# Patient Record
Sex: Female | Born: 1950
Health system: Southern US, Community
[De-identification: ages and names within clinical notes are randomized; demographics above are authoritative.]

## PROBLEM LIST (undated history)

## (undated) DIAGNOSIS — Z952 Presence of prosthetic heart valve: Secondary | ICD-10-CM

## (undated) DIAGNOSIS — J45909 Unspecified asthma, uncomplicated: Secondary | ICD-10-CM

## (undated) DIAGNOSIS — J449 Chronic obstructive pulmonary disease, unspecified: Secondary | ICD-10-CM

## (undated) DIAGNOSIS — I2510 Atherosclerotic heart disease of native coronary artery without angina pectoris: Secondary | ICD-10-CM

## (undated) DIAGNOSIS — Z853 Personal history of malignant neoplasm of breast: Secondary | ICD-10-CM

## (undated) DIAGNOSIS — I10 Essential (primary) hypertension: Secondary | ICD-10-CM

## (undated) DIAGNOSIS — I509 Heart failure, unspecified: Secondary | ICD-10-CM

## (undated) DIAGNOSIS — C50919 Malignant neoplasm of unspecified site of unspecified female breast: Secondary | ICD-10-CM

## (undated) DIAGNOSIS — M1990 Unspecified osteoarthritis, unspecified site: Secondary | ICD-10-CM

## (undated) DIAGNOSIS — K219 Gastro-esophageal reflux disease without esophagitis: Secondary | ICD-10-CM

## (undated) DIAGNOSIS — E785 Hyperlipidemia, unspecified: Secondary | ICD-10-CM

## (undated) DIAGNOSIS — I2699 Other pulmonary embolism without acute cor pulmonale: Secondary | ICD-10-CM

## (undated) HISTORY — DX: Other pulmonary embolism without acute cor pulmonale: I26.99

## (undated) HISTORY — DX: Personal history of malignant neoplasm of breast: Z85.3

## (undated) HISTORY — DX: Presence of prosthetic heart valve: Z95.2

## (undated) HISTORY — DX: Hyperlipidemia, unspecified: E78.5

## (undated) HISTORY — PX: MECHANICAL AORTIC AND MITRAL VALVE REPLACEMENT: SHX2012

## (undated) HISTORY — PX: MITRAL VALVE REPLACEMENT: SHX147

## (undated) HISTORY — PX: MASTECTOMY: SHX3

---

## 1999-06-23 DIAGNOSIS — C50919 Malignant neoplasm of unspecified site of unspecified female breast: Secondary | ICD-10-CM

## 1999-06-23 HISTORY — PX: BREAST SURGERY: SHX581

## 1999-06-23 HISTORY — DX: Malignant neoplasm of unspecified site of unspecified female breast: C50.919

## 2014-11-25 DIAGNOSIS — J029 Acute pharyngitis, unspecified: Secondary | ICD-10-CM | POA: Diagnosis not present

## 2014-11-25 DIAGNOSIS — R05 Cough: Secondary | ICD-10-CM | POA: Diagnosis not present

## 2014-11-25 DIAGNOSIS — R069 Unspecified abnormalities of breathing: Secondary | ICD-10-CM | POA: Diagnosis not present

## 2014-11-25 DIAGNOSIS — Z720 Tobacco use: Secondary | ICD-10-CM | POA: Diagnosis not present

## 2014-11-25 DIAGNOSIS — Z79899 Other long term (current) drug therapy: Secondary | ICD-10-CM | POA: Diagnosis not present

## 2014-11-25 DIAGNOSIS — J440 Chronic obstructive pulmonary disease with acute lower respiratory infection: Secondary | ICD-10-CM | POA: Diagnosis not present

## 2014-11-25 DIAGNOSIS — J441 Chronic obstructive pulmonary disease with (acute) exacerbation: Secondary | ICD-10-CM | POA: Diagnosis not present

## 2014-11-25 DIAGNOSIS — J811 Chronic pulmonary edema: Secondary | ICD-10-CM | POA: Diagnosis not present

## 2014-11-25 DIAGNOSIS — F17200 Nicotine dependence, unspecified, uncomplicated: Secondary | ICD-10-CM | POA: Diagnosis not present

## 2014-11-25 DIAGNOSIS — J209 Acute bronchitis, unspecified: Secondary | ICD-10-CM | POA: Diagnosis not present

## 2014-11-25 DIAGNOSIS — R0602 Shortness of breath: Secondary | ICD-10-CM | POA: Diagnosis not present

## 2015-01-18 DIAGNOSIS — L03031 Cellulitis of right toe: Secondary | ICD-10-CM | POA: Diagnosis not present

## 2015-01-18 DIAGNOSIS — J45909 Unspecified asthma, uncomplicated: Secondary | ICD-10-CM | POA: Diagnosis not present

## 2015-01-18 DIAGNOSIS — S39012A Strain of muscle, fascia and tendon of lower back, initial encounter: Secondary | ICD-10-CM | POA: Diagnosis not present

## 2015-01-18 DIAGNOSIS — Z7901 Long term (current) use of anticoagulants: Secondary | ICD-10-CM | POA: Diagnosis not present

## 2015-01-18 DIAGNOSIS — Z952 Presence of prosthetic heart valve: Secondary | ICD-10-CM | POA: Diagnosis not present

## 2015-01-18 DIAGNOSIS — Z79899 Other long term (current) drug therapy: Secondary | ICD-10-CM | POA: Diagnosis not present

## 2015-01-18 DIAGNOSIS — Z880 Allergy status to penicillin: Secondary | ICD-10-CM | POA: Diagnosis not present

## 2015-01-18 DIAGNOSIS — E785 Hyperlipidemia, unspecified: Secondary | ICD-10-CM | POA: Diagnosis not present

## 2015-01-18 DIAGNOSIS — I10 Essential (primary) hypertension: Secondary | ICD-10-CM | POA: Diagnosis not present

## 2015-03-02 DIAGNOSIS — Z7901 Long term (current) use of anticoagulants: Secondary | ICD-10-CM | POA: Diagnosis not present

## 2015-03-02 DIAGNOSIS — L600 Ingrowing nail: Secondary | ICD-10-CM | POA: Diagnosis not present

## 2015-03-02 DIAGNOSIS — Z79899 Other long term (current) drug therapy: Secondary | ICD-10-CM | POA: Diagnosis not present

## 2015-03-02 DIAGNOSIS — F17210 Nicotine dependence, cigarettes, uncomplicated: Secondary | ICD-10-CM | POA: Diagnosis not present

## 2015-03-02 DIAGNOSIS — B351 Tinea unguium: Secondary | ICD-10-CM | POA: Diagnosis not present

## 2015-03-02 DIAGNOSIS — I4891 Unspecified atrial fibrillation: Secondary | ICD-10-CM | POA: Diagnosis not present

## 2015-03-02 DIAGNOSIS — I10 Essential (primary) hypertension: Secondary | ICD-10-CM | POA: Diagnosis not present

## 2015-03-02 DIAGNOSIS — Z880 Allergy status to penicillin: Secondary | ICD-10-CM | POA: Diagnosis not present

## 2015-03-06 DIAGNOSIS — L600 Ingrowing nail: Secondary | ICD-10-CM | POA: Diagnosis not present

## 2015-03-06 DIAGNOSIS — M79674 Pain in right toe(s): Secondary | ICD-10-CM | POA: Diagnosis not present

## 2015-03-19 DIAGNOSIS — M71071 Abscess of bursa, right ankle and foot: Secondary | ICD-10-CM | POA: Diagnosis not present

## 2015-04-09 DIAGNOSIS — R069 Unspecified abnormalities of breathing: Secondary | ICD-10-CM | POA: Diagnosis not present

## 2015-04-09 DIAGNOSIS — R0602 Shortness of breath: Secondary | ICD-10-CM | POA: Diagnosis not present

## 2015-04-10 DIAGNOSIS — I10 Essential (primary) hypertension: Secondary | ICD-10-CM | POA: Diagnosis not present

## 2015-04-10 DIAGNOSIS — R062 Wheezing: Secondary | ICD-10-CM | POA: Diagnosis not present

## 2015-04-10 DIAGNOSIS — Z7901 Long term (current) use of anticoagulants: Secondary | ICD-10-CM | POA: Diagnosis not present

## 2015-04-10 DIAGNOSIS — J9801 Acute bronchospasm: Secondary | ICD-10-CM | POA: Diagnosis not present

## 2015-04-10 DIAGNOSIS — Z79899 Other long term (current) drug therapy: Secondary | ICD-10-CM | POA: Diagnosis not present

## 2015-05-22 DIAGNOSIS — R0682 Tachypnea, not elsewhere classified: Secondary | ICD-10-CM | POA: Diagnosis not present

## 2015-05-22 DIAGNOSIS — J441 Chronic obstructive pulmonary disease with (acute) exacerbation: Secondary | ICD-10-CM | POA: Diagnosis not present

## 2015-05-22 DIAGNOSIS — I509 Heart failure, unspecified: Secondary | ICD-10-CM | POA: Diagnosis not present

## 2015-05-22 DIAGNOSIS — Z952 Presence of prosthetic heart valve: Secondary | ICD-10-CM | POA: Diagnosis not present

## 2015-05-22 DIAGNOSIS — Z8701 Personal history of pneumonia (recurrent): Secondary | ICD-10-CM | POA: Diagnosis not present

## 2015-05-22 DIAGNOSIS — Z7901 Long term (current) use of anticoagulants: Secondary | ICD-10-CM | POA: Diagnosis not present

## 2015-05-22 DIAGNOSIS — Z853 Personal history of malignant neoplasm of breast: Secondary | ICD-10-CM | POA: Diagnosis not present

## 2015-05-22 DIAGNOSIS — Z79899 Other long term (current) drug therapy: Secondary | ICD-10-CM | POA: Diagnosis not present

## 2015-05-22 DIAGNOSIS — I4581 Long QT syndrome: Secondary | ICD-10-CM | POA: Diagnosis not present

## 2015-05-22 DIAGNOSIS — R0902 Hypoxemia: Secondary | ICD-10-CM | POA: Diagnosis not present

## 2015-05-22 DIAGNOSIS — F17210 Nicotine dependence, cigarettes, uncomplicated: Secondary | ICD-10-CM | POA: Diagnosis not present

## 2015-05-22 DIAGNOSIS — R791 Abnormal coagulation profile: Secondary | ICD-10-CM | POA: Diagnosis not present

## 2015-05-22 DIAGNOSIS — R0600 Dyspnea, unspecified: Secondary | ICD-10-CM | POA: Diagnosis not present

## 2015-05-22 DIAGNOSIS — Z880 Allergy status to penicillin: Secondary | ICD-10-CM | POA: Diagnosis not present

## 2015-05-22 DIAGNOSIS — I10 Essential (primary) hypertension: Secondary | ICD-10-CM | POA: Diagnosis not present

## 2015-05-22 DIAGNOSIS — R0602 Shortness of breath: Secondary | ICD-10-CM | POA: Diagnosis not present

## 2015-05-22 DIAGNOSIS — E785 Hyperlipidemia, unspecified: Secondary | ICD-10-CM | POA: Diagnosis not present

## 2015-05-22 DIAGNOSIS — I4891 Unspecified atrial fibrillation: Secondary | ICD-10-CM | POA: Diagnosis not present

## 2015-06-22 DIAGNOSIS — R9431 Abnormal electrocardiogram [ECG] [EKG]: Secondary | ICD-10-CM | POA: Diagnosis not present

## 2015-06-22 DIAGNOSIS — D72829 Elevated white blood cell count, unspecified: Secondary | ICD-10-CM | POA: Diagnosis not present

## 2015-06-22 DIAGNOSIS — Z720 Tobacco use: Secondary | ICD-10-CM | POA: Diagnosis not present

## 2015-06-22 DIAGNOSIS — D473 Essential (hemorrhagic) thrombocythemia: Secondary | ICD-10-CM | POA: Diagnosis not present

## 2015-06-22 DIAGNOSIS — R0602 Shortness of breath: Secondary | ICD-10-CM | POA: Diagnosis not present

## 2015-06-22 DIAGNOSIS — J441 Chronic obstructive pulmonary disease with (acute) exacerbation: Secondary | ICD-10-CM | POA: Diagnosis not present

## 2015-06-22 DIAGNOSIS — R069 Unspecified abnormalities of breathing: Secondary | ICD-10-CM | POA: Diagnosis not present

## 2015-06-22 DIAGNOSIS — D649 Anemia, unspecified: Secondary | ICD-10-CM | POA: Diagnosis not present

## 2015-06-22 DIAGNOSIS — Z79899 Other long term (current) drug therapy: Secondary | ICD-10-CM | POA: Diagnosis not present

## 2015-06-22 DIAGNOSIS — R062 Wheezing: Secondary | ICD-10-CM | POA: Diagnosis not present

## 2015-06-22 DIAGNOSIS — Z7901 Long term (current) use of anticoagulants: Secondary | ICD-10-CM | POA: Diagnosis not present

## 2015-06-22 DIAGNOSIS — R0902 Hypoxemia: Secondary | ICD-10-CM | POA: Diagnosis not present

## 2015-06-22 DIAGNOSIS — E876 Hypokalemia: Secondary | ICD-10-CM | POA: Diagnosis not present

## 2015-07-03 ENCOUNTER — Emergency Department (HOSPITAL_COMMUNITY)
Admission: EM | Admit: 2015-07-03 | Discharge: 2015-07-03 | Disposition: A | Discharge status: Home / Self Care | Payer: Medicare Other | Attending: Emergency Medicine | Admitting: Emergency Medicine

## 2015-07-03 ENCOUNTER — Encounter (HOSPITAL_COMMUNITY): Payer: Self-pay | Admitting: *Deleted

## 2015-07-03 DIAGNOSIS — M5441 Lumbago with sciatica, right side: Secondary | ICD-10-CM | POA: Diagnosis not present

## 2015-07-03 DIAGNOSIS — Z853 Personal history of malignant neoplasm of breast: Secondary | ICD-10-CM | POA: Diagnosis not present

## 2015-07-03 DIAGNOSIS — I509 Heart failure, unspecified: Secondary | ICD-10-CM | POA: Insufficient documentation

## 2015-07-03 DIAGNOSIS — I10 Essential (primary) hypertension: Secondary | ICD-10-CM | POA: Insufficient documentation

## 2015-07-03 DIAGNOSIS — I2510 Atherosclerotic heart disease of native coronary artery without angina pectoris: Secondary | ICD-10-CM | POA: Diagnosis not present

## 2015-07-03 DIAGNOSIS — F17210 Nicotine dependence, cigarettes, uncomplicated: Secondary | ICD-10-CM | POA: Diagnosis not present

## 2015-07-03 DIAGNOSIS — J449 Chronic obstructive pulmonary disease, unspecified: Secondary | ICD-10-CM | POA: Insufficient documentation

## 2015-07-03 DIAGNOSIS — M545 Low back pain: Secondary | ICD-10-CM | POA: Diagnosis present

## 2015-07-03 HISTORY — DX: Heart failure, unspecified: I50.9

## 2015-07-03 HISTORY — DX: Malignant neoplasm of unspecified site of unspecified female breast: C50.919

## 2015-07-03 HISTORY — DX: Chronic obstructive pulmonary disease, unspecified: J44.9

## 2015-07-03 HISTORY — DX: Essential (primary) hypertension: I10

## 2015-07-03 HISTORY — DX: Atherosclerotic heart disease of native coronary artery without angina pectoris: I25.10

## 2015-07-03 MED ORDER — TRAMADOL HCL 50 MG PO TABS
50.0000 mg | ORAL_TABLET | Freq: Once | ORAL | Status: AC
  Administered 2015-07-03: 50 mg via ORAL
  Filled 2015-07-03: qty 1

## 2015-07-03 NOTE — ED Notes (Addendum)
Pt reports waking up this am with right lower back pain, denies injury but does have hx of back pain. Radiates down her legs, more severe in right leg. Ambulatory at triage. Denies incontinence, denies urinary symptoms.

## 2015-07-03 NOTE — Discharge Instructions (Signed)
Please read attached information. If you experience any new or worsening signs or symptoms please return to the emergency room for evaluation. Please follow-up with your primary care provider or specialist as discussed. Please use medication prescribed only as directed and discontinue taking if you have any concerning signs or symptoms.   °

## 2015-07-03 NOTE — ED Provider Notes (Signed)
CSN: [id number]     Arrival date & time 07/03/15  1209 History  By signing my name below, I, Stephania Fragmin, attest that this documentation has been prepared under the direction and in the presence of [employer], PA-C. Electronically Signed: Stephania Fragmin, ED Scribe. 07/03/2015. 3:32 PM.     Chief Complaint  Patient presents with  . Back Pain   The history is provided by the patient. No language interpreter was used.   HPI Comments: Kim Anthony is a 65 y.o. female with a history of breast cancer, COPD, CAD, CHF, and HTN, who presents to the Emergency Department complaining of atraumatic, constant, aching, 9/10 right lower back pain since waking up this morning, although she has had the same intermittent pain for "a while." She states if she stands for 15-20 minute, the pain starts to radiate either one leg or both of her legs. Patient is able to ambulate with increased discomfort. She had tried lying down to alleviate her pain, with no relief.  Patient has been evaluated for back pain before and states this feels similar to her past back pain. Patient states she used to take Vicodin and Percocet for her back pain with moderate relief in the past, prescribed to her at an ED in [place], although she has since run out. She denies a history of any kidney conditions. She denies abdominal pain.   Past Medical History  Diagnosis Date  . Breast cancer ([place])   . Coronary artery disease   . COPD (chronic obstructive pulmonary disease) ([place])   . CHF (congestive heart failure) ([place])   . Hypertension    History reviewed. No pertinent past surgical history. History reviewed. No pertinent family history. Social History  Substance Use Topics  . Smoking status: Current Every Day Smoker    Types: Cigarettes  . Smokeless tobacco: None  . Alcohol Use: Yes     Comment: beer   OB History    No data available     Review of Systems  A complete 10 system review of systems was obtained and all systems are  negative except as noted in the HPI and PMH.    Allergies  Review of patient's allergies indicates no known allergies.  Home Medications   Prior to Admission medications   Not on File   BP 148/88 mmHg  Pulse 82  Temp(Src) 98.9 F (37.2 C) (Oral)  Resp 20  SpO2 100% Physical Exam  Constitutional: She is oriented to person, place, and time. She appears well-developed and well-nourished. No distress.  HENT:  Head: Normocephalic.  Neck: Normal range of motion. Neck supple.  Pulmonary/Chest: Effort normal.  Musculoskeletal: Normal range of motion. She exhibits tenderness. She exhibits no edema.  TTP over right lateral soft tissue and superior aspect of the gluteus. No C, T, or L spine tenderness to palpation. No obvious signs of trauma, deformity, infection, step-offs. Lung expansion normal. No scoliosis or kyphosis. Bilateral lower extremity strength 5 out of 5, sensation grossly intact, patellar reflexes 2+, pedal pulses 2+, Refill less than 3 seconds.    Straight leg negative Ambulates with steady gait  Neurological: She is alert and oriented to person, place, and time.  Skin: Skin is warm and dry. She is not diaphoretic.  Psychiatric: She has a normal mood and affect. Her behavior is normal. Judgment and thought content normal.  Nursing note and vitals reviewed.   ED Course  Procedures (including critical care time)  DIAGNOSTIC STUDIES: Oxygen Saturation is 98%  on RA, normal by my interpretation.    COORDINATION OF CARE: 3:32 PM - Discussed treatment plan with pt at bedside which includes referral to PCP. Cautioned patient to be careful when leaning forward while standing or straining herself while holding her soon-to-be infant granddaughter. Also advised back exercises, stretching, rest, ice, heat, and Tylenol. Pt verbalized understanding and agreed to plan.   MDM   Final diagnoses:  Right-sided low back pain with right-sided sciatica    Labs:  Imaging:  Consults:  Therapeutics: Tramadol 50 mg, once  Discharge Meds:   Assessment/Plan:  Patient with back pain.  No neurological deficits and normal neuro exam; no red flags. Patient can walk but states is painful.  No loss of bowel or bladder control.  No concern for cauda equina.  No fever, night sweats, weight loss, h/o cancer, IVDU.  RICE protocol and pain medicine (Tramadol administered once here, Tylenol at home) indicated and discussed with patient.    I personally performed the services described in this documentation, which was scribed in my presence. The recorded information has been reviewed and is accurate.     Okey Regal, PA-C 07/03/15 1532  Charlesetta Shanks, MD 07/04/15 1344

## 2015-07-10 ENCOUNTER — Encounter (HOSPITAL_COMMUNITY): Payer: Self-pay

## 2015-07-10 ENCOUNTER — Inpatient Hospital Stay (HOSPITAL_COMMUNITY)
Admission: EM | Admit: 2015-07-10 | Discharge: 2015-07-19 | DRG: 176 | Disposition: A | Discharge status: Home / Self Care | Payer: Medicare Other | Attending: Internal Medicine | Admitting: Internal Medicine

## 2015-07-10 ENCOUNTER — Emergency Department (HOSPITAL_COMMUNITY): Payer: Medicare Other

## 2015-07-10 DIAGNOSIS — R0602 Shortness of breath: Secondary | ICD-10-CM | POA: Diagnosis not present

## 2015-07-10 DIAGNOSIS — E876 Hypokalemia: Secondary | ICD-10-CM | POA: Diagnosis present

## 2015-07-10 DIAGNOSIS — Z952 Presence of prosthetic heart valve: Secondary | ICD-10-CM

## 2015-07-10 DIAGNOSIS — Z7901 Long term (current) use of anticoagulants: Secondary | ICD-10-CM

## 2015-07-10 DIAGNOSIS — I2510 Atherosclerotic heart disease of native coronary artery without angina pectoris: Secondary | ICD-10-CM | POA: Diagnosis present

## 2015-07-10 DIAGNOSIS — D649 Anemia, unspecified: Secondary | ICD-10-CM | POA: Diagnosis present

## 2015-07-10 DIAGNOSIS — J449 Chronic obstructive pulmonary disease, unspecified: Secondary | ICD-10-CM | POA: Diagnosis not present

## 2015-07-10 DIAGNOSIS — Z86718 Personal history of other venous thrombosis and embolism: Secondary | ICD-10-CM

## 2015-07-10 DIAGNOSIS — I50814 Right heart failure due to left heart failure: Secondary | ICD-10-CM

## 2015-07-10 DIAGNOSIS — I110 Hypertensive heart disease with heart failure: Secondary | ICD-10-CM | POA: Diagnosis not present

## 2015-07-10 DIAGNOSIS — Z23 Encounter for immunization: Secondary | ICD-10-CM

## 2015-07-10 DIAGNOSIS — I2699 Other pulmonary embolism without acute cor pulmonale: Principal | ICD-10-CM | POA: Diagnosis present

## 2015-07-10 DIAGNOSIS — I5040 Unspecified combined systolic (congestive) and diastolic (congestive) heart failure: Secondary | ICD-10-CM

## 2015-07-10 DIAGNOSIS — J811 Chronic pulmonary edema: Secondary | ICD-10-CM | POA: Diagnosis not present

## 2015-07-10 DIAGNOSIS — Z853 Personal history of malignant neoplasm of breast: Secondary | ICD-10-CM | POA: Diagnosis not present

## 2015-07-10 DIAGNOSIS — R05 Cough: Secondary | ICD-10-CM | POA: Diagnosis not present

## 2015-07-10 DIAGNOSIS — Z8249 Family history of ischemic heart disease and other diseases of the circulatory system: Secondary | ICD-10-CM

## 2015-07-10 DIAGNOSIS — I5042 Chronic combined systolic (congestive) and diastolic (congestive) heart failure: Secondary | ICD-10-CM | POA: Diagnosis present

## 2015-07-10 DIAGNOSIS — E785 Hyperlipidemia, unspecified: Secondary | ICD-10-CM | POA: Diagnosis present

## 2015-07-10 DIAGNOSIS — R739 Hyperglycemia, unspecified: Secondary | ICD-10-CM | POA: Diagnosis present

## 2015-07-10 DIAGNOSIS — F17210 Nicotine dependence, cigarettes, uncomplicated: Secondary | ICD-10-CM | POA: Diagnosis not present

## 2015-07-10 DIAGNOSIS — I5043 Acute on chronic combined systolic (congestive) and diastolic (congestive) heart failure: Secondary | ICD-10-CM

## 2015-07-10 DIAGNOSIS — G43009 Migraine without aura, not intractable, without status migrainosus: Secondary | ICD-10-CM | POA: Diagnosis present

## 2015-07-10 LAB — CBC
HCT: 35.2 % — ABNORMAL LOW (ref 36.0–46.0)
Hemoglobin: 11.8 g/dL — ABNORMAL LOW (ref 12.0–15.0)
MCH: 32.1 pg (ref 26.0–34.0)
MCHC: 33.5 g/dL (ref 30.0–36.0)
MCV: 95.7 fL (ref 78.0–100.0)
PLATELETS: 318 10*3/uL (ref 150–400)
RBC: 3.68 MIL/uL — ABNORMAL LOW (ref 3.87–5.11)
RDW: 12.2 % (ref 11.5–15.5)
WBC: 8.9 10*3/uL (ref 4.0–10.5)

## 2015-07-10 LAB — I-STAT TROPONIN, ED: Troponin i, poc: 0.01 ng/mL (ref 0.00–0.08)

## 2015-07-10 LAB — BASIC METABOLIC PANEL
ANION GAP: 13 (ref 5–15)
BUN: 8 mg/dL (ref 6–20)
CALCIUM: 9.4 mg/dL (ref 8.9–10.3)
CO2: 23 mmol/L (ref 22–32)
Chloride: 105 mmol/L (ref 101–111)
Creatinine, Ser: 0.71 mg/dL (ref 0.44–1.00)
GFR calc Af Amer: >60 mL/min (ref >60)
GLUCOSE: 108 mg/dL — AB (ref 65–99)
Potassium: 2.9 mmol/L — ABNORMAL LOW (ref 3.5–5.1)
Sodium: 141 mmol/L (ref 135–145)

## 2015-07-10 LAB — PROTIME-INR
INR: 1.1 (ref 0.00–1.49)
Prothrombin Time: 14.4 seconds (ref 11.6–15.2)

## 2015-07-10 LAB — BRAIN NATRIURETIC PEPTIDE: B Natriuretic Peptide: 233.4 pg/mL — ABNORMAL HIGH (ref 0.0–100.0)

## 2015-07-10 NOTE — ED Provider Notes (Signed)
CSN: [id number]     Arrival date & time 07/10/15  2112 History    By signing my name below, I, Rowan Blase, attest that this documentation has been prepared under the direction and in the presence of Orpah Greek, MD . Electronically Signed: Rowan Blase, Scribe. 07/10/2015. 11:53 PM.    Chief Complaint  Patient presents with  . Shortness of Breath   The history is provided by the patient. No language interpreter was used.   HPI Comments:  Kim Anthony is a 65 y.o. female with a Hx of COPD and asthma who presents to the Emergency Department complaining of SOB onset earlier today. Pt reports associated wheezing and chest tightness. She used her inhaler twice today with no improvement.  Pt also states she ran out of Lasix 5 days ago. She currently takes Coumadin for one past blood clot in 2001 when being treated for breast CA. She is a current every day smoker. Pt denies coughing.   Past Medical History  Diagnosis Date  . Breast cancer ([place])   . Coronary artery disease   . COPD (chronic obstructive pulmonary disease) ([place])   . CHF (congestive heart failure) ([place])   . Hypertension    History reviewed. No pertinent past surgical history. No family history on file. Social History  Substance Use Topics  . Smoking status: Current Every Day Smoker    Types: Cigarettes  . Smokeless tobacco: None  . Alcohol Use: Yes     Comment: beer   OB History    No data available     Review of Systems  Respiratory: Positive for chest tightness, shortness of breath and wheezing. Negative for cough.   All other systems reviewed and are negative.   Allergies  Acetaminophen-codeine; Other; and Tramadol  Home Medications   Prior to Admission medications   Medication Sig Start Date End Date Taking? Authorizing Provider  lisinopril (PRINIVIL,ZESTRIL) 10 MG tablet Take 1 tablet by mouth daily. 05/23/15  Yes Historical Provider, MD  furosemide (LASIX) 40 MG tablet Take 1 tablet by  mouth daily. 06/29/15   Historical Provider, MD  simvastatin (ZOCOR) 20 MG tablet Take 1 tablet by mouth daily. 06/29/15   Historical Provider, MD  warfarin (COUMADIN) 4 MG tablet Take 1 tablet by mouth daily. 06/29/15   Historical Provider, MD   BP 172/94 mmHg  Pulse 70  Temp(Src) 98 F (36.7 C) (Oral)  Resp 21  SpO2 98% Physical Exam  Constitutional: She is oriented to person, place, and time. She appears well-developed and well-nourished. No distress.  HENT:  Head: Normocephalic and atraumatic.  Right Ear: Hearing normal.  Left Ear: Hearing normal.  Nose: Nose normal.  Mouth/Throat: Oropharynx is clear and moist and mucous membranes are normal.  Eyes: Conjunctivae and EOM are normal. Pupils are equal, round, and reactive to light.  Neck: Normal range of motion. Neck supple.  Cardiovascular: Regular rhythm, S1 normal and S2 normal.  Exam reveals no gallop and no friction rub.   No murmur heard. Pulmonary/Chest: Effort normal. No respiratory distress. She has rales. She exhibits no tenderness.  Diminished breath sounds with crackles at bases.  Abdominal: Soft. Normal appearance and bowel sounds are normal. There is no hepatosplenomegaly. There is no tenderness. There is no rebound, no guarding, no tenderness at McBurney's point and negative Murphy's sign. No hernia.  Musculoskeletal: Normal range of motion.  Neurological: She is alert and oriented to person, place, and time. She has normal strength. No cranial nerve deficit or  sensory deficit. Coordination normal. GCS eye subscore is 4. GCS verbal subscore is 5. GCS motor subscore is 6.  Skin: Skin is warm, dry and intact. No rash noted. No cyanosis.  Psychiatric: She has a normal mood and affect. Her speech is normal and behavior is normal. Thought content normal.  Nursing note and vitals reviewed.  ED Course  Procedures  DIAGNOSTIC STUDIES:  Oxygen Saturation is 99% on room air, normal by my interpretation.    COORDINATION OF  CARE:  11:34 PM Ordered Chest Xray. Discussed treatment plan with pt at bedside and pt agreed to plan.  Labs Review Labs Reviewed  BASIC METABOLIC PANEL - Abnormal; Notable for the following:    Potassium 2.9 (*)    Glucose, Bld 108 (*)    All other components within normal limits  CBC - Abnormal; Notable for the following:    RBC 3.68 (*)    Hemoglobin 11.8 (*)    HCT 35.2 (*)    All other components within normal limits  BRAIN NATRIURETIC PEPTIDE - Abnormal; Notable for the following:    B Natriuretic Peptide 233.4 (*)    All other components within normal limits  PROTIME-INR  Randolm Idol, ED    Imaging Review Dg Chest 2 View  07/10/2015  CLINICAL DATA:  65 year old female with shortness of breath and cough EXAM: CHEST  2 VIEW COMPARISON:  None. FINDINGS: Two views of the chest demonstrate emphysematous changes of the lungs. There is diffuse interstitial prominence, likely related to underlying chronic lung disease. There is no focal consolidation, pleural effusion, or pneumothorax. Mild cardiomegaly. Median sternotomy wires and mechanical cardiac valve noted. There is osteopenia with degenerative changes of the spine. No acute osseous pathology. Left axillary surgical clips. IMPRESSION: Emphysema.  No focal consolidation or pneumothorax. Electronically Signed   By: Anner Crete M.D.   On: 07/10/2015 21:33   Ct Angio Chest Pe W/cm &/or Wo Cm  07/11/2015  CLINICAL DATA:  65 year old female with shortness of breath and mid chest pain EXAM: CT ANGIOGRAPHY CHEST WITH CONTRAST TECHNIQUE: Multidetector CT imaging of the chest was performed using the standard protocol during bolus administration of intravenous contrast. Multiplanar CT image reconstructions and MIPs were obtained to evaluate the vascular anatomy. CONTRAST:  64mL OMNIPAQUE IOHEXOL 350 MG/ML SOLN COMPARISON:  Radiograph dated 07/10/2015 FINDINGS: Evaluation of this exam is limited due to respiratory motion artifact.  Evaluation is also limited due to streak artifact caused by patient's arms. There is emphysematous changes of the lungs with diffuse interstitial prominence which may represent a degree of congestive changes. There is no pleural effusion or pneumothorax. The central airways are patent. The thoracic aorta appears unremarkable. Evaluation of the pulmonary arteries is limited due to suboptimal opacification of peripheral branches. There is abrupt termination of contrast in the subsegmental branch of the left upper lobe pulmonary artery (series 406, image 72 and coronal series 402 image 86) compatible with pulmonary embolism. A 3.0 x 3.0 cm area of pleural based density at the left a packs is concerning for a focal pulmonary infarct. A small pleural based density in the lingula noted with apparent " Feeding vessel sign" and is also suspicious for a focal area of infarct. No large embolus noted in the lingula, however ; a small distal branch embolus is not excluded. V/Q scan may provide additional information if clinically indicated. No definite large central pulmonary artery embolus identified. Mild cardiomegaly. Mechanical mitral valve. There is coronary vascular calcification. There is dilatation of the  right cardiac chambers with retrograde flow of contrast into the IVC compatible with a degree of right cardiac dysfunction. Underlying right heart straining is not excluded. Correlation with clinical exam and echocardiogram recommended. Top-normal hilar and mediastinal lymph nodes. The esophagus is unremarkable. Left mastectomy changes. Left axillary surgical clips. There is median sternotomy wires. Degenerative changes of the spine. T8 probable hemangioma. No acute fracture. The visualized upper abdomen is unremarkable. Review of the MIP images confirms the above findings. IMPRESSION: Small subsegmental left upper lobe pulmonary embolus with findings suspicious for probable small embolus in the distal branches of the  lingula. Left apical and lingular subpleural densities are concerning for pulmonary infarct. Clinical correlation is recommended. V/Q scan may provide additional information. Mild dilatation of the right cardiac chambers likely related to underlying right cardiac dysfunction. Right heart straining is not excluded. Correlation with clinical exam and echocardiogram recommended. These results were called by telephone at the time of interpretation on 07/11/2015 at 6:11 am to Dr. Joseph Berkshire , who verbally acknowledged these results. Electronically Signed   By: Anner Crete M.D.   On: 07/11/2015 06:15   I have personally reviewed and evaluated these images and lab results as part of my medical decision-making.   EKG Interpretation   Date/Time:  Wednesday July 10 2015 21:22:01 EST Ventricular Rate:  81 PR Interval:  144 QRS Duration: 104 QT Interval:  426 QTC Calculation: 494 R Axis:   0 Text Interpretation:  Normal sinus rhythm ST \\T \ T wave abnormality,  consider lateral ischemia Prolonged QT Abnormal ECG No previous tracing  Confirmed by POLLINA  MD, Annetta North [id number]) on 07/10/2015 11:30:40 PM      MDM   Final diagnoses:  PE (pulmonary embolism)  Pulmonary infarct ([place])   Presents to the emergency department for evaluation of shortness of breath. Patient does have a history of COPD and asthma. She did not, however, have significant wheezing present on arrival. She is not hypoxic. Patient also reports a previous history of DVT and takes Lasix. She has run out of her Coumadin and Lasix recently. Her INR is severely subtherapeutic. Stopped this, PE was also considered. There is no evidence of congestive heart failure. Patient underwent CT angiography and this did show evidence of pulmonary infarct and PE. No evidence of right heart strain.  I personally performed the services described in this documentation, which was scribed in my presence. The recorded information has been  reviewed and is accurate.    Orpah Greek, MD 07/11/15 [phone]

## 2015-07-10 NOTE — ED Notes (Signed)
Pt here for SOB, hx of COPD and asthma. Lungs sounds clear. RR unlabored, pt speaking clear complete sentences. States she ran out of her lasix, zocor and coumadin. Pt reports she gave herself 2 at home breathing treatments today.

## 2015-07-11 ENCOUNTER — Encounter (HOSPITAL_COMMUNITY): Payer: Self-pay | Admitting: Family Medicine

## 2015-07-11 ENCOUNTER — Emergency Department (HOSPITAL_COMMUNITY): Payer: Medicare Other

## 2015-07-11 DIAGNOSIS — E785 Hyperlipidemia, unspecified: Secondary | ICD-10-CM

## 2015-07-11 DIAGNOSIS — I5043 Acute on chronic combined systolic (congestive) and diastolic (congestive) heart failure: Secondary | ICD-10-CM

## 2015-07-11 DIAGNOSIS — Z23 Encounter for immunization: Secondary | ICD-10-CM | POA: Diagnosis not present

## 2015-07-11 DIAGNOSIS — Z853 Personal history of malignant neoplasm of breast: Secondary | ICD-10-CM | POA: Diagnosis not present

## 2015-07-11 DIAGNOSIS — Z86718 Personal history of other venous thrombosis and embolism: Secondary | ICD-10-CM | POA: Diagnosis not present

## 2015-07-11 DIAGNOSIS — I2699 Other pulmonary embolism without acute cor pulmonale: Principal | ICD-10-CM

## 2015-07-11 DIAGNOSIS — Z8249 Family history of ischemic heart disease and other diseases of the circulatory system: Secondary | ICD-10-CM | POA: Diagnosis not present

## 2015-07-11 DIAGNOSIS — D649 Anemia, unspecified: Secondary | ICD-10-CM | POA: Diagnosis present

## 2015-07-11 DIAGNOSIS — G43009 Migraine without aura, not intractable, without status migrainosus: Secondary | ICD-10-CM

## 2015-07-11 DIAGNOSIS — Z952 Presence of prosthetic heart valve: Secondary | ICD-10-CM | POA: Diagnosis not present

## 2015-07-11 DIAGNOSIS — I110 Hypertensive heart disease with heart failure: Secondary | ICD-10-CM | POA: Diagnosis not present

## 2015-07-11 DIAGNOSIS — I5042 Chronic combined systolic (congestive) and diastolic (congestive) heart failure: Secondary | ICD-10-CM

## 2015-07-11 DIAGNOSIS — I509 Heart failure, unspecified: Secondary | ICD-10-CM

## 2015-07-11 DIAGNOSIS — Z954 Presence of other heart-valve replacement: Secondary | ICD-10-CM | POA: Diagnosis not present

## 2015-07-11 DIAGNOSIS — E876 Hypokalemia: Secondary | ICD-10-CM

## 2015-07-11 DIAGNOSIS — R739 Hyperglycemia, unspecified: Secondary | ICD-10-CM | POA: Diagnosis present

## 2015-07-11 DIAGNOSIS — G43001 Migraine without aura, not intractable, with status migrainosus: Secondary | ICD-10-CM | POA: Diagnosis not present

## 2015-07-11 DIAGNOSIS — F17210 Nicotine dependence, cigarettes, uncomplicated: Secondary | ICD-10-CM | POA: Diagnosis not present

## 2015-07-11 DIAGNOSIS — I10 Essential (primary) hypertension: Secondary | ICD-10-CM | POA: Diagnosis not present

## 2015-07-11 DIAGNOSIS — R0602 Shortness of breath: Secondary | ICD-10-CM | POA: Diagnosis not present

## 2015-07-11 DIAGNOSIS — I5020 Unspecified systolic (congestive) heart failure: Secondary | ICD-10-CM | POA: Diagnosis not present

## 2015-07-11 DIAGNOSIS — I50814 Right heart failure due to left heart failure: Secondary | ICD-10-CM

## 2015-07-11 DIAGNOSIS — Z7901 Long term (current) use of anticoagulants: Secondary | ICD-10-CM | POA: Diagnosis not present

## 2015-07-11 DIAGNOSIS — I2510 Atherosclerotic heart disease of native coronary artery without angina pectoris: Secondary | ICD-10-CM | POA: Diagnosis present

## 2015-07-11 DIAGNOSIS — J449 Chronic obstructive pulmonary disease, unspecified: Secondary | ICD-10-CM | POA: Diagnosis not present

## 2015-07-11 DIAGNOSIS — I5040 Unspecified combined systolic (congestive) and diastolic (congestive) heart failure: Secondary | ICD-10-CM

## 2015-07-11 DIAGNOSIS — G43019 Migraine without aura, intractable, without status migrainosus: Secondary | ICD-10-CM | POA: Diagnosis not present

## 2015-07-11 HISTORY — DX: Migraine without aura, not intractable, without status migrainosus: G43.009

## 2015-07-11 LAB — MAGNESIUM: Magnesium: 1.9 mg/dL (ref 1.7–2.4)

## 2015-07-11 LAB — PHOSPHORUS: Phosphorus: 2.8 mg/dL (ref 2.5–4.6)

## 2015-07-11 MED ORDER — SODIUM CHLORIDE 0.9 % IV SOLN: 250.0000 mL | INTRAVENOUS | Status: DC | PRN

## 2015-07-11 MED ORDER — HEPARIN (PORCINE) IN NACL 100-0.45 UNIT/ML-% IJ SOLN
1100.0000 [IU]/h | INTRAMUSCULAR | Status: AC
  Filled 2015-07-11: qty 250

## 2015-07-11 MED ORDER — HEPARIN BOLUS VIA INFUSION
4000.0000 [IU] | Freq: Once | INTRAVENOUS | Status: AC
  Administered 2015-07-11: 4000 [IU] via INTRAVENOUS
  Filled 2015-07-11: qty 4000

## 2015-07-11 MED ORDER — HEPARIN (PORCINE) IN NACL 100-0.45 UNIT/ML-% IJ SOLN
1100.0000 [IU]/h | INTRAMUSCULAR | Status: DC
  Administered 2015-07-11: 1100 [IU]/h via INTRAVENOUS
  Filled 2015-07-11: qty 250

## 2015-07-11 MED ORDER — OXYCODONE HCL 5 MG PO TABS
5.0000 mg | ORAL_TABLET | Freq: Two times a day (BID) | ORAL | Status: DC | PRN
  Administered 2015-07-11 – 2015-07-13 (×4): 5 mg via ORAL
  Filled 2015-07-11 (×5): qty 1

## 2015-07-11 MED ORDER — ALBUTEROL SULFATE (2.5 MG/3ML) 0.083% IN NEBU: 2.5000 mg | INHALATION_SOLUTION | RESPIRATORY_TRACT | Status: AC | PRN

## 2015-07-11 MED ORDER — ACETAMINOPHEN 650 MG RE SUPP: 650.0000 mg | Freq: Four times a day (QID) | RECTAL | Status: DC | PRN

## 2015-07-11 MED ORDER — ONDANSETRON HCL 4 MG/2ML IJ SOLN: 4.0000 mg | Freq: Four times a day (QID) | INTRAMUSCULAR | Status: DC | PRN

## 2015-07-11 MED ORDER — ENOXAPARIN SODIUM 80 MG/0.8ML ~~LOC~~ SOLN
1.0000 mg/kg | Freq: Two times a day (BID) | SUBCUTANEOUS | Status: DC
  Administered 2015-07-11 – 2015-07-12 (×2): 75 mg via SUBCUTANEOUS
  Filled 2015-07-11 (×2): qty 0.8

## 2015-07-11 MED ORDER — WARFARIN - PHARMACIST DOSING INPATIENT
Freq: Every day | Status: DC
  Administered 2015-07-12 – 2015-07-14 (×2)

## 2015-07-11 MED ORDER — SODIUM CHLORIDE 0.9 % IJ SOLN: 3.0000 mL | INTRAMUSCULAR | Status: DC | PRN

## 2015-07-11 MED ORDER — POTASSIUM CHLORIDE 10 MEQ/100ML IV SOLN
10.0000 meq | INTRAVENOUS | Status: AC
Start: 2015-07-11 — End: 2015-07-11
  Administered 2015-07-11 (×4): 10 meq via INTRAVENOUS
  Filled 2015-07-11 (×4): qty 100

## 2015-07-11 MED ORDER — ACETAMINOPHEN 500 MG PO TABS
1000.0000 mg | ORAL_TABLET | Freq: Four times a day (QID) | ORAL | Status: DC | PRN
  Administered 2015-07-11 – 2015-07-19 (×27): 1000 mg via ORAL
  Filled 2015-07-11 (×27): qty 2

## 2015-07-11 MED ORDER — DEXTROSE 5 % IV SOLN
1000.0000 mg | Freq: Three times a day (TID) | INTRAVENOUS | Status: DC | PRN
  Administered 2015-07-12 – 2015-07-13 (×4): 1000 mg via INTRAVENOUS
  Filled 2015-07-11 (×10): qty 10

## 2015-07-11 MED ORDER — POTASSIUM CHLORIDE CRYS ER 20 MEQ PO TBCR
40.0000 meq | EXTENDED_RELEASE_TABLET | Freq: Once | ORAL | Status: AC
  Administered 2015-07-11: 40 meq via ORAL
  Filled 2015-07-11: qty 2

## 2015-07-11 MED ORDER — POTASSIUM CHLORIDE CRYS ER 20 MEQ PO TBCR
40.0000 meq | EXTENDED_RELEASE_TABLET | Freq: Two times a day (BID) | ORAL | Status: DC
  Administered 2015-07-11 – 2015-07-19 (×17): 40 meq via ORAL
  Filled 2015-07-11 (×17): qty 2

## 2015-07-11 MED ORDER — SIMVASTATIN 20 MG PO TABS
20.0000 mg | ORAL_TABLET | Freq: Every day | ORAL | Status: DC
  Administered 2015-07-11 – 2015-07-19 (×9): 20 mg via ORAL
  Filled 2015-07-11 (×9): qty 1

## 2015-07-11 MED ORDER — HEPARIN (PORCINE) IN NACL 100-0.45 UNIT/ML-% IJ SOLN
1100.0000 [IU]/h | INTRAMUSCULAR | Status: DC
  Filled 2015-07-11: qty 250

## 2015-07-11 MED ORDER — SODIUM CHLORIDE 0.9 % IJ SOLN
3.0000 mL | Freq: Two times a day (BID) | INTRAMUSCULAR | Status: DC
Start: 2015-07-11 — End: 2015-07-13

## 2015-07-11 MED ORDER — LISINOPRIL 10 MG PO TABS
10.0000 mg | ORAL_TABLET | Freq: Every day | ORAL | Status: DC
  Administered 2015-07-11 – 2015-07-12 (×2): 10 mg via ORAL
  Filled 2015-07-11 (×2): qty 1

## 2015-07-11 MED ORDER — SODIUM CHLORIDE 0.9 % IJ SOLN
3.0000 mL | Freq: Two times a day (BID) | INTRAMUSCULAR | Status: DC
  Administered 2015-07-11 – 2015-07-19 (×5): 3 mL via INTRAVENOUS

## 2015-07-11 MED ORDER — ONDANSETRON HCL 4 MG PO TABS: 4.0000 mg | ORAL_TABLET | Freq: Four times a day (QID) | ORAL | Status: DC | PRN

## 2015-07-11 MED ORDER — FUROSEMIDE 40 MG PO TABS
40.0000 mg | ORAL_TABLET | Freq: Every day | ORAL | Status: DC
  Administered 2015-07-11 – 2015-07-19 (×9): 40 mg via ORAL
  Filled 2015-07-11 (×3): qty 1
  Filled 2015-07-11: qty 2
  Filled 2015-07-11 (×5): qty 1

## 2015-07-11 MED ORDER — IOHEXOL 350 MG/ML SOLN
70.0000 mL | Freq: Once | INTRAVENOUS | Status: AC | PRN
Start: 2015-07-11 — End: 2015-07-11
  Administered 2015-07-11: 70 mL via INTRAVENOUS

## 2015-07-11 MED ORDER — WARFARIN SODIUM 3 MG PO TABS
6.0000 mg | ORAL_TABLET | Freq: Once | ORAL | Status: AC
  Administered 2015-07-11: 6 mg via ORAL
  Filled 2015-07-11: qty 1

## 2015-07-11 MED ORDER — NICOTINE 14 MG/24HR TD PT24
14.0000 mg | MEDICATED_PATCH | Freq: Every day | TRANSDERMAL | Status: DC
  Administered 2015-07-11 – 2015-07-19 (×9): 14 mg via TRANSDERMAL
  Filled 2015-07-11 (×9): qty 1

## 2015-07-11 NOTE — ED Notes (Signed)
Ordered pt heart healthy meal tray for pt's lunch

## 2015-07-11 NOTE — ED Notes (Signed)
Attempted IV stick X 2  

## 2015-07-11 NOTE — Progress Notes (Addendum)
Handover received from Dr. Betsey Holiday. 65 year old female with past medical history of DVT and heart failure;  who presents with shortness of breath. Reports being out of her Coumadin and Lasix. CT with PE protocol positive for acute clot. Vital signs otherwise stable with no signs of heart strain per CT. Admitted to telemetry telemetry bed and heparin drip ordered.

## 2015-07-11 NOTE — Progress Notes (Signed)
ANTICOAGULATION CONSULT NOTE - Initial Consult  Pharmacy Consult for LMWH and coumadin Indication: PE and mechanical valve  Allergies  Allergen Reactions  . Acetaminophen-Codeine Itching  . Other Itching    Darvocet  . Tramadol Itching    vomitting    Patient Measurements: Height: 5\' 5"  (165.1 cm) Weight: 160 lb (72.576 kg) IBW/kg (Calculated) : 57   Vital Signs: Temp: 98 F (36.7 C) (01/18 2328) Temp Source: Oral (01/18 2328) BP: 152/98 mmHg (01/19 0930) Pulse Rate: 78 (01/19 0930)  Labs:  Recent Labs  07/10/15 2133  HGB 11.8*  HCT 35.2*  PLT 318  LABPROT 14.4  INR 1.10  CREATININE 0.71    Estimated Creatinine Clearance: 69.9 mL/min (by C-G formula based on Cr of 0.71).   Medical History: Past Medical History  Diagnosis Date  . Breast cancer ([place])   . Coronary artery disease   . COPD (chronic obstructive pulmonary disease) ([place])   . CHF (congestive heart failure) ([place])   . Hypertension      Assessment:  65 yo F with CTA showing small subsegmental LUL PE on heparin per pharamcy. Pharmacy consulted to change UFH  LMWH/coum for PE and mechanical valve.  I interviewed pt - she has a "mechanical valve" does not know what kind of valve - says her home dose is coum 4 mg but ran out 5 days ago.  Admission INR baseline at 1.1. CBC OK, no bleeding reported.  She has gotten heparin bolus 4K at 0716 and hep drip at 0716 am running at 1100 units/hr.    Goal of Therapy:  INR 2-3 Anti-Xa level 0.6-1 units/ml 4hrs after LMWH dose given Monitor platelets by anticoagulation protocol: Yes   Plan:  - continue heparin drip at 1100 units/hr until 1800 then stop - start LMWH 1 mg/kg sq q12h at 1800 = 75 sq q12h - coumadin 6 mg po x 1 dose ( 1.5 x home dose) - daily INR - f/u CBC, renal fxn  Eudelia Bunch, Pharm.D. BP:[id number] 07/11/2015 10:30 AM

## 2015-07-11 NOTE — H&P (Addendum)
[facility] History and Physical  [name] GEX:[id number] DOB: [birth year] DOA: 07/10/2015  Referring physician: Dr. Blinda Leatherwood - [facility] PCP: No PCP Per Patient   Chief Complaint: SOB  HPI: Kim Anthony is a 65 y.o. female  Shortness of breath. Sudden onset. Associated with chest tightness/pressure and headache. Inhalers without improvement. Symptoms started approximately 12-18 hours ago. Patient states that she has not been taking her Coumadin or Lasix for the last 5 days that she ran out of the medications. Continues to smoke every day. Symptoms are constant but are not that are worse since onset. Symptoms are aggravated with exertion. No alleviating factors.    Review of Systems:  Constitutional:  No weight loss, night sweats, Fevers, chills, fatigue.  HEENT:  No headaches, Difficulty swallowing,Tooth/dental problems,Sore throat, Cardio-vascular:  Per HPI GI:  No heartburn, indigestion, abdominal pain, nausea, vomiting, diarrhea, change in bowel habits, loss of appetite  Resp: Per HPI Skin:  no rash or lesions.  GU:  no dysuria, change in color of urine, no urgency or frequency. No flank pain.  Musculoskeletal:   No joint pain or swelling. No decreased range of motion. No back pain.  Psych:  No change in mood or affect. No depression or anxiety. No memory loss.  Neuro:  No change in sensation, unilateral strength, or cognitive abilities  All other systems were reviewed and are negative.  Past Medical History  Diagnosis Date  . Breast cancer (HCC)   . Coronary artery disease   . COPD (chronic obstructive pulmonary disease) (HCC)   . CHF (congestive heart failure) (HCC)   . Hypertension    Past Surgical History  Procedure Laterality Date  . Mastectomy    . Valve replacement     Social History:  reports that she has been smoking Cigarettes.  She does not have any smokeless tobacco history on file. She reports that she drinks alcohol. She reports that she does not  use illicit drugs.  Allergies  Allergen Reactions  . Acetaminophen-Codeine Itching  . Other Itching    Darvocet  . Tramadol Itching    vomitting    Family History  Problem Relation Age of Onset  . Heart attack       Prior to Admission medications   Medication Sig Start Date End Date Taking? Authorizing Provider  lisinopril (PRINIVIL,ZESTRIL) 10 MG tablet Take 1 tablet by mouth daily. 05/23/15  Yes Historical Provider, MD  furosemide (LASIX) 40 MG tablet Take 1 tablet by mouth daily. 06/29/15   Historical Provider, MD  simvastatin (ZOCOR) 20 MG tablet Take 1 tablet by mouth daily. 06/29/15   Historical Provider, MD  warfarin (COUMADIN) 4 MG tablet Take 1 tablet by mouth daily. 06/29/15   Historical Provider, MD   Physical Exam: Filed Vitals:   07/11/15 0630 07/11/15 0700 07/11/15 0715 07/11/15 0745  BP: 160/77 166/90 147/89 159/93  Pulse: 72 71 81 78  Temp:      TempSrc:      Resp: 21 19 21 19   Height: 5\' 5"  (1.651 m)     Weight: 72.576 kg (160 lb)     SpO2: 99% 98% 97% 100%    Wt Readings from Last 3 Encounters:  07/11/15 72.576 kg (160 lb)    General:  Appears calm and comfortable Eyes:  PERRL, EOMI, normal lids, iris ENT:  grossly normal hearing, lips & tongue Neck:  no LAD, masses or thyromegaly Cardiovascular:  RRR, no m/r/g. 1+ LE edema.  Respiratory:  CTA bilaterally, no w/r/r. Normal  respiratory effort. Abdomen:  soft, ntnd Skin:  no rash or induration seen on limited exam Musculoskeletal:  grossly normal tone BUE/BLE Psychiatric:  grossly normal mood and affect, speech fluent and appropriate Neurologic:  CN 2-12 grossly intact, moves all extremities in coordinated fashion.          Labs on Admission:  Basic Metabolic Panel:  Recent Labs Lab 07/10/15 2133  NA 141  K 2.9*  CL 105  CO2 23  GLUCOSE 108*  BUN 8  CREATININE 0.71  CALCIUM 9.4   Liver Function Tests: No results for input(s): AST, ALT, ALKPHOS, BILITOT, PROT, ALBUMIN in the last 168  hours. No results for input(s): LIPASE, AMYLASE in the last 168 hours. No results for input(s): AMMONIA in the last 168 hours. CBC:  Recent Labs Lab 07/10/15 2133  WBC 8.9  HGB 11.8*  HCT 35.2*  MCV 95.7  PLT 318   Cardiac Enzymes: No results for input(s): CKTOTAL, CKMB, CKMBINDEX, TROPONINI in the last 168 hours.  BNP (last 3 results)  Recent Labs  07/10/15 2133  BNP 233.4*    ProBNP (last 3 results) No results for input(s): PROBNP in the last 8760 hours.   CREATININE: 0.71 (07/10/15 2133) Estimated creatinine clearance - 69.9 mL/min  CBG: No results for input(s): GLUCAP in the last 168 hours.  Radiological Exams on Admission: Dg Chest 2 View  07/10/2015  CLINICAL DATA:  65 year old female with shortness of breath and cough EXAM: CHEST  2 VIEW COMPARISON:  None. FINDINGS: Two views of the chest demonstrate emphysematous changes of the lungs. There is diffuse interstitial prominence, likely related to underlying chronic lung disease. There is no focal consolidation, pleural effusion, or pneumothorax. Mild cardiomegaly. Median sternotomy wires and mechanical cardiac valve noted. There is osteopenia with degenerative changes of the spine. No acute osseous pathology. Left axillary surgical clips. IMPRESSION: Emphysema.  No focal consolidation or pneumothorax. Electronically Signed   By: Elgie Collard M.D.   On: 07/10/2015 21:33   Ct Angio Chest Pe W/cm &/or Wo Cm  07/11/2015  CLINICAL DATA:  65 year old female with shortness of breath and mid chest pain EXAM: CT ANGIOGRAPHY CHEST WITH CONTRAST TECHNIQUE: Multidetector CT imaging of the chest was performed using the standard protocol during bolus administration of intravenous contrast. Multiplanar CT image reconstructions and MIPs were obtained to evaluate the vascular anatomy. CONTRAST:  70mL OMNIPAQUE IOHEXOL 350 MG/ML SOLN COMPARISON:  Radiograph dated 07/10/2015 FINDINGS: Evaluation of this exam is limited due to  respiratory motion artifact. Evaluation is also limited due to streak artifact caused by patient's arms. There is emphysematous changes of the lungs with diffuse interstitial prominence which may represent a degree of congestive changes. There is no pleural effusion or pneumothorax. The central airways are patent. The thoracic aorta appears unremarkable. Evaluation of the pulmonary arteries is limited due to suboptimal opacification of peripheral branches. There is abrupt termination of contrast in the subsegmental branch of the left upper lobe pulmonary artery (series 406, image 72 and coronal series 402 image 86) compatible with pulmonary embolism. A 3.0 x 3.0 cm area of pleural based density at the left a packs is concerning for a focal pulmonary infarct. A small pleural based density in the lingula noted with apparent " Feeding vessel sign" and is also suspicious for a focal area of infarct. No large embolus noted in the lingula, however ; a small distal branch embolus is not excluded. V/Q scan may provide additional information if clinically indicated. No definite large central  pulmonary artery embolus identified. Mild cardiomegaly. Mechanical mitral valve. There is coronary vascular calcification. There is dilatation of the right cardiac chambers with retrograde flow of contrast into the IVC compatible with a degree of right cardiac dysfunction. Underlying right heart straining is not excluded. Correlation with clinical exam and echocardiogram recommended. Top-normal hilar and mediastinal lymph nodes. The esophagus is unremarkable. Left mastectomy changes. Left axillary surgical clips. There is median sternotomy wires. Degenerative changes of the spine. T8 probable hemangioma. No acute fracture. The visualized upper abdomen is unremarkable. Review of the MIP images confirms the above findings. IMPRESSION: Small subsegmental left upper lobe pulmonary embolus with findings suspicious for probable small embolus  in the distal branches of the lingula. Left apical and lingular subpleural densities are concerning for pulmonary infarct. Clinical correlation is recommended. V/Q scan may provide additional information. Mild dilatation of the right cardiac chambers likely related to underlying right cardiac dysfunction. Right heart straining is not excluded. Correlation with clinical exam and echocardiogram recommended. These results were called by telephone at the time of interpretation on 07/11/2015 at 6:11 am to Dr. Jaci Carrel , who verbally acknowledged these results. Electronically Signed   By: Elgie Collard M.D.   On: 07/11/2015 06:15     Assessment/Plan Active Problems:   PE (pulmonary embolism)   Chronic heart failure (HCC)   Headache, common migraine   HLD (hyperlipidemia)   Hypokalemia  PE: CTA showing small subsegmental LUL PE. INR 1.1. Placed on Heparin in ED. Hemodynamically stable. Pt w/ mechanical valve so will need to continue warfarin. Trop neg. EKG w/o ACS.  - Tele - DC heparin adn start lovenox and restart coumadin  CHF: No acute decompensation. Mild elevation in BNP likely secondary to right heart strain from PE.  - continue lasix - Echo  Headache: intermittent and chronic for pt. Exacerbated by PE/stress. Tramadol and hydrocodone "allergy" - Tylenol - Robaxin - consider migraine cocktail if not improving - decadron, benadryl, imitrex, etc  HLD: - continue simvastatin  HypoKalemia: 2.9,. On lasix - Mag - Replete w/ Kdur and KCL  Tobacco:  - nicotine patch   Code Status: FULL  DVT Prophylaxis: lovenox and coumadin Family Communication: none Disposition Plan: Pending Improvement    Jahzara Slattery Shela Commons, MD Family Medicine [facility] [email or web address] Password [email or web address]

## 2015-07-11 NOTE — Progress Notes (Signed)
ANTICOAGULATION CONSULT NOTE - Initial Consult  Pharmacy Consult for heparin Indication: pulmonary embolus  Allergies  Allergen Reactions  . Acetaminophen-Codeine Itching  . Other Itching    Darvocet  . Tramadol Itching    vomitting    Patient Measurements: Height: 5\' 5"  (165.1 cm) Weight: 160 lb (72.576 kg) IBW/kg (Calculated) : 57  Vital Signs: Temp: 98 F (36.7 C) (01/18 2328) Temp Source: Oral (01/18 2328) BP: 160/77 mmHg (01/19 0630) Pulse Rate: 72 (01/19 0630)  Labs:  Recent Labs  07/10/15 2133  HGB 11.8*  HCT 35.2*  PLT 318  LABPROT 14.4  INR 1.10  CREATININE 0.71    Estimated Creatinine Clearance: 69.9 mL/min (by C-G formula based on Cr of 0.71).   Medical History: Past Medical History  Diagnosis Date  . Breast cancer ([place])   . Coronary artery disease   . COPD (chronic obstructive pulmonary disease) ([place])   . CHF (congestive heart failure) ([place])   . Hypertension     Assessment: 65yo female c/o SOB despite two breathing txs at home, on Coumadin for h/o DVT but reports has been out of Coumadin and INR is 1.1 basically at baseline, CT confirms PE, to begin heparin.  Goal of Therapy:  Heparin level 0.3-0.7 units/ml Monitor platelets by anticoagulation protocol: Yes   Plan:  Will give heparin 4000 units IV bolus followed by gtt at 1100 units/hr and monitor heparin levels and CBC.  Wynona Neat, PharmD, BCPS  07/11/2015,6:45 AM

## 2015-07-11 NOTE — ED Notes (Signed)
Ordered heart healthy meal tray for pt. 

## 2015-07-12 ENCOUNTER — Inpatient Hospital Stay (HOSPITAL_COMMUNITY): Payer: Medicare Other

## 2015-07-12 DIAGNOSIS — Z954 Presence of other heart-valve replacement: Secondary | ICD-10-CM

## 2015-07-12 DIAGNOSIS — G43001 Migraine without aura, not intractable, with status migrainosus: Secondary | ICD-10-CM

## 2015-07-12 DIAGNOSIS — I2699 Other pulmonary embolism without acute cor pulmonale: Secondary | ICD-10-CM

## 2015-07-12 DIAGNOSIS — I5020 Unspecified systolic (congestive) heart failure: Secondary | ICD-10-CM

## 2015-07-12 LAB — BASIC METABOLIC PANEL
ANION GAP: 10 (ref 5–15)
BUN: 9 mg/dL (ref 6–20)
CALCIUM: 8.8 mg/dL — AB (ref 8.9–10.3)
CO2: 20 mmol/L — ABNORMAL LOW (ref 22–32)
CREATININE: 0.58 mg/dL (ref 0.44–1.00)
Chloride: 109 mmol/L (ref 101–111)
GFR calc Af Amer: >60 mL/min (ref >60)
Glucose, Bld: 122 mg/dL — ABNORMAL HIGH (ref 65–99)
Potassium: 3.9 mmol/L (ref 3.5–5.1)
Sodium: 139 mmol/L (ref 135–145)

## 2015-07-12 LAB — PROTIME-INR
INR: 1.1 (ref 0.00–1.49)
PROTHROMBIN TIME: 14.4 s (ref 11.6–15.2)

## 2015-07-12 LAB — CBC
HEMATOCRIT: 35.4 % — AB (ref 36.0–46.0)
Hemoglobin: 11 g/dL — ABNORMAL LOW (ref 12.0–15.0)
MCH: 30.8 pg (ref 26.0–34.0)
MCHC: 31.1 g/dL (ref 30.0–36.0)
MCV: 99.2 fL (ref 78.0–100.0)
PLATELETS: 284 10*3/uL (ref 150–400)
RBC: 3.57 MIL/uL — ABNORMAL LOW (ref 3.87–5.11)
RDW: 12.2 % (ref 11.5–15.5)
WBC: 7.7 10*3/uL (ref 4.0–10.5)

## 2015-07-12 MED ORDER — WARFARIN SODIUM 3 MG PO TABS
6.0000 mg | ORAL_TABLET | Freq: Once | ORAL | Status: AC
  Administered 2015-07-12: 6 mg via ORAL
  Filled 2015-07-12: qty 2

## 2015-07-12 MED ORDER — KETOROLAC TROMETHAMINE 15 MG/ML IJ SOLN
15.0000 mg | Freq: Four times a day (QID) | INTRAMUSCULAR | Status: AC | PRN
  Administered 2015-07-12 – 2015-07-14 (×7): 15 mg via INTRAVENOUS
  Filled 2015-07-12 (×10): qty 1

## 2015-07-12 MED ORDER — METOPROLOL TARTRATE 25 MG PO TABS
25.0000 mg | ORAL_TABLET | Freq: Two times a day (BID) | ORAL | Status: DC
  Administered 2015-07-12 – 2015-07-19 (×15): 25 mg via ORAL
  Filled 2015-07-12 (×8): qty 1
  Filled 2015-07-12: qty 2
  Filled 2015-07-12 (×6): qty 1

## 2015-07-12 MED ORDER — HEPARIN (PORCINE) IN NACL 100-0.45 UNIT/ML-% IJ SOLN
1350.0000 [IU]/h | INTRAMUSCULAR | Status: DC
Start: 2015-07-12 — End: 2015-07-18
  Administered 2015-07-12: 1150 [IU]/h via INTRAVENOUS
  Administered 2015-07-13 – 2015-07-18 (×7): 1350 [IU]/h via INTRAVENOUS
  Filled 2015-07-12 (×8): qty 250

## 2015-07-12 MED ORDER — LISINOPRIL 20 MG PO TABS
20.0000 mg | ORAL_TABLET | Freq: Every day | ORAL | Status: DC
  Administered 2015-07-13 – 2015-07-19 (×7): 20 mg via ORAL
  Filled 2015-07-12 (×7): qty 1

## 2015-07-12 NOTE — Progress Notes (Addendum)
ANTICOAGULATION CONSULT NOTE - Follow Up Consult  Pharmacy Consult for Coumadin; switch Lovenox to Heparin Indication: hx DVT, mechanical valve and new PE  Allergies  Allergen Reactions  . Acetaminophen-Codeine Itching  . Other Itching    Darvocet  . Tramadol Itching    vomitting    Patient Measurements: Height: 5\' 5"  (165.1 cm) Weight: 161 lb 6.4 oz (73.211 kg) (Scale B) IBW/kg (Calculated) : 57  Vital Signs: Temp: 98 F (36.7 C) (01/20 1255) Temp Source: Oral (01/20 1255) BP: 160/78 mmHg (01/20 1255) Pulse Rate: 70 (01/20 1255)  Labs:  Recent Labs  07/10/15 2133 07/12/15 0314  HGB 11.8* 11.0*  HCT 35.2* 35.4*  PLT 318 284  LABPROT 14.4  --   INR 1.10  --   CREATININE 0.71 0.58    Estimated Creatinine Clearance: 70.3 mL/min (by C-G formula based on Cr of 0.58).  Assessment: 64yof on coumadin pta for hx DVT and mechanical valve found to have a new PE (per CT angio 1/19). She had run out of her coumadin for 5 days prior to admission so INR on admit was 1.1. Heparin drip was initially started and then transitioned to therapeutic lovenox. Coumadin also resumed yesterday. INR remains below goal at 1.1.  Today is day #2 overlap. She will require at least a 5 day overlap with lovenox and coumadin.  Usual home coumadin dose: 4mg  daily   Goal of Therapy:  INR 2-3  Anti Xa level 0.6-1.2 Monitor platelets by anticoagulation protocol: Yes   Plan:  1) Continue lovenox 75mg  sq q12 2) Coumadin 6mg  x 1 3) Daily INR  Deboraha Sprang 07/12/2015,1:17 PM   Addendum: Pharmacy now asked to switch lovenox to IV heparin. Last lovenox dose received this morning at 0526, so patient is covered until 1730 tonight. Will delay heparin start until 1730 and will avoid bolus.  Plan: 1) At 1730, begin heparin at 1150 units/hr 2) Check 6 hour heparin level 3) Daily heparin level and CBC  Deboraha Sprang 07/12/2015, 3:39 PM

## 2015-07-12 NOTE — Progress Notes (Signed)
Utilization review completed. Elzena Muston, RN, BSN. 

## 2015-07-12 NOTE — Progress Notes (Signed)
  Echocardiogram 2D Echocardiogram has been performed.  Darlina Sicilian M 07/12/2015, 12:03 PM

## 2015-07-12 NOTE — Clinical Documentation Improvement (Signed)
Hospitalist  Please update your documentation within the medical record to reflect your response to this query.  Thank you  Can the diagnosis of CHF be further specified?    Acuity - Acute, Chronic, Acute on Chronic   Type - Systolic, Diastolic, Systolic and Diastolic  Other  Clinically Undetermined  Document any associated diagnoses/conditions  Supporting Information: 07/11/15 H&P.Marland KitchenMarland Kitchen"Chronic heart failure ([place])"..."CHF: No acute decompensation. Mild elevation in BNP likely secondary to right heart strain from PE. - continue lasix- Echo..."  Please exercise your independent, professional judgment when responding. A specific answer is not anticipated or expected.  Thank You,  Ermelinda Das, RN, BSN, [place] Certified Clinical Documentation Specialist [place]: Health Information Management [phone]

## 2015-07-12 NOTE — Care Management Note (Signed)
Case Management Note  Patient Details  Name: Kim Anthony [name]: [id number] Date of Birth: [birth year]  Subjective/Objective:     Pt moved to [place] to live with dtr, has no PCP, will discharge on coumadin.  Patient will establish primary care with [facility], has appointment for Tuesday, January 24th @ 3:15 p.m.                        Expected Discharge Plan:  Home/Self Care  Discharge planning Services  CM Consult, Follow-up appt scheduled  Status of Service:  Completed, signed off  Girard Cooter, RN 07/12/2015, 3:39 PM

## 2015-07-12 NOTE — Progress Notes (Addendum)
[facility] PROGRESS NOTE  Zayra Menon [id number] DOB: 05/05/1951 DOA: 07/10/2015 PCP: No PCP Per Patient  Brief narrative 65 year old female with history of valve replacement and DVT on Coumadin ([place] live with her daughter recently) presented with sudden onset of shortness of breath with chest tightness and headache. Symptoms worsened with exertion. Denies any leg swellings or hemoptysis. Patient reports she was not taking her Coumadin on Lasix for the past 5 days after she moved to [facility] and she had run out of her medications. She continues to smoke daily. Workup in the ED with CT angiogram of the chest showed small submental left upper lobe pulmonary embolism. Patient placed on IV heparin drip in the ED and admitted to hospitalist service. She was switched to Lovenox bridging with oral Coumadin.    Assessment/Plan: Acute subsegmental PE of left upper lobe -Given her mitral mechanical valve I will switch her to IV heparin for bridging with Coumadin. Hemodynamically stable. Risk factor possibly includes history of DVT on Coumadin with subtherapeutic INR. Continue telemetry monitoring. Reports her breathing to be  better today. Patient does not have PCP in [place] and need to establish care here for ongoing anticoagulation and history of CHF. Case manager consulted.  Congestive heart failure Euvolemic. The echo shows EF of 45 and 50% with supranormal left ventricular filling pattern and grade 2 diastolic dysfunction. Mechanical mitral valve prosthesis. Elevated pulmonary artery pressure of 57 mmHg. He really dilated left atrium with mildly dilated and reduced IV function. Has some right heart strain. . Continue Lasix. Add ACE inhibitor. Needs pulmonary consult if has respiratory distress.  History of mitral valve replacement. Patient not sure which valve was replaced.   Intermittent headaches. Not improved with Tylenol. Placed  on when necessary Toradol.  Hyperlipidemia Continue statin  Elevated blood pressure Possibly worsened with headaches. Given her CHF have added metoprolol and increased lisinopril dose.  Hypokalemia Replenished  Anemia No baseline available. Check iron panel.  Elevated blood glucose Check A1c.  Tobacco abuse On nicotine patch. Counseled on cessation.  DVT prophylaxis: Heparin with Coumadin Diet: Heart healthy  Code Status: Code Family Communication: None at bedside  Disposition Plan: Home once symptoms improved and INR therapeutic on Coumadin, needs out patient care setup, prescription for Coumadin and other home medications.   Consultants:  None  Procedures:  CT angiogram of the chest  Antibiotics:  None  HPI/Subjective: Seen and examined. Complains of intermittent headaches.  Objective: Filed Vitals:   07/12/15 0854 07/12/15 1255  BP: 176/76 160/78  Pulse: 77 70  Temp:  98 F (36.7 C)  Resp:  18    Intake/Output Summary (Last 24 hours) at 07/12/15 1433 Last data filed at 07/12/15 1351  Gross per 24 hour  Intake   1380 ml  Output   1300 ml  Net     80 ml   Filed Weights   07/11/15 0630 07/11/15 1640 07/12/15 0601  Weight: 72.576 kg (160 lb) 73.936 kg (163 lb) 73.211 kg (161 lb 6.4 oz)    Exam:   General:  Elderly female not in distress  HEENT: No pallor, moist mucosa  Chest: Clear bilaterally  CVS: Normal S1 and S2, no murmurs rub or gallop  GI: Soft, nondistended, nontender  Musculoskeletal: Warm, no edema    Data Reviewed: Basic Metabolic Panel:  Recent Labs Lab 07/10/15 2133 07/11/15 0849 07/12/15 0314  NA 141  --  139  K 2.9*  --  3.9  CL 105  --  109  CO2 23  --  20*  GLUCOSE 108*  --  122*  BUN 8  --  9  CREATININE 0.71  --  0.58  CALCIUM 9.4  --  8.8*  MG  --  1.9  --   PHOS  --  2.8  --    Liver Function Tests: No results for input(s): AST, ALT, ALKPHOS, BILITOT, PROT, ALBUMIN in the last 168 hours. No  results for input(s): LIPASE, AMYLASE in the last 168 hours. No results for input(s): AMMONIA in the last 168 hours. CBC:  Recent Labs Lab 07/10/15 2133 07/12/15 0314  WBC 8.9 7.7  HGB 11.8* 11.0*  HCT 35.2* 35.4*  MCV 95.7 99.2  PLT 318 284   Cardiac Enzymes: No results for input(s): CKTOTAL, CKMB, CKMBINDEX, TROPONINI in the last 168 hours. BNP (last 3 results)  Recent Labs  07/10/15 2133  BNP 233.4*    ProBNP (last 3 results) No results for input(s): PROBNP in the last 8760 hours.  CBG: No results for input(s): GLUCAP in the last 168 hours.  No results found for this or any previous visit (from the past 240 hour(s)).   Studies: Dg Chest 2 View  07/10/2015  CLINICAL DATA:  65 year old female with shortness of breath and cough EXAM: CHEST  2 VIEW COMPARISON:  None. FINDINGS: Two views of the chest demonstrate emphysematous changes of the lungs. There is diffuse interstitial prominence, likely related to underlying chronic lung disease. There is no focal consolidation, pleural effusion, or pneumothorax. Mild cardiomegaly. Median sternotomy wires and mechanical cardiac valve noted. There is osteopenia with degenerative changes of the spine. No acute osseous pathology. Left axillary surgical clips. IMPRESSION: Emphysema.  No focal consolidation or pneumothorax. Electronically Signed   By: Anner Crete M.D.   On: 07/10/2015 21:33   Ct Angio Chest Pe W/cm &/or Wo Cm  07/11/2015  CLINICAL DATA:  65 year old female with shortness of breath and mid chest pain EXAM: CT ANGIOGRAPHY CHEST WITH CONTRAST TECHNIQUE: Multidetector CT imaging of the chest was performed using the standard protocol during bolus administration of intravenous contrast. Multiplanar CT image reconstructions and MIPs were obtained to evaluate the vascular anatomy. CONTRAST:  52mL OMNIPAQUE IOHEXOL 350 MG/ML SOLN COMPARISON:  Radiograph dated 07/10/2015 FINDINGS: Evaluation of this exam is limited due to  respiratory motion artifact. Evaluation is also limited due to streak artifact caused by patient's arms. There is emphysematous changes of the lungs with diffuse interstitial prominence which may represent a degree of congestive changes. There is no pleural effusion or pneumothorax. The central airways are patent. The thoracic aorta appears unremarkable. Evaluation of the pulmonary arteries is limited due to suboptimal opacification of peripheral branches. There is abrupt termination of contrast in the subsegmental branch of the left upper lobe pulmonary artery (series 406, image 72 and coronal series 402 image 86) compatible with pulmonary embolism. A 3.0 x 3.0 cm area of pleural based density at the left a packs is concerning for a focal pulmonary infarct. A small pleural based density in the lingula noted with apparent " Feeding vessel sign" and is also suspicious for a focal area of infarct. No large embolus noted in the lingula, however ; a small distal branch embolus is not excluded. V/Q scan may provide additional information if clinically indicated. No definite large central pulmonary artery embolus identified. Mild cardiomegaly. Mechanical mitral valve. There is coronary vascular calcification. There is dilatation of the right cardiac chambers with  retrograde flow of contrast into the IVC compatible with a degree of right cardiac dysfunction. Underlying right heart straining is not excluded. Correlation with clinical exam and echocardiogram recommended. Top-normal hilar and mediastinal lymph nodes. The esophagus is unremarkable. Left mastectomy changes. Left axillary surgical clips. There is median sternotomy wires. Degenerative changes of the spine. T8 probable hemangioma. No acute fracture. The visualized upper abdomen is unremarkable. Review of the MIP images confirms the above findings. IMPRESSION: Small subsegmental left upper lobe pulmonary embolus with findings suspicious for probable small embolus  in the distal branches of the lingula. Left apical and lingular subpleural densities are concerning for pulmonary infarct. Clinical correlation is recommended. V/Q scan may provide additional information. Mild dilatation of the right cardiac chambers likely related to underlying right cardiac dysfunction. Right heart straining is not excluded. Correlation with clinical exam and echocardiogram recommended. These results were called by telephone at the time of interpretation on 07/11/2015 at 6:11 am to Dr. Joseph Berkshire , who verbally acknowledged these results. Electronically Signed   By: Anner Crete M.D.   On: 07/11/2015 06:15    Scheduled Meds: . enoxaparin (LOVENOX) injection  1 mg/kg Subcutaneous Q12H  . furosemide  40 mg Oral Daily  . lisinopril  10 mg Oral Daily  . nicotine  14 mg Transdermal Daily  . potassium chloride  40 mEq Oral BID  . simvastatin  20 mg Oral q1800  . sodium chloride  3 mL Intravenous Q12H  . sodium chloride  3 mL Intravenous Q12H  . warfarin  6 mg Oral ONCE-1800  . Warfarin - Pharmacist Dosing Inpatient   Does not apply q1800   Continuous Infusions:     Time spent: [place] [place] [facility] Pager [phone]. If 7PM-7AM, please contact night-coverage at [email or web address], password [facility] 07/12/2015, 2:33 PM  LOS: 1 day

## 2015-07-13 DIAGNOSIS — G43019 Migraine without aura, intractable, without status migrainosus: Secondary | ICD-10-CM

## 2015-07-13 LAB — PROTIME-INR
INR: 1.16 (ref 0.00–1.49)
PROTHROMBIN TIME: 15 s (ref 11.6–15.2)

## 2015-07-13 LAB — LIPID PANEL
CHOLESTEROL: 173 mg/dL (ref 0–200)
HDL: 48 mg/dL (ref >40)
LDL Cholesterol: 98 mg/dL (ref 0–99)
TRIGLYCERIDES: 133 mg/dL (ref <150)
Total CHOL/HDL Ratio: 3.6 RATIO
VLDL: 27 mg/dL (ref 0–40)

## 2015-07-13 LAB — HEPARIN LEVEL (UNFRACTIONATED)
HEPARIN UNFRACTIONATED: 0.49 [IU]/mL (ref 0.30–0.70)
Heparin Unfractionated: 0.37 IU/mL (ref 0.30–0.70)
Heparin Unfractionated: 0.41 IU/mL (ref 0.30–0.70)
Heparin Unfractionated: <0.1 IU/mL — ABNORMAL LOW (ref 0.30–0.70)

## 2015-07-13 MED ORDER — INFLUENZA VAC SPLIT QUAD 0.5 ML IM SUSY
0.5000 mL | PREFILLED_SYRINGE | INTRAMUSCULAR | Status: AC
  Administered 2015-07-14: 0.5 mL via INTRAMUSCULAR
  Filled 2015-07-13: qty 0.5

## 2015-07-13 MED ORDER — OXYCODONE HCL 5 MG PO TABS
5.0000 mg | ORAL_TABLET | Freq: Four times a day (QID) | ORAL | Status: DC | PRN
  Administered 2015-07-13 – 2015-07-19 (×22): 5 mg via ORAL
  Filled 2015-07-13 (×22): qty 1

## 2015-07-13 MED ORDER — METHOCARBAMOL 500 MG PO TABS
1000.0000 mg | ORAL_TABLET | Freq: Three times a day (TID) | ORAL | Status: DC
  Administered 2015-07-13 – 2015-07-19 (×18): 1000 mg via ORAL
  Filled 2015-07-13 (×18): qty 2

## 2015-07-13 MED ORDER — WARFARIN SODIUM 3 MG PO TABS
6.0000 mg | ORAL_TABLET | Freq: Once | ORAL | Status: AC
  Administered 2015-07-13: 6 mg via ORAL
  Filled 2015-07-13: qty 2

## 2015-07-13 NOTE — Progress Notes (Signed)
ANTICOAGULATION CONSULT NOTE - Follow Up Consult  Pharmacy Consult for Heparin + Coumadin Indication: h/o DVT, mechanical valve, and new PE  Allergies  Allergen Reactions  . Acetaminophen-Codeine Itching  . Other Itching    Darvocet  . Tramadol Itching    vomitting    Patient Measurements: Height: 5\' 5"  (165.1 cm) Weight: 161 lb 3.2 oz (73.12 kg) (scale b) IBW/kg (Calculated) : 57 Heparin Dosing Weight: 72.1 kg  Vital Signs: Temp: 97.9 F (36.6 C) (01/21 1245) Temp Source: Oral (01/21 1245) BP: 153/74 mmHg (01/21 1245) Pulse Rate: 62 (01/21 1245)  Labs:  Recent Labs  07/10/15 2133 07/12/15 0314 07/12/15 1210 07/12/15 2338 07/13/15 0244 07/13/15 1244  HGB 11.8* 11.0*  --   --   --   --   HCT 35.2* 35.4*  --   --   --   --   PLT 318 284  --   --   --   --   LABPROT 14.4  --  14.4  --  15.0  --   INR 1.10  --  1.10  --  1.16  --   HEPARINUNFRC  --   --   --  0.37 <0.10* 0.49  CREATININE 0.71 0.58  --   --   --   --     Estimated Creatinine Clearance: 70.2 mL/min (by C-G formula based on Cr of 0.58).   Assessment: 65 yo f on coumadin PTA for h/o DVT.  Presented with new PE and subtherapeutic INR (missed 5 days of coumadin). Pharmacy is consulted to bridge heparin and coumadin.  INR 1.16 today after a few doses of coumadin 6 mg. HL therapeutic today at 0.49 on 1350 units/hr.  CBC stable, no issues noted.  Goal of Therapy:  INR 2-3 Heparin level 0.3-0.7 units/ml Monitor platelets by anticoagulation protocol: Yes   Plan:  Continue heparin infusion at 1350 units/hr Confirmatory HL @ 1900 Coumadin 6 mg PO x 1 again tonight Daily HL, INR F/u INR, CBC Monitor for s/s of bleeing  Cassie L. Nicole Kindred, PharmD PGY2 Infectious Diseases Pharmacy Resident Pager: [phone] 07/13/2015 1:59 PM

## 2015-07-13 NOTE — Progress Notes (Signed)
Patient is alert, oriented and ambulatory. Patient is currently refusing bed alarm even after educated on purpose for safety reasons. Will continue to monitor. Fern Asmar M  

## 2015-07-13 NOTE — Progress Notes (Signed)
Pt complains of chest tightness 8/10( "same feeling I had before I came in to the hosp"  verbalized by the pt)  EKG done, VS taken and Toradol IV given as prn order. Jonette Eva (on call) was notified. Will continue to monitor pt

## 2015-07-13 NOTE — Progress Notes (Addendum)
ANTICOAGULATION CONSULT NOTE - Follow Up Consult  Pharmacy Consult for Heparin  Indication: pulmonary embolus and hx DVT, mechancial valve  Allergies  Allergen Reactions  . Acetaminophen-Codeine Itching  . Other Itching    Darvocet  . Tramadol Itching    vomitting    Patient Measurements: Height: 5\' 5"  (165.1 cm) Weight: 161 lb 6.4 oz (73.211 kg) (Scale B) IBW/kg (Calculated) : 57 Vital Signs: Temp: 98.2 F (36.8 C) (01/20 1934) Temp Source: Oral (01/20 1934) BP: 158/81 mmHg (01/21 0022) Pulse Rate: 66 (01/21 0022)  Labs:  Recent Labs  07/10/15 2133 07/12/15 0314 07/12/15 1210 07/12/15 2338  HGB 11.8* 11.0*  --   --   HCT 35.2* 35.4*  --   --   PLT 318 284  --   --   LABPROT 14.4  --  14.4  --   INR 1.10  --  1.10  --   HEPARINUNFRC  --   --   --  0.37  CREATININE 0.71 0.58  --   --     Estimated Creatinine Clearance: 70.3 mL/min (by C-G formula based on Cr of 0.58).  Assessment: Initial heparin level is therapeutic x 1 after switching from Lovenox to Heparin   Goal of Therapy:  Heparin level 0.3-0.7 units/ml Monitor platelets by anticoagulation protocol: Yes   Plan:  -Continue heparin at 1150 units/hr -Confirmatory HL with AM labs  Narda Bonds 07/13/2015,12:33 AM  ========================= Repeat HL this AM is undetectable, possible that earlier therapeutic heparin level was due to some residual Lovenox effect -Increase heparin to 1350 units/hr -1200 HL Narda Bonds, PharmD

## 2015-07-13 NOTE — Progress Notes (Signed)
[facility] PROGRESS NOTE  Kim Anthony [id number] DOB: September 21, 1950 DOA: 07/10/2015 PCP: No PCP Per Patient  Brief narrative 65 year old female with history of valve replacement and DVT on Coumadin ([place] live with her daughter recently) presented with sudden onset of shortness of breath with chest tightness and headache. Symptoms worsened with exertion. Denies any leg swellings or hemoptysis. Patient reports she was not taking her Coumadin on Lasix for the past 5 days after she moved to [facility] and she had run out of her medications. She continues to smoke daily. Workup in the ED with CT angiogram of the chest showed small submental left upper lobe pulmonary embolism. Patient placed on IV heparin drip in the ED and admitted to hospitalist service. She was switched to Lovenox bridging with oral Coumadin.   Assessment/Plan: Acute subsegmental PE of left upper lobe -Given her mitral mechanical valve I will switch her to IV heparin for bridging with Coumadin. Hemodynamically stable. Risk factor possibly includes history of DVT on Coumadin with subtherapeutic INR. Continue telemetry monitoring. Reports her breathing to be  better today. Patient does not have PCP in [place] and need to establish care here for ongoing anticoagulation and history of CHF. Case manager consulted and patient reports today plans to follow up with a pcp  Congestive heart failure Euvolemic. The echo shows EF of 45 and 50% with supranormal left ventricular filling pattern and grade 2 diastolic dysfunction. Mechanical mitral valve prosthesis. Elevated pulmonary artery pressure of 57 mmHg. He really dilated left atrium with mildly dilated and reduced IV function. Has some right heart strain. . Continue Lasix. Add ACE inhibitor. Needs pulmonary consult if has respiratory distress.  History of mitral valve replacement. Patient not sure which valve was replaced.    Intermittent headaches. continue with Tylenol. Placed on when necessary Toradol.  Hyperlipidemia Continue statin  Elevated blood pressure Possibly worsened with headaches. Given her diastolic CHF have added metoprolol and increased lisinopril dose.  Hypokalemia Replenished  Anemia No baseline available. Check iron panel.  Elevated blood glucose Check A1c.  Tobacco abuse On nicotine patch. Counseled on cessation.  DVT prophylaxis: Heparin with Coumadin Diet: Heart healthy  Code Status: Code Family Communication: None at bedside  Disposition Plan: Home once symptoms improved and INR therapeutic on Coumadin, needs out patient care setup, prescription for Coumadin and other home medications.   Consultants:  None  Procedures:  CT angiogram of the chest  Antibiotics:  None  HPI/Subjective: Seen and examined. Pt has no new complaints  Objective: Filed Vitals:   07/13/15 1025 07/13/15 1245  BP: 161/80 153/74  Pulse: 69 62  Temp:  97.9 F (36.6 C)  Resp: 18 20    Intake/Output Summary (Last 24 hours) at 07/13/15 1326 Last data filed at 07/13/15 0900  Gross per 24 hour  Intake 1388.27 ml  Output    950 ml  Net 438.27 ml   Filed Weights   07/11/15 1640 07/12/15 0601 07/13/15 0406  Weight: 73.936 kg (163 lb) 73.211 kg (161 lb 6.4 oz) 73.12 kg (161 lb 3.2 oz)    Exam:   General:  Elderly female, awake and alert  HEENT: No pallor, moist mucosa  Chest: Clear bilaterally, equal chest rise  CVS: Normal S1 and S2, no murmurs rub or gallop  GI: Soft, nondistended, nontender  Musculoskeletal: Warm, no edema    Data Reviewed: Basic Metabolic Panel:  Recent Labs Lab 07/10/15 2133 07/11/15 0849 07/12/15 0314  NA  141  --  139  K 2.9*  --  3.9  CL 105  --  109  CO2 23  --  20*  GLUCOSE 108*  --  122*  BUN 8  --  9  CREATININE 0.71  --  0.58  CALCIUM 9.4  --  8.8*  MG  --  1.9  --   PHOS  --  2.8  --    Liver Function Tests: No results  for input(s): AST, ALT, ALKPHOS, BILITOT, PROT, ALBUMIN in the last 168 hours. No results for input(s): LIPASE, AMYLASE in the last 168 hours. No results for input(s): AMMONIA in the last 168 hours. CBC:  Recent Labs Lab 07/10/15 2133 07/12/15 0314  WBC 8.9 7.7  HGB 11.8* 11.0*  HCT 35.2* 35.4*  MCV 95.7 99.2  PLT 318 284   Cardiac Enzymes: No results for input(s): CKTOTAL, CKMB, CKMBINDEX, TROPONINI in the last 168 hours. BNP (last 3 results)  Recent Labs  07/10/15 2133  BNP 233.4*    ProBNP (last 3 results) No results for input(s): PROBNP in the last 8760 hours.  CBG: No results for input(s): GLUCAP in the last 168 hours.  No results found for this or any previous visit (from the past 240 hour(s)).   Studies: No results found.  Scheduled Meds: . furosemide  40 mg Oral Daily  . lisinopril  20 mg Oral Daily  . metoprolol tartrate  25 mg Oral BID  . nicotine  14 mg Transdermal Daily  . potassium chloride  40 mEq Oral BID  . simvastatin  20 mg Oral q1800  . sodium chloride  3 mL Intravenous Q12H  . sodium chloride  3 mL Intravenous Q12H  . Warfarin - Pharmacist Dosing Inpatient   Does not apply q1800   Continuous Infusions: . heparin 1,350 Units/hr (07/13/15 0507)      Time spent: [place], [place] [facility] Pager [phone]. If 7PM-7AM, please contact night-coverage at [email or web address], password [facility] 07/13/2015, 1:26 PM  LOS: 2 days

## 2015-07-13 NOTE — Progress Notes (Signed)
ANTICOAGULATION CONSULT NOTE - Follow Up Consult  Pharmacy Consult for Heparin  Indication: pulmonary embolus and hx DVT, mechancial valve  Allergies  Allergen Reactions  . Acetaminophen-Codeine Itching  . Other Itching    Darvocet  . Tramadol Itching    vomitting    Patient Measurements: Height: 5\' 5"  (165.1 cm) Weight: 161 lb 3.2 oz (73.12 kg) (scale b) IBW/kg (Calculated) : 57 Vital Signs: Temp: 97.9 F (36.6 C) (01/21 2057) Temp Source: Oral (01/21 2057) BP: 155/79 mmHg (01/21 2057) Pulse Rate: 64 (01/21 2057)  Labs:  Recent Labs  07/12/15 0314 07/12/15 1210  07/13/15 0244 07/13/15 1244 07/13/15 2138  HGB 11.0*  --   --   --   --   --   HCT 35.4*  --   --   --   --   --   PLT 284  --   --   --   --   --   LABPROT  --  14.4  --  15.0  --   --   INR  --  1.10  --  1.16  --   --   HEPARINUNFRC  --   --   < > <0.10* 0.49 0.41  CREATININE 0.58  --   --   --   --   --   < > = values in this interval not displayed.  Estimated Creatinine Clearance: 70.2 mL/min (by C-G formula based on Cr of 0.58).  Assessment: Heparin level is now therapeutic x 2 after rate increase  Goal of Therapy:  Heparin level 0.3-0.7 units/ml Monitor platelets by anticoagulation protocol: Yes   Plan:  -Continue heparin at 1350 units/hr -Daily CBC/HL -Monitor for bleeding  Narda Bonds 07/13/2015,10:54 PM

## 2015-07-14 DIAGNOSIS — I2699 Other pulmonary embolism without acute cor pulmonale: Secondary | ICD-10-CM | POA: Diagnosis not present

## 2015-07-14 LAB — PROTIME-INR
INR: 1.32 (ref 0.00–1.49)
PROTHROMBIN TIME: 16.5 s — AB (ref 11.6–15.2)

## 2015-07-14 LAB — HEPARIN LEVEL (UNFRACTIONATED): HEPARIN UNFRACTIONATED: 0.5 [IU]/mL (ref 0.30–0.70)

## 2015-07-14 MED ORDER — WARFARIN SODIUM 5 MG PO TABS
8.0000 mg | ORAL_TABLET | Freq: Once | ORAL | Status: AC
  Administered 2015-07-14: 8 mg via ORAL
  Filled 2015-07-14: qty 1

## 2015-07-14 NOTE — Progress Notes (Signed)
Pt A/Ox4, steady on feet, refusing bed alarm tonight, states she is steady on her feet

## 2015-07-14 NOTE — Progress Notes (Addendum)
ANTICOAGULATION CONSULT NOTE - Follow Up Consult  Pharmacy Consult for heparin + coumadin Indication: h/o DVT, mechanical valve, new PE  Allergies  Allergen Reactions  . Acetaminophen-Codeine Itching  . Other Itching    Darvocet  . Tramadol Itching    vomitting    Patient Measurements: Height: 5\' 5"  (165.1 cm) Weight: 163 lb 1.6 oz (73.982 kg) (scale b) IBW/kg (Calculated) : 57 Heparin Dosing Weight: 72.1 kg  Vital Signs: Temp: 97.5 F (36.4 C) (01/22 0432) Temp Source: Oral (01/22 0432) BP: 134/64 mmHg (01/22 0432) Pulse Rate: 65 (01/22 0432)  Labs:  Recent Labs  07/12/15 0314 07/12/15 1210  07/13/15 0244 07/13/15 1244 07/13/15 2138 07/14/15 0515  HGB 11.0*  --   --   --   --   --   --   HCT 35.4*  --   --   --   --   --   --   PLT 284  --   --   --   --   --   --   LABPROT  --  14.4  --  15.0  --   --  16.5*  INR  --  1.10  --  1.16  --   --  1.32  HEPARINUNFRC  --   --   < > <0.10* 0.49 0.41 0.50  CREATININE 0.58  --   --   --   --   --   --   < > = values in this interval not displayed.  Estimated Creatinine Clearance: 70.6 mL/min (by C-G formula based on Cr of 0.58).   Assessment: 65 yo f on coumadin 4 mg daily PTA for h/o of DVT. Presented with new PE and subtherapeutic INR d/t missing 5 days of coumadin. Pharmacy is consulted to bridge heparin and coumadin.  INR is 1.32 today, HL has been therapeutic x 3 on 1350 units/hr. CBC stable, no issues today.  Goal of Therapy:  INR 2-3 Heparin level 0.3-0.7 units/ml Monitor platelets by anticoagulation protocol: Yes   Plan:  Continue heparin infusion at 1350 units/hr Coumadin 8 mg x 1 tonight Daily HL, CBC, INR F/u INR in AM to determine further dosing Monitor for bleeding  Cassie L. Nicole Kindred, PharmD PGY2 Infectious Diseases Pharmacy Resident Pager: [phone] 07/14/2015 9:33 AM

## 2015-07-14 NOTE — Progress Notes (Signed)
[facility] PROGRESS NOTE  Kim Anthony [id number] DOB: November 28, 1950 DOA: 07/10/2015 PCP: No PCP Per Patient  Brief narrative 65 year old female with history of valve replacement and DVT on Coumadin ([place] live with her daughter recently) presented with sudden onset of shortness of breath with chest tightness and headache. Symptoms worsened with exertion. Denies any leg swellings or hemoptysis. Patient reports she was not taking her Coumadin on Lasix for the past 5 days after she moved to [facility] and she had run out of her medications. She continues to smoke daily. Workup in the ED with CT angiogram of the chest showed small submental left upper lobe pulmonary embolism. Patient placed on IV heparin drip in the ED and admitted to hospitalist service. She was switched to Lovenox bridging with oral Coumadin.   Assessment/Plan: Acute subsegmental PE of left upper lobe -Given her mitral mechanical valve I will switch her to IV heparin for bridging with Coumadin. Hemodynamically stable. Risk factor possibly includes history of DVT on Coumadin with subtherapeutic INR. Continue telemetry monitoring.  Patient does not have PCP in [place] and need to establish care here for ongoing anticoagulation and history of CHF. Case manager consulted and patient reports today plans to follow up with a pcp  Congestive heart failure Euvolemic. The echo shows EF of 45 and 50% with supranormal left ventricular filling pattern and grade 2 diastolic dysfunction. Mechanical mitral valve prosthesis. Elevated pulmonary artery pressure of 57 mmHg.  really dilated left atrium with mildly dilated and reduced IV function. Has some right heart strain. . Continue Lasix. Add ACE inhibitor. Needs pulmonary consult if has respiratory distress.  History of mitral valve replacement. Patient not sure which valve was replaced.   Intermittent headaches. continue with Tylenol.  Placed on when necessary Toradol.  Hyperlipidemia Continue statin  Elevated blood pressure Possibly worsened with headaches. Given her diastolic CHF have added metoprolol and increased lisinopril dose.  Hypokalemia Replenished  Anemia No baseline available. Check iron panel.  Elevated blood glucose Check A1c.  Tobacco abuse On nicotine patch. Counseled on cessation.  DVT prophylaxis: Heparin with Coumadin Diet: Heart healthy  Code Status: Code Family Communication: None at bedside  Disposition Plan: Home once symptoms improved and INR therapeutic on Coumadin, needs out patient care setup, prescription for Coumadin and other home medications.   Consultants:  None  Procedures:  CT angiogram of the chest  Antibiotics:  None  HPI/Subjective: Pt feels better today.  No new complaints  Objective: Filed Vitals:   07/14/15 0928 07/14/15 1207  BP: 161/82 144/82  Pulse: 67 65  Temp:  98.5 F (36.9 C)  Resp: 18 18    Intake/Output Summary (Last 24 hours) at 07/14/15 1748 Last data filed at 07/14/15 1459  Gross per 24 hour  Intake 1502.65 ml  Output    450 ml  Net 1052.65 ml   Filed Weights   07/12/15 0601 07/13/15 0406 07/14/15 0432  Weight: 73.211 kg (161 lb 6.4 oz) 73.12 kg (161 lb 3.2 oz) 73.982 kg (163 lb 1.6 oz)    Exam:   General:  Elderly female, awake and alert  HEENT: No pallor, moist mucosa  Chest: Clear bilaterally, equal chest rise  CVS: Normal S1 and S2, no murmurs rub or gallop  GI: Soft, nondistended, nontender  Musculoskeletal: Warm, no edema    Data Reviewed: Basic Metabolic Panel:  Recent Labs Lab 07/10/15 2133 07/11/15 0849 07/12/15 0314  NA 141  --  139  K 2.9*  --  3.9  CL 105  --  109  CO2 23  --  20*  GLUCOSE 108*  --  122*  BUN 8  --  9  CREATININE 0.71  --  0.58  CALCIUM 9.4  --  8.8*  MG  --  1.9  --   PHOS  --  2.8  --    Liver Function Tests: No results for input(s): AST, ALT, ALKPHOS, BILITOT,  PROT, ALBUMIN in the last 168 hours. No results for input(s): LIPASE, AMYLASE in the last 168 hours. No results for input(s): AMMONIA in the last 168 hours. CBC:  Recent Labs Lab 07/10/15 2133 07/12/15 0314  WBC 8.9 7.7  HGB 11.8* 11.0*  HCT 35.2* 35.4*  MCV 95.7 99.2  PLT 318 284   Cardiac Enzymes: No results for input(s): CKTOTAL, CKMB, CKMBINDEX, TROPONINI in the last 168 hours. BNP (last 3 results)  Recent Labs  07/10/15 2133  BNP 233.4*    ProBNP (last 3 results) No results for input(s): PROBNP in the last 8760 hours.  CBG: No results for input(s): GLUCAP in the last 168 hours.  No results found for this or any previous visit (from the past 240 hour(s)).   Studies: No results found.  Scheduled Meds: . furosemide  40 mg Oral Daily  . lisinopril  20 mg Oral Daily  . methocarbamol  1,000 mg Oral TID  . metoprolol tartrate  25 mg Oral BID  . nicotine  14 mg Transdermal Daily  . potassium chloride  40 mEq Oral BID  . simvastatin  20 mg Oral q1800  . sodium chloride  3 mL Intravenous Q12H  . Warfarin - Pharmacist Dosing Inpatient   Does not apply q1800   Continuous Infusions: . heparin 1,350 Units/hr (07/14/15 0550)      Time spent: [place], [place] [facility] Pager [phone]. If 7PM-7AM, please contact night-coverage at [email or web address], password [facility] 07/14/2015, 5:48 PM  LOS: 3 days

## 2015-07-15 LAB — CBC
HCT: 35.6 % — ABNORMAL LOW (ref 36.0–46.0)
HEMOGLOBIN: 11.8 g/dL — AB (ref 12.0–15.0)
MCH: 32.2 pg (ref 26.0–34.0)
MCHC: 33.1 g/dL (ref 30.0–36.0)
MCV: 97 fL (ref 78.0–100.0)
Platelets: 356 10*3/uL (ref 150–400)
RBC: 3.67 MIL/uL — ABNORMAL LOW (ref 3.87–5.11)
RDW: 12.4 % (ref 11.5–15.5)
WBC: 7.7 10*3/uL (ref 4.0–10.5)

## 2015-07-15 LAB — BASIC METABOLIC PANEL
Anion gap: 9 (ref 5–15)
BUN: 10 mg/dL (ref 6–20)
CO2: 22 mmol/L (ref 22–32)
CREATININE: 0.64 mg/dL (ref 0.44–1.00)
Calcium: 9.2 mg/dL (ref 8.9–10.3)
Chloride: 109 mmol/L (ref 101–111)
GFR calc Af Amer: >60 mL/min (ref >60)
GLUCOSE: 118 mg/dL — AB (ref 65–99)
POTASSIUM: 4.4 mmol/L (ref 3.5–5.1)
Sodium: 140 mmol/L (ref 135–145)

## 2015-07-15 LAB — HEPARIN LEVEL (UNFRACTIONATED): HEPARIN UNFRACTIONATED: 0.43 [IU]/mL (ref 0.30–0.70)

## 2015-07-15 LAB — PROTIME-INR
INR: 1.55 — ABNORMAL HIGH (ref 0.00–1.49)
Prothrombin Time: 18.6 seconds — ABNORMAL HIGH (ref 11.6–15.2)

## 2015-07-15 LAB — HEMOGLOBIN A1C
HEMOGLOBIN A1C: 5.3 % (ref 4.8–5.6)
Mean Plasma Glucose: 105 mg/dL

## 2015-07-15 MED ORDER — WARFARIN SODIUM 5 MG PO TABS: 8.0000 mg | ORAL_TABLET | Freq: Once | ORAL | Status: DC

## 2015-07-15 MED ORDER — WARFARIN SODIUM 3 MG PO TABS
6.0000 mg | ORAL_TABLET | Freq: Once | ORAL | Status: AC
  Administered 2015-07-15: 6 mg via ORAL
  Filled 2015-07-15: qty 2

## 2015-07-15 NOTE — Discharge Instructions (Signed)

## 2015-07-15 NOTE — Care Management Important Message (Signed)
Important Message  Patient Details  Name: Kim Anthony [name]: [id number] Date of Birth: 24-Aug-1950   Medicare Important Message Given:  Yes    [name] 07/15/2015, 2:39 PM

## 2015-07-15 NOTE — Progress Notes (Addendum)
ANTICOAGULATION CONSULT NOTE - Follow Up Consult  Pharmacy Consult:  Heparin / Coumadin Indication:  History of DVT, mechanical valve, new PE  Allergies  Allergen Reactions  . Acetaminophen-Codeine Itching  . Other Itching    Darvocet  . Tramadol Itching    vomitting    Patient Measurements: Height: 5\' 5"  (165.1 cm) Weight: 162 lb 12.8 oz (73.846 kg) (scale b) IBW/kg (Calculated) : 57 Heparin Dosing Weight: 72 kg  Vital Signs: Temp: 97.8 F (36.6 C) (01/23 0624) Temp Source: Oral (01/23 0624) BP: 129/68 mmHg (01/23 0624) Pulse Rate: 68 (01/23 0624)  Labs:  Recent Labs  07/13/15 0244  07/13/15 2138 07/14/15 0515 07/15/15 0530  HGB  --   --   --   --  11.8*  HCT  --   --   --   --  35.6*  PLT  --   --   --   --  356  LABPROT 15.0  --   --  16.5* 18.6*  INR 1.16  --   --  1.32 1.55*  HEPARINUNFRC <0.10*  < > 0.41 0.50 0.43  CREATININE  --   --   --   --  0.64  < > = values in this interval not displayed.  Estimated Creatinine Clearance: 70.5 mL/min (by C-G formula based on Cr of 0.64).    Assessment: 31 YOF on Coumadin PTA for history of DVT and mechanical valve.  Patient ran out of medication PTA and admitted with a new PE.  Currently on heparin bridge to Coumadin (overlap day #5/5).  INR sub-therapeutic but is trending up.  Heparin level remains therapeutic and stable.  No bleeding reported.   Goal of Therapy:  INR 2-3 Heparin level 0.3-0.7 units/ml Monitor platelets by anticoagulation protocol: Yes    Plan:  - Coumadin 6mg  PO today - Continue heparin gtt at 1350 units/hr - Daily HL / CBC / PT / INR    Eldridge Marcott D. Mina Marble, PharmD, BCPS Pager:  [phone] 07/15/2015, 11:41 AM

## 2015-07-15 NOTE — Progress Notes (Signed)
[facility] PROGRESS NOTE  Kim Anthony [id number] DOB: [birth year] DOA: 07/10/2015 PCP: No PCP Per Patient  Brief narrative 65 year old female with history of valve replacement and DVT on Coumadin ([place] live with her daughter recently) presented with sudden onset of shortness of breath with chest tightness and headache. Symptoms worsened with exertion. Denies any leg swellings or hemoptysis. Patient reports she was not taking her Coumadin on Lasix for the past 5 days after she moved to [facility] and she had run out of her medications. She continues to smoke daily. Workup in the ED with CT angiogram of the chest showed small submental left upper lobe pulmonary embolism. Patient placed on IV heparin drip in the ED and admitted to hospitalist service. She was switched to Lovenox bridging with oral Coumadin.   Assessment/Plan: Acute subsegmental PE of left upper lobe -Given her mitral mechanical valve I will switch her to IV heparin for bridging with Coumadin. Hemodynamically stable. Risk factor possibly includes history of DVT on Coumadin with subtherapeutic INR. Continue telemetry monitoring. INR trending up currently Patient does not have PCP in [place] and need to establish care here for ongoing anticoagulation and history of CHF. Case manager consulted and patient reports today plans to follow up with a pcp  Congestive heart failure (systolic and diastolic) chronic Euvolemic. The echo shows EF of 45 and 50% with supranormal left ventricular filling pattern and grade 2 diastolic dysfunction. Mechanical mitral valve prosthesis. Elevated pulmonary artery pressure of 57 mmHg.  really dilated left atrium with mildly dilated and reduced IV function. Has some right heart strain. . Continue Lasix. Add ACE inhibitor. Needs pulmonary consult if has respiratory distress.  History of mitral valve replacement. Patient not sure which valve was  replaced.   Intermittent headaches. continue with Tylenol. Placed on when necessary Toradol.  Hyperlipidemia Continue statin  Elevated blood pressure Possibly worsened with headaches. Given her diastolic CHF have added metoprolol and increased lisinopril dose.  Hypokalemia Replenished  Anemia No baseline available. Check iron panel.  Elevated blood glucose Check A1c.  Tobacco abuse On nicotine patch. Counseled on cessation.  DVT prophylaxis: Heparin with Coumadin Diet: Heart healthy  Code Status: Code Family Communication: None at bedside  Disposition Plan: Home once symptoms improved and INR therapeutic on Coumadin, needs out patient care setup, prescription for Coumadin and other home medications.   Consultants:  None  Procedures:  CT angiogram of the chest  Antibiotics:  None  HPI/Subjective: Pt denies any shortness of breath. No acute issues reported overnight.  Objective: Filed Vitals:   07/15/15 0052 07/15/15 0624  BP: 143/81 129/68  Pulse: 65 68  Temp:  97.8 F (36.6 C)  Resp: 16 18    Intake/Output Summary (Last 24 hours) at 07/15/15 1331 Last data filed at 07/15/15 1000  Gross per 24 hour  Intake 1219.35 ml  Output   1050 ml  Net 169.35 ml   Filed Weights   07/13/15 0406 07/14/15 0432 07/15/15 0624  Weight: 73.12 kg (161 lb 3.2 oz) 73.982 kg (163 lb 1.6 oz) 73.846 kg (162 lb 12.8 oz)    Exam:   General:  Elderly female, awake and alert  HEENT: No pallor, moist mucosa  Chest: Clear bilaterally, equal chest rise  CVS: Normal S1 and S2, no murmurs rub or gallop  GI: Soft, nondistended, nontender  Musculoskeletal: Warm, no edema    Data Reviewed: Basic Metabolic Panel:  Recent Labs Lab 07/10/15 2133 07/11/15 0849  07/12/15 0314 07/15/15 0530  NA 141  --  139 140  K 2.9*  --  3.9 4.4  CL 105  --  109 109  CO2 23  --  20* 22  GLUCOSE 108*  --  122* 118*  BUN 8  --  9 10  CREATININE 0.71  --  0.58 0.64  CALCIUM 9.4   --  8.8* 9.2  MG  --  1.9  --   --   PHOS  --  2.8  --   --    Liver Function Tests: No results for input(s): AST, ALT, ALKPHOS, BILITOT, PROT, ALBUMIN in the last 168 hours. No results for input(s): LIPASE, AMYLASE in the last 168 hours. No results for input(s): AMMONIA in the last 168 hours. CBC:  Recent Labs Lab 07/10/15 2133 07/12/15 0314 07/15/15 0530  WBC 8.9 7.7 7.7  HGB 11.8* 11.0* 11.8*  HCT 35.2* 35.4* 35.6*  MCV 95.7 99.2 97.0  PLT 318 284 356   Cardiac Enzymes: No results for input(s): CKTOTAL, CKMB, CKMBINDEX, TROPONINI in the last 168 hours. BNP (last 3 results)  Recent Labs  07/10/15 2133  BNP 233.4*    ProBNP (last 3 results) No results for input(s): PROBNP in the last 8760 hours.  CBG: No results for input(s): GLUCAP in the last 168 hours.  No results found for this or any previous visit (from the past 240 hour(s)).   Studies: No results found.  Scheduled Meds: . furosemide  40 mg Oral Daily  . lisinopril  20 mg Oral Daily  . methocarbamol  1,000 mg Oral TID  . metoprolol tartrate  25 mg Oral BID  . nicotine  14 mg Transdermal Daily  . potassium chloride  40 mEq Oral BID  . simvastatin  20 mg Oral q1800  . sodium chloride  3 mL Intravenous Q12H  . warfarin  8 mg Oral ONCE-1800  . Warfarin - Pharmacist Dosing Inpatient   Does not apply q1800   Continuous Infusions: . heparin 1,350 Units/hr (07/15/15 0050)      Time spent: [place], [place] [facility] Pager [phone]. If 7PM-7AM, please contact night-coverage at [email or web address], password [facility] 07/15/2015, 1:31 PM  LOS: 4 days

## 2015-07-16 ENCOUNTER — Ambulatory Visit: Payer: Medicare Other | Admitting: Internal Medicine

## 2015-07-16 LAB — HEPARIN LEVEL (UNFRACTIONATED): Heparin Unfractionated: 0.47 IU/mL (ref 0.30–0.70)

## 2015-07-16 LAB — PROTIME-INR
INR: 1.74 — ABNORMAL HIGH (ref 0.00–1.49)
Prothrombin Time: 20.4 seconds — ABNORMAL HIGH (ref 11.6–15.2)

## 2015-07-16 MED ORDER — WARFARIN SODIUM 5 MG PO TABS
8.0000 mg | ORAL_TABLET | Freq: Once | ORAL | Status: AC
  Administered 2015-07-16: 8 mg via ORAL
  Filled 2015-07-16: qty 1

## 2015-07-16 NOTE — Care Management Note (Signed)
Case Management Note  Patient Details  Name: Shonika Werlein [name]: [id number] Date of Birth: 04/30/1951  Subjective/Objective:      Patient with PE, conts on heparin gtt, she is independent, lives with her Keydi, [place],  She helps to take care of grandchild.  Patient states she follows up with internal medicine clinic.  NCM will cont to follow for dc needs.              Action/Plan: conts on heparin gtt for PE  Expected Discharge Date:                  Expected Discharge Plan:  Home/Self Care  In-House Referral:     Discharge planning Services  CM Consult, Follow-up appt scheduled  Post Acute Care Choice:    Choice offered to:     DME Arranged:    DME Agency:     HH Arranged:    HH Agency:     Status of Service:  Completed, signed off  Medicare Important Message Given:  Yes Date Medicare IM Given:    Medicare IM give by:    Date Additional Medicare IM Given:    Additional Medicare Important Message give by:     If discussed at [place] of Stay Meetings, dates discussed:    Additional Comments:  Zenon Mayo, RN 07/16/2015, 10:37 AM

## 2015-07-16 NOTE — Progress Notes (Signed)
[facility] PROGRESS NOTE  Kim Anthony [id number] DOB: September 30, 1950 DOA: 07/10/2015 PCP: No PCP Per Patient  Brief narrative 65 year old female with history of valve replacement and DVT on Coumadin ([place] live with her daughter recently) presented with sudden onset of shortness of breath with chest tightness and headache. Symptoms worsened with exertion. Denies any leg swellings or hemoptysis. Patient reports she was not taking her Coumadin on Lasix for the past 5 days after she moved to [facility] and she had run out of her medications. She continues to smoke daily. Workup in the ED with CT angiogram of the chest showed small submental left upper lobe pulmonary embolism. Patient placed on IV heparin drip in the ED and admitted to hospitalist service. She was switched to Lovenox bridging with oral Coumadin.   Assessment/Plan: Acute subsegmental PE of left upper lobe -Given her mitral mechanical valve I will switch her to IV heparin for bridging with Coumadin. Hemodynamically stable. Risk factor possibly includes history of DVT on Coumadin with subtherapeutic INR. Continue telemetry monitoring. INR trending up currently adn 1.74 on last check Patient does not have PCP in [place] and need to establish care here for ongoing anticoagulation and history of CHF. Case manager consulted and patient reports today plans to follow up with a pcp  Congestive heart failure (systolic and diastolic) chronic Euvolemic. The echo shows EF of 45 and 50% with supranormal left ventricular filling pattern and grade 2 diastolic dysfunction. Mechanical mitral valve prosthesis. Elevated pulmonary artery pressure of 57 mmHg.  really dilated left atrium with mildly dilated and reduced IV function. Has some right heart strain. . Continue Lasix. Add ACE inhibitor.   History of mitral valve replacement. Patient not sure which valve was replaced.   Intermittent  headaches. continue with Tylenol. Placed on when necessary Toradol.  Hyperlipidemia Continue statin  Elevated blood pressure Possibly worsened with headaches. Given her diastolic CHF have added metoprolol and increased lisinopril dose.  Hypokalemia Replenished  Anemia No baseline available. Check iron panel.  Elevated blood glucose Check A1c.  Tobacco abuse On nicotine patch. Counseled on cessation.  DVT prophylaxis: Heparin with Coumadin Diet: Heart healthy  Code Status: Code Family Communication: None at bedside  Disposition Plan: Home once symptoms improved and INR therapeutic on Coumadin, needs out patient care setup, prescription for Coumadin and other home medications.   Consultants:  None  Procedures:  CT angiogram of the chest  Antibiotics:  None  HPI/Subjective: Pt has no new complaints today  Objective: Filed Vitals:   07/16/15 0656 07/16/15 1248  BP: 134/77 155/85  Pulse: 70 62  Temp: 98.3 F (36.8 C) 98.8 F (37.1 C)  Resp: 20 18    Intake/Output Summary (Last 24 hours) at 07/16/15 1740 Last data filed at 07/16/15 1445  Gross per 24 hour  Intake 1338.01 ml  Output   1850 ml  Net -511.99 ml   Filed Weights   07/14/15 0432 07/15/15 0624 07/16/15 0656  Weight: 73.982 kg (163 lb 1.6 oz) 73.846 kg (162 lb 12.8 oz) 73.483 kg (162 lb)    Exam:   General:  Elderly female, awake and alert  HEENT: No pallor, moist mucosa  Chest: Clear bilaterally, equal chest rise  CVS: Normal S1 and S2, no murmurs rub or gallop  GI: Soft, nondistended, nontender  Musculoskeletal: Warm, no edema    Data Reviewed: Basic Metabolic Panel:  Recent Labs Lab 07/10/15 2133 07/11/15 0849 07/12/15 0314 07/15/15 0530  NA 141  --  139 140  K 2.9*  --  3.9 4.4  CL 105  --  109 109  CO2 23  --  20* 22  GLUCOSE 108*  --  122* 118*  BUN 8  --  9 10  CREATININE 0.71  --  0.58 0.64  CALCIUM 9.4  --  8.8* 9.2  MG  --  1.9  --   --   PHOS  --  2.8   --   --    Liver Function Tests: No results for input(s): AST, ALT, ALKPHOS, BILITOT, PROT, ALBUMIN in the last 168 hours. No results for input(s): LIPASE, AMYLASE in the last 168 hours. No results for input(s): AMMONIA in the last 168 hours. CBC:  Recent Labs Lab 07/10/15 2133 07/12/15 0314 07/15/15 0530  WBC 8.9 7.7 7.7  HGB 11.8* 11.0* 11.8*  HCT 35.2* 35.4* 35.6*  MCV 95.7 99.2 97.0  PLT 318 284 356   Cardiac Enzymes: No results for input(s): CKTOTAL, CKMB, CKMBINDEX, TROPONINI in the last 168 hours. BNP (last 3 results)  Recent Labs  07/10/15 2133  BNP 233.4*    ProBNP (last 3 results) No results for input(s): PROBNP in the last 8760 hours.  CBG: No results for input(s): GLUCAP in the last 168 hours.  No results found for this or any previous visit (from the past 240 hour(s)).   Studies: No results found.  Scheduled Meds: . furosemide  40 mg Oral Daily  . lisinopril  20 mg Oral Daily  . methocarbamol  1,000 mg Oral TID  . metoprolol tartrate  25 mg Oral BID  . nicotine  14 mg Transdermal Daily  . potassium chloride  40 mEq Oral BID  . simvastatin  20 mg Oral q1800  . sodium chloride  3 mL Intravenous Q12H  . warfarin  8 mg Oral ONCE-1800  . Warfarin - Pharmacist Dosing Inpatient   Does not apply q1800   Continuous Infusions: . heparin 1,350 Units/hr (07/16/15 1521)      Time spent: [place], [place] [facility] Pager [phone]. If 7PM-7AM, please contact night-coverage at [email or web address], password [facility] 07/16/2015, 5:40 PM  LOS: 5 days

## 2015-07-16 NOTE — Progress Notes (Signed)
ANTICOAGULATION CONSULT NOTE - Follow Up Consult  Pharmacy Consult:  Heparin / Coumadin Indication:  History of DVT, mechanical valve, new PE  Allergies  Allergen Reactions  . Acetaminophen-Codeine Itching  . Other Itching    Darvocet  . Tramadol Itching    vomitting    Patient Measurements: Height: 5\' 5"  (165.1 cm) Weight: 162 lb (73.483 kg) IBW/kg (Calculated) : 57 Heparin Dosing Weight: 72 kg  Vital Signs: Temp: 98.3 F (36.8 C) (01/24 0656) Temp Source: Oral (01/24 0656) BP: 134/77 mmHg (01/24 0656) Pulse Rate: 70 (01/24 0656)  Labs:  Recent Labs  07/14/15 0515 07/15/15 0530 07/16/15 0748  HGB  --  11.8*  --   HCT  --  35.6*  --   PLT  --  356  --   LABPROT 16.5* 18.6* 20.4*  INR 1.32 1.55* 1.74*  HEPARINUNFRC 0.50 0.43 0.47  CREATININE  --  0.64  --     Estimated Creatinine Clearance: 70.4 mL/min (by C-G formula based on Cr of 0.64).    Assessment: 49 YOF on Coumadin PTA for history of DVT and mechanical valve.  Patient ran out of medication PTA and admitted with a new PE.  Currently on heparin bridge to Coumadin (overlap day #6/5).  INR sub-therapeutic but is trending up nicely.  Heparin level remains therapeutic and stable.  No bleeding reported.   Goal of Therapy:  INR 2-3 Heparin level 0.3-0.7 units/ml Monitor platelets by anticoagulation protocol: Yes    Plan:  - Coumadin 8mg  PO today - Continue heparin gtt at 1350 units/hr - Daily HL / CBC / PT / INR    Katina Remick D. Mina Marble, PharmD, BCPS Pager:  [phone] 07/16/2015, 8:52 AM

## 2015-07-17 LAB — HEPARIN LEVEL (UNFRACTIONATED): HEPARIN UNFRACTIONATED: 0.46 [IU]/mL (ref 0.30–0.70)

## 2015-07-17 LAB — PROTIME-INR
INR: 2.01 — ABNORMAL HIGH (ref 0.00–1.49)
Prothrombin Time: 22.7 seconds — ABNORMAL HIGH (ref 11.6–15.2)

## 2015-07-17 MED ORDER — WARFARIN SODIUM 7.5 MG PO TABS
7.5000 mg | ORAL_TABLET | Freq: Once | ORAL | Status: AC
  Administered 2015-07-17: 7.5 mg via ORAL
  Filled 2015-07-17: qty 1

## 2015-07-17 NOTE — Progress Notes (Signed)
ANTICOAGULATION CONSULT NOTE - Follow Up Consult  Pharmacy Consult:  Heparin / Coumadin Indication:  History of DVT, mechanical valve, new PE  Allergies  Allergen Reactions  . Acetaminophen-Codeine Itching  . Other Itching    Darvocet  . Tramadol Itching    vomitting    Patient Measurements: Height: 5\' 5"  (165.1 cm) Weight: 161 lb 11.2 oz (73.347 kg) (Scale B) IBW/kg (Calculated) : 57 Heparin Dosing Weight: 72 kg  Vital Signs: Temp: 97.5 F (36.4 C) (01/25 0657) Temp Source: Oral (01/25 0657) BP: 121/68 mmHg (01/25 0657) Pulse Rate: 68 (01/25 0657)  Labs:  Recent Labs  07/15/15 0530 07/16/15 0748 07/17/15 0554  HGB 11.8*  --   --   HCT 35.6*  --   --   PLT 356  --   --   LABPROT 18.6* 20.4* 22.7*  INR 1.55* 1.74* 2.01*  HEPARINUNFRC 0.43 0.47 0.46  CREATININE 0.64  --   --     Estimated Creatinine Clearance: 70.3 mL/min (by C-G formula based on Cr of 0.64).    Assessment: 67 YOF on Coumadin PTA for history of DVT and mechanical valve.  Patient ran out of medication PTA and admitted with a new PE.  Currently on heparin bridge to Coumadin.  INR and heparin level are both therapeutic; no bleeding reported.   Goal of Therapy:  INR 2-3 Heparin level 0.3-0.7 units/ml Monitor platelets by anticoagulation protocol: Yes    Plan:  - Coumadin 7.5mg  PO today.  If discharged today, recommend 7.5mg  PO daily with INR check by Friday 07/19/15 - Continue heparin gtt at 1350 units/hr, f/u up with order to d/c - Daily HL / CBC / PT / INR    Houston Zapien D. Mina Marble, PharmD, BCPS Pager:  [phone] 07/17/2015, 9:30 AM

## 2015-07-17 NOTE — Progress Notes (Signed)
[facility] PROGRESS NOTE  Brenton Arbaiza [id number] DOB: 12/15/1950 DOA: 07/10/2015 PCP: No PCP Per Patient  Brief narrative 65 year old female with history of valve replacement and DVT on Coumadin ([place] with her daughter recently) presented with sudden onset of shortness of breath with chest tightness and headache. Symptoms worsened with exertion. Denies any leg swellings or hemoptysis. Patient reports she was not taking her Coumadin on Lasix for the past 5 days after she moved to [facility] and she had run out of her medications. She continues to smoke daily. Workup in the ED with CT angiogram of the chest showed small submental left upper lobe pulmonary embolism. Patient placed on IV heparin drip in the ED and admitted to hospitalist service. She was switched to Lovenox bridging with oral Coumadin.   Assessment/Plan: Acute subsegmental PE of left upper lobe -Given her mitral mechanical valve I will switch her to IV heparin for bridging with Coumadin. Hemodynamically stable. Risk factor possibly includes history of DVT on Coumadin with subtherapeutic INR. Continue telemetry monitoring. INR trending up currently and 2.0 on last check (goal is 2.5-3.5) given history of mechanical heart valve Patient does not have PCP in [place] and need to establish care here for ongoing anticoagulation and history of CHF. Case manager consulted and patient reports today plans to follow up with a pcp  Congestive heart failure (systolic and diastolic) chronic Euvolemic. The echo shows EF of 45 and 50% with supranormal left ventricular filling pattern and grade 2 diastolic dysfunction. Mechanical mitral valve prosthesis. Elevated pulmonary artery pressure of 57 mmHg.  really dilated left atrium with mildly dilated and reduced IV function. Has some right heart strain. . Continue Lasix. Add ACE inhibitor.   History of mitral valve replacement. Patient  not sure which valve was replaced.   Intermittent headaches. continue with Tylenol. Placed on when necessary Toradol.  Hyperlipidemia Continue statin  Elevated blood pressure Possibly worsened with headaches. Given her diastolic CHF have added metoprolol and increased lisinopril dose.  Hypokalemia Replenished  Anemia No baseline available. Check iron panel.  Elevated blood glucose Check A1c.  Tobacco abuse On nicotine patch. Counseled on cessation.  DVT prophylaxis: Heparin with Coumadin Diet: Heart healthy  Code Status: Code Family Communication: None at bedside  Disposition Plan: May d/c once INR at goal (2.5- 3.5)  Consultants:  None  Procedures:  CT angiogram of the chest  Antibiotics:  None  HPI/Subjective: Pt has no new complaints today, denies any worsening sob  Objective: Filed Vitals:   07/17/15 0657 07/17/15 0945  BP: 121/68 139/86  Pulse: 68 68  Temp: 97.5 F (36.4 C)   Resp: 15     Intake/Output Summary (Last 24 hours) at 07/17/15 1030 Last data filed at 07/17/15 0910  Gross per 24 hour  Intake 1714.81 ml  Output   1750 ml  Net -35.19 ml   Filed Weights   07/15/15 0624 07/16/15 0656 07/17/15 0657  Weight: 73.846 kg (162 lb 12.8 oz) 73.483 kg (162 lb) 73.347 kg (161 lb 11.2 oz)    Exam:   General:  Elderly female, awake and alert  HEENT: No pallor, moist mucosa  Chest: Clear bilaterally, equal chest rise  CVS: Normal S1 and S2, no murmurs rub or gallop  GI: Soft, nondistended, nontender  Musculoskeletal: Warm, no edema    Data Reviewed: Basic Metabolic Panel:  Recent Labs Lab 07/10/15 2133 07/11/15 0849 07/12/15 0314 07/15/15 0530  NA 141  --  139 140  K 2.9*  --  3.9 4.4  CL 105  --  109 109  CO2 23  --  20* 22  GLUCOSE 108*  --  122* 118*  BUN 8  --  9 10  CREATININE 0.71  --  0.58 0.64  CALCIUM 9.4  --  8.8* 9.2  MG  --  1.9  --   --   PHOS  --  2.8  --   --    Liver Function Tests: No results for  input(s): AST, ALT, ALKPHOS, BILITOT, PROT, ALBUMIN in the last 168 hours. No results for input(s): LIPASE, AMYLASE in the last 168 hours. No results for input(s): AMMONIA in the last 168 hours. CBC:  Recent Labs Lab 07/10/15 2133 07/12/15 0314 07/15/15 0530  WBC 8.9 7.7 7.7  HGB 11.8* 11.0* 11.8*  HCT 35.2* 35.4* 35.6*  MCV 95.7 99.2 97.0  PLT 318 284 356   Cardiac Enzymes: No results for input(s): CKTOTAL, CKMB, CKMBINDEX, TROPONINI in the last 168 hours. BNP (last 3 results)  Recent Labs  07/10/15 2133  BNP 233.4*    ProBNP (last 3 results) No results for input(s): PROBNP in the last 8760 hours.  CBG: No results for input(s): GLUCAP in the last 168 hours.  No results found for this or any previous visit (from the past 240 hour(s)).   Studies: No results found.  Scheduled Meds: . furosemide  40 mg Oral Daily  . lisinopril  20 mg Oral Daily  . methocarbamol  1,000 mg Oral TID  . metoprolol tartrate  25 mg Oral BID  . nicotine  14 mg Transdermal Daily  . potassium chloride  40 mEq Oral BID  . simvastatin  20 mg Oral q1800  . sodium chloride  3 mL Intravenous Q12H  . warfarin  7.5 mg Oral ONCE-1800  . Warfarin - Pharmacist Dosing Inpatient   Does not apply q1800   Continuous Infusions: . heparin 1,350 Units/hr (07/17/15 1017)     Time spent: 15 minutes    Sajad Glander, [facility]  [facility] Pager [phone]. If 7PM-7AM, please contact night-coverage at [email or web address], password [facility] 07/17/2015, 10:30 AM  LOS: 6 days

## 2015-07-17 NOTE — Care Management Note (Signed)
Case Management Note  Patient Details  Name: Kim Anthony [name]: [id number] Date of Birth: [birth year]  Subjective/Objective:  Continues on Heparin gtt. INR sub-therapeutic @ 1.53.                   Action/Plan:  Home with daughter when medically ready. Will continue to follow.     Expected Discharge Date:                  Expected Discharge Plan:  Home/Self Care  In-House Referral:     Discharge planning Services  CM Consult, Follow-up appt scheduled  Post Acute Care Choice:    Choice offered to:     DME Arranged:    DME Agency:     HH Arranged:    [place] Agency:     Status of Service:  Completed, signed off  Medicare Important Message Given:  Yes Date Medicare IM Given:    Medicare IM give by:    Date Additional Medicare IM Given:    Additional Medicare Important Message give by:     If discussed at [place] of Stay Meetings, dates discussed:    Additional Comments:  Delrae Sawyers, RN 07/17/2015, 11:04 AM

## 2015-07-18 DIAGNOSIS — I10 Essential (primary) hypertension: Secondary | ICD-10-CM

## 2015-07-18 DIAGNOSIS — I5042 Chronic combined systolic (congestive) and diastolic (congestive) heart failure: Secondary | ICD-10-CM

## 2015-07-18 DIAGNOSIS — F17210 Nicotine dependence, cigarettes, uncomplicated: Secondary | ICD-10-CM

## 2015-07-18 LAB — CBC
HEMATOCRIT: 38.4 % (ref 36.0–46.0)
HEMOGLOBIN: 12.2 g/dL (ref 12.0–15.0)
MCH: 31.6 pg (ref 26.0–34.0)
MCHC: 31.8 g/dL (ref 30.0–36.0)
MCV: 99.5 fL (ref 78.0–100.0)
Platelets: 399 10*3/uL (ref 150–400)
RBC: 3.86 MIL/uL — AB (ref 3.87–5.11)
RDW: 12.9 % (ref 11.5–15.5)
WBC: 8 10*3/uL (ref 4.0–10.5)

## 2015-07-18 LAB — PROTIME-INR
INR: 2.25 — ABNORMAL HIGH (ref 0.00–1.49)
PROTHROMBIN TIME: 24.6 s — AB (ref 11.6–15.2)

## 2015-07-18 LAB — HEPARIN LEVEL (UNFRACTIONATED): HEPARIN UNFRACTIONATED: 0.59 [IU]/mL (ref 0.30–0.70)

## 2015-07-18 MED ORDER — WARFARIN SODIUM 10 MG PO TABS
10.0000 mg | ORAL_TABLET | Freq: Once | ORAL | Status: AC
  Administered 2015-07-18: 10 mg via ORAL
  Filled 2015-07-18: qty 1

## 2015-07-18 MED ORDER — HEPARIN (PORCINE) IN NACL 100-0.45 UNIT/ML-% IJ SOLN
1300.0000 [IU]/h | INTRAMUSCULAR | Status: AC
  Administered 2015-07-19: 1300 [IU]/h via INTRAVENOUS
  Filled 2015-07-18: qty 250

## 2015-07-18 NOTE — Care Management Important Message (Signed)
Important Message  Patient Details  Name: Kim Anthony [name]: [id number] Date of Birth: October 04, 1950   Medicare Important Message Given:  Yes    Dekisha Mesmer P [place] 07/18/2015, 2:30 PM

## 2015-07-18 NOTE — Progress Notes (Addendum)
ANTICOAGULATION CONSULT NOTE - Follow Up Consult  Pharmacy Consult:  Heparin / Coumadin Indication:  History of DVT, mechanical valve, new PE  Allergies  Allergen Reactions  . Acetaminophen-Codeine Itching  . Other Itching    Darvocet  . Tramadol Itching    vomitting    Patient Measurements: Height: 5\' 5"  (165.1 cm) Weight: 162 lb (73.483 kg) IBW/kg (Calculated) : 57 Heparin Dosing Weight: 72 kg  Vital Signs: Temp: 98.3 F (36.8 C) (01/26 0738) Temp Source: Oral (01/26 0738) BP: 130/82 mmHg (01/26 0738) Pulse Rate: 64 (01/26 0738)  Labs:  Recent Labs  07/16/15 0748 07/17/15 0554 07/18/15 0441  HGB  --   --  12.2  HCT  --   --  38.4  PLT  --   --  399  LABPROT 20.4* 22.7* 24.6*  INR 1.74* 2.01* 2.25*  HEPARINUNFRC 0.47 0.46 0.59    Estimated Creatinine Clearance: 70.4 mL/min (by C-G formula based on Cr of 0.64).    Assessment: 54 YOF on Coumadin PTA for history of DVT and mechanical valve.  Patient ran out of medication PTA and admitted with a new PE.  Currently on heparin bridge to Coumadin.  Heparin level is therapeutic and appears to be accumulating.  Patient's INR is approaching goal.  No bleeding reported.   Goal of Therapy:  INR 2.5 - 3.5 Heparin level 0.3-0.7 units/ml Monitor platelets by anticoagulation protocol: Yes    Plan:  - Coumadin 10mg  PO today - Decrease heparin gtt slightly to 1300 units/hr - Daily PT / INR / HL / CBC   Juhi Lagrange D. Mina Marble, PharmD, BCPS Pager:  [phone] 07/18/2015, 8:55 AM

## 2015-07-18 NOTE — Progress Notes (Signed)
[facility] PROGRESS NOTE  Kim Anthony YNW:[id number] DOB: 1951/05/20 DOA: 07/10/2015 PCP: No PCP Per Patient  Brief narrative 65 year old female with history of valve replacement and DVT on Coumadin ([place] with her daughter recently) presented with sudden onset of shortness of breath with chest tightness and headache. Symptoms worsened with exertion. Denies any leg swellings or hemoptysis. Patient reports she was not taking her Coumadin on Lasix for the past 5 days after she moved to [facility] and she had run out of her medications. She continues to smoke daily. Workup in the ED with CT angiogram of the chest showed small submental left upper lobe pulmonary embolism. Patient placed on IV heparin drip in the ED and admitted to hospitalist service.    HPI/Subjective: Patient is awaiting discharge. No dyspnea, cough, nausea, vomiting or pain.    Assessment/Plan: Acute subsegmental PE of left upper lobe - was NOT taking her coumadin for about 5 days -cont IV heparin bridging with Coumadin.  - Goal INR 2.5-3.5 - needs closer monitoring of INR.  - Case manager consulted-  patient reports today plans to follow up with a pcp  Congestive heart failure (systolic and diastolic) chronic Euvolemic.  The echo shows EF of 45 and 50% with supranormal left ventricular filling pattern and grade 2 diastolic dysfunction. Mechanical mitral valve prosthesis. Elevated pulmonary artery pressure of 57 mmHg.  really dilated left atrium with mildly dilated and reduced IV function. Has some right heart strain.  - Continue Lasix, Lopressor-  Added Lisinopril  History of mechanical mitral valve replacement. - on Coumadin  Intermittent headaches. continue with Tylenol. Placed on when necessary Toradol.  Hyperlipidemia Continue statin  Elevated blood pressure Possibly worsened with headaches. Given her diastolic CHF, added metoprolol and increased  lisinopril dose.  Hypokalemia Replenished  Anemia No baseline available. Check iron panel.  Elevated blood glucose A1c normal at 5.3  Tobacco abuse On nicotine patch. Counseled on cessation.  DVT prophylaxis: Heparin with Coumadin Diet: Heart healthy  Code Status: Code Family Communication: None at bedside  Disposition Plan: May d/c once INR at goal (2.5- 3.5)  Consultants:  None  Procedures:  CT angiogram of the chest  Antibiotics:  None   Objective: Filed Vitals:   07/18/15 0738 07/18/15 1042  BP: 130/82 125/75  Pulse: 64 67  Temp: 98.3 F (36.8 C)   Resp: 21     Intake/Output Summary (Last 24 hours) at 07/18/15 1106 Last data filed at 07/18/15 0914  Gross per 24 hour  Intake    960 ml  Output   1100 ml  Net   -140 ml   Filed Weights   07/16/15 0656 07/17/15 0657 07/18/15 0559  Weight: 73.483 kg (162 lb) 73.347 kg (161 lb 11.2 oz) 73.483 kg (162 lb)    Exam:   General:  Elderly female, awake and alert  HEENT: No pallor, moist mucosa  Chest: Clear bilaterally, equal chest rise  CVS: Normal S1 and S2, no murmurs rub or gallop  GI: Soft, nondistended, nontender  Musculoskeletal: Warm, no edema    Data Reviewed: Basic Metabolic Panel:  Recent Labs Lab 07/12/15 0314 07/15/15 0530  NA 139 140  K 3.9 4.4  CL 109 109  CO2 20* 22  GLUCOSE 122* 118*  BUN 9 10  CREATININE 0.58 0.64  CALCIUM 8.8* 9.2   Liver Function Tests: No results for input(s): AST, ALT, ALKPHOS, BILITOT, PROT, ALBUMIN in the last 168 hours. No  results for input(s): LIPASE, AMYLASE in the last 168 hours. No results for input(s): AMMONIA in the last 168 hours. CBC:  Recent Labs Lab 07/12/15 0314 07/15/15 0530 07/18/15 0441  WBC 7.7 7.7 8.0  HGB 11.0* 11.8* 12.2  HCT 35.4* 35.6* 38.4  MCV 99.2 97.0 99.5  PLT 284 356 399   Cardiac Enzymes: No results for input(s): CKTOTAL, CKMB, CKMBINDEX, TROPONINI in the last 168 hours. BNP (last 3 results)  Recent  Labs  07/10/15 2133  BNP 233.4*    ProBNP (last 3 results) No results for input(s): PROBNP in the last 8760 hours.  CBG: No results for input(s): GLUCAP in the last 168 hours.  No results found for this or any previous visit (from the past 240 hour(s)).   Studies: No results found.  Scheduled Meds: . furosemide  40 mg Oral Daily  . lisinopril  20 mg Oral Daily  . methocarbamol  1,000 mg Oral TID  . metoprolol tartrate  25 mg Oral BID  . nicotine  14 mg Transdermal Daily  . potassium chloride  40 mEq Oral BID  . simvastatin  20 mg Oral q1800  . sodium chloride  3 mL Intravenous Q12H  . warfarin  10 mg Oral ONCE-1800  . Warfarin - Pharmacist Dosing Inpatient   Does not apply q1800   Continuous Infusions: . heparin 1,300 Units/hr (07/18/15 0900)     Time spent: 25 minutes    Rajvi Armentor, MD  [facility] Pager [phone] If 7PM-7AM, please contact night-coverage at [email or web address], password [facility] 07/18/2015, 11:06 AM  LOS: 7 days

## 2015-07-19 LAB — PROTIME-INR
INR: 2.42 — ABNORMAL HIGH (ref 0.00–1.49)
INR: 2.62 — ABNORMAL HIGH (ref 0.00–1.49)
PROTHROMBIN TIME: 26 s — AB (ref 11.6–15.2)
Prothrombin Time: 27.7 seconds — ABNORMAL HIGH (ref 11.6–15.2)

## 2015-07-19 LAB — HEPARIN LEVEL (UNFRACTIONATED): HEPARIN UNFRACTIONATED: 0.5 [IU]/mL (ref 0.30–0.70)

## 2015-07-19 MED ORDER — ENOXAPARIN SODIUM 120 MG/0.8ML ~~LOC~~ SOLN
110.0000 mg | Freq: Once | SUBCUTANEOUS | Status: AC
  Administered 2015-07-19: 110 mg via SUBCUTANEOUS
  Filled 2015-07-19: qty 0.8

## 2015-07-19 MED ORDER — ENOXAPARIN SODIUM 120 MG/0.8ML ~~LOC~~ SOLN: 110.0000 mg | SUBCUTANEOUS | Status: DC

## 2015-07-19 MED ORDER — METOPROLOL TARTRATE 25 MG PO TABS: 25.0000 mg | ORAL_TABLET | Freq: Two times a day (BID) | ORAL | Status: DC

## 2015-07-19 MED ORDER — WARFARIN SODIUM 4 MG PO TABS: 4.0000 mg | ORAL_TABLET | Freq: Every day | ORAL | Status: DC

## 2015-07-19 MED ORDER — LISINOPRIL 10 MG PO TABS: 10.0000 mg | ORAL_TABLET | Freq: Every day | ORAL | Status: DC

## 2015-07-19 MED ORDER — FUROSEMIDE 40 MG PO TABS: 40.0000 mg | ORAL_TABLET | Freq: Every day | ORAL | Status: DC

## 2015-07-19 MED ORDER — SIMVASTATIN 20 MG PO TABS: 20.0000 mg | ORAL_TABLET | Freq: Every day | ORAL | Status: DC

## 2015-07-19 MED ORDER — POTASSIUM CHLORIDE CRYS ER 20 MEQ PO TBCR
40.0000 meq | EXTENDED_RELEASE_TABLET | Freq: Two times a day (BID) | ORAL | Status: DC
Start: 2015-07-19 — End: 2015-07-19

## 2015-07-19 MED ORDER — WARFARIN SODIUM 10 MG PO TABS
10.0000 mg | ORAL_TABLET | Freq: Once | ORAL | Status: AC
  Administered 2015-07-19: 10 mg via ORAL
  Filled 2015-07-19: qty 1

## 2015-07-19 MED ORDER — POTASSIUM CHLORIDE CRYS ER 20 MEQ PO TBCR: 40.0000 meq | EXTENDED_RELEASE_TABLET | Freq: Two times a day (BID) | ORAL | Status: DC

## 2015-07-19 NOTE — Discharge Summary (Addendum)
Physician Discharge Summary  Kim Anthony [id number] DOB: 1950/07/17 DOA: 07/10/2015  PCP: No PCP Per Patient  Admit date: 07/10/2015 Discharge date: 07/19/2015  Time spent: 50 minutes  Recommendations for Outpatient Follow-up:  1. INR in 5 days  Discharge Condition: stable    Discharge Diagnoses:  Principal Problem:   PE (pulmonary embolism) Active Problems:   Chronic heart failure (HCC)   Headache, common migraine   HLD (hyperlipidemia)   Hypokalemia  Mechanical heart valve Subtheraputic on Coumadin   History of present illness:  65 year old female with history of valve replacement and DVT on Coumadin ([place] with her daughter recently) presented with sudden onset of shortness of breath with chest tightness and headache. Symptoms worsened with exertion. Denies any leg swellings or hemoptysis. Patient reports she was not taking her Coumadin on Lasix for the past 5 days after she moved to [facility] and she had run out of her medications. She continues to smoke daily. Workup in the ED with CT angiogram of the chest showed small submental left upper lobe pulmonary embolism. Patient placed on IV heparin drip in the ED and admitted to hospitalist service.   Hospital Course:  Acute subsegmental PE of left upper lobe - was NOT taking her coumadin for about 5 days - Goal INR 2.5-3.5- now therapeutic  - needs closer monitoring of INR. - Case manager consulted to help find PCP- patient reports today plans to follow up with a pcp  Congestive heart failure (systolic and diastolic) chronic Euvolemic.  The echo shows EF of 45 and 50% with supranormal left ventricular filling pattern and grade 2 diastolic dysfunction. Mechanical mitral valve prosthesis. Elevated pulmonary artery pressure of 57 mmHg- dilated left atrium with mildly dilated and reduced IV function. Has some right heart strain.  - Continue Lasix, Lopressor- Added  Lisinopril  History of mechanical mitral valve replacement. - on Coumadin  Intermittent headaches. continue with Tylenol. Placed on when necessary Toradol.  Hyperlipidemia Continue statin  Elevated blood pressure Possibly worsened with headaches. Given her diastolic CHF, added metoprolol and increased lisinopril dose.  Hypokalemia Replenished  Anemia No baseline available. Check iron panel.  Elevated blood glucose A1c normal at 5.3  Tobacco abuse On nicotine patch. Counseled on cessation.    Discharge Exam: Filed Weights   07/17/15 0657 07/18/15 0559 07/19/15 0419  Weight: 73.347 kg (161 lb 11.2 oz) 73.483 kg (162 lb) 73.755 kg (162 lb 9.6 oz)   Filed Vitals:   07/19/15 0900 07/19/15 1253  BP: 132/81 134/40  Pulse: 73 60  Temp: 98 F (36.7 C) 97.9 F (36.6 C)  Resp: 20 18    General: AAO x 3, no distress Cardiovascular: RRR, no murmurs  Respiratory: clear to auscultation bilaterally GI: soft, non-tender, non-distended, bowel sound positive  Discharge Instructions You were cared for by a hospitalist during your hospital stay. If you have any questions about your discharge medications or the care you received while you were in the hospital after you are discharged, you can call the unit and asked to speak with the hospitalist on call if the hospitalist that took care of you is not available. Once you are discharged, your primary care physician will handle any further medical issues. Please note that NO REFILLS for any discharge medications will be authorized once you are discharged, as it is imperative that you return to your primary care physician (or establish a relationship with a primary care physician if you do  not have one) for your aftercare needs so that they can reassess your need for medications and monitor your lab values.      Discharge Instructions    Diet - low sodium heart healthy    Complete by:  As directed      Discharge instructions    Complete  by:  As directed   YOU MUST GET YOUR INR CHECKED IN 5 DAYS AT THE CLINIC     Increase activity slowly    Complete by:  As directed             Medication List    TAKE these medications        furosemide 40 MG tablet  Commonly known as:  LASIX  Take 1 tablet by mouth daily.     lisinopril 10 MG tablet  Commonly known as:  PRINIVIL,ZESTRIL  Take 1 tablet by mouth daily.     metoprolol tartrate 25 MG tablet  Commonly known as:  LOPRESSOR  Take 1 tablet (25 mg total) by mouth 2 (two) times daily.     potassium chloride SA 20 MEQ tablet  Commonly known as:  K-DUR,KLOR-CON  Take 2 tablets (40 mEq total) by mouth 2 (two) times daily.     simvastatin 20 MG tablet  Commonly known as:  ZOCOR  Take 1 tablet by mouth daily.     warfarin 4 MG tablet  Commonly known as:  COUMADIN  Take 1 tablet by mouth daily.       Allergies  Allergen Reactions  . Acetaminophen-Codeine Itching  . Other Itching    Darvocet  . Tramadol Itching    vomitting   Follow-up Information    Follow up with [facility].   Why:  Appointment Tuesday, January 24th @ 3:15 p.m.   Contact information:   [address] [place] [id number]      Please follow up.   Why:  YOu need your INR checked in 5 days       The results of significant diagnostics from this hospitalization (including imaging, microbiology, ancillary and laboratory) are listed below for reference.    Significant Diagnostic Studies: Dg Chest 2 View  07/10/2015  CLINICAL DATA:  64 year old female with shortness of breath and cough EXAM: CHEST  2 VIEW COMPARISON:  None. FINDINGS: Two views of the chest demonstrate emphysematous changes of the lungs. There is diffuse interstitial prominence, likely related to underlying chronic lung disease. There is no focal consolidation, pleural effusion, or pneumothorax. Mild cardiomegaly. Median sternotomy wires and mechanical cardiac valve noted.  There is osteopenia with degenerative changes of the spine. No acute osseous pathology. Left axillary surgical clips. IMPRESSION: Emphysema.  No focal consolidation or pneumothorax. Electronically Signed   By: Anner Crete M.D.   On: 07/10/2015 21:33   Ct Angio Chest Pe W/cm &/or Wo Cm  07/11/2015  CLINICAL DATA:  65 year old female with shortness of breath and mid chest pain EXAM: CT ANGIOGRAPHY CHEST WITH CONTRAST TECHNIQUE: Multidetector CT imaging of the chest was performed using the standard protocol during bolus administration of intravenous contrast. Multiplanar CT image reconstructions and MIPs were obtained to evaluate the vascular anatomy. CONTRAST:  24mL OMNIPAQUE IOHEXOL 350 MG/ML SOLN COMPARISON:  Radiograph dated 07/10/2015 FINDINGS: Evaluation of this exam is limited due to respiratory motion artifact. Evaluation is also limited due to streak artifact caused by patient's arms. There is emphysematous changes of the lungs with diffuse interstitial prominence which may represent  a degree of congestive changes. There is no pleural effusion or pneumothorax. The central airways are patent. The thoracic aorta appears unremarkable. Evaluation of the pulmonary arteries is limited due to suboptimal opacification of peripheral branches. There is abrupt termination of contrast in the subsegmental branch of the left upper lobe pulmonary artery (series 406, image 72 and coronal series 402 image 86) compatible with pulmonary embolism. A 3.0 x 3.0 cm area of pleural based density at the left a packs is concerning for a focal pulmonary infarct. A small pleural based density in the lingula noted with apparent " Feeding vessel sign" and is also suspicious for a focal area of infarct. No large embolus noted in the lingula, however ; a small distal branch embolus is not excluded. V/Q scan may provide additional information if clinically indicated. No definite large central pulmonary artery embolus identified. Mild  cardiomegaly. Mechanical mitral valve. There is coronary vascular calcification. There is dilatation of the right cardiac chambers with retrograde flow of contrast into the IVC compatible with a degree of right cardiac dysfunction. Underlying right heart straining is not excluded. Correlation with clinical exam and echocardiogram recommended. Top-normal hilar and mediastinal lymph nodes. The esophagus is unremarkable. Left mastectomy changes. Left axillary surgical clips. There is median sternotomy wires. Degenerative changes of the spine. T8 probable hemangioma. No acute fracture. The visualized upper abdomen is unremarkable. Review of the MIP images confirms the above findings. IMPRESSION: Small subsegmental left upper lobe pulmonary embolus with findings suspicious for probable small embolus in the distal branches of the lingula. Left apical and lingular subpleural densities are concerning for pulmonary infarct. Clinical correlation is recommended. V/Q scan may provide additional information. Mild dilatation of the right cardiac chambers likely related to underlying right cardiac dysfunction. Right heart straining is not excluded. Correlation with clinical exam and echocardiogram recommended. These results were called by telephone at the time of interpretation on 07/11/2015 at 6:11 am to Dr. Joseph Berkshire , who verbally acknowledged these results. Electronically Signed   By: Anner Crete M.D.   On: 07/11/2015 06:15    Microbiology: No results found for this or any previous visit (from the past 240 hour(s)).   Labs: Basic Metabolic Panel:  Recent Labs Lab 07/15/15 0530  NA 140  K 4.4  CL 109  CO2 22  GLUCOSE 118*  BUN 10  CREATININE 0.64  CALCIUM 9.2   Liver Function Tests: No results for input(s): AST, ALT, ALKPHOS, BILITOT, PROT, ALBUMIN in the last 168 hours. No results for input(s): LIPASE, AMYLASE in the last 168 hours. No results for input(s): AMMONIA in the last 168  hours. CBC:  Recent Labs Lab 07/15/15 0530 07/18/15 0441  WBC 7.7 8.0  HGB 11.8* 12.2  HCT 35.6* 38.4  MCV 97.0 99.5  PLT 356 399   Cardiac Enzymes: No results for input(s): CKTOTAL, CKMB, CKMBINDEX, TROPONINI in the last 168 hours. BNP: BNP (last 3 results)  Recent Labs  07/10/15 2133  BNP 233.4*    ProBNP (last 3 results) No results for input(s): PROBNP in the last 8760 hours.  CBG: No results for input(s): GLUCAP in the last 168 hours.     SignedDebbe Odea, MD [facility] 07/19/2015, 4:34 PM

## 2015-07-19 NOTE — Progress Notes (Addendum)
ANTICOAGULATION CONSULT NOTE - Follow Up Consult  Pharmacy Consult:  Heparin / Coumadin Indication:  History of DVT, mechanical valve, new PE  Allergies  Allergen Reactions  . Acetaminophen-Codeine Itching  . Other Itching    Darvocet  . Tramadol Itching    vomitting    Patient Measurements: Height: 5\' 5"  (165.1 cm) Weight: 162 lb 9.6 oz (73.755 kg) IBW/kg (Calculated) : 57 Heparin Dosing Weight: 72 kg  Vital Signs: Temp: 98 F (36.7 C) (01/27 0900) Temp Source: Oral (01/27 0900) BP: 132/81 mmHg (01/27 0900) Pulse Rate: 73 (01/27 0900)  Labs:  Recent Labs  07/17/15 0554 07/18/15 0441 07/19/15 0521  HGB  --  12.2  --   HCT  --  38.4  --   PLT  --  399  --   LABPROT 22.7* 24.6* 26.0*  INR 2.01* 2.25* 2.42*  HEPARINUNFRC 0.46 0.59 0.50    Estimated Creatinine Clearance: 70.5 mL/min (by C-G formula based on Cr of 0.64).    Assessment: 54 YOF on Coumadin PTA for history of DVT and mechanical valve.  Patient ran out of medication PTA and admitted with a new PE.  Currently on heparin bridge to Coumadin.  Heparin level is therapeutic and INR is slightly below goal.  No bleeding reported.   Goal of Therapy:  INR 2.5 - 3.5 Heparin level 0.3-0.7 units/ml Monitor platelets by anticoagulation protocol: Yes    Plan:  - Repeat Coumadin 10mg  PO today - Continue heparin gtt at 1300 units/hr - Daily PT / INR / HL / CBC   Renella Steig D. Mina Marble, PharmD, BCPS Pager:  [phone] 07/19/2015, 10:00 AM    =============================   Addendum: - change heparin to Lovenox - renal function stable and appropriate for Lovenox therapy.  Given INR is slightly below goal and expect it to be therapeutic soon, will give Lovenox 1.5mg  SQ Q24H.   Goal of Therapy: Anxi-Xa level: 1-2 units/mL   Plan: - D/C heparin gtt now - At 12N, start Lovenox 110mg  SQ Q24H - CBC Q72H while hospitalized   Iker Nuttall D. Mina Marble, PharmD, BCPS Pager:  [phone] 07/19/2015, 10:53 AM

## 2015-07-22 ENCOUNTER — Ambulatory Visit (INDEPENDENT_AMBULATORY_CARE_PROVIDER_SITE_OTHER): Payer: Medicare Other | Admitting: Internal Medicine

## 2015-07-22 ENCOUNTER — Encounter: Payer: Self-pay | Admitting: Internal Medicine

## 2015-07-22 VITALS — BP 127/67 | HR 86 | Temp 98.6°F | Resp 20 | Ht 65.0 in | Wt 167.4 lb

## 2015-07-22 DIAGNOSIS — G8929 Other chronic pain: Secondary | ICD-10-CM

## 2015-07-22 DIAGNOSIS — M545 Low back pain, unspecified: Secondary | ICD-10-CM

## 2015-07-22 DIAGNOSIS — Z7901 Long term (current) use of anticoagulants: Secondary | ICD-10-CM

## 2015-07-22 DIAGNOSIS — Z954 Presence of other heart-valve replacement: Secondary | ICD-10-CM

## 2015-07-22 DIAGNOSIS — I5032 Chronic diastolic (congestive) heart failure: Secondary | ICD-10-CM | POA: Diagnosis not present

## 2015-07-22 DIAGNOSIS — E876 Hypokalemia: Secondary | ICD-10-CM | POA: Diagnosis not present

## 2015-07-22 DIAGNOSIS — I2699 Other pulmonary embolism without acute cor pulmonale: Secondary | ICD-10-CM | POA: Diagnosis present

## 2015-07-22 LAB — POCT INR: INR: 2.6

## 2015-07-22 MED ORDER — DICLOFENAC SODIUM 1 % TD GEL: 2.0000 g | Freq: Three times a day (TID) | TRANSDERMAL | Status: DC | PRN

## 2015-07-22 NOTE — Assessment & Plan Note (Signed)
>>  ASSESSMENT AND PLAN FOR CHRONIC HFREF (HEART FAILURE WITH REDUCED EJECTION FRACTION) (HCC) WRITTEN ON 09/14/2023 11:43 AM BY Abayomi Pattison, DO   >>ASSESSMENT AND PLAN FOR CHF (CONGESTIVE HEART FAILURE) (HCC) WRITTEN ON 09/14/2023 11:43 AM BY Tywanna Seifer, DO   >>ASSESSMENT AND PLAN FOR BIVENTRICULAR HEART FAILURE WITH REDUCED LEFT VENTRICULAR FUNCTION (HCC) WRITTEN ON 07/22/2015  6:27 PM BY Evelena Peat M, DO  Assessment:  Euvolemic during admission and appears euvolemic today.   She says she is weighing herself daily at home.  No HF symptoms.  On BB, statin, Lasix.  ACEI added during admission. Plan:  Continue therapy as above.

## 2015-07-22 NOTE — Assessment & Plan Note (Signed)
>>  ASSESSMENT AND PLAN FOR CHF (CONGESTIVE HEART FAILURE) (HCC) WRITTEN ON 09/14/2023 11:43 AM BY Janitza Revuelta, DO   >>ASSESSMENT AND PLAN FOR BIVENTRICULAR HEART FAILURE WITH REDUCED LEFT VENTRICULAR FUNCTION (HCC) WRITTEN ON 07/22/2015  6:27 PM BY Evelena Peat M, DO  Assessment:  Euvolemic during admission and appears euvolemic today.   She says she is weighing herself daily at home.  No HF symptoms.  On BB, statin, Lasix.  ACEI added during admission. Plan:  Continue therapy as above.

## 2015-07-22 NOTE — Assessment & Plan Note (Addendum)
Assessment:  Euvolemic during admission and appears euvolemic today.   She says she is weighing herself daily at home.  No HF symptoms.  On BB, statin, Lasix.  ACEI added during admission. Plan:  Continue therapy as above.

## 2015-07-22 NOTE — Assessment & Plan Note (Signed)
Assessment:  Recurrent chronic back pain x 3 days.  She says she was told she has arthritis on previous low back imaging.  No red flag symptoms.  She says she has this back pain from time to time.  It does not wake her from sleep and she gets some relief from conservative trx (prn Tylenol, rest, heat pad).  + straight leg raise on exam, otherwise normal exam.   I suspect the acute on chronic pain may have resulted from 1 week stay in hospital bed. Plan:  Continue prn Tylenol, rest, heat.  rx sent for Voltaren gel.  If symptoms persist after 4 weeks would recommend imaging given age and prior hx of breast CA.  She will return for follow-up in 4 weeks.

## 2015-07-22 NOTE — Assessment & Plan Note (Signed)
Assessment:  K low during admission.  She was started on K supp 40 mEq BID.  She is also on Lasix and ACEI added for chronic HF. Plan:  Check K.  Adjust supplement if needed.

## 2015-07-22 NOTE — Assessment & Plan Note (Signed)
>>  ASSESSMENT AND PLAN FOR BIVENTRICULAR HEART FAILURE WITH REDUCED LEFT VENTRICULAR FUNCTION (HCC) WRITTEN ON 07/22/2015  6:27 PM BY Yolanda Manges, DO  Assessment:  Euvolemic during admission and appears euvolemic today.   She says she is weighing herself daily at home.  No HF symptoms.  On BB, statin, Lasix.  ACEI added during admission. Plan:  Continue therapy as above.

## 2015-07-22 NOTE — Assessment & Plan Note (Addendum)
Assessment:  Doing well s/p admission for PE off of coumadin.  She denies dyspnea or chest pain.  She is back on coumadin and therapeutic at discharge (goal INR 2.5 - 3.5 for mechanical MV). Plan:  Continue coumadin.  Check INR today.  Will have her set up for future INR checks/coumadin monitoring here in clinic because she plans to stay in [place] for an extended period of time (her daughter lives here and just had a baby two weeks ago).

## 2015-07-22 NOTE — Patient Instructions (Signed)
1. Please continue Tylenol 500-650mg  every 6 hours as needed for back.  Also, try heat packs to the back and rest when needed.  I have prescribed Voltaren gel to apply to your low back up to three times per day as needed for pain.  If pain does not get better in next 4 weeks or if it worsens please come back and we may need to get imaging of your back.     2. Please take all medications as prescribed.    3. If you have worsening of your symptoms or new symptoms arise, please call the clinic [phone]), or go to the ER immediately if symptoms are severe.   Please come back to see [place] in 4 weeks for INR check and to see how your back is doing.

## 2015-07-22 NOTE — Assessment & Plan Note (Signed)
>>  ASSESSMENT AND PLAN FOR HFREF (HEART FAILURE WITH REDUCED EJECTION FRACTION) (HCC) WRITTEN ON 09/14/2023 11:44 AM BY Khaleb Broz, DO   >>ASSESSMENT AND PLAN FOR CHRONIC HFREF (HEART FAILURE WITH REDUCED EJECTION FRACTION) (HCC) WRITTEN ON 09/14/2023 11:43 AM BY Jeanann Balinski, DO   >>ASSESSMENT AND PLAN FOR CHF (CONGESTIVE HEART FAILURE) (HCC) WRITTEN ON 09/14/2023 11:43 AM BY Usman Millett, DO   >>ASSESSMENT AND PLAN FOR BIVENTRICULAR HEART FAILURE WITH REDUCED LEFT VENTRICULAR FUNCTION (HCC) WRITTEN ON 07/22/2015  6:27 PM BY Evelena Peat M, DO  Assessment:  Euvolemic during admission and appears euvolemic today.   She says she is weighing herself daily at home.  No HF symptoms.  On BB, statin, Lasix.  ACEI added during admission. Plan:  Continue therapy as above.

## 2015-07-22 NOTE — Progress Notes (Signed)
Subjective:    Patient ID: Kim Anthony, female    DOB: 05-Sep-1950, 65 y.o.   [name]: [id number]  HPI Comments: [name] is a 65 year old woman with PMH as below here for hospital follow-up.  She was recently admitted for PE 01/18 - 07/19/15.  She normally takes coumadin for mechanical mitral valve and prior RUE DVT (s/p breast cancer trx in 2002) but had run out of medicine for 5 days when she came to [place] to be with her daughter who had just had a baby.  She is feeling better since discharge and denies dyspnea.  She does have acute c/o low back pain with radiation into the leg.  Back Pain This is a recurrent (restarted 3 days ago) problem. The problem is unchanged. The pain is present in the lumbar spine. The pain radiates to the left knee. The pain is at a severity of 8/10. The pain is moderate. The pain is the same all the time. The symptoms are aggravated by standing (worse after standing for long periods at kitchen sink). Associated symptoms include leg pain. Pertinent negatives include no bladder incontinence, bowel incontinence, chest pain, dysuria, fever, perianal numbness, weakness or weight loss. Risk factors include history of cancer and history of steroid use (breast cancer 15 years; on steroids 3-4x per year for COPD exac, reports normal DEXA previously but it has been awhile). She has tried bed rest, heat, NSAIDs and analgesics for the symptoms. The treatment provided mild relief.     Past Medical History  Diagnosis Date  . Breast cancer ([place])   . Coronary artery disease   . COPD (chronic obstructive pulmonary disease) ([place])   . CHF (congestive heart failure) ([place])   . Hypertension    Current Outpatient Prescriptions on File Prior to Visit  Medication Sig Dispense Refill  . furosemide (LASIX) 40 MG tablet Take 1 tablet (40 mg total) by mouth daily. 30 tablet 0  . lisinopril (PRINIVIL,ZESTRIL) 10 MG tablet Take 1 tablet (10 mg total) by mouth daily. 30 tablet 0  . metoprolol  tartrate (LOPRESSOR) 25 MG tablet Take 1 tablet (25 mg total) by mouth 2 (two) times daily. 60 tablet 0  . potassium chloride SA (K-DUR,KLOR-CON) 20 MEQ tablet Take 2 tablets (40 mEq total) by mouth 2 (two) times daily. 60 tablet 0  . simvastatin (ZOCOR) 20 MG tablet Take 1 tablet (20 mg total) by mouth daily. 30 tablet 0  . warfarin (COUMADIN) 4 MG tablet Take 1 tablet (4 mg total) by mouth daily. 30 tablet 0   No current facility-administered medications on file prior to visit.    Review of Systems  Constitutional: Negative for fever, chills, weight loss, appetite change and unexpected weight change.  Respiratory: Negative for shortness of breath.   Cardiovascular: Negative for chest pain, palpitations and leg swelling.  Gastrointestinal: Negative for nausea, vomiting, diarrhea, constipation, blood in stool and bowel incontinence.  Genitourinary: Negative for bladder incontinence, dysuria, frequency and difficulty urinating.  Musculoskeletal: Positive for back pain.       Chronic low back  Neurological: Negative for syncope, weakness and light-headedness.       Filed Vitals:   07/22/15 1107  BP: 127/67  Pulse: 86  Temp: 98.6 F (37 C)  TempSrc: Oral  Resp: 20  Height: 5\' 5"  (1.651 m)  Weight: 167 lb 6.4 oz (75.932 kg)  SpO2: 95%     Objective:   Physical Exam  Constitutional: She is oriented to person, place, and  time. She appears well-developed. No distress.  HENT:  Head: Normocephalic and atraumatic.  Mouth/Throat: Oropharynx is clear and moist. No oropharyngeal exudate.  Eyes: Conjunctivae and EOM are normal. Pupils are equal, round, and reactive to light. Right eye exhibits no discharge. Left eye exhibits no discharge. No scleral icterus.  Neck: Neck supple.  Cardiovascular: Normal rate and regular rhythm.  Exam reveals no gallop and no friction rub.   No murmur heard. + mechanical mitral valve click  Pulmonary/Chest: Effort normal and breath sounds normal. No  respiratory distress. She has no wheezes. She has no rales.  Abdominal: Soft. Bowel sounds are normal. She exhibits no distension and no mass. There is no tenderness. There is no rebound and no guarding.  Musculoskeletal: Normal range of motion. She exhibits no edema or tenderness.  No central spine tenderness.  Neurological: She is alert and oriented to person, place, and time. She has normal reflexes. No cranial nerve deficit.  Upper/lower extremity strength and sensation grossly intact and equal.  + straight leg raise on left.  Skin: Skin is warm. She is not diaphoretic.  Psychiatric: She has a normal mood and affect. Her behavior is normal. Judgment and thought content normal.  Vitals reviewed.         Assessment & Plan:  Please see problem based chart for A&P.

## 2015-07-23 LAB — POTASSIUM: Potassium: 5 mmol/L (ref 3.5–5.2)

## 2015-07-23 NOTE — Progress Notes (Signed)
Case discussed with Dr. Wilson soon after the resident saw the patient. We reviewed the resident's history and exam and pertinent patient test results. I agree with the assessment, diagnosis, and plan of care documented in the resident's note. 

## 2015-08-05 ENCOUNTER — Ambulatory Visit (INDEPENDENT_AMBULATORY_CARE_PROVIDER_SITE_OTHER): Payer: Medicare Other | Admitting: Pharmacist

## 2015-08-05 DIAGNOSIS — I2699 Other pulmonary embolism without acute cor pulmonale: Secondary | ICD-10-CM | POA: Diagnosis not present

## 2015-08-05 DIAGNOSIS — Z7901 Long term (current) use of anticoagulants: Secondary | ICD-10-CM

## 2015-08-05 LAB — POCT INR: INR: 1.7

## 2015-08-05 NOTE — Progress Notes (Signed)
Patient ID: Kim Anthony, female   DOB: 01-05-1951, 65 y.o.   [name]: [id number] Anticoagulation Management Kim Anthony is a 65 y.o. female who reports to the clinic for monitoring of warfarin treatment.    Indication: DVT, PE and mitral valve replacement Duration: indefinite  Anticoagulation Clinic Visit History: Patient does not report signs/symptoms of bleeding or thromboembolism  Anticoagulation Episode Summary    Current INR goal 2.5-3.5  Next INR check 08/08/2015  INR from last check 1.7! (08/05/2015)  Weekly max dose   Target end date   [facility]  Preferred lab   Send INR reminders to    Indications  PE (pulmonary embolism) [I26.99]        Comments        ASSESSMENT Recent Results: The most recent result is correlated with 28 mg per week: Lab Results  Component Value Date   INR 1.7 08/05/2015   INR 2.6 07/22/2015   INR 2.62* 07/19/2015    Anticoagulation Dosing: INR as of 08/05/2015 and Previous Dosing Information    INR Dt INR Goal [employer] Sun Mon Tue Wed Thu Fri Sat   08/05/2015 1.7 2.5-3.5 28 mg 4 mg 4 mg 4 mg 4 mg 4 mg 4 mg 4 mg    Anticoagulation Dose Instructions as of 08/05/2015      Total Sun Mon Tue Wed Thu Fri Sat   New Dose 32 mg 4 mg 6 mg 4 mg 6 mg 4 mg 4 mg 4 mg     (4 mg x 1)  (4 mg x 1.5)  (4 mg x 1)  (4 mg x 1.5)  (4 mg x 1)  (4 mg x 1)  (4 mg x 1)                           INR today: Subtherapeutic  PLAN Weekly dose was increased by 14% to 32 mg per week  Patient Instructions  Enoxaparin 120 mg, inject into the skin as instructed one time daily  Patient educated about medication as defined in this encounter and verbalized understanding by repeating back instructions provided.    Patient advised to contact clinic or seek medical attention if signs/symptoms of bleeding or thromboembolism occur.  Follow-up Return in about 3 days (around 08/08/2015).  30 minutes spent face-to-face with the patient during the  encounter. 50% of time spent on education. 50% of time was spent on assessment and plan.  Medication Samples have been provided to the patient.  Drug name: Lovenox injections  Qty: 4  LOT: [id number]  Exp.Date: 11/2016  The patient has been instructed regarding the correct time, dose, and frequency of taking this medication, including desired effects and most common side effects. The patient was able to repeat back correct instructions and took the first dose in clinic.   Kim Anthony 1:00 PM 08/05/2015

## 2015-08-05 NOTE — Patient Instructions (Signed)
Enoxaparin 120 mg, inject into the skin as instructed one time daily  Patient educated about medication as defined in this encounter and verbalized understanding by repeating back instructions provided.

## 2015-08-07 ENCOUNTER — Ambulatory Visit: Payer: Medicare Other | Admitting: Internal Medicine

## 2015-08-08 ENCOUNTER — Encounter: Payer: Self-pay | Admitting: Pulmonary Disease

## 2015-08-08 ENCOUNTER — Ambulatory Visit (INDEPENDENT_AMBULATORY_CARE_PROVIDER_SITE_OTHER): Payer: Medicare Other | Admitting: Pulmonary Disease

## 2015-08-08 ENCOUNTER — Other Ambulatory Visit: Payer: Self-pay | Admitting: Pharmacist

## 2015-08-08 ENCOUNTER — Ambulatory Visit (INDEPENDENT_AMBULATORY_CARE_PROVIDER_SITE_OTHER): Payer: Medicare Other | Admitting: Pharmacist

## 2015-08-08 VITALS — BP 144/115 | HR 72 | Temp 98.7°F | Ht 65.0 in | Wt 165.8 lb

## 2015-08-08 DIAGNOSIS — Z7901 Long term (current) use of anticoagulants: Secondary | ICD-10-CM

## 2015-08-08 DIAGNOSIS — I809 Phlebitis and thrombophlebitis of unspecified site: Secondary | ICD-10-CM

## 2015-08-08 DIAGNOSIS — I2699 Other pulmonary embolism without acute cor pulmonale: Secondary | ICD-10-CM

## 2015-08-08 LAB — POCT INR: INR: 1.8

## 2015-08-08 MED ORDER — NICOTINE POLACRILEX 4 MG MT LOZG: LOZENGE | OROMUCOSAL | Status: DC

## 2015-08-08 MED ORDER — NICOTINE 7 MG/24HR TD PT24: MEDICATED_PATCH | TRANSDERMAL | Status: DC

## 2015-08-08 MED ORDER — NICOTINE 14 MG/24HR TD PT24: MEDICATED_PATCH | TRANSDERMAL | Status: DC

## 2015-08-08 NOTE — Progress Notes (Signed)
Patient requested Rx for nicotine replacement therapy. Approved by attending physician and covering physician (PCP unavailable currently).

## 2015-08-08 NOTE — Assessment & Plan Note (Addendum)
Assessment: RUE tenderness and swelling over brachioradialis. No pain over elbow joint. Concern that she has plebitis likely of the cephalic vein. Possible thrombus -- INR subtherapeutic but she has been on Lovenox shots.   Plan: -Voltaren gel QID prn pain -Warm compresses QID -Tylenol 1000mg  q8hr prn -RUE venous duplex -She has follow up already scheduled for Monday. -Obtain medical records from [facility].

## 2015-08-08 NOTE — Progress Notes (Signed)
Patient was seen in clinic with Hermina Barters, PharmD candidate. I agree with the assessment and plan of care documented by [facility]. Pulmonary embolism ~ 1 month ago so will bridge with enoxaparin for acute treatment. Patient is also requesting NRT---will collaborate with PCP.

## 2015-08-08 NOTE — Patient Instructions (Signed)
Patient educated about medication as defined in this encounter and verbalized understanding by repeating back instructions provided.   

## 2015-08-08 NOTE — Progress Notes (Addendum)
Anticoagulation Management Kim Anthony is a 65 y.o. female who reports to the clinic for monitoring of warfarin treatment.    Indication: PE Duration: indefinite  Anticoagulation Clinic Visit History: Patient states rather than taking 1.5 tablet of warfarin Mon and Wed, patient has been taking 1 tablet daily. Educated patient on the importance of adherence. Patient states she is having right arm pain today and has an appointment this afternoon for evaluation. She is also requesting nicotine replacement therapy which was approved by attending physician and physician currently following patient (PCP unavailable at this time)  Anticoagulation Episode Summary    Current INR goal 2.5-3.5  Next INR check 08/12/2015  INR from last check 1.8! (08/08/2015)  Weekly max dose   Target end date   [facility]  Preferred lab   Send INR reminders to    Indications  PE (pulmonary embolism) [I26.99]        Comments        ASSESSMENT Recent Results: Lab Results  Component Value Date   INR 1.8 08/08/2015   INR 1.7 08/05/2015   INR 2.6 07/22/2015   Anticoagulation Dosing: INR as of 08/08/2015 and Previous Dosing Information    INR Dt INR Goal [employer] Sun Mon Tue Wed Thu Fri Sat   08/08/2015 1.8 2.5-3.5 32 mg 4 mg 6 mg 4 mg 6 mg 4 mg 4 mg 4 mg    Anticoagulation Dose Instructions as of 08/08/2015      Total Sun Mon Tue Wed Thu Fri Sat   New Dose 34 mg 4 mg 4 mg 4 mg 4 mg 6 mg 6 mg 6 mg     (4 mg x 1)  (4 mg x 1)  (4 mg x 1)  (4 mg x 1)  (4 mg x 1.5)  (4 mg x 1.5)  (4 mg x 1.5)                           INR today: Subtherapeutic  PLAN Weekly dose was increased by 14% to 34 mg per week  Nicotine replacement therapy was reviewed with the patient, including name, instructions, indication, goals of therapy, potential side effects, and safe use.  Patient verbalized understanding by repeating back information and was advised to contact me if further  medication-related questions arise. Patient was also provided an information handout.  Patient advised to contact clinic or seek medical attention if signs/symptoms of bleeding or thromboembolism occur.  Follow-up 08/15/15  Siyona Coto J  30 minutes spent face-to-face with the patient during the encounter. 50% of time spent on education. 50% of time was spent on assessment, plan, and coordination of care.

## 2015-08-08 NOTE — Patient Instructions (Addendum)
Use Voltaren gel on your arm 4 times a day.  Take Tylenol Extra Strength 2 tablets (1000mg  total) every 8 hours as needed for pain.  Put warm, wet pressure on your arm - Wet a clean wash cloth with warm (not scalding hot) water and put it over your arm. When the wash cloth cools, reheat it with warm water and put it back over your arm. Repeat these steps for 5 minutes, 2 to 4 times a day.  Keep taking your Coumadin/warfarin and Lovenox as instructed.

## 2015-08-08 NOTE — Progress Notes (Signed)
   Subjective:    Patient ID: Kim Anthony, female    DOB: March 21, 1951, 65 y.o.   [name]: [id number]  HPI [name] is a 65 year old woman with history of breast cancer (L side), CAD, COPD, CHF, HTN, PE on coumadin here for evaluation of R arm pain.  Right arm hurts most just superior to antecubital space. Radiates to elbow. Started 2 days ago. Worsening. Making a fist makes it worse. Bending elbow also increases pain. No trauma. No similar pain in the past. Some increased swelling on RUE compared to LUE. No repetitive actions. Some mild warmth. Has been taking ibuprofen.  Had history of DVT in UE. ?Left side. Does not remember if there was pain. Is on coumadin and Lovenox.  Review of Systems Constitutional: no fevers/chills Cardiovascular: no chest pain Integument: no rash  Past Medical History  Diagnosis Date  . Breast cancer ([place])   . Coronary artery disease   . COPD (chronic obstructive pulmonary disease) ([place])   . CHF (congestive heart failure) ([place])   . Hypertension     Current Outpatient Prescriptions on File Prior to Visit  Medication Sig Dispense Refill  . diclofenac sodium (VOLTAREN) 1 % GEL Apply 2 g topically 3 (three) times daily as needed. 100 g 0  . furosemide (LASIX) 40 MG tablet Take 1 tablet (40 mg total) by mouth daily. 30 tablet 0  . lisinopril (PRINIVIL,ZESTRIL) 10 MG tablet Take 1 tablet (10 mg total) by mouth daily. 30 tablet 0  . metoprolol tartrate (LOPRESSOR) 25 MG tablet Take 1 tablet (25 mg total) by mouth 2 (two) times daily. 60 tablet 0  . potassium chloride SA (K-DUR,KLOR-CON) 20 MEQ tablet Take 2 tablets (40 mEq total) by mouth 2 (two) times daily. 60 tablet 0  . simvastatin (ZOCOR) 20 MG tablet Take 1 tablet (20 mg total) by mouth daily. 30 tablet 0  . warfarin (COUMADIN) 4 MG tablet Take 1 tablet (4 mg total) by mouth daily. 30 tablet 0   No current facility-administered medications on file prior to visit.   Objective:   Physical Exam Blood  pressure 144/115, pulse 72, temperature 98.7 F (37.1 C), temperature source Oral, height 5\' 5"  (1.651 m), weight 165 lb 12.8 oz (75.206 kg), SpO2 100 %.   General Apperance: NAD HEENT: Normocephalic, atraumatic, anicteric sclera Neck: Supple, trachea midline Lungs: Clear to auscultation bilaterally.  Heart: Regular rate and rhythm, no murmur/rub/gallop Abdomen: Soft, nondistended Extremities: Warm and well perfused, +tenderness and swelling over brachioradialis, no erythema, minimal increased warmth. Skin: No rashes or lesions Neurologic: Alert and interactive. No gross deficits.    Assessment & Plan:  Phlebitis Assessment: RUE tenderness and swelling over brachioradialis. No pain over elbow joint. Concern that she has plebitis likely of the cephalic vein. Possible thrombus -- INR subtherapeutic but she has been on Lovenox shots.   Plan: -Voltaren gel QID prn pain -Warm compresses QID -Tylenol 1000mg  q8hr prn -RUE venous duplex -She has follow up already scheduled for Monday.

## 2015-08-09 ENCOUNTER — Telehealth: Payer: Self-pay | Admitting: Internal Medicine

## 2015-08-09 NOTE — Progress Notes (Signed)
Internal Medicine Clinic Attending  Case discussed with Dr. Krall at the time of the visit.  We reviewed the resident's history and exam and pertinent patient test results.  I agree with the assessment, diagnosis, and plan of care documented in the resident's note.  

## 2015-08-09 NOTE — Progress Notes (Signed)
Reviewed & agree w plan Thanks DrG 

## 2015-08-09 NOTE — Telephone Encounter (Signed)
APPT. REMINDER CALL,NUMBER UNAVAILABLE

## 2015-08-12 ENCOUNTER — Ambulatory Visit (INDEPENDENT_AMBULATORY_CARE_PROVIDER_SITE_OTHER): Payer: Medicare Other | Admitting: Internal Medicine

## 2015-08-12 ENCOUNTER — Encounter: Payer: Self-pay | Admitting: Internal Medicine

## 2015-08-12 ENCOUNTER — Ambulatory Visit (INDEPENDENT_AMBULATORY_CARE_PROVIDER_SITE_OTHER): Payer: Medicare Other | Admitting: Pharmacist

## 2015-08-12 VITALS — BP 106/83 | HR 61 | Temp 97.8°F | Ht 65.0 in | Wt 162.9 lb

## 2015-08-12 DIAGNOSIS — I2699 Other pulmonary embolism without acute cor pulmonale: Secondary | ICD-10-CM | POA: Diagnosis not present

## 2015-08-12 DIAGNOSIS — Z9114 Patient's other noncompliance with medication regimen: Secondary | ICD-10-CM | POA: Diagnosis not present

## 2015-08-12 DIAGNOSIS — F17200 Nicotine dependence, unspecified, uncomplicated: Secondary | ICD-10-CM | POA: Diagnosis not present

## 2015-08-12 DIAGNOSIS — E785 Hyperlipidemia, unspecified: Secondary | ICD-10-CM

## 2015-08-12 DIAGNOSIS — I809 Phlebitis and thrombophlebitis of unspecified site: Secondary | ICD-10-CM

## 2015-08-12 DIAGNOSIS — Z7901 Long term (current) use of anticoagulants: Secondary | ICD-10-CM | POA: Diagnosis not present

## 2015-08-12 DIAGNOSIS — E876 Hypokalemia: Secondary | ICD-10-CM | POA: Diagnosis not present

## 2015-08-12 DIAGNOSIS — I5042 Chronic combined systolic (congestive) and diastolic (congestive) heart failure: Secondary | ICD-10-CM | POA: Diagnosis present

## 2015-08-12 LAB — POCT INR: INR: 2.6

## 2015-08-12 MED ORDER — NICOTINE POLACRILEX 4 MG MT LOZG: LOZENGE | OROMUCOSAL | Status: DC

## 2015-08-12 MED ORDER — LISINOPRIL 10 MG PO TABS: 10.0000 mg | ORAL_TABLET | Freq: Every day | ORAL | Status: DC

## 2015-08-12 MED ORDER — DICLOFENAC SODIUM 1 % TD GEL: 2.0000 g | Freq: Three times a day (TID) | TRANSDERMAL | Status: DC | PRN

## 2015-08-12 MED ORDER — NICOTINE 14 MG/24HR TD PT24: MEDICATED_PATCH | TRANSDERMAL | Status: DC

## 2015-08-12 MED ORDER — WARFARIN SODIUM 4 MG PO TABS: 4.0000 mg | ORAL_TABLET | Freq: Every day | ORAL | Status: DC

## 2015-08-12 MED ORDER — POTASSIUM CHLORIDE CRYS ER 20 MEQ PO TBCR: 40.0000 meq | EXTENDED_RELEASE_TABLET | Freq: Two times a day (BID) | ORAL | Status: DC

## 2015-08-12 MED ORDER — METOPROLOL TARTRATE 25 MG PO TABS: 25.0000 mg | ORAL_TABLET | Freq: Two times a day (BID) | ORAL | Status: DC

## 2015-08-12 MED ORDER — NICOTINE 7 MG/24HR TD PT24: MEDICATED_PATCH | TRANSDERMAL | Status: DC

## 2015-08-12 MED ORDER — FUROSEMIDE 40 MG PO TABS: 40.0000 mg | ORAL_TABLET | Freq: Every day | ORAL | Status: DC

## 2015-08-12 MED ORDER — SIMVASTATIN 20 MG PO TABS: 20.0000 mg | ORAL_TABLET | Freq: Every day | ORAL | Status: DC

## 2015-08-12 NOTE — Progress Notes (Signed)
Anticoagulation Management Kim Anthony is a 65 y.o. female who reports to the clinic for monitoring of warfarin treatment.    Indication: PE Duration: indefinite  Anticoagulation Clinic Visit History: Patient does not report new signs/symptoms of bleeding or thromboembolism  Anticoagulation Episode Summary    Current INR goal 2.5-3.5  Next INR check 08/20/2015  INR from last check 2.6 (08/12/2015)  Weekly max dose   Target end date   [facility]  Preferred lab   Send INR reminders to    Indications  PE (pulmonary embolism) [I26.99]        Comments        ASSESSMENT Recent Results: The most recent result is correlated with 34 mg per week: Lab Results  Component Value Date   INR 2.6 08/12/2015   INR 1.8 08/08/2015   INR 1.7 08/05/2015    Anticoagulation Dosing: INR as of 08/12/2015 and Previous Dosing Information    INR Dt INR Goal [employer] Sun Mon Tue Wed Thu Fri Sat   08/12/2015 2.6 2.5-3.5 34 mg 4 mg 4 mg 4 mg 4 mg 6 mg 6 mg 6 mg    Anticoagulation Dose Instructions as of 08/12/2015      Total Sun Mon Tue Wed Thu Fri Sat   New Dose 34 mg 6 mg 4 mg 4 mg 6 mg 4 mg 6 mg 4 mg     (4 mg x 1.5)  (4 mg x 1)  (4 mg x 1)  (4 mg x 1.5)  (4 mg x 1)  (4 mg x 1.5)  (4 mg x 1)                           INR today: Therapeutic  PLAN Weekly dose was unchanged. Of note, patient stated [facility] did not receive the Rx's for nicotine replacement last week so I re-sent them electronically today.   Patient Instructions  Patient educated about medication as defined in this encounter and verbalized understanding by repeating back instructions provided.   Patient advised to contact clinic or seek medical attention if signs/symptoms of bleeding or thromboembolism occur.  Patient verbalized understanding by repeating back information and was advised to contact me if further medication-related questions arise. Patient was also provided an information  handout.  Follow-up Return in about 8 days (around 08/20/2015). Patient requests to be seen on Tuesday.  Eragon Hammond J  15 minutes spent face-to-face with the patient during the encounter. 50% of time spent on education. 50% of time was spent on assessment and plan.

## 2015-08-12 NOTE — Patient Instructions (Signed)
Patient educated about medication as defined in this encounter and verbalized understanding by repeating back instructions provided.   

## 2015-08-12 NOTE — Progress Notes (Signed)
Subjective:   Patient ID: Kim Anthony female   DOB: 04/08/1951 65 y.o.   [name]: [id number]  HPI: [name] is a 65 y.o. female w/ PMHx of CAD, COPD, CHF, and HTN, presents to the clinic today for a follow-up visit regarding her recent admission for PE and RUE pain/phlebitis. She still complains of RUE pain and has not yet received her doppler study. She says her upper arm is tender to the touch specifically where she had an IV during her admission. She denies any chest pain or SOB. Patient denies any numbness, tingling, or decreased function in her hand. Says it feels swollen over her elbow. At her previous visit, patient was found to have subtherapeutic INR, started back on Lovenox bridge. Today INR 2.6.  Past Medical History  Diagnosis Date  . Breast cancer ([place])   . Coronary artery disease   . COPD (chronic obstructive pulmonary disease) ([place])   . CHF (congestive heart failure) ([place])   . Hypertension    Current Outpatient Prescriptions  Medication Sig Dispense Refill  . diclofenac sodium (VOLTAREN) 1 % GEL Apply 2 g topically 3 (three) times daily as needed. 100 g 0  . furosemide (LASIX) 40 MG tablet Take 1 tablet (40 mg total) by mouth daily. 30 tablet 0  . lisinopril (PRINIVIL,ZESTRIL) 10 MG tablet Take 1 tablet (10 mg total) by mouth daily. 30 tablet 0  . metoprolol tartrate (LOPRESSOR) 25 MG tablet Take 1 tablet (25 mg total) by mouth 2 (two) times daily. 60 tablet 0  . nicotine (NICODERM CQ - DOSED IN MG/24 HOURS) 14 mg/24hr patch RX #1 Weeks 1-6: 14 mg x 1 patch daily. Wear for 24 hours. If you have sleep disturbances, remove at bedtime. Use Medicaid-approved NDC product (Medicaid ID [id number] P) 42 patch 0  . nicotine (NICODERM CQ - DOSED IN MG/24 HR) 7 mg/24hr patch RX #2 Weeks 7-8: 7 mg x 1 patch dailyWear for 24 hours. If you have sleep disturbances, remove at bedtime. Use Medicaid-approved NDC product (Medicaid ID [id number] P) 14 patch 0  . nicotine polacrilex (COMMIT) 4 MG  lozenge RX #1 Weeks 1-6: 1 lozenge every 1-2 hours. Use at least 9 lozenges per day for the first 6 weeks. Max 20 lozenges per day. Use Medicaid-approved NDC product (Medicaid ID [id number] P) 1008 lozenge 0  . nicotine polacrilex (COMMIT) 4 MG lozenge RX #2 Weeks 7-9: 1 lozenge every 2-4 hours.Max 20 lozenges per day. Use Medicaid-approved NDC product (Medicaid ID [id number] P) 252 lozenge 0  . nicotine polacrilex (COMMIT) 4 MG lozenge RX #3 Weeks 10-12: 1 lozenge every 4-8 hours.Max 20 lozenges per day. Use Medicaid-approved NDC product (Medicaid ID [id number] P) 126 lozenge 0  . potassium chloride SA (K-DUR,KLOR-CON) 20 MEQ tablet Take 2 tablets (40 mEq total) by mouth 2 (two) times daily. 60 tablet 0  . simvastatin (ZOCOR) 20 MG tablet Take 1 tablet (20 mg total) by mouth daily. 30 tablet 0  . warfarin (COUMADIN) 4 MG tablet Take 1 tablet (4 mg total) by mouth daily. 30 tablet 0   No current facility-administered medications for this visit.    Review of Systems: General: Denies fever, chills, diaphoresis, appetite change and fatigue.  Respiratory: Denies SOB, DOE, cough, and wheezing.   Cardiovascular: Denies chest pain and palpitations.  Gastrointestinal: Denies nausea, vomiting, abdominal pain, and diarrhea.  Genitourinary: Denies dysuria, increased frequency, and flank pain. Endocrine: Denies hot or cold intolerance, polyuria, and polydipsia. Musculoskeletal: Positive for RUE pain. Denies back pain, joint swelling, arthralgias  and gait problem.  Skin: Denies pallor, rash and wounds.  Neurological: Denies dizziness, seizures, syncope, weakness, lightheadedness, numbness and headaches.  Psychiatric/Behavioral: Denies mood changes, and sleep disturbances.  Objective:   Physical Exam: Filed Vitals:   08/12/15 1106  BP: 106/83  Pulse: 61  Temp: 97.8 F (36.6 C)  TempSrc: Oral  Height: 5\' 5"  (1.651 m)  Weight: 162 lb 14.4 oz (73.891 kg)  SpO2: 100%    General: Alert, cooperative,  NAD. HEENT: PERRL, EOMI. Moist mucus membranes Neck: Full range of motion without pain, supple, no lymphadenopathy or carotid bruits Lungs: Clear to ascultation bilaterally, normal work of respiration, no wheezes, rales, rhonchi Heart: RRR, no murmurs, gallops, or rubs Abdomen: Soft, non-tender, non-distended, BS + Extremities: No cyanosis, clubbing, or edema. RUE with tenderness over AC fossa and proximal. Mild erythema, but minimal/no swelling. Distal pulses intact, hand equal temperatures.  Neurologic: Alert & oriented X3, cranial nerves II-XII intact, strength grossly intact, sensation intact to light touch   Assessment & Plan:   Please see problem based assessment and plan.

## 2015-08-12 NOTE — Patient Instructions (Signed)
1. Please schedule a follow up appointment for 2-4 weeks.   2. Please take all medications as previously prescribed.  Please go for ultrasound of your arm to make sure there is not a deep clot there.   Continue to use hot compresses, Voltaren gel, and OTC medications for pain.   Continue Coumadin as previously prescribed.   3. If you have worsening of your symptoms or new symptoms arise, please call the clinic [phone]), or go to the ER immediately if symptoms are severe.  You have done a great job in taking all your medications. Please continue to do this.

## 2015-08-13 ENCOUNTER — Ambulatory Visit: Payer: Medicare Other | Admitting: Pharmacist

## 2015-08-13 NOTE — Assessment & Plan Note (Signed)
>>  ASSESSMENT AND PLAN FOR CHRONIC HFREF (HEART FAILURE WITH REDUCED EJECTION FRACTION) (HCC) WRITTEN ON 09/14/2023 11:43 AM BY Shaman Muscarella, DO   >>ASSESSMENT AND PLAN FOR CHF (CONGESTIVE HEART FAILURE) (HCC) WRITTEN ON 09/14/2023 11:43 AM BY Lajuanna Pompa, DO   >>ASSESSMENT AND PLAN FOR BIVENTRICULAR HEART FAILURE WITH REDUCED LEFT VENTRICULAR FUNCTION (HCC) WRITTEN ON 08/13/2015  7:39 AM BY Courtney Paris, MD  Refilled Lopressor, ACEI, and Lasix today. Patient appears euvolemic. Lungs clear. BP controlled.

## 2015-08-13 NOTE — Assessment & Plan Note (Signed)
Refilled Zocor

## 2015-08-13 NOTE — Assessment & Plan Note (Signed)
>>  ASSESSMENT AND PLAN FOR HFREF (HEART FAILURE WITH REDUCED EJECTION FRACTION) (HCC) WRITTEN ON 09/14/2023 11:44 AM BY Anias Bartol, DO   >>ASSESSMENT AND PLAN FOR CHRONIC HFREF (HEART FAILURE WITH REDUCED EJECTION FRACTION) (HCC) WRITTEN ON 09/14/2023 11:43 AM BY Trendon Zaring, DO   >>ASSESSMENT AND PLAN FOR CHF (CONGESTIVE HEART FAILURE) (HCC) WRITTEN ON 09/14/2023 11:43 AM BY Malory Spurr, DO   >>ASSESSMENT AND PLAN FOR BIVENTRICULAR HEART FAILURE WITH REDUCED LEFT VENTRICULAR FUNCTION (HCC) WRITTEN ON 08/13/2015  7:39 AM BY Courtney Paris, MD  Refilled Lopressor, ACEI, and Lasix today. Patient appears euvolemic. Lungs clear. BP controlled.

## 2015-08-13 NOTE — Assessment & Plan Note (Signed)
Refilled K-dur

## 2015-08-13 NOTE — Assessment & Plan Note (Signed)
INR therapeutic today. Had previously been on Lovenox bridge. No SOB or chest pain. Refilled Coumadin.

## 2015-08-13 NOTE — Assessment & Plan Note (Signed)
Seems to be a superficial phlebitis, with very superficial tenderness, over previous PIV site. No palpable cords, minimal swelling. Pulses intact distally. No difference in temperature. Still has not received UE doppler. Seems that patient is on life long coumadin given previous h/o DVT prior to recent hospitalization, now with PE 2/2 non-adherence to anti-coagulation, as well as a mechanical valve per chart review. Even a positive doppler for UE DVT would not necessarily change management or duration of therapy. Would also not consider this a failure of therapy given subtherapeutic INR if DVT was in fact present.  -Doppler study pending -Continue warm compress -Voltaren gel -Continue Coumadin

## 2015-08-13 NOTE — Assessment & Plan Note (Signed)
Refilled Lopressor, ACEI, and Lasix today. Patient appears euvolemic. Lungs clear. BP controlled.

## 2015-08-13 NOTE — Assessment & Plan Note (Signed)
>>  ASSESSMENT AND PLAN FOR CHF (CONGESTIVE HEART FAILURE) (HCC) WRITTEN ON 09/14/2023 11:43 AM BY Jocie Meroney, DO   >>ASSESSMENT AND PLAN FOR BIVENTRICULAR HEART FAILURE WITH REDUCED LEFT VENTRICULAR FUNCTION (HCC) WRITTEN ON 08/13/2015  7:39 AM BY Courtney Paris, MD  Refilled Lopressor, ACEI, and Lasix today. Patient appears euvolemic. Lungs clear. BP controlled.

## 2015-08-13 NOTE — Assessment & Plan Note (Signed)
>>  ASSESSMENT AND PLAN FOR BIVENTRICULAR HEART FAILURE WITH REDUCED LEFT VENTRICULAR FUNCTION (HCC) WRITTEN ON 08/13/2015  7:39 AM BY Courtney Paris, MD  Refilled Lopressor, ACEI, and Lasix today. Patient appears euvolemic. Lungs clear. BP controlled.

## 2015-08-13 NOTE — Progress Notes (Signed)
Internal Medicine Clinic Attending  Case discussed with Dr. Jones soon after the resident saw the patient.  We reviewed the resident's history and exam and pertinent patient test results.  I agree with the assessment, diagnosis, and plan of care documented in the resident's note. 

## 2015-08-14 ENCOUNTER — Ambulatory Visit (HOSPITAL_COMMUNITY)
Admission: RE | Admit: 2015-08-14 | Discharge: 2015-08-14 | Disposition: A | Discharge status: Home / Self Care | Payer: Medicare Other | Source: Ambulatory Visit | Attending: Internal Medicine | Admitting: Internal Medicine

## 2015-08-14 ENCOUNTER — Other Ambulatory Visit: Payer: Self-pay | Admitting: Internal Medicine

## 2015-08-14 DIAGNOSIS — I809 Phlebitis and thrombophlebitis of unspecified site: Secondary | ICD-10-CM | POA: Insufficient documentation

## 2015-08-14 DIAGNOSIS — M79601 Pain in right arm: Secondary | ICD-10-CM | POA: Diagnosis not present

## 2015-08-14 NOTE — Telephone Encounter (Signed)
Pt states she still having pain on the right arm, want to know what she can take other than ibuprofen.

## 2015-08-14 NOTE — Telephone Encounter (Signed)
Tried to call patient, phone rings to message stating person unavailable, no option for voicemail.

## 2015-08-14 NOTE — Progress Notes (Signed)
VASCULAR LAB PRELIMINARY  PRELIMINARY  PRELIMINARY  PRELIMINARY  Right upper extremity venous duplex has been completed.      No evidence of DVT or superficial thrombosis.    Scanned area of pain no evidence of cyst or hematoma.  Ermine Stebbins, RVT, RDMS 08/14/2015, 10:55 AM

## 2015-08-15 NOTE — Telephone Encounter (Signed)
No answer, no vmail 

## 2015-08-15 NOTE — Progress Notes (Signed)
I have reviewed Dr. Julianne Rice note.  Patient is on coumadin for PE.  INR at goal.

## 2015-08-16 ENCOUNTER — Telehealth: Payer: Self-pay | Admitting: Internal Medicine

## 2015-08-16 NOTE — Telephone Encounter (Signed)
These were all done at a visit 2/20, called pharmacy they only got voltaren gel, gave the rest verbally, they will get them ready for pt, i will call pt

## 2015-08-16 NOTE — Telephone Encounter (Signed)
NEEDS REFILL, WARFARIN 4MG , METOPROL 25MG , FUROSEMITE 40 MG, POTASSIUM 20MG ., LISNOPRIL 10MG , SIMVASTATIN 20MG , TO Suzie Portela [id number]

## 2015-08-16 NOTE — Telephone Encounter (Signed)
Dr Ronnald Ramp, when i called pt to let her know about her meds she stated she needs something for the pain in her arm, states the gel does not help, could you prescribe something better for pain

## 2015-08-16 NOTE — Telephone Encounter (Signed)
Spoke w/ dr Ronnald Ramp, pt is to take 3 to 4 ibuprofen otc every 6 to 8 hours with tylenol every 4 hours if needed, called pt and informed her also per dr Ronnald Ramp that she does not have a "clot" in the arm, she is to f/u in the next 1 to 2 weeks, she is agreeable

## 2015-08-20 ENCOUNTER — Ambulatory Visit: Payer: Medicare Other | Admitting: Pharmacist

## 2015-09-11 ENCOUNTER — Telehealth: Payer: Self-pay | Admitting: Internal Medicine

## 2015-09-11 NOTE — Telephone Encounter (Signed)
APPT. REMIONDER CALL, NO ANSWER, NO VOICEMAIL

## 2015-09-12 ENCOUNTER — Other Ambulatory Visit: Payer: Self-pay | Admitting: Internal Medicine

## 2015-09-12 ENCOUNTER — Encounter: Payer: Medicare Other | Admitting: Internal Medicine

## 2015-09-12 DIAGNOSIS — I2699 Other pulmonary embolism without acute cor pulmonale: Secondary | ICD-10-CM

## 2015-09-12 MED ORDER — WARFARIN SODIUM 4 MG PO TABS
ORAL_TABLET | ORAL | Status: DC
Start: 2015-09-12 — End: 2015-11-27

## 2015-09-12 NOTE — Telephone Encounter (Signed)
Refill request fulfilled based on recommendations from patients last anti-coag visit.

## 2015-09-12 NOTE — Telephone Encounter (Signed)
NEEDS REFILL WARFARIN 4 MG, [facility]- [phone]

## 2015-11-07 ENCOUNTER — Encounter: Payer: Medicare Other | Admitting: Internal Medicine

## 2015-11-27 ENCOUNTER — Other Ambulatory Visit: Payer: Self-pay | Admitting: Internal Medicine

## 2015-12-25 ENCOUNTER — Other Ambulatory Visit: Payer: Self-pay | Admitting: *Deleted

## 2015-12-25 DIAGNOSIS — E785 Hyperlipidemia, unspecified: Secondary | ICD-10-CM

## 2015-12-26 ENCOUNTER — Other Ambulatory Visit: Payer: Self-pay | Admitting: Internal Medicine

## 2015-12-26 DIAGNOSIS — E785 Hyperlipidemia, unspecified: Secondary | ICD-10-CM

## 2015-12-26 MED ORDER — SIMVASTATIN 20 MG PO TABS: 20.0000 mg | ORAL_TABLET | Freq: Every day | ORAL | Status: DC

## 2015-12-30 NOTE — Telephone Encounter (Signed)
Verified that pharmacy got RX since class says print

## 2016-01-26 ENCOUNTER — Other Ambulatory Visit: Payer: Self-pay | Admitting: Internal Medicine

## 2016-01-26 DIAGNOSIS — I5042 Chronic combined systolic (congestive) and diastolic (congestive) heart failure: Secondary | ICD-10-CM

## 2016-01-29 ENCOUNTER — Telehealth: Payer: Self-pay | Admitting: Internal Medicine

## 2016-01-29 NOTE — Telephone Encounter (Signed)
APT. REMINDER CALL, NO VOICEMAIL °

## 2016-01-30 ENCOUNTER — Ambulatory Visit (INDEPENDENT_AMBULATORY_CARE_PROVIDER_SITE_OTHER): Payer: Medicare Other | Admitting: Internal Medicine

## 2016-01-30 ENCOUNTER — Encounter: Payer: Self-pay | Admitting: Internal Medicine

## 2016-01-30 VITALS — BP 150/76 | HR 62 | Temp 98.6°F | Ht 65.5 in | Wt 164.4 lb

## 2016-01-30 DIAGNOSIS — Z23 Encounter for immunization: Secondary | ICD-10-CM | POA: Diagnosis not present

## 2016-01-30 DIAGNOSIS — Z0000 Encounter for general adult medical examination without abnormal findings: Secondary | ICD-10-CM | POA: Insufficient documentation

## 2016-01-30 DIAGNOSIS — Z952 Presence of prosthetic heart valve: Secondary | ICD-10-CM | POA: Diagnosis not present

## 2016-01-30 DIAGNOSIS — I5042 Chronic combined systolic (congestive) and diastolic (congestive) heart failure: Secondary | ICD-10-CM

## 2016-01-30 DIAGNOSIS — I2699 Other pulmonary embolism without acute cor pulmonale: Secondary | ICD-10-CM

## 2016-01-30 DIAGNOSIS — F17210 Nicotine dependence, cigarettes, uncomplicated: Secondary | ICD-10-CM | POA: Diagnosis not present

## 2016-01-30 DIAGNOSIS — E785 Hyperlipidemia, unspecified: Secondary | ICD-10-CM | POA: Diagnosis not present

## 2016-01-30 DIAGNOSIS — Z79899 Other long term (current) drug therapy: Secondary | ICD-10-CM | POA: Diagnosis not present

## 2016-01-30 DIAGNOSIS — M545 Low back pain, unspecified: Secondary | ICD-10-CM

## 2016-01-30 DIAGNOSIS — J309 Allergic rhinitis, unspecified: Secondary | ICD-10-CM | POA: Diagnosis not present

## 2016-01-30 DIAGNOSIS — Z7289 Other problems related to lifestyle: Secondary | ICD-10-CM

## 2016-01-30 DIAGNOSIS — E876 Hypokalemia: Secondary | ICD-10-CM | POA: Diagnosis not present

## 2016-01-30 DIAGNOSIS — I809 Phlebitis and thrombophlebitis of unspecified site: Secondary | ICD-10-CM

## 2016-01-30 DIAGNOSIS — Z7901 Long term (current) use of anticoagulants: Secondary | ICD-10-CM

## 2016-01-30 DIAGNOSIS — G8929 Other chronic pain: Secondary | ICD-10-CM

## 2016-01-30 LAB — POCT INR: INR: 2.7

## 2016-01-30 NOTE — Progress Notes (Signed)
   CC: here for f/u back pain and sinus congestion.  HPI:  Kim Anthony is a 65 y.o. woman with a past medical history listed below here today for follow up of her chronic low back pain and complaint of sinus congestion.  For details of today's visit and the status of her chronic medical issues please refer to the assessment and plan.   Past Medical History:  Diagnosis Date  . Breast cancer ([place])   . CHF (congestive heart failure) ([place])   . COPD (chronic obstructive pulmonary disease) ([place])   . Coronary artery disease   . Hypertension     Review of Systems:   Review of Systems  Constitutional: Negative for chills and fever.  Respiratory: Negative for shortness of breath.   Cardiovascular: Negative for chest pain.  Genitourinary: Negative for dysuria, frequency and urgency.  Musculoskeletal: Positive for back pain.     Physical Exam:  Vitals:   01/30/16 1613  BP: (!) 150/76  Pulse: 62  Temp: 98.6 F (37 C)  TempSrc: Oral  SpO2: 100%  Weight: 164 lb 6.4 oz (74.6 kg)  Height: 5' 5.5" (1.664 m)   Physical Exam  Constitutional: She is oriented to person, place, and time and well-developed, well-nourished, and in no distress.  HENT:  Head: Normocephalic and atraumatic.  Cardiovascular: Normal rate and regular rhythm.   + click of mitral valve replacement.  Pulmonary/Chest: Effort normal and breath sounds normal.  Musculoskeletal: Normal range of motion. She exhibits no edema.  Neurological: She is alert and oriented to person, place, and time. Gait normal.    Assessment & Plan:   See Encounters Tab for problem based charting.  Patient discussed with Dr. Beryle Beams   PE (pulmonary embolism) A: INR therapeutic today at 2.7 (2.5-3.5 for mechanical MV).  No chest pain or SOB.  Admitted back in January 2017 with PE when off coumadin.  P: - refilled coumadin at current dose. - will have her set up for future INR checks/coumadin monitoring in our  clinic.  Chronic combined systolic and diastolic congestive heart failure (HCC) A: Needs refills on her medications of metoprolol, lisinopril, and Lasix.  Appears euvolemic today.  Lungs clear.  BP slightly elevated.  P: - continue current medications. - refilled metoprolol, lasix, and lisinopril.  HLD (hyperlipidemia) A/P: - refilled simvastatin.  Hypokalemia A/P: - refilled K-dur - BMET today looks good.  Chronic low back pain A: Recurrent back pain that she has previously told was secondary to arthritis.  No alarm symptoms or red flags.  Sounds MSK related with radiculopathy as she reports radiating symptoms down back of leg to mid-calf.  No bone pain, no weight loss, no night time awakenings.  Has been using Tylenol, voltaren gel, heat, and rest with mild relief.  P: - will obtain imaging with plain films of her lumbar spine given age and to better characterize any degenerative changes. - continue conservative management.  Could consider trial of anti-inflammatories with close monitoring given coumadin. - pending imaging, consider referral to Sports Medicine or PT.   Healthcare maintenance A/P: Tetanus given today.  Allergic rhinitis A: Patient having worsening of allergy symptoms with increased congestion and drainage.  Has been occurring for the past 2 months and has history of allergies.  Has not tried anything for relief.  P: - trial of Flonase and anti-histamine.

## 2016-01-31 LAB — BMP8+ANION GAP
ANION GAP: 22 mmol/L — AB (ref 10.0–18.0)
BUN / CREAT RATIO: 16 (ref 12–28)
BUN: 11 mg/dL (ref 8–27)
CHLORIDE: 99 mmol/L (ref 96–106)
CO2: 18 mmol/L (ref 18–29)
CREATININE: 0.68 mg/dL (ref 0.57–1.00)
Calcium: 9.5 mg/dL (ref 8.7–10.3)
GFR calc Af Amer: 105 mL/min/{1.73_m2} (ref >59)
GFR calc non Af Amer: 91 mL/min/{1.73_m2} (ref >59)
GLUCOSE: 87 mg/dL (ref 65–99)
POTASSIUM: 4 mmol/L (ref 3.5–5.2)
SODIUM: 139 mmol/L (ref 134–144)

## 2016-01-31 LAB — SPECIMEN STATUS REPORT

## 2016-01-31 LAB — HEPATITIS C ANTIBODY

## 2016-02-01 DIAGNOSIS — J309 Allergic rhinitis, unspecified: Secondary | ICD-10-CM | POA: Insufficient documentation

## 2016-02-01 DIAGNOSIS — I10 Essential (primary) hypertension: Secondary | ICD-10-CM | POA: Insufficient documentation

## 2016-02-01 MED ORDER — WARFARIN SODIUM 4 MG PO TABS: ORAL_TABLET | ORAL | 5 refills | Status: DC

## 2016-02-01 MED ORDER — DICLOFENAC SODIUM 1 % TD GEL: 2.0000 g | Freq: Three times a day (TID) | TRANSDERMAL | 0 refills | Status: DC | PRN

## 2016-02-01 MED ORDER — POTASSIUM CHLORIDE CRYS ER 20 MEQ PO TBCR: 40.0000 meq | EXTENDED_RELEASE_TABLET | Freq: Two times a day (BID) | ORAL | 5 refills | Status: DC

## 2016-02-01 MED ORDER — SIMVASTATIN 20 MG PO TABS: 20.0000 mg | ORAL_TABLET | Freq: Every day | ORAL | 5 refills | Status: DC

## 2016-02-01 MED ORDER — FLUTICASONE PROPIONATE 50 MCG/ACT NA SUSP: 2.0000 | Freq: Every day | NASAL | 2 refills | Status: DC

## 2016-02-01 MED ORDER — FUROSEMIDE 40 MG PO TABS: 40.0000 mg | ORAL_TABLET | Freq: Every day | ORAL | 5 refills | Status: DC

## 2016-02-01 MED ORDER — METOPROLOL TARTRATE 25 MG PO TABS: 25.0000 mg | ORAL_TABLET | Freq: Two times a day (BID) | ORAL | 5 refills | Status: DC

## 2016-02-01 MED ORDER — LISINOPRIL 10 MG PO TABS: 10.0000 mg | ORAL_TABLET | Freq: Every day | ORAL | 5 refills | Status: DC

## 2016-02-01 MED ORDER — CETIRIZINE HCL 10 MG PO TABS: 10.0000 mg | ORAL_TABLET | Freq: Every day | ORAL | 5 refills | Status: DC

## 2016-02-01 NOTE — Assessment & Plan Note (Signed)
A: INR therapeutic today at 2.7 (2.5-3.5 for mechanical MV).  No chest pain or SOB.  Admitted back in January 2017 with PE when off coumadin.  P: - refilled coumadin at current dose. - will have her set up for future INR checks/coumadin monitoring in our clinic.

## 2016-02-01 NOTE — Assessment & Plan Note (Signed)
>>  ASSESSMENT AND PLAN FOR BIVENTRICULAR HEART FAILURE WITH REDUCED LEFT VENTRICULAR FUNCTION (HCC) WRITTEN ON 02/01/2016  9:01 AM BY WALLACE, ANDREW, DO  A: Needs refills on her medications of metoprolol, lisinopril, and Lasix.  Appears euvolemic today.  Lungs clear.  BP slightly elevated.  P: - continue current medications. - refilled metoprolol, lasix, and lisinopril.

## 2016-02-01 NOTE — Assessment & Plan Note (Signed)
A/P: - refilled simvastatin.

## 2016-02-01 NOTE — Assessment & Plan Note (Signed)
>>  ASSESSMENT AND PLAN FOR CHF (CONGESTIVE HEART FAILURE) (HCC) WRITTEN ON 09/14/2023 11:43 AM BY Juleen Sorrels, DO   >>ASSESSMENT AND PLAN FOR BIVENTRICULAR HEART FAILURE WITH REDUCED LEFT VENTRICULAR FUNCTION (HCC) WRITTEN ON 02/01/2016  9:01 AM BY WALLACE, ANDREW, DO  A: Needs refills on her medications of metoprolol, lisinopril, and Lasix.  Appears euvolemic today.  Lungs clear.  BP slightly elevated.  P: - continue current medications. - refilled metoprolol, lasix, and lisinopril.

## 2016-02-01 NOTE — Assessment & Plan Note (Signed)
A/P: Tetanus given today.

## 2016-02-01 NOTE — Assessment & Plan Note (Signed)
A: Patient having worsening of allergy symptoms with increased congestion and drainage.  Has been occurring for the past 2 months and has history of allergies.  Has not tried anything for relief.  P: - trial of Flonase and anti-histamine.

## 2016-02-01 NOTE — Assessment & Plan Note (Signed)
A/P: - refilled K-dur - BMET today looks good.

## 2016-02-01 NOTE — Assessment & Plan Note (Signed)
A: Recurrent back pain that she has previously told was secondary to arthritis.  No alarm symptoms or red flags.  Sounds MSK related with radiculopathy as she reports radiating symptoms down back of leg to mid-calf.  No bone pain, no weight loss, no night time awakenings.  Has been using Tylenol, voltaren gel, heat, and rest with mild relief.  P: - will obtain imaging with plain films of her lumbar spine given age and to better characterize any degenerative changes. - continue conservative management.  Could consider trial of anti-inflammatories with close monitoring given coumadin. - pending imaging, consider referral to Sports Medicine or PT.

## 2016-02-01 NOTE — Assessment & Plan Note (Signed)
>>  ASSESSMENT AND PLAN FOR CHRONIC HFREF (HEART FAILURE WITH REDUCED EJECTION FRACTION) (HCC) WRITTEN ON 09/14/2023 11:43 AM BY Shawon Denzer, DO   >>ASSESSMENT AND PLAN FOR CHF (CONGESTIVE HEART FAILURE) (HCC) WRITTEN ON 09/14/2023 11:43 AM BY Fenton Candee, DO   >>ASSESSMENT AND PLAN FOR BIVENTRICULAR HEART FAILURE WITH REDUCED LEFT VENTRICULAR FUNCTION (HCC) WRITTEN ON 02/01/2016  9:01 AM BY WALLACE, ANDREW, DO  A: Needs refills on her medications of metoprolol, lisinopril, and Lasix.  Appears euvolemic today.  Lungs clear.  BP slightly elevated.  P: - continue current medications. - refilled metoprolol, lasix, and lisinopril.

## 2016-02-01 NOTE — Assessment & Plan Note (Signed)
>>  ASSESSMENT AND PLAN FOR HFREF (HEART FAILURE WITH REDUCED EJECTION FRACTION) (HCC) WRITTEN ON 09/14/2023 11:44 AM BY Maveryk Renstrom, DO   >>ASSESSMENT AND PLAN FOR CHRONIC HFREF (HEART FAILURE WITH REDUCED EJECTION FRACTION) (HCC) WRITTEN ON 09/14/2023 11:43 AM BY Emmons Toth, DO   >>ASSESSMENT AND PLAN FOR CHF (CONGESTIVE HEART FAILURE) (HCC) WRITTEN ON 09/14/2023 11:43 AM BY Subrena Devereux, DO   >>ASSESSMENT AND PLAN FOR BIVENTRICULAR HEART FAILURE WITH REDUCED LEFT VENTRICULAR FUNCTION (HCC) WRITTEN ON 02/01/2016  9:01 AM BY WALLACE, ANDREW, DO  A: Needs refills on her medications of metoprolol, lisinopril, and Lasix.  Appears euvolemic today.  Lungs clear.  BP slightly elevated.  P: - continue current medications. - refilled metoprolol, lasix, and lisinopril.

## 2016-02-01 NOTE — Assessment & Plan Note (Signed)
A: Needs refills on her medications of metoprolol, lisinopril, and Lasix.  Appears euvolemic today.  Lungs clear.  BP slightly elevated.  P: - continue current medications. - refilled metoprolol, lasix, and lisinopril.

## 2016-02-03 NOTE — Progress Notes (Signed)
Medicine attending: Medical history, presenting problems, physical findings, and medications, reviewed with resident physician Dr Andrew Wallace on the day of the patient visit and I concur with his evaluation and management plan. 

## 2016-02-11 ENCOUNTER — Telehealth: Payer: Self-pay | Admitting: *Deleted

## 2016-02-11 NOTE — Telephone Encounter (Signed)
Fax from [facility] [employer] - Furosemide 40 mg is on back order; available are 20 mg and 80 mg. Called pt - prefers taking 2 20 mg tablets.  Please send new rx . Thanks

## 2016-02-12 ENCOUNTER — Other Ambulatory Visit: Payer: Self-pay | Admitting: Internal Medicine

## 2016-02-12 DIAGNOSIS — I5042 Chronic combined systolic (congestive) and diastolic (congestive) heart failure: Secondary | ICD-10-CM

## 2016-02-12 MED ORDER — FUROSEMIDE 20 MG PO TABS: 40.0000 mg | ORAL_TABLET | Freq: Every day | ORAL | 3 refills | Status: DC

## 2016-02-12 NOTE — Telephone Encounter (Signed)
New Rx sent to pharmacy.  Thanks.

## 2016-03-23 ENCOUNTER — Ambulatory Visit (HOSPITAL_COMMUNITY)
Admission: RE | Admit: 2016-03-23 | Discharge: 2016-03-23 | Disposition: A | Discharge status: Home / Self Care | Payer: Medicare Other | Source: Ambulatory Visit | Attending: Internal Medicine | Admitting: Internal Medicine

## 2016-03-23 ENCOUNTER — Ambulatory Visit (INDEPENDENT_AMBULATORY_CARE_PROVIDER_SITE_OTHER): Payer: Medicare Other | Admitting: Pharmacist

## 2016-03-23 ENCOUNTER — Ambulatory Visit: Payer: Medicare Other

## 2016-03-23 ENCOUNTER — Other Ambulatory Visit (INDEPENDENT_AMBULATORY_CARE_PROVIDER_SITE_OTHER): Payer: Medicare Other

## 2016-03-23 DIAGNOSIS — Z7289 Other problems related to lifestyle: Secondary | ICD-10-CM | POA: Diagnosis not present

## 2016-03-23 DIAGNOSIS — I2699 Other pulmonary embolism without acute cor pulmonale: Secondary | ICD-10-CM | POA: Diagnosis present

## 2016-03-23 DIAGNOSIS — Z7901 Long term (current) use of anticoagulants: Secondary | ICD-10-CM

## 2016-03-23 DIAGNOSIS — G8929 Other chronic pain: Secondary | ICD-10-CM | POA: Diagnosis not present

## 2016-03-23 DIAGNOSIS — I700 Atherosclerosis of aorta: Secondary | ICD-10-CM | POA: Diagnosis not present

## 2016-03-23 DIAGNOSIS — M545 Low back pain: Secondary | ICD-10-CM

## 2016-03-23 DIAGNOSIS — G545 Neuralgic amyotrophy: Secondary | ICD-10-CM | POA: Insufficient documentation

## 2016-03-23 DIAGNOSIS — M47816 Spondylosis without myelopathy or radiculopathy, lumbar region: Secondary | ICD-10-CM | POA: Diagnosis not present

## 2016-03-24 LAB — POCT INR: INR: 4.5

## 2016-03-24 LAB — HEPATITIS C ANTIBODY: HEP C VIRUS AB: 0.1 {s_co_ratio} (ref 0.0–0.9)

## 2016-03-24 NOTE — Progress Notes (Signed)
Anti-Coagulation Progress Note  Kim Anthony is a 66 y.o. female who is currently on an anti-coagulation regimen.    RECENT RESULTS: Recent results are below, the most recent result is correlated with a dose of 34 mg. per week: Lab Results  Component Value Date   INR 4.5 03/24/2016   INR 2.7 01/30/2016   INR 2.6 08/12/2015    ANTI-COAG DOSE: Anticoagulation Dose Instructions as of 03/23/2016      Dorene Grebe Tue Wed Thu Fri Sat   New Dose 6 mg 4 mg 4 mg 4 mg 4 mg 4 mg 4 mg       ANTICOAG SUMMARY: Anticoagulation Episode Summary    Current INR goal:   2.5-3.5  TTR:   86.6 % (7.4 mo)  Next INR check:   04/13/2016  INR from last check:   2.7 (01/30/2016)  Most recent INR:    4.5! (03/24/2016)  Weekly max dose:     Target end date:     [facility]   [facility]  Preferred lab:     Send INR reminders to:      Indications   PE (pulmonary embolism) [I26.99]       Comments:           ANTICOAG TODAY: Anticoagulation Summary  As of 03/23/2016   INR goal:   2.5-3.5  TTR:     Today's INR:   2.7 (01/30/2016)  Next INR check:   04/13/2016  Target end date:      Indications   PE (pulmonary embolism) [I26.99]        Anticoagulation Episode Summary    [facility] check [facility]   [facility]   Preferred lab:      Send INR reminders to:      Comments:         PATIENT INSTRUCTIONS: There are no Patient Instructions on file for this visit.   FOLLOW-UP Return in about 3 weeks (around 04/13/2016) for Follow up INR at 4:45PM.  Jorene Guest, III Pharm.D., CACP

## 2016-03-24 NOTE — Patient Instructions (Signed)
Patient instructed to take medications as defined in the Anti-coagulation Track section of this encounter.  Patient instructed to take today's dose.  Patient verbalized understanding of these instructions.    

## 2016-03-26 ENCOUNTER — Ambulatory Visit: Payer: Medicare Other

## 2016-04-10 ENCOUNTER — Telehealth: Payer: Self-pay

## 2016-04-10 NOTE — Telephone Encounter (Signed)
Requesting xray result. Please call pt back. 

## 2016-04-12 NOTE — Telephone Encounter (Signed)
6:22 PM 04/12/2016  Called patient and spoke directly to Kim Anthony.  Informed her of x-ray findings showing some degenerative changes in the lumbar spine.

## 2016-04-13 ENCOUNTER — Ambulatory Visit: Payer: Medicare Other

## 2016-04-14 NOTE — Telephone Encounter (Signed)
rtc to daughter, lm for rtc 

## 2016-04-14 NOTE — Telephone Encounter (Signed)
Pt daughter calling about results would like nurse to call

## 2016-04-15 ENCOUNTER — Telehealth: Payer: Self-pay

## 2016-04-15 NOTE — Telephone Encounter (Signed)
Pt daughter asking to speak with a nurse. Please call back.

## 2016-04-16 NOTE — Telephone Encounter (Signed)
Spoke w/ daughter, she would like to discuss osteoarthritis with doctor and pt, appt made in [place] for 11/3

## 2016-04-20 ENCOUNTER — Ambulatory Visit (INDEPENDENT_AMBULATORY_CARE_PROVIDER_SITE_OTHER): Payer: Medicare Other | Admitting: Pharmacist

## 2016-04-20 DIAGNOSIS — Z952 Presence of prosthetic heart valve: Secondary | ICD-10-CM

## 2016-04-20 DIAGNOSIS — Z7901 Long term (current) use of anticoagulants: Secondary | ICD-10-CM

## 2016-04-20 DIAGNOSIS — I2699 Other pulmonary embolism without acute cor pulmonale: Secondary | ICD-10-CM | POA: Diagnosis not present

## 2016-04-20 LAB — POCT INR: INR: 2

## 2016-04-20 NOTE — Patient Instructions (Signed)
Patient instructed to take medications as defined in the Anti-coagulation Track section of this encounter.  Patient instructed to take 1 tablet on Tuesdays, Wednesdays, Fridays and Saturdays; on Sundays, Mondays and Thursdays--take 1&1/2 tablets. Patient instructed to take today's dose.  Patient verbalized understanding of these instructions.

## 2016-04-20 NOTE — Progress Notes (Signed)
Anti-Coagulation Progress Note  Kim Anthony is a 65 y.o. female who is currently on an anti-coagulation regimen.    RECENT RESULTS: Recent results are below, the most recent result is correlated with a dose of 30 mg. per week: Lab Results  Component Value Date   INR 2.0 04/20/2016   INR 4.5 03/24/2016   INR 2.7 01/30/2016    ANTI-COAG DOSE: Anticoagulation Dose Instructions as of 04/20/2016      Dorene Grebe Tue Wed Thu Fri Sat   New Dose 6 mg 6 mg 4 mg 4 mg 6 mg 4 mg 4 mg       ANTICOAG SUMMARY: Anticoagulation Episode Summary    Current INR goal:   2.5-3.5  TTR:   81.5 % (8.3 mo)  Next INR check:   05/18/2016  INR from last check:   2.0! (04/20/2016)  Weekly max dose:     Target end date:     [facility]  Preferred lab:     Send INR reminders to:      Indications   PE (pulmonary embolism) [I26.99]       Comments:           ANTICOAG TODAY: Anticoagulation Summary  As of 04/20/2016   INR goal:   2.5-3.5  TTR:     Today's INR:   2.0!  Next INR check:   05/18/2016  Target end date:      Indications   PE (pulmonary embolism) [I26.99]        Anticoagulation Episode Summary    [facility] check location[facility]   [facility]   Preferred lab:      Send INR reminders to:      Comments:         PATIENT INSTRUCTIONS: Patient instructed to take medications as defined in the Anti-coagulation Track section of this encounter.  Patient instructed to take 1 tablet on Tuesdays, Wednesdays, Fridays and Saturdays; on Sundays, Mondays and Thursdays--take 1&1/2 tablets. Patient instructed to take today's dose.  Patient verbalized understanding of these instructions.      FOLLOW-UP Return in about 4 weeks (around 05/18/2016) for Follow up INR at 4:30PM.  Jorene Guest, III Pharm.D., CACP

## 2016-04-21 NOTE — Progress Notes (Signed)
INTERNAL MEDICINE TEACHING ATTENDING ADDENDUM - Lucious Groves, DO Duration- lifelong, Indication- MECHANICAL MVR (also had PE when off A/C but goal of 2.5-3.5 is , INR-  Lab Results  Component Value Date   INR 2.0 04/20/2016  . Agree with pharmacy recommendations as outlined in their note.

## 2016-04-24 ENCOUNTER — Ambulatory Visit (HOSPITAL_COMMUNITY)
Admission: RE | Admit: 2016-04-24 | Discharge: 2016-04-24 | Disposition: A | Discharge status: Home / Self Care | Payer: Medicare Other | Source: Ambulatory Visit | Attending: Internal Medicine | Admitting: Internal Medicine

## 2016-04-24 ENCOUNTER — Ambulatory Visit (INDEPENDENT_AMBULATORY_CARE_PROVIDER_SITE_OTHER): Payer: Medicare Other | Admitting: Internal Medicine

## 2016-04-24 DIAGNOSIS — G8929 Other chronic pain: Secondary | ICD-10-CM | POA: Diagnosis not present

## 2016-04-24 DIAGNOSIS — M1611 Unilateral primary osteoarthritis, right hip: Secondary | ICD-10-CM | POA: Diagnosis not present

## 2016-04-24 DIAGNOSIS — M545 Low back pain: Secondary | ICD-10-CM | POA: Diagnosis not present

## 2016-04-24 DIAGNOSIS — M62830 Muscle spasm of back: Secondary | ICD-10-CM

## 2016-04-24 DIAGNOSIS — Z5181 Encounter for therapeutic drug level monitoring: Secondary | ICD-10-CM | POA: Diagnosis not present

## 2016-04-24 DIAGNOSIS — M47816 Spondylosis without myelopathy or radiculopathy, lumbar region: Secondary | ICD-10-CM | POA: Insufficient documentation

## 2016-04-24 DIAGNOSIS — F17210 Nicotine dependence, cigarettes, uncomplicated: Secondary | ICD-10-CM | POA: Diagnosis not present

## 2016-04-24 MED ORDER — BACLOFEN 10 MG PO TABS: 10.0000 mg | ORAL_TABLET | Freq: Every day | ORAL | 0 refills | Status: DC

## 2016-04-24 NOTE — Progress Notes (Signed)
   CC: lower back/ hip pain     HPI: Ms.Amellia Bayona is a 65 y.o. with past medical history as outlined below who presents to clinic for follow up of lower back pain. She has had right lower back pain for the past 2 years. The pain comes and goes and is worsened with walking or standing for extended periods of time. The pain radiates down her leg to her calf at times. She is also describes bilateral groin pain.  She denies saddle anesthesia or incontinence. The pain has been minimally responsive to tylenol, voltaren gel, heat, and ibuprofen. She has not tried stretching exercises.   Please see problem list for status of the pt's chronic medical problems.  Past Medical History:  Diagnosis Date  . Breast cancer ([place])   . CHF (congestive heart failure) ([place])   . COPD (chronic obstructive pulmonary disease) ([place])   . Coronary artery disease   . Hypertension     Review of Systems:   Review of Systems  Musculoskeletal: Positive for back pain, joint pain and myalgias.  Neurological: Negative for tingling and sensory change.    Physical Exam:  There were no vitals filed for this visit. Physical Exam  Constitutional: No distress.  Antalgic gait   Cardiovascular: Normal rate and regular rhythm.   No murmur heard. Pulmonary/Chest: She has no wheezes. She has no rales.  Abdominal: Soft. She exhibits no distension. There is no tenderness.  Musculoskeletal: She exhibits tenderness. She exhibits no edema or deformity.  Right lower back and gluteal muscle spasm. Right Lateral hip tenderness to palpation. Negative straight leg raise.     Assessment & Plan:   See Encounters Tab for problem based charting.   Patient seen with Dr. Daryll Drown

## 2016-04-24 NOTE — Assessment & Plan Note (Signed)
She has had right lower back pain for the past 2 years. The pain comes and goes and is worsened with walking or standing for extended periods of time. The pain radiates down her leg to her calf at times. She is also describes bilateral groin pain.  She denies saddle anesthesia or incontinence. The pain has been minimally responsive to tylenol, voltaren gel, heat, and ibuprofen.  She has not tried stretching exercises. Lumbar spine xray showed degenerative changes. On exam she has right gluteal and paraspinal muscle spasm. This pain may be related to her muscles spasm or degenerative disease in her hip.   Xray right hip and pelvis  Ambulatory physical therapy referral  Scheduled tylenol extra strength, ordered liver panel to assess liver function  Capsaicin cream  Baclofen 10 mg # 30 at bedtime  Follow up with primary care provider in 3 months

## 2016-04-24 NOTE — Patient Instructions (Addendum)
It was a pleasure to meet you today [name]  I will call you with the results of your hip x-ray Try picking up Capsaicin cream, don't forget to wash this off of your hands after applying it.   Try taking baclofen at night. This is a muscle relaxant. Don't drive after you've taken it, it will make you tired.   We have referred you for physical therapy, please let us know if you don't hear from someone about this  Continue taking tyleonol extra strength every 6 hours, do not take more than 4000 mg of tylenol daily   Schedule an appointment to see your primary care doctor in 3 months  Call us if your hip pain worsens or you develop new symptoms

## 2016-04-25 LAB — HEPATIC FUNCTION PANEL
ALT: 20 IU/L (ref 0–32)
AST: 20 IU/L (ref 0–40)
Albumin: 4.2 g/dL (ref 3.6–4.8)
Alkaline Phosphatase: 107 IU/L (ref 39–117)
BILIRUBIN TOTAL: 0.5 mg/dL (ref 0.0–1.2)
BILIRUBIN, DIRECT: 0.14 mg/dL (ref 0.00–0.40)
Total Protein: 7.7 g/dL (ref 6.0–8.5)

## 2016-04-28 NOTE — Progress Notes (Signed)
Internal Medicine Clinic Attending  I saw and evaluated the patient.  I personally confirmed the key portions of the history and exam documented by Dr. Blum and I reviewed pertinent patient test results.  The assessment, diagnosis, and plan were formulated together and I agree with the documentation in the resident's note. 

## 2016-05-06 ENCOUNTER — Ambulatory Visit: Payer: Medicare Other | Attending: Internal Medicine | Admitting: Physical Therapy

## 2016-05-06 ENCOUNTER — Encounter: Payer: Self-pay | Admitting: Physical Therapy

## 2016-05-06 DIAGNOSIS — R293 Abnormal posture: Secondary | ICD-10-CM | POA: Diagnosis not present

## 2016-05-06 DIAGNOSIS — M545 Low back pain: Secondary | ICD-10-CM | POA: Insufficient documentation

## 2016-05-06 DIAGNOSIS — G8929 Other chronic pain: Secondary | ICD-10-CM | POA: Insufficient documentation

## 2016-05-06 DIAGNOSIS — M6281 Muscle weakness (generalized): Secondary | ICD-10-CM | POA: Insufficient documentation

## 2016-05-06 DIAGNOSIS — M62830 Muscle spasm of back: Secondary | ICD-10-CM | POA: Diagnosis not present

## 2016-05-06 NOTE — Therapy (Signed)
[place], [place], [address] Phone: [phone]   Fax:  [phone]  Physical Therapy Evaluation  Patient Details  Name: Kim Anthony [name]: [id number] Date of Birth: 1950/11/11 Referring Provider: Sid Falcon MD  Encounter Date: 05/06/2016      PT End of Session - 05/06/16 1750    Visit Number 1   Number of Visits 17   Date for PT Re-Evaluation 07/01/16   Authorization Type Medicare: Kx mod by 15th visit, progress note by 10th visit   PT Start Time 1633   PT Stop Time 1720   PT Time Calculation (min) 47 min   Activity Tolerance Patient tolerated treatment well   Behavior During Therapy [facility] for tasks assessed/performed      Past Medical History:  Diagnosis Date  . Breast cancer ([place])   . CHF (congestive heart failure) ([place])   . COPD (chronic obstructive pulmonary disease) ([place])   . Coronary artery disease   . Hypertension     Past Surgical History:  Procedure Laterality Date  . MASTECTOMY    . VALVE REPLACEMENT      There were no vitals filed for this visit.       Subjective Assessment - 05/06/16 1640    Subjective pt is a 65 y.o F with CC of R low back pain with referral in to the R anklethat started a couple of years ago with , with recent pin that goes in to the groing bil. increased difficulty with stress incontinenance that may be related du to th elow back and rpeorted feeling decreased sensation in bil saddle region.  pt denies weakness or balance problems. pain feels better with bending forward.    How long can you sit comfortably? 20 min   How long can you stand comfortably? 15 min   How long can you walk comfortably? 5-10 min   Diagnostic tests x-ray of the low back and hip   Patient Stated Goals help the pain inthe back, walk longer, standing longer,    Currently in Pain? Yes   Pain Score 7    Pain Location Back   Pain Orientation Right   Pain Descriptors / Indicators  Sharp;Aching   Pain Type Chronic pain   Pain Radiating Towards to bil groin and R ankle   Pain Onset More than a month ago   Pain Frequency Constant   Aggravating Factors  prolong stadning/ walking/ sitting   Pain Relieving Factors leaning            [facility] PT Assessment - 05/06/16 1652      Assessment   Medical Diagnosis low back pain    Referring Provider Sid Falcon MD   Onset Date/Surgical Date --  couple of years   Hand Dominance Right   Next MD Visit make on PRN   Prior Therapy no     Precautions   Precautions None     Restrictions   Weight Bearing Restrictions No     Balance Screen   Has the patient fallen in the past 6 months No   Has the patient had a decrease in activity level because of a fear of falling?  No   Is the patient reluctant to leave their home because of a fear of falling?  No     Home Social worker Private residence   Living Arrangements Children   Available Help at Discharge Family   Type of [place]  Home Access Stairs to enter   Entrance Stairs-Number of Steps 4   Entrance Stairs-Rails Can reach both   Home Layout One level     Cognition   Overall Cognitive Status Within Functional Limits for tasks assessed     Observation/Other Assessments   Focus on Therapeutic Outcomes (FOTO)  54% limited  predicted 42% limited     Posture/Postural Control   Posture/Postural Control Postural limitations   Postural Limitations Forward head;Rounded Shoulders;Increased lumbar lordosis     ROM / Strength   AROM / PROM / Strength AROM;Strength     AROM   AROM Assessment Site Lumbar   Lumbar Flexion 50  ERP   Lumbar Extension 8  pain during movement   Lumbar - Right Side Bend 20  pain during testing   Lumbar - Left Side Bend 8     Strength   Strength Assessment Site Hip;Knee   Right/Left Hip Right;Left   Right Hip Flexion 3+/5   Right Hip Extension 3/5   Right Hip ABduction 3/5   Right Hip ADduction 3+/5   Left  Hip Flexion 3+/5   Left Hip Extension 3/5   Left Hip ABduction 3/5   Left Hip ADduction 3/5  pain in groin during testing   Right/Left Knee Right;Left   Right Knee Flexion 4/5   Right Knee Extension 4+/5   Left Knee Flexion 4/5   Left Knee Extension 4+/5     Palpation   Spinal mobility hypomobility with PAIVM at L1-L5 with pain during assessment  assessed in L sidelying due to pt not tolerated prone   Palpation comment tenderness with tightness in bil parapsinals into bil glute region, significant pain and tightness at R piriformis (pt stated this is where it started). bil groin pain in the femoral triangle at the acetabulofemoral joints bil with bil adductor spasm     Special Tests    Special Tests Lumbar;Hip Special Tests   Lumbar Tests Straight Leg Raise;Prone Knee Bend Test;Slump Test   Hip Special Tests  Hip Scouring;Other     Slump test   Findings Positive   Side Right   Comment --     Prone Knee Bend Test   Findings Unable to test   Comment uncomfortable position     Straight Leg Raise   Findings Positive   Side  Right   Comment contralteral pain during L SLR     Hip Scouring   Findings Positive   Side --  bil   Comments bil     other   Findings Positive   Side --  bil hip distraction   Comments relief of pain on the R and soreness on the L      Bed Mobility   Bed Mobility --     Ambulation/Gait   Ambulation/Gait Yes   Gait Pattern Step-through pattern;Decreased stride length;Trendelenburg;Antalgic;Trunk flexed;Decreased trunk rotation                           PT Education - 05/06/16 1749    Education provided Yes   Education Details evaluation findings, POC, goals, HEP with proper form/ ratioanle, anatomy of the hip joint and low back/ posterior hip muscualture.    Person(s) Educated Patient;Child(ren)   Methods Explanation;Verbal cues;Handout   Comprehension Verbalized understanding;Verbal cues required          PT Short  Term Goals - 05/06/16 1758      PT SHORT TERM GOAL #1  Title pt will be I with inital HEP (06/05/2016)   Time 4   Period Weeks   Status New     PT SHORT TERM GOAL #2   Title she will verbalize and demo proper posture and lift/carrying techniques to prevent/ reduce low back and hip pain (06/05/2016)   Time 4   Period Weeks   Status New     PT SHORT TERM GOAL #3   Title she will increase bil hip extension/ abductor strength to >/= 3+/5 for exercise progress and promote endurance (06/05/2016)   Time 4   Period Weeks   Status New     PT SHORT TERM GOAL #4   Title demonstrate reduce muscle tightness in the low back and R hip to promote trunk mobility with </= 5/10 pain (06/05/2016)   Time 4   Period Weeks   Status New           PT Long Term Goals - 05/06/16 1802      PT LONG TERM GOAL #1   Title she will be I with all HEP given as of last visit (07/01/2016)   Time 8   Period Weeks   Status New     PT LONG TERM GOAL #2   Title she will increase R hip strength to >/= 4/5 with </= 2/10 pain for prolonged walking/ standing activities (07/01/2016)   Time 8   Period Weeks   Status New     PT LONG TERM GOAL #3   Title she will be able to walk/ standing/ sit for >/= 30 mintues with </= 2/10 pain for function endurance required for ADLs and pt personal goals (07/01/2016)   Time 8   Period Weeks   Status New     PT LONG TERM GOAL #4   Title she will increase trunk flexion/extension and L side bending by >/= 8 degrees for functional mobility for ADLs with no report of RLE referred pain in the last week (07/01/2016)   Time 8   Period Weeks   Status New     PT LONG TERM GOAL #5   Title she will increase her FOTO score to </= 42% limitation to demonstrate improvement in function at discharge (07/01/2016)   Time 8   Period Weeks   Status New               Plan - 05/06/16 1750    Clinical Impression Statement Mrs. Picciano presents to OPPT as a moderate complexity  evaluatin based on PMHx, fluctuaing symptomology with recent exacerbation of groin pain and evaluatin findings of CC of low back with RLE referral. she exhibits limited AROM in all planes with extension bias with no report of increased referral down the RLE during assessment. significant weakness noted in bil LE with significant pain during adductor testing with R>L. special testing was postive for contralateral SLR and ipsilateral SLR with pain only in the R. positive OA findings for bil hips. tightness in R pirifromis and surrounding musculature as well as R lumbar paraspinas with hypomoblity of lumabr spine. She would benefit from physical therapy to decrease low back pain, improve trunk mobility, increase standing/ walking endurance and maximize her function by addressing the deficits listed.    Rehab Potential Good   PT Frequency 2x / week   PT Duration 8 weeks   PT Treatment/Interventions ADLs/Self Care Home Management;Cryotherapy;Electrical Stimulation;Iontophoresis 4mg /ml Dexamethasone;Moist Heat;Traction;Ultrasound;Therapeutic activities;Therapeutic exercise;Patient/family education;Dry needling;Balance training;Neuromuscular re-education;Passive range of motion   PT Next Visit Plan  Assess/ review HEP, stretching of low back/ piriformis, core strengthening, manual (pt not comfortable in prone may need pillows), gentle hip long axis distraction? hip strengthening   PT Home Exercise Plan pelvic tilt, piriformis stretch, supine clams, loewr trunk rotation    Consulted and Agree with Plan of Care Patient;Family member/caregiver   Family Member Consulted daughter      Patient will benefit from skilled therapeutic intervention in order to improve the following deficits and impairments:  Abnormal gait, Decreased activity tolerance, Decreased endurance, Pain, Hypomobility, Decreased range of motion, Increased fascial restricitons, Increased muscle spasms, Postural dysfunction, Improper body mechanics,  Decreased strength, Decreased balance  Visit Diagnosis: Chronic right-sided low back pain, with sciatica presence unspecified - Plan: PT plan of care cert/re-cert  Muscle spasm of back - Plan: PT plan of care cert/re-cert  Abnormal posture - Plan: PT plan of care cert/re-cert  Muscle weakness (generalized) - Plan: PT plan of care cert/re-cert      G-Codes - [id number]    Functional Assessment Tool Used FOTO/ clincal judgement   Functional Limitation Changing and maintaining body position   Changing and Maintaining Body Position Current Status [id number]) At least 40 percent but less than 60 percent impaired, limited or restricted   Changing and Maintaining Body Position Goal Status [id number]) At least 20 percent but less than 40 percent impaired, limited or restricted       Problem List Patient Active Problem List   Diagnosis Date Noted  . Essential hypertension 02/01/2016  . Allergic rhinitis 02/01/2016  . Healthcare maintenance 01/30/2016  . Chronic low back pain 07/22/2015  . PE (pulmonary embolism) 07/11/2015  . Chronic combined systolic and diastolic congestive heart failure ([place]) 07/11/2015  . Headache, common migraine 07/11/2015  . HLD (hyperlipidemia) 07/11/2015  . Hypokalemia 07/11/2015   [place] PT, DPT, LAT, ATC  05/06/16  6:12 PM      [facility] [address] [place], [place], [address] Phone: [phone]   Fax:  [phone]  Name: Adrielle Floresca [name]: [id number] Date of Birth: 1950-08-15

## 2016-05-08 ENCOUNTER — Other Ambulatory Visit: Payer: Self-pay | Admitting: Internal Medicine

## 2016-05-13 ENCOUNTER — Ambulatory Visit: Payer: Medicare Other | Admitting: Physical Therapy

## 2016-05-19 ENCOUNTER — Ambulatory Visit: Payer: Medicare Other | Admitting: Physical Therapy

## 2016-05-19 DIAGNOSIS — G8929 Other chronic pain: Secondary | ICD-10-CM

## 2016-05-19 DIAGNOSIS — M6281 Muscle weakness (generalized): Secondary | ICD-10-CM | POA: Diagnosis not present

## 2016-05-19 DIAGNOSIS — M62830 Muscle spasm of back: Secondary | ICD-10-CM

## 2016-05-19 DIAGNOSIS — R293 Abnormal posture: Secondary | ICD-10-CM

## 2016-05-19 DIAGNOSIS — M545 Low back pain: Principal | ICD-10-CM

## 2016-05-19 NOTE — Therapy (Signed)
[place], [place], [address] Phone: [phone]   Fax:  [phone]  Physical Therapy Treatment  Patient Details  Name: Kim Anthony [name]: [id number] Date of Birth: 01-23-1951 Referring Provider: Sid Falcon MD  Encounter Date: 05/19/2016      PT End of Session - 05/19/16 1740    Visit Number 217   PT Start Time [id number]   PT Stop Time 1632   PT Time Calculation (min) 44 min   Behavior During Therapy [facility] for tasks assessed/performed      Past Medical History:  Diagnosis Date  . Breast cancer ([place])   . CHF (congestive heart failure) ([place])   . COPD (chronic obstructive pulmonary disease) ([place])   . Coronary artery disease   . Hypertension     Past Surgical History:  Procedure Laterality Date  . MASTECTOMY    . VALVE REPLACEMENT      There were no vitals filed for this visit.      Subjective Assessment - 05/19/16 1551    Subjective pain is the same, has not started her exercises yet, she has been out of town.   7/10 now.  has whole right leg numbness after standing longer.  It  lasted 10 minutes with moving her foot up and dowm   Currently in Pain? Yes   Pain Location Back   Pain Orientation Right   Pain Descriptors / Indicators Sharp;Aching;Numbness   Pain Type Chronic pain   Pain Radiating Towards rt> left groin   Aggravating Factors  prolonged standing   Pain Relieving Factors sitting                         [facility] Adult PT Treatment/Exercise - 05/19/16 0001      Lumbar Exercises: Stretches   Standing Side Bend 3 reps;10 seconds   Standing Side Bend Limitations 1 side for quadratus stretch, also sidelying over a pillow, HEP   Piriformis Stretch --  3 reps each, supine not tolerated well, sitting better     Lumbar Exercises: Seated   Other Seated Lumbar Exercises on sit fit neutral spine, decreased pain,.  other exercises included toe taps, smaller range LAQ, and  marching, intermittant cues for technique.      Lumbar Exercises: Supine   Ab Set 5 reps   AB Set Limitations painful in hooklying     Lumbar Exercises: Sidelying   Clam Limitations painful did not tolerate     Knee/Hip Exercises: Stretches   Passive Hamstring Stretch 3 reps;30 seconds  heavy cues   Passive Hamstring Stretch Limitations strap sitting     Manual Therapy   Manual therapy comments brief soft tissue work right hip 3 minutes                PT Education - 05/19/16 1739    Education provided Yes   Education Details HEP   Person(s) Educated Child(ren)   Methods Explanation;Demonstration;Verbal cues;Handout;Tactile cues   Comprehension Verbalized understanding;Returned demonstration;Need further instruction          PT Short Term Goals - 05/19/16 1742      PT SHORT TERM GOAL #1   Title pt will be I with inital HEP (06/05/2016)   Baseline heavy cues   Time 4   Status On-going     PT SHORT TERM GOAL #2   Title she will verbalize and demo proper posture and lift/carrying techniques to prevent/ reduce low back  and hip pain (06/05/2016)   Time 4   Period Weeks   Status Unable to assess     PT SHORT TERM GOAL #3   Title she will increase bil hip extension/ abductor strength to >/= 3+/5 for exercise progress and promote endurance (06/05/2016)   Time 4   Period Weeks     PT SHORT TERM GOAL #4   Title demonstrate reduce muscle tightness in the low back and R hip to promote trunk mobility with </= 5/10 pain (06/05/2016)   Baseline muscles tight   Time 4   Period Weeks   Status On-going           PT Long Term Goals - 05/06/16 1802      PT LONG TERM GOAL #1   Title she will be I with all HEP given as of last visit (07/01/2016)   Time 8   Period Weeks   Status New     PT LONG TERM GOAL #2   Title she will increase R hip strength to >/= 4/5 with </= 2/10 pain for prolonged walking/ standing activities (07/01/2016)   Time 8   Period Weeks    Status New     PT LONG TERM GOAL #3   Title she will be able to walk/ standing/ sit for >/= 30 mintues with </= 2/10 pain for function endurance required for ADLs and pt personal goals (07/01/2016)   Time 8   Period Weeks   Status New     PT LONG TERM GOAL #4   Title she will increase trunk flexion/extension and L side bending by >/= 8 degrees for functional mobility for ADLs with no report of RLE referred pain in the last week (07/01/2016)   Time 8   Period Weeks   Status New     PT LONG TERM GOAL #5   Title she will increase her FOTO score to </= 42% limitation to demonstrate improvement in function at discharge (07/01/2016)   Time 8   Period Weeks   Status New               Plan - 05/19/16 1740    Clinical Impression Statement Extra time required for instruction for exercises.  Pain 7/10 at end of session,  Care was taken to aviod increasing her pain however her groin pain had increased.  Daughter was helpful during session.    PT Next Visit Plan  review HEP, stretching of low back/ piriformis, core strengthening, manual (pt not comfortable in prone may need pillows), gentle hip long axis distraction? hip strengthening   Consulted and Agree with Plan of Care Family member/caregiver;Patient   Family Member Consulted daughter      Patient will benefit from skilled therapeutic intervention in order to improve the following deficits and impairments:  Abnormal gait, Decreased activity tolerance, Decreased endurance, Pain, Hypomobility, Decreased range of motion, Increased fascial restricitons, Increased muscle spasms, Postural dysfunction, Improper body mechanics, Decreased strength, Decreased balance  Visit Diagnosis: Chronic right-sided low back pain, with sciatica presence unspecified  Muscle spasm of back  Abnormal posture  Muscle weakness (generalized)     Problem List Patient Active Problem List   Diagnosis Date Noted  . Essential hypertension 02/01/2016  .  Allergic rhinitis 02/01/2016  . Healthcare maintenance 01/30/2016  . Chronic low back pain 07/22/2015  . PE (pulmonary embolism) 07/11/2015  . Chronic combined systolic and diastolic congestive heart failure ([place]) 07/11/2015  . Headache, common migraine 07/11/2015  . HLD (hyperlipidemia)  07/11/2015  . Hypokalemia 07/11/2015    HARRIS,KAREN PTA 05/19/2016, 5:46 PM  [facility] [address] [place], [place], [address] Phone: [phone]   Fax:  [phone]  Name: Kim Anthony [name]: [id number] Date of Birth: 1951/02/20

## 2016-05-19 NOTE — Patient Instructions (Signed)
From exercise drawer: QL stretches and info.  To be done PRN 3-5 X each 10-20 seconds each.

## 2016-05-21 ENCOUNTER — Ambulatory Visit: Payer: Medicare Other | Admitting: Physical Therapy

## 2016-05-21 DIAGNOSIS — M545 Low back pain: Principal | ICD-10-CM

## 2016-05-21 DIAGNOSIS — G8929 Other chronic pain: Secondary | ICD-10-CM | POA: Diagnosis not present

## 2016-05-21 DIAGNOSIS — R293 Abnormal posture: Secondary | ICD-10-CM | POA: Diagnosis not present

## 2016-05-21 DIAGNOSIS — M6281 Muscle weakness (generalized): Secondary | ICD-10-CM

## 2016-05-21 DIAGNOSIS — M62830 Muscle spasm of back: Secondary | ICD-10-CM

## 2016-05-21 NOTE — Therapy (Signed)
[place], [place], [address] Phone: [phone]   Fax:  [phone]  Physical Therapy Treatment  Patient Details  Name: Kim Anthony [name]: [id number] Date of Birth: [birth year] Referring Provider: Sid Falcon MD  Encounter Date: 05/21/2016      PT End of Session - 05/21/16 1625    Visit Number 3   Number of Visits 17   Date for PT Re-Evaluation 07/01/16   PT Start Time 1500   PT Stop Time 1555   PT Time Calculation (min) 55 min   Activity Tolerance Patient tolerated treatment well      Past Medical History:  Diagnosis Date  . Breast cancer ([place])   . CHF (congestive heart failure) ([place])   . COPD (chronic obstructive pulmonary disease) ([place])   . Coronary artery disease   . Hypertension     Past Surgical History:  Procedure Laterality Date  . MASTECTOMY    . VALVE REPLACEMENT      There were no vitals filed for this visit.      Subjective Assessment - 05/21/16 1506    Subjective Pain up to 8/10 after she got home last session.  I did not try to stretch.  It Is on my right side and into my groin.  It is 8/10 today in the same places.   Currently in Pain? Yes   Pain Score 8    Pain Location Back   Pain Orientation Right   Pain Radiating Towards right groin                         [facility] Adult PT Treatment/Exercise - 05/21/16 0001      Self-Care   Self-Care Heat/Ice Application   Heat/Ice Application verbal     Lumbar Exercises: Stretches   Pelvic Tilt 5 reps   Pelvic Tilt Limitations heavy cues   Standing Side Bend --  1 rep 15 seconds   Standing Side Bend Limitations Mckenzie lateral technique 2 X 3 sets of 15     Lumbar Exercises: Supine   Glut Set 5 reps;5 seconds  monitored for pain   Clam --  1 rep 5 seconds, painful abd.  5 X 5 iso adduction    Clam Limitations Isometric, abd not tolerated.   Other Supine Lumbar Exercises Isometric knee flexion in 90/90.   1 X 10 seconds, stopped due to pain.   Other Supine Lumbar Exercises 3 pillows decompression leg press and leg lengthener 1-5 x each .  Groin pain decreased  with pillows.     Moist Heat Therapy   Number Minutes Moist Heat 15 Minutes   Moist Heat Location Lumbar Spine     Electrical Stimulation   Electrical Stimulation Location Right gluteal, groin,hip   Electrical Stimulation Action IFC   Electrical Stimulation Parameters 9   Electrical Stimulation Goals Pain                PT Education - 05/21/16 1624    Education provided Yes   Education Details HEP, use of heat /ice   Person(s) Educated Patient;Child(ren)   Methods Explanation;Demonstration;Tactile cues;Verbal cues;Handout   Comprehension Verbalized understanding;Returned demonstration          PT Short Term Goals - 05/19/16 1742      PT SHORT TERM GOAL #1   Title pt will be I with inital HEP (06/05/2016)   Baseline heavy cues   Time 4   Status  [place], [place], [address] Phone: [phone]   Fax:  [phone]  Physical Therapy Treatment  Patient Details  Name: Kim Anthony [name]: [id number] Date of Birth: [birth year] Referring Provider: Sid Falcon MD  Encounter Date: 05/21/2016      PT End of Session - 05/21/16 1625    Visit Number 3   Number of Visits 17   Date for PT Re-Evaluation 07/01/16   PT Start Time 1500   PT Stop Time 1555   PT Time Calculation (min) 55 min   Activity Tolerance Patient tolerated treatment well      Past Medical History:  Diagnosis Date  . Breast cancer ([place])   . CHF (congestive heart failure) ([place])   . COPD (chronic obstructive pulmonary disease) ([place])   . Coronary artery disease   . Hypertension     Past Surgical History:  Procedure Laterality Date  . MASTECTOMY    . VALVE REPLACEMENT      There were no vitals filed for this visit.      Subjective Assessment - 05/21/16 1506    Subjective Pain up to 8/10 after she got home last session.  I did not try to stretch.  It Is on my right side and into my groin.  It is 8/10 today in the same places.   Currently in Pain? Yes   Pain Score 8    Pain Location Back   Pain Orientation Right   Pain Radiating Towards right groin                         [facility] Adult PT Treatment/Exercise - 05/21/16 0001      Self-Care   Self-Care Heat/Ice Application   Heat/Ice Application verbal     Lumbar Exercises: Stretches   Pelvic Tilt 5 reps   Pelvic Tilt Limitations heavy cues   Standing Side Bend --  1 rep 15 seconds   Standing Side Bend Limitations Mckenzie lateral technique 2 X 3 sets of 15     Lumbar Exercises: Supine   Glut Set 5 reps;5 seconds  monitored for pain   Clam --  1 rep 5 seconds, painful abd.  5 X 5 iso adduction    Clam Limitations Isometric, abd not tolerated.   Other Supine Lumbar Exercises Isometric knee flexion in 90/90.   1 X 10 seconds, stopped due to pain.   Other Supine Lumbar Exercises 3 pillows decompression leg press and leg lengthener 1-5 x each .  Groin pain decreased  with pillows.     Moist Heat Therapy   Number Minutes Moist Heat 15 Minutes   Moist Heat Location Lumbar Spine     Electrical Stimulation   Electrical Stimulation Location Right gluteal, groin,hip   Electrical Stimulation Action IFC   Electrical Stimulation Parameters 9   Electrical Stimulation Goals Pain                PT Education - 05/21/16 1624    Education provided Yes   Education Details HEP, use of heat /ice   Person(s) Educated Patient;Child(ren)   Methods Explanation;Demonstration;Tactile cues;Verbal cues;Handout   Comprehension Verbalized understanding;Returned demonstration          PT Short Term Goals - 05/19/16 1742      PT SHORT TERM GOAL #1   Title pt will be I with inital HEP (06/05/2016)   Baseline heavy cues   Time 4   Status  systolic and diastolic congestive heart failure ([place]) 07/11/2015  . Headache, common migraine 07/11/2015  . HLD (hyperlipidemia) 07/11/2015  . Hypokalemia 07/11/2015    Tameisha Covell PTA 05/21/2016, 4:30 PM  [place], [place], [address] Phone: [phone]   Fax:  [phone]  Name: Kim Anthony [name]: [id number] Date of Birth: 07-12-1950

## 2016-05-21 NOTE — Patient Instructions (Signed)
Mckenzie lateral technique standing ,  Right side to wall.  Issued from  Exercise drawer.   PRN 1-6 X 10 seconds to improve posture.

## 2016-05-26 ENCOUNTER — Encounter: Payer: Medicare Other | Admitting: Physical Therapy

## 2016-05-27 ENCOUNTER — Ambulatory Visit: Payer: Medicare Other | Attending: Internal Medicine | Admitting: Physical Therapy

## 2016-05-27 DIAGNOSIS — G8929 Other chronic pain: Secondary | ICD-10-CM | POA: Insufficient documentation

## 2016-05-27 DIAGNOSIS — M6281 Muscle weakness (generalized): Secondary | ICD-10-CM | POA: Insufficient documentation

## 2016-05-27 DIAGNOSIS — M545 Low back pain: Secondary | ICD-10-CM | POA: Insufficient documentation

## 2016-05-27 DIAGNOSIS — M62830 Muscle spasm of back: Secondary | ICD-10-CM | POA: Insufficient documentation

## 2016-05-27 DIAGNOSIS — R293 Abnormal posture: Secondary | ICD-10-CM | POA: Insufficient documentation

## 2016-05-28 ENCOUNTER — Ambulatory Visit: Payer: Medicare Other | Admitting: Physical Therapy

## 2016-05-28 DIAGNOSIS — M6281 Muscle weakness (generalized): Secondary | ICD-10-CM

## 2016-05-28 DIAGNOSIS — G8929 Other chronic pain: Secondary | ICD-10-CM | POA: Diagnosis not present

## 2016-05-28 DIAGNOSIS — R293 Abnormal posture: Secondary | ICD-10-CM

## 2016-05-28 DIAGNOSIS — M545 Low back pain: Secondary | ICD-10-CM | POA: Diagnosis not present

## 2016-05-28 DIAGNOSIS — M62830 Muscle spasm of back: Secondary | ICD-10-CM | POA: Diagnosis not present

## 2016-05-28 NOTE — Patient Instructions (Signed)
Walk 3 x a day at least 1 minute.  Progress time walking, Log your progress

## 2016-05-28 NOTE — Therapy (Signed)
Kim Anthony, [place], [address] Phone: [phone]   Fax:  [phone]  Physical Therapy Treatment  Patient Details  Name: Kim Anthony [name]: [id number] Date of Birth: 1951/02/05 Referring Provider: Sid Falcon MD  Encounter Date: 05/28/2016      PT End of Session - 05/28/16 1740    Visit Number 4   Number of Visits 17   Date for PT Re-Evaluation 07/01/16   PT Start Time [id number]   PT Stop Time 1730   PT Time Calculation (min) 59 min   Activity Tolerance Patient tolerated treatment well   Behavior During Therapy [facility] for tasks assessed/performed      Past Medical History:  Diagnosis Date  . Breast cancer ([place])   . CHF (congestive heart failure) ([place])   . COPD (chronic obstructive pulmonary disease) ([place])   . Coronary artery disease   . Hypertension     Past Surgical History:  Procedure Laterality Date  . MASTECTOMY    . VALVE REPLACEMENT      There were no vitals filed for this visit.      Subjective Assessment - 05/28/16 1641    Subjective Pain 5/10.  Exercises make me sore.  The machines and heat help.  5/10 now.  No groin painfor a couple of days.   Currently in Pain? Yes   Pain Score 5    Pain Location Back   Pain Orientation Right;Posterior   Pain Descriptors / Indicators Tightness   Aggravating Factors  standing longer   Pain Relieving Factors sitting                         [facility] Adult PT Treatment/Exercise - 05/28/16 0001      Lumbar Exercises: Stretches   Pelvic Tilt 5 reps   Pelvic Tilt Limitations sitting   Standing Side Bend Limitations sitting side bend to left 3 x 10     Lumbar Exercises: Standing   Other Standing Lumbar Exercises Standing "TALL"  SLR flexion, hip/knee flexion, abduction  5 x 2 sets and/or 10 X each   fatigues   Other Standing Lumbar Exercises gait: cues HEP she is o walk 1 minute 3 x a day and log compliance./ activity     Lumbar  Exercises: Seated   Other Seated Lumbar Exercises clam, red band 5 x 2 sets, cues posture,  sitting rows 10 X1, 5 x 1,   Other Seated Lumbar Exercises Sitting on edge: large ball abdominal  both and single hand presses 5 X 2 each  sitting smal ball touch knee/then lift diagonally, to left      Moist Heat Therapy   Number Minutes Moist Heat 15 Minutes   Moist Heat Location Lumbar Spine;Other (comment)  groin right.     Acupuncturist Location Right gluteal, groin,hip   Electrical Stimulation Action IFC   Electrical Stimulation Parameters 9   Electrical Stimulation Goals Pain  with moist heat sitting                PT Education - 05/28/16 1729    Education provided Yes   Education Details HEP   home walking   Person(s) Educated Patient;Child(ren)   Methods Explanation;Demonstration   Comprehension Verbalized understanding;Returned demonstration          PT Short Term Goals - 05/28/16 1745      PT SHORT TERM GOAL #1   Title pt will  be I with inital HEP (06/05/2016)   Baseline not doing "I'm Lazy"   Time 4   Period Weeks   Status On-going     PT SHORT TERM GOAL #2   Title she will verbalize and demo proper posture and lift/carrying techniques to prevent/ reduce low back and hip pain (06/05/2016)   Time 4   Period Weeks   Status Unable to assess     PT SHORT TERM GOAL #3   Title she will increase bil hip extension/ abductor strength to >/= 3+/5 for exercise progress and promote endurance (06/05/2016)   Baseline beginning work on these   Time 4   Period Weeks   Status On-going     PT SHORT TERM GOAL #4   Title demonstrate reduce muscle tightness in the low back and R hip to promote trunk mobility with </= 5/10 pain (06/05/2016)   Baseline Muscles tight , pain not consistantly 5/10   Time 4   Period Weeks   Status On-going           PT Long Term Goals - 05/06/16 1802      PT LONG TERM GOAL #1   Title she will be I  with all HEP given as of last visit (07/01/2016)   Time 8   Period Weeks   Status New     PT LONG TERM GOAL #2   Title she will increase R hip strength to >/= 4/5 with </= 2/10 pain for prolonged walking/ standing activities (07/01/2016)   Time 8   Period Weeks   Status New     PT LONG TERM GOAL #3   Title she will be able to walk/ standing/ sit for >/= 30 mintues with </= 2/10 pain for function endurance required for ADLs and pt personal goals (07/01/2016)   Time 8   Period Weeks   Status New     PT LONG TERM GOAL #4   Title she will increase trunk flexion/extension and L side bending by >/= 8 degrees for functional mobility for ADLs with no report of RLE referred pain in the last week (07/01/2016)   Time 8   Period Weeks   Status New     PT LONG TERM GOAL #5   Title she will increase her FOTO score to </= 42% limitation to demonstrate improvement in function at discharge (07/01/2016)   Time 8   Period Weeks   Status New               Plan - 05/28/16 1741    Clinical Impression Statement Patient's pain considerably improved.  Pain flared from 5 to 6/10 during session.  Pain decreased to minimal pain after modalities.  Kim Anthony confesses to not even looking at her home exercises.   She is going to look online for a Home TENS unit.    PT Next Visit Plan Nustep. Standing and sitting exercises tolerated today with a "Core" focus. IFC/Heat.   PT Home Exercise Plan pelvic tilt, piriformis stretch, supine clams, lower trunk rotation 11/30.  [place] lateral technique.  Walking at least 1 minute a day.   Consulted and Agree with Plan of Care Patient;Family member/caregiver   Family Member Consulted daughter      Patient will benefit from skilled therapeutic intervention in order to improve the following deficits and impairments:  Abnormal gait, Decreased activity tolerance, Decreased endurance, Pain, Hypomobility, Decreased range of motion, Increased fascial restricitons,  Increased muscle spasms, Postural dysfunction, Improper body  mechanics, Decreased strength, Decreased balance  Visit Diagnosis: Chronic right-sided low back pain, with sciatica presence unspecified  Muscle spasm of back  Abnormal posture  Muscle weakness (generalized)     Problem List Patient Active Problem List   Diagnosis Date Noted  . Essential hypertension 02/01/2016  . Allergic rhinitis 02/01/2016  . Healthcare maintenance 01/30/2016  . Chronic low back pain 07/22/2015  . PE (pulmonary embolism) 07/11/2015  . Chronic combined systolic and diastolic congestive heart failure ([place]) 07/11/2015  . Headache, common migraine 07/11/2015  . HLD (hyperlipidemia) 07/11/2015  . Hypokalemia 07/11/2015    HARRIS,KAREN PTA 05/28/2016, 5:48 PM  [facility] [address] [place], [place], [address] Phone: [phone]   Fax:  [phone]  Name: Kim Anthony [name]: [id number] Date of Birth: 25-Jan-1951

## 2016-06-01 ENCOUNTER — Ambulatory Visit: Payer: Medicare Other | Admitting: Physical Therapy

## 2016-06-01 DIAGNOSIS — M545 Low back pain: Principal | ICD-10-CM

## 2016-06-01 DIAGNOSIS — G8929 Other chronic pain: Secondary | ICD-10-CM | POA: Diagnosis not present

## 2016-06-01 DIAGNOSIS — M62830 Muscle spasm of back: Secondary | ICD-10-CM | POA: Diagnosis not present

## 2016-06-01 DIAGNOSIS — R293 Abnormal posture: Secondary | ICD-10-CM

## 2016-06-01 DIAGNOSIS — M6281 Muscle weakness (generalized): Secondary | ICD-10-CM

## 2016-06-01 NOTE — Therapy (Signed)
[place], [place], [address] Phone: [phone]   Fax:  [phone]  Physical Therapy Treatment  Patient Details  Name: Kim Anthony [name]: [id number] Date of Birth: May 25, 1951 Referring Provider: Sid Falcon MD  Encounter Date: 06/01/2016      PT End of Session - 06/01/16 1730    Visit Number 5   Number of Visits 17   Date for PT Re-Evaluation 07/01/16   Authorization Type Medicare: Kx mod by 15th visit, progress note by 10th visit   PT Start Time 1632   PT Stop Time 1720   PT Time Calculation (min) 48 min   Activity Tolerance Patient tolerated treatment well   Behavior During Therapy [facility] for tasks assessed/performed      Past Medical History:  Diagnosis Date  . Breast cancer ([place])   . CHF (congestive heart failure) ([place])   . COPD (chronic obstructive pulmonary disease) ([place])   . Coronary artery disease   . Hypertension     Past Surgical History:  Procedure Laterality Date  . MASTECTOMY    . VALVE REPLACEMENT      There were no vitals filed for this visit.      Subjective Assessment - 06/01/16 1636    Subjective 'I am doing much better, but haven't really done the exercise but has been walking"   Currently in Pain? Yes   Pain Score 3    Pain Location Back   Pain Orientation Right   Pain Descriptors / Indicators Sore;Aching   Pain Type Chronic pain   Pain Frequency Constant                         [facility] Adult PT Treatment/Exercise - 06/01/16 1640      Lumbar Exercises: Stretches   Lower Trunk Rotation --  1 x 15 reps, verbal cues to stay in pain free range     Lumbar Exercises: Aerobic   Stationary Bike Nu-step L 5 x 6 min  UE/LE     Lumbar Exercises: Seated   Hip Flexion on Ball Strengthening;Both;10 reps  2 sets with abdominal draw in manuever   Sit to Stand 10 reps  with raised table   Sit to Stand Limitations verbal/ visual cues for proper form to  bend the knees when sitting down.    Other Seated Lumbar Exercises seate don dyna disc pelvic tilts 2 x 15  tactile/ verbal cues for proper form     Lumbar Exercises: Supine   Bent Knee Raise 20 reps;1 second  2 sets                PT Education - 06/01/16 1728    Education provided Yes   Education Details updated HEP for sit to stand, proper form of sit to stand utiliing nose over toes and sitting down touch her bottom to the seat rather than the back of her legs.    Person(s) Educated Patient   Methods Explanation;Verbal cues;Handout   Comprehension Verbalized understanding;Verbal cues required          PT Short Term Goals - 05/28/16 1745      PT SHORT TERM GOAL #1   Title pt will be I with inital HEP (06/05/2016)   Baseline not doing "I'm Lazy"   Time 4   Period Weeks   Status On-going     PT SHORT TERM GOAL #2   Title she will verbalize and demo  proper posture and lift/carrying techniques to prevent/ reduce low back and hip pain (06/05/2016)   Time 4   Period Weeks   Status Unable to assess     PT SHORT TERM GOAL #3   Title she will increase bil hip extension/ abductor strength to >/= 3+/5 for exercise progress and promote endurance (06/05/2016)   Baseline beginning work on these   Time 4   Period Weeks   Status On-going     PT SHORT TERM GOAL #4   Title demonstrate reduce muscle tightness in the low back and R hip to promote trunk mobility with </= 5/10 pain (06/05/2016)   Baseline Muscles tight , pain not consistantly 5/10   Time 4   Period Weeks   Status On-going           PT Long Term Goals - 05/06/16 1802      PT LONG TERM GOAL #1   Title she will be I with all HEP given as of last visit (07/01/2016)   Time 8   Period Weeks   Status New     PT LONG TERM GOAL #2   Title she will increase R hip strength to >/= 4/5 with </= 2/10 pain for prolonged walking/ standing activities (07/01/2016)   Time 8   Period Weeks   Status New     PT LONG  TERM GOAL #3   Title she will be able to walk/ standing/ sit for >/= 30 mintues with </= 2/10 pain for function endurance required for ADLs and pt personal goals (07/01/2016)   Time 8   Period Weeks   Status New     PT LONG TERM GOAL #4   Title she will increase trunk flexion/extension and L side bending by >/= 8 degrees for functional mobility for ADLs with no report of RLE referred pain in the last week (07/01/2016)   Time 8   Period Weeks   Status New     PT LONG TERM GOAL #5   Title she will increase her FOTO score to </= 42% limitation to demonstrate improvement in function at discharge (07/01/2016)   Time 8   Period Weeks   Status New               Plan - 06/01/16 1724    Clinical Impression Statement Mrs. Auton continues to report significant improvement in pain rated at 2-3/10. focused on core strengthening and LE strengthening. She exhibits fear of falling demonstrating limited knee flexion and using the back of her legs when sitting down with a "Plop". spent time educating proper form and how to peform it at home.    PT Next Visit Plan Nustep. Standing and sitting exercises tolerated today with a "Core" focus. IFC/Heat.   PT Home Exercise Plan pelvic tilt, piriformis stretch, supine clams, lower trunk rotation 11/30.  [place] lateral technique.  Walking at least 1 minute a day. sit to stand   Consulted and Agree with Plan of Care Patient      Patient will benefit from skilled therapeutic intervention in order to improve the following deficits and impairments:  Abnormal gait, Decreased activity tolerance, Decreased endurance, Pain, Hypomobility, Decreased range of motion, Increased fascial restricitons, Increased muscle spasms, Postural dysfunction, Improper body mechanics, Decreased strength, Decreased balance  Visit Diagnosis: Chronic right-sided low back pain, with sciatica presence unspecified  Muscle spasm of back  Abnormal posture  Muscle weakness  (generalized)     Problem List Patient Active Problem List  Diagnosis Date Noted  . Essential hypertension 02/01/2016  . Allergic rhinitis 02/01/2016  . Healthcare maintenance 01/30/2016  . Chronic low back pain 07/22/2015  . PE (pulmonary embolism) 07/11/2015  . Chronic combined systolic and diastolic congestive heart failure ([place]) 07/11/2015  . Headache, common migraine 07/11/2015  . HLD (hyperlipidemia) 07/11/2015  . Hypokalemia 07/11/2015   Starr Lake PT, DPT, LAT, ATC  06/01/16  5:32 PM      [facility] [address] [place], [place], [address] Phone: [phone]   Fax:  [phone]  Name: Kim Anthony [name]: [id number] Date of Birth: 01-Feb-1951

## 2016-06-03 ENCOUNTER — Ambulatory Visit: Payer: Medicare Other | Admitting: Physical Therapy

## 2016-06-03 DIAGNOSIS — M62830 Muscle spasm of back: Secondary | ICD-10-CM

## 2016-06-03 DIAGNOSIS — G8929 Other chronic pain: Secondary | ICD-10-CM

## 2016-06-03 DIAGNOSIS — M545 Low back pain: Secondary | ICD-10-CM | POA: Diagnosis not present

## 2016-06-03 DIAGNOSIS — R293 Abnormal posture: Secondary | ICD-10-CM | POA: Diagnosis not present

## 2016-06-03 DIAGNOSIS — M6281 Muscle weakness (generalized): Secondary | ICD-10-CM | POA: Diagnosis not present

## 2016-06-03 NOTE — Therapy (Signed)
[name], [place], [address] Phone: [phone]   Fax:  [phone]  Physical Therapy Treatment  Patient Details  Name: Kim Anthony [name]: [id number] Date of Birth: December 14, 1950 Referring Provider: Sid Falcon MD  Encounter Date: 06/03/2016      PT End of Session - 06/03/16 1748    Visit Number 6   Number of Visits 17   Date for PT Re-Evaluation 07/01/16   PT Start Time 1636   PT Stop Time 1730   PT Time Calculation (min) 54 min   Behavior During Therapy [facility] for tasks assessed/performed      Past Medical History:  Diagnosis Date  . Breast cancer ([place])   . CHF (congestive heart failure) ([place])   . COPD (chronic obstructive pulmonary disease) ([place])   . Coronary artery disease   . Hypertension     Past Surgical History:  Procedure Laterality Date  . MASTECTOMY    . VALVE REPLACEMENT      There were no vitals filed for this visit.      Subjective Assessment - 06/03/16 1717    Subjective I don't have any pain.  I have been doing a lot better.   Patient is accompained by: Family member   Currently in Pain? No/denies   Pain Location Back   Pain Orientation Right                         [facility] Adult PT Treatment/Exercise - 06/03/16 0001      Lumbar Exercises: Stretches   Passive Hamstring Stretch 3 reps;30 seconds   Lower Trunk Rotation 1 rep   Lower Trunk Rotation Limitations painful so stopped     Lumbar Exercises: Aerobic   Stationary Bike Nu-step L 5 x 6 min  LE only     Lumbar Exercises: Seated   Hip Flexion on Ball Strengthening  attempted but pain increased with sitting on ball   Sit to Stand 10 reps   Sit to Stand Limitations cues, able to use knees more, improving   Other Seated Lumbar Exercises hamstring curls with focus on good posture, red band 10 x each     Lumbar Exercises: Supine   Bridge Limitations painful so stopped     Knee/Hip Exercises:  Standing   Forward Step Up Limitations 10 X left 5 x right. stopped due to fatigue.       Moist Heat Therapy   Number Minutes Moist Heat 15 Minutes   Moist Heat Location Lumbar Spine  lateral hip     Electrical Stimulation   Electrical Stimulation Location lowback, right gluteal.   Electrical Stimulation Action IFC   Electrical Stimulation Parameters 9   Electrical Stimulation Goals Pain  with moist heat, sitting                  PT Short Term Goals - 06/03/16 1751      PT SHORT TERM GOAL #1   Title pt will be I with inital HEP (06/05/2016)   Baseline has been more active vs exercises   Time 4   Period Weeks   Status On-going     PT SHORT TERM GOAL #2   Title she will verbalize and demo proper posture and lift/carrying techniques to prevent/ reduce low back and hip pain (06/05/2016)   Time 4   Period Weeks   Status Unable to assess     PT SHORT TERM GOAL #3  PE (pulmonary embolism) 07/11/2015  . Chronic combined systolic and diastolic congestive heart failure ([place]) 07/11/2015  . Headache, common migraine 07/11/2015  . HLD (hyperlipidemia) 07/11/2015  . Hypokalemia 07/11/2015    Shareef Eddinger PTA 06/03/2016, 5:53 PM  [facility] [address] [place], [place], [address] Phone: [phone]   Fax:  [phone]  Name: Kim Anthony [name]: [id number] Date of Birth: 11/09/1950  PE (pulmonary embolism) 07/11/2015  . Chronic combined systolic and diastolic congestive heart failure ([place]) 07/11/2015  . Headache, common migraine 07/11/2015  . HLD (hyperlipidemia) 07/11/2015  . Hypokalemia 07/11/2015    Shareef Eddinger PTA 06/03/2016, 5:53 PM  [facility] [address] [place], [place], [address] Phone: [phone]   Fax:  [phone]  Name: Kim Anthony [name]: [id number] Date of Birth: 11/09/1950

## 2016-06-08 ENCOUNTER — Ambulatory Visit: Payer: Medicare Other | Admitting: Physical Therapy

## 2016-06-08 DIAGNOSIS — G8929 Other chronic pain: Secondary | ICD-10-CM

## 2016-06-08 DIAGNOSIS — R293 Abnormal posture: Secondary | ICD-10-CM | POA: Diagnosis not present

## 2016-06-08 DIAGNOSIS — M62830 Muscle spasm of back: Secondary | ICD-10-CM | POA: Diagnosis not present

## 2016-06-08 DIAGNOSIS — M6281 Muscle weakness (generalized): Secondary | ICD-10-CM

## 2016-06-08 DIAGNOSIS — M545 Low back pain: Principal | ICD-10-CM

## 2016-06-08 NOTE — Therapy (Signed)
[place], [place], [address] Phone: [phone]   Fax:  [phone]  Physical Therapy Treatment  Patient Details  Name: Kim Anthony [name]: [id number] Date of Birth: 12-25-1950 Referring Provider: Sid Falcon MD  Encounter Date: 06/08/2016      PT End of Session - 06/08/16 1703    Visit Number 7   Number of Visits 17   Date for PT Re-Evaluation 07/01/16   Authorization Type Medicare: Kx mod by 15th visit, progress note by 10th visit   PT Start Time 1637  pt arrived 7 minutes late   PT Stop Time 1701  she stated she needed to leave by 5 pm   PT Time Calculation (min) 24 min   Activity Tolerance Patient tolerated treatment well   Behavior During Therapy [facility] for tasks assessed/performed      Past Medical History:  Diagnosis Date  . Breast cancer ([place])   . CHF (congestive heart failure) ([place])   . COPD (chronic obstructive pulmonary disease) ([place])   . Coronary artery disease   . Hypertension     Past Surgical History:  Procedure Laterality Date  . MASTECTOMY    . VALVE REPLACEMENT      There were no vitals filed for this visit.      Subjective Assessment - 06/08/16 1642    Subjective "I am having more pain today at a 7/10 in the groin"    Currently in Pain? Yes   Pain Score 7    Pain Location Hip   Pain Descriptors / Indicators Aching;Sore   Pain Type Chronic pain   Pain Onset More than a month ago   Pain Frequency Intermittent   Aggravating Factors  walking, moving around   Pain Relieving Factors sitting and resting                         [facility] Adult PT Treatment/Exercise - 06/08/16 0001      Manual Therapy   Manual Therapy Joint mobilization;Other (comment)   Joint Mobilization posterior hip mobs grade 3 on the R, long axis distraction of the RLE grade 3-4   Other Manual Therapy PNF movement of the R hip 2 x 20 ea direction  avoiding end ranges to reduce pain                   PT Short Term Goals - 06/03/16 1751      PT SHORT TERM GOAL #1   Title pt will be I with inital HEP (06/05/2016)   Baseline has been more active vs exercises   Time 4   Period Weeks   Status On-going     PT SHORT TERM GOAL #2   Title she will verbalize and demo proper posture and lift/carrying techniques to prevent/ reduce low back and hip pain (06/05/2016)   Time 4   Period Weeks   Status Unable to assess     PT SHORT TERM GOAL #3   Title she will increase bil hip extension/ abductor strength to >/= 3+/5 for exercise progress and promote endurance (06/05/2016)   Baseline continue to ned strengthening   Time 4   Period Weeks   Status Unable to assess     PT SHORT TERM GOAL #4   Title demonstrate reduce muscle tightness in the low back and R hip to promote trunk mobility with </= 5/10 pain (06/05/2016)   Baseline pain intermittant   Time  4   Period Weeks   Status On-going           PT Long Term Goals - 05/06/16 1802      PT LONG TERM GOAL #1   Title she will be I with all HEP given as of last visit (07/01/2016)   Time 8   Period Weeks   Status New     PT LONG TERM GOAL #2   Title she will increase R hip strength to >/= 4/5 with </= 2/10 pain for prolonged walking/ standing activities (07/01/2016)   Time 8   Period Weeks   Status New     PT LONG TERM GOAL #3   Title she will be able to walk/ standing/ sit for >/= 30 mintues with </= 2/10 pain for function endurance required for ADLs and pt personal goals (07/01/2016)   Time 8   Period Weeks   Status New     PT LONG TERM GOAL #4   Title she will increase trunk flexion/extension and L side bending by >/= 8 degrees for functional mobility for ADLs with no report of RLE referred pain in the last week (07/01/2016)   Time 8   Period Weeks   Status New     PT LONG TERM GOAL #5   Title she will increase her FOTO score to </= 42% limitation to demonstrate improvement in function at  discharge (07/01/2016)   Time 8   Period Weeks   Status New               Plan - 06/08/16 1704    Clinical Impression Statement pt arrived 7 min late and requested to be out by 5pm due to scheduling conflict. due to shorter period of itme focused on hip mobilizations and PNF pattern avoiding end ranges. She reported pain dropped to a 4/10 post session.    PT Next Visit Plan Nustep. Standing and sitting exercises tolerated today with a "Core" focus. IFC/Heat. hip mobs posterior and long axis distraction.    PT Home Exercise Plan pelvic tilt, piriformis stretch, supine clams, lower trunk rotation 11/30.  [place] lateral technique.  Walking at least 1 minute a day. sit to stand   Consulted and Agree with Plan of Care Patient      Patient will benefit from skilled therapeutic intervention in order to improve the following deficits and impairments:  Abnormal gait, Decreased activity tolerance, Decreased endurance, Pain, Hypomobility, Decreased range of motion, Increased fascial restricitons, Increased muscle spasms, Postural dysfunction, Improper body mechanics, Decreased strength, Decreased balance  Visit Diagnosis: Chronic right-sided low back pain, with sciatica presence unspecified  Muscle spasm of back  Abnormal posture  Muscle weakness (generalized)     Problem List Patient Active Problem List   Diagnosis Date Noted  . Essential hypertension 02/01/2016  . Allergic rhinitis 02/01/2016  . Healthcare maintenance 01/30/2016  . Chronic low back pain 07/22/2015  . PE (pulmonary embolism) 07/11/2015  . Chronic combined systolic and diastolic congestive heart failure ([place]) 07/11/2015  . Headache, common migraine 07/11/2015  . HLD (hyperlipidemia) 07/11/2015  . Hypokalemia 07/11/2015   Starr Lake PT, DPT, LAT, ATC  06/08/16  5:07 PM      [facility] [address] [place], [place], [address] Phone: [phone]    Fax:  [phone]  Name: Tamaka Skirvin [name]: [id number] Date of Birth: [birth year]

## 2016-06-10 ENCOUNTER — Ambulatory Visit: Payer: Medicare Other | Admitting: Physical Therapy

## 2016-06-10 DIAGNOSIS — G8929 Other chronic pain: Secondary | ICD-10-CM | POA: Diagnosis not present

## 2016-06-10 DIAGNOSIS — M6281 Muscle weakness (generalized): Secondary | ICD-10-CM

## 2016-06-10 DIAGNOSIS — R293 Abnormal posture: Secondary | ICD-10-CM

## 2016-06-10 DIAGNOSIS — M545 Low back pain: Principal | ICD-10-CM

## 2016-06-10 DIAGNOSIS — M62830 Muscle spasm of back: Secondary | ICD-10-CM | POA: Diagnosis not present

## 2016-06-10 NOTE — Therapy (Addendum)
[place], [place], [address] Phone: [phone]   Fax:  [phone]  Physical Therapy Treatment / Discharge Note  Patient Details  Name: Kim Anthony [name]: [id number] Date of Birth: [birth year] Referring Provider: Sid Falcon MD  Encounter Date: 06/10/2016      PT End of Session - 06/10/16 1707    Visit Number 8   Number of Visits 17   Date for PT Re-Evaluation 07/01/16   Authorization Type Medicare: Kx mod by 15th visit, progress note by 10th visit   PT Start Time 1636   PT Stop Time 1725   PT Time Calculation (min) 49 min   Activity Tolerance Patient tolerated treatment well   Behavior During Therapy [facility] for tasks assessed/performed      Past Medical History:  Diagnosis Date  . Breast cancer ([place])   . CHF (congestive heart failure) ([place])   . COPD (chronic obstructive pulmonary disease) ([place])   . Coronary artery disease   . Hypertension     Past Surgical History:  Procedure Laterality Date  . MASTECTOMY    . VALVE REPLACEMENT      There were no vitals filed for this visit.      Subjective Assessment - 06/10/16 1645    Subjective "I was having significant pain in the hip Monday night which could of been from walking or from the pulling last session" pt reports today she can' t do exercise and only wants the heat today.   Currently in Pain? Yes   Pain Score 7    Pain Location Hip   Pain Orientation Right   Pain Descriptors / Indicators Sore                         [facility] Adult PT Treatment/Exercise - 06/10/16 1702      Lumbar Exercises: Stretches   Lower Trunk Rotation Other (comment)  2 x 10    Lower Trunk Rotation Limitations staying within pain free range     Lumbar Exercises: Aerobic   Stationary Bike Nu-step L 5 x 6 min     Lumbar Exercises: Supine   Bent Knee Raise 10 reps;2 seconds  x 2 sets   Other Supine Lumbar Exercises pelvic tilts 2 x 10 in lumbar  decompression position  required tactile cues for proper form     Moist Heat Therapy   Number Minutes Moist Heat 10 Minutes   Moist Heat Location Lumbar Spine  in supine     Manual Therapy   Joint Mobilization long axis distraction of the RLE grade 3-4                PT Education - 06/10/16 1706    Education provided Yes   Education Details benefits of controlled exercise to promote pain relief continue to promote function.    Person(s) Educated Patient   Methods Explanation;Verbal cues   Comprehension Verbalized understanding;Verbal cues required          PT Short Term Goals - 06/10/16 1710      PT SHORT TERM GOAL #1   Title pt will be I with inital HEP (06/05/2016)   Baseline not consistent   Time 4   Period Weeks   Status On-going     PT SHORT TERM GOAL #2   Title she will verbalize and demo proper posture and lift/carrying techniques to prevent/ reduce low back and hip pain (06/05/2016)   Time  [place], [place], [address] Phone: [phone]   Fax:  [phone]  Name: Lile Mccurley [name]: [id number] Date of Birth: 09/13/1950    PHYSICAL THERAPY DISCHARGE SUMMARY  Visits from Start of Care: 8  Current functional level related to goals / functional outcomes: See goals   Remaining deficits: Unknown    Education / Equipment: HEP, theraband, posture/ lifting   Plan: Patient agrees to discharge.  Patient goals were not met. Patient is being discharged due to not returning since the last visit.  ?????      Melvin Marmo PT, DPT, LAT, ATC  07/20/16  1:30 PM  [place], [place], [address] Phone: [phone]   Fax:  [phone]  Physical Therapy Treatment / Discharge Note  Patient Details  Name: Kim Anthony [name]: [id number] Date of Birth: [birth year] Referring Provider: Sid Falcon MD  Encounter Date: 06/10/2016      PT End of Session - 06/10/16 1707    Visit Number 8   Number of Visits 17   Date for PT Re-Evaluation 07/01/16   Authorization Type Medicare: Kx mod by 15th visit, progress note by 10th visit   PT Start Time 1636   PT Stop Time 1725   PT Time Calculation (min) 49 min   Activity Tolerance Patient tolerated treatment well   Behavior During Therapy [facility] for tasks assessed/performed      Past Medical History:  Diagnosis Date  . Breast cancer ([place])   . CHF (congestive heart failure) ([place])   . COPD (chronic obstructive pulmonary disease) ([place])   . Coronary artery disease   . Hypertension     Past Surgical History:  Procedure Laterality Date  . MASTECTOMY    . VALVE REPLACEMENT      There were no vitals filed for this visit.      Subjective Assessment - 06/10/16 1645    Subjective "I was having significant pain in the hip Monday night which could of been from walking or from the pulling last session" pt reports today she can' t do exercise and only wants the heat today.   Currently in Pain? Yes   Pain Score 7    Pain Location Hip   Pain Orientation Right   Pain Descriptors / Indicators Sore                         [facility] Adult PT Treatment/Exercise - 06/10/16 1702      Lumbar Exercises: Stretches   Lower Trunk Rotation Other (comment)  2 x 10    Lower Trunk Rotation Limitations staying within pain free range     Lumbar Exercises: Aerobic   Stationary Bike Nu-step L 5 x 6 min     Lumbar Exercises: Supine   Bent Knee Raise 10 reps;2 seconds  x 2 sets   Other Supine Lumbar Exercises pelvic tilts 2 x 10 in lumbar  decompression position  required tactile cues for proper form     Moist Heat Therapy   Number Minutes Moist Heat 10 Minutes   Moist Heat Location Lumbar Spine  in supine     Manual Therapy   Joint Mobilization long axis distraction of the RLE grade 3-4                PT Education - 06/10/16 1706    Education provided Yes   Education Details benefits of controlled exercise to promote pain relief continue to promote function.    Person(s) Educated Patient   Methods Explanation;Verbal cues   Comprehension Verbalized understanding;Verbal cues required          PT Short Term Goals - 06/10/16 1710      PT SHORT TERM GOAL #1   Title pt will be I with inital HEP (06/05/2016)   Baseline not consistent   Time 4   Period Weeks   Status On-going     PT SHORT TERM GOAL #2   Title she will verbalize and demo proper posture and lift/carrying techniques to prevent/ reduce low back and hip pain (06/05/2016)   Time

## 2016-06-30 ENCOUNTER — Ambulatory Visit: Payer: Medicare Other | Attending: Internal Medicine | Admitting: Physical Therapy

## 2016-07-02 ENCOUNTER — Ambulatory Visit: Payer: Medicare Other | Admitting: Physical Therapy

## 2016-07-08 ENCOUNTER — Ambulatory Visit: Payer: Medicare Other | Admitting: Physical Therapy

## 2016-07-14 ENCOUNTER — Ambulatory Visit: Payer: Medicare Other | Admitting: Physical Therapy

## 2016-07-15 ENCOUNTER — Ambulatory Visit: Payer: Medicare Other | Admitting: Physical Therapy

## 2016-07-20 ENCOUNTER — Ambulatory Visit: Payer: Medicare Other | Admitting: Physical Therapy

## 2016-07-22 ENCOUNTER — Ambulatory Visit: Payer: Medicare Other | Admitting: Physical Therapy

## 2016-10-02 ENCOUNTER — Other Ambulatory Visit: Payer: Self-pay | Admitting: Internal Medicine

## 2016-10-02 DIAGNOSIS — E785 Hyperlipidemia, unspecified: Secondary | ICD-10-CM

## 2016-10-02 DIAGNOSIS — I5042 Chronic combined systolic (congestive) and diastolic (congestive) heart failure: Secondary | ICD-10-CM

## 2016-10-05 ENCOUNTER — Other Ambulatory Visit: Payer: Self-pay | Admitting: Internal Medicine

## 2016-10-05 DIAGNOSIS — I5042 Chronic combined systolic (congestive) and diastolic (congestive) heart failure: Secondary | ICD-10-CM

## 2016-11-18 ENCOUNTER — Encounter: Payer: Self-pay | Admitting: Internal Medicine

## 2016-11-18 DIAGNOSIS — Z952 Presence of prosthetic heart valve: Secondary | ICD-10-CM | POA: Insufficient documentation

## 2016-11-19 ENCOUNTER — Encounter: Payer: Medicare Other | Admitting: Internal Medicine

## 2016-12-02 ENCOUNTER — Other Ambulatory Visit: Payer: Self-pay | Admitting: Internal Medicine

## 2016-12-03 ENCOUNTER — Encounter: Payer: Self-pay | Admitting: Internal Medicine

## 2016-12-03 ENCOUNTER — Ambulatory Visit (INDEPENDENT_AMBULATORY_CARE_PROVIDER_SITE_OTHER): Payer: Medicare Other | Admitting: Internal Medicine

## 2016-12-03 VITALS — BP 123/78 | HR 74 | Temp 98.7°F | Ht 65.5 in | Wt 173.2 lb

## 2016-12-03 DIAGNOSIS — M545 Low back pain: Secondary | ICD-10-CM

## 2016-12-03 DIAGNOSIS — F17210 Nicotine dependence, cigarettes, uncomplicated: Secondary | ICD-10-CM | POA: Diagnosis not present

## 2016-12-03 DIAGNOSIS — I2699 Other pulmonary embolism without acute cor pulmonale: Secondary | ICD-10-CM

## 2016-12-03 DIAGNOSIS — G8929 Other chronic pain: Secondary | ICD-10-CM

## 2016-12-03 DIAGNOSIS — Z7901 Long term (current) use of anticoagulants: Secondary | ICD-10-CM

## 2016-12-03 DIAGNOSIS — Z952 Presence of prosthetic heart valve: Secondary | ICD-10-CM

## 2016-12-03 LAB — POCT INR: INR: 2.3

## 2016-12-03 MED ORDER — FLUTICASONE PROPIONATE 50 MCG/ACT NA SUSP: 2.0000 | Freq: Every day | NASAL | 2 refills | Status: DC

## 2016-12-03 MED ORDER — NAPROXEN 500 MG PO TABS: 500.0000 mg | ORAL_TABLET | Freq: Two times a day (BID) | ORAL | 0 refills | Status: DC

## 2016-12-03 MED ORDER — WARFARIN SODIUM 4 MG PO TABS: 4.0000 mg | ORAL_TABLET | Freq: Every day | ORAL | 1 refills | Status: DC

## 2016-12-03 NOTE — Assessment & Plan Note (Addendum)
Assessment: She was evaluated last year for her back pain, most recently in Nov 2017.  Went to PT for about 1-2 months then stopped.  Reports they told her she had very weak low back and core stability muscles but the PT was helpful once she would go.  Was doing well for about 6 months.  About 2-3 days ago, her back pain flared up.  No clear cause.  She experiences the same pain as before with some radiculopathy.  No bowel or bladder incontinence.  No loss of strength.  Has not tried anything for relief other than taking Baclofen QHS PRN.  She remembers the PT exercises she learned.  Suspect she has an acute flare of her chronic low back pain and lumbosacral muscle strain.  Plan: - continue taking Baclofen QHS PRN - short course of Naproxen 500mg  BID x 5 days.  Cautioned her with use with Coumadin - Tylenol interchangeably with Naproxen - Heat and ice - Start home PT exercises after pain settles down - recommended conservative management first.  Would consider referral back to PT vs referral for injections.  May need more advanced imaging prior to proceeding with injections - RTC 2 weeks if needed.

## 2016-12-03 NOTE — Progress Notes (Signed)
   CC: here for Low Back Pain and INR check   HPI:  Kim Anthony is a 66 y.o. woman with a past medical history listed below here today for follow up of her low back pain and INR check.  For details of today's visit and the status of her chronic medical issues please refer to the assessment and plan.   Past Medical History:  Diagnosis Date  . Breast cancer ([place])   . CHF (congestive heart failure) ([place])   . COPD (chronic obstructive pulmonary disease) ([place])   . Coronary artery disease   . History of mitral valve replacement with mechanical valve   . HLD (hyperlipidemia)   . Hypertension   . Pulmonary embolism ([place])     Review of Systems:  Please see pertinent ROS reviewed in HPI and problem based charting.   Physical Exam:  Vitals:   12/03/16 1511  BP: 123/78  Pulse: 74  Temp: 98.7 F (37.1 C)  TempSrc: Oral  SpO2: 98%  Weight: 173 lb 3.2 oz (78.6 kg)  Height: 5' 5.5" (1.664 m)   General: sitting on exam table, NAD HEENT: [place]/AT, EOMI, no scleral icterus Cardiac: RRR Pulm: normal effort Ext: warm and well perfused, no pedal edema, 5/5 LE strength, positive paraspinal lumbar spasm R>L Neuro: alert and oriented X3, cranial nerves II-XII grossly intact   Assessment & Plan:   See Encounters Tab for problem based charting.  Patient discussed with Dr. Dareen Piano.  Pulmonary embolism [facility]) Assessment: Has not had her INR checked since Oct 2017.  Today it is 2.3.  Her goal is 2.5-3.5 for mechanical MV.  No chest pain or SOB.   Plan: - seen in conjunction today with pharmacist (Dr. [name]) who adjusted her coumadin - f/u INR clinic in 2 weeks  Chronic low back pain Assessment: She was evaluated last year for her back pain, most recently in Nov 2017.  Went to PT for about 1-2 months then stopped.  Reports they told her she had very weak low back and core stability muscles but the PT was helpful once she would go.  Was doing well for about 6 months.  About 2-3 days ago,  her back pain flared up.  No clear cause.  She experiences the same pain as before with some radiculopathy.  No bowel or bladder incontinence.  No loss of strength.  Has not tried anything for relief other than taking Baclofen QHS PRN.  She remembers the PT exercises she learned.  Suspect she has an acute flare of her chronic low back pain and lumbosacral muscle strain.  Plan: - continue taking Baclofen QHS PRN - short course of Naproxen 500mg  BID x 5 days.  Cautioned her with use with Coumadin - Tylenol interchangeably with Naproxen - Heat and ice - Start home PT exercises after pain settles down - recommended conservative management first.  Would consider referral back to PT vs referral for injections.  May need more advanced imaging prior to proceeding with injections - RTC 2 weeks if needed.

## 2016-12-03 NOTE — Patient Instructions (Signed)
Thank you for coming to see me today. It was a pleasure. Today we talked about:   Back Pain - you seem to have an acute lumbar spine muscle strain - continue to take your Baclofen as needed for muscle spasm. - take Tyelenol as needed - I have given you a short 5 day course of prescription strength Naproxen.  Please monitor for bleeding while taking this with Warfarin - as your back pain improves, try to incorporate some of the strengthening and stretching exercises from physical therapy - use a heating pad on your back as needed  Please follow-up with [place] in 2 weeks to recheck your back pain.  If you have any questions or concerns, please do not hesitate to call the office at [phone].  Take Care,   Jule Ser, DO

## 2016-12-03 NOTE — Assessment & Plan Note (Signed)
Assessment: Has not had her INR checked since Oct 2017.  Today it is 2.3.  Her goal is 2.5-3.5 for mechanical MV.  No chest pain or SOB.   Plan: - seen in conjunction today with pharmacist (Dr. [name]) who adjusted her coumadin - f/u INR clinic in 2 weeks

## 2016-12-03 NOTE — Progress Notes (Signed)
Anticoagulation Management Indication: PE history, mechanical mitral valve  Duration: indefinite Supervising physician: Murriel Hopper  Anticoagulation Clinic Visit History: Patient does not report signs/symptoms of bleeding or thromboembolism  Anticoagulation Episode Summary    Current INR goal:   2.5-3.5  TTR:   42.7 % (1.3 y)  Next INR check:   12/17/2016  INR from last check:   2.3! (12/03/2016)  Weekly max warfarin dose:     Target end date:     INR check location:   [facility]  Preferred lab:     Send INR reminders to:      Indications   Pulmonary embolism ([place]) [I26.99]       Comments:          ASSESSMENT Recent Results: The most recent result is correlated with 34 mg per week: Lab Results  Component Value Date   INR 2.3 12/03/2016   INR 2.0 04/20/2016   INR 4.5 03/24/2016   Anticoagulation Dosing: INR as of 12/03/2016 and Previous Warfarin Dosing Information    INR Dt INR Goal [employer] Sun Mon Tue Wed Thu Fri Sat   12/03/2016 2.3 2.5-3.5 34 mg 6 mg 6 mg 4 mg 4 mg 6 mg 4 mg 4 mg    Anticoagulation Warfarin Dose Instructions as of 12/03/2016      Total Sun Mon Tue Wed Thu Fri Sat   New Dose 36 mg 6 mg 4 mg 6 mg 4 mg 6 mg 4 mg 6 mg     (4 mg x 1.5)  (4 mg x 1)  (4 mg x 1.5)  (4 mg x 1)  (4 mg x 1.5)  (4 mg x 1)  (4 mg x 1.5)                           INR today: Subtherapeutic  PLAN Weekly dose was increased by 6% to 36 mg per week  Patient was educated on above instructions and was advised to contact clinic or seek medical attention if signs/symptoms of bleeding or thromboembolism occur.  Patient verbalized understanding by repeating back information and was advised to contact me if further medication-related questions arise. Patient was also provided an information handout.  Follow-up Return in about 2 weeks (around 12/17/2016) for back pain.  [name]

## 2016-12-07 ENCOUNTER — Telehealth: Payer: Self-pay

## 2016-12-07 NOTE — Telephone Encounter (Signed)
Fax from pharmacy use of naproxen with warfin may increase risk of GI bleeding. Please advise

## 2016-12-09 NOTE — Telephone Encounter (Signed)
Short course of Naproxen (5 days) for acute lumbar strain should be okay.  No hx of GI bleed per chart review.  Advised patient to monitor for GI upset, sign/symptoms of bleeding.  Thanks

## 2016-12-11 DIAGNOSIS — L600 Ingrowing nail: Secondary | ICD-10-CM | POA: Diagnosis not present

## 2016-12-11 DIAGNOSIS — M25775 Osteophyte, left foot: Secondary | ICD-10-CM | POA: Diagnosis not present

## 2016-12-15 ENCOUNTER — Telehealth: Payer: Self-pay | Admitting: *Deleted

## 2016-12-15 NOTE — Telephone Encounter (Signed)
I saw Kim Anthony about 12 days ago for acute on chronic lumbar back pain.  Short course of Naproxen (5 days) for acute lumbar strain should be okay and was prescribed.  She has no hx of GI bleed per chart review.  Advised patient to monitor for GI upset, sign/symptoms of bleeding.  It was a 1-time prescription for her so should not be continued if she has completed it.  If she still has pain, I advised her to RTC for a follow up appointment.

## 2016-12-15 NOTE — Telephone Encounter (Signed)
Pharmacy calls and states that there is potential interaction with naproxen and warfarin, would like for you to determine if these should continue.

## 2016-12-16 NOTE — Progress Notes (Signed)
Internal Medicine Clinic Attending  Case discussed with Dr. Wallace at the time of the visit.  We reviewed the resident's history and exam and pertinent patient test results.  I agree with the assessment, diagnosis, and plan of care documented in the resident's note.  

## 2016-12-17 ENCOUNTER — Ambulatory Visit: Payer: Medicare Other | Admitting: Pharmacist

## 2016-12-21 ENCOUNTER — Ambulatory Visit (INDEPENDENT_AMBULATORY_CARE_PROVIDER_SITE_OTHER): Payer: Medicare Other | Admitting: Pharmacist

## 2016-12-21 DIAGNOSIS — Z952 Presence of prosthetic heart valve: Secondary | ICD-10-CM | POA: Diagnosis not present

## 2016-12-21 DIAGNOSIS — I2699 Other pulmonary embolism without acute cor pulmonale: Secondary | ICD-10-CM | POA: Diagnosis present

## 2016-12-21 DIAGNOSIS — Z7901 Long term (current) use of anticoagulants: Secondary | ICD-10-CM

## 2016-12-21 LAB — POCT INR: INR: 3

## 2016-12-21 NOTE — Patient Instructions (Signed)
Patient educated about medication as defined in this encounter and verbalized understanding by repeating back instructions provided.   

## 2016-12-21 NOTE — Progress Notes (Signed)
Anticoagulation Management Kim Anthony is a 66 y.o. female who reports to the clinic for monitoring of warfarin treatment.    Indication: history of PE and mechanical mitral valve Duration: indefinite Supervising physician: Kim Anthony  Anticoagulation Clinic Visit History: Patient does not report signs/symptoms of bleeding or thromboembolism  Anticoagulation Episode Summary    Current INR goal:   2.5-3.5  TTR:   43.7 % (1.4 y)  Next INR check:   01/11/2017  INR from last check:   3.0 (12/21/2016)  Weekly max warfarin dose:     Target end date:     INR check location:   [facility]  Preferred lab:     Send INR reminders to:      Indications   Pulmonary embolism ([place]) [I26.99]       Comments:          ASSESSMENT Recent Results: The most recent result is correlated with 36 mg per week: Lab Results  Component Value Date   INR 3.0 12/21/2016   INR 2.3 12/03/2016   INR 2.0 04/20/2016   Anticoagulation Dosing: INR as of 12/21/2016 and Previous Warfarin Dosing Information    INR Dt INR Goal [employer] Sun Mon Tue Wed Thu Fri Sat   12/21/2016 3.0 2.5-3.5 36 mg 6 mg 4 mg 6 mg 4 mg 6 mg 4 mg 6 mg    Anticoagulation Warfarin Dose Instructions as of 12/21/2016      Total Sun Mon Tue Wed Thu Fri Sat   New Dose 36 mg 6 mg 4 mg 6 mg 4 mg 6 mg 4 mg 6 mg     (4 mg x 1.5)  (4 mg x 1)  (4 mg x 1.5)  (4 mg x 1)  (4 mg x 1.5)  (4 mg x 1)  (4 mg x 1.5)                           INR today: Therapeutic  PLAN Weekly dose was unchanged   Patient Instructions  Patient educated about medication as defined in this encounter and verbalized understanding by repeating back instructions provided.   Patient advised to contact clinic or seek medical attention if signs/symptoms of bleeding or thromboembolism occur.  Patient verbalized understanding by repeating back information and was advised to contact me if further medication-related questions arise. Patient was also provided an  information handout.  Follow-up Return in about 3 weeks (around 01/11/2017).  Kim Anthony

## 2016-12-22 NOTE — Progress Notes (Signed)
INTERNAL MEDICINE TEACHING ATTENDING ADDENDUM ° °I agree with pharmacy recommendations as outlined in their note.  ° °-Klaus Casteneda MD ° °

## 2016-12-31 ENCOUNTER — Other Ambulatory Visit: Payer: Self-pay | Admitting: Internal Medicine

## 2017-01-01 ENCOUNTER — Telehealth: Payer: Self-pay | Admitting: Internal Medicine

## 2017-01-01 NOTE — Telephone Encounter (Signed)
Called patient and her podiatrist regarding form received to come off Warfarin for needed procedure to remove an ingrown toenail. I spoke with her podiatrist (Dr. Coralie Common) regarding procedure to remove an ingrown toenail and he would still like for her to come off blood thinner due to tendency for bleeding after this procedure.  I also called patient to see if her issues had resolved and whether procedure was still necessary.  She expressed that she was still having issues as well as a bone spur and would like to proceed despite the risks.   She has a mechanical mitral valve and as well as hx of PE when off Coumadin.  She is a high-risk patient having a procedure with low bleeding risk.   Therefore, pre-procedure I would recommend: Warfarin: stop for 5 days before procedure. Begin lovenox bridge 3 days before procedure; decrease to single 1 mg/kg dose 24 hours before procedure. (dose of lovenox 1.5 mg/kg/day except decrease to 1 mg/kg day before procedure.). Post-procedure: Resume Warfarin: night of procedure. Lovenox bridge: begin 24 hours after procedure if patient on warfarin; continue until warfarin therapeutic. I discussed this with patient over the phone and she is agreeable.  I told her I would want her to be seen in clinic so she explicitly understands these instructions and perhaps care can be coordinated with Dr. Maudie Mercury as she will need INR monitoring.  She agrees.  Please schedule for [facility] and/or pharmacy visit.  She is otherwise not cleared to come off her anticoagulation until seen in clinic.  Jule Ser 01/01/2017, 10:02 AM PGY-3, [facility] Pager: [phone]

## 2017-01-05 DIAGNOSIS — L600 Ingrowing nail: Secondary | ICD-10-CM | POA: Diagnosis not present

## 2017-01-06 ENCOUNTER — Ambulatory Visit: Payer: Medicare Other | Admitting: Pharmacist

## 2017-01-06 ENCOUNTER — Ambulatory Visit: Payer: Medicare Other

## 2017-01-06 ENCOUNTER — Encounter: Payer: Self-pay | Admitting: Internal Medicine

## 2017-01-06 ENCOUNTER — Telehealth: Payer: Self-pay | Admitting: Pharmacist

## 2017-01-06 NOTE — Telephone Encounter (Signed)
Spoke to patient about plans for perioperative anticoagulation. Patient states she went to podiatrist yesterday who stated they will schedule procedure after patient is cleared by PCP. Patient states she has to reschedule todays [facility] appointment. Will work with patient and team on perioperative anticoagulation.  Patient verbalized understanding by repeat back.

## 2017-01-07 DIAGNOSIS — G5603 Carpal tunnel syndrome, bilateral upper limbs: Secondary | ICD-10-CM | POA: Diagnosis not present

## 2017-01-07 DIAGNOSIS — M5417 Radiculopathy, lumbosacral region: Secondary | ICD-10-CM | POA: Diagnosis not present

## 2017-01-07 DIAGNOSIS — M542 Cervicalgia: Secondary | ICD-10-CM | POA: Diagnosis not present

## 2017-01-07 DIAGNOSIS — M5412 Radiculopathy, cervical region: Secondary | ICD-10-CM | POA: Diagnosis not present

## 2017-01-07 DIAGNOSIS — G2581 Restless legs syndrome: Secondary | ICD-10-CM | POA: Diagnosis not present

## 2017-01-07 DIAGNOSIS — M545 Low back pain: Secondary | ICD-10-CM | POA: Diagnosis not present

## 2017-01-27 ENCOUNTER — Emergency Department (HOSPITAL_COMMUNITY): Payer: Medicare Other

## 2017-01-27 ENCOUNTER — Encounter (HOSPITAL_COMMUNITY): Payer: Self-pay

## 2017-01-27 DIAGNOSIS — Z7901 Long term (current) use of anticoagulants: Secondary | ICD-10-CM | POA: Insufficient documentation

## 2017-01-27 DIAGNOSIS — R072 Precordial pain: Secondary | ICD-10-CM | POA: Diagnosis not present

## 2017-01-27 DIAGNOSIS — Z79899 Other long term (current) drug therapy: Secondary | ICD-10-CM | POA: Diagnosis not present

## 2017-01-27 DIAGNOSIS — I10 Essential (primary) hypertension: Secondary | ICD-10-CM | POA: Diagnosis not present

## 2017-01-27 DIAGNOSIS — J439 Emphysema, unspecified: Secondary | ICD-10-CM | POA: Diagnosis not present

## 2017-01-27 DIAGNOSIS — F17210 Nicotine dependence, cigarettes, uncomplicated: Secondary | ICD-10-CM | POA: Insufficient documentation

## 2017-01-27 DIAGNOSIS — J029 Acute pharyngitis, unspecified: Secondary | ICD-10-CM | POA: Diagnosis present

## 2017-01-27 DIAGNOSIS — R079 Chest pain, unspecified: Secondary | ICD-10-CM | POA: Diagnosis not present

## 2017-01-27 LAB — CBC
HCT: 36.6 % (ref 36.0–46.0)
Hemoglobin: 11.7 g/dL — ABNORMAL LOW (ref 12.0–15.0)
MCH: 31.1 pg (ref 26.0–34.0)
MCHC: 32 g/dL (ref 30.0–36.0)
MCV: 97.3 fL (ref 78.0–100.0)
PLATELETS: 347 10*3/uL (ref 150–400)
RBC: 3.76 MIL/uL — ABNORMAL LOW (ref 3.87–5.11)
RDW: 12.9 % (ref 11.5–15.5)
WBC: 10.7 10*3/uL — AB (ref 4.0–10.5)

## 2017-01-27 LAB — BASIC METABOLIC PANEL
Anion gap: 13 (ref 5–15)
BUN: 13 mg/dL (ref 6–20)
CALCIUM: 8.8 mg/dL — AB (ref 8.9–10.3)
CHLORIDE: 103 mmol/L (ref 101–111)
CO2: 25 mmol/L (ref 22–32)
CREATININE: 0.78 mg/dL (ref 0.44–1.00)
GFR calc Af Amer: >60 mL/min (ref >60)
GFR calc non Af Amer: >60 mL/min (ref >60)
Glucose, Bld: 89 mg/dL (ref 65–99)
Potassium: 3.7 mmol/L (ref 3.5–5.1)
SODIUM: 141 mmol/L (ref 135–145)

## 2017-01-27 LAB — I-STAT TROPONIN, ED: TROPONIN I, POC: 0.01 ng/mL (ref 0.00–0.08)

## 2017-01-27 NOTE — ED Notes (Signed)
Pt came to front desk and advised this RN that she is going outside to smoke.

## 2017-01-27 NOTE — ED Triage Notes (Signed)
Pt from home with complains of throat burning and chest burning. Pt states "I think I have reflex" Pt denies pain at this time. Has hx of mechanical valve replacement. VSS

## 2017-01-28 ENCOUNTER — Emergency Department (HOSPITAL_COMMUNITY): Payer: Medicare Other

## 2017-01-28 ENCOUNTER — Encounter: Payer: Self-pay | Admitting: Internal Medicine

## 2017-01-28 ENCOUNTER — Emergency Department (HOSPITAL_COMMUNITY)
Admission: EM | Admit: 2017-01-28 | Discharge: 2017-01-28 | Disposition: A | Discharge status: Home / Self Care | Payer: Medicare Other | Attending: Emergency Medicine | Admitting: Emergency Medicine

## 2017-01-28 DIAGNOSIS — R072 Precordial pain: Secondary | ICD-10-CM

## 2017-01-28 DIAGNOSIS — J439 Emphysema, unspecified: Secondary | ICD-10-CM | POA: Diagnosis not present

## 2017-01-28 LAB — I-STAT TROPONIN, ED: Troponin i, poc: 0 ng/mL (ref 0.00–0.08)

## 2017-01-28 MED ORDER — IOPAMIDOL (ISOVUE-300) INJECTION 61%
INTRAVENOUS | Status: AC
  Administered 2017-01-28: 75 mL
  Filled 2017-01-28: qty 75

## 2017-01-28 NOTE — ED Provider Notes (Signed)
[facility] DEPT Provider Note   CSN: [id number] Arrival date & time: 01/27/17  1632   By signing my name below, I, Eunice Blase, attest that this documentation has been prepared under the direction and in the presence of Jola Schmidt, MD. Electronically signed, Eunice Blase, ED Scribe. 01/28/17. 3:41 AM.   History   Chief Complaint Chief Complaint  Patient presents with  . Sore Throat  . Chest Pain   The history is provided by the patient and medical records. No language interpreter was used.    Kim Anthony is a 66 y.o. female with h/o HTN presenting to the Emergency Department concerning episodic R sided chest pain lasting 15 minutes last night. Pt states her pain radiated to the posterior R side of the ribs. Associated burning throat pain, ear discomfort, dizziness, productive cough and rhinorrhea. She also notes occasional leg weakness recently. Pt's symptoms have subsided on evaluation. She states she has taken Zantac with minimal relief to ear discomfort and dizziness. H/o HTN and HLD noted. H/o Valve replacement reported. She states she takes coumadin as advised at home. No h/o heart attack or DM. Tobacco use noted. Pt followed by Dr. Juleen China of internal medicine for primary care. No diaphoresis, SOB, leg swelling or any other complaints noted at this time.   Past Medical History:  Diagnosis Date  . Hypertension     There are no active problems to display for this patient.   Past Surgical History:  Procedure Laterality Date  . MECHANICAL AORTIC AND MITRAL VALVE REPLACEMENT      OB History    No data available       Home Medications    Prior to Admission medications   Medication Sig Start Date End Date Taking? Authorizing Provider  furosemide (LASIX) 40 MG tablet Take 40 mg by mouth daily.   Yes [provider]  lisinopril (PRINIVIL,ZESTRIL) 10 MG tablet Take 10 mg by mouth daily.   Yes [provider]  metoprolol tartrate (LOPRESSOR) 25 MG  tablet Take 25 mg by mouth 2 (two) times daily.   Yes [provider]  potassium chloride (KLOR-CON) 20 MEQ packet Take 20 mEq by mouth daily.   Yes [provider]  simvastatin (ZOCOR) 20 MG tablet Take 20 mg by mouth every evening.   Yes [provider]  warfarin (COUMADIN) 4 MG tablet Take 4-6 mg by mouth See admin instructions. Take 1 and 1/2 tablets on Monday, Wednesday and Friday then take 1 tablet all the other days   Yes [provider]    Family History History reviewed. No pertinent family history.  Social History Social History  Substance Use Topics  . Smoking status: Current Every Day Smoker    Packs/day: 1.00    Types: Cigarettes  . Smokeless tobacco: Not on file  . Alcohol use No     Allergies   Patient has no known allergies.   Review of Systems Review of Systems  Constitutional: Negative for diaphoresis and fever.  HENT: Positive for ear pain, rhinorrhea and sore throat.   Respiratory: Positive for cough. Negative for shortness of breath.   Cardiovascular: Positive for chest pain. Negative for leg swelling.  Gastrointestinal: Negative for nausea.  Musculoskeletal: Positive for arthralgias.  Skin: Negative for color change and wound.  Neurological: Positive for dizziness.  Hematological: Bruises/bleeds easily.  All other systems reviewed and are negative.    Physical Exam Updated Vital Signs BP 135/66 (BP Location: Right Arm)   Pulse 88  Temp 98 F (36.7 C) (Oral)   Resp 18   Ht 5\' 5"  (1.651 m)   Wt 176 lb (79.8 kg)   SpO2 98%   BMI 29.29 kg/m   Physical Exam  Constitutional: She is oriented to person, place, and time. She appears well-developed and well-nourished. No distress.  HENT:  Head: Normocephalic and atraumatic.  Eyes: EOM are normal.  Neck: Normal range of motion.  Cardiovascular: Normal rate, regular rhythm and normal heart sounds.   Pulmonary/Chest: Effort normal and breath sounds normal.    Abdominal: Soft. She exhibits no distension. There is no tenderness.  Musculoskeletal: Normal range of motion.  Neurological: She is alert and oriented to person, place, and time.  Skin: Skin is warm and dry.  Psychiatric: She has a normal mood and affect. Judgment normal.  Nursing note and vitals reviewed.    ED Treatments / Results  DIAGNOSTIC STUDIES: Oxygen Saturation is 98% on RA, NL by my interpretation.    COORDINATION OF CARE: 3:39 AM-Discussed next steps with pt. Pt verbalized understanding and is agreeable with the plan. Will order imaging.   Labs (all labs ordered are listed, but only abnormal results are displayed) Labs Reviewed  BASIC METABOLIC PANEL - Abnormal; Notable for the following:       Result Value   Calcium 8.8 (*)    All other components within normal limits  CBC - Abnormal; Notable for the following:    WBC 10.7 (*)    RBC 3.76 (*)    Hemoglobin 11.7 (*)    All other components within normal limits  PROTIME-INR  I-STAT TROPONIN, ED  I-STAT TROPONIN, ED    EKG  EKG Interpretation  Date/Time:  Wednesday January 27 2017 19:04:41 EDT Ventricular Rate:  72 PR Interval:  156 QRS Duration: 98 QT Interval:  456 QTC Calculation: 499 R Axis:   31 Text Interpretation:  Normal sinus rhythm Incomplete right bundle branch block Cannot rule out Anterior infarct , age undetermined ST & T wave abnormality, consider lateral ischemia Abnormal ECG No old tracing to compare Confirmed by Jola Schmidt [phone]) on 01/28/2017 3:08:36 AM       Radiology Dg Chest 2 View  Result Date: 01/27/2017 CLINICAL DATA:  Pt c/o "burning" chest pain. Hx of HTN AND mechanical valve replacement. Pt is a current smoker. EXAM: CHEST  2 VIEW COMPARISON:  None. FINDINGS: Status post median sternotomy and valve replacement. The heart is mildly enlarged. There are faint airspace filling opacities in the lungs bilaterally, left greater than right. Slightly prominent interstitial markings  are noted at the bases. There is perihilar peribronchial thickening. IMPRESSION: 1. Cardiomegaly. 2. Name pulmonary opacities may indicate mild edema and/or infectious process. Prior films may be helpful if available. Electronically Signed   By: Nolon Nations M.D.   On: 01/27/2017 19:50   Ct Chest W Contrast  Result Date: 01/28/2017 CLINICAL DATA:  Acute onset of right-sided chest pain. Throat and chest burning. Initial encounter. EXAM: CT CHEST WITH CONTRAST TECHNIQUE: Multidetector CT imaging of the chest was performed during intravenous contrast administration. CONTRAST:  64mL ISOVUE-300 IOPAMIDOL (ISOVUE-300) INJECTION 61% COMPARISON:  Chest radiograph performed 01/27/2017 FINDINGS: Cardiovascular: Diffuse coronary artery calcifications are seen. The patient is status post median sternotomy. The great vessels are grossly unremarkable. The thoracic aorta is unremarkable in appearance. A mitral valve replacement is noted. Mediastinum/Nodes: Enlarged right paratracheal nodes measure up to 1.8 cm in short axis. No pericardial effusion is identified. The thyroid gland is unremarkable. No  axillary lymphadenopathy is appreciated. Lungs/Pleura: Bilateral emphysema is noted. Rounded scarring is noted at the left lung apex, measuring 3.4 cm. Scarring and calcification are noted at the anterior aspect of the left lower lobe. No pleural effusion or pneumothorax is seen. Upper Abdomen: The visualized portions of the liver and spleen are grossly unremarkable. The visualized portions of the pancreas and adrenal glands are within normal limits. The visualized portions of the kidneys are unremarkable. Musculoskeletal: No acute osseous abnormalities are identified. The visualized musculature is unremarkable in appearance. The patient is status post left-sided mastectomy. IMPRESSION: 1. No definite acute abnormality seen within the chest. 2. Rounded scarring at the left lung apex measures 3.4 cm. Scarring and calcification  along the anterior aspect of the left lower lung lobe. 3. Bilateral emphysema noted. 4. Enlarged right paratracheal nodes measure up to 1.8 cm in short axis. The largest may be amenable to biopsy, as deemed clinically appropriate. 5. Diffuse coronary artery calcifications. Electronically Signed   By: Garald Balding M.D.   On: 01/28/2017 04:47    Procedures Procedures (including critical care time)  Medications Ordered in ED Medications  iopamidol (ISOVUE-300) 61 % injection (75 mLs  Contrast Given 01/28/17 0423)     Initial Impression / Assessment and Plan / ED Course  I have reviewed the triage vital signs and the nursing notes.  Pertinent labs & imaging results that were available during my care of the patient were reviewed by me and considered in my medical decision making (see chart for details).     6:25 AM Patient feels much better this time.  Atypical story for ACS.  Doubt ACS.  Doubt PE.  Initial chest x-ray with subtle pulmonary abnormalities but on CT imaging these appear to be more scarring.  Primary care follow-up.  Patient is comfortable with discharge home.  She understands to return to the ER for new or worsening symptoms  Final Clinical Impressions(s) / ED Diagnoses   Final diagnoses:  Precordial pain    New Prescriptions New Prescriptions   No medications on file   I personally performed the services described in this documentation, which was scribed in my presence. The recorded information has been reviewed and is accurate.        Jola Schmidt, MD 01/28/17 [phone]

## 2017-01-28 NOTE — ED Notes (Signed)
Pt requesting update from MD regarding results so she can go home

## 2017-02-02 ENCOUNTER — Telehealth: Payer: Self-pay | Admitting: Pharmacist

## 2017-02-04 DIAGNOSIS — G5603 Carpal tunnel syndrome, bilateral upper limbs: Secondary | ICD-10-CM | POA: Diagnosis not present

## 2017-02-04 DIAGNOSIS — M5412 Radiculopathy, cervical region: Secondary | ICD-10-CM | POA: Diagnosis not present

## 2017-02-04 DIAGNOSIS — G2581 Restless legs syndrome: Secondary | ICD-10-CM | POA: Diagnosis not present

## 2017-02-04 DIAGNOSIS — M545 Low back pain: Secondary | ICD-10-CM | POA: Diagnosis not present

## 2017-02-04 DIAGNOSIS — M546 Pain in thoracic spine: Secondary | ICD-10-CM | POA: Diagnosis not present

## 2017-02-08 ENCOUNTER — Other Ambulatory Visit: Payer: Self-pay | Admitting: Internal Medicine

## 2017-02-08 NOTE — Progress Notes (Signed)
Tried to contact patient to schedule INR monitoring, unable to reach

## 2017-02-12 ENCOUNTER — Ambulatory Visit: Payer: Medicare Other

## 2017-02-17 ENCOUNTER — Ambulatory Visit: Payer: Medicare Other

## 2017-02-23 ENCOUNTER — Ambulatory Visit: Payer: Medicare Other

## 2017-02-25 ENCOUNTER — Ambulatory Visit (INDEPENDENT_AMBULATORY_CARE_PROVIDER_SITE_OTHER): Payer: Medicare Other | Admitting: Internal Medicine

## 2017-02-25 ENCOUNTER — Encounter: Payer: Self-pay | Admitting: Internal Medicine

## 2017-02-25 ENCOUNTER — Ambulatory Visit (INDEPENDENT_AMBULATORY_CARE_PROVIDER_SITE_OTHER): Payer: Medicare Other | Admitting: Pharmacist

## 2017-02-25 DIAGNOSIS — F17210 Nicotine dependence, cigarettes, uncomplicated: Secondary | ICD-10-CM | POA: Diagnosis not present

## 2017-02-25 DIAGNOSIS — Z86711 Personal history of pulmonary embolism: Secondary | ICD-10-CM | POA: Diagnosis not present

## 2017-02-25 DIAGNOSIS — I2699 Other pulmonary embolism without acute cor pulmonale: Secondary | ICD-10-CM | POA: Diagnosis present

## 2017-02-25 DIAGNOSIS — Z7901 Long term (current) use of anticoagulants: Secondary | ICD-10-CM

## 2017-02-25 DIAGNOSIS — Z952 Presence of prosthetic heart valve: Secondary | ICD-10-CM | POA: Diagnosis not present

## 2017-02-25 DIAGNOSIS — M799 Soft tissue disorder, unspecified: Secondary | ICD-10-CM

## 2017-02-25 DIAGNOSIS — J449 Chronic obstructive pulmonary disease, unspecified: Secondary | ICD-10-CM | POA: Diagnosis not present

## 2017-02-25 DIAGNOSIS — M62838 Other muscle spasm: Secondary | ICD-10-CM | POA: Diagnosis not present

## 2017-02-25 DIAGNOSIS — E785 Hyperlipidemia, unspecified: Secondary | ICD-10-CM | POA: Diagnosis not present

## 2017-02-25 DIAGNOSIS — I110 Hypertensive heart disease with heart failure: Secondary | ICD-10-CM | POA: Diagnosis not present

## 2017-02-25 DIAGNOSIS — I5042 Chronic combined systolic (congestive) and diastolic (congestive) heart failure: Secondary | ICD-10-CM | POA: Diagnosis not present

## 2017-02-25 DIAGNOSIS — R590 Localized enlarged lymph nodes: Secondary | ICD-10-CM | POA: Diagnosis not present

## 2017-02-25 DIAGNOSIS — I2510 Atherosclerotic heart disease of native coronary artery without angina pectoris: Secondary | ICD-10-CM | POA: Diagnosis not present

## 2017-02-25 DIAGNOSIS — F17200 Nicotine dependence, unspecified, uncomplicated: Secondary | ICD-10-CM

## 2017-02-25 DIAGNOSIS — M7989 Other specified soft tissue disorders: Secondary | ICD-10-CM

## 2017-02-25 LAB — POCT INR: INR: 2

## 2017-02-25 MED ORDER — BACLOFEN 10 MG PO TABS: 10.0000 mg | ORAL_TABLET | Freq: Every day | ORAL | 0 refills | Status: DC

## 2017-02-25 MED ORDER — DICLOFENAC SODIUM 1 % TD GEL: 2.0000 g | Freq: Three times a day (TID) | TRANSDERMAL | 0 refills | Status: DC | PRN

## 2017-02-25 MED ORDER — NICOTINE 14 MG/24HR TD PT24: 14.0000 mg | MEDICATED_PATCH | TRANSDERMAL | 0 refills | Status: DC

## 2017-02-25 MED ORDER — WARFARIN SODIUM 6 MG PO TABS: 6.0000 mg | ORAL_TABLET | Freq: Every day | ORAL | 1 refills | Status: DC

## 2017-02-25 NOTE — Patient Instructions (Addendum)
For your neck pain and muscle spasm, continue baclofen as needed. Use heating pad over the area a 3-4 times a day and do some gentle neck stretching throughout the day. Use voltaren gel over the area 3-4 times a day (alternating with heating pad).  Please make an appointment in the INR clinic in 2 weeks and make an appointment to see your PCP (Dr. Juleen China) in about 3 months.   If you notice the front swelling getting bigger or changing, please come back to clinic for a re-evaluation.

## 2017-02-25 NOTE — Progress Notes (Signed)
   CC: Neck pain  HPI:  [name] is a 66 y.o. with a PMH of breast cancer s/p left mastectomy/chemo/radiation in early 2000s, combined CHF, mechanical MVR on warfarin, HTN, chronic low back pain, and tobacco use disorder presenting to clinic for evaluation of neck pain.  Patient states that she has had neck pain for over 6 months with feeling of mass/lump in her posterior, right upper back. She also endorses a separate swelling on the right upper chest that is also painful and tender for similar duration of time. Pain does not radiate to her right arm and there is no cervical spinal pain. Patient denies recent or prior trauma to the area. Patient is on baclofen for her low back pain and states this has not significantly changed her neck pain; she has not tried anything else. Nothing makes the pain worse but she thinks the anterior mass enlarges when she is talking. Patient denies fevers, chills, night sweats, loss of appetite, weight loss. She was in the ED for chest pain a month ago for which she received a CT chest w contrast - remarkable findings were right paratracheal lymph node (largest 1.8cm in short axis), bilateral emphysematous changes; no msk or other lesions were appreciated.   Please see problem based Assessment and Plan for status of patients chronic conditions.  Past Medical History:  Diagnosis Date  . Breast cancer ([place])   . CHF (congestive heart failure) ([place])   . COPD (chronic obstructive pulmonary disease) ([place])   . Coronary artery disease   . History of mitral valve replacement with mechanical valve   . HLD (hyperlipidemia)   . Hypertension   . Pulmonary embolism ([place])     Review of Systems:   Review of Systems  Constitutional: Negative for chills, fever, malaise/fatigue and weight loss.  Respiratory: Negative for shortness of breath.   Gastrointestinal: Negative for nausea and vomiting.  Musculoskeletal: Positive for back pain, myalgias and neck pain. Negative  for falls.  Neurological: Negative for tingling, sensory change and focal weakness.    Physical Exam:  Vitals:   02/25/17 0842  BP: (!) 144/75  Pulse: 64  Temp: 98.4 F (36.9 C)  TempSrc: Oral  SpO2: 100%  Weight: 168 lb 9.6 oz (76.5 kg)  Height: 5\' 5"  (1.651 m)   GENERAL- alert, co-operative, appears as stated age, not in any distress. HEENT- Atraumatic, normocephalic; right trapezius/supraspinatus muscle spasm with tenderness to palpation. No lymphadenopathy. CHEST - right anterior, supraclavicular ~3x2cm soft tissue mass that is mildly tender, nonmobile; left anterior chest scar of previous port placement CARDIAC- RRR, no murmurs, rubs or gallops. RESP- Moving equal volumes of air, and clear to auscultation bilaterally, no wheezes or crackles. EXT - moves all extremities freely NEURO- No obvious Cr N abnormality. SKIN- Warm, dry, no rash or lesion. PSYCH- Normal mood and affect, appropriate thought content and speech.  POC U/S of right supraclavicular mass reveals benign appearing soft tissue mass; there is no distinct fluid collection/nodule/lymph node. Mass does not appear connected to thyroid though thyroid was not adequately visualized on u/s today.  Assessment & Plan:   See Encounters Tab for problem based charting.   Patient seen with Dr. Gerrit Friends, MD Internal Medicine PGY2

## 2017-02-25 NOTE — Assessment & Plan Note (Signed)
Patient with long-time tobacco use with signs of emphysema on CT chest. She is currently smoking 1 pack per week. Patient wishes to start nicotine patches. She had a previous prescription for it before but never started it so the patches are expired.  Plan: --prescribed nicotine patch 14mg  daily x6 weeks; plan to decrease to 7mg  daily afterwards

## 2017-02-25 NOTE — Assessment & Plan Note (Signed)
Patient with symptoms and exam consistent with right trapezius/supraspinatous muscle spasm.  Plan: --refilled baclofen 10mg  daily PRN  --refilled voltaren gel to be applied to the area 3-4 times a day --advised patient to use heating pad 2-3 times per day to affected area and do light neck stretches throughout the day --RTC in 2 weeks if not improving or worsening

## 2017-02-25 NOTE — Patient Instructions (Signed)
Patient educated about medication as defined in this encounter and verbalized understanding by repeating back instructions provided.   

## 2017-02-25 NOTE — Assessment & Plan Note (Signed)
Patient with right sided, supraclavicular soft tissue mass that on u/s appears consistent with a lipoma. Due to patient's history of breast cancer, long time tobacco use, and known right paratracheal lymphadenopathy there was concern for malignancy, however u/s was not consistent with this. Also her CT chest w contrast from a month prior did not show this area to be associated with an abnormal process (though unfortunately the images are not available to view).   Plan: --will continue monitoring; advised patient to return to clinic sooner if mass is enlarging, changing in shape or consistency &/or patient develops appetite loss, nights sweats, unintentional weight loss

## 2017-02-25 NOTE — Progress Notes (Signed)
Anticoagulation Management Kim Anthony is a 66 y.o. female who reports to the clinic for monitoring of warfarin treatment.    Indication: history of PE and mechanical mitral valve Duration: indefinite Supervising physician: [facility] Visit History: Patient does not report signs/symptoms of bleeding or thromboembolism  Anticoagulation Episode Summary    Current INR goal:   2.5-3.5  TTR:   44.5 % (1.5 y)  Next INR check:   03/04/2017  INR from last check:   2.0! (02/25/2017)  Weekly max warfarin dose:     Target end date:     INR check location:   [facility]  Preferred lab:     Send INR reminders to:      Indications   Pulmonary embolism (HCC) [I26.99]       Comments:          Allergies  Allergen Reactions  . Acetaminophen-Codeine Itching  . Other Itching    Darvocet  . Tramadol Itching    vomitting   Medication Sig  baclofen (LIORESAL) 10 MG tablet TAKE 1 TABLET BY MOUTH ONCE DAILY  cetirizine (ZYRTEC) 10 MG tablet Take 1 tablet (10 mg total) by mouth daily.  diclofenac sodium (VOLTAREN) 1 % GEL Apply 2 g topically 3 (three) times daily as needed.  fluticasone (FLONASE) 50 MCG/ACT nasal spray Place 2 sprays into both nostrils daily.  furosemide (LASIX) 40 MG tablet TAKE ONE TABLET BY MOUTH ONCE DAILY  lisinopril (PRINIVIL,ZESTRIL) 10 MG tablet TAKE ONE TABLET BY MOUTH ONCE DAILY  lisinopril (PRINIVIL,ZESTRIL) 10 MG tablet Take 10 mg by mouth daily.  metoprolol tartrate (LOPRESSOR) 25 MG tablet TAKE ONE TABLET BY MOUTH TWICE DAILY  metoprolol tartrate (LOPRESSOR) 25 MG tablet Take 25 mg by mouth 2 (two) times daily.  naproxen (NAPROSYN) 500 MG tablet Take 1 tablet (500 mg total) by mouth 2 (two) times daily with a meal.  potassium chloride (KLOR-CON) 20 MEQ packet Take 20 mEq by mouth daily.  potassium chloride SA (K-DUR,KLOR-CON) 20 MEQ tablet Take 2 tablets (40 mEq total) by mouth 2 (two) times daily.  simvastatin (ZOCOR) 20 MG tablet  TAKE ONE TABLET BY MOUTH ONCE DAILY  simvastatin (ZOCOR) 20 MG tablet Take 20 mg by mouth every evening.  warfarin (COUMADIN) 4 MG tablet Take 1 tablet (4 mg total) by mouth daily. 4 mg (1 tablet) on Mondays, Wednesdays, and Fridays, 6 mg (1.5 tablets) all other days  warfarin (COUMADIN) 4 MG tablet Take 4-6 mg by mouth See admin instructions. Take 1 and 1/2 tablets on Monday, Wednesday and Friday then take 1 tablet all the other days   Past Medical History:  Diagnosis Date  . Breast cancer ([place])   . CHF (congestive heart failure) ([place])   . COPD (chronic obstructive pulmonary disease) ([place])   . Coronary artery disease   . History of mitral valve replacement with mechanical valve   . HLD (hyperlipidemia)   . Hypertension   . Pulmonary embolism [facility])    Social History   Social History  . Marital status: Single    Spouse name: N/A  . Number of children: N/A  . Years of education: N/A   Social History Main Topics  . Smoking status: Current Every Day Smoker    Packs/day: 1.00    Types: Cigarettes  . Smokeless tobacco: Not on file     Comment: .  has patches   . Alcohol use No     Comment: beer  . Drug use: No  .  Sexual activity: Not on file   Other Topics Concern  . Not on file   Social History Narrative   ** Merged History Encounter **       Family History  Problem Relation Age of Onset  . Heart attack Unknown    ASSESSMENT Recent Results: Lab Results  Component Value Date   INR 2.0 02/25/2017   INR 3.0 12/21/2016   INR 2.3 12/03/2016   Anticoagulation Dosing: INR as of 02/25/2017 and Previous Warfarin Dosing Information    INR Dt INR Goal [employer] Sun Mon Tue Wed Thu Fri Sat   02/25/2017 2.0 2.5-3.5 36 mg 6 mg 4 mg 6 mg 4 mg 6 mg 4 mg 6 mg    Anticoagulation Warfarin Dose Instructions as of 02/25/2017      Total Sun Mon Tue Wed Thu Fri Sat   New Dose 42 mg 6 mg 6 mg 6 mg 6 mg 6 mg 6 mg 6 mg     (6 mg x 1)  (6 mg x 1)  (6 mg x 1)  (6 mg x 1)  (6 mg x 1)  (6 mg  x 1)  (6 mg x 1)                           INR today: Subtherapeutic  PLAN Weekly dose was increased by 17% to 42 mg per week, initiated enoxaparin 120 mg daily due to mechanical mitral valve, follow up 1 week.  Note: change in tablet strength from 4 mg to 6 mg. Patient was educated and verbalized understanding.  Patient Instructions  Patient educated about medication as defined in this encounter and verbalized understanding by repeating back instructions provided.   Patient advised to contact clinic or seek medical attention if signs/symptoms of bleeding or thromboembolism occur.  Patient verbalized understanding by repeating back information and was advised to contact me if further medication-related questions arise. Patient was also provided an information handout.  Follow-up Return in about 1 week (around 03/04/2017).  [name]

## 2017-02-25 NOTE — Assessment & Plan Note (Signed)
Patient noted to have right sided paratracheal lymphadenopathy on CT chest w contrast in Aug 2018. Patient with h/o breast cancer s/p left mastectomy, radiation and chemotherapy in early 2000s and tobacco use disorder. She denies B symptoms.   Plan: --continue monitoring with PCP --patient has not had a mammogram in our system, please address this in future appointments

## 2017-03-01 NOTE — Progress Notes (Signed)
Internal Medicine Clinic Attending  I saw and evaluated the patient.  I personally confirmed the key portions of the history and exam documented by Dr. Svalina and I reviewed pertinent patient test results.  The assessment, diagnosis, and plan were formulated together and I agree with the documentation in the resident's note.  

## 2017-03-03 ENCOUNTER — Ambulatory Visit: Payer: Medicare Other | Admitting: Pharmacist

## 2017-03-04 ENCOUNTER — Ambulatory Visit: Payer: Medicare Other | Admitting: Pharmacist

## 2017-03-04 DIAGNOSIS — G2581 Restless legs syndrome: Secondary | ICD-10-CM | POA: Diagnosis not present

## 2017-03-04 DIAGNOSIS — M545 Low back pain: Secondary | ICD-10-CM | POA: Diagnosis not present

## 2017-03-04 NOTE — Progress Notes (Signed)
I reviewed Dr. Julianne Rice note.  Is on [facility] for mechanical heart valve.  INR low and coumadin increased.

## 2017-04-15 DIAGNOSIS — M545 Low back pain: Secondary | ICD-10-CM | POA: Diagnosis not present

## 2017-04-29 ENCOUNTER — Encounter: Payer: Medicare Other | Admitting: Internal Medicine

## 2017-05-05 ENCOUNTER — Encounter (HOSPITAL_COMMUNITY): Payer: Self-pay | Admitting: *Deleted

## 2017-05-05 ENCOUNTER — Emergency Department (HOSPITAL_COMMUNITY): Payer: Medicare Other

## 2017-05-05 ENCOUNTER — Other Ambulatory Visit: Payer: Self-pay

## 2017-05-05 ENCOUNTER — Emergency Department (HOSPITAL_COMMUNITY)
Admission: EM | Admit: 2017-05-05 | Discharge: 2017-05-05 | Disposition: A | Discharge status: Home / Self Care | Payer: Medicare Other | Attending: Emergency Medicine | Admitting: Emergency Medicine

## 2017-05-05 DIAGNOSIS — F17210 Nicotine dependence, cigarettes, uncomplicated: Secondary | ICD-10-CM | POA: Insufficient documentation

## 2017-05-05 DIAGNOSIS — I2510 Atherosclerotic heart disease of native coronary artery without angina pectoris: Secondary | ICD-10-CM | POA: Diagnosis not present

## 2017-05-05 DIAGNOSIS — I10 Essential (primary) hypertension: Secondary | ICD-10-CM | POA: Insufficient documentation

## 2017-05-05 DIAGNOSIS — M25562 Pain in left knee: Secondary | ICD-10-CM | POA: Diagnosis present

## 2017-05-05 DIAGNOSIS — Z853 Personal history of malignant neoplasm of breast: Secondary | ICD-10-CM | POA: Diagnosis not present

## 2017-05-05 DIAGNOSIS — W010XXA Fall on same level from slipping, tripping and stumbling without subsequent striking against object, initial encounter: Secondary | ICD-10-CM | POA: Diagnosis not present

## 2017-05-05 DIAGNOSIS — Y998 Other external cause status: Secondary | ICD-10-CM | POA: Diagnosis not present

## 2017-05-05 DIAGNOSIS — Z7901 Long term (current) use of anticoagulants: Secondary | ICD-10-CM | POA: Diagnosis not present

## 2017-05-05 DIAGNOSIS — E785 Hyperlipidemia, unspecified: Secondary | ICD-10-CM | POA: Insufficient documentation

## 2017-05-05 DIAGNOSIS — J449 Chronic obstructive pulmonary disease, unspecified: Secondary | ICD-10-CM | POA: Insufficient documentation

## 2017-05-05 DIAGNOSIS — Z79899 Other long term (current) drug therapy: Secondary | ICD-10-CM | POA: Diagnosis not present

## 2017-05-05 DIAGNOSIS — Z86718 Personal history of other venous thrombosis and embolism: Secondary | ICD-10-CM | POA: Insufficient documentation

## 2017-05-05 DIAGNOSIS — Y92003 Bedroom of unspecified non-institutional (private) residence as the place of occurrence of the external cause: Secondary | ICD-10-CM | POA: Insufficient documentation

## 2017-05-05 DIAGNOSIS — W19XXXA Unspecified fall, initial encounter: Secondary | ICD-10-CM

## 2017-05-05 DIAGNOSIS — S8992XA Unspecified injury of left lower leg, initial encounter: Secondary | ICD-10-CM | POA: Diagnosis not present

## 2017-05-05 DIAGNOSIS — Y9301 Activity, walking, marching and hiking: Secondary | ICD-10-CM | POA: Insufficient documentation

## 2017-05-05 MED ORDER — HYDROCODONE-ACETAMINOPHEN 5-325 MG PO TABS: 1.0000 | ORAL_TABLET | ORAL | 0 refills | Status: DC | PRN

## 2017-05-05 MED ORDER — OXYCODONE-ACETAMINOPHEN 5-325 MG PO TABS
1.0000 | ORAL_TABLET | ORAL | Status: DC | PRN
  Administered 2017-05-05: 1 via ORAL
  Filled 2017-05-05: qty 1

## 2017-05-05 MED ORDER — HYDROCODONE-ACETAMINOPHEN 5-325 MG PO TABS
2.0000 | ORAL_TABLET | Freq: Once | ORAL | Status: AC
  Administered 2017-05-05: 2 via ORAL
  Filled 2017-05-05: qty 2

## 2017-05-05 NOTE — Discharge Instructions (Signed)
Take the prescribed medication as directed.  Would try taking some NSAIDs (aleve, motrin, etc) with this to help with swelling.  Can also ice, elevate, and wear brace to help. Follow-up with your primary care doctor if any ongoing issues. Return to the ED for new or worsening symptoms.

## 2017-05-05 NOTE — ED Provider Notes (Signed)
[facility] EMERGENCY DEPARTMENT Provider Note   CSN: [id number] Arrival date & time: 05/05/17  1750     History   Chief Complaint Chief Complaint  Patient presents with  . Knee Pain    HPI Kim Anthony is a 66 y.o. female.  The history is provided by the patient and medical records.     66 y.o. F with hx of breast cancer, CHF, COPD, CAD, HLD, HTN, PE on coumadin, presenting to the ED with left knee pain.  Reports she tripped and fell last Friday (5 days ago).  States she was walking in the dark in her room and tripped.  States knee is swollen but has improved from initial onset.  Denies bleeding or open wounds.  States knee feels "stiff", worse when she first gets up.  Is able to walk, but is stopping to rest more often.  Reports left knee injury several years ago, no surgeries.  Past Medical History:  Diagnosis Date  . Breast cancer ([place])   . CHF (congestive heart failure) ([place])   . COPD (chronic obstructive pulmonary disease) ([place])   . Coronary artery disease   . History of mitral valve replacement with mechanical valve   . HLD (hyperlipidemia)   . Hypertension   . Pulmonary embolism [facility])     Patient Active Problem List   Diagnosis Date Noted  . Muscle spasm 02/25/2017  . Soft tissue mass 02/25/2017  . Tobacco use disorder 02/25/2017  . Lymphadenopathy, mediastinal 02/25/2017  . History of mitral valve replacement with mechanical valve 11/18/2016  . Essential hypertension 02/01/2016  . Allergic rhinitis 02/01/2016  . Healthcare maintenance 01/30/2016  . Chronic low back pain 07/22/2015  . Pulmonary embolism ([place]) 07/11/2015  . Chronic combined systolic and diastolic congestive heart failure ([place]) 07/11/2015  . Headache, common migraine 07/11/2015  . HLD (hyperlipidemia) 07/11/2015  . Hypokalemia 07/11/2015    Past Surgical History:  Procedure Laterality Date  . MASTECTOMY Left 2000s   for breast cancer, also s/p radiation and chemo  .  MECHANICAL AORTIC AND MITRAL VALVE REPLACEMENT      OB History    No data available       Home Medications    Prior to Admission medications   Medication Sig Start Date End Date Taking? Authorizing Provider  baclofen (LIORESAL) 10 MG tablet Take 1 tablet (10 mg total) by mouth daily. 02/25/17   Alphonzo Grieve, MD  cetirizine (ZYRTEC) 10 MG tablet Take 1 tablet (10 mg total) by mouth daily. 02/01/16   Jule Ser, DO  diclofenac sodium (VOLTAREN) 1 % GEL Apply 2 g topically 3 (three) times daily as needed. 02/25/17   Alphonzo Grieve, MD  fluticasone (FLONASE) 50 MCG/ACT nasal spray Place 2 sprays into both nostrils daily. 12/03/16 12/03/17  Jule Ser, DO  furosemide (LASIX) 40 MG tablet TAKE ONE TABLET BY MOUTH ONCE DAILY 10/05/16   Jule Ser, DO  lisinopril (PRINIVIL,ZESTRIL) 10 MG tablet TAKE ONE TABLET BY MOUTH ONCE DAILY 10/05/16   Jule Ser, DO  lisinopril (PRINIVIL,ZESTRIL) 10 MG tablet Take 10 mg by mouth daily.    [provider]  metoprolol tartrate (LOPRESSOR) 25 MG tablet TAKE ONE TABLET BY MOUTH TWICE DAILY 10/05/16   Jule Ser, DO  metoprolol tartrate (LOPRESSOR) 25 MG tablet Take 25 mg by mouth 2 (two) times daily.    [provider]  nicotine (NICODERM CQ - DOSED IN MG/24 HOURS) 14 mg/24hr patch Place 1 patch (14 mg total) onto the  skin daily. 02/25/17 02/25/18  Alphonzo Grieve, MD  potassium chloride (KLOR-CON) 20 MEQ packet Take 20 mEq by mouth daily.    [provider]  potassium chloride SA (K-DUR,KLOR-CON) 20 MEQ tablet Take 2 tablets (40 mEq total) by mouth 2 (two) times daily. 02/01/16   Jule Ser, DO  simvastatin (ZOCOR) 20 MG tablet TAKE ONE TABLET BY MOUTH ONCE DAILY 10/05/16   Jule Ser, DO  simvastatin (ZOCOR) 20 MG tablet Take 20 mg by mouth every evening.    [provider]  warfarin (COUMADIN) 6 MG tablet Take 1 tablet (6 mg total) by mouth daily. 02/25/17   Sid Falcon, MD    Family  History Family History  Problem Relation Age of Onset  . Heart attack Unknown     Social History Social History   Tobacco Use  . Smoking status: Current Every Day Smoker    Packs/day: 0.25    Types: Cigarettes  . Smokeless tobacco: Never Used  . Tobacco comment: 1 pack per week  Substance Use Topics  . Alcohol use: No    Comment: beer  . Drug use: No     Allergies   Acetaminophen-codeine; Other; and Tramadol   Review of Systems Review of Systems  Musculoskeletal: Positive for arthralgias and joint swelling.  All other systems reviewed and are negative.    Physical Exam Updated Vital Signs BP (!) 131/100 (BP Location: Right Arm)   Pulse 94   Temp 98.4 F (36.9 C) (Oral)   Resp 17   Ht 5\' 5"  (1.651 m)   Wt 77.6 kg (171 lb)   SpO2 98%   BMI 28.46 kg/m   Physical Exam  Constitutional: She is oriented to person, place, and time. She appears well-developed and well-nourished.  HENT:  Head: Normocephalic and atraumatic.  Mouth/Throat: Oropharynx is clear and moist.  Eyes: Conjunctivae and EOM are normal. Pupils are equal, round, and reactive to light.  Neck: Normal range of motion.  Cardiovascular: Normal rate, regular rhythm and normal heart sounds.  Pulmonary/Chest: Effort normal and breath sounds normal. No stridor. No respiratory distress.  Abdominal: Soft. Bowel sounds are normal. There is no tenderness. There is no rebound.  Musculoskeletal: Normal range of motion.  Left knee mildly swollen with small effusion present; is able to flex and extend, some pain when knee in full flexion; no overlying erythema, open wounds, or sores; DP pulse intact; moving toes well Hip, ankle, foot, and toes non-tender Left leg normal  Neurological: She is alert and oriented to person, place, and time.  Skin: Skin is warm and dry.  Psychiatric: She has a normal mood and affect.  Nursing note and vitals reviewed.    ED Treatments / Results  Labs (all labs ordered are  listed, but only abnormal results are displayed) Labs Reviewed - No data to display  EKG  EKG Interpretation None       Radiology Dg Knee Complete 4 Views Left  Result Date: 05/05/2017 CLINICAL DATA:  66 year old female with fall and left knee pain. EXAM: LEFT KNEE - COMPLETE 4+ VIEW COMPARISON:  None. FINDINGS: There is no acute fracture or dislocation. No significant arthritic changes. Trace suprapatellar effusion may be present. The soft tissues appear unremarkable with IMPRESSION: No acute fracture or dislocation. Electronically Signed   By: Anner Crete M.D.   On: 05/05/2017 19:54    Procedures Procedures (including critical care time)  Medications Ordered in ED Medications  oxyCODONE-acetaminophen (PERCOCET/ROXICET) 5-325 MG per tablet 1 tablet (  1 tablet Oral Given 05/05/17 1854)  HYDROcodone-acetaminophen (NORCO/VICODIN) 5-325 MG per tablet 2 tablet (2 tablets Oral Given 05/05/17 2234)     Initial Impression / Assessment and Plan / ED Course  I have reviewed the triage vital signs and the nursing notes.  Pertinent labs & imaging results that were available during my care of the patient were reviewed by me and considered in my medical decision making (see chart for details).  66 year old female here after a fall 5 days ago.  Was walking around in her bedroom in the dark when she tripped and fell landing on the left knee.  There was no head injury or loss of consciousness.  On exam she has some mild swelling in small effusion of the left knee.  Pain with full flexion of the knee, but range of motion is intact.  Leg is neurovascularly intact.  X-ray negative for acute bony findings.  Suspect contusion.  Patient placed in a knee sleeve, discussed supportive measures including ice, elevation, and anti-inflammatories.  Can follow-up with PCP for any ongoing issues.  Discussed plan with patient, she acknowledged understanding and agreed with plan of care.  Return precautions given  for new or worsening symptoms.  Final Clinical Impressions(s) / ED Diagnoses   Final diagnoses:  Fall, initial encounter  Acute pain of left knee    ED Discharge Orders    None       Larene Pickett, PA-C 05/05/17 2313    Gareth Morgan, MD 05/09/17 1745

## 2017-05-05 NOTE — ED Triage Notes (Signed)
Pt states she was walking in her room in the dark on Fri, tripped, and landed on her L knee.

## 2017-05-06 ENCOUNTER — Encounter: Payer: Medicare Other | Admitting: Internal Medicine

## 2017-05-11 ENCOUNTER — Other Ambulatory Visit: Payer: Self-pay | Admitting: Internal Medicine

## 2017-05-11 DIAGNOSIS — M62838 Other muscle spasm: Secondary | ICD-10-CM

## 2017-05-17 ENCOUNTER — Ambulatory Visit: Payer: Medicare Other

## 2017-05-27 ENCOUNTER — Ambulatory Visit (INDEPENDENT_AMBULATORY_CARE_PROVIDER_SITE_OTHER): Payer: Medicare Other | Admitting: Internal Medicine

## 2017-05-27 ENCOUNTER — Encounter: Payer: Self-pay | Admitting: Internal Medicine

## 2017-05-27 DIAGNOSIS — F17210 Nicotine dependence, cigarettes, uncomplicated: Secondary | ICD-10-CM

## 2017-05-27 DIAGNOSIS — Z952 Presence of prosthetic heart valve: Secondary | ICD-10-CM | POA: Diagnosis not present

## 2017-05-27 DIAGNOSIS — I2699 Other pulmonary embolism without acute cor pulmonale: Secondary | ICD-10-CM

## 2017-05-27 DIAGNOSIS — Z823 Family history of stroke: Secondary | ICD-10-CM | POA: Diagnosis not present

## 2017-05-27 DIAGNOSIS — E785 Hyperlipidemia, unspecified: Secondary | ICD-10-CM

## 2017-05-27 DIAGNOSIS — Z853 Personal history of malignant neoplasm of breast: Secondary | ICD-10-CM | POA: Insufficient documentation

## 2017-05-27 DIAGNOSIS — Z86711 Personal history of pulmonary embolism: Secondary | ICD-10-CM

## 2017-05-27 DIAGNOSIS — Z23 Encounter for immunization: Secondary | ICD-10-CM

## 2017-05-27 DIAGNOSIS — Z79899 Other long term (current) drug therapy: Secondary | ICD-10-CM | POA: Diagnosis not present

## 2017-05-27 DIAGNOSIS — R791 Abnormal coagulation profile: Secondary | ICD-10-CM | POA: Diagnosis not present

## 2017-05-27 DIAGNOSIS — Z9221 Personal history of antineoplastic chemotherapy: Secondary | ICD-10-CM | POA: Diagnosis not present

## 2017-05-27 DIAGNOSIS — I5042 Chronic combined systolic (congestive) and diastolic (congestive) heart failure: Secondary | ICD-10-CM | POA: Diagnosis not present

## 2017-05-27 DIAGNOSIS — Z9012 Acquired absence of left breast and nipple: Secondary | ICD-10-CM | POA: Diagnosis not present

## 2017-05-27 DIAGNOSIS — Z7901 Long term (current) use of anticoagulants: Secondary | ICD-10-CM

## 2017-05-27 LAB — POCT INR: INR: 6.2

## 2017-05-27 MED ORDER — CETIRIZINE HCL 10 MG PO TABS: 10.0000 mg | ORAL_TABLET | Freq: Every day | ORAL | 1 refills | Status: DC

## 2017-05-27 MED ORDER — LISINOPRIL 10 MG PO TABS: 10.0000 mg | ORAL_TABLET | Freq: Every day | ORAL | 1 refills | Status: DC

## 2017-05-27 MED ORDER — METOPROLOL SUCCINATE ER 25 MG PO TB24: 12.5000 mg | ORAL_TABLET | Freq: Every day | ORAL | 1 refills | Status: DC

## 2017-05-27 MED ORDER — SIMVASTATIN 20 MG PO TABS: 20.0000 mg | ORAL_TABLET | Freq: Every day | ORAL | 1 refills | Status: DC

## 2017-05-27 MED ORDER — POTASSIUM CHLORIDE CRYS ER 20 MEQ PO TBCR: 40.0000 meq | EXTENDED_RELEASE_TABLET | Freq: Every day | ORAL | 1 refills | Status: DC

## 2017-05-27 MED ORDER — FUROSEMIDE 40 MG PO TABS: 40.0000 mg | ORAL_TABLET | Freq: Every day | ORAL | 1 refills | Status: DC

## 2017-05-27 NOTE — Assessment & Plan Note (Signed)
Assessment: INR is supratherapeutic above 6 today.  She has been inconsistent with INR checks and her last was in September.  She has history of PE but also a mechanical MV.  Her care was coordinated with our pharmacist Dr. Maudie Mercury who helped to make changes to her Warfarin dosing.  No symptoms of bleeding reported by patient.  No complaints of chest pain or SOB.  Plan: - Continue Warfarin monitoring with assistance of our clinic pharmacist.  Patient was asked to follow up later this week for repeat INR monitoring

## 2017-05-27 NOTE — Progress Notes (Signed)
error 

## 2017-05-27 NOTE — Assessment & Plan Note (Signed)
Assessment: Hyperlipidemia  Plan: - refill of her simvastatin

## 2017-05-27 NOTE — Assessment & Plan Note (Signed)
>>  ASSESSMENT AND PLAN FOR HFREF (HEART FAILURE WITH REDUCED EJECTION FRACTION) (HCC) WRITTEN ON 09/14/2023 11:44 AM BY Sajjad Honea, DO   >>ASSESSMENT AND PLAN FOR CHRONIC HFREF (HEART FAILURE WITH REDUCED EJECTION FRACTION) (HCC) WRITTEN ON 09/14/2023 11:43 AM BY Hartleigh Edmonston, DO   >>ASSESSMENT AND PLAN FOR CHF (CONGESTIVE HEART FAILURE) (HCC) WRITTEN ON 09/14/2023 11:43 AM BY Ayaat Jansma, DO   >>ASSESSMENT AND PLAN FOR BIVENTRICULAR HEART FAILURE WITH REDUCED LEFT VENTRICULAR FUNCTION (HCC) WRITTEN ON 05/27/2017  4:28 PM BY WALLACE, ANDREW, DO  Assessment:  She appears euvolemic on exam today.  Asking for refills of all her medications.  She has not been taking her metoprolol.  BP is stable at 119/84, HR 91.  Plan: - refilled lisinopril and lasix at current doses - restarted her metoprolol XL at low dose of 12.5mg  daily

## 2017-05-27 NOTE — Patient Instructions (Signed)
FOLLOW-UP INSTRUCTIONS When: 6 months For: Routine check up What to bring: your current medication list

## 2017-05-27 NOTE — Assessment & Plan Note (Signed)
>>  ASSESSMENT AND PLAN FOR CHF (CONGESTIVE HEART FAILURE) (HCC) WRITTEN ON 09/14/2023 11:43 AM BY Taleia Sadowski, DO   >>ASSESSMENT AND PLAN FOR BIVENTRICULAR HEART FAILURE WITH REDUCED LEFT VENTRICULAR FUNCTION (HCC) WRITTEN ON 05/27/2017  4:28 PM BY WALLACE, ANDREW, DO  Assessment:  She appears euvolemic on exam today.  Asking for refills of all her medications.  She has not been taking her metoprolol.  BP is stable at 119/84, HR 91.  Plan: - refilled lisinopril and lasix at current doses - restarted her metoprolol XL at low dose of 12.5mg  daily

## 2017-05-27 NOTE — Progress Notes (Signed)
   CC: here for f/u of CHF, INR check, HLD  HPI:  [name] is a 66 y.o. woman with a past medical history listed below here today for follow up of her CHF, HLD, INR check.  For details of today's visit and the status of her chronic medical issues please refer to the assessment and plan.   Past Medical History:  Diagnosis Date  . Breast cancer ([place] Anthony)   . CHF (congestive heart failure) ([place])   . COPD (chronic obstructive pulmonary disease) ([place])   . Coronary artery disease   . History of mitral valve replacement with mechanical valve   . HLD (hyperlipidemia)   . Hypertension   . Pulmonary embolism ([place])    Review of Systems:  Please see pertinent ROS reviewed in HPI and problem based charting.   Physical Exam:  Vitals:   05/27/17 1438 05/27/17 1440  BP: 119/84   Pulse: 91   Temp: 98.5 F (36.9 C)   TempSrc: Oral   SpO2:  98%  Weight: 166 lb (75.3 kg)   Height:  5' 5.5" (1.664 m)   General: sitting up in chair, NAD HEENT: NCAT, EOMI, no scleral icterus Pulm: normal effort Ext: warm and well perfused, no pedal edema Neuro: alert and oriented X3, cranial nerves II-XII grossly intact   Assessment & Plan:   See Encounters Tab for problem based charting.  Patient discussed with Dr. Beryle Beams.  Pulmonary embolism (HCC) Assessment: INR is supratherapeutic above 6 today.  She has been inconsistent with INR checks and her last was in September.  She has history of PE but also a mechanical MV.  Her care was coordinated with our pharmacist Dr. Maudie Mercury who helped to make changes to her Warfarin dosing.  No symptoms of bleeding reported by patient.  No complaints of chest pain or SOB.  Plan: - Continue Warfarin monitoring with assistance of our clinic pharmacist.  Patient was asked to follow up later this week for repeat INR monitoring  Chronic combined systolic and diastolic congestive heart failure ([place]) Assessment:  She appears euvolemic on exam today.  Asking for  refills of all her medications.  She has not been taking her metoprolol.  BP is stable at 119/84, HR 91.  Plan: - refilled lisinopril and lasix at current doses - restarted her metoprolol XL at low dose of 12.5mg  daily  Hx of breast cancer Assessment: Mammogram ordered today to update our health maintenance.  She has hx of left sided breast cancer treated with mastectomy, chemo and radiation in 2001.  She does not currently have any concerns or symptoms.  Plan: - F/u mammogram  HLD (hyperlipidemia) Assessment: Hyperlipidemia  Plan: - refill of her simvastatin

## 2017-05-27 NOTE — Assessment & Plan Note (Signed)
>>  ASSESSMENT AND PLAN FOR CHRONIC HFREF (HEART FAILURE WITH REDUCED EJECTION FRACTION) (HCC) WRITTEN ON 09/14/2023 11:43 AM BY Kelci Petrella, DO   >>ASSESSMENT AND PLAN FOR CHF (CONGESTIVE HEART FAILURE) (HCC) WRITTEN ON 09/14/2023 11:43 AM BY Tejuan Gholson, DO   >>ASSESSMENT AND PLAN FOR BIVENTRICULAR HEART FAILURE WITH REDUCED LEFT VENTRICULAR FUNCTION (HCC) WRITTEN ON 05/27/2017  4:28 PM BY WALLACE, ANDREW, DO  Assessment:  She appears euvolemic on exam today.  Asking for refills of all her medications.  She has not been taking her metoprolol.  BP is stable at 119/84, HR 91.  Plan: - refilled lisinopril and lasix at current doses - restarted her metoprolol XL at low dose of 12.5mg  daily

## 2017-05-27 NOTE — Assessment & Plan Note (Signed)
>>  ASSESSMENT AND PLAN FOR BIVENTRICULAR HEART FAILURE WITH REDUCED LEFT VENTRICULAR FUNCTION (HCC) WRITTEN ON 05/27/2017  4:28 PM BY WALLACE, ANDREW, DO  Assessment:  She appears euvolemic on exam today.  Asking for refills of all her medications.  She has not been taking her metoprolol.  BP is stable at 119/84, HR 91.  Plan: - refilled lisinopril and lasix at current doses - restarted her metoprolol XL at low dose of 12.5mg  daily

## 2017-05-27 NOTE — Progress Notes (Signed)
Anticoagulation Management Kim Anthony a 66 y.o.femalewho reports to the clinic for monitoring of warfarintreatment.   Indication: history of PE and mechanical mitral valve Duration: indefinite Supervising physician: Murriel Hopper and Jule Ser  Anticoagulation Clinic Visit History: Patient does notreport signs/symptoms of bleeding or thromboembolism. Of note, patient was due for PT/INR warfarin monitoring since September 2018 (3 months ago) but never showed.  Anticoagulation Episode Summary    Current INR goal:   2.5-3.5  TTR:   41.6 % (1.8 y)  Next INR check:   06/03/2017  INR from last check:   6.2! (05/27/2017)  Weekly max warfarin dose:     Target end date:     INR check location:   [facility]  Preferred lab:     Send INR reminders to:      Indications   Pulmonary embolism (HCC) [I26.99]       Comments:           Allergies  Allergen Reactions  . Acetaminophen-Codeine Itching  . Other Itching    Darvocet  . Tramadol Itching    vomitting   Medication Sig  cetirizine (ZYRTEC) 10 MG tablet Take 1 tablet (10 mg total) by mouth daily.  diclofenac sodium (VOLTAREN) 1 % GEL Apply 2 g topically 3 (three) times daily as needed.  fluticasone (FLONASE) 50 MCG/ACT nasal spray Place 2 sprays into both nostrils daily.  furosemide (LASIX) 40 MG tablet Take 1 tablet (40 mg total) by mouth daily.  lisinopril (PRINIVIL,ZESTRIL) 10 MG tablet Take 1 tablet (10 mg total) by mouth daily.  metoprolol succinate (TOPROL XL) 25 MG 24 hr tablet Take 0.5 tablets (12.5 mg total) by mouth daily.  nicotine (NICODERM CQ - DOSED IN MG/24 HOURS) 14 mg/24hr patch Place 1 patch (14 mg total) onto the skin daily.  potassium chloride SA (K-DUR,KLOR-CON) 20 MEQ tablet Take 2 tablets (40 mEq total) by mouth daily.  simvastatin (ZOCOR) 20 MG tablet Take 1 tablet (20 mg total) by mouth daily.  warfarin (COUMADIN) 6 MG tablet Take 1 tablet (6 mg total) by mouth daily.   Past Medical  History:  Diagnosis Date  . Breast cancer ([place])   . CHF (congestive heart failure) ([place])   . COPD (chronic obstructive pulmonary disease) ([place])   . Coronary artery disease   . History of mitral valve replacement with mechanical valve   . HLD (hyperlipidemia)   . Hypertension   . Pulmonary embolism [facility])    Social History   Socioeconomic History  . Marital status: Single    Spouse name: None  . Number of children: None  . Years of education: None  . Highest education level: None  Social Needs  . Financial resource strain: None  . Food insecurity - worry: None  . Food insecurity - inability: None  . Transportation needs - medical: None  . Transportation needs - non-medical: None  Occupational History  . None  Tobacco Use  . Smoking status: Current Every Day Smoker    Packs/day: 0.10    Types: Cigarettes  . Smokeless tobacco: Never Used  . Tobacco comment: 1 pack per week  Substance and Sexual Activity  . Alcohol use: No    Comment: beer  . Drug use: No  . Sexual activity: None  Other Topics Concern  . None  Social History Narrative   ** Merged History Encounter **       Family History  Problem Relation Age of Onset  . Heart attack Unknown  ASSESSMENT Recent Results: Lab Results  Component Value Date   INR 6.2 05/27/2017   INR 2.0 02/25/2017   INR 3.0 12/21/2016   Anticoagulation Dosing: 1 tablet (6 mg) daily   INR today: Supratherapeutic  PLAN Hold x 3 days, then resume 1 tablet daily. I advised patient to follow up tomorrow or early next week, however, patient and daughter state they are unable to follow up tomorrow (Friday) or earlier next week. The earliest possible is Thursday. Advised patient to monitor for signs of bleeding and to seek medical attention if needed.  Patient advised to contact clinic or seek medical attention if signs/symptoms of bleeding or thromboembolism occur.  Patient verbalized understanding by repeating back information and  was advised to contact me if further medication-related questions arise. Patient was also provided an information handout.  Follow-up Return in about 7 days (around 06/03/2017).

## 2017-05-27 NOTE — Assessment & Plan Note (Signed)
Assessment:  She appears euvolemic on exam today.  Asking for refills of all her medications.  She has not been taking her metoprolol.  BP is stable at 119/84, HR 91.  Plan: - refilled lisinopril and lasix at current doses - restarted her metoprolol XL at low dose of 12.5mg  daily

## 2017-05-27 NOTE — Assessment & Plan Note (Signed)
Assessment: Mammogram ordered today to update our health maintenance.  She has hx of left sided breast cancer treated with mastectomy, chemo and radiation in 2001.  She does not currently have any concerns or symptoms.  Plan: - F/u mammogram

## 2017-05-28 NOTE — Progress Notes (Signed)
Medicine attending: Medical history, presenting problems, physical findings, and medications, reviewed with resident physician Dr Andrew Wallace on the day of the patient visit and I concur with his evaluation and management plan. 

## 2017-06-10 DIAGNOSIS — M545 Low back pain: Secondary | ICD-10-CM | POA: Diagnosis not present

## 2017-06-10 DIAGNOSIS — M546 Pain in thoracic spine: Secondary | ICD-10-CM | POA: Diagnosis not present

## 2017-06-10 DIAGNOSIS — Z79899 Other long term (current) drug therapy: Secondary | ICD-10-CM | POA: Diagnosis not present

## 2017-06-10 DIAGNOSIS — M542 Cervicalgia: Secondary | ICD-10-CM | POA: Diagnosis not present

## 2017-06-10 DIAGNOSIS — G2581 Restless legs syndrome: Secondary | ICD-10-CM | POA: Diagnosis not present

## 2017-06-19 ENCOUNTER — Other Ambulatory Visit: Payer: Self-pay | Admitting: Internal Medicine

## 2017-06-19 DIAGNOSIS — Z1231 Encounter for screening mammogram for malignant neoplasm of breast: Secondary | ICD-10-CM

## 2017-06-22 ENCOUNTER — Emergency Department (HOSPITAL_COMMUNITY)
Admission: EM | Admit: 2017-06-22 | Discharge: 2017-06-22 | Disposition: A | Discharge status: Home / Self Care | Payer: Medicare Other | Attending: Emergency Medicine | Admitting: Emergency Medicine

## 2017-06-22 ENCOUNTER — Other Ambulatory Visit: Payer: Self-pay

## 2017-06-22 ENCOUNTER — Encounter (HOSPITAL_COMMUNITY): Payer: Self-pay

## 2017-06-22 DIAGNOSIS — Z952 Presence of prosthetic heart valve: Secondary | ICD-10-CM | POA: Insufficient documentation

## 2017-06-22 DIAGNOSIS — F17210 Nicotine dependence, cigarettes, uncomplicated: Secondary | ICD-10-CM | POA: Insufficient documentation

## 2017-06-22 DIAGNOSIS — J449 Chronic obstructive pulmonary disease, unspecified: Secondary | ICD-10-CM | POA: Insufficient documentation

## 2017-06-22 DIAGNOSIS — R0981 Nasal congestion: Secondary | ICD-10-CM | POA: Diagnosis not present

## 2017-06-22 DIAGNOSIS — I5042 Chronic combined systolic (congestive) and diastolic (congestive) heart failure: Secondary | ICD-10-CM | POA: Diagnosis not present

## 2017-06-22 DIAGNOSIS — Z7901 Long term (current) use of anticoagulants: Secondary | ICD-10-CM | POA: Insufficient documentation

## 2017-06-22 DIAGNOSIS — I110 Hypertensive heart disease with heart failure: Secondary | ICD-10-CM | POA: Insufficient documentation

## 2017-06-22 DIAGNOSIS — J029 Acute pharyngitis, unspecified: Secondary | ICD-10-CM

## 2017-06-22 DIAGNOSIS — I2510 Atherosclerotic heart disease of native coronary artery without angina pectoris: Secondary | ICD-10-CM | POA: Insufficient documentation

## 2017-06-22 DIAGNOSIS — Z79899 Other long term (current) drug therapy: Secondary | ICD-10-CM | POA: Insufficient documentation

## 2017-06-22 DIAGNOSIS — J028 Acute pharyngitis due to other specified organisms: Secondary | ICD-10-CM | POA: Diagnosis not present

## 2017-06-22 DIAGNOSIS — Z853 Personal history of malignant neoplasm of breast: Secondary | ICD-10-CM | POA: Diagnosis not present

## 2017-06-22 DIAGNOSIS — R0982 Postnasal drip: Secondary | ICD-10-CM | POA: Diagnosis not present

## 2017-06-22 DIAGNOSIS — J3489 Other specified disorders of nose and nasal sinuses: Secondary | ICD-10-CM | POA: Diagnosis not present

## 2017-06-22 LAB — RAPID STREP SCREEN (MED CTR MEBANE ONLY): STREPTOCOCCUS, GROUP A SCREEN (DIRECT): NEGATIVE

## 2017-06-22 MED ORDER — PREDNISONE 20 MG PO TABS: 40.0000 mg | ORAL_TABLET | Freq: Every day | ORAL | 0 refills | Status: DC

## 2017-06-22 MED ORDER — CETIRIZINE HCL 10 MG PO CAPS: 10.0000 mg | ORAL_CAPSULE | Freq: Every day | ORAL | 0 refills | Status: DC

## 2017-06-22 MED ORDER — LIDOCAINE VISCOUS 2 % MT SOLN: 15.0000 mL | OROMUCOSAL | 0 refills | Status: DC | PRN

## 2017-06-22 MED ORDER — ACETAMINOPHEN 325 MG PO TABS
650.0000 mg | ORAL_TABLET | Freq: Once | ORAL | Status: AC | PRN
  Administered 2017-06-22: 650 mg via ORAL
  Filled 2017-06-22: qty 2

## 2017-06-22 NOTE — ED Triage Notes (Signed)
Pt presents for evaluation of sore throat and congestion x 2 days. Pt has hoarse voice.

## 2017-06-22 NOTE — Discharge Instructions (Signed)
You do not have strep throat.   Your sore throat viral and related to post-nasal drip. Please take tylenol as needed for throat pain.   Schedule appointment to follow-up with your primary care doctor for recheck in a week.  Please also drink plenty of fluids including warm liquids to help relieve your throat pain. You can also use over the counter cough drops.   I have written you a prescription for a liquid swish and spit medicine to help relieve your pain. This medicine helps numb the back of the throat.   I also wrote you a prescription for zyrtec.  Take this for postnasal drip.  Your blood pressure was also mildly elevated in the ER today, please have this rechecked at your primary care office.  Return to the emergency department if you have fever greater than 100.4 F with throat pain, have difficulties swallowing or breathing because of throat pain or have any new or worsening symptoms.

## 2017-06-22 NOTE — ED Provider Notes (Signed)
[facility] EMERGENCY DEPARTMENT Provider Note   CSN: [id number] Arrival date & time: 06/22/17  1228     History   Chief Complaint Chief Complaint  Patient presents with  . Sore Throat    HPI Kim Anthony is a 67 y.o. female.  HPI   Kim Anthony is a 67 year old female with a history of systolic heart failure (EF 45%), hypertension, hyperlipidemia who presents to the emergency department for evaluation of sore throat.  Patient states that the sore throat began 2 days ago.  Pain is 8/10 in severity, constant and "sharp" in nature.  Worsened with swallowing.  Improved with warm liquids.  She has tried Tylenol with minimal relief.  Also reports some nasal congestion with post-nasal drip and tender lymphadenopathy.  She denies fever, chills, trismus, dysphasia, ear pain, cough, chest pain, abdominal pain, nausea/vomiting.  Denies sick contacts at home.  Past Medical History:  Diagnosis Date  . Breast cancer ([place])   . CHF (congestive heart failure) ([place])   . COPD (chronic obstructive pulmonary disease) ([place])   . Coronary artery disease   . History of mitral valve replacement with mechanical valve   . HLD (hyperlipidemia)   . Hypertension   . Pulmonary embolism [facility])     Patient Active Problem List   Diagnosis Date Noted  . Hx of breast cancer 05/27/2017  . Muscle spasm 02/25/2017  . Soft tissue mass 02/25/2017  . Tobacco use disorder 02/25/2017  . Lymphadenopathy, mediastinal 02/25/2017  . History of mitral valve replacement with mechanical valve 11/18/2016  . Essential hypertension 02/01/2016  . Allergic rhinitis 02/01/2016  . Healthcare maintenance 01/30/2016  . Chronic low back pain 07/22/2015  . Pulmonary embolism ([place]) 07/11/2015  . Chronic combined systolic and diastolic congestive heart failure ([place]) 07/11/2015  . Headache, common migraine 07/11/2015  . HLD (hyperlipidemia) 07/11/2015  . Hypokalemia 07/11/2015    Past Surgical History:  Procedure  Laterality Date  . MASTECTOMY Left 2000s   for breast cancer, also s/p radiation and chemo  . MECHANICAL AORTIC AND MITRAL VALVE REPLACEMENT      OB History    No data available       Home Medications    Prior to Admission medications   Medication Sig Start Date End Date Taking? Authorizing Provider  cetirizine (ZYRTEC) 10 MG tablet Take 1 tablet (10 mg total) by mouth daily. 05/27/17   Jule Ser, DO  diclofenac sodium (VOLTAREN) 1 % GEL Apply 2 g topically 3 (three) times daily as needed. 02/25/17   Alphonzo Grieve, MD  fluticasone (FLONASE) 50 MCG/ACT nasal spray Place 2 sprays into both nostrils daily. 12/03/16 12/03/17  Jule Ser, DO  furosemide (LASIX) 40 MG tablet Take 1 tablet (40 mg total) by mouth daily. 05/27/17   Jule Ser, DO  lisinopril (PRINIVIL,ZESTRIL) 10 MG tablet Take 1 tablet (10 mg total) by mouth daily. 05/27/17   Jule Ser, DO  metoprolol succinate (TOPROL XL) 25 MG 24 hr tablet Take 0.5 tablets (12.5 mg total) by mouth daily. 05/27/17   Jule Ser, DO  nicotine (NICODERM CQ - DOSED IN MG/24 HOURS) 14 mg/24hr patch Place 1 patch (14 mg total) onto the skin daily. 02/25/17 02/25/18  Alphonzo Grieve, MD  potassium chloride SA (K-DUR,KLOR-CON) 20 MEQ tablet Take 2 tablets (40 mEq total) by mouth daily. 05/27/17   Jule Ser, DO  simvastatin (ZOCOR) 20 MG tablet Take 1 tablet (20 mg total) by mouth daily. 05/27/17   Jule Ser, DO  warfarin (  COUMADIN) 6 MG tablet Take 1 tablet (6 mg total) by mouth daily. 02/25/17   Sid Falcon, MD    Family History Family History  Problem Relation Age of Onset  . Heart attack Unknown     Social History Social History   Tobacco Use  . Smoking status: Current Every Day Smoker    Packs/day: 0.10    Types: Cigarettes  . Smokeless tobacco: Never Used  . Tobacco comment: 1 pack per week  Substance Use Topics  . Alcohol use: No    Comment: beer  . Drug use: No     Allergies     Acetaminophen-codeine; Other; and Tramadol   Review of Systems Review of Systems  Constitutional: Negative for chills, fatigue and fever.  HENT: Positive for congestion, postnasal drip, rhinorrhea and sore throat. Negative for ear pain and trouble swallowing.   Respiratory: Negative for cough and shortness of breath.   Cardiovascular: Negative for chest pain.  Gastrointestinal: Negative for abdominal pain, diarrhea, nausea and vomiting.  Musculoskeletal: Negative for myalgias.  Neurological: Negative for headaches.     Physical Exam Updated Vital Signs BP (!) 161/91 (BP Location: Right Arm)   Pulse 95   Temp 98.5 F (36.9 C) (Oral)   Resp 18   SpO2 95%   Physical Exam  Constitutional: She appears well-developed and well-nourished.  Non-toxic appearance. She does not appear ill. No distress.  Posterior oropharynx mildly erythematous. No tonsillar swelling or exudate. Uvula midline. Airway patent. Able to handle oral secretions.   HENT:  Head: Normocephalic and atraumatic.  Right Ear: Tympanic membrane and ear canal normal. No tenderness.  Left Ear: Tympanic membrane and ear canal normal. No tenderness.  Mouth/Throat: Mucous membranes are normal.  Posterior nasal drip  Eyes: Pupils are equal, round, and reactive to light. Right eye exhibits no discharge. Left eye exhibits no discharge.  Neck: Normal range of motion. Neck supple.  Tender anterior chain cervical lymphadenopathy.  Cardiovascular: Normal rate and regular rhythm.  Pulmonary/Chest: Effort normal and breath sounds normal. No stridor. No respiratory distress. She has no wheezes. She has no rhonchi. She has no rales.  Neurological: She is alert. Coordination normal.  Skin: Skin is warm and dry. Capillary refill takes less than 2 seconds. She is not diaphoretic.  Psychiatric: She has a normal mood and affect. Her behavior is normal.  Nursing note and vitals reviewed.    ED Treatments / Results  Labs (all labs  ordered are listed, but only abnormal results are displayed) Labs Reviewed  RAPID STREP SCREEN (NOT AT [facility])  CULTURE, GROUP A STREP [facility])    EKG  EKG Interpretation None       Radiology No results found.  Procedures Procedures (including critical care time)  Medications Ordered in ED Medications  acetaminophen (TYLENOL) tablet 650 mg (650 mg Oral Given 06/22/17 1256)     Initial Impression / Assessment and Plan / ED Course  I have reviewed the triage vital signs and the nursing notes.  Pertinent labs & imaging results that were available during my care of the patient were reviewed by me and considered in my medical decision making (see chart for details).    Pt afebrile without tonsillar exudate, negative strep. Presents with mild cervical lymphadenopathy, & odynophagia; diagnosis of viral pharyngitis. She also has post-nasal drip which is likely irritating the back of her throat. No abx indicated. Discharged with symptomatic tx for pain. Pt does not appear dehydrated, but did discuss importance of water  rehydration. Presentation non concerning for PTA or RPA. No trismus or uvula deviation. Specific return precautions discussed. Pt able to drink water in ED without difficulty with intact air way. Recommended PCP follow up.  Her blood pressure was elevated in the ER today, counseled her to have this rechecked and patient agrees and voiced understanding to the above plan.  Final Clinical Impressions(s) / ED Diagnoses   Final diagnoses:  Viral pharyngitis     Glyn Ade, PA-C 06/22/17 1716    Fatima Blank, MD 06/23/17 [phone]

## 2017-06-24 LAB — CULTURE, GROUP A STREP (THRC)

## 2017-07-01 ENCOUNTER — Ambulatory Visit: Payer: Medicare Other | Admitting: Pharmacist

## 2017-07-12 ENCOUNTER — Ambulatory Visit: Payer: Medicare Other

## 2017-07-12 ENCOUNTER — Ambulatory Visit
Admission: RE | Admit: 2017-07-12 | Discharge: 2017-07-12 | Disposition: A | Discharge status: Home / Self Care | Payer: Medicare Other | Source: Ambulatory Visit | Attending: Internal Medicine | Admitting: Internal Medicine

## 2017-07-12 DIAGNOSIS — Z1231 Encounter for screening mammogram for malignant neoplasm of breast: Secondary | ICD-10-CM | POA: Diagnosis not present

## 2017-07-14 ENCOUNTER — Ambulatory Visit: Payer: Medicare Other | Admitting: Pharmacist

## 2017-08-10 ENCOUNTER — Encounter (HOSPITAL_COMMUNITY): Payer: Self-pay | Admitting: Emergency Medicine

## 2017-08-10 ENCOUNTER — Emergency Department (HOSPITAL_COMMUNITY): Payer: Medicare Other

## 2017-08-10 ENCOUNTER — Emergency Department (HOSPITAL_COMMUNITY)
Admission: EM | Admit: 2017-08-10 | Discharge: 2017-08-10 | Disposition: A | Discharge status: Home / Self Care | Payer: Medicare Other | Attending: Emergency Medicine | Admitting: Emergency Medicine

## 2017-08-10 DIAGNOSIS — Y92013 Bedroom of single-family (private) house as the place of occurrence of the external cause: Secondary | ICD-10-CM | POA: Insufficient documentation

## 2017-08-10 DIAGNOSIS — Y9389 Activity, other specified: Secondary | ICD-10-CM | POA: Diagnosis not present

## 2017-08-10 DIAGNOSIS — F17210 Nicotine dependence, cigarettes, uncomplicated: Secondary | ICD-10-CM | POA: Insufficient documentation

## 2017-08-10 DIAGNOSIS — Y999 Unspecified external cause status: Secondary | ICD-10-CM | POA: Insufficient documentation

## 2017-08-10 DIAGNOSIS — W06XXXA Fall from bed, initial encounter: Secondary | ICD-10-CM | POA: Diagnosis not present

## 2017-08-10 DIAGNOSIS — M25562 Pain in left knee: Secondary | ICD-10-CM | POA: Diagnosis not present

## 2017-08-10 DIAGNOSIS — Z79899 Other long term (current) drug therapy: Secondary | ICD-10-CM | POA: Diagnosis not present

## 2017-08-10 DIAGNOSIS — Z7901 Long term (current) use of anticoagulants: Secondary | ICD-10-CM | POA: Diagnosis not present

## 2017-08-10 DIAGNOSIS — I110 Hypertensive heart disease with heart failure: Secondary | ICD-10-CM | POA: Insufficient documentation

## 2017-08-10 DIAGNOSIS — I5042 Chronic combined systolic (congestive) and diastolic (congestive) heart failure: Secondary | ICD-10-CM | POA: Insufficient documentation

## 2017-08-10 DIAGNOSIS — S20101A Unspecified superficial injuries of breast, right breast, initial encounter: Secondary | ICD-10-CM | POA: Diagnosis not present

## 2017-08-10 DIAGNOSIS — J449 Chronic obstructive pulmonary disease, unspecified: Secondary | ICD-10-CM | POA: Diagnosis not present

## 2017-08-10 DIAGNOSIS — S2001XA Contusion of right breast, initial encounter: Secondary | ICD-10-CM | POA: Insufficient documentation

## 2017-08-10 DIAGNOSIS — M25561 Pain in right knee: Secondary | ICD-10-CM | POA: Diagnosis not present

## 2017-08-10 DIAGNOSIS — S299XXA Unspecified injury of thorax, initial encounter: Secondary | ICD-10-CM | POA: Diagnosis not present

## 2017-08-10 DIAGNOSIS — S8992XA Unspecified injury of left lower leg, initial encounter: Secondary | ICD-10-CM | POA: Diagnosis not present

## 2017-08-10 DIAGNOSIS — S0990XA Unspecified injury of head, initial encounter: Secondary | ICD-10-CM | POA: Diagnosis not present

## 2017-08-10 NOTE — Discharge Instructions (Signed)
You have a bruise of your right breast. No signs of bony injury or internal bleed today on x-rays and CT scans. Ice. Take tylenol for pain. Follow up with primary care doctor in 1 week if pain persists. Continue with routine mammograms as recommended by your primary care doctor. Return to emergency department if you notice worsening swelling or bruising, redness, warmth, discharge from nipple, fevers.

## 2017-08-10 NOTE — ED Triage Notes (Signed)
Pt reports falling off of bed a few days ago while she was sleeping. Presents with bilateral knee pain, and bruising/pain to R breast.

## 2017-08-10 NOTE — ED Notes (Signed)
Patient transported to CT 

## 2017-08-10 NOTE — ED Provider Notes (Signed)
[facility] EMERGENCY DEPARTMENT Provider Note   CSN: [id number] Arrival date & time: 08/10/17  1804     History   Chief Complaint Chief Complaint  Patient presents with  . Fall    HPI Kim Anthony is a 67 y.o. female with history of pulmonary embolism, on warfarin, chronic systolic and diastolic congestive heart failure, history of left breast cancer status post mastectomy, chemotherapy and radiation, hyperlipidemia, mitral valve replacement is here for evaluation of right breast bruising and tenderness after fall 6 days ago. Patient states that she rolled over in bed and fell off the side hitting her right breast. She is not exactly sure how she landed but is endorsing pain to the right lower breast and bilateral knees since the fall. States that she also hit the left posterior aspect of her head somehow during this fall, she had a bump to this area that she treated with ice and it eventually resolved. She denies any headache, dizziness, vision changes, nausea, vomiting, numbness or weakness unilaterally, difficulty with speech or walking. Right breast tenderness is described as sharp, is worse with palpation and movement of her arm, better with rest.  HPI  Past Medical History:  Diagnosis Date  . Breast cancer ([place])   . CHF (congestive heart failure) ([place])   . COPD (chronic obstructive pulmonary disease) ([place])   . Coronary artery disease   . History of mitral valve replacement with mechanical valve   . HLD (hyperlipidemia)   . Hypertension   . Pulmonary embolism [facility])     Patient Active Problem List   Diagnosis Date Noted  . Hx of breast cancer 05/27/2017  . Muscle spasm 02/25/2017  . Soft tissue mass 02/25/2017  . Tobacco use disorder 02/25/2017  . Lymphadenopathy, mediastinal 02/25/2017  . History of mitral valve replacement with mechanical valve 11/18/2016  . Essential hypertension 02/01/2016  . Allergic rhinitis 02/01/2016  . Healthcare maintenance  01/30/2016  . Chronic low back pain 07/22/2015  . Pulmonary embolism ([place]) 07/11/2015  . Chronic combined systolic and diastolic congestive heart failure ([place]) 07/11/2015  . Headache, common migraine 07/11/2015  . HLD (hyperlipidemia) 07/11/2015  . Hypokalemia 07/11/2015    Past Surgical History:  Procedure Laterality Date  . MASTECTOMY Left 2000s   for breast cancer, also s/p radiation and chemo  . MECHANICAL AORTIC AND MITRAL VALVE REPLACEMENT      OB History    No data available       Home Medications    Prior to Admission medications   Medication Sig Start Date End Date Taking? Authorizing Provider  cetirizine (ZYRTEC) 10 MG tablet Take 1 tablet (10 mg total) by mouth daily. 05/27/17   Jule Ser, DO  Cetirizine HCl (ZYRTEC ALLERGY) 10 MG CAPS Take 1 capsule (10 mg total) by mouth daily. 06/22/17   Glyn Ade, PA-C  diclofenac sodium (VOLTAREN) 1 % GEL Apply 2 g topically 3 (three) times daily as needed. 02/25/17   Alphonzo Grieve, MD  fluticasone (FLONASE) 50 MCG/ACT nasal spray Place 2 sprays into both nostrils daily. 12/03/16 12/03/17  Jule Ser, DO  furosemide (LASIX) 40 MG tablet Take 1 tablet (40 mg total) by mouth daily. 05/27/17   Jule Ser, DO  lidocaine (XYLOCAINE) 2 % solution Use as directed 15 mLs in the mouth or throat as needed for mouth pain. 06/22/17   Glyn Ade, PA-C  lisinopril (PRINIVIL,ZESTRIL) 10 MG tablet Take 1 tablet (10 mg total) by mouth daily. 05/27/17  Jule Ser, DO  metoprolol succinate (TOPROL XL) 25 MG 24 hr tablet Take 0.5 tablets (12.5 mg total) by mouth daily. 05/27/17   Jule Ser, DO  nicotine (NICODERM CQ - DOSED IN MG/24 HOURS) 14 mg/24hr patch Place 1 patch (14 mg total) onto the skin daily. 02/25/17 02/25/18  Alphonzo Grieve, MD  potassium chloride SA (K-DUR,KLOR-CON) 20 MEQ tablet Take 2 tablets (40 mEq total) by mouth daily. 05/27/17   Jule Ser, DO  simvastatin (ZOCOR) 20 MG tablet Take 1 tablet (20  mg total) by mouth daily. 05/27/17   Jule Ser, DO  warfarin (COUMADIN) 6 MG tablet Take 1 tablet (6 mg total) by mouth daily. 02/25/17   Sid Falcon, MD    Family History Family History  Problem Relation Age of Onset  . Heart attack Unknown   . Breast cancer Maternal Aunt 30    Social History Social History   Tobacco Use  . Smoking status: Current Every Day Smoker    Packs/day: 0.10    Types: Cigarettes  . Smokeless tobacco: Never Used  . Tobacco comment: 1 pack per week  Substance Use Topics  . Alcohol use: No    Comment: beer  . Drug use: No     Allergies   Acetaminophen-codeine; Other; and Tramadol   Review of Systems Review of Systems  Musculoskeletal: Positive for arthralgias (Bilateral knees).  Skin: Positive for color change (bruise to right breast).  All other systems reviewed and are negative.    Physical Exam Updated Vital Signs BP (!) 158/85   Pulse 94   Temp 98.9 F (37.2 C) (Oral)   Resp 18   Ht 5\' 5"  (1.651 m)   Wt 68 kg (150 lb)   SpO2 99%   BMI 24.96 kg/m   Physical Exam  Constitutional: She is oriented to person, place, and time. She appears well-developed and well-nourished. She is cooperative. She is easily aroused. No distress.  HENT:  No abrasions, lacerations, erythema, contusions or signs of facial or head injury. No scalp, facial or nasal bone tenderness. No Raccoon's eyes. No Battle's sign. No hemotympanum, bilaterally. No epistaxis, septum midline No intraoral bleeding or injury  Eyes:  Lids normal EOMs and PERRL intact without pain No conjunctival injection  Neck:  No cervical spinous process tenderness No cervical paraspinal muscular tenderness Full active ROM of cervical spine 2+ carotid pulses bilaterally without bruits Trachea midline  Cardiovascular: Normal rate, regular rhythm, S1 normal, S2 normal and normal heart sounds. Exam reveals no distant heart sounds and no friction rub.  No murmur heard. Pulses:       Carotid pulses are 2+ on the right side, and 2+ on the left side.      Radial pulses are 2+ on the right side, and 2+ on the left side.       Dorsalis pedis pulses are 2+ on the right side, and 2+ on the left side.  Pulmonary/Chest: Effort normal. No respiratory distress. She has no decreased breath sounds.  Equal and symmetric chest wall expansion. No focal rib tenderness.   Abdominal:  Abdomen is soft NTND  Musculoskeletal: Normal range of motion. She exhibits no deformity.  Focal tenderness to right contusion, pain exacerbated with AROM of upper extremities.   Neurological: She is alert, oriented to person, place, and time and easily aroused.  A&O to self, place and time. Speech normal. Strength 5/5 in upper and lower extremities.   Sensation to light touch intact in upper and lower  extremities.  Gait normal.   No pronator drift. Intact finger to nose test. CN I not tested. CN II - XII intact bilaterally  Skin: Skin is warm and dry. Capillary refill takes less than 2 seconds. Ecchymosis noted.  Small area of tenderness and ecchymosis to right inferior right breast approx size of quarter coin. No surrounding erythema, edema, warmth, drainage.      ED Treatments / Results  Labs (all labs ordered are listed, but only abnormal results are displayed) Labs Reviewed - No data to display  EKG  EKG Interpretation None       Radiology Dg Chest 2 View  Result Date: 08/10/2017 CLINICAL DATA:  Right breast pain after fall 2 days ago. EXAM: CHEST  2 VIEW COMPARISON:  07/10/2015 FINDINGS: Cardiomegaly with interstitial edema. No pneumonic consolidation, effusion or pneumothorax. The patient is reportedly status post aortic and mitral valve replacement with only mitral valve prosthesis visible radiographically. Axillary clips project over the left breast. Status post left mastectomy. No acute osseous appearing abnormality. IMPRESSION: Cardiomegaly with diffuse interstitial prominence  consistent with mild interstitial edema. Left mastectomy with left axillary lymph node dissection clips. No acute osseous appearing abnormality. Electronically Signed   By: Ashley Royalty M.D.   On: 08/10/2017 20:24   Ct Head Wo Contrast  Result Date: 08/10/2017 CLINICAL DATA:  Status post fall with trauma to the head 2 days ago. EXAM: CT HEAD WITHOUT CONTRAST TECHNIQUE: Contiguous axial images were obtained from the base of the skull through the vertex without intravenous contrast. COMPARISON:  None. FINDINGS: Brain: No evidence of acute infarction, hemorrhage, hydrocephalus, extra-axial collection or mass lesion/mass effect. Vascular: Calcific atherosclerotic disease of the intra cavernous carotid arteries. Skull: Normal. Negative for fracture or focal lesion. Sinuses/Orbits: Partial ethmoid air cell opacification. Other: None. IMPRESSION: No acute intracranial abnormality. Electronically Signed   By: Fidela Salisbury M.D.   On: 08/10/2017 22:53   Dg Knee Complete 4 Views Left  Result Date: 08/10/2017 CLINICAL DATA:  Patient fell out of bed 2 nights ago onto carpeted floor. Medial bilateral knee pain. EXAM: LEFT KNEE - COMPLETE 4+ VIEW COMPARISON:  05/05/2017 FINDINGS: No evidence of fracture, dislocation, or joint effusion. No evidence of arthropathy or other focal bone abnormality. Soft tissues are unremarkable. Vascular calcifications along the tibial arteries. IMPRESSION: Negative for acute fracture, joint dislocation or significant joint effusion. Electronically Signed   By: Ashley Royalty M.D.   On: 08/10/2017 20:19   Dg Knee Complete 4 Views Right  Result Date: 08/10/2017 CLINICAL DATA:  Patient fell out of bed landing on knees. Medial right knee pain. EXAM: RIGHT KNEE - COMPLETE 4+ VIEW COMPARISON:  None. FINDINGS: No evidence of fracture, dislocation, or joint effusion. No evidence of arthropathy or other focal bone abnormality. Mild anterior soft tissue swelling overlying the patellar tendon.  IMPRESSION: Negative for acute fracture joint dislocation. No joint effusion. Mild anterior soft tissue swelling. Electronically Signed   By: Ashley Royalty M.D.   On: 08/10/2017 20:20    Procedures Procedures (including critical care time)  Medications Ordered in ED Medications - No data to display   Initial Impression / Assessment and Plan / ED Course  I have reviewed the triage vital signs and the nursing notes.  Pertinent labs & imaging results that were available during my care of the patient were reviewed by me and considered in my medical decision making (see chart for details).    67 year old female with history of pulmonary embolism and mechanical mitral  valve replacement on warfarin and is here for evaluation right breast ecchymosis and tenderness since fall last week. She rolled off the side of her bed and landed on the floor.  She reports left posterior head injury during the fall, with a bump to the area that she treated with ice and has been resolved. On my exam I do not appreciate any scalp, facial contusion, hematoma or tenderness. She has a small area of mild ecchymosis approximately the size of a quarter to the lower right breast without overlying signs of infection. Her neuro exam is normal. Given anticoagulation with recent head trauma CT scan of her head was obtained which was negative. Chest x-ray, right/left knee x-rays are normal. At this time patient deemed safe for discharge with conservative management of hematoma of the breast. She is to follow up with primary care doctor if symptoms persist. Discussed return precautions.  Final Clinical Impressions(s) / ED Diagnoses   Final diagnoses:  Contusion of right breast, initial encounter    ED Discharge Orders    None       Arlean Hopping 08/10/17 2302    Tegeler, Gwenyth Allegra, MD 08/11/17 [phone]

## 2017-08-19 DIAGNOSIS — M25561 Pain in right knee: Secondary | ICD-10-CM | POA: Diagnosis not present

## 2017-08-19 DIAGNOSIS — M25562 Pain in left knee: Secondary | ICD-10-CM | POA: Diagnosis not present

## 2017-08-19 DIAGNOSIS — G2581 Restless legs syndrome: Secondary | ICD-10-CM | POA: Diagnosis not present

## 2017-08-19 DIAGNOSIS — G5603 Carpal tunnel syndrome, bilateral upper limbs: Secondary | ICD-10-CM | POA: Diagnosis not present

## 2017-08-19 DIAGNOSIS — M545 Low back pain: Secondary | ICD-10-CM | POA: Diagnosis not present

## 2017-08-25 ENCOUNTER — Other Ambulatory Visit: Payer: Self-pay

## 2017-08-25 ENCOUNTER — Emergency Department (HOSPITAL_COMMUNITY)
Admission: EM | Admit: 2017-08-25 | Discharge: 2017-08-25 | Disposition: A | Discharge status: Home / Self Care | Payer: Medicare Other | Attending: Emergency Medicine | Admitting: Emergency Medicine

## 2017-08-25 ENCOUNTER — Encounter (HOSPITAL_COMMUNITY): Payer: Self-pay

## 2017-08-25 ENCOUNTER — Emergency Department (HOSPITAL_COMMUNITY): Payer: Medicare Other

## 2017-08-25 DIAGNOSIS — N6310 Unspecified lump in the right breast, unspecified quadrant: Secondary | ICD-10-CM | POA: Diagnosis not present

## 2017-08-25 DIAGNOSIS — N644 Mastodynia: Secondary | ICD-10-CM | POA: Diagnosis not present

## 2017-08-25 DIAGNOSIS — F17210 Nicotine dependence, cigarettes, uncomplicated: Secondary | ICD-10-CM | POA: Diagnosis not present

## 2017-08-25 DIAGNOSIS — Z853 Personal history of malignant neoplasm of breast: Secondary | ICD-10-CM | POA: Diagnosis not present

## 2017-08-25 DIAGNOSIS — Z79899 Other long term (current) drug therapy: Secondary | ICD-10-CM | POA: Diagnosis not present

## 2017-08-25 DIAGNOSIS — Z7901 Long term (current) use of anticoagulants: Secondary | ICD-10-CM

## 2017-08-25 DIAGNOSIS — R52 Pain, unspecified: Secondary | ICD-10-CM

## 2017-08-25 DIAGNOSIS — I110 Hypertensive heart disease with heart failure: Secondary | ICD-10-CM | POA: Diagnosis not present

## 2017-08-25 DIAGNOSIS — D68318 Other hemorrhagic disorder due to intrinsic circulating anticoagulants, antibodies, or inhibitors: Secondary | ICD-10-CM | POA: Diagnosis not present

## 2017-08-25 DIAGNOSIS — N6489 Other specified disorders of breast: Secondary | ICD-10-CM | POA: Diagnosis not present

## 2017-08-25 DIAGNOSIS — I5042 Chronic combined systolic (congestive) and diastolic (congestive) heart failure: Secondary | ICD-10-CM | POA: Insufficient documentation

## 2017-08-25 DIAGNOSIS — I2510 Atherosclerotic heart disease of native coronary artery without angina pectoris: Secondary | ICD-10-CM | POA: Insufficient documentation

## 2017-08-25 MED ORDER — CEPHALEXIN 250 MG PO CAPS
500.0000 mg | ORAL_CAPSULE | Freq: Once | ORAL | Status: AC
  Administered 2017-08-25: 500 mg via ORAL
  Filled 2017-08-25: qty 2

## 2017-08-25 MED ORDER — CLINDAMYCIN HCL 150 MG PO CAPS
300.0000 mg | ORAL_CAPSULE | Freq: Once | ORAL | Status: AC
  Administered 2017-08-25: 300 mg via ORAL
  Filled 2017-08-25: qty 2

## 2017-08-25 MED ORDER — HYDROCODONE-ACETAMINOPHEN 5-325 MG PO TABS: ORAL_TABLET | ORAL | 0 refills | Status: DC

## 2017-08-25 MED ORDER — CLINDAMYCIN HCL 150 MG PO CAPS: 300.0000 mg | ORAL_CAPSULE | Freq: Three times a day (TID) | ORAL | 0 refills | Status: DC

## 2017-08-25 MED ORDER — CEPHALEXIN 500 MG PO CAPS: 500.0000 mg | ORAL_CAPSULE | Freq: Four times a day (QID) | ORAL | 0 refills | Status: DC

## 2017-08-25 MED ORDER — HYDROCODONE-ACETAMINOPHEN 5-325 MG PO TABS
1.0000 | ORAL_TABLET | Freq: Once | ORAL | Status: AC
  Administered 2017-08-25: 1 via ORAL
  Filled 2017-08-25: qty 1

## 2017-08-25 MED ORDER — HYDROCODONE-ACETAMINOPHEN 5-325 MG PO TABS
1.0000 | ORAL_TABLET | Freq: Once | ORAL | Status: AC
Start: 2017-08-25 — End: 2017-08-25
  Administered 2017-08-25: 1 via ORAL
  Filled 2017-08-25: qty 1

## 2017-08-25 NOTE — ED Triage Notes (Signed)
Pt endorses lump on right breast since falling 2 weeks ago. Pt was evaluated here after fall and d/c. VSS

## 2017-08-25 NOTE — ED Notes (Signed)
ED Provider at bedside. 

## 2017-08-25 NOTE — ED Notes (Signed)
Patient transported to Ultrasound 

## 2017-08-25 NOTE — Discharge Instructions (Signed)
Call your primary care office first thing in the morning and tell them that you need a walk-in appointment to be seen that day.  It is important that you are seen so that they can arrange a visit to the breast center.

## 2017-08-25 NOTE — ED Provider Notes (Signed)
[facility] EMERGENCY DEPARTMENT Provider Note   CSN: [id number] Arrival date & time: 08/25/17  1651     History   Chief Complaint Chief Complaint  Patient presents with  . lump on breast   HPI   Blood pressure (!) 152/80, pulse 92, temperature 98 F (36.7 C), temperature source Oral, resp. rate 16, height 5\' 5"  (1.651 m), weight 77.1 kg (170 lb), SpO2 100 %.  Kim Anthony is a 67 y.o. female with past medical history significant for breast cancer in remission 18 years, anticoagulated with Coumadin for PE, had negative mammogram one month ago she had a trauma 2 weeks ago where she fell, she had swelling to the right breast she has been icing it however it is worsening in both pain and size.  He is also complaining of pain to the bilateral knees status post fall, she is ambulatory but with pain.  Past Medical History:  Diagnosis Date  . Breast cancer ([place])   . CHF (congestive heart failure) ([place])   . COPD (chronic obstructive pulmonary disease) ([place])   . Coronary artery disease   . History of mitral valve replacement with mechanical valve   . HLD (hyperlipidemia)   . Hypertension   . Pulmonary embolism [facility])     Patient Active Problem List   Diagnosis Date Noted  . Hx of breast cancer 05/27/2017  . Muscle spasm 02/25/2017  . Soft tissue mass 02/25/2017  . Tobacco use disorder 02/25/2017  . Lymphadenopathy, mediastinal 02/25/2017  . History of mitral valve replacement with mechanical valve 11/18/2016  . Essential hypertension 02/01/2016  . Allergic rhinitis 02/01/2016  . Healthcare maintenance 01/30/2016  . Chronic low back pain 07/22/2015  . Pulmonary embolism ([place]) 07/11/2015  . Chronic combined systolic and diastolic congestive heart failure ([place]) 07/11/2015  . Headache, common migraine 07/11/2015  . HLD (hyperlipidemia) 07/11/2015  . Hypokalemia 07/11/2015    Past Surgical History:  Procedure Laterality Date  . MASTECTOMY Left 2000s   for  breast cancer, also s/p radiation and chemo  . MECHANICAL AORTIC AND MITRAL VALVE REPLACEMENT      OB History    No data available       Home Medications    Prior to Admission medications   Medication Sig Start Date End Date Taking? Authorizing Provider  cephALEXin (KEFLEX) 500 MG capsule Take 1 capsule (500 mg total) by mouth 4 (four) times daily. 08/25/17   Alesi Zachery, Elmyra Ricks, PA-C  cetirizine (ZYRTEC) 10 MG tablet Take 1 tablet (10 mg total) by mouth daily. 05/27/17   Jule Ser, DO  Cetirizine HCl (ZYRTEC ALLERGY) 10 MG CAPS Take 1 capsule (10 mg total) by mouth daily. 06/22/17   Glyn Ade, PA-C  clindamycin (CLEOCIN) 150 MG capsule Take 2 capsules (300 mg total) by mouth 3 (three) times daily. May dispense as 150mg  capsules 08/25/17   Takeshi Teasdale, Elmyra Ricks, PA-C  diclofenac sodium (VOLTAREN) 1 % GEL Apply 2 g topically 3 (three) times daily as needed. 02/25/17   Alphonzo Grieve, MD  fluticasone (FLONASE) 50 MCG/ACT nasal spray Place 2 sprays into both nostrils daily. 12/03/16 12/03/17  Jule Ser, DO  furosemide (LASIX) 40 MG tablet Take 1 tablet (40 mg total) by mouth daily. 05/27/17   Jule Ser, DO  lidocaine (XYLOCAINE) 2 % solution Use as directed 15 mLs in the mouth or throat as needed for mouth pain. 06/22/17   Glyn Ade, PA-C  lisinopril (PRINIVIL,ZESTRIL) 10 MG tablet Take 1 tablet (10 mg total)  by mouth daily. 05/27/17   Jule Ser, DO  metoprolol succinate (TOPROL XL) 25 MG 24 hr tablet Take 0.5 tablets (12.5 mg total) by mouth daily. 05/27/17   Jule Ser, DO  nicotine (NICODERM CQ - DOSED IN MG/24 HOURS) 14 mg/24hr patch Place 1 patch (14 mg total) onto the skin daily. 02/25/17 02/25/18  Alphonzo Grieve, MD  potassium chloride SA (K-DUR,KLOR-CON) 20 MEQ tablet Take 2 tablets (40 mEq total) by mouth daily. 05/27/17   Jule Ser, DO  simvastatin (ZOCOR) 20 MG tablet Take 1 tablet (20 mg total) by mouth daily. 05/27/17   Jule Ser, DO  warfarin  (COUMADIN) 6 MG tablet Take 1 tablet (6 mg total) by mouth daily. 02/25/17   Sid Falcon, MD    Family History Family History  Problem Relation Age of Onset  . Heart attack Unknown   . Breast cancer Maternal Aunt 12    Social History Social History   Tobacco Use  . Smoking status: Current Every Day Smoker    Packs/day: 0.10    Types: Cigarettes  . Smokeless tobacco: Never Used  . Tobacco comment: 1 pack per week  Substance Use Topics  . Alcohol use: No    Comment: beer  . Drug use: No     Allergies   Acetaminophen-codeine; Other; and Tramadol   Review of Systems Review of Systems  A complete review of systems was obtained and all systems are negative except as noted in the HPI and PMH.   Physical Exam Updated Vital Signs BP (!) 152/80 (BP Location: Right Arm)   Pulse 92   Temp 98 F (36.7 C) (Oral)   Resp 16   Ht 5\' 5"  (1.651 m)   Wt 77.1 kg (170 lb)   SpO2 100%   BMI 28.29 kg/m   Physical Exam  Constitutional: She is oriented to person, place, and time. She appears well-developed and well-nourished. No distress.  HENT:  Head: Normocephalic and atraumatic.  Mouth/Throat: Oropharynx is clear and moist.  Eyes: Conjunctivae and EOM are normal. Pupils are equal, round, and reactive to light.  Neck: Normal range of motion.  Cardiovascular: Normal rate, regular rhythm and intact distal pulses.  Pulmonary/Chest: Effort normal and breath sounds normal.  Left-sided mastectomy, right side with no p'ea de orange.  Nipple inversion or nipple discharge, at 6:00 there is a 3 cm firm, mobile mass.  No axillary lymphadenopathy  Abdominal: Soft. There is no tenderness.  Musculoskeletal: Normal range of motion.  Neurological: She is alert and oriented to person, place, and time.  Skin: She is not diaphoretic.  Psychiatric: She has a normal mood and affect.  Nursing note and vitals reviewed.    ED Treatments / Results  Labs (all labs ordered are listed, but only  abnormal results are displayed) Labs Reviewed - No data to display  EKG  EKG Interpretation None       Radiology US Breast Ltd Uni Right Inc Axilla  Result Date: 08/25/2017 CLINICAL DATA:  History of left mastectomy, right breast pain EXAM: ULTRASOUND OF THE right BREAST COMPARISON:  None FINDINGS: FINDINGS Targeted sonography of the inner lower quadrant of the right breast is performed with attention to the 5 o'clock position 5 cm from nipple where the patient reports palpable mass and pain. In the region palpable mass and pain, are multiple heterogeneous hyperechoic mass like areas with small internal foci of complex fluid which appear contiguous. This area measures approximately 5.2 by 1.5 x 4 cm. Limited  ultrasound of the right axilla demonstrates nonspecific lymph nodes measuring up to 7 mm in maximum thickness. IMPRESSION: Heterogeneous hyperechoic masslike area within the inner lower quadrant of the right breast at the approximate 5 o'clock position, 5 cm from nipple, containing multiple small internal foci of complex fluid. This corresponds to the patient's palpable mass and pain. Findings could be secondary to mastitis with multiple small abscess. RECOMMENDATION: 1. If a breast abscess, or possible abscess, is demonstrated by ultrasound in the absence of systemic illness, non-intact overlying skin, or other clinical features requiring hospital admission, it is recommended that patient be started on an appropriate course of antibiotics and referred to the [facility] the following morning for possible abscess drainage. 2. If patient does not have a primary care physician and is to be referred to the [facility], patient is to be first referred the following morning to the [facility] who has agreed to aid in the outpatient management ([address] (phone# [phone]). Recommended antibiotics for outpatient treatment of  breast abscess: No MRSA risk 1. Keflex 500 mg po qid 2. Clindamycin 300 mg po tid MRSA risk 1. Bactrim 2 tabs po bid 2. Doxycycline 100 mg po bid Subareolar abscess, recurrent abscess, nipple retraction 1. Augmentin 875 mg po bid 2. Clindamycin 300 mg po tid Diagnostic mammography should also be considered when clinically feasible if not already recently performed. BI-RADS CATEGORY  3: Probably benign. Electronically Signed   By: Donavan Foil M.D.   On: 08/25/2017 20:03    Procedures Procedures (including critical care time)  Medications Ordered in ED Medications  cephALEXin (KEFLEX) capsule 500 mg (not administered)  clindamycin (CLEOCIN) capsule 300 mg (not administered)  HYDROcodone-acetaminophen (NORCO/VICODIN) 5-325 MG per tablet 1 tablet (not administered)  HYDROcodone-acetaminophen (NORCO/VICODIN) 5-325 MG per tablet 1 tablet (1 tablet Oral Given 08/25/17 1947)     Initial Impression / Assessment and Plan / ED Course  I have reviewed the triage vital signs and the nursing notes.  Pertinent labs & imaging results that were available during my care of the patient were reviewed by me and considered in my medical decision making (see chart for details).     Vitals:   08/25/17 1742 08/25/17 1743  BP: (!) 152/80   Pulse: 92   Resp: 16   Temp: 98 F (36.7 C)   TempSrc: Oral   SpO2: 100%   Weight:  77.1 kg (170 lb)  Height:  5\' 5"  (1.651 m)    Medications  cephALEXin (KEFLEX) capsule 500 mg (not administered)  clindamycin (CLEOCIN) capsule 300 mg (not administered)  HYDROcodone-acetaminophen (NORCO/VICODIN) 5-325 MG per tablet 1 tablet (not administered)  HYDROcodone-acetaminophen (NORCO/VICODIN) 5-325 MG per tablet 1 tablet (1 tablet Oral Given 08/25/17 1947)    Kim Anthony is 67 y.o. female presenting with enlarging right breast mass, this may be a hematoma, she is anticoagulated and she had a fall, however of concern she states that it is getting larger.  Give pain medication  obtain ultrasound to evaluate if this is hematoma versus mass.  Some with complex loculated mass.  Question possible breast abscess, this would make sense as the mass is enlarging.  I have discussed with general surgeon Dr. Collie Siad we who recommends that she be seen at the breast center in an expedited manner to have a needle aspiration to determine what exactly the fluid inside the Mast is.  She is anticoagulated with Coumadin, I have  discussed this with Janett Billow of internal medicine teaching service she will send a message to the scheduler and she is asked me to tell the patient to call in the morning to expedite a visit to her primary care doctor.  I will start her on clindamycin and Keflex.  She understands she needs to need by primary care to get the referral to the breast center first thing in the morning.  Evaluation does not show pathology that would require ongoing emergent intervention or inpatient treatment. Pt is hemodynamically stable and mentating appropriately. Discussed findings and plan with patient/guardian, who agrees with care plan. All questions answered. Return precautions discussed and outpatient follow up given.    Final Clinical Impressions(s) / ED Diagnoses   Final diagnoses:  Pain  Breast pain  Chronic anticoagulation    ED Discharge Orders        Ordered    clindamycin (CLEOCIN) 150 MG capsule  3 times daily     08/25/17 2107    cephALEXin (KEFLEX) 500 MG capsule  4 times daily     08/25/17 2107       Raivyn Kabler, Charna Elizabeth 08/25/17 2117    Julianne Rice, MD 08/30/17 [phone]

## 2017-08-26 ENCOUNTER — Other Ambulatory Visit: Payer: Self-pay | Admitting: Internal Medicine

## 2017-08-26 ENCOUNTER — Ambulatory Visit
Admission: RE | Admit: 2017-08-26 | Discharge: 2017-08-26 | Disposition: A | Discharge status: Home / Self Care | Payer: Medicare Other | Source: Ambulatory Visit | Attending: Internal Medicine | Admitting: Internal Medicine

## 2017-08-26 ENCOUNTER — Other Ambulatory Visit: Payer: Medicare Other

## 2017-08-26 ENCOUNTER — Ambulatory Visit (INDEPENDENT_AMBULATORY_CARE_PROVIDER_SITE_OTHER): Payer: Medicare Other | Admitting: Internal Medicine

## 2017-08-26 ENCOUNTER — Other Ambulatory Visit: Payer: Self-pay

## 2017-08-26 VITALS — BP 137/95 | HR 84 | Temp 97.7°F | Ht 65.5 in | Wt 165.7 lb

## 2017-08-26 DIAGNOSIS — Z853 Personal history of malignant neoplasm of breast: Secondary | ICD-10-CM

## 2017-08-26 DIAGNOSIS — Z923 Personal history of irradiation: Secondary | ICD-10-CM | POA: Diagnosis not present

## 2017-08-26 DIAGNOSIS — Z9012 Acquired absence of left breast and nipple: Secondary | ICD-10-CM

## 2017-08-26 DIAGNOSIS — R928 Other abnormal and inconclusive findings on diagnostic imaging of breast: Secondary | ICD-10-CM | POA: Diagnosis not present

## 2017-08-26 DIAGNOSIS — Z9221 Personal history of antineoplastic chemotherapy: Secondary | ICD-10-CM

## 2017-08-26 DIAGNOSIS — N6310 Unspecified lump in the right breast, unspecified quadrant: Secondary | ICD-10-CM | POA: Diagnosis not present

## 2017-08-26 DIAGNOSIS — N644 Mastodynia: Secondary | ICD-10-CM | POA: Diagnosis not present

## 2017-08-26 DIAGNOSIS — Z7901 Long term (current) use of anticoagulants: Secondary | ICD-10-CM | POA: Diagnosis not present

## 2017-08-26 DIAGNOSIS — N641 Fat necrosis of breast: Secondary | ICD-10-CM

## 2017-08-26 DIAGNOSIS — G8911 Acute pain due to trauma: Secondary | ICD-10-CM

## 2017-08-26 DIAGNOSIS — F17210 Nicotine dependence, cigarettes, uncomplicated: Secondary | ICD-10-CM | POA: Diagnosis not present

## 2017-08-26 NOTE — Patient Instructions (Addendum)
Thank you for allowing [place] to provide your care. Please pick up and take your antibiotics as prescribed. We have referred you on the the breast clinic. If you started to have fevers, the mass continues to grow, you begin to experience nipple discharge, or become nauseous please call back into the clinic. Please come back in 3 months for a follow-up with your PCP.

## 2017-08-26 NOTE — Progress Notes (Signed)
   CC: Right breast pain  HPI:  [name] is a 67 y.o. breast cancer status post left masectomy, radiation, and chemotherapy who presented to the clinic with right breast pain. Approximately 2 to 3 weeks ago the patient was lying in bed when she accidentally rolled out hitting her right breast, head, and knees on the floor. She was evaluated in the emergency department on 2/19 at which point she got a CT of the head that did not show any acute bleeding. She was told that her breast pain was likely traumatic and could be treated with Tylenol and ice. She states that since that time her best pain has increased and she now feels a note on her right breast. She presented again to the ED on 3/6 for further evaluation. At that point an ultrasound was obtained that revealed a 5 cm x 1.5 cm x 4 cm heterogeneous hypoechoic mass at the 5 o'clock position. The patient was prescribed Keflex and clindamycin and discharged with instructions to follow-up here.  Today she is feeling well. She has completed one day of antibiotics so far. Per the patient her right breast mass continues to increase in size and pain. She is having subjective fever/chills and fatigue. She states that she has not noticed any skin changes on the right breast or nipple discharge. She is on warfarin and continues to take it as prescribed. She has never had any adverse effects from such as bleeding. She states that she no longer follows with oncology.  Past Medical History:  Diagnosis Date  . Breast cancer ([place])   . CHF (congestive heart failure) ([place])   . COPD (chronic obstructive pulmonary disease) ([place])   . Coronary artery disease   . History of mitral valve replacement with mechanical valve   . HLD (hyperlipidemia)   . Hypertension   . Pulmonary embolism ([place])    Review of Systems:   12 point ROS preformed. All negative aside from those mentioned in the HPI.  Physical Exam: Vitals:   08/26/17 0835  BP: (!) 137/95  Pulse:  84  Temp: 97.7 F (36.5 C)  TempSrc: Oral  SpO2: 100%  Weight: 165 lb 11.2 oz (75.2 kg)  Height: 5' 5.5" (1.664 m)   General: Well nourished female in no acute distress HENT: Normocephalic, atraumatic, moist mucus membranes Pulm: Good air movement with no wheezing or crackles  CV: RRR, no murmurs, no rubs  Breast: Status post mascectomy of the left breast with centrally healed scar. Right breast appears normal without any skin dimpling, nipple retraction, nipple discharge, erythema, skin abrasion. She does have tenderness to palpation of the nodule. No axillary lymphadenopathy felt Abdomen: Active bowel sounds, soft, non-distended, no tenderness to palpation  Extremities: Pulses palpable in all extremities, no LE edema  Skin: Warm and dry  Neuro: Alert and oriented x 3  Assessment & Plan:   See Encounters Tab for problem based charting.  Patient discussed with Dr. Evette Doffing

## 2017-08-26 NOTE — Assessment & Plan Note (Addendum)
Kim Anthony is a 67 y.o. breast cancer status post left masectomy, radiation, and chemotherapy who presented to the clinic with a right breast mass and associated pain after traumatic injury. On physical exam I do not see any skin dimpling, nipple retraction, nipple discharge, erythema, or skin abrasions. There is a palpable tender 5 cm nodule at the 5 o'clock position. Based on the patient's history this is likely hematoma; however, infection cannot be ruled out as the patient is having subjective fevers/chills and the mass continues to grow with tenderness to palpation. I do think it is appropriate to refer her on to the breast clinic for further evaluation especially given that this patient has a history of breast cancer. She was instructed to continue her Keflex and clindamycin as prescribed by the ED. She will follow-up in three months for further evaluation.  Plan: - Refer to breast clinic for further evaluation  - Continue Keflex and clindamycin - Follow-up in 3 months

## 2017-08-26 NOTE — Progress Notes (Signed)
Internal Medicine Clinic Attending  Case discussed with Dr. Helberg at the time of the visit.  We reviewed the resident's history and exam and pertinent patient test results.  I agree with the assessment, diagnosis, and plan of care documented in the resident's note.    

## 2017-09-23 ENCOUNTER — Other Ambulatory Visit: Payer: Self-pay | Admitting: Internal Medicine

## 2017-09-23 DIAGNOSIS — I2699 Other pulmonary embolism without acute cor pulmonale: Secondary | ICD-10-CM

## 2017-11-09 ENCOUNTER — Ambulatory Visit (INDEPENDENT_AMBULATORY_CARE_PROVIDER_SITE_OTHER): Payer: Medicare Other | Admitting: Internal Medicine

## 2017-11-09 ENCOUNTER — Other Ambulatory Visit: Payer: Self-pay

## 2017-11-09 VITALS — BP 146/107 | HR 93 | Temp 98.9°F | Ht 65.0 in | Wt 175.9 lb

## 2017-11-09 DIAGNOSIS — Z952 Presence of prosthetic heart valve: Secondary | ICD-10-CM | POA: Diagnosis not present

## 2017-11-09 DIAGNOSIS — Z5181 Encounter for therapeutic drug level monitoring: Secondary | ICD-10-CM

## 2017-11-09 DIAGNOSIS — I5042 Chronic combined systolic (congestive) and diastolic (congestive) heart failure: Secondary | ICD-10-CM

## 2017-11-09 DIAGNOSIS — Z7901 Long term (current) use of anticoagulants: Secondary | ICD-10-CM

## 2017-11-09 DIAGNOSIS — F17210 Nicotine dependence, cigarettes, uncomplicated: Secondary | ICD-10-CM | POA: Diagnosis not present

## 2017-11-09 DIAGNOSIS — Z79899 Other long term (current) drug therapy: Secondary | ICD-10-CM

## 2017-11-09 DIAGNOSIS — R791 Abnormal coagulation profile: Secondary | ICD-10-CM

## 2017-11-09 DIAGNOSIS — Z8249 Family history of ischemic heart disease and other diseases of the circulatory system: Secondary | ICD-10-CM | POA: Diagnosis not present

## 2017-11-09 DIAGNOSIS — Z736 Limitation of activities due to disability: Secondary | ICD-10-CM | POA: Diagnosis not present

## 2017-11-09 DIAGNOSIS — M549 Dorsalgia, unspecified: Secondary | ICD-10-CM

## 2017-11-09 DIAGNOSIS — Z23 Encounter for immunization: Secondary | ICD-10-CM

## 2017-11-09 DIAGNOSIS — E2839 Other primary ovarian failure: Secondary | ICD-10-CM

## 2017-11-09 DIAGNOSIS — Z0000 Encounter for general adult medical examination without abnormal findings: Secondary | ICD-10-CM

## 2017-11-09 LAB — POCT INR: INR: 3.8 — AB (ref 2.0–3.0)

## 2017-11-09 NOTE — Progress Notes (Signed)
CC: lower extremity edema  HPI:  [name] is a 67 y.o. female with PMH below here to address her lower extremity edema in the setting of chronic mixed heart failure.    Please see A&P for status of the patient's chronic medical conditions  Past Medical History:  Diagnosis Date  . Breast cancer ([place])    Left Breast Cancer  . CHF (congestive heart failure) ([place])   . COPD (chronic obstructive pulmonary disease) ([place])   . Coronary artery disease   . History of mitral valve replacement with mechanical valve   . HLD (hyperlipidemia)   . Hypertension   . Pulmonary embolism ([place])    Review of Systems:  ROS: Pulmonary: pt denies increased work of breathing, shortness of breath,  Cardiac: pt denies palpitations, chest pain,  Abdominal: pt denies abdominal pain, nausea, vomiting, or diarrhea  Physical Exam:  Vitals:   11/09/17 1511  BP: (!) 146/107  Pulse: 93  Temp: 98.9 F (37.2 C)  TempSrc: Oral  SpO2: 92%  Weight: 175 lb 14.4 oz (79.8 kg)  Height: 5\' 5"  (1.651 m)   Physical Exam  Constitutional: No distress.  Cardiovascular: Normal rate, regular rhythm and normal heart sounds. Exam reveals no gallop and no friction rub.  No murmur heard. Mechanical valve appreciated no associated regurgitation murmur heard  Pulmonary/Chest: Effort normal and breath sounds normal. No respiratory distress. She has no wheezes. She has no rales. She exhibits no tenderness.  Abdominal: Soft. Bowel sounds are normal. She exhibits no distension and no mass. There is no tenderness. There is no rebound and no guarding.  Musculoskeletal: She exhibits edema (1-2+ LE edema bilaterally).  Neurological: She is alert.  Skin: She is not diaphoretic.    Social History   Socioeconomic History  . Marital status: Single    Spouse name: Not on file  . Number of children: Not on file  . Years of education: Not on file  . Highest education level: Not on file  Occupational History  . Not on file    Social Needs  . Financial resource strain: Not on file  . Food insecurity:    Worry: Not on file    Inability: Not on file  . Transportation needs:    Medical: Not on file    Non-medical: Not on file  Tobacco Use  . Smoking status: Current Every Day Smoker    Packs/day: 0.10    Types: Cigarettes  . Smokeless tobacco: Never Used  . Tobacco comment: 1 pack per week  Substance and Sexual Activity  . Alcohol use: No    Comment: beer  . Drug use: No  . Sexual activity: Not on file  Lifestyle  . Physical activity:    Days per week: Not on file    Minutes per session: Not on file  . Stress: Not on file  Relationships  . Social connections:    Talks on phone: Not on file    Gets together: Not on file    Attends religious service: Not on file    Active member of club or organization: Not on file    Attends meetings of clubs or organizations: Not on file    Relationship status: Not on file  . Intimate partner violence:    Fear of current or ex partner: Not on file    Emotionally abused: Not on file    Physically abused: Not on file    Forced sexual activity: Not on file  Other Topics  Concern  . Not on file  Social History Narrative   ** Merged History Encounter **        Family History  Problem Relation Age of Onset  . Heart attack Unknown   . Breast cancer Maternal Aunt 50    Assessment & Plan:   See Encounters Tab for problem based charting.  Patient discussed with Dr. Angelia Mould

## 2017-11-09 NOTE — Patient Instructions (Addendum)
Kim Anthony, please take an extra dose of lasix for the next 3 days for your leg swelling.  If the swelling resolves before then please resume your usual dose.  We will take some labs today and look at your INR.  Your INR was 3.8 and therefore you will need to take only a half pill of your warfarin every Wednesday and follow up in 2 weeks with Kim Anthony to repeat your INR.  You will need to schedule regular visits to adjust your warfarin dosing/keep an eye on your therapeutic level.  Additionally, we will refer you for colonosopy, and bone density scan.  We will also order an ultrasound of your heart to evaluate your cardiac function and the status of your mechanical valve.    DASH Eating Plan DASH stands for "Dietary Approaches to Stop Hypertension." The DASH eating plan is a healthy eating plan that has been shown to reduce high blood pressure (hypertension). It may also reduce your risk for type 2 diabetes, heart disease, and stroke. The DASH eating plan may also help with weight loss. What are tips for following this plan? General guidelines  Avoid eating more than 2,300 mg (milligrams) of salt (sodium) a day. If you have hypertension, you may need to reduce your sodium intake to 1,500 mg a day.  Limit alcohol intake to no more than 1 drink a day for nonpregnant women and 2 drinks a day for men. One drink equals 12 oz of beer, 5 oz of wine, or 1 oz of hard liquor.  Work with your health care provider to maintain a healthy body weight or to lose weight. Ask what an ideal weight is for you.  Get at least 30 minutes of exercise that causes your heart to beat faster (aerobic exercise) most days of the week. Activities may include walking, swimming, or biking.  Work with your health care provider or diet and nutrition specialist (dietitian) to adjust your eating plan to your individual calorie needs. Reading food labels  Check food labels for the amount of sodium per serving. Choose foods with less  than 5 percent of the Daily Value of sodium. Generally, foods with less than 300 mg of sodium per serving fit into this eating plan.  To find whole grains, look for the word "whole" as the first word in the ingredient list. Shopping  Buy products labeled as "low-sodium" or "no salt added."  Buy fresh foods. Avoid canned foods and premade or frozen meals. Cooking  Avoid adding salt when cooking. Use salt-free seasonings or herbs instead of table salt or sea salt. Check with your health care provider or pharmacist before using salt substitutes.  Do not fry foods. Cook foods using healthy methods such as baking, boiling, grilling, and broiling instead.  Cook with heart-healthy oils, such as olive, canola, soybean, or sunflower oil. Meal planning   Eat a balanced diet that includes: ? 5 or more servings of fruits and vegetables each day. At each meal, try to fill half of your plate with fruits and vegetables. ? Up to 6-8 servings of whole grains each day. ? Less than 6 oz of lean meat, poultry, or fish each day. A 3-oz serving of meat is about the same size as a deck of cards. One egg equals 1 oz. ? 2 servings of low-fat dairy each day. ? A serving of nuts, seeds, or beans 5 times each week. ? Heart-healthy fats. Healthy fats called Omega-3 fatty acids are found in foods such  as flaxseeds and coldwater fish, like sardines, salmon, and mackerel.  Limit how much you eat of the following: ? Canned or prepackaged foods. ? Food that is high in trans fat, such as fried foods. ? Food that is high in saturated fat, such as fatty meat. ? Sweets, desserts, sugary drinks, and other foods with added sugar. ? Full-fat dairy products.  Do not salt foods before eating.  Try to eat at least 2 vegetarian meals each week.  Eat more home-cooked food and less restaurant, buffet, and fast food.  When eating at a restaurant, ask that your food be prepared with less salt or no salt, if possible. What  foods are recommended? The items listed may not be a complete list. Talk with your dietitian about what dietary choices are best for you. Grains Whole-grain or whole-wheat bread. Whole-grain or whole-wheat pasta. Brown rice. Kim Anthony. Bulgur. Whole-grain and low-sodium cereals. Pita bread. Low-fat, low-sodium crackers. Whole-wheat flour tortillas. Vegetables Fresh or frozen vegetables (raw, steamed, roasted, or grilled). Low-sodium or reduced-sodium tomato and vegetable juice. Low-sodium or reduced-sodium tomato sauce and tomato paste. Low-sodium or reduced-sodium canned vegetables. Fruits All fresh, dried, or frozen fruit. Canned fruit in natural juice (without added sugar). Meat and other protein foods Skinless chicken or [place]. Ground chicken or [place]. Pork with fat trimmed off. Fish and seafood. Egg whites. Dried beans, peas, or lentils. Unsalted nuts, nut butters, and seeds. Unsalted canned beans. Lean cuts of beef with fat trimmed off. Low-sodium, lean deli meat. Dairy Low-fat (1%) or fat-free (skim) milk. Fat-free, low-fat, or reduced-fat cheeses. Nonfat, low-sodium ricotta or cottage cheese. Low-fat or nonfat yogurt. Low-fat, low-sodium cheese. Fats and oils Soft margarine without trans fats. Vegetable oil. Low-fat, reduced-fat, or light mayonnaise and salad dressings (reduced-sodium). Canola, safflower, olive, soybean, and sunflower oils. Avocado. Seasoning and other foods Herbs. Spices. Seasoning mixes without salt. Unsalted popcorn and pretzels. Fat-free sweets. What foods are not recommended? The items listed may not be a complete list. Talk with your dietitian about what dietary choices are best for you. Grains Baked goods made with fat, such as croissants, muffins, or some breads. Dry pasta or rice meal packs. Vegetables Creamed or fried vegetables. Vegetables in a cheese sauce. Regular canned vegetables (not low-sodium or reduced-sodium). Regular canned tomato sauce and  paste (not low-sodium or reduced-sodium). Regular tomato and vegetable juice (not low-sodium or reduced-sodium). Kim Anthony. Olives. Fruits Canned fruit in a light or heavy syrup. Fried fruit. Fruit in cream or butter sauce. Meat and other protein foods Fatty cuts of meat. Ribs. Fried meat. Berniece Salines. Sausage. Bologna and other processed lunch meats. Salami. Fatback. Hotdogs. Bratwurst. Salted nuts and seeds. Canned beans with added salt. Canned or smoked fish. Whole eggs or egg yolks. Chicken or [place] with skin. Dairy Whole or 2% milk, cream, and half-and-half. Whole or full-fat cream cheese. Whole-fat or sweetened yogurt. Full-fat cheese. Nondairy creamers. Whipped toppings. Processed cheese and cheese spreads. Fats and oils Butter. Stick margarine. Lard. Shortening. Ghee. Bacon fat. Tropical oils, such as coconut, palm kernel, or palm oil. Seasoning and other foods Salted popcorn and pretzels. Onion salt, garlic salt, seasoned salt, table salt, and sea salt. Worcestershire sauce. Tartar sauce. Barbecue sauce. Teriyaki sauce. Soy sauce, including reduced-sodium. Steak sauce. Canned and packaged gravies. Fish sauce. Oyster sauce. Cocktail sauce. Horseradish that you find on the shelf. Ketchup. Mustard. Meat flavorings and tenderizers. Bouillon cubes. Hot sauce and Tabasco sauce. Premade or packaged marinades. Premade or packaged taco seasonings. Relishes. Regular salad dressings.  Where to find more information:  National Heart, Lung, and [place]: https://wilson-eaton.com/  American Heart Association: www.heart.org Summary  The DASH eating plan is a healthy eating plan that has been shown to reduce high blood pressure (hypertension). It may also reduce your risk for type 2 diabetes, heart disease, and stroke.  With the DASH eating plan, you should limit salt (sodium) intake to 2,300 mg a day. If you have hypertension, you may need to reduce your sodium intake to 1,500 mg a day.  When on the DASH  eating plan, aim to eat more fresh fruits and vegetables, whole grains, lean proteins, low-fat dairy, and heart-healthy fats.  Work with your health care provider or diet and nutrition specialist (dietitian) to adjust your eating plan to your individual calorie needs. This information is not intended to replace advice given to you by your health care provider. Make sure you discuss any questions you have with your health care provider. Document Released: 05/28/2011 Document Revised: 06/01/2016 Document Reviewed: 06/01/2016 Elsevier Interactive Patient Education  [employer].

## 2017-11-10 ENCOUNTER — Telehealth: Payer: Self-pay | Admitting: Internal Medicine

## 2017-11-10 LAB — BMP8+ANION GAP
Anion Gap: 17 mmol/L (ref 10.0–18.0)
BUN / CREAT RATIO: 11 — AB (ref 12–28)
BUN: 8 mg/dL (ref 8–27)
CHLORIDE: 95 mmol/L — AB (ref 96–106)
CO2: 28 mmol/L (ref 20–29)
Calcium: 8.7 mg/dL (ref 8.7–10.3)
Creatinine, Ser: 0.71 mg/dL (ref 0.57–1.00)
GFR calc Af Amer: 101 mL/min/{1.73_m2} (ref >59)
GFR calc non Af Amer: 88 mL/min/{1.73_m2} (ref >59)
GLUCOSE: 95 mg/dL (ref 65–99)
Potassium: 3.1 mmol/L — ABNORMAL LOW (ref 3.5–5.2)
Sodium: 140 mmol/L (ref 134–144)

## 2017-11-10 LAB — CBC
Hematocrit: 38.3 % (ref 34.0–46.6)
Hemoglobin: 12.6 g/dL (ref 11.1–15.9)
MCH: 32.6 pg (ref 26.6–33.0)
MCHC: 32.9 g/dL (ref 31.5–35.7)
MCV: 99 fL — AB (ref 79–97)
Platelets: 428 10*3/uL (ref 150–450)
RBC: 3.86 x10E6/uL (ref 3.77–5.28)
RDW: 12.4 % (ref 12.3–15.4)
WBC: 8.1 10*3/uL (ref 3.4–10.8)

## 2017-11-10 MED ORDER — POTASSIUM CHLORIDE 20 MEQ/15ML (10%) PO SOLN: ORAL | 0 refills | Status: DC

## 2017-11-10 NOTE — Telephone Encounter (Signed)
Spoke with patient regarding low potassium.  She has not been taking pills due to difficulty swallowing.  Refilled potassium in solution form instead, pt understands and is agreeable.  Explained that she will take an extra dose for the next three days just like the lasix, then go back to the single dose daily.

## 2017-11-10 NOTE — Assessment & Plan Note (Signed)
>>  ASSESSMENT AND PLAN FOR HFREF (HEART FAILURE WITH REDUCED EJECTION FRACTION) (HCC) WRITTEN ON 09/14/2023 11:44 AM BY Tian Mcmurtrey, DO   >>ASSESSMENT AND PLAN FOR CHRONIC HFREF (HEART FAILURE WITH REDUCED EJECTION FRACTION) (HCC) WRITTEN ON 09/14/2023 11:43 AM BY Ameet Sandy, DO   >>ASSESSMENT AND PLAN FOR CHF (CONGESTIVE HEART FAILURE) (HCC) WRITTEN ON 09/14/2023 11:43 AM BY Ladale Sherburn, DO   >>ASSESSMENT AND PLAN FOR BIVENTRICULAR HEART FAILURE WITH REDUCED LEFT VENTRICULAR FUNCTION (HCC) WRITTEN ON 11/10/2017 11:41 AM BY Angelita Ingles, MD  She reports breathing well but worsening lower extremity edema.  Currently taking lasix 40mg  daily, no decrease in urination.  Doesn't check weight at home 5lb weight gain since March by office weights.  Limited mobility due to back pain will take breaks when walking due to pain not SOB.  Does not get short of breath checking mail.  No SOB going up and down stairs.  Eats a lot of salt, drinks sodas, 2L of soda polished off in 1-2 days.  1-2 + pitting edema noted in bilateral lower extremities.   -asked pt to double up on lasix 40mg  to 80mg  for the next 3 days with instructions to stop if swelling resolves before then -potassium low on repeat bmp pt not taking potassium due to difficulty swallowing pills.  Will refill potassium packets vs pills and give extra dose while pt taking extra lasix.   -Dash diet given to help prevent edema which I expect is a large component of her swelling.

## 2017-11-10 NOTE — Progress Notes (Signed)
[facility] Attending  Case discussed with Dr. Winfrey  at the time of the visit.  We reviewed the resident's history and exam and pertinent patient test results.  I agree with the assessment, diagnosis, and plan of care documented in the resident's note.  

## 2017-11-10 NOTE — Assessment & Plan Note (Signed)
prevnar 13, colonoscopy referral, bone scan referrals placed today

## 2017-11-10 NOTE — Assessment & Plan Note (Signed)
Lab Results  Component Value Date   INR 3.8 (A) 11/09/2017   INR 6.2 05/27/2017   INR 2.0 02/25/2017   Pt with slightly supratherapeutic INR today. Has not followed up regularly for checks.  On warfarin 6mg  daily.  Has not followed up with cardiology for mitral valve.  -Instructed pt to take 1/2 pill (3mg ) every 3rd day Wednesday and to follow up with recheck in 2 weeks with Dr. Maudie Mercury. -ECHO ordered, to help identify functionality of valve and screen for progression of heart failure.

## 2017-11-10 NOTE — Assessment & Plan Note (Signed)
>>  ASSESSMENT AND PLAN FOR CHF (CONGESTIVE HEART FAILURE) (HCC) WRITTEN ON 09/14/2023 11:43 AM BY Yasiel Goyne, DO   >>ASSESSMENT AND PLAN FOR BIVENTRICULAR HEART FAILURE WITH REDUCED LEFT VENTRICULAR FUNCTION (HCC) WRITTEN ON 11/10/2017 11:41 AM BY Angelita Ingles, MD  She reports breathing well but worsening lower extremity edema.  Currently taking lasix 40mg  daily, no decrease in urination.  Doesn't check weight at home 5lb weight gain since March by office weights.  Limited mobility due to back pain will take breaks when walking due to pain not SOB.  Does not get short of breath checking mail.  No SOB going up and down stairs.  Eats a lot of salt, drinks sodas, 2L of soda polished off in 1-2 days.  1-2 + pitting edema noted in bilateral lower extremities.   -asked pt to double up on lasix 40mg  to 80mg  for the next 3 days with instructions to stop if swelling resolves before then -potassium low on repeat bmp pt not taking potassium due to difficulty swallowing pills.  Will refill potassium packets vs pills and give extra dose while pt taking extra lasix.   -Dash diet given to help prevent edema which I expect is a large component of her swelling.

## 2017-11-10 NOTE — Assessment & Plan Note (Signed)
>>  ASSESSMENT AND PLAN FOR BIVENTRICULAR HEART FAILURE WITH REDUCED LEFT VENTRICULAR FUNCTION (HCC) WRITTEN ON 11/10/2017 11:41 AM BY Kim Ingles, MD  She reports breathing well but worsening lower extremity edema.  Currently taking lasix 40mg  daily, no decrease in urination.  Doesn't check weight at home 5lb weight gain since March by office weights.  Limited mobility due to back pain will take breaks when walking due to pain not SOB.  Does not get short of breath checking mail.  No SOB going up and down stairs.  Eats a lot of salt, drinks sodas, 2L of soda polished off in 1-2 days.  1-2 + pitting edema noted in bilateral lower extremities.   -asked pt to double up on lasix 40mg  to 80mg  for the next 3 days with instructions to stop if swelling resolves before then -potassium low on repeat bmp pt not taking potassium due to difficulty swallowing pills.  Will refill potassium packets vs pills and give extra dose while pt taking extra lasix.   -Dash diet given to help prevent edema which I expect is a large component of her swelling.

## 2017-11-10 NOTE — Assessment & Plan Note (Signed)
>>  ASSESSMENT AND PLAN FOR CHRONIC HFREF (HEART FAILURE WITH REDUCED EJECTION FRACTION) (HCC) WRITTEN ON 09/14/2023 11:43 AM BY Bobbiejo Ishikawa, DO   >>ASSESSMENT AND PLAN FOR CHF (CONGESTIVE HEART FAILURE) (HCC) WRITTEN ON 09/14/2023 11:43 AM BY Kingdavid Leinbach, DO   >>ASSESSMENT AND PLAN FOR BIVENTRICULAR HEART FAILURE WITH REDUCED LEFT VENTRICULAR FUNCTION (HCC) WRITTEN ON 11/10/2017 11:41 AM BY Angelita Ingles, MD  She reports breathing well but worsening lower extremity edema.  Currently taking lasix 40mg  daily, no decrease in urination.  Doesn't check weight at home 5lb weight gain since March by office weights.  Limited mobility due to back pain will take breaks when walking due to pain not SOB.  Does not get short of breath checking mail.  No SOB going up and down stairs.  Eats a lot of salt, drinks sodas, 2L of soda polished off in 1-2 days.  1-2 + pitting edema noted in bilateral lower extremities.   -asked pt to double up on lasix 40mg  to 80mg  for the next 3 days with instructions to stop if swelling resolves before then -potassium low on repeat bmp pt not taking potassium due to difficulty swallowing pills.  Will refill potassium packets vs pills and give extra dose while pt taking extra lasix.   -Dash diet given to help prevent edema which I expect is a large component of her swelling.

## 2017-11-10 NOTE — Assessment & Plan Note (Addendum)
She reports breathing well but worsening lower extremity edema.  Currently taking lasix 40mg  daily, no decrease in urination.  Doesn't check weight at home 5lb weight gain since March by office weights.  Limited mobility due to back pain will take breaks when walking due to pain not SOB.  Does not get short of breath checking mail.  No SOB going up and down stairs.  Eats a lot of salt, drinks sodas, 2L of soda polished off in 1-2 days.  1-2 + pitting edema noted in bilateral lower extremities.   -asked pt to double up on lasix 40mg  to 80mg  for the next 3 days with instructions to stop if swelling resolves before then -potassium low on repeat bmp pt not taking potassium due to difficulty swallowing pills.  Will refill potassium packets vs pills and give extra dose while pt taking extra lasix.   -Dash diet given to help prevent edema which I expect is a large component of her swelling.

## 2017-11-24 ENCOUNTER — Observation Stay (HOSPITAL_COMMUNITY)
Admission: EM | Admit: 2017-11-24 | Discharge: 2017-11-26 | Disposition: A | Discharge status: Home / Self Care | Payer: Medicare Other | Attending: Oncology | Admitting: Oncology

## 2017-11-24 ENCOUNTER — Emergency Department (HOSPITAL_COMMUNITY): Payer: Medicare Other

## 2017-11-24 ENCOUNTER — Encounter (HOSPITAL_COMMUNITY): Payer: Self-pay | Admitting: Emergency Medicine

## 2017-11-24 DIAGNOSIS — F17210 Nicotine dependence, cigarettes, uncomplicated: Secondary | ICD-10-CM | POA: Insufficient documentation

## 2017-11-24 DIAGNOSIS — E785 Hyperlipidemia, unspecified: Secondary | ICD-10-CM | POA: Diagnosis not present

## 2017-11-24 DIAGNOSIS — I110 Hypertensive heart disease with heart failure: Secondary | ICD-10-CM | POA: Insufficient documentation

## 2017-11-24 DIAGNOSIS — Z888 Allergy status to other drugs, medicaments and biological substances status: Secondary | ICD-10-CM | POA: Insufficient documentation

## 2017-11-24 DIAGNOSIS — N179 Acute kidney failure, unspecified: Secondary | ICD-10-CM | POA: Diagnosis not present

## 2017-11-24 DIAGNOSIS — I10 Essential (primary) hypertension: Secondary | ICD-10-CM

## 2017-11-24 DIAGNOSIS — Z952 Presence of prosthetic heart valve: Secondary | ICD-10-CM | POA: Insufficient documentation

## 2017-11-24 DIAGNOSIS — Z79899 Other long term (current) drug therapy: Secondary | ICD-10-CM | POA: Diagnosis not present

## 2017-11-24 DIAGNOSIS — R0602 Shortness of breath: Secondary | ICD-10-CM | POA: Diagnosis not present

## 2017-11-24 DIAGNOSIS — J440 Chronic obstructive pulmonary disease with acute lower respiratory infection: Secondary | ICD-10-CM | POA: Diagnosis not present

## 2017-11-24 DIAGNOSIS — Z86711 Personal history of pulmonary embolism: Secondary | ICD-10-CM | POA: Insufficient documentation

## 2017-11-24 DIAGNOSIS — J441 Chronic obstructive pulmonary disease with (acute) exacerbation: Secondary | ICD-10-CM | POA: Diagnosis not present

## 2017-11-24 DIAGNOSIS — J449 Chronic obstructive pulmonary disease, unspecified: Secondary | ICD-10-CM

## 2017-11-24 DIAGNOSIS — I5042 Chronic combined systolic (congestive) and diastolic (congestive) heart failure: Secondary | ICD-10-CM | POA: Insufficient documentation

## 2017-11-24 DIAGNOSIS — J209 Acute bronchitis, unspecified: Secondary | ICD-10-CM | POA: Diagnosis not present

## 2017-11-24 DIAGNOSIS — Z853 Personal history of malignant neoplasm of breast: Secondary | ICD-10-CM | POA: Diagnosis not present

## 2017-11-24 DIAGNOSIS — Z7901 Long term (current) use of anticoagulants: Secondary | ICD-10-CM | POA: Diagnosis not present

## 2017-11-24 DIAGNOSIS — I2510 Atherosclerotic heart disease of native coronary artery without angina pectoris: Secondary | ICD-10-CM | POA: Insufficient documentation

## 2017-11-24 LAB — CBC WITH DIFFERENTIAL/PLATELET
Abs Immature Granulocytes: 0.1 10*3/uL (ref 0.0–0.1)
BASOS ABS: 0.1 10*3/uL (ref 0.0–0.1)
BASOS PCT: 1 %
EOS PCT: 1 %
Eosinophils Absolute: 0.1 10*3/uL (ref 0.0–0.7)
HCT: 37.3 % (ref 36.0–46.0)
HEMOGLOBIN: 11.8 g/dL — AB (ref 12.0–15.0)
Immature Granulocytes: 1 %
Lymphocytes Relative: 19 %
Lymphs Abs: 2.5 10*3/uL (ref 0.7–4.0)
MCH: 30.9 pg (ref 26.0–34.0)
MCHC: 31.6 g/dL (ref 30.0–36.0)
MCV: 97.6 fL (ref 78.0–100.0)
MONO ABS: 0.8 10*3/uL (ref 0.1–1.0)
Monocytes Relative: 6 %
Neutro Abs: 9.9 10*3/uL — ABNORMAL HIGH (ref 1.7–7.7)
Neutrophils Relative %: 72 %
Platelets: 444 10*3/uL — ABNORMAL HIGH (ref 150–400)
RBC: 3.82 MIL/uL — AB (ref 3.87–5.11)
RDW: 11.8 % (ref 11.5–15.5)
WBC: 13.4 10*3/uL — AB (ref 4.0–10.5)

## 2017-11-24 LAB — COMPREHENSIVE METABOLIC PANEL
ALBUMIN: 3.5 g/dL (ref 3.5–5.0)
ALK PHOS: 82 U/L (ref 38–126)
ALT: 15 U/L (ref 14–54)
AST: 26 U/L (ref 15–41)
Anion gap: 13 (ref 5–15)
BUN: 11 mg/dL (ref 6–20)
CALCIUM: 8.4 mg/dL — AB (ref 8.9–10.3)
CO2: 26 mmol/L (ref 22–32)
Chloride: 95 mmol/L — ABNORMAL LOW (ref 101–111)
Creatinine, Ser: 1.17 mg/dL — ABNORMAL HIGH (ref 0.44–1.00)
GFR calc non Af Amer: 47 mL/min — ABNORMAL LOW (ref >60)
GFR, EST AFRICAN AMERICAN: 54 mL/min — AB (ref >60)
GLUCOSE: 100 mg/dL — AB (ref 65–99)
POTASSIUM: 3 mmol/L — AB (ref 3.5–5.1)
SODIUM: 134 mmol/L — AB (ref 135–145)
TOTAL PROTEIN: 8.5 g/dL — AB (ref 6.5–8.1)
Total Bilirubin: 1.4 mg/dL — ABNORMAL HIGH (ref 0.3–1.2)

## 2017-11-24 LAB — PROTIME-INR
INR: 2.69
PROTHROMBIN TIME: 28.4 s — AB (ref 11.4–15.2)

## 2017-11-24 LAB — BRAIN NATRIURETIC PEPTIDE: B NATRIURETIC PEPTIDE 5: 74 pg/mL (ref 0.0–100.0)

## 2017-11-24 LAB — I-STAT TROPONIN, ED: Troponin i, poc: 0.01 ng/mL (ref 0.00–0.08)

## 2017-11-24 MED ORDER — ALBUTEROL SULFATE (2.5 MG/3ML) 0.083% IN NEBU
5.0000 mg | INHALATION_SOLUTION | Freq: Once | RESPIRATORY_TRACT | Status: AC
  Administered 2017-11-24: 5 mg via RESPIRATORY_TRACT
  Filled 2017-11-24: qty 6

## 2017-11-24 NOTE — ED Provider Notes (Signed)
Patient placed in Quick Look pathway, seen and evaluated   Chief Complaint: productive cough   HPI:   67 year old female with a history of PE on Coumadin, COPD, and chronic combined systolic and diastolic congestive heart failure who presents to the emergency department with a chief complaint of productive cough with associated chest congestion, dyspnea with exertion, onset this week.  She denies fever chills. She denies swelling in her legs.  She reports that she has been compliant with her home Coumadin.  No recent changes in dosing or missed doses.   ROS: Cough, dyspnea, chest congestion  Physical Exam:   Gen: No distress  Neuro: Awake and Alert  Skin: Warm    Focused Exam: Scattered bilateral wheezes.  Crackles heard in the left lung base.  Heart is regular rate and rhythm.  Initiation of care has begun. The patient has been counseled on the process, plan, and necessity for staying for the completion/evaluation, and the remainder of the medical screening examination    Joanne Gavel, PA-C 11/24/17 1931    Sherwood Gambler, MD 11/29/17 [phone]

## 2017-11-24 NOTE — ED Triage Notes (Signed)
Patient reports worsening productive cough with chest congestion , exertional dyspnea onset this week , history of COPD /CHF .

## 2017-11-25 ENCOUNTER — Encounter (HOSPITAL_COMMUNITY): Payer: Self-pay | Admitting: Emergency Medicine

## 2017-11-25 ENCOUNTER — Other Ambulatory Visit: Payer: Self-pay

## 2017-11-25 DIAGNOSIS — Z7901 Long term (current) use of anticoagulants: Secondary | ICD-10-CM | POA: Diagnosis not present

## 2017-11-25 DIAGNOSIS — J440 Chronic obstructive pulmonary disease with acute lower respiratory infection: Secondary | ICD-10-CM | POA: Diagnosis not present

## 2017-11-25 DIAGNOSIS — Z885 Allergy status to narcotic agent status: Secondary | ICD-10-CM

## 2017-11-25 DIAGNOSIS — Z952 Presence of prosthetic heart valve: Secondary | ICD-10-CM

## 2017-11-25 DIAGNOSIS — I110 Hypertensive heart disease with heart failure: Secondary | ICD-10-CM | POA: Diagnosis not present

## 2017-11-25 DIAGNOSIS — J441 Chronic obstructive pulmonary disease with (acute) exacerbation: Secondary | ICD-10-CM | POA: Diagnosis not present

## 2017-11-25 DIAGNOSIS — R011 Cardiac murmur, unspecified: Secondary | ICD-10-CM

## 2017-11-25 DIAGNOSIS — N179 Acute kidney failure, unspecified: Secondary | ICD-10-CM

## 2017-11-25 DIAGNOSIS — I5042 Chronic combined systolic (congestive) and diastolic (congestive) heart failure: Secondary | ICD-10-CM | POA: Diagnosis not present

## 2017-11-25 DIAGNOSIS — Z720 Tobacco use: Secondary | ICD-10-CM | POA: Diagnosis not present

## 2017-11-25 DIAGNOSIS — J449 Chronic obstructive pulmonary disease, unspecified: Secondary | ICD-10-CM

## 2017-11-25 DIAGNOSIS — Z888 Allergy status to other drugs, medicaments and biological substances status: Secondary | ICD-10-CM | POA: Diagnosis not present

## 2017-11-25 DIAGNOSIS — J209 Acute bronchitis, unspecified: Secondary | ICD-10-CM

## 2017-11-25 DIAGNOSIS — I10 Essential (primary) hypertension: Secondary | ICD-10-CM

## 2017-11-25 LAB — BASIC METABOLIC PANEL
Anion gap: 11 (ref 5–15)
Anion gap: 10 (ref 5–15)
BUN: 11 mg/dL (ref 6–20)
BUN: 16 mg/dL (ref 6–20)
CALCIUM: 8.7 mg/dL — AB (ref 8.9–10.3)
CHLORIDE: 95 mmol/L — AB (ref 101–111)
CHLORIDE: 98 mmol/L — AB (ref 101–111)
CO2: 27 mmol/L (ref 22–32)
CO2: 28 mmol/L (ref 22–32)
CREATININE: 0.86 mg/dL (ref 0.44–1.00)
Calcium: 8.1 mg/dL — ABNORMAL LOW (ref 8.9–10.3)
Creatinine, Ser: 0.99 mg/dL (ref 0.44–1.00)
GFR, EST NON AFRICAN AMERICAN: 57 mL/min — AB (ref >60)
Glucose, Bld: 121 mg/dL — ABNORMAL HIGH (ref 65–99)
Glucose, Bld: 172 mg/dL — ABNORMAL HIGH (ref 65–99)
Potassium: 2.8 mmol/L — ABNORMAL LOW (ref 3.5–5.1)
Potassium: 2.8 mmol/L — ABNORMAL LOW (ref 3.5–5.1)
SODIUM: 133 mmol/L — AB (ref 135–145)
SODIUM: 136 mmol/L (ref 135–145)

## 2017-11-25 LAB — HIV ANTIBODY (ROUTINE TESTING W REFLEX): HIV SCREEN 4TH GENERATION: NONREACTIVE

## 2017-11-25 LAB — I-STAT TROPONIN, ED: TROPONIN I, POC: 0.02 ng/mL (ref 0.00–0.08)

## 2017-11-25 MED ORDER — POTASSIUM CHLORIDE 20 MEQ/15ML (10%) PO SOLN: 40.0000 meq | Freq: Once | ORAL | Status: DC

## 2017-11-25 MED ORDER — POTASSIUM CHLORIDE 20 MEQ/15ML (10%) PO SOLN: 40.0000 meq | Freq: Two times a day (BID) | ORAL | Status: DC

## 2017-11-25 MED ORDER — IPRATROPIUM-ALBUTEROL 0.5-2.5 (3) MG/3ML IN SOLN
3.0000 mL | Freq: Three times a day (TID) | RESPIRATORY_TRACT | Status: DC
  Administered 2017-11-26: 3 mL via RESPIRATORY_TRACT
  Filled 2017-11-25: qty 3

## 2017-11-25 MED ORDER — IPRATROPIUM BROMIDE 0.02 % IN SOLN
0.5000 mg | Freq: Once | RESPIRATORY_TRACT | Status: AC
  Administered 2017-11-25: 0.5 mg via RESPIRATORY_TRACT
  Filled 2017-11-25: qty 2.5

## 2017-11-25 MED ORDER — POTASSIUM CHLORIDE 10 MEQ/100ML IV SOLN
10.0000 meq | INTRAVENOUS | Status: DC
  Administered 2017-11-25: 10 meq via INTRAVENOUS
  Filled 2017-11-25: qty 100

## 2017-11-25 MED ORDER — ALBUTEROL SULFATE (2.5 MG/3ML) 0.083% IN NEBU: 2.5000 mg | INHALATION_SOLUTION | RESPIRATORY_TRACT | Status: DC | PRN

## 2017-11-25 MED ORDER — FUROSEMIDE 10 MG/ML IJ SOLN
40.0000 mg | Freq: Once | INTRAMUSCULAR | Status: AC
  Administered 2017-11-25: 40 mg via INTRAVENOUS
  Filled 2017-11-25: qty 4

## 2017-11-25 MED ORDER — PHENOL 1.4 % MT LIQD
1.0000 | OROMUCOSAL | Status: DC | PRN
  Filled 2017-11-25: qty 177

## 2017-11-25 MED ORDER — ORAL CARE MOUTH RINSE: 15.0000 mL | Freq: Two times a day (BID) | OROMUCOSAL | Status: DC

## 2017-11-25 MED ORDER — ALBUTEROL SULFATE (2.5 MG/3ML) 0.083% IN NEBU: 5.0000 mg | INHALATION_SOLUTION | Freq: Once | RESPIRATORY_TRACT | Status: DC

## 2017-11-25 MED ORDER — FLUTICASONE PROPIONATE 50 MCG/ACT NA SUSP
2.0000 | Freq: Every day | NASAL | Status: DC
  Administered 2017-11-25 – 2017-11-26 (×2): 2 via NASAL
  Filled 2017-11-25: qty 16

## 2017-11-25 MED ORDER — IPRATROPIUM-ALBUTEROL 0.5-2.5 (3) MG/3ML IN SOLN
3.0000 mL | Freq: Four times a day (QID) | RESPIRATORY_TRACT | Status: DC
  Administered 2017-11-25 (×4): 3 mL via RESPIRATORY_TRACT
  Filled 2017-11-25 (×3): qty 3

## 2017-11-25 MED ORDER — POTASSIUM CHLORIDE 10 MEQ/100ML IV SOLN
INTRAVENOUS | Status: AC
  Filled 2017-11-25: qty 100

## 2017-11-25 MED ORDER — POTASSIUM CHLORIDE 20 MEQ/15ML (10%) PO SOLN
40.0000 meq | Freq: Two times a day (BID) | ORAL | Status: DC
  Administered 2017-11-25: 40 meq via ORAL
  Filled 2017-11-25: qty 30

## 2017-11-25 MED ORDER — LORATADINE 10 MG PO TABS
10.0000 mg | ORAL_TABLET | Freq: Every day | ORAL | Status: DC
  Administered 2017-11-25 – 2017-11-26 (×2): 10 mg via ORAL
  Filled 2017-11-25 (×2): qty 1

## 2017-11-25 MED ORDER — METOPROLOL SUCCINATE 12.5 MG HALF TABLET
12.5000 mg | ORAL_TABLET | Freq: Every day | ORAL | Status: DC
  Administered 2017-11-25 – 2017-11-26 (×2): 12.5 mg via ORAL
  Filled 2017-11-25 (×2): qty 1

## 2017-11-25 MED ORDER — POTASSIUM CHLORIDE CRYS ER 20 MEQ PO TBCR
40.0000 meq | EXTENDED_RELEASE_TABLET | Freq: Once | ORAL | Status: AC
  Administered 2017-11-25: 40 meq via ORAL
  Filled 2017-11-25: qty 2

## 2017-11-25 MED ORDER — ACETAMINOPHEN 325 MG PO TABS
650.0000 mg | ORAL_TABLET | Freq: Four times a day (QID) | ORAL | Status: DC | PRN
  Administered 2017-11-25: 650 mg via ORAL
  Filled 2017-11-25: qty 2

## 2017-11-25 MED ORDER — SODIUM CHLORIDE 0.9% FLUSH
3.0000 mL | Freq: Two times a day (BID) | INTRAVENOUS | Status: DC
  Administered 2017-11-25 – 2017-11-26 (×3): 3 mL via INTRAVENOUS

## 2017-11-25 MED ORDER — AZITHROMYCIN 250 MG PO TABS
250.0000 mg | ORAL_TABLET | Freq: Every day | ORAL | Status: DC
  Administered 2017-11-26: 250 mg via ORAL
  Filled 2017-11-25: qty 1

## 2017-11-25 MED ORDER — SIMVASTATIN 20 MG PO TABS
20.0000 mg | ORAL_TABLET | Freq: Every day | ORAL | Status: DC
  Administered 2017-11-25 – 2017-11-26 (×2): 20 mg via ORAL
  Filled 2017-11-25 (×2): qty 1

## 2017-11-25 MED ORDER — ACETAMINOPHEN 650 MG RE SUPP: 650.0000 mg | Freq: Four times a day (QID) | RECTAL | Status: DC | PRN

## 2017-11-25 MED ORDER — PREDNISONE 10 MG PO TABS
50.0000 mg | ORAL_TABLET | Freq: Every day | ORAL | Status: DC
  Administered 2017-11-25 – 2017-11-26 (×2): 50 mg via ORAL
  Filled 2017-11-25 (×2): qty 2

## 2017-11-25 MED ORDER — POTASSIUM CHLORIDE CRYS ER 20 MEQ PO TBCR: 40.0000 meq | EXTENDED_RELEASE_TABLET | Freq: Two times a day (BID) | ORAL | Status: DC

## 2017-11-25 MED ORDER — BENZONATATE 100 MG PO CAPS
200.0000 mg | ORAL_CAPSULE | Freq: Three times a day (TID) | ORAL | Status: DC | PRN
  Administered 2017-11-26: 200 mg via ORAL
  Filled 2017-11-25: qty 2

## 2017-11-25 MED ORDER — WARFARIN SODIUM 3 MG PO TABS
6.0000 mg | ORAL_TABLET | Freq: Every day | ORAL | Status: DC
  Administered 2017-11-25: 6 mg via ORAL
  Filled 2017-11-25: qty 2

## 2017-11-25 MED ORDER — POTASSIUM CHLORIDE CRYS ER 20 MEQ PO TBCR
40.0000 meq | EXTENDED_RELEASE_TABLET | Freq: Two times a day (BID) | ORAL | Status: DC
  Administered 2017-11-25: 40 meq via ORAL
  Filled 2017-11-25: qty 2

## 2017-11-25 MED ORDER — SODIUM CHLORIDE 0.9 % IV SOLN
1.0000 g | Freq: Once | INTRAVENOUS | Status: AC
  Administered 2017-11-25: 1 g via INTRAVENOUS
  Filled 2017-11-25: qty 10

## 2017-11-25 MED ORDER — AZITHROMYCIN 250 MG PO TABS
500.0000 mg | ORAL_TABLET | Freq: Every day | ORAL | Status: AC
  Administered 2017-11-25: 500 mg via ORAL
  Filled 2017-11-25: qty 2

## 2017-11-25 MED ORDER — WARFARIN - PHARMACIST DOSING INPATIENT: Freq: Every day | Status: DC

## 2017-11-25 MED ORDER — METHYLPREDNISOLONE SODIUM SUCC 125 MG IJ SOLR
125.0000 mg | Freq: Once | INTRAMUSCULAR | Status: AC
  Administered 2017-11-25: 125 mg via INTRAVENOUS
  Filled 2017-11-25: qty 2

## 2017-11-25 MED ORDER — GUAIFENESIN-DM 100-10 MG/5ML PO SYRP
5.0000 mL | ORAL_SOLUTION | ORAL | Status: DC | PRN
  Administered 2017-11-25 (×2): 5 mL via ORAL
  Filled 2017-11-25 (×2): qty 5

## 2017-11-25 MED ORDER — ALBUTEROL SULFATE (2.5 MG/3ML) 0.083% IN NEBU
5.0000 mg | INHALATION_SOLUTION | Freq: Once | RESPIRATORY_TRACT | Status: AC
  Administered 2017-11-25: 5 mg via RESPIRATORY_TRACT
  Filled 2017-11-25: qty 6

## 2017-11-25 NOTE — Care Management Note (Signed)
Case Management Note  Patient Details  Name: Kim Anthony [name]: [id number] Date of Birth: June 26, 1950  Subjective/Objective:  From home, presents with copd ex, sob, on iv abx, iv solumedrol.                   Action/Plan: DC home when ready.  Expected Discharge Date:                  Expected Discharge Plan:  Home/Self Care  In-House Referral:     Discharge planning Services  CM Consult  Post Acute Care Choice:    Choice offered to:     DME Arranged:    DME Agency:     HH Arranged:    HH Agency:     Status of Service:  In process, will continue to follow  If discussed at Long Length of Stay Meetings, dates discussed:    Additional Comments:  Zenon Mayo, RN 11/25/2017, 12:47 PM

## 2017-11-25 NOTE — H&P (Signed)
Date: 11/25/2017               Patient Name:  Kim Anthony [name]: [id number]  DOB: 03/28/1951 Age / Sex: 67 y.o., female   PCP: Jule Ser, DO         Medical Service: Internal Medicine Teaching Service         Attending Physician: Dr. Annia Belt, MD    First Contact: Dr. Vickki Muff Pager: [phone]  Second Contact: Dr. Ledell Noss Pager: [phone]       After Hours (After 5p/  First Contact Pager: [phone]  weekends / holidays): Second Contact Pager: [phone]   Chief Complaint: Cough, shortness of breath  History of Present Illness:  Kim Anthony is a 67 yo with PMH of combined systolic and diastolic HF (LVEF 36-14% with G2DD 06/2015), HTN, mitral valve replacement, COPD and current tobacco use who is presenting for evaluation of cough. History obtained directly from the patient. Patient states she was in usual state of health until about 5 days prior to admission when she noticed the onset of shortness of breath with exertion and productive cough with green sputum. Patient symptoms have gradually worsened since they first appeared and she now experiences intermittent SOB at rest. Patient's SOB has been relieved somewhat with use of PRN albuterol inhaler at home. Prior to the onset of these symptoms patient normally does not use this inhaler at all, but since symptoms started she uses it 2 or 3 times daily with good result. Patient's ongoing cough has caused her to develop a sore throat and patient noticed some streaks of bright red blood sputum the day of presentation. Subjective chills. No fevers. Patient also denies chest pain, lower extremity swelling, abdominal pain, change in bowel/bladder habits, and recent sick contacts. Patient states she has been taking all of her usual medications without difficulty and has not missed any doses of her daily lasix.    Upon arrival to the ED patient was afebrile, non-tachycardic, normotensive, and saturating 88% on room air, with increase  to >92% on 2L [place]. CMP with Na 134, K 3.0 and Cr of 1.17 (baseline 0.7). CBC with WBC 13.4 and Hgb 11.8 (baseline 11). BNP = 74.0. INR = 2.69. Istat troponin 0.01, with repeat 0.02. EKG showed normal sinus rhythm. Chest x ray showed stable cardiomegaly with increased interstitial prominence. Patient was given IV solumedrol, ipratropium-albuterol treatments, IV ceftriaxone, and IV lasix 40 mg. IMTS was called for admission  Meds:  Current Meds  Medication Sig  . cetirizine (ZYRTEC) 10 MG tablet Take 1 tablet (10 mg total) by mouth daily.  . diclofenac sodium (VOLTAREN) 1 % GEL Apply 2 g topically 3 (three) times daily as needed.  . fluticasone (FLONASE) 50 MCG/ACT nasal spray Place 2 sprays into both nostrils daily.  . furosemide (LASIX) 40 MG tablet Take 1 tablet (40 mg total) by mouth daily.  [name] gabapentin (NEURONTIN) 600 MG tablet Take 600 mg by mouth 3 (three) times daily.  [name] lisinopril (PRINIVIL,ZESTRIL) 10 MG tablet Take 1 tablet (10 mg total) by mouth daily.  . metoprolol succinate (TOPROL XL) 25 MG 24 hr tablet Take 0.5 tablets (12.5 mg total) by mouth daily.  . potassium chloride 20 MEQ/15ML (10%) SOLN Take 30 mLs (40 mEq total) by mouth 2 (two) times daily for 3 days, THEN 30 mLs (40 mEq total) daily.  . simvastatin (ZOCOR) 20 MG tablet Take 1 tablet (20 mg total) by mouth daily.  [name] tiZANidine (ZANAFLEX)  4 MG tablet Take 4 mg by mouth 3 (three) times daily.  [name] warfarin (COUMADIN) 6 MG tablet TAKE 1 TABLET BY MOUTH ONCE DAILY  . [DISCONTINUED] HYDROcodone-acetaminophen (NORCO/VICODIN) 5-325 MG tablet Take 1-2 tablets by mouth every 6 hours as needed for pain and/or cough.  . [DISCONTINUED] lidocaine (XYLOCAINE) 2 % solution Use as directed 15 mLs in the mouth or throat as needed for mouth pain.   Allergies: Allergies as of 11/24/2017 - Review Complete 11/24/2017  Allergen Reaction Noted  . Acetaminophen-codeine Itching 07/10/2015  . Other Itching 07/10/2015  . Tramadol Itching  07/10/2015   Past Medical History: Past Medical History:  Diagnosis Date  . Breast cancer ([place])    Left Breast Cancer  . CHF (congestive heart failure) ([place])   . COPD (chronic obstructive pulmonary disease) ([place])   . Coronary artery disease   . History of mitral valve replacement with mechanical valve   . HLD (hyperlipidemia)   . Hypertension   . Pulmonary embolism (HCC)    Family History:  Family History  Problem Relation Age of Onset  . Heart attack Unknown   . Breast cancer Maternal Aunt 48   Social History: Patient current everyday smoker, 1 pack per week. NO alcohol or illicit drugs.   Review of Systems: A complete ROS was negative except as per HPI.   Physical Exam: Blood pressure 108/70, pulse 87, temperature 98 F (36.7 C), temperature source Oral, resp. rate 20, height 5\' 5"  (1.651 m), weight 175 lb (79.4 kg), SpO2 91 %.  Physical Exam  Constitutional: She appears well-developed and well-nourished. No distress.  HENT:  Mouth/Throat: Oropharynx is clear and moist. No oropharyngeal exudate.  Eyes: Conjunctivae and EOM are normal. No scleral icterus.  Cardiovascular: Normal rate, regular rhythm and intact distal pulses.  Murmur (Systolic murmur with click) heard. Respiratory:  Patient without [place] in place, saturating >92% on room air. Patient speaking in full sentences. No accessory muscle use or nasal flaring. Intermittent wheezing throughout bilateral lung fields. No crackles or consolidation appreciated.  GI: Soft. She exhibits no distension. There is no tenderness. There is no rebound.  Musculoskeletal: She exhibits no edema (of bilateral lower extremities) or tenderness (of bilateral lower extremities).  Lymphadenopathy:    She has no cervical adenopathy.  Skin: Skin is warm and dry. No rash noted. She is not diaphoretic. No erythema.  Psychiatric: She has a normal mood and affect. Her behavior is normal.   EKG: personally reviewed my interpretation is sinus  rhythm without ST elevation to suggest ischemia. TWI present in V2, present on EKG from 01/2017.   CXR: personally reviewed my interpretation is increased interstitial prominence without pleural effusions or vascular congestion to suggest volume overload. Mild cardiomegaly.  Assessment & Plan by Problem: Principal Problem:   COPD exacerbation ([place]) Active Problems:   Acute kidney injury ([place])  Kim Anthony is a 67 yo with PMH of combined systolic and diastolic HF (LVEF 40-98% with G2DD 06/2015), HTN, mitral valve replacement, COPD and current tobacco use who presented with signs/symptoms of COPD exacerbation. Patient admitted to internal medicine teaching service for management. The specific problems addressed during admission are as follows:  COPD exacerbation: Patient presenting with progressive SOB, cough with increased green sputum production, and increased rescue inhaler use. Chest imaging with interstitial prominence and PE with diffuse wheezing. Altogether consistent with COPD exacerbation. Patient initially hypoxic on arrival, but responded well to IV steroids, antibiotics, and scheduled breathing treatments. Patient with current leukocytosis to 13.4, likely  2/2 IV solumedrol, but afebrile. Will continue to monitor on standard treatment for COPD exacerbation.  -Admit to Med-Surg -Switch to azithromycin 500 mg followed by 250 mg x4 days -Start prednisone 50 mg x5 days -Scheduled DuoNebs -Continue home fluticasone and loratadine   AKI: Creatinine on admission elevated to 1.17 with baseline 0.7 per chart review. Patient reports feeling unwell with decreased PO intake in days leading up to admission. Suspect mild AKI prerenal given this history. Unfortunately patient got IV lasix 40 mg in ED, which may exacerbate AKI given that patient already appears dehydrated on exam. Reluctant to give fluids at this time given history of HF but will encourage PO intake for now, hold home lasix, and  nephrotoxic agents.  -Encourage PO intake -Hold home lasix, lisinopril -Repeat BMP in AM  Combined systolic and diastolic HF (LVEF 91-66% with G2DD 06/2015) w/ mechanical mitral valve repair: Patient appears more clinically dehydrated rather than volume overloaded (without crackles and peripheral edema on exam). Suspect patient's hypoxia 2/2 COPD exacerbation given significant improvement with breathing treatments/steroids initiated in ED. Will continue to monitor for signs/symptoms of volume overload during admission. Patient's INR therapeutic on admission, will continue to monitor this as well.  -Continue home metoprolol XL 12.5 mg -Hold home lasix, lisinopril in setting of AKI -Warfarin per pharmacy  FEN/GI: -Heart Healthy diet  -No IVF, replace with 80 mEq K+  VTE Prophylaxis: Warfarin Code Status: Full  Dispo: Admit patient to Observation with expected length of stay less than 2 midnights.  SignedThomasene Ripple, MD 11/25/2017, 3:59 AM  Pager: [phone]

## 2017-11-25 NOTE — Progress Notes (Addendum)
   Subjective: Pt reports improvement in breathing today, she had no fevers or chills overnight.   Objective:  Vital signs in last 24 hours: Vitals:   11/25/17 0500 11/25/17 0816 11/25/17 1519 11/25/17 1604  BP:  107/73  (!) 107/57  Pulse:  91  91  Resp:  18  16  Temp:  97.7 F (36.5 C)  97.6 F (36.4 C)  TempSrc:  Oral  Oral  SpO2: 98% 97% 96% 94%  Weight:      Height:       Physical Exam  Constitutional: No distress.  Neck: No JVD present.  Cardiovascular: Normal rate, regular rhythm and normal heart sounds. Exam reveals no gallop and no friction rub.  No murmur heard. Pulmonary/Chest: Effort normal and breath sounds normal. No respiratory distress. She has no wheezes. She has no rales. She exhibits no tenderness.  Abdominal: Soft. Bowel sounds are normal. She exhibits no distension and no mass. There is no tenderness. There is no rebound and no guarding.  Musculoskeletal: She exhibits edema (trace LE edema bilaterally).  Neurological: She is alert.  Skin: She is not diaphoretic.   BMP Latest Ref Rng & Units 11/25/2017 11/24/2017 11/09/2017  Glucose 65 - 99 mg/dL 121(H) 100(H) 95  BUN 6 - 20 mg/dL 11 11 8   Creatinine 0.44 - 1.00 mg/dL 0.99 1.17(H) 0.71  BUN/Creat Ratio 12 - 28 - - 11(L)  Sodium 135 - 145 mmol/L 133(L) 134(L) 140  Potassium 3.5 - 5.1 mmol/L 2.8(L) 3.0(L) 3.1(L)  Chloride 101 - 111 mmol/L 95(L) 95(L) 95(L)  CO2 22 - 32 mmol/L 27 26 28   Calcium 8.9 - 10.3 mg/dL 8.1(L) 8.4(L) 8.7    Assessment/Plan:  Principal Problem:   COPD exacerbation (HCC) Active Problems:   Acute kidney injury (HCC)   Acute bronchitis   S/P MVR (mitral valve replacement)   Chronic anticoagulation   HTN (hypertension), benign    Acute on chronic COPD: Patient presenting with progressive SOB, cough with increased green sputum production, wheezing and increased rescue inhaler use. No focal infiltrate, responding well to COPD exacerbation therapy  -azithromycin 500 mg followed by  250 mg x4 days -prednisone 50 mg x5 days -Scheduled DuoNebs -Continue home fluticasone and loratadine    AKI: Creatinine on admission elevated to 1.17 with baseline 0.7 per chart review.  Suspect mild AKI prerenal given poor oral intake. Received lasix in ED but repeat bmp HD 1 with improvement  -restart home lasix tomorrow   Combined systolic and diastolic HF (LVEF 17-91% with G2DD 06/2015) w/ mechanical mitral valve repair: COPD seems to be driver of dyspnea but will monitor heart failure closely in setting of steroid use. Patient's INR therapeutic on admission, will continue to monitor this as well.   -Continue home metoprolol XL 12.5 mg -restart lasix tomorrow -Warfarin per pharmacy   Dispo: Anticipated discharge later this evening vs tomorrow  Katherine Roan, MD 11/25/2017, 4:26 PM Vickki Muff MD PGY-1 Internal Medicine Pager # [phone]

## 2017-11-25 NOTE — Progress Notes (Signed)
ANTICOAGULATION CONSULT NOTE - Initial Consult  Pharmacy Consult for Warfarin  Indication: Mechanical mitral valve, hx of PE  Allergies  Allergen Reactions  . Acetaminophen-Codeine Itching  . Other Itching    Darvocet  . Tramadol Itching    vomitting   Patient Measurements: Height: 5\' 5"  (165.1 cm) Weight: 175 lb (79.4 kg) IBW/kg (Calculated) : 57  Vital Signs: Temp: 98 F (36.7 C) (06/05 2242) Temp Source: Oral (06/05 2242) BP: 108/70 (06/06 0300) Pulse Rate: 87 (06/06 0200)  Labs: Recent Labs    11/24/17 1948  HGB 11.8*  HCT 37.3  PLT 444*  LABPROT 28.4*  INR 2.69  CREATININE 1.17*    Estimated Creatinine Clearance: 47.9 mL/min (A) (by C-G formula based on SCr of 1.17 mg/dL (H)).   Medical History: Past Medical History:  Diagnosis Date  . Breast cancer ([place])    Left Breast Cancer  . CHF (congestive heart failure) ([place])   . COPD (chronic obstructive pulmonary disease) ([place])   . Coronary artery disease   . History of mitral valve replacement with mechanical valve   . HLD (hyperlipidemia)   . Hypertension   . Pulmonary embolism [facility])     Assessment: 67 y/o F on warfarin PTA for mechanical mitral valve and hx of PE, presents to the ED with respiratory issues, INR is therapeutic at 2.69, Hgb 11.8, warfarin PTA dose 6 mg daily  Goal of Therapy:  INR 2.5-3.5 Monitor platelets by anticoagulation protocol: Yes   Plan:  Warfarin 6 mg daily (home regimen) Daily PT/INR Monitor for bleeding   Narda Bonds 11/25/2017,3:52 AM

## 2017-11-25 NOTE — ED Provider Notes (Signed)
[facility] EMERGENCY DEPARTMENT Provider Note   CSN: [id number] Arrival date & time: 11/24/17  1813     History   Chief Complaint Chief Complaint  Patient presents with  . Cough    HPI Kim Anthony is a 67 y.o. female.  The history is provided by the patient.  Cough  This is a new problem. The current episode started more than 1 week ago. The problem occurs constantly. The problem has not changed since onset.The cough is productive of sputum. There has been no fever. Associated symptoms include shortness of breath and wheezing. Pertinent negatives include no chest pain, no sweats and no weight loss. Associated symptoms comments: Leg swelling. She has tried nothing for the symptoms. The treatment provided no relief. Her past medical history is significant for COPD.    Past Medical History:  Diagnosis Date  . Breast cancer ([place])    Left Breast Cancer  . CHF (congestive heart failure) ([place])   . COPD (chronic obstructive pulmonary disease) ([place])   . Coronary artery disease   . History of mitral valve replacement with mechanical valve   . HLD (hyperlipidemia)   . Hypertension   . Pulmonary embolism [facility])     Patient Active Problem List   Diagnosis Date Noted  . Hx of breast cancer 05/27/2017  . Muscle spasm 02/25/2017  . Soft tissue mass 02/25/2017  . Tobacco use disorder 02/25/2017  . Lymphadenopathy, mediastinal 02/25/2017  . History of mitral valve replacement with mechanical valve 11/18/2016  . Essential hypertension 02/01/2016  . Allergic rhinitis 02/01/2016  . Healthcare maintenance 01/30/2016  . Chronic low back pain 07/22/2015  . Pulmonary embolism ([place]) 07/11/2015  . Chronic combined systolic and diastolic congestive heart failure ([place]) 07/11/2015  . Headache, common migraine 07/11/2015  . HLD (hyperlipidemia) 07/11/2015  . Hypokalemia 07/11/2015    Past Surgical History:  Procedure Laterality Date  . MASTECTOMY Left 2000s   for breast  cancer, also s/p radiation and chemo  . MECHANICAL AORTIC AND MITRAL VALVE REPLACEMENT       OB History   None      Home Medications    Prior to Admission medications   Medication Sig Start Date End Date Taking? Authorizing Provider  cetirizine (ZYRTEC) 10 MG tablet Take 1 tablet (10 mg total) by mouth daily. 05/27/17  Yes Jule Ser, DO  diclofenac sodium (VOLTAREN) 1 % GEL Apply 2 g topically 3 (three) times daily as needed. 02/25/17  Yes Alphonzo Grieve, MD  fluticasone (FLONASE) 50 MCG/ACT nasal spray Place 2 sprays into both nostrils daily. 12/03/16 12/03/17 Yes Jule Ser, DO  furosemide (LASIX) 40 MG tablet Take 1 tablet (40 mg total) by mouth daily. 05/27/17  Yes Jule Ser, DO  gabapentin (NEURONTIN) 600 MG tablet Take 600 mg by mouth 3 (three) times daily. 09/23/17  Yes [provider]  HYDROcodone-acetaminophen (NORCO/VICODIN) 5-325 MG tablet Take 1-2 tablets by mouth every 6 hours as needed for pain and/or cough. 08/25/17  Yes Pisciotta, Elmyra Ricks, PA-C  lidocaine (XYLOCAINE) 2 % solution Use as directed 15 mLs in the mouth or throat as needed for mouth pain. 06/22/17  Yes Glyn Ade, PA-C  lisinopril (PRINIVIL,ZESTRIL) 10 MG tablet Take 1 tablet (10 mg total) by mouth daily. 05/27/17  Yes Jule Ser, DO  metoprolol succinate (TOPROL XL) 25 MG 24 hr tablet Take 0.5 tablets (12.5 mg total) by mouth daily. 05/27/17  Yes Jule Ser, DO  potassium chloride 20 MEQ/15ML (10%) SOLN Take 30  mLs (40 mEq total) by mouth 2 (two) times daily for 3 days, THEN 30 mLs (40 mEq total) daily. 11/10/17 02/11/18 Yes Katherine Roan, MD  simvastatin (ZOCOR) 20 MG tablet Take 1 tablet (20 mg total) by mouth daily. 05/27/17  Yes Jule Ser, DO  tiZANidine (ZANAFLEX) 4 MG tablet Take 4 mg by mouth 3 (three) times daily. 09/23/17  Yes [provider]  warfarin (COUMADIN) 6 MG tablet TAKE 1 TABLET BY MOUTH ONCE DAILY 09/23/17  Yes Jule Ser, DO  Cetirizine HCl  (ZYRTEC ALLERGY) 10 MG CAPS Take 1 capsule (10 mg total) by mouth daily. Patient not taking: Reported on 11/24/2017 06/22/17   Glyn Ade, PA-C  nicotine (NICODERM CQ - DOSED IN MG/24 HOURS) 14 mg/24hr patch Place 1 patch (14 mg total) onto the skin daily. Patient not taking: Reported on 11/24/2017 02/25/17 02/25/18  Alphonzo Grieve, MD    Family History Family History  Problem Relation Age of Onset  . Heart attack Unknown   . Breast cancer Maternal Aunt 18    Social History Social History   Tobacco Use  . Smoking status: Current Every Day Smoker    Packs/day: 0.10    Types: Cigarettes  . Smokeless tobacco: Never Used  . Tobacco comment: 1 pack per week  Substance Use Topics  . Alcohol use: No    Comment: beer  . Drug use: No     Allergies   Acetaminophen-codeine; Other; and Tramadol   Review of Systems Review of Systems  Constitutional: Negative for diaphoresis, fever and weight loss.  Respiratory: Positive for cough, shortness of breath and wheezing.   Cardiovascular: Negative for chest pain and palpitations.  Gastrointestinal: Negative for abdominal pain.  All other systems reviewed and are negative.    Physical Exam Updated Vital Signs BP 113/75 (BP Location: Right Arm)   Pulse 93   Temp 98 F (36.7 C) (Oral)   Resp 20   Ht 5\' 5"  (1.651 m)   Wt 79.4 kg (175 lb)   SpO2 95%   BMI 29.12 kg/m   Physical Exam  Constitutional: She is oriented to person, place, and time. She appears well-developed and well-nourished.  HENT:  Head: Normocephalic and atraumatic.  Mouth/Throat: No oropharyngeal exudate.  Eyes: Pupils are equal, round, and reactive to light. Conjunctivae are normal.  Neck: Normal range of motion. Neck supple. No JVD present.  Cardiovascular: Normal rate, regular rhythm, normal heart sounds and intact distal pulses.  Pulmonary/Chest: She has wheezes.  Abdominal: Soft. Bowel sounds are normal. She exhibits no mass. There is no tenderness. There  is no rebound and no guarding.  Musculoskeletal: Normal range of motion. She exhibits edema.  Neurological: She is alert and oriented to person, place, and time. She displays normal reflexes.  Skin: Skin is warm and dry. Capillary refill takes less than 2 seconds.  Psychiatric: She has a normal mood and affect.     ED Treatments / Results  Labs (all labs ordered are listed, but only abnormal results are displayed) Results for orders placed or performed during the hospital encounter of 11/24/17  CBC with Differential  Result Value Ref Range   WBC 13.4 (H) 4.0 - 10.5 K/uL   RBC 3.82 (L) 3.87 - 5.11 MIL/uL   Hemoglobin 11.8 (L) 12.0 - 15.0 g/dL   HCT 37.3 36.0 - 46.0 %   MCV 97.6 78.0 - 100.0 fL   MCH 30.9 26.0 - 34.0 pg   MCHC 31.6 30.0 - 36.0 g/dL  RDW 11.8 11.5 - 15.5 %   Platelets 444 (H) 150 - 400 K/uL   Neutrophils Relative % 72 %   Neutro Abs 9.9 (H) 1.7 - 7.7 K/uL   Lymphocytes Relative 19 %   Lymphs Abs 2.5 0.7 - 4.0 K/uL   Monocytes Relative 6 %   Monocytes Absolute 0.8 0.1 - 1.0 K/uL   Eosinophils Relative 1 %   Eosinophils Absolute 0.1 0.0 - 0.7 K/uL   Basophils Relative 1 %   Basophils Absolute 0.1 0.0 - 0.1 K/uL   Immature Granulocytes 1 %   Abs Immature Granulocytes 0.1 0.0 - 0.1 K/uL  Comprehensive metabolic panel  Result Value Ref Range   Sodium 134 (L) 135 - 145 mmol/L   Potassium 3.0 (L) 3.5 - 5.1 mmol/L   Chloride 95 (L) 101 - 111 mmol/L   CO2 26 22 - 32 mmol/L   Glucose, Bld 100 (H) 65 - 99 mg/dL   BUN 11 6 - 20 mg/dL   Creatinine, Ser 1.17 (H) 0.44 - 1.00 mg/dL   Calcium 8.4 (L) 8.9 - 10.3 mg/dL   Total Protein 8.5 (H) 6.5 - 8.1 g/dL   Albumin 3.5 3.5 - 5.0 g/dL   AST 26 15 - 41 U/L   ALT 15 14 - 54 U/L   Alkaline Phosphatase 82 38 - 126 U/L   Total Bilirubin 1.4 (H) 0.3 - 1.2 mg/dL   GFR calc non Af Amer 47 (L) >60 mL/min   GFR calc Af Amer 54 (L) >60 mL/min   Anion gap 13 5 - 15  Brain natriuretic peptide  Result Value Ref Range   B  Natriuretic Peptide 74.0 0.0 - 100.0 pg/mL  Protime-INR  Result Value Ref Range   Prothrombin Time 28.4 (H) 11.4 - 15.2 seconds   INR 2.69   I-Stat Troponin, ED (not at [facility])  Result Value Ref Range   Troponin i, poc 0.01 0.00 - 0.08 ng/mL   Comment 3           Dg Chest 2 View  Result Date: 11/24/2017 CLINICAL DATA:  Hemoptysis and shortness of breath today. History of COPD, CHF, pulmonary embolism, breast cancer. EXAM: CHEST - 2 VIEW COMPARISON:  Chest radiograph August 10, 2017 FINDINGS: Stable cardiomegaly. Status post median sternotomy for cardiac valve replacement. Similar mild interstitial prominence. No pleural effusion. Biapical pleural thickening. No pneumothorax. Status post left mastectomy with surgical clips left axilla. Osteopenia. IMPRESSION: Stable cardiomegaly. Similar mild interstitial prominence, possible pulmonary edema without focal consolidation. Electronically Signed   By: Elon Alas M.D.   On: 11/24/2017 20:13    EKG EKG Interpretation  Date/Time:  Wednesday November 24 2017 19:35:02 EDT Ventricular Rate:  100 PR Interval:  144 QRS Duration: 108 QT Interval:  408 QTC Calculation: 526 R Axis:   -9 Text Interpretation:  Sinus rhythm with Fusion complexes ST & T wave abnormality, consider lateral ischemia Prolonged QT no change Confirmed by Dory Horn) on 11/24/2017 11:33:07 PM   Radiology Dg Chest 2 View  Result Date: 11/24/2017 CLINICAL DATA:  Hemoptysis and shortness of breath today. History of COPD, CHF, pulmonary embolism, breast cancer. EXAM: CHEST - 2 VIEW COMPARISON:  Chest radiograph August 10, 2017 FINDINGS: Stable cardiomegaly. Status post median sternotomy for cardiac valve replacement. Similar mild interstitial prominence. No pleural effusion. Biapical pleural thickening. No pneumothorax. Status post left mastectomy with surgical clips left axilla. Osteopenia. IMPRESSION: Stable cardiomegaly. Similar mild interstitial prominence, possible  pulmonary edema without focal consolidation.  Electronically Signed   By: Elon Alas M.D.   On: 11/24/2017 20:13    Procedures Procedures (including critical care time)  Medications Ordered in ED Medications  albuterol (PROVENTIL) (2.5 MG/3ML) 0.083% nebulizer solution 5 mg (has no administration in time range)  albuterol (PROVENTIL) (2.5 MG/3ML) 0.083% nebulizer solution 5 mg (5 mg Nebulization Given 11/24/17 1924)  methylPREDNISolone sodium succinate (SOLU-MEDROL) 125 mg/2 mL injection 125 mg (125 mg Intravenous Given 11/25/17 0045)  furosemide (LASIX) injection 40 mg (40 mg Intravenous Given 11/25/17 0045)  albuterol (PROVENTIL) (2.5 MG/3ML) 0.083% nebulizer solution 5 mg (5 mg Nebulization Given 11/25/17 0045)  ipratropium (ATROVENT) nebulizer solution 0.5 mg (0.5 mg Nebulization Given 11/25/17 0045)  cefTRIAXone (ROCEPHIN) 1 g in sodium chloride 0.9 % 100 mL IVPB (0 g Intravenous Stopped 11/25/17 0141)     Final Clinical Impressions(s) / ED Diagnoses   Will admit to medicine for continued hypoxia.     Eriverto Byrnes, MD 11/25/17 0300

## 2017-11-25 NOTE — Discharge Summary (Signed)
Name: Kim Anthony [name]: [id number] DOB: September 12, 1950 67 y.o. PCP: Jule Ser, DO  Date of Admission: 11/24/2017  7:16 PM Date of Discharge: 11/26/2017 Attending Physician: Murriel Hopper Discharge Diagnosis:  Acute on chronic COPD  Discharge Medications: Allergies as of 11/26/2017      Reactions   Acetaminophen-codeine Itching   Other Itching   Darvocet   Tramadol Itching   vomitting      Medication List    TAKE these medications   albuterol (2.5 MG/3ML) 0.083% nebulizer solution Commonly known as:  PROVENTIL Take 3 mLs (2.5 mg total) by nebulization every 6 (six) hours as needed for wheezing or shortness of breath.   azithromycin 250 MG tablet Commonly known as:  ZITHROMAX Take 1 tablet (250 mg total) by mouth daily for 3 days.   cetirizine 10 MG tablet Commonly known as:  ZYRTEC Take 1 tablet (10 mg total) by mouth daily.   diclofenac sodium 1 % Gel Commonly known as:  VOLTAREN Apply 2 g topically 3 (three) times daily as needed.   fluticasone 50 MCG/ACT nasal spray Commonly known as:  FLONASE Place 2 sprays into both nostrils daily.   furosemide 40 MG tablet Commonly known as:  LASIX Take 1 tablet (40 mg total) by mouth daily.   gabapentin 600 MG tablet Commonly known as:  NEURONTIN Take 600 mg by mouth 3 (three) times daily.   lisinopril 10 MG tablet Commonly known as:  PRINIVIL,ZESTRIL Take 1 tablet (10 mg total) by mouth daily.   metoprolol succinate 25 MG 24 hr tablet Commonly known as:  TOPROL XL Take 0.5 tablets (12.5 mg total) by mouth daily.   potassium chloride 20 MEQ/15ML (10%) Soln 40 meq twice daily for 3 days followed by 23meq daily What changed:  See the new instructions.   predniSONE 20 MG tablet Commonly known as:  DELTASONE Take 2 tablets (40 mg total) by mouth daily with breakfast.   simvastatin 20 MG tablet Commonly known as:  ZOCOR Take 1 tablet (20 mg total) by mouth daily.   tiZANidine 4 MG tablet Commonly known as:   ZANAFLEX Take 4 mg by mouth 3 (three) times daily.   warfarin 6 MG tablet Commonly known as:  COUMADIN Take as directed. If you are unsure how to take this medication, talk to your nurse or doctor. Original instructions:  TAKE 1 TABLET BY MOUTH ONCE DAILY       Disposition and follow-up:   [name] was discharged from [facility] in Good condition.  At the hospital follow up visit please address:  Acute on chronic COPD  -ensure pt finished prednisone and azithro, obtained anoro inhaler and is using it daily  Combined systolic and diastolic CHF  -check volume status and renal function adjust diuretic dose as necessary -ensure pt going to INR checks and warfarin adjustments  2.  Labs / imaging needed at time of follow-up: INR,  bmp  3.  Pending labs/ test needing follow-up: none  Follow-up Appointments: Follow-up Information    Jule Ser, DO Follow up in 4 day(s).   Specialty:  Internal Medicine Why:  please follow up with your pcp in 4 days Contact information: [place] [phone]           Hospital Course by problem list:  Patient arrived with progressive shortness of breath, cough productive of green sputum, wheezing increased rescue inhaler use.  During her work-up she was found to have a negative BNP  negative troponins, chest x-ray with no focal infiltrates consistent with possible mild pulmonary edema.  Her shortness of breath was thought to be primarily COPD exacerbation driven.  She responded well to standard COPD therapy was given azithromycin, prednisone scheduled nebulizers.  As the patient was out of her home Advair she was transitioned to [facility] for maintenance therapy.  She has a nebulizer machine at home as well, as she was out of her refills for this I sent a prescription for albuterol nebulizer solution as well.  At no point during her status she appears volume overloaded.  Infection is somewhat  dry on exam due to poor oral intake and had a mild AKI that resolved with holding her diuretics.  As her oral intake improved she was transitioned back to her home dose of Lasix on discharge.  Her INR is remained within goal during her visit.  Discharge Vitals:   BP 124/79   Pulse 92   Temp 97.7 F (36.5 C) (Oral)   Resp 18   Ht 5\' 5"  (1.651 m)   Wt 164 lb 0.4 oz (74.4 kg)   SpO2 97%   BMI 27.29 kg/m   Pertinent Labs, Studies, and Procedures:  CBC Latest Ref Rng & Units 11/26/2017 11/24/2017 11/09/2017  WBC 4.0 - 10.5 K/uL 18.5(H) 13.4(H) 8.1  Hemoglobin 12.0 - 15.0 g/dL 11.4(L) 11.8(L) 12.6  Hematocrit 36.0 - 46.0 % 36.5 37.3 38.3  Platelets 150 - 400 K/uL 433(H) 444(H) 428   BMP Latest Ref Rng & Units 11/26/2017 11/25/2017 11/25/2017  Glucose 65 - 99 mg/dL 97 172(H) 121(H)  BUN 6 - 20 mg/dL 17 16 11   Creatinine 0.44 - 1.00 mg/dL 0.76 0.86 0.99  BUN/Creat Ratio 12 - 28 - - -  Sodium 135 - 145 mmol/L 137 136 133(L)  Potassium 3.5 - 5.1 mmol/L 3.0(L) 2.8(L) 2.8(L)  Chloride 101 - 111 mmol/L 101 98(L) 95(L)  CO2 22 - 32 mmol/L 29 28 27   Calcium 8.9 - 10.3 mg/dL 8.6(L) 8.7(L) 8.1(L)   BNP (last 3 results) Recent Labs    11/24/17 1948  BNP 74.0   Troponin 0.01, 0.02  Lab Results  Component Value Date   INR 2.59 11/26/2017   INR 2.69 11/24/2017   INR 3.8 (A) 11/09/2017   ProBNP (last 3 results) No results for input(s): PROBNP in the last 8760 hours. CLINICAL DATA:  Hemoptysis and shortness of breath today. History of COPD, CHF, pulmonary embolism, breast cancer.   EXAM: CHEST - 2 VIEW   COMPARISON:  Chest radiograph August 10, 2017   FINDINGS: Stable cardiomegaly. Status post median sternotomy for cardiac valve replacement. Similar mild interstitial prominence. No pleural effusion. Biapical pleural thickening. No pneumothorax. Status post left mastectomy with surgical clips left axilla. Osteopenia.   IMPRESSION: Stable cardiomegaly. Similar mild interstitial  prominence, possible pulmonary edema without focal consolidation.     Electronically Signed   By: Elon Alas M.D.   On: 11/24/2017 20:13     Discharge Instructions: Discharge Instructions    Call MD for:   Complete by:  As directed    Call MD for:  difficulty breathing, headache or visual disturbances   Complete by:  As directed    Call MD for:  extreme fatigue   Complete by:  As directed    Call MD for:  hives   Complete by:  As directed    Call MD for:  persistant dizziness or light-headedness   Complete by:  As directed    Call  MD for:  persistant nausea and vomiting   Complete by:  As directed    Call MD for:  redness, tenderness, or signs of infection (pain, swelling, redness, odor or green/yellow discharge around incision site)   Complete by:  As directed    Call MD for:  severe uncontrolled pain   Complete by:  As directed    Call MD for:  temperature >100.4   Complete by:  As directed    Diet - low sodium heart healthy   Complete by:  As directed    Discharge instructions   Complete by:  As directed    [name] you had an exacerbation of your copd.  Please take you new medications as prescribed and refill your potassium. You have an appointment in our clinic on Tuesday June 18th.  You will need blood work taken at that time.   Increase activity slowly   Complete by:  As directed       Signed: Katherine Roan, MD 11/28/2017, 7:10 AM

## 2017-11-26 ENCOUNTER — Other Ambulatory Visit: Payer: Self-pay | Admitting: Pharmacist

## 2017-11-26 ENCOUNTER — Other Ambulatory Visit: Payer: Medicare Other

## 2017-11-26 ENCOUNTER — Other Ambulatory Visit: Payer: Self-pay | Admitting: Internal Medicine

## 2017-11-26 DIAGNOSIS — Z885 Allergy status to narcotic agent status: Secondary | ICD-10-CM | POA: Diagnosis not present

## 2017-11-26 DIAGNOSIS — J441 Chronic obstructive pulmonary disease with (acute) exacerbation: Secondary | ICD-10-CM

## 2017-11-26 DIAGNOSIS — I5042 Chronic combined systolic (congestive) and diastolic (congestive) heart failure: Secondary | ICD-10-CM | POA: Diagnosis not present

## 2017-11-26 DIAGNOSIS — N179 Acute kidney failure, unspecified: Secondary | ICD-10-CM | POA: Diagnosis not present

## 2017-11-26 DIAGNOSIS — Z888 Allergy status to other drugs, medicaments and biological substances status: Secondary | ICD-10-CM | POA: Diagnosis not present

## 2017-11-26 LAB — CBC
HEMATOCRIT: 36.5 % (ref 36.0–46.0)
Hemoglobin: 11.4 g/dL — ABNORMAL LOW (ref 12.0–15.0)
MCH: 31.3 pg (ref 26.0–34.0)
MCHC: 31.2 g/dL (ref 30.0–36.0)
MCV: 100.3 fL — AB (ref 78.0–100.0)
PLATELETS: 433 10*3/uL — AB (ref 150–400)
RBC: 3.64 MIL/uL — ABNORMAL LOW (ref 3.87–5.11)
RDW: 11.7 % (ref 11.5–15.5)
WBC: 18.5 10*3/uL — AB (ref 4.0–10.5)

## 2017-11-26 LAB — BASIC METABOLIC PANEL
Anion gap: 7 (ref 5–15)
BUN: 17 mg/dL (ref 6–20)
CHLORIDE: 101 mmol/L (ref 101–111)
CO2: 29 mmol/L (ref 22–32)
CREATININE: 0.76 mg/dL (ref 0.44–1.00)
Calcium: 8.6 mg/dL — ABNORMAL LOW (ref 8.9–10.3)
GFR calc Af Amer: >60 mL/min (ref >60)
GFR calc non Af Amer: >60 mL/min (ref >60)
GLUCOSE: 97 mg/dL (ref 65–99)
Potassium: 3 mmol/L — ABNORMAL LOW (ref 3.5–5.1)
SODIUM: 137 mmol/L (ref 135–145)

## 2017-11-26 LAB — PROTIME-INR
INR: 2.59
Prothrombin Time: 27.6 seconds — ABNORMAL HIGH (ref 11.4–15.2)

## 2017-11-26 LAB — MAGNESIUM: Magnesium: 1.6 mg/dL — ABNORMAL LOW (ref 1.7–2.4)

## 2017-11-26 MED ORDER — AZITHROMYCIN 250 MG PO TABS: 250.0000 mg | ORAL_TABLET | Freq: Every day | ORAL | 0 refills | Status: AC

## 2017-11-26 MED ORDER — PREDNISONE 20 MG PO TABS: 40.0000 mg | ORAL_TABLET | Freq: Every day | ORAL | 0 refills | Status: DC

## 2017-11-26 MED ORDER — MAGNESIUM CHLORIDE 64 MG PO TBEC
1.0000 | DELAYED_RELEASE_TABLET | Freq: Once | ORAL | Status: AC
Start: 2017-11-26 — End: 2017-11-26
  Administered 2017-11-26: 64 mg via ORAL
  Filled 2017-11-26: qty 1

## 2017-11-26 MED ORDER — POTASSIUM CHLORIDE 20 MEQ/15ML (10%) PO SOLN: ORAL | 2 refills | Status: DC

## 2017-11-26 MED ORDER — MAGNESIUM SULFATE 2 GM/50ML IV SOLN
2.0000 g | Freq: Once | INTRAVENOUS | Status: DC
  Filled 2017-11-26: qty 50

## 2017-11-26 MED ORDER — POTASSIUM CHLORIDE CRYS ER 20 MEQ PO TBCR
40.0000 meq | EXTENDED_RELEASE_TABLET | Freq: Two times a day (BID) | ORAL | Status: DC
  Administered 2017-11-26: 40 meq via ORAL
  Filled 2017-11-26: qty 2

## 2017-11-26 MED ORDER — UMECLIDINIUM-VILANTEROL 62.5-25 MCG/INH IN AEPB: 1.0000 | INHALATION_SPRAY | Freq: Every day | RESPIRATORY_TRACT | 3 refills | Status: DC

## 2017-11-26 MED ORDER — FLUTICASONE FUROATE-VILANTEROL 100-25 MCG/INH IN AEPB: 1.0000 | INHALATION_SPRAY | Freq: Every day | RESPIRATORY_TRACT | 0 refills | Status: DC

## 2017-11-26 MED ORDER — ALBUTEROL SULFATE (2.5 MG/3ML) 0.083% IN NEBU: 2.5000 mg | INHALATION_SOLUTION | Freq: Four times a day (QID) | RESPIRATORY_TRACT | 12 refills | Status: DC | PRN

## 2017-11-26 MED ORDER — FLUTICASONE FUROATE-VILANTEROL 100-25 MCG/INH IN AEPB: 1.0000 | INHALATION_SPRAY | Freq: Every day | RESPIRATORY_TRACT | 3 refills | Status: DC

## 2017-11-26 NOTE — Progress Notes (Signed)
RN Regino Schultze states patient no longer needs PIV access due to medication being changed to po

## 2017-11-26 NOTE — Progress Notes (Signed)
Medicine attending discharge note: I personally examined this patient on the day of discharge and I attest to the accuracy of the discharge evaluation and plan as recorded in the final progress note by resident physician Dr. Guadlupe Spanish.  Pleasant 67 year old woman with smoking related obstructive airway disease, history of combined systolic and diastolic heart failure, status post mechanical mitral valve repair are not chronic warfarin anticoagulation, admitted on June 5 with a 5-day history of productive cough and subjective fever.  Blood-streaked sputum.  She was afebrile.  Scattered bilateral wheezes and crackles on initial exam.  Mild leukocytosis white count 13,000 with 72% neutrophils.  Chest x-ray with stable cardiomegaly but no acute infiltrate or effusion.  She had a rapid stabilization with steroids, oxygen, and.  Bronchodilators.  She received initial doses of parenteral Rocephin and azithromycin in the emergency department.  She was transition to oral steroids and azithromycin.  Warfarin levels were therapeutic. She was hypokalemic.  Nadir potassium 2.8.  Potassium at discharge 3.0 and additional potassium was given. HIV screen nonreactive.  Disposition: Condition stable at time of discharge Follow-up in our internal medicine clinic There were no complications

## 2017-11-26 NOTE — Progress Notes (Signed)
Insurance would not pay without copd code in script

## 2017-11-26 NOTE — Progress Notes (Signed)
   Subjective: Pt with continued improvement, no wheezing or sob, dressed in street clothes anxious to get home.  Objective:  Vital signs in last 24 hours: Vitals:   11/25/17 2025 11/26/17 0021 11/26/17 0804 11/26/17 0854  BP:  122/75  124/79  Pulse:  98  92  Resp:  18  18  Temp:  (!) 97.5 F (36.4 C)  97.7 F (36.5 C)  TempSrc:  Oral  Oral  SpO2: 96% 100% 93% 97%  Weight:      Height:       Physical Exam  Constitutional: No distress.  Neck: No JVD present.  Cardiovascular: Normal rate, regular rhythm and normal heart sounds. Exam reveals no gallop and no friction rub.  No murmur heard. Pulmonary/Chest: Effort normal and breath sounds normal. No respiratory distress. She has no wheezes. She has no rales. She exhibits no tenderness.  Abdominal: Soft. Bowel sounds are normal. She exhibits no distension and no mass. There is no tenderness. There is no rebound and no guarding.  Musculoskeletal: She exhibits no edema.  Neurological: She is alert.  Skin: She is not diaphoretic.   BMP Latest Ref Rng & Units 11/26/2017 11/25/2017 11/25/2017  Glucose 65 - 99 mg/dL 97 172(H) 121(H)  BUN 6 - 20 mg/dL 17 16 11   Creatinine 0.44 - 1.00 mg/dL 0.76 0.86 0.99  BUN/Creat Ratio 12 - 28 - - -  Sodium 135 - 145 mmol/L 137 136 133(L)  Potassium 3.5 - 5.1 mmol/L 3.0(L) 2.8(L) 2.8(L)  Chloride 101 - 111 mmol/L 101 98(L) 95(L)  CO2 22 - 32 mmol/L 29 28 27   Calcium 8.9 - 10.3 mg/dL 8.6(L) 8.7(L) 8.1(L)    Assessment/Plan:  Principal Problem:   COPD exacerbation (HCC) Active Problems:   Acute kidney injury ([place])   Acute bronchitis   S/P MVR (mitral valve replacement)   Chronic anticoagulation   HTN (hypertension), benign    Acute on chronic COPD: Patient presenting with progressive SOB, cough with increased green sputum production, wheezing and increased rescue inhaler use. No focal infiltrate, responding well to COPD exacerbation therapy  -azithromycin 500 mg followed by 250 mg x4  days -prednisone 50 mg x5 days -Scheduled DuoNebs -Continue home fluticasone and loratadine    AKI: Creatinine on admission elevated to 1.17 with baseline 0.7 per chart review.  Suspect mild AKI prerenal given poor oral intake. Received lasix in ED but repeat bmp HD 1 with improvement  -restart home lasix tomorrow -repleating mag and potassium orally   Combined systolic and diastolic HF (LVEF 35-46% with G2DD 06/2015) w/ mechanical mitral valve repair: COPD seems to be driver of dyspnea but will monitor heart failure closely in setting of steroid use. Patient's INR therapeutic on admission, will continue to monitor this as well.   -Continue home metoprolol XL 12.5 mg -restart lasix tomorrow -Warfarin per pharmacy   Dispo: Anticipated discharge this am  Katherine Roan, MD 11/26/2017, 11:00 AM Vickki Muff MD PGY-1 Internal Medicine Pager # [phone]

## 2017-11-26 NOTE — Progress Notes (Signed)
Switched from [employer] (ICS/LABA) to Anoro (LAMA/LABA) per Dr. Shan Levans. Pharmacy and patient notified.

## 2017-11-26 NOTE — Consult Note (Signed)
[facility] CM Primary Care Navigator  11/26/2017  Treyana Hard 1950/08/02 [id number]   Went to seepatient at the bedside to identify possible discharge needs but she was alreadydischargehome per staff report.   PerMD note,patient wasseen for productive cough, subjective fevers, and became concerned when she started to see blood in her nasal mucus and blood streaks in her sputum. She was admitted and treated for acute bronchitis with mild exacerbation of COPD).  Primary care provider's office is listed as providing transition of care (TOC) follow-up.  Providers from primary care physician's office was following patient during this admission.  Patient has discharge instruction to follow-up with primary care provider in 4 days.   For additional questions please contact:  Karin Golden A. Sedra Morfin, BSN, RN-BC [facility] PRIMARY CARE Navigator Cell: [phone]

## 2017-11-30 ENCOUNTER — Ambulatory Visit: Payer: Medicare Other

## 2017-12-03 ENCOUNTER — Ambulatory Visit: Payer: Medicare Other

## 2017-12-07 ENCOUNTER — Ambulatory Visit (INDEPENDENT_AMBULATORY_CARE_PROVIDER_SITE_OTHER): Payer: Medicare Other | Admitting: Internal Medicine

## 2017-12-07 ENCOUNTER — Ambulatory Visit: Payer: Medicare Other | Admitting: Pharmacist

## 2017-12-07 ENCOUNTER — Ambulatory Visit: Admission: RE | Admit: 2017-12-07 | Payer: Medicare Other | Source: Ambulatory Visit

## 2017-12-07 ENCOUNTER — Ambulatory Visit
Admission: RE | Admit: 2017-12-07 | Discharge: 2017-12-07 | Disposition: A | Discharge status: Home / Self Care | Payer: Medicare Other | Source: Ambulatory Visit | Attending: Internal Medicine | Admitting: Internal Medicine

## 2017-12-07 ENCOUNTER — Ambulatory Visit (HOSPITAL_COMMUNITY)
Admission: RE | Admit: 2017-12-07 | Discharge: 2017-12-07 | Disposition: A | Discharge status: Home / Self Care | Payer: Medicare Other | Source: Ambulatory Visit | Attending: Internal Medicine | Admitting: Internal Medicine

## 2017-12-07 ENCOUNTER — Other Ambulatory Visit: Payer: Self-pay

## 2017-12-07 ENCOUNTER — Other Ambulatory Visit: Payer: Self-pay | Admitting: Internal Medicine

## 2017-12-07 VITALS — BP 94/67 | HR 89 | Temp 98.9°F | Ht 65.0 in | Wt 160.5 lb

## 2017-12-07 DIAGNOSIS — Z952 Presence of prosthetic heart valve: Secondary | ICD-10-CM

## 2017-12-07 DIAGNOSIS — E785 Hyperlipidemia, unspecified: Secondary | ICD-10-CM | POA: Diagnosis not present

## 2017-12-07 DIAGNOSIS — I361 Nonrheumatic tricuspid (valve) insufficiency: Secondary | ICD-10-CM | POA: Insufficient documentation

## 2017-12-07 DIAGNOSIS — I2699 Other pulmonary embolism without acute cor pulmonale: Secondary | ICD-10-CM | POA: Diagnosis not present

## 2017-12-07 DIAGNOSIS — R928 Other abnormal and inconclusive findings on diagnostic imaging of breast: Secondary | ICD-10-CM | POA: Diagnosis not present

## 2017-12-07 DIAGNOSIS — J449 Chronic obstructive pulmonary disease, unspecified: Secondary | ICD-10-CM | POA: Insufficient documentation

## 2017-12-07 DIAGNOSIS — I110 Hypertensive heart disease with heart failure: Secondary | ICD-10-CM | POA: Insufficient documentation

## 2017-12-07 DIAGNOSIS — I5042 Chronic combined systolic (congestive) and diastolic (congestive) heart failure: Secondary | ICD-10-CM | POA: Diagnosis not present

## 2017-12-07 DIAGNOSIS — E876 Hypokalemia: Secondary | ICD-10-CM

## 2017-12-07 DIAGNOSIS — N641 Fat necrosis of breast: Secondary | ICD-10-CM

## 2017-12-07 LAB — POCT INR: INR: 4.7 — AB (ref 2.0–3.0)

## 2017-12-07 MED ORDER — ALBUTEROL SULFATE HFA 108 (90 BASE) MCG/ACT IN AERS: 1.0000 | INHALATION_SPRAY | Freq: Four times a day (QID) | RESPIRATORY_TRACT | 2 refills | Status: DC | PRN

## 2017-12-07 NOTE — Progress Notes (Addendum)
CC: COPD  HPI:  [name] is a 67 y.o. female with chronic combined systolic and diastolic CHF, s/p mechanical MV replacement on coumadin, HTN, COPD, Hx of PE, and tobacco use who presents for hospital follow up after admission for AECOPD.  Please see problem based charting for status of patient's chronic medical issues.  COPD (chronic obstructive pulmonary disease) ([place]) She was recently admitted from 6/5-6/7 for suspected COPD exacerbation. She was initially treated with steroids, bronchodilators, IV ceftriaxone and azithromycin.  She had quick improvement and was transitioned to oral prednisone and azithromycin on discharge which she has completed.  Her maintenance inhaler was changed to Anoro Ellipta 62.5-25 mcg 1 puff daily to which she reports adherence.  She says she has had in her breathing and has not needed to use her albuterol nebulizer since discharge.  She does not have a PRN albuterol rescue inhaler.  She has a continued cough with slight yellow sputum production.  She does continue to smoke with 1 pack lasting her 3 or 4 days. A/P: Patient with presumed COPD presenting for hospital follow-up after an acute exacerbation.  She has improved significantly and is currently stable.  She will continue her current medications.  I have ordered PFTs to better clarify her lung disease. -Continue Anoro Ellipta 62.5-25 mcg 1 puff daily -Prescribed as needed albuterol inhaler  S/P MVR (mitral valve replacement) Patient has a history of mechanical mitral valve replacement and is on chronic anticoagulation with Coumadin.  She had previously followed in our clinic but had multiple no-shows.  She reports adherence to her Coumadin 6 mg daily and denies any obvious bleeding. Her last INR was 2.59 on 11/26/2017. A/P: Patient on chronic anticoagulation with Coumadin for mechanical mitral valve replacement.  Repeat INR is above goal at 4.7.  Discussed with our clinical pharmacy team, she will hold her  Coumadin dose for 1 day and follow-up with the Coumadin clinic in 1 week for a recheck.  Her goal INR is 2.5-3.5.  Hypokalemia She was noted to be hypokalemic during her recent admission with last potassium level of 3.0 on day of discharge.  She was prescribed potassium solution however says she is currently taking potassium tablet 40 mEq 3 times a day. A/P: Repeat BMP to reassess potassium level.  Will advise patient to adjust potassium supplement based on results.  ADDENDUM: Potassium slightly elevated at 5.4. She is taking KDur 40 mEq three times daily and Lasix 40 mg daily. I discussed the results with her daughter by phone and advised she hold her potassium for 2 days then resume afterwards at 40 mEq once daily. She expressed understanding with repeat back.    Past Medical History:  Diagnosis Date  . Breast cancer ([place])    Left Breast Cancer  . CHF (congestive heart failure) ([place])   . COPD (chronic obstructive pulmonary disease) ([place])   . Coronary artery disease   . History of mitral valve replacement with mechanical valve   . HLD (hyperlipidemia)   . Hypertension   . Pulmonary embolism ([place])    Review of Systems:   Review of Systems  Constitutional: Negative for chills, diaphoresis and fever.  HENT: Negative for nosebleeds.   Respiratory: Negative for hemoptysis, shortness of breath and wheezing.        Occasional cough with scant yellow sputum production  Cardiovascular: Negative for chest pain, palpitations and leg swelling.  Gastrointestinal: Negative for blood in stool and melena.  Genitourinary: Negative for hematuria.  Neurological: Negative  for dizziness and loss of consciousness.     Physical Exam:  Vitals:   12/07/17 0951  BP: 94/67  Pulse: 89  Temp: 98.9 F (37.2 C)  TempSrc: Oral  SpO2: 100%  Weight: 160 lb 8 oz (72.8 kg)  Height: 5\' 5"  (1.651 m)   Physical Exam  Constitutional: She is oriented to person, place, and time. She appears well-developed  and well-nourished. No distress.  HENT:  Head: Normocephalic and atraumatic.  Cardiovascular: Normal rate and regular rhythm.  Murmur heard. Pulmonary/Chest: Effort normal. No respiratory distress. She has no wheezes. She has no rales.  Musculoskeletal: She exhibits no edema.  Neurological: She is alert and oriented to person, place, and time.  Skin: Skin is warm. She is not diaphoretic.     Assessment & Plan:   See Encounters Tab for problem based charting.  Patient discussed with Dr. Lynnae January

## 2017-12-07 NOTE — Patient Instructions (Addendum)
It was a pleasure to see you Kim Anthony.  I am glad you are feeling better. Please continue your Anoro daily. I have prescribed a rescue Albuterol inhaler to use as needed.  I am referring you for lung tests.  Your INR was high at 4.7 today. Please follow up with the coumadin clinic to monitor your INR.  Please call if there are any signs of bleeding.  Follow up with your PCP in 6 months or sooner if needed.

## 2017-12-07 NOTE — Assessment & Plan Note (Signed)
She was recently admitted from 6/5-6/7 for suspected COPD exacerbation. She was initially treated with steroids, bronchodilators, IV ceftriaxone and azithromycin.  She had quick improvement and was transitioned to oral prednisone and azithromycin on discharge which she has completed.  Her maintenance inhaler was changed to Anoro Ellipta 62.5-25 mcg 1 puff daily to which she reports adherence.  She says she has had in her breathing and has not needed to use her albuterol nebulizer since discharge.  She does not have a PRN albuterol rescue inhaler.  She has a continued cough with slight yellow sputum production.  She does continue to smoke with 1 pack lasting her 3 or 4 days. A/P: Patient with presumed COPD presenting for hospital follow-up after an acute exacerbation.  She has improved significantly and is currently stable.  She will continue her current medications.  I have ordered PFTs to better clarify her lung disease. -Continue Anoro Ellipta 62.5-25 mcg 1 puff daily -Prescribed as needed albuterol inhaler

## 2017-12-07 NOTE — Progress Notes (Signed)
Anticoagulation Management Kim Anthony a 67 y.o.femalewho reports to the clinic for monitoring of warfarintreatment.   Indication: history of PE and mechanical mitral valve Duration: indefinite Supervising physician: [facility] Visit History: Patient does notreport signs/symptoms of bleeding or thromboembolism  Anticoagulation Episode Summary    Current INR goal:   2.5-3.5  TTR:   34.1 % (2.3 y)  Next INR check:   12/13/2017  INR from last check:   No new INR was available at last check  Weekly max warfarin dose:     Target end date:     INR check location:   Anticoagulation Clinic  Preferred lab:     Send INR reminders to:      Indications   Pulmonary embolism (HCC) [I26.99]       Comments:          Allergies  Allergen Reactions  . Acetaminophen-Codeine Itching  . Other Itching    Darvocet  . Tramadol Itching    vomitting   Medication Sig  albuterol (PROVENTIL HFA;VENTOLIN HFA) 108 (90 Base) MCG/ACT inhaler Inhale 1-2 puffs into the lungs every 6 (six) hours as needed for wheezing or shortness of breath.  albuterol (PROVENTIL) (2.5 MG/3ML) 0.083% nebulizer solution Take 3 mLs (2.5 mg total) by nebulization every 6 (six) hours as needed for wheezing or shortness of breath.  cetirizine (ZYRTEC) 10 MG tablet Take 1 tablet (10 mg total) by mouth daily.  diclofenac sodium (VOLTAREN) 1 % GEL Apply 2 g topically 3 (three) times daily as needed.  fluticasone (FLONASE) 50 MCG/ACT nasal spray Place 2 sprays into both nostrils daily.  furosemide (LASIX) 40 MG tablet Take 1 tablet (40 mg total) by mouth daily.  gabapentin (NEURONTIN) 600 MG tablet Take 600 mg by mouth 3 (three) times daily.  lisinopril (PRINIVIL,ZESTRIL) 10 MG tablet Take 1 tablet (10 mg total) by mouth daily.  metoprolol succinate (TOPROL XL) 25 MG 24 hr tablet Take 0.5 tablets (12.5 mg total) by mouth daily.  potassium chloride 20 MEQ/15ML (10%) SOLN 40 meq twice daily  for 3 days followed by 41meq daily  simvastatin (ZOCOR) 20 MG tablet Take 1 tablet (20 mg total) by mouth daily.  tiZANidine (ZANAFLEX) 4 MG tablet Take 4 mg by mouth 3 (three) times daily.  umeclidinium-vilanterol (ANORO ELLIPTA) 62.5-25 MCG/INH AEPB Inhale 1 puff into the lungs daily.  warfarin (COUMADIN) 6 MG tablet TAKE 1 TABLET BY MOUTH ONCE DAILY   Past Medical History:  Diagnosis Date  . Breast cancer ([place])    Left Breast Cancer  . CHF (congestive heart failure) ([place])   . COPD (chronic obstructive pulmonary disease) ([place])   . Coronary artery disease   . History of mitral valve replacement with mechanical valve   . HLD (hyperlipidemia)   . Hypertension   . Pulmonary embolism [facility])    Social History   Socioeconomic History  . Marital status: Single    Spouse name: Not on file  . Number of children: Not on file  . Years of education: Not on file  . Highest education level: Not on file  Occupational History  . Not on file  Social Needs  . Financial resource strain: Not on file  . Food insecurity:    Worry: Not on file    Inability: Not on file  . Transportation needs:    Medical: Not on file    Non-medical: Not on file  Tobacco Use  . Smoking status: Current Every Day Smoker  Packs/day: 0.10    Types: Cigarettes  . Smokeless tobacco: Never Used  . Tobacco comment: 1 pack per week  Substance and Sexual Activity  . Alcohol use: No    Comment: beer  . Drug use: No  . Sexual activity: Not on file  Lifestyle  . Physical activity:    Days per week: Not on file    Minutes per session: Not on file  . Stress: Not on file  Relationships  . Social connections:    Talks on phone: Not on file    Gets together: Not on file    Attends religious service: Not on file    Active member of club or organization: Not on file    Attends meetings of clubs or organizations: Not on file    Relationship status: Not on file  Other Topics Concern  . Not on file  Social History  Narrative   ** Merged History Encounter **       Family History  Problem Relation Age of Onset  . Heart attack Unknown   . Breast cancer Maternal Aunt 50   ASSESSMENT Recent Results: Lab Results  Component Value Date   INR 4.7 (A) 12/07/2017   INR 2.59 11/26/2017   INR 2.69 11/24/2017    Anticoagulation Dosing:   INR today: Supratherapeutic  PLAN Weekly dose was decreased by 17% to 35 mg per week (hold 1 dose today)  Patient advised to contact clinic or seek medical attention if signs/symptoms of bleeding or thromboembolism occur.  Patient verbalized understanding by repeating back information and was advised to contact me if further medication-related questions arise. Patient was also provided an information handout.  Follow-up Return in 1 week for INR re-check  Return in 6 months (on 06/08/2018) for f/u for INR/Coumadin Clinic; f/u 6 months for PCP.  [name]

## 2017-12-07 NOTE — Assessment & Plan Note (Addendum)
She was noted to be hypokalemic during her recent admission with last potassium level of 3.0 on day of discharge.  She was prescribed potassium solution however says she is currently taking potassium tablet 40 mEq 3 times a day. A/P: Repeat BMP to reassess potassium level.  Will advise patient to adjust potassium supplement based on results.  ADDENDUM: Potassium slightly elevated at 5.4. She is taking KDur 40 mEq three times daily and Lasix 40 mg daily. I discussed the results with her daughter by phone and advised she hold her potassium for 2 days then resume afterwards at 40 mEq once daily. She expressed understanding with repeat back.

## 2017-12-07 NOTE — Assessment & Plan Note (Signed)
Patient has a history of mechanical mitral valve replacement and is on chronic anticoagulation with Coumadin.  She had previously followed in our clinic but had multiple no-shows.  She reports adherence to her Coumadin 6 mg daily and denies any obvious bleeding. Her last INR was 2.59 on 11/26/2017. A/P: Patient on chronic anticoagulation with Coumadin for mechanical mitral valve replacement.  Repeat INR is above goal at 4.7.  Discussed with our clinical pharmacy team, she will hold her Coumadin dose for 1 day and follow-up with the Coumadin clinic in 1 week for a recheck.  Her goal INR is 2.5-3.5.

## 2017-12-07 NOTE — Progress Notes (Signed)
Internal Medicine Clinic Attending  Case discussed with Dr. Patel at the time of the visit.  We reviewed the resident's history and exam and pertinent patient test results.  I agree with the assessment, diagnosis, and plan of care documented in the resident's note.  

## 2017-12-07 NOTE — Progress Notes (Signed)
Patient was seen today in a co-visit between the physician and pharmacist.  See documentation under Dr. Serita Grit visit for details.

## 2017-12-07 NOTE — Progress Notes (Signed)
  Echocardiogram 2D Echocardiogram has been performed.  Kim Anthony L Androw 12/07/2017, 9:37 AM

## 2017-12-08 ENCOUNTER — Other Ambulatory Visit: Payer: Self-pay | Admitting: Internal Medicine

## 2017-12-08 DIAGNOSIS — M7989 Other specified soft tissue disorders: Secondary | ICD-10-CM

## 2017-12-08 LAB — BMP8+ANION GAP
Anion Gap: 18 mmol/L (ref 10.0–18.0)
BUN / CREAT RATIO: 14 (ref 12–28)
BUN: 10 mg/dL (ref 8–27)
CO2: 19 mmol/L — AB (ref 20–29)
Calcium: 9.7 mg/dL (ref 8.7–10.3)
Chloride: 100 mmol/L (ref 96–106)
Creatinine, Ser: 0.69 mg/dL (ref 0.57–1.00)
GFR calc Af Amer: 103 mL/min/{1.73_m2} (ref >59)
GFR calc non Af Amer: 90 mL/min/{1.73_m2} (ref >59)
Glucose: 80 mg/dL (ref 65–99)
Potassium: 5.4 mmol/L — ABNORMAL HIGH (ref 3.5–5.2)
Sodium: 137 mmol/L (ref 134–144)

## 2017-12-08 MED ORDER — POTASSIUM CHLORIDE 20 MEQ/15ML (10%) PO SOLN: 40.0000 meq | Freq: Every day | ORAL | 2 refills | Status: DC

## 2017-12-08 NOTE — Addendum Note (Signed)
Addended by: Lenore Cordia on: 12/08/2017 08:48 AM   Modules accepted: Orders

## 2017-12-13 ENCOUNTER — Ambulatory Visit: Payer: Medicare Other

## 2017-12-18 ENCOUNTER — Encounter: Payer: Self-pay | Admitting: *Deleted

## 2017-12-22 ENCOUNTER — Ambulatory Visit (HOSPITAL_COMMUNITY)
Admission: RE | Admit: 2017-12-22 | Discharge: 2017-12-22 | Disposition: A | Discharge status: Home / Self Care | Payer: Medicare Other | Source: Ambulatory Visit | Attending: Internal Medicine | Admitting: Internal Medicine

## 2017-12-22 DIAGNOSIS — J449 Chronic obstructive pulmonary disease, unspecified: Secondary | ICD-10-CM | POA: Diagnosis not present

## 2017-12-22 LAB — PULMONARY FUNCTION TEST
DL/VA % pred: 58 %
DL/VA: 2.87 ml/min/mmHg/L
DLCO unc % pred: 29 %
DLCO unc: 7.63 ml/min/mmHg
FEF 25-75 PRE: 1.14 L/s
FEF 25-75 Post: 1.62 L/sec
FEF2575-%CHANGE-POST: 42 %
FEF2575-%Pred-Post: 88 %
FEF2575-%Pred-Pre: 62 %
FEV1-%CHANGE-POST: 5 %
FEV1-%PRED-POST: 79 %
FEV1-%Pred-Pre: 76 %
FEV1-Post: 1.58 L
FEV1-Pre: 1.5 L
FEV1FVC-%Change-Post: -2 %
FEV1FVC-%Pred-Pre: 104 %
FEV6-%Change-Post: 5 %
FEV6-%Pred-Post: 79 %
FEV6-%Pred-Pre: 75 %
FEV6-PRE: 1.83 L
FEV6-Post: 1.93 L
FEV6FVC-%PRED-POST: 104 %
FEV6FVC-%PRED-PRE: 104 %
FVC-%CHANGE-POST: 7 %
FVC-%PRED-POST: 77 %
FVC-%Pred-Pre: 72 %
FVC-Post: 1.98 L
FVC-Pre: 1.83 L
PRE FEV6/FVC RATIO: 100 %
Post FEV1/FVC ratio: 80 %
Post FEV6/FVC ratio: 100 %
Pre FEV1/FVC ratio: 82 %

## 2017-12-22 MED ORDER — ALBUTEROL SULFATE (2.5 MG/3ML) 0.083% IN NEBU
2.5000 mg | INHALATION_SOLUTION | Freq: Once | RESPIRATORY_TRACT | Status: AC
  Administered 2017-12-22: 2.5 mg via RESPIRATORY_TRACT

## 2017-12-27 ENCOUNTER — Other Ambulatory Visit: Payer: Self-pay | Admitting: Internal Medicine

## 2017-12-30 ENCOUNTER — Encounter: Payer: Medicare Other | Admitting: Internal Medicine

## 2017-12-30 ENCOUNTER — Encounter: Payer: Self-pay | Admitting: Internal Medicine

## 2017-12-31 ENCOUNTER — Ambulatory Visit
Admission: RE | Admit: 2017-12-31 | Discharge: 2017-12-31 | Disposition: A | Discharge status: Home / Self Care | Payer: Medicare Other | Source: Ambulatory Visit | Attending: Internal Medicine | Admitting: Internal Medicine

## 2017-12-31 DIAGNOSIS — Z780 Asymptomatic menopausal state: Secondary | ICD-10-CM | POA: Diagnosis not present

## 2017-12-31 DIAGNOSIS — M8589 Other specified disorders of bone density and structure, multiple sites: Secondary | ICD-10-CM | POA: Diagnosis not present

## 2017-12-31 DIAGNOSIS — E2839 Other primary ovarian failure: Secondary | ICD-10-CM

## 2018-01-05 ENCOUNTER — Other Ambulatory Visit: Payer: Self-pay | Admitting: Internal Medicine

## 2018-01-05 DIAGNOSIS — I2699 Other pulmonary embolism without acute cor pulmonale: Secondary | ICD-10-CM

## 2018-01-06 DIAGNOSIS — M659 Synovitis and tenosynovitis, unspecified: Secondary | ICD-10-CM | POA: Diagnosis not present

## 2018-01-06 DIAGNOSIS — M21621 Bunionette of right foot: Secondary | ICD-10-CM | POA: Diagnosis not present

## 2018-01-06 DIAGNOSIS — M21622 Bunionette of left foot: Secondary | ICD-10-CM | POA: Diagnosis not present

## 2018-01-06 DIAGNOSIS — L600 Ingrowing nail: Secondary | ICD-10-CM | POA: Diagnosis not present

## 2018-01-07 ENCOUNTER — Telehealth: Payer: Self-pay | Admitting: Internal Medicine

## 2018-01-07 ENCOUNTER — Encounter: Payer: Self-pay | Admitting: Internal Medicine

## 2018-01-07 DIAGNOSIS — M8580 Other specified disorders of bone density and structure, unspecified site: Secondary | ICD-10-CM

## 2018-01-07 MED ORDER — CALCIUM CITRATE-VITAMIN D 315-200 MG-UNIT PO TABS
2.0000 | ORAL_TABLET | Freq: Two times a day (BID) | ORAL | 2 refills | Status: DC
Start: 2018-01-07 — End: 2018-06-03

## 2018-01-07 NOTE — Telephone Encounter (Signed)
Spoke with patient. States last dose of warfarin 6 mg was 01/05/2018. Has been out for 2 days. Scheduled first appt that patient has transportation for (01/12/2018 at 0900). [place] will mail form for Medicaid transportation going forward as patient's daughter is moving to [place]. Hubbard Hartshorn, RN, BSN

## 2018-01-07 NOTE — Telephone Encounter (Signed)
Discussed pts osteopenic bone scan results.  Started pt on calcium and vit D

## 2018-01-07 NOTE — Telephone Encounter (Signed)
Is she completely out, she needs an appointment for INR check.  And she is supposed to be on 5mg  tablets.  Any way she can come in today if Dr. Maudie Mercury is available?

## 2018-01-07 NOTE — Telephone Encounter (Signed)
I sent in an Rx to pharmacy on file for 5mg  tablets.  It is probably good that she was out for a couple of days in case her INR was still high or going up.  Have her pick up the tablets today and start tomorrow.  Have her come to the clinic as planned on 7/24 for an INR check.  If Dr. Maudie Mercury is not available that day, put her in [facility] please.    Thank you!

## 2018-01-07 NOTE — Telephone Encounter (Signed)
Patient notified change to warfarin 5 mg and to start tomorrow. Will keep appt on 01/12/2018 at 0900. Patient very appreciative. Hubbard Hartshorn, RN, BSN

## 2018-01-12 ENCOUNTER — Ambulatory Visit (INDEPENDENT_AMBULATORY_CARE_PROVIDER_SITE_OTHER): Payer: Medicare Other | Admitting: Pharmacist

## 2018-01-12 DIAGNOSIS — Z5181 Encounter for therapeutic drug level monitoring: Secondary | ICD-10-CM

## 2018-01-12 DIAGNOSIS — Z7901 Long term (current) use of anticoagulants: Secondary | ICD-10-CM

## 2018-01-12 DIAGNOSIS — I2699 Other pulmonary embolism without acute cor pulmonale: Secondary | ICD-10-CM | POA: Diagnosis not present

## 2018-01-12 DIAGNOSIS — M659 Synovitis and tenosynovitis, unspecified: Secondary | ICD-10-CM | POA: Diagnosis not present

## 2018-01-12 LAB — POCT INR: INR: 1.2 — AB (ref 2.0–3.0)

## 2018-01-12 MED ORDER — WARFARIN SODIUM 5 MG PO TABS: 5.0000 mg | ORAL_TABLET | Freq: Every day | ORAL | 0 refills | Status: DC

## 2018-01-12 MED ORDER — ENOXAPARIN SODIUM 120 MG/0.8ML ~~LOC~~ SOLN: 120.0000 mg | SUBCUTANEOUS | 0 refills | Status: DC

## 2018-01-12 NOTE — Patient Instructions (Signed)
Patient educated about medication as defined in this encounter and verbalized understanding by repeating back instructions provided.   

## 2018-01-12 NOTE — Progress Notes (Signed)
Anticoagulation Management Kim Anthony is a 67 y.o. female who reports to the clinic for monitoring of warfarin treatment.    Indication: history of PE and mechanical mitral valve Duration: indefinite Supervising physician: [facility] Visit History: Patient does not report signs/symptoms of bleeding or thromboembolism. Patient reports no diet, medication, or lifestyle changes.  Anticoagulation Episode Summary    Current INR goal:   2.5-3.5  TTR:   33.9 % (2.4 y)  Next INR check:   12/13/2017  INR from last check:   No new INR was available at last check  Most recent INR:    1.2! (01/12/2018)  Weekly max warfarin dose:     Target end date:     INR check location:   Anticoagulation Clinic  Preferred lab:     Send INR reminders to:      Indications   Pulmonary embolism (HCC) [I26.99]       Comments:           Allergies  Allergen Reactions  . Acetaminophen-Codeine Itching  . Other Itching    Darvocet  . Tramadol Itching    vomitting   Prior to Admission medications   Medication Sig Start Date End Date Taking? Authorizing Provider  albuterol (PROVENTIL HFA;VENTOLIN HFA) 108 (90 Base) MCG/ACT inhaler Inhale 1-2 puffs into the lungs every 6 (six) hours as needed for wheezing or shortness of breath. 12/07/17   Zada Finders, MD  albuterol (PROVENTIL) (2.5 MG/3ML) 0.083% nebulizer solution Take 3 mLs (2.5 mg total) by nebulization every 6 (six) hours as needed for wheezing or shortness of breath. 11/26/17   Katherine Roan, MD  calcium citrate-vitamin D (CITRACAL+D) 315-200 MG-UNIT tablet Take 2 tablets by mouth 2 (two) times daily. 01/07/18 07/06/18  Katherine Roan, MD  cetirizine (ZYRTEC) 10 MG tablet Take 1 tablet (10 mg total) by mouth daily. 05/27/17   Jule Ser, DO  diclofenac sodium (VOLTAREN) 1 % GEL Apply 2 g topically 3 (three) times daily as needed. 02/25/17   Alphonzo Grieve, MD  fluticasone (FLONASE) 50 MCG/ACT nasal spray Place 2 sprays  into both nostrils daily. 12/03/16 12/03/17  Jule Ser, DO  furosemide (LASIX) 40 MG tablet TAKE 1 TABLET BY MOUTH ONCE DAILY 12/27/17   Sid Falcon, MD  gabapentin (NEURONTIN) 600 MG tablet Take 600 mg by mouth 3 (three) times daily. 09/23/17   [provider]  lisinopril (PRINIVIL,ZESTRIL) 10 MG tablet Take 1 tablet (10 mg total) by mouth daily. 05/27/17   Jule Ser, DO  metoprolol succinate (TOPROL XL) 25 MG 24 hr tablet Take 0.5 tablets (12.5 mg total) by mouth daily. 05/27/17   Jule Ser, DO  potassium chloride 20 MEQ/15ML (10%) SOLN Take 30 mLs (40 mEq total) by mouth daily. 12/08/17   Zada Finders, MD  simvastatin (ZOCOR) 20 MG tablet Take 1 tablet (20 mg total) by mouth daily. 05/27/17   Jule Ser, DO  tiZANidine (ZANAFLEX) 4 MG tablet Take 4 mg by mouth 3 (three) times daily. 09/23/17   [provider]  umeclidinium-vilanterol (ANORO ELLIPTA) 62.5-25 MCG/INH AEPB Inhale 1 puff into the lungs daily. 11/26/17   Katherine Roan, MD  warfarin (COUMADIN) 5 MG tablet Take 1 tablet (5 mg total) by mouth daily. 01/07/18   Sid Falcon, MD   Past Medical History:  Diagnosis Date  . Breast cancer ([place])    Left Breast Cancer  . CHF (congestive heart failure) ([place])   . COPD (chronic obstructive pulmonary disease) (  [place])   . Coronary artery disease   . History of mitral valve replacement with mechanical valve   . HLD (hyperlipidemia)   . Hypertension   . Pulmonary embolism [facility])    Social History   Socioeconomic History  . Marital status: Single    Spouse name: Not on file  . Number of children: Not on file  . Years of education: Not on file  . Highest education level: Not on file  Occupational History  . Not on file  Social Needs  . Financial resource strain: Not on file  . Food insecurity:    Worry: Not on file    Inability: Not on file  . Transportation needs:    Medical: Not on file    Non-medical: Not on file  Tobacco Use  . Smoking  status: Current Every Day Smoker    Packs/day: 0.10    Types: Cigarettes  . Smokeless tobacco: Never Used  . Tobacco comment: 1 pack per week  Substance and Sexual Activity  . Alcohol use: No    Comment: beer  . Drug use: No  . Sexual activity: Not on file  Lifestyle  . Physical activity:    Days per week: Not on file    Minutes per session: Not on file  . Stress: Not on file  Relationships  . Social connections:    Talks on phone: Not on file    Gets together: Not on file    Attends religious service: Not on file    Active member of club or organization: Not on file    Attends meetings of clubs or organizations: Not on file    Relationship status: Not on file  Other Topics Concern  . Not on file  Social History Narrative   ** Merged History Encounter **       Family History  Problem Relation Age of Onset  . Heart attack Unknown   . Breast cancer Maternal Aunt 50    ASSESSMENT Recent Results: The most recent result is correlated with 30 mg per week:  Lab Results  Component Value Date   INR 1.2 (A) 01/12/2018   INR 4.7 (A) 12/07/2017   INR 2.59 11/26/2017    Anticoagulation Dosing: Held 1 day then 5 mg daily    INR today: Subtherapeutic  PLAN Take 7.5 mg (1 and 1/2 tablets) on Wednesdays and Fridays, and take 5 mg (1 tablet) on all other days of the week.  Weekly dose was increased by 33% to 40 mg per week.  Start enoxaparin 120 mg SQ injection every 24 hours to bridge until INR therapeutic.   Patient Instructions  Patient educated about medication as defined in this encounter and verbalized understanding by repeating back instructions provided.    Patient educated about signs of stroke and VTE for which to monitor. Patient advised to contact clinic or seek medical attention if signs/symptoms of bleeding or thromboembolism occur. Patient was also provided an information handout.  Follow-up Return Friday, August 2nd for repeat INR check.   Oralia Manis, PharmD candidate

## 2018-01-13 MED ORDER — ENOXAPARIN SODIUM 120 MG/0.8ML ~~LOC~~ SOLN: 120.0000 mg | SUBCUTANEOUS | 0 refills | Status: DC

## 2018-01-13 NOTE — Progress Notes (Signed)
Reviewed & agree Thanks DrG 

## 2018-01-13 NOTE — Addendum Note (Signed)
Addended by: Forde Dandy on: 01/13/2018 10:21 AM   Modules accepted: Orders

## 2018-01-21 ENCOUNTER — Ambulatory Visit (INDEPENDENT_AMBULATORY_CARE_PROVIDER_SITE_OTHER): Payer: Medicare Other | Admitting: Pharmacist

## 2018-01-21 DIAGNOSIS — Z7901 Long term (current) use of anticoagulants: Secondary | ICD-10-CM | POA: Diagnosis not present

## 2018-01-21 DIAGNOSIS — I2699 Other pulmonary embolism without acute cor pulmonale: Secondary | ICD-10-CM | POA: Diagnosis not present

## 2018-01-21 DIAGNOSIS — Z5181 Encounter for therapeutic drug level monitoring: Secondary | ICD-10-CM

## 2018-01-21 LAB — POCT INR: INR: 1.6 — AB (ref 2.0–3.0)

## 2018-01-21 NOTE — Patient Instructions (Signed)
Patient educated about medication as defined in this encounter and verbalized understanding by repeating back instructions provided.   

## 2018-01-21 NOTE — Progress Notes (Addendum)
Anticoagulation Management Kim Anthony is a 67 y.o. female who reports to the clinic for monitoring of warfarin treatment.    Indication: history of PE and mechanical mitral valve Duration: indefinite Supervising physician:Nischal Cedar  Anticoagulation Clinic Visit History: Patient does not report signs/symptoms of bleeding or thromboembolism. Patient reports no diet, medication, or lifestyle changes.  Anticoagulation Episode Summary    Current INR goal:   2.5-3.5  TTR:   33.5 % (2.4 y)  Next INR check:   01/27/2018  INR from last check:   1.6! (01/21/2018)  Weekly max warfarin dose:     Target end date:     INR check location:   Anticoagulation Clinic  Preferred lab:     Send INR reminders to:      Indications   Pulmonary embolism (HCC) [I26.99]       Comments:           Allergies  Allergen Reactions  . Acetaminophen-Codeine Itching  . Other Itching    Darvocet  . Tramadol Itching    vomitting   Medication Sig  albuterol (PROVENTIL HFA;VENTOLIN HFA) 108 (90 Base) MCG/ACT inhaler Inhale 1-2 puffs into the lungs every 6 (six) hours as needed for wheezing or shortness of breath.  albuterol (PROVENTIL) (2.5 MG/3ML) 0.083% nebulizer solution Take 3 mLs (2.5 mg total) by nebulization every 6 (six) hours as needed for wheezing or shortness of breath.  calcium citrate-vitamin D (CITRACAL+D) 315-200 MG-UNIT tablet Take 2 tablets by mouth 2 (two) times daily.  cetirizine (ZYRTEC) 10 MG tablet Take 1 tablet (10 mg total) by mouth daily.  diclofenac sodium (VOLTAREN) 1 % GEL Apply 2 g topically 3 (three) times daily as needed.  enoxaparin (LOVENOX) 120 MG/0.8ML injection Inject 0.8 mLs (120 mg total) into the skin daily.  fluticasone (FLONASE) 50 MCG/ACT nasal spray Place 2 sprays into both nostrils daily.  furosemide (LASIX) 40 MG tablet TAKE 1 TABLET BY MOUTH ONCE DAILY  gabapentin (NEURONTIN) 600 MG tablet Take 600 mg by mouth 3 (three) times daily.  lisinopril  (PRINIVIL,ZESTRIL) 10 MG tablet Take 1 tablet (10 mg total) by mouth daily.  metoprolol succinate (TOPROL XL) 25 MG 24 hr tablet Take 0.5 tablets (12.5 mg total) by mouth daily.  potassium chloride 20 MEQ/15ML (10%) SOLN Take 30 mLs (40 mEq total) by mouth daily.  simvastatin (ZOCOR) 20 MG tablet Take 1 tablet (20 mg total) by mouth daily.  tiZANidine (ZANAFLEX) 4 MG tablet Take 4 mg by mouth 3 (three) times daily.  umeclidinium-vilanterol (ANORO ELLIPTA) 62.5-25 MCG/INH AEPB Inhale 1 puff into the lungs daily.  warfarin (COUMADIN) 5 MG tablet Take 1 tablet (5 mg total) by mouth daily. Except take 1.5 tablets (7.5 mg) Wednesdays and Fridays   Past Medical History:  Diagnosis Date  . Breast cancer ([place])    Left Breast Cancer  . CHF (congestive heart failure) ([place])   . COPD (chronic obstructive pulmonary disease) ([place])   . Coronary artery disease   . History of mitral valve replacement with mechanical valve   . HLD (hyperlipidemia)   . Hypertension   . Pulmonary embolism [facility])    Social History   Socioeconomic History  . Marital status: Single    Spouse name: Not on file  . Number of children: Not on file  . Years of education: Not on file  . Highest education level: Not on file  Occupational History  . Not on file  Social Needs  . Financial resource strain: Not on file  .  Food insecurity:    Worry: Not on file    Inability: Not on file  . Transportation needs:    Medical: Not on file    Non-medical: Not on file  Tobacco Use  . Smoking status: Current Every Day Smoker    Packs/day: 0.10    Types: Cigarettes  . Smokeless tobacco: Never Used  . Tobacco comment: 1 pack per week  Substance and Sexual Activity  . Alcohol use: No    Comment: beer  . Drug use: No  . Sexual activity: Not on file  Lifestyle  . Physical activity:    Days per week: Not on file    Minutes per session: Not on file  . Stress: Not on file  Relationships  . Social connections:    Talks on phone:  Not on file    Gets together: Not on file    Attends religious service: Not on file    Active member of club or organization: Not on file    Attends meetings of clubs or organizations: Not on file    Relationship status: Not on file  Other Topics Concern  . Not on file  Social History Narrative   ** Merged History Encounter **       Family History  Problem Relation Age of Onset  . Heart attack Unknown   . Breast cancer Maternal Aunt 50   ASSESSMENT Lab Results  Component Value Date   INR 1.6 (A) 01/21/2018   INR 1.2 (A) 01/12/2018   INR 4.7 (A) 12/07/2017   Anticoagulation Dosing: Su-5mg M-5mg T-5mg W-7.5mg Th-5mg F-7.5mg Sa-5mg    INR today: Subtherapeutic  PLAN Continue enoxaparin 120 mg daily, increase warfarin to 7.5 mg Monday Wednesday Friday, 5 mg all other days. Cautious titration due to possible adherence challenges and recent supratherapeutic INR on lower dose. Follow up in 1 week.  Patient Instructions  Patient educated about medication as defined in this encounter and verbalized understanding by repeating back instructions provided.   Patient advised to contact clinic or seek medical attention if signs/symptoms of bleeding or thromboembolism occur.  Patient verbalized understanding by repeating back information and was advised to contact me if further medication-related questions arise. Patient was also provided an information handout.  Follow-up Return in about 1 week (around 01/28/2018).  [name]  15 minutes spent face-to-face with the patient during the encounter. 50% of time spent on education. 50% of time was spent on assessment and plan.

## 2018-01-24 NOTE — Progress Notes (Signed)
INTERNAL MEDICINE TEACHING ATTENDING ADDENDUM - Saahil Herbster M.D  °Duration- indefinite, Indication- PE, INR- sub therapeutic. Agree with pharmacy recommendations as outlined in their note.  ° ° ° °

## 2018-01-27 ENCOUNTER — Ambulatory Visit: Payer: Medicare Other | Admitting: Pharmacist

## 2018-01-28 ENCOUNTER — Other Ambulatory Visit: Payer: Self-pay | Admitting: Internal Medicine

## 2018-01-31 ENCOUNTER — Telehealth: Payer: Self-pay | Admitting: Pharmacist

## 2018-01-31 ENCOUNTER — Ambulatory Visit: Payer: Medicare Other

## 2018-01-31 NOTE — Telephone Encounter (Signed)
Pt would like for yo to give her a  Call back as soon as possible.

## 2018-02-09 ENCOUNTER — Other Ambulatory Visit: Payer: Self-pay | Admitting: Student-PharmD

## 2018-02-09 ENCOUNTER — Ambulatory Visit (INDEPENDENT_AMBULATORY_CARE_PROVIDER_SITE_OTHER): Payer: Medicare Other | Admitting: Pharmacist

## 2018-02-09 DIAGNOSIS — Z86711 Personal history of pulmonary embolism: Secondary | ICD-10-CM

## 2018-02-09 DIAGNOSIS — Z952 Presence of prosthetic heart valve: Secondary | ICD-10-CM | POA: Diagnosis not present

## 2018-02-09 DIAGNOSIS — Z7901 Long term (current) use of anticoagulants: Secondary | ICD-10-CM | POA: Diagnosis not present

## 2018-02-09 DIAGNOSIS — Z5181 Encounter for therapeutic drug level monitoring: Secondary | ICD-10-CM | POA: Diagnosis not present

## 2018-02-09 DIAGNOSIS — I2699 Other pulmonary embolism without acute cor pulmonale: Secondary | ICD-10-CM

## 2018-02-09 LAB — POCT INR: INR: 2 (ref 2.0–3.0)

## 2018-02-09 MED ORDER — WARFARIN SODIUM 5 MG PO TABS: 5.0000 mg | ORAL_TABLET | Freq: Every day | ORAL | 0 refills | Status: DC

## 2018-02-09 MED ORDER — ENOXAPARIN SODIUM 120 MG/0.8ML ~~LOC~~ SOLN: 120.0000 mg | SUBCUTANEOUS | 0 refills | Status: DC

## 2018-02-09 NOTE — Progress Notes (Addendum)
Anticoagulation Management Kim Anthony is a 67 y.o. female who reports to the clinic for monitoring of warfarin treatment.    Indication: history of PE and mechanical mitral valve Duration: indefinite Supervising physician: Aldine Contes  Anticoagulation Clinic Visit History: Patient does not report signs/symptoms of bleeding or thromboembolism. Patient reports no diet, medication, or lifestyle changes.  Anticoagulation Episode Summary    Current INR goal:   2.5-3.5  TTR:   32.8 % (2.5 y)  Next INR check:   02/16/2018  INR from last check:   2.0! (02/09/2018)  Weekly max warfarin dose:     Target end date:     INR check location:   Anticoagulation Clinic  Preferred lab:     Send INR reminders to:      Indications   Pulmonary embolism (HCC) [I26.99]       Comments:          Allergies  Allergen Reactions  . Acetaminophen-Codeine Itching  . Other Itching    Darvocet  . Tramadol Itching    vomitting   Medication Sig  albuterol (PROVENTIL HFA;VENTOLIN HFA) 108 (90 Base) MCG/ACT inhaler Inhale 1-2 puffs into the lungs every 6 (six) hours as needed for wheezing or shortness of breath.  albuterol (PROVENTIL) (2.5 MG/3ML) 0.083% nebulizer solution Take 3 mLs (2.5 mg total) by nebulization every 6 (six) hours as needed for wheezing or shortness of breath.  calcium citrate-vitamin D (CITRACAL+D) 315-200 MG-UNIT tablet Take 2 tablets by mouth 2 (two) times daily.  cetirizine (ZYRTEC) 10 MG tablet Take 1 tablet (10 mg total) by mouth daily.  diclofenac sodium (VOLTAREN) 1 % GEL Apply 2 g topically 3 (three) times daily as needed.  enoxaparin (LOVENOX) 120 MG/0.8ML injection Inject 0.8 mLs (120 mg total) into the skin daily.  fluticasone (FLONASE) 50 MCG/ACT nasal spray Place 2 sprays into both nostrils daily.  furosemide (LASIX) 40 MG tablet TAKE 1 TABLET BY MOUTH ONCE DAILY  gabapentin (NEURONTIN) 600 MG tablet Take 600 mg by mouth 3 (three) times daily.  lisinopril  (PRINIVIL,ZESTRIL) 10 MG tablet Take 1 tablet (10 mg total) by mouth daily.  metoprolol succinate (TOPROL-XL) 25 MG 24 hr tablet TAKE 1/2 (ONE-HALF) TABLET BY MOUTH ONCE DAILY  potassium chloride 20 MEQ/15ML (10%) SOLN Take 30 mLs (40 mEq total) by mouth daily.  simvastatin (ZOCOR) 20 MG tablet Take 1 tablet (20 mg total) by mouth daily.  tiZANidine (ZANAFLEX) 4 MG tablet Take 4 mg by mouth 3 (three) times daily.  umeclidinium-vilanterol (ANORO ELLIPTA) 62.5-25 MCG/INH AEPB Inhale 1 puff into the lungs daily.  warfarin (COUMADIN) 5 MG tablet Take 1 tablet (5 mg total) by mouth daily. Except take 1.5 tablets (7.5 mg) Wednesdays and Fridays   Past Medical History:  Diagnosis Date  . Breast cancer ([place])    Left Breast Cancer  . CHF (congestive heart failure) ([place])   . COPD (chronic obstructive pulmonary disease) ([place])   . Coronary artery disease   . History of mitral valve replacement with mechanical valve   . HLD (hyperlipidemia)   . Hypertension   . Pulmonary embolism [facility])    Social History   Socioeconomic History  . Marital status: Single    Spouse name: Not on file  . Number of children: Not on file  . Years of education: Not on file  . Highest education level: Not on file  Occupational History  . Not on file  Social Needs  . Financial resource strain: Not on file  . Food  insecurity:    Worry: Not on file    Inability: Not on file  . Transportation needs:    Medical: Not on file    Non-medical: Not on file  Tobacco Use  . Smoking status: Current Every Day Smoker    Packs/day: 0.10    Types: Cigarettes  . Smokeless tobacco: Never Used  . Tobacco comment: 1 pack per week  Substance and Sexual Activity  . Alcohol use: No    Comment: beer  . Drug use: No  . Sexual activity: Not on file  Lifestyle  . Physical activity:    Days per week: Not on file    Minutes per session: Not on file  . Stress: Not on file  Relationships  . Social connections:    Talks on phone:  Not on file    Gets together: Not on file    Attends religious service: Not on file    Active member of club or organization: Not on file    Attends meetings of clubs or organizations: Not on file    Relationship status: Not on file  Other Topics Concern  . Not on file  Social History Narrative   ** Merged History Encounter **       Family History  Problem Relation Age of Onset  . Heart attack Unknown   . Breast cancer Maternal Aunt 50    ASSESSMENT Recent Results: Lab Results  Component Value Date   INR 2.0 02/09/2018   INR 1.6 (A) 01/21/2018   INR 1.2 (A) 01/12/2018    Anticoagulation Dosing:   INR today: Subtherapeutic (2.0)  PLAN Weekly dose was decreased by 5% to 40 mg per week. Patient reported only taking 5 mg daily. She did not take the 7.5 mg on Mondays, Wednesdays, and Fridays as prescribed. Patient is subtherapeutic so will continue to bridge patient on lovenox. Patient INR increased from 1.6 to 2.0 while only taking 5 mg daily, so we will decrease weekly dose slightly to prevent supratherapeutic values when she begins incorporating 7.5 mg.  Patient advised to contact clinic or seek medical attention if signs/symptoms of bleeding or thromboembolism occur.  Patient verbalized understanding by repeating back information and was advised to contact me if further medication-related questions arise. Patient was also provided an information handout with updated dosing schedule.  Follow-up In 1 week (02/16/18 at 4:30PM)  [name]

## 2018-02-15 ENCOUNTER — Other Ambulatory Visit: Payer: Self-pay | Admitting: Internal Medicine

## 2018-02-15 DIAGNOSIS — I2699 Other pulmonary embolism without acute cor pulmonale: Secondary | ICD-10-CM

## 2018-02-16 ENCOUNTER — Ambulatory Visit (INDEPENDENT_AMBULATORY_CARE_PROVIDER_SITE_OTHER): Payer: Medicare Other | Admitting: Pharmacist

## 2018-02-16 DIAGNOSIS — I2699 Other pulmonary embolism without acute cor pulmonale: Secondary | ICD-10-CM

## 2018-02-16 DIAGNOSIS — Z5181 Encounter for therapeutic drug level monitoring: Secondary | ICD-10-CM

## 2018-02-16 DIAGNOSIS — Z7901 Long term (current) use of anticoagulants: Secondary | ICD-10-CM

## 2018-02-16 LAB — POCT INR: INR: 1.6 — AB (ref 2.0–3.0)

## 2018-02-16 NOTE — Patient Instructions (Signed)
Patient educated about medication as defined in this encounter and verbalized understanding by repeating back instructions provided.   

## 2018-02-16 NOTE — Progress Notes (Addendum)
Anticoagulation Management Kim Anthony is a 67 y.o. female who reports to the clinic for monitoring of warfarin treatment.    Indication: history of PE and mechanical mitral valve  Duration: indefinite Supervising physician: Aldine Contes  Anticoagulation Clinic Visit History: Patient does not report signs/symptoms of bleeding or thromboembolism. Patient does report eating broccoli a few days ago and a significant amount of collards and spinach last night. Patient also reports only taking 2.5 mg last night (Tuesday, 8/27). Patient was supposed to take 7.5 mg on Mondays and Wednesdays and 5 mg every other day.  Anticoagulation Episode Summary    Current INR goal:   2.5-3.5  TTR:   32.6 % (2.5 y)  Next INR check:   02/23/2018  INR from last check:   1.6! (02/16/2018)  Weekly max warfarin dose:     Target end date:     INR check location:   Anticoagulation Clinic  Preferred lab:     Send INR reminders to:      Indications   Pulmonary embolism (HCC) [I26.99]       Comments:          Allergies  Allergen Reactions  . Acetaminophen-Codeine Itching  . Other Itching    Darvocet  . Tramadol Itching    vomitting   Prior to Admission medications   Medication Sig Start Date End Date Taking? Authorizing Provider  albuterol (PROVENTIL HFA;VENTOLIN HFA) 108 (90 Base) MCG/ACT inhaler Inhale 1-2 puffs into the lungs every 6 (six) hours as needed for wheezing or shortness of breath. 12/07/17   Zada Finders, MD  albuterol (PROVENTIL) (2.5 MG/3ML) 0.083% nebulizer solution Take 3 mLs (2.5 mg total) by nebulization every 6 (six) hours as needed for wheezing or shortness of breath. 11/26/17   Katherine Roan, MD  calcium citrate-vitamin D (CITRACAL+D) 315-200 MG-UNIT tablet Take 2 tablets by mouth 2 (two) times daily. 01/07/18 07/06/18  Katherine Roan, MD  cetirizine (ZYRTEC) 10 MG tablet Take 1 tablet (10 mg total) by mouth daily. 05/27/17   Jule Ser, DO  diclofenac sodium (VOLTAREN) 1  % GEL Apply 2 g topically 3 (three) times daily as needed. 02/25/17   Alphonzo Grieve, MD  enoxaparin (LOVENOX) 120 MG/0.8ML injection Inject 0.8 mLs (120 mg total) into the skin daily. 02/09/18   Isabelle Course, MD  fluticasone (FLONASE) 50 MCG/ACT nasal spray Place 2 sprays into both nostrils daily. 12/03/16 12/03/17  Jule Ser, DO  furosemide (LASIX) 40 MG tablet TAKE 1 TABLET BY MOUTH ONCE DAILY 12/27/17   Sid Falcon, MD  gabapentin (NEURONTIN) 600 MG tablet Take 600 mg by mouth 3 (three) times daily. 09/23/17   [provider]  lisinopril (PRINIVIL,ZESTRIL) 10 MG tablet Take 1 tablet (10 mg total) by mouth daily. 05/27/17   Jule Ser, DO  metoprolol succinate (TOPROL-XL) 25 MG 24 hr tablet TAKE 1/2 (ONE-HALF) TABLET BY MOUTH ONCE DAILY 01/29/18   Isabelle Course, MD  potassium chloride 20 MEQ/15ML (10%) SOLN Take 30 mLs (40 mEq total) by mouth daily. 12/08/17   Zada Finders, MD  simvastatin (ZOCOR) 20 MG tablet Take 1 tablet (20 mg total) by mouth daily. 05/27/17   Jule Ser, DO  tiZANidine (ZANAFLEX) 4 MG tablet Take 4 mg by mouth 3 (three) times daily. 09/23/17   [provider]  umeclidinium-vilanterol (ANORO ELLIPTA) 62.5-25 MCG/INH AEPB Inhale 1 puff into the lungs daily. 11/26/17   Katherine Roan, MD  warfarin (COUMADIN) 5 MG tablet Take 1 tablet (5  mg total) by mouth daily. Except take 1.5 tablets (7.5 mg) Wednesdays and Fridays 02/09/18   Isabelle Course, MD   Past Medical History:  Diagnosis Date  . Breast cancer ([place])    Left Breast Cancer  . CHF (congestive heart failure) ([place])   . COPD (chronic obstructive pulmonary disease) ([place])   . Coronary artery disease   . History of mitral valve replacement with mechanical valve   . HLD (hyperlipidemia)   . Hypertension   . Pulmonary embolism [facility])    Social History   Socioeconomic History  . Marital status: Single    Spouse name: Not on file  . Number of children: Not on file  . Years of education: Not  on file  . Highest education level: Not on file  Occupational History  . Not on file  Social Needs  . Financial resource strain: Not on file  . Food insecurity:    Worry: Not on file    Inability: Not on file  . Transportation needs:    Medical: Not on file    Non-medical: Not on file  Tobacco Use  . Smoking status: Current Every Day Smoker    Packs/day: 0.10    Types: Cigarettes  . Smokeless tobacco: Never Used  . Tobacco comment: 1 pack per week  Substance and Sexual Activity  . Alcohol use: No    Comment: beer  . Drug use: No  . Sexual activity: Not on file  Lifestyle  . Physical activity:    Days per week: Not on file    Minutes per session: Not on file  . Stress: Not on file  Relationships  . Social connections:    Talks on phone: Not on file    Gets together: Not on file    Attends religious service: Not on file    Active member of club or organization: Not on file    Attends meetings of clubs or organizations: Not on file    Relationship status: Not on file  Other Topics Concern  . Not on file  Social History Narrative   ** Merged History Encounter **       Family History  Problem Relation Age of Onset  . Heart attack Unknown   . Breast cancer Maternal Aunt 50   ASSESSMENT Recent Results: Lab Results  Component Value Date   INR 1.6 (A) 02/16/2018   INR 2.0 02/09/2018   INR 1.6 (A) 01/21/2018   Anticoagulation Dosing:   INR today: Subtherapeutic (1.6)  PLAN Weekly dose was unchanged.  Patient educated on importance of consistently eating greens and how it can effect her INR. Patient will continue Lovenox shots until INR is therapeutic. Plan is to continue current dose of warfarin (7.5mg  on Mondays and Wednesdays and 5mg  all other days). We are concerned that if we increase her dose she will become supratherapeutic once she takes her dose as prescribed and decreases her green intake.   Patient advised to contact clinic or seek medical attention if  signs/symptoms of bleeding or thromboembolism occur.  Patient verbalized understanding by repeating back information and was advised to contact me if further medication-related questions arise. Patient was also provided an information handout.  Follow-up Follow up in 1 week (02/23/18 at 11:00AM) Patrina Levering, PharmD Candidate

## 2018-02-16 NOTE — Progress Notes (Signed)
Patient was seen in clinic with Katie Comanici and Ife Fasina, PharmD candidates. I agree with the assessment and plan of care documented.  

## 2018-02-23 ENCOUNTER — Ambulatory Visit (INDEPENDENT_AMBULATORY_CARE_PROVIDER_SITE_OTHER): Payer: Medicare Other | Admitting: Pharmacist

## 2018-02-23 ENCOUNTER — Other Ambulatory Visit: Payer: Self-pay | Admitting: Internal Medicine

## 2018-02-23 DIAGNOSIS — Z7901 Long term (current) use of anticoagulants: Secondary | ICD-10-CM | POA: Diagnosis not present

## 2018-02-23 DIAGNOSIS — Z5181 Encounter for therapeutic drug level monitoring: Secondary | ICD-10-CM | POA: Diagnosis not present

## 2018-02-23 DIAGNOSIS — I2699 Other pulmonary embolism without acute cor pulmonale: Secondary | ICD-10-CM | POA: Diagnosis not present

## 2018-02-23 LAB — POCT INR: INR: 3.8 — AB (ref 2.0–3.0)

## 2018-02-23 NOTE — Patient Instructions (Signed)
Patient educated about medication as defined in this encounter and verbalized understanding by repeating back instructions provided.   

## 2018-02-23 NOTE — Progress Notes (Addendum)
Anticoagulation Management Kim Anthony is a 67 y.o. female who reports to the clinic for monitoring of warfarin treatment.    Indication: history of PE and mechanical mitral valve  Duration: indefinite Supervising physician: Lalla Brothers  Anticoagulation Clinic Visit History: Patient does not report signs/symptoms of bleeding or thromboembolism  Anticoagulation Episode Summary    Current INR goal:   2.5-3.5  TTR:   32.7 % (2.5 y)  Next INR check:   03/09/2018  INR from last check:   3.8! (02/23/2018)  Weekly max warfarin dose:     Target end date:     INR check location:   Anticoagulation Clinic  Preferred lab:     Send INR reminders to:      Indications   Pulmonary embolism (HCC) [I26.99]       Comments:          Allergies  Allergen Reactions  . Acetaminophen-Codeine Itching  . Other Itching    Darvocet  . Tramadol Itching    vomitting   Medication Sig  albuterol (PROVENTIL HFA;VENTOLIN HFA) 108 (90 Base) MCG/ACT inhaler Inhale 1-2 puffs into the lungs every 6 (six) hours as needed for wheezing or shortness of breath.  albuterol (PROVENTIL) (2.5 MG/3ML) 0.083% nebulizer solution Take 3 mLs (2.5 mg total) by nebulization every 6 (six) hours as needed for wheezing or shortness of breath.  calcium citrate-vitamin D (CITRACAL+D) 315-200 MG-UNIT tablet Take 2 tablets by mouth 2 (two) times daily.  cetirizine (ZYRTEC) 10 MG tablet Take 1 tablet (10 mg total) by mouth daily.  diclofenac sodium (VOLTAREN) 1 % GEL Apply 2 g topically 3 (three) times daily as needed.  fluticasone (FLONASE) 50 MCG/ACT nasal spray Place 2 sprays into both nostrils daily.  furosemide (LASIX) 40 MG tablet TAKE 1 TABLET BY MOUTH ONCE DAILY  gabapentin (NEURONTIN) 600 MG tablet Take 600 mg by mouth 3 (three) times daily.  lisinopril (PRINIVIL,ZESTRIL) 10 MG tablet Take 1 tablet (10 mg total) by mouth daily.  metoprolol succinate (TOPROL-XL) 25 MG 24 hr tablet TAKE 1/2 (ONE-HALF) TABLET BY MOUTH ONCE  DAILY  potassium chloride 20 MEQ/15ML (10%) SOLN Take 30 mLs (40 mEq total) by mouth daily.  simvastatin (ZOCOR) 20 MG tablet Take 1 tablet (20 mg total) by mouth daily.  tiZANidine (ZANAFLEX) 4 MG tablet Take 4 mg by mouth 3 (three) times daily.  umeclidinium-vilanterol (ANORO ELLIPTA) 62.5-25 MCG/INH AEPB Inhale 1 puff into the lungs daily.  warfarin (COUMADIN) 5 MG tablet Take 1 tablet (5 mg total) by mouth daily. Except take 1.5 tablets (7.5 mg) Wednesdays and Fridays   Past Medical History:  Diagnosis Date  . Breast cancer (Kim Anthony)    Left Breast Cancer  . CHF (congestive heart failure) (Kim Anthony)   . COPD (chronic obstructive pulmonary disease) ([place])   . Coronary artery disease   . History of mitral valve replacement with mechanical valve   . HLD (hyperlipidemia)   . Hypertension   . Pulmonary embolism [facility])    Social History   Socioeconomic History  . Marital status: Single    Spouse name: Not on file  . Number of children: Not on file  . Years of education: Not on file  . Highest education level: Not on file  Occupational History  . Not on file  Social Needs  . Financial resource strain: Not on file  . Food insecurity:    Worry: Not on file    Inability: Not on file  . Transportation needs:    Medical:  Not on file    Non-medical: Not on file  Tobacco Use  . Smoking status: Current Every Day Smoker    Packs/day: 0.10    Types: Cigarettes  . Smokeless tobacco: Never Used  . Tobacco comment: 1 pack per week  Substance and Sexual Activity  . Alcohol use: No    Comment: beer  . Drug use: No  . Sexual activity: Not on file  Lifestyle  . Physical activity:    Days per week: Not on file    Minutes per session: Not on file  . Stress: Not on file  Relationships  . Social connections:    Talks on phone: Not on file    Gets together: Not on file    Attends religious service: Not on file    Active member of club or organization: Not on file    Attends meetings of clubs  or organizations: Not on file    Relationship status: Not on file  Other Topics Concern  . Not on file  Social History Narrative   ** Merged History Encounter **       Family History  Problem Relation Age of Onset  . Heart attack Unknown   . Breast cancer Maternal Aunt 50   ASSESSMENT Recent Results: Lab Results  Component Value Date   INR 3.8 (A) 02/23/2018   INR 1.6 (A) 02/16/2018   INR 2.0 02/09/2018    INR today: Supratherapeutic  PLAN Weekly dose was decreased by 6% to 37.5 mg per week. Stop enoxaparin (Lovenox) injections.  Patient Instructions  Patient educated about medication as defined in this encounter and verbalized understanding by repeating back instructions provided.    Patient advised to contact clinic or seek medical attention if signs/symptoms of bleeding or thromboembolism occur.  Patient verbalized understanding by repeating back information and was advised to contact me if further medication-related questions arise. Patient was also provided an information handout.  Follow-up Return in about 2 weeks (around 03/09/2018).  [name]

## 2018-02-24 NOTE — Progress Notes (Signed)
INTERNAL MEDICINE TEACHING ATTENDING ADDENDUM ° °I agree with pharmacy recommendations as outlined in their note.  ° °-Abbeygail Igoe MD ° °

## 2018-03-10 ENCOUNTER — Ambulatory Visit (INDEPENDENT_AMBULATORY_CARE_PROVIDER_SITE_OTHER): Payer: Medicare Other | Admitting: Pharmacist

## 2018-03-10 DIAGNOSIS — I2699 Other pulmonary embolism without acute cor pulmonale: Secondary | ICD-10-CM | POA: Diagnosis not present

## 2018-03-10 LAB — POCT INR: INR: 4.6 — AB (ref 2.0–3.0)

## 2018-03-10 NOTE — Progress Notes (Addendum)
Anticoagulation Management Kim Anthony is a 67 y.o. female who reports to the clinic for monitoring of warfarin treatment.    Indication: history of PE and mechanical mitral valve Duration: indefinite Supervising physician: Aldine Contes  Anticoagulation Clinic Visit History: Patient adherent to warfarin dose regimen for past 2 weeks. Patient does not report signs/symptoms of bleeding or thromboembolism. Other recent changes: Patient reports not eating greens the past 2 weeks. Anticoagulation Episode Summary    Current INR goal:   2.5-3.5  TTR:   32.1 % (2.6 y)  Next INR check:   03/09/2018  INR from last check:   3.8! (02/23/2018)  Most recent INR:    4.6! (03/10/2018)  Weekly max warfarin dose:     Target end date:     INR check location:   Anticoagulation Clinic  Preferred lab:     Send INR reminders to:      Indications   Pulmonary embolism (HCC) [I26.99]       Comments:          Allergies  Allergen Reactions  . Acetaminophen-Codeine Itching  . Other Itching    Darvocet  . Tramadol Itching    vomitting   Medication Sig  albuterol (PROVENTIL HFA;VENTOLIN HFA) 108 (90 Base) MCG/ACT inhaler Inhale 1-2 puffs into the lungs every 6 (six) hours as needed for wheezing or shortness of breath.  albuterol (PROVENTIL) (2.5 MG/3ML) 0.083% nebulizer solution Take 3 mLs (2.5 mg total) by nebulization every 6 (six) hours as needed for wheezing or shortness of breath.  calcium citrate-vitamin D (CITRACAL+D) 315-200 MG-UNIT tablet Take 2 tablets by mouth 2 (two) times daily.  cetirizine (ZYRTEC) 10 MG tablet Take 1 tablet (10 mg total) by mouth daily.  diclofenac sodium (VOLTAREN) 1 % GEL Apply 2 g topically 3 (three) times daily as needed.  fluticasone (FLONASE) 50 MCG/ACT nasal spray Place 2 sprays into both nostrils daily.  furosemide (LASIX) 40 MG tablet TAKE 1 TABLET BY MOUTH ONCE DAILY  gabapentin (NEURONTIN) 600 MG tablet Take 600 mg by mouth 3 (three) times daily.   lisinopril (PRINIVIL,ZESTRIL) 10 MG tablet Take 1 tablet (10 mg total) by mouth daily.  metoprolol succinate (TOPROL-XL) 25 MG 24 hr tablet TAKE 1/2 (ONE-HALF) TABLET BY MOUTH ONCE DAILY  potassium chloride 20 MEQ/15ML (10%) SOLN Take 30 mLs (40 mEq total) by mouth daily.  simvastatin (ZOCOR) 20 MG tablet Take 1 tablet (20 mg total) by mouth daily.  tiZANidine (ZANAFLEX) 4 MG tablet Take 4 mg by mouth 3 (three) times daily.  umeclidinium-vilanterol (ANORO ELLIPTA) 62.5-25 MCG/INH AEPB Inhale 1 puff into the lungs daily.  warfarin (COUMADIN) 5 MG tablet Take 1 tablet (5 mg total) by mouth daily. Except take 1.5 tablets (7.5 mg) Wednesdays and Fridays   Past Medical History:  Diagnosis Date  . Breast cancer ([place])    Left Breast Cancer  . CHF (congestive heart failure) ([place])   . COPD (chronic obstructive pulmonary disease) ([place])   . Coronary artery disease   . History of mitral valve replacement with mechanical valve   . HLD (hyperlipidemia)   . Hypertension   . Pulmonary embolism [facility])    Social History   Socioeconomic History  . Marital status: Single    Spouse name: Not on file  . Number of children: Not on file  . Years of education: Not on file  . Highest education level: Not on file  Occupational History  . Not on file  Social Needs  . Financial resource strain:  Not on file  . Food insecurity:    Worry: Not on file    Inability: Not on file  . Transportation needs:    Medical: Not on file    Non-medical: Not on file  Tobacco Use  . Smoking status: Current Every Day Smoker    Packs/day: 0.10    Types: Cigarettes  . Smokeless tobacco: Never Used  . Tobacco comment: 1 pack per week  Substance and Sexual Activity  . Alcohol use: No    Comment: beer  . Drug use: No  . Sexual activity: Not on file  Lifestyle  . Physical activity:    Days per week: Not on file    Minutes per session: Not on file  . Stress: Not on file  Relationships  . Social connections:     Talks on phone: Not on file    Gets together: Not on file    Attends religious service: Not on file    Active member of club or organization: Not on file    Attends meetings of clubs or organizations: Not on file    Relationship status: Not on file  Other Topics Concern  . Not on file  Social History Narrative   ** Merged History Encounter **       Family History  Problem Relation Age of Onset  . Heart attack Unknown   . Breast cancer Maternal Aunt 50   ASSESSMENT Recent Results: The most recent result is correlated with 37.5 mg per week: Lab Results  Component Value Date   INR 4.6 (A) 03/10/2018   INR 3.8 (A) 02/23/2018   INR 1.6 (A) 02/16/2018    INR today: Supratherapeutic  PLAN Weekly dose was decreased by 7% to 35 mg per week. Patient will hold warfarin today. She will take 5 mg daily starting tomorrow.  We discussed with patient that she is allowed to eat greens again, and she can eat the same amount on the same days of each week.  Patient advised to contact clinic or seek medical attention if signs/symptoms of bleeding or thromboembolism occur.  Patient verbalized understanding by repeating back information and was advised to contact me if further medication-related questions arise. Patient was also provided an information handout.  Follow-up Follow up in 2 weeks with Dr. Maudie Mercury.  Julieta Bellini, PharmD Candidate

## 2018-03-10 NOTE — Patient Instructions (Signed)
Patient educated about medication as defined in this encounter and verbalized understanding by repeating back instructions provided.   

## 2018-03-14 ENCOUNTER — Other Ambulatory Visit: Payer: Self-pay | Admitting: Internal Medicine

## 2018-03-14 NOTE — Progress Notes (Signed)
INTERNAL MEDICINE TEACHING ATTENDING ADDENDUM - Aldine Contes M.D  Duration- indefinite, Indication- mechanical MVR, PE, INR- supratherapeutic. Agree with pharmacy recommendations as outlined in their note.

## 2018-03-24 ENCOUNTER — Other Ambulatory Visit: Payer: Self-pay

## 2018-03-24 ENCOUNTER — Ambulatory Visit (INDEPENDENT_AMBULATORY_CARE_PROVIDER_SITE_OTHER): Payer: Medicare Other | Admitting: Internal Medicine

## 2018-03-24 ENCOUNTER — Ambulatory Visit (INDEPENDENT_AMBULATORY_CARE_PROVIDER_SITE_OTHER): Payer: Medicare Other | Admitting: Pharmacist

## 2018-03-24 ENCOUNTER — Encounter: Payer: Self-pay | Admitting: Internal Medicine

## 2018-03-24 VITALS — BP 130/74 | HR 84 | Temp 99.2°F | Ht 65.0 in | Wt 165.2 lb

## 2018-03-24 DIAGNOSIS — Z720 Tobacco use: Secondary | ICD-10-CM | POA: Diagnosis not present

## 2018-03-24 DIAGNOSIS — Z952 Presence of prosthetic heart valve: Secondary | ICD-10-CM | POA: Diagnosis not present

## 2018-03-24 DIAGNOSIS — Z923 Personal history of irradiation: Secondary | ICD-10-CM

## 2018-03-24 DIAGNOSIS — Z9221 Personal history of antineoplastic chemotherapy: Secondary | ICD-10-CM | POA: Diagnosis not present

## 2018-03-24 DIAGNOSIS — Z86711 Personal history of pulmonary embolism: Secondary | ICD-10-CM | POA: Diagnosis not present

## 2018-03-24 DIAGNOSIS — I509 Heart failure, unspecified: Secondary | ICD-10-CM

## 2018-03-24 DIAGNOSIS — M48061 Spinal stenosis, lumbar region without neurogenic claudication: Secondary | ICD-10-CM | POA: Diagnosis not present

## 2018-03-24 DIAGNOSIS — J449 Chronic obstructive pulmonary disease, unspecified: Secondary | ICD-10-CM | POA: Diagnosis not present

## 2018-03-24 DIAGNOSIS — Z853 Personal history of malignant neoplasm of breast: Secondary | ICD-10-CM | POA: Diagnosis not present

## 2018-03-24 DIAGNOSIS — I110 Hypertensive heart disease with heart failure: Secondary | ICD-10-CM | POA: Diagnosis not present

## 2018-03-24 DIAGNOSIS — Z5181 Encounter for therapeutic drug level monitoring: Secondary | ICD-10-CM

## 2018-03-24 DIAGNOSIS — Z0000 Encounter for general adult medical examination without abnormal findings: Secondary | ICD-10-CM

## 2018-03-24 DIAGNOSIS — Z7901 Long term (current) use of anticoagulants: Secondary | ICD-10-CM

## 2018-03-24 DIAGNOSIS — G8929 Other chronic pain: Secondary | ICD-10-CM

## 2018-03-24 DIAGNOSIS — Z23 Encounter for immunization: Secondary | ICD-10-CM | POA: Diagnosis not present

## 2018-03-24 DIAGNOSIS — E785 Hyperlipidemia, unspecified: Secondary | ICD-10-CM | POA: Diagnosis not present

## 2018-03-24 DIAGNOSIS — M545 Low back pain: Secondary | ICD-10-CM | POA: Diagnosis not present

## 2018-03-24 DIAGNOSIS — F17200 Nicotine dependence, unspecified, uncomplicated: Secondary | ICD-10-CM

## 2018-03-24 DIAGNOSIS — I2699 Other pulmonary embolism without acute cor pulmonale: Secondary | ICD-10-CM

## 2018-03-24 LAB — POCT INR: INR: 4 — AB (ref 2.0–3.0)

## 2018-03-24 MED ORDER — WARFARIN SODIUM 5 MG PO TABS: 5.0000 mg | ORAL_TABLET | Freq: Every day | ORAL | 3 refills | Status: DC

## 2018-03-24 MED ORDER — BACLOFEN 5 MG PO TABS: 5.0000 mg | ORAL_TABLET | Freq: Three times a day (TID) | ORAL | 0 refills | Status: DC | PRN

## 2018-03-24 MED ORDER — NICOTINE 14 MG/24HR TD PT24: 14.0000 mg | MEDICATED_PATCH | TRANSDERMAL | 0 refills | Status: AC

## 2018-03-24 NOTE — Progress Notes (Signed)
Anticoagulation Management Kim Anthony is a 67 y.o. female who reports to the clinic for monitoring of warfarin treatment.    Indication: history of PE and mechanical mitral valve Duration: indefinite Supervising physician: Aldine Contes  Anticoagulation Clinic Visit History: Patient does not report signs/symptoms of bleeding or thromboembolism.  Other recent changes: Patient has been trying to cut back on cigarette smoking. Patient reports no changes in diet.   Anticoagulation Episode Summary    Current INR goal:   2.5-3.5  TTR:   31.6 % (2.6 y)  Next INR check:   03/31/2018  INR from last check:   4.0! (03/24/2018)  Weekly max warfarin dose:     Target end date:     INR check location:   Anticoagulation Clinic  Preferred lab:     Send INR reminders to:      Indications   Pulmonary embolism (HCC) [I26.99]       Comments:           Allergies  Allergen Reactions  . Acetaminophen-Codeine Itching  . Other Itching    Darvocet  . Tramadol Itching    vomitting   Prior to Admission medications   Medication Sig Start Date End Date Taking? Authorizing Provider  albuterol (PROVENTIL HFA;VENTOLIN HFA) 108 (90 Base) MCG/ACT inhaler Inhale 1-2 puffs into the lungs every 6 (six) hours as needed for wheezing or shortness of breath. 12/07/17   Lenore Cordia, MD  albuterol (PROVENTIL) (2.5 MG/3ML) 0.083% nebulizer solution Take 3 mLs (2.5 mg total) by nebulization every 6 (six) hours as needed for wheezing or shortness of breath. 11/26/17   Katherine Roan, MD  Baclofen 5 MG TABS Take 5 mg by mouth every 8 (eight) hours as needed. 03/24/18   Isabelle Course, MD  calcium citrate-vitamin D (CITRACAL+D) 315-200 MG-UNIT tablet Take 2 tablets by mouth 2 (two) times daily. 01/07/18 07/06/18  Katherine Roan, MD  cetirizine (ZYRTEC) 10 MG tablet Take 1 tablet (10 mg total) by mouth daily. 05/27/17   Jule Ser, DO  diclofenac sodium (VOLTAREN) 1 % GEL Apply 2 g topically 3 (three) times  daily as needed. 02/25/17   Alphonzo Grieve, MD  fluticasone (FLONASE) 50 MCG/ACT nasal spray Place 2 sprays into both nostrils daily. 12/03/16 12/03/17  Jule Ser, DO  furosemide (LASIX) 40 MG tablet TAKE 1 TABLET BY MOUTH ONCE DAILY 12/27/17   Sid Falcon, MD  gabapentin (NEURONTIN) 600 MG tablet Take 600 mg by mouth 3 (three) times daily. 09/23/17   [provider]  lisinopril (PRINIVIL,ZESTRIL) 10 MG tablet TAKE 1 TABLET BY MOUTH ONCE DAILY 03/14/18   Oda Kilts, MD  metoprolol succinate (TOPROL-XL) 25 MG 24 hr tablet TAKE 1/2 (ONE-HALF) TABLET BY MOUTH ONCE DAILY 01/29/18   Isabelle Course, MD  nicotine (NICODERM CQ - DOSED IN MG/24 HOURS) 14 mg/24hr patch Place 1 patch (14 mg total) onto the skin daily. 03/24/18 05/05/18  Isabelle Course, MD  potassium chloride 20 MEQ/15ML (10%) SOLN Take 30 mLs (40 mEq total) by mouth daily. 12/08/17   Lenore Cordia, MD  simvastatin (ZOCOR) 20 MG tablet TAKE 1 TABLET BY MOUTH ONCE DAILY 03/14/18   Oda Kilts, MD  tiZANidine (ZANAFLEX) 4 MG tablet Take 4 mg by mouth 3 (three) times daily. 09/23/17   [provider]  umeclidinium-vilanterol (ANORO ELLIPTA) 62.5-25 MCG/INH AEPB Inhale 1 puff into the lungs daily. 11/26/17   Katherine Roan, MD  warfarin (COUMADIN) 5 MG tablet Take  1 tablet (5 mg total) by mouth daily. Except take 1.5 tablets (7.5 mg) Wednesdays and Fridays 02/09/18   Isabelle Course, MD   Past Medical History:  Diagnosis Date  . Breast cancer ([place])    Left Breast Cancer  . CHF (congestive heart failure) ([place])   . COPD (chronic obstructive pulmonary disease) ([place])   . Coronary artery disease   . History of mitral valve replacement with mechanical valve   . HLD (hyperlipidemia)   . Hypertension   . Pulmonary embolism [facility])    Social History   Socioeconomic History  . Marital status: Single    Spouse name: Not on file  . Number of children: Not on file  . Years of education: Not on file  . Highest  education level: Not on file  Occupational History  . Not on file  Social Needs  . Financial resource strain: Not on file  . Food insecurity:    Worry: Not on file    Inability: Not on file  . Transportation needs:    Medical: Not on file    Non-medical: Not on file  Tobacco Use  . Smoking status: Current Every Day Smoker    Packs/day: 0.50    Types: Cigarettes  . Smokeless tobacco: Never Used  . Tobacco comment: Sometimes less.  Substance and Sexual Activity  . Alcohol use: Yes    Comment: beer  . Drug use: No  . Sexual activity: Not on file  Lifestyle  . Physical activity:    Days per week: Not on file    Minutes per session: Not on file  . Stress: Not on file  Relationships  . Social connections:    Talks on phone: Not on file    Gets together: Not on file    Attends religious service: Not on file    Active member of club or organization: Not on file    Attends meetings of clubs or organizations: Not on file    Relationship status: Not on file  Other Topics Concern  . Not on file  Social History Narrative   ** Merged History Encounter **       Family History  Problem Relation Age of Onset  . Heart attack Unknown   . Breast cancer Maternal Aunt 50    ASSESSMENT Recent Results: The most recent result is correlated with 35 mg per week: Lab Results  Component Value Date   INR 4.0 (A) 03/24/2018   INR 4.6 (A) 03/10/2018   INR 3.8 (A) 02/23/2018    Anticoagulation Dosing:   INR today: Supratherapeutic at 4.0  PLAN Weekly dose was decreased by 18% to 30 mg per week.  Discussed with patient the effects of decreasing smoking on INR. Educated patient that we will need to follow up with her next week to continue to monitor her levels.   Patient advised to contact clinic or seek medical attention if signs/symptoms of bleeding or thromboembolism occur.  Patient verbalized understanding by repeating back information and was advised to contact me if further  medication-related questions arise. Patient was also provided an information handout.  Follow-up Return in about 1 week (around 03/31/2018).  20 minutes spent face-to-face with the patient during the encounter.  Gwenlyn Found, Sherian Rein D PGY1 Pharmacy Resident  Phone [phone] 03/24/2018   5:22 PM

## 2018-03-24 NOTE — Patient Instructions (Signed)
Patient was educated as defined in the encounter.  

## 2018-03-24 NOTE — Assessment & Plan Note (Addendum)
10 days, feels like her chronic back pain. Reports needed to walk bent over. Previous x-rays with lumbar spinal stenosis. Has been taking her daughter's ibuprofen with minimal relief. Has had injections and epidurals in the past with minimal relief.  Denies night sweats, weight loss, up to date on mammograms. Denies urinary or bowel incontinence. Denies saddle anesthesia.    Focal tenderness over paraspinal region on exam. No midline tenderness. ROM intact.   Plan: - short term prescription for Baclofen - encouraged her to stay as active as possible - f/u if pain does not improve

## 2018-03-24 NOTE — Progress Notes (Signed)
   CC: back pain  HPI:  Kim Anthony is a 67 y.o. female with h/o breast cancer s/p mastectomy, chemo, rxt, CHF, COPD, HTN, HLD who presents for management of chronic diseases and evaluation of chronic back pain.   Past Medical History:  Diagnosis Date  . Breast cancer ([place])    Left Breast Cancer  . CHF (congestive heart failure) ([place])   . COPD (chronic obstructive pulmonary disease) ([place])   . Coronary artery disease   . History of mitral valve replacement with mechanical valve   . HLD (hyperlipidemia)   . Hypertension   . Pulmonary embolism (HCC)    Physical Exam:  Vitals:   03/24/18 1547  BP: 130/74  Pulse: 84  Temp: 99.2 F (37.3 C)  TempSrc: Oral  SpO2: 98%  Weight: 165 lb 3.2 oz (74.9 kg)  Height: 5\' 5"  (1.651 m)   Gen: Well appearing, NAD CV: RRR, no murmurs Pulm: Normal effort, CTA throughout, no wheezing Ext: Warm, no edema MSK: no swelling or erythema over low back, focal tenderness to palpation over right paraspinal muscles with underlying nodule, no tenderness over midline, negative straight leg raise. Walks bent over.    Assessment & Plan:   See Encounters Tab for problem based charting.  Patient seen with Dr. Evette Doffing

## 2018-03-24 NOTE — Patient Instructions (Addendum)
It was nice seeing you today. Thank you for choosing [facility] for your Primary Care.   Today we talked about:  1) Back pain: I sent a prescription for baclofen. You can also try ice and/or heat therapy. If your pain really impacts your ability to function day to day, let me know and we can do something else.   2) Keep on trying to stop smoking! It is one of the best things you can do for your health. Let me know if there's anything I can do to help with that. I sent the nicotine patches to your pharmacy.    FOLLOW-UP INSTRUCTIONS When: as needed for back pain  Please contact the clinic if you have any problems, or need to be seen sooner.

## 2018-03-25 NOTE — Progress Notes (Signed)
INTERNAL MEDICINE TEACHING ATTENDING ADDENDUM ° °I agree with pharmacy recommendations as outlined in their note.  ° °-Lameshia Hypolite MD ° °

## 2018-03-25 NOTE — Assessment & Plan Note (Signed)
Chronic smoker, 1 pack/week. Would like to quit. Reports good response with nicotine patches in the past   Plan: - prescribe nicotine patches 14mg  for 6 weeks

## 2018-03-25 NOTE — Assessment & Plan Note (Signed)
Given flu shot  today 

## 2018-03-28 NOTE — Addendum Note (Signed)
Addended by: Lalla Brothers T on: 03/28/2018 07:54 AM   Modules accepted: Level of Service

## 2018-03-28 NOTE — Progress Notes (Signed)
Internal Medicine Clinic Attending  I saw and evaluated the patient.  I personally confirmed the key portions of the history and exam documented by Dr. Vogel and I reviewed pertinent patient test results.  The assessment, diagnosis, and plan were formulated together and I agree with the documentation in the resident's note.  

## 2018-03-29 DIAGNOSIS — M19071 Primary osteoarthritis, right ankle and foot: Secondary | ICD-10-CM | POA: Diagnosis not present

## 2018-03-29 DIAGNOSIS — M19072 Primary osteoarthritis, left ankle and foot: Secondary | ICD-10-CM | POA: Diagnosis not present

## 2018-03-29 DIAGNOSIS — G5763 Lesion of plantar nerve, bilateral lower limbs: Secondary | ICD-10-CM | POA: Diagnosis not present

## 2018-03-31 ENCOUNTER — Ambulatory Visit (INDEPENDENT_AMBULATORY_CARE_PROVIDER_SITE_OTHER): Payer: Medicare Other | Admitting: Pharmacist

## 2018-03-31 ENCOUNTER — Other Ambulatory Visit: Payer: Self-pay | Admitting: Gastroenterology

## 2018-03-31 DIAGNOSIS — I2699 Other pulmonary embolism without acute cor pulmonale: Secondary | ICD-10-CM | POA: Diagnosis not present

## 2018-03-31 DIAGNOSIS — Z7901 Long term (current) use of anticoagulants: Secondary | ICD-10-CM | POA: Diagnosis not present

## 2018-03-31 DIAGNOSIS — Z5181 Encounter for therapeutic drug level monitoring: Secondary | ICD-10-CM | POA: Diagnosis not present

## 2018-03-31 LAB — POCT INR
INR: 2.2 (ref 2.0–3.0)
INR: 2.2 (ref 2.0–3.0)

## 2018-03-31 NOTE — Progress Notes (Signed)
Patient was seen in clinic by Gwenlyn Found, PharmD, PGY1 pharmacy resident. I agree with the assessment and plan of care documented.

## 2018-03-31 NOTE — Progress Notes (Addendum)
Anticoagulation Management Kim Anthony is a 67 y.o. female who reports to the clinic for monitoring of warfarin treatment.  Patient also expressed interest in smoking cessation therapy.  Patient understands the harms of tobacco use and states she wants to quit. Patient currently smokes about 7 cigarettes of [place] 100s daily. Patient informs me that she is most worried about quitting her first cigerrette of the day which she typically smokes about 10 minutes after waking up. She states she has not tried to quit or taper her cigarette use on her own.   Indication: PE and mechanical mitral valve Duration: indefinite Supervising physician: Lalla Brothers  Anticoagulation Clinic Visit History: Patient does not report signs/symptoms of bleeding or thromboembolism  Other recent changes: Patient reports eating collard greens this Sunday but no other changes to diet or medication changes in the past week.    Anticoagulation Episode Summary    Current INR goal:   2.5-3.5  TTR:   31.8 % (2.6 y)  Next INR check:   03/31/2018  INR from last check:   4.0! (03/24/2018)  Most recent INR:    2.2! (03/31/2018)  Weekly max warfarin dose:     Target end date:     INR check location:   Anticoagulation Clinic  Preferred lab:     Send INR reminders to:      Indications   Pulmonary embolism (HCC) [I26.99]       Comments:           Allergies  Allergen Reactions  . Acetaminophen-Codeine Itching  . Other Itching    Darvocet  . Tramadol Itching    vomitting   Prior to Admission medications   Medication Sig Start Date End Date Taking? Authorizing Provider  albuterol (PROVENTIL HFA;VENTOLIN HFA) 108 (90 Base) MCG/ACT inhaler Inhale 1-2 puffs into the lungs every 6 (six) hours as needed for wheezing or shortness of breath. 12/07/17   Lenore Cordia, MD  albuterol (PROVENTIL) (2.5 MG/3ML) 0.083% nebulizer solution Take 3 mLs (2.5 mg total) by nebulization every 6 (six) hours as needed for  wheezing or shortness of breath. 11/26/17   Katherine Roan, MD  Baclofen 5 MG TABS Take 5 mg by mouth every 8 (eight) hours as needed. 03/24/18   Isabelle Course, MD  calcium citrate-vitamin D (CITRACAL+D) 315-200 MG-UNIT tablet Take 2 tablets by mouth 2 (two) times daily. 01/07/18 07/06/18  Katherine Roan, MD  cetirizine (ZYRTEC) 10 MG tablet Take 1 tablet (10 mg total) by mouth daily. 05/27/17   Jule Ser, DO  diclofenac sodium (VOLTAREN) 1 % GEL Apply 2 g topically 3 (three) times daily as needed. 02/25/17   Alphonzo Grieve, MD  fluticasone (FLONASE) 50 MCG/ACT nasal spray Place 2 sprays into both nostrils daily. 12/03/16 12/03/17  Jule Ser, DO  furosemide (LASIX) 40 MG tablet TAKE 1 TABLET BY MOUTH ONCE DAILY 12/27/17   Sid Falcon, MD  gabapentin (NEURONTIN) 600 MG tablet Take 600 mg by mouth 3 (three) times daily. 09/23/17   [provider]  lisinopril (PRINIVIL,ZESTRIL) 10 MG tablet TAKE 1 TABLET BY MOUTH ONCE DAILY 03/14/18   Oda Kilts, MD  metoprolol succinate (TOPROL-XL) 25 MG 24 hr tablet TAKE 1/2 (ONE-HALF) TABLET BY MOUTH ONCE DAILY 01/29/18   Isabelle Course, MD  nicotine (NICODERM CQ - DOSED IN MG/24 HOURS) 14 mg/24hr patch Place 1 patch (14 mg total) onto the skin daily. 03/24/18 05/05/18  Isabelle Course, MD  potassium chloride 20 MEQ/15ML (  10%) SOLN Take 30 mLs (40 mEq total) by mouth daily. 12/08/17   Lenore Cordia, MD  simvastatin (ZOCOR) 20 MG tablet TAKE 1 TABLET BY MOUTH ONCE DAILY 03/14/18   Oda Kilts, MD  tiZANidine (ZANAFLEX) 4 MG tablet Take 4 mg by mouth 3 (three) times daily. 09/23/17   [provider]  umeclidinium-vilanterol (ANORO ELLIPTA) 62.5-25 MCG/INH AEPB Inhale 1 puff into the lungs daily. 11/26/17   Katherine Roan, MD  warfarin (COUMADIN) 5 MG tablet Take 1 tablet (5 mg total) by mouth daily. Except take 1/2 tablets (2.5 mg) Tuesdays and Thursdays 03/24/18   Axel Filler, MD   Past Medical History:  Diagnosis  Date  . Breast cancer ([place])    Left Breast Cancer  . CHF (congestive heart failure) ([place])   . COPD (chronic obstructive pulmonary disease) ([place])   . Coronary artery disease   . History of mitral valve replacement with mechanical valve   . HLD (hyperlipidemia)   . Hypertension   . Pulmonary embolism [facility])    Social History   Socioeconomic History  . Marital status: Single    Spouse name: Not on file  . Number of children: Not on file  . Years of education: Not on file  . Highest education level: Not on file  Occupational History  . Not on file  Social Needs  . Financial resource strain: Not on file  . Food insecurity:    Worry: Not on file    Inability: Not on file  . Transportation needs:    Medical: Not on file    Non-medical: Not on file  Tobacco Use  . Smoking status: Current Every Day Smoker    Packs/day: 0.50    Types: Cigarettes  . Smokeless tobacco: Never Used  . Tobacco comment: Sometimes less.  Substance and Sexual Activity  . Alcohol use: Yes    Comment: beer  . Drug use: No  . Sexual activity: Not on file  Lifestyle  . Physical activity:    Days per week: Not on file    Minutes per session: Not on file  . Stress: Not on file  Relationships  . Social connections:    Talks on phone: Not on file    Gets together: Not on file    Attends religious service: Not on file    Active member of club or organization: Not on file    Attends meetings of clubs or organizations: Not on file    Relationship status: Not on file  Other Topics Concern  . Not on file  Social History Narrative   ** Merged History Encounter **       Family History  Problem Relation Age of Onset  . Heart attack Unknown   . Breast cancer Maternal Aunt 50    ASSESSMENT Recent Results: The most recent result is correlated with 30 mg per week: Lab Results  Component Value Date   INR 2.2 03/31/2018   INR 4.0 (A) 03/24/2018   INR 4.6 (A) 03/10/2018    Anticoagulation Dosing:  Warfarin 5mg  daily execpt 2.5mg  on Tuesday and Thursday   INR today: Subtherapeutic at 2.2 (goal 2.5-3.5)  PLAN Anticoagulation:  Weekly dose was increased by 8.3% to 32.5 mg per week. Educated patient to increase tonight's dose from 2.5mg  (one-half tablet) to 5mg  (one whole tablet). (Warfarin 5 mg daily except 2.5mg  on Tuesday)  Smoking Cessation: -Thoroughly spoke with patient about the dangers of smoking and assessed patient for willingness  to quit. -Patient set a quit date of this Monday (04/04/18) -Provided patient with Nicotine 14mg  Patches and educated patient to apply one patch daily starting on her quit day. Educated patient to make sure remove prior patch before placing a new one. Informed patient of possible side effects. Patient communicated understanding.  -Provided patient with Nicorette gum 2 mg and informed patient to use 1 piece every 1-2 hours starting on her quit day. Educated patient on proper use of chewing gum and parking technique. Patient communicated understanding.  -Provided some methods to help distract from urge of smoking, like making a cup of coffee in the morning when she would normally smoke her first cigarette. -Will follow up on progress in one week   Patient advised to contact clinic or seek medical attention if signs/symptoms of bleeding or thromboembolism occur.   Patient verbalized understanding by repeating back information and was advised to contact me if further medication-related questions arise. Patient was also provided an information handout.  Follow-up Return in 1 week on 04/07/18  Gwenlyn Found, Sherian Rein D PGY1 Pharmacy Resident  Phone [phone] 03/31/2018   1:51 PM  30 minutes spent face-to-face with the patient during the encounter. 60% of time spent on education. 40% of time was spent on patient evaluation.

## 2018-03-31 NOTE — Progress Notes (Signed)
INTERNAL MEDICINE TEACHING ATTENDING ADDENDUM ° °I agree with pharmacy recommendations as outlined in their note.  ° °-Oluwaseyi Tull MD ° °

## 2018-03-31 NOTE — Patient Instructions (Signed)
1. Your INR was low today at 2.2 (your goal is 2.5-3.5) 2. Increase tonight's Warfarin dose to 5 mg (one full tablet) 3. Take Warfarin 5 mg (full tablet) daily except only take 2.5mg  (one-half of tablet) on Tuesday. 4. Start Nicotine Patches 14 mg daily on your set quit day of this Monday (samples provided) 5. Start Nicorette 2 mg gum every 1-2 hours as needed starting Monday (samples provided) 6. Follow up with Pharmacy Clinic on 04/07/18

## 2018-04-04 ENCOUNTER — Other Ambulatory Visit: Payer: Self-pay | Admitting: Internal Medicine

## 2018-04-05 ENCOUNTER — Other Ambulatory Visit: Payer: Self-pay | Admitting: *Deleted

## 2018-04-05 DIAGNOSIS — G5763 Lesion of plantar nerve, bilateral lower limbs: Secondary | ICD-10-CM | POA: Diagnosis not present

## 2018-04-05 NOTE — Telephone Encounter (Signed)
[facility] stated they did not receive rx written on 7/8 #90 x 1 refill. Please re-send. Thanks

## 2018-04-06 MED ORDER — FUROSEMIDE 40 MG PO TABS: 40.0000 mg | ORAL_TABLET | Freq: Every day | ORAL | 1 refills | Status: DC

## 2018-04-07 ENCOUNTER — Ambulatory Visit (INDEPENDENT_AMBULATORY_CARE_PROVIDER_SITE_OTHER): Payer: Medicare Other | Admitting: Pharmacist

## 2018-04-07 DIAGNOSIS — Z7901 Long term (current) use of anticoagulants: Secondary | ICD-10-CM

## 2018-04-07 DIAGNOSIS — Z5181 Encounter for therapeutic drug level monitoring: Secondary | ICD-10-CM

## 2018-04-07 DIAGNOSIS — I2699 Other pulmonary embolism without acute cor pulmonale: Secondary | ICD-10-CM | POA: Diagnosis not present

## 2018-04-07 LAB — POCT INR: INR: 2.4 (ref 2.0–3.0)

## 2018-04-07 NOTE — Progress Notes (Signed)
Anticoagulation Management Kim Anthony is a 67 y.o. female who reports to the clinic for monitoring of warfarin treatment.   Patient also is following up for smoking cessation. Patient reports being unable to quit on her planned quit day this week. Instead, the patient reports increasing cigarette use per day because of increased stresses this week.    Indication: history of PE, DVT, and mechanical mitral valve Duration: indefinite Supervising physician: Lalla Brothers  Anticoagulation Clinic Visit History: Patient does not report signs/symptoms of bleeding or thromboembolism  Other recent changes: Patient reports no changes in diet, medications, lifestyle Anticoagulation Episode Summary    Current INR goal:   2.5-3.5  TTR:   31.6 % (2.6 y)  Next INR check:   04/19/2018  INR from last check:   2.4! (04/07/2018)  Weekly max warfarin dose:     Target end date:     INR check location:   Anticoagulation Clinic  Preferred lab:     Send INR reminders to:      Indications   Pulmonary embolism (HCC) [I26.99]       Comments:           Allergies  Allergen Reactions  . Acetaminophen-Codeine Itching  . Other Itching    Darvocet  . Tramadol Itching    vomitting   Prior to Admission medications   Medication Sig Start Date End Date Taking? Authorizing Provider  albuterol (PROVENTIL HFA;VENTOLIN HFA) 108 (90 Base) MCG/ACT inhaler Inhale 1-2 puffs into the lungs every 6 (six) hours as needed for wheezing or shortness of breath. 12/07/17   Lenore Cordia, MD  albuterol (PROVENTIL) (2.5 MG/3ML) 0.083% nebulizer solution Take 3 mLs (2.5 mg total) by nebulization every 6 (six) hours as needed for wheezing or shortness of breath. 11/26/17   Katherine Roan, MD  Baclofen 5 MG TABS Take 5 mg by mouth every 8 (eight) hours as needed. 03/24/18   Isabelle Course, MD  calcium citrate-vitamin D (CITRACAL+D) 315-200 MG-UNIT tablet Take 2 tablets by mouth 2 (two) times daily. 01/07/18 07/06/18  Katherine Roan, MD  cetirizine (ZYRTEC) 10 MG tablet Take 1 tablet (10 mg total) by mouth daily. 05/27/17   Jule Ser, DO  diclofenac sodium (VOLTAREN) 1 % GEL Apply 2 g topically 3 (three) times daily as needed. 02/25/17   Alphonzo Grieve, MD  fluticasone (FLONASE) 50 MCG/ACT nasal spray Place 2 sprays into both nostrils daily. 12/03/16 12/03/17  Jule Ser, DO  furosemide (LASIX) 40 MG tablet Take 1 tablet (40 mg total) by mouth daily. 04/06/18   Isabelle Course, MD  gabapentin (NEURONTIN) 600 MG tablet Take 600 mg by mouth 3 (three) times daily. 09/23/17   [provider]  lisinopril (PRINIVIL,ZESTRIL) 10 MG tablet TAKE 1 TABLET BY MOUTH ONCE DAILY 03/14/18   Oda Kilts, MD  metoprolol succinate (TOPROL-XL) 25 MG 24 hr tablet TAKE 1/2 (ONE-HALF) TABLET BY MOUTH ONCE DAILY 01/29/18   Isabelle Course, MD  nicotine (NICODERM CQ - DOSED IN MG/24 HOURS) 14 mg/24hr patch Place 1 patch (14 mg total) onto the skin daily. 03/24/18 05/05/18  Isabelle Course, MD  potassium chloride 20 MEQ/15ML (10%) SOLN Take 30 mLs (40 mEq total) by mouth daily. 12/08/17   Lenore Cordia, MD  simvastatin (ZOCOR) 20 MG tablet TAKE 1 TABLET BY MOUTH ONCE DAILY 03/14/18   Oda Kilts, MD  tiZANidine (ZANAFLEX) 4 MG tablet Take 4 mg by mouth 3 (three) times daily. 09/23/17  [provider]  umeclidinium-vilanterol (ANORO ELLIPTA) 62.5-25 MCG/INH AEPB Inhale 1 puff into the lungs daily. 11/26/17   Katherine Roan, MD  warfarin (COUMADIN) 5 MG tablet Take 1 tablet (5 mg total) by mouth daily. Except take 1/2 tablets (2.5 mg) Tuesdays and Thursdays 03/24/18   Axel Filler, MD   Past Medical History:  Diagnosis Date  . Breast cancer ([place])    Left Breast Cancer  . CHF (congestive heart failure) ([place])   . COPD (chronic obstructive pulmonary disease) ([place])   . Coronary artery disease   . History of mitral valve replacement with mechanical valve   . HLD (hyperlipidemia)   . Hypertension   .  Pulmonary embolism [facility])    Social History   Socioeconomic History  . Marital status: Single    Spouse name: Not on file  . Number of children: Not on file  . Years of education: Not on file  . Highest education level: Not on file  Occupational History  . Not on file  Social Needs  . Financial resource strain: Not on file  . Food insecurity:    Worry: Not on file    Inability: Not on file  . Transportation needs:    Medical: Not on file    Non-medical: Not on file  Tobacco Use  . Smoking status: Current Every Day Smoker    Packs/day: 0.50    Types: Cigarettes  . Smokeless tobacco: Never Used  . Tobacco comment: Sometimes less.  Substance and Sexual Activity  . Alcohol use: Yes    Comment: beer  . Drug use: No  . Sexual activity: Not on file  Lifestyle  . Physical activity:    Days per week: Not on file    Minutes per session: Not on file  . Stress: Not on file  Relationships  . Social connections:    Talks on phone: Not on file    Gets together: Not on file    Attends religious service: Not on file    Active member of club or organization: Not on file    Attends meetings of clubs or organizations: Not on file    Relationship status: Not on file  Other Topics Concern  . Not on file  Social History Narrative   ** Merged History Encounter **       Family History  Problem Relation Age of Onset  . Heart attack Unknown   . Breast cancer Maternal Aunt 50    ASSESSMENT Recent Results: The most recent result is correlated with 32.5 mg per week: Lab Results  Component Value Date   INR 2.4 04/07/2018   INR 2.2 03/31/2018   INR 2.2 03/31/2018    Anticoagulation Dosing: Warfarin 5mg  daily except 2.5mg  on Tuesdays    INR today: Subtherapeutic at 2.4 (goal of 2.5-3.5)  PLAN 1. Weekly dose was increased by 7% to 35 mg per week. Educated patient to increase weekly warfarin dose by taking Warfarin 5mg  daily. Patient communicated understanding.  2. Patient set a  new quit date for tomorrow. Re-educated patient on proper use of Nicotine patches and Nicotine gum. We came up with more methods on how to decrease urges to smoke.  3. Obtain Lipid panel and BMET at next patient visit.    Patient Instructions  1. Your INR today was 2.4 (goal 2.5-3.5) 2. Increase warfarin weekly dose by taking Warfarin 5mg  daily 3. Start Nicotine patches 14mg  daily and nicotine gum as needed starting tomorrow 4. Follow  up with pharmacy clinic on 10/29  Patient advised to contact clinic or seek medical attention if signs/symptoms of bleeding or thromboembolism occur.  Patient verbalized understanding by repeating back information and was advised to contact me if further medication-related questions arise. Patient was also provided an information handout.  Follow-up Return in about 2 weeks (around 04/21/2018).  Gwenlyn Found, Sherian Rein D PGY1 Pharmacy Resident  Phone [phone] 04/07/2018   4:31 PM  30 minutes spent face-to-face with the patient during the encounter. 50% of time spent on education. 50% of time was spent on medication adjustments.

## 2018-04-07 NOTE — Patient Instructions (Addendum)
1. Your INR today was 2.4 (goal 2.5-3.5) 2. Increase warfarin weekly dose by taking Warfarin 5mg  daily 3. Start Nicotine patches 14mg  daily and nicotine gum as needed starting tomorrow 4. Follow up with pharmacy clinic on 10/29

## 2018-04-08 NOTE — Progress Notes (Signed)
INTERNAL MEDICINE TEACHING ATTENDING ADDENDUM ° °I agree with pharmacy recommendations as outlined in their note.  ° °-Duncan Vincent MD ° °

## 2018-04-11 ENCOUNTER — Encounter (HOSPITAL_COMMUNITY): Payer: Self-pay

## 2018-04-11 ENCOUNTER — Emergency Department (HOSPITAL_COMMUNITY)
Admission: EM | Admit: 2018-04-11 | Discharge: 2018-04-11 | Disposition: A | Discharge status: Home / Self Care | Payer: Medicare Other | Attending: Emergency Medicine | Admitting: Emergency Medicine

## 2018-04-11 ENCOUNTER — Emergency Department (HOSPITAL_COMMUNITY): Payer: Medicare Other

## 2018-04-11 ENCOUNTER — Other Ambulatory Visit: Payer: Self-pay

## 2018-04-11 ENCOUNTER — Telehealth: Payer: Self-pay | Admitting: Pharmacist

## 2018-04-11 DIAGNOSIS — Z7901 Long term (current) use of anticoagulants: Secondary | ICD-10-CM | POA: Diagnosis not present

## 2018-04-11 DIAGNOSIS — Z853 Personal history of malignant neoplasm of breast: Secondary | ICD-10-CM | POA: Insufficient documentation

## 2018-04-11 DIAGNOSIS — J45909 Unspecified asthma, uncomplicated: Secondary | ICD-10-CM | POA: Diagnosis not present

## 2018-04-11 DIAGNOSIS — J449 Chronic obstructive pulmonary disease, unspecified: Secondary | ICD-10-CM | POA: Diagnosis not present

## 2018-04-11 DIAGNOSIS — I5042 Chronic combined systolic (congestive) and diastolic (congestive) heart failure: Secondary | ICD-10-CM | POA: Insufficient documentation

## 2018-04-11 DIAGNOSIS — I110 Hypertensive heart disease with heart failure: Secondary | ICD-10-CM | POA: Insufficient documentation

## 2018-04-11 DIAGNOSIS — R079 Chest pain, unspecified: Secondary | ICD-10-CM | POA: Diagnosis not present

## 2018-04-11 DIAGNOSIS — R0789 Other chest pain: Secondary | ICD-10-CM | POA: Diagnosis not present

## 2018-04-11 DIAGNOSIS — F17210 Nicotine dependence, cigarettes, uncomplicated: Secondary | ICD-10-CM | POA: Diagnosis not present

## 2018-04-11 DIAGNOSIS — Z79899 Other long term (current) drug therapy: Secondary | ICD-10-CM | POA: Insufficient documentation

## 2018-04-11 DIAGNOSIS — R0602 Shortness of breath: Secondary | ICD-10-CM | POA: Diagnosis not present

## 2018-04-11 DIAGNOSIS — I2510 Atherosclerotic heart disease of native coronary artery without angina pectoris: Secondary | ICD-10-CM | POA: Diagnosis not present

## 2018-04-11 HISTORY — DX: Unspecified asthma, uncomplicated: J45.909

## 2018-04-11 LAB — CBC WITH DIFFERENTIAL/PLATELET
ABS IMMATURE GRANULOCYTES: 0.07 10*3/uL (ref 0.00–0.07)
BASOS PCT: 1 %
Basophils Absolute: 0.1 10*3/uL (ref 0.0–0.1)
Eosinophils Absolute: 0.1 10*3/uL (ref 0.0–0.5)
Eosinophils Relative: 1 %
HCT: 35.1 % — ABNORMAL LOW (ref 36.0–46.0)
Hemoglobin: 10.8 g/dL — ABNORMAL LOW (ref 12.0–15.0)
IMMATURE GRANULOCYTES: 1 %
Lymphocytes Relative: 29 %
Lymphs Abs: 3.8 10*3/uL (ref 0.7–4.0)
MCH: 31.2 pg (ref 26.0–34.0)
MCHC: 30.8 g/dL (ref 30.0–36.0)
MCV: 101.4 fL — ABNORMAL HIGH (ref 80.0–100.0)
Monocytes Absolute: 1 10*3/uL (ref 0.1–1.0)
Monocytes Relative: 8 %
NEUTROS PCT: 60 %
Neutro Abs: 8.1 10*3/uL — ABNORMAL HIGH (ref 1.7–7.7)
Platelets: 443 10*3/uL — ABNORMAL HIGH (ref 150–400)
RBC: 3.46 MIL/uL — AB (ref 3.87–5.11)
RDW: 12.8 % (ref 11.5–15.5)
WBC: 13.2 10*3/uL — AB (ref 4.0–10.5)
nRBC: 0 % (ref 0.0–0.2)

## 2018-04-11 LAB — PROTIME-INR
INR: 1.97
Prothrombin Time: 22.2 seconds — ABNORMAL HIGH (ref 11.4–15.2)

## 2018-04-11 LAB — BASIC METABOLIC PANEL
ANION GAP: 8 (ref 5–15)
BUN: 25 mg/dL — ABNORMAL HIGH (ref 8–23)
CO2: 27 mmol/L (ref 22–32)
Calcium: 9.3 mg/dL (ref 8.9–10.3)
Chloride: 104 mmol/L (ref 98–111)
Creatinine, Ser: 0.99 mg/dL (ref 0.44–1.00)
GFR calc Af Amer: >60 mL/min (ref >60)
GFR, EST NON AFRICAN AMERICAN: 57 mL/min — AB (ref >60)
Glucose, Bld: 115 mg/dL — ABNORMAL HIGH (ref 70–99)
POTASSIUM: 4.1 mmol/L (ref 3.5–5.1)
SODIUM: 139 mmol/L (ref 135–145)

## 2018-04-11 LAB — I-STAT TROPONIN, ED: Troponin i, poc: 0.02 ng/mL (ref 0.00–0.08)

## 2018-04-11 LAB — TROPONIN I: Troponin I: 0.03 ng/mL (ref <0.03)

## 2018-04-11 LAB — BRAIN NATRIURETIC PEPTIDE: B NATRIURETIC PEPTIDE 5: 74.6 pg/mL (ref 0.0–100.0)

## 2018-04-11 MED ORDER — MORPHINE SULFATE (PF) 4 MG/ML IV SOLN
4.0000 mg | Freq: Once | INTRAVENOUS | Status: AC
  Administered 2018-04-11: 4 mg via INTRAVENOUS
  Filled 2018-04-11: qty 1

## 2018-04-11 MED ORDER — HYDROCODONE-ACETAMINOPHEN 5-325 MG PO TABS
1.0000 | ORAL_TABLET | Freq: Once | ORAL | Status: AC
  Administered 2018-04-11: 1 via ORAL
  Filled 2018-04-11: qty 1

## 2018-04-11 NOTE — ED Provider Notes (Signed)
[facility] EMERGENCY DEPARTMENT Provider Note   CSN: [id number] Arrival date & time: 04/11/18  0243     History   Chief Complaint Chief Complaint  Patient presents with  . Shortness of Breath    HPI Kim Anthony is a 67 y.o. female.  Patient is a 67 year old female with extensive past medical history including CHF, COPD, mitral valve replacement for which she is on Coumadin, prior PE, hypertension, and breast cancer.  She presents today for evaluation of chest discomfort.  She woke at approximately 1 AM with a sensation of tightness to the left chest and difficulty breathing.  She tried a nebulizer treatment with little relief.  She denies any fevers, chills, or cough.  The history is provided by the patient.  Shortness of Breath  This is a new problem. The average episode lasts 2 hours. The problem occurs continuously.The problem has not changed since onset.Associated symptoms include chest pain. Pertinent negatives include no fever, no cough, no sputum production, no leg pain and no leg swelling. She has tried nothing for the symptoms.    Past Medical History:  Diagnosis Date  . Asthma   . Breast cancer ([place])    Left Breast Cancer  . CHF (congestive heart failure) ([place])   . COPD (chronic obstructive pulmonary disease) ([place])   . Coronary artery disease   . History of mitral valve replacement with mechanical valve   . HLD (hyperlipidemia)   . Hypertension   . Pulmonary embolism [facility])     Patient Active Problem List   Diagnosis Date Noted  . COPD (chronic obstructive pulmonary disease) ([place]) 11/25/2017  . S/P MVR (mitral valve replacement)   . Chronic anticoagulation   . Hx of breast cancer 05/27/2017  . Muscle spasm 02/25/2017  . Soft tissue mass 02/25/2017  . Tobacco use disorder 02/25/2017  . Lymphadenopathy, mediastinal 02/25/2017  . History of mitral valve replacement with mechanical valve 11/18/2016  . Essential hypertension 02/01/2016  .  Allergic rhinitis 02/01/2016  . Healthcare maintenance 01/30/2016  . Chronic low back pain 07/22/2015  . Pulmonary embolism ([place]) 07/11/2015  . Chronic combined systolic and diastolic congestive heart failure ([place]) 07/11/2015  . Headache, common migraine 07/11/2015  . HLD (hyperlipidemia) 07/11/2015  . Hypokalemia 07/11/2015    Past Surgical History:  Procedure Laterality Date  . MASTECTOMY Left 2000s   for breast cancer, also s/p radiation and chemo  . MECHANICAL AORTIC AND MITRAL VALVE REPLACEMENT       OB History   None      Home Medications    Prior to Admission medications   Medication Sig Start Date End Date Taking? Authorizing Provider  albuterol (PROVENTIL HFA;VENTOLIN HFA) 108 (90 Base) MCG/ACT inhaler Inhale 1-2 puffs into the lungs every 6 (six) hours as needed for wheezing or shortness of breath. 12/07/17   Lenore Cordia, MD  albuterol (PROVENTIL) (2.5 MG/3ML) 0.083% nebulizer solution Take 3 mLs (2.5 mg total) by nebulization every 6 (six) hours as needed for wheezing or shortness of breath. 11/26/17   Katherine Roan, MD  Baclofen 5 MG TABS Take 5 mg by mouth every 8 (eight) hours as needed. 03/24/18   Isabelle Course, MD  calcium citrate-vitamin D (CITRACAL+D) 315-200 MG-UNIT tablet Take 2 tablets by mouth 2 (two) times daily. 01/07/18 07/06/18  Katherine Roan, MD  cetirizine (ZYRTEC) 10 MG tablet Take 1 tablet (10 mg total) by mouth daily. 05/27/17   Jule Ser, DO  diclofenac sodium (VOLTAREN)  1 % GEL Apply 2 g topically 3 (three) times daily as needed. 02/25/17   Alphonzo Grieve, MD  fluticasone (FLONASE) 50 MCG/ACT nasal spray Place 2 sprays into both nostrils daily. 12/03/16 12/03/17  Jule Ser, DO  furosemide (LASIX) 40 MG tablet Take 1 tablet (40 mg total) by mouth daily. 04/06/18   Isabelle Course, MD  gabapentin (NEURONTIN) 600 MG tablet Take 600 mg by mouth 3 (three) times daily. 09/23/17   [provider]  lisinopril (PRINIVIL,ZESTRIL) 10 MG  tablet TAKE 1 TABLET BY MOUTH ONCE DAILY 03/14/18   Oda Kilts, MD  metoprolol succinate (TOPROL-XL) 25 MG 24 hr tablet TAKE 1/2 (ONE-HALF) TABLET BY MOUTH ONCE DAILY 01/29/18   Isabelle Course, MD  nicotine (NICODERM CQ - DOSED IN MG/24 HOURS) 14 mg/24hr patch Place 1 patch (14 mg total) onto the skin daily. 03/24/18 05/05/18  Isabelle Course, MD  potassium chloride 20 MEQ/15ML (10%) SOLN Take 30 mLs (40 mEq total) by mouth daily. 12/08/17   Lenore Cordia, MD  simvastatin (ZOCOR) 20 MG tablet TAKE 1 TABLET BY MOUTH ONCE DAILY 03/14/18   Oda Kilts, MD  tiZANidine (ZANAFLEX) 4 MG tablet Take 4 mg by mouth 3 (three) times daily. 09/23/17   [provider]  umeclidinium-vilanterol (ANORO ELLIPTA) 62.5-25 MCG/INH AEPB Inhale 1 puff into the lungs daily. 11/26/17   Katherine Roan, MD  warfarin (COUMADIN) 5 MG tablet Take 1 tablet (5 mg total) by mouth daily. Except take 1/2 tablets (2.5 mg) Tuesdays and Thursdays 03/24/18   Axel Filler, MD    Family History Family History  Problem Relation Age of Onset  . Heart attack Unknown   . Breast cancer Maternal Aunt 29    Social History Social History   Tobacco Use  . Smoking status: Current Every Day Smoker    Packs/day: 0.50    Types: Cigarettes  . Smokeless tobacco: Never Used  . Tobacco comment: Sometimes less.  Substance Use Topics  . Alcohol use: Yes    Comment: beer  . Drug use: No     Allergies   Acetaminophen-codeine; Other; and Tramadol   Review of Systems Review of Systems  Constitutional: Negative for fever.  Respiratory: Positive for shortness of breath. Negative for cough and sputum production.   Cardiovascular: Positive for chest pain. Negative for leg swelling.  All other systems reviewed and are negative.    Physical Exam Updated Vital Signs BP 121/78 (BP Location: Right Arm)   Pulse 67   Temp 98.6 F (37 C) (Oral)   Resp (!) 21   Ht 5\' 5"  (1.651 m)   Wt 74.8 kg   SpO2 96%    BMI 27.46 kg/m   Physical Exam  Constitutional: She is oriented to person, place, and time. She appears well-developed and well-nourished. No distress.  HENT:  Head: Normocephalic and atraumatic.  Neck: Normal range of motion. Neck supple.  Cardiovascular: Normal rate and regular rhythm. Exam reveals no gallop and no friction rub.  No murmur heard. Pulmonary/Chest: Effort normal and breath sounds normal. No tachypnea. No respiratory distress. She has no wheezes. She has no rhonchi. She has no rales.  Abdominal: Soft. Bowel sounds are normal. She exhibits no distension. There is no tenderness.  Musculoskeletal: Normal range of motion.       Right lower leg: Normal. She exhibits no tenderness and no edema.       Left lower leg: Normal. She exhibits no tenderness and no  edema.  Neurological: She is alert and oriented to person, place, and time.  Skin: Skin is warm and dry. She is not diaphoretic.  Nursing note and vitals reviewed.    ED Treatments / Results  Labs (all labs ordered are listed, but only abnormal results are displayed) Labs Reviewed  BASIC METABOLIC PANEL  CBC WITH DIFFERENTIAL/PLATELET  BRAIN NATRIURETIC PEPTIDE  PROTIME-INR    EKG EKG Interpretation  Date/Time:  Monday April 11 2018 02:53:02 EDT Ventricular Rate:  61 PR Interval:    QRS Duration: 139 QT Interval:  480 QTC Calculation: 484 R Axis:   19 Text Interpretation:  Sinus rhythm Right bundle branch block Nonspecific repol abnormality, lateral leads Baseline wander in lead(s) I V5 V6 Confirmed by Veryl Speak [phone]) on 04/11/2018 3:13:38 AM   Radiology No results found.  Procedures Procedures (including critical care time)  Medications Ordered in ED Medications  morphine 4 MG/ML injection 4 mg (has no administration in time range)     Initial Impression / Assessment and Plan / ED Course  I have reviewed the triage vital signs and the nursing notes.  Pertinent labs & imaging results  that were available during my care of the patient were reviewed by me and considered in my medical decision making (see chart for details).  Patient is a 67 year old female with extensive past medical history as described in the HPI.  She presents with shortness of breath and left-sided chest discomfort that woke her from sleep.  She is feeling somewhat better after observation in the ER.  She has had 2- troponin and her EKG is unchanged.  She is now feeling better.  I highly doubt an acute cardiac episode.  She does have a history of PE, however her INR is 2 on Coumadin and there is nothing in her vital signs or history that would suggest this.  At this point, I feel as though she is stable for discharge.  She is to follow-up as needed if she is not improving.  Final Clinical Impressions(s) / ED Diagnoses   Final diagnoses:  None    ED Discharge Orders    None       Veryl Speak, MD 04/11/18 [phone]

## 2018-04-11 NOTE — ED Triage Notes (Addendum)
Patient from home c/o SOB since 0100 this morning. Hx. Asthma and COPD (does not use home O2). Patient reports trying inhaler at home without relief. Patient also reports LEFT sided chest pain which she describes as an "ache". Denies nausea, vomiting, abdominal pain, or dysuria.

## 2018-04-11 NOTE — Discharge Instructions (Addendum)
Continue medications as previously prescribed.  Return to the emergency department if you develop severe chest pain, worsening breathing, high fever, or other new and concerning symptoms.

## 2018-04-11 NOTE — Telephone Encounter (Signed)
67 yo F with a history of PEs, DVTs, and mechanical mitral valve. Patient went to the ED yesterday with chief complaints of shortness of breath. INR was subtherapeutic 1.97 (goal of 2.5-3.5), however patient was not admitted and no changes were made to warfarin dose.   I spoke with patient on the phone who reports she is feeling better and has not been short of breath since the admission. Patient reports compliance with warfarin at 5mg  daily with no reported missed doses.   Plan:  -Start Lovenox 120mg  daily until your next appointment  -Scheduled appointment for Thursday at [facility], [place] D PGY1 Pharmacy Resident  Phone [phone] 04/11/2018   1:26 PM

## 2018-04-14 ENCOUNTER — Ambulatory Visit (INDEPENDENT_AMBULATORY_CARE_PROVIDER_SITE_OTHER): Payer: Medicare Other | Admitting: Pharmacist

## 2018-04-14 DIAGNOSIS — Z86718 Personal history of other venous thrombosis and embolism: Secondary | ICD-10-CM | POA: Diagnosis not present

## 2018-04-14 DIAGNOSIS — E785 Hyperlipidemia, unspecified: Secondary | ICD-10-CM

## 2018-04-14 DIAGNOSIS — Z952 Presence of prosthetic heart valve: Secondary | ICD-10-CM | POA: Diagnosis not present

## 2018-04-14 DIAGNOSIS — Z86711 Personal history of pulmonary embolism: Secondary | ICD-10-CM

## 2018-04-14 DIAGNOSIS — Z7901 Long term (current) use of anticoagulants: Secondary | ICD-10-CM | POA: Diagnosis not present

## 2018-04-14 DIAGNOSIS — I2699 Other pulmonary embolism without acute cor pulmonale: Secondary | ICD-10-CM

## 2018-04-14 DIAGNOSIS — Z5181 Encounter for therapeutic drug level monitoring: Secondary | ICD-10-CM

## 2018-04-14 LAB — POCT INR: INR: 1.9 — AB (ref 2.0–3.0)

## 2018-04-14 NOTE — Patient Instructions (Signed)
1. Your INR today was lower than goal at 1.9 (your goal is 2.5-3.5).  2. Increase your Warfarin dose to 5mg  every day except 7.5mg  on Tuesdays and Thursdays.  3. Monitor for any signs of bleeding or clotting and contact the office if you experience this.  4. Congratulations on quitting smoking! Continue to wear your nicotine patch daily and you can use 1 piece of nicotine gum every 1-2 hours.  5. Follow up with Pharmacy Clinic in 1 week.

## 2018-04-14 NOTE — Progress Notes (Addendum)
Anticoagulation Management Kim Anthony is a 67 y.o. female who reports to the clinic for monitoring of warfarin treatment.    Indication: history of PE, DVT, and mechanical mitral valve Duration: indefinite Supervising physician: Aldine Contes  Anticoagulation Clinic Visit History: Patient does not report any current signs/symptoms of bleeding or thromboembolism. Patient recently did have a visit to the ED for shortness of breath, however she was not admitted and reports symptoms have resolved since. Patient's INR at the ED was 1.97, however this was not caught by the ED as subtherapeutic for Kim Anthony before being discharged. This was noticed by pharmacy clinic and I called Kim Anthony to follow up and to instruct patient to use Lovenox daily and make an appointment as soon as possible.   Other recent changes: Denies any changes in diet, medications, or lifestyle  Other Clinic Visit Information:  Patient states that she quit smoking 4 days ago and started using her Nicotine Patches and gum. She does report that she did have 2 cigarettes yesterday, but is committed to using the patches again starting today. Patient reports the nicotine patches and gum helps and states she uses about 5-6 pieces of gum per day.  Anticoagulation Episode Summary    Current INR goal:   2.5-3.5  TTR:   31.4 % (2.7 y)  Next INR check:   04/19/2018  INR from last check:   1.9! (04/14/2018)  Weekly max warfarin dose:     Target end date:     INR check location:   Anticoagulation Clinic  Preferred lab:     Send INR reminders to:      Indications   Pulmonary embolism ([facility]) [I26.99]       Comments:           Allergies  Allergen Reactions  . Acetaminophen-Codeine Itching  . Other Itching    Darvocet  . Tramadol Itching    vomitting   Prior to Admission medications   Medication Sig Start Date End Date Taking? Authorizing Provider  albuterol (PROVENTIL HFA;VENTOLIN HFA) 108 (90 Base) MCG/ACT inhaler  Inhale 1-2 puffs into the lungs every 6 (six) hours as needed for wheezing or shortness of breath. 12/07/17   Lenore Cordia, MD  albuterol (PROVENTIL) (2.5 MG/3ML) 0.083% nebulizer solution Take 3 mLs (2.5 mg total) by nebulization every 6 (six) hours as needed for wheezing or shortness of breath. 11/26/17   Katherine Roan, MD  Baclofen 5 MG TABS Take 5 mg by mouth every 8 (eight) hours as needed. Patient taking differently: Take 5 mg by mouth every 8 (eight) hours as needed (for spasms).  03/24/18   Isabelle Course, MD  calcium citrate-vitamin D (CITRACAL+D) 315-200 MG-UNIT tablet Take 2 tablets by mouth 2 (two) times daily. 01/07/18 07/06/18  Katherine Roan, MD  cetirizine (ZYRTEC) 10 MG tablet Take 1 tablet (10 mg total) by mouth daily. 05/27/17   Jule Ser, DO  diclofenac sodium (VOLTAREN) 1 % GEL Apply 2 g topically 3 (three) times daily as needed. Patient not taking: Reported on 04/11/2018 02/25/17   Alphonzo Grieve, MD  fluticasone (FLONASE) 50 MCG/ACT nasal spray Place 2 sprays into both nostrils daily. 12/03/16 04/11/26  Jule Ser, DO  furosemide (LASIX) 40 MG tablet Take 1 tablet (40 mg total) by mouth daily. 04/06/18   Isabelle Course, MD  gabapentin (NEURONTIN) 600 MG tablet Take 600 mg by mouth 3 (three) times daily. 09/23/17   [provider]  lisinopril (PRINIVIL,ZESTRIL) 10 MG tablet  TAKE 1 TABLET BY MOUTH ONCE DAILY Patient taking differently: Take 10 mg by mouth daily.  03/14/18   Oda Kilts, MD  metoprolol succinate (TOPROL-XL) 25 MG 24 hr tablet TAKE 1/2 (ONE-HALF) TABLET BY MOUTH ONCE DAILY Patient taking differently: Take 12.5 mg by mouth daily.  01/29/18   Isabelle Course, MD  nicotine (NICODERM CQ - DOSED IN MG/24 HOURS) 14 mg/24hr patch Place 1 patch (14 mg total) onto the skin daily. 03/24/18 05/05/18  Isabelle Course, MD  potassium chloride 20 MEQ/15ML (10%) SOLN Take 30 mLs (40 mEq total) by mouth daily. 12/08/17   Lenore Cordia, MD  simvastatin  (ZOCOR) 20 MG tablet TAKE 1 TABLET BY MOUTH ONCE DAILY Patient taking differently: Take 20 mg by mouth daily at 6 PM.  03/14/18   Oda Kilts, MD  tiZANidine (ZANAFLEX) 4 MG tablet Take 4 mg by mouth 3 (three) times daily. 09/23/17   [provider]  umeclidinium-vilanterol (ANORO ELLIPTA) 62.5-25 MCG/INH AEPB Inhale 1 puff into the lungs daily. 11/26/17   Katherine Roan, MD  warfarin (COUMADIN) 5 MG tablet Take 1 tablet (5 mg total) by mouth daily. Except take 1/2 tablets (2.5 mg) Tuesdays and Thursdays Patient taking differently: Take 5 mg by mouth daily.  03/24/18   Axel Filler, MD   Past Medical History:  Diagnosis Date  . Asthma   . Breast cancer ([place])    Left Breast Cancer  . CHF (congestive heart failure) ([place])   . COPD (chronic obstructive pulmonary disease) ([place])   . Coronary artery disease   . History of mitral valve replacement with mechanical valve   . HLD (hyperlipidemia)   . Hypertension   . Pulmonary embolism [facility])    Social History   Socioeconomic History  . Marital status: Single    Spouse name: Not on file  . Number of children: Not on file  . Years of education: Not on file  . Highest education level: Not on file  Occupational History  . Not on file  Social Needs  . Financial resource strain: Not on file  . Food insecurity:    Worry: Not on file    Inability: Not on file  . Transportation needs:    Medical: Not on file    Non-medical: Not on file  Tobacco Use  . Smoking status: Current Every Day Smoker    Packs/day: 0.50    Types: Cigarettes  . Smokeless tobacco: Never Used  . Tobacco comment: Sometimes less.  Substance and Sexual Activity  . Alcohol use: Yes    Comment: beer  . Drug use: No  . Sexual activity: Not on file  Lifestyle  . Physical activity:    Days per week: Not on file    Minutes per session: Not on file  . Stress: Not on file  Relationships  . Social connections:    Talks on phone: Not on file     Gets together: Not on file    Attends religious service: Not on file    Active member of club or organization: Not on file    Attends meetings of clubs or organizations: Not on file    Relationship status: Not on file  Other Topics Concern  . Not on file  Social History Narrative   ** Merged History Encounter **       Family History  Problem Relation Age of Onset  . Heart attack Unknown   . Breast cancer Maternal Aunt  50    ASSESSMENT Recent Results: The most recent result is correlated with 35 mg per week: Lab Results  Component Value Date   INR 1.9 (A) 04/14/2018   INR 1.97 04/11/2018   INR 2.4 04/07/2018    Anticoagulation Dosing:   INR today: Subtherapeutic  PLAN 1. Weekly dose was increased by 14.3% to 40 mg per week. Patient provided with dosing sheet instructing patient to take 5mg  (1 tab) daily except 7.5mg  (1.5 tabs) on Tuesdays and Thursdays. Patient communicated understanding.  2. Instructed patient to continue Lovenox injections until next visit.  3. Advised patient to monitor for signs/symptoms of bleeding or clotting and to call the clinic if she experiences any of these symptoms.  4. Congratulated patient on her progress of quitting smoking. Encouraged patient to continue to use the Nicotine patches and gum to help her quit and decrease withdrawal symptoms. Patient seems motivated to continue attempting to quit after smoking 2 cigarettes yesterday. Educated patient that she can use up to 1 piece of gum per hour. Patient communicated understanding. Provided patient with Nicotine patch and nicotine gum samples.  5. Obtained lipid panel at visit.    Patient Instructions  1. Your INR today was lower than goal at 1.9 (your goal is 2.5-3.5).  2. Increase your Warfarin dose to 5mg  every day except 7.5mg  on Tuesdays and Thursdays.  3. Monitor for any signs of bleeding or clotting and contact the office if you experience this.  4. Congratulations on quitting smoking!  Continue to wear your nicotine patch daily and you can use 1 piece of nicotine gum every 1-2 hours.  5. Follow up with Pharmacy Clinic in 1 week.   Patient advised to contact clinic or seek medical attention if signs/symptoms of bleeding or thromboembolism occur.  Patient verbalized understanding by repeating back information and was advised to contact me if further medication-related questions arise. Patient was also provided an information handout.  Follow-up Return in about 1 week (around 04/21/2018).  Gwenlyn Found, [place] D PGY1 Pharmacy Resident  Phone [phone] 04/14/2018   1:59 PM  30 minutes spent face-to-face with the patient during the encounter. 50% of time spent on education. 50% of time was spent on congratulating patient on attempt to quit smoking and conducting and interpreting INR results.  Patient was seen in clinic with Gwenlyn Found, PharmD, PGY1 pharmacy resident. I agree with the assessment and plan of care documented.

## 2018-04-14 NOTE — Progress Notes (Signed)
INTERNAL MEDICINE TEACHING ATTENDING ADDENDUM - Aldine Contes M.D  Duration- indefinite, Indication- PE, mechanical mitral valve, INR- sub therapeutic. Agree with pharmacy recommendations as outlined in their note.

## 2018-04-15 LAB — LIPID PANEL
CHOL/HDL RATIO: 2.9 ratio (ref 0.0–4.4)
Cholesterol, Total: 177 mg/dL (ref 100–199)
HDL: 62 mg/dL (ref >39)
LDL Calculated: 96 mg/dL (ref 0–99)
TRIGLYCERIDES: 97 mg/dL (ref 0–149)
VLDL CHOLESTEROL CAL: 19 mg/dL (ref 5–40)

## 2018-04-21 ENCOUNTER — Ambulatory Visit (INDEPENDENT_AMBULATORY_CARE_PROVIDER_SITE_OTHER): Payer: Medicare Other | Admitting: Internal Medicine

## 2018-04-21 ENCOUNTER — Encounter: Payer: Self-pay | Admitting: Internal Medicine

## 2018-04-21 ENCOUNTER — Other Ambulatory Visit: Payer: Self-pay

## 2018-04-21 ENCOUNTER — Ambulatory Visit: Payer: Medicare Other | Admitting: Pharmacist

## 2018-04-21 ENCOUNTER — Telehealth: Payer: Self-pay | Admitting: *Deleted

## 2018-04-21 ENCOUNTER — Other Ambulatory Visit (HOSPITAL_COMMUNITY)
Admission: RE | Admit: 2018-04-21 | Discharge: 2018-04-21 | Disposition: A | Discharge status: Home / Self Care | Payer: Medicare Other | Source: Ambulatory Visit | Attending: Internal Medicine | Admitting: Internal Medicine

## 2018-04-21 ENCOUNTER — Ambulatory Visit (INDEPENDENT_AMBULATORY_CARE_PROVIDER_SITE_OTHER): Payer: Medicare Other | Admitting: Pharmacist

## 2018-04-21 VITALS — BP 128/98 | HR 90 | Temp 98.1°F | Ht 65.0 in | Wt 159.2 lb

## 2018-04-21 DIAGNOSIS — N950 Postmenopausal bleeding: Secondary | ICD-10-CM

## 2018-04-21 DIAGNOSIS — Z9221 Personal history of antineoplastic chemotherapy: Secondary | ICD-10-CM | POA: Diagnosis not present

## 2018-04-21 DIAGNOSIS — Z853 Personal history of malignant neoplasm of breast: Secondary | ICD-10-CM

## 2018-04-21 DIAGNOSIS — Z7901 Long term (current) use of anticoagulants: Secondary | ICD-10-CM

## 2018-04-21 DIAGNOSIS — I2699 Other pulmonary embolism without acute cor pulmonale: Secondary | ICD-10-CM

## 2018-04-21 DIAGNOSIS — Z952 Presence of prosthetic heart valve: Secondary | ICD-10-CM | POA: Diagnosis not present

## 2018-04-21 DIAGNOSIS — F17210 Nicotine dependence, cigarettes, uncomplicated: Secondary | ICD-10-CM | POA: Diagnosis not present

## 2018-04-21 DIAGNOSIS — R102 Pelvic and perineal pain: Secondary | ICD-10-CM | POA: Diagnosis not present

## 2018-04-21 DIAGNOSIS — Z923 Personal history of irradiation: Secondary | ICD-10-CM | POA: Diagnosis not present

## 2018-04-21 DIAGNOSIS — R791 Abnormal coagulation profile: Secondary | ICD-10-CM

## 2018-04-21 DIAGNOSIS — Z8619 Personal history of other infectious and parasitic diseases: Secondary | ICD-10-CM

## 2018-04-21 DIAGNOSIS — Z86711 Personal history of pulmonary embolism: Secondary | ICD-10-CM | POA: Diagnosis not present

## 2018-04-21 DIAGNOSIS — R1032 Left lower quadrant pain: Secondary | ICD-10-CM

## 2018-04-21 DIAGNOSIS — R1031 Right lower quadrant pain: Secondary | ICD-10-CM

## 2018-04-21 HISTORY — DX: Postmenopausal bleeding: N95.0

## 2018-04-21 LAB — POCT INR: INR: 1.8 — AB (ref 2.0–3.0)

## 2018-04-21 NOTE — Progress Notes (Signed)
Anticoagulation Management Kim Anthony is a 67 y.o. female who reports to the clinic for monitoring of warfarin treatment.    Indication:history of PE, DVT, and mechanical mitral valve Duration:indefinite Supervising physician: Joni Reining  Anticoagulation Clinic Visit History: Patient does report signs/symptoms of vaginal bleeding/spotting but no signs/symptoms of thromboembolism.  Denies changes in diet, medications, and lifestyle Current warfarin dose: 5mg  daily except 7.5mg  on Tuesday and Thursdays.   Anticoagulation Episode Summary    Current INR goal:   2.5-3.5  TTR:   31.4 % (2.7 y)  Next INR check:   04/21/2018  INR from last check:   1.9! (04/14/2018)  Weekly max warfarin dose:     Target end date:     INR check location:   Anticoagulation Clinic  Preferred lab:     Send INR reminders to:      Indications   Pulmonary embolism (HCC) [I26.99]       Comments:           Allergies  Allergen Reactions  . Acetaminophen-Codeine Itching  . Other Itching    Darvocet  . Tramadol Itching    vomitting   Prior to Admission medications   Medication Sig Start Date End Date Taking? Authorizing Provider  albuterol (PROVENTIL HFA;VENTOLIN HFA) 108 (90 Base) MCG/ACT inhaler Inhale 1-2 puffs into the lungs every 6 (six) hours as needed for wheezing or shortness of breath. 12/07/17   Lenore Cordia, MD  albuterol (PROVENTIL) (2.5 MG/3ML) 0.083% nebulizer solution Take 3 mLs (2.5 mg total) by nebulization every 6 (six) hours as needed for wheezing or shortness of breath. 11/26/17   Katherine Roan, MD  Baclofen 5 MG TABS Take 5 mg by mouth every 8 (eight) hours as needed. Patient taking differently: Take 5 mg by mouth every 8 (eight) hours as needed (for spasms).  03/24/18   Isabelle Course, MD  calcium citrate-vitamin D (CITRACAL+D) 315-200 MG-UNIT tablet Take 2 tablets by mouth 2 (two) times daily. 01/07/18 07/06/18  Katherine Roan, MD  cetirizine (ZYRTEC) 10 MG tablet Take 1  tablet (10 mg total) by mouth daily. 05/27/17   Jule Ser, DO  diclofenac sodium (VOLTAREN) 1 % GEL Apply 2 g topically 3 (three) times daily as needed. Patient not taking: Reported on 04/11/2018 02/25/17   Alphonzo Grieve, MD  fluticasone (FLONASE) 50 MCG/ACT nasal spray Place 2 sprays into both nostrils daily. 12/03/16 04/11/26  Jule Ser, DO  furosemide (LASIX) 40 MG tablet Take 1 tablet (40 mg total) by mouth daily. 04/06/18   Isabelle Course, MD  gabapentin (NEURONTIN) 600 MG tablet Take 600 mg by mouth 3 (three) times daily. 09/23/17   [provider]  lisinopril (PRINIVIL,ZESTRIL) 10 MG tablet TAKE 1 TABLET BY MOUTH ONCE DAILY Patient taking differently: Take 10 mg by mouth daily.  03/14/18   Oda Kilts, MD  metoprolol succinate (TOPROL-XL) 25 MG 24 hr tablet TAKE 1/2 (ONE-HALF) TABLET BY MOUTH ONCE DAILY Patient taking differently: Take 12.5 mg by mouth daily.  01/29/18   Isabelle Course, MD  nicotine (NICODERM CQ - DOSED IN MG/24 HOURS) 14 mg/24hr patch Place 1 patch (14 mg total) onto the skin daily. 03/24/18 05/05/18  Isabelle Course, MD  potassium chloride 20 MEQ/15ML (10%) SOLN Take 30 mLs (40 mEq total) by mouth daily. 12/08/17   Lenore Cordia, MD  simvastatin (ZOCOR) 20 MG tablet TAKE 1 TABLET BY MOUTH ONCE DAILY Patient taking differently: Take 20 mg by mouth daily at  6 PM.  03/14/18   Oda Kilts, MD  tiZANidine (ZANAFLEX) 4 MG tablet Take 4 mg by mouth 3 (three) times daily. 09/23/17   [provider]  umeclidinium-vilanterol (ANORO ELLIPTA) 62.5-25 MCG/INH AEPB Inhale 1 puff into the lungs daily. 11/26/17   Katherine Roan, MD  warfarin (COUMADIN) 5 MG tablet Take 1 tablet (5 mg total) by mouth daily. Except take 1/2 tablets (2.5 mg) Tuesdays and Thursdays Patient taking differently: Take 5 mg by mouth daily.  03/24/18   Axel Filler, MD   Past Medical History:  Diagnosis Date  . Asthma   . Breast cancer ([place])    Left Breast Cancer   . CHF (congestive heart failure) ([place])   . COPD (chronic obstructive pulmonary disease) ([place])   . Coronary artery disease   . History of mitral valve replacement with mechanical valve   . HLD (hyperlipidemia)   . Hypertension   . Pulmonary embolism [facility])    Social History   Socioeconomic History  . Marital status: Single    Spouse name: Not on file  . Number of children: Not on file  . Years of education: Not on file  . Highest education level: Not on file  Occupational History  . Not on file  Social Needs  . Financial resource strain: Not on file  . Food insecurity:    Worry: Not on file    Inability: Not on file  . Transportation needs:    Medical: Not on file    Non-medical: Not on file  Tobacco Use  . Smoking status: Current Every Day Smoker    Packs/day: 0.50    Types: Cigarettes  . Smokeless tobacco: Never Used  . Tobacco comment: Sometimes less.  Substance and Sexual Activity  . Alcohol use: Yes    Comment: beer  . Drug use: No  . Sexual activity: Not on file  Lifestyle  . Physical activity:    Days per week: Not on file    Minutes per session: Not on file  . Stress: Not on file  Relationships  . Social connections:    Talks on phone: Not on file    Gets together: Not on file    Attends religious service: Not on file    Active member of club or organization: Not on file    Attends meetings of clubs or organizations: Not on file    Relationship status: Not on file  Other Topics Concern  . Not on file  Social History Narrative   ** Merged History Encounter **       Family History  Problem Relation Age of Onset  . Heart attack Unknown   . Breast cancer Maternal Aunt 50    ASSESSMENT Recent Results: The most recent result is correlated with 40 mg per week: Lab Results  Component Value Date   INR 1.9 (A) 04/14/2018   INR 1.97 04/11/2018   INR 2.4 04/07/2018    Anticoagulation Dosing:   INR today: Subtherapeutic at 1.8 (goal is  2.5-3.5)  PLAN Weekly dose was increased by 12.5% to 45 mg per week.  1. Take warfarin 5mg  daily except take 7.5mg  on Sunday, Tuesday, Thursday and Saturday.  2. Continue using Lovenox daily 3. Report any symptoms/signs of bleeding or clotting to your doctor   Patient was seen through a co-visit with Dr. Donne Hazel. Please see Dr. Cristal Generous note for further documentation.   There are no Patient Instructions on file for this visit. Patient advised  to contact clinic or seek medical attention if signs/symptoms of bleeding or thromboembolism occur.  Patient verbalized understanding by repeating back information and was advised to contact me if further medication-related questions arise. Patient was also provided an information handout.  Follow-up Follow up in 1 week   30 minutes spent face-to-face with the patient during the encounter. 50% of time spent on education. 50% of time was spent on reviewing patient history for explanation of bleeding symptoms.  Gwenlyn Found, Sherian Rein D PGY1 Pharmacy Resident  Phone [phone] 04/21/2018   3:45 PM

## 2018-04-21 NOTE — Assessment & Plan Note (Signed)
INR today is subtherapeutic at 1.8.  She reports compliance with her warfarin dosing.  Her vaginal bleeding today is not significant.  She is taking Lovenox at home.  She has a mechanical mitral valve and history of PE.  She denies current chest pain or shortness of breath.  Started her to increase warfarin to 7-1/2 mg 4 days a week and 5 mg 3 days a week.  She was given a printed out weekly dosing schedule.  Instructed to follow-up in 1 week for repeat INR.

## 2018-04-21 NOTE — Assessment & Plan Note (Signed)
2 weeks of vaginal spotting and 5 days of pelvic pain.  Last menstrual period was at age 67.  Per chart review she had an abnormal Pap smear in 2009 with HPV present.  She does have a history of breast cancer.  No family history uterine cancer.  She denies night sweats, weight loss.  She does endorse decreased appetite.  [place] includes fibroids, benign postmenopausal bleeding, cervical cancer, endometrial cancer.  Pelvic exam performed today with no palpable masses.  Bloody discharge from the cervical loss and area of abnormal cervical tissue in the upper right quadrant around the 1 o'clock position.  Pap smear performed.  We will also check CBC today given her fatigue in the setting of vaginal bleeding.  Referral placed to gynecology.

## 2018-04-21 NOTE — Patient Instructions (Signed)
1. Take warfarin 5mg  daily except take 7.5mg  on Sunday, Tuesday, Thursday and Saturday.  2. Continue using Lovenox daily 3. Report any symptoms/signs of bleeding or clotting to your doctor

## 2018-04-21 NOTE — Telephone Encounter (Signed)
SPOKE WITH PATIENT GAVE HER THE GYN APPOINTMENT WITH [facility] NOV. 20 @ 3:15PM. IF THE PAIN , ETC. GETS TOO BAD FOR HER, SHE IS TO [facility] TO BE SEEN.

## 2018-04-21 NOTE — Patient Instructions (Addendum)
It was nice seeing you today. Thank you for choosing [facility] for your Primary Care.   Today we talked about:  1) Vaginal bleeding/pain: We did a swab of your cervix today. I will call you with his results.  I also placed a referral to the gynecologist.  Please go to that appointment so they can evaluate this issue and order the appropriate ultrasounds.  For your pain please take Tylenol, you can take up to 1000 mg 3 times a day.  Take this medicine around the clock for a few days and if you are still experiencing significant pain to call the clinic.  2) INR: Please increase warfarin to 7.5mg  on Thursday, Saturday, Sunday, Tuesday and 5mg  Monday, Wednesday, Friday. Continue taking Lovenox. Come back in 1 week for INR check   FOLLOW-UP INSTRUCTIONS When: 1 week For: INR check  What to bring:   Please contact the clinic if you have any problems, or need to be seen sooner.

## 2018-04-21 NOTE — Progress Notes (Signed)
   CC: Vaginal bleeding  HPI:  [name] is a 67 y.o. female with h/o breast cancer status post surgery, chemo, radiation who presents for 2 weeks of vaginal spotting and 5 days of pelvic pain. Her LMP was at age 35.  She has had to wear a panty liner.  She denies passing blood clots.  She denies other discharge or dysuria.  She describes her pain as cramping and located in her lower abdomen.  She does not have a history of fibroids.  She does not remember when her last Pap smear was.  She denies weight loss, night sweats, melena, hematochezia, hematuria  She does endorse decreased appetite.    Past Medical History:  Diagnosis Date  . Asthma   . Breast cancer ([place])    Left Breast Cancer  . CHF (congestive heart failure) ([place])   . COPD (chronic obstructive pulmonary disease) ([place])   . Coronary artery disease   . History of mitral valve replacement with mechanical valve   . HLD (hyperlipidemia)   . Hypertension   . Pulmonary embolism (HCC)     Physical Exam:  Vitals:   04/21/18 1526  BP: (!) 128/98  Pulse: 90  Temp: 98.1 F (36.7 C)  TempSrc: Oral  SpO2: 97%  Weight: 159 lb 3.2 oz (72.2 kg)  Height: 5\' 5"  (1.651 m)   Gen: Well appearing, NAD CV: RRR, no murmurs Pulm: Normal effort, CTA throughout, no wheezing Abd: Soft, ND, to palpation in the right and left lower quadrants and suprapubic region, no palpable masses  GU: Small amount of bloody discharge from the cervical oss.  Area of abnormal cervical tissue located in the top right quadrant, around the 1 o'clock position.  No cervical motion tenderness.  No foul smell.  No palpable fibroids or enlarged uterus.   Assessment & Plan:   See Encounters Tab for problem based charting.  Patient seen with Dr. Evette Doffing

## 2018-04-22 LAB — CBC WITH DIFFERENTIAL/PLATELET
BASOS ABS: 0.1 10*3/uL (ref 0.0–0.2)
BASOS: 1 %
EOS (ABSOLUTE): 0.2 10*3/uL (ref 0.0–0.4)
EOS: 2 %
HEMATOCRIT: 36 % (ref 34.0–46.6)
HEMOGLOBIN: 12.5 g/dL (ref 11.1–15.9)
Immature Grans (Abs): 0 10*3/uL (ref 0.0–0.1)
Immature Granulocytes: 0 %
LYMPHS ABS: 3 10*3/uL (ref 0.7–3.1)
Lymphs: 29 %
MCH: 32.7 pg (ref 26.6–33.0)
MCHC: 34.7 g/dL (ref 31.5–35.7)
MCV: 94 fL (ref 79–97)
Monocytes Absolute: 1 10*3/uL — ABNORMAL HIGH (ref 0.1–0.9)
Monocytes: 10 %
NEUTROS ABS: 6.2 10*3/uL (ref 1.4–7.0)
Neutrophils: 58 %
Platelets: 403 10*3/uL (ref 150–450)
RBC: 3.82 x10E6/uL (ref 3.77–5.28)
RDW: 11.4 % — ABNORMAL LOW (ref 12.3–15.4)
WBC: 10.5 10*3/uL (ref 3.4–10.8)

## 2018-04-22 NOTE — Progress Notes (Signed)
Patient was seen in clinic with Kim Anthony, PharmD, PGY1 pharmacy resident. I agree with the assessment and plan of care documented. 

## 2018-04-22 NOTE — Progress Notes (Signed)
Internal Medicine Clinic Attending  I saw and evaluated the patient.  I personally confirmed the key portions of the history and exam documented by Dr. Vogel and I reviewed pertinent patient test results.  The assessment, diagnosis, and plan were formulated together and I agree with the documentation in the resident's note.  

## 2018-04-25 ENCOUNTER — Encounter: Payer: Self-pay | Admitting: Internal Medicine

## 2018-04-25 LAB — CYTOLOGY - PAP
Diagnosis: NEGATIVE
HPV (WINDOPATH): NOT DETECTED

## 2018-04-26 ENCOUNTER — Other Ambulatory Visit: Payer: Self-pay | Admitting: Internal Medicine

## 2018-04-26 ENCOUNTER — Other Ambulatory Visit: Payer: Self-pay

## 2018-04-26 DIAGNOSIS — R5381 Other malaise: Secondary | ICD-10-CM

## 2018-04-27 ENCOUNTER — Telehealth: Payer: Self-pay | Admitting: *Deleted

## 2018-04-27 ENCOUNTER — Telehealth: Payer: Self-pay

## 2018-04-27 NOTE — Telephone Encounter (Signed)
Pt called, notified that her blood counts were normal and her pap smear was normal per Dr. Cristal Generous instructions.  Also reiterated to pt to keep her appt. with OBGYN for evaluation of her vaginal spotting, she verbalized understanding.  [place], RN

## 2018-04-27 NOTE — Telephone Encounter (Signed)
Opened in error, duplicate.  [place], RN

## 2018-04-27 NOTE — Telephone Encounter (Signed)
-----   Message from Isabelle Course, MD sent at 04/25/2018 12:45 PM EST ----- Patient did not answer when I called. Please call patient to let her know that her blood counts are normal and her pap smear was normal. She still needs to follow up with OBGYN for her vaginal spotting.

## 2018-04-28 ENCOUNTER — Encounter: Payer: Self-pay | Admitting: Internal Medicine

## 2018-04-28 ENCOUNTER — Ambulatory Visit (INDEPENDENT_AMBULATORY_CARE_PROVIDER_SITE_OTHER): Payer: Medicare Other | Admitting: Internal Medicine

## 2018-04-28 ENCOUNTER — Ambulatory Visit (INDEPENDENT_AMBULATORY_CARE_PROVIDER_SITE_OTHER): Payer: Medicare Other | Admitting: Pharmacist

## 2018-04-28 ENCOUNTER — Other Ambulatory Visit: Payer: Self-pay

## 2018-04-28 VITALS — BP 126/75 | HR 74 | Temp 98.8°F | Ht 65.0 in | Wt 165.8 lb

## 2018-04-28 DIAGNOSIS — Z853 Personal history of malignant neoplasm of breast: Secondary | ICD-10-CM

## 2018-04-28 DIAGNOSIS — Z5181 Encounter for therapeutic drug level monitoring: Secondary | ICD-10-CM | POA: Diagnosis not present

## 2018-04-28 DIAGNOSIS — Z7901 Long term (current) use of anticoagulants: Secondary | ICD-10-CM

## 2018-04-28 DIAGNOSIS — I2699 Other pulmonary embolism without acute cor pulmonale: Secondary | ICD-10-CM | POA: Diagnosis not present

## 2018-04-28 LAB — POCT INR: INR: 3.5 — AB (ref 2.0–3.0)

## 2018-04-28 NOTE — Progress Notes (Signed)
Medicine attending: I personally interviewed and briefly examined this patient on the day of the patient visit and reviewed pertinent clinical ,laboratorydata  with resident physician Dr. Francesco Runner and we discussed a management plan. 67 y/o 18 yrs S/P left mastectomy. Suspect locally advanced disease as she had a lymph node dissection & radiation therapy. Surgery done when she was living outside of [place]. She developed a breakdown of the skin 0.5 cm area of mastectomy scar near axilla; scab formed; now fell of; shallow ulcer. No local nodularity; skin indurated in left supraclavicular fossa but no palpable node. No axillary adenopathy. Impression:  1. delayed skin breakdown from chronic radiation dermatitis vs. Scar recurrence of breast cancer. We will get a CT chest to include lower neck. 2. Mechanical mitral valve on warfarin; for colonoscopy; will bridge w lovenox; instructions given to pt.

## 2018-04-28 NOTE — Assessment & Plan Note (Signed)
Subacute.  New.  3-week history of scab wound on the lateral chest wall towards the left axilla.  She denies any preceding trauma.  She reports that the scab has fallen off and she is experiencing clear drainage and tenderness around the wound.  She denies fevers, chills, malodorous discharge, weight loss. Exam reveals a 1 cm open wound with healthy granulomatous tissue at the base, located on her mastectomy scar in the left axillary region.  Slight hyperpigmentation is noted secondary to history of radiation.  No palpable masses in either breast/chest wall.  No palpable lymphadenopathy in either axilla or supraclavicular regions. Differential includes breast cancer recurrence versus chronic skin changes secondary to radiation.  In order to rule out breast cancer recurrence, we will need a chest and neck CT.

## 2018-04-28 NOTE — Assessment & Plan Note (Signed)
Subacute.  New.  3-week history of scab wound on the lateral chest wall towards the left axilla.  She denies any preceding trauma.  She reports that the scab has fallen off and she is experiencing clear drainage and tenderness around the wound.  She denies fevers, chills, malodorous discharge, weight loss. Exam reveals a 1 cm open wound with healthy granulomatous tissue at the base, located on her mastectomy scar in the left axillary region.  Slight hyperpigmentation is noted secondary to history of radiation.  No palpable masses in either breast/chest wall.  No palpable lymphadenopathy in either axilla or supraclavicular regions. Differential includes breast cancer recurrence versus chronic skin changes secondary to radiation.  She is up-to-date on right breast mammograms.  Last one was 5 months ago and showed evidence of likely benign fat necrosis. In order to rule out breast cancer recurrence, we will need a chest and neck CT.

## 2018-04-28 NOTE — Progress Notes (Signed)
   CC: Scab under left arm  HPI:  [name] is a 67 y.o. female with history of breast cancer status post mastectomy, chemo, radiation 18 years ago who presents with a 3-week history of a new scab/wound in her left axilla.  She reports clear drainage and pain.  No fevers, chills, foul odor.  She has never had any thing like this before.    Past Medical History:  Diagnosis Date  . Asthma   . Breast cancer ([place])    Left Breast Cancer  . CHF (congestive heart failure) ([place])   . COPD (chronic obstructive pulmonary disease) ([place])   . Coronary artery disease   . History of mitral valve replacement with mechanical valve   . HLD (hyperlipidemia)   . Hypertension   . Pulmonary embolism (HCC)     Physical Exam:  Vitals:   04/28/18 1359  BP: 126/75  Pulse: 74  Temp: 98.8 F (37.1 C)  TempSrc: Oral  SpO2: 96%  Weight: 165 lb 12.8 oz (75.2 kg)  Height: 5\' 5"  (1.651 m)   Gen: Well appearing, NAD Neck: No cervical or supraclavicular LAD or nodules.  Breast: Right breast without skin changes or palpable masses.  No palpable lymphadenopathy in the right axilla.  Left chest wall with mastectomy scar and a 1 cm wound along the mastectomy scar in her left axilla with small amount of clear drainage and tenderness to palpation.  Palpable lymphadenopathy in the left axilla. Pulm: Normal effort, CTA throughout, no wheezing Ext: Warm, no edema, normal joints    Assessment & Plan:   See Encounters Tab for problem based charting.  Patient seen with Dr. Beryle Beams

## 2018-04-28 NOTE — Assessment & Plan Note (Signed)
Colonoscopy scheduled December 17 and GI doctor refusing to do colonoscopy while patient is on warfarin.  She has a mechanical mitral valve and as well as hx of PE when off Coumadin. She is a high-risk patient having a procedure with low bleeding risk.   Therefore, pre-procedure I would recommend:  Warfarin: stop for 5 days before procedure.  Begin lovenox bridge 3 days before procedure; decrease to single 1 mg/kg dose 24 hours before procedure. (dose of lovenox 1.5 mg/kg/day except decrease to 1 mg/kg day before procedure.).  Post-procedure: Resume Warfarin: night of procedure.  Lovenox bridge: begin 24 hours after procedure if patient on warfarin; continue until warfarin therapeutic.  Patient has been given the above instructions and they have been faxed to [facility].  The patient will follow up with Dr. Maudie Mercury a few days prior to the colonoscopy to review medication changes.

## 2018-04-28 NOTE — Patient Instructions (Addendum)
It was nice seeing you today. Thank you for choosing [facility] for your Primary Care.   Today we talked about:  1) Scab under left arm: I will order a CT to further evaluate this.  Someone will contact you about scheduling.  2) please make an appointment with Dr. Maudie Mercury a couple days before your colonoscopy to review how to manage her warfarin and Lovenox    Therefore, pre-procedure I would recommend: Warfarin: stop for 5 days before procedure. Begin lovenox bridge 3 days before procedure; decrease to single 1 mg/kg dose 24 hours before procedure. (dose of lovenox 1.5 mg/kg/day except decrease to 1 mg/kg day before procedure.). Post-procedure: Resume Warfarin: night of procedure. Lovenox bridge: begin 24 hours after procedure if patient on warfarin; continue until warfarin therapeutic.   FOLLOW-UP INSTRUCTIONS When: As needed  Please contact the clinic if you have any problems, or need to be seen sooner.

## 2018-04-28 NOTE — Progress Notes (Signed)
Pre-Procedure Instructions  Colonoscopy is scheduled on June 07, 2018. She has a mechanical mitral valve and as well as hx of PE when off Coumadin. She is a high-risk patient having a procedure with low bleeding risk.   Therefore, pre-procedure I would recommend:  Warfarin: stop for 5 days before procedure.  Begin lovenox bridge 3 days before procedure; decrease to single 1 mg/kg dose 24 hours before procedure. (dose of lovenox 1.5 mg/kg/day except decrease to 1 mg/kg day before procedure.).  Post-procedure: Resume Warfarin: night of procedure.  Lovenox bridge: begin 24 hours after procedure if patient on warfarin; continue until warfarin therapeutic.  Advised patient to schedule appointments with Dr. Maudie Mercury before the colonoscopy to review medication changes.  Sincerely,  Francesco Runner, MD

## 2018-05-09 NOTE — Progress Notes (Signed)
Anticoagulation Management Kim Anthony is a 67 y.o. female who reports to the clinic for monitoring of warfarin treatment.    Indication:history of PE, DVT, and mechanical mitral valve Duration:indefinite Supervising physician: [place]  Anticoagulation Clinic Visit History: Patient does not report any current signs/symptoms of bleeding or thromboembolism.   Anticoagulation Episode Summary    Current INR goal:   2.5-3.5  TTR:   31.3 % (2.7 y)  Next INR check:   05/09/2018  INR from last check:   3.5 (04/28/2018)  Weekly max warfarin dose:     Target end date:     INR check location:   Anticoagulation Clinic  Preferred lab:     Send INR reminders to:      Indications   Pulmonary embolism (HCC) [I26.99]       Comments:          Allergies  Allergen Reactions  . Acetaminophen-Codeine Itching  . Other Itching    Darvocet  . Tramadol Itching    vomitting   Medication Sig  albuterol (PROVENTIL HFA;VENTOLIN HFA) 108 (90 Base) MCG/ACT inhaler Inhale 1-2 puffs into the lungs every 6 (six) hours as needed for wheezing or shortness of breath.  albuterol (PROVENTIL) (2.5 MG/3ML) 0.083% nebulizer solution Take 3 mLs (2.5 mg total) by nebulization every 6 (six) hours as needed for wheezing or shortness of breath.  Baclofen 5 MG TABS Take 5 mg by mouth every 8 (eight) hours as needed. Patient taking differently: Take 5 mg by mouth every 8 (eight) hours as needed (for spasms).   calcium citrate-vitamin D (CITRACAL+D) 315-200 MG-UNIT tablet Take 2 tablets by mouth 2 (two) times daily.  cetirizine (ZYRTEC) 10 MG tablet Take 1 tablet (10 mg total) by mouth daily.  diclofenac sodium (VOLTAREN) 1 % GEL Apply 2 g topically 3 (three) times daily as needed. Patient not taking: Reported on 04/11/2018  fluticasone (FLONASE) 50 MCG/ACT nasal spray Place 2 sprays into both nostrils daily.  furosemide (LASIX) 40 MG tablet Take 1 tablet (40 mg total) by mouth daily.  gabapentin  (NEURONTIN) 600 MG tablet Take 600 mg by mouth 3 (three) times daily.  lisinopril (PRINIVIL,ZESTRIL) 10 MG tablet TAKE 1 TABLET BY MOUTH ONCE DAILY Patient taking differently: Take 10 mg by mouth daily.   metoprolol succinate (TOPROL-XL) 25 MG 24 hr tablet TAKE 1/2 (ONE-HALF) TABLET BY MOUTH ONCE DAILY Patient taking differently: Take 12.5 mg by mouth daily.   potassium chloride 20 MEQ/15ML (10%) SOLN Take 30 mLs (40 mEq total) by mouth daily.  simvastatin (ZOCOR) 20 MG tablet TAKE 1 TABLET BY MOUTH ONCE DAILY Patient taking differently: Take 20 mg by mouth daily at 6 PM.   tiZANidine (ZANAFLEX) 4 MG tablet Take 4 mg by mouth 3 (three) times daily.  umeclidinium-vilanterol (ANORO ELLIPTA) 62.5-25 MCG/INH AEPB Inhale 1 puff into the lungs daily.  warfarin (COUMADIN) 5 MG tablet Take 1 tablet (5 mg total) by mouth daily. Except take 1/2 tablets (2.5 mg) Tuesdays and Thursdays Patient taking differently: Take 5 mg by mouth daily.    Past Medical History:  Diagnosis Date  . Asthma   . Breast cancer ([place])    Left Breast Cancer  . CHF (congestive heart failure) ([place])   . COPD (chronic obstructive pulmonary disease) ([place])   . Coronary artery disease   . History of mitral valve replacement with mechanical valve   . HLD (hyperlipidemia)   . Hypertension   . Pulmonary embolism [facility])    Social History  Socioeconomic History  . Marital status: Single    Spouse name: Not on file  . Number of children: Not on file  . Years of education: Not on file  . Highest education level: Not on file  Occupational History  . Not on file  Social Needs  . Financial resource strain: Not on file  . Food insecurity:    Worry: Not on file    Inability: Not on file  . Transportation needs:    Medical: Not on file    Non-medical: Not on file  Tobacco Use  . Smoking status: Current Every Day Smoker    Packs/day: 0.50    Types: Cigarettes  . Smokeless tobacco: Never Used  . Tobacco comment: Sometimes  less.  Substance and Sexual Activity  . Alcohol use: Yes    Comment: beer  . Drug use: No  . Sexual activity: Not on file  Lifestyle  . Physical activity:    Days per week: Not on file    Minutes per session: Not on file  . Stress: Not on file  Relationships  . Social connections:    Talks on phone: Not on file    Gets together: Not on file    Attends religious service: Not on file    Active member of club or organization: Not on file    Attends meetings of clubs or organizations: Not on file    Relationship status: Not on file  Other Topics Concern  . Not on file  Social History Narrative   ** Merged History Encounter **       Family History  Problem Relation Age of Onset  . Heart attack Unknown   . Breast cancer Maternal Aunt 50   ASSESSMENT Recent Results: Lab Results  Component Value Date   INR 3.5 (A) 04/28/2018   INR 1.8 (A) 04/21/2018   INR 1.9 (A) 04/14/2018    INR today: Therapeutic  PLAN Weekly dose was unchanged Su-7.5mg , M-5mg , T-7.5mg , W-5mg , Th-7.5mg , F-5mg , Sa-7.5mg   There are no Patient Instructions on file for this visit. Patient advised to contact clinic or seek medical attention if signs/symptoms of bleeding or thromboembolism occur.  Patient verbalized understanding by repeating back information and was advised to contact me if further medication-related questions arise. Patient was also provided an information handout.  Follow-up Return (on 05/09/2018).  [name]

## 2018-05-11 ENCOUNTER — Ambulatory Visit (INDEPENDENT_AMBULATORY_CARE_PROVIDER_SITE_OTHER): Payer: Medicare Other | Admitting: Obstetrics and Gynecology

## 2018-05-11 ENCOUNTER — Encounter: Payer: Self-pay | Admitting: Obstetrics and Gynecology

## 2018-05-11 VITALS — BP 141/79 | HR 89 | Wt 159.4 lb

## 2018-05-11 DIAGNOSIS — S41109A Unspecified open wound of unspecified upper arm, initial encounter: Secondary | ICD-10-CM | POA: Insufficient documentation

## 2018-05-11 DIAGNOSIS — N950 Postmenopausal bleeding: Secondary | ICD-10-CM | POA: Diagnosis not present

## 2018-05-11 DIAGNOSIS — S41102S Unspecified open wound of left upper arm, sequela: Secondary | ICD-10-CM

## 2018-05-11 MED ORDER — CEPHALEXIN 500 MG PO CAPS: 500.0000 mg | ORAL_CAPSULE | Freq: Three times a day (TID) | ORAL | 0 refills | Status: DC

## 2018-05-11 NOTE — Progress Notes (Signed)
Patient ID: Kim Anthony, female   DOB: [birth year], 67 y.o.   [name]: [id number] Ms [name] presents for eval for PMB. Referred by IM clinic.  Pt reports LMP @ age 60. Dx with breast cancer around that time and treated with left mastectomy, chemotherapy and radiation.   Pt reports vaginal bleeding the month of Oct. None since. Had negative pap smear in Oct. Not sexual active  Chronic medical problems and medications as listed. Of particular note pt on anticoagulation d/t mechanical heart valve and H/O PE.   Pt also reports separation of mastectomy scar in axillary area for over a month. Has seen IM about this and CT scan has been ordered. She reports some drainage at times.   PE AF VSS  Lungs clear Heart RRR Abd soft + BS Left axillary small separation of about 1 cm in scar line granulation tissue note no frank discharge or infection noted GU nl EGBUS vaginal mucosa atrophic, cervix without lesions no bleeding noted uterus small non tender no masses  A/P PMB        Axillary scar separation  PMB reviewed with pt. Will check GYN U/S. F/U in 3-4 weeks for review of results and possible EMBX. Will start Keflex for pt reassurance. Pt instructed on wound care and to continue with tests and f/u as per IM clinic.

## 2018-05-11 NOTE — Progress Notes (Signed)
Pt presents for PMB x 1 month. Bleeding subsided.  Hx of breast ca c/o drainage under L armpit.

## 2018-05-12 ENCOUNTER — Other Ambulatory Visit: Payer: Self-pay | Admitting: Internal Medicine

## 2018-05-12 ENCOUNTER — Ambulatory Visit (HOSPITAL_COMMUNITY): Payer: Medicare Other | Attending: Oncology

## 2018-05-12 DIAGNOSIS — G8929 Other chronic pain: Secondary | ICD-10-CM

## 2018-05-12 DIAGNOSIS — M545 Low back pain: Principal | ICD-10-CM

## 2018-05-16 ENCOUNTER — Ambulatory Visit: Payer: Medicare Other

## 2018-05-17 ENCOUNTER — Ambulatory Visit (INDEPENDENT_AMBULATORY_CARE_PROVIDER_SITE_OTHER): Payer: Medicare Other | Admitting: Pharmacist

## 2018-05-17 ENCOUNTER — Ambulatory Visit (HOSPITAL_COMMUNITY)
Admission: RE | Admit: 2018-05-17 | Discharge: 2018-05-17 | Disposition: A | Discharge status: Home / Self Care | Payer: Medicare Other | Source: Ambulatory Visit | Attending: Obstetrics and Gynecology | Admitting: Obstetrics and Gynecology

## 2018-05-17 DIAGNOSIS — I2699 Other pulmonary embolism without acute cor pulmonale: Secondary | ICD-10-CM

## 2018-05-17 DIAGNOSIS — Z5181 Encounter for therapeutic drug level monitoring: Secondary | ICD-10-CM

## 2018-05-17 DIAGNOSIS — D251 Intramural leiomyoma of uterus: Secondary | ICD-10-CM | POA: Diagnosis not present

## 2018-05-17 DIAGNOSIS — Z7901 Long term (current) use of anticoagulants: Secondary | ICD-10-CM | POA: Diagnosis not present

## 2018-05-17 DIAGNOSIS — N950 Postmenopausal bleeding: Secondary | ICD-10-CM | POA: Diagnosis not present

## 2018-05-17 LAB — POCT INR: INR: 2.9 (ref 2.0–3.0)

## 2018-05-17 NOTE — Patient Instructions (Signed)
Patient educated about medication as defined in this encounter and verbalized understanding by repeating back instructions provided.   

## 2018-05-17 NOTE — Progress Notes (Addendum)
Anticoagulation Management Kim Anthony a 67 y.o.femalewho reports to the clinic for monitoring of warfarintreatment.  Indication:history of PE, DVT, and mechanical mitral valve Duration:indefinite Supervising physician:Nischal Narendra  Anticoagulation Clinic Visit History: Patientdoes notreport any currentsigns/symptoms of bleeding or thromboembolism.   Anticoagulation Episode Summary    Current INR goal:   2.5-3.5  TTR:   32.6 % (2.8 y)  Next INR check:   06/02/2018  INR from last check:   2.9 (05/17/2018)  Weekly max warfarin dose:     Target end date:     INR check location:   Anticoagulation Clinic  Preferred lab:     Send INR reminders to:      Indications   Pulmonary embolism (HCC) [I26.99]       Comments:          Allergies  Allergen Reactions  . Acetaminophen-Codeine Itching  . Other Itching    Darvocet  . Tramadol Itching    vomitting   Medication Sig  albuterol (PROVENTIL HFA;VENTOLIN HFA) 108 (90 Base) MCG/ACT inhaler Inhale 1-2 puffs into the lungs every 6 (six) hours as needed for wheezing or shortness of breath.  albuterol (PROVENTIL) (2.5 MG/3ML) 0.083% nebulizer solution Take 3 mLs (2.5 mg total) by nebulization every 6 (six) hours as needed for wheezing or shortness of breath.  Baclofen 5 MG TABS TAKE 1 TABLET BY MOUTH EVERY 8 HOURS AS NEEDED  calcium citrate-vitamin D (CITRACAL+D) 315-200 MG-UNIT tablet Take 2 tablets by mouth 2 (two) times daily.  cephALEXin (KEFLEX) 500 MG capsule Take 1 capsule (500 mg total) by mouth 3 (three) times daily.  cetirizine (ZYRTEC) 10 MG tablet Take 1 tablet (10 mg total) by mouth daily.  diclofenac sodium (VOLTAREN) 1 % GEL Apply 2 g topically 3 (three) times daily as needed.  fluticasone (FLONASE) 50 MCG/ACT nasal spray Place 2 sprays into both nostrils daily.  furosemide (LASIX) 40 MG tablet Take 1 tablet (40 mg total) by mouth daily.  gabapentin (NEURONTIN) 600 MG tablet Take 600 mg by mouth 3  (three) times daily.  lisinopril (PRINIVIL,ZESTRIL) 10 MG tablet TAKE 1 TABLET BY MOUTH ONCE DAILY Patient taking differently: Take 10 mg by mouth daily.   metoprolol succinate (TOPROL-XL) 25 MG 24 hr tablet TAKE 1/2 (ONE-HALF) TABLET BY MOUTH ONCE DAILY Patient taking differently: Take 12.5 mg by mouth daily.   potassium chloride 20 MEQ/15ML (10%) SOLN Take 30 mLs (40 mEq total) by mouth daily.  simvastatin (ZOCOR) 20 MG tablet TAKE 1 TABLET BY MOUTH ONCE DAILY Patient taking differently: Take 20 mg by mouth daily at 6 PM.   tiZANidine (ZANAFLEX) 4 MG tablet Take 4 mg by mouth 3 (three) times daily.  umeclidinium-vilanterol (ANORO ELLIPTA) 62.5-25 MCG/INH AEPB Inhale 1 puff into the lungs daily.  warfarin (COUMADIN) 5 MG tablet Take 1 tablet (5 mg total) by mouth daily. Except take 1/2 tablets (2.5 mg) Tuesdays and Thursdays Patient taking differently: Take 5 mg by mouth daily.    Past Medical History:  Diagnosis Date  . Asthma   . Breast cancer ([place])    Left Breast Cancer  . CHF (congestive heart failure) ([place])   . COPD (chronic obstructive pulmonary disease) ([place])   . Coronary artery disease   . History of mitral valve replacement with mechanical valve   . HLD (hyperlipidemia)   . Hypertension   . Pulmonary embolism [facility])    Social History   Socioeconomic History  . Marital status: Single    Spouse name: Not on file  .  Number of children: Not on file  . Years of education: Not on file  . Highest education level: Not on file  Occupational History  . Not on file  Social Needs  . Financial resource strain: Not on file  . Food insecurity:    Worry: Not on file    Inability: Not on file  . Transportation needs:    Medical: Not on file    Non-medical: Not on file  Tobacco Use  . Smoking status: Current Every Day Smoker    Packs/day: 0.50    Types: Cigarettes  . Smokeless tobacco: Never Used  . Tobacco comment: Sometimes less.  Substance and Sexual Activity  . Alcohol  use: Yes    Comment: beer  . Drug use: No  . Sexual activity: Not Currently    Birth control/protection: None  Lifestyle  . Physical activity:    Days per week: Not on file    Minutes per session: Not on file  . Stress: Not on file  Relationships  . Social connections:    Talks on phone: Not on file    Gets together: Not on file    Attends religious service: Not on file    Active member of club or organization: Not on file    Attends meetings of clubs or organizations: Not on file    Relationship status: Not on file  Other Topics Concern  . Not on file  Social History Narrative   ** Merged History Encounter **       Family History  Problem Relation Age of Onset  . Heart attack Unknown   . Breast cancer Maternal Aunt 50   ASSESSMENT Lab Results  Component Value Date   INR 2.9 05/17/2018   INR 3.5 (A) 04/28/2018   INR 1.8 (A) 04/21/2018   INR today: Therapeutic  PLAN Weekly dose was unchanged  Patient Instructions  Patient educated about medication as defined in this encounter and verbalized understanding by repeating back instructions provided.   Patient advised to contact clinic or seek medical attention if signs/symptoms of bleeding or thromboembolism occur.  Patient verbalized understanding by repeating back information and was advised to contact me if further medication-related questions arise. Patient was also provided an information handout.  Follow-up 2 weeks---patient reports she has a colonoscopy scheduled for December 17th, will need help with enoxaparin bridging.  Kim Anthony  15 minutes spent face-to-face with the patient during the encounter. 50% of time spent on education. 50% of time was spent on assessment, plan, coordination of care.

## 2018-05-23 NOTE — Progress Notes (Signed)
INTERNAL MEDICINE TEACHING ATTENDING ADDENDUM - Lucious Groves, DO Duration- indefinate, Indication- mech MV, INR- therapeutic  Lab Results  Component Value Date   INR 2.9 05/17/2018  . Agree with pharmacy recommendations as outlined in their note.

## 2018-06-02 ENCOUNTER — Ambulatory Visit (INDEPENDENT_AMBULATORY_CARE_PROVIDER_SITE_OTHER): Payer: Medicare Other | Admitting: Pharmacist

## 2018-06-02 DIAGNOSIS — Z7901 Long term (current) use of anticoagulants: Secondary | ICD-10-CM

## 2018-06-02 DIAGNOSIS — Z86711 Personal history of pulmonary embolism: Secondary | ICD-10-CM | POA: Diagnosis not present

## 2018-06-02 DIAGNOSIS — Z5181 Encounter for therapeutic drug level monitoring: Secondary | ICD-10-CM | POA: Diagnosis not present

## 2018-06-02 DIAGNOSIS — I2699 Other pulmonary embolism without acute cor pulmonale: Secondary | ICD-10-CM

## 2018-06-02 LAB — POCT INR: INR: 2 (ref 2.0–3.0)

## 2018-06-03 ENCOUNTER — Other Ambulatory Visit (HOSPITAL_COMMUNITY)
Admission: RE | Admit: 2018-06-03 | Discharge: 2018-06-03 | Disposition: A | Discharge status: Home / Self Care | Payer: Medicare Other | Source: Ambulatory Visit | Attending: Obstetrics and Gynecology | Admitting: Obstetrics and Gynecology

## 2018-06-03 ENCOUNTER — Encounter: Payer: Self-pay | Admitting: Obstetrics and Gynecology

## 2018-06-03 ENCOUNTER — Ambulatory Visit (INDEPENDENT_AMBULATORY_CARE_PROVIDER_SITE_OTHER): Payer: Medicare Other | Admitting: Obstetrics and Gynecology

## 2018-06-03 VITALS — BP 125/73 | HR 86 | Wt 159.0 lb

## 2018-06-03 DIAGNOSIS — N950 Postmenopausal bleeding: Secondary | ICD-10-CM | POA: Diagnosis not present

## 2018-06-03 NOTE — Progress Notes (Signed)
Anticoagulation Management Kim Anthony a 67 y.o.femalewho reports to the clinic for monitoring of warfarintreatment.  Indication:history of PE, DVT, and mechanical mitral valve Duration:indefinite Supervising physician:Nischal Narendra  Anticoagulation Clinic Visit History: Patientdoes notreport any currentsigns/symptoms of bleeding or thromboembolism.  Anticoagulation Episode Summary    Current INR goal:   2.5-3.5  TTR:   32.8 % (2.8 y)  Next INR check:   06/10/2018  INR from last check:   2.0! (06/02/2018)  Weekly max warfarin dose:     Target end date:     INR check location:   Anticoagulation Clinic  Preferred lab:     Send INR reminders to:      Indications   Pulmonary embolism (HCC) [I26.99]       Comments:          Allergies  Allergen Reactions  . Acetaminophen-Codeine Itching  . Other Itching and Other (See Comments)    Darvocet  . Tramadol Itching and Nausea And Vomiting   Medication Sig  albuterol (PROVENTIL HFA;VENTOLIN HFA) 108 (90 Base) MCG/ACT inhaler Inhale 1-2 puffs into the lungs every 6 (six) hours as needed for wheezing or shortness of breath.  albuterol (PROVENTIL) (2.5 MG/3ML) 0.083% nebulizer solution Take 3 mLs (2.5 mg total) by nebulization every 6 (six) hours as needed for wheezing or shortness of breath.  Baclofen 5 MG TABS TAKE 1 TABLET BY MOUTH EVERY 8 HOURS AS NEEDED Patient not taking: Reported on 06/03/2018  Cholecalciferol (VITAMIN D3 PO) Take 1 capsule by mouth 2 (two) times daily.  fluticasone (FLONASE) 50 MCG/ACT nasal spray Place 2 sprays into both nostrils daily. Patient taking differently: Place 1 spray into both nostrils daily.   furosemide (LASIX) 40 MG tablet Take 1 tablet (40 mg total) by mouth daily.  gabapentin (NEURONTIN) 600 MG tablet Take 600 mg by mouth 3 (three) times daily.  lisinopril (PRINIVIL,ZESTRIL) 10 MG tablet TAKE 1 TABLET BY MOUTH ONCE DAILY Patient taking differently: Take 10 mg by mouth daily.    metoprolol succinate (TOPROL-XL) 25 MG 24 hr tablet TAKE 1/2 (ONE-HALF) TABLET BY MOUTH ONCE DAILY Patient taking differently: Take 12.5 mg by mouth daily.   potassium chloride SA (K-DUR,KLOR-CON) 20 MEQ tablet Take 20 mEq by mouth 2 (two) times daily.  simvastatin (ZOCOR) 20 MG tablet TAKE 1 TABLET BY MOUTH ONCE DAILY Patient taking differently: Take 20 mg by mouth daily at 6 PM.   umeclidinium-vilanterol (ANORO ELLIPTA) 62.5-25 MCG/INH AEPB Inhale 1 puff into the lungs daily.  warfarin (COUMADIN) 5 MG tablet Take 1 tablet (5 mg total) by mouth daily. Except take 1/2 tablets (2.5 mg) Tuesdays and Thursdays Patient taking differently: Take 5-7.5 mg by mouth See admin instructions. Take 7.5 mg by mouth daily on Tuesday and Thursday. Take 5 mg by mouth daily on all other days.   Past Medical History:  Diagnosis Date  . Asthma   . Breast cancer ([place])    Left Breast Cancer  . CHF (congestive heart failure) ([place])   . COPD (chronic obstructive pulmonary disease) ([place])   . Coronary artery disease   . History of mitral valve replacement with mechanical valve   . HLD (hyperlipidemia)   . Hypertension   . Pulmonary embolism [facility])    Social History   Socioeconomic History  . Marital status: Single    Spouse name: Not on file  . Number of children: Not on file  . Years of education: Not on file  . Highest education level: Not on file  Occupational  History  . Not on file  Social Needs  . Financial resource strain: Not on file  . Food insecurity:    Worry: Not on file    Inability: Not on file  . Transportation needs:    Medical: Not on file    Non-medical: Not on file  Tobacco Use  . Smoking status: Current Every Day Smoker    Packs/day: 0.50    Types: Cigarettes  . Smokeless tobacco: Never Used  . Tobacco comment: Sometimes less.  Substance and Sexual Activity  . Alcohol use: Yes    Comment: beer  . Drug use: No  . Sexual activity: Not Currently    Birth control/protection:  None  Lifestyle  . Physical activity:    Days per week: Not on file    Minutes per session: Not on file  . Stress: Not on file  Relationships  . Social connections:    Talks on phone: Not on file    Gets together: Not on file    Attends religious service: Not on file    Active member of club or organization: Not on file    Attends meetings of clubs or organizations: Not on file    Relationship status: Not on file  Other Topics Concern  . Not on file  Social History Narrative   ** Merged History Encounter **       Family History  Problem Relation Age of Onset  . Heart attack Other   . Breast cancer Maternal Aunt 50   ASSESSMENT Recent Results: Lab Results  Component Value Date   INR 2.0 06/02/2018   INR 2.9 05/17/2018   INR 3.5 (A) 04/28/2018    INR today: Subtherapeutic  PLAN Patient has a colonoscopy scheduled for the morning of 06/07/18. Enoxaparin bridging was initiated due to history of PE, DVT, and mechanical mitral valve. Since INR 2.0 today (goal 2.5 to 3.5) advised patient to go ahead and initiate bridge (hold warfarin) with enoxaparin 120 mg daily, last dose the morning prior to procedure (06/06/18), re-start enoxaparin and warfarin the evening of 06/07/18 unless otherwise advised by team.  There are no Patient Instructions on file for this visit. Patient advised to contact clinic or seek medical attention if signs/symptoms of bleeding or thromboembolism occur.  Patient verbalized understanding by repeating back information and was advised to contact me if further medication-related questions arise. Patient was also provided an information handout.  Follow-up Return in about 1 week (around 06/09/2018).  Kim Anthony  30 minutes spent face-to-face with the patient during the encounter. 50% of time spent on education. 50% of time was spent on assessment, plan, and coordination of care.

## 2018-06-03 NOTE — Progress Notes (Signed)
Pt presents for endometrial biospy d/t PMB.

## 2018-06-03 NOTE — Patient Instructions (Signed)

## 2018-06-06 ENCOUNTER — Other Ambulatory Visit: Payer: Self-pay | Admitting: Gastroenterology

## 2018-06-06 NOTE — Progress Notes (Signed)
Patient ID: Kim Anthony, female   DOB: 1950/09/30, 67 y.o.   [name]: [id number] ENDOMETRIAL BIOPSY     The indications for endometrial biopsy were reviewed.   Risks of the biopsy including cramping, bleeding, infection, uterine perforation, inadequate specimen and need for additional procedures  were discussed. The patient states she understands and agrees to undergo procedure today. Consent was signed. Time out was performed. Urine HCG was negative. During the pelvic exam, the cervix was prepped with Betadine. A single-toothed tenaculum was placed on the anterior lip of the cervix to stabilize it. The 3 mm pipelle was introduced into the endometrial cavity without difficulty to a depth of 7 cm, and a moderate amount of tissue was obtained and sent to pathology. The instruments were removed from the patient's vagina. Minimal bleeding from the cervix was noted. The patient tolerated the procedure well. Routine post-procedure instructions were given to the patient.

## 2018-06-07 ENCOUNTER — Encounter (HOSPITAL_COMMUNITY): Payer: Self-pay | Admitting: Emergency Medicine

## 2018-06-07 ENCOUNTER — Other Ambulatory Visit: Payer: Self-pay

## 2018-06-07 ENCOUNTER — Encounter (HOSPITAL_COMMUNITY)
Admission: RE | Disposition: A | Discharge status: Home / Self Care | Payer: Self-pay | Source: Home / Self Care | Attending: Gastroenterology

## 2018-06-07 ENCOUNTER — Ambulatory Visit (HOSPITAL_COMMUNITY)
Admission: RE | Admit: 2018-06-07 | Discharge: 2018-06-07 | Disposition: A | Discharge status: Home / Self Care | Payer: Medicare Other | Attending: Gastroenterology | Admitting: Gastroenterology

## 2018-06-07 ENCOUNTER — Ambulatory Visit (HOSPITAL_COMMUNITY): Payer: Medicare Other | Admitting: Certified Registered Nurse Anesthetist

## 2018-06-07 DIAGNOSIS — I110 Hypertensive heart disease with heart failure: Secondary | ICD-10-CM | POA: Diagnosis not present

## 2018-06-07 DIAGNOSIS — Z7901 Long term (current) use of anticoagulants: Secondary | ICD-10-CM | POA: Insufficient documentation

## 2018-06-07 DIAGNOSIS — Z79899 Other long term (current) drug therapy: Secondary | ICD-10-CM | POA: Diagnosis not present

## 2018-06-07 DIAGNOSIS — F17200 Nicotine dependence, unspecified, uncomplicated: Secondary | ICD-10-CM | POA: Diagnosis not present

## 2018-06-07 DIAGNOSIS — I509 Heart failure, unspecified: Secondary | ICD-10-CM | POA: Diagnosis not present

## 2018-06-07 DIAGNOSIS — D122 Benign neoplasm of ascending colon: Secondary | ICD-10-CM | POA: Insufficient documentation

## 2018-06-07 DIAGNOSIS — K640 First degree hemorrhoids: Secondary | ICD-10-CM | POA: Insufficient documentation

## 2018-06-07 DIAGNOSIS — J45909 Unspecified asthma, uncomplicated: Secondary | ICD-10-CM | POA: Insufficient documentation

## 2018-06-07 DIAGNOSIS — I2510 Atherosclerotic heart disease of native coronary artery without angina pectoris: Secondary | ICD-10-CM | POA: Diagnosis not present

## 2018-06-07 DIAGNOSIS — Z1211 Encounter for screening for malignant neoplasm of colon: Secondary | ICD-10-CM | POA: Diagnosis not present

## 2018-06-07 DIAGNOSIS — J449 Chronic obstructive pulmonary disease, unspecified: Secondary | ICD-10-CM | POA: Insufficient documentation

## 2018-06-07 HISTORY — PX: COLONOSCOPY WITH PROPOFOL: SHX5780

## 2018-06-07 HISTORY — PX: POLYPECTOMY: SHX5525

## 2018-06-07 SURGERY — COLONOSCOPY WITH PROPOFOL
Anesthesia: Monitor Anesthesia Care

## 2018-06-07 MED ORDER — SODIUM CHLORIDE 0.9 % IV SOLN: INTRAVENOUS | Status: DC

## 2018-06-07 MED ORDER — PROPOFOL 500 MG/50ML IV EMUL
INTRAVENOUS | Status: DC | PRN
  Administered 2018-06-07: 150 ug/kg/min via INTRAVENOUS

## 2018-06-07 MED ORDER — ONDANSETRON HCL 4 MG/2ML IJ SOLN
INTRAMUSCULAR | Status: DC | PRN
  Administered 2018-06-07: 4 mg via INTRAVENOUS

## 2018-06-07 MED ORDER — PROPOFOL 10 MG/ML IV BOLUS
INTRAVENOUS | Status: DC | PRN
  Administered 2018-06-07 (×2): 10 mg via INTRAVENOUS
  Administered 2018-06-07: 40 mg via INTRAVENOUS
  Administered 2018-06-07 (×2): 20 mg via INTRAVENOUS

## 2018-06-07 MED ORDER — PROPOFOL 10 MG/ML IV BOLUS
INTRAVENOUS | Status: AC
  Filled 2018-06-07: qty 40

## 2018-06-07 MED ORDER — LACTATED RINGERS IV SOLN: INTRAVENOUS | Status: DC

## 2018-06-07 MED ORDER — LACTATED RINGERS IV SOLN
INTRAVENOUS | Status: DC
  Administered 2018-06-07: 08:00:00 via INTRAVENOUS

## 2018-06-07 MED ORDER — PROPOFOL 10 MG/ML IV BOLUS
INTRAVENOUS | Status: AC
  Filled 2018-06-07: qty 20

## 2018-06-07 SURGICAL SUPPLY — 21 items

## 2018-06-07 NOTE — Anesthesia Preprocedure Evaluation (Signed)
Anesthesia Evaluation  Patient identified by [name], date of birth, ID band Patient awake    Reviewed: Allergy & Precautions, H&P , NPO status , Patient's Chart, lab work & pertinent test results  Airway Mallampati: II   Neck ROM: full    Dental   Pulmonary asthma , COPD, Current Smoker,    breath sounds clear to auscultation       Cardiovascular hypertension, + CAD and +CHF  + Valvular Problems/Murmurs  Rhythm:regular Rate:Normal  H/o MVR   Neuro/Psych  Headaches,    GI/Hepatic   Endo/Other    Renal/GU      Musculoskeletal   Abdominal   Peds  Hematology   Anesthesia Other Findings   Reproductive/Obstetrics                             Anesthesia Physical Anesthesia Plan  ASA: III  Anesthesia Plan: General   Post-op Pain Management:    Induction: Intravenous  PONV Risk Score and Plan: 2 and Propofol infusion, Ondansetron and Treatment may vary due to age or medical condition  Airway Management Planned: Simple Face Mask  Additional Equipment:   Intra-op Plan:   Post-operative Plan:   Informed Consent: I have reviewed the patients History and Physical, chart, labs and discussed the procedure including the risks, benefits and alternatives for the proposed anesthesia with the patient or authorized representative who has indicated his/her understanding and acceptance.     Plan Discussed with: CRNA, Anesthesiologist and Surgeon  Anesthesia Plan Comments:         Anesthesia Quick Evaluation

## 2018-06-07 NOTE — H&P (Signed)
Date of Initial H&P: 06/06/18  History reviewed, patient examined, no change in status, stable for surgery.  

## 2018-06-07 NOTE — Interval H&P Note (Signed)
History and Physical Interval Note:  06/07/2018 9:13 AM  Kim Anthony  has presented today for surgery, with the diagnosis of Screening  The various methods of treatment have been discussed with the patient and family. After consideration of risks, benefits and other options for treatment, the patient has consented to  Procedure(s): COLONOSCOPY WITH PROPOFOL (N/A) as a surgical intervention .  The patient's history has been reviewed, patient examined, no change in status, stable for surgery.  I have reviewed the patient's chart and labs.  Questions were answered to the patient's satisfaction.     Lear Ng

## 2018-06-07 NOTE — Discharge Instructions (Signed)
HOLD WARFARIN (COUMADIN) until Friday December 20. Continue Lovenox injections as previously directed. Call Dr. Zenovia Jarred office on Thursday December 19 to find out when you need an INR level checked. Avoid all aspirin products for the next 2 weeks.

## 2018-06-07 NOTE — Transfer of Care (Signed)
Immediate Anesthesia Transfer of Care Note  Patient: Kim Anthony  Procedure(s) Performed: COLONOSCOPY WITH PROPOFOL (N/A )  Patient Location: PACU  Anesthesia Type:MAC  Level of Consciousness: awake, alert  and oriented  Airway & Oxygen Therapy: Patient Spontanous Breathing and Patient connected to face mask oxygen  Post-op Assessment: Report given to RN and Post -op Vital signs reviewed and stable  Post vital signs: Reviewed and stable  Last Vitals:  Vitals Value Taken Time  BP    Temp    Pulse    Resp 16 06/07/2018 10:03 AM  SpO2    Vitals shown include unvalidated device data.  Last Pain:  Vitals:   06/07/18 0753  TempSrc: Oral  PainSc: 0-No pain         Complications: No apparent anesthesia complications

## 2018-06-07 NOTE — Op Note (Signed)
[facility] Patient Name: Kim Anthony Procedure Date: 06/07/2018 [name]: [id number] Attending MD: [name] , MD Date of Birth: [birth year] CSN: [id number] Age: 67 Admit Type: Outpatient Procedure:                Colonoscopy Indications:              Screening for colorectal malignant neoplasm, This                            is the patient's first colonoscopy Providers:                [name], MD, Norman Clay, RN, Arlee Muslim Tech., Technician, Maricela Curet, CRNA Referring MD:             Burns Spain MD Medicines:                Propofol per Anesthesia, Monitored Anesthesia Care Complications:            No immediate complications. Estimated Blood Loss:     Estimated blood loss: none. Procedure:                Pre-Anesthesia Assessment:                           - Prior to the procedure, a History and Physical                            was performed, and patient medications and                            allergies were reviewed. The patient's tolerance of                            previous anesthesia was also reviewed. The risks                            and benefits of the procedure and the sedation                            options and risks were discussed with the patient.                            All questions were answered, and informed consent                            was obtained. Prior Anticoagulants: The patient has                            taken Coumadin (warfarin), last dose was 5 days                            prior to procedure. ASA Grade Assessment: III - A  patient with severe systemic disease. After                            reviewing the risks and benefits, the patient was                            deemed in satisfactory condition to undergo the                            procedure.                           After obtaining informed consent, the colonoscope                            was passed under direct vision. Throughout the                            procedure, the patient's blood pressure, pulse, and                            oxygen saturations were monitored continuously. The                            PCF-H190DL ([id number] Olympus peds colonscope was                            introduced through the anus and advanced to the the                            cecum, identified by appendiceal orifice and                            ileocecal valve. The colonoscopy was performed with                            difficulty due to significant looping, a tortuous                            colon, ineffective sedation and fair prep.                            Successful completion of the procedure was aided by                            increasing the dose of sedation medication,                            straightening and shortening the scope to obtain                            bowel loop reduction, using scope torsion, applying  abdominal pressure and lavage. The patient                            tolerated the procedure. The quality of the bowel                            preparation was fair. The ileocecal valve,                            appendiceal orifice, and rectum were photographed. Scope In: 9:30:56 AM Scope Out: 9:55:00 AM Scope Withdrawal Time: 0 hours 14 minutes 5 seconds  Total Procedure Duration: 0 hours 24 minutes 4 seconds  Findings:      The perianal and digital rectal examinations were normal.      A 20 mm polyp was found in the proximal ascending colon. The polyp was       semi-pedunculated. The polyp was removed with a hot snare. Resection and       retrieval were complete. Estimated blood loss: none.      Internal hemorrhoids were found during retroflexion. The hemorrhoids       were small and Grade I (internal hemorrhoids that do not prolapse). Impression:               - Preparation of the  colon was fair.                           - One 20 mm polyp in the proximal ascending colon,                            removed with a hot snare. Resected and retrieved.                           - Internal hemorrhoids. Moderate Sedation:      Not Applicable - Patient had care per Anesthesia. Recommendation:           - Await pathology results.                           - Repeat colonoscopy for surveillance based on                            pathology results.                           - Patient has a contact number available for                            emergencies. The signs and symptoms of potential                            delayed complications were discussed with the                            patient. Return to normal activities tomorrow.  Written discharge instructions were provided to the                            patient.                           - High fiber diet.                           - No aspirin, ibuprofen, naproxen, or other                            non-steroidal anti-inflammatory drugs for 2 weeks.                           - Resume Coumadin (warfarin) at prior dose in 3                            days. Refer to managing physician for further                            adjustment of therapy. Procedure Code(s):        --- Professional ---                           [phone], Colonoscopy, flexible; with removal of                            tumor(s), polyp(s), or other lesion(s) by snare                            technique Diagnosis Code(s):        --- Professional ---                           Z12.11, Encounter for screening for malignant                            neoplasm of colon                           D12.2, Benign neoplasm of ascending colon                           K64.0, First degree hemorrhoids CPT copyright 2018 [employer]. All rights reserved. The codes documented in this report are preliminary and upon  coder review may  be revised to meet current compliance requirements. [name], MD 06/07/2018 10:07:20 AM This report has been signed electronically. Number of Addenda: 0

## 2018-06-07 NOTE — Anesthesia Procedure Notes (Signed)
Procedure Name: MAC Date/Time: 06/07/2018 9:12 AM Performed by: Maxwell Caul, CRNA Pre-anesthesia Checklist: Patient identified, Emergency Drugs available, Suction available and Patient being monitored Patient Re-evaluated:Patient Re-evaluated prior to induction Oxygen Delivery Method: Simple face mask

## 2018-06-08 ENCOUNTER — Emergency Department (HOSPITAL_COMMUNITY): Payer: Medicare Other

## 2018-06-08 ENCOUNTER — Encounter (HOSPITAL_COMMUNITY): Payer: Self-pay | Admitting: Gastroenterology

## 2018-06-08 ENCOUNTER — Emergency Department (HOSPITAL_COMMUNITY)
Admission: EM | Admit: 2018-06-08 | Discharge: 2018-06-08 | Disposition: A | Discharge status: Home / Self Care | Payer: Medicare Other | Attending: Emergency Medicine | Admitting: Emergency Medicine

## 2018-06-08 ENCOUNTER — Other Ambulatory Visit: Payer: Self-pay

## 2018-06-08 DIAGNOSIS — I110 Hypertensive heart disease with heart failure: Secondary | ICD-10-CM | POA: Diagnosis not present

## 2018-06-08 DIAGNOSIS — J439 Emphysema, unspecified: Secondary | ICD-10-CM | POA: Diagnosis not present

## 2018-06-08 DIAGNOSIS — J449 Chronic obstructive pulmonary disease, unspecified: Secondary | ICD-10-CM | POA: Diagnosis not present

## 2018-06-08 DIAGNOSIS — I5042 Chronic combined systolic (congestive) and diastolic (congestive) heart failure: Secondary | ICD-10-CM | POA: Insufficient documentation

## 2018-06-08 DIAGNOSIS — F17210 Nicotine dependence, cigarettes, uncomplicated: Secondary | ICD-10-CM | POA: Insufficient documentation

## 2018-06-08 DIAGNOSIS — T8130XA Disruption of wound, unspecified, initial encounter: Secondary | ICD-10-CM | POA: Diagnosis not present

## 2018-06-08 DIAGNOSIS — Z79899 Other long term (current) drug therapy: Secondary | ICD-10-CM | POA: Insufficient documentation

## 2018-06-08 DIAGNOSIS — R0789 Other chest pain: Secondary | ICD-10-CM | POA: Diagnosis not present

## 2018-06-08 DIAGNOSIS — R599 Enlarged lymph nodes, unspecified: Secondary | ICD-10-CM | POA: Diagnosis not present

## 2018-06-08 DIAGNOSIS — R590 Localized enlarged lymph nodes: Secondary | ICD-10-CM | POA: Diagnosis not present

## 2018-06-08 LAB — CBC WITH DIFFERENTIAL/PLATELET
Abs Immature Granulocytes: 0.05 10*3/uL (ref 0.00–0.07)
Basophils Absolute: 0.1 10*3/uL (ref 0.0–0.1)
Basophils Relative: 1 %
Eosinophils Absolute: 0.3 10*3/uL (ref 0.0–0.5)
Eosinophils Relative: 2 %
HCT: 36.2 % (ref 36.0–46.0)
Hemoglobin: 10.9 g/dL — ABNORMAL LOW (ref 12.0–15.0)
Immature Granulocytes: 0 %
LYMPHS PCT: 24 %
Lymphs Abs: 2.8 10*3/uL (ref 0.7–4.0)
MCH: 30.5 pg (ref 26.0–34.0)
MCHC: 30.1 g/dL (ref 30.0–36.0)
MCV: 101.4 fL — ABNORMAL HIGH (ref 80.0–100.0)
Monocytes Absolute: 0.9 10*3/uL (ref 0.1–1.0)
Monocytes Relative: 8 %
Neutro Abs: 7.6 10*3/uL (ref 1.7–7.7)
Neutrophils Relative %: 65 %
Platelets: 478 10*3/uL — ABNORMAL HIGH (ref 150–400)
RBC: 3.57 MIL/uL — ABNORMAL LOW (ref 3.87–5.11)
RDW: 11.6 % (ref 11.5–15.5)
WBC: 11.7 10*3/uL — ABNORMAL HIGH (ref 4.0–10.5)
nRBC: 0 % (ref 0.0–0.2)

## 2018-06-08 LAB — COMPREHENSIVE METABOLIC PANEL
ALT: 16 U/L (ref 0–44)
AST: 29 U/L (ref 15–41)
Albumin: 3.6 g/dL (ref 3.5–5.0)
Alkaline Phosphatase: 68 U/L (ref 38–126)
Anion gap: 11 (ref 5–15)
BUN: 6 mg/dL — AB (ref 8–23)
CHLORIDE: 105 mmol/L (ref 98–111)
CO2: 22 mmol/L (ref 22–32)
CREATININE: 0.75 mg/dL (ref 0.44–1.00)
Calcium: 9.4 mg/dL (ref 8.9–10.3)
GFR calc Af Amer: >60 mL/min (ref >60)
GFR calc non Af Amer: >60 mL/min (ref >60)
Glucose, Bld: 82 mg/dL (ref 70–99)
POTASSIUM: 3.8 mmol/L (ref 3.5–5.1)
Sodium: 138 mmol/L (ref 135–145)
Total Bilirubin: 1 mg/dL (ref 0.3–1.2)
Total Protein: 7.5 g/dL (ref 6.5–8.1)

## 2018-06-08 LAB — PROTIME-INR
INR: 1.03
PROTHROMBIN TIME: 13.5 s (ref 11.4–15.2)

## 2018-06-08 MED ORDER — HYDROCODONE-ACETAMINOPHEN 5-325 MG PO TABS
1.0000 | ORAL_TABLET | Freq: Once | ORAL | Status: AC
  Administered 2018-06-08: 1 via ORAL
  Filled 2018-06-08: qty 1

## 2018-06-08 MED ORDER — SODIUM CHLORIDE 0.9 % IV BOLUS
500.0000 mL | Freq: Once | INTRAVENOUS | Status: AC
  Administered 2018-06-08: 500 mL via INTRAVENOUS

## 2018-06-08 MED ORDER — KETOROLAC TROMETHAMINE 30 MG/ML IJ SOLN
15.0000 mg | Freq: Once | INTRAMUSCULAR | Status: AC
  Administered 2018-06-08: 15 mg via INTRAVENOUS
  Filled 2018-06-08: qty 1

## 2018-06-08 MED ORDER — IOHEXOL 300 MG/ML  SOLN
75.0000 mL | Freq: Once | INTRAMUSCULAR | Status: AC | PRN
  Administered 2018-06-08: 75 mL via INTRAVENOUS

## 2018-06-08 MED ORDER — HYDROCODONE-ACETAMINOPHEN 5-325 MG PO TABS: 1.0000 | ORAL_TABLET | Freq: Four times a day (QID) | ORAL | 0 refills | Status: DC | PRN

## 2018-06-08 NOTE — ED Notes (Signed)
Pt. To CT via stretcher. 

## 2018-06-08 NOTE — Discharge Instructions (Signed)
Follow up with your doctor

## 2018-06-08 NOTE — ED Provider Notes (Signed)
[place] EMERGENCY DEPARTMENT Provider Note   CSN: [id number] Arrival date & time: 06/08/18  1742     History   Chief Complaint Chief Complaint  Patient presents with  . Wound Check    HPI Kim Anthony is a 67 y.o. female.  HPI   She presents for evaluation of left-sided chest discomfort radiating to her left neck, present for 6 weeks.  No known trauma.  She is status post mastectomy, left, and complains of opening in the surgical wound, near the left axilla.  This area has been draining fluid.  She denies fever, chills, cough, shortness of breath, weakness or dizziness.  She has been following up for routine mammograms of her right breast, every 6 months.  She has not had problems with these.  She does not currently see oncology.  She denies fever, chills, vomiting, dysuria or change in bowel habits.  There are no other known modifying factors.  Past Medical History:  Diagnosis Date  . Asthma   . Breast cancer ([place])    Left Breast Cancer  . CHF (congestive heart failure) ([place])   . COPD (chronic obstructive pulmonary disease) [place])   . Coronary artery disease   . History of mitral valve replacement with mechanical valve   . HLD (hyperlipidemia)   . Hypertension   . Pulmonary embolism [facility])     Patient Active Problem List   Diagnosis Date Noted  . Screen for colon cancer 06/07/2018  . Open wound of axillary region 05/11/2018  . Postmenopausal vaginal bleeding 04/21/2018  . COPD (chronic obstructive pulmonary disease) ([place]) 11/25/2017  . S/P MVR (mitral valve replacement)   . Chronic anticoagulation   . Hx of breast cancer 05/27/2017  . Soft tissue mass 02/25/2017  . Tobacco use disorder 02/25/2017  . Lymphadenopathy, mediastinal 02/25/2017  . History of mitral valve replacement with mechanical valve 11/18/2016  . Essential hypertension 02/01/2016  . Allergic rhinitis 02/01/2016  . Healthcare maintenance 01/30/2016  . Chronic low back pain  07/22/2015  . Pulmonary embolism ([place]) 07/11/2015  . Chronic combined systolic and diastolic congestive heart failure ([place]) 07/11/2015  . Headache, common migraine 07/11/2015  . HLD (hyperlipidemia) 07/11/2015    Past Surgical History:  Procedure Laterality Date  . COLONOSCOPY WITH PROPOFOL N/A 06/07/2018   Procedure: COLONOSCOPY WITH PROPOFOL;  Surgeon: Wilford Corner, MD;  Location: [facility] ENDOSCOPY;  Service: Endoscopy;  Laterality: N/A;  . MASTECTOMY Left 2000s   for breast cancer, also s/p radiation and chemo  . MECHANICAL AORTIC AND MITRAL VALVE REPLACEMENT    . POLYPECTOMY  06/07/2018   Procedure: POLYPECTOMY;  Surgeon: Wilford Corner, MD;  Location: [facility] ENDOSCOPY;  Service: Endoscopy;;     OB History    Gravida  2   Para  2   Term  2   Preterm      AB      Living        SAB      TAB      Ectopic      Multiple      Live Births  2            Home Medications    Prior to Admission medications   Medication Sig Start Date End Date Taking? Authorizing Provider  albuterol (PROVENTIL HFA;VENTOLIN HFA) 108 (90 Base) MCG/ACT inhaler Inhale 1-2 puffs into the lungs every 6 (six) hours as needed for wheezing or shortness of breath. 12/07/17   Lenore Cordia, MD  albuterol (  PROVENTIL) (2.5 MG/3ML) 0.083% nebulizer solution Take 3 mLs (2.5 mg total) by nebulization every 6 (six) hours as needed for wheezing or shortness of breath. 11/26/17   Katherine Roan, MD  Baclofen 5 MG TABS TAKE 1 TABLET BY MOUTH EVERY 8 HOURS AS NEEDED Patient not taking: Reported on 06/03/2018 05/13/18   Isabelle Course, MD  Cholecalciferol (VITAMIN D3 PO) Take 1 capsule by mouth 2 (two) times daily.    [provider]  fluticasone (FLONASE) 50 MCG/ACT nasal spray Place 2 sprays into both nostrils daily. Patient taking differently: Place 1 spray into both nostrils daily.  12/03/16 04/11/26  Jule Ser, DO  furosemide (LASIX) 40 MG tablet Take 1 tablet (40 mg total) by mouth  daily. 04/06/18   Isabelle Course, MD  gabapentin (NEURONTIN) 600 MG tablet Take 600 mg by mouth 3 (three) times daily. 09/23/17   [provider]  lisinopril (PRINIVIL,ZESTRIL) 10 MG tablet TAKE 1 TABLET BY MOUTH ONCE DAILY Patient taking differently: Take 10 mg by mouth daily.  03/14/18   Oda Kilts, MD  metoprolol succinate (TOPROL-XL) 25 MG 24 hr tablet TAKE 1/2 (ONE-HALF) TABLET BY MOUTH ONCE DAILY Patient taking differently: Take 12.5 mg by mouth daily.  01/29/18   Isabelle Course, MD  potassium chloride SA (K-DUR,KLOR-CON) 20 MEQ tablet Take 20 mEq by mouth 2 (two) times daily.    [provider]  simvastatin (ZOCOR) 20 MG tablet TAKE 1 TABLET BY MOUTH ONCE DAILY Patient taking differently: Take 20 mg by mouth daily at 6 PM.  03/14/18   Oda Kilts, MD  umeclidinium-vilanterol (ANORO ELLIPTA) 62.5-25 MCG/INH AEPB Inhale 1 puff into the lungs daily. 11/26/17   Katherine Roan, MD  warfarin (COUMADIN) 5 MG tablet Take 1 tablet (5 mg total) by mouth daily. Except take 1/2 tablets (2.5 mg) Tuesdays and Thursdays Patient taking differently: Take 5-7.5 mg by mouth See admin instructions. Take 7.5 mg by mouth daily on Tuesday and Thursday. Take 5 mg by mouth daily on all other days. 03/24/18   Axel Filler, MD    Family History Family History  Problem Relation Age of Onset  . Heart attack Other   . Breast cancer Maternal Aunt 58    Social History Social History   Tobacco Use  . Smoking status: Current Every Day Smoker    Packs/day: 0.50    Types: Cigarettes  . Smokeless tobacco: Never Used  . Tobacco comment: Sometimes less.  Substance Use Topics  . Alcohol use: Yes    Comment: beer  . Drug use: No     Allergies   Acetaminophen-codeine; Other; and Tramadol   Review of Systems Review of Systems  All other systems reviewed and are negative.    Physical Exam Updated Vital Signs BP (!) 171/97 (BP Location: Right Arm)   Pulse 88    Temp 98.1 F (36.7 C) (Oral)   Resp 19   Physical Exam Vitals signs and nursing note reviewed.  Constitutional:      General: She is not in acute distress.    Appearance: Normal appearance. She is well-developed and normal weight. She is not ill-appearing or diaphoretic.  HENT:     Head: Normocephalic and atraumatic.     Nose: Nose normal.  Eyes:     Conjunctiva/sclera: Conjunctivae normal.     Pupils: Pupils are equal, round, and reactive to light.  Neck:     Musculoskeletal: Normal range of motion and neck supple.  Trachea: Phonation normal.  Cardiovascular:     Rate and Rhythm: Normal rate and regular rhythm.  Pulmonary:     Effort: Pulmonary effort is normal. No respiratory distress.     Breath sounds: Normal breath sounds. No stridor.     Comments: Mild tenderness of the chest wall, at the site of her horizontal incision.  The tenderness is primarily in the lateral aspect where the wound has dehisced approximately 3 cm, about 0.5 cm deep.  At the base of this wound there appears to be granulation tissue.  There is no associated fluctuance, bleeding or drainage of purulent fluid.  There are no palpable masses in the chest wall, or axilla.  Of note she has had a left axillary lymph node dissection.  There is a lymph node, in the left upper lateral neck, with associated edema, of the soft tissue in this region extending toward the chest wall.  This cervical lymph node is about 1.5 to 2 cm in length and 1.5 cm in width.  It is moderately tender.  There is no other adenopathy appreciated in the neck.  The neck exam is limited by edema of the left neck.  There is no associated edema of the left arm. Abdominal:     General: There is no distension.     Palpations: Abdomen is soft.     Tenderness: There is no abdominal tenderness. There is no guarding.  Musculoskeletal: Normal range of motion.  Skin:    General: Skin is warm and dry.  Neurological:     Mental Status: She is alert and  oriented to person, place, and time.     Motor: No abnormal muscle tone.  Psychiatric:        Behavior: Behavior normal.        Thought Content: Thought content normal.        Judgment: Judgment normal.      ED Treatments / Results  Labs (all labs ordered are listed, but only abnormal results are displayed) Labs Reviewed  COMPREHENSIVE METABOLIC PANEL  CBC WITH DIFFERENTIAL/PLATELET    EKG None  Radiology No results found.  Procedures Procedures (including critical care time)  Medications Ordered in ED Medications  sodium chloride 0.9 % bolus 500 mL (has no administration in time range)  ketorolac (TORADOL) 30 MG/ML injection 15 mg (has no administration in time range)     Initial Impression / Assessment and Plan / ED Course  I have reviewed the triage vital signs and the nursing notes.  Pertinent labs & imaging results that were available during my care of the patient were reviewed by me and considered in my medical decision making (see chart for details).      Patient Vitals for the past 24 hrs:  BP Temp Temp src Pulse Resp  06/08/18 1758 (!) 171/97 98.1 F (36.7 C) Oral 88 19      Medical Decision Making: Subacute pain swelling, wound dehiscence and neck discomfort with adenopathy.  Evaluation is consistent with wound infection versus recurrence of breast cancer.  Patient required further evaluation with blood work, and CT imaging.  Disposition will be performed following these test results.  CRITICAL CARE-no Performed by: Daleen Bo  Nursing Notes Reviewed/ Care Coordinated Applicable Imaging Reviewed Interpretation of Laboratory Data incorporated into ED treatment   Plan-evaluate patient after return of CT and labs, with consideration for etiology of her discomfort and initiation of appropriate treatment.  Care to oncoming provider team to evaluate at that time.  Final Clinical Impressions(s) / ED Diagnoses   Final diagnoses:  Chest wall pain    Wound dehiscence  Cervical lymphadenopathy    ED Discharge Orders    None       Daleen Bo, MD 06/08/18 0321

## 2018-06-08 NOTE — Anesthesia Postprocedure Evaluation (Signed)
Anesthesia Post Note  Patient: Kim Anthony  Procedure(s) Performed: COLONOSCOPY WITH PROPOFOL (N/A ) POLYPECTOMY     Patient location during evaluation: Endoscopy Anesthesia Type: MAC Level of consciousness: awake and alert Pain management: pain level controlled Vital Signs Assessment: post-procedure vital signs reviewed and stable Respiratory status: spontaneous breathing, nonlabored ventilation, respiratory function stable and patient connected to nasal cannula oxygen Cardiovascular status: stable and blood pressure returned to baseline Postop Assessment: no apparent nausea or vomiting Anesthetic complications: no    Last Vitals:  Vitals:   06/07/18 1016 06/07/18 1024  BP: (!) 108/50 (!) 105/58  Pulse: 74 66  Resp:  17  Temp:    SpO2: 97% 99%    Last Pain:  Vitals:   06/07/18 1024  TempSrc:   PainSc: 0-No pain                 Tiziana Cislo S

## 2018-06-08 NOTE — ED Triage Notes (Signed)
Pt states she had radiation and Left breast removal surgery 18 years ago. Pt states a month ago her surgical scar opened up around axilla area and has had drainage. Pt also complaining of left jaw pain when she moves her head. Pt states she feels a knot on her jaw.

## 2018-06-08 NOTE — ED Provider Notes (Signed)
  Physical Exam  BP 131/76   Pulse 75   Temp 98.1 F (36.7 C) (Oral)   Resp 16   SpO2 92%   Physical Exam  ED Course/Procedures     Procedures  MDM  CT is reassuring.  No clear recurrence.  Follow-up as an outpatient.  Discharge home with small amount of pain medicine.  Drug database reviewed.       Davonna Belling, MD 06/08/18 2059

## 2018-06-08 NOTE — ED Notes (Signed)
ED Provider at bedside. 

## 2018-06-09 ENCOUNTER — Telehealth: Payer: Self-pay | Admitting: Pharmacist

## 2018-06-09 DIAGNOSIS — I2699 Other pulmonary embolism without acute cor pulmonale: Secondary | ICD-10-CM

## 2018-06-09 MED ORDER — ENOXAPARIN SODIUM 120 MG/0.8ML ~~LOC~~ SOLN: 120.0000 mg | SUBCUTANEOUS | 0 refills | Status: DC

## 2018-06-09 NOTE — Telephone Encounter (Signed)
Patient called requesting anticoagulation instructions. Although I provided perioperative recommendations on 06/03/18, patient did not re-start enoxaparin or warfarin. Advised patient to re-start enoxaparin (120 mg daily) and warfarin (5 mg MWF, 7.5 mg all other days), RTC 4 days, call clinic if questions or concerns arise. Patient verbalized understanding by repeat back. Appointment scheduled in [facility]madin Clinic for 06/13/18.

## 2018-06-10 ENCOUNTER — Ambulatory Visit
Admission: RE | Admit: 2018-06-10 | Discharge: 2018-06-10 | Disposition: A | Discharge status: Home / Self Care | Payer: Medicare Other | Source: Ambulatory Visit | Attending: Internal Medicine | Admitting: Internal Medicine

## 2018-06-10 DIAGNOSIS — R922 Inconclusive mammogram: Secondary | ICD-10-CM | POA: Diagnosis not present

## 2018-06-10 DIAGNOSIS — N641 Fat necrosis of breast: Secondary | ICD-10-CM

## 2018-06-13 ENCOUNTER — Ambulatory Visit: Payer: Medicare Other

## 2018-06-13 ENCOUNTER — Ambulatory Visit (INDEPENDENT_AMBULATORY_CARE_PROVIDER_SITE_OTHER): Payer: Medicare Other | Admitting: Pharmacist

## 2018-06-13 ENCOUNTER — Encounter: Payer: Self-pay | Admitting: Pharmacist

## 2018-06-13 DIAGNOSIS — Z5181 Encounter for therapeutic drug level monitoring: Secondary | ICD-10-CM

## 2018-06-13 DIAGNOSIS — I2699 Other pulmonary embolism without acute cor pulmonale: Secondary | ICD-10-CM | POA: Diagnosis not present

## 2018-06-13 DIAGNOSIS — Z7901 Long term (current) use of anticoagulants: Secondary | ICD-10-CM | POA: Diagnosis not present

## 2018-06-13 LAB — POCT INR: INR: 1.3 — AB (ref 2.0–3.0)

## 2018-06-13 NOTE — Patient Instructions (Signed)
Patient instructed to take medications as defined in the Anti-coagulation Track section of this encounter.  Patient instructed to take today's dose.  Patient instructed to take 1 and 1/2 of your 5mg  peach-colored warfarin tablets by mouth, once daily at [facility] DAY--EXCEPT on Friday, take only one (1) tablet of your 5mg  peach-colored warfarin tablets on Friday. Patient has enoxaparin/Lovenox 120mg  strength syringes on-hand, quantum sufficient to CONTINUE to self-inject, 1 syringe (120mg ), subcutaneously every 24 hours as you have been doing. Continue injecting, once-daily 2 inches away from your belly-button until seen at next visit.  Patient verbalized understanding of these instructions.

## 2018-06-13 NOTE — Progress Notes (Signed)
INTERNAL MEDICINE TEACHING ATTENDING ADDENDUM ° °I agree with pharmacy recommendations as outlined in their note.  ° °-Duncan Vincent MD ° °

## 2018-06-13 NOTE — Progress Notes (Signed)
Anticoagulation Management Kim Anthony is a 67 y.o. female who reports to the clinic for monitoring of warfarin treatment.    Indication: PE, history of; Is currently on LMWH bridge therapy after a planned colonoscopy performed last Tuesday. She did not resume her warfarin + LMWH for two days after her colonoscopy.  Duration: indefinite Supervising physician: Lalla Brothers  Anticoagulation Clinic Visit History: Patient does not report signs/symptoms of bleeding or thromboembolism  Other recent changes: No diet, medications, lifestyle changes except as noted in patient findings.  Anticoagulation Episode Summary    Current INR goal:   2.5-3.5  TTR:   32.5 % (2.8 y)  Next INR check:   06/20/2018  INR from last check:   1.3! (06/13/2018)  Weekly max warfarin dose:     Target end date:     INR check location:   Anticoagulation Clinic  Preferred lab:     Send INR reminders to:      Indications   Pulmonary embolism (HCC) [I26.99]       Comments:           Allergies  Allergen Reactions  . Acetaminophen-Codeine Itching  . Other Itching and Other (See Comments)    Darvocet  . Tramadol Itching and Nausea And Vomiting   Prior to Admission medications   Medication Sig Start Date End Date Taking? Authorizing Provider  albuterol (PROVENTIL HFA;VENTOLIN HFA) 108 (90 Base) MCG/ACT inhaler Inhale 1-2 puffs into the lungs every 6 (six) hours as needed for wheezing or shortness of breath. 12/07/17  Yes Zada Finders R, MD  albuterol (PROVENTIL) (2.5 MG/3ML) 0.083% nebulizer solution Take 3 mLs (2.5 mg total) by nebulization every 6 (six) hours as needed for wheezing or shortness of breath. 11/26/17  Yes Katherine Roan, MD  Cholecalciferol (VITAMIN D3 PO) Take 1 capsule by mouth 2 (two) times daily.   Yes [provider]  enoxaparin (LOVENOX) 120 MG/0.8ML injection Inject 0.8 mLs (120 mg total) into the skin daily. 06/09/18  Yes Isabelle Course, MD  fluticasone [facility]) 50 MCG/ACT  nasal spray Place 2 sprays into both nostrils daily. Patient taking differently: Place 1 spray into both nostrils daily.  12/03/16 04/11/26 Yes Jule Ser, DO  furosemide (LASIX) 40 MG tablet Take 1 tablet (40 mg total) by mouth daily. 04/06/18  Yes Isabelle Course, MD  gabapentin (NEURONTIN) 600 MG tablet Take 600 mg by mouth 3 (three) times daily. 09/23/17  Yes [provider]  HYDROcodone-acetaminophen (NORCO/VICODIN) 5-325 MG tablet Take 1 tablet by mouth every 6 (six) hours as needed. 06/08/18  Yes Davonna Belling, MD  lisinopril (PRINIVIL,ZESTRIL) 10 MG tablet TAKE 1 TABLET BY MOUTH ONCE DAILY Patient taking differently: Take 10 mg by mouth daily.  03/14/18  Yes Oda Kilts, MD  metoprolol succinate (TOPROL-XL) 25 MG 24 hr tablet TAKE 1/2 (ONE-HALF) TABLET BY MOUTH ONCE DAILY Patient taking differently: Take 12.5 mg by mouth daily.  01/29/18  Yes Isabelle Course, MD  potassium chloride SA (K-DUR,KLOR-CON) 20 MEQ tablet Take 20 mEq by mouth 2 (two) times daily.   Yes [provider]  simvastatin (ZOCOR) 20 MG tablet TAKE 1 TABLET BY MOUTH ONCE DAILY Patient taking differently: Take 20 mg by mouth daily at 6 PM.  03/14/18  Yes Oda Kilts, MD  umeclidinium-vilanterol (ANORO ELLIPTA) 62.5-25 MCG/INH AEPB Inhale 1 puff into the lungs daily. 11/26/17  Yes Katherine Roan, MD  warfarin (COUMADIN) 5 MG tablet Take 1 tablet (5 mg total) by mouth  daily. Except take 1/2 tablets (2.5 mg) Tuesdays and Thursdays Patient taking differently: Take 5-7.5 mg by mouth See admin instructions. Take 7.5 mg by mouth daily on Tuesday and Thursday. Take 5 mg by mouth daily on all other days. 03/24/18  Yes Axel Filler, MD  Baclofen 5 MG TABS TAKE 1 TABLET BY MOUTH EVERY 8 HOURS AS NEEDED Patient not taking: Reported on 06/13/2018 05/13/18   Isabelle Course, MD   Past Medical History:  Diagnosis Date  . Asthma   . Breast cancer ([place])    Left Breast Cancer  . CHF  (congestive heart failure) ([place])   . COPD (chronic obstructive pulmonary disease) ([place])   . Coronary artery disease   . History of mitral valve replacement with mechanical valve   . HLD (hyperlipidemia)   . Hypertension   . Pulmonary embolism [facility])    Social History   Socioeconomic History  . Marital status: Single    Spouse name: Not on file  . Number of children: Not on file  . Years of education: Not on file  . Highest education level: Not on file  Occupational History  . Not on file  Social Needs  . Financial resource strain: Not on file  . Food insecurity:    Worry: Not on file    Inability: Not on file  . Transportation needs:    Medical: Not on file    Non-medical: Not on file  Tobacco Use  . Smoking status: Current Every Day Smoker    Packs/day: 0.50    Types: Cigarettes  . Smokeless tobacco: Never Used  . Tobacco comment: Sometimes less.  Substance and Sexual Activity  . Alcohol use: Yes    Comment: beer  . Drug use: No  . Sexual activity: Not Currently    Birth control/protection: None  Lifestyle  . Physical activity:    Days per week: Not on file    Minutes per session: Not on file  . Stress: Not on file  Relationships  . Social connections:    Talks on phone: Not on file    Gets together: Not on file    Attends religious service: Not on file    Active member of club or organization: Not on file    Attends meetings of clubs or organizations: Not on file    Relationship status: Not on file  Other Topics Concern  . Not on file  Social History Narrative   ** Merged History Encounter **       Family History  Problem Relation Age of Onset  . Heart attack Other   . Breast cancer Maternal Aunt 50    ASSESSMENT Recent Results: The most recent result is correlated with 45 mg per week: Lab Results  Component Value Date   INR 1.3 (A) 06/13/2018   INR 1.03 06/08/2018   INR 2.0 06/02/2018    Anticoagulation Dosing: Description   Patient has  enoxaparin/Lovenox 120mg  strength syringes on-hand, quantum sufficient to CONTINUE to self-inject, 1 syringe (120mg ), subcutaneously every 24 hours as you have been doing. Continue injecting, once-daily 2 inches away from your belly-button until seen at next visit.      INR today: Subtherapeutic  PLAN Weekly dose was increased by 11% to 50 mg per week with CONTINUED 120mg  enoxaparin/Lovenox, one-injection subcutaneously self-administered every twenty-four hours.   Patient Instructions  Patient instructed to take medications as defined in the Anti-coagulation Track section of this encounter.  Patient instructed to take today's dose.  Patient instructed to take 1 and 1/2 of your 5mg  peach-colored warfarin tablets by mouth, once daily at [facility]--EXCEPT on Friday, take only one (1) tablet of your 5mg  peach-colored warfarin tablets on Friday. Patient has enoxaparin/Lovenox 120mg  strength syringes on-hand, quantum sufficient to CONTINUE to self-inject, 1 syringe (120mg ), subcutaneously every 24 hours as you have been doing. Continue injecting, once-daily 2 inches away from your belly-button until seen at next visit.  Patient verbalized understanding of these instructions.     Patient advised to contact clinic or seek medical attention if signs/symptoms of bleeding or thromboembolism occur.  Patient verbalized understanding by repeating back information and was advised to contact me if further medication-related questions arise. Patient was also provided an information handout.  Follow-up Return in 1 week (on 06/20/2018) for Follow up INR at 0900h.  Pennie Banter, PharmD, CPP  15 minutes spent face-to-face with the patient during the encounter. 50% of time spent on education. 50% of time was spent on fingerstick point of care INR sample collection, processing, results determination, dose adjustment and documentation in [email or web address]/.

## 2018-06-20 ENCOUNTER — Ambulatory Visit: Payer: Medicare Other

## 2018-06-23 NOTE — Progress Notes (Signed)
INTERNAL MEDICINE TEACHING ATTENDING ADDENDUM - Acea Yagi M.D  Duration- indefninte, Indication- mechanical mitral valve, PE, INR- sub therapeutic. Agree with pharmacy recommendations as outlined in their note. Patient to initiate enoxaparin bridge while holding warfarin in preparation for her colonoscopy

## 2018-06-27 ENCOUNTER — Ambulatory Visit (INDEPENDENT_AMBULATORY_CARE_PROVIDER_SITE_OTHER): Payer: Medicare Other

## 2018-06-27 DIAGNOSIS — Z7901 Long term (current) use of anticoagulants: Secondary | ICD-10-CM | POA: Diagnosis not present

## 2018-06-27 DIAGNOSIS — I2699 Other pulmonary embolism without acute cor pulmonale: Secondary | ICD-10-CM

## 2018-06-27 DIAGNOSIS — Z5181 Encounter for therapeutic drug level monitoring: Secondary | ICD-10-CM

## 2018-06-27 LAB — POCT INR: INR: 5.3 — AB (ref 2.0–3.0)

## 2018-06-27 NOTE — Progress Notes (Signed)
Anticoagulation Management Kim Anthony is a 68 y.o. female who reports to the clinic for monitoring of warfarin treatment.    Indication: PE  Duration: indefinite Supervising physician: Gilles Chiquito  Anticoagulation Clinic Visit History: Patient does not report signs/symptoms of bleeding or thromboembolism  Other recent changes: Pt endorses no diet, medications, lifestyle other than a cold that prevented her from attending her appointment last week.  Anticoagulation Episode Summary    Current INR goal:   2.5-3.5  TTR:   32.4 % (2.9 y)  Next INR check:   07/04/2018  INR from last check:   5.3! (06/27/2018)  Weekly max warfarin dose:     Target end date:     INR check location:   Anticoagulation Clinic  Preferred lab:     Send INR reminders to:      Indications   Pulmonary embolism (HCC) [I26.99]       Comments:           Allergies  Allergen Reactions  . Acetaminophen-Codeine Itching  . Other Itching and Other (See Comments)    Darvocet  . Tramadol Itching and Nausea And Vomiting   Prior to Admission medications   Medication Sig Start Date End Date Taking? Authorizing Provider  albuterol (PROVENTIL HFA;VENTOLIN HFA) 108 (90 Base) MCG/ACT inhaler Inhale 1-2 puffs into the lungs every 6 (six) hours as needed for wheezing or shortness of breath. 12/07/17   Lenore Cordia, MD  albuterol (PROVENTIL) (2.5 MG/3ML) 0.083% nebulizer solution Take 3 mLs (2.5 mg total) by nebulization every 6 (six) hours as needed for wheezing or shortness of breath. 11/26/17   Katherine Roan, MD  Baclofen 5 MG TABS TAKE 1 TABLET BY MOUTH EVERY 8 HOURS AS NEEDED Patient not taking: Reported on 06/13/2018 05/13/18   Isabelle Course, MD  Cholecalciferol (VITAMIN D3 PO) Take 1 capsule by mouth 2 (two) times daily.    [provider]  enoxaparin (LOVENOX) 120 MG/0.8ML injection Inject 0.8 mLs (120 mg total) into the skin daily. 06/09/18   Isabelle Course, MD  fluticasone (FLONASE) 50 MCG/ACT nasal  spray Place 2 sprays into both nostrils daily. Patient taking differently: Place 1 spray into both nostrils daily.  12/03/16 04/11/26  Jule Ser, DO  furosemide (LASIX) 40 MG tablet Take 1 tablet (40 mg total) by mouth daily. 04/06/18   Isabelle Course, MD  gabapentin (NEURONTIN) 600 MG tablet Take 600 mg by mouth 3 (three) times daily. 09/23/17   [provider]  HYDROcodone-acetaminophen (NORCO/VICODIN) 5-325 MG tablet Take 1 tablet by mouth every 6 (six) hours as needed. 06/08/18   Davonna Belling, MD  lisinopril (PRINIVIL,ZESTRIL) 10 MG tablet TAKE 1 TABLET BY MOUTH ONCE DAILY Patient taking differently: Take 10 mg by mouth daily.  03/14/18   Oda Kilts, MD  metoprolol succinate (TOPROL-XL) 25 MG 24 hr tablet TAKE 1/2 (ONE-HALF) TABLET BY MOUTH ONCE DAILY Patient taking differently: Take 12.5 mg by mouth daily.  01/29/18   Isabelle Course, MD  potassium chloride SA (K-DUR,KLOR-CON) 20 MEQ tablet Take 20 mEq by mouth 2 (two) times daily.    [provider]  simvastatin (ZOCOR) 20 MG tablet TAKE 1 TABLET BY MOUTH ONCE DAILY Patient taking differently: Take 20 mg by mouth daily at 6 PM.  03/14/18   Oda Kilts, MD  umeclidinium-vilanterol (ANORO ELLIPTA) 62.5-25 MCG/INH AEPB Inhale 1 puff into the lungs daily. 11/26/17   Katherine Roan, MD  warfarin (COUMADIN) 5 MG tablet Take  1 tablet (5 mg total) by mouth daily. Except take 1/2 tablets (2.5 mg) Tuesdays and Thursdays Patient taking differently: Take 5-7.5 mg by mouth See admin instructions. Take 7.5 mg by mouth daily on Tuesday and Thursday. Take 5 mg by mouth daily on all other days. 03/24/18   Axel Filler, MD   Past Medical History:  Diagnosis Date  . Asthma   . Breast cancer ([place])    Left Breast Cancer  . CHF (congestive heart failure) ([place])   . COPD (chronic obstructive pulmonary disease) ([place])   . Coronary artery disease   . History of mitral valve replacement with mechanical valve   .  HLD (hyperlipidemia)   . Hypertension   . Pulmonary embolism [facility])    Social History   Socioeconomic History  . Marital status: Single    Spouse name: Not on file  . Number of children: Not on file  . Years of education: Not on file  . Highest education level: Not on file  Occupational History  . Not on file  Social Needs  . Financial resource strain: Not on file  . Food insecurity:    Worry: Not on file    Inability: Not on file  . Transportation needs:    Medical: Not on file    Non-medical: Not on file  Tobacco Use  . Smoking status: Current Every Day Smoker    Packs/day: 0.50    Types: Cigarettes  . Smokeless tobacco: Never Used  . Tobacco comment: Sometimes less.  Substance and Sexual Activity  . Alcohol use: Yes    Comment: beer  . Drug use: No  . Sexual activity: Not Currently    Birth control/protection: None  Lifestyle  . Physical activity:    Days per week: Not on file    Minutes per session: Not on file  . Stress: Not on file  Relationships  . Social connections:    Talks on phone: Not on file    Gets together: Not on file    Attends religious service: Not on file    Active member of club or organization: Not on file    Attends meetings of clubs or organizations: Not on file    Relationship status: Not on file  Other Topics Concern  . Not on file  Social History Narrative   ** Merged History Encounter **       Family History  Problem Relation Age of Onset  . Heart attack Other   . Breast cancer Maternal Aunt 50    ASSESSMENT Recent Results: The most recent result is correlated with 50 mg per week: Lab Results  Component Value Date   INR 5.3 (A) 06/27/2018   INR 1.3 (A) 06/13/2018   INR 1.03 06/08/2018    Anticoagulation Dosing: Description   Patient has warfarin 5 mg tablets on-hand, quantum sufficient to TAKE one tablet (5mg ) on SUNDAYS, TUESDAYS, and FRIDAYS, and one and one-half tablets on MONDAYS, [place], THURSDAYS , and  SATURDAYS. Patient instructed to STOP taking Lovenox injections.      INR today: Supratherapeutic  PLAN Weekly dose was decreased by 10% to 45 mg per week  There are no Patient Instructions on file for this visit. Patient advised to contact clinic or seek medical attention if signs/symptoms of bleeding or thromboembolism occur.  Patient verbalized understanding by repeating back information and was advised to contact me if further medication-related questions arise. Patient was also provided an information handout.  Follow-up Return in about  1 week (around 07/04/2018) for INR follow up.  Colton Z Whiteside  15 minutes spent face-to-face with the patient during the encounter. 50% of time spent on education. 50% of time was spent on point of care INR preparation, testing, results determination and documentation in [email or web address]

## 2018-06-27 NOTE — Patient Instructions (Signed)
Patient instructed to take medications as defined in the Anti-coagulation Track section of this encounter.  Patient instructed to HOLD today's dose.  Patient instructed to TAKE one tablet (5mg ) on SUNDAYS, TUESDAYS, and FRIDAYS, and one and one-half tablets on MONDAYS, [place], THURSDAYS , and SATURDAYS. Patient instructed to STOP taking Lovenox injections. Patient verbalized understanding of these instructions.

## 2018-06-29 NOTE — Progress Notes (Signed)
I reviewed the anticoagulation note.  Patient is on coumadin for PE.  His INR is elevated and his dose was decreased.

## 2018-07-01 ENCOUNTER — Other Ambulatory Visit (INDEPENDENT_AMBULATORY_CARE_PROVIDER_SITE_OTHER): Payer: Medicare Other | Admitting: Pharmacist

## 2018-07-01 DIAGNOSIS — Z5181 Encounter for therapeutic drug level monitoring: Secondary | ICD-10-CM

## 2018-07-01 DIAGNOSIS — I2699 Other pulmonary embolism without acute cor pulmonale: Secondary | ICD-10-CM

## 2018-07-01 DIAGNOSIS — Z7901 Long term (current) use of anticoagulants: Secondary | ICD-10-CM

## 2018-07-04 ENCOUNTER — Ambulatory Visit (INDEPENDENT_AMBULATORY_CARE_PROVIDER_SITE_OTHER): Payer: Medicare Other | Admitting: Pharmacist

## 2018-07-04 DIAGNOSIS — Z7901 Long term (current) use of anticoagulants: Secondary | ICD-10-CM | POA: Diagnosis not present

## 2018-07-04 DIAGNOSIS — I2699 Other pulmonary embolism without acute cor pulmonale: Secondary | ICD-10-CM | POA: Diagnosis not present

## 2018-07-04 DIAGNOSIS — Z5181 Encounter for therapeutic drug level monitoring: Secondary | ICD-10-CM | POA: Diagnosis not present

## 2018-07-04 LAB — POCT INR: INR: 3.9 — AB (ref 2.0–3.0)

## 2018-07-04 NOTE — Progress Notes (Signed)
I reviewed the anticoagulation note.  Patient INR above goal and coumadin decreased.

## 2018-07-04 NOTE — Patient Instructions (Addendum)
Patient instructed to take medications as defined in the Anti-coagulation Track section of this encounter.  Patient instructed to take today's dose.  Patient verbalized understanding of these instructions.  Patient has warfarin 5 mg tablets on-hand, quantum sufficient to TAKE one tablet (5mg ) SUNDAYS, MONDAYS, WEDNESDAYS, THURSDAYS and SATURDAYS, and one and one-half tablets on TUESDAYS AND FRIDAYS.

## 2018-07-04 NOTE — Progress Notes (Signed)
Patient was seen in clinic with Leron Croak, PharmD, PGY1 pharmacy resident. I agree with the assessment and plan of care documented.

## 2018-07-04 NOTE — Progress Notes (Addendum)
Anticoagulation Management Kim Anthony is a 68 y.o. female who reports to the clinic for monitoring of warfarin treatment.    Indication: PE  Duration: indefinite Supervising physician: Gilles Chiquito  Anticoagulation Clinic Visit History: Patient does not report signs/symptoms of bleeding or thromboembolism at this time. Other recent changes: No diet, medications, lifestyle changes endorsed by patient.  Anticoagulation Episode Summary    Current INR goal:   2.5-3.5  TTR:   32.1 % (2.9 y)  Next INR check:   07/11/2018  INR from last check:   3.9! (07/04/2018)  Weekly max warfarin dose:     Target end date:     INR check location:   Anticoagulation Clinic  Preferred lab:     Send INR reminders to:      Indications   Pulmonary embolism (HCC) [I26.99]       Comments:           Allergies  Allergen Reactions  . Acetaminophen-Codeine Itching  . Other Itching and Other (See Comments)    Darvocet  . Tramadol Itching and Nausea And Vomiting   Prior to Admission medications   Medication Sig Start Date End Date Taking? Authorizing Provider  albuterol (PROVENTIL HFA;VENTOLIN HFA) 108 (90 Base) MCG/ACT inhaler Inhale 1-2 puffs into the lungs every 6 (six) hours as needed for wheezing or shortness of breath. 12/07/17  Yes Zada Finders R, MD  albuterol (PROVENTIL) (2.5 MG/3ML) 0.083% nebulizer solution Take 3 mLs (2.5 mg total) by nebulization every 6 (six) hours as needed for wheezing or shortness of breath. 11/26/17  Yes Katherine Roan, MD  Baclofen 5 MG TABS TAKE 1 TABLET BY MOUTH EVERY 8 HOURS AS NEEDED 05/13/18  Yes Isabelle Course, MD  Cholecalciferol (VITAMIN D3 PO) Take 1 capsule by mouth 2 (two) times daily.   Yes [provider]  enoxaparin (LOVENOX) 120 MG/0.8ML injection Inject 0.8 mLs (120 mg total) into the skin daily. 06/09/18  Yes Isabelle Course, MD  fluticasone [facility]) 50 MCG/ACT nasal spray Place 2 sprays into both nostrils daily. Patient taking differently:  Place 1 spray into both nostrils daily.  12/03/16 04/11/26 Yes Jule Ser, DO  furosemide (LASIX) 40 MG tablet Take 1 tablet (40 mg total) by mouth daily. 04/06/18  Yes Isabelle Course, MD  gabapentin (NEURONTIN) 600 MG tablet Take 600 mg by mouth 3 (three) times daily. 09/23/17  Yes [provider]  HYDROcodone-acetaminophen (NORCO/VICODIN) 5-325 MG tablet Take 1 tablet by mouth every 6 (six) hours as needed. 06/08/18  Yes Davonna Belling, MD  lisinopril (PRINIVIL,ZESTRIL) 10 MG tablet TAKE 1 TABLET BY MOUTH ONCE DAILY Patient taking differently: Take 10 mg by mouth daily.  03/14/18  Yes Oda Kilts, MD  metoprolol succinate (TOPROL-XL) 25 MG 24 hr tablet TAKE 1/2 (ONE-HALF) TABLET BY MOUTH ONCE DAILY Patient taking differently: Take 12.5 mg by mouth daily.  01/29/18  Yes Isabelle Course, MD  potassium chloride SA (K-DUR,KLOR-CON) 20 MEQ tablet Take 20 mEq by mouth 2 (two) times daily.   Yes [provider]  simvastatin (ZOCOR) 20 MG tablet TAKE 1 TABLET BY MOUTH ONCE DAILY Patient taking differently: Take 20 mg by mouth daily at 6 PM.  03/14/18  Yes Oda Kilts, MD  umeclidinium-vilanterol (ANORO ELLIPTA) 62.5-25 MCG/INH AEPB Inhale 1 puff into the lungs daily. 11/26/17  Yes Katherine Roan, MD  warfarin (COUMADIN) 5 MG tablet Take 1 tablet (5 mg total) by mouth daily. Except take 1/2 tablets (2.5 mg) Tuesdays  and Thursdays Patient taking differently: Take 5-7.5 mg by mouth See admin instructions. Take 7.5 mg by mouth daily on Tuesday and Thursday. Take 5 mg by mouth daily on all other days. 03/24/18  Yes Axel Filler, MD   Past Medical History:  Diagnosis Date  . Asthma   . Breast cancer ([place])    Left Breast Cancer  . CHF (congestive heart failure) ([place])   . COPD (chronic obstructive pulmonary disease) ([place])   . Coronary artery disease   . History of mitral valve replacement with mechanical valve   . HLD (hyperlipidemia)   . Hypertension   .  Pulmonary embolism [facility])    Social History   Socioeconomic History  . Marital status: Single    Spouse name: Not on file  . Number of children: Not on file  . Years of education: Not on file  . Highest education level: Not on file  Occupational History  . Not on file  Social Needs  . Financial resource strain: Not on file  . Food insecurity:    Worry: Not on file    Inability: Not on file  . Transportation needs:    Medical: Not on file    Non-medical: Not on file  Tobacco Use  . Smoking status: Current Every Day Smoker    Packs/day: 0.50    Types: Cigarettes  . Smokeless tobacco: Never Used  . Tobacco comment: Sometimes less.  Substance and Sexual Activity  . Alcohol use: Yes    Comment: beer  . Drug use: No  . Sexual activity: Not Currently    Birth control/protection: None  Lifestyle  . Physical activity:    Days per week: Not on file    Minutes per session: Not on file  . Stress: Not on file  Relationships  . Social connections:    Talks on phone: Not on file    Gets together: Not on file    Attends religious service: Not on file    Active member of club or organization: Not on file    Attends meetings of clubs or organizations: Not on file    Relationship status: Not on file  Other Topics Concern  . Not on file  Social History Narrative   ** Merged History Encounter **       Family History  Problem Relation Age of Onset  . Heart attack Other   . Breast cancer Maternal Aunt 50    ASSESSMENT Recent Results: The most recent result is correlated with 45 mg per week: Lab Results  Component Value Date   INR 3.9 (A) 07/04/2018   INR 5.3 (A) 06/27/2018   INR 1.3 (A) 06/13/2018    Anticoagulation Dosing: Description   Patient has warfarin 5 mg tablets on-hand, quantum sufficient to TAKE one tablet (5mg ) SUNDAYS, MONDAYS, WEDNESDAYS, THURSDAYS and SATURDAYS, and one and one-half tablets on TUESDAYS AND FRIDAYS.     INR today:  Supratherapeutic  PLAN Weekly dose was decreased by 11% to 40 mg per week  Patient Instructions  Patient instructed to take medications as defined in the Anti-coagulation Track section of this encounter.  Patient instructed to take today's dose.  Patient verbalized understanding of these instructions.  Patient has warfarin 5 mg tablets on-hand, quantum sufficient to TAKE one tablet (5mg ) SUNDAYS, MONDAYS, WEDNESDAYS, THURSDAYS and SATURDAYS, and one and one-half tablets on TUESDAYS AND FRIDAYS.  Patient advised to contact clinic or seek medical attention if signs/symptoms of bleeding or thromboembolism occur.  Patient  verbalized understanding by repeating back information and was advised to contact me if further medication-related questions arise. Patient was also provided an information handout.  Follow-up Return in 1 week (on 07/11/2018) for INR FOLLOW-UP AT 08:45.  15 minutes spent face-to-face with the patient during the encounter. 50% of time spent on education. 50% of time was spent on point of care INR testing, INR interpretation, dose adjustment, patient counseling, and documentation in [email or web address].  Thank you for allowing pharmacy to be a part of this patient's care.  Leron Croak, PharmD PGY1 Pharmacy Resident Phone: [phone]  Please check AMION for all [place] phone numbers

## 2018-07-12 ENCOUNTER — Ambulatory Visit: Payer: Medicare Other | Admitting: Pharmacist

## 2018-07-14 ENCOUNTER — Ambulatory Visit (INDEPENDENT_AMBULATORY_CARE_PROVIDER_SITE_OTHER): Payer: Medicare Other | Admitting: Pharmacist

## 2018-07-14 DIAGNOSIS — Z7901 Long term (current) use of anticoagulants: Secondary | ICD-10-CM

## 2018-07-14 DIAGNOSIS — I2699 Other pulmonary embolism without acute cor pulmonale: Secondary | ICD-10-CM

## 2018-07-14 DIAGNOSIS — Z5181 Encounter for therapeutic drug level monitoring: Secondary | ICD-10-CM | POA: Diagnosis not present

## 2018-07-14 LAB — POCT INR: INR: 4.6 — AB (ref 2.0–3.0)

## 2018-07-14 MED ORDER — WARFARIN SODIUM 5 MG PO TABS: 5.0000 mg | ORAL_TABLET | Freq: Every day | ORAL | 3 refills | Status: DC

## 2018-07-14 NOTE — Patient Instructions (Signed)
Patient educated about medication as defined in this encounter and verbalized understanding by repeating back instructions provided.   

## 2018-07-14 NOTE — Progress Notes (Signed)
Anticoagulation Management Kim Anthony is a 68 y.o. female who reports to the clinic for monitoring of warfarin treatment.    Indication: PE, history of; Is currently on LMWH bridge therapy after a planned colonoscopy performed last Tuesday. She did not resume her warfarin + LMWH for two days after her colonoscopy.  Duration: indefinite Supervising physician: Lalla Brothers  Anticoagulation Clinic Visit History: Patient does not report signs/symptoms of bleeding or thromboembolism  Other recent changes: No diet, medications, lifestyle changes except as noted in patient findings.   Anticoagulation Episode Summary    Current INR goal:   2.5-3.5  TTR:   31.8 % (2.9 y)  Next INR check:   07/20/2018  INR from last check:   4.6! (07/14/2018)  Weekly max warfarin dose:     Target end date:     INR check location:   Anticoagulation Clinic  Preferred lab:     Send INR reminders to:      Indications   Pulmonary embolism (HCC) [I26.99]       Comments:          Allergies  Allergen Reactions  . Acetaminophen-Codeine Itching  . Other Itching and Other (See Comments)    Darvocet  . Tramadol Itching and Nausea And Vomiting   Medication Sig  albuterol (PROVENTIL HFA;VENTOLIN HFA) 108 (90 Base) MCG/ACT inhaler Inhale 1-2 puffs into the lungs every 6 (six) hours as needed for wheezing or shortness of breath.  albuterol (PROVENTIL) (2.5 MG/3ML) 0.083% nebulizer solution Take 3 mLs (2.5 mg total) by nebulization every 6 (six) hours as needed for wheezing or shortness of breath.  Cholecalciferol (VITAMIN D3 PO) Take 1 capsule by mouth 2 (two) times daily.  fluticasone (FLONASE) 50 MCG/ACT nasal spray Place 2 sprays into both nostrils daily. Patient taking differently: Place 1 spray into both nostrils daily.   furosemide (LASIX) 40 MG tablet Take 1 tablet (40 mg total) by mouth daily.  gabapentin (NEURONTIN) 600 MG tablet Take 600 mg by mouth 3 (three) times daily.  HYDROcodone-acetaminophen  (NORCO/VICODIN) 5-325 MG tablet Take 1 tablet by mouth every 6 (six) hours as needed.  lisinopril (PRINIVIL,ZESTRIL) 10 MG tablet TAKE 1 TABLET BY MOUTH ONCE DAILY Patient taking differently: Take 10 mg by mouth daily.   metoprolol succinate (TOPROL-XL) 25 MG 24 hr tablet TAKE 1/2 (ONE-HALF) TABLET BY MOUTH ONCE DAILY Patient taking differently: Take 12.5 mg by mouth daily.   potassium chloride SA (K-DUR,KLOR-CON) 20 MEQ tablet Take 20 mEq by mouth 2 (two) times daily.  simvastatin (ZOCOR) 20 MG tablet TAKE 1 TABLET BY MOUTH ONCE DAILY Patient taking differently: Take 20 mg by mouth daily at 6 PM.   umeclidinium-vilanterol (ANORO ELLIPTA) 62.5-25 MCG/INH AEPB Inhale 1 puff into the lungs daily.  warfarin (COUMADIN) 5 MG tablet Take 1 tablet (5 mg total) by mouth daily.   Past Medical History:  Diagnosis Date  . Asthma   . Breast cancer ([place])    Left Breast Cancer  . CHF (congestive heart failure) ([place])   . COPD (chronic obstructive pulmonary disease) ([place])   . Coronary artery disease   . History of mitral valve replacement with mechanical valve   . HLD (hyperlipidemia)   . Hypertension   . Pulmonary embolism [facility])    Social History   Socioeconomic History  . Marital status: Single    Spouse name: Not on file  . Number of children: Not on file  . Years of education: Not on file  . Highest education level:  Not on file  Occupational History  . Not on file  Social Needs  . Financial resource strain: Not on file  . Food insecurity:    Worry: Not on file    Inability: Not on file  . Transportation needs:    Medical: Not on file    Non-medical: Not on file  Tobacco Use  . Smoking status: Current Every Day Smoker    Packs/day: 0.50    Types: Cigarettes  . Smokeless tobacco: Never Used  . Tobacco comment: Sometimes less.  Substance and Sexual Activity  . Alcohol use: Yes    Comment: beer  . Drug use: No  . Sexual activity: Not Currently    Birth control/protection: None   Lifestyle  . Physical activity:    Days per week: Not on file    Minutes per session: Not on file  . Stress: Not on file  Relationships  . Social connections:    Talks on phone: Not on file    Gets together: Not on file    Attends religious service: Not on file    Active member of club or organization: Not on file    Attends meetings of clubs or organizations: Not on file    Relationship status: Not on file  Other Topics Concern  . Not on file  Social History Narrative   ** Merged History Encounter **       Family History  Problem Relation Age of Onset  . Heart attack Other   . Breast cancer Maternal Aunt 50   ASSESSMENT Lab Results  Component Value Date   INR 4.6 (A) 07/14/2018   INR 3.9 (A) 07/04/2018   INR 5.3 (A) 06/27/2018   Anticoagulation Dosing: Description   Patient has warfarin 5 mg tablets on-hand, quantum sufficient to TAKE one tablet (5mg ) SUNDAYS, MONDAYS, WEDNESDAYS, THURSDAYS and SATURDAYS, and one and one-half tablets on TUESDAYS AND FRIDAYS.     INR today: Supratherapeutic, potentially due to dietary changes and smoking cessation, patient reports being down to 4 cigarettes per day  PLAN Weekly dose was decreased by 13% to 35 mg per week, provided education on consistent dietary intake  Patient Instructions  Patient educated about medication as defined in this encounter and verbalized understanding by repeating back instructions provided  Patient advised to contact clinic or seek medical attention if signs/symptoms of bleeding or thromboembolism occur.  Patient verbalized understanding by repeating back information and was advised to contact me if further medication-related questions arise. Patient was also provided an information handout.  Follow-up Return in about 1 week (around 07/21/2018).  [name]

## 2018-07-14 NOTE — Progress Notes (Signed)
INTERNAL MEDICINE TEACHING ATTENDING ADDENDUM ° °I agree with pharmacy recommendations as outlined in their note.  ° °-Duncan Vincent MD ° °

## 2018-07-16 ENCOUNTER — Encounter (HOSPITAL_COMMUNITY): Payer: Self-pay | Admitting: Emergency Medicine

## 2018-07-16 ENCOUNTER — Emergency Department (HOSPITAL_COMMUNITY): Payer: Medicare Other

## 2018-07-16 ENCOUNTER — Emergency Department (HOSPITAL_COMMUNITY)
Admission: EM | Admit: 2018-07-16 | Discharge: 2018-07-16 | Disposition: A | Discharge status: Home / Self Care | Payer: Medicare Other | Attending: Emergency Medicine | Admitting: Emergency Medicine

## 2018-07-16 DIAGNOSIS — Z79899 Other long term (current) drug therapy: Secondary | ICD-10-CM | POA: Diagnosis not present

## 2018-07-16 DIAGNOSIS — I2510 Atherosclerotic heart disease of native coronary artery without angina pectoris: Secondary | ICD-10-CM | POA: Diagnosis not present

## 2018-07-16 DIAGNOSIS — Z7901 Long term (current) use of anticoagulants: Secondary | ICD-10-CM | POA: Diagnosis not present

## 2018-07-16 DIAGNOSIS — Z86711 Personal history of pulmonary embolism: Secondary | ICD-10-CM | POA: Insufficient documentation

## 2018-07-16 DIAGNOSIS — I509 Heart failure, unspecified: Secondary | ICD-10-CM | POA: Diagnosis not present

## 2018-07-16 DIAGNOSIS — F17210 Nicotine dependence, cigarettes, uncomplicated: Secondary | ICD-10-CM | POA: Diagnosis not present

## 2018-07-16 DIAGNOSIS — J449 Chronic obstructive pulmonary disease, unspecified: Secondary | ICD-10-CM | POA: Insufficient documentation

## 2018-07-16 DIAGNOSIS — L0291 Cutaneous abscess, unspecified: Secondary | ICD-10-CM

## 2018-07-16 DIAGNOSIS — Z853 Personal history of malignant neoplasm of breast: Secondary | ICD-10-CM | POA: Insufficient documentation

## 2018-07-16 DIAGNOSIS — I10 Essential (primary) hypertension: Secondary | ICD-10-CM | POA: Diagnosis not present

## 2018-07-16 DIAGNOSIS — G8918 Other acute postprocedural pain: Secondary | ICD-10-CM | POA: Diagnosis present

## 2018-07-16 DIAGNOSIS — M25512 Pain in left shoulder: Secondary | ICD-10-CM | POA: Diagnosis not present

## 2018-07-16 DIAGNOSIS — L02412 Cutaneous abscess of left axilla: Secondary | ICD-10-CM | POA: Diagnosis not present

## 2018-07-16 DIAGNOSIS — R52 Pain, unspecified: Secondary | ICD-10-CM

## 2018-07-16 LAB — BASIC METABOLIC PANEL
Anion gap: 9 (ref 5–15)
BUN: 14 mg/dL (ref 8–23)
CO2: 22 mmol/L (ref 22–32)
Calcium: 9.2 mg/dL (ref 8.9–10.3)
Chloride: 105 mmol/L (ref 98–111)
Creatinine, Ser: 0.63 mg/dL (ref 0.44–1.00)
GFR calc Af Amer: >60 mL/min (ref >60)
GFR calc non Af Amer: >60 mL/min (ref >60)
Glucose, Bld: 100 mg/dL — ABNORMAL HIGH (ref 70–99)
Potassium: 4.5 mmol/L (ref 3.5–5.1)
SODIUM: 136 mmol/L (ref 135–145)

## 2018-07-16 LAB — CBC WITH DIFFERENTIAL/PLATELET
Abs Immature Granulocytes: 0.07 10*3/uL (ref 0.00–0.07)
BASOS ABS: 0.1 10*3/uL (ref 0.0–0.1)
Basophils Relative: 1 %
Eosinophils Absolute: 0.3 10*3/uL (ref 0.0–0.5)
Eosinophils Relative: 3 %
HCT: 38.1 % (ref 36.0–46.0)
Hemoglobin: 12.1 g/dL (ref 12.0–15.0)
Immature Granulocytes: 1 %
Lymphocytes Relative: 24 %
Lymphs Abs: 2.5 10*3/uL (ref 0.7–4.0)
MCH: 31.7 pg (ref 26.0–34.0)
MCHC: 31.8 g/dL (ref 30.0–36.0)
MCV: 99.7 fL (ref 80.0–100.0)
Monocytes Absolute: 1 10*3/uL (ref 0.1–1.0)
Monocytes Relative: 10 %
Neutro Abs: 6.3 10*3/uL (ref 1.7–7.7)
Neutrophils Relative %: 61 %
PLATELETS: 477 10*3/uL — AB (ref 150–400)
RBC: 3.82 MIL/uL — ABNORMAL LOW (ref 3.87–5.11)
RDW: 11.9 % (ref 11.5–15.5)
WBC: 10.3 10*3/uL (ref 4.0–10.5)
nRBC: 0 % (ref 0.0–0.2)

## 2018-07-16 MED ORDER — CLINDAMYCIN HCL 300 MG PO CAPS: 300.0000 mg | ORAL_CAPSULE | Freq: Three times a day (TID) | ORAL | 0 refills | Status: AC

## 2018-07-16 MED ORDER — FENTANYL CITRATE (PF) 100 MCG/2ML IJ SOLN
50.0000 ug | Freq: Once | INTRAMUSCULAR | Status: AC
  Administered 2018-07-16: 50 ug via INTRAVENOUS
  Filled 2018-07-16: qty 2

## 2018-07-16 MED ORDER — OXYCODONE HCL 5 MG PO TABS
5.0000 mg | ORAL_TABLET | Freq: Once | ORAL | Status: AC
  Administered 2018-07-16: 5 mg via ORAL
  Filled 2018-07-16: qty 1

## 2018-07-16 MED ORDER — CYCLOBENZAPRINE HCL 10 MG PO TABS
5.0000 mg | ORAL_TABLET | Freq: Once | ORAL | Status: DC
  Filled 2018-07-16: qty 1

## 2018-07-16 MED ORDER — OXYCODONE HCL 5 MG PO TABS: 5.0000 mg | ORAL_TABLET | Freq: Four times a day (QID) | ORAL | 0 refills | Status: DC | PRN

## 2018-07-16 NOTE — ED Triage Notes (Signed)
Patient c/o increased pain at L axilla surgical incision (from 3 months ago) - small amount of wound dehiscence noted with yellow drainage per patient. She states she has L arm pain as well. Denies fevers. No obvious signs of infection around wound.

## 2018-07-16 NOTE — ED Provider Notes (Signed)
[facility] EMERGENCY DEPARTMENT Provider Note   CSN: [id number] Arrival date & time: 07/16/18  8546     History   Chief Complaint Chief Complaint  Patient presents with  . Wound Check    HPI Kim Anthony is a 68 y.o. female.  The history is provided by the patient.  Wound Check  This is a chronic problem. The current episode started more than 1 week ago. The problem occurs daily. The problem has not changed since onset.Pertinent negatives include no chest pain, no abdominal pain and no shortness of breath. Exacerbated by: left arm movement. Nothing relieves the symptoms. She has tried nothing for the symptoms. The treatment provided no relief.    Past Medical History:  Diagnosis Date  . Asthma   . Breast cancer ([place])    Left Breast Cancer  . CHF (congestive heart failure) ([place])   . COPD (chronic obstructive pulmonary disease) ([place])   . Coronary artery disease   . History of mitral valve replacement with mechanical valve   . HLD (hyperlipidemia)   . Hypertension   . Pulmonary embolism [facility])     Patient Active Problem List   Diagnosis Date Noted  . Screen for colon cancer 06/07/2018  . Open wound of axillary region 05/11/2018  . Postmenopausal vaginal bleeding 04/21/2018  . COPD (chronic obstructive pulmonary disease) ([place]) 11/25/2017  . S/P MVR (mitral valve replacement)   . Chronic anticoagulation   . Hx of breast cancer 05/27/2017  . Soft tissue mass 02/25/2017  . Tobacco use disorder 02/25/2017  . Lymphadenopathy, mediastinal 02/25/2017  . History of mitral valve replacement with mechanical valve 11/18/2016  . Essential hypertension 02/01/2016  . Allergic rhinitis 02/01/2016  . Healthcare maintenance 01/30/2016  . Chronic low back pain 07/22/2015  . Pulmonary embolism ([place]) 07/11/2015  . Chronic combined systolic and diastolic congestive heart failure ([place]) 07/11/2015  . Headache, common migraine 07/11/2015  . HLD (hyperlipidemia)  07/11/2015    Past Surgical History:  Procedure Laterality Date  . COLONOSCOPY WITH PROPOFOL N/A 06/07/2018   Procedure: COLONOSCOPY WITH PROPOFOL;  Surgeon: Wilford Corner, MD;  Location: [facility] ENDOSCOPY;  Service: Endoscopy;  Laterality: N/A;  . MASTECTOMY Left 2000s   for breast cancer, also s/p radiation and chemo  . MECHANICAL AORTIC AND MITRAL VALVE REPLACEMENT    . POLYPECTOMY  06/07/2018   Procedure: POLYPECTOMY;  Surgeon: Wilford Corner, MD;  Location: [facility] ENDOSCOPY;  Service: Endoscopy;;     OB History    Gravida  2   Para  2   Term  2   Preterm      AB      Living        SAB      TAB      Ectopic      Multiple      Live Births  2            Home Medications    Prior to Admission medications   Medication Sig Start Date End Date Taking? Authorizing Provider  albuterol (PROVENTIL HFA;VENTOLIN HFA) 108 (90 Base) MCG/ACT inhaler Inhale 1-2 puffs into the lungs every 6 (six) hours as needed for wheezing or shortness of breath. Patient taking differently: Inhale 1-2 puffs into the lungs 2 (two) times daily.  12/07/17  Yes Lenore Cordia, MD  Cholecalciferol (VITAMIN D3 PO) Take 1 capsule by mouth 2 (two) times daily.   Yes [provider]  fluticasone (FLONASE) 50 MCG/ACT nasal spray Place 2  sprays into both nostrils daily. Patient taking differently: Place 1 spray into both nostrils daily.  12/03/16 04/11/26 Yes Jule Ser, DO  furosemide (LASIX) 40 MG tablet Take 1 tablet (40 mg total) by mouth daily. Patient taking differently: Take 40 mg by mouth at bedtime.  04/06/18  Yes Isabelle Course, MD  gabapentin (NEURONTIN) 600 MG tablet Take 600 mg by mouth 3 (three) times daily. 09/23/17  Yes [provider]  lisinopril (PRINIVIL,ZESTRIL) 10 MG tablet TAKE 1 TABLET BY MOUTH ONCE DAILY Patient taking differently: Take 10 mg by mouth at bedtime.  03/14/18  Yes Oda Kilts, MD  metoprolol succinate (TOPROL-XL) 25 MG 24 hr tablet  TAKE 1/2 (ONE-HALF) TABLET BY MOUTH ONCE DAILY Patient taking differently: Take 12.5 mg by mouth at bedtime.  01/29/18  Yes Isabelle Course, MD  potassium chloride SA (K-DUR,KLOR-CON) 20 MEQ tablet Take 20 mEq by mouth 2 (two) times daily.   Yes [provider]  simvastatin (ZOCOR) 20 MG tablet TAKE 1 TABLET BY MOUTH ONCE DAILY Patient taking differently: Take 20 mg by mouth daily at 6 PM.  03/14/18  Yes Oda Kilts, MD  umeclidinium-vilanterol (ANORO ELLIPTA) 62.5-25 MCG/INH AEPB Inhale 1 puff into the lungs daily. 11/26/17  Yes Katherine Roan, MD  warfarin (COUMADIN) 5 MG tablet Take 1 tablet (5 mg total) by mouth daily. Patient taking differently: Take 5 mg by mouth daily at 6 PM.  07/14/18  Yes Forde Dandy, PharmD  albuterol (PROVENTIL) (2.5 MG/3ML) 0.083% nebulizer solution Take 3 mLs (2.5 mg total) by nebulization every 6 (six) hours as needed for wheezing or shortness of breath. Patient not taking: Reported on 07/16/2018 11/26/17   Katherine Roan, MD  clindamycin (CLEOCIN) 300 MG capsule Take 1 capsule (300 mg total) by mouth 3 (three) times daily for 10 days. 07/16/18 07/26/18  Temiloluwa Recchia, DO  HYDROcodone-acetaminophen (NORCO/VICODIN) 5-325 MG tablet Take 1 tablet by mouth every 6 (six) hours as needed. Patient not taking: Reported on 07/16/2018 06/08/18   Davonna Belling, MD  oxyCODONE (ROXICODONE) 5 MG immediate release tablet Take 1 tablet (5 mg total) by mouth every 6 (six) hours as needed for up to 10 doses for severe pain. 07/16/18   Lennice Sites, DO    Family History Family History  Problem Relation Age of Onset  . Heart attack Other   . Breast cancer Maternal Aunt 81    Social History Social History   Tobacco Use  . Smoking status: Current Every Day Smoker    Packs/day: 0.50    Types: Cigarettes  . Smokeless tobacco: Never Used  . Tobacco comment: Sometimes less.  Substance Use Topics  . Alcohol use: Yes    Comment: beer  . Drug use: No      Allergies   Acetaminophen-codeine; Other; and Tramadol   Review of Systems Review of Systems  Constitutional: Negative for chills and fever.  HENT: Negative for ear pain and sore throat.   Eyes: Negative for pain and visual disturbance.  Respiratory: Negative for cough and shortness of breath.   Cardiovascular: Negative for chest pain and palpitations.  Gastrointestinal: Negative for abdominal pain and vomiting.  Genitourinary: Negative for dysuria and hematuria.  Musculoskeletal: Positive for arthralgias. Negative for back pain, neck pain and neck stiffness.  Skin: Negative for color change and rash.  Neurological: Negative for seizures and syncope.  All other systems reviewed and are negative.    Physical Exam Updated Vital Signs  ED Triage  Vitals  Enc Vitals Group     BP 07/16/18 0940 124/77     Pulse Rate 07/16/18 0940 87     Resp 07/16/18 0940 16     Temp 07/16/18 0940 98 F (36.7 C)     Temp Source 07/16/18 0940 Oral     SpO2 07/16/18 0940 99 %     Weight --      Height --      Head Circumference --      Peak Flow --      Pain Score 07/16/18 0945 9     Pain Loc --      Pain Edu? --      Excl. in [place]? --     Physical Exam Vitals signs and nursing note reviewed.  Constitutional:      General: She is not in acute distress.    Appearance: She is well-developed.  HENT:     Head: Normocephalic and atraumatic.     Mouth/Throat:     Mouth: Mucous membranes are moist.  Eyes:     Extraocular Movements: Extraocular movements intact.     Conjunctiva/sclera: Conjunctivae normal.     Pupils: Pupils are equal, round, and reactive to light.  Neck:     Musculoskeletal: Normal range of motion and neck supple.  Cardiovascular:     Rate and Rhythm: Normal rate and regular rhythm.     Pulses: Normal pulses.     Heart sounds: Normal heart sounds. No murmur.  Pulmonary:     Effort: Pulmonary effort is normal. No respiratory distress.     Breath sounds: Normal  breath sounds.  Abdominal:     General: There is no distension.     Palpations: Abdomen is soft.     Tenderness: There is no abdominal tenderness.  Musculoskeletal: Normal range of motion.        General: No swelling.     Comments: Patient with tenderness to the left shoulder  Skin:    General: Skin is warm and dry.     Capillary Refill: Capillary refill takes less than 2 seconds.     Comments: Prior surgical wound site to left side of the chest with some wound dehiscence that extends into the axilla, no drainage, no obvious fluctuance, warmth  Neurological:     General: No focal deficit present.     Mental Status: She is alert and oriented to person, place, and time.  Psychiatric:        Mood and Affect: Mood normal.      ED Treatments / Results  Labs (all labs ordered are listed, but only abnormal results are displayed) Labs Reviewed  CBC WITH DIFFERENTIAL/PLATELET - Abnormal; Notable for the following components:      Result Value   RBC 3.82 (*)    Platelets 477 (*)    All other components within normal limits  BASIC METABOLIC PANEL - Abnormal; Notable for the following components:   Glucose, Bld 100 (*)    All other components within normal limits    EKG None  Radiology Dg Shoulder Left  Result Date: 07/16/2018 CLINICAL DATA:  Left shoulder pain. EXAM: LEFT SHOULDER - 2+ VIEW COMPARISON:  None. FINDINGS: There is no evidence of fracture or dislocation. There is no evidence of arthropathy or other focal bone abnormality. Soft tissues are unremarkable. IMPRESSION: Negative. Electronically Signed   By: Aletta Edouard M.D.   On: 07/16/2018 10:43   US Breast Ltd Uni Left Inc Axilla  Result  Date: 07/16/2018 CLINICAL DATA:  History of left mastectomy and left breast cancer. Pain. Evaluate for abscess or fluid collection. EXAM: ULTRASOUND OF THE left AXILLA COMPARISON:  08/25/2017. FINDINGS: Within the left axilla at the surgical incision site from previous left mastectomy  there is a large complex hypoechoic area with ill-defined margins measuring 3.8 x 1.2 x 3.8 cm. There are central complex anechoic areas likely representing fluid collections. IMPRESSION: Heterogeneous hypoechoic masslike area in the left axilla is demonstrated with multiple small internal foci of complex fluid. Findings could be secondary to multiple small abscesses. RECOMMENDATION: 1. If a breast abscess, or possible abscess, is demonstrated by ultrasound in the absence of systemic illness, non-intact overlying skin, or other clinical features requiring hospital admission, it is recommended that patient be started on an appropriate course of antibiotics and referred to the [place] the following business morning for possible abscess drainage. Electronically Signed   By: Kerby Moors M.D.   On: 07/16/2018 12:16    Procedures Procedures (including critical care time)  Medications Ordered in ED Medications  cyclobenzaprine (FLEXERIL) tablet 5 mg (5 mg Oral Refused 07/16/18 1138)  fentaNYL (SUBLIMAZE) injection 50 mcg (50 mcg Intravenous Given 07/16/18 1021)  fentaNYL (SUBLIMAZE) injection 50 mcg (50 mcg Intravenous Given 07/16/18 1138)  oxyCODONE (Oxy IR/ROXICODONE) immediate release tablet 5 mg (5 mg Oral Given 07/16/18 1313)     Initial Impression / Assessment and Plan / ED Course  I have reviewed the triage vital signs and the nursing notes.  Pertinent labs & imaging results that were available during my care of the patient were reviewed by me and considered in my medical decision making (see chart for details).     Kim Anthony is a 68 year old female with history of breast cancer status post surgery in early 2000 who presents to the ED with pain in the left axilla.  Patient with unremarkable vitals.  No fever.  Patient has had increased pain from left axilla surgical incision that started about 3 months ago.  Patient has had negative CT imaging recently.  Patient  states that pain is up into the shoulder.  Has overall normal range of motion of the shoulder.  X-ray showed no joint effusion.  No concern for septic joint.  Patient with no surrounding cellulitis.  There is some mild wound dehiscence but no drainage.  No fluctuance.  Overall area is pretty firm likely from scar tissue.  Patient had ultrasound performed of the axilla that showed possible abscess.  Contacted Dr. Brantley Stage with general surgery and he was able to look at the picture of the wound.  Recommends clindamycin, outpatient follow-up in 2 days.  Patient also given referral to breast radiology of [facility] for possible aspiration.  Patient with no leukocytosis, no fever, normal blood pressure.  No concern for systemic infection at this time.  She understands strict return precautions.  Given information for follow-up.  Given IV fentanyl with improvement.  Will prescribe Roxicodone as well for pain.  Discharged in good condition.  This chart was dictated using voice recognition software.  Despite best efforts to proofread,  errors can occur which can change the documentation meaning.   Final Clinical Impressions(s) / ED Diagnoses   Final diagnoses:  Pain  Abscess    ED Discharge Orders         Ordered    oxyCODONE (ROXICODONE) 5 MG immediate release tablet  Every 6 hours PRN     07/16/18 1311  clindamycin (CLEOCIN) 300 MG capsule  3 times daily     07/16/18 1311           [place], DO 07/16/18 1316

## 2018-07-16 NOTE — ED Notes (Signed)
Patient transported to x-ray. ?

## 2018-07-16 NOTE — ED Notes (Signed)
Patient transported to US 

## 2018-07-19 ENCOUNTER — Ambulatory Visit: Payer: Medicare Other | Admitting: Pharmacist

## 2018-07-20 ENCOUNTER — Ambulatory Visit: Payer: Medicare Other

## 2018-07-20 ENCOUNTER — Ambulatory Visit: Payer: Medicare Other | Admitting: Pharmacist

## 2018-07-26 ENCOUNTER — Ambulatory Visit (INDEPENDENT_AMBULATORY_CARE_PROVIDER_SITE_OTHER): Payer: Medicare Other | Admitting: Pharmacist

## 2018-07-26 DIAGNOSIS — I2699 Other pulmonary embolism without acute cor pulmonale: Secondary | ICD-10-CM | POA: Diagnosis not present

## 2018-07-26 DIAGNOSIS — Z952 Presence of prosthetic heart valve: Secondary | ICD-10-CM | POA: Diagnosis not present

## 2018-07-26 DIAGNOSIS — Z5181 Encounter for therapeutic drug level monitoring: Secondary | ICD-10-CM

## 2018-07-26 DIAGNOSIS — Z7901 Long term (current) use of anticoagulants: Secondary | ICD-10-CM

## 2018-07-26 LAB — POCT INR: INR: 2.2 (ref 2.0–3.0)

## 2018-07-26 NOTE — Progress Notes (Signed)
Reinforced importance of consistent dietary intake and effect other changes can have on INR (recent d/c of antibiotic course, possible change in # cigarettes per day). Patient verbalized understanding. Patient prefers to work on consistent vitamin K intake and smoking cessation rather than warfarin dose change today. If subtherapeutic at next visit, will adjust dose.

## 2018-07-26 NOTE — Progress Notes (Signed)
Anticoagulation Management Kim Anthony is a 68 y.o. female who reports to the clinic for monitoring of warfarin treatment.    Indication: PE  Duration: indefinite Supervising physician: Oval Linsey  Anticoagulation Clinic Visit History: Patient does not report signs/symptoms of bleeding or thromboembolism at this time. Other recent changes: Slight increase in vit k foods. Patient reports eating one more meal per week of leafy green vegetables. No medications or lifestyle changes endorsed by patient. Anticoagulation Episode Summary    Current INR goal:   2.5-3.5  TTR:   32.0 % (2.9 y)  Next INR check:   07/26/2018  INR from last check:   2.2! (07/26/2018)  Weekly max warfarin dose:     Target end date:     INR check location:   Anticoagulation Clinic  Preferred lab:     Send INR reminders to:      Indications   Pulmonary embolism (HCC) [I26.99]       Comments:           Allergies  Allergen Reactions  . Acetaminophen-Codeine Itching  . Other Itching and Other (See Comments)    Darvocet  . Tramadol Itching and Nausea And Vomiting   Prior to Admission medications   Medication Sig Start Date End Date Taking? Authorizing Provider  albuterol (PROVENTIL HFA;VENTOLIN HFA) 108 (90 Base) MCG/ACT inhaler Inhale 1-2 puffs into the lungs every 6 (six) hours as needed for wheezing or shortness of breath. Patient taking differently: Inhale 1-2 puffs into the lungs 2 (two) times daily.  12/07/17   Lenore Cordia, MD  albuterol (PROVENTIL) (2.5 MG/3ML) 0.083% nebulizer solution Take 3 mLs (2.5 mg total) by nebulization every 6 (six) hours as needed for wheezing or shortness of breath. Patient not taking: Reported on 07/16/2018 11/26/17   Katherine Roan, MD  Cholecalciferol (VITAMIN D3 PO) Take 1 capsule by mouth 2 (two) times daily.    [provider]  clindamycin (CLEOCIN) 300 MG capsule Take 1 capsule (300 mg total) by mouth 3 (three) times daily for 10 days. 07/16/18 07/26/18   Curatolo, Adam, DO  fluticasone (FLONASE) 50 MCG/ACT nasal spray Place 2 sprays into both nostrils daily. Patient taking differently: Place 1 spray into both nostrils daily.  12/03/16 04/11/26  Jule Ser, DO  furosemide (LASIX) 40 MG tablet Take 1 tablet (40 mg total) by mouth daily. Patient taking differently: Take 40 mg by mouth at bedtime.  04/06/18   Isabelle Course, MD  gabapentin (NEURONTIN) 600 MG tablet Take 600 mg by mouth 3 (three) times daily. 09/23/17   [provider]  HYDROcodone-acetaminophen (NORCO/VICODIN) 5-325 MG tablet Take 1 tablet by mouth every 6 (six) hours as needed. Patient not taking: Reported on 07/16/2018 06/08/18   Davonna Belling, MD  lisinopril (PRINIVIL,ZESTRIL) 10 MG tablet TAKE 1 TABLET BY MOUTH ONCE DAILY Patient taking differently: Take 10 mg by mouth at bedtime.  03/14/18   Oda Kilts, MD  metoprolol succinate (TOPROL-XL) 25 MG 24 hr tablet TAKE 1/2 (ONE-HALF) TABLET BY MOUTH ONCE DAILY Patient taking differently: Take 12.5 mg by mouth at bedtime.  01/29/18   Isabelle Course, MD  oxyCODONE (ROXICODONE) 5 MG immediate release tablet Take 1 tablet (5 mg total) by mouth every 6 (six) hours as needed for up to 10 doses for severe pain. 07/16/18   Curatolo, Adam, DO  potassium chloride SA (K-DUR,KLOR-CON) 20 MEQ tablet Take 20 mEq by mouth 2 (two) times daily.    [provider]  simvastatin (  ZOCOR) 20 MG tablet TAKE 1 TABLET BY MOUTH ONCE DAILY Patient taking differently: Take 20 mg by mouth daily at 6 PM.  03/14/18   Oda Kilts, MD  umeclidinium-vilanterol (ANORO ELLIPTA) 62.5-25 MCG/INH AEPB Inhale 1 puff into the lungs daily. 11/26/17   Katherine Roan, MD  warfarin (COUMADIN) 5 MG tablet Take 1 tablet (5 mg total) by mouth daily. Patient taking differently: Take 5 mg by mouth daily at 6 PM.  07/14/18   Forde Dandy, PharmD   Past Medical History:  Diagnosis Date  . Asthma   . Breast cancer ([place])    Left Breast Cancer   . CHF (congestive heart failure) ([place])   . COPD (chronic obstructive pulmonary disease) ([place])   . Coronary artery disease   . History of mitral valve replacement with mechanical valve   . HLD (hyperlipidemia)   . Hypertension   . Pulmonary embolism [facility])    Social History   Socioeconomic History  . Marital status: Single    Spouse name: Not on file  . Number of children: Not on file  . Years of education: Not on file  . Highest education level: Not on file  Occupational History  . Not on file  Social Needs  . Financial resource strain: Not on file  . Food insecurity:    Worry: Not on file    Inability: Not on file  . Transportation needs:    Medical: Not on file    Non-medical: Not on file  Tobacco Use  . Smoking status: Current Every Day Smoker    Packs/day: 0.50    Types: Cigarettes  . Smokeless tobacco: Never Used  . Tobacco comment: Sometimes less.  Substance and Sexual Activity  . Alcohol use: Yes    Comment: beer  . Drug use: No  . Sexual activity: Not Currently    Birth control/protection: None  Lifestyle  . Physical activity:    Days per week: Not on file    Minutes per session: Not on file  . Stress: Not on file  Relationships  . Social connections:    Talks on phone: Not on file    Gets together: Not on file    Attends religious service: Not on file    Active member of club or organization: Not on file    Attends meetings of clubs or organizations: Not on file    Relationship status: Not on file  Other Topics Concern  . Not on file  Social History Narrative   ** Merged History Encounter **       Family History  Problem Relation Age of Onset  . Heart attack Other   . Breast cancer Maternal Aunt 50    ASSESSMENT Recent Results: The most recent result is correlated with 35 mg per week: Lab Results  Component Value Date   INR 2.2 07/26/2018   INR 4.6 (A) 07/14/2018   INR 3.9 (A) 07/04/2018    Anticoagulation Dosing: Description    Patient has warfarin 5 mg tablets on-hand, quantum sufficient to TAKE one tablet (5mg ) SUNDAYS, MONDAYS, WEDNESDAYS, THURSDAYS and SATURDAYS, and one and one-half tablets on TUESDAYS AND FRIDAYS.     INR today: Subtherapeutic  PLAN Weekly dose was unchanged. There are no Patient Instructions on file for this visit. Patient advised to contact clinic or seek medical attention if signs/symptoms of bleeding or thromboembolism occur.  Patient verbalized understanding by repeating back information and was advised to contact me if further medication-related  questions arise. Patient was also provided an information handout.  Follow-up No follow-ups on file. Return in 2 weeks (on 08/09/2018) for INR follow-up at 9:00am.  15 minutes spent face-to-face with the patient during the encounter. 50% of time spent on education. 50% of time was spent on point of care INR testing, INR interpretation, dose adjustment, patient counseling, and documentation in [email or web address].  Adella Hare, PharmD Candidate

## 2018-07-26 NOTE — Progress Notes (Signed)
Indication: s/p mechanical mitral valve. Duration: Indefinite. INR: Subtherapeutic. Agree with Ms. Montgomery's assessment and plan.

## 2018-07-26 NOTE — Patient Instructions (Signed)
Patient educated about medication as defined in this encounter and verbalized understanding by repeating back instructions provided.   

## 2018-08-01 ENCOUNTER — Other Ambulatory Visit: Payer: Self-pay | Admitting: Surgery

## 2018-08-01 DIAGNOSIS — T8131XA Disruption of external operation (surgical) wound, not elsewhere classified, initial encounter: Secondary | ICD-10-CM | POA: Diagnosis not present

## 2018-08-01 DIAGNOSIS — L7682 Other postprocedural complications of skin and subcutaneous tissue: Secondary | ICD-10-CM | POA: Diagnosis not present

## 2018-08-05 DIAGNOSIS — G5763 Lesion of plantar nerve, bilateral lower limbs: Secondary | ICD-10-CM | POA: Diagnosis not present

## 2018-08-09 ENCOUNTER — Encounter: Payer: Self-pay | Admitting: Plastic Surgery

## 2018-08-09 ENCOUNTER — Ambulatory Visit (INDEPENDENT_AMBULATORY_CARE_PROVIDER_SITE_OTHER): Payer: Medicare Other | Admitting: Plastic Surgery

## 2018-08-09 ENCOUNTER — Ambulatory Visit (INDEPENDENT_AMBULATORY_CARE_PROVIDER_SITE_OTHER): Payer: Medicare Other | Admitting: Pharmacist

## 2018-08-09 VITALS — BP 147/87 | HR 69 | Temp 98.3°F | Ht 65.0 in | Wt 166.0 lb

## 2018-08-09 DIAGNOSIS — Z86711 Personal history of pulmonary embolism: Secondary | ICD-10-CM

## 2018-08-09 DIAGNOSIS — Z5181 Encounter for therapeutic drug level monitoring: Secondary | ICD-10-CM

## 2018-08-09 DIAGNOSIS — Z7901 Long term (current) use of anticoagulants: Secondary | ICD-10-CM

## 2018-08-09 DIAGNOSIS — Z952 Presence of prosthetic heart valve: Secondary | ICD-10-CM

## 2018-08-09 DIAGNOSIS — S41102A Unspecified open wound of left upper arm, initial encounter: Secondary | ICD-10-CM

## 2018-08-09 DIAGNOSIS — Z853 Personal history of malignant neoplasm of breast: Secondary | ICD-10-CM | POA: Diagnosis not present

## 2018-08-09 DIAGNOSIS — I2699 Other pulmonary embolism without acute cor pulmonale: Secondary | ICD-10-CM

## 2018-08-09 LAB — POCT INR: INR: 3.4 — AB (ref 2.0–3.0)

## 2018-08-09 NOTE — Progress Notes (Addendum)
Patient ID: Kim Anthony, female    DOB: [birth year], 68 y.o.   [name]: [id number]   Chief Complaint  Patient presents with  . Advice Only    for wound under (L) arm  . Breast Problem    The patient is a 68 year old female here with her daughter for evaluation of a wound on her left chest.  She was sent by Dr. Ninfa Linden.  She was diagnosed and treated for left breast cancer in 2001 in [place].  She had a left mastectomy with complete axillary dissection and postop radiation.  She was treated with anti-hormonal therapy.  She has a history of pulmonary embolism, CHF, COPD and is treated with Coumadin.  She also has a mechanical heart valve. She had a breakdown in the skin and has gone to the emergency room several times for the pain.  Her ability to abduct is limited as the skin and muscle are very tight.  She had a CT scan in December that was unremarkable.  She had an ultrasound in January that was not concerning for malignancy but showed fluid collection.  She is a current smoker.   Review of Systems  Constitutional: Positive for activity change. Negative for appetite change.  HENT: Negative.   Eyes: Negative.   Respiratory: Negative.   Cardiovascular: Negative.   Gastrointestinal: Negative.   Endocrine: Negative.   Genitourinary: Negative.   Musculoskeletal: Negative.   Skin: Positive for color change and wound.  Neurological: Negative.   Psychiatric/Behavioral: Negative.     Past Medical History:  Diagnosis Date  . Asthma   . Breast cancer ([place])    Left Breast Cancer  . CHF (congestive heart failure) ([place])   . COPD (chronic obstructive pulmonary disease) ([place])   . Coronary artery disease   . History of mitral valve replacement with mechanical valve   . HLD (hyperlipidemia)   . Hypertension   . Pulmonary embolism [facility]     Past Surgical History:  Procedure Laterality Date  . COLONOSCOPY WITH PROPOFOL N/A 06/07/2018   Procedure: COLONOSCOPY WITH PROPOFOL;  Surgeon:  Wilford Corner, MD;  Location: [facility] ENDOSCOPY;  Service: Endoscopy;  Laterality: N/A;  . MASTECTOMY Left 2000s   for breast cancer, also s/p radiation and chemo  . MECHANICAL AORTIC AND MITRAL VALVE REPLACEMENT    . POLYPECTOMY  06/07/2018   Procedure: POLYPECTOMY;  Surgeon: Wilford Corner, MD;  Location: [facility] ENDOSCOPY;  Service: Endoscopy;;      Current Outpatient Medications:  .  albuterol (PROVENTIL HFA;VENTOLIN HFA) 108 (90 Base) MCG/ACT inhaler, Inhale 1-2 puffs into the lungs every 6 (six) hours as needed for wheezing or shortness of breath. (Patient taking differently: Inhale 1-2 puffs into the lungs 2 (two) times daily. ), Disp: 6.7 g, Rfl: 2 .  albuterol (PROVENTIL) (2.5 MG/3ML) 0.083% nebulizer solution, Take 3 mLs (2.5 mg total) by nebulization every 6 (six) hours as needed for wheezing or shortness of breath., Disp: 75 mL, Rfl: 12 .  Cholecalciferol (VITAMIN D3 PO), Take 1 capsule by mouth 2 (two) times daily., Disp: , Rfl:  .  fluticasone (FLONASE) 50 MCG/ACT nasal spray, Place 2 sprays into both nostrils daily. (Patient taking differently: Place 1 spray into both nostrils daily. ), Disp: 16 g, Rfl: 2 .  furosemide (LASIX) 40 MG tablet, Take 1 tablet (40 mg total) by mouth daily. (Patient taking differently: Take 40 mg by mouth at bedtime. ), Disp: 90 tablet, Rfl: 1 .  gabapentin (NEURONTIN) 600 MG tablet, Take  600 mg by mouth 3 (three) times daily., Disp: , Rfl: 6 .  lisinopril (PRINIVIL,ZESTRIL) 10 MG tablet, TAKE 1 TABLET BY MOUTH ONCE DAILY (Patient taking differently: Take 10 mg by mouth at bedtime. ), Disp: 90 tablet, Rfl: 1 .  metoprolol succinate (TOPROL-XL) 25 MG 24 hr tablet, TAKE 1/2 (ONE-HALF) TABLET BY MOUTH ONCE DAILY (Patient taking differently: Take 12.5 mg by mouth at bedtime. ), Disp: 45 tablet, Rfl: 1 .  potassium chloride SA (K-DUR,KLOR-CON) 20 MEQ tablet, Take 20 mEq by mouth 2 (two) times daily., Disp: , Rfl:  .  simvastatin (ZOCOR) 20 MG tablet, TAKE 1 TABLET  BY MOUTH ONCE DAILY (Patient taking differently: Take 20 mg by mouth daily at 6 PM. ), Disp: 90 tablet, Rfl: 1 .  umeclidinium-vilanterol (ANORO ELLIPTA) 62.5-25 MCG/INH AEPB, Inhale 1 puff into the lungs daily., Disp: 1 each, Rfl: 3 .  warfarin (COUMADIN) 5 MG tablet, Take 1 tablet (5 mg total) by mouth daily. (Patient taking differently: Take 5 mg by mouth daily at 6 PM. ), Disp: 30 tablet, Rfl: 3   Objective:   Vitals:   08/09/18 0945  BP: (!) 147/87  Pulse: 69  Temp: 98.3 F (36.8 C)  SpO2: 100%    Physical Exam Constitutional:      Appearance: Normal appearance.  HENT:     Head: Normocephalic and atraumatic.     Mouth/Throat:     Mouth: Mucous membranes are moist.  Eyes:     Extraocular Movements: Extraocular movements intact.     Pupils: Pupils are equal, round, and reactive to light.  Neck:     Musculoskeletal: Normal range of motion.  Cardiovascular:     Rate and Rhythm: Normal rate.  Pulmonary:     Effort: Pulmonary effort is normal.  Chest:    Abdominal:     General: Abdomen is flat. There is no distension.     Tenderness: There is no abdominal tenderness. There is no guarding.  Neurological:     General: No focal deficit present.     Mental Status: She is alert.  Psychiatric:        Mood and Affect: Mood normal.        Thought Content: Thought content normal.        Judgment: Judgment normal.     Assessment & Plan:  Open wound of left axillary region, initial encounter  Hx of breast cancer I have spoken with Dr. Ninfa Linden about the above information and we will work together for resection and reconstruction.  Options were given to the patient.  Skin graft and a latissimus muscle flap.  The patient has chosen a latissimus muscle flap.  I think that this is a better option for long-term result. Plan for latissimus muscle reconstruction in coordination with Dr. Ninfa Linden.  We will likely need to bridge her from her Coumadin to Lovenox prior to surgery due  to her heart valve and history of PE.  [place], DO

## 2018-08-09 NOTE — Progress Notes (Signed)
Anticoagulation Management Kim Anthony is a 68 y.o. female who reports to the clinic for monitoring of warfarin treatment.    Indication: PE, mechanical valve Duration: indefinite Supervising physician: [place] [facility] Visit History: Patient does not report signs/symptoms of bleeding or thromboembolism at this time. Other recent changes: No diet, medications, or lifestyle changes reported. Anticoagulation Episode Summary    Current INR goal:   2.5-3.5  TTR:   32.5 % (3 y)  Next INR check:   08/23/2018  INR from last check:   3.4 (08/09/2018)  Weekly max warfarin dose:     Target end date:     INR check location:   Anticoagulation Clinic  Preferred lab:     Send INR reminders to:      Indications   Pulmonary embolism (HCC) [I26.99]       Comments:           Allergies  Allergen Reactions  . Acetaminophen-Codeine Itching  . Other Itching and Other (See Comments)    Darvocet  . Tramadol Itching and Nausea And Vomiting   Prior to Admission medications   Medication Sig Start Date End Date Taking? Authorizing Provider  albuterol (PROVENTIL HFA;VENTOLIN HFA) 108 (90 Base) MCG/ACT inhaler Inhale 1-2 puffs into the lungs every 6 (six) hours as needed for wheezing or shortness of breath. Patient taking differently: Inhale 1-2 puffs into the lungs 2 (two) times daily.  12/07/17   Lenore Cordia, MD  albuterol (PROVENTIL) (2.5 MG/3ML) 0.083% nebulizer solution Take 3 mLs (2.5 mg total) by nebulization every 6 (six) hours as needed for wheezing or shortness of breath. Patient not taking: Reported on 07/16/2018 11/26/17   Katherine Roan, MD  Cholecalciferol (VITAMIN D3 PO) Take 1 capsule by mouth 2 (two) times daily.    [provider]  fluticasone (FLONASE) 50 MCG/ACT nasal spray Place 2 sprays into both nostrils daily. Patient taking differently: Place 1 spray into both nostrils daily.  12/03/16 04/11/26  Jule Ser, DO  furosemide (LASIX) 40 MG  tablet Take 1 tablet (40 mg total) by mouth daily. Patient taking differently: Take 40 mg by mouth at bedtime.  04/06/18   Isabelle Course, MD  gabapentin (NEURONTIN) 600 MG tablet Take 600 mg by mouth 3 (three) times daily. 09/23/17   [provider]  HYDROcodone-acetaminophen (NORCO/VICODIN) 5-325 MG tablet Take 1 tablet by mouth every 6 (six) hours as needed. Patient not taking: Reported on 07/16/2018 06/08/18   Davonna Belling, MD  lisinopril (PRINIVIL,ZESTRIL) 10 MG tablet TAKE 1 TABLET BY MOUTH ONCE DAILY Patient taking differently: Take 10 mg by mouth at bedtime.  03/14/18   Oda Kilts, MD  metoprolol succinate (TOPROL-XL) 25 MG 24 hr tablet TAKE 1/2 (ONE-HALF) TABLET BY MOUTH ONCE DAILY Patient taking differently: Take 12.5 mg by mouth at bedtime.  01/29/18   Isabelle Course, MD  oxyCODONE (ROXICODONE) 5 MG immediate release tablet Take 1 tablet (5 mg total) by mouth every 6 (six) hours as needed for up to 10 doses for severe pain. 07/16/18   Curatolo, Adam, DO  potassium chloride SA (K-DUR,KLOR-CON) 20 MEQ tablet Take 20 mEq by mouth 2 (two) times daily.    [provider]  simvastatin (ZOCOR) 20 MG tablet TAKE 1 TABLET BY MOUTH ONCE DAILY Patient taking differently: Take 20 mg by mouth daily at 6 PM.  03/14/18   Oda Kilts, MD  umeclidinium-vilanterol (ANORO ELLIPTA) 62.5-25 MCG/INH AEPB Inhale 1 puff into the lungs daily.  11/26/17   Katherine Roan, MD  warfarin (COUMADIN) 5 MG tablet Take 1 tablet (5 mg total) by mouth daily. Patient taking differently: Take 5 mg by mouth daily at 6 PM.  07/14/18   Forde Dandy, PharmD   Past Medical History:  Diagnosis Date  . Asthma   . Breast cancer ([place])    Left Breast Cancer  . CHF (congestive heart failure) ([place])   . COPD (chronic obstructive pulmonary disease) ([place])   . Coronary artery disease   . History of mitral valve replacement with mechanical valve   . HLD (hyperlipidemia)   . Hypertension   .  Pulmonary embolism [facility])    Social History   Socioeconomic History  . Marital status: Single    Spouse name: Not on file  . Number of children: Not on file  . Years of education: Not on file  . Highest education level: Not on file  Occupational History  . Not on file  Social Needs  . Financial resource strain: Not on file  . Food insecurity:    Worry: Not on file    Inability: Not on file  . Transportation needs:    Medical: Not on file    Non-medical: Not on file  Tobacco Use  . Smoking status: Current Every Day Smoker    Packs/day: 0.50    Types: Cigarettes  . Smokeless tobacco: Never Used  . Tobacco comment: Sometimes less.  Substance and Sexual Activity  . Alcohol use: Yes    Comment: beer  . Drug use: No  . Sexual activity: Not Currently    Birth control/protection: None  Lifestyle  . Physical activity:    Days per week: Not on file    Minutes per session: Not on file  . Stress: Not on file  Relationships  . Social connections:    Talks on phone: Not on file    Gets together: Not on file    Attends religious service: Not on file    Active member of club or organization: Not on file    Attends meetings of clubs or organizations: Not on file    Relationship status: Not on file  Other Topics Concern  . Not on file  Social History Narrative   ** Merged History Encounter **       Family History  Problem Relation Age of Onset  . Heart attack Other   . Breast cancer Maternal Aunt 50    ASSESSMENT Recent Results: The most recent result is correlated with 35 mg per week: Lab Results  Component Value Date   INR 3.4 (A) 08/09/2018   INR 2.2 07/26/2018   INR 4.6 (A) 07/14/2018    Anticoagulation Dosing: Description   Patient has warfarin 5 mg tablets on-hand, quantum sufficient to TAKE one tablet (5mg ) SUNDAYS, MONDAYS, WEDNESDAYS, THURSDAYS and SATURDAYS, and one and one-half tablets on TUESDAYS AND FRIDAYS.     INR today: Therapeutic  PLAN Weekly  dose was unchanged.  Patient Instructions  Patient educated about medication as defined in this encounter and verbalized understanding by repeating back instructions provided.   Patient advised to contact clinic or seek medical attention if signs/symptoms of bleeding or thromboembolism occur.  Patient verbalized understanding by repeating back information and was advised to contact me if further medication-related questions arise. Patient was also provided an information handout.  Follow-up Return in 2 weeks (on 08/23/2018) for INR follow-up at 9:00am.  15 minutes spent face-to-face with the patient during the  encounter. 50% of time spent on education, including signs/sx bleeding and clotting, as well as food and drug interactions with warfarin. 50% of time was spent on fingerprick POC INR sample collection,processing, results determination, and documentation in [email or web address]  Adella Hare, PharmD Candidate

## 2018-08-10 ENCOUNTER — Other Ambulatory Visit: Payer: Self-pay | Admitting: Internal Medicine

## 2018-08-10 ENCOUNTER — Encounter: Payer: Medicare Other | Admitting: Internal Medicine

## 2018-08-10 NOTE — Patient Instructions (Signed)
Patient educated about medication as defined in this encounter and verbalized understanding by repeating back instructions provided.   

## 2018-08-10 NOTE — Progress Notes (Signed)
Patient was seen in clinic with Ellen Montgomery, PharmD candidate. I agree with the assessment and plan of care documented.  

## 2018-08-10 NOTE — Progress Notes (Signed)
Indication: s/p mechanical mitral valve. Duration: Indefinite. INR: At target. Agree with Ms. Montgomery/Dr. Kim's assessment and plan.

## 2018-08-19 ENCOUNTER — Other Ambulatory Visit: Payer: Self-pay | Admitting: Surgery

## 2018-08-19 DIAGNOSIS — T8189XA Other complications of procedures, not elsewhere classified, initial encounter: Secondary | ICD-10-CM | POA: Diagnosis not present

## 2018-08-22 ENCOUNTER — Other Ambulatory Visit: Payer: Self-pay | Admitting: Internal Medicine

## 2018-08-23 ENCOUNTER — Ambulatory Visit: Payer: Medicare Other | Admitting: Pharmacist

## 2018-08-25 ENCOUNTER — Ambulatory Visit (INDEPENDENT_AMBULATORY_CARE_PROVIDER_SITE_OTHER): Payer: Medicare Other | Admitting: Pharmacist

## 2018-08-25 ENCOUNTER — Other Ambulatory Visit: Payer: Self-pay | Admitting: Internal Medicine

## 2018-08-25 DIAGNOSIS — Z7901 Long term (current) use of anticoagulants: Secondary | ICD-10-CM | POA: Diagnosis not present

## 2018-08-25 DIAGNOSIS — J449 Chronic obstructive pulmonary disease, unspecified: Secondary | ICD-10-CM

## 2018-08-25 DIAGNOSIS — E876 Hypokalemia: Secondary | ICD-10-CM

## 2018-08-25 DIAGNOSIS — Z5181 Encounter for therapeutic drug level monitoring: Secondary | ICD-10-CM

## 2018-08-25 DIAGNOSIS — I2699 Other pulmonary embolism without acute cor pulmonale: Secondary | ICD-10-CM

## 2018-08-25 DIAGNOSIS — J441 Chronic obstructive pulmonary disease with (acute) exacerbation: Secondary | ICD-10-CM

## 2018-08-25 LAB — POCT INR: INR: 3.2 — AB (ref 2.0–3.0)

## 2018-08-25 MED ORDER — POTASSIUM CHLORIDE CRYS ER 20 MEQ PO TBCR: 40.0000 meq | EXTENDED_RELEASE_TABLET | Freq: Every day | ORAL | 0 refills | Status: DC

## 2018-08-25 NOTE — Progress Notes (Addendum)
INTERNAL MEDICINE TEACHING ATTENDING ADDENDUM - Aldine Contes M.D  Duration- indefinite, Indication- mechanical mitral valve, PE, INR- therapeutic. Agree with pharmacy recommendations as outlined in their note.

## 2018-08-25 NOTE — Progress Notes (Signed)
Anticoagulation Management Kim Anthony is a 68 y.o. female who reports to the clinic for monitoring of warfarin treatment.    Indication: PE  Duration: indefinite Supervising physician: Aldine Contes  Anticoagulation Clinic Visit History: Patient does not report signs/symptoms of bleeding or thromboembolism  Other recent changes: Patient did state that she has had an intake of  Anticoagulation Episode Summary    Current INR goal:   2.5-3.5  TTR:   33.5 % (3 y)  Next INR check:   09/07/2018  INR from last check:   3.2 (08/25/2018)  Weekly max warfarin dose:     Target end date:     INR check location:   Anticoagulation Clinic  Preferred lab:     Send INR reminders to:      Indications   Pulmonary embolism (HCC) [I26.99]       Comments:           Allergies  Allergen Reactions  . Acetaminophen-Codeine Itching  . Other Itching and Other (See Comments)    Darvocet  . Tramadol Itching and Nausea And Vomiting   Prior to Admission medications   Medication Sig Start Date End Date Taking? Authorizing Provider  albuterol (PROVENTIL HFA;VENTOLIN HFA) 108 (90 Base) MCG/ACT inhaler Inhale 1-2 puffs into the lungs every 6 (six) hours as needed for wheezing or shortness of breath. Patient taking differently: Inhale 1-2 puffs into the lungs 2 (two) times daily.  12/07/17   Lenore Cordia, MD  albuterol (PROVENTIL) (2.5 MG/3ML) 0.083% nebulizer solution Take 3 mLs (2.5 mg total) by nebulization every 6 (six) hours as needed for wheezing or shortness of breath. 11/26/17   Katherine Roan, MD  Cholecalciferol (VITAMIN D3 PO) Take 1 capsule by mouth 2 (two) times daily.    [provider]  fluticasone (FLONASE) 50 MCG/ACT nasal spray Place 2 sprays into both nostrils daily. Patient taking differently: Place 1 spray into both nostrils daily.  12/03/16 04/11/26  Jule Ser, DO  furosemide (LASIX) 40 MG tablet Take 1 tablet (40 mg total) by mouth daily. Patient taking differently:  Take 40 mg by mouth at bedtime.  04/06/18   Isabelle Course, MD  gabapentin (NEURONTIN) 600 MG tablet Take 600 mg by mouth 3 (three) times daily. 09/23/17   [provider]  lisinopril (PRINIVIL,ZESTRIL) 10 MG tablet TAKE 1 TABLET BY MOUTH ONCE DAILY Patient taking differently: Take 10 mg by mouth at bedtime.  03/14/18   Oda Kilts, MD  metoprolol succinate (TOPROL-XL) 25 MG 24 hr tablet TAKE 1/2 (ONE-HALF) TABLET BY MOUTH ONCE DAILY 08/10/18   Isabelle Course, MD  potassium chloride SA (K-DUR,KLOR-CON) 20 MEQ tablet Take 2 tablets by mouth once daily 08/22/18   Isabelle Course, MD  simvastatin (ZOCOR) 20 MG tablet TAKE 1 TABLET BY MOUTH ONCE DAILY Patient taking differently: Take 20 mg by mouth daily at 6 PM.  03/14/18   Oda Kilts, MD  umeclidinium-vilanterol (ANORO ELLIPTA) 62.5-25 MCG/INH AEPB Inhale 1 puff into the lungs daily. 11/26/17   Katherine Roan, MD  warfarin (COUMADIN) 5 MG tablet Take 1 tablet (5 mg total) by mouth daily. Patient taking differently: Take 5 mg by mouth daily at 6 PM.  07/14/18   Forde Dandy, PharmD   Past Medical History:  Diagnosis Date  . Asthma   . Breast cancer ([place])    Left Breast Cancer  . CHF (congestive heart failure) ([place])   . COPD (chronic obstructive pulmonary disease) ([place])   .  Coronary artery disease   . History of mitral valve replacement with mechanical valve   . HLD (hyperlipidemia)   . Hypertension   . Pulmonary embolism [facility])    Social History   Socioeconomic History  . Marital status: Single    Spouse name: Not on file  . Number of children: Not on file  . Years of education: Not on file  . Highest education level: Not on file  Occupational History  . Not on file  Social Needs  . Financial resource strain: Not on file  . Food insecurity:    Worry: Not on file    Inability: Not on file  . Transportation needs:    Medical: Not on file    Non-medical: Not on file  Tobacco Use  . Smoking status: Current  Every Day Smoker    Packs/day: 0.50    Types: Cigarettes  . Smokeless tobacco: Never Used  . Tobacco comment: Sometimes less.  Substance and Sexual Activity  . Alcohol use: Yes    Comment: beer  . Drug use: No  . Sexual activity: Not Currently    Birth control/protection: None  Lifestyle  . Physical activity:    Days per week: Not on file    Minutes per session: Not on file  . Stress: Not on file  Relationships  . Social connections:    Talks on phone: Not on file    Gets together: Not on file    Attends religious service: Not on file    Active member of club or organization: Not on file    Attends meetings of clubs or organizations: Not on file    Relationship status: Not on file  Other Topics Concern  . Not on file  Social History Narrative   ** Merged History Encounter **       Family History  Problem Relation Age of Onset  . Heart attack Other   . Breast cancer Maternal Aunt 50    ASSESSMENT Recent Results: The most recent result is correlated with 35 mg per week: Lab Results  Component Value Date   INR 3.2 (A) 08/25/2018   INR 3.4 (A) 08/09/2018   INR 2.2 07/26/2018    Anticoagulation Dosing: Description   Take Warfarin 5mg  tablet by mouth every day     INR today: Therapeutic  PLAN Weekly dose was unchanged. Patient is therapeutic  Patient Instructions  Patient instructed to take medications as defined in the Anti-coagulation Track section of this encounter.  Patient instructed to take today's dose normally.  Patient verbalized understanding of these instructions.   Patient will continue to take warfarin 5mg  po dialy.   Patient advised to contact clinic or seek medical attention if signs/symptoms of bleeding or thromboembolism occur.  Patient verbalized understanding by repeating back information and was advised to contact me if further medication-related questions arise. Patient was also provided an information handout.  Follow-up Return in  13 days (on 09/07/2018) for INR follow-up.  Thank you for allowing pharmacy to be a part of this patient's care.  Tamela Gammon, PharmD 08/25/2018 11:31 AM PGY-1 Pharmacy Resident Direct Phone: [phone] Please check [email or web address] for unit-specific pharmacist phone numbers   15 minutes spent face-to-face with the patient during the encounter. 50% of time spent on education, including signs/sx bleeding and clotting, as well as food and drug interactions with warfarin. 50% of time was spent on fingerprick POC INR sample collection,processing, results determination, and documentation in [email or web address]

## 2018-08-25 NOTE — Patient Instructions (Signed)
Patient instructed to take medications as defined in the Anti-coagulation Track section of this encounter.  Patient instructed to take today's dose normally.  Patient verbalized understanding of these instructions.   Patient will continue to take warfarin 5mg  po dialy.

## 2018-08-31 ENCOUNTER — Other Ambulatory Visit: Payer: Self-pay | Admitting: Internal Medicine

## 2018-09-02 ENCOUNTER — Encounter: Payer: Self-pay | Admitting: Internal Medicine

## 2018-09-07 ENCOUNTER — Other Ambulatory Visit: Payer: Self-pay | Admitting: Pharmacist

## 2018-09-07 ENCOUNTER — Encounter: Payer: Self-pay | Admitting: Pharmacist

## 2018-09-07 ENCOUNTER — Ambulatory Visit: Payer: Medicare Other | Admitting: Pharmacist

## 2018-09-07 DIAGNOSIS — Z952 Presence of prosthetic heart valve: Secondary | ICD-10-CM

## 2018-09-07 DIAGNOSIS — Z7901 Long term (current) use of anticoagulants: Secondary | ICD-10-CM

## 2018-09-07 DIAGNOSIS — I2699 Other pulmonary embolism without acute cor pulmonale: Secondary | ICD-10-CM

## 2018-09-08 ENCOUNTER — Telehealth: Payer: Self-pay

## 2018-09-08 NOTE — Telephone Encounter (Signed)
Requesting to speak with you. Please call back.

## 2018-09-08 NOTE — Telephone Encounter (Signed)
Thanks Hme, I followed up with patient

## 2018-09-15 ENCOUNTER — Telehealth: Payer: Self-pay

## 2018-09-15 NOTE — Telephone Encounter (Signed)
Pt's daughter would like to speak with you. Please call back.

## 2018-09-22 ENCOUNTER — Telehealth: Payer: Self-pay | Admitting: Internal Medicine

## 2018-09-22 NOTE — Telephone Encounter (Signed)
[employer] Requesting PT/INR equipment.  Please call back.

## 2018-09-23 ENCOUNTER — Inpatient Hospital Stay (HOSPITAL_COMMUNITY): Admission: RE | Admit: 2018-09-23 | Payer: Medicare Other | Source: Ambulatory Visit

## 2018-09-29 ENCOUNTER — Other Ambulatory Visit: Payer: Self-pay

## 2018-09-29 ENCOUNTER — Ambulatory Visit (INDEPENDENT_AMBULATORY_CARE_PROVIDER_SITE_OTHER): Payer: Medicare Other | Admitting: Internal Medicine

## 2018-09-29 DIAGNOSIS — Z7951 Long term (current) use of inhaled steroids: Secondary | ICD-10-CM | POA: Diagnosis not present

## 2018-09-29 DIAGNOSIS — Z952 Presence of prosthetic heart valve: Secondary | ICD-10-CM | POA: Diagnosis not present

## 2018-09-29 DIAGNOSIS — I5042 Chronic combined systolic (congestive) and diastolic (congestive) heart failure: Secondary | ICD-10-CM | POA: Diagnosis not present

## 2018-09-29 DIAGNOSIS — M545 Low back pain: Secondary | ICD-10-CM | POA: Diagnosis not present

## 2018-09-29 DIAGNOSIS — J449 Chronic obstructive pulmonary disease, unspecified: Secondary | ICD-10-CM

## 2018-09-29 DIAGNOSIS — Z720 Tobacco use: Secondary | ICD-10-CM | POA: Diagnosis not present

## 2018-09-29 DIAGNOSIS — G8929 Other chronic pain: Secondary | ICD-10-CM | POA: Diagnosis not present

## 2018-09-29 DIAGNOSIS — Z79899 Other long term (current) drug therapy: Secondary | ICD-10-CM

## 2018-09-29 DIAGNOSIS — Z7901 Long term (current) use of anticoagulants: Secondary | ICD-10-CM | POA: Diagnosis not present

## 2018-09-29 DIAGNOSIS — M5441 Lumbago with sciatica, right side: Principal | ICD-10-CM

## 2018-09-29 DIAGNOSIS — F17200 Nicotine dependence, unspecified, uncomplicated: Secondary | ICD-10-CM

## 2018-09-29 MED ORDER — GABAPENTIN 600 MG PO TABS: 600.0000 mg | ORAL_TABLET | Freq: Two times a day (BID) | ORAL | 4 refills | Status: DC

## 2018-09-29 NOTE — Assessment & Plan Note (Signed)
>>  ASSESSMENT AND PLAN FOR BIVENTRICULAR HEART FAILURE WITH REDUCED LEFT VENTRICULAR FUNCTION (HCC) WRITTEN ON 09/29/2018 11:29 AM BY VOGEL, Becky Sax, MD  Well controlled. Denies sob, weight gain, leg swelling, doe, orthopnea. Taking lasix 40mg  daily.  - continue lasix

## 2018-09-29 NOTE — Assessment & Plan Note (Signed)
Well controlled. Denies sob, weight gain, leg swelling, doe, orthopnea. Taking lasix 40mg  daily.  - continue lasix

## 2018-09-29 NOTE — Assessment & Plan Note (Signed)
>>  ASSESSMENT AND PLAN FOR HFREF (HEART FAILURE WITH REDUCED EJECTION FRACTION) (HCC) WRITTEN ON 09/14/2023 11:44 AM BY Kentrail Shew, DO   >>ASSESSMENT AND PLAN FOR CHRONIC HFREF (HEART FAILURE WITH REDUCED EJECTION FRACTION) (HCC) WRITTEN ON 09/14/2023 11:43 AM BY Giovannie Scerbo, DO   >>ASSESSMENT AND PLAN FOR CHF (CONGESTIVE HEART FAILURE) (HCC) WRITTEN ON 09/14/2023 11:43 AM BY Kristien Salatino, DO   >>ASSESSMENT AND PLAN FOR BIVENTRICULAR HEART FAILURE WITH REDUCED LEFT VENTRICULAR FUNCTION (HCC) WRITTEN ON 09/29/2018 11:29 AM BY VOGEL, Becky Sax, MD  Well controlled. Denies sob, weight gain, leg swelling, doe, orthopnea. Taking lasix 40mg  daily.  - continue lasix

## 2018-09-29 NOTE — Assessment & Plan Note (Signed)
Denies sob. Well controlled on anoro ellipta and prn albuterol

## 2018-09-29 NOTE — Progress Notes (Signed)
Internal Medicine Clinic Attending  Case discussed with Dr. Donne Hazel at the time of the visit.  We reviewed the resident's history and telephone coversation and pertinent patient test results.  I agree with the assessment, diagnosis, and plan of care documented in the resident's note.

## 2018-09-29 NOTE — Progress Notes (Signed)
  [facility] Internal Medicine Residency Telephone Encounter Continuity Care Appointment  HPI:   This telephone encounter was created for Ms. [name] on 09/29/2018 for the following purpose/cc back pain.   Past Medical History:  Past Medical History:  Diagnosis Date  . Asthma   . Breast cancer ([place])    Left Breast Cancer  . CHF (congestive heart failure) ([place])   . COPD (chronic obstructive pulmonary disease) ([place])   . Coronary artery disease   . History of mitral valve replacement with mechanical valve   . HLD (hyperlipidemia)   . Hypertension   . Pulmonary embolism (HCC)       ROS:   Denies chest pain, sob, cough, fevers   Assessment / Plan / Recommendations:   Please see A&P under problem oriented charting for assessment of the patient's acute and chronic medical conditions.   As always, pt is advised that if symptoms worsen or new symptoms arise, they should go to an urgent care facility or to to ER for further evaluation.   Consent and Medical Decision Making:   Patient discussed with Dr. Daryll Drown  This is a telephone encounter between Pola Furno and Isabelle Course on 09/29/2018 for back pain and f/u of copd and chf. The visit was conducted with the patient located at home and Isabelle Course at [facility]. The patient's identity was confirmed using their DOB and current address. The patient has consented to being evaluated through a telephone encounter and understands the associated risks (an examination cannot be done and the patient may need to come in for an appointment) / benefits (allows the patient to remain at home, decreasing exposure to coronavirus). I personally spent 10 minutes on medical discussion.

## 2018-09-29 NOTE — Assessment & Plan Note (Signed)
>>  ASSESSMENT AND PLAN FOR CHF (CONGESTIVE HEART FAILURE) (HCC) WRITTEN ON 09/14/2023 11:43 AM BY Silver Achey, DO   >>ASSESSMENT AND PLAN FOR BIVENTRICULAR HEART FAILURE WITH REDUCED LEFT VENTRICULAR FUNCTION (HCC) WRITTEN ON 09/29/2018 11:29 AM BY VOGEL, Becky Sax, MD  Well controlled. Denies sob, weight gain, leg swelling, doe, orthopnea. Taking lasix 40mg  daily.  - continue lasix

## 2018-09-29 NOTE — Assessment & Plan Note (Signed)
Requesting gabapentin prescription. Endorsing issues with right sided low back pain extending to her foot. Has been on gabapentin in the past which helped but prescription ran out. She has been taking ibuprofen which helps but was told to not take that since she is on warfarin. She has tried muscle relaxers in the past but they make he too loopy.  - prescribe gabapentin 600mg  bid

## 2018-09-29 NOTE — Assessment & Plan Note (Signed)
Taking warfarin 5mg  daily. INR last checked one month ago 3.2. States that they were trying to get her a home INR monitor but she hasn't heard anything about this recently. Denies bleeding, dizziness or light headedness - continue warfarin 5mg  qd - message Dr. Maudie Mercury about home INR machine

## 2018-09-29 NOTE — Assessment & Plan Note (Signed)
>>  ASSESSMENT AND PLAN FOR CHRONIC HFREF (HEART FAILURE WITH REDUCED EJECTION FRACTION) (HCC) WRITTEN ON 09/14/2023 11:43 AM BY Bettejane Leavens, DO   >>ASSESSMENT AND PLAN FOR CHF (CONGESTIVE HEART FAILURE) (HCC) WRITTEN ON 09/14/2023 11:43 AM BY Abel Ra, DO   >>ASSESSMENT AND PLAN FOR BIVENTRICULAR HEART FAILURE WITH REDUCED LEFT VENTRICULAR FUNCTION (HCC) WRITTEN ON 09/29/2018 11:29 AM BY VOGEL, Becky Sax, MD  Well controlled. Denies sob, weight gain, leg swelling, doe, orthopnea. Taking lasix 40mg  daily.  - continue lasix

## 2018-09-29 NOTE — Assessment & Plan Note (Signed)
Smoking 4 cigarettes/day. Using nicotine gum. Counseled on cessation

## 2018-09-29 NOTE — Addendum Note (Signed)
Addended by: Gilles Chiquito B on: 09/29/2018 01:36 PM   Modules accepted: Level of Service

## 2018-10-03 ENCOUNTER — Telehealth: Payer: Self-pay | Admitting: Pharmacist

## 2018-10-03 ENCOUNTER — Ambulatory Visit: Admit: 2018-10-03 | Payer: Medicare Other | Admitting: Surgery

## 2018-10-03 SURGERY — DEBRIDEMENT, WOUND
Anesthesia: General | Laterality: Left

## 2018-10-03 NOTE — Progress Notes (Signed)
Called patient to check on status of INR. Patient states home INR company did not respond to request. Reminded patient she can report to [facility] for INR as well and provided [facility] [address], [place], [place] [address]. The phone number for this location is [phone]. Patient verbalized understanding and states she plans to go to [facility] sometime this wee. Will follow up with patient for INR result/warfarin management per PCP.

## 2018-10-04 ENCOUNTER — Other Ambulatory Visit: Payer: Self-pay | Admitting: Surgery

## 2018-10-04 DIAGNOSIS — Z853 Personal history of malignant neoplasm of breast: Secondary | ICD-10-CM | POA: Diagnosis not present

## 2018-10-04 DIAGNOSIS — L7682 Other postprocedural complications of skin and subcutaneous tissue: Secondary | ICD-10-CM | POA: Diagnosis not present

## 2018-10-04 DIAGNOSIS — T8131XA Disruption of external operation (surgical) wound, not elsewhere classified, initial encounter: Secondary | ICD-10-CM | POA: Diagnosis not present

## 2018-10-05 ENCOUNTER — Other Ambulatory Visit: Payer: Self-pay | Admitting: Surgery

## 2018-10-05 DIAGNOSIS — T8189XA Other complications of procedures, not elsewhere classified, initial encounter: Secondary | ICD-10-CM

## 2018-10-07 ENCOUNTER — Ambulatory Visit
Admission: RE | Admit: 2018-10-07 | Discharge: 2018-10-07 | Disposition: A | Discharge status: Home / Self Care | Payer: Medicare Other | Source: Ambulatory Visit | Attending: Surgery | Admitting: Surgery

## 2018-10-07 ENCOUNTER — Other Ambulatory Visit: Payer: Self-pay

## 2018-10-07 DIAGNOSIS — T8189XA Other complications of procedures, not elsewhere classified, initial encounter: Secondary | ICD-10-CM | POA: Diagnosis not present

## 2018-10-07 DIAGNOSIS — J439 Emphysema, unspecified: Secondary | ICD-10-CM | POA: Diagnosis not present

## 2018-10-07 MED ORDER — IOPAMIDOL (ISOVUE-300) INJECTION 61%
75.0000 mL | Freq: Once | INTRAVENOUS | Status: AC | PRN
  Administered 2018-10-07: 16:00:00 75 mL via INTRAVENOUS

## 2018-10-10 ENCOUNTER — Other Ambulatory Visit: Payer: Self-pay | Admitting: Internal Medicine

## 2018-10-12 ENCOUNTER — Telehealth: Payer: Self-pay | Admitting: *Deleted

## 2018-10-12 DIAGNOSIS — Z7901 Long term (current) use of anticoagulants: Secondary | ICD-10-CM | POA: Diagnosis not present

## 2018-10-12 DIAGNOSIS — Z952 Presence of prosthetic heart valve: Secondary | ICD-10-CM | POA: Diagnosis not present

## 2018-10-12 DIAGNOSIS — I2782 Chronic pulmonary embolism: Secondary | ICD-10-CM | POA: Diagnosis not present

## 2018-10-12 NOTE — Telephone Encounter (Signed)
Received fax from [facility] self testing service. Date 4/22 @ 1357       PT 2.2 Thanks

## 2018-10-13 ENCOUNTER — Telehealth: Payer: Self-pay | Admitting: Pharmacist

## 2018-10-13 NOTE — Telephone Encounter (Signed)
I will call her and advise that she continues upon the same warfarin dosage regimen that has provided this INR.

## 2018-10-13 NOTE — Telephone Encounter (Signed)
Patient performing PST FS POC INRs. Reports value of 2.2 on 35mg  warfarin/wk. Adivsed her to INCREASE weekly dose by 12% to 40mg /wk (as 1 & 1/2 x 5mg  on Mondays/Thursdays; 5mg  all other days. Denies any symptoms of bleeding. Adequate supply of warfarin. Patient will REPEAT patient self-testing, fingerstick point of care INR test in 2 weeks on 27-Oct-2018. Advised her to call [facility] with any symptoms or signs of bleeding, if she needs refills, is started on any new medications, etc.

## 2018-10-14 ENCOUNTER — Telehealth: Payer: Self-pay | Admitting: Internal Medicine

## 2018-10-14 NOTE — Telephone Encounter (Signed)
[employer] makes the Accu chek meters. However, this patient does not appear to have diabetes. I suggest we ask Kim Anthony to clarify her message.

## 2018-10-14 NOTE — Telephone Encounter (Signed)
Reviewed Thanks DrG 

## 2018-10-14 NOTE — Telephone Encounter (Signed)
Kim Anthony called from [place] to say she receive the diabetic meters

## 2018-10-20 ENCOUNTER — Telehealth: Payer: Self-pay | Admitting: Pharmacist

## 2018-10-20 ENCOUNTER — Telehealth: Payer: Self-pay | Admitting: *Deleted

## 2018-10-20 ENCOUNTER — Other Ambulatory Visit: Payer: Self-pay | Admitting: Surgery

## 2018-10-20 NOTE — Telephone Encounter (Signed)
Received fax from [facility] self testing service.   Test date 10/20/18 @ 1054 AM    INR - 2.6 Thanks

## 2018-10-20 NOTE — Telephone Encounter (Signed)
PST FS POC PT/INR made available to me from Triage Nurse. INR = 2.6 (target goal shows 2.5 - 3.5). Will INCREASE from 40mg  warfarin/wk to 42.5mg  warfarin/wk as: 1&1/2 x 5mg  (7.5mg  dose) MWF; 1x5mg  (5mg  dose) all other days. Repat PST FS POC INR in 1 wk on 7- MAY-2020. Patient states to me she has had death in family and will be traveling to [place] and requested I submit a refill authorization for her gabapentin. Unfortunately, my prescriptive formulary as a CPP in [place] is limited to anticoagulant drugs. I indicated to the patient I would apprise the Triage Nurse/her PCP.

## 2018-10-20 NOTE — Telephone Encounter (Signed)
I will call her. Thank you.

## 2018-10-21 ENCOUNTER — Other Ambulatory Visit: Payer: Self-pay | Admitting: Internal Medicine

## 2018-10-21 DIAGNOSIS — M5441 Lumbago with sciatica, right side: Principal | ICD-10-CM

## 2018-10-21 DIAGNOSIS — G8929 Other chronic pain: Secondary | ICD-10-CM

## 2018-10-21 NOTE — Telephone Encounter (Signed)
Thanks Dr. Elie Confer. We'll take care of it.   I've included the refill RN pool on this thread. It looks like a prescribed her gabapentin at the beginning of April and she should have 4 refills at the pharmacy already. Let me know if this isn't the case. Thank you!

## 2018-10-22 NOTE — Telephone Encounter (Signed)
Reviewed Thanks DrG 

## 2018-10-24 ENCOUNTER — Telehealth: Payer: Self-pay | Admitting: *Deleted

## 2018-10-24 NOTE — Telephone Encounter (Signed)
Received fax from [facility] with result of INR taken 10/12/2018  INR 2.2  This, and subsequent INR results have already been addressed by Dr. Elie Confer. Hubbard Hartshorn, RN, BSN

## 2018-10-28 ENCOUNTER — Other Ambulatory Visit: Payer: Self-pay | Admitting: Internal Medicine

## 2018-10-28 ENCOUNTER — Telehealth (INDEPENDENT_AMBULATORY_CARE_PROVIDER_SITE_OTHER): Payer: Medicare Other | Admitting: Pharmacist

## 2018-10-28 DIAGNOSIS — Z5181 Encounter for therapeutic drug level monitoring: Secondary | ICD-10-CM

## 2018-10-28 DIAGNOSIS — Z7901 Long term (current) use of anticoagulants: Secondary | ICD-10-CM

## 2018-10-28 DIAGNOSIS — I2699 Other pulmonary embolism without acute cor pulmonale: Secondary | ICD-10-CM

## 2018-10-28 LAB — POCT INR: INR: 5 — AB (ref 2.0–3.0)

## 2018-10-28 NOTE — Progress Notes (Signed)
Anticoagulation Management Kim Anthony is a 68 y.o. female who reports to the clinic for monitoring of warfarin treatment.    Indication: PE, mechanical valve Duration: indefinite Supervising physician: Murriel Hopper  Patient reports no symptoms of bleeding or of concern. She reports decreased dietary vitamin k intake the past week and had also increased warfarin dose from 7.5 mg twice weekly to 7.5 mg 3 times weekly (5 mg all other days).  Anticoagulation Episode Summary    Current INR goal:   2.5-3.5  TTR:   32.5 % (3.2 y)  Next INR check:   11/04/2018  INR from last check:   5.0! (10/28/2018)  Weekly max warfarin dose:     Target end date:     INR check location:   Anticoagulation Clinic  Preferred lab:     Send INR reminders to:      Indications   Pulmonary embolism (HCC) [I26.99]       Comments:          Allergies  Allergen Reactions  . Acetaminophen-Codeine Itching  . Other Itching and Other (See Comments)    Darvocet  . Tramadol Itching and Nausea And Vomiting   Medication Sig  albuterol (PROVENTIL HFA;VENTOLIN HFA) 108 (90 Base) MCG/ACT inhaler Inhale 1-2 puffs into the lungs every 6 (six) hours as needed for wheezing or shortness of breath.  albuterol (PROVENTIL) (2.5 MG/3ML) 0.083% nebulizer solution Take 3 mLs (2.5 mg total) by nebulization every 6 (six) hours as needed for wheezing or shortness of breath. Patient not taking: Reported on 09/13/2018  ANORO ELLIPTA 62.5-25 MCG/INH AEPB Inhale 1 puff by mouth once daily Patient taking differently: Inhale 1 puff into the lungs daily.   fluticasone (FLONASE) 50 MCG/ACT nasal spray Place 2 sprays into both nostrils daily. Patient taking differently: Place 1 spray into both nostrils daily as needed for allergies.   furosemide (LASIX) 40 MG tablet TAKE 1 TABLET BY MOUTH ONCE DAILY Patient taking differently: Take 40 mg by mouth daily.   gabapentin (NEURONTIN) 600 MG tablet Take 1 tablet (600 mg total) by mouth 2 (two)  times daily.  lisinopril (PRINIVIL,ZESTRIL) 10 MG tablet TAKE 1 TABLET BY MOUTH ONCE DAILY Patient taking differently: Take 10 mg by mouth at bedtime.   metoprolol succinate (TOPROL-XL) 25 MG 24 hr tablet TAKE 1/2 (ONE-HALF) TABLET BY MOUTH ONCE DAILY Patient taking differently: Take 12.5 mg by mouth daily.   Multiple Minerals-Vitamins (CITRACAL PLUS) TABS Take 1 tablet by mouth 2 (two) times daily.  potassium chloride SA (K-DUR,KLOR-CON) 20 MEQ tablet Take 2 tablets (40 mEq total) by mouth daily. Patient taking differently: Take 20 mEq by mouth 2 (two) times daily.   simvastatin (ZOCOR) 20 MG tablet Take 1 tablet by mouth once daily  warfarin (COUMADIN) 5 MG tablet Take 1 tablet (5 mg total) by mouth daily. Patient taking differently: Take 5 mg by mouth daily at 6 PM.    Past Medical History:  Diagnosis Date  . Asthma   . Breast cancer ([place])    Left Breast Cancer  . CHF (congestive heart failure) ([place])   . COPD (chronic obstructive pulmonary disease) ([place])   . Coronary artery disease   . History of mitral valve replacement with mechanical valve   . HLD (hyperlipidemia)   . Hypertension   . Pulmonary embolism [facility])    Social History   Socioeconomic History  . Marital status: Single    Spouse name: Not on file  . Number of children: Not on file  .  Years of education: Not on file  . Highest education level: Not on file  Occupational History  . Not on file  Social Needs  . Financial resource strain: Not on file  . Food insecurity:    Worry: Not on file    Inability: Not on file  . Transportation needs:    Medical: Not on file    Non-medical: Not on file  Tobacco Use  . Smoking status: Current Every Day Smoker    Packs/day: 0.50    Types: Cigarettes  . Smokeless tobacco: Never Used  . Tobacco comment: Sometimes less.  Substance and Sexual Activity  . Alcohol use: Yes    Comment: beer  . Drug use: No  . Sexual activity: Not Currently    Birth control/protection: None   Lifestyle  . Physical activity:    Days per week: Not on file    Minutes per session: Not on file  . Stress: Not on file  Relationships  . Social connections:    Talks on phone: Not on file    Gets together: Not on file    Attends religious service: Not on file    Active member of club or organization: Not on file    Attends meetings of clubs or organizations: Not on file    Relationship status: Not on file  Other Topics Concern  . Not on file  Social History Narrative   ** Merged History Encounter **       Family History  Problem Relation Age of Onset  . Heart attack Other   . Breast cancer Maternal Aunt 50   ASSESSMENT Lab Results  Component Value Date   INR 5.0 (A) 10/28/2018   INR 3.2 (A) 08/25/2018   INR 3.4 (A) 08/09/2018   Anticoagulation INR today: Supratherapeutic  PLAN Weekly dose was decreased to 7.5 mg twice weekly (5 mg all other days)  Patient Instructions  Patient educated about medication as defined in this encounter and verbalized understanding by repeating back instructions provided.   Patient advised to contact clinic or seek medical attention if signs/symptoms of bleeding or thromboembolism occur.  Patient verbalized understanding by repeating back information and was advised to contact me if further medication-related questions arise. Patient was also provided an information handout.  Follow-up Return in about 1 week (around 11/04/2018).  [name]

## 2018-10-28 NOTE — Patient Instructions (Signed)
Patient educated about medication as defined in this encounter and verbalized understanding by repeating back instructions provided.   

## 2018-10-31 LAB — PROTIME-INR

## 2018-11-04 ENCOUNTER — Telehealth: Payer: Self-pay | Admitting: *Deleted

## 2018-11-04 ENCOUNTER — Telehealth: Payer: Self-pay | Admitting: Pharmacy Technician

## 2018-11-04 ENCOUNTER — Ambulatory Visit: Payer: Self-pay | Admitting: Pharmacy Technician

## 2018-11-04 DIAGNOSIS — Z7901 Long term (current) use of anticoagulants: Secondary | ICD-10-CM | POA: Diagnosis not present

## 2018-11-04 DIAGNOSIS — I2782 Chronic pulmonary embolism: Secondary | ICD-10-CM | POA: Diagnosis not present

## 2018-11-04 DIAGNOSIS — I2699 Other pulmonary embolism without acute cor pulmonale: Secondary | ICD-10-CM

## 2018-11-04 DIAGNOSIS — Z952 Presence of prosthetic heart valve: Secondary | ICD-10-CM | POA: Diagnosis not present

## 2018-11-04 LAB — POCT INR: INR: 4.8 — AB (ref 2.0–3.0)

## 2018-11-04 NOTE — Telephone Encounter (Signed)
Result given to [name].

## 2018-11-04 NOTE — Telephone Encounter (Signed)
Anticoagulation Management Kim Anthony is a 68 y.o. female who is contacted for monitoring of warfarin treatment.  Identity verified using 2 patient identifiers.  Indication: PE and mechanical valve  Duration: indefinite Supervising physician: [facility] Visit History: - Patient does not report signs/symptoms of bleeding or thromboembolism.  - Missed doses: skipped warfarin dose 5/8 as directed for supratherapeutic INR - Other medication changes: none - Dietary changes: Patient reports significantly decreased intake of vitamin K-rich foods like greens. She states that she "hasn't been eating my greens like I'm supposed to, but I'm going to start now."    Allergies  Allergen Reactions  . Acetaminophen-Codeine Itching  . Other Itching and Other (See Comments)    Darvocet  . Tramadol Itching and Nausea And Vomiting   Prior to Admission medications   Medication Sig Start Date End Date Taking? Authorizing Provider  albuterol (PROVENTIL HFA;VENTOLIN HFA) 108 (90 Base) MCG/ACT inhaler Inhale 1-2 puffs into the lungs every 6 (six) hours as needed for wheezing or shortness of breath. 12/07/17   Lenore Cordia, MD  albuterol (PROVENTIL) (2.5 MG/3ML) 0.083% nebulizer solution Take 3 mLs (2.5 mg total) by nebulization every 6 (six) hours as needed for wheezing or shortness of breath. Patient not taking: Reported on 09/13/2018 11/26/17   Katherine Roan, MD  [employer] ELLIPTA 62.5-25 MCG/INH AEPB Inhale 1 puff by mouth once daily Patient taking differently: Inhale 1 puff into the lungs daily.  08/25/18   Isabelle Course, MD  fluticasone (FLONASE) 50 MCG/ACT nasal spray Place 2 sprays into both nostrils daily. Patient taking differently: Place 1 spray into both nostrils daily as needed for allergies.  12/03/16 04/11/26  Jule Ser, DO  furosemide (LASIX) 40 MG tablet TAKE 1 TABLET BY MOUTH ONCE DAILY Patient taking differently: Take 40 mg by mouth daily.  08/31/18   Isabelle Course, MD  gabapentin (NEURONTIN) 600 MG tablet Take 1 tablet (600 mg total) by mouth 2 (two) times daily. 09/29/18   Isabelle Course, MD  lisinopril (ZESTRIL) 10 MG tablet Take 1 tablet (10 mg total) by mouth at bedtime. 10/31/18   Isabelle Course, MD  metoprolol succinate (TOPROL-XL) 25 MG 24 hr tablet TAKE 1/2 (ONE-HALF) TABLET BY MOUTH ONCE DAILY Patient taking differently: Take 12.5 mg by mouth daily.  08/10/18   Isabelle Course, MD  Multiple Minerals-Vitamins (CITRACAL PLUS) TABS Take 1 tablet by mouth 2 (two) times daily.    [provider]  potassium chloride SA (K-DUR,KLOR-CON) 20 MEQ tablet Take 2 tablets (40 mEq total) by mouth daily. Patient taking differently: Take 20 mEq by mouth 2 (two) times daily.  08/25/18   Isabelle Course, MD  simvastatin (ZOCOR) 20 MG tablet Take 1 tablet by mouth once daily 10/10/18   Annia Belt, MD  warfarin (COUMADIN) 5 MG tablet Take 1 tablet (5 mg total) by mouth daily. Patient taking differently: Take 5 mg by mouth daily at 6 PM.  07/14/18   Forde Dandy, PharmD   Past Medical History:  Diagnosis Date  . Asthma   . Breast cancer ([place])    Left Breast Cancer  . CHF (congestive heart failure) ([place])   . COPD (chronic obstructive pulmonary disease) ([place])   . Coronary artery disease   . History of mitral valve replacement with mechanical valve   . HLD (hyperlipidemia)   . Hypertension   . Pulmonary embolism [facility])    Social History   Socioeconomic History  .  Marital status: Single    Spouse name: Not on file  . Number of children: Not on file  . Years of education: Not on file  . Highest education level: Not on file  Occupational History  . Not on file  Social Needs  . Financial resource strain: Not on file  . Food insecurity:    Worry: Not on file    Inability: Not on file  . Transportation needs:    Medical: Not on file    Non-medical: Not on file  Tobacco Use  . Smoking status: Current Every Day Smoker    Packs/day: 0.50     Types: Cigarettes  . Smokeless tobacco: Never Used  . Tobacco comment: Sometimes less.  Substance and Sexual Activity  . Alcohol use: Yes    Comment: beer  . Drug use: No  . Sexual activity: Not Currently    Birth control/protection: None  Lifestyle  . Physical activity:    Days per week: Not on file    Minutes per session: Not on file  . Stress: Not on file  Relationships  . Social connections:    Talks on phone: Not on file    Gets together: Not on file    Attends religious service: Not on file    Active member of club or organization: Not on file    Attends meetings of clubs or organizations: Not on file    Relationship status: Not on file  Other Topics Concern  . Not on file  Social History Narrative   ** Merged History Encounter **       Family History  Problem Relation Age of Onset  . Heart attack Other   . Breast cancer Maternal Aunt 50    ASSESSMENT Recent Results: The most recent result is correlated with 40 mg per week: Lab Results  Component Value Date   INR 4.8 (A) 11/04/2018   INR 5.0 (A) 10/28/2018   INR 3.2 (A) 08/25/2018     INR today: Supratherapeutic at 4.8 today despite missed dose and dose decrease last week. Patient states she has not been eating greens but will start today.   PLAN Weekly dose was decreased to 37.5 mg per week and patient advised to skip dose today. Patient advised to increase dietary intake of greens back up to her baseline amount and then eat a consistent amount every week. I asked if this would be an issue since it may be harder to get fresh vegetables with the travel restrictions in place because of COVID, but the patient assured me it would not be a problem for her.   Patient advised to contact clinic or seek medical attention if signs/symptoms of bleeding or thromboembolism occur. Patient verbalized understanding by repeating back information and was advised to contact me if further medication-related questions arise.    Follow-up Will f/u next week on 5/22 for reassessment  Brendolyn Patty, PharmD PGY1 Pharmacy Resident Phone [phone]  11/04/2018   11:19 AM

## 2018-11-04 NOTE — Telephone Encounter (Signed)
Received fax from [facility] PT/INR Self Testing Service   11/04/18 @ 0849 AM - result 4.8

## 2018-11-09 ENCOUNTER — Other Ambulatory Visit: Payer: Self-pay | Admitting: Surgery

## 2018-11-09 DIAGNOSIS — T8131XS Disruption of external operation (surgical) wound, not elsewhere classified, sequela: Secondary | ICD-10-CM | POA: Diagnosis not present

## 2018-11-09 DIAGNOSIS — L7682 Other postprocedural complications of skin and subcutaneous tissue: Secondary | ICD-10-CM | POA: Diagnosis not present

## 2018-11-11 ENCOUNTER — Telehealth: Payer: Self-pay | Admitting: Pharmacist

## 2018-11-11 NOTE — Telephone Encounter (Signed)
Spoke with patient. INR = 3.7 (goal 2.5 - 3.5) on 37.5mg  warfarin/wk. Instructed to DECREASE to 35mg  warfarin/wk. Repeat PST, FS, POC INR 18-Nov-2018. No symptoms/signs of bleeding. Has adequate supply of warfarin.

## 2018-11-18 ENCOUNTER — Telehealth: Payer: Self-pay | Admitting: *Deleted

## 2018-11-18 NOTE — Telephone Encounter (Signed)
Received INR result from [facility] PT/INR Self Testing Service   11/18/18 INR - 1.9 Thanks

## 2018-11-21 ENCOUNTER — Telehealth: Payer: Self-pay | Admitting: Pharmacist

## 2018-11-21 NOTE — Telephone Encounter (Signed)
Patient was called and discussed with her the INR 1.9 from 19-Dec-2018 made available to me today. We increased dose from 35mg  warfarin/wk to 40mg  warfarin/wk. Repeat PST FS POC INR on 25-Nov-2018. Patient denies any signs or symptoms of bleeding or embolic event(s). Has adequate supply of warfarin on-hand she states.

## 2018-11-21 NOTE — Telephone Encounter (Signed)
I will call patient and adjust from 35mg  wafarin/wk to 40mg  warfarin/wk, repeat INR in 1 week.

## 2018-11-25 ENCOUNTER — Telehealth: Payer: Self-pay | Admitting: Pharmacist

## 2018-11-25 NOTE — Telephone Encounter (Signed)
Spoke with patient. PST POC FS INR = 1.9 on 40mg  warfarin/wk. Will INCREASE to 47.5mg  warfarin/wk. Repeat INR 02-Dec-2018. Advised to call clinic, myself or report to ED with any signs/symptoms of bleeding. Has adequate supply of warfarin. No missed doses.

## 2018-11-30 ENCOUNTER — Other Ambulatory Visit: Payer: Self-pay | Admitting: Internal Medicine

## 2018-11-30 MED ORDER — FUROSEMIDE 40 MG PO TABS: 40.0000 mg | ORAL_TABLET | Freq: Every day | ORAL | 0 refills | Status: DC

## 2018-11-30 MED ORDER — METOPROLOL SUCCINATE ER 25 MG PO TB24: ORAL_TABLET | ORAL | 0 refills | Status: DC

## 2018-11-30 NOTE — Telephone Encounter (Signed)
Needs refill on   furosemide (LASIX) 40 MG tablet  metoprolol succinate (TOPROL-XL) 25 MG 24 hr tablet   ;pt contact [place], [place]

## 2018-12-03 ENCOUNTER — Telehealth: Payer: Self-pay | Admitting: Internal Medicine

## 2018-12-03 DIAGNOSIS — I2782 Chronic pulmonary embolism: Secondary | ICD-10-CM | POA: Diagnosis not present

## 2018-12-03 DIAGNOSIS — Z7901 Long term (current) use of anticoagulants: Secondary | ICD-10-CM | POA: Diagnosis not present

## 2018-12-03 DIAGNOSIS — Z952 Presence of prosthetic heart valve: Secondary | ICD-10-CM | POA: Diagnosis not present

## 2018-12-03 NOTE — Telephone Encounter (Signed)
   Reason for call:   I received a call from the lab for Ms. [name] INR result indicating that is was 5.0. This occurred at ~1115am.   Pertinent Data:   I called to notify the patient of her result. In addition she was asked if she was experiencing any bleeding, rectally, orally, vaginally, or had urinary bleeding to which she denied any blood loss. She denied dizziness, headache, visual changes, nausea, vomiting or any concerning symptoms.    Assessment / Plan / Recommendations:   I advised her to hold today and tomorrows dose of warfarin and to follow-up with the clinic on Monday.   As always, pt is advised that if symptoms worsen or new symptoms arise, they should go to an urgent care facility or to to ER for further evaluation.   Kathi Ludwig, MD   12/03/2018, 11:21 AM

## 2018-12-05 ENCOUNTER — Telehealth: Payer: Self-pay | Admitting: Pharmacist

## 2018-12-05 ENCOUNTER — Ambulatory Visit (INDEPENDENT_AMBULATORY_CARE_PROVIDER_SITE_OTHER): Payer: Medicare Other | Admitting: Pharmacist

## 2018-12-05 ENCOUNTER — Other Ambulatory Visit: Payer: Self-pay

## 2018-12-05 DIAGNOSIS — Z7901 Long term (current) use of anticoagulants: Secondary | ICD-10-CM | POA: Diagnosis not present

## 2018-12-05 DIAGNOSIS — I2699 Other pulmonary embolism without acute cor pulmonale: Secondary | ICD-10-CM

## 2018-12-05 LAB — PROTIME-INR
INR: 3.9 — ABNORMAL HIGH (ref 0.8–1.2)
Prothrombin Time: 37.8 seconds — ABNORMAL HIGH (ref 11.4–15.2)

## 2018-12-05 NOTE — Telephone Encounter (Signed)
Advised daughter to bring patient to the [facility] for a venous drawn sample as soon  as possible TODAY.

## 2018-12-05 NOTE — Telephone Encounter (Signed)
Thank Dr. Elie Confer. I appreciate the assistance with this call.

## 2018-12-05 NOTE — Telephone Encounter (Signed)
The patient texted me at home on Saturday 03-Dec-2018 at 10:22AM reporting a patient self-testing point of care fingerstick INR as 5.8 Patient's daughter was contacted by text and advised to tell the patient to OMIT/HOLD the dose on Saturday 03-Dec-2018. On Sunday, she was to have taken 1 and 1/2 of her 5mg  peach-colored warfarin tablets and today she was to have taken only one tablet. While typing this, I received a text at [phone] today 05-Dec-2018 with an image showing an error reading of POC Testing device indicating either insufficient sample OR too high INR > 8.0. Daughter was called by phone and advised to bring her mom to the [facility] for venous drawn INR. Denies any sign or symptom of bleeding at present time.

## 2018-12-05 NOTE — Telephone Encounter (Signed)
Called patient (as well as texting and sent PHOTO of her NEW INSTRUCTIONS. Venous sample in lab = 3.9 (not >8.0 as per her PST device). Based upon this value--take 5mg  QD, M-Sat; 7.5mg  next SUNDAY. REPEAT PST FS POC INR Mon 12-Dec-2018 and text to me. While here for her lab test, she denied any new medications, no extra-doses, no febrile illness that she knew of. No signs/symptoms of bleeding.

## 2018-12-05 NOTE — Telephone Encounter (Signed)
The patient texted me at home on Saturday 03-Dec-2018 at 10:22AM reporting a patient self-testing point of care fingerstick INR as 5.8 Patient's daughter was contacted by text and advised to tell the patient to OMIT/HOLD the dose on Saturday 03-Dec-2018. On Sunday, she was to have taken 1 and 1/2 of her 5mg  peach-colored warfarin tablets and today she was to have taken only one tablet. While typing this, I received a text at [phone] today 05-Dec-2018 with an image showing an error reading of POC Testing device indicating either insufficient sample OR too high INR > 8.0. Daughter was called by phone and advised to bring her mom to the [facility] for venous drawn INR. Denies any sign or symptom of bleeding at present time.

## 2018-12-05 NOTE — Progress Notes (Signed)
Anticoagulation Management Kim Anthony is a 68 y.o. female who reports to the clinic for monitoring of warfarin treatment.    Indication: PE, Long term current use of anticoagulant.   Duration: 1 year Supervising physician: Joni Reining  Anticoagulation Clinic Visit History: Patient does not report signs/symptoms of bleeding or thromboembolism  Other recent changes: No diet, medications, lifestyle changes endorsed in this patient.  Anticoagulation Episode Summary    Current INR goal:  2.5-3.5  TTR:  31.5 % (3.3 y)  Next INR check:  12/12/2018  INR from last check:  3.9 (12/05/2018)  Weekly max warfarin dose:    Target end date:    INR check location:  Anticoagulation Clinic  Preferred lab:    Send INR reminders to:     Indications   Pulmonary embolism (HCC) [I26.99]       Comments:          Allergies  Allergen Reactions  . Acetaminophen-Codeine Itching  . Other Itching and Other (See Comments)    Darvocet  . Tramadol Itching and Nausea And Vomiting    Current Outpatient Medications:  .  albuterol (PROVENTIL HFA;VENTOLIN HFA) 108 (90 Base) MCG/ACT inhaler, Inhale 1-2 puffs into the lungs every 6 (six) hours as needed for wheezing or shortness of breath., Disp: 6.7 g, Rfl: 2 .  albuterol (PROVENTIL) (2.5 MG/3ML) 0.083% nebulizer solution, Take 3 mLs (2.5 mg total) by nebulization every 6 (six) hours as needed for wheezing or shortness of breath., Disp: 75 mL, Rfl: 12 .  ANORO ELLIPTA 62.5-25 MCG/INH AEPB, Inhale 1 puff by mouth once daily (Patient taking differently: Inhale 1 puff into the lungs daily. ), Disp: 60 each, Rfl: 11 .  fluticasone (FLONASE) 50 MCG/ACT nasal spray, Place 2 sprays into both nostrils daily. (Patient taking differently: Place 1 spray into both nostrils daily as needed for allergies. ), Disp: 16 g, Rfl: 2 .  furosemide (LASIX) 40 MG tablet, Take 1 tablet (40 mg total) by mouth daily., Disp: 90 tablet, Rfl: 0 .  gabapentin (NEURONTIN) 600 MG tablet,  Take 1 tablet (600 mg total) by mouth 2 (two) times daily., Disp: 60 tablet, Rfl: 4 .  lisinopril (ZESTRIL) 10 MG tablet, Take 1 tablet (10 mg total) by mouth at bedtime., Disp: 90 tablet, Rfl: 3 .  metoprolol succinate (TOPROL-XL) 25 MG 24 hr tablet, TAKE 1/2 (ONE-HALF) TABLET BY MOUTH ONCE DAILY, Disp: 45 tablet, Rfl: 0 .  Multiple Minerals-Vitamins (CITRACAL PLUS) TABS, Take 1 tablet by mouth 2 (two) times daily., Disp: , Rfl:  .  potassium chloride SA (K-DUR,KLOR-CON) 20 MEQ tablet, Take 2 tablets (40 mEq total) by mouth daily. (Patient taking differently: Take 20 mEq by mouth 2 (two) times daily. ), Disp: 180 tablet, Rfl: 0 .  simvastatin (ZOCOR) 20 MG tablet, Take 1 tablet by mouth once daily, Disp: 90 tablet, Rfl: 0 .  warfarin (COUMADIN) 5 MG tablet, Take 1 tablet (5 mg total) by mouth daily. (Patient taking differently: Take 5-7.5 mg by mouth daily at 6 PM. Take 7.5mg  on Wednesdays and take 5mg  all other days of the week.), Disp: 30 tablet, Rfl: 3 Past Medical History:  Diagnosis Date  . Asthma   . Breast cancer ([place])    Left Breast Cancer  . CHF (congestive heart failure) ([place])   . COPD (chronic obstructive pulmonary disease) ([place])   . Coronary artery disease   . History of mitral valve replacement with mechanical valve   . HLD (hyperlipidemia)   . Hypertension   .  Pulmonary embolism [facility])    Social History   Socioeconomic History  . Marital status: Single    Spouse name: Not on file  . Number of children: Not on file  . Years of education: Not on file  . Highest education level: Not on file  Occupational History  . Not on file  Social Needs  . Financial resource strain: Not on file  . Food insecurity    Worry: Not on file    Inability: Not on file  . Transportation needs    Medical: Not on file    Non-medical: Not on file  Tobacco Use  . Smoking status: Current Every Day Smoker    Packs/day: 0.50    Types: Cigarettes  . Smokeless tobacco: Never Used  . Tobacco  comment: Sometimes less.  Substance and Sexual Activity  . Alcohol use: Yes    Comment: beer  . Drug use: No  . Sexual activity: Not Currently    Birth control/protection: None  Lifestyle  . Physical activity    Days per week: Not on file    Minutes per session: Not on file  . Stress: Not on file  Relationships  . Social Herbalist on phone: Not on file    Gets together: Not on file    Attends religious service: Not on file    Active member of club or organization: Not on file    Attends meetings of clubs or organizations: Not on file    Relationship status: Not on file  Other Topics Concern  . Not on file  Social History Narrative   ** Merged History Encounter **       Family History  Problem Relation Age of Onset  . Heart attack Other   . Breast cancer Maternal Aunt 50    ASSESSMENT Recent Results: The most recent result is correlated with 42.5 mg per week: Lab Results  Component Value Date   INR 3.9 (H) 12/05/2018   INR 4.8 (A) 11/04/2018   INR 5.0 (A) 10/28/2018    Anticoagulation Dosing: Description   Take Warfarin 5mg  by mouth every day except take 7.5mg  on Sundays.      INR today: Subtherapeutic  PLAN Weekly dose was decreased by 12% to 37.5 mg per week  There are no Patient Instructions on file for this visit. Patient advised to contact clinic or seek medical attention if signs/symptoms of bleeding or thromboembolism occur.  Patient verbalized understanding by repeating back information and was advised to contact me if further medication-related questions arise. Patient was also provided an information handout.  Follow-up Return in 1 week (on 12/12/2018) for Follow up INR.  Pennie Banter, PharmD, CPP  15 minutes spent face-to-face with the patient during the encounter. 50% of time spent on education, including signs/sx bleeding and clotting, as well as food and drug interactions with warfarin. 50% of time was spent on fingerprick POC INR  sample collection,processing, results determination, and documentation in [email or web address]

## 2018-12-08 NOTE — Telephone Encounter (Signed)
Appt kept on 12/05/2018.

## 2018-12-10 ENCOUNTER — Encounter: Payer: Self-pay | Admitting: *Deleted

## 2018-12-14 ENCOUNTER — Telehealth: Payer: Self-pay

## 2018-12-14 ENCOUNTER — Telehealth: Payer: Self-pay | Admitting: Pharmacist

## 2018-12-14 NOTE — Telephone Encounter (Signed)
Received text from Mrs. Bayard Hugger daughter, [name] indicating same, I.e. result TODAY 14-Dec-2018 is 2.6 on 1 and 1/2 x 5mg  strength peach-colored warfarin tablets on Sundays, all other days just one (1) of her 5mg  peach-colored warfarin tablets. Was advised to INCREASE to 1 and 1/2 x 5mg  strength peach-colored warfarin tablets on SUNDAYS AND WEDNESDAYS--all other days, only 1x5mg  strength warfarin tablet. Perform patient self-testing, point of care fingerstick INR next on Wednesday 21-Dec-2018. Sent photo of these instructions to the patient's daughter securely.

## 2018-12-14 NOTE — Telephone Encounter (Signed)
Received fax from [facility] with INR results from 10/12/18 - 12/14/18.  INR result is 2.6 from 12/14/18.   Will forward to Dr. Elie Confer and place faxed copy in Dr. Gladstone Pih box. SChaplin, RN,BSN

## 2018-12-14 NOTE — Telephone Encounter (Signed)
Texted by daughter her INR result of 2.6 on 14-Dec-2018. Advised to INCREASE warfarin dose to 1 and 1/2 x 5mg  on SUNDAYS and WEDNESDAYS; 5mg  (1 tab) all other days. Repeat on 21-Dec-2018. No bleeding symptoms.   Of note:  Patient is scheduled for a procedure by Dr. Rush Farmer (Surgery) on 28-Dec-2018. The following BRIDGING INSTRUCTIONS have been provided to patient's daughter/patient:  Anticoagulation Bridging Plan for Kim Anthony, [name] 08/16/1950 Planned Date of Surgery, Wednesday, 28-December-2018 at 8:30 AM  1. Thursday, December 22, 2018-LAST DOSE OF WARFARIN (Coumadin).  2. Saturday, July 4, -commencing with the 8PM dose-administer enoxaparin (Lovenox) 80mg  (1 syringe) every 12 hours at 8AM and 8PM. 3. Sunday at 8AM and 8PM-administer 80mg  (1 syringe). Administer at level of the waistband, minimally, two (2) inches AWAY from the "belly-button", rotating side-to-side (right-left-right, etc.) Avoid rubbing the areas of injection too much-as this causes additional bruising.  4. LAST DOSE OF ENOXAPARIN/LOVENOX will be at [facility] on TUESDAY July 7th, 2020.  5. Post-operatively, if the patient is discharged the same day as the surgery, recommence enoxaparin/Lovenox the following day, Thursday, 29-December-2018 at [facility] at [place] until Monday morning, July 13th, 2020.  6. Post-operatively, on Thursday, 29-December-2018, recommence warfarin/Coumadin at the last dose regimen provided (1 of her 5mg  strength warfarin tablets at [facility] starting on Thursday, 29-December-2018). Give 1 of her 5mg  strength warfarin tablets DAILY-except on Sunday, give her 1 and  tablets (7.5mg  total dose).  7. [facility] July 13th, perform patient self-testing, point of care, fingerstick INR determination and text Dr. Elie Confer at [phone] the results and he will advise if enoxaparin/Lovenox is to be continued or discontinued-as well as the dose of warfarin.  8. CALL PHARMACIST, Dr. Jorene Guest with ANY QUESTIONS-or if the procedure  were to be cancelled/re-scheduled.

## 2018-12-15 LAB — PROTIME-INR

## 2018-12-19 ENCOUNTER — Telehealth: Payer: Self-pay | Admitting: Plastic Surgery

## 2018-12-19 NOTE — Telephone Encounter (Signed)

## 2018-12-20 ENCOUNTER — Other Ambulatory Visit: Payer: Self-pay

## 2018-12-20 ENCOUNTER — Encounter: Payer: Self-pay | Admitting: Surgical

## 2018-12-20 ENCOUNTER — Telehealth: Payer: Self-pay | Admitting: Plastic Surgery

## 2018-12-20 ENCOUNTER — Ambulatory Visit (INDEPENDENT_AMBULATORY_CARE_PROVIDER_SITE_OTHER): Payer: Medicare Other | Admitting: Surgical

## 2018-12-20 ENCOUNTER — Other Ambulatory Visit: Payer: Self-pay | Admitting: Pharmacist

## 2018-12-20 VITALS — BP 132/79 | HR 78 | Temp 98.2°F | Ht 65.0 in | Wt 165.8 lb

## 2018-12-20 DIAGNOSIS — S41102D Unspecified open wound of left upper arm, subsequent encounter: Secondary | ICD-10-CM

## 2018-12-20 DIAGNOSIS — Z9012 Acquired absence of left breast and nipple: Secondary | ICD-10-CM

## 2018-12-20 MED ORDER — CEPHALEXIN 500 MG PO CAPS: 500.0000 mg | ORAL_CAPSULE | Freq: Four times a day (QID) | ORAL | 0 refills | Status: AC

## 2018-12-20 MED ORDER — ONDANSETRON HCL 4 MG PO TABS: 4.0000 mg | ORAL_TABLET | Freq: Three times a day (TID) | ORAL | 0 refills | Status: AC | PRN

## 2018-12-20 MED ORDER — ENOXAPARIN SODIUM 30 MG/0.3ML ~~LOC~~ SOLN: 30.0000 mg | SUBCUTANEOUS | 0 refills | Status: DC

## 2018-12-20 NOTE — H&P (View-Only) (Signed)
Patient ID: Kim Anthony, female    DOB: September 18, 1950, 68 y.o.   [name]: [id number]    ICD-10-CM   1. Open wound of left axillary region, subsequent encounter  S41.102D   2. Acquired absence of left breast  Z90.12     History of Present Illness: Kim Anthony is a 68 y.o.  female  with a history of left mastectomy with complete axillary dissection and postop radiation in 2001. She has significant reduction in abduction on this left side due to the scar contraction. She presents for preoperative evaluation for upcoming procedure: L pectoralis muscle release, with possible left-sided latissimus flap if unable to close. This will be scheduled with Dr. Ulice Bold and Dr. Magnus Ivan.   She is doing well today. She has a pinpoint shallow wound near her axillary fold on the left side, no erythema or sign of infection.  Past Medical History: Allergies: Allergies  Allergen Reactions  . Acetaminophen-Codeine Itching  . Other Itching and Other (See Comments)    Darvocet  . Tramadol Itching and Nausea And Vomiting    Current Medications:  Current Outpatient Medications:  .  albuterol (PROVENTIL HFA;VENTOLIN HFA) 108 (90 Base) MCG/ACT inhaler, Inhale 1-2 puffs into the lungs every 6 (six) hours as needed for wheezing or shortness of breath., Disp: 6.7 g, Rfl: 2 .  albuterol (PROVENTIL) (2.5 MG/3ML) 0.083% nebulizer solution, Take 3 mLs (2.5 mg total) by nebulization every 6 (six) hours as needed for wheezing or shortness of breath., Disp: 75 mL, Rfl: 12 .  ANORO ELLIPTA 62.5-25 MCG/INH AEPB, Inhale 1 puff by mouth once daily (Patient taking differently: Inhale 1 puff into the lungs daily. ), Disp: 60 each, Rfl: 11 .  Calcium-Phosphorus-Vitamin D (CITRACAL +D3 PO), Take 1 tablet by mouth 2 (two) times a day., Disp: , Rfl:  .  fluticasone (FLONASE) 50 MCG/ACT nasal spray, Place 2 sprays into both nostrils daily. (Patient taking differently: Place 1 spray into both nostrils daily as needed for allergies. ),  Disp: 16 g, Rfl: 2 .  furosemide (LASIX) 40 MG tablet, Take 1 tablet (40 mg total) by mouth daily., Disp: 90 tablet, Rfl: 0 .  gabapentin (NEURONTIN) 600 MG tablet, Take 1 tablet (600 mg total) by mouth 2 (two) times daily., Disp: 60 tablet, Rfl: 4 .  lisinopril (ZESTRIL) 10 MG tablet, Take 1 tablet (10 mg total) by mouth at bedtime., Disp: 90 tablet, Rfl: 3 .  metoprolol succinate (TOPROL-XL) 25 MG 24 hr tablet, TAKE 1/2 (ONE-HALF) TABLET BY MOUTH ONCE DAILY (Patient taking differently: Take 12.5 mg by mouth daily. ), Disp: 45 tablet, Rfl: 0 .  potassium chloride SA (K-DUR,KLOR-CON) 20 MEQ tablet, Take 2 tablets (40 mEq total) by mouth daily. (Patient taking differently: Take 20 mEq by mouth 2 (two) times daily. ), Disp: 180 tablet, Rfl: 0 .  simvastatin (ZOCOR) 20 MG tablet, Take 1 tablet by mouth once daily (Patient taking differently: Take 20 mg by mouth daily. ), Disp: 90 tablet, Rfl: 0 .  warfarin (COUMADIN) 5 MG tablet, Take 1 tablet (5 mg total) by mouth daily. (Patient taking differently: Take 5-7.5 mg by mouth daily at 6 PM. Take 7.5mg  on Wednesday and Sunday, take 5mg  all other days of the week.), Disp: 30 tablet, Rfl: 3  Past Medical Problems: Past Medical History:  Diagnosis Date  . Asthma   . Breast cancer (HCC)    Left Breast Cancer  . CHF (congestive heart failure) (HCC)   . COPD (chronic obstructive pulmonary  disease) (HCC)   . Coronary artery disease   . History of mitral valve replacement with mechanical valve   . HLD (hyperlipidemia)   . Hypertension   . Pulmonary embolism [facility])     Past Surgical History: Past Surgical History:  Procedure Laterality Date  . COLONOSCOPY WITH PROPOFOL N/A 06/07/2018   Procedure: COLONOSCOPY WITH PROPOFOL;  Surgeon: Charlott Rakes, MD;  Location: [facility];  Service: Endoscopy;  Laterality: N/A;  . MASTECTOMY Left 2000s   for breast cancer, also s/p radiation and chemo  . MECHANICAL AORTIC AND MITRAL VALVE REPLACEMENT    .  POLYPECTOMY  06/07/2018   Procedure: POLYPECTOMY;  Surgeon: Charlott Rakes, MD;  Location: [facility];  Service: Endoscopy;;    The patient has had anesthesia or sedation in the past.   The patient has not had problems with anesthesia.  The patient does not have a family history of anesthesia problems.   Social History: Social History   Socioeconomic History  . Marital status: Single    Spouse name: Not on file  . Number of children: Not on file  . Years of education: Not on file  . Highest education level: Not on file  Occupational History  . Not on file  Social Needs  . Financial resource strain: Not on file  . Food insecurity    Worry: Not on file    Inability: Not on file  . Transportation needs    Medical: Not on file    Non-medical: Not on file  Tobacco Use  . Smoking status: Current Every Day Smoker    Packs/day: 0.50    Types: Cigarettes  . Smokeless tobacco: Never Used  . Tobacco comment: Sometimes less.  Substance and Sexual Activity  . Alcohol use: Yes    Comment: beer  . Drug use: No  . Sexual activity: Not Currently    Birth control/protection: None  Lifestyle  . Physical activity    Days per week: Not on file    Minutes per session: Not on file  . Stress: Not on file  Relationships  . Social Musician on phone: Not on file    Gets together: Not on file    Attends religious service: Not on file    Active member of club or organization: Not on file    Attends meetings of clubs or organizations: Not on file    Relationship status: Not on file  . Intimate partner violence    Fear of current or ex partner: Not on file    Emotionally abused: Not on file    Physically abused: Not on file    Forced sexual activity: Not on file  Other Topics Concern  . Not on file  Social History Narrative   ** Merged History Encounter **        Family History: Family History  Problem Relation Age of Onset  . Heart attack Other   . Breast cancer  Maternal Aunt 50    Review of Systems  Constitutional: Negative.  Negative for chills, fever and malaise/fatigue.  HENT: Negative.   Respiratory: Negative.   Cardiovascular: Negative.   Gastrointestinal: Negative.   Neurological: Negative for dizziness and headaches.    Physical Exam: Vital Signs BP 132/79 (BP Location: Right Arm, Patient Position: Sitting, Cuff Size: Normal)   Pulse 78   Temp 98.2 F (36.8 C) (Oral)   Ht 5\' 5"  (1.651 m)   Wt 165 lb 12.8 oz (75.2 kg)  SpO2 97%   BMI 27.59 kg/m  General:alert, cooperative and no distress HEENT: normocephalic, atraumatic. Neck: supple, full ROM, no enlarged lymphnodes. Chest: symmetrical rise and fall CV: r/r/r, mechanical valve noted. Distal pulses intact. Breast: L breast mastectomy scar, contracted. pinpoint wound under axilla.  Cardiac: regular rate and rhythm. Mechanical heartvalve noted.  Abdomen: soft, non-distended Musculoskeletal: MAE x4, decreased abduction of L shoulder. Neuro: no focal deficits. Skin: pinpoint wound under axillary section of her mastectomy scar.  Assessment/Plan:   ICD-10-CM   1. Open wound of left axillary region, subsequent encounter  S41.102D   2. Acquired absence of left breast  Z90.12     Plan for L pectoralis muscle release, with possible left-sided latissimus flap if unable to close.  She was encouraged to discontinue smoking or decrease the amount of smoking to promote good wound healing.  She stated she has nicotine patches and gum at home.  Encouraged her to try one or the other and to make sure that she does not doubled up or smoke while using an alternative.   She was given instructions on healthy eating choices and to limit and decrease her carbs and sugars prior and after surgery.   Will need a Lovenox bridge before surgery due to mechanical heart valve and hx of PE.  The consent was obtained with risks and complications reviewed which included bleeding, pain, scar, infection  and the risk of anesthesia.  The patients questions were answered to the patients expressed satisfaction.  Kermit Balo Zakee Deerman, PA-C

## 2018-12-20 NOTE — Progress Notes (Signed)
Patient ID: Kim Anthony, female    DOB: September 18, 1950, 68 y.o.   [name]: [id number]    ICD-10-CM   1. Open wound of left axillary region, subsequent encounter  S41.102D   2. Acquired absence of left breast  Z90.12     History of Present Illness: Kim Anthony is a 68 y.o.  female  with a history of left mastectomy with complete axillary dissection and postop radiation in 2001. She has significant reduction in abduction on this left side due to the scar contraction. She presents for preoperative evaluation for upcoming procedure: L pectoralis muscle release, with possible left-sided latissimus flap if unable to close. This will be scheduled with Dr. Ulice Bold and Dr. Magnus Ivan.   She is doing well today. She has a pinpoint shallow wound near her axillary fold on the left side, no erythema or sign of infection.  Past Medical History: Allergies: Allergies  Allergen Reactions  . Acetaminophen-Codeine Itching  . Other Itching and Other (See Comments)    Darvocet  . Tramadol Itching and Nausea And Vomiting    Current Medications:  Current Outpatient Medications:  .  albuterol (PROVENTIL HFA;VENTOLIN HFA) 108 (90 Base) MCG/ACT inhaler, Inhale 1-2 puffs into the lungs every 6 (six) hours as needed for wheezing or shortness of breath., Disp: 6.7 g, Rfl: 2 .  albuterol (PROVENTIL) (2.5 MG/3ML) 0.083% nebulizer solution, Take 3 mLs (2.5 mg total) by nebulization every 6 (six) hours as needed for wheezing or shortness of breath., Disp: 75 mL, Rfl: 12 .  ANORO ELLIPTA 62.5-25 MCG/INH AEPB, Inhale 1 puff by mouth once daily (Patient taking differently: Inhale 1 puff into the lungs daily. ), Disp: 60 each, Rfl: 11 .  Calcium-Phosphorus-Vitamin D (CITRACAL +D3 PO), Take 1 tablet by mouth 2 (two) times a day., Disp: , Rfl:  .  fluticasone (FLONASE) 50 MCG/ACT nasal spray, Place 2 sprays into both nostrils daily. (Patient taking differently: Place 1 spray into both nostrils daily as needed for allergies. ),  Disp: 16 g, Rfl: 2 .  furosemide (LASIX) 40 MG tablet, Take 1 tablet (40 mg total) by mouth daily., Disp: 90 tablet, Rfl: 0 .  gabapentin (NEURONTIN) 600 MG tablet, Take 1 tablet (600 mg total) by mouth 2 (two) times daily., Disp: 60 tablet, Rfl: 4 .  lisinopril (ZESTRIL) 10 MG tablet, Take 1 tablet (10 mg total) by mouth at bedtime., Disp: 90 tablet, Rfl: 3 .  metoprolol succinate (TOPROL-XL) 25 MG 24 hr tablet, TAKE 1/2 (ONE-HALF) TABLET BY MOUTH ONCE DAILY (Patient taking differently: Take 12.5 mg by mouth daily. ), Disp: 45 tablet, Rfl: 0 .  potassium chloride SA (K-DUR,KLOR-CON) 20 MEQ tablet, Take 2 tablets (40 mEq total) by mouth daily. (Patient taking differently: Take 20 mEq by mouth 2 (two) times daily. ), Disp: 180 tablet, Rfl: 0 .  simvastatin (ZOCOR) 20 MG tablet, Take 1 tablet by mouth once daily (Patient taking differently: Take 20 mg by mouth daily. ), Disp: 90 tablet, Rfl: 0 .  warfarin (COUMADIN) 5 MG tablet, Take 1 tablet (5 mg total) by mouth daily. (Patient taking differently: Take 5-7.5 mg by mouth daily at 6 PM. Take 7.5mg  on Wednesday and Sunday, take 5mg  all other days of the week.), Disp: 30 tablet, Rfl: 3  Past Medical Problems: Past Medical History:  Diagnosis Date  . Asthma   . Breast cancer (HCC)    Left Breast Cancer  . CHF (congestive heart failure) (HCC)   . COPD (chronic obstructive pulmonary  disease) (HCC)   . Coronary artery disease   . History of mitral valve replacement with mechanical valve   . HLD (hyperlipidemia)   . Hypertension   . Pulmonary embolism [facility])     Past Surgical History: Past Surgical History:  Procedure Laterality Date  . COLONOSCOPY WITH PROPOFOL N/A 06/07/2018   Procedure: COLONOSCOPY WITH PROPOFOL;  Surgeon: Charlott Rakes, MD;  Location: [facility];  Service: Endoscopy;  Laterality: N/A;  . MASTECTOMY Left 2000s   for breast cancer, also s/p radiation and chemo  . MECHANICAL AORTIC AND MITRAL VALVE REPLACEMENT    .  POLYPECTOMY  06/07/2018   Procedure: POLYPECTOMY;  Surgeon: Charlott Rakes, MD;  Location: [facility];  Service: Endoscopy;;    The patient has had anesthesia or sedation in the past.   The patient has not had problems with anesthesia.  The patient does not have a family history of anesthesia problems.   Social History: Social History   Socioeconomic History  . Marital status: Single    Spouse name: Not on file  . Number of children: Not on file  . Years of education: Not on file  . Highest education level: Not on file  Occupational History  . Not on file  Social Needs  . Financial resource strain: Not on file  . Food insecurity    Worry: Not on file    Inability: Not on file  . Transportation needs    Medical: Not on file    Non-medical: Not on file  Tobacco Use  . Smoking status: Current Every Day Smoker    Packs/day: 0.50    Types: Cigarettes  . Smokeless tobacco: Never Used  . Tobacco comment: Sometimes less.  Substance and Sexual Activity  . Alcohol use: Yes    Comment: beer  . Drug use: No  . Sexual activity: Not Currently    Birth control/protection: None  Lifestyle  . Physical activity    Days per week: Not on file    Minutes per session: Not on file  . Stress: Not on file  Relationships  . Social Musician on phone: Not on file    Gets together: Not on file    Attends religious service: Not on file    Active member of club or organization: Not on file    Attends meetings of clubs or organizations: Not on file    Relationship status: Not on file  . Intimate partner violence    Fear of current or ex partner: Not on file    Emotionally abused: Not on file    Physically abused: Not on file    Forced sexual activity: Not on file  Other Topics Concern  . Not on file  Social History Narrative   ** Merged History Encounter **        Family History: Family History  Problem Relation Age of Onset  . Heart attack Other   . Breast cancer  Maternal Aunt 50    Review of Systems  Constitutional: Negative.  Negative for chills, fever and malaise/fatigue.  HENT: Negative.   Respiratory: Negative.   Cardiovascular: Negative.   Gastrointestinal: Negative.   Neurological: Negative for dizziness and headaches.    Physical Exam: Vital Signs BP 132/79 (BP Location: Right Arm, Patient Position: Sitting, Cuff Size: Normal)   Pulse 78   Temp 98.2 F (36.8 C) (Oral)   Ht 5\' 5"  (1.651 m)   Wt 165 lb 12.8 oz (75.2 kg)  SpO2 97%   BMI 27.59 kg/m  General:alert, cooperative and no distress HEENT: normocephalic, atraumatic. Neck: supple, full ROM, no enlarged lymphnodes. Chest: symmetrical rise and fall CV: r/r/r, mechanical valve noted. Distal pulses intact. Breast: L breast mastectomy scar, contracted. pinpoint wound under axilla.  Cardiac: regular rate and rhythm. Mechanical heartvalve noted.  Abdomen: soft, non-distended Musculoskeletal: MAE x4, decreased abduction of L shoulder. Neuro: no focal deficits. Skin: pinpoint wound under axillary section of her mastectomy scar.  Assessment/Plan:   ICD-10-CM   1. Open wound of left axillary region, subsequent encounter  S41.102D   2. Acquired absence of left breast  Z90.12     Plan for L pectoralis muscle release, with possible left-sided latissimus flap if unable to close.  She was encouraged to discontinue smoking or decrease the amount of smoking to promote good wound healing.  She stated she has nicotine patches and gum at home.  Encouraged her to try one or the other and to make sure that she does not doubled up or smoke while using an alternative.   She was given instructions on healthy eating choices and to limit and decrease her carbs and sugars prior and after surgery.   Will need a Lovenox bridge before surgery due to mechanical heart valve and hx of PE.  The consent was obtained with risks and complications reviewed which included bleeding, pain, scar, infection  and the risk of anesthesia.  The patients questions were answered to the patients expressed satisfaction.  Kermit Balo Zakee Deerman, PA-C

## 2018-12-20 NOTE — Telephone Encounter (Signed)
Received call from patient's daughter who stated that she has questions about her mother's surgery scheduled for 7/8. Please call back at [phone]

## 2018-12-20 NOTE — Addendum Note (Signed)
Addended by: Wallace Going on: 12/20/2018 06:30 PM   Modules accepted: Orders

## 2018-12-21 ENCOUNTER — Other Ambulatory Visit: Payer: Self-pay | Admitting: Pharmacist

## 2018-12-21 MED ORDER — ENOXAPARIN SODIUM 80 MG/0.8ML ~~LOC~~ SOLN: 80.0000 mg | Freq: Two times a day (BID) | SUBCUTANEOUS | 0 refills | Status: DC

## 2018-12-21 NOTE — Progress Notes (Signed)
[place], [place] [address] Phone: [phone] Fax: [phone]      Your procedure is scheduled on July 8th.  Report to [facility] Main Entrance "A" at 6:30 A.M., and check in at the Admitting office.  Call this number if you have problems the morning of surgery:  [phone]  Call [phone] if you have any questions prior to your surgery date Monday-Friday 8am-4pm    Remember:  Do not eat or drink after midnight the night before your surgery    Take these medicines the morning of surgery with A SIP OF WATER   Gabapentin (Neurontin)  Metoprolol   Zofran - if needed  Simvastatin  Albuterol Inhaler - if needed  Albuterol Nebulizer - if needed  Anoro Ellipta Inhaler - if needed  Flonase - if needed  Follow your surgeon's instructions on when to stop Coumadin (Warfarin).  If no instructions were given by your surgeon then you will need to call the office to get those instructions.    7 days prior to surgery STOP taking any Aspirin (unless otherwise instructed by your surgeon), Aleve, Naproxen, Ibuprofen, Motrin, Advil, Goody's, BC's, all herbal medications, fish oil, and all vitamins.    The Morning of Surgery  Do not wear jewelry, make-up or nail polish.  Do not wear lotions, powders, or perfumes, or deodorant  Do not shave 48 hours prior to surgery.    Do not bring valuables to the hospital.  [facility] is not responsible for any belongings or valuables.  If you are a smoker, DO NOT Smoke 24 hours prior to surgery IF you wear a CPAP at night please bring your mask, tubing, and machine the morning of surgery   Remember that you must have someone to transport you home after your surgery, and remain with you for 24 hours if you are discharged the same day.   Contacts, glasses, hearing aids, dentures or bridgework may not be worn into surgery.    Leave your suitcase in  the car.  After surgery it may be brought to your room.  For patients admitted to the hospital, discharge time will be determined by your treatment team.  Patients discharged the day of surgery will not be allowed to drive home.    Special instructions:   [place]- Preparing For Surgery  Before surgery, you can play an important role. Because skin is not sterile, your skin needs to be as free of germs as possible. You can reduce the number of germs on your skin by washing with CHG (chlorahexidine gluconate) Soap before surgery.  CHG is an antiseptic cleaner which kills germs and bonds with the skin to continue killing germs even after washing.    Oral Hygiene is also important to reduce your risk of infection.  Remember - BRUSH YOUR TEETH THE MORNING OF SURGERY WITH YOUR REGULAR TOOTHPASTE  Please do not use if you have an allergy to CHG or antibacterial soaps. If your skin becomes reddened/irritated stop using the CHG.  Do not shave (including legs and underarms) for at least 48 hours prior to first CHG shower. It is OK to shave your face.  Please follow these instructions carefully.   1. Shower the NIGHT BEFORE SURGERY and the MORNING OF SURGERY with CHG Soap.   2. If you chose to wash your hair, wash your hair first as usual with your normal shampoo.  3. After you  shampoo, rinse your hair and body thoroughly to remove the shampoo.  4. Use CHG as you would any other liquid soap. You can apply CHG directly to the skin and wash gently with a scrungie or a clean washcloth.   5. Apply the CHG Soap to your body ONLY FROM THE NECK DOWN.  Do not use on open wounds or open sores. Avoid contact with your eyes, ears, mouth and genitals (private parts). Wash Face and genitals (private parts)  with your normal soap.   6. Wash thoroughly, paying special attention to the area where your surgery will be performed.  7. Thoroughly rinse your body with warm water from the neck down.  8. DO NOT  shower/wash with your normal soap after using and rinsing off the CHG Soap.  9. Pat yourself dry with a CLEAN TOWEL.  10. Wear CLEAN PAJAMAS to bed the night before surgery, wear comfortable clothes the morning of surgery  11. Place CLEAN SHEETS on your bed the night of your first shower and DO NOT SLEEP WITH PETS.   Day of Surgery:  Do not apply any deodorants/lotions. Please shower the morning of surgery with the CHG soap  Please wear clean clothes to the hospital/surgery center.   Remember to brush your teeth WITH YOUR REGULAR TOOTHPASTE.   Please read over the following fact sheets that you were given.

## 2018-12-21 NOTE — Progress Notes (Signed)
30mg  once daily would be a prophylaxis dose for a renally impaired patient. She needs 1mg /kg SQ q12h for bridging for her valve. Sending an eScript for 80mg  SQ q12h starting on 24-Dec-2018 at 8PM. Last dose is 8AM on 27-Dec-2018.

## 2018-12-22 ENCOUNTER — Other Ambulatory Visit: Payer: Self-pay

## 2018-12-22 ENCOUNTER — Encounter (HOSPITAL_COMMUNITY): Payer: Self-pay

## 2018-12-22 ENCOUNTER — Encounter (HOSPITAL_COMMUNITY)
Admission: RE | Admit: 2018-12-22 | Discharge: 2018-12-22 | Disposition: A | Discharge status: Home / Self Care | Payer: Medicare Other | Source: Ambulatory Visit | Attending: Surgery | Admitting: Surgery

## 2018-12-22 DIAGNOSIS — Z01812 Encounter for preprocedural laboratory examination: Secondary | ICD-10-CM | POA: Diagnosis not present

## 2018-12-22 LAB — BASIC METABOLIC PANEL
Anion gap: 10 (ref 5–15)
BUN: 25 mg/dL — ABNORMAL HIGH (ref 8–23)
CO2: 21 mmol/L — ABNORMAL LOW (ref 22–32)
Calcium: 9.4 mg/dL (ref 8.9–10.3)
Chloride: 106 mmol/L (ref 98–111)
Creatinine, Ser: 0.77 mg/dL (ref 0.44–1.00)
GFR calc Af Amer: >60 mL/min (ref >60)
GFR calc non Af Amer: >60 mL/min (ref >60)
Glucose, Bld: 88 mg/dL (ref 70–99)
Potassium: 4.2 mmol/L (ref 3.5–5.1)
Sodium: 137 mmol/L (ref 135–145)

## 2018-12-22 LAB — CBC
HCT: 39.2 % (ref 36.0–46.0)
Hemoglobin: 12.4 g/dL (ref 12.0–15.0)
MCH: 31.7 pg (ref 26.0–34.0)
MCHC: 31.6 g/dL (ref 30.0–36.0)
MCV: 100.3 fL — ABNORMAL HIGH (ref 80.0–100.0)
Platelets: 430 10*3/uL — ABNORMAL HIGH (ref 150–400)
RBC: 3.91 MIL/uL (ref 3.87–5.11)
RDW: 11.7 % (ref 11.5–15.5)
WBC: 8.1 10*3/uL (ref 4.0–10.5)
nRBC: 0 % (ref 0.0–0.2)

## 2018-12-22 LAB — PROTIME-INR
INR: 2.8 — ABNORMAL HIGH (ref 0.8–1.2)
Prothrombin Time: 28.9 seconds — ABNORMAL HIGH (ref 11.4–15.2)

## 2018-12-22 MED ORDER — CHLORHEXIDINE GLUCONATE CLOTH 2 % EX PADS: 6.0000 | MEDICATED_PAD | Freq: Once | CUTANEOUS | Status: DC

## 2018-12-22 NOTE — Progress Notes (Signed)
PCP - lawrence HARBRECHT   With [facility] Cardiologist - na  Chest x-ray - na EKG - 10/19 Stress Test - na ECHO - 6/19 Cardiac Cath - na    FAspirin Instructions:stopBlood Thinner Instructions: stop on 7/2 followed by bridge  Anesthesia review: heart hx  Patient denies shortness of breath, fever, cough and chest pain at PAT appointment   Patient verbalized understanding of instructions that were given to them at the PAT appointment. Patient was also instructed that they will need to review over the PAT instructions again at home before surgery.

## 2018-12-22 NOTE — Telephone Encounter (Signed)
Returning Mrs. Lattanzio daughters phone call in regards to her upcoming surgery.   I spoke with Mrs. Quade daughter for approximately 15 minutes in regards to her mother's upcoming surgery.  I explained to her the procedure to be performed by Dr. Marla Roe and Dr. Ninfa Linden.   She also had a few questions about post operative medications and changes to her mother's upper body strength due to potential for latissimus flap.  All of her questions were answered and she was advised to call back if she had any further questions, and we would be glad to assist.

## 2018-12-26 ENCOUNTER — Other Ambulatory Visit (HOSPITAL_COMMUNITY)
Admission: RE | Admit: 2018-12-26 | Discharge: 2018-12-26 | Disposition: A | Discharge status: Home / Self Care | Payer: Medicare Other | Source: Ambulatory Visit | Attending: Surgery | Admitting: Surgery

## 2018-12-26 ENCOUNTER — Other Ambulatory Visit: Payer: Self-pay

## 2018-12-26 DIAGNOSIS — Z1159 Encounter for screening for other viral diseases: Secondary | ICD-10-CM | POA: Insufficient documentation

## 2018-12-26 DIAGNOSIS — Z01812 Encounter for preprocedural laboratory examination: Secondary | ICD-10-CM | POA: Diagnosis not present

## 2018-12-26 DIAGNOSIS — E876 Hypokalemia: Secondary | ICD-10-CM

## 2018-12-26 DIAGNOSIS — J449 Chronic obstructive pulmonary disease, unspecified: Secondary | ICD-10-CM

## 2018-12-26 LAB — SARS CORONAVIRUS 2 (TAT 6-24 HRS): SARS Coronavirus 2: NEGATIVE

## 2018-12-26 MED ORDER — POTASSIUM CHLORIDE CRYS ER 20 MEQ PO TBCR: 40.0000 meq | EXTENDED_RELEASE_TABLET | Freq: Every day | ORAL | 0 refills | Status: DC

## 2018-12-26 MED ORDER — ALBUTEROL SULFATE HFA 108 (90 BASE) MCG/ACT IN AERS: 1.0000 | INHALATION_SPRAY | Freq: Four times a day (QID) | RESPIRATORY_TRACT | 2 refills | Status: DC | PRN

## 2018-12-26 NOTE — Telephone Encounter (Signed)
albuterol (PROVENTIL HFA;VENTOLIN HFA) 108 (90 Base) MCG/ACT inhaler  potassium chloride SA (K-DUR,KLOR-CON) 20 MEQ tablet, refill request @  [place], [place] (Phone) [phone] (Fax)

## 2018-12-26 NOTE — Anesthesia Preprocedure Evaluation (Addendum)
Anesthesia Evaluation  Patient identified by [name], date of birth, ID band Patient awake    Reviewed: Allergy & Precautions, NPO status , Patient's Chart, lab work & pertinent test results  Airway Mallampati: II  TM Distance: >3 FB     Dental  (+) Dental Advisory Given   Pulmonary asthma , COPD, Current Smoker,    breath sounds clear to auscultation       Cardiovascular hypertension, Pt. on medications and Pt. on home beta blockers + CAD and +CHF   Rhythm:Regular Rate:Normal  Study Conclusions - Left ventricle: The cavity size was normal. Wall thickness wasnormal. Systolic function was normal. The estimated ejection  fraction was in the range of 50% to 55%. Doppler parameters are  consistent with abnormal left ventricular relaxation (grade 1 diastolic dysfunction). - Mitral valve: A mechanical prosthesis was present. - Pulmonary arteries: Systolic pressure was mildly increased. PA peak pressure: 36 mm Hg (S).    Neuro/Psych  Headaches,    GI/Hepatic negative GI ROS, Neg liver ROS,   Endo/Other  negative endocrine ROS  Renal/GU negative Renal ROS     Musculoskeletal   Abdominal   Peds  Hematology negative hematology ROS (+)   Anesthesia Other Findings   Reproductive/Obstetrics                           Lab Results  Component Value Date   WBC 8.1 12/22/2018   HGB 12.4 12/22/2018   HCT 39.2 12/22/2018   MCV 100.3 (H) 12/22/2018   PLT 430 (H) 12/22/2018   Lab Results  Component Value Date   CREATININE 0.77 12/22/2018   BUN 25 (H) 12/22/2018   NA 137 12/22/2018   K 4.2 12/22/2018   CL 106 12/22/2018   CO2 21 (L) 12/22/2018    Anesthesia Physical Anesthesia Plan  ASA: III  Anesthesia Plan: General   Post-op Pain Management:    Induction: Intravenous  PONV Risk Score and Plan: 2 and Dexamethasone, Ondansetron and Treatment may vary due to age or medical condition  Airway  Management Planned: LMA and Oral ETT  Additional Equipment:   Intra-op Plan:   Post-operative Plan: Extubation in OR  Informed Consent: I have reviewed the patients History and Physical, chart, labs and discussed the procedure including the risks, benefits and alternatives for the proposed anesthesia with the patient or authorized representative who has indicated his/her understanding and acceptance.     Dental advisory given  Plan Discussed with: CRNA  Anesthesia Plan Comments: (History of mitral valve replacement with mechanical valve, on warfarin, this is followed by PCP. Lovenox bridge per pharmacy. Combined HF also followed by PCP. Echo 12/07/17 showed EF 50-55%, grade 1 dd.   TTE 12/07/17:  - Left ventricle: The cavity size was normal. Wall thickness was   normal. Systolic function was normal. The estimated ejection   fraction was in the range of 50% to 55%. Doppler parameters are   consistent with abnormal left ventricular relaxation (grade 1   diastolic dysfunction). - Mitral valve: A mechanical prosthesis was present. - Pulmonary arteries: Systolic pressure was mildly increased. PA   peak pressure: 36 mm Hg (S). )      Anesthesia Quick Evaluation

## 2018-12-27 NOTE — H&P (Signed)
Kim Anthony  Location: [facility] Patient #: [id number] DOB: [birth year] Single / Language: Undefined / Race: Black or African American Female   History of Present Illness   The patient is a 68 year old female who presents for wound check. This patient is referred by the emergency department for a nonhealing wound and possible abscess of the left axilla. She has multiple medical issues including history of pulmonary embolism on Coumadin, CHF, COPD, and breast cancer. She had undergone a left breast mastectomy and complete axillary dissection in [place], [place] approximately 18 years ago. She also underwent radiation therapy. She was on anti-hormonal therapy as well. From a breast standpoint, she has done very well. She no longer sees oncologist. Approximately 6 months ago, she developed an abscess and draining wound in her left axilla at the very lateral edge of her mastectomy incision. She's been on multiple antibiotics without improvement. She also has left shoulder pain. She has chronic pain with limited mobility of the shoulder. She denies fevers or chills. The drainage has been purulent. A CT scan back in December was unremarkable regarding the chest wall and axilla. An ultrasound January 25 digit show an ill-defined 3.8 x 3.8 cm area likely representing fluid collections and not malignancy.   Past Surgical History  Bypass Surgery for Poor Blood Flow to Legs  Colon Polyp Removal - Colonoscopy  Mastectomy  Left. Valve Replacement   Diagnostic Studies History  Colonoscopy  within last year Mammogram  within last year Pap Smear  1-5 years ago  Allergies Nance Pew, CMA; 08/01/2018 9:54 AM) No Known Allergies  [08/01/2018]: No Known Drug Allergies  [08/01/2018]: Allergies Reconciled   Medication History Nance Pew, CMA; 08/01/2018 9:55 AM) Warfarin Sodium (5MG  Tablet, Oral) Active. tiZANidine HCl (4MG  Tablet, Oral) Active. Simvastatin (20MG   Tablet, Oral) Active. Metoprolol Succinate ER (25MG  Tablet ER 24HR, Oral) Active. Lisinopril (10MG  Tablet, Oral) Active. Clindamycin HCl (300MG  Capsule, Oral) Active. Cephalexin (500MG  Capsule, Oral) Active. Potassium Chloride Crys ER (20MEQ Tablet ER, Oral) Active. Medications Reconciled  Social History Alcohol use  Occasional alcohol use. Caffeine use  Carbonated beverages, Coffee. No drug use  Tobacco use  Current every day smoker.  Family History Nance Pew, [place]; 08/01/2018 9:54 AM) Arthritis  Mother, Sister. Breast Cancer  Family Members In General, Sister. Hypertension  Daughter, Mother, Sister, Son.  Pregnancy / Birth History ([place], [place];  Age at menarche  64 years. Age of menopause  52-50 Contraceptive History  Oral contraceptives. Gravida  2 Irregular periods  Maternal age  58-20 Para  2  Other Problems  Arthritis  Back Pain  Breast Cancer  Chronic Obstructive Lung Disease  High blood pressure  Lump In Breast     Review of Systems (Sabrina Canty CMA; 08/01/2018 9:54 AM) General Not Present- Appetite Loss, Chills, Fatigue, Fever, Night Sweats, Weight Gain and Weight Loss. Skin Present- New Lesions. Not Present- Change in Wart/Mole, Dryness, Hives, Jaundice, Non-Healing Wounds, Rash and Ulcer. HEENT Not Present- Earache, Hearing Loss, Hoarseness, Nose Bleed, Oral Ulcers, Ringing in the Ears, Seasonal Allergies, Sinus Pain, Sore Throat, Visual Disturbances, Wears glasses/contact lenses and Yellow Eyes. Respiratory Not Present- Bloody sputum, Chronic Cough, Difficulty Breathing, Snoring and Wheezing. Breast Not Present- Breast Mass, Breast Pain, Nipple Discharge and Skin Changes. Cardiovascular Not Present- Chest Pain, Difficulty Breathing Lying Down, Leg Cramps, Palpitations, Rapid Heart Rate, Shortness of Breath and Swelling of Extremities. Gastrointestinal Not Present- Abdominal Pain, Bloating, Bloody Stool, Change in Bowel  Habits, Chronic diarrhea,  Constipation, Difficulty Swallowing, Excessive gas, Gets full quickly at meals, Hemorrhoids, Indigestion, Nausea, Rectal Pain and Vomiting. Female Genitourinary Not Present- Frequency, Nocturia, Painful Urination, Pelvic Pain and Urgency. Musculoskeletal Present- Muscle Pain. Not Present- Back Pain, Joint Pain, Joint Stiffness, Muscle Weakness and Swelling of Extremities. Neurological Not Present- Decreased Memory, Fainting, Headaches, Numbness, Seizures, Tingling, Tremor, Trouble walking and Weakness. Psychiatric Not Present- Anxiety, Bipolar, Change in Sleep Pattern, Depression, Fearful and Frequent crying. Endocrine Not Present- Cold Intolerance, Excessive Hunger, Hair Changes, Heat Intolerance, Hot flashes and New Diabetes. Hematology Present- Blood Thinners and Easy Bruising. Not Present- Excessive bleeding, Gland problems, HIV and Persistent Infections.  Vitals  Weight: 163.5 lb Height: 65in Body Surface Area: 1.82 m Body Mass Index: 27.21 kg/m  Temp.: 97.64F (Temporal)  Pulse: 90 (Regular)  P.OX: 98% (Room air) BP: 118/72(Sitting, Left Arm, Standard)       Physical Exam  The physical exam findings are as follows: Note: On exam today, she is well-appearing. She has a left-sided mastectomy. There is a small opening of the lateral edge of the incision. There is no purulence and no erythema. I can see granulation tissue. There is no axillary adenopathy. She does have limited mobility of the shoulder. I treated the open wound with silver nitrate. Lungs clear CV RRR Abdomen soft, nt Ext normal    Assessment & Plan   NON-HEALING SURGICAL WOUND (T81.89XA)  Impression: The biggest concern is whether or not this could be from underlying malignancy in the skin which is either breast cancer or secondary to the radiation. This could just be a secondary skin cyst at the incision is not healing. Her biggest issue remains her anticoagulation and  cardiopulmonary status. I believe this area potentially needs excision in the operating room. The biggest issue would be her ability to heal. I will have her see Dr. Marla Roe for a plastic surgeon's opinion regarding the wound with potential wound care versus how this area will potentially be closed if excision is needed in the operating room.  Addendum:  MRI negative for gross mass Plan excision of the left chest chronic wound in the OR with Dr. Marla Roe.  We again discussed the risks of surgery in detail

## 2018-12-28 ENCOUNTER — Ambulatory Visit (HOSPITAL_COMMUNITY): Payer: Medicare Other | Admitting: Certified Registered"

## 2018-12-28 ENCOUNTER — Ambulatory Visit (HOSPITAL_COMMUNITY)
Admission: RE | Admit: 2018-12-28 | Discharge: 2018-12-28 | Disposition: A | Discharge status: Home / Self Care | Payer: Medicare Other | Attending: Surgery | Admitting: Surgery

## 2018-12-28 ENCOUNTER — Encounter (HOSPITAL_COMMUNITY)
Admission: RE | Disposition: A | Discharge status: Home / Self Care | Payer: Self-pay | Source: Home / Self Care | Attending: Surgery

## 2018-12-28 ENCOUNTER — Other Ambulatory Visit: Payer: Self-pay

## 2018-12-28 ENCOUNTER — Encounter (HOSPITAL_COMMUNITY): Payer: Self-pay | Admitting: Plastic Surgery

## 2018-12-28 ENCOUNTER — Ambulatory Visit (HOSPITAL_COMMUNITY): Payer: Medicare Other | Admitting: Physician Assistant

## 2018-12-28 DIAGNOSIS — Z853 Personal history of malignant neoplasm of breast: Secondary | ICD-10-CM | POA: Diagnosis not present

## 2018-12-28 DIAGNOSIS — I2510 Atherosclerotic heart disease of native coronary artery without angina pectoris: Secondary | ICD-10-CM | POA: Diagnosis not present

## 2018-12-28 DIAGNOSIS — S21102A Unspecified open wound of left front wall of thorax without penetration into thoracic cavity, initial encounter: Secondary | ICD-10-CM | POA: Diagnosis not present

## 2018-12-28 DIAGNOSIS — T8189XA Other complications of procedures, not elsewhere classified, initial encounter: Secondary | ICD-10-CM | POA: Diagnosis not present

## 2018-12-28 DIAGNOSIS — Z86711 Personal history of pulmonary embolism: Secondary | ICD-10-CM | POA: Diagnosis not present

## 2018-12-28 DIAGNOSIS — F17210 Nicotine dependence, cigarettes, uncomplicated: Secondary | ICD-10-CM | POA: Insufficient documentation

## 2018-12-28 DIAGNOSIS — Z885 Allergy status to narcotic agent status: Secondary | ICD-10-CM | POA: Diagnosis not present

## 2018-12-28 DIAGNOSIS — S21012A Laceration without foreign body of left breast, initial encounter: Secondary | ICD-10-CM | POA: Diagnosis not present

## 2018-12-28 DIAGNOSIS — I110 Hypertensive heart disease with heart failure: Secondary | ICD-10-CM | POA: Insufficient documentation

## 2018-12-28 DIAGNOSIS — Z7901 Long term (current) use of anticoagulants: Secondary | ICD-10-CM | POA: Insufficient documentation

## 2018-12-28 DIAGNOSIS — S21002A Unspecified open wound of left breast, initial encounter: Secondary | ICD-10-CM | POA: Diagnosis not present

## 2018-12-28 DIAGNOSIS — Z9012 Acquired absence of left breast and nipple: Secondary | ICD-10-CM | POA: Diagnosis not present

## 2018-12-28 DIAGNOSIS — Y836 Removal of other organ (partial) (total) as the cause of abnormal reaction of the patient, or of later complication, without mention of misadventure at the time of the procedure: Secondary | ICD-10-CM | POA: Insufficient documentation

## 2018-12-28 DIAGNOSIS — I509 Heart failure, unspecified: Secondary | ICD-10-CM | POA: Diagnosis not present

## 2018-12-28 DIAGNOSIS — Z792 Long term (current) use of antibiotics: Secondary | ICD-10-CM | POA: Insufficient documentation

## 2018-12-28 DIAGNOSIS — Z952 Presence of prosthetic heart valve: Secondary | ICD-10-CM | POA: Insufficient documentation

## 2018-12-28 DIAGNOSIS — J449 Chronic obstructive pulmonary disease, unspecified: Secondary | ICD-10-CM | POA: Insufficient documentation

## 2018-12-28 DIAGNOSIS — Z79899 Other long term (current) drug therapy: Secondary | ICD-10-CM | POA: Diagnosis not present

## 2018-12-28 DIAGNOSIS — E785 Hyperlipidemia, unspecified: Secondary | ICD-10-CM | POA: Diagnosis not present

## 2018-12-28 DIAGNOSIS — I5042 Chronic combined systolic (congestive) and diastolic (congestive) heart failure: Secondary | ICD-10-CM | POA: Diagnosis not present

## 2018-12-28 DIAGNOSIS — L308 Other specified dermatitis: Secondary | ICD-10-CM | POA: Diagnosis not present

## 2018-12-28 HISTORY — PX: WOUND DEBRIDEMENT: SHX247

## 2018-12-28 HISTORY — PX: DEBRIDEMENT AND CLOSURE WOUND: SHX5614

## 2018-12-28 LAB — PROTIME-INR
INR: 1.1 (ref 0.8–1.2)
Prothrombin Time: 13.9 seconds (ref 11.4–15.2)

## 2018-12-28 SURGERY — DEBRIDEMENT, WOUND
Anesthesia: General | Site: Chest | Laterality: Left

## 2018-12-28 MED ORDER — ACETAMINOPHEN 325 MG PO TABS: 650.0000 mg | ORAL_TABLET | ORAL | Status: DC | PRN

## 2018-12-28 MED ORDER — GABAPENTIN 300 MG PO CAPS
300.0000 mg | ORAL_CAPSULE | ORAL | Status: DC
  Filled 2018-12-28: qty 1

## 2018-12-28 MED ORDER — FENTANYL CITRATE (PF) 100 MCG/2ML IJ SOLN
INTRAMUSCULAR | Status: AC
  Filled 2018-12-28: qty 2

## 2018-12-28 MED ORDER — PROPOFOL 10 MG/ML IV BOLUS
INTRAVENOUS | Status: DC | PRN
  Administered 2018-12-28: 130 mg via INTRAVENOUS
  Administered 2018-12-28: 40 mg via INTRAVENOUS

## 2018-12-28 MED ORDER — BUPIVACAINE-EPINEPHRINE (PF) 0.25% -1:200000 IJ SOLN
INTRAMUSCULAR | Status: AC
  Filled 2018-12-28: qty 30

## 2018-12-28 MED ORDER — ACETAMINOPHEN 650 MG RE SUPP: 650.0000 mg | RECTAL | Status: DC | PRN

## 2018-12-28 MED ORDER — DEXAMETHASONE SODIUM PHOSPHATE 10 MG/ML IJ SOLN
INTRAMUSCULAR | Status: DC | PRN
  Administered 2018-12-28: 5 mg via INTRAVENOUS

## 2018-12-28 MED ORDER — OXYCODONE HCL 5 MG PO TABS
ORAL_TABLET | ORAL | Status: AC
  Filled 2018-12-28: qty 2

## 2018-12-28 MED ORDER — CHLORHEXIDINE GLUCONATE CLOTH 2 % EX PADS: 6.0000 | MEDICATED_PAD | Freq: Once | CUTANEOUS | Status: DC

## 2018-12-28 MED ORDER — DEXAMETHASONE SODIUM PHOSPHATE 10 MG/ML IJ SOLN
INTRAMUSCULAR | Status: AC
  Filled 2018-12-28: qty 1

## 2018-12-28 MED ORDER — PROMETHAZINE HCL 25 MG/ML IJ SOLN: 6.2500 mg | INTRAMUSCULAR | Status: DC | PRN

## 2018-12-28 MED ORDER — HYDROCODONE-ACETAMINOPHEN 5-325 MG PO TABS: 1.0000 | ORAL_TABLET | Freq: Four times a day (QID) | ORAL | 0 refills | Status: DC | PRN

## 2018-12-28 MED ORDER — CEFAZOLIN SODIUM-DEXTROSE 2-4 GM/100ML-% IV SOLN
2.0000 g | INTRAVENOUS | Status: AC
  Administered 2018-12-28: 2 g via INTRAVENOUS
  Filled 2018-12-28: qty 100

## 2018-12-28 MED ORDER — 0.9 % SODIUM CHLORIDE (POUR BTL) OPTIME
TOPICAL | Status: DC | PRN
  Administered 2018-12-28: 1000 mL

## 2018-12-28 MED ORDER — FENTANYL CITRATE (PF) 250 MCG/5ML IJ SOLN
INTRAMUSCULAR | Status: AC
  Filled 2018-12-28: qty 5

## 2018-12-28 MED ORDER — FENTANYL CITRATE (PF) 100 MCG/2ML IJ SOLN
25.0000 ug | INTRAMUSCULAR | Status: DC | PRN
  Administered 2018-12-28 (×3): 50 ug via INTRAVENOUS

## 2018-12-28 MED ORDER — OXYCODONE HCL 5 MG PO TABS
5.0000 mg | ORAL_TABLET | ORAL | Status: DC | PRN
  Administered 2018-12-28: 10 mg via ORAL

## 2018-12-28 MED ORDER — ACETAMINOPHEN 500 MG PO TABS
1000.0000 mg | ORAL_TABLET | ORAL | Status: AC
  Administered 2018-12-28: 1000 mg via ORAL
  Filled 2018-12-28: qty 2

## 2018-12-28 MED ORDER — BUPIVACAINE-EPINEPHRINE 0.25% -1:200000 IJ SOLN
INTRAMUSCULAR | Status: DC | PRN
  Administered 2018-12-28: 20 mL

## 2018-12-28 MED ORDER — ROCURONIUM BROMIDE 10 MG/ML (PF) SYRINGE
PREFILLED_SYRINGE | INTRAVENOUS | Status: DC | PRN
  Administered 2018-12-28: 40 mg via INTRAVENOUS

## 2018-12-28 MED ORDER — SODIUM CHLORIDE 0.9% FLUSH: 3.0000 mL | INTRAVENOUS | Status: DC | PRN

## 2018-12-28 MED ORDER — LIDOCAINE 2% (20 MG/ML) 5 ML SYRINGE
INTRAMUSCULAR | Status: DC | PRN
  Administered 2018-12-28: 60 mg via INTRAVENOUS

## 2018-12-28 MED ORDER — FENTANYL CITRATE (PF) 250 MCG/5ML IJ SOLN
INTRAMUSCULAR | Status: DC | PRN
  Administered 2018-12-28: 50 ug via INTRAVENOUS
  Administered 2018-12-28: 100 ug via INTRAVENOUS

## 2018-12-28 MED ORDER — SODIUM CHLORIDE 0.9% FLUSH: 3.0000 mL | Freq: Two times a day (BID) | INTRAVENOUS | Status: DC

## 2018-12-28 MED ORDER — SUGAMMADEX SODIUM 200 MG/2ML IV SOLN
INTRAVENOUS | Status: DC | PRN
  Administered 2018-12-28: 200 mg via INTRAVENOUS

## 2018-12-28 MED ORDER — CEFAZOLIN SODIUM-DEXTROSE 2-4 GM/100ML-% IV SOLN: 2.0000 g | INTRAVENOUS | Status: DC

## 2018-12-28 MED ORDER — PROPOFOL 10 MG/ML IV BOLUS
INTRAVENOUS | Status: AC
  Filled 2018-12-28: qty 20

## 2018-12-28 MED ORDER — SODIUM CHLORIDE 0.9 % IV SOLN: 250.0000 mL | INTRAVENOUS | Status: DC | PRN

## 2018-12-28 MED ORDER — ONDANSETRON HCL 4 MG/2ML IJ SOLN
INTRAMUSCULAR | Status: DC | PRN
  Administered 2018-12-28: 4 mg via INTRAVENOUS

## 2018-12-28 MED ORDER — LACTATED RINGERS IV SOLN
INTRAVENOUS | Status: DC | PRN
  Administered 2018-12-28: 08:00:00 via INTRAVENOUS

## 2018-12-28 SURGICAL SUPPLY — 37 items
BINDER BREAST LRG (GAUZE/BANDAGES/DRESSINGS) ×2 IMPLANT
BNDG GAUZE ELAST 4 BULKY (GAUZE/BANDAGES/DRESSINGS) IMPLANT
CANISTER SUCT 3000ML PPV (MISCELLANEOUS) ×3 IMPLANT
COVER SURGICAL LIGHT HANDLE (MISCELLANEOUS) ×3 IMPLANT
COVER WAND RF STERILE (DRAPES) ×3 IMPLANT
DERMABOND ADVANCED (GAUZE/BANDAGES/DRESSINGS) ×2
DERMABOND ADVANCED .7 DNX12 (GAUZE/BANDAGES/DRESSINGS) IMPLANT
DRAPE LAPAROSCOPIC ABDOMINAL (DRAPES) ×3 IMPLANT
DRSG MEPILEX BORDER 4X8 (GAUZE/BANDAGES/DRESSINGS) ×2 IMPLANT
DRSG PAD ABDOMINAL 8X10 ST (GAUZE/BANDAGES/DRESSINGS) ×2 IMPLANT
ELECT REM PT RETURN 9FT ADLT (ELECTROSURGICAL) ×3
ELECTRODE REM PT RTRN 9FT ADLT (ELECTROSURGICAL) ×1 IMPLANT
GAUZE SPONGE 4X4 12PLY STRL (GAUZE/BANDAGES/DRESSINGS) IMPLANT
GLOVE BIO SURGEON STRL SZ 6 (GLOVE) ×2 IMPLANT
GLOVE BIO SURGEON STRL SZ 6.5 (GLOVE) ×1 IMPLANT
GLOVE BIO SURGEON STRL SZ7.5 (GLOVE) ×2 IMPLANT
GLOVE BIO SURGEONS STRL SZ 6.5 (GLOVE) ×1
GLOVE SURG SIGNA 7.5 PF LTX (GLOVE) ×3 IMPLANT
GOWN STRL REUS W/ TWL LRG LVL3 (GOWN DISPOSABLE) ×1 IMPLANT
GOWN STRL REUS W/ TWL XL LVL3 (GOWN DISPOSABLE) ×1 IMPLANT
GOWN STRL REUS W/TWL LRG LVL3 (GOWN DISPOSABLE) ×8
GOWN STRL REUS W/TWL XL LVL3 (GOWN DISPOSABLE) ×2
KIT BASIN OR (CUSTOM PROCEDURE TRAY) ×3 IMPLANT
KIT TURNOVER KIT B (KITS) ×3 IMPLANT
NS IRRIG 1000ML POUR BTL (IV SOLUTION) ×3 IMPLANT
PACK GENERAL/GYN (CUSTOM PROCEDURE TRAY) ×3 IMPLANT
PAD ARMBOARD 7.5X6 YLW CONV (MISCELLANEOUS) ×6 IMPLANT
PENCIL SMOKE EVACUATOR (MISCELLANEOUS) ×3 IMPLANT
SPECIMEN JAR SMALL (MISCELLANEOUS) ×2 IMPLANT
SUT MNCRL AB 3-0 PS2 18 (SUTURE) ×2 IMPLANT
SUT MNCRL AB 4-0 PS2 18 (SUTURE) ×2 IMPLANT
SUT MON AB 5-0 PS2 18 (SUTURE) ×4 IMPLANT
SUT SILK 2 0 SH (SUTURE) ×2 IMPLANT
SWAB COLLECTION DEVICE MRSA (MISCELLANEOUS) IMPLANT
SWAB CULTURE ESWAB REG 1ML (MISCELLANEOUS) IMPLANT
TOWEL GREEN STERILE (TOWEL DISPOSABLE) ×3 IMPLANT
TOWEL GREEN STERILE FF (TOWEL DISPOSABLE) ×3 IMPLANT

## 2018-12-28 NOTE — Addendum Note (Signed)
Addended by: Wallace Going on: 12/28/2018 11:29 AM   Modules accepted: Orders

## 2018-12-28 NOTE — Op Note (Signed)
DATE OF OPERATION: 12/28/2018  LOCATION: [facility]  PREOPERATIVE DIAGNOSIS: left chest / breast wound with history of breast cancer  POSTOPERATIVE DIAGNOSIS: Same  PROCEDURE: Complex closure of left chest / breast wound 5 x 10 cm.  SURGEON:  Ashkan Chamberland Sanger Hykeem Ojeda, DO Dr. Nedra Hai, MD  ASSISTANT: [place], RNFA  EBL: 5 cc  CONDITION: Stable  COMPLICATIONS: None  INDICATION: The patient, Kim Anthony, is a 68 y.o. female born on 1951-03-13, is here for treatment of a left chest / breast wound.  She underwent a mastectomy greater than 30 years ago and then noticed a nonhealing wound.  She underwent a workup that was negative.  The concern was for possible recurance of breast cancer and the wound.   The decision was made for the excision and then closure.          PROCEDURE DETAILS:  The patient was seen prior to surgery and marked by myself and Dr. Ninfa Linden.  The IV antibiotics were given. The patient was taken to the operating room and given a general anesthetic. A standard time out was performed and all information was confirmed by those in the room. SCDs were placed.   The patient was prepped and draped.  Local was injected and general surgery performed the excision.  The resulting wound was 5 x 10 cm.  There was extreme tightness of the skin and scaring of the soft tissue making shoulder abduction difficult.  The scar and banding was released with the bovie for 8 cm around the opening to decrease tension and improve tissue pliability.  This was very nice at decreasing the tension once the wound was closed. The 3-0 Monocryl was used to close the deep layers of the soft tissue.  The 4-0 and 5-0  Monocryl was used to close the final layers.  Derma bond was applied.  A breast binder and ABD was applied.  The patient was allowed to wake up and taken to recovery room in stable condition at the end of the case. The family was notified at the end of the case.    The RNFA assisted throughout the case.  The RNFA was essential in retraction and counter traction when needed to make the case progress smoothly.  This retraction and assistance made it possible to see the tissue plans for the procedure.  The assistance was needed for blood control, tissue re-approximation and assisted with closure of the incision site.

## 2018-12-28 NOTE — Interval H&P Note (Signed)
History and Physical Interval Note: no change in H and P  12/28/2018 7:49 AM  Kim Anthony  has presented today for surgery, with the diagnosis of CHRONIC LEFT CHEST WALL WOUND.  The various methods of treatment have been discussed with the patient and family. After consideration of risks, benefits and other options for treatment, the patient has consented to  Procedure(s): EXCISION OF LEFT CHRONIC CHEST WALL WOUND (Left) as a surgical intervention.  The patient's history has been reviewed, patient examined, no change in status, stable for surgery.  I have reviewed the patient's chart and labs.  Questions were answered to the patient's satisfaction.     Coralie Keens

## 2018-12-28 NOTE — Op Note (Signed)
EXCISION OF LEFT CHRONIC CHEST WALL WOUND  Procedure Note  Kim Anthony 12/28/2018   Pre-op Diagnosis: CHRONIC LEFT CHEST WALL WOUND     Post-op Diagnosis: same  Procedure(s): EXCISION OF LEFT CHRONIC CHEST WALL WOUND  Surgeon(s): Dillingham, Loel Lofty, DO Coralie Keens, MD  Anesthesia: General  Staff:  Circulator: Hal Morales, RN Scrub Person: Lovett Sox, CST Circulator Assistant: Gar Ponto, RN RN First Assistant: [place], RN  Estimated Blood Loss: Minimal               Specimens: sent to path  Indications: [name] with a previous history of left breast cancer who had undergone a mastectomy as well as radiation over 30 years ago.  She has developed a nonhealing wound of the lateral aspect of the mastectomy incision.  Preoperative imaging studies did not show any gross recurrent cancer.  She was referred also to plastic surgery with the decision to excise the wound by general surgery with plastic surgery performing the closure  Procedure: The patient was brought to the operating room and identifies correct patient.  She is placed upon the operating table and general anesthesia was induced.  Her left chest was then prepped and draped in usual sterile fashion.  We anesthetized the skin around the small open wound with Marcaine.  A wide elliptical incision was then performed with a scalpel incorporating the small wound and part of the more medial scar which is been intermittently opening as well.  The dissection was then taken down through the skin cutaneous tissue to the chest wall muscle with the cautery.  I then completely excised the rest of the tissue underneath the chronic wound with the cautery.  I then marked the specimen at the clock position with a silk suture and it was sent to pathology.  At this point, the procedure was turned over to Dr. Marla Roe for the complex closure.          Coralie Keens   Date: 12/28/2018  Time: 8:40  AM

## 2018-12-28 NOTE — Anesthesia Procedure Notes (Signed)
Procedure Name: Intubation Date/Time: 12/28/2018 8:23 AM Performed by: Myna Bright, CRNA Pre-anesthesia Checklist: Patient identified, Emergency Drugs available, Suction available and Patient being monitored Patient Re-evaluated:Patient Re-evaluated prior to induction Oxygen Delivery Method: Circle system utilized Preoxygenation: Pre-oxygenation with 100% oxygen Induction Type: IV induction Ventilation: Mask ventilation without difficulty Laryngoscope Size: Mac and 3 Grade View: Grade I Tube type: Oral Tube size: 7.0 mm Number of attempts: 1 Airway Equipment and Method: Stylet Placement Confirmation: ETT inserted through vocal cords under direct vision,  positive ETCO2 and breath sounds checked- equal and bilateral Secured at: 21 cm Tube secured with: Tape Dental Injury: Teeth and Oropharynx as per pre-operative assessment

## 2018-12-28 NOTE — Discharge Instructions (Signed)
INSTRUCTIONS FOR AFTER SURGERY   You are having surgery.  You will likely have some questions about what to expect following your operation.  The following information will help you and your family understand what to expect when you are discharged from the hospital.  Following these guidelines will help ensure a smooth recovery and reduce risks of complications.   Postoperative instructions include information on: diet, wound care, medications and physical activity.  AFTER SURGERY Expect to go home after the procedure.  In some cases, you may need to spend one night in the hospital for observation.  DIET This surgery does not require a specific diet.  However, I have to mention that the healthier you eat the better your body can start healing. It is important to increasing your protein intake.  This means limiting the foods with sugar and carbohydrates.  Focus on vegetables and some meat.  If you have any liposuction during your procedure be sure to drink water.  If your urine is bright yellow, then it is concentrated, and you need to drink more water.  As a general rule after surgery, you should have 8 ounces of water every hour while awake.  If you find you are persistently nauseated or unable to take in liquids let us know.  NO TOBACCO USE or EXPOSURE.  This will slow your healing process and increase the risk of a wound.  WOUND CARE If you don't have a drain: You can shower the day after surgery.  Use fragrance free soap.  Dial, [place] and [place] are usually mild on the skin. If you have a drain: Clean with baby wipes until the drain is removed.   If you have steri-strips / tape directly attached to your skin leave them in place. It is OK to get these wet.  No baths, pools or hot tubs for two weeks. We close your incision to leave the smallest and best-looking scar. No ointment or creams on your incisions until given the go ahead.  Especially not Neosporin (Too many skin reactions with this one).  A  few weeks after surgery you can use Mederma and start massaging the scar. We ask you to wear your binder or sports bra for the first 6 weeks around the clock, including while sleeping. This provides added comfort and helps reduce the fluid accumulation at the surgery site.  ACTIVITY No heavy lifting until cleared by the doctor.  It is OK to walk and climb stairs. In fact, moving your legs is very important to decrease your risk of a blood clot.  It will also help keep you from getting deconditioned.  Every 1 to 2 hours get up and walk for 5 minutes. This will help with a quicker recovery back to normal.  Let pain be your guide so you don't do too much.  NO, you cannot do the spring cleaning and don't plan on taking care of anyone else.  This is your time for TLC.  You will be more comfortable if you sleep and rest with your head elevated either with a few pillows under you or in a recliner.  No stomach sleeping for a few months.  WORK Everyone returns to work at different times. As a rough guide, most people take at least 1 - 2 weeks off prior to returning to work. If you need documentation for your job, bring the forms to your postoperative follow up visit.  DRIVING Arrange for someone to bring you home from the hospital.  You may be able to drive a few days after surgery but not while taking any narcotics or valium.  BOWEL MOVEMENTS Constipation can occur after anesthesia and while taking pain medication.  It is important to stay ahead for your comfort.  We recommend taking Milk of Magnesia (2 tablespoons; twice a day) while taking the pain pills.  SEROMA This is fluid your body tried to put in the surgical site.  This is normal but if it creates tight skinny skin let us know.  It usually decreases in a few weeks.  WHEN TO CALL Call your surgeon's office if any of the following occur:  Fever 101 degrees F or greater  Excessive bleeding or fluid from the incision site.  Pain that increases  over time without aid from the medications  Redness, warmth, or pus draining from incision sites  Persistent nausea or inability to take in liquids  Severe misshapen area that underwent the operation.

## 2018-12-28 NOTE — Anesthesia Postprocedure Evaluation (Signed)
Anesthesia Post Note  Patient: Tiondra Fang  Procedure(s) Performed: EXCISION OF LEFT CHRONIC CHEST WALL WOUND (Left Chest) Debridement And Closure Wound (Left Chest)     Patient location during evaluation: PACU Anesthesia Type: General Level of consciousness: awake and alert Pain management: pain level controlled Vital Signs Assessment: post-procedure vital signs reviewed and stable Respiratory status: spontaneous breathing, nonlabored ventilation, respiratory function stable and patient connected to nasal cannula oxygen Cardiovascular status: blood pressure returned to baseline and stable Postop Assessment: no apparent nausea or vomiting Anesthetic complications: no    Last Vitals:  Vitals:   12/28/18 1030 12/28/18 1046  BP: (!) 143/85 117/75  Pulse: 80 80  Resp: 16 20  Temp: (!) 36.3 C   SpO2: 92% 93%    Last Pain:  Vitals:   12/28/18 0955  TempSrc:   PainSc: 9                  Tiajuana Amass

## 2018-12-28 NOTE — Interval H&P Note (Signed)
History and Physical Interval Note:  12/28/2018 7:38 AM  Kim Anthony  has presented today for surgery, with the diagnosis of CHRONIC LEFT CHEST WALL WOUND.  The various methods of treatment have been discussed with the patient and family. After consideration of risks, benefits and other options for treatment, the patient has consented to  Procedure(s): EXCISION OF LEFT CHRONIC CHEST WALL WOUND (Left) as a surgical intervention.  The patient's history has been reviewed, patient examined, no change in status, stable for surgery.  I have reviewed the patient's chart and labs.  Questions were answered to the patient's satisfaction.     Loel Lofty [name]

## 2018-12-28 NOTE — Transfer of Care (Signed)
Immediate Anesthesia Transfer of Care Note  Patient: Kim Anthony  Procedure(s) Performed: EXCISION OF LEFT CHRONIC CHEST WALL WOUND (Left Chest) Debridement And Closure Wound (Left Chest)  Patient Location: PACU  Anesthesia Type:General  Level of Consciousness: awake, alert , oriented and patient cooperative  Airway & Oxygen Therapy: Patient Spontanous Breathing and Patient connected to nasal cannula oxygen  Post-op Assessment: Report given to RN, Post -op Vital signs reviewed and stable and Patient moving all extremities  Post vital signs: Reviewed and stable  Last Vitals:  Vitals Value Taken Time  BP 126/87 12/28/18 0927  Temp    Pulse 82 12/28/18 0927  Resp 16 12/28/18 0927  SpO2 93 % 12/28/18 0927  Vitals shown include unvalidated device data.  Last Pain:  Vitals:   12/28/18 0710  TempSrc: Oral         Complications: No apparent anesthesia complications

## 2018-12-29 ENCOUNTER — Telehealth: Payer: Self-pay | Admitting: *Deleted

## 2018-12-29 ENCOUNTER — Telehealth: Payer: Self-pay | Admitting: Pharmacist

## 2018-12-29 ENCOUNTER — Encounter (HOSPITAL_COMMUNITY): Payer: Self-pay | Admitting: Surgery

## 2018-12-29 DIAGNOSIS — Z4801 Encounter for change or removal of surgical wound dressing: Secondary | ICD-10-CM | POA: Diagnosis not present

## 2018-12-29 DIAGNOSIS — Z4789 Encounter for other orthopedic aftercare: Secondary | ICD-10-CM | POA: Diagnosis not present

## 2018-12-29 NOTE — Telephone Encounter (Signed)
Received text from patient's daughter. Mom was discharged s/p surgery 28-Dec-2018. Seeking instructions regarding recommencing anticoagulation.Daughter was instructed to recommence enoxaparin/Lovenox at 1.5mg /kg SQ q24h (120mg ) + warfarin, 5mg  daily except on Wednesdays and Sundays (last regimen she was on resulting in therapeutic INR)--on Wednesdays and Sundays, take 1 and 1/2 x 5mg  [7.5mg  dose]). Patient self-testing, point of care, fingerstick INR to be performed on Monday 02-Jan-2019.

## 2018-12-29 NOTE — Telephone Encounter (Addendum)
Received call from [facility] with [facility] regarding wound care for the breast wound.  She stated the area has an foam gauze on it and the area is completely closed.  She wanted to know if she could use a ABD pad on the area in case of possible drainage.  Inform Kim Anthony it would be okay to use the ABD pad on the area, and I will let Dr. Marla Roe know.  She verbalized understanding and agreed.//AB/CMA

## 2018-12-30 ENCOUNTER — Telehealth: Payer: Self-pay | Admitting: Plastic Surgery

## 2018-12-30 ENCOUNTER — Other Ambulatory Visit: Payer: Self-pay | Admitting: *Deleted

## 2018-12-30 DIAGNOSIS — Z952 Presence of prosthetic heart valve: Secondary | ICD-10-CM

## 2018-12-30 DIAGNOSIS — I2699 Other pulmonary embolism without acute cor pulmonale: Secondary | ICD-10-CM

## 2018-12-30 DIAGNOSIS — Z7901 Long term (current) use of anticoagulants: Secondary | ICD-10-CM

## 2018-12-30 NOTE — Telephone Encounter (Signed)
Called and spoke with the patient and she stated that the pain medication is not working.  She's having to take 2 of the Norco for the pain.//AB/CMA

## 2018-12-30 NOTE — Telephone Encounter (Signed)
Called the patient and informed her that I spoke with Dr. Marla Roe and she said she will try to call in Peracet for her.  I informed her that the pharmacy may question the new prescription because she has already picked up the [place].  She stated that because she's having to take 2 she will run out sooner.  Patient verbalized understanding and agreed.//AB/CMA

## 2018-12-30 NOTE — Telephone Encounter (Signed)
Received call from patient wanting to speak with clinical staff regarding her prescription. Contact [phone].

## 2019-01-02 ENCOUNTER — Telehealth: Payer: Self-pay | Admitting: Pharmacist

## 2019-01-02 ENCOUNTER — Telehealth: Payer: Self-pay | Admitting: *Deleted

## 2019-01-02 ENCOUNTER — Telehealth: Payer: Self-pay | Admitting: Plastic Surgery

## 2019-01-02 DIAGNOSIS — I2782 Chronic pulmonary embolism: Secondary | ICD-10-CM | POA: Diagnosis not present

## 2019-01-02 DIAGNOSIS — Z952 Presence of prosthetic heart valve: Secondary | ICD-10-CM | POA: Diagnosis not present

## 2019-01-02 DIAGNOSIS — Z7901 Long term (current) use of anticoagulants: Secondary | ICD-10-CM | POA: Diagnosis not present

## 2019-01-02 MED ORDER — HYDROCODONE-ACETAMINOPHEN 5-325 MG PO TABS: 1.0000 | ORAL_TABLET | Freq: Three times a day (TID) | ORAL | 0 refills | Status: DC | PRN

## 2019-01-02 NOTE — Telephone Encounter (Signed)
Received call from patient requesting update on Percocet prescription. Contact [phone].

## 2019-01-02 NOTE — Telephone Encounter (Signed)
Received fax and phone call from [place] with INR results from 10/20/2018 to today. Today's INR is 1.1. Fax placed in Dr. Gladstone Pih office. Also, since Dr. Donne Hazel no longer at [facility] they are requiring new PCP's info written on letterhead and faxed to them at [phone]. Hubbard Hartshorn, RN, BSN

## 2019-01-02 NOTE — Telephone Encounter (Signed)
I agree

## 2019-01-02 NOTE — Telephone Encounter (Signed)
Patient called back requesting update on Percocet prescription. Informed patient that Dr. Marla Roe is currently in surgery and we will update her as soon as possible.

## 2019-01-02 NOTE — Telephone Encounter (Signed)
Received text from daughter. PST FS POC INR 1.1 after re-starting warfarin on 29-Dec-2018 with LMWH 1.5mg /kg SQ q24h "Bridge" s/p surgery on 28-Dec-2018. Advised MUST CONTINUE 120mg  SQ q24 Lovenox and increase warfarin to 1 & 1/2 x 5mg  (7.5mg  dose) for Monday 02-Jan-2019, Tuesday 03-Jan-2019; Wednesday 04-Jan-2019; on Thursday 05-Jan-2019, take only 1x5mg  warfarin (all this time--continuing your Lovenox). INR on Friday 06-Jan-2019. Text me results. Patient has follow up visit with her Surgeon on Friday 06-Jan-2019.  Printed instructions were photographed and sent securely by text to daughter.

## 2019-01-03 ENCOUNTER — Telehealth: Payer: Self-pay | Admitting: Plastic Surgery

## 2019-01-03 DIAGNOSIS — Z4801 Encounter for change or removal of surgical wound dressing: Secondary | ICD-10-CM | POA: Diagnosis not present

## 2019-01-03 DIAGNOSIS — Z4789 Encounter for other orthopedic aftercare: Secondary | ICD-10-CM | POA: Diagnosis not present

## 2019-01-03 MED ORDER — DIAZEPAM 5 MG PO TABS: 5.0000 mg | ORAL_TABLET | Freq: Two times a day (BID) | ORAL | 1 refills | Status: DC | PRN

## 2019-01-03 NOTE — Telephone Encounter (Signed)
Received a call from [employer] with [facility]. Spoke with Kim Anthony and Olivia Mackie RN at the same time while Olivia Mackie was doing a home visit with Kim Anthony. Olivia Mackie is the home nurse that has been helping Mrs. Debow.  Kim Anthony stated she had had some left upper arm pain, 8/10.  Kim Anthony noted that the pain did not resolve with taking extra doses of Norco.  She was called in a second prescription due to the pain yesterday 01/02/2019 and reportedly has 8-1/2 pills left. Kim Anthony was counseled on taking the prescribed Norco every 6 to 8 hours and to avoid changing the dosage or frequency. Due to the tightness of her left pectoralis major muscle, it may be helpful for her to try some diazepam at night to help provide some muscle release and decreased tension.  Throughout the day she can take Tylenol for the pain and Norco as needed for breakthrough pain.  Take the Norco at night to help with sleep.  Kim Anthony should avoid driving while taking the Norco or Valium.  Called Kim Anthony as well to inform her of the prescription sent in for her. She understands not to drive with the norco or valium and understands the dosing for valium.

## 2019-01-03 NOTE — Telephone Encounter (Signed)
Received call from [place] with [facility]. Gwinda Passe is requesting verbal orders to continue PT twice a week for 3 weeks. Gwinda Passe also wants to know the patients restrictions for passive and active range of motion due to her recent surgery. Best call back number is [phone].

## 2019-01-04 ENCOUNTER — Telehealth: Payer: Self-pay | Admitting: Plastic Surgery

## 2019-01-04 DIAGNOSIS — Z4801 Encounter for change or removal of surgical wound dressing: Secondary | ICD-10-CM | POA: Diagnosis not present

## 2019-01-04 DIAGNOSIS — Z4789 Encounter for other orthopedic aftercare: Secondary | ICD-10-CM | POA: Diagnosis not present

## 2019-01-04 NOTE — Telephone Encounter (Signed)
Called and [facility] @ 9:22am and 12:49pm @ [phone]) asking [place]y,PT to RTC regarding the message below.//AB/CMA

## 2019-01-04 NOTE — Telephone Encounter (Signed)
Received a call back from Betsy,PT with [facility] regarding the message below.  Informed her that I spoke with [facility] and he spoke and he spoke with Dr. Marla Roe and verbal orders were given to continue PT twice a week for 3 weeks.  With no restrictions for passive and active range of motion unless the patient is having pain.  Patient is not to do any heavy lifting.  No more than a half gallon of milk.  Betsy,PT verbalized understanding and agreed.//AB/CMA

## 2019-01-04 NOTE — Telephone Encounter (Addendum)
[name],PA called and spoke with the patient on (01/03/19), regarding the message below, and a prescription for Valium was sent to the pharmacy.//AB/CMA

## 2019-01-04 NOTE — Telephone Encounter (Signed)
Received call from Will S, OT with [facility].Will is requesting orders for the patient to continue to be seen by occupational therapy. Requesting 2 times a week for 4 more weeks. Will can be reached at [phone]

## 2019-01-05 ENCOUNTER — Telehealth: Payer: Self-pay | Admitting: Plastic Surgery

## 2019-01-05 DIAGNOSIS — Z4801 Encounter for change or removal of surgical wound dressing: Secondary | ICD-10-CM | POA: Diagnosis not present

## 2019-01-05 DIAGNOSIS — Z4789 Encounter for other orthopedic aftercare: Secondary | ICD-10-CM | POA: Diagnosis not present

## 2019-01-05 NOTE — Telephone Encounter (Signed)
Called and spoke with Tracie with [facility] regarding the message below.  She stated she wanted to give [place] a update on the wound under the patient's (L) arm.  She said the wound had a lot of drainage.  The  ABD pad under the arm was soaked.  She said she believes when the patient washed up this morning she may have rubbed the area to hard.  She placed a gauze dressing then added a folded a ABD pad for compassion under the arm.  She stated that she informed the patient if the blood soaks through the ABD pad to call the office back or go to the ED.//AB/CMA

## 2019-01-05 NOTE — Telephone Encounter (Signed)
Received call from Tracie from [facility] informing us that the patient has an open wound with significant drainage. She would like a call back at [phone].

## 2019-01-05 NOTE — Telephone Encounter (Signed)

## 2019-01-06 ENCOUNTER — Ambulatory Visit (INDEPENDENT_AMBULATORY_CARE_PROVIDER_SITE_OTHER): Payer: Medicare Other | Admitting: Plastic Surgery

## 2019-01-06 ENCOUNTER — Encounter: Payer: Self-pay | Admitting: Plastic Surgery

## 2019-01-06 ENCOUNTER — Telehealth: Payer: Self-pay | Admitting: Pharmacist

## 2019-01-06 ENCOUNTER — Other Ambulatory Visit: Payer: Self-pay

## 2019-01-06 VITALS — BP 119/72 | HR 88 | Temp 98.2°F | Ht 65.0 in | Wt 168.0 lb

## 2019-01-06 DIAGNOSIS — Z4801 Encounter for change or removal of surgical wound dressing: Secondary | ICD-10-CM | POA: Diagnosis not present

## 2019-01-06 DIAGNOSIS — S41102D Unspecified open wound of left upper arm, subsequent encounter: Secondary | ICD-10-CM

## 2019-01-06 DIAGNOSIS — Z4789 Encounter for other orthopedic aftercare: Secondary | ICD-10-CM | POA: Diagnosis not present

## 2019-01-06 MED ORDER — HYDROCODONE-ACETAMINOPHEN 5-325 MG PO TABS: 1.0000 | ORAL_TABLET | Freq: Two times a day (BID) | ORAL | 0 refills | Status: DC | PRN

## 2019-01-06 MED ORDER — CEPHALEXIN 500 MG PO CAPS: 500.0000 mg | ORAL_CAPSULE | Freq: Four times a day (QID) | ORAL | 0 refills | Status: AC

## 2019-01-06 MED ORDER — FLUCONAZOLE 150 MG PO TABS: 150.0000 mg | ORAL_TABLET | Freq: Once | ORAL | 0 refills | Status: AC

## 2019-01-06 NOTE — Telephone Encounter (Signed)
Called and spoke with Will S,OT with [facility] regarding the message below.  Gave him verbal orders per Dr. Marla Roe for the patient to continue to be seen by OT 2 times a week for 4 more weeks.  Will S,OT verbalized understanding and agreed.//AB/CMA

## 2019-01-06 NOTE — Addendum Note (Signed)
Addended by: Wallace Going on: 01/06/2019 12:45 PM   Modules accepted: Orders

## 2019-01-06 NOTE — Progress Notes (Signed)
Patient ID: Kim Anthony, female    DOB: Nov 06, 1950, 68 y.o.   [name]: [id number]   Chief Complaint  Patient presents with  . Post-op Follow-up    for wound of (L) axillary    The patient is a 68 year old female here with her daughter for follow-up on her left breast surgery.  She had a wound that was concerning for repeat cancer.  Fortunately the pathology came back negative.  She has significant swelling.  Little bit of breakdown of the skin at the lateral portion.  Not surprised.  With her history of radiation she is going to be prone to skin breakdown.  She may require a latissimus muscle flap in the end.  But we will continue to monitor for now.   Left arm Upper arm circumference: 36 cm Superior to elbow: 28 cm Inferior to elbow: 24 cm   Review of Systems  Constitutional: Positive for activity change. Negative for appetite change.  Respiratory: Negative.   Genitourinary: Negative.   Skin: Positive for color change and wound.  Psychiatric/Behavioral: Negative.     Past Medical History:  Diagnosis Date  . Asthma   . Breast cancer ([facility])    Left Breast Cancer  . CHF (congestive heart failure) ([place])   . COPD (chronic obstructive pulmonary disease) ([place])   . Coronary artery disease   . History of mitral valve replacement with mechanical valve   . HLD (hyperlipidemia)   . Hypertension   . Pulmonary embolism [facility])     Past Surgical History:  Procedure Laterality Date  . COLONOSCOPY WITH PROPOFOL N/A 06/07/2018   Procedure: COLONOSCOPY WITH PROPOFOL;  Surgeon: Wilford Corner, MD;  Location: [facility];  Service: Endoscopy;  Laterality: N/A;  . DEBRIDEMENT AND CLOSURE WOUND Left 12/28/2018   Procedure: Debridement And Closure Wound;  Surgeon: Coralie Keens, MD;  Location: [place];  Service: General;  Laterality: Left;  [name] MASTECTOMY Left 2000s   for breast cancer, also s/p radiation and chemo  . MECHANICAL AORTIC AND MITRAL VALVE REPLACEMENT    . POLYPECTOMY  06/07/2018    Procedure: POLYPECTOMY;  Surgeon: Wilford Corner, MD;  Location: [facility];  Service: Endoscopy;;  . WOUND DEBRIDEMENT Left 12/28/2018   Procedure: EXCISION OF LEFT CHRONIC CHEST WALL WOUND;  Surgeon: Coralie Keens, MD;  Location: [place];  Service: General;  Laterality: Left;      Current Outpatient Medications:  .  albuterol (PROVENTIL) (2.5 MG/3ML) 0.083% nebulizer solution, Take 3 mLs (2.5 mg total) by nebulization every 6 (six) hours as needed for wheezing or shortness of breath., Disp: 75 mL, Rfl: 12 .  albuterol (VENTOLIN HFA) 108 (90 Base) MCG/ACT inhaler, Inhale 1-2 puffs into the lungs every 6 (six) hours as needed for wheezing or shortness of breath., Disp: 6.7 g, Rfl: 2 .  ANORO ELLIPTA 62.5-25 MCG/INH AEPB, Inhale 1 puff by mouth once daily (Patient taking differently: Inhale 1 puff into the lungs daily. ), Disp: 60 each, Rfl: 11 .  Calcium-Phosphorus-Vitamin D (CITRACAL +D3 PO), Take 1 tablet by mouth 2 (two) times a day., Disp: , Rfl:  .  diazepam (VALIUM) 5 MG tablet, Take 1 tablet (5 mg total) by mouth every 12 (twelve) hours as needed for muscle spasms., Disp: 30 tablet, Rfl: 1 .  fluticasone (FLONASE) 50 MCG/ACT nasal spray, Place 2 sprays into both nostrils daily. (Patient taking differently: Place 1 spray into both nostrils daily as needed for allergies. ), Disp: 16 g, Rfl: 2 .  furosemide (  LASIX) 40 MG tablet, Take 1 tablet (40 mg total) by mouth daily., Disp: 90 tablet, Rfl: 0 .  gabapentin (NEURONTIN) 600 MG tablet, Take 1 tablet (600 mg total) by mouth 2 (two) times daily., Disp: 60 tablet, Rfl: 4 .  HYDROcodone-acetaminophen (NORCO) 5-325 MG tablet, Take 1 tablet by mouth every 8 (eight) hours as needed for up to 5 days for moderate pain., Disp: 15 tablet, Rfl: 0 .  lisinopril (ZESTRIL) 10 MG tablet, Take 1 tablet (10 mg total) by mouth at bedtime., Disp: 90 tablet, Rfl: 3 .  metoprolol succinate (TOPROL-XL) 25 MG 24 hr tablet, TAKE 1/2 (ONE-HALF) TABLET BY MOUTH  ONCE DAILY (Patient taking differently: Take 12.5 mg by mouth daily. ), Disp: 45 tablet, Rfl: 0 .  potassium chloride SA (K-DUR) 20 MEQ tablet, Take 2 tablets (40 mEq total) by mouth daily., Disp: 180 tablet, Rfl: 0 .  simvastatin (ZOCOR) 20 MG tablet, Take 1 tablet by mouth once daily (Patient taking differently: Take 20 mg by mouth daily. ), Disp: 90 tablet, Rfl: 0 .  warfarin (COUMADIN) 5 MG tablet, Take 1 tablet (5 mg total) by mouth daily. (Patient taking differently: Take 5-7.5 mg by mouth daily at 6 PM. Take 7.5mg  on Wednesday and Sunday, take 5mg  all other days of the week.), Disp: 30 tablet, Rfl: 3 .  enoxaparin (LOVENOX) 80 MG/0.8ML injection, Inject 0.8 mLs (80 mg total) into the skin every 12 (twelve) hours for 10 doses., Disp: 8 mL, Rfl: 0   Objective:   Vitals:   01/06/19 0915  BP: 119/72  Pulse: 88  Temp: 98.2 F (36.8 C)  SpO2: 91%    Physical Exam Vitals signs and nursing note reviewed.  Constitutional:      Appearance: Normal appearance.  HENT:     Head: Normocephalic.  Neck:     Musculoskeletal: Normal range of motion.  Cardiovascular:     Rate and Rhythm: Normal rate.     Pulses: Normal pulses.  Pulmonary:     Effort: Pulmonary effort is normal.  Neurological:     General: No focal deficit present.     Mental Status: She is alert. Mental status is at baseline.  Psychiatric:        Mood and Affect: Mood normal.        Behavior: Behavior normal.     Assessment & Plan:  Open wound of left axillary region, subsequent encounter  Will order physical therapy for left upper extremity compression.  She is getting OT and PT at home.  I would like this to continue.  Vaseline to the skin area.  [place], DO

## 2019-01-06 NOTE — Telephone Encounter (Signed)
Was texted PST FS POC INR test results this AM. 1.4. This after a re-start of warfarin on 29-Dec-2018 after interruption for surgery. She was restarted on LMWH+warfarin bridge. She has remained on escalating doses of warfarin + LMWH bridge therapy. I have INCREASED dose of warfarin to 2x5mg  warfarin for 3 days with concomitant LMWH 1.5mg /kg SQ q24h bridge therapy until Monday at which time the INR will be repeated.

## 2019-01-07 ENCOUNTER — Emergency Department (HOSPITAL_BASED_OUTPATIENT_CLINIC_OR_DEPARTMENT_OTHER): Payer: Medicare Other

## 2019-01-07 ENCOUNTER — Other Ambulatory Visit: Payer: Self-pay

## 2019-01-07 ENCOUNTER — Emergency Department (HOSPITAL_COMMUNITY)
Admission: EM | Admit: 2019-01-07 | Discharge: 2019-01-08 | Disposition: A | Discharge status: Home / Self Care | Payer: Medicare Other | Attending: Emergency Medicine | Admitting: Emergency Medicine

## 2019-01-07 ENCOUNTER — Encounter (HOSPITAL_COMMUNITY): Payer: Self-pay | Admitting: Emergency Medicine

## 2019-01-07 ENCOUNTER — Emergency Department (HOSPITAL_COMMUNITY): Payer: Medicare Other

## 2019-01-07 DIAGNOSIS — Y69 Unspecified misadventure during surgical and medical care: Secondary | ICD-10-CM | POA: Diagnosis not present

## 2019-01-07 DIAGNOSIS — I110 Hypertensive heart disease with heart failure: Secondary | ICD-10-CM | POA: Insufficient documentation

## 2019-01-07 DIAGNOSIS — J45909 Unspecified asthma, uncomplicated: Secondary | ICD-10-CM | POA: Insufficient documentation

## 2019-01-07 DIAGNOSIS — M7989 Other specified soft tissue disorders: Secondary | ICD-10-CM

## 2019-01-07 DIAGNOSIS — J449 Chronic obstructive pulmonary disease, unspecified: Secondary | ICD-10-CM | POA: Diagnosis not present

## 2019-01-07 DIAGNOSIS — Z7901 Long term (current) use of anticoagulants: Secondary | ICD-10-CM | POA: Insufficient documentation

## 2019-01-07 DIAGNOSIS — Z853 Personal history of malignant neoplasm of breast: Secondary | ICD-10-CM | POA: Diagnosis not present

## 2019-01-07 DIAGNOSIS — F17210 Nicotine dependence, cigarettes, uncomplicated: Secondary | ICD-10-CM | POA: Insufficient documentation

## 2019-01-07 DIAGNOSIS — Z79899 Other long term (current) drug therapy: Secondary | ICD-10-CM | POA: Insufficient documentation

## 2019-01-07 DIAGNOSIS — I2510 Atherosclerotic heart disease of native coronary artery without angina pectoris: Secondary | ICD-10-CM | POA: Diagnosis not present

## 2019-01-07 DIAGNOSIS — R918 Other nonspecific abnormal finding of lung field: Secondary | ICD-10-CM | POA: Diagnosis not present

## 2019-01-07 DIAGNOSIS — R0789 Other chest pain: Secondary | ICD-10-CM | POA: Diagnosis not present

## 2019-01-07 DIAGNOSIS — T8149XA Infection following a procedure, other surgical site, initial encounter: Secondary | ICD-10-CM | POA: Diagnosis not present

## 2019-01-07 DIAGNOSIS — I5042 Chronic combined systolic (congestive) and diastolic (congestive) heart failure: Secondary | ICD-10-CM | POA: Insufficient documentation

## 2019-01-07 DIAGNOSIS — B9689 Other specified bacterial agents as the cause of diseases classified elsewhere: Secondary | ICD-10-CM | POA: Diagnosis not present

## 2019-01-07 LAB — COMPREHENSIVE METABOLIC PANEL
ALT: 26 U/L (ref 0–44)
AST: 36 U/L (ref 15–41)
Albumin: 3.2 g/dL — ABNORMAL LOW (ref 3.5–5.0)
Alkaline Phosphatase: 80 U/L (ref 38–126)
Anion gap: 9 (ref 5–15)
BUN: 8 mg/dL (ref 8–23)
CO2: 22 mmol/L (ref 22–32)
Calcium: 9.1 mg/dL (ref 8.9–10.3)
Chloride: 105 mmol/L (ref 98–111)
Creatinine, Ser: 0.62 mg/dL (ref 0.44–1.00)
GFR calc Af Amer: >60 mL/min (ref >60)
GFR calc non Af Amer: >60 mL/min (ref >60)
Glucose, Bld: 102 mg/dL — ABNORMAL HIGH (ref 70–99)
Potassium: 4.1 mmol/L (ref 3.5–5.1)
Sodium: 136 mmol/L (ref 135–145)
Total Bilirubin: 0.6 mg/dL (ref 0.3–1.2)
Total Protein: 7.8 g/dL (ref 6.5–8.1)

## 2019-01-07 LAB — CBC WITH DIFFERENTIAL/PLATELET
Abs Immature Granulocytes: 0.05 10*3/uL (ref 0.00–0.07)
Basophils Absolute: 0.1 10*3/uL (ref 0.0–0.1)
Basophils Relative: 1 %
Eosinophils Absolute: 0.2 10*3/uL (ref 0.0–0.5)
Eosinophils Relative: 2 %
HCT: 36.9 % (ref 36.0–46.0)
Hemoglobin: 11.6 g/dL — ABNORMAL LOW (ref 12.0–15.0)
Immature Granulocytes: 1 %
Lymphocytes Relative: 23 %
Lymphs Abs: 2.1 10*3/uL (ref 0.7–4.0)
MCH: 31.6 pg (ref 26.0–34.0)
MCHC: 31.4 g/dL (ref 30.0–36.0)
MCV: 100.5 fL — ABNORMAL HIGH (ref 80.0–100.0)
Monocytes Absolute: 1 10*3/uL (ref 0.1–1.0)
Monocytes Relative: 11 %
Neutro Abs: 5.6 10*3/uL (ref 1.7–7.7)
Neutrophils Relative %: 62 %
Platelets: 426 10*3/uL — ABNORMAL HIGH (ref 150–400)
RBC: 3.67 MIL/uL — ABNORMAL LOW (ref 3.87–5.11)
RDW: 11.7 % (ref 11.5–15.5)
WBC: 9.1 10*3/uL (ref 4.0–10.5)
nRBC: 0 % (ref 0.0–0.2)

## 2019-01-07 LAB — LACTIC ACID, PLASMA: Lactic Acid, Venous: 1.3 mmol/L (ref 0.5–1.9)

## 2019-01-07 LAB — PROTIME-INR
INR: 1.5 — ABNORMAL HIGH (ref 0.8–1.2)
Prothrombin Time: 18 seconds — ABNORMAL HIGH (ref 11.4–15.2)

## 2019-01-07 MED ORDER — SODIUM CHLORIDE 0.9 % IV SOLN
2.0000 g | Freq: Once | INTRAVENOUS | Status: AC
  Administered 2019-01-07: 15:00:00 2 g via INTRAVENOUS
  Filled 2019-01-07: qty 20

## 2019-01-07 MED ORDER — ONDANSETRON HCL 4 MG/2ML IJ SOLN
4.0000 mg | Freq: Once | INTRAMUSCULAR | Status: AC
  Administered 2019-01-07: 14:00:00 4 mg via INTRAVENOUS
  Filled 2019-01-07: qty 2

## 2019-01-07 MED ORDER — HYDROCODONE-ACETAMINOPHEN 5-325 MG PO TABS: 1.0000 | ORAL_TABLET | ORAL | 0 refills | Status: DC | PRN

## 2019-01-07 MED ORDER — FENTANYL CITRATE (PF) 100 MCG/2ML IJ SOLN
100.0000 ug | INTRAMUSCULAR | Status: DC | PRN
  Administered 2019-01-07 (×2): 100 ug via INTRAVENOUS
  Filled 2019-01-07 (×2): qty 2

## 2019-01-07 NOTE — Progress Notes (Signed)
VASCULAR LAB PRELIMINARY  PRELIMINARY  PRELIMINARY  PRELIMINARY  Left upper extremity venous duplex completed.    Preliminary report:  See Cv proc for preliminary results.  Gave report to Dr. Madalyn Rob, [facility], RVT 01/07/2019, 5:51 PM

## 2019-01-07 NOTE — ED Triage Notes (Signed)
Pt reports pain and bleeding from L axilla surgical site.   Pt febrile, redness and edema noted at site.

## 2019-01-07 NOTE — Discharge Instructions (Addendum)
Try using heat on the sore area 3 or 4 times a day for 30 minutes.  Continue taking the prescription antibiotic, given to you by your doctor.  Follow-up with her as scheduled for further management and treatment.

## 2019-01-07 NOTE — ED Notes (Signed)
Pt reports that pain medicine just given is helping  She thinks she is going to be discharged soon

## 2019-01-07 NOTE — ED Notes (Signed)
02 says lower ?? Pain med

## 2019-01-07 NOTE — ED Provider Notes (Signed)
[facility] EMERGENCY DEPARTMENT Provider Note   CSN: [id number] Arrival date & time: 01/07/19  1153    History   Chief Complaint Chief Complaint  Patient presents with  . Post-op Problem    HPI Kim Anthony is a 68 y.o. female.     HPI  She presents for pain and swelling in the left axilla and left upper chest wall, worsening over the last several days.  She recently had surgical revision left chest wall, for radiation changes post treatment of breast cancer.  She has also had left mastectomy.  She has not had a fever at home.  She was seen yesterday by her plastic surgeon in follow-up and had a dressing change.  At that time she was prescribed Keflex and fluconazole.  There was no physical intervention done at that time.  Today her daughter noticed some blood on the dressing.  Patient is currently being treated with Lovenox and warfarin, anticoagulation.  She denies shortness of breath, cough, weakness or dizziness.  There are no other known modifying factors.  Past Medical History:  Diagnosis Date  . Asthma   . Breast cancer ([place])    Left Breast Cancer  . CHF (congestive heart failure) ([place])   . COPD (chronic obstructive pulmonary disease) ([place])   . Coronary artery disease   . History of mitral valve replacement with mechanical valve   . HLD (hyperlipidemia)   . Hypertension   . Pulmonary embolism [facility])     Patient Active Problem List   Diagnosis Date Noted  . Acquired absence of left breast 12/20/2018  . Screen for colon cancer 06/07/2018  . Open wound of axillary region 05/11/2018  . COPD (chronic obstructive pulmonary disease) ([place]) 11/25/2017  . S/P MVR (mitral valve replacement)   . Chronic anticoagulation   . Hx of breast cancer 05/27/2017  . Soft tissue mass 02/25/2017  . Tobacco use disorder 02/25/2017  . Lymphadenopathy, mediastinal 02/25/2017  . History of mitral valve replacement with mechanical valve 11/18/2016  . Essential  hypertension 02/01/2016  . Allergic rhinitis 02/01/2016  . Healthcare maintenance 01/30/2016  . Chronic low back pain 07/22/2015  . Pulmonary embolism ([place]) 07/11/2015  . Chronic combined systolic and diastolic congestive heart failure ([place]) 07/11/2015  . Headache, common migraine 07/11/2015  . HLD (hyperlipidemia) 07/11/2015    Past Surgical History:  Procedure Laterality Date  . COLONOSCOPY WITH PROPOFOL N/A 06/07/2018   Procedure: COLONOSCOPY WITH PROPOFOL;  Surgeon: Wilford Corner, MD;  Location: [facility];  Service: Endoscopy;  Laterality: N/A;  . DEBRIDEMENT AND CLOSURE WOUND Left 12/28/2018   Procedure: Debridement And Closure Wound;  Surgeon: Coralie Keens, MD;  Location: [facility];  Service: General;  Laterality: Left;  [name] MASTECTOMY Left 2000s   for breast cancer, also s/p radiation and chemo  . MECHANICAL AORTIC AND MITRAL VALVE REPLACEMENT    . POLYPECTOMY  06/07/2018   Procedure: POLYPECTOMY;  Surgeon: Wilford Corner, MD;  Location: [facility];  Service: Endoscopy;;  . WOUND DEBRIDEMENT Left 12/28/2018   Procedure: EXCISION OF LEFT CHRONIC CHEST WALL WOUND;  Surgeon: Coralie Keens, MD;  Location: [facility];  Service: General;  Laterality: Left;     OB History    Gravida  2   Para  2   Term  2   Preterm      AB      Living        SAB      TAB      Ectopic  Multiple      Live Births  2            Home Medications    Prior to Admission medications   Medication Sig Start Date End Date Taking? Authorizing Provider  albuterol (PROVENTIL) (2.5 MG/3ML) 0.083% nebulizer solution Take 3 mLs (2.5 mg total) by nebulization every 6 (six) hours as needed for wheezing or shortness of breath. 11/26/17  Yes Katherine Roan, MD  albuterol (VENTOLIN HFA) 108 (90 Base) MCG/ACT inhaler Inhale 1-2 puffs into the lungs every 6 (six) hours as needed for wheezing or shortness of breath. 12/26/18  Yes Sid Falcon, MD  ANORO ELLIPTA 62.5-25 MCG/INH AEPB  Inhale 1 puff by mouth once daily Patient taking differently: Inhale 1 puff into the lungs daily.  08/25/18  Yes Isabelle Course, MD  Calcium-Phosphorus-Vitamin D (CITRACAL +D3 PO) Take 1 tablet by mouth 2 (two) times a day.   Yes [provider]  cephALEXin (KEFLEX) 500 MG capsule Take 1 capsule (500 mg total) by mouth 4 (four) times daily for 5 days. 01/06/19 01/11/19 Yes Dillingham, Loel Lofty, DO  diazepam (VALIUM) 5 MG tablet Take 1 tablet (5 mg total) by mouth every 12 (twelve) hours as needed for muscle spasms. 01/03/19  Yes Dillingham, Loel Lofty, DO  enoxaparin (LOVENOX) 120 MG/0.8ML injection Inject 120 mg into the skin daily.   Yes [provider]  fluticasone (FLONASE) 50 MCG/ACT nasal spray Place 2 sprays into both nostrils daily. Patient taking differently: Place 1 spray into both nostrils daily as needed for allergies.  12/03/16 04/11/26 Yes Jule Ser, DO  furosemide (LASIX) 40 MG tablet Take 1 tablet (40 mg total) by mouth daily. 11/30/18  Yes Isabelle Course, MD  gabapentin (NEURONTIN) 600 MG tablet Take 1 tablet (600 mg total) by mouth 2 (two) times daily. 09/29/18  Yes Isabelle Course, MD  lisinopril (ZESTRIL) 10 MG tablet Take 1 tablet (10 mg total) by mouth at bedtime. 10/31/18  Yes Isabelle Course, MD  metoprolol succinate (TOPROL-XL) 25 MG 24 hr tablet TAKE 1/2 (ONE-HALF) TABLET BY MOUTH ONCE DAILY Patient taking differently: Take 12.5 mg by mouth daily.  11/30/18  Yes Isabelle Course, MD  potassium chloride SA (K-DUR) 20 MEQ tablet Take 2 tablets (40 mEq total) by mouth daily. 12/26/18  Yes Sid Falcon, MD  simvastatin (ZOCOR) 20 MG tablet Take 1 tablet by mouth once daily Patient taking differently: Take 20 mg by mouth daily.  10/10/18  Yes Annia Belt, MD  warfarin (COUMADIN) 5 MG tablet Take 1 tablet (5 mg total) by mouth daily. Patient taking differently: Take 10 mg by mouth daily at 6 PM. Taking 10mg  until 01-09-19 per MD 07/14/18  Yes Forde Dandy,  PharmD  enoxaparin (LOVENOX) 80 MG/0.8ML injection Inject 0.8 mLs (80 mg total) into the skin every 12 (twelve) hours for 10 doses. 12/24/18 12/29/18  Pennie Banter, RPH-CPP  HYDROcodone-acetaminophen (NORCO) 5-325 MG tablet Take 1 tablet by mouth every 4 (four) hours as needed for moderate pain. 01/07/19   Daleen Bo, MD    Family History Family History  Problem Relation Age of Onset  . Heart attack Other   . Breast cancer Maternal Aunt 77    Social History Social History   Tobacco Use  . Smoking status: Current Every Day Smoker    Packs/day: 0.50    Years: 30.00    Pack years: 15.00    Types: Cigarettes  . Smokeless tobacco:  Never Used  . Tobacco comment: Sometimes less.  Substance Use Topics  . Alcohol use: Yes    Comment: beer  . Drug use: No     Allergies   Acetaminophen-codeine, Other, and Tramadol   Review of Systems Review of Systems  All other systems reviewed and are negative.    Physical Exam Updated Vital Signs BP (!) 121/58   Pulse 81   Temp 98.2 F (36.8 C) (Oral)   Resp 16   SpO2 91%   Physical Exam Vitals signs and nursing note reviewed.  Constitutional:      General: She is in acute distress (Uncomfortable).     Appearance: She is well-developed. She is not ill-appearing, toxic-appearing or diaphoretic.  HENT:     Head: Normocephalic and atraumatic.     Right Ear: External ear normal.     Left Ear: External ear normal.  Eyes:     Conjunctiva/sclera: Conjunctivae normal.     Pupils: Pupils are equal, round, and reactive to light.  Neck:     Musculoskeletal: Normal range of motion and neck supple.     Trachea: Phonation normal.  Cardiovascular:     Rate and Rhythm: Normal rate and regular rhythm.     Heart sounds: Normal heart sounds.  Pulmonary:     Effort: Pulmonary effort is normal.     Breath sounds: Normal breath sounds.  Abdominal:     Palpations: Abdomen is soft.     Tenderness: There is no abdominal tenderness.   Musculoskeletal:     Comments: Left upper chest wall, with multiple surgical scars.  Skin of the left chest wall is thickened, consistent with prior radiation treatment.  There is mild diffuse tenderness and erythema of the left upper chest wall.  There is swelling at the lateral aspect of the chest wall extending into the left axilla.  Left upper arm appears swollen, mostly medial towards the axilla.  No swelling at the elbow.  Neurovascular intact distally in left hand.  Skin:    General: Skin is warm and dry.     Coloration: Skin is not jaundiced or pale.     Comments: There is an open wound in the left axilla, with gauze, partially packing the wound, the gauze is notable for small amount of serosanguineous, red-colored drainage.  No associated fluctuance.  Neurological:     Mental Status: She is alert and oriented to person, place, and time.     Cranial Nerves: No cranial nerve deficit.     Sensory: No sensory deficit.     Motor: No abnormal muscle tone.     Coordination: Coordination normal.  Psychiatric:        Mood and Affect: Mood normal.        Behavior: Behavior normal.        Thought Content: Thought content normal.        Judgment: Judgment normal.      ED Treatments / Results  Labs (all labs ordered are listed, but only abnormal results are displayed) Labs Reviewed  COMPREHENSIVE METABOLIC PANEL - Abnormal; Notable for the following components:      Result Value   Glucose, Bld 102 (*)    Albumin 3.2 (*)    All other components within normal limits  CBC WITH DIFFERENTIAL/PLATELET - Abnormal; Notable for the following components:   RBC 3.67 (*)    Hemoglobin 11.6 (*)    MCV 100.5 (*)    Platelets 426 (*)    All other  components within normal limits  PROTIME-INR - Abnormal; Notable for the following components:   Prothrombin Time 18.0 (*)    INR 1.5 (*)    All other components within normal limits  CULTURE, BLOOD (ROUTINE X 2)  CULTURE, BLOOD (ROUTINE X 2)  LACTIC  ACID, PLASMA    EKG None  Radiology Dg Chest Port 1 View  Result Date: 01/07/2019 CLINICAL DATA:  Pain and bleeding from left axillary surgical site, history of left breast cancer and mastectomy. EXAM: PORTABLE CHEST 1 VIEW COMPARISON:  04/11/2018, CT chest, 10/07/2018 FINDINGS: Mild, diffuse bilateral interstitial pulmonary opacity, likely edema, and underlying emphysematous change. No focal airspace opacity. Small pulmonary nodules better characterized by prior CT. Cardiomegaly status post median sternotomy with mitral valve prosthesis. IMPRESSION: Mild, diffuse bilateral interstitial pulmonary opacity, likely edema, and underlying emphysematous change. No focal airspace opacity. Small pulmonary nodules better characterized by prior CT. Cardiomegaly status post median sternotomy with mitral valve prosthesis. Electronically Signed   By: Eddie Candle M.D.   On: 01/07/2019 12:49    Procedures .Critical Care Performed by: Daleen Bo, MD Authorized by: Daleen Bo, MD   Critical care provider statement:    Critical care time (minutes):  35   Critical care start time:  01/07/2019 12:15 PM   Critical care end time:  01/07/2019 4:26 PM   Critical care time was exclusive of:  Separately billable procedures and treating other patients   Critical care was necessary to treat or prevent imminent or life-threatening deterioration of the following conditions: Postoperative wound infection.   Critical care was time spent personally by me on the following activities:  Blood draw for specimens, development of treatment plan with patient or surrogate, discussions with consultants, evaluation of patient's response to treatment, examination of patient, obtaining history from patient or surrogate, ordering and performing treatments and interventions, ordering and review of laboratory studies, pulse oximetry, re-evaluation of patient's condition, review of old charts and ordering and review of radiographic  studies   (including critical care time)  Medications Ordered in ED Medications  fentaNYL (SUBLIMAZE) injection 100 mcg (100 mcg Intravenous Given 01/07/19 1527)  ondansetron (ZOFRAN) injection 4 mg (4 mg Intravenous Given 01/07/19 1357)  cefTRIAXone (ROCEPHIN) 2 g in sodium chloride 0.9 % 100 mL IVPB (0 g Intravenous Stopped 01/07/19 1500)     Initial Impression / Assessment and Plan / ED Course  I have reviewed the triage vital signs and the nursing notes.  Pertinent labs & imaging results that were available during my care of the patient were reviewed by me and considered in my medical decision making (see chart for details).  Clinical Course as of Jan 06 1625  Sat Jan 07, 2019  1339 Normal except minimal elevation glucose, slightly low albumin  Comprehensive metabolic panel(!) [EW]  9147 Normal except hemoglobin somewhat low and MCV high  CBC with Differential(!) [EW]  1339 Normal  Lactic acid, plasma [EW]  1339 Subtherapeutic  Protime-INR(!) [EW]  1340 Mild interstitial edema, without evidence for infiltrate, image reviewed by me  DG Chest Port 1 View [EW]  1340 Discussed findings with technician that did the test, no evidence for left upper extremity DVT.  No significant fluid collection in left axilla, by her report.  UE Venous Duplex ([facility] and [facility] ONLY) [EW]    Clinical Course User Index [EW] Daleen Bo, MD        Patient Vitals for the past 24 hrs:  BP Temp Temp src Pulse Resp SpO2  01/07/19  1600 (!) 121/58 - - 81 - 91 %  01/07/19 1545 127/74 - - 81 - 95 %  01/07/19 1515 118/70 - - 83 16 94 %  01/07/19 1500 121/71 - - - - -  01/07/19 1445 122/67 - - - - -  01/07/19 1430 112/65 - - - - -  01/07/19 1345 114/72 - - - - -  01/07/19 1330 121/70 - - 78 - 93 %  01/07/19 1315 112/72 - - - - -  01/07/19 1300 121/67 - - 85 - 96 %  01/07/19 1201 136/72 98.2 F (36.8 C) Oral 87 (!) 22 93 %  01/07/19 1200 136/72 - - 88 - 93 %    4:25 PM Reevaluation with update and  discussion. After initial assessment and treatment, an updated evaluation reveals she states that she feels better at this time.  Findings discussed with the patient and all questions were answered. Daleen Bo   Medical Decision Making: Wound infection with possible mild cellulitis however no evidence for deep tissue infection.  Patient improved with treatment and stable for discharge.  CRITICAL CARE-yes Performed by: Daleen Bo  Nursing Notes Reviewed/ Care Coordinated Applicable Imaging Reviewed Interpretation of Laboratory Data incorporated into ED treatment  The patient appears reasonably screened and/or stabilized for discharge and I doubt any other medical condition or other [facility] requiring further screening, evaluation, or treatment in the ED at this time prior to discharge.  Plan: Home Medications-continue routine medications; Home Treatments-apply heat to affected area; return here if the recommended treatment, does not improve the symptoms; Recommended follow up-PCP and plastic surgery, as needed.    Final Clinical Impressions(s) / ED Diagnoses   Final diagnoses:  Wound infection after surgery    ED Discharge Orders         Ordered    HYDROcodone-acetaminophen (NORCO) 5-325 MG tablet  Every 4 hours PRN     01/07/19 1624           Daleen Bo, MD 01/07/19 1626

## 2019-01-07 NOTE — ED Notes (Signed)
Pt ambulated to restroom. 

## 2019-01-07 NOTE — ED Notes (Signed)
Vascular tech in room.

## 2019-01-07 NOTE — ED Notes (Signed)
Pt ambulated to the restroom.

## 2019-01-09 ENCOUNTER — Encounter: Payer: Self-pay | Admitting: Plastic Surgery

## 2019-01-09 ENCOUNTER — Telehealth: Payer: Self-pay | Admitting: *Deleted

## 2019-01-09 ENCOUNTER — Ambulatory Visit (INDEPENDENT_AMBULATORY_CARE_PROVIDER_SITE_OTHER): Payer: Medicare Other | Admitting: Plastic Surgery

## 2019-01-09 ENCOUNTER — Other Ambulatory Visit: Payer: Self-pay

## 2019-01-09 ENCOUNTER — Telehealth: Payer: Self-pay | Admitting: Plastic Surgery

## 2019-01-09 ENCOUNTER — Telehealth: Payer: Self-pay | Admitting: Pharmacist

## 2019-01-09 VITALS — BP 147/85 | HR 74 | Temp 97.7°F | Ht 65.0 in | Wt 168.0 lb

## 2019-01-09 DIAGNOSIS — Z853 Personal history of malignant neoplasm of breast: Secondary | ICD-10-CM

## 2019-01-09 DIAGNOSIS — Z9012 Acquired absence of left breast and nipple: Secondary | ICD-10-CM

## 2019-01-09 DIAGNOSIS — S21102A Unspecified open wound of left front wall of thorax without penetration into thoracic cavity, initial encounter: Secondary | ICD-10-CM

## 2019-01-09 NOTE — Progress Notes (Signed)
   Subjective:    Patient ID: Kim Anthony, female    DOB: 11-23-1950, 68 y.o.   [name]: [id number]  The patient is a 68 yrs old bf here with her daughter for follow up after excision of an area on her left breast / chest.  The incision has opened and will likely open more.  There is significant radiation damage factoring into the poor healing. Her original breast cancer was in 2001 with complete axillary node dissection and post operative radiation.  She has a history of PE and is ion coumadin.  We will need to bridge her again.  The recent path was negative.  She was seen in the ED yesterday due to the opening of the wound.  She is on antibiotics.    Review of Systems  Constitutional: Positive for activity change. Negative for appetite change.  HENT: Negative.   Respiratory: Positive for chest tightness. Negative for shortness of breath.   Gastrointestinal: Negative for abdominal pain.  Genitourinary: Negative.   Musculoskeletal: Positive for arthralgias.  Skin: Positive for color change and wound.       Objective:   Physical Exam Vitals signs and nursing note reviewed.  Constitutional:      Appearance: Normal appearance.  HENT:     Head: Normocephalic and atraumatic.  Cardiovascular:     Rate and Rhythm: Normal rate.     Pulses: Normal pulses.  Pulmonary:     Effort: Pulmonary effort is normal. No respiratory distress.     Breath sounds: No wheezing.  Chest:    Abdominal:     General: Abdomen is flat. There is no distension.  Skin:    General: Skin is warm.  Neurological:     General: No focal deficit present.     Mental Status: She is alert. Mental status is at baseline.  Psychiatric:        Mood and Affect: Mood normal.        Behavior: Behavior normal.        Assessment & Plan:     ICD-10-CM   1. Hx of breast cancer  Z85.3   2. Acquired absence of left breast  Z90.12   3. Open chest wound, left, initial encounter  S21.102A     Recommend latissimus muscle flap  and patient agrees.  Will need 1 - 2 nights in the hospital.  Will plan for it as soon as possible.

## 2019-01-09 NOTE — Telephone Encounter (Signed)
Received fax from [facility] regarding the prescription for Hydrocodone-Acet 5-325mg  tablet.  The pharmacy is requesting to resend diagnosis acute/chronic treatment?  Called and spoke with Gerald Stabs with the pharmacy and gave her the verbal diagnosis of Open wound of left axillary region, (S41.102D).  She verbalized understanding and agreed.//AB/CMA

## 2019-01-09 NOTE — Telephone Encounter (Signed)
Patient's daughter had sent text to me earlier reporting this value. Patient's daughter was advised (and sent photo image securely) of the following dose:  Take two tablets of your 5mg  peach-colored warfarin tablets today; 1 and 1/2  tablets on Tuesday; 2 tablets on Wednesday; 1 and 1/2 tablets on Thursday--and repeat the patient self-testing, fingerstick point of care INR determination on Friday, 13-Jan-2019.

## 2019-01-09 NOTE — H&P (View-Only) (Signed)
   Subjective:    Patient ID: Kim Anthony, female    DOB: 1950-07-26, 68 y.o.   [name]: [id number]  The patient is a 68 yrs old bf here with her daughter for follow up after excision of an area on her left breast / chest.  The incision has opened and will likely open more.  There is significant radiation damage factoring into the poor healing. Her original breast cancer was in 2001 with complete axillary node dissection and post operative radiation.  She has a history of PE and is ion coumadin.  We will need to bridge her again.  The recent path was negative.  She was seen in the ED yesterday due to the opening of the wound.  She is on antibiotics.    Review of Systems  Constitutional: Positive for activity change. Negative for appetite change.  HENT: Negative.   Respiratory: Positive for chest tightness. Negative for shortness of breath.   Gastrointestinal: Negative for abdominal pain.  Genitourinary: Negative.   Musculoskeletal: Positive for arthralgias.  Skin: Positive for color change and wound.       Objective:   Physical Exam Vitals signs and nursing note reviewed.  Constitutional:      Appearance: Normal appearance.  HENT:     Head: Normocephalic and atraumatic.  Cardiovascular:     Rate and Rhythm: Normal rate.     Pulses: Normal pulses.  Pulmonary:     Effort: Pulmonary effort is normal. No respiratory distress.     Breath sounds: No wheezing.  Chest:    Abdominal:     General: Abdomen is flat. There is no distension.  Skin:    General: Skin is warm.  Neurological:     General: No focal deficit present.     Mental Status: She is alert. Mental status is at baseline.  Psychiatric:        Mood and Affect: Mood normal.        Behavior: Behavior normal.        Assessment & Plan:     ICD-10-CM   1. Hx of breast cancer  Z85.3   2. Acquired absence of left breast  Z90.12   3. Open chest wound, left, initial encounter  S21.102A     Recommend latissimus muscle flap  and patient agrees.  Will need 1 - 2 nights in the hospital.  Will plan for it as soon as possible.

## 2019-01-09 NOTE — Telephone Encounter (Signed)
Patient's daughter had sent text to me earlier reporting this value. Patient's daughter was advised (and sent photo image securely) of the following dose:  Take two tablets of your 5mg  peach-colored warfarin tablets today; 1 and 1/2 tablets on Tuesday; 2 tablets on Wednesday; 1 and 1/2 tablets on Thursday; Repeat patient self-testing, fingerstick point of care INR determination on 13-Jan-2019. DISCONTINUE your enoxaparin/Lovenox injections you had been self-administering, 120mg  SQ q24h (1.5mg /kg/day).

## 2019-01-09 NOTE — Telephone Encounter (Signed)
Received a voicemail from [place], patient's daughter. Monica explained that the patient's wound opened up over the weekend and that the patient was taken to the emergency department. Spoke with Dr. Marla Roe and called patient's daughter back to get further clarification on patient's status. [name] stated that patient is currently still resting, but feels that she is "a little bit better". Patient received IV antibiotics and pain medication at the emergency department. Patient's daughter states that the wound is still open but with a little less drainage. Patient's daughter states that a compression sleeve finally came and they have it on at this time. Patient is still taking antibiotic that was prescribed at Friday's visit in office. Patient's daughter stated she would call back with any changes. Update given to [place], [place] and Dr. Marla Roe.

## 2019-01-09 NOTE — Telephone Encounter (Signed)
Received fax from [facility] self-testing service    INR 2.7 done today 7.20.

## 2019-01-10 DIAGNOSIS — Z4801 Encounter for change or removal of surgical wound dressing: Secondary | ICD-10-CM | POA: Diagnosis not present

## 2019-01-10 DIAGNOSIS — Z4789 Encounter for other orthopedic aftercare: Secondary | ICD-10-CM | POA: Diagnosis not present

## 2019-01-11 DIAGNOSIS — Z4789 Encounter for other orthopedic aftercare: Secondary | ICD-10-CM | POA: Diagnosis not present

## 2019-01-11 DIAGNOSIS — Z4801 Encounter for change or removal of surgical wound dressing: Secondary | ICD-10-CM | POA: Diagnosis not present

## 2019-01-12 ENCOUNTER — Other Ambulatory Visit: Payer: Self-pay

## 2019-01-12 ENCOUNTER — Telehealth: Payer: Self-pay | Admitting: Pharmacist

## 2019-01-12 ENCOUNTER — Emergency Department (HOSPITAL_COMMUNITY)
Admission: EM | Admit: 2019-01-12 | Discharge: 2019-01-12 | Disposition: A | Discharge status: Home / Self Care | Payer: Medicare Other | Attending: Emergency Medicine | Admitting: Emergency Medicine

## 2019-01-12 ENCOUNTER — Encounter (HOSPITAL_COMMUNITY): Payer: Self-pay | Admitting: Emergency Medicine

## 2019-01-12 DIAGNOSIS — Z4801 Encounter for change or removal of surgical wound dressing: Secondary | ICD-10-CM | POA: Diagnosis not present

## 2019-01-12 DIAGNOSIS — I509 Heart failure, unspecified: Secondary | ICD-10-CM | POA: Insufficient documentation

## 2019-01-12 DIAGNOSIS — F17210 Nicotine dependence, cigarettes, uncomplicated: Secondary | ICD-10-CM | POA: Diagnosis not present

## 2019-01-12 DIAGNOSIS — Z4789 Encounter for other orthopedic aftercare: Secondary | ICD-10-CM | POA: Diagnosis not present

## 2019-01-12 DIAGNOSIS — I110 Hypertensive heart disease with heart failure: Secondary | ICD-10-CM | POA: Diagnosis not present

## 2019-01-12 DIAGNOSIS — Z79899 Other long term (current) drug therapy: Secondary | ICD-10-CM | POA: Insufficient documentation

## 2019-01-12 DIAGNOSIS — G8918 Other acute postprocedural pain: Secondary | ICD-10-CM | POA: Insufficient documentation

## 2019-01-12 DIAGNOSIS — J449 Chronic obstructive pulmonary disease, unspecified: Secondary | ICD-10-CM | POA: Diagnosis not present

## 2019-01-12 DIAGNOSIS — Z86711 Personal history of pulmonary embolism: Secondary | ICD-10-CM | POA: Insufficient documentation

## 2019-01-12 DIAGNOSIS — Z853 Personal history of malignant neoplasm of breast: Secondary | ICD-10-CM | POA: Diagnosis not present

## 2019-01-12 LAB — COMPREHENSIVE METABOLIC PANEL
ALT: 22 U/L (ref 0–44)
AST: 33 U/L (ref 15–41)
Albumin: 3.3 g/dL — ABNORMAL LOW (ref 3.5–5.0)
Alkaline Phosphatase: 84 U/L (ref 38–126)
Anion gap: 11 (ref 5–15)
BUN: 13 mg/dL (ref 8–23)
CO2: 22 mmol/L (ref 22–32)
Calcium: 9.3 mg/dL (ref 8.9–10.3)
Chloride: 106 mmol/L (ref 98–111)
Creatinine, Ser: 0.57 mg/dL (ref 0.44–1.00)
GFR calc Af Amer: >60 mL/min (ref >60)
GFR calc non Af Amer: >60 mL/min (ref >60)
Glucose, Bld: 91 mg/dL (ref 70–99)
Potassium: 3.4 mmol/L — ABNORMAL LOW (ref 3.5–5.1)
Sodium: 139 mmol/L (ref 135–145)
Total Bilirubin: 0.3 mg/dL (ref 0.3–1.2)
Total Protein: 7.6 g/dL (ref 6.5–8.1)

## 2019-01-12 LAB — PROTIME-INR
INR: 3 — ABNORMAL HIGH (ref 0.8–1.2)
Prothrombin Time: 30.6 seconds — ABNORMAL HIGH (ref 11.4–15.2)

## 2019-01-12 LAB — CULTURE, BLOOD (ROUTINE X 2)
Culture: NO GROWTH
Culture: NO GROWTH
Special Requests: ADEQUATE

## 2019-01-12 LAB — CBC WITH DIFFERENTIAL/PLATELET
Abs Immature Granulocytes: 0.07 10*3/uL (ref 0.00–0.07)
Basophils Absolute: 0.1 10*3/uL (ref 0.0–0.1)
Basophils Relative: 1 %
Eosinophils Absolute: 0.3 10*3/uL (ref 0.0–0.5)
Eosinophils Relative: 4 %
HCT: 40.3 % (ref 36.0–46.0)
Hemoglobin: 12.3 g/dL (ref 12.0–15.0)
Immature Granulocytes: 1 %
Lymphocytes Relative: 27 %
Lymphs Abs: 2.3 10*3/uL (ref 0.7–4.0)
MCH: 31.1 pg (ref 26.0–34.0)
MCHC: 30.5 g/dL (ref 30.0–36.0)
MCV: 101.8 fL — ABNORMAL HIGH (ref 80.0–100.0)
Monocytes Absolute: 0.5 10*3/uL (ref 0.1–1.0)
Monocytes Relative: 6 %
Neutro Abs: 5.4 10*3/uL (ref 1.7–7.7)
Neutrophils Relative %: 61 %
Platelets: 614 10*3/uL — ABNORMAL HIGH (ref 150–400)
RBC: 3.96 MIL/uL (ref 3.87–5.11)
RDW: 11.9 % (ref 11.5–15.5)
WBC: 8.7 10*3/uL (ref 4.0–10.5)
nRBC: 0 % (ref 0.0–0.2)

## 2019-01-12 MED ORDER — FENTANYL CITRATE (PF) 100 MCG/2ML IJ SOLN
50.0000 ug | Freq: Once | INTRAMUSCULAR | Status: AC
  Administered 2019-01-12: 50 ug via INTRAVENOUS
  Filled 2019-01-12: qty 2

## 2019-01-12 NOTE — ED Provider Notes (Addendum)
[facility] EMERGENCY DEPARTMENT Provider Note   CSN: [id number] Arrival date & time: 01/12/19  4098    History   Chief Complaint Chief Complaint  Patient presents with  . Post-op Problem  . Arm Pain  . Leg Pain    HPI Kim Anthony is a 68 y.o. female with a PMH of left breast cancer s/p masectomy and complete axillary dissection 18 years ago, CHF, COPD, PE on warfarin, HTN, Asthma, and CAD presenting with due to a post operate complication. Patient reports she had surgery on 07/08 by Dr. Ninfa Linden and Dr. Marla Roe after the incision from her masectomy was infected. Patient reports pain and amount of bloody drainage has increased. Patient states she had an appointment with Dr. Marla Roe 4 days ago and is being scheduled for an additional surgery. Patient reports bilateral atraumatic thigh pain. Patient states she has been taking hydrocodone with partial relief. Patient denies fever, cough, congestion, chest pain, shortness of breath, nausea, vomiting, abdominal pain, sick exposures, or recent travel.      HPI  Past Medical History:  Diagnosis Date  . Asthma   . Breast cancer ([place])    Left Breast Cancer  . CHF (congestive heart failure) ([place])   . COPD (chronic obstructive pulmonary disease) ([place])   . Coronary artery disease   . History of mitral valve replacement with mechanical valve   . HLD (hyperlipidemia)   . Hypertension   . Pulmonary embolism [facility])     Patient Active Problem List   Diagnosis Date Noted  . Open chest wound, left, initial encounter 01/09/2019  . Acquired absence of left breast 12/20/2018  . Screen for colon cancer 06/07/2018  . Open wound of axillary region 05/11/2018  . COPD (chronic obstructive pulmonary disease) ([place]) 11/25/2017  . S/P MVR (mitral valve replacement)   . Chronic anticoagulation   . Hx of breast cancer 05/27/2017  . Soft tissue mass 02/25/2017  . Tobacco use disorder 02/25/2017  . Lymphadenopathy, mediastinal  02/25/2017  . History of mitral valve replacement with mechanical valve 11/18/2016  . Essential hypertension 02/01/2016  . Allergic rhinitis 02/01/2016  . Healthcare maintenance 01/30/2016  . Chronic low back pain 07/22/2015  . Pulmonary embolism ([place]) 07/11/2015  . Chronic combined systolic and diastolic congestive heart failure ([place]) 07/11/2015  . Headache, common migraine 07/11/2015  . HLD (hyperlipidemia) 07/11/2015    Past Surgical History:  Procedure Laterality Date  . COLONOSCOPY WITH PROPOFOL N/A 06/07/2018   Procedure: COLONOSCOPY WITH PROPOFOL;  Surgeon: Wilford Corner, MD;  [facility];  Service: Endoscopy;  Laterality: N/A;  . DEBRIDEMENT AND CLOSURE WOUND Left 12/28/2018   Procedure: Debridement And Closure Wound;  Surgeon: Coralie Keens, MD;  Location: [facility];  Service: General;  Laterality: Left;  [name] MASTECTOMY Left 2000s   for breast cancer, also s/p radiation and chemo  . MECHANICAL AORTIC AND MITRAL VALVE REPLACEMENT    . POLYPECTOMY  06/07/2018   Procedure: POLYPECTOMY;  Surgeon: Wilford Corner, MD;  [facility];  Service: Endoscopy;;  . WOUND DEBRIDEMENT Left 12/28/2018   Procedure: EXCISION OF LEFT CHRONIC CHEST WALL WOUND;  Surgeon: Coralie Keens, MD;  Location: [facility];  Service: General;  Laterality: Left;     OB History    Gravida  2   Para  2   Term  2   Preterm      AB      Living        SAB  TAB      Ectopic      Multiple      Live Births  2            Home Medications    Prior to Admission medications   Medication Sig Start Date End Date Taking? Authorizing Provider  albuterol (PROVENTIL) (2.5 MG/3ML) 0.083% nebulizer solution Take 3 mLs (2.5 mg total) by nebulization every 6 (six) hours as needed for wheezing or shortness of breath. 11/26/17  Yes Katherine Roan, MD  albuterol (VENTOLIN HFA) 108 (90 Base) MCG/ACT inhaler Inhale 1-2 puffs into the lungs every 6 (six) hours as needed for  wheezing or shortness of breath. 12/26/18  Yes Sid Falcon, MD  ANORO ELLIPTA 62.5-25 MCG/INH AEPB Inhale 1 puff by mouth once daily Patient taking differently: Inhale 1 puff into the lungs daily.  08/25/18  Yes Isabelle Course, MD  Calcium-Phosphorus-Vitamin D (CITRACAL +D3 PO) Take 1 tablet by mouth 2 (two) times a day.   Yes [provider]  diazepam (VALIUM) 5 MG tablet Take 1 tablet (5 mg total) by mouth every 12 (twelve) hours as needed for muscle spasms. 01/03/19  Yes Dillingham, Loel Lofty, DO  fluticasone (FLONASE) 50 MCG/ACT nasal spray Place 2 sprays into both nostrils daily. Patient taking differently: Place 1 spray into both nostrils daily as needed for allergies.  12/03/16 04/11/26 Yes Jule Ser, DO  furosemide (LASIX) 40 MG tablet Take 1 tablet (40 mg total) by mouth daily. 11/30/18  Yes Isabelle Course, MD  gabapentin (NEURONTIN) 600 MG tablet Take 1 tablet (600 mg total) by mouth 2 (two) times daily. 09/29/18  Yes Isabelle Course, MD  HYDROcodone-acetaminophen (NORCO) 5-325 MG tablet Take 1 tablet by mouth every 4 (four) hours as needed for moderate pain. 01/07/19  Yes Daleen Bo, MD  lisinopril (ZESTRIL) 10 MG tablet Take 1 tablet (10 mg total) by mouth at bedtime. 10/31/18  Yes Isabelle Course, MD  metoprolol succinate (TOPROL-XL) 25 MG 24 hr tablet TAKE 1/2 (ONE-HALF) TABLET BY MOUTH ONCE DAILY Patient taking differently: Take 12.5 mg by mouth at bedtime.  11/30/18  Yes Isabelle Course, MD  potassium chloride SA (K-DUR) 20 MEQ tablet Take 2 tablets (40 mEq total) by mouth daily. 12/26/18  Yes Sid Falcon, MD  simvastatin (ZOCOR) 20 MG tablet Take 1 tablet by mouth once daily Patient taking differently: Take 20 mg by mouth daily.  10/10/18  Yes Annia Belt, MD  warfarin (COUMADIN) 5 MG tablet Take 1 tablet (5 mg total) by mouth daily. Patient taking differently: Take 5-7.5 mg by mouth See admin instructions. Take 7.5 mg on Sunday and Wednesday Tale 5 mg all the  other days 07/14/18  Yes Forde Dandy, PharmD  enoxaparin (LOVENOX) 120 MG/0.8ML injection Inject 120 mg into the skin daily.    [provider]  enoxaparin (LOVENOX) 80 MG/0.8ML injection Inject 0.8 mLs (80 mg total) into the skin every 12 (twelve) hours for 10 doses. Patient not taking: Reported on 01/12/2019 12/24/18 12/29/18  Pennie Banter, RPH-CPP    Family History Family History  Problem Relation Age of Onset  . Heart attack Other   . Breast cancer Maternal Aunt 42    Social History Social History   Tobacco Use  . Smoking status: Current Every Day Smoker    Packs/day: 0.50    Years: 30.00    Pack years: 15.00    Types: Cigarettes  . Smokeless tobacco: Never Used  .  Tobacco comment: Sometimes less.  Substance Use Topics  . Alcohol use: Yes    Comment: beer  . Drug use: No     Allergies   Acetaminophen-codeine, Other, and Tramadol   Review of Systems Review of Systems  Constitutional: Negative for chills, diaphoresis and fever.  Eyes: Negative for visual disturbance.  Respiratory: Negative for cough and shortness of breath.   Cardiovascular: Negative for chest pain.  Gastrointestinal: Negative for abdominal pain, nausea and vomiting.  Endocrine: Negative for cold intolerance and heat intolerance.  Genitourinary: Negative for dysuria.  Musculoskeletal: Positive for myalgias. Negative for back pain.  Skin: Positive for color change and wound. Negative for rash.  Allergic/Immunologic: Negative for immunocompromised state.  Neurological: Negative for weakness and numbness.  Hematological: Bruises/bleeds easily.    Physical Exam Updated Vital Signs BP (!) 159/94 (BP Location: Right Arm)   Pulse 80   Temp 98.6 F (37 C) (Oral)   Resp (!) 24   Ht 5\' 5"  (1.651 m)   Wt 77.6 kg   SpO2 98%   BMI 28.46 kg/m   Physical Exam Vitals signs and nursing note reviewed.  Constitutional:      General: She is not in acute distress.    Appearance: She is  well-developed. She is not diaphoretic.  HENT:     Head: Normocephalic and atraumatic.  Neck:     Musculoskeletal: Normal range of motion.  Cardiovascular:     Rate and Rhythm: Normal rate and regular rhythm.     Heart sounds: Normal heart sounds. No murmur. No friction rub. No gallop.   Pulmonary:     Effort: Pulmonary effort is normal. No respiratory distress.     Breath sounds: Normal breath sounds. No wheezing or rales.  Abdominal:     Palpations: Abdomen is soft.     Tenderness: There is no abdominal tenderness.  Musculoskeletal: Normal range of motion.     Right hip: Normal. She exhibits normal range of motion, normal strength and no tenderness.     Left hip: Normal. She exhibits normal range of motion, normal strength and no tenderness.     Right knee: Normal. She exhibits normal range of motion and no swelling.     Left knee: Normal. She exhibits normal range of motion, no swelling and no effusion.     Right upper leg: Normal. She exhibits no tenderness, no bony tenderness and no swelling.     Left upper leg: Normal. She exhibits no tenderness, no bony tenderness and no swelling.  Skin:    General: Skin is warm.     Findings: Wound present. No erythema or rash.       Neurological:     Mental Status: She is alert.        ED Treatments / Results  Labs (all labs ordered are listed, but only abnormal results are displayed) Labs Reviewed  COMPREHENSIVE METABOLIC PANEL - Abnormal; Notable for the following components:      Result Value   Potassium 3.4 (*)    Albumin 3.3 (*)    All other components within normal limits  CBC WITH DIFFERENTIAL/PLATELET - Abnormal; Notable for the following components:   MCV 101.8 (*)    Platelets 614 (*)    All other components within normal limits  PROTIME-INR - Abnormal; Notable for the following components:   Prothrombin Time 30.6 (*)    INR 3.0 (*)    All other components within normal limits    EKG None  Radiology  No  results found.  Procedures Procedures (including critical care time)  Medications Ordered in ED Medications  fentaNYL (SUBLIMAZE) injection 50 mcg (50 mcg Intravenous Given 01/12/19 1031)     Initial Impression / Assessment and Plan / ED Course  I have reviewed the triage vital signs and the nursing notes.  Pertinent labs & imaging results that were available during my care of the patient were reviewed by me and considered in my medical decision making (see chart for details).  Clinical Course as of Jan 11 1230  Thu Jan 12, 2019  1040 WBCs are within normal limits.   WBC: 8.7 [AH]  1040 INR within therapeutic range at 3.0.  INR(!): 3.0 [AH]    Clinical Course User Index [AH] Arville Lime, PA-C      Patient presents due to post operative complications. Patient reports increasing pain and drainage from surgical site over left axilla. Provided pain medicine in the ER. Patient is afebrile and WBCs are within normal limits. Consulted Dr. Marla Roe, plastic surgery. Dr. Marla Roe recommends patient continues dressing changes, antibiotics, and pain medicine as previously prescribed. Advised patient to follow up with Dr. Marla Roe for surgery as previously discussed. Patient is stable in no acute distress. Discussed return precautions. Patient states she understands and agrees with plan.  Findings and plan of care discussed with supervising physician Dr. Kathrynn Humble who personally evaluated and examined this patient.   Final Clinical Impressions(s) / ED Diagnoses   Final diagnoses:  Post-operative pain    ED Discharge Orders    None       Arville Lime, PA-C 01/12/19 1247    Darlin Drop [place], [place] 01/12/19 [place], Ankit, MD 01/15/19 1434

## 2019-01-12 NOTE — Telephone Encounter (Signed)
Plan of action for Bridging Therapy for [employer] 1. STOP warfarin/Coumadin on Thursday July 23rd, 2020. 2. START enoxaparin/Lovenox 120mg  (1.5mg /kg) every 24 hours on Friday July 24th, 2020. YOU HAVE THESE SYRINGES AT HOME.  Administer at level of waistband, 2 inches away from the bellybutton. Inject at [place]: a. Friday, July 24th. b. Saturday, July 25th. c. Sunday, July 26th. d. Monday, July 27th. e. Tuesday, July 28th LAST DOSE at 8:00AM. Perform patient-self-testing, finger-stick INR with your home INR device and text results to me at [phone]. I will record this value in [place] (electronic medical record) for your surgeon to see that the INR has come down to an appropriate level to have the surgery the next day).  3. SURGERY is scheduled on Wednesday, July 29th, 2020. 4. AFTER SURGERY, anticoagulation management will be provided by the pharmacists in the hospital. They may elect to use the 80mg  every 12 hours. In the outpatient setting, we will revert to the ONCE-DAILY (based upon what the insurance pays for in the outpatient setting).  5. When the patient is discharged, call me/text me at [phone] and I will give further instructions.

## 2019-01-12 NOTE — ED Triage Notes (Signed)
Patient recently has surgery left axilla and pain continues at surgical site and now bilateral thigh pain.

## 2019-01-12 NOTE — Discharge Instructions (Addendum)
You have been seen today for post operative pain and complications. Please read and follow all provided instructions.   1. Medications: continue antibiotics and pain medicine as previously prescribed, usual home medications 2. Treatment: rest, drink plenty of fluids 3. Follow Up: Please call and follow up with Dr. Marla Roe for further treatment of wound. Please follow up with your primary doctor in 2 days for discussion of your diagnoses and further evaluation after today's visit; if you do not have a primary care doctor use the resource guide provided to find one; Please return to the ER for any new or worsening symptoms. Please obtain all of your results from medical records or have your doctors office obtain the results - share them with your doctor - you should be seen at your doctors office. Call today to arrange your follow up.   Take medications as prescribed. Please review all of the medicines and only take them if you do not have an allergy to them. Return to the emergency room for worsening condition or new concerning symptoms. Follow up with your regular doctor. If you don't have a regular doctor use one of the numbers below to establish a primary care doctor.  Please be aware that if you are taking birth control pills, taking other prescriptions, ESPECIALLY ANTIBIOTICS may make the birth control ineffective - if this is the case, either do not engage in sexual activity or use alternative methods of birth control such as condoms until you have finished the medicine and your family doctor says it is OK to restart them. If you are on a blood thinner such as COUMADIN, be aware that any other medicine that you take may cause the coumadin to either work too much, or not enough - you should have your coumadin level rechecked in next 7 days if this is the case.  ?  It is also a possibility that you have an allergic reaction to any of the medicines that you have been prescribed - Everybody reacts  differently to medications and while MOST people have no trouble with most medicines, you may have a reaction such as nausea, vomiting, rash, swelling, shortness of breath. If this is the case, please stop taking the medicine immediately and contact your physician.  ?  You should return to the ER if you develop severe or worsening symptoms.   Emergency Department Resource Guide 1) Find a Doctor and Pay Out of Pocket Although you won't have to find out who is covered by your insurance plan, it is a good idea to ask around and get recommendations. You will then need to call the office and see if the doctor you have chosen will accept you as a new patient and what types of options they offer for patients who are self-pay. Some doctors offer discounts or will set up payment plans for their patients who do not have insurance, but you will need to ask so you aren't surprised when you get to your appointment.  2) Contact Your Local Health Department Not all health departments have doctors that can see patients for sick visits, but many do, so it is worth a call to see if yours does. If you don't know where your local health department is, you can check in your phone book. The CDC also has a tool to help you locate your state's health department, and many state websites also have listings of all of their local health departments.  3) Find a [facility] If your illness  is not likely to be very severe or complicated, you may want to try a walk in clinic. These are popping up all over the country in pharmacies, drugstores, and shopping centers. They're usually staffed by nurse practitioners or physician assistants that have been trained to treat common illnesses and complaints. They're usually fairly quick and inexpensive. However, if you have serious medical issues or chronic medical problems, these are probably not your best option.  No Primary Care Doctor: Call Health Connect at  [phone] - they can help  you locate a primary care doctor that  accepts your insurance, provides certain services, etc. Physician Referral Service- [phone]  Chronic Pain Problems: Organization         Address  Phone   Notes  [facility]  [phone] Patients need to be referred by their primary care doctor.   Medication Assistance: Organization         Address  Phone   Notes  [facility] Medication [facility] [address] [place], [place] [address] [phone] --Must be a resident of [facility] -- Must have NO insurance coverage whatsoever (no Medicaid/ Medicare, etc.) -- The pt. MUST have a primary care doctor that directs their care regularly and follows them in the community   [facility]  [phone]   [employer]  [phone]    Agencies that provide inexpensive medical care: Organization         Address  Phone   Notes  [place]  [phone]   [facility]    [phone]   [facility] [place], [place] [address] [phone]   [facility] [address], [place] [phone]   [facility]    [phone]   [facility]    [phone]   [place] [address], [place] Phone:  [phone], Fax:  [phone] Hours of Operation:  9 am - 6 pm, M-F.  Also accepts Medicaid/Medicare and self-pay.  [facility] for [place] [address], [address], [place] Phone: [phone], Fax: [phone]. Hours of Operation:  8:30 am - 5:30 pm, M-F.  Also accepts Medicaid and self-pay.  [facility], [facility], [facility] [address], [place] Phone: [phone]   [place], [place], [place] [phone], Ext. [phone] Mondays & Thursdays: 7-9 AM.  First 15 patients are seen on a first come, first serve basis.    [place]  Providers:  Organization         Address  Phone   Notes  [facility] [address], [place] [phone] Also accepts self-pay patients.  [facility] [address], [place]  [phone]   [place], [address], [place] [phone]   [facility] [address], [place] [phone]   Lucianne Lei [address], [place]   [phone] Only accepts [place] patients after they have their name applied to their card.   Self-Pay (no insurance) in [facility]:  Organization         Address  Phone   Notes  Sickle Cell Patients, [facility] [place] [phone]   [facility]  [facility] [place] [phone]   [facility] [place]  [place], [place], [place] [phone]   [facility]/Dr. Osei-Bonsu  [address] [place] or [address], [place] [phone] Phone number for both [place] and [place] locations is the same.  [facility] [address], [place] [phone]   [facility] [address] [place] or [address] [phone]   [facility] [address], [place] [phone], phone; [phone], fax Sees patients 1st and 3rd Saturday of every month.  Must not qualify for public or private insurance (i.e. Medicaid, Medicare, [place] Health Choice, Veterans' Benefits)  Household income should be no more than 200% of the poverty level The clinic cannot treat you if you are pregnant or think you are pregnant  Sexually transmitted diseases are not treated at the clinic.

## 2019-01-13 ENCOUNTER — Telehealth: Payer: Self-pay | Admitting: Pharmacist

## 2019-01-13 ENCOUNTER — Other Ambulatory Visit: Payer: Self-pay | Admitting: Surgery

## 2019-01-13 ENCOUNTER — Telehealth: Payer: Self-pay | Admitting: Plastic Surgery

## 2019-01-13 DIAGNOSIS — Z4801 Encounter for change or removal of surgical wound dressing: Secondary | ICD-10-CM | POA: Diagnosis not present

## 2019-01-13 DIAGNOSIS — Z4789 Encounter for other orthopedic aftercare: Secondary | ICD-10-CM | POA: Diagnosis not present

## 2019-01-13 NOTE — Telephone Encounter (Signed)
Discussed with Dr. Audelia Hives at 3:01pm 13-Jan-2019:  Because of concerns of increased bleeding risk due to nature of the surgery/procedure, we both agree that the LAST dose of enoxaparin on 8:00am Tuesday 17-Jan-2019 will be ONLY 60mg  (1/2 of the 120mg  pre-filled syringe). I have called the patient's daughter, Kim Anthony and discussed with her. She has amended her written instructions and she was texted securely the following text:   "The LAST DOSE on Tuesday 17-Jan-2019 at 8:00am is now 1/2 of the 120mg  pre-filled syringe (60mg  = 1/2 of the volume of the PFS).

## 2019-01-13 NOTE — Telephone Encounter (Signed)
Discussed anticoagulation with Dr. Elie Confer.

## 2019-01-13 NOTE — Telephone Encounter (Signed)
Received call from [place], patient's daughter, who was attempting to pick up compression sleeve for the patient. Pharmacy is requesting new prescription for the left arm compression sleeve for swelling as well as grade of compression. It will need patient's name. Pharmacy also requesting letter of medical necessity, but stated that the office note for patient should suffice. Please fax to [phone]

## 2019-01-16 ENCOUNTER — Other Ambulatory Visit (HOSPITAL_COMMUNITY)
Admission: RE | Admit: 2019-01-16 | Discharge: 2019-01-16 | Disposition: A | Discharge status: Home / Self Care | Payer: Medicare Other | Source: Ambulatory Visit | Attending: Plastic Surgery | Admitting: Plastic Surgery

## 2019-01-16 DIAGNOSIS — Z20828 Contact with and (suspected) exposure to other viral communicable diseases: Secondary | ICD-10-CM | POA: Insufficient documentation

## 2019-01-16 DIAGNOSIS — Z4789 Encounter for other orthopedic aftercare: Secondary | ICD-10-CM | POA: Diagnosis not present

## 2019-01-16 DIAGNOSIS — Z4801 Encounter for change or removal of surgical wound dressing: Secondary | ICD-10-CM | POA: Diagnosis not present

## 2019-01-16 LAB — SARS CORONAVIRUS 2 (TAT 6-24 HRS): SARS Coronavirus 2: NEGATIVE

## 2019-01-16 NOTE — Telephone Encounter (Signed)
Received voicemail from [place], patient's daughter. [facility] contacted patient's daughter stating that the measurements sent to them "seem off". Monica requesting clinical staff re-fax or contact [facility] to confirm measurements.

## 2019-01-17 ENCOUNTER — Other Ambulatory Visit: Payer: Self-pay

## 2019-01-17 ENCOUNTER — Encounter (HOSPITAL_COMMUNITY): Payer: Self-pay | Admitting: *Deleted

## 2019-01-17 ENCOUNTER — Telehealth: Payer: Self-pay | Admitting: Pharmacist

## 2019-01-17 DIAGNOSIS — Z4801 Encounter for change or removal of surgical wound dressing: Secondary | ICD-10-CM | POA: Diagnosis not present

## 2019-01-17 DIAGNOSIS — Z4789 Encounter for other orthopedic aftercare: Secondary | ICD-10-CM | POA: Diagnosis not present

## 2019-01-17 NOTE — Anesthesia Preprocedure Evaluation (Addendum)
Anesthesia Evaluation  Patient identified by [name], date of birth, ID band Patient awake    Reviewed: Allergy & Precautions, NPO status , Patient's Chart, lab work & pertinent test results  Airway Mallampati: I  TM Distance: >3 FB Neck ROM: Full    Dental   Pulmonary COPD, Current Smoker,    Pulmonary exam normal        Cardiovascular hypertension, + CAD  Normal cardiovascular exam+ Valvular Problems/Murmurs   Mechanical AVR with good function   Neuro/Psych    GI/Hepatic   Endo/Other    Renal/GU      Musculoskeletal   Abdominal   Peds  Hematology   Anesthesia Other Findings   Reproductive/Obstetrics                            Anesthesia Physical Anesthesia Plan  ASA: III  Anesthesia Plan: General   Post-op Pain Management:    Induction: Intravenous  PONV Risk Score and Plan: 2 and Ondansetron and Midazolam  Airway Management Planned: Oral ETT  Additional Equipment:   Intra-op Plan:   Post-operative Plan: Extubation in OR  Informed Consent: I have reviewed the patients History and Physical, chart, labs and discussed the procedure including the risks, benefits and alternatives for the proposed anesthesia with the patient or authorized representative who has indicated his/her understanding and acceptance.       Plan Discussed with: CRNA and Surgeon  Anesthesia Plan Comments: (PAT note written 01/17/2019 by Myra Gianotti, PA-C. SAME DAY WORK-UP   )       Anesthesia Quick Evaluation

## 2019-01-17 NOTE — Telephone Encounter (Signed)
PST FS POC INR this morning = 1.9 while warfarin "ON-HOLD" for planned plastic surgery tomorrow. Discussed with Dr. Marla Roe. INR acceptable at present--but with continued omitted doses, INR will be LESS tomorrow. Will re-collect (Patient Self Testing, Finger stick, point of care/in-home monitor) tomorrow AM early and document tomorrow's value in CHL/EPIC as the final INR going to surgery. Will also advise patient's daughter to have patient eat a dark-green-leafy salad today to help facilitate further decline in INR. Has been on [facility] bridge therapy during her off-warfarin time.

## 2019-01-17 NOTE — Progress Notes (Addendum)
I spoke to Kim Anthony and her daughter Kim Anthony.  Patient denies chest pain or shortness of breath.  Patient was negative for covid 19 on 01/16/2019 and has been in quarantine.   Kim Anthony has been on Coumadin for history of PE, PT/INR was 3.0 on 7/23, which was the dayof last dose of Coumadin.  Kim Anthony has been on Lovenox, last dose was this am- 60mg . Kim Anthony checks INR at home and is in communication with Kim Anthony, PharmD, INR was 1.9 this am.  Kim Anthony is to check INR in am and text it to [place] and he will document it in patient's record. I sent a inbox message to Dr. Marla Roe asking if we need to check INR when patient arrives in the hospital. I asked anesthesiology PA-C to review chart.

## 2019-01-17 NOTE — Progress Notes (Signed)
Anesthesia Chart Review: SAME DAY WORK-UP   Case: [id number] Date/Time: 01/18/19 1345   Procedure: LEFT LATISSIMUS FLAP TO BREAST (Left )   Anesthesia type: General   Pre-op diagnosis: HISTORY OF BREAST CANCER   Location: [facility] OR ROOM 08 / [facility]   Surgeon: Wallace Going, DO      DISCUSSION: Patient is a 68 year old female scheduled for the above procedure. Patient with history of left breast cancer, s/p excision of chronic left chest well wound with complex closure on 12/28/2018.  History includes smoking, COPD, HTN, HLD, asthma, mechanical MVR, chronic combined systolic and diastolic CHF, CAD (non-specified), left breat cancer (s/p left mastectomy, axillary dissection, post-op radiation ~ 2001), PE (LUL PE and 07/10/15 in setting of not taking warfarin x 5 days due to running out after moving to [place]), RUE DVT (~2002 in setting of breast cancer treatment),   Patient moved to [facility] in 2017. She has been followed at the [facility] since just after her 06/2015 PE admission when she ran out of warfarin. Her warfarin is managed by Jorene Guest, RPH-CPP at their Cou[facility]. She has not been referred to cardiology while under their care (that I can see), but she has had two echocardiograms, last in 11/2017 which showed EF 50-55%, normal LV systolic function, mechanical mitral valve. Currently, no old records regarding her cardiac/valvular disease are available. It appears that MVR was prior to 11/20/10 based on old CXR report.   POC INR on 01/17/19 1.9. Jorene Guest, RPH-CPP discussed with Dr. Marla Roe and wrote, "INR acceptable at present--but with continued omitted doses, INR will be LESS tomorrow. Will re-collect (Patient Self Testing, Finger stick, point of care/in-home monitor) tomorrow AM early and document tomorrow's value in CHL/EPIC as the final INR going to surgery. Will also advise patient's daughter to have patient eat a dark-green-leafy salad  today to help facilitate further decline in INR. Has been on LMWH bridge therapy during her off-warfarin time."  Presurgical COVID-19 test negative on 01/16/2019. Last Lovenox 01/17/19 AM. She tolerated recent surgery earlier this month. Anesthesia team to evaluate on the day of surgery as she is a same day work-up. She did have labs on 01/12/19.    PROVIDERSMarianna Payment, MD is listed as PCP   LABS: As of 01/12/19, labs include: Lab Results  Component Value Date   WBC 8.7 01/12/2019   HGB 12.3 01/12/2019   HCT 40.3 01/12/2019   PLT 614 (H) 01/12/2019   GLUCOSE 91 01/12/2019   ALT 22 01/12/2019   AST 33 01/12/2019   NA 139 01/12/2019   K 3.4 (L) 01/12/2019   CL 106 01/12/2019   CREATININE 0.57 01/12/2019   BUN 13 01/12/2019   CO2 22 01/12/2019   INR 3.0 (H) 01/12/2019  POC INR on 01/17/19 1.9.     PFTs 12/22/17: FVC 1.83 (72%), post 1.98 (77%). FEV1 1.50 (76%), post 1.58 (79%). DLCO unc 7.63 (29%).    IMAGES: 1V CXR 01/07/19: IMPRESSION: Mild, diffuse bilateral interstitial pulmonary opacity, likely edema, and underlying emphysematous change. No focal airspace opacity. Small pulmonary nodules better characterized by prior CT. Cardiomegaly status post median sternotomy with mitral valve Prosthesis.  CT Chest 10/07/18: IMPRESSION: 1. Post therapeutic changes including previous radiation and surgery to the left chest wall and left axilla. No convincing evidence of recurrence. MRI of the left chest wall would be more sensitive for recurrence if there is clinical concern. 2. No abscess identified in the left axillar chest  wall to correlate with the history of a nonhealing wound. 3. Post radiation changes in the left anterior lung. 4. Nodularity in the right lung is thought to be stable. The nodule in the right upper lobe is a little more prominent but this change is thought to be due to difference in slice selection. Recommend a follow-up CT scan in 1 year to ensure  stability. 5. Emphysematous changes in the lungs. 6. Atherosclerotic changes in the thoracic aorta.   EKG: 01/12/19: Sinus rhythm with first degree AV block Baseline wander in lead(s) V4 When compared with ECG of 04/11/2018, First degree A-V block is now present Confirmed by Delora Fuel ([id number]) on 01/13/2019 1:41:53 PM   CV: UE Venous Duplex 01/07/19: Summary: - Right: No evidence of thrombosis in the subclavian. - Left: No evidence of deep vein thrombosis in the upper extremity. However, unable to visualize the axillary, basilic. No evidence of superficial vein thrombosis in the upper extremity. However, unable to visualize the axillary, basilic. No evidence of thrombosis in the . However, unable to visualize the axillary, basilic. This was a limited study.    TTE 12/07/17: Study Conclusions: - Left ventricle: The cavity size was normal. Wall thickness was   normal. Systolic function was normal. The estimated ejection   fraction was in the range of 50% to 55%. Doppler parameters are   consistent with abnormal left ventricular relaxation (grade 1   diastolic dysfunction). - Mitral valve: A mechanical prosthesis was present. - Aortic valve:   Structurally normal valve.   Cusp separation was   normal.  Doppler:  Transvalvular velocity was within the normal   range. There was no stenosis. There was no regurgitation. - Pulmonary arteries: Systolic pressure was mildly increased. PA   peak pressure: 36 mm Hg (S).    Past Medical History:  Diagnosis Date  . Arthritis   . Asthma   . Breast cancer ([place])    Left Breast Cancer  . CHF (congestive heart failure) ([place])   . COPD (chronic obstructive pulmonary disease) ([place])   . Coronary artery disease   . History of mitral valve replacement with mechanical valve   . HLD (hyperlipidemia)   . Hypertension   . Pulmonary embolism [facility])     Past Surgical History:  Procedure Laterality Date  . COLONOSCOPY WITH PROPOFOL N/A  06/07/2018   Procedure: COLONOSCOPY WITH PROPOFOL;  Surgeon: Wilford Corner, MD;  Location: [facility];  Service: Endoscopy;  Laterality: N/A;  . DEBRIDEMENT AND CLOSURE WOUND Left 12/28/2018   Procedure: Debridement And Closure Wound;  Surgeon: Coralie Keens, MD;  Location: [facility];  Service: General;  Laterality: Left;  [name] MASTECTOMY Left 2000s   for breast cancer, also s/p radiation and chemo  . MECHANICAL AORTIC AND MITRAL VALVE REPLACEMENT    . POLYPECTOMY  06/07/2018   Procedure: POLYPECTOMY;  Surgeon: Wilford Corner, MD;  Location: [facility];  Service: Endoscopy;;  . WOUND DEBRIDEMENT Left 12/28/2018   Procedure: EXCISION OF LEFT CHRONIC CHEST WALL WOUND;  Surgeon: Coralie Keens, MD;  Location: [place];  Service: General;  Laterality: Left;    MEDICATIONS: No current facility-administered medications for this encounter.    [name] albuterol (PROVENTIL) (2.5 MG/3ML) 0.083% nebulizer solution  . albuterol (VENTOLIN HFA) 108 (90 Base) MCG/ACT inhaler  . ANORO ELLIPTA 62.5-25 MCG/INH AEPB  . Calcium-Phosphorus-Vitamin D (CITRACAL +D3 PO)  . diazepam (VALIUM) 5 MG tablet  . enoxaparin (LOVENOX) 120 MG/0.8ML injection  . enoxaparin (LOVENOX) 80 MG/0.8ML injection  . fluticasone (  FLONASE) 50 MCG/ACT nasal spray  . furosemide (LASIX) 40 MG tablet  . gabapentin (NEURONTIN) 600 MG tablet  . HYDROcodone-acetaminophen (NORCO) 5-325 MG tablet  . lisinopril (ZESTRIL) 10 MG tablet  . metoprolol succinate (TOPROL-XL) 25 MG 24 hr tablet  . potassium chloride SA (K-DUR) 20 MEQ tablet  . simvastatin (ZOCOR) 20 MG tablet  . warfarin (COUMADIN) 5 MG tablet    Myra Gianotti, PA-C Surgical Short Stay/Anesthesiology [facility] Phone [phone] [facility] Phone [phone] 01/17/2019 5:34 PM

## 2019-01-18 ENCOUNTER — Inpatient Hospital Stay (HOSPITAL_COMMUNITY): Payer: Medicare Other | Admitting: Vascular Surgery

## 2019-01-18 ENCOUNTER — Encounter (HOSPITAL_COMMUNITY)
Admission: RE | Disposition: A | Discharge status: Home Health | Payer: Self-pay | Source: Home / Self Care | Attending: Internal Medicine

## 2019-01-18 ENCOUNTER — Inpatient Hospital Stay (HOSPITAL_COMMUNITY)
Admission: RE | Admit: 2019-01-18 | Discharge: 2019-02-01 | DRG: 901 | Disposition: A | Discharge status: Home Health | Payer: Medicare Other | Attending: Internal Medicine | Admitting: Internal Medicine

## 2019-01-18 ENCOUNTER — Telehealth: Payer: Self-pay | Admitting: Pharmacist

## 2019-01-18 ENCOUNTER — Encounter (HOSPITAL_COMMUNITY): Payer: Self-pay

## 2019-01-18 DIAGNOSIS — Z9012 Acquired absence of left breast and nipple: Secondary | ICD-10-CM

## 2019-01-18 DIAGNOSIS — T8144XA Sepsis following a procedure, initial encounter: Secondary | ICD-10-CM | POA: Diagnosis not present

## 2019-01-18 DIAGNOSIS — D62 Acute posthemorrhagic anemia: Secondary | ICD-10-CM | POA: Diagnosis not present

## 2019-01-18 DIAGNOSIS — J8410 Pulmonary fibrosis, unspecified: Secondary | ICD-10-CM | POA: Diagnosis not present

## 2019-01-18 DIAGNOSIS — Z7901 Long term (current) use of anticoagulants: Secondary | ICD-10-CM

## 2019-01-18 DIAGNOSIS — T8131XA Disruption of external operation (surgical) wound, not elsewhere classified, initial encounter: Secondary | ICD-10-CM | POA: Diagnosis not present

## 2019-01-18 DIAGNOSIS — A419 Sepsis, unspecified organism: Secondary | ICD-10-CM | POA: Diagnosis not present

## 2019-01-18 DIAGNOSIS — Y832 Surgical operation with anastomosis, bypass or graft as the cause of abnormal reaction of the patient, or of later complication, without mention of misadventure at the time of the procedure: Secondary | ICD-10-CM | POA: Diagnosis present

## 2019-01-18 DIAGNOSIS — L03313 Cellulitis of chest wall: Secondary | ICD-10-CM | POA: Diagnosis not present

## 2019-01-18 DIAGNOSIS — Z978 Presence of other specified devices: Secondary | ICD-10-CM | POA: Diagnosis not present

## 2019-01-18 DIAGNOSIS — E785 Hyperlipidemia, unspecified: Secondary | ICD-10-CM | POA: Diagnosis present

## 2019-01-18 DIAGNOSIS — L0390 Cellulitis, unspecified: Secondary | ICD-10-CM | POA: Diagnosis not present

## 2019-01-18 DIAGNOSIS — M25512 Pain in left shoulder: Secondary | ICD-10-CM | POA: Diagnosis present

## 2019-01-18 DIAGNOSIS — Z952 Presence of prosthetic heart valve: Secondary | ICD-10-CM

## 2019-01-18 DIAGNOSIS — I110 Hypertensive heart disease with heart failure: Secondary | ICD-10-CM | POA: Diagnosis present

## 2019-01-18 DIAGNOSIS — Z8249 Family history of ischemic heart disease and other diseases of the circulatory system: Secondary | ICD-10-CM

## 2019-01-18 DIAGNOSIS — L042 Acute lymphadenitis of upper limb: Secondary | ICD-10-CM | POA: Diagnosis not present

## 2019-01-18 DIAGNOSIS — I517 Cardiomegaly: Secondary | ICD-10-CM | POA: Diagnosis not present

## 2019-01-18 DIAGNOSIS — R509 Fever, unspecified: Secondary | ICD-10-CM | POA: Diagnosis not present

## 2019-01-18 DIAGNOSIS — Z923 Personal history of irradiation: Secondary | ICD-10-CM | POA: Diagnosis not present

## 2019-01-18 DIAGNOSIS — R000 Tachycardia, unspecified: Secondary | ICD-10-CM | POA: Diagnosis not present

## 2019-01-18 DIAGNOSIS — T8132XA Disruption of internal operation (surgical) wound, not elsewhere classified, initial encounter: Secondary | ICD-10-CM | POA: Diagnosis not present

## 2019-01-18 DIAGNOSIS — G8929 Other chronic pain: Secondary | ICD-10-CM | POA: Diagnosis present

## 2019-01-18 DIAGNOSIS — I5042 Chronic combined systolic (congestive) and diastolic (congestive) heart failure: Secondary | ICD-10-CM | POA: Diagnosis not present

## 2019-01-18 DIAGNOSIS — S21002A Unspecified open wound of left breast, initial encounter: Secondary | ICD-10-CM | POA: Diagnosis not present

## 2019-01-18 DIAGNOSIS — I10 Essential (primary) hypertension: Secondary | ICD-10-CM | POA: Diagnosis not present

## 2019-01-18 DIAGNOSIS — J9621 Acute and chronic respiratory failure with hypoxia: Secondary | ICD-10-CM | POA: Diagnosis not present

## 2019-01-18 DIAGNOSIS — Z20828 Contact with and (suspected) exposure to other viral communicable diseases: Secondary | ICD-10-CM | POA: Diagnosis present

## 2019-01-18 DIAGNOSIS — N611 Abscess of the breast and nipple: Secondary | ICD-10-CM | POA: Diagnosis not present

## 2019-01-18 DIAGNOSIS — J449 Chronic obstructive pulmonary disease, unspecified: Secondary | ICD-10-CM | POA: Diagnosis present

## 2019-01-18 DIAGNOSIS — L02419 Cutaneous abscess of limb, unspecified: Secondary | ICD-10-CM | POA: Diagnosis not present

## 2019-01-18 DIAGNOSIS — F419 Anxiety disorder, unspecified: Secondary | ICD-10-CM | POA: Diagnosis present

## 2019-01-18 DIAGNOSIS — E876 Hypokalemia: Secondary | ICD-10-CM | POA: Diagnosis not present

## 2019-01-18 DIAGNOSIS — D638 Anemia in other chronic diseases classified elsewhere: Secondary | ICD-10-CM | POA: Diagnosis present

## 2019-01-18 DIAGNOSIS — Z4801 Encounter for change or removal of surgical wound dressing: Secondary | ICD-10-CM | POA: Diagnosis not present

## 2019-01-18 DIAGNOSIS — L02213 Cutaneous abscess of chest wall: Secondary | ICD-10-CM | POA: Diagnosis not present

## 2019-01-18 DIAGNOSIS — I509 Heart failure, unspecified: Secondary | ICD-10-CM | POA: Diagnosis not present

## 2019-01-18 DIAGNOSIS — Z853 Personal history of malignant neoplasm of breast: Secondary | ICD-10-CM

## 2019-01-18 DIAGNOSIS — Z9010 Acquired absence of unspecified breast and nipple: Secondary | ICD-10-CM | POA: Diagnosis not present

## 2019-01-18 DIAGNOSIS — J984 Other disorders of lung: Secondary | ICD-10-CM | POA: Diagnosis not present

## 2019-01-18 DIAGNOSIS — J439 Emphysema, unspecified: Secondary | ICD-10-CM | POA: Diagnosis not present

## 2019-01-18 DIAGNOSIS — T8142XA Infection following a procedure, deep incisional surgical site, initial encounter: Secondary | ICD-10-CM | POA: Diagnosis not present

## 2019-01-18 DIAGNOSIS — J90 Pleural effusion, not elsewhere classified: Secondary | ICD-10-CM | POA: Diagnosis not present

## 2019-01-18 DIAGNOSIS — Y842 Radiological procedure and radiotherapy as the cause of abnormal reaction of the patient, or of later complication, without mention of misadventure at the time of the procedure: Secondary | ICD-10-CM | POA: Diagnosis present

## 2019-01-18 DIAGNOSIS — J9601 Acute respiratory failure with hypoxia: Secondary | ICD-10-CM | POA: Diagnosis not present

## 2019-01-18 DIAGNOSIS — T8189XA Other complications of procedures, not elsewhere classified, initial encounter: Secondary | ICD-10-CM | POA: Diagnosis not present

## 2019-01-18 DIAGNOSIS — T8132XD Disruption of internal operation (surgical) wound, not elsewhere classified, subsequent encounter: Secondary | ICD-10-CM | POA: Diagnosis not present

## 2019-01-18 DIAGNOSIS — S21102A Unspecified open wound of left front wall of thorax without penetration into thoracic cavity, initial encounter: Secondary | ICD-10-CM | POA: Diagnosis not present

## 2019-01-18 DIAGNOSIS — F17210 Nicotine dependence, cigarettes, uncomplicated: Secondary | ICD-10-CM | POA: Diagnosis present

## 2019-01-18 DIAGNOSIS — Z4789 Encounter for other orthopedic aftercare: Secondary | ICD-10-CM | POA: Diagnosis not present

## 2019-01-18 DIAGNOSIS — M5441 Lumbago with sciatica, right side: Secondary | ICD-10-CM | POA: Diagnosis not present

## 2019-01-18 DIAGNOSIS — R5082 Postprocedural fever: Secondary | ICD-10-CM

## 2019-01-18 DIAGNOSIS — I2699 Other pulmonary embolism without acute cor pulmonale: Secondary | ICD-10-CM | POA: Diagnosis not present

## 2019-01-18 DIAGNOSIS — R0902 Hypoxemia: Secondary | ICD-10-CM | POA: Diagnosis not present

## 2019-01-18 DIAGNOSIS — Z872 Personal history of diseases of the skin and subcutaneous tissue: Secondary | ICD-10-CM | POA: Diagnosis not present

## 2019-01-18 DIAGNOSIS — R6520 Severe sepsis without septic shock: Secondary | ICD-10-CM | POA: Diagnosis present

## 2019-01-18 DIAGNOSIS — I361 Nonrheumatic tricuspid (valve) insufficiency: Secondary | ICD-10-CM | POA: Diagnosis not present

## 2019-01-18 DIAGNOSIS — Z421 Encounter for breast reconstruction following mastectomy: Secondary | ICD-10-CM | POA: Diagnosis not present

## 2019-01-18 DIAGNOSIS — X58XXXA Exposure to other specified factors, initial encounter: Secondary | ICD-10-CM | POA: Diagnosis not present

## 2019-01-18 DIAGNOSIS — Z803 Family history of malignant neoplasm of breast: Secondary | ICD-10-CM

## 2019-01-18 DIAGNOSIS — I5032 Chronic diastolic (congestive) heart failure: Secondary | ICD-10-CM | POA: Diagnosis not present

## 2019-01-18 DIAGNOSIS — B9689 Other specified bacterial agents as the cause of diseases classified elsewhere: Secondary | ICD-10-CM | POA: Diagnosis not present

## 2019-01-18 DIAGNOSIS — M7989 Other specified soft tissue disorders: Secondary | ICD-10-CM | POA: Diagnosis not present

## 2019-01-18 HISTORY — PX: LATISSIMUS FLAP TO BREAST: SHX5357

## 2019-01-18 HISTORY — DX: Unspecified osteoarthritis, unspecified site: M19.90

## 2019-01-18 SURGERY — RECONSTRUCTION, BREAST, USING LATISSIMUS DORSI MYOCUTANEOUS FLAP
Anesthesia: General | Laterality: Left

## 2019-01-18 MED ORDER — CEFAZOLIN SODIUM-DEXTROSE 2-3 GM-%(50ML) IV SOLR
INTRAVENOUS | Status: DC | PRN
  Administered 2019-01-18: 2 g via INTRAVENOUS

## 2019-01-18 MED ORDER — WARFARIN SODIUM 5 MG PO TABS
5.0000 mg | ORAL_TABLET | Freq: Once | ORAL | Status: AC
  Administered 2019-01-18: 5 mg via ORAL
  Filled 2019-01-18: qty 1

## 2019-01-18 MED ORDER — FENTANYL CITRATE (PF) 250 MCG/5ML IJ SOLN
INTRAMUSCULAR | Status: AC
  Filled 2019-01-18: qty 5

## 2019-01-18 MED ORDER — HYDROMORPHONE HCL 1 MG/ML IJ SOLN
0.2500 mg | INTRAMUSCULAR | Status: DC | PRN
  Administered 2019-01-18 (×3): 0.5 mg via INTRAVENOUS

## 2019-01-18 MED ORDER — HYDROMORPHONE HCL 1 MG/ML IJ SOLN
INTRAMUSCULAR | Status: AC
  Filled 2019-01-18: qty 1

## 2019-01-18 MED ORDER — EVICEL 5 ML EX KIT
PACK | CUTANEOUS | Status: DC | PRN
  Administered 2019-01-18: 1

## 2019-01-18 MED ORDER — CEFAZOLIN SODIUM-DEXTROSE 2-4 GM/100ML-% IV SOLN
INTRAVENOUS | Status: AC
  Filled 2019-01-18: qty 100

## 2019-01-18 MED ORDER — ROCURONIUM BROMIDE 50 MG/5ML IV SOSY
PREFILLED_SYRINGE | INTRAVENOUS | Status: DC | PRN
  Administered 2019-01-18: 20 mg via INTRAVENOUS
  Administered 2019-01-18: 70 mg via INTRAVENOUS

## 2019-01-18 MED ORDER — ONDANSETRON 4 MG PO TBDP: 4.0000 mg | ORAL_TABLET | Freq: Four times a day (QID) | ORAL | Status: DC | PRN

## 2019-01-18 MED ORDER — MIDAZOLAM HCL 2 MG/2ML IJ SOLN
INTRAMUSCULAR | Status: AC
  Filled 2019-01-18: qty 2

## 2019-01-18 MED ORDER — OXYCODONE-ACETAMINOPHEN 5-325 MG PO TABS
ORAL_TABLET | ORAL | Status: AC
  Filled 2019-01-18: qty 2

## 2019-01-18 MED ORDER — DIPHENHYDRAMINE HCL 12.5 MG/5ML PO ELIX
12.5000 mg | ORAL_SOLUTION | Freq: Four times a day (QID) | ORAL | Status: DC | PRN
  Administered 2019-01-31: 23:00:00 12.5 mg via ORAL
  Filled 2019-01-18: qty 5

## 2019-01-18 MED ORDER — ONDANSETRON HCL 4 MG/2ML IJ SOLN
4.0000 mg | Freq: Four times a day (QID) | INTRAMUSCULAR | Status: DC | PRN
  Administered 2019-01-27: 22:00:00 4 mg via INTRAVENOUS
  Filled 2019-01-18: qty 2

## 2019-01-18 MED ORDER — ENOXAPARIN SODIUM 60 MG/0.6ML ~~LOC~~ SOLN
60.0000 mg | Freq: Once | SUBCUTANEOUS | Status: AC
  Administered 2019-01-18: 21:00:00 60 mg via SUBCUTANEOUS
  Filled 2019-01-18: qty 0.6

## 2019-01-18 MED ORDER — LACTATED RINGERS IV SOLN
INTRAVENOUS | Status: DC
  Administered 2019-01-18: 12:00:00 via INTRAVENOUS

## 2019-01-18 MED ORDER — KCL IN DEXTROSE-NACL 20-5-0.45 MEQ/L-%-% IV SOLN
INTRAVENOUS | Status: DC
  Administered 2019-01-18 – 2019-01-19 (×4): via INTRAVENOUS
  Filled 2019-01-18 (×3): qty 1000

## 2019-01-18 MED ORDER — SODIUM CHLORIDE 0.9 % IV SOLN
INTRAVENOUS | Status: AC
  Filled 2019-01-18: qty 500000

## 2019-01-18 MED ORDER — DIAZEPAM 2 MG PO TABS
2.0000 mg | ORAL_TABLET | Freq: Two times a day (BID) | ORAL | Status: DC | PRN
  Administered 2019-01-19 – 2019-01-31 (×8): 2 mg via ORAL
  Filled 2019-01-18 (×8): qty 1

## 2019-01-18 MED ORDER — CEFAZOLIN SODIUM-DEXTROSE 2-4 GM/100ML-% IV SOLN
2.0000 g | Freq: Three times a day (TID) | INTRAVENOUS | Status: DC
  Administered 2019-01-18 – 2019-01-19 (×3): 2 g via INTRAVENOUS
  Filled 2019-01-18 (×5): qty 100

## 2019-01-18 MED ORDER — ONDANSETRON HCL 4 MG/2ML IJ SOLN
INTRAMUSCULAR | Status: DC | PRN
  Administered 2019-01-18: 4 mg via INTRAVENOUS

## 2019-01-18 MED ORDER — ENOXAPARIN SODIUM 80 MG/0.8ML ~~LOC~~ SOLN
80.0000 mg | Freq: Two times a day (BID) | SUBCUTANEOUS | Status: DC
  Administered 2019-01-19 – 2019-01-26 (×15): 80 mg via SUBCUTANEOUS
  Filled 2019-01-18 (×18): qty 0.8

## 2019-01-18 MED ORDER — MORPHINE SULFATE (PF) 2 MG/ML IV SOLN
2.0000 mg | INTRAVENOUS | Status: DC | PRN
  Administered 2019-01-19 (×2): 2 mg via INTRAVENOUS
  Filled 2019-01-18 (×2): qty 1

## 2019-01-18 MED ORDER — WARFARIN - PHARMACIST DOSING INPATIENT
Freq: Every day | Status: DC
  Administered 2019-01-20 – 2019-01-25 (×6)

## 2019-01-18 MED ORDER — PROPOFOL 10 MG/ML IV BOLUS
INTRAVENOUS | Status: AC
  Filled 2019-01-18: qty 20

## 2019-01-18 MED ORDER — ONDANSETRON HCL 4 MG/2ML IJ SOLN
INTRAMUSCULAR | Status: AC
  Filled 2019-01-18: qty 2

## 2019-01-18 MED ORDER — PHENYLEPHRINE HCL (PRESSORS) 10 MG/ML IV SOLN
INTRAVENOUS | Status: DC | PRN
  Administered 2019-01-18: 80 ug via INTRAVENOUS

## 2019-01-18 MED ORDER — NAPROXEN 250 MG PO TABS: 500.0000 mg | ORAL_TABLET | Freq: Two times a day (BID) | ORAL | Status: DC | PRN

## 2019-01-18 MED ORDER — FENTANYL CITRATE (PF) 250 MCG/5ML IJ SOLN
INTRAMUSCULAR | Status: DC | PRN
  Administered 2019-01-18 (×2): 50 ug via INTRAVENOUS
  Administered 2019-01-18: 25 ug via INTRAVENOUS
  Administered 2019-01-18: 50 ug via INTRAVENOUS
  Administered 2019-01-18: 25 ug via INTRAVENOUS
  Administered 2019-01-18 (×2): 50 ug via INTRAVENOUS
  Administered 2019-01-18: 100 ug via INTRAVENOUS
  Administered 2019-01-18: 50 ug via INTRAVENOUS

## 2019-01-18 MED ORDER — PROPOFOL 10 MG/ML IV BOLUS
INTRAVENOUS | Status: DC | PRN
  Administered 2019-01-18: 100 mg via INTRAVENOUS

## 2019-01-18 MED ORDER — SUGAMMADEX SODIUM 200 MG/2ML IV SOLN
INTRAVENOUS | Status: DC | PRN
  Administered 2019-01-18: 200 mg via INTRAVENOUS

## 2019-01-18 MED ORDER — BISACODYL 10 MG RE SUPP: 10.0000 mg | Freq: Every day | RECTAL | Status: DC | PRN

## 2019-01-18 MED ORDER — BUPIVACAINE-EPINEPHRINE (PF) 0.25% -1:200000 IJ SOLN
INTRAMUSCULAR | Status: AC
  Filled 2019-01-18: qty 30

## 2019-01-18 MED ORDER — EVICEL 5 ML EX KIT
PACK | CUTANEOUS | Status: AC
  Filled 2019-01-18: qty 1

## 2019-01-18 MED ORDER — BUPIVACAINE-EPINEPHRINE 0.25% -1:200000 IJ SOLN
INTRAMUSCULAR | Status: DC | PRN
  Administered 2019-01-18: 10 mL

## 2019-01-18 MED ORDER — DIPHENHYDRAMINE HCL 50 MG/ML IJ SOLN
12.5000 mg | Freq: Four times a day (QID) | INTRAMUSCULAR | Status: DC | PRN
  Administered 2019-01-25 – 2019-01-26 (×2): 12.5 mg via INTRAVENOUS
  Filled 2019-01-18 (×2): qty 1

## 2019-01-18 MED ORDER — OXYCODONE-ACETAMINOPHEN 5-325 MG PO TABS
1.0000 | ORAL_TABLET | ORAL | Status: DC | PRN
  Administered 2019-01-18 – 2019-01-29 (×30): 2 via ORAL
  Administered 2019-01-29: 1 via ORAL
  Administered 2019-01-29 – 2019-02-01 (×12): 2 via ORAL
  Filled 2019-01-18 (×26): qty 2
  Filled 2019-01-18: qty 1
  Filled 2019-01-18 (×16): qty 2

## 2019-01-18 MED ORDER — PROMETHAZINE HCL 25 MG/ML IJ SOLN: 6.2500 mg | INTRAMUSCULAR | Status: DC | PRN

## 2019-01-18 MED ORDER — LIDOCAINE 2% (20 MG/ML) 5 ML SYRINGE
INTRAMUSCULAR | Status: DC | PRN
  Administered 2019-01-18: 50 mg via INTRAVENOUS

## 2019-01-18 MED ORDER — ACETAMINOPHEN 325 MG PO TABS
650.0000 mg | ORAL_TABLET | Freq: Four times a day (QID) | ORAL | Status: DC | PRN
  Administered 2019-01-20: 08:00:00 650 mg via ORAL
  Filled 2019-01-18: qty 2

## 2019-01-18 MED ORDER — MIDAZOLAM HCL 5 MG/5ML IJ SOLN
INTRAMUSCULAR | Status: DC | PRN
  Administered 2019-01-18: 1 mg via INTRAVENOUS

## 2019-01-18 MED ORDER — SENNA 8.6 MG PO TABS
1.0000 | ORAL_TABLET | Freq: Two times a day (BID) | ORAL | Status: DC
  Administered 2019-01-18 – 2019-02-01 (×20): 8.6 mg via ORAL
  Filled 2019-01-18 (×24): qty 1

## 2019-01-18 MED ORDER — POLYETHYLENE GLYCOL 3350 17 G PO PACK: 17.0000 g | PACK | Freq: Every day | ORAL | Status: DC | PRN

## 2019-01-18 SURGICAL SUPPLY — 70 items
APPLIER CLIP 9.375 MED OPEN (MISCELLANEOUS) ×3
BAG DECANTER FOR FLEXI CONT (MISCELLANEOUS) ×3 IMPLANT
BINDER BREAST XLRG (GAUZE/BANDAGES/DRESSINGS) ×3 IMPLANT
BIOPATCH RED 1 DISK 7.0 (GAUZE/BANDAGES/DRESSINGS) ×10 IMPLANT
BIOPATCH RED 1IN DISK 7.0MM (GAUZE/BANDAGES/DRESSINGS) ×5
BLADE SURG 10 STRL SS (BLADE) ×9 IMPLANT
BLADE SURG 15 STRL LF DISP TIS (BLADE) ×2 IMPLANT
BLADE SURG 15 STRL SS (BLADE) ×4
CANISTER SUCT 3000ML PPV (MISCELLANEOUS) ×3 IMPLANT
CHLORAPREP W/TINT 26 (MISCELLANEOUS) ×3 IMPLANT
CLIP APPLIE 9.375 MED OPEN (MISCELLANEOUS) ×1 IMPLANT
CLOSURE WOUND 1/2 X4 (GAUZE/BANDAGES/DRESSINGS)
CONNECTOR 5 IN 1 STRAIGHT STRL (MISCELLANEOUS) ×3 IMPLANT
COVER SURGICAL LIGHT HANDLE (MISCELLANEOUS) ×3 IMPLANT
DECANTER SPIKE VIAL GLASS SM (MISCELLANEOUS) ×3 IMPLANT
DERMABOND ADVANCED (GAUZE/BANDAGES/DRESSINGS) ×4
DERMABOND ADVANCED .7 DNX12 (GAUZE/BANDAGES/DRESSINGS) ×2 IMPLANT
DRAIN CHANNEL 19F RND (DRAIN) ×15 IMPLANT
DRAPE CHEST BREAST 15X10 FENES (DRAPES) ×3 IMPLANT
DRAPE HALF SHEET 40X57 (DRAPES) ×6 IMPLANT
DRAPE INCISE 23X17 IOBAN STRL (DRAPES) ×2
DRAPE INCISE IOBAN 23X17 STRL (DRAPES) ×1 IMPLANT
DRAPE INCISE IOBAN 85X60 (DRAPES) IMPLANT
DRAPE ORTHO SPLIT 77X108 STRL (DRAPES) ×4
DRAPE SURG ORHT 6 SPLT 77X108 (DRAPES) ×2 IMPLANT
DRAPE WARM FLUID 44X44 (DRAPES) ×3 IMPLANT
DRSG MEPILEX BORDER 4X8 (GAUZE/BANDAGES/DRESSINGS) ×6 IMPLANT
DRSG PAD ABDOMINAL 8X10 ST (GAUZE/BANDAGES/DRESSINGS) ×3 IMPLANT
ELECT BLADE 4.0 EZ CLEAN MEGAD (MISCELLANEOUS) ×6
ELECT BLADE 6.5 EXT (BLADE) IMPLANT
ELECT CAUTERY BLADE 6.4 (BLADE) IMPLANT
ELECT REM PT RETURN 9FT ADLT (ELECTROSURGICAL) ×3
ELECTRODE BLDE 4.0 EZ CLN MEGD (MISCELLANEOUS) ×2 IMPLANT
ELECTRODE REM PT RTRN 9FT ADLT (ELECTROSURGICAL) ×1 IMPLANT
EVACUATOR SILICONE 100CC (DRAIN) ×15 IMPLANT
GAUZE SPONGE 4X4 12PLY STRL (GAUZE/BANDAGES/DRESSINGS) ×6 IMPLANT
GAUZE SPONGE 4X4 12PLY STRL LF (GAUZE/BANDAGES/DRESSINGS) ×6 IMPLANT
GLOVE BIO SURGEON STRL SZ 6 (GLOVE) ×3 IMPLANT
GLOVE BIO SURGEON STRL SZ 6.5 (GLOVE) ×4 IMPLANT
GLOVE BIO SURGEON STRL SZ7 (GLOVE) ×18 IMPLANT
GLOVE BIO SURGEONS STRL SZ 6.5 (GLOVE) ×2
GOWN STRL REUS W/ TWL LRG LVL3 (GOWN DISPOSABLE) ×6 IMPLANT
GOWN STRL REUS W/TWL LRG LVL3 (GOWN DISPOSABLE) ×12
KIT BASIN OR (CUSTOM PROCEDURE TRAY) ×3 IMPLANT
NEEDLE HYPO 25GX1X1/2 BEV (NEEDLE) ×3 IMPLANT
NS IRRIG 1000ML POUR BTL (IV SOLUTION) ×6 IMPLANT
PACK GENERAL/GYN (CUSTOM PROCEDURE TRAY) ×3 IMPLANT
PAD ARMBOARD 7.5X6 YLW CONV (MISCELLANEOUS) ×3 IMPLANT
PENCIL BUTTON HOLSTER BLD 10FT (ELECTRODE) ×3 IMPLANT
PIN SAFETY STERILE (MISCELLANEOUS) ×9 IMPLANT
SLEEVE SUCTION 125 (MISCELLANEOUS) ×3 IMPLANT
SPONGE LAP 18X18 RF (DISPOSABLE) IMPLANT
STAPLER VISISTAT 35W (STAPLE) ×3 IMPLANT
STRIP CLOSURE SKIN 1/2X4 (GAUZE/BANDAGES/DRESSINGS) IMPLANT
SUT MNCRL AB 3-0 PS2 18 (SUTURE) ×12 IMPLANT
SUT MNCRL AB 4-0 PS2 18 (SUTURE) ×9 IMPLANT
SUT MON AB 3-0 SH 27 (SUTURE) ×2
SUT MON AB 3-0 SH27 (SUTURE) ×1 IMPLANT
SUT MON AB 5-0 PS2 18 (SUTURE) ×15 IMPLANT
SUT SILK 3 0 (SUTURE) ×4
SUT SILK 3-0 FS1 18XBRD (SUTURE) ×2 IMPLANT
SYR BULB IRRIGATION 50ML (SYRINGE) ×3 IMPLANT
SYR CONTROL 10ML LL (SYRINGE) ×3 IMPLANT
TAPE CLOTH SURG 4X10 WHT LF (GAUZE/BANDAGES/DRESSINGS) ×3 IMPLANT
TOWEL GREEN STERILE (TOWEL DISPOSABLE) ×3 IMPLANT
TOWEL GREEN STERILE FF (TOWEL DISPOSABLE) ×3 IMPLANT
TRAY FOLEY MTR SLVR 14FR STAT (SET/KITS/TRAYS/PACK) IMPLANT
TUBE CONNECTING 12'X1/4 (SUCTIONS) ×1
TUBE CONNECTING 12X1/4 (SUCTIONS) ×2 IMPLANT
YANKAUER SUCT BULB TIP NO VENT (SUCTIONS) ×3 IMPLANT

## 2019-01-18 NOTE — Discharge Instructions (Addendum)
INSTRUCTIONS FOR AFTER SURGERY   You are having surgery.  You will likely have some questions about what to expect following your operation.  The following information will help you and your family understand what to expect when you are discharged from the hospital.  Following these guidelines will help ensure a smooth recovery and reduce risks of complications.   Postoperative instructions include information on: diet, wound care, medications and physical activity.  AFTER SURGERY Expect to go home after the procedure.  In some cases, you may need to spend one night in the hospital for observation.  DIET This surgery does not require a specific diet.  However, I have to mention that the healthier you eat the better your body can start healing. It is important to increasing your protein intake.  This means limiting the foods with sugar and carbohydrates.  Focus on vegetables and some meat.  If you have any liposuction during your procedure be sure to drink water.  If your urine is bright yellow, then it is concentrated, and you need to drink more water.  As a general rule after surgery, you should have 8 ounces of water every hour while awake.  If you find you are persistently nauseated or unable to take in liquids let us know.  NO TOBACCO USE or EXPOSURE.  This will slow your healing process and increase the risk of a wound.  WOUND CARE While your drain is in place, clean with baby wipes until the drain is removed.   If you have steri-strips / tape directly attached to your skin leave them in place. It is OK to get these wet.  No baths, pools or hot tubs for two weeks. We close your incision to leave the smallest and best-looking scar. No ointment or creams on your incisions until given the go ahead.  Especially not Neosporin (Too many skin reactions with this one).  A few weeks after surgery you can use Mederma and start massaging the scar. We ask you to wear your binder or sports bra for the first  6 weeks around the clock, including while sleeping. This provides added comfort and helps reduce the fluid accumulation at the surgery site.  ACTIVITY No heavy lifting until cleared by the doctor.  It is OK to walk and climb stairs. In fact, moving your legs is very important to decrease your risk of a blood clot.  It will also help keep you from getting deconditioned.  Every 1 to 2 hours get up and walk for 5 minutes. This will help with a quicker recovery back to normal.  Let pain be your guide so you don't do too much.  NO, you cannot do the spring cleaning and don't plan on taking care of anyone else.  This is your time for TLC.  You will be more comfortable if you sleep and rest with your head elevated either with a few pillows under you or in a recliner.  No stomach sleeping for a few months.  WORK Everyone returns to work at different times. As a rough guide, most people take at least 1 - 2 weeks off prior to returning to work. If you need documentation for your job, bring the forms to your postoperative follow up visit.  DRIVING Arrange for someone to bring you home from the hospital.  You may be able to drive a few days after surgery but not while taking any narcotics or valium.  BOWEL MOVEMENTS Constipation can occur after anesthesia and  while taking pain medication.  It is important to stay ahead for your comfort.  We recommend taking Milk of Magnesia (2 tablespoons; twice a day) while taking the pain pills.  SEROMA This is fluid your body tried to put in the surgical site.  This is normal but if it creates tight skinny skin let us know.  It usually decreases in a few weeks.  WHEN TO CALL Call your surgeon's office if any of the following occur:  Fever 101 degrees F or greater  Excessive bleeding or fluid from the incision site.  Pain that increases over time without aid from the medications  Redness, warmth, or pus draining from incision sites  Persistent nausea or inability  to take in liquids  Severe misshapen area that underwent the operation.   ================================================================================== Information on my medicine - Coumadin   (Warfarin)  Why was Coumadin prescribed for you? Coumadin was prescribed for you because you have a blood clot or a medical condition that can cause an increased risk of forming blood clots. Blood clots can cause serious health problems by blocking the flow of blood to the heart, lung, or brain. Coumadin can prevent harmful blood clots from forming. As a reminder your indication for Coumadin is:   Blood Clot Prevention After Heart Valve Surgery and prior blood clots in the lungs. What test will check on my response to Coumadin? While on Coumadin (warfarin) you will need to have an INR test regularly to ensure that your dose is keeping you in the desired range. The INR (international normalized ratio) number is calculated from the result of the laboratory test called prothrombin time (PT).  If an INR APPOINTMENT HAS NOT ALREADY BEEN MADE FOR YOU please schedule an appointment to have this lab work done by your health care provider within 7 days. Your INR goal is usually a number between:  2 to 3 or your provider may give you a more narrow range like 2-2.5.  Ask your health care provider during an office visit what your goal INR is.  What  do you need to  know  About  COUMADIN? Take Coumadin (warfarin) exactly as prescribed by your healthcare provider about the same time each day.  DO NOT stop taking without talking to the doctor who prescribed the medication.  Stopping without other blood clot prevention medication to take the place of Coumadin may increase your risk of developing a new clot or stroke.  Get refills before you run out.  What do you do if you miss a dose? If you miss a dose, take it as soon as you remember on the same day then continue your regularly scheduled regimen the next day.  Do  not take two doses of Coumadin at the same time.  Important Safety Information A possible side effect of Coumadin (Warfarin) is an increased risk of bleeding. You should call your healthcare provider right away if you experience any of the following: ? Bleeding from an injury or your nose that does not stop. ? Unusual colored urine (red or dark brown) or unusual colored stools (red or black). ? Unusual bruising for unknown reasons. ? A serious fall or if you hit your head (even if there is no bleeding).  Some foods or medicines interact with Coumadin (warfarin) and might alter your response to warfarin. To help avoid this: ? Eat a balanced diet, maintaining a consistent amount of Vitamin K. ? Notify your provider about major diet changes you plan to make. ?  Avoid alcohol or limit your intake to 1 drink for women and 2 drinks for men per day. (1 drink is 5 oz. wine, 12 oz. beer, or 1.5 oz. liquor.)  Make sure that ANY health care provider who prescribes medication for you knows that you are taking Coumadin (warfarin).  Also make sure the healthcare provider who is monitoring your Coumadin knows when you have started a new medication including herbals and non-prescription products.  Coumadin (Warfarin)  Major Drug Interactions  Increased Warfarin Effect Decreased Warfarin Effect  Alcohol (large quantities) Antibiotics (esp. Septra/Bactrim, Flagyl, Cipro) Amiodarone (Cordarone) Aspirin (ASA) Cimetidine (Tagamet) Megestrol (Megace) NSAIDs (ibuprofen, naproxen, etc.) Piroxicam (Feldene) Propafenone (Rythmol SR) Propranolol (Inderal) Isoniazid (INH) Posaconazole (Noxafil) Barbiturates (Phenobarbital) Carbamazepine (Tegretol) Chlordiazepoxide (Librium) Cholestyramine (Questran) Griseofulvin Oral Contraceptives Rifampin Sucralfate (Carafate) Vitamin K   Coumadin (Warfarin) Major Herbal Interactions  Increased Warfarin Effect Decreased Warfarin Effect  Garlic Ginseng Ginkgo  biloba Coenzyme Q10 Green tea St. Johns wort    Coumadin (Warfarin) FOOD Interactions  Eat a consistent number of servings per week of foods HIGH in Vitamin K (1 serving =  cup)  Collards (cooked, or boiled & drained) Kale (cooked, or boiled & drained) Mustard greens (cooked, or boiled & drained) Parsley *serving size only =  cup Spinach (cooked, or boiled & drained) Swiss chard (cooked, or boiled & drained) Turnip greens (cooked, or boiled & drained)  Eat a consistent number of servings per week of foods MEDIUM-HIGH in Vitamin K (1 serving = 1 cup)  Asparagus (cooked, or boiled & drained) Broccoli (cooked, boiled & drained, or raw & chopped) Brussel sprouts (cooked, or boiled & drained) *serving size only =  cup Lettuce, raw (green leaf, endive, romaine) Spinach, raw Turnip greens, raw & chopped   These websites have more information on Coumadin (warfarin):  FailFactory.se; VeganReport.com.au;

## 2019-01-18 NOTE — Anesthesia Procedure Notes (Signed)
Procedure Name: Intubation Date/Time: 01/18/2019 1:52 PM Performed by: Glynda Jaeger, CRNA Pre-anesthesia Checklist: Patient identified, Patient being monitored, Timeout performed, Emergency Drugs available and Suction available Patient Re-evaluated:Patient Re-evaluated prior to induction Oxygen Delivery Method: Circle System Utilized Preoxygenation: Pre-oxygenation with 100% oxygen Induction Type: IV induction Ventilation: Mask ventilation without difficulty Laryngoscope Size: Mac and 4 Grade View: Grade II Tube type: Oral Tube size: 7.5 mm Number of attempts: 1 Airway Equipment and Method: Stylet Placement Confirmation: ETT inserted through vocal cords under direct vision,  positive ETCO2 and breath sounds checked- equal and bilateral Secured at: 22 cm Tube secured with: Tape Dental Injury: Teeth and Oropharynx as per pre-operative assessment

## 2019-01-18 NOTE — Progress Notes (Signed)
ANTICOAGULATION CONSULT NOTE - Initial Consult  Pharmacy Consult for Lovenox and Warfarin Indication: mechanical mitral valve and hx pulmonary embolism  Allergies  Allergen Reactions  . Acetaminophen-Codeine Itching  . Propoxyphene Itching    Darvocet  . Tramadol Itching and Nausea And Vomiting    Patient Measurements: Height: 5\' 5"  (165.1 cm) Weight: 171 lb (77.6 kg) IBW/kg (Calculated) : 57 Lovenox Dosing Weight: 77.6 kg  Vital Signs: Temp: 98.1 F (36.7 C) (07/29 1805) Temp Source: Oral (07/29 1805) BP: 146/89 (07/29 1805) Pulse Rate: 81 (07/29 1805)  Labs:  INR 1.2 on 7/29 am with home monitoring device  Estimated Creatinine Clearance: 68.3 mL/min (by C-G formula based on SCr of 0.57 mg/dL).   Medical History: Past Medical History:  Diagnosis Date  . Arthritis   . Asthma   . Breast cancer ([place])    Left Breast Cancer  . CHF (congestive heart failure) ([place])   . COPD (chronic obstructive pulmonary disease) ([place])   . Coronary artery disease   . History of mitral valve replacement with mechanical valve   . HLD (hyperlipidemia)   . Hypertension   . Pulmonary embolism [facility])    Assessment:   68 yr old female on Warfarin PTA for hx mechanical mitral valve and pulmonary embolism.  Has been off Warfarin since 7/23 for surgery today. Lovenox bridge begun on 7/24.  Outpatient warfarin management per [facility]. Discussed with Paulla Dolly, PharmD, who has been assisting with Lovenox bridging and has discussed with Dr. Marla Roe.  High risk bleeding procedure today, 3 drains in place.  EBL 150 cc. AET 1636. Anticoagulation to resume tonight, balancing bleeding risk and clotting risk. No bleeding or bloody drainage reported tonight.     PTA Warfarin:  5 mg daily except 7.5 mg on Wednesdays and Sundays.     Outpatient Lovenox:  120 mg SQ (~1.5 mg/kg) 7/24>>7/27.  Lovenox 60 mg SQ on 7/28 at 8am.       Pharmacy also consulted for pain control with  allergies.  Hx itching with Tylenol #3, Darvocet and Tramadol; also nausea and vomiting with Tramadol.  Using Norco PTA. Post-op orders for Morphine IV for severe pain, Percocet for moderate pain and Naproxen for mild pain.  Has used Morphine and Percocet in the past. Dilaudid IV and Percocet given in PACU.     Prefer to avoid Naproxen/NSAIDs with anticoagulation.  Goal of Therapy:  Anti-Xa level 0.6-1 units/ml 4hrs after LMWH dose given INR 2.5-3.5 Monitor platelets by anticoagulation protocol: Yes   Plan:   Resume Lovenox conservatively tonight with 60 mg SQ x 1 at 10pm.  Resume Warfarin with 5mg  x 1 tonight at 10pm.  Increase Lovenox to 80 mg (~1 mg/kg) SQ q12h on 7/30 am.  Daily PT/INR.  Monitor for any s/sx bleeding.  Naproxen prn changed to Acetaminophen prn for mild pain.  Arty Baumgartner, [place] Pager: [phone] 01/18/2019,7:42 PM

## 2019-01-18 NOTE — Op Note (Signed)
DATE OF OPERATION: 01/18/2019  LOCATION: [facility]  PREOPERATIVE DIAGNOSIS:  1. Breast Cancer and radiation treatment. 2.   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  POSTOPERATIVE DIAGNOSIS: Same  PROCEDURE: 1. Latissimus myocutaneous flap to reconstruct the left chest wall  SURGEON: Zeriah Baysinger Sanger Keevin Panebianco, DO  ASSISTANT: Bonita Cox, RNFA and Materials engineer, [place]  EBL: 150 cc  SPECIMEN: None  DRAINS: 3 total 19 blake round drains  CONDITION: Stable  COMPLICATIONS: None  INDICATION: The patient, Kim Anthony, is a 68 y.o. female born on 1950/10/23, is here for treatment of a left chest wall wound.  She underwent a left mastectomy and radiation many years ago.  She had breakdown of the skin and a chronic wound.  We did a resection and was fortunately not cancer.  She has not been able to heal the area.   PROCEDURE DETAILS:  The patient was seen on the morning of her surgery and marked for the location of the flap.  The IV was placed and IV antibiotics administered. The patient was taken to the operating room and placed on the operating room table.  A general anesthetic was administered.  A standard time out was performed and all information was confirmed to be correct by those in the room. Leg compression devices were placed on her legs.  The patient was placed in the right lateral decubitus position on a beanbag.  All key prominent points were padded and an axillary roll was placed in the dependent axilla. The ipsilateral arm was placed on a padded rest anteriosuperiorly.  She was then prepped and draped in the standard sterile fashion. The paddle design and position were confirmed.   The procedure began by incising the skin at the marked skin paddle on the back.  The #10 blade and bovie were used to dissect down to the latissimus muscle.  Anterior and posterior flaps were raised to expose the latissimus muscle edges.  The skin and fat flaps were  elevated to the extent necessary for release of the muscle.  The muscle was released at the superior edge of the inferior angle of the scapula.  Care was given to not lift or dissect under the serratus muscle.  The larger perforator were clipped and the smaller vessels controlled with electrocautery.  The dissection continued toward the midline and inferior toward the iliac crest.  The subscapular artery to the thoracodorsal artery was palpated in the axilla.  The branch to the serratus was ligated.  Care was taken to protect the vascular pedicle throughout this portion of the procedure. The paddle and muscle looked healthy throughout the case.  The left chest wound was cleaned and the skin edges excised.  The posterior and anterior pockets were then connected in the plane above the muscle. The muscle from the back and the skin paddle were then rotated into the chest pocket and covered with an ioban dressing. The flap and skin pedicle were inspected and there was no tension. The back pocket was hemostased and Evicel placed. Two #19 blake round drains were place in the anterior skin flap and secured with 3-0 Silk. One drain was positioned inferiorly and one superiorly.  The back incision was closed in layers with buried 3-0 Monocryl, followed by 4-0 Monocryl and 5-0 Monocryl.  Dermabond and a protective dressing was applied.   The patient was repositioned onto her back and the chest was prepped and draped. The breast pocket was inspected and hemostases was  achieved with electrocautery. The latissimus muscle was then tucked superiorly and inferiorly.  One drain was placed on this side and secured with 3-0 Silk. The flap was then closed with 3-0 Monocryl deep, followed by 4-0 Monocryl and the skin closed with 5-0 Monocryl.  Dermabond, ABDs and a breast binder was applied.  The patient was allowed to wake up and taken to recovery room in stable condition at the end of the case. The family was notified at the end of  the case.   The advanced practice practitioner (APP) assisted throughout the case.  The APP was essential in retraction and counter traction when needed to make the case progress smoothly.  This retraction and assistance made it possible to see the tissue plans for the procedure.  The assistance was needed for blood control, tissue re-approximation and assisted with closure of the incision site.

## 2019-01-18 NOTE — Interval H&P Note (Signed)
History and Physical Interval Note:  01/18/2019 12:54 PM  Kim Anthony  has presented today for surgery, with the diagnosis of HISTORY OF BREAST CANCER.  The various methods of treatment have been discussed with the patient and family. After consideration of risks, benefits and other options for treatment, the patient has consented to  Procedure(s): LEFT LATISSIMUS FLAP TO BREAST (Left) as a surgical intervention.  The patient's history has been reviewed, patient examined, no change in status, stable for surgery.  I have reviewed the patient's chart and labs.  Questions were answered to the patient's satisfaction.     Loel Lofty Dillingham

## 2019-01-18 NOTE — Telephone Encounter (Signed)
Texted by patient's daugher that the fingerstick point of care patient self-testing INR performed this morning 18-Jan-2019 is 1.2 in preparation for going to the OR later today, approximately 1330h. Have texted Dr. Marla Roe this value.

## 2019-01-18 NOTE — Transfer of Care (Signed)
Immediate Anesthesia Transfer of Care Note  Patient: [name]  Procedure(s) Performed: LEFT LATISSIMUS FLAP TO BREAST (Left )  Patient Location: PACU  Anesthesia Type:General  Level of Consciousness: awake, alert  and oriented  Airway & Oxygen Therapy: Patient Spontanous Breathing and Patient connected to face mask oxygen  Post-op Assessment: Report given to RN and Post -op Vital signs reviewed and stable  Post vital signs: Reviewed and stable  Last Vitals:  Vitals Value Taken Time  BP 142/99 01/18/19 1630  Temp    Pulse 78 01/18/19 1635  Resp 19 01/18/19 1635  SpO2 98 % 01/18/19 1635  Vitals shown include unvalidated device data.  Last Pain:  Vitals:   01/18/19 1136  TempSrc: Oral         Complications: No apparent anesthesia complications

## 2019-01-19 ENCOUNTER — Encounter (HOSPITAL_COMMUNITY): Payer: Self-pay | Admitting: Internal Medicine

## 2019-01-19 ENCOUNTER — Inpatient Hospital Stay (HOSPITAL_COMMUNITY): Payer: Medicare Other

## 2019-01-19 DIAGNOSIS — R509 Fever, unspecified: Secondary | ICD-10-CM | POA: Diagnosis present

## 2019-01-19 DIAGNOSIS — A419 Sepsis, unspecified organism: Secondary | ICD-10-CM

## 2019-01-19 DIAGNOSIS — R000 Tachycardia, unspecified: Secondary | ICD-10-CM | POA: Diagnosis present

## 2019-01-19 LAB — LACTIC ACID, PLASMA
Lactic Acid, Venous: 1.8 mmol/L (ref 0.5–1.9)
Lactic Acid, Venous: 1.6 mmol/L (ref 0.5–1.9)

## 2019-01-19 LAB — CBC
HCT: 37.1 % (ref 36.0–46.0)
HCT: 32.8 % — ABNORMAL LOW (ref 36.0–46.0)
Hemoglobin: 11.4 g/dL — ABNORMAL LOW (ref 12.0–15.0)
Hemoglobin: 10.5 g/dL — ABNORMAL LOW (ref 12.0–15.0)
MCH: 30.6 pg (ref 26.0–34.0)
MCH: 31 pg (ref 26.0–34.0)
MCHC: 30.7 g/dL (ref 30.0–36.0)
MCHC: 32 g/dL (ref 30.0–36.0)
MCV: 99.7 fL (ref 80.0–100.0)
MCV: 96.8 fL (ref 80.0–100.0)
Platelets: 435 10*3/uL — ABNORMAL HIGH (ref 150–400)
Platelets: 471 10*3/uL — ABNORMAL HIGH (ref 150–400)
RBC: 3.72 MIL/uL — ABNORMAL LOW (ref 3.87–5.11)
RBC: 3.39 MIL/uL — ABNORMAL LOW (ref 3.87–5.11)
RDW: 12.3 % (ref 11.5–15.5)
RDW: 12.4 % (ref 11.5–15.5)
WBC: 14.8 10*3/uL — ABNORMAL HIGH (ref 4.0–10.5)
WBC: 23.3 10*3/uL — ABNORMAL HIGH (ref 4.0–10.5)
nRBC: 0 % (ref 0.0–0.2)
nRBC: 0 % (ref 0.0–0.2)

## 2019-01-19 LAB — PROTIME-INR
INR: 1.1 (ref 0.8–1.2)
Prothrombin Time: 14.4 seconds (ref 11.4–15.2)

## 2019-01-19 LAB — BASIC METABOLIC PANEL
Anion gap: 10 (ref 5–15)
BUN: 6 mg/dL — ABNORMAL LOW (ref 8–23)
CO2: 25 mmol/L (ref 22–32)
Calcium: 8.9 mg/dL (ref 8.9–10.3)
Chloride: 104 mmol/L (ref 98–111)
Creatinine, Ser: 0.76 mg/dL (ref 0.44–1.00)
GFR calc Af Amer: >60 mL/min (ref >60)
GFR calc non Af Amer: >60 mL/min (ref >60)
Glucose, Bld: 115 mg/dL — ABNORMAL HIGH (ref 70–99)
Potassium: 3.9 mmol/L (ref 3.5–5.1)
Sodium: 139 mmol/L (ref 135–145)

## 2019-01-19 LAB — TROPONIN I (HIGH SENSITIVITY): Troponin I (High Sensitivity): 36 ng/L — ABNORMAL HIGH (ref <18)

## 2019-01-19 LAB — HIV ANTIBODY (ROUTINE TESTING W REFLEX): HIV Screen 4th Generation wRfx: NONREACTIVE

## 2019-01-19 LAB — D-DIMER, QUANTITATIVE: D-Dimer, Quant: 2.34 ug/mL-FEU — ABNORMAL HIGH (ref 0.00–0.50)

## 2019-01-19 LAB — SEDIMENTATION RATE: Sed Rate: 63 mm/hr — ABNORMAL HIGH (ref 0–22)

## 2019-01-19 MED ORDER — VANCOMYCIN HCL 10 G IV SOLR
1500.0000 mg | INTRAVENOUS | Status: DC
  Administered 2019-01-19 – 2019-01-23 (×4): 1500 mg via INTRAVENOUS
  Filled 2019-01-19 (×4): qty 1500

## 2019-01-19 MED ORDER — WARFARIN SODIUM 5 MG PO TABS
10.0000 mg | ORAL_TABLET | Freq: Once | ORAL | Status: AC
  Administered 2019-01-19: 10 mg via ORAL
  Filled 2019-01-19: qty 2

## 2019-01-19 MED ORDER — SODIUM CHLORIDE 0.9 % IV SOLN
INTRAVENOUS | Status: AC
  Administered 2019-01-19 (×2): via INTRAVENOUS

## 2019-01-19 MED ORDER — SODIUM CHLORIDE 0.9 % IV BOLUS
1000.0000 mL | Freq: Once | INTRAVENOUS | Status: AC
  Administered 2019-01-19: 1000 mL via INTRAVENOUS

## 2019-01-19 MED ORDER — SULFAMETHOXAZOLE-TRIMETHOPRIM 400-80 MG/5ML IV SOLN
15.0000 mg/kg/d | Freq: Four times a day (QID) | INTRAVENOUS | Status: DC
  Administered 2019-01-19: 20:00:00 291.04 mg via INTRAVENOUS
  Filled 2019-01-19 (×5): qty 18.19

## 2019-01-19 MED ORDER — HYDROMORPHONE HCL 1 MG/ML IJ SOLN
1.0000 mg | INTRAMUSCULAR | Status: DC | PRN
  Administered 2019-01-19 – 2019-01-31 (×36): 1 mg via INTRAVENOUS
  Filled 2019-01-19 (×37): qty 1

## 2019-01-19 MED ORDER — SODIUM CHLORIDE 0.9 % IV SOLN
2.0000 g | Freq: Three times a day (TID) | INTRAVENOUS | Status: DC
  Administered 2019-01-19 – 2019-01-27 (×23): 2 g via INTRAVENOUS
  Filled 2019-01-19 (×26): qty 2

## 2019-01-19 NOTE — Progress Notes (Signed)
ANTICOAGULATION CONSULT NOTE - Follow Up Consult  Pharmacy Consult for Coumadin Indication:  mech MV and chronic PE  Allergies  Allergen Reactions  . Acetaminophen-Codeine Itching  . Propoxyphene Itching    Darvocet  . Tramadol Itching and Nausea And Vomiting    Patient Measurements: Height: 5\' 5"  (165.1 cm) Weight: 171 lb (77.6 kg) IBW/kg (Calculated) : 57  Vital Signs: Temp: 99 F (37.2 C) (07/30 0607) Temp Source: Oral (07/30 0607) BP: 104/65 (07/30 0607) Pulse Rate: 98 (07/30 0607)  Labs: Recent Labs    01/19/19 0155  HGB 11.4*  HCT 37.1  PLT 435*  LABPROT 14.4  INR 1.1  CREATININE 0.76    Estimated Creatinine Clearance: 68.3 mL/min (by C-G formula based on SCr of 0.76 mg/dL).   Assessment: Anticoag: Lovenox and Warfarin for hx mech MV and chronic PE.  Era Skeen manages [facility]. Has been off Warfarin, on Enox 120 mg sq q24hrs. INR 1.1. Hgb 11.4 down with post-op anemia.  -  PTA Warfarin: 5 mg daily except 7.5 mg on Wed and Sun. Last dose 7/23. INR was 3 that day. Has home INR monitor.  Lovenox 120 mg sq q24hrs begun 7/24>>7/27, then 60 mg on 7/28 at 8am  Goal of Therapy:  INR 2.5-3.5 Monitor platelets by anticoagulation protocol: Yes   Plan:  Lovenox 80mg /12h  Warfarin 10 mg x 1 tonight.  Daily PT/INR, goal 2.5-3.5  Ota Ebersole S. Alford Highland, PharmD, BCPS Clinical Staff Pharmacist Eilene Ghazi Stillinger 01/19/2019,9:54 AM

## 2019-01-19 NOTE — Progress Notes (Addendum)
1 Day Post-Op  Subjective: [name] is sitting up in bed this morning on exam, she reports this is the best position for her to help minimize pain. She is in a lot of pain and reports that her current level of pain is a 9 out of 10.  She reports that her pain is currently minimally controlled with morphine, percocet; still a 7/10 after dosing.  She did not get much sleep last night due to the pain.   She has been doing well other than the pain.  She has been passing flatus and has had multiple bowel movements.  She is urinating normally.  She has been drinking plenty of fluids including water and ginger ale.  Following Pharmacy recommendations for dosing lovenox and coumadin.  Her current INR is 1.1 with a target of 2.5-3.5.   Current white blood cell count 14.8.  Platelets at 435. Hgb 11.4  She feels as if she would prefer to stay 1 more day/night due to the pain being so significant and might need different pain control medication.  Her drains have been emptied multiple times since surgery, mostly sanguinous fluid per nursing. They are trending down.    Objective: Vital signs in last 24 hours: Temp:  [97.6 F (36.4 C)-99 F (37.2 C)] 99 F (37.2 C) (07/30 0607) Pulse Rate:  [76-98] 98 (07/30 0607) Resp:  [15-22] 20 (07/30 0607) BP: (104-158)/(65-115) 104/65 (07/30 0607) SpO2:  [88 %-100 %] 97 % (07/30 0621) Weight:  [77.6 kg] 77.6 kg (07/29 1900) Last BM Date: 01/18/19  Intake/Output from previous day: 07/29 0701 - 07/30 0700 In: 1019.6 [I.V.:896.8; IV Piggyback:122.8] Out: 635 [Drains:485; Blood:150] Intake/Output this shift: Total I/O In: 919.6 [I.V.:796.8; IV Piggyback:122.8] Out: 375 [Drains:375]  Physical Exam  Constitutional: She is oriented to person, place, and time and well-developed, well-nourished, and in no distress. She appears to be writhing in pain.  Non-toxic appearance.  HENT:  Head: Normocephalic and atraumatic.  Eyes: Pupils are equal, round, and  reactive to light.  Neck: Normal range of motion. Neck supple.  Cardiovascular: Normal rate.  Murmur heard. Pulmonary/Chest: Effort normal. No respiratory distress. She has wheezes (end expiratory). She has no rales.  Abdominal: Soft. Bowel sounds are normal. She exhibits no distension. There is no abdominal tenderness.  Musculoskeletal: Normal range of motion.        General: No edema.  Lymphadenopathy:    She has no cervical adenopathy.  Neurological: She is alert and oriented to person, place, and time.  Skin: Skin is warm and dry. She is not diaphoretic.  Left chest wall incisions approximated.  No drainage noted.  No dehiscence noted.  Dermabond in place.  Her left chest wall is very tight.   Left back incision covered with bandage did not remove.  No sign of bleeding or soiling of bandage.    Lab Results:  CBC Latest Ref Rng & Units 01/19/2019 01/12/2019 01/07/2019  WBC 4.0 - 10.5 K/uL 14.8(H) 8.7 9.1  Hemoglobin 12.0 - 15.0 g/dL 11.4(L) 12.3 11.6(L)  Hematocrit 36.0 - 46.0 % 37.1 40.3 36.9  Platelets 150 - 400 K/uL 435(H) 614(H) 426(H)    BMET Recent Labs    01/19/19 0155  NA 139  K 3.9  CL 104  CO2 25  GLUCOSE 115*  BUN 6*  CREATININE 0.76  CALCIUM 8.9   PT/INR Recent Labs    01/19/19 0155  LABPROT 14.4  INR 1.1   ABG No results for input(s): PHART, HCO3 in the  last 72 hours.  Invalid input(s): PCO2, PO2  Studies/Results: No results found.  Anti-infectives: Anti-infectives (From admission, onward)   Start     Dose/Rate Route Frequency Ordered Stop   01/18/19 2200  ceFAZolin (ANCEF) IVPB 2g/100 mL premix     2 g 200 mL/hr over 30 Minutes Intravenous Every 8 hours 01/18/19 1804     01/18/19 1155  ceFAZolin (ANCEF) 2-4 GM/100ML-% IVPB    Note to Pharmacy: Merryl Hacker   : cabinet override      01/18/19 1155 01/18/19 2359      Assessment/Plan: s/p Procedure(s): LEFT LATISSIMUS FLAP TO BREAST.  [name] will stay overnight due to difficulty  with pain control.  She has multiple medications on her order to assist with pain management.  Appreciate pharmacy's help with medication management for pain due to allergies.  [name] understands that it is important for her to use her incentive spirometer.  She reported on exam that she had not been using it and she was counseled to use it at least once every hour or more frequent.  Ideally she should use it during each commercial if she is watching TV.    Appreciate pharmacy's assistance with her Lovenox and Coumadin dosing.  Continue INR daily.  Goal INR 2.5-3.5 due to mechanical mitral valve.  continue to eat a normal heart healthy diet, drink plenty of fluids.  Avoid sugary drinks and carbs.  Continue antibiotic treatment to prevent postop infection.  We will plan to discharge [name] tomorrow.  I will follow-up with her in the morning and later this afternoon to check in.  Will determine final plan based on her level of pain tomorrow.    LOS: 1 day    Charlies Constable, PA-C 01/19/2019

## 2019-01-19 NOTE — Significant Event (Signed)
Rapid Response Event Note  Overview: Time Called: 8676 Arrival Time: 1825 Event Type: MEWS(Sepsis)  Initial Focused Assessment: Patient sitting up in chair in no distress.  She does endorse tenderness of left shoulder.  Left chest incision with dressing clean dry intact, JP drains charged (serosang drainage).  Her left shoulder is swollen and hot to the touch.  She has a hard area anterior area of shoulder.  Left arm with ace bandage wrap BP 121/75  HR 113  RR 28 O2 sat 97% on 2L oral Temp 103 She has just received 2 tabs vicoden 5/325 about 5pm. Per patient and staff her pain is better controled than this am. Lung sounds clear  Interventions: Labs drawn (CBC, Lactic Acid) 1L NS bolus Additional abx ordered PA consulting medicine Assisted patient to bed, ambulated easily  Transfer to progressive   Plan of Care (if not transferred):  Event Summary: Name of Physician Notified: Scheeler, Carola Rhine, PA-C at 1800  Name of Consulting Physician Notified: [facility] at 1900  Outcome: Transferred (Comment)  Event End Time: 1915  Hand off with Night RN  Raliegh Ip

## 2019-01-19 NOTE — Care Management (Signed)
Prior to admission patient from home with [facility] for HHRN,PT,OT will need new orders with wound care instructions.   Thanks   [employer] RN [phone]

## 2019-01-19 NOTE — Anesthesia Postprocedure Evaluation (Signed)
Anesthesia Post Note  Patient: Denver Harder  Procedure(s) Performed: LEFT LATISSIMUS FLAP TO BREAST (Left )     Patient location during evaluation: PACU Anesthesia Type: General Level of consciousness: awake and alert Pain management: pain level controlled Vital Signs Assessment: post-procedure vital signs reviewed and stable Respiratory status: spontaneous breathing, nonlabored ventilation, respiratory function stable and patient connected to nasal cannula oxygen Cardiovascular status: blood pressure returned to baseline and stable Postop Assessment: no apparent nausea or vomiting Anesthetic complications: no    Last Vitals:  Vitals:   01/18/19 1805 01/18/19 2130  BP: (!) 146/89 136/76  Pulse: 81 93  Resp: 18 20  Temp: 36.7 C 37.1 C  SpO2: 94% 100%    Last Pain:  Vitals:   01/19/19 0535  TempSrc:   PainSc: 8                  Brenleigh Collet DAVID

## 2019-01-19 NOTE — H&P (Addendum)
TRH H&P    Patient Demographics:    Kim Anthony, is a 68 y.o. female  [name]: [id number]  DOB - 04-05-1951  Admit Date - 01/18/2019  Referring MD/NP/PA:  Audelia Hives  Outpatient Primary MD for the patient is Marianna Payment, MD  Patient coming from:   Plastic surgery   Chief complaint-  Fever, tachycardia   HPI:    Kim Anthony  is a 68 y.o. female,  w hypertension, hyperlipidemia, CHF (EF 50-55%), h/o MVR (mechanical), h/o PE , Copd, h/o L breast cancer s/p XRT w chronic wound  who presented on 01/19/2019 for Latissimus myocutaneous flap to reconstruct the left chest wall and apparently developed fever of 103, hr 113, Bp 121/75 pox 88%,  bp as low as 95/51,   Pt transferred to 4N by plastic surgery and medicine consulted to take over her care.   Pt notes cough with yellow sputum  Pt denies cp, palp, n/v, abd pain, diarrhea, brbpr, black stool, dysuria, hematuria.      Review of systems:    In addition to the HPI above,    No Headache, No changes with Vision or hearing, No problems swallowing food or Liquids, No Chest pain,  No Abdominal pain, No Nausea or Vomiting, bowel movements are regular, No Blood in stool or Urine, No dysuria, No new skin rashes or bruises, No new joints pains-aches,  No new weakness, tingling, numbness in any extremity, No recent weight gain or loss, No polyuria, polydypsia or polyphagia, No significant Mental Stressors.  All other systems reviewed and are negative.    Past History of the following :    Past Medical History:  Diagnosis Date  . Arthritis   . Asthma   . Breast cancer ([place])    Left Breast Cancer  . CHF (congestive heart failure) ([place])   . COPD (chronic obstructive pulmonary disease) ([place])   . Coronary artery disease   . History of mitral valve replacement with mechanical valve   . HLD (hyperlipidemia)   . Hypertension   . Pulmonary embolism  [facility])       Past Surgical History:  Procedure Laterality Date  . COLONOSCOPY WITH PROPOFOL N/A 06/07/2018   Procedure: COLONOSCOPY WITH PROPOFOL;  Surgeon: Wilford Corner, MD;  Location: [facility];  Service: Endoscopy;  Laterality: N/A;  . DEBRIDEMENT AND CLOSURE WOUND Left 12/28/2018   Procedure: Debridement And Closure Wound;  Surgeon: Coralie Keens, MD;  Location: [place];  Service: General;  Laterality: Left;  [name] MASTECTOMY Left 2000s   for breast cancer, also s/p radiation and chemo  . MITRAL VALVE REPLACEMENT     mechanical  . POLYPECTOMY  06/07/2018   Procedure: POLYPECTOMY;  Surgeon: Wilford Corner, MD;  Location: [facility];  Service: Endoscopy;;  . WOUND DEBRIDEMENT Left 12/28/2018   Procedure: EXCISION OF LEFT CHRONIC CHEST WALL WOUND;  Surgeon: Coralie Keens, MD;  Location: [facility] [place];  Service: General;  Laterality: Left;      Social History:      Social History   Tobacco Use  .  Smoking status: Current Every Day Smoker    Packs/day: 0.25    Years: 30.00    Pack years: 7.50    Types: Cigarettes  . Smokeless tobacco: Never Used  . Tobacco comment: Sometimes less.  Substance Use Topics  . Alcohol use: Yes    Comment: beer- ocasional       Family History :     Family History  Problem Relation Age of Onset  . Heart attack Other   . Breast cancer Maternal Aunt 50       Home Medications:   Prior to Admission medications   Medication Sig Start Date End Date Taking? Authorizing Provider  albuterol (PROVENTIL) (2.5 MG/3ML) 0.083% nebulizer solution Take 3 mLs (2.5 mg total) by nebulization every 6 (six) hours as needed for wheezing or shortness of breath. 11/26/17  Yes Katherine Roan, MD  albuterol (VENTOLIN HFA) 108 (90 Base) MCG/ACT inhaler Inhale 1-2 puffs into the lungs every 6 (six) hours as needed for wheezing or shortness of breath. 12/26/18  Yes Sid Falcon, MD  ANORO ELLIPTA 62.5-25 MCG/INH AEPB Inhale 1 puff by mouth once daily Patient  taking differently: Inhale 1 puff into the lungs daily.  08/25/18  Yes Isabelle Course, MD  Calcium-Phosphorus-Vitamin D (CITRACAL +D3 PO) Take 1 tablet by mouth 2 (two) times a day.   Yes [provider]  diazepam (VALIUM) 5 MG tablet Take 1 tablet (5 mg total) by mouth every 12 (twelve) hours as needed for muscle spasms. 01/03/19  Yes Dillingham, Loel Lofty, DO  fluticasone (FLONASE) 50 MCG/ACT nasal spray Place 2 sprays into both nostrils daily. Patient taking differently: Place 1 spray into both nostrils daily as needed for allergies.  12/03/16 04/11/26 Yes Jule Ser, DO  furosemide (LASIX) 40 MG tablet Take 1 tablet (40 mg total) by mouth daily. 11/30/18  Yes Isabelle Course, MD  gabapentin (NEURONTIN) 600 MG tablet Take 1 tablet (600 mg total) by mouth 2 (two) times daily. 09/29/18  Yes Isabelle Course, MD  HYDROcodone-acetaminophen (NORCO) 5-325 MG tablet Take 1 tablet by mouth every 4 (four) hours as needed for moderate pain. 01/07/19  Yes Daleen Bo, MD  lisinopril (ZESTRIL) 10 MG tablet Take 1 tablet (10 mg total) by mouth at bedtime. 10/31/18  Yes Isabelle Course, MD  metoprolol succinate (TOPROL-XL) 25 MG 24 hr tablet TAKE 1/2 (ONE-HALF) TABLET BY MOUTH ONCE DAILY Patient taking differently: Take 12.5 mg by mouth at bedtime.  11/30/18  Yes Isabelle Course, MD  potassium chloride SA (K-DUR) 20 MEQ tablet Take 2 tablets (40 mEq total) by mouth daily. 12/26/18  Yes Sid Falcon, MD  simvastatin (ZOCOR) 20 MG tablet Take 1 tablet by mouth once daily Patient taking differently: Take 20 mg by mouth daily.  10/10/18  Yes Annia Belt, MD  warfarin (COUMADIN) 5 MG tablet Take 1 tablet (5 mg total) by mouth daily. Patient taking differently: Take 5-7.5 mg by mouth See admin instructions. Take 7.5 mg on Sunday and Wednesday Tale 5 mg all the other days 07/14/18  Yes Forde Dandy, PharmD  enoxaparin (LOVENOX) 120 MG/0.8ML injection Inject 120 mg into the skin daily. Took 60 units.     [provider]  enoxaparin (LOVENOX) 80 MG/0.8ML injection Inject 0.8 mLs (80 mg total) into the skin every 12 (twelve) hours for 10 doses. Patient not taking: Reported on 01/12/2019 12/24/18 12/29/18  Pennie Banter, RPH-CPP     Allergies:  Allergies  Allergen Reactions  . Acetaminophen-Codeine Itching  . Propoxyphene Itching    Darvocet  . Tramadol Itching and Nausea And Vomiting     Physical Exam:   Vitals  Blood pressure (!) 95/51, pulse 97, temperature 100.2 F (37.9 C), temperature source Oral, resp. rate 20, height '5\' 5"'  (1.651 m), weight 77.6 kg, SpO2 100 %.  1.  General: axoxo3  2. Psychiatric: euthymic  3. Neurologic: cn2-12 intact, reflexes 2+ symmetric, diffuse with no clonus, motor 5/5 in all 4 ext,   4. HEENMT:  Anicteric, pupils 1.68m symmetric, direct, consensual, near intact Neck: no jvd, no bruit  5. Respiratory : + crackles right > left base, no wheezing  6. Cardiovascular : rrr s1, s2, + click  7. Gastrointestinal:  Abd: soft, nt, nd, +bs  8. Skin:  Ext: no c/c/e,  There is slight swelling of the left upper arm , redness as well  9.Musculoskeletal:  Good ROM,  No adenopathy    Data Review:    CBC Recent Labs  Lab 01/19/19 0155 01/19/19 1909  WBC 14.8* 23.3*  HGB 11.4* 10.5*  HCT 37.1 32.8*  PLT 435* 471*  MCV 99.7 96.8  MCH 30.6 31.0  MCHC 30.7 32.0  RDW 12.3 12.4   ------------------------------------------------------------------------------------------------------------------  Results for orders placed or performed during the hospital encounter of 01/18/19 (from the past 48 hour(s))  HIV antibody (Routine Testing)     Status: None   Collection Time: 01/19/19  1:55 AM  Result Value Ref Range   HIV Screen 4th Generation wRfx Non Reactive Non Reactive    Comment: (NOTE) Performed At: [id number] Farmer[place] PCN:[id number]  Basic metabolic panel      Status: Abnormal   Collection Time: 01/19/19  1:55 AM  Result Value Ref Range   Sodium 139 135 - 145 mmol/L   Potassium 3.9 3.5 - 5.1 mmol/L   Chloride 104 98 - 111 mmol/L   CO2 25 22 - 32 mmol/L   Glucose, Bld 115 (H) 70 - 99 mg/dL   BUN 6 (L) 8 - 23 mg/dL   Creatinine, Ser 0.76 0.44 - 1.00 mg/dL   Calcium 8.9 8.9 - 10.3 mg/dL   GFR calc non Af Amer >60 >60 mL/min   GFR calc Af Amer >60 >60 mL/min   Anion gap 10 5 - 15    Comment: Performed at [facility] [address], [id number] CBC     Status: Abnormal   Collection Time: 01/19/19  1:55 AM  Result Value Ref Range   WBC 14.8 (H) 4.0 - 10.5 K/uL   RBC 3.72 (L) 3.87 - 5.11 MIL/uL   Hemoglobin 11.4 (L) 12.0 - 15.0 g/dL   HCT 37.1 36.0 - 46.0 %   MCV 99.7 80.0 - 100.0 fL   MCH 30.6 26.0 - 34.0 pg   MCHC 30.7 30.0 - 36.0 g/dL   RDW 12.3 11.5 - 15.5 %   Platelets 435 (H) 150 - 400 K/uL   nRBC 0.0 0.0 - 0.2 %    Comment: Performed at [facility] [address] [place] [id number] Protime-INR     Status: None   Collection Time: 01/19/19  1:55 AM  Result Value Ref Range   Prothrombin Time 14.4 11.4 - 15.2 seconds   INR 1.1 0.8 - 1.2    Comment: (NOTE) INR goal varies based on device and disease  states. Performed at [facility], [place] [address] [place], [place] [address]   CBC     Status: Abnormal   Collection Time: 01/19/19  7:09 PM  Result Value Ref Range   WBC 23.3 (H) 4.0 - 10.5 K/uL   RBC 3.39 (L) 3.87 - 5.11 MIL/uL   Hemoglobin 10.5 (L) 12.0 - 15.0 g/dL   HCT 32.8 (L) 36.0 - 46.0 %   MCV 96.8 80.0 - 100.0 fL   MCH 31.0 26.0 - 34.0 pg   MCHC 32.0 30.0 - 36.0 g/dL   RDW 12.4 11.5 - 15.5 %   Platelets 471 (H) 150 - 400 K/uL   nRBC 0.0 0.0 - 0.2 %    Comment: Performed at [facility] [address], [place], [place] [address]  Lactic acid, plasma     Status: None   Collection Time: 01/19/19  7:09 PM  Result Value Ref Range   Lactic Acid, Venous 1.8 0.5 - 1.9  mmol/L    Comment: Performed at [facility] [address], [place], [place] [address]    Chemistries  Recent Labs  Lab 01/19/19 0155  NA 139  K 3.9  CL 104  CO2 25  GLUCOSE 115*  BUN 6*  CREATININE 0.76  CALCIUM 8.9   ------------------------------------------------------------------------------------------------------------------  ------------------------------------------------------------------------------------------------------------------ GFR: Estimated Creatinine Clearance: 68.3 mL/min (by C-G formula based on SCr of 0.76 mg/dL). Liver Function Tests: No results for input(s): AST, ALT, ALKPHOS, BILITOT, PROT, ALBUMIN in the last 168 hours. No results for input(s): LIPASE, AMYLASE in the last 168 hours. No results for input(s): AMMONIA in the last 168 hours. Coagulation Profile: Recent Labs  Lab 01/19/19 0155  INR 1.1   Cardiac Enzymes: No results for input(s): CKTOTAL, CKMB, CKMBINDEX, TROPONINI in the last 168 hours. BNP (last 3 results) No results for input(s): PROBNP in the last 8760 hours. HbA1C: No results for input(s): HGBA1C in the last 72 hours. CBG: No results for input(s): GLUCAP in the last 168 hours. Lipid Profile: No results for input(s): CHOL, HDL, LDLCALC, TRIG, CHOLHDL, LDLDIRECT in the last 72 hours. Thyroid Function Tests: No results for input(s): TSH, T4TOTAL, FREET4, T3FREE, THYROIDAB in the last 72 hours. Anemia Panel: No results for input(s): VITAMINB12, FOLATE, FERRITIN, TIBC, IRON, RETICCTPCT in the last 72 hours.  --------------------------------------------------------------------------------------------------------------- Urine analysis: No results found for: COLORURINE, APPEARANCEUR, LABSPEC, PHURINE, GLUCOSEU, HGBUR, BILIRUBINUR, KETONESUR, PROTEINUR, UROBILINOGEN, NITRITE, LEUKOCYTESUR    Imaging Results:    No results found.      Assessment & Plan:    Principal Problem:   Fever Active Problems:   Open wound  of chest wall, left, initial encounter   Sepsis (HCC)   Tachycardia  Fever ? Cellulitis (left upper chest/ left upper arm) Blood culture x2 CXR Urinalysis Check ESR Check cardiac echo   Sepsis (fever, tachycardia, hypotension) Blood culture x2, CXR, urinalysis as above Check lactic acid DC ancef vanco iv, cefepime iv pharmacy to dose   Tachycardia D dimer, if positive then CTA chest Check cardiac echo Cont Lovenox 34m/kg [place] bid Cont Coumadin pharmacy to dose  LUE swelling Check u/s r/o DVT  Chronic Left chest wound s/p Latissimus myocutaneous flap to reconstruct the left chest wall  Plastic will follow, appreciate input  CAD, CHF (EF 55%) Cont Toprol XL 247m1/2 po qday Cont Lisinopril 1079mo qday Cont Zocor 21m43m qhs Cont Lasix 40mg32mqday Cont Kcl 21meq78mqday  H/o MVR (mechanical) Cont anticoagulation as  above  Anxiety Cont Valium prn   COPD Cont Albuterol prn   ? Chronic pain Cont Gabapentin  DVT Prophylaxis-   Lovenox - SCDs   AM Labs Ordered, also please review Full Orders  Family Communication: Admission, patients condition and plan of care including tests being ordered have been discussed with the patient  who indicate understanding and agree with the plan and Code Status.  Code Status:  FULL CODE  Admission status:  Inpatient: Based on patients clinical presentation and evaluation of above clinical data, I have made determination that patient meets Inpatient criteria at this time.  Pt will require iv abx, for sepsis, and has high risk of clinical decline,  Pt will require > 2 nites stay.   Time spent in minutes : 70   Jani Gravel M.D on 01/19/2019 at 8:38 PM

## 2019-01-19 NOTE — Progress Notes (Signed)
1 Day Post-Op  Subjective: Saw Kim Anthony this afternoon, she is resting in her reclining chair on evaluation. She appears to be feeling a lot better in regards to her pain.   She reports that she overall feels a lot better and has no complaints other than left chest/arm tightness.  Nursing reported to notice erythema and swelling of her left shoulder/lateral chest. She does not have any subjective fevers or chills. Measured temperature of 100.1 F at 2pm. Nursing to closely monitor.  Spoke with Kim Anthony daughter, Kim Anthony about her left arm compression sleeve.  After her last surgery she had some swelling of her left shoulder arm as well.  Per the daughter it was not erythematous like it appears to me this time but it was swollen.  She is currently on Ancef every 8 hours.  Objective: Vital signs in last 24 hours: Temp:  [97.6 F (36.4 C)-100.1 F (37.8 C)] 100.1 F (37.8 C) (07/30 1419) Pulse Rate:  [76-106] 90 (07/30 1419) Resp:  [15-22] 16 (07/30 1419) BP: (101-153)/(64-115) 101/69 (07/30 1419) SpO2:  [88 %-100 %] 100 % (07/30 1419) Weight:  [77.6 kg] 77.6 kg (07/29 1900) Last BM Date: 01/18/19  Intake/Output from previous day: 07/29 0701 - 07/30 0700 In: 1019.6 [I.V.:896.8; IV Piggyback:122.8] Out: 635 [Drains:485; Blood:150] Intake/Output this shift: Total I/O In: 1536.6 [P.O.:480; I.V.:879.6; IV Piggyback:177] Out: 155 [Drains:155]  General appearance: alert, cooperative, no distress and resting in chair  Head: Normocephalic, without obvious abnormality, atraumatic Back: negative, left back incision. No dehiscence noted. Resp: wheezes bilaterally Chest wall: no tenderness, left sided chest wall tenderness Breasts: Left breast latissimus flap incisions with minimal sanguinous drainage noted on ABD pad.  Erythematous flap and surrounding area.  There is good capillary refill.  Drain in place. Cardio: Regular rate and rhythm.  Murmur noted. Extremities: extremities normal,  atraumatic, no cyanosis or edema and No swelling noted.  No erythema.  Pulses intact. Pulses: 2+ and symmetric Dorsalis pedis pulses intact 2+ Skin: Erythema noted along her left shoulder and left chest wall extending towards midline. Neurologic: Grossly normal Incision/Wound:  left back latissimus incision is healing well, no drainage or dehiscence noted.  Left chest latissimus flap incision healing well, no dehiscence noted, some sanguinous drainage noted. left shoulder with some erythema, hard to the touch with slight warmth.  Lab Results:  CBC    Component Value Date/Time   WBC 14.8 (H) 01/19/2019 0155   RBC 3.72 (L) 01/19/2019 0155   HGB 11.4 (L) 01/19/2019 0155   HGB 12.5 04/21/2018 1624   HCT 37.1 01/19/2019 0155   HCT 36.0 04/21/2018 1624   PLT 435 (H) 01/19/2019 0155   PLT 403 04/21/2018 1624   MCV 99.7 01/19/2019 0155   MCV 94 04/21/2018 1624   MCH 30.6 01/19/2019 0155   MCHC 30.7 01/19/2019 0155   RDW 12.3 01/19/2019 0155   RDW 11.4 (L) 04/21/2018 1624   LYMPHSABS 2.3 01/12/2019 0948   LYMPHSABS 3.0 04/21/2018 1624   MONOABS 0.5 01/12/2019 0948   EOSABS 0.3 01/12/2019 0948   EOSABS 0.2 04/21/2018 1624   BASOSABS 0.1 01/12/2019 0948   BASOSABS 0.1 04/21/2018 1624    BMET Recent Labs    01/19/19 0155  NA 139  K 3.9  CL 104  CO2 25  GLUCOSE 115*  BUN 6*  CREATININE 0.76  CALCIUM 8.9   PT/INR Recent Labs    01/19/19 0155  LABPROT 14.4  INR 1.1   ABG No results for input(s): PHART,  HCO3 in the last 72 hours.  Invalid input(s): PCO2, PO2  Studies/Results: No results found.  Anti-infectives: Anti-infectives (From admission, onward)   Start     Dose/Rate Route Frequency Ordered Stop   01/18/19 2200  ceFAZolin (ANCEF) IVPB 2g/100 mL premix     2 g 200 mL/hr over 30 Minutes Intravenous Every 8 hours 01/18/19 1804     01/18/19 1155  ceFAZolin (ANCEF) 2-4 GM/100ML-% IVPB    Note to Pharmacy: Merryl Hacker   : cabinet override      01/18/19 1155  01/18/19 2359     Antibiotics Given (last 72 hours)    Date/Time Action Medication Dose Rate   01/18/19 2123 New Bag/Given   ceFAZolin (ANCEF) IVPB 2g/100 mL premix 2 g 200 mL/hr   01/19/19 7829 New Bag/Given   ceFAZolin (ANCEF) IVPB 2g/100 mL premix 2 g 200 mL/hr   01/19/19 1401 New Bag/Given   ceFAZolin (ANCEF) IVPB 2g/100 mL premix 2 g 200 mL/hr      Assessment/Plan: s/p Procedure(s): LEFT LATISSIMUS FLAP TO BREAST  Saw Mrs. Rising this morning.  Continue with current plan stated on previous note this morning.  In addition: Referral sent to PT for ambulation and fitting for compression sleeve.  She has had a history of left arm swelling postoperatively.  Will wrapped with Ace bandage until PT consult.   DVT ppx with SEDs.  Mrs. Wheelwright seems to be a little bit forgetful upon evaluation.  I am unsure if she is normally this forgetful or if she is having some forgetfulness due to the pain meds and anesthesia.  I reminded her to continue to use the incentive spirometer to prevent atelectasis/PNA. She is a daily smoker so this is important and she understands this.  Nursing reports they will assist with trying to remind her to use her incentive spirometer.  In regards to the left arm/shoulder swelling and erythema of her left chest wall, continue with Ancef infusions every 8 hours.  It is unlikely for her to have an infection after surgery, but prior to surgery she did have wound dehiscence and breakdown of her left chest wall wound so in this scenario it could be possible.  Continue to monitor for fever.  Check CBC in a.m.  Current white blood cell count is 14.8.  Her pain is controlled at this time, Dilaudid has helped and she feels significantly better.    LOS: 1 day    Charlies Constable, PA-C 01/19/2019

## 2019-01-19 NOTE — Progress Notes (Signed)
Pharmacy Antibiotic Note  Kim Anthony is a 68 y.o. female admitted on 01/18/2019 for left latissimus flap to breast for chronic wound after excision of an areas of left breast / chest. Hx breast cancer, complete axillary node dissection and radiation.  POD#1.   Pharmacy has been consulted for Cefepime and Vancomycin dosing for sepsis coverage. Received Cefazolin pre-op and post-op q8h x 3 doses completed at 2pm today.  Septra IV x 1 given tonight.  Tmax 103, WBC up to 23.3. Blood cultures ordered.  Plan:  Cefepime 2gm IV q8hrs.  Vancomycin 1500 mg IV Q 24 hrs.   Goal AUC 400-550.  Expected AUC: 497  SCr used: 0.8  Will follow renal function, culture data, and clinical progress.  Vancomcyin levels at steady state if Vanc to continue > 5 days.   Height: 5\' 5"  (165.1 cm) Weight: 171 lb (77.6 kg) IBW/kg (Calculated) : 57  Temp (24hrs), Avg:100.8 F (38.2 C), Min:98.6 F (37 C), Max:103 F (39.4 C)  Recent Labs  Lab 01/19/19 0155 01/19/19 1909  WBC 14.8* 23.3*  CREATININE 0.76  --   LATICACIDVEN  --  1.8    Estimated Creatinine Clearance: 68.3 mL/min (by C-G formula based on SCr of 0.76 mg/dL).    Allergies  Allergen Reactions  . Acetaminophen-Codeine Itching  . Propoxyphene Itching    Darvocet  . Tramadol Itching and Nausea And Vomiting    Antimicrobials this admission:  Cefazolin 7/29>>7/30   Septra IV x 1 on 7/30 at 8pm   Cefepime 7/30>>  Vancomycin 7/30>>  Dose adjustments this admission:  n/a  Microbiology results:  7/30 blood x 2: sent  Thank you for allowing pharmacy to be a part of this patient's care.  Arty Baumgartner, [place] Pager: [phone] or phone: [phone] 01/19/2019 9:28 PM

## 2019-01-20 ENCOUNTER — Inpatient Hospital Stay (HOSPITAL_COMMUNITY): Payer: Medicare Other

## 2019-01-20 ENCOUNTER — Encounter (HOSPITAL_COMMUNITY): Payer: Self-pay | Admitting: Plastic Surgery

## 2019-01-20 DIAGNOSIS — R5082 Postprocedural fever: Secondary | ICD-10-CM

## 2019-01-20 DIAGNOSIS — I361 Nonrheumatic tricuspid (valve) insufficiency: Secondary | ICD-10-CM

## 2019-01-20 LAB — CBC WITH DIFFERENTIAL/PLATELET
Abs Immature Granulocytes: 0.2 10*3/uL — ABNORMAL HIGH (ref 0.00–0.07)
Basophils Absolute: 0.1 10*3/uL (ref 0.0–0.1)
Basophils Relative: 0 %
Eosinophils Absolute: 0.1 10*3/uL (ref 0.0–0.5)
Eosinophils Relative: 0 %
HCT: 28.9 % — ABNORMAL LOW (ref 36.0–46.0)
Hemoglobin: 9.1 g/dL — ABNORMAL LOW (ref 12.0–15.0)
Immature Granulocytes: 1 %
Lymphocytes Relative: 16 %
Lymphs Abs: 3.9 10*3/uL (ref 0.7–4.0)
MCH: 31.5 pg (ref 26.0–34.0)
MCHC: 31.5 g/dL (ref 30.0–36.0)
MCV: 100 fL (ref 80.0–100.0)
Monocytes Absolute: 2.1 10*3/uL — ABNORMAL HIGH (ref 0.1–1.0)
Monocytes Relative: 9 %
Neutro Abs: 18 10*3/uL — ABNORMAL HIGH (ref 1.7–7.7)
Neutrophils Relative %: 74 %
Platelets: 421 10*3/uL — ABNORMAL HIGH (ref 150–400)
RBC: 2.89 MIL/uL — ABNORMAL LOW (ref 3.87–5.11)
RDW: 12.5 % (ref 11.5–15.5)
WBC: 24.4 10*3/uL — ABNORMAL HIGH (ref 4.0–10.5)
nRBC: 0 % (ref 0.0–0.2)

## 2019-01-20 LAB — URINALYSIS, ROUTINE W REFLEX MICROSCOPIC
Bilirubin Urine: NEGATIVE
Glucose, UA: NEGATIVE mg/dL
Hgb urine dipstick: NEGATIVE
Ketones, ur: NEGATIVE mg/dL
Nitrite: NEGATIVE
Protein, ur: NEGATIVE mg/dL
Specific Gravity, Urine: 1.033 — ABNORMAL HIGH (ref 1.005–1.030)
pH: 6 (ref 5.0–8.0)

## 2019-01-20 LAB — PROTIME-INR
INR: 1.5 — ABNORMAL HIGH (ref 0.8–1.2)
Prothrombin Time: 17.6 seconds — ABNORMAL HIGH (ref 11.4–15.2)

## 2019-01-20 LAB — COMPREHENSIVE METABOLIC PANEL
ALT: 13 U/L (ref 0–44)
AST: 24 U/L (ref 15–41)
Albumin: 2.2 g/dL — ABNORMAL LOW (ref 3.5–5.0)
Alkaline Phosphatase: 59 U/L (ref 38–126)
Anion gap: 7 (ref 5–15)
BUN: 8 mg/dL (ref 8–23)
CO2: 23 mmol/L (ref 22–32)
Calcium: 7.6 mg/dL — ABNORMAL LOW (ref 8.9–10.3)
Chloride: 102 mmol/L (ref 98–111)
Creatinine, Ser: 0.79 mg/dL (ref 0.44–1.00)
GFR calc Af Amer: >60 mL/min (ref >60)
GFR calc non Af Amer: >60 mL/min (ref >60)
Glucose, Bld: 104 mg/dL — ABNORMAL HIGH (ref 70–99)
Potassium: 3.2 mmol/L — ABNORMAL LOW (ref 3.5–5.1)
Sodium: 132 mmol/L — ABNORMAL LOW (ref 135–145)
Total Bilirubin: 0.7 mg/dL (ref 0.3–1.2)
Total Protein: 5.6 g/dL — ABNORMAL LOW (ref 6.5–8.1)

## 2019-01-20 LAB — ECHOCARDIOGRAM COMPLETE
Height: 65 in
Weight: 2736 oz

## 2019-01-20 LAB — TROPONIN I (HIGH SENSITIVITY): Troponin I (High Sensitivity): 34 ng/L — ABNORMAL HIGH (ref <18)

## 2019-01-20 MED ORDER — UMECLIDINIUM-VILANTEROL 62.5-25 MCG/INH IN AEPB
1.0000 | INHALATION_SPRAY | Freq: Every day | RESPIRATORY_TRACT | Status: DC
  Administered 2019-01-20 – 2019-02-01 (×13): 1 via RESPIRATORY_TRACT
  Filled 2019-01-20 (×3): qty 14

## 2019-01-20 MED ORDER — SODIUM CHLORIDE 0.9% FLUSH
10.0000 mL | INTRAVENOUS | Status: DC | PRN
  Administered 2019-01-28: 10 mL
  Filled 2019-01-20: qty 40

## 2019-01-20 MED ORDER — FUROSEMIDE 40 MG PO TABS
40.0000 mg | ORAL_TABLET | Freq: Every day | ORAL | Status: DC
  Administered 2019-01-20 – 2019-02-01 (×13): 40 mg via ORAL
  Filled 2019-01-20 (×13): qty 1

## 2019-01-20 MED ORDER — POTASSIUM CHLORIDE CRYS ER 20 MEQ PO TBCR
40.0000 meq | EXTENDED_RELEASE_TABLET | Freq: Three times a day (TID) | ORAL | Status: AC
  Administered 2019-01-20 (×3): 40 meq via ORAL
  Filled 2019-01-20 (×3): qty 2

## 2019-01-20 MED ORDER — POTASSIUM CHLORIDE CRYS ER 20 MEQ PO TBCR: 40.0000 meq | EXTENDED_RELEASE_TABLET | Freq: Every day | ORAL | Status: DC

## 2019-01-20 MED ORDER — ALBUTEROL SULFATE (2.5 MG/3ML) 0.083% IN NEBU
2.5000 mg | INHALATION_SOLUTION | Freq: Four times a day (QID) | RESPIRATORY_TRACT | Status: DC | PRN
  Administered 2019-01-29 (×2): 2.5 mg via RESPIRATORY_TRACT
  Filled 2019-01-20 (×2): qty 3

## 2019-01-20 MED ORDER — LISINOPRIL 10 MG PO TABS: 10.0000 mg | ORAL_TABLET | Freq: Every day | ORAL | Status: DC

## 2019-01-20 MED ORDER — GABAPENTIN 600 MG PO TABS
600.0000 mg | ORAL_TABLET | Freq: Two times a day (BID) | ORAL | Status: DC
  Administered 2019-01-20 – 2019-02-01 (×25): 600 mg via ORAL
  Filled 2019-01-20 (×25): qty 1

## 2019-01-20 MED ORDER — IOHEXOL 350 MG/ML SOLN
75.0000 mL | Freq: Once | INTRAVENOUS | Status: AC | PRN
  Administered 2019-01-20: 75 mL via INTRAVENOUS

## 2019-01-20 MED ORDER — SIMVASTATIN 20 MG PO TABS
20.0000 mg | ORAL_TABLET | Freq: Every day | ORAL | Status: DC
  Administered 2019-01-20 – 2019-02-01 (×13): 20 mg via ORAL
  Filled 2019-01-20 (×13): qty 1

## 2019-01-20 MED ORDER — FLUTICASONE PROPIONATE 50 MCG/ACT NA SUSP: 1.0000 | Freq: Every day | NASAL | Status: DC | PRN

## 2019-01-20 MED ORDER — WARFARIN SODIUM 5 MG PO TABS
5.0000 mg | ORAL_TABLET | Freq: Once | ORAL | Status: AC
  Administered 2019-01-20: 5 mg via ORAL
  Filled 2019-01-20: qty 1

## 2019-01-20 MED ORDER — HYDROCODONE-ACETAMINOPHEN 5-325 MG PO TABS
1.0000 | ORAL_TABLET | ORAL | Status: DC | PRN
  Administered 2019-01-20 – 2019-02-01 (×14): 1 via ORAL
  Filled 2019-01-20 (×14): qty 1

## 2019-01-20 MED ORDER — METOPROLOL SUCCINATE ER 25 MG PO TB24
12.5000 mg | ORAL_TABLET | Freq: Every day | ORAL | Status: DC
  Administered 2019-01-20 – 2019-01-31 (×11): 12.5 mg via ORAL
  Filled 2019-01-20 (×11): qty 1

## 2019-01-20 NOTE — Progress Notes (Signed)
PROGRESS NOTE    Kim Anthony  TKZ:[id number] DOB: January 14, 1951 DOA: 01/18/2019 PCP: Marianna Payment, MD   Brief Narrative:   Kim Anthony  is a 68 y.o. female,  w hypertension, hyperlipidemia, CHF (EF 50-55%), h/o MVR (mechanical), h/o PE on Coumadin, Copd, h/o L breast cancer s/p XRT w chronic wound  who was admitted under plastic surgery on 01/19/2019 for Latissimus myocutaneous flap to reconstruct the left chest wall and apparently developed fever of 103 postoperatively, with hr 113, Bp 121/75 pox 88%,  bp as low as 95/51. Pt transferred to 4N by plastic surgery and medicine consulted to take over her care.  Patient complained of some cough with yellow sputum.  No other complaint.  No shortness of breath.  She was diagnosed with left chest wall abscess/cellulitis, at the site of plastic surgery.  She was started on cefepime vancomycin.  Consultants:   Plastic surgery  General surgery  Procedures:  LEFT LATISSIMUS FLAP TO BREAST on 01/19/2019  Antimicrobials:   IV cefepime and vancomycin started on 01/19/2019   Subjective: Patient seen and examined.  She states that she is having significant pain in the back and the left chest.  Denies any shortness of breath.  Also has some fever, chills and sweating.  No other complaint.  Has history of COPD but does not use oxygen at home.  Objective: Vitals:   01/20/19 0000 01/20/19 0400 01/20/19 0740 01/20/19 0744  BP: 93/65 107/65 102/65   Pulse: 98 99    Resp: (!) 23 (!) 26 19   Temp: 98.9 F (37.2 C) 99.1 F (37.3 C)  (!) 101.6 F (38.7 C)  TempSrc: Oral Oral Oral   SpO2: 97% 95% 100%   Weight:      Height:        Intake/Output Summary (Last 24 hours) at 01/20/2019 0936 Last data filed at 01/20/2019 0748 Gross per 24 hour  Intake 1906.55 ml  Output 1165 ml  Net 741.55 ml   Filed Weights   01/18/19 1900  Weight: 77.6 kg    Examination:  General exam: Appears calm and comfortable but in pain. Respiratory system: Diminished  breath sounds at the bases with some rhonchi. Respiratory effort normal. Cardiovascular system: S1 & S2 heard, RRR. No JVD, murmurs, rubs, gallops or clicks. No pedal edema. Gastrointestinal system: Abdomen is nondistended, soft and nontender. No organomegaly or masses felt. Normal bowel sounds heard. Central nervous system: Alert and oriented. No focal neurological deficits. Extremities: Symmetric 5 x 5 power. Skin: Has dressing at the left breast which is soaked in blood.  Has significant erythema extending from that surgical site with firm skin around it and significantly tender as well. Psychiatry: Judgement and insight appear normal. Mood & affect appropriate.    Data Reviewed: I have personally reviewed following labs and imaging studies  CBC: Recent Labs  Lab 01/19/19 0155 01/19/19 1909 01/20/19 0038  WBC 14.8* 23.3* 24.4*  NEUTROABS  --   --  18.0*  HGB 11.4* 10.5* 9.1*  HCT 37.1 32.8* 28.9*  MCV 99.7 96.8 100.0  PLT 435* 471* 573*   Basic Metabolic Panel: Recent Labs  Lab 01/19/19 0155 01/20/19 0038  NA 139 132*  K 3.9 3.2*  CL 104 102  CO2 25 23  GLUCOSE 115* 104*  BUN 6* 8  CREATININE 0.76 0.79  CALCIUM 8.9 7.6*   GFR: Estimated Creatinine Clearance: 68.3 mL/min (by C-G formula based on SCr of 0.79 mg/dL). Liver Function Tests: Recent Labs  Lab 01/20/19  0038  AST 24  ALT 13  ALKPHOS 59  BILITOT 0.7  PROT 5.6*  ALBUMIN 2.2*   No results for input(s): LIPASE, AMYLASE in the last 168 hours. No results for input(s): AMMONIA in the last 168 hours. Coagulation Profile: Recent Labs  Lab 01/19/19 0155 01/20/19 0038  INR 1.1 1.5*   Cardiac Enzymes: No results for input(s): CKTOTAL, CKMB, CKMBINDEX, TROPONINI in the last 168 hours. BNP (last 3 results) No results for input(s): PROBNP in the last 8760 hours. HbA1C: No results for input(s): HGBA1C in the last 72 hours. CBG: No results for input(s): GLUCAP in the last 168 hours. Lipid Profile: No  results for input(s): CHOL, HDL, LDLCALC, TRIG, CHOLHDL, LDLDIRECT in the last 72 hours. Thyroid Function Tests: No results for input(s): TSH, T4TOTAL, FREET4, T3FREE, THYROIDAB in the last 72 hours. Anemia Panel: No results for input(s): VITAMINB12, FOLATE, FERRITIN, TIBC, IRON, RETICCTPCT in the last 72 hours. Sepsis Labs: Recent Labs  Lab 01/19/19 1909 01/19/19 2141  LATICACIDVEN 1.8 1.6    Recent Results (from the past 240 hour(s))  SARS Coronavirus 2 (Performed in [facility] lab)     Status: None   Collection Time: 01/16/19 10:12 AM   Specimen: Nasal Swab  Result Value Ref Range Status   SARS Coronavirus 2 NEGATIVE NEGATIVE Final    Comment: (NOTE) SARS-CoV-2 target nucleic acids are NOT DETECTED. The SARS-CoV-2 RNA is generally detectable in upper and lower respiratory specimens during the acute phase of infection. Negative results do not preclude SARS-CoV-2 infection, do not rule out co-infections with other pathogens, and should not be used as the sole basis for treatment or other patient management decisions. Negative results must be combined with clinical observations, patient history, and epidemiological information. The expected result is Negative. Fact Sheet for Patients: SugarRoll.be Fact Sheet for Healthcare Providers: https://www.woods-mathews.com/ This test is not yet approved or cleared by the [place] FDA and  has been authorized for detection and/or diagnosis of SARS-CoV-2 by FDA under an Emergency Use Authorization (EUA). This EUA will remain  in effect (meaning this test can be used) for the duration of the COVID-19 declaration under Section 56 4(b)(1) of the Act, 21 U.S.C. section 360bbb-3(b)(1), unless the authorization is terminated or revoked sooner. Performed at [facility], [place] [address], [place],  [address]       Radiology Studies: Ct Angio Chest Pe W Or Wo  Contrast  Result Date: 01/20/2019 CLINICAL DATA:  Tachycardia with fever EXAM: CT ANGIOGRAPHY CHEST WITH CONTRAST TECHNIQUE: Multidetector CT imaging of the chest was performed using the standard protocol during bolus administration of intravenous contrast. Multiplanar CT image reconstructions and MIPs were obtained to evaluate the vascular anatomy. CONTRAST:  28mL OMNIPAQUE IOHEXOL 350 MG/ML SOLN COMPARISON:  Radiograph 01/19/2019, CT chest 10/07/2018, 06/08/2018 FINDINGS: Cardiovascular: Satisfactory opacification of the pulmonary arteries to the segmental level. No evidence of pulmonary embolism. Mild aortic atherosclerosis. Mitral valve prosthesis. Post sternotomy changes. Ectatic aorta without aneurysm. Slightly enlarged pulmonary trunk. Coronary vascular calcification. Marked cardiomegaly. No pericardial effusion. Mediastinum/Nodes: Midline trachea. No thyroid mass. Right axillary lymph nodes measuring up to 13 mm. Slight increased mediastinal adenopathy. Right paratracheal lymph node measures 13 mm. Precarinal lymph node measures 2 cm compared with 16 mm previously. Esophagus within normal limits. Lungs/Pleura: Left apical fibrosis. 6 mm right apical lung nodule, series 7, image number 21, no change. Subpleural fibrosis with calcification in the left upper lobe anteriorly, no change. Emphysematous disease. Diffuse bilateral hazy lung  density. Trace pleural effusion. Upper Abdomen: No acute abnormality. Musculoskeletal: Post mastectomy changes on the left. Drainage catheter within the left lower anterior chest wall. Extensive edema and soft tissue inflammatory change throughout the left chest wall. This extends to the presternal region. Multiple gas and fluid collections within the left chest wall soft tissues anteriorly. Underlying rib irregularity and sclerosis on the left, no change. Review of the MIP images confirms the above findings. IMPRESSION: 1. Negative for acute pulmonary embolus 2. Post  mastectomy changes on the left. Extensive subcutaneous edema and inflammatory change involving the left chest wall anteriorly and laterally, extending to the presternal soft tissues, consistent with soft tissue infection. Numerous gas and fluid collections within the left anterior chest wall soft tissues, concerning for soft tissue abscess or necrosis. 3. Cardiomegaly. Underlying emphysematous disease. Diffuse hazy attenuation throughout the lung fields, could reflect mild edema 4. Increased right axillary and mediastinal adenopathy, possibly reactive 5. Left upper lobe anterior and apical fibrosis 6. No change in 6 mm right apical lung nodule Aortic Atherosclerosis (ICD10-I70.0) and Emphysema (ICD10-J43.9). Electronically Signed   By: Donavan Foil M.D.   On: 01/20/2019 00:50   Dg Chest Port 1 View  Result Date: 01/19/2019 CLINICAL DATA:  Fever EXAM: PORTABLE CHEST 1 VIEW COMPARISON:  01/07/2019, 04/11/2018, 11/25/2017 FINDINGS: Post sternotomy changes with valve prosthesis. Emphysematous disease. Cardiomegaly with vascular congestion. Mild diffuse interstitial and ground-glass opacity, felt consistent with chronic interstitial change. Clips in the left axilla. No pneumothorax. Catheters or leads over the left mid and lower chest. Opacity at the left apex, felt to correspond to fibrosis noted on CT. IMPRESSION: 1. Cardiomegaly with central vascular congestion. Emphysematous disease 2. Mild diffuse bilateral interstitial opacity, likely chronic change. No acute superimposed airspace disease. Electronically Signed   By: Donavan Foil M.D.   On: 01/19/2019 21:00    Scheduled Meds:  enoxaparin (LOVENOX) injection  80 mg Subcutaneous Q12H   furosemide  40 mg Oral Daily   gabapentin  600 mg Oral BID   lisinopril  10 mg Oral QHS   metoprolol succinate  12.5 mg Oral QHS   potassium chloride SA  40 mEq Oral Daily   senna  1 tablet Oral BID   simvastatin  20 mg Oral Daily   umeclidinium-vilanterol  1  puff Inhalation Daily   Warfarin - Pharmacist Dosing Inpatient   Does not apply q1800   Continuous Infusions:  ceFEPime (MAXIPIME) IV 2 g (01/20/19 0508)   vancomycin 1,500 mg (01/19/19 2246)     LOS: 2 days   Assessment & Plan:   Principal Problem:   Fever Active Problems:   Open wound of chest wall, left, initial encounter   Sepsis (Donna)   Tachycardia   Sepsis secondary to left chest wall cellulitis/subcutaneous abscess/postoperative: CT chest was done due to elevated d-dimer however this ruled out any PE but it confirmed significant soft tissue inflammation along with subcutaneous abscess, gas.  I wonder if this needs extensive incision and drainage.  She has been seen by plastic surgery and per their note, they would like to continue antibiotics and monitor however I have consulted general surgery to evaluate her for possible debridement and incision and drainage.  Continue current antibiotics and follow cultures and tailor antibiotics accordingly.  Left upper extremity swelling: Postoperative.  I doubt DVT.  She is already on Coumadin which is subtherapeutic and she has been bridged with Lovenox so I would not pursue any Doppler upper extremity since that would not change  any management.  CAD, history of diastolic congestive heart failure: Compensated and stable.  Continue Toprol-XL, Zocor, Lasix.   Anxiety: Continue Valium as needed.  COPD: Stable.  Continue albuterol as needed.  Chronic pain: To new gabapentin.  Essential hypertension: Blood pressure on the soft side.  Will discontinue lisinopril but continue Toprol-XL and monitor closely.  Hypokalemia: 3.2.  Will replace.  Recheck in a.m.  Chronic anemia: Hemoglobin is stable.  DVT prophylaxis: Coumadin with bridging Lovenox.  Pharmacy dosing Coumadin. Code Status: Full code Family Communication: None present/none requested.  Patient alert and oriented and competent.  Plan of care discussed with the  patient. Disposition Plan: TBD   Time spent: 43 minutes   Darliss Cheney, MD [facility] Pager [phone]  If 7PM-7AM, please contact night-coverage [email or web address] Password [facility] 01/20/2019, 9:36 AM

## 2019-01-20 NOTE — Telephone Encounter (Signed)
Called and spoke with Helene Kelp with [facility] and informed her that Dr. Marla Roe stated that the measurement for the 27 was not for the wrist.  She measured below the elbow at 27, and the wrist would be 22 cm.  Helene Kelp stated that's good and asked if I would refax the script and write the 3 measurements (22,29,34) on the script.  Script refaxed and confirmation received.//AB/CMA

## 2019-01-20 NOTE — Evaluation (Addendum)
Physical Therapy Evaluation Patient Details Name: Kim Anthony [name]: [id number] DOB: [birth year] Today's Date: 01/20/2019   History of Present Illness  Pt is a 68 y.o. F with significant PMH of hypertension, CHF (EF 50-55%), h/o MVR, PE, L breast cancer s/p XRT with chronic wound who presented on 01/19/2019 for latissimus myocutaneous flap to reconstruct the left chest wall and developed sepsis.  Clinical Impression  Pt admitted with above diagnosis. Pt currently with functional limitations due to the deficits listed below (see PT Problem List). Prior to admission, pt lives with her daughter and states she is independent with ADL's/mobility. Difficult to assess cognition and confirm history as pt distracted by left chest wall/shoulder pain. Requiring min assist for transfers and limited ambulation to chair, deferring further ambulation at this time. HR peak 106 bpm, 91% SpO2 on RA. Will benefit from further mastectomy education, including postural re-education, gentle ROM, and positioning. See below for follow up recommendations.      Follow Up Recommendations Home health PT;Supervision/Assistance - 24 hour (OP Cancer Rehab referral for LUE compression sleeve)    Equipment Recommendations  Cane    Recommendations for Other Services       Precautions / Restrictions Precautions Precautions: Fall;Other (comment) Precaution Comments: 3 JP drains Restrictions Weight Bearing Restrictions: No      Mobility  Bed Mobility Overal bed mobility: Needs Assistance Bed Mobility: Supine to Sit     Supine to sit: Min assist     General bed mobility comments: Min assist to pull up with RUE to sitting  Transfers Overall transfer level: Needs assistance Equipment used: 1 person hand held assist Transfers: Sit to/from [facility] Sit to Stand: Min assist Stand pivot transfers: Min assist       General transfer comment: MinA to initiate standing from edge of bed. Once standing,  pt feeling "hot," and sat back down. Then stood up again and guided to chair via hand over hand guidance, cues for sequencing and direction  Ambulation/Gait             General Gait Details: Pt deferring  Stairs            Wheelchair Mobility    Modified Rankin (Stroke Patients Only)       Balance Overall balance assessment: Needs assistance Sitting-balance support: Feet supported Sitting balance-Leahy Scale: Good     Standing balance support: Single extremity supported;During functional activity Standing balance-Leahy Scale: Poor Standing balance comment: reliant on external support                             Pertinent Vitals/Pain Pain Assessment: Faces Faces Pain Scale: Hurts whole lot Pain Location: surgical site Pain Descriptors / Indicators: Operative site guarding;Grimacing Pain Intervention(s): Limited activity within patient's tolerance;Monitored during session;Premedicated before session;Repositioned    Home Living Family/patient expects to be discharged to:: Private residence Living Arrangements: Children(daughter) Available Help at Discharge: Family;Available 24 hours/day Type of Home: House Home Access: Stairs to enter   [employer] of Steps: 4 Home Layout: One level Home Equipment: None      Prior Function Level of Independence: Independent         Comments: Reports she is community ambulatory but has been "staying in with it being so hot."     Hand Dominance        Extremity/Trunk Assessment   Upper Extremity Assessment Upper Extremity Assessment: LUE deficits/detail LUE Deficits / Details: Edematous, shoulder AROM to ~  40 degrees, elbow/wrist/hand WFL    Lower Extremity Assessment Lower Extremity Assessment: Overall WFL for tasks assessed       Communication   Communication: No difficulties  Cognition Arousal/Alertness: Awake/alert Behavior During Therapy: WFL for tasks assessed/performed Overall  Cognitive Status: Impaired/Different from baseline Area of Impairment: Attention;Problem solving                   Current Attention Level: Sustained         Problem Solving: Slow processing;Difficulty sequencing;Requires verbal cues;Requires tactile cues General Comments: Suspect due to medications, internally distracted by pain      General Comments      Exercises     Assessment/Plan    PT Assessment Patient needs continued PT services  PT Problem List Decreased strength;Decreased range of motion;Decreased balance;Decreased activity tolerance;Decreased mobility;Decreased cognition;Pain       PT Treatment Interventions DME instruction;Gait training;Stair training;Functional mobility training;Therapeutic activities;Therapeutic exercise;Balance training;Patient/family education    PT Goals (Current goals can be found in the Care Plan section)  Acute Rehab PT Goals Patient Stated Goal: "get up to the recliner." PT Goal Formulation: With patient Time For Goal Achievement: 02/03/19 Potential to Achieve Goals: Good    Frequency Min 3X/week   Barriers to discharge        Co-evaluation               AM-PAC PT "6 Clicks" Mobility  Outcome Measure Help needed turning from your back to your side while in a flat bed without using bedrails?: None Help needed moving from lying on your back to sitting on the side of a flat bed without using bedrails?: A Little Help needed moving to and from a bed to a chair (including a wheelchair)?: A Little Help needed standing up from a chair using your arms (e.g., wheelchair or bedside chair)?: A Little Help needed to walk in hospital room?: A Little Help needed climbing 3-5 steps with a railing? : A Lot 6 Click Score: 18    End of Session   Activity Tolerance: Patient limited by pain Patient left: in chair;with call bell/phone within reach;with chair alarm set Nurse Communication: Mobility status PT Visit Diagnosis:  Unsteadiness on feet (R26.81);Pain;Difficulty in walking, not elsewhere classified (R26.2) Pain - Right/Left: Left Pain - part of body: (chest wall)    Time: 2395-3202 PT Time Calculation (min) (ACUTE ONLY): 32 min   Charges:   PT Evaluation $PT Eval Moderate Complexity: 1 Mod PT Treatments $Therapeutic Activity: 8-22 mins       Ellamae Sia, PT, DPT Acute Rehabilitation Services Pager [phone] Office [phone]   Willy Eddy 01/20/2019, 9:57 AM

## 2019-01-20 NOTE — Progress Notes (Signed)
ANTICOAGULATION CONSULT NOTE - Follow Up Consult  Pharmacy Consult for Coumadin Indication:  mech MV and chronic PE  Allergies  Allergen Reactions  . Acetaminophen-Codeine Itching  . Propoxyphene Itching    Darvocet  . Tramadol Itching and Nausea And Vomiting    Patient Measurements: Height: 5\' 5"  (165.1 cm) Weight: 171 lb (77.6 kg) IBW/kg (Calculated) : 57  Vital Signs: Temp: 98.1 F (36.7 C) (07/31 1121) Temp Source: Oral (07/31 1121) BP: 134/80 (07/31 1121) Pulse Rate: 94 (07/31 1121)  Labs: Recent Labs    01/19/19 0155 01/19/19 1909 01/19/19 2141 01/20/19 0038  HGB 11.4* 10.5*  --  9.1*  HCT 37.1 32.8*  --  28.9*  PLT 435* 471*  --  421*  LABPROT 14.4  --   --  17.6*  INR 1.1  --   --  1.5*  CREATININE 0.76  --   --  0.79  TROPONINIHS  --   --  36* 34*    Estimated Creatinine Clearance: 68.3 mL/min (by C-G formula based on SCr of 0.79 mg/dL).   Assessment: Anticoag: Lovenox and Warfarin for hx mech MV and chronic PE.  Era Skeen manages [facility]. Has been off Warfarin, on Enox 120 mg sq q24hrs. INR 1.1. Hgb 11.4 down with post-op anemia.  PTA Warfarin: 5 mg daily except 7.5 mg on Wed and Sun. Last dose 7/23. INR was 3 that day. Has home INR monitor.  Lovenox 120 mg sq q24hrs begun 7/24>>7/27, then 60 mg on 7/28 at 8am  Today, INR up from 1.1 to 1.5 after 5mg  on 7/29 and 10mg  on 7/30. Suspect will see jump in INR tomorrow in response to 10mg  dose as well as one time dose of Bactrim. Now on Vancomycin and Cefepime.   Goal of Therapy:  INR 2.5-3.5 Monitor platelets by anticoagulation protocol: Yes   Plan:  Continue Lovenox 80mg /12h Warfarin 5 mg x 1 tonight. Daily PT/INR, goal 2.5-3.5  Sloan Leiter, PharmD, BCPS, BCCCP Clinical Pharmacist Please refer to [facility] for [place] numbers 01/20/2019,12:03 PM

## 2019-01-20 NOTE — Telephone Encounter (Addendum)
Called [facility] on (01/16/19) and spoke with Helene Kelp regarding the message below.  She stated that the measurements that were given to her by the patient's daughter which were (27 wrist,29 mid level arm, and 34 mid upper arm) were off the range shelf for what they use.  She stated that the wrist measurement that we gave was 27 and the largest wrist they carry is (19-22 cm).  She stated that she's not sure when the measurements were taking, but measurement taking in the morning always seem to be better.  Informed her that I will speak with Dr. Marla Roe and give her a call back.  Helene Kelp verbalized understanding and agreed.//AB/CMA

## 2019-01-20 NOTE — Progress Notes (Signed)
Attempted upper extremity venous duplex x2- first time patient was in chair eating, and at this time IV team is working with patient. We will attempt again later today or tomorrow as schedule permits.  .01/20/2019 2:48 PM Maudry Mayhew, MHA, RVT, RDCS, RDMS

## 2019-01-20 NOTE — Progress Notes (Signed)
Echocardiogram 2D Echocardiogram has been performed.  Oneal Deputy Amanpreet Delmont 01/20/2019, 11:19 AM

## 2019-01-20 NOTE — Progress Notes (Addendum)
2 Days Post-Op  Subjective: Kim Anthony was transferred to 4N progressive care last night due to increased temp, Tmax 103, tachycardia, Hypotension (95/51) and left chest wall erythema. Medicine team consulted to assist.   Kim Anthony lying in bed this AM on exam. She reports other than the pain along her left shoulder/chest wall and cough, she has no other complaints. She still has 3 drains in place, sanguinous fluid in each. During exam, she is intermittently writhing in pain. Significant tenderness to palpation along left chest/shoulder.   Dressings over her latissimus flap incision soiled with blood. Appears to be slight opening of her superior medial latissimus incision. Sutures still in place, but mild dehiscence noted. No purulent drainage noted at this time.  She continues to have normal BM and is eating and drinking.   She denies any CP, dysuria, diarrhea, n/v.   Objective: Vital signs in last 24 hours: Temp:  [98.6 F (37 C)-103 F (39.4 C)] 101.6 F (38.7 C) (07/31 0744) Pulse Rate:  [90-113] 99 (07/31 0400) Resp:  [16-28] 19 (07/31 0740) BP: (93-128)/(51-76) 102/65 (07/31 0740) SpO2:  [94 %-100 %] 100 % (07/31 0740) Last BM Date: 01/18/19  Intake/Output from previous day: 07/30 0701 - 07/31 0700 In: 2386.6 [P.O.:480; I.V.:1029.6; IV Piggyback:877] Out: 1050 [Urine:800; Drains:250] Intake/Output this shift: Total I/O In: -  Out: 170 [Drains:170]  General appearance: alert, cooperative, mild distress and writhing in pain Head: Normocephalic, without obvious abnormality, atraumatic Eyes: yellow sclera Neck: no adenopathy and supple, symmetrical, trachea midline Back: left back bandage  Resp: crackles noted on exam at the apices, bilaterally. Chest wall: left sided chest wall tenderness, Erythema extending towards midline, past sternum. Erythema also extending inferiorly past inframmary fold and into the shoulder. Erythema marked with surgical pen to track  changes. Breasts: Normal appearing right breast. Left breast with lattisimus flap in place, chest wall is hard/swollen, bloody drainage noted around the inferior medial and superior medial incisions. Mild dehiscence noted of superior medial aspect of her incision. Sutures still in place.  Cardio: tachycardic, murmur noted GI: soft, non-tender; bowel sounds normal; no masses,  no organomegaly Extremities: extremities normal, atraumatic, no cyanosis or edema and SED in place. Pulses: 2+ and symmetric Skin: See above breast exam. Erythema noted along left chest wall, extending toward sternum, shoulder, inframmary fold and into the neck Incision/Wound: see above  Lab Results:  @LABLAST2 (wbc:2,hgb:2,hct:2,plt:2) BMET Recent Labs    01/19/19 0155 01/20/19 0038  NA 139 132*  K 3.9 3.2*  CL 104 102  CO2 25 23  GLUCOSE 115* 104*  BUN 6* 8  CREATININE 0.76 0.79  CALCIUM 8.9 7.6*   PT/INR Recent Labs    01/19/19 0155 01/20/19 0038  LABPROT 14.4 17.6*  INR 1.1 1.5*   ABG No results for input(s): PHART, HCO3 in the last 72 hours.  Invalid input(s): PCO2, PO2  Studies/Results: Ct Angio Chest Pe W Or Wo Contrast  Result Date: 01/20/2019 CLINICAL DATA:  Tachycardia with fever EXAM: CT ANGIOGRAPHY CHEST WITH CONTRAST TECHNIQUE: Multidetector CT imaging of the chest was performed using the standard protocol during bolus administration of intravenous contrast. Multiplanar CT image reconstructions and MIPs were obtained to evaluate the vascular anatomy. CONTRAST:  89mL OMNIPAQUE IOHEXOL 350 MG/ML SOLN COMPARISON:  Radiograph 01/19/2019, CT chest 10/07/2018, 06/08/2018 FINDINGS: Cardiovascular: Satisfactory opacification of the pulmonary arteries to the segmental level. No evidence of pulmonary embolism. Mild aortic atherosclerosis. Mitral valve prosthesis. Post sternotomy changes. Ectatic aorta without aneurysm. Slightly enlarged pulmonary trunk. Coronary  vascular calcification. Marked  cardiomegaly. No pericardial effusion. Mediastinum/Nodes: Midline trachea. No thyroid mass. Right axillary lymph nodes measuring up to 13 mm. Slight increased mediastinal adenopathy. Right paratracheal lymph node measures 13 mm. Precarinal lymph node measures 2 cm compared with 16 mm previously. Esophagus within normal limits. Lungs/Pleura: Left apical fibrosis. 6 mm right apical lung nodule, series 7, image number 21, no change. Subpleural fibrosis with calcification in the left upper lobe anteriorly, no change. Emphysematous disease. Diffuse bilateral hazy lung density. Trace pleural effusion. Upper Abdomen: No acute abnormality. Musculoskeletal: Post mastectomy changes on the left. Drainage catheter within the left lower anterior chest wall. Extensive edema and soft tissue inflammatory change throughout the left chest wall. This extends to the presternal region. Multiple gas and fluid collections within the left chest wall soft tissues anteriorly. Underlying rib irregularity and sclerosis on the left, no change. Review of the MIP images confirms the above findings. IMPRESSION: 1. Negative for acute pulmonary embolus 2. Post mastectomy changes on the left. Extensive subcutaneous edema and inflammatory change involving the left chest wall anteriorly and laterally, extending to the presternal soft tissues, consistent with soft tissue infection. Numerous gas and fluid collections within the left anterior chest wall soft tissues, concerning for soft tissue abscess or necrosis. 3. Cardiomegaly. Underlying emphysematous disease. Diffuse hazy attenuation throughout the lung fields, could reflect mild edema 4. Increased right axillary and mediastinal adenopathy, possibly reactive 5. Left upper lobe anterior and apical fibrosis 6. No change in 6 mm right apical lung nodule Aortic Atherosclerosis (ICD10-I70.0) and Emphysema (ICD10-J43.9). Electronically Signed   By: Donavan Foil M.D.   On: 01/20/2019 00:50   Dg Chest  Port 1 View  Result Date: 01/19/2019 CLINICAL DATA:  Fever EXAM: PORTABLE CHEST 1 VIEW COMPARISON:  01/07/2019, 04/11/2018, 11/25/2017 FINDINGS: Post sternotomy changes with valve prosthesis. Emphysematous disease. Cardiomegaly with vascular congestion. Mild diffuse interstitial and ground-glass opacity, felt consistent with chronic interstitial change. Clips in the left axilla. No pneumothorax. Catheters or leads over the left mid and lower chest. Opacity at the left apex, felt to correspond to fibrosis noted on CT. IMPRESSION: 1. Cardiomegaly with central vascular congestion. Emphysematous disease 2. Mild diffuse bilateral interstitial opacity, likely chronic change. No acute superimposed airspace disease. Electronically Signed   By: Donavan Foil M.D.   On: 01/19/2019 21:00    Anti-infectives: Anti-infectives (From admission, onward)   Start     Dose/Rate Route Frequency Ordered Stop   01/19/19 2200  ceFEPIme (MAXIPIME) 2 g in sodium chloride 0.9 % 100 mL IVPB     2 g 200 mL/hr over 30 Minutes Intravenous Every 8 hours 01/19/19 2120     01/19/19 2200  vancomycin (VANCOCIN) 1,500 mg in sodium chloride 0.9 % 500 mL IVPB     1,500 mg 250 mL/hr over 120 Minutes Intravenous Every 24 hours 01/19/19 2123     01/19/19 1900  sulfamethoxazole-trimethoprim (BACTRIM) 291.04 mg in dextrose 5 % 250 mL IVPB  Status:  Discontinued     15 mg/kg/day  77.6 kg 268.2 mL/hr over 60 Minutes Intravenous Every 6 hours 01/19/19 1824 01/19/19 2054   01/18/19 2200  ceFAZolin (ANCEF) IVPB 2g/100 mL premix  Status:  Discontinued     2 g 200 mL/hr over 30 Minutes Intravenous Every 8 hours 01/18/19 1804 01/19/19 2042   01/18/19 1155  ceFAZolin (ANCEF) 2-4 GM/100ML-% IVPB    Note to Pharmacy: Merryl Hacker   : cabinet override      01/18/19 1155 01/18/19 2359  Assessment/Plan: s/p Procedure(s): LEFT LATISSIMUS FLAP TO BREAST  Medicine consulted to assist with management. Kim Anthony had a Tmax of 103 yesterday  and became hypotensive/tachycardic. Transferred to [facility] progressive care for close monitoring.   CT scan showing multiple gas and fluid collections within anterior chest wall soft tissues, will continue to monitor response to antibiotics. Marked Mrs. Bembenek left chest wall erythematous borders to follow her progress. Redness along the chest wall potentially a combination from infection vs radiation treatment in the past  Mild opening of left chest wall wound along the superior medial incision. Continue to monitor. Chest wall gauze and ABD pad changed this AM.   Appreciate assistance from medical team and pharmacy.    LOS: 2 days    Charlies Constable, PA-C 01/20/2019  [phone] Call with any questions  UPDATE BY 10:12 AM: Received a call back from Legrand Como, nurse practitioner from general surgery who mentioned that they reviewed patient's chart and he also discussed with his attending and that since this is postoperative surgical complication done by plastic surgery so they do not feel comfortable jumping in the case.  He also mentioned that if plastic surgery needs general surgery then they would not they should consult them.  I will defer further management regarding decisions about any surgical intervention from now to plastic surgery.

## 2019-01-21 ENCOUNTER — Inpatient Hospital Stay (HOSPITAL_COMMUNITY): Payer: Medicare Other

## 2019-01-21 DIAGNOSIS — M7989 Other specified soft tissue disorders: Secondary | ICD-10-CM

## 2019-01-21 LAB — BASIC METABOLIC PANEL
Anion gap: 4 — ABNORMAL LOW (ref 5–15)
BUN: 8 mg/dL (ref 8–23)
CO2: 20 mmol/L — ABNORMAL LOW (ref 22–32)
Calcium: 7.7 mg/dL — ABNORMAL LOW (ref 8.9–10.3)
Chloride: 110 mmol/L (ref 98–111)
Creatinine, Ser: 0.73 mg/dL (ref 0.44–1.00)
GFR calc Af Amer: >60 mL/min (ref >60)
GFR calc non Af Amer: >60 mL/min (ref >60)
Glucose, Bld: 114 mg/dL — ABNORMAL HIGH (ref 70–99)
Potassium: 4.1 mmol/L (ref 3.5–5.1)
Sodium: 134 mmol/L — ABNORMAL LOW (ref 135–145)

## 2019-01-21 LAB — CBC
HCT: 26.5 % — ABNORMAL LOW (ref 36.0–46.0)
Hemoglobin: 8.4 g/dL — ABNORMAL LOW (ref 12.0–15.0)
MCH: 31 pg (ref 26.0–34.0)
MCHC: 31.7 g/dL (ref 30.0–36.0)
MCV: 97.8 fL (ref 80.0–100.0)
Platelets: 354 10*3/uL (ref 150–400)
RBC: 2.71 MIL/uL — ABNORMAL LOW (ref 3.87–5.11)
RDW: 12.5 % (ref 11.5–15.5)
WBC: 20.4 10*3/uL — ABNORMAL HIGH (ref 4.0–10.5)
nRBC: 0 % (ref 0.0–0.2)

## 2019-01-21 LAB — PROTIME-INR
INR: 1.4 — ABNORMAL HIGH (ref 0.8–1.2)
Prothrombin Time: 17.1 seconds — ABNORMAL HIGH (ref 11.4–15.2)

## 2019-01-21 MED ORDER — WARFARIN SODIUM 5 MG PO TABS
5.0000 mg | ORAL_TABLET | Freq: Once | ORAL | Status: AC
  Administered 2019-01-21: 18:00:00 5 mg via ORAL
  Filled 2019-01-21: qty 1

## 2019-01-21 MED ORDER — ACETAMINOPHEN 325 MG PO TABS
650.0000 mg | ORAL_TABLET | Freq: Four times a day (QID) | ORAL | Status: DC | PRN
  Administered 2019-01-24: 650 mg via ORAL
  Filled 2019-01-21: qty 2

## 2019-01-21 MED ORDER — SODIUM CHLORIDE 0.9 % IV BOLUS
1000.0000 mL | Freq: Once | INTRAVENOUS | Status: AC
  Administered 2019-01-21: 1000 mL via INTRAVENOUS

## 2019-01-21 NOTE — Progress Notes (Signed)
Call placed to answering service for Dr.Dillingham.Awaiting a response.

## 2019-01-21 NOTE — Progress Notes (Signed)
ANTICOAGULATION CONSULT NOTE - Follow Up Consult  Pharmacy Consult for Coumadin Indication:  mech MV and chronic PE  Allergies  Allergen Reactions  . Acetaminophen-Codeine Itching  . Propoxyphene Itching    Darvocet  . Tramadol Itching and Nausea And Vomiting    Patient Measurements: Height: 5\' 5"  (165.1 cm) Weight: 171 lb (77.6 kg) IBW/kg (Calculated) : 57  Vital Signs: Temp: 98 F (36.7 C) (08/01 1143) Temp Source: Oral (08/01 1143) BP: 93/75 (08/01 1143) Pulse Rate: 92 (08/01 1143)  Labs: Recent Labs    01/19/19 0155 01/19/19 1909 01/19/19 2141 01/20/19 0038 01/21/19 0602 01/21/19 0711  HGB 11.4* 10.5*  --  9.1*  --  8.4*  HCT 37.1 32.8*  --  28.9*  --  26.5*  PLT 435* 471*  --  421*  --  354  LABPROT 14.4  --   --  17.6* 17.1*  --   INR 1.1  --   --  1.5* 1.4*  --   CREATININE 0.76  --   --  0.79 0.73  --   TROPONINIHS  --   --  36* 34*  --   --     Estimated Creatinine Clearance: 68.3 mL/min (by C-G formula based on SCr of 0.73 mg/dL).   Assessment: Anticoag: Lovenox and Warfarin for hx mech MV and chronic PE.  Era Skeen manages [facility] Has been off Warfarin, on Enox 120 mg sq q24hrs. INR 1.1. Hgb 11.4 down with post-op anemia.  PTA Warfarin: 5 mg daily except 7.5 mg on Wed and Sun. Last dose 7/23. INR was 3 that day. Has home INR monitor.  Lovenox 120 mg sq q24hrs begun 7/24>>7/27, then 60 mg on 7/28 at 8am  Today, INR 1.4. Remains on Vancomycin and Cefepime. H/H trending down. Platelets are stable. Balancing bleed vs clot risk with chest wall wound and recent procedures- slowly increasing.   Goal of Therapy:  INR 2.5-3.5 Monitor platelets by anticoagulation protocol: Yes   Plan:  Continue Lovenox 80mg /12h Warfarin 5 mg x 1 tonight.  Daily PT/INR, goal 2.5-3.5  Sloan Leiter, PharmD, BCPS, BCCCP Clinical Pharmacist Please refer to [facility] for [place] numbers 01/21/2019,11:58 AM

## 2019-01-21 NOTE — Progress Notes (Signed)
VASCULAR LAB PRELIMINARY  PRELIMINARY  PRELIMINARY  PRELIMINARY  Left upper extremity venous duplex completed.    Preliminary report:  See CV proc for preliminary results.   Nely Dedmon, RVT 01/21/2019, 10:00 AM

## 2019-01-21 NOTE — Progress Notes (Signed)
3 Days Post-Op  Subjective: Kim Anthony is 3 days postop from left latissimus myocutaneous flap.  Nursing reported a temp of 103.1 this morning at 5 AM.  On exam today she is oriented back to baseline. She was able to recall where she was and who I was. She reports that earlier this morning she did feel like she had a temp and felt hot and sweaty.  White blood cell count still elevated at 20  Nursing also reported that last night they noticed her incision had opened up.  The superior aspect of latissimus flap incision has dehisced approximately 1 cm in width by 9 cm across.  Her flap has good capillary refill.  There is no purulent drainage noted. Bloody drainage on ABD pad on exam, breast binder soiled with blood as well.  She still has a lot of drain output, mostly drain 1 with 320 cc yesterday. Total 510 cc.  The fluid is serosanguinous.   Continues to eat and drink plenty fluids. She has a slight cough and has been producing yellowish sputum, which was in a cup at the bedside on exam.  She continues to urinate and have normal BM.  Denies CP, shortness of breast, nausea, vomiting, diarrhea. Spoke with Mrs. [name], her daughter to update her.   Objective: Vital signs in last 24 hours: Temp:  [98.1 F (36.7 C)-103.1 F (39.5 C)] 99.4 F (37.4 C) (08/01 0600) Pulse Rate:  [48-103] 87 (08/01 0740) Resp:  [17-29] 18 (08/01 0740) BP: (103-134)/(46-80) 103/67 (08/01 0740) SpO2:  [94 %-99 %] 96 % (08/01 0745) Last BM Date: 01/19/19  Intake/Output from previous day: 07/31 0701 - 08/01 0700 In: 820 [P.O.:820] Out: 510 [Drains:510] Intake/Output this shift: No intake/output data recorded.  General appearance: alert, cooperative, mild distress and Resting in bed. Head: Normocephalic, without obvious abnormality, atraumatic Eyes: Yellow sclera bilaterally, baseline Back: Left back wall incision approximated. No erythema.  No purulence or dehiscence noted. Resp: Breath sounds are  shallow.  Mild expiratory wheezes noted on exam. No respiratory distress. Nasal cannula in place. Yellow sputum at bedside.  Chest wall: tender to palpation. some edema noted along peri-wound area. Erythematous with extension to sternum and inframmary fold. Redness has not extended towards inferiorly or medially. Erythema extending down shoulder and left arm and left neck.  Breasts: normal right breast. Left latissimus flap incision has dehisced at the superior incision ~ 1cm x 9cm. No purulent drainage. Inferior and medial/lateral superior incisions intact. Latissimus flap with good capillary refill.  MSK: limited range of motion of left arm. Cardio: s1, s1 noted. regular rate and rhythm. Murmur noted.  Extremities: extremities normal, atraumatic, no cyanosis or edema Pulses: 2+ and symmetric Psych: Judgement and Affect normal. Mood normal.  Lab Results:  CBC    Component Value Date/Time   WBC 20.4 (H) 01/21/2019 0711   RBC 2.71 (L) 01/21/2019 0711   HGB 8.4 (L) 01/21/2019 0711   HGB 12.5 04/21/2018 1624   HCT 26.5 (L) 01/21/2019 0711   HCT 36.0 04/21/2018 1624   PLT 354 01/21/2019 0711   PLT 403 04/21/2018 1624   MCV 97.8 01/21/2019 0711   MCV 94 04/21/2018 1624   MCH 31.0 01/21/2019 0711   MCHC 31.7 01/21/2019 0711   RDW 12.5 01/21/2019 0711   RDW 11.4 (L) 04/21/2018 1624   LYMPHSABS 3.9 01/20/2019 0038   LYMPHSABS 3.0 04/21/2018 1624   MONOABS 2.1 (H) 01/20/2019 0038   EOSABS 0.1 01/20/2019 0038   EOSABS 0.2 04/21/2018  1624   BASOSABS 0.1 01/20/2019 0038   BASOSABS 0.1 04/21/2018 1624    BMET Recent Labs    01/20/19 0038 01/21/19 0602  NA 132* 134*  K 3.2* 4.1  CL 102 110  CO2 23 20*  GLUCOSE 104* 114*  BUN 8 8  CREATININE 0.79 0.73  CALCIUM 7.6* 7.7*   PT/INR Recent Labs    01/20/19 0038 01/21/19 0602  LABPROT 17.6* 17.1*  INR 1.5* 1.4*   ABG No results for input(s): PHART, HCO3 in the last 72 hours.  Invalid input(s): PCO2,  PO2  Studies/Results: Ct Angio Chest Pe W Or Wo Contrast  Result Date: 01/20/2019 CLINICAL DATA:  Tachycardia with fever EXAM: CT ANGIOGRAPHY CHEST WITH CONTRAST TECHNIQUE: Multidetector CT imaging of the chest was performed using the standard protocol during bolus administration of intravenous contrast. Multiplanar CT image reconstructions and MIPs were obtained to evaluate the vascular anatomy. CONTRAST:  69mL OMNIPAQUE IOHEXOL 350 MG/ML SOLN COMPARISON:  Radiograph 01/19/2019, CT chest 10/07/2018, 06/08/2018 FINDINGS: Cardiovascular: Satisfactory opacification of the pulmonary arteries to the segmental level. No evidence of pulmonary embolism. Mild aortic atherosclerosis. Mitral valve prosthesis. Post sternotomy changes. Ectatic aorta without aneurysm. Slightly enlarged pulmonary trunk. Coronary vascular calcification. Marked cardiomegaly. No pericardial effusion. Mediastinum/Nodes: Midline trachea. No thyroid mass. Right axillary lymph nodes measuring up to 13 mm. Slight increased mediastinal adenopathy. Right paratracheal lymph node measures 13 mm. Precarinal lymph node measures 2 cm compared with 16 mm previously. Esophagus within normal limits. Lungs/Pleura: Left apical fibrosis. 6 mm right apical lung nodule, series 7, image number 21, no change. Subpleural fibrosis with calcification in the left upper lobe anteriorly, no change. Emphysematous disease. Diffuse bilateral hazy lung density. Trace pleural effusion. Upper Abdomen: No acute abnormality. Musculoskeletal: Post mastectomy changes on the left. Drainage catheter within the left lower anterior chest wall. Extensive edema and soft tissue inflammatory change throughout the left chest wall. This extends to the presternal region. Multiple gas and fluid collections within the left chest wall soft tissues anteriorly. Underlying rib irregularity and sclerosis on the left, no change. Review of the MIP images confirms the above findings. IMPRESSION: 1.  Negative for acute pulmonary embolus 2. Post mastectomy changes on the left. Extensive subcutaneous edema and inflammatory change involving the left chest wall anteriorly and laterally, extending to the presternal soft tissues, consistent with soft tissue infection. Numerous gas and fluid collections within the left anterior chest wall soft tissues, concerning for soft tissue abscess or necrosis. 3. Cardiomegaly. Underlying emphysematous disease. Diffuse hazy attenuation throughout the lung fields, could reflect mild edema 4. Increased right axillary and mediastinal adenopathy, possibly reactive 5. Left upper lobe anterior and apical fibrosis 6. No change in 6 mm right apical lung nodule Aortic Atherosclerosis (ICD10-I70.0) and Emphysema (ICD10-J43.9). Electronically Signed   By: Donavan Foil M.D.   On: 01/20/2019 00:50   Dg Chest Port 1 View  Result Date: 01/19/2019 CLINICAL DATA:  Fever EXAM: PORTABLE CHEST 1 VIEW COMPARISON:  01/07/2019, 04/11/2018, 11/25/2017 FINDINGS: Post sternotomy changes with valve prosthesis. Emphysematous disease. Cardiomegaly with vascular congestion. Mild diffuse interstitial and ground-glass opacity, felt consistent with chronic interstitial change. Clips in the left axilla. No pneumothorax. Catheters or leads over the left mid and lower chest. Opacity at the left apex, felt to correspond to fibrosis noted on CT. IMPRESSION: 1. Cardiomegaly with central vascular congestion. Emphysematous disease 2. Mild diffuse bilateral interstitial opacity, likely chronic change. No acute superimposed airspace disease. Electronically Signed   By: Madie Reno.D.  On: 01/19/2019 21:00    Anti-infectives: Anti-infectives (From admission, onward)   Start     Dose/Rate Route Frequency Ordered Stop   01/19/19 2200  ceFEPIme (MAXIPIME) 2 g in sodium chloride 0.9 % 100 mL IVPB     2 g 200 mL/hr over 30 Minutes Intravenous Every 8 hours 01/19/19 2120     01/19/19 2200  vancomycin (VANCOCIN)  1,500 mg in sodium chloride 0.9 % 500 mL IVPB     1,500 mg 250 mL/hr over 120 Minutes Intravenous Every 24 hours 01/19/19 2123     01/19/19 1900  sulfamethoxazole-trimethoprim (BACTRIM) 291.04 mg in dextrose 5 % 250 mL IVPB  Status:  Discontinued     15 mg/kg/day  77.6 kg 268.2 mL/hr over 60 Minutes Intravenous Every 6 hours 01/19/19 1824 01/19/19 2054   01/18/19 2200  ceFAZolin (ANCEF) IVPB 2g/100 mL premix  Status:  Discontinued     2 g 200 mL/hr over 30 Minutes Intravenous Every 8 hours 01/18/19 1804 01/19/19 2042   01/18/19 1155  ceFAZolin (ANCEF) 2-4 GM/100ML-% IVPB    Note to Pharmacy: Merryl Hacker   : cabinet override      01/18/19 1155 01/18/19 2359      Assessment/Plan: s/p Procedure(s): LEFT LATISSIMUS FLAP TO BREAST  Reactive tissue likely due to radiation and will continue to monitor. Incision partial opening not unexpected.   WBC trending down, 20 today from 24 yesterday. Appreciate medicine's assistance with medical management. She is currently on vancomycin and cefepime.   Dressing changes BID or as needed based on soil level. Nursing to apply xeroform BID to dehisced incision, 4x4 gauze and ABD pad. New breast binder provided. Back wound healing nicely, new dressing placed this AM. Appreciate nursing assistance with monitoring and dressing changes.  Drains still in place.   Appreciate assistance from medical team and pharmacy.   Call with any questions. Cell phone is [phone].   LOS: 3 days   Charlies Constable, PA-C 01/21/2019 [phone]

## 2019-01-21 NOTE — Progress Notes (Signed)
PROGRESS NOTE    Kim Anthony  YCX:[id number] DOB: [birth year] DOA: 01/18/2019 PCP: Marianna Payment, MD    Brief Narrative:  68 year old female with hypertension, hyperlipidemia, chronic diastolic heart failure, history of MVR with mechanical valve and PE on Coumadin, COPD, history of left breast cancer status post radiation therapy with chronic wound who underwent left latissimus myocutaneous flap to reconstruct the chest wall by plastic surgery on 01/19/2019.  Postoperatively patient developed temperature of 103, tachycardia and hypoxia and she was transferred to 4 N. surgical unit for admission and treatment.  Patient also has some cough with yellowish sputum.  No other complaints.  She has postop site with swelling and edema, seen by plastic surgery.  Remains on vancomycin and cefepime.   Assessment & Plan:   Principal Problem:   Fever Active Problems:   Open wound of chest wall, left, initial encounter   Sepsis ([place])   Tachycardia  Sepsis secondary to left chest wall cellulitis/subcutaneous collection: No other source of infection.  There is significant soft tissue swelling and inflammation along the subcutaneous area.  Seen and followed by surgery.  Patient remains on broad-spectrum antibiotics with vancomycin and cefepime.  Temperature maximum 103 last night.  WBC count is 20,000.  Patient has multiple drains and they are draining serosanguineous fluid. Surgery do not anticipate any debridement.  We will continue local wound care and IV antibiotics.  Cultures are negative so far.  Left upper extremity swelling: Negative DVT.  Mechanical mitral valve and history of PE: Resumed on Coumadin.  Remains on therapeutic Lovenox for bridging.  Acute on chronic anemia: As anticipated from surgical blood loss.  We will continue to monitor levels.  No indication for transfusion at this time.  Coronary artery disease and history of diastolic heart failure: Compensated and stable.  On Toprol-XL,  Zocor and Lasix.  Hypokalemia: Replaced.  Resolved.   DVT prophylaxis: Lovenox and Coumadin Code Status: Full code Family Communication: Surgery communicated with patient's family Disposition Plan: Home after hospitalization.   Consultants:   Plastic surgery  Procedures:   Latissimus myocutaneous flap left chest wall  Antimicrobials:   Vancomycin  Cefepime, 01/19/2019   Subjective: Patient seen and examined.  Overnight events noted.  Patient had a temperature of 103.  She has some pain on the left chest wall.  No other overnight events.  She has no nausea vomiting.  Minimal cough but no sputum production.  Objective: Vitals:   01/21/19 0600 01/21/19 0740 01/21/19 0745 01/21/19 1143  BP:  103/67  93/75  Pulse:  87  92  Resp:  18  (!) 21  Temp: 99.4 F (37.4 C)   98 F (36.7 C)  TempSrc: Oral Oral  Oral  SpO2:  96% 96% 95%  Weight:      Height:        Intake/Output Summary (Last 24 hours) at 01/21/2019 1522 Last data filed at 01/21/2019 1200 Gross per 24 hour  Intake 1560 ml  Output 170 ml  Net 1390 ml   Filed Weights   01/18/19 1900  Weight: 77.6 kg    Examination:  General exam: Appears calm and comfortable, slightly anxious.  Chronically sick looking. Respiratory system: Clear to auscultation. Respiratory effort normal. Cardiovascular system: S1 & S2 heard, RRR. No JVD, murmurs, rubs, gallops or clicks. No pedal edema. Gastrointestinal system: Abdomen is nondistended, soft and nontender. No organomegaly or masses felt. Normal bowel sounds heard. Central nervous system: Alert and oriented. No focal neurological deficits. Extremities: Symmetric 5 x  5 power. Skin: No rashes, lesions or ulcers Psychiatry: Judgement and insight appear normal. Mood & affect appropriate.  Left chest wall: Patient has postsurgical dressing intact, she has some opening on the superior aspect of the wound with serous discharge, multiple JP drain with serosanguineous discharge,  subcutaneous swelling present.   Data Reviewed: I have personally reviewed following labs and imaging studies  CBC: Recent Labs  Lab 01/19/19 0155 01/19/19 1909 01/20/19 0038 01/21/19 0711  WBC 14.8* 23.3* 24.4* 20.4*  NEUTROABS  --   --  18.0*  --   HGB 11.4* 10.5* 9.1* 8.4*  HCT 37.1 32.8* 28.9* 26.5*  MCV 99.7 96.8 100.0 97.8  PLT 435* 471* 421* 962   Basic Metabolic Panel: Recent Labs  Lab 01/19/19 0155 01/20/19 0038 01/21/19 0602  NA 139 132* 134*  K 3.9 3.2* 4.1  CL 104 102 110  CO2 25 23 20*  GLUCOSE 115* 104* 114*  BUN 6* 8 8  CREATININE 0.76 0.79 0.73  CALCIUM 8.9 7.6* 7.7*   GFR: Estimated Creatinine Clearance: 68.3 mL/min (by C-G formula based on SCr of 0.73 mg/dL). Liver Function Tests: Recent Labs  Lab 01/20/19 0038  AST 24  ALT 13  ALKPHOS 59  BILITOT 0.7  PROT 5.6*  ALBUMIN 2.2*   No results for input(s): LIPASE, AMYLASE in the last 168 hours. No results for input(s): AMMONIA in the last 168 hours. Coagulation Profile: Recent Labs  Lab 01/19/19 0155 01/20/19 0038 01/21/19 0602  INR 1.1 1.5* 1.4*   Cardiac Enzymes: No results for input(s): CKTOTAL, CKMB, CKMBINDEX, TROPONINI in the last 168 hours. BNP (last 3 results) No results for input(s): PROBNP in the last 8760 hours. HbA1C: No results for input(s): HGBA1C in the last 72 hours. CBG: No results for input(s): GLUCAP in the last 168 hours. Lipid Profile: No results for input(s): CHOL, HDL, LDLCALC, TRIG, CHOLHDL, LDLDIRECT in the last 72 hours. Thyroid Function Tests: No results for input(s): TSH, T4TOTAL, FREET4, T3FREE, THYROIDAB in the last 72 hours. Anemia Panel: No results for input(s): VITAMINB12, FOLATE, FERRITIN, TIBC, IRON, RETICCTPCT in the last 72 hours. Sepsis Labs: Recent Labs  Lab 01/19/19 1909 01/19/19 2141  LATICACIDVEN 1.8 1.6    Recent Results (from the past 240 hour(s))  SARS Coronavirus 2 (Performed in [employer] hospital lab)     Status: None    Collection Time: 01/16/19 10:12 AM   Specimen: Nasal Swab  Result Value Ref Range Status   SARS Coronavirus 2 NEGATIVE NEGATIVE Final    Comment: (NOTE) SARS-CoV-2 target nucleic acids are NOT DETECTED. The SARS-CoV-2 RNA is generally detectable in upper and lower respiratory specimens during the acute phase of infection. Negative results do not preclude SARS-CoV-2 infection, do not rule out co-infections with other pathogens, and should not be used as the sole basis for treatment or other patient management decisions. Negative results must be combined with clinical observations, patient history, and epidemiological information. The expected result is Negative. Fact Sheet for Patients: SugarRoll.be Fact Sheet for Healthcare Providers: https://www.woods-mathews.com/ This test is not yet approved or cleared by the [place] FDA and  has been authorized for detection and/or diagnosis of SARS-CoV-2 by FDA under an Emergency Use Authorization (EUA). This EUA will remain  in effect (meaning this test can be used) for the duration of the COVID-19 declaration under Section 56 4(b)(1) of the Act, 21 U.S.C. section 360bbb-3(b)(1), unless the authorization is terminated or revoked sooner. Performed at [facility], [place]  [address] [place], [place] [address]   Culture, blood (routine x 2)     Status: None (Preliminary result)   Collection Time: 01/19/19  9:41 PM   Specimen: BLOOD  Result Value Ref Range Status   Specimen Description BLOOD RIGHT WRIST  Final   Special Requests   Final    BOTTLES DRAWN AEROBIC ONLY Blood Culture adequate volume   Culture   Final    NO GROWTH 2 DAYS Performed at [facility], [address], [place], [place] [address]    Report Status PENDING  Incomplete  Culture, blood (routine x 2)     Status: None (Preliminary result)   Collection Time: 01/19/19  9:41 PM   Specimen: BLOOD  Result Value Ref Range  Status   Specimen Description BLOOD RIGHT HAND  Final   Special Requests   Final    BOTTLES DRAWN AEROBIC ONLY Blood Culture adequate volume   Culture   Final    NO GROWTH 2 DAYS Performed at [facility], [place] [address], [place], [place] [address]    Report Status PENDING  Incomplete         Radiology Studies: Ct Angio Chest Pe W Or Wo Contrast  Result Date: 01/20/2019 CLINICAL DATA:  Tachycardia with fever EXAM: CT ANGIOGRAPHY CHEST WITH CONTRAST TECHNIQUE: Multidetector CT imaging of the chest was performed using the standard protocol during bolus administration of intravenous contrast. Multiplanar CT image reconstructions and MIPs were obtained to evaluate the vascular anatomy. CONTRAST:  51mL OMNIPAQUE IOHEXOL 350 MG/ML SOLN COMPARISON:  Radiograph 01/19/2019, CT chest 10/07/2018, 06/08/2018 FINDINGS: Cardiovascular: Satisfactory opacification of the pulmonary arteries to the segmental level. No evidence of pulmonary embolism. Mild aortic atherosclerosis. Mitral valve prosthesis. Post sternotomy changes. Ectatic aorta without aneurysm. Slightly enlarged pulmonary trunk. Coronary vascular calcification. Marked cardiomegaly. No pericardial effusion. Mediastinum/Nodes: Midline trachea. No thyroid mass. Right axillary lymph nodes measuring up to 13 mm. Slight increased mediastinal adenopathy. Right paratracheal lymph node measures 13 mm. Precarinal lymph node measures 2 cm compared with 16 mm previously. Esophagus within normal limits. Lungs/Pleura: Left apical fibrosis. 6 mm right apical lung nodule, series 7, image number 21, no change. Subpleural fibrosis with calcification in the left upper lobe anteriorly, no change. Emphysematous disease. Diffuse bilateral hazy lung density. Trace pleural effusion. Upper Abdomen: No acute abnormality. Musculoskeletal: Post mastectomy changes on the left. Drainage catheter within the left lower anterior chest wall. Extensive edema and soft tissue  inflammatory change throughout the left chest wall. This extends to the presternal region. Multiple gas and fluid collections within the left chest wall soft tissues anteriorly. Underlying rib irregularity and sclerosis on the left, no change. Review of the MIP images confirms the above findings. IMPRESSION: 1. Negative for acute pulmonary embolus 2. Post mastectomy changes on the left. Extensive subcutaneous edema and inflammatory change involving the left chest wall anteriorly and laterally, extending to the presternal soft tissues, consistent with soft tissue infection. Numerous gas and fluid collections within the left anterior chest wall soft tissues, concerning for soft tissue abscess or necrosis. 3. Cardiomegaly. Underlying emphysematous disease. Diffuse hazy attenuation throughout the lung fields, could reflect mild edema 4. Increased right axillary and mediastinal adenopathy, possibly reactive 5. Left upper lobe anterior and apical fibrosis 6. No change in 6 mm right apical lung nodule Aortic Atherosclerosis (ICD10-I70.0) and Emphysema (ICD10-J43.9). Electronically Signed   By: Donavan Foil M.D.   On: 01/20/2019 00:50   Dg Chest Port 1 View  Result Date:  01/19/2019 CLINICAL DATA:  Fever EXAM: PORTABLE CHEST 1 VIEW COMPARISON:  01/07/2019, 04/11/2018, 11/25/2017 FINDINGS: Post sternotomy changes with valve prosthesis. Emphysematous disease. Cardiomegaly with vascular congestion. Mild diffuse interstitial and ground-glass opacity, felt consistent with chronic interstitial change. Clips in the left axilla. No pneumothorax. Catheters or leads over the left mid and lower chest. Opacity at the left apex, felt to correspond to fibrosis noted on CT. IMPRESSION: 1. Cardiomegaly with central vascular congestion. Emphysematous disease 2. Mild diffuse bilateral interstitial opacity, likely chronic change. No acute superimposed airspace disease. Electronically Signed   By: Donavan Foil M.D.   On: 01/19/2019 21:00     Vas [place] Upper Extremity Venous Duplex  Result Date: 01/21/2019 UPPER VENOUS STUDY  Indications: Edema, Swelling, pain in chest wall, and History of left breast cancer, status post surgery with chronic wound Risk Factors: Cancer breast. Comparison Study: Prior negative left upper extremity venous duplex done 01/07/19                   and is available for comparison. Performing Technologist: Sharion Dove RVS  Examination Guidelines: A complete evaluation includes B-mode imaging, spectral Doppler, color Doppler, and power Doppler as needed of all accessible portions of each vessel. Bilateral testing is considered an integral part of a complete examination. Limited examinations for reoccurring indications may be performed as noted.  Right Findings: +----------+------------+---------+-----------+----------+-------+  RIGHT      Compressible Phasicity Spontaneous Properties Summary  +----------+------------+---------+-----------+----------+-------+  Subclavian                 Yes        Yes                         +----------+------------+---------+-----------+----------+-------+  Left Findings: +----------+------------+---------+-----------+----------+-------+  LEFT       Compressible Phasicity Spontaneous Properties Summary  +----------+------------+---------+-----------+----------+-------+  IJV            Full        Yes        Yes                         +----------+------------+---------+-----------+----------+-------+  Subclavian                 Yes        Yes                         +----------+------------+---------+-----------+----------+-------+  Axillary                   Yes        Yes                         +----------+------------+---------+-----------+----------+-------+  Brachial       Full        Yes        Yes                         +----------+------------+---------+-----------+----------+-------+  Radial         Full                                                +----------+------------+---------+-----------+----------+-------+  Ulnar  Full                                               +----------+------------+---------+-----------+----------+-------+  Cephalic       Full                                               +----------+------------+---------+-----------+----------+-------+  Basilic        Full                                               +----------+------------+---------+-----------+----------+-------+  Summary:  Right: No evidence of thrombosis in the subclavian.  Left: No evidence of deep vein thrombosis in the upper extremity. No evidence of superficial vein thrombosis in the upper extremity. No change since prior study done 01/07/19.  *See table(s) above for measurements and observations.    Preliminary         Scheduled Meds:  enoxaparin (LOVENOX) injection  80 mg Subcutaneous Q12H   furosemide  40 mg Oral Daily   gabapentin  600 mg Oral BID   metoprolol succinate  12.5 mg Oral QHS   senna  1 tablet Oral BID   simvastatin  20 mg Oral Daily   umeclidinium-vilanterol  1 puff Inhalation Daily   warfarin  5 mg Oral ONCE-1800   Warfarin - Pharmacist Dosing Inpatient   Does not apply q1800   Continuous Infusions:  ceFEPime (MAXIPIME) IV 2 g (01/21/19 1349)   vancomycin 1,500 mg (01/20/19 2237)     LOS: 3 days    Time spent: 25 minutes    Barb Merino, MD [facility] Pager [phone]  If 7PM-7AM, please contact night-coverage [email or web address] Password [email or web address] 01/21/2019, 3:22 PM

## 2019-01-21 NOTE — Progress Notes (Signed)
Patient has fever 103.1 and incision has completely opened up.Patient  left chest wall and left arm extending to neck red,warm and swollen.Patient complains pain 8/10 prn dose percocet given.Text paged NP X.Blount.New orders received.NP X.Blount asked this nurse to attempt to contact on call plastic surgery MD.

## 2019-01-21 NOTE — Evaluation (Signed)
Occupational Therapy Evaluation Patient Details Name: Kim Anthony [name]: [id number] DOB: 1951-01-02 Today's Date: 01/21/2019    History of Present Illness Pt is a 68 y.o. F with significant PMH of hypertension, CHF (EF 50-55%), h/o MVR, PE, L breast cancer s/p XRT with chronic wound who presented on 01/19/2019 for latissimus myocutaneous flap to reconstruct the left chest wall and developed sepsis.01/20/19 per RN noteincision has completely opened up.Patient  left chest wall and left arm extending to neck red,warm and swollen   Clinical Impression   [name] admitted and underwent above presents to acute OT with decreased follow through with donning and doffing of shirt instructions and mild decreased balance. She will benefit from acute OT without need for follow up.     Follow Up Recommendations  No OT follow up    Equipment Recommendations  None recommended by OT       Precautions / Restrictions Precautions Precautions: Fall Precaution Comments: 3 JP drains Restrictions Weight Bearing Restrictions: No      Mobility Bed Mobility Overal bed mobility: Needs Assistance Bed Mobility: Sit to Supine       Sit to supine: Supervision      Transfers Overall transfer level: Needs assistance Equipment used: None Transfers: Sit to/from Stand Sit to Stand: Min guard              Balance Overall balance assessment: Needs assistance Sitting-balance support: Feet supported;No upper extremity supported Sitting balance-Leahy Scale: Good     Standing balance support: No upper extremity supported;During functional activity Standing balance-Leahy Scale: Fair                             ADL either performed or assessed with clinical judgement   ADL Overall ADL's : Needs assistance/impaired Eating/Feeding: Independent;Sitting   Grooming: Min guard;Standing   Upper Body Bathing: Set up;Supervision/ safety;Sitting   Lower Body Bathing: Min guard;Sit to/from  stand   Upper Body Dressing : Moderate assistance;Sitting Upper Body Dressing Details (indicate cue type and reason): VCs also for technique to protect left side surgery Lower Body Dressing: Min guard;Sit to/from stand   Toilet Transfer: Min guard;Ambulation   Toileting- Clothing Manipulation and Hygiene: Min guard;Sit to/from stand         General ADL Comments: Called and left a message for dtr [facility]) about what shirts would be best for pt and to donn/doff them no matter what she wore (button up v. pull over) also discussed hair washing -- needs to be done at kitchen sink or beauty shop (can't get incision wet at present)     Vision Patient Visual Report: No change from baseline              Pertinent Vitals/Pain Pain Assessment: Faces Faces Pain Scale: Hurts a little bit Pain Location: surgical site Pain Descriptors / Indicators: Operative site guarding Pain Intervention(s): Limited activity within patient's tolerance;Monitored during session;Repositioned;RN gave pain meds during session     Hand Dominance Right   Extremity/Trunk Assessment Upper Extremity Assessment LUE Deficits / Details: slighlty edematous distally but more so proximally. Pt's arm was wrapped in ace wrap but had been taken off due to dtr had dropped off Jobst compression garment. Helped pt donn compression garment but arm edema has so decreaesed now over all that upper part of garrment is loose. Re-wrapped distally to proximally with ace wrap. Has good AROM elbow distally but had her only do minimal shoulder movement (  not more than 30 degrees FF and abd) due to per RN note surgical site had opened up.           Communication Communication Communication: No difficulties   Cognition Arousal/Alertness: Awake/alert Behavior During Therapy: WFL for tasks assessed/performed Overall Cognitive Status: Impaired/Different from baseline Area of Impairment: Problem solving                              Problem Solving: Requires verbal cues General Comments: Discussed with pt method of donning pullover shirt due to new surgery on left side, then got out her t-shirt for her to try it and she did not do it the way I had instructed--talked her through doing it correct way (left arm, head, right arm and reverse to doff)              Home Living Family/patient expects to be discharged to:: Private residence Living Arrangements: Children Available Help at Discharge: Family;Available 24 hours/day Type of Home: House Home Access: Stairs to enter [employer] of Steps: 4   Home Layout: One level     Bathroom Shower/Tub: Tub/shower unit         Home Equipment: None          Prior Functioning/Environment Level of Independence: Independent        Comments: Reports she is community ambulatory but has been "staying in with it being so hot."        OT Problem List: Decreased strength;Decreased range of motion;Impaired balance (sitting and/or standing);Impaired UE functional use;Pain      OT Treatment/Interventions: Self-care/ADL training;DME and/or AE instruction;Patient/family education;Balance training    OT Goals(Current goals can be found in the care plan section) Acute Rehab OT Goals Patient Stated Goal: to go home OT Goal Formulation: With patient Time For Goal Achievement: 02/04/19 Potential to Achieve Goals: Good  OT Frequency: Min 2X/week              AM-PAC OT "6 Clicks" Daily Activity     Outcome Measure Help from another person eating meals?: None Help from another person taking care of personal grooming?: A Little Help from another person toileting, which includes using toliet, bedpan, or urinal?: A Little Help from another person bathing (including washing, rinsing, drying)?: A Little Help from another person to put on and taking off regular upper body clothing?: A Lot Help from another person to put on and taking off regular lower body  clothing?: A Little 6 Click Score: 18   End of Session    Activity Tolerance: Patient tolerated treatment well Patient left: in bed;with call bell/phone within reach;with chair alarm set  OT Visit Diagnosis: Unsteadiness on feet (R26.81);Muscle weakness (generalized) (M62.81);Pain Pain - Right/Left: Left Pain - part of body: Arm                Time: 1359-1425 OT Time Calculation (min): 26 min Charges:  OT General Charges $OT Visit: 1 Visit OT Evaluation $OT Eval Moderate Complexity: 1 Mod OT Treatments $Self Care/Home Management : 8-22 mins  [address], OTR/L Acute [employer] Pager [phone] Office [phone]     Almon Register 01/21/2019, 2:43 PM

## 2019-01-22 LAB — CBC WITH DIFFERENTIAL/PLATELET
Abs Immature Granulocytes: 0.16 10*3/uL — ABNORMAL HIGH (ref 0.00–0.07)
Basophils Absolute: 0.1 10*3/uL (ref 0.0–0.1)
Basophils Relative: 1 %
Eosinophils Absolute: 0.3 10*3/uL (ref 0.0–0.5)
Eosinophils Relative: 2 %
HCT: 27.6 % — ABNORMAL LOW (ref 36.0–46.0)
Hemoglobin: 8.6 g/dL — ABNORMAL LOW (ref 12.0–15.0)
Immature Granulocytes: 1 %
Lymphocytes Relative: 13 %
Lymphs Abs: 1.9 10*3/uL (ref 0.7–4.0)
MCH: 30.5 pg (ref 26.0–34.0)
MCHC: 31.2 g/dL (ref 30.0–36.0)
MCV: 97.9 fL (ref 80.0–100.0)
Monocytes Absolute: 1.5 10*3/uL — ABNORMAL HIGH (ref 0.1–1.0)
Monocytes Relative: 10 %
Neutro Abs: 11.6 10*3/uL — ABNORMAL HIGH (ref 1.7–7.7)
Neutrophils Relative %: 73 %
Platelets: 412 10*3/uL — ABNORMAL HIGH (ref 150–400)
RBC: 2.82 MIL/uL — ABNORMAL LOW (ref 3.87–5.11)
RDW: 12.6 % (ref 11.5–15.5)
WBC: 15.5 10*3/uL — ABNORMAL HIGH (ref 4.0–10.5)
nRBC: 0 % (ref 0.0–0.2)

## 2019-01-22 LAB — BASIC METABOLIC PANEL
Anion gap: 13 (ref 5–15)
BUN: 9 mg/dL (ref 8–23)
CO2: 18 mmol/L — ABNORMAL LOW (ref 22–32)
Calcium: 8.5 mg/dL — ABNORMAL LOW (ref 8.9–10.3)
Chloride: 103 mmol/L (ref 98–111)
Creatinine, Ser: 0.63 mg/dL (ref 0.44–1.00)
GFR calc Af Amer: >60 mL/min (ref >60)
GFR calc non Af Amer: >60 mL/min (ref >60)
Glucose, Bld: 104 mg/dL — ABNORMAL HIGH (ref 70–99)
Potassium: 3.9 mmol/L (ref 3.5–5.1)
Sodium: 134 mmol/L — ABNORMAL LOW (ref 135–145)

## 2019-01-22 LAB — PROTIME-INR
INR: 1.4 — ABNORMAL HIGH (ref 0.8–1.2)
Prothrombin Time: 16.8 seconds — ABNORMAL HIGH (ref 11.4–15.2)

## 2019-01-22 LAB — VANCOMYCIN, TROUGH: Vancomycin Tr: 9 ug/mL — ABNORMAL LOW (ref 15–20)

## 2019-01-22 MED ORDER — WARFARIN SODIUM 5 MG PO TABS
10.0000 mg | ORAL_TABLET | Freq: Once | ORAL | Status: AC
  Administered 2019-01-22: 10 mg via ORAL
  Filled 2019-01-22: qty 2

## 2019-01-22 NOTE — Progress Notes (Signed)
4 Days Post-Op  Subjective: Kim Anthony resting in bedside recliner this AM on evaluation.   She is still in pain, but reports that she has been feeling better. She looks better as well.  She continues to have normal BM and urination. She has been walking with assistance to the bathroom. Eating and drinking normally.  No fevers overnight. No complaints other than pain. Still on vancomycin and cefepime.   Denies CP, SOB, Nausea, vomiting, diarrhea, fevers, chills. Denies abdominal pain. Objective: Vital signs in last 24 hours: Temp:  [98 F (36.7 C)-99.8 F (37.7 C)] 98.2 F (36.8 C) (08/02 0742) Pulse Rate:  [64-100] 64 (08/02 0742) Resp:  [18-21] 20 (08/01 2341) BP: (93-155)/(56-75) 150/71 (08/02 0742) SpO2:  [93 %-99 %] 93 % (08/02 0742) Last BM Date: (patient unsure of )  Intake/Output from previous day: 08/01 0701 - 08/02 0700 In: 2470 [P.O.:1680; I.V.:50; IV Piggyback:700] Out: 415 [Urine:260; Drains:155] Intake/Output this shift: No intake/output data recorded.   General appearance: alert, cooperative, no distress. Sitting up in bedside recliner. Head: Normocephalic, without obvious abnormality, atraumatic Eyes: Yellow sclera bilaterally, baseline Back: Left back wall bandage in place. Unsoiled.  Resp: CTAB, no respiratory distress, respiratory effort normal.   Cardio: s1, s2 noted. regular rate and rhythm. Murmur noted.  Chest wall: tender to palpation. some edema/induration noted along peri-wound area, extending superiorly. Erythema has improved since yesterdays exam. Less erythema noted inferiorly and medially, receding. Redness has also receded from the marked lines along the superior and lateral shoulder area.  Breasts: normal right breast. Left latissimus flap incision has dehisced at the superior incision ~ 3cm x 9cm. No purulent drainage. Bandages soiled with blood.  Inferior incisions intact. Latissimus flap with good capillary refill.  MSK: limited range of motion  of left arm.  Extremities: extremities normal, atraumatic, no cyanosis or edema Pulses: 2+ and symmetric Psych: Judgement and Affect normal. Mood normal.  Lab Results:  CBC    Component Value Date/Time   WBC 15.5 (H) 01/22/2019 0745   RBC 2.82 (L) 01/22/2019 0745   HGB 8.6 (L) 01/22/2019 0745   HGB 12.5 04/21/2018 1624   HCT 27.6 (L) 01/22/2019 0745   HCT 36.0 04/21/2018 1624   PLT 412 (H) 01/22/2019 0745   PLT 403 04/21/2018 1624   MCV 97.9 01/22/2019 0745   MCV 94 04/21/2018 1624   MCH 30.5 01/22/2019 0745   MCHC 31.2 01/22/2019 0745   RDW 12.6 01/22/2019 0745   RDW 11.4 (L) 04/21/2018 1624   LYMPHSABS 1.9 01/22/2019 0745   LYMPHSABS 3.0 04/21/2018 1624   MONOABS 1.5 (H) 01/22/2019 0745   EOSABS 0.3 01/22/2019 0745   EOSABS 0.2 04/21/2018 1624   BASOSABS 0.1 01/22/2019 0745   BASOSABS 0.1 04/21/2018 1624    BMET Recent Labs    01/21/19 0602 01/22/19 0745  NA 134* 134*  K 4.1 3.9  CL 110 103  CO2 20* 18*  GLUCOSE 114* 104*  BUN 8 9  CREATININE 0.73 0.63  CALCIUM 7.7* 8.5*   PT/INR Recent Labs    01/21/19 0602 01/22/19 0745  LABPROT 17.1* 16.8*  INR 1.4* 1.4*   ABG No results for input(s): PHART, HCO3 in the last 72 hours.  Invalid input(s): PCO2, PO2  Studies/Results: Vas [place] Upper Extremity Venous Duplex  Result Date: 01/22/2019 UPPER VENOUS STUDY  Indications: Edema, Swelling, pain in chest wall, and History of left breast cancer, status post surgery with chronic wound Risk Factors: Cancer breast. Comparison Study: Prior negative left  upper extremity venous duplex done 01/07/19                   and is available for comparison. Performing Technologist: Sharion Dove RVS  Examination Guidelines: A complete evaluation includes B-mode imaging, spectral Doppler, color Doppler, and power Doppler as needed of all accessible portions of each vessel. Bilateral testing is considered an integral part of a complete examination. Limited examinations for reoccurring  indications may be performed as noted.  Right Findings: +----------+------------+---------+-----------+----------+-------+ RIGHT     CompressiblePhasicitySpontaneousPropertiesSummary +----------+------------+---------+-----------+----------+-------+ Subclavian               Yes       Yes                      +----------+------------+---------+-----------+----------+-------+  Left Findings: +----------+------------+---------+-----------+----------+-------+ LEFT      CompressiblePhasicitySpontaneousPropertiesSummary +----------+------------+---------+-----------+----------+-------+ IJV           Full       Yes       Yes                      +----------+------------+---------+-----------+----------+-------+ Subclavian               Yes       Yes                      +----------+------------+---------+-----------+----------+-------+ Axillary                 Yes       Yes                      +----------+------------+---------+-----------+----------+-------+ Brachial      Full       Yes       Yes                      +----------+------------+---------+-----------+----------+-------+ Radial        Full                                          +----------+------------+---------+-----------+----------+-------+ Ulnar         Full                                          +----------+------------+---------+-----------+----------+-------+ Cephalic      Full                                          +----------+------------+---------+-----------+----------+-------+ Basilic       Full                                          +----------+------------+---------+-----------+----------+-------+  Summary:  Right: No evidence of thrombosis in the subclavian.  Left: No evidence of deep vein thrombosis in the upper extremity. No evidence of superficial vein thrombosis in the upper extremity. No change since prior study done 01/07/19.  *See table(s) above for  measurements and observations.  Diagnosing physician: Curt Jews MD Electronically signed by Curt Jews MD on 01/22/2019 at 8:05:53 AM.  Final     Anti-infectives: Anti-infectives (From admission, onward)   Start     Dose/Rate Route Frequency Ordered Stop   01/19/19 2200  ceFEPIme (MAXIPIME) 2 g in sodium chloride 0.9 % 100 mL IVPB     2 g 200 mL/hr over 30 Minutes Intravenous Every 8 hours 01/19/19 2120     01/19/19 2200  vancomycin (VANCOCIN) 1,500 mg in sodium chloride 0.9 % 500 mL IVPB     1,500 mg 250 mL/hr over 120 Minutes Intravenous Every 24 hours 01/19/19 2123     01/19/19 1900  sulfamethoxazole-trimethoprim (BACTRIM) 291.04 mg in dextrose 5 % 250 mL IVPB  Status:  Discontinued     15 mg/kg/day  77.6 kg 268.2 mL/hr over 60 Minutes Intravenous Every 6 hours 01/19/19 1824 01/19/19 2054   01/18/19 2200  ceFAZolin (ANCEF) IVPB 2g/100 mL premix  Status:  Discontinued     2 g 200 mL/hr over 30 Minutes Intravenous Every 8 hours 01/18/19 1804 01/19/19 2042   01/18/19 1155  ceFAZolin (ANCEF) 2-4 GM/100ML-% IVPB    Note to Pharmacy: Merryl Hacker   : cabinet override      01/18/19 1155 01/18/19 2359      Assessment/Plan: s/p Procedure(s): LEFT LATISSIMUS FLAP TO BREAST  Reactive tissue likely due to radiation and will continue to monitor. Incision partial opening not unexpected. It is slightly more open today than yesterday, approximately 3x9cm. Continue to allow the wound, peri-wound area to recover from reactive tissue/radiation damage and will plan for revision/wound care for healing.   WBC 15.5 this AM. Down from 20 yesterday. Currently on vancomycin and cefepime and being managed by medicine. Appreciate their help. Spoke with Dr. Sloan Leiter, plan is to continue IV abx for total of 7 days and then transition to PO.   Dressing changes BID or as needed based on soil level. Nursing to apply xeroform BID to dehisced incision, 4x4 gauze and ABD pad. Appreciate nursing assistance with  monitoring and dressing changes.  Drains still in place, drain output over last day ~ 155. Improving. May be inaccurate due to drainage also coming from open wound.   Appreciate assistance from medical team and pharmacy.Spoke with daughter and updated.  Call with any questions. Cell phone is [phone].    LOS: 4 days   Charlies Constable, PA-C 01/22/2019

## 2019-01-22 NOTE — Progress Notes (Signed)
PROGRESS NOTE    Kim Anthony  DZH:[id number] DOB: [birth year] DOA: 01/18/2019 PCP: Marianna Payment, MD    Brief Narrative:  68 year old female with hypertension, hyperlipidemia, chronic diastolic heart failure, history of MVR with mechanical valve and PE on Coumadin, COPD, history of left breast cancer status post radiation therapy with chronic wound who underwent left latissimus myocutaneous flap to reconstruct the chest wall by plastic surgery on 01/19/2019.  Postoperatively patient developed temperature of 103, tachycardia and hypoxia and she was transferred to 4 N. surgical unit for admission and treatment.  Patient also has some cough with yellowish sputum.  No other complaints.  She has postop site with swelling and edema, seen by plastic surgery.  Remains on vancomycin and cefepime.  Cultures are negative so far.   Assessment & Plan:   Principal Problem:   Fever Active Problems:   Open wound of chest wall, left, initial encounter   Sepsis ([place])   Tachycardia  Sepsis secondary to left chest wall cellulitis/subcutaneous collection: Sepsis present on admission. There is significant soft tissue swelling and inflammation along the subcutaneous area.  Seen and followed by surgery.  Patient remains on broad-spectrum antibiotics with vancomycin and cefepime.   Blood cultures negative so far.  Surgery is not anticipating any surgical debridement, will continue IV antibiotics until clinical improvement and probably treat with oral antibiotics for 2 to 3 weeks.  Left upper extremity swelling: Negative DVT.  Mechanical mitral valve and history of PE: Resumed on Coumadin.  Remains on therapeutic Lovenox for bridging.  Acute on chronic anemia of chronic disease: As anticipated from surgical blood loss.  We will continue to monitor levels.  No indication for transfusion at this time.  Coronary artery disease and history of diastolic heart failure: Compensated and stable.  On Toprol-XL, Zocor and  Lasix.  Hypokalemia: Replaced.  Resolved.   DVT prophylaxis: Lovenox and Coumadin Code Status: Full code Family Communication: Surgery communicated with patient's daughter. Disposition Plan: Home after hospitalization.   Consultants:   Plastic surgery  Procedures:   Latissimus myocutaneous flap left chest wall  Antimicrobials:   Vancomycin, 01/19/2019---  Cefepime, 01/19/2019---   Subjective: Patient seen and examined.  She does have pain on her left chest wall surgical site.  T-max 100.  WBC coming down.  She is trying to ambulate and become active.  Objective: Vitals:   01/21/19 2300 01/21/19 2341 01/22/19 0742 01/22/19 1209  BP: (!) 108/56 (!) 95/58 (!) 150/71 (!) 136/116  Pulse: 95 91 64 (!) 104  Resp:  20  20  Temp:  98.2 F (36.8 C) 98.2 F (36.8 C) 100 F (37.8 C)  TempSrc:  Oral Oral   SpO2:  99% 93% 98%  Weight:      Height:        Intake/Output Summary (Last 24 hours) at 01/22/2019 1309 Last data filed at 01/22/2019 1100 Gross per 24 hour  Intake 2186.5 ml  Output 1015 ml  Net 1171.5 ml   Filed Weights   01/18/19 1900  Weight: 77.6 kg    Examination:  General exam: Appears calm and comfortable, sitting in couch.  Not in any distress.  Chronically sick looking. Respiratory system: Clear to auscultation. Respiratory effort normal. Cardiovascular system: S1 & S2 heard, RRR. No JVD, murmurs, rubs, gallops or clicks. No pedal edema. Gastrointestinal system: Abdomen is nondistended, soft and nontender. No organomegaly or masses felt. Normal bowel sounds heard. Central nervous system: Alert and oriented. No focal neurological deficits. Extremities: Symmetric 5 x 5  power. Skin: No rashes, lesions or ulcers Psychiatry: Judgement and insight appear normal. Mood & affect appropriate.  Surgical dressing removed and wound inspected along with surgical team.  Picture taken and uploaded to haiku.     Left chest wall: Open surgical wound with serous  drainage, edema and induration surrounding tissue regressing in size from marked site.  Multiple JP drain with serosanguineous discharge.  No obvious purulence or underlying collection.  Data Reviewed: I have personally reviewed following labs and imaging studies  CBC: Recent Labs  Lab 01/19/19 0155 01/19/19 1909 01/20/19 0038 01/21/19 0711 01/22/19 0745  WBC 14.8* 23.3* 24.4* 20.4* 15.5*  NEUTROABS  --   --  18.0*  --  11.6*  HGB 11.4* 10.5* 9.1* 8.4* 8.6*  HCT 37.1 32.8* 28.9* 26.5* 27.6*  MCV 99.7 96.8 100.0 97.8 97.9  PLT 435* 471* 421* 354 315*   Basic Metabolic Panel: Recent Labs  Lab 01/19/19 0155 01/20/19 0038 01/21/19 0602 01/22/19 0745  NA 139 132* 134* 134*  K 3.9 3.2* 4.1 3.9  CL 104 102 110 103  CO2 25 23 20* 18*  GLUCOSE 115* 104* 114* 104*  BUN 6* 8 8 9   CREATININE 0.76 0.79 0.73 0.63  CALCIUM 8.9 7.6* 7.7* 8.5*   GFR: Estimated Creatinine Clearance: 68.3 mL/min (by C-G formula based on SCr of 0.63 mg/dL). Liver Function Tests: Recent Labs  Lab 01/20/19 0038  AST 24  ALT 13  ALKPHOS 59  BILITOT 0.7  PROT 5.6*  ALBUMIN 2.2*   No results for input(s): LIPASE, AMYLASE in the last 168 hours. No results for input(s): AMMONIA in the last 168 hours. Coagulation Profile: Recent Labs  Lab 01/19/19 0155 01/20/19 0038 01/21/19 0602 01/22/19 0745  INR 1.1 1.5* 1.4* 1.4*   Cardiac Enzymes: No results for input(s): CKTOTAL, CKMB, CKMBINDEX, TROPONINI in the last 168 hours. BNP (last 3 results) No results for input(s): PROBNP in the last 8760 hours. HbA1C: No results for input(s): HGBA1C in the last 72 hours. CBG: No results for input(s): GLUCAP in the last 168 hours. Lipid Profile: No results for input(s): CHOL, HDL, LDLCALC, TRIG, CHOLHDL, LDLDIRECT in the last 72 hours. Thyroid Function Tests: No results for input(s): TSH, T4TOTAL, FREET4, T3FREE, THYROIDAB in the last 72 hours. Anemia Panel: No results for input(s): VITAMINB12, FOLATE,  FERRITIN, TIBC, IRON, RETICCTPCT in the last 72 hours. Sepsis Labs: Recent Labs  Lab 01/19/19 1909 01/19/19 2141  LATICACIDVEN 1.8 1.6    Recent Results (from the past 240 hour(s))  SARS Coronavirus 2 (Performed in [place] hospital lab)     Status: None   Collection Time: 01/16/19 10:12 AM   Specimen: Nasal Swab  Result Value Ref Range Status   SARS Coronavirus 2 NEGATIVE NEGATIVE Final    Comment: (NOTE) SARS-CoV-2 target nucleic acids are NOT DETECTED. The SARS-CoV-2 RNA is generally detectable in upper and lower respiratory specimens during the acute phase of infection. Negative results do not preclude SARS-CoV-2 infection, do not rule out co-infections with other pathogens, and should not be used as the sole basis for treatment or other patient management decisions. Negative results must be combined with clinical observations, patient history, and epidemiological information. The expected result is Negative. Fact Sheet for Patients: SugarRoll.be Fact Sheet for Healthcare Providers: https://www.woods-mathews.com/ This test is not yet approved or cleared by the [place] FDA and  has been authorized for detection and/or diagnosis of SARS-CoV-2 by FDA under an Emergency Use Authorization (EUA). This EUA will remain  in effect (meaning this test can be used) for the duration of the COVID-19 declaration under Section 56 4(b)(1) of the Act, 21 U.S.C. section 360bbb-3(b)(1), unless the authorization is terminated or revoked sooner. Performed at [facility], [place] [address] [place], [place] [address]   Culture, blood (routine x 2)     Status: None (Preliminary result)   Collection Time: 01/19/19  9:41 PM   Specimen: BLOOD  Result Value Ref Range Status   Specimen Description BLOOD RIGHT WRIST  Final   Special Requests   Final    BOTTLES DRAWN AEROBIC ONLY Blood Culture adequate volume   Culture   Final    NO GROWTH 3  DAYS Performed at [facility], [address], [place], [place] [address]    Report Status PENDING  Incomplete  Culture, blood (routine x 2)     Status: None (Preliminary result)   Collection Time: 01/19/19  9:41 PM   Specimen: BLOOD  Result Value Ref Range Status   Specimen Description BLOOD RIGHT HAND  Final   Special Requests   Final    BOTTLES DRAWN AEROBIC ONLY Blood Culture adequate volume   Culture   Final    NO GROWTH 3 DAYS Performed at [facility], [place] [address] [place], [place] [address]    Report Status PENDING  Incomplete         Radiology Studies: Vas [place] Upper Extremity Venous Duplex  Result Date: 01/22/2019 UPPER VENOUS STUDY  Indications: Edema, Swelling, pain in chest wall, and History of left breast cancer, status post surgery with chronic wound Risk Factors: Cancer breast. Comparison Study: Prior negative left upper extremity venous duplex done 01/07/19                   and is available for comparison. Performing Technologist: Sharion Dove RVS  Examination Guidelines: A complete evaluation includes B-mode imaging, spectral Doppler, color Doppler, and power Doppler as needed of all accessible portions of each vessel. Bilateral testing is considered an integral part of a complete examination. Limited examinations for reoccurring indications may be performed as noted.  Right Findings: +----------+------------+---------+-----------+----------+-------+ RIGHT     CompressiblePhasicitySpontaneousPropertiesSummary +----------+------------+---------+-----------+----------+-------+ Subclavian               Yes       Yes                      +----------+------------+---------+-----------+----------+-------+  Left Findings: +----------+------------+---------+-----------+----------+-------+ LEFT      CompressiblePhasicitySpontaneousPropertiesSummary +----------+------------+---------+-----------+----------+-------+ IJV           Full       Yes        Yes                      +----------+------------+---------+-----------+----------+-------+ Subclavian               Yes       Yes                      +----------+------------+---------+-----------+----------+-------+ Axillary                 Yes       Yes                      +----------+------------+---------+-----------+----------+-------+ Brachial      Full       Yes       Yes                      +----------+------------+---------+-----------+----------+-------+  Radial        Full                                          +----------+------------+---------+-----------+----------+-------+ Ulnar         Full                                          +----------+------------+---------+-----------+----------+-------+ Cephalic      Full                                          +----------+------------+---------+-----------+----------+-------+ Basilic       Full                                          +----------+------------+---------+-----------+----------+-------+  Summary:  Right: No evidence of thrombosis in the subclavian.  Left: No evidence of deep vein thrombosis in the upper extremity. No evidence of superficial vein thrombosis in the upper extremity. No change since prior study done 01/07/19.  *See table(s) above for measurements and observations.  Diagnosing physician: Curt Jews MD Electronically signed by Curt Jews MD on 01/22/2019 at 8:05:53 AM.    Final         Scheduled Meds: . enoxaparin (LOVENOX) injection  80 mg Subcutaneous Q12H  . furosemide  40 mg Oral Daily  . gabapentin  600 mg Oral BID  . metoprolol succinate  12.5 mg Oral QHS  . senna  1 tablet Oral BID  . simvastatin  20 mg Oral Daily  . umeclidinium-vilanterol  1 puff Inhalation Daily  . warfarin  10 mg Oral ONCE-1800  . Warfarin - Pharmacist Dosing Inpatient   Does not apply q1800   Continuous Infusions: . ceFEPime (MAXIPIME) IV 2 g (01/22/19 0543)  . vancomycin  Stopped (01/22/19 0232)     LOS: 4 days    Time spent: 25 minutes    Barb Merino, MD [facility] Pager [phone]  If 7PM-7AM, please contact night-coverage [email or web address] Password [email or web address] 01/22/2019, 1:09 PM

## 2019-01-22 NOTE — Progress Notes (Signed)
ANTICOAGULATION CONSULT NOTE - Follow Up Consult  Pharmacy Consult for Coumadin Indication:  mech MV and chronic PE  Allergies  Allergen Reactions  . Acetaminophen-Codeine Itching  . Propoxyphene Itching    Darvocet  . Tramadol Itching and Nausea And Vomiting    Patient Measurements: Height: 5\' 5"  (165.1 cm) Weight: 171 lb (77.6 kg) IBW/kg (Calculated) : 57  Vital Signs: Temp: 98.2 F (36.8 C) (08/02 0742) Temp Source: Oral (08/02 0742) BP: 150/71 (08/02 0742) Pulse Rate: 64 (08/02 0742)  Labs: Recent Labs    01/19/19 2141 01/20/19 0038 01/21/19 0602 01/21/19 0711 01/22/19 0745  HGB  --  9.1*  --  8.4* 8.6*  HCT  --  28.9*  --  26.5* 27.6*  PLT  --  421*  --  354 412*  LABPROT  --  17.6* 17.1*  --  16.8*  INR  --  1.5* 1.4*  --  1.4*  CREATININE  --  0.79 0.73  --  0.63  TROPONINIHS 36* 34*  --   --   --     Estimated Creatinine Clearance: 68.3 mL/min (by C-G formula based on SCr of 0.63 mg/dL).   Assessment: Anticoag: Lovenox and Warfarin for hx mech MV and chronic PE.  Era Skeen manages [facility]. Has been off Warfarin, on Enox 120 mg sq q24hrs. INR 1.1. Hgb 11.4 down with post-op anemia.  PTA Warfarin: 5 mg daily except 7.5 mg on Wed and Sun. Last dose 7/23. INR was 3 that day. Has home INR monitor.  Lovenox 120 mg sq q24hrs begun 7/24>>7/27, then 60 mg on 7/28 at 8am  Today, INR 1.4 - unchanged, doses charted given. Remains on Vancomycin and Cefepime. H/H trended down but now low-stable at 8.6. Platelets are stable. No overt bleeding reported. Drains with serosanguineous fluid. No plans for wound I&D. Balancing bleed vs clot risk with chest wall wound and recent procedures. SCr stable.  Goal of Therapy:  INR 2.5-3.5 Monitor platelets by anticoagulation protocol: Yes   Plan:  Continue Lovenox 80mg /12h Warfarin 10 mg x 1 tonight. *Higher than home dose but INR not moving at all. Daily PT/INR, goal 2.5-3.5  Sloan Leiter, PharmD, BCPS, BCCCP Clinical  Pharmacist Please refer to [facility] for [place] numbers 01/22/2019,10:56 AM

## 2019-01-23 ENCOUNTER — Inpatient Hospital Stay (HOSPITAL_COMMUNITY): Payer: Medicare Other

## 2019-01-23 LAB — BASIC METABOLIC PANEL WITH GFR
Anion gap: 11 (ref 5–15)
BUN: 11 mg/dL (ref 8–23)
CO2: 19 mmol/L — ABNORMAL LOW (ref 22–32)
Calcium: 8.3 mg/dL — ABNORMAL LOW (ref 8.9–10.3)
Chloride: 102 mmol/L (ref 98–111)
Creatinine, Ser: 0.73 mg/dL (ref 0.44–1.00)
GFR calc Af Amer: >60 mL/min
GFR calc non Af Amer: >60 mL/min
Glucose, Bld: 117 mg/dL — ABNORMAL HIGH (ref 70–99)
Potassium: 3.6 mmol/L (ref 3.5–5.1)
Sodium: 132 mmol/L — ABNORMAL LOW (ref 135–145)

## 2019-01-23 LAB — CBC WITH DIFFERENTIAL/PLATELET
Abs Immature Granulocytes: 0.19 10*3/uL — ABNORMAL HIGH (ref 0.00–0.07)
Basophils Absolute: 0.1 10*3/uL (ref 0.0–0.1)
Basophils Relative: 1 %
Eosinophils Absolute: 0.3 10*3/uL (ref 0.0–0.5)
Eosinophils Relative: 2 %
HCT: 30 % — ABNORMAL LOW (ref 36.0–46.0)
Hemoglobin: 9.3 g/dL — ABNORMAL LOW (ref 12.0–15.0)
Immature Granulocytes: 2 %
Lymphocytes Relative: 16 %
Lymphs Abs: 2 10*3/uL (ref 0.7–4.0)
MCH: 30.2 pg (ref 26.0–34.0)
MCHC: 31 g/dL (ref 30.0–36.0)
MCV: 97.4 fL (ref 80.0–100.0)
Monocytes Absolute: 1.4 10*3/uL — ABNORMAL HIGH (ref 0.1–1.0)
Monocytes Relative: 11 %
Neutro Abs: 8.3 10*3/uL — ABNORMAL HIGH (ref 1.7–7.7)
Neutrophils Relative %: 68 %
Platelets: 369 10*3/uL (ref 150–400)
RBC: 3.08 MIL/uL — ABNORMAL LOW (ref 3.87–5.11)
RDW: 12.9 % (ref 11.5–15.5)
WBC: 12.2 10*3/uL — ABNORMAL HIGH (ref 4.0–10.5)
nRBC: 0 % (ref 0.0–0.2)

## 2019-01-23 LAB — PROTIME-INR
INR: 1.5 — ABNORMAL HIGH (ref 0.8–1.2)
Prothrombin Time: 18 seconds — ABNORMAL HIGH (ref 11.4–15.2)

## 2019-01-23 LAB — VANCOMYCIN, PEAK: Vancomycin Pk: 25 ug/mL — ABNORMAL LOW (ref 30–40)

## 2019-01-23 MED ORDER — DM-GUAIFENESIN ER 30-600 MG PO TB12
1.0000 | ORAL_TABLET | Freq: Two times a day (BID) | ORAL | Status: DC
  Administered 2019-01-23 – 2019-02-01 (×19): 1 via ORAL
  Filled 2019-01-23 (×20): qty 1

## 2019-01-23 MED ORDER — WARFARIN SODIUM 5 MG PO TABS
10.0000 mg | ORAL_TABLET | Freq: Once | ORAL | Status: AC
  Administered 2019-01-23: 19:00:00 10 mg via ORAL
  Filled 2019-01-23: qty 2

## 2019-01-23 MED ORDER — VANCOMYCIN HCL IN DEXTROSE 1-5 GM/200ML-% IV SOLN
1000.0000 mg | Freq: Two times a day (BID) | INTRAVENOUS | Status: DC
  Administered 2019-01-23 – 2019-01-27 (×8): 1000 mg via INTRAVENOUS
  Filled 2019-01-23 (×8): qty 200

## 2019-01-23 NOTE — Progress Notes (Signed)
Physical Therapy Treatment Patient Details Name: Kim Anthony [name]: [id number] DOB: 1951-05-06 Today's Date: 01/23/2019    History of Present Illness Pt is a 68 y.o. F with significant PMH of hypertension, CHF (EF 50-55%), h/o MVR, PE, L breast cancer s/p XRT with chronic wound who presented on 01/19/2019 for latissimus myocutaneous flap to reconstruct the left chest wall and developed sepsis.01/20/19 per RN noteincision has completely opened up.Patient  left chest wall and left arm extending to neck red,warm and swollen    PT Comments    Pt reported lots of pain at left chest wall, but agreed she needed to start walking.  Emphasis on gait stability and stamina.    Follow Up Recommendations  Home health PT;Supervision/Assistance - 24 hour     Equipment Recommendations  Cane    Recommendations for Other Services       Precautions / Restrictions Precautions Precautions: Fall Precaution Comments: 3 JP drains Restrictions Weight Bearing Restrictions: No    Mobility  Bed Mobility               General bed mobility comments: sitting EOB on arrival  Transfers Overall transfer level: Needs assistance Equipment used: None Transfers: Sit to/from Stand Sit to Stand: Min guard            Ambulation/Gait Ambulation/Gait assistance: Min Chemical engineer (Feet): 150 Feet Assistive device: IV Pole Gait Pattern/deviations: Step-through pattern Gait velocity: slower   General Gait Details: tentative gait, leaning moderately on R UE holding iv pole.  Notably fatigued.   Stairs             Wheelchair Mobility    Modified Rankin (Stroke Patients Only)       Balance Overall balance assessment: Needs assistance   Sitting balance-Leahy Scale: Good     Standing balance support: No upper extremity supported;During functional activity Standing balance-Leahy Scale: Fair Standing balance comment: reliant on external support                            Cognition Arousal/Alertness: Awake/alert Behavior During Therapy: WFL for tasks assessed/performed Overall Cognitive Status: (NT, followed commands well)                                        Exercises      General Comments        Pertinent Vitals/Pain Pain Assessment: Faces Faces Pain Scale: Hurts even more Pain Location: surgical site Pain Descriptors / Indicators: Operative site guarding Pain Intervention(s): Monitored during session;Patient requesting pain meds-RN notified    Home Living                      Prior Function            PT Goals (current goals can now be found in the care plan section) Acute Rehab PT Goals PT Goal Formulation: With patient Time For Goal Achievement: 02/03/19 Potential to Achieve Goals: Good Progress towards PT goals: Progressing toward goals    Frequency    Min 3X/week      PT Plan Current plan remains appropriate    Co-evaluation              AM-PAC PT "6 Clicks" Mobility   Outcome Measure  Help needed turning from your back to your side while in a flat bed without using  bedrails?: None Help needed moving from lying on your back to sitting on the side of a flat bed without using bedrails?: A Little Help needed moving to and from a bed to a chair (including a wheelchair)?: A Little Help needed standing up from a chair using your arms (e.g., wheelchair or bedside chair)?: A Little Help needed to walk in hospital room?: A Little Help needed climbing 3-5 steps with a railing? : A Little 6 Click Score: 19    End of Session   Activity Tolerance: Patient limited by pain;Patient limited by fatigue;Patient tolerated treatment well Patient left: in chair;with call bell/phone within reach;with chair alarm set Nurse Communication: Mobility status PT Visit Diagnosis: Unsteadiness on feet (R26.81);Pain;Difficulty in walking, not elsewhere classified (R26.2) Pain - Right/Left: Left Pain - part of  body: (chest wall)     Time: 2841-3244 PT Time Calculation (min) (ACUTE ONLY): 18 min  Charges:  $Gait Training: 8-22 mins                     01/23/2019  Kim Anthony, PT Acute Rehabilitation Services [phone]  (pager) [phone]  (office)   Kim Anthony 01/23/2019, 4:48 PM

## 2019-01-23 NOTE — Progress Notes (Signed)
ANTICOAGULATION CONSULT NOTE - Follow Up Consult  Pharmacy Consult for Coumadin Indication:  mech MV and chronic PE  Allergies  Allergen Reactions  . Acetaminophen-Codeine Itching  . Propoxyphene Itching    Darvocet  . Tramadol Itching and Nausea And Vomiting    Patient Measurements: Height: 5\' 5"  (165.1 cm) Weight: 171 lb (77.6 kg) IBW/kg (Calculated) : 57  Vital Signs: Temp: 102.3 F (39.1 C) (08/03 0852) Temp Source: Oral (08/03 0852) BP: 151/78 (08/03 0852) Pulse Rate: 113 (08/03 0852)  Labs: Recent Labs    01/21/19 0602  01/21/19 0711 01/22/19 0745 01/23/19 0402  HGB  --    < > 8.4* 8.6* 9.3*  HCT  --   --  26.5* 27.6* 30.0*  PLT  --   --  354 412* 369  LABPROT 17.1*  --   --  16.8* 18.0*  INR 1.4*  --   --  1.4* 1.5*  CREATININE 0.73  --   --  0.63 0.73   < > = values in this interval not displayed.    Estimated Creatinine Clearance: 68.3 mL/min (by C-G formula based on SCr of 0.73 mg/dL).   Assessment: Anticoag: Lovenox and Warfarin for hx mech MV and chronic PE.  Era Skeen manages [facility]. Has been off Warfarin, on Enox 120 mg sq q24hrs. INR 1.1. Hgb 11.4 down with post-op anemia.  PTA Warfarin: 5 mg daily except 7.5 mg on Wed and Sun. Last dose 7/23. INR was 3 that day. Has home INR monitor.  Lovenox 120 mg sq q24hrs begun 7/24>>7/27, then 60 mg on 7/28 at 8am  Today, INR 1.5 - slight increase, doses charted given. Remains on Vancomycin and Cefepime. H/H low-stable at 9.3. Platelets are stable. No overt bleeding reported. Drains with serosanguineous fluid. No plans for wound I&D. Balancing bleed vs clot risk with chest wall wound and recent procedures. SCr stable.  Goal of Therapy:  INR 2.5-3.5 Monitor platelets by anticoagulation protocol: Yes   Plan:  Continue Lovenox 80mg /12h Warfarin 10 mg x 1 tonight. *Higher than home dose but INR not moving. Daily PT/INR, goal 2.5-3.5  Alanda Slim, PharmD, [facility] Clinical Pharmacist Please see AMION for all  Pharmacists' Contact Phone Numbers 01/23/2019, 8:56 AM

## 2019-01-23 NOTE — Progress Notes (Signed)
Pharmacy Antibiotic Note  Kim Anthony is a 68 y.o. female admitted on 01/18/2019 for left latissimus flap to breast for chronic wound after excision of an areas of left breast / chest. Hx breast cancer, complete axillary node dissection and radiation.  POD#1.   Pharmacy has been consulted for Cefepime and Vancomycin dosing for sepsis coverage.   Vancomycin levels drawn this evening. Trough 9, Peak 25. Calculated AUC 382.4 (goal 400-550)  Plan:  Continue Cefepime 2gm IV q8hrs.  Change Vancomycin to 1000 mg IV Q 12 hrs.   Goal AUC 400-550.  Expected AUC: 509.8  SCr used: 0.8  Will follow renal function, culture data, and clinical progress.  Vancomcyin levels at steady state if Vanc to continue > 5 days.   Height: 5\' 5"  (165.1 cm) Weight: 171 lb (77.6 kg) IBW/kg (Calculated) : 57  Temp (24hrs), Avg:98.6 F (37 C), Min:98.1 F (36.7 C), Max:100 F (37.8 C)  Recent Labs  Lab 01/19/19 0155 01/19/19 1909 01/19/19 2141 01/20/19 0038 01/21/19 0602 01/21/19 0711 01/22/19 0745 01/22/19 2226 01/23/19 0402  WBC 14.8* 23.3*  --  24.4*  --  20.4* 15.5*  --  12.2*  CREATININE 0.76  --   --  0.79 0.73  --  0.63  --  0.73  LATICACIDVEN  --  1.8 1.6  --   --   --   --   --   --   VANCOTROUGH  --   --   --   --   --   --   --  9*  --   VANCOPEAK  --   --   --   --   --   --   --   --  25*    Estimated Creatinine Clearance: 68.3 mL/min (by C-G formula based on SCr of 0.73 mg/dL).    Allergies  Allergen Reactions  . Acetaminophen-Codeine Itching  . Propoxyphene Itching    Darvocet  . Tramadol Itching and Nausea And Vomiting    Antimicrobials this admission:  Cefazolin 7/29>>7/30   Septra IV x 1 on 7/30 at 8pm   Cefepime 7/30>>  Vancomycin 7/30>>  Dose adjustments this admission:  Change to 1000mg  IV q12h  Microbiology results:  7/30 blood x 2: sent  Kim Anthony A. Levada Dy, PharmD, BCPS, FNKF Clinical Pharmacist [place] Please utilize Amion for appropriate phone number to  reach the unit pharmacist ([place])

## 2019-01-23 NOTE — Care Management Important Message (Signed)
Important Message  Patient Details  Name: Kim Anthony [name]: [id number] Date of Birth: 1950-09-23   Medicare Important Message Given:  Yes     Shelda Altes 01/23/2019, 2:22 PM

## 2019-01-23 NOTE — Progress Notes (Signed)
5 Days Post-Op  Subjective: No acute overnight events.  This AM at 9,  temp of 102.3. Other than the pain, she has no complaints today.  Difficulty with BM recently, denies abdominal pain or bloating. + flatus. Urinating normally.  Denies CP, Nausea, vomiting, . No subjective fevers, chills during evaluation. Daughter, [name] coming to visit this afternoon.  Objective: Vital signs in last 24 hours: Temp:  [98.1 F (36.7 C)-102.3 F (39.1 C)] 102.3 F (39.1 C) (08/03 0852) Pulse Rate:  [89-113] 113 (08/03 0852) Resp:  [18-20] 18 (08/03 0852) BP: (93-151)/(59-116) 151/78 (08/03 0852) SpO2:  [86 %-98 %] 86 % (08/03 0852) Last BM Date: (patient unsure of )  Intake/Output from previous day: 08/02 0701 - 08/03 0700 In: 1101.5 [P.O.:240; IV Piggyback:776.5] Out: 1012 [Urine:952; Drains:60] Intake/Output this shift: Total I/O In: 240 [P.O.:240] Out: -   General appearance:alert, cooperative, no distress. Sitting up in bedside recliner. Ambulates by herself. Head:Normocephalic, without obvious abnormality, atraumatic Eyes:Yellow sclera bilaterally, baseline Back:Left back incision C/D/I. No drainage noted. No erythema. Bandage changed during evaluation. Resp:CTAB, no respiratory distress, respiratory effort normal.  Shallow breaths. Cardio:s1, s2 noted.regular rate and rhythm. Murmur noted. Chest wall:tender to palpation. some edema/induration notedalong peri-wound area, extending superiorly and laterally. Area of erythema/induration unchanged.   ~4cm area of fluctuance noted on left anterior shoulder.   Breasts:normal right breast. Left latissimus flap incision has dehisced at the superior incision ~ 3.5cm x 9cm. Bandages soiled with blood.  Inferior incisions intact. Latissimus flap with good capillary refill.Very tight skin over the anterior left chest. Dressing changed on evaluation GI: + bowel sounds. Mildly distended abdomen MSK: limited range of motion of left  arm.  Extremities:extremities normal, atraumatic, no cyanosis or edema Pulses:2+ and symmetric Psych: Judgement and Affect normal. Mood normal.   Lab Results:  CBC    Component Value Date/Time   WBC 12.2 (H) 01/23/2019 0402   RBC 3.08 (L) 01/23/2019 0402   HGB 9.3 (L) 01/23/2019 0402   HGB 12.5 04/21/2018 1624   HCT 30.0 (L) 01/23/2019 0402   HCT 36.0 04/21/2018 1624   PLT 369 01/23/2019 0402   PLT 403 04/21/2018 1624   MCV 97.4 01/23/2019 0402   MCV 94 04/21/2018 1624   MCH 30.2 01/23/2019 0402   MCHC 31.0 01/23/2019 0402   RDW 12.9 01/23/2019 0402   RDW 11.4 (L) 04/21/2018 1624   LYMPHSABS 2.0 01/23/2019 0402   LYMPHSABS 3.0 04/21/2018 1624   MONOABS 1.4 (H) 01/23/2019 0402   EOSABS 0.3 01/23/2019 0402   EOSABS 0.2 04/21/2018 1624   BASOSABS 0.1 01/23/2019 0402   BASOSABS 0.1 04/21/2018 1624   BMET Recent Labs    01/22/19 0745 01/23/19 0402  NA 134* 132*  K 3.9 3.6  CL 103 102  CO2 18* 19*  GLUCOSE 104* 117*  BUN 9 11  CREATININE 0.63 0.73  CALCIUM 8.5* 8.3*   PT/INR Recent Labs    01/22/19 0745 01/23/19 0402  LABPROT 16.8* 18.0*  INR 1.4* 1.5*   ABG No results for input(s): PHART, HCO3 in the last 72 hours.  Invalid input(s): PCO2, PO2  Studies/Results: No results found.  Anti-infectives: Anti-infectives (From admission, onward)   Start     Dose/Rate Route Frequency Ordered Stop   01/23/19 1800  vancomycin (VANCOCIN) IVPB 1000 mg/200 mL premix     1,000 mg 200 mL/hr over 60 Minutes Intravenous Every 12 hours 01/23/19 0541     01/19/19 2200  ceFEPIme (MAXIPIME) 2 g in sodium chloride  0.9 % 100 mL IVPB     2 g 200 mL/hr over 30 Minutes Intravenous Every 8 hours 01/19/19 2120     01/19/19 2200  vancomycin (VANCOCIN) 1,500 mg in sodium chloride 0.9 % 500 mL IVPB  Status:  Discontinued     1,500 mg 250 mL/hr over 120 Minutes Intravenous Every 24 hours 01/19/19 2123 01/23/19 0541   01/19/19 1900  sulfamethoxazole-trimethoprim (BACTRIM) 291.04  mg in dextrose 5 % 250 mL IVPB  Status:  Discontinued     15 mg/kg/day  77.6 kg 268.2 mL/hr over 60 Minutes Intravenous Every 6 hours 01/19/19 1824 01/19/19 2054   01/18/19 2200  ceFAZolin (ANCEF) IVPB 2g/100 mL premix  Status:  Discontinued     2 g 200 mL/hr over 30 Minutes Intravenous Every 8 hours 01/18/19 1804 01/19/19 2042   01/18/19 1155  ceFAZolin (ANCEF) 2-4 GM/100ML-% IVPB    Note to Pharmacy: Merryl Hacker   : cabinet override      01/18/19 1155 01/18/19 2359      Assessment/Plan: s/p Procedure(s): LEFT LATISSIMUS FLAP TO BREAST  Size of wound opening has progressed slightly, continue to monitor. Allow tissue reaction to diminish.   Continue withy dressing changes BID or as needed based on soil level. Nursing to apply xeroform BID to wound, 4x4 gauze and ABD pad. Changed dressings today during evaluation.  WBC 12.2 this AM. Down from 15.5 yesterday. Currently on vancomycin and cefepime and being managed by medicine. Appreciate their help.  Drains still in place, continue to monitor.  Appreciate assistance from medical team and pharmacy.    LOS: 5 days    Charlies Constable, PA-C 01/23/2019 [phone].

## 2019-01-23 NOTE — Progress Notes (Signed)
PROGRESS NOTE    Kim Anthony  EYC:[id number] DOB: 18-Jan-1951 DOA: 01/18/2019 PCP: Marianna Payment, MD    Brief Narrative:  68 year old female with hypertension, hyperlipidemia, chronic diastolic heart failure, history of MVR with mechanical valve and PE on Coumadin, COPD, history of left breast cancer status post radiation therapy with chronic wound who underwent left latissimus myocutaneous flap to reconstruct the chest wall by plastic surgery on 01/19/2019.  Postoperatively patient developed temperature of 103, tachycardia and hypoxia and she was transferred to 4 N. surgical unit for admission and treatment.  Patient also has some cough with yellowish sputum.  No other complaints.  She has postop site with swelling and edema, seen by plastic surgery.  Remains on vancomycin and cefepime.  Cultures are negative so far.  Persistently febrile, recultures done today 01/23/2019.  Assessment & Plan:   Principal Problem:   Fever Active Problems:   Open wound of chest wall, left, initial encounter   Sepsis ([place])   Tachycardia  Sepsis secondary to left chest wall cellulitis/subcutaneous collection: Sepsis present on admission. Significant swelling with subcutaneous collection on the postop site.  Incision opened and draining. Induration remains intact.  Fluctuation developing on the left upper chest wall. WBC improving, however remains febrile. Repeat blood cultures today.  Continue vancomycin and cefepime. Will defer to surgery to make decision whether patient needs reexploration of the wound or draining of the collection.  Left upper extremity swelling: Negative DVT.  Mechanical mitral valve and history of PE: Resumed on Coumadin.  Remains on therapeutic Lovenox for bridging.  Acute on chronic anemia of chronic disease: As anticipated from surgical blood loss.  We will continue to monitor levels.  No indication for transfusion at this time.  Coronary artery disease and history of diastolic  heart failure: Compensated and stable.  On Toprol-XL, Zocor and Lasix.  Hypokalemia: Replaced.  Resolved.   DVT prophylaxis: Lovenox and Coumadin Code Status: Full code Family Communication: Surgery communicated with patient's daughter. Disposition Plan: Home after hospitalization.   Consultants:   Plastic surgery  Procedures:   Latissimus myocutaneous flap left chest wall  Antimicrobials:   Vancomycin, 01/19/2019---  Cefepime, 01/19/2019---   Subjective: Patient seen and examined.  She does not have much complaints.  Able to take deep breathing.  She is somehow congested today. Temperature 10 2 in the morning.  Objective: Vitals:   01/22/19 2339 01/23/19 0400 01/23/19 0805 01/23/19 0852  BP:    (!) 151/78  Pulse:    (!) 113  Resp:    18  Temp: 98.1 F (36.7 C) 98.1 F (36.7 C)  (!) 102.3 F (39.1 C)  TempSrc: Oral Oral  Oral  SpO2:   90% (!) 86%  Weight:      Height:        Intake/Output Summary (Last 24 hours) at 01/23/2019 1332 Last data filed at 01/23/2019 0900 Gross per 24 hour  Intake 425 ml  Output 62 ml  Net 363 ml   Filed Weights   01/18/19 1900  Weight: 77.6 kg    Examination:  General exam: Appears calm and comfortable, sitting in couch.  Not in any distress.  Chronically sick looking. Respiratory system: Clear to auscultation. Respiratory effort normal. Cardiovascular system: S1 & S2 heard, RRR. No JVD, murmurs, rubs, gallops or clicks. No pedal edema. Gastrointestinal system: Abdomen is nondistended, soft and nontender. No organomegaly or masses felt. Normal bowel sounds heard. Central nervous system: Alert and oriented. No focal neurological deficits. Extremities: Symmetric 5 x 5  power. Skin: No rashes, lesions or ulcers Psychiatry: Judgement and insight appear normal. Mood & affect appropriate.   Open surgical wound with serous drainage, induration slightly regressing from previous original marking, however patient has developed a  fluctuant swelling on the anterior aspect of the left shoulder, around the lateral clavicle area.  Data Reviewed: I have personally reviewed following labs and imaging studies  CBC: Recent Labs  Lab 01/19/19 1909 01/20/19 0038 01/21/19 0711 01/22/19 0745 01/23/19 0402  WBC 23.3* 24.4* 20.4* 15.5* 12.2*  NEUTROABS  --  18.0*  --  11.6* 8.3*  HGB 10.5* 9.1* 8.4* 8.6* 9.3*  HCT 32.8* 28.9* 26.5* 27.6* 30.0*  MCV 96.8 100.0 97.8 97.9 97.4  PLT 471* 421* 354 412* 237   Basic Metabolic Panel: Recent Labs  Lab 01/19/19 0155 01/20/19 0038 01/21/19 0602 01/22/19 0745 01/23/19 0402  NA 139 132* 134* 134* 132*  K 3.9 3.2* 4.1 3.9 3.6  CL 104 102 110 103 102  CO2 25 23 20* 18* 19*  GLUCOSE 115* 104* 114* 104* 117*  BUN 6* 8 8 9 11   CREATININE 0.76 0.79 0.73 0.63 0.73  CALCIUM 8.9 7.6* 7.7* 8.5* 8.3*   GFR: Estimated Creatinine Clearance: 68.3 mL/min (by C-G formula based on SCr of 0.73 mg/dL). Liver Function Tests: Recent Labs  Lab 01/20/19 0038  AST 24  ALT 13  ALKPHOS 59  BILITOT 0.7  PROT 5.6*  ALBUMIN 2.2*   No results for input(s): LIPASE, AMYLASE in the last 168 hours. No results for input(s): AMMONIA in the last 168 hours. Coagulation Profile: Recent Labs  Lab 01/19/19 0155 01/20/19 0038 01/21/19 0602 01/22/19 0745 01/23/19 0402  INR 1.1 1.5* 1.4* 1.4* 1.5*   Cardiac Enzymes: No results for input(s): CKTOTAL, CKMB, CKMBINDEX, TROPONINI in the last 168 hours. BNP (last 3 results) No results for input(s): PROBNP in the last 8760 hours. HbA1C: No results for input(s): HGBA1C in the last 72 hours. CBG: No results for input(s): GLUCAP in the last 168 hours. Lipid Profile: No results for input(s): CHOL, HDL, LDLCALC, TRIG, CHOLHDL, LDLDIRECT in the last 72 hours. Thyroid Function Tests: No results for input(s): TSH, T4TOTAL, FREET4, T3FREE, THYROIDAB in the last 72 hours. Anemia Panel: No results for input(s): VITAMINB12, FOLATE, FERRITIN, TIBC, IRON,  RETICCTPCT in the last 72 hours. Sepsis Labs: Recent Labs  Lab 01/19/19 1909 01/19/19 2141  LATICACIDVEN 1.8 1.6    Recent Results (from the past 240 hour(s))  SARS Coronavirus 2 (Performed in [place] hospital lab)     Status: None   Collection Time: 01/16/19 10:12 AM   Specimen: Nasal Swab  Result Value Ref Range Status   SARS Coronavirus 2 NEGATIVE NEGATIVE Final    Comment: (NOTE) SARS-CoV-2 target nucleic acids are NOT DETECTED. The SARS-CoV-2 RNA is generally detectable in upper and lower respiratory specimens during the acute phase of infection. Negative results do not preclude SARS-CoV-2 infection, do not rule out co-infections with other pathogens, and should not be used as the sole basis for treatment or other patient management decisions. Negative results must be combined with clinical observations, patient history, and epidemiological information. The expected result is Negative. Fact Sheet for Patients: SugarRoll.be Fact Sheet for Healthcare Providers: https://www.woods-mathews.com/ This test is not yet approved or cleared by the [place] FDA and  has been authorized for detection and/or diagnosis of SARS-CoV-2 by FDA under an Emergency Use Authorization (EUA). This EUA will remain  in effect (meaning this test can be used) for the  duration of the COVID-19 declaration under Section 56 4(b)(1) of the Act, 21 U.S.C. section 360bbb-3(b)(1), unless the authorization is terminated or revoked sooner. Performed at [facility], [place] [address], [place], [place] [address]   Culture, blood (routine x 2)     Status: None (Preliminary result)   Collection Time: 01/19/19  9:41 PM   Specimen: BLOOD  Result Value Ref Range Status   Specimen Description BLOOD RIGHT WRIST  Final   Special Requests   Final    BOTTLES DRAWN AEROBIC ONLY Blood Culture adequate volume   Culture   Final    NO GROWTH 4 DAYS Performed at  [facility], [address], [place], [place] [address]    Report Status PENDING  Incomplete  Culture, blood (routine x 2)     Status: None (Preliminary result)   Collection Time: 01/19/19  9:41 PM   Specimen: BLOOD  Result Value Ref Range Status   Specimen Description BLOOD RIGHT HAND  Final   Special Requests   Final    BOTTLES DRAWN AEROBIC ONLY Blood Culture adequate volume   Culture   Final    NO GROWTH 4 DAYS Performed at [facility], [place] [address], [place], [place] [address]    Report Status PENDING  Incomplete         Radiology Studies: Dg Chest 2 View  Result Date: 01/23/2019 CLINICAL DATA:  Fever, cough and chest pain. EXAM: CHEST - 2 VIEW COMPARISON:  Single-view of the chest 01/19/2019, 01/07/2019. PA and lateral chest 11/24/2017 and 08/10/2017. CT chest 01/20/2019. FINDINGS: The lungs are emphysematous. No consolidative process, pneumothorax or effusion. There is cardiomegaly. Surgical clips left axilla are noted. There are air-fluid levels projecting in the left upper chest and left axilla correlating with findings on recent CT scan. Surgical drains in the anterior and posterior left chest are noted. IMPRESSION: Postoperative change left chest with surgical drains present. Air-fluid levels in the left upper chest and axilla correlate with fluid collections in the left chest seen on recent CT. Emphysema. Cardiomegaly. Electronically Signed   By: Inge Rise M.D.   On: 01/23/2019 11:03        Scheduled Meds: . dextromethorphan-guaiFENesin  1 tablet Oral BID  . enoxaparin (LOVENOX) injection  80 mg Subcutaneous Q12H  . furosemide  40 mg Oral Daily  . gabapentin  600 mg Oral BID  . metoprolol succinate  12.5 mg Oral QHS  . senna  1 tablet Oral BID  . simvastatin  20 mg Oral Daily  . umeclidinium-vilanterol  1 puff Inhalation Daily  . warfarin  10 mg Oral ONCE-1800  . Warfarin - Pharmacist Dosing Inpatient   Does not apply q1800   Continuous  Infusions: . ceFEPime (MAXIPIME) IV 2 g (01/23/19 0553)  . vancomycin       LOS: 5 days    Time spent: 25 minutes    Barb Merino, MD [facility] Pager [phone]  If 7PM-7AM, please contact night-coverage [email or web address] Password [email or web address] 01/23/2019, 1:32 PM

## 2019-01-24 LAB — PHOSPHORUS: Phosphorus: 2.4 mg/dL — ABNORMAL LOW (ref 2.5–4.6)

## 2019-01-24 LAB — EXPECTORATED SPUTUM ASSESSMENT W GRAM STAIN, RFLX TO RESP C

## 2019-01-24 LAB — CULTURE, BLOOD (ROUTINE X 2)
Culture: NO GROWTH
Culture: NO GROWTH
Special Requests: ADEQUATE
Special Requests: ADEQUATE

## 2019-01-24 LAB — BASIC METABOLIC PANEL
Anion gap: 12 (ref 5–15)
BUN: 9 mg/dL (ref 8–23)
CO2: 20 mmol/L — ABNORMAL LOW (ref 22–32)
Calcium: 8.5 mg/dL — ABNORMAL LOW (ref 8.9–10.3)
Chloride: 102 mmol/L (ref 98–111)
Creatinine, Ser: 0.76 mg/dL (ref 0.44–1.00)
GFR calc Af Amer: >60 mL/min (ref >60)
GFR calc non Af Amer: >60 mL/min (ref >60)
Glucose, Bld: 154 mg/dL — ABNORMAL HIGH (ref 70–99)
Potassium: 2.9 mmol/L — ABNORMAL LOW (ref 3.5–5.1)
Sodium: 134 mmol/L — ABNORMAL LOW (ref 135–145)

## 2019-01-24 LAB — CBC WITH DIFFERENTIAL/PLATELET
Abs Immature Granulocytes: 0.3 10*3/uL — ABNORMAL HIGH (ref 0.00–0.07)
Basophils Absolute: 0.1 10*3/uL (ref 0.0–0.1)
Basophils Relative: 1 %
Eosinophils Absolute: 0.2 10*3/uL (ref 0.0–0.5)
Eosinophils Relative: 2 %
HCT: 26 % — ABNORMAL LOW (ref 36.0–46.0)
Hemoglobin: 8.3 g/dL — ABNORMAL LOW (ref 12.0–15.0)
Immature Granulocytes: 2 %
Lymphocytes Relative: 12 %
Lymphs Abs: 1.6 10*3/uL (ref 0.7–4.0)
MCH: 30.5 pg (ref 26.0–34.0)
MCHC: 31.9 g/dL (ref 30.0–36.0)
MCV: 95.6 fL (ref 80.0–100.0)
Monocytes Absolute: 1.1 10*3/uL — ABNORMAL HIGH (ref 0.1–1.0)
Monocytes Relative: 8 %
Neutro Abs: 9.7 10*3/uL — ABNORMAL HIGH (ref 1.7–7.7)
Neutrophils Relative %: 75 %
Platelets: 480 10*3/uL — ABNORMAL HIGH (ref 150–400)
RBC: 2.72 MIL/uL — ABNORMAL LOW (ref 3.87–5.11)
RDW: 13 % (ref 11.5–15.5)
WBC: 12.9 10*3/uL — ABNORMAL HIGH (ref 4.0–10.5)
nRBC: 0 % (ref 0.0–0.2)

## 2019-01-24 LAB — PROTIME-INR
INR: 1.8 — ABNORMAL HIGH (ref 0.8–1.2)
Prothrombin Time: 20.6 seconds — ABNORMAL HIGH (ref 11.4–15.2)

## 2019-01-24 LAB — MAGNESIUM: Magnesium: 1.7 mg/dL (ref 1.7–2.4)

## 2019-01-24 MED ORDER — POTASSIUM CHLORIDE CRYS ER 20 MEQ PO TBCR
40.0000 meq | EXTENDED_RELEASE_TABLET | Freq: Two times a day (BID) | ORAL | Status: DC
  Administered 2019-01-24 – 2019-01-25 (×3): 40 meq via ORAL
  Filled 2019-01-24 (×3): qty 2

## 2019-01-24 MED ORDER — WARFARIN SODIUM 7.5 MG PO TABS
7.5000 mg | ORAL_TABLET | Freq: Once | ORAL | Status: AC
  Administered 2019-01-24: 7.5 mg via ORAL
  Filled 2019-01-24: qty 1

## 2019-01-24 MED ORDER — POTASSIUM & SODIUM PHOSPHATES 280-160-250 MG PO PACK
1.0000 | PACK | Freq: Three times a day (TID) | ORAL | Status: DC
  Administered 2019-01-24 – 2019-01-25 (×3): 1 via ORAL
  Filled 2019-01-24 (×6): qty 1

## 2019-01-24 NOTE — Progress Notes (Signed)
PROGRESS NOTE    Kim Anthony  VVO:[id number] DOB: June 27, 1950 DOA: 01/18/2019 PCP: Marianna Payment, MD    Brief Narrative:  68 year old female with hypertension, hyperlipidemia, chronic diastolic heart failure, history of MVR with mechanical valve and PE on Coumadin, COPD, history of left breast cancer status post radiation therapy with chronic wound who underwent left latissimus myocutaneous flap to reconstruct the chest wall by plastic surgery on 01/19/2019.  Postoperatively patient developed temperature of 103, tachycardia and hypoxia and she was transferred to 4 N. surgical unit for admission and treatment.  Patient also has some cough with yellowish sputum.  No other complaints.  She has postop site with swelling and edema, seen by plastic surgery.  Remains on vancomycin and cefepime.  Cultures are negative so far.  She had episodes of fever recurrent, 01/23/2019, cultures were done and are negative so far.   Assessment & Plan:   Principal Problem:   Fever Active Problems:   Open wound of chest wall, left, initial encounter   Sepsis ([place])   Tachycardia  Sepsis secondary to left chest wall cellulitis/subcutaneous collection: Sepsis present on admission. Significant swelling with subcutaneous collection on the postop site.  Incision site opened and draining. Few areas of fluctuation on the left upper chest wall. WBC improving.  Repeat blood cultures negative so far. Continue vancomycin and cefepime. Will defer to surgery to make decision whether patient needs reexploration of the wound or draining of the collection. Discussed with surgery, they do not anticipate surgical exploration. Will change to broad-spectrum oral antibiotics for 2 weeks once patient is afebrile for more than 24 hours.  Left upper extremity swelling: Negative DVT.  Mechanical mitral valve and history of PE: Resumed on Coumadin.  Remains on therapeutic Lovenox for bridging.  Acute on chronic anemia of chronic  disease: As anticipated from surgical blood loss.  We will continue to monitor levels.  No indication for transfusion at this time.  Coronary artery disease and history of diastolic heart failure: Compensated and stable.  On Toprol-XL, Zocor and Lasix.  Hypokalemia: Persistent.  Replace and monitor levels.  Magnesium was normal.  We will replace phosphorus also.   DVT prophylaxis: Lovenox and Coumadin Code Status: Full code Family Communication: Surgery communicated with patient's daughter. Disposition Plan: Home after hospitalization.   Consultants:   Plastic surgery  Procedures:   Latissimus myocutaneous flap left chest wall  Antimicrobials:   Vancomycin, 01/19/2019---  Cefepime, 01/19/2019---   Subjective: Patient seen and examined.  Has pain on the left shoulder.  Still on 1 or 2 L of oxygen.  Congestion has improved.  T-max 102.  Objective: Vitals:   01/24/19 0031 01/24/19 0556 01/24/19 0804 01/24/19 0804  BP: 110/62 139/73  (!) 115/59  Pulse: 99 98  97  Resp: 15 (!) 21  20  Temp: 98.3 F (36.8 C) 99.6 F (37.6 C)  98.6 F (37 C)  TempSrc: Oral Oral  Oral  SpO2: 91% 94% 94% 100%  Weight:      Height:        Intake/Output Summary (Last 24 hours) at 01/24/2019 1427 Last data filed at 01/24/2019 1100 Gross per 24 hour  Intake 2003.5 ml  Output 45 ml  Net 1958.5 ml   Filed Weights   01/18/19 1900  Weight: 77.6 kg    Examination:  General exam: Appears calm and comfortable, sitting in couch.  Not in any distress.  Chronically sick looking. Respiratory system: Clear to auscultation. Respiratory effort normal. Cardiovascular system: S1 & S2  heard, RRR. No JVD, murmurs, rubs, gallops or clicks. No pedal edema. Gastrointestinal system: Abdomen is nondistended, soft and nontender. No organomegaly or masses felt. Normal bowel sounds heard. Central nervous system: Alert and oriented. No focal neurological deficits. Extremities: Symmetric 5 x 5 power. Skin: No  rashes, lesions or ulcers Psychiatry: Judgement and insight appear normal. Mood & affect appropriate.   Open surgical wound with serous drainage, induration slightly regressing from previous original marking, however patient has developed a fluctuant swelling on the anterior aspect of the left shoulder, around the lateral clavicle area.  Data Reviewed: I have personally reviewed following labs and imaging studies  CBC: Recent Labs  Lab 01/20/19 0038 01/21/19 0711 01/22/19 0745 01/23/19 0402 01/24/19 0842  WBC 24.4* 20.4* 15.5* 12.2* 12.9*  NEUTROABS 18.0*  --  11.6* 8.3* 9.7*  HGB 9.1* 8.4* 8.6* 9.3* 8.3*  HCT 28.9* 26.5* 27.6* 30.0* 26.0*  MCV 100.0 97.8 97.9 97.4 95.6  PLT 421* 354 412* 369 387*   Basic Metabolic Panel: Recent Labs  Lab 01/20/19 0038 01/21/19 0602 01/22/19 0745 01/23/19 0402 01/24/19 0842  NA 132* 134* 134* 132* 134*  K 3.2* 4.1 3.9 3.6 2.9*  CL 102 110 103 102 102  CO2 23 20* 18* 19* 20*  GLUCOSE 104* 114* 104* 117* 154*  BUN 8 8 9 11 9   CREATININE 0.79 0.73 0.63 0.73 0.76  CALCIUM 7.6* 7.7* 8.5* 8.3* 8.5*  MG  --   --   --   --  1.7  PHOS  --   --   --   --  2.4*   GFR: Estimated Creatinine Clearance: 68.3 mL/min (by C-G formula based on SCr of 0.76 mg/dL). Liver Function Tests: Recent Labs  Lab 01/20/19 0038  AST 24  ALT 13  ALKPHOS 59  BILITOT 0.7  PROT 5.6*  ALBUMIN 2.2*   No results for input(s): LIPASE, AMYLASE in the last 168 hours. No results for input(s): AMMONIA in the last 168 hours. Coagulation Profile: Recent Labs  Lab 01/20/19 0038 01/21/19 0602 01/22/19 0745 01/23/19 0402 01/24/19 0842  INR 1.5* 1.4* 1.4* 1.5* 1.8*   Cardiac Enzymes: No results for input(s): CKTOTAL, CKMB, CKMBINDEX, TROPONINI in the last 168 hours. BNP (last 3 results) No results for input(s): PROBNP in the last 8760 hours. HbA1C: No results for input(s): HGBA1C in the last 72 hours. CBG: No results for input(s): GLUCAP in the last 168 hours.  Lipid Profile: No results for input(s): CHOL, HDL, LDLCALC, TRIG, CHOLHDL, LDLDIRECT in the last 72 hours. Thyroid Function Tests: No results for input(s): TSH, T4TOTAL, FREET4, T3FREE, THYROIDAB in the last 72 hours. Anemia Panel: No results for input(s): VITAMINB12, FOLATE, FERRITIN, TIBC, IRON, RETICCTPCT in the last 72 hours. Sepsis Labs: Recent Labs  Lab 01/19/19 1909 01/19/19 2141  LATICACIDVEN 1.8 1.6    Recent Results (from the past 240 hour(s))  SARS Coronavirus 2 (Performed in [place] hospital lab)     Status: None   Collection Time: 01/16/19 10:12 AM   Specimen: Nasal Swab  Result Value Ref Range Status   SARS Coronavirus 2 NEGATIVE NEGATIVE Final    Comment: (NOTE) SARS-CoV-2 target nucleic acids are NOT DETECTED. The SARS-CoV-2 RNA is generally detectable in upper and lower respiratory specimens during the acute phase of infection. Negative results do not preclude SARS-CoV-2 infection, do not rule out co-infections with other pathogens, and should not be used as the sole basis for treatment or other patient management decisions. Negative results must be  combined with clinical observations, patient history, and epidemiological information. The expected result is Negative. Fact Sheet for Patients: SugarRoll.be Fact Sheet for Healthcare Providers: https://www.woods-mathews.com/ This test is not yet approved or cleared by the [place] FDA and  has been authorized for detection and/or diagnosis of SARS-CoV-2 by FDA under an Emergency Use Authorization (EUA). This EUA will remain  in effect (meaning this test can be used) for the duration of the COVID-19 declaration under Section 56 4(b)(1) of the Act, 21 U.S.C. section 360bbb-3(b)(1), unless the authorization is terminated or revoked sooner. Performed at [facility], [place] [address] [place], [place] [address]   Culture, blood (routine x 2)     Status: None    Collection Time: 01/19/19  9:41 PM   Specimen: BLOOD  Result Value Ref Range Status   Specimen Description BLOOD RIGHT WRIST  Final   Special Requests   Final    BOTTLES DRAWN AEROBIC ONLY Blood Culture adequate volume   Culture   Final    NO GROWTH 5 DAYS Performed at [facility], [address], [place], [place] [address]    Report Status 01/24/2019 FINAL  Final  Culture, blood (routine x 2)     Status: None   Collection Time: 01/19/19  9:41 PM   Specimen: BLOOD  Result Value Ref Range Status   Specimen Description BLOOD RIGHT HAND  Final   Special Requests   Final    BOTTLES DRAWN AEROBIC ONLY Blood Culture adequate volume   Culture   Final    NO GROWTH 5 DAYS Performed at [facility], [place] [address] [place], [place] [address]    Report Status 01/24/2019 FINAL  Final  Culture, blood (routine x 2)     Status: None (Preliminary result)   Collection Time: 01/23/19 12:31 PM   Specimen: BLOOD  Result Value Ref Range Status   Specimen Description BLOOD BLOOD LEFT HAND  Final   Special Requests AEROBIC BOTTLE ONLY Blood Culture adequate volume  Final   Culture   Final    NO GROWTH < 24 HOURS Performed at [facility], [place] [address], [place], [place] [address]    Report Status PENDING  Incomplete  Culture, blood (routine x 2)     Status: None (Preliminary result)   Collection Time: 01/23/19 12:31 PM   Specimen: BLOOD  Result Value Ref Range Status   Specimen Description BLOOD BLOOD LEFT HAND  Final   Special Requests AEROBIC BOTTLE ONLY Blood Culture adequate volume  Final   Culture   Final    NO GROWTH < 24 HOURS Performed at [facility], [place] [address] [place], [place] [address]    Report Status PENDING  Incomplete  Expectorated sputum assessment w rflx to resp cult     Status: None   Collection Time: 01/24/19  5:51 AM   Specimen: Sputum  Result Value Ref Range Status   Specimen Description SPUTUM  Final   Special Requests NONE  Final    Sputum evaluation   Final    THIS SPECIMEN IS ACCEPTABLE FOR SPUTUM CULTURE Performed at [facility], [place] [address], [place],  [address]    Report Status 01/24/2019 FINAL  Final  Culture, respiratory     Status: None (Preliminary result)   Collection Time: 01/24/19  5:51 AM   Specimen: SPU  Result Value Ref Range Status   Specimen Description SPUTUM  Final   Special Requests  NONE Reflexed from [id number]  Final   Gram Stain   Final    FEW WBC PRESENT, PREDOMINANTLY PMN ABUNDANT GRAM NEGATIVE RODS ABUNDANT GRAM POSITIVE COCCI IN PAIRS IN CLUSTERS FEW GRAM POSITIVE RODS Performed at [facility], [place] [address] [place], [place] [address]    Culture PENDING  Incomplete   Report Status PENDING  Incomplete         Radiology Studies: Dg Chest 2 View  Result Date: 01/23/2019 CLINICAL DATA:  Fever, cough and chest pain. EXAM: CHEST - 2 VIEW COMPARISON:  Single-view of the chest 01/19/2019, 01/07/2019. PA and lateral chest 11/24/2017 and 08/10/2017. CT chest 01/20/2019. FINDINGS: The lungs are emphysematous. No consolidative process, pneumothorax or effusion. There is cardiomegaly. Surgical clips left axilla are noted. There are air-fluid levels projecting in the left upper chest and left axilla correlating with findings on recent CT scan. Surgical drains in the anterior and posterior left chest are noted. IMPRESSION: Postoperative change left chest with surgical drains present. Air-fluid levels in the left upper chest and axilla correlate with fluid collections in the left chest seen on recent CT. Emphysema. Cardiomegaly. Electronically Signed   By: Inge Rise M.D.   On: 01/23/2019 11:03        Scheduled Meds: . dextromethorphan-guaiFENesin  1 tablet Oral BID  . enoxaparin (LOVENOX) injection  80 mg Subcutaneous Q12H  . furosemide  40 mg Oral Daily  . gabapentin  600 mg Oral BID  . metoprolol succinate  12.5 mg Oral QHS  . potassium chloride  40 mEq Oral BID   . senna  1 tablet Oral BID  . simvastatin  20 mg Oral Daily  . umeclidinium-vilanterol  1 puff Inhalation Daily  . warfarin  7.5 mg Oral ONCE-1800  . Warfarin - Pharmacist Dosing Inpatient   Does not apply q1800   Continuous Infusions: . ceFEPime (MAXIPIME) IV 2 g (01/24/19 1256)  . vancomycin 1,000 mg (01/24/19 0645)     LOS: 6 days    Time spent: 25 minutes    Barb Merino, MD [facility] Pager [phone]  If 7PM-7AM, please contact night-coverage [email or web address] Password [email or web address] 01/24/2019, 2:27 PM

## 2019-01-24 NOTE — Progress Notes (Signed)
  Progress Note   Date: 01/20/2019  Patient Name: Kim Anthony        [name]#: [id number]   Review of the patient's clinical findings supports the diagnosis of surgical blood loss and hydration / fluid resusitation.

## 2019-01-24 NOTE — Progress Notes (Signed)
Subjective: The patient is post operative day 6 from a left latissimus muscle flap for chest wall defect after radiation damage.  Objective: Vital signs in last 24 hours: Temp:  [98.3 F (36.8 C)-100.7 F (38.2 C)] 100.7 F (38.2 C) (08/04 2013) Pulse Rate:  [90-101] 101 (08/04 2013) Resp:  [15-21] 16 (08/04 2013) BP: (110-149)/(59-74) 129/71 (08/04 2013) SpO2:  [91 %-100 %] 95 % (08/04 2013) Weight change:  Last BM Date: 01/18/19  Intake/Output from previous day: 08/03 0701 - 08/04 0700 In: 2123.5 [P.O.:1000; IV Piggyback:1123.5] Out: 45 [Drains:45] Intake/Output this shift: No intake/output data recorded.  General appearance: alert, cooperative and no distress Chest wall: no tenderness, left sided chest wall tenderness Open area on superior aspect of native skin left chest wall. Not unexpected due to the radiation damage.  Lab Results: Recent Labs    01/23/19 0402 01/24/19 0842  WBC 12.2* 12.9*  HGB 9.3* 8.3*  HCT 30.0* 26.0*  PLT 369 480*   BMET Recent Labs    01/23/19 0402 01/24/19 0842  NA 132* 134*  K 3.6 2.9*  CL 102 102  CO2 19* 20*  GLUCOSE 117* 154*  BUN 11 9  CREATININE 0.73 0.76  CALCIUM 8.3* 8.5*    Studies/Results: Dg Chest 2 View  Result Date: 01/23/2019 CLINICAL DATA:  Fever, cough and chest pain. EXAM: CHEST - 2 VIEW COMPARISON:  Single-view of the chest 01/19/2019, 01/07/2019. PA and lateral chest 11/24/2017 and 08/10/2017. CT chest 01/20/2019. FINDINGS: The lungs are emphysematous. No consolidative process, pneumothorax or effusion. There is cardiomegaly. Surgical clips left axilla are noted. There are air-fluid levels projecting in the left upper chest and left axilla correlating with findings on recent CT scan. Surgical drains in the anterior and posterior left chest are noted. IMPRESSION: Postoperative change left chest with surgical drains present. Air-fluid levels in the left upper chest and axilla correlate with fluid collections in the  left chest seen on recent CT. Emphysema. Cardiomegaly. Electronically Signed   By: Inge Rise M.D.   On: 01/23/2019 11:03    Medications: I have reviewed the patient's current medications.  Assessment/Plan: Patient doing better overall.  Acell powder 1 gm placed on the open wound.  Will continue to monitor. Appreciate the excellent care and input from medicine team.  LOS: 6 days   Wallace Going 01/24/2019, 8:47 PM

## 2019-01-24 NOTE — Progress Notes (Signed)
ANTICOAGULATION CONSULT NOTE - Follow Up Consult  Pharmacy Consult for Coumadin Indication:  mech MV and chronic PE  Allergies  Allergen Reactions  . Acetaminophen-Codeine Itching  . Propoxyphene Itching    Darvocet  . Tramadol Itching and Nausea And Vomiting    Patient Measurements: Height: 5\' 5"  (165.1 cm) Weight: 171 lb (77.6 kg) IBW/kg (Calculated) : 57  Vital Signs: Temp: 98.6 F (37 C) (08/04 0804) Temp Source: Oral (08/04 0804) BP: 115/59 (08/04 0804) Pulse Rate: 97 (08/04 0804)  Labs: Recent Labs    01/22/19 0745 01/23/19 0402 01/24/19 0842  HGB 8.6* 9.3* 8.3*  HCT 27.6* 30.0* 26.0*  PLT 412* 369 480*  LABPROT 16.8* 18.0* 20.6*  INR 1.4* 1.5* 1.8*  CREATININE 0.63 0.73 0.76    Estimated Creatinine Clearance: 68.3 mL/min (by C-G formula based on SCr of 0.76 mg/dL).   Assessment: Anticoag: Lovenox and Warfarin for hx mech MV and chronic PE.  Kim Anthony manages Kim Anthony. Has been off Warfarin, on Enox 120 mg sq q24hrs. INR 1.1. Hgb 11.4 down with post-op anemia.  PTA Warfarin: 5 mg daily except 7.5 mg on Wed and Sun. Last dose 7/23. INR was 3 that day. Has home INR monitor.  Lovenox 120 mg sq q24hrs begun 7/24>>7/27, then 60 mg on 7/28 at 8am  Today, INR 1.8 - increase, doses charted given. Remains on Vancomycin and Cefepime. H/H low- at 8.3. Platelets are stable. No overt bleeding reported. Drains with serosanguineous fluid. No plans for wound I&D. Balancing bleed vs clot risk with chest wall wound and recent procedures. SCr stable.  Goal of Therapy:  INR 2.5-3.5 Monitor platelets by anticoagulation protocol: Yes   Plan:  Continue Lovenox 80mg /12h Warfarin 7.5 mg x 1 tonight. *Higher than home dose but INR moving slowly. Daily PT/INR, goal 2.5-3.5  Kim Anthony, PharmD, [name] Clinical Pharmacist Please see AMION for all Pharmacists' Contact Phone Numbers 01/24/2019, 9:52 AM

## 2019-01-25 DIAGNOSIS — I10 Essential (primary) hypertension: Secondary | ICD-10-CM

## 2019-01-25 DIAGNOSIS — I5032 Chronic diastolic (congestive) heart failure: Secondary | ICD-10-CM

## 2019-01-25 LAB — BASIC METABOLIC PANEL
Anion gap: 10 (ref 5–15)
BUN: 8 mg/dL (ref 8–23)
CO2: 22 mmol/L (ref 22–32)
Calcium: 8.7 mg/dL — ABNORMAL LOW (ref 8.9–10.3)
Chloride: 104 mmol/L (ref 98–111)
Creatinine, Ser: 0.64 mg/dL (ref 0.44–1.00)
GFR calc Af Amer: >60 mL/min (ref >60)
GFR calc non Af Amer: >60 mL/min (ref >60)
Glucose, Bld: 129 mg/dL — ABNORMAL HIGH (ref 70–99)
Potassium: 3.6 mmol/L (ref 3.5–5.1)
Sodium: 136 mmol/L (ref 135–145)

## 2019-01-25 LAB — CBC WITH DIFFERENTIAL/PLATELET
Abs Immature Granulocytes: 0.62 10*3/uL — ABNORMAL HIGH (ref 0.00–0.07)
Basophils Absolute: 0.1 10*3/uL (ref 0.0–0.1)
Basophils Relative: 1 %
Eosinophils Absolute: 0.3 10*3/uL (ref 0.0–0.5)
Eosinophils Relative: 2 %
HCT: 27.3 % — ABNORMAL LOW (ref 36.0–46.0)
Hemoglobin: 8.5 g/dL — ABNORMAL LOW (ref 12.0–15.0)
Immature Granulocytes: 4 %
Lymphocytes Relative: 14 %
Lymphs Abs: 2.2 10*3/uL (ref 0.7–4.0)
MCH: 30.2 pg (ref 26.0–34.0)
MCHC: 31.1 g/dL (ref 30.0–36.0)
MCV: 97.2 fL (ref 80.0–100.0)
Monocytes Absolute: 1.6 10*3/uL — ABNORMAL HIGH (ref 0.1–1.0)
Monocytes Relative: 10 %
Neutro Abs: 11.1 10*3/uL — ABNORMAL HIGH (ref 1.7–7.7)
Neutrophils Relative %: 69 %
Platelets: 614 10*3/uL — ABNORMAL HIGH (ref 150–400)
RBC: 2.81 MIL/uL — ABNORMAL LOW (ref 3.87–5.11)
RDW: 13.1 % (ref 11.5–15.5)
WBC: 15.8 10*3/uL — ABNORMAL HIGH (ref 4.0–10.5)
nRBC: 0.1 % (ref 0.0–0.2)

## 2019-01-25 LAB — PROTIME-INR
INR: 2.3 — ABNORMAL HIGH (ref 0.8–1.2)
Prothrombin Time: 25.2 seconds — ABNORMAL HIGH (ref 11.4–15.2)

## 2019-01-25 MED ORDER — WARFARIN SODIUM 5 MG PO TABS
5.0000 mg | ORAL_TABLET | Freq: Once | ORAL | Status: AC
  Administered 2019-01-25: 5 mg via ORAL
  Filled 2019-01-25: qty 1

## 2019-01-25 MED ORDER — POTASSIUM CHLORIDE CRYS ER 20 MEQ PO TBCR
20.0000 meq | EXTENDED_RELEASE_TABLET | Freq: Every day | ORAL | Status: DC
  Administered 2019-01-26 – 2019-02-01 (×7): 20 meq via ORAL
  Filled 2019-01-25 (×7): qty 1

## 2019-01-25 MED ORDER — SODIUM CHLORIDE 0.9 % IV SOLN
500.0000 mg | INTRAVENOUS | Status: DC
  Administered 2019-01-25: 500 mg via INTRAVENOUS
  Filled 2019-01-25 (×2): qty 500

## 2019-01-25 NOTE — Progress Notes (Signed)
7 Days Post-Op  Subjective: Kim Anthony is 7 days post-op from left latissimus flap.  Upon evaluation today she was up and walking in her room. She is eating and drinking. She has had multiple BMs since last visit.   Continues to have left chest wall pain.  She feels as if her cough has improved. She notes some back pain. No other complaints.  Denies n/v Objective: Vital signs in last 24 hours: Temp:  [97.6 F (36.4 C)-100.7 F (38.2 C)] 98.4 F (36.9 C) (08/05 1536) Pulse Rate:  [77-101] 88 (08/05 1536) Resp:  [16-20] 16 (08/05 1536) BP: (105-167)/(58-76) 106/69 (08/05 1536) SpO2:  [92 %-100 %] 92 % (08/05 1536) Last BM Date: 01/24/19  Intake/Output from previous day: 08/04 0701 - 08/05 0700 In: 720 [P.O.:720] Out: 85 [Drains:85] Intake/Output this shift: Total I/O In: 240 [P.O.:240] Out: -   General appearance: Alert, NAD, ambulating prior to evaluation Chest wall: Left sided chest wall tenderness. Opening along superior aspect of latissimus flap incision. Small opening along inferomedial aspect of flap. Good capillary refill. Serosanguinous drainage noted. No purulence. No fluctuance noted over shoulder. Erythema not extending past previous marks. Acell in place with adaptic.  MSK: L arm compression sleeve on. Pulses: 2+ and symmetric. Extremities: No edema of lower extremities noted.  Lab Results:  CBC    Component Value Date/Time   WBC 15.8 (H) 01/25/2019 0715   RBC 2.81 (L) 01/25/2019 0715   HGB 8.5 (L) 01/25/2019 0715   HGB 12.5 04/21/2018 1624   HCT 27.3 (L) 01/25/2019 0715   HCT 36.0 04/21/2018 1624   PLT 614 (H) 01/25/2019 0715   PLT 403 04/21/2018 1624   MCV 97.2 01/25/2019 0715   MCV 94 04/21/2018 1624   MCH 30.2 01/25/2019 0715   MCHC 31.1 01/25/2019 0715   RDW 13.1 01/25/2019 0715   RDW 11.4 (L) 04/21/2018 1624   LYMPHSABS 2.2 01/25/2019 0715   LYMPHSABS 3.0 04/21/2018 1624   MONOABS 1.6 (H) 01/25/2019 0715   EOSABS 0.3 01/25/2019 0715   EOSABS  0.2 04/21/2018 1624   BASOSABS 0.1 01/25/2019 0715   BASOSABS 0.1 04/21/2018 1624    BMET Recent Labs    01/24/19 0842 01/25/19 0715  NA 134* 136  K 2.9* 3.6  CL 102 104  CO2 20* 22  GLUCOSE 154* 129*  BUN 9 8  CREATININE 0.76 0.64  CALCIUM 8.5* 8.7*   PT/INR Recent Labs    01/24/19 0842 01/25/19 0715  LABPROT 20.6* 25.2*  INR 1.8* 2.3*   ABG No results for input(s): PHART, HCO3 in the last 72 hours.  Invalid input(s): PCO2, PO2  Studies/Results: No results found.  Anti-infectives: Anti-infectives (From admission, onward)   Start     Dose/Rate Route Frequency Ordered Stop   01/25/19 1500  azithromycin (ZITHROMAX) 500 mg in sodium chloride 0.9 % 250 mL IVPB     500 mg 250 mL/hr over 60 Minutes Intravenous Every 24 hours 01/25/19 1414     01/23/19 1800  vancomycin (VANCOCIN) IVPB 1000 mg/200 mL premix     1,000 mg 200 mL/hr over 60 Minutes Intravenous Every 12 hours 01/23/19 0541     01/19/19 2200  ceFEPIme (MAXIPIME) 2 g in sodium chloride 0.9 % 100 mL IVPB     2 g 200 mL/hr over 30 Minutes Intravenous Every 8 hours 01/19/19 2120     01/19/19 2200  vancomycin (VANCOCIN) 1,500 mg in sodium chloride 0.9 % 500 mL IVPB  Status:  Discontinued  1,500 mg 250 mL/hr over 120 Minutes Intravenous Every 24 hours 01/19/19 2123 01/23/19 0541   01/19/19 1900  sulfamethoxazole-trimethoprim (BACTRIM) 291.04 mg in dextrose 5 % 250 mL IVPB  Status:  Discontinued     15 mg/kg/day  77.6 kg 268.2 mL/hr over 60 Minutes Intravenous Every 6 hours 01/19/19 1824 01/19/19 2054   01/18/19 2200  ceFAZolin (ANCEF) IVPB 2g/100 mL premix  Status:  Discontinued     2 g 200 mL/hr over 30 Minutes Intravenous Every 8 hours 01/18/19 1804 01/19/19 2042   01/18/19 1155  ceFAZolin (ANCEF) 2-4 GM/100ML-% IVPB    Note to Pharmacy: Merryl Hacker   : cabinet override      01/18/19 1155 01/18/19 2359      Assessment/Plan: s/p Procedure(s): LEFT LATISSIMUS FLAP TO BREAST - 7 days post -  op  No further surgery planned during this admission. Continue to follow WBC and intermittent fever spikes.   Drains in place (3), draining serosanguinous fluid from left chest wall as well as left latissimus back incision. Continue to measure BID volume.  Azithromycin added today per medicine for atypical coverage.   Additional donated Acell applied to left chest wound today. Daily AM KY jelly/surgilube dressing changes. Leave the adaptic mesh in place over Acell powder and place ABD over new KY jelly/surgilube, which should be applied to the exterior portion of the adaptic.  Appreciate assistance from medicine.    LOS: 7 days   Charlies Constable, PA-C 01/25/2019  [phone]

## 2019-01-25 NOTE — Progress Notes (Signed)
PROGRESS NOTE    Kim Anthony  LOV:[id number] DOB: 1951/01/28 DOA: 01/18/2019 PCP: Marianna Payment, MD      Brief Narrative:  Kim Anthony is a 68 y.o. F with dCHF, HTN, hx mechanical MV and hx PE on warfarin, COPD and breast cancer of left breast s/p radiation therapy and now a chronic right chest wall and axillary wound who presented for left chest wall wound reconstruction by Plastic Surgery.  Post-operatively, she developed fever, tachycardia, hypotension, and the hospitalist service were asked to admit for further medical care.  On admission, CXR clear, SARS-CoV-2 negative.  CTA chest the next day showed hazy bilateral opacities, no PE, but ALSO air fluid levels and evidence of chest wall necrosis possible cellulitis.  She was suspected to have sepsis from chest wall cellulitis and was started on vanc, cefepime.         Assessment & Plan:  Sepsis Source has been thought to be either lung/CAP or chest wall/cellulitis. Her WBC improved initially but she has had persistent fevers despite 7 days vancomycin and cefepime and now her WBC is going back up.  Fever yesterday to 100.90F.  Blood culture negative, sputum culture with positive gram stain, may just be normal respiratory flora, identifications pending.  -Continue vancomycin, cefepime -Check legionella antigens -Add azithromycin   History mechanical MV History PE INR therapeutic today -Continue Lovenox Bridge one more day then stop if INR remains therapeutic -Continue warfarin -Daily INR  Chronic diastolic CHF Hypertension No orthopnea or edema to suggest flare.  Suspect infiltrates on CTA are infectious. BP controlled -Continue home furosemide -Continue metoprolol -Continue simvastatin -Hold lisinopril  COPD -Continue Vilanterol-umec  Other medciations -Continue gabapentin   Anemia of chornic disease Stable, no clinical bleeding  Hypokalemia  Resolved -Continue K supplement -Stop Kphos        MDM  and disposition: The below labs and imaging reports were reviewed and summarized above.  Medication management as above.  THis is a severe acute illness with threat to life or bodily function.   The patient was admitted with sepsis from axillary wound cellulitis.    SIRS physiology is resolved, but patient has persistent fever despite broad specturm antibiotics as well as rising WBC.  Will broaden Abx, follow sputum culture.          DVT prophylaxis: N/A on warfarin Code Status: FULL Family Communication: Daughter by phone    Consultants:   Plastic surgery  Procedures:   Flap reconstruction 7/29  Antimicrobials:   Cefazolin 7/29 >> 7/30  Vancomycin 7/30 >>  Cefepime 7/30 >>  Azithromycin 8/5 >>  Cultures:   7/30 blood cultures x2 -- NG  8/3 blood culture x2 -- NGTD  8/4 sputum culture -- gram stain positive, ID pending        Subjective: Has some burning back pain.  Has a cough, productive of yellow sputum still.  Has shooting left axilla pains and left chest wall pains.  No vomting. NO confusion.  Fever yesteday afternoon.     Objective: Vitals:   01/24/19 2317 01/25/19 0426 01/25/19 0738 01/25/19 0757  BP: (!) 105/59 (!) 167/64 111/76   Pulse: 89 77 77   Resp: 20 18 16    Temp: 98.3 F (36.8 C) 97.6 F (36.4 C) 97.9 F (36.6 C)   TempSrc: Oral Oral Oral   SpO2: 96% 94% 95% 92%  Weight:      Height:        Intake/Output Summary (Last 24 hours) at 01/25/2019 0929 Last  data filed at 01/25/2019 0430 Gross per 24 hour  Intake 600 ml  Output 85 ml  Net 515 ml   Filed Weights   01/18/19 1900  Weight: 77.6 kg    Examination: General appearance:  adult female, alert and in no acute distress.  Sitting in chair HEENT: Anicteric, conjunctiva pink, lids and lashes normal. No nasal deformity, discharge, epistaxis.  Lips moist, edentulous, no oral lesions OP moist, hearing normal.   Skin: Warm and dry.  THe left axilla wound appears tender all  around, no obvious swelling, no obvious purulent drainage, drains in place with serosanquinous discharge.   Cardiac: RRR, nl S1-S2, no murmurs appreciated.  Capillary refill is brisk.  JVP normal.  No LE edema.  Radial pulses 2+ and symmetric. Respiratory: Normal respiratory rate and rhythm.  CTAB without rales or wheezes. Abdomen: Abdomen soft.  No TTP. No ascites, distension, hepatosplenomegaly.   MSK: No deformities or effusions, no pain to palpation of the mid back where burning is located. Neuro: Awake and alert.  EOMI, moves all extremities. Speech fluent.    Psych: Sensorium intact and responding to questions, attention normal. Affect normal.  Judgment and insight appear normal.    Data Reviewed: I have personally reviewed following labs and imaging studies:  CBC: Recent Labs  Lab 01/20/19 0038 01/21/19 0711 01/22/19 0745 01/23/19 0402 01/24/19 0842 01/25/19 0715  WBC 24.4* 20.4* 15.5* 12.2* 12.9* 15.8*  NEUTROABS 18.0*  --  11.6* 8.3* 9.7* 11.1*  HGB 9.1* 8.4* 8.6* 9.3* 8.3* 8.5*  HCT 28.9* 26.5* 27.6* 30.0* 26.0* 27.3*  MCV 100.0 97.8 97.9 97.4 95.6 97.2  PLT 421* 354 412* 369 480* 017*   Basic Metabolic Panel: Recent Labs  Lab 01/21/19 0602 01/22/19 0745 01/23/19 0402 01/24/19 0842 01/25/19 0715  NA 134* 134* 132* 134* 136  K 4.1 3.9 3.6 2.9* 3.6  CL 110 103 102 102 104  CO2 20* 18* 19* 20* 22  GLUCOSE 114* 104* 117* 154* 129*  BUN 8 9 11 9 8   CREATININE 0.73 0.63 0.73 0.76 0.64  CALCIUM 7.7* 8.5* 8.3* 8.5* 8.7*  MG  --   --   --  1.7  --   PHOS  --   --   --  2.4*  --    GFR: Estimated Creatinine Clearance: 68.3 mL/min (by C-G formula based on SCr of 0.64 mg/dL). Liver Function Tests: Recent Labs  Lab 01/20/19 0038  AST 24  ALT 13  ALKPHOS 59  BILITOT 0.7  PROT 5.6*  ALBUMIN 2.2*   No results for input(s): LIPASE, AMYLASE in the last 168 hours. No results for input(s): AMMONIA in the last 168 hours. Coagulation Profile: Recent Labs  Lab  01/21/19 0602 01/22/19 0745 01/23/19 0402 01/24/19 0842 01/25/19 0715  INR 1.4* 1.4* 1.5* 1.8* 2.3*   Cardiac Enzymes: No results for input(s): CKTOTAL, CKMB, CKMBINDEX, TROPONINI in the last 168 hours. BNP (last 3 results) No results for input(s): PROBNP in the last 8760 hours. HbA1C: No results for input(s): HGBA1C in the last 72 hours. CBG: No results for input(s): GLUCAP in the last 168 hours. Lipid Profile: No results for input(s): CHOL, HDL, LDLCALC, TRIG, CHOLHDL, LDLDIRECT in the last 72 hours. Thyroid Function Tests: No results for input(s): TSH, T4TOTAL, FREET4, T3FREE, THYROIDAB in the last 72 hours. Anemia Panel: No results for input(s): VITAMINB12, FOLATE, FERRITIN, TIBC, IRON, RETICCTPCT in the last 72 hours. Urine analysis:    Component Value Date/Time   COLORURINE YELLOW 01/20/2019 7939  APPEARANCEUR CLEAR 01/20/2019 0611   LABSPEC 1.033 (H) 01/20/2019 0611   PHURINE 6.0 01/20/2019 0611   GLUCOSEU NEGATIVE 01/20/2019 0611   HGBUR NEGATIVE 01/20/2019 0611   BILIRUBINUR NEGATIVE 01/20/2019 0611   KETONESUR NEGATIVE 01/20/2019 0611   PROTEINUR NEGATIVE 01/20/2019 0611   NITRITE NEGATIVE 01/20/2019 0611   LEUKOCYTESUR TRACE (A) 01/20/2019 0611   Sepsis Labs: @LABRCNTIP (procalcitonin:4,lacticacidven:4)  ) Recent Results (from the past 240 hour(s))  SARS Coronavirus 2 (Performed in [facility] hospital lab)     Status: None   Collection Time: 01/16/19 10:12 AM   Specimen: Nasal Swab  Result Value Ref Range Status   SARS Coronavirus 2 NEGATIVE NEGATIVE Final    Comment: (NOTE) SARS-CoV-2 target nucleic acids are NOT DETECTED. The SARS-CoV-2 RNA is generally detectable in upper and lower respiratory specimens during the acute phase of infection. Negative results do not preclude SARS-CoV-2 infection, do not rule out co-infections with other pathogens, and should not be used as the sole basis for treatment or other patient management decisions. Negative  results must be combined with clinical observations, patient history, and epidemiological information. The expected result is Negative. Fact Sheet for Patients: SugarRoll.be Fact Sheet for Healthcare Providers: https://www.woods-mathews.com/ This test is not yet approved or cleared by the [place] FDA and  has been authorized for detection and/or diagnosis of SARS-CoV-2 by FDA under an Emergency Use Authorization (EUA). This EUA will remain  in effect (meaning this test can be used) for the duration of the COVID-19 declaration under Section 56 4(b)(1) of the Act, 21 U.S.C. section 360bbb-3(b)(1), unless the authorization is terminated or revoked sooner. Performed at [facility], [place] [address] [place], [place] [address]   Culture, blood (routine x 2)     Status: None   Collection Time: 01/19/19  9:41 PM   Specimen: BLOOD  Result Value Ref Range Status   Specimen Description BLOOD RIGHT WRIST  Final   Special Requests   Final    BOTTLES DRAWN AEROBIC ONLY Blood Culture adequate volume   Culture   Final    NO GROWTH 5 DAYS Performed at [facility], [address], [place], [place] [address]    Report Status 01/24/2019 FINAL  Final  Culture, blood (routine x 2)     Status: None   Collection Time: 01/19/19  9:41 PM   Specimen: BLOOD  Result Value Ref Range Status   Specimen Description BLOOD RIGHT HAND  Final   Special Requests   Final    BOTTLES DRAWN AEROBIC ONLY Blood Culture adequate volume   Culture   Final    NO GROWTH 5 DAYS Performed at [facility], [place] [address], [place], [place] [address]    Report Status 01/24/2019 FINAL  Final  Culture, blood (routine x 2)     Status: None (Preliminary result)   Collection Time: 01/23/19 12:31 PM   Specimen: BLOOD  Result Value Ref Range Status   Specimen Description BLOOD BLOOD LEFT HAND  Final   Special Requests AEROBIC BOTTLE ONLY Blood Culture adequate  volume  Final   Culture   Final    NO GROWTH 1 DAY Performed at [facility], [place] [address], [place],  [address]    Report Status PENDING  Incomplete  Culture, blood (routine x 2)     Status: None (Preliminary result)   Collection Time: 01/23/19 12:31 PM   Specimen: BLOOD  Result Value Ref Range Status   Specimen Description  BLOOD BLOOD LEFT HAND  Final   Special Requests AEROBIC BOTTLE ONLY Blood Culture adequate volume  Final   Culture   Final    NO GROWTH 1 DAY Performed at [facility], [place] [address], [place], [place] [address]    Report Status PENDING  Incomplete  Expectorated sputum assessment w rflx to resp cult     Status: None   Collection Time: 01/24/19  5:51 AM   Specimen: Sputum  Result Value Ref Range Status   Specimen Description SPUTUM  Final   Special Requests NONE  Final   Sputum evaluation   Final    THIS SPECIMEN IS ACCEPTABLE FOR SPUTUM CULTURE Performed at [facility], [place] [address] [place], [place] [address]    Report Status 01/24/2019 FINAL  Final  Culture, respiratory     Status: None (Preliminary result)   Collection Time: 01/24/19  5:51 AM   Specimen: SPU  Result Value Ref Range Status   Specimen Description SPUTUM  Final   Special Requests NONE Reflexed from [id number]  Final   Gram Stain   Final    FEW WBC PRESENT, PREDOMINANTLY PMN ABUNDANT GRAM NEGATIVE RODS ABUNDANT GRAM POSITIVE COCCI IN PAIRS IN CLUSTERS FEW GRAM POSITIVE RODS Performed at [facility], [place] [address], [place], [place] [address]    Culture PENDING  Incomplete   Report Status PENDING  Incomplete         Radiology Studies: Dg Chest 2 View  Result Date: 01/23/2019 CLINICAL DATA:  Fever, cough and chest pain. EXAM: CHEST - 2 VIEW COMPARISON:  Single-view of the chest 01/19/2019, 01/07/2019. PA and lateral chest 11/24/2017 and 08/10/2017. CT chest 01/20/2019. FINDINGS: The lungs are emphysematous. No consolidative process, pneumothorax or  effusion. There is cardiomegaly. Surgical clips left axilla are noted. There are air-fluid levels projecting in the left upper chest and left axilla correlating with findings on recent CT scan. Surgical drains in the anterior and posterior left chest are noted. IMPRESSION: Postoperative change left chest with surgical drains present. Air-fluid levels in the left upper chest and axilla correlate with fluid collections in the left chest seen on recent CT. Emphysema. Cardiomegaly. Electronically Signed   By: Inge Rise M.D.   On: 01/23/2019 11:03        Scheduled Meds: . dextromethorphan-guaiFENesin  1 tablet Oral BID  . enoxaparin (LOVENOX) injection  80 mg Subcutaneous Q12H  . furosemide  40 mg Oral Daily  . gabapentin  600 mg Oral BID  . metoprolol succinate  12.5 mg Oral QHS  . potassium & sodium phosphates  1 packet Oral TID WC & HS  . potassium chloride  40 mEq Oral BID  . senna  1 tablet Oral BID  . simvastatin  20 mg Oral Daily  . umeclidinium-vilanterol  1 puff Inhalation Daily  . warfarin  5 mg Oral ONCE-1800  . Warfarin - Pharmacist Dosing Inpatient   Does not apply q1800   Continuous Infusions: . ceFEPime (MAXIPIME) IV 2 g (01/25/19 0505)  . vancomycin 1,000 mg (01/25/19 0552)     LOS: 7 days    Time spent: 35 minutes    Edwin Dada, MD [facility] 01/25/2019, 9:29 AM     Please page through [place]:  [email or web address] Password [email or web address] If 7PM-7AM, please contact night-coverage

## 2019-01-25 NOTE — Care Management Important Message (Signed)
Important Message  Patient Details  Name: Kim Anthony [name]: [id number] Date of Birth: February 21, 1951   Medicare Important Message Given:  Yes     Memory Argue 01/25/2019, 3:12 PM   IM GIVEN TO PATIENT

## 2019-01-25 NOTE — Progress Notes (Signed)
ANTICOAGULATION CONSULT NOTE - Follow Up Consult  Pharmacy Consult for Coumadin Indication:  mech MV and chronic PE  Allergies  Allergen Reactions  . Acetaminophen-Codeine Itching  . Propoxyphene Itching    Darvocet  . Tramadol Itching and Nausea And Vomiting    Patient Measurements: Height: 5\' 5"  (165.1 cm) Weight: 171 lb (77.6 kg) IBW/kg (Calculated) : 57  Vital Signs: Temp: 97.9 F (36.6 C) (08/05 0738) Temp Source: Oral (08/05 0738) BP: 111/76 (08/05 0738) Pulse Rate: 77 (08/05 0738)  Labs: Recent Labs    01/23/19 0402 01/24/19 0842 01/25/19 0715  HGB 9.3* 8.3* 8.5*  HCT 30.0* 26.0* 27.3*  PLT 369 480* 614*  LABPROT 18.0* 20.6* 25.2*  INR 1.5* 1.8* 2.3*  CREATININE 0.73 0.76 0.64    Estimated Creatinine Clearance: 68.3 mL/min (by C-G formula based on SCr of 0.64 mg/dL).   Assessment: Anticoag: Lovenox and Warfarin for hx mech MV and chronic PE.  Has been off Warfarin, on Enox 120 mg sq q24hrs.  PTA Warfarin: 5 mg daily except 7.5 mg on Wed and Sun. Last dose 7/23. INR was 3 that day.   Continues on enox 80 mg q12 bridge  INR 2.3 today  H&H stable  Goal of Therapy:  INR 2.5-3.5 Monitor platelets by anticoagulation protocol: Yes   Plan:  Continue Lovenox 80mg /12h Warfarin 5 mg x 1 Daily INR  Levester Fresh, PharmD, BCPS, BCCCP Clinical Pharmacist [phone]  Please check AMION for all [employer] numbers  01/25/2019 8:32 AM

## 2019-01-25 NOTE — Evaluation (Addendum)
Occupational Therapy Evaluation Patient Details Name: Kim Anthony [name]: [id number] DOB: 1950/08/07 Today's Date: 01/25/2019    History of Present Illness Pt is a 68 y.o. F with significant PMH of hypertension, CHF (EF 50-55%), h/o MVR, PE, L breast cancer s/p XRT with chronic wound who presented on 01/19/2019 for latissimus myocutaneous flap to reconstruct the left chest wall and developed sepsis.01/20/19 per RN noteincision has completely opened up.Patient  left chest wall and left arm extending to neck red,warm and swollen   Clinical Impression   Pt able to complete full adl at sink with only (A) to manage drains and apply compression garment. Pt reports daughter helped with this task previously. Pt otherwise at adequate level to d/c home. OT goals met. Ot to sign off acutely.     Follow Up Recommendations  No OT follow up    Equipment Recommendations  None recommended by OT    Recommendations for Other Services       Precautions / Restrictions Precautions Precautions: Fall Precaution Comments: 3 JP drains Restrictions Weight Bearing Restrictions: No      Mobility Bed Mobility Overal bed mobility: Modified Independent                Transfers Overall transfer level: Needs assistance   Transfers: Sit to/from Stand Sit to Stand: Supervision              Balance                                           ADL either performed or assessed with clinical judgement   ADL Overall ADL's : Needs assistance/impaired Eating/Feeding: Independent   Grooming: Modified independent;Sitting   Upper Body Bathing: Modified independent;Sitting   Lower Body Bathing: Modified independent;Sit to/from stand   Upper Body Dressing : Sitting;Min guard   Lower Body Dressing: Min guard;Sit to/from stand   Toilet Transfer: Magazine features editor Details (indicate cue type and reason): cues for safety with IV Toileting- Clothing Manipulation and Hygiene: Min  guard;Sit to/from stand       Functional mobility during ADLs: Supervision/safety General ADL Comments: pt completed full ADL at sink and changing of compression garment. pt requires min (A) for garment due to tight fit with drains. pt is unable to see the clasp of safety pins to open and close. pt reports that daughter and sister managed the drains last time.      Vision         Perception     Praxis      Pertinent Vitals/Pain Pain Assessment: No/denies pain     Hand Dominance     Extremity/Trunk Assessment Upper Extremity Assessment Upper Extremity Assessment: LUE deficits/detail LUE Deficits / Details: slight edema noted. pt provided compression sleeve in room with education to done           Communication     Cognition Arousal/Alertness: Awake/alert Behavior During Therapy: [facility] for tasks assessed/performed Overall Cognitive Status: Impaired/Different from baseline Area of Impairment: Memory                     Memory: Decreased short-term memory         General Comments: pt asking questions that she previously asked and wanting clarfication multiple times during session. pt reports that granddaughter was born in June then later January.    General Comments  VSS  Occupational Therapy Evaluation Patient Details Name: Kim Anthony [name]: [id number] DOB: 01/05/1951 Today's Date: 01/25/2019    History of Present Illness Pt is a 68 y.o. F with significant PMH of hypertension, CHF (EF 50-55%), h/o MVR, PE, L breast cancer s/p XRT with chronic wound who presented on 01/19/2019 for latissimus myocutaneous flap to reconstruct the left chest wall and developed sepsis.01/20/19 per RN noteincision has completely opened up.Patient  left chest wall and left arm extending to neck red,warm and swollen   Clinical Impression   Pt able to complete full adl at sink with only (A) to manage drains and apply compression garment. Pt reports daughter helped with this task previously. Pt otherwise at adequate level to d/c home. OT goals met. Ot to sign off acutely.     Follow Up Recommendations  No OT follow up    Equipment Recommendations  None recommended by OT    Recommendations for Other Services       Precautions / Restrictions Precautions Precautions: Fall Precaution Comments: 3 JP drains Restrictions Weight Bearing Restrictions: No      Mobility Bed Mobility Overal bed mobility: Modified Independent                Transfers Overall transfer level: Needs assistance   Transfers: Sit to/from Stand Sit to Stand: Supervision              Balance                                           ADL either performed or assessed with clinical judgement   ADL Overall ADL's : Needs assistance/impaired Eating/Feeding: Independent   Grooming: Modified independent;Sitting   Upper Body Bathing: Modified independent;Sitting   Lower Body Bathing: Modified independent;Sit to/from stand   Upper Body Dressing : Sitting;Min guard   Lower Body Dressing: Min guard;Sit to/from stand   Toilet Transfer: Min guard Toilet Transfer Details (indicate cue type and reason): cues for safety with IV Toileting- Clothing Manipulation and Hygiene: Min  guard;Sit to/from stand       Functional mobility during ADLs: Supervision/safety General ADL Comments: pt completed full ADL at sink and changing of compression garment. pt requires min (A) for garment due to tight fit with drains. pt is unable to see the clasp of safety pins to open and close. pt reports that daughter and sister managed the drains last time.      Vision         Perception     Praxis      Pertinent Vitals/Pain Pain Assessment: No/denies pain     Hand Dominance     Extremity/Trunk Assessment Upper Extremity Assessment Upper Extremity Assessment: LUE deficits/detail LUE Deficits / Details: slight edema noted. pt provided compression sleeve in room with education to done           Communication     Cognition Arousal/Alertness: Awake/alert Behavior During Therapy: WFL for tasks assessed/performed Overall Cognitive Status: Impaired/Different from baseline Area of Impairment: Memory                     Memory: Decreased short-term memory         General Comments: pt asking questions that she previously asked and wanting clarfication multiple times during session. pt reports that granddaughter was born in June then later January.    General Comments  VSS    

## 2019-01-25 NOTE — Progress Notes (Signed)
Physical Therapy Treatment Patient Details Name: Kim Anthony [name]: [id number] DOB: [birth year] Today's Date: 01/25/2019    History of Present Illness Pt is a 68 y.o. F with significant PMH of hypertension, CHF (EF 50-55%), h/o MVR, PE, L breast cancer s/p XRT with chronic wound who presented on 01/19/2019 for latissimus myocutaneous flap to reconstruct the left chest wall and developed sepsis.01/20/19 per RN noteincision has completely opened up.Patient  left chest wall and left arm extending to neck red,warm and swollen    PT Comments    Pt progressing well towards physical therapy goals with seemingly improved pain control and left arm functional use. Ambulating 150 ft x 2 with supervision. Displays decreased endurance and would benefit from OP cancer rehab at discharge to address.     Follow Up Recommendations  Outpatient PT;Supervision/Assistance - 24 hour     Equipment Recommendations  Cane    Recommendations for Other Services       Precautions / Restrictions Precautions Precautions: Fall Precaution Comments: 3 JP drains Restrictions Weight Bearing Restrictions: No    Mobility  Bed Mobility Overal bed mobility: Modified Independent                Transfers Overall transfer level: Needs assistance   Transfers: Sit to/from Stand Sit to Stand: Modified independent (Device/Increase time)            Ambulation/Gait Ambulation/Gait assistance: Supervision Gait Distance (Feet): 150 Feet(x2) Assistive device: IV Pole Gait Pattern/deviations: Step-through pattern Gait velocity: slower   General Gait Details: RUE holding onto IV pole, rounded shoulders, slower speed   Stairs             Wheelchair Mobility    Modified Rankin (Stroke Patients Only)       Balance Overall balance assessment: Needs assistance Sitting-balance support: Feet supported;No upper extremity supported Sitting balance-Leahy Scale: Good     Standing balance support: No upper  extremity supported;During functional activity Standing balance-Leahy Scale: Fair                              Cognition Arousal/Alertness: Awake/alert Behavior During Therapy: WFL for tasks assessed/performed Overall Cognitive Status: Impaired/Different from baseline Area of Impairment: Memory                     Memory: Decreased short-term memory         General Comments: pt asking questions that she previously asked and wanting clarfication multiple times during session. pt reports that granddaughter was born in June then later January.       Exercises      General Comments        Pertinent Vitals/Pain Pain Assessment: Faces Faces Pain Scale: Hurts a little bit Pain Location: surgical site Pain Descriptors / Indicators: Operative site guarding Pain Intervention(s): Monitored during session    Home Living                      Prior Function            PT Goals (current goals can now be found in the care plan section) Acute Rehab PT Goals Patient Stated Goal: to go home Potential to Achieve Goals: Good Progress towards PT goals: Progressing toward goals    Frequency    Min 3X/week      PT Plan Discharge plan needs to be updated    Co-evaluation  AM-PAC PT "6 Clicks" Mobility   Outcome Measure  Help needed turning from your back to your side while in a flat bed without using bedrails?: None Help needed moving from lying on your back to sitting on the side of a flat bed without using bedrails?: None Help needed moving to and from a bed to a chair (including a wheelchair)?: None Help needed standing up from a chair using your arms (e.g., wheelchair or bedside chair)?: None Help needed to walk in hospital room?: None Help needed climbing 3-5 steps with a railing? : A Little 6 Click Score: 23    End of Session   Activity Tolerance: Patient tolerated treatment well Patient left: in bed;with call  bell/phone within reach Nurse Communication: Mobility status PT Visit Diagnosis: Unsteadiness on feet (R26.81);Pain;Difficulty in walking, not elsewhere classified (R26.2)     Time: 8421-0312 PT Time Calculation (min) (ACUTE ONLY): 18 min  Charges:  $Therapeutic Activity: 8-22 mins                     Ellamae Sia, PT, DPT Acute Rehabilitation Services Pager [phone] Office [phone]    Willy Eddy 01/25/2019, 1:05 PM

## 2019-01-26 ENCOUNTER — Inpatient Hospital Stay (HOSPITAL_COMMUNITY): Payer: Medicare Other

## 2019-01-26 DIAGNOSIS — Z9012 Acquired absence of left breast and nipple: Secondary | ICD-10-CM

## 2019-01-26 DIAGNOSIS — E876 Hypokalemia: Secondary | ICD-10-CM

## 2019-01-26 DIAGNOSIS — G8929 Other chronic pain: Secondary | ICD-10-CM

## 2019-01-26 DIAGNOSIS — I509 Heart failure, unspecified: Secondary | ICD-10-CM

## 2019-01-26 DIAGNOSIS — Z853 Personal history of malignant neoplasm of breast: Secondary | ICD-10-CM

## 2019-01-26 DIAGNOSIS — Z872 Personal history of diseases of the skin and subcutaneous tissue: Secondary | ICD-10-CM

## 2019-01-26 DIAGNOSIS — Z7901 Long term (current) use of anticoagulants: Secondary | ICD-10-CM

## 2019-01-26 DIAGNOSIS — J449 Chronic obstructive pulmonary disease, unspecified: Secondary | ICD-10-CM

## 2019-01-26 DIAGNOSIS — Z86711 Personal history of pulmonary embolism: Secondary | ICD-10-CM

## 2019-01-26 DIAGNOSIS — M25512 Pain in left shoulder: Secondary | ICD-10-CM

## 2019-01-26 DIAGNOSIS — L042 Acute lymphadenitis of upper limb: Secondary | ICD-10-CM

## 2019-01-26 DIAGNOSIS — R509 Fever, unspecified: Secondary | ICD-10-CM

## 2019-01-26 DIAGNOSIS — I110 Hypertensive heart disease with heart failure: Secondary | ICD-10-CM

## 2019-01-26 DIAGNOSIS — F17210 Nicotine dependence, cigarettes, uncomplicated: Secondary | ICD-10-CM

## 2019-01-26 DIAGNOSIS — Z952 Presence of prosthetic heart valve: Secondary | ICD-10-CM

## 2019-01-26 DIAGNOSIS — Z885 Allergy status to narcotic agent status: Secondary | ICD-10-CM

## 2019-01-26 DIAGNOSIS — Z923 Personal history of irradiation: Secondary | ICD-10-CM

## 2019-01-26 LAB — BASIC METABOLIC PANEL
Anion gap: 12 (ref 5–15)
BUN: 7 mg/dL — ABNORMAL LOW (ref 8–23)
CO2: 19 mmol/L — ABNORMAL LOW (ref 22–32)
Calcium: 8.3 mg/dL — ABNORMAL LOW (ref 8.9–10.3)
Chloride: 104 mmol/L (ref 98–111)
Creatinine, Ser: 0.65 mg/dL (ref 0.44–1.00)
GFR calc Af Amer: >60 mL/min (ref >60)
GFR calc non Af Amer: >60 mL/min (ref >60)
Glucose, Bld: 98 mg/dL (ref 70–99)
Potassium: 3.2 mmol/L — ABNORMAL LOW (ref 3.5–5.1)
Sodium: 135 mmol/L (ref 135–145)

## 2019-01-26 LAB — CBC
HCT: 27.1 % — ABNORMAL LOW (ref 36.0–46.0)
Hemoglobin: 8.3 g/dL — ABNORMAL LOW (ref 12.0–15.0)
MCH: 30.4 pg (ref 26.0–34.0)
MCHC: 30.6 g/dL (ref 30.0–36.0)
MCV: 99.3 fL (ref 80.0–100.0)
Platelets: 590 10*3/uL — ABNORMAL HIGH (ref 150–400)
RBC: 2.73 MIL/uL — ABNORMAL LOW (ref 3.87–5.11)
RDW: 13.3 % (ref 11.5–15.5)
WBC: 15.7 10*3/uL — ABNORMAL HIGH (ref 4.0–10.5)
nRBC: 0.1 % (ref 0.0–0.2)

## 2019-01-26 LAB — PROTIME-INR
INR: 2.9 — ABNORMAL HIGH (ref 0.8–1.2)
Prothrombin Time: 29.9 seconds — ABNORMAL HIGH (ref 11.4–15.2)

## 2019-01-26 LAB — CULTURE, RESPIRATORY W GRAM STAIN: Culture: NORMAL

## 2019-01-26 MED ORDER — POTASSIUM CHLORIDE 20 MEQ/15ML (10%) PO SOLN
20.0000 meq | Freq: Once | ORAL | Status: AC
  Administered 2019-01-26: 15:00:00 20 meq via ORAL
  Filled 2019-01-26: qty 15

## 2019-01-26 MED ORDER — IOHEXOL 300 MG/ML  SOLN
100.0000 mL | Freq: Once | INTRAMUSCULAR | Status: AC | PRN
  Administered 2019-01-26: 21:00:00 100 mL via INTRAVENOUS

## 2019-01-26 MED ORDER — WARFARIN SODIUM 5 MG PO TABS
5.0000 mg | ORAL_TABLET | Freq: Once | ORAL | Status: AC
  Administered 2019-01-26: 5 mg via ORAL
  Filled 2019-01-26: qty 1

## 2019-01-26 NOTE — H&P (View-Only) (Signed)
8 Days Post-Op  Subjective: No acute overnight events. Kim Anthony is post op day 8 from left latissimus muscle flap for chest wall defect after radiation damage.  Drains in place, serosanguinous fluid - 75 cc per chart over last 24 hr.  Left chest wall dressing and left back dressing changed this AM. New adaptic placed with additional KY jelly/surgilube and new ABD pad on left chest wall.  Back pain better today, per patient.  Objective: Vital signs in last 24 hours: Temp:  [97.6 F (36.4 C)-100.9 F (38.3 C)] 97.6 F (36.4 C) (08/06 0838) Pulse Rate:  [74-90] 75 (08/06 0838) Resp:  [14-16] 16 (08/06 0838) BP: (101-125)/(57-95) 125/72 (08/06 0838) SpO2:  [92 %-96 %] 95 % (08/06 0838) Last BM Date: 01/25/19  Intake/Output from previous day: 08/05 0701 - 08/06 0700 In: 1890 [P.O.:740; IV Piggyback:1150] Out: -  Intake/Output this shift: Total I/O In: -  Out: 75 [Drains:75]  General appearance: Alert, NAD, resting in bed, watching TV. Chest wall: Left sided chest wall tenderness, improving. Opening along superior aspect of latissimus flap incision. Small opening along inferior aspect of flap. Good capillary refill. Serosanguinous drainage noted. No purulence. No fluctuance noted over shoulder. Erythema not extending past previous marks. Acell in place, incorporating nicely. Chest wall dressing with serosanguinous drainage. MSK: L arm compression sleeve on.  Pulses: 2+ and symmetric. Extremities: No edema of lower extremities noted. Incision: left back incision dressing changed. Incisions approximated. No sign of dehiscence or breakdown. Dermabond in place.   3 JP drains in place. Serosanguinous fluid in bulbs.  Lab Results:  CBC    Component Value Date/Time   WBC 15.7 (H) 01/26/2019 0207   RBC 2.73 (L) 01/26/2019 0207   HGB 8.3 (L) 01/26/2019 0207   HGB 12.5 04/21/2018 1624   HCT 27.1 (L) 01/26/2019 0207   HCT 36.0 04/21/2018 1624   PLT 590 (H) 01/26/2019 0207   PLT  403 04/21/2018 1624   MCV 99.3 01/26/2019 0207   MCV 94 04/21/2018 1624   MCH 30.4 01/26/2019 0207   MCHC 30.6 01/26/2019 0207   RDW 13.3 01/26/2019 0207   RDW 11.4 (L) 04/21/2018 1624   LYMPHSABS 2.2 01/25/2019 0715   LYMPHSABS 3.0 04/21/2018 1624   MONOABS 1.6 (H) 01/25/2019 0715   EOSABS 0.3 01/25/2019 0715   EOSABS 0.2 04/21/2018 1624   BASOSABS 0.1 01/25/2019 0715   BASOSABS 0.1 04/21/2018 1624    BMET Recent Labs    01/25/19 0715 01/26/19 0207  NA 136 135  K 3.6 3.2*  CL 104 104  CO2 22 19*  GLUCOSE 129* 98  BUN 8 7*  CREATININE 0.64 0.65  CALCIUM 8.7* 8.3*   PT/INR Recent Labs    01/25/19 0715 01/26/19 0207  LABPROT 25.2* 29.9*  INR 2.3* 2.9*   ABG No results for input(s): PHART, HCO3 in the last 72 hours.  Invalid input(s): PCO2, PO2  Studies/Results: No results found.  Anti-infectives: Anti-infectives (From admission, onward)   Start     Dose/Rate Route Frequency Ordered Stop   01/25/19 1500  azithromycin (ZITHROMAX) 500 mg in sodium chloride 0.9 % 250 mL IVPB     500 mg 250 mL/hr over 60 Minutes Intravenous Every 24 hours 01/25/19 1414     01/23/19 1800  vancomycin (VANCOCIN) IVPB 1000 mg/200 mL premix     1,000 mg 200 mL/hr over 60 Minutes Intravenous Every 12 hours 01/23/19 0541     01/19/19 2200  ceFEPIme (MAXIPIME) 2 g in sodium chloride  0.9 % 100 mL IVPB     2 g 200 mL/hr over 30 Minutes Intravenous Every 8 hours 01/19/19 2120     01/19/19 2200  vancomycin (VANCOCIN) 1,500 mg in sodium chloride 0.9 % 500 mL IVPB  Status:  Discontinued     1,500 mg 250 mL/hr over 120 Minutes Intravenous Every 24 hours 01/19/19 2123 01/23/19 0541   01/19/19 1900  sulfamethoxazole-trimethoprim (BACTRIM) 291.04 mg in dextrose 5 % 250 mL IVPB  Status:  Discontinued     15 mg/kg/day  77.6 kg 268.2 mL/hr over 60 Minutes Intravenous Every 6 hours 01/19/19 1824 01/19/19 2054   01/18/19 2200  ceFAZolin (ANCEF) IVPB 2g/100 mL premix  Status:  Discontinued     2  g 200 mL/hr over 30 Minutes Intravenous Every 8 hours 01/18/19 1804 01/19/19 2042   01/18/19 1155  ceFAZolin (ANCEF) 2-4 GM/100ML-% IVPB    Note to Pharmacy: Merryl Hacker   : cabinet override      01/18/19 1155 01/18/19 2359      Assessment/Plan: s/p Procedure(s): LEFT LATISSIMUS FLAP TO BREAST, post op day 8.  No further surgery planned during this admission, Acell placement to assist with wound healing.   All 3 drains in place (3), draining serosanguinous fluid from left chest wall as well as left latissimus back incision. Will remain at this time.  Wound care performed this AM, New adaptic, additional KY jelly, new ABD pad and clean breast binder provided. Acell incoporating into wound bed. No purulent drainage noted.  Appreciate assistance and input from medicine team.     LOS: 8 days   Charlies Constable, PA-C 01/26/2019 [phone]

## 2019-01-26 NOTE — Consult Note (Signed)
Date of Admission:  01/18/2019          Reason for Consult: FUO    Referring Provider:    Assessment:  1. Hx of breast and axillary abscesses sp I  And D by General surgery and flap placement by plastics who had originally  2. Breast cancer 18 years ago with mastectomy and axillary LN dissection 3. Ongoing fevers despite broad spectrum antibiotics  4. Mechanical Aortic valve  Plan:  1. CT chest with contrast 2. Patient will likely need further surgery 3. Please send intra-operative cultures 4. DC azithromycin  Principal Problem:   Fever Active Problems:   Open wound of chest wall, left, initial encounter   Sepsis ([place])   Tachycardia   Scheduled Meds: . dextromethorphan-guaiFENesin  1 tablet Oral BID  . furosemide  40 mg Oral Daily  . gabapentin  600 mg Oral BID  . metoprolol succinate  12.5 mg Oral QHS  . potassium chloride  20 mEq Oral Daily  . senna  1 tablet Oral BID  . simvastatin  20 mg Oral Daily  . umeclidinium-vilanterol  1 puff Inhalation Daily  . warfarin  5 mg Oral ONCE-1800  . Warfarin - Pharmacist Dosing Inpatient   Does not apply q1800   Continuous Infusions: . ceFEPime (MAXIPIME) IV 2 g (01/26/19 1517)  . vancomycin 1,000 mg (01/26/19 0524)   PRN Meds:.acetaminophen, albuterol, bisacodyl, diazepam, diphenhydrAMINE **OR** diphenhydrAMINE, fluticasone, HYDROcodone-acetaminophen, HYDROmorphone (DILAUDID) injection, morphine injection, ondansetron **OR** ondansetron (ZOFRAN) IV, oxyCODONE-acetaminophen, polyethylene glycol, sodium chloride flush  HPI: Kim Anthony is a 68 y.o. female  with history of hypertension, COPD, mechanical heart valve, pulmonary embolism on Coumadin, CHF (EF 50 to 55%), COPD, and left breast cancer, who had undergone a left  mastectomy and complete axillary dissection in [place] approximately 18 years ago followed by radiation therapy and anti-hormonal therapy was doing well from a breast standpoint (no longer sees  oncologist) until 6 months ago, she developed an abscess and draining wound in her left axilla at the very lateral edge of her mastectomy incision. She was on multiple antibiotics without improvement. She also has left shoulder pain. She has chronic pain with limited mobility of the shoulder. She was seen by general surgery Dr. Ninfa Linden on July 7 and there was concern for deep infection (CT scan 12/19 was unremarkable regarding the chest wall and axilla but an ultrasound January 25 digit showed  ill-defined 3.8 x 3.8 cm area likely representing fluid collections) and patient was planned for OR intervention after discussing with plastic surgery regarding a possible muscle flap.  Post excision, patient presented to the ED on July 19 due to opening of the wound and was seen by plastic surgery on 7/20 for follow-up at which time latissimus muscle flap was recommended.  She underwent this procedure (latissimus flap to the left breast) on July 29 subsequent to which she developed fever 103F with tachycardia and some hypotension with SBP dipping to 90s.  Patient was admitted to 4 N. under hospitalist service and was started on vancomycin/cefepime for sepsis syndrome.  CTA of the chest did not show any PE but reported extensive subcutaneous edema and inflammatory change involving the left chest wall anteriorly and laterally, extending to the presternal soft tissues, consistent with soft tissue infection as well as numerous gas and fluid collections.  She has been on broad-spectrum antibiotics but not had improvement in her fevers.  Plastics has been following her and changing her wounds.  On  exam she is quite tender in her axillary area where she had prior abscess.  It seems to me there should be little doubt that she still has residual infection that requires further surgery.  I am going to get a CT scan to further elucidate this.  I do not think Legionella infection is likely with her and I am discontinue  azithromycin.  If she goes operating room please check intraoperative cultures to help guide therapy.     Review of Systems: Review of Systems  Constitutional: Negative for chills, diaphoresis, fever, malaise/fatigue and weight loss.  HENT: Negative for congestion, hearing loss, sore throat and tinnitus.   Eyes: Negative for blurred vision and double vision.  Respiratory: Negative for cough, sputum production, shortness of breath and wheezing.   Cardiovascular: Negative for chest pain, palpitations and leg swelling.  Gastrointestinal: Negative for abdominal pain, blood in stool, constipation, diarrhea, heartburn, melena, nausea and vomiting.  Genitourinary: Negative for dysuria, flank pain and hematuria.  Musculoskeletal: Positive for myalgias. Negative for back pain, falls and joint pain.  Skin: Negative for itching and rash.  Neurological: Negative for dizziness, sensory change, focal weakness, loss of consciousness, weakness and headaches.  Endo/Heme/Allergies: Does not bruise/bleed easily.  Psychiatric/Behavioral: Negative for depression, memory loss and suicidal ideas. The patient is not nervous/anxious.     Past Medical History:  Diagnosis Date  . Arthritis   . Asthma   . Breast cancer ([place])    Left Breast Cancer  . CHF (congestive heart failure) ([place])   . COPD (chronic obstructive pulmonary disease) ([place])   . Coronary artery disease   . History of mitral valve replacement with mechanical valve   . HLD (hyperlipidemia)   . Hypertension   . Pulmonary embolism ([facility])     Social History   Tobacco Use  . Smoking status: Current Every Day Smoker    Packs/day: 0.25    Years: 30.00    Pack years: 7.50    Types: Cigarettes  . Smokeless tobacco: Never Used  . Tobacco comment: Sometimes less.  Substance Use Topics  . Alcohol use: Yes    Comment: beer- ocasional  . Drug use: No    Family History  Problem Relation Age of Onset  . Heart attack Other   . Breast cancer  Maternal Aunt 50   Allergies  Allergen Reactions  . Acetaminophen-Codeine Itching  . Propoxyphene Itching    Darvocet  . Tramadol Itching and Nausea And Vomiting    OBJECTIVE: Blood pressure 131/87, pulse 77, temperature 97.6 F (36.4 C), temperature source Oral, resp. rate 19, height 5\' 5"  (1.651 m), weight 77.6 kg, SpO2 96 %.  Physical Exam Constitutional:      General: She is not in acute distress.    Appearance: She is well-developed. She is not diaphoretic.  HENT:     Head: Normocephalic and atraumatic.     Mouth/Throat:     Pharynx: No oropharyngeal exudate.  Eyes:     General: No scleral icterus.    Conjunctiva/sclera: Conjunctivae normal.  Neck:     Musculoskeletal: Normal range of motion and neck supple.  Cardiovascular:     Rate and Rhythm: Normal rate and regular rhythm.  Pulmonary:     Effort: Pulmonary effort is normal. No respiratory distress.     Breath sounds: No wheezing.  Abdominal:     General: There is no distension.  Musculoskeletal:        General: No tenderness.  Skin:    General: Skin  is warm and dry.     Coloration: Skin is not pale.     Findings: No erythema or rash.  Neurological:     General: No focal deficit present.     Mental Status: She is alert and oriented to person, place, and time.     Motor: No abnormal muscle tone.     Coordination: Coordination normal.  Psychiatric:        Mood and Affect: Mood normal.        Behavior: Behavior normal.        Thought Content: Thought content normal.        Judgment: Judgment normal.    Left breast and axilla 01/26/2019:       Lab Results Lab Results  Component Value Date   WBC 15.7 (H) 01/26/2019   HGB 8.3 (L) 01/26/2019   HCT 27.1 (L) 01/26/2019   MCV 99.3 01/26/2019   PLT 590 (H) 01/26/2019    Lab Results  Component Value Date   CREATININE 0.65 01/26/2019   BUN 7 (L) 01/26/2019   NA 135 01/26/2019   K 3.2 (L) 01/26/2019   CL 104 01/26/2019   CO2 19 (L) 01/26/2019     Lab Results  Component Value Date   ALT 13 01/20/2019   AST 24 01/20/2019   ALKPHOS 59 01/20/2019   BILITOT 0.7 01/20/2019     Microbiology: Recent Results (from the past 240 hour(s))  Culture, blood (routine x 2)     Status: None   Collection Time: 01/19/19  9:41 PM   Specimen: BLOOD  Result Value Ref Range Status   Specimen Description BLOOD RIGHT WRIST  Final   Special Requests   Final    BOTTLES DRAWN AEROBIC ONLY Blood Culture adequate volume   Culture   Final    NO GROWTH 5 DAYS Performed at [facility], [address], [place], [place] [address]    Report Status 01/24/2019 FINAL  Final  Culture, blood (routine x 2)     Status: None   Collection Time: 01/19/19  9:41 PM   Specimen: BLOOD  Result Value Ref Range Status   Specimen Description BLOOD RIGHT HAND  Final   Special Requests   Final    BOTTLES DRAWN AEROBIC ONLY Blood Culture adequate volume   Culture   Final    NO GROWTH 5 DAYS Performed at [facility], [place] [address], [place], [place] [address]    Report Status 01/24/2019 FINAL  Final  Culture, blood (routine x 2)     Status: None (Preliminary result)   Collection Time: 01/23/19 12:31 PM   Specimen: BLOOD  Result Value Ref Range Status   Specimen Description BLOOD BLOOD LEFT HAND  Final   Special Requests AEROBIC BOTTLE ONLY Blood Culture adequate volume  Final   Culture   Final    NO GROWTH 3 DAYS Performed at [facility], [place] [address] [place], [place] [address]    Report Status PENDING  Incomplete  Culture, blood (routine x 2)     Status: None (Preliminary result)   Collection Time: 01/23/19 12:31 PM   Specimen: BLOOD  Result Value Ref Range Status   Specimen Description BLOOD BLOOD LEFT HAND  Final   Special Requests AEROBIC BOTTLE ONLY Blood Culture adequate volume  Final   Culture   Final    NO GROWTH 3 DAYS Performed at [facility], [place] [address] [place],  [address]  Report Status PENDING   Incomplete  Expectorated sputum assessment w rflx to resp cult     Status: None   Collection Time: 01/24/19  5:51 AM   Specimen: Sputum  Result Value Ref Range Status   Specimen Description SPUTUM  Final   Special Requests NONE  Final   Sputum evaluation   Final    THIS SPECIMEN IS ACCEPTABLE FOR SPUTUM CULTURE Performed at [facility], [address] [place], [place] [address]    Report Status 01/24/2019 FINAL  Final  Culture, respiratory     Status: None   Collection Time: 01/24/19  5:51 AM   Specimen: SPU  Result Value Ref Range Status   Specimen Description SPUTUM  Final   Special Requests NONE Reflexed from [id number]  Final   Gram Stain   Final    FEW WBC PRESENT, PREDOMINANTLY PMN ABUNDANT GRAM NEGATIVE RODS ABUNDANT GRAM POSITIVE COCCI IN PAIRS IN CLUSTERS FEW GRAM POSITIVE RODS    Culture   Final    MODERATE Consistent with normal respiratory flora. Performed at [facility], [place] [address], [place], [place] [address]    Report Status 01/26/2019 FINAL  Final    Alcide Evener, [place] [facility] [phone] pager  01/26/2019, 3:21 PM

## 2019-01-26 NOTE — Progress Notes (Addendum)
ANTICOAGULATION CONSULT NOTE - Follow Up Consult  Pharmacy Consult for Coumadin Indication:  mech MV and chronic PE  Allergies  Allergen Reactions  . Acetaminophen-Codeine Itching  . Propoxyphene Itching    Darvocet  . Tramadol Itching and Nausea And Vomiting    Patient Measurements: Height: 5\' 5"  (165.1 cm) Weight: 171 lb (77.6 kg) IBW/kg (Calculated) : 57  Vital Signs: Temp: 97.6 F (36.4 C) (08/06 1116) Temp Source: Oral (08/06 1116) BP: 131/87 (08/06 1116) Pulse Rate: 77 (08/06 1116)  Labs: Recent Labs    01/24/19 0842 01/25/19 0715 01/26/19 0207  HGB 8.3* 8.5* 8.3*  HCT 26.0* 27.3* 27.1*  PLT 480* 614* 590*  LABPROT 20.6* 25.2* 29.9*  INR 1.8* 2.3* 2.9*  CREATININE 0.76 0.64 0.65    Estimated Creatinine Clearance: 68.3 mL/min (by C-G formula based on SCr of 0.65 mg/dL).   Assessment: 67 yof on Lovenox and Warfarin for hx mechanical MVR and chronic PE. UE dopplers negative for DVT and CTA negative for acute PE this admit.  PTA Warfarin: 5 mg daily except 7.5 mg on Wed and Sun. Last dose 7/23. INR was 3 that day.   INR up to 2.3>2.9 today, therapeutic. H&H stable. No acute bleed issues documented. No further surgeries needed per Plastic Surgery note.   Noted DDI - MD added azithromycin 8/5 and check legionella - likely will cause INR to increase.  Goal of Therapy:  INR 2.5-3.5 Monitor platelets by anticoagulation protocol: Yes   Plan:  D/c Lovenox - INR is therapeutic and likely to trend up further on azithromycin Warfarin 5 mg PO x 1 - watch INR trend, may need to decrease dose while on azithro Monitor daily INR, CBC, s/sx bleeding, DDI  Elicia Lamp, PharmD, BCPS Please check AMION for all [place] contact numbers Clinical Pharmacist 01/26/2019 1:29 PM

## 2019-01-26 NOTE — Progress Notes (Signed)
8 Days Post-Op  Subjective: No acute overnight events. Kim Anthony is post op day 8 from left latissimus muscle flap for chest wall defect after radiation damage.  Drains in place, serosanguinous fluid - 75 cc per chart over last 24 hr.  Left chest wall dressing and left back dressing changed this AM. New adaptic placed with additional KY jelly/surgilube and new ABD pad on left chest wall.  Back pain better today, per patient.  Objective: Vital signs in last 24 hours: Temp:  [97.6 F (36.4 C)-100.9 F (38.3 C)] 97.6 F (36.4 C) (08/06 0838) Pulse Rate:  [74-90] 75 (08/06 0838) Resp:  [14-16] 16 (08/06 0838) BP: (101-125)/(57-95) 125/72 (08/06 0838) SpO2:  [92 %-96 %] 95 % (08/06 0838) Last BM Date: 01/25/19  Intake/Output from previous day: 08/05 0701 - 08/06 0700 In: 1890 [P.O.:740; IV Piggyback:1150] Out: -  Intake/Output this shift: Total I/O In: -  Out: 75 [Drains:75]  General appearance: Alert, NAD, resting in bed, watching TV. Chest wall: Left sided chest wall tenderness, improving. Opening along superior aspect of latissimus flap incision. Small opening along inferior aspect of flap. Good capillary refill. Serosanguinous drainage noted. No purulence. No fluctuance noted over shoulder. Erythema not extending past previous marks. Acell in place, incorporating nicely. Chest wall dressing with serosanguinous drainage. MSK: L arm compression sleeve on.  Pulses: 2+ and symmetric. Extremities: No edema of lower extremities noted. Incision: left back incision dressing changed. Incisions approximated. No sign of dehiscence or breakdown. Dermabond in place.   3 JP drains in place. Serosanguinous fluid in bulbs.  Lab Results:  CBC    Component Value Date/Time   WBC 15.7 (H) 01/26/2019 0207   RBC 2.73 (L) 01/26/2019 0207   HGB 8.3 (L) 01/26/2019 0207   HGB 12.5 04/21/2018 1624   HCT 27.1 (L) 01/26/2019 0207   HCT 36.0 04/21/2018 1624   PLT 590 (H) 01/26/2019 0207   PLT  403 04/21/2018 1624   MCV 99.3 01/26/2019 0207   MCV 94 04/21/2018 1624   MCH 30.4 01/26/2019 0207   MCHC 30.6 01/26/2019 0207   RDW 13.3 01/26/2019 0207   RDW 11.4 (L) 04/21/2018 1624   LYMPHSABS 2.2 01/25/2019 0715   LYMPHSABS 3.0 04/21/2018 1624   MONOABS 1.6 (H) 01/25/2019 0715   EOSABS 0.3 01/25/2019 0715   EOSABS 0.2 04/21/2018 1624   BASOSABS 0.1 01/25/2019 0715   BASOSABS 0.1 04/21/2018 1624    BMET Recent Labs    01/25/19 0715 01/26/19 0207  NA 136 135  K 3.6 3.2*  CL 104 104  CO2 22 19*  GLUCOSE 129* 98  BUN 8 7*  CREATININE 0.64 0.65  CALCIUM 8.7* 8.3*   PT/INR Recent Labs    01/25/19 0715 01/26/19 0207  LABPROT 25.2* 29.9*  INR 2.3* 2.9*   ABG No results for input(s): PHART, HCO3 in the last 72 hours.  Invalid input(s): PCO2, PO2  Studies/Results: No results found.  Anti-infectives: Anti-infectives (From admission, onward)   Start     Dose/Rate Route Frequency Ordered Stop   01/25/19 1500  azithromycin (ZITHROMAX) 500 mg in sodium chloride 0.9 % 250 mL IVPB     500 mg 250 mL/hr over 60 Minutes Intravenous Every 24 hours 01/25/19 1414     01/23/19 1800  vancomycin (VANCOCIN) IVPB 1000 mg/200 mL premix     1,000 mg 200 mL/hr over 60 Minutes Intravenous Every 12 hours 01/23/19 0541     01/19/19 2200  ceFEPIme (MAXIPIME) 2 g in sodium chloride  0.9 % 100 mL IVPB     2 g 200 mL/hr over 30 Minutes Intravenous Every 8 hours 01/19/19 2120     01/19/19 2200  vancomycin (VANCOCIN) 1,500 mg in sodium chloride 0.9 % 500 mL IVPB  Status:  Discontinued     1,500 mg 250 mL/hr over 120 Minutes Intravenous Every 24 hours 01/19/19 2123 01/23/19 0541   01/19/19 1900  sulfamethoxazole-trimethoprim (BACTRIM) 291.04 mg in dextrose 5 % 250 mL IVPB  Status:  Discontinued     15 mg/kg/day  77.6 kg 268.2 mL/hr over 60 Minutes Intravenous Every 6 hours 01/19/19 1824 01/19/19 2054   01/18/19 2200  ceFAZolin (ANCEF) IVPB 2g/100 mL premix  Status:  Discontinued     2  g 200 mL/hr over 30 Minutes Intravenous Every 8 hours 01/18/19 1804 01/19/19 2042   01/18/19 1155  ceFAZolin (ANCEF) 2-4 GM/100ML-% IVPB    Note to Pharmacy: Merryl Hacker   : cabinet override      01/18/19 1155 01/18/19 2359      Assessment/Plan: s/p Procedure(s): LEFT LATISSIMUS FLAP TO BREAST, post op day 8.  No further surgery planned during this admission, Acell placement to assist with wound healing.   All 3 drains in place (3), draining serosanguinous fluid from left chest wall as well as left latissimus back incision. Will remain at this time.  Wound care performed this AM, New adaptic, additional KY jelly, new ABD pad and clean breast binder provided. Acell incoporating into wound bed. No purulent drainage noted.  Appreciate assistance and input from medicine team.     LOS: 8 days   Charlies Constable, PA-C 01/26/2019 [phone]

## 2019-01-26 NOTE — Progress Notes (Signed)
PROGRESS NOTE    Kim Anthony  JQZ:[id number]  DOB: 06-01-1951  DOA: 01/18/2019 PCP: Marianna Payment, MD  Brief Narrative:   68 year old female with history of hypertension, COPD, mechanical heart valve, pulmonary embolism on Coumadin, CHF (EF 74 to 55%), COPD, and left breast cancer, who had undergone a left  mastectomy and complete axillary dissection in [place], [place] approximately 18 years ago followed by radiation therapy and anti-hormonal therapy was doing well from a breast standpoint (no longer sees oncologist) until 6 months ago, she developed an abscess and draining wound in her left axilla at the very lateral edge of her mastectomy incision. She was on multiple antibiotics without improvement. She also has left shoulder pain. She has chronic pain with limited mobility of the shoulder. She was seen by general surgery Dr. Ninfa Linden on July 7 and there was concern for deep infection (CT scan 12/19 was unremarkable regarding the chest wall and axilla but an ultrasound January 25 digit showed  ill-defined 3.8 x 3.8 cm area likely representing fluid collections) and patient was planned for OR intervention after discussing with plastic surgery regarding a possible muscle flap.  Post excision, patient presented to the ED on July 19 due to opening of the wound and was seen by plastic surgery on 7/20 for follow-up at which time latissimus muscle flap was recommended.  She underwent this procedure (latissimus flap to the left breast) on July 29 subsequent to which she developed fever 1015F with tachycardia and some hypotension with SBP dipping to 90s.  Patient was admitted to 4 N. under hospitalist service and was started on vancomycin/cefepime for sepsis syndrome.  CTA of the chest did not show any PE but reported extensive subcutaneous edema and inflammatory change involving the left chest wall anteriorly and laterally, extending to the presternal soft tissues, consistent with soft tissue infection as well  as numerous gas and fluid collections within the left anterior chest wall soft tissues, concerning for soft tissue abscess or necrosis.pharmacy has been managing Coumadin and Plastic surgery has been following along for local wound care/dressing changes and drain management. Patient has had leukocytosis as well as intermittent temp spikes during the hospital course (most significantly 1015F on 7/31, 102.15F on 8/3, 100.7 on 8/4 and 100.9 on 8/5).  Azithromycin was added to vancomycin/cefepime yesterday (8/5).  Her last COVID testing was on 7/27 which was negative.  Subjective:  Patient seen by plastic surgery today.  States pain intermittently uncontrolled but she is holding up well.  T-max of 100.9 in the last 24 hours.  Objective: Vitals:   01/25/19 1940 01/25/19 2212 01/26/19 0838 01/26/19 1116  BP: (!) 108/95 (!) 101/57 125/72 131/87  Pulse: 90 74 75 77  Resp: 14  16 19   Temp: (!) 100.9 F (38.3 C)  97.6 F (36.4 C) 97.6 F (36.4 C)  TempSrc: Oral  Oral Oral  SpO2: 96%  95% 96%  Weight:      Height:        Intake/Output Summary (Last 24 hours) at 01/26/2019 1419 Last data filed at 01/26/2019 0803 Gross per 24 hour  Intake 1550 ml  Output 75 ml  Net 1475 ml   Filed Weights   01/18/19 1900  Weight: 77.6 kg    Physical Examination:  General exam: Appears calm and comfortable  Respiratory system: Clear to auscultation. Respiratory effort normal. Cardiovascular system: S1 & S2 heard, RRR. No JVD, murmurs, rubs, gallops or clicks. No pedal edema. Gastrointestinal system: Abdomen is nondistended, soft and  nontender. No organomegaly or masses felt. Normal bowel sounds heard. Central nervous system: Alert and oriented. No focal neurological deficits. Extremities: Symmetric 5 x 5 power. Skin: Left back incision dressing/anterior chest wall dressing/breast binder with drains in place. Psychiatry: Judgement and insight appear normal. Mood & affect appropriate.     Data Reviewed: I  have personally reviewed following labs and imaging studies  CBC: Recent Labs  Lab 01/20/19 0038  01/22/19 0745 01/23/19 0402 01/24/19 0842 01/25/19 0715 01/26/19 0207  WBC 24.4*   < > 15.5* 12.2* 12.9* 15.8* 15.7*  NEUTROABS 18.0*  --  11.6* 8.3* 9.7* 11.1*  --   HGB 9.1*   < > 8.6* 9.3* 8.3* 8.5* 8.3*  HCT 28.9*   < > 27.6* 30.0* 26.0* 27.3* 27.1*  MCV 100.0   < > 97.9 97.4 95.6 97.2 99.3  PLT 421*   < > 412* 369 480* 614* 590*   < > = values in this interval not displayed.   Basic Metabolic Panel: Recent Labs  Lab 01/22/19 0745 01/23/19 0402 01/24/19 0842 01/25/19 0715 01/26/19 0207  NA 134* 132* 134* 136 135  K 3.9 3.6 2.9* 3.6 3.2*  CL 103 102 102 104 104  CO2 18* 19* 20* 22 19*  GLUCOSE 104* 117* 154* 129* 98  BUN 9 11 9 8  7*  CREATININE 0.63 0.73 0.76 0.64 0.65  CALCIUM 8.5* 8.3* 8.5* 8.7* 8.3*  MG  --   --  1.7  --   --   PHOS  --   --  2.4*  --   --    GFR: Estimated Creatinine Clearance: 68.3 mL/min (by C-G formula based on SCr of 0.65 mg/dL). Liver Function Tests: Recent Labs  Lab 01/20/19 0038  AST 24  ALT 13  ALKPHOS 59  BILITOT 0.7  PROT 5.6*  ALBUMIN 2.2*   No results for input(s): LIPASE, AMYLASE in the last 168 hours. No results for input(s): AMMONIA in the last 168 hours. Coagulation Profile: Recent Labs  Lab 01/22/19 0745 01/23/19 0402 01/24/19 0842 01/25/19 0715 01/26/19 0207  INR 1.4* 1.5* 1.8* 2.3* 2.9*   Cardiac Enzymes: No results for input(s): CKTOTAL, CKMB, CKMBINDEX, TROPONINI in the last 168 hours. BNP (last 3 results) No results for input(s): PROBNP in the last 8760 hours. HbA1C: No results for input(s): HGBA1C in the last 72 hours. CBG: No results for input(s): GLUCAP in the last 168 hours. Lipid Profile: No results for input(s): CHOL, HDL, LDLCALC, TRIG, CHOLHDL, LDLDIRECT in the last 72 hours. Thyroid Function Tests: No results for input(s): TSH, T4TOTAL, FREET4, T3FREE, THYROIDAB in the last 72 hours. Anemia  Panel: No results for input(s): VITAMINB12, FOLATE, FERRITIN, TIBC, IRON, RETICCTPCT in the last 72 hours. Sepsis Labs: Recent Labs  Lab 01/19/19 1909 01/19/19 2141  LATICACIDVEN 1.8 1.6    Recent Results (from the past 240 hour(s))  Culture, blood (routine x 2)     Status: None   Collection Time: 01/19/19  9:41 PM   Specimen: BLOOD  Result Value Ref Range Status   Specimen Description BLOOD RIGHT WRIST  Final   Special Requests   Final    BOTTLES DRAWN AEROBIC ONLY Blood Culture adequate volume   Culture   Final    NO GROWTH 5 DAYS Performed at [facility], [address], [place], [place] [address]    Report Status 01/24/2019 FINAL  Final  Culture, blood (routine x 2)     Status: None   Collection Time:  01/19/19  9:41 PM   Specimen: BLOOD  Result Value Ref Range Status   Specimen Description BLOOD RIGHT HAND  Final   Special Requests   Final    BOTTLES DRAWN AEROBIC ONLY Blood Culture adequate volume   Culture   Final    NO GROWTH 5 DAYS Performed at [facility], [address], [place], [place] [address]    Report Status 01/24/2019 FINAL  Final  Culture, blood (routine x 2)     Status: None (Preliminary result)   Collection Time: 01/23/19 12:31 PM   Specimen: BLOOD  Result Value Ref Range Status   Specimen Description BLOOD BLOOD LEFT HAND  Final   Special Requests AEROBIC BOTTLE ONLY Blood Culture adequate volume  Final   Culture   Final    NO GROWTH 2 DAYS Performed at [facility], [place] [address] [place], [place] [address]    Report Status PENDING  Incomplete  Culture, blood (routine x 2)     Status: None (Preliminary result)   Collection Time: 01/23/19 12:31 PM   Specimen: BLOOD  Result Value Ref Range Status   Specimen Description BLOOD BLOOD LEFT HAND  Final   Special Requests AEROBIC BOTTLE ONLY Blood Culture adequate volume  Final   Culture   Final    NO GROWTH 2 DAYS Performed at [facility], [place] [address]  [place], [place] [address]    Report Status PENDING  Incomplete  Expectorated sputum assessment w rflx to resp cult     Status: None   Collection Time: 01/24/19  5:51 AM   Specimen: Sputum  Result Value Ref Range Status   Specimen Description SPUTUM  Final   Special Requests NONE  Final   Sputum evaluation   Final    THIS SPECIMEN IS ACCEPTABLE FOR SPUTUM CULTURE Performed at [facility], [place] [address] [place], [place] [address]    Report Status 01/24/2019 FINAL  Final  Culture, respiratory     Status: None   Collection Time: 01/24/19  5:51 AM   Specimen: SPU  Result Value Ref Range Status   Specimen Description SPUTUM  Final   Special Requests NONE Reflexed from [id number]  Final   Gram Stain   Final    FEW WBC PRESENT, PREDOMINANTLY PMN ABUNDANT GRAM NEGATIVE RODS ABUNDANT GRAM POSITIVE COCCI IN PAIRS IN CLUSTERS FEW GRAM POSITIVE RODS    Culture   Final    MODERATE Consistent with normal respiratory flora. Performed at [facility], [place] [address], [place], [place] [address]    Report Status 01/26/2019 FINAL  Final      Radiology Studies: No results found.      Scheduled Meds: . dextromethorphan-guaiFENesin  1 tablet Oral BID  . furosemide  40 mg Oral Daily  . gabapentin  600 mg Oral BID  . metoprolol succinate  12.5 mg Oral QHS  . potassium chloride  20 mEq Oral Daily  . senna  1 tablet Oral BID  . simvastatin  20 mg Oral Daily  . umeclidinium-vilanterol  1 puff Inhalation Daily  . warfarin  5 mg Oral ONCE-1800  . Warfarin - Pharmacist Dosing Inpatient   Does not apply q1800   Continuous Infusions: . azithromycin 500 mg (01/25/19 1548)  . ceFEPime (MAXIPIME) IV 2 g (01/26/19 0518)  . vancomycin 1,000 mg (01/26/19 0524)    Assessment & Plan:    1.  Sepsis secondary to postoperative wound infection: Present on  admission.Patient continues to have white count (peaked to 21 K during the hospital course, today at 15 K) and intermittent temperature  spikes.  CT scan on admission with findings as described above.   Plastic surgery following along.  They perform wound care today with ACell/KY jelly (please refer to their note for details).  Per their note, all 3 drains were to remain in place and are draining serosanguineous fluid from left chest wall/left latissimus back incision.   Given complexity of her infection in the setting of mechanical heart valve, I have consulted infectious diseases for direction on antibiotics.  Currently on IV vancomycin/cefepime and azithromycin.  Not sure if azithromycin use warranted (also concerned about interference with Coumadin level).  Discussed with Dr. Tommy Medal who will evaluate patient and advise further.  From 7/30 and 8/3 negative so far.  2.  History of PE/mechanical heart valve: Continue anticoagulation with goal 2.5-3.5.  Appreciate pharmacy assistance.  3.  COPD/emphysema: Stable currently without any wheezing.  Saturating well on room air.  CT chest did show emphysematous changes and fibrotic changes along left upper lobe anterior and apical fibrosis, No change in 6 mm right apical lung nodule and right axillary/mediastinal reactive lymphadenopathy.  MDI/nebs as needed  4.  Breast cancer: Status post mastectomy/radiation/hormone treatment many years back.  No longer sees an oncologist.  Most recent CT scans suggest reactive lymphadenopathy and she does have significant wound infection.  5.  Chronic left shoulder pain: Could be related to wound infection.  Pain meds as needed.  X-ray of the left shoulder was negative for any acute issues in January.  6.  Hypokalemia: Replace.  Now on 20 mEq daily.  BMP in a.m.   DVT prophylaxis: On warfarin INR greater than 2 Code Status: Full code Family / Patient Communication: Discussed with patient in detail.  Discussed with infectious diseases Disposition Plan: TBD     LOS: 8 days    Time spent: 103 minutes    Guilford Shi, MD [facility]  Pager [phone]  If 7PM-7AM, please contact night-coverage [email or web address] Password [email or web address] 01/26/2019, 2:19 PM

## 2019-01-27 ENCOUNTER — Inpatient Hospital Stay (HOSPITAL_COMMUNITY): Payer: Medicare Other | Admitting: Certified Registered"

## 2019-01-27 ENCOUNTER — Encounter (HOSPITAL_COMMUNITY)
Admission: RE | Disposition: A | Discharge status: Home Health | Payer: Self-pay | Source: Home / Self Care | Attending: Internal Medicine

## 2019-01-27 ENCOUNTER — Encounter (HOSPITAL_COMMUNITY): Payer: Self-pay

## 2019-01-27 ENCOUNTER — Ambulatory Visit: Payer: Medicare Other | Admitting: Surgical

## 2019-01-27 DIAGNOSIS — T8131XA Disruption of external operation (surgical) wound, not elsewhere classified, initial encounter: Secondary | ICD-10-CM

## 2019-01-27 DIAGNOSIS — S21002A Unspecified open wound of left breast, initial encounter: Secondary | ICD-10-CM

## 2019-01-27 DIAGNOSIS — S21102A Unspecified open wound of left front wall of thorax without penetration into thoracic cavity, initial encounter: Secondary | ICD-10-CM

## 2019-01-27 HISTORY — PX: APPLICATION OF A-CELL OF CHEST/ABDOMEN: SHX6302

## 2019-01-27 HISTORY — PX: APPLICATION OF WOUND VAC: SHX5189

## 2019-01-27 HISTORY — PX: INCISION AND DRAINAGE OF WOUND: SHX1803

## 2019-01-27 LAB — CBC
HCT: 26.6 % — ABNORMAL LOW (ref 36.0–46.0)
Hemoglobin: 8.1 g/dL — ABNORMAL LOW (ref 12.0–15.0)
MCH: 29.9 pg (ref 26.0–34.0)
MCHC: 30.5 g/dL (ref 30.0–36.0)
MCV: 98.2 fL (ref 80.0–100.0)
Platelets: 666 10*3/uL — ABNORMAL HIGH (ref 150–400)
RBC: 2.71 MIL/uL — ABNORMAL LOW (ref 3.87–5.11)
RDW: 13.3 % (ref 11.5–15.5)
WBC: 13.6 10*3/uL — ABNORMAL HIGH (ref 4.0–10.5)
nRBC: 0.1 % (ref 0.0–0.2)

## 2019-01-27 LAB — BASIC METABOLIC PANEL
Anion gap: 10 (ref 5–15)
BUN: 7 mg/dL — ABNORMAL LOW (ref 8–23)
CO2: 19 mmol/L — ABNORMAL LOW (ref 22–32)
Calcium: 8.5 mg/dL — ABNORMAL LOW (ref 8.9–10.3)
Chloride: 107 mmol/L (ref 98–111)
Creatinine, Ser: 0.8 mg/dL (ref 0.44–1.00)
GFR calc Af Amer: >60 mL/min (ref >60)
GFR calc non Af Amer: >60 mL/min (ref >60)
Glucose, Bld: 130 mg/dL — ABNORMAL HIGH (ref 70–99)
Potassium: 3.6 mmol/L (ref 3.5–5.1)
Sodium: 136 mmol/L (ref 135–145)

## 2019-01-27 LAB — PROTIME-INR
INR: 3.4 — ABNORMAL HIGH (ref 0.8–1.2)
Prothrombin Time: 33.5 seconds — ABNORMAL HIGH (ref 11.4–15.2)

## 2019-01-27 LAB — SURGICAL PCR SCREEN
MRSA, PCR: NEGATIVE
Staphylococcus aureus: NEGATIVE

## 2019-01-27 SURGERY — APPLICATION OF A-CELL OF CHEST/ABDOMEN
Anesthesia: General | Site: Chest

## 2019-01-27 MED ORDER — SUCCINYLCHOLINE CHLORIDE 200 MG/10ML IV SOSY
PREFILLED_SYRINGE | INTRAVENOUS | Status: AC
  Filled 2019-01-27: qty 20

## 2019-01-27 MED ORDER — PHENYLEPHRINE 40 MCG/ML (10ML) SYRINGE FOR IV PUSH (FOR BLOOD PRESSURE SUPPORT)
PREFILLED_SYRINGE | INTRAVENOUS | Status: AC
  Filled 2019-01-27: qty 10

## 2019-01-27 MED ORDER — BACITRACIN ZINC 500 UNIT/GM EX OINT
TOPICAL_OINTMENT | CUTANEOUS | Status: AC
  Filled 2019-01-27: qty 28.35

## 2019-01-27 MED ORDER — LIDOCAINE HCL (CARDIAC) PF 100 MG/5ML IV SOSY
PREFILLED_SYRINGE | INTRAVENOUS | Status: DC | PRN
  Administered 2019-01-27: 60 mg via INTRATRACHEAL

## 2019-01-27 MED ORDER — PHENYLEPHRINE 40 MCG/ML (10ML) SYRINGE FOR IV PUSH (FOR BLOOD PRESSURE SUPPORT)
PREFILLED_SYRINGE | INTRAVENOUS | Status: AC
  Filled 2019-01-27: qty 20

## 2019-01-27 MED ORDER — MIDAZOLAM HCL 5 MG/5ML IJ SOLN
INTRAMUSCULAR | Status: DC | PRN
  Administered 2019-01-27 (×2): 1 mg via INTRAVENOUS

## 2019-01-27 MED ORDER — FENTANYL CITRATE (PF) 250 MCG/5ML IJ SOLN
INTRAMUSCULAR | Status: AC
  Filled 2019-01-27: qty 5

## 2019-01-27 MED ORDER — HEMOSTATIC AGENTS (NO CHARGE) OPTIME
TOPICAL | Status: DC | PRN
  Administered 2019-01-27: 1 via TOPICAL

## 2019-01-27 MED ORDER — LACTATED RINGERS IV SOLN
INTRAVENOUS | Status: DC
  Administered 2019-01-27: 15:00:00 via INTRAVENOUS

## 2019-01-27 MED ORDER — 0.9 % SODIUM CHLORIDE (POUR BTL) OPTIME
TOPICAL | Status: DC | PRN
  Administered 2019-01-27: 1000 mL

## 2019-01-27 MED ORDER — SODIUM CHLORIDE 0.9 % IV SOLN
INTRAVENOUS | Status: AC
  Filled 2019-01-27: qty 500000

## 2019-01-27 MED ORDER — ONDANSETRON HCL 4 MG/2ML IJ SOLN
INTRAMUSCULAR | Status: DC | PRN
  Administered 2019-01-27: 4 mg via INTRAVENOUS

## 2019-01-27 MED ORDER — FENTANYL CITRATE (PF) 100 MCG/2ML IJ SOLN
INTRAMUSCULAR | Status: AC
  Administered 2019-01-27: 50 ug via INTRAVENOUS
  Filled 2019-01-27: qty 2

## 2019-01-27 MED ORDER — BACITRACIN ZINC 500 UNIT/GM EX OINT
TOPICAL_OINTMENT | CUTANEOUS | Status: DC | PRN
  Administered 2019-01-27: 1 via TOPICAL

## 2019-01-27 MED ORDER — EPHEDRINE 5 MG/ML INJ
INTRAVENOUS | Status: AC
  Filled 2019-01-27: qty 10

## 2019-01-27 MED ORDER — MIDAZOLAM HCL 2 MG/2ML IJ SOLN
INTRAMUSCULAR | Status: AC
  Filled 2019-01-27: qty 2

## 2019-01-27 MED ORDER — SODIUM CHLORIDE (PF) 0.9 % IJ SOLN
INTRAMUSCULAR | Status: AC
  Filled 2019-01-27: qty 10

## 2019-01-27 MED ORDER — SUCCINYLCHOLINE CHLORIDE 20 MG/ML IJ SOLN
INTRAMUSCULAR | Status: DC | PRN
  Administered 2019-01-27: 100 mg via INTRAVENOUS

## 2019-01-27 MED ORDER — PROPOFOL 10 MG/ML IV BOLUS
INTRAVENOUS | Status: AC
  Filled 2019-01-27: qty 40

## 2019-01-27 MED ORDER — PROMETHAZINE HCL 25 MG/ML IJ SOLN: 6.2500 mg | INTRAMUSCULAR | Status: DC | PRN

## 2019-01-27 MED ORDER — ARTIFICIAL TEARS OPHTHALMIC OINT
TOPICAL_OINTMENT | OPHTHALMIC | Status: AC
  Filled 2019-01-27: qty 3.5

## 2019-01-27 MED ORDER — PROPOFOL 10 MG/ML IV BOLUS
INTRAVENOUS | Status: DC | PRN
  Administered 2019-01-27: 80 mg via INTRAVENOUS
  Administered 2019-01-27: 40 mg via INTRAVENOUS

## 2019-01-27 MED ORDER — PHENYLEPHRINE HCL (PRESSORS) 10 MG/ML IV SOLN
INTRAVENOUS | Status: DC | PRN
  Administered 2019-01-27 (×4): 80 ug via INTRAVENOUS

## 2019-01-27 MED ORDER — LIDOCAINE 2% (20 MG/ML) 5 ML SYRINGE
INTRAMUSCULAR | Status: AC
  Filled 2019-01-27: qty 5

## 2019-01-27 MED ORDER — LACTATED RINGERS IV SOLN
INTRAVENOUS | Status: DC | PRN
  Administered 2019-01-27 (×2): via INTRAVENOUS

## 2019-01-27 MED ORDER — LIDOCAINE-EPINEPHRINE 1 %-1:100000 IJ SOLN
INTRAMUSCULAR | Status: AC
  Filled 2019-01-27: qty 1

## 2019-01-27 MED ORDER — FENTANYL CITRATE (PF) 250 MCG/5ML IJ SOLN
INTRAMUSCULAR | Status: DC | PRN
  Administered 2019-01-27 (×2): 50 ug via INTRAVENOUS
  Administered 2019-01-27: 100 ug via INTRAVENOUS
  Administered 2019-01-27 (×2): 50 ug via INTRAVENOUS

## 2019-01-27 MED ORDER — LIDOCAINE-EPINEPHRINE 1 %-1:100000 IJ SOLN
INTRAMUSCULAR | Status: DC | PRN
  Administered 2019-01-27: 20 mL

## 2019-01-27 MED ORDER — BUPIVACAINE-EPINEPHRINE (PF) 0.25% -1:200000 IJ SOLN
INTRAMUSCULAR | Status: AC
  Filled 2019-01-27: qty 30

## 2019-01-27 MED ORDER — FENTANYL CITRATE (PF) 100 MCG/2ML IJ SOLN
25.0000 ug | INTRAMUSCULAR | Status: DC | PRN
  Administered 2019-01-27 (×2): 50 ug via INTRAVENOUS

## 2019-01-27 MED ORDER — ONDANSETRON HCL 4 MG/2ML IJ SOLN
INTRAMUSCULAR | Status: AC
  Filled 2019-01-27: qty 2

## 2019-01-27 MED ORDER — SODIUM CHLORIDE 0.9 % IV SOLN
INTRAVENOUS | Status: DC | PRN
  Administered 2019-01-27: 500 mL

## 2019-01-27 SURGICAL SUPPLY — 64 items
APPLICATOR COTTON TIP 6 STRL (MISCELLANEOUS) IMPLANT
APPLICATOR COTTON TIP 6IN STRL (MISCELLANEOUS) IMPLANT
BAG DECANTER FOR FLEXI CONT (MISCELLANEOUS) IMPLANT
BENZOIN TINCTURE PRP APPL 2/3 (GAUZE/BANDAGES/DRESSINGS) ×4 IMPLANT
CANISTER SUCT 3000ML PPV (MISCELLANEOUS) ×4 IMPLANT
CONT SPEC 4OZ CLIKSEAL STRL BL (MISCELLANEOUS) IMPLANT
COVER SURGICAL LIGHT HANDLE (MISCELLANEOUS) ×4 IMPLANT
COVER WAND RF STERILE (DRAPES) ×4 IMPLANT
DRAPE HALF SHEET 40X57 (DRAPES) IMPLANT
DRAPE IMP U-DRAPE 54X76 (DRAPES) ×4 IMPLANT
DRAPE INCISE IOBAN 66X45 STRL (DRAPES) IMPLANT
DRAPE LAPAROSCOPIC ABDOMINAL (DRAPES) IMPLANT
DRAPE LAPAROTOMY 100X72 PEDS (DRAPES) ×4 IMPLANT
DRESSING HYDROCOLLOID 4X4 (GAUZE/BANDAGES/DRESSINGS) ×6 IMPLANT
DRSG ADAPTIC 3X8 NADH LF (GAUZE/BANDAGES/DRESSINGS) IMPLANT
DRSG CUTIMED SORBACT 7X9 (GAUZE/BANDAGES/DRESSINGS) ×2 IMPLANT
DRSG EMULSION OIL 3X3 NADH (GAUZE/BANDAGES/DRESSINGS) IMPLANT
DRSG PAD ABDOMINAL 8X10 ST (GAUZE/BANDAGES/DRESSINGS) IMPLANT
DRSG TELFA 3X8 NADH (GAUZE/BANDAGES/DRESSINGS) ×4 IMPLANT
DRSG VAC ATS LRG SENSATRAC (GAUZE/BANDAGES/DRESSINGS) IMPLANT
DRSG VAC ATS MED SENSATRAC (GAUZE/BANDAGES/DRESSINGS) ×2 IMPLANT
DRSG VAC ATS SM SENSATRAC (GAUZE/BANDAGES/DRESSINGS) IMPLANT
ELECT CAUTERY BLADE 6.4 (BLADE) ×4 IMPLANT
ELECT REM PT RETURN 9FT ADLT (ELECTROSURGICAL) ×4
ELECTRODE REM PT RTRN 9FT ADLT (ELECTROSURGICAL) ×2 IMPLANT
GAUZE 4X4 16PLY RFD (DISPOSABLE) ×4 IMPLANT
GAUZE SPONGE 4X4 12PLY STRL (GAUZE/BANDAGES/DRESSINGS) IMPLANT
GLOVE BIO SURGEON STRL SZ 6.5 (GLOVE) ×3 IMPLANT
GLOVE BIO SURGEONS STRL SZ 6.5 (GLOVE) ×1
GOWN STRL REUS W/ TWL LRG LVL3 (GOWN DISPOSABLE) ×6 IMPLANT
GOWN STRL REUS W/TWL LRG LVL3 (GOWN DISPOSABLE) ×6
HEMOSTAT SURGICEL 2X4 FIBR (HEMOSTASIS) ×2 IMPLANT
KIT BASIN OR (CUSTOM PROCEDURE TRAY) ×4 IMPLANT
KIT TURNOVER KIT B (KITS) ×4 IMPLANT
MATRIX WOUND 3-LAYER 10X15 (Tissue) ×1 IMPLANT
MICROMATRIX 1000MG (Tissue) ×8 IMPLANT
NDL HYPO 25GX1X1/2 BEV (NEEDLE) ×2 IMPLANT
NEEDLE HYPO 25GX1X1/2 BEV (NEEDLE) ×4 IMPLANT
NS IRRIG 1000ML POUR BTL (IV SOLUTION) ×4 IMPLANT
PACK GENERAL/GYN (CUSTOM PROCEDURE TRAY) ×4 IMPLANT
PACK SURGICAL SETUP 50X90 (CUSTOM PROCEDURE TRAY) ×4 IMPLANT
PACK UNIVERSAL I (CUSTOM PROCEDURE TRAY) ×4 IMPLANT
PAD ARMBOARD 7.5X6 YLW CONV (MISCELLANEOUS) ×8 IMPLANT
PAD DRESSING TELFA 3X8 NADH (GAUZE/BANDAGES/DRESSINGS) IMPLANT
PENCIL BUTTON HOLSTER BLD 10FT (ELECTRODE) ×4 IMPLANT
SOLUTION PARTIC MCRMTRX 1000MG (Tissue) IMPLANT
SPONGE LAP 18X18 RF (DISPOSABLE) ×2 IMPLANT
STAPLER VISISTAT 35W (STAPLE) ×4 IMPLANT
SURGILUBE 2OZ TUBE FLIPTOP (MISCELLANEOUS) IMPLANT
SUT CHROMIC 4 0 P 3 18 (SUTURE) IMPLANT
SUT ETHILON 4 0 PS 2 18 (SUTURE) IMPLANT
SUT ETHILON 5 0 P 3 18 (SUTURE)
SUT MNCRL AB 4-0 PS2 18 (SUTURE) IMPLANT
SUT NYLON ETHILON 5-0 P-3 1X18 (SUTURE) IMPLANT
SUT SILK 0 SH 30 (SUTURE) ×2 IMPLANT
SUT VIC AB 5-0 PS2 18 (SUTURE) ×6 IMPLANT
SWAB COLLECTION DEVICE MRSA (MISCELLANEOUS) ×2 IMPLANT
SWAB CULTURE ESWAB REG 1ML (MISCELLANEOUS) ×2 IMPLANT
SYR BULB 3OZ (MISCELLANEOUS) IMPLANT
SYR CONTROL 10ML LL (SYRINGE) ×4 IMPLANT
TOWEL GREEN STERILE (TOWEL DISPOSABLE) ×4 IMPLANT
TOWEL GREEN STERILE FF (TOWEL DISPOSABLE) ×4 IMPLANT
UNDERPAD 30X30 (UNDERPADS AND DIAPERS) ×4 IMPLANT
WOUND MATRIX 3-LAYER 10X15 (Tissue) ×1 IMPLANT

## 2019-01-27 NOTE — Progress Notes (Addendum)
Patient's INR is 3.4.  Message left with Dr. Eusebio Friendly office at 11:40.  Did not receive call back.  Dr. Earnest Conroy aware of INR and also called to leave message to advise Dr. Marijo File in OR advised of patient's INR level.  Per Dr. Earnest Conroy, Dr. Marla Roe aware of INR level and okay to proceed with surgery.

## 2019-01-27 NOTE — Progress Notes (Signed)
      INFECTIOUS DISEASE ATTENDING ADDENDUM:   Date: 01/27/2019  Patient name: Kim Anthony  Medical record number: [id number]  Date of birth: 1950/10/27   I am DC antibiotics to increase yield on intraoperative cultures if pt goes to surgery  SEE CT report   Alcide Evener 01/27/2019, 7:52 AM

## 2019-01-27 NOTE — Progress Notes (Signed)
Dr. Marla Roe made aware of patient's PTINR, per MD okay to proceed with surgery, OR desk aware.  -

## 2019-01-27 NOTE — Op Note (Addendum)
DATE OF OPERATION: 01/27/2019  LOCATION: [facility] In[facility]  PREOPERATIVE DIAGNOSIS: Left breast wound  POSTOPERATIVE DIAGNOSIS: Same  PROCEDURE: Excision of left breast wound 7 x 12 cm skin, fat and 2 mg muscle, placement of Acell sheet 10 x 15 and 2 gm powder, VAC placement  SURGEON: Aleksi Brummet Sanger Cal Gindlesperger, DO  ASSISTANT: Roetta Sessions, PA  EBL: 5 cc  CONDITION: Stable  COMPLICATIONS: None  INDICATION: The patient, Kim Anthony, is a 68 y.o. female born on 12-31-1950, is here for treatment of breakdown of the left chest after a flap.  This is not unexpected due to the radiation damage.   PROCEDURE DETAILS:  The patient was seen prior to surgery and marked.  The IV antibiotics were not given so cultures could be obtained. The patient was taken to the operating room and given a general anesthetic. A standard time out was performed and all information was confirmed by those in the room. SCDs were placed.   The chest and left breast was prepped and draped.   The #10 blade and tissue scissors were used to excise the skin paddle.  The flap did not bleed well at the superficial layers.  As I continued the excision deeper there was healthy looking tissue at the fat layer (skin and fat 7 x 10 cm) directly above the muscle layers.  The medial 2 cm of the muscle was trimmed to better bleeding tissue.  Due to the elevated INR I was very careful with the excision.  A telfa soaked with local with epi was placed on the wound. Fibrilar was then placed.  Cultures were obtain from the lateral deep aspect of the wound including AFB.  I did not see any purulence.  There was a 2 cm blood clot on the medical aspect of the wound.  All of the Acell powder and sheet were applied and secured with the 5-0 Vicryl. The sorbact was applied and secured.  The VAC was then applied and had a good seal.  The anterior drain was secured with the 4-0 Silk.  The posterior superior drain fell out at the start of  the case.  Vaseline was applied to the site. The patient was allowed to wake up and taken to recovery room in stable condition at the end of the case. The family was notified at the end of the case.   The advanced practice practitioner (APP) assisted throughout the case.  The APP was essential in retraction and counter traction when needed to make the case progress smoothly.  This retraction and assistance made it possible to see the tissue plans for the procedure.  The assistance was needed for blood control, tissue re-approximation and assisted with closure of the incision site.

## 2019-01-27 NOTE — Anesthesia Preprocedure Evaluation (Addendum)
Anesthesia Evaluation  Patient identified by [name], date of birth, ID band Patient awake    Reviewed: Allergy & Precautions, NPO status , Patient's Chart, lab work & pertinent test results  Airway Mallampati: II  TM Distance: >3 FB     Dental  (+) Edentulous Upper, Edentulous Lower   Pulmonary COPD, Current Smoker and Patient abstained from smoking.,    breath sounds clear to auscultation       Cardiovascular hypertension, + CAD and +CHF   Rhythm:Regular Rate:Normal     Neuro/Psych    GI/Hepatic negative GI ROS,   Endo/Other  negative endocrine ROS  Renal/GU negative Renal ROS     Musculoskeletal   Abdominal   Peds  Hematology   Anesthesia Other Findings   Reproductive/Obstetrics                           Anesthesia Physical Anesthesia Plan  ASA: III  Anesthesia Plan: General   Post-op Pain Management:    Induction: Intravenous  PONV Risk Score and Plan: 2 and Ondansetron and Midazolam  Airway Management Planned: Oral ETT  Additional Equipment:   Intra-op Plan:   Post-operative Plan: Extubation in OR  Informed Consent: I have reviewed the patients History and Physical, chart, labs and discussed the procedure including the risks, benefits and alternatives for the proposed anesthesia with the patient or authorized representative who has indicated his/her understanding and acceptance.     Dental advisory given  Plan Discussed with: CRNA, Anesthesiologist and Surgeon  Anesthesia Plan Comments:        Anesthesia Quick Evaluation

## 2019-01-27 NOTE — Progress Notes (Signed)
PT Cancellation Note  Patient Details Name: Kim Anthony [name]: [id number] DOB: [birth year]   Cancelled Treatment:    Reason Eval/Treat Not Completed: Patient at procedure or test/unavailable (surgery).  Ellamae Sia, PT, DPT Acute Rehabilitation Services Pager [phone] Office [phone]    Willy Eddy 01/27/2019, 3:11 PM

## 2019-01-27 NOTE — Progress Notes (Signed)
PROGRESS NOTE    Kim Anthony  BWL:[id number]  DOB: July 10, 1950  DOA: 01/18/2019 PCP: Marianna Payment, MD  Brief Narrative:   68 year old female with history of hypertension, COPD, mechanical heart valve, pulmonary embolism on Coumadin, CHF (EF 68 to 55%), COPD, and left breast cancer, who had undergone a left  mastectomy and complete axillary dissection in [place] approximately 18 years ago followed by radiation therapy and anti-hormonal therapy was doing well from a breast standpoint (no longer sees oncologist) until 6 months ago, she developed an abscess and draining wound in her left axilla at the very lateral edge of her mastectomy incision. She was on multiple antibiotics without improvement. She also has left shoulder pain. She has chronic pain with limited mobility of the shoulder. She was seen by general surgery Dr. Ninfa Linden on July 7 and there was concern for deep infection (CT scan 12/19 was unremarkable regarding the chest wall and axilla but an ultrasound January 25 digit showed  ill-defined 3.8 x 3.8 cm area likely representing fluid collections) and patient was planned for OR intervention after discussing with plastic surgery regarding a possible muscle flap.  Post excision, patient presented to the ED on July 19 due to opening of the wound and was seen by plastic surgery on 7/20 for follow-up at which time latissimus muscle flap was recommended.  She underwent this procedure (latissimus flap to the left breast) on July 29 subsequent to which she developed fever 1026F with tachycardia and some hypotension with SBP dipping to 90s.  Patient was admitted to [facility] under hospitalist service and was started on vancomycin/cefepime for sepsis syndrome.  CTA of the chest did not show any PE but reported extensive subcutaneous edema and inflammatory change involving the left chest wall anteriorly and laterally, extending to the presternal soft tissues, consistent with soft tissue infection as well  as numerous gas and fluid collections within the left anterior chest wall soft tissues, concerning for soft tissue abscess or necrosis.pharmacy has been managing Coumadin and Plastic surgery has been following along for local wound care/dressing changes and drain management. Patient has had leukocytosis as well as intermittent temp spikes during the hospital course (most significantly 1026F on 7/31, 102.26F on 8/3, 100.7 on 8/4 and 100.9 on 8/5).  Azithromycin was added to vancomycin/cefepime yesterday (8/5).  Her last COVID testing was on 7/27 which was negative.  Subjective:  Afebrile in last 24 hours.  Seen by infectious diseases and plastic surgery planning on obtaining deep cultures.  INR therapeutic today.   Objective: Vitals:   01/26/19 2251 01/27/19 0743 01/27/19 1122 01/27/19 1431  BP: (!) 145/75 137/89 135/77   Pulse: 75 82 77   Resp:  16 18   Temp:  97.6 F (36.4 C) 97.7 F (36.5 C)   TempSrc:  Oral Oral   SpO2:  99% 93%   Weight:    77.6 kg  Height:    5\' 5"  (1.651 m)    Intake/Output Summary (Last 24 hours) at 01/27/2019 1735 Last data filed at 01/27/2019 1710 Gross per 24 hour  Intake 880 ml  Output 105 ml  Net 775 ml   Filed Weights   01/18/19 1900 01/27/19 1431  Weight: 77.6 kg 77.6 kg    Physical Examination:  General exam: Appears calm and comfortable  Respiratory system: Clear to auscultation. Respiratory effort normal. Cardiovascular system: S1 & S2 heard, RRR. No JVD, murmurs, rubs, gallops or clicks. No pedal edema. Gastrointestinal system: Abdomen is nondistended, soft and  nontender. No organomegaly or masses felt. Normal bowel sounds heard. Central nervous system: Alert and oriented. No focal neurological deficits. Extremities: Symmetric 5 x 5 power. Skin: Left back incision dressing/anterior chest wall dressing/breast binder with drains in place. Psychiatry: Judgement and insight appear normal. Mood & affect appropriate.     Data Reviewed: I have  personally reviewed following labs and imaging studies  CBC: Recent Labs  Lab 01/22/19 0745 01/23/19 0402 01/24/19 6568 01/25/19 0715 01/26/19 0207 01/27/19 0653  WBC 15.5* 12.2* 12.9* 15.8* 15.7* 13.6*  NEUTROABS 11.6* 8.3* 9.7* 11.1*  --   --   HGB 8.6* 9.3* 8.3* 8.5* 8.3* 8.1*  HCT 27.6* 30.0* 26.0* 27.3* 27.1* 26.6*  MCV 97.9 97.4 95.6 97.2 99.3 98.2  PLT 412* 369 480* 614* 590* 127*   Basic Metabolic Panel: Recent Labs  Lab 01/23/19 0402 01/24/19 0842 01/25/19 0715 01/26/19 0207 01/27/19 0653  NA 132* 134* 136 135 136  K 3.6 2.9* 3.6 3.2* 3.6  CL 102 102 104 104 107  CO2 19* 20* 22 19* 19*  GLUCOSE 117* 154* 129* 98 130*  BUN 11 9 8  7* 7*  CREATININE 0.73 0.76 0.64 0.65 0.80  CALCIUM 8.3* 8.5* 8.7* 8.3* 8.5*  MG  --  1.7  --   --   --   PHOS  --  2.4*  --   --   --    GFR: Estimated Creatinine Clearance: 68.3 mL/min (by C-G formula based on SCr of 0.8 mg/dL). Liver Function Tests: No results for input(s): AST, ALT, ALKPHOS, BILITOT, PROT, ALBUMIN in the last 168 hours. No results for input(s): LIPASE, AMYLASE in the last 168 hours. No results for input(s): AMMONIA in the last 168 hours. Coagulation Profile: Recent Labs  Lab 01/23/19 0402 01/24/19 0842 01/25/19 0715 01/26/19 0207 01/27/19 0653  INR 1.5* 1.8* 2.3* 2.9* 3.4*   Cardiac Enzymes: No results for input(s): CKTOTAL, CKMB, CKMBINDEX, TROPONINI in the last 168 hours. BNP (last 3 results) No results for input(s): PROBNP in the last 8760 hours. HbA1C: No results for input(s): HGBA1C in the last 72 hours. CBG: No results for input(s): GLUCAP in the last 168 hours. Lipid Profile: No results for input(s): CHOL, HDL, LDLCALC, TRIG, CHOLHDL, LDLDIRECT in the last 72 hours. Thyroid Function Tests: No results for input(s): TSH, T4TOTAL, FREET4, T3FREE, THYROIDAB in the last 72 hours. Anemia Panel: No results for input(s): VITAMINB12, FOLATE, FERRITIN, TIBC, IRON, RETICCTPCT in the last 72 hours.  Sepsis Labs: No results for input(s): PROCALCITON, LATICACIDVEN in the last 168 hours.  Recent Results (from the past 240 hour(s))  Culture, blood (routine x 2)     Status: None   Collection Time: 01/19/19  9:41 PM   Specimen: BLOOD  Result Value Ref Range Status   Specimen Description BLOOD RIGHT WRIST  Final   Special Requests   Final    BOTTLES DRAWN AEROBIC ONLY Blood Culture adequate volume   Culture   Final    NO GROWTH 5 DAYS Performed at [facility], [address] [place], [place] [address]    Report Status 01/24/2019 FINAL  Final  Culture, blood (routine x 2)     Status: None   Collection Time: 01/19/19  9:41 PM   Specimen: BLOOD  Result Value Ref Range Status   Specimen Description BLOOD RIGHT HAND  Final   Special Requests   Final    BOTTLES DRAWN AEROBIC ONLY Blood Culture adequate volume   Culture   Final  NO GROWTH 5 DAYS Performed at [facility], [place] [address], [place], [place] [address]    Report Status 01/24/2019 FINAL  Final  Culture, blood (routine x 2)     Status: None (Preliminary result)   Collection Time: 01/23/19 12:31 PM   Specimen: BLOOD  Result Value Ref Range Status   Specimen Description BLOOD BLOOD LEFT HAND  Final   Special Requests AEROBIC BOTTLE ONLY Blood Culture adequate volume  Final   Culture   Final    NO GROWTH 4 DAYS Performed at [facility], [place] [address], [place], [place] [address]    Report Status PENDING  Incomplete  Culture, blood (routine x 2)     Status: None (Preliminary result)   Collection Time: 01/23/19 12:31 PM   Specimen: BLOOD  Result Value Ref Range Status   Specimen Description BLOOD BLOOD LEFT HAND  Final   Special Requests AEROBIC BOTTLE ONLY Blood Culture adequate volume  Final   Culture   Final    NO GROWTH 4 DAYS Performed at [facility], [place] [address], [place], [place] [address]    Report Status PENDING  Incomplete  Expectorated sputum assessment w rflx to resp cult      Status: None   Collection Time: 01/24/19  5:51 AM   Specimen: Sputum  Result Value Ref Range Status   Specimen Description SPUTUM  Final   Special Requests NONE  Final   Sputum evaluation   Final    THIS SPECIMEN IS ACCEPTABLE FOR SPUTUM CULTURE Performed at [facility], [place] [address], [place], [place] [address]    Report Status 01/24/2019 FINAL  Final  Culture, respiratory     Status: None   Collection Time: 01/24/19  5:51 AM   Specimen: SPU  Result Value Ref Range Status   Specimen Description SPUTUM  Final   Special Requests NONE Reflexed from [id number]  Final   Gram Stain   Final    FEW WBC PRESENT, PREDOMINANTLY PMN ABUNDANT GRAM NEGATIVE RODS ABUNDANT GRAM POSITIVE COCCI IN PAIRS IN CLUSTERS FEW GRAM POSITIVE RODS    Culture   Final    MODERATE Consistent with normal respiratory flora. Performed at [facility], [place] [address], [place], [place] [address]    Report Status 01/26/2019 FINAL  Final  Surgical pcr screen     Status: None   Collection Time: 01/27/19 11:49 AM   Specimen: Nasal Mucosa; Nasal Swab  Result Value Ref Range Status   MRSA, PCR NEGATIVE NEGATIVE Final   Staphylococcus aureus NEGATIVE NEGATIVE Final    Comment: (NOTE) The Xpert SA Assay (FDA approved for NASAL specimens in patients 16 years of age and older), is one component of a comprehensive surveillance program. It is not intended to diagnose infection nor to guide or monitor treatment. Performed at [facility], [place] [address], [place], [place] [address]       Radiology Studies: Ct Chest W Contrast  Result Date: 01/26/2019 CLINICAL DATA:  Fever, recent wound infection following left mastectomy and chest wall revision EXAM: CT CHEST WITH CONTRAST TECHNIQUE: Multidetector CT imaging of the chest was performed during intravenous contrast administration. CONTRAST:  75 mL OMNIPAQUE IOHEXOL 300 MG/ML  SOLN COMPARISON:  Numerous priors, most recent CT January 20, 2019 FINDINGS:  Cardiovascular: The ascending aorta is ectatic and calcified. No frank thoracic aortic aneurysm. Central pulmonary arterial enlargement likely reflecting pulmonary artery hypertension. There is stable cardiomegaly with biatrial  enlargement. A mitral valve prosthesis noted. Coronary arteries are heavily calcified with additional surgical clips within the mediastinum possibly related to prior CABG. There is thickening enhancement of the pericardium at the cardiac apex similar to prior studies and likely reflecting radiation related change. Mediastinum/Nodes: Trachea is unremarkable. No concerning thyroid abnormality. No esophageal abnormality. Redemonstration of mediastinal, hilar and axillary adenopathy. Many of these nodes are low-attenuation are likely reactive in nature. Reference nodes include a 15 mm right axillary lymph node (3/78), stable. A 2 cm precarinal lymph node is similar to prior (3/53). Lungs/Pleura: Multiple areas of radiation related change including fibrosis and pleural thickening in the anterior portion of the left lung as well as more masslike fibrosis in the left lung apex. These areas unchanged on multiple serial comparison images. There is a background of moderate to severe centrilobular emphysema. There is been interval increase in the size a left pleural effusion. Increasing ground-glass opacity in the posterior lung bases likely reflecting atelectasis. No new consolidative opacity. Upper Abdomen: No acute abnormalities present in the visualized portions of the upper abdomen. Musculoskeletal: There are extensive post mastectomy changes on the left with interval removal of the anterior drainage catheter. A posterior chest wall drain remains in place. There has been interval enlargement of the rim enhancing subcutaneous air and fluid collection within the soft tissues of the anterior left shoulder girdle. A surgical drain is positioned inferiorly to these complex collections. A separate drain  along the posterior chest wall remains in position with minimal phlegmonous change along the posterior soft tissues. There is overlying skin thickening concerning for abscess formation with cellulitis and while hypoattenuation of the adjacent musculature suspicious for reactive myositis. Sclerosis and lucency of the subjacent ribs is grossly similar to the prior study. IMPRESSION: 1. Interval enlargement of the rim enhancing subcutaneous air and fluid collection within the soft tissues of the anterior left shoulder girdle, concerning for worsening necrosis and abscess formation. There is overlying skin thickening which may reflect developing cellulitis with areas of cutaneous defect. Hypoattenuation of the adjacent musculature is suspicious for reactive myositis. The anterior surgical drain is positioned inferiorly to these complex collections. 2. Diminishing phlegmonous change seen in the posterior soft tissues. 3. Slight interval increase in the size of a left pleural effusion. 4. Extensive areas of scarring and fibrosis throughout the left lung and along the pleura as well as at the cardiac apex likely reflecting radiation related changes seen on multiple prior exams. 5. Mitral valve replacement 6. Cardiomegaly with biatrial enlargement and coronary atherosclerosis. 7. Aortic Atherosclerosis (ICD10-I70.0). 8.  Emphysema (ICD10-J43.9). These results were called by telephone at the time of interpretation on 01/26/2019 at 10:03 pm to Dr. Posey Pronto, who verbally acknowledged these results. Electronically Signed   By: Lovena Le M.D.   On: 01/26/2019 22:04        Scheduled Meds: . [MAR Hold] dextromethorphan-guaiFENesin  1 tablet Oral BID  . [MAR Hold] furosemide  40 mg Oral Daily  . [MAR Hold] gabapentin  600 mg Oral BID  . [MAR Hold] metoprolol succinate  12.5 mg Oral QHS  . [MAR Hold] potassium chloride  20 mEq Oral Daily  . [MAR Hold] senna  1 tablet Oral BID  . [MAR Hold] simvastatin  20 mg Oral Daily   . [MAR Hold] umeclidinium-vilanterol  1 puff Inhalation Daily   Continuous Infusions: . lactated ringers 10 mL/hr at 01/27/19 1444    Assessment & Plan:    1.  Sepsis secondary to postoperative wound  infection: Present on admission.leukocytosis downtrending.  (peaked to 21 K during the hospital course, today at 39 K) and afebrile now.  Appreciate infectious diseases input.  Repeat CT scan chest concerning for worsening necrosis and increasing size of fluid pockets. Dr. Tommy Medal evaluated patient, held antibiotics for now and discussed with plastic surgery regarding deep cultures.  Appreciate ID input.  Plastic surgery has been following along  and they have been performing local wound care with [employer]/[place] jelly (please refer to their note for details).  Per their note, all 3 drains were to remain in place and are draining serosanguineous fluid from left chest wall/left latissimus back incision.  Patient was on IV vancomycin/cefepime since admission-8/6 but held now per ID.  Discontinued azithromycin.  Blood cultures from 7/30 and 8/3 negative so far.  2.  History of PE/mechanical heart valve: Patient been on heparin/Coumadin  with INR goal 2.5-3.5.  Discussed with pharmacy regarding holding Coumadin in view of anticipated procedures as suggested by ID.  Heparin drip can be resumed once INR less than 2.5  3.  COPD/emphysema: Stable currently without any wheezing.  Saturating well on room air.  CT chest did show emphysematous changes and fibrotic changes along left upper lobe anterior and apical fibrosis, No change in 6 mm right apical lung nodule and right axillary/mediastinal reactive lymphadenopathy.  MDI/nebs as needed  4.  Breast cancer: Status post mastectomy/radiation/hormone treatment many years back.  No longer sees an oncologist.  Most recent CT scans suggest reactive lymphadenopathy and she does have significant wound infection.  5.  Chronic left shoulder pain: Could be related to wound  infection.  Pain meds as needed.  X-ray of the left shoulder was negative for any acute issues in January.  6.  Hypokalemia: Replace as needed.  Now on 20 mEq daily.    DVT prophylaxis: On anticoagulation, INR greater than 2 Code Status: Full code Family / Patient Communication: Discussed with patient in detail.  Discussed with plastic surgery Dr. Audelia Hives Disposition Plan: TBD     LOS: 9 days    Time spent: 41 minutes    Guilford Shi, MD [facility] Pager [phone]  If 7PM-7AM, please contact night-coverage [email or web address] Password [email or web address] 01/27/2019, 5:35 PM

## 2019-01-27 NOTE — Interval H&P Note (Signed)
History and Physical Interval Note:  01/27/2019 3:40 PM  Kim Anthony  has presented today for surgery, with the diagnosis of Open wound of chest wall.  The various methods of treatment have been discussed with the patient and family. After consideration of risks, benefits and other options for treatment, the patient has consented to  Procedure(s): APPLICATION OF A-CELL OF CHEST (Left) APPLICATION OF WOUND VAC (N/A) IRRIGATION AND DEBRIDEMENT OF CHEST WALL (Left) as a surgical intervention.  The patient's history has been reviewed, patient examined, no change in status, stable for surgery.  I have reviewed the patient's chart and labs.  Questions were answered to the patient's satisfaction.     Loel Lofty Fotios Amos

## 2019-01-27 NOTE — Transfer of Care (Signed)
Immediate Anesthesia Transfer of Care Note  Patient: Kim Anthony  Procedure(s) Performed: APPLICATION OF A-CELL OF CHEST (Left Chest) APPLICATION OF WOUND VAC (N/A Chest) IRRIGATION AND DEBRIDEMENT OF CHEST WALL (Left Chest)  Patient Location: PACU  Anesthesia Type:General  Level of Consciousness: drowsy  Airway & Oxygen Therapy: Patient Spontanous Breathing and Patient connected to face mask oxygen  Post-op Assessment: Report given to RN and Post -op Vital signs reviewed and stable  Post vital signs: Reviewed and stable  Last Vitals:  Vitals Value Taken Time  BP 136/75 01/27/19 1807  Temp    Pulse 83 01/27/19 1810  Resp 27 01/27/19 1810  SpO2 96 % 01/27/19 1810  Vitals shown include unvalidated device data.  Last Pain:  Vitals:   01/27/19 1431  TempSrc:   PainSc: 8       Patients Stated Pain Goal: 2 (77/11/65 7903)  Complications: No apparent anesthesia complications

## 2019-01-27 NOTE — Progress Notes (Signed)
Subjective: No new complaints   Antibiotics:  Anti-infectives (From admission, onward)   Start     Dose/Rate Route Frequency Ordered Stop   01/25/19 1500  azithromycin (ZITHROMAX) 500 mg in sodium chloride 0.9 % 250 mL IVPB  Status:  Discontinued     500 mg 250 mL/hr over 60 Minutes Intravenous Every 24 hours 01/25/19 1414 01/26/19 1502   01/23/19 1800  vancomycin (VANCOCIN) IVPB 1000 mg/200 mL premix  Status:  Discontinued     1,000 mg 200 mL/hr over 60 Minutes Intravenous Every 12 hours 01/23/19 0541 01/27/19 0752   01/19/19 2200  ceFEPIme (MAXIPIME) 2 g in sodium chloride 0.9 % 100 mL IVPB  Status:  Discontinued     2 g 200 mL/hr over 30 Minutes Intravenous Every 8 hours 01/19/19 2120 01/27/19 0752   01/19/19 2200  vancomycin (VANCOCIN) 1,500 mg in sodium chloride 0.9 % 500 mL IVPB  Status:  Discontinued     1,500 mg 250 mL/hr over 120 Minutes Intravenous Every 24 hours 01/19/19 2123 01/23/19 0541   01/19/19 1900  sulfamethoxazole-trimethoprim (BACTRIM) 291.04 mg in dextrose 5 % 250 mL IVPB  Status:  Discontinued     15 mg/kg/day  77.6 kg 268.2 mL/hr over 60 Minutes Intravenous Every 6 hours 01/19/19 1824 01/19/19 2054   01/18/19 2200  ceFAZolin (ANCEF) IVPB 2g/100 mL premix  Status:  Discontinued     2 g 200 mL/hr over 30 Minutes Intravenous Every 8 hours 01/18/19 1804 01/19/19 2042   01/18/19 1155  ceFAZolin (ANCEF) 2-4 GM/100ML-% IVPB    Note to Pharmacy: Merryl Hacker   : cabinet override      01/18/19 1155 01/18/19 2359      Medications: Scheduled Meds: . dextromethorphan-guaiFENesin  1 tablet Oral BID  . furosemide  40 mg Oral Daily  . gabapentin  600 mg Oral BID  . metoprolol succinate  12.5 mg Oral QHS  . potassium chloride  20 mEq Oral Daily  . senna  1 tablet Oral BID  . simvastatin  20 mg Oral Daily  . umeclidinium-vilanterol  1 puff Inhalation Daily  . Warfarin - Pharmacist Dosing Inpatient   Does not apply q1800   Continuous Infusions: PRN  Meds:.acetaminophen, albuterol, bisacodyl, diazepam, diphenhydrAMINE **OR** diphenhydrAMINE, fluticasone, HYDROcodone-acetaminophen, HYDROmorphone (DILAUDID) injection, morphine injection, ondansetron **OR** ondansetron (ZOFRAN) IV, oxyCODONE-acetaminophen, polyethylene glycol, sodium chloride flush    Objective: Weight change:   Intake/Output Summary (Last 24 hours) at 01/27/2019 1057 Last data filed at 01/27/2019 0930 Gross per 24 hour  Intake 880 ml  Output 55 ml  Net 825 ml   Blood pressure 137/89, pulse 82, temperature 97.6 F (36.4 C), temperature source Oral, resp. rate 16, height 5\' 5"  (1.651 m), weight 77.6 kg, SpO2 99 %. Temp:  [97.5 F (36.4 C)-97.7 F (36.5 C)] 97.6 F (36.4 C) (08/07 0743) Pulse Rate:  [75-109] 82 (08/07 0743) Resp:  [16-19] 16 (08/07 0743) BP: (127-153)/(75-98) 137/89 (08/07 0743) SpO2:  [91 %-99 %] 99 % (08/07 0743)  Physical Exam: General: Alert and awake, oriented x3, not in any acute distress. HEENT: anicteric sclera, EOMI CVS regular rate, normal  Chest: , no wheezing, no respiratory distress Abdomen: soft non-distended,  Extremities: no edema or deformity noted bilaterally Skin: I did not take down the dressing today she does have limited range of motion in her left shoulder and some induration and tenderness in her axillary area, drains in place Neuro: nonfocal  CBC:    BMET  Recent Labs    01/26/19 0207 01/27/19 0653  NA 135 136  K 3.2* 3.6  CL 104 107  CO2 19* 19*  GLUCOSE 98 130*  BUN 7* 7*  CREATININE 0.65 0.80  CALCIUM 8.3* 8.5*     Liver Panel  No results for input(s): PROT, ALBUMIN, AST, ALT, ALKPHOS, BILITOT, BILIDIR, IBILI in the last 72 hours.     Sedimentation Rate No results for input(s): ESRSEDRATE in the last 72 hours. C-Reactive Protein No results for input(s): CRP in the last 72 hours.  Micro Results: Recent Results (from the past 720 hour(s))  Culture, blood (routine x 2)     Status: None    Collection Time: 01/07/19 12:26 PM   Specimen: BLOOD RIGHT HAND  Result Value Ref Range Status   Specimen Description BLOOD RIGHT HAND  Final   Special Requests   Final    AEROBIC BOTTLE ONLY Blood Culture results may not be optimal due to an inadequate volume of blood received in culture bottles   Culture   Final    NO GROWTH 5 DAYS Performed at [facility], [place] [address], [place], [place] [address]    Report Status 01/12/2019 FINAL  Final  Culture, blood (routine x 2)     Status: None   Collection Time: 01/07/19 12:39 PM   Specimen: BLOOD RIGHT WRIST  Result Value Ref Range Status   Specimen Description BLOOD RIGHT WRIST  Final   Special Requests   Final    BOTTLES DRAWN AEROBIC AND ANAEROBIC Blood Culture adequate volume   Culture   Final    NO GROWTH 5 DAYS Performed at [facility], [place] [address], [place], [place] [address]    Report Status 01/12/2019 FINAL  Final  SARS Coronavirus 2 (Performed in [facility] lab)     Status: None   Collection Time: 01/16/19 10:12 AM   Specimen: Nasal Swab  Result Value Ref Range Status   SARS Coronavirus 2 NEGATIVE NEGATIVE Final    Comment: (NOTE) SARS-CoV-2 target nucleic acids are NOT DETECTED. The SARS-CoV-2 RNA is generally detectable in upper and lower respiratory specimens during the acute phase of infection. Negative results do not preclude SARS-CoV-2 infection, do not rule out co-infections with other pathogens, and should not be used as the sole basis for treatment or other patient management decisions. Negative results must be combined with clinical observations, patient history, and epidemiological information. The expected result is Negative. Fact Sheet for Patients: SugarRoll.be Fact Sheet for Healthcare Providers: https://www.woods-mathews.com/ This test is not yet approved or cleared by the [place] FDA and  has been authorized for detection and/or  diagnosis of SARS-CoV-2 by FDA under an Emergency Use Authorization (EUA). This EUA will remain  in effect (meaning this test can be used) for the duration of the COVID-19 declaration under Section 56 4(b)(1) of the Act, 21 U.S.C. section 360bbb-3(b)(1), unless the authorization is terminated or revoked sooner. Performed at [facility], [place] [address], [place], [place] [address]   Culture, blood (routine x 2)     Status: None   Collection Time: 01/19/19  9:41 PM   Specimen: BLOOD  Result Value Ref Range Status   Specimen Description BLOOD RIGHT WRIST  Final   Special Requests   Final    BOTTLES DRAWN AEROBIC ONLY Blood Culture adequate volume   Culture   Final    NO GROWTH 5 DAYS Performed at [facility], [address], [place], [place] [address]    Report Status 01/24/2019 FINAL  Final  Culture, blood (routine x 2)     Status: None   Collection Time: 01/19/19  9:41 PM   Specimen: BLOOD  Result Value Ref Range Status   Specimen Description BLOOD RIGHT HAND  Final   Special Requests   Final    BOTTLES DRAWN AEROBIC ONLY Blood Culture adequate volume   Culture   Final    NO GROWTH 5 DAYS Performed at [facility], [place] [address] [place], [place] [address]    Report Status 01/24/2019 FINAL  Final  Culture, blood (routine x 2)     Status: None (Preliminary result)   Collection Time: 01/23/19 12:31 PM   Specimen: BLOOD  Result Value Ref Range Status   Specimen Description BLOOD BLOOD LEFT HAND  Final   Special Requests AEROBIC BOTTLE ONLY Blood Culture adequate volume  Final   Culture   Final    NO GROWTH 4 DAYS Performed at [facility], [place] [address], [place], [place] [address]    Report Status PENDING  Incomplete  Culture, blood (routine x 2)     Status: None (Preliminary result)   Collection Time: 01/23/19 12:31 PM   Specimen: BLOOD  Result Value Ref Range Status   Specimen Description BLOOD BLOOD LEFT HAND  Final   Special Requests  AEROBIC BOTTLE ONLY Blood Culture adequate volume  Final   Culture   Final    NO GROWTH 4 DAYS Performed at [facility], [place] [address], [place], [place] [address]    Report Status PENDING  Incomplete  Expectorated sputum assessment w rflx to resp cult     Status: None   Collection Time: 01/24/19  5:51 AM   Specimen: Sputum  Result Value Ref Range Status   Specimen Description SPUTUM  Final   Special Requests NONE  Final   Sputum evaluation   Final    THIS SPECIMEN IS ACCEPTABLE FOR SPUTUM CULTURE Performed at [facility], [place] [address], [place], [place] [address]    Report Status 01/24/2019 FINAL  Final  Culture, respiratory     Status: None   Collection Time: 01/24/19  5:51 AM   Specimen: SPU  Result Value Ref Range Status   Specimen Description SPUTUM  Final   Special Requests NONE Reflexed from [id number]  Final   Gram Stain   Final    FEW WBC PRESENT, PREDOMINANTLY PMN ABUNDANT GRAM NEGATIVE RODS ABUNDANT GRAM POSITIVE COCCI IN PAIRS IN CLUSTERS FEW GRAM POSITIVE RODS    Culture   Final    MODERATE Consistent with normal respiratory flora. Performed at [facility], [place] [address] [place], [place] [address]    Report Status 01/26/2019 FINAL  Final    Studies/Results: Ct Chest W Contrast  Result Date: 01/26/2019 CLINICAL DATA:  Fever, recent wound infection following left mastectomy and chest wall revision EXAM: CT CHEST WITH CONTRAST TECHNIQUE: Multidetector CT imaging of the chest was performed during intravenous contrast administration. CONTRAST:  75 mL OMNIPAQUE IOHEXOL 300 MG/ML  SOLN COMPARISON:  Numerous priors, most recent CT January 20, 2019 FINDINGS: Cardiovascular: The ascending aorta is ectatic and calcified. No frank thoracic aortic aneurysm. Central pulmonary arterial enlargement likely reflecting pulmonary artery hypertension. There is stable cardiomegaly with biatrial enlargement. A mitral valve prosthesis noted. Coronary arteries are heavily  calcified with additional surgical clips within the mediastinum possibly related to prior CABG. There  is thickening enhancement of the pericardium at the cardiac apex similar to prior studies and likely reflecting radiation related change. Mediastinum/Nodes: Trachea is unremarkable. No concerning thyroid abnormality. No esophageal abnormality. Redemonstration of mediastinal, hilar and axillary adenopathy. Many of these nodes are low-attenuation are likely reactive in nature. Reference nodes include a 15 mm right axillary lymph node (3/78), stable. A 2 cm precarinal lymph node is similar to prior (3/53). Lungs/Pleura: Multiple areas of radiation related change including fibrosis and pleural thickening in the anterior portion of the left lung as well as more masslike fibrosis in the left lung apex. These areas unchanged on multiple serial comparison images. There is a background of moderate to severe centrilobular emphysema. There is been interval increase in the size a left pleural effusion. Increasing ground-glass opacity in the posterior lung bases likely reflecting atelectasis. No new consolidative opacity. Upper Abdomen: No acute abnormalities present in the visualized portions of the upper abdomen. Musculoskeletal: There are extensive post mastectomy changes on the left with interval removal of the anterior drainage catheter. A posterior chest wall drain remains in place. There has been interval enlargement of the rim enhancing subcutaneous air and fluid collection within the soft tissues of the anterior left shoulder girdle. A surgical drain is positioned inferiorly to these complex collections. A separate drain along the posterior chest wall remains in position with minimal phlegmonous change along the posterior soft tissues. There is overlying skin thickening concerning for abscess formation with cellulitis and while hypoattenuation of the adjacent musculature suspicious for reactive myositis. Sclerosis and  lucency of the subjacent ribs is grossly similar to the prior study. IMPRESSION: 1. Interval enlargement of the rim enhancing subcutaneous air and fluid collection within the soft tissues of the anterior left shoulder girdle, concerning for worsening necrosis and abscess formation. There is overlying skin thickening which may reflect developing cellulitis with areas of cutaneous defect. Hypoattenuation of the adjacent musculature is suspicious for reactive myositis. The anterior surgical drain is positioned inferiorly to these complex collections. 2. Diminishing phlegmonous change seen in the posterior soft tissues. 3. Slight interval increase in the size of a left pleural effusion. 4. Extensive areas of scarring and fibrosis throughout the left lung and along the pleura as well as at the cardiac apex likely reflecting radiation related changes seen on multiple prior exams. 5. Mitral valve replacement 6. Cardiomegaly with biatrial enlargement and coronary atherosclerosis. 7. Aortic Atherosclerosis (ICD10-I70.0). 8.  Emphysema (ICD10-J43.9). These results were called by telephone at the time of interpretation on 01/26/2019 at 10:03 pm to Dr. Posey Pronto, who verbally acknowledged these results. Electronically Signed   By: Lovena Le M.D.   On: 01/26/2019 22:04      Assessment/Plan:  INTERVAL HISTORY: Ct as above and  Had conversation afterwards this am with Dr. Marla Roe   Principal Problem:   Fever Active Problems:   Open wound of chest wall, left, initial encounter   Sepsis ([place])   Tachycardia    Kim Anthony is a 69 y.o. female with remote history of breast cancer with mastectomy and axillary lymph node dissection, XRT. scarring of tissue and chronic wound  with there was recent concern for recurrence of breast cancer status post surgery complicated by abscess which were drained, difficulty with nonhealing wound status post muscle flap  Placement on July 29th.  Then admitted on the 30th with fever and  tachycardia and placed on broad-spectrum antibiotics.  She was feeding 3 antibiotics and I was concerned about deep infection therefore obtained a  CT scan of her chest yesterday which compared to a scan done on the 31st.  The scan is read as showing:  1. Interval enlargement of the rim enhancing subcutaneous air and fluid collection within the soft tissues of the anterior left shoulder girdle, concerning for worsening necrosis and abscess formation. There is overlying skin thickening which may reflect developing cellulitis with areas of cutaneous defect. Hypoattenuation of the adjacent musculature is suspicious for reactive myositis. The anterior surgical drain is positioned inferiorly to these complex collections. 2. Diminishing phlegmonous change seen in the posterior soft tissues. 3. Slight interval increase in the size of a left pleural effusion. 4. Extensive areas of scarring and fibrosis throughout the left lung and along the pleura as well as at the cardiac apex likely reflecting radiation related changes seen on multiple prior exams   I had extensive discussion with Dr. Tito Dine who knows her case intimately well and her read of the area being visualized in the left shoulder girdle is actually not an abscess but latissimus dorsi muscle flap.  He is planning on taking Kim Anthony to the operating room today to examine her wounds and obtain some cultures.  I have asked also for AFB cultures to be checked in case she has a nontuberculous Mycobacterium.  I would continue to observe her off antibiotics and certainly we can obtain blood cultures if there is concern that she is seeded her valve.  I will have my partner Dr. Baxter Flattery check on her over the weekend  I would recommend changing her from Coumadin to a more reversible form of anticoagulation such as Lovenox in case she needs more aggressive surgeries in the near future.  She also has a pleural effusion that could be evaluated.   I would have a low threshold to reimage her in the next week.     LOS: 9 days   Alcide Evener 01/27/2019, 10:57 AM

## 2019-01-27 NOTE — Anesthesia Procedure Notes (Signed)
Procedure Name: Intubation Date/Time: 01/27/2019 4:40 PM Performed by: Clovis Cao, CRNA Pre-anesthesia Checklist: Patient identified, Emergency Drugs available, Suction available, Patient being monitored and Timeout performed Patient Re-evaluated:Patient Re-evaluated prior to induction Oxygen Delivery Method: Circle system utilized Preoxygenation: Pre-oxygenation with 100% oxygen Induction Type: IV induction Ventilation: Mask ventilation without difficulty Laryngoscope Size: Miller and 2 Grade View: Grade I Tube size: 7.5 mm Number of attempts: 1 Airway Equipment and Method: Stylet Placement Confirmation: ETT inserted through vocal cords under direct vision,  positive ETCO2 and breath sounds checked- equal and bilateral Secured at: 22 cm Tube secured with: Tape Dental Injury: Teeth and Oropharynx as per pre-operative assessment

## 2019-01-27 NOTE — Progress Notes (Addendum)
ANTICOAGULATION CONSULT NOTE - Follow Up Consult  Pharmacy Consult for Coumadin > Lovenox Indication:  mech MV and chronic PE  Allergies  Allergen Reactions  . Acetaminophen-Codeine Itching  . Propoxyphene Itching    Darvocet  . Tramadol Itching and Nausea And Vomiting    Patient Measurements: Height: 5\' 5"  (165.1 cm) Weight: 171 lb (77.6 kg) IBW/kg (Calculated) : 57  Vital Signs: Temp: 97.7 F (36.5 C) (08/07 1122) Temp Source: Oral (08/07 1122) BP: 135/77 (08/07 1122) Pulse Rate: 77 (08/07 1122)  Labs: Recent Labs    01/25/19 0715 01/26/19 0207 01/27/19 0653  HGB 8.5* 8.3* 8.1*  HCT 27.3* 27.1* 26.6*  PLT 614* 590* 666*  LABPROT 25.2* 29.9* 33.5*  INR 2.3* 2.9* 3.4*  CREATININE 0.64 0.65 0.80    Estimated Creatinine Clearance: 68.3 mL/min (by C-G formula based on SCr of 0.8 mg/dL).   Assessment: 60 yof on Lovenox and Warfarin for hx mechanical MVR and chronic PE. UE dopplers negative for DVT and CTA negative for acute PE this admit.  INR remains therapeutic today, up to 3.4. Azithromycin d/c'd 8/6 per ID (DDI - likely to cause INR to increase). Hg drifting down to 8.1, plt up to 666. No acute bleed issues documented.  Dr. Marla Roe planning to take patient to OR today to examine wounds/obtain cultures. Per discussion with [facility], d/c warfarin again until procedures complete. Transition to Lovenox when INR<2.5.   PTA Warfarin: 5 mg daily except 7.5 mg on Wed and Sun. Last dose 7/23. INR was 3 that day.   Goal of Therapy:  INR 2.5-3.5 Monitor platelets by anticoagulation protocol: Yes   Plan:  Hold warfarin for now per Dr. Earnest Conroy Lovenox when INR<2.5 Monitor CBC at least q72h, s/sx bleeding F/u resuming warfarin as appropriate when all procedures complete  Elicia Lamp, PharmD, BCPS Please check AMION for all [place] contact numbers Clinical Pharmacist 01/27/2019 1:18 PM

## 2019-01-28 ENCOUNTER — Inpatient Hospital Stay (HOSPITAL_COMMUNITY): Payer: Medicare Other

## 2019-01-28 DIAGNOSIS — J9621 Acute and chronic respiratory failure with hypoxia: Secondary | ICD-10-CM

## 2019-01-28 DIAGNOSIS — L02419 Cutaneous abscess of limb, unspecified: Secondary | ICD-10-CM

## 2019-01-28 DIAGNOSIS — T8132XD Disruption of internal operation (surgical) wound, not elsewhere classified, subsequent encounter: Secondary | ICD-10-CM

## 2019-01-28 LAB — CULTURE, BLOOD (ROUTINE X 2)
Culture: NO GROWTH
Culture: NO GROWTH
Special Requests: ADEQUATE
Special Requests: ADEQUATE

## 2019-01-28 LAB — CBC
HCT: 27.9 % — ABNORMAL LOW (ref 36.0–46.0)
Hemoglobin: 8.8 g/dL — ABNORMAL LOW (ref 12.0–15.0)
MCH: 30.6 pg (ref 26.0–34.0)
MCHC: 31.5 g/dL (ref 30.0–36.0)
MCV: 96.9 fL (ref 80.0–100.0)
Platelets: 699 10*3/uL — ABNORMAL HIGH (ref 150–400)
RBC: 2.88 MIL/uL — ABNORMAL LOW (ref 3.87–5.11)
RDW: 13.4 % (ref 11.5–15.5)
WBC: 13.2 10*3/uL — ABNORMAL HIGH (ref 4.0–10.5)
nRBC: 0.2 % (ref 0.0–0.2)

## 2019-01-28 LAB — ACID FAST SMEAR (AFB, MYCOBACTERIA): Acid Fast Smear: NEGATIVE

## 2019-01-28 LAB — PROTIME-INR
INR: 3 — ABNORMAL HIGH (ref 0.8–1.2)
Prothrombin Time: 30.5 seconds — ABNORMAL HIGH (ref 11.4–15.2)

## 2019-01-28 NOTE — Anesthesia Postprocedure Evaluation (Signed)
Anesthesia Post Note  Patient: Kim Anthony  Procedure(s) Performed: APPLICATION OF A-CELL OF CHEST (Left Chest) APPLICATION OF WOUND VAC (N/A Chest) IRRIGATION AND DEBRIDEMENT OF CHEST WALL (Left Chest)     Patient location during evaluation: PACU Anesthesia Type: General Level of consciousness: sedated Pain management: pain level controlled Vital Signs Assessment: post-procedure vital signs reviewed and stable Respiratory status: spontaneous breathing and respiratory function stable Cardiovascular status: stable Postop Assessment: no apparent nausea or vomiting Anesthetic complications: no               Ashleynicole Mcclees DANIEL

## 2019-01-28 NOTE — Progress Notes (Signed)
PROGRESS NOTE    Kim Anthony  VQM:[id number]  DOB: [birth year]  DOA: 01/18/2019 PCP: Marianna Payment, MD  Brief Narrative:   68 year old female with history of hypertension, COPD, mechanical heart valve, pulmonary embolism on Coumadin, CHF (EF 59 to 55%), COPD, and left breast cancer, who had undergone a left  mastectomy and complete axillary dissection in [place], [place] approximately 18 years ago followed by radiation therapy and anti-hormonal therapy was doing well from a breast standpoint (no longer sees oncologist) until 6 months ago, she developed an abscess and draining wound in her left axilla at the very lateral edge of her mastectomy incision. She was on multiple antibiotics without improvement. She also has left shoulder pain. She has chronic pain with limited mobility of the shoulder. She was seen by general surgery Dr. Ninfa Linden on July 7 and there was concern for deep infection (CT scan 12/19 was unremarkable regarding the chest wall and axilla but an ultrasound January 25 digit showed  ill-defined 3.8 x 3.8 cm area likely representing fluid collections) and patient was planned for OR intervention after discussing with plastic surgery regarding a possible muscle flap.  Post excision, patient presented to the ED on July 19 due to opening of the wound and was seen by plastic surgery on 7/20 for follow-up at which time latissimus muscle flap was recommended.  She underwent this procedure (latissimus flap to the left breast) on July 29 subsequent to which she developed fever 1077F with tachycardia and some hypotension with SBP dipping to 90s.  Patient was admitted to [facility]. under hospitalist service and was started on vancomycin/cefepime for sepsis syndrome.  CTA of the chest did not show any PE but reported extensive subcutaneous edema and inflammatory change involving the left chest wall anteriorly and laterally, extending to the presternal soft tissues, consistent with soft tissue infection as well  as numerous gas and fluid collections within the left anterior chest wall soft tissues, concerning for soft tissue abscess or necrosis.pharmacy has been managing Coumadin and Plastic surgery has been following along for local wound care/dressing changes and drain management. Patient has had leukocytosis as well as intermittent temp spikes during the hospital course (most significantly 1077F on 7/31, 102.77F on 8/3, 100.7 on 8/4 and 100.9 on 8/5).  Azithromycin was added to vancomycin/cefepime yesterday (8/5).  Her last COVID testing was on 7/27 which was negative.  Subjective:  Tmax of 99 F in last 24 hours. Been off antibiotics. Appears comfortable. On 3.5 lits O2 [place].   Seen by  plastic surgery and went to OR yesterday for cultures.  INR therapeutic at 3.0  today.   Objective: Vitals:   01/28/19 0023 01/28/19 0400 01/28/19 0801 01/28/19 1221  BP: 115/66  (!) 113/56 134/80  Pulse: 87  89 92  Resp:    20  Temp:  98.8 F (37.1 C)  99 F (37.2 C)  TempSrc:  Oral  Oral  SpO2:   (!) 88% 92%  Weight:      Height:        Intake/Output Summary (Last 24 hours) at 01/28/2019 1454 Last data filed at 01/27/2019 1940 Gross per 24 hour  Intake 900 ml  Output 50 ml  Net 850 ml   Filed Weights   01/18/19 1900 01/27/19 1431  Weight: 77.6 kg 77.6 kg    Physical Examination:  General exam: Appears calm and comfortable  Respiratory system: Clear to auscultation. Decreased BS at bases. Respiratory effort normal. Cardiovascular system: S1 & S2 heard,  RRR. No JVD, murmurs, rubs, gallops or clicks. No pedal edema. Gastrointestinal system: Abdomen is nondistended, soft and nontender. No organomegaly or masses felt. Normal bowel sounds heard. Central nervous system: Alert and oriented. No focal neurological deficits. Extremities: Symmetric 5 x 5 power. Skin: Left back incision dressing/anterior chest wall dressing/breast binder with drains in place. Psychiatry: Judgement and insight appear normal. Mood &  affect appropriate.     Data Reviewed: I have personally reviewed following labs and imaging studies  CBC: Recent Labs  Lab 01/22/19 0745 01/23/19 0402 01/24/19 8299 01/25/19 0715 01/26/19 0207 01/27/19 0653 01/28/19 0441  WBC 15.5* 12.2* 12.9* 15.8* 15.7* 13.6* 13.2*  NEUTROABS 11.6* 8.3* 9.7* 11.1*  --   --   --   HGB 8.6* 9.3* 8.3* 8.5* 8.3* 8.1* 8.8*  HCT 27.6* 30.0* 26.0* 27.3* 27.1* 26.6* 27.9*  MCV 97.9 97.4 95.6 97.2 99.3 98.2 96.9  PLT 412* 369 480* 614* 590* 666* 371*   Basic Metabolic Panel: Recent Labs  Lab 01/23/19 0402 01/24/19 0842 01/25/19 0715 01/26/19 0207 01/27/19 0653  NA 132* 134* 136 135 136  K 3.6 2.9* 3.6 3.2* 3.6  CL 102 102 104 104 107  CO2 19* 20* 22 19* 19*  GLUCOSE 117* 154* 129* 98 130*  BUN 11 9 8  7* 7*  CREATININE 0.73 0.76 0.64 0.65 0.80  CALCIUM 8.3* 8.5* 8.7* 8.3* 8.5*  MG  --  1.7  --   --   --   PHOS  --  2.4*  --   --   --    GFR: Estimated Creatinine Clearance: 68.3 mL/min (by C-G formula based on SCr of 0.8 mg/dL). Liver Function Tests: No results for input(s): AST, ALT, ALKPHOS, BILITOT, PROT, ALBUMIN in the last 168 hours. No results for input(s): LIPASE, AMYLASE in the last 168 hours. No results for input(s): AMMONIA in the last 168 hours. Coagulation Profile: Recent Labs  Lab 01/24/19 0842 01/25/19 0715 01/26/19 0207 01/27/19 0653 01/28/19 0441  INR 1.8* 2.3* 2.9* 3.4* 3.0*   Cardiac Enzymes: No results for input(s): CKTOTAL, CKMB, CKMBINDEX, TROPONINI in the last 168 hours. BNP (last 3 results) No results for input(s): PROBNP in the last 8760 hours. HbA1C: No results for input(s): HGBA1C in the last 72 hours. CBG: No results for input(s): GLUCAP in the last 168 hours. Lipid Profile: No results for input(s): CHOL, HDL, LDLCALC, TRIG, CHOLHDL, LDLDIRECT in the last 72 hours. Thyroid Function Tests: No results for input(s): TSH, T4TOTAL, FREET4, T3FREE, THYROIDAB in the last 72 hours. Anemia Panel: No  results for input(s): VITAMINB12, FOLATE, FERRITIN, TIBC, IRON, RETICCTPCT in the last 72 hours. Sepsis Labs: No results for input(s): PROCALCITON, LATICACIDVEN in the last 168 hours.  Recent Results (from the past 240 hour(s))  Culture, blood (routine x 2)     Status: None   Collection Time: 01/19/19  9:41 PM   Specimen: BLOOD  Result Value Ref Range Status   Specimen Description BLOOD RIGHT WRIST  Final   Special Requests   Final    BOTTLES DRAWN AEROBIC ONLY Blood Culture adequate volume   Culture   Final    NO GROWTH 5 DAYS Performed at [facility], [address], [place], [place] [address]    Report Status 01/24/2019 FINAL  Final  Culture, blood (routine x 2)     Status: None   Collection Time: 01/19/19  9:41 PM   Specimen: BLOOD  Result Value Ref Range Status   Specimen Description BLOOD  RIGHT HAND  Final   Special Requests   Final    BOTTLES DRAWN AEROBIC ONLY Blood Culture adequate volume   Culture   Final    NO GROWTH 5 DAYS Performed at [facility], [address] [place], [place] [address]    Report Status 01/24/2019 FINAL  Final  Culture, blood (routine x 2)     Status: None   Collection Time: 01/23/19 12:31 PM   Specimen: BLOOD  Result Value Ref Range Status   Specimen Description BLOOD BLOOD LEFT HAND  Final   Special Requests AEROBIC BOTTLE ONLY Blood Culture adequate volume  Final   Culture   Final    NO GROWTH 5 DAYS Performed at [facility], [place] [address], [place], [place] [address]    Report Status 01/28/2019 FINAL  Final  Culture, blood (routine x 2)     Status: None   Collection Time: 01/23/19 12:31 PM   Specimen: BLOOD  Result Value Ref Range Status   Specimen Description BLOOD BLOOD LEFT HAND  Final   Special Requests AEROBIC BOTTLE ONLY Blood Culture adequate volume  Final   Culture   Final    NO GROWTH 5 DAYS Performed at [facility], [place] [address] [place], [place] [address]    Report Status 01/28/2019 FINAL   Final  Expectorated sputum assessment w rflx to resp cult     Status: None   Collection Time: 01/24/19  5:51 AM   Specimen: Sputum  Result Value Ref Range Status   Specimen Description SPUTUM  Final   Special Requests NONE  Final   Sputum evaluation   Final    THIS SPECIMEN IS ACCEPTABLE FOR SPUTUM CULTURE Performed at [facility], [place] [address] [place], [place] [address]    Report Status 01/24/2019 FINAL  Final  Culture, respiratory     Status: None   Collection Time: 01/24/19  5:51 AM   Specimen: SPU  Result Value Ref Range Status   Specimen Description SPUTUM  Final   Special Requests NONE Reflexed from [id number]  Final   Gram Stain   Final    FEW WBC PRESENT, PREDOMINANTLY PMN ABUNDANT GRAM NEGATIVE RODS ABUNDANT GRAM POSITIVE COCCI IN PAIRS IN CLUSTERS FEW GRAM POSITIVE RODS    Culture   Final    MODERATE Consistent with normal respiratory flora. Performed at [facility], [place] [address], [place], [place] [address]    Report Status 01/26/2019 FINAL  Final  Surgical pcr screen     Status: None   Collection Time: 01/27/19 11:49 AM   Specimen: Nasal Mucosa; Nasal Swab  Result Value Ref Range Status   MRSA, PCR NEGATIVE NEGATIVE Final   Staphylococcus aureus NEGATIVE NEGATIVE Final    Comment: (NOTE) The Xpert SA Assay (FDA approved for NASAL specimens in patients 77 years of age and older), is one component of a comprehensive surveillance program. It is not intended to diagnose infection nor to guide or monitor treatment. Performed at [facility], [place] [address], [place], [place] [address]   Aerobic/Anaerobic Culture (surgical/deep wound)     Status: None (Preliminary result)   Collection Time: 01/27/19  6:18 PM   Specimen: Chest; Wound  Result Value Ref Range Status   Specimen Description WOUND  Final   Special Requests LEFT CHEST WALL  Final   Gram Stain NO WBC SEEN NO ORGANISMS SEEN   Final   Culture   Final  TOO YOUNG TO READ Performed  at [facility], [place] [address], [place], [place] [address]    Report Status PENDING  Incomplete      Radiology Studies: Ct Chest W Contrast  Result Date: 01/26/2019 CLINICAL DATA:  Fever, recent wound infection following left mastectomy and chest wall revision EXAM: CT CHEST WITH CONTRAST TECHNIQUE: Multidetector CT imaging of the chest was performed during intravenous contrast administration. CONTRAST:  75 mL OMNIPAQUE IOHEXOL 300 MG/ML  SOLN COMPARISON:  Numerous priors, most recent CT January 20, 2019 FINDINGS: Cardiovascular: The ascending aorta is ectatic and calcified. No frank thoracic aortic aneurysm. Central pulmonary arterial enlargement likely reflecting pulmonary artery hypertension. There is stable cardiomegaly with biatrial enlargement. A mitral valve prosthesis noted. Coronary arteries are heavily calcified with additional surgical clips within the mediastinum possibly related to prior CABG. There is thickening enhancement of the pericardium at the cardiac apex similar to prior studies and likely reflecting radiation related change. Mediastinum/Nodes: Trachea is unremarkable. No concerning thyroid abnormality. No esophageal abnormality. Redemonstration of mediastinal, hilar and axillary adenopathy. Many of these nodes are low-attenuation are likely reactive in nature. Reference nodes include a 15 mm right axillary lymph node (3/78), stable. A 2 cm precarinal lymph node is similar to prior (3/53). Lungs/Pleura: Multiple areas of radiation related change including fibrosis and pleural thickening in the anterior portion of the left lung as well as more masslike fibrosis in the left lung apex. These areas unchanged on multiple serial comparison images. There is a background of moderate to severe centrilobular emphysema. There is been interval increase in the size a left pleural effusion. Increasing ground-glass opacity in the posterior lung bases likely reflecting atelectasis. No new  consolidative opacity. Upper Abdomen: No acute abnormalities present in the visualized portions of the upper abdomen. Musculoskeletal: There are extensive post mastectomy changes on the left with interval removal of the anterior drainage catheter. A posterior chest wall drain remains in place. There has been interval enlargement of the rim enhancing subcutaneous air and fluid collection within the soft tissues of the anterior left shoulder girdle. A surgical drain is positioned inferiorly to these complex collections. A separate drain along the posterior chest wall remains in position with minimal phlegmonous change along the posterior soft tissues. There is overlying skin thickening concerning for abscess formation with cellulitis and while hypoattenuation of the adjacent musculature suspicious for reactive myositis. Sclerosis and lucency of the subjacent ribs is grossly similar to the prior study. IMPRESSION: 1. Interval enlargement of the rim enhancing subcutaneous air and fluid collection within the soft tissues of the anterior left shoulder girdle, concerning for worsening necrosis and abscess formation. There is overlying skin thickening which may reflect developing cellulitis with areas of cutaneous defect. Hypoattenuation of the adjacent musculature is suspicious for reactive myositis. The anterior surgical drain is positioned inferiorly to these complex collections. 2. Diminishing phlegmonous change seen in the posterior soft tissues. 3. Slight interval increase in the size of a left pleural effusion. 4. Extensive areas of scarring and fibrosis throughout the left lung and along the pleura as well as at the cardiac apex likely reflecting radiation related changes seen on multiple prior exams. 5. Mitral valve replacement 6. Cardiomegaly with biatrial enlargement and coronary atherosclerosis. 7. Aortic Atherosclerosis (ICD10-I70.0). 8.  Emphysema (ICD10-J43.9). These results were called by telephone at the  time of interpretation on 01/26/2019 at 10:03 pm to Dr. Posey Pronto, who verbally acknowledged these results. Electronically Signed   By: Elwin Sleight.D.  On: 01/26/2019 22:04        Scheduled Meds:  dextromethorphan-guaiFENesin  1 tablet Oral BID   furosemide  40 mg Oral Daily   gabapentin  600 mg Oral BID   metoprolol succinate  12.5 mg Oral QHS   potassium chloride  20 mEq Oral Daily   senna  1 tablet Oral BID   simvastatin  20 mg Oral Daily   umeclidinium-vilanterol  1 puff Inhalation Daily   Continuous Infusions:   Assessment & Plan:    1.  Sepsis secondary to postoperative wound infection: Present on admission leukocytosis downtrending.  (peaked to 21 K during the hospital course, today at 13 K) and afebrile. Appreciate infectious diseases input.  Repeat CT scan chest concerning for worsening necrosis and increasing size of fluid pockets. Per ID , held antibiotics for now and patient underwent OR wound exploration for deep cx by plastic surgery: Per Dr Marla Roe cultures were obtained from the lateral deep aspect of the wound including AFB.  No purulence seen. Wound care/drain and wound vac mx per Plastic surgery has been following along  and they have been performing local wound care with ACell/[place] jelly (please refer to their note for details).  Blood cultures from 7/30 and 8/3 negative so far.  2.  History of PE/mechanical heart valve: Patient been on heparin/Coumadin  with INR goal 2.5-3.5.  Discussed with pharmacy regarding holding Coumadin in view of anticipated procedures as suggested by ID.  Heparin drip can be resumed once INR less than 2.5  3.  Acute hypoxic respiratory failure: Patient been on O2 since OR intervention on admission.Requiring 3-4 lits O2.  Last CXR from 8/3 unremarkable. CT chest 8/6 reported Slight interval increase in the size of a left pleural effusion and extensive areas of scarring and fibrosis throughout the left lung and along the pleura as well as  at the cardiac apex likely reflecting radiation related changes seen on multiple prior exams. Will repeat CXR for f/u. Incentive spirometry. Taper 02 as tolerated. Not on IV fluids/IV abx currently.   4.  Breast cancer: Status post mastectomy/radiation/hormone treatment many years back.  No longer sees an oncologist.  Most recent CT scans suggest reactive lymphadenopathy and she does have significant wound infection.  5. COPD/emphysema: Stable currently without any wheezing. Denies using home 02.  Saturating well on 3 lits [place]. CT chest did show emphysematous changes and fibrotic changes along left upper lobe anterior and apical fibrosis, No change in 6 mm right apical lung nodule and right axillary/mediastinal reactive lymphadenopathy.  MDI/nebs as needed   6.  Hypokalemia: Replace as needed.  Now on 20 mEq daily.   7. Chronic left shoulder pain: Could be related to wound infection.  Pain meds as needed.  X-ray of the left shoulder was negative for any acute issues in January.   DVT prophylaxis: On anticoagulation, INR greater than 2 Code Status: Full code Family / Patient Communication: Discussed with patient in detail.  Disposition Plan: TBD     LOS: 10 days    Time spent: 35 minutes    Guilford Shi, MD [facility] Pager [phone]  If 7PM-7AM, please contact night-coverage [email or web address] Password [email or web address] 01/28/2019, 2:54 PM

## 2019-01-29 DIAGNOSIS — X58XXXA Exposure to other specified factors, initial encounter: Secondary | ICD-10-CM

## 2019-01-29 DIAGNOSIS — Z978 Presence of other specified devices: Secondary | ICD-10-CM

## 2019-01-29 DIAGNOSIS — S21102A Unspecified open wound of left front wall of thorax without penetration into thoracic cavity, initial encounter: Secondary | ICD-10-CM

## 2019-01-29 DIAGNOSIS — Z9010 Acquired absence of unspecified breast and nipple: Secondary | ICD-10-CM

## 2019-01-29 LAB — CBC
HCT: 27.9 % — ABNORMAL LOW (ref 36.0–46.0)
Hemoglobin: 8.4 g/dL — ABNORMAL LOW (ref 12.0–15.0)
MCH: 30.5 pg (ref 26.0–34.0)
MCHC: 30.1 g/dL (ref 30.0–36.0)
MCV: 101.5 fL — ABNORMAL HIGH (ref 80.0–100.0)
Platelets: 726 10*3/uL — ABNORMAL HIGH (ref 150–400)
RBC: 2.75 MIL/uL — ABNORMAL LOW (ref 3.87–5.11)
RDW: 13.8 % (ref 11.5–15.5)
WBC: 14.2 10*3/uL — ABNORMAL HIGH (ref 4.0–10.5)
nRBC: 0.2 % (ref 0.0–0.2)

## 2019-01-29 LAB — BASIC METABOLIC PANEL
Anion gap: 10 (ref 5–15)
BUN: 11 mg/dL (ref 8–23)
CO2: 24 mmol/L (ref 22–32)
Calcium: 8.8 mg/dL — ABNORMAL LOW (ref 8.9–10.3)
Chloride: 102 mmol/L (ref 98–111)
Creatinine, Ser: 0.98 mg/dL (ref 0.44–1.00)
GFR calc Af Amer: >60 mL/min (ref >60)
GFR calc non Af Amer: 59 mL/min — ABNORMAL LOW (ref >60)
Glucose, Bld: 110 mg/dL — ABNORMAL HIGH (ref 70–99)
Potassium: 3.1 mmol/L — ABNORMAL LOW (ref 3.5–5.1)
Sodium: 136 mmol/L (ref 135–145)

## 2019-01-29 LAB — PROTIME-INR
INR: 3 — ABNORMAL HIGH (ref 0.8–1.2)
Prothrombin Time: 30.9 seconds — ABNORMAL HIGH (ref 11.4–15.2)

## 2019-01-29 MED ORDER — FUROSEMIDE 10 MG/ML IJ SOLN
20.0000 mg | Freq: Once | INTRAMUSCULAR | Status: AC
  Administered 2019-01-29: 11:00:00 20 mg via INTRAVENOUS
  Filled 2019-01-29: qty 2

## 2019-01-29 MED ORDER — SODIUM CHLORIDE 0.9 % IV SOLN
2.0000 g | Freq: Three times a day (TID) | INTRAVENOUS | Status: DC
  Administered 2019-01-29 – 2019-01-30 (×3): 2 g via INTRAVENOUS
  Filled 2019-01-29 (×5): qty 2

## 2019-01-29 NOTE — Progress Notes (Addendum)
PROGRESS NOTE    Kim Anthony  CZY:[id number]  DOB: July 11, 1950  DOA: 01/18/2019 PCP: Marianna Payment, MD  Brief Narrative:   68 year old female with history of hypertension, COPD, mechanical heart valve, pulmonary embolism on Coumadin, CHF (EF 83 to 55%), COPD, and left breast cancer, who had undergone a left  mastectomy and complete axillary dissection in [place] approximately 18 years ago followed by radiation therapy and anti-hormonal therapy was doing well from a breast standpoint (no longer sees oncologist) until 6 months ago, she developed an abscess and draining wound in her left axilla at the very lateral edge of her mastectomy incision. She was on multiple antibiotics without improvement. She also has left shoulder pain. She has chronic pain with limited mobility of the shoulder. She was seen by general surgery Dr. Ninfa Linden on July 7 and there was concern for deep infection (CT scan 12/19 was unremarkable regarding the chest wall and axilla but an ultrasound January 25 digit showed  ill-defined 3.8 x 3.8 cm area likely representing fluid collections) and patient was planned for OR intervention after discussing with plastic surgery regarding a possible muscle flap.  Post excision, patient presented to the ED on July 19 due to opening of the wound and was seen by plastic surgery on 7/20 for follow-up at which time latissimus muscle flap was recommended.  She underwent this procedure (latissimus flap to the left breast) on July 29 subsequent to which she developed fever 1027F with tachycardia and some hypotension with SBP dipping to 90s.  Patient was admitted to [facility]. under hospitalist service and was started on vancomycin/cefepime for sepsis syndrome.  CTA of the chest did not show any PE but reported extensive subcutaneous edema and inflammatory change involving the left chest wall anteriorly and laterally, extending to the presternal soft tissues, consistent with soft tissue infection as well  as numerous gas and fluid collections within the left anterior chest wall soft tissues, concerning for soft tissue abscess or necrosis.pharmacy has been managing Coumadin and Plastic surgery has been following along for local wound care/dressing changes and drain management. Patient has had leukocytosis as well as intermittent temp spikes during the hospital course (most significantly 1027F on 7/31, 102.27F on 8/3, 100.7 on 8/4 and 100.9 on 8/5).  Azithromycin was added to vancomycin/cefepime yesterday (8/5).  Her last COVID testing was on 7/27 which was negative.  Subjective:  Patient off nasal cannula when I walked into the room. Wall device showing 3.5 lits O2. She states she has been using only if she felt short of breath. Currently feels wheezy and asking for neb rx. Bedside pulsox 92% on RA. Afebrile in last 24 hours.    Objective: Vitals:   01/28/19 1950 01/28/19 2345 01/29/19 0310 01/29/19 0807  BP: 132/74 102/76 (!) 154/63 (!) 105/58  Pulse: 98 97 94 82  Resp: 18 16 16 20   Temp: 98.5 F (36.9 C) 98.7 F (37.1 C) 98.4 F (36.9 C) 98.3 F (36.8 C)  TempSrc: Oral Oral Oral Oral  SpO2: (!) 88% 95% 97% 94%  Weight:      Height:        Intake/Output Summary (Last 24 hours) at 01/29/2019 1013 Last data filed at 01/29/2019 0900 Gross per 24 hour  Intake 480 ml  Output 221 ml  Net 259 ml   Filed Weights   01/18/19 1900 01/27/19 1431  Weight: 77.6 kg 77.6 kg    Physical Examination:  General exam: Appears calm and comfortable  Respiratory system:  Clear to auscultation. Decreased BS at bases. Respiratory effort normal. Cardiovascular system: S1 & S2 heard, RRR. No JVD, murmurs, rubs, gallops or clicks. No pedal edema. Gastrointestinal system: Abdomen is nondistended, soft and nontender. No organomegaly or masses felt. Normal bowel sounds heard. Central nervous system: Alert and oriented. No focal neurological deficits. Extremities: Symmetric 5 x 5 power. Skin: Left back incision  dressing/anterior chest wall dressing/breast binder with drains in place. Psychiatry: Judgement and insight appear normal. Mood & affect appropriate.     Data Reviewed: I have personally reviewed following labs and imaging studies  CBC: Recent Labs  Lab 01/23/19 0402 01/24/19 2500 01/25/19 0715 01/26/19 0207 01/27/19 0653 01/28/19 0441 01/29/19 0215  WBC 12.2* 12.9* 15.8* 15.7* 13.6* 13.2* 14.2*  NEUTROABS 8.3* 9.7* 11.1*  --   --   --   --   HGB 9.3* 8.3* 8.5* 8.3* 8.1* 8.8* 8.4*  HCT 30.0* 26.0* 27.3* 27.1* 26.6* 27.9* 27.9*  MCV 97.4 95.6 97.2 99.3 98.2 96.9 101.5*  PLT 369 480* 614* 590* 666* 699* 370*   Basic Metabolic Panel: Recent Labs  Lab 01/23/19 0402 01/24/19 0842 01/25/19 0715 01/26/19 0207 01/27/19 0653  NA 132* 134* 136 135 136  K 3.6 2.9* 3.6 3.2* 3.6  CL 102 102 104 104 107  CO2 19* 20* 22 19* 19*  GLUCOSE 117* 154* 129* 98 130*  BUN 11 9 8  7* 7*  CREATININE 0.73 0.76 0.64 0.65 0.80  CALCIUM 8.3* 8.5* 8.7* 8.3* 8.5*  MG  --  1.7  --   --   --   PHOS  --  2.4*  --   --   --    GFR: Estimated Creatinine Clearance: 68.3 mL/min (by C-G formula based on SCr of 0.8 mg/dL). Liver Function Tests: No results for input(s): AST, ALT, ALKPHOS, BILITOT, PROT, ALBUMIN in the last 168 hours. No results for input(s): LIPASE, AMYLASE in the last 168 hours. No results for input(s): AMMONIA in the last 168 hours. Coagulation Profile: Recent Labs  Lab 01/25/19 0715 01/26/19 0207 01/27/19 0653 01/28/19 0441 01/29/19 0215  INR 2.3* 2.9* 3.4* 3.0* 3.0*   Cardiac Enzymes: No results for input(s): CKTOTAL, CKMB, CKMBINDEX, TROPONINI in the last 168 hours. BNP (last 3 results) No results for input(s): PROBNP in the last 8760 hours. HbA1C: No results for input(s): HGBA1C in the last 72 hours. CBG: No results for input(s): GLUCAP in the last 168 hours. Lipid Profile: No results for input(s): CHOL, HDL, LDLCALC, TRIG, CHOLHDL, LDLDIRECT in the last 72 hours.  Thyroid Function Tests: No results for input(s): TSH, T4TOTAL, FREET4, T3FREE, THYROIDAB in the last 72 hours. Anemia Panel: No results for input(s): VITAMINB12, FOLATE, FERRITIN, TIBC, IRON, RETICCTPCT in the last 72 hours. Sepsis Labs: No results for input(s): PROCALCITON, LATICACIDVEN in the last 168 hours.  Recent Results (from the past 240 hour(s))  Culture, blood (routine x 2)     Status: None   Collection Time: 01/19/19  9:41 PM   Specimen: BLOOD  Result Value Ref Range Status   Specimen Description BLOOD RIGHT WRIST  Final   Special Requests   Final    BOTTLES DRAWN AEROBIC ONLY Blood Culture adequate volume   Culture   Final    NO GROWTH 5 DAYS Performed at [facility], [address], [place], [place] [address]    Report Status 01/24/2019 FINAL  Final  Culture, blood (routine x 2)     Status: None   Collection Time: 01/19/19  9:41 PM   Specimen: BLOOD  Result Value Ref Range Status   Specimen Description BLOOD RIGHT HAND  Final   Special Requests   Final    BOTTLES DRAWN AEROBIC ONLY Blood Culture adequate volume   Culture   Final    NO GROWTH 5 DAYS Performed at [facility], [address], [place], [place] [address]    Report Status 01/24/2019 FINAL  Final  Culture, blood (routine x 2)     Status: None   Collection Time: 01/23/19 12:31 PM   Specimen: BLOOD  Result Value Ref Range Status   Specimen Description BLOOD BLOOD LEFT HAND  Final   Special Requests AEROBIC BOTTLE ONLY Blood Culture adequate volume  Final   Culture   Final    NO GROWTH 5 DAYS Performed at [facility], [place] [address] [place], [place] [address]    Report Status 01/28/2019 FINAL  Final  Culture, blood (routine x 2)     Status: None   Collection Time: 01/23/19 12:31 PM   Specimen: BLOOD  Result Value Ref Range Status   Specimen Description BLOOD BLOOD LEFT HAND  Final   Special Requests AEROBIC BOTTLE ONLY Blood Culture adequate volume  Final   Culture   Final     NO GROWTH 5 DAYS Performed at [facility], [place] [address], [place], [place] [address]    Report Status 01/28/2019 FINAL  Final  Expectorated sputum assessment w rflx to resp cult     Status: None   Collection Time: 01/24/19  5:51 AM   Specimen: Sputum  Result Value Ref Range Status   Specimen Description SPUTUM  Final   Special Requests NONE  Final   Sputum evaluation   Final    THIS SPECIMEN IS ACCEPTABLE FOR SPUTUM CULTURE Performed at [facility], [place] [address], [place], [place] [address]    Report Status 01/24/2019 FINAL  Final  Culture, respiratory     Status: None   Collection Time: 01/24/19  5:51 AM   Specimen: SPU  Result Value Ref Range Status   Specimen Description SPUTUM  Final   Special Requests NONE Reflexed from [id number]  Final   Gram Stain   Final    FEW WBC PRESENT, PREDOMINANTLY PMN ABUNDANT GRAM NEGATIVE RODS ABUNDANT GRAM POSITIVE COCCI IN PAIRS IN CLUSTERS FEW GRAM POSITIVE RODS    Culture   Final    MODERATE Consistent with normal respiratory flora. Performed at [facility], [place] [address] [place], [place] [address]    Report Status 01/26/2019 FINAL  Final  Surgical pcr screen     Status: None   Collection Time: 01/27/19 11:49 AM   Specimen: Nasal Mucosa; Nasal Swab  Result Value Ref Range Status   MRSA, PCR NEGATIVE NEGATIVE Final   Staphylococcus aureus NEGATIVE NEGATIVE Final    Comment: (NOTE) The Xpert SA Assay (FDA approved for NASAL specimens in patients 62 years of age and older), is one component of a comprehensive surveillance program. It is not intended to diagnose infection nor to guide or monitor treatment. Performed at [facility], [place] [address], [place],  [address]   Aerobic/Anaerobic Culture (surgical/deep wound)     Status: None (Preliminary result)   Collection Time: 01/27/19  6:18 PM   Specimen: Chest; Wound  Result Value Ref Range Status   Specimen Description WOUND  Final   Special  Requests LEFT CHEST WALL  Final  Gram Stain NO WBC SEEN NO ORGANISMS SEEN   Final   Culture   Final    TOO YOUNG TO READ Performed at [facility], [place] [address], [place], [place] [address]    Report Status PENDING  Incomplete  Acid Fast Smear (AFB)     Status: None   Collection Time: 01/27/19  6:19 PM   Specimen: Chest; Wound  Result Value Ref Range Status   AFB Specimen Processing Concentration  Final   Acid Fast Smear Negative  Final    Comment: (NOTE) Performed At: [facility] [place], [place] [id number] Rush Farmer MD [id number]    Source (AFB) WOUND  Final    Comment: LEFT CHEST WALL Performed at [facility], [place] [address] [place], [place] [address]       Radiology Studies: Dg Chest 2 View  Result Date: 01/28/2019 CLINICAL DATA:  Hypoxia. EXAM: CHEST - 2 VIEW COMPARISON:  January 23, 2019 FINDINGS: An air-fluid level in the left upper chest has decreased in the interval consistent with recent surgery. Surgical clips are seen in the left axilla. Cardiomegaly. New bilateral pulmonary opacities are suggestive of pulmonary edema versus atypical infection. No pneumothorax. No other abnormalities. IMPRESSION: Findings are most suggestive cardiomegaly and pulmonary edema. The diffuse opacities could represent sequela of atypical infection instead. Decreasing air in the left chest wall soft tissues consistent with previous surgery. Electronically Signed   By: Dorise Bullion III M.D   On: 01/28/2019 18:49        Scheduled Meds: . dextromethorphan-guaiFENesin  1 tablet Oral BID  . furosemide  40 mg Oral Daily  . gabapentin  600 mg Oral BID  . metoprolol succinate  12.5 mg Oral QHS  . potassium chloride  20 mEq Oral Daily  . senna  1 tablet Oral BID  . simvastatin  20 mg Oral Daily  . umeclidinium-vilanterol  1 puff Inhalation Daily   Continuous Infusions:   Assessment & Plan:    1.  Sepsis secondary to postoperative wound  infection: Present on admission leukocytosis downtrending (peaked to 21 K during the hospital course, down trended, now stable around 14k) and afebrile. Appreciate infectious diseases input. Repeat CT scan chest 8/6 was concerning for worsening necrosis and increasing size of fluid pockets. Per ID , held antibiotics for now and patient underwent OR wound exploration for deep cx (results pending) by plastic surgery: Per Dr Marla Roe cultures were obtained from the lateral deep aspect of the wound including AFB.  No purulence seen. Wound care/drain and wound vac mx per Plastic surgery who has been following along  and they have been performing local wound care with ACell/KY jelly (please refer to their note for details).  Blood cultures from 7/30 and 8/3 negative so far. ID to f/u in am  2.  History of PE/mechanical heart valve: Patient been on heparin/Coumadin  with INR goal 2.5-3.5.  Discussed with pharmacy regarding holding Coumadin in view of anticipated procedures as suggested by ID.  Heparin drip can be resumed once INR less than 2.5  3.  Acute hypoxic respiratory failure: Patient been on O2 since OR intervention on admission.Requiring 3-4 lits O2 intermittently. Neb rx x1 for wheezing (very mild on exam). Repeat CXR 8/8 suggestive of pulmonary edema. Will give lasix x1 . Currently not on IV fluids/IV abx currently.  CT chest 8/6 reported slight interval increase in the size of a left pleural effusion and extensive areas of radiation related scarring  and fibrosis throughout the left lung/cardiac apex. Taper 02 as tolerated. May need home 02 eval prior to discharge. Incentive spirometry.  4.  Breast cancer: Status post mastectomy/radiation/hormone treatment many years back.  No longer sees an oncologist.  Most recent CT scans suggest reactive lymphadenopathy and she does have significant wound infection.  5. COPD/emphysema: Stable currently without any wheezing. Denies using home 02.  Saturating well on  3 lits [place]. CT chest did show emphysematous changes and fibrotic changes along left upper lobe anterior and apical fibrosis, No change in 6 mm right apical lung nodule and right axillary/mediastinal reactive lymphadenopathy.  MDI/nebs as needed   6.  Hypokalemia: Replace as needed.  Now on 20 mEq daily. BMP in am   7. Chronic left shoulder pain: Could be related to wound infection.  Pain meds as needed.  X-ray of the left shoulder was negative for any acute issues in January.   DVT prophylaxis: On anticoagulation, INR greater than 2 Code Status: Full code Family / Patient Communication: Discussed with patient in detail.  Disposition Plan: TBD. May benefit from CIR eval. Complex wound care needs. PT eval     LOS: 11 days    Time spent: 35 minutes    Guilford Shi, MD [facility] Pager [phone]  If 7PM-7AM, please contact night-coverage [email or web address] Password [email or web address] 01/29/2019, 10:13 AM

## 2019-01-29 NOTE — Progress Notes (Signed)
[facility] for Infectious Disease    Date of Admission:  01/18/2019   Total days of antibiotics 11           ID: Kim Anthony is a 68 y.o. female with hx of breast cancer s/p mastectomy, treatment roughly 65yrs ago but started to have breakdown of the left chest after a flap inJuly s/p I x D, afebrile x 48hr Principal Problem:   Fever Active Problems:   Open wound of chest wall, left, initial encounter   Sepsis ([place])   Tachycardia    Subjective: Afebrile, feeling better. Not significant pain to the chest  Medications:  . dextromethorphan-guaiFENesin  1 tablet Oral BID  . furosemide  40 mg Oral Daily  . gabapentin  600 mg Oral BID  . metoprolol succinate  12.5 mg Oral QHS  . potassium chloride  20 mEq Oral Daily  . senna  1 tablet Oral BID  . simvastatin  20 mg Oral Daily  . umeclidinium-vilanterol  1 puff Inhalation Daily    Objective: Vital signs in last 24 hours: Temp:  [97.9 F (36.6 C)-98.7 F (37.1 C)] 98.1 F (36.7 C) (08/09 1232) Pulse Rate:  [79-98] 79 (08/09 1232) Resp:  [16-20] 20 (08/09 0807) BP: (102-154)/(58-82) 127/82 (08/09 1232) SpO2:  [88 %-97 %] 94 % (08/09 0807) Physical Exam  Constitutional:  oriented to person, place, and time. appears well-developed and well-nourished. No distress.  HENT: [place]/AT, PERRLA, no scleral icterus Mouth/Throat: Oropharynx is clear and moist. No oropharyngeal exudate.  Cardiovascular: Normal rate, regular rhythm and normal heart sounds. Exam reveals no gallop and no friction rub.  No murmur heard.  Pulmonary/Chest: Effort normal and breath sounds normal. No respiratory distress.  has no wheezes.  Chest wall = wound vac in place right side, 2 jp drain, serosanginous filled Abdominal: Soft. Bowel sounds are normal.  exhibits no distension. There is no tenderness.  Lymphadenopathy: no cervical adenopathy. No axillary adenopathy Neurological: alert and oriented to person, place, and time.  Skin: Skin is warm and dry.  No rash noted. No erythema.  Psychiatric: a normal mood and affect.  behavior is normal.    Lab Results Recent Labs    01/27/19 0653 01/28/19 0441 01/29/19 0215  WBC 13.6* 13.2* 14.2*  HGB 8.1* 8.8* 8.4*  HCT 26.6* 27.9* 27.9*  NA 136  --   --   K 3.6  --   --   CL 107  --   --   CO2 19*  --   --   BUN 7*  --   --   CREATININE 0.80  --   --     Microbiology: 8/7 rare GNR pending ID 8/7 AFB smear negative Studies/Results: Dg Chest 2 View  Result Date: 01/28/2019 CLINICAL DATA:  Hypoxia. EXAM: CHEST - 2 VIEW COMPARISON:  January 23, 2019 FINDINGS: An air-fluid level in the left upper chest has decreased in the interval consistent with recent surgery. Surgical clips are seen in the left axilla. Cardiomegaly. New bilateral pulmonary opacities are suggestive of pulmonary edema versus atypical infection. No pneumothorax. No other abnormalities. IMPRESSION: Findings are most suggestive cardiomegaly and pulmonary edema. The diffuse opacities could represent sequela of atypical infection instead. Decreasing air in the left chest wall soft tissues consistent with previous surgery. Electronically Signed   By: Dorise Bullion III M.D   On: 01/28/2019 18:49     Assessment/Plan: Left chest wall wound = recommend to restart cefepime. Await identification on culture results.  [facility] [place] [facility] for Infectious Diseases Cell: [phone] Pager: [phone]  01/29/2019, 12:36 PM

## 2019-01-30 ENCOUNTER — Telehealth: Payer: Self-pay

## 2019-01-30 ENCOUNTER — Encounter (HOSPITAL_COMMUNITY): Payer: Self-pay | Admitting: Plastic Surgery

## 2019-01-30 DIAGNOSIS — L02213 Cutaneous abscess of chest wall: Secondary | ICD-10-CM

## 2019-01-30 DIAGNOSIS — B9689 Other specified bacterial agents as the cause of diseases classified elsewhere: Secondary | ICD-10-CM

## 2019-01-30 DIAGNOSIS — N611 Abscess of the breast and nipple: Secondary | ICD-10-CM

## 2019-01-30 LAB — CBC
HCT: 27.9 % — ABNORMAL LOW (ref 36.0–46.0)
Hemoglobin: 8.2 g/dL — ABNORMAL LOW (ref 12.0–15.0)
MCH: 29.7 pg (ref 26.0–34.0)
MCHC: 29.4 g/dL — ABNORMAL LOW (ref 30.0–36.0)
MCV: 101.1 fL — ABNORMAL HIGH (ref 80.0–100.0)
Platelets: 689 10*3/uL — ABNORMAL HIGH (ref 150–400)
RBC: 2.76 MIL/uL — ABNORMAL LOW (ref 3.87–5.11)
RDW: 14 % (ref 11.5–15.5)
WBC: 13.1 10*3/uL — ABNORMAL HIGH (ref 4.0–10.5)
nRBC: 0 % (ref 0.0–0.2)

## 2019-01-30 LAB — BASIC METABOLIC PANEL
Anion gap: 9 (ref 5–15)
BUN: 12 mg/dL (ref 8–23)
CO2: 24 mmol/L (ref 22–32)
Calcium: 8.2 mg/dL — ABNORMAL LOW (ref 8.9–10.3)
Chloride: 103 mmol/L (ref 98–111)
Creatinine, Ser: 0.93 mg/dL (ref 0.44–1.00)
GFR calc Af Amer: >60 mL/min (ref >60)
GFR calc non Af Amer: >60 mL/min (ref >60)
Glucose, Bld: 127 mg/dL — ABNORMAL HIGH (ref 70–99)
Potassium: 3.5 mmol/L (ref 3.5–5.1)
Sodium: 136 mmol/L (ref 135–145)

## 2019-01-30 LAB — PROTIME-INR
INR: 2.7 — ABNORMAL HIGH (ref 0.8–1.2)
Prothrombin Time: 28.5 seconds — ABNORMAL HIGH (ref 11.4–15.2)

## 2019-01-30 MED ORDER — SODIUM CHLORIDE 0.9 % IV SOLN
2.0000 g | INTRAVENOUS | Status: DC
  Administered 2019-01-30 – 2019-02-01 (×3): 2 g via INTRAVENOUS
  Filled 2019-01-30 (×3): qty 2

## 2019-01-30 NOTE — Care Management Important Message (Signed)
Important Message  Patient Details  Name: Kim Anthony [name]: [id number] Date of Birth: June 25, 1950   Medicare Important Message Given:  Yes     Memory Argue 01/30/2019, 3:43 PM

## 2019-01-30 NOTE — Progress Notes (Signed)
PROGRESS NOTE    Kim Anthony  IRW:[id number] DOB: [birth year] DOA: 01/18/2019 PCP: Kim Payment, MD      Brief Narrative:  Kim Anthony is a 68 y.o. F with dCHF, HTN, hx mechanical MV and hx PE on warfarin, COPD and breast cancer of left breast s/p radiation therapy 18 years ago and now a chronic right chest wall and axillary wound for the last 6 months who presented for left chest wall wound reconstruction by Plastic Surgery.  Post-operatively, she developed fever, tachycardia, hypotension, and the hospitalist service were asked to admit for further medical care.  On admission, CXR clear, SARS-CoV-2 negative.  CTA chest the next day showed hazy bilateral opacities, no PE, but ALSO air fluid levels and evidence of chest wall necrosis possible cellulitis.  She was suspected to have sepsis from chest wall cellulitis and was started on vanc, cefepime.         Assessment & Plan:  Sepsis from chronic axillary wound S/p wound excision and flap reconstruction 7/8 S/p flap re-reconstruction 7/29 S/p I&D/repeat wound excision 8/7 Intraoperative cultures from 8/7 with serratia, sensitivities show pan-sensitive WBC improving, afebrile since 8/7. -Stop cefepime -Start ceftriaxone   Hypoxia The patient developed a new O2 requirement during this hospitalization.  The patient has extensive scarring and fibrosis of the left lung and cardiac apex by CT.  She has no wheezing to suggest bronchospasm nor edema/orthopnea to suggest CHF.  She is therapeutic on warfarin, doubt PE. -Ambulatory desaturation test -Wean O2 and obtain PFTs as an outpatient  History of mechanical MV History of PE INR therapeutic -Continue warfarin  Chronic diastolic CHF Hypertension BP normal -Continue Lasix, K -Continue metoprolol -Continue simvastatin  COPD No active disease -Continue Anoro  Hypokalemia Resolved  Other medications -Continue gabapentin  Anemia of chronic disease Hgb stable       MDM and disposition: The below labs and imaging reports were reviewed and summarized above.  Medication management as above.  The patient was admitted with fever and hypotension after skin flap reconstruction.  She was treated for 1 week without improvement, underwent repeat excision on 8/7 with wound vac application and is improving now.  Ambulatory desaturation test, trend CBC.  When WBC trends further down, likely home with oral Bactrim to complete 14 days antibiotics until Aug 21     DVT prophylaxis: N/A on warfarin Code Status: FULL Family Communication: \    Consultants:   ID  Plastic Surgery  Procedures:   Flap re-reconstruction 7/29  Repeat wound excision 8/7     Antimicrobials:  Anti-infectives (From admission, onward)   Start     Dose/Rate Route Frequency Ordered Stop   01/30/19 1400  cefTRIAXone (ROCEPHIN) 2 g in sodium chloride 0.9 % 100 mL IVPB     2 g 200 mL/hr over 30 Minutes Intravenous Every 24 hours 01/30/19 1317 02/12/19 2359   01/29/19 1400  ceFEPIme (MAXIPIME) 2 g in sodium chloride 0.9 % 100 mL IVPB  Status:  Discontinued     2 g 200 mL/hr over 30 Minutes Intravenous Every 8 hours 01/29/19 1243 01/30/19 1317   01/27/19 1717  polymyxin B 500,000 Units, bacitracin 50,000 Units in sodium chloride 0.9 % 500 mL irrigation  Status:  Discontinued       As needed 01/27/19 1717 01/27/19 1802   01/25/19 1500  azithromycin (ZITHROMAX) 500 mg in sodium chloride 0.9 % 250 mL IVPB  Status:  Discontinued     500 mg 250 mL/hr over 60 Minutes Intravenous  Every 24 hours 01/25/19 1414 01/26/19 1502   01/23/19 1800  vancomycin (VANCOCIN) IVPB 1000 mg/200 mL premix  Status:  Discontinued     1,000 mg 200 mL/hr over 60 Minutes Intravenous Every 12 hours 01/23/19 0541 01/27/19 0752   01/19/19 2200  ceFEPIme (MAXIPIME) 2 g in sodium chloride 0.9 % 100 mL IVPB  Status:  Discontinued     2 g 200 mL/hr over 30 Minutes Intravenous Every 8 hours 01/19/19 2120 01/27/19 0752    01/19/19 2200  vancomycin (VANCOCIN) 1,500 mg in sodium chloride 0.9 % 500 mL IVPB  Status:  Discontinued     1,500 mg 250 mL/hr over 120 Minutes Intravenous Every 24 hours 01/19/19 2123 01/23/19 0541   01/19/19 1900  sulfamethoxazole-trimethoprim (BACTRIM) 291.04 mg in dextrose 5 % 250 mL IVPB  Status:  Discontinued     15 mg/kg/day  77.6 kg 268.2 mL/hr over 60 Minutes Intravenous Every 6 hours 01/19/19 1824 01/19/19 2054   01/18/19 2200  ceFAZolin (ANCEF) IVPB 2g/100 mL premix  Status:  Discontinued     2 g 200 mL/hr over 30 Minutes Intravenous Every 8 hours 01/18/19 1804 01/19/19 2042   01/18/19 1155  ceFAZolin (ANCEF) 2-4 GM/100ML-% IVPB    Note to Pharmacy: Kim Anthony   : cabinet override      01/18/19 1155 01/18/19 2359             Subjective: Left armpit and chest pain is much better.  No back pain.  No fever.  No sputum.  No orthopnea, DOE or leg sewlling.  Objective: Vitals:   01/29/19 2238 01/30/19 0016 01/30/19 0535 01/30/19 0736  BP: (!) 127/96 131/84 129/76 124/78  Pulse: 87 84 81 85  Resp:  17  20  Temp:  98.2 F (36.8 C) 98 F (36.7 C) 97.9 F (36.6 C)  TempSrc:  Oral Oral Oral  SpO2:  95% 97% 97%  Weight:      Height:        Intake/Output Summary (Last 24 hours) at 01/30/2019 0807 Last data filed at 01/30/2019 9242 Gross per 24 hour  Intake 1960 ml  Output 1333 ml  Net 627 ml   Filed Weights   01/18/19 1900 01/27/19 1431  Weight: 77.6 kg 77.6 kg    Examination: General appearance:  adult female, alert and in no acute distress.   HEENT: Anicteric, conjunctiva pink, lids and lashes normal. No nasal deformity, discharge, epistaxis.  Lips moist.   Skin: Warm and dry.  The left armpit wound vac is in place, with serosanquinous discharge. Mild tenderness surrounding, skin generally indurated but improved from prior. Cardiac: RRR, nl S1-S2, no murmurs appreciated.  Capillary refill is brisk.  JVP normal.  No LE edema.  Radial pulses 2+ and  symmetric. Respiratory: Normal respiratory rate and rhythm.  CTAB without rales or wheezes. Abdomen: Abdomen soft.  No TTP. No ascites, distension, hepatosplenomegaly.   MSK: No deformities or effusions. Neuro: Awake and alert.  EOMI, moves all extremities. Speech fluent.    Psych: Sensorium intact and responding to questions, attention normal. Affect normal.  Judgment and insight appear normal.    Data Reviewed: I have personally reviewed following labs and imaging studies:  CBC: Recent Labs  Lab 01/24/19 0842 01/25/19 0715 01/26/19 0207 01/27/19 0653 01/28/19 0441 01/29/19 0215 01/30/19 0636  WBC 12.9* 15.8* 15.7* 13.6* 13.2* 14.2* 13.1*  NEUTROABS 9.7* 11.1*  --   --   --   --   --   HGB  8.3* 8.5* 8.3* 8.1* 8.8* 8.4* 8.2*  HCT 26.0* 27.3* 27.1* 26.6* 27.9* 27.9* 27.9*  MCV 95.6 97.2 99.3 98.2 96.9 101.5* 101.1*  PLT 480* 614* 590* 666* 699* 726* 706*   Basic Metabolic Panel: Recent Labs  Lab 01/24/19 0842 01/25/19 0715 01/26/19 0207 01/27/19 0653 01/29/19 1210 01/30/19 0636  NA 134* 136 135 136 136 136  K 2.9* 3.6 3.2* 3.6 3.1* 3.5  CL 102 104 104 107 102 103  CO2 20* 22 19* 19* 24 24  GLUCOSE 154* 129* 98 130* 110* 127*  BUN 9 8 7* 7* 11 12  CREATININE 0.76 0.64 0.65 0.80 0.98 0.93  CALCIUM 8.5* 8.7* 8.3* 8.5* 8.8* 8.2*  MG 1.7  --   --   --   --   --   PHOS 2.4*  --   --   --   --   --    GFR: Estimated Creatinine Clearance: 58.8 mL/min (by C-G formula based on SCr of 0.93 mg/dL). Liver Function Tests: No results for input(s): AST, ALT, ALKPHOS, BILITOT, PROT, ALBUMIN in the last 168 hours. No results for input(s): LIPASE, AMYLASE in the last 168 hours. No results for input(s): AMMONIA in the last 168 hours. Coagulation Profile: Recent Labs  Lab 01/26/19 0207 01/27/19 0653 01/28/19 0441 01/29/19 0215 01/30/19 0636  INR 2.9* 3.4* 3.0* 3.0* 2.7*   Cardiac Enzymes: No results for input(s): CKTOTAL, CKMB, CKMBINDEX, TROPONINI in the last 168 hours.  BNP (last 3 results) No results for input(s): PROBNP in the last 8760 hours. HbA1C: No results for input(s): HGBA1C in the last 72 hours. CBG: No results for input(s): GLUCAP in the last 168 hours. Lipid Profile: No results for input(s): CHOL, HDL, LDLCALC, TRIG, CHOLHDL, LDLDIRECT in the last 72 hours. Thyroid Function Tests: No results for input(s): TSH, T4TOTAL, FREET4, T3FREE, THYROIDAB in the last 72 hours. Anemia Panel: No results for input(s): VITAMINB12, FOLATE, FERRITIN, TIBC, IRON, RETICCTPCT in the last 72 hours. Urine analysis:    Component Value Date/Time   COLORURINE YELLOW 01/20/2019 0611   APPEARANCEUR CLEAR 01/20/2019 0611   LABSPEC 1.033 (H) 01/20/2019 0611   PHURINE 6.0 01/20/2019 0611   GLUCOSEU NEGATIVE 01/20/2019 0611   HGBUR NEGATIVE 01/20/2019 0611   BILIRUBINUR NEGATIVE 01/20/2019 0611   KETONESUR NEGATIVE 01/20/2019 0611   PROTEINUR NEGATIVE 01/20/2019 0611   NITRITE NEGATIVE 01/20/2019 0611   LEUKOCYTESUR TRACE (A) 01/20/2019 0611   Sepsis Labs: @LABRCNTIP (procalcitonin:4,lacticacidven:4)  ) Recent Results (from the past 240 hour(s))  Culture, blood (routine x 2)     Status: None   Collection Time: 01/23/19 12:31 PM   Specimen: BLOOD  Result Value Ref Range Status   Specimen Description BLOOD BLOOD LEFT HAND  Final   Special Requests AEROBIC BOTTLE ONLY Blood Culture adequate volume  Final   Culture   Final    NO GROWTH 5 DAYS Performed at [facility], [place] [address], [place], [place] [address]    Report Status 01/28/2019 FINAL  Final  Culture, blood (routine x 2)     Status: None   Collection Time: 01/23/19 12:31 PM   Specimen: BLOOD  Result Value Ref Range Status   Specimen Description BLOOD BLOOD LEFT HAND  Final   Special Requests AEROBIC BOTTLE ONLY Blood Culture adequate volume  Final   Culture   Final    NO GROWTH 5 DAYS Performed at [facility], [place] [address], [place], [place] [address]    Report Status 01/28/2019  FINAL  Final  Expectorated sputum assessment w rflx to resp cult     Status: None   Collection Time: 01/24/19  5:51 AM   Specimen: Sputum  Result Value Ref Range Status   Specimen Description SPUTUM  Final   Special Requests NONE  Final   Sputum evaluation   Final    THIS SPECIMEN IS ACCEPTABLE FOR SPUTUM CULTURE Performed at [facility], [address], [place], [place] [address]    Report Status 01/24/2019 FINAL  Final  Culture, respiratory     Status: None   Collection Time: 01/24/19  5:51 AM   Specimen: SPU  Result Value Ref Range Status   Specimen Description SPUTUM  Final   Special Requests NONE Reflexed from [id number]  Final   Gram Stain   Final    FEW WBC PRESENT, PREDOMINANTLY PMN ABUNDANT GRAM NEGATIVE RODS ABUNDANT GRAM POSITIVE COCCI IN PAIRS IN CLUSTERS FEW GRAM POSITIVE RODS    Culture   Final    MODERATE Consistent with normal respiratory flora. Performed at [facility], [place] [address], [place], [place] [address]    Report Status 01/26/2019 FINAL  Final  Surgical pcr screen     Status: None   Collection Time: 01/27/19 11:49 AM   Specimen: Nasal Mucosa; Nasal Swab  Result Value Ref Range Status   MRSA, PCR NEGATIVE NEGATIVE Final   Staphylococcus aureus NEGATIVE NEGATIVE Final    Comment: (NOTE) The Xpert SA Assay (FDA approved for NASAL specimens in patients 16 years of age and older), is one component of a comprehensive surveillance program. It is not intended to diagnose infection nor to guide or monitor treatment. Performed at [facility], [place] [address] [place], [place] [address]   Aerobic/Anaerobic Culture (surgical/deep wound)     Status: None (Preliminary result)   Collection Time: 01/27/19  6:18 PM   Specimen: Chest; Wound  Result Value Ref Range Status   Specimen Description WOUND  Final   Special Requests LEFT CHEST WALL  Final   Gram Stain   Final    NO WBC SEEN NO ORGANISMS SEEN Performed at [facility],  [place] [address], [place], [place] [address]    Culture   Final    RARE SERRATIA MARCESCENS SUSCEPTIBILITIES TO FOLLOW NO ANAEROBES ISOLATED; CULTURE IN PROGRESS FOR 5 DAYS    Report Status PENDING  Incomplete  Acid Fast Smear (AFB)     Status: None   Collection Time: 01/27/19  6:19 PM   Specimen: Chest; Wound  Result Value Ref Range Status   AFB Specimen Processing Concentration  Final   Acid Fast Smear Negative  Final    Comment: (NOTE) Performed At: [facility] [place], [place] [id number] Rush Farmer MD AL:[id number]    Source (AFB) WOUND  Final    Comment: LEFT CHEST WALL Performed at [facility], [place] [address], [place], [place] [address]          Radiology Studies: Dg Chest 2 View  Result Date: 01/28/2019 CLINICAL DATA:  Hypoxia. EXAM: CHEST - 2 VIEW COMPARISON:  January 23, 2019 FINDINGS: An air-fluid level in the left upper chest has decreased in the interval consistent with recent surgery. Surgical clips are seen in the left axilla. Cardiomegaly. New bilateral pulmonary opacities are suggestive of pulmonary edema versus atypical infection. No pneumothorax. No other abnormalities. IMPRESSION: Findings are most suggestive cardiomegaly and pulmonary edema. The diffuse opacities could represent sequela  of atypical infection instead. Decreasing air in the left chest wall soft tissues consistent with previous surgery. Electronically Signed   By: Dorise Bullion III M.D   On: 01/28/2019 18:49        Scheduled Meds: . dextromethorphan-guaiFENesin  1 tablet Oral BID  . furosemide  40 mg Oral Daily  . gabapentin  600 mg Oral BID  . metoprolol succinate  12.5 mg Oral QHS  . potassium chloride  20 mEq Oral Daily  . senna  1 tablet Oral BID  . simvastatin  20 mg Oral Daily  . umeclidinium-vilanterol  1 puff Inhalation Daily   Continuous Infusions: . ceFEPime (MAXIPIME) IV 2 g (01/30/19 0509)     LOS: 12 days    Time spent: 25 minutes     Edwin Dada, MD [facility] 01/30/2019, 8:07 AM     Please page through [place]:  [email or web address] Password [email or web address] If 7PM-7AM, please contact night-coverage

## 2019-01-30 NOTE — Progress Notes (Signed)
Subjective: No new complaints   Antibiotics:  Anti-infectives (From admission, onward)   Start     Dose/Rate Route Frequency Ordered Stop   01/29/19 1400  ceFEPIme (MAXIPIME) 2 g in sodium chloride 0.9 % 100 mL IVPB     2 g 200 mL/hr over 30 Minutes Intravenous Every 8 hours 01/29/19 1243     01/27/19 1717  polymyxin B 500,000 Units, bacitracin 50,000 Units in sodium chloride 0.9 % 500 mL irrigation  Status:  Discontinued       As needed 01/27/19 1717 01/27/19 1802   01/25/19 1500  azithromycin (ZITHROMAX) 500 mg in sodium chloride 0.9 % 250 mL IVPB  Status:  Discontinued     500 mg 250 mL/hr over 60 Minutes Intravenous Every 24 hours 01/25/19 1414 01/26/19 1502   01/23/19 1800  vancomycin (VANCOCIN) IVPB 1000 mg/200 mL premix  Status:  Discontinued     1,000 mg 200 mL/hr over 60 Minutes Intravenous Every 12 hours 01/23/19 0541 01/27/19 0752   01/19/19 2200  ceFEPIme (MAXIPIME) 2 g in sodium chloride 0.9 % 100 mL IVPB  Status:  Discontinued     2 g 200 mL/hr over 30 Minutes Intravenous Every 8 hours 01/19/19 2120 01/27/19 0752   01/19/19 2200  vancomycin (VANCOCIN) 1,500 mg in sodium chloride 0.9 % 500 mL IVPB  Status:  Discontinued     1,500 mg 250 mL/hr over 120 Minutes Intravenous Every 24 hours 01/19/19 2123 01/23/19 0541   01/19/19 1900  sulfamethoxazole-trimethoprim (BACTRIM) 291.04 mg in dextrose 5 % 250 mL IVPB  Status:  Discontinued     15 mg/kg/day  77.6 kg 268.2 mL/hr over 60 Minutes Intravenous Every 6 hours 01/19/19 1824 01/19/19 2054   01/18/19 2200  ceFAZolin (ANCEF) IVPB 2g/100 mL premix  Status:  Discontinued     2 g 200 mL/hr over 30 Minutes Intravenous Every 8 hours 01/18/19 1804 01/19/19 2042   01/18/19 1155  ceFAZolin (ANCEF) 2-4 GM/100ML-% IVPB    Note to Pharmacy: Merryl Hacker   : cabinet override      01/18/19 1155 01/18/19 2359      Medications: Scheduled Meds:  dextromethorphan-guaiFENesin  1 tablet Oral BID   furosemide  40 mg Oral  Daily   gabapentin  600 mg Oral BID   metoprolol succinate  12.5 mg Oral QHS   potassium chloride  20 mEq Oral Daily   senna  1 tablet Oral BID   simvastatin  20 mg Oral Daily   umeclidinium-vilanterol  1 puff Inhalation Daily   Continuous Infusions:  ceFEPime (MAXIPIME) IV 2 g (01/30/19 0509)   PRN Meds:.acetaminophen, albuterol, bisacodyl, diazepam, diphenhydrAMINE **OR** [DISCONTINUED] diphenhydrAMINE, fluticasone, HYDROcodone-acetaminophen, HYDROmorphone (DILAUDID) injection, morphine injection, ondansetron **OR** ondansetron (ZOFRAN) IV, oxyCODONE-acetaminophen, polyethylene glycol, sodium chloride flush    Objective: Weight change:   Intake/Output Summary (Last 24 hours) at 01/30/2019 1313 Last data filed at 01/30/2019 0900 Gross per 24 hour  Intake 1840 ml  Output 1530 ml  Net 310 ml   Blood pressure (!) 143/85, pulse 85, temperature 98.4 F (36.9 C), temperature source Oral, resp. rate 20, height 5\' 5"  (1.651 m), weight 77.6 kg, SpO2 97 %. Temp:  [97.9 F (36.6 C)-98.6 F (37 C)] 98.4 F (36.9 C) (08/10 1237) Pulse Rate:  [81-95] 85 (08/10 1237) Resp:  [16-20] 20 (08/10 0736) BP: (111-143)/(71-96) 143/85 (08/10 1237) SpO2:  [94 %-97 %] 97 % (08/10 1237)  Physical Exam: General: Alert and awake, oriented x3, not  in any acute distress. HEENT: anicteric sclera, EOMI CVS regular rate, normal  Chest: , no wheezing, no respiratory distress Abdomen: soft non-distended,  Extremities: no edema or deformity noted bilaterally Skin wound vaccuum in place Neuro: nonfocal  CBC:    BMET Recent Labs    01/29/19 1210 01/30/19 0636  NA 136 136  K 3.1* 3.5  CL 102 103  CO2 24 24  GLUCOSE 110* 127*  BUN 11 12  CREATININE 0.98 0.93  CALCIUM 8.8* 8.2*     Liver Panel  No results for input(s): PROT, ALBUMIN, AST, ALT, ALKPHOS, BILITOT, BILIDIR, IBILI in the last 72 hours.     Sedimentation Rate No results for input(s): ESRSEDRATE in the last 72  hours. C-Reactive Protein No results for input(s): CRP in the last 72 hours.  Micro Results: Recent Results (from the past 720 hour(s))  Culture, blood (routine x 2)     Status: None   Collection Time: 01/07/19 12:26 PM   Specimen: BLOOD RIGHT HAND  Result Value Ref Range Status   Specimen Description BLOOD RIGHT HAND  Final   Special Requests   Final    AEROBIC BOTTLE ONLY Blood Culture results may not be optimal due to an inadequate volume of blood received in culture bottles   Culture   Final    NO GROWTH 5 DAYS Performed at [facility], [place] [address], [place], [place] [address]    Report Status 01/12/2019 FINAL  Final  Culture, blood (routine x 2)     Status: None   Collection Time: 01/07/19 12:39 PM   Specimen: BLOOD RIGHT WRIST  Result Value Ref Range Status   Specimen Description BLOOD RIGHT WRIST  Final   Special Requests   Final    BOTTLES DRAWN AEROBIC AND ANAEROBIC Blood Culture adequate volume   Culture   Final    NO GROWTH 5 DAYS Performed at [facility], [place] [address], [place], [place] [address]    Report Status 01/12/2019 FINAL  Final  SARS Coronavirus 2 (Performed in [place] hospital lab)     Status: None   Collection Time: 01/16/19 10:12 AM   Specimen: Nasal Swab  Result Value Ref Range Status   SARS Coronavirus 2 NEGATIVE NEGATIVE Final    Comment: (NOTE) SARS-CoV-2 target nucleic acids are NOT DETECTED. The SARS-CoV-2 RNA is generally detectable in upper and lower respiratory specimens during the acute phase of infection. Negative results do not preclude SARS-CoV-2 infection, do not rule out co-infections with other pathogens, and should not be used as the sole basis for treatment or other patient management decisions. Negative results must be combined with clinical observations, patient history, and epidemiological information. The expected result is Negative. Fact Sheet for Patients: SugarRoll.be Fact  Sheet for Healthcare Providers: https://www.woods-mathews.com/ This test is not yet approved or cleared by the [place] FDA and  has been authorized for detection and/or diagnosis of SARS-CoV-2 by FDA under an Emergency Use Authorization (EUA). This EUA will remain  in effect (meaning this test can be used) for the duration of the COVID-19 declaration under Section 56 4(b)(1) of the Act, 21 U.S.C. section 360bbb-3(b)(1), unless the authorization is terminated or revoked sooner. Performed at [facility], [place] [address] [place], [place] [address]   Culture, blood (routine x 2)     Status: None   Collection Time: 01/19/19  9:41 PM   Specimen: BLOOD  Result Value Ref Range Status   Specimen Description  BLOOD RIGHT WRIST  Final   Special Requests   Final    BOTTLES DRAWN AEROBIC ONLY Blood Culture adequate volume   Culture   Final    NO GROWTH 5 DAYS Performed at [facility], [address], [place], [place] [address]    Report Status 01/24/2019 FINAL  Final  Culture, blood (routine x 2)     Status: None   Collection Time: 01/19/19  9:41 PM   Specimen: BLOOD  Result Value Ref Range Status   Specimen Description BLOOD RIGHT HAND  Final   Special Requests   Final    BOTTLES DRAWN AEROBIC ONLY Blood Culture adequate volume   Culture   Final    NO GROWTH 5 DAYS Performed at [facility], [place] [address], [place], [place] [address]    Report Status 01/24/2019 FINAL  Final  Culture, blood (routine x 2)     Status: None   Collection Time: 01/23/19 12:31 PM   Specimen: BLOOD  Result Value Ref Range Status   Specimen Description BLOOD BLOOD LEFT HAND  Final   Special Requests AEROBIC BOTTLE ONLY Blood Culture adequate volume  Final   Culture   Final    NO GROWTH 5 DAYS Performed at [facility], [place] [address] [place], [place] [address]    Report Status 01/28/2019 FINAL  Final  Culture, blood (routine x 2)     Status: None   Collection  Time: 01/23/19 12:31 PM   Specimen: BLOOD  Result Value Ref Range Status   Specimen Description BLOOD BLOOD LEFT HAND  Final   Special Requests AEROBIC BOTTLE ONLY Blood Culture adequate volume  Final   Culture   Final    NO GROWTH 5 DAYS Performed at [facility], [place] [address] [place], [place] [address]    Report Status 01/28/2019 FINAL  Final  Expectorated sputum assessment w rflx to resp cult     Status: None   Collection Time: 01/24/19  5:51 AM   Specimen: Sputum  Result Value Ref Range Status   Specimen Description SPUTUM  Final   Special Requests NONE  Final   Sputum evaluation   Final    THIS SPECIMEN IS ACCEPTABLE FOR SPUTUM CULTURE Performed at [facility], [place] [address], [place], [place] [address]    Report Status 01/24/2019 FINAL  Final  Culture, respiratory     Status: None   Collection Time: 01/24/19  5:51 AM   Specimen: SPU  Result Value Ref Range Status   Specimen Description SPUTUM  Final   Special Requests NONE Reflexed from [id number]  Final   Gram Stain   Final    FEW WBC PRESENT, PREDOMINANTLY PMN ABUNDANT GRAM NEGATIVE RODS ABUNDANT GRAM POSITIVE COCCI IN PAIRS IN CLUSTERS FEW GRAM POSITIVE RODS    Culture   Final    MODERATE Consistent with normal respiratory flora. Performed at [facility], [place] [address], [place], [place] [address]    Report Status 01/26/2019 FINAL  Final  Surgical pcr screen     Status: None   Collection Time: 01/27/19 11:49 AM   Specimen: Nasal Mucosa; Nasal Swab  Result Value Ref Range Status   MRSA, PCR NEGATIVE NEGATIVE Final   Staphylococcus aureus NEGATIVE NEGATIVE Final    Comment: (NOTE) The Xpert SA Assay (FDA approved for NASAL specimens in patients 46 years of age and older), is one component of a comprehensive surveillance program. It is  not intended to diagnose infection nor to guide or monitor treatment. Performed at [facility], [place] [address], [place], [place] [address]    Aerobic/Anaerobic Culture (surgical/deep wound)     Status: None (Preliminary result)   Collection Time: 01/27/19  6:18 PM   Specimen: Chest; Wound  Result Value Ref Range Status   Specimen Description WOUND  Final   Special Requests LEFT CHEST WALL  Final   Gram Stain   Final    NO WBC SEEN NO ORGANISMS SEEN Performed at [facility], [place] [address], [place], [place] [address]    Culture   Final    RARE SERRATIA MARCESCENS NO ANAEROBES ISOLATED; CULTURE IN PROGRESS FOR 5 DAYS    Report Status PENDING  Incomplete   Organism ID, Bacteria SERRATIA MARCESCENS  Final      Susceptibility   Serratia marcescens - MIC*    CEFAZOLIN >=64 RESISTANT Resistant     CEFEPIME <=1 SENSITIVE Sensitive     CEFTAZIDIME <=1 SENSITIVE Sensitive     CEFTRIAXONE <=1 SENSITIVE Sensitive     CIPROFLOXACIN <=0.25 SENSITIVE Sensitive     GENTAMICIN <=1 SENSITIVE Sensitive     TRIMETH/SULFA <=20 SENSITIVE Sensitive     * RARE SERRATIA MARCESCENS  Acid Fast Smear (AFB)     Status: None   Collection Time: 01/27/19  6:19 PM   Specimen: Chest; Wound  Result Value Ref Range Status   AFB Specimen Processing Concentration  Final   Acid Fast Smear Negative  Final    Comment: (NOTE) Performed At: [facility] [address] [place], [place] [id number] Rush Farmer MD DT:[id number]    Source (AFB) WOUND  Final    Comment: LEFT CHEST WALL Performed at [facility], [place] [address], [place], [place] [address]     Studies/Results: Dg Chest 2 View  Result Date: 01/28/2019 CLINICAL DATA:  Hypoxia. EXAM: CHEST - 2 VIEW COMPARISON:  January 23, 2019 FINDINGS: An air-fluid level in the left upper chest has decreased in the interval consistent with recent surgery. Surgical clips are seen in the left axilla. Cardiomegaly. New bilateral pulmonary opacities are suggestive of pulmonary edema versus atypical infection. No pneumothorax. No other abnormalities. IMPRESSION: Findings are most suggestive  cardiomegaly and pulmonary edema. The diffuse opacities could represent sequela of atypical infection instead. Decreasing air in the left chest wall soft tissues consistent with previous surgery. Electronically Signed   By: Dorise Bullion III M.D   On: 01/28/2019 18:49      Assessment/Plan:  INTERVAL HISTORY: Ct as above and  Had conversation afterwards this am with Dr. Marla Roe   Principal Problem:   Fever Active Problems:   Open wound of chest wall, left, initial encounter   Sepsis ([place])   Tachycardia    Kim Anthony is a 68 y.o. female with remote history of breast cancer with mastectomy and axillary lymph node dissection, XRT. scarring of tissue and chronic wound  with there was recent concern for recurrence of breast cancer status post surgery complicated by abscess which were drained, difficulty with nonhealing wound status post muscle flap  Placement on July 29th.  Then admitted on the 30th with fever and tachycardia and placed on broad-spectrum antibiotics.  She was feeding 3 antibiotics and I was concerned about deep infection therefore obtained a CT scan of her chest was read as showing abscess though Dr. Marla Roe read this and felt was actually showing the muscle flap not an abscess.  Dr.  Dillingham took pt back to OR and performed I and D of skin with Excision of left breast wound 7 x 12 cm skin, fat and 2 mg muscle, placement of Acell sheet 10 x 15 and 2 gm powder, VAC placement. Cultures grew Serratia which is fairly sensitive  Pt has subsequently improved and been started on cefepime  --I would give her 2 weeks of effective therapy post surgery which can consist of ceftriaxone while an inpatient but be converted to Bactrim DS when approaching DC  I will sign off for now please call with further questions.       LOS: 12 days   Alcide Evener 01/30/2019, 1:13 PM

## 2019-01-30 NOTE — Progress Notes (Signed)
3 Days Post-Op  Subjective:  Kim Anthony is 3 days post op from application of Acell to her left chest wall, irrigation and debridement and wound vac placement on 01/27/19.  She is 12 days post op from left myocutaneous latissimus flap.  She reports she is feeling well. Up and walking to bathroom this afternoon on evaluation. She has been using her incentive spirometer more frequently. Reports her cough has improved, but has been using supplemental oxygen intermittently.  She seems more alert this afternoon. + passing flatus and BMs. Reports pain has improved, but is still there.  Swelling of left arm improved. Drains still in place. ABD pad in place over wound vac.  Objective: Vital signs in last 24 hours: Temp:  [97.9 F (36.6 C)-98.5 F (36.9 C)] 98.3 F (36.8 C) (08/10 1932) Pulse Rate:  [81-95] 88 (08/10 1932) Resp:  [16-20] 20 (08/10 0736) BP: (111-175)/(66-106) 175/106 (08/10 1932) SpO2:  [94 %-98 %] 98 % (08/10 1932) Last BM Date: 01/30/19  Intake/Output from previous day: 08/09 0701 - 08/10 0700 In: 1960 [P.O.:1560; IV Piggyback:400] Out: 1333 [Urine:1182; Drains:151] Intake/Output this shift: No intake/output data recorded.    General appearance: Alert, NAD, working on crossword puzzle. Chest wall: Left chest wall wound vac in place. Swelling improved superiorly. Tightness of left chest wall skin. Pulm: Non-labored breathing, symmetric rise and fall.  MSK: L arm compression sleeve on, swelling of left shoulder improved. Pulses: 2+ and symmetric. Extremities: No edema of lower extremities noted.  2 JP drains in place. Serosanguinous fluid in bulbs.   Lab Results:  CBC    Component Value Date/Time   WBC 13.1 (H) 01/30/2019 0636   RBC 2.76 (L) 01/30/2019 0636   HGB 8.2 (L) 01/30/2019 0636   HGB 12.5 04/21/2018 1624   HCT 27.9 (L) 01/30/2019 0636   HCT 36.0 04/21/2018 1624   PLT 689 (H) 01/30/2019 0636   PLT 403 04/21/2018 1624   MCV 101.1 (H) 01/30/2019 0636    MCV 94 04/21/2018 1624   MCH 29.7 01/30/2019 0636   MCHC 29.4 (L) 01/30/2019 0636   RDW 14.0 01/30/2019 0636   RDW 11.4 (L) 04/21/2018 1624   LYMPHSABS 2.2 01/25/2019 0715   LYMPHSABS 3.0 04/21/2018 1624   MONOABS 1.6 (H) 01/25/2019 0715   EOSABS 0.3 01/25/2019 0715   EOSABS 0.2 04/21/2018 1624   BASOSABS 0.1 01/25/2019 0715   BASOSABS 0.1 04/21/2018 1624    BMET Recent Labs    01/29/19 1210 01/30/19 0636  NA 136 136  K 3.1* 3.5  CL 102 103  CO2 24 24  GLUCOSE 110* 127*  BUN 11 12  CREATININE 0.98 0.93  CALCIUM 8.8* 8.2*   PT/INR Recent Labs    01/29/19 0215 01/30/19 0636  LABPROT 30.9* 28.5*  INR 3.0* 2.7*   ABG No results for input(s): PHART, HCO3 in the last 72 hours.  Invalid input(s): PCO2, PO2  Studies/Results: No results found.  Anti-infectives: Anti-infectives (From admission, onward)   Start     Dose/Rate Route Frequency Ordered Stop   01/30/19 1400  cefTRIAXone (ROCEPHIN) 2 g in sodium chloride 0.9 % 100 mL IVPB     2 g 200 mL/hr over 30 Minutes Intravenous Every 24 hours 01/30/19 1317 02/12/19 2359   01/29/19 1400  ceFEPIme (MAXIPIME) 2 g in sodium chloride 0.9 % 100 mL IVPB  Status:  Discontinued     2 g 200 mL/hr over 30 Minutes Intravenous Every 8 hours 01/29/19 1243 01/30/19 1317   01/27/19  1717  polymyxin B 500,000 Units, bacitracin 50,000 Units in sodium chloride 0.9 % 500 mL irrigation  Status:  Discontinued       As needed 01/27/19 1717 01/27/19 1802   01/25/19 1500  azithromycin (ZITHROMAX) 500 mg in sodium chloride 0.9 % 250 mL IVPB  Status:  Discontinued     500 mg 250 mL/hr over 60 Minutes Intravenous Every 24 hours 01/25/19 1414 01/26/19 1502   01/23/19 1800  vancomycin (VANCOCIN) IVPB 1000 mg/200 mL premix  Status:  Discontinued     1,000 mg 200 mL/hr over 60 Minutes Intravenous Every 12 hours 01/23/19 0541 01/27/19 0752   01/19/19 2200  ceFEPIme (MAXIPIME) 2 g in sodium chloride 0.9 % 100 mL IVPB  Status:  Discontinued     2  g 200 mL/hr over 30 Minutes Intravenous Every 8 hours 01/19/19 2120 01/27/19 0752   01/19/19 2200  vancomycin (VANCOCIN) 1,500 mg in sodium chloride 0.9 % 500 mL IVPB  Status:  Discontinued     1,500 mg 250 mL/hr over 120 Minutes Intravenous Every 24 hours 01/19/19 2123 01/23/19 0541   01/19/19 1900  sulfamethoxazole-trimethoprim (BACTRIM) 291.04 mg in dextrose 5 % 250 mL IVPB  Status:  Discontinued     15 mg/kg/day  77.6 kg 268.2 mL/hr over 60 Minutes Intravenous Every 6 hours 01/19/19 1824 01/19/19 2054   01/18/19 2200  ceFAZolin (ANCEF) IVPB 2g/100 mL premix  Status:  Discontinued     2 g 200 mL/hr over 30 Minutes Intravenous Every 8 hours 01/18/19 1804 01/19/19 2042   01/18/19 1155  ceFAZolin (ANCEF) 2-4 GM/100ML-% IVPB    Note to Pharmacy: Merryl Hacker   : cabinet override      01/18/19 1155 01/18/19 2359      Assessment/Plan: s/p Procedure(s): APPLICATION OF A-CELL OF CHEST APPLICATION OF WOUND VAC IRRIGATION AND DEBRIDEMENT OF CHEST WALL  Patients condition is improving. Left chest wall tightness is not unexpected due to the history of radiation.  Continue using incentive spirometer.  Appreciate input and excellent care from ID and Medicine team.    LOS: 12 days    Charlies Constable, PA-C 01/30/2019

## 2019-01-30 NOTE — Progress Notes (Signed)
Physical Therapy Treatment Patient Details Name: Kim Anthony [name]: [id number] DOB: June 29, 1950 Today's Date: 01/30/2019    History of Present Illness Pt is a 68 y.o. F with significant PMH of hypertension, CHF (EF 50-55%), h/o MVR, PE, L breast cancer s/p XRT with chronic wound who presented on 01/19/2019 for latissimus myocutaneous flap to reconstruct the left chest wall and developed sepsis.01/20/19 per RN noteincision has completely opened up.Patient  left chest wall and left arm extending to neck red,warm and swollen    PT Comments    Pt progressing well towards all goals. Pt con't to have mild memory deficits but is now ambulating without AD. Pt with SOB and drop in SPO2 to 88-90% on RA, on 2LO2  Via Sims pt at 94-96% during ambulation. Dr. Loleta Books is aware. Acute PT to cont to follow.    Follow Up Recommendations  Outpatient PT;Supervision/Assistance - 24 hour     Equipment Recommendations  None recommended by PT    Recommendations for Other Services       Precautions / Restrictions Precautions Precautions: Fall Precaution Comments: 3 JP drains and a wound vac, watch SPO2 during ambulation Restrictions Weight Bearing Restrictions: No    Mobility  Bed Mobility               General bed mobility comments: pt sitting up in chair upon PT arrival  Transfers Overall transfer level: Needs assistance Equipment used: None Transfers: Sit to/from Stand Sit to Stand: Modified independent (Device/Increase time)         General transfer comment: pt with good technique  Ambulation/Gait Ambulation/Gait assistance: Supervision Gait Distance (Feet): 150 Feet Assistive device: None Gait Pattern/deviations: Step-through pattern Gait velocity: slower compared to normal Gait velocity interpretation: 1.31 - 2.62 ft/sec, indicative of limited community ambulator General Gait Details: decreased speed but alternating with increased step length compared to previous session. pt SpO1  between 88-91% on RA, when on 2LO2 via  pt SpO2 at 94-96%   Stairs             Wheelchair Mobility    Modified Rankin (Stroke Patients Only)       Balance Overall balance assessment: Needs assistance Sitting-balance support: Feet supported;No upper extremity supported Sitting balance-Leahy Scale: Good     Standing balance support: No upper extremity supported;During functional activity Standing balance-Leahy Scale: Fair Standing balance comment: pt performed pericare s/p tolieting and washing of hands without difficulty or need for UE support, no LOB                            Cognition Arousal/Alertness: Awake/alert Behavior During Therapy: WFL for tasks assessed/performed Overall Cognitive Status: Impaired/Different from baseline Area of Impairment: Memory                   Current Attention Level: Sustained Memory: Decreased short-term memory       Problem Solving: Requires verbal cues General Comments: pt mildly confused with what day it is and progression of care      Exercises      General Comments General comments (skin integrity, edema, etc.): SpO2 88-90% on RA during walking      Pertinent Vitals/Pain Pain Assessment: 0-10 Pain Score: 2  Pain Location: surgical/wound vac site Pain Descriptors / Indicators: Operative site guarding Pain Intervention(s): Monitored during session    Home Living  Prior Function            PT Goals (current goals can now be found in the care plan section) Acute Rehab PT Goals Patient Stated Goal: go home ASAP Progress towards PT goals: Progressing toward goals    Frequency    Min 3X/week      PT Plan Current plan remains appropriate    Co-evaluation              AM-PAC PT "6 Clicks" Mobility   Outcome Measure  Help needed turning from your back to your side while in a flat bed without using bedrails?: None Help needed moving from lying on  your back to sitting on the side of a flat bed without using bedrails?: None Help needed moving to and from a bed to a chair (including a wheelchair)?: None Help needed standing up from a chair using your arms (e.g., wheelchair or bedside chair)?: None Help needed to walk in hospital room?: None Help needed climbing 3-5 steps with a railing? : A Little 6 Click Score: 23    End of Session Equipment Utilized During Treatment: Gait belt Activity Tolerance: Patient tolerated treatment well Patient left: in chair;with call bell/phone within reach;with nursing/sitter in room Nurse Communication: Mobility status PT Visit Diagnosis: Unsteadiness on feet (R26.81);Pain;Difficulty in walking, not elsewhere classified (R26.2)     Time: 0459-9774 PT Time Calculation (min) (ACUTE ONLY): 26 min  Charges:  $Gait Training: 8-22 mins $Therapeutic Activity: 8-22 mins                     Kittie Plater, PT, DPT Acute Rehabilitation Services Pager #: [phone] Office #: [phone]    Berline Lopes 01/30/2019, 10:40 AM

## 2019-01-30 NOTE — Progress Notes (Signed)
Physical medicine rehab consult requested chart reviewed.  As latest therapy note dictated 01/25/2019 recommendations are for outpatient therapy as patient ambulating 150 feet supervision.  Would like to see patient up again with follow-up therapies.  Hold on formal rehab consult at this time

## 2019-01-30 NOTE — Progress Notes (Signed)
Inpatient Rehabilitation Admissions Coordinator  Inpatient rehab consult received., Pt not in need of an inpt rehab admission for therapy for she is supervision 150 feet without AD. Can not admit to CIR for primary wound care. We will signoff at this time  Danne Baxter, RN, MSN Rehab Admissions Coordinator [phone] 01/30/2019 11:37 AM

## 2019-01-30 NOTE — Telephone Encounter (Signed)
Spoke with [name] this afternoon in regards to her mother, Kim Anthony.

## 2019-01-30 NOTE — Telephone Encounter (Signed)
Received call from [place], patient's daughter. [name] is asking for a follow up on her mother as well as any kind of update from either [place], [place] or Dr. Marla Roe.

## 2019-01-31 DIAGNOSIS — L0390 Cellulitis, unspecified: Secondary | ICD-10-CM

## 2019-01-31 LAB — COMPREHENSIVE METABOLIC PANEL
ALT: 11 U/L (ref 0–44)
AST: 20 U/L (ref 15–41)
Albumin: 2.3 g/dL — ABNORMAL LOW (ref 3.5–5.0)
Alkaline Phosphatase: 71 U/L (ref 38–126)
Anion gap: 12 (ref 5–15)
BUN: 11 mg/dL (ref 8–23)
CO2: 20 mmol/L — ABNORMAL LOW (ref 22–32)
Calcium: 8.4 mg/dL — ABNORMAL LOW (ref 8.9–10.3)
Chloride: 105 mmol/L (ref 98–111)
Creatinine, Ser: 0.85 mg/dL (ref 0.44–1.00)
GFR calc Af Amer: >60 mL/min (ref >60)
GFR calc non Af Amer: >60 mL/min (ref >60)
Glucose, Bld: 109 mg/dL — ABNORMAL HIGH (ref 70–99)
Potassium: 3.8 mmol/L (ref 3.5–5.1)
Sodium: 137 mmol/L (ref 135–145)
Total Bilirubin: 0.2 mg/dL — ABNORMAL LOW (ref 0.3–1.2)
Total Protein: 6.6 g/dL (ref 6.5–8.1)

## 2019-01-31 LAB — CBC
HCT: 30.1 % — ABNORMAL LOW (ref 36.0–46.0)
Hemoglobin: 9 g/dL — ABNORMAL LOW (ref 12.0–15.0)
MCH: 30.3 pg (ref 26.0–34.0)
MCHC: 29.9 g/dL — ABNORMAL LOW (ref 30.0–36.0)
MCV: 101.3 fL — ABNORMAL HIGH (ref 80.0–100.0)
Platelets: 742 10*3/uL — ABNORMAL HIGH (ref 150–400)
RBC: 2.97 MIL/uL — ABNORMAL LOW (ref 3.87–5.11)
RDW: 14.1 % (ref 11.5–15.5)
WBC: 12.4 10*3/uL — ABNORMAL HIGH (ref 4.0–10.5)
nRBC: 0 % (ref 0.0–0.2)

## 2019-01-31 LAB — PROTIME-INR
INR: 2.4 — ABNORMAL HIGH (ref 0.8–1.2)
Prothrombin Time: 25.4 seconds — ABNORMAL HIGH (ref 11.4–15.2)

## 2019-01-31 MED ORDER — ENOXAPARIN SODIUM 80 MG/0.8ML ~~LOC~~ SOLN
1.0000 mg/kg | Freq: Two times a day (BID) | SUBCUTANEOUS | Status: DC
  Administered 2019-01-31 – 2019-02-01 (×3): 80 mg via SUBCUTANEOUS
  Filled 2019-01-31 (×6): qty 0.8

## 2019-01-31 NOTE — TOC Progression Note (Signed)
Transition of Care [facility]) - Progression Note    Patient Details  Name: Kim Anthony [name]: [id number] Date of Birth: August 08, 1950  Transition of Care [facility]) CM/SW Contact  Ella Bodo, RN Phone Number: 01/31/2019, 2:59 PM  Clinical Narrative:   Pt is a 68 y.o. F with significant PMH of hypertension, CHF (EF 50-55%), h/o MVR, PE, L breast cancer s/p XRT with chronic wound who presented on 01/19/2019 for latissimus myocutaneous flap to reconstruct the left chest wall and developed sepsis.  PTA, pt independent, lives at home with daughter, who is able to provide care at discharge.   Confirmed with plastics PA that pt will need home VAC at discharge.  Have initiated [facility] insurance auth forms and placed on front of pt's chart for provider signature.  Will fax to [facility] once obtained, and should be able to get VAC delivered on 02/01/19.   Will need order for wound VAC, as well as order for [facility] with frequency of wound VAC dressing changes.  Would add HHPT , as pt receiving prior to admission.  Pt active with [facility] prior to admission; HHRN able to draw PT/INR at home during Lovenox-Coumadin bridge, if needed.       Expected Discharge Plan: [place] Barriers to Discharge: Continued Medical Work up  Expected Discharge Plan and Services Expected Discharge Plan: [place]   Discharge Planning Services: CM Consult Post Acute Care Choice: [place] arrangements for the past 2 months: Single Family Home                 DME Arranged: Vac DME Agency: [employer] Date DME Agency Contacted: 01/31/19     HH Arranged: RN, PT [place] Agency: [facility] Date [place]: 01/31/19 Time [place]: [place] Representative spoke with at [place]: Pollie Meyer    Readmission Risk Interventions No flowsheet data found.  Reinaldo Raddle, RN, BSN  Trauma/Neuro ICU Case Manager [phone]

## 2019-01-31 NOTE — Progress Notes (Signed)
2200 RN changed midline dressing to right upper arm. Skin surrounding line noted to have numerous open and closed pustules. IV team consult placed for further recs for skin care. Pt c/o itching under dressing.   2300 IV team here to assess line. Dressing changed. Possibly that patient is allergic to mastisol liquid adhesive or secure port liquid adhesive the IV team uses.

## 2019-01-31 NOTE — Progress Notes (Signed)
Physical Therapy Treatment Patient Details Name: Kim Anthony [name]: [id number] DOB: [birth year] Today's Date: 01/31/2019    History of Present Illness Pt is a 68 y.o. F with significant PMH of hypertension, CHF (EF 50-55%), h/o MVR, PE, L breast cancer s/p XRT with chronic wound who presented on 01/19/2019 for latissimus myocutaneous flap to reconstruct the left chest wall and developed sepsis.01/20/19 per RN noteincision has completely opened up.Patient  left chest wall and left arm extending to neck red,warm and swollen    PT Comments    Pt progressing well towards all goals. Pt with decreased awareness of lines and demo's memory deficits but suspect this to be baseline. Pt able to amb 600' today with supervision. Acute PT to cont to follow.    Follow Up Recommendations  Outpatient PT;Supervision/Assistance - 24 hour     Equipment Recommendations  None recommended by PT    Recommendations for Other Services       Precautions / Restrictions Precautions Precautions: Fall Precaution Comments: wound vac Restrictions Weight Bearing Restrictions: No    Mobility  Bed Mobility Overal bed mobility: Modified Independent             General bed mobility comments: pt up in chair upon PT arrival  Transfers Overall transfer level: Modified independent               General transfer comment: verbal cues for wound vac line management  Ambulation/Gait Ambulation/Gait assistance: Supervision Gait Distance (Feet): 600 Feet Assistive device: None Gait Pattern/deviations: Step-through pattern;Trunk flexed Gait velocity: wfl Gait velocity interpretation: >2.62 ft/sec, indicative of community ambulatory General Gait Details: pt with SOB, pt stated "this is farther than i walk at home"  no episodes of LOB   Stairs             Wheelchair Mobility    Modified Rankin (Stroke Patients Only)       Balance Overall balance assessment: Mild deficits observed, not formally  tested Sitting-balance support: Feet supported Sitting balance-Leahy Scale: Good     Standing balance support: No upper extremity supported Standing balance-Leahy Scale: Fair                              Cognition Arousal/Alertness: Awake/alert Behavior During Therapy: WFL for tasks assessed/performed Overall Cognitive Status: No family/caregiver present to determine baseline cognitive functioning                                 General Comments: pt with noted memory deficits but suspect this to be baseline      Exercises      General Comments General comments (skin integrity, edema, etc.): VSS      Pertinent Vitals/Pain Pain Assessment: No/denies pain    Home Living Family/patient expects to be discharged to:: Private residence Living Arrangements: Children Available Help at Discharge: Family;Available 24 hours/day Type of Home: House Home Access: Stairs to enter   Home Layout: One level Home Equipment: None Additional Comments: daughter working from home and can provide necessary assist     Prior Function Level of Independence: Independent          PT Goals (current goals can now be found in the care plan section) Acute Rehab PT Goals Patient Stated Goal: go home today Progress towards PT goals: Progressing toward goals    Frequency    Min 3X/week  PT Plan Current plan remains appropriate    Co-evaluation              AM-PAC PT "6 Clicks" Mobility   Outcome Measure  Help needed turning from your back to your side while in a flat bed without using bedrails?: None Help needed moving from lying on your back to sitting on the side of a flat bed without using bedrails?: None Help needed moving to and from a bed to a chair (including a wheelchair)?: None Help needed standing up from a chair using your arms (e.g., wheelchair or bedside chair)?: None Help needed to walk in hospital room?: None Help needed climbing 3-5  steps with a railing? : A Little 6 Click Score: 23    End of Session Equipment Utilized During Treatment: Gait belt Activity Tolerance: Patient tolerated treatment well Patient left: in chair;with call bell/phone within reach;with nursing/sitter in room Nurse Communication: Mobility status PT Visit Diagnosis: Unsteadiness on feet (R26.81);Pain;Difficulty in walking, not elsewhere classified (R26.2) Pain - Right/Left: Left     Time: 2800-3491 PT Time Calculation (min) (ACUTE ONLY): 17 min  Charges:  $Gait Training: 8-22 mins                     Kittie Plater, PT, DPT Acute Rehabilitation Services Pager #: [phone] Office #: [phone]    Berline Lopes 01/31/2019, 2:38 PM

## 2019-01-31 NOTE — Progress Notes (Signed)
PROGRESS NOTE    Kim Anthony  KVQ:[id number] DOB: 1950-12-26 DOA: 01/18/2019 PCP: Dellia Cloud, MD      Brief Narrative:  Kim Anthony is a 68 y.o. F with dCHF, HTN, hx mechanical MV and hx PE on warfarin, COPD and breast cancer of left breast s/p left breast mastectomy and complete axillary dissection radiation therapy 18 years ago, nonhealing wound 6 months back, developed an abscess underwent drainage of the wound in the left axilla, followed by multiple antibiotic regimens without improvement, myocutaneous flap to reconstruct the chest wall by plastic surgery on 01/19/2019.  Post surgery patient developed fever of 103, tachycardia, hypoxia, patient was started on vancomycin, cefepime.  CT of the chest did not show any PE, reported to have extensive subcutaneous edema and inflammatory change involving the left chest wall anteriorly and laterally, extending to the presternal soft tissues, consistent with soft tissue infection as well as numerous gas and fluid collections within the left anterior chest wall soft tissues concerning for soft tissue abscess or necrosis.  These were consulted, holding the antibiotics recommended to obtain cultures.  Blood cultures from 7/30, 8/3 came back to be negative.  On 01/27/2019 patient underwent excision of left breast wound of 7X 12 cm skin, fat under 2 mg muscle, placement of ACell sheet 10 X 15 and 2 g powder, wound VAC placement.  ID recommended to continue with the Rocephin while in the hospital, discharge the patient on Bactrim for total of 2 weeks.  ##SIRS secondary to left breast wound infection post skin graft -Underwent CT scan of the chest-showed worsening the necrosis and increasing size of fluid pockets -ID was consulted, obtained deep cultures, came back to be negative -Underwent wound ACell sheet, wound VAC placement -ID recommended to continue with Rocephin while in the hospital, discharge patient with the Bactrim. -We will discuss with plastic  surgery if patient needs to go home with wound VAC. -WBC trending down -Patient is able to ambulate to the bathroom by herself  ##Mechanical mitral valve -On Coumadin with a goal of INR 2.5-3.5  ##Pulmonary embolism -Continue with the Coumadin  ##COPD -Continue the breathing treatments as needed  ##Breast cancer -S/p mastectomy/radiation/hormone treatment -Patient does not follow anymore with oncology -Recent CT scan suggested reactive lymphadenopathy  ##Chronic left shoulder pain -Could be related to infection -X-ray of the left shoulder negative for any acute abnormality  ##Hypokalemia -Continue with potassium 20 mEq daily  ##Acute blood loss anemia, anemia of chronic disease -Hemoglobin stable    MDM and disposition: The below labs and imaging reports were reviewed and summarized above.  Medication management as above.  The patient was admitted with fever and hypotension after skin flap reconstruction.  She was treated for 1 week without improvement, underwent repeat excision on 8/7 with wound vac application and is improving now.  Ambulatory desaturation test, trend CBC.  When WBC trends further down, likely home with oral Bactrim to complete 14 days antibiotics until Aug 21     DVT prophylaxis: N/A on warfarin Code Status: FULL Family Communication: \    Consultants:   ID  Plastic Surgery  Procedures:   Flap re-reconstruction 7/29  Repeat wound excision 8/7     Antimicrobials:  Anti-infectives (From admission, onward)   Start     Dose/Rate Route Frequency Ordered Stop   01/30/19 1400  cefTRIAXone (ROCEPHIN) 2 g in sodium chloride 0.9 % 100 mL IVPB     2 g 200 mL/hr over 30 Minutes Intravenous Every 24 hours  01/30/19 1317 02/12/19 2359   01/29/19 1400  ceFEPIme (MAXIPIME) 2 g in sodium chloride 0.9 % 100 mL IVPB  Status:  Discontinued     2 g 200 mL/hr over 30 Minutes Intravenous Every 8 hours 01/29/19 1243 01/30/19 1317   01/27/19 1717   polymyxin B 500,000 Units, bacitracin 50,000 Units in sodium chloride 0.9 % 500 mL irrigation  Status:  Discontinued       As needed 01/27/19 1717 01/27/19 1802   01/25/19 1500  azithromycin (ZITHROMAX) 500 mg in sodium chloride 0.9 % 250 mL IVPB  Status:  Discontinued     500 mg 250 mL/hr over 60 Minutes Intravenous Every 24 hours 01/25/19 1414 01/26/19 1502   01/23/19 1800  vancomycin (VANCOCIN) IVPB 1000 mg/200 mL premix  Status:  Discontinued     1,000 mg 200 mL/hr over 60 Minutes Intravenous Every 12 hours 01/23/19 0541 01/27/19 0752   01/19/19 2200  ceFEPIme (MAXIPIME) 2 g in sodium chloride 0.9 % 100 mL IVPB  Status:  Discontinued     2 g 200 mL/hr over 30 Minutes Intravenous Every 8 hours 01/19/19 2120 01/27/19 0752   01/19/19 2200  vancomycin (VANCOCIN) 1,500 mg in sodium chloride 0.9 % 500 mL IVPB  Status:  Discontinued     1,500 mg 250 mL/hr over 120 Minutes Intravenous Every 24 hours 01/19/19 2123 01/23/19 0541   01/19/19 1900  sulfamethoxazole-trimethoprim (BACTRIM) 291.04 mg in dextrose 5 % 250 mL IVPB  Status:  Discontinued     15 mg/kg/day  77.6 kg 268.2 mL/hr over 60 Minutes Intravenous Every 6 hours 01/19/19 1824 01/19/19 2054   01/18/19 2200  ceFAZolin (ANCEF) IVPB 2g/100 mL premix  Status:  Discontinued     2 g 200 mL/hr over 30 Minutes Intravenous Every 8 hours 01/18/19 1804 01/19/19 2042   01/18/19 1155  ceFAZolin (ANCEF) 2-4 GM/100ML-% IVPB    Note to Pharmacy: Dia Crawford   : cabinet override      01/18/19 1155 01/18/19 2359            Subjective: Continues to have improvement with the pain.  Patient is ambulating to the bathroom by herself.  Objective: Vitals:   01/31/19 0700 01/31/19 0802 01/31/19 0814 01/31/19 1319  BP:   122/72 130/75  Pulse: 86 86 82 87  Resp:  16  20  Temp:   98.2 F (36.8 C) 98.7 F (37.1 C)  TempSrc:   Oral   SpO2: (!) 86% 96% 95% 96%  Weight:      Height:        Intake/Output Summary (Last 24 hours) at 01/31/2019  1417 Last data filed at 01/31/2019 0700 Gross per 24 hour  Intake 922 ml  Output 319 ml  Net 603 ml   Filed Weights   01/18/19 1900 01/27/19 1431  Weight: 77.6 kg 77.6 kg    Examination: General appearance:  adult female, alert and in no acute distress.   HEENT: Anicteric, conjunctiva pink, lids and lashes normal. No nasal deformity, discharge, epistaxis.  Lips moist.   Skin: Warm and dry.  The left armpit wound vac is in place, with serosanquinous discharge. Mild tenderness surrounding, skin generally indurated but improved from prior. Cardiac: RRR, nl S1-S2, no murmurs appreciated.  Capillary refill is brisk.  JVP normal.  No LE edema.  Radial pulses 2+ and symmetric. Respiratory: Normal respiratory rate and rhythm.  CTAB without rales or wheezes. Abdomen: Abdomen soft.  No TTP. No ascites, distension, hepatosplenomegaly.  MSK: No deformities or effusions. Neuro: Awake and alert.  EOMI, moves all extremities. Speech fluent.    Psych: Sensorium intact and responding to questions, attention normal. Affect normal.  Judgment and insight appear normal.    Data Reviewed: I have personally reviewed following labs and imaging studies:  CBC: Recent Labs  Lab 01/25/19 0715  01/27/19 0653 01/28/19 0441 01/29/19 0215 01/30/19 0636 01/31/19 0222  WBC 15.8*   < > 13.6* 13.2* 14.2* 13.1* 12.4*  NEUTROABS 11.1*  --   --   --   --   --   --   HGB 8.5*   < > 8.1* 8.8* 8.4* 8.2* 9.0*  HCT 27.3*   < > 26.6* 27.9* 27.9* 27.9* 30.1*  MCV 97.2   < > 98.2 96.9 101.5* 101.1* 101.3*  PLT 614*   < > 666* 699* 726* 689* 742*   < > = values in this interval not displayed.   Basic Metabolic Panel: Recent Labs  Lab 01/26/19 0207 01/27/19 0653 01/29/19 1210 01/30/19 0636 01/31/19 0222  NA 135 136 136 136 137  K 3.2* 3.6 3.1* 3.5 3.8  CL 104 107 102 103 105  CO2 19* 19* 24 24 20*  GLUCOSE 98 130* 110* 127* 109*  BUN 7* 7* 11 12 11   CREATININE 0.65 0.80 0.98 0.93 0.85  CALCIUM 8.3* 8.5*  8.8* 8.2* 8.4*   GFR: Estimated Creatinine Clearance: 64.3 mL/min (by C-G formula based on SCr of 0.85 mg/dL). Liver Function Tests: Recent Labs  Lab 01/31/19 0222  AST 20  ALT 11  ALKPHOS 71  BILITOT 0.2*  PROT 6.6  ALBUMIN 2.3*   No results for input(s): LIPASE, AMYLASE in the last 168 hours. No results for input(s): AMMONIA in the last 168 hours. Coagulation Profile: Recent Labs  Lab 01/27/19 0653 01/28/19 0441 01/29/19 0215 01/30/19 0636 01/31/19 0222  INR 3.4* 3.0* 3.0* 2.7* 2.4*   Cardiac Enzymes: No results for input(s): CKTOTAL, CKMB, CKMBINDEX, TROPONINI in the last 168 hours. BNP (last 3 results) No results for input(s): PROBNP in the last 8760 hours. HbA1C: No results for input(s): HGBA1C in the last 72 hours. CBG: No results for input(s): GLUCAP in the last 168 hours. Lipid Profile: No results for input(s): CHOL, HDL, LDLCALC, TRIG, CHOLHDL, LDLDIRECT in the last 72 hours. Thyroid Function Tests: No results for input(s): TSH, T4TOTAL, FREET4, T3FREE, THYROIDAB in the last 72 hours. Anemia Panel: No results for input(s): VITAMINB12, FOLATE, FERRITIN, TIBC, IRON, RETICCTPCT in the last 72 hours. Urine analysis:    Component Value Date/Time   COLORURINE YELLOW 01/20/2019 0611   APPEARANCEUR CLEAR 01/20/2019 0611   LABSPEC 1.033 (H) 01/20/2019 0611   PHURINE 6.0 01/20/2019 0611   GLUCOSEU NEGATIVE 01/20/2019 0611   HGBUR NEGATIVE 01/20/2019 0611   BILIRUBINUR NEGATIVE 01/20/2019 0611   KETONESUR NEGATIVE 01/20/2019 0611   PROTEINUR NEGATIVE 01/20/2019 0611   NITRITE NEGATIVE 01/20/2019 0611   LEUKOCYTESUR TRACE (A) 01/20/2019 0611   Sepsis Labs: @LABRCNTIP (procalcitonin:4,lacticacidven:4)  ) Recent Results (from the past 240 hour(s))  Culture, blood (routine x 2)     Status: None   Collection Time: 01/23/19 12:31 PM   Specimen: BLOOD  Result Value Ref Range Status   Specimen Description BLOOD BLOOD LEFT HAND  Final   Special Requests AEROBIC  BOTTLE ONLY Blood Culture adequate volume  Final   Culture   Final    NO GROWTH 5 DAYS Performed at [facility], [address], [place], [place] [address]  Report Status 01/28/2019 FINAL  Final  Culture, blood (routine x 2)     Status: None   Collection Time: 01/23/19 12:31 PM   Specimen: BLOOD  Result Value Ref Range Status   Specimen Description BLOOD BLOOD LEFT HAND  Final   Special Requests AEROBIC BOTTLE ONLY Blood Culture adequate volume  Final   Culture   Final    NO GROWTH 5 DAYS Performed at [facility], [address], [place], [place] [address]    Report Status 01/28/2019 FINAL  Final  Expectorated sputum assessment w rflx to resp cult     Status: None   Collection Time: 01/24/19  5:51 AM   Specimen: Sputum  Result Value Ref Range Status   Specimen Description SPUTUM  Final   Special Requests NONE  Final   Sputum evaluation   Final    THIS SPECIMEN IS ACCEPTABLE FOR SPUTUM CULTURE Performed at [facility], [address], [place], [place] [address]    Report Status 01/24/2019 FINAL  Final  Culture, respiratory     Status: None   Collection Time: 01/24/19  5:51 AM   Specimen: SPU  Result Value Ref Range Status   Specimen Description SPUTUM  Final   Special Requests NONE Reflexed from [id number]  Final   Gram Stain   Final    FEW WBC PRESENT, PREDOMINANTLY PMN ABUNDANT GRAM NEGATIVE RODS ABUNDANT GRAM POSITIVE COCCI IN PAIRS IN CLUSTERS FEW GRAM POSITIVE RODS    Culture   Final    MODERATE Consistent with normal respiratory flora. Performed at [facility], [address], [place], [place] [address]    Report Status 01/26/2019 FINAL  Final  Surgical pcr screen     Status: None   Collection Time: 01/27/19 11:49 AM   Specimen: Nasal Mucosa; Nasal Swab  Result Value Ref Range Status   MRSA, PCR NEGATIVE NEGATIVE Final   Staphylococcus aureus NEGATIVE NEGATIVE Final    Comment: (NOTE) The Xpert SA Assay (FDA approved for NASAL  specimens in patients 56 years of age and older), is one component of a comprehensive surveillance program. It is not intended to diagnose infection nor to guide or monitor treatment. Performed at [facility], [address], [place], [place] [address]   Aerobic/Anaerobic Culture (surgical/deep wound)     Status: None (Preliminary result)   Collection Time: 01/27/19  6:18 PM   Specimen: Chest; Wound  Result Value Ref Range Status   Specimen Description WOUND  Final   Special Requests LEFT CHEST WALL  Final   Gram Stain   Final    NO WBC SEEN NO ORGANISMS SEEN Performed at [facility], [address] [place], [place] [address]    Culture   Final    RARE SERRATIA MARCESCENS NO ANAEROBES ISOLATED; CULTURE IN PROGRESS FOR 5 DAYS    Report Status PENDING  Incomplete   Organism ID, Bacteria SERRATIA MARCESCENS  Final      Susceptibility   Serratia marcescens - MIC*    CEFAZOLIN >=64 RESISTANT Resistant     CEFEPIME <=1 SENSITIVE Sensitive     CEFTAZIDIME <=1 SENSITIVE Sensitive     CEFTRIAXONE <=1 SENSITIVE Sensitive     CIPROFLOXACIN <=0.25 SENSITIVE Sensitive     GENTAMICIN <=1 SENSITIVE Sensitive     TRIMETH/SULFA <=20 SENSITIVE Sensitive     * RARE SERRATIA MARCESCENS  Acid Fast Smear (AFB)     Status: None   Collection Time:  01/27/19  6:19 PM   Specimen: Chest; Wound  Result Value Ref Range Status   AFB Specimen Processing Concentration  Final   Acid Fast Smear Negative  Final    Comment: (NOTE) Performed At: [facility] [address] [place], [place] [id number] Jolene Schimke MD JS:[id number]    Source (AFB) WOUND  Final    Comment: LEFT CHEST WALL Performed at [facility], [address], [place], [place] [address]          Radiology Studies: No results found.      Scheduled Meds: . dextromethorphan-guaiFENesin  1 tablet Oral BID  . enoxaparin (LOVENOX) injection  1 mg/kg Subcutaneous Q12H  . furosemide  40 mg Oral  Daily  . gabapentin  600 mg Oral BID  . metoprolol succinate  12.5 mg Oral QHS  . potassium chloride  20 mEq Oral Daily  . senna  1 tablet Oral BID  . simvastatin  20 mg Oral Daily  . umeclidinium-vilanterol  1 puff Inhalation Daily   Continuous Infusions: . cefTRIAXone (ROCEPHIN)  IV 2 g (01/31/19 1336)     LOS: 13 days    Time spent: 25 minutes    Zanaiya Calabria, MD [facility] 01/31/2019, 2:17 PM     Please page through AMION:  [email or web address] Password [email or web address] If 7PM-7AM, please contact night-coverage

## 2019-01-31 NOTE — Progress Notes (Signed)
Waiting on [place] and Discharge orders from doctor before she can leave. Patient is getting antibiotics in the Upper Right Arm Midline. Tolerating well.  Wound Vac is almost full.  Will need a new one soon.

## 2019-01-31 NOTE — Progress Notes (Signed)
4 Days Post-Op  Subjective: Kim Anthony is 4 days post-op from excision of left breast wound with placement of Acell sheet, powder and wound vac placement with Dr. Marla Roe.  13 days post op from left myocutaneous latissimus flap.  No acute overnight events, she is feeling well overall. No fevers. Pain has improved. She has + flatus and is having BM.  Drains in place. Compression sleeve on L arm.  Wound vac in place, 350 cc of serosanguinous fluid in cartridge.  Objective: Vital signs in last 24 hours: Temp:  [97.7 F (36.5 C)-98.7 F (37.1 C)] 97.7 F (36.5 C) (08/11 1507) Pulse Rate:  [76-88] 85 (08/11 1507) Resp:  [16-22] 22 (08/11 1507) BP: (118-175)/(61-106) 145/82 (08/11 1507) SpO2:  [86 %-98 %] 96 % (08/11 1507) Last BM Date: 01/30/19  Intake/Output from previous day: 08/10 0701 - 08/11 0700 In: 1522 [P.O.:1422; IV Piggyback:100] Out: 894 [Urine:800; Drains:94] Intake/Output this shift: Total I/O In: 480 [P.O.:480] Out: -   General appearance: alert, cooperative and no distress Head: Normocephalic, without obvious abnormality, atraumatic Back: left back dressing in place. Resp: normal rise and fall, unlabored breathing. Chest wall: no tenderness, left chest wall tenderness, improved. Wound vac in place. Erythema has improved.  Incision/Wound: 2 JP drain in place. 1 anterior chest, 1 left back. Serosanguinous/sanguinous fluid in bulbs.   Lab Results:  CBC    Component Value Date/Time   WBC 12.4 (H) 01/31/2019 0222   RBC 2.97 (L) 01/31/2019 0222   HGB 9.0 (L) 01/31/2019 0222   HGB 12.5 04/21/2018 1624   HCT 30.1 (L) 01/31/2019 0222   HCT 36.0 04/21/2018 1624   PLT 742 (H) 01/31/2019 0222   PLT 403 04/21/2018 1624   MCV 101.3 (H) 01/31/2019 0222   MCV 94 04/21/2018 1624   MCH 30.3 01/31/2019 0222   MCHC 29.9 (L) 01/31/2019 0222   RDW 14.1 01/31/2019 0222   RDW 11.4 (L) 04/21/2018 1624   LYMPHSABS 2.2 01/25/2019 0715   LYMPHSABS 3.0 04/21/2018 1624   MONOABS 1.6 (H) 01/25/2019 0715   EOSABS 0.3 01/25/2019 0715   EOSABS 0.2 04/21/2018 1624   BASOSABS 0.1 01/25/2019 0715   BASOSABS 0.1 04/21/2018 1624    BMET Recent Labs    01/30/19 0636 01/31/19 0222  NA 136 137  K 3.5 3.8  CL 103 105  CO2 24 20*  GLUCOSE 127* 109*  BUN 12 11  CREATININE 0.93 0.85  CALCIUM 8.2* 8.4*   PT/INR Recent Labs    01/30/19 0636 01/31/19 0222  LABPROT 28.5* 25.4*  INR 2.7* 2.4*   ABG No results for input(s): PHART, HCO3 in the last 72 hours.  Invalid input(s): PCO2, PO2  Studies/Results: No results found.  Anti-infectives: Anti-infectives (From admission, onward)   Start     Dose/Rate Route Frequency Ordered Stop   01/30/19 1400  cefTRIAXone (ROCEPHIN) 2 g in sodium chloride 0.9 % 100 mL IVPB     2 g 200 mL/hr over 30 Minutes Intravenous Every 24 hours 01/30/19 1317 02/12/19 2359   01/29/19 1400  ceFEPIme (MAXIPIME) 2 g in sodium chloride 0.9 % 100 mL IVPB  Status:  Discontinued     2 g 200 mL/hr over 30 Minutes Intravenous Every 8 hours 01/29/19 1243 01/30/19 1317   01/27/19 1717  polymyxin B 500,000 Units, bacitracin 50,000 Units in sodium chloride 0.9 % 500 mL irrigation  Status:  Discontinued       As needed 01/27/19 1717 01/27/19 1802   01/25/19 1500  azithromycin (  ZITHROMAX) 500 mg in sodium chloride 0.9 % 250 mL IVPB  Status:  Discontinued     500 mg 250 mL/hr over 60 Minutes Intravenous Every 24 hours 01/25/19 1414 01/26/19 1502   01/23/19 1800  vancomycin (VANCOCIN) IVPB 1000 mg/200 mL premix  Status:  Discontinued     1,000 mg 200 mL/hr over 60 Minutes Intravenous Every 12 hours 01/23/19 0541 01/27/19 0752   01/19/19 2200  ceFEPIme (MAXIPIME) 2 g in sodium chloride 0.9 % 100 mL IVPB  Status:  Discontinued     2 g 200 mL/hr over 30 Minutes Intravenous Every 8 hours 01/19/19 2120 01/27/19 0752   01/19/19 2200  vancomycin (VANCOCIN) 1,500 mg in sodium chloride 0.9 % 500 mL IVPB  Status:  Discontinued     1,500 mg 250 mL/hr  over 120 Minutes Intravenous Every 24 hours 01/19/19 2123 01/23/19 0541   01/19/19 1900  sulfamethoxazole-trimethoprim (BACTRIM) 291.04 mg in dextrose 5 % 250 mL IVPB  Status:  Discontinued     15 mg/kg/day  77.6 kg 268.2 mL/hr over 60 Minutes Intravenous Every 6 hours 01/19/19 1824 01/19/19 2054   01/18/19 2200  ceFAZolin (ANCEF) IVPB 2g/100 mL premix  Status:  Discontinued     2 g 200 mL/hr over 30 Minutes Intravenous Every 8 hours 01/18/19 1804 01/19/19 2042   01/18/19 1155  ceFAZolin (ANCEF) 2-4 GM/100ML-% IVPB    Note to Pharmacy: Merryl Hacker   : cabinet override      01/18/19 1155 01/18/19 2359      Assessment/Plan: s/p Procedure(s): APPLICATION OF A-CELL OF CHEST APPLICATION OF WOUND VAC IRRIGATION AND DEBRIDEMENT OF CHEST WALL Left latissimus myocutaneous muscle flap.  Patient is stable from plastic surgery standpoint, will follow medicine's recommendations for discharge. If patient is still in hospital on Thursday, will plan for bedside vac change.   Patient to go home with wound vac. Will continue to follow in clinic with vac changes and wound care.   Appreciate medicine and Infectious disease input and management.   LOS: 13 days    Charlies Constable, PA-C 01/31/2019

## 2019-01-31 NOTE — Progress Notes (Signed)
Occupational Therapy Treatment Patient Details Name: Kim Anthony [name]: [id number] DOB: 04-30-1951 Today's Date: 01/31/2019    History of present illness Pt is a 68 y.o. F with significant PMH of hypertension, CHF (EF 50-55%), h/o MVR, PE, L breast cancer s/p XRT with chronic wound who presented on 01/19/2019 for latissimus myocutaneous flap to reconstruct the left chest wall and developed sepsis.01/20/19 per RN noteincision has completely opened up.Patient  left chest wall and left arm extending to neck red,warm and swollen   OT comments  Patient evaluated by Occupational Therapy with no further acute OT needs identified. All education has been completed and the patient has no further questions. Pt is able to perform ADLs at mod I to supervision (for lines).  She has limited Lt shoulder ROM which she reports is long standing baseline.  She is hopeful for discharge today, and reports her daughter will be providing assist at discharge. See below for any follow-up Occupational Therapy or equipment needs. OT is signing off. Thank you for this referral.    Follow Up Recommendations  No OT follow up    Equipment Recommendations  None recommended by OT    Recommendations for Other Services      Precautions / Restrictions Precautions Precautions: Fall Precaution Comments: 3 JP drains and a wound vac, watch SPO2 during ambulation       Mobility Bed Mobility Overal bed mobility: Modified Independent                Transfers Overall transfer level: Modified independent                    Balance Overall balance assessment: Needs assistance Sitting-balance support: Feet supported Sitting balance-Leahy Scale: Good     Standing balance support: No upper extremity supported Standing balance-Leahy Scale: Fair                             ADL either performed or assessed with clinical judgement   ADL Overall ADL's : Needs assistance/impaired Eating/Feeding:  Independent   Grooming: Wash/dry hands;Wash/dry face;Oral care;Brushing hair;Supervision/safety;Standing   Upper Body Bathing: Set up;Supervision/ safety;Sitting   Lower Body Bathing: Supervison/ safety;Sit to/from stand   Upper Body Dressing : Set up;Supervision/safety;Sitting   Lower Body Dressing: Supervision/safety;Sit to/from stand   Toilet Transfer: Supervision/safety;Ambulation;Comfort height toilet   Toileting- Clothing Manipulation and Hygiene: Engineer, site Details (indicate cue type and reason): Pt will sponge bathe at home  Functional mobility during ADLs: Supervision/safety General ADL Comments: Pt requires supervision for lines      Vision       Perception     Praxis      Cognition Arousal/Alertness: Awake/alert Behavior During Therapy: WFL for tasks assessed/performed Overall Cognitive Status: No family/caregiver present to determine baseline cognitive functioning                                          Exercises     Shoulder Instructions       General Comments 02 sats 92% on RA     Pertinent Vitals/ Pain       Pain Assessment: No/denies pain  Home Living Family/patient expects to be discharged to:: Private residence Living Arrangements: Children Available Help at Discharge: Family;Available 24 hours/day Type of Home: House Home Access: Stairs to enter [employer]  of Steps: 4   Home Layout: One level     Bathroom Shower/Tub: Tub/shower unit         Home Equipment: None   Additional Comments: daughter working from home and can provide necessary assist       Prior Functioning/Environment Level of Independence: Independent            Frequency           Progress Toward Goals  OT Goals(current goals can now be found in the care plan section)     Acute Rehab OT Goals Patient Stated Goal: go home today  Plan      Co-evaluation                 AM-PAC  OT "6 Clicks" Daily Activity     Outcome Measure   Help from another person eating meals?: None Help from another person taking care of personal grooming?: None Help from another person toileting, which includes using toliet, bedpan, or urinal?: None Help from another person bathing (including washing, rinsing, drying)?: None Help from another person to put on and taking off regular upper body clothing?: None Help from another person to put on and taking off regular lower body clothing?: None 6 Click Score: 24    End of Session    OT Visit Diagnosis: Unsteadiness on feet (R26.81)   Activity Tolerance Patient tolerated treatment well   Patient Left in bed;with call bell/phone within reach(EOB )   Nurse Communication          Time: 8638-1771 OT Time Calculation (min): 8 min  Charges: OT General Charges $OT Visit: 1 Visit OT Evaluation $OT Eval Low Complexity: 1 Low  Lucille Passy, OTR/L Acute Rehabilitation Services Pager [phone] Office [phone]    Lucille Passy M 01/31/2019, 12:07 PM

## 2019-01-31 NOTE — Progress Notes (Signed)
ANTICOAGULATION CONSULT NOTE - Follow Up Consult  Pharmacy Consult for Coumadin > Lovenox Indication:  mech MV and chronic PE  Allergies  Allergen Reactions  . Acetaminophen-Codeine Itching  . Propoxyphene Itching    Darvocet  . Tramadol Itching and Nausea And Vomiting    Patient Measurements: Height: 5\' 5"  (165.1 cm) Weight: 171 lb (77.6 kg) IBW/kg (Calculated) : 57  Vital Signs: Temp: 98.3 F (36.8 C) (08/10 1932) Temp Source: Oral (08/10 1932) BP: 118/61 (08/10 2125) Pulse Rate: 76 (08/10 2125)  Labs: Recent Labs    01/29/19 0215 01/29/19 1210 01/30/19 0636 01/31/19 0222  HGB 8.4*  --  8.2* 9.0*  HCT 27.9*  --  27.9* 30.1*  PLT 726*  --  689* 742*  LABPROT 30.9*  --  28.5* 25.4*  INR 3.0*  --  2.7* 2.4*  CREATININE  --  0.98 0.93 0.85    Estimated Creatinine Clearance: 64.3 mL/min (by C-G formula based on SCr of 0.85 mg/dL).   Assessment: 21 yof on Lovenox and Warfarin for hx mechanical MVR and chronic PE. UE dopplers negative for DVT and CTA negative for acute PE this admit.  INR now 2.4 and will proceed as planned with lovenox coverage, H/H improving, plts 742  PTA Warfarin: 5 mg daily except 7.5 mg on Wed and Sun. Last dose 7/23. INR was 3 that day.   Goal of Therapy:  INR 2.5-3.5 Monitor platelets by anticoagulation protocol: Yes   Plan:  Lovenox 1mg /kg (80mg ) every 12 hours Hold warfarin per primary Monitor CBC at least q72h, s/sx bleeding F/u resuming warfarin as appropriate when all procedures complete  Bertis Ruddy, PharmD Clinical Pharmacist Please check AMION for all [facility] numbers 01/31/2019 3:34 AM

## 2019-02-01 ENCOUNTER — Telehealth: Payer: Self-pay | Admitting: Pharmacist

## 2019-02-01 ENCOUNTER — Telehealth: Payer: Self-pay | Admitting: Internal Medicine

## 2019-02-01 DIAGNOSIS — M5441 Lumbago with sciatica, right side: Secondary | ICD-10-CM

## 2019-02-01 DIAGNOSIS — R0902 Hypoxemia: Secondary | ICD-10-CM

## 2019-02-01 LAB — PROTIME-INR
INR: 1.8 — ABNORMAL HIGH (ref 0.8–1.2)
Prothrombin Time: 20.7 seconds — ABNORMAL HIGH (ref 11.4–15.2)

## 2019-02-01 LAB — AEROBIC/ANAEROBIC CULTURE W GRAM STAIN (SURGICAL/DEEP WOUND): Gram Stain: NONE SEEN

## 2019-02-01 MED ORDER — WARFARIN SODIUM 7.5 MG PO TABS: 7.5000 mg | ORAL_TABLET | Freq: Once | ORAL | Status: DC

## 2019-02-01 MED ORDER — SENNA 8.6 MG PO TABS: 1.0000 | ORAL_TABLET | Freq: Two times a day (BID) | ORAL | 0 refills | Status: DC

## 2019-02-01 MED ORDER — OXYCODONE-ACETAMINOPHEN 5-325 MG PO TABS: 1.0000 | ORAL_TABLET | Freq: Four times a day (QID) | ORAL | 0 refills | Status: DC | PRN

## 2019-02-01 MED ORDER — WARFARIN - PHARMACIST DOSING INPATIENT: Freq: Every day | Status: DC

## 2019-02-01 MED ORDER — GABAPENTIN 600 MG PO TABS: 600.0000 mg | ORAL_TABLET | Freq: Every day | ORAL | 4 refills | Status: DC

## 2019-02-01 MED ORDER — DIAZEPAM 2 MG PO TABS: 2.0000 mg | ORAL_TABLET | Freq: Two times a day (BID) | ORAL | 0 refills | Status: DC | PRN

## 2019-02-01 MED ORDER — POLYETHYLENE GLYCOL 3350 17 G PO PACK: 17.0000 g | PACK | Freq: Every day | ORAL | 0 refills | Status: DC | PRN

## 2019-02-01 MED ORDER — SULFAMETHOXAZOLE-TRIMETHOPRIM 800-160 MG PO TABS: 1.0000 | ORAL_TABLET | Freq: Two times a day (BID) | ORAL | 0 refills | Status: AC

## 2019-02-01 MED ORDER — WARFARIN SODIUM 7.5 MG PO TABS: 7.5000 mg | ORAL_TABLET | Freq: Once | ORAL | 0 refills | Status: DC

## 2019-02-01 NOTE — Progress Notes (Signed)
ANTICOAGULATION CONSULT NOTE - Follow Up Consult  Pharmacy Consult for Coumadin > Lovenox Indication:  mech MV and chronic PE  Allergies  Allergen Reactions  . Acetaminophen-Codeine Itching  . Propoxyphene Itching    Darvocet  . Tramadol Itching and Nausea And Vomiting   Patient Measurements: Height: 5\' 5"  (165.1 cm) Weight: 171 lb (77.6 kg) IBW/kg (Calculated) : 57  Vital Signs: Temp: 98.6 F (37 C) (08/12 1110) Temp Source: Oral (08/12 1110) BP: 143/93 (08/12 1110) Pulse Rate: 81 (08/12 1110)  Labs: Recent Labs    01/30/19 0636 01/31/19 0222 02/01/19 0624  HGB 8.2* 9.0*  --   HCT 27.9* 30.1*  --   PLT 689* 742*  --   LABPROT 28.5* 25.4* 20.7*  INR 2.7* 2.4* 1.8*  CREATININE 0.93 0.85  --     Estimated Creatinine Clearance: 64.3 mL/min (by C-G formula based on SCr of 0.85 mg/dL).   Assessment: 89 yof on Lovenox and Warfarin for hx mechanical MVR and chronic PE. UE dopplers negative for DVT and CTA negative for acute PE this admit.  INR now 1.8 Will resume warfarin based on MD note  PTA Warfarin: 5 mg daily except 7.5 mg on Wed and Sun. Last dose 7/23. INR was 3 that day.   Goal of Therapy:  INR 2.5-3.5 Monitor platelets by anticoagulation protocol: Yes   Plan:  Lovenox 1mg /kg (80mg ) every 12 hours Warfarin 7.5 mg x 1 Daily INR Patient likely to need to dc on enoxaparin and follow up with INR either Fri or Mon to determine when ok to dc after INR > 2.5  Levester Fresh, PharmD, BCPS, BCCCP Clinical Pharmacist [phone]  Please check AMION for all [facility] numbers  02/01/2019 12:27 PM

## 2019-02-01 NOTE — Telephone Encounter (Signed)
Called by the patient's daughter at 5:36PM today 01-Feb-2019 to my cell phone, [phone]. Patient's daughter shares with me that her mom was sent home on SMX-TMP 1 DS Tablet PO BID x 10 days. My review of the inpatient records indicate that her INR was 1.8 this morning at approximately 0624h, this down from 2.4 yesterday. Patient was discharged with instructions to take 7.5mg  warfarin TONIGHT. Review of her historical warfarin dosing requirements indicate she has required total weekly doses ranging from as low as 35mg  warfarin per week to as high as 47.5mg  warfarin per week. Given the known DDI between warfarin and SMX-TMP, I suspect even after the evening dose of 7.5mg  warfarin tonight with the commencing of SMX-TMP tonight at home we will the potential for a marked hypoprothrombinemic response early-on. As such, a requirement for an empiric dosage reduction of 30-50% of normal weekly dosage requirements of warfarin may be required. The patient performs Patient Self Testing (PST) using a finger-stick point of care device which she has in the home and has previously used and is knowledgeable in its use. The patient or the patient's daughter will be instructed to increase the frequency of PST FS POC INR determinations and communicate these results to me and I will dose warfarin in response to values I am provided. Target INR goal 2.5 - 3.5.

## 2019-02-01 NOTE — Care Management (Addendum)
Plastics MD has removed wound VAC and placed wet to dry dressing.  VAC has been approved and will be delivered to pt's home in AM, instead of hospital later this evening.  Pt is pleased with this, as she is anxious to go home.   [facility] made aware of events, and plan to see pt to place VAC once delivered to pt's home. Bedside RN updated.    Reinaldo Raddle, RN, BSN  Trauma/Neuro ICU Case Manager [phone]

## 2019-02-01 NOTE — Progress Notes (Signed)
5 Days Post-Op  Subjective: No acute overnight events. Kim Anthony sitting at bedside upon evaluation. She reports feeling well overall. Still having pain, but it is adequately controlled.   Denies fevers, chills, n/v. She has had normal BM. No other complaints.  She is scheduled for discharge today. Temperatures have been stable. WBC trending down.  Objective: Vital signs in last 24 hours: Temp:  [97.5 F (36.4 C)-98.6 F (37 C)] 98.5 F (36.9 C) (08/12 1532) Pulse Rate:  [69-93] 93 (08/12 1532) Resp:  [16-22] 16 (08/12 1532) BP: (112-146)/(69-94) 134/92 (08/12 1532) SpO2:  [92 %-100 %] 92 % (08/12 1532) Last BM Date: 01/31/19  Intake/Output from previous day: 08/11 0701 - 08/12 0700 In: 1042 [P.O.:942; IV Piggyback:100] Out: 118 [Drains:118] Intake/Output this shift: Total I/O In: 240 [P.O.:240] Out: -   General appearance: alert, cooperative, no distress and sitting at bedside Head: Normocephalic, without obvious abnormality, atraumatic Back: dressing in place along left back latissimus incision, placed 8/11. Resp: normal rise and fall, unlabored breathing Chest wall: left chest wall tenderness, imrpoved. Erythema improving. No drainage noted.  MSK: Limited ROM of L shoulder. ROM improved, able to abduct > 90 degrees. Right arm with small rash from IV dressing? GI: soft, non-tender. Extremities: extremities normal, atraumatic, no cyanosis or edema Pulses: 2+ and symmetric Incision/Wound: left chest wall wound vac removed. Sorbact left in place. Acell beginning to incorporate. Photo placed in chart. Pink wound edges, some drainage noted, appears non-infected and non-purulent.  2 JP drains in place (anterior chest and left back latissimus drain). Bulbs empty of fluid at time of evaluation, serosanguinous drainage noted in tubing of both drains.   Lab Results:  CBC    Component Value Date/Time   WBC 12.4 (H) 01/31/2019 0222   RBC 2.97 (L) 01/31/2019 0222   HGB 9.0 (L)  01/31/2019 0222   HGB 12.5 04/21/2018 1624   HCT 30.1 (L) 01/31/2019 0222   HCT 36.0 04/21/2018 1624   PLT 742 (H) 01/31/2019 0222   PLT 403 04/21/2018 1624   MCV 101.3 (H) 01/31/2019 0222   MCV 94 04/21/2018 1624   MCH 30.3 01/31/2019 0222   MCHC 29.9 (L) 01/31/2019 0222   RDW 14.1 01/31/2019 0222   RDW 11.4 (L) 04/21/2018 1624   LYMPHSABS 2.2 01/25/2019 0715   LYMPHSABS 3.0 04/21/2018 1624   MONOABS 1.6 (H) 01/25/2019 0715   EOSABS 0.3 01/25/2019 0715   EOSABS 0.2 04/21/2018 1624   BASOSABS 0.1 01/25/2019 0715   BASOSABS 0.1 04/21/2018 1624    BMET Recent Labs    01/30/19 0636 01/31/19 0222  NA 136 137  K 3.5 3.8  CL 103 105  CO2 24 20*  GLUCOSE 127* 109*  BUN 12 11  CREATININE 0.93 0.85  CALCIUM 8.2* 8.4*   PT/INR Recent Labs    01/31/19 0222 02/01/19 0624  LABPROT 25.4* 20.7*  INR 2.4* 1.8*   ABG No results for input(s): PHART, HCO3 in the last 72 hours.  Invalid input(s): PCO2, PO2  Studies/Results: No results found.  Anti-infectives: Anti-infectives (From admission, onward)   Start     Dose/Rate Route Frequency Ordered Stop   02/01/19 0000  sulfamethoxazole-trimethoprim (BACTRIM DS) 800-160 MG tablet     1 tablet Oral 2 times daily 02/01/19 1051 02/11/19 2359   01/30/19 1400  cefTRIAXone (ROCEPHIN) 2 g in sodium chloride 0.9 % 100 mL IVPB     2 g 200 mL/hr over 30 Minutes Intravenous Every 24 hours 01/30/19 1317 02/12/19 2359  01/29/19 1400  ceFEPIme (MAXIPIME) 2 g in sodium chloride 0.9 % 100 mL IVPB  Status:  Discontinued     2 g 200 mL/hr over 30 Minutes Intravenous Every 8 hours 01/29/19 1243 01/30/19 1317   01/27/19 1717  polymyxin B 500,000 Units, bacitracin 50,000 Units in sodium chloride 0.9 % 500 mL irrigation  Status:  Discontinued       As needed 01/27/19 1717 01/27/19 1802   01/25/19 1500  azithromycin (ZITHROMAX) 500 mg in sodium chloride 0.9 % 250 mL IVPB  Status:  Discontinued     500 mg 250 mL/hr over 60 Minutes Intravenous  Every 24 hours 01/25/19 1414 01/26/19 1502   01/23/19 1800  vancomycin (VANCOCIN) IVPB 1000 mg/200 mL premix  Status:  Discontinued     1,000 mg 200 mL/hr over 60 Minutes Intravenous Every 12 hours 01/23/19 0541 01/27/19 0752   01/19/19 2200  ceFEPIme (MAXIPIME) 2 g in sodium chloride 0.9 % 100 mL IVPB  Status:  Discontinued     2 g 200 mL/hr over 30 Minutes Intravenous Every 8 hours 01/19/19 2120 01/27/19 0752   01/19/19 2200  vancomycin (VANCOCIN) 1,500 mg in sodium chloride 0.9 % 500 mL IVPB  Status:  Discontinued     1,500 mg 250 mL/hr over 120 Minutes Intravenous Every 24 hours 01/19/19 2123 01/23/19 0541   01/19/19 1900  sulfamethoxazole-trimethoprim (BACTRIM) 291.04 mg in dextrose 5 % 250 mL IVPB  Status:  Discontinued     15 mg/kg/day  77.6 kg 268.2 mL/hr over 60 Minutes Intravenous Every 6 hours 01/19/19 1824 01/19/19 2054   01/18/19 2200  ceFAZolin (ANCEF) IVPB 2g/100 mL premix  Status:  Discontinued     2 g 200 mL/hr over 30 Minutes Intravenous Every 8 hours 01/18/19 1804 01/19/19 2042   01/18/19 1155  ceFAZolin (ANCEF) 2-4 GM/100ML-% IVPB    Note to Pharmacy: Merryl Hacker   : cabinet override      01/18/19 1155 01/18/19 2359      Assessment/Plan: s/p Procedure(s): Patient is post op day 5 from: APPLICATION OF A-CELL OF CHEST, APPLICATION OF WOUND VAC, IRRIGATION AND DEBRIDEMENT OF CHEST WALL  She is post op day #14 from left myocutaneous latissimus flap.  Scheduled for discharge today, stable temperatures, wbc trending down. Bactrim DS BIX x 10 days per ID and medicine. Wound vac removed today, will have home health place new vac. Wound vac orders signed, case management assisting. Appreciate their help.  KY jelly, 4x4 gauze and ABD placed over sorbact mesh dressing. Patient instructed on how to do daily dressing changes to left chest wall. She will have assistance at home from home health as well as her daughter, who works from home at this time. Change latissimus back  incision every 3 days. Will re-evaluate wound care/dressing change routine at 1 week follow up after discharge.  I spoke with daughter to inform her of the dressing change process. She understands and knows to call with any questions.   Appreciate assistance from pharmacy, medicine and ID during admission.   Follow up in clinic in ~ 1 week.  LOS: 14 days    Charlies Constable, PA-C 02/01/2019

## 2019-02-01 NOTE — Discharge Summary (Signed)
Physician Discharge Summary  Timesha Cervantez CBU:[id number] DOB: 01/08/1951 DOA: 01/18/2019  PCP: Marianna Payment, MD  Admit date: 01/18/2019 Discharge date: 02/01/2019  Time spent: 40 minutes  Recommendations for Outpatient Follow-up:  1. F/u with plastic surgery in 1 week 2. DC with home health    Discharge Diagnoses:  Principal Problem:   Fever Active Problems:   Open wound of chest wall, left, initial encounter   Sepsis ([place])   Tachycardia   Discharge Condition: stable  Diet recommendation: diabetic  Filed Weights   01/18/19 1900 01/27/19 1431  Weight: 77.6 kg 77.6 kg    History of present illness and  Hospital Course:  Mrs. Klostermann a 68 y.o.Fwith dCHF, HTN, hx mechanical MV and hx PE on warfarin, COPD and breast cancer of left breast s/p left breast mastectomy and complete axillary dissection radiation therapy 18 years ago, nonhealing wound 6 months back, developed an abscess underwent drainage of the wound in the left axilla, followed by multiple antibiotic regimens without improvement, myocutaneous flap to reconstruct the chest wall by plastic surgery on 01/19/2019.  Post surgery patient developed fever of 103, tachycardia, hypoxia, patient was started on vancomycin, cefepime.  CT of the chest did not show any PE, reported to have extensive subcutaneous edema and inflammatory change involving the left chest wall anteriorly and laterally, extending to the presternal soft tissues, consistent with soft tissue infection as well as numerous gas and fluid collections within the left anterior chest wall soft tissues concerning for soft tissue abscess or necrosis.  These were consulted, holding the antibiotics recommended to obtain cultures.  Blood cultures from 7/30, 8/3 came back to be negative.  On 01/27/2019 patient underwent excision of left breast wound of 7X 12 cm skin, fat under 2 mg muscle, placement of ACell sheet 10 X 15 and 2 g powder, wound VAC placement.  ID recommended to continue  with the Rocephin while in the hospital, discharge the patient on Bactrim for total of 2 weeks.  ##SIRS secondary to left breast wound infection post skin graft -Underwent CT scan of the chest-showed worsening the necrosis and increasing size of fluid pockets -ID was consulted, obtained deep cultures, came back to be negative -Underwent wound ACell sheet, wound VAC placement -ID recommended to continue with Rocephin while in the hospital, discharge patient with the Bactrim. -We will discuss with plastic surgery if patient needs to go home with wound VAC. -WBC trending down -Patient is able to ambulate to the bathroom by herself  ##Mechanical mitral valve -On Coumadin with a goal of INR 2.5-3.5  ##Pulmonary embolism -Continue with the Coumadin  ##COPD -Continue the breathing treatments as needed  ##Breast cancer -S/p mastectomy/radiation/hormone treatment -Patient does not follow anymore with oncology -Recent CT scan suggested reactive lymphadenopathy  ##Chronic left shoulder pain -Could be related to infection -X-ray of the left shoulder negative for any acute abnormality  ##Hypokalemia -Continue with potassium 20 mEq daily  ##Acute blood loss anemia, anemia of chronic disease -Hemoglobin stable    Consultants:   ID  Plastic Surgery  Procedures:   Flap re-reconstruction 7/29  Repeat wound excision 8/7  Discharge Exam: Vitals:   02/01/19 1135 02/01/19 1532  BP:  (!) 134/92  Pulse:  93  Resp:  16  Temp:  98.5 F (36.9 C)  SpO2: 99% 92%    General appearance:  adult female, alert and in no acute distress.   HEENT: Anicteric, conjunctiva pink, lids and lashes normal. No nasal deformity, discharge, epistaxis.  Lips moist.  Skin: Warm and dry.  The left armpit wound vac is in place, with serosanquinous discharge. Mild tenderness surrounding, skin generally indurated but improved from prior. Cardiac: RRR, nl S1-S2, no murmurs appreciated.  Capillary  refill is brisk.  JVP normal.  No LE edema.  Radial pulses 2+ and symmetric. Respiratory: Normal respiratory rate and rhythm.  CTAB without rales or wheezes. Abdomen: Abdomen soft.  No TTP. No ascites, distension, hepatosplenomegaly.   MSK: No deformities or effusions. Neuro: Awake and alert.  EOMI, moves all extremities. Speech fluent.    Psych: Sensorium intact and responding to questions, attention normal. Affect normal.  Judgment and insight appear normal.  Discharge Instructions   Discharge Instructions    Diet - low sodium heart healthy   Complete by: As directed    Increase activity slowly   Complete by: As directed      Allergies as of 02/01/2019      Reactions   Acetaminophen-codeine Itching   Propoxyphene Itching   Darvocet   Tramadol Itching, Nausea And Vomiting      Medication List    STOP taking these medications   enoxaparin 120 MG/0.8ML injection Commonly known as: LOVENOX   enoxaparin 80 MG/0.8ML injection Commonly known as: LOVENOX   furosemide 40 MG tablet Commonly known as: LASIX   HYDROcodone-acetaminophen 5-325 MG tablet Commonly known as: Norco   lisinopril 10 MG tablet Commonly known as: ZESTRIL     TAKE these medications   albuterol (2.5 MG/3ML) 0.083% nebulizer solution Commonly known as: PROVENTIL Take 3 mLs (2.5 mg total) by nebulization every 6 (six) hours as needed for wheezing or shortness of breath.   albuterol 108 (90 Base) MCG/ACT inhaler Commonly known as: VENTOLIN HFA Inhale 1-2 puffs into the lungs every 6 (six) hours as needed for wheezing or shortness of breath.   Anoro Ellipta 62.5-25 MCG/INH Aepb Generic drug: umeclidinium-vilanterol Inhale 1 puff by mouth once daily What changed: See the new instructions.   CITRACAL +D3 PO Take 1 tablet by mouth 2 (two) times a day.   diazepam 2 MG tablet Commonly known as: VALIUM Take 1 tablet (2 mg total) by mouth every 12 (twelve) hours as needed for muscle spasms. What  changed:   medication strength  how much to take   fluticasone 50 MCG/ACT nasal spray Commonly known as: Flonase Place 2 sprays into both nostrils daily. What changed:   how much to take  when to take this  reasons to take this   gabapentin 600 MG tablet Commonly known as: NEURONTIN Take 1 tablet (600 mg total) by mouth at bedtime. What changed: when to take this   metoprolol succinate 25 MG 24 hr tablet Commonly known as: TOPROL-XL TAKE 1/2 (ONE-HALF) TABLET BY MOUTH ONCE DAILY What changed:   how much to take  how to take this  when to take this  additional instructions   oxyCODONE-acetaminophen 5-325 MG tablet Commonly known as: PERCOCET/ROXICET Take 1 tablet by mouth every 6 (six) hours as needed for up to 7 days for severe pain.   polyethylene glycol 17 g packet Commonly known as: MIRALAX / GLYCOLAX Take 17 g by mouth daily as needed for mild constipation.   potassium chloride SA 20 MEQ tablet Commonly known as: K-DUR Take 2 tablets (40 mEq total) by mouth daily.   senna 8.6 MG Tabs tablet Commonly known as: SENOKOT Take 1 tablet (8.6 mg total) by mouth 2 (two) times daily.   simvastatin 20 MG tablet Commonly known as:  ZOCOR Take 1 tablet by mouth once daily   sulfamethoxazole-trimethoprim 800-160 MG tablet Commonly known as: BACTRIM DS Take 1 tablet by mouth 2 (two) times daily for 10 days.   warfarin 7.5 MG tablet Commonly known as: COUMADIN Take as directed. If you are unsure how to take this medication, talk to your nurse or doctor. Original instructions: Take 1 tablet (7.5 mg total) by mouth one time only at 6 PM. What changed:   medication strength  how much to take  when to take this      Allergies  Allergen Reactions  . Acetaminophen-Codeine Itching  . Propoxyphene Itching    Darvocet  . Tramadol Itching and Nausea And Vomiting   Follow-up Information    Dillingham, Loel Lofty, DO In 1 week.   Specialty: Plastic  Surgery Contact information: [address] [place] [address] [phone]        Marianna Payment, MD. Go on 02/16/2019.   Specialty: Internal Medicine Why: Appointment at 3:15 with Dr. Maralyn Sago information: [address] [place] [address] [phone]            The results of significant diagnostics from this hospitalization (including imaging, microbiology, ancillary and laboratory) are listed below for reference.    Significant Diagnostic Studies: Dg Chest 2 View  Result Date: 01/28/2019 CLINICAL DATA:  Hypoxia. EXAM: CHEST - 2 VIEW COMPARISON:  January 23, 2019 FINDINGS: An air-fluid level in the left upper chest has decreased in the interval consistent with recent surgery. Surgical clips are seen in the left axilla. Cardiomegaly. New bilateral pulmonary opacities are suggestive of pulmonary edema versus atypical infection. No pneumothorax. No other abnormalities. IMPRESSION: Findings are most suggestive cardiomegaly and pulmonary edema. The diffuse opacities could represent sequela of atypical infection instead. Decreasing air in the left chest wall soft tissues consistent with previous surgery. Electronically Signed   By: Dorise Bullion III M.D   On: 01/28/2019 18:49   Dg Chest 2 View  Result Date: 01/23/2019 CLINICAL DATA:  Fever, cough and chest pain. EXAM: CHEST - 2 VIEW COMPARISON:  Single-view of the chest 01/19/2019, 01/07/2019. PA and lateral chest 11/24/2017 and 08/10/2017. CT chest 01/20/2019. FINDINGS: The lungs are emphysematous. No consolidative process, pneumothorax or effusion. There is cardiomegaly. Surgical clips left axilla are noted. There are air-fluid levels projecting in the left upper chest and left axilla correlating with findings on recent CT scan. Surgical drains in the anterior and posterior left chest are noted. IMPRESSION: Postoperative change left chest with surgical drains present. Air-fluid levels in the left upper  chest and axilla correlate with fluid collections in the left chest seen on recent CT. Emphysema. Cardiomegaly. Electronically Signed   By: Inge Rise M.D.   On: 01/23/2019 11:03   Ct Chest W Contrast  Result Date: 01/26/2019 CLINICAL DATA:  Fever, recent wound infection following left mastectomy and chest wall revision EXAM: CT CHEST WITH CONTRAST TECHNIQUE: Multidetector CT imaging of the chest was performed during intravenous contrast administration. CONTRAST:  75 mL OMNIPAQUE IOHEXOL 300 MG/ML  SOLN COMPARISON:  Numerous priors, most recent CT January 20, 2019 FINDINGS: Cardiovascular: The ascending aorta is ectatic and calcified. No frank thoracic aortic aneurysm. Central pulmonary arterial enlargement likely reflecting pulmonary artery hypertension. There is stable cardiomegaly with biatrial enlargement. A mitral valve prosthesis noted. Coronary arteries are heavily calcified with additional surgical clips within the mediastinum possibly related to prior CABG. There is thickening enhancement of the pericardium at  the cardiac apex similar to prior studies and likely reflecting radiation related change. Mediastinum/Nodes: Trachea is unremarkable. No concerning thyroid abnormality. No esophageal abnormality. Redemonstration of mediastinal, hilar and axillary adenopathy. Many of these nodes are low-attenuation are likely reactive in nature. Reference nodes include a 15 mm right axillary lymph node (3/78), stable. A 2 cm precarinal lymph node is similar to prior (3/53). Lungs/Pleura: Multiple areas of radiation related change including fibrosis and pleural thickening in the anterior portion of the left lung as well as more masslike fibrosis in the left lung apex. These areas unchanged on multiple serial comparison images. There is a background of moderate to severe centrilobular emphysema. There is been interval increase in the size a left pleural effusion. Increasing ground-glass opacity in the posterior  lung bases likely reflecting atelectasis. No new consolidative opacity. Upper Abdomen: No acute abnormalities present in the visualized portions of the upper abdomen. Musculoskeletal: There are extensive post mastectomy changes on the left with interval removal of the anterior drainage catheter. A posterior chest wall drain remains in place. There has been interval enlargement of the rim enhancing subcutaneous air and fluid collection within the soft tissues of the anterior left shoulder girdle. A surgical drain is positioned inferiorly to these complex collections. A separate drain along the posterior chest wall remains in position with minimal phlegmonous change along the posterior soft tissues. There is overlying skin thickening concerning for abscess formation with cellulitis and while hypoattenuation of the adjacent musculature suspicious for reactive myositis. Sclerosis and lucency of the subjacent ribs is grossly similar to the prior study. IMPRESSION: 1. Interval enlargement of the rim enhancing subcutaneous air and fluid collection within the soft tissues of the anterior left shoulder girdle, concerning for worsening necrosis and abscess formation. There is overlying skin thickening which may reflect developing cellulitis with areas of cutaneous defect. Hypoattenuation of the adjacent musculature is suspicious for reactive myositis. The anterior surgical drain is positioned inferiorly to these complex collections. 2. Diminishing phlegmonous change seen in the posterior soft tissues. 3. Slight interval increase in the size of a left pleural effusion. 4. Extensive areas of scarring and fibrosis throughout the left lung and along the pleura as well as at the cardiac apex likely reflecting radiation related changes seen on multiple prior exams. 5. Mitral valve replacement 6. Cardiomegaly with biatrial enlargement and coronary atherosclerosis. 7. Aortic Atherosclerosis (ICD10-I70.0). 8.  Emphysema (ICD10-J43.9).  These results were called by telephone at the time of interpretation on 01/26/2019 at 10:03 pm to Dr. Posey Pronto, who verbally acknowledged these results. Electronically Signed   By: Lovena Le M.D.   On: 01/26/2019 22:04   Ct Angio Chest Pe W Or Wo Contrast  Result Date: 01/20/2019 CLINICAL DATA:  Tachycardia with fever EXAM: CT ANGIOGRAPHY CHEST WITH CONTRAST TECHNIQUE: Multidetector CT imaging of the chest was performed using the standard protocol during bolus administration of intravenous contrast. Multiplanar CT image reconstructions and MIPs were obtained to evaluate the vascular anatomy. CONTRAST:  43mL OMNIPAQUE IOHEXOL 350 MG/ML SOLN COMPARISON:  Radiograph 01/19/2019, CT chest 10/07/2018, 06/08/2018 FINDINGS: Cardiovascular: Satisfactory opacification of the pulmonary arteries to the segmental level. No evidence of pulmonary embolism. Mild aortic atherosclerosis. Mitral valve prosthesis. Post sternotomy changes. Ectatic aorta without aneurysm. Slightly enlarged pulmonary trunk. Coronary vascular calcification. Marked cardiomegaly. No pericardial effusion. Mediastinum/Nodes: Midline trachea. No thyroid mass. Right axillary lymph nodes measuring up to 13 mm. Slight increased mediastinal adenopathy. Right paratracheal lymph node measures 13 mm. Precarinal lymph node measures 2 cm compared  with 16 mm previously. Esophagus within normal limits. Lungs/Pleura: Left apical fibrosis. 6 mm right apical lung nodule, series 7, image number 21, no change. Subpleural fibrosis with calcification in the left upper lobe anteriorly, no change. Emphysematous disease. Diffuse bilateral hazy lung density. Trace pleural effusion. Upper Abdomen: No acute abnormality. Musculoskeletal: Post mastectomy changes on the left. Drainage catheter within the left lower anterior chest wall. Extensive edema and soft tissue inflammatory change throughout the left chest wall. This extends to the presternal region. Multiple gas and fluid  collections within the left chest wall soft tissues anteriorly. Underlying rib irregularity and sclerosis on the left, no change. Review of the MIP images confirms the above findings. IMPRESSION: 1. Negative for acute pulmonary embolus 2. Post mastectomy changes on the left. Extensive subcutaneous edema and inflammatory change involving the left chest wall anteriorly and laterally, extending to the presternal soft tissues, consistent with soft tissue infection. Numerous gas and fluid collections within the left anterior chest wall soft tissues, concerning for soft tissue abscess or necrosis. 3. Cardiomegaly. Underlying emphysematous disease. Diffuse hazy attenuation throughout the lung fields, could reflect mild edema 4. Increased right axillary and mediastinal adenopathy, possibly reactive 5. Left upper lobe anterior and apical fibrosis 6. No change in 6 mm right apical lung nodule Aortic Atherosclerosis (ICD10-I70.0) and Emphysema (ICD10-J43.9). Electronically Signed   By: Donavan Foil M.D.   On: 01/20/2019 00:50   Dg Chest Port 1 View  Result Date: 01/19/2019 CLINICAL DATA:  Fever EXAM: PORTABLE CHEST 1 VIEW COMPARISON:  01/07/2019, 04/11/2018, 11/25/2017 FINDINGS: Post sternotomy changes with valve prosthesis. Emphysematous disease. Cardiomegaly with vascular congestion. Mild diffuse interstitial and ground-glass opacity, felt consistent with chronic interstitial change. Clips in the left axilla. No pneumothorax. Catheters or leads over the left mid and lower chest. Opacity at the left apex, felt to correspond to fibrosis noted on CT. IMPRESSION: 1. Cardiomegaly with central vascular congestion. Emphysematous disease 2. Mild diffuse bilateral interstitial opacity, likely chronic change. No acute superimposed airspace disease. Electronically Signed   By: Donavan Foil M.D.   On: 01/19/2019 21:00   Dg Chest Port 1 View  Result Date: 01/07/2019 CLINICAL DATA:  Pain and bleeding from left axillary surgical  site, history of left breast cancer and mastectomy. EXAM: PORTABLE CHEST 1 VIEW COMPARISON:  04/11/2018, CT chest, 10/07/2018 FINDINGS: Mild, diffuse bilateral interstitial pulmonary opacity, likely edema, and underlying emphysematous change. No focal airspace opacity. Small pulmonary nodules better characterized by prior CT. Cardiomegaly status post median sternotomy with mitral valve prosthesis. IMPRESSION: Mild, diffuse bilateral interstitial pulmonary opacity, likely edema, and underlying emphysematous change. No focal airspace opacity. Small pulmonary nodules better characterized by prior CT. Cardiomegaly status post median sternotomy with mitral valve prosthesis. Electronically Signed   By: Eddie Candle M.D.   On: 01/07/2019 12:49   Vas [place] Upper Extremity Venous Duplex  Result Date: 01/22/2019 UPPER VENOUS STUDY  Indications: Edema, Swelling, pain in chest wall, and History of left breast cancer, status post surgery with chronic wound Risk Factors: Cancer breast. Comparison Study: Prior negative left upper extremity venous duplex done 01/07/19                   and is available for comparison. Performing Technologist: Sharion Dove RVS  Examination Guidelines: A complete evaluation includes B-mode imaging, spectral Doppler, color Doppler, and power Doppler as needed of all accessible portions of each vessel. Bilateral testing is considered an integral part of a complete examination. Limited examinations for reoccurring indications  may be performed as noted.  Right Findings: +----------+------------+---------+-----------+----------+-------+ RIGHT     CompressiblePhasicitySpontaneousPropertiesSummary +----------+------------+---------+-----------+----------+-------+ Subclavian               Yes       Yes                      +----------+------------+---------+-----------+----------+-------+  Left Findings: +----------+------------+---------+-----------+----------+-------+ LEFT       CompressiblePhasicitySpontaneousPropertiesSummary +----------+------------+---------+-----------+----------+-------+ IJV           Full       Yes       Yes                      +----------+------------+---------+-----------+----------+-------+ Subclavian               Yes       Yes                      +----------+------------+---------+-----------+----------+-------+ Axillary                 Yes       Yes                      +----------+------------+---------+-----------+----------+-------+ Brachial      Full       Yes       Yes                      +----------+------------+---------+-----------+----------+-------+ Radial        Full                                          +----------+------------+---------+-----------+----------+-------+ Ulnar         Full                                          +----------+------------+---------+-----------+----------+-------+ Cephalic      Full                                          +----------+------------+---------+-----------+----------+-------+ Basilic       Full                                          +----------+------------+---------+-----------+----------+-------+  Summary:  Right: No evidence of thrombosis in the subclavian.  Left: No evidence of deep vein thrombosis in the upper extremity. No evidence of superficial vein thrombosis in the upper extremity. No change since prior study done 01/07/19.  *See table(s) above for measurements and observations.  Diagnosing physician: Curt Jews MD Electronically signed by Curt Jews MD on 01/22/2019 at 8:05:53 AM.    Final    Ue Venous Duplex ([facility] Only)  Result Date: 01/08/2019 UPPER VENOUS STUDY  Indications: Pain, Swelling, and Leaking wound on chest post breast surgery Limitations: Bandages and open wound. Comparison Study: No prior study on file for comparison. Performing Technologist: Sharion Dove RVS  Examination Guidelines: A complete evaluation  includes B-mode imaging, spectral Doppler, color Doppler, and power Doppler as needed of all accessible portions of  each vessel. Bilateral testing is considered an integral part of a complete examination. Limited examinations for reoccurring indications may be performed as noted.  Right Findings: +-----+------------+---------+-----------+----------+-------+ RIGHTCompressiblePhasicitySpontaneousPropertiesSummary +-----+------------+---------+-----------+----------+-------+ IJV      Full       Yes       Yes                      +-----+------------+---------+-----------+----------+-------+  Left Findings: +----------+------------+---------+-----------+----------+--------------------+ LEFT      CompressiblePhasicitySpontaneousProperties      Summary        +----------+------------+---------+-----------+----------+--------------------+ IJV           Full       Yes       Yes                                   +----------+------------+---------+-----------+----------+--------------------+ Subclavian               Yes       Yes                                   +----------+------------+---------+-----------+----------+--------------------+ Axillary                                               Not visualized    +----------+------------+---------+-----------+----------+--------------------+ Brachial      Full                                                       +----------+------------+---------+-----------+----------+--------------------+ Radial                                                patent by color                                                              Doppler        +----------+------------+---------+-----------+----------+--------------------+ Ulnar                                                 patent by color                                                              Doppler         +----------+------------+---------+-----------+----------+--------------------+ Cephalic      Full                                                       +----------+------------+---------+-----------+----------+--------------------+  Basilic                                                Not visualized    +----------+------------+---------+-----------+----------+--------------------+  Summary:  Right: No evidence of thrombosis in the subclavian.  Left: No evidence of deep vein thrombosis in the upper extremity. However, unable to visualize the axillary, basilic. No evidence of superficial vein thrombosis in the upper extremity. However, unable to visualize the axillary, basilic. No evidence of thrombosis in the . However, unable to visualize the axillary, basilic. This was a limited study.  *See table(s) above for measurements and observations.  Diagnosing physician: Monica Martinez MD Electronically signed by Monica Martinez MD on 01/08/2019 at 11:52:33 AM.    Final     Microbiology: Recent Results (from the past 240 hour(s))  Culture, blood (routine x 2)     Status: None   Collection Time: 01/23/19 12:31 PM   Specimen: BLOOD  Result Value Ref Range Status   Specimen Description BLOOD BLOOD LEFT HAND  Final   Special Requests AEROBIC BOTTLE ONLY Blood Culture adequate volume  Final   Culture   Final    NO GROWTH 5 DAYS Performed at [facility], [address] [place], [place] [address]    Report Status 01/28/2019 FINAL  Final  Culture, blood (routine x 2)     Status: None   Collection Time: 01/23/19 12:31 PM   Specimen: BLOOD  Result Value Ref Range Status   Specimen Description BLOOD BLOOD LEFT HAND  Final   Special Requests AEROBIC BOTTLE ONLY Blood Culture adequate volume  Final   Culture   Final    NO GROWTH 5 DAYS Performed at [facility], [place] [address], [place], [place] [address]    Report Status 01/28/2019 FINAL  Final  Expectorated sputum assessment  w rflx to resp cult     Status: None   Collection Time: 01/24/19  5:51 AM   Specimen: Sputum  Result Value Ref Range Status   Specimen Description SPUTUM  Final   Special Requests NONE  Final   Sputum evaluation   Final    THIS SPECIMEN IS ACCEPTABLE FOR SPUTUM CULTURE Performed at [facility], [place] [address] [place], [place] [address]    Report Status 01/24/2019 FINAL  Final  Culture, respiratory     Status: None   Collection Time: 01/24/19  5:51 AM   Specimen: SPU  Result Value Ref Range Status   Specimen Description SPUTUM  Final   Special Requests NONE Reflexed from [id number]  Final   Gram Stain   Final    FEW WBC PRESENT, PREDOMINANTLY PMN ABUNDANT GRAM NEGATIVE RODS ABUNDANT GRAM POSITIVE COCCI IN PAIRS IN CLUSTERS FEW GRAM POSITIVE RODS    Culture   Final    MODERATE Consistent with normal respiratory flora. Performed at [facility], [place] [address] [place], [place] [address]    Report Status 01/26/2019 FINAL  Final  Surgical pcr screen     Status: None   Collection Time: 01/27/19 11:49 AM   Specimen: Nasal Mucosa; Nasal Swab  Result Value Ref Range Status   MRSA, PCR NEGATIVE NEGATIVE Final   Staphylococcus aureus NEGATIVE NEGATIVE Final    Comment: (NOTE) The Xpert SA Assay (FDA approved for NASAL specimens in patients 78 years of age and  older), is one component of a comprehensive surveillance program. It is not intended to diagnose infection nor to guide or monitor treatment. Performed at [facility], [place] [address] [place], [place] [address]   Aerobic/Anaerobic Culture (surgical/deep wound)     Status: None   Collection Time: 01/27/19  6:18 PM   Specimen: Chest; Wound  Result Value Ref Range Status   Specimen Description WOUND  Final   Special Requests LEFT CHEST WALL  Final   Gram Stain NO WBC SEEN NO ORGANISMS SEEN   Final   Culture   Final    RARE SERRATIA MARCESCENS NO ANAEROBES ISOLATED Performed at [facility],  [address], [place], [place] [address]    Report Status 02/01/2019 FINAL  Final   Organism ID, Bacteria SERRATIA MARCESCENS  Final      Susceptibility   Serratia marcescens - MIC*    CEFAZOLIN >=64 RESISTANT Resistant     CEFEPIME <=1 SENSITIVE Sensitive     CEFTAZIDIME <=1 SENSITIVE Sensitive     CEFTRIAXONE <=1 SENSITIVE Sensitive     CIPROFLOXACIN <=0.25 SENSITIVE Sensitive     GENTAMICIN <=1 SENSITIVE Sensitive     TRIMETH/SULFA <=20 SENSITIVE Sensitive     * RARE SERRATIA MARCESCENS  Acid Fast Smear (AFB)     Status: None   Collection Time: 01/27/19  6:19 PM   Specimen: Chest; Wound  Result Value Ref Range Status   AFB Specimen Processing Concentration  Final   Acid Fast Smear Negative  Final    Comment: (NOTE) Performed At: [facility] [address] [place], [place] [id number] Rush Farmer MD IR:[id number]    Source (AFB) WOUND  Final    Comment: LEFT CHEST WALL Performed at [facility], [place] [address] [place], [place] [address]      Labs: Basic Metabolic Panel: Recent Labs  Lab 01/26/19 0207 01/27/19 0653 01/29/19 1210 01/30/19 0636 01/31/19 0222  NA 135 136 136 136 137  K 3.2* 3.6 3.1* 3.5 3.8  CL 104 107 102 103 105  CO2 19* 19* 24 24 20*  GLUCOSE 98 130* 110* 127* 109*  BUN 7* 7* 11 12 11   CREATININE 0.65 0.80 0.98 0.93 0.85  CALCIUM 8.3* 8.5* 8.8* 8.2* 8.4*   Liver Function Tests: Recent Labs  Lab 01/31/19 0222  AST 20  ALT 11  ALKPHOS 71  BILITOT 0.2*  PROT 6.6  ALBUMIN 2.3*   No results for input(s): LIPASE, AMYLASE in the last 168 hours. No results for input(s): AMMONIA in the last 168 hours. CBC: Recent Labs  Lab 01/27/19 0653 01/28/19 0441 01/29/19 0215 01/30/19 0636 01/31/19 0222  WBC 13.6* 13.2* 14.2* 13.1* 12.4*  HGB 8.1* 8.8* 8.4* 8.2* 9.0*  HCT 26.6* 27.9* 27.9* 27.9* 30.1*  MCV 98.2 96.9 101.5* 101.1* 101.3*  PLT 666* 699* 726* 689* 742*   Cardiac Enzymes: No results for input(s): CKTOTAL, CKMB,  CKMBINDEX, TROPONINI in the last 168 hours. BNP: BNP (last 3 results) Recent Labs    04/11/18 0322  BNP 74.6    ProBNP (last 3 results) No results for input(s): PROBNP in the last 8760 hours.  CBG: No results for input(s): GLUCAP in the last 168 hours.     SignedMonica Becton MD.  [facility] 02/01/2019, 4:22 PM

## 2019-02-01 NOTE — Progress Notes (Signed)
There is a discharge for patient.  When the equipment gets here she can leave.

## 2019-02-01 NOTE — TOC Transition Note (Signed)
Transition of Care [facility]) - CM/SW Discharge Note   Patient Details  Name: Kim Anthony [name]: [id number] Date of Birth: 1951/01/10  Transition of Care [facility]) CM/SW Contact:  Ella Bodo, RN Phone Number: 02/01/2019, 3:17 PM   Clinical Narrative:  Pt medically stable for discharge home today with daughter and [facility] services.  [facility] to provide VAC dressing changes at dc, as well as PT/INR checks, per MD orders.  [facility] insurance auth form and clinicals have been submitted for approval; currently waiting for insurance auth and delivery of home VAC.  Pt aware that VAC may not be delivered until later this afternoon.  Will provide updates as available; [facility] aware of discharge home today.     Final next level of care: Home w Home Health Services Barriers to Discharge: No Barriers Identified   Patient Goals and CMS Choice   CMS Medicare.gov Compare Post Acute Care list provided to:: Patient Choice offered to / list presented to : Patient                         Discharge Plan and Services   Discharge Planning Services: CM Consult Post Acute Care Choice: Home Health          DME Arranged: Vac DME Agency: [facility] Date DME Agency Contacted: 01/31/19     HH Arranged: RN, PT [place] Agency: [facility] Date [place]: 01/31/19 Time [employer]: 1458 Representative spoke with at [place]: Pollie Meyer   Readmission Risk Interventions Readmission Risk Prevention Plan 02/01/2019  Transportation Screening Complete  PCP or Specialist Appt within 3-5 Days Complete  HRI or [place] Complete  Social Work Consult for [place] Planning/Counseling Patient refused  Palliative Care Screening Not Applicable  Medication Review Press [employer]) Complete  Some recent data might be hidden    Reinaldo Raddle, RN, BSN  Trauma/Neuro ICU Case Manager [phone]

## 2019-02-01 NOTE — Telephone Encounter (Signed)
TOC HFU 02/16/2019 WITH DR COE

## 2019-02-02 ENCOUNTER — Telehealth: Payer: Self-pay | Admitting: Pharmacist

## 2019-02-02 ENCOUNTER — Telehealth: Payer: Self-pay

## 2019-02-02 DIAGNOSIS — Z4789 Encounter for other orthopedic aftercare: Secondary | ICD-10-CM | POA: Diagnosis not present

## 2019-02-02 DIAGNOSIS — Z4801 Encounter for change or removal of surgical wound dressing: Secondary | ICD-10-CM | POA: Diagnosis not present

## 2019-02-02 NOTE — Telephone Encounter (Signed)
Called and spoke with Mo with [facility] regarding the message below.  She needed to know the pressure settings for the wound vac.  She said they normally use 155mm, but she wanted to make sure.  She also wanted to know about when the wound vac will be arriving.  I informed her that yes the pressure setting is 199mm.  I informed her that I was not sure about when the vac will arrive, but I can call Dawn with [facility] and try to find out.  She verified and agreed.  Called Mo with [facility] back and informed her that I spoke with Dawn with [employer] and she said the vac went out on yesterday, so it should arrive either today or tomorrow.  Dawn said she can try to find out and give me a call back.  I also informed Mo that per Bonita,RN that the orders for the area is [place], adaptic and ABD daily, and change vac 1x/week.  Mo stated that she has already done the wound care with the [place], adaptic, and ABD.  But she did ask for verbal orders for the wound care.  Verbal orders were given.  Also informed her once I hear from [facility] I will let her know.  Mo verbalized understanding and agreed.//AB/CMA

## 2019-02-02 NOTE — Telephone Encounter (Signed)
Received called from Mo, RN from [facility]. She is enquiring about the status of wound vac orders. Contact: [phone].

## 2019-02-02 NOTE — Telephone Encounter (Signed)
PST FS POC INR = 1.9 (goal 2.5 - 3.5) will give 7.5mg  warfarin tonight and inject one syringe of LMWH (based upon syringe strenght she has in the home). Recheck PST FS POC INR tomorrow AM on Friday 03-Feb-2019.

## 2019-02-02 NOTE — Telephone Encounter (Signed)
No answer

## 2019-02-03 ENCOUNTER — Telehealth: Payer: Self-pay

## 2019-02-03 ENCOUNTER — Telehealth: Payer: Self-pay | Admitting: Pharmacist

## 2019-02-03 ENCOUNTER — Telehealth: Payer: Self-pay | Admitting: *Deleted

## 2019-02-03 NOTE — Telephone Encounter (Signed)
INR from 8/13 called, [name] has already addressed,  1.9 Please see previous notes

## 2019-02-03 NOTE — Telephone Encounter (Signed)
Texted by patient's daughter that PST FS POC INR today = 2.1 (goal 2.5 - 3.5). On SMX-TMP, known DDI with warfarin with potential of marked hypoprothrombinemic response--not yet demonstrated. Subtherapeutic INR for MPV. Will give only injection of LMWH today in an effort to balance risk of bleeding from surgical wound against the risk of embolic event to/from MPV. I am communicating daily with the patient's daughter who is providing care to her mom within the home. Giving 7.5mg  warfarin today. Will re-check PST FS POC INR tomorrow and dose accordingly. Have advised to observe the surgical wound dressing frequently today to determine if more bleeding is being seen--and if so, to call me.

## 2019-02-03 NOTE — Telephone Encounter (Signed)
Called and spoke with Mrs. Chavis daughter, [name].  She reported that her left back latissimus drain had fallen out.  She does not have any fevers or chills.  She can put Vaseline with gauze over the drain site.  If she develops any seroma or noticeable fluid collection, come to the clinic and we can drain.  We will plan to see her next week.

## 2019-02-03 NOTE — Telephone Encounter (Signed)
Received call from the patient's daughter, Kim Anthony informing us that the one of the patient's drains has come out. The home health nurse is scheduled to visit her when the wound vac arrives but they are still waiting on that. Contact Kim Anthony at: [phone].

## 2019-02-04 DIAGNOSIS — Z4801 Encounter for change or removal of surgical wound dressing: Secondary | ICD-10-CM | POA: Diagnosis not present

## 2019-02-04 DIAGNOSIS — Z4789 Encounter for other orthopedic aftercare: Secondary | ICD-10-CM | POA: Diagnosis not present

## 2019-02-05 DIAGNOSIS — I2782 Chronic pulmonary embolism: Secondary | ICD-10-CM | POA: Diagnosis not present

## 2019-02-05 DIAGNOSIS — Z952 Presence of prosthetic heart valve: Secondary | ICD-10-CM | POA: Diagnosis not present

## 2019-02-05 DIAGNOSIS — Z7901 Long term (current) use of anticoagulants: Secondary | ICD-10-CM | POA: Diagnosis not present

## 2019-02-06 ENCOUNTER — Telehealth: Payer: Self-pay

## 2019-02-06 ENCOUNTER — Telehealth: Payer: Self-pay | Admitting: Pharmacist

## 2019-02-06 DIAGNOSIS — Z4789 Encounter for other orthopedic aftercare: Secondary | ICD-10-CM | POA: Diagnosis not present

## 2019-02-06 DIAGNOSIS — Z4801 Encounter for change or removal of surgical wound dressing: Secondary | ICD-10-CM | POA: Diagnosis not present

## 2019-02-06 NOTE — Telephone Encounter (Signed)
See earlier note for documentation for 06-Feb-2019.

## 2019-02-06 NOTE — Telephone Encounter (Signed)
Called patient's daughter to confirm appointment scheduled for tomorrow. Patient's daughter answered the following questions: 1. To the best of your knowledge, have you been in close contact with any one with a confirmed diagnosis of COVID-19? No 2. Have you had any one or more of the following; fever, chills, cough, shortness of breath, or any flu-like symptoms? No 3. Have you been diagnosed with or have a previous diagnosis of COVID 19? No 4. I am going to go over a few other symptoms with you. Please let me know if you are experiencing any of the following: None of the below a. Ear, nose, or throat discomfort b. A sore throat c. Headache d. Muscle pain e. Diarrhea f. Loss of taste or smell

## 2019-02-06 NOTE — Telephone Encounter (Signed)
The following PST FS POC INRs have been reported by patient's daughter: 04-Feb-2019 INR = 2.4, advised to take 7.5mg  warfarin and only 1 PFS enoxaparin; 05-Feb-2019 INR = STILL 2.4, advised to repeat 7.5mg  warfarin and only 1 PFS enoxaparin; 06-Feb-2019, INR = 3.0 (target 2.5 - 3.5). Was advised to give only 5mg  PO QD until shipment of test strips arrives (should be any day according to daughter, [name]). DISCONTINUE the enoxaparin. Will continue frequent INRs (once strips arrive to home) since patient remains on SMX-TMP.

## 2019-02-07 ENCOUNTER — Other Ambulatory Visit: Payer: Self-pay

## 2019-02-07 ENCOUNTER — Telehealth: Payer: Self-pay

## 2019-02-07 ENCOUNTER — Encounter: Payer: Self-pay | Admitting: Nurse Practitioner

## 2019-02-07 ENCOUNTER — Ambulatory Visit (INDEPENDENT_AMBULATORY_CARE_PROVIDER_SITE_OTHER): Payer: Medicare Other | Admitting: Nurse Practitioner

## 2019-02-07 VITALS — BP 137/86 | HR 89 | Temp 98.8°F | Ht 65.0 in | Wt 170.0 lb

## 2019-02-07 DIAGNOSIS — S41102D Unspecified open wound of left upper arm, subsequent encounter: Secondary | ICD-10-CM

## 2019-02-07 DIAGNOSIS — Z4789 Encounter for other orthopedic aftercare: Secondary | ICD-10-CM | POA: Diagnosis not present

## 2019-02-07 DIAGNOSIS — Z4801 Encounter for change or removal of surgical wound dressing: Secondary | ICD-10-CM | POA: Diagnosis not present

## 2019-02-07 DIAGNOSIS — Z853 Personal history of malignant neoplasm of breast: Secondary | ICD-10-CM

## 2019-02-07 DIAGNOSIS — Z9012 Acquired absence of left breast and nipple: Secondary | ICD-10-CM

## 2019-02-07 MED ORDER — OXYCODONE-ACETAMINOPHEN 5-325 MG PO TABS: 1.0000 | ORAL_TABLET | Freq: Four times a day (QID) | ORAL | 0 refills | Status: AC | PRN

## 2019-02-07 MED ORDER — FLUOCINONIDE 0.05 % EX CREA: 1.0000 "application " | TOPICAL_CREAM | Freq: Two times a day (BID) | CUTANEOUS | 0 refills | Status: DC

## 2019-02-07 NOTE — Telephone Encounter (Signed)
Received call from occupational therapist, Will Marseilles with [facility]. He is requesting verbal authorization for OT visits: once a week for the first week and twice a week for the 2 weeks after that. He also would like to inform us of an issue with her vital signs. Contact Will: [phone].

## 2019-02-07 NOTE — Progress Notes (Signed)
Patient ID: Lakeysha Slutsky, female    DOB: 08-29-1950, 68 y.o.   [name]: [id number]  C.C.: post-op follow up  Nazariah Cadet is a 68 yo female with hx of breast cancer in 2001 with left mastectomy, complete axillary dissection, and postop radiation. She developed an open wound under her left axilla and underwent an excision of left chest wall wound with debridement and closure on 12/28/18. The incision opened and she underwent latissimus flap to left breast on 01/17/19. Patient developed a left chest wall wound and went back to the OR on 01/27/19 for Irrigation and Debridement of left chest wall, placement of Acell, and placement of wound Vac. She presents today with her daughter for post-op follow up for left chest wall wound. The wound vac was changed by her home health nurse yesterday. The VAC was doing well at home, but started beeping once she arrived to this office. The JP drain in the back fell out last week. The JP drain in the front is not activated. She has a rash on her right upper arm from the tape around the previous IV site. Patient states she is still very sore and needs more pain medication. Patient states the percocet works well.    Review of Systems  Constitutional: Positive for activity change. Negative for chills and fever.  HENT: Negative.   Respiratory:       Occasionally short of breath  Cardiovascular: Negative.   Gastrointestinal: Negative.   Musculoskeletal:       Left surgical site chest pain  Skin: Positive for wound.  Psychiatric/Behavioral: Negative.     Past Medical History:  Diagnosis Date  . Arthritis   . Asthma   . Breast cancer ([place])    Left Breast Cancer  . CHF (congestive heart failure) ([place])   . COPD (chronic obstructive pulmonary disease) ([place])   . Coronary artery disease   . History of mitral valve replacement with mechanical valve   . HLD (hyperlipidemia)   . Hypertension   . Pulmonary embolism [facility])     Past Surgical History:  Procedure Laterality Date   . APPLICATION OF A-CELL OF CHEST/ABDOMEN Left 01/27/2019   Procedure: APPLICATION OF A-CELL OF CHEST;  Surgeon: Wallace Going, DO;  Location: [facility];  Service: Plastics;  Laterality: Left;  . APPLICATION OF WOUND VAC N/A 01/27/2019   Procedure: APPLICATION OF WOUND VAC;  Surgeon: Wallace Going, DO;  Location: [facility];  Service: Plastics;  Laterality: N/A;  . COLONOSCOPY WITH PROPOFOL N/A 06/07/2018   Procedure: COLONOSCOPY WITH PROPOFOL;  Surgeon: Wilford Corner, MD;  Location: [facility];  Service: Endoscopy;  Laterality: N/A;  . DEBRIDEMENT AND CLOSURE WOUND Left 12/28/2018   Procedure: Debridement And Closure Wound;  Surgeon: Coralie Keens, MD;  Location: [place];  Service: General;  Laterality: Left;  . INCISION AND DRAINAGE OF WOUND Left 01/27/2019   Procedure: IRRIGATION AND DEBRIDEMENT OF CHEST WALL;  Surgeon: Wallace Going, DO;  Location: [facility];  Service: Plastics;  Laterality: Left;  . LATISSIMUS FLAP TO BREAST Left 01/18/2019   Procedure: LEFT LATISSIMUS FLAP TO BREAST;  Surgeon: Wallace Going, DO;  Location: [place];  Service: Plastics;  Laterality: Left;  [name] MASTECTOMY Left 2000s   for breast cancer, also s/p radiation and chemo  . MITRAL VALVE REPLACEMENT     mechanical  . POLYPECTOMY  06/07/2018   Procedure: POLYPECTOMY;  Surgeon: Wilford Corner, MD;  Location: [facility];  Service: Endoscopy;;  . WOUND  DEBRIDEMENT Left 12/28/2018   Procedure: EXCISION OF LEFT CHRONIC CHEST WALL WOUND;  Surgeon: Coralie Keens, MD;  Location: [place];  Service: General;  Laterality: Left;      Current Outpatient Medications:  .  albuterol (PROVENTIL) (2.5 MG/3ML) 0.083% nebulizer solution, Take 3 mLs (2.5 mg total) by nebulization every 6 (six) hours as needed for wheezing or shortness of breath., Disp: 75 mL, Rfl: 12 .  albuterol (VENTOLIN HFA) 108 (90 Base) MCG/ACT inhaler, Inhale 1-2 puffs into the lungs every 6 (six) hours as needed for wheezing or shortness of  breath., Disp: 6.7 g, Rfl: 2 .  ANORO ELLIPTA 62.5-25 MCG/INH AEPB, Inhale 1 puff by mouth once daily (Patient taking differently: Inhale 1 puff into the lungs daily. ), Disp: 60 each, Rfl: 11 .  Calcium-Phosphorus-Vitamin D (CITRACAL +D3 PO), Take 1 tablet by mouth 2 (two) times a day., Disp: , Rfl:  .  diazepam (VALIUM) 2 MG tablet, Take 1 tablet (2 mg total) by mouth every 12 (twelve) hours as needed for muscle spasms., Disp: 20 tablet, Rfl: 0 .  fluocinonide cream (LIDEX) 5.95 %, Apply 1 application topically 2 (two) times daily. Apply to rash on right arm., Disp: 30 g, Rfl: 0 .  fluticasone (FLONASE) 50 MCG/ACT nasal spray, Place 2 sprays into both nostrils daily. (Patient taking differently: Place 1 spray into both nostrils daily as needed for allergies. ), Disp: 16 g, Rfl: 2 .  gabapentin (NEURONTIN) 600 MG tablet, Take 1 tablet (600 mg total) by mouth at bedtime., Disp: 60 tablet, Rfl: 4 .  metoprolol succinate (TOPROL-XL) 25 MG 24 hr tablet, TAKE 1/2 (ONE-HALF) TABLET BY MOUTH ONCE DAILY (Patient taking differently: Take 12.5 mg by mouth at bedtime. ), Disp: 45 tablet, Rfl: 0 .  oxyCODONE-acetaminophen (PERCOCET/ROXICET) 5-325 MG tablet, Take 1 tablet by mouth every 6 (six) hours as needed for up to 5 days for severe pain., Disp: 20 tablet, Rfl: 0 .  polyethylene glycol (MIRALAX / GLYCOLAX) 17 g packet, Take 17 g by mouth daily as needed for mild constipation., Disp: 14 each, Rfl: 0 .  potassium chloride SA (K-DUR) 20 MEQ tablet, Take 2 tablets (40 mEq total) by mouth daily., Disp: 180 tablet, Rfl: 0 .  senna (SENOKOT) 8.6 MG TABS tablet, Take 1 tablet (8.6 mg total) by mouth 2 (two) times daily., Disp: 120 tablet, Rfl: 0 .  simvastatin (ZOCOR) 20 MG tablet, Take 1 tablet by mouth once daily (Patient taking differently: Take 20 mg by mouth daily. ), Disp: 90 tablet, Rfl: 0 .  sulfamethoxazole-trimethoprim (BACTRIM DS) 800-160 MG tablet, Take 1 tablet by mouth 2 (two) times daily for 10 days.,  Disp: 20 tablet, Rfl: 0 .  warfarin (COUMADIN) 7.5 MG tablet, Take 1 tablet (7.5 mg total) by mouth one time only at 6 PM., Disp: 20 tablet, Rfl: 0   Objective:   Vitals:   02/07/19 1330  BP: 137/86  Pulse: 89  Temp: 98.8 F (37.1 C)  SpO2: 96%    Physical Exam  General: alert, calm, no acute distress HEENT:atraumatic Neck: supple, full ROM Chest: wound VAC present on left chest and left axilla; left posterior back incision clean, dry, intact and covered with mepilex; moderate tenderness in upper left chest; JP drain in left axilla with small amount thick cloudy output in bulb Lungs: mild intermittent shortness of breath  Breast: left breast mastectomy Musculoskeletal: MAEx4 Neuro: A&O x3, calm, cooperative, steady gait Skin: vesicular rash on RUE in ~10 cm x 7  cm border    Assessment & Plan:  Kery Haltiwanger is a 68 yo female POD #11 s/p Irrigation and Debridement of left chest wall, placement of Acell, and placement of wound Vac. She is still having pain. The VAC was not removed today since it was just replaced yesterday. The dressing was reinforced in a few small areas that were causing leaks. The JP drain was charged and milked. Continue current wound VAC management until 8/24. Patient given instructions to remove wound VAC on Monday 8/24 and cover with [place] gel and gauze/ABD. She may shower at this time. She will come in for follow up on Tuesday 8/25. Will plan to resume VAC changes on Wednesday 8/26. Pain medication prescribed. She has contact dermatitis from the tegaderm at her previous IV site. A steroid cream was prescribed.     Alfredo Batty, NP

## 2019-02-08 NOTE — Telephone Encounter (Signed)
Call to [facility]- spoke to Will, RN he was requesting a verbal order for OT for the pt to complete as follows: once a week for 1st week & then twice a week for 2 weeks after that- I advised him this was ok to authorize per Dr. Marla Roe Will did report that pt is complaining of 8/10 on pain scale & that she requesting pain meds I informed him that I would forward this request to Dr. Marla Roe or [place], [place] He denies any other complications [place]

## 2019-02-10 ENCOUNTER — Telehealth: Payer: Self-pay

## 2019-02-10 DIAGNOSIS — Z4789 Encounter for other orthopedic aftercare: Secondary | ICD-10-CM | POA: Diagnosis not present

## 2019-02-10 DIAGNOSIS — Z4801 Encounter for change or removal of surgical wound dressing: Secondary | ICD-10-CM | POA: Diagnosis not present

## 2019-02-10 MED ORDER — TRIAMCINOLONE ACETONIDE 0.025 % EX OINT: 1.0000 "application " | TOPICAL_OINTMENT | Freq: Two times a day (BID) | CUTANEOUS | 0 refills | Status: DC

## 2019-02-10 NOTE — Telephone Encounter (Signed)
Received call from [facility]. Spoke with nurse [name] who stated she was currently with the patient. She wanted to know if the wound vac should be changed today since it had been changed on Monday. Per Dr. Marla Roe, the vac can be changed today. Relayed this information to the nurse with [facility].

## 2019-02-11 DIAGNOSIS — Z4789 Encounter for other orthopedic aftercare: Secondary | ICD-10-CM | POA: Diagnosis not present

## 2019-02-11 DIAGNOSIS — Z4801 Encounter for change or removal of surgical wound dressing: Secondary | ICD-10-CM | POA: Diagnosis not present

## 2019-02-13 ENCOUNTER — Telehealth: Payer: Self-pay | Admitting: *Deleted

## 2019-02-13 ENCOUNTER — Telehealth: Payer: Self-pay

## 2019-02-13 DIAGNOSIS — Z4801 Encounter for change or removal of surgical wound dressing: Secondary | ICD-10-CM | POA: Diagnosis not present

## 2019-02-13 DIAGNOSIS — Z4789 Encounter for other orthopedic aftercare: Secondary | ICD-10-CM | POA: Diagnosis not present

## 2019-02-13 NOTE — Telephone Encounter (Signed)

## 2019-02-13 NOTE — Telephone Encounter (Signed)
Called and spoke with the patient's daughter [name]), and informed her that we received a fax for a prior authorization for Fluocinonide 0.05% cream.  Informed [name] that I was asked to call and see if the patient still needed the cream.  [name] stated that the patients insurance would not pay for the Fluocinonide cream, so [name],PA sent in another prescription for the patient.  They picked up the new prescription and the patient is using it.  I informed her that I will disregard the fax for the prior authorization.  She verbalized understanding and agreed.//AB/CMA

## 2019-02-14 ENCOUNTER — Ambulatory Visit (INDEPENDENT_AMBULATORY_CARE_PROVIDER_SITE_OTHER): Payer: Medicare Other | Admitting: Surgical

## 2019-02-14 ENCOUNTER — Encounter: Payer: Self-pay | Admitting: Surgical

## 2019-02-14 ENCOUNTER — Other Ambulatory Visit: Payer: Self-pay

## 2019-02-14 VITALS — BP 148/81 | HR 94 | Temp 98.2°F | Ht 65.0 in | Wt 166.6 lb

## 2019-02-14 DIAGNOSIS — Z9012 Acquired absence of left breast and nipple: Secondary | ICD-10-CM

## 2019-02-14 DIAGNOSIS — S41102D Unspecified open wound of left upper arm, subsequent encounter: Secondary | ICD-10-CM

## 2019-02-14 DIAGNOSIS — Z4801 Encounter for change or removal of surgical wound dressing: Secondary | ICD-10-CM | POA: Diagnosis not present

## 2019-02-14 DIAGNOSIS — Z4789 Encounter for other orthopedic aftercare: Secondary | ICD-10-CM | POA: Diagnosis not present

## 2019-02-14 MED ORDER — OXYCODONE-ACETAMINOPHEN 5-325 MG PO TABS: 1.0000 | ORAL_TABLET | Freq: Four times a day (QID) | ORAL | 0 refills | Status: AC | PRN

## 2019-02-14 NOTE — Progress Notes (Signed)
Subjective:     Patient ID: Kim Anthony, female    DOB: [birth year], 68 y.o.   [name]: [id number]  Chief Complaint  Patient presents with  . Follow-up    1 week    HPI: The patient is a 68 y.o. female here for follow-up on her left chest wall wound.  Her last surgical intervention was on 01/27/2019; irrigation and debridement of left chest wall with placement of ACell and placement of wound VAC.  Her daughter is here with her today. They report that she has been doing great overall.  But in the past few days her pain is gotten a little bit worse, suspect this is because she had ran out of her Percocet.  Her pain today is an 8 out of 10 along her left chest wall, extending into the superior aspect of the wound/chest.  She reports that she gets up at night sometimes due to the pain and has been smoking.  The wound VAC was changed by home health nurse yesterday and she had 4 x 4's and ABD taped over the wound.   No fevers, chills, nausea, vomiting.  Her range of motion has improved.      Review of Systems  Constitutional: Negative for chills, fever and malaise/fatigue.       Positive activity change.  Respiratory: Negative.   Cardiovascular: Negative.   Genitourinary: Negative.   Musculoskeletal: Positive for myalgias.  Skin: Positive for rash (Right arm, inferior aspect of wound).     Objective:   Vital Signs BP (!) 148/81 (BP Location: Right Arm, Patient Position: Sitting, Cuff Size: Normal)   Pulse 94   Temp 98.2 F (36.8 C) (Temporal)   Ht 5\' 5"  (1.651 m)   Wt 166 lb 9.6 oz (75.6 kg)   SpO2 95%   BMI 27.72 kg/m  Vital Signs and Nursing Note Reviewed  Physical Exam  Constitutional: She is oriented to person, place, and time and well-developed, well-nourished, and in no distress. No distress.  HENT:  Head: Normocephalic and atraumatic.  Cardiovascular: Normal rate.  Pulmonary/Chest: Effort normal.    Abdominal: Soft.  Musculoskeletal:        General: Tenderness present.  No edema.  Neurological: She is alert and oriented to person, place, and time. Gait normal.  Skin: Skin is warm and dry. Rash noted. She is not diaphoretic. There is erythema.     Psychiatric: Mood and affect normal.      Assessment/Plan:     ICD-10-CM   1. Open wound of left axillary region, subsequent encounter  S41.102D   2. Acquired absence of left breast  Z90.12     Sorbact removed.  Patient's wound is improving.  It is beginning to incorporate the ACell.  She has good blood flow and color.  There is no sign of infection.  Donated a cell sheet placed over the medial aspect of her wound.  Covered with K-Y jelly, Adaptic,4 x 4 gauze and ABD.  Home health nurses can remove ABD 4 x 4 gauze and place wound VAC over ACell sheet and Adaptic after applying K-Y jelly.  Continue with current plan for wound VAC changes.  She is still in significant pain which keeps her up at night.  Will send in prescription for pain control.  She knows to supplement with Tylenol or ibuprofen.  Only use Percocet if n significant pain, she understands and agrees.  Counseled on smoking cessation.  Would like to see her back in 2 weeks.  She is getting close to skin graft.   Kermit Balo Cherye Gaertner, PA-C 02/14/2019, 4:26 PM

## 2019-02-15 ENCOUNTER — Telehealth: Payer: Self-pay | Admitting: *Deleted

## 2019-02-15 ENCOUNTER — Telehealth: Payer: Self-pay | Admitting: Pharmacist

## 2019-02-15 ENCOUNTER — Telehealth: Payer: Self-pay

## 2019-02-15 DIAGNOSIS — Z4789 Encounter for other orthopedic aftercare: Secondary | ICD-10-CM | POA: Diagnosis not present

## 2019-02-15 DIAGNOSIS — Z4801 Encounter for change or removal of surgical wound dressing: Secondary | ICD-10-CM | POA: Diagnosis not present

## 2019-02-15 NOTE — Telephone Encounter (Signed)
Received text from patient's daughter last PM indicating INR 2.3 on 35mg  warfarin/wk (goal 2.5 - 3.5) Instructed to INCREASE to 5mg  QD except on Tu/Fr--give 1 and 1/2 x 5mg  (7.5mg  dose) on these days. Repeat INR in 1 week.

## 2019-02-15 NOTE — Telephone Encounter (Signed)
Received fax from [facility] with INR results from 01/02/2019 to 02/14/2019/2020. Last INR 2.3 on 02/14/2019. Dr. Elie Confer is already aware. Results placed in Dr. Gladstone Pih box. Hubbard Hartshorn, RN, BSN

## 2019-02-15 NOTE — Telephone Encounter (Signed)
Received call from [facility] with [facility] requesting clarification on orders for wound vac. Contact: [phone].

## 2019-02-16 ENCOUNTER — Other Ambulatory Visit: Payer: Self-pay

## 2019-02-16 ENCOUNTER — Encounter: Payer: Self-pay | Admitting: Internal Medicine

## 2019-02-16 ENCOUNTER — Ambulatory Visit (INDEPENDENT_AMBULATORY_CARE_PROVIDER_SITE_OTHER): Payer: Medicare Other | Admitting: Internal Medicine

## 2019-02-16 ENCOUNTER — Other Ambulatory Visit: Payer: Self-pay | Admitting: *Deleted

## 2019-02-16 VITALS — BP 111/71 | HR 84 | Temp 98.7°F | Wt 167.3 lb

## 2019-02-16 DIAGNOSIS — Z4801 Encounter for change or removal of surgical wound dressing: Secondary | ICD-10-CM | POA: Diagnosis not present

## 2019-02-16 DIAGNOSIS — D539 Nutritional anemia, unspecified: Secondary | ICD-10-CM | POA: Insufficient documentation

## 2019-02-16 DIAGNOSIS — C50919 Malignant neoplasm of unspecified site of unspecified female breast: Secondary | ICD-10-CM | POA: Insufficient documentation

## 2019-02-16 DIAGNOSIS — I10 Essential (primary) hypertension: Secondary | ICD-10-CM

## 2019-02-16 DIAGNOSIS — Z978 Presence of other specified devices: Secondary | ICD-10-CM

## 2019-02-16 DIAGNOSIS — Z4789 Encounter for other orthopedic aftercare: Secondary | ICD-10-CM | POA: Diagnosis not present

## 2019-02-16 DIAGNOSIS — F17210 Nicotine dependence, cigarettes, uncomplicated: Secondary | ICD-10-CM | POA: Diagnosis not present

## 2019-02-16 DIAGNOSIS — D649 Anemia, unspecified: Secondary | ICD-10-CM | POA: Insufficient documentation

## 2019-02-16 DIAGNOSIS — Z853 Personal history of malignant neoplasm of breast: Secondary | ICD-10-CM | POA: Insufficient documentation

## 2019-02-16 DIAGNOSIS — Z79899 Other long term (current) drug therapy: Secondary | ICD-10-CM | POA: Diagnosis not present

## 2019-02-16 MED ORDER — LISINOPRIL 10 MG PO TABS: 10.0000 mg | ORAL_TABLET | Freq: Every day | ORAL | 11 refills | Status: DC

## 2019-02-16 MED ORDER — SIMVASTATIN 20 MG PO TABS: 20.0000 mg | ORAL_TABLET | Freq: Every day | ORAL | 0 refills | Status: DC

## 2019-02-16 MED ORDER — FUROSEMIDE 40 MG PO TABS: 40.0000 mg | ORAL_TABLET | Freq: Every day | ORAL | 11 refills | Status: DC

## 2019-02-16 NOTE — Assessment & Plan Note (Signed)
Patient's past labs show a macrocytic anemia.  I cannot find where this problem has been worked up previously.  Previously diagnosed as anemia of chronic disease, but there has not been any B12 folate or iron studies performed.  Patient has a remote history of breast cancer 20 years ago.  Patient will be following up in 1 month for blood pressure.  Plan to perform these labs at that time.

## 2019-02-16 NOTE — Telephone Encounter (Signed)
Mattthew, PA called and spoke with Olivia Mackie with [facility] regarding the message below.  Verbal orders was given to change wound vac (1-2x/week) depending on soil level.  Remove wound vac on (Monday-02/27/19) before patients appointment to see [name],PA on (Tues. 02/28/19).//AB/CMA

## 2019-02-16 NOTE — Assessment & Plan Note (Addendum)
Patient was recently discharged from the hospital on 8/12 for wound management and wound VAC placement on the left axillary region.  During her hospital stay, her lisinopril and furosemide were held due to low blood pressure. She states that she has continued her medications since being discharged.  Her blood pressure today is 111/71.  I counseled the patient regarding her blood pressure medication and told her to continue her blood pressure medications. I will get her to make a 1 month follow-up appointment 1 month to recheck her blood pressure.  I will make adjustments to her medications at that time.  Plan: - Refilled her prescription for furosemide and lisinopril.

## 2019-02-16 NOTE — Progress Notes (Signed)
   CC: Hypertension  HPI:  [name] is a 68 y.o. female with a past medical history stated below and presents today for hypertension. Please see problem based assessment and plan for additional details.    Past Medical History:  Diagnosis Date  . Arthritis   . Asthma   . Breast cancer ([place]) 2001   Left Breast Cancer  . CHF (congestive heart failure) ([place])   . COPD (chronic obstructive pulmonary disease) ([place])   . Coronary artery disease   . History of mitral valve replacement with mechanical valve   . HLD (hyperlipidemia)   . Hypertension   . Pulmonary embolism ([place])        Review of Systems: Review of Systems  Constitutional: Negative for chills, fever and malaise/fatigue.  Eyes: Negative for blurred vision.  Respiratory: Negative for shortness of breath.   Cardiovascular: Negative for chest pain, palpitations, orthopnea and leg swelling.  Gastrointestinal: Negative for abdominal pain, blood in stool, constipation, nausea and vomiting.  Genitourinary: Negative for dysuria.  Musculoskeletal: Negative for back pain and myalgias.  Neurological: Negative for dizziness and weakness.     Vitals:   02/16/19 0915  BP: 111/71  Pulse: 84  Temp: 98.7 F (37.1 C)  TempSrc: Oral  Weight: 167 lb 4.8 oz (75.9 kg)     Physical Exam: Physical Exam  Constitutional: She is oriented to person, place, and time and well-developed, well-nourished, and in no distress.  Cardiovascular: Normal rate, regular rhythm, normal heart sounds and intact distal pulses. Exam reveals no gallop and no friction rub.  No murmur heard. Pulmonary/Chest: Effort normal and breath sounds normal.  Abdominal: Soft. There is no abdominal tenderness.  Musculoskeletal:        General: No tenderness or edema.  Neurological: She is alert and oriented to person, place, and time.  Skin: Skin is warm and dry.        Assessment & Plan:   See Encounters Tab for problem based charting.  Patient  seen with Dr. Dareen Piano

## 2019-02-16 NOTE — Patient Instructions (Signed)
Thank you, Kim Anthony for allowing [place] to provide your care today. Today we discussed blood pressure.    I have ordered basic metabolic panel and anemia labs for you.  The labs will be performed in 2 weeks.  We will call if any are abnormal.    I have place a referrals to none.   I have ordered the following tests: None  I have ordered the following medication/changed the following medications: I will refill your lisinopril and your furosemide medications.  Please follow-up in please follow-up in 2 weeks so we can perform those labs and recheck your blood pressure.    Should you have any questions or concerns please call the internal medicine clinic at [phone].    Kim Anthony, D.O. [facility]

## 2019-02-17 DIAGNOSIS — Z4789 Encounter for other orthopedic aftercare: Secondary | ICD-10-CM | POA: Diagnosis not present

## 2019-02-17 DIAGNOSIS — Z4801 Encounter for change or removal of surgical wound dressing: Secondary | ICD-10-CM | POA: Diagnosis not present

## 2019-02-20 DIAGNOSIS — Z4801 Encounter for change or removal of surgical wound dressing: Secondary | ICD-10-CM | POA: Diagnosis not present

## 2019-02-20 DIAGNOSIS — Z4789 Encounter for other orthopedic aftercare: Secondary | ICD-10-CM | POA: Diagnosis not present

## 2019-02-20 NOTE — Progress Notes (Signed)
Internal Medicine Clinic Attending  I saw and evaluated the patient.  I personally confirmed the key portions of the history and exam documented by Dr. Coe and I reviewed pertinent patient test results.  The assessment, diagnosis, and plan were formulated together and I agree with the documentation in the resident's note.    

## 2019-02-21 ENCOUNTER — Telehealth: Payer: Self-pay | Admitting: Pharmacist

## 2019-02-21 DIAGNOSIS — Z4801 Encounter for change or removal of surgical wound dressing: Secondary | ICD-10-CM | POA: Diagnosis not present

## 2019-02-21 DIAGNOSIS — Z4789 Encounter for other orthopedic aftercare: Secondary | ICD-10-CM | POA: Diagnosis not present

## 2019-02-21 NOTE — Telephone Encounter (Signed)
Received text from patient's daugher at 1909h 20-Feb-2019 reporting PST FS POC INR = 2.7 (goal 2.5 -3.5) on 5mg  warfarin QD--EXCEPT on Tu/Fri, take 1 and 1/2 x 5mg  (7.5mg  dose) on Tu/Fri. Instructed to CONTINUE this regimen and repeat PSF FS POC INR 1wk. Denies any signs/symptoms of bleeding. No other endorsement of any kind of symptoms. Has adequate amount of warfarin on-hand.

## 2019-02-22 DIAGNOSIS — Z4789 Encounter for other orthopedic aftercare: Secondary | ICD-10-CM | POA: Diagnosis not present

## 2019-02-22 DIAGNOSIS — Z4801 Encounter for change or removal of surgical wound dressing: Secondary | ICD-10-CM | POA: Diagnosis not present

## 2019-02-23 ENCOUNTER — Telehealth: Payer: Self-pay | Admitting: *Deleted

## 2019-02-23 ENCOUNTER — Telehealth: Payer: Self-pay

## 2019-02-23 DIAGNOSIS — Z4801 Encounter for change or removal of surgical wound dressing: Secondary | ICD-10-CM | POA: Diagnosis not present

## 2019-02-23 DIAGNOSIS — Z4789 Encounter for other orthopedic aftercare: Secondary | ICD-10-CM | POA: Diagnosis not present

## 2019-02-23 NOTE — Telephone Encounter (Signed)
Received call from [facility] with [facility] informing us that Kim Anthony is complaining of severe itching around her wound site. She would like something prescribed for this. Please contact patient.

## 2019-02-23 NOTE — Telephone Encounter (Signed)
Received call from Will [place] with OT requesting orders for the patient:1) Requesting to extend OT:1 week-1,2 week-2, :2) Any restrictions on ROM on (L) shoulder-Adduction and Flexion.  Informed Will I will give him a call back after speaking with [name],PA. (Call back [phone]).  Will verbalized understanding and agreed.//AB/CMA

## 2019-02-23 NOTE — Telephone Encounter (Signed)
Called and spoke with Olivia Mackie with [facility] regarding the message below.  Per [name],PA: Have the patient to take OTC Allegra during the day and Benadryl at night for 2 weeks unless the itching stops.  Olivia Mackie verbalized understanding and agreed.  She said she will call the patient and less her know.//AB/CMA

## 2019-02-24 ENCOUNTER — Telehealth: Payer: Self-pay

## 2019-02-24 DIAGNOSIS — Z4789 Encounter for other orthopedic aftercare: Secondary | ICD-10-CM | POA: Diagnosis not present

## 2019-02-24 DIAGNOSIS — Z4801 Encounter for change or removal of surgical wound dressing: Secondary | ICD-10-CM | POA: Diagnosis not present

## 2019-02-24 MED ORDER — DOXYCYCLINE HYCLATE 100 MG PO TABS: 100.0000 mg | ORAL_TABLET | Freq: Two times a day (BID) | ORAL | 0 refills | Status: DC

## 2019-02-24 NOTE — Telephone Encounter (Signed)
Received call from [name] who is out with patient. Would vac is functioning properly, but above vac the skin is "hard as a rock and hot".   Spoke with Dr Marla Roe who stated that the hardness is normal due to past radiation. Dr. Marla Roe said she would send in an antibiotic as the patient is coming in office on Tuesday.  Relayed above information to Kim Anthony. [name] agreeable with plan; stated that she would return to the patient's house on Monday to change wound vac.

## 2019-02-24 NOTE — Telephone Encounter (Signed)
Called and spoke with Will Schalla with OT and informed him that per [name],PA-Okay to extend OT for 1 week-1,2 week-2.  As far as restrictions-whatever the patient can tolerate.  Will verbalized understanding and agreed.//AB/CMA

## 2019-02-24 NOTE — Telephone Encounter (Signed)
Received call from [place] with home health. Patient's wound vac was changed yesterday due to leakage, but orders state that wound vac should be changed today. [name] would like call to know to change vac or not. Best contact is [phone]

## 2019-02-27 DIAGNOSIS — Z4801 Encounter for change or removal of surgical wound dressing: Secondary | ICD-10-CM | POA: Diagnosis not present

## 2019-02-27 DIAGNOSIS — Z4789 Encounter for other orthopedic aftercare: Secondary | ICD-10-CM | POA: Diagnosis not present

## 2019-02-28 ENCOUNTER — Ambulatory Visit (INDEPENDENT_AMBULATORY_CARE_PROVIDER_SITE_OTHER): Payer: Medicare Other | Admitting: Surgical

## 2019-02-28 ENCOUNTER — Encounter: Payer: Self-pay | Admitting: Surgical

## 2019-02-28 ENCOUNTER — Other Ambulatory Visit: Payer: Self-pay

## 2019-02-28 VITALS — BP 138/81 | HR 82 | Temp 97.3°F | Ht 65.0 in | Wt 160.0 lb

## 2019-02-28 DIAGNOSIS — S41102D Unspecified open wound of left upper arm, subsequent encounter: Secondary | ICD-10-CM

## 2019-02-28 NOTE — Progress Notes (Signed)
Subjective:     Patient ID: Kim Anthony, female    DOB: [birth year], 68 y.o.   [name]: [id number]  Chief Complaint  Patient presents with  . Follow-up    2 weeks    HPI: The patient is a 68 y.o. female here for follow-up on her left chest wall wound.   At her last visit, Donated Acell sheet was applied to her wound. Home health has been assisting with dressing and vac changes.  [name] is doing well today.  She reports that she is working with OT and this has been very helpful for her range of motion.  Home health has been assisting with wound VAC changes and dressing changes.  Her wound is incorporating the ACell nicely with some good granulation tissue forming.  The overall wound bed is pink in color and has good granulation tissue.  The medial aspect is still incorporating some of ACell.  She has some skin abrasions from tape/wound VAC.  She reports this is very itchy.  She has been taking Benadryl which she reports has not been very helpful.  There is no discernible rash on exam.  Overall her wound is doing well with no sign of infection.  No fever, chills, nausea, vomiting.  Pain is been adequately controlled on ibuprofen and Tylenol.  She continues to smoke.   Review of Systems  Constitutional: Negative for chills, fever, malaise/fatigue and weight loss.       + Activity change  Respiratory: Negative.   Cardiovascular: Negative.   Genitourinary: Negative.   Musculoskeletal: Negative for myalgias.  Skin: Positive for itching. Negative for rash.  Neurological: Negative.      Objective:   Vital Signs BP 138/81 (BP Location: Right Arm, Patient Position: Sitting, Cuff Size: Normal)   Pulse 82   Temp (!) 97.3 F (36.3 C) (Temporal)   Ht 5\' 5"  (1.651 m)   Wt 160 lb (72.6 kg)   SpO2 96%   BMI 26.63 kg/m  Vital Signs and Nursing Note Reviewed  Physical Exam  Constitutional: She is oriented to person, place, and time and well-developed, well-nourished, and in no  distress.  HENT:  Head: Normocephalic and atraumatic.  Pulmonary/Chest: Effort normal.    Musculoskeletal:        General: Tenderness present. No deformity or edema.     Left shoulder: She exhibits decreased range of motion. She exhibits no tenderness.  Neurological: She is alert and oriented to person, place, and time. Gait normal.  Skin: Skin is warm and dry. No rash noted. She is not diaphoretic. No erythema.  Positive skin abrasion inferior aspect of wound due to tape.  Psychiatric: Mood and affect normal.      Assessment/Plan:     ICD-10-CM   1. Open wound of left axillary region, subsequent encounter  S41.102D     Continue with wound VAC changes twice weekly.  New donated ACell powder and sheet applied to left chest wall wound.  Begin with VAC changes on Thursday.  Follow-up in 2 weeks. Continue with OT.  Hoping patient's wound will begin to epithelialize on her own, but this does not seem likely as she is beginning to hypergranulate and there is no sign of epithelialization at this time.   Kermit Balo Scottlyn Mchaney, PA-C 02/28/2019, 9:16 AM

## 2019-03-01 ENCOUNTER — Telehealth: Payer: Self-pay | Admitting: Pharmacist

## 2019-03-01 DIAGNOSIS — Z4789 Encounter for other orthopedic aftercare: Secondary | ICD-10-CM | POA: Diagnosis not present

## 2019-03-01 DIAGNOSIS — Z4801 Encounter for change or removal of surgical wound dressing: Secondary | ICD-10-CM | POA: Diagnosis not present

## 2019-03-01 NOTE — Telephone Encounter (Signed)
Texted by patient's daugher at 1810h 28-Feb-2019 to my personal cell phone. PST POC FS INR results = 2.4 (goal 2.5 - 3.5). Advised to INCREASE from 40mg  warfarin/wk to 45mg  warfarin/wk (1&1/2 x 5mg  Su/Tu/Th/Sa; 1x5mg  MWF). Repeat INR 14-Mar-2019. Denies bleeding, denies any symptoms/signs of embolic disease. No new medications, no missed doses. Adequate supply of warfarin on-hand.

## 2019-03-02 ENCOUNTER — Telehealth: Payer: Self-pay | Admitting: *Deleted

## 2019-03-02 DIAGNOSIS — Z4801 Encounter for change or removal of surgical wound dressing: Secondary | ICD-10-CM | POA: Diagnosis not present

## 2019-03-02 DIAGNOSIS — Z4789 Encounter for other orthopedic aftercare: Secondary | ICD-10-CM | POA: Diagnosis not present

## 2019-03-02 NOTE — Telephone Encounter (Signed)
Faxed new wound care orders for the patient to [facility] @ [phone]).  Confirmation received.//AB/CMA

## 2019-03-06 ENCOUNTER — Telehealth: Payer: Self-pay

## 2019-03-06 ENCOUNTER — Other Ambulatory Visit: Payer: Self-pay | Admitting: Pharmacist

## 2019-03-06 DIAGNOSIS — Z4789 Encounter for other orthopedic aftercare: Secondary | ICD-10-CM | POA: Diagnosis not present

## 2019-03-06 DIAGNOSIS — Z4801 Encounter for change or removal of surgical wound dressing: Secondary | ICD-10-CM | POA: Diagnosis not present

## 2019-03-06 MED ORDER — WARFARIN SODIUM 5 MG PO TABS: ORAL_TABLET | ORAL | 2 refills | Status: DC

## 2019-03-06 NOTE — Telephone Encounter (Addendum)
Received a call from [place] at [facility], home health nurse who is taking care of Kim Anthony.  She reported that [name] is having an allergic reaction to something and noticed some welts that popped up suddenly over the past day.  She is not having any issues breathing, difficulty swallowing.   She has not had any new detergents, creams, new foods.  I called and spoke with [name] daughter who reported that they are unsure what is causing it.  We are scheduled to see the patient in 1 week.  In the meantime, start taking Benadryl at night and Allegra throughout the day.  Previously sent in steroid cream, which patient can use on some areas of itching.  Patient may need an oral steroid, but this would delay her healing process. Will do a tele-visit with patient on Wednesday to evaluate for changes. If no improvement, may move forward with steroid PO.  No fever, chills, nausea, vomiting.

## 2019-03-06 NOTE — Telephone Encounter (Signed)
Received voicemail from Okay at [facility]. She saw patient today for wound care and reports "welts" on her arms and legs which occurred suddenly. The patient also reports 8/10 pain to her left arm in the area of the wound. Olivia Mackie can be contacted at: [phone].

## 2019-03-07 ENCOUNTER — Other Ambulatory Visit: Payer: Self-pay | Admitting: Surgical

## 2019-03-07 DIAGNOSIS — Z952 Presence of prosthetic heart valve: Secondary | ICD-10-CM | POA: Diagnosis not present

## 2019-03-07 DIAGNOSIS — I2782 Chronic pulmonary embolism: Secondary | ICD-10-CM | POA: Diagnosis not present

## 2019-03-07 DIAGNOSIS — Z7901 Long term (current) use of anticoagulants: Secondary | ICD-10-CM | POA: Diagnosis not present

## 2019-03-07 NOTE — Telephone Encounter (Signed)
Refill approved per Dr. Marla Roe and send to the pharmacy by e-script.//AB/CMA

## 2019-03-08 DIAGNOSIS — Z4801 Encounter for change or removal of surgical wound dressing: Secondary | ICD-10-CM | POA: Diagnosis not present

## 2019-03-08 DIAGNOSIS — Z4789 Encounter for other orthopedic aftercare: Secondary | ICD-10-CM | POA: Diagnosis not present

## 2019-03-09 ENCOUNTER — Other Ambulatory Visit: Payer: Self-pay

## 2019-03-09 ENCOUNTER — Encounter: Payer: Self-pay | Admitting: Internal Medicine

## 2019-03-09 ENCOUNTER — Ambulatory Visit (INDEPENDENT_AMBULATORY_CARE_PROVIDER_SITE_OTHER): Payer: Medicare Other | Admitting: Internal Medicine

## 2019-03-09 VITALS — BP 139/82 | HR 80 | Temp 98.7°F | Ht 65.0 in | Wt 166.1 lb

## 2019-03-09 DIAGNOSIS — Z86711 Personal history of pulmonary embolism: Secondary | ICD-10-CM

## 2019-03-09 DIAGNOSIS — Z7901 Long term (current) use of anticoagulants: Secondary | ICD-10-CM | POA: Diagnosis not present

## 2019-03-09 DIAGNOSIS — Z978 Presence of other specified devices: Secondary | ICD-10-CM

## 2019-03-09 DIAGNOSIS — R05 Cough: Secondary | ICD-10-CM

## 2019-03-09 DIAGNOSIS — D539 Nutritional anemia, unspecified: Secondary | ICD-10-CM

## 2019-03-09 DIAGNOSIS — Z23 Encounter for immunization: Secondary | ICD-10-CM | POA: Diagnosis not present

## 2019-03-09 DIAGNOSIS — I10 Essential (primary) hypertension: Secondary | ICD-10-CM

## 2019-03-09 DIAGNOSIS — N641 Fat necrosis of breast: Secondary | ICD-10-CM

## 2019-03-09 DIAGNOSIS — D649 Anemia, unspecified: Secondary | ICD-10-CM | POA: Diagnosis not present

## 2019-03-09 DIAGNOSIS — L080 Pyoderma: Secondary | ICD-10-CM | POA: Diagnosis not present

## 2019-03-09 DIAGNOSIS — Z4801 Encounter for change or removal of surgical wound dressing: Secondary | ICD-10-CM | POA: Diagnosis not present

## 2019-03-09 DIAGNOSIS — Z4789 Encounter for other orthopedic aftercare: Secondary | ICD-10-CM | POA: Diagnosis not present

## 2019-03-09 NOTE — Progress Notes (Signed)
Internal Medicine Clinic Attending  I saw and evaluated the patient.  I personally confirmed the key portions of the history and exam documented by Dr. Basaraba and I reviewed pertinent patient test results.  The assessment, diagnosis, and plan were formulated together and I agree with the documentation in the resident's note.    

## 2019-03-09 NOTE — Patient Instructions (Signed)
It was nice seeing you today! Thank you for choosing [facility] for your Primary Care.    Today we talked about:   1. Rash: We think this is likely a viral infection and will go away on its own. However, we will still do some labs today to be sure. We have also sent a referral to the skin doctors. Please cancel that appointment if the rash heals.

## 2019-03-09 NOTE — Progress Notes (Signed)
   CC: Rash  HPI:  [name] is a 68 y.o. female with a PMHx of recent breast necrosis with wound vac in place, HTN, PE on Coumadin, who presents to the clinic for evaluation of a rash.   [name] reports the rash has been present for approximately 2 weeks and slightly worsened, by spreading further. Rash first started on the right upper arm and then spread to her belly, back, left thigh, and bilateral palms and feet. She describes the rash as a blister that opens and weeps but is non-bloody. It is very itchy and painful. The pain is described as sharp. Denies any new detergents, body wash, medication, or change in routine. Her plastic surgeon gave her Triamcinolone cream that alleviates the itchiness and pain but does not make the rash heal. She cannot recall every having a rash like this before. She denies fever, chills, nausea, vomiting, abdominal pain.   [name] endorses a mild intermittent cough that started around the same the as the rash. She has a granddaughter that lives with her at home, but denies any sick contacts. No sexual activity in 8-9 years and does not know of any prior STD infections, including syphilis.   Past Medical History:  Diagnosis Date  . Arthritis   . Asthma   . Breast cancer ([place]) 2001   Left Breast Cancer  . CHF (congestive heart failure) ([place])   . COPD (chronic obstructive pulmonary disease) ([place])   . Coronary artery disease   . History of mitral valve replacement with mechanical valve   . HLD (hyperlipidemia)   . Hypertension   . Pulmonary embolism ([place])    Review of Systems:  Review of Systems  Constitutional: Negative for chills, fever and malaise/fatigue.  Respiratory: Positive for cough. Negative for shortness of breath.   Gastrointestinal: Negative for abdominal pain, nausea and vomiting.  Musculoskeletal: Negative for joint pain and myalgias.  Skin: Positive for itching and rash.   Physical Exam:  Vitals:   03/09/19 0847  BP: 139/82   Pulse: 80  Temp: 98.7 F (37.1 C)  TempSrc: Oral  SpO2: 100%  Weight: 166 lb 1.6 oz (75.3 kg)  Height: 5\' 5"  (1.651 m)   Physical Exam Vitals signs and nursing note reviewed.  Constitutional:      General: She is not in acute distress.    Appearance: She is normal weight. She is not toxic-appearing.  Pulmonary:     Effort: Pulmonary effort is normal. No respiratory distress.  Skin:    General: Skin is warm and dry.     Findings: Rash and wound (Wound vac in place at left breast) present. No petechiae. Rash is pustular (Multiple 2-23mm rashes particularly concentrated on the bilateral palms, extending into the forearm.  Multiple open pustular lesions on the abdomen, back, and left thigh. See below for images.). Rash is not purpuric.  Neurological:     Mental Status: She is alert and oriented to person, place, and time. Mental status is at baseline.  Psychiatric:        Mood and Affect: Mood normal.        Behavior: Behavior normal.           Assessment & Plan:   See Encounters Tab for problem based charting.  Patient seen with Dr. Philipp Ovens

## 2019-03-09 NOTE — Assessment & Plan Note (Addendum)
Kim Anthony presents with 2 week history of a pustular rash that involvements bilateral palms and soles. It is widespread and includes the trunk. Initially presented on right arm though.   Given its pustular appearance, differential includes molluscum contagiosum. With her cough developing at the same time and exposure to children (grandchild lives with her), differential also includes hand, foot and mouth disease, or other viral syndrome. She has not been sexually active in many years, but given her recent hospitalization, could be re-activated secondary syphilis. Will check RPR and treponemal antibodies today as well.   She received Doxycycline after hospitalization for her left breast abscess and she finished the course after discharge. Further supports possible viral infection, as Doxycycline should have helped the rash as well.   Referral to dermatology sent in case this is non-resolving and she requires a biopsy. Instructed to return to clinic in 1-2 weeks for reassessment.  Plan:  - Continue Benadryl as needed for itching - Stop using Triamcinolone cream  - Referral sent to Derm  - RPR and Trep antibody result pending

## 2019-03-09 NOTE — Assessment & Plan Note (Signed)
Labs drawn for assessment of macrocytic anemia per PCP's instructions: B12 and folate. Results pending.   Plan:  - Will follow up lab results

## 2019-03-10 DIAGNOSIS — Z4801 Encounter for change or removal of surgical wound dressing: Secondary | ICD-10-CM | POA: Diagnosis not present

## 2019-03-10 DIAGNOSIS — Z4789 Encounter for other orthopedic aftercare: Secondary | ICD-10-CM | POA: Diagnosis not present

## 2019-03-10 LAB — FOLATE RBC
Folate, Hemolysate: 363 ng/mL
Folate, RBC: 976 ng/mL (ref >498)
Hematocrit: 37.2 % (ref 34.0–46.6)

## 2019-03-11 LAB — FLUORESCENT TREPONEMAL AB(FTA)-IGG-BLD: Fluorescent Treponemal Ab, IgG: NONREACTIVE

## 2019-03-11 LAB — VITAMIN B12: Vitamin B-12: 301 pg/mL (ref 232–1245)

## 2019-03-11 LAB — RPR: RPR Ser Ql: NONREACTIVE

## 2019-03-13 DIAGNOSIS — Z4789 Encounter for other orthopedic aftercare: Secondary | ICD-10-CM | POA: Diagnosis not present

## 2019-03-13 DIAGNOSIS — Z4801 Encounter for change or removal of surgical wound dressing: Secondary | ICD-10-CM | POA: Diagnosis not present

## 2019-03-13 NOTE — Progress Notes (Signed)
Subjective:     Patient ID: Kim Anthony, female    DOB: 1950/08/05, 68 y.o.   [name]: [id number]  Chief Complaint  Patient presents with  . Follow-up    2 weeks for (L) open wound of axillary region    HPI: The patient is a 68 y.o. female here for follow-up on her left chest wall wound.  Spoke with patient's daughter and home health nurse on 03/06/2019.  They reported a rash that developed over her entire body.  She denies any difficulty breathing, shortness of breath.  Since then she has visited her PCP who recommended continuing Benadryl as needed for itching and to stop triamcinolone steroid cream.  RPR and trep antibody tested and was negative.  Today she reports that she is going to see dermatologist on Thursday (2 days) to evaluate her rash.  She has not had any fever, chills, nausea, vomiting.  The rash is very itchy and is throughout her entire body.  It appears it is concentrated in the axillary fold of her left arm, with a large cluster of maculopapular lesions. She also has some dried crusting pustules throughout her body.   Her left chest wall wound has made significant improvement since last visit.  It looks as if she is beginning to epithelialize around the edges and the wound has decreased in size.  It is 2.5 cm x 12.8 cm and nearly flush with the skin.  On last exam it was 4 cm x 15 cm.    Other than the itching and pain she reports that she is doing pretty well.  Her pain has been difficult to deal with and keeps her up throughout the night.  She reports that yesterday she was in so much pain that she was unable to eat for most of the day.  She does not have any fevers, chills, nausea, vomiting.  Review of Systems  Constitutional: Negative for chills, diaphoresis, fever, malaise/fatigue and weight loss.  Respiratory: Negative.   Cardiovascular: Negative.   Gastrointestinal: Negative.   Genitourinary: Negative.   Musculoskeletal: Positive for myalgias.  Skin: Positive for  itching and rash.       + wound  Neurological: Negative for dizziness and headaches.  Psychiatric/Behavioral: Negative.      Objective:   Vital Signs BP 138/84 (BP Location: Right Arm, Patient Position: Sitting, Cuff Size: Normal)   Pulse 83   Temp 97.9 F (36.6 C) (Temporal)   Ht 5\' 5"  (1.651 m)   Wt 159 lb (72.1 kg)   SpO2 98%   BMI 26.46 kg/m  Vital Signs and Nursing Note Reviewed Chaperone present Physical Exam  Constitutional: She is oriented to person, place, and time and well-developed, well-nourished, and in no distress.  HENT:  Head: Normocephalic and atraumatic.  Cardiovascular: Normal rate.  Pulmonary/Chest: Effort normal.  Musculoskeletal:        General: Tenderness and deformity present.     Comments: Decreased ROM of L arm due to wound  Neurological: She is alert and oriented to person, place, and time. Gait normal.  Skin: Skin is warm and dry. Rash noted. Rash is maculopapular. She is not diaphoretic. No erythema. No pallor.     2.5 cm x 12.8 cm left chest wall wound. Good pink granulation tissue. No surrounding erythema. Surrounding skin lesions noted, concentrated at the L axillary region with satellite lesions throughout entire body. Appears to be crusting vesicles.  Psychiatric: Mood and affect normal.      Assessment/Plan:  ICD-10-CM   1. Open wound of left axillary region, subsequent encounter  S41.102D   2. Acquired absence of left breast  Z90.12     Overall Kim Anthony is making good improvements.  Donated ACell applied to the medial aspect of the wound to assist with granulation.  It appears that she is nearly granulated to the surface and has begun to epithelialize around the edges.  The wound has decreased in size since her last visit 2 weeks ago.  No sign of infection, seroma, hematoma.  She is still working with physical therapy which has been helpful and her range of motion has improved.  She is going to see dermatologist in 2 days for  evaluation of her full-body rash.  Unsure etiology, potentially from multiple different tapes and/or wound VAC transparent dressing.  On previous exams she had a rash on her right arm where Tegaderm was placed for IV in the hospital.  She also reports that today she noticed an additional breakout area on her right shoulder where she had a bandage placed after her flu shot.  The rash could potentially be caused by these different substrates in the tape material.  Follow-up in 2 weeks for reevaluation.  I think it would be best if patient avoids wound VAC at this time due to possible reaction to the transparent dressing.  Continue with Benadryl for itching.  Appreciate input from dermatology.  At the current rate she may not need a skin graft if she continues to epithelialize.  Pain medication sent to the pharmacy for pain control.  She understands to only use it when in severe pain and to take Tylenol and ibuprofen to bridge.  She understands to not take it sooner than every 6 hours as needed  Charlies Constable, PA-C 03/14/2019, 8:44 AM

## 2019-03-14 ENCOUNTER — Encounter: Payer: Self-pay | Admitting: Surgical

## 2019-03-14 ENCOUNTER — Other Ambulatory Visit: Payer: Self-pay

## 2019-03-14 ENCOUNTER — Ambulatory Visit (INDEPENDENT_AMBULATORY_CARE_PROVIDER_SITE_OTHER): Payer: Medicare Other | Admitting: Surgical

## 2019-03-14 VITALS — BP 138/84 | HR 83 | Temp 97.9°F | Ht 65.0 in | Wt 159.0 lb

## 2019-03-14 DIAGNOSIS — Z9012 Acquired absence of left breast and nipple: Secondary | ICD-10-CM

## 2019-03-14 DIAGNOSIS — S41102D Unspecified open wound of left upper arm, subsequent encounter: Secondary | ICD-10-CM

## 2019-03-14 LAB — ACID FAST CULTURE WITH REFLEXED SENSITIVITIES (MYCOBACTERIA): Acid Fast Culture: NEGATIVE

## 2019-03-14 MED ORDER — OXYCODONE-ACETAMINOPHEN 5-325 MG PO TABS: 1.0000 | ORAL_TABLET | Freq: Four times a day (QID) | ORAL | 0 refills | Status: AC | PRN

## 2019-03-15 DIAGNOSIS — Z4789 Encounter for other orthopedic aftercare: Secondary | ICD-10-CM | POA: Diagnosis not present

## 2019-03-15 DIAGNOSIS — Z4801 Encounter for change or removal of surgical wound dressing: Secondary | ICD-10-CM | POA: Diagnosis not present

## 2019-03-16 ENCOUNTER — Telehealth: Payer: Self-pay

## 2019-03-16 DIAGNOSIS — L0889 Other specified local infections of the skin and subcutaneous tissue: Secondary | ICD-10-CM | POA: Diagnosis not present

## 2019-03-16 DIAGNOSIS — Z4801 Encounter for change or removal of surgical wound dressing: Secondary | ICD-10-CM | POA: Diagnosis not present

## 2019-03-16 DIAGNOSIS — Z4789 Encounter for other orthopedic aftercare: Secondary | ICD-10-CM | POA: Diagnosis not present

## 2019-03-16 DIAGNOSIS — L239 Allergic contact dermatitis, unspecified cause: Secondary | ICD-10-CM | POA: Diagnosis not present

## 2019-03-16 NOTE — Telephone Encounter (Signed)
Kim Anthony with [facility] and informed her I informed [name],PA of the above message.  He would like for her to continue what she is doing except the wound vac.  She verbalized understanding and agreed.  She said she's using ABD pads,gauze,KY gel with adaptic now using vaseline gauze.  And she's seeing the patient (2x/w).//AB/CMA

## 2019-03-16 NOTE — Telephone Encounter (Signed)
Received call from [place] with [facility]. She wanted to let us know that Ms. Kurman saw her dermatologist who advised to not use wound vac at this time. She will follow up in 2 weeks for reevaluation. Olivia Mackie would like to know what to do in terms of wound care. Contact: [phone].

## 2019-03-20 DIAGNOSIS — Z4789 Encounter for other orthopedic aftercare: Secondary | ICD-10-CM | POA: Diagnosis not present

## 2019-03-20 DIAGNOSIS — Z4801 Encounter for change or removal of surgical wound dressing: Secondary | ICD-10-CM | POA: Diagnosis not present

## 2019-03-23 ENCOUNTER — Telehealth: Payer: Self-pay | Admitting: *Deleted

## 2019-03-23 NOTE — Telephone Encounter (Signed)
Received fax from [facility] self testing service.  9/29 INR - 2.5

## 2019-03-24 ENCOUNTER — Telehealth: Payer: Self-pay | Admitting: Plastic Surgery

## 2019-03-24 DIAGNOSIS — Z4801 Encounter for change or removal of surgical wound dressing: Secondary | ICD-10-CM | POA: Diagnosis not present

## 2019-03-24 DIAGNOSIS — Z4789 Encounter for other orthopedic aftercare: Secondary | ICD-10-CM | POA: Diagnosis not present

## 2019-03-24 NOTE — Telephone Encounter (Signed)
OK. I have texted the patient's daughter, Kim Anthony who indicates she did perform a patient self-testing, point-of-care, finger-stick INR on 21-Mar-2019 and she (daughter) failed to text ME on that day as she had been doing. The value was reported to me. The value of 2.5 (target goal 2.5 - 3.5) was on 45mg  warfarin/wk. I have increased to 50mg  warfarin/wk and advised the daughter to repeat PST FS POC INR on 10-Apr-2019.
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
## 2019-03-27 ENCOUNTER — Telehealth: Payer: Self-pay

## 2019-03-27 DIAGNOSIS — L239 Allergic contact dermatitis, unspecified cause: Secondary | ICD-10-CM | POA: Diagnosis not present

## 2019-03-27 DIAGNOSIS — L308 Other specified dermatitis: Secondary | ICD-10-CM | POA: Diagnosis not present

## 2019-03-27 NOTE — Telephone Encounter (Signed)
[name] from [facility] called wanting to inform us that the patient has a missed nursing visit this week as she has other doctors appointments to attend.

## 2019-03-28 ENCOUNTER — Ambulatory Visit (INDEPENDENT_AMBULATORY_CARE_PROVIDER_SITE_OTHER): Payer: Medicare Other | Admitting: Surgical

## 2019-03-28 ENCOUNTER — Other Ambulatory Visit: Payer: Self-pay

## 2019-03-28 ENCOUNTER — Encounter: Payer: Self-pay | Admitting: Surgical

## 2019-03-28 VITALS — BP 165/101 | HR 87 | Temp 97.3°F | Ht 65.0 in | Wt 163.5 lb

## 2019-03-28 DIAGNOSIS — Z9012 Acquired absence of left breast and nipple: Secondary | ICD-10-CM

## 2019-03-28 DIAGNOSIS — S41102D Unspecified open wound of left upper arm, subsequent encounter: Secondary | ICD-10-CM

## 2019-03-28 NOTE — Progress Notes (Signed)
Subjective:     Patient ID: Kim Anthony, female    DOB: 06-19-1951, 67 y.o.   [name]: [id number]  Chief Complaint  Patient presents with  . Follow-up    2 weeks for open wound (L) axillary region    HPI: The patient is a 68 y.o. female here for follow-up on left chest wall wound.  At her last visit, 03/14/19, her left chest wall wound was 2.5 cm x 12.8 cm and nearly flush with the skin. Donated Acell had been applied to the medial aspect of her wound.   Today her wound is 1.5 cm x 6 cm x + 0.1 cm. She has some hypergranulation at the midline medial aspect. It is good granulation tissue. Since her last visit, wound has decreased by half and she has epithelialized the entire lateral aspect of the wound.   She has been doing really well. Her arm and breast pain has significantly improved. She is working with PT. She has been doing daily dressing changed and also receiving assistance from home health with dressing changes 2x weekly. No fever, chills, n/v.  She still has the rash and saw dermatology again yesterday who recommended trying vinegar on the areas of rash per patient. She has not started this yet. The rash is throughout her entire body, concentrated to the palms, soles of her feet. She does not recall what dermatology has diagnosed it as.  Review of Systems  Constitutional: Negative for chills, diaphoresis, fever, malaise/fatigue and weight loss.  Respiratory: Negative.   Cardiovascular: Negative.   Gastrointestinal: Negative for abdominal pain, nausea and vomiting.  Musculoskeletal: Positive for myalgias (breast pain).  Skin: Positive for itching and rash.  Neurological: Positive for sensory change.     Objective:   Vital Signs BP (!) 165/101 (BP Location: Right Arm, Patient Position: Sitting, Cuff Size: Normal)   Pulse 87   Temp (!) 97.3 F (36.3 C) (Temporal)   Ht 5\' 5"  (1.651 m)   Wt 163 lb 8 oz (74.2 kg)   SpO2 99%   BMI 27.21 kg/m  Vital Signs and Nursing Note  Reviewed Chaperone present Physical Exam  Constitutional: She is oriented to person, place, and time and well-developed, well-nourished, and in no distress.  HENT:  Head: Normocephalic and atraumatic.  Cardiovascular: Normal rate.  Pulmonary/Chest: Effort normal.    Musculoskeletal:        General: No edema.     Comments: Decreased ROM left shoulder   Neurological: She is alert and oriented to person, place, and time. Gait normal.  Skin: Skin is dry. Rash noted. She is not diaphoretic.  Maculopapular rash with pustules concentrated on hands and feet.   Psychiatric: Mood and affect normal.      Assessment/Plan:     ICD-10-CM   1. Acquired absence of left breast  Z90.12   2. Open wound of left axillary region, subsequent encounter  S41.102D     Continue with daily KY jelly, adaptic, ABD dressing. Avoid tape as potential allergy.  Dermatology recommends vinegar per patient for rash.   Follow up in 2 weeks. At that time, may need to try collagen + pressure dressing or silver nitrate for the hypergranulation.  Pictures were obtained of the patient and placed in the chart with the patient's or guardian's permission.   Kermit Balo Caven Perine, PA-C 03/28/2019, 9:14 AM

## 2019-03-29 ENCOUNTER — Telehealth: Payer: Self-pay | Admitting: Pharmacist

## 2019-03-29 ENCOUNTER — Telehealth: Payer: Self-pay | Admitting: *Deleted

## 2019-03-29 DIAGNOSIS — Z4801 Encounter for change or removal of surgical wound dressing: Secondary | ICD-10-CM | POA: Diagnosis not present

## 2019-03-29 DIAGNOSIS — Z4789 Encounter for other orthopedic aftercare: Secondary | ICD-10-CM | POA: Diagnosis not present

## 2019-03-29 NOTE — Telephone Encounter (Deleted)
[place] office received call about pt's out of range INR; but hung up before I could talk to them. I called pt's daughter,Monica, who stated pt's INR yesterday was 5.7 and she had texted [name] who gave them instructions.

## 2019-03-29 NOTE — Telephone Encounter (Signed)
Was called by the patient's daughter at 1812h 28-Mar-2019 reporting PST FS POC INR of 5.6 on 50mg  warfarin/wk. This was five days after the most recent INR documented on 2-OCT which, was 2.5 (goal 2.5 - 3.5) at that time, dose was increased from 45mg /wk warfarin to 50mg /wk warfarin. During the ensuing time, she was placed upon doxycycline by a dermatologist for a "tape rash"--described as "blistering" by the daughter. The patient had GI intolerance to doxycyline after 2-3 doses and she discontinued. She has subsequently (started 28-Mar-2019) been started on cephalexin 500mg  BID x 7 days the daughter states. Suspect DDI interaction or impact upon enterohepatic recirculation accounting for the hypoprothrombinemia.Have OMITTED times TWO DOSES (6/29-Mar-2019) and will resume at 5mg  alternating with 7.5mg  every other day, repeat INR on Monday 03-Apr-2019. No bleeding symptoms or signs endorsed by the daughter or patient.

## 2019-03-29 NOTE — Telephone Encounter (Signed)
This am received fax from [facility] of pt's INR 5.7 from 10/6. See Dr Gladstone Pih phone encounter note.

## 2019-03-29 NOTE — Telephone Encounter (Signed)
I agree

## 2019-03-29 NOTE — Telephone Encounter (Signed)
[name] office received call about pt's out of range INR; but hung up before I could talk to them. I called pt's daughter,Kim Anthony, who stated pt's INR yesterday was 5.7 and she had texted [name] who gave them instructions.

## 2019-03-30 DIAGNOSIS — Z4801 Encounter for change or removal of surgical wound dressing: Secondary | ICD-10-CM | POA: Diagnosis not present

## 2019-03-30 DIAGNOSIS — Z4789 Encounter for other orthopedic aftercare: Secondary | ICD-10-CM | POA: Diagnosis not present

## 2019-04-03 DIAGNOSIS — Z7901 Long term (current) use of anticoagulants: Secondary | ICD-10-CM | POA: Diagnosis not present

## 2019-04-03 DIAGNOSIS — I2782 Chronic pulmonary embolism: Secondary | ICD-10-CM | POA: Diagnosis not present

## 2019-04-03 DIAGNOSIS — Z952 Presence of prosthetic heart valve: Secondary | ICD-10-CM | POA: Diagnosis not present

## 2019-04-03 DIAGNOSIS — Z4801 Encounter for change or removal of surgical wound dressing: Secondary | ICD-10-CM | POA: Diagnosis not present

## 2019-04-03 DIAGNOSIS — Z4789 Encounter for other orthopedic aftercare: Secondary | ICD-10-CM | POA: Diagnosis not present

## 2019-04-06 DIAGNOSIS — Z4789 Encounter for other orthopedic aftercare: Secondary | ICD-10-CM | POA: Diagnosis not present

## 2019-04-06 DIAGNOSIS — Z4801 Encounter for change or removal of surgical wound dressing: Secondary | ICD-10-CM | POA: Diagnosis not present

## 2019-04-07 ENCOUNTER — Other Ambulatory Visit: Payer: Self-pay | Admitting: *Deleted

## 2019-04-09 MED ORDER — METOPROLOL SUCCINATE ER 25 MG PO TB24: ORAL_TABLET | ORAL | 0 refills | Status: DC

## 2019-04-10 DIAGNOSIS — Z4801 Encounter for change or removal of surgical wound dressing: Secondary | ICD-10-CM | POA: Diagnosis not present

## 2019-04-10 DIAGNOSIS — Z4789 Encounter for other orthopedic aftercare: Secondary | ICD-10-CM | POA: Diagnosis not present

## 2019-04-11 ENCOUNTER — Ambulatory Visit (INDEPENDENT_AMBULATORY_CARE_PROVIDER_SITE_OTHER): Payer: Medicare Other | Admitting: Surgical

## 2019-04-11 ENCOUNTER — Telehealth: Payer: Self-pay | Admitting: Pharmacist

## 2019-04-11 ENCOUNTER — Ambulatory Visit: Payer: Medicare Other | Admitting: Surgical

## 2019-04-11 ENCOUNTER — Other Ambulatory Visit: Payer: Self-pay

## 2019-04-11 ENCOUNTER — Encounter: Payer: Self-pay | Admitting: Surgical

## 2019-04-11 VITALS — BP 144/83 | HR 77 | Temp 97.5°F | Ht 65.0 in | Wt 156.8 lb

## 2019-04-11 DIAGNOSIS — Z9012 Acquired absence of left breast and nipple: Secondary | ICD-10-CM

## 2019-04-11 DIAGNOSIS — S41102D Unspecified open wound of left upper arm, subsequent encounter: Secondary | ICD-10-CM

## 2019-04-11 MED ORDER — OXYCODONE-ACETAMINOPHEN 5-325 MG PO TABS: 1.0000 | ORAL_TABLET | Freq: Three times a day (TID) | ORAL | 0 refills | Status: AC | PRN

## 2019-04-11 NOTE — Addendum Note (Signed)
Addended byRoetta Sessions on: 04/11/2019 02:38 PM   Modules accepted: Orders

## 2019-04-11 NOTE — Telephone Encounter (Signed)
Patient's daughter was provided new instructions based upon a PST FS POC INR value of 3.8 (goal 2.5 -3.5) on 45mg  warfarin/wk, REDUCED to 42.5mg  warfarin/wk. Repeat INR in 6 days.

## 2019-04-11 NOTE — Progress Notes (Signed)
Subjective:     Patient ID: Kim Anthony, female    DOB: 12-23-1950, 68 y.o.   [name]: [id number]  Chief Complaint  Patient presents with  . Follow-up    2 weeks on (L) chest wall wound    HPI: The patient is a 68 y.o. female here for follow-up on left chest wall wound.  Wound at last visit was 1.5 x 6 x + 0.1 cm.  Today on exam, her wound has significantly improved. She has two small areas of granulation tissue. The medial wound is 1 x 1 cm and flush with skin and the lateral wound is 0.25 x 0.25 cm and flush with skin. She has good new pink epithelial tissue surrounding the area. She also has some contracture of the wound.   She had PT in the past, but reports PT did not want to treat due to the wound.  Today she notes that she had some left chest wall pain that started Sunday. Denies any fever, chills, n/v. Denies dizziness, radiation to arm, fatigue. Describes the pain as stabbing and reports it is worse with palpation. It is also occurring near her L latissimus flap incision. The pain has been keeping her up at night for the past few days. Denies shortness of breath, palpitations, light-headedness, dizziness, headache. Pain is minimally relieved with APAP/ibuprofen.  She reports her rash has been improving.  Review of Systems  Constitutional: Positive for activity change. Negative for appetite change, chills, diaphoresis, fatigue and fever.  Respiratory: Negative for shortness of breath.   Cardiovascular: Negative for chest pain, palpitations and leg swelling.  Genitourinary: Negative.   Musculoskeletal: Positive for myalgias.  Skin: Positive for rash (improving) and wound. Negative for color change and pallor.  Neurological: Negative for dizziness, tremors, syncope, weakness, light-headedness and headaches.    Objective:   Vital Signs BP (!) 144/83 (BP Location: Right Arm, Patient Position: Sitting, Cuff Size: Normal)   Pulse 77   Temp (!) 97.5 F (36.4 C) (Temporal)   Ht 5'  5" (1.651 m)   Wt 156 lb 12.8 oz (71.1 kg)   SpO2 99%   BMI 26.09 kg/m  Vital Signs and Nursing Note Reviewed Chaperone present Physical Exam  Constitutional: She is oriented to person, place, and time and well-developed, well-nourished, and in no distress.  HENT:  Head: Normocephalic and atraumatic.  Cardiovascular: Normal rate and intact distal pulses.  Pulmonary/Chest: Effort normal. No respiratory distress.    Musculoskeletal: Normal range of motion.  Neurological: She is alert and oriented to person, place, and time. Gait normal.  Skin: Skin is warm and dry. No rash noted. She is not diaphoretic. No erythema. No pallor.  Psychiatric: Mood and affect normal.    Assessment/Plan:     ICD-10-CM   1. Acquired absence of left breast  Z90.12   2. Open wound of left axillary region, subsequent encounter  S41.102D     Mrs. Graben is healing really well. She continues to have intermittent left chest wall pain that is keeping her up at night. It is worse with palpation and she reports it has worsened over the past few days, but has not changed in nature. She continues to have assistance from her daughter whom she lives with and home health.  Prescription sent to pharmacy for severe pain in her left chest wall wound. She knows to supplement with ibuprofen and tylenol during the day and to only take prescribed medication as needed for severe pain.  She understands that  if she develops new pain that feels unrelated to her L chest wall wound to seek medical attention.   Follow up in 3 weeks. Re-sent order for eval and treat.  Kermit Balo Felder Lebeda, PA-C 04/11/2019, 2:30 PM

## 2019-04-12 ENCOUNTER — Telehealth: Payer: Self-pay | Admitting: *Deleted

## 2019-04-12 NOTE — Telephone Encounter (Addendum)
Received a fax on (04/06/19) from [facility] (Missed visit report-for review) notifying us of a missed visit with the patient.  Dr. Marla Roe notified.//AB/CMA

## 2019-04-13 ENCOUNTER — Telehealth: Payer: Self-pay | Admitting: Surgical

## 2019-04-13 DIAGNOSIS — Z4801 Encounter for change or removal of surgical wound dressing: Secondary | ICD-10-CM | POA: Diagnosis not present

## 2019-04-13 DIAGNOSIS — Z4789 Encounter for other orthopedic aftercare: Secondary | ICD-10-CM | POA: Diagnosis not present

## 2019-04-13 NOTE — Addendum Note (Signed)
Addended by: Harl Bowie on: 04/13/2019 11:14 AM   Modules accepted: Orders

## 2019-04-13 NOTE — Telephone Encounter (Signed)
Kim Anthony called on behalf of Kim Anthony regarding PT order sent by [facility]. Order should be sent to [facility] because [name] is unable to drive and her daughter is back at work. Per Janace Hoard, we will discuss with [facility] and have a new order sent to [facility]. Fax # for [facility] is [phone].

## 2019-04-13 NOTE — Addendum Note (Signed)
Addended by: Harl Bowie on: 04/13/2019 09:10 AM   Modules accepted: Orders

## 2019-04-17 DIAGNOSIS — Z4801 Encounter for change or removal of surgical wound dressing: Secondary | ICD-10-CM | POA: Diagnosis not present

## 2019-04-17 DIAGNOSIS — Z4789 Encounter for other orthopedic aftercare: Secondary | ICD-10-CM | POA: Diagnosis not present

## 2019-04-20 DIAGNOSIS — Z4801 Encounter for change or removal of surgical wound dressing: Secondary | ICD-10-CM | POA: Diagnosis not present

## 2019-04-20 DIAGNOSIS — Z4789 Encounter for other orthopedic aftercare: Secondary | ICD-10-CM | POA: Diagnosis not present

## 2019-04-24 DIAGNOSIS — Z4801 Encounter for change or removal of surgical wound dressing: Secondary | ICD-10-CM | POA: Diagnosis not present

## 2019-04-24 DIAGNOSIS — Z4789 Encounter for other orthopedic aftercare: Secondary | ICD-10-CM | POA: Diagnosis not present

## 2019-04-25 DIAGNOSIS — Z4801 Encounter for change or removal of surgical wound dressing: Secondary | ICD-10-CM | POA: Diagnosis not present

## 2019-04-25 DIAGNOSIS — Z4789 Encounter for other orthopedic aftercare: Secondary | ICD-10-CM | POA: Diagnosis not present

## 2019-04-28 DIAGNOSIS — Z4789 Encounter for other orthopedic aftercare: Secondary | ICD-10-CM | POA: Diagnosis not present

## 2019-04-28 DIAGNOSIS — Z4801 Encounter for change or removal of surgical wound dressing: Secondary | ICD-10-CM | POA: Diagnosis not present

## 2019-05-01 ENCOUNTER — Telehealth: Payer: Self-pay | Admitting: Pharmacist

## 2019-05-01 DIAGNOSIS — Z4801 Encounter for change or removal of surgical wound dressing: Secondary | ICD-10-CM | POA: Diagnosis not present

## 2019-05-01 DIAGNOSIS — I2782 Chronic pulmonary embolism: Secondary | ICD-10-CM | POA: Diagnosis not present

## 2019-05-01 DIAGNOSIS — Z4789 Encounter for other orthopedic aftercare: Secondary | ICD-10-CM | POA: Diagnosis not present

## 2019-05-01 DIAGNOSIS — Z952 Presence of prosthetic heart valve: Secondary | ICD-10-CM | POA: Diagnosis not present

## 2019-05-01 DIAGNOSIS — Z7901 Long term (current) use of anticoagulants: Secondary | ICD-10-CM | POA: Diagnosis not present

## 2019-05-01 NOTE — Telephone Encounter (Signed)
Patient's daughter called me with PST FS POC INR = 5.3 on 35mg  warfarin/wk. OMIT today's dose; decrease to 30mg  over remaining six days. Repeat INR in one week 08-May-2019.

## 2019-05-02 ENCOUNTER — Ambulatory Visit (INDEPENDENT_AMBULATORY_CARE_PROVIDER_SITE_OTHER): Payer: Medicare Other | Admitting: Surgical

## 2019-05-02 ENCOUNTER — Encounter: Payer: Self-pay | Admitting: Surgical

## 2019-05-02 ENCOUNTER — Other Ambulatory Visit: Payer: Self-pay

## 2019-05-02 ENCOUNTER — Telehealth: Payer: Self-pay | Admitting: *Deleted

## 2019-05-02 VITALS — BP 161/89 | HR 81 | Temp 97.8°F | Ht 65.0 in | Wt 157.2 lb

## 2019-05-02 DIAGNOSIS — S21102A Unspecified open wound of left front wall of thorax without penetration into thoracic cavity, initial encounter: Secondary | ICD-10-CM

## 2019-05-02 DIAGNOSIS — M25512 Pain in left shoulder: Secondary | ICD-10-CM

## 2019-05-02 DIAGNOSIS — Z9012 Acquired absence of left breast and nipple: Secondary | ICD-10-CM

## 2019-05-02 MED ORDER — DICLOFENAC SODIUM 50 MG PO TBEC: 50.0000 mg | DELAYED_RELEASE_TABLET | Freq: Two times a day (BID) | ORAL | 0 refills | Status: DC

## 2019-05-02 MED ORDER — DIAZEPAM 2 MG PO TABS: 2.0000 mg | ORAL_TABLET | Freq: Two times a day (BID) | ORAL | 0 refills | Status: DC | PRN

## 2019-05-02 NOTE — Progress Notes (Signed)
Subjective:     Patient ID: Kim Anthony, female    DOB: 1950-12-16, 68 y.o.   [name]: [id number]  Chief Complaint  Patient presents with  . Follow-up    3 weeks    HPI: The patient is a 68 y.o. female here for follow-up on left chest wall wound s/p latissimus flap. Kim Anthony reports Kim Anthony has been doing okay, still having a lot of pain and tightness in her left arm/upper chest which radiates to shoulder.  Kim Anthony reports PT came to her house, but they were unable to tx her for L arm tightness due to the provider being a lower extremity PT/PTA. Unsure of the exact details, but will re-send order.  In regards to her L chest wall wound, Kim Anthony has completely epithelialized the wound with fresh pink epithelium noted at the center. Kim Anthony has a small 1 cm blister at the midline of her transverse incision. No surrounding erythema noted. Mild swelling of left lower lateral area. No sign of infection or hematoma. Kim Anthony has TTP along the entire aspect of her upper left chest and it radiates into her L shoulder and into the L axilla on palpation - following track where latissimus flap enters chest wall.  Rash on her hands is slowly resolving. Still itching. No new lesions noted.  Denies fever, chills, n/v. Pain is worse at night and has been waking her up.   Review of Systems  Constitutional: Positive for activity change. Negative for appetite change, chills, diaphoresis and fever.  Respiratory: Negative for shortness of breath and wheezing.   Cardiovascular: Negative for chest pain, palpitations and leg swelling.  Gastrointestinal: Negative for abdominal pain, diarrhea, nausea and vomiting.  Musculoskeletal: Positive for arthralgias and myalgias.  Skin: Positive for rash and wound.  Neurological: Positive for weakness. Negative for dizziness and headaches.   Objective:   Vital Signs BP (!) 161/89 (BP Location: Right Arm, Patient Position: Sitting, Cuff Size: Normal)   Pulse 81   Temp 97.8 F (36.6 C)  (Temporal)   Ht 5\' 5"  (1.651 m)   Wt 157 lb 3.2 oz (71.3 kg)   SpO2 92%   BMI 26.16 kg/m  Vital Signs and Nursing Note Reviewed Chaperone present Physical Exam  Constitutional: Kim Anthony is oriented to person, place, and time and well-developed, well-nourished, and in no distress.  HENT:  Head: Normocephalic and atraumatic.  Eyes: Pupils are equal, round, and reactive to light.  Neck: Normal range of motion.  Cardiovascular: Normal rate and regular rhythm.  Pulmonary/Chest: Effort normal.    1 cm blister noted midline transverse left chest wall incision. Wound is well healed other than small blister noted. New epithelial tissue is beginning to pigment. No erythema, swelling, drainage noted.   Abdominal: Soft.  Musculoskeletal:        General: Tenderness and edema present.     Left shoulder: Kim Anthony exhibits decreased range of motion, tenderness, pain and decreased strength. Kim Anthony exhibits normal pulse.  Neurological: Kim Anthony is alert and oriented to person, place, and time. Gait normal.  Skin: Skin is warm and dry. Rash noted. Kim Anthony is not diaphoretic. No erythema. No pallor.  Psychiatric: Mood, affect and judgment normal.    Assessment/Plan:     ICD-10-CM   1. Acquired absence of left breast  Z90.12   2. Open chest wound, left, initial encounter  S21.102A     Prescription for valium sent to pharmacy to assist with relaxation of L pectoralis muscle and L latissimus muscle for relief of tightness.  Patient is not driving at this time due to pain and understands and agrees to not drive while taking valium or while in pain.  Rx for pain control with diclofenac for 5 days sent to pharmacy.  Referral resent to home health PT for assistance with PT eval and treat of L shoulder tightness.  Referral to ortho for consult in regards to L shoulder pain for further evaluation of causes unrelated to L chest wall wound and latissimus flap.  Apply Vaseline daily to L chest wall - Kim Anthony is healing well. No  open wounds at this time.  Follow up in 3 weeks, call with questions or concerns.   Kermit Balo Chanah Tidmore, PA-C 05/02/2019, 10:14 AM

## 2019-05-02 NOTE — Telephone Encounter (Signed)
ABNORMAL INR  CRITICAL DONE 11/09 INR   5.3 Sending to dr groce and dr Marianna Payment

## 2019-05-02 NOTE — Telephone Encounter (Signed)
Patient's daughter had provided me her patient self-testing, finger-stick, point of care INR results yesterday. Daughter/patient were advised of the plan:  OMIT dose on Monday 01-May-2019. Subsequent dosing instructions were provided and documented in [facility] as a phone note (which see). Thank you!

## 2019-05-02 NOTE — Telephone Encounter (Signed)
Agree with plan. Thank you

## 2019-05-03 NOTE — Telephone Encounter (Signed)
New order for PT will be sent to [facility].//AB/CMA

## 2019-05-04 ENCOUNTER — Telehealth: Payer: Self-pay | Admitting: Plastic Surgery

## 2019-05-04 NOTE — Telephone Encounter (Signed)
Called and spoke with Lattie Haw from [facility].  She reported that Mrs. Yarwood today was in a lot of pain along the left shoulder/chest wall area and there was some drainage from the incision.  She said it appeared to be yellow pus, but she was unsure.  I provided her with verbal OT orders for evaluation and treatment of her left shoulder/chest.  After: Lattie Haw, I called Mrs. Fisk and checked on her.  She reports that she is having a lot of pain on the left side, worse than it was when she saw me on the 10th.  Denies any fever, chills, nausea, vomiting.  She was unable to pick up her prescription for diclofenac because the pharmacy was unsure of the instructions.  She is taking Valium which has provided minimal relief.  She is unsure if the area is more red than it was yesterday.  There is some drainage and a little bit of blood draining from the area.  I informed her to take Tylenol, Valium tonight, have her neighbor bring her to the office tomorrow at 1120 for evaluation.  She knows to call if she develops any fever, chills, nausea, vomiting, significant pain.  Reasons to go to the ED provided.  She understood and all of her questions were answered.

## 2019-05-04 NOTE — Telephone Encounter (Signed)
Lattie Haw from [facility] called to get verbal OT orders for Kim Anthony. She also mentioned that she had to call a nurse out because Mrs. Schwend was complaining of pain at the incision site (level 8 out of 10) and there appears to be yellow pus coming from the incision. Lattie Haw can be called back at [phone].

## 2019-05-05 ENCOUNTER — Telehealth: Payer: Self-pay | Admitting: *Deleted

## 2019-05-05 ENCOUNTER — Other Ambulatory Visit: Payer: Self-pay

## 2019-05-05 ENCOUNTER — Ambulatory Visit (INDEPENDENT_AMBULATORY_CARE_PROVIDER_SITE_OTHER): Payer: Medicare Other | Admitting: Surgical

## 2019-05-05 ENCOUNTER — Encounter: Payer: Self-pay | Admitting: Surgical

## 2019-05-05 VITALS — BP 155/91 | HR 81 | Temp 97.5°F | Ht 65.0 in | Wt 157.0 lb

## 2019-05-05 DIAGNOSIS — Z9012 Acquired absence of left breast and nipple: Secondary | ICD-10-CM

## 2019-05-05 DIAGNOSIS — R0789 Other chest pain: Secondary | ICD-10-CM

## 2019-05-05 MED ORDER — HYDROCODONE-ACETAMINOPHEN 5-325 MG PO TABS: 1.0000 | ORAL_TABLET | Freq: Four times a day (QID) | ORAL | 0 refills | Status: AC | PRN

## 2019-05-05 NOTE — Telephone Encounter (Signed)
Physician Order signed and faxed to [facility].  Confirmation received.//AB/CMA

## 2019-05-05 NOTE — Progress Notes (Signed)
Subjective:     Patient ID: Kim Anthony, female    DOB: 22-Feb-1951, 68 y.o.   [name]: [id number]  Chief Complaint  Patient presents with  . Follow-up    on (L) breast    HPI: The patient is a 68 y.o. female here for follow-up on her left chest wall wound status post latissimus flap complicated by skin breakdown and necrosis.  She has been doing really well and incorporated all of the ACell and has epithelialized the entire portion of her left chest wall wound.  She has been having a lot of pain postoperatively and is now 3 months postop and still having significant left shoulder and chest wall pain.  The pain is not associated with any shortness of breath, fatigue, weakness, dizziness, headache it is associated mostly with movement of her left arm and any type of palpation.  There is no overlying erythema, no drainage, no sign of infection at this time.  I spoke with Kim Anthony from [facility] home health, occupational therapy and she reported that she saw Kim Anthony on 05/04/2019 and she was in a lot of pain and she noticed some drainage from her left chest wall incision.  Today Kim Anthony reports that she is in a lot of pain and she has been unable to sleep.  She reports the pain is mostly with palpation and movement of her left arm/shoulder and palpation of the left chest wall.  On exam there is a small area inferiorly near the inframammary fold that is compressible with possible fluid accumulation, but no fluctuance noted.  She is healing well and there is only a small area along the transverse incision with minimal drainage.  There is no sign of purulence, infection at this time.  She has been taking the Valium at night and it has been minimally helpful.  Review of Systems  Constitutional: Positive for activity change. Negative for appetite change, chills, diaphoresis, fatigue and fever.  Respiratory: Negative.   Cardiovascular: Positive for chest pain (Chest wall). Negative for palpitations and leg  swelling.  Gastrointestinal: Negative.   Genitourinary: Negative.   Musculoskeletal: Positive for arthralgias and myalgias.  Skin: Positive for rash (Hands, improving) and wound. Negative for color change.  Neurological: Negative.   Psychiatric/Behavioral: Negative.      Objective:   Vital Signs BP (!) 155/91 (BP Location: Right Arm, Patient Position: Sitting, Cuff Size: Normal)   Pulse 81   Temp (!) 97.5 F (36.4 C) (Temporal)   Ht 5\' 5"  (1.651 m)   Wt 157 lb (71.2 kg)   SpO2 98%   BMI 26.13 kg/m  Vital Signs and Nursing Note Reviewed Chaperone present Physical Exam  Constitutional: She is oriented to person, place, and time and well-developed, well-nourished, and in no distress.  HENT:  Head: Normocephalic and atraumatic.  Cardiovascular: Normal rate.  Pulmonary/Chest: Effort normal.  Musculoskeletal:        General: Tenderness (Left chest wall, left anterior shoulder) and deformity present.     Left shoulder: She exhibits decreased range of motion and tenderness. She exhibits no swelling, no effusion and normal pulse.       Arms:     Comments: Decreased range of motion of left shoulder.  Tenderness to palpation along the anterior shoulder, tenderness to palpation of anterior chest.  She reports pain in her low axillary region radiating to her back with palpation of the area superior to her transverse breast incision, tracking where her latissimus flap is communicating from her left  upper back to her left chest.  Neurological: She is alert and oriented to person, place, and time. Gait normal.  Skin: Skin is warm and dry. Rash noted. She is not diaphoretic. No erythema. No pallor.  Psychiatric: Mood and affect normal.    Assessment/Plan:     ICD-10-CM   1. Acquired absence of left breast  Z90.12    Kim Anthony is 3 months postop from placement of ACell over her left chest wall after necrosis of latissimus flap.  Unsure etiology of her left anterior chest wall and left  shoulder/axillary pain.  Her left chest wall wound is healing really well there is no sign of infection, seroma, hematoma.  There was a slight area of possible seroma at the inferior lateral aspect of her transverse incision but this was aspirated and there was no fluid obtained.  Referral to orthopedics previously sent for evaluation of left shoulder. Referral for pain management sent as patient's pain is now chronic and unlikely associated with postoperative changes - appreciate assistance  She is currently receiving OT treatment with assistance improving range of motion.  Norco prescription for 3 days sent to pharmacy for pain control.  Ultrasound ordered for evaluation of left anterior chest.  Unable to aspirate any fluid today using a sterile technique with Dr. Ulice Bold.  Chest x-ray AP and lateral ordered for evaluation of bony abnormalities in the left chest wall.  Patient is scheduled for follow-up in 2 weeks.  She knows to call with any questions or concerns prior to then.  Avoid taking Valium with Norco.  She understands.  Will call patient next week and check in.  Kermit Balo Danese Dorsainvil, PA-C 05/05/2019, 12:38 PM

## 2019-05-05 NOTE — Telephone Encounter (Addendum)
Received Physician Order from [facility] via of fax.  Requesting signature and return.  Given to provider to sign.//AB/CMA

## 2019-05-09 ENCOUNTER — Telehealth: Payer: Self-pay | Admitting: Pharmacist

## 2019-05-09 NOTE — Telephone Encounter (Signed)
Was called by the patient's daughter at 1917h 08-May-2019 and provided results of PST FS POC INR = 1.4 on 30mg  warfarin/wk. Was increased to 35mg  warfarin/wk and advised to repeat PST FS POC INR in 2 weeks at steady state on new regimen.

## 2019-05-10 ENCOUNTER — Other Ambulatory Visit: Payer: Self-pay | Admitting: Internal Medicine

## 2019-05-10 ENCOUNTER — Telehealth: Payer: Self-pay | Admitting: *Deleted

## 2019-05-10 DIAGNOSIS — Z1231 Encounter for screening mammogram for malignant neoplasm of breast: Secondary | ICD-10-CM

## 2019-05-10 NOTE — Telephone Encounter (Signed)
Received Order Console Reports-Orders from [facility] via fax.  For provider to sign,date, and return.  Given to provider to sign.

## 2019-05-15 ENCOUNTER — Emergency Department (HOSPITAL_COMMUNITY)
Admission: EM | Admit: 2019-05-15 | Discharge: 2019-05-15 | Disposition: A | Discharge status: Home / Self Care | Payer: Medicare Other | Attending: Emergency Medicine | Admitting: Emergency Medicine

## 2019-05-15 ENCOUNTER — Emergency Department (HOSPITAL_COMMUNITY): Payer: Medicare Other

## 2019-05-15 ENCOUNTER — Other Ambulatory Visit: Payer: Self-pay

## 2019-05-15 ENCOUNTER — Encounter (HOSPITAL_COMMUNITY): Payer: Self-pay | Admitting: Emergency Medicine

## 2019-05-15 DIAGNOSIS — I5042 Chronic combined systolic (congestive) and diastolic (congestive) heart failure: Secondary | ICD-10-CM | POA: Insufficient documentation

## 2019-05-15 DIAGNOSIS — F17210 Nicotine dependence, cigarettes, uncomplicated: Secondary | ICD-10-CM | POA: Insufficient documentation

## 2019-05-15 DIAGNOSIS — M1611 Unilateral primary osteoarthritis, right hip: Secondary | ICD-10-CM | POA: Diagnosis not present

## 2019-05-15 DIAGNOSIS — M545 Low back pain: Secondary | ICD-10-CM | POA: Diagnosis not present

## 2019-05-15 DIAGNOSIS — I2510 Atherosclerotic heart disease of native coronary artery without angina pectoris: Secondary | ICD-10-CM | POA: Insufficient documentation

## 2019-05-15 DIAGNOSIS — Z79899 Other long term (current) drug therapy: Secondary | ICD-10-CM | POA: Insufficient documentation

## 2019-05-15 DIAGNOSIS — J45909 Unspecified asthma, uncomplicated: Secondary | ICD-10-CM | POA: Diagnosis not present

## 2019-05-15 DIAGNOSIS — M5431 Sciatica, right side: Secondary | ICD-10-CM

## 2019-05-15 DIAGNOSIS — Z7901 Long term (current) use of anticoagulants: Secondary | ICD-10-CM | POA: Diagnosis not present

## 2019-05-15 DIAGNOSIS — J449 Chronic obstructive pulmonary disease, unspecified: Secondary | ICD-10-CM | POA: Insufficient documentation

## 2019-05-15 DIAGNOSIS — Z791 Long term (current) use of non-steroidal anti-inflammatories (NSAID): Secondary | ICD-10-CM | POA: Insufficient documentation

## 2019-05-15 DIAGNOSIS — I110 Hypertensive heart disease with heart failure: Secondary | ICD-10-CM | POA: Insufficient documentation

## 2019-05-15 DIAGNOSIS — M25551 Pain in right hip: Secondary | ICD-10-CM | POA: Diagnosis present

## 2019-05-15 DIAGNOSIS — M5441 Lumbago with sciatica, right side: Secondary | ICD-10-CM | POA: Diagnosis not present

## 2019-05-15 MED ORDER — HYDROCODONE-ACETAMINOPHEN 5-325 MG PO TABS: 1.0000 | ORAL_TABLET | Freq: Four times a day (QID) | ORAL | 0 refills | Status: DC | PRN

## 2019-05-15 MED ORDER — HYDROCODONE-ACETAMINOPHEN 5-325 MG PO TABS
1.0000 | ORAL_TABLET | Freq: Once | ORAL | Status: AC
  Administered 2019-05-15: 11:00:00 1 via ORAL
  Filled 2019-05-15: qty 1

## 2019-05-15 MED ORDER — PREDNISONE 50 MG PO TABS: 50.0000 mg | ORAL_TABLET | Freq: Every day | ORAL | 0 refills | Status: DC

## 2019-05-15 NOTE — ED Provider Notes (Signed)
[facility] EMERGENCY DEPARTMENT Provider Note   CSN: [id number] Arrival date & time: 05/15/19  [id number]     History   Chief Complaint Chief Complaint  Patient presents with  . Hip Pain    HPI Kim Anthony is a 68 y.o. female.     HPI Patient presents to the emergency department with lower back pain that radiates into her hip upper leg x3 days.  Patient states she had back issues in the past.  The patient states nothing seems make condition better but certain movements palpation make the pain worse.  Patient states she did not take any medications prior to arrival for her symptoms.  The patient denies chest pain, shortness of breath, headache,blurred vision, neck pain, fever, cough, weakness, numbness, dizziness, anorexia, edema, abdominal pain, nausea, vomiting, diarrhea, rash,dysuria, hematemesis, bloody stool, near syncope, or syncope. Past Medical History:  Diagnosis Date  . Arthritis   . Asthma   . Breast cancer ([place]) 2001   Left Breast Cancer  . CHF (congestive heart failure) ([place])   . COPD (chronic obstructive pulmonary disease) ([place])   . Coronary artery disease   . History of mitral valve replacement with mechanical valve   . HLD (hyperlipidemia)   . Hypertension   . Pulmonary embolism [facility])     Patient Active Problem List   Diagnosis Date Noted  . Pustular rash 03/09/2019  . Anemia 02/16/2019  . Breast cancer ([place])   . Acquired absence of left breast 12/20/2018  . Open wound of axillary region 05/11/2018  . COPD (chronic obstructive pulmonary disease) ([place]) 11/25/2017  . S/P MVR (mitral valve replacement)   . Chronic anticoagulation   . Tobacco use disorder 02/25/2017  . Lymphadenopathy, mediastinal 02/25/2017  . History of mitral valve replacement with mechanical valve 11/18/2016  . Essential hypertension 02/01/2016  . Allergic rhinitis 02/01/2016  . Healthcare maintenance 01/30/2016  . Chronic low back pain 07/22/2015  . Pulmonary  embolism ([place]) 07/11/2015  . Chronic combined systolic and diastolic congestive heart failure ([place]) 07/11/2015  . Headache, common migraine 07/11/2015  . HLD (hyperlipidemia) 07/11/2015    Past Surgical History:  Procedure Laterality Date  . APPLICATION OF A-CELL OF CHEST/ABDOMEN Left 01/27/2019   Procedure: APPLICATION OF A-CELL OF CHEST;  Surgeon: Wallace Going, DO;  Location: [facility];  Service: Plastics;  Laterality: Left;  . APPLICATION OF WOUND VAC N/A 01/27/2019   Procedure: APPLICATION OF WOUND VAC;  Surgeon: Wallace Going, DO;  Location: [facility];  Service: Plastics;  Laterality: N/A;  . COLONOSCOPY WITH PROPOFOL N/A 06/07/2018   Procedure: COLONOSCOPY WITH PROPOFOL;  Surgeon: Wilford Corner, MD;  Location: [facility];  Service: Endoscopy;  Laterality: N/A;  . DEBRIDEMENT AND CLOSURE WOUND Left 12/28/2018   Procedure: Debridement And Closure Wound;  Surgeon: Coralie Keens, MD;  Location: [facility];  Service: General;  Laterality: Left;  . INCISION AND DRAINAGE OF WOUND Left 01/27/2019   Procedure: IRRIGATION AND DEBRIDEMENT OF CHEST WALL;  Surgeon: Wallace Going, DO;  Location: [facility];  Service: Plastics;  Laterality: Left;  . LATISSIMUS FLAP TO BREAST Left 01/18/2019   Procedure: LEFT LATISSIMUS FLAP TO BREAST;  Surgeon: Wallace Going, DO;  Location: [facility];  Service: Plastics;  Laterality: Left;  [name] MASTECTOMY Left 2000s   for breast cancer, also s/p radiation and chemo  . MITRAL VALVE REPLACEMENT     mechanical  . POLYPECTOMY  06/07/2018   Procedure: POLYPECTOMY;  Surgeon: Wilford Corner, MD;  Location:  [facility];  Service: Endoscopy;;  . WOUND DEBRIDEMENT Left 12/28/2018   Procedure: EXCISION OF LEFT CHRONIC CHEST WALL WOUND;  Surgeon: Coralie Keens, MD;  Location: [facility];  Service: General;  Laterality: Left;     OB History    Gravida  2   Para  2   Term  2   Preterm      AB      Living        SAB      TAB      Ectopic       Multiple      Live Births  2            Home Medications    Prior to Admission medications   Medication Sig Start Date End Date Taking? Authorizing Provider  albuterol (PROVENTIL) (2.5 MG/3ML) 0.083% nebulizer solution Take 3 mLs (2.5 mg total) by nebulization every 6 (six) hours as needed for wheezing or shortness of breath. 11/26/17   Katherine Roan, MD  albuterol (VENTOLIN HFA) 108 (90 Base) MCG/ACT inhaler Inhale 1-2 puffs into the lungs every 6 (six) hours as needed for wheezing or shortness of breath. 12/26/18   Sid Falcon, MD  ANORO ELLIPTA 62.5-25 MCG/INH AEPB Inhale 1 puff by mouth once daily Patient taking differently: Inhale 1 puff into the lungs daily.  08/25/18   Isabelle Course, MD  Calcium-Phosphorus-Vitamin D (CITRACAL +D3 PO) Take 1 tablet by mouth 2 (two) times a day.    [provider]  diazepam (VALIUM) 2 MG tablet Take 1 tablet (2 mg total) by mouth every 12 (twelve) hours as needed for anxiety. 05/02/19   Scheeler, Carola Rhine, PA-C  diclofenac (VOLTAREN) 50 MG EC tablet Take 1 tablet (50 mg total) by mouth 2 (two) times daily. 05/02/19   Scheeler, Carola Rhine, PA-C  fluticasone (FLONASE) 50 MCG/ACT nasal spray Place 2 sprays into both nostrils daily. Patient taking differently: Place 1 spray into both nostrils daily as needed for allergies.  12/03/16 04/11/26  Jule Ser, DO  furosemide (LASIX) 40 MG tablet Take 1 tablet (40 mg total) by mouth daily. 02/16/19 02/16/20  Marianna Payment, MD  gabapentin (NEURONTIN) 600 MG tablet Take 1 tablet (600 mg total) by mouth at bedtime. 02/01/19   Monica Becton, MD  HYDROcodone-acetaminophen (NORCO/VICODIN) 5-325 MG tablet Take 1 tablet by mouth every 6 (six) hours as needed for moderate pain. 05/15/19   Shaquasha Gerstel, [place] Gave, PA-C  lisinopril (ZESTRIL) 10 MG tablet Take 1 tablet (10 mg total) by mouth daily. 02/16/19 02/16/20  Marianna Payment, MD  metoprolol succinate (TOPROL-XL) 25 MG 24 hr tablet TAKE 1/2 (ONE-HALF)  TABLET BY MOUTH ONCE DAILY 04/09/19   Marianna Payment, MD  polyethylene glycol (MIRALAX / GLYCOLAX) 17 g packet Take 17 g by mouth daily as needed for mild constipation. 02/01/19   Monica Becton, MD  potassium chloride SA (K-DUR) 20 MEQ tablet Take 2 tablets (40 mEq total) by mouth daily. 12/26/18   Sid Falcon, MD  predniSONE (DELTASONE) 50 MG tablet Take 1 tablet (50 mg total) by mouth daily with breakfast. 05/15/19   Wheeler Incorvaia, [place] Gave, PA-C  senna (SENOKOT) 8.6 MG TABS tablet Take 1 tablet (8.6 mg total) by mouth 2 (two) times daily. 02/01/19   Monica Becton, MD  simvastatin (ZOCOR) 20 MG tablet Take 1 tablet (20 mg total) by mouth daily. 02/16/19   Marianna Payment, MD  triamcinolone (KENALOG) 0.025 % ointment APPLY  OINTMENT TOPICALLY TO AFFECTED AREA TWICE DAILY  03/07/19   Dillingham, Loel Lofty, DO  warfarin (COUMADIN) 5 MG tablet Take 1 and 1/2 tablets on Sundays, Tuesdays, Thursdays and Saturdays. Mondays, Wednesdays and Fridays, take only 1 tablet. 03/06/19   Pennie Banter, RPH-CPP    Family History Family History  Problem Relation Age of Onset  . Heart attack Other   . Breast cancer Maternal Aunt 47    Social History Social History   Tobacco Use  . Smoking status: Current Every Day Smoker    Packs/day: 0.25    Years: 30.00    Pack years: 7.50    Types: Cigarettes  . Smokeless tobacco: Never Used  . Tobacco comment: Sometimes less.  Substance Use Topics  . Alcohol use: Yes    Comment: beer- ocasional  . Drug use: No     Allergies   Acetaminophen-codeine, Propoxyphene, Tramadol, and Tape   Review of Systems Review of Systems All other systems negative except as documented in the HPI. All pertinent positives and negatives as reviewed in the HPI.  Physical Exam Updated Vital Signs BP (!) 147/91 (BP Location: Right Arm)   Pulse 79   Temp (!) 97.4 F (36.3 C) (Oral)   Resp 20   Ht 5\' 5"  (1.651 m)   Wt 71.2 kg   SpO2 99%   BMI 26.13 kg/m   Physical Exam  Vitals signs and nursing note reviewed.  Constitutional:      General: She is not in acute distress.    Appearance: She is well-developed.  HENT:     Head: Normocephalic and atraumatic.  Eyes:     Pupils: Pupils are equal, round, and reactive to light.  Pulmonary:     Effort: Pulmonary effort is normal.  Musculoskeletal:     Lumbar back: She exhibits tenderness and pain.       Back:  Skin:    General: Skin is warm and dry.  Neurological:     Mental Status: She is alert and oriented to person, place, and time.      ED Treatments / Results  Labs (all labs ordered are listed, but only abnormal results are displayed) Labs Reviewed - No data to display  EKG None  Radiology Dg Lumbar Spine Complete  Result Date: 05/15/2019 CLINICAL DATA:  Low back pain, radicular symptoms, no injury EXAM: LUMBAR SPINE - COMPLETE 4+ VIEW COMPARISON:  03/23/2016 FINDINGS: No fracture or dislocation of the lumbar spine. Mild multilevel disc space height loss and osteophytosis. Vertebral body heights are preserved. Mild multilevel facet degenerative change. Nonobstructive pattern of overlying bowel gas. Aortic atherosclerosis. IMPRESSION: No fracture or dislocation of the lumbar spine. Mild multilevel disc space height loss and osteophytosis. Vertebral body heights are preserved. Mild multilevel facet degenerative change. Degenerative findings are not significantly changed compared to prior examination dated 03/23/2016. Lumbar disc and neural foraminal pathology may be further evaluated by MRI if indicated by localizing signs and symptoms. Electronically Signed   By: Eddie Candle M.D.   On: 05/15/2019 11:32   Dg Hip Unilat  With Pelvis 2-3 Views Right  Result Date: 05/15/2019 CLINICAL DATA:  Right hip pain without known injury. EXAM: DG HIP (WITH OR WITHOUT PELVIS) 2-3V RIGHT COMPARISON:  None. FINDINGS: There is no evidence of hip fracture or dislocation. Mild narrowing is noted medially in the right  hip joint. IMPRESSION: Mild degenerative joint disease is noted in the right hip. No fracture or dislocation is noted. Electronically Signed   By: Bobbe Medico.D.  On: 05/15/2019 10:27    Procedures Procedures (including critical care time)  Medications Ordered in ED Medications  HYDROcodone-acetaminophen (NORCO/VICODIN) 5-325 MG per tablet 1 tablet (1 tablet Oral Given 05/15/19 1105)     Initial Impression / Assessment and Plan / ED Course  I have reviewed the triage vital signs and the nursing notes.  Pertinent labs & imaging results that were available during my care of the patient were reviewed by me and considered in my medical decision making (see chart for details).        Patient has no concerning findings with her lower back pain.  I did advise her that she need to follow-up with her primary doctor and gave her referral as needed.  The patient does not have any neurological deficits noted on examination.  She has normal strength and range of motion of her lower extremities along with normal deep tendon reflexes.  Patient is advised the plan and all questions were answered.  Final Clinical Impressions(s) / ED Diagnoses   Final diagnoses:  Sciatica of right side    ED Discharge Orders         Ordered    predniSONE (DELTASONE) 50 MG tablet  Daily with breakfast     05/15/19 1222    HYDROcodone-acetaminophen (NORCO/VICODIN) 5-325 MG tablet  Every 6 hours PRN     05/15/19 [address] PA-C 05/16/19 1519    Isla Pence, MD 05/16/19 1533

## 2019-05-15 NOTE — Discharge Instructions (Signed)
You will need to follow-up with your primary care doctor.  Return here as needed.  I feel that the issue is coming from your lower back radiating into your leg from the sciatic nerve.  Use ice and heat on your lower back.

## 2019-05-15 NOTE — ED Triage Notes (Signed)
Pt reports right sided lower back and hip pain for 3-4 days. Denies any falls. Pain is when standing.

## 2019-05-15 NOTE — ED Notes (Signed)
Patient verbalizes understanding of discharge instructions. Opportunity for questioning and answers were provided. Armband removed by staff, pt discharged from ED home via POV.  

## 2019-05-15 NOTE — ED Notes (Signed)
Patient transported to X-ray 

## 2019-05-16 ENCOUNTER — Telehealth: Payer: Self-pay | Admitting: Pharmacist

## 2019-05-16 NOTE — Telephone Encounter (Signed)
Reviewed note.  Agree with plan.

## 2019-05-16 NOTE — Telephone Encounter (Signed)
Patient self-testing, point of care, fingerstick INR performed at home on 15-May-2019 was 3.0 Was provided this information by the patient's daughter, after hours. Daughter was instructed to CONTINUE SAME dosing regimen of 5mg  warfarin, PO QD at 6PM. Recollect INR in 2 weeks. Daughter denies any symptoms/signs/sources of bleeding.

## 2019-05-22 ENCOUNTER — Telehealth: Payer: Self-pay | Admitting: Pharmacist

## 2019-05-22 NOTE — Telephone Encounter (Signed)
Alerted by the patient's daugter that her mother's PST FS POC INR = 3.4 (goal 2.5 - 3.5) on 35mg  warfarin/wk. Reduced to 32.5mg  warfarin/wk, retest PST FS POC INR in 2 weeks 05-Jun-2019. Denies bleeding signs or symptoms. No new medications, no missed doses, adequate supply of warfarin on-hand.

## 2019-05-23 ENCOUNTER — Ambulatory Visit (INDEPENDENT_AMBULATORY_CARE_PROVIDER_SITE_OTHER): Payer: Medicare Other | Admitting: Plastic Surgery

## 2019-05-23 ENCOUNTER — Other Ambulatory Visit: Payer: Self-pay

## 2019-05-23 ENCOUNTER — Encounter: Payer: Self-pay | Admitting: Plastic Surgery

## 2019-05-23 VITALS — BP 127/85 | HR 88 | Temp 97.1°F | Ht 65.0 in | Wt 166.0 lb

## 2019-05-23 DIAGNOSIS — S41102D Unspecified open wound of left upper arm, subsequent encounter: Secondary | ICD-10-CM | POA: Diagnosis not present

## 2019-05-23 NOTE — Progress Notes (Signed)
   Subjective:    Patient ID: Kim Anthony, female    DOB: 02-Jul-1950, 68 y.o.   [name]: [id number]  The patient is a 68 here for follow-up on her left breast /chest wound.  Today she is completely healed.  She also states that she has no pain.   She is extremely happy.  No sign of seroma or hematoma.  No sign of infection.  She is scheduled to see physical therapy later this week.     Review of Systems  Constitutional: Negative.   Respiratory: Negative.  Negative for shortness of breath.   Cardiovascular: Negative.   Gastrointestinal: Negative.   Musculoskeletal: Negative.   Skin: Positive for color change. Negative for wound.       Objective:   Physical Exam Vitals signs and nursing note reviewed.  Constitutional:      Appearance: Normal appearance.  HENT:     Head: Normocephalic.  Cardiovascular:     Rate and Rhythm: Normal rate.     Pulses: Normal pulses.  Pulmonary:     Effort: Pulmonary effort is normal.  Skin:    General: Skin is warm.  Neurological:     General: No focal deficit present.     Mental Status: She is alert and oriented to person, place, and time.  Psychiatric:        Mood and Affect: Mood normal.        Behavior: Behavior normal.        Thought Content: Thought content normal.        Assessment & Plan:     ICD-10-CM   1. Open wound of left axillary region, subsequent encounter  S41.102D     Pictures were obtained of the patient and placed in the chart with the patient's or guardian's permission. Continue massage to the area, still highly recommend physical therapy and follow-up as needed.

## 2019-05-24 NOTE — Telephone Encounter (Signed)
Order was signed and faxed to [facility].  Confirmation received.//AB/CMA

## 2019-05-26 ENCOUNTER — Telehealth: Payer: Self-pay | Admitting: Plastic Surgery

## 2019-05-26 ENCOUNTER — Telehealth: Payer: Self-pay | Admitting: *Deleted

## 2019-05-26 NOTE — Telephone Encounter (Signed)
Erin from [facility] called to let us know that they would discharging her nursing care services due to her wound being healed but that her PT and OT care would continue. If you have questions, she said to call her back.

## 2019-05-26 NOTE — Telephone Encounter (Signed)
Thank you  for update

## 2019-05-26 NOTE — Telephone Encounter (Signed)
Received Missed visit report on (05/24/19) via fax from [facility] for review.  Dr. Marla Roe reviewed and signed off on it.//AB/CMA

## 2019-05-28 DIAGNOSIS — Z4789 Encounter for other orthopedic aftercare: Secondary | ICD-10-CM | POA: Diagnosis not present

## 2019-05-28 DIAGNOSIS — Z4801 Encounter for change or removal of surgical wound dressing: Secondary | ICD-10-CM | POA: Diagnosis not present

## 2019-05-29 DIAGNOSIS — Z4801 Encounter for change or removal of surgical wound dressing: Secondary | ICD-10-CM | POA: Diagnosis not present

## 2019-05-29 DIAGNOSIS — Z7901 Long term (current) use of anticoagulants: Secondary | ICD-10-CM | POA: Diagnosis not present

## 2019-05-29 DIAGNOSIS — I2782 Chronic pulmonary embolism: Secondary | ICD-10-CM | POA: Diagnosis not present

## 2019-05-29 DIAGNOSIS — Z952 Presence of prosthetic heart valve: Secondary | ICD-10-CM | POA: Diagnosis not present

## 2019-05-29 DIAGNOSIS — Z4789 Encounter for other orthopedic aftercare: Secondary | ICD-10-CM | POA: Diagnosis not present

## 2019-05-30 ENCOUNTER — Telehealth: Payer: Self-pay | Admitting: Pharmacist

## 2019-05-30 DIAGNOSIS — Z4789 Encounter for other orthopedic aftercare: Secondary | ICD-10-CM | POA: Diagnosis not present

## 2019-05-30 DIAGNOSIS — Z4801 Encounter for change or removal of surgical wound dressing: Secondary | ICD-10-CM | POA: Diagnosis not present

## 2019-05-30 NOTE — Telephone Encounter (Signed)
Received call from patient's daughter with PST FS POC INR results 4.8 (goal 2.5 - 3.5). Was instructed to OMIT dose for evening of 29-May-2019. Daughter was provided remaining week instructions 1/2 x 5mg  (2.5mg  dose) on Tuesday/Friday of this week. All other days, she will  take 1x5mg  dose (Wednesday, Thursday, Saturday, Sunday). Will re-collect patient self-testing, fingerstick point of care INR on Monday, 05-Jun-2019 and report value to me. Denies any source of bleeding. She is afebrile (fever increases metabolic clearance of the vitamin K dependent clotting factors, [VII, IX, X and II] and has had no weight loss (possible hypoalbunemia). No new medications. Is off of all antibiotics now.

## 2019-06-01 DIAGNOSIS — Z4801 Encounter for change or removal of surgical wound dressing: Secondary | ICD-10-CM | POA: Diagnosis not present

## 2019-06-01 DIAGNOSIS — Z4789 Encounter for other orthopedic aftercare: Secondary | ICD-10-CM | POA: Diagnosis not present

## 2019-06-02 ENCOUNTER — Emergency Department (HOSPITAL_COMMUNITY)
Admission: EM | Admit: 2019-06-02 | Discharge: 2019-06-02 | Disposition: A | Discharge status: Home / Self Care | Payer: Medicare Other | Attending: Emergency Medicine | Admitting: Emergency Medicine

## 2019-06-02 DIAGNOSIS — M25551 Pain in right hip: Secondary | ICD-10-CM | POA: Diagnosis not present

## 2019-06-02 DIAGNOSIS — I110 Hypertensive heart disease with heart failure: Secondary | ICD-10-CM | POA: Insufficient documentation

## 2019-06-02 DIAGNOSIS — J449 Chronic obstructive pulmonary disease, unspecified: Secondary | ICD-10-CM | POA: Diagnosis not present

## 2019-06-02 DIAGNOSIS — I2510 Atherosclerotic heart disease of native coronary artery without angina pectoris: Secondary | ICD-10-CM | POA: Diagnosis not present

## 2019-06-02 DIAGNOSIS — M5441 Lumbago with sciatica, right side: Secondary | ICD-10-CM

## 2019-06-02 DIAGNOSIS — F17210 Nicotine dependence, cigarettes, uncomplicated: Secondary | ICD-10-CM | POA: Insufficient documentation

## 2019-06-02 DIAGNOSIS — Z7901 Long term (current) use of anticoagulants: Secondary | ICD-10-CM | POA: Diagnosis not present

## 2019-06-02 DIAGNOSIS — I509 Heart failure, unspecified: Secondary | ICD-10-CM | POA: Diagnosis not present

## 2019-06-02 DIAGNOSIS — M545 Low back pain: Secondary | ICD-10-CM | POA: Insufficient documentation

## 2019-06-02 DIAGNOSIS — Z79899 Other long term (current) drug therapy: Secondary | ICD-10-CM | POA: Diagnosis not present

## 2019-06-02 MED ORDER — HYDROCODONE-ACETAMINOPHEN 5-325 MG PO TABS
1.0000 | ORAL_TABLET | Freq: Once | ORAL | Status: AC
  Administered 2019-06-02: 1 via ORAL
  Filled 2019-06-02: qty 1

## 2019-06-02 MED ORDER — PREDNISONE 50 MG PO TABS: 50.0000 mg | ORAL_TABLET | Freq: Every day | ORAL | 0 refills | Status: AC

## 2019-06-02 MED ORDER — HYDROCODONE-ACETAMINOPHEN 5-325 MG PO TABS: 1.0000 | ORAL_TABLET | ORAL | 0 refills | Status: DC | PRN

## 2019-06-02 MED ORDER — PREDNISONE 20 MG PO TABS
60.0000 mg | ORAL_TABLET | Freq: Once | ORAL | Status: AC
  Administered 2019-06-02: 13:00:00 60 mg via ORAL
  Filled 2019-06-02: qty 3

## 2019-06-02 NOTE — ED Triage Notes (Signed)
Pt reports right hip and buttocks pain for the last 3 days. Pt ambulatory with limp. VSS.

## 2019-06-02 NOTE — Discharge Instructions (Addendum)
Take the medication as prescribed. Return for new or worsening symptoms. Follow up with Neurosurgery. You will need to call to make an appointment.

## 2019-06-02 NOTE — ED Provider Notes (Signed)
[facility] EMERGENCY DEPARTMENT Provider Note   CSN: [id number] Arrival date & time: 06/02/19  [id number]    History Chief Complaint  Patient presents with  . Hip Pain    Kim Anthony is a 68 y.o. female with medical history significant for CHF, COPD, hypertension, PE, chronic anticoagulation who presents for evaluation of right lower back pain and hip pain.  Patient states she has history of sciatica and states this feels the same.  Was previously followed by an orthopedist in [place].  Had MRI within the last year from orthopedist office however this did not show "anything they can do."  States she had x-rays 3 weeks ago when she was here and does not want any more imaging.  Patient states her symptoms resolved after 3 days previously however returned 3 days ago.  Pain located to right posterior lumbar spine and radiates down her right gluteus into her posterior right leg ending at her knee.  Pain worse with movement.  Denies fever, chills, nausea, vomiting, chest pain, shortness of breath, abdominal pain, diarrhea, dysuria, hematuria, constipation, bowel or bladder incontinence, saddle paresthesia, chronic steroid use, IV drug use.  Has been taking Tylenol at home without relief of her symptoms.  Cannot take NSAIDs due to anticoagulation.  She has been able to walk however states it is painful to do so.  No recent trauma or injury.  Rates her current pain a 9/10.  Denies additional aggravating or relieving factors.  History obtained from patient and past medical records.  No interpreter is used.  HPI     Past Medical History:  Diagnosis Date  . Arthritis   . Asthma   . Breast cancer ([place]) 2001   Left Breast Cancer  . CHF (congestive heart failure) ([place])   . COPD (chronic obstructive pulmonary disease) ([place])   . Coronary artery disease   . History of mitral valve replacement with mechanical valve   . HLD (hyperlipidemia)   . Hypertension   . Pulmonary embolism  [facility])     Patient Active Problem List   Diagnosis Date Noted  . Pustular rash 03/09/2019  . Anemia 02/16/2019  . Breast cancer ([place])   . Acquired absence of left breast 12/20/2018  . Open wound of axillary region 05/11/2018  . COPD (chronic obstructive pulmonary disease) ([place]) 11/25/2017  . S/P MVR (mitral valve replacement)   . Chronic anticoagulation   . Tobacco use disorder 02/25/2017  . Lymphadenopathy, mediastinal 02/25/2017  . History of mitral valve replacement with mechanical valve 11/18/2016  . Essential hypertension 02/01/2016  . Allergic rhinitis 02/01/2016  . Healthcare maintenance 01/30/2016  . Chronic low back pain 07/22/2015  . Pulmonary embolism ([place]) 07/11/2015  . Chronic combined systolic and diastolic congestive heart failure ([place]) 07/11/2015  . Headache, common migraine 07/11/2015  . HLD (hyperlipidemia) 07/11/2015    Past Surgical History:  Procedure Laterality Date  . APPLICATION OF A-CELL OF CHEST/ABDOMEN Left 01/27/2019   Procedure: APPLICATION OF A-CELL OF CHEST;  Surgeon: Wallace Going, DO;  Location: [facility];  Service: Plastics;  Laterality: Left;  . APPLICATION OF WOUND VAC N/A 01/27/2019   Procedure: APPLICATION OF WOUND VAC;  Surgeon: Wallace Going, DO;  Location: [facility];  Service: Plastics;  Laterality: N/A;  . COLONOSCOPY WITH PROPOFOL N/A 06/07/2018   Procedure: COLONOSCOPY WITH PROPOFOL;  Surgeon: Wilford Corner, MD;  Location: [facility];  Service: Endoscopy;  Laterality: N/A;  . DEBRIDEMENT AND CLOSURE WOUND Left 12/28/2018   Procedure: Debridement  And Closure Wound;  Surgeon: Coralie Keens, MD;  Location: [facility];  Service: General;  Laterality: Left;  . INCISION AND DRAINAGE OF WOUND Left 01/27/2019   Procedure: IRRIGATION AND DEBRIDEMENT OF CHEST WALL;  Surgeon: Wallace Going, DO;  Location: [facility];  Service: Plastics;  Laterality: Left;  . LATISSIMUS FLAP TO BREAST Left 01/18/2019   Procedure: LEFT LATISSIMUS FLAP TO  BREAST;  Surgeon: Wallace Going, DO;  Location: [facility];  Service: Plastics;  Laterality: Left;  [name] MASTECTOMY Left 2000s   for breast cancer, also s/p radiation and chemo  . MITRAL VALVE REPLACEMENT     mechanical  . POLYPECTOMY  06/07/2018   Procedure: POLYPECTOMY;  Surgeon: Wilford Corner, MD;  Location: [facility];  Service: Endoscopy;;  . WOUND DEBRIDEMENT Left 12/28/2018   Procedure: EXCISION OF LEFT CHRONIC CHEST WALL WOUND;  Surgeon: Coralie Keens, MD;  Location: [facility];  Service: General;  Laterality: Left;     OB History    Gravida  2   Para  2   Term  2   Preterm      AB      Living        SAB      TAB      Ectopic      Multiple      Live Births  2           Family History  Problem Relation Age of Onset  . Heart attack Other   . Breast cancer Maternal Aunt 69    Social History   Tobacco Use  . Smoking status: Current Every Day Smoker    Packs/day: 0.25    Years: 30.00    Pack years: 7.50    Types: Cigarettes  . Smokeless tobacco: Never Used  . Tobacco comment: Sometimes less.  Substance Use Topics  . Alcohol use: Yes    Comment: beer- ocasional  . Drug use: No    Home Medications Prior to Admission medications   Medication Sig Start Date End Date Taking? Authorizing Provider  albuterol (PROVENTIL) (2.5 MG/3ML) 0.083% nebulizer solution Take 3 mLs (2.5 mg total) by nebulization every 6 (six) hours as needed for wheezing or shortness of breath. 11/26/17   Katherine Roan, MD  albuterol (VENTOLIN HFA) 108 (90 Base) MCG/ACT inhaler Inhale 1-2 puffs into the lungs every 6 (six) hours as needed for wheezing or shortness of breath. 12/26/18   Sid Falcon, MD  ANORO ELLIPTA 62.5-25 MCG/INH AEPB Inhale 1 puff by mouth once daily Patient taking differently: Inhale 1 puff into the lungs daily.  08/25/18   Isabelle Course, MD  Calcium-Phosphorus-Vitamin D (CITRACAL +D3 PO) Take 1 tablet by mouth 2 (two) times a day.    [provider]  diazepam (VALIUM) 2 MG tablet Take 1 tablet (2 mg total) by mouth every 12 (twelve) hours as needed for anxiety. Patient not taking: Reported on 05/23/2019 05/02/19   Scheeler, Carola Rhine, PA-C  diclofenac (VOLTAREN) 50 MG EC tablet Take 1 tablet (50 mg total) by mouth 2 (two) times daily. 05/02/19   Scheeler, Carola Rhine, PA-C  fluticasone (FLONASE) 50 MCG/ACT nasal spray Place 2 sprays into both nostrils daily. Patient taking differently: Place 1 spray into both nostrils daily as needed for allergies.  12/03/16 04/11/26  Jule Ser, DO  furosemide (LASIX) 40 MG tablet Take 1 tablet (40 mg total) by mouth daily. 02/16/19 02/16/20  Marianna Payment, MD  gabapentin (NEURONTIN) 600 MG  tablet Take 1 tablet (600 mg total) by mouth at bedtime. 02/01/19   Monica Becton, MD  HYDROcodone-acetaminophen (NORCO/VICODIN) 5-325 MG tablet Take 1-2 tablets by mouth every 4 (four) hours as needed. 06/02/19   Gordon Carlson A, PA-C  lisinopril (ZESTRIL) 10 MG tablet Take 1 tablet (10 mg total) by mouth daily. 02/16/19 02/16/20  Marianna Payment, MD  metoprolol succinate (TOPROL-XL) 25 MG 24 hr tablet TAKE 1/2 (ONE-HALF) TABLET BY MOUTH ONCE DAILY 04/09/19   Marianna Payment, MD  polyethylene glycol (MIRALAX / GLYCOLAX) 17 g packet Take 17 g by mouth daily as needed for mild constipation. 02/01/19   Monica Becton, MD  potassium chloride SA (K-DUR) 20 MEQ tablet Take 2 tablets (40 mEq total) by mouth daily. 12/26/18   Sid Falcon, MD  predniSONE (DELTASONE) 50 MG tablet Take 1 tablet (50 mg total) by mouth daily for 7 days. 06/02/19 06/09/19  Trenell Concannon A, PA-C  senna (SENOKOT) 8.6 MG TABS tablet Take 1 tablet (8.6 mg total) by mouth 2 (two) times daily. 02/01/19   Monica Becton, MD  simvastatin (ZOCOR) 20 MG tablet Take 1 tablet (20 mg total) by mouth daily. 02/16/19   Marianna Payment, MD  triamcinolone (KENALOG) 0.025 % ointment APPLY  OINTMENT TOPICALLY TO AFFECTED AREA TWICE DAILY 03/07/19    Dillingham, Loel Lofty, DO  warfarin (COUMADIN) 5 MG tablet Take 1 and 1/2 tablets on Sundays, Tuesdays, Thursdays and Saturdays. Mondays, Wednesdays and Fridays, take only 1 tablet. 03/06/19   Pennie Banter, RPH-CPP    Allergies    Acetaminophen-codeine, Propoxyphene, Tramadol, and Tape  Review of Systems   Review of Systems  Constitutional: Negative.   HENT: Negative.   Respiratory: Negative.   Cardiovascular: Negative.   Gastrointestinal: Negative.   Genitourinary: Negative.   Musculoskeletal: Positive for back pain and gait problem. Negative for arthralgias, joint swelling, myalgias, neck pain and neck stiffness.  Skin: Negative.   All other systems reviewed and are negative.   Physical Exam Updated Vital Signs BP (!) 162/101 (BP Location: Right Arm)   Pulse 71   Temp 98.5 F (36.9 C) (Oral)   Resp 20   Ht 5\' 5"  (1.651 m)   Wt 75.3 kg   SpO2 97%   BMI 27.62 kg/m   Physical Exam Physical Exam  Constitutional: Pt appears well-developed and well-nourished. No distress.  HENT:  Head: Normocephalic and atraumatic.  Mouth/Throat: Oropharynx is clear and moist. No oropharyngeal exudate.  Eyes: Conjunctivae are normal.  Neck: Normal range of motion. Neck supple.  Full ROM without pain  Cardiovascular: Normal rate, regular rhythm and intact distal pulses.   Pulmonary/Chest: Effort normal and breath sounds normal. No respiratory distress. Pt has no wheezes.  Abdominal: Soft. Pt exhibits no distension. There is no tenderness, rebound or guarding. No abd bruit or pulsatile mass Musculoskeletal:  Full range of motion of the T-spine and L-spine with flexion, hyperextension, and lateral flexion. No midline tenderness or stepoffs. No tenderness to palpation of the spinous processes of the T-spine or L-spine. Mild tenderness to palpation of the paraspinous muscles of the L-spine, cleaning the piriformis and SI joint.  Positive straight leg raise on right at 40  degrees. Lymphadenopathy:    Pt has no cervical adenopathy.  Neurological: Pt is alert. Pt has normal reflexes.  Reflex Scores:      Bicep reflexes are 2+ on the right side and 2+ on the left side.      Brachioradialis reflexes are 2+ on the right  side and 2+ on the left side.      Patellar reflexes are 2+ on the right side and 2+ on the left side.      Achilles reflexes are 2+ on the right side and 2+ on the left side. Speech is clear and goal oriented, follows commands Normal 5/5 strength in upper and lower extremities bilaterally including dorsiflexion and plantar flexion, strong and equal grip strength Sensation normal to light and sharp touch Moves extremities without ataxia, coordination intact Normal gait Normal balance No Clonus Skin: Skin is warm and dry. No rash noted or lesions noted. Pt is not diaphoretic. No erythema, ecchymosis,edema or warmth.  Psychiatric: Pt has a normal mood and affect. Behavior is normal.  Nursing note and vitals reviewed. ED Results / Procedures / Treatments   Labs (all labs ordered are listed, but only abnormal results are displayed) Labs Reviewed - No data to display  EKG None  Radiology No results found.  Procedures Procedures (including critical care time)  Medications Ordered in ED Medications  HYDROcodone-acetaminophen (NORCO/VICODIN) 5-325 MG per tablet 1 tablet (has no administration in time range)  predniSONE (DELTASONE) tablet 60 mg (has no administration in time range)    ED Course  I have reviewed the triage vital signs and the nursing notes.  Pertinent labs & imaging results that were available during my care of the patient were reviewed by me and considered in my medical decision making (see chart for details).  68 year old female appears otherwise well presents for evaluation of right-sided lower back pain and right hip pain.  She is afebrile, nonseptic, non-ill-appearing.  History of sciatica.  Was previously being  followed by orthopedist in [place] and has had several epidural steroid injections without relief of her pain.  States she had an MRI with them within the last year which "did not show much."  She was here 3 weeks ago for similar complaints.  Had x-ray of her right hip and lumbar spine at that time.  Patient states "I do not want any pictures done today."  I discussed risk versus benefit with patient if we do not obtain imaging.  She voiced understanding of risk versus benefit and does not want imaging at this time.  No recent injuries or falls.  Denies IV drug use, bowel or bladder incontinence, saddle paresthesia.  She is ambulatory however with limp to her right leg.  When she was seen here previously her symptoms resolved after 3 days of steroids and pain medicine.  Patient states she is here for similar.  He has tenderness palpation to her right posterior lower lumbar spine as well as right piriformis and SI joint.  She has positive straight leg raise on her right.  No overlying skin changes.  Exam consistent with sciatica however due to no imaging cannot rule out malignancy given history of breast cancer, infection, myelitis, cauda equina however I have low suspicion for above.  Will refer patient outpatient to neurosurgery for her back pain.  Discussed return precautions.  Patient voiced understanding and is agreeable for follow-up.  The patient has been appropriately medically screened and/or stabilized in the ED. I have low suspicion for any other emergent medical condition which would require further screening, evaluation or treatment in the ED or require inpatient management.  Patient is hemodynamically stable and in no acute distress.  Patient able to ambulate in department prior to ED.  Evaluation does not show acute pathology that would require ongoing or additional emergent interventions while  in the emergency department or further inpatient treatment.  I have discussed the diagnosis with the  patient and answered all questions.  Pain is been managed while in the emergency department and patient has no further complaints prior to discharge.  Patient is comfortable with plan discussed in room and is stable for discharge at this time.  I have discussed strict return precautions for returning to the emergency department.  Patient was encouraged to follow-up with PCP/specialist refer to at discharge.   MDM Rules/Calculators/A&P                        Final Clinical Impression(s) / ED Diagnoses Final diagnoses:  Acute right-sided low back pain with right-sided sciatica    Rx / DC Orders ED Discharge Orders         Ordered    predniSONE (DELTASONE) 50 MG tablet  Daily     06/02/19 1218    HYDROcodone-acetaminophen (NORCO/VICODIN) 5-325 MG tablet  Every 4 hours PRN     06/02/19 1218           Larkin Alfred A, PA-C 06/02/19 1218    Virgel Manifold, MD 06/06/19 [id number]

## 2019-06-05 ENCOUNTER — Telehealth: Payer: Self-pay | Admitting: *Deleted

## 2019-06-05 NOTE — Telephone Encounter (Signed)
Received Home Health Certification and Plan of Care via fax from [facility] on (06/02/19) to be signed,date, and return.  Given to the physician to sign.//AB/CMA

## 2019-06-06 ENCOUNTER — Telehealth: Payer: Self-pay | Admitting: *Deleted

## 2019-06-06 ENCOUNTER — Telehealth: Payer: Self-pay | Admitting: Pharmacist

## 2019-06-06 ENCOUNTER — Other Ambulatory Visit: Payer: Self-pay | Admitting: Internal Medicine

## 2019-06-06 DIAGNOSIS — Z4801 Encounter for change or removal of surgical wound dressing: Secondary | ICD-10-CM | POA: Diagnosis not present

## 2019-06-06 DIAGNOSIS — Z4789 Encounter for other orthopedic aftercare: Secondary | ICD-10-CM | POA: Diagnosis not present

## 2019-06-06 NOTE — Telephone Encounter (Signed)
I have spoken with the patient's daughter, [name] and provided new instructions and when to perform next fingerstick point of care, patient self-testing INR. They report no signs or symptoms of bleeding, no new medications (antibiotics), etc.

## 2019-06-06 NOTE — Telephone Encounter (Signed)
Patient's daughter texted me her mom's PST POC FS INR results 7:55PM 05-Jun-2019, 3.1 (goal 2.5 - 3.5). Was advised to take 1x5mg  Su/Tu/Th/Sa; 1/2 x 5mg  (2.5mg ) on M/W/F. Repeat PST FS POC INR on 20-Jun-2019. No bleeding endorsed, no new medications, etc.

## 2019-06-06 NOTE — Telephone Encounter (Signed)
Received fax from [facility] self Testing Service   INR - 3.1 ; test date 06/05/19. Thanks

## 2019-06-06 NOTE — Telephone Encounter (Signed)
Home Health Certification and Plan of Care signed and faxed to [facility].  Confirmation received.//AB/CMA

## 2019-06-08 DIAGNOSIS — Z4789 Encounter for other orthopedic aftercare: Secondary | ICD-10-CM | POA: Diagnosis not present

## 2019-06-08 DIAGNOSIS — Z4801 Encounter for change or removal of surgical wound dressing: Secondary | ICD-10-CM | POA: Diagnosis not present

## 2019-06-13 ENCOUNTER — Telehealth: Payer: Self-pay | Admitting: *Deleted

## 2019-06-13 DIAGNOSIS — Z4789 Encounter for other orthopedic aftercare: Secondary | ICD-10-CM | POA: Diagnosis not present

## 2019-06-13 DIAGNOSIS — Z4801 Encounter for change or removal of surgical wound dressing: Secondary | ICD-10-CM | POA: Diagnosis not present

## 2019-06-13 NOTE — Telephone Encounter (Signed)
Received fax from [facility] PT/INR self testing service:   INR 1.8 on 06/12/19 Send t [name].

## 2019-06-14 NOTE — Telephone Encounter (Signed)
Thank you :)

## 2019-06-19 ENCOUNTER — Telehealth: Payer: Self-pay | Admitting: *Deleted

## 2019-06-19 NOTE — Telephone Encounter (Signed)
Received Missed Visit Report on (06/08/19) via fax for review from [facility].  Given to provider.//AB/CMA

## 2019-06-20 ENCOUNTER — Telehealth: Payer: Self-pay | Admitting: Pharmacist

## 2019-06-20 ENCOUNTER — Telehealth: Payer: Self-pay | Admitting: *Deleted

## 2019-06-20 NOTE — Telephone Encounter (Signed)
Received fax from [facility] PT/INR self testing service     INR 4.5 done on 06/19/19@ 6:04 PM Thanks

## 2019-06-20 NOTE — Telephone Encounter (Signed)
I was provided results of the patient's PST FS POC INR performed on 19-Jun-2019 at 6:06PM. INR = 4.5. Daughter, [name] was advised that the 19-Jun-2019 dose was to be OMITTED, with subsequent dosing instructions to be provided today.

## 2019-06-20 NOTE — Telephone Encounter (Signed)
Daughter alerted me last evening 19-Jun-2019 at 6:06PM. Daughter [name]) was  instructed to have her mother OMIT the dose for 19-Jun-2019 (done). Subsequent dosing instructions have been provided for remainder of the week:  1/2 x 5mg  (2.5mg  dose) on Tuesday 20-Jun-2019 and Sunday 25-Jun-2019. All OTHER days this week, patient will take 1x5mg . Next INR will be 26-Jun-2019 as patient self-testing, point of care, fingerstick INR. Target goal is 2.5 - 3.5. Daughter stated last evening there was no signs or symptoms of bleeding, no new medications. Adequate supply of warfarin on-hand they report.

## 2019-06-21 ENCOUNTER — Telehealth: Payer: Self-pay | Admitting: *Deleted

## 2019-06-21 NOTE — Telephone Encounter (Signed)
Pt calls and states she has had a "stuffed up" nose for appr 1 week, some green in it. States she has COPD and is coughing stuff up. Also she states her sciatica is "acting up" States she is afraid to come to hospital now TELEHEALTH appt 1/4 at [facility]

## 2019-06-21 NOTE — Telephone Encounter (Signed)
Provider review missed visit report and signed it.//AB/CMA

## 2019-06-22 ENCOUNTER — Emergency Department (HOSPITAL_COMMUNITY): Payer: Medicare Other

## 2019-06-22 ENCOUNTER — Emergency Department (HOSPITAL_COMMUNITY)
Admission: EM | Admit: 2019-06-22 | Discharge: 2019-06-22 | Disposition: A | Discharge status: Home / Self Care | Payer: Medicare Other | Attending: Emergency Medicine | Admitting: Emergency Medicine

## 2019-06-22 ENCOUNTER — Other Ambulatory Visit: Payer: Self-pay

## 2019-06-22 ENCOUNTER — Telehealth: Payer: Self-pay | Admitting: *Deleted

## 2019-06-22 ENCOUNTER — Encounter (HOSPITAL_COMMUNITY): Payer: Self-pay | Admitting: Emergency Medicine

## 2019-06-22 DIAGNOSIS — M5431 Sciatica, right side: Secondary | ICD-10-CM

## 2019-06-22 DIAGNOSIS — M25551 Pain in right hip: Secondary | ICD-10-CM | POA: Diagnosis present

## 2019-06-22 DIAGNOSIS — Z7901 Long term (current) use of anticoagulants: Secondary | ICD-10-CM | POA: Insufficient documentation

## 2019-06-22 DIAGNOSIS — I110 Hypertensive heart disease with heart failure: Secondary | ICD-10-CM | POA: Insufficient documentation

## 2019-06-22 DIAGNOSIS — J449 Chronic obstructive pulmonary disease, unspecified: Secondary | ICD-10-CM | POA: Diagnosis not present

## 2019-06-22 DIAGNOSIS — I5042 Chronic combined systolic (congestive) and diastolic (congestive) heart failure: Secondary | ICD-10-CM | POA: Insufficient documentation

## 2019-06-22 DIAGNOSIS — Z20828 Contact with and (suspected) exposure to other viral communicable diseases: Secondary | ICD-10-CM | POA: Diagnosis not present

## 2019-06-22 DIAGNOSIS — M5441 Lumbago with sciatica, right side: Secondary | ICD-10-CM | POA: Insufficient documentation

## 2019-06-22 DIAGNOSIS — F17210 Nicotine dependence, cigarettes, uncomplicated: Secondary | ICD-10-CM | POA: Insufficient documentation

## 2019-06-22 DIAGNOSIS — J189 Pneumonia, unspecified organism: Secondary | ICD-10-CM | POA: Diagnosis not present

## 2019-06-22 DIAGNOSIS — Z79899 Other long term (current) drug therapy: Secondary | ICD-10-CM | POA: Diagnosis not present

## 2019-06-22 DIAGNOSIS — I2510 Atherosclerotic heart disease of native coronary artery without angina pectoris: Secondary | ICD-10-CM | POA: Diagnosis not present

## 2019-06-22 DIAGNOSIS — R52 Pain, unspecified: Secondary | ICD-10-CM

## 2019-06-22 LAB — SARS CORONAVIRUS 2 (TAT 6-24 HRS): SARS Coronavirus 2: NEGATIVE

## 2019-06-22 MED ORDER — ETODOLAC 300 MG PO CAPS: 300.0000 mg | ORAL_CAPSULE | Freq: Three times a day (TID) | ORAL | 0 refills | Status: DC

## 2019-06-22 MED ORDER — DOXYCYCLINE HYCLATE 100 MG PO TABS: 100.0000 mg | ORAL_TABLET | Freq: Two times a day (BID) | ORAL | 0 refills | Status: DC

## 2019-06-22 MED ORDER — HYDROCODONE-ACETAMINOPHEN 5-325 MG PO TABS
1.0000 | ORAL_TABLET | ORAL | Status: AC
  Administered 2019-06-22: 10:00:00 1 via ORAL
  Filled 2019-06-22: qty 1

## 2019-06-22 MED ORDER — PREDNISONE 10 MG (21) PO TBPK: ORAL_TABLET | ORAL | 0 refills | Status: DC

## 2019-06-22 NOTE — Discharge Instructions (Signed)
Take the medications for pain.  Please monitor for any signs of bleeding as the medication can interact with your Coumadin.  Take the antibiotics as prescribed.  Avoid contact with others.  Your Covid test should be available within 24 hours.  You can review those findings in my chart

## 2019-06-22 NOTE — ED Provider Notes (Signed)
[facility] EMERGENCY DEPARTMENT Provider Note   CSN: [id number] Arrival date & time: 06/22/19  [id number]     History Chief Complaint  Patient presents with  . Hip Pain  . Nasal Congestion    Kim Anthony is a 68 y.o. female.  HPI   Patient presents emergency room for evaluation of hip and right-sided back pain.  Patient states has been going on for couple of months now.  She has been taking medications without relief.  Patient has been to the emergency room once back in November but has not seen anyone since that time.  Patient states the pain is sharp and occasionally goes down her right leg.  She denies any fever.  No numbness or weakness.  Patient states she is also had URI symptoms for a while now.  For the last 2 weeks she has been coughing.  She has had negative outpatient Covid tests but is still concerned and requests repeat testing.  No shortness of breath.  No fevers or chills.  Past Medical History:  Diagnosis Date  . Arthritis   . Asthma   . Breast cancer ([place]) 2001   Left Breast Cancer  . CHF (congestive heart failure) ([place])   . COPD (chronic obstructive pulmonary disease) ([place])   . Coronary artery disease   . History of mitral valve replacement with mechanical valve   . HLD (hyperlipidemia)   . Hypertension   . Pulmonary embolism [facility])     Patient Active Problem List   Diagnosis Date Noted  . Pustular rash 03/09/2019  . Anemia 02/16/2019  . Breast cancer ([place])   . Acquired absence of left breast 12/20/2018  . Open wound of axillary region 05/11/2018  . COPD (chronic obstructive pulmonary disease) ([place]) 11/25/2017  . S/P MVR (mitral valve replacement)   . Chronic anticoagulation   . Tobacco use disorder 02/25/2017  . Lymphadenopathy, mediastinal 02/25/2017  . History of mitral valve replacement with mechanical valve 11/18/2016  . Essential hypertension 02/01/2016  . Allergic rhinitis 02/01/2016  . Healthcare maintenance 01/30/2016  .  Chronic low back pain 07/22/2015  . Pulmonary embolism ([place]) 07/11/2015  . Chronic combined systolic and diastolic congestive heart failure ([place]) 07/11/2015  . Headache, common migraine 07/11/2015  . HLD (hyperlipidemia) 07/11/2015    Past Surgical History:  Procedure Laterality Date  . APPLICATION OF A-CELL OF CHEST/ABDOMEN Left 01/27/2019   Procedure: APPLICATION OF A-CELL OF CHEST;  Surgeon: Wallace Going, DO;  Location: [facility];  Service: Plastics;  Laterality: Left;  . APPLICATION OF WOUND VAC N/A 01/27/2019   Procedure: APPLICATION OF WOUND VAC;  Surgeon: Wallace Going, DO;  Location: [facility];  Service: Plastics;  Laterality: N/A;  . COLONOSCOPY WITH PROPOFOL N/A 06/07/2018   Procedure: COLONOSCOPY WITH PROPOFOL;  Surgeon: Wilford Corner, MD;  Location: [facility];  Service: Endoscopy;  Laterality: N/A;  . DEBRIDEMENT AND CLOSURE WOUND Left 12/28/2018   Procedure: Debridement And Closure Wound;  Surgeon: Coralie Keens, MD;  Location: [facility];  Service: General;  Laterality: Left;  . INCISION AND DRAINAGE OF WOUND Left 01/27/2019   Procedure: IRRIGATION AND DEBRIDEMENT OF CHEST WALL;  Surgeon: Wallace Going, DO;  Location: [facility];  Service: Plastics;  Laterality: Left;  . LATISSIMUS FLAP TO BREAST Left 01/18/2019   Procedure: LEFT LATISSIMUS FLAP TO BREAST;  Surgeon: Wallace Going, DO;  Location: [facility];  Service: Plastics;  Laterality: Left;  [name] MASTECTOMY Left 2000s   for breast cancer, also  s/p radiation and chemo  . MITRAL VALVE REPLACEMENT     mechanical  . POLYPECTOMY  06/07/2018   Procedure: POLYPECTOMY;  Surgeon: Wilford Corner, MD;  Location: [facility];  Service: Endoscopy;;  . WOUND DEBRIDEMENT Left 12/28/2018   Procedure: EXCISION OF LEFT CHRONIC CHEST WALL WOUND;  Surgeon: Coralie Keens, MD;  Location: [facility];  Service: General;  Laterality: Left;     OB History    Gravida  2   Para  2   Term  2   Preterm      AB      Living          SAB      TAB      Ectopic      Multiple      Live Births  2           Family History  Problem Relation Age of Onset  . Heart attack Other   . Breast cancer Maternal Aunt 67    Social History   Tobacco Use  . Smoking status: Current Every Day Smoker    Packs/day: 0.25    Years: 30.00    Pack years: 7.50    Types: Cigarettes  . Smokeless tobacco: Never Used  . Tobacco comment: Sometimes less.  Substance Use Topics  . Alcohol use: Yes    Comment: beer- ocasional  . Drug use: No    Home Medications Prior to Admission medications   Medication Sig Start Date End Date Taking? Authorizing Provider  albuterol (PROVENTIL) (2.5 MG/3ML) 0.083% nebulizer solution Take 3 mLs (2.5 mg total) by nebulization every 6 (six) hours as needed for wheezing or shortness of breath. 11/26/17   Katherine Roan, MD  albuterol (VENTOLIN HFA) 108 (90 Base) MCG/ACT inhaler Inhale 1-2 puffs into the lungs every 6 (six) hours as needed for wheezing or shortness of breath. 12/26/18   Sid Falcon, MD  ANORO ELLIPTA 62.5-25 MCG/INH AEPB Inhale 1 puff by mouth once daily Patient taking differently: Inhale 1 puff into the lungs daily.  08/25/18   Isabelle Course, MD  Calcium-Phosphorus-Vitamin D (CITRACAL +D3 PO) Take 1 tablet by mouth 2 (two) times a day.    [provider]  diazepam (VALIUM) 2 MG tablet Take 1 tablet (2 mg total) by mouth every 12 (twelve) hours as needed for anxiety. Patient not taking: Reported on 05/23/2019 05/02/19   Scheeler, Carola Rhine, PA-C  diclofenac (VOLTAREN) 50 MG EC tablet Take 1 tablet (50 mg total) by mouth 2 (two) times daily. 05/02/19   Scheeler, Carola Rhine, PA-C  doxycycline (VIBRA-TABS) 100 MG tablet Take 1 tablet (100 mg total) by mouth 2 (two) times daily. 06/22/19   Dorie Rank, MD  etodolac (LODINE) 300 MG capsule Take 1 capsule (300 mg total) by mouth every 8 (eight) hours. 06/22/19   Dorie Rank, MD  fluticasone (FLONASE) 50 MCG/ACT nasal spray Place 2  sprays into both nostrils daily. Patient taking differently: Place 1 spray into both nostrils daily as needed for allergies.  12/03/16 04/11/26  Jule Ser, DO  furosemide (LASIX) 40 MG tablet Take 1 tablet (40 mg total) by mouth daily. 02/16/19 02/16/20  Marianna Payment, MD  gabapentin (NEURONTIN) 600 MG tablet Take 1 tablet (600 mg total) by mouth at bedtime. 02/01/19   Monica Becton, MD  HYDROcodone-acetaminophen (NORCO/VICODIN) 5-325 MG tablet Take 1-2 tablets by mouth every 4 (four) hours as needed. 06/02/19   Henderly, Britni A, PA-C  lisinopril (ZESTRIL) 10 MG  tablet Take 1 tablet (10 mg total) by mouth daily. 02/16/19 02/16/20  Marianna Payment, MD  metoprolol succinate (TOPROL-XL) 25 MG 24 hr tablet TAKE 1/2 (ONE-HALF) TABLET BY MOUTH ONCE DAILY 04/09/19   Marianna Payment, MD  polyethylene glycol (MIRALAX / GLYCOLAX) 17 g packet Take 17 g by mouth daily as needed for mild constipation. 02/01/19   Monica Becton, MD  potassium chloride SA (K-DUR) 20 MEQ tablet Take 2 tablets (40 mEq total) by mouth daily. 12/26/18   Sid Falcon, MD  predniSONE (STERAPRED UNI-PAK 21 TAB) 10 MG (21) TBPK tablet Take 6 tabs by mouth daily  for 2 days, then 5 tabs for 2 days, then 4 tabs for 2 days, then 3 tabs for 2 days, 2 tabs for 2 days, then 1 tab by mouth daily for 2 days 06/22/19   Dorie Rank, MD  senna (SENOKOT) 8.6 MG TABS tablet Take 1 tablet (8.6 mg total) by mouth 2 (two) times daily. 02/01/19   Monica Becton, MD  simvastatin (ZOCOR) 20 MG tablet Take 1 tablet by mouth once daily 06/07/19   Marianna Payment, MD  triamcinolone (KENALOG) 0.025 % ointment APPLY  OINTMENT TOPICALLY TO AFFECTED AREA TWICE DAILY 03/07/19   Dillingham, Loel Lofty, DO  warfarin (COUMADIN) 5 MG tablet Take 1 and 1/2 tablets on Sundays, Tuesdays, Thursdays and Saturdays. Mondays, Wednesdays and Fridays, take only 1 tablet. 03/06/19   Pennie Banter, RPH-CPP    Allergies    Acetaminophen-codeine, Propoxyphene, Tramadol, and  Tape  Review of Systems   Review of Systems  All other systems reviewed and are negative.   Physical Exam Updated Vital Signs BP (!) 117/93 (BP Location: Right Arm)   Pulse 77   Temp 98.3 F (36.8 C) (Oral)   Resp (!) 21   Ht 1.651 m (5\' 5" )   Wt 75 kg   SpO2 96%   BMI 27.51 kg/m   Physical Exam Vitals and nursing note reviewed.  Constitutional:      General: She is not in acute distress.    Appearance: She is well-developed.  HENT:     Head: Normocephalic and atraumatic.     Right Ear: External ear normal.     Left Ear: External ear normal.  Eyes:     General: No scleral icterus.       Right eye: No discharge.        Left eye: No discharge.     Conjunctiva/sclera: Conjunctivae normal.  Neck:     Trachea: No tracheal deviation.  Cardiovascular:     Rate and Rhythm: Normal rate and regular rhythm.  Pulmonary:     Effort: Pulmonary effort is normal. No respiratory distress.     Breath sounds: Normal breath sounds. No stridor. No wheezing or rales.  Abdominal:     General: Bowel sounds are normal. There is no distension.     Palpations: Abdomen is soft.     Tenderness: There is no abdominal tenderness. There is no guarding or rebound.  Musculoskeletal:        General: No tenderness.     Cervical back: Neck supple.     Comments: Tenderness palpation right paraspinal lumbar region and right buttock  Skin:    General: Skin is warm and dry.     Findings: No rash.     Comments: Extremities are warm and well-perfused  Neurological:     Mental Status: She is alert.     Cranial Nerves: No cranial nerve deficit (no  facial droop, extraocular movements intact, no slurred speech).     Sensory: No sensory deficit.     Motor: No abnormal muscle tone or seizure activity.     Coordination: Coordination normal.     Comments: 5 out of 5 strength bilateral lower extremities, normal sensation throughout     ED Results / Procedures / Treatments   Labs (all labs ordered are  listed, but only abnormal results are displayed) Labs Reviewed  SARS CORONAVIRUS 2 (TAT 6-24 HRS)    EKG None  Radiology DG Chest 2 View  Result Date: 06/22/2019 CLINICAL DATA:  Cough. Additional history provided: History of COPD, CHF, breast cancer. EXAM: CHEST - 2 VIEW COMPARISON:  Chest radiograph 01/28/2019 FINDINGS: Prior median sternotomy. Redemonstrated mitral valve prosthesis. Surgical clips project in the region of the left axilla. Overlying cardiac monitoring leads. Unchanged cardiomegaly. There is bilateral interstitial prominence. Additionally, there are bilateral ill-defined airspace opacities greatest within the left lung apex, left mid lung and right lung base. No evidence of pleural effusion or pneumothorax. No acute bony abnormality. IMPRESSION: Cardiomegaly Bilateral interstitial prominence. Patchy bilateral airspace opacities greatest within the left lung apex, left mid lung and right lung base. Findings may reflect pulmonary edema or multifocal pneumonia. Radiographic follow-up to resolution recommended. Electronically Signed   By: Kellie Simmering DO   On: 06/22/2019 11:09   DG Lumbar Spine Complete  Result Date: 06/22/2019 CLINICAL DATA:  Low back pain radiating to the right hip, groin and leg since 06/08/2019. No known injury. EXAM: LUMBAR SPINE - COMPLETE 4+ VIEW COMPARISON:  Plain films lumbar spine 05/15/2019. FINDINGS: No fracture or malalignment. Mild anterior endplate spurring at multiple levels is unchanged. There is facet degenerative disease at L4-5 and L5-S1. Paraspinous structures demonstrate atherosclerosis. IMPRESSION: No acute abnormality. Mild degenerative disease. Electronically Signed   By: Inge Rise M.D.   On: 06/22/2019 11:10   DG HIP UNILAT WITH PELVIS 2-3 VIEWS RIGHT  Result Date: 06/22/2019 CLINICAL DATA:  Low back pain radiating into the right groin, hip and leg since 06/08/2019. EXAM: DG HIP (WITH OR WITHOUT PELVIS) 2-3V RIGHT COMPARISON:  Plain  films the right hip 05/15/2019. FINDINGS: There is no acute bony or joint abnormality. Mild bilateral hip joint space narrowing, osteophytosis and subchondral sclerosis are symmetric from right to left and unchanged. No lytic or sclerotic lesion. Soft tissues are unremarkable. IMPRESSION: No change in mild bilateral hip osteoarthritis. No acute abnormality. Electronically Signed   By: Inge Rise M.D.   On: 06/22/2019 11:12    Procedures Procedures (including critical care time)  Medications Ordered in ED Medications  HYDROcodone-acetaminophen (NORCO/VICODIN) 5-325 MG per tablet 1 tablet (1 tablet Oral Given 06/22/19 1000)    ED Course  I have reviewed the triage vital signs and the nursing notes.  Pertinent labs & imaging results that were available during my care of the patient were reviewed by me and considered in my medical decision making (see chart for details).  Clinical Course as of Jun 22 1143  Thu Jun 22, 2019  1136 Hip and lumbar x-rays unremarkable.  Chest x-ray suggest possible multifocal pneumonia.   [JK]    Clinical Course User Index [JK] Dorie Rank, MD   MDM Rules/Calculators/A&P                      Patient presents with persistent back pain.  Symptoms are consistent with sciatica.  No neurofocal deficits.  Plan on discharge home with a course of  steroids and NSAIDs.  Patient will need to monitor closely for side effects considering her use of Coumadin.  Recommend outpatient follow-up with a spine specialist.  Patient also complained of cough.  Chest x-ray suggest the possibility of pneumonia.  I doubt CHF as her symptoms are more infectious in nature.  Patient states she has tested negative for Covid in the past but we will test her again today.  I will give her a course of doxycycline.  Patient is afebrile and breathing easily.  Appears stable for outpatient management.   Kim Anthony was evaluated in Emergency Department on 06/22/2019 for the symptoms described  in the history of present illness. She was evaluated in the context of the global COVID-19 pandemic, which necessitated consideration that the patient might be at risk for infection with the SARS-CoV-2 virus that causes COVID-19. Institutional protocols and algorithms that pertain to the evaluation of patients at risk for COVID-19 are in a state of rapid change based on information released by regulatory bodies including the CDC and federal and state organizations. These policies and algorithms were followed during the patient's care in the ED.  Final Clinical Impression(s) / ED Diagnoses Final diagnoses:  Pneumonia of both lungs due to infectious organism, unspecified part of lung  Sciatica of right side    Rx / DC Orders ED Discharge Orders         Ordered    predniSONE (STERAPRED UNI-PAK 21 TAB) 10 MG (21) TBPK tablet     06/22/19 1143    doxycycline (VIBRA-TABS) 100 MG tablet  2 times daily     06/22/19 1143    etodolac (LODINE) 300 MG capsule  Every 8 hours    Note to Pharmacy: As needed for pain   06/22/19 1143           Dorie Rank, MD 06/22/19 1148

## 2019-06-22 NOTE — Telephone Encounter (Signed)
Pharmacy called related to Rx: Lodine interaction with warfarin .Marland KitchenMarland KitchenEDCM clarified with EDP Tomi Bamberger) to dispense as written.

## 2019-06-22 NOTE — ED Triage Notes (Signed)
Pt. Stated, My sciatica is acting up since Nov. And Ive had a cold for 2 weeks.

## 2019-06-23 DIAGNOSIS — I2699 Other pulmonary embolism without acute cor pulmonale: Secondary | ICD-10-CM

## 2019-06-23 HISTORY — DX: Other pulmonary embolism without acute cor pulmonale: I26.99

## 2019-06-26 ENCOUNTER — Ambulatory Visit (INDEPENDENT_AMBULATORY_CARE_PROVIDER_SITE_OTHER): Payer: Medicare Other | Admitting: Internal Medicine

## 2019-06-26 ENCOUNTER — Other Ambulatory Visit: Payer: Self-pay

## 2019-06-26 ENCOUNTER — Telehealth: Payer: Self-pay | Admitting: *Deleted

## 2019-06-26 DIAGNOSIS — M5441 Lumbago with sciatica, right side: Secondary | ICD-10-CM

## 2019-06-26 DIAGNOSIS — J189 Pneumonia, unspecified organism: Secondary | ICD-10-CM | POA: Insufficient documentation

## 2019-06-26 DIAGNOSIS — F17210 Nicotine dependence, cigarettes, uncomplicated: Secondary | ICD-10-CM

## 2019-06-26 DIAGNOSIS — J449 Chronic obstructive pulmonary disease, unspecified: Secondary | ICD-10-CM | POA: Diagnosis not present

## 2019-06-26 DIAGNOSIS — J181 Lobar pneumonia, unspecified organism: Secondary | ICD-10-CM

## 2019-06-26 DIAGNOSIS — Z79899 Other long term (current) drug therapy: Secondary | ICD-10-CM

## 2019-06-26 DIAGNOSIS — G8929 Other chronic pain: Secondary | ICD-10-CM | POA: Diagnosis not present

## 2019-06-26 DIAGNOSIS — Z7951 Long term (current) use of inhaled steroids: Secondary | ICD-10-CM

## 2019-06-26 MED ORDER — GABAPENTIN 600 MG PO TABS: 600.0000 mg | ORAL_TABLET | Freq: Three times a day (TID) | ORAL | 2 refills | Status: DC

## 2019-06-26 MED ORDER — ALBUTEROL SULFATE (2.5 MG/3ML) 0.083% IN NEBU: 2.5000 mg | INHALATION_SOLUTION | Freq: Four times a day (QID) | RESPIRATORY_TRACT | 12 refills | Status: DC | PRN

## 2019-06-26 MED ORDER — ALBUTEROL SULFATE HFA 108 (90 BASE) MCG/ACT IN AERS: 1.0000 | INHALATION_SPRAY | Freq: Four times a day (QID) | RESPIRATORY_TRACT | 11 refills | Status: DC | PRN

## 2019-06-26 NOTE — Telephone Encounter (Signed)
Kim Anthony at [facility] notified via [employer] that order has been placed in [place] for Nebulizer with supplies.L. [place], BSN, RN-BC

## 2019-06-26 NOTE — Progress Notes (Addendum)
   CC: cough  This is a telephone encounter between Kim Anthony and Kim Anthony on 06/26/2019 for pneumonia. The visit was conducted with the patient located at home and Kim Anthony at [facility]. The patient's identity was confirmed using their DOB and current address. The patient has consented to being evaluated through a telephone encounter and understands the associated risks (an examination cannot be done and the patient may need to come in for an appointment) / benefits (allows the patient to remain at home, decreasing exposure to coronavirus). I personally spent 14 minutes on medical discussion.   HPI:  [name] is a 70 y.o. with PMH as below presenting for follow-up of recently diagnosed pneumonia and for medication refills.     Please see A&P for assessment of the patient's acute and chronic medical conditions.   Past Medical History:  Diagnosis Date  . Arthritis   . Asthma   . Breast cancer ([place]) 2001   Left Breast Cancer  . CHF (congestive heart failure) ([place])   . COPD (chronic obstructive pulmonary disease) ([place])   . Coronary artery disease   . History of mitral valve replacement with mechanical valve   . HLD (hyperlipidemia)   . Hypertension   . Pulmonary embolism ([place])    Review of Systems:   Review of Systems  Constitutional: Negative for chills, diaphoresis, fever and malaise/fatigue.  Respiratory: Positive for cough. Negative for shortness of breath and wheezing.   Cardiovascular: Negative for chest pain.  Gastrointestinal: Negative for nausea and vomiting.  Musculoskeletal: Positive for back pain. Negative for falls.  Neurological: Positive for tingling. Negative for dizziness, tremors and focal weakness.     Assessment & Plan:   See Encounters Tab for problem based charting.  Patient discussed with Dr. Philipp Ovens.

## 2019-06-26 NOTE — Assessment & Plan Note (Signed)
Her nebulizer machine broke and she is requesting a new one. Her copd is stable and controlled with her current albuterol and anoro.   - dme nebulizer  - refill albuterol

## 2019-06-26 NOTE — Assessment & Plan Note (Signed)
She went to the emergency room four days ago and was told she had pneumonia in both lungs. She was given antibiotics and is feeling much better with improved cough. She has not had fever or chills or shortness of breath. Her covid was negative. She has two days of antibiotics left.  - complete doxycy cline  - follow-up if needed  - smoking cessation discussed

## 2019-06-26 NOTE — Assessment & Plan Note (Signed)
She is taking prednisone for sciatic pain which was given to her in the ED five days ago. She was previously prescribed gabapentin but can't get it filled to the 17th because the most recent prescription was once per day. She states this has helped her pain in the past. The pain is in her right back and radiates down the back of her right leg and up by her thigh and privates and is very painful. She does not have incontinence of urine or stool. She does not have numbness or weakness. She would like a referral to orthopedics.   - referral to orthopedics  - increase gabapentin 600 mg tid

## 2019-06-26 NOTE — Progress Notes (Signed)
Internal Medicine Clinic Attending  Case discussed with Dr. Seawell at the time of the visit.  We reviewed the resident's history and exam and pertinent patient test results.  I agree with the assessment, diagnosis, and plan of care documented in the resident's note.    

## 2019-06-27 ENCOUNTER — Telehealth: Payer: Self-pay | Admitting: Pharmacist

## 2019-06-27 ENCOUNTER — Telehealth: Payer: Self-pay | Admitting: *Deleted

## 2019-06-27 NOTE — Telephone Encounter (Signed)
Thank you Dr. Elie Confer. Antibiotics and prednisone were prescribed in the ED and I did not catch that she was on warfarin yesterday. Appreciate your help.

## 2019-06-27 NOTE — Telephone Encounter (Signed)
Text received by patient's daughter at 1819h 26-Jun-2019 reporting PST, FS, POC INR = 5.4, on "prednisone and antibiotcis.," for which I was not apprised until this time. Was advised last PM to OMIT/HOLD the warfarin dose. I will empirically adjust dose DOWN.   Patient is on doxycyline and prednisone dose-pak (tapering dose). No reported bleeding. Was advised to OMIT PM dose of 26-Jun-2019 and to either drink a bottle of Lipton's Green Tea or to eat a dark-green-leafy vegetable salad. Instructions are:  OMIT dose 26-Jun-2019; Take only 1/2 of your 5 mg peach-colored tablet on Tuesday 27-Jun-2019; 1 x 5mg  tablet on Wednesday 28-Jun-2019 and 1/2 x 5 mg (2.5mg  dose) on Thursday 29-Jun-2019. Perform patient self-testing, finger-stick, point of care INR on Friday 30-Jun-2019 and provide me results.

## 2019-06-27 NOTE — Telephone Encounter (Signed)
Received fax from [facility] PT/INR self-testing service   INR 5.4 done 06/26/19 @ 6:17 PM Thanks

## 2019-06-27 NOTE — Telephone Encounter (Signed)
[place], Germain Osgood, Orvis Brill, RN; [place], Stanford Breed, [place]; [place], [place] C, [place]  Happy New Years to you all too.   Thank you for the referral.

## 2019-06-27 NOTE — Telephone Encounter (Signed)
Thank you for seeing this and the update.

## 2019-07-03 ENCOUNTER — Telehealth: Payer: Self-pay | Admitting: Pharmacist

## 2019-07-03 NOTE — Telephone Encounter (Signed)
PST on 30-Jun-2019 INR 2.3 (goal 2.5 - 3.5) but has been on antibiotics (finished on 9-JAN-21) and prednisone (finished on 02-Jul-2019). Was advised to take 5mg  MWF; 2.5mg  all other days. PST to be performed on 12-JAN-21.

## 2019-07-04 ENCOUNTER — Inpatient Hospital Stay (HOSPITAL_COMMUNITY)
Admission: EM | Admit: 2019-07-04 | Discharge: 2019-07-06 | DRG: 176 | Disposition: A | Discharge status: Home / Self Care | Payer: Medicare Other | Attending: Internal Medicine | Admitting: Internal Medicine

## 2019-07-04 ENCOUNTER — Encounter (HOSPITAL_COMMUNITY): Payer: Self-pay | Admitting: Emergency Medicine

## 2019-07-04 ENCOUNTER — Emergency Department (HOSPITAL_COMMUNITY): Payer: Medicare Other

## 2019-07-04 ENCOUNTER — Other Ambulatory Visit: Payer: Self-pay

## 2019-07-04 DIAGNOSIS — Z888 Allergy status to other drugs, medicaments and biological substances status: Secondary | ICD-10-CM

## 2019-07-04 DIAGNOSIS — I2699 Other pulmonary embolism without acute cor pulmonale: Secondary | ICD-10-CM | POA: Diagnosis present

## 2019-07-04 DIAGNOSIS — Z7901 Long term (current) use of anticoagulants: Secondary | ICD-10-CM | POA: Diagnosis not present

## 2019-07-04 DIAGNOSIS — Z853 Personal history of malignant neoplasm of breast: Secondary | ICD-10-CM

## 2019-07-04 DIAGNOSIS — M1990 Unspecified osteoarthritis, unspecified site: Secondary | ICD-10-CM | POA: Diagnosis present

## 2019-07-04 DIAGNOSIS — Z9221 Personal history of antineoplastic chemotherapy: Secondary | ICD-10-CM

## 2019-07-04 DIAGNOSIS — I10 Essential (primary) hypertension: Secondary | ICD-10-CM | POA: Diagnosis present

## 2019-07-04 DIAGNOSIS — F17210 Nicotine dependence, cigarettes, uncomplicated: Secondary | ICD-10-CM | POA: Diagnosis present

## 2019-07-04 DIAGNOSIS — I5043 Acute on chronic combined systolic (congestive) and diastolic (congestive) heart failure: Secondary | ICD-10-CM | POA: Diagnosis present

## 2019-07-04 DIAGNOSIS — F17200 Nicotine dependence, unspecified, uncomplicated: Secondary | ICD-10-CM | POA: Diagnosis present

## 2019-07-04 DIAGNOSIS — J440 Chronic obstructive pulmonary disease with acute lower respiratory infection: Secondary | ICD-10-CM | POA: Diagnosis not present

## 2019-07-04 DIAGNOSIS — Z952 Presence of prosthetic heart valve: Secondary | ICD-10-CM

## 2019-07-04 DIAGNOSIS — I50814 Right heart failure due to left heart failure: Secondary | ICD-10-CM | POA: Diagnosis present

## 2019-07-04 DIAGNOSIS — Z20822 Contact with and (suspected) exposure to covid-19: Secondary | ICD-10-CM | POA: Diagnosis present

## 2019-07-04 DIAGNOSIS — I712 Thoracic aortic aneurysm, without rupture: Secondary | ICD-10-CM

## 2019-07-04 DIAGNOSIS — J449 Chronic obstructive pulmonary disease, unspecified: Secondary | ICD-10-CM | POA: Diagnosis present

## 2019-07-04 DIAGNOSIS — I110 Hypertensive heart disease with heart failure: Secondary | ICD-10-CM | POA: Diagnosis present

## 2019-07-04 DIAGNOSIS — I5040 Unspecified combined systolic (congestive) and diastolic (congestive) heart failure: Secondary | ICD-10-CM | POA: Diagnosis present

## 2019-07-04 DIAGNOSIS — E785 Hyperlipidemia, unspecified: Secondary | ICD-10-CM | POA: Diagnosis present

## 2019-07-04 DIAGNOSIS — J441 Chronic obstructive pulmonary disease with (acute) exacerbation: Secondary | ICD-10-CM | POA: Diagnosis present

## 2019-07-04 DIAGNOSIS — Z91048 Other nonmedicinal substance allergy status: Secondary | ICD-10-CM

## 2019-07-04 DIAGNOSIS — Z8249 Family history of ischemic heart disease and other diseases of the circulatory system: Secondary | ICD-10-CM

## 2019-07-04 DIAGNOSIS — Z79899 Other long term (current) drug therapy: Secondary | ICD-10-CM

## 2019-07-04 DIAGNOSIS — R0602 Shortness of breath: Secondary | ICD-10-CM | POA: Diagnosis not present

## 2019-07-04 DIAGNOSIS — Z9012 Acquired absence of left breast and nipple: Secondary | ICD-10-CM

## 2019-07-04 DIAGNOSIS — I2510 Atherosclerotic heart disease of native coronary artery without angina pectoris: Secondary | ICD-10-CM | POA: Diagnosis present

## 2019-07-04 DIAGNOSIS — Z885 Allergy status to narcotic agent status: Secondary | ICD-10-CM

## 2019-07-04 DIAGNOSIS — Z923 Personal history of irradiation: Secondary | ICD-10-CM

## 2019-07-04 DIAGNOSIS — Z7952 Long term (current) use of systemic steroids: Secondary | ICD-10-CM

## 2019-07-04 DIAGNOSIS — I5042 Chronic combined systolic (congestive) and diastolic (congestive) heart failure: Secondary | ICD-10-CM | POA: Diagnosis not present

## 2019-07-04 DIAGNOSIS — Z86711 Personal history of pulmonary embolism: Secondary | ICD-10-CM

## 2019-07-04 DIAGNOSIS — Z803 Family history of malignant neoplasm of breast: Secondary | ICD-10-CM

## 2019-07-04 DIAGNOSIS — R079 Chest pain, unspecified: Secondary | ICD-10-CM | POA: Diagnosis not present

## 2019-07-04 DIAGNOSIS — I7121 Aneurysm of the ascending aorta, without rupture: Secondary | ICD-10-CM

## 2019-07-04 LAB — BASIC METABOLIC PANEL
Anion gap: 8 (ref 5–15)
BUN: 18 mg/dL (ref 8–23)
CO2: 30 mmol/L (ref 22–32)
Calcium: 8.6 mg/dL — ABNORMAL LOW (ref 8.9–10.3)
Chloride: 103 mmol/L (ref 98–111)
Creatinine, Ser: 0.81 mg/dL (ref 0.44–1.00)
GFR calc Af Amer: >60 mL/min (ref >60)
GFR calc non Af Amer: >60 mL/min (ref >60)
Glucose, Bld: 94 mg/dL (ref 70–99)
Potassium: 4.8 mmol/L (ref 3.5–5.1)
Sodium: 141 mmol/L (ref 135–145)

## 2019-07-04 LAB — CBC
HCT: 40 % (ref 36.0–46.0)
Hemoglobin: 12.3 g/dL (ref 12.0–15.0)
MCH: 32.1 pg (ref 26.0–34.0)
MCHC: 30.8 g/dL (ref 30.0–36.0)
MCV: 104.4 fL — ABNORMAL HIGH (ref 80.0–100.0)
Platelets: 403 10*3/uL — ABNORMAL HIGH (ref 150–400)
RBC: 3.83 MIL/uL — ABNORMAL LOW (ref 3.87–5.11)
RDW: 13.2 % (ref 11.5–15.5)
WBC: 12.3 10*3/uL — ABNORMAL HIGH (ref 4.0–10.5)
nRBC: 0 % (ref 0.0–0.2)

## 2019-07-04 LAB — PROTIME-INR
INR: 1.8 — ABNORMAL HIGH (ref 0.8–1.2)
Prothrombin Time: 20.7 seconds — ABNORMAL HIGH (ref 11.4–15.2)

## 2019-07-04 LAB — TROPONIN I (HIGH SENSITIVITY)
Troponin I (High Sensitivity): 12 ng/L (ref <18)
Troponin I (High Sensitivity): 11 ng/L (ref <18)

## 2019-07-04 LAB — BRAIN NATRIURETIC PEPTIDE: B Natriuretic Peptide: 178.3 pg/mL — ABNORMAL HIGH (ref 0.0–100.0)

## 2019-07-04 LAB — HEPARIN LEVEL (UNFRACTIONATED): Heparin Unfractionated: 0.74 IU/mL — ABNORMAL HIGH (ref 0.30–0.70)

## 2019-07-04 MED ORDER — ALBUTEROL SULFATE HFA 108 (90 BASE) MCG/ACT IN AERS: 1.0000 | INHALATION_SPRAY | Freq: Four times a day (QID) | RESPIRATORY_TRACT | Status: DC | PRN

## 2019-07-04 MED ORDER — METOPROLOL SUCCINATE ER 25 MG PO TB24
12.5000 mg | ORAL_TABLET | Freq: Every day | ORAL | Status: DC
  Administered 2019-07-04 – 2019-07-06 (×3): 12.5 mg via ORAL
  Filled 2019-07-04 (×3): qty 1

## 2019-07-04 MED ORDER — FUROSEMIDE 40 MG PO TABS
40.0000 mg | ORAL_TABLET | Freq: Every day | ORAL | Status: DC
  Administered 2019-07-04 – 2019-07-06 (×3): 40 mg via ORAL
  Filled 2019-07-04: qty 2
  Filled 2019-07-04 (×2): qty 1

## 2019-07-04 MED ORDER — ONDANSETRON HCL 4 MG/2ML IJ SOLN
4.0000 mg | Freq: Once | INTRAMUSCULAR | Status: AC
  Administered 2019-07-04: 4 mg via INTRAVENOUS
  Filled 2019-07-04: qty 2

## 2019-07-04 MED ORDER — METHYLPREDNISOLONE SODIUM SUCC 125 MG IJ SOLR
125.0000 mg | Freq: Once | INTRAMUSCULAR | Status: AC
  Administered 2019-07-04: 125 mg via INTRAVENOUS
  Filled 2019-07-04: qty 2

## 2019-07-04 MED ORDER — HEPARIN BOLUS VIA INFUSION
5000.0000 [IU] | Freq: Once | INTRAVENOUS | Status: AC
  Administered 2019-07-04: 5000 [IU] via INTRAVENOUS
  Filled 2019-07-04: qty 5000

## 2019-07-04 MED ORDER — SODIUM CHLORIDE 0.9% FLUSH
3.0000 mL | Freq: Once | INTRAVENOUS | Status: AC
  Administered 2019-07-04: 3 mL via INTRAVENOUS

## 2019-07-04 MED ORDER — LISINOPRIL 10 MG PO TABS
10.0000 mg | ORAL_TABLET | Freq: Every day | ORAL | Status: DC
  Administered 2019-07-04 – 2019-07-06 (×3): 10 mg via ORAL
  Filled 2019-07-04 (×3): qty 1

## 2019-07-04 MED ORDER — WARFARIN - PHARMACIST DOSING INPATIENT: Freq: Every day | Status: DC

## 2019-07-04 MED ORDER — LABETALOL HCL 5 MG/ML IV SOLN: 5.0000 mg | INTRAVENOUS | Status: DC | PRN

## 2019-07-04 MED ORDER — ALBUTEROL SULFATE (2.5 MG/3ML) 0.083% IN NEBU: 2.5000 mg | INHALATION_SOLUTION | Freq: Four times a day (QID) | RESPIRATORY_TRACT | Status: DC | PRN

## 2019-07-04 MED ORDER — SIMVASTATIN 20 MG PO TABS
20.0000 mg | ORAL_TABLET | Freq: Every day | ORAL | Status: DC
  Administered 2019-07-04 – 2019-07-06 (×3): 20 mg via ORAL
  Filled 2019-07-04 (×3): qty 1

## 2019-07-04 MED ORDER — LABETALOL HCL 5 MG/ML IV SOLN: 10.0000 mg | INTRAVENOUS | Status: DC | PRN

## 2019-07-04 MED ORDER — ALBUTEROL SULFATE HFA 108 (90 BASE) MCG/ACT IN AERS
2.0000 | INHALATION_SPRAY | Freq: Once | RESPIRATORY_TRACT | Status: DC
  Filled 2019-07-04: qty 6.7

## 2019-07-04 MED ORDER — ETODOLAC 300 MG PO CAPS: 300.0000 mg | ORAL_CAPSULE | Freq: Three times a day (TID) | ORAL | Status: DC

## 2019-07-04 MED ORDER — KETOROLAC TROMETHAMINE 15 MG/ML IJ SOLN
15.0000 mg | Freq: Four times a day (QID) | INTRAMUSCULAR | Status: DC | PRN
  Administered 2019-07-04: 15 mg via INTRAVENOUS
  Filled 2019-07-04: qty 1

## 2019-07-04 MED ORDER — HEPARIN (PORCINE) 25000 UT/250ML-% IV SOLN
1100.0000 [IU]/h | INTRAVENOUS | Status: AC
  Administered 2019-07-04: 1300 [IU]/h via INTRAVENOUS
  Administered 2019-07-05: 1250 [IU]/h via INTRAVENOUS
  Filled 2019-07-04 (×2): qty 250

## 2019-07-04 MED ORDER — GABAPENTIN 600 MG PO TABS
600.0000 mg | ORAL_TABLET | Freq: Three times a day (TID) | ORAL | Status: DC
  Administered 2019-07-04 – 2019-07-06 (×6): 600 mg via ORAL
  Filled 2019-07-04 (×7): qty 1

## 2019-07-04 MED ORDER — UMECLIDINIUM-VILANTEROL 62.5-25 MCG/INH IN AEPB
1.0000 | INHALATION_SPRAY | Freq: Every day | RESPIRATORY_TRACT | Status: DC
  Filled 2019-07-04: qty 14

## 2019-07-04 MED ORDER — WARFARIN SODIUM 5 MG PO TABS
5.0000 mg | ORAL_TABLET | Freq: Once | ORAL | Status: DC
  Filled 2019-07-04: qty 1

## 2019-07-04 MED ORDER — IOHEXOL 350 MG/ML SOLN
100.0000 mL | Freq: Once | INTRAVENOUS | Status: AC | PRN
  Administered 2019-07-04: 80 mL via INTRAVENOUS

## 2019-07-04 MED ORDER — WARFARIN SODIUM 7.5 MG PO TABS
7.5000 mg | ORAL_TABLET | Freq: Once | ORAL | Status: AC
  Administered 2019-07-04: 7.5 mg via ORAL
  Filled 2019-07-04: qty 1

## 2019-07-04 MED ORDER — HYDRALAZINE HCL 20 MG/ML IJ SOLN: 10.0000 mg | INTRAMUSCULAR | Status: DC | PRN

## 2019-07-04 MED ORDER — DIAZEPAM 2 MG PO TABS
2.0000 mg | ORAL_TABLET | Freq: Two times a day (BID) | ORAL | Status: DC | PRN
  Administered 2019-07-04 – 2019-07-06 (×4): 2 mg via ORAL
  Filled 2019-07-04 (×4): qty 1

## 2019-07-04 MED ORDER — MORPHINE SULFATE (PF) 4 MG/ML IV SOLN
4.0000 mg | Freq: Once | INTRAVENOUS | Status: AC
  Administered 2019-07-04: 4 mg via INTRAVENOUS
  Filled 2019-07-04: qty 1

## 2019-07-04 NOTE — ED Triage Notes (Signed)
Patient reports central chest pain / tightness with SOB , productive cough onset yesterday , denies emesis or diaphoresis , no fever or chills . History of CAD/PE/Valvular heart disease.

## 2019-07-04 NOTE — ED Provider Notes (Signed)
[facility] EMERGENCY DEPARTMENT Provider Note   CSN: [id number] Arrival date & time: 07/04/19  [id number]     History Chief Complaint  Patient presents with  . Chest Pain     HPI   Blood pressure 134/87, pulse 76, temperature 98.2 F (36.8 C), temperature source Oral, resp. rate 20, SpO2 99 %.  Kim Anthony is a 69 y.o. female with past medical history significant for breast cancer in remission, CHF, COPD, CAD, pulmonary embolism on Coumadin complaining of left-sided nonradiating chest pain associated with cough and shortness of breath onset 2 days ago.  The pain is achiness, 8 out of 10, no alleviating or exacerbating factors can be identified.  She was recently seen for multifocal pneumonia and had some shortness of breath with this but never had any chest pain.  Patient had a negative COVID-19 test at that time.  Patient volunteers that this feels like her prior PE.  She denies any increasing peripheral edema, calf pain, leg swelling.  No recent immobilizations.  She has been cleared from cancer as far she knows.  She does not regularly follow with cardiology.  She denies diabetes but she has smoked for 30 years 1 pack every 3 days.     Past Medical History:  Diagnosis Date  . Arthritis   . Asthma   . Breast cancer ([place]) 2001   Left Breast Cancer  . CHF (congestive heart failure) ([place])   . COPD (chronic obstructive pulmonary disease) ([place])   . Coronary artery disease   . History of mitral valve replacement with mechanical valve   . HLD (hyperlipidemia)   . Hypertension   . Pulmonary embolism [facility])     Patient Active Problem List   Diagnosis Date Noted  . Bilateral pneumonia 06/26/2019  . Pustular rash 03/09/2019  . Anemia 02/16/2019  . Breast cancer ([place])   . Acquired absence of left breast 12/20/2018  . Open wound of axillary region 05/11/2018  . COPD (chronic obstructive pulmonary disease) ([place]) 11/25/2017  . S/P MVR (mitral valve replacement)   .  Chronic anticoagulation   . Tobacco use disorder 02/25/2017  . Lymphadenopathy, mediastinal 02/25/2017  . History of mitral valve replacement with mechanical valve 11/18/2016  . Essential hypertension 02/01/2016  . Allergic rhinitis 02/01/2016  . Healthcare maintenance 01/30/2016  . Chronic low back pain 07/22/2015  . Pulmonary embolism ([place]) 07/11/2015  . Chronic combined systolic and diastolic congestive heart failure ([place]) 07/11/2015  . Headache, common migraine 07/11/2015  . HLD (hyperlipidemia) 07/11/2015    Past Surgical History:  Procedure Laterality Date  . APPLICATION OF A-CELL OF CHEST/ABDOMEN Left 01/27/2019   Procedure: APPLICATION OF A-CELL OF CHEST;  Surgeon: Wallace Going, DO;  Location: [place];  Service: Plastics;  Laterality: Left;  . APPLICATION OF WOUND VAC N/A 01/27/2019   Procedure: APPLICATION OF WOUND VAC;  Surgeon: Wallace Going, DO;  Location: [place];  Service: Plastics;  Laterality: N/A;  . COLONOSCOPY WITH PROPOFOL N/A 06/07/2018   Procedure: COLONOSCOPY WITH PROPOFOL;  Surgeon: Wilford Corner, MD;  Location: [facility];  Service: Endoscopy;  Laterality: N/A;  . DEBRIDEMENT AND CLOSURE WOUND Left 12/28/2018   Procedure: Debridement And Closure Wound;  Surgeon: Coralie Keens, MD;  Location: [place];  Service: General;  Laterality: Left;  . INCISION AND DRAINAGE OF WOUND Left 01/27/2019   Procedure: IRRIGATION AND DEBRIDEMENT OF CHEST WALL;  Surgeon: Wallace Going, DO;  Location: [place];  Service: Plastics;  Laterality: Left;  .  LATISSIMUS FLAP TO BREAST Left 01/18/2019   Procedure: LEFT LATISSIMUS FLAP TO BREAST;  Surgeon: Wallace Going, DO;  Location: [place];  Service: Plastics;  Laterality: Left;  [name] MASTECTOMY Left 2000s   for breast cancer, also s/p radiation and chemo  . MITRAL VALVE REPLACEMENT     mechanical  . POLYPECTOMY  06/07/2018   Procedure: POLYPECTOMY;  Surgeon: Wilford Corner, MD;  Location: [facility];  Service:  Endoscopy;;  . WOUND DEBRIDEMENT Left 12/28/2018   Procedure: EXCISION OF LEFT CHRONIC CHEST WALL WOUND;  Surgeon: Coralie Keens, MD;  Location: [place];  Service: General;  Laterality: Left;     OB History    Gravida  2   Para  2   Term  2   Preterm      AB      Living        SAB      TAB      Ectopic      Multiple      Live Births  2           Family History  Problem Relation Age of Onset  . Heart attack Other   . Breast cancer Maternal Aunt 68    Social History   Tobacco Use  . Smoking status: Current Every Day Smoker    Packs/day: 0.25    Years: 30.00    Pack years: 7.50    Types: Cigarettes  . Smokeless tobacco: Never Used  . Tobacco comment: Sometimes less.  Substance Use Topics  . Alcohol use: Yes    Comment: beer- ocasional  . Drug use: No    Home Medications Prior to Admission medications   Medication Sig Start Date End Date Taking? Authorizing Provider  albuterol (PROVENTIL) (2.5 MG/3ML) 0.083% nebulizer solution Take 3 mLs (2.5 mg total) by nebulization every 6 (six) hours as needed for wheezing or shortness of breath. 06/26/19  Yes Seawell, Jaimie A, DO  albuterol (VENTOLIN HFA) 108 (90 Base) MCG/ACT inhaler Inhale 1-2 puffs into the lungs every 6 (six) hours as needed for wheezing or shortness of breath. 06/26/19  Yes Seawell, Jaimie A, DO  ANORO ELLIPTA 62.5-25 MCG/INH AEPB Inhale 1 puff by mouth once daily Patient taking differently: Inhale 1 puff into the lungs daily.  08/25/18  Yes Isabelle Course, MD  Calcium-Phosphorus-Vitamin D (CITRACAL +D3 PO) Take 1 tablet by mouth 2 (two) times a day.   Yes [provider]  fluticasone (FLONASE) 50 MCG/ACT nasal spray Place 2 sprays into both nostrils daily. Patient taking differently: Place 1 spray into both nostrils daily as needed for allergies.  12/03/16 04/11/26 Yes Jule Ser, DO  furosemide (LASIX) 40 MG tablet Take 1 tablet (40 mg total) by mouth daily. 02/16/19 02/16/20 Yes Marianna Payment, MD  gabapentin (NEURONTIN) 600 MG tablet Take 1 tablet (600 mg total) by mouth 3 (three) times daily. 06/26/19  Yes Seawell, Jaimie A, DO  lisinopril (ZESTRIL) 10 MG tablet Take 1 tablet (10 mg total) by mouth daily. 02/16/19 02/16/20 Yes Marianna Payment, MD  metoprolol succinate (TOPROL-XL) 25 MG 24 hr tablet TAKE 1/2 (ONE-HALF) TABLET BY MOUTH ONCE DAILY Patient taking differently: Take 12.5 mg by mouth daily. TAKE 1/2 (ONE-HALF) TABLET BY MOUTH ONCE DAILY 04/09/19  Yes Marianna Payment, MD  polyethylene glycol (MIRALAX / GLYCOLAX) 17 g packet Take 17 g by mouth daily as needed for mild constipation. 02/01/19  Yes Vasireddy, Grier Mitts, MD  potassium chloride SA (K-DUR) 20 MEQ tablet  Take 2 tablets (40 mEq total) by mouth daily. 12/26/18  Yes Sid Falcon, MD  simvastatin (ZOCOR) 20 MG tablet Take 1 tablet by mouth once daily 06/07/19  Yes Marianna Payment, MD  warfarin (COUMADIN) 5 MG tablet Take 1 and 1/2 tablets on Sundays, Tuesdays, Thursdays and Saturdays. Mondays, Wednesdays and Fridays, take only 1 tablet. Patient taking differently: Take 2.5-5 mg by mouth See admin instructions. Directed by clinic weekly. 03/06/19  Yes Pennie Banter, RPH-CPP  diazepam (VALIUM) 2 MG tablet Take 1 tablet (2 mg total) by mouth every 12 (twelve) hours as needed for anxiety. Patient not taking: Reported on 05/23/2019 05/02/19   Scheeler, Carola Rhine, PA-C  diclofenac (VOLTAREN) 50 MG EC tablet Take 1 tablet (50 mg total) by mouth 2 (two) times daily. Patient not taking: Reported on 07/04/2019 05/02/19   Scheeler, Carola Rhine, PA-C  doxycycline (VIBRA-TABS) 100 MG tablet Take 1 tablet (100 mg total) by mouth 2 (two) times daily. 06/22/19   Dorie Rank, MD  etodolac (LODINE) 300 MG capsule Take 1 capsule (300 mg total) by mouth every 8 (eight) hours. Patient not taking: Reported on 07/04/2019 06/22/19   Dorie Rank, MD  HYDROcodone-acetaminophen (NORCO/VICODIN) 5-325 MG tablet Take 1-2 tablets by mouth every 4 (four) hours as  needed. Patient not taking: Reported on 07/04/2019 06/02/19   Henderly, Britni A, PA-C  predniSONE (STERAPRED UNI-PAK 21 TAB) 10 MG (21) TBPK tablet Take 6 tabs by mouth daily  for 2 days, then 5 tabs for 2 days, then 4 tabs for 2 days, then 3 tabs for 2 days, 2 tabs for 2 days, then 1 tab by mouth daily for 2 days 06/22/19   Dorie Rank, MD  senna (SENOKOT) 8.6 MG TABS tablet Take 1 tablet (8.6 mg total) by mouth 2 (two) times daily. Patient not taking: Reported on 07/04/2019 02/01/19   Monica Becton, MD  triamcinolone (KENALOG) 0.025 % ointment APPLY  OINTMENT TOPICALLY TO AFFECTED AREA TWICE DAILY Patient not taking: Reported on 07/04/2019 03/07/19   Dillingham, Loel Lofty, DO    Allergies    Acetaminophen-codeine, Propoxyphene, Tape, and Tramadol  Review of Systems   Review of Systems  A complete review of systems was obtained and all systems are negative except as noted in the HPI and PMH.    Physical Exam Updated Vital Signs BP (!) 157/100   Pulse 75   Temp 98.2 F (36.8 C) (Oral)   Resp 19   Ht 5\' 5"  (1.651 m)   Wt 75 kg   SpO2 91%   BMI 27.51 kg/m   Physical Exam Vitals and nursing note reviewed.  Constitutional:      General: She is not in acute distress.    Appearance: She is well-developed. She is not diaphoretic.  HENT:     Head: Normocephalic and atraumatic.  Eyes:     Conjunctiva/sclera: Conjunctivae normal.     Pupils: Pupils are equal, round, and reactive to light.  Neck:     Vascular: No JVD.     Trachea: No tracheal deviation.  Cardiovascular:     Rate and Rhythm: Normal rate and regular rhythm.     Comments: Radial pulse equal bilaterally Pulmonary:     Effort: Pulmonary effort is normal. No respiratory distress.     Breath sounds: Normal breath sounds. No stridor. No wheezing or rales.     Comments: Mastectomy on the left, surgical scar which she states was secondary to pain relief healing abscess that required a  skin graft per patient. Chest:      Chest wall: No tenderness.  Abdominal:     General: There is no distension.     Palpations: Abdomen is soft. There is no mass.     Tenderness: There is no abdominal tenderness. There is no guarding or rebound.  Musculoskeletal:        General: No tenderness. Normal range of motion.     Cervical back: Normal range of motion.     Comments: No calf asymmetry, superficial collaterals, palpable cords, edema, Homans sign negative bilaterally.    Skin:    General: Skin is warm.  Neurological:     Mental Status: She is alert and oriented to person, place, and time.     ED Results / Procedures / Treatments   Labs (all labs ordered are listed, but only abnormal results are displayed) Labs Reviewed  BASIC METABOLIC PANEL - Abnormal; Notable for the following components:      Result Value   Calcium 8.6 (*)    All other components within normal limits  CBC - Abnormal; Notable for the following components:   WBC 12.3 (*)    RBC 3.83 (*)    MCV 104.4 (*)    Platelets 403 (*)    All other components within normal limits  PROTIME-INR - Abnormal; Notable for the following components:   Prothrombin Time 20.7 (*)    INR 1.8 (*)    All other components within normal limits  BRAIN NATRIURETIC PEPTIDE - Abnormal; Notable for the following components:   B Natriuretic Peptide 178.3 (*)    All other components within normal limits  HEPARIN LEVEL (UNFRACTIONATED)  TROPONIN I (HIGH SENSITIVITY)  TROPONIN I (HIGH SENSITIVITY)    EKG EKG Interpretation  Date/Time:  Tuesday July 04 2019 06:57:57 EST Ventricular Rate:  73 PR Interval:  152 QRS Duration: 124 QT Interval:  458 QTC Calculation: 504 R Axis:   -9 Text Interpretation: Normal sinus rhythm RSR' or QR pattern in V1 suggests right ventricular conduction delay ST & T wave abnormality, consider lateral ischemia Abnormal ECG No significant change since last tracing Confirmed by Davonna Belling [phone]) on 07/04/2019 10:12:43  AM   Radiology DG Chest 2 View  Result Date: 07/04/2019 CLINICAL DATA:  Shortness of breath.  Chest pain. EXAM: CHEST - 2 VIEW COMPARISON:  06/22/2019 FINDINGS: Cardiomegaly. Mitral valve replacement. Hazy density over the left mid chest from radiation fibrosis by most recent chest CT. There is generalized interstitial coarsening correlating with emphysema. There is no edema, consolidation, effusion, or pneumothorax. IMPRESSION: 1. No acute finding. 2. Chronic bronchitic markings, left-sided radiation fibrosis, and cardiomegaly. Electronically Signed   By: Monte Fantasia M.D.   On: 07/04/2019 07:37   CT Angio Chest PE W and/or Wo Contrast  Result Date: 07/04/2019 CLINICAL DATA:  Shortness of breath and chest pain. Cough. History of breast carcinoma EXAM: CT ANGIOGRAPHY CHEST WITH CONTRAST TECHNIQUE: Multidetector CT imaging of the chest was performed using the standard protocol during bolus administration of intravenous contrast. Multiplanar CT image reconstructions and MIPs were obtained to evaluate the vascular anatomy. CONTRAST:  38mL OMNIPAQUE IOHEXOL 350 MG/ML SOLN COMPARISON:  Chest CT January 26, 2019; chest radiograph July 04, 2019. Prior chest CT June 08, 2018 FINDINGS: Cardiovascular: There is a focal pulmonary embolus in the apical segment of the left upper lobe pulmonary artery. No other pulmonary emboli are identified. There is generalized cardiomegaly. The right ventricle is somewhat prominent, likely due to chronic mitral valve  disease. Note that there is a mitral valve replacement present. It is doubtful that right ventricle prominence is due to right heart strain secondary to this single small pulmonary embolus. Ascending thoracic aortic diameter measures 4.1 x 4.0 cm. No dissection is evident in the aorta. Note that the contrast bolus in the aorta is not sufficient to allow for exclusion of dissection as a potential differential consideration. There are occasional foci of  calcification in the visualized great vessels. Note that the right innominate and left common carotid arteries arise as a common trunk. There are foci of aortic atherosclerosis there are foci of coronary artery calcification. There is no pericardial effusion. There is localized pericardial thickening and calcification at the left apex which may be secondary to radiation therapy change in this area. This area appears stable. The main pulmonary outflow tract measures 3.2 cm in diameter, prominent. Mediastinum/Nodes: No thyroid lesions are evident. There are subcentimeter mediastinal lymph nodes. No adenopathy is evident by size criteria on this study. No appreciable esophageal lesion evident. Lungs/Pleura: There is underlying centrilobular emphysematous change. There is pleural thickening along the anterior left hemithorax with areas of calcification which in part may be due to post radiation therapy change in in part may be due to previous asbestos exposure. There is a stable masslike area with fibrosis in the lateral left apex which is stable and likely in large part due to radiation therapy change. Note that this masslike area of fibrotic type change in the left apex measures 2.8 x 2.5 cm. There is no edema or consolidation. No pleural effusion evident. Upper Abdomen: There is reflux of contrast into the inferior vena cava and hepatic veins. There is upper abdominal aortic atherosclerosis. Visualized upper abdominal structures otherwise appear unremarkable. Musculoskeletal: Patient is status post median sternotomy. No blastic or lytic bone lesions are evident. No chest wall lesions are evident. Review of the MIP images confirms the above findings. IMPRESSION: 1. There is a focal pulmonary embolus in the apical segment left upper lobe pulmonary artery. No other pulmonary emboli evident. Heart is enlarged. Prominence of the right ventricle is felt to most likely represent sequela of prior mitral valve disease. Note  that patient is status post mitral valve replacement. There is not felt to be right heart strain secondary to pulmonary embolus. 2. Prominence of the ascending thoracic aorta measuring 4.1 x 4.0 cm in diameter. Recommend annual imaging followup by CTA or MRA. This recommendation follows 2010 ACCF/AHA/AATS/ACR/ASA/SCA/SCAI/SIR/STS/SVM Guidelines for the Diagnosis and Management of Patients with Thoracic Aortic Disease. Circulation. 2010; [id number]. Aortic aneurysm NOS (ICD10-I71.9). No dissection evident with proviso that the contrast bolus is not sufficient to confidently exclude dissection is a potential consideration. There are foci of aortic atherosclerosis. There are foci of coronary artery calcification. 3. Apparent radiation therapy change on the left with masslike area of fibrosis in the left apex laterally, stable. There is lateral pleural thickening on the left with calcification. This calcification may be secondary to radiation therapy change. Previous asbestos exposure could present similarly. There is also localized pleural thickening and calcification at the left apex, likely secondary to radiation therapy change. 4.  Underlying emphysematous change. 5.  No appreciable adenopathy. 6. Reflux of contrast into the inferior vena cava and hepatic veins, a finding most likely indicative of a degree of increased right heart pressure. Aortic Atherosclerosis (ICD10-I70.0) and Emphysema (ICD10-J43.9). Critical Value/emergent results were called by telephone at the time of interpretation on 07/04/2019 at 2:24 pm to [place] , who  verbally acknowledged these results. Electronically Signed   By: Lowella Grip III M.D.   On: 07/04/2019 14:24    Procedures Procedures (including critical care time)  Medications Ordered in ED Medications  albuterol (VENTOLIN HFA) 108 (90 Base) MCG/ACT inhaler 2 puff (2 puffs Inhalation Not Given 07/04/19 1427)  heparin bolus via infusion 5,000 Units (has  no administration in time range)  heparin ADULT infusion 100 units/mL (25000 units/249mL sodium chloride 0.45%) (has no administration in time range)  lisinopril (ZESTRIL) tablet 10 mg (has no administration in time range)  metoprolol succinate (TOPROL-XL) 24 hr tablet 12.5 mg (has no administration in time range)  diazepam (VALIUM) tablet 2 mg (has no administration in time range)  etodolac (LODINE) capsule 300 mg (has no administration in time range)  albuterol (PROVENTIL) (2.5 MG/3ML) 0.083% nebulizer solution 2.5 mg (has no administration in time range)  umeclidinium-vilanterol (ANORO ELLIPTA) 62.5-25 MCG/INH 1 puff (has no administration in time range)  furosemide (LASIX) tablet 40 mg (has no administration in time range)  gabapentin (NEURONTIN) tablet 600 mg (has no administration in time range)  simvastatin (ZOCOR) tablet 20 mg (has no administration in time range)  hydrALAZINE (APRESOLINE) injection 10 mg (has no administration in time range)  sodium chloride flush (NS) 0.9 % injection 3 mL (3 mLs Intravenous Given 07/04/19 1335)  methylPREDNISolone sodium succinate (SOLU-MEDROL) 125 mg/2 mL injection 125 mg (125 mg Intravenous Given 07/04/19 1333)  morphine 4 MG/ML injection 4 mg (4 mg Intravenous Given 07/04/19 1337)  ondansetron (ZOFRAN) injection 4 mg (4 mg Intravenous Given 07/04/19 1337)  iohexol (OMNIPAQUE) 350 MG/ML injection 100 mL (80 mLs Intravenous Contrast Given 07/04/19 1401)    ED Course  I have reviewed the triage vital signs and the nursing notes.  Pertinent labs & imaging results that were available during my care of the patient were reviewed by me and considered in my medical decision making (see chart for details).    MDM Rules/Calculators/A&P                      Vitals:   07/04/19 1430 07/04/19 1445 07/04/19 1500 07/04/19 1515  BP: (!) 153/86 (!) 159/107 (!) 165/102 (!) 157/100  Pulse: 67 74 73 75  Resp: 19 19 19 19   Temp:      TempSrc:      SpO2: 91% 96%  92% 91%  Weight:  75 kg    Height:  5\' 5"  (1.651 m)      Medications  albuterol (VENTOLIN HFA) 108 (90 Base) MCG/ACT inhaler 2 puff (2 puffs Inhalation Not Given 07/04/19 1427)  heparin bolus via infusion 5,000 Units (has no administration in time range)  heparin ADULT infusion 100 units/mL (25000 units/272mL sodium chloride 0.45%) (has no administration in time range)  lisinopril (ZESTRIL) tablet 10 mg (has no administration in time range)  metoprolol succinate (TOPROL-XL) 24 hr tablet 12.5 mg (has no administration in time range)  diazepam (VALIUM) tablet 2 mg (has no administration in time range)  etodolac (LODINE) capsule 300 mg (has no administration in time range)  albuterol (PROVENTIL) (2.5 MG/3ML) 0.083% nebulizer solution 2.5 mg (has no administration in time range)  umeclidinium-vilanterol (ANORO ELLIPTA) 62.5-25 MCG/INH 1 puff (has no administration in time range)  furosemide (LASIX) tablet 40 mg (has no administration in time range)  gabapentin (NEURONTIN) tablet 600 mg (has no administration in time range)  simvastatin (ZOCOR) tablet 20 mg (has no administration in time range)  hydrALAZINE (APRESOLINE) injection 10  mg (has no administration in time range)  sodium chloride flush (NS) 0.9 % injection 3 mL (3 mLs Intravenous Given 07/04/19 1335)  methylPREDNISolone sodium succinate (SOLU-MEDROL) 125 mg/2 mL injection 125 mg (125 mg Intravenous Given 07/04/19 1333)  morphine 4 MG/ML injection 4 mg (4 mg Intravenous Given 07/04/19 1337)  ondansetron (ZOFRAN) injection 4 mg (4 mg Intravenous Given 07/04/19 1337)  iohexol (OMNIPAQUE) 350 MG/ML injection 100 mL (80 mLs Intravenous Contrast Given 07/04/19 1401)    Kim Anthony is 69 y.o. female presenting with left-sided chest pain and some shortness of breath, feels like prior.  Chest x-ray, EKG reassuring.  Blood work unremarkable.  Verbal report from radiologist that patient does have a single left-sided PE.  She is already anticoagulated  with Coumadin, INR is 1.8, will put her in for heparin by a pharmacy consult.  Patient admitted to internal medicine teaching service.   Final Clinical Impression(s) / ED Diagnoses Final diagnoses:  PE (pulmonary thromboembolism) ([place])  Chronic anticoagulation    Rx / DC Orders ED Discharge Orders    None       Karen Kays Charna Elizabeth 07/04/19 1609    Davonna Belling, MD 07/05/19 [phone]

## 2019-07-04 NOTE — Progress Notes (Signed)
ANTICOAGULATION CONSULT NOTE - Follow Up Consult  Pharmacy Consult for Heparin Indication: hx of PE (2017) and MVR  Allergies  Allergen Reactions  . Acetaminophen-Codeine Itching  . Propoxyphene Itching    Darvocet  . Tape Rash    Plastic  . Tramadol Itching and Nausea And Vomiting    Patient Measurements: Height: 5\' 5"  (165.1 cm) Weight: 165 lb 5.5 oz (75 kg) IBW/kg (Calculated) : 57 Heparin Dosing Weight:   Vital Signs: Temp: 98.6 F (37 C) (01/12 1900) Temp Source: Oral (01/12 1900) BP: 146/86 (01/12 1900) Pulse Rate: 72 (01/12 1900)  Labs: Recent Labs    07/04/19 0707 07/04/19 1202 07/04/19 2235  HGB 12.3  --   --   HCT 40.0  --   --   PLT 403*  --   --   LABPROT 20.7*  --   --   INR 1.8*  --   --   HEPARINUNFRC  --   --  0.74*  CREATININE 0.81  --   --   TROPONINIHS 12 11  --     Estimated Creatinine Clearance: 66.4 mL/min (by C-G formula based on SCr of 0.81 mg/dL).  Assessment:  Anticoag: hx of PE (2017) and MVR. Hep level 0.74 slightly above goal. - PTA warfarin 5 mg MWF; 2.5 mg all other days. Admit INR 1.8 (Goal 2.5 - 3.5)  Goal of Therapy:  Heparin level 0.3-0.7 units/ml Monitor platelets by anticoagulation protocol: Yes   Plan:  Decrease IV heparin to 1250 units/ and recheck in AM. Daily HL and CBC   Kim Anthony, PharmD, BCPS Clinical Staff Pharmacist [email or web address] Alford Anthony, Kim Anthony 07/04/2019,11:16 PM

## 2019-07-04 NOTE — ED Notes (Signed)
Attempted to give report to [id number].

## 2019-07-04 NOTE — ED Notes (Signed)
Patient transported to CT 

## 2019-07-04 NOTE — ED Notes (Signed)
Pt declined to have this RN call anyone

## 2019-07-04 NOTE — H&P (Addendum)
Date: 07/04/2019               Patient Name:  Kim Anthony [name]: [id number]  DOB: 1951-01-12 Age / Sex: 69 y.o., female   PCP: Marianna Payment, MD              Medical Service: Internal Medicine Teaching Service              Attending Physician: Dr. Joni Reining, MD    First Contact: Bebe Liter, MS3    Second Contact: Dr. Al Decant, MD    Third Contact Dr. Nathanial Rancher, MD         After Hours (After 5p/  First Contact Pager: [phone]  weekends / holidays): Second Contact Pager: [phone]   Chief Complaint: Chest pain  History of Present Illness: Kim Anthony is a 69 y.o. female with past medical history of pulmonary embolism, congestive heart failure, coronary artery disease, mitral valve replacement, hypertension, hyperlipidemia, and COPD who presents with chest pain x2 days. Patient complains of sudden onset chest pain and shortness of breath which started yesterday upon awakening. She describes her pain as a tightness and states it worsens with inspiration. It feels similar to a prior PE she experienced 3 years ago. She has been using her prescribed albuterol and fluticasone without relief. Patient states this pain kept her up throughout the night and reached maximum intensity this morning upon awakening 10 hours ago. She subsequently presented to the ED for evaluation. Patient endorses associated productive cough with scant yellow sputum but denies hemoptysis, lower leg swelling, lower leg pain, orthopnea, fever, chills, nausea, vomiting, diaphoresis, or weight loss. She further denies any recent immobilization, recent surgery, or oral contraceptive use. She has been taking her prescribed warfarin as directed and denies any recent changes to her diet. She endorses smoking half a pack of cigarettes per day x30 years. Of note, patient was seen in the ED recently on 12/21 for persistent cough and was diagnosed with pneumonia. She was discharged home on doxycycline and prednisone taper which has  given her progressive relief.    In the ED today, patient's INR found to be slightly subtherapeutic at 1.8. Well's score found to be 4.5 giving her a moderate risk of pulmonary embolism. She subsequently received a CTA chest which revealed a focal pulmonary embolus in the apical segment left upper lobe pulmonary artery.  Meds:  Current Meds  Medication Sig   albuterol (PROVENTIL) (2.5 MG/3ML) 0.083% nebulizer solution Take 3 mLs (2.5 mg total) by nebulization every 6 (six) hours as needed for wheezing or shortness of breath.   albuterol (VENTOLIN HFA) 108 (90 Base) MCG/ACT inhaler Inhale 1-2 puffs into the lungs every 6 (six) hours as needed for wheezing or shortness of breath.   ANORO ELLIPTA 62.5-25 MCG/INH AEPB Inhale 1 puff by mouth once daily (Patient taking differently: Inhale 1 puff into the lungs daily. )   Calcium-Phosphorus-Vitamin D (CITRACAL +D3 PO) Take 1 tablet by mouth 2 (two) times a day.   fluticasone (FLONASE) 50 MCG/ACT nasal spray Place 2 sprays into both nostrils daily. (Patient taking differently: Place 1 spray into both nostrils daily as needed for allergies. )   furosemide (LASIX) 40 MG tablet Take 1 tablet (40 mg total) by mouth daily.   gabapentin (NEURONTIN) 600 MG tablet Take 1 tablet (600 mg total) by mouth 3 (three) times daily.   lisinopril (ZESTRIL) 10 MG tablet Take 1 tablet (10 mg total) by mouth daily.   metoprolol succinate (TOPROL-XL)  25 MG 24 hr tablet TAKE 1/2 (ONE-HALF) TABLET BY MOUTH ONCE DAILY (Patient taking differently: Take 12.5 mg by mouth daily. TAKE 1/2 (ONE-HALF) TABLET BY MOUTH ONCE DAILY)   polyethylene glycol (MIRALAX / GLYCOLAX) 17 g packet Take 17 g by mouth daily as needed for mild constipation.   potassium chloride SA (K-DUR) 20 MEQ tablet Take 2 tablets (40 mEq total) by mouth daily.   simvastatin (ZOCOR) 20 MG tablet Take 1 tablet by mouth once daily   warfarin (COUMADIN) 5 MG tablet Take 1 and 1/2 tablets on Sundays, Tuesdays, Thursdays  and Saturdays. Mondays, Wednesdays and Fridays, take only 1 tablet. (Patient taking differently: Take 2.5-5 mg by mouth See admin instructions. Directed by clinic weekly.)     Allergies: Allergies as of 07/04/2019 - Review Complete 07/04/2019  Allergen Reaction Noted   Acetaminophen-codeine Itching 07/10/2015   Propoxyphene Itching 01/18/2019   Tape Rash 03/09/2019   Tramadol Itching and Nausea And Vomiting 07/10/2015   Past Medical History:  Diagnosis Date   Arthritis    Asthma    Breast cancer ([place]) 2001   Left Breast Cancer   CHF (congestive heart failure) (HCC)    COPD (chronic obstructive pulmonary disease) (HCC)    Coronary artery disease    History of mitral valve replacement with mechanical valve    HLD (hyperlipidemia)    Hypertension    Pulmonary embolism ([place])     Family History:   Family History  Problem Relation Age of Onset   Heart attack Other    Breast cancer Maternal Aunt 50    Social History:   Social History   Socioeconomic History   Marital status: Single    Spouse name: Not on file   Number of children: Not on file   Years of education: Not on file   Highest education level: Not on file  Occupational History   Not on file  Tobacco Use   Smoking status: Current Every Day Smoker    Packs/day: 0.25    Years: 30.00    Pack years: 7.50    Types: Cigarettes   Smokeless tobacco: Never Used   Tobacco comment: Sometimes less.  Substance and Sexual Activity   Alcohol use: Yes    Comment: beer- ocasional   Drug use: No   Sexual activity: Not Currently    Birth control/protection: None  Other Topics Concern   Not on file  Social History Narrative   ** Merged History Encounter **       Social Determinants of Health   Financial Resource Strain:    Difficulty of Paying Living Expenses: Not on file  Food Insecurity:    Worried About Charity fundraiser in the Last Year: Not on file   [employer] of Food in the Last Year: Not on file   Transportation Needs:    Lack of Transportation (Medical): Not on file   Lack of Transportation (Non-Medical): Not on file  Physical Activity:    Days of Exercise per Week: Not on file   Minutes of Exercise per Session: Not on file  Stress:    Feeling of Stress : Not on file  Social Connections:    Frequency of Communication with Friends and Family: Not on file   Frequency of Social Gatherings with Friends and Family: Not on file   Attends Religious Services: Not on file   Active Member of Clubs or Organizations: Not on file   Attends Archivist Meetings: Not on file  Marital Status: Not on file    Review of Systems: A complete ROS was negative except as per HPI.  Physical Exam: Blood pressure (!) 173/100, pulse 77, temperature 98.2 F (36.8 C), temperature source Oral, resp. rate 19, height 5\' 5"  (1.651 m), weight 75 kg, SpO2 94 %.  Physical Exam  Constitutional: She is oriented to person, place, and time and well-developed, well-nourished, and in no distress. No distress.  HENT:  Head: Normocephalic and atraumatic.  Eyes: Pupils are equal, round, and reactive to light. Conjunctivae and EOM are normal.  Cardiovascular: Normal rate and regular rhythm.  Murmur (diastolic murmur heard best at left lower sternal border) heard. Pulmonary/Chest: Effort normal. No respiratory distress. She has no wheezes.  Abdominal: Soft. Bowel sounds are normal. There is no abdominal tenderness.  Musculoskeletal:        General: Normal range of motion.     Cervical back: Normal range of motion and neck supple.  Neurological: She is alert and oriented to person, place, and time.  Skin: Skin is warm and dry. No rash noted. She is not diaphoretic.  Psychiatric: Affect normal.  Nursing note and vitals reviewed.  EKG: personally reviewed my interpretation is NSR at 73 bpm with normal axis. Nonspecific T wave inversions in the lateral leads.    CXR: personally reviewed my interpretation  is enlarged cardiac silhouette seen with hyperinflated lungs consistent with known CHF and COPD respectively. Chronic bronchitic markings and left sided radiation fibrosis. No acute changes.   Assessment & Plan by Problem: Principal Problem:   Pulmonary embolism (HCC) Active Problems:   Chronic combined systolic and diastolic congestive heart failure (HCC)   Essential hypertension   Tobacco use disorder   COPD (chronic obstructive pulmonary disease) (HCC)   S/P MVR (mitral valve replacement)   Chronic anticoagulation  Chest pain and shortness of breath 2/2 PE: Patient complains of sudden in onset chest tightness and shortness of breath which has progressively worsened over the past two days. She states the pain feels similar to past PE. Well's score of 4.5 on presentation giving her moderate risk of PE. CTA chest obtained revealed a focal pulmonary embolus in the apical segment left upper lobe pulmonary artery. She was subsequently given heparin bolus and started on drip. Patient currently hemodynamically stable. PESI score of 119 puts her at class IV high risk.   - heparin 1300 units/hr per pharmacy - warfarin 7.5 mg PO qd - heparin level  - INR checks qd - continuous pulse oximetry - tele - CBC qAM - BMP qAM  Prominence of the ascending thoracic aorta measuring 4.1 x 4.0 cm in diameter: - Recommend annual imaging followup by CTA or MRA  COPD:  - Continue home albuterol 2.5 mg q6h prn - Continue home Ellipta 62.5-25 mcg 1 puff inhalation qd   Chronic combined CHF:  - Continue home furosemide 40 mg qd - Continue home metoprolol succinate 12.5 mg qd - Continue home lisinopril 10 mg qd  CAD: - Continue simvastatin 20 mg qd - Continue metoprolol succinate 12.5 mg qd  HTN: Patient hypertensive to 157/100 mmHg on admission.  - Continue home metoprolol succinate 12.5 mg qd - Continue home lisinopril 10 mg qd - Hydralazine 10 mg IV q4h prn of SBP>180 or DBP>100   Dispo: Admit  patient to Observation with expected length of stay less than 2 midnights.  Signed: Bebe Liter, MS3 07/04/2019, 4:03 PM  Attestation for Student Documentation:  I personally was present and performed or re-performed  the history, physical exam and medical decision-making activities of this service and have verified that the service and findings are accurately documented in the student's note.  Al Decant, MD 07/04/2019, 5:11 PM

## 2019-07-04 NOTE — Progress Notes (Addendum)
ANTICOAGULATION CONSULT NOTE - Initial Consult  Pharmacy Consult for Heparin and Warfarin  Indication: pulmonary embolus  Allergies  Allergen Reactions  . Acetaminophen-Codeine Itching  . Propoxyphene Itching    Darvocet  . Tramadol Itching and Nausea And Vomiting  . Tape Rash    Plastic    Patient Measurements:   Heparin Dosing Weight: 72.4  Vital Signs: Temp: 98.2 F (36.8 C) (01/12 0654) Temp Source: Oral (01/12 0654) BP: 159/107 (01/12 1445) Pulse Rate: 74 (01/12 1445)  Labs: Recent Labs    07/04/19 0707 07/04/19 1202  HGB 12.3  --   HCT 40.0  --   PLT 403*  --   LABPROT 20.7*  --   INR 1.8*  --   CREATININE 0.81  --   TROPONINIHS 12 11    Estimated Creatinine Clearance: 66.4 mL/min (by C-G formula based on SCr of 0.81 mg/dL).   Medical History: Past Medical History:  Diagnosis Date  . Arthritis   . Asthma   . Breast cancer ([place]) 2001   Left Breast Cancer  . CHF (congestive heart failure) ([place])   . COPD (chronic obstructive pulmonary disease) ([place])   . Coronary artery disease   . History of mitral valve replacement with mechanical valve   . HLD (hyperlipidemia)   . Hypertension   . Pulmonary embolism (HCC)     Assessment: 69 y.o. Female with history of mitral valve replacement and history of pulmonary embolism (2017).Patient presents with chest pain, cough, and shortness of breath starting approximately 2 days ago. Prior to admission, patient was on Warfarin 5 mg on MWF and 2.5 mg all other days (Prior Warfarin dose before antibiotics was the following Warfarin  7 mg STThS and 5 mg MWF). Patient recently finished antibiotics and prednisone on 07/02/2019.   INR on admission was 1.8 (Goal 2.5 - 3.5).  Imaging was significant for a new PE.  Goal of Therapy:  Heparin level 0.3-0.7 units/ml Monitor platelets by anticoagulation protocol: Yes   Plan:  Heparin 5,000 units bolus x1; then start heparin gtt at 1,300 units/hr Warfarin 7.5 mg x1 dose at  1800 tonight  Monitor heparin level in 6 hours Monitor heparin level, CBC, and s/sx bleeding daily  Acey Lav, PharmD  PGY1 [place] Resident [phone] 07/04/2019,2:49 PM

## 2019-07-04 NOTE — ED Notes (Signed)
Two unsuccessful attempt for an IV insertion, CT aware that IV team will be consulted.

## 2019-07-04 NOTE — ED Notes (Signed)
IV team at bedside 

## 2019-07-05 DIAGNOSIS — E785 Hyperlipidemia, unspecified: Secondary | ICD-10-CM | POA: Diagnosis present

## 2019-07-05 DIAGNOSIS — I712 Thoracic aortic aneurysm, without rupture: Secondary | ICD-10-CM

## 2019-07-05 DIAGNOSIS — I7121 Aneurysm of the ascending aorta, without rupture: Secondary | ICD-10-CM

## 2019-07-05 DIAGNOSIS — I77810 Thoracic aortic ectasia: Secondary | ICD-10-CM | POA: Diagnosis not present

## 2019-07-05 DIAGNOSIS — Z853 Personal history of malignant neoplasm of breast: Secondary | ICD-10-CM | POA: Diagnosis not present

## 2019-07-05 DIAGNOSIS — I5042 Chronic combined systolic (congestive) and diastolic (congestive) heart failure: Secondary | ICD-10-CM

## 2019-07-05 DIAGNOSIS — R011 Cardiac murmur, unspecified: Secondary | ICD-10-CM

## 2019-07-05 DIAGNOSIS — Z7901 Long term (current) use of anticoagulants: Secondary | ICD-10-CM

## 2019-07-05 DIAGNOSIS — I110 Hypertensive heart disease with heart failure: Secondary | ICD-10-CM | POA: Diagnosis present

## 2019-07-05 DIAGNOSIS — I2510 Atherosclerotic heart disease of native coronary artery without angina pectoris: Secondary | ICD-10-CM | POA: Diagnosis present

## 2019-07-05 DIAGNOSIS — Z9012 Acquired absence of left breast and nipple: Secondary | ICD-10-CM | POA: Diagnosis not present

## 2019-07-05 DIAGNOSIS — Z20822 Contact with and (suspected) exposure to covid-19: Secondary | ICD-10-CM | POA: Diagnosis present

## 2019-07-05 DIAGNOSIS — Z7951 Long term (current) use of inhaled steroids: Secondary | ICD-10-CM

## 2019-07-05 DIAGNOSIS — Z803 Family history of malignant neoplasm of breast: Secondary | ICD-10-CM | POA: Diagnosis not present

## 2019-07-05 DIAGNOSIS — Z720 Tobacco use: Secondary | ICD-10-CM | POA: Diagnosis not present

## 2019-07-05 DIAGNOSIS — M1990 Unspecified osteoarthritis, unspecified site: Secondary | ICD-10-CM | POA: Diagnosis present

## 2019-07-05 DIAGNOSIS — J449 Chronic obstructive pulmonary disease, unspecified: Secondary | ICD-10-CM

## 2019-07-05 DIAGNOSIS — Z952 Presence of prosthetic heart valve: Secondary | ICD-10-CM | POA: Diagnosis not present

## 2019-07-05 DIAGNOSIS — Z91048 Other nonmedicinal substance allergy status: Secondary | ICD-10-CM | POA: Diagnosis not present

## 2019-07-05 DIAGNOSIS — J440 Chronic obstructive pulmonary disease with acute lower respiratory infection: Secondary | ICD-10-CM | POA: Diagnosis present

## 2019-07-05 DIAGNOSIS — I2699 Other pulmonary embolism without acute cor pulmonale: Principal | ICD-10-CM

## 2019-07-05 DIAGNOSIS — Z923 Personal history of irradiation: Secondary | ICD-10-CM | POA: Diagnosis not present

## 2019-07-05 DIAGNOSIS — Z8249 Family history of ischemic heart disease and other diseases of the circulatory system: Secondary | ICD-10-CM | POA: Diagnosis not present

## 2019-07-05 DIAGNOSIS — Z79899 Other long term (current) drug therapy: Secondary | ICD-10-CM

## 2019-07-05 DIAGNOSIS — Z885 Allergy status to narcotic agent status: Secondary | ICD-10-CM | POA: Diagnosis not present

## 2019-07-05 DIAGNOSIS — F17210 Nicotine dependence, cigarettes, uncomplicated: Secondary | ICD-10-CM | POA: Diagnosis present

## 2019-07-05 DIAGNOSIS — Z888 Allergy status to other drugs, medicaments and biological substances status: Secondary | ICD-10-CM | POA: Diagnosis not present

## 2019-07-05 DIAGNOSIS — Z7952 Long term (current) use of systemic steroids: Secondary | ICD-10-CM | POA: Diagnosis not present

## 2019-07-05 DIAGNOSIS — Z86711 Personal history of pulmonary embolism: Secondary | ICD-10-CM | POA: Diagnosis not present

## 2019-07-05 DIAGNOSIS — Z9221 Personal history of antineoplastic chemotherapy: Secondary | ICD-10-CM | POA: Diagnosis not present

## 2019-07-05 LAB — CBC
HCT: 38.7 % (ref 36.0–46.0)
Hemoglobin: 12.3 g/dL (ref 12.0–15.0)
MCH: 32.3 pg (ref 26.0–34.0)
MCHC: 31.8 g/dL (ref 30.0–36.0)
MCV: 101.6 fL — ABNORMAL HIGH (ref 80.0–100.0)
Platelets: 410 10*3/uL — ABNORMAL HIGH (ref 150–400)
RBC: 3.81 MIL/uL — ABNORMAL LOW (ref 3.87–5.11)
RDW: 12.8 % (ref 11.5–15.5)
WBC: 16.1 10*3/uL — ABNORMAL HIGH (ref 4.0–10.5)
nRBC: 0 % (ref 0.0–0.2)

## 2019-07-05 LAB — BASIC METABOLIC PANEL
Anion gap: 12 (ref 5–15)
BUN: 22 mg/dL (ref 8–23)
CO2: 23 mmol/L (ref 22–32)
Calcium: 8.7 mg/dL — ABNORMAL LOW (ref 8.9–10.3)
Chloride: 101 mmol/L (ref 98–111)
Creatinine, Ser: 0.94 mg/dL (ref 0.44–1.00)
GFR calc Af Amer: >60 mL/min (ref >60)
GFR calc non Af Amer: >60 mL/min (ref >60)
Glucose, Bld: 112 mg/dL — ABNORMAL HIGH (ref 70–99)
Potassium: 4.3 mmol/L (ref 3.5–5.1)
Sodium: 136 mmol/L (ref 135–145)

## 2019-07-05 LAB — PROTIME-INR
INR: 1.7 — ABNORMAL HIGH (ref 0.8–1.2)
Prothrombin Time: 19.7 seconds — ABNORMAL HIGH (ref 11.4–15.2)

## 2019-07-05 LAB — PHOSPHORUS: Phosphorus: 3.1 mg/dL (ref 2.5–4.6)

## 2019-07-05 LAB — D-DIMER, QUANTITATIVE: D-Dimer, Quant: 0.36 ug/mL-FEU (ref 0.00–0.50)

## 2019-07-05 LAB — HEPARIN LEVEL (UNFRACTIONATED): Heparin Unfractionated: 0.81 IU/mL — ABNORMAL HIGH (ref 0.30–0.70)

## 2019-07-05 LAB — MAGNESIUM: Magnesium: 2.2 mg/dL (ref 1.7–2.4)

## 2019-07-05 MED ORDER — ENOXAPARIN SODIUM 80 MG/0.8ML ~~LOC~~ SOLN
1.0000 mg/kg | Freq: Two times a day (BID) | SUBCUTANEOUS | Status: DC
  Administered 2019-07-05 – 2019-07-06 (×2): 75 mg via SUBCUTANEOUS
  Filled 2019-07-05 (×2): qty 0.8

## 2019-07-05 MED ORDER — WARFARIN SODIUM 7.5 MG PO TABS
7.5000 mg | ORAL_TABLET | Freq: Once | ORAL | Status: AC
  Administered 2019-07-05: 7.5 mg via ORAL
  Filled 2019-07-05: qty 1

## 2019-07-05 MED ORDER — OXYCODONE HCL 5 MG PO TABS
5.0000 mg | ORAL_TABLET | Freq: Four times a day (QID) | ORAL | Status: AC | PRN
  Administered 2019-07-05: 5 mg via ORAL
  Filled 2019-07-05: qty 1

## 2019-07-05 MED ORDER — OXYCODONE HCL 5 MG PO TABS
5.0000 mg | ORAL_TABLET | Freq: Once | ORAL | Status: AC | PRN
  Administered 2019-07-05: 5 mg via ORAL
  Filled 2019-07-05: qty 1

## 2019-07-05 MED ORDER — OXYCODONE HCL 5 MG PO TABS
5.0000 mg | ORAL_TABLET | Freq: Four times a day (QID) | ORAL | Status: AC | PRN
  Administered 2019-07-05 – 2019-07-06 (×3): 5 mg via ORAL
  Filled 2019-07-05 (×3): qty 1

## 2019-07-05 MED ORDER — ACETAMINOPHEN 325 MG PO TABS
650.0000 mg | ORAL_TABLET | Freq: Four times a day (QID) | ORAL | Status: DC | PRN
  Administered 2019-07-05 (×3): 650 mg via ORAL
  Filled 2019-07-05 (×3): qty 2

## 2019-07-05 NOTE — Progress Notes (Signed)
ANTICOAGULATION CONSULT NOTE - Follow Up Consult  Pharmacy Consult for Heparin Indication: hx of PE (2017) and MVR  Allergies  Allergen Reactions  . Acetaminophen-Codeine Itching  . Propoxyphene Itching    Darvocet  . Tape Rash    Plastic  . Tramadol Itching and Nausea And Vomiting    Patient Measurements: Height: 5\' 5"  (165.1 cm) Weight: 165 lb 5.5 oz (75 kg) IBW/kg (Calculated) : 57 Heparin Dosing Weight: 72.4 kg  Vital Signs: Temp: 97.9 F (36.6 C) (01/13 0606) Temp Source: Oral (01/13 0606) BP: 123/88 (01/13 0606) Pulse Rate: 69 (01/13 0606)  Labs: Recent Labs    07/04/19 0707 07/04/19 1202 07/04/19 2235 07/05/19 0539  HGB 12.3  --   --  12.3  HCT 40.0  --   --  38.7  PLT 403*  --   --  410*  LABPROT 20.7*  --   --   --   INR 1.8*  --   --   --   HEPARINUNFRC  --   --  0.74* 0.81*  CREATININE 0.81  --   --  0.94  TROPONINIHS 12 11  --   --     Estimated Creatinine Clearance: 57.2 mL/min (by C-G formula based on SCr of 0.94 mg/dL).  Assessment:  Anticoag: hx of PE (2017) and MVR. Hep level 0.74>>0.84 up more today. Hgb 12.3 stable. INR pending - PTA warfarin 5 mg MWF; 2.5 mg all other days. Admit INR 1.8 (Goal 2.5 - 3.5)  Goal of Therapy:  Heparin level 0.3-0.7 units/ml Monitor platelets by anticoagulation protocol: Yes   Plan:  Decrease IV heparin to 1100 units/hr Recheck HL in 6 hrs. Daily HL and CBC   Nimo Verastegui S. Alford Highland, PharmD, BCPS Clinical Staff Pharmacist [email or web address] Alford Highland, Eaven Schwager Stilling[place] 07/05/2019,7:18 AM

## 2019-07-05 NOTE — Discharge Summary (Signed)
Name: Kim Anthony [name]: [id number] DOB: 1951-06-21 69 y.o. PCP: Kim Anthony  Date of Admission: 07/04/2019  6:44 AM Date of Discharge:  07/05/2019 Attending Physician: Kim Anthony  Discharge Diagnosis:  1. Pulmonary embolism  2. Ascending aortic dilation  Discharge Medications: Allergies as of 07/06/2019      Reactions   Acetaminophen-codeine Itching   Propoxyphene Itching   Darvocet   Tape Rash   Plastic   Tramadol Itching, Nausea And Vomiting      Medication List    STOP taking these medications   diazepam 2 MG tablet Commonly known as: Valium   diclofenac 50 MG EC tablet Commonly known as: VOLTAREN   doxycycline 100 MG tablet Commonly known as: VIBRA-TABS   etodolac 300 MG capsule Commonly known as: LODINE   HYDROcodone-acetaminophen 5-325 MG tablet Commonly known as: NORCO/VICODIN   predniSONE 10 MG (21) Tbpk tablet Commonly known as: STERAPRED UNI-PAK 21 TAB   senna 8.6 MG Tabs tablet Commonly known as: SENOKOT   triamcinolone 0.025 % ointment Commonly known as: KENALOG     TAKE these medications   albuterol (2.5 MG/3ML) 0.083% nebulizer solution Commonly known as: PROVENTIL Take 3 mLs (2.5 mg total) by nebulization every 6 (six) hours as needed for wheezing or shortness of breath.   albuterol 108 (90 Base) MCG/ACT inhaler Commonly known as: VENTOLIN HFA Inhale 1-2 puffs into the lungs every 6 (six) hours as needed for wheezing or shortness of breath.   Anoro Ellipta 62.5-25 MCG/INH Aepb Generic drug: umeclidinium-vilanterol Inhale 1 puff by mouth once daily What changed: See the new instructions.   CITRACAL +D3 PO Take 1 tablet by mouth 2 (two) times a day.   enoxaparin 80 MG/0.8ML injection Commonly known as: LOVENOX Inject 0.75 mLs (75 mg total) into the skin every 12 (twelve) hours for 7 doses.   fluticasone 50 MCG/ACT nasal spray Commonly known as: Flonase Place 2 sprays into both nostrils daily. What changed:   how  much to take  when to take this  reasons to take this   furosemide 40 MG tablet Commonly known as: Lasix Take 1 tablet (40 mg total) by mouth daily.   gabapentin 600 MG tablet Commonly known as: NEURONTIN Take 1 tablet (600 mg total) by mouth 3 (three) times daily.   lisinopril 10 MG tablet Commonly known as: ZESTRIL Take 1 tablet (10 mg total) by mouth daily.   metoprolol succinate 25 MG 24 hr tablet Commonly known as: TOPROL-XL TAKE 1/2 (ONE-HALF) TABLET BY MOUTH ONCE DAILY What changed:   how much to take  how to take this  when to take this   polyethylene glycol 17 g packet Commonly known as: MIRALAX / GLYCOLAX Take 17 g by mouth daily as needed for mild constipation.   potassium chloride SA 20 MEQ tablet Commonly known as: KLOR-CON Take 2 tablets (40 mEq total) by mouth daily.   simvastatin 20 MG tablet Commonly known as: ZOCOR Take 1 tablet by mouth once daily   warfarin 5 MG tablet Commonly known as: Coumadin Take as directed. If you are unsure how to take this medication, talk to your nurse or doctor. Original instructions: Take 1 and 1/2 tablets on Sundays, Tuesdays, Thursdays and Saturdays. Mondays, Wednesdays and Fridays, take only 1 tablet. What changed:   how much to take  how to take this  when to take this  additional instructions      Disposition and follow-up:   [name] was discharged  from [facility] in Good condition.  At the hospital follow up visit please address:   1.  Recheck INR  2.  Address aortic dilation  Follow-up Appointments: Follow-up Information    Kim Anthony. Go in 1 week(s).   Specialty: Internal Medicine Why: Follow up January 19th at 9:45 AM for a hospital follow up and at 10:30 AM for an appointment with Kim Anthony.  Contact information: [address] [address] [place] Hospital Course by problem list:   Chest pain and  shortness of breath: Patient complains of sudden in onset chest tightness and shortness of breath which has progressively worsened over the past two days. She states the pain feels similar to past PE. Well's score of 4.5 on presentation giving her moderate risk of PE. CTA chest obtained revealed afocal pulmonary embolus in the apical segment left upper lobe pulmonary artery. She was subsequently given heparin bolus and started on drip. Patient currently hemodynamically stable. PESI score of 119 puts her at class IV high risk. Bridged warfarin with lovenox for 5 days. Close follow up with Kim Anthony in Coumadin clinic.   Prominence of the ascending thoracic aorta measuring 4.1 x 4.0 cm in diameter: -Recommend annual imaging followup by CTA or MRA  Discharge Vitals:   BP 130/83 (BP Location: Right Arm)   Pulse 66   Temp 97.9 F (36.6 C) (Oral)   Resp 18   Ht 5\' 5"  (1.651 m)   Wt 75 kg   SpO2 96%   BMI 27.51 kg/m   Pertinent Labs, Studies, and Procedures:  CBC Latest Ref Rng & Units 07/05/2019 07/04/2019 03/09/2019  WBC 4.0 - 10.5 K/uL 16.1(H) 12.3(H) -  Hemoglobin 12.0 - 15.0 g/dL 12.3 12.3 -  Hematocrit 36.0 - 46.0 % 38.7 40.0 37.2  Platelets 150 - 400 K/uL 410(H) 403(H) -   CMP Latest Ref Rng & Units 07/05/2019 07/04/2019 01/31/2019  Glucose 70 - 99 mg/dL 112(H) 94 109(H)  BUN 8 - 23 mg/dL 22 18 11   Creatinine 0.44 - 1.00 mg/dL 0.94 0.81 0.85  Sodium 135 - 145 mmol/L 136 141 137  Potassium 3.5 - 5.1 mmol/L 4.3 4.8 3.8  Chloride 98 - 111 mmol/L 101 103 105  CO2 22 - 32 mmol/L 23 30 20(L)  Calcium 8.9 - 10.3 mg/dL 8.7(L) 8.6(L) 8.4(L)  Total Protein 6.5 - 8.1 g/dL - - 6.6  Total Bilirubin 0.3 - 1.2 mg/dL - - 0.2(L)  Alkaline Phos 38 - 126 U/L - - 71  AST 15 - 41 U/L - - 20  ALT 0 - 44 U/L - - 11   DG Chest 2 View  Result Date: 07/04/2019 CLINICAL DATA:  Shortness of breath.  Chest pain. EXAM: CHEST - 2 VIEW COMPARISON:  06/22/2019 FINDINGS: Cardiomegaly. Mitral valve replacement.  Hazy density over the left mid chest from radiation fibrosis by most recent chest CT. There is generalized interstitial coarsening correlating with emphysema. There is no edema, consolidation, effusion, or pneumothorax. IMPRESSION: 1. No acute finding. 2. Chronic bronchitic markings, left-sided radiation fibrosis, and cardiomegaly. Electronically Signed   By: Monte Fantasia M.D.   On: 07/04/2019 07:37   DG Chest 2 View  Result Date: 06/22/2019 CLINICAL DATA:  Cough. Additional history provided: History of COPD, CHF, breast cancer. EXAM: CHEST - 2 VIEW COMPARISON:  Chest radiograph 01/28/2019 FINDINGS: Prior median sternotomy. Redemonstrated mitral valve prosthesis. Surgical clips  project in the region of the left axilla. Overlying cardiac monitoring leads. Unchanged cardiomegaly. There is bilateral interstitial prominence. Additionally, there are bilateral ill-defined airspace opacities greatest within the left lung apex, left mid lung and right lung base. No evidence of pleural effusion or pneumothorax. No acute bony abnormality. IMPRESSION: Cardiomegaly Bilateral interstitial prominence. Patchy bilateral airspace opacities greatest within the left lung apex, left mid lung and right lung base. Findings may reflect pulmonary edema or multifocal pneumonia. Radiographic follow-up to resolution recommended. Electronically Signed   By: Kellie Simmering Anthony   On: 06/22/2019 11:09   DG Lumbar Spine Complete  Result Date: 06/22/2019 CLINICAL DATA:  Low back pain radiating to the right hip, groin and leg since 06/08/2019. No known injury. EXAM: LUMBAR SPINE - COMPLETE 4+ VIEW COMPARISON:  Plain films lumbar spine 05/15/2019. FINDINGS: No fracture or malalignment. Mild anterior endplate spurring at multiple levels is unchanged. There is facet degenerative disease at L4-5 and L5-S1. Paraspinous structures demonstrate atherosclerosis. IMPRESSION: No acute abnormality. Mild degenerative disease. Electronically Signed    By: Inge Rise M.D.   On: 06/22/2019 11:10   CT Angio Chest PE W and/or Wo Contrast  Result Date: 07/04/2019 CLINICAL DATA:  Shortness of breath and chest pain. Cough. History of breast carcinoma EXAM: CT ANGIOGRAPHY CHEST WITH CONTRAST TECHNIQUE: Multidetector CT imaging of the chest was performed using the standard protocol during bolus administration of intravenous contrast. Multiplanar CT image reconstructions and MIPs were obtained to evaluate the vascular anatomy. CONTRAST:  28mL OMNIPAQUE IOHEXOL 350 MG/ML SOLN COMPARISON:  Chest CT January 26, 2019; chest radiograph July 04, 2019. Prior chest CT June 08, 2018 FINDINGS: Cardiovascular: There is a focal pulmonary embolus in the apical segment of the left upper lobe pulmonary artery. No other pulmonary emboli are identified. There is generalized cardiomegaly. The right ventricle is somewhat prominent, likely due to chronic mitral valve disease. Note that there is a mitral valve replacement present. It is doubtful that right ventricle prominence is due to right heart strain secondary to this single small pulmonary embolus. Ascending thoracic aortic diameter measures 4.1 x 4.0 cm. No dissection is evident in the aorta. Note that the contrast bolus in the aorta is not sufficient to allow for exclusion of dissection as a potential differential consideration. There are occasional foci of calcification in the visualized great vessels. Note that the right innominate and left common carotid arteries arise as a common trunk. There are foci of aortic atherosclerosis there are foci of coronary artery calcification. There is no pericardial effusion. There is localized pericardial thickening and calcification at the left apex which may be secondary to radiation therapy change in this area. This area appears stable. The main pulmonary outflow tract measures 3.2 cm in diameter, prominent. Mediastinum/Nodes: No thyroid lesions are evident. There are  subcentimeter mediastinal lymph nodes. No adenopathy is evident by size criteria on this study. No appreciable esophageal lesion evident. Lungs/Pleura: There is underlying centrilobular emphysematous change. There is pleural thickening along the anterior left hemithorax with areas of calcification which in part may be due to post radiation therapy change in in part may be due to previous asbestos exposure. There is a stable masslike area with fibrosis in the lateral left apex which is stable and likely in large part due to radiation therapy change. Note that this masslike area of fibrotic type change in the left apex measures 2.8 x 2.5 cm. There is no edema or consolidation. No pleural effusion evident. Upper Abdomen: There is  reflux of contrast into the inferior vena cava and hepatic veins. There is upper abdominal aortic atherosclerosis. Visualized upper abdominal structures otherwise appear unremarkable. Musculoskeletal: Patient is status post median sternotomy. No blastic or lytic bone lesions are evident. No chest wall lesions are evident. Review of the MIP images confirms the above findings. IMPRESSION: 1. There is a focal pulmonary embolus in the apical segment left upper lobe pulmonary artery. No other pulmonary emboli evident. Heart is enlarged. Prominence of the right ventricle is felt to most likely represent sequela of prior mitral valve disease. Note that patient is status post mitral valve replacement. There is not felt to be right heart strain secondary to pulmonary embolus. 2. Prominence of the ascending thoracic aorta measuring 4.1 x 4.0 cm in diameter. Recommend annual imaging followup by CTA or MRA. This recommendation follows 2010 ACCF/AHA/AATS/ACR/ASA/SCA/SCAI/SIR/STS/SVM Guidelines for the Diagnosis and Management of Patients with Thoracic Aortic Disease. Circulation. 2010; [id number]. Aortic aneurysm NOS (ICD10-I71.9). No dissection evident with proviso that the contrast bolus is not  sufficient to confidently exclude dissection is a potential consideration. There are foci of aortic atherosclerosis. There are foci of coronary artery calcification. 3. Apparent radiation therapy change on the left with masslike area of fibrosis in the left apex laterally, stable. There is lateral pleural thickening on the left with calcification. This calcification may be secondary to radiation therapy change. Previous asbestos exposure could present similarly. There is also localized pleural thickening and calcification at the left apex, likely secondary to radiation therapy change. 4.  Underlying emphysematous change. 5.  No appreciable adenopathy. 6. Reflux of contrast into the inferior vena cava and hepatic veins, a finding most likely indicative of a degree of increased right heart pressure. Aortic Atherosclerosis (ICD10-I70.0) and Emphysema (ICD10-J43.9). Critical Value/emergent results were called by telephone at the time of interpretation on 07/04/2019 at 2:24 pm to [place] , who verbally acknowledged these results. Electronically Signed   By: Lowella Grip III M.D.   On: 07/04/2019 14:24   DG HIP UNILAT WITH PELVIS 2-3 VIEWS RIGHT  Result Date: 06/22/2019 CLINICAL DATA:  Low back pain radiating into the right groin, hip and leg since 06/08/2019. EXAM: DG HIP (WITH OR WITHOUT PELVIS) 2-3V RIGHT COMPARISON:  Plain films the right hip 05/15/2019. FINDINGS: There is no acute bony or joint abnormality. Mild bilateral hip joint space narrowing, osteophytosis and subchondral sclerosis are symmetric from right to left and unchanged. No lytic or sclerotic lesion. Soft tissues are unremarkable. IMPRESSION: No change in mild bilateral hip osteoarthritis. No acute abnormality. Electronically Signed   By: Inge Rise M.D.   On: 06/22/2019 11:12    Discharge Instructions: Discharge Instructions    Activity as tolerated - No restrictions   Complete by: As directed    Diet - low sodium  heart healthy   Complete by: As directed       Signed: Al Decant, Anthony 07/06/2019, 10:18 AM   Pager: [phone]

## 2019-07-05 NOTE — TOC Initial Note (Signed)
Transition of Care [facility]) - Initial/Assessment Note    Patient Details  Name: Kim Anthony [name]: [id number] Date of Birth: 11/03/1950  Transition of Care [facility]) CM/SW Contact:    Marilu Favre, RN Phone Number: 07/05/2019, 12:34 PM  Clinical Narrative:                 Confirmed face sheet information with patient. Lives with daughter. PCP is Family Medicine at [facility]. Independent does not use any DME.  Expected Discharge Plan: Home/Self Care Barriers to Discharge: Continued Medical Work up   Patient Goals and CMS Choice Patient states their goals for this hospitalization and ongoing recovery are:: to return to home CMS Medicare.gov Compare Post Acute Care list provided to:: Patient Choice offered to / list presented to : NA  Expected Discharge Plan and Services Expected Discharge Plan: Home/Self Care       Living arrangements for the past 2 months: Single Family Home                 DME Arranged: N/A         HH Arranged: NA          Prior Living Arrangements/Services Living arrangements for the past 2 months: Single Family Home Lives with:: Adult Children Patient language and need for interpreter reviewed:: Yes Do you feel safe going back to the place where you live?: Yes      Need for Family Participation in Patient Care: Yes (Comment) Care giver support system in place?: Yes (comment)   Criminal Activity/Legal Involvement Pertinent to Current Situation/Hospitalization: No - Comment as needed  Activities of Daily Living Home Assistive Devices/Equipment: None ADL Screening (condition at time of admission) Patient's cognitive ability adequate to safely complete daily activities?: Yes Is the patient deaf or have difficulty hearing?: No Does the patient have difficulty seeing, even when wearing glasses/contacts?: No Does the patient have difficulty concentrating, remembering, or making decisions?: No Patient able to express need for assistance with ADLs?: No Does  the patient have difficulty dressing or bathing?: No Independently performs ADLs?: Yes (appropriate for developmental age) Does the patient have difficulty walking or climbing stairs?: No Weakness of Legs: Right Weakness of Arms/Hands: None  Permission Sought/Granted   Permission granted to share information with : No              Emotional Assessment Appearance:: Appears stated age Attitude/Demeanor/Rapport: Engaged Affect (typically observed): Accepting Orientation: : Oriented to Self, Oriented to Place, Oriented to  Time, Oriented to Situation Alcohol / Substance Use: Not Applicable Psych Involvement: No (comment)  Admission diagnosis:  Pulmonary embolism (HCC) [I26.99] PE (pulmonary thromboembolism) ([place]) [I26.99] Chronic anticoagulation [Z79.01] Patient Active Problem List   Diagnosis Date Noted  . Thoracic ascending aortic aneurysm ([place]) 07/05/2019  . Bilateral pneumonia 06/26/2019  . Pustular rash 03/09/2019  . Anemia 02/16/2019  . Breast cancer ([place])   . Acquired absence of left breast 12/20/2018  . Open wound of axillary region 05/11/2018  . COPD (chronic obstructive pulmonary disease) ([place]) 11/25/2017  . S/P MVR (mitral valve replacement)   . Chronic anticoagulation   . Tobacco use disorder 02/25/2017  . Lymphadenopathy, mediastinal 02/25/2017  . History of mitral valve replacement with mechanical valve 11/18/2016  . Essential hypertension 02/01/2016  . Allergic rhinitis 02/01/2016  . Healthcare maintenance 01/30/2016  . Chronic low back pain 07/22/2015  . Pulmonary embolism ([place]) 07/11/2015  . Chronic combined systolic and diastolic congestive heart failure ([place]) 07/11/2015  . Headache, common migraine 07/11/2015  .  HLD (hyperlipidemia) 07/11/2015   PCP:  Marianna Payment, MD Pharmacy:   [place], [place] [facility] [address] [place] [address] Phone: [phone] Fax: [place], [place] [address] [place] [address] Phone: [phone] Fax: [phone]     Social Determinants of Health (SDOH) Interventions    Readmission Risk Interventions Readmission Risk Prevention Plan 02/01/2019  Transportation Screening Complete  PCP or Specialist Appt within 3-5 Days Complete  HRI or [place] Complete  Social Work Consult for [place] Planning/Counseling Patient refused  Palliative Care Screening Not Applicable  Medication Review Press photographer) Complete  Some recent data might be hidden

## 2019-07-05 NOTE — Care Management Obs Status (Signed)
[facility] NOTIFICATION   Patient Details  Name: Kim Anthony [name]: [id number] Date of Birth: 1951/05/07   Medicare Observation Status Notification Given:  Yes    Marilu Favre, RN 07/05/2019, 12:32 PM

## 2019-07-05 NOTE — Plan of Care (Signed)

## 2019-07-05 NOTE — Progress Notes (Addendum)
ANTICOAGULATION CONSULT NOTE - Follow Up Consult  Pharmacy Consult for Transition from Heparin to Lovenox Indication: hx of PE (2017) and MVR  Allergies  Allergen Reactions  . Acetaminophen-Codeine Itching  . Propoxyphene Itching    Darvocet  . Tape Rash    Plastic  . Tramadol Itching and Nausea And Vomiting    Patient Measurements: Height: 5\' 5"  (165.1 cm) Weight: 165 lb 5.5 oz (75 kg) IBW/kg (Calculated) : 57 Heparin Dosing Weight: 72.4 kg  Vital Signs: Temp: 98.3 F (36.8 C) (01/13 1156) Temp Source: Oral (01/13 1156) BP: 116/63 (01/13 1156) Pulse Rate: 79 (01/13 1156)  Labs: Recent Labs    07/04/19 0707 07/04/19 1202 07/04/19 2235 07/05/19 0539  HGB 12.3  --   --  12.3  HCT 40.0  --   --  38.7  PLT 403*  --   --  410*  LABPROT 20.7*  --   --  19.7*  INR 1.8*  --   --  1.7*  HEPARINUNFRC  --   --  0.74* 0.81*  CREATININE 0.81  --   --  0.94  TROPONINIHS 12 11  --   --     Estimated Creatinine Clearance: 57.2 mL/min (by C-G formula based on SCr of 0.94 mg/dL).  Assessment:  Anticoag: hx of PE (2017) and MVR. Hep level 0.74>>0.84 up more today. Hgb 12.3 stable. INR pending - PTA warfarin 5 mg MWF; 2.5 mg all other days. Admit INR 1.8 (Goal 2.5 - 3.5)  Pharmacy consulted to transition patient from heparin to enoxaparin   Goal of Therapy:  Monitor platelets by anticoagulation protocol: Yes   Plan:  Lovenox 75 mg (1mg /kg) subq q12hr D/c IV heparin when first dose of Lovenox is administered Warfarin 7.5 mg po x 1 tonight Monitor daily INR and CBC   Alanda Slim, PharmD, [facility] Clinical Pharmacist Please see AMION for all Pharmacists' Contact Phone Numbers 07/05/2019, 1:35 PM

## 2019-07-05 NOTE — Progress Notes (Addendum)
   Subjective:  Evaluated patient at rounds this morning. Patient complains of some persistent chest tightness but denies any shortness of breath or leg swelling. Patient denies any questions at this time and is understanding of the plan moving forward.  Objective:  Vital signs in last 24 hours: Vitals:   07/04/19 1745 07/04/19 1900 07/05/19 0606 07/05/19 0851  BP: (!) 152/94 (!) 146/86 123/88 (!) 143/85  Pulse: 78 72 69 70  Resp: (!) 22 20 20    Temp:  98.6 F (37 C) 97.9 F (36.6 C)   TempSrc:  Oral Oral   SpO2: 91% 97% 96%   Weight:      Height:       Weight change:   Intake/Output Summary (Last 24 hours) at 07/05/2019 1044 Last data filed at 07/05/2019 0300 Gross per 24 hour  Intake 1145.74 ml  Output --  Net 1145.74 ml   Physical Exam  Constitutional: She is oriented to person, place, and time and well-developed, well-nourished, and in no distress. No distress.  HENT:  Head: Normocephalic and atraumatic.  Eyes: Pupils are equal, round, and reactive to light. Conjunctivae are normal.  Cardiovascular: Normal rate and regular rhythm.  Murmur (diastolic heard best at LLSB) heard. Pulmonary/Chest: Effort normal. No respiratory distress. She has no wheezes. She exhibits no tenderness.  Abdominal: Soft. Bowel sounds are normal. There is no abdominal tenderness.  Musculoskeletal:        General: Normal range of motion.     Cervical back: Normal range of motion and neck supple.  Neurological: She is alert and oriented to person, place, and time.  Skin: Skin is warm and dry. No rash noted.  Vitals reviewed.  Assessment/Plan:  Principal Problem:   Pulmonary embolism (HCC) Active Problems:   Chronic combined systolic and diastolic congestive heart failure (HCC)   Essential hypertension   Tobacco use disorder   COPD (chronic obstructive pulmonary disease) (HCC)   S/P MVR (mitral valve replacement)   Chronic anticoagulation  Chest pain and shortness of breath 2/2 PE:  -  heparin 1per pharmacy - warfarin 7.5 mg PO qd - heparin level  - INR checks qd - continuous pulse oximetry - tele - CBC qAM - BMP qAM   Prominence of the ascending thoracic aorta measuring 4.1 x 4.0 cm in diameter: - Recommend annual imaging followup by CTA or MRA   COPD:  - Continue home albuterol 2.5 mg q6h prn - Continue home Ellipta 62.5-25 mcg 1 puff inhalation qd    Chronic combined CHF:  - Continue home furosemide 40 mg qd - Continue home metoprolol succinate 12.5 mg qd - Continue home lisinopril 10 mg qd   CAD: - Continue simvastatin 20 mg qd - Continue metoprolol succinate 12.5 mg qd   HTN: Patient hypertensive to 157/100 mmHg on admission.  - Continue home metoprolol succinate 12.5 mg qd - Continue home lisinopril 10 mg qd - Hydralazine 10 mg IV q4h prn of SBP>180 or DBP>100     Dispo: Admit patient to Observation with expected length of stay less than 2 midnights.   LOS: 0 days   Bebe Liter, MS3 07/05/2019, 10:44 AM

## 2019-07-06 ENCOUNTER — Telehealth: Payer: Self-pay | Admitting: Internal Medicine

## 2019-07-06 DIAGNOSIS — Z885 Allergy status to narcotic agent status: Secondary | ICD-10-CM

## 2019-07-06 DIAGNOSIS — Z91048 Other nonmedicinal substance allergy status: Secondary | ICD-10-CM

## 2019-07-06 DIAGNOSIS — Z952 Presence of prosthetic heart valve: Secondary | ICD-10-CM

## 2019-07-06 DIAGNOSIS — I77810 Thoracic aortic ectasia: Secondary | ICD-10-CM

## 2019-07-06 DIAGNOSIS — Z86711 Personal history of pulmonary embolism: Secondary | ICD-10-CM

## 2019-07-06 DIAGNOSIS — Z720 Tobacco use: Secondary | ICD-10-CM

## 2019-07-06 LAB — PROTIME-INR
INR: 2.3 — ABNORMAL HIGH (ref 0.8–1.2)
Prothrombin Time: 24.9 seconds — ABNORMAL HIGH (ref 11.4–15.2)

## 2019-07-06 MED ORDER — ENOXAPARIN SODIUM 80 MG/0.8ML ~~LOC~~ SOLN: 1.0000 mg/kg | Freq: Two times a day (BID) | SUBCUTANEOUS | 0 refills | Status: DC

## 2019-07-06 MED ORDER — WARFARIN SODIUM 5 MG PO TABS: 5.0000 mg | ORAL_TABLET | Freq: Once | ORAL | Status: DC

## 2019-07-06 NOTE — Discharge Instructions (Signed)
You were admitted to the hospital for a blood clot in your lungs. You were treated with a medication that helps break up blood clots. You are being treated with Lovenox for 5 days until your Warfarin is at a high enough level to prevent further blood clots. Please follow up in the internal medicine clinic on the 19th at 9:45 AM for a hospital follow up and to meet with Dr. Elie Confer about your Wafarin at 10:30 AM.

## 2019-07-06 NOTE — Telephone Encounter (Signed)
HFU per Dr Madilyn Fireman; pt appt 07/11/19 945am/NW

## 2019-07-06 NOTE — Progress Notes (Signed)
ANTICOAGULATION CONSULT NOTE - Follow Up Consult  Pharmacy Consult for Transition from Heparin to Lovenox Indication: hx of PE (2017) and MVR  Allergies  Allergen Reactions  . Acetaminophen-Codeine Itching  . Propoxyphene Itching    Darvocet  . Tape Rash    Plastic  . Tramadol Itching and Nausea And Vomiting    Patient Measurements: Height: 5\' 5"  (165.1 cm) Weight: 165 lb 5.5 oz (75 kg) IBW/kg (Calculated) : 57 Heparin Dosing Weight: 72.4 kg  Vital Signs: Temp: 97.9 F (36.6 C) (01/14 0556) Temp Source: Oral (01/14 0556) BP: 130/83 (01/14 0556) Pulse Rate: 66 (01/14 0556)  Labs: Recent Labs    07/04/19 0707 07/04/19 1202 07/04/19 2235 07/05/19 0539 07/06/19 0234  HGB 12.3  --   --  12.3  --   HCT 40.0  --   --  38.7  --   PLT 403*  --   --  410*  --   LABPROT 20.7*  --   --  19.7* 24.9*  INR 1.8*  --   --  1.7* 2.3*  HEPARINUNFRC  --   --  0.74* 0.81*  --   CREATININE 0.81  --   --  0.94  --   TROPONINIHS 12 11  --   --   --     Estimated Creatinine Clearance: 57.2 mL/min (by C-G formula based on SCr of 0.94 mg/dL).  Assessment:  Anticoag: hx of PE (2017) and MVR. Hep level 0.74>>0.84 up more today. Hgb 12.3 stable. INR pending - PTA warfarin 5 mg MWF; 2.5 mg all other days. Admit INR 1.8 (Goal 2.5 - 3.5)  Pharmacy consulted to transition patient from heparin to enoxaparin   Goal of Therapy:  INR 2.5 - 3.5 Monitor platelets by anticoagulation protocol: Yes   Plan:  Continue Lovenox 75 mg (1mg /kg) subq q12hr Warfarin 5 mg po x 1 tonight Monitor daily INR and CBC   Alanda Slim, PharmD, [facility] Clinical Pharmacist Please see AMION for all Pharmacists' Contact Phone Numbers 07/06/2019, 7:57 AM

## 2019-07-06 NOTE — Progress Notes (Signed)
Subjective:   Pt seen at the bedside this AM. Pt states Kim Anthony feels good today. Kim Anthony is amendable to going home today. Kim Anthony understand Kim Anthony will continue Lovenox injections until her INR is theraputic on warfarin. Kim Anthony will follow up with Dr. Elie Confer. All concerns were addressed this AM.  Consults: na  Objective:  Vital signs in last 24 hours: Vitals:   07/05/19 1156 07/05/19 1816 07/05/19 2242 07/06/19 0556  BP: 116/63 125/71 121/72 130/83  Pulse: 79 80 72 66  Resp: 18 16 17 18   Temp: 98.3 F (36.8 C) 98.4 F (36.9 C) 97.8 F (36.6 C) 97.9 F (36.6 C)  TempSrc: Oral Oral Oral Oral  SpO2: 93% 92% 96% 96%  Weight:      Height:        Physical Exam  Constitutional: Kim Anthony is well-developed, well-nourished, and in no distress.  Cardiovascular: Normal rate and regular rhythm.  Pulmonary/Chest: Effort normal and breath sounds normal. No respiratory distress.  Abdominal: Soft. Kim Anthony exhibits no distension.  Musculoskeletal:        General: Normal range of motion.     Cervical back: Normal range of motion.  Neurological: Kim Anthony is alert.  Skin: Skin is warm and dry. Kim Anthony is not diaphoretic.  Psychiatric: Affect normal.  Nursing note and vitals reviewed.  I/Os:  Intake/Output Summary (Last 24 hours) at 07/06/2019 1017 Last data filed at 07/06/2019 0300 Gross per 24 hour  Intake 480 ml  Output --  Net 480 ml   Labs: Results for orders placed or performed during the hospital encounter of 07/04/19 (from the past 24 hour(s))  Magnesium     Status: None   Collection Time: 07/05/19  5:29 PM  Result Value Ref Range   Magnesium 2.2 1.7 - 2.4 mg/dL  Phosphorus     Status: None   Collection Time: 07/05/19  5:29 PM  Result Value Ref Range   Phosphorus 3.1 2.5 - 4.6 mg/dL  D-dimer, quantitative (not at [facility])     Status: None   Collection Time: 07/05/19  5:29 PM  Result Value Ref Range   D-Dimer, Quant 0.36 0.00 - 0.50 ug/mL-FEU  Protime-INR     Status: Abnormal   Collection Time: 07/06/19   2:34 AM  Result Value Ref Range   Prothrombin Time 24.9 (H) 11.4 - 15.2 seconds   INR 2.3 (H) 0.8 - 1.2    Assessment/Plan:  Assessment: Kim Anthony is a 69 yo F w/ a PMHx of PE, chronic combined systolic and diastolic HF, HTN, COPD, CAD, s/p MVR and tobacco use disorder who presents with chest pain and SOB found to have a LUL PE in setting of subtherapeutic warfarin originally on heparin bride to warfarin switched to lovenox bridge to warfarin.   Plan: Principal Problem:   Pulmonary embolism (HCC) Active Problems:   Chronic combined systolic and diastolic congestive heart failure (HCC)   Essential hypertension   Tobacco use disorder   COPD (chronic obstructive pulmonary disease) (HCC)   S/P MVR (mitral valve replacement)   Chronic anticoagulation   Thoracic ascending aortic aneurysm (HCC)  PE: -warfarin 7.5 mg PO qd -switching from heparin to lovenox bridge, 75 mg BID for 5 days, day 2 -quick follow up with Dr. Elie Confer in Coumadin clinic  COPD: -continue home albuterol and Ellipta  CHF: -continue home furosemide, metop and lisinopril  CAD: -continue home statin and metop  HTN: -continue home furosemide, metop, lisinopril  Dispo: Anticipated discharge today.   Al Decant, MD 07/06/2019, 10:17  AM Pager: [phone]

## 2019-07-10 ENCOUNTER — Telehealth: Payer: Self-pay | Admitting: *Deleted

## 2019-07-10 NOTE — Telephone Encounter (Signed)
Received on (07/05/19) Order Console Reports via of fax from [facility] requesting signature,date, and return.  Given to provider to sign.   Order Console Report signed and faxed on (07/07/19) to [facility].  Confirmation received.//AB/CMA

## 2019-07-11 ENCOUNTER — Encounter: Payer: Self-pay | Admitting: Internal Medicine

## 2019-07-11 ENCOUNTER — Ambulatory Visit: Payer: Medicare Other

## 2019-07-11 ENCOUNTER — Other Ambulatory Visit: Payer: Self-pay | Admitting: Internal Medicine

## 2019-07-11 ENCOUNTER — Other Ambulatory Visit: Payer: Self-pay | Admitting: Pharmacist

## 2019-07-12 DIAGNOSIS — M545 Low back pain: Secondary | ICD-10-CM | POA: Diagnosis not present

## 2019-07-14 ENCOUNTER — Telehealth: Payer: Self-pay | Admitting: Pharmacist

## 2019-07-14 NOTE — Telephone Encounter (Signed)
Received text from patient's daughter indicating PST FS POC INR = 3.1 after 3 consecutive days of 2x5mg  warfarin (10mg  dose), with LMWH 1mg /kg SQ Q12h bridge. LMWH to be D/C'ed. Give 1and 1/2 x 5mg  (7.5mg  warfarin) on Th/Sa; 2x5mg  (10mg  dose) on Fri/Su.Repeat PST FS POC INR on Monday 25JAN21.

## 2019-07-17 ENCOUNTER — Telehealth: Payer: Self-pay | Admitting: Pharmacist

## 2019-07-17 NOTE — Telephone Encounter (Signed)
Pt's daughter texted me results of PST FS POC INR 3.6. Will give 57.5mg  warfarin/wk, repeat INR in 1 wk. No bleeding.

## 2019-07-24 ENCOUNTER — Telehealth: Payer: Self-pay | Admitting: Pharmacist

## 2019-07-24 NOTE — Telephone Encounter (Signed)
Contacted patient regarding Dr. Gladstone Pih note. Daughter, [name], reports that they will be getting some green tea and eating some dark green leafy vegetables. They report no changes to patients medical history, no fevers, chills, nausea, vomiting, SOB, CP, or other symptoms. No changes to her medications recently. She has been having some bowel movements and reports that her hemorrhoids came out. They report a small amount of bleeding, only when she wipes. No hemoptysis, hematemesis, hematuria or other signs of bleeding. No new rashes that they note. Discussed that I agree with Dr. Gladstone Pih plan. Informed her to monitor for any worsening signs or symptoms of bleeding and to come into the ED if this occurs. She will call into clinic tomorrow to repeat INR.

## 2019-07-24 NOTE — Telephone Encounter (Signed)
Texted by the patient's daughter at 6:52PM with photo screen-shot of patient self-testing, point of care INR showing > 8 (88sec PT). I advised Monica to give a bottle of Lipton's Green Tea and a dark green leafy salad NOW. See documentation notes. I called the pager listed in [place] (Dr. Shan Levans) and Dr. Jerl Mina returned my page. We discussed this case. Patient cites they are having what they characterize as hemorrhoidal bleeding/spotting on tissue. Denies any other source of bleeding. She denies fever (hypermetabolic state that causes increased clearance of endogenous vitamin K clotting factors that the liver synthesizes); denies any new medications/no new antibiotics or any new medication per daughter/patient. I advised the patient/daughter--if bleeding were to occur, report to the ED immediately. Dr. Jerl Mina states she is going to review the patient in Epic/CHL and decide if this management strategy is sufficient or if physician(s) feel the patient should report to the ED tonight. Daughter was told that if patient does not come to the ED tonight, that she must come to the [facility] tomorrow. Finally, Dr. Jerl Mina was apprised that fingerstick point of care devices loose their correlation at high values, and that laboratory venous drawn PT/INR might very well be less than 8.

## 2019-07-25 ENCOUNTER — Other Ambulatory Visit (INDEPENDENT_AMBULATORY_CARE_PROVIDER_SITE_OTHER): Payer: Medicare Other

## 2019-07-25 ENCOUNTER — Other Ambulatory Visit: Payer: Self-pay

## 2019-07-25 ENCOUNTER — Other Ambulatory Visit: Payer: Self-pay | Admitting: Internal Medicine

## 2019-07-25 DIAGNOSIS — Z7901 Long term (current) use of anticoagulants: Secondary | ICD-10-CM

## 2019-07-25 LAB — PROTIME-INR
INR: 8.2 (ref 0.8–1.2)
Prothrombin Time: 68.5 seconds — ABNORMAL HIGH (ref 11.4–15.2)

## 2019-07-25 NOTE — Addendum Note (Signed)
Addended by: Truddie Crumble on: 07/25/2019 09:02 AM   Modules accepted: Orders

## 2019-07-27 ENCOUNTER — Encounter: Payer: Self-pay | Admitting: Physical Medicine and Rehabilitation

## 2019-07-27 ENCOUNTER — Telehealth: Payer: Self-pay | Admitting: Pharmacist

## 2019-07-27 ENCOUNTER — Telehealth: Payer: Self-pay

## 2019-07-27 NOTE — Telephone Encounter (Signed)
Received a fax report from [facility] PT/INR Self Testing Service:  INR test results 3.8 (test date 07/26/19). Will forward to Dr. Elie Confer and place in his box. SChaplin, RN,BSN

## 2019-07-27 NOTE — Telephone Encounter (Signed)
Texted my daughter at 5:50PM 3FEB21 with PST FS POC INR 3.8 after 2 consecutive days of HOLDING warfarin. Advised to take 5mg  on 3FEB21 and repeat PST FS POC INR on 4FEB21 and text/call me results when available.

## 2019-07-28 ENCOUNTER — Telehealth: Payer: Self-pay | Admitting: Pharmacist

## 2019-07-28 NOTE — Telephone Encounter (Signed)
Texted PST FS POC INR 3.8 at 6:58PM 5FEB21 (target 2.5 - 3.5). Advised to provide the patient 7.5mg  Friday 5FEB21; Saturday 6FEB21 and Sunday 7FEB21. Repeat PST FS POC INR

## 2019-07-29 NOTE — Telephone Encounter (Signed)
Thank you Dr. Groce 

## 2019-07-31 ENCOUNTER — Telehealth: Payer: Self-pay | Admitting: Pharmacist

## 2019-07-31 DIAGNOSIS — Z7901 Long term (current) use of anticoagulants: Secondary | ICD-10-CM | POA: Diagnosis not present

## 2019-07-31 DIAGNOSIS — I2782 Chronic pulmonary embolism: Secondary | ICD-10-CM | POA: Diagnosis not present

## 2019-07-31 DIAGNOSIS — Z952 Presence of prosthetic heart valve: Secondary | ICD-10-CM | POA: Diagnosis not present

## 2019-07-31 NOTE — Telephone Encounter (Signed)
Patient's daugher texted me results of PST FS POC INR = 3.5 (goal 2.5 - 3.5). Advised to commence the following regimen:  1 & 1/2 x 5mg  on Su/We/Fr; 1x5mg  all other days. Repeat on 15FEB21.

## 2019-08-03 ENCOUNTER — Other Ambulatory Visit: Payer: Self-pay | Admitting: Internal Medicine

## 2019-08-03 DIAGNOSIS — E876 Hypokalemia: Secondary | ICD-10-CM

## 2019-08-03 NOTE — Telephone Encounter (Signed)
dr groce addressed 2/8

## 2019-08-07 ENCOUNTER — Telehealth: Payer: Self-pay | Admitting: Pharmacist

## 2019-08-07 NOTE — Telephone Encounter (Signed)
Texted by patient's daughter at 19h tonight reporting PST FS POC INR 5.5 (target 2.5 - 3.5). Tonight's dose had already been taken by the patient per daughter. Advised to give a full bottle of Lipton's Green Tea as a vitamin K1 source. Advised to HOLD/OMIT dose for tomorrow (Tuesday 16FEB21) and repeat PST FS POC INR on Wednesday and report value EARLIER in day.

## 2019-08-08 ENCOUNTER — Telehealth: Payer: Self-pay | Admitting: Pharmacist

## 2019-08-08 ENCOUNTER — Other Ambulatory Visit: Payer: Self-pay

## 2019-08-08 ENCOUNTER — Emergency Department (HOSPITAL_COMMUNITY)
Admission: EM | Admit: 2019-08-08 | Discharge: 2019-08-09 | Disposition: A | Discharge status: Home / Self Care | Payer: Medicare Other | Attending: Emergency Medicine | Admitting: Emergency Medicine

## 2019-08-08 ENCOUNTER — Encounter (HOSPITAL_COMMUNITY): Payer: Self-pay | Admitting: Emergency Medicine

## 2019-08-08 ENCOUNTER — Encounter
Payer: Medicare Other | Attending: Physical Medicine and Rehabilitation | Admitting: Physical Medicine and Rehabilitation

## 2019-08-08 ENCOUNTER — Emergency Department (HOSPITAL_COMMUNITY): Payer: Medicare Other

## 2019-08-08 DIAGNOSIS — R079 Chest pain, unspecified: Secondary | ICD-10-CM | POA: Diagnosis not present

## 2019-08-08 DIAGNOSIS — Z853 Personal history of malignant neoplasm of breast: Secondary | ICD-10-CM | POA: Diagnosis not present

## 2019-08-08 DIAGNOSIS — R42 Dizziness and giddiness: Secondary | ICD-10-CM | POA: Diagnosis not present

## 2019-08-08 DIAGNOSIS — R0789 Other chest pain: Secondary | ICD-10-CM | POA: Diagnosis not present

## 2019-08-08 DIAGNOSIS — R791 Abnormal coagulation profile: Secondary | ICD-10-CM | POA: Diagnosis not present

## 2019-08-08 DIAGNOSIS — Z7901 Long term (current) use of anticoagulants: Secondary | ICD-10-CM | POA: Insufficient documentation

## 2019-08-08 DIAGNOSIS — Z79899 Other long term (current) drug therapy: Secondary | ICD-10-CM | POA: Insufficient documentation

## 2019-08-08 DIAGNOSIS — Z952 Presence of prosthetic heart valve: Secondary | ICD-10-CM | POA: Insufficient documentation

## 2019-08-08 DIAGNOSIS — I110 Hypertensive heart disease with heart failure: Secondary | ICD-10-CM | POA: Insufficient documentation

## 2019-08-08 DIAGNOSIS — E876 Hypokalemia: Secondary | ICD-10-CM | POA: Diagnosis not present

## 2019-08-08 DIAGNOSIS — J449 Chronic obstructive pulmonary disease, unspecified: Secondary | ICD-10-CM | POA: Diagnosis not present

## 2019-08-08 DIAGNOSIS — F17210 Nicotine dependence, cigarettes, uncomplicated: Secondary | ICD-10-CM | POA: Diagnosis not present

## 2019-08-08 DIAGNOSIS — R0602 Shortness of breath: Secondary | ICD-10-CM | POA: Diagnosis not present

## 2019-08-08 DIAGNOSIS — R61 Generalized hyperhidrosis: Secondary | ICD-10-CM | POA: Insufficient documentation

## 2019-08-08 DIAGNOSIS — I5042 Chronic combined systolic (congestive) and diastolic (congestive) heart failure: Secondary | ICD-10-CM | POA: Insufficient documentation

## 2019-08-08 LAB — URINALYSIS, ROUTINE W REFLEX MICROSCOPIC
Bilirubin Urine: NEGATIVE
Glucose, UA: NEGATIVE mg/dL
Hgb urine dipstick: NEGATIVE
Ketones, ur: NEGATIVE mg/dL
Nitrite: NEGATIVE
Protein, ur: NEGATIVE mg/dL
Specific Gravity, Urine: 1.003 — ABNORMAL LOW (ref 1.005–1.030)
pH: 6 (ref 5.0–8.0)

## 2019-08-08 LAB — PROTIME-INR
INR: 4.7 (ref 0.8–1.2)
Prothrombin Time: 44 seconds — ABNORMAL HIGH (ref 11.4–15.2)

## 2019-08-08 LAB — BASIC METABOLIC PANEL
Anion gap: 15 (ref 5–15)
BUN: 5 mg/dL — ABNORMAL LOW (ref 8–23)
CO2: 23 mmol/L (ref 22–32)
Calcium: 9.3 mg/dL (ref 8.9–10.3)
Chloride: 97 mmol/L — ABNORMAL LOW (ref 98–111)
Creatinine, Ser: 0.74 mg/dL (ref 0.44–1.00)
GFR calc Af Amer: >60 mL/min (ref >60)
GFR calc non Af Amer: >60 mL/min (ref >60)
Glucose, Bld: 106 mg/dL — ABNORMAL HIGH (ref 70–99)
Potassium: 2.9 mmol/L — ABNORMAL LOW (ref 3.5–5.1)
Sodium: 135 mmol/L (ref 135–145)

## 2019-08-08 LAB — CBC
HCT: 42.3 % (ref 36.0–46.0)
Hemoglobin: 13.7 g/dL (ref 12.0–15.0)
MCH: 31.9 pg (ref 26.0–34.0)
MCHC: 32.4 g/dL (ref 30.0–36.0)
MCV: 98.4 fL (ref 80.0–100.0)
Platelets: 471 10*3/uL — ABNORMAL HIGH (ref 150–400)
RBC: 4.3 MIL/uL (ref 3.87–5.11)
RDW: 12.2 % (ref 11.5–15.5)
WBC: 8.9 10*3/uL (ref 4.0–10.5)
nRBC: 0 % (ref 0.0–0.2)

## 2019-08-08 LAB — TROPONIN I (HIGH SENSITIVITY): Troponin I (High Sensitivity): 14 ng/L (ref <18)

## 2019-08-08 MED ORDER — SODIUM CHLORIDE 0.9% FLUSH: 3.0000 mL | Freq: Once | INTRAVENOUS | Status: DC

## 2019-08-08 NOTE — ED Triage Notes (Signed)
Patient reports SOB with chest tightness and occasional productive cough this evening , denies fever or chills.

## 2019-08-08 NOTE — Telephone Encounter (Signed)
Patient TOOK YESTERDAYS dose after reporting high INR of 5.50 yesterday (had already taken dose when the 5.50 INR was reported). I suspect the 7.9 today reflects the 7.5mg  warfarin she took yesterday. Have spoken with the patient's daughter, [name]. Patient/daughter KNOW NOT TO TAKE/GIVE ANY warfarin tonight. She (patient or daughter) is NOT reporting any signs or symptoms of bleeding. She denies fever. She denies COVID19 KNOWN exposures. She has NOT lost taste or smell. No GI symptoms. Daughter states her mom (the patient) "is coming down with a cold and doesn't feel well." Febrile illness can make INR go UP (hypoprothrombinemic response) because CLOTTING FACTORS VII, IX, X and II are more readily cleared in the setting of a "hyper-metabolic state, I.e. 'febrile illness'." They deny being on ANY new medications and they deny being on ANY antibiotics currently. I have spoken with the PGY2 IM Resident Physician on-call, Dr. Hayden Rasmussen. She has evaluated management strategies from Up-To-Date for INR >4.5 - 10.0 in ABSENCE of bleeding which calls only for OMITTING DOSE (as we are doing). During our discussion, the patient's daughter texted me, indicating that her mom is not feeling well with her "cold-symptoms," and has elected to go to the ED vs. Waiting until tomorrow to be seen in the [facility]. I shared this with Dr. Sharon Seller. IF for any reason there is a clinical decision made to night to administer vitamin K1 remember the following:  ORAL vitamin K1, 2.5mg  PO will take 24-48h to demonstrate its effect (lowering of the INR.) Should a more rapid approach become necessary, I recommend 0.5mg  vitamin K1 in 187mL of IVF administered minimally over one (1) hour. The INR may be evaluated six (6) hours after such a dose. This recommendation comes from an observational cohort study in the early '80s where 33 patients with marked hypoprothrombinemic response were divided in to two cohorts of 17 patients and  16 patients. The 17 patient cohort were administered 1mg  parenterally of vitamin K1, and 1/2 of these patient's INR values fell LESS than desired threshold (GOAL was to lower INR back INTO RANGE, NOT to "slam them back to baseline.") The next 16 patients were administered 0.5mg  vitamin K1 parenterally (in 142mL of IVF over one hour), ALL of these patient's INRs fell from "too high" to [place]. This is a means of LOWERING a patient's INR--NOT "reversing them" (unless that were to become necessary.) Jorene Guest, PharmD, CPP, Professor of Pharmacy, Clinical Assistant Professor of Medicine. Clinical Pharmacy Specialist-Anticoagulation, [facility]. Cell Phone [phone]

## 2019-08-09 ENCOUNTER — Telehealth: Payer: Self-pay | Admitting: Pharmacist

## 2019-08-09 DIAGNOSIS — R079 Chest pain, unspecified: Secondary | ICD-10-CM | POA: Diagnosis not present

## 2019-08-09 LAB — TROPONIN I (HIGH SENSITIVITY): Troponin I (High Sensitivity): 13 ng/L (ref <18)

## 2019-08-09 MED ORDER — ACETAMINOPHEN 500 MG PO TABS
1000.0000 mg | ORAL_TABLET | Freq: Once | ORAL | Status: AC
  Administered 2019-08-09: 1000 mg via ORAL
  Filled 2019-08-09: qty 2

## 2019-08-09 MED ORDER — OXYCODONE-ACETAMINOPHEN 5-325 MG PO TABS: 1.0000 | ORAL_TABLET | Freq: Four times a day (QID) | ORAL | 0 refills | Status: DC | PRN

## 2019-08-09 MED ORDER — POTASSIUM CHLORIDE CRYS ER 20 MEQ PO TBCR
40.0000 meq | EXTENDED_RELEASE_TABLET | Freq: Once | ORAL | Status: AC
  Administered 2019-08-09: 40 meq via ORAL
  Filled 2019-08-09: qty 2

## 2019-08-09 MED ORDER — OXYCODONE HCL 5 MG PO TABS
5.0000 mg | ORAL_TABLET | Freq: Once | ORAL | Status: AC
  Administered 2019-08-09: 5 mg via ORAL
  Filled 2019-08-09: qty 1

## 2019-08-09 MED ORDER — POTASSIUM CHLORIDE CRYS ER 20 MEQ PO TBCR: 40.0000 meq | EXTENDED_RELEASE_TABLET | Freq: Every day | ORAL | 0 refills | Status: DC

## 2019-08-09 NOTE — Telephone Encounter (Signed)
Texted at 6:55PM by patient's daughter citing PST POC FS INR = 3.5 (goal 2.5 - 3.5) after consecutive days omitted dosing due to hypoprothrombinemia. Advised daugther to give 5mg  warfarin tonight. No bleeding reported.  Recheck PST FS POC INR Th 18FEB21.

## 2019-08-09 NOTE — ED Provider Notes (Signed)
[facility] EMERGENCY DEPARTMENT Provider Note   CSN: [id number] Arrival date & time: 08/08/19  2003     History Chief Complaint  Patient presents with  . Shortness of Breath    Kim Anthony is a 69 y.o. female.  69 y/o female with hx of CAD, CHF, PE (on chronic coumadin), mechanical MVR, HLD, left breast CA s/p mastectomy complicated by abscess presents to the ED c/o chest pain. Patient reports sudden onset of a sharp chest pain in her left chest radiating to her back. Symptoms associated with lightheadedness, diaphoresis. Pain aggravated with movements, but has been spontaneously improving without intervention. Believes the sudden onset of her pain also triggered her anxiety and got her "worked up". No fevers, syncope, leg swelling, hemoptysis. Has been compliant with her Coumadin, but has been out of her potassium tablets x 3 days.  The history is provided by the patient. No language interpreter was used.  Shortness of Breath      Past Medical History:  Diagnosis Date  . Arthritis   . Asthma   . Breast cancer ([place]) 2001   Left Breast Cancer  . CHF (congestive heart failure) ([place])   . COPD (chronic obstructive pulmonary disease) ([place])   . Coronary artery disease   . History of mitral valve replacement with mechanical valve   . HLD (hyperlipidemia)   . Hypertension   . Pulmonary embolism [facility])     Patient Active Problem List   Diagnosis Date Noted  . Thoracic ascending aortic aneurysm ([place]) 07/05/2019  . Bilateral pneumonia 06/26/2019  . Pustular rash 03/09/2019  . Anemia 02/16/2019  . Breast cancer ([place])   . Acquired absence of left breast 12/20/2018  . Open wound of axillary region 05/11/2018  . COPD (chronic obstructive pulmonary disease) ([place]) 11/25/2017  . S/P MVR (mitral valve replacement)   . Chronic anticoagulation   . Tobacco use disorder 02/25/2017  . Lymphadenopathy, mediastinal 02/25/2017  . History of mitral valve replacement with  mechanical valve 11/18/2016  . Essential hypertension 02/01/2016  . Allergic rhinitis 02/01/2016  . Healthcare maintenance 01/30/2016  . Chronic low back pain 07/22/2015  . Pulmonary embolism ([place]) 07/11/2015  . Chronic combined systolic and diastolic congestive heart failure ([place]) 07/11/2015  . Headache, common migraine 07/11/2015  . HLD (hyperlipidemia) 07/11/2015    Past Surgical History:  Procedure Laterality Date  . APPLICATION OF A-CELL OF CHEST/ABDOMEN Left 01/27/2019   Procedure: APPLICATION OF A-CELL OF CHEST;  Surgeon: Wallace Going, DO;  Location: [place];  Service: Plastics;  Laterality: Left;  . APPLICATION OF WOUND VAC N/A 01/27/2019   Procedure: APPLICATION OF WOUND VAC;  Surgeon: Wallace Going, DO;  Location: [place];  Service: Plastics;  Laterality: N/A;  . COLONOSCOPY WITH PROPOFOL N/A 06/07/2018   Procedure: COLONOSCOPY WITH PROPOFOL;  Surgeon: Wilford Corner, MD;  Location: [facility];  Service: Endoscopy;  Laterality: N/A;  . DEBRIDEMENT AND CLOSURE WOUND Left 12/28/2018   Procedure: Debridement And Closure Wound;  Surgeon: Coralie Keens, MD;  Location: [place];  Service: General;  Laterality: Left;  . INCISION AND DRAINAGE OF WOUND Left 01/27/2019   Procedure: IRRIGATION AND DEBRIDEMENT OF CHEST WALL;  Surgeon: Wallace Going, DO;  Location: [place];  Service: Plastics;  Laterality: Left;  . LATISSIMUS FLAP TO BREAST Left 01/18/2019   Procedure: LEFT LATISSIMUS FLAP TO BREAST;  Surgeon: Wallace Going, DO;  Location: [place];  Service: Plastics;  Laterality: Left;  [name] MASTECTOMY Left 2000s  for breast cancer, also s/p radiation and chemo  . MITRAL VALVE REPLACEMENT     mechanical  . POLYPECTOMY  06/07/2018   Procedure: POLYPECTOMY;  Surgeon: Wilford Corner, MD;  Location: [facility];  Service: Endoscopy;;  . WOUND DEBRIDEMENT Left 12/28/2018   Procedure: EXCISION OF LEFT CHRONIC CHEST WALL WOUND;  Surgeon: Coralie Keens, MD;  Location: [place];  Service: General;  Laterality: Left;     OB History    Gravida  2   Para  2   Term  2   Preterm      AB      Living        SAB      TAB      Ectopic      Multiple      Live Births  2           Family History  Problem Relation Age of Onset  . Heart attack Other   . Breast cancer Maternal Aunt 17    Social History   Tobacco Use  . Smoking status: Current Every Day Smoker    Packs/day: 0.25    Years: 30.00    Pack years: 7.50    Types: Cigarettes  . Smokeless tobacco: Never Used  . Tobacco comment: Sometimes less.  Substance Use Topics  . Alcohol use: Yes    Comment: beer- ocasional  . Drug use: No    Home Medications Prior to Admission medications   Medication Sig Start Date End Date Taking? Authorizing Provider  albuterol (PROVENTIL) (2.5 MG/3ML) 0.083% nebulizer solution Take 3 mLs (2.5 mg total) by nebulization every 6 (six) hours as needed for wheezing or shortness of breath. 06/26/19   Seawell, Jaimie A, DO  albuterol (VENTOLIN HFA) 108 (90 Base) MCG/ACT inhaler Inhale 1-2 puffs into the lungs every 6 (six) hours as needed for wheezing or shortness of breath. 06/26/19   Seawell, Jaimie A, DO  ANORO ELLIPTA 62.5-25 MCG/INH AEPB Inhale 1 puff by mouth once daily Patient taking differently: Inhale 1 puff into the lungs daily.  08/25/18   Isabelle Course, MD  Calcium-Phosphorus-Vitamin D (CITRACAL +D3 PO) Take 1 tablet by mouth 2 (two) times a day.    [provider]  enoxaparin (LOVENOX) 80 MG/0.8ML injection Inject 0.75 mLs (75 mg total) into the skin every 12 (twelve) hours for 7 doses. 07/06/19 07/10/19  Al Decant, MD  fluticasone (FLONASE) 50 MCG/ACT nasal spray Place 2 sprays into both nostrils daily. Patient taking differently: Place 1 spray into both nostrils daily as needed for allergies.  12/03/16 04/11/26  Jule Ser, DO  furosemide (LASIX) 40 MG tablet Take 1 tablet (40 mg total) by mouth daily. 02/16/19 02/16/20  Marianna Payment,  MD  gabapentin (NEURONTIN) 600 MG tablet Take 1 tablet (600 mg total) by mouth 3 (three) times daily. 06/26/19   Seawell, Jaimie A, DO  lisinopril (ZESTRIL) 10 MG tablet Take 1 tablet (10 mg total) by mouth daily. 02/16/19 02/16/20  Marianna Payment, MD  metoprolol succinate (TOPROL-XL) 25 MG 24 hr tablet Take 0.5 tablets (12.5 mg total) by mouth daily. TAKE 1/2 (ONE-HALF) TABLET BY MOUTH ONCE DAILY 07/12/19   Marianna Payment, MD  polyethylene glycol (MIRALAX / GLYCOLAX) 17 g packet Take 17 g by mouth daily as needed for mild constipation. 02/01/19   Monica Becton, MD  potassium chloride SA (KLOR-CON) 20 MEQ tablet Take 2 tablets by mouth once daily 08/04/19   Marianna Payment, MD  simvastatin (ZOCOR) 20  MG tablet Take 1 tablet by mouth once daily 06/07/19   Marianna Payment, MD  warfarin (COUMADIN) 5 MG tablet TAKE 1 AND 1/2 TABLETS BY MOUTH ON SUNDAYS, TUESDAYS, THURSDAYS AND SATURDAYS. MONDAYS, [facility] AND FRIDAYS TAKE ONLY 1 TABLET 07/11/19   Jorene Guest B, RPH-CPP    Allergies    Acetaminophen-codeine, Propoxyphene, Tape, and Tramadol  Review of Systems   Review of Systems  Respiratory: Positive for shortness of breath.   Ten systems reviewed and are negative for acute change, except as noted in the HPI.    Physical Exam Updated Vital Signs BP (!) 147/126   Pulse 85   Temp 98.7 F (37.1 C) (Oral)   Resp (!) 25   SpO2 94%   Physical Exam Vitals and nursing note reviewed.  Constitutional:      General: She is not in acute distress.    Appearance: She is well-developed. She is not diaphoretic.     Comments: Patient nontoxic appearing  HENT:     Head: Normocephalic and atraumatic.  Eyes:     General: No scleral icterus.    Conjunctiva/sclera: Conjunctivae normal.  Cardiovascular:     Comments: NSR. Metallic "clicking" sound c/w hx of MVR. Pulmonary:     Effort: Pulmonary effort is normal. No respiratory distress.     Comments: Left breast is absent with large scar from prior  surgery. Associated TTP of the left chest. No crepitus or deformity. Lungs CTAB. Musculoskeletal:        General: Normal range of motion.     Cervical back: Normal range of motion.     Comments: No BLE edema.  Skin:    General: Skin is warm and dry.     Coloration: Skin is not pale.     Findings: No erythema or rash.  Neurological:     Mental Status: She is alert and oriented to person, place, and time.  Psychiatric:        Mood and Affect: Mood is anxious.        Behavior: Behavior normal.     ED Results / Procedures / Treatments   Labs (all labs ordered are listed, but only abnormal results are displayed) Labs Reviewed  BASIC METABOLIC PANEL - Abnormal; Notable for the following components:      Result Value   Potassium 2.9 (*)    Chloride 97 (*)    Glucose, Bld 106 (*)    BUN 5 (*)    All other components within normal limits  CBC - Abnormal; Notable for the following components:   Platelets 471 (*)    All other components within normal limits  PROTIME-INR - Abnormal; Notable for the following components:   Prothrombin Time 44.0 (*)    INR 4.7 (*)    All other components within normal limits  URINALYSIS, ROUTINE W REFLEX MICROSCOPIC - Abnormal; Notable for the following components:   Color, Urine STRAW (*)    Specific Gravity, Urine 1.003 (*)    Leukocytes,Ua TRACE (*)    Bacteria, UA RARE (*)    All other components within normal limits  TROPONIN I (HIGH SENSITIVITY)  TROPONIN I (HIGH SENSITIVITY)    EKG EKG Interpretation  Date/Time:  Tuesday August 08 2019 20:24:16 EST Ventricular Rate:  88 PR Interval:  142 QRS Duration: 116 QT Interval:  448 QTC Calculation: 542 R Axis:   -53 Text Interpretation: Sinus rhythm with frequent Premature ventricular complexes Incomplete right bundle branch block Left anterior fascicular block Minimal voltage  criteria for LVH, may be normal variant ( Cornell product ) ST & T wave abnormality, consider anterolateral ischemia  Prolonged QT Abnormal ECG Changes noted from prior Confirmed by Thayer Jew [id number]) on 08/09/2019 2:39:20 AM   Radiology DG Chest Portable 1 View  Result Date: 08/08/2019 CLINICAL DATA:  Shortness of breath, chest tightness EXAM: PORTABLE CHEST 1 VIEW COMPARISON:  Chest radiographs and CT chest, 07/04/2019 FINDINGS: Cardiomegaly status post median sternotomy with mitral valve prosthesis. Emphysematous change and homogeneous opacity of the peripheral left upper lobe, in keeping with subpleural radiation fibrosis seen on prior CT. The visualized skeletal structures are unremarkable. IMPRESSION: 1.  Cardiomegaly without acute abnormality of the lungs. 2.  Emphysema. Electronically Signed   By: Eddie Candle M.D.   On: 08/08/2019 21:47    Procedures Procedures (including critical care time)  Medications Ordered in ED Medications  sodium chloride flush (NS) 0.9 % injection 3 mL (has no administration in time range)  acetaminophen (TYLENOL) tablet 1,000 mg (1,000 mg Oral Given 08/09/19 0156)  potassium chloride SA (KLOR-CON) CR tablet 40 mEq (40 mEq Oral Given 08/09/19 0157)    ED Course  I have reviewed the triage vital signs and the nursing notes.  Pertinent labs & imaging results that were available during my care of the patient were reviewed by me and considered in my medical decision making (see chart for details).  Clinical Course as of Aug 15 519  Wed Aug 09, 2019  0200 Supplemental K+ ordered for hypokalemia. Patient has been out of her potassium tablets for the past few days.   [KH]  0408 Pain has improved with 5mg  oxycodone. Patient resting comfortably. Agreeable and comfortable with outpatient follow up in the internal medicine clinic.   [KH]    Clinical Course User Index [KH] Antonietta Breach, PA-C   MDM Rules/Calculators/A&P                      Patient presents to the emergency department for evaluation of chest pain.  EKG appears stable compared to prior with negative  troponin x2.  Chest x-ray without evidence of mediastinal widening to suggest dissection.  No acute cardiopulmonary abnormality such as pneumothorax, pneumonia, pleural effusion.  Cardiomegaly seen on prior imaging.  Pulmonary embolus further considered; however, patient is chronically anticoagulated give her hx of PE.  Her INR today is supratherapeutic.  Patient's pain is quite reproducible on palpation to the left chest wall.  She has a history of left breast mastectomy complicated by abscess.  There appears to be a large amount of scar tissue in this area which may certainly be contributing to her discomfort.  Her pain has improved in the emergency department with a tablet of oxycodone.  Feel her symptoms can be further followed as an outpatient by her primary care doctor.  Will send home with a short course of pain medicine.  Return precautions discussed and provided. Patient discharged in stable condition with no unaddressed concerns.   Final Clinical Impression(s) / ED Diagnoses Final diagnoses:  Chest pain, unspecified type  Supratherapeutic INR  Hypokalemia    Rx / DC Orders ED Discharge Orders         Ordered    oxyCODONE-acetaminophen (PERCOCET/ROXICET) 5-325 MG tablet  Every 6 hours PRN     08/09/19 0413    potassium chloride SA (KLOR-CON) 20 MEQ tablet  Daily     08/09/19 0414           Antonietta Breach,  PA-C 08/15/19 [id number]    Merryl Hacker, MD 08/15/19 0530

## 2019-08-09 NOTE — Discharge Instructions (Addendum)
Your evaluation in the emergency department today has been reassuring.  We recommend close follow-up with your primary care doctor, especially for any persistent discomfort.  Have your doctor recheck your INR as it was elevated today at 4.7.  You may benefit from holding one dose of your coumadin to bring this level down to your target range. Return to the ED for new or concerning symptoms.

## 2019-08-10 ENCOUNTER — Telehealth: Payer: Self-pay | Admitting: Pharmacist

## 2019-08-10 NOTE — Telephone Encounter (Signed)
Daughter texted me PST FS POC INR = 2.9 (target 2.5 - 3.5). Will give 7.5mg  warfarin tonight. Repeat FS POC PST INR tomorrow. No bleeding.

## 2019-08-11 ENCOUNTER — Telehealth: Payer: Self-pay | Admitting: *Deleted

## 2019-08-11 NOTE — Telephone Encounter (Signed)
Second fax received from [facility] with results from 05/08/2019 to 08/10/2019. Last INR 2.9 on 08/10/2019. Again, Dr. Elie Confer is already aware. Hubbard Hartshorn, BSN, RN-BC

## 2019-08-11 NOTE — Telephone Encounter (Signed)
Received fax from [facility] with INR results from 05/01/2019 to 08/09/2019. Last INR 3.5 on 08/09/2019. Dr. Elie Confer is already aware and has spoken with patient's daughter regarding med adjustment. Results placed in Dr. Gladstone Pih office. Hubbard Hartshorn, BSN, RN-BC

## 2019-08-14 ENCOUNTER — Other Ambulatory Visit: Payer: Self-pay | Admitting: Pharmacist

## 2019-08-14 ENCOUNTER — Telehealth: Payer: Self-pay

## 2019-08-14 ENCOUNTER — Telehealth: Payer: Self-pay | Admitting: Pharmacist

## 2019-08-14 NOTE — Telephone Encounter (Signed)
Texted by patient's daughter with PST FS POC INR 4.2 after 3 successive days of 7.5mg  warfarin in reaction to last INR of 1.9 (goal 2.5 - 3.5). Will OMIT tonights dose. Resume tomorrow with 5mg  Tu/7.5mg  Wed. INR Th [id number]. Daughter denies any symptoms of bleeding.

## 2019-08-14 NOTE — Telephone Encounter (Signed)
Received a fax copy of INR results from [facility] PT/INR self testing service.   Test date and time of 08/11/19 @ 6:58pm, INR test Results:  1.5  Will forward to Dr. Elie Confer and place in his box. SChaplin, RN,BSN

## 2019-08-14 NOTE — Telephone Encounter (Signed)
Received text from patient's daughter, [name] at 6:59PM [id number] reporting PST FS POC INR 1.9.  Was advised at that time to give her mom 1 and 1/2 x 5mg  (7.5mg  dose) Fr/Sa/Su; Repeat INR Monday 22-FEB-21.

## 2019-08-16 ENCOUNTER — Encounter: Payer: Self-pay | Admitting: Internal Medicine

## 2019-08-16 ENCOUNTER — Ambulatory Visit (INDEPENDENT_AMBULATORY_CARE_PROVIDER_SITE_OTHER): Payer: Medicare Other | Admitting: Internal Medicine

## 2019-08-16 VITALS — BP 144/79 | HR 75 | Temp 98.4°F | Ht 65.0 in | Wt 173.2 lb

## 2019-08-16 DIAGNOSIS — M25559 Pain in unspecified hip: Secondary | ICD-10-CM

## 2019-08-16 DIAGNOSIS — Z952 Presence of prosthetic heart valve: Secondary | ICD-10-CM

## 2019-08-16 DIAGNOSIS — M25551 Pain in right hip: Secondary | ICD-10-CM

## 2019-08-16 DIAGNOSIS — F17210 Nicotine dependence, cigarettes, uncomplicated: Secondary | ICD-10-CM

## 2019-08-16 DIAGNOSIS — Z7901 Long term (current) use of anticoagulants: Secondary | ICD-10-CM | POA: Diagnosis not present

## 2019-08-16 DIAGNOSIS — Z79899 Other long term (current) drug therapy: Secondary | ICD-10-CM

## 2019-08-16 DIAGNOSIS — G8929 Other chronic pain: Secondary | ICD-10-CM

## 2019-08-16 MED ORDER — DULOXETINE HCL 60 MG PO CPEP: 60.0000 mg | ORAL_CAPSULE | Freq: Every day | ORAL | 0 refills | Status: DC

## 2019-08-16 NOTE — Progress Notes (Signed)
CC: Hip pain  HPI:  Kim Anthony is a 69 y.o. female with a past medical history stated below and presents today for hip pain. Please see problem based assessment and plan for additional details.   Past Medical History:  Diagnosis Date  . Arthritis   . Asthma   . Breast cancer ([place]) 2001   Left Breast Cancer  . CHF (congestive heart failure) ([place])   . COPD (chronic obstructive pulmonary disease) ([place])   . Coronary artery disease   . History of mitral valve replacement with mechanical valve   . HLD (hyperlipidemia)   . Hypertension   . Pulmonary embolism ([facility])     Family History  Problem Relation Age of Onset  . Heart attack Other   . Breast cancer Maternal Aunt 26     Social History   Socioeconomic History  . Marital status: Single    Spouse name: Not on file  . Number of children: Not on file  . Years of education: Not on file  . Highest education level: Not on file  Occupational History  . Not on file  Tobacco Use  . Smoking status: Current Every Day Smoker    Packs/day: 0.25    Years: 30.00    Pack years: 7.50    Types: Cigarettes  . Smokeless tobacco: Never Used  . Tobacco comment: Sometimes less.  Substance and Sexual Activity  . Alcohol use: Yes    Comment: beer- ocasional  . Drug use: No  . Sexual activity: Not Currently    Birth control/protection: None  Other Topics Concern  . Not on file  Social History Narrative   ** Merged History Encounter **       Social Determinants of Health   Financial Resource Strain:   . Difficulty of Paying Living Expenses: Not on file  Food Insecurity:   . Worried About Charity fundraiser in the Last Year: Not on file  . Ran Out of Food in the Last Year: Not on file  Transportation Needs:   . Lack of Transportation (Medical): Not on file  . Lack of Transportation (Non-Medical): Not on file  Physical Activity:   . Days of Exercise per Week: Not on file  . Minutes of Exercise per Session: Not on file    Stress:   . Feeling of Stress : Not on file  Social Connections:   . Frequency of Communication with Friends and Family: Not on file  . Frequency of Social Gatherings with Friends and Family: Not on file  . Attends Religious Services: Not on file  . Active Member of Clubs or Organizations: Not on file  . Attends Archivist Meetings: Not on file  . Marital Status: Not on file  Intimate Partner Violence:   . Fear of Current or Ex-Partner: Not on file  . Emotionally Abused: Not on file  . Physically Abused: Not on file  . Sexually Abused: Not on file     Review of Systems: ROS - negative except what is noted on assessment and plan.   Vitals:   08/16/19 1348  BP: (!) 144/79  Pulse: 75  Temp: 98.4 F (36.9 C)  TempSrc: Oral  SpO2: 96%  Weight: 173 lb 3.2 oz (78.6 kg)  Height: 5\' 5"  (1.651 m)     Physical Exam: Physical Exam  Constitutional: She is oriented to person, place, and time and well-developed, well-nourished, and in no distress.  HENT:  Head: Normocephalic and atraumatic.  Eyes:  EOM are normal.  Cardiovascular: Normal rate and intact distal pulses.  Pulmonary/Chest: Effort normal. No respiratory distress. She exhibits no tenderness.  Abdominal: Soft. She exhibits no distension. There is no abdominal tenderness.  Musculoskeletal:        General: Tenderness present. No edema. Normal range of motion.     Cervical back: Normal range of motion.     Comments: Patient has pain in the right inguinal region with increased internal and external rotation.   Neurological: She is alert and oriented to person, place, and time.  Skin: Skin is warm and dry.     Assessment & Plan:   See Encounters Tab for problem based charting.  Patient discussed with Dr. Evette Doffing

## 2019-08-16 NOTE — Telephone Encounter (Signed)
t has f/u and also will be seen again today. [name] has followed her INR

## 2019-08-16 NOTE — Patient Instructions (Signed)
Thank you, [name] for allowing [place] to provide your care today. Today we discussed Hip pain and Pulmonary Embolism.    I have ordered none labs for you. I will call if any are abnormal.    I have place a referrals to none.   I have ordered the following tests: none   I have ordered the following medication/changed the following medications:   1. Cymbalta 60 mg daily   Please follow-up in 1 month.    Should you have any questions or concerns please call the internal medicine clinic at [phone].    Kim Anthony, D.O. [facility]

## 2019-08-17 ENCOUNTER — Telehealth: Payer: Self-pay | Admitting: Pharmacist

## 2019-08-17 NOTE — Telephone Encounter (Signed)
Daugher texted me PST FS POC INR = 2.9 (goal 2.5 - 3.5), this after an OMITTED dose for previous INR of 4.2. Omitted one dose, gave 5mg , then 7.5mg  warfarin accounting for this INR. Advised the following dosing:  5mg  warfarin (tonight), alternating with 7.5mg  every-other-day. INR on Monday 1MAR21. No bleeding.  No new medications.

## 2019-08-21 ENCOUNTER — Telehealth: Payer: Self-pay | Admitting: Pharmacist

## 2019-08-21 NOTE — Telephone Encounter (Signed)
See phone note. PST FS POC INR 5.6 on 6.25mg  weighted daily avg. Over past 4 days. OMIT dose tonight, 2.5mg  Tu; 5mg  Wed; INR Th 4MAR21. No bleeding.

## 2019-08-21 NOTE — Telephone Encounter (Signed)
Patient's daughter texted PST FS INR results 5.6 (goal 2.5 - 3.5) after alternating 5mg  with 7.5mg  every other day x 4 days (25mg  over 4 days = 6.25mg  weighted daily avg. Will OMIT tonights dose; give 2.5mg  Tu/5mg  Wed/INR Thursday 4MAR21. No bleeding.

## 2019-08-23 ENCOUNTER — Encounter: Payer: Self-pay | Admitting: Internal Medicine

## 2019-08-23 DIAGNOSIS — M25559 Pain in unspecified hip: Secondary | ICD-10-CM | POA: Insufficient documentation

## 2019-08-23 NOTE — Assessment & Plan Note (Addendum)
Presents with right chronic hip pain.  She states that the pain waxes and wanes.  Pain does not radiate.  At times the pain is significant enough to limit her functional abilities including walking.  She takes gabapentin 600 mg and only endorses minimal improvement in her symptoms.  She states that her pain is 8 out of 10 today.  Physical exam the patient has pain in her right groin that is worsened by internal and external rotation at the hip joint.  She has a negative straight leg test. Patient has decreased range of motion limited by pain. She is neurovascularly intact.  Previous imaging shows bilateral hip osteoarthritis.  I counseled her regarding her diagnosis  Plan: - I counseled the patient regarding her hip osteoarthritis. We decided to do a trial of Cymbalta 60 mg. - Re-evaluate in 1 month

## 2019-08-23 NOTE — Assessment & Plan Note (Signed)
Patient is on chronic anticoagulation for mechanical heart valve. Patient's INR monitored by Dr. Elie Confer and she is tolerating it well.   Plan: - Continue anticoagulation with Warfarin.

## 2019-08-25 ENCOUNTER — Telehealth: Payer: Self-pay | Admitting: Pharmacist

## 2019-08-25 NOTE — Telephone Encounter (Signed)
Contacted last evening at home and daughter of patient reported PST FS POC INR 2.9 (goal 2.5 - 3.5) after having recommenced warfarin. The patient has had INR variability. Patient was advised to take 5mg  warfarin last evening and repeat PST FS POC INR this evening and provide me results to permit dosing for Friday, Saturday, Sunday with subsequent INR on Monday 8MAR21.

## 2019-08-28 ENCOUNTER — Telehealth: Payer: Self-pay | Admitting: Pharmacist

## 2019-08-28 DIAGNOSIS — Z7901 Long term (current) use of anticoagulants: Secondary | ICD-10-CM | POA: Diagnosis not present

## 2019-08-28 DIAGNOSIS — I2782 Chronic pulmonary embolism: Secondary | ICD-10-CM | POA: Diagnosis not present

## 2019-08-28 DIAGNOSIS — Z952 Presence of prosthetic heart valve: Secondary | ICD-10-CM | POA: Diagnosis not present

## 2019-08-28 NOTE — Telephone Encounter (Signed)
Patient's daughter texted results of PST FS POC INR at 7:40PM on 5-MAR-21 as 3.2 Daugher was instructed to give her mom the patient 1/2 x 5mg  (2.5mg  dose) that evening and repeat INR on 6-MAR-21. Have not received results as of 8-MAR-21.

## 2019-08-28 NOTE — Telephone Encounter (Signed)
Texted by patient's daughter with results of PST FS POC INR = 3.0 after 2.5mg /5mg /5mg  from Friday through yesterday. Will give 1/2 x 5mg  (2.5mg ) today; 5mg  on Tuesday; 1/2 x 5mg  (2.5 mg) on Wednesday with repeat INR on Thursday 11-MAR-21. No bleeding symptoms/signs. Target goal 2.5 - 3.5  INR within target range.

## 2019-08-29 NOTE — Progress Notes (Signed)
Internal Medicine Clinic Attending  Case discussed with Dr. Coe at the time of the visit.  We reviewed the resident's history and exam and pertinent patient test results.  I agree with the assessment, diagnosis, and plan of care documented in the resident's note.    

## 2019-08-31 ENCOUNTER — Ambulatory Visit: Payer: Medicare Other | Attending: Family

## 2019-08-31 DIAGNOSIS — Z23 Encounter for immunization: Secondary | ICD-10-CM

## 2019-08-31 NOTE — Progress Notes (Signed)
   Covid-19 Vaccination Clinic  Name:  Kim Anthony    [name]: [id number] DOB: May 24, 1951  08/31/2019  Kim Anthony was observed post Covid-19 immunization for 15 minutes without incident. She was provided with Vaccine Information Sheet and instruction to access the V-Safe system.   Kim Anthony was instructed to call 911 with any severe reactions post vaccine: [name] Difficulty breathing  . Swelling of face and throat  . A fast heartbeat  . A bad rash all over body  . Dizziness and weakness   Immunizations Administered    Name Date Dose VIS Date Route   Moderna COVID-19 Vaccine 08/31/2019  4:02 PM 0.5 mL 05/23/2019 Intramuscular   Manufacturer: Moderna   Lot: [id number]   [id number]

## 2019-09-04 ENCOUNTER — Telehealth: Payer: Self-pay | Admitting: Pharmacist

## 2019-09-04 DIAGNOSIS — Z7901 Long term (current) use of anticoagulants: Secondary | ICD-10-CM | POA: Diagnosis not present

## 2019-09-04 DIAGNOSIS — Z952 Presence of prosthetic heart valve: Secondary | ICD-10-CM | POA: Diagnosis not present

## 2019-09-04 DIAGNOSIS — I2782 Chronic pulmonary embolism: Secondary | ICD-10-CM | POA: Diagnosis not present

## 2019-09-04 NOTE — Telephone Encounter (Signed)
Pt's daughter texted me PST FS POC INR 2.3 performed today. Was supposed to have done on Th 11-MAR-21 and receive dosing instructions. Failed to do so. Empirically gave her mom 5mg  each day except she omitted dose on Saturday. She was given instructions to provide 5mg  M/Tu/We and on Thursday 18-MAR-21 repeat PST FS POC INR.

## 2019-09-05 ENCOUNTER — Telehealth: Payer: Self-pay | Admitting: *Deleted

## 2019-09-05 NOTE — Telephone Encounter (Signed)
Received fax from [facility] with INR results from 06/30/2019 to 09/04/2019. Last INR 2.3 on 09/04/2019. Dr. Elie Confer is already aware. Results placed in Dr. Gladstone Pih office. Hubbard Hartshorn, BSN, RN-BC

## 2019-09-08 ENCOUNTER — Other Ambulatory Visit: Payer: Self-pay | Admitting: Internal Medicine

## 2019-09-08 DIAGNOSIS — G8929 Other chronic pain: Secondary | ICD-10-CM

## 2019-09-08 DIAGNOSIS — M5441 Lumbago with sciatica, right side: Secondary | ICD-10-CM

## 2019-09-14 ENCOUNTER — Ambulatory Visit (INDEPENDENT_AMBULATORY_CARE_PROVIDER_SITE_OTHER): Payer: Medicare Other | Admitting: Internal Medicine

## 2019-09-14 ENCOUNTER — Telehealth: Payer: Self-pay | Admitting: Pharmacist

## 2019-09-14 VITALS — BP 147/97 | HR 50 | Temp 98.8°F | Wt 174.3 lb

## 2019-09-14 DIAGNOSIS — M25559 Pain in unspecified hip: Secondary | ICD-10-CM | POA: Diagnosis not present

## 2019-09-14 DIAGNOSIS — M6740 Ganglion, unspecified site: Secondary | ICD-10-CM | POA: Diagnosis not present

## 2019-09-14 DIAGNOSIS — Z7901 Long term (current) use of anticoagulants: Secondary | ICD-10-CM

## 2019-09-14 DIAGNOSIS — M5441 Lumbago with sciatica, right side: Secondary | ICD-10-CM

## 2019-09-14 DIAGNOSIS — G8929 Other chronic pain: Secondary | ICD-10-CM | POA: Diagnosis not present

## 2019-09-14 DIAGNOSIS — I10 Essential (primary) hypertension: Secondary | ICD-10-CM

## 2019-09-14 LAB — POCT INR: INR: 5.4 — AB (ref 2.0–3.0)

## 2019-09-14 MED ORDER — WARFARIN SODIUM 5 MG PO TABS: ORAL_TABLET | ORAL | 1 refills | Status: DC

## 2019-09-14 MED ORDER — LISINOPRIL 10 MG PO TABS: 20.0000 mg | ORAL_TABLET | Freq: Every day | ORAL | 11 refills | Status: DC

## 2019-09-14 NOTE — Telephone Encounter (Signed)
Called results of INR from [facility] 5.4 (goal 2.5 - 3.5) on 35mg  warfarin/wk. OMIT today's dose; resume tomorrow with 1/2 tablet (2.5mg ) on Th/Sa/Mo; 1x5mg  on Fr/Su/Tu/We; Repeat INR on 5-APR-21 as PST FS POC INR with results to be texted to me. No bleeding reported by the patient. No new medications (denies antibiotics). Needs refill authorization (I will send refill to her designated pharmacy.)

## 2019-09-14 NOTE — Patient Instructions (Addendum)
Thank you, [name] for allowing [place] to provide your care today. Today we discussed Back and hip pain.    I have ordered none labs for you. I will call if any are abnormal.    I have place a referrals to [facility] Management Clinic for chronic back/hip pain.   I have ordered the following tests: please consider getting an MRI of your back and let me know what you decided.   I have ordered the following medication/changed the following medications:  1. Please restart taking the Cymbalta as it will take longer than 3 days to take effect.    2. Increase your lisinopril to 20 mg daily  Please follow-up in 1 months for a blood pressure check.   Should you have any questions or concerns please call the internal medicine clinic at [phone].    Kim Anthony, D.O. [facility]

## 2019-09-14 NOTE — Progress Notes (Signed)
CC: Back pain  HPI:  [name] is a 69 y.o. female with a past medical history stated below and presents today for back pain follow up. Please see problem based assessment and plan for additional details.    Past Medical History:  Diagnosis Date  . Arthritis   . Asthma   . Breast cancer ([place]) 2001   Left Breast Cancer  . CHF (congestive heart failure) ([place])   . COPD (chronic obstructive pulmonary disease) ([place])   . Coronary artery disease   . History of mitral valve replacement with mechanical valve   . HLD (hyperlipidemia)   . Hypertension   . Pulmonary embolism (HCC)     Family History  Problem Relation Age of Onset  . Heart attack Other   . Breast cancer Maternal Aunt 71     Social History   Socioeconomic History  . Marital status: Single    Spouse name: Not on file  . Number of children: Not on file  . Years of education: Not on file  . Highest education level: Not on file  Occupational History  . Not on file  Tobacco Use  . Smoking status: Current Every Day Smoker    Packs/day: 0.25    Years: 30.00    Pack years: 7.50    Types: Cigarettes  . Smokeless tobacco: Never Used  . Tobacco comment: Sometimes less.  Substance and Sexual Activity  . Alcohol use: Yes    Comment: beer- ocasional  . Drug use: No  . Sexual activity: Not Currently    Birth control/protection: None  Other Topics Concern  . Not on file  Social History Narrative   ** Merged History Encounter **       Social Determinants of Health   Financial Resource Strain:   . Difficulty of Paying Living Expenses:   Food Insecurity:   . Worried About Charity fundraiser in the Last Year:   . Arboriculturist in the Last Year:   Transportation Needs:   . Film/video editor (Medical):   [name] Lack of Transportation (Non-Medical):   Physical Activity:   . Days of Exercise per Week:   . Minutes of Exercise per Session:   Stress:   . Feeling of Stress :   Social Connections:   .  Frequency of Communication with Friends and Family:   . Frequency of Social Gatherings with Friends and Family:   . Attends Religious Services:   . Active Member of Clubs or Organizations:   . Attends Archivist Meetings:   [name] Marital Status:   Intimate Partner Violence:   . Fear of Current or Ex-Partner:   . Emotionally Abused:   [name] Physically Abused:   . Sexually Abused:      Review of Systems: ROS   Vitals:   09/14/19 1406  BP: (!) 157/103  Pulse: 87  Temp: 98.8 F (37.1 C)  TempSrc: Oral  SpO2: 97%  Weight: 174 lb 4.8 oz (79.1 kg)     Physical Exam: Physical Exam  Constitutional: She is well-developed, well-nourished, and in no distress.  HENT:  Head: Atraumatic.  Eyes: Pupils are equal, round, and reactive to light.  Cardiovascular: Normal rate and intact distal pulses.  No murmur heard. Pulmonary/Chest: Effort normal. No respiratory distress.  Abdominal: Soft. She exhibits no distension.  Musculoskeletal:        General: Tenderness (lumbar back and right hip) present.     Cervical back: Normal range of  motion.     Lumbar back: Pain present. No tenderness.     Right hip: Normal range of motion (2/2 to pain).     Assessment & Plan:   See Encounters Tab for problem based charting.  Patient discussed with Dr. Daryll Drown

## 2019-09-20 NOTE — Assessment & Plan Note (Addendum)
Patient continues to have elevated blood pressures despite medication management. Her blood pressure today was 157/103. I will increase her lisinopril to 20 mg.  Plan: - Increase lisinopril to 20 mg.

## 2019-09-21 ENCOUNTER — Other Ambulatory Visit: Payer: Self-pay | Admitting: *Deleted

## 2019-09-21 DIAGNOSIS — J441 Chronic obstructive pulmonary disease with (acute) exacerbation: Secondary | ICD-10-CM

## 2019-09-21 DIAGNOSIS — M67431 Ganglion, right wrist: Secondary | ICD-10-CM | POA: Insufficient documentation

## 2019-09-21 DIAGNOSIS — M6740 Ganglion, unspecified site: Secondary | ICD-10-CM | POA: Insufficient documentation

## 2019-09-21 NOTE — Assessment & Plan Note (Signed)
Patient continues to have low back and hip pain 2/2 to OA. I prescribed cymbalta at our last appointment, which she stopped taking after 3 days stating that it did not help her pain. I counseled her that she needs to take the medicine for longer than 3 days for it to work effectively. She was previously seen by a pain clinic. I offered her a referral back to a pain clinic for further evaluation and management of her chronic pain.  Plan: - Continue cymbalta - Referral to pain management clinic.

## 2019-09-21 NOTE — Assessment & Plan Note (Signed)
>>  ASSESSMENT AND PLAN FOR GANGLION CYST WRITTEN ON 09/21/2019 11:14 AM BY COE, BENJAMIN, MD  Patient present with pain and localized area of swelling on her right wrist that started a couple of days ago. She states that the area of swelling is painless, but she does have some back when moving her wrist. There is no surrounding erythema, warmth or rash. She does not have any systemic sign or symptoms concerning for infection. I counseled her regarding her likely diagnosis of ganglion cyst. I offered her a steroid infection, but she preferred to see if it would go away on its own.  Plan: - I told her to call the clinic to make an appointment if she would like me to inject the area with steroid. - Tylenol  650 mg q 6 hours for pain.

## 2019-09-21 NOTE — Assessment & Plan Note (Signed)
Patient present with pain and localized area of swelling on her right wrist that started a couple of days ago. She states that the area of swelling is painless, but she does have some back when moving her wrist. There is no surrounding erythema, warmth or rash. She does not have any systemic sign or symptoms concerning for infection. I counseled her regarding her likely diagnosis of ganglion cyst. I offered her a steroid infection, but she preferred to see if it would go away on its own.  Plan: - I told her to call the clinic to make an appointment if she would like me to inject the area with steroid. - Tylenol 650 mg q 6 hours for pain.

## 2019-09-22 MED ORDER — ANORO ELLIPTA 62.5-25 MCG/INH IN AEPB: INHALATION_SPRAY | RESPIRATORY_TRACT | 1 refills | Status: DC

## 2019-09-26 ENCOUNTER — Other Ambulatory Visit: Payer: Self-pay | Admitting: Internal Medicine

## 2019-09-26 NOTE — Telephone Encounter (Signed)
Pt medications have been refilled.  Called pt - informed to call [place]; stated she will.

## 2019-09-26 NOTE — Progress Notes (Signed)
Internal Medicine Clinic Attending  Case discussed with Dr. Coe at the time of the visit.  We reviewed the resident's history and exam and pertinent patient test results.  I agree with the assessment, diagnosis, and plan of care documented in the resident's note.    

## 2019-09-26 NOTE — Telephone Encounter (Signed)
Needs refill on warfarin (COUMADIN) 5 MG tablet umeclidinium-vilanterol (ANORO ELLIPTA) 62.5-25 MCG/INH AEPB   ;pt contact [place], [place]

## 2019-10-03 ENCOUNTER — Telehealth: Payer: Self-pay | Admitting: Pharmacist

## 2019-10-03 ENCOUNTER — Ambulatory Visit: Payer: Medicare Other

## 2019-10-03 NOTE — Telephone Encounter (Signed)
Daughter texted me results of PST FS POC INR = 3.0 (target goal 2.5 - 3.5) on 27.5mg  warfarin/wk. Was advised to CONTINUE same regimen. Repeat INR 19-APR-21. No bleeding reported.

## 2019-10-10 ENCOUNTER — Ambulatory Visit: Payer: Medicare Other | Attending: Family

## 2019-10-10 DIAGNOSIS — Z23 Encounter for immunization: Secondary | ICD-10-CM

## 2019-10-10 NOTE — Progress Notes (Signed)
   Covid-19 Vaccination Clinic  Name:  Kim Anthony    [name]: [id number] DOB: October 03, 1950  10/10/2019  Kim Anthony was observed post Covid-19 immunization for 15 minutes without incident. She was provided with Vaccine Information Sheet and instruction to access the V-Safe system.   Kim Anthony was instructed to call 911 with any severe reactions post vaccine: [name] Difficulty breathing  . Swelling of face and throat  . A fast heartbeat  . A bad rash all over body  . Dizziness and weakness   Immunizations Administered    Name Date Dose VIS Date Route   Moderna COVID-19 Vaccine 10/10/2019  3:48 PM 0.5 mL 05/2019 Intramuscular   Manufacturer: Moderna   Lot: [id number]   [id number]

## 2019-10-12 ENCOUNTER — Encounter: Payer: Self-pay | Admitting: Physical Medicine and Rehabilitation

## 2019-10-12 ENCOUNTER — Other Ambulatory Visit: Payer: Self-pay | Admitting: Internal Medicine

## 2019-10-12 ENCOUNTER — Ambulatory Visit: Payer: Medicare Other

## 2019-10-12 DIAGNOSIS — G8929 Other chronic pain: Secondary | ICD-10-CM

## 2019-10-16 ENCOUNTER — Ambulatory Visit (INDEPENDENT_AMBULATORY_CARE_PROVIDER_SITE_OTHER): Payer: Medicare Other | Admitting: Internal Medicine

## 2019-10-16 ENCOUNTER — Encounter: Payer: Self-pay | Admitting: Internal Medicine

## 2019-10-16 ENCOUNTER — Ambulatory Visit (INDEPENDENT_AMBULATORY_CARE_PROVIDER_SITE_OTHER): Payer: Medicare Other

## 2019-10-16 VITALS — BP 151/89 | HR 85 | Temp 98.4°F | Wt 175.2 lb

## 2019-10-16 DIAGNOSIS — I10 Essential (primary) hypertension: Secondary | ICD-10-CM

## 2019-10-16 DIAGNOSIS — I110 Hypertensive heart disease with heart failure: Secondary | ICD-10-CM

## 2019-10-16 DIAGNOSIS — J449 Chronic obstructive pulmonary disease, unspecified: Secondary | ICD-10-CM

## 2019-10-16 DIAGNOSIS — I5042 Chronic combined systolic (congestive) and diastolic (congestive) heart failure: Secondary | ICD-10-CM | POA: Diagnosis not present

## 2019-10-16 DIAGNOSIS — I2699 Other pulmonary embolism without acute cor pulmonale: Secondary | ICD-10-CM | POA: Diagnosis not present

## 2019-10-16 LAB — POCT INR
INR: 1.1 — AB (ref 2.0–3.0)
INR: 5 — AB (ref 2.0–3.0)

## 2019-10-16 MED ORDER — METOPROLOL SUCCINATE ER 25 MG PO TB24: 25.0000 mg | ORAL_TABLET | Freq: Every day | ORAL | 0 refills | Status: DC

## 2019-10-16 MED ORDER — LISINOPRIL 20 MG PO TABS: 20.0000 mg | ORAL_TABLET | Freq: Every day | ORAL | 0 refills | Status: DC

## 2019-10-16 MED ORDER — ANORO ELLIPTA 62.5-25 MCG/INH IN AEPB: INHALATION_SPRAY | RESPIRATORY_TRACT | 1 refills | Status: DC

## 2019-10-16 NOTE — Assessment & Plan Note (Addendum)
History precedes chart documentation.  I expect partially related to mitral valve disease.  EF has improved significantly on all available ECHO's.  Her volume status looks excellent today.  She was switched to 20mg  lisinopril last visit but only prescribed 15 days worth per month, she is only taking one of two 10mg  pills per day.  She did not take her medications today.  Based on meds review she picks these up inconsistently, their is questionable adherence vs poor understanding.    -ccm referral for adherence -change lisinopril to 20mg  tablets once daily -increase metoprolol to 25mg   -continue lasix 40mg  -one month follow up

## 2019-10-16 NOTE — Assessment & Plan Note (Signed)
>>  ASSESSMENT AND PLAN FOR CHF (CONGESTIVE HEART FAILURE) (HCC) WRITTEN ON 09/14/2023 11:43 AM BY Therin Vetsch, DO   >>ASSESSMENT AND PLAN FOR BIVENTRICULAR HEART FAILURE WITH REDUCED LEFT VENTRICULAR FUNCTION (HCC) WRITTEN ON 10/16/2019  9:44 AM BY Angelita Ingles, MD  History precedes chart documentation.  I expect partially related to mitral valve disease.  EF has improved significantly on all available ECHO's.  Her volume status looks excellent today.  She was switched to 20mg  lisinopril last visit but only prescribed 15 days worth per month, she is only taking one of two 10mg  pills per day.  She did not take her medications today.  Based on meds review she picks these up inconsistently, their is questionable adherence vs poor understanding.    -ccm referral for adherence -change lisinopril to 20mg  tablets once daily -increase metoprolol to 25mg   -continue lasix 40mg  -one month follow up

## 2019-10-16 NOTE — Assessment & Plan Note (Signed)
>>  ASSESSMENT AND PLAN FOR HFREF (HEART FAILURE WITH REDUCED EJECTION FRACTION) (HCC) WRITTEN ON 09/14/2023 11:44 AM BY Domenica Weightman, DO   >>ASSESSMENT AND PLAN FOR CHRONIC HFREF (HEART FAILURE WITH REDUCED EJECTION FRACTION) (HCC) WRITTEN ON 09/14/2023 11:43 AM BY Albertina Leise, DO   >>ASSESSMENT AND PLAN FOR CHF (CONGESTIVE HEART FAILURE) (HCC) WRITTEN ON 09/14/2023 11:43 AM BY Dennys Guin, DO   >>ASSESSMENT AND PLAN FOR BIVENTRICULAR HEART FAILURE WITH REDUCED LEFT VENTRICULAR FUNCTION (HCC) WRITTEN ON 10/16/2019  9:44 AM BY Angelita Ingles, MD  History precedes chart documentation.  I expect partially related to mitral valve disease.  EF has improved significantly on all available ECHO's.  Her volume status looks excellent today.  She was switched to 20mg  lisinopril last visit but only prescribed 15 days worth per month, she is only taking one of two 10mg  pills per day.  She did not take her medications today.  Based on meds review she picks these up inconsistently, their is questionable adherence vs poor understanding.    -ccm referral for adherence -change lisinopril to 20mg  tablets once daily -increase metoprolol to 25mg   -continue lasix 40mg  -one month follow up

## 2019-10-16 NOTE — Assessment & Plan Note (Signed)
>>  ASSESSMENT AND PLAN FOR BIVENTRICULAR HEART FAILURE WITH REDUCED LEFT VENTRICULAR FUNCTION (HCC) WRITTEN ON 10/16/2019  9:44 AM BY Angelita Ingles, MD  History precedes chart documentation.  I expect partially related to mitral valve disease.  EF has improved significantly on all available ECHO's.  Her volume status looks excellent today.  She was switched to 20mg  lisinopril last visit but only prescribed 15 days worth per month, she is only taking one of two 10mg  pills per day.  She did not take her medications today.  Based on meds review she picks these up inconsistently, their is questionable adherence vs poor understanding.    -ccm referral for adherence -change lisinopril to 20mg  tablets once daily -increase metoprolol to 25mg   -continue lasix 40mg  -one month follow up

## 2019-10-16 NOTE — Progress Notes (Signed)
Anticoagulation Management Kim Anthony is a 69 y.o. female who reports to the clinic for monitoring of warfarin treatment.    Indication: h/o PE and long term anticoagulation Duration: indefinite Supervising physician: [facility] Visit History: Patient does not report signs/symptoms of bleeding or thromboembolism  Other recent changes: Patient received azithromycin almost two weeks ago and completed a 5 day course. This could be one cause of her supratherapeutic INR today, but the more likely cause is that she has been following the instructions listed on her warfarin bottle rather than the instructions sent to her. Given this information, it is likely that her current regimen may still be appropriate.   Anticoagulation Episode Summary    Current INR goal:  2.5-3.5  TTR:  29.4 % (3.7 y)  Next INR check:  12/12/2018  INR from last check:  5.0 (10/16/2019)  Weekly max warfarin dose:    Target end date:    INR check location:  Anticoagulation Clinic  Preferred lab:    Send INR reminders to:     Indications   Pulmonary embolism (HCC) [I26.99]       Comments:          Allergies  Allergen Reactions  . Acetaminophen-Codeine Itching  . Propoxyphene Itching    Darvocet  . Tape Rash    Plastic  . Tramadol Itching and Nausea And Vomiting    Current Outpatient Medications:  .  albuterol (PROVENTIL) (2.5 MG/3ML) 0.083% nebulizer solution, Take 3 mLs (2.5 mg total) by nebulization every 6 (six) hours as needed for wheezing or shortness of breath., Disp: 75 mL, Rfl: 12 .  albuterol (VENTOLIN HFA) 108 (90 Base) MCG/ACT inhaler, Inhale 1-2 puffs into the lungs every 6 (six) hours as needed for wheezing or shortness of breath., Disp: 6.7 g, Rfl: 11 .  Calcium-Phosphorus-Vitamin D (CITRACAL +D3 PO), Take 1 tablet by mouth 2 (two) times a day., Disp: , Rfl:  .  fluticasone (FLONASE) 50 MCG/ACT nasal spray, Place 2 sprays into both nostrils daily. (Patient taking  differently: Place 1 spray into both nostrils daily as needed for allergies. ), Disp: 16 g, Rfl: 2 .  furosemide (LASIX) 40 MG tablet, Take 1 tablet (40 mg total) by mouth daily., Disp: 30 tablet, Rfl: 11 .  gabapentin (NEURONTIN) 600 MG tablet, TAKE 1 TABLET BY MOUTH THREE TIMES DAILY, Disp: 90 tablet, Rfl: 5 .  lisinopril (ZESTRIL) 20 MG tablet, Take 1 tablet (20 mg total) by mouth daily., Disp: 90 tablet, Rfl: 0 .  metoprolol succinate (TOPROL-XL) 25 MG 24 hr tablet, Take 1 tablet (25 mg total) by mouth daily. TAKE 1/2 (ONE-HALF) TABLET BY MOUTH ONCE DAILY, Disp: 90 tablet, Rfl: 0 .  oxyCODONE-acetaminophen (PERCOCET/ROXICET) 5-325 MG tablet, Take 1 tablet by mouth every 6 (six) hours as needed for severe pain., Disp: 5 tablet, Rfl: 0 .  polyethylene glycol (MIRALAX / GLYCOLAX) 17 g packet, Take 17 g by mouth daily as needed for mild constipation., Disp: 14 each, Rfl: 0 .  potassium chloride SA (KLOR-CON) 20 MEQ tablet, Take 2 tablets (40 mEq total) by mouth daily., Disp: 60 tablet, Rfl: 0 .  simvastatin (ZOCOR) 20 MG tablet, Take 1 tablet by mouth once daily, Disp: 90 tablet, Rfl: 1 .  umeclidinium-vilanterol (ANORO ELLIPTA) 62.5-25 MCG/INH AEPB, Inhale 1 puff by mouth once daily, Disp: 180 each, Rfl: 1 .  warfarin (COUMADIN) 5 MG tablet, TAKE 1/2 TABLET ON THURSDAYS, SATURDAYS AND MONDAYS. ALL OTHER DAYS, TAKE ONE (1) TABLET., Disp:  24 tablet, Rfl: 1 .  DULoxetine (CYMBALTA) 60 MG capsule, Take 1 capsule (60 mg total) by mouth daily., Disp: 30 capsule, Rfl: 0 .  enoxaparin (LOVENOX) 80 MG/0.8ML injection, Inject 0.75 mLs (75 mg total) into the skin every 12 (twelve) hours for 7 doses., Disp: 5.25 mL, Rfl: 0 Past Medical History:  Diagnosis Date  . Arthritis   . Asthma   . Breast cancer ([place]) 2001   Left Breast Cancer  . CHF (congestive heart failure) ([place])   . COPD (chronic obstructive pulmonary disease) ([place])   . Coronary artery disease   . History of mitral valve replacement with  mechanical valve   . HLD (hyperlipidemia)   . Hypertension   . Pulmonary embolism [facility])    Social History   Socioeconomic History  . Marital status: Single    Spouse name: Not on file  . Number of children: Not on file  . Years of education: Not on file  . Highest education level: Not on file  Occupational History  . Not on file  Tobacco Use  . Smoking status: Current Every Day Smoker    Packs/day: 0.25    Years: 30.00    Pack years: 7.50    Types: Cigarettes  . Smokeless tobacco: Never Used  . Tobacco comment: Sometimes less.  Substance and Sexual Activity  . Alcohol use: Yes    Comment: beer- ocasional  . Drug use: No  . Sexual activity: Not Currently    Birth control/protection: None  Other Topics Concern  . Not on file  Social History Narrative   ** Merged History Encounter **       Social Determinants of Health   Financial Resource Strain:   . Difficulty of Paying Living Expenses:   Food Insecurity:   . Worried About Charity fundraiser in the Last Year:   . Arboriculturist in the Last Year:   Transportation Needs:   . Film/video editor (Medical):   [name] Lack of Transportation (Non-Medical):   Physical Activity:   . Days of Exercise per Week:   . Minutes of Exercise per Session:   Stress:   . Feeling of Stress :   Social Connections:   . Frequency of Communication with Friends and Family:   . Frequency of Social Gatherings with Friends and Family:   . Attends Religious Services:   . Active Member of Clubs or Organizations:   . Attends Archivist Meetings:   [name] Marital Status:    Family History  Problem Relation Age of Onset  . Heart attack Other   . Breast cancer Maternal Aunt 50    ASSESSMENT Recent Results: The most recent result is correlated with 27.5 mg per week: Lab Results  Component Value Date   INR 1.1 (A) 10/16/2019   INR 5.0 (A) 10/16/2019   INR 5.4 (A) 09/14/2019    Anticoagulation Dosing: Description   Hold  warfarin dose today and tomorrow. Resume warfarin at with current regimen on 4/28. Take Warfarin 5mg  by mouth every day except take 7.5mg  on Sundays.      INR today: Supratherapeutic  PLAN Weekly dose was unchanged. Instead, after discussing with Dr. Elie Confer, we made the decision to hold her warfarin dose for two days, continue on her same regimen, and recheck her INR in one week. At that point we will decide if an overall dose adjustment should be made.   We advised her to seek medication attention if she sees  any signs of bleeding or unusual bruising, or if she has a fall/accident. The patient is aware of her high risk of bleeding with her current INR. All questions and concerns were addressed at this time.   Patient Instructions  Patient instructed to take medications as defined in the Anti-coagulation Track section of this encounter.  Patient instructed to hold today's and tomorrow's dose.  Patient instructed to take Warfarin 5mg  by mouth every day except take 7.5mg  on Sundays.  Patient verbalized understanding of these instructions.    Patient advised to contact clinic or seek medical attention if signs/symptoms of bleeding or thromboembolism occur.  Patient verbalized understanding by repeating back information and was advised to contact me if further medication-related questions arise. Patient was also provided an information handout.  Follow-up This patient checks her INR at home. Dr. Elie Confer will follow up with the patient and her home nurse to determine next steps in her warfarin therapy.   15 minutes spent face-to-face with the patient during the encounter. 50% of time spent on education, including signs/sx bleeding and clotting, as well as food and drug interactions with warfarin. 50% of time was spent on fingerprick POC INR sample collection,processing, results determination, and documentation in [email or web address]

## 2019-10-16 NOTE — Progress Notes (Signed)
CC: HTN follow up, Systolic and Diastolic CHF  HPI:  [name] is a 69 y.o. female with PMH below.  Today we will address HTN follow up, Systolic and Diastolic CHF  Please see A&P for status of the patient's chronic medical conditions  Past Medical History:  Diagnosis Date  . Arthritis   . Asthma   . Breast cancer ([place]) 2001   Left Breast Cancer  . CHF (congestive heart failure) ([place])   . COPD (chronic obstructive pulmonary disease) ([place])   . Coronary artery disease   . History of mitral valve replacement with mechanical valve   . HLD (hyperlipidemia)   . Hypertension   . Pulmonary embolism ([place])    Review of Systems:  ROS: Pulmonary: pt denies increased work of breathing, shortness of breath,  Cardiac: pt denies palpitations, chest pain,  Abdominal: pt denies abdominal pain, nausea, vomiting, or diarrhea   Physical Exam:  Vitals:   10/16/19 0859  BP: (!) 151/89  Pulse: 85  Temp: 98.4 F (36.9 C)  TempSrc: Oral  SpO2: 99%  Weight: 175 lb 3.2 oz (79.5 kg)   Cardiac: JVD flat, normal rate and rhythm, clear s1 and s2, mechanical click heard, no murmurs, rubs or gallops, no LE edema Pulmonary: CTAB, not in distress Abdominal: non distended abdomen, soft and nontender Psych: Alert, conversant, in good spirits   Social History   Socioeconomic History  . Marital status: Single    Spouse name: Not on file  . Number of children: Not on file  . Years of education: Not on file  . Highest education level: Not on file  Occupational History  . Not on file  Tobacco Use  . Smoking status: Current Every Day Smoker    Packs/day: 0.25    Years: 30.00    Pack years: 7.50    Types: Cigarettes  . Smokeless tobacco: Never Used  . Tobacco comment: Sometimes less.  Substance and Sexual Activity  . Alcohol use: Yes    Comment: beer- ocasional  . Drug use: No  . Sexual activity: Not Currently    Birth control/protection: None  Other Topics Concern  . Not on file    Social History Narrative   ** Merged History Encounter **       Social Determinants of Health   Financial Resource Strain:   . Difficulty of Paying Living Expenses:   Food Insecurity:   . Worried About Charity fundraiser in the Last Year:   . Arboriculturist in the Last Year:   Transportation Needs:   . Film/video editor (Medical):   [name] Lack of Transportation (Non-Medical):   Physical Activity:   . Days of Exercise per Week:   . Minutes of Exercise per Session:   Stress:   . Feeling of Stress :   Social Connections:   . Frequency of Communication with Friends and Family:   . Frequency of Social Gatherings with Friends and Family:   . Attends Religious Services:   . Active Member of Clubs or Organizations:   . Attends Archivist Meetings:   [name] Marital Status:   Intimate Partner Violence:   . Fear of Current or Ex-Partner:   . Emotionally Abused:   [name] Physically Abused:   . Sexually Abused:     Family History  Problem Relation Age of Onset  . Heart attack Other   . Breast cancer Maternal Aunt 50    Assessment & Plan:   See  Encounters Tab for problem based charting.  Patient discussed with Dr. Lynnae January

## 2019-10-16 NOTE — Assessment & Plan Note (Signed)
>>  ASSESSMENT AND PLAN FOR CHRONIC HFREF (HEART FAILURE WITH REDUCED EJECTION FRACTION) (HCC) WRITTEN ON 09/14/2023 11:43 AM BY Mazen Marcin, DO   >>ASSESSMENT AND PLAN FOR CHF (CONGESTIVE HEART FAILURE) (HCC) WRITTEN ON 09/14/2023 11:43 AM BY Fredonia Casalino, DO   >>ASSESSMENT AND PLAN FOR BIVENTRICULAR HEART FAILURE WITH REDUCED LEFT VENTRICULAR FUNCTION (HCC) WRITTEN ON 10/16/2019  9:44 AM BY Angelita Ingles, MD  History precedes chart documentation.  I expect partially related to mitral valve disease.  EF has improved significantly on all available ECHO's.  Her volume status looks excellent today.  She was switched to 20mg  lisinopril last visit but only prescribed 15 days worth per month, she is only taking one of two 10mg  pills per day.  She did not take her medications today.  Based on meds review she picks these up inconsistently, their is questionable adherence vs poor understanding.    -ccm referral for adherence -change lisinopril to 20mg  tablets once daily -increase metoprolol to 25mg   -continue lasix 40mg  -one month follow up

## 2019-10-16 NOTE — Progress Notes (Signed)
[facility] Attending  Case discussed with Dr. Winfrey  at the time of the visit.  We reviewed the resident's history and exam and pertinent patient test results.  I agree with the assessment, diagnosis, and plan of care documented in the resident's note.  

## 2019-10-16 NOTE — Assessment & Plan Note (Signed)
Pt requires refills on medications with associated diagnosis above.  Reviewed disease process and find this medication to be necessary, will not change dose or alter current therapy. 

## 2019-10-16 NOTE — Patient Instructions (Addendum)
Patient instructed to take medications as defined in the Anti-coagulation Track section of this encounter.  Patient instructed to hold today's and tomorrow's dose.  Patient instructed to take Warfarin 5mg  by mouth every day except take 7.5mg  on Sundays.  Patient verbalized understanding of these instructions.

## 2019-10-16 NOTE — Patient Instructions (Signed)
Kim Anthony, please follow up in one month to recheck your blood pressure and heart rate.  Please keep a log of your blood pressure at home and bring your blood pressure machine with you for your next visit.

## 2019-10-16 NOTE — Assessment & Plan Note (Signed)
She was increased to 20mg  lisinopril last visit but only prescribed 15 days worth per month, she is only taking one of two 10mg  pills per day.  She did not take her medications today.  Based on meds review she picks these up inconsistently, their is questionable adherence vs poor understanding.    -ccm referral for adherence -change lisinopril to 20mg  tablets once daily -increase metoprolol to 25mg   -continue lasix 40mg  -one month follow up

## 2019-10-17 ENCOUNTER — Emergency Department (HOSPITAL_COMMUNITY)
Admission: EM | Admit: 2019-10-17 | Discharge: 2019-10-17 | Disposition: A | Discharge status: Home / Self Care | Payer: Medicare Other | Attending: Emergency Medicine | Admitting: Emergency Medicine

## 2019-10-17 ENCOUNTER — Other Ambulatory Visit: Payer: Self-pay

## 2019-10-17 ENCOUNTER — Emergency Department (HOSPITAL_COMMUNITY): Payer: Medicare Other

## 2019-10-17 ENCOUNTER — Telehealth: Payer: Self-pay | Admitting: *Deleted

## 2019-10-17 ENCOUNTER — Encounter (HOSPITAL_COMMUNITY): Payer: Self-pay

## 2019-10-17 DIAGNOSIS — J449 Chronic obstructive pulmonary disease, unspecified: Secondary | ICD-10-CM | POA: Diagnosis not present

## 2019-10-17 DIAGNOSIS — Z952 Presence of prosthetic heart valve: Secondary | ICD-10-CM | POA: Insufficient documentation

## 2019-10-17 DIAGNOSIS — Z7901 Long term (current) use of anticoagulants: Secondary | ICD-10-CM | POA: Insufficient documentation

## 2019-10-17 DIAGNOSIS — Z853 Personal history of malignant neoplasm of breast: Secondary | ICD-10-CM | POA: Insufficient documentation

## 2019-10-17 DIAGNOSIS — M069 Rheumatoid arthritis, unspecified: Secondary | ICD-10-CM | POA: Insufficient documentation

## 2019-10-17 DIAGNOSIS — I110 Hypertensive heart disease with heart failure: Secondary | ICD-10-CM | POA: Insufficient documentation

## 2019-10-17 DIAGNOSIS — M6740 Ganglion, unspecified site: Secondary | ICD-10-CM

## 2019-10-17 DIAGNOSIS — I5042 Chronic combined systolic (congestive) and diastolic (congestive) heart failure: Secondary | ICD-10-CM | POA: Diagnosis not present

## 2019-10-17 DIAGNOSIS — F17210 Nicotine dependence, cigarettes, uncomplicated: Secondary | ICD-10-CM | POA: Diagnosis not present

## 2019-10-17 DIAGNOSIS — Z79899 Other long term (current) drug therapy: Secondary | ICD-10-CM | POA: Diagnosis not present

## 2019-10-17 DIAGNOSIS — M25531 Pain in right wrist: Secondary | ICD-10-CM | POA: Diagnosis not present

## 2019-10-17 DIAGNOSIS — Z9012 Acquired absence of left breast and nipple: Secondary | ICD-10-CM | POA: Insufficient documentation

## 2019-10-17 DIAGNOSIS — M1990 Unspecified osteoarthritis, unspecified site: Secondary | ICD-10-CM

## 2019-10-17 DIAGNOSIS — M19031 Primary osteoarthritis, right wrist: Secondary | ICD-10-CM | POA: Diagnosis not present

## 2019-10-17 DIAGNOSIS — M545 Low back pain: Secondary | ICD-10-CM | POA: Diagnosis not present

## 2019-10-17 DIAGNOSIS — M67431 Ganglion, right wrist: Secondary | ICD-10-CM | POA: Insufficient documentation

## 2019-10-17 DIAGNOSIS — M064 Inflammatory polyarthropathy: Secondary | ICD-10-CM | POA: Diagnosis present

## 2019-10-17 MED ORDER — HYDROCODONE-ACETAMINOPHEN 5-325 MG PO TABS: 1.0000 | ORAL_TABLET | Freq: Four times a day (QID) | ORAL | 0 refills | Status: DC | PRN

## 2019-10-17 MED ORDER — HYDROCODONE-ACETAMINOPHEN 5-325 MG PO TABS
1.0000 | ORAL_TABLET | Freq: Once | ORAL | Status: AC
  Administered 2019-10-17: 1 via ORAL
  Filled 2019-10-17: qty 1

## 2019-10-17 NOTE — ED Provider Notes (Signed)
[facility] EMERGENCY DEPARTMENT Provider Note   CSN: [id number] Arrival date & time: 10/17/19  1004     History No chief complaint on file.   Kim Anthony is a 69 y.o. female with past medical history significant for CAD, COPD, pulmonary embolism, CHF, HTN, asthma, and arthritis who presents to the ED with a 4-day history of polyarthropathy.  She is endorsing swelling of her wrist, but she is also complaining of her chronic right-sided hip and low back discomfort with radicular symptoms.  Patient reports that she has been evaluated extensively in the past for her low back discomfort.  She states that she had similar episode of right wrist swelling that occurred approximately 1 month ago that resolved spontaneously.  I reviewed patient's past medical record and she was evaluated by her primary care provider, Dr. Marianna Payment, on 09/14/2019 for these same symptoms.  It was suspected that she had a ganglion cyst that was causing her symptoms of right wrist swelling and was offered a steroid injection, but she declined.  Plan was for her to call the clinic should she change her mind.  Patient is already being followed for her chronic pain at an outpatient pain management center.  She has taken oxycodone in the past at home for her low back pain symptoms.  She denies any fevers or chills, abdominal pain, numbness or weakness, incontinence, saddle anesthesia, history of IVDA, headache or dizziness, chest pain or difficulty breathing, extremity swelling, or other symptoms.  HPI     Past Medical History:  Diagnosis Date  . Arthritis   . Asthma   . Breast cancer ([place]) 2001   Left Breast Cancer  . CHF (congestive heart failure) ([place])   . COPD (chronic obstructive pulmonary disease) ([place])   . Coronary artery disease   . History of mitral valve replacement with mechanical valve   . HLD (hyperlipidemia)   . Hypertension   . Pulmonary embolism [facility])     Patient Active Problem List   Diagnosis Date Noted  . Ganglion cyst 09/21/2019  . Hip pain 08/23/2019  . Thoracic ascending aortic aneurysm ([place]) 07/05/2019  . Bilateral pneumonia 06/26/2019  . Pustular rash 03/09/2019  . Anemia 02/16/2019  . Breast cancer ([place])   . Acquired absence of left breast 12/20/2018  . Open wound of axillary region 05/11/2018  . COPD (chronic obstructive pulmonary disease) ([place]) 11/25/2017  . S/P MVR (mitral valve replacement)   . Chronic anticoagulation   . Tobacco use disorder 02/25/2017  . Lymphadenopathy, mediastinal 02/25/2017  . History of mitral valve replacement with mechanical valve 11/18/2016  . Essential hypertension 02/01/2016  . Allergic rhinitis 02/01/2016  . Healthcare maintenance 01/30/2016  . Chronic low back pain 07/22/2015  . Pulmonary embolism ([employer]) 07/11/2015  . Chronic combined systolic and diastolic congestive heart failure ([place]) 07/11/2015  . Headache, common migraine 07/11/2015  . HLD (hyperlipidemia) 07/11/2015    Past Surgical History:  Procedure Laterality Date  . APPLICATION OF A-CELL OF CHEST/ABDOMEN Left 01/27/2019   Procedure: APPLICATION OF A-CELL OF CHEST;  Surgeon: Wallace Going, DO;  Location: [facility];  Service: Plastics;  Laterality: Left;  . APPLICATION OF WOUND VAC N/A 01/27/2019   Procedure: APPLICATION OF WOUND VAC;  Surgeon: Wallace Going, DO;  Location: [facility];  Service: Plastics;  Laterality: N/A;  . COLONOSCOPY WITH PROPOFOL N/A 06/07/2018   Procedure: COLONOSCOPY WITH PROPOFOL;  Surgeon: Wilford Corner, MD;  Location: [facility];  Service: Endoscopy;  Laterality: N/A;  .  DEBRIDEMENT AND CLOSURE WOUND Left 12/28/2018   Procedure: Debridement And Closure Wound;  Surgeon: Coralie Keens, MD;  Location: [facility];  Service: General;  Laterality: Left;  . INCISION AND DRAINAGE OF WOUND Left 01/27/2019   Procedure: IRRIGATION AND DEBRIDEMENT OF CHEST WALL;  Surgeon: Wallace Going, DO;  Location: [place];  Service: Plastics;   Laterality: Left;  . LATISSIMUS FLAP TO BREAST Left 01/18/2019   Procedure: LEFT LATISSIMUS FLAP TO BREAST;  Surgeon: Wallace Going, DO;  Location: [facility];  Service: Plastics;  Laterality: Left;  [name] MASTECTOMY Left 2000s   for breast cancer, also s/p radiation and chemo  . MITRAL VALVE REPLACEMENT     mechanical  . POLYPECTOMY  06/07/2018   Procedure: POLYPECTOMY;  Surgeon: Wilford Corner, MD;  Location: [facility];  Service: Endoscopy;;  . WOUND DEBRIDEMENT Left 12/28/2018   Procedure: EXCISION OF LEFT CHRONIC CHEST WALL WOUND;  Surgeon: Coralie Keens, MD;  Location: [place];  Service: General;  Laterality: Left;     OB History    Gravida  2   Para  2   Term  2   Preterm      AB      Living        SAB      TAB      Ectopic      Multiple      Live Births  2           Family History  Problem Relation Age of Onset  . Heart attack Other   . Breast cancer Maternal Aunt 53    Social History   Tobacco Use  . Smoking status: Current Every Day Smoker    Packs/day: 0.25    Years: 30.00    Pack years: 7.50    Types: Cigarettes  . Smokeless tobacco: Never Used  . Tobacco comment: Sometimes less.  Substance Use Topics  . Alcohol use: Yes    Comment: beer- ocasional  . Drug use: No    Home Medications Prior to Admission medications   Medication Sig Start Date End Date Taking? Authorizing Provider  albuterol (PROVENTIL) (2.5 MG/3ML) 0.083% nebulizer solution Take 3 mLs (2.5 mg total) by nebulization every 6 (six) hours as needed for wheezing or shortness of breath. 06/26/19   Seawell, Jaimie A, DO  albuterol (VENTOLIN HFA) 108 (90 Base) MCG/ACT inhaler Inhale 1-2 puffs into the lungs every 6 (six) hours as needed for wheezing or shortness of breath. 06/26/19   Seawell, Jaimie A, DO  Calcium-Phosphorus-Vitamin D (CITRACAL +D3 PO) Take 1 tablet by mouth 2 (two) times a day.    [provider]  DULoxetine (CYMBALTA) 60 MG capsule Take 1 capsule (60  mg total) by mouth daily. 08/16/19 09/15/19  Marianna Payment, MD  enoxaparin (LOVENOX) 80 MG/0.8ML injection Inject 0.75 mLs (75 mg total) into the skin every 12 (twelve) hours for 7 doses. 07/06/19 07/10/19  Al Decant, MD  fluticasone (FLONASE) 50 MCG/ACT nasal spray Place 2 sprays into both nostrils daily. Patient taking differently: Place 1 spray into both nostrils daily as needed for allergies.  12/03/16 04/11/26  Jule Ser, DO  furosemide (LASIX) 40 MG tablet Take 1 tablet (40 mg total) by mouth daily. 02/16/19 02/16/20  Marianna Payment, MD  gabapentin (NEURONTIN) 600 MG tablet TAKE 1 TABLET BY MOUTH THREE TIMES DAILY 10/14/19   Marianna Payment, MD  lisinopril (ZESTRIL) 20 MG tablet Take 1 tablet (20 mg total) by mouth daily. 10/16/19 01/14/20  Katherine Roan, MD  metoprolol succinate (TOPROL-XL) 25 MG 24 hr tablet Take 1 tablet (25 mg total) by mouth daily. TAKE 1/2 (ONE-HALF) TABLET BY MOUTH ONCE DAILY 10/16/19   Katherine Roan, MD  oxyCODONE-acetaminophen (PERCOCET/ROXICET) 5-325 MG tablet Take 1 tablet by mouth every 6 (six) hours as needed for severe pain. 08/09/19   Antonietta Breach, PA-C  polyethylene glycol (MIRALAX / GLYCOLAX) 17 g packet Take 17 g by mouth daily as needed for mild constipation. 02/01/19   Monica Becton, MD  potassium chloride SA (KLOR-CON) 20 MEQ tablet Take 2 tablets (40 mEq total) by mouth daily. 08/09/19   Antonietta Breach, PA-C  simvastatin (ZOCOR) 20 MG tablet Take 1 tablet by mouth once daily 06/07/19   Marianna Payment, MD  umeclidinium-vilanterol [facility] ELLIPTA) 62.5-25 MCG/INH AEPB Inhale 1 puff by mouth once daily 10/16/19   Katherine Roan, MD  warfarin (COUMADIN) 5 MG tablet TAKE 1/2 TABLET ON THURSDAYS, SATURDAYS AND MONDAYS. ALL OTHER DAYS, TAKE ONE (1) TABLET. 09/14/19   Pennie Banter, RPH-CPP    Allergies    Acetaminophen-codeine, Propoxyphene, Tape, and Tramadol  Review of Systems   Review of Systems  All other systems reviewed and are  negative.   Physical Exam Updated Vital Signs BP 134/90 (BP Location: Right Arm)   Pulse 74   Temp 98.2 F (36.8 C)   Resp 16   Ht 5\' 5"  (1.651 m)   Wt 77.1 kg   SpO2 96%   BMI 28.29 kg/m   Physical Exam Vitals and nursing note reviewed. Exam conducted with a chaperone present.  Constitutional:      Appearance: Normal appearance.  HENT:     Head: Normocephalic and atraumatic.  Eyes:     General: No scleral icterus.    Conjunctiva/sclera: Conjunctivae normal.  Cardiovascular:     Rate and Rhythm: Normal rate and regular rhythm.     Pulses: Normal pulses.     Heart sounds: Normal heart sounds.  Pulmonary:     Effort: Pulmonary effort is normal. No respiratory distress.     Breath sounds: Normal breath sounds.  Musculoskeletal:     Cervical back: Normal range of motion. No rigidity.     Comments: Right wrist: Moderate swelling overlying distal radius.  No significant erythema or warmth appreciated.  Able to demonstrate ROM.  Sensation intact throughout.  Radial pulse and cap refill intact. Low back: Mild midline lumbar TTP.  Tenderness is more significant over right side, over SI joint.  Positive right-sided SLR with radiation into thigh.  Negative left-sided SLR.  Skin:    General: Skin is dry.     Capillary Refill: Capillary refill takes less than 2 seconds.  Neurological:     Mental Status: She is alert and oriented to person, place, and time.     GCS: GCS eye subscore is 4. GCS verbal subscore is 5. GCS motor subscore is 6.     Comments: Able to ambulate.  Psychiatric:        Mood and Affect: Mood normal.        Behavior: Behavior normal.        Thought Content: Thought content normal.     ED Results / Procedures / Treatments   Labs (all labs ordered are listed, but only abnormal results are displayed) Labs Reviewed - No data to display  EKG None  Radiology DG Lumbar Spine 2-3 Views  Result Date: 10/17/2019 CLINICAL DATA:  RIGHT-side lower back pain and  sciatica  for 1 day, positive RIGHT-side straight leg raise, no injury EXAM: LUMBAR SPINE - 2-3 VIEW COMPARISON:  06/22/2019 FINDINGS: Five non-rib-bearing lumbar vertebra. Mild osseous demineralization. Vertebral body and disc space heights maintained. No fracture, subluxation, or bone destruction. SI joints preserved. Atherosclerotic calcifications aorta. IMPRESSION: No significant lumbar spine abnormalities. Electronically Signed   By: Lavonia Dana M.D.   On: 10/17/2019 13:51   DG Wrist Complete Right  Result Date: 10/17/2019 CLINICAL DATA:  Lateral RIGHT wrist pain and swelling for 1 day, no injury EXAM: RIGHT WRIST - COMPLETE 3+ VIEW COMPARISON:  None FINDINGS: Osseous demineralization. Joint spaces preserved. Mild dorsal soft tissue swelling. No acute fracture, dislocation, or bone destruction. IMPRESSION: No acute osseous abnormalities. Electronically Signed   By: Lavonia Dana M.D.   On: 10/17/2019 13:52    Procedures Procedures (including critical care time)  Medications Ordered in ED Medications - No data to display  ED Course  I have reviewed the triage vital signs and the nursing notes.  Pertinent labs & imaging results that were available during my care of the patient were reviewed by me and considered in my medical decision making (see chart for details).    MDM Rules/Calculators/A&P                      Patient is afebrile and her vital signs are all within normal notes.  She is not ill-appearing.  Her swelling over distal wrist is likely consistent with a ganglion cyst, for which she was recently evaluated by her primary care provider.  He had offered her a steroid injection, but she had declined as it has come and gone spontaneously in the past.  Recommending that she follow-up with him regarding ongoing evaluation management.  Her right-sided lumbar region discomfort with right side radiculopathy is chronic.  Today's physical exam is consistent with diagnosis.  Obtained plain  films to help r/o malignancy or bony destruction concerning for osteomyelitis.  No red flags concerning patient's back pain. No s/s of central cord compression or cauda equina. Lower extremities are neurovascularly intact and patient is ambulating without difficulty.  She is already managed by pain management and is not requesting any further analgesics.  She understands that she will need to continue being managed by them.  I offered referral to orthopedics, which he declined.  Encouraged her to follow-up with her primary care provider regarding today's encounter as she may ultimately benefit from orthopedic or neurosurgical evaluation.  EDIT: Patient was referred to pain management however will not be evaluated by them for another week.  She does not have any pain medications at home.  She does not have any oxycodone at home and has been trying Tylenol and ibuprofen, with little relief.  We will treat with Vicodin here in the ED as she is not driving.  We will also prescribe a short course to help her manage her symptoms until she can be evaluated.  I reviewed PDMP and she has not recently filled a prescription for narcotic medication, most recently #5 filled on 08/09/2019.  Strict ED return precautions discussed.  All of the evaluation and work-up results were discussed with the patient and any family at bedside. They were provided opportunity to ask any additional questions and have none at this time. They have expressed understanding of verbal discharge instructions as well as return precautions and are agreeable to the plan.    Final Clinical Impression(s) / ED Diagnoses Final diagnoses:  Arthritis  Ganglion  cyst    Rx / DC Orders ED Discharge Orders    None       Reita Chard 10/17/19 1437    Dorie Rank, MD 10/18/19 [phone]

## 2019-10-17 NOTE — Telephone Encounter (Addendum)
Information to CoverMyMeds.  Pt's insurance does not cover the Anoro but does cover Stiolto Respimet or B[employer]eros[employer]FA [employer].  Message to be sent to Dr. Shan Levans to consider a change.  Sander Nephew, RN 10/17/2019 2:32 PM.

## 2019-10-17 NOTE — Discharge Instructions (Signed)
Please read the attachments.  Continue with your pain management, at home.  I have also prescribed you a short course of Vicodin that you can take for breakthrough pain.  You will need to have your chronic pain symptoms managed by a pain management facility.  Please go to your appointment with them, as scheduled.  You would benefit from calling your primary care provider regarding today's encounter.  Dr. Marianna Payment offered to provide steroid injection for your ganglion cyst.  I encourage you to follow-up with them should you continue experience symptoms of discomfort.  Return to the ED or seek immediate medical attention should you experience any new or worsening symptoms.

## 2019-10-17 NOTE — ED Triage Notes (Signed)
Patient complains of right wrist, hip and back pain for several days, swelling noticed to wrist, denies injury. Does report arthritis and complains of ongoing sciatica discomfort

## 2019-10-17 NOTE — Telephone Encounter (Incomplete Revision)
Information to CoverMyMeds.  Pt's insurance does not cover the Anoro but does cover Stiolto Respimet or [employer]FA [employer].  Message to be sent to Dr. Shan Levans to consider a change.  Sander Nephew, RN 10/17/2019 2:32 PM.

## 2019-10-18 ENCOUNTER — Ambulatory Visit (INDEPENDENT_AMBULATORY_CARE_PROVIDER_SITE_OTHER): Payer: Medicare Other | Admitting: Internal Medicine

## 2019-10-18 VITALS — BP 126/80 | HR 71 | Temp 98.1°F | Wt 180.1 lb

## 2019-10-18 DIAGNOSIS — M13131 Monoarthritis, not elsewhere classified, right wrist: Secondary | ICD-10-CM

## 2019-10-18 DIAGNOSIS — M13139 Monoarthritis, not elsewhere classified, unspecified wrist: Secondary | ICD-10-CM | POA: Insufficient documentation

## 2019-10-18 HISTORY — DX: Monoarthritis, not elsewhere classified, unspecified wrist: M13.139

## 2019-10-18 MED ORDER — HYDROCODONE-ACETAMINOPHEN 5-325 MG PO TABS: 1.0000 | ORAL_TABLET | Freq: Four times a day (QID) | ORAL | 0 refills | Status: DC | PRN

## 2019-10-18 NOTE — Patient Instructions (Addendum)
Kim Anthony, Today we discussed your wrist pain. Since this is the first time this has happened, I'd like to get you in to a specialist that can drain the fluid off for further evaluation before we treat it. They will see you first thing tomorrow morning.   This way we will also know in the future what it most likely is if this recurs.   Take care! Dr. Koleen Distance

## 2019-10-19 ENCOUNTER — Other Ambulatory Visit: Payer: Self-pay | Admitting: *Deleted

## 2019-10-19 DIAGNOSIS — I5042 Chronic combined systolic (congestive) and diastolic (congestive) heart failure: Secondary | ICD-10-CM

## 2019-10-19 DIAGNOSIS — M25531 Pain in right wrist: Secondary | ICD-10-CM | POA: Diagnosis not present

## 2019-10-19 NOTE — Telephone Encounter (Signed)
Fax from [facility] to clarify metoprolol 's directions. Rx written on 4/26 - take 1 tab daily OR take 1/2 tab daily.  Please re-send. Thanks

## 2019-10-20 ENCOUNTER — Other Ambulatory Visit: Payer: Self-pay | Admitting: Internal Medicine

## 2019-10-20 MED ORDER — METOPROLOL SUCCINATE ER 25 MG PO TB24: 25.0000 mg | ORAL_TABLET | Freq: Every day | ORAL | 0 refills | Status: DC

## 2019-10-20 NOTE — Telephone Encounter (Signed)
Not sure why the free text remained when I switched to specify dose, route frequency.  I have corrected this.

## 2019-10-27 ENCOUNTER — Encounter: Payer: Self-pay | Admitting: Internal Medicine

## 2019-10-27 NOTE — Progress Notes (Signed)
Acute Office Visit  Subjective:    Patient ID: Kim Anthony, female    DOB: 10-25-1950, 69 y.o.   [name]: [id number]  Chief Complaint  Patient presents with  . hand swelling    c/o pain and swelling right hand/wrist-started 2 days ago(seen in ED yesterday)(received 2nd Moderna vaccine on 10/10/19)    HPI Patient is in today for acute pain and swelling of right wrist that began 2 days ago. Please see problem based charting for further details.   Past Medical History:  Diagnosis Date  . Arthritis   . Asthma   . Breast cancer (HCC) 2001   Left Breast Cancer  . CHF (congestive heart failure) (HCC)   . COPD (chronic obstructive pulmonary disease) (HCC)   . Coronary artery disease   . History of mitral valve replacement with mechanical valve   . HLD (hyperlipidemia)   . Hypertension   . Pulmonary embolism [facility])     Past Surgical History:  Procedure Laterality Date  . APPLICATION OF A-CELL OF CHEST/ABDOMEN Left 01/27/2019   Procedure: APPLICATION OF A-CELL OF CHEST;  Surgeon: Peggye Form, DO;  Location: [facility] OR;  Service: Plastics;  Laterality: Left;  . APPLICATION OF WOUND VAC N/A 01/27/2019   Procedure: APPLICATION OF WOUND VAC;  Surgeon: Peggye Form, DO;  Location: [facility] OR;  Service: Plastics;  Laterality: N/A;  . COLONOSCOPY WITH PROPOFOL N/A 06/07/2018   Procedure: COLONOSCOPY WITH PROPOFOL;  Surgeon: Charlott Rakes, MD;  [facility];  Service: Endoscopy;  Laterality: N/A;  . DEBRIDEMENT AND CLOSURE WOUND Left 12/28/2018   Procedure: Debridement And Closure Wound;  Surgeon: Abigail Miyamoto, MD;  [facility] OR;  Service: General;  Laterality: Left;  . INCISION AND DRAINAGE OF WOUND Left 01/27/2019   Procedure: IRRIGATION AND DEBRIDEMENT OF CHEST WALL;  Surgeon: Peggye Form, DO;  [facility] OR;  Service: Plastics;  Laterality: Left;  . LATISSIMUS FLAP TO BREAST Left 01/18/2019   Procedure: LEFT LATISSIMUS FLAP TO BREAST;  Surgeon: Peggye Form, DO;  [facility] OR;  Service: Plastics;  Laterality: Left;  [name] MASTECTOMY Left 2000s   for breast cancer, also s/p radiation and chemo  . MITRAL VALVE REPLACEMENT     mechanical  . POLYPECTOMY  06/07/2018   Procedure: POLYPECTOMY;  Surgeon: Charlott Rakes, MD;  [facility];  Service: Endoscopy;;  . WOUND DEBRIDEMENT Left 12/28/2018   Procedure: EXCISION OF LEFT CHRONIC CHEST WALL WOUND;  Surgeon: Abigail Miyamoto, MD;  Location: [facility] OR;  Service: General;  Laterality: Left;    Family History  Problem Relation Age of Onset  . Heart attack Other   . Breast cancer Maternal Aunt 48    Social History   Socioeconomic History  . Marital status: Single    Spouse name: Not on file  . Number of children: Not on file  . Years of education: Not on file  . Highest education level: Not on file  Occupational History  . Not on file  Tobacco Use  . Smoking status: Current Every Day Smoker    Packs/day: 0.25    Years: 30.00    Pack years: 7.50    Types: Cigarettes  . Smokeless tobacco: Never Used  . Tobacco comment: Sometimes less.  Substance and Sexual Activity  . Alcohol use: Yes    Comment: beer- ocasional  . Drug use: No  . Sexual activity: Not Currently    Birth control/protection: None  Other Topics Concern  . Not on  file  Social History Narrative   ** Merged History Encounter **       Social Determinants of Health   Financial Resource Strain:   . Difficulty of Paying Living Expenses:   Food Insecurity:   . Worried About Programme researcher, broadcasting/film/video in the Last Year:   . Barista in the Last Year:   Transportation Needs:   . Freight forwarder (Medical):   [name] Lack of Transportation (Non-Medical):   Physical Activity:   . Days of Exercise per Week:   . Minutes of Exercise per Session:   Stress:   . Feeling of Stress :   Social Connections:   . Frequency of Communication with Friends and Family:   . Frequency of Social Gatherings with Friends  and Family:   . Attends Religious Services:   . Active Member of Clubs or Organizations:   . Attends Banker Meetings:   [name] Marital Status:   Intimate Partner Violence:   . Fear of Current or Ex-Partner:   . Emotionally Abused:   [name] Physically Abused:   . Sexually Abused:     Outpatient Medications Prior to Visit  Medication Sig Dispense Refill  . albuterol (PROVENTIL) (2.5 MG/3ML) 0.083% nebulizer solution Take 3 mLs (2.5 mg total) by nebulization every 6 (six) hours as needed for wheezing or shortness of breath. 75 mL 12  . albuterol (VENTOLIN HFA) 108 (90 Base) MCG/ACT inhaler Inhale 1-2 puffs into the lungs every 6 (six) hours as needed for wheezing or shortness of breath. 6.7 g 11  . Calcium-Phosphorus-Vitamin D (CITRACAL +D3 PO) Take 1 tablet by mouth 2 (two) times a day.    . DULoxetine (CYMBALTA) 60 MG capsule Take 1 capsule (60 mg total) by mouth daily. 30 capsule 0  . enoxaparin (LOVENOX) 80 MG/0.8ML injection Inject 0.75 mLs (75 mg total) into the skin every 12 (twelve) hours for 7 doses. 5.25 mL 0  . fluticasone (FLONASE) 50 MCG/ACT nasal spray Place 2 sprays into both nostrils daily. (Patient taking differently: Place 1 spray into both nostrils daily as needed for allergies. ) 16 g 2  . furosemide (LASIX) 40 MG tablet Take 1 tablet (40 mg total) by mouth daily. 30 tablet 11  . gabapentin (NEURONTIN) 600 MG tablet TAKE 1 TABLET BY MOUTH THREE TIMES DAILY 90 tablet 5  . lisinopril (ZESTRIL) 20 MG tablet Take 1 tablet (20 mg total) by mouth daily. 90 tablet 0  . oxyCODONE-acetaminophen (PERCOCET/ROXICET) 5-325 MG tablet Take 1 tablet by mouth every 6 (six) hours as needed for severe pain. 5 tablet 0  . polyethylene glycol (MIRALAX / GLYCOLAX) 17 g packet Take 17 g by mouth daily as needed for mild constipation. 14 each 0  . potassium chloride SA (KLOR-CON) 20 MEQ tablet Take 2 tablets (40 mEq total) by mouth daily. 60 tablet 0  . simvastatin (ZOCOR) 20 MG tablet Take 1  tablet by mouth once daily 90 tablet 1  . umeclidinium-vilanterol (ANORO ELLIPTA) 62.5-25 MCG/INH AEPB Inhale 1 puff by mouth once daily 180 each 1  . warfarin (COUMADIN) 5 MG tablet TAKE 1/2 TABLET ON THURSDAYS, SATURDAYS AND MONDAYS. ALL OTHER DAYS, TAKE ONE (1) TABLET. 24 tablet 1  . HYDROcodone-acetaminophen (NORCO/VICODIN) 5-325 MG tablet Take 1 tablet by mouth every 6 (six) hours as needed for up to 5 doses for severe pain. 5 tablet 0  . metoprolol succinate (TOPROL-XL) 25 MG 24 hr tablet Take 1 tablet (25 mg total) by mouth  daily. TAKE 1/2 (ONE-HALF) TABLET BY MOUTH ONCE DAILY 90 tablet 0   No facility-administered medications prior to visit.    Allergies  Allergen Reactions  . Acetaminophen-Codeine Itching  . Propoxyphene Itching    Darvocet  . Tape Rash    Plastic  . Tramadol Itching and Nausea And Vomiting    Review of Systems  Constitutional: Negative for chills and fever.  Gastrointestinal: Negative for nausea.  Skin: Negative for rash and wound.  Neurological: Negative for weakness and numbness.       Objective:    Physical Exam Constitutional:      General: She is not in acute distress.    Appearance: Normal appearance.  Cardiovascular:     Pulses: Normal pulses.  Musculoskeletal:     Right wrist: Swelling and tenderness present. Decreased range of motion.  Skin:    General: Skin is warm.     Findings: Erythema present.  Neurological:     Mental Status: She is alert.     Sensory: No sensory deficit.     Motor: No weakness.  Psychiatric:        Mood and Affect: Mood normal.        Behavior: Behavior normal.     BP 126/80 (BP Location: Right Arm, Patient Position: Sitting, Cuff Size: Normal)   Pulse 71   Temp 98.1 F (36.7 C) (Oral)   Wt 180 lb 1.6 oz (81.7 kg)   SpO2 99%   BMI 29.97 kg/m  Wt Readings from Last 3 Encounters:  10/18/19 180 lb 1.6 oz (81.7 kg)  10/17/19 170 lb (77.1 kg)  10/16/19 175 lb 3.2 oz (79.5 kg)    Health Maintenance  Due  Topic Date Due  . PNA vac Low Risk Adult (2 of 2 - PPSV23) 11/10/2018  . MAMMOGRAM  07/13/2019    There are no preventive care reminders to display for this patient.   No results found for: TSH Lab Results  Component Value Date   WBC 8.9 08/08/2019   HGB 13.7 08/08/2019   HCT 42.3 08/08/2019   MCV 98.4 08/08/2019   PLT 471 (H) 08/08/2019   Lab Results  Component Value Date   NA 135 08/08/2019   K 2.9 (L) 08/08/2019   CO2 23 08/08/2019   GLUCOSE 106 (H) 08/08/2019   BUN 5 (L) 08/08/2019   CREATININE 0.74 08/08/2019   BILITOT 0.2 (L) 01/31/2019   ALKPHOS 71 01/31/2019   AST 20 01/31/2019   ALT 11 01/31/2019   PROT 6.6 01/31/2019   ALBUMIN 2.3 (L) 01/31/2019   CALCIUM 9.3 08/08/2019   ANIONGAP 15 08/08/2019   Lab Results  Component Value Date   CHOL 177 04/14/2018   Lab Results  Component Value Date   HDL 62 04/14/2018   Lab Results  Component Value Date   LDLCALC 96 04/14/2018   Lab Results  Component Value Date   TRIG 97 04/14/2018   Lab Results  Component Value Date   CHOLHDL 2.9 04/14/2018   Lab Results  Component Value Date   HGBA1C 5.3 07/13/2015       Assessment & Plan:   Problem List Items Addressed This Visit      Musculoskeletal and Integument   Monoarthritis of wrist - Primary    Patient presents with acute onset right wrist pain that began 2 days ago. No trauma or injury. She went to the ED yesterday for evaluation of the same pain. She has never had similar symptoms before. No fevers,  chills, or systemic signs of infection. Denies any other joint pain or swelling. On exam, distal radius is warm, erythematous and swollen. She is unable to flex or extend her wrist due to pain. Sensation and pulses are intact. Given that this is her first presentation of monoarthritis, will place urgent referral to orthopedics for arthrocentesis before empirically treating, as septic joint cannot be ruled out. Other possible etiologies include OA, crystal  arthropathy.  Will provide short course of narcotics for pain until she can get to appointment which has been scheduled for first thing tomorrow morning at [facility].       Relevant Medications   HYDROcodone-acetaminophen (NORCO/VICODIN) 5-325 MG tablet   Other Relevant Orders   Ambulatory referral to Orthopedic Surgery       Meds ordered this encounter  Medications  . HYDROcodone-acetaminophen (NORCO/VICODIN) 5-325 MG tablet    Sig: Take 1 tablet by mouth every 6 (six) hours as needed for up to 5 doses for severe pain.    Dispense:  5 tablet    Refill:  0     Sumayya Muha D Shanea Karney, DO

## 2019-10-27 NOTE — Assessment & Plan Note (Signed)
Patient presents with acute onset right wrist pain that began 2 days ago. No trauma or injury. She went to the ED yesterday for evaluation of the same pain. She has never had similar symptoms before. No fevers, chills, or systemic signs of infection. Denies any other joint pain or swelling. On exam, distal radius is warm, erythematous and swollen. She is unable to flex or extend her wrist due to pain. Sensation and pulses are intact. Given that this is her first presentation of monoarthritis, will place urgent referral to orthopedics for arthrocentesis before empirically treating, as septic joint cannot be ruled out. Other possible etiologies include OA, crystal arthropathy.  Will provide short course of narcotics for pain until she can get to appointment which has been scheduled for first thing tomorrow morning at [facility].

## 2019-10-30 NOTE — Progress Notes (Signed)
Internal Medicine Clinic Attending  Case discussed with Dr. Bloomfield at the time of the visit.  We reviewed the resident's history and exam and pertinent patient test results.  I agree with the assessment, diagnosis, and plan of care documented in the resident's note.  

## 2019-11-01 ENCOUNTER — Encounter
Payer: Medicare Other | Attending: Physical Medicine and Rehabilitation | Admitting: Physical Medicine and Rehabilitation

## 2019-11-01 ENCOUNTER — Other Ambulatory Visit: Payer: Self-pay

## 2019-11-01 ENCOUNTER — Encounter: Payer: Self-pay | Admitting: Physical Medicine and Rehabilitation

## 2019-11-01 VITALS — BP 175/94 | HR 80 | Temp 97.2°F | Ht 65.0 in | Wt 177.8 lb

## 2019-11-01 DIAGNOSIS — M5441 Lumbago with sciatica, right side: Secondary | ICD-10-CM | POA: Diagnosis not present

## 2019-11-01 DIAGNOSIS — G8929 Other chronic pain: Secondary | ICD-10-CM | POA: Insufficient documentation

## 2019-11-01 DIAGNOSIS — Z5181 Encounter for therapeutic drug level monitoring: Secondary | ICD-10-CM | POA: Insufficient documentation

## 2019-11-01 DIAGNOSIS — M25559 Pain in unspecified hip: Secondary | ICD-10-CM | POA: Diagnosis not present

## 2019-11-01 MED ORDER — GABAPENTIN 600 MG PO TABS: 600.0000 mg | ORAL_TABLET | Freq: Four times a day (QID) | ORAL | 5 refills | Status: DC

## 2019-11-01 NOTE — Patient Instructions (Signed)
Patient is a 69 yr old female with aortic stenosis s/p AVR on Coumadin,  HTN, hx of Pes on chronic coumadind, CHF, and COPD- here for evaluation of R side low back  Pain with sciatica. Tramadol makes her ill as well as Tylenol #3 and Darvocet.   1. BP is 175/94 this AM (thinks due to cough meds from dry cough/pollen)  2. Urine drug screen and opiate contract- just in case.   3. Increase Gabapentin - has taken up to 3000 mg/day in past with some improvement. Will start 600 mg 4xday x 4 days then go to 1200 mg 3x/day- for nerve pain.   4. If not enough/effective, will try Lyrica.   5. If these are not helpful, will further discuss additional medications- has already failed Duloxetine.   6. Will have f/u in 4 weeks- also do trigger point injections. Doesn't like needles

## 2019-11-01 NOTE — Progress Notes (Signed)
Subjective:    Patient ID: Kim Anthony, female    DOB: Feb 09, 1951, 69 y.o.   [name]: [id number]  HPI    Patient is a 69 yr old female with aortic stenosis s/p AVR on Coumadin,  HTN, hx of Pes on chronic coumadind, CHF, and COPD- here for evaluation of R side low back  Pain with sciatica.   Had back pain for "a long time"- was getting Epidural steroid injections in [place], but they were NOT helpful.  Didn't even last a short period of time.   On Gabapentin- 600 mg TID- takes more than prescribed- sometimes helps if takes more- so will take 600 mg 5x/day (so 3000 mg/day).  No side effects from taking Gabapentin.   Takes ibuprofen- doesn't help.  Hurts so bad, when sits or stands a long time- more like stabbing pain-  Also has pain that radiates into R groin, not down RLE.   Has tried Gabapentin Duloxetine- not effective when tried it.  Never tried Lyrica.   Has also tried Norco and Percocet has gotten a few from ER-  Feels better- but only lasts for a short period of time.  Has tried muscle relaxants- flexeril- etc- no improvement  Xrays shows facet DJD L4-S1- but nothing else- vertebral bodies heights OK- hasn't gotten MRI because scared/claustophobic, so hasn't gotten one.        Pain Inventory Average Pain 8 Pain Right Now 8 My pain is stabbing and aching  In the last 24 hours, has pain interfered with the following? General activity 7 Relation with others 0 Enjoyment of life 0 What TIME of day is your pain at its worst? morning and night Sleep (in general) Poor  Pain is worse with: walking and standing Pain improves with: rest Relief from Meds: 1  Mobility walk without assistance how many minutes can you walk? 10 ability to climb steps?  yes do you drive?  yes  Function not employed: date last employed .  Neuro/Psych bladder control problems trouble walking  Prior Studies Any changes since last visit?  no  Physicians involved in your care Any  changes since last visit?  no   Family History  Problem Relation Age of Onset  . Heart attack Other   . Breast cancer Maternal Aunt 40   Social History   Socioeconomic History  . Marital status: Single    Spouse name: Not on file  . Number of children: Not on file  . Years of education: Not on file  . Highest education level: Not on file  Occupational History  . Not on file  Tobacco Use  . Smoking status: Current Every Day Smoker    Packs/day: 0.25    Years: 30.00    Pack years: 7.50    Types: Cigarettes  . Smokeless tobacco: Never Used  . Tobacco comment: Sometimes less.  Substance and Sexual Activity  . Alcohol use: Yes    Comment: beer- ocasional  . Drug use: No  . Sexual activity: Not Currently    Birth control/protection: None  Other Topics Concern  . Not on file  Social History Narrative   ** Merged History Encounter **       Social Determinants of Health   Financial Resource Strain:   . Difficulty of Paying Living Expenses:   Food Insecurity:   . Worried About Charity fundraiser in the Last Year:   . Arboriculturist in the Last Year:   Transportation Needs:   .  Lack of Transportation (Medical):   [name] Lack of Transportation (Non-Medical):   Physical Activity:   . Days of Exercise per Week:   . Minutes of Exercise per Session:   Stress:   . Feeling of Stress :   Social Connections:   . Frequency of Communication with Friends and Family:   . Frequency of Social Gatherings with Friends and Family:   . Attends Religious Services:   . Active Member of Clubs or Organizations:   . Attends Archivist Meetings:   [name] Marital Status:    Past Surgical History:  Procedure Laterality Date  . APPLICATION OF A-CELL OF CHEST/ABDOMEN Left 01/27/2019   Procedure: APPLICATION OF A-CELL OF CHEST;  Surgeon: Wallace Going, DO;  Location: [place];  Service: Plastics;  Laterality: Left;  . APPLICATION OF WOUND VAC N/A 01/27/2019   Procedure: APPLICATION OF  WOUND VAC;  Surgeon: Wallace Going, DO;  Location: [place];  Service: Plastics;  Laterality: N/A;  . COLONOSCOPY WITH PROPOFOL N/A 06/07/2018   Procedure: COLONOSCOPY WITH PROPOFOL;  Surgeon: Wilford Corner, MD;  [facility];  Service: Endoscopy;  Laterality: N/A;  . DEBRIDEMENT AND CLOSURE WOUND Left 12/28/2018   Procedure: Debridement And Closure Wound;  Surgeon: Coralie Keens, MD;  Location: [place];  Service: General;  Laterality: Left;  . INCISION AND DRAINAGE OF WOUND Left 01/27/2019   Procedure: IRRIGATION AND DEBRIDEMENT OF CHEST WALL;  Surgeon: Wallace Going, DO;  Location: [place];  Service: Plastics;  Laterality: Left;  . LATISSIMUS FLAP TO BREAST Left 01/18/2019   Procedure: LEFT LATISSIMUS FLAP TO BREAST;  Surgeon: Wallace Going, DO;  Location: [place];  Service: Plastics;  Laterality: Left;  [name] MASTECTOMY Left 2000s   for breast cancer, also s/p radiation and chemo  . MITRAL VALVE REPLACEMENT     mechanical  . POLYPECTOMY  06/07/2018   Procedure: POLYPECTOMY;  Surgeon: Wilford Corner, MD;  [facility];  Service: Endoscopy;;  . WOUND DEBRIDEMENT Left 12/28/2018   Procedure: EXCISION OF LEFT CHRONIC CHEST WALL WOUND;  Surgeon: Coralie Keens, MD;  Location: [place];  Service: General;  Laterality: Left;   Past Medical History:  Diagnosis Date  . Arthritis   . Asthma   . Breast cancer ([place]) 2001   Left Breast Cancer  . CHF (congestive heart failure) ([place])   . COPD (chronic obstructive pulmonary disease) ([place])   . Coronary artery disease   . History of mitral valve replacement with mechanical valve   . HLD (hyperlipidemia)   . Hypertension   . Pulmonary embolism (HCC)    BP (!) 175/94   Pulse 80   Temp (!) 97.2 F (36.2 C)   Ht 5\' 5"  (1.651 m)   Wt 177 lb 12.8 oz (80.6 kg)   SpO2 92%   BMI 29.59 kg/m   Opioid Risk Score:   Fall Risk Score:  `1  Depression screen PHQ 2/9  Depression screen [facility] 2/9 11/01/2019 09/14/2019  08/16/2019 03/09/2019 04/28/2018 04/21/2018 03/24/2018  Decreased Interest 0 0 0 0 0 0 0  Down, Depressed, Hopeless 0 0 0 0 0 0 0  PHQ - 2 Score 0 0 0 0 0 0 0  Altered sleeping 3 1 0 - - - -  Tired, decreased energy 1 1 0 - - - -  Change in appetite 1 0 0 - - - -  Feeling bad or failure about yourself  0 0 0 - - - -  Trouble  concentrating 0 0 0 - - - -  Moving slowly or fidgety/restless 0 0 0 - - - -  Suicidal thoughts 0 0 0 - - - -  PHQ-9 Score 5 2 0 - - - -  Difficult doing work/chores Somewhat difficult Not difficult at all Not difficult at all - - - -  Some recent data might be hidden    Review of Systems  Constitutional: Negative.   HENT: Negative.   Eyes: Negative.   Respiratory: Positive for cough and shortness of breath.   Cardiovascular: Positive for leg swelling.  Gastrointestinal: Negative.   Endocrine: Negative.   Genitourinary:       Bladder control  Musculoskeletal: Positive for back pain and gait problem.  Skin: Positive for rash.  Allergic/Immunologic: Negative.   Hematological: Bruises/bleeds easily.       Coumadin  Psychiatric/Behavioral: Negative.   All other systems reviewed and are negative.      Objective:   Physical Exam  Awake, alert, appropriate, sitting up in table, NAD MS RLE- HF 4-/5 (pain?), otherwise 5/5 in KE, KF, DF and PF LLE_ 5/5 in same muscles Lumbar flexion increases pain Lumbar extension/rotation also makes it worse R L1-L3 paraspinal trigger points/muscle spasms   Neuro: Sensation intact to light touch B/L in LEs       Assessment & Plan:    Patient is a 69 yr old female with aortic stenosis s/p AVR on Coumadin,  HTN, hx of Pes on chronic coumadind, CHF, and COPD- here for evaluation of R side low back  Pain with sciatica. Tramadol makes her ill as well as Tylenol #3 and Darvocet.   1. BP is 175/94 this AM (thinks due to cough meds from dry cough/pollen)  2. Urine drug screen and opiate contract  3. Increase Gabapentin -  has taken up to 3000 mg/day in past with some improvement. Will start 600 mg 4xday x 4 days then go to 1200 mg 3x/day- for nerve pain.   4. If not enough/effective, will try Lyrica.   5. If these are not helpful, will further discuss additional medications- has already failed Duloxetine.   6. Will have f/u in 4 weeks- also do trigger point injections. Doesn't like needles   I spent a total of 50 minute son appointment- as detailed above.

## 2019-11-03 ENCOUNTER — Ambulatory Visit: Payer: Medicare Other | Admitting: *Deleted

## 2019-11-03 DIAGNOSIS — J449 Chronic obstructive pulmonary disease, unspecified: Secondary | ICD-10-CM

## 2019-11-03 DIAGNOSIS — I10 Essential (primary) hypertension: Secondary | ICD-10-CM

## 2019-11-03 DIAGNOSIS — I5042 Chronic combined systolic (congestive) and diastolic (congestive) heart failure: Secondary | ICD-10-CM

## 2019-11-03 NOTE — Chronic Care Management (AMB) (Signed)
  Chronic Care Management   Note  11/03/2019 Name: Kim Anthony [name]: [id number] DOB: 1951/05/16  Successful outreach to patient, explained CCM program and she agrees to participate.She  requests initial telephone intake be completed next week in the morning.   Follow up plan: Telephone follow up appointment with care management team member scheduled for:11/07/19 at 9:00 am.  Kelli Churn RN, CCM, [facility] RN Care Manager [phone]

## 2019-11-06 DIAGNOSIS — M5441 Lumbago with sciatica, right side: Secondary | ICD-10-CM | POA: Diagnosis not present

## 2019-11-06 DIAGNOSIS — Z5181 Encounter for therapeutic drug level monitoring: Secondary | ICD-10-CM | POA: Diagnosis not present

## 2019-11-06 DIAGNOSIS — G8929 Other chronic pain: Secondary | ICD-10-CM | POA: Diagnosis not present

## 2019-11-07 ENCOUNTER — Telehealth: Payer: Medicare Other

## 2019-11-07 ENCOUNTER — Ambulatory Visit: Payer: Self-pay | Admitting: *Deleted

## 2019-11-07 DIAGNOSIS — J449 Chronic obstructive pulmonary disease, unspecified: Secondary | ICD-10-CM

## 2019-11-07 DIAGNOSIS — I5042 Chronic combined systolic (congestive) and diastolic (congestive) heart failure: Secondary | ICD-10-CM

## 2019-11-07 DIAGNOSIS — I10 Essential (primary) hypertension: Secondary | ICD-10-CM

## 2019-11-07 NOTE — Chronic Care Management (AMB) (Signed)
  Chronic Care Management   Note  11/07/2019 Name: Kim Anthony [name]: [id number] DOB: 1950-10-28  Successful outreach to patient to complete initial assessment. She states she is at the bank and requests a call back later in the week.   Follow up plan: Telephone follow up appointment with care management team member scheduled for:11/10/19 at 10:30 am  Kelli Churn RN, CCM, [facility] RN Care Manager [phone]

## 2019-11-09 LAB — TOXASSURE SELECT,+ANTIDEPR,UR

## 2019-11-10 ENCOUNTER — Telehealth: Payer: Medicare Other

## 2019-11-10 ENCOUNTER — Ambulatory Visit: Payer: Self-pay | Admitting: *Deleted

## 2019-11-10 DIAGNOSIS — I10 Essential (primary) hypertension: Secondary | ICD-10-CM

## 2019-11-10 DIAGNOSIS — I5042 Chronic combined systolic (congestive) and diastolic (congestive) heart failure: Secondary | ICD-10-CM

## 2019-11-10 NOTE — Chronic Care Management (AMB) (Signed)
  Chronic Care Management   Outreach Note  11/10/2019 Name: Kim Anthony [name]: [id number] DOB: 1950/11/21  Referred by: Marianna Payment, MD Reason for referral : No chief complaint on file.   An unsuccessful telephone outreach was attempted today. The patient was referred to the case management team for assistance with care management and care coordination.  Attempted to reach patient to complete initial telephone assessment scheduled for today. Unable to leave message as recording states voice mail box has not been set up  Yet.   Follow Up Plan: The care management team will reach out to the patient again over the next 7 days.   Kelli Churn RN, CCM, [facility] RN Care Manager [phone]

## 2019-11-15 ENCOUNTER — Ambulatory Visit: Payer: Medicare Other | Admitting: *Deleted

## 2019-11-15 DIAGNOSIS — I10 Essential (primary) hypertension: Secondary | ICD-10-CM

## 2019-11-15 DIAGNOSIS — I5042 Chronic combined systolic (congestive) and diastolic (congestive) heart failure: Secondary | ICD-10-CM

## 2019-11-15 NOTE — Chronic Care Management (AMB) (Signed)
  Chronic Care Management   Note  11/15/2019 Name: Kim Anthony [name]: [id number] DOB: 1950/10/25  Successful outreach to patient for purpose of completing the initial phone assessment. Patet states she is out shopping and requests return call next week.  Follow up plan: Telephone follow up appointment with care management team member scheduled for:11/21/19 at 9:00 am.  Kelli Churn RN, CCM, [facility] RN Care Manager [phone]

## 2019-11-16 ENCOUNTER — Telehealth: Payer: Self-pay | Admitting: *Deleted

## 2019-11-16 NOTE — Telephone Encounter (Signed)
She "supposedly" went and couldn't pee- told her to do that day- we will need to check again at f/u- and no meds til we know what's going on- to be honest- I feel bad, but I don't feel comfortable mixing pain meds with alcohol, esp enough to be on testing.   Thanks, ML

## 2019-11-16 NOTE — Telephone Encounter (Signed)
Urine drug screen is positive for alcohol. There are no medications present.  This drug screen was performed on 11/06/19?, and the encounter date it was ordered was 11/01/19. This was 5 days later than the request for UDS was made.

## 2019-11-17 NOTE — Telephone Encounter (Signed)
Drug screen scheduled for next visit.

## 2019-11-21 ENCOUNTER — Telehealth: Payer: Medicare Other

## 2019-11-21 ENCOUNTER — Ambulatory Visit: Payer: Self-pay | Admitting: *Deleted

## 2019-11-21 DIAGNOSIS — J449 Chronic obstructive pulmonary disease, unspecified: Secondary | ICD-10-CM

## 2019-11-21 DIAGNOSIS — I5042 Chronic combined systolic (congestive) and diastolic (congestive) heart failure: Secondary | ICD-10-CM

## 2019-11-21 DIAGNOSIS — I10 Essential (primary) hypertension: Secondary | ICD-10-CM

## 2019-11-21 NOTE — Chronic Care Management (AMB) (Signed)
  Chronic Care Management   Outreach Note  11/21/2019 Name: Kim Anthony [name]: [id number] DOB: [birth year]  Referred by: Marianna Payment, MD Reason for referral : Chronic Care Management (COPD, HTN, HF)   An unsuccessful telephone outreach was attempted to complete initial intake assessment that was scheduled for today. The patient was referred to the case management team for assistance with care management and care coordination. Unable to leave message as recording states voice mailbox has not been set up yet.  Follow Up Plan: The care management team will reach out to the patient again over the next 7 days.   Kelli Churn RN, CCM, [facility] RN Care Manager [phone]

## 2019-11-27 ENCOUNTER — Ambulatory Visit: Payer: Medicare Other

## 2019-11-28 ENCOUNTER — Ambulatory Visit: Payer: Medicare Other | Admitting: *Deleted

## 2019-11-28 DIAGNOSIS — J449 Chronic obstructive pulmonary disease, unspecified: Secondary | ICD-10-CM

## 2019-11-28 DIAGNOSIS — E785 Hyperlipidemia, unspecified: Secondary | ICD-10-CM

## 2019-11-28 DIAGNOSIS — I5042 Chronic combined systolic (congestive) and diastolic (congestive) heart failure: Secondary | ICD-10-CM

## 2019-11-28 DIAGNOSIS — I10 Essential (primary) hypertension: Secondary | ICD-10-CM

## 2019-11-28 MED ORDER — STIOLTO RESPIMAT 2.5-2.5 MCG/ACT IN AERS: 2.0000 | INHALATION_SPRAY | Freq: Every day | RESPIRATORY_TRACT | 2 refills | Status: DC

## 2019-11-28 NOTE — Patient Instructions (Signed)
Visit Information It was nice speaking with you today. I will call you in a month to discuss the educational material I am mailing to you.  Goals Addressed            This Visit's Progress     Patient Stated    "I'm not on a long acting inhaler but I use to be." (pt-stated)       CARE PLAN ENTRY (see longitudinal plan of care for additional care plan information)  Current Barriers:   Chronic Disease Management support, education, and care coordination needs related to CHF, CAD, HTN, HLD, COPD, and Asthma - patient says she takes her medications as directed but has not been using a long acting inhaler for her COPD and asthma because Medicaid stopped covering Anoro, she says she is using her rescue inhaler and nebulizer treatments more often since stopping the Anoro, she says she is very sensitive to pollen, she says she does not monitor her blood pressure but she will begin when the Remote Health Nurse, who sees her monthly, provides a BP monitor. She also says the Remote Health nurse checks her BP monthly and it is always good, she reports good medication taking behavior, has chronic back pain and sciatic nerve pain; has started with a new pain management MD in the last month, she says injections and physical therapy have not helped in the past, she says her thumb pain is severe and that she saw Dr Alfonso Ramus last month was prescribed  Prednisone that helped significantly, she says she has a home INR machine but has not been checking her weekly INR and then having her daughter text the result to Dr. Elie Confer at the clinic, Pt says she has not had issues with her heart since her open heart surgery in 2001  Clinical Goal(s) related to CHF, CAD, HTN, HLD, COPD, and asthma :  Over the next 30 days, patient will:   Work with the care management team to address educational, disease management, and care coordination needs   Begin or continue self health monitoring activities as directed today Measure and  record blood pressure 1-2 times per week and as needed  when you are able to get a home BP monitor  Call provider office for new or worsened signs and symptoms of CHF, HTN, HLD, COPD, and Asthma  Call care management team with questions or concerns  Verbalize basic understanding of patient centered plan of care established today  Interventions related to CHF, CAD, HTN, HLD, COPD, and Asthma :   Evaluation of current treatment plans and patient's adherence to plan as established by provider  Completed medication reconciliation- will message provider about need to order long acting inhaler ( either Stiolto Respimet or Bevespi as these are covered by Medicaid) since Medicaid is no longer covering Anovo  Assessed patient understanding of disease states  Reviewed the importance INR monitoring while on Coumadin, advised her to resume weekly home INR checks and have her daughter text the results to [name]  Assessed patient's education and care coordination needs  Provided disease specific education to patient - mailed patient [facility] spiral bound calendar with Action Plans for HF, COPD and HTN and Emmi education articles:  Checking Your Blood Pressure At Home, Asthma Action Plan,  and COPD When To Get Help  Collaborated with appropriate clinical care team members regarding patient needs  Reviewed upcoming appointments and provided patient with phone number for Dr. Alfonso Ramus so she can make  follow up appointment for her thumb pain  Patient Self Care Activities related to CHF, HTN, HLD, COPD, and Asthma :   Patient is unable to independently self-manage chronic health conditions  Initial goal documentation        Ms. Kubicki was given information about Chronic Care Management services today including:  1. CCM service includes personalized support from designated clinical staff supervised by her physician, including individualized plan of care and coordination  with other care providers 2. 24/7 contact phone numbers for assistance for urgent and routine care needs. 3. Service will only be billed when office clinical staff spend 20 minutes or more in a month to coordinate care. 4. Only one practitioner may furnish and bill the service in a calendar month. 5. The patient may stop CCM services at any time (effective at the end of the month) by phone call to the office staff. 6. The patient will be responsible for cost sharing (co-pay) of up to 20% of the service fee (after annual deductible is met).  Patient agreed to services and verbal consent obtained.   The patient verbalized understanding of instructions provided today and declined a print copy of patient instruction materials.   The care management team will reach out to the patient again over the next 30 days.   Kelli Churn RN, CCM, [facility] RN Care Manager [phone]

## 2019-11-28 NOTE — Addendum Note (Signed)
Addended by: Asencion Noble on: 11/28/2019 05:13 PM   Modules accepted: Orders

## 2019-11-28 NOTE — Chronic Care Management (AMB) (Signed)
Chronic Care Management   Initial Visit Note  11/28/2019 Name: Kim Anthony [name]: [id number] DOB: 1951-02-14  Referred by: Kim Payment, MD Reason for referral : Care Coordination (HF, HLD, COPD)   Kim Anthony is a 69 y.o. year old female who is a primary care patient of Kim Payment, MD. The CCM team was consulted for assistance with chronic disease management and care coordination needs related to CHF, CAD, HTN, HLD and COPD and Asthma  Review of patient status, including review of consultants reports, relevant laboratory and other test results, and collaboration with appropriate care team members and the patient's provider was performed as part of comprehensive patient evaluation and provision of chronic care management services.    SDOH (Social Determinants of Health) assessments performed: Yes See Care Plan activities for detailed interventions related to SDOH  SDOH Interventions     Most Recent Value  SDOH Interventions  Financial Strain Interventions  Intervention Not Indicated  Housing Interventions  Intervention Not Indicated  Social Connections Interventions  Intervention Not Indicated  Transportation Interventions  Intervention Not Indicated       Medications: Outpatient Encounter Medications as of 11/28/2019  Medication Sig Note  . albuterol (PROVENTIL) (2.5 MG/3ML) 0.083% nebulizer solution Take 3 mLs (2.5 mg total) by nebulization every 6 (six) hours as needed for wheezing or shortness of breath. 11/28/2019: Uses 3 times week during high pollen  . albuterol (VENTOLIN HFA) 108 (90 Base) MCG/ACT inhaler Inhale 1-2 puffs into the lungs every 6 (six) hours as needed for wheezing or shortness of breath. 11/28/2019: Uses twice daily  . Calcium-Phosphorus-Vitamin D (CITRACAL +D3 PO) Take 1 tablet by mouth 2 (two) times a day.   . fluticasone (FLONASE) 50 MCG/ACT nasal spray Place 2 sprays into both nostrils daily. (Patient taking differently: Place 1 spray into both nostrils daily as  needed for allergies. )   . furosemide (LASIX) 40 MG tablet Take 1 tablet (40 mg total) by mouth daily.   [name] gabapentin (NEURONTIN) 600 MG tablet Take 1 tablet (600 mg total) by mouth 4 (four) times daily. X 4 days, then go to 1200 mg 3x/day. For nerve pain Patient says it is ineffective in relieving back and sciatica pain  . lisinopril (ZESTRIL) 20 MG tablet Take 1 tablet (20 mg total) by mouth daily.   . metoprolol succinate (TOPROL-XL) 25 MG 24 hr tablet Take 1 tablet (25 mg total) by mouth daily.   . potassium chloride SA (KLOR-CON) 20 MEQ tablet Take 2 tablets (40 mEq total) by mouth daily.   . simvastatin (ZOCOR) 20 MG tablet Take 1 tablet by mouth once daily   . warfarin (COUMADIN) 5 MG tablet TAKE 1/2 TABLET ON THURSDAYS, SATURDAYS AND MONDAYS. ALL OTHER DAYS, TAKE ONE (1) TABLET.   [name] umeclidinium-vilanterol (ANORO ELLIPTA) 62.5-25 MCG/INH AEPB Inhale 1 puff by mouth once daily (Patient not taking: Reported on 11/01/2019) 11/28/2019: Says Medicaid will not cover it  . [DISCONTINUED] HYDROcodone-acetaminophen (NORCO/VICODIN) 5-325 MG tablet Take 1 tablet by mouth every 6 (six) hours as needed for up to 5 doses for severe pain. (Patient not taking: Reported on 11/28/2019) 11/01/2019: Did not bring bottle. Says got in ER for hand pain and does not have any left  . [DISCONTINUED] oxyCODONE-acetaminophen (PERCOCET/ROXICET) 5-325 MG tablet Take 1 tablet by mouth every 6 (six) hours as needed for severe pain. (Patient not taking: Reported on 11/01/2019) 11/01/2019: Did not bring bottle, and says she does not have any now   No facility-administered encounter medications on  file as of 11/28/2019.     Objective:  Results of echocardiogram of 01/20/20- The left ventricle has normal systolic function, with an ejection  fraction of 55-60%. Left ventricular diastolic function could not be  evaluated.   BP Readings from Last 3 Encounters:  11/01/19 (!) 175/94  10/18/19 126/80  10/17/19 (!) 134/100   Wt  Readings from Last 3 Encounters:  11/01/19 177 lb 12.8 oz (80.6 kg)  10/18/19 180 lb 1.6 oz (81.7 kg)  10/17/19 170 lb (77.1 kg)   Lab Results  Component Value Date   CHOL 177 04/14/2018   HDL 62 04/14/2018   LDLCALC 96 04/14/2018   TRIG 97 04/14/2018   CHOLHDL 2.9 04/14/2018  Needs updated lipid panel- on statin therapy  Goals Addressed            This Visit's Progress     Patient Stated   . "I'm not on a long acting inhaler but I use to be." (pt-stated)       CARE PLAN ENTRY (see longitudinal plan of care for additional care plan information)  Current Barriers:  . Chronic Disease Management support, education, and care coordination needs related to CHF, CAD, HTN, HLD, COPD, and Asthma - patient says she takes her medications as directed but has not been using a long acting inhaler for her COPD and asthma because Medicaid stopped covering Anoro, she says she is using her rescue inhaler and nebulizer treatments more often since stopping the Anoro, she says she is very sensitive to pollen, she says she does not monitor her blood pressure but she will begin when the Remote Health Nurse, who sees her monthly, provides a BP monitor. She also says the Remote Health nurse checks her BP monthly and it is always good, she reports good medication taking behavior, has chronic back pain and sciatic nerve pain; has started with a new pain management MD in the last month, she says injections and physical therapy have not helped in the past, she says her thumb pain is severe and that she saw Dr Alfonso Ramus last month was prescribed  Prednisone that helped significantly, she says she has a home INR machine but has not been checking her weekly INR and then having her daughter text the result to Dr. Elie Confer at the clinic. Pt says she has not had issues with her heart since her open heart surgery in 2001  Clinical Goal(s) related to CHF, CAD, HTN, HLD, COPD, and asthma :  Over the next 30 days, patient will:    . Work with the care management team to address educational, disease management, and care coordination needs  . Begin or continue self health monitoring activities as directed today Measure and record blood pressure 1-2 times per week and as needed  when you are able to get a home BP monitor . Call provider office for new or worsened signs and symptoms of CHF, HTN, HLD, COPD, and Asthma . Call care management team with questions or concerns . Verbalize basic understanding of patient centered plan of care established today  Interventions related to CHF, CAD, HTN, HLD, COPD, and Asthma :  . Evaluation of current treatment plans and patient's adherence to plan as established by provider . Completed medication reconciliation- will message provider about need to order long acting inhaler ( either Stiolto Respimet or Bevespi as these are covered by Medicaid) since Medicaid is no longer covering Anovo . Assessed patient understanding of disease states . Reviewed the importance INR monitoring  while on Coumadin, advised her to resume weekly home INR checks and have her daughter text the results to [name] . Assessed patient's education and care coordination needs . Provided disease specific education to patient -mailed patient [facility] spiral bound calendar with Action Plans for HF, COPD and HTN and Emmi education articles:  Checking Your Blood Pressure At Home, Asthma Action Plan,  and COPD When To Get Help . Collaborated with appropriate clinical care team members regarding patient needs . Reviewed upcoming appointments and provided patient with phone number for Dr. Alfonso Ramus so she can make follow up appointment for her thumb pain  Patient Self Care Activities related to CHF, HTN, HLD, COPD, and Asthma :  . Patient is unable to independently self-manage chronic health conditions  Initial goal documentation         Ms. Fredlund was given information about Chronic Care  Management services today including:  1. CCM service includes personalized support from designated clinical staff supervised by her physician, including individualized plan of care and coordination with other care providers 2. 24/7 contact phone numbers for assistance for urgent and routine care needs. 3. Service will only be billed when office clinical staff spend 20 minutes or more in a month to coordinate care. 4. Only one practitioner may furnish and bill the service in a calendar month. 5. The patient may stop CCM services at any time (effective at the end of the month) by phone call to the office staff. 6. The patient will be responsible for cost sharing (co-pay) of up to 20% of the service fee (after annual deductible is met).  Patient agreed to services and verbal consent obtained.   Plan:   The care management team will reach out to the patient again over the next 30 days.   Kelli Churn RN, CCM, [facility] RN Care Manager [phone]

## 2019-11-28 NOTE — Progress Notes (Signed)
Internal Medicine Clinic Resident I have personally reviewed this encounter including the documentation in this note and/or discussed this patient with the care management provider. I will address any urgent items identified by the care management provider and will communicate my actions to the patient's PCP. Ordered lipid panel for future. Anoro no longer covered by her insurance, reported Stiolto was. Contacted patient and advised her that I will send this new medication in. All questions and concerns were addressed. I have reviewed the patient's CCM visit with my supervising attending, Dr Heber [place].  Asencion Noble, MD 11/28/2019

## 2019-11-29 ENCOUNTER — Encounter: Payer: Medicare Other | Admitting: Physical Medicine and Rehabilitation

## 2019-11-29 ENCOUNTER — Other Ambulatory Visit: Payer: Self-pay | Admitting: Pharmacist

## 2019-11-29 NOTE — Telephone Encounter (Signed)
Please see INR results from 10/16/2019. 2 different results entered: 1.1 and 5.0. Please advise. Hubbard Hartshorn, BSN, RN-BC

## 2019-12-04 ENCOUNTER — Other Ambulatory Visit: Payer: Self-pay

## 2019-12-04 ENCOUNTER — Encounter: Payer: Self-pay | Admitting: Physical Medicine and Rehabilitation

## 2019-12-04 ENCOUNTER — Encounter
Payer: Medicare Other | Attending: Physical Medicine and Rehabilitation | Admitting: Physical Medicine and Rehabilitation

## 2019-12-04 VITALS — BP 172/85 | HR 102 | Temp 97.5°F | Ht 65.0 in | Wt 182.0 lb

## 2019-12-04 DIAGNOSIS — Z5181 Encounter for therapeutic drug level monitoring: Secondary | ICD-10-CM | POA: Diagnosis not present

## 2019-12-04 DIAGNOSIS — M5386 Other specified dorsopathies, lumbar region: Secondary | ICD-10-CM

## 2019-12-04 DIAGNOSIS — Z79891 Long term (current) use of opiate analgesic: Secondary | ICD-10-CM | POA: Diagnosis not present

## 2019-12-04 DIAGNOSIS — G8929 Other chronic pain: Secondary | ICD-10-CM | POA: Insufficient documentation

## 2019-12-04 DIAGNOSIS — G894 Chronic pain syndrome: Secondary | ICD-10-CM | POA: Diagnosis not present

## 2019-12-04 DIAGNOSIS — M5441 Lumbago with sciatica, right side: Secondary | ICD-10-CM | POA: Diagnosis present

## 2019-12-04 MED ORDER — PREGABALIN 50 MG PO CAPS: 50.0000 mg | ORAL_CAPSULE | Freq: Three times a day (TID) | ORAL | 0 refills | Status: DC

## 2019-12-04 MED ORDER — PREGABALIN 100 MG PO CAPS: 100.0000 mg | ORAL_CAPSULE | Freq: Three times a day (TID) | ORAL | 3 refills | Status: DC

## 2019-12-04 NOTE — Progress Notes (Signed)
Patient is a 69 yr old female with aortic stenosis s/p AVR on Coumadin,  HTN, hx of PE's on chronic coumad, CHF, and COPD- here for evaluation of R side low back  Pain with sciatica. Tramadol makes her ill as well as Tylenol #3 and Darvocet.    Has been drinking- beer- and bloody mary. Doesn't know how much.    Couldn't pee in little bottle. So peed on hand, so couldn't go on day of order last time.    Went up on Gabapentin- was not helpful.  Will try to switch to Lyrica.    Had epidural steroid injections in past- were not helpful.    Plan: 1. Wean gabapentin- so on 3600 mg/day.  Wean to 1200 mg 2x/day x 1 week then 600 mg 2x/day x 1 week then 600 mg nightly x 1 week then stop.   2. Lyrica- 1st week- 50 mg 3x/day for 1 week  2nd week- 100 mg 3x/day- for nerve pain  3. Trigger point injections Patient here for trigger point injections for  Consent done and on chart.  Cleaned areas with alcohol and injected using a 27 gauge 1.5 inch needle  Injected 3cc Using 1% Lidocaine with no EPI  Upper traps Levators Posterior scalenes Middle scalenes Splenius Capitus Pectoralis Major Rhomboids Infraspinatus Teres Major/minor Thoracic paraspinals- B/L Lumbar paraspinals on R down RLE  Other injections-    Patient's level of pain prior was- 7-8/10 Current level of pain after injections is- still 7-8/10- about the same-   There was no bleeding or complications.  Patient was advised to drink a lot of water on day after injections to flush system Will have increased soreness for 12-48 hours after injections.  Can use Lidocaine patches the day AFTER injections Can use theracane on day of injections in places didn't inject Can use heating pad 4-6 hours AFTER injections  4. F/U in 6 weeks.   I spent a total of 30 minutes - as detailed before.  Just went to son's birthday appointment- no drugs, but drank a lot. Just "enjoyed herself"- scared will be positive for alcohol- and  explained cannot prescribe if drinks more than 2 drinks/day= that means 2 beers 12 oz beers or 2 SMALL bloody marys

## 2019-12-04 NOTE — Patient Instructions (Signed)
1. Wean gabapentin- so on 3600 mg/day.  Wean to 1200 mg 2x/day x 1 week then 600 mg 2x/day x 1 week then 600 mg nightly x 1 week then stop.   2. Lyrica- 1st week- 50 mg 3x/day for 1 week  2nd week- 100 mg 3x/day- for nerve pain  3. Trigger point injections Patient here for trigger point injections for  Consent done and on chart.  Cleaned areas with alcohol and injected using a 27 gauge 1.5 inch needle  Injected 3cc Using 1% Lidocaine with no EPI  Upper traps Levators Posterior scalenes Middle scalenes Splenius Capitus Pectoralis Major Rhomboids Infraspinatus Teres Major/minor Thoracic paraspinals- B/L Lumbar paraspinals on R down RLE  Other injections-    Patient's level of pain prior was- 7-8/10 Current level of pain after injections is- still 7-8/10- about the same-   There was no bleeding or complications.  Patient was advised to drink a lot of water on day after injections to flush system Will have increased soreness for 12-48 hours after injections.  Can use Lidocaine patches the day AFTER injections Can use theracane on day of injections in places didn't inject Can use heating pad 4-6 hours AFTER injections  4. F/U in 6 weeks.   Just went to son's birthday appointment- no drugs, but drank a lot. Just "enjoyed herself"- scared will be positive for alcohol- and explained cannot prescribe if drinks more than 2 drinks/day= that means 2 beers 12 oz beers or 2 SMALL bloody marys

## 2019-12-08 NOTE — Progress Notes (Signed)
Internal Medicine Clinic Attending  CCM services provided by the care management provider and their documentation were discussed with Dr. Krienke. We reviewed the pertinent findings, urgent action items addressed by the resident and non-urgent items to be addressed by the PCP.  I agree with the assessment, diagnosis, and plan of care documented in the CCM and resident's note.  Alayssa Flinchum C Eligh Rybacki, DO 12/08/2019  

## 2019-12-18 DIAGNOSIS — I499 Cardiac arrhythmia, unspecified: Secondary | ICD-10-CM | POA: Diagnosis not present

## 2019-12-18 DIAGNOSIS — I420 Dilated cardiomyopathy: Secondary | ICD-10-CM | POA: Diagnosis not present

## 2019-12-18 DIAGNOSIS — I10 Essential (primary) hypertension: Secondary | ICD-10-CM | POA: Diagnosis not present

## 2019-12-18 DIAGNOSIS — Z136 Encounter for screening for cardiovascular disorders: Secondary | ICD-10-CM | POA: Diagnosis not present

## 2019-12-18 DIAGNOSIS — E7800 Pure hypercholesterolemia, unspecified: Secondary | ICD-10-CM | POA: Diagnosis not present

## 2019-12-18 DIAGNOSIS — Z1589 Genetic susceptibility to other disease: Secondary | ICD-10-CM | POA: Diagnosis not present

## 2019-12-18 DIAGNOSIS — Z148 Genetic carrier of other disease: Secondary | ICD-10-CM | POA: Diagnosis not present

## 2019-12-18 DIAGNOSIS — Z8249 Family history of ischemic heart disease and other diseases of the circulatory system: Secondary | ICD-10-CM | POA: Diagnosis not present

## 2019-12-26 ENCOUNTER — Telehealth: Payer: Medicare Other

## 2019-12-26 ENCOUNTER — Ambulatory Visit: Payer: Medicare Other | Admitting: *Deleted

## 2019-12-26 NOTE — Chronic Care Management (AMB) (Signed)
  Chronic Care Management   Note  12/26/2019 Name: Kim Anthony [name]: [id number] DOB: Jan 11, 1951   Reached patient via contact number to complete follow up assessment. She states she cannot talk as she is babysitting her 62 and 70 year old granddaughters. [place]16 so scheduled follow up assessment for 7/20.  Follow up plan: Telephone follow up appointment with care management team member scheduled for:7/20 at 11:30 am.  Kelli Churn RN, CCM, [facility] RN Care Manager [phone]

## 2019-12-27 ENCOUNTER — Other Ambulatory Visit: Payer: Self-pay | Admitting: Pharmacist

## 2019-12-29 ENCOUNTER — Telehealth: Payer: Self-pay | Admitting: Pharmacist

## 2019-12-29 NOTE — Telephone Encounter (Signed)
Authorized refll for warfarin after requesting an INR. INR by [facility] point [facility] care just now was 2.3 on 27.5mg  warfarin/wk. Goal 2.5 - 3.5. Increased to 30mg  warfarin/wk. Repeat on [id number]. No bleeding, no new medications. No symptoms of embolic disease per [facility], the daughter.

## 2020-01-01 NOTE — Telephone Encounter (Signed)
Sounds good. Thank you

## 2020-01-09 ENCOUNTER — Telehealth: Payer: Medicare Other

## 2020-01-10 ENCOUNTER — Ambulatory Visit: Payer: Medicare Other | Admitting: *Deleted

## 2020-01-10 DIAGNOSIS — E785 Hyperlipidemia, unspecified: Secondary | ICD-10-CM

## 2020-01-10 DIAGNOSIS — I10 Essential (primary) hypertension: Secondary | ICD-10-CM

## 2020-01-10 DIAGNOSIS — Z952 Presence of prosthetic heart valve: Secondary | ICD-10-CM

## 2020-01-10 DIAGNOSIS — J449 Chronic obstructive pulmonary disease, unspecified: Secondary | ICD-10-CM

## 2020-01-10 DIAGNOSIS — I5042 Chronic combined systolic (congestive) and diastolic (congestive) heart failure: Secondary | ICD-10-CM

## 2020-01-10 NOTE — Chronic Care Management (AMB) (Addendum)
Chronic Care Management   Follow Up Note   01/10/2020 Name: Kim Anthony [name]: [id number] DOB: [birth year]  Referred by: Kim Payment, MD Reason for referral : Chronic Care Management (COPD, Asthma, HTN)   Kim Anthony is a 69 y.o. year old female who is a primary care patient of Kim Payment, MD. The CCM team was consulted for assistance with chronic disease management and care coordination needs.    Review of patient status, including review of consultants reports, relevant laboratory and other test results, and collaboration with appropriate care team members and the patient's provider was performed as part of comprehensive patient evaluation and provision of chronic care management services.    SDOH (Social Determinants of Health) assessments performed: No See Care Plan activities for detailed interventions related to [facility])     Outpatient Encounter Medications as of 01/10/2020  Medication Sig Note  . albuterol (PROVENTIL) (2.5 MG/3ML) 0.083% nebulizer solution Take 3 mLs (2.5 mg total) by nebulization every 6 (six) hours as needed for wheezing or shortness of breath. 11/28/2019: Uses 3 times week during high pollen  . albuterol (VENTOLIN HFA) 108 (90 Base) MCG/ACT inhaler Inhale 1-2 puffs into the lungs every 6 (six) hours as needed for wheezing or shortness of breath. 11/28/2019: Uses twice daily  . Calcium-Phosphorus-Vitamin D (CITRACAL +D3 PO) Take 1 tablet by mouth 2 (two) times a day.   . fluticasone (FLONASE) 50 MCG/ACT nasal spray Place 2 sprays into both nostrils daily. (Patient taking differently: Place 1 spray into both nostrils daily as needed for allergies. )   . furosemide (LASIX) 40 MG tablet Take 1 tablet (40 mg total) by mouth daily.   [name] gabapentin (NEURONTIN) 600 MG tablet Take 1 tablet (600 mg total) by mouth 4 (four) times daily. X 4 days, then go to 1200 mg 3x/day. For nerve pain 11/28/2019: Patient states she takes medication, sometimes more than prescribed because the  current dose is ineffective in treating her back pain  . lisinopril (ZESTRIL) 20 MG tablet Take 1 tablet (20 mg total) by mouth daily.   . metoprolol succinate (TOPROL-XL) 25 MG 24 hr tablet Take 1 tablet (25 mg total) by mouth daily.   . potassium chloride SA (KLOR-CON) 20 MEQ tablet Take 2 tablets (40 mEq total) by mouth daily.   . pregabalin (LYRICA) 100 MG capsule Take 1 capsule (100 mg total) by mouth 3 (three) times daily.   . pregabalin (LYRICA) 50 MG capsule Take 1 capsule (50 mg total) by mouth 3 (three) times daily.   . simvastatin (ZOCOR) 20 MG tablet Take 1 tablet by mouth once daily   . Tiotropium Bromide-Olodaterol (STIOLTO RESPIMAT) 2.5-2.5 MCG/ACT AERS Inhale 2 puffs into the lungs daily. Please order spacer to use with Stiolto  . warfarin (COUMADIN) 5 MG tablet TAKE 1/2 TABLET BY MOUTH ON THURSDAYS, SATURDAYS, AND MONDAYS. ON ALL OTHER DAYS TAKE ONE TABLET.    No facility-administered encounter medications on file as of 01/10/2020.     Objective:  BP Readings from Last 3 Encounters:  12/04/19 (!) 172/85  11/01/19 (!) 175/94  10/18/19 126/80   Lab Results  Component Value Date   CHOL 177 04/14/2018   HDL 62 04/14/2018   LDLCALC 96 04/14/2018   TRIG 97 04/14/2018   CHOLHDL 2.9 04/14/2018   On statin therapy- please consider updating lipid panel  Goals Addressed              This Visit's Progress     Patient Stated   .  "  I'm not on a long acting inhaler but I use to be." (pt-stated)        CARE PLAN ENTRY (see longitudinal plan of care for additional care plan information)  Current Barriers:  . Chronic Disease Management support, education, and care coordination needs related to CHF, CAD, HTN, HLD, COPD, and Asthma - patient says she is having difficulty using the Stiolto inhaler and says she doesn't feel like she gets enough of the medicine in her lungs so she says she is using her rescue inhaler and nebulizer treatments more often, she reports she does not  use a spacer with her MDI but is willing to try one. She says  the Remote Health Nurse, who sees her monthly, gave her a BP monitor but she has not checked her blood pressure because she says the Remote Health nurse checks her BP monthly and it is always good. she reports good medication taking behavior.  Clinical Goal(s) related to CHF, CAD, HTN, HLD, COPD, and asthma :  Over the next 30 - 60 days, patient will:  . Work with the care management team to address educational, disease management, and care coordination needs  . Begin or continue self health monitoring activities as directed today Measure and record blood pressure 1-2 times per week and as needed  when you are able to get a home BP monitor . Call provider office for new or worsened signs and symptoms of CHF, HTN, HLD, COPD, and Asthma . Call care management team with questions or concerns . Verbalize basic understanding of patient centered plan of care established today  Interventions related to CHF, CAD, HTN, HLD, COPD, and Asthma :  . Evaluation of current treatment plans and patient's adherence to plan as established by provider . Completed medication reconciliation- will message provider requesting spacer to use with Stiolto . Assessed patient understanding of disease states . Reviewed the importance INR monitoring while on Coumadin and assessed home monitoring frequency . Assessed patient's education and care coordination needs . Collaborated with appropriate clinical care team members regarding patient needs   Patient Self Care Activities related to CHF, HTN, HLD, COPD, and Asthma :  . Patient is unable to independently self-manage chronic health conditions  Please see past updates related to this goal by clicking on the "Past Updates" button in the selected goal          Plan:   The care management team will reach out to the patient again over the next 30-60 days.    Kim Churn RN, CCM, [facility] RN Care  Manager [phone]

## 2020-01-10 NOTE — Patient Instructions (Signed)
Visit Information It was nice speaking with you. I will ask you provider to order a spacer for you to use with your Stiolto.  Goals Addressed              This Visit's Progress     Patient Stated   .  "I'm not on a long acting inhaler but I use to be." (pt-stated)        CARE PLAN ENTRY (see longitudinal plan of care for additional care plan information)  Current Barriers:  . Chronic Disease Management support, education, and care coordination needs related to CHF, CAD, HTN, HLD, COPD, and Asthma - patient says she is having difficulty using the Stiolto inhaler and says she doesn't feel like she gets enough of the medicine in her lungs so she says she is using her rescue inhaler and nebulizer treatments, more often she reports she does not use a spacer with her MDI but is willing to try one. She says she the Remote Health Nurse, who sees her monthly, gave her a BP monitor but she has not checked her blood pressure because she says the Remote Health nurse checks her BP monthly and it is always good. she reports good medication taking behavior.  Clinical Goal(s) related to CHF, CAD, HTN, HLD, COPD, and asthma :  Over the next 30 - 60 days, patient will:  . Work with the care management team to address educational, disease management, and care coordination needs  . Begin or continue self health monitoring activities as directed today Measure and record blood pressure 1-2 times per week and as needed  when you are able to get a home BP monitor . Call provider office for new or worsened signs and symptoms of CHF, HTN, HLD, COPD, and Asthma . Call care management team with questions or concerns . Verbalize basic understanding of patient centered plan of care established today  Interventions related to CHF, CAD, HTN, HLD, COPD, and Asthma :  . Evaluation of current treatment plans and patient's adherence to plan as established by provider . Completed medication reconciliation- will message  provider requesting spacer to use with Stiolto . Assessed patient understanding of disease states . Reviewed the importance INR monitoring while on Coumadin and assessed home monitoring frequency . Assessed patient's education and care coordination needs . Collaborated with appropriate clinical care team members regarding patient needs   Patient Self Care Activities related to CHF, HTN, HLD, COPD, and Asthma :  . Patient is unable to independently self-manage chronic health conditions  Please see past updates related to this goal by clicking on the "Past Updates" button in the selected goal         The patient verbalized understanding of instructions provided today and declined a print copy of patient instruction materials.   The care management team will reach out to the patient again over the next 30-60 days.   Kelli Churn RN, CCM, [facility] RN Care Manager [phone]

## 2020-01-11 ENCOUNTER — Other Ambulatory Visit: Payer: Self-pay | Admitting: Internal Medicine

## 2020-01-11 DIAGNOSIS — I5042 Chronic combined systolic (congestive) and diastolic (congestive) heart failure: Secondary | ICD-10-CM

## 2020-01-11 DIAGNOSIS — I10 Essential (primary) hypertension: Secondary | ICD-10-CM

## 2020-01-11 MED ORDER — BREATHERITE COLL SPACER ADULT MISC: 1.0000 | Freq: Every day | 0 refills | Status: DC

## 2020-01-11 NOTE — Addendum Note (Signed)
Addended by: Modena Nunnery D on: 01/11/2020 03:46 PM   Modules accepted: Orders

## 2020-01-11 NOTE — Progress Notes (Signed)
Internal Medicine Clinic Resident  I have personally reviewed this encounter including the documentation in this note and/or discussed this patient with the care management provider. I will address any urgent items identified by the care management provider and will communicate my actions to the patient's PCP. I have reviewed the patient's CCM visit with my supervising attending, Dr Dareen Piano.  Delice Bison, DO 01/11/2020

## 2020-01-12 NOTE — Progress Notes (Signed)
Internal Medicine Clinic Attending  CCM services provided by the care management provider and their documentation were discussed with Dr. Koleen Distance. We reviewed the pertinent findings, urgent action items addressed by the resident and non-urgent items to be addressed by the PCP.  I agree with the assessment, diagnosis, and plan of care documented in the CCM and resident's note.  Aldine Contes, MD 01/12/2020

## 2020-01-17 ENCOUNTER — Encounter
Payer: Medicare Other | Attending: Physical Medicine and Rehabilitation | Admitting: Physical Medicine and Rehabilitation

## 2020-01-17 ENCOUNTER — Other Ambulatory Visit: Payer: Self-pay

## 2020-01-17 ENCOUNTER — Encounter: Payer: Self-pay | Admitting: Physical Medicine and Rehabilitation

## 2020-01-17 VITALS — BP 164/99 | HR 75 | Temp 98.8°F | Ht 65.0 in | Wt 181.0 lb

## 2020-01-17 DIAGNOSIS — Z5181 Encounter for therapeutic drug level monitoring: Secondary | ICD-10-CM | POA: Diagnosis not present

## 2020-01-17 DIAGNOSIS — M5441 Lumbago with sciatica, right side: Secondary | ICD-10-CM

## 2020-01-17 DIAGNOSIS — Z79891 Long term (current) use of opiate analgesic: Secondary | ICD-10-CM | POA: Diagnosis not present

## 2020-01-17 DIAGNOSIS — G894 Chronic pain syndrome: Secondary | ICD-10-CM

## 2020-01-17 DIAGNOSIS — G8929 Other chronic pain: Secondary | ICD-10-CM | POA: Diagnosis not present

## 2020-01-17 MED ORDER — PREGABALIN 100 MG PO CAPS: 100.0000 mg | ORAL_CAPSULE | Freq: Three times a day (TID) | ORAL | 3 refills | Status: DC

## 2020-01-17 NOTE — Progress Notes (Signed)
Subjective:    Patient ID: Kim Anthony, female    DOB: 1951/02/19, 69 y.o.   [name]: [id number]  HPI  Patient is a 69 yr old female with aortic stenosis s/p AVR on Coumadin, HTN, hx of PE's on chronic coumadin , CHF, and COPD- here for f/u of R side low back Pain with sciatica. Tramadol makes her ill as well as Tylenol #3 and Darvocet.   Was sore for 2 days after trigger point injections- than went back to baseline- didn't get better.    No side effects from Lyrica- was taking 50 mg 3x/day- only got a few days- sounds like got 1 week, but didn't fill the 2nd Rx for 100 mg 3x/day.  So was on for 1 week, but not since then.   Stayed on gabapentin-  Also c/o gaining weight-  Lost 3 lbs, but never been this big.   Water and cranberry juice for 5 days- no other drinks for 5 days.   Have had epidural injection x1- in past- didn't feel anything.  Still had pain.   Pain Inventory Average Pain 8 Pain Right Now 8 My pain is aching  In the last 24 hours, has pain interfered with the following? General activity 7 Relation with others 7 Enjoyment of life 7 What TIME of day is your pain at its worst? daytime Sleep (in general) Fair  Pain is worse with: walking Pain improves with: medication Relief from Meds: 2  Mobility walk without assistance  Function disabled: date disabled .  Neuro/Psych trouble walking spasms  Prior Studies Any changes since last visit?  no  Physicians involved in your care Any changes since last visit?  no   Family History  Problem Relation Age of Onset  . Heart attack Other   . Breast cancer Maternal Aunt 29   Social History   Socioeconomic History  . Marital status: Single    Spouse name: Not on file  . Number of children: Not on file  . Years of education: Not on file  . Highest education level: Not on file  Occupational History  . Not on file  Tobacco Use  . Smoking status: Current Every Day Smoker    Packs/day: 0.25    Years:  30.00    Pack years: 7.50    Types: Cigarettes  . Smokeless tobacco: Never Used  . Tobacco comment: Sometimes less.  Vaping Use  . Vaping Use: Never used  Substance and Sexual Activity  . Alcohol use: Yes    Comment: beer- ocasional  . Drug use: No  . Sexual activity: Not Currently    Birth control/protection: None  Other Topics Concern  . Not on file  Social History Narrative   ** Merged History Encounter **       Social Determinants of Health   Financial Resource Strain: Low Risk   . Difficulty of Paying Living Expenses: Not hard at all  Food Insecurity: No Food Insecurity  . Worried About Charity fundraiser in the Last Year: Never true  . Ran Out of Food in the Last Year: Never true  Transportation Needs: No Transportation Needs  . Lack of Transportation (Medical): No  . Lack of Transportation (Non-Medical): No  Physical Activity:   . Days of Exercise per Week:   . Minutes of Exercise per Session:   Stress:   . Feeling of Stress :   Social Connections: Socially Isolated  . Frequency of Communication with Friends and Family: More than  three times a week  . Frequency of Social Gatherings with Friends and Family: More than three times a week  . Attends Religious Services: Never  . Active Member of Clubs or Organizations: No  . Attends Archivist Meetings: Not on file  . Marital Status: Never married   Past Surgical History:  Procedure Laterality Date  . APPLICATION OF A-CELL OF CHEST/ABDOMEN Left 01/27/2019   Procedure: APPLICATION OF A-CELL OF CHEST;  Surgeon: Wallace Going, DO;  Location: [place];  Service: Plastics;  Laterality: Left;  . APPLICATION OF WOUND VAC N/A 01/27/2019   Procedure: APPLICATION OF WOUND VAC;  Surgeon: Wallace Going, DO;  Location: [place];  Service: Plastics;  Laterality: N/A;  . COLONOSCOPY WITH PROPOFOL N/A 06/07/2018   Procedure: COLONOSCOPY WITH PROPOFOL;  Surgeon: Wilford Corner, MD;  Location: [facility];   Service: Endoscopy;  Laterality: N/A;  . DEBRIDEMENT AND CLOSURE WOUND Left 12/28/2018   Procedure: Debridement And Closure Wound;  Surgeon: Coralie Keens, MD;  Location: [place];  Service: General;  Laterality: Left;  . INCISION AND DRAINAGE OF WOUND Left 01/27/2019   Procedure: IRRIGATION AND DEBRIDEMENT OF CHEST WALL;  Surgeon: Wallace Going, DO;  Location: [place];  Service: Plastics;  Laterality: Left;  . LATISSIMUS FLAP TO BREAST Left 01/18/2019   Procedure: LEFT LATISSIMUS FLAP TO BREAST;  Surgeon: Wallace Going, DO;  Location: [place];  Service: Plastics;  Laterality: Left;  [name] MASTECTOMY Left 2000s   for breast cancer, also s/p radiation and chemo  . MITRAL VALVE REPLACEMENT     mechanical  . POLYPECTOMY  06/07/2018   Procedure: POLYPECTOMY;  Surgeon: Wilford Corner, MD;  Location: [facility];  Service: Endoscopy;;  . WOUND DEBRIDEMENT Left 12/28/2018   Procedure: EXCISION OF LEFT CHRONIC CHEST WALL WOUND;  Surgeon: Coralie Keens, MD;  Location: [place];  Service: General;  Laterality: Left;   Past Medical History:  Diagnosis Date  . Arthritis   . Asthma   . Breast cancer ([place]) 2001   Left Breast Cancer  . CHF (congestive heart failure) ([place])   . COPD (chronic obstructive pulmonary disease) ([place])   . Coronary artery disease   . History of mitral valve replacement with mechanical valve   . HLD (hyperlipidemia)   . Hypertension   . Pulmonary embolism ([facility])    BP (!) 164/99   Pulse 75   Temp 98.8 F (37.1 C)   Ht 5\' 5"  (1.651 m)   Wt 181 lb (82.1 kg)   SpO2 94%   BMI 30.12 kg/m   Opioid Risk Score:   Fall Risk Score:  `1  Depression screen PHQ 2/9  Depression screen [facility] 2/9 11/01/2019 09/14/2019 08/16/2019 03/09/2019 04/28/2018 04/21/2018 03/24/2018  Decreased Interest 0 0 0 0 0 0 0  Down, Depressed, Hopeless 0 0 0 0 0 0 0  PHQ - 2 Score 0 0 0 0 0 0 0  Altered sleeping 3 1 0 - - - -  Tired, decreased energy 1 1 0 - - - -  Change in appetite 1 0 0 - - - -    Feeling bad or failure about yourself  0 0 0 - - - -  Trouble concentrating 0 0 0 - - - -  Moving slowly or fidgety/restless 0 0 0 - - - -  Suicidal thoughts 0 0 0 - - - -  PHQ-9 Score 5 2 0 - - - -  Difficult doing work/chores Somewhat difficult  Not difficult at all Not difficult at all - - - -  Some recent data might be hidden    Review of Systems  Constitutional: Negative.   HENT: Negative.   Eyes: Negative.   Respiratory: Negative.   Cardiovascular: Negative.   Gastrointestinal: Negative.   Endocrine: Negative.   Genitourinary: Negative.   Musculoskeletal: Positive for back pain.       Spasms   Skin: Negative.   Allergic/Immunologic: Negative.   Neurological: Negative.   Hematological: Negative.   Psychiatric/Behavioral: Negative.   All other systems reviewed and are negative.      Objective:   Physical Exam  Awake, alert, appropriate smiling some, sitting on table, NAD Knot in R low back- sticking out more per pt- is more prominent TTP over R lumbar spine- paraspinals      Assessment & Plan:   Patient is a 69 yr old female with aortic stenosis s/p AVR on Coumadin, HTN, hx of PE's on chronic coumadin , CHF, and COPD- here for f/u of R side low back Pain with sciatica. Tramadol makes her ill as well as Tylenol #3 and Darvocet.  1. Will renew/refill Lyrica 100 mg 3x/day for nerve pain- 50 mg -rarely effective at 50 mg- usually needs higher dose to work. So will send Rx to pharmacy.   2. Gabapentin can make you gain weight and at max dose.   3. Wait 2 weeks on Lyrica to start weaning Gabapentin so we know Lyrica is working before coming off gabapentin.     4.  Wean gabapentin- so on 3600 mg/day.  Wean to 1200 mg 2x/day x 1 week then 600 mg 2x/day x 1 week then 600 mg nightly x 1 week then stop.   5. UDS today- was good per pt. If OK, can prescribe some Norco. When UDS comes back OK- please have person covering me prescribe Norco- first Rx is 7 days- then  30 days after that- please prescribe 5/325 mg 3x/day as needed for pain.    6. Will wait on trigger point injections today, but think about it at next visit.   7. Hx of epidural steroid injection x1- didn't work.  Scared to get MRI due to claustrophobia.    8. F/U in 6 weeks.  I spent a total of 30 minutes on appointment- as detailed above.

## 2020-01-17 NOTE — Patient Instructions (Signed)
Patient is a 69 yr old female with aortic stenosis s/p AVR on Coumadin, HTN, hx of PE's on chronic coumadin , CHF, and COPD- here for f/u of R side low back Pain with sciatica. Tramadol makes her ill as well as Tylenol #3 and Darvocet.  1. Will renew/refill Lyrica 100 mg 3x/day for nerve pain- 50 mg -rarely effective at 50 mg- usually needs higher dose to work. So will send Rx to pharmacy.   2. Gabapentin can make you gain weight and at max dose.   3. Wait 2 weeks on Lyrica to start weaning Gabapentin so we know Lyrica is working before coming off gabapentin.     4.  Wean gabapentin- so on 3600 mg/day.  Wean to 1200 mg 2x/day x 1 week then 600 mg 2x/day x 1 week then 600 mg nightly x 1 week then stop.   5. UDS today- was good per pt. If OK, can prescribe some Norco. When UDS comes back OK- please have person covering me prescribe Norco- first Rx is 7 days- then 30 days after that- please prescribe 5/325 mg 3x/day as needed for pain.    6. Will wait on trigger point injections today, but think about it at next visit.   7. Hx of epidural steroid injection x1- didn't work.  Scared to get MRI due to claustrophobia.    8. F/U in 6 weeks.

## 2020-01-20 LAB — TOXASSURE SELECT,+ANTIDEPR,UR

## 2020-01-22 ENCOUNTER — Other Ambulatory Visit: Payer: Self-pay | Admitting: Internal Medicine

## 2020-01-22 ENCOUNTER — Other Ambulatory Visit (INDEPENDENT_AMBULATORY_CARE_PROVIDER_SITE_OTHER): Payer: Medicare Other

## 2020-01-22 ENCOUNTER — Other Ambulatory Visit: Payer: Self-pay

## 2020-01-22 ENCOUNTER — Telehealth: Payer: Self-pay | Admitting: Internal Medicine

## 2020-01-22 ENCOUNTER — Ambulatory Visit: Payer: Medicare Other

## 2020-01-22 DIAGNOSIS — Z7901 Long term (current) use of anticoagulants: Secondary | ICD-10-CM

## 2020-01-22 LAB — POCT INR: INR: 1.7 — AB (ref 2.0–3.0)

## 2020-01-22 MED ORDER — WARFARIN SODIUM 5 MG PO TABS: 5.0000 mg | ORAL_TABLET | Freq: Every day | ORAL | 0 refills | Status: DC

## 2020-01-22 NOTE — Telephone Encounter (Signed)
Called and discussed plan with Kim Anthony.  She agreed to plan and to follow up in 1 week.  Would like an 830 appointment in coumadin clinic next week.   Gilles Chiquito, MD

## 2020-01-22 NOTE — Progress Notes (Signed)
INR 1.7.  Will advised patient to increase coumadin to 1 tablet (5mg ) per month and return in 1 week for INR check.

## 2020-01-22 NOTE — Telephone Encounter (Signed)
Kim Anthony presented for coumadin visit.  Clinic was cancelled for today and she did not get message.  Her INR was 1.7.  Goal of 2.5-3.5.   Her coumadin will be increased to 1 tablet (5mg ) daily and she should return in 1 week for INR check.   Gilles Chiquito, MD

## 2020-01-23 ENCOUNTER — Telehealth: Payer: Self-pay | Admitting: *Deleted

## 2020-01-23 MED ORDER — HYDROCODONE-ACETAMINOPHEN 5-325 MG PO TABS: 1.0000 | ORAL_TABLET | Freq: Three times a day (TID) | ORAL | 0 refills | Status: DC | PRN

## 2020-01-23 NOTE — Telephone Encounter (Signed)
Patient contacted our office asking for UDS results.  The results appear to be normal. Please review. 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 She is out on PAL this week.   Her last clinic note states:  "5. UDS today- was good per pt. If OK, can prescribe some Norco. When UDS comes back OK- please have person covering me prescribe Norco- first Rx is 7 days- then 30 days after that- please prescribe 5/325 mg 3x/day as needed for pain."

## 2020-01-23 NOTE — Telephone Encounter (Signed)
PMP was reviewed. Dr. Dagoberto Ligas note was reviewed. UDS: Consistent. Hydrocodone e-scribed.  Placed a call to Ms. Riddle regarding the above, she verbalizes understanding.

## 2020-01-29 ENCOUNTER — Telehealth: Payer: Self-pay | Admitting: *Deleted

## 2020-01-29 ENCOUNTER — Ambulatory Visit: Payer: Medicare Other

## 2020-01-29 ENCOUNTER — Other Ambulatory Visit: Payer: Medicare Other

## 2020-01-29 MED ORDER — HYDROCODONE-ACETAMINOPHEN 5-325 MG PO TABS: 1.0000 | ORAL_TABLET | Freq: Three times a day (TID) | ORAL | 0 refills | Status: DC | PRN

## 2020-01-29 NOTE — Telephone Encounter (Signed)
Renewed Norco 5/325 mg TID prn #90- Rx- 1 month supply

## 2020-01-29 NOTE — Telephone Encounter (Signed)
Kim Anthony called and is needing a refill on her hydrocodone.

## 2020-01-30 ENCOUNTER — Ambulatory Visit: Payer: Medicare Other | Admitting: *Deleted

## 2020-01-30 ENCOUNTER — Other Ambulatory Visit: Payer: Self-pay | Admitting: Internal Medicine

## 2020-01-30 DIAGNOSIS — J449 Chronic obstructive pulmonary disease, unspecified: Secondary | ICD-10-CM

## 2020-01-30 DIAGNOSIS — Z952 Presence of prosthetic heart valve: Secondary | ICD-10-CM

## 2020-01-30 DIAGNOSIS — I10 Essential (primary) hypertension: Secondary | ICD-10-CM

## 2020-01-30 DIAGNOSIS — G8929 Other chronic pain: Secondary | ICD-10-CM

## 2020-01-30 NOTE — Telephone Encounter (Signed)
Notified. 

## 2020-01-30 NOTE — Chronic Care Management (AMB) (Signed)
  Chronic Care Management   Note  01/30/2020 Name: Kim Anthony [name]: [id number] DOB: Mar 30, 1951   Successful outreach to patient to complete follow up assessment. 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  Follow up plan: Telephone follow up appointment with care management team member scheduled for:01/31/20 at 9:00 am  Kelli Churn RN, CCM, [facility] RN Care Manager [phone]

## 2020-01-31 ENCOUNTER — Telehealth: Payer: Self-pay | Admitting: *Deleted

## 2020-01-31 ENCOUNTER — Telehealth: Payer: Medicare Other

## 2020-01-31 NOTE — Telephone Encounter (Addendum)
  Chronic Care Management   Outreach Note  01/31/2020 Name: Kim Anthony [name]: [id number] DOB: [birth year]  Referred by: Marianna Payment, MD Reason for referral : Chronic Care Management (COPD, HTN, asthma, s/p MVR)   An unsuccessful telephone outreach was attempted today. The patient was referred to the case management team for assistance with care management and care coordination. Unable to leave a message as recording states voice mailbox has not been set up yet.  Follow Up Plan: The care management team will reach out to the patient again over the next 7-10 days.   Kelli Churn RN, CCM, [facility] RN Care Manager [phone]

## 2020-01-31 NOTE — Telephone Encounter (Signed)
  Chronic Care Management   Note  01/31/2020 Name: Eudelia Hiltunen [name]: [id number] DOB: [birth year]  Encounter opened in error- duplicate. See other note of today.   Kelli Churn RN, CCM, [facility] RN Care Manager [phone]

## 2020-02-01 ENCOUNTER — Telehealth: Payer: Self-pay

## 2020-02-01 NOTE — Telephone Encounter (Signed)
Returned call to patient. States she is out of lasix as she has been [facility] 2/2 heat and started doubling lasix x 2 weeks. Explained need for appt to address [facility]. Patient requested appt next week. Scheduled 02/06/2020 at [place] with Red Team. She is completely out of lasix. Explained Rx sent for 30 tabs on 01/30/2020. She will contact Pharmacy. Patient was able to speak in full sentences without stopping. No SHOB or distress noted during phone call. Hubbard Hartshorn, BSN, RN-BC

## 2020-02-01 NOTE — Telephone Encounter (Signed)
Requesting to speak with a nurse about  furosemide (LASIX) 40 MG tablet. Please call pt back.  

## 2020-02-05 NOTE — Progress Notes (Deleted)
   CC: ***  HPI:  [name] is a 69 y.o.   Past Medical History:  Diagnosis Date  . Arthritis   . Asthma   . Breast cancer ([place]) 2001   Left Breast Cancer  . CHF (congestive heart failure) ([place])   . COPD (chronic obstructive pulmonary disease) ([place])   . Coronary artery disease   . History of mitral valve replacement with mechanical valve   . HLD (hyperlipidemia)   . Hypertension   . Pulmonary embolism ([place])    Review of Systems:  ***  Physical Exam:  There were no vitals filed for this visit. ***  Assessment & Plan:   See Encounters Tab for problem based charting.  Patient {GC/GE:[id number]::"discussed with","seen with"} Dr. {NAMES:[id number]::"Butcher","Guilloud","Hoffman","Mullen","Narendra","Raines","Vincent"}

## 2020-02-06 ENCOUNTER — Encounter: Payer: Medicare Other | Admitting: Internal Medicine

## 2020-02-07 ENCOUNTER — Ambulatory Visit: Payer: Medicare Other | Admitting: *Deleted

## 2020-02-07 DIAGNOSIS — I5042 Chronic combined systolic (congestive) and diastolic (congestive) heart failure: Secondary | ICD-10-CM

## 2020-02-07 DIAGNOSIS — I10 Essential (primary) hypertension: Secondary | ICD-10-CM

## 2020-02-07 DIAGNOSIS — J449 Chronic obstructive pulmonary disease, unspecified: Secondary | ICD-10-CM

## 2020-02-07 NOTE — Patient Instructions (Signed)
Visit Information It was nice speaking with you today. Please keep your clinic appointment on 02/09/20  Goals Addressed              This Visit's Progress     Patient Stated   .  "I'm not on a long acting inhaler but I use to be." (pt-stated)        CARE PLAN ENTRY (see longitudinal plan of care for additional care plan information)  Current Barriers:  . Chronic Disease Management support, education, and care coordination needs related to CHF, CAD, HTN, HLD, COPD, and Asthma -  patient says she is having difficulty breathing and so she doubled her Lasix dose and that has helped, she says she has a clinic appointment on 8/20, she says she does not use her home INR machine because she is out of lancets, she reports when using the Stiolto inhaler she doesn't feel like she gets enough of the medicine in her lungs which causes her to use her rescue inhaler and nebulizer treatments more often. She reports she does not use a spacer with her MDI but is willing to try one. She says the Remote Health Nurse, who sees her monthly, gave her a BP monitor but she has not checked her blood pressure because she says the Remote [facility] nurse checks her BP monthly and it is always good. she reports good medication taking behavior.  Clinical Goal(s) related to CHF, CAD, HTN, HLD, COPD, and asthma :  Over the next 30 - 60 days, patient will:  . Work with the care management team to address educational, disease management, and care coordination needs  . Begin or continue self health monitoring activities as directed today Measure and record blood pressure 1-2 times per week and as needed  when you are able to get a home BP monitor . Call provider office for new or worsened signs and symptoms of CHF, HTN, HLD, COPD, and Asthma . Call care management team with questions or concerns . Verbalize basic understanding of patient centered plan of care established today  Interventions related to CHF, CAD, HTN, HLD, COPD,  and Asthma :  . Evaluation of current treatment plans and patient's adherence to plan as established by provider . Reviewed the importance INR monitoring while on Coumadin and assessed home monitoring frequency- will obtain single use lancets for patient to pick up on Friday when she at the clinic . Assessed patient's education and care coordination needs . Collaborated with appropriate clinical care team members regarding patient needs . Reviewed date and time for clinic appointment on 8/20 at 10:15 am and verified she has transportation to the appointment    Patient Self Care Activities related to CHF, HTN, HLD, COPD, and Asthma :  . Patient is unable to independently self-manage chronic health conditions  Please see past updates related to this goal by clicking on the "Past Updates" button in the selected goal         The patient verbalized understanding of instructions provided today and declined a print copy of patient instruction materials.   The care management team will reach out to the patient again over the next 7-14 days.   Kelli Churn RN, CCM, [facility] RN Care Manager [phone]

## 2020-02-07 NOTE — Chronic Care Management (AMB) (Addendum)
Care Management   Follow Up Note   02/07/2020 Name: Kim Anthony [name]: [id number] DOB: 1951-03-17  Referred by: Kim Payment, MD Reason for referral : Chronic Care Management (COPD, HF, HTN)   [name] is a 69 y.o. year old female who is a primary care patient of Kim Payment, MD. The care management team was consulted for assistance with care management and care coordination needs.    Review of patient status, including review of consultants reports, relevant laboratory and other test results, and collaboration with appropriate care team members and the patient's provider was performed as part of comprehensive patient evaluation and provision of chronic care management services.    SDOH (Social Determinants of Health) assessments performed: No See Care Plan activities for detailed interventions related to [facility]     Advanced Directives: See Care Plan and Vyn[employer] application for related entries.   Goals Addressed              This Visit's Progress     Patient Stated   .  "I'm not on a long acting inhaler but I use to be." (pt-stated)        CARE PLAN ENTRY (see longitudinal plan of care for additional care plan information)  Current Barriers:  Chronic Disease Management support, education, and care coordination needs related to CHF, CAD, HTN, HLD, COPD, and Asthma - patient says she is having difficulty breathing and so she doubled her Lasix dose and that has helped, she says she has a clinic appointment on 8/20, she says she does not use her home INR machine because she is out of lancets, she reports when using the Stiolto inhaler she doesn't feel like she gets enough of the medicine in her lungs,  even when using the spacer, which causes her to use her rescue inhaler and nebulizer treatments more often. She denies selling in her legs/feet or abdomen although she states she has gained weight and now weighs 180 lbs. She is questioning if her weight gain is due to gabapentin. She  says the Remote Health Nurse, who sees her monthly, gave her a BP monitor but she has not checked her blood pressure because she says the Remote Health nurse checks her BP monthly and it is always good. she reports good medication taking behavior.  Clinical Goal(s) related to CHF, CAD, HTN, HLD, COPD, and asthma :  Over the next 30 - 60 days, patient will:  . Work with the care management team to address educational, disease management, and care coordination needs  . Begin or continue self health monitoring activities as directed today Measure and record blood pressure 1-2 times per week and as needed  when you are able to get a home BP monitor . Call provider office for new or worsened signs and symptoms of CHF, HTN, HLD, COPD, and Asthma . Call care management team with questions or concerns . Verbalize basic understanding of patient centered plan of care established today  Interventions related to CHF, CAD, HTN, HLD, COPD, and Asthma :  . Evaluation of current treatment plans and patient's adherence to plan as established by provider . Reviewed the importance INR monitoring while on Coumadin and assessed home monitoring frequency- will obtain single use lancets for patient to pick up on Friday when she is at the clinic so she can resume home INR monitoring . Assessed patient's education and care coordination needs . Collaborated with appropriate clinical care team members regarding patient needs . Reviewed date and time for  clinic appointment on 8/20 at 10:15 am and verified she has transportation to the appointment    Patient Self Care Activities related to CHF, HTN, HLD, COPD, and Asthma :  . Patient is unable to independently self-manage chronic health conditions  Please see past updates related to this goal by clicking on the "Past Updates" button in the selected goal          The care management team will reach out to the patient again over the next 7-14 days.   Kelli Churn RN,  CCM, [facility] RN Care Manager [phone]

## 2020-02-08 ENCOUNTER — Telehealth: Payer: Medicare Other

## 2020-02-08 NOTE — Progress Notes (Signed)
Internal Medicine Clinic Resident  I have personally reviewed this encounter including the documentation in this note and/or discussed this patient with the care management provider. I will address any urgent items identified by the care management provider and will communicate my actions to the patient's PCP. I have reviewed the patient's CCM visit with my supervising attending, Dr Guilloud.  Kim Lillo Y Autrey Human, MD 02/08/2020    

## 2020-02-09 ENCOUNTER — Telehealth: Payer: Medicare Other

## 2020-02-09 ENCOUNTER — Encounter: Payer: Self-pay | Admitting: Internal Medicine

## 2020-02-09 ENCOUNTER — Ambulatory Visit (HOSPITAL_COMMUNITY)
Admission: RE | Admit: 2020-02-09 | Discharge: 2020-02-09 | Disposition: A | Discharge status: Home / Self Care | Payer: Medicare Other | Source: Ambulatory Visit | Attending: Internal Medicine | Admitting: Internal Medicine

## 2020-02-09 ENCOUNTER — Ambulatory Visit (INDEPENDENT_AMBULATORY_CARE_PROVIDER_SITE_OTHER): Payer: Medicare Other | Admitting: Internal Medicine

## 2020-02-09 ENCOUNTER — Other Ambulatory Visit: Payer: Self-pay

## 2020-02-09 VITALS — BP 158/92 | HR 73 | Temp 98.3°F | Ht 65.0 in | Wt 180.1 lb

## 2020-02-09 DIAGNOSIS — R0602 Shortness of breath: Secondary | ICD-10-CM

## 2020-02-09 DIAGNOSIS — I5042 Chronic combined systolic (congestive) and diastolic (congestive) heart failure: Secondary | ICD-10-CM

## 2020-02-09 DIAGNOSIS — I110 Hypertensive heart disease with heart failure: Secondary | ICD-10-CM

## 2020-02-09 DIAGNOSIS — I517 Cardiomegaly: Secondary | ICD-10-CM | POA: Diagnosis not present

## 2020-02-09 LAB — TROPONIN I (HIGH SENSITIVITY): Troponin I (High Sensitivity): 14 ng/L (ref <18)

## 2020-02-09 LAB — CBC
HCT: 41.7 % (ref 36.0–46.0)
Hemoglobin: 13.4 g/dL (ref 12.0–15.0)
MCH: 31.5 pg (ref 26.0–34.0)
MCHC: 32.1 g/dL (ref 30.0–36.0)
MCV: 97.9 fL (ref 80.0–100.0)
Platelets: 347 10*3/uL (ref 150–400)
RBC: 4.26 MIL/uL (ref 3.87–5.11)
RDW: 11.9 % (ref 11.5–15.5)
WBC: 5.9 10*3/uL (ref 4.0–10.5)
nRBC: 0 % (ref 0.0–0.2)

## 2020-02-09 LAB — BASIC METABOLIC PANEL
Anion gap: 13 (ref 5–15)
BUN: 12 mg/dL (ref 8–23)
CO2: 23 mmol/L (ref 22–32)
Calcium: 9 mg/dL (ref 8.9–10.3)
Chloride: 104 mmol/L (ref 98–111)
Creatinine, Ser: 0.72 mg/dL (ref 0.44–1.00)
GFR calc Af Amer: >60 mL/min (ref >60)
GFR calc non Af Amer: >60 mL/min (ref >60)
Glucose, Bld: 99 mg/dL (ref 70–99)
Potassium: 3.3 mmol/L — ABNORMAL LOW (ref 3.5–5.1)
Sodium: 140 mmol/L (ref 135–145)

## 2020-02-09 LAB — BRAIN NATRIURETIC PEPTIDE: B Natriuretic Peptide: 131.5 pg/mL — ABNORMAL HIGH (ref 0.0–100.0)

## 2020-02-09 MED ORDER — FUROSEMIDE 40 MG PO TABS: 40.0000 mg | ORAL_TABLET | Freq: Two times a day (BID) | ORAL | 1 refills | Status: DC

## 2020-02-09 NOTE — Progress Notes (Signed)
Internal Medicine Clinic Attending  CCM services provided by the care management provider and their documentation were discussed with Dr. Chen. We reviewed the pertinent findings, urgent action items addressed by the resident and non-urgent items to be addressed by the PCP.  I agree with the assessment, diagnosis, and plan of care documented in the CCM and resident's note.  Keo Schirmer, MD 02/09/2020 

## 2020-02-09 NOTE — Progress Notes (Signed)
   CC: Shortness of breath  HPI:  [name] is a 69 y.o. with history of CHF, COPD, CAD, mitral valve repair, hyperlipidemia, hypertension, and pulmonary embolism currently on Coumadin presenting with symptoms of worsening shortness of breath.  Past Medical History:  Diagnosis Date  . Arthritis   . Asthma   . Breast cancer ([place]) 2001   Left Breast Cancer  . CHF (congestive heart failure) ([place])   . COPD (chronic obstructive pulmonary disease) ([place])   . Coronary artery disease   . History of mitral valve replacement with mechanical valve   . HLD (hyperlipidemia)   . Hypertension   . Pulmonary embolism ([place])    Review of Systems:   Constitutional: Negative for chills and fever.  Respiratory: Positive for shortness of breath, cough. Cardiovascular: Positive for chest tightness.  Negative for palpitations and leg swelling.  Gastrointestinal: Negative for abdominal pain, nausea and vomiting.  Neurological: Negative for dizziness and headaches.   Physical Exam:  Vitals:   02/09/20 1027  BP: (!) 172/100  Pulse: 74  Temp: 98.3 F (36.8 C)  TempSrc: Oral  SpO2: 97%  Weight: 180 lb 1.6 oz (81.7 kg)   Physical Exam Constitutional:      Appearance: She is well-developed.  Cardiovascular:     Rate and Rhythm: Normal rate and regular rhythm.  Pulmonary:     Effort: Pulmonary effort is normal.     Breath sounds: Rales (Trace bibasilar crackles) present. No decreased breath sounds, wheezing or rhonchi.  Chest:     Chest wall: Deformity (left breast surgical scar, pain to palpation) present.  Abdominal:     General: Bowel sounds are normal.     Palpations: Abdomen is soft.  Musculoskeletal:        General: Normal range of motion.     Cervical back: Normal range of motion and neck supple.  Skin:    General: Skin is warm and dry.     Capillary Refill: Capillary refill takes less than 2 seconds.  Neurological:     General: No focal deficit present.     Mental Status: She  is alert.  Psychiatric:        Mood and Affect: Mood normal.        Behavior: Behavior normal.      Assessment & Plan:   See Encounters Tab for problem based charting.  Patient discussed with Dr. Rebeca Alert

## 2020-02-09 NOTE — Patient Instructions (Addendum)
Kim Anthony,  It was a pleasure to see you today. Thank you for coming in.   Today we discussed your shortness of breath.  The cause of your shortness of breath is still unclear, your exam today was reassuring.  We are checking some labs and imaging to further evaluate.  This could be related to your heart failure, your COPD, or your prior history of pulmonary embolism.  Please continue taking all your medications as prescribed, try not to miss any doses, especially of the Coumadin. Please continue taking the increased dose of lasix, Lasix twice a day.  Please return to clinic in 1 week or sooner if needed.   Thank you again for coming in.   Asencion Noble.D.

## 2020-02-09 NOTE — Assessment & Plan Note (Addendum)
She reports that the shortness of breath started about 2 weeks ago, she does not remember what she was doing when this occurred, she feels like it is worse when she is walking and thinks that it may improve for the rest.  Endorses difficulty sleeping, waking up at night with shortness of breath, has chronic orthopnea and uses 4 pillows, this is unchanged.  Denies any lower extremity swelling.  She also endorses some chest tightness, occurs on the left side, does not think it changes with activity or rest, associated with her shortness of breath.  Has an occasional cough that sometimes is productive with yellow sputum, states that her breathing treatments for COPD helps.  She reports a chronic weight gain of about 20 pounds over a few months, however has had no changes over the past month.  Denies nausea, vomiting, abdominal pain, headaches, lightheadedness, dizziness, or fatigue.  She denies any recent sick contacts or recent travel.  She has increased her Lasix to twice a day which she thinks may be helping her symptoms.  She also has been using her albuterol about 5-6 times per day, her Anoro daily, and uses her nebulizer 3 times per week, she reports that these breathing treatments sometimes help.  She reports that she will occasionally miss her warfarin, about once per week, previous INRs were subtherapeutic.  On exam she is mildly hypertensive, her oxygen saturation was 97%, decreased to around 91% with ambulation however with quick recovery to mid 90s.  Cardiac exam was unremarkable, pulmonary exam showed mild bibasilar crackles.  No lower extremity edema.  Difficult to appreciate JVD however may have been elevated.  Differential for her shortness of breath includes COPD exacerbation, heart failure exacerbation, ACS, anemia, or URI.  Given her history of PE she does have risk for PE, she is in the low risk group according to the Wells criteria.  Obtained a stat EKG that showed normal sinus rhythm, PAC, T  wave inversions in 1, aVL, and V2-V5, all noted on prior EKGs, no acute ST changes or new T wave inversions.  Will obtain stat CBC, BMP, BNP, troponin, and chest x-ray to evaluate cause of her shortness of breath.  Given the improvement with increased Lasix dose will continue her on the twice daily dosing.  -stat CBC, BMP, BNP, troponin, and chest x-ray  -Continue Lasix 40 mg twice daily -Continue her COPD treatment including albuterol, Anoro, and albuterol nebulizer as needed.  Provided education on proper use of Anoro with spacer -Advised to take her medications as prescribed, advised to not miss her warfarin dose -RTC in 1 week

## 2020-02-13 NOTE — Progress Notes (Signed)
Internal Medicine Clinic Attending  Case discussed with Dr. Krienke at the time of the visit.  We reviewed the resident's history and exam and pertinent patient test results.  I agree with the assessment, diagnosis, and plan of care documented in the resident's note.  Kairi Tufo, M.D., Ph.D.  

## 2020-02-15 ENCOUNTER — Other Ambulatory Visit: Payer: Self-pay

## 2020-02-15 ENCOUNTER — Encounter (HOSPITAL_COMMUNITY): Payer: Self-pay | Admitting: *Deleted

## 2020-02-15 ENCOUNTER — Emergency Department (HOSPITAL_COMMUNITY): Payer: Medicare Other

## 2020-02-15 ENCOUNTER — Ambulatory Visit (INDEPENDENT_AMBULATORY_CARE_PROVIDER_SITE_OTHER): Payer: Medicare Other | Admitting: Internal Medicine

## 2020-02-15 ENCOUNTER — Emergency Department (HOSPITAL_COMMUNITY)
Admission: EM | Admit: 2020-02-15 | Discharge: 2020-02-16 | Disposition: A | Discharge status: Home / Self Care | Payer: Medicare Other | Attending: Emergency Medicine | Admitting: Emergency Medicine

## 2020-02-15 VITALS — BP 123/60 | HR 72 | Temp 100.1°F | Wt 176.8 lb

## 2020-02-15 DIAGNOSIS — H1132 Conjunctival hemorrhage, left eye: Secondary | ICD-10-CM | POA: Diagnosis not present

## 2020-02-15 DIAGNOSIS — J45909 Unspecified asthma, uncomplicated: Secondary | ICD-10-CM | POA: Insufficient documentation

## 2020-02-15 DIAGNOSIS — I10 Essential (primary) hypertension: Secondary | ICD-10-CM

## 2020-02-15 DIAGNOSIS — J449 Chronic obstructive pulmonary disease, unspecified: Secondary | ICD-10-CM | POA: Insufficient documentation

## 2020-02-15 DIAGNOSIS — R040 Epistaxis: Secondary | ICD-10-CM | POA: Diagnosis not present

## 2020-02-15 DIAGNOSIS — R042 Hemoptysis: Secondary | ICD-10-CM | POA: Insufficient documentation

## 2020-02-15 DIAGNOSIS — H5789 Other specified disorders of eye and adnexa: Secondary | ICD-10-CM | POA: Diagnosis not present

## 2020-02-15 DIAGNOSIS — Z79899 Other long term (current) drug therapy: Secondary | ICD-10-CM | POA: Insufficient documentation

## 2020-02-15 DIAGNOSIS — R079 Chest pain, unspecified: Secondary | ICD-10-CM | POA: Diagnosis not present

## 2020-02-15 DIAGNOSIS — Z9012 Acquired absence of left breast and nipple: Secondary | ICD-10-CM | POA: Diagnosis not present

## 2020-02-15 DIAGNOSIS — F17210 Nicotine dependence, cigarettes, uncomplicated: Secondary | ICD-10-CM | POA: Diagnosis not present

## 2020-02-15 DIAGNOSIS — R0981 Nasal congestion: Secondary | ICD-10-CM | POA: Insufficient documentation

## 2020-02-15 DIAGNOSIS — I110 Hypertensive heart disease with heart failure: Secondary | ICD-10-CM | POA: Diagnosis not present

## 2020-02-15 DIAGNOSIS — R0602 Shortness of breath: Secondary | ICD-10-CM

## 2020-02-15 DIAGNOSIS — I5042 Chronic combined systolic (congestive) and diastolic (congestive) heart failure: Secondary | ICD-10-CM | POA: Diagnosis not present

## 2020-02-15 DIAGNOSIS — I5043 Acute on chronic combined systolic (congestive) and diastolic (congestive) heart failure: Secondary | ICD-10-CM | POA: Diagnosis not present

## 2020-02-15 LAB — PROTIME-INR
INR: 3.6 — ABNORMAL HIGH (ref 0.8–1.2)
Prothrombin Time: 34.7 seconds — ABNORMAL HIGH (ref 11.4–15.2)

## 2020-02-15 LAB — CBC WITH DIFFERENTIAL/PLATELET
Abs Immature Granulocytes: 0.03 10*3/uL (ref 0.00–0.07)
Basophils Absolute: 0.1 10*3/uL (ref 0.0–0.1)
Basophils Relative: 1 %
Eosinophils Absolute: 0.2 10*3/uL (ref 0.0–0.5)
Eosinophils Relative: 3 %
HCT: 43 % (ref 36.0–46.0)
Hemoglobin: 13.7 g/dL (ref 12.0–15.0)
Immature Granulocytes: 0 %
Lymphocytes Relative: 28 %
Lymphs Abs: 2.3 10*3/uL (ref 0.7–4.0)
MCH: 31.5 pg (ref 26.0–34.0)
MCHC: 31.9 g/dL (ref 30.0–36.0)
MCV: 98.9 fL (ref 80.0–100.0)
Monocytes Absolute: 0.7 10*3/uL (ref 0.1–1.0)
Monocytes Relative: 9 %
Neutro Abs: 4.8 10*3/uL (ref 1.7–7.7)
Neutrophils Relative %: 59 %
Platelets: 302 10*3/uL (ref 150–400)
RBC: 4.35 MIL/uL (ref 3.87–5.11)
RDW: 12.4 % (ref 11.5–15.5)
WBC: 8.2 10*3/uL (ref 4.0–10.5)
nRBC: 0 % (ref 0.0–0.2)

## 2020-02-15 LAB — TYPE AND SCREEN
ABO/RH(D): O NEG
Antibody Screen: NEGATIVE

## 2020-02-15 LAB — TROPONIN I (HIGH SENSITIVITY)
Troponin I (High Sensitivity): 13 ng/L (ref <18)
Troponin I (High Sensitivity): 16 ng/L (ref <18)

## 2020-02-15 LAB — COMPREHENSIVE METABOLIC PANEL
ALT: 29 U/L (ref 0–44)
AST: 77 U/L — ABNORMAL HIGH (ref 15–41)
Albumin: 3.9 g/dL (ref 3.5–5.0)
Alkaline Phosphatase: 103 U/L (ref 38–126)
Anion gap: 12 (ref 5–15)
BUN: 14 mg/dL (ref 8–23)
CO2: 22 mmol/L (ref 22–32)
Calcium: 9 mg/dL (ref 8.9–10.3)
Chloride: 104 mmol/L (ref 98–111)
Creatinine, Ser: 0.76 mg/dL (ref 0.44–1.00)
GFR calc Af Amer: >60 mL/min (ref >60)
GFR calc non Af Amer: >60 mL/min (ref >60)
Glucose, Bld: 85 mg/dL (ref 70–99)
Potassium: 2.9 mmol/L — ABNORMAL LOW (ref 3.5–5.1)
Sodium: 138 mmol/L (ref 135–145)
Total Bilirubin: 2.3 mg/dL — ABNORMAL HIGH (ref 0.3–1.2)
Total Protein: 8.1 g/dL (ref 6.5–8.1)

## 2020-02-15 LAB — APTT: aPTT: 66 seconds — ABNORMAL HIGH (ref 24–36)

## 2020-02-15 NOTE — Assessment & Plan Note (Signed)
On lisinopril 20 mg daily, metoprolol 25 mg daily, and lasix 40 mg daily. BP today is well controlled at 123/60.  No changes to this regimen.

## 2020-02-15 NOTE — Progress Notes (Signed)
   CC: Red eye, hemoptysis, and shortness of breath  HPI:  [name] is a 69 y.o. with the history listed below presenting for red eye, hemoptysis, and shortness of breath.  Past Medical History:  Diagnosis Date  . Arthritis   . Asthma   . Breast cancer ([place]) 2001   Left Breast Cancer  . CHF (congestive heart failure) ([place])   . COPD (chronic obstructive pulmonary disease) ([place])   . Coronary artery disease   . History of mitral valve replacement with mechanical valve   . HLD (hyperlipidemia)   . Hypertension   . Pulmonary embolism ([place])    Review of Systems:   Constitutional: Negative for chills and fever.  HEENT: Positive for left eye redness, mild pain to palpation, no pain with movement, no blurry vision. Respiratory: Positive for shortness of breath, hemoptysis, and wheezing. Cardiovascular: Negative for chest pain and leg swelling.  Gastrointestinal: Negative for abdominal pain, nausea and vomiting.  Neurological: Negative for dizziness and headaches.   Physical Exam:  Vitals:   02/15/20 1526  BP: 123/60  Pulse: 72  Temp: 100.1 F (37.8 C)  TempSrc: Oral  SpO2: 95%  Weight: 176 lb 12.8 oz (80.2 kg)   Physical Exam Constitutional:      Appearance: She is ill-appearing.  HENT:     Head: Normocephalic.  Eyes:     Extraocular Movements: Extraocular movements intact.     Comments: Left eye- conjunctival hemorrhage, no pain with EOMI  Cardiovascular:     Rate and Rhythm: Normal rate and regular rhythm.  Pulmonary:     Effort: Pulmonary effort is normal.     Breath sounds: Wheezing present.  Abdominal:     General: Abdomen is flat. Bowel sounds are normal.     Palpations: Abdomen is soft.  Musculoskeletal:        General: No swelling. Normal range of motion.     Cervical back: Normal range of motion and neck supple.  Skin:    General: Skin is warm and dry.     Capillary Refill: Capillary refill takes less than 2 seconds.  Neurological:     General: No  focal deficit present.     Mental Status: She is alert and oriented to person, place, and time.  Psychiatric:        Mood and Affect: Mood normal.        Behavior: Behavior normal.        Assessment & Plan:   See Encounters Tab for problem based charting.  Patient discussed with Dr. Heber

## 2020-02-15 NOTE — ED Triage Notes (Signed)
Pt was seen at internal medicine and sent to ED.  Pt states that she has been coughing up blood for three days.  Pt also reports that her left eye is red.

## 2020-02-15 NOTE — Assessment & Plan Note (Signed)
Patient reports that yesterday morning she woke up with some left eye redness and mild pain, she has never had this before.  She denied any vision changes, blurry vision, floaters, itchiness, drainage, pain with movements, trauma, or wearing any contacts.  She has tried warm compresses on the area which she states did not help.  On exam she has some conjunctival hemorrhage on the lateral aspect of her left eye, no pain with extraocular movements.  Appears that this could be scleritis versus infectious keratitis versus conjunctival hemorrhage.  Patient currently being transferred to the ED for evaluation of her hemoptysis.  Will place urgent ophthalmology referral.

## 2020-02-15 NOTE — Assessment & Plan Note (Signed)
Seen on 02/09/20 for worsening shortness of breath. Etiology was unclear however could have been a mild heart failure exacerbation and she was advised to continue the higher dose of lasix and continue her treatment for her COPD. Lbs signfiicant for a mild increase in her BNP at 131 and K 3.3. otherwise her CBC and Bmp were unremarkable. CXR showed no acute cardiopulmonary disease.   Patient reports that about 3 days ago she started coughing up blood, she informs having some mucus draining from, coughing up some bright red blood.  Reports that she feels her breathing is getting a little bit better.  However has been using her albuterol nebulizer about 2 times a day and her inhaler about 5-6 times per day.  Reports some mild wheezing.  She is still taking the elevated dose of Lasix 40 mg twice daily with good urination.  Patient has history of pulmonary embolism currently on Coumadin and recently been subtherapeutic, COPD, and heart failure.  Wells score elevated at 5.5.  Patient needs to get a CTA to evaluate for another PE.  Discussed this with patient and transferred patient to ED for further evaluation and work-up.

## 2020-02-15 NOTE — Patient Instructions (Addendum)
[name],  It was a pleasure to see you today. Thank you for coming in.   Today we discussed your blood in your eye. I am putting a referral into the eye doctor for this.   I am sending you to the emergency room due to your cough. We need to have you evaluated for another blood clot.   Thank you again for coming in.   Asencion Noble.D.

## 2020-02-16 ENCOUNTER — Emergency Department (HOSPITAL_COMMUNITY): Payer: Medicare Other

## 2020-02-16 DIAGNOSIS — R042 Hemoptysis: Secondary | ICD-10-CM | POA: Diagnosis not present

## 2020-02-16 MED ORDER — IOHEXOL 350 MG/ML SOLN
100.0000 mL | Freq: Once | INTRAVENOUS | Status: AC | PRN
  Administered 2020-02-16: 92 mL via INTRAVENOUS

## 2020-02-16 NOTE — ED Notes (Signed)
Pt called for VS, no response.  °

## 2020-02-16 NOTE — ED Notes (Signed)
Na x1 for vitals

## 2020-02-16 NOTE — Discharge Instructions (Addendum)
Please increase your Lasix over the weekend as well as your potassium pills for 1 week.  Follow-up with your primary care provider sometime next week for reevaluation.

## 2020-02-16 NOTE — ED Provider Notes (Signed)
[place] EMERGENCY DEPARTMENT Provider Note  CSN: [id number] Arrival date & time: 02/15/20 1640  Chief Complaint(s) Hematemesis and Shortness of Breath  HPI Kim Anthony is a 69 y.o. female with a past medical history listed below including breast cancer status post mastectomy and radiation, CHF on Lasix, pulmonary emboli on Coumadin who was sent here from internal medicine clinic for evaluation of possible recurrent PEs due to subtherapeutic INR.  Patient has had hemoptysis for the past 3 days.  She also endorsed 3 days of nasal congestion and intermittent nosebleeds.  No fevers or chills.  Patient has had mild shortness of breath.  No other physical complaints.  HPI  Past Medical History Past Medical History:  Diagnosis Date  . Arthritis   . Asthma   . Breast cancer ([place]) 2001   Left Breast Cancer  . CHF (congestive heart failure) ([place])   . COPD (chronic obstructive pulmonary disease) ([place])   . Coronary artery disease   . History of mitral valve replacement with mechanical valve   . HLD (hyperlipidemia)   . Hypertension   . Pulmonary embolism [facility])    Patient Active Problem List   Diagnosis Date Noted  . Eye redness 02/15/2020  . Shortness of breath 02/09/2020  . Chronic pain syndrome 01/17/2020  . Monoarthritis of wrist 10/18/2019  . Ganglion cyst 09/21/2019  . Hip pain 08/23/2019  . Thoracic ascending aortic aneurysm ([place]) 07/05/2019  . Bilateral pneumonia 06/26/2019  . Pustular rash 03/09/2019  . Anemia 02/16/2019  . Breast cancer ([place])   . Acquired absence of left breast 12/20/2018  . Open wound of axillary region 05/11/2018  . COPD (chronic obstructive pulmonary disease) ([place]) 11/25/2017  . S/P MVR (mitral valve replacement)   . Chronic anticoagulation   . Tobacco use disorder 02/25/2017  . Lymphadenopathy, mediastinal 02/25/2017  . History of mitral valve replacement with mechanical valve 11/18/2016  . Essential hypertension 02/01/2016  .  Allergic rhinitis 02/01/2016  . Healthcare maintenance 01/30/2016  . Chronic low back pain 07/22/2015  . Pulmonary embolism ([place]) 07/11/2015  . Chronic combined systolic and diastolic congestive heart failure ([place]) 07/11/2015  . Headache, common migraine 07/11/2015  . HLD (hyperlipidemia) 07/11/2015   Home Medication(s) Prior to Admission medications   Medication Sig Start Date End Date Taking? Authorizing Provider  albuterol (PROVENTIL) (2.5 MG/3ML) 0.083% nebulizer solution Take 3 mLs (2.5 mg total) by nebulization every 6 (six) hours as needed for wheezing or shortness of breath. 06/26/19   Seawell, Jaimie A, DO  albuterol (VENTOLIN HFA) 108 (90 Base) MCG/ACT inhaler Inhale 1-2 puffs into the lungs every 6 (six) hours as needed for wheezing or shortness of breath. 06/26/19   Seawell, Jaimie A, DO  Calcium-Phosphorus-Vitamin D (CITRACAL +D3 PO) Take 1 tablet by mouth 2 (two) times a day.    [provider]  fluticasone (FLONASE) 50 MCG/ACT nasal spray Place 2 sprays into both nostrils daily. Patient taking differently: Place 1 spray into both nostrils daily as needed for allergies.  12/03/16 04/11/26  Mignon Pine, DO  furosemide (LASIX) 40 MG tablet Take 1 tablet (40 mg total) by mouth 2 (two) times daily. 02/09/20   Asencion Noble, MD  gabapentin (NEURONTIN) 600 MG tablet Take 1 tablet (600 mg total) by mouth 4 (four) times daily. X 4 days, then go to 1200 mg 3x/day. For nerve pain 11/01/19   Lovorn, Jinny Blossom, MD  HYDROcodone-acetaminophen (NORCO) 5-325 MG tablet Take 1 tablet by mouth every 8 (eight) hours  as needed for moderate pain. 01/29/20   Lovorn, Jinny Blossom, MD  HYDROcodone-acetaminophen (NORCO/VICODIN) 5-325 MG tablet Take 1 tablet by mouth 3 (three) times daily as needed for moderate pain. 01/23/20   Bayard Hugger, NP  lisinopril (ZESTRIL) 20 MG tablet Take 1 tablet by mouth once daily 01/11/20   Marianna Payment, MD  metoprolol succinate (TOPROL-XL) 25 MG 24 hr tablet Take 1 tablet  (25 mg total) by mouth daily. 10/20/19   Katherine Roan, MD  potassium chloride SA (KLOR-CON) 20 MEQ tablet Take 2 tablets (40 mEq total) by mouth daily. 08/09/19   Antonietta Breach, PA-C  pregabalin (LYRICA) 100 MG capsule Take 1 capsule (100 mg total) by mouth 3 (three) times daily. 01/17/20   Lovorn, Jinny Blossom, MD  simvastatin (ZOCOR) 20 MG tablet Take 1 tablet by mouth once daily 01/11/20   Marianna Payment, MD  Spacer/Aero-Holding Chambers (BREATHERITE COLL SPACER ADULT) MISC 1 applicator by Does not apply route daily. 01/11/20   [place]omfield, Carley D, DO  Tiotropium Bromide-Olodaterol (STIOLTO RESPIMAT) 2.5-2.5 MCG/ACT AERS Inhale 2 puffs into the lungs daily. 11/28/19   Asencion Noble, MD  warfarin (COUMADIN) 5 MG tablet Take 1 tablet (5 mg total) by mouth daily at 4 PM. 01/22/20   Sid Falcon, MD                                                                                                                                    Past Surgical History Past Surgical History:  Procedure Laterality Date  . APPLICATION OF A-CELL OF CHEST/ABDOMEN Left 01/27/2019   Procedure: APPLICATION OF A-CELL OF CHEST;  Surgeon: Wallace Going, DO;  Location: [place];  Service: Plastics;  Laterality: Left;  . APPLICATION OF WOUND VAC N/A 01/27/2019   Procedure: APPLICATION OF WOUND VAC;  Surgeon: Wallace Going, DO;  Location: [place];  Service: Plastics;  Laterality: N/A;  . COLONOSCOPY WITH PROPOFOL N/A 06/07/2018   Procedure: COLONOSCOPY WITH PROPOFOL;  Surgeon: Wilford Corner, MD;  Location: [facility];  Service: Endoscopy;  Laterality: N/A;  . DEBRIDEMENT AND CLOSURE WOUND Left 12/28/2018   Procedure: Debridement And Closure Wound;  Surgeon: Coralie Keens, MD;  Location: [place];  Service: General;  Laterality: Left;  . INCISION AND DRAINAGE OF WOUND Left 01/27/2019   Procedure: IRRIGATION AND DEBRIDEMENT OF CHEST WALL;  Surgeon: Wallace Going, DO;  Location: [place];  Service: Plastics;  Laterality:  Left;  . LATISSIMUS FLAP TO BREAST Left 01/18/2019   Procedure: LEFT LATISSIMUS FLAP TO BREAST;  Surgeon: Wallace Going, DO;  Location: [place];  Service: Plastics;  Laterality: Left;  [name] MASTECTOMY Left 2000s   for breast cancer, also s/p radiation and chemo  . MITRAL VALVE REPLACEMENT     mechanical  . POLYPECTOMY  06/07/2018   Procedure: POLYPECTOMY;  Surgeon: Wilford Corner, MD;  Location: [facility];  Service: Endoscopy;;  . WOUND DEBRIDEMENT Left  12/28/2018   Procedure: EXCISION OF LEFT CHRONIC CHEST WALL WOUND;  Surgeon: Coralie Keens, MD;  Location: [place];  Service: General;  Laterality: Left;   Family History Family History  Problem Relation Age of Onset  . Heart attack Other   . Breast cancer Maternal Aunt 66    Social History Social History   Tobacco Use  . Smoking status: Current Every Day Smoker    Packs/day: 0.25    Years: 30.00    Pack years: 7.50    Types: Cigarettes  . Smokeless tobacco: Never Used  . Tobacco comment: Sometimes less.  Vaping Use  . Vaping Use: Never used  Substance Use Topics  . Alcohol use: Yes    Comment: beer- ocasional  . Drug use: No   Allergies Acetaminophen-codeine, Propoxyphene, Tape, and Tramadol  Review of Systems Review of Systems All other systems are reviewed and are negative for acute change except as noted in the HPI  Physical Exam Vital Signs  I have reviewed the triage vital signs BP 129/82 (BP Location: Right Arm)   Pulse (!) 50   Temp 98.8 F (37.1 C) (Oral)   Resp 16   SpO2 95%   Physical Exam Vitals reviewed.  Constitutional:      General: She is not in acute distress.    Appearance: She is well-developed. She is not diaphoretic.  HENT:     Head: Normocephalic and atraumatic.     Nose: Nose normal.     Mouth/Throat:     Pharynx: No pharyngeal swelling or posterior oropharyngeal erythema.     Tonsils: No tonsillar exudate.  Eyes:     General: No scleral icterus.       Right eye: No  discharge.        Left eye: No discharge.     Conjunctiva/sclera: Conjunctivae normal.     Pupils: Pupils are equal, round, and reactive to light.     Comments: Subconjunctival hemorrhage to the left eye  Cardiovascular:     Rate and Rhythm: Normal rate and regular rhythm.     Heart sounds: No murmur heard.  No friction rub. No gallop.   Pulmonary:     Effort: Pulmonary effort is normal. No respiratory distress.     Breath sounds: Normal breath sounds. No stridor. No rales.  Abdominal:     General: There is no distension.     Palpations: Abdomen is soft.     Tenderness: There is no abdominal tenderness.  Musculoskeletal:        General: No tenderness.     Cervical back: Normal range of motion and neck supple.  Skin:    General: Skin is warm and dry.     Findings: No erythema or rash.  Neurological:     Mental Status: She is alert and oriented to person, place, and time.     ED Results and Treatments Labs (all labs ordered are listed, but only abnormal results are displayed) Labs Reviewed  COMPREHENSIVE METABOLIC PANEL - Abnormal; Notable for the following components:      Result Value   Potassium 2.9 (*)    AST 77 (*)    Total Bilirubin 2.3 (*)    All other components within normal limits  PROTIME-INR - Abnormal; Notable for the following components:   Prothrombin Time 34.7 (*)    INR 3.6 (*)    All other components within normal limits  APTT - Abnormal; Notable for the following components:   aPTT 66 (*)  All other components within normal limits  CBC WITH DIFFERENTIAL/PLATELET  TYPE AND SCREEN  ABO/RH  TROPONIN I (HIGH SENSITIVITY)  TROPONIN I (HIGH SENSITIVITY)                                                                                                                         EKG  EKG Interpretation  Date/Time:    Ventricular Rate:    PR Interval:    QRS Duration:   QT Interval:    QTC Calculation:   R Axis:     Text Interpretation:         Radiology DG Chest 2 View  Result Date: 02/15/2020 CLINICAL DATA:  Shortness of breath.  Left-sided chest pain. EXAM: CHEST - 2 VIEW COMPARISON:  02/09/2020 FINDINGS: The cardio pericardial silhouette is enlarged. Interstitial markings are diffusely coarsened with chronic features. Patchy airspace disease noted left lung including a 5 cm focal potentially cavitary lesion in the peripheral parenchyma. No pleural effusion. Patient is status post mitral valve replacement. Bones are diffusely demineralized. IMPRESSION: Patchy airspace disease in the left lung including a 5 cm focal potentially cavitary lesion in the peripheral parenchyma. CT chest recommended to further evaluate. Electronically Signed   By: Misty Stanley M.D.   On: 02/15/2020 18:23   CT Angio Chest PE W and/or Wo Contrast  Result Date: 02/16/2020 CLINICAL DATA:  Hemoptysis EXAM: CT ANGIOGRAPHY CHEST WITH CONTRAST TECHNIQUE: Multidetector CT imaging of the chest was performed using the standard protocol during bolus administration of intravenous contrast. Multiplanar CT image reconstructions and MIPs were obtained to evaluate the vascular anatomy. CONTRAST:  48mL OMNIPAQUE IOHEXOL 350 MG/ML SOLN COMPARISON:  07/04/2019 FINDINGS: Cardiovascular: There is excellent opacification of the central pulmonary arteries. No intraluminal filling defect to suggest acute pulmonary embolism. 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 There is borderline enlargement of the central pulmonary arteries. There surgical changes of coronary artery bypass grafting and mitral valve replacement noted. There is moderate cardiomegaly. Left ventricular hypertrophy is noted. There is dilation of the right ventricle and right atrium suggesting changes of elevated right heart pressure. Extensive calcification is seen within the native coronary arteries. No pericardial effusion. The thoracic aorta is mildly ectatic  in its descending segment measuring 3 cm in maximal dimension. Mediastinum/Nodes: There is shotty mediastinal and right axillary adenopathy without frank pathologic enlargement. This appears stable since prior examination. Lungs/Pleura: Moderate centrilobular emphysema. There has developed diffuse superimposed ground-glass pulmonary infiltrate within the lungs demonstrating a basilar predominance with subtle smooth interlobular septal thickening best noted on coronal reformats in keeping with probable mild probable cardiogenic pulmonary edema. Post radiation fibrosis is noted within the left apex and within the subpleural anterior left upper lobe. No pneumothorax or pleural effusion. Upper Abdomen: No acute abnormality. Musculoskeletal: Left mastectomy and axillary lymph node dissection has been performed. No lytic or blastic bone lesion identified. Review of the MIP images confirms the above findings. IMPRESSION: 1.  No evidence of acute pulmonary embolism. 2. 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 3. Interval development of mild cardiogenic pulmonary edema. 4. Moderate cardiomegaly with findings suggesting elevated right heart pressure. 5. Moderate emphysema Emphysema (ICD10-J43.9). Electronically Signed   By: Fidela Salisbury MD   On: 02/16/2020 03:04    Pertinent labs & imaging results that were available during my care of the patient were reviewed by me and considered in my medical decision making (see chart for details).  Medications Ordered in ED Medications  iohexol (OMNIPAQUE) 350 MG/ML injection 100 mL (92 mLs Intravenous Contrast Given 02/16/20 0240)                                                                                                                                    Procedures Procedures  (including critical care time)  Medical Decision Making / ED Course I have reviewed the nursing notes for this encounter and the  patient's prior records (if available in EHR or on provided paperwork).   Chrystina Naff was evaluated in Emergency Department on 02/16/2020 for the symptoms described in the history of present illness. She was evaluated in the context of the global COVID-19 pandemic, which necessitated consideration that the patient might be at risk for infection with the SARS-CoV-2 virus that causes COVID-19. Institutional protocols and algorithms that pertain to the evaluation of patients at risk for COVID-19 are in a state of rapid change based on information released by regulatory bodies including the CDC and federal and state organizations. These policies and algorithms were followed during the patient's care in the ED.  Labs grossly reassuring other than mild hypokalemia likely secondary to her Lasix. Chest x-ray noted possible cavitary lesion. On review of records, patient's last CT scan showed left lung masslike area of fibrosis likely secondary from radiation therapy.  CTA was obtained which ruled out a PE.  Did reveal some mild pulmonary edema likely from her CHF.  Patient is satting well on room air.  We will have her increase her Lasix over the weekend as well as her potassium tablets.  PCP follow-up recommended.      Final Clinical Impression(s) / ED Diagnoses Final diagnoses:  Nasal congestion  Hemoptysis  Acute on chronic combined systolic and diastolic congestive heart failure ([place])    The patient appears reasonably screened and/or stabilized for discharge and I doubt any other medical condition or other [facility] requiring further screening, evaluation, or treatment in the ED at this time prior to discharge. Safe for discharge with strict return precautions.  Disposition: Discharge  Condition: Good  I have discussed the results, Dx and Tx plan with the patient/family who expressed understanding and agree(s) with the plan. Discharge instructions discussed at length. The patient/family was given  strict return precautions who verbalized understanding of the instructions. No further questions at time of discharge.    ED Discharge  Orders    None     Follow Up: Primary care provider  Schedule an appointment as soon as possible for a visit  in 5-7 days, For close follow up.     This chart was dictated using voice recognition software.  Despite best efforts to proofread,  errors can occur which can change the documentation meaning.   Fatima Blank, MD 02/16/20 [phone]

## 2020-02-21 ENCOUNTER — Other Ambulatory Visit: Payer: Self-pay | Admitting: Internal Medicine

## 2020-02-21 NOTE — Progress Notes (Signed)
Internal Medicine Clinic Attending ° °Case discussed with Dr. Krienke  At the time of the visit.  We reviewed the resident’s history and exam and pertinent patient test results.  I agree with the assessment, diagnosis, and plan of care documented in the resident’s note.  °

## 2020-02-22 ENCOUNTER — Other Ambulatory Visit: Payer: Self-pay | Admitting: *Deleted

## 2020-02-22 DIAGNOSIS — I5042 Chronic combined systolic (congestive) and diastolic (congestive) heart failure: Secondary | ICD-10-CM

## 2020-02-22 MED ORDER — METOPROLOL SUCCINATE ER 25 MG PO TB24: 25.0000 mg | ORAL_TABLET | Freq: Every day | ORAL | 3 refills | Status: DC

## 2020-02-28 ENCOUNTER — Encounter: Payer: Self-pay | Admitting: Internal Medicine

## 2020-02-28 ENCOUNTER — Ambulatory Visit (INDEPENDENT_AMBULATORY_CARE_PROVIDER_SITE_OTHER): Payer: Medicare Other | Admitting: Internal Medicine

## 2020-02-28 ENCOUNTER — Encounter
Payer: Medicare Other | Attending: Physical Medicine and Rehabilitation | Admitting: Physical Medicine and Rehabilitation

## 2020-02-28 ENCOUNTER — Encounter: Payer: Self-pay | Admitting: Physical Medicine and Rehabilitation

## 2020-02-28 ENCOUNTER — Other Ambulatory Visit: Payer: Self-pay

## 2020-02-28 VITALS — BP 108/65 | HR 65 | Wt 180.8 lb

## 2020-02-28 VITALS — Ht 65.0 in | Wt 180.0 lb

## 2020-02-28 DIAGNOSIS — Z7901 Long term (current) use of anticoagulants: Secondary | ICD-10-CM

## 2020-02-28 DIAGNOSIS — I5042 Chronic combined systolic (congestive) and diastolic (congestive) heart failure: Secondary | ICD-10-CM | POA: Diagnosis not present

## 2020-02-28 DIAGNOSIS — M5441 Lumbago with sciatica, right side: Secondary | ICD-10-CM | POA: Diagnosis not present

## 2020-02-28 DIAGNOSIS — G894 Chronic pain syndrome: Secondary | ICD-10-CM | POA: Insufficient documentation

## 2020-02-28 DIAGNOSIS — Z5181 Encounter for therapeutic drug level monitoring: Secondary | ICD-10-CM | POA: Insufficient documentation

## 2020-02-28 DIAGNOSIS — J449 Chronic obstructive pulmonary disease, unspecified: Secondary | ICD-10-CM | POA: Diagnosis not present

## 2020-02-28 DIAGNOSIS — I110 Hypertensive heart disease with heart failure: Secondary | ICD-10-CM

## 2020-02-28 DIAGNOSIS — G8929 Other chronic pain: Secondary | ICD-10-CM | POA: Diagnosis not present

## 2020-02-28 DIAGNOSIS — Z79891 Long term (current) use of opiate analgesic: Secondary | ICD-10-CM | POA: Insufficient documentation

## 2020-02-28 LAB — POCT INR: INR: 1.4 — AB (ref 2.0–3.0)

## 2020-02-28 MED ORDER — HYDROCODONE-ACETAMINOPHEN 5-325 MG PO TABS
1.0000 | ORAL_TABLET | Freq: Three times a day (TID) | ORAL | 0 refills | Status: DC | PRN
Start: 2020-02-28 — End: 2020-03-27

## 2020-02-28 NOTE — Patient Instructions (Signed)
Thank you for trusting me with your care. To recap, today we discussed the following:   1. Chronic anticoagulation  - POCT INR  2. Heart Failure  - Take Lasix 40 mg twice daily  - Take Potassium 40 meq daily  - BMP8+Anion Gap - I will call with results of labwork  3. COPD  - Pulmonary function test; Future

## 2020-02-28 NOTE — Progress Notes (Signed)
Subjective:    Patient ID: Kim Anthony, female    DOB: [birth year], 69 y.o.   [name]: [id number]  HPI  Due to national recommendations of social distancing because of COVID 75, an audio/video tele-health visit is felt to be the most appropriate encounter for this patient at this time. See MyChart message from today for the patient's consent to a tele-health encounter with [place]. This is a follow up tele-visit via Webex. The patient is at home. MD is at office.   Patient is a 69 yr old female with aortic stenosis s/p AVR on Coumadin, HTN, hx of PE's on chronic coumadin , CHF, and COPD- here for f/u of R side low back Pain with sciatica. Tramadol makes her ill as well as Tylenol #3 and Darvocet.  Doing pretty good Has bad sinus infection- in pain from eye- blown nose so much- blew a blood vessel in eye- a lot clearer now.   Doing better now.  Norco is working well-   Lyrica makes her sleepy- takes both pills at night- to sleep- since cannot take during day.  Helping some of the nerve pain.   A lot better than when we last saw each other.    Pain Inventory Average Pain 7 Pain Right Now 7 My pain is intermittent, sharp and aching  In the last 24 hours, has pain interfered with the following? General activity 2 Relation with others 0 Enjoyment of life 4 What TIME of day is your pain at its worst? morning  and night Sleep (in general) Fair  Pain is worse with: walking, standing and some activites Pain improves with: rest and medication Relief from Meds: 7  Family History  Problem Relation Age of Onset  . Heart attack Other   . Breast cancer Maternal Aunt 67   Social History   Socioeconomic History  . Marital status: Single    Spouse name: Not on file  . Number of children: Not on file  . Years of education: Not on file  . Highest education level: Not on file  Occupational History  . Not on file  Tobacco Use  . Smoking status:  Current Every Day Smoker    Packs/day: 0.25    Years: 30.00    Pack years: 7.50    Types: Cigarettes  . Smokeless tobacco: Never Used  . Tobacco comment: Sometimes less.  Vaping Use  . Vaping Use: Never used  Substance and Sexual Activity  . Alcohol use: Yes    Comment: beer- ocasional  . Drug use: No  . Sexual activity: Not Currently    Birth control/protection: None  Other Topics Concern  . Not on file  Social History Narrative   ** Merged History Encounter **       Social Determinants of Health   Financial Resource Strain: Low Risk   . Difficulty of Paying Living Expenses: Not hard at all  Food Insecurity: No Food Insecurity  . Worried About Charity fundraiser in the Last Year: Never true  . Ran Out of Food in the Last Year: Never true  Transportation Needs: No Transportation Needs  . Lack of Transportation (Medical): No  . Lack of Transportation (Non-Medical): No  Physical Activity:   . Days of Exercise per Week: Not on file  . Minutes of Exercise per Session: Not on file  Stress:   . Feeling of Stress : Not on file  Social Connections: Socially Isolated  . Frequency of  Communication with Friends and Family: More than three times a week  . Frequency of Social Gatherings with Friends and Family: More than three times a week  . Attends Religious Services: Never  . Active Member of Clubs or Organizations: No  . Attends Archivist Meetings: Not on file  . Marital Status: Never married   Past Surgical History:  Procedure Laterality Date  . APPLICATION OF A-CELL OF CHEST/ABDOMEN Left 01/27/2019   Procedure: APPLICATION OF A-CELL OF CHEST;  Surgeon: Wallace Going, DO;  Location: [place];  Service: Plastics;  Laterality: Left;  . APPLICATION OF WOUND VAC N/A 01/27/2019   Procedure: APPLICATION OF WOUND VAC;  Surgeon: Wallace Going, DO;  Location: [place];  Service: Plastics;  Laterality: N/A;  . COLONOSCOPY WITH PROPOFOL N/A 06/07/2018   Procedure:  COLONOSCOPY WITH PROPOFOL;  Surgeon: Wilford Corner, MD;  [facility];  Service: Endoscopy;  Laterality: N/A;  . DEBRIDEMENT AND CLOSURE WOUND Left 12/28/2018   Procedure: Debridement And Closure Wound;  Surgeon: Coralie Keens, MD;  Location: [place];  Service: General;  Laterality: Left;  . INCISION AND DRAINAGE OF WOUND Left 01/27/2019   Procedure: IRRIGATION AND DEBRIDEMENT OF CHEST WALL;  Surgeon: Wallace Going, DO;  Location: [place];  Service: Plastics;  Laterality: Left;  . LATISSIMUS FLAP TO BREAST Left 01/18/2019   Procedure: LEFT LATISSIMUS FLAP TO BREAST;  Surgeon: Wallace Going, DO;  Location: [place];  Service: Plastics;  Laterality: Left;  [name] MASTECTOMY Left 2000s   for breast cancer, also s/p radiation and chemo  . MITRAL VALVE REPLACEMENT     mechanical  . POLYPECTOMY  06/07/2018   Procedure: POLYPECTOMY;  Surgeon: Wilford Corner, MD;  [facility];  Service: Endoscopy;;  . WOUND DEBRIDEMENT Left 12/28/2018   Procedure: EXCISION OF LEFT CHRONIC CHEST WALL WOUND;  Surgeon: Coralie Keens, MD;  Location: [place];  Service: General;  Laterality: Left;   Past Surgical History:  Procedure Laterality Date  . APPLICATION OF A-CELL OF CHEST/ABDOMEN Left 01/27/2019   Procedure: APPLICATION OF A-CELL OF CHEST;  Surgeon: Wallace Going, DO;  Location: [place];  Service: Plastics;  Laterality: Left;  . APPLICATION OF WOUND VAC N/A 01/27/2019   Procedure: APPLICATION OF WOUND VAC;  Surgeon: Wallace Going, DO;  Location: [place];  Service: Plastics;  Laterality: N/A;  . COLONOSCOPY WITH PROPOFOL N/A 06/07/2018   Procedure: COLONOSCOPY WITH PROPOFOL;  Surgeon: Wilford Corner, MD;  [facility];  Service: Endoscopy;  Laterality: N/A;  . DEBRIDEMENT AND CLOSURE WOUND Left 12/28/2018   Procedure: Debridement And Closure Wound;  Surgeon: Coralie Keens, MD;  Location: [place];  Service: General;  Laterality: Left;  . INCISION AND DRAINAGE OF  WOUND Left 01/27/2019   Procedure: IRRIGATION AND DEBRIDEMENT OF CHEST WALL;  Surgeon: Wallace Going, DO;  Location: [place];  Service: Plastics;  Laterality: Left;  . LATISSIMUS FLAP TO BREAST Left 01/18/2019   Procedure: LEFT LATISSIMUS FLAP TO BREAST;  Surgeon: Wallace Going, DO;  Location: [place];  Service: Plastics;  Laterality: Left;  [name] MASTECTOMY Left 2000s   for breast cancer, also s/p radiation and chemo  . MITRAL VALVE REPLACEMENT     mechanical  . POLYPECTOMY  06/07/2018   Procedure: POLYPECTOMY;  Surgeon: Wilford Corner, MD;  [facility];  Service: Endoscopy;;  . WOUND DEBRIDEMENT Left 12/28/2018   Procedure: EXCISION OF LEFT CHRONIC CHEST WALL WOUND;  Surgeon: Coralie Keens, MD;  Location:  [facility];  Service: General;  Laterality: Left;   Past Medical History:  Diagnosis Date  . Arthritis   . Asthma   . Breast cancer ([place]) 2001   Left Breast Cancer  . CHF (congestive heart failure) ([place])   . COPD (chronic obstructive pulmonary disease) ([place])   . Coronary artery disease   . History of mitral valve replacement with mechanical valve   . HLD (hyperlipidemia)   . Hypertension   . Pulmonary embolism (HCC)    Ht 5\' 5"  (1.651 m)   Wt 180 lb (81.6 kg)   BMI 29.95 kg/m   Opioid Risk Score:   Fall Risk Score:  `1  Depression screen PHQ 2/9  Depression screen [facility] 2/9 02/28/2020 11/01/2019 09/14/2019 08/16/2019 03/09/2019 04/28/2018 04/21/2018  Decreased Interest 0 0 0 0 0 0 0  Down, Depressed, Hopeless 0 0 0 0 0 0 0  PHQ - 2 Score 0 0 0 0 0 0 0  Altered sleeping - 3 1 0 - - -  Tired, decreased energy - 1 1 0 - - -  Change in appetite - 1 0 0 - - -  Feeling bad or failure about yourself  - 0 0 0 - - -  Trouble concentrating - 0 0 0 - - -  Moving slowly or fidgety/restless - 0 0 0 - - -  Suicidal thoughts - 0 0 0 - - -  PHQ-9 Score - 5 2 0 - - -  Difficult doing work/chores - Somewhat difficult Not difficult at all Not difficult at all - - -  Some recent  data might be hidden    Review of Systems  Constitutional: Negative.   HENT: Negative.   Eyes: Negative.   Respiratory: Negative.   Cardiovascular: Negative.   Gastrointestinal: Negative.   Endocrine: Negative.   Genitourinary: Negative.   Musculoskeletal: Positive for back pain.  Skin: Negative.   Allergic/Immunologic: Negative.   Neurological: Negative.   Hematological: Bruises/bleeds easily.       Warfarin  Psychiatric/Behavioral: Negative.   All other systems reviewed and are negative.      Objective:   Physical Exam   webex Has blood vessel broke in 1 eye Sounds nasally from sinus infection     Assessment & Plan:  Patient is a 69 yr old female with aortic stenosis s/p AVR on Coumadin, HTN, hx of PE's on chronic coumadin , CHF, and COPD- here for f/u of R side low back Pain with sciatica. Tramadol makes her ill as well as Tylenol #3 and Darvocet. Has sinus infection- already in PO ABX.    1.. Needs refill of Norco- is working well for her-pain is much better controlled - sent to [facility] for pt- is due today  2. Has refills for Lyrica.  Just taking 100 mg QHS at this time. Is working- a higher dose makes her sleepy.   3. Should be off gabapentin by now-    4. Will schedule f/u in 2.5 to 3 months- to check on how pt is doing.

## 2020-02-28 NOTE — Patient Instructions (Signed)
Patient is a 69 yr old female with aortic stenosis s/p AVR on Coumadin, HTN, hx of PE's on chronic coumadin , CHF, and COPD- here for f/u of R side low back Pain with sciatica. Tramadol makes her ill as well as Tylenol #3 and Darvocet. Has sinus infection- already in PO ABX.    1.. Needs refill of Norco- is working well for her-pain is much better controlled - sent to [facility] for pt- is due today  2. Has refills for Lyrica.  Just taking 100 mg QHS at this time. Is working- a higher dose makes her sleepy.   3. Should be off gabapentin by now-    4. Will schedule f/u in 2.5 to 3 months- to check on how pt is doing.

## 2020-02-28 NOTE — Progress Notes (Signed)
   CC: COPD and heart failure   HPI:Ms.Elea Holtzclaw is a 69 y.o. female who presents for evaluation of COPD and heart failure . Please see individual problem based A/P for details.   Past Medical History:  Diagnosis Date  . Arthritis   . Asthma   . Breast cancer ([place]) 2001   Left Breast Cancer  . CHF (congestive heart failure) ([place])   . COPD (chronic obstructive pulmonary disease) ([place])   . Coronary artery disease   . History of mitral valve replacement with mechanical valve   . HLD (hyperlipidemia)   . Hypertension   . Pulmonary embolism ([place])    Review of Systems:   Review of Systems  Constitutional: Negative for chills and fever.  Respiratory: Negative for cough and wheezing.   Cardiovascular: Negative for chest pain and palpitations.     Physical Exam: Vitals:   02/28/20 1053  BP: 108/65  Pulse: 65  SpO2: 96%  Weight: 180 lb 12.8 oz (82 kg)   General: NAD, nl appearance HEENT: Normocephalic, atraumatic , Conjunctiva nl  Cardiovascular: Normal rate, regular rhythm. Mechanical click hear Pulmonary : Equal breath sounds, No wheezes, rales, or rhonchi Abdominal: soft, nontender,  bowel sounds present   Assessment & Plan:   See Encounters Tab for problem based charting.  Patient discussed with Dr. Dareen Piano

## 2020-02-29 ENCOUNTER — Encounter: Payer: Self-pay | Admitting: Internal Medicine

## 2020-02-29 LAB — BMP8+ANION GAP
Anion Gap: 15 mmol/L (ref 10.0–18.0)
BUN/Creatinine Ratio: 17 (ref 12–28)
BUN: 11 mg/dL (ref 8–27)
CO2: 23 mmol/L (ref 20–29)
Calcium: 9.3 mg/dL (ref 8.7–10.3)
Chloride: 100 mmol/L (ref 96–106)
Creatinine, Ser: 0.65 mg/dL (ref 0.57–1.00)
GFR calc Af Amer: 104 mL/min/{1.73_m2} (ref >59)
GFR calc non Af Amer: 90 mL/min/{1.73_m2} (ref >59)
Glucose: 98 mg/dL (ref 65–99)
Potassium: 3.1 mmol/L — ABNORMAL LOW (ref 3.5–5.2)
Sodium: 138 mmol/L (ref 134–144)

## 2020-02-29 NOTE — Assessment & Plan Note (Signed)
>>  ASSESSMENT AND PLAN FOR CHF (CONGESTIVE HEART FAILURE) (HCC) WRITTEN ON 09/14/2023 11:43 AM BY Lovett Coffin, DO   >>ASSESSMENT AND PLAN FOR BIVENTRICULAR HEART FAILURE WITH REDUCED LEFT VENTRICULAR FUNCTION (HCC) WRITTEN ON 02/29/2020  1:36 PM BY Albertha Ghee, MD  Patient here for follow up of recent SOB. Concern was for PE and this was ruled out in ED. Patient did have pulmonary edema not on imaging. Her morning lasix dose was increase from 40 to 60 mg. She says her breathing has improved with increased diuresis. Last echo 12/2018 showed normal EF. She has a mechanical mitral valve.   On exam euvolemic. Unsure of previous dry weight, but 180 lbs today. Given instructions to continue to monitor weight at home.   Plan: - BMP8+Anion Gap - Given patient is euvolemic, will have patient start taking her Lasix 40 mg BID and weigh herself. If noticing 3lbs weight gain in 1 day or 5 lbs in 1 week she will call the clinic.

## 2020-02-29 NOTE — Assessment & Plan Note (Signed)
>>  ASSESSMENT AND PLAN FOR BIVENTRICULAR HEART FAILURE WITH REDUCED LEFT VENTRICULAR FUNCTION (HCC) WRITTEN ON 02/29/2020  1:36 PM BY Albertha Ghee, MD  Patient here for follow up of recent SOB. Concern was for PE and this was ruled out in ED. Patient did have pulmonary edema not on imaging. Her morning lasix dose was increase from 40 to 60 mg. She says her breathing has improved with increased diuresis. Last echo 12/2018 showed normal EF. She has a mechanical mitral valve.   On exam euvolemic. Unsure of previous dry weight, but 180 lbs today. Given instructions to continue to monitor weight at home.   Plan: - BMP8+Anion Gap - Given patient is euvolemic, will have patient start taking her Lasix 40 mg BID and weigh herself. If noticing 3lbs weight gain in 1 day or 5 lbs in 1 week she will call the clinic.

## 2020-02-29 NOTE — Assessment & Plan Note (Signed)
>>  ASSESSMENT AND PLAN FOR HFREF (HEART FAILURE WITH REDUCED EJECTION FRACTION) (HCC) WRITTEN ON 09/14/2023 11:44 AM BY Adrieanna Boteler, DO   >>ASSESSMENT AND PLAN FOR CHRONIC HFREF (HEART FAILURE WITH REDUCED EJECTION FRACTION) (HCC) WRITTEN ON 09/14/2023 11:43 AM BY Brie Eppard, DO   >>ASSESSMENT AND PLAN FOR CHF (CONGESTIVE HEART FAILURE) (HCC) WRITTEN ON 09/14/2023 11:43 AM BY Cristofher Livecchi, DO   >>ASSESSMENT AND PLAN FOR BIVENTRICULAR HEART FAILURE WITH REDUCED LEFT VENTRICULAR FUNCTION (HCC) WRITTEN ON 02/29/2020  1:36 PM BY Albertha Ghee, MD  Patient here for follow up of recent SOB. Concern was for PE and this was ruled out in ED. Patient did have pulmonary edema not on imaging. Her morning lasix dose was increase from 40 to 60 mg. She says her breathing has improved with increased diuresis. Last echo 12/2018 showed normal EF. She has a mechanical mitral valve.   On exam euvolemic. Unsure of previous dry weight, but 180 lbs today. Given instructions to continue to monitor weight at home.   Plan: - BMP8+Anion Gap - Given patient is euvolemic, will have patient start taking her Lasix 40 mg BID and weigh herself. If noticing 3lbs weight gain in 1 day or 5 lbs in 1 week she will call the clinic.

## 2020-02-29 NOTE — Assessment & Plan Note (Signed)
>>  ASSESSMENT AND PLAN FOR CHRONIC HFREF (HEART FAILURE WITH REDUCED EJECTION FRACTION) (HCC) WRITTEN ON 09/14/2023 11:43 AM BY Terry Bolotin, DO   >>ASSESSMENT AND PLAN FOR CHF (CONGESTIVE HEART FAILURE) (HCC) WRITTEN ON 09/14/2023 11:43 AM BY Calyx Hawker, DO   >>ASSESSMENT AND PLAN FOR BIVENTRICULAR HEART FAILURE WITH REDUCED LEFT VENTRICULAR FUNCTION (HCC) WRITTEN ON 02/29/2020  1:36 PM BY Albertha Ghee, MD  Patient here for follow up of recent SOB. Concern was for PE and this was ruled out in ED. Patient did have pulmonary edema not on imaging. Her morning lasix dose was increase from 40 to 60 mg. She says her breathing has improved with increased diuresis. Last echo 12/2018 showed normal EF. She has a mechanical mitral valve.   On exam euvolemic. Unsure of previous dry weight, but 180 lbs today. Given instructions to continue to monitor weight at home.   Plan: - BMP8+Anion Gap - Given patient is euvolemic, will have patient start taking her Lasix 40 mg BID and weigh herself. If noticing 3lbs weight gain in 1 day or 5 lbs in 1 week she will call the clinic.

## 2020-02-29 NOTE — Assessment & Plan Note (Addendum)
Patient here for follow up of recent SOB. Concern was for PE and this was ruled out in ED. Patient did have pulmonary edema not on imaging. Her morning lasix dose was increase from 40 to 60 mg. She says her breathing has improved with increased diuresis. Last echo 12/2018 showed normal EF. She has a mechanical mitral valve.   On exam euvolemic. Unsure of previous dry weight, but 180 lbs today. Given instructions to continue to monitor weight at home.   Plan: - BMP8+Anion Gap - Given patient is euvolemic, will have patient start taking her Lasix 40 mg BID and weigh herself. If noticing 3lbs weight gain in 1 day or 5 lbs in 1 week she will call the clinic.

## 2020-02-29 NOTE — Assessment & Plan Note (Addendum)
Patient currenty on LABA/LAMA and Albuterol inhaler. She also uses an albuterol inhaler occasionally. She continues to smoke 5-6 cigarettes a day. We discussed quitting and she is going to try on her own. We will obtained PFT's. On recent CBC absolute eos 246 cell/ul  -intermediate range ,  can consider adding ICS in the future after obtaining PFTs to further evaluate lungs.   Plan: PFT's - Continue Stiolto Resptimat 2 puff daily - Continue Albuterol as needed

## 2020-03-04 NOTE — Progress Notes (Signed)
Internal Medicine Clinic Attending  Case discussed with Dr. Steen  At the time of the visit.  We reviewed the resident's history and exam and pertinent patient test results.  I agree with the assessment, diagnosis, and plan of care documented in the resident's note.  

## 2020-03-07 ENCOUNTER — Telehealth: Payer: Self-pay

## 2020-03-07 NOTE — Telephone Encounter (Signed)
Received TC from patient asking for directions to [place], [place] for her preadmission Covid testing.  Patient states she was told to go to this location for her Covid test tomorrow and does not know how to get there.  RN attempted to give patient directions to site, patient states she is unfamiliar with [place].  Pt states her daughter will be home this evening and has a computer.  RN advised patient to have daughter pull up directions on google maps and they both study directions to site, she verbalized understanding and states she will try to have her daughter take her and or loan her a tablet with [employer]. SChaplin, RN,BSN

## 2020-03-08 ENCOUNTER — Other Ambulatory Visit (HOSPITAL_COMMUNITY)
Admission: RE | Admit: 2020-03-08 | Discharge: 2020-03-08 | Disposition: A | Discharge status: Home / Self Care | Payer: Medicare Other | Source: Ambulatory Visit | Attending: Internal Medicine | Admitting: Internal Medicine

## 2020-03-08 ENCOUNTER — Ambulatory Visit: Payer: Medicare Other | Admitting: *Deleted

## 2020-03-08 ENCOUNTER — Telehealth: Payer: Medicare Other

## 2020-03-08 DIAGNOSIS — R0602 Shortness of breath: Secondary | ICD-10-CM

## 2020-03-08 DIAGNOSIS — I10 Essential (primary) hypertension: Secondary | ICD-10-CM

## 2020-03-08 DIAGNOSIS — Z20822 Contact with and (suspected) exposure to covid-19: Secondary | ICD-10-CM | POA: Insufficient documentation

## 2020-03-08 DIAGNOSIS — Z01812 Encounter for preprocedural laboratory examination: Secondary | ICD-10-CM | POA: Diagnosis not present

## 2020-03-08 DIAGNOSIS — I5042 Chronic combined systolic (congestive) and diastolic (congestive) heart failure: Secondary | ICD-10-CM

## 2020-03-08 DIAGNOSIS — E785 Hyperlipidemia, unspecified: Secondary | ICD-10-CM

## 2020-03-08 LAB — SARS CORONAVIRUS 2 (TAT 6-24 HRS): SARS Coronavirus 2: NEGATIVE

## 2020-03-08 NOTE — Chronic Care Management (AMB) (Signed)
Chronic Care Management   Follow Up Note   03/08/2020 Name: Kim Anthony [name]: [id number] DOB: June 17, 1951  Referred by: Marianna Payment, MD Reason for referral : Chronic Care Management (COPD, Asthma, HF, HTN, HLD)   Kim Anthony is a 69 y.o. year old female who is a primary care patient of Marianna Payment, MD. The CCM team was consulted for assistance with chronic disease management and care coordination needs.    Review of patient status, including review of consultants reports, relevant laboratory and other test results, and collaboration with appropriate care team members and the patient's provider was performed as part of comprehensive patient evaluation and provision of chronic care management services.    SDOH (Social Determinants of Health) assessments performed: No See Care Plan activities for detailed interventions related to [facility])     Outpatient Encounter Medications as of 03/08/2020  Medication Sig Note  . albuterol (PROVENTIL) (2.5 MG/3ML) 0.083% nebulizer solution Take 3 mLs (2.5 mg total) by nebulization every 6 (six) hours as needed for wheezing or shortness of breath. 11/28/2019: Uses 3 times week during high pollen  . albuterol (VENTOLIN HFA) 108 (90 Base) MCG/ACT inhaler Inhale 1-2 puffs into the lungs every 6 (six) hours as needed for wheezing or shortness of breath. 11/28/2019: Uses twice daily  . Calcium-Phosphorus-Vitamin D (CITRACAL +D3 PO) Take 1 tablet by mouth 2 (two) times a day.   . fluticasone (FLONASE) 50 MCG/ACT nasal spray Place 2 sprays into both nostrils daily. (Patient taking differently: Place 1 spray into both nostrils daily as needed for allergies. )   . furosemide (LASIX) 40 MG tablet Take 1 tablet (40 mg total) by mouth 2 (two) times daily.   [name] gabapentin (NEURONTIN) 600 MG tablet Take 1 tablet (600 mg total) by mouth 4 (four) times daily. X 4 days, then go to 1200 mg 3x/day. For nerve pain 11/28/2019: Patient states she takes medication, sometimes more than  prescribed because the current dose is ineffective in treating her back pain  . HYDROcodone-acetaminophen (NORCO) 5-325 MG tablet Take 1 tablet by mouth every 8 (eight) hours as needed for moderate pain.   [name] lisinopril (ZESTRIL) 20 MG tablet Take 1 tablet by mouth once daily   . metoprolol succinate (TOPROL-XL) 25 MG 24 hr tablet Take 1 tablet (25 mg total) by mouth daily.   . potassium chloride SA (KLOR-CON) 20 MEQ tablet Take 2 tablets (40 mEq total) by mouth daily.   . pregabalin (LYRICA) 100 MG capsule Take 1 capsule (100 mg total) by mouth 3 (three) times daily.   . simvastatin (ZOCOR) 20 MG tablet Take 1 tablet by mouth once daily   . Spacer/Aero-Holding Chambers (BREATHERITE COLL SPACER ADULT) MISC 1 applicator by Does not apply route daily.   . Tiotropium Bromide-Olodaterol (STIOLTO RESPIMAT) 2.5-2.5 MCG/ACT AERS Inhale 2 puffs into the lungs daily.   [name] warfarin (COUMADIN) 5 MG tablet TAKE 1 TABLET BY MOUTH ONCE DAILY AT  4  PM    No facility-administered encounter medications on file as of 03/08/2020.     Objective:  BP Readings from Last 3 Encounters:  02/28/20 108/65  02/16/20 100/60  02/15/20 123/60   Wt Readings from Last 3 Encounters:  02/28/20 180 lb 12.8 oz (82 kg)  02/28/20 180 lb (81.6 kg)  02/15/20 176 lb 12.8 oz (80.2 kg)   Lab Results  Component Value Date   CHOL 177 04/14/2018   HDL 62 04/14/2018   LDLCALC 96 04/14/2018   TRIG 97 04/14/2018  CHOLHDL 2.9 04/14/2018  please consider updating lipid profile- she is on statin therapy  Goals Addressed              This Visit's Progress     Patient Stated   .  COMPLETED: "I'm trying to get to the Covid testing site on [address] and I can't find it." (pt-stated)        CARE PLAN ENTRY (see longitudinal plan of care for additional care plan information)  Current Barriers:  . Patient needs Covid test in order to have PFT's on 03/11/20- called patient to complete follow up assessment, she states she is  driving to the Covid testing site in [place] but can't find it  Nurse Case Manager Clinical Goal(s):  . Patent will successfully complete Covid testing today at designated site.  Interventions:  . Inter-disciplinary care team collaboration (see longitudinal plan of care)- [place] to get specific landmarks for the [facility] testing site but they were unable to assist, called [facility] clinic phlebotomist and Howell Rucks clinic staff RN to assist with identifying landmarks on [place] for testing site.  [name] Spoke with patient via phone providing directions until she successfully located testing site . Epic review indicates patient completed Covid Testing.   Patient Self Care Activities:  . Patient verbalizes understanding of plan to stay on phone with CCM RN for assistance in finding Covid testing site on [address] . Unable to independently locate [place] testing site to complete test in order to proceed with PFT's on 03/11/20  Initial goal documentation         Plan:   The care management team will reach out to the patient again over the next 30-60 days.    Kelli Churn RN, CCM, [facility] RN Care Manager [phone]

## 2020-03-08 NOTE — Progress Notes (Signed)
Internal Medicine Clinic Resident  I have personally reviewed this encounter including the documentation in this note and/or discussed this patient with the care management provider. I will address any urgent items identified by the care management provider and will communicate my actions to the patient's PCP. I have reviewed the patient's CCM visit with my supervising attending, Dr Hoffman.  Matt Devyne Hauger, MD 03/08/2020   

## 2020-03-11 ENCOUNTER — Other Ambulatory Visit: Payer: Self-pay

## 2020-03-11 ENCOUNTER — Ambulatory Visit (HOSPITAL_COMMUNITY)
Admission: RE | Admit: 2020-03-11 | Discharge: 2020-03-11 | Disposition: A | Discharge status: Home / Self Care | Payer: Medicare Other | Source: Ambulatory Visit | Attending: Internal Medicine | Admitting: Internal Medicine

## 2020-03-11 DIAGNOSIS — J449 Chronic obstructive pulmonary disease, unspecified: Secondary | ICD-10-CM | POA: Insufficient documentation

## 2020-03-11 LAB — PULMONARY FUNCTION TEST
DL/VA % pred: 65 %
DL/VA: 2.69 ml/min/mmHg/L
DLCO unc % pred: 35 %
DLCO unc: 7.1 ml/min/mmHg
FEF 25-75 Post: 1.09 L/sec
FEF 25-75 Pre: 1.39 L/sec
FEF2575-%Change-Post: -21 %
FEF2575-%Pred-Post: 61 %
FEF2575-%Pred-Pre: 78 %
FEV1-%Change-Post: -5 %
FEV1-%Pred-Post: 63 %
FEV1-%Pred-Pre: 66 %
FEV1-Post: 1.21 L
FEV1-Pre: 1.28 L
FEV1FVC-%Change-Post: -9 %
FEV1FVC-%Pred-Pre: 106 %
FEV6-%Change-Post: 5 %
FEV6-%Pred-Post: 68 %
FEV6-%Pred-Pre: 64 %
FEV6-Post: 1.63 L
FEV6-Pre: 1.55 L
FEV6FVC-%Pred-Post: 103 %
FEV6FVC-%Pred-Pre: 103 %
FVC-%Change-Post: 5 %
FVC-%Pred-Post: 65 %
FVC-%Pred-Pre: 62 %
FVC-Post: 1.63 L
FVC-Pre: 1.55 L
Post FEV1/FVC ratio: 75 %
Post FEV6/FVC ratio: 100 %
Pre FEV1/FVC ratio: 83 %
Pre FEV6/FVC Ratio: 100 %

## 2020-03-11 MED ORDER — ALBUTEROL SULFATE (2.5 MG/3ML) 0.083% IN NEBU
2.5000 mg | INHALATION_SOLUTION | Freq: Once | RESPIRATORY_TRACT | Status: AC
  Administered 2020-03-11: 2.5 mg via RESPIRATORY_TRACT

## 2020-03-13 ENCOUNTER — Other Ambulatory Visit: Payer: Self-pay | Admitting: Internal Medicine

## 2020-03-14 ENCOUNTER — Telehealth: Payer: Medicare Other

## 2020-03-18 ENCOUNTER — Telehealth: Payer: Self-pay | Admitting: Internal Medicine

## 2020-03-18 NOTE — Telephone Encounter (Signed)
Pls contact pt regarding pain in wrist [phone]

## 2020-03-18 NOTE — Telephone Encounter (Signed)
Returned call to patient. States she saw an ortho doc for her wrist pain in April but cannot remember his name. Phone number [phone]) to Dr. Berle Mull provided to patient. She will call his office now to sched appt to be evaluated. Hubbard Hartshorn, BSN, RN-BC

## 2020-03-19 ENCOUNTER — Ambulatory Visit (INDEPENDENT_AMBULATORY_CARE_PROVIDER_SITE_OTHER): Payer: Medicare Other | Admitting: Internal Medicine

## 2020-03-19 ENCOUNTER — Encounter: Payer: Self-pay | Admitting: Internal Medicine

## 2020-03-19 ENCOUNTER — Other Ambulatory Visit: Payer: Self-pay

## 2020-03-19 VITALS — BP 138/95 | HR 73 | Temp 98.2°F | Ht 65.0 in | Wt 181.1 lb

## 2020-03-19 DIAGNOSIS — M13139 Monoarthritis, not elsewhere classified, unspecified wrist: Secondary | ICD-10-CM | POA: Diagnosis not present

## 2020-03-19 DIAGNOSIS — M79641 Pain in right hand: Secondary | ICD-10-CM | POA: Diagnosis not present

## 2020-03-19 DIAGNOSIS — M25531 Pain in right wrist: Secondary | ICD-10-CM | POA: Diagnosis not present

## 2020-03-19 MED ORDER — KETOROLAC TROMETHAMINE 30 MG/ML IJ SOLN
30.0000 mg | Freq: Once | INTRAMUSCULAR | Status: AC
  Administered 2020-03-19: 30 mg via INTRAMUSCULAR

## 2020-03-19 NOTE — Progress Notes (Signed)
Internal Medicine Clinic Attending  I saw and evaluated the patient.  I personally confirmed the key portions of the history and exam documented by Dr. Gilford Rile and I reviewed pertinent patient test results.  The assessment, diagnosis, and plan were formulated together and I agree with the documentation in the resident's note. Most expeditious care plan today in order to quickly establish diagnosis via aspiration for this inflammatory monoarticular arthritis is referral to a hand orthopedist or rheumatologist - able to get her in this afternoon. THe chronicity of her pain and swelling favors a non infectious process, though there is certainly inflammation and a low grade septic joint could present this way (vs gout, pseudogout, other inflammation).  No calcification of cartilage in wrist plain films noted with prior imaging several months ago. Toradol for pain while awaiting this afternoon's referral. Previous blood tests reviewed and are reassuring.

## 2020-03-19 NOTE — Assessment & Plan Note (Addendum)
Patient presents to the clinic today for acute pain in her R wrists. She states that this Sunday morning, she woke up with pain in her wrist that goes up to her arm. She describes the pain as an 8/10 in scale, and "burning and sharp" in character. She has been taking her Vicodin with little relief.   On physical examination, patient is teary and in acute distress. Her RUE is swollen, erythematous, and tender to palpation. No Tinel sign present. Unable to extend or flex her R wrist secondary to pain. There is an effusion present on the dorsal aspect of her R hand.   She states that this has occurred once before in April, and she was given prednisone which relieved her symptoms. Upon chart review, she did have a follow up in our clinic and was given a small dose of narcotics. She was seen earlier in April for the same pain and was given Vicodin.   Given her presentation the differential is broad, including gout/pseudogout, infection, rheumatological, or possible metastasis (mediastinal lymphadenopathy noted in 2018 CT scan). Given the timeframe, infectious etiology would likely have progressed over the last 48 hours, but she does have an effusion that should be tapped for fluid analysis. Patient scheduled to see the Orthopaedist 10/6. Will reach out today to see if she can be acutely seen for wrist arthrocentesis.  - Reach out to her orthopaedist today for arthrocentesis.  - Toradol 30mg  injection

## 2020-03-19 NOTE — Progress Notes (Signed)
   CC: R Wrist Pain   HPI:  [name] is a 69 y.o. female, with a PMH noted below, who presents to the clinic for Right wrist pain. To see the management of their acute and chronic conditions, please see the A&P note under the Encounters tab.   Past Medical History:  Diagnosis Date  . Arthritis   . Asthma   . Breast cancer ([place]) 2001   Left Breast Cancer  . CHF (congestive heart failure) ([place])   . COPD (chronic obstructive pulmonary disease) ([place])   . Coronary artery disease   . History of mitral valve replacement with mechanical valve   . HLD (hyperlipidemia)   . Hypertension   . Pulmonary embolism ([place])    Review of Systems:   Review of Systems  Constitutional: Negative for chills, fever and malaise/fatigue.  Respiratory: Negative for cough.   Cardiovascular: Negative for chest pain, palpitations, orthopnea, claudication and leg swelling.  Gastrointestinal: Negative for abdominal pain, blood in stool, diarrhea, nausea and vomiting.  Musculoskeletal: Positive for joint pain. Negative for myalgias and neck pain.       Wrist pain with radiation to the shoulder.   Neurological: Negative for dizziness, tingling and headaches.    Physical Exam:  Vitals:   03/19/20 0906  BP: (!) 138/95  Pulse: 73  Temp: 98.2 F (36.8 C)  SpO2: 100%  Weight: 181 lb 1.6 oz (82.1 kg)  Height: 5\' 5"  (1.651 m)   Physical Exam Vitals reviewed.  Constitutional:      General: She is in acute distress.     Comments: Patient appears in acute distress, teary, answers questions appropriately.   HENT:     Head: Normocephalic.  Cardiovascular:     Rate and Rhythm: Normal rate and regular rhythm.     Pulses: Normal pulses.     Heart sounds: Normal heart sounds.  Pulmonary:     Effort: Pulmonary effort is normal.     Breath sounds: Normal breath sounds. No wheezing, rhonchi or rales.  Chest:     Chest wall: No tenderness.  Abdominal:     General: Abdomen is flat. Bowel sounds are normal.       Palpations: Abdomen is soft.  Musculoskeletal:        General: Swelling and tenderness present.     Comments: Patient's R wrist initially covered with wrist splint. Upon removal of splint, R wrist and hand is moderately swollen, erythematous, and tenderness to palpation on the dorsal aspect of the R hand. Effusion noted on the dorsal aspect of the R wrist. Unable to extend/flex wrist 2/2 to pain. varicose veins appreciated on the palmar aspect of the R wrist.   Neurological:     Mental Status: She is alert.     Assessment & Plan:   See Encounters Tab for problem based charting.  Patient discussed with Dr. Jimmye Norman

## 2020-03-19 NOTE — Patient Instructions (Signed)
To Kim Anthony,  Today we discussed your wrist pain. We have referred you to a orthopedic doctor to examine your wrist today at 2:00 p.m. They may draw fluid from your wrist at today's visit to analyze the fluid. Have a good day!  Sincerely,  Maudie Mercury, MD

## 2020-03-21 ENCOUNTER — Ambulatory Visit: Payer: Medicare Other | Admitting: *Deleted

## 2020-03-21 DIAGNOSIS — I10 Essential (primary) hypertension: Secondary | ICD-10-CM

## 2020-03-21 DIAGNOSIS — M13139 Monoarthritis, not elsewhere classified, unspecified wrist: Secondary | ICD-10-CM

## 2020-03-21 DIAGNOSIS — I5042 Chronic combined systolic (congestive) and diastolic (congestive) heart failure: Secondary | ICD-10-CM

## 2020-03-21 DIAGNOSIS — E785 Hyperlipidemia, unspecified: Secondary | ICD-10-CM

## 2020-03-21 NOTE — Chronic Care Management (AMB) (Signed)
Chronic Care Management   Follow Up Note   03/21/2020 Name: Kim Anthony [name]: [id number] DOB: [birth year]  Referred by: Marianna Payment, MD Reason for referral : Chronic Care Management (COPD, Asthma, HF, HTN, HLD, on Coumadin r/t MVR)   Kim Anthony is a 69 y.o. year old female who is a primary care patient of Marianna Payment, MD. The CCM team was consulted for assistance with chronic disease management and care coordination needs.    Review of patient status, including review of consultants reports, relevant laboratory and other test results, and collaboration with appropriate care team members and the patient's provider was performed as part of comprehensive patient evaluation and provision of chronic care management services.    SDOH (Social Determinants of Health) assessments performed: No See Care Plan activities for detailed interventions related to Circles Of Care)     Outpatient Encounter Medications as of 03/21/2020  Medication Sig Note   albuterol (PROVENTIL) (2.5 MG/3ML) 0.083% nebulizer solution Take 3 mLs (2.5 mg total) by nebulization every 6 (six) hours as needed for wheezing or shortness of breath. 11/28/2019: Uses 3 times week during high pollen   albuterol (VENTOLIN HFA) 108 (90 Base) MCG/ACT inhaler Inhale 1-2 puffs into the lungs every 6 (six) hours as needed for wheezing or shortness of breath. 11/28/2019: Uses twice daily   Calcium-Phosphorus-Vitamin D (CITRACAL +D3 PO) Take 1 tablet by mouth 2 (two) times a day.    fluticasone (FLONASE) 50 MCG/ACT nasal spray Place 2 sprays into both nostrils daily. (Patient taking differently: Place 1 spray into both nostrils daily as needed for allergies. )    furosemide (LASIX) 40 MG tablet Take 1 tablet (40 mg total) by mouth 2 (two) times daily.    gabapentin (NEURONTIN) 600 MG tablet Take 1 tablet (600 mg total) by mouth 4 (four) times daily. X 4 days, then go to 1200 mg 3x/day. For nerve pain 03/21/2020: Takes 1200 mg three times daily    HYDROcodone-acetaminophen (NORCO) 5-325 MG tablet Take 1 tablet by mouth every 8 (eight) hours as needed for moderate pain. 03/21/2020: Currently taking every 4 hours due to acute right wrist pain   lisinopril (ZESTRIL) 20 MG tablet Take 1 tablet by mouth once daily    metoprolol succinate (TOPROL-XL) 25 MG 24 hr tablet Take 1 tablet (25 mg total) by mouth daily.    potassium chloride SA (KLOR-CON) 20 MEQ tablet Take 2 tablets (40 mEq total) by mouth daily.    predniSONE (DELTASONE) 10 MG tablet Take 10 mg by mouth daily with breakfast.    pregabalin (LYRICA) 100 MG capsule Take 1 capsule (100 mg total) by mouth 3 (three) times daily.    simvastatin (ZOCOR) 20 MG tablet Take 1 tablet by mouth once daily    Spacer/Aero-Holding Chambers (BREATHERITE COLL SPACER ADULT) MISC 1 applicator by Does not apply route daily.    STIOLTO RESPIMAT 2.5-2.5 MCG/ACT AERS INHALE 2 PUFFS BY MOUTH ONCE DAILY    warfarin (COUMADIN) 5 MG tablet TAKE 1 TABLET BY MOUTH ONCE DAILY AT  4  PM    No facility-administered encounter medications on file as of 03/21/2020.     Objective:  Wt Readings from Last 3 Encounters:  03/19/20 181 lb 1.6 oz (82.1 kg)  02/28/20 180 lb 12.8 oz (82 kg)  02/28/20 180 lb (81.6 kg)   BP Readings from Last 3 Encounters:  03/19/20 (!) 138/95  02/28/20 108/65  02/16/20 100/60   Lab Results  Component Value Date   CHOL 177 04/14/2018  HDL 62 04/14/2018   LDLCALC 96 04/14/2018   TRIG 97 04/14/2018   CHOLHDL 2.9 04/14/2018  Please update lipid panel at next clinic visit ; patient is on statin therapy  Goals Addressed              This Visit's Progress     Patient Stated     "I want my pain management doctor to know that I am taking more pain medicine because of my wrist pain." (pt-stated)        CARE PLAN ENTRY (see longitudinal plan of care for additional care plan information)  Current Barriers:   Care Coordination needs related to acute pain management  concerns in a patient with COPD, Asthma, HF, HTN, HLD, on Coumadin r/t MVR, chronic pain syndrome on opioids- patient states she was seen in the clinic for an acute visit on 9/28 for severe right wrist pain. She says she was given a pain shot (Toradol) and then referred to an orthopedic provider that afternoon. She says the orthopedist prescribed prednisone and offered to prescribe a pain medicine but she told him she was in a pain management program and could not received pain medicine from other providers, she says she is taking oxycodone every 4 hours instead of every 8 to treat the wrist pain and she is asking this CCM RN to advise Dr Dagoberto Ligas since she will need a refill sooner  Nurse Case Manager Clinical Goal(s):   Over the next 30-60 days, patient will verbalize understanding of plan for CCM RN to notify pain management provider that she has required increased doses of opioids to treat acute wrist pian  Interventions:   Inter-disciplinary care team collaboration (see longitudinal plan of care)  Advised patient  this CCM RN will notify Dr Dagoberto Ligas of her increase in frequency of opioid from every 8 hrs to every 4 hours to treat acute wrist pain  Messaged Dr Dagoberto Ligas via Epic per patient request of need for increased opioid use to treat acute wrist pain   Patient Self Care Activities:   Patient verbalizes understanding of plan for CCM RN to communicate with Dr Dagoberto Ligas  Initial goal documentation       COMPLETED: "I'm trying to get to the Covid testing site on [facility] and I can't find it." (pt-stated)        CARE PLAN ENTRY (see longitudinal plan of care for additional care plan information)  Current Barriers:   Patient needs Covid test in order to have PFT's on 03/11/20- called patient to complete follow up assessment, she states she completed her Covid test on 9/17 and was able to get home safely with out problems, she voiced appreciation for this CCM RN's help  Nurse Case Manager  Clinical Goal(s):   Patent will successfully complete Covid testing today at designated site.  Interventions:   Inter-disciplinary care team collaboration (see longitudinal plan of care)- [place] to get specific landmarks for the [facility] testing site but they were unable to assist, called [facility] clinic phlebotomist and [facility] clinic staff RN to assist with identifying landmarks on [place] for testing site.   Spoke with patient via phone providing directions until she successfully located testing site  Epic review indicates patient completed Covid Testing.   Patient Self Care Activities:   Patient verbalizes understanding of plan to stay on phone with CCM RN for assistance in finding Covid testing site on [place] [address]  Unable to independently locate [place] Covid testing site to complete test in order to proceed with PFT's on 03/11/20  Please see past updates related to this goal by clicking on the "Past Updates" button in the selected goal        I'm taking all my medications like I'm supposed to." (pt-stated)        [place] (see longitudinal plan of care for additional care plan information)  Current Barriers:   Chronic Disease Management support, education, and care coordination needs related to CHF, CAD, HTN, HLD, COPD, and Asthma - reached patient via phone successfully and follow up assessment completed, she denies difficulty with breathing and uses her nebulizer 3 times weekly, denies nocturnal dyspnea, she reports good medication taking behavior. Says she is not using her INR machine even though she now has all the supplies she needs to do home testing, says she goes to the clinic for INR testing. Says she continues to receive visits from [facility] as a participant in their [place] Goal(s) related to CHF, CAD, HTN, HLD, COPD, and asthma :  Over the next 30 - 60 days,  patient will:   Work with the care management team to address educational, disease management, and care coordination needs   Begin or continue self health monitoring activities as directed today Measure and record blood pressure 1-2 times per week and as needed  when you are able to get a home BP monitor  Call provider office for new or worsened signs and symptoms of CHF, HTN, HLD, COPD, and Asthma  Call care management team with questions or concerns  Verbalize basic understanding of patient centered plan of care established today  Interventions related to CHF, CAD, HTN, HLD, COPD, and Asthma :   Evaluation of current treatment plans and patient's adherence to plan as established by provider  Reviewed the importance INR monitoring while on Coumadin either via home testing or at the clinic  Assessed patient's education and care coordination needs  Verified with patient that she continues to receive home based primary care via [facility] - per chart review most recent home visit was 02/26/20 with vitals recorded as: 140/88, 68, 20, room air saturation= 95%, temp= 97.4, weight= 182.0 lbs  Collaborated with appropriate clinical care team members regarding patient needs   Patient Self Care Activities related to CHF, HTN, HLD, COPD, and Asthma :   Patient is unable to independently self-manage chronic health conditions  Please see past updates related to this goal by clicking on the "Past Updates" button in the selected goal          Plan:   The care management team will reach out to the patient again over the next 30-60 days.    Kelli Churn RN, CCM, [facility] RN Care Manager [phone]

## 2020-03-21 NOTE — Progress Notes (Signed)
Internal Medicine Clinic Attending  CCM services provided by the care management provider and their documentation were discussed with Dr. Eileen Stanford. We reviewed the pertinent findings, urgent action items addressed by the resident and non-urgent items to be addressed by the PCP.  I agree with the assessment, diagnosis, and plan of care documented in the CCM and resident's note.  Aldine Contes, MD 03/21/2020

## 2020-03-21 NOTE — Progress Notes (Signed)
Internal Medicine Clinic Resident  I have personally reviewed this encounter including the documentation in this note and/or discussed this patient with the care management provider. I will address any urgent items identified by the care management provider and will communicate my actions to the patient's PCP. I have reviewed the patient's CCM visit with my supervising attending, Dr Dareen Piano.  Jean Rosenthal, MD 03/21/2020

## 2020-03-21 NOTE — Patient Instructions (Signed)
Visit Information It was nice speaking with you today. I will let Dr Dagoberto Ligas know you have increased your use of prescription pain medicine to treat your right wrist pain.  Goals Addressed              This Visit's Progress     Patient Stated   .  "I want my pain management doctor to know that I am taking more pain medicine because of my wrist pain." (pt-stated)        CARE PLAN ENTRY (see longitudinal plan of care for additional care plan information)  Current Barriers:  . Care Coordination needs related to acute pain management concerns in a patient with COPD, Asthma, HF, HTN, HLD, on Coumadin r/t MVR, chronic pain syndrome on opioids- patient states she was seen in the clinic for an acute visit on 9/28 for severe right wrist pain. She says she was given a pain shot (Toradol) and then referred to an orthopedic provider that afternoon. She says the orthopedist prescribed prednisone 10 mg and offered to prescribe a pain medicine but she told him she was in a pain management program and could not received pain medicine from other providers, she says she is taking oxycodone every 4 hours instead of every 8 to treat the wrist pain and she is asking this CCM RN to advise Dr Dagoberto Ligas since she will need a refill sooner  Nurse Case Manager Clinical Goal(s):  [name] Over the next 30-60 days, patient will verbalize understanding of plan for CCM RN to notify pain management provider that she has required increased doses of opioids to treat acute wrist pian  Interventions:  . Inter-disciplinary care team collaboration (see longitudinal plan of care) . Advised patient  this CCM RN will notify Dr Dagoberto Ligas of her increase in frequency of opioid from every 8 hrs to every 4 hours to treat acute wrist pain . Messaged Dr Dagoberto Ligas via Epic per patient request of need for increased opioid use to treat acute wrist pain  Patient Self Care Activities:  . Patient verbalizes understanding of plan for CCM RN to communicate  with Dr Dagoberto Ligas  Initial goal documentation     .  COMPLETED: "I'm trying to get to the Covid testing site on [facility] and I can't find it." (pt-stated)        CARE PLAN ENTRY (see longitudinal plan of care for additional care plan information)  Current Barriers:  . Patient needs Covid test in order to have PFT's on 03/11/20- called patient to complete follow up assessment, she states she completed her Covid test on 9/17 and was able to get home safely with out problems, she voiced appreciation for this CCM RN's help  Nurse Case Manager Clinical Goal(s):  . [place] will successfully complete Covid testing today at designated site.  Interventions:  . Inter-disciplinary care team collaboration (see longitudinal plan of care)- [employer] to get specific landmarks for the [facility] testing site but they were unable to assist, called [facility] clinic phlebotomist and [facility] clinic staff RN to assist with identifying landmarks on [place] for testing site.  [name] Spoke with patient via phone providing directions until she successfully located testing site . Epic review indicates patient completed Covid Testing.   Patient Self Care Activities:  . Patient verbalizes understanding of plan to stay on phone with CCM RN for assistance in finding Covid testing site on [employer] . Unable to independently locate [place] testing  site to complete test in order to proceed with PFT's on 03/11/20  Please see past updates related to this goal by clicking on the "Past Updates" button in the selected goal      .  I'm taking all my medications like I'm supposed to." (pt-stated)        [place] (see longitudinal plan of care for additional care plan information)  Current Barriers:  . Chronic Disease Management support, education, and care coordination needs related to CHF, CAD, HTN, HLD, COPD, and Asthma - reached patient via phone  successfully and follow up assessment completed, she denies difficulty with breathing and uses her nebulizer 3 times weekly, denies nocturnal dyspnea, she reports good medication taking behavior. Says she is not using her INR machine even though she now has all the supplies she needs to do home testing, says she goes to the clinic for INR testing. Says she continues to receive visits from [facility] as a participant in their [place] Goal(s) related to CHF, CAD, HTN, HLD, COPD, and asthma :  Over the next 30 - 60 days, patient will:  . Work with the care management team to address educational, disease management, and care coordination needs  . Begin or continue self health monitoring activities as directed today Measure and record blood pressure 1-2 times per week and as needed  when you are able to get a home BP monitor . Call provider office for new or worsened signs and symptoms of CHF, HTN, HLD, COPD, and Asthma . Call care management team with questions or concerns . Verbalize basic understanding of patient centered plan of care established today  Interventions related to CHF, CAD, HTN, HLD, COPD, and Asthma :  . Evaluation of current treatment plans and patient's adherence to plan as established by provider . Reviewed the importance INR monitoring while on Coumadin either via home testing or at the clinic . Assessed patient's education and care coordination needs . Verified with patient that she continues to receive home based primary care via [facility] - per chart review most recent home visit was 02/26/20 with vitals recorded as: 140/88, 68, 20, room air saturation= 95%, temp= 97.4, weight= 182.0 lbs . Collaborated with appropriate clinical care team members regarding patient needs   Patient Self Care Activities related to CHF, HTN, HLD, COPD, and Asthma :  . Patient is unable to independently self-manage chronic health conditions  Please see past  updates related to this goal by clicking on the "Past Updates" button in the selected goal         The patient verbalized understanding of instructions provided today and declined a print copy of patient instruction materials.   The care management team will reach out to the patient again over the next 30-60 days.   Kelli Churn RN, CCM, [facility] RN Care Manager [phone]

## 2020-03-27 ENCOUNTER — Telehealth: Payer: Self-pay

## 2020-03-27 NOTE — Telephone Encounter (Signed)
Patient called to remind you to send in her refill.

## 2020-03-28 MED ORDER — HYDROCODONE-ACETAMINOPHEN 5-325 MG PO TABS
1.0000 | ORAL_TABLET | Freq: Three times a day (TID) | ORAL | 0 refills | Status: DC | PRN
Start: 2020-03-28 — End: 2020-04-30

## 2020-03-28 NOTE — Telephone Encounter (Signed)
Sent in [place] #90- TID 5/325 mg refill for pt.

## 2020-04-01 ENCOUNTER — Other Ambulatory Visit: Payer: Self-pay | Admitting: Internal Medicine

## 2020-04-01 DIAGNOSIS — R0602 Shortness of breath: Secondary | ICD-10-CM

## 2020-04-17 ENCOUNTER — Other Ambulatory Visit: Payer: Self-pay | Admitting: Internal Medicine

## 2020-04-18 ENCOUNTER — Ambulatory Visit: Payer: Medicare Other | Admitting: *Deleted

## 2020-04-18 DIAGNOSIS — E785 Hyperlipidemia, unspecified: Secondary | ICD-10-CM

## 2020-04-18 DIAGNOSIS — I10 Essential (primary) hypertension: Secondary | ICD-10-CM

## 2020-04-18 DIAGNOSIS — I5042 Chronic combined systolic (congestive) and diastolic (congestive) heart failure: Secondary | ICD-10-CM

## 2020-04-18 NOTE — Progress Notes (Signed)
Internal Medicine Clinic Resident  I have personally reviewed this encounter including the documentation in this note and/or discussed this patient with the care management provider. I will address any urgent items identified by the care management provider and will communicate my actions to the patient's PCP. I have reviewed the patient's CCM visit with my supervising attending, Dr Evette Doffing.  Harvie Heck, MD  [facility] PGY-2 04/18/2020

## 2020-04-18 NOTE — Patient Instructions (Signed)
Visit Information It was nice speaking with you today. Goals Addressed              This Visit's Progress     Patient Stated   .  COMPLETED: "I want my pain management doctor to know that I am taking more pain medicine because of my wrist pain." (pt-stated)        CARE PLAN ENTRY (see longitudinal plan of care for additional care plan information)  Current Barriers:  . Care Coordination needs related to acute pain management concerns in a patient with COPD, Asthma, HF, HTN, HLD, on Coumadin r/t MVR, chronic pain syndrome on opioids- Spoke with patient to complete follow up assessment, she states she had no problem getting her Norco refilled early due tio the need for the pain medicine for severe right wrist pain on last September, she said she had another flare of the pain recently and is wearing a wrist brace to decrease the pain, she says she has no follow up scheduled with the orthopedist unless she feels she needs to be seen again then she was instructed to call the office for follow up  Nurse Case Manager Clinical Goal(s):  [name] Over the next 30-60 days, patient will verbalize understanding of plan for CCM RN to notify pain management provider that she has required increased doses of opioids to treat acute wrist pian  Interventions:  . Inter-disciplinary care team collaboration (see longitudinal plan of care) . Assessed patient's ability to get early refill on Norco from her chronic pain management MD, Dr. Dagoberto Ligas  Patient Self Care Activities:  . Patient verbalizes understanding of plan for CCM RN to communicate with Dr Dagoberto Ligas  Please see past updates related to this goal by clicking on the "Past Updates" button in the selected goal      .  I'm taking all my medications like I'm supposed to." (pt-stated)        [place] (see longitudinal plan of care for additional care plan information)  Current Barriers:  . Chronic Disease Management support, education, and care  coordination needs related to CHF, CAD, HTN, HLD, COPD, and Asthma - reached patient via phone successfully and follow up assessment completed, she says she has a recent asthma flare that she attributes to going outside when it was very windy, she says the use of her nebulizer was successful in treating the exacerbation, denies difficulty with breathing now and uses her nebulizer 3 times weekly, denies nocturnal dyspnea, she reports good medication taking behavior. Says she is not using her INR machine even though she now has all the supplies she needs to do home testing, she says her daughter "sticks her finger because I can't" but her INR has not been checked at home for quite a while, she says she went to the clinic for INR testing the last time it was checked,. She states she took her last Coumadin pill last night and called her pharmacy for refills, she also says she needs a refill on her Flonase. Says she continues to receive visits ( the last one was virtual) from [facility] as a participant in their [place] Goal(s) related to CHF, CAD, HTN, HLD, COPD, and asthma :  Over the next 30 - 60 days, patient will:  . Work with the care management team to address educational, disease management, and care coordination needs  . Begin or continue self health monitoring activities as directed today Measure and record  blood pressure 1-2 times per week and as needed  when you are able to get a home BP monitor, monitor O2 saturations when work of breathing changes from baseline . Call provider office for new or worsened signs and symptoms of CHF, HTN, HLD, COPD, and Asthma . Call care management team with questions or concerns . Verbalize basic understanding of patient centered plan of care established today  Interventions related to CHF, CAD, HTN, HLD, COPD, and Asthma :  . Evaluation of current treatment plans and patient's adherence to plan as established by  provider . Reinforced the importance INR monitoring while on Coumadin either via home testing or at the clinic . Assessed patient's education and care coordination needs . Verified with patient that she continues to receive home based primary care via [facility] - per chart review most recent home visit was 03/27/20 via telehealth . Collaborated with appropriate clinical care team members regarding patient needs- will notify clinic providers she needs refill on Coumadin and Flonase   Patient Self Care Activities related to CHF, HTN, HLD, COPD, and Asthma :  . Patient is unable to independently self-manage chronic health conditions  Please see past updates related to this goal by clicking on the "Past Updates" button in the selected goal         The patient verbalized understanding of instructions provided today and declined a print copy of patient instruction materials.   The care management team will reach out to the patient again over the next 30-60 days.   Kelli Churn RN, CCM, [facility] RN Care Manager [phone]

## 2020-04-18 NOTE — Chronic Care Management (AMB) (Signed)
Chronic Care Management   Follow Up Note   04/18/2020 Name: Kim Anthony [name]: [id number] DOB: [birth year]  Referred by: Marianna Payment, MD Reason for referral : Chronic Care Management (COPD, Asthma, HF, HTN, HLD, on Coumadin r/t MVR, chronic pain syndrom)   Kim Anthony is a 69 y.o. year old female who is a primary care patient of Marianna Payment, MD. The CCM team was consulted for assistance with chronic disease management and care coordination needs.    Review of patient status, including review of consultants reports, relevant laboratory and other test results, and collaboration with appropriate care team members and the patient's provider was performed as part of comprehensive patient evaluation and provision of chronic care management services.    SDOH (Social Determinants of Health) assessments performed: No See Care Plan activities for detailed interventions related to [facility])     Outpatient Encounter Medications as of 04/18/2020  Medication Sig Note  . fluticasone (FLONASE) 50 MCG/ACT nasal spray Place 2 sprays into both nostrils daily. (Patient taking differently: Place 1 spray into both nostrils daily as needed for allergies. ) 04/18/2020: Requesting refill  . warfarin (COUMADIN) 5 MG tablet TAKE 1 TABLET BY MOUTH ONCE DAILY AT  4  PM 04/18/2020: States she needs refill ASAP- took last pill on 04/17/20  . albuterol (PROVENTIL) (2.5 MG/3ML) 0.083% nebulizer solution Take 3 mLs (2.5 mg total) by nebulization every 6 (six) hours as needed for wheezing or shortness of breath. 11/28/2019: Uses 3 times week during high pollen  . albuterol (VENTOLIN HFA) 108 (90 Base) MCG/ACT inhaler Inhale 1-2 puffs into the lungs every 6 (six) hours as needed for wheezing or shortness of breath. 11/28/2019: Uses twice daily  . Calcium-Phosphorus-Vitamin D (CITRACAL +D3 PO) Take 1 tablet by mouth 2 (two) times a day.   . furosemide (LASIX) 40 MG tablet Take 1 tablet by mouth twice daily   . gabapentin (NEURONTIN)  600 MG tablet Take 1 tablet (600 mg total) by mouth 4 (four) times daily. X 4 days, then go to 1200 mg 3x/day. For nerve pain 03/21/2020: Takes 1200 mg three times daily  . HYDROcodone-acetaminophen (NORCO) 5-325 MG tablet Take 1 tablet by mouth every 8 (eight) hours as needed for moderate pain.   [name] lisinopril (ZESTRIL) 20 MG tablet Take 1 tablet by mouth once daily   . metoprolol succinate (TOPROL-XL) 25 MG 24 hr tablet Take 1 tablet (25 mg total) by mouth daily.   . potassium chloride SA (KLOR-CON) 20 MEQ tablet Take 2 tablets (40 mEq total) by mouth daily.   . pregabalin (LYRICA) 100 MG capsule Take 1 capsule (100 mg total) by mouth 3 (three) times daily.   . simvastatin (ZOCOR) 20 MG tablet Take 1 tablet by mouth once daily   . Spacer/Aero-Holding Chambers (BREATHERITE COLL SPACER ADULT) MISC 1 applicator by Does not apply route daily.   [name] STIOLTO RESPIMAT 2.5-2.5 MCG/ACT AERS INHALE 2 PUFFS BY MOUTH ONCE DAILY    No facility-administered encounter medications on file as of 04/18/2020.      Objective:  BP Readings from Last 3 Encounters:  03/19/20 (!) 138/95  02/28/20 108/65  02/16/20 100/60   Wt Readings from Last 3 Encounters:  03/19/20 181 lb 1.6 oz (82.1 kg)  02/28/20 180 lb 12.8 oz (82 kg)  02/28/20 180 lb (81.6 kg)   Lab Results  Component Value Date   CHOL 177 04/14/2018   HDL 62 04/14/2018   LDLCALC 96 04/14/2018   TRIG 97 04/14/2018  CHOLHDL 2.9 04/14/2018  Consider updating lipid profile - on statin Rx  Goals Addressed              This Visit's Progress     Patient Stated   .  COMPLETED: "I want my pain management doctor to know that I am taking more pain medicine because of my wrist pain." (pt-stated)        CARE PLAN ENTRY (see longitudinal plan of care for additional care plan information)  Current Barriers:  . Care Coordination needs related to acute pain management concerns in a patient with COPD, Asthma, HF, HTN, HLD, on Coumadin r/t MVR, chronic  pain syndrome on opioids- Spoke with patient to complete follow up assessment, she states she had no problem getting her Norco refilled early due tio the need for the pain medicine for severe right wrist pain on last September, she said she had another flare of the pain recently and is wearing a wrist brace to decrease the pain, she says she has no follow up scheduled with the orthopedist unless she feels she needs to be seen again then she was instructed to call the office for follow up  Nurse Case Manager Clinical Goal(s):  [name] Over the next 30-60 days, patient will verbalize understanding of plan for CCM RN to notify pain management provider that she has required increased doses of opioids to treat acute wrist pian  Interventions:  . Inter-disciplinary care team collaboration (see longitudinal plan of care) . Assessed patient's ability to get early refill on Norco from her chronic pain management MD, Dr. Dagoberto Ligas  Patient Self Care Activities:  . Patient verbalizes understanding of plan for CCM RN to communicate with Dr Dagoberto Ligas  Please see past updates related to this goal by clicking on the "Past Updates" button in the selected goal      .  I'm taking all my medications like I'm supposed to." (pt-stated)        [place] (see longitudinal plan of care for additional care plan information)  Current Barriers:  . Chronic Disease Management support, education, and care coordination needs related to CHF, CAD, HTN, HLD, COPD, and Asthma - reached patient via phone successfully and follow up assessment completed, she says she has a recent asthma flare that she attributes to going outside when it was very windy, she says the use of her nebulizer was successful in treating the exacerbation, denies difficulty with breathing now and uses her nebulizer 3 times weekly, denies nocturnal dyspnea, she reports good medication taking behavior. Says she is not using her INR machine even though she now has all  the supplies she needs to do home testing, she says her daughter "sticks her finger because I can't" but her INR has not been checked at home for quite a while, she says she went to the clinic for INR testing the last time it was checked,. She states she took her last Coumadin pill last night and called her pharmacy for refills, she also says she needs a refill on her Flonase. Says she continues to receive visits ( the last one was virtual) from [facility] as a participant in their [place] Goal(s) related to CHF, CAD, HTN, HLD, COPD, and asthma :  Over the next 30 - 60 days, patient will:  . Work with the care management team to address educational, disease management, and care coordination needs  . Begin or continue self health monitoring activities as  directed today Measure and record blood pressure 1-2 times per week and as needed  when you are able to get a home BP monitor, monitor O2 saturations when work of breathing changes from baseline . Call provider office for new or worsened signs and symptoms of CHF, HTN, HLD, COPD, and Asthma . Call care management team with questions or concerns . Verbalize basic understanding of patient centered plan of care established today  Interventions related to CHF, CAD, HTN, HLD, COPD, and Asthma :  . Evaluation of current treatment plans and patient's adherence to plan as established by provider . Reinforced the importance INR monitoring while on Coumadin either via home testing or at the clinic . Assessed patient's education and care coordination needs . Verified with patient that she continues to receive home based primary care via [facility] - per chart review most recent home visit was 03/27/20 via telehealth . Collaborated with appropriate clinical care team members regarding patient needs- will notify clinic providers she needs refill on Coumadin and Flonase   Patient Self Care Activities related to CHF, HTN,  HLD, COPD, and Asthma :  . Patient is unable to independently self-manage chronic health conditions  Please see past updates related to this goal by clicking on the "Past Updates" button in the selected goal          Plan:   The care management team will reach out to the patient again over the next 30-60 days.    Kelli Churn RN, CCM, [facility] RN Care Manager [phone]

## 2020-04-18 NOTE — Progress Notes (Signed)
Internal Medicine Clinic Attending  CCM services provided by the care management provider and their documentation were discussed with Dr. Marva Panda. We reviewed the pertinent findings, urgent action items addressed by the resident and non-urgent items to be addressed by the PCP.  I agree with the assessment, diagnosis, and plan of care documented in the CCM and resident's note.  Axel Filler, MD 04/18/2020

## 2020-04-24 ENCOUNTER — Ambulatory Visit: Payer: Medicare Other | Attending: Family

## 2020-04-24 DIAGNOSIS — Z23 Encounter for immunization: Secondary | ICD-10-CM

## 2020-04-26 ENCOUNTER — Encounter: Payer: Medicare Other | Admitting: Internal Medicine

## 2020-04-30 ENCOUNTER — Telehealth: Payer: Self-pay | Admitting: *Deleted

## 2020-04-30 MED ORDER — HYDROCODONE-ACETAMINOPHEN 5-325 MG PO TABS
1.0000 | ORAL_TABLET | Freq: Three times a day (TID) | ORAL | 0 refills | Status: DC | PRN
Start: 2020-04-30 — End: 2020-05-29

## 2020-04-30 NOTE — Telephone Encounter (Signed)
Kim Anthony called to request a refill on her hydrocodone.  Per PMP last fill date was 03/30/2020 Hydrocodone-Acetaminophen 5-325 Mg# 90.00.

## 2020-04-30 NOTE — Telephone Encounter (Signed)
Notified. 

## 2020-04-30 NOTE — Telephone Encounter (Signed)
Refilled pt's Norco 5/325 mg q8 hours prn #90- last refill 10/9- is due

## 2020-05-03 ENCOUNTER — Ambulatory Visit (INDEPENDENT_AMBULATORY_CARE_PROVIDER_SITE_OTHER): Payer: Medicare Other | Admitting: Internal Medicine

## 2020-05-03 VITALS — BP 152/73 | HR 81 | Temp 98.7°F | Ht 65.0 in | Wt 185.6 lb

## 2020-05-03 DIAGNOSIS — M13131 Monoarthritis, not elsewhere classified, right wrist: Secondary | ICD-10-CM

## 2020-05-03 DIAGNOSIS — M67431 Ganglion, right wrist: Secondary | ICD-10-CM

## 2020-05-03 DIAGNOSIS — M13139 Monoarthritis, not elsewhere classified, unspecified wrist: Secondary | ICD-10-CM

## 2020-05-03 DIAGNOSIS — I10 Essential (primary) hypertension: Secondary | ICD-10-CM | POA: Diagnosis not present

## 2020-05-03 DIAGNOSIS — M6740 Ganglion, unspecified site: Secondary | ICD-10-CM

## 2020-05-03 MED ORDER — KETOROLAC TROMETHAMINE 30 MG/ML IJ SOLN
30.0000 mg | Freq: Once | INTRAMUSCULAR | Status: AC
  Administered 2020-05-03: 30 mg via INTRAMUSCULAR

## 2020-05-03 MED ORDER — NAPROXEN 500 MG PO TABS: 500.0000 mg | ORAL_TABLET | Freq: Two times a day (BID) | ORAL | 0 refills | Status: AC

## 2020-05-03 NOTE — Patient Instructions (Signed)
Kim Anthony,  It was a pleasure meeting you today. I am so sorry that you are having so much wrist pain. We unsuccessfully tried draining the fluid today. We think that this may be a ganglion cyst vs gout. I have sent in a prescription for naproxen 500 mg twice day. You can reduce this dose as symptoms improve; discontinue 2 to 3 days after resolution of swelling and pain. I want you to follow up with [place] in 2-3 weeks via a tele-health visit.   Thanks for allowing [place] to be a part of your care!

## 2020-05-03 NOTE — Progress Notes (Signed)
Internal Medicine Clinic Attending  I saw and evaluated the patient.  I personally confirmed the key portions of the history and exam documented by Dr.  Dereck Leep  and I reviewed pertinent patient test results.  The assessment, diagnosis, and plan were formulated together and I agree with the documentation in the resident's note.   Patient here with large ganglion cyst on the dorsal aspect of her wrist that is very painful and limiting her range of motion. We attempted aspiration, unfortunately we were unsuccessful. Agree with conservative management for now, NSAIDs. May need ortho referral if persistent.

## 2020-05-03 NOTE — Assessment & Plan Note (Signed)
Please refer to A/P under ganglion cyst.

## 2020-05-03 NOTE — Assessment & Plan Note (Deleted)
Patient returns to clinic today with acute pain in her right wrist, similar to her previous presentation. She states that the pain started approximately 3 days ago when she suddenly noticed pain and swelling in her wrist and hand. Pain radiates to her shoulder. She was last seen in clinic on 03/19/2020 and at that time referred to an orthopedist for possible fluid drainage and analysis. She states that she followed up with the orthopedist who prescribed her prednisone and recommended she continue her pain medications. She reports she did not have any fluid drained at that time. She states that her pain improved with the prednisone and she was doing well till about 3 days ago. She does report during her last visit the Toradol injection significantly improved her pain as well. She denies any other joint pain or swelling. Denies fever chills nausea or vomiting. No recent sick contacts or recent illnesses. She does report a family history of gout. Her son has gout.  On exam, the patient's right wrist and hand is swollen. No erythema nor warmth to touch. Patient is unable to extend or flex her wrist secondary to pain. She is able to flex her fingers. Along the dorsum of her right wrist there is a pocket of what was presumed fluid along with two smaller pockets along the dorsal surface of her metacarpal bones. Swelling was examined on ultrasound today in clinic which showed two complex cysts as well as what appeared to be a double contour sign consistent with gout arthropathy. Attempted to drain fluid seen within the cyst unsuccessfully, see procedural note for details.  At this time there is high suspicion that this may be a ganglion cyst versus gout. Discussed with patient that she may need to have this evaluated by hand surgeon and possible removal of the ganglion cyst. She was given a shot of Toradol in the clinic and prescribed a short course of naproxen 500 mg twice daily. Will plan to follow-up in 2 weeks via  televisit.   Plan: -Naproxen 500 mg twice daily; reduce dose as symptoms improve; discontinue 2 to 3 days after resolution of clinical signs. Usual duration: 5 to 7 days -Follow-up telehealth visit in 2 weeks to reassess symptoms    PROCEDURE NOTE  PROCEDURE: right wrist cyst drainage.  PREOPERATIVE DIAGNOSIS: Right wrist ganglion cyst versus gout  POSTOPERATIVE DIAGNOSIS: Right wrist ganglion cyst versus gout  PROCEDURE: The patient was apprised of the risks and the benefits of the procedure and informed consent was obtained, as witnessed by Dr. Philipp Ovens. Time-out procedure was performed, with confirmation of the patient's name, date of birth, and correct identification of the right wrist to be drained. The wrist was sterilely prepped with Betadine. The patient was injected with a 22 gauge needle at the lateral  aspect of her  right wrist. This was re-attempted with a 20 gauge needle along the dorsal aspect of her right wrist. No fluid was successfully drained. There were no complications. The patient tolerated the procedure well. There was minimal bleeding.   The procedure was supervised by attending physician, Dr. Philipp Ovens.

## 2020-05-03 NOTE — Assessment & Plan Note (Addendum)
Patient returns to clinic today with acute pain in her right wrist, similar to her previous presentation. She states that the pain started approximately 3 days ago when she suddenly noticed pain and swelling in her wrist and hand. Pain radiates to her shoulder. She was last seen in clinic on 03/19/2020 and at that time referred to an orthopedist for possible fluid drainage and analysis. She states that she followed up with the orthopedist who prescribed her prednisone and recommended she continue her pain medications. She reports she did not have any fluid drained at that time. She states that her pain improved with the prednisone and she was doing well till about 3 days ago. She does report during her last visit the Toradol injection significantly improved her pain as well. She denies any other joint pain or swelling. Denies fever chills nausea or vomiting. No recent sick contacts or recent illnesses. She does report a family history of gout. Her son has gout.  On exam, the patient's right wrist and hand is swollen. No erythema nor warmth to touch. Patient is unable to extend or flex her wrist secondary to pain. She is able to flex her fingers. Along the dorsum of her right wrist there is a well-circumscribed fluctuant fluid-filled area, most consistent with a ganglion cyst.  Swelling was examined on ultrasound today in clinic which showed two complex cysts as well as a questionable double contour sign, which can be seen with gouty arthropathy. Attempted to drain fluid seen within the cyst unsuccessfully, see procedural note for details.  At this time there is high suspicion that this may be a ganglion cyst versus gout (less likely). Discussed with patient that she may need to have this evaluated by hand surgeon and possible removal of the ganglion cyst. She was given a shot of Toradol in the clinic and prescribed a short course of naproxen 500 mg twice daily. Will plan to follow-up in 2 weeks via  televisit.   Plan: -Naproxen 500 mg twice daily; reduce dose as symptoms improve; discontinue 2 to 3 days after resolution of clinical signs. Usual duration: 5 to 7 days -Follow-up telehealth visit in 2 weeks to reassess symptoms    PROCEDURE NOTE  PROCEDURE: right wrist cyst drainage.  PREOPERATIVE DIAGNOSIS: Right wrist ganglion cyst   POSTOPERATIVE DIAGNOSIS: Right wrist ganglion cyst   PROCEDURE: The patient was apprised of the risks and the benefits of the procedure and informed consent was obtained, as witnessed by Dr. Philipp Ovens. Time-out procedure was performed, with confirmation of the patient's name, date of birth, and correct identification of the right wrist to be drained. The wrist was sterilely prepped with Betadine. The patient was injected with a 25 gauge needle at the lateral  aspect of her  right wrist. This was re-attempted with a 22 gauge needle along the dorsal aspect of her right wrist. No fluid was successfully drained. There were no complications. The patient tolerated the procedure well. There was minimal bleeding.   The procedure was supervised by attending physician, Dr. Philipp Ovens.

## 2020-05-03 NOTE — Assessment & Plan Note (Signed)
Vitals:   05/03/20 0955  BP: (!) 152/73  Pulse: 81  Temp: 98.7 F (37.1 C)  SpO2: 95%   Patient's blood pressure elevated on exam today likely secondary to pain. Patient reports compliance with her blood pressure medications, lisinopril 20 mg, furosemide 40 mg and metoprolol 25 mg. Continue current regimen.

## 2020-05-03 NOTE — Assessment & Plan Note (Signed)
>>  ASSESSMENT AND PLAN FOR GANGLION CYST WRITTEN ON 05/03/2020  3:07 PM BY REHMAN, AREEG N, DO  Patient returns to clinic today with acute pain in her right wrist, similar to her previous presentation. She states that the pain started approximately 3 days ago when she suddenly noticed pain and swelling in her wrist and hand. Pain radiates to her shoulder. She was last seen in clinic on 03/19/2020 and at that time referred to an orthopedist for possible fluid drainage and analysis. She states that she followed up with the orthopedist who prescribed her prednisone  and recommended she continue her pain medications. She reports she did not have any fluid drained at that time. She states that her pain improved with the prednisone  and she was doing well till about 3 days ago. She does report during her last visit the Toradol  injection significantly improved her pain as well. She denies any other joint pain or swelling. Denies fever chills nausea or vomiting. No recent sick contacts or recent illnesses. She does report a family history of gout. Her son has gout.  On exam, the patient's right wrist and hand is swollen. No erythema nor warmth to touch. Patient is unable to extend or flex her wrist secondary to pain. She is able to flex her fingers. Along the dorsum of her right wrist there is a well-circumscribed fluctuant fluid-filled area, most consistent with a ganglion cyst.  Swelling was examined on ultrasound today in clinic which showed two complex cysts as well as a questionable double contour sign, which can be seen with gouty arthropathy. Attempted to drain fluid seen within the cyst unsuccessfully, see procedural note for details.  At this time there is high suspicion that this may be a ganglion cyst versus gout (less likely). Discussed with patient that she may need to have this evaluated by hand surgeon and possible removal of the ganglion cyst. She was given a shot of Toradol  in the clinic and prescribed a  short course of naproxen  500 mg twice daily. Will plan to follow-up in 2 weeks via televisit.   Plan: -Naproxen  500 mg twice daily; reduce dose as symptoms improve; discontinue 2 to 3 days after resolution of clinical signs. Usual duration: 5 to 7 days -Follow-up telehealth visit in 2 weeks to reassess symptoms    PROCEDURE NOTE  PROCEDURE: right wrist cyst drainage.  PREOPERATIVE DIAGNOSIS: Right wrist ganglion cyst   POSTOPERATIVE DIAGNOSIS: Right wrist ganglion cyst   PROCEDURE: The patient was apprised of the risks and the benefits of the procedure and informed consent was obtained, as witnessed by Dr. Crystal Dory. Time-out procedure was performed, with confirmation of the patient's name, date of birth, and correct identification of the right wrist to be drained. The wrist was sterilely prepped with Betadine . The patient was injected with a 25 gauge needle at the lateral  aspect of her  right wrist. This was re-attempted with a 22 gauge needle along the dorsal aspect of her right wrist. No fluid was successfully drained. There were no complications. The patient tolerated the procedure well. There was minimal bleeding.   The procedure was supervised by attending physician, Dr. Crystal Dory.

## 2020-05-03 NOTE — Progress Notes (Addendum)
   CC: Right wrist swelling and pain  HPI:  [name] is a 69 y.o. with a PMHx listed below presenting for evaluation of right wrist pain and swelling. For details of today's visit and the status of her chronic medical issues please refer to the assessment and plan.   Past Medical History:  Diagnosis Date  . Arthritis   . Asthma   . Breast cancer ([place]) 2001   Left Breast Cancer  . CHF (congestive heart failure) ([place])   . COPD (chronic obstructive pulmonary disease) ([place])   . Coronary artery disease   . History of mitral valve replacement with mechanical valve   . HLD (hyperlipidemia)   . Hypertension   . Pulmonary embolism ([place])    Review of Systems:   Review of Systems  Constitutional: Negative for chills, fever and malaise/fatigue.  Gastrointestinal: Negative for abdominal pain, nausea and vomiting.  Musculoskeletal: Positive for back pain, joint pain and myalgias. Negative for falls.  Neurological: Positive for weakness. Negative for tingling and sensory change.  Endo/Heme/Allergies: Bruises/bleeds easily.     Physical Exam:  Vitals:   05/03/20 0955  BP: (!) 152/73  Pulse: 81  Temp: 98.7 F (37.1 C)  TempSrc: Oral  SpO2: 95%  Weight: 185 lb 9.6 oz (84.2 kg)  Height: 5\' 5"  (1.651 m)   Physical Exam Vitals reviewed.  Constitutional:      General: She is in acute distress.     Appearance: Normal appearance. She is normal weight. She is not ill-appearing.  Cardiovascular:     Rate and Rhythm: Normal rate and regular rhythm.     Pulses: Normal pulses.     Heart sounds: Normal heart sounds. No murmur heard.  No friction rub. No gallop.   Pulmonary:     Effort: Pulmonary effort is normal. No respiratory distress.     Breath sounds: Normal breath sounds. No wheezing or rales.  Abdominal:     General: Abdomen is flat. Bowel sounds are normal. There is no distension.     Palpations: Abdomen is soft.     Tenderness: There is no abdominal tenderness.    Musculoskeletal:        General: Swelling, tenderness and deformity present.     Right wrist: Swelling, effusion and tenderness present. Decreased range of motion.       Arms:     Comments: Well-circumscribed fluid-filled/fluctuant region along the dorsal aspect of the right wrist.  Skin:    General: Skin is warm and dry.  Neurological:     General: No focal deficit present.     Mental Status: She is alert and oriented to person, place, and time. Mental status is at baseline.  Psychiatric:        Mood and Affect: Mood normal.        Behavior: Behavior normal.        Thought Content: Thought content normal.        Judgment: Judgment normal.     Assessment & Plan:   See Encounters Tab for problem based charting.  Patient seen with Dr. Philipp Ovens

## 2020-05-06 ENCOUNTER — Telehealth: Payer: Self-pay

## 2020-05-06 NOTE — Telephone Encounter (Signed)
Yes, agree. Prescribed only a 5-7 day course. Thanks!

## 2020-05-06 NOTE — Telephone Encounter (Signed)
Pls contact pharmacy [phone]

## 2020-05-06 NOTE — Telephone Encounter (Signed)
Pharmacy calls to say they must check w/ MD because pt is on warfarin and now has been prescribed naproxen. Informed pharm that this will be very short term and MD is aware. VO to fill naproxen, do you agree?

## 2020-05-09 ENCOUNTER — Other Ambulatory Visit: Payer: Self-pay | Admitting: Internal Medicine

## 2020-05-09 NOTE — Telephone Encounter (Signed)
Next appt scheduled 11/29 with Dr Laural Golden.

## 2020-05-20 ENCOUNTER — Other Ambulatory Visit: Payer: Self-pay

## 2020-05-20 ENCOUNTER — Ambulatory Visit (INDEPENDENT_AMBULATORY_CARE_PROVIDER_SITE_OTHER): Payer: Medicare Other | Admitting: Internal Medicine

## 2020-05-20 DIAGNOSIS — M6740 Ganglion, unspecified site: Secondary | ICD-10-CM

## 2020-05-20 NOTE — Assessment & Plan Note (Signed)
>>  ASSESSMENT AND PLAN FOR GANGLION CYST WRITTEN ON 05/20/2020  1:07 PM BY REHMAN, AREEG N, DO  Patient reports continued pain and swelling in her right wrist, although improved since her last visit. She states the pain is a 6/10. She is able to perform most of her ADL's, but still having difficulty lifting heavy objects. She denies any erythema, rash or other joint pain. She states the naproxen  helped alleviate the pain to a much more tolerable level. She took it for about 7 days. She is not currently taking anything for her wrist but is on chronic opioid therapy and states this helps sometimes. Discussed at this point since she is still having so much pain she may benefit from a surgical evaluation and possible resection of the cyst.  Plan:  - Referral to hand surgery for evaluation of ganglion cyst

## 2020-05-20 NOTE — Progress Notes (Signed)
  [facility] Internal Medicine Residency Telephone Encounter Continuity Care Appointment  HPI:   This telephone encounter was created for [name] on 05/20/2020 for the following purpose/cc right wrist ganglion cyst.   Past Medical History:  Past Medical History:  Diagnosis Date  . Arthritis   . Asthma   . Breast cancer ([place]) 2001   Left Breast Cancer  . CHF (congestive heart failure) ([place])   . COPD (chronic obstructive pulmonary disease) ([place])   . Coronary artery disease   . History of mitral valve replacement with mechanical valve   . HLD (hyperlipidemia)   . Hypertension   . Pulmonary embolism ([facility])       ROS:  Review of Systems  Constitutional: Negative for chills and fever.  Musculoskeletal: Positive for joint pain.       Right wrist swelling, no erythema  Skin: Negative for itching and rash.  Neurological: Negative for tingling, sensory change and weakness.     Assessment / Plan / Recommendations:   Please see A&P under problem oriented charting for assessment of the patient's acute and chronic medical conditions.   As always, pt is advised that if symptoms worsen or new symptoms arise, they should go to an urgent care facility or to to ER for further evaluation.   Consent and Medical Decision Making:   Patient discussed with Dr. Dareen Piano  This is a telephone encounter between Kim Anthony and Kim Anthony on 05/20/2020 for follow up regarding right wrist ganglion cyst. The visit was conducted with the patient located at home and [name] at [facility]. The patient's identity was confirmed using their DOB and current address. The patient has consented to being evaluated through a telephone encounter and understands the associated risks (an examination cannot be done and the patient may need to come in for an appointment) / benefits (allows the patient to remain at home, decreasing exposure to coronavirus). I personally spent 10 minutes on medical discussion.

## 2020-05-20 NOTE — Assessment & Plan Note (Signed)
Patient reports continued pain and swelling in her right wrist, although improved since her last visit. She states the pain is a 6/10. She is able to perform most of her ADL's, but still having difficulty lifting heavy objects. She denies any erythema, rash or other joint pain. She states the naproxen helped alleviate the pain to a much more tolerable level. She took it for about 7 days. She is not currently taking anything for her wrist but is on chronic opioid therapy and states this helps sometimes. Discussed at this point since she is still having so much pain she may benefit from a surgical evaluation and possible resection of the cyst.  Plan:  - Referral to hand surgery for evaluation of ganglion cyst

## 2020-05-21 ENCOUNTER — Other Ambulatory Visit: Payer: Self-pay | Admitting: Physical Medicine and Rehabilitation

## 2020-05-21 ENCOUNTER — Ambulatory Visit: Payer: Medicare Other | Admitting: *Deleted

## 2020-05-21 DIAGNOSIS — G8929 Other chronic pain: Secondary | ICD-10-CM

## 2020-05-21 DIAGNOSIS — I10 Essential (primary) hypertension: Secondary | ICD-10-CM

## 2020-05-21 DIAGNOSIS — J449 Chronic obstructive pulmonary disease, unspecified: Secondary | ICD-10-CM

## 2020-05-21 DIAGNOSIS — I5042 Chronic combined systolic (congestive) and diastolic (congestive) heart failure: Secondary | ICD-10-CM

## 2020-05-21 DIAGNOSIS — M6740 Ganglion, unspecified site: Secondary | ICD-10-CM

## 2020-05-21 DIAGNOSIS — M5441 Lumbago with sciatica, right side: Secondary | ICD-10-CM

## 2020-05-21 DIAGNOSIS — E785 Hyperlipidemia, unspecified: Secondary | ICD-10-CM

## 2020-05-21 NOTE — Progress Notes (Signed)
Internal Medicine Clinic Resident  I have personally reviewed this encounter including the documentation in this note and/or discussed this patient with the care management provider. I will address any urgent items identified by the care management provider and will communicate my actions to the patient's PCP. I have reviewed the patient's CCM visit with my supervising attending, Dr Narendra.  Zaya Kessenich M Marlyss Cissell, MD 05/21/2020    

## 2020-05-21 NOTE — Chronic Care Management (AMB) (Signed)
Chronic Care Management   Follow Up Note   05/21/2020 Name: Kim Anthony [name]: [id number] DOB: January 29, 1951  Referred by: Marianna Payment, MD Reason for referral : Chronic Care Management (COPD, Asthma, HF, HTN, HLD, on Coumadin r/t MVR, chronic pain syndrome on opioids)   Kim Anthony is a 69 y.o. year old female who is a primary care patient of Marianna Payment, MD. The CCM team was consulted for assistance with chronic disease management and care coordination needs.    Review of patient status, including review of consultants reports, relevant laboratory and other test results, and collaboration with appropriate care team members and the patient's provider was performed as part of comprehensive patient evaluation and provision of chronic care management services.    SDOH (Social Determinants of Health) assessments performed: No See Care Plan activities for detailed interventions related to [facility])     Outpatient Encounter Medications as of 05/21/2020  Medication Sig Note  . albuterol (PROVENTIL) (2.5 MG/3ML) 0.083% nebulizer solution Take 3 mLs (2.5 mg total) by nebulization every 6 (six) hours as needed for wheezing or shortness of breath. 11/28/2019: Uses 3 times week during high pollen  . albuterol (VENTOLIN HFA) 108 (90 Base) MCG/ACT inhaler Inhale 1-2 puffs into the lungs every 6 (six) hours as needed for wheezing or shortness of breath. 11/28/2019: Uses twice daily  . Calcium-Phosphorus-Vitamin D (CITRACAL +D3 PO) Take 1 tablet by mouth 2 (two) times a day.   . fluticasone (FLONASE) 50 MCG/ACT nasal spray Place 2 sprays into both nostrils daily. (Patient taking differently: Place 1 spray into both nostrils daily as needed for allergies. ) 04/18/2020: Requesting refill  . furosemide (LASIX) 40 MG tablet Take 1 tablet by mouth twice daily   . gabapentin (NEURONTIN) 600 MG tablet Take 1 tablet (600 mg total) by mouth 4 (four) times daily. X 4 days, then go to 1200 mg 3x/day. For nerve pain 03/21/2020:  Takes 1200 mg three times daily  . HYDROcodone-acetaminophen (NORCO) 5-325 MG tablet Take 1 tablet by mouth every 8 (eight) hours as needed for moderate pain.   [name] lisinopril (ZESTRIL) 20 MG tablet Take 1 tablet by mouth once daily   . metoprolol succinate (TOPROL-XL) 25 MG 24 hr tablet Take 1 tablet (25 mg total) by mouth daily.   . potassium chloride SA (KLOR-CON) 20 MEQ tablet Take 2 tablets (40 mEq total) by mouth daily.   . pregabalin (LYRICA) 100 MG capsule Take 1 capsule (100 mg total) by mouth 3 (three) times daily.   . simvastatin (ZOCOR) 20 MG tablet Take 1 tablet by mouth once daily   . Spacer/Aero-Holding Chambers (BREATHERITE COLL SPACER ADULT) MISC 1 applicator by Does not apply route daily.   [name] STIOLTO RESPIMAT 2.5-2.5 MCG/ACT AERS INHALE 2 PUFFS BY MOUTH ONCE DAILY   . warfarin (COUMADIN) 5 MG tablet TAKE 1 TABLET BY MOUTH ONCE DAILY (AT  4PM)    No facility-administered encounter medications on file as of 05/21/2020.     Objective:  Lab Results  Component Value Date   INR 1.4 (A) 02/28/2020   INR 3.6 (H) 02/15/2020   INR 1.7 (A) 01/22/2020  Have requested daughter check patient's INR tonight and test results to [name]  Goals Addressed              This Visit's Progress     Patient Stated   .  I'm taking all my medications like I'm supposed to." (pt-stated)        [place] (  see longitudinal plan of care for additional care plan information)  Current Barriers:  . Chronic Disease Management support, education, and care coordination needs related to CHF, CAD, HTN, HLD, COPD, and Asthma - reached patient via phone successfully and follow up assessment completed, her current complaint is right wrist pain and she completed a telehealth visit yesterday and will be referred to a hand surgeon for consideration of ganglion cyst removal, she said the pain was "really bad" last night despite wearing the wrist brace and taking her chronic opioid pain medication, she  says she hopes she can get in to see the hand surgeon soon, she says she get short of breath anytime she goes out when it's windy and especially after she smokes outside when it's windy, she says the use of her nebulizer relives her symptoms, she says she is not ready to stop smoking although she knows she should, denies nocturnal dyspnea, she reports good medication taking behavior. She agrees the last time her INR was checked was in September , after repeated discussion of the importance of regular INR checks and that her INR was not therapeutic in September,  patient agrees to ask her daughter to check her INR tonight when she gets home from work and she will text the results to [name]- director of the [facility],  says she continues to receive visits from [place] as a participant in their [place] with the last visit occurring on 04/30/20  Clinical Goal(s) related to CHF, CAD, HTN, HLD, COPD, and asthma :  Over the next 30 - 60 days, patient will:  . Work with the care management team to address educational, disease management, and care coordination needs  . Begin or continue self health monitoring activities as directed today Measure and record blood pressure 1-2 times per week and as needed  when you are able to get a home BP monitor, monitor O2 saturations when work of breathing changes from baseline . Call provider office for new or worsened signs and symptoms of CHF, HTN, HLD, COPD, and Asthma . Call care management team with questions or concerns . Verbalize basic understanding of patient centered plan of care established today  Interventions related to CHF, CAD, HTN, HLD, COPD, and Asthma :  . Evaluation of current treatment plans and patient's adherence to plan as established by provider . Assessed patient's education and care coordination needs . Reinforced the importance of INR monitoring while on Coumadin, reviewed results of last INR done 9/8 with  subtherapeutic levels and requested she have daughter check INR tonight and text results to Dr. Elie Confer since Coumadin dose may need adjustment . Verified with patient that she continues to receive home based primary care via [facility] - per chart review most recent home visit was 04/30/20 . Collaborated with appropriate clinical care team members regarding patient needs- will notify [name] that patient has been asked to have daughter check INR tonight and text him the results  . Encouraged patient to call the clinic at the end of the week to check on status of referral to hand surgeon    Patient Self Care Activities related to CHF, HTN, HLD, COPD, and Asthma :  . Patient is unable to independently self-manage chronic health conditions  Please see past updates related to this goal by clicking on the "Past Updates" button in the selected goal          Plan:   The care management team will  reach out to the patient again over the next 30-60 days.    Kelli Churn RN, CCM, [facility] RN Care Manager [phone]

## 2020-05-21 NOTE — Patient Instructions (Signed)
Visit Information It was nice speaking with you today. Goals Addressed              This Visit's Progress     Patient Stated   .  I'm taking all my medications like I'm supposed to." (pt-stated)        CARE PLAN ENTRY (see longitudinal plan of care for additional care plan information)  Current Barriers:  . Chronic Disease Management support, education, and care coordination needs related to CHF, CAD, HTN, HLD, COPD, and Asthma - reached patient via phone successfully and follow up assessment completed, her current complaint is right wrist pain and she completed a telehealth visit yesterday and will be referred to a hand surgeon for consideration of ganglion cyst removal, she said the pain was "really bad" last night despite wearing the wrist brace and taking her chronic opioid pain medication, she says she hopes she can get in to see the hand surgeon soon, she says she get short of breath anytime she goes out when it's windy and especially after she smokes outside when it's windy, she says the use of her nebulizer relives her symptoms, she says she is not ready to stop smoking although she knows she should, denies nocturnal dyspnea, she reports good medication taking behavior. She agrees the last time her INR was checked was in September , after repeated discussion of the importance of regular INR checks and that her INR was not therapeutic in September,  patient agrees to ask her daughter to check her INR tonight when she gets home from work and she will text the results to [name]- director of the [facility],  says she continues to receive visits from [place] as a participant in their [place] with the last visit occurring on 04/30/20  Clinical Goal(s) related to CHF, CAD, HTN, HLD, COPD, and asthma :  Over the next 30 - 60 days, patient will:  . Work with the care management team to address educational, disease management, and care coordination needs   . Begin or continue self health monitoring activities as directed today Measure and record blood pressure 1-2 times per week and as needed  when you are able to get a home BP monitor, monitor O2 saturations when work of breathing changes from baseline . Call provider office for new or worsened signs and symptoms of CHF, HTN, HLD, COPD, and Asthma . Call care management team with questions or concerns . Verbalize basic understanding of patient centered plan of care established today  Interventions related to CHF, CAD, HTN, HLD, COPD, and Asthma :  . Evaluation of current treatment plans and patient's adherence to plan as established by provider . Assessed patient's education and care coordination needs . Reinforced the importance of INR monitoring while on Coumadin, reviewed results of last INR done 9/8 with subtherapeutic levels and requested she have daughter check INR tonight and text results to Dr. Elie Confer since Coumadin dose may need adjustment . Verified with patient that she continues to receive home based primary care via [facility] - per chart review most recent home visit was 04/30/20 . Collaborated with appropriate clinical care team members regarding patient needs- will notify [name] that patient has been asked to have daughter check INR tonight and text him the results  . Encouraged patient to call the clinic at the end of the week to check on status of referral to hand surgeon    Patient Self Care Activities related  to CHF, HTN, HLD, COPD, and Asthma :  . Patient is unable to independently self-manage chronic health conditions  Please see past updates related to this goal by clicking on the "Past Updates" button in the selected goal         The patient verbalized understanding of instructions, educational materials, and care plan provided today and declined offer to receive copy of patient instructions, educational materials, and care plan.   The care management team will  reach out to the patient again over the next 30-60 days.   Kim Churn RN, CCM, [facility] RN Care Manager [phone]

## 2020-05-21 NOTE — Progress Notes (Signed)
Internal Medicine Clinic Attending  CCM services provided by the care management provider and their documentation were discussed with Dr. Krienke. We reviewed the pertinent findings, urgent action items addressed by the resident and non-urgent items to be addressed by the PCP.  I agree with the assessment, diagnosis, and plan of care documented in the CCM and resident's note.  Jennavecia Schwier, MD 05/21/2020  

## 2020-05-22 NOTE — Progress Notes (Signed)
Internal Medicine Clinic Attending ° °Case discussed with Dr. Rehman  At the time of the visit.  We reviewed the resident’s history and exam and pertinent patient test results.  I agree with the assessment, diagnosis, and plan of care documented in the resident’s note.  ° °

## 2020-05-22 NOTE — Addendum Note (Signed)
Addended by: Aldine Contes on: 05/22/2020 09:48 AM   Modules accepted: Level of Service

## 2020-05-27 NOTE — Addendum Note (Signed)
Addended by: Hulan Fray on: 05/27/2020 05:14 PM   Modules accepted: Orders

## 2020-05-29 ENCOUNTER — Other Ambulatory Visit: Payer: Self-pay | Admitting: Internal Medicine

## 2020-05-29 ENCOUNTER — Telehealth: Payer: Self-pay | Admitting: *Deleted

## 2020-05-29 ENCOUNTER — Ambulatory Visit: Payer: Medicare Other | Admitting: Physical Medicine and Rehabilitation

## 2020-05-29 DIAGNOSIS — R2231 Localized swelling, mass and lump, right upper limb: Secondary | ICD-10-CM | POA: Diagnosis not present

## 2020-05-29 DIAGNOSIS — M25531 Pain in right wrist: Secondary | ICD-10-CM | POA: Insufficient documentation

## 2020-05-29 DIAGNOSIS — M67431 Ganglion, right wrist: Secondary | ICD-10-CM | POA: Diagnosis not present

## 2020-05-29 DIAGNOSIS — R0602 Shortness of breath: Secondary | ICD-10-CM

## 2020-05-29 DIAGNOSIS — M25431 Effusion, right wrist: Secondary | ICD-10-CM | POA: Diagnosis not present

## 2020-05-29 MED ORDER — HYDROCODONE-ACETAMINOPHEN 5-325 MG PO TABS: 1.0000 | ORAL_TABLET | Freq: Three times a day (TID) | ORAL | 0 refills | Status: DC | PRN

## 2020-05-29 NOTE — Telephone Encounter (Signed)
Kim Anthony is requesting a refill on her hydrocodone.  PMP last fill date 04/30/2020.

## 2020-05-29 NOTE — Telephone Encounter (Signed)
Refilled Norco 5/325 q8 hours prn- #90- no refills

## 2020-06-03 ENCOUNTER — Encounter
Payer: Medicare Other | Attending: Physical Medicine and Rehabilitation | Admitting: Physical Medicine and Rehabilitation

## 2020-06-03 ENCOUNTER — Encounter: Payer: Self-pay | Admitting: Physical Medicine and Rehabilitation

## 2020-06-03 ENCOUNTER — Other Ambulatory Visit: Payer: Self-pay

## 2020-06-03 VITALS — BP 161/84 | HR 68 | Temp 98.3°F | Ht 65.0 in | Wt 179.8 lb

## 2020-06-03 DIAGNOSIS — G894 Chronic pain syndrome: Secondary | ICD-10-CM | POA: Insufficient documentation

## 2020-06-03 DIAGNOSIS — M25531 Pain in right wrist: Secondary | ICD-10-CM | POA: Diagnosis not present

## 2020-06-03 NOTE — Progress Notes (Signed)
Subjective:    Patient ID: Kim Anthony, female    DOB: February 10, 1951, 69 y.o.   [name]: [id number]  HPI   Patient is a 69 yr old female with aortic stenosis s/p AVR on Coumadin, HTN, hx of PE's on chronic coumadin, CHF, and COPD- here forf/uof R side low back Pain with sciatica. Tramadol makes her ill as well as Tylenol #3 and Darvocet.   Went to hand doctor last week- R wrist hurts so bad.  Can't take pain meds from him, because of pain contract.   Bothering her at most at night- wakes her form sleep.  They want to do U/S of R wrist to find out what's going on - they haven't called her to schedule as of yet.   Just started- no trauma. Came out of nowhere- was given Prednisone- even helped,  But as soon as stopped Prednisone, pain came back.  Pain is sharp- stabbing; no burning, no tingling- also aching.   Also PCP tried to pull fluid out of Wrist- couldn't get any.   Using R wrist splint.   Also having R groin pain.    Pain Inventory Average Pain 7 Pain Right Now 7 My pain is aching  In the last 24 hours, has pain interfered with the following? General activity 7 Relation with others 7 Enjoyment of life 7 What TIME of day is your pain at its worst? daytime Sleep (in general) Poor  Pain is worse with: walking and standing Pain improves with: medication Relief from Meds: 5  Family History  Problem Relation Age of Onset  . Heart attack Other   . Breast cancer Maternal Aunt 46   Social History   Socioeconomic History  . Marital status: Single    Spouse name: Not on file  . Number of children: Not on file  . Years of education: Not on file  . Highest education level: Not on file  Occupational History  . Not on file  Tobacco Use  . Smoking status: Current Every Day Smoker    Packs/day: 0.25    Years: 30.00    Pack years: 7.50    Types: Cigarettes  . Smokeless tobacco: Never Used  . Tobacco comment: Sometimes less.  Vaping Use  . Vaping Use: Never used   Substance and Sexual Activity  . Alcohol use: Yes    Comment: beer- ocasional  . Drug use: No  . Sexual activity: Not Currently    Birth control/protection: None  Other Topics Concern  . Not on file  Social History Narrative   ** Merged History Encounter **       Social Determinants of Health   Financial Resource Strain: Low Risk   . Difficulty of Paying Living Expenses: Not hard at all  Food Insecurity: No Food Insecurity  . Worried About Charity fundraiser in the Last Year: Never true  . Ran Out of Food in the Last Year: Never true  Transportation Needs: No Transportation Needs  . Lack of Transportation (Medical): No  . Lack of Transportation (Non-Medical): No  Physical Activity: Not on file  Stress: Not on file  Social Connections: Socially Isolated  . Frequency of Communication with Friends and Family: More than three times a week  . Frequency of Social Gatherings with Friends and Family: More than three times a week  . Attends Religious Services: Never  . Active Member of Clubs or Organizations: No  . Attends Archivist Meetings: Not on file  .  Marital Status: Never married   Past Surgical History:  Procedure Laterality Date  . APPLICATION OF A-CELL OF CHEST/ABDOMEN Left 01/27/2019   Procedure: APPLICATION OF A-CELL OF CHEST;  Surgeon: Wallace Going, DO;  Location: [place];  Service: Plastics;  Laterality: Left;  . APPLICATION OF WOUND VAC N/A 01/27/2019   Procedure: APPLICATION OF WOUND VAC;  Surgeon: Wallace Going, DO;  Location: [place];  Service: Plastics;  Laterality: N/A;  . COLONOSCOPY WITH PROPOFOL N/A 06/07/2018   Procedure: COLONOSCOPY WITH PROPOFOL;  Surgeon: Wilford Corner, MD;  [facility];  Service: Endoscopy;  Laterality: N/A;  . DEBRIDEMENT AND CLOSURE WOUND Left 12/28/2018   Procedure: Debridement And Closure Wound;  Surgeon: Coralie Keens, MD;  Location: [place];  Service: General;  Laterality: Left;  . INCISION AND  DRAINAGE OF WOUND Left 01/27/2019   Procedure: IRRIGATION AND DEBRIDEMENT OF CHEST WALL;  Surgeon: Wallace Going, DO;  Location: [place];  Service: Plastics;  Laterality: Left;  . LATISSIMUS FLAP TO BREAST Left 01/18/2019   Procedure: LEFT LATISSIMUS FLAP TO BREAST;  Surgeon: Wallace Going, DO;  Location: [place];  Service: Plastics;  Laterality: Left;  [name] MASTECTOMY Left 2000s   for breast cancer, also s/p radiation and chemo  . MITRAL VALVE REPLACEMENT     mechanical  . POLYPECTOMY  06/07/2018   Procedure: POLYPECTOMY;  Surgeon: Wilford Corner, MD;  [facility];  Service: Endoscopy;;  . WOUND DEBRIDEMENT Left 12/28/2018   Procedure: EXCISION OF LEFT CHRONIC CHEST WALL WOUND;  Surgeon: Coralie Keens, MD;  Location: [place];  Service: General;  Laterality: Left;   Past Surgical History:  Procedure Laterality Date  . APPLICATION OF A-CELL OF CHEST/ABDOMEN Left 01/27/2019   Procedure: APPLICATION OF A-CELL OF CHEST;  Surgeon: Wallace Going, DO;  Location: [place];  Service: Plastics;  Laterality: Left;  . APPLICATION OF WOUND VAC N/A 01/27/2019   Procedure: APPLICATION OF WOUND VAC;  Surgeon: Wallace Going, DO;  Location: [place];  Service: Plastics;  Laterality: N/A;  . COLONOSCOPY WITH PROPOFOL N/A 06/07/2018   Procedure: COLONOSCOPY WITH PROPOFOL;  Surgeon: Wilford Corner, MD;  [facility];  Service: Endoscopy;  Laterality: N/A;  . DEBRIDEMENT AND CLOSURE WOUND Left 12/28/2018   Procedure: Debridement And Closure Wound;  Surgeon: Coralie Keens, MD;  Location: [place];  Service: General;  Laterality: Left;  . INCISION AND DRAINAGE OF WOUND Left 01/27/2019   Procedure: IRRIGATION AND DEBRIDEMENT OF CHEST WALL;  Surgeon: Wallace Going, DO;  Location: [place];  Service: Plastics;  Laterality: Left;  . LATISSIMUS FLAP TO BREAST Left 01/18/2019   Procedure: LEFT LATISSIMUS FLAP TO BREAST;  Surgeon: Wallace Going, DO;  Location: [place];  Service:  Plastics;  Laterality: Left;  [name] MASTECTOMY Left 2000s   for breast cancer, also s/p radiation and chemo  . MITRAL VALVE REPLACEMENT     mechanical  . POLYPECTOMY  06/07/2018   Procedure: POLYPECTOMY;  Surgeon: Wilford Corner, MD;  [facility];  Service: Endoscopy;;  . WOUND DEBRIDEMENT Left 12/28/2018   Procedure: EXCISION OF LEFT CHRONIC CHEST WALL WOUND;  Surgeon: Coralie Keens, MD;  Location: [place];  Service: General;  Laterality: Left;   Past Medical History:  Diagnosis Date  . Arthritis   . Asthma   . Breast cancer ([place]) 2001   Left Breast Cancer  . CHF (congestive heart failure) ([place])   . COPD (chronic obstructive pulmonary disease) ([place])   .  Coronary artery disease   . History of mitral valve replacement with mechanical valve   . HLD (hyperlipidemia)   . Hypertension   . Pulmonary embolism (H[facility])    BP (!) 161/84   Pulse 68   Temp 98.3 F (36.8 C)   Ht 5\' 5"  (1.651 m)   Wt 179 lb 12.8 oz (81.6 kg)   SpO2 97%   BMI 29.92 kg/m   Opioid Risk Score:   Fall Risk Score:  `1  Depression screen PHQ 2/9  Depression screen [facility] 2/9 05/03/2020 03/19/2020 02/28/2020 11/01/2019 09/14/2019 08/16/2019 03/09/2019  Decreased Interest 0 0 0 0 0 0 0  Down, Depressed, Hopeless 0 0 0 0 0 0 0  PHQ - 2 Score 0 0 0 0 0 0 0  Altered sleeping - - - 3 1 0 -  Tired, decreased energy - - - 1 1 0 -  Change in appetite - - - 1 0 0 -  Feeling bad or failure about yourself  - - - 0 0 0 -  Trouble concentrating - - - 0 0 0 -  Moving slowly or fidgety/restless - - - 0 0 0 -  Suicidal thoughts - - - 0 0 0 -  PHQ-9 Score - - - 5 2 0 -  Difficult doing work/chores - - - Somewhat difficult Not difficult at all Not difficult at all -  Some recent data might be hidden    Family History  Problem Relation Age of Onset  . Heart attack Other   . Breast cancer Maternal Aunt 59   Social History   Socioeconomic History  . Marital status: Single    Spouse name: Not on file  . Number of  children: Not on file  . Years of education: Not on file  . Highest education level: Not on file  Occupational History  . Not on file  Tobacco Use  . Smoking status: Current Every Day Smoker    Packs/day: 0.25    Years: 30.00    Pack years: 7.50    Types: Cigarettes  . Smokeless tobacco: Never Used  . Tobacco comment: Sometimes less.  Vaping Use  . Vaping Use: Never used  Substance and Sexual Activity  . Alcohol use: Yes    Comment: beer- ocasional  . Drug use: No  . Sexual activity: Not Currently    Birth control/protection: None  Other Topics Concern  . Not on file  Social History Narrative   ** Merged History Encounter **       Social Determinants of Health   Financial Resource Strain: Low Risk   . Difficulty of Paying Living Expenses: Not hard at all  Food Insecurity: No Food Insecurity  . Worried About Charity fundraiser in the Last Year: Never true  . Ran Out of Food in the Last Year: Never true  Transportation Needs: No Transportation Needs  . Lack of Transportation (Medical): No  . Lack of Transportation (Non-Medical): No  Physical Activity: Not on file  Stress: Not on file  Social Connections: Socially Isolated  . Frequency of Communication with Friends and Family: More than three times a week  . Frequency of Social Gatherings with Friends and Family: More than three times a week  . Attends Religious Services: Never  . Active Member of Clubs or Organizations: No  . Attends Archivist Meetings: Not on file  . Marital Status: Never married   Past Surgical History:  Procedure Laterality Date  .  APPLICATION OF A-CELL OF CHEST/ABDOMEN Left 01/27/2019   Procedure: APPLICATION OF A-CELL OF CHEST;  Surgeon: Wallace Going, DO;  Location: [place];  Service: Plastics;  Laterality: Left;  . APPLICATION OF WOUND VAC N/A 01/27/2019   Procedure: APPLICATION OF WOUND VAC;  Surgeon: Wallace Going, DO;  Location: [place];  Service: Plastics;  Laterality:  N/A;  . COLONOSCOPY WITH PROPOFOL N/A 06/07/2018   Procedure: COLONOSCOPY WITH PROPOFOL;  Surgeon: Wilford Corner, MD;  [facility];  Service: Endoscopy;  Laterality: N/A;  . DEBRIDEMENT AND CLOSURE WOUND Left 12/28/2018   Procedure: Debridement And Closure Wound;  Surgeon: Coralie Keens, MD;  Location: [place];  Service: General;  Laterality: Left;  . INCISION AND DRAINAGE OF WOUND Left 01/27/2019   Procedure: IRRIGATION AND DEBRIDEMENT OF CHEST WALL;  Surgeon: Wallace Going, DO;  Location: [place];  Service: Plastics;  Laterality: Left;  . LATISSIMUS FLAP TO BREAST Left 01/18/2019   Procedure: LEFT LATISSIMUS FLAP TO BREAST;  Surgeon: Wallace Going, DO;  Location: [place];  Service: Plastics;  Laterality: Left;  [name] MASTECTOMY Left 2000s   for breast cancer, also s/p radiation and chemo  . MITRAL VALVE REPLACEMENT     mechanical  . POLYPECTOMY  06/07/2018   Procedure: POLYPECTOMY;  Surgeon: Wilford Corner, MD;  [facility];  Service: Endoscopy;;  . WOUND DEBRIDEMENT Left 12/28/2018   Procedure: EXCISION OF LEFT CHRONIC CHEST WALL WOUND;  Surgeon: Coralie Keens, MD;  Location: [place];  Service: General;  Laterality: Left;   Past Medical History:  Diagnosis Date  . Arthritis   . Asthma   . Breast cancer ([place]) 2001   Left Breast Cancer  . CHF (congestive heart failure) ([place])   . COPD (chronic obstructive pulmonary disease) ([place])   . Coronary artery disease   . History of mitral valve replacement with mechanical valve   . HLD (hyperlipidemia)   . Hypertension   . Pulmonary embolism (H[facility])    BP (!) 161/84   Pulse 68   Temp 98.3 F (36.8 C)   Ht 5\' 5"  (1.651 m)   Wt 179 lb 12.8 oz (81.6 kg)   SpO2 97%   BMI 29.92 kg/m   Opioid Risk Score:   Fall Risk Score:  `1  Depression screen PHQ 2/9  Depression screen [facility] 2/9 05/03/2020 03/19/2020 02/28/2020 11/01/2019 09/14/2019 08/16/2019 03/09/2019  Decreased Interest 0 0 0 0 0 0 0  Down, Depressed,  Hopeless 0 0 0 0 0 0 0  PHQ - 2 Score 0 0 0 0 0 0 0  Altered sleeping - - - 3 1 0 -  Tired, decreased energy - - - 1 1 0 -  Change in appetite - - - 1 0 0 -  Feeling bad or failure about yourself  - - - 0 0 0 -  Trouble concentrating - - - 0 0 0 -  Moving slowly or fidgety/restless - - - 0 0 0 -  Suicidal thoughts - - - 0 0 0 -  PHQ-9 Score - - - 5 2 0 -  Difficult doing work/chores - - - Somewhat difficult Not difficult at all Not difficult at all -  Some recent data might be hidden    Review of Systems  Musculoskeletal: Positive for back pain.       Right wrist pain  All other systems reviewed and are negative.      Objective:   Physical Exam  Awake, alert,  appropriate, no assistive device, NAD TTP over R medial wrist and medial hand- dorsum of hand- from base of 1st MCP to base of 3rd MCP- Slightly swollen at base of 1st MCP-  And increased pain with Wrist flexion and extension, but Finkelsteins is completely (-).         Assessment & Plan:  \\ Patient is a 69 yr old female with aortic stenosis s/p AVR on Coumadin, HTN, hx of PE's on chronic coumadin, CHF, and COPD- here forf/uof R side low back Pain with sciatica. Tramadol makes her ill as well as Tylenol #3 and Darvocet. And new R wrist pain.   1. When hand surgeon does an injection of R hand/wrist- suggest numbing it up first- can use lidocaine injection with small needle OR topical lidocaine to numb it up.   2. Topical Lidocaine- is usually near [place]- at drug store- is 4% is the max dose- can get over the counter at drug store.  Up to 4x/day.   3. SO increase your Hydrocodone up to 5x/day and I expect you to call EARLY- will probably call back for early refill by ~ 12/21 - EXPECT early REFILL- for now for ~ 2 months.   4. F/U in 3 months- will call me and let me know how things are going.   I spent a total of 25 minutes on visit- as detailed above.

## 2020-06-03 NOTE — Patient Instructions (Signed)
Patient is a 69 yr old female with aortic stenosis s/p AVR on Coumadin, HTN, hx of PE's on chronic coumadin, CHF, and COPD- here forf/uof R side low back Pain with sciatica. Tramadol makes her ill as well as Tylenol #3 and Darvocet. And new R wrist pain.   1. When hand surgeon does an injection of R hand/wrist- suggest numbing it up first- can use lidocaine injection with small needle OR topical lidocaine to numb it up.   2. Topical Lidocaine- is usually near [place]- at drug store- is 4% is the max dose- can get over the counter at drug store.  Up to 4x/day.   3. SO increase your Hydrocodone up to 5x/day and I expect you to call EARLY- will probably call back for early refill by ~ 12/21 - EXPECT early REFILL- for now for ~ 2 months.   4. F/U in 3 months- will call me and let me know how things are going.

## 2020-06-07 ENCOUNTER — Telehealth: Payer: Self-pay | Admitting: *Deleted

## 2020-06-07 NOTE — Telephone Encounter (Signed)
Pt calls and states her hands are hurting a lot and wants to know if a HHN could come put one of those shots in it, explained no a doctor must do the injection. Suggested if she was having a great deal of discomfort to go to urg care and f/u in [facility] next week, she is agreeable.

## 2020-06-10 ENCOUNTER — Other Ambulatory Visit: Payer: Self-pay

## 2020-06-10 NOTE — Telephone Encounter (Signed)
Agree with plan 

## 2020-06-10 NOTE — Telephone Encounter (Signed)
PMP states :  Hydrocodone last filled on 05/30/2020 last written on 12/8/2021for 90.   Per patient she was advised to call early for refills. Because she is taking the pain medicine 5 times a day. As advised by Dr. Dagoberto Ligas.   Thank you

## 2020-06-11 ENCOUNTER — Other Ambulatory Visit: Payer: Self-pay

## 2020-06-11 ENCOUNTER — Encounter: Payer: Self-pay | Admitting: Internal Medicine

## 2020-06-11 ENCOUNTER — Ambulatory Visit (INDEPENDENT_AMBULATORY_CARE_PROVIDER_SITE_OTHER): Payer: Medicare Other | Admitting: Internal Medicine

## 2020-06-11 VITALS — BP 134/81 | HR 73 | Temp 98.6°F | Ht 65.0 in | Wt 183.3 lb

## 2020-06-11 DIAGNOSIS — M6740 Ganglion, unspecified site: Secondary | ICD-10-CM

## 2020-06-11 DIAGNOSIS — M130 Polyarthritis, unspecified: Secondary | ICD-10-CM | POA: Diagnosis not present

## 2020-06-11 MED ORDER — PREDNISONE 10 MG PO TABS: ORAL_TABLET | ORAL | 0 refills | Status: AC

## 2020-06-11 MED ORDER — HYDROCODONE-ACETAMINOPHEN 5-325 MG PO TABS: 1.0000 | ORAL_TABLET | ORAL | 0 refills | Status: DC | PRN

## 2020-06-11 NOTE — Assessment & Plan Note (Signed)
>>  ASSESSMENT AND PLAN FOR GANGLION CYST WRITTEN ON 06/25/2020 11:00 AM BY SEAWELL, JAIMIE A, DO  Right wrist pain for the past six months, constant, worse with flexion and extension. She now has increased swelling of the wrist, hand, and pain with flexion at the elbow. She endorses recent rash on her legs about two weeks ago. She denies pain in other joints. She denies fever, chills. She was previously given oral prednisone  and an injection, both of which helped the pain.  She has swelling diffuse swelling of the wrist hand and fingers, with moderate fluctuance along the dorsal aspect of the wrist, mild fluctuance of volar wrist, and small amount of fluctuance in between the distal metacarpal phalangeal joints. She has tenderness to the metacarpals and wrist with palpation and all ROM. No erythema, increased warms or lesions. Her elbow is very slightly swollen.  Bedside US  does show deeper cyst, which is possibly the ganglion cyst. It is not currently palpable, possibly due to pain with increased palpation. Area of bogginess is easily compressed with US .  Doubt DVT as she is on warfarin chronically. She has follow-up with orthopedic surgery once official US  is done but she has not received a call for this yet. Possibly secondary to obstruction from ganglion cyst, although has pain and swelling up to the elbow. She has not yet been worked up for possible inflammatory condition.   - ANA, CRP, ESR, RF, CCP  - soft tissue US  RUE - follow-up with orthopedics after US   - prednisone  40 mg with taper   ADDENDUM: Labs significant for elevated ESR and CRP only. US  still has not been complete. Discussed labs with her and she will call clinic this afternoon to try to schedule US . With diffuse swelling, pain up to the elbow, history of left breast cancer w/mastectomy, and right paratracheal lymphadenopathy in 2018, consider axillary and breast exam at f/u. She also has right screening mammogram ordered but not yet  completed.

## 2020-06-11 NOTE — Progress Notes (Signed)
   CC: wrist pain  HPI:  [name] is a 69 y.o. with PMH as below.   Please see A&P for assessment of the patient's acute and chronic medical conditions.   Right wrist pain for the past six months, constant, worse with flexion and extension. She now has increased swelling of the wrist, hand, and pain with flexion at the elbow. She endorses recent rash on her legs about two weeks ago. She denies pain in other joints. She denies fever, chills. She was previously given oral prednisone and an injection, both of which helped the pain.    Past Medical History:  Diagnosis Date  . Arthritis   . Asthma   . Breast cancer ([place]) 2001   Left Breast Cancer  . CHF (congestive heart failure) ([place])   . COPD (chronic obstructive pulmonary disease) ([place])   . Coronary artery disease   . History of mitral valve replacement with mechanical valve   . HLD (hyperlipidemia)   . Hypertension   . Pulmonary embolism ([facility])    Review of Systems:   10 point ROS negative except as noted in HPI  Physical Exam:   Constitution: NAD, appears stated age 52: RRR, no m/r/g, no LE edema  Respiratory: CTA, no w/r/r MSK:  Right hand: diffuse swelling of the wrist hand and fingers, with moderate fluctuance along the dorsal aspect of the wrist, mild fluctuance of volar wrist, and small amount of fluctuance in between the distal metacarpal phalangeal joints. Tenderness to the metacarpals and wrist with palpation and all ROM, ROM limited by pain. No erythema, increased warmth or lesions. Her elbow is very slightly swollen, pain with flexion  Neuro: normal affect, a&ox3 Skin: c/d/i, no rashes   Bedside US does show deeper cyst, which is possibly the ganglion cyst. It is not currently palpable, possibly due to pain with increased palpation. Area of bogginess is easily compressed with [place] and small amount of fluid visible  Vitals:   06/11/20 0855  BP: 134/81  Pulse: 73  Temp: 98.6 F (37 C)  TempSrc: Oral   SpO2: 100%  Weight: 183 lb 4.8 oz (83.1 kg)  Height: 5\' 5"  (1.651 m)     Assessment & Plan:   See Encounters Tab for problem based charting.  Patient discussed with Dr. Angelia Mould

## 2020-06-11 NOTE — Patient Instructions (Signed)
Thank you for allowing [place] to provide your care today. Today we discussed your right wrist swelling   I have ordered the following labs for you:  Inflammatory labs    I will call with results. Please make sure to follow-up with orthopedic surgery.   I have placed an order for an [place]. You will be called for an appointment.   Today we made the following changes to your medications:   Please START taking  Prednisone 40 mg for 7 days THEN  Prednisone 20 mg for 3 days THEN 10 mg for 3 days THEN  5 mg for 3 days     Please call the internal medicine center clinic if you have any questions or concerns, we may be able to help and keep you from a long and expensive emergency room wait. Our clinic and after hours phone number is [phone], the best time to call is Monday through Friday 9 am to 4 pm but there is always someone available 24/7 if you have an emergency. If you need medication refills please notify your pharmacy one week in advance and they will send [place] a request.

## 2020-06-11 NOTE — Assessment & Plan Note (Addendum)
Right wrist pain for the past six months, constant, worse with flexion and extension. She now has increased swelling of the wrist, hand, and pain with flexion at the elbow. She endorses recent rash on her legs about two weeks ago. She denies pain in other joints. She denies fever, chills. She was previously given oral prednisone and an injection, both of which helped the pain.  She has swelling diffuse swelling of the wrist hand and fingers, with moderate fluctuance along the dorsal aspect of the wrist, mild fluctuance of volar wrist, and small amount of fluctuance in between the distal metacarpal phalangeal joints. She has tenderness to the metacarpals and wrist with palpation and all ROM. No erythema, increased warms or lesions. Her elbow is very slightly swollen.  Bedside US does show deeper cyst, which is possibly the ganglion cyst. It is not currently palpable, possibly due to pain with increased palpation. Area of bogginess is easily compressed with [place].  Doubt DVT as she is on warfarin chronically. She has follow-up with orthopedic surgery once official US is done but she has not received a call for this yet. Possibly secondary to obstruction from ganglion cyst, although has pain and swelling up to the elbow. She has not yet been worked up for possible inflammatory condition.   - ANA, CRP, ESR, RF, CCP  - soft tissue US RUE - follow-up with orthopedics after [place]  - prednisone 40 mg with taper   ADDENDUM: Labs significant for elevated ESR and CRP only. [place] still has not been complete. Discussed labs with her and she will call clinic this afternoon to try to schedule [place]. With diffuse swelling, pain up to the elbow, history of left breast cancer w/mastectomy, and right paratracheal lymphadenopathy in 2018, consider axillary and breast exam at f/u. She also has right screening mammogram ordered but not yet completed.

## 2020-06-13 ENCOUNTER — Other Ambulatory Visit: Payer: Self-pay | Admitting: Pharmacist

## 2020-06-13 LAB — C-REACTIVE PROTEIN: CRP: 39 mg/L — ABNORMAL HIGH (ref 0–10)

## 2020-06-13 LAB — RHEUMATOID FACTOR: Rheumatoid fact SerPl-aCnc: <10 IU/mL (ref <14.0)

## 2020-06-13 LAB — CYCLIC CITRUL PEPTIDE ANTIBODY, IGG/IGA: Cyclic Citrullin Peptide Ab: 7 units (ref 0–19)

## 2020-06-13 LAB — ANTINUCLEAR ANTIBODIES, IFA: ANA Titer 1: NEGATIVE

## 2020-06-13 LAB — SEDIMENTATION RATE: Sed Rate: 82 mm/hr — ABNORMAL HIGH (ref 0–40)

## 2020-06-13 MED ORDER — WARFARIN SODIUM 5 MG PO TABS: ORAL_TABLET | ORAL | 1 refills | Status: DC

## 2020-06-13 NOTE — Telephone Encounter (Signed)
Alerted the patient/daughter that Rx has been electronically sent to her [facility] at [place].

## 2020-06-17 ENCOUNTER — Telehealth: Payer: Self-pay | Admitting: Pharmacist

## 2020-06-17 NOTE — Telephone Encounter (Signed)
Was provided PST FS POC INR result of 2.7 (goal 2.0 - 3.0) on 30mg  warfarin/wk. Advised to continue SAME regimen:  5mg  warfarin daily except on Wednesdays and Friday, then--only 1/2 of her 5mg  (2.5mg  dose) on these days. Repeat INR [id number]. Kim Anthony indicates they have only one remaining test-strip, unless re-authorization of the requirement for home-testing, patient self-testing, point of care INR monitoring is effected. Was given the option for return to clinic in-person INRs. No bleeding signs or symptoms.

## 2020-06-20 ENCOUNTER — Ambulatory Visit: Payer: Medicare Other | Admitting: *Deleted

## 2020-06-20 DIAGNOSIS — I10 Essential (primary) hypertension: Secondary | ICD-10-CM

## 2020-06-20 DIAGNOSIS — M130 Polyarthritis, unspecified: Secondary | ICD-10-CM

## 2020-06-20 DIAGNOSIS — J449 Chronic obstructive pulmonary disease, unspecified: Secondary | ICD-10-CM

## 2020-06-20 DIAGNOSIS — M6740 Ganglion, unspecified site: Secondary | ICD-10-CM

## 2020-06-20 NOTE — Chronic Care Management (AMB) (Signed)
Chronic Care Management   Follow Up Note   06/20/2020 Name: Kim Anthony [name]: [id number] DOB: 30-Jan-1951  Referred by: Dellia Cloud, MD Reason for referral : Chronic Care Management (COPD, Asthma, HF, HTN, HLD, on Coumadin r/t MVR, chronic pain syndrome on opioids)   Kim Anthony is a 69 y.o. year old female who is a primary care patient of Dellia Cloud, MD. The CCM team was consulted for assistance with chronic disease management and care coordination needs.    Review of patient status, including review of consultants reports, relevant laboratory and other test results, and collaboration with appropriate care team members and the patient's provider was performed as part of comprehensive patient evaluation and provision of chronic care management services.    SDOH (Social Determinants of Health) assessments performed: No See Care Plan activities for detailed interventions related to [facility])     Outpatient Encounter Medications as of 06/20/2020  Medication Sig Note  . albuterol (PROVENTIL) (2.5 MG/3ML) 0.083% nebulizer solution Take 3 mLs (2.5 mg total) by nebulization every 6 (six) hours as needed for wheezing or shortness of breath. 11/28/2019: Uses 3 times week during high pollen  . albuterol (VENTOLIN HFA) 108 (90 Base) MCG/ACT inhaler Inhale 1-2 puffs into the lungs every 6 (six) hours as needed for wheezing or shortness of breath. 11/28/2019: Uses twice daily  . Calcium-Phosphorus-Vitamin D (CITRACAL +D3 PO) Take 1 tablet by mouth 2 (two) times a day.   . fluticasone (FLONASE) 50 MCG/ACT nasal spray Place 2 sprays into both nostrils daily. (Patient taking differently: Place 1 spray into both nostrils daily as needed for allergies.) 04/18/2020: Requesting refill  . furosemide (LASIX) 40 MG tablet Take 1 tablet by mouth twice daily   . gabapentin (NEURONTIN) 600 MG tablet TAKE 1 TABLET BY MOUTH  4 TIMES DAILY FOR 4 DAYS THEN  GO  TO  2  TABLETS  BY  MOUTH  3  TIMES  A  DAY  FOR  NERVE  PAIN    . HYDROcodone-acetaminophen (NORCO) 5-325 MG tablet Take 1 tablet by mouth every 4 (four) hours as needed for severe pain (increased to 5x/day as needed).   [name] lisinopril (ZESTRIL) 20 MG tablet Take 1 tablet by mouth once daily   . metoprolol succinate (TOPROL-XL) 25 MG 24 hr tablet Take 1 tablet (25 mg total) by mouth daily.   . potassium chloride SA (KLOR-CON) 20 MEQ tablet Take 2 tablets (40 mEq total) by mouth daily.   . predniSONE (DELTASONE) 10 MG tablet Take 4 tablets (40 mg total) by mouth daily with breakfast for 7 days, THEN 2 tablets (20 mg total) daily with breakfast for 3 days, THEN 1 tablet (10 mg total) daily with breakfast for 3 days, THEN 0.5 tablets (5 mg total) daily with breakfast for 3 days.   . pregabalin (LYRICA) 100 MG capsule Take 1 capsule (100 mg total) by mouth 3 (three) times daily.   . simvastatin (ZOCOR) 20 MG tablet Take 1 tablet by mouth once daily   . Spacer/Aero-Holding Chambers (BREATHERITE COLL SPACER ADULT) MISC 1 applicator by Does not apply route daily.   [name] STIOLTO RESPIMAT 2.5-2.5 MCG/ACT AERS INHALE 2 PUFFS BY MOUTH ONCE DAILY   . warfarin (COUMADIN) 5 MG tablet Take one (1) tablet daily--EXCEPT on Wednesdays and Fridays, take only one-half (1/2) tablet on these days.    No facility-administered encounter medications on file as of 06/20/2020.     Objective:  Wt Readings from Last 3 Encounters:  06/11/20 183  lb 4.8 oz (83.1 kg)  06/03/20 179 lb 12.8 oz (81.6 kg)  05/03/20 185 lb 9.6 oz (84.2 kg)   BP Readings from Last 3 Encounters:  06/11/20 134/81  06/03/20 (!) 161/84  05/03/20 (!) 152/73    Goals Addressed              This Visit's Progress     Patient Stated   .  COMPLETED: I'm taking all my medications like I'm supposed to." (pt-stated)        CARE PLAN ENTRY (see longitudinal plan of care for additional care plan information)  Current Barriers:  . Chronic Disease Management support, education, and care coordination needs related  to CHF, CAD, HTN, HLD, COPD, and Asthma - reached patient via phone successfully and follow up assessment completed, her current complaint is right wrist pain and she completed a telehealth visit yesterday and will be referred to a hand surgeon for consideration of ganglion cyst removal, she said the pain was "really bad" last night despite wearing the wrist brace and taking her chronic opioid pain medication, she says she hopes she can get in to see the hand surgeon soon, she says she get short of breath anytime she goes out when it's windy and especially after she smokes outside when it's windy, she says the use of her nebulizer relives her symptoms, she says she is not ready to stop smoking although she knows she should, denies nocturnal dyspnea, she reports good medication taking behavior. She agrees the last time her INR was checked was in September , after repeated discussion of the importance of regular INR checks and that her INR was not therapeutic in September,  patient agrees to ask her daughter to check her INR tonight when she gets home from work and she will text the results to Dr Alexandria LodgeGroce- [facility],  says she continues to receive visits from [facility] as a participant in their Home Based Primary Care Program with the last visit occurring on 04/30/20  Clinical Goal(s) related to CHF, CAD, HTN, HLD, COPD, and asthma :  Over the next 30 - 60 days, patient will:  . Work with the care management team to address educational, disease management, and care coordination needs  . Begin or continue self health monitoring activities as directed today Measure and record blood pressure 1-2 times per week and as needed  when you are able to get a home BP monitor, monitor O2 saturations when work of breathing changes from baseline . Call provider office for new or worsened signs and symptoms of CHF, HTN, HLD, COPD, and Asthma . Call care management team with questions or  concerns . Verbalize basic understanding of patient centered plan of care established today  Interventions related to CHF, CAD, HTN, HLD, COPD, and Asthma :  . Evaluation of current treatment plans and patient's adherence to plan as established by provider . Assessed patient's education and care coordination needs . Reinforced the importance of INR monitoring while on Coumadin, reviewed results of last INR done 9/8 with subtherapeutic levels and requested she have daughter check INR tonight and text results to Dr. Alexandria LodgeGroce since Coumadin dose may need adjustment . Verified with patient that she continues to receive home based primary care via [facility] - per chart review most recent home visit was 04/30/20 . Collaborated with appropriate clinical care team members regarding patient needs- will notify Dr Alexandria LodgeGroce that patient has been asked to have daughter check INR tonight  and text him the results  . Encouraged patient to call the clinic at the end of the week to check on status of referral to hand surgeon    Patient Self Care Activities related to CHF, HTN, HLD, COPD, and Asthma :  . Patient is unable to independently self-manage chronic health conditions  Please see  updated [employer] care plan    .  Track and Manage My Symptoms-COPD- "I know when to use my nebulizer machine." (pt-stated)        Timeframe:  Long-Range Goal Priority:  High Start Date:          06/20/20                   Expected End Date:                       Follow Up Date 07/22/20   - eliminate symptom triggers at home - follow rescue plan if symptoms flare-up - keep follow-up appointments - use an extra pillow to sleep if needed   Why is this important?    Tracking your symptoms and other information about your health helps your doctor plan your care.   Write down the symptoms, the time of day, what you were doing and what medicine you are taking.   You will soon learn how to manage your symptoms.     Notes:        Other   .  Cope with Pain in right wrist        Timeframe:  Short-Term Goal Priority:  High Start Date:      06/20/20                       Expected End Date:   08/3120                    Follow Up Date 07/22/20   - keep all provider appointments  - use distraction techniques - use relaxation during pain   -wear right wrist brace - contact provider for unrelieved severe pain Why is this important?    Living with back pain and enjoying your life may be hard.   Feelings, such as depression or anger, can make your pain worse.   Learning ways to cope may help you find some relief from the pain.    Notes:        Patient Care Plan: Chronic Pain (Adult)    Problem Identified: Pain Management Plan (Chronic Pain)     Problem Identified: Chronic Pain Management (Chronic Pain)     Goal: Chronic Pain Managed- acute on chronic right wrist pain is managed   Start Date: 06/20/2020  Expected End Date: 09/19/2020  This Visit's Progress: On track  Priority: High  Note:   CARE PLAN ENTRY (see longtitudinal plan of care for additional care plan information)  Current Barriers:  [name] Knowledge Deficits related to managing acute/chronic pain- patient states she is still suffering with right writs pain and that the prednisone burst prescribed on 06/11/20 has been ineffective in managing the pain. She says she is waiting on an ultrasound of her wrist so the "hand doctor" can recommend a treatment plan, she says she is wearing the wrist brace and taking her medications as prescribed . Chronic Disease Management support and education needs related to chronic pain  Nurse Case Manager Clinical Goal(s):  [name] Over the next 30-60 days, patient will verbalize understanding  of plan for managing pain . Over the next 30-60  days, patient will attend all scheduled medical and imaging appointments:   Interventions:  . Evaluation of current treatment plan related to right wrist pain and patient's adherence  to plan as established by provider. . Advised patient to keep all scheduled appointments including imaging appointments . Reviewed medications with patient and discussed effects of Prednisone on inflammation . Discussed plans with patient for ongoing care management follow up and provided patient with direct contact information for care management team  Patient Self Care Activities:  . Patient verbalizes understanding of plan to work with providers in treating right wrist pain . Self administers medications as prescribed . Attends all scheduled provider appointments . Calls pharmacy for medication refills . Calls provider office for new concerns or questions .  keep all provider appointments  - use distraction techniques - use relaxation during pain   -wear right wrist brace - contact provider for unrelieved severe pain Follow up Plan: The care management team will reach out to the patient again over the next 30-60 days.    Patient Care Plan: COPD (Adult)    Problem Identified: Symptom Exacerbation (COPD)- call provider when symptoms not improving despite     Long-Range Goal: Symptom Exacerbation Prevented or Minimized to prevent hosptailzatio or need for emergency treatment   Start Date: 06/20/2020  Expected End Date: 09/19/2020  This Visit's Progress: On track  Priority: High  Note:   Current Barriers:  [name] Knowledge deficits related to basic COPD self care/management   Case Manager Clinical Goal(s):  Over the next 30-60 days, patient will not be hospitalized for COPD exacerbation  Interventions:   Advised patient to self assesses COPD action plan zone and make appointment with provider if in the yellow zone for 48 hours without improvement.  Provided education about and advised patient to utilize infection prevention strategies to reduce risk of respiratory infection  Patient Goals/Self-Care Activities:  . eliminate symptom triggers at home . - follow rescue plan if symptoms  flare-up . - keep follow-up appointments . - use an extra pillow to sleep if needed Follow Up Plan: The care management team will reach out to the patient again over the next 30-60 days.       Plan:   The care management team will reach out to the patient again over the next 30-60 days.    Kelli Churn RN, CCM, [facility] RN Care Manager [phone]

## 2020-06-20 NOTE — Patient Instructions (Signed)
Visit Information It was nice speaking with you today. Patient Care Plan: Chronic Pain (Adult)    Problem Identified: Pain Management Plan (Chronic Pain)     Problem Identified: Chronic Pain Management (Chronic Pain)     Goal: Chronic Pain Managed- acute on chronic right wrist pain is managed   Start Date: 06/20/2020  Expected End Date: 09/19/2020  This Visit's Progress: On track  Priority: High  Note:   CARE PLAN ENTRY (see longtitudinal plan of care for additional care plan information)  Current Barriers:  [name] Knowledge Deficits related to managing acute/chronic pain- patient states she is still suffering with right writs pain and that the prednisone burst prescribed on 06/11/20 has been ineffective in managing the pain. She says she is waiting on an ultrasound of her wrist so the "hand doctor" can recommend a treatment plan, she says she is wearing the wrist brace and taking her medications as prescribed . Chronic Disease Management support and education needs related to chronic pain  Nurse Case Manager Clinical Goal(s):  [name] Over the next 30-60 days, patient will verbalize understanding of plan for managing pain . Over the next 30-60  days, patient will attend all scheduled medical and imaging appointments:   Interventions:  . Evaluation of current treatment plan related to right wrist pain and patient's adherence to plan as established by provider. . Advised patient to keep all scheduled appointments including imaging appointments . Reviewed medications with patient and discussed effects of Prednisone on inflammation . Discussed plans with patient for ongoing care management follow up and provided patient with direct contact information for care management team  Patient Self Care Activities:  . Patient verbalizes understanding of plan to work with providers in treating right wrist pain . Self administers medications as prescribed . Attends all scheduled provider  appointments . Calls pharmacy for medication refills . Calls provider office for new concerns or questions .  keep all provider appointments  - use distraction techniques - use relaxation during pain   -wear right wrist brace - contact provider for unrelieved severe pain Follow up Plan: The care management team will reach out to the patient again over the next 30-60 days.    Patient Care Plan: COPD (Adult)    Problem Identified: Symptom Exacerbation (COPD)- call provider when symptoms not improving despite     Long-Range Goal: Symptom Exacerbation Prevented or Minimized to prevent hosptailzatio or need for emergency treatment   Start Date: 06/20/2020  Expected End Date: 09/19/2020  This Visit's Progress: On track  Priority: High  Note:   Current Barriers:  [name] Knowledge deficits related to basic COPD self care/management   Case Manager Clinical Goal(s):  Over the next 30-60 days, patient will not be hospitalized for COPD exacerbation  Interventions:   Advised patient to self assesses COPD action plan zone and make appointment with provider if in the yellow zone for 48 hours without improvement.  Provided education about and advised patient to utilize infection prevention strategies to reduce risk of respiratory infection  Patient Goals/Self-Care Activities:  . eliminate symptom triggers at home . - follow rescue plan if symptoms flare-up . - keep follow-up appointments . - use an extra pillow to sleep if needed Follow Up Plan: The care management team will reach out to the patient again over the next 30-60 days.       The patient verbalized understanding of instructions, educational materials, and care plan provided today and declined offer to receive copy of patient instructions, educational  materials, and care plan.   The care management team will reach out to the patient again over the next 30-60 days.   Cranford Mon RN, CCM, [facility] RN Care  Manager [phone]

## 2020-06-24 ENCOUNTER — Other Ambulatory Visit: Payer: Self-pay | Admitting: Physical Medicine and Rehabilitation

## 2020-06-24 DIAGNOSIS — G8929 Other chronic pain: Secondary | ICD-10-CM

## 2020-06-26 NOTE — Progress Notes (Signed)
Internal Medicine Clinic Attending  Case discussed with Dr. Seawell  At the time of the visit.  We reviewed the resident's history and exam and pertinent patient test results.  I agree with the assessment, diagnosis, and plan of care documented in the resident's note.  

## 2020-06-28 ENCOUNTER — Encounter: Payer: Self-pay | Admitting: Internal Medicine

## 2020-06-28 ENCOUNTER — Ambulatory Visit: Payer: Medicare Other | Admitting: Internal Medicine

## 2020-06-28 ENCOUNTER — Other Ambulatory Visit: Payer: Self-pay

## 2020-06-28 DIAGNOSIS — M6740 Ganglion, unspecified site: Secondary | ICD-10-CM

## 2020-06-28 NOTE — Progress Notes (Signed)
Skis to discuss her telehealth appointment today.  She states that she has no acute concerns other than wanting to know the time and date of her wrist ultrasound.  I told her that her wrist ultrasounds on the 12th 1230 at [facility]. I reminded her to get her mammogram. She admits understanding.  Lawerance Cruel, D.O.  Internal Medicine Resident, PGY-2 [facility]  Pager: [phone] 1:25 PM, 06/28/2020

## 2020-07-01 NOTE — Progress Notes (Signed)
Internal Medicine Clinic Attending  Case discussed with Dr. Coe  At the time of the visit.  We reviewed the resident's history and exam and pertinent patient test results.  I agree with the assessment, diagnosis, and plan of care documented in the resident's note.  

## 2020-07-03 ENCOUNTER — Telehealth: Payer: Self-pay

## 2020-07-03 ENCOUNTER — Ambulatory Visit (HOSPITAL_COMMUNITY)
Admission: RE | Admit: 2020-07-03 | Discharge: 2020-07-03 | Disposition: A | Discharge status: Home / Self Care | Payer: Medicare Other | Source: Ambulatory Visit | Attending: Internal Medicine | Admitting: Internal Medicine

## 2020-07-03 ENCOUNTER — Other Ambulatory Visit: Payer: Self-pay

## 2020-07-03 DIAGNOSIS — M130 Polyarthritis, unspecified: Secondary | ICD-10-CM | POA: Diagnosis not present

## 2020-07-05 ENCOUNTER — Ambulatory Visit: Payer: Medicare Other | Admitting: *Deleted

## 2020-07-05 DIAGNOSIS — E785 Hyperlipidemia, unspecified: Secondary | ICD-10-CM

## 2020-07-05 DIAGNOSIS — J449 Chronic obstructive pulmonary disease, unspecified: Secondary | ICD-10-CM

## 2020-07-05 DIAGNOSIS — I5042 Chronic combined systolic (congestive) and diastolic (congestive) heart failure: Secondary | ICD-10-CM

## 2020-07-05 DIAGNOSIS — M130 Polyarthritis, unspecified: Secondary | ICD-10-CM

## 2020-07-05 DIAGNOSIS — I10 Essential (primary) hypertension: Secondary | ICD-10-CM

## 2020-07-05 DIAGNOSIS — M6740 Ganglion, unspecified site: Secondary | ICD-10-CM

## 2020-07-05 NOTE — Progress Notes (Signed)
Internal Medicine Clinic Resident  I have personally reviewed this encounter including the documentation in this note and/or discussed this patient with the care management provider. I will address any urgent items identified by the care management provider and will communicate my actions to the patient's PCP. I have reviewed the patient's CCM visit with my supervising attending, Dr Jimmye Norman.  Harvie Heck, MD  [facility] PGY-2 07/05/2020

## 2020-07-05 NOTE — Chronic Care Management (AMB) (Signed)
   07/05/2020  Kim Anthony 1950-11-17 [id number]  Returned call to patient in response to voice mail left yesterday at 4:09 pm.  Patient states she had an ultrasound of her right wrist on 07/03/20. She says her wrist is very swollen and painful and she would like to talk to Dr. Marianna Payment about next steps.  Follow up Plan: Will route this note to Dr Marianna Payment.  Kelli Churn RN, CCM, [facility] RN Care Manager [phone]

## 2020-07-05 NOTE — Progress Notes (Signed)
Dr. Marva Panda will f/u with patient (PCP Dr. Marianna Payment has also been messaged).  Ongoing w/u of chronic wrist pain suspected to be in part related to a gangion cyst, though no cyst identified on very recent US.  She has been evaluated for this recently by ortho Dr. Dwyane Dee and PMR Dr. Lorelee Market.  CAre is currently fragmented and she is needing direction on next steps.

## 2020-07-06 NOTE — Progress Notes (Signed)
   Covid-19 Vaccination Clinic  Name:  Kim Anthony    [name]: [id number] DOB: 1950-09-30  07/06/2020  [name] was observed post Covid-19 immunization for 15 minutes without incident. She was provided with Vaccine Information Sheet and instruction to access the V-Safe system.   [name] was instructed to call 911 with any severe reactions post vaccine: [name] Difficulty breathing  . Swelling of face and throat  . A fast heartbeat  . A bad rash all over body  . Dizziness and weakness   Immunizations Administered    Name Date Dose VIS Date Route   Moderna Covid-19 Booster Vaccine 04/24/2020  5:55 PM 0.25 mL 04/10/2020 Intramuscular   Manufacturer: Moderna   Lot: [id number]   [place]: 59935-701-77

## 2020-07-15 ENCOUNTER — Telehealth: Payer: Self-pay

## 2020-07-15 NOTE — Telephone Encounter (Signed)
Patient called requesting refill on Hydrocodone. Last filled #150 on 06/14/20, next app 09/04/20.

## 2020-07-16 ENCOUNTER — Telehealth: Payer: Self-pay | Admitting: *Deleted

## 2020-07-16 MED ORDER — HYDROCODONE-ACETAMINOPHEN 5-325 MG PO TABS: 1.0000 | ORAL_TABLET | ORAL | 0 refills | Status: DC | PRN

## 2020-07-16 NOTE — Telephone Encounter (Signed)
Refilling Norco 5/325 #150 last filled 06/14/20.

## 2020-07-16 NOTE — Telephone Encounter (Signed)
Pharmacy called and needs clarification on the hydrocodone order for Kim Anthony.  Two different sigs are on the Rx. 1st one is "One table q 4 hours prn" (which is 6 x day), and in parenthesis it says may increase to 5 x per day.

## 2020-07-16 NOTE — Telephone Encounter (Signed)
Notified. 

## 2020-07-16 NOTE — Telephone Encounter (Signed)
Spoke with the pharmacist

## 2020-07-16 NOTE — Telephone Encounter (Signed)
She can take it q4 hours prn, but a max of 5 tabs/day Thanks, ML

## 2020-07-17 ENCOUNTER — Other Ambulatory Visit: Payer: Self-pay | Admitting: Physical Medicine and Rehabilitation

## 2020-07-17 ENCOUNTER — Telehealth: Payer: Self-pay | Admitting: Internal Medicine

## 2020-07-17 ENCOUNTER — Other Ambulatory Visit: Payer: Self-pay | Admitting: Internal Medicine

## 2020-07-17 ENCOUNTER — Other Ambulatory Visit: Payer: Self-pay | Admitting: Student

## 2020-07-17 DIAGNOSIS — J449 Chronic obstructive pulmonary disease, unspecified: Secondary | ICD-10-CM

## 2020-07-17 DIAGNOSIS — R0602 Shortness of breath: Secondary | ICD-10-CM

## 2020-07-17 DIAGNOSIS — M6740 Ganglion, unspecified site: Secondary | ICD-10-CM

## 2020-07-17 MED ORDER — DICLOFENAC SODIUM 1 % EX GEL: 4.0000 g | Freq: Four times a day (QID) | CUTANEOUS | 2 refills | Status: DC

## 2020-07-17 NOTE — Telephone Encounter (Signed)
Call patient regarding wrist pain.  Patient was recently seen by Dr. Marva Panda who ordered a wrist ultrasound to further evaluate her wrist pain and lump.  Patient states that she has not been notified of her results and continues to have pain in the wrist.  I notified her that her ultrasound was within normal limits without any signs of lesions or masses.  Currently seeing pain management who gave her topical lidocaine but states this breaks her skin out.  I offered her prescription for Voltaren gel to help with her wrist pain and counseled her to return to her office for further evaluation in the near future.  Lawerance Cruel, D.O.  Internal Medicine Resident, PGY-2 [facility]  Pager: [phone] 12:53 PM, 07/17/2020

## 2020-07-17 NOTE — Telephone Encounter (Signed)
-----   Message from Barrington Ellison, RN sent at 07/05/2020 10:29 AM EST ----- Patient requesting to speak with Dr Marianna Payment about her wrist pain. Thanks, Marcie Bal

## 2020-07-19 ENCOUNTER — Ambulatory Visit: Payer: Medicare Other | Admitting: *Deleted

## 2020-07-19 DIAGNOSIS — I5042 Chronic combined systolic (congestive) and diastolic (congestive) heart failure: Secondary | ICD-10-CM

## 2020-07-19 DIAGNOSIS — I10 Essential (primary) hypertension: Secondary | ICD-10-CM

## 2020-07-19 DIAGNOSIS — J449 Chronic obstructive pulmonary disease, unspecified: Secondary | ICD-10-CM

## 2020-07-19 DIAGNOSIS — M130 Polyarthritis, unspecified: Secondary | ICD-10-CM

## 2020-07-19 NOTE — Chronic Care Management (AMB) (Signed)
Chronic Care Management   CCM RN Visit Note  07/19/2020 Name: Kim Anthony [name]: [id number] DOB: 11-28-1950  Subjective: Kim Anthony is a 70 y.o. year old female who is a primary care patient of Marianna Payment, MD. The care management team was consulted for assistance with disease management and care coordination needs.    Engaged with patient by telephone for follow up visit in response to provider referral for case management and/or care coordination services.   Consent to Services:  The patient was given information about Chronic Care Management services, agreed to services, and gave verbal consent prior to initiation of services.  Please see initial visit note for detailed documentation.   Patient agreed to services and verbal consent obtained.   Assessment: Review of patient past medical history, allergies, medications, health status, including review of consultants reports, laboratory and other test data, was performed as part of comprehensive evaluation and provision of chronic care management services.   SDOH (Social Determinants of Health) assessments and interventions performed:    CCM Care Plan  Allergies  Allergen Reactions  . Acetaminophen-Codeine Itching  . Propoxyphene Itching    Darvocet  . Tape Rash    Plastic  . Tramadol Itching and Nausea And Vomiting    Outpatient Encounter Medications as of 07/19/2020  Medication Sig Note  . albuterol (PROVENTIL) (2.5 MG/3ML) 0.083% nebulizer solution USE 1 VIAL IN NEBULIZER EVERY 6 HOURS AS NEEDED FOR WHEEZING FOR SHORTNESS OF BREATH   . albuterol (VENTOLIN HFA) 108 (90 Base) MCG/ACT inhaler Inhale 1-2 puffs into the lungs every 6 (six) hours as needed for wheezing or shortness of breath. 11/28/2019: Uses twice daily  . Calcium-Phosphorus-Vitamin D (CITRACAL +D3 PO) Take 1 tablet by mouth 2 (two) times a day.   . diclofenac Sodium (VOLTAREN) 1 % GEL Apply 4 g topically 4 (four) times daily.   . fluticasone (FLONASE) 50 MCG/ACT  nasal spray Place 2 sprays into both nostrils daily. (Patient taking differently: Place 1 spray into both nostrils daily as needed for allergies.) 04/18/2020: Requesting refill  . furosemide (LASIX) 40 MG tablet Take 1 tablet by mouth twice daily   . gabapentin (NEURONTIN) 600 MG tablet Take 1 tablet (600 mg total) by mouth 3 (three) times daily. She was supposed to wean down on gabapentin- since was on Lyrica- wean to 600 mg 3x/day then reduce dose by 300 mg/week to off, if possible-   . HYDROcodone-acetaminophen (NORCO) 5-325 MG tablet Take 1 tablet by mouth every 4 (four) hours as needed for severe pain (increased to 5x/day as needed).   [name] lisinopril (ZESTRIL) 20 MG tablet Take 1 tablet by mouth once daily   . metoprolol succinate (TOPROL-XL) 25 MG 24 hr tablet Take 1 tablet (25 mg total) by mouth daily.   . potassium chloride SA (KLOR-CON) 20 MEQ tablet Take 2 tablets (40 mEq total) by mouth daily.   . pregabalin (LYRICA) 100 MG capsule TAKE 1 CAPSULE BY MOUTH THREE TIMES DAILY   . simvastatin (ZOCOR) 20 MG tablet Take 1 tablet by mouth once daily   . Spacer/Aero-Holding Chambers (BREATHERITE COLL SPACER ADULT) MISC 1 applicator by Does not apply route daily.   [name] STIOLTO RESPIMAT 2.5-2.5 MCG/ACT AERS INHALE 2 PUFFS BY MOUTH ONCE DAILY   . warfarin (COUMADIN) 5 MG tablet Take one (1) tablet daily--EXCEPT on Wednesdays and Fridays, take only one-half (1/2) tablet on these days.    No facility-administered encounter medications on file as of 07/19/2020.    Patient Active Problem  List   Diagnosis Date Noted  . Eye redness 02/15/2020  . Shortness of breath 02/09/2020  . Chronic pain syndrome 01/17/2020  . Monoarthritis of wrist 10/18/2019  . Ganglion cyst 09/21/2019  . Hip pain 08/23/2019  . Thoracic ascending aortic aneurysm ([place]) 07/05/2019  . Pustular rash 03/09/2019  . Anemia 02/16/2019  . Breast cancer ([place])   . Acquired absence of left breast 12/20/2018  . Open wound of axillary  region 05/11/2018  . COPD (chronic obstructive pulmonary disease) ([place]) 11/25/2017  . S/P MVR (mitral valve replacement)   . Chronic anticoagulation   . Tobacco use disorder 02/25/2017  . Lymphadenopathy, mediastinal 02/25/2017  . History of mitral valve replacement with mechanical valve 11/18/2016  . Essential hypertension 02/01/2016  . Allergic rhinitis 02/01/2016  . Healthcare maintenance 01/30/2016  . Chronic low back pain 07/22/2015  . Pulmonary embolism ([place]) 07/11/2015  . Chronic combined systolic and diastolic congestive heart failure ([place]) 07/11/2015  . Headache, common migraine 07/11/2015  . HLD (hyperlipidemia) 07/11/2015    Conditions to be addressed/monitored:COPD, Asthma, HF, HTN, HLD, on Coumadin r/t MVR, chronic back pain   Care Plan : Chronic Pain (Adult)  Updates made by Barrington Ellison, RN since 07/19/2020 12:00 AM    Problem: Chronic Pain Management (Chronic Pain)     Goal: Chronic Pain Managed- acute on chronic right wrist pain is managed   Start Date: 06/20/2020  Expected End Date: 09/19/2020  Recent Progress: On track  Priority: High  Note:   CARE PLAN ENTRY (see longitudinal plan of care for additional care plan information)  Current Barriers:  [name] Knowledge Deficits related to managing acute/chronic pain- patient states she spoke with Dr Marianna Payment and the ultrasound of her wrist showed arthritis and he prescribed Voltaren Gel but it is not really helping, she says she is wearing the wrist brace and taking her medications as prescribed, says her daughter checked her INR with the home INR machine about 2 weeks ago, she says she was recently tested for Covid because she was exposed by a family member but fortunately she tested negative . Chronic Disease Management support and education needs related to chronic pain  Nurse Case Manager Clinical Goal(s):  [name] Over the next 30-60 days, patient will verbalize understanding of plan for managing pain . Over the next 30-60   days, patient will attend all scheduled medical appointments:   Interventions:  . Evaluation of current treatment plan related to right wrist pain and patient's adherence to plan as established by provider. . Advised patient to keep all scheduled appointments including imaging appointments . Reviewed medications with patient and discussed effects of Prednisone on inflammation . Discussed plans with patient for ongoing care management follow up and provided patient with direct contact information for care management team  Patient Self Care Activities:  . Patient verbalizes understanding of plan to work with providers in treating right wrist pain . Self administers medications as prescribed . Attends all scheduled provider appointments . Calls pharmacy for medication refills . Calls provider office for new concerns or questions .  keep all provider appointments  - use distraction techniques - use relaxation during pain   -wear right wrist brace - contact provider for unrelieved severe pain Follow up Plan: The care management team will reach out to the patient again over the next 30-60 days.    Care Plan : COPD (Adult)  Updates made by Barrington Ellison, RN since 07/19/2020 12:00 AM    Problem: Symptom Exacerbation (  COPD)- call provider when symptoms not improving despite   Priority: High    Long-Range Goal: Symptom Exacerbation Prevented or Minimized to prevent hosptailzatio or need for emergency treatment   Start Date: 06/20/2020  Expected End Date: 09/19/2020  Recent Progress: On track  Priority: High  Note:   Current Barriers:  [name] Knowledge deficits related to basic COPD self care/management- denies recent COPD exacerbation, fortunately tested negative for Covid after exposure by family member, she says she continues to receive monthly visits from [facility] as part of their primary care home based program    Case Manager Clinical Goal(s):  Over the next 30-60 days, patient will  not be hospitalized for COPD exacerbation  Interventions:   Advised patient to self assesses COPD action plan zone and make appointment with provider if in the yellow zone for 48 hours without improvement.  Provided education about and advised patient to utilize infection prevention strategies to reduce risk of respiratory infection   Verified with patient that she continues to receive monthly visits from [facility] Patient Goals/Self-Care Activities:  . eliminate symptom triggers at home . - follow rescue plan if symptoms flare-up . - keep follow-up appointments . - use an extra pillow to sleep if needed Follow Up Plan: The care management team will reach out to the patient again over the next 30-60 days.       Plan:The care management team will reach out to the patient again over the next 30-60 days.   Kelli Churn RN, CCM, [facility] RN Care Manager [phone]

## 2020-07-19 NOTE — Patient Instructions (Signed)
Visit Information It was nice speaking with you today. Patient Care Plan: Chronic Pain (Adult)    Problem Identified: Chronic Pain Management (Chronic Pain)     Goal: Chronic Pain Managed- acute on chronic right wrist pain is managed   Start Date: 06/20/2020  Expected End Date: 09/19/2020  Recent Progress: On track  Priority: High  Note:   CARE PLAN ENTRY (see longitudinal plan of care for additional care plan information)  Current Barriers:  [name] Knowledge Deficits related to managing acute/chronic pain- patient states she spoke with Dr Marianna Payment and the ultrasound of her wrist showed arthritis and he prescribed Voltaren Gel but it is not really helping, she says she is wearing the wrist brace and taking her medications as prescribed, says her daughter checked her INR with the home INR machine about 2 weeks ago, she says she was recently tested for Covid because she was exposed by a family member but fortunately she tested negative . Chronic Disease Management support and education needs related to chronic pain  Nurse Case Manager Clinical Goal(s):  [name] Over the next 30-60 days, patient will verbalize understanding of plan for managing pain . Over the next 30-60  days, patient will attend all scheduled medical appointments:   Interventions:  . Evaluation of current treatment plan related to right wrist pain and patient's adherence to plan as established by provider. . Advised patient to keep all scheduled appointments including imaging appointments . Reviewed medications with patient and discussed effects of Prednisone on inflammation . Discussed plans with patient for ongoing care management follow up and provided patient with direct contact information for care management team  Patient Self Care Activities:  . Patient verbalizes understanding of plan to work with providers in treating right wrist pain . Self administers medications as prescribed . Attends all scheduled provider  appointments . Calls pharmacy for medication refills . Calls provider office for new concerns or questions .  keep all provider appointments  - use distraction techniques - use relaxation during pain   -wear right wrist brace - contact provider for unrelieved severe pain Follow up Plan: The care management team will reach out to the patient again over the next 30-60 days.    Patient Care Plan: COPD (Adult)    Problem Identified: Symptom Exacerbation (COPD)- call provider when symptoms not improving despite   Priority: High    Long-Range Goal: Symptom Exacerbation Prevented or Minimized to prevent hosptailzatio or need for emergency treatment   Start Date: 06/20/2020  Expected End Date: 09/19/2020  Recent Progress: On track  Priority: High  Note:   Current Barriers:  [name] Knowledge deficits related to basic COPD self care/management- denies recent COPD exacerbation, fortunately tested negative for Covid after exposure by family member, she says she continues to receive monthly visits from [facility] as part of their primary care home based program    Case Manager Clinical Goal(s):  Over the next 30-60 days, patient will not be hospitalized for COPD exacerbation  Interventions:   Advised patient to self assesses COPD action plan zone and make appointment with provider if in the yellow zone for 48 hours without improvement.  Provided education about and advised patient to utilize infection prevention strategies to reduce risk of respiratory infection   Verified with patient that she continues to receive monthly visits from [facility] Patient Goals/Self-Care Activities:  . eliminate symptom triggers at home . - follow rescue plan if symptoms flare-up . - keep follow-up appointments . - use an  extra pillow to sleep if needed Follow Up Plan: The care management team will reach out to the patient again over the next 30-60 days.        The patient verbalized understanding of  instructions, educational materials, and care plan provided today and declined offer to receive copy of patient instructions, educational materials, and care plan.   The care management team will reach out to the patient again over the next 30-60 days.    Kelli Churn RN, CCM, [facility] RN Care Manager [phone]

## 2020-07-20 NOTE — Progress Notes (Signed)
Internal Medicine Clinic Resident  I have personally reviewed this encounter including the documentation in this note and/or discussed this patient with the care management provider. I will address any urgent items identified by the care management provider and will communicate my actions to the patient's PCP. I have reviewed the patient's CCM visit with my supervising attending, Dr Williams.  Betul Brisky, MD  [facility] PGY-2 07/20/2020    

## 2020-07-24 NOTE — Progress Notes (Signed)
CCM encounter reviewed and plan approved as noted by Dr. Aslam. 

## 2020-07-29 ENCOUNTER — Encounter: Payer: Medicare Other | Admitting: Student

## 2020-08-05 IMAGING — DX DG LUMBAR SPINE COMPLETE 4+V
5 series · 5 of 5 positions shown · non-contrast
Comparison: Plain films lumbar spine 05/15/2019.

CLINICAL DATA: Low back pain radiating to the right hip, groin and
leg since 06/08/2019. No known injury.

EXAM:
LUMBAR SPINE - COMPLETE 4+ VIEW

[t lumbar spine ap]
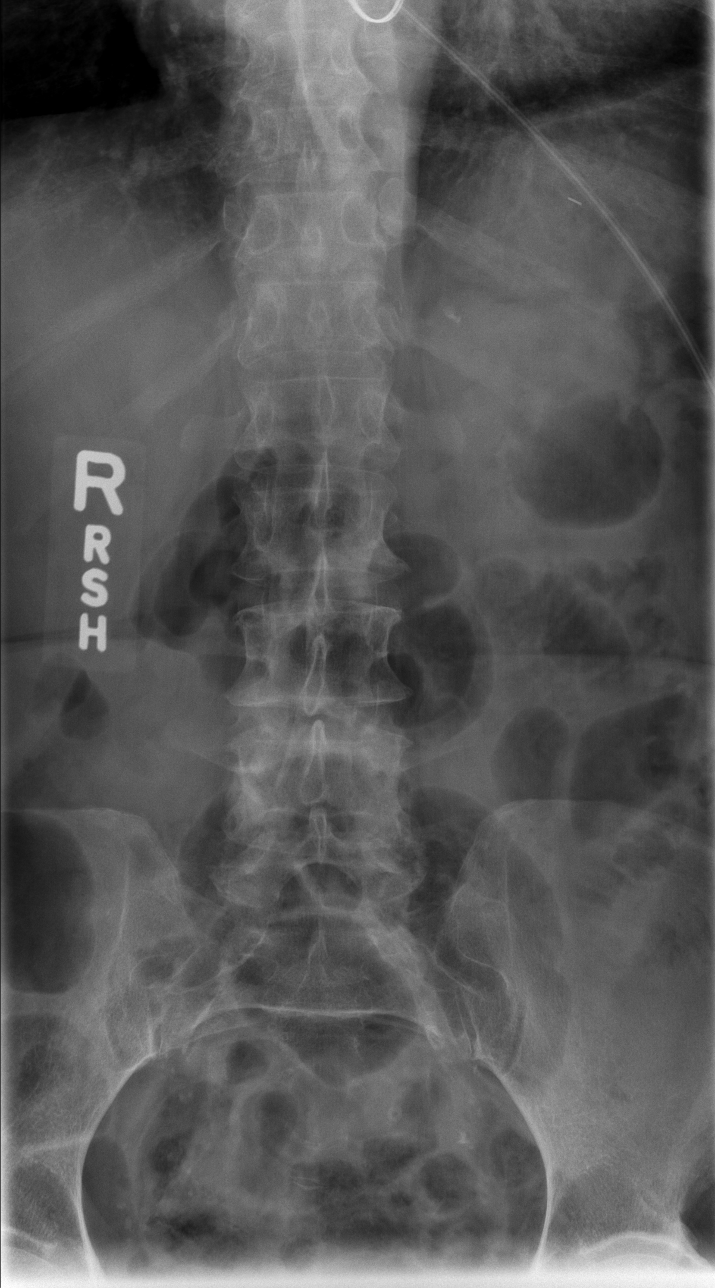

[t lumbar spine obl (1 of 2)]
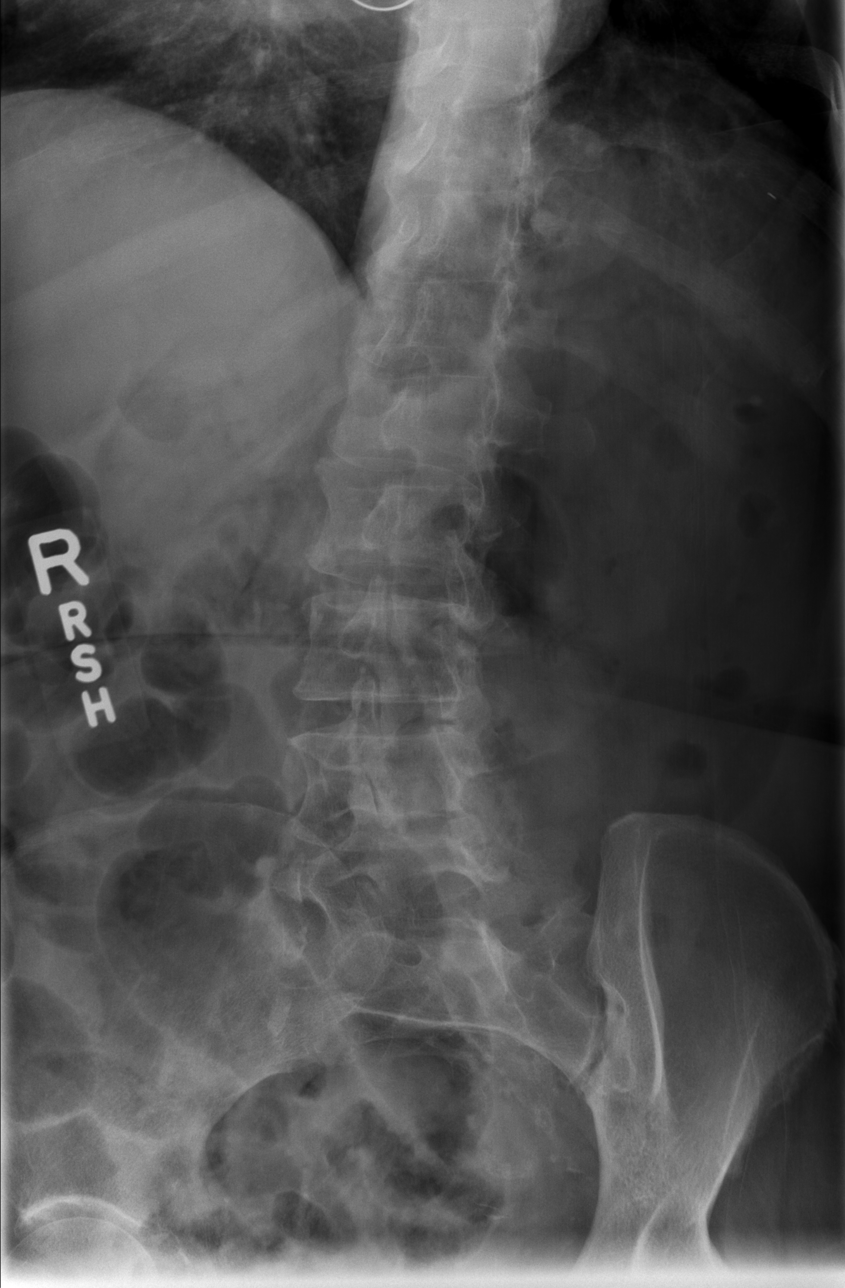

[t lumbar spine obl (2 of 2)]
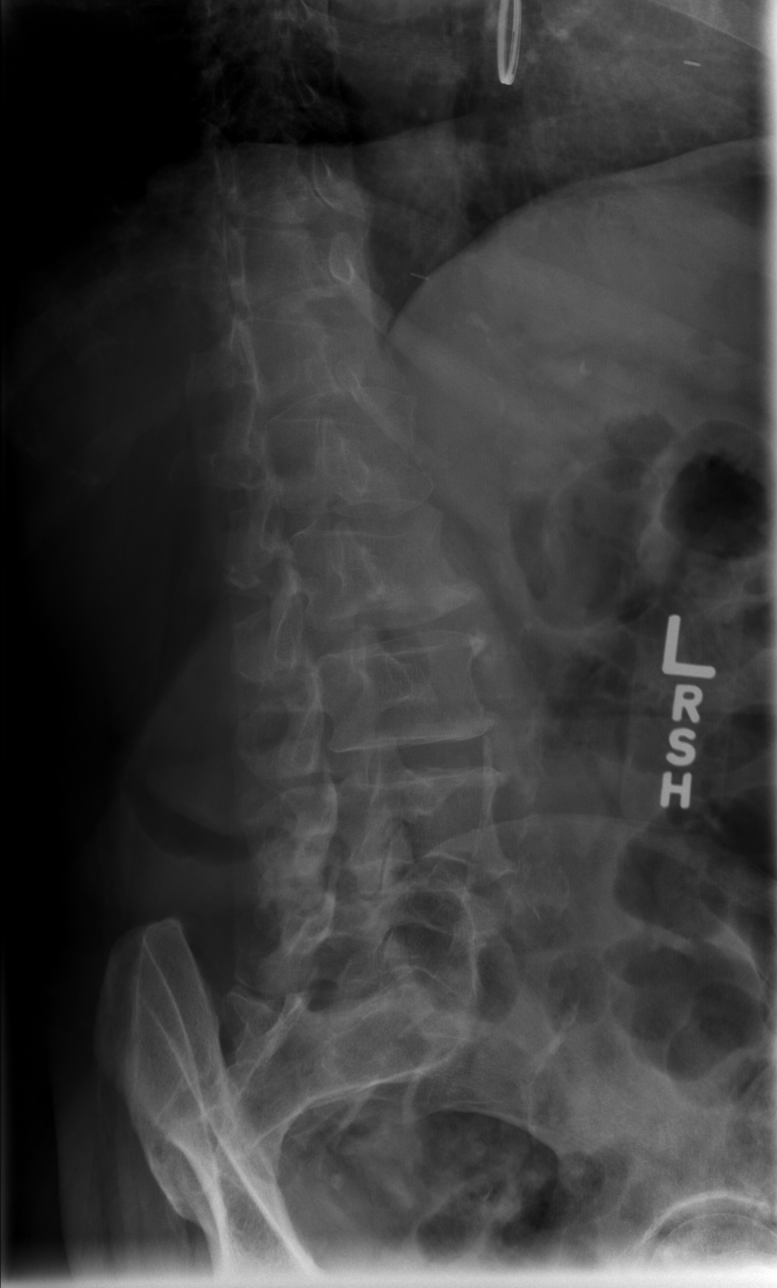

[t lumbar spine lat]
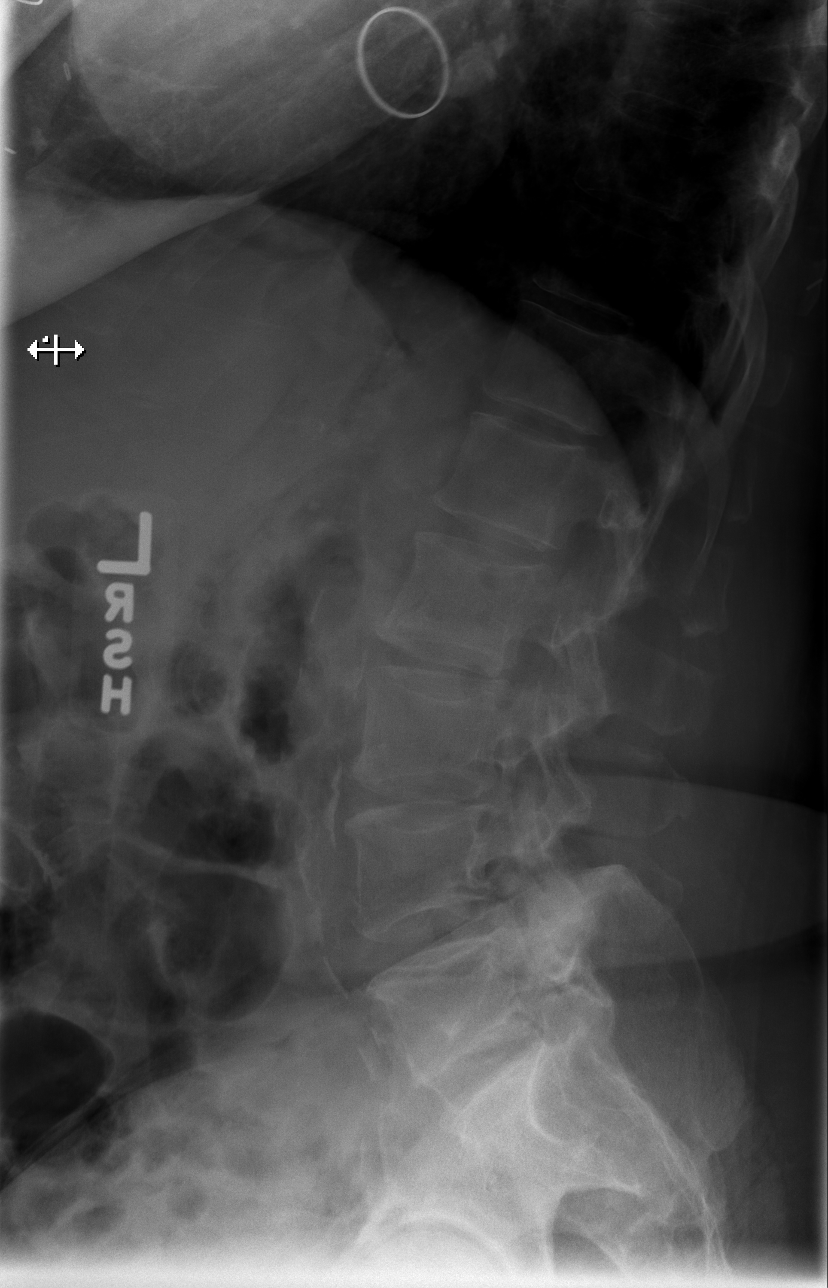

[t lumbar l-5 s-1 spot]
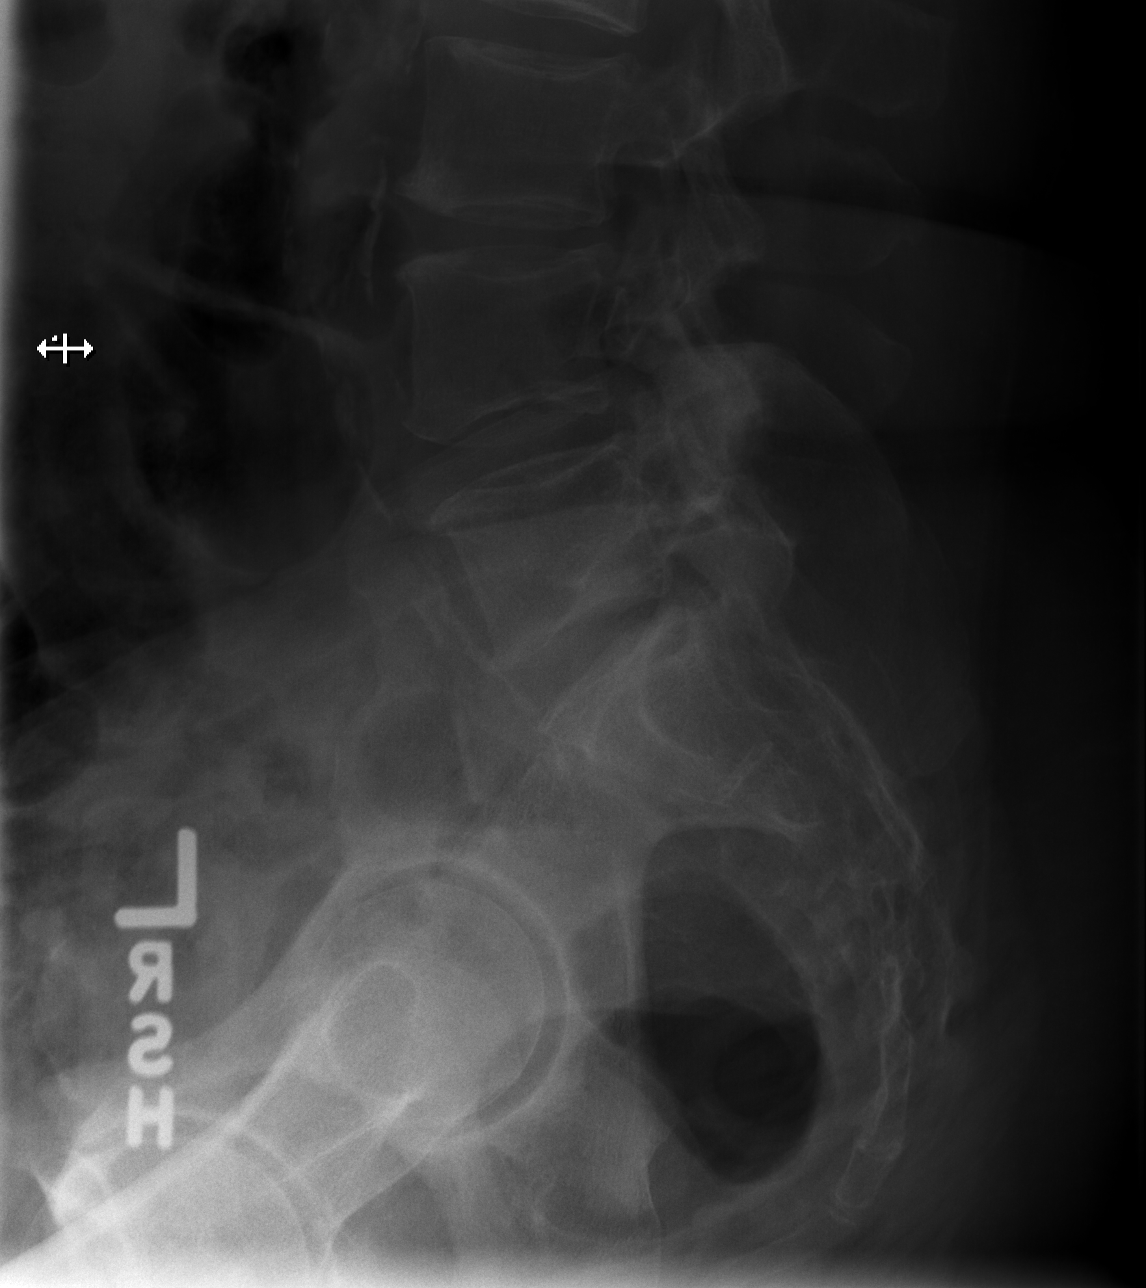

[5 of 5 positions shown; findings below may reference images not displayed]

FINDINGS: No fracture or malalignment. Mild anterior endplate spurring at
multiple levels is unchanged. There is facet degenerative disease at
L4-5 and L5-S1. Paraspinous structures demonstrate atherosclerosis.
IMPRESSION: No acute abnormality.

Mild degenerative disease.

## 2020-08-05 IMAGING — DX DG HIP (WITH OR WITHOUT PELVIS) 2-3V*R*
3 series · 3 of 3 positions shown · non-contrast
Comparison: Plain films the right hip 05/15/2019.

CLINICAL DATA: Low back pain radiating into the right groin, hip
and leg since 06/08/2019.

EXAM:
DG HIP (WITH OR WITHOUT PELVIS) 2-3V RIGHT

[t pelvis ap]
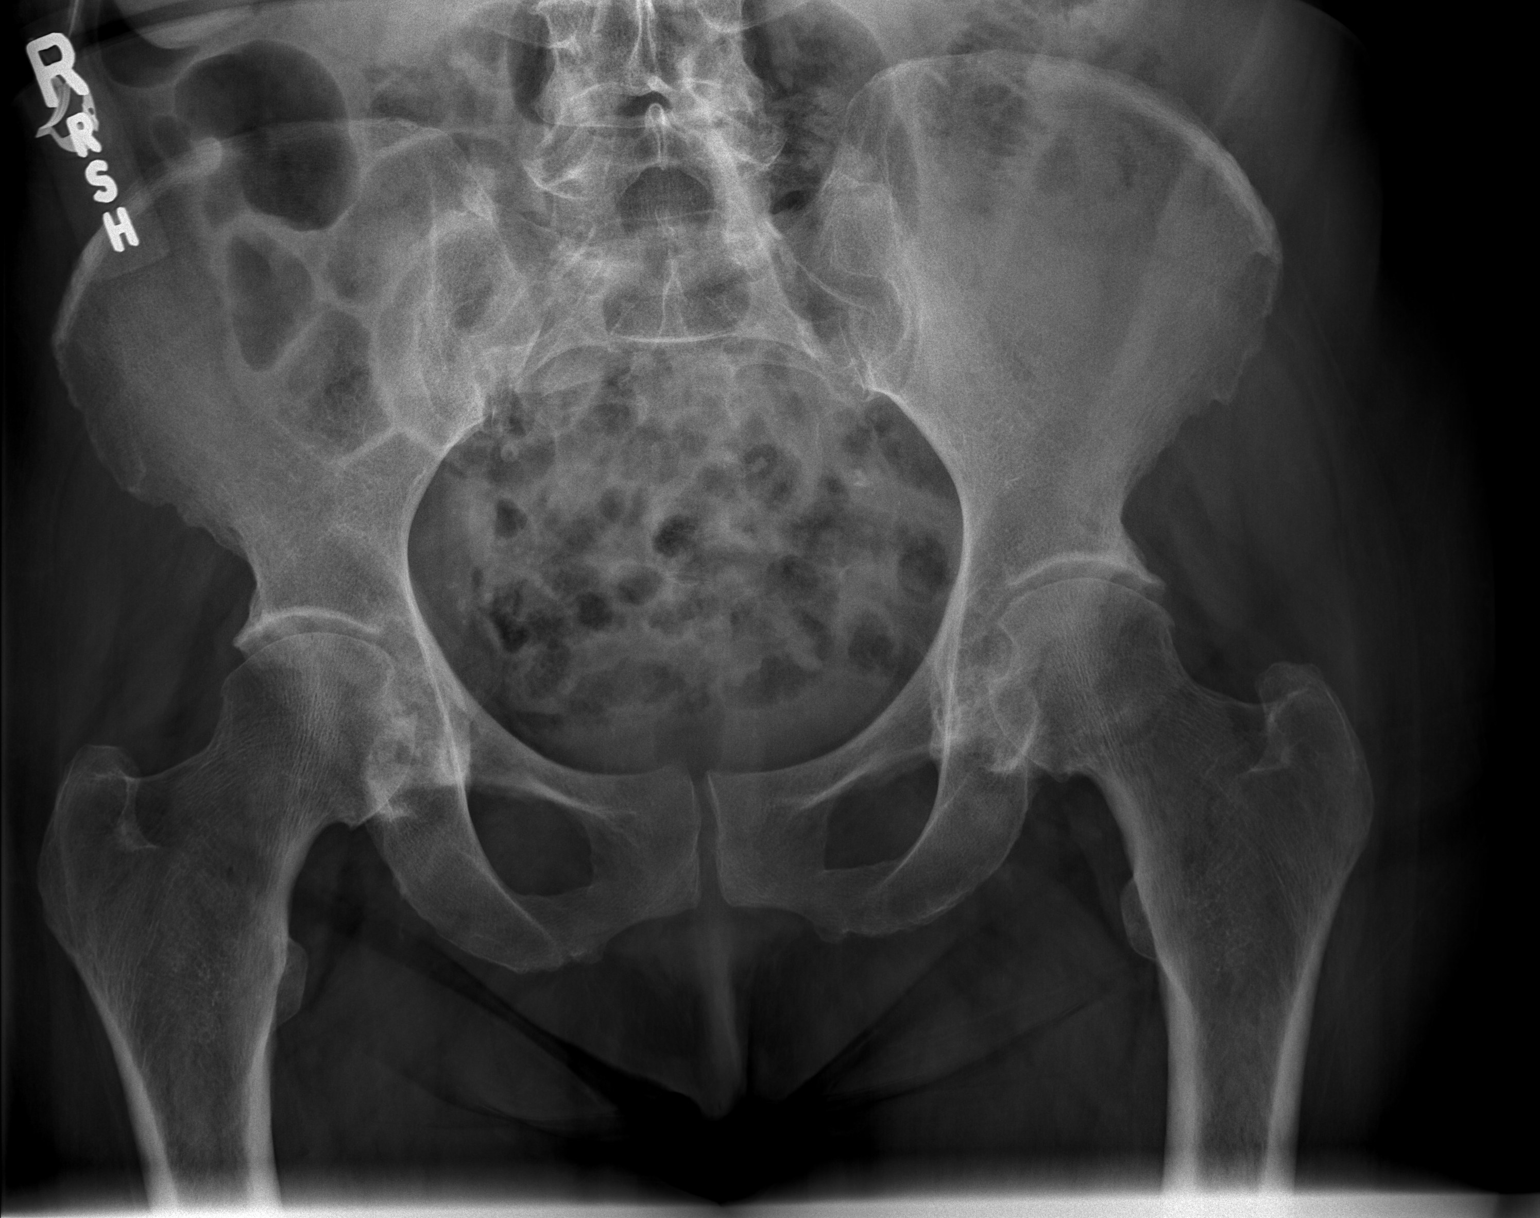

[t hip ap right]
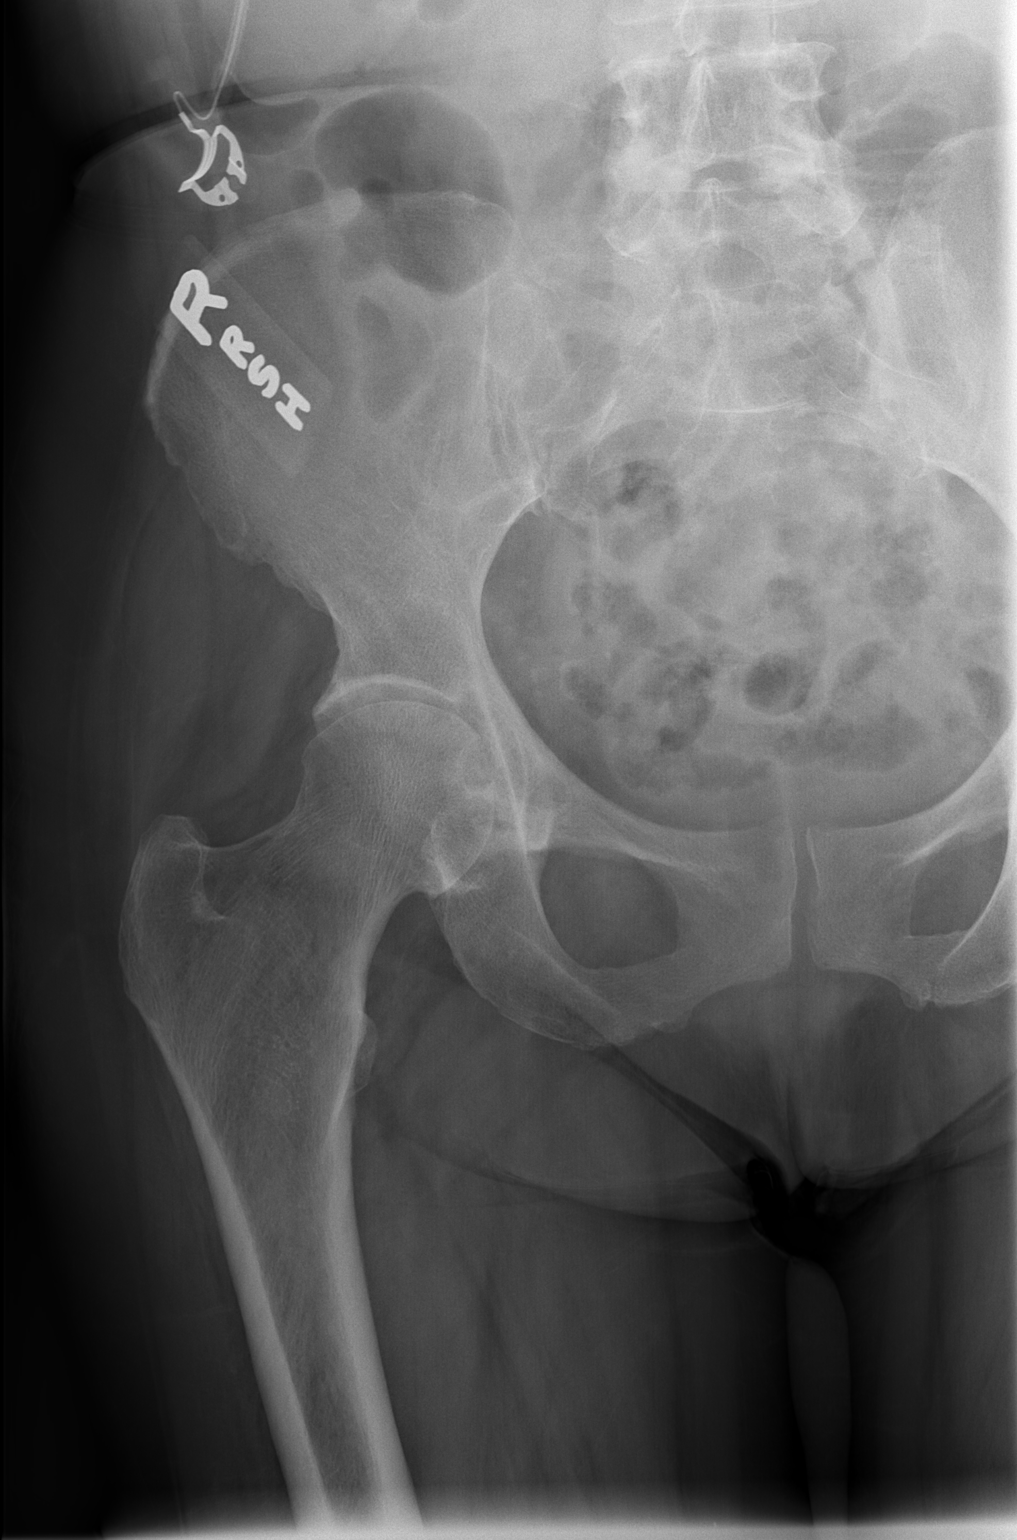

[t hip frog leg right]
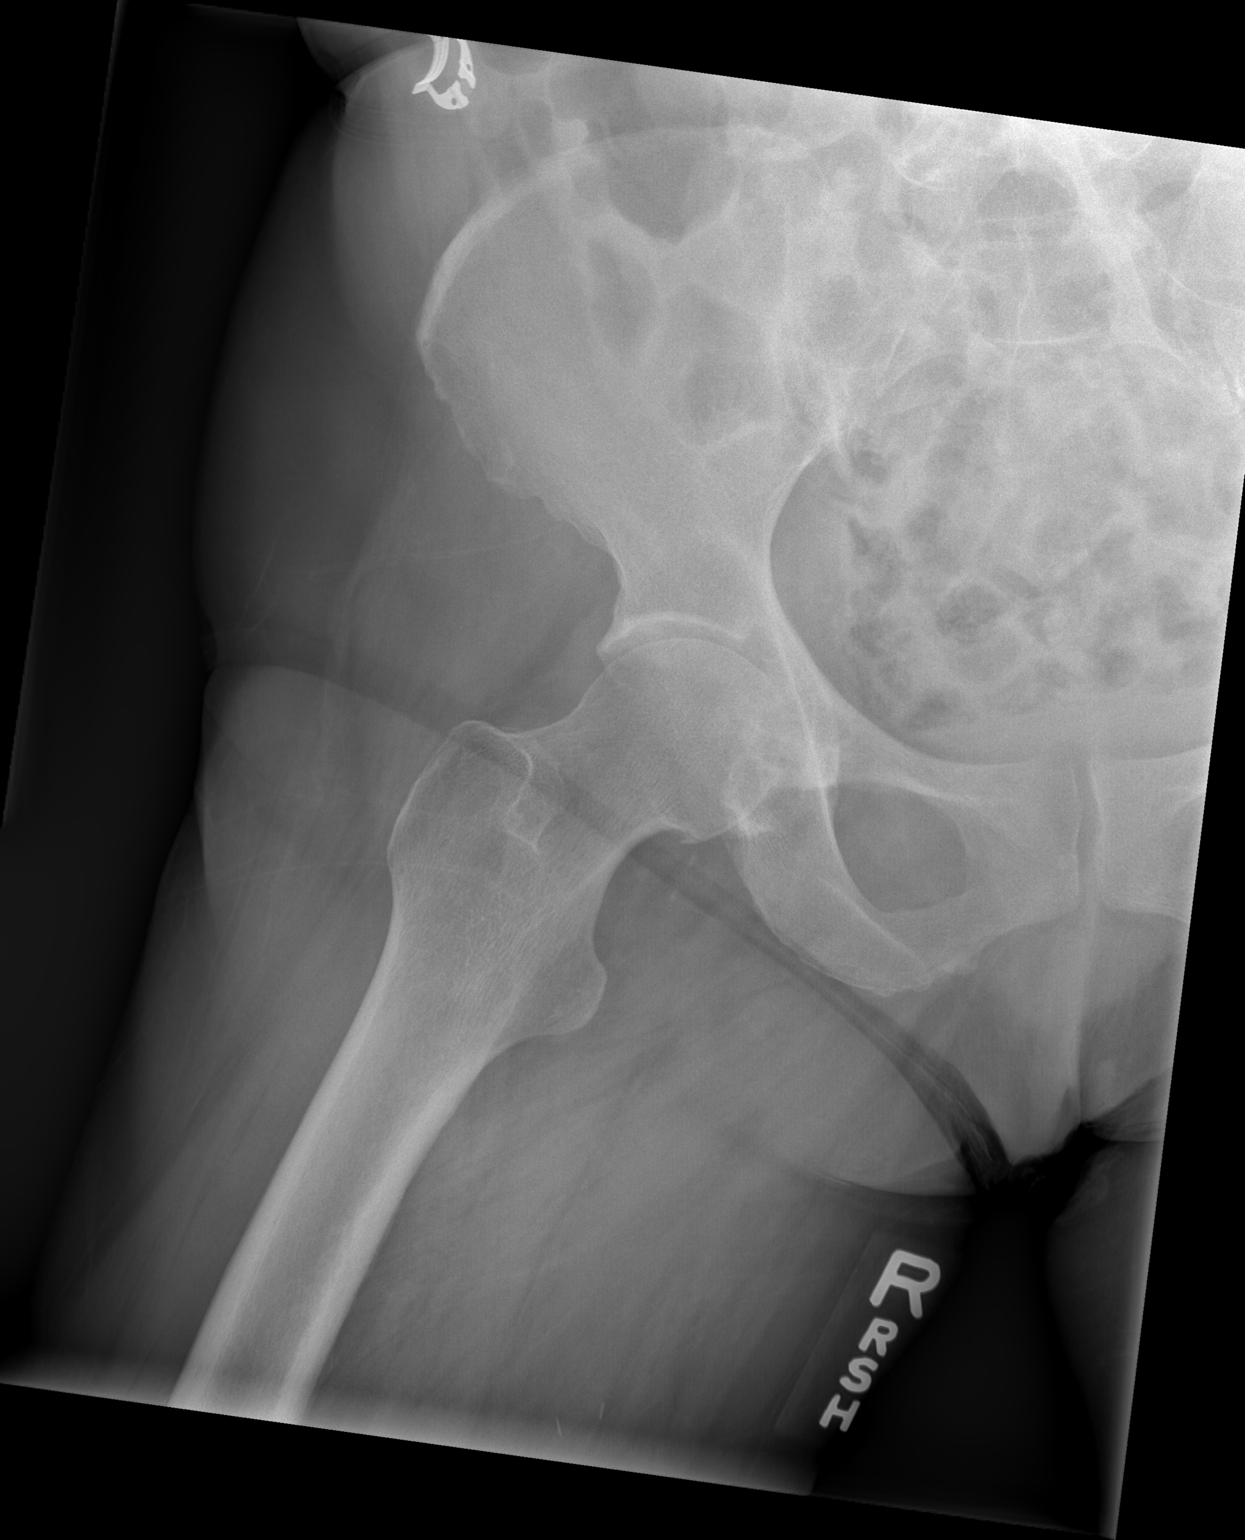

[3 of 3 positions shown; findings below may reference images not displayed]

FINDINGS: There is no acute bony or joint abnormality. Mild bilateral hip
joint space narrowing, osteophytosis and subchondral sclerosis are
symmetric from right to left and unchanged. No lytic or sclerotic
lesion. Soft tissues are unremarkable.
IMPRESSION: No change in mild bilateral hip osteoarthritis. No acute
abnormality.

## 2020-08-07 ENCOUNTER — Other Ambulatory Visit: Payer: Self-pay | Admitting: Orthopedic Surgery

## 2020-08-07 DIAGNOSIS — M25431 Effusion, right wrist: Secondary | ICD-10-CM

## 2020-08-09 ENCOUNTER — Other Ambulatory Visit: Payer: Self-pay | Admitting: Internal Medicine

## 2020-08-09 DIAGNOSIS — R0602 Shortness of breath: Secondary | ICD-10-CM

## 2020-08-14 ENCOUNTER — Other Ambulatory Visit: Payer: Self-pay | Admitting: Internal Medicine

## 2020-08-16 ENCOUNTER — Other Ambulatory Visit: Payer: Self-pay

## 2020-08-16 MED ORDER — HYDROCODONE-ACETAMINOPHEN 5-325 MG PO TABS
1.0000 | ORAL_TABLET | ORAL | 0 refills | Status: DC | PRN
Start: 2020-08-16 — End: 2020-09-13

## 2020-08-19 ENCOUNTER — Ambulatory Visit: Payer: Medicare Other | Admitting: *Deleted

## 2020-08-19 DIAGNOSIS — J449 Chronic obstructive pulmonary disease, unspecified: Secondary | ICD-10-CM

## 2020-08-19 DIAGNOSIS — M130 Polyarthritis, unspecified: Secondary | ICD-10-CM

## 2020-08-19 DIAGNOSIS — I10 Essential (primary) hypertension: Secondary | ICD-10-CM

## 2020-08-19 DIAGNOSIS — I5042 Chronic combined systolic (congestive) and diastolic (congestive) heart failure: Secondary | ICD-10-CM

## 2020-08-19 DIAGNOSIS — E785 Hyperlipidemia, unspecified: Secondary | ICD-10-CM

## 2020-08-19 NOTE — Patient Instructions (Signed)
Visit Information It was nice speaking with you today. PATIENT GOALS: Patient Care Plan: Chronic Pain (Adult)    Problem Identified: Chronic Pain Management (Chronic Pain)     Goal: Chronic Pain Managed- acute on chronic right wrist pain is managed   Start Date: 06/20/2020  Expected End Date: 12/19/2020  This Visit's Progress: On track  Recent Progress: On track  Priority: High  Note:   CARE PLAN ENTRY (see longitudinal plan of care for additional care plan information)  Current Barriers:  [name] Knowledge Deficits related to managing acute/chronic pain- patient states she saw Dr Fredna Dow- hand specialist on 2/29 and will have CT of wrist on 3/3/ then follow up with [facility] on 3/7, say pain is still quite bothersome, says ultrasound of her wrist done 07/03/20 showed arthritis so Voltaren Gel was prescribed by Dr Marianna Payment but it is not really helping, she says she is wearing the wrist brace and taking her medications as prescribed. She says her back pain is about the same and continues to keep her appointments with pain management MD.  Chronic Disease Management support and education needs related to chronic pain  Nurse Case Manager Clinical Goal(s):  [name] Over the next 30-60 days, patient will verbalize understanding of plan for managing pain . Over the next 30-60  days, patient will attend all scheduled medical appointments:   Interventions:  . Evaluation of current treatment plan related to right wrist pain and patient's adherence to plan as established by provider. . Advised patient to keep all scheduled appointments including upcoming imaging and hand specialist appointments . Discussed plans with patient for ongoing care management follow up and provided patient with direct contact information for care management team  Patient Self Care Activities:  . Patient verbalizes understanding of plan to work with providers in treating right wrist pain . Self administers medications as prescribed . Attends  all scheduled provider appointments . Calls pharmacy for medication refills . Calls provider office for new concerns or questions .  keep all provider appointments  - use distraction techniques - use relaxation during pain   -wear right wrist brace - contact provider for unrelieved severe pain Follow up Plan: The care management team will reach out to the patient again over the next 30-60 days.    Patient Care Plan: COPD (Adult)    Problem Identified: Symptom Exacerbation (COPD)- call provider when symptoms not improving despite   Priority: High    Long-Range Goal: Symptom Exacerbation Prevented or Minimized to prevent hosptailzatio or need for emergency treatment   Start Date: 06/20/2020  Expected End Date: 12/19/2020  This Visit's Progress: On track  Recent Progress: On track  Priority: High  Note:   Current Barriers:  [name] Knowledge deficits related to basic COPD self care/management- spoke with patient to complete follow up assessment via phone,  she says she is getting over a very bad cold and fortunately tested negative for Covid, she is asking about a portable nebulizer (one that will plug into a care cigarette lighter) as she says she travels often, says she will ask her daughter to check her INR with the home INR machine tonight and daughter will text results to Dr Johney Maine- director of [facility], she says she continues to receive monthly visits from [facility] as part of their primary care home based program    Case Manager Clinical Goal(s):  Over the next 30-60 days, patient will not be hospitalized for COPD exacerbation  Interventions:   Advised  patient to self assesses COPD action plan zone and make appointment with provider if in the yellow zone for 48 hours without improvement.  Provided education about and advised patient to utilize infection prevention strategies to reduce risk of respiratory infection   Verified with patient that she  continues to receive monthly visits from [facility] most recent [facility] home visit note of 07/25/20- patient's BP meeting treatment targets and no c/o asthma or COPD flare requiring ED visit or hospitalization  Will send community message to [employer] representatives about patient's request for portable nebulizer - Skeet Latch messaged this CCM RN and states [employer] does not provide portable nebulizer  Called patient and told her [employer] does not provide portable nebulizer so patient requests clinic provider send Rx to [facility] on [address] clinic provider requesting Rx for portable nebulizer sent to [facility] on [address] Patient Goals/Self-Care Activities:  . eliminate symptom triggers at home . - follow rescue plan if symptoms flare-up . - keep follow-up appointments . - use an extra pillow to sleep if needed Follow Up Plan: The care management team will reach out to the patient again over the next 30-60 days.       The patient verbalized understanding of instructions, educational materials, and care plan provided today and declined offer to receive copy of patient instructions, educational materials, and care plan.   The care management team will reach out to the patient again over the next 30-60 days.   Kelli Churn RN, CCM, [facility] RN Care Manager [phone]

## 2020-08-19 NOTE — Chronic Care Management (AMB) (Signed)
Chronic Care Management   CCM RN Visit Note  08/19/2020 Name: Kim Anthony [name]: [id number] DOB: 1950-11-17  Subjective: Kim Anthony is a 70 y.o. year old female who is a primary care patient of Marianna Payment, MD. The care management team was consulted for assistance with disease management and care coordination needs.    Engaged with patient by telephone for follow up visit in response to provider referral for case management and/or care coordination services.   Consent to Services:  The patient was given information about Chronic Care Management services, agreed to services, and gave verbal consent prior to initiation of services.  Please see initial visit note for detailed documentation.   Patient agreed to services and verbal consent obtained.   Assessment: Review of patient past medical history, allergies, medications, health status, including review of consultants reports, laboratory and other test data, was performed as part of comprehensive evaluation and provision of chronic care management services.   SDOH (Social Determinants of Health) assessments and interventions performed:    CCM Care Plan  Allergies  Allergen Reactions  . Acetaminophen-Codeine Itching  . Propoxyphene Itching    Darvocet  . Tape Rash    Plastic  . Tramadol Itching and Nausea And Vomiting    Outpatient Encounter Medications as of 08/19/2020  Medication Sig Note  . albuterol (PROVENTIL) (2.5 MG/3ML) 0.083% nebulizer solution USE 1 VIAL IN NEBULIZER EVERY 6 HOURS AS NEEDED FOR WHEEZING FOR SHORTNESS OF BREATH   . albuterol (VENTOLIN HFA) 108 (90 Base) MCG/ACT inhaler Inhale 1-2 puffs into the lungs every 6 (six) hours as needed for wheezing or shortness of breath. 11/28/2019: Uses twice daily  . Calcium-Phosphorus-Vitamin D (CITRACAL +D3 PO) Take 1 tablet by mouth 2 (two) times a day.   . diclofenac Sodium (VOLTAREN) 1 % GEL Apply 4 g topically 4 (four) times daily.   . fluticasone (FLONASE) 50 MCG/ACT  nasal spray Place 2 sprays into both nostrils daily. (Patient taking differently: Place 1 spray into both nostrils daily as needed for allergies.) 04/18/2020: Requesting refill  . furosemide (LASIX) 40 MG tablet Take 1 tablet by mouth twice daily   . gabapentin (NEURONTIN) 600 MG tablet Take 1 tablet (600 mg total) by mouth 3 (three) times daily. She was supposed to wean down on gabapentin- since was on Lyrica- wean to 600 mg 3x/day then reduce dose by 300 mg/week to off, if possible-   . HYDROcodone-acetaminophen (NORCO) 5-325 MG tablet Take 1 tablet by mouth every 4 (four) hours as needed for severe pain (increased to 5x/day as needed).   [name] lisinopril (ZESTRIL) 20 MG tablet Take 1 tablet by mouth once daily   . metoprolol succinate (TOPROL-XL) 25 MG 24 hr tablet Take 1 tablet (25 mg total) by mouth daily.   . potassium chloride SA (KLOR-CON) 20 MEQ tablet Take 2 tablets (40 mEq total) by mouth daily.   . pregabalin (LYRICA) 100 MG capsule TAKE 1 CAPSULE BY MOUTH THREE TIMES DAILY   . simvastatin (ZOCOR) 20 MG tablet Take 1 tablet by mouth once daily   . Spacer/Aero-Holding Chambers (BREATHERITE COLL SPACER ADULT) MISC 1 applicator by Does not apply route daily.   [name] STIOLTO RESPIMAT 2.5-2.5 MCG/ACT AERS INHALE 2 PUFFS BY MOUTH ONCE DAILY   . warfarin (COUMADIN) 5 MG tablet Take one (1) tablet daily--EXCEPT on Wednesdays and Fridays, take only one-half (1/2) tablet on these days.    No facility-administered encounter medications on file as of 08/19/2020.    Patient Active Problem  List   Diagnosis Date Noted  . Right wrist pain 05/29/2020  . Ganglion of right wrist 05/29/2020  . Eye redness 02/15/2020  . Shortness of breath 02/09/2020  . Chronic pain syndrome 01/17/2020  . Monoarthritis of wrist 10/18/2019  . Ganglion cyst 09/21/2019  . Hip pain 08/23/2019  . Thoracic ascending aortic aneurysm ([place]) 07/05/2019  . Pustular rash 03/09/2019  . Anemia 02/16/2019  . Breast cancer ([place])   .  Acquired absence of left breast 12/20/2018  . Open wound of axillary region 05/11/2018  . COPD (chronic obstructive pulmonary disease) ([place]) 11/25/2017  . S/P MVR (mitral valve replacement)   . Chronic anticoagulation   . Tobacco use disorder 02/25/2017  . Lymphadenopathy, mediastinal 02/25/2017  . History of mitral valve replacement with mechanical valve 11/18/2016  . Essential hypertension 02/01/2016  . Allergic rhinitis 02/01/2016  . Healthcare maintenance 01/30/2016  . Chronic low back pain 07/22/2015  . Pulmonary embolism ([place]) 07/11/2015  . Chronic combined systolic and diastolic congestive heart failure ([place]) 07/11/2015  . Headache, common migraine 07/11/2015  . HLD (hyperlipidemia) 07/11/2015    Conditions to be addressed/monitored:COPD, Asthma, HF, HTN, HLD, on Coumadin r/t MVR, chronic back pain and right wrist pain  Care Plan : Chronic Pain (Adult)  Updates made by Barrington Ellison, RN since 08/19/2020 12:00 AM    Problem: Chronic Pain Management (Chronic Pain)     Goal: Chronic Pain Managed- acute on chronic right wrist pain is managed   Start Date: 06/20/2020  Expected End Date: 12/19/2020  This Visit's Progress: On track  Recent Progress: On track  Priority: High  Note:   CARE PLAN ENTRY (see longitudinal plan of care for additional care plan information)  Current Barriers:  [name] Knowledge Deficits related to managing acute/chronic pain- patient states she saw Dr Fredna Dow- hand specialist on 2/29 and will have CT of wrist on 3/3/ then follow up with [facility] on 3/7, say pain is still quite bothersome, says ultrasound of her wrist done 07/03/20 showed arthritis so Voltaren Gel was prescribed by Dr Marianna Payment but it is not really helping, she says she is wearing the wrist brace and taking her medications as prescribed. She says her back pain is about the same and continues to keep her appointments with pain management MD.  Chronic Disease Management support and education needs related to  chronic pain  Nurse Case Manager Clinical Goal(s):  [name] Over the next 30-60 days, patient will verbalize understanding of plan for managing pain . Over the next 30-60  days, patient will attend all scheduled medical appointments:   Interventions:  . Evaluation of current treatment plan related to right wrist pain and patient's adherence to plan as established by provider. . Advised patient to keep all scheduled appointments including upcoming imaging and hand specialist appointments . Discussed plans with patient for ongoing care management follow up and provided patient with direct contact information for care management team  Patient Self Care Activities:  . Patient verbalizes understanding of plan to work with providers in treating right wrist pain . Self administers medications as prescribed . Attends all scheduled provider appointments . Calls pharmacy for medication refills . Calls provider office for new concerns or questions .  keep all provider appointments  - use distraction techniques - use relaxation during pain   -wear right wrist brace - contact provider for unrelieved severe pain Follow up Plan: The care management team will reach out to the patient again over the next 30-60 days.  Care Plan : COPD (Adult)  Updates made by Barrington Ellison, RN since 08/19/2020 12:00 AM    Problem: Symptom Exacerbation (COPD)- call provider when symptoms not improving despite   Priority: High    Long-Range Goal: Symptom Exacerbation Prevented or Minimized to prevent hospitalization or need for emergency treatment   Start Date: 06/20/2020  Expected End Date: 12/19/2020  This Visit's Progress: On track  Recent Progress: On track  Priority: High  Note:   Current Barriers:  [name] Knowledge deficits related to basic COPD self care/management- spoke with patient to complete follow up assessment via phone,  she says she is getting over a very bad cold and fortunately tested negative for Covid,  she is asking about a portable nebulizer (one that will plug into a care cigarette lighter) as she says she travels often, says she will ask her daughter to check her INR with the home INR machine tonight and daughter will text results to Dr Johney Maine- director of the [facility], she says she continues to receive monthly visits from [place] [facility] as part of their primary care home based program    Case Manager Clinical Goal(s):  Over the next 30-60 days, patient will not be hospitalized for COPD exacerbation  Interventions:   Advised patient to self assesses COPD action plan zone and make appointment with provider if in the yellow zone for 48 hours without improvement.  Provided education about and advised patient to utilize infection prevention strategies to reduce risk of respiratory infection   Verified with patient that she continues to receive monthly visits from [place] most recent [facility] home visit note of 07/25/20- patient's BP meeting treatment targets and no c/o asthma or COPD flare requiring ED visit or hospitalization  Will send community message to [employer] representatives about patient's request for portable nebulizer - Skeet Latch messaged this CCM RN and states [employer] does not provide portable nebulizer  Called patient and told her [employer] does not provide portable nebulizer so patient requests clinic provider send Rx to [facility] on [address] clinic provider requesting Rx for portable nebulizer sent to [facility] on [address] Patient Goals/Self-Care Activities:  . eliminate symptom triggers at home . - follow rescue plan if symptoms flare-up . - keep follow-up appointments . - use an extra pillow to sleep if needed Follow Up Plan: The care management team will reach out to the patient again over the next 30-60 days.       Kelli Churn RN, CCM, [facility] RN Care  Manager [phone]

## 2020-08-19 NOTE — Progress Notes (Signed)
Internal Medicine Clinic Resident  I have personally reviewed this encounter including the documentation in this note and/or discussed this patient with the care management provider. I will address any urgent items identified by the care management provider and will communicate my actions to the patient's PCP. I have reviewed the patient's CCM visit with my supervising attending, Dr Jimmye Norman .  [name], MD 08/19/2020

## 2020-08-19 NOTE — Addendum Note (Signed)
Addended by: Riesa Pope on: 08/19/2020 06:22 PM   Modules accepted: Orders

## 2020-08-19 NOTE — Progress Notes (Signed)
CCM encounter is reviewed with Dr. Johnney Ou and I approve of the plan as documented.

## 2020-08-20 ENCOUNTER — Other Ambulatory Visit: Payer: Self-pay | Admitting: Internal Medicine

## 2020-08-20 DIAGNOSIS — Z1231 Encounter for screening mammogram for malignant neoplasm of breast: Secondary | ICD-10-CM

## 2020-08-22 ENCOUNTER — Ambulatory Visit
Admission: RE | Admit: 2020-08-22 | Discharge: 2020-08-22 | Disposition: A | Discharge status: Home / Self Care | Payer: Medicare Other | Source: Ambulatory Visit | Attending: Orthopedic Surgery | Admitting: Orthopedic Surgery

## 2020-08-22 ENCOUNTER — Other Ambulatory Visit: Payer: Self-pay

## 2020-08-22 DIAGNOSIS — M25431 Effusion, right wrist: Secondary | ICD-10-CM

## 2020-08-22 MED ORDER — IOPAMIDOL (ISOVUE-M 200) INJECTION 41%: 3.0000 mL | Freq: Once | INTRAMUSCULAR | Status: DC

## 2020-08-23 ENCOUNTER — Other Ambulatory Visit: Payer: Self-pay | Admitting: Internal Medicine

## 2020-08-23 ENCOUNTER — Other Ambulatory Visit: Payer: Self-pay | Admitting: Pharmacist

## 2020-08-23 DIAGNOSIS — I10 Essential (primary) hypertension: Secondary | ICD-10-CM

## 2020-08-23 DIAGNOSIS — I5042 Chronic combined systolic (congestive) and diastolic (congestive) heart failure: Secondary | ICD-10-CM

## 2020-08-25 ENCOUNTER — Other Ambulatory Visit: Payer: Self-pay | Admitting: Internal Medicine

## 2020-08-25 DIAGNOSIS — J449 Chronic obstructive pulmonary disease, unspecified: Secondary | ICD-10-CM

## 2020-08-26 ENCOUNTER — Ambulatory Visit (INDEPENDENT_AMBULATORY_CARE_PROVIDER_SITE_OTHER): Payer: Medicare Other | Admitting: Pharmacist

## 2020-08-26 ENCOUNTER — Other Ambulatory Visit: Payer: Self-pay

## 2020-08-26 ENCOUNTER — Encounter: Payer: Self-pay | Admitting: Internal Medicine

## 2020-08-26 ENCOUNTER — Ambulatory Visit (INDEPENDENT_AMBULATORY_CARE_PROVIDER_SITE_OTHER): Payer: Medicare Other | Admitting: Internal Medicine

## 2020-08-26 VITALS — BP 126/76 | HR 70 | Temp 98.7°F | Wt 181.7 lb

## 2020-08-26 DIAGNOSIS — Z7901 Long term (current) use of anticoagulants: Secondary | ICD-10-CM | POA: Diagnosis not present

## 2020-08-26 DIAGNOSIS — I2699 Other pulmonary embolism without acute cor pulmonale: Secondary | ICD-10-CM

## 2020-08-26 DIAGNOSIS — R0602 Shortness of breath: Secondary | ICD-10-CM | POA: Diagnosis not present

## 2020-08-26 DIAGNOSIS — I10 Essential (primary) hypertension: Secondary | ICD-10-CM

## 2020-08-26 DIAGNOSIS — J449 Chronic obstructive pulmonary disease, unspecified: Secondary | ICD-10-CM | POA: Diagnosis not present

## 2020-08-26 DIAGNOSIS — Z952 Presence of prosthetic heart valve: Secondary | ICD-10-CM

## 2020-08-26 DIAGNOSIS — T7840XA Allergy, unspecified, initial encounter: Secondary | ICD-10-CM

## 2020-08-26 DIAGNOSIS — E876 Hypokalemia: Secondary | ICD-10-CM

## 2020-08-26 DIAGNOSIS — I5042 Chronic combined systolic (congestive) and diastolic (congestive) heart failure: Secondary | ICD-10-CM

## 2020-08-26 LAB — BRAIN NATRIURETIC PEPTIDE: B Natriuretic Peptide: 277.3 pg/mL — ABNORMAL HIGH (ref 0.0–100.0)

## 2020-08-26 LAB — POCT INR: INR: 2.6 (ref 2.0–3.0)

## 2020-08-26 MED ORDER — CETIRIZINE HCL 10 MG PO TABS: 10.0000 mg | ORAL_TABLET | Freq: Every day | ORAL | 2 refills | Status: DC

## 2020-08-26 MED ORDER — FLUTICASONE PROPIONATE 50 MCG/ACT NA SUSP: 1.0000 | Freq: Every day | NASAL | 11 refills | Status: DC | PRN

## 2020-08-26 MED ORDER — ALBUTEROL SULFATE (2.5 MG/3ML) 0.083% IN NEBU: INHALATION_SOLUTION | RESPIRATORY_TRACT | 0 refills | Status: DC

## 2020-08-26 MED ORDER — FUROSEMIDE 40 MG PO TABS: 40.0000 mg | ORAL_TABLET | Freq: Two times a day (BID) | ORAL | 0 refills | Status: DC

## 2020-08-26 MED ORDER — POTASSIUM CHLORIDE CRYS ER 20 MEQ PO TBCR: 40.0000 meq | EXTENDED_RELEASE_TABLET | Freq: Every day | ORAL | 0 refills | Status: DC

## 2020-08-26 MED ORDER — ALBUTEROL SULFATE HFA 108 (90 BASE) MCG/ACT IN AERS: 1.0000 | INHALATION_SPRAY | Freq: Four times a day (QID) | RESPIRATORY_TRACT | 11 refills | Status: DC | PRN

## 2020-08-26 MED ORDER — STIOLTO RESPIMAT 2.5-2.5 MCG/ACT IN AERS
INHALATION_SPRAY | RESPIRATORY_TRACT | 0 refills | Status: DC
Start: 2020-08-26 — End: 2020-10-03

## 2020-08-26 NOTE — Progress Notes (Signed)
   CC: wheezing   HPI:  [name] is a 70 y.o. with PMH as below.   Please see A&P for assessment of the patient's acute and chronic medical conditions.   She had a very severe cold two weeks ago which resolved and then this past week she feels like she started wheezing. Covid was negative at that time. She has had all three covid vaccines. No fever, chills. Mild SOB with exertion. Cough with clear mucous. Endorses chronic sinus pressure and nasal congestion. No chest pain or LE swelling. Her nebulizer does help. She smokes about 3-4 cigarettes per day.  On physical exam lungs are completely clear. She has a history of PE and is on warfarin although hasn't had INR checked since September of last year when it was 1.4. Goal is 2.5-3.5 with history of mitral valve. She has no LE edema but does appear to have some JVD. Weight it stable.   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 She would like to hold off on pulmonology referral at this time.   Past Medical History:  Diagnosis Date  . Arthritis   . Asthma   . Breast cancer ([place]) 2001   Left Breast Cancer  . CHF (congestive heart failure) ([place])   . COPD (chronic obstructive pulmonary disease) ([place])   . Coronary artery disease   . History of mitral valve replacement with mechanical valve   . HLD (hyperlipidemia)   . Hypertension   . Pulmonary embolism (HCC)    Review of Systems:   10 point ROS negative except as noted in HPI  Physical Exam:   Constitution: NAD, appears stated age HENT: [place]/AT  Cardio: RRR, S1 click, S2 normal, no other m/r/g, no LE edema  Respiratory: lungs clear to auscultation, no wheezing rales or rhonchi  Abdominal: NTTP, soft, non-distended MSK: moving all extremities Neuro: normal affect, a&ox3 Skin: c/d/i    Vitals:   08/26/20 1359  BP: (!)  157/79  Pulse: 70  Temp: 98.7 F (37.1 C)  TempSrc: Oral  SpO2: 93%  Weight: 181 lb 11.2 oz (82.4 kg)     Assessment & Plan:   See Encounters Tab for problem based charting.  Patient discussed with Dr. Angelia Mould

## 2020-08-26 NOTE — Assessment & Plan Note (Addendum)
She had a very severe cold two weeks ago which resolved and then this past week she feels like she started wheezing. Covid was negative at that time. She has had all three covid vaccines. No fever, chills. Mild SOB with exertion. Cough with clear mucous. Endorses chronic sinus pressure and nasal congestion. No chest pain or LE swelling. Her nebulizer does help. She smokes about 3-4 cigarettes per day.  On physical exam lungs are completely clear. She has a history of PE and is on warfarin although hasn't had INR checked since September of last year when it was 1.4. Goal is 2.5-3.5 with history of mitral valve. She has no LE edema but does appear to have some JVD. Weight it stable.   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 She would like to hold off on pulmonology referral at this time.   - INR checked with Dr. Elie Confer - 2.6 today  - She has not been taking stiolto and does not remember ever starting this, will reorder  - f/u with Dr. Elie Confer for INR check  - start cetirizine 10 mg  - f/u if symptoms do not improve  - check BNP, CBC  - discussed smoking cessation   ADDENDUM: BNP significantly elevated compared to prior. Discussed results with her. She has not noticed weight gain or increased shortness of breath since our clinic appointment. Will order echo. She will call clinic if she begins to put on fluid or has increasing SOB.

## 2020-08-26 NOTE — Patient Instructions (Addendum)
Thank you for allowing [place] to provide your care today. Today we discussed your wheezing.   I have ordered the following labs for you:  Basic metabolic panel, BNP    I will call if any are abnormal.    Today we made the following changes to your medications:   Please follow-up with Dr. Gladstone Pih office to keep track of your INR.   Please START taking  Cetirizine 10 mg - take one tablet daily for allergies  Stiolto - inhale two puffs one time per day  Please STOP taking   Please follow-up in six months for blood pressure check.    Please call the internal medicine center clinic if you have any questions or concerns, we may be able to help and keep you from a long and expensive emergency room wait. Our clinic and after hours phone number is [phone], the best time to call is Monday through Friday 9 am to 4 pm but there is always someone available 24/7 if you have an emergency. If you need medication refills please notify your pharmacy one week in advance and they will send [place] a request.

## 2020-08-26 NOTE — Assessment & Plan Note (Signed)
BP Readings from Last 3 Encounters:  08/26/20 (!) 157/79  06/11/20 134/81  06/03/20 (!) 161/84   Recheck 127/76. She is on lisinopril 20 mg qd, toprol 25 mg qd, lasix 40 mg bid, and KDur 20 mEq.   - cont. Current medications - BNP today

## 2020-08-26 NOTE — Assessment & Plan Note (Signed)
She has been taking her warfarin. Previously INR was checked at home by her daughter and then sometimes she would come into clinic. Last INR I can see was 1.6 in September. Goal is 2.5-3.5 with mechanical mitral valve. Checked today and 2.6.   - f/u with Dr. Elie Confer in four weeks

## 2020-08-26 NOTE — Progress Notes (Signed)
Anticoagulation Management Kim Anthony is a 70 y.o. female who reports to the clinic for monitoring of warfarin treatment.    Indication: PE , History of; Long term current use of oral anticoagulation warfarin to maintain INR 2.5 - 3.5 as established at presentation of her index PE.  Duration: indefinite Supervising physician: Aldine Contes  Anticoagulation Clinic Visit History: Patient does not report signs/symptoms of bleeding or thromboembolism  Other recent changes: No diet, medications, lifestyle changes except as noted in patient findings.  Anticoagulation Episode Summary    Current INR goal:  2.5-3.5  TTR:  27.7 % (4.6 y)  Next INR check:  09/23/2020  INR from last check:  2.6 (08/26/2020)  Weekly max warfarin dose:    Target end date:    INR check location:  Anticoagulation Clinic  Preferred lab:    Send INR reminders to:     Indications   Pulmonary embolism (HCC) [I26.99]       Comments:          Allergies  Allergen Reactions   Acetaminophen-Codeine Itching   Propoxyphene Itching    Darvocet   Tape Rash    Plastic   Tramadol Itching and Nausea And Vomiting    Current Outpatient Medications:    albuterol (PROVENTIL) (2.5 MG/3ML) 0.083% nebulizer solution, USE 1 VIAL IN NEBULIZER EVERY 6 HOURS AS NEEDED FOR WHEEZING FOR SHORTNESS OF BREATH, Disp: 90 mL, Rfl: 0   albuterol (VENTOLIN HFA) 108 (90 Base) MCG/ACT inhaler, Inhale 1-2 puffs into the lungs every 6 (six) hours as needed for wheezing or shortness of breath., Disp: 6.7 g, Rfl: 11   Calcium-Phosphorus-Vitamin D (CITRACAL +D3 PO), Take 1 tablet by mouth 2 (two) times a day., Disp: , Rfl:    diclofenac Sodium (VOLTAREN) 1 % GEL, Apply 4 g topically 4 (four) times daily., Disp: 480 g, Rfl: 2   fluticasone (FLONASE) 50 MCG/ACT nasal spray, Place 2 sprays into both nostrils daily. (Patient taking differently: Place 1 spray into both nostrils daily as needed for allergies.), Disp: 16 g, Rfl: 2   furosemide  (LASIX) 40 MG tablet, Take 1 tablet by mouth twice daily, Disp: 60 tablet, Rfl: 0   gabapentin (NEURONTIN) 600 MG tablet, Take 1 tablet (600 mg total) by mouth 3 (three) times daily. She was supposed to wean down on gabapentin- since was on Lyrica- wean to 600 mg 3x/day then reduce dose by 300 mg/week to off, if possible-, Disp: 180 tablet, Rfl: 2   HYDROcodone-acetaminophen (NORCO) 5-325 MG tablet, Take 1 tablet by mouth every 4 (four) hours as needed for severe pain (increased to 5x/day as needed)., Disp: 150 tablet, Rfl: 0   lisinopril (ZESTRIL) 20 MG tablet, Take 1 tablet by mouth once daily, Disp: 90 tablet, Rfl: 0   metoprolol succinate (TOPROL-XL) 25 MG 24 hr tablet, Take 1 tablet (25 mg total) by mouth daily., Disp: 90 tablet, Rfl: 3   potassium chloride SA (KLOR-CON) 20 MEQ tablet, Take 2 tablets (40 mEq total) by mouth daily., Disp: 60 tablet, Rfl: 0   pregabalin (LYRICA) 100 MG capsule, TAKE 1 CAPSULE BY MOUTH THREE TIMES DAILY, Disp: 90 capsule, Rfl: 5   simvastatin (ZOCOR) 20 MG tablet, Take 1 tablet by mouth once daily, Disp: 90 tablet, Rfl: 0   Spacer/Aero-Holding Chambers (BREATHERITE COLL SPACER ADULT) MISC, 1 applicator by Does not apply route daily., Disp: 1 each, Rfl: 0   warfarin (COUMADIN) 5 MG tablet, Take one (1) tablet daily--EXCEPT on Wednesdays and Fridays, take only  one-half (1/2) tablet on these days., Disp: 30 tablet, Rfl: 1   STIOLTO RESPIMAT 2.5-2.5 MCG/ACT AERS, INHALE 2 PUFFS BY MOUTH ONCE DAILY (Patient not taking: Reported on 08/26/2020), Disp: 4 g, Rfl: 0 Past Medical History:  Diagnosis Date   Arthritis    Asthma    Breast cancer ([place]) 2001   Left Breast Cancer   CHF (congestive heart failure) (HCC)    COPD (chronic obstructive pulmonary disease) (HCC)    Coronary artery disease    History of mitral valve replacement with mechanical valve    HLD (hyperlipidemia)    Hypertension    Pulmonary embolism (HCC)    Social History    Socioeconomic History   Marital status: Single    Spouse name: Not on file   Number of children: Not on file   Years of education: Not on file   Highest education level: Not on file  Occupational History   Not on file  Tobacco Use   Smoking status: Current Every Day Smoker    Packs/day: 0.25    Years: 30.00    Pack years: 7.50    Types: Cigarettes   Smokeless tobacco: Never Used   Tobacco comment: Sometimes less. 4 per day  Vaping Use   Vaping Use: Never used  Substance and Sexual Activity   Alcohol use: Yes    Comment: beer- ocasional   Drug use: No   Sexual activity: Not Currently    Birth control/protection: None  Other Topics Concern   Not on file  Social History Narrative   ** Merged History Encounter **       Social Determinants of Health   Financial Resource Strain: Low Risk    Difficulty of Paying Living Expenses: Not hard at all  Food Insecurity: No Food Insecurity   Worried About Charity fundraiser in the Last Year: Never true   Ran Out of Food in the Last Year: Never true  Transportation Needs: No Transportation Needs   Lack of Transportation (Medical): No   Lack of Transportation (Non-Medical): No  Physical Activity: Not on file  Stress: Not on file  Social Connections: Socially Isolated   Frequency of Communication with Friends and Family: More than three times a week   Frequency of Social Gatherings with Friends and Family: More than three times a week   Attends Religious Services: Never   Marine scientist or Organizations: No   Attends Music therapist: Not on file   Marital Status: Never married   Family History  Problem Relation Age of Onset   Heart attack Other    Breast cancer Maternal Aunt 50    ASSESSMENT Recent Results: The most recent result is correlated with 30 mg per week: Lab Results  Component Value Date   INR 2.6 08/26/2020   INR 1.4 (A) 02/28/2020   INR 3.6 (H) 02/15/2020     Anticoagulation Dosing:   INR today: Therapeutic  PLAN Weekly dose was increased by 8% to 32.5 mg per week  Patient Instructions  Patient instructed to take medications as defined in the Anti-coagulation Track section of this encounter.  Patient instructed to take today's dose.  Patient instructed to take one of your peach-colored 5mg  strength warfarin tablets daily--EXCEPT on Wednesdays, take only 1/2 of your tablet, each Wednesday.  Patient verbalized understanding of these instructions.    Patient advised to contact clinic or seek medical attention if signs/symptoms of bleeding or thromboembolism occur.  Patient verbalized understanding by  repeating back information and was advised to contact me if further medication-related questions arise. Patient was also provided an information handout.  Follow-up Return in 4 weeks (on 09/23/2020) for Follow up INR.  Pennie Banter, PharmD, CPP  15 minutes spent face-to-face with the patient during the encounter. 50% of time spent on education, including signs/sx bleeding and clotting, as well as food and drug interactions with warfarin. 50% of time was spent on fingerprick POC INR sample collection,processing, results determination, and documentation in [email or web address]

## 2020-08-26 NOTE — Patient Instructions (Signed)
Patient instructed to take medications as defined in the Anti-coagulation Track section of this encounter.  Patient instructed to take today's dose.  Patient instructed to take one of your peach-colored 5mg  strength warfarin tablets daily--EXCEPT on Wednesdays, take only 1/2 of your tablet, each Wednesday.  Patient verbalized understanding of these instructions.

## 2020-08-26 NOTE — Assessment & Plan Note (Signed)
Previously INR was checked at home by her daughter and then sometimes she would come into clinic. Goal is 2.5-3.5 with mechanical mitral valve. Checked today and 2.6.   - f/u with Dr. Elie Confer in four weeks

## 2020-08-27 LAB — BMP8+ANION GAP
Anion Gap: 17 mmol/L (ref 10.0–18.0)
BUN/Creatinine Ratio: 15 (ref 12–28)
BUN: 12 mg/dL (ref 8–27)
CO2: 22 mmol/L (ref 20–29)
Calcium: 9.4 mg/dL (ref 8.7–10.3)
Chloride: 102 mmol/L (ref 96–106)
Creatinine, Ser: 0.8 mg/dL (ref 0.57–1.00)
Glucose: 91 mg/dL (ref 65–99)
Potassium: 3.8 mmol/L (ref 3.5–5.2)
Sodium: 141 mmol/L (ref 134–144)
eGFR: 79 mL/min/{1.73_m2} (ref >59)

## 2020-08-27 LAB — CBC
Hematocrit: 41.2 % (ref 34.0–46.6)
Hemoglobin: 13.7 g/dL (ref 11.1–15.9)
MCH: 31.4 pg (ref 26.6–33.0)
MCHC: 33.3 g/dL (ref 31.5–35.7)
MCV: 94 fL (ref 79–97)
Platelets: 405 10*3/uL (ref 150–450)
RBC: 4.37 x10E6/uL (ref 3.77–5.28)
RDW: 10.7 % — ABNORMAL LOW (ref 11.7–15.4)
WBC: 7.1 10*3/uL (ref 3.4–10.8)

## 2020-08-27 NOTE — Progress Notes (Signed)
Internal Medicine Clinic Attending  Case discussed with Dr. Seawell  At the time of the visit.  We reviewed the resident's history and exam and pertinent patient test results.  I agree with the assessment, diagnosis, and plan of care documented in the resident's note.  

## 2020-08-28 NOTE — Assessment & Plan Note (Signed)
>>  ASSESSMENT AND PLAN FOR BIVENTRICULAR HEART FAILURE WITH REDUCED LEFT VENTRICULAR FUNCTION (HCC) WRITTEN ON 08/28/2020  8:41 AM BY SEAWELL, JAIMIE A, DO  See "COPD"

## 2020-08-28 NOTE — Addendum Note (Signed)
Addended by: Molli Hazard A on: 08/28/2020 08:43 AM   Modules accepted: Orders

## 2020-08-28 NOTE — Assessment & Plan Note (Signed)
>>  ASSESSMENT AND PLAN FOR CHRONIC HFREF (HEART FAILURE WITH REDUCED EJECTION FRACTION) (HCC) WRITTEN ON 09/14/2023 11:43 AM BY Hassel Uphoff, DO   >>ASSESSMENT AND PLAN FOR CHF (CONGESTIVE HEART FAILURE) (HCC) WRITTEN ON 09/14/2023 11:43 AM BY Hetvi Shawhan, DO   >>ASSESSMENT AND PLAN FOR BIVENTRICULAR HEART FAILURE WITH REDUCED LEFT VENTRICULAR FUNCTION (HCC) WRITTEN ON 08/28/2020  8:41 AM BY SEAWELL, JAIMIE A, DO  See "COPD"

## 2020-08-28 NOTE — Assessment & Plan Note (Addendum)
See "COPD"

## 2020-08-28 NOTE — Assessment & Plan Note (Signed)
>>  ASSESSMENT AND PLAN FOR HFREF (HEART FAILURE WITH REDUCED EJECTION FRACTION) (HCC) WRITTEN ON 09/14/2023 11:44 AM BY Tawn Fitzner, DO   >>ASSESSMENT AND PLAN FOR CHRONIC HFREF (HEART FAILURE WITH REDUCED EJECTION FRACTION) (HCC) WRITTEN ON 09/14/2023 11:43 AM BY Janisse Ghan, DO   >>ASSESSMENT AND PLAN FOR CHF (CONGESTIVE HEART FAILURE) (HCC) WRITTEN ON 09/14/2023 11:43 AM BY Zina Pitzer, DO   >>ASSESSMENT AND PLAN FOR BIVENTRICULAR HEART FAILURE WITH REDUCED LEFT VENTRICULAR FUNCTION (HCC) WRITTEN ON 08/28/2020  8:41 AM BY SEAWELL, JAIMIE A, DO  See "COPD"

## 2020-08-28 NOTE — Assessment & Plan Note (Signed)
>>  ASSESSMENT AND PLAN FOR CHF (CONGESTIVE HEART FAILURE) (HCC) WRITTEN ON 09/14/2023 11:43 AM BY Daviyon Widmayer, DO   >>ASSESSMENT AND PLAN FOR BIVENTRICULAR HEART FAILURE WITH REDUCED LEFT VENTRICULAR FUNCTION (HCC) WRITTEN ON 08/28/2020  8:41 AM BY SEAWELL, JAIMIE A, DO  See "COPD"

## 2020-08-28 NOTE — Progress Notes (Signed)
INTERNAL MEDICINE TEACHING ATTENDING ADDENDUM - Aldine Contes M.D  Duration- indefinite, Indication- mechanical MVR, INR- therapeutic. Agree with pharmacy recommendations as outlined in their note.

## 2020-08-29 ENCOUNTER — Emergency Department (HOSPITAL_COMMUNITY)
Admission: EM | Admit: 2020-08-29 | Discharge: 2020-08-29 | Disposition: A | Discharge status: Home / Self Care | Payer: Medicare Other | Attending: Emergency Medicine | Admitting: Emergency Medicine

## 2020-08-29 ENCOUNTER — Other Ambulatory Visit: Payer: Self-pay

## 2020-08-29 DIAGNOSIS — Z853 Personal history of malignant neoplasm of breast: Secondary | ICD-10-CM | POA: Diagnosis not present

## 2020-08-29 DIAGNOSIS — T783XXA Angioneurotic edema, initial encounter: Secondary | ICD-10-CM | POA: Diagnosis not present

## 2020-08-29 DIAGNOSIS — Z7951 Long term (current) use of inhaled steroids: Secondary | ICD-10-CM | POA: Diagnosis not present

## 2020-08-29 DIAGNOSIS — I2510 Atherosclerotic heart disease of native coronary artery without angina pectoris: Secondary | ICD-10-CM | POA: Diagnosis not present

## 2020-08-29 DIAGNOSIS — Z79899 Other long term (current) drug therapy: Secondary | ICD-10-CM | POA: Insufficient documentation

## 2020-08-29 DIAGNOSIS — J449 Chronic obstructive pulmonary disease, unspecified: Secondary | ICD-10-CM | POA: Insufficient documentation

## 2020-08-29 DIAGNOSIS — I110 Hypertensive heart disease with heart failure: Secondary | ICD-10-CM | POA: Insufficient documentation

## 2020-08-29 DIAGNOSIS — F17210 Nicotine dependence, cigarettes, uncomplicated: Secondary | ICD-10-CM | POA: Insufficient documentation

## 2020-08-29 DIAGNOSIS — R220 Localized swelling, mass and lump, head: Secondary | ICD-10-CM | POA: Diagnosis present

## 2020-08-29 DIAGNOSIS — I5042 Chronic combined systolic (congestive) and diastolic (congestive) heart failure: Secondary | ICD-10-CM | POA: Diagnosis not present

## 2020-08-29 DIAGNOSIS — Z7901 Long term (current) use of anticoagulants: Secondary | ICD-10-CM | POA: Insufficient documentation

## 2020-08-29 MED ORDER — FAMOTIDINE IN NACL 20-0.9 MG/50ML-% IV SOLN
20.0000 mg | INTRAVENOUS | Status: AC
  Administered 2020-08-29: 20 mg via INTRAVENOUS
  Filled 2020-08-29: qty 50

## 2020-08-29 MED ORDER — DIPHENHYDRAMINE HCL 50 MG/ML IJ SOLN
25.0000 mg | Freq: Once | INTRAMUSCULAR | Status: AC
  Administered 2020-08-29: 25 mg via INTRAVENOUS
  Filled 2020-08-29: qty 1

## 2020-08-29 MED ORDER — METHYLPREDNISOLONE SODIUM SUCC 125 MG IJ SOLR
125.0000 mg | Freq: Once | INTRAMUSCULAR | Status: AC
  Administered 2020-08-29: 125 mg via INTRAVENOUS
  Filled 2020-08-29: qty 2

## 2020-08-29 NOTE — ED Notes (Signed)
Attempted 2 times for IV access. Unsuccessful. Will place IV team consult.

## 2020-08-29 NOTE — ED Notes (Signed)
Pt ambulated to restroom with one person stand by assist for safety. Steady gait.

## 2020-08-29 NOTE — ED Provider Notes (Signed)
  Physical Exam  BP 119/83   Pulse (!) 59   Temp 98.1 F (36.7 C) (Oral)   Resp 20   Ht 5\' 5"  (1.651 m)   Wt 82.1 kg   SpO2 92%   BMI 30.12 kg/m   Physical Exam Vitals and nursing note reviewed.  Constitutional:      Appearance: Normal appearance.  HENT:     Head: Normocephalic and atraumatic.     Comments: No clear swelling noted not the lips or tongue. Minimal sublingular swelling.  handling secretions easily. Drinking water without difficulty Cardiovascular:     Rate and Rhythm: Normal rate and regular rhythm.     Pulses: Normal pulses.  Pulmonary:     Effort: Pulmonary effort is normal.     Comments: Speaking in full sentences.  Neurological:     Mental Status: She is alert.     ED Course/Procedures     Procedures  MDM   Patient signed out by R. Browning, PA-C.  Please see previous notes for further history.  In brief, patient presenting for evaluation of facial swelling.  Began last night, was gradually worsening prior to arrival.  Patient is currently taking lisinopril, and may have an associated cough.  Concern that this is related to being on an ACE inhibitor.  Less likely anaphylaxis due to other cause, as patient has no known trigger and no other symptoms.  She was given symptomatic medications in the ER, which she feels is are helping.  We will continue to monitor until 8 AM.  I personally evaluated patient at 6:35 AM.  She is in no distress, handling secretions easily.  Airway intact.  Speaking in full sentences.  On recheck at 0800, pt reports continued improvement. Of sxs. She continues to have a patent airway and is handling secretions easily. She continues to have mild sublinguar swelling, but this is improving. Discussed d/c lisinopril and close f/u with PCP. At this time, pt appears safe for d/c. Return precautions given. Pt states she understands and agrees to plan.         Franchot Heidelberg, PA-C 08/29/20 0802    Lorelle Gibbs, DO 08/30/20  0703

## 2020-08-29 NOTE — ED Provider Notes (Signed)
[facility] EMERGENCY DEPARTMENT Provider Note   CSN: [id number] Arrival date & time: 08/29/20  0350     History Chief Complaint  Patient presents with  . Oral Swelling    Kim Anthony is a 70 y.o. female.  Patient presents to the emergency department with a chief complaint of tongue swelling.  She states that she noticed symptoms this evening.  She has never had this before.  She does take lisinopril.  She has tried Benadryl with no relief.  She denies any fever or chills.  Denies shortness of breath.  Denies cough.  She denies any dental pain.  The history is provided by the patient. No language interpreter was used.       Past Medical History:  Diagnosis Date  . Arthritis   . Asthma   . Breast cancer ([place]) 2001   Left Breast Cancer  . CHF (congestive heart failure) ([place])   . COPD (chronic obstructive pulmonary disease) ([place])   . Coronary artery disease   . History of mitral valve replacement with mechanical valve   . HLD (hyperlipidemia)   . Hypertension   . Pulmonary embolism [facility])     Patient Active Problem List   Diagnosis Date Noted  . Right wrist pain 05/29/2020  . Ganglion of right wrist 05/29/2020  . Eye redness 02/15/2020  . Shortness of breath 02/09/2020  . Chronic pain syndrome 01/17/2020  . Monoarthritis of wrist 10/18/2019  . Ganglion cyst 09/21/2019  . Hip pain 08/23/2019  . Thoracic ascending aortic aneurysm ([place]) 07/05/2019  . Pustular rash 03/09/2019  . Anemia 02/16/2019  . Breast cancer ([place])   . Acquired absence of left breast 12/20/2018  . Open wound of axillary region 05/11/2018  . COPD (chronic obstructive pulmonary disease) ([place]) 11/25/2017  . S/P MVR (mitral valve replacement)   . Chronic anticoagulation   . Tobacco use disorder 02/25/2017  . Lymphadenopathy, mediastinal 02/25/2017  . History of mitral valve replacement with mechanical valve 11/18/2016  . Essential hypertension 02/01/2016  . Allergic rhinitis  02/01/2016  . Healthcare maintenance 01/30/2016  . Chronic low back pain 07/22/2015  . Pulmonary embolism ([place]) 07/11/2015  . Chronic combined systolic and diastolic congestive heart failure ([place]) 07/11/2015  . Headache, common migraine 07/11/2015  . HLD (hyperlipidemia) 07/11/2015    Past Surgical History:  Procedure Laterality Date  . APPLICATION OF A-CELL OF CHEST/ABDOMEN Left 01/27/2019   Procedure: APPLICATION OF A-CELL OF CHEST;  Surgeon: Wallace Going, DO;  Location: [facility];  Service: Plastics;  Laterality: Left;  . APPLICATION OF WOUND VAC N/A 01/27/2019   Procedure: APPLICATION OF WOUND VAC;  Surgeon: Wallace Going, DO;  Location: [facility];  Service: Plastics;  Laterality: N/A;  . COLONOSCOPY WITH PROPOFOL N/A 06/07/2018   Procedure: COLONOSCOPY WITH PROPOFOL;  Surgeon: Wilford Corner, MD;  Location: [facility];  Service: Endoscopy;  Laterality: N/A;  . DEBRIDEMENT AND CLOSURE WOUND Left 12/28/2018   Procedure: Debridement And Closure Wound;  Surgeon: Coralie Keens, MD;  Location: [facility];  Service: General;  Laterality: Left;  . INCISION AND DRAINAGE OF WOUND Left 01/27/2019   Procedure: IRRIGATION AND DEBRIDEMENT OF CHEST WALL;  Surgeon: Wallace Going, DO;  Location: [facility];  Service: Plastics;  Laterality: Left;  . LATISSIMUS FLAP TO BREAST Left 01/18/2019   Procedure: LEFT LATISSIMUS FLAP TO BREAST;  Surgeon: Wallace Going, DO;  Location: [facility];  Service: Plastics;  Laterality: Left;  [name] MASTECTOMY Left 2000s   for  breast cancer, also s/p radiation and chemo  . MITRAL VALVE REPLACEMENT     mechanical  . POLYPECTOMY  06/07/2018   Procedure: POLYPECTOMY;  Surgeon: Wilford Corner, MD;  Location: [facility];  Service: Endoscopy;;  . WOUND DEBRIDEMENT Left 12/28/2018   Procedure: EXCISION OF LEFT CHRONIC CHEST WALL WOUND;  Surgeon: Coralie Keens, MD;  Location: [facility];  Service: General;  Laterality: Left;     OB History    Gravida  2   Para  2    Term  2   Preterm      AB      Living        SAB      IAB      Ectopic      Multiple      Live Births  2           Family History  Problem Relation Age of Onset  . Heart attack Other   . Breast cancer Maternal Aunt 8    Social History   Tobacco Use  . Smoking status: Current Every Day Smoker    Packs/day: 0.25    Years: 30.00    Pack years: 7.50    Types: Cigarettes  . Smokeless tobacco: Never Used  . Tobacco comment: Sometimes less. 4 per day  Vaping Use  . Vaping Use: Never used  Substance Use Topics  . Alcohol use: Yes    Comment: beer- ocasional  . Drug use: No    Home Medications Prior to Admission medications   Medication Sig Start Date End Date Taking? Authorizing Provider  albuterol (PROVENTIL) (2.5 MG/3ML) 0.083% nebulizer solution USE 1 VIAL IN NEBULIZER EVERY 6 HOURS AS NEEDED FOR WHEEZING FOR SHORTNESS OF BREATH 08/26/20   Seawell, Jaimie A, DO  albuterol (VENTOLIN HFA) 108 (90 Base) MCG/ACT inhaler Inhale 1-2 puffs into the lungs every 6 (six) hours as needed for wheezing or shortness of breath. 08/26/20   Seawell, Jaimie A, DO  Calcium-Phosphorus-Vitamin D (CITRACAL +D3 PO) Take 1 tablet by mouth 2 (two) times a day.    [provider]  cetirizine (ZYRTEC ALLERGY) 10 MG tablet Take 1 tablet (10 mg total) by mouth daily. 08/26/20 08/26/21  Seawell, Jaimie A, DO  diclofenac Sodium (VOLTAREN) 1 % GEL Apply 4 g topically 4 (four) times daily. 07/17/20   Marianna Payment, MD  fluticasone (FLONASE) 50 MCG/ACT nasal spray Place 1 spray into both nostrils daily as needed for allergies. 08/26/20 08/26/21  Seawell, Jaimie A, DO  furosemide (LASIX) 40 MG tablet Take 1 tablet (40 mg total) by mouth 2 (two) times daily. 08/26/20   Seawell, Jaimie A, DO  gabapentin (NEURONTIN) 600 MG tablet Take 1 tablet (600 mg total) by mouth 3 (three) times daily. She was supposed to wean down on gabapentin- since was on Lyrica- wean to 600 mg 3x/day then reduce dose by 300  mg/week to off, if possible- 06/26/20   Lovorn, Jinny Blossom, MD  HYDROcodone-acetaminophen (NORCO) 5-325 MG tablet Take 1 tablet by mouth every 4 (four) hours as needed for severe pain (increased to 5x/day as needed). 08/16/20   Lovorn, Jinny Blossom, MD  lisinopril (ZESTRIL) 20 MG tablet Take 1 tablet by mouth once daily 08/23/20   Maudie Mercury, MD  metoprolol succinate (TOPROL-XL) 25 MG 24 hr tablet Take 1 tablet (25 mg total) by mouth daily. 02/22/20   Cato Mulligan, MD  potassium chloride SA (KLOR-CON) 20 MEQ tablet Take 2 tablets (40 mEq total) by mouth daily. 08/26/20  Seawell, Jaimie A, DO  pregabalin (LYRICA) 100 MG capsule TAKE 1 CAPSULE BY MOUTH THREE TIMES DAILY 07/18/20   Lovorn, Jinny Blossom, MD  simvastatin (ZOCOR) 20 MG tablet Take 1 tablet by mouth once daily 08/14/20   Cato Mulligan, MD  Spacer/Aero-Holding Chambers (BREATHERITE COLL SPACER ADULT) MISC 1 applicator by Does not apply route daily. 01/11/20   [place], Carley D, DO  Tiotropium Bromide-Olodaterol (STIOLTO RESPIMAT) 2.5-2.5 MCG/ACT AERS INHALE 2 PUFFS BY MOUTH ONCE DAILY 08/26/20   Seawell, Jaimie A, DO  warfarin (COUMADIN) 5 MG tablet TAKE 1 TABLET BY MOUTH DAILY EXCEPT ON WEDNESDAYS, TAKE 1/2 TABLET ON WEDNESDAYS. 08/26/20   Pennie Banter, RPH-CPP    Allergies    Acetaminophen-codeine, Propoxyphene, Tape, and Tramadol  Review of Systems   Review of Systems  All other systems reviewed and are negative.   Physical Exam Updated Vital Signs BP (!) 156/80 (BP Location: Right Arm)   Pulse 84   Temp 98.1 F (36.7 C) (Oral)   Resp (!) 22   Ht 5\' 5"  (1.651 m)   Wt 82.1 kg   SpO2 96%   BMI 30.12 kg/m   Physical Exam Vitals and nursing note reviewed.  Constitutional:      General: She is not in acute distress.    Appearance: She is well-developed.  HENT:     Head: Normocephalic and atraumatic.     Mouth/Throat:     Comments: Edentulous Mild swelling of the sublingual space Eyes:     Conjunctiva/sclera: Conjunctivae normal.   Neck:     Comments: No erythema or swelling externally Cardiovascular:     Rate and Rhythm: Normal rate and regular rhythm.     Heart sounds: No murmur heard.   Pulmonary:     Effort: Pulmonary effort is normal. No respiratory distress.     Breath sounds: Normal breath sounds.  Abdominal:     Palpations: Abdomen is soft.     Tenderness: There is no abdominal tenderness.  Musculoskeletal:        General: Normal range of motion.     Cervical back: Neck supple.  Skin:    General: Skin is warm and dry.  Neurological:     Mental Status: She is alert and oriented to person, place, and time.  Psychiatric:        Mood and Affect: Mood normal.        Behavior: Behavior normal.     ED Results / Procedures / Treatments   Labs (all labs ordered are listed, but only abnormal results are displayed) Labs Reviewed - No data to display  EKG None  Radiology No results found.  Procedures Procedures   Medications Ordered in ED Medications  methylPREDNISolone sodium succinate (SOLU-MEDROL) 125 mg/2 mL injection 125 mg (has no administration in time range)  famotidine (PEPCID) IVPB 20 mg premix (has no administration in time range)  diphenhydrAMINE (BENADRYL) injection 25 mg (has no administration in time range)    ED Course  I have reviewed the triage vital signs and the nursing notes.  Pertinent labs & imaging results that were available during my care of the patient were reviewed by me and considered in my medical decision making (see chart for details).    MDM Rules/Calculators/A&P                          Patient here with tongue swelling.  She states symptoms started yesterday evening.  They have gradually worsened.  She did try taking Benadryl.  Patient does take lisinopril.  I have advised her to discontinue this, and to discuss this with her doctor.  Her swelling is only mild.  There is no airway compromise.  She has normal phonation.  No stridor.  She does not have any  findings suggestive of [place]'s angina.  She is edentulous, so I think the likelihood of an intraoral or sublingual abscess is unlikely.  Symptoms seem most consistent with ACE inhibitor associated angioedema.  She was given Solu-Medrol, Pepcid, Benadryl.  Patient reassessed, she states that she is feeling improved.  States that swelling seems to have gone down.  I advised her that we will continue to monitor her for another couple of hours to ensure no rebound swelling.  She is agreeable with this plan.  Patient signed out to oncoming team, who will reassess and discharge at around 8. Final Clinical Impression(s) / ED Diagnoses Final diagnoses:  Angioedema, initial encounter    Rx / DC Orders ED Discharge Orders    None       Montine Circle, PA-C 08/30/20 0029    Fatima Blank, MD 08/30/20 [phone]

## 2020-08-29 NOTE — ED Triage Notes (Addendum)
Pt had an injection today in her hand and believes now c/o swelling to face and under tongue and believes that this is the cause. Took Benadryl 4 hours ago, but states swelling is getting worse. Pt is currently taking Lisinopril and has a cough.

## 2020-08-29 NOTE — Discharge Instructions (Addendum)
You should discontinue your lisinopril.  You will need to discuss this with your doctor, as they will need to start you on something else.  If your symptoms change or worsen, please return to the emergency department.

## 2020-08-29 NOTE — Progress Notes (Signed)
Responded to consult for IV. On arrival, IV obtained by ED RN. Consult cleared.

## 2020-08-30 ENCOUNTER — Telehealth: Payer: Self-pay

## 2020-08-30 NOTE — Telephone Encounter (Signed)
Pt is requesting a call back . She stated that she had to go to urgent care /08/28/20 for her mouth beginning to swell up  She was told  To stop taking her simvastatin (ZOCOR) 20 MG tablet.. she would like to know what is she to take now instead

## 2020-08-30 NOTE — Telephone Encounter (Signed)
Return pt's call - no answer; vm full.

## 2020-09-02 NOTE — Telephone Encounter (Signed)
Returned call to patient. No answer. Recording states [facility] has not been set up yet.

## 2020-09-02 NOTE — Telephone Encounter (Signed)
Pt states she has not been called back  about her BP meds and she has questions. Please call back.

## 2020-09-02 NOTE — Telephone Encounter (Signed)
She can still take her simvastatin. It is her lisinopril she needs to have stopped.

## 2020-09-03 NOTE — Telephone Encounter (Signed)
Called / talked to pt - informed pt she can take Simvastatin,her cholesterol pill, and stop Lisinopril per Dr Sharon Seller; pt repeated instructions. I asked if she has a BP machine-stated she does; asked pt to monitor her BP and to call the office if BP's increase. Stated she will.

## 2020-09-04 ENCOUNTER — Encounter: Payer: Medicare Other | Admitting: Physical Medicine and Rehabilitation

## 2020-09-06 ENCOUNTER — Ambulatory Visit: Payer: Medicare Other | Admitting: Physical Medicine and Rehabilitation

## 2020-09-13 ENCOUNTER — Telehealth: Payer: Self-pay

## 2020-09-13 MED ORDER — HYDROCODONE-ACETAMINOPHEN 5-325 MG PO TABS: 1.0000 | ORAL_TABLET | ORAL | 0 refills | Status: DC | PRN

## 2020-09-13 NOTE — Telephone Encounter (Signed)
Patient called requesting refill on Hydrocodone, last filled #[phone] 08/16/20, next appt 09/30/20.

## 2020-09-16 ENCOUNTER — Ambulatory Visit: Payer: Medicare Other | Admitting: *Deleted

## 2020-09-16 DIAGNOSIS — Z7901 Long term (current) use of anticoagulants: Secondary | ICD-10-CM

## 2020-09-16 DIAGNOSIS — J449 Chronic obstructive pulmonary disease, unspecified: Secondary | ICD-10-CM

## 2020-09-16 DIAGNOSIS — I10 Essential (primary) hypertension: Secondary | ICD-10-CM

## 2020-09-16 DIAGNOSIS — I2699 Other pulmonary embolism without acute cor pulmonale: Secondary | ICD-10-CM

## 2020-09-16 NOTE — Chronic Care Management (AMB) (Addendum)
Care Management    RN Visit Note  09/16/2020 Name: Kim Anthony [name]: [id number] DOB: 1950/12/02  Subjective: Kim Anthony is a 70 y.o. year old female who is a primary care patient of Marianna Payment, MD. The care management team was consulted for assistance with disease management and care coordination needs.    Engaged with patient by telephone for follow up visit in response to provider referral for case management and/or care coordination services.   Consent to Services:   Kim Anthony was given information about Care Management services today including:  1. Care Management services includes personalized support from designated clinical staff supervised by her physician, including individualized plan of care and coordination with other care providers 2. 24/7 contact phone numbers for assistance for urgent and routine care needs. 3. The patient may stop case management services at any time by phone call to the office staff.  Patient agreed to services and consent obtained.   Assessment: Review of patient past medical history, allergies, medications, health status, including review of consultants reports, laboratory and other test data, was performed as part of comprehensive evaluation and provision of chronic care management services.   SDOH (Social Determinants of Health) assessments and interventions performed:    Care Plan  Allergies  Allergen Reactions  . Acetaminophen-Codeine Itching  . Propoxyphene Itching    Darvocet  . Tape Rash    Plastic  . Tramadol Itching and Nausea And Vomiting    Outpatient Encounter Medications as of 09/16/2020  Medication Sig  . albuterol (PROVENTIL) (2.5 MG/3ML) 0.083% nebulizer solution USE 1 VIAL IN NEBULIZER EVERY 6 HOURS AS NEEDED FOR WHEEZING FOR SHORTNESS OF BREATH  . albuterol (VENTOLIN HFA) 108 (90 Base) MCG/ACT inhaler Inhale 1-2 puffs into the lungs every 6 (six) hours as needed for wheezing or shortness of breath.  .  Calcium-Phosphorus-Vitamin D (CITRACAL +D3 PO) Take 1 tablet by mouth 2 (two) times a day.  . cetirizine (ZYRTEC ALLERGY) 10 MG tablet Take 1 tablet (10 mg total) by mouth daily.  . diclofenac Sodium (VOLTAREN) 1 % GEL Apply 4 g topically 4 (four) times daily.  . fluticasone (FLONASE) 50 MCG/ACT nasal spray Place 1 spray into both nostrils daily as needed for allergies.  . furosemide (LASIX) 40 MG tablet Take 1 tablet (40 mg total) by mouth 2 (two) times daily.  [name] gabapentin (NEURONTIN) 600 MG tablet Take 1 tablet (600 mg total) by mouth 3 (three) times daily. She was supposed to wean down on gabapentin- since was on Lyrica- wean to 600 mg 3x/day then reduce dose by 300 mg/week to off, if possible-  . HYDROcodone-acetaminophen (NORCO) 5-325 MG tablet Take 1 tablet by mouth every 4 (four) hours as needed for severe pain (increased to 5x/day as needed).  [name] lisinopril (ZESTRIL) 20 MG tablet Take 1 tablet by mouth once daily  . metoprolol succinate (TOPROL-XL) 25 MG 24 hr tablet Take 1 tablet (25 mg total) by mouth daily.  . potassium chloride SA (KLOR-CON) 20 MEQ tablet Take 2 tablets (40 mEq total) by mouth daily.  . pregabalin (LYRICA) 100 MG capsule TAKE 1 CAPSULE BY MOUTH THREE TIMES DAILY  . simvastatin (ZOCOR) 20 MG tablet Take 1 tablet by mouth once daily  . Spacer/Aero-Holding Chambers (BREATHERITE COLL SPACER ADULT) MISC 1 applicator by Does not apply route daily.  . Tiotropium Bromide-Olodaterol (STIOLTO RESPIMAT) 2.5-2.5 MCG/ACT AERS INHALE 2 PUFFS BY MOUTH ONCE DAILY  . warfarin (COUMADIN) 5 MG tablet TAKE 1 TABLET BY MOUTH DAILY  EXCEPT ON WEDNESDAYS, TAKE 1/2 TABLET ON WEDNESDAYS.   No facility-administered encounter medications on file as of 09/16/2020.    Patient Active Problem List   Diagnosis Date Noted  . Right wrist pain 05/29/2020  . Ganglion of right wrist 05/29/2020  . Eye redness 02/15/2020  . Shortness of breath 02/09/2020  . Chronic pain syndrome 01/17/2020  .  Monoarthritis of wrist 10/18/2019  . Ganglion cyst 09/21/2019  . Hip pain 08/23/2019  . Thoracic ascending aortic aneurysm ([place]) 07/05/2019  . Pustular rash 03/09/2019  . Anemia 02/16/2019  . Breast cancer ([place])   . Acquired absence of left breast 12/20/2018  . Open wound of axillary region 05/11/2018  . COPD (chronic obstructive pulmonary disease) ([place]) 11/25/2017  . S/P MVR (mitral valve replacement)   . Chronic anticoagulation   . Tobacco use disorder 02/25/2017  . Lymphadenopathy, mediastinal 02/25/2017  . History of mitral valve replacement with mechanical valve 11/18/2016  . Essential hypertension 02/01/2016  . Allergic rhinitis 02/01/2016  . Healthcare maintenance 01/30/2016  . Chronic low back pain 07/22/2015  . Pulmonary embolism ([place]) 07/11/2015  . Chronic combined systolic and diastolic congestive heart failure ([place]) 07/11/2015  . Headache, common migraine 07/11/2015  . HLD (hyperlipidemia) 07/11/2015    Conditions to be addressed/monitored: COPD, Asthma, HF, HTN, HLD, on Coumadin r/t MVR, chronic back pain and right wrist pain,  on opioids  Care Plan : Chronic Pain (Adult)  Updates made by Barrington Ellison, RN since 09/16/2020 12:00 AM    Problem: Chronic Pain Management (Chronic Pain)     Goal: Chronic Pain Managed- acute on chronic right wrist pain is managed   Start Date: 06/20/2020  Expected End Date: 12/19/2020  Recent Progress: On track  Priority: High  Note:   CARE PLAN ENTRY (see longitudinal plan of care for additional care plan information)  Current Barriers:  [name] Knowledge Deficits related to managing acute/chronic pain- patient states she saw Dr Fredna Dow- hand specialist on 3/9 and she received a steroid injection  "into the bone and the shot really hurt" , she says the shot did not help and her wrist remains swollen and painful, also says Voltaren Gel was prescribed by Dr Marianna Payment but it is not really helping, she says she is wearing the wrist brace and taking  her medications as prescribed. She says her back pain is about the same and continues to keep her appointments with pain management MD.   . Chronic Disease Management support and education needs related to chronic pain  Nurse Case Manager Clinical Goal(s):  [name] Over the next 30-60 days, patient will verbalize understanding of plan for managing pain . Over the next 30-60  days, patient will attend all scheduled medical appointments:   Interventions:  . Evaluation of current treatment plan related to right wrist pain and patient's adherence to plan as established by provider. . Advised patient to keep all scheduled appointments including upcoming imaging and hand specialist appointments . Discussed plans with patient for ongoing care management follow up and provided patient with direct contact information for care management team  Patient Self Care Activities:  . Patient verbalizes understanding of plan to work with providers in treating right wrist pain . Self administers medications as prescribed . Attends all scheduled provider appointments . Calls pharmacy for medication refills . Calls provider office for new concerns or questions .  keep all provider appointments  - use distraction techniques - use relaxation during pain   -wear right wrist brace -  contact provider for unrelieved severe pain Follow up Plan: The care management team will reach out to the patient again over the next 30-60 days.    Care Plan : COPD (Adult)  Updates made by Barrington Ellison, RN since 09/16/2020 12:00 AM    Problem: Symptom Exacerbation (COPD)- call provider when symptoms not improving despite   Priority: High    Long-Range Goal: Symptom Exacerbation Prevented or Minimized to prevent hosptailzatio or need for emergency treatment   Start Date: 06/20/2020  Expected End Date: 12/19/2020  Recent Progress: On track  Priority: High  Note:   Current Barriers:  [name] Knowledge deficits related to basic COPD  self care/management- spoke with patient to complete follow up assessment via phone,  she says she is using her Stiolto everyday, and continues to use her albuterol rescue inhaler and nebulizer prn, she says she has been using them more than usual because the pollen has been so bad,  she says she continues to receive monthly visits from [facility] as part of their primary care home based program    Case Manager Clinical Goal(s):  Over the next 30-60 days, patient will not be hospitalized for COPD exacerbation  Interventions:   Advised patient to self assesses COPD action plan zone and make appointment with provider if in the yellow zone for 48 hours without improvement.  Provided education about and advised patient to utilize infection prevention strategies to reduce risk of respiratory infection   Verified with patient that she continues to receive monthly visits from [place] most recent [facility] home visit note of 07/25/20- patient's BP meeting treatment targets and no c/o asthma or COPD flare requiring ED visit or hospitalization  Will send community message to [employer] representatives about patient's request for portable nebulizer - Skeet Latch messaged this CCM RN and states [employer] does not provide portable nebulizer  Called patient and told her [employer] does not provide portable nebulizer so patient requests clinic provider send Rx to [place] on [address]   09/16/20- Advised patient that provider sent the Rx for portable nebulizer sent to [place] on [address] or per Skeet Latch, [employer] representative, portable nebulizers are now in stock at [facility] in [facility] and patient could purchase one there  Patient Goals/Self-Care Activities:  . eliminate symptom triggers at home . - follow rescue plan if symptoms flare-up . - keep follow-up appointments . - use an extra pillow to sleep if needed Follow Up Plan: The care management  team will reach out to the patient again over the next 30-60 days.     Care Plan : CCM RN Hypertension (Adult)  Updates made by Barrington Ellison, RN since 09/16/2020 12:00 AM    Problem: Hypertension (Hypertension)     Goal: Hypertension Monitored   Note:   Evidence-based guidance:   Promote initial use of ambulatory blood pressure measurements (for 3 days) to rule out "white-coat" effect; identify masked hypertension and presence or absence of nocturnal "dipping" of blood pressure.    Since Lisinopril was stopped, encouraged use of home blood pressure monitoring daily and recording on blood pressure log sheet in [place] [employer] spiral bound notebook/day planner; include symptoms of hypotension or potential medication side effects in log.   Reviewed blood pressure treatment targets  [place] [employer] spiral bound calendar to patient's home address with HTN and COPD education and action plans  Notes:  Task: Identify and Monitor Blood Pressure Elevation   Due Date: 09/16/2020  Priority: Today  Note:   Care Management Activities:    - home or ambulatory blood pressure monitoring encouraged    Notes:    Problem: Disease Progression (Hypertension)   Priority: Medium  Onset Date: 09/16/2020  Note:   Current Barriers:  [name] Knowledge Deficits related to basic understanding of hypertension pathophysiology and self care management . Patient does not routinely monitor blood pressure at home - patient states she was seen in emergency department on 3/10 for lip and tongue swelling, she was told to stop Lisinopril which she has done, she says she doesn't not check her blood pressure even though she has a home monitor, she reports good medication taking behavior in regards to her other blood pressure medicine Nurse Case Manager Clinical Goal(s):  . patient will verbalize understanding of plan for hypertension management . patient will not  experience hospital admission. Hospital Admissions in last 6 months  . patient will attend all scheduled medical appointments: . patient will demonstrate improved adherence to prescribed treatment plan for hypertension as evidenced by taking all medications as prescribed, monitoring and recording blood pressure as directed, adhering to low sodium/DASH diet . patient will demonstrate improved health management independence as evidenced by checking blood pressure as directed and notifying PCP if SBP>140 or DBP > 90, taking all medications as prescribe, and adhering to a low sodium diet as discussed. Interventions:  . Collaboration with Marianna Payment, MD regarding development and update of comprehensive plan of care as evidenced by provider attestation and co-signature . Inter-disciplinary care team collaboration (see longitudinal plan of care) . Evaluation of current treatment plan related to hypertension self management and patient's adherence to plan as established by provider. . Provided education to patient re: stroke prevention, s/s of heart attack and stroke, DASH diet, complications of uncontrolled blood pressure . Reviewed medications with patient and discussed importance of compliance . Discussed plans with patient for ongoing care management follow up and provided patient with direct contact information for care management team . Advised patient, providing education and rationale, to monitor blood pressure daily and record, calling PCP for findings outside established parameters.  . Reviewed scheduled/upcoming provider appointments including:  Self-Care Activities:  Self administers medications as prescribed Attends all scheduled provider appointments Calls pharmacy for medication refills Calls provider office for new concerns or questions Monitors blood pressure daily and calls provider for consistent readings >140/>90 Patient Goals  - Self administer medications as prescribed  - Attend  all scheduled provider appointments  - Call provider office for new concerns, questions, or BP outside discussed parameters  - Check BP and record as discussed  - Follows a low sodium diet/DASH diet - check blood pressure daily - agree to work together to make changes - ask questions to understand Follow Up Plan: The care management team will reach out to the patient again over the next 30-60 days.      Kelli Churn RN, CCM, [facility] RN Care Manager [phone]

## 2020-09-16 NOTE — Patient Instructions (Signed)
Visit Information It was nice speaking with you today. Patient Care Plan: Chronic Pain (Adult)    Problem Identified: Chronic Pain Management (Chronic Pain)     Goal: Chronic Pain Managed- acute on chronic right wrist pain is managed   Start Date: 06/20/2020  Expected End Date: 12/19/2020  Recent Progress: On track  Priority: High  Note:   CARE PLAN ENTRY (see longitudinal plan of care for additional care plan information)  Current Barriers:  [name] Knowledge Deficits related to managing acute/chronic pain- patient states she saw Dr Fredna Dow- hand specialist on 3/9 and she received a steroid injection  "into the bone and the shot really hurt" , she says the shot did not help and her wrist remains swollen and painful, also says Voltaren Gel was prescribed by Dr Marianna Payment but it is not really helping, she says she is wearing the wrist brace and taking her medications as prescribed. She says her back pain is about the same and continues to keep her appointments with pain management MD.   . Chronic Disease Management support and education needs related to chronic pain  Nurse Case Manager Clinical Goal(s):  [name] Over the next 30-60 days, patient will verbalize understanding of plan for managing pain . Over the next 30-60  days, patient will attend all scheduled medical appointments:   Interventions:  . Evaluation of current treatment plan related to right wrist pain and patient's adherence to plan as established by provider. . Advised patient to keep all scheduled appointments including upcoming imaging and hand specialist appointments . Discussed plans with patient for ongoing care management follow up and provided patient with direct contact information for care management team  Patient Self Care Activities:  . Patient verbalizes understanding of plan to work with providers in treating right wrist pain . Self administers medications as prescribed . Attends all scheduled provider appointments . Calls  pharmacy for medication refills . Calls provider office for new concerns or questions .  keep all provider appointments  - use distraction techniques - use relaxation during pain   -wear right wrist brace - contact provider for unrelieved severe pain Follow up Plan: The care management team will reach out to the patient again over the next 30-60 days.    Patient Care Plan: COPD (Adult)    Problem Identified: Symptom Exacerbation (COPD)- call provider when symptoms not improving despite   Priority: High    Long-Range Goal: Symptom Exacerbation Prevented or Minimized to prevent hosptailzatio or need for emergency treatment   Start Date: 06/20/2020  Expected End Date: 12/19/2020  Recent Progress: On track  Priority: High  Note:   Current Barriers:  [name] Knowledge deficits related to basic COPD self care/management- spoke with patient to complete follow up assessment via phone,  she says she is using her Stiolto everyday, and continues to use her albuterol rescue inhaler and nebulizer prn, she says she has been using them more than usual because the pollen has been so bad,  she says she continues to receive monthly visits from [facility] as part of their primary care home based program    Case Manager Clinical Goal(s):  Over the next 30-60 days, patient will not be hospitalized for COPD exacerbation  Interventions:   Advised patient to self assesses COPD action plan zone and make appointment with provider if in the yellow zone for 48 hours without improvement.  Provided education about and advised patient to utilize infection prevention strategies to reduce risk of respiratory infection  Verified with patient that she continues to receive monthly visits from [facility]  Reviewed most recent [facility] home visit note of 07/25/20- patient's BP meeting treatment targets and no c/o asthma or COPD flare requiring ED visit or hospitalization  Will send community message to [employer]  representatives about patient's request for portable nebulizer - Skeet Latch messaged this CCM RN and states [employer] does not provide portable nebulizer  Called patient and told her [employer] does not provide portable nebulizer so patient requests clinic provider send Rx to [place] on [address]   09/16/20- Advised patient that provider sent the Rx for portable nebulizer sent to [place] on [address] since [facility] does not provide portable nebulizers Patient Goals/Self-Care Activities:  . eliminate symptom triggers at home . - follow rescue plan if symptoms flare-up . - keep follow-up appointments . - use an extra pillow to sleep if needed Follow Up Plan: The care management team will reach out to the patient again over the next 30-60 days.     Patient Care Plan: CCM RN Hypertension (Adult)    Problem Identified: Hypertension (Hypertension)     Goal: Hypertension Monitored   Note:   Evidence-based guidance:   Promote initial use of ambulatory blood pressure measurements (for 3 days) to rule out "white-coat" effect; identify masked hypertension and presence or absence of nocturnal "dipping" of blood pressure.    Since Lisinopril was stopped, encouraged use of home blood pressure monitoring daily and recording on blood pressure log sheet in [place] [employer] spiral bound notebook/day planner; include symptoms of hypotension or potential medication side effects in log.   Reviewed blood pressure treatment targets  Mailed [place] [facility] spiral bound calendar to patient's home address with HTN and COPD education and action plans  Notes:    Task: Identify and Monitor Blood Pressure Elevation   Due Date: 09/16/2020  Priority: Today  Note:   Care Management Activities:    - home or ambulatory blood pressure monitoring encouraged    Notes:    Problem Identified: Disease Progression (Hypertension)   Priority: Medium   Onset Date: 09/16/2020  Note:   Current Barriers:  [name] Knowledge Deficits related to basic understanding of hypertension pathophysiology and self care management . Patient does not routinely monitor blood pressure at home - patient states she was seen in emergency department on 3/10 for lip and tongue swelling, she was told to stop Lisinopril which she has done, she says she doesn't not check her blood pressure even though she has a home monitor, she reports good medication taking behavior in regards to her other blood pressure medicine Nurse Case Manager Clinical Goal(s):  . patient will verbalize understanding of plan for hypertension management . patient will not experience hospital admission. Hospital Admissions in last 6 months  . patient will attend all scheduled medical appointments: . patient will demonstrate improved adherence to prescribed treatment plan for hypertension as evidenced by taking all medications as prescribed, monitoring and recording blood pressure as directed, adhering to low sodium/DASH diet . patient will demonstrate improved health management independence as evidenced by checking blood pressure as directed and notifying PCP if SBP>140 or DBP > 90, taking all medications as prescribe, and adhering to a low sodium diet as discussed. Interventions:  . Collaboration with Marianna Payment, MD regarding development and update of comprehensive plan of care as evidenced by provider attestation and co-signature . Inter-disciplinary care team collaboration (see longitudinal plan of  care) . Evaluation of current treatment plan related to hypertension self management and patient's adherence to plan as established by provider. . Provided education to patient re: stroke prevention, s/s of heart attack and stroke, DASH diet, complications of uncontrolled blood pressure . Reviewed medications with patient and discussed importance of compliance . Discussed plans with patient for ongoing care  management follow up and provided patient with direct contact information for care management team . Advised patient, providing education and rationale, to monitor blood pressure daily and record, calling PCP for findings outside established parameters.  . Reviewed scheduled/upcoming provider appointments including:  Self-Care Activities:  Self administers medications as prescribed Attends all scheduled provider appointments Calls pharmacy for medication refills Calls provider office for new concerns or questions Monitors blood pressure daily and calls provider for consistent readings >140/>90 Patient Goals  - Self administer medications as prescribed  - Attend all scheduled provider appointments  - Call provider office for new concerns, questions, or BP outside discussed parameters  - Check BP and record as discussed  - Follows a low sodium diet/DASH diet - check blood pressure daily - agree to work together to make changes - ask questions to understand Follow Up Plan: The care management team will reach out to the patient again over the next 30-60 days.       The patient verbalized understanding of instructions, educational materials, and care plan provided today and declined offer to receive copy of patient instructions, educational materials, and care plan.     Kelli Churn RN, CCM, [facility] RN Care Manager [phone]

## 2020-09-17 ENCOUNTER — Encounter: Payer: Medicare Other | Admitting: Internal Medicine

## 2020-09-19 ENCOUNTER — Other Ambulatory Visit: Payer: Self-pay | Admitting: Internal Medicine

## 2020-09-19 DIAGNOSIS — R0602 Shortness of breath: Secondary | ICD-10-CM

## 2020-09-23 ENCOUNTER — Ambulatory Visit: Payer: Medicare Other

## 2020-09-26 ENCOUNTER — Emergency Department (HOSPITAL_COMMUNITY)
Admission: EM | Admit: 2020-09-26 | Discharge: 2020-09-26 | Disposition: A | Discharge status: Home / Self Care | Payer: Medicare Other | Attending: Emergency Medicine | Admitting: Emergency Medicine

## 2020-09-26 ENCOUNTER — Emergency Department (HOSPITAL_COMMUNITY): Payer: Medicare Other

## 2020-09-26 ENCOUNTER — Other Ambulatory Visit: Payer: Self-pay

## 2020-09-26 ENCOUNTER — Encounter: Payer: Medicare Other | Admitting: Student

## 2020-09-26 ENCOUNTER — Other Ambulatory Visit: Payer: Self-pay | Admitting: Internal Medicine

## 2020-09-26 ENCOUNTER — Telehealth: Payer: Self-pay

## 2020-09-26 DIAGNOSIS — F17210 Nicotine dependence, cigarettes, uncomplicated: Secondary | ICD-10-CM | POA: Diagnosis not present

## 2020-09-26 DIAGNOSIS — J45909 Unspecified asthma, uncomplicated: Secondary | ICD-10-CM | POA: Diagnosis not present

## 2020-09-26 DIAGNOSIS — Z853 Personal history of malignant neoplasm of breast: Secondary | ICD-10-CM | POA: Diagnosis not present

## 2020-09-26 DIAGNOSIS — I5042 Chronic combined systolic (congestive) and diastolic (congestive) heart failure: Secondary | ICD-10-CM | POA: Insufficient documentation

## 2020-09-26 DIAGNOSIS — M25562 Pain in left knee: Secondary | ICD-10-CM | POA: Diagnosis present

## 2020-09-26 DIAGNOSIS — I110 Hypertensive heart disease with heart failure: Secondary | ICD-10-CM | POA: Diagnosis not present

## 2020-09-26 DIAGNOSIS — I2510 Atherosclerotic heart disease of native coronary artery without angina pectoris: Secondary | ICD-10-CM | POA: Insufficient documentation

## 2020-09-26 DIAGNOSIS — J449 Chronic obstructive pulmonary disease, unspecified: Secondary | ICD-10-CM | POA: Diagnosis not present

## 2020-09-26 DIAGNOSIS — Z7901 Long term (current) use of anticoagulants: Secondary | ICD-10-CM | POA: Diagnosis not present

## 2020-09-26 DIAGNOSIS — Z79899 Other long term (current) drug therapy: Secondary | ICD-10-CM | POA: Diagnosis not present

## 2020-09-26 DIAGNOSIS — Z8739 Personal history of other diseases of the musculoskeletal system and connective tissue: Secondary | ICD-10-CM | POA: Diagnosis not present

## 2020-09-26 DIAGNOSIS — R2242 Localized swelling, mass and lump, left lower limb: Secondary | ICD-10-CM | POA: Insufficient documentation

## 2020-09-26 MED ORDER — DIPHENHYDRAMINE HCL 25 MG PO CAPS
25.0000 mg | ORAL_CAPSULE | Freq: Once | ORAL | Status: AC
  Administered 2020-09-26: 25 mg via ORAL
  Filled 2020-09-26: qty 1

## 2020-09-26 MED ORDER — PREDNISONE 10 MG PO TABS
40.0000 mg | ORAL_TABLET | Freq: Every day | ORAL | 0 refills | Status: DC
Start: 2020-09-26 — End: 2020-11-12

## 2020-09-26 MED ORDER — ALBUTEROL SULFATE HFA 108 (90 BASE) MCG/ACT IN AERS: 2.0000 | INHALATION_SPRAY | Freq: Four times a day (QID) | RESPIRATORY_TRACT | 3 refills | Status: DC | PRN

## 2020-09-26 MED ORDER — HYDROCODONE-ACETAMINOPHEN 5-325 MG PO TABS
1.0000 | ORAL_TABLET | Freq: Once | ORAL | Status: AC
  Administered 2020-09-26: 1 via ORAL
  Filled 2020-09-26: qty 1

## 2020-09-26 MED ORDER — ONDANSETRON 4 MG PO TBDP
4.0000 mg | ORAL_TABLET | Freq: Once | ORAL | Status: AC
  Administered 2020-09-26: 4 mg via ORAL
  Filled 2020-09-26: qty 1

## 2020-09-26 NOTE — ED Notes (Signed)
Patient just got back from xray and denies needs, pain meds given.

## 2020-09-26 NOTE — ED Notes (Signed)
Patient is resting comfortably. 

## 2020-09-26 NOTE — Telephone Encounter (Signed)
Ordered as requested.

## 2020-09-26 NOTE — Progress Notes (Signed)
Orthopedic Tech Progress Note Patient Details:  Kim Anthony 11-23-1950 [id number]  Ortho Devices Type of Ortho Device: Knee Immobilizer Ortho Device/Splint Location: LLE Ortho Device/Splint Interventions: Ordered,Application,Adjustment   Post Interventions Patient Tolerated: Well Instructions Provided: Care of device,Adjustment of device,Poper ambulation with device   Dusty Wagoner 09/26/2020, 4:59 PM

## 2020-09-26 NOTE — ED Provider Notes (Signed)
[facility] EMERGENCY DEPARTMENT Provider Note   CSN: [id number] Arrival date & time: 09/26/20  6553     History Chief Complaint  Patient presents with  . Knee Pain    Kim Anthony is a 70 y.o. female.  Patient with complaint of left knee pain since Sunday.  No fall or injury.  Patient at times had some pain in that area before has a history of arthritis.  Patient is followed by pain management.  And is already on Vicodin.  Patient states there was some swelling associated with it.  Patient has not followed by orthopedics.  Is followed by the internal medicine clinic downstairs.        Past Medical History:  Diagnosis Date  . Arthritis   . Asthma   . Breast cancer ([place]) 2001   Left Breast Cancer  . CHF (congestive heart failure) ([place])   . COPD (chronic obstructive pulmonary disease) ([place])   . Coronary artery disease   . History of mitral valve replacement with mechanical valve   . HLD (hyperlipidemia)   . Hypertension   . Pulmonary embolism [facility])     Patient Active Problem List   Diagnosis Date Noted  . Right wrist pain 05/29/2020  . Ganglion of right wrist 05/29/2020  . Eye redness 02/15/2020  . Shortness of breath 02/09/2020  . Chronic pain syndrome 01/17/2020  . Monoarthritis of wrist 10/18/2019  . Ganglion cyst 09/21/2019  . Hip pain 08/23/2019  . Thoracic ascending aortic aneurysm ([place]) 07/05/2019  . Pustular rash 03/09/2019  . Anemia 02/16/2019  . Breast cancer ([place])   . Acquired absence of left breast 12/20/2018  . Open wound of axillary region 05/11/2018  . COPD (chronic obstructive pulmonary disease) ([place]) 11/25/2017  . S/P MVR (mitral valve replacement)   . Chronic anticoagulation   . Tobacco use disorder 02/25/2017  . Lymphadenopathy, mediastinal 02/25/2017  . History of mitral valve replacement with mechanical valve 11/18/2016  . Essential hypertension 02/01/2016  . Allergic rhinitis 02/01/2016  . Healthcare maintenance  01/30/2016  . Chronic low back pain 07/22/2015  . Pulmonary embolism ([place]) 07/11/2015  . Chronic combined systolic and diastolic congestive heart failure ([place]) 07/11/2015  . Headache, common migraine 07/11/2015  . HLD (hyperlipidemia) 07/11/2015    Past Surgical History:  Procedure Laterality Date  . APPLICATION OF A-CELL OF CHEST/ABDOMEN Left 01/27/2019   Procedure: APPLICATION OF A-CELL OF CHEST;  Surgeon: Wallace Going, DO;  Location: [place];  Service: Plastics;  Laterality: Left;  . APPLICATION OF WOUND VAC N/A 01/27/2019   Procedure: APPLICATION OF WOUND VAC;  Surgeon: Wallace Going, DO;  Location: [place];  Service: Plastics;  Laterality: N/A;  . COLONOSCOPY WITH PROPOFOL N/A 06/07/2018   Procedure: COLONOSCOPY WITH PROPOFOL;  Surgeon: Wilford Corner, MD;  Location: [facility];  Service: Endoscopy;  Laterality: N/A;  . DEBRIDEMENT AND CLOSURE WOUND Left 12/28/2018   Procedure: Debridement And Closure Wound;  Surgeon: Coralie Keens, MD;  Location: [place];  Service: General;  Laterality: Left;  . INCISION AND DRAINAGE OF WOUND Left 01/27/2019   Procedure: IRRIGATION AND DEBRIDEMENT OF CHEST WALL;  Surgeon: Wallace Going, DO;  Location: [place];  Service: Plastics;  Laterality: Left;  . LATISSIMUS FLAP TO BREAST Left 01/18/2019   Procedure: LEFT LATISSIMUS FLAP TO BREAST;  Surgeon: Wallace Going, DO;  Location: [place];  Service: Plastics;  Laterality: Left;  [name] MASTECTOMY Left 2000s   for breast cancer, also s/p radiation and  chemo  . MITRAL VALVE REPLACEMENT     mechanical  . POLYPECTOMY  06/07/2018   Procedure: POLYPECTOMY;  Surgeon: Wilford Corner, MD;  Location: [facility];  Service: Endoscopy;;  . WOUND DEBRIDEMENT Left 12/28/2018   Procedure: EXCISION OF LEFT CHRONIC CHEST WALL WOUND;  Surgeon: Coralie Keens, MD;  Location: [place];  Service: General;  Laterality: Left;     OB History    Gravida  2   Para  2   Term  2   Preterm      AB       Living        SAB      IAB      Ectopic      Multiple      Live Births  2           Family History  Problem Relation Age of Onset  . Heart attack Other   . Breast cancer Maternal Aunt 60    Social History   Tobacco Use  . Smoking status: Current Every Day Smoker    Packs/day: 0.25    Years: 30.00    Pack years: 7.50    Types: Cigarettes  . Smokeless tobacco: Never Used  . Tobacco comment: Sometimes less. 4 per day  Vaping Use  . Vaping Use: Never used  Substance Use Topics  . Alcohol use: Yes    Comment: beer- ocasional  . Drug use: No    Home Medications Prior to Admission medications   Medication Sig Start Date End Date Taking? Authorizing Provider  predniSONE (DELTASONE) 10 MG tablet Take 4 tablets (40 mg total) by mouth daily. 09/26/20  Yes Fredia Sorrow, MD  albuterol (PROAIR HFA) 108 (90 Base) MCG/ACT inhaler Inhale 2 puffs into the lungs every 6 (six) hours as needed for wheezing or shortness of breath. 09/26/20 10/26/20  Marianna Payment, MD  albuterol (PROVENTIL) (2.5 MG/3ML) 0.083% nebulizer solution USE 1 VIAL IN NEBULIZER EVERY 6 HOURS AS NEEDED FOR WHEEZING FOR SHORTNESS OF BREATH 08/26/20   Seawell, Jaimie A, DO  albuterol (VENTOLIN HFA) 108 (90 Base) MCG/ACT inhaler Inhale 1-2 puffs into the lungs every 6 (six) hours as needed for wheezing or shortness of breath. 08/26/20   Seawell, Jaimie A, DO  Calcium-Phosphorus-Vitamin D (CITRACAL +D3 PO) Take 1 tablet by mouth 2 (two) times a day.    [provider]  cetirizine (ZYRTEC ALLERGY) 10 MG tablet Take 1 tablet (10 mg total) by mouth daily. 08/26/20 08/26/21  Seawell, Jaimie A, DO  diclofenac Sodium (VOLTAREN) 1 % GEL Apply 4 g topically 4 (four) times daily. 07/17/20   Marianna Payment, MD  fluticasone (FLONASE) 50 MCG/ACT nasal spray Place 1 spray into both nostrils daily as needed for allergies. 08/26/20 08/26/21  Seawell, Jaimie A, DO  furosemide (LASIX) 40 MG tablet Take 1 tablet by mouth twice daily 09/20/20    Marianna Payment, MD  gabapentin (NEURONTIN) 600 MG tablet Take 1 tablet (600 mg total) by mouth 3 (three) times daily. She was supposed to wean down on gabapentin- since was on Lyrica- wean to 600 mg 3x/day then reduce dose by 300 mg/week to off, if possible- 06/26/20   Lovorn, Jinny Blossom, MD  HYDROcodone-acetaminophen (NORCO) 5-325 MG tablet Take 1 tablet by mouth every 4 (four) hours as needed for severe pain (increased to 5x/day as needed). 09/13/20   Lovorn, Jinny Blossom, MD  lisinopril (ZESTRIL) 20 MG tablet Take 1 tablet by mouth once daily 08/23/20   Maudie Mercury, MD  metoprolol succinate (TOPROL-XL) 25 MG 24 hr tablet Take 1 tablet (25 mg total) by mouth daily. 02/22/20   Cato Mulligan, MD  potassium chloride SA (KLOR-CON) 20 MEQ tablet Take 2 tablets (40 mEq total) by mouth daily. 08/26/20   Seawell, Jaimie A, DO  pregabalin (LYRICA) 100 MG capsule TAKE 1 CAPSULE BY MOUTH THREE TIMES DAILY 07/18/20   Lovorn, Jinny Blossom, MD  simvastatin (ZOCOR) 20 MG tablet Take 1 tablet by mouth once daily 08/14/20   Cato Mulligan, MD  Spacer/Aero-Holding Chambers (BREATHERITE COLL SPACER ADULT) MISC 1 applicator by Does not apply route daily. 01/11/20   [place], Carley D, DO  Tiotropium Bromide-Olodaterol (STIOLTO RESPIMAT) 2.5-2.5 MCG/ACT AERS INHALE 2 PUFFS BY MOUTH ONCE DAILY 08/26/20   Seawell, Jaimie A, DO  warfarin (COUMADIN) 5 MG tablet TAKE 1 TABLET BY MOUTH DAILY EXCEPT ON WEDNESDAYS, TAKE 1/2 TABLET ON WEDNESDAYS. 08/26/20   Pennie Banter, RPH-CPP    Allergies    Acetaminophen-codeine, Propoxyphene, Tape, and Tramadol  Review of Systems   Review of Systems  Constitutional: Negative for chills and fever.  HENT: Negative for rhinorrhea and sore throat.   Eyes: Negative for visual disturbance.  Respiratory: Negative for cough and shortness of breath.   Cardiovascular: Negative for chest pain and leg swelling.  Gastrointestinal: Negative for abdominal pain, diarrhea, nausea and vomiting.  Genitourinary: Negative  for dysuria.  Musculoskeletal: Positive for arthralgias and joint swelling. Negative for back pain and neck pain.  Skin: Negative for rash.  Neurological: Negative for dizziness, light-headedness and headaches.  Hematological: Does not bruise/bleed easily.  Psychiatric/Behavioral: Negative for confusion.    Physical Exam Updated Vital Signs BP 125/79 (BP Location: Right Arm)   Pulse 88   Temp 99.8 F (37.7 C) (Oral)   Resp (!) 22   SpO2 94%   Physical Exam Vitals and nursing note reviewed.  Constitutional:      General: She is not in acute distress.    Appearance: Normal appearance. She is well-developed.  HENT:     Head: Normocephalic and atraumatic.  Eyes:     Extraocular Movements: Extraocular movements intact.     Conjunctiva/sclera: Conjunctivae normal.     Pupils: Pupils are equal, round, and reactive to light.  Cardiovascular:     Rate and Rhythm: Normal rate and regular rhythm.     Heart sounds: No murmur heard.   Pulmonary:     Effort: Pulmonary effort is normal. No respiratory distress.     Breath sounds: Normal breath sounds.  Abdominal:     Palpations: Abdomen is soft.     Tenderness: There is no abdominal tenderness.  Musculoskeletal:        General: Swelling present.     Cervical back: Neck supple.     Right lower leg: No edema.     Left lower leg: No edema.     Comments: Patient with swelling to the left knee area.  Patella is not dislocated.  Patient with limited range of motion due to the pain.  Distally no swelling or tenderness to the calf ankle or foot.  Dorsalis pedis pulses 1+.  Good cap refill.  Sensation intact distally.  Right knee area and leg without any acute findings.  Skin:    General: Skin is warm and dry.  Neurological:     General: No focal deficit present.     Mental Status: She is alert and oriented to person, place, and time.     Cranial Nerves: No cranial nerve deficit.  Sensory: No sensory deficit.     Motor: No weakness.      ED Results / Procedures / Treatments   Labs (all labs ordered are listed, but only abnormal results are displayed) Labs Reviewed - No data to display  EKG None  Radiology DG Knee Complete 4 Views Left  Result Date: 09/26/2020 CLINICAL DATA:  Pain and swelling EXAM: LEFT KNEE - COMPLETE 4+ VIEW COMPARISON:  August 10, 2017 FINDINGS: Frontal, lateral, and bilateral oblique views were obtained. There is no appreciable fracture or dislocation. There is a moderate knee joint effusion. There is no appreciable joint space narrowing or erosion. IMPRESSION: Moderate knee joint effusion. No fracture or dislocation. No appreciable underlying joint space narrowing or erosion. Electronically Signed   By: Lowella Grip III M.D.   On: 09/26/2020 10:46    Procedures Procedures   Medications Ordered in ED Medications  HYDROcodone-acetaminophen (NORCO/VICODIN) 5-325 MG per tablet 1 tablet (1 tablet Oral Given 09/26/20 1031)  ondansetron (ZOFRAN-ODT) disintegrating tablet 4 mg (4 mg Oral Given 09/26/20 1031)  diphenhydrAMINE (BENADRYL) capsule 25 mg (25 mg Oral Given 09/26/20 1031)    ED Course  I have reviewed the triage vital signs and the nursing notes.  Pertinent labs & imaging results that were available during my care of the patient were reviewed by me and considered in my medical decision making (see chart for details).    MDM Rules/Calculators/A&P                          Clinically doubt septic joint.  Think that this is a inflammatory arthritis.  No significant effusion.  Will treat with knee immobilizer and prednisone.  Patient already on pain medication and cannot have any additional pain medication.  But was improved here with the hydrocodone she received here.  Patient will be given referral to orthopedics as well is back to her internal medicine clinic.  She will return for any new or worse symptoms.    Final Clinical Impression(s) / ED Diagnoses Final diagnoses:  Acute pain  of left knee    Rx / DC Orders ED Discharge Orders         Ordered    predniSONE (DELTASONE) 10 MG tablet  Daily        09/26/20 1144           Fredia Sorrow, MD 09/26/20 1149

## 2020-09-26 NOTE — Telephone Encounter (Signed)
Received fax from [facility] asking for a new RX for [employer] inhaler which is covered by patient's insurance plan.  As of note, pt currently in ED. Thanks, SChaplin, RN,BSN

## 2020-09-26 NOTE — Discharge Instructions (Addendum)
Wear the knee immobilizer at all times except for when bathing.  Take the prednisone as directed for the next 5 days.  Follow-up with internal medicine clinic.  Make an appointment to follow-up with orthopedic surgery.

## 2020-09-26 NOTE — ED Triage Notes (Signed)
Pt reports L knee pain and swelling without injury since Sunday. Hx arthritis. Took a vicodin last night without relief.

## 2020-09-26 NOTE — ED Notes (Signed)
Patient is awaiting to see the ortho tech for a knee immobilizer, pain has improved, discharge paperwork given.

## 2020-09-27 DIAGNOSIS — M25562 Pain in left knee: Secondary | ICD-10-CM | POA: Insufficient documentation

## 2020-09-30 ENCOUNTER — Other Ambulatory Visit: Payer: Self-pay | Admitting: *Deleted

## 2020-09-30 ENCOUNTER — Encounter
Payer: Medicare Other | Attending: Physical Medicine and Rehabilitation | Admitting: Physical Medicine and Rehabilitation

## 2020-09-30 ENCOUNTER — Encounter: Payer: Self-pay | Admitting: Physical Medicine and Rehabilitation

## 2020-09-30 ENCOUNTER — Other Ambulatory Visit: Payer: Self-pay

## 2020-09-30 ENCOUNTER — Encounter: Payer: Medicare Other | Admitting: Physical Medicine and Rehabilitation

## 2020-09-30 ENCOUNTER — Ambulatory Visit: Payer: Medicare Other | Admitting: Student-PharmD

## 2020-09-30 VITALS — BP 170/88 | HR 88 | Temp 99.1°F | Ht 65.0 in | Wt 183.0 lb

## 2020-09-30 DIAGNOSIS — M5441 Lumbago with sciatica, right side: Secondary | ICD-10-CM | POA: Diagnosis not present

## 2020-09-30 DIAGNOSIS — I2699 Other pulmonary embolism without acute cor pulmonale: Secondary | ICD-10-CM

## 2020-09-30 DIAGNOSIS — G8929 Other chronic pain: Secondary | ICD-10-CM | POA: Diagnosis present

## 2020-09-30 DIAGNOSIS — M1009 Idiopathic gout, multiple sites: Secondary | ICD-10-CM

## 2020-09-30 DIAGNOSIS — G894 Chronic pain syndrome: Secondary | ICD-10-CM | POA: Diagnosis present

## 2020-09-30 DIAGNOSIS — Z5181 Encounter for therapeutic drug level monitoring: Secondary | ICD-10-CM

## 2020-09-30 DIAGNOSIS — Z7901 Long term (current) use of anticoagulants: Secondary | ICD-10-CM

## 2020-09-30 DIAGNOSIS — Z79891 Long term (current) use of opiate analgesic: Secondary | ICD-10-CM | POA: Diagnosis present

## 2020-09-30 HISTORY — DX: Idiopathic gout, multiple sites: M10.09

## 2020-09-30 LAB — POCT INR: INR: 4.6 — AB (ref 2.0–3.0)

## 2020-09-30 MED ORDER — HYDROCODONE-ACETAMINOPHEN 5-325 MG PO TABS: 1.0000 | ORAL_TABLET | ORAL | 0 refills | Status: DC | PRN

## 2020-09-30 MED ORDER — PREGABALIN 50 MG PO CAPS: 50.0000 mg | ORAL_CAPSULE | Freq: Three times a day (TID) | ORAL | 0 refills | Status: DC

## 2020-09-30 MED ORDER — GABAPENTIN 600 MG PO TABS: 900.0000 mg | ORAL_TABLET | Freq: Three times a day (TID) | ORAL | 5 refills | Status: DC

## 2020-09-30 NOTE — Progress Notes (Signed)
INTERNAL MEDICINE TEACHING ATTENDING ADDENDUM - Yurani Fettes M.D  Duration- indefinite, Indication- mechanical mitral valve, INR- supratherapeutic. Agree with pharmacy recommendations as outlined in their note.

## 2020-09-30 NOTE — Addendum Note (Signed)
Addended by: Jasmine December T on: 09/30/2020 03:16 PM   Modules accepted: Orders

## 2020-09-30 NOTE — Patient Instructions (Signed)
  Patient is a 70 yr old female with aortic stenosis s/p AVR on Coumadin, HTN, hx of PE's on chronic coumadin, CHF, and COPD- here forf/uof R side low back Pain with sciatica. Tramadol makes her ill as well as Tylenol #3 and Darvocet.  Here for f/u on chronic pain.  1. Chronic pain- Will refill Norco 5/325 mg q6 hours prn- #150 5x/day. Is due to refill 4/25, but will refill early- since off that week.  Cannot pick up early.   2. Will get UDS- urine drug screen since has been since July 2022.    3. Will increase gabapentin to 900 mg 3x/day- CR/BUN look OK last month-   4. Will reduce Lyrica to 50 mg 3x/day x 3 days, then 50 mg nightly x 3 days, then stop.  To get off Lyrica- so can have on 1 medicine.   5. Gout- made worse by processed meat- ham, bacon, sausage, etc; also any alcohol- ANY alcohol can make it worse- ALL Alcohol, including beer should be cut out. Reduce seafood , but don't cut it out.   6. F/U in 3 months.

## 2020-09-30 NOTE — Patient Instructions (Signed)
Patient instructed to take medications as defined in the Anti-coagulation Track section of this encounter.  Patient instructed to omit today's dose.  Patient instructed to take 1/2 tablet on Tuesday and Wednesday, then 1 tablet once daily until INR follow-up visit  Patient verbalized understanding of these instructions.

## 2020-09-30 NOTE — Progress Notes (Signed)
Anticoagulation Management Kim Anthony is a 70 y.o. female who reports to the clinic for monitoring of warfarin treatment.    Indication: history of PE, long-term anticoagulation management on warfarin Duration: indefinite Supervising physician: [facility] Visit History: Patient does not report signs/symptoms of bleeding or thromboembolism  Other recent changes: No changes to diet, medications, lifestyle. Recently finished course of prednisone on 4/10 (10mg  daily x5d)  Anticoagulation Episode Summary    Current INR goal:  2.5-3.5  TTR:  28.0 % (4.7 y)  Next INR check:  10/07/2020  INR from last check:  4.6 (09/30/2020)  Weekly max warfarin dose:    Target end date:    INR check location:  Anticoagulation Clinic  Preferred lab:    Send INR reminders to:     Indications   Pulmonary embolism ([facility]) [I26.99]       Comments:          Allergies  Allergen Reactions  . Acetaminophen-Codeine Itching  . Propoxyphene Itching    Darvocet  . Tape Rash    Plastic  . Tramadol Itching and Nausea And Vomiting    Current Outpatient Medications:  .  albuterol (PROAIR HFA) 108 (90 Base) MCG/ACT inhaler, Inhale 2 puffs into the lungs every 6 (six) hours as needed for wheezing or shortness of breath., Disp: 6.7 g, Rfl: 3 .  albuterol (PROVENTIL) (2.5 MG/3ML) 0.083% nebulizer solution, USE 1 VIAL IN NEBULIZER EVERY 6 HOURS AS NEEDED FOR WHEEZING FOR SHORTNESS OF BREATH, Disp: 90 mL, Rfl: 0 .  albuterol (VENTOLIN HFA) 108 (90 Base) MCG/ACT inhaler, Inhale 1-2 puffs into the lungs every 6 (six) hours as needed for wheezing or shortness of breath., Disp: 6.7 g, Rfl: 11 .  Calcium-Phosphorus-Vitamin D (CITRACAL +D3 PO), Take 1 tablet by mouth 2 (two) times a day., Disp: , Rfl:  .  cetirizine (ZYRTEC ALLERGY) 10 MG tablet, Take 1 tablet (10 mg total) by mouth daily., Disp: 30 tablet, Rfl: 2 .  diclofenac Sodium (VOLTAREN) 1 % GEL, Apply 4 g topically 4 (four) times  daily., Disp: 480 g, Rfl: 2 .  fluticasone (FLONASE) 50 MCG/ACT nasal spray, Place 1 spray into both nostrils daily as needed for allergies., Disp: 16 each, Rfl: 11 .  furosemide (LASIX) 40 MG tablet, Take 1 tablet by mouth twice daily, Disp: 60 tablet, Rfl: 0 .  gabapentin (NEURONTIN) 600 MG tablet, Take 1 tablet (600 mg total) by mouth 3 (three) times daily. She was supposed to wean down on gabapentin- since was on Lyrica- wean to 600 mg 3x/day then reduce dose by 300 mg/week to off, if possible-, Disp: 180 tablet, Rfl: 2 .  HYDROcodone-acetaminophen (NORCO) 5-325 MG tablet, Take 1 tablet by mouth every 4 (four) hours as needed for severe pain (increased to 5x/day as needed)., Disp: 150 tablet, Rfl: 0 .  lisinopril (ZESTRIL) 20 MG tablet, Take 1 tablet by mouth once daily, Disp: 90 tablet, Rfl: 0 .  metoprolol succinate (TOPROL-XL) 25 MG 24 hr tablet, Take 1 tablet (25 mg total) by mouth daily., Disp: 90 tablet, Rfl: 3 .  potassium chloride SA (KLOR-CON) 20 MEQ tablet, Take 2 tablets (40 mEq total) by mouth daily., Disp: 60 tablet, Rfl: 0 .  pregabalin (LYRICA) 100 MG capsule, TAKE 1 CAPSULE BY MOUTH THREE TIMES DAILY, Disp: 90 capsule, Rfl: 5 .  simvastatin (ZOCOR) 20 MG tablet, Take 1 tablet by mouth once daily, Disp: 90 tablet, Rfl: 0 .  Spacer/Aero-Holding Chambers (BREATHERITE COLL SPACER ADULT) MISC,  1 applicator by Does not apply route daily., Disp: 1 each, Rfl: 0 .  Tiotropium Bromide-Olodaterol (STIOLTO RESPIMAT) 2.5-2.5 MCG/ACT AERS, INHALE 2 PUFFS BY MOUTH ONCE DAILY, Disp: 4 g, Rfl: 0 .  warfarin (COUMADIN) 4 MG tablet, Take by mouth., Disp: , Rfl:  .  warfarin (COUMADIN) 5 MG tablet, TAKE 1 TABLET BY MOUTH DAILY EXCEPT ON WEDNESDAYS, TAKE 1/2 TABLET ON WEDNESDAYS., Disp: 30 tablet, Rfl: 1 .  predniSONE (DELTASONE) 10 MG tablet, Take 4 tablets (40 mg total) by mouth daily. (Patient not taking: Reported on 09/30/2020), Disp: 20 tablet, Rfl: 0 Past Medical History:  Diagnosis Date  .  Arthritis   . Asthma   . Breast cancer ([place]) 2001   Left Breast Cancer  . CHF (congestive heart failure) ([place])   . COPD (chronic obstructive pulmonary disease) ([place])   . Coronary artery disease   . History of mitral valve replacement with mechanical valve   . HLD (hyperlipidemia)   . Hypertension   . Pulmonary embolism [facility])    Social History   Socioeconomic History  . Marital status: Single    Spouse name: Not on file  . Number of children: Not on file  . Years of education: Not on file  . Highest education level: Not on file  Occupational History  . Not on file  Tobacco Use  . Smoking status: Current Every Day Smoker    Packs/day: 0.25    Years: 30.00    Pack years: 7.50    Types: Cigarettes  . Smokeless tobacco: Never Used  . Tobacco comment: Sometimes less. 4 per day  Vaping Use  . Vaping Use: Never used  Substance and Sexual Activity  . Alcohol use: Yes    Comment: beer- ocasional  . Drug use: No  . Sexual activity: Not Currently    Birth control/protection: None  Other Topics Concern  . Not on file  Social History Narrative   ** Merged History Encounter **       Social Determinants of Health   Financial Resource Strain: Low Risk   . Difficulty of Paying Living Expenses: Not hard at all  Food Insecurity: No Food Insecurity  . Worried About Charity fundraiser in the Last Year: Never true  . Ran Out of Food in the Last Year: Never true  Transportation Needs: No Transportation Needs  . Lack of Transportation (Medical): No  . Lack of Transportation (Non-Medical): No  Physical Activity: Not on file  Stress: Not on file  Social Connections: Socially Isolated  . Frequency of Communication with Friends and Family: More than three times a week  . Frequency of Social Gatherings with Friends and Family: More than three times a week  . Attends Religious Services: Never  . Active Member of Clubs or Organizations: No  . Attends Archivist Meetings: Not  on file  . Marital Status: Never married   Family History  Problem Relation Age of Onset  . Heart attack Other   . Breast cancer Maternal Aunt 50    ASSESSMENT Recent Results: The most recent result is correlated with 32.5 mg per week: Lab Results  Component Value Date   INR 4.6 (A) 09/30/2020   INR 2.6 08/26/2020   INR 1.4 (A) 02/28/2020    Anticoagulation Dosing:   INR today: Supratherapeutic  PLAN Weekly dose was decreased by 23% to 25 mg per week  Patient Instructions  Patient instructed to take medications as defined in the Anti-coagulation Track section  of this encounter.  Patient instructed to omit today's dose.  Patient instructed to take 1/2 tablet on Tuesday and Wednesday, then 1 tablet once daily until INR follow-up visit  Patient verbalized understanding of these instructions.    Patient advised to contact clinic or seek medical attention if signs/symptoms of bleeding or thromboembolism occur.  Patient verbalized understanding by repeating back information and was advised to contact me if further medication-related questions arise. Patient was also provided an information handout.  Refill requested - patient was provided 30DS with 1RF on 08/26/20   Follow-up Return in 1 week (on 10/07/2020), or Repeat INR follow-up 4/18 at 8:30am.  Mercy Riding, PharmD PGY1 Acute Care Pharmacy Resident  15 minutes spent face-to-face with the patient during the encounter. 50% of time spent on education, including signs/sx bleeding and clotting, as well as food and drug interactions with warfarin. 50% of time was spent on fingerprick POC INR sample collection,processing, results determination, and documentation in [email or web address]

## 2020-09-30 NOTE — Progress Notes (Signed)
Subjective:    Patient ID: Kim Anthony, female    DOB: 12/02/1950, 70 y.o.   [name]: [id number]  HPI   Patient is a 70 yr old female with aortic stenosis s/p AVR on Coumadin, HTN, hx of PE's on chronic coumadin, CHF, and COPD- here forf/uof R side low back Pain with sciatica. Tramadol makes her ill as well as Tylenol #3 and Darvocet.  Here for f/u on chronic pain.   L leg is "messed up".  Went Friday to Ortho doctor-  Got 3 tubes out of L knee Friday And had steroid injection today.  Couldn't walk Thursday due to pain of L knee from a LOT of fluid- but got taken off Friday.    Got cold- got it in February and 1/2 of March- too sick. Didn't have COVID.   Got another appointment for blood count-    Some days, everything is painful. Meds didn't work- but sounds like had gout.   R toe was also swollen like L knee was swollen,, at least L knee wasn't based on fluid. Was placed on Prednisone 40 mg daily x 5 days- was likely due to gout.   Still taking Gabapentin AND Lyrica.       Pain Inventory Average Pain 8 Pain Right Now 7 My pain is aching  In the last 24 hours, has pain interfered with the following? General activity 7 Relation with others 7 Enjoyment of life 7 What TIME of day is your pain at its worst? daytime Sleep (in general) Fair  Pain is worse with: walking and some activites Pain improves with: medication Relief from Meds: 5  Family History  Problem Relation Age of Onset  . Heart attack Other   . Breast cancer Maternal Aunt 74   Social History   Socioeconomic History  . Marital status: Single    Spouse name: Not on file  . Number of children: Not on file  . Years of education: Not on file  . Highest education level: Not on file  Occupational History  . Not on file  Tobacco Use  . Smoking status: Current Every Day Smoker    Packs/day: 0.25    Years: 30.00    Pack years: 7.50    Types: Cigarettes  . Smokeless tobacco: Never Used  .  Tobacco comment: Sometimes less. 4 per day  Vaping Use  . Vaping Use: Never used  Substance and Sexual Activity  . Alcohol use: Yes    Comment: beer- ocasional  . Drug use: No  . Sexual activity: Not Currently    Birth control/protection: None  Other Topics Concern  . Not on file  Social History Narrative   ** Merged History Encounter **       Social Determinants of Health   Financial Resource Strain: Low Risk   . Difficulty of Paying Living Expenses: Not hard at all  Food Insecurity: No Food Insecurity  . Worried About Charity fundraiser in the Last Year: Never true  . Ran Out of Food in the Last Year: Never true  Transportation Needs: No Transportation Needs  . Lack of Transportation (Medical): No  . Lack of Transportation (Non-Medical): No  Physical Activity: Not on file  Stress: Not on file  Social Connections: Socially Isolated  . Frequency of Communication with Friends and Family: More than three times a week  . Frequency of Social Gatherings with Friends and Family: More than three times a week  . Attends Religious Services: Never  .  Active Member of Clubs or Organizations: No  . Attends Archivist Meetings: Not on file  . Marital Status: Never married   Past Surgical History:  Procedure Laterality Date  . APPLICATION OF A-CELL OF CHEST/ABDOMEN Left 01/27/2019   Procedure: APPLICATION OF A-CELL OF CHEST;  Surgeon: Wallace Going, DO;  Location: [facility];  Service: Plastics;  Laterality: Left;  . APPLICATION OF WOUND VAC N/A 01/27/2019   Procedure: APPLICATION OF WOUND VAC;  Surgeon: Wallace Going, DO;  Location: [facility];  Service: Plastics;  Laterality: N/A;  . COLONOSCOPY WITH PROPOFOL N/A 06/07/2018   Procedure: COLONOSCOPY WITH PROPOFOL;  Surgeon: Wilford Corner, MD;  [facility];  Service: Endoscopy;  Laterality: N/A;  . DEBRIDEMENT AND CLOSURE WOUND Left 12/28/2018   Procedure: Debridement And Closure Wound;  Surgeon: Coralie Keens, MD;  Location: [facility];  Service: General;  Laterality: Left;  . INCISION AND DRAINAGE OF WOUND Left 01/27/2019   Procedure: IRRIGATION AND DEBRIDEMENT OF CHEST WALL;  Surgeon: Wallace Going, DO;  Location: [facility];  Service: Plastics;  Laterality: Left;  . LATISSIMUS FLAP TO BREAST Left 01/18/2019   Procedure: LEFT LATISSIMUS FLAP TO BREAST;  Surgeon: Wallace Going, DO;  Location: [facility];  Service: Plastics;  Laterality: Left;  [name] MASTECTOMY Left 2000s   for breast cancer, also s/p radiation and chemo  . MITRAL VALVE REPLACEMENT     mechanical  . POLYPECTOMY  06/07/2018   Procedure: POLYPECTOMY;  Surgeon: Wilford Corner, MD;  [facility];  Service: Endoscopy;;  . WOUND DEBRIDEMENT Left 12/28/2018   Procedure: EXCISION OF LEFT CHRONIC CHEST WALL WOUND;  Surgeon: Coralie Keens, MD;  Location: [facility];  Service: General;  Laterality: Left;   Past Surgical History:  Procedure Laterality Date  . APPLICATION OF A-CELL OF CHEST/ABDOMEN Left 01/27/2019   Procedure: APPLICATION OF A-CELL OF CHEST;  Surgeon: Wallace Going, DO;  Location: [facility];  Service: Plastics;  Laterality: Left;  . APPLICATION OF WOUND VAC N/A 01/27/2019   Procedure: APPLICATION OF WOUND VAC;  Surgeon: Wallace Going, DO;  Location: [facility];  Service: Plastics;  Laterality: N/A;  . COLONOSCOPY WITH PROPOFOL N/A 06/07/2018   Procedure: COLONOSCOPY WITH PROPOFOL;  Surgeon: Wilford Corner, MD;  [facility];  Service: Endoscopy;  Laterality: N/A;  . DEBRIDEMENT AND CLOSURE WOUND Left 12/28/2018   Procedure: Debridement And Closure Wound;  Surgeon: Coralie Keens, MD;  Location: [facility];  Service: General;  Laterality: Left;  . INCISION AND DRAINAGE OF WOUND Left 01/27/2019   Procedure: IRRIGATION AND DEBRIDEMENT OF CHEST WALL;  Surgeon: Wallace Going, DO;  Location: [facility];  Service: Plastics;  Laterality: Left;  . LATISSIMUS FLAP TO BREAST Left 01/18/2019   Procedure: LEFT  LATISSIMUS FLAP TO BREAST;  Surgeon: Wallace Going, DO;  Location: [facility];  Service: Plastics;  Laterality: Left;  [name] MASTECTOMY Left 2000s   for breast cancer, also s/p radiation and chemo  . MITRAL VALVE REPLACEMENT     mechanical  . POLYPECTOMY  06/07/2018   Procedure: POLYPECTOMY;  Surgeon: Wilford Corner, MD;  [facility];  Service: Endoscopy;;  . WOUND DEBRIDEMENT Left 12/28/2018   Procedure: EXCISION OF LEFT CHRONIC CHEST WALL WOUND;  Surgeon: Coralie Keens, MD;  Location: [facility];  Service: General;  Laterality: Left;   Past Medical History:  Diagnosis Date  . Arthritis   . Asthma   . Breast cancer ([place]) 2001   Left Breast Cancer  .  CHF (congestive heart failure) ([place])   . COPD (chronic obstructive pulmonary disease) ([place])   . Coronary artery disease   . History of mitral valve replacement with mechanical valve   . HLD (hyperlipidemia)   . Hypertension   . Pulmonary embolism ([facility])    BP (!) 170/88   Pulse 88   Temp 99.1 F (37.3 C)   Ht 5\' 5"  (1.651 m)   Wt 183 lb (83 kg)   SpO2 94%   BMI 30.45 kg/m   Opioid Risk Score:   Fall Risk Score:  `1  Depression screen PHQ 2/9  Depression screen [facility] 2/9 08/26/2020 06/11/2020 05/03/2020 03/19/2020 02/28/2020 11/01/2019 09/14/2019  Decreased Interest 0 0 0 0 0 0 0  Down, Depressed, Hopeless 0 0 0 0 0 0 0  PHQ - 2 Score 0 0 0 0 0 0 0  Altered sleeping - - - - - 3 1  Tired, decreased energy - - - - - 1 1  Change in appetite - - - - - 1 0  Feeling bad or failure about yourself  - - - - - 0 0  Trouble concentrating - - - - - 0 0  Moving slowly or fidgety/restless - - - - - 0 0  Suicidal thoughts - - - - - 0 0  PHQ-9 Score - - - - - 5 2  Difficult doing work/chores - - - - - Somewhat difficult Not difficult at all  Some recent data might be hidden    Review of Systems An entire ROS was completed- was negative except for HPI.     Objective:   Physical Exam Awake, alert, appropriate, using cane, NAD L  knee slightly swollen- and s/p injection R toe not swollen/red anymore        Assessment & Plan:     Patient is a 70 yr old female with aortic stenosis s/p AVR on Coumadin, HTN, hx of PE's on chronic coumadin, CHF, and COPD- here forf/uof R side low back Pain with sciatica. Tramadol makes her ill as well as Tylenol #3 and Darvocet.  Here for f/u on chronic pain.  1. Chronic pain- Will refill Norco 5/325 mg q6 hours prn- #150 5x/day. Is due to refill 4/25, but will refill early- since off that week.  Cannot pick up early.   2. Will get UDS- urine drug screen since has been since July 2022.    3. Will increase gabapentin to 900 mg 3x/day- CR/BUN look OK last month-   4. Will reduce Lyrica to 50 mg 3x/day x 3 days, then 50 mg nightly x 3 days, then stop.  To get off Lyrica- so can have on 1 medicine.   5. Gout- made worse by processed meat- ham, bacon, sausage, etc; also any alcohol- ANY alcohol can make it worse- ALL Alcohol, including beer should be cut out. Reduce seafood , but don't cut it out.   6. F/U in 3 months.   Educated on gout, and stopping alcohol, as well as processed meats; as well as made meds changes as above and renewed pain meds/UDS. I spent a total of 20 minutes on visit- as detailed above.

## 2020-10-03 ENCOUNTER — Other Ambulatory Visit: Payer: Self-pay | Admitting: Internal Medicine

## 2020-10-03 DIAGNOSIS — J449 Chronic obstructive pulmonary disease, unspecified: Secondary | ICD-10-CM

## 2020-10-03 NOTE — Telephone Encounter (Signed)
Pt is calling regarding inhaler [phone]

## 2020-10-04 ENCOUNTER — Ambulatory Visit (HOSPITAL_COMMUNITY): Payer: Medicare Other

## 2020-10-04 ENCOUNTER — Other Ambulatory Visit: Payer: Self-pay | Admitting: Orthopedic Surgery

## 2020-10-04 DIAGNOSIS — M25562 Pain in left knee: Secondary | ICD-10-CM

## 2020-10-04 LAB — TOXASSURE SELECT,+ANTIDEPR,UR

## 2020-10-07 ENCOUNTER — Ambulatory Visit: Payer: Medicare Other

## 2020-10-09 ENCOUNTER — Telehealth: Payer: Self-pay | Admitting: *Deleted

## 2020-10-09 NOTE — Telephone Encounter (Signed)
Urine drug screen for this encounter is consistent for prescribed medication 

## 2020-10-10 ENCOUNTER — Ambulatory Visit: Payer: Medicare Other

## 2020-10-12 ENCOUNTER — Other Ambulatory Visit: Payer: Self-pay

## 2020-10-12 ENCOUNTER — Other Ambulatory Visit: Payer: Self-pay | Admitting: Internal Medicine

## 2020-10-12 ENCOUNTER — Ambulatory Visit
Admission: RE | Admit: 2020-10-12 | Discharge: 2020-10-12 | Disposition: A | Discharge status: Home / Self Care | Payer: Medicare Other | Source: Ambulatory Visit | Attending: Internal Medicine | Admitting: Internal Medicine

## 2020-10-12 DIAGNOSIS — Z1231 Encounter for screening mammogram for malignant neoplasm of breast: Secondary | ICD-10-CM

## 2020-10-12 DIAGNOSIS — R0602 Shortness of breath: Secondary | ICD-10-CM

## 2020-10-14 ENCOUNTER — Telehealth: Payer: Self-pay

## 2020-10-14 NOTE — Telephone Encounter (Signed)
I don't see any reason, unless she more forthcoming, on why she needs early- I will allow if she's had surgery, dental procedure, or a fall requiring increased pain meds- otherwise, I cannot see why it's appropriate.  Please let me know- thanks, ML

## 2020-10-14 NOTE — Telephone Encounter (Signed)
Patient called stating she need a early refill. Medication has been sent in but pharmacy needs the ok from the doctor to refill early. Hydrocodone/APAP, is this okay?

## 2020-10-15 ENCOUNTER — Ambulatory Visit: Payer: Medicare Other | Admitting: *Deleted

## 2020-10-15 DIAGNOSIS — I10 Essential (primary) hypertension: Secondary | ICD-10-CM

## 2020-10-15 DIAGNOSIS — J449 Chronic obstructive pulmonary disease, unspecified: Secondary | ICD-10-CM

## 2020-10-15 DIAGNOSIS — I2699 Other pulmonary embolism without acute cor pulmonale: Secondary | ICD-10-CM

## 2020-10-15 NOTE — Telephone Encounter (Signed)
I called patient and she states that pharmacy told her it would be ready for pick up today and she is in line to get prescription. She states her leg has been hurting real bad and swollen. She states she fell last night.

## 2020-10-15 NOTE — Progress Notes (Signed)
Thank you :)

## 2020-10-15 NOTE — Chronic Care Management (AMB) (Signed)
Care Management    RN Visit Note  10/15/2020 Name: Kim Anthony [name]: [id number] DOB: [birth year]  Subjective: Kim Anthony is a 70 y.o. year old female who is a primary care patient of Kim Payment, MD. The care management team was consulted for assistance with disease management and care coordination needs.    Engaged with patient by telephone for follow up visit in response to provider referral for case management and/or care coordination services.   Consent to Services:   [name] was given information about Care Management services today including:  1. Care Management services includes personalized support from designated clinical staff supervised by her physician, including individualized plan of care and coordination with other care providers 2. 24/7 contact phone numbers for assistance for urgent and routine care needs. 3. The patient may stop case management services at any time by phone call to the office staff.  Patient agreed to services and consent obtained.   Assessment: Review of patient past medical history, allergies, medications, health status, including review of consultants reports, laboratory and other test data, was performed as part of comprehensive evaluation and provision of chronic care management services.   SDOH (Social Determinants of Health) assessments and interventions performed:    Care Plan  Allergies  Allergen Reactions  . Acetaminophen-Codeine Itching  . Propoxyphene Itching    Darvocet  . Tape Rash    Plastic  . Tramadol Itching and Nausea And Vomiting    Outpatient Encounter Medications as of 10/15/2020  Medication Sig  . albuterol (PROAIR HFA) 108 (90 Base) MCG/ACT inhaler Inhale 2 puffs into the lungs every 6 (six) hours as needed for wheezing or shortness of breath.  [name] albuterol (PROVENTIL) (2.5 MG/3ML) 0.083% nebulizer solution USE 1 VIAL IN NEBULIZER EVERY 6 HOURS AS NEEDED FOR WHEEZING FOR SHORTNESS OF BREATH  . albuterol (VENTOLIN HFA)  108 (90 Base) MCG/ACT inhaler Inhale 1-2 puffs into the lungs every 6 (six) hours as needed for wheezing or shortness of breath.  . Calcium-Phosphorus-Vitamin D (CITRACAL +D3 PO) Take 1 tablet by mouth 2 (two) times a day.  . cetirizine (ZYRTEC ALLERGY) 10 MG tablet Take 1 tablet (10 mg total) by mouth daily.  . diclofenac Sodium (VOLTAREN) 1 % GEL Apply 4 g topically 4 (four) times daily.  . fluticasone (FLONASE) 50 MCG/ACT nasal spray Place 1 spray into both nostrils daily as needed for allergies.  . furosemide (LASIX) 40 MG tablet Take 1 tablet by mouth twice daily  . gabapentin (NEURONTIN) 600 MG tablet Take 1.5 tablets (900 mg total) by mouth 3 (three) times daily. For nerve pain- increased to get her off lyrica  . HYDROcodone-acetaminophen (NORCO) 5-325 MG tablet Take 1 tablet by mouth every 4 (four) hours as needed for severe pain (increased to 5x/day as needed).  [name] lisinopril (ZESTRIL) 20 MG tablet Take 1 tablet by mouth once daily  . metoprolol succinate (TOPROL-XL) 25 MG 24 hr tablet Take 1 tablet (25 mg total) by mouth daily.  . potassium chloride Anthony (KLOR-CON) 20 MEQ tablet Take 2 tablets (40 mEq total) by mouth daily.  . predniSONE (DELTASONE) 10 MG tablet Take 4 tablets (40 mg total) by mouth daily.  . pregabalin (LYRICA) 50 MG capsule Take 1 capsule (50 mg total) by mouth 3 (three) times daily. X 1 week, then 50 mg nightly x 1 week, then stop  . simvastatin (ZOCOR) 20 MG tablet Take 1 tablet by mouth once daily  . Spacer/Aero-Holding Chambers (BREATHERITE COLL SPACER ADULT) MISC  1 applicator by Does not apply route daily.  [name] STIOLTO RESPIMAT 2.5-2.5 MCG/ACT AERS INHALE 2 PUFFS BY MOUTH ONCE DAILY  . warfarin (COUMADIN) 4 MG tablet Take by mouth.  . warfarin (COUMADIN) 5 MG tablet TAKE 1 TABLET BY MOUTH DAILY EXCEPT ON WEDNESDAYS, TAKE 1/2 TABLET ON WEDNESDAYS.   No facility-administered encounter medications on file as of 10/15/2020.    Patient Active Problem List   Diagnosis  Date Noted  . Acute idiopathic gout of multiple sites 09/30/2020  . Right wrist pain 05/29/2020  . Ganglion of right wrist 05/29/2020  . Eye redness 02/15/2020  . Shortness of breath 02/09/2020  . Chronic pain syndrome 01/17/2020  . Monoarthritis of wrist 10/18/2019  . Ganglion cyst 09/21/2019  . Hip pain 08/23/2019  . Thoracic ascending aortic aneurysm ([place]) 07/05/2019  . Pustular rash 03/09/2019  . Anemia 02/16/2019  . Breast cancer ([place])   . Acquired absence of left breast 12/20/2018  . Open wound of axillary region 05/11/2018  . COPD (chronic obstructive pulmonary disease) ([place]) 11/25/2017  . S/P MVR (mitral valve replacement)   . Chronic anticoagulation   . Tobacco use disorder 02/25/2017  . Lymphadenopathy, mediastinal 02/25/2017  . History of mitral valve replacement with mechanical valve 11/18/2016  . Essential hypertension 02/01/2016  . Allergic rhinitis 02/01/2016  . Healthcare maintenance 01/30/2016  . Chronic low back pain 07/22/2015  . Pulmonary embolism ([place]) 07/11/2015  . Chronic combined systolic and diastolic congestive heart failure ([place]) 07/11/2015  . Headache, common migraine 07/11/2015  . HLD (hyperlipidemia) 07/11/2015    Conditions to be addressed/monitored: COPD, Asthma, HF, HTN, HLD, on Coumadin r/t MVR, chronic back pain and right wrist pain,  Care Plan : Chronic Pain (Adult)  Updates made by Barrington Ellison, RN since 10/15/2020 12:00 AM    Problem: Chronic Pain Management (Chronic Pain)     Goal: Chronic Pain Managed- acute on chronic right wrist pain is managed   Start Date: 06/20/2020  Expected End Date: 12/19/2020  Recent Progress: On track  Priority: High  Note:   CARE PLAN ENTRY (see longitudinal plan of care for additional care plan information)  Current Barriers:  [name] Knowledge Deficits related to managing acute/chronic pain- patient states she fell yesterday while she was vacuuming and trying to get to the phone to answer it, says she  has been struggling with left knee pain for several weeks and has been seeing Dr Kim Anthony- orthopedist- frequently and has had fluid drawn off her knee x 2 and she will see him again tomorrow, says her right wrist pain is no better, and follows up with saw Dr Fredna Dow- hand specialist per his direction, says she is using a left knee brace and right wrist brace, reports she is taking her medications as prescribed. She says her back pain is about the same and continues to keep her appointments with pain management MD.   . Chronic Disease Management support and education needs related to chronic pain  Nurse Case Manager Clinical Goal(s):  [name] Over the next 30-60 days, patient will verbalize understanding of plan for managing pain . Over the next 30-60  days, patient will attend all scheduled medical appointments:   Interventions:  . Evaluation of current treatment plan related to right wrist pain and patient's adherence to plan as established by provider. . Advised patient to keep all scheduled appointments including upcoming imaging and hand specialist appointments . Discussed plans with patient for ongoing care management follow up and provided patient with  direct contact information for care management team  Patient Self Care Activities:  . Patient verbalizes understanding of plan to work with providers in treating right wrist pain . Self administers medications as prescribed . Attends all scheduled provider appointments . Calls pharmacy for medication refills . Calls provider office for new concerns or questions .  keep all provider appointments  - use distraction techniques - use relaxation during pain   -wear right wrist brace - contact provider for unrelieved severe pain Follow up Plan: The care management team will reach out to the patient again over the next 30-60 days.    Care Plan : COPD (Adult)  Updates made by Barrington Ellison, RN since 10/15/2020 12:00 AM    Problem: Symptom Exacerbation  (COPD)- call provider when symptoms not improving despite   Priority: High    Long-Range Goal: Symptom Exacerbation Prevented or Minimized to prevent hospitalization or need for emergency treatment   Start Date: 06/20/2020  Expected End Date: 12/19/2020  Recent Progress: On track  Priority: High  Note:   Current Barriers:  [name] Knowledge deficits related to basic COPD self care/management- spoke with patient to complete follow up assessment via phone,  she says she is using her Stiolto everyday, and continues to use her albuterol rescue inhaler and nebulizer prn, she says she has been using them more than usual because the pollen has been so bad,  she says she was told that [facility] will no longer provide monthly visits to her home   Case Manager Clinical Goal(s):  Over the next 30-60 days, patient will not be hospitalized for COPD exacerbation  Interventions:   Advised patient to self assesses COPD action plan zone and make appointment with provider if in the yellow zone for 48 hours without improvement.  Provided education about and advised patient to utilize infection prevention strategies to reduce risk of respiratory infection   Verified with patient that she continues to receive monthly visits from [facility] most recent [facility] home visit note of 07/25/20- patient's BP meeting treatment targets and no c/o asthma or COPD flare requiring ED visit or hospitalization  09/16/20- Advised patient that provider sent the Rx for portable nebulizer sent to [place] on [address] or per Skeet Latch, [employer] representative, portable nebulizers are now in stock at [employer] store in [facility] and patient could purchase one there Patient Goals/Self-Care Activities:  . eliminate symptom triggers at home . - follow rescue plan if symptoms flare-up . - keep follow-up appointments . - use an extra pillow to sleep if needed Follow Up Plan: The care management team  will reach out to the patient again over the next 30-60 days.     Care Plan : CCM RN Hypertension (Adult)  Updates made by Barrington Ellison, RN since 10/15/2020 12:00 AM    Problem: Hypertension (Hypertension)     Long-Range Goal: Hypertension Monitored   This Visit's Progress: Not on track  Priority: High  Note:   BP Readings from Last 3 Encounters:  09/30/20 (!) 170/88  09/26/20 100/72  08/29/20 (!) 152/85     Promote initial use of ambulatory blood pressure measurements (for 3 days) to rule out "white-coat" effect; identify masked hypertension and presence or absence of nocturnal "dipping" of blood pressure.    Since Lisinopril was stopped, encouraged use of home blood pressure monitoring daily and recording on blood pressure log sheet in [facility] spiral bound notebook/day planner;  include symptoms of hypotension or potential medication side effects in log.   Reviewed blood pressure treatment targets  Notes:      Plan: The care management team will reach out to the patient again over the next 30-60 days.  Kelli Churn RN, CCM, [facility] RN Care Manager [phone]

## 2020-10-15 NOTE — Telephone Encounter (Signed)
Is actually due yesterday to pick it up, it appears from filling 3/25 last month- -I sent in 4/11, but I told her it was early and would have to wait til now to pick up- so she should be able to get now- if there's an issue, please let me know by calling me on cell if I need to call pharmacy- thanks, ML

## 2020-10-15 NOTE — Patient Instructions (Signed)
Visit Information It was nice speaking with you today. Goals Addressed              This Visit's Progress     Patient Stated   .  Track and Manage My Symptoms-COPD- "I know when to use my nebulizer machine." (pt-stated)        Timeframe:  Long-Range Goal Priority:  High Start Date:          06/20/20                   Expected End Date:   ongoing                    Follow Up Date 11/12/20   - eliminate symptom triggers at home - follow rescue plan if symptoms flare-up - keep follow-up appointments - use an extra pillow to sleep if needed   Why is this important?    Tracking your symptoms and other information about your health helps your doctor plan your care.   Write down the symptoms, the time of day, what you were doing and what medicine you are taking.   You will soon learn how to manage your symptoms.     Notes: 09/16/20 patient is requesting portable nebulizer that plugs into car cigarette lighter- notified [employer] representative of order written for portable nebulizer on 08/19/20      Other   .  Cope with Pain in right wrist        Timeframe:  Short-Term Goal Priority:  High Start Date:      06/20/20                       Expected End Date:   ongoing                  Follow Up Date 11/12/20   - keep all provider appointments  - use distraction techniques - use relaxation during pain   -wear right wrist brace - contact provider for unrelieved severe pain Why is this important?    Living with back pain and enjoying your life may be hard.   Feelings, such as depression or anger, can make your pain worse.   Learning ways to cope may help you find some relief from the pain.    Notes: 10/15/20- wrist pain still an issue         The patient verbalized understanding of instructions, educational materials, and care plan provided today and declined offer to receive copy of patient instructions, educational materials, and care plan.   The care management team will  reach out to the patient again over the next 30-60 days.   Kelli Churn RN, CCM, [facility] RN Care Manager [phone]

## 2020-10-16 NOTE — Telephone Encounter (Signed)
She never called back. I assume she got her prescription.

## 2020-10-21 ENCOUNTER — Ambulatory Visit: Payer: Medicare Other | Admitting: Physical Medicine and Rehabilitation

## 2020-10-25 ENCOUNTER — Other Ambulatory Visit: Payer: Self-pay | Admitting: Student

## 2020-10-30 ENCOUNTER — Other Ambulatory Visit: Payer: Self-pay | Admitting: *Deleted

## 2020-10-30 DIAGNOSIS — I10 Essential (primary) hypertension: Secondary | ICD-10-CM

## 2020-10-30 DIAGNOSIS — I5042 Chronic combined systolic (congestive) and diastolic (congestive) heart failure: Secondary | ICD-10-CM

## 2020-10-30 MED ORDER — LISINOPRIL 20 MG PO TABS: 1.0000 | ORAL_TABLET | Freq: Every day | ORAL | 0 refills | Status: DC

## 2020-11-05 NOTE — Progress Notes (Deleted)
   CC: ***  HPI:  [name] is a 70 y.o.   Past Medical History:  Diagnosis Date  . Arthritis   . Asthma   . Breast cancer ([place]) 2001   Left Breast Cancer  . CHF (congestive heart failure) ([place])   . COPD (chronic obstructive pulmonary disease) ([place])   . Coronary artery disease   . History of mitral valve replacement with mechanical valve   . HLD (hyperlipidemia)   . Hypertension   . Pulmonary embolism ([place])    Review of Systems:  ***  Physical Exam:  There were no vitals filed for this visit. ***  Assessment & Plan:   See Encounters Tab for problem based charting.  Patient {GC/GE:[id number]::"discussed with","seen with"} Dr. {NAMES:[id number]::"Butcher","Guilloud","Hoffman","Mullen","Narendra","Raines","Vincent"}

## 2020-11-06 ENCOUNTER — Encounter: Payer: Medicare Other | Admitting: Internal Medicine

## 2020-11-06 ENCOUNTER — Ambulatory Visit (HOSPITAL_COMMUNITY): Admission: RE | Admit: 2020-11-06 | Payer: Medicare Other | Source: Ambulatory Visit

## 2020-11-07 ENCOUNTER — Other Ambulatory Visit: Payer: Self-pay | Admitting: Internal Medicine

## 2020-11-07 ENCOUNTER — Other Ambulatory Visit: Payer: Self-pay | Admitting: Student

## 2020-11-07 DIAGNOSIS — J449 Chronic obstructive pulmonary disease, unspecified: Secondary | ICD-10-CM

## 2020-11-07 DIAGNOSIS — R0602 Shortness of breath: Secondary | ICD-10-CM

## 2020-11-12 ENCOUNTER — Ambulatory Visit: Payer: Medicare Other | Admitting: *Deleted

## 2020-11-12 ENCOUNTER — Encounter: Payer: Self-pay | Admitting: Internal Medicine

## 2020-11-12 ENCOUNTER — Other Ambulatory Visit: Payer: Self-pay

## 2020-11-12 ENCOUNTER — Ambulatory Visit (INDEPENDENT_AMBULATORY_CARE_PROVIDER_SITE_OTHER): Payer: Medicare Other | Admitting: Internal Medicine

## 2020-11-12 ENCOUNTER — Telehealth: Payer: Medicare Other

## 2020-11-12 VITALS — BP 128/76 | HR 79 | Temp 98.8°F | Ht 65.0 in | Wt 185.8 lb

## 2020-11-12 DIAGNOSIS — I10 Essential (primary) hypertension: Secondary | ICD-10-CM | POA: Diagnosis not present

## 2020-11-12 DIAGNOSIS — I2699 Other pulmonary embolism without acute cor pulmonale: Secondary | ICD-10-CM

## 2020-11-12 DIAGNOSIS — I5042 Chronic combined systolic (congestive) and diastolic (congestive) heart failure: Secondary | ICD-10-CM

## 2020-11-12 DIAGNOSIS — Z23 Encounter for immunization: Secondary | ICD-10-CM

## 2020-11-12 DIAGNOSIS — M13139 Monoarthritis, not elsewhere classified, unspecified wrist: Secondary | ICD-10-CM

## 2020-11-12 DIAGNOSIS — Z0000 Encounter for general adult medical examination without abnormal findings: Secondary | ICD-10-CM

## 2020-11-12 DIAGNOSIS — J449 Chronic obstructive pulmonary disease, unspecified: Secondary | ICD-10-CM

## 2020-11-12 NOTE — Progress Notes (Signed)
   CC: Wrist pain, HTN  HPI:  [name] is a 70 y.o. with history listed below including combined heart failure, COPD, hypertension, hyperlipidemia, PE, mitral valve repair on warfarin daily, and bilateral hand pain presenting for wrist pain and hypertension.  Past Medical History:  Diagnosis Date  . Arthritis   . Asthma   . Breast cancer ([place]) 2001   Left Breast Cancer  . CHF (congestive heart failure) ([place])   . COPD (chronic obstructive pulmonary disease) ([place])   . Coronary artery disease   . History of mitral valve replacement with mechanical valve   . HLD (hyperlipidemia)   . Hypertension   . Pulmonary embolism ([place])    Review of Systems:   Constitutional: Negative for chills and fever.  Respiratory: Negative for shortness of breath.   Cardiovascular: Negative for chest pain and leg swelling.  Gastrointestinal: Negative for abdominal pain, nausea and vomiting.  MSK: Right wrist pain.  Neurological: Negative for dizziness and headaches.   Physical Exam:  Vitals:   11/12/20 1421  BP: 128/76  Pulse: 79  Temp: 98.8 F (37.1 C)  TempSrc: Oral  SpO2: 96%  Weight: 185 lb 12.8 oz (84.3 kg)  Height: 5\' 5"  (1.651 m)   Physical Exam Constitutional:      Appearance: Normal appearance.  Cardiovascular:     Rate and Rhythm: Normal rate and regular rhythm.     Pulses: Normal pulses.     Heart sounds: Normal heart sounds.  Pulmonary:     Effort: Pulmonary effort is normal.     Breath sounds: Normal breath sounds.  Abdominal:     General: Abdomen is flat. Bowel sounds are normal.     Palpations: Abdomen is soft.  Musculoskeletal:       Hands:     Comments: No tenderness of fingers. TTP over dorsal aspect of hand, 4/5 grip strength of right hand.   Skin:    General: Skin is warm and dry.  Neurological:     General: No focal deficit present.     Mental Status: She is alert and oriented to person, place, and time.  Psychiatric:        Mood and Affect: Mood  normal.        Behavior: Behavior normal.      Assessment & Plan:   See Encounters Tab for problem based charting.  Patient discussed with Dr. [place]

## 2020-11-12 NOTE — Addendum Note (Signed)
Addended by: Barrington Ellison on: 11/12/2020 07:50 PM   Modules accepted: Orders

## 2020-11-12 NOTE — Chronic Care Management (AMB) (Signed)
Care Management    RN Visit Note  11/12/2020 Name: Kim Anthony [name]: [id number] DOB: 02-Nov-1950  Subjective: Kim Anthony is a 70 y.o. year old female who is a primary care patient of Kim Payment, MD. The care management team was consulted for assistance with disease management and care coordination needs.    Engaged with patient face to face for follow up visit in response to provider referral for case management and/or care coordination services.   Consent to Services:   [name] was given information about Care Management services today including:  1. Care Management services includes personalized support from designated clinical staff supervised by her physician, including individualized plan of care and coordination with other care providers 2. 24/7 contact phone numbers for assistance for urgent and routine care needs. 3. The patient may stop case management services at any time by phone call to the office staff.  Patient agreed to services and consent obtained.   Assessment: Review of patient past medical history, allergies, medications, health status, including review of consultants reports, laboratory and other test data, was performed as part of comprehensive evaluation and provision of chronic care management services.   SDOH (Social Determinants of Health) assessments and interventions performed:    Care Plan  Allergies  Allergen Reactions  . Acetaminophen-Codeine Itching  . Propoxyphene Itching    Darvocet  . Tape Rash    Plastic  . Tramadol Itching and Nausea And Vomiting    Outpatient Encounter Medications as of 11/12/2020  Medication Sig  . albuterol (PROAIR HFA) 108 (90 Base) MCG/ACT inhaler Inhale 2 puffs into the lungs every 6 (six) hours as needed for wheezing or shortness of breath.  [name] albuterol (PROVENTIL) (2.5 MG/3ML) 0.083% nebulizer solution USE 1 VIAL IN NEBULIZER EVERY 6 HOURS AS NEEDED FOR WHEEZING FOR SHORTNESS OF BREATH  . albuterol (VENTOLIN HFA)  108 (90 Base) MCG/ACT inhaler Inhale 1-2 puffs into the lungs every 6 (six) hours as needed for wheezing or shortness of breath.  . Calcium-Phosphorus-Vitamin D (CITRACAL +D3 PO) Take 1 tablet by mouth 2 (two) times a day.  . cetirizine (ZYRTEC ALLERGY) 10 MG tablet Take 1 tablet (10 mg total) by mouth daily.  . diclofenac Sodium (VOLTAREN) 1 % GEL Apply 4 g topically 4 (four) times daily.  . fluticasone (FLONASE) 50 MCG/ACT nasal spray Place 1 spray into both nostrils daily as needed for allergies.  . furosemide (LASIX) 40 MG tablet Take 1 tablet by mouth twice daily  . gabapentin (NEURONTIN) 600 MG tablet Take 1.5 tablets (900 mg total) by mouth 3 (three) times daily. For nerve pain- increased to get her off lyrica  . lisinopril (ZESTRIL) 20 MG tablet Take 1 tablet (20 mg total) by mouth daily.  . metoprolol succinate (TOPROL-XL) 25 MG 24 hr tablet Take 1 tablet (25 mg total) by mouth daily.  . potassium chloride SA (KLOR-CON) 20 MEQ tablet Take 2 tablets (40 mEq total) by mouth daily.  . simvastatin (ZOCOR) 20 MG tablet Take 1 tablet by mouth once daily  . Spacer/Aero-Holding Chambers (BREATHERITE COLL SPACER ADULT) MISC 1 applicator by Does not apply route daily.  [name] STIOLTO RESPIMAT 2.5-2.5 MCG/ACT AERS INHALE 2 PUFFS BY MOUTH ONCE DAILY  . warfarin (COUMADIN) 4 MG tablet Take by mouth.  . warfarin (COUMADIN) 5 MG tablet TAKE 1 TABLET BY MOUTH DAILY EXCEPT ON WEDNESDAYS, TAKE 1/2 TABLET ON WEDNESDAYS.   No facility-administered encounter medications on file as of 11/12/2020.    Patient Active Problem  List   Diagnosis Date Noted  . Acute idiopathic gout of multiple sites 09/30/2020  . Eye redness 02/15/2020  . Chronic pain syndrome 01/17/2020  . Monoarthritis of wrist 10/18/2019  . Ganglion cyst 09/21/2019  . Hip pain 08/23/2019  . Thoracic ascending aortic aneurysm ([place]) 07/05/2019  . Pustular rash 03/09/2019  . Anemia 02/16/2019  . Breast cancer ([place])   . Acquired absence of left  breast 12/20/2018  . Open wound of axillary region 05/11/2018  . COPD (chronic obstructive pulmonary disease) ([place]) 11/25/2017  . S/P MVR (mitral valve replacement)   . Chronic anticoagulation   . Tobacco use disorder 02/25/2017  . Lymphadenopathy, mediastinal 02/25/2017  . History of mitral valve replacement with mechanical valve 11/18/2016  . Essential hypertension 02/01/2016  . Allergic rhinitis 02/01/2016  . Healthcare maintenance 01/30/2016  . Chronic low back pain 07/22/2015  . Pulmonary embolism ([place]) 07/11/2015  . Chronic combined systolic and diastolic congestive heart failure ([place]) 07/11/2015  . Headache, common migraine 07/11/2015  . HLD (hyperlipidemia) 07/11/2015    Conditions to be addressed/monitored: COPD, Asthma, HF, HTN, HLD, on Coumadin r/t MVR, chronic back pain and right wrist pain,    Care Plan : Chronic Pain (Adult)  Updates made by Barrington Ellison, RN since 11/12/2020 12:00 AM    Problem: Chronic Pain Management (Chronic Pain)     Goal: Chronic Pain Managed- acute on chronic right wrist pain is managed   Start Date: 06/20/2020  Expected End Date: 12/19/2020  Recent Progress: On track  Priority: High  Note:   CARE PLAN ENTRY (see longitudinal plan of care for additional care plan information)  Current Barriers:  [name] Knowledge Deficits related to managing acute/chronic pain- met with patient during her clinic visit on 11/12/20, states she is still struggling with right wrist and knee pain, she says she continues to wear her right wrist brace, she reports her hand MD wants to refer her to a rheumatologist, denies any more falls since last month and she would use a personal emergency response system if her insurance covers the cost, reports she is taking her medications as prescribed. She says her back pain is about the same and continues to keep her appointments with pain management MD.   . Chronic Disease Management support and education needs related to chronic  pain  Nurse Case Manager Clinical Goal(s):  [name] Over the next 30-60 days, patient will verbalize understanding of plan for managing pain . Over the next 30-60  days, patient will attend all scheduled medical appointments:   Interventions:  . Evaluation of current treatment plan related to right wrist pain and patient's adherence to plan as established by provider. . Advised patient to keep all scheduled appointments including upcoming imaging and hand specialist appointments . Referred to community care guide for assistance in securing personal emergency response resources . Discussed plans with patient for ongoing care management follow up and provided patient with direct contact information for care management team  Patient Self Care Activities:  . Patient verbalizes understanding of plan to work with providers in treating right wrist pain . Self administers medications as prescribed . Attends all scheduled provider appointments . Calls pharmacy for medication refills . Calls provider office for new concerns or questions .  keep all provider appointments  - use distraction techniques - use relaxation during pain   -wear right wrist brace - contact provider for unrelieved severe pain Follow up Plan: The care management team will reach out to the patient  again over the next 30-60 days.    Care Plan : COPD (Adult)  Updates made by Barrington Ellison, RN since 11/12/2020 12:00 AM    Problem: Symptom Exacerbation (COPD)- call provider when symptoms not improving despite   Priority: High    Long-Range Goal: Symptom Exacerbation Prevented or Minimized to prevent hospitalization or need for emergency treatment   Start Date: 06/20/2020  Expected End Date: 12/19/2020  Recent Progress: On track  Priority: High  Note:   Current Barriers:  [name] Knowledge deficits related to basic COPD self care/management- met with patient in clinic to complete follow up assessment,  she says she is using her  Stiolto everyday, and continues to use her albuterol rescue inhaler and nebulizer prn, she says she uses them more than usual when the pollen count is high, says she is still interested in getting a portable nebulizer that will plug into her car's cigarette lighter    Case Manager Clinical Goal(s):  Over the next 30-60 days, patient will not be hospitalized for COPD exacerbation  Interventions:   Advised patient to self assesses COPD action plan zone and make appointment with provider if in the yellow zone for 48 hours without improvement.  Provided education about and advised patient to utilize infection prevention strategies to reduce risk of respiratory infection   09/16/20- Advised patient that provider sent the Rx for portable nebulizer sent to [facility] or per Skeet Latch, [employer] representative, portable nebulizers are now in stock at [facility] and patient could purchase one there  11/12/20- advised patient to call this CCM RN with the phone number of the portable nebulizer company when she sees the TV ad Patient Goals/Self-Care Activities:  . eliminate symptom triggers at home . - follow rescue plan if symptoms flare-up . - keep follow-up appointments . - use an extra pillow to sleep if needed Follow Up Plan: The care management team will reach out to the patient again over the next 30-60 days.     Care Plan : CCM RN Hypertension (Adult)  Updates made by Barrington Ellison, RN since 11/12/2020 12:00 AM    Problem: Hypertension (Hypertension)     Long-Range Goal: Hypertension Monitored   This Visit's Progress: On track  Recent Progress: Not on track  Priority: High  Note:   BP Readings from Last 3 Encounters:  11/12/20 128/76  09/30/20 (!) 170/88  09/26/20 100/72      Promote initial use of ambulatory blood pressure measurements (for 3 days) to rule out "white-coat" effect; identify masked hypertension and presence or  absence of nocturnal "dipping" of blood pressure.    Encouraged use of home blood pressure monitoring daily and recording on blood pressure log sheet in [facility] spiral bound notebook/day planner; include symptoms of hypotension or potential medication side effects in log.   Reviewed blood pressure treatment targets  Notes: BP readings meeting treatment targets while today in clinic on 11/12/20     Plan: The care management team will reach out to the patient again over the next 30-60 days.  Kelli Churn RN, CCM, [facility] RN Care Manager [phone]

## 2020-11-12 NOTE — Patient Instructions (Addendum)
[name],  It was a pleasure to see you today. Thank you for coming in.   Today we discussed your wrist pain. In regards to this please continue wearing the brace that you have. Please follow up with orthopedics regarding this.   We also discussed your blood pressure. This looks good today. Please continue your current medications. Please try to have the echocardiogram scheduled.   You received the pneumonia vaccine today.   Please return to clinic in 3 months or sooner if needed.   Thank you again for coming in.   Asencion Noble.D.

## 2020-11-12 NOTE — Patient Instructions (Signed)
Visit Information It was nice speaking with you today. Goals Addressed              This Visit's Progress     Patient Stated   .  Track and Manage My Symptoms-COPD- "I know when to use my nebulizer machine." (pt-stated)        Timeframe:  Long-Range Goal Priority:  High Start Date:          06/20/20                   Expected End Date:   ongoing                    Follow Up Date 12/19/20   - eliminate symptom triggers at home - follow rescue plan if symptoms flare-up - keep follow-up appointments - use an extra pillow to sleep if needed   Why is this important?    Tracking your symptoms and other information about your health helps your doctor plan your care.   Write down the symptoms, the time of day, what you were doing and what medicine you are taking.   You will soon learn how to manage your symptoms.     Notes: Patient is requesting portable nebulizer that plugs into car cigarette lighter- notified [employer] representative of order written for portable nebulizer on 08/19/20 but [employer] does not have so asked patient to call this CCM RN with the phone number of the portable nebulizer supplier when she sees the TV ad      Other   .  Cope with Pain in right wrist        Timeframe:  Short-Term Goal Priority:  High Start Date:      06/20/20                       Expected End Date:   ongoing                  Follow Up Date 12/19/20   - keep all provider appointments  - use distraction techniques - use relaxation during pain   -wear right wrist brace - contact provider for unrelieved severe pain Why is this important?    Living with back pain and enjoying your life may be hard.   Feelings, such as depression or anger, can make your pain worse.   Learning ways to cope may help you find some relief from the pain.    Notes: 10/2420- wrist pain still an issue  - she will be referred to a rheumatologist       The patient verbalized understanding of instructions,  educational materials, and care plan provided today and declined offer to receive copy of patient instructions, educational materials, and care plan.   The care management team will reach out to the patient again over the next 30-60 days.   Kelli Churn RN, CCM, [facility] RN Care Manager [phone]

## 2020-11-13 NOTE — Assessment & Plan Note (Signed)
Patient is currently on lisinopril 20 mg daily, Toprol 25 mg daily, Lasix 40 mg twice daily, and potassium supplements 20 mEq daily.  During her last appointment in 08/28/2020 she had some worsening wheezing and shortness of breath with exertion.  She had a BNP that was elevated at 277.  There is concern of worsening heart failure and an echocardiogram was ordered, she did not show up for the echocardiogram.  She states that she had to have this rescheduled.  She states that her shortness of breath and wheezing has improved.  -Continue current medications -F/u on echocardiogram

## 2020-11-13 NOTE — Assessment & Plan Note (Signed)
>>  ASSESSMENT AND PLAN FOR CHRONIC HFREF (HEART FAILURE WITH REDUCED EJECTION FRACTION) (HCC) WRITTEN ON 09/14/2023 11:43 AM BY Althia Egolf, DO   >>ASSESSMENT AND PLAN FOR CHF (CONGESTIVE HEART FAILURE) (HCC) WRITTEN ON 09/14/2023 11:43 AM BY Shayne Deerman, DO   >>ASSESSMENT AND PLAN FOR BIVENTRICULAR HEART FAILURE WITH REDUCED LEFT VENTRICULAR FUNCTION (HCC) WRITTEN ON 11/13/2020  8:34 AM BY Claudean Severance, MD  Patient is currently on lisinopril 20 mg daily, Toprol 25 mg daily, Lasix 40 mg twice daily, and potassium supplements 20 mEq daily.  During her last appointment in 08/28/2020 she had some worsening wheezing and shortness of breath with exertion.  She had a BNP that was elevated at 277.  There is concern of worsening heart failure and an echocardiogram was ordered, she did not show up for the echocardiogram.  She states that she had to have this rescheduled.  She states that her shortness of breath and wheezing has improved.  -Continue current medications -F/u on echocardiogram

## 2020-11-13 NOTE — Assessment & Plan Note (Signed)
>>  ASSESSMENT AND PLAN FOR HFREF (HEART FAILURE WITH REDUCED EJECTION FRACTION) (HCC) WRITTEN ON 09/14/2023 11:44 AM BY Kina Shiffman, DO   >>ASSESSMENT AND PLAN FOR CHRONIC HFREF (HEART FAILURE WITH REDUCED EJECTION FRACTION) (HCC) WRITTEN ON 09/14/2023 11:43 AM BY Effie Wahlert, DO   >>ASSESSMENT AND PLAN FOR CHF (CONGESTIVE HEART FAILURE) (HCC) WRITTEN ON 09/14/2023 11:43 AM BY Nicola Heinemann, DO   >>ASSESSMENT AND PLAN FOR BIVENTRICULAR HEART FAILURE WITH REDUCED LEFT VENTRICULAR FUNCTION (HCC) WRITTEN ON 11/13/2020  8:34 AM BY Claudean Severance, MD  Patient is currently on lisinopril 20 mg daily, Toprol 25 mg daily, Lasix 40 mg twice daily, and potassium supplements 20 mEq daily.  During her last appointment in 08/28/2020 she had some worsening wheezing and shortness of breath with exertion.  She had a BNP that was elevated at 277.  There is concern of worsening heart failure and an echocardiogram was ordered, she did not show up for the echocardiogram.  She states that she had to have this rescheduled.  She states that her shortness of breath and wheezing has improved.  -Continue current medications -F/u on echocardiogram

## 2020-11-13 NOTE — Assessment & Plan Note (Signed)
>>  ASSESSMENT AND PLAN FOR BIVENTRICULAR HEART FAILURE WITH REDUCED LEFT VENTRICULAR FUNCTION (HCC) WRITTEN ON 11/13/2020  8:34 AM BY Claudean Severance, MD  Patient is currently on lisinopril 20 mg daily, Toprol 25 mg daily, Lasix 40 mg twice daily, and potassium supplements 20 mEq daily.  During her last appointment in 08/28/2020 she had some worsening wheezing and shortness of breath with exertion.  She had a BNP that was elevated at 277.  There is concern of worsening heart failure and an echocardiogram was ordered, she did not show up for the echocardiogram.  She states that she had to have this rescheduled.  She states that her shortness of breath and wheezing has improved.  -Continue current medications -F/u on echocardiogram

## 2020-11-13 NOTE — Assessment & Plan Note (Signed)
Patient has a history of right wrist pain she has been seeing orthopedics for.  She has been using a splint and was on a steroid dose pack, she reports that she finished the course on Sunday and yesterday she started having pain.  She states that is located over the dorsal aspect of her right wrist, with the large cyst noted area, she endorses some mild decreased grip strength and difficulty opening jars.  She reports that the splint does help. Work-up including CCP, ANA were negative, ESR and CRP are elevated 82 and 39.  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 On exam she has tenderness palpation over the dorsal aspect of right wrist, with a cyst noted, no erythema, warmth, rash or open wounds noted.  During the last orthopedic note they have recommended follow-up in 7 to 10 days, obtain labs, consideration of rheumatology referral.  Patient may need arthrocentesis to further clarify diagnosis.  Will obtain uric acid which they had ordered, will advised to follow-up with orthopedics. -Continue wrist splint -Follow-up orthopedics -Obtain uric acid -Continue Tylenol and warm compresses as needed

## 2020-11-13 NOTE — Progress Notes (Signed)
Internal Medicine Clinic Attending ° °Case discussed with Dr. Krienke  At the time of the visit.  We reviewed the resident’s history and exam and pertinent patient test results.  I agree with the assessment, diagnosis, and plan of care documented in the resident’s note.  °

## 2020-11-13 NOTE — Assessment & Plan Note (Signed)
Patient received pneumonia (PPV-23) vaccine today.

## 2020-11-13 NOTE — Assessment & Plan Note (Signed)
Patient is currently on lisinopril 20 mg daily, Toprol 25 mg daily, Lasix 40 mg twice daily, and potassium supplements 20 mEq daily.  Blood pressure today is well controlled at 128/76.  She denies any issues taking her medications.  She had a BMP on 3/7 that showed normal electrolytes and kidney function.   -Continue current medications

## 2020-11-13 NOTE — Assessment & Plan Note (Signed)
>>  ASSESSMENT AND PLAN FOR CHF (CONGESTIVE HEART FAILURE) (HCC) WRITTEN ON 09/14/2023 11:43 AM BY Emmali Karow, DO   >>ASSESSMENT AND PLAN FOR BIVENTRICULAR HEART FAILURE WITH REDUCED LEFT VENTRICULAR FUNCTION (HCC) WRITTEN ON 11/13/2020  8:34 AM BY Claudean Severance, MD  Patient is currently on lisinopril 20 mg daily, Toprol 25 mg daily, Lasix 40 mg twice daily, and potassium supplements 20 mEq daily.  During her last appointment in 08/28/2020 she had some worsening wheezing and shortness of breath with exertion.  She had a BNP that was elevated at 277.  There is concern of worsening heart failure and an echocardiogram was ordered, she did not show up for the echocardiogram.  She states that she had to have this rescheduled.  She states that her shortness of breath and wheezing has improved.  -Continue current medications -F/u on echocardiogram

## 2020-11-14 ENCOUNTER — Other Ambulatory Visit: Payer: Self-pay | Admitting: Pharmacist

## 2020-11-14 ENCOUNTER — Telehealth: Payer: Self-pay | Admitting: *Deleted

## 2020-11-14 NOTE — Telephone Encounter (Signed)
Ms Navejas called to get a refill on her hydrocodone.  Last fill date was 10/15/20 and next appt is 01/08/21.

## 2020-11-15 ENCOUNTER — Telehealth: Payer: Self-pay | Admitting: Internal Medicine

## 2020-11-15 MED ORDER — HYDROCODONE-ACETAMINOPHEN 5-325 MG PO TABS: 1.0000 | ORAL_TABLET | ORAL | 0 refills | Status: DC | PRN

## 2020-11-15 NOTE — Telephone Encounter (Signed)
Pt calling multiple times to get Norco refilled- it was last filled 4/26- is due to be refilled- however please remind pt to not wait until last day to call- thank you-  I refilled #150 5/325 mg Norco

## 2020-11-15 NOTE — Telephone Encounter (Signed)
   Telephone encounter was:  Successful.  11/15/2020 Name: Brayley Mackowiak [name]: [id number] DOB: 1951-05-01  Quinette Hentges is a 70 y.o. year old female who is a primary care patient of Marianna Payment, MD . The community resource team was consulted for assistance with Home Modifications  Care guide performed the following interventions: Patient provided with information about care guide support team and interviewed to confirm resource needs Discussed resources to assist with getting a home monitoring system to help when she is home alone. Spoke to daughter [name] and explained that I will do research to see if there is a program in [place] and see if it can be free monthly. .  Follow Up Plan:  Care guide will follow up with patient by phone over the next week  [place], Care Management Phone: [phone] Email: [email or web address]

## 2020-11-15 NOTE — Telephone Encounter (Signed)
Notified. 

## 2020-11-20 ENCOUNTER — Encounter: Payer: Self-pay | Admitting: Internal Medicine

## 2020-11-22 ENCOUNTER — Telehealth: Payer: Self-pay | Admitting: Internal Medicine

## 2020-11-22 NOTE — Telephone Encounter (Signed)
   Telephone encounter was:  Successful.  11/22/2020 Name: [name] [name]: [id number] DOB: 1950-07-27  Kim Anthony is a 69 y.o. year old female who is a primary care patient of Marianna Payment, MD . The community resource team was consulted for assistance with in home alert system  Care guide performed the following interventions: Follow up call placed to the patient to discuss status of referral Spoke to pt's daughter [name] and explained that her mother's [employer] will pay for a in hom monitoring device, monthly bill as well as the device itself. Emailed her the information to make the call and get her mother set up with the program. .  Follow Up Plan:  Client will call the company and get the alert bracelet set up for her mother.  and No further follow up planned at this time. The patient has been provided with needed resources.  [place], Care Management Phone: [phone] Email: [email or web address]

## 2020-11-24 ENCOUNTER — Other Ambulatory Visit: Payer: Self-pay | Admitting: Internal Medicine

## 2020-11-24 DIAGNOSIS — J449 Chronic obstructive pulmonary disease, unspecified: Secondary | ICD-10-CM

## 2020-11-30 ENCOUNTER — Emergency Department (HOSPITAL_BASED_OUTPATIENT_CLINIC_OR_DEPARTMENT_OTHER): Payer: Medicare Other

## 2020-11-30 ENCOUNTER — Other Ambulatory Visit: Payer: Self-pay

## 2020-11-30 ENCOUNTER — Emergency Department (HOSPITAL_COMMUNITY)
Admission: EM | Admit: 2020-11-30 | Discharge: 2020-11-30 | Disposition: A | Discharge status: Home / Self Care | Payer: Medicare Other | Attending: Emergency Medicine | Admitting: Emergency Medicine

## 2020-11-30 ENCOUNTER — Encounter (HOSPITAL_COMMUNITY): Payer: Self-pay | Admitting: Emergency Medicine

## 2020-11-30 DIAGNOSIS — Z7951 Long term (current) use of inhaled steroids: Secondary | ICD-10-CM | POA: Insufficient documentation

## 2020-11-30 DIAGNOSIS — F17210 Nicotine dependence, cigarettes, uncomplicated: Secondary | ICD-10-CM | POA: Insufficient documentation

## 2020-11-30 DIAGNOSIS — M79641 Pain in right hand: Secondary | ICD-10-CM | POA: Diagnosis not present

## 2020-11-30 DIAGNOSIS — I5042 Chronic combined systolic (congestive) and diastolic (congestive) heart failure: Secondary | ICD-10-CM | POA: Insufficient documentation

## 2020-11-30 DIAGNOSIS — Z79899 Other long term (current) drug therapy: Secondary | ICD-10-CM | POA: Insufficient documentation

## 2020-11-30 DIAGNOSIS — Z86718 Personal history of other venous thrombosis and embolism: Secondary | ICD-10-CM | POA: Diagnosis not present

## 2020-11-30 DIAGNOSIS — I2510 Atherosclerotic heart disease of native coronary artery without angina pectoris: Secondary | ICD-10-CM | POA: Diagnosis not present

## 2020-11-30 DIAGNOSIS — M7989 Other specified soft tissue disorders: Secondary | ICD-10-CM | POA: Diagnosis not present

## 2020-11-30 DIAGNOSIS — J449 Chronic obstructive pulmonary disease, unspecified: Secondary | ICD-10-CM | POA: Diagnosis not present

## 2020-11-30 DIAGNOSIS — J45909 Unspecified asthma, uncomplicated: Secondary | ICD-10-CM | POA: Diagnosis not present

## 2020-11-30 DIAGNOSIS — Z7901 Long term (current) use of anticoagulants: Secondary | ICD-10-CM | POA: Diagnosis not present

## 2020-11-30 DIAGNOSIS — Z853 Personal history of malignant neoplasm of breast: Secondary | ICD-10-CM | POA: Diagnosis not present

## 2020-11-30 DIAGNOSIS — I110 Hypertensive heart disease with heart failure: Secondary | ICD-10-CM | POA: Diagnosis not present

## 2020-11-30 MED ORDER — OXYCODONE-ACETAMINOPHEN 5-325 MG PO TABS
1.0000 | ORAL_TABLET | Freq: Once | ORAL | Status: DC
  Filled 2020-11-30: qty 1

## 2020-11-30 NOTE — ED Notes (Signed)
I attempted to get pt's bw and was unsuccessful. I notified phlebotomy who said they will try.

## 2020-11-30 NOTE — ED Triage Notes (Signed)
Pt endorses right hand swelling since Wednesday. States she has seen a hand specialist and negative xrays. Hx of cyst to that wrist.

## 2020-11-30 NOTE — Progress Notes (Signed)
Upper extremity venous RT study completed.  Preliminary results relayed to Betsey Holiday, MD.   See CV Proc for preliminary results report.   Darlin Coco, RDMS, RVT

## 2020-11-30 NOTE — ED Notes (Signed)
I tried to draw patient blood, I didn't have any success.

## 2020-11-30 NOTE — ED Provider Notes (Signed)
[facility] EMERGENCY DEPARTMENT Provider Note   CSN: [id number] Arrival date & time: 11/30/20  0855     History Chief Complaint  Patient presents with   Hand Pain    Kim Anthony is a 70 y.o. female.  Patient presents to the emergency department for evaluation of hand pain and swelling.  Patient has been seen by orthopedic surgery multiple times for this problem.  She reports that it is not getting any better.  Patient denies any direct trauma.      Past Medical History:  Diagnosis Date   Arthritis    Asthma    Breast cancer ([place]) 2001   Left Breast Cancer   CHF (congestive heart failure) (HCC)    COPD (chronic obstructive pulmonary disease) ([place])    Coronary artery disease    History of mitral valve replacement with mechanical valve    HLD (hyperlipidemia)    Hypertension    Pulmonary embolism [facility])     Patient Active Problem List   Diagnosis Date Noted   Acute idiopathic gout of multiple sites 09/30/2020   Eye redness 02/15/2020   Chronic pain syndrome 01/17/2020   Monoarthritis of wrist 10/18/2019   Ganglion cyst 09/21/2019   Hip pain 08/23/2019   Thoracic ascending aortic aneurysm ([place]) 07/05/2019   Pustular rash 03/09/2019   Anemia 02/16/2019   Breast cancer ([place])    Acquired absence of left breast 12/20/2018   Open wound of axillary region 05/11/2018   COPD (chronic obstructive pulmonary disease) ([place]) 11/25/2017   S/P MVR (mitral valve replacement)    Chronic anticoagulation    Tobacco use disorder 02/25/2017   Lymphadenopathy, mediastinal 02/25/2017   History of mitral valve replacement with mechanical valve 11/18/2016   Essential hypertension 02/01/2016   Allergic rhinitis 02/01/2016   Healthcare maintenance 01/30/2016   Chronic low back pain 07/22/2015   Pulmonary embolism ([place]) 07/11/2015   Chronic combined systolic and diastolic congestive heart failure ([place]) 07/11/2015   Headache, common migraine 07/11/2015   HLD  (hyperlipidemia) 07/11/2015    Past Surgical History:  Procedure Laterality Date   APPLICATION OF A-CELL OF CHEST/ABDOMEN Left 01/27/2019   Procedure: APPLICATION OF A-CELL OF CHEST;  Surgeon: Wallace Going, DO;  Location: [place];  Service: Plastics;  Laterality: Left;   APPLICATION OF WOUND VAC N/A 01/27/2019   Procedure: APPLICATION OF WOUND VAC;  Surgeon: Wallace Going, DO;  Location: [place];  Service: Plastics;  Laterality: N/A;   COLONOSCOPY WITH PROPOFOL N/A 06/07/2018   Procedure: COLONOSCOPY WITH PROPOFOL;  Surgeon: Wilford Corner, MD;  Location: [facility];  Service: Endoscopy;  Laterality: N/A;   DEBRIDEMENT AND CLOSURE WOUND Left 12/28/2018   Procedure: Debridement And Closure Wound;  Surgeon: Coralie Keens, MD;  Location: [place];  Service: General;  Laterality: Left;   INCISION AND DRAINAGE OF WOUND Left 01/27/2019   Procedure: IRRIGATION AND DEBRIDEMENT OF CHEST WALL;  Surgeon: Wallace Going, DO;  Location: [place];  Service: Plastics;  Laterality: Left;   LATISSIMUS FLAP TO BREAST Left 01/18/2019   Procedure: LEFT LATISSIMUS FLAP TO BREAST;  Surgeon: Wallace Going, DO;  Location: [place];  Service: Plastics;  Laterality: Left;   MASTECTOMY Left 2000s   for breast cancer, also s/p radiation and chemo   MITRAL VALVE REPLACEMENT     mechanical   POLYPECTOMY  06/07/2018   Procedure: POLYPECTOMY;  Surgeon: Wilford Corner, MD;  Location: [facility];  Service: Endoscopy;;   WOUND DEBRIDEMENT Left 12/28/2018  Procedure: EXCISION OF LEFT CHRONIC CHEST WALL WOUND;  Surgeon: Coralie Keens, MD;  Location: [place];  Service: General;  Laterality: Left;     OB History     Gravida  2   Para  2   Term  2   Preterm      AB      Living         SAB      IAB      Ectopic      Multiple      Live Births  2           Family History  Problem Relation Age of Onset   Heart attack Other    Breast cancer Maternal Aunt 50    Social History    Tobacco Use   Smoking status: Every Day    Packs/day: 0.25    Years: 30.00    Pack years: 7.50    Types: Cigarettes   Smokeless tobacco: Never   Tobacco comments:    Sometimes less. 4 per day  Vaping Use   Vaping Use: Never used  Substance Use Topics   Alcohol use: Yes    Comment: beer- ocasional   Drug use: No    Home Medications Prior to Admission medications   Medication Sig Start Date End Date Taking? Authorizing Provider  albuterol (PROAIR HFA) 108 (90 Base) MCG/ACT inhaler Inhale 2 puffs into the lungs every 6 (six) hours as needed for wheezing or shortness of breath. 09/26/20 10/26/20  Marianna Payment, MD  albuterol (PROVENTIL) (2.5 MG/3ML) 0.083% nebulizer solution USE 1 VIAL IN NEBULIZER EVERY 6 HOURS AS NEEDED FOR WHEEZING FOR SHORTNESS OF BREATH 11/27/20   Cato Mulligan, MD  albuterol (VENTOLIN HFA) 108 (90 Base) MCG/ACT inhaler Inhale 1-2 puffs into the lungs every 6 (six) hours as needed for wheezing or shortness of breath. 08/26/20   Seawell, Jaimie A, DO  Calcium-Phosphorus-Vitamin D (CITRACAL +D3 PO) Take 1 tablet by mouth 2 (two) times a day.    [provider]  cetirizine (ZYRTEC ALLERGY) 10 MG tablet Take 1 tablet (10 mg total) by mouth daily. 08/26/20 08/26/21  Seawell, Jaimie A, DO  diclofenac Sodium (VOLTAREN) 1 % GEL Apply 4 g topically 4 (four) times daily. 07/17/20   Marianna Payment, MD  fluticasone (FLONASE) 50 MCG/ACT nasal spray Place 1 spray into both nostrils daily as needed for allergies. 08/26/20 08/26/21  Seawell, Jaimie A, DO  furosemide (LASIX) 40 MG tablet Take 1 tablet by mouth twice daily 11/08/20   Asencion Noble, MD  gabapentin (NEURONTIN) 600 MG tablet Take 1.5 tablets (900 mg total) by mouth 3 (three) times daily. For nerve pain- increased to get her off lyrica 09/30/20   Lovorn, Jinny Blossom, MD  HYDROcodone-acetaminophen (NORCO) 5-325 MG tablet Take 1 tablet by mouth every 4 (four) hours as needed for moderate pain. 11/15/20   Lovorn, Jinny Blossom, MD   lisinopril (ZESTRIL) 20 MG tablet Take 1 tablet (20 mg total) by mouth daily. 10/30/20   Asencion Noble, MD  metoprolol succinate (TOPROL-XL) 25 MG 24 hr tablet Take 1 tablet (25 mg total) by mouth daily. 02/22/20   Cato Mulligan, MD  potassium chloride SA (KLOR-CON) 20 MEQ tablet Take 2 tablets (40 mEq total) by mouth daily. 08/26/20   Seawell, Jaimie A, DO  simvastatin (ZOCOR) 20 MG tablet Take 1 tablet by mouth once daily 10/28/20   Asencion Noble, MD  Spacer/Aero-Holding Chambers (BREATHERITE COLL SPACER ADULT) MISC 1 applicator by  Does not apply route daily. 01/11/20   [place], Carley D, DO  STIOLTO RESPIMAT 2.5-2.5 MCG/ACT AERS INHALE 2 PUFFS BY MOUTH ONCE DAILY 11/11/20   Virl Axe, MD  warfarin (COUMADIN) 4 MG tablet Take by mouth.    [provider]  warfarin (COUMADIN) 5 MG tablet TAKE 1 TABLET BY MOUTH ONCE DAILY EXCEPT  ON  WEDNESDAYS,  TAKE  1/2  TABLET 11/15/20   Asencion Noble, MD    Allergies    Acetaminophen-codeine, Propoxyphene, Tape, and Tramadol  Review of Systems   Review of Systems  Constitutional:  Negative for fever.  Musculoskeletal:  Positive for arthralgias.  All other systems reviewed and are negative.  Physical Exam Updated Vital Signs BP 120/69   Pulse 75   Temp 99.2 F (37.3 C) (Oral)   Resp 18   Ht 5\' 5"  (1.651 m)   Wt 84.3 kg   SpO2 94%   BMI 30.92 kg/m   Physical Exam Vitals and nursing note reviewed.  Constitutional:      General: She is not in acute distress.    Appearance: Normal appearance. She is well-developed.  HENT:     Head: Normocephalic and atraumatic.     Right Ear: Hearing normal.     Left Ear: Hearing normal.     Nose: Nose normal.  Eyes:     Conjunctiva/sclera: Conjunctivae normal.     Pupils: Pupils are equal, round, and reactive to light.  Cardiovascular:     Rate and Rhythm: Regular rhythm.     Heart sounds: S1 normal and S2 normal. No murmur heard.   No friction rub. No gallop.  Pulmonary:      Effort: Pulmonary effort is normal. No respiratory distress.     Breath sounds: Normal breath sounds.  Chest:     Chest wall: No tenderness.  Abdominal:     General: Bowel sounds are normal.     Palpations: Abdomen is soft.     Tenderness: There is no abdominal tenderness. There is no guarding or rebound. Negative signs include Murphy's sign and McBurney's sign.     Hernia: No hernia is present.  Musculoskeletal:        General: Swelling present. Normal range of motion.     Right hand: Swelling (dorsal 2nd metacarpal region) and tenderness present.     Cervical back: Normal range of motion and neck supple.  Skin:    General: Skin is warm and dry.     Findings: No rash.  Neurological:     Mental Status: She is alert and oriented to person, place, and time.     GCS: GCS eye subscore is 4. GCS verbal subscore is 5. GCS motor subscore is 6.     Cranial Nerves: No cranial nerve deficit.     Sensory: No sensory deficit.     Coordination: Coordination normal.  Psychiatric:        Speech: Speech normal.        Behavior: Behavior normal.        Thought Content: Thought content normal.    ED Results / Procedures / Treatments   Labs (all labs ordered are listed, but only abnormal results are displayed) Labs Reviewed  CBC  BASIC METABOLIC PANEL  PROTIME-INR    EKG None  Radiology VAS [place] UPPER EXTREMITY VENOUS DUPLEX  Result Date: 11/30/2020 UPPER VENOUS STUDY  Patient Name:  SYA NESTLER  Date of Exam:   11/30/2020 Medical Rec #: [id number]    Accession #:  [id number] Date of Birth: [birth year]     Patient Gender: F Patient Age:   73Y Exam Location:  [facility] Procedure:      VAS [place] UPPER EXTREMITY VENOUS DUPLEX Referring Phys: [id number] [place] --------------------------------------------------------------------------------  Indications: Swelling of RT hand/wrist. Hiistory of DVT/PE. Anticoagulation: Coumadin. Limitations: Patient intolerance to compressions at  forearm secondary to pain. Comparison Study: 01-21-2019 Most recent prior upper extremity venous LEFT was                   negative for DVT. Performing Technologist: Darlin Coco RDMS,RVT  Examination Guidelines: A complete evaluation includes B-mode imaging, spectral Doppler, color Doppler, and power Doppler as needed of all accessible portions of each vessel. Bilateral testing is considered an integral part of a complete examination. Limited examinations for reoccurring indications may be performed as noted.  Right Findings: +----------+------------+---------+-----------+----------+-------+ RIGHT     CompressiblePhasicitySpontaneousPropertiesSummary +----------+------------+---------+-----------+----------+-------+ IJV           Full       Yes       Yes                      +----------+------------+---------+-----------+----------+-------+ Subclavian    Full       Yes       Yes                      +----------+------------+---------+-----------+----------+-------+ Axillary      Full       Yes       Yes                      +----------+------------+---------+-----------+----------+-------+ Brachial      Full                                          +----------+------------+---------+-----------+----------+-------+ Radial        Full                                          +----------+------------+---------+-----------+----------+-------+ Ulnar         Full                                          +----------+------------+---------+-----------+----------+-------+ Cephalic      Full                                          +----------+------------+---------+-----------+----------+-------+ Basilic       Full                                          +----------+------------+---------+-----------+----------+-------+  Left Findings: +----------+------------+---------+-----------+----------+-------+ LEFT       CompressiblePhasicitySpontaneousPropertiesSummary +----------+------------+---------+-----------+----------+-------+ Subclavian               Yes       Yes                      +----------+------------+---------+-----------+----------+-------+  Summary:  Right: No evidence of deep vein thrombosis in the upper extremity. No evidence of superficial vein thrombosis in the upper extremity.  Left: No evidence of thrombosis in the subclavian.  *See table(s) above for measurements and observations.     Preliminary     Procedures Procedures   Medications Ordered in ED Medications - No data to display  ED Course  I have reviewed the triage vital signs and the nursing notes.  Pertinent labs & imaging results that were available during my care of the patient were reviewed by me and considered in my medical decision making (see chart for details).    MDM Rules/Calculators/A&P                          Presents with pain and swelling of the right hand.  Patient has swelling around the area of the wrist and radial aspect of the dorsal part of the right hand.  Reviewing records she has been seen in orthopedics multiple times for this problem, it has been ongoing for several months.  Reports that she is not getting any better.  There is not any worsening of her situation.  No overlying erythema or induration.  No warmth over the joints.  Patient has range of motion of the joints as well.  This does not look suspicious for a septic arthritis.  Multiple attempts to draw blood were unsuccessful but she did undergo venous duplex and there is no evidence of DVT.  She has had a DVT in the past.  She does have palpable pulses, normal capillary refill.  No concern for arterial emboli.  Review of her work-up includes a CT scan that raised the question of dorsal intersection syndrome.  She is tender in this area.  At this point this appears to be a chronic problem and she needs to follow-up with Dr. Fredna Dow.   Patient administered analgesia here in the department.  Review of PMP aware reveals that she was filled a prescription for 150 Vicodin 2 weeks ago.  No new prescription.    Final Clinical Impression(s) / ED Diagnoses Final diagnoses:  Right hand pain    Rx / DC Orders ED Discharge Orders     None        Orpah Greek, MD 11/30/20 1453

## 2020-11-30 NOTE — ED Notes (Signed)
Pt refused temp. Pt was upset that she didn't get something else for pain. Pt's bp was elevated, because she was agitated and kept moving her arm.

## 2020-11-30 NOTE — ED Notes (Signed)
Pt has 3+ swelling of right hand and wrist, cap refill less than 3 sec, 2+ right radial pulse, warm to touch, 2/5 right hand grip. Pt denies injury to hand or wrist.

## 2020-12-01 ENCOUNTER — Other Ambulatory Visit: Payer: Self-pay | Admitting: Internal Medicine

## 2020-12-01 DIAGNOSIS — R0602 Shortness of breath: Secondary | ICD-10-CM

## 2020-12-02 ENCOUNTER — Other Ambulatory Visit: Payer: Self-pay | Admitting: Internal Medicine

## 2020-12-02 ENCOUNTER — Other Ambulatory Visit: Payer: Self-pay | Admitting: Student

## 2020-12-02 DIAGNOSIS — R0602 Shortness of breath: Secondary | ICD-10-CM

## 2020-12-02 DIAGNOSIS — E876 Hypokalemia: Secondary | ICD-10-CM

## 2020-12-05 ENCOUNTER — Ambulatory Visit (INDEPENDENT_AMBULATORY_CARE_PROVIDER_SITE_OTHER): Payer: Medicare Other | Admitting: Student

## 2020-12-05 ENCOUNTER — Encounter: Payer: Self-pay | Admitting: Student

## 2020-12-05 VITALS — BP 165/86 | HR 71 | Wt 179.3 lb

## 2020-12-05 DIAGNOSIS — M13139 Monoarthritis, not elsewhere classified, unspecified wrist: Secondary | ICD-10-CM

## 2020-12-05 MED ORDER — PREDNISONE 10 MG (21) PO TBPK: ORAL_TABLET | ORAL | 0 refills | Status: AC

## 2020-12-05 NOTE — Patient Instructions (Signed)
Ms. Jergens,  It was a pleasure meeting you today in clinic.  For your right wrist pain, swelling and weakness, we would like to obtain some blood work today. I will call you with the results. Also, we have placed a referral for rheumatology as well. Please be on the lookout for a call to schedule this appointment. Finally, since a prednisone taper managed your symptoms well, I will refill this medication for you now. This is not a chronic prescription medication.  Sincerely, Dr. Paulla Dolly, MD

## 2020-12-05 NOTE — Assessment & Plan Note (Addendum)
Patient presents for follow-up of right wrist pain. She states that she continues to have swelling, pain, tenderness and weakness of the right wrist. Since her last office visit on May 25th, patient has been seen by orthopedics and emergency department physician for evaluation of this problem. She states that today she is interested in obtaining labwork for this condition. She has yet to receive contact about rheumatology referral. She has been taking Vicodin and gabapentin which partially help with the pain. She is interested in repeat course of prednisone as this greatly reduced her symptom burden. On physical examination, she does have an approximately 2cm area of swelling of the right dorsal wrist which is tender to palpation with no surrounding erythema or warmth. Primary concern for an inflammatory monoarthritis. Agree with recommendation by Orthopedics for rheumatology referral and uric acid level. -Obtain uric acid level -Rheumatology referral placed through [facility] system -Prednisone taper 20mg  x7d then 10mg  x7d -Continue following closely with Orthopedics Hand Surgeons -Would not initiate oral NSAIDs including celebrex at this time

## 2020-12-05 NOTE — Progress Notes (Signed)
   CC: Right wrist pain  HPI:  [name] is a 70 y.o. 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 Refer to problem list for charting of this encounter.  Past Medical History:  Diagnosis Date   Arthritis    Asthma    Breast cancer ([place]) 2001   Left Breast Cancer   CHF (congestive heart failure) (HCC)    COPD (chronic obstructive pulmonary disease) (HCC)    Coronary artery disease    History of mitral valve replacement with mechanical valve    HLD (hyperlipidemia)    Hypertension    Pulmonary embolism ([place])    Review of Systems:  Endorses right wrist pain, swelling and weakness. Denies fevers, chills.  Physical Exam:  Vitals:   12/05/20 1012  BP: (!) 165/86  Pulse: 71  SpO2: 98%  Weight: 179 lb 4.8 oz (81.3 kg)   Physical Exam Constitutional:      General: She is not in acute distress.    Appearance: She is not ill-appearing.  Musculoskeletal:     Comments: 2cm area of swelling on dorsal aspect of right wrist with tenderness to palpation.  Skin:    Comments: No surrounding erythema or warmth to right wrist  Neurological:     Mental Status: She is alert.     Comments: Subtle weakness with extension of right wrist   Psychiatric:        Mood and Affect: Mood normal.        Behavior: Behavior normal.   Assessment & Plan:   See Encounters Tab for problem based charting.  Patient discussed with Dr. Dareen Piano

## 2020-12-06 LAB — URIC ACID: Uric Acid: 9.9 mg/dL — ABNORMAL HIGH (ref 3.1–7.9)

## 2020-12-06 NOTE — Progress Notes (Signed)
Internal Medicine Clinic Attending  Case discussed with Dr. Johnson  At the time of the visit.  We reviewed the resident's history and exam and pertinent patient test results.  I agree with the assessment, diagnosis, and plan of care documented in the resident's note.  

## 2020-12-10 ENCOUNTER — Ambulatory Visit: Payer: Medicare Other | Admitting: *Deleted

## 2020-12-10 DIAGNOSIS — M13139 Monoarthritis, not elsewhere classified, unspecified wrist: Secondary | ICD-10-CM

## 2020-12-10 DIAGNOSIS — I5042 Chronic combined systolic (congestive) and diastolic (congestive) heart failure: Secondary | ICD-10-CM

## 2020-12-10 DIAGNOSIS — I10 Essential (primary) hypertension: Secondary | ICD-10-CM

## 2020-12-10 DIAGNOSIS — J449 Chronic obstructive pulmonary disease, unspecified: Secondary | ICD-10-CM

## 2020-12-10 NOTE — Chronic Care Management (AMB) (Signed)
Care Management    RN Visit Note  12/10/2020 Name: Kim Anthony [name]: [id number] DOB: 07-17-1950  Subjective: Kim Anthony is a 70 y.o. year old female who is a primary care patient of Marianna Payment, MD. The care management team was consulted for assistance with disease management and care coordination needs.    Engaged with patient by telephone for follow up visit in response to provider referral for case management and/or care coordination services.   Consent to Services:   Kim Anthony was given information about Care Management services today including:  Care Management services includes personalized support from designated clinical staff supervised by her physician, including individualized plan of care and coordination with other care providers 24/7 contact phone numbers for assistance for urgent and routine care needs. The patient may stop case management services at any time by phone call to the office staff.  Patient agreed to services and consent obtained.   Assessment: Review of patient past medical history, allergies, medications, health status, including review of consultants reports, laboratory and other test data, was performed as part of comprehensive evaluation and provision of chronic care management services.   SDOH (Social Determinants of Health) assessments and interventions performed:    Care Plan  Allergies  Allergen Reactions   Acetaminophen-Codeine Itching   Propoxyphene Itching    Darvocet   Tape Rash    Plastic   Tramadol Itching and Nausea And Vomiting    Outpatient Encounter Medications as of 12/10/2020  Medication Sig   albuterol (PROAIR HFA) 108 (90 Base) MCG/ACT inhaler Inhale 2 puffs into the lungs every 6 (six) hours as needed for wheezing or shortness of breath.   albuterol (PROVENTIL) (2.5 MG/3ML) 0.083% nebulizer solution USE 1 VIAL IN NEBULIZER EVERY 6 HOURS AS NEEDED FOR WHEEZING FOR SHORTNESS OF BREATH   albuterol (VENTOLIN HFA) 108 (90 Base)  MCG/ACT inhaler Inhale 1-2 puffs into the lungs every 6 (six) hours as needed for wheezing or shortness of breath.   Calcium-Phosphorus-Vitamin D (CITRACAL +D3 PO) Take 1 tablet by mouth 2 (two) times a day.   cetirizine (ZYRTEC ALLERGY) 10 MG tablet Take 1 tablet (10 mg total) by mouth daily.   fluticasone (FLONASE) 50 MCG/ACT nasal spray Place 1 spray into both nostrils daily as needed for allergies.   furosemide (LASIX) 40 MG tablet Take 1 tablet by mouth twice daily   gabapentin (NEURONTIN) 600 MG tablet Take 1.5 tablets (900 mg total) by mouth 3 (three) times daily. For nerve pain- increased to get her off lyrica   HYDROcodone-acetaminophen (NORCO) 5-325 MG tablet Take 1 tablet by mouth every 4 (four) hours as needed for moderate pain.   lisinopril (ZESTRIL) 20 MG tablet Take 1 tablet (20 mg total) by mouth daily.   metoprolol succinate (TOPROL-XL) 25 MG 24 hr tablet Take 1 tablet (25 mg total) by mouth daily.   potassium chloride SA (KLOR-CON) 20 MEQ tablet Take 2 tablets by mouth once daily   predniSONE (STERAPRED UNI-PAK 21 TAB) 10 MG (21) TBPK tablet Take 2 tablets (20 mg total) by mouth daily for 7 days, THEN 1 tablet (10 mg total) daily for 7 days.   simvastatin (ZOCOR) 20 MG tablet Take 1 tablet by mouth once daily   Spacer/Aero-Holding Chambers (BREATHERITE COLL SPACER ADULT) MISC 1 applicator by Does not apply route daily.   STIOLTO RESPIMAT 2.5-2.5 MCG/ACT AERS INHALE 2 PUFFS BY MOUTH ONCE DAILY   warfarin (COUMADIN) 4 MG tablet Take by mouth.   warfarin (COUMADIN) 5  MG tablet TAKE 1 TABLET BY MOUTH ONCE DAILY EXCEPT  ON  WEDNESDAYS,  TAKE  1/2  TABLET   No facility-administered encounter medications on file as of 12/10/2020.    Patient Active Problem List   Diagnosis Date Noted   Acute idiopathic gout of multiple sites 09/30/2020   Eye redness 02/15/2020   Chronic pain syndrome 01/17/2020   Monoarthritis of wrist 10/18/2019   Ganglion cyst 09/21/2019   Hip pain 08/23/2019    Thoracic ascending aortic aneurysm ([place]) 07/05/2019   Pustular rash 03/09/2019   Anemia 02/16/2019   Breast cancer ([place])    Acquired absence of left breast 12/20/2018   Open wound of axillary region 05/11/2018   COPD (chronic obstructive pulmonary disease) ([place]) 11/25/2017   S/P MVR (mitral valve replacement)    Chronic anticoagulation    Tobacco use disorder 02/25/2017   Lymphadenopathy, mediastinal 02/25/2017   History of mitral valve replacement with mechanical valve 11/18/2016   Essential hypertension 02/01/2016   Allergic rhinitis 02/01/2016   Healthcare maintenance 01/30/2016   Chronic low back pain 07/22/2015   Pulmonary embolism ([place]) 07/11/2015   Chronic combined systolic and diastolic congestive heart failure ([place]) 07/11/2015   Headache, common migraine 07/11/2015   HLD (hyperlipidemia) 07/11/2015    Conditions to be addressed/monitored: COPD, Asthma, HF, HTN, HLD, on Coumadin r/t MVR, chronic back pain and right wrist pain,  on opioids, tobacco use  Care Plan : Chronic Pain (Adult)  Updates made by Barrington Ellison, RN since 12/10/2020 12:00 AM     Problem: Chronic Pain Management (Chronic Pain)      Goal: Chronic Pain Managed- acute on chronic right wrist pain is managed   Start Date: 06/20/2020  Expected End Date: 12/19/2020  Recent Progress: On track  Priority: High  Note:   CARE PLAN ENTRY (see longitudinal plan of care for additional care plan information)  Current Barriers:  Knowledge Deficits related to managing acute/chronic pain- spoke with patient via phone on 12/10/20- states she was in ED for severe hand and wrist pain and swollen fingers on 6/11, she then made a clinic appointment and was seen on 6/16 and placed on steroid and referred to rheumatologist who she will see in the next week, says she says she continues to wear her right wrist brace,  denies any more falls since last month and she and her daughter received information from community care guide  Gregary Signs on a personal emergency response system , reports she is taking her medications as prescribed. She says her back pain is about the same and continues to keep her appointments with pain management MD.   Chronic Disease Management support and education needs related to chronic pain  Nurse Case Manager Clinical Goal(s):  Over the next 30-60 days, patient will verbalize understanding of plan for managing pain Over the next 30-60  days, patient will attend all scheduled medical appointments:   Interventions:  Evaluation of current treatment plan related to right wrist pain and patient's adherence to plan as established by provider. Advised patient to keep all scheduled appointments including upcoming imaging and hand specialist appointments Referred to community care guide for assistance in securing personal emergency response resources Discussed plans with patient for ongoing care management follow up and provided patient with direct contact information for care management team  Patient Self Care Activities:  Patient verbalizes understanding of plan to work with providers in treating right wrist pain Self administers medications as prescribed Attends all scheduled provider appointments Calls pharmacy  for medication refills Calls provider office for new concerns or questions  keep all provider appointments  - use distraction techniques - use relaxation during pain   -wear right wrist brace - contact provider for unrelieved severe pain Follow up Plan: The care management team will reach out to the patient again over the next 30-60 days.     Care Plan : COPD (Adult)  Updates made by Barrington Ellison, RN since 12/10/2020 12:00 AM     Problem: Symptom Exacerbation (COPD)- call provider when symptoms not improving despite   Priority: High     Long-Range Goal: Symptom Exacerbation Prevented or Minimized to prevent hospitalization or need for emergency treatment   Start Date: 06/20/2020   Expected End Date: 12/19/2020  Recent Progress: On track  Priority: High  Note:   Current Barriers:  Knowledge deficits related to basic COPD self care/management- returned call to patient and was given phone number for [facility] that advertises the portable nebulizer with car cigarette lighter adapter, says she will be traveling out of [place] in early July and would like to have the portable nebulizer before she travels   Case Manager Clinical Goal(s): Over the next 30-60 days, patient will not be hospitalized for COPD exacerbation  Interventions:  Advised patient to self assesses COPD action plan zone and make appointment with provider if in the yellow zone for 48 hours without improvement. Provided education about and advised patient to utilize infection prevention strategies to reduce risk of respiratory infection  09/16/20- Advised patient that provider sent the Rx for portable nebulizer sent to [place] on [facility] or per Skeet Latch, [employer] representative, portable nebulizers are now in stock at [place] store in [facility] and patient could purchase one there 11/12/20- advised patient to call this CCM RN with the phone number of the portable nebulizer company when she sees the TV ad 12/10/20- Spoke with Clawson at [facility] at [phone]; she states they provide the portable machine and the nebulizer medications, says patient should have no out of pocket cost for the portable nebulizer if she has had her current nebulizer for > 5 years 12/10/20- attempted to reach patient via mobile number to ask about age of home nebulizer, unable to leave message as voice mailbox not set up  12/10/20- Community message sent to [employer] Representatives asking if patient's current nebulizer can be adapted to use in car and if not, the age of her current nebulizer Patient Goals/Self-Care Activities:  eliminate symptom triggers at home - follow rescue plan if symptoms  flare-up - keep follow-up appointments - use an extra pillow to sleep if needed Follow Up Plan: The care management team will reach out to the patient again over the next 30-60 days.       Plan: The care management team will reach out to the patient again over the next 30 days.  Kelli Churn RN, CCM, [facility] RN Care Manager [phone]

## 2020-12-10 NOTE — Patient Instructions (Signed)
Visit Information   Goals Addressed               This Visit's Progress     Patient Stated     Patient Stated- " I need a portable nebulizer that I can use in the car." (pt-stated)        Timeframe:  Short-Term Goal Priority:  High Start Date:      12/10/20                       Expected End Date:   01/18/21                 Follow Up Date 01/18/21  - contact CCM RN with phone number of company that advertises the portable nebulizer with car adapter  - follow up on any requests form CCM RN in securing portable nebulizer    [place]- patinet called clinic requesting to speak with CCM RN to provide the phone number for [facility] that advertises the portable nebulizer, phone number [phone]       Track and Manage My Symptoms-COPD- "I know when to use my nebulizer machine." (pt-stated)        Timeframe:  Long-Range Goal Priority:  High Start Date:          06/20/20                   Expected End Date:   ongoing                    Follow Up Date 01/18/21   - eliminate symptom triggers at home - follow rescue plan if symptoms flare-up - keep follow-up appointments - use an extra pillow to sleep if needed   Why is this important?   Tracking your symptoms and other information about your health helps your doctor plan your care.  Write down the symptoms, the time of day, what you were doing and what medicine you are taking.  You will soon learn how to manage your symptoms.     Notes: 12/10/20- investigating portable nebulizer for use in care         The patient verbalized understanding of instructions, educational materials, and care plan provided today and declined offer to receive copy of patient instructions, educational materials, and care plan.   Kelli Churn RN, CCM, [facility] RN Care Manager [phone]

## 2020-12-11 ENCOUNTER — Telehealth: Payer: Self-pay | Admitting: *Deleted

## 2020-12-11 NOTE — Telephone Encounter (Signed)
Ms Waterfield called to request a refill on her hydrocodone.  Her last fill date was 11/15/20 and next appt is 01/08/21.

## 2020-12-12 ENCOUNTER — Other Ambulatory Visit: Payer: Self-pay | Admitting: Internal Medicine

## 2020-12-12 ENCOUNTER — Other Ambulatory Visit: Payer: Self-pay | Admitting: Student

## 2020-12-12 MED ORDER — HYDROCODONE-ACETAMINOPHEN 5-325 MG PO TABS: 1.0000 | ORAL_TABLET | ORAL | 0 refills | Status: DC | PRN

## 2020-12-12 NOTE — Telephone Encounter (Signed)
Notified. 

## 2020-12-12 NOTE — Telephone Encounter (Signed)
Refilled Norco 5/325 q4 hours prn #150- last refilled 5/27- is due to be refilled- Will send in today- sent.

## 2020-12-16 ENCOUNTER — Telehealth: Payer: Self-pay

## 2020-12-16 ENCOUNTER — Telehealth: Payer: Medicare Other

## 2020-12-16 ENCOUNTER — Telehealth: Payer: Self-pay | Admitting: *Deleted

## 2020-12-16 ENCOUNTER — Other Ambulatory Visit: Payer: Self-pay | Admitting: Pharmacist

## 2020-12-16 NOTE — Telephone Encounter (Signed)
  Care Management   Outreach Note  12/16/2020 Name: Kim Anthony [name]: [id number] DOB: 03/07/1951  Referred by: Marianna Payment, MD Reason for referral : Care Coordination (COPD, Asthma, HF, HTN, HLD, on Coumadin r/t MVR, chronic back pain and right wrist pain,  on opioids)   An unsuccessful telephone outreach was attempted today. The patient was referred to the case management team for assistance with care management and care coordination.   Follow Up Plan: The care management team will reach out to the patient again over the next 7 days.   Kelli Churn RN, CCM, [facility] RN Care Manager [phone]

## 2020-12-16 NOTE — Telephone Encounter (Signed)
Warfarin refilled earlier today. Confirmed appt with patient for INR check on 7/11 at 0900.  Sterapred was sent on 6/16 for 14 day course. Call placed to patient. States she needs the prednisone for her hand swelling. States she took last dose yesterday. States swelling returns as soon as she stops the prednisone. She is traveling to [place] 7/2-7/8 and needs enough prednisone to last her until she returns to [place]. She has an appt with Rheumatology on 7/27.

## 2020-12-16 NOTE — Telephone Encounter (Signed)
warfarin (COUMADIN) 5 MG tablet   predniSONE (STERAPRED UNI-PAK 21 TAB) 10 MG (21) TBPK tablet, refill request @  [place], [address] Phone:  [phone]  Fax:  [phone]

## 2020-12-17 ENCOUNTER — Ambulatory Visit: Payer: Medicare Other | Admitting: *Deleted

## 2020-12-17 DIAGNOSIS — I5042 Chronic combined systolic (congestive) and diastolic (congestive) heart failure: Secondary | ICD-10-CM

## 2020-12-17 DIAGNOSIS — J449 Chronic obstructive pulmonary disease, unspecified: Secondary | ICD-10-CM

## 2020-12-17 DIAGNOSIS — I10 Essential (primary) hypertension: Secondary | ICD-10-CM

## 2020-12-17 DIAGNOSIS — M13139 Monoarthritis, not elsewhere classified, unspecified wrist: Secondary | ICD-10-CM

## 2020-12-17 NOTE — Chronic Care Management (AMB) (Signed)
Care Management    RN Visit Note  12/17/2020 Name: Kim Anthony [name]: [id number] DOB: 1950-07-17  Subjective: Kim Anthony is a 70 y.o. year old female who is a primary care patient of Kim Payment, MD. The care management team was consulted for assistance with disease management and care coordination needs.    Engaged with patient by telephone for follow up visit in response to provider referral for case management and/or care coordination services.   Consent to Services:   Kim Anthony was given information about Care Management services today including:  Care Management services includes personalized support from designated clinical staff supervised by her physician, including individualized plan of care and coordination with other care providers 24/7 contact phone numbers for assistance for urgent and routine care needs. The patient may stop case management services at any time by phone call to the office staff.  Patient agreed to services and consent obtained.   Assessment: Review of patient past medical history, allergies, medications, health status, including review of consultants reports, laboratory and other test data, was performed as part of comprehensive evaluation and provision of chronic care management services.   SDOH (Social Determinants of Health) assessments and interventions performed:    Care Plan  Allergies  Allergen Reactions   Acetaminophen-Codeine Itching   Propoxyphene Itching    Darvocet   Tape Rash    Plastic   Tramadol Itching and Nausea And Vomiting    Outpatient Encounter Medications as of 12/17/2020  Medication Sig   albuterol (PROAIR HFA) 108 (90 Base) MCG/ACT inhaler Inhale 2 puffs into the lungs every 6 (six) hours as needed for wheezing or shortness of breath.   albuterol (PROVENTIL) (2.5 MG/3ML) 0.083% nebulizer solution USE 1 VIAL IN NEBULIZER EVERY 6 HOURS AS NEEDED FOR WHEEZING FOR SHORTNESS OF BREATH   albuterol (VENTOLIN HFA) 108 (90 Base)  MCG/ACT inhaler Inhale 1-2 puffs into the lungs every 6 (six) hours as needed for wheezing or shortness of breath.   Calcium-Phosphorus-Vitamin D (CITRACAL +D3 PO) Take 1 tablet by mouth 2 (two) times a day.   cetirizine (ZYRTEC ALLERGY) 10 MG tablet Take 1 tablet (10 mg total) by mouth daily.   fluticasone (FLONASE) 50 MCG/ACT nasal spray Place 1 spray into both nostrils daily as needed for allergies.   furosemide (LASIX) 40 MG tablet Take 1 tablet by mouth twice daily   gabapentin (NEURONTIN) 600 MG tablet Take 1.5 tablets (900 mg total) by mouth 3 (three) times daily. For nerve pain- increased to get her off lyrica   HYDROcodone-acetaminophen (NORCO) 5-325 MG tablet Take 1 tablet by mouth every 4 (four) hours as needed for moderate pain.   lisinopril (ZESTRIL) 20 MG tablet Take 1 tablet (20 mg total) by mouth daily.   metoprolol succinate (TOPROL-XL) 25 MG 24 hr tablet Take 1 tablet (25 mg total) by mouth daily.   potassium chloride SA (KLOR-CON) 20 MEQ tablet Take 2 tablets by mouth once daily   predniSONE (STERAPRED UNI-PAK 21 TAB) 10 MG (21) TBPK tablet Take 2 tablets (20 mg total) by mouth daily for 7 days, THEN 1 tablet (10 mg total) daily for 7 days.   simvastatin (ZOCOR) 20 MG tablet Take 1 tablet by mouth once daily   Spacer/Aero-Holding Chambers (BREATHERITE COLL SPACER ADULT) MISC 1 applicator by Does not apply route daily.   STIOLTO RESPIMAT 2.5-2.5 MCG/ACT AERS INHALE 2 PUFFS BY MOUTH ONCE DAILY   warfarin (COUMADIN) 4 MG tablet Take by mouth.   warfarin (COUMADIN) 5  MG tablet TAKE 1 TABLET BY MOUTH ONCE DAILY EXCEPT  WEDNESDAYS  TAKE  ONE  HALF  TABLET (Patient taking differently: Take one (1) tablet of your 3m peach-colored warfarin tablets, once-daily at 6PM--EXCEPT on Tuesdays and Fridays, take ONLY one-half (1/2) tablet on these days.)   No facility-administered encounter medications on file as of 12/17/2020.    Patient Active Problem List   Diagnosis Date Noted   Acute  idiopathic gout of multiple sites 09/30/2020   Eye redness 02/15/2020   Chronic pain syndrome 01/17/2020   Monoarthritis of wrist 10/18/2019   Ganglion cyst 09/21/2019   Hip pain 08/23/2019   Thoracic ascending aortic aneurysm ([facility] 07/05/2019   Pustular rash 03/09/2019   Anemia 02/16/2019   Breast cancer ([facility]    Acquired absence of left breast 12/20/2018   Open wound of axillary region 05/11/2018   COPD (chronic obstructive pulmonary disease) ([facility] 11/25/2017   S/P MVR (mitral valve replacement)    Chronic anticoagulation    Tobacco use disorder 02/25/2017   Lymphadenopathy, mediastinal 02/25/2017   History of mitral valve replacement with mechanical valve 11/18/2016   Essential hypertension 02/01/2016   Allergic rhinitis 02/01/2016   Healthcare maintenance 01/30/2016   Chronic low back pain 07/22/2015   Pulmonary embolism ([facility] 07/11/2015   Chronic combined systolic and diastolic congestive heart failure ([facility] 07/11/2015   Headache, common migraine 07/11/2015   HLD (hyperlipidemia) 07/11/2015    Conditions to be addressed/monitored: COPD, Asthma, HF, HTN, HLD, on Coumadin r/t MVR, chronic back pain and right wrist pain,  on opioids, tobacco use  Care Plan : Chronic Pain (Adult)  Updates made by HBarrington Ellison RN since 12/17/2020 12:00 AM     Problem: Chronic Pain Management (Chronic Pain)      Goal: Chronic Pain Managed- acute on chronic right wrist pain is managed   Start Date: 06/20/2020  Expected End Date: 12/19/2020  Recent Progress: On track  Priority: High  Note:   CARE PLAN ENTRY (see longitudinal plan of care for additional care plan information)  Current Barriers:  Knowledge Deficits related to managing acute/chronic pain- spoke with patient via phone on 12/17/20- states she will be out to town for a week beginning 7/2 and would like enough Prednisone to keep her hand and wrist pain bearable while she is out of town, she says she promises she will keep her  initial rheumatology appointment on 7/27   Chronic Disease Management support and education needs related to chronic pain  Nurse Case Manager Clinical Goal(s):  Over the next 30-60 days, patient will verbalize understanding of plan for managing pain Over the next 30-60  days, patient will attend all scheduled medical appointments:   Interventions:  Evaluation of current treatment plan related to right wrist pain and patient's adherence to plan as established by provider. 12/17/20- messaged provider regarding patient's request for refill on Prednisone to maintain treatment and manage wrist and hand pain while she is out of town Advised patient to keep all scheduled appointments including upcoming initial rheumatology appointment on 7/27 Referred to community care guide for assistance in securing personal emergency response resources Discussed plans with patient for ongoing care management follow up and provided patient with direct contact information for care management team  Patient Self Care Activities:  Patient verbalizes understanding of plan to work with providers in treating right wrist pain Self administers medications as prescribed Attends all scheduled provider appointments Calls pharmacy for medication refills Calls provider office for new concerns or questions  keep all provider appointments  - use distraction techniques - use relaxation during pain   -wear right wrist brace - contact provider for unrelieved severe pain Follow up Plan: The care management team will reach out to the patient again over the next 30-60 days.     Care Plan : COPD (Adult)  Updates made by Barrington Ellison, RN since 12/17/2020 12:00 AM     Problem: Symptom Exacerbation (COPD)- call provider when symptoms not improving despite   Priority: High     Long-Range Goal: Symptom Exacerbation Prevented or Minimized to prevent hospitalization or need for emergency treatment   Start Date: 06/20/2020   Expected End Date: 12/19/2020  Recent Progress: On track  Priority: High  Note:   Current Barriers:  Knowledge deficits related to basic COPD self care/management- successful outreach to , she states a portable nebulizer was delivered to her home on 6/25 and she has already used it in the car and really likes it. She was nto able to tell this RNCM what DME company delivered the portable nebulizer.    Case Manager Clinical Goal(s): Over the next 30-60 days, patient will not be hospitalized for COPD exacerbation  Interventions:  Advised patient to self assesses COPD action plan zone and make appointment with provider if in the yellow zone for 48 hours without improvement. Provided education about and advised patient to utilize infection prevention strategies to reduce risk of respiratory infection  09/16/20- Advised patient that provider sent the Rx for portable nebulizer sent to [facility] on [facility] or per Skeet Latch, [employer] representative, portable nebulizers are now in stock at [employer] store in [facility] and patient could purchase one there 11/12/20- advised patient to call this CCM RN with the phone number of the portable nebulizer company when she sees the TV ad 12/10/20- Spoke with Towanda at [place] at [phone]; she states they provide the portable machine and the nebulizer medications, says patient should have no out of pocket cost for the portable nebulizer if she has had her current nebulizer for > 5 years 12/10/20- attempted to reach patient via mobile number to ask about age of home nebulizer, unable to leave message as voice mailbox not set up  12/10/20- Community message sent to [employer] Representatives asking if patient's current nebulizer can be adapted to use in car and if not, the age of her current nebulizer 12/17/20- Verified that patient received portable nebulizer and that she has used it in the car successfully  Patient Goals/Self-Care  Activities:  eliminate symptom triggers at home - follow rescue plan if symptoms flare-up - keep follow-up appointments - use an extra pillow to sleep if needed Follow Up Plan: The care management team will reach out to the patient again over the next 30-60 days.      Care Plan : CCM RN Hypertension (Adult)  Updates made by Barrington Ellison, RN since 12/17/2020 12:00 AM     Problem: Hypertension (Hypertension)      Long-Range Goal: Hypertension Monitored   Recent Progress: On track  Priority: High  Note:   Objective:  Last practice recorded BP readings:  BP Readings from Last 3 Encounters:  12/05/20 (!) 165/86  11/30/20 (!) 174/158  11/12/20 128/76   Most recent eGFR/CrCl:  Lab Results  Component Value Date   EGFR 79 08/26/2020    No components found for: CRCL Current Barriers:  Knowledge Deficits related to basic understanding of hypertension pathophysiology and self  care management Unable to independently self educate on HTN self management to meet treatment targets - patient states she is going out of town for one week and needs refill on Lasix Case Manager Clinical Goal(s):  patient will verbalize understanding of plan for hypertension management patient will attend all scheduled medical appointments: including all follow up clinic appointments patient will demonstrate improved adherence to prescribed treatment plan for hypertension as evidenced by taking all medications as prescribed, monitoring and recording blood pressure as directed, adhering to low sodium/DASH diet patient will demonstrate improved health management independence as evidenced by checking blood pressure as directed and notifying PCP if SBP>140 or DBP > 90,  taking all medications as prescribe, and adhering to a low sodium diet as discussed. Interventions:  Collaboration with Kim Payment, MD regarding development and update of comprehensive plan of care as evidenced by provider attestation and  co-signature Inter-disciplinary care team collaboration (see longitudinal plan of care)- 12/17/20- messaged provider of patient's request for refill on lasix before she leaves for out of town trip on 7/2 Evaluation of current treatment plan related to hypertension self management and patient's adherence to plan as established by provider. Provided education to patient re: stroke prevention, s/s of heart attack and stroke, DASH diet, complications of uncontrolled blood pressure Reviewed medications with patient and discussed importance of compliance Encouraged use of home blood pressure monitoring daily and recording on blood pressure log sheet in [facility] spiral bound notebook/day planner; include symptoms of hypotension or potential medication side effects in log.  Reviewed blood pressure treatment targets Discussed plans with patient for ongoing care management follow up and provided patient with direct contact information for care management team- informed patient as of mid July 2022, role of CCM RNCM will transition to [place] scheduled/upcoming provider appointments including: initial appointment with rheumatology on 7/27 Self-Care Activities: - Self administers medications as prescribed Attends all scheduled provider appointments Calls provider office for new concerns, questions, or BP outside discussed parameters Checks BP and records as discussed Follows a low sodium diet/DASH diet Patient Goals: - check blood pressure weekly - ask questions to understand - learn about high blood pressure  Follow Up Plan: The care management team will reach out to the patient again over the next 30 days.                Plan: The care management team will reach out to the patient again over the next 30 days.  Kelli Churn RN, CCM, [facility] RN Care Manager [phone]

## 2020-12-18 ENCOUNTER — Other Ambulatory Visit: Payer: Medicare Other

## 2020-12-18 ENCOUNTER — Other Ambulatory Visit: Payer: Self-pay | Admitting: Student

## 2020-12-18 DIAGNOSIS — M13139 Monoarthritis, not elsewhere classified, unspecified wrist: Secondary | ICD-10-CM

## 2020-12-19 ENCOUNTER — Other Ambulatory Visit: Payer: Self-pay | Admitting: Student

## 2020-12-19 ENCOUNTER — Other Ambulatory Visit: Payer: Medicare Other

## 2020-12-19 DIAGNOSIS — J449 Chronic obstructive pulmonary disease, unspecified: Secondary | ICD-10-CM

## 2020-12-21 ENCOUNTER — Other Ambulatory Visit: Payer: Self-pay | Admitting: Student

## 2020-12-21 DIAGNOSIS — R0602 Shortness of breath: Secondary | ICD-10-CM

## 2020-12-24 ENCOUNTER — Encounter: Payer: Self-pay | Admitting: *Deleted

## 2020-12-30 ENCOUNTER — Ambulatory Visit: Payer: Medicare Other

## 2020-12-31 ENCOUNTER — Ambulatory Visit: Payer: Medicare Other | Admitting: *Deleted

## 2020-12-31 DIAGNOSIS — I5042 Chronic combined systolic (congestive) and diastolic (congestive) heart failure: Secondary | ICD-10-CM

## 2020-12-31 DIAGNOSIS — I10 Essential (primary) hypertension: Secondary | ICD-10-CM

## 2020-12-31 DIAGNOSIS — M13139 Monoarthritis, not elsewhere classified, unspecified wrist: Secondary | ICD-10-CM

## 2020-12-31 DIAGNOSIS — J449 Chronic obstructive pulmonary disease, unspecified: Secondary | ICD-10-CM

## 2020-12-31 NOTE — Chronic Care Management (AMB) (Signed)
  Care Management   Note  12/31/2020 Name: Kim Anthony [name]: [id number] DOB: 1951-02-19  [name] is enrolled in a Managed Medicaid plan: No. Outreach attempt today was successful.   Called patient to advise her she needs to have lab work completed prior to her initial visit with Dr Benjamine Mola on 01/15/21. Patient voiced understanding and compliance. Says she will make lab appointment in clinic this week. At patient's request provided her with phone number to Dr Rice's office so she can notify them she has completed lab work.  Also informed patient the role of clinic CCM RN will transition from this CCM RN to [name] on 01/03/21.   The care management team will reach out to the patient again over the next 30 days.   Kelli Churn RN, CCM, [facility] RN Care Manager [phone]

## 2021-01-01 ENCOUNTER — Other Ambulatory Visit: Payer: Medicare Other

## 2021-01-01 DIAGNOSIS — M13139 Monoarthritis, not elsewhere classified, unspecified wrist: Secondary | ICD-10-CM

## 2021-01-02 DIAGNOSIS — M25531 Pain in right wrist: Secondary | ICD-10-CM | POA: Diagnosis not present

## 2021-01-02 DIAGNOSIS — M79641 Pain in right hand: Secondary | ICD-10-CM | POA: Diagnosis not present

## 2021-01-05 ENCOUNTER — Emergency Department (HOSPITAL_COMMUNITY): Payer: Medicare Other

## 2021-01-05 ENCOUNTER — Other Ambulatory Visit: Payer: Self-pay

## 2021-01-05 ENCOUNTER — Inpatient Hospital Stay (HOSPITAL_COMMUNITY)
Admission: EM | Admit: 2021-01-05 | Discharge: 2021-01-11 | DRG: 286 | Disposition: A | Discharge status: Home / Self Care | Payer: Medicare Other | Attending: Internal Medicine | Admitting: Internal Medicine

## 2021-01-05 DIAGNOSIS — Z6830 Body mass index (BMI) 30.0-30.9, adult: Secondary | ICD-10-CM

## 2021-01-05 DIAGNOSIS — I2510 Atherosclerotic heart disease of native coronary artery without angina pectoris: Secondary | ICD-10-CM | POA: Diagnosis present

## 2021-01-05 DIAGNOSIS — Z952 Presence of prosthetic heart valve: Secondary | ICD-10-CM

## 2021-01-05 DIAGNOSIS — J449 Chronic obstructive pulmonary disease, unspecified: Secondary | ICD-10-CM | POA: Insufficient documentation

## 2021-01-05 DIAGNOSIS — Z20822 Contact with and (suspected) exposure to covid-19: Secondary | ICD-10-CM | POA: Diagnosis present

## 2021-01-05 DIAGNOSIS — R0600 Dyspnea, unspecified: Secondary | ICD-10-CM | POA: Diagnosis present

## 2021-01-05 DIAGNOSIS — Z79899 Other long term (current) drug therapy: Secondary | ICD-10-CM | POA: Diagnosis not present

## 2021-01-05 DIAGNOSIS — I272 Pulmonary hypertension, unspecified: Secondary | ICD-10-CM | POA: Diagnosis not present

## 2021-01-05 DIAGNOSIS — B373 Candidiasis of vulva and vagina: Secondary | ICD-10-CM | POA: Diagnosis present

## 2021-01-05 DIAGNOSIS — E785 Hyperlipidemia, unspecified: Secondary | ICD-10-CM | POA: Diagnosis present

## 2021-01-05 DIAGNOSIS — Z86718 Personal history of other venous thrombosis and embolism: Secondary | ICD-10-CM

## 2021-01-05 DIAGNOSIS — Z803 Family history of malignant neoplasm of breast: Secondary | ICD-10-CM

## 2021-01-05 DIAGNOSIS — J441 Chronic obstructive pulmonary disease with (acute) exacerbation: Secondary | ICD-10-CM

## 2021-01-05 DIAGNOSIS — J9601 Acute respiratory failure with hypoxia: Secondary | ICD-10-CM | POA: Diagnosis not present

## 2021-01-05 DIAGNOSIS — E876 Hypokalemia: Secondary | ICD-10-CM

## 2021-01-05 DIAGNOSIS — Z923 Personal history of irradiation: Secondary | ICD-10-CM

## 2021-01-05 DIAGNOSIS — R0603 Acute respiratory distress: Secondary | ICD-10-CM | POA: Diagnosis not present

## 2021-01-05 DIAGNOSIS — I5042 Chronic combined systolic (congestive) and diastolic (congestive) heart failure: Secondary | ICD-10-CM | POA: Diagnosis present

## 2021-01-05 DIAGNOSIS — I110 Hypertensive heart disease with heart failure: Secondary | ICD-10-CM | POA: Diagnosis not present

## 2021-01-05 DIAGNOSIS — Z951 Presence of aortocoronary bypass graft: Secondary | ICD-10-CM

## 2021-01-05 DIAGNOSIS — R6889 Other general symptoms and signs: Secondary | ICD-10-CM | POA: Diagnosis not present

## 2021-01-05 DIAGNOSIS — Z853 Personal history of malignant neoplasm of breast: Secondary | ICD-10-CM

## 2021-01-05 DIAGNOSIS — M25531 Pain in right wrist: Secondary | ICD-10-CM | POA: Diagnosis not present

## 2021-01-05 DIAGNOSIS — T501X5A Adverse effect of loop [high-ceiling] diuretics, initial encounter: Secondary | ICD-10-CM | POA: Diagnosis present

## 2021-01-05 DIAGNOSIS — F17210 Nicotine dependence, cigarettes, uncomplicated: Secondary | ICD-10-CM | POA: Diagnosis present

## 2021-01-05 DIAGNOSIS — I071 Rheumatic tricuspid insufficiency: Secondary | ICD-10-CM | POA: Diagnosis not present

## 2021-01-05 DIAGNOSIS — Z743 Need for continuous supervision: Secondary | ICD-10-CM | POA: Diagnosis not present

## 2021-01-05 DIAGNOSIS — I5040 Unspecified combined systolic (congestive) and diastolic (congestive) heart failure: Secondary | ICD-10-CM | POA: Diagnosis present

## 2021-01-05 DIAGNOSIS — Z885 Allergy status to narcotic agent status: Secondary | ICD-10-CM

## 2021-01-05 DIAGNOSIS — I5043 Acute on chronic combined systolic (congestive) and diastolic (congestive) heart failure: Secondary | ICD-10-CM | POA: Diagnosis not present

## 2021-01-05 DIAGNOSIS — G8929 Other chronic pain: Secondary | ICD-10-CM | POA: Diagnosis not present

## 2021-01-05 DIAGNOSIS — Z9012 Acquired absence of left breast and nipple: Secondary | ICD-10-CM

## 2021-01-05 DIAGNOSIS — I10 Essential (primary) hypertension: Secondary | ICD-10-CM | POA: Diagnosis not present

## 2021-01-05 DIAGNOSIS — I517 Cardiomegaly: Secondary | ICD-10-CM | POA: Diagnosis not present

## 2021-01-05 DIAGNOSIS — R0609 Other forms of dyspnea: Secondary | ICD-10-CM | POA: Diagnosis not present

## 2021-01-05 DIAGNOSIS — Z888 Allergy status to other drugs, medicaments and biological substances status: Secondary | ICD-10-CM

## 2021-01-05 DIAGNOSIS — T3695XA Adverse effect of unspecified systemic antibiotic, initial encounter: Secondary | ICD-10-CM | POA: Diagnosis not present

## 2021-01-05 DIAGNOSIS — M549 Dorsalgia, unspecified: Secondary | ICD-10-CM | POA: Diagnosis not present

## 2021-01-05 DIAGNOSIS — I499 Cardiac arrhythmia, unspecified: Secondary | ICD-10-CM | POA: Diagnosis not present

## 2021-01-05 DIAGNOSIS — R0602 Shortness of breath: Secondary | ICD-10-CM | POA: Diagnosis not present

## 2021-01-05 DIAGNOSIS — M1990 Unspecified osteoarthritis, unspecified site: Secondary | ICD-10-CM | POA: Diagnosis present

## 2021-01-05 DIAGNOSIS — R0789 Other chest pain: Secondary | ICD-10-CM | POA: Diagnosis not present

## 2021-01-05 DIAGNOSIS — Z91048 Other nonmedicinal substance allergy status: Secondary | ICD-10-CM

## 2021-01-05 DIAGNOSIS — R079 Chest pain, unspecified: Secondary | ICD-10-CM | POA: Diagnosis not present

## 2021-01-05 DIAGNOSIS — E7800 Pure hypercholesterolemia, unspecified: Secondary | ICD-10-CM | POA: Diagnosis not present

## 2021-01-05 DIAGNOSIS — Z8249 Family history of ischemic heart disease and other diseases of the circulatory system: Secondary | ICD-10-CM

## 2021-01-05 DIAGNOSIS — Z9221 Personal history of antineoplastic chemotherapy: Secondary | ICD-10-CM

## 2021-01-05 DIAGNOSIS — Z86711 Personal history of pulmonary embolism: Secondary | ICD-10-CM

## 2021-01-05 DIAGNOSIS — Z7901 Long term (current) use of anticoagulants: Secondary | ICD-10-CM | POA: Diagnosis not present

## 2021-01-05 DIAGNOSIS — I50814 Right heart failure due to left heart failure: Secondary | ICD-10-CM | POA: Diagnosis present

## 2021-01-05 LAB — CBC WITH DIFFERENTIAL/PLATELET
Abs Immature Granulocytes: 0.02 10*3/uL (ref 0.00–0.07)
Basophils Absolute: 0.1 10*3/uL (ref 0.0–0.1)
Basophils Relative: 1 %
Eosinophils Absolute: 0.1 10*3/uL (ref 0.0–0.5)
Eosinophils Relative: 1 %
HCT: 43.3 % (ref 36.0–46.0)
Hemoglobin: 14 g/dL (ref 12.0–15.0)
Immature Granulocytes: 0 %
Lymphocytes Relative: 21 %
Lymphs Abs: 1.8 10*3/uL (ref 0.7–4.0)
MCH: 32.9 pg (ref 26.0–34.0)
MCHC: 32.3 g/dL (ref 30.0–36.0)
MCV: 101.9 fL — ABNORMAL HIGH (ref 80.0–100.0)
Monocytes Absolute: 0.6 10*3/uL (ref 0.1–1.0)
Monocytes Relative: 8 %
Neutro Abs: 5.8 10*3/uL (ref 1.7–7.7)
Neutrophils Relative %: 69 %
Platelets: 400 10*3/uL (ref 150–400)
RBC: 4.25 MIL/uL (ref 3.87–5.11)
RDW: 12.1 % (ref 11.5–15.5)
WBC: 8.5 10*3/uL (ref 4.0–10.5)
nRBC: 0 % (ref 0.0–0.2)

## 2021-01-05 LAB — COMPREHENSIVE METABOLIC PANEL
ALT: 23 U/L (ref 0–44)
AST: 53 U/L — ABNORMAL HIGH (ref 15–41)
Albumin: 3.6 g/dL (ref 3.5–5.0)
Alkaline Phosphatase: 111 U/L (ref 38–126)
Anion gap: 14 (ref 5–15)
BUN: 6 mg/dL — ABNORMAL LOW (ref 8–23)
CO2: 25 mmol/L (ref 22–32)
Calcium: 9.1 mg/dL (ref 8.9–10.3)
Chloride: 101 mmol/L (ref 98–111)
Creatinine, Ser: 0.66 mg/dL (ref 0.44–1.00)
GFR, Estimated: >60 mL/min (ref >60)
Glucose, Bld: 96 mg/dL (ref 70–99)
Potassium: 3.1 mmol/L — ABNORMAL LOW (ref 3.5–5.1)
Sodium: 140 mmol/L (ref 135–145)
Total Bilirubin: 0.7 mg/dL (ref 0.3–1.2)
Total Protein: 7.8 g/dL (ref 6.5–8.1)

## 2021-01-05 LAB — POC SARS CORONAVIRUS 2 AG -  ED: SARSCOV2ONAVIRUS 2 AG: NEGATIVE

## 2021-01-05 LAB — PROTIME-INR
INR: 1.9 — ABNORMAL HIGH (ref 0.8–1.2)
Prothrombin Time: 21.5 seconds — ABNORMAL HIGH (ref 11.4–15.2)

## 2021-01-05 LAB — TROPONIN I (HIGH SENSITIVITY): Troponin I (High Sensitivity): 11 ng/L (ref <18)

## 2021-01-05 LAB — SARS CORONAVIRUS 2 (TAT 6-24 HRS): SARS Coronavirus 2: NEGATIVE

## 2021-01-05 LAB — BRAIN NATRIURETIC PEPTIDE: B Natriuretic Peptide: 209.8 pg/mL — ABNORMAL HIGH (ref 0.0–100.0)

## 2021-01-05 MED ORDER — IPRATROPIUM-ALBUTEROL 0.5-2.5 (3) MG/3ML IN SOLN
3.0000 mL | Freq: Once | RESPIRATORY_TRACT | Status: AC
  Administered 2021-01-05: 3 mL via RESPIRATORY_TRACT
  Filled 2021-01-05: qty 3

## 2021-01-05 MED ORDER — PREDNISONE 20 MG PO TABS
40.0000 mg | ORAL_TABLET | Freq: Every day | ORAL | Status: AC
  Administered 2021-01-06 – 2021-01-08 (×3): 40 mg via ORAL
  Filled 2021-01-05 (×3): qty 2

## 2021-01-05 MED ORDER — ALBUTEROL SULFATE (2.5 MG/3ML) 0.083% IN NEBU: 2.5000 mg | INHALATION_SOLUTION | RESPIRATORY_TRACT | Status: DC | PRN

## 2021-01-05 MED ORDER — POTASSIUM CHLORIDE CRYS ER 20 MEQ PO TBCR
40.0000 meq | EXTENDED_RELEASE_TABLET | Freq: Two times a day (BID) | ORAL | Status: DC
  Administered 2021-01-05 – 2021-01-06 (×2): 40 meq via ORAL
  Filled 2021-01-05 (×3): qty 2

## 2021-01-05 MED ORDER — ENOXAPARIN SODIUM 40 MG/0.4ML IJ SOSY
40.0000 mg | PREFILLED_SYRINGE | INTRAMUSCULAR | Status: DC
  Administered 2021-01-05: 40 mg via SUBCUTANEOUS
  Filled 2021-01-05: qty 0.4

## 2021-01-05 MED ORDER — CALCIUM CARBONATE-VITAMIN D 500-200 MG-UNIT PO TABS
1.0000 | ORAL_TABLET | Freq: Every day | ORAL | Status: DC
  Administered 2021-01-06 – 2021-01-11 (×5): 1 via ORAL
  Filled 2021-01-05 (×6): qty 1

## 2021-01-05 MED ORDER — SODIUM CHLORIDE 0.9 % IV SOLN
1.0000 g | INTRAVENOUS | Status: DC
  Administered 2021-01-05: 1 g via INTRAVENOUS
  Filled 2021-01-05: qty 1
  Filled 2021-01-05: qty 10

## 2021-01-05 MED ORDER — WARFARIN - PHARMACIST DOSING INPATIENT
Freq: Every day | Status: DC
Start: 2021-01-06 — End: 2021-01-11

## 2021-01-05 MED ORDER — SODIUM CHLORIDE 0.9 % IV SOLN
INTRAVENOUS | Status: DC | PRN
  Administered 2021-01-05: 250 mL via INTRAVENOUS

## 2021-01-05 MED ORDER — GABAPENTIN 600 MG PO TABS
900.0000 mg | ORAL_TABLET | Freq: Three times a day (TID) | ORAL | Status: DC
  Administered 2021-01-05 – 2021-01-11 (×14): 900 mg via ORAL
  Filled 2021-01-05 (×3): qty 2
  Filled 2021-01-05: qty 1.5
  Filled 2021-01-05 (×9): qty 2
  Filled 2021-01-05: qty 1.5
  Filled 2021-01-05 (×3): qty 2

## 2021-01-05 MED ORDER — WARFARIN SODIUM 5 MG PO TABS
5.0000 mg | ORAL_TABLET | Freq: Once | ORAL | Status: AC
  Administered 2021-01-05: 5 mg via ORAL
  Filled 2021-01-05 (×2): qty 1

## 2021-01-05 MED ORDER — AZITHROMYCIN 500 MG PO TABS
500.0000 mg | ORAL_TABLET | Freq: Every day | ORAL | Status: AC
  Administered 2021-01-05: 500 mg via ORAL
  Filled 2021-01-05: qty 1

## 2021-01-05 MED ORDER — AZITHROMYCIN 500 MG PO TABS
250.0000 mg | ORAL_TABLET | Freq: Every day | ORAL | Status: DC
  Administered 2021-01-06 – 2021-01-07 (×2): 250 mg via ORAL
  Filled 2021-01-05 (×2): qty 1

## 2021-01-05 MED ORDER — HYDROCODONE-ACETAMINOPHEN 5-325 MG PO TABS
1.0000 | ORAL_TABLET | ORAL | Status: DC | PRN
  Administered 2021-01-05 – 2021-01-11 (×28): 1 via ORAL
  Filled 2021-01-05 (×27): qty 1

## 2021-01-05 MED ORDER — METHYLPREDNISOLONE SODIUM SUCC 125 MG IJ SOLR
125.0000 mg | Freq: Once | INTRAMUSCULAR | Status: AC
  Administered 2021-01-05: 125 mg via INTRAVENOUS
  Filled 2021-01-05: qty 2

## 2021-01-05 MED ORDER — IPRATROPIUM-ALBUTEROL 0.5-2.5 (3) MG/3ML IN SOLN
3.0000 mL | Freq: Two times a day (BID) | RESPIRATORY_TRACT | Status: DC
  Administered 2021-01-06: 3 mL via RESPIRATORY_TRACT
  Filled 2021-01-05: qty 3

## 2021-01-05 MED ORDER — AMOXICILLIN-POT CLAVULANATE 875-125 MG PO TABS: 1.0000 | ORAL_TABLET | Freq: Two times a day (BID) | ORAL | Status: DC

## 2021-01-05 MED ORDER — ACETAMINOPHEN 500 MG PO TABS
1000.0000 mg | ORAL_TABLET | Freq: Once | ORAL | Status: AC
  Administered 2021-01-05: 1000 mg via ORAL
  Filled 2021-01-05: qty 2

## 2021-01-05 MED ORDER — ALBUTEROL SULFATE HFA 108 (90 BASE) MCG/ACT IN AERS
2.0000 | INHALATION_SPRAY | Freq: Four times a day (QID) | RESPIRATORY_TRACT | Status: DC | PRN
  Filled 2021-01-05: qty 6.7

## 2021-01-05 MED ORDER — SIMVASTATIN 20 MG PO TABS
20.0000 mg | ORAL_TABLET | Freq: Every day | ORAL | Status: DC
  Administered 2021-01-06 – 2021-01-07 (×2): 20 mg via ORAL
  Filled 2021-01-05 (×3): qty 1

## 2021-01-05 MED ORDER — LISINOPRIL 20 MG PO TABS
20.0000 mg | ORAL_TABLET | Freq: Every day | ORAL | Status: DC
  Administered 2021-01-05 – 2021-01-06 (×2): 20 mg via ORAL
  Filled 2021-01-05 (×2): qty 1

## 2021-01-05 MED ORDER — FUROSEMIDE 10 MG/ML IJ SOLN
40.0000 mg | Freq: Once | INTRAMUSCULAR | Status: AC
  Administered 2021-01-05: 40 mg via INTRAVENOUS
  Filled 2021-01-05: qty 4

## 2021-01-05 MED ORDER — CALCIUM-PHOSPHORUS-VITAMIN D 250-107-500 MG-MG-UNIT PO CHEW: 1.0000 | CHEWABLE_TABLET | Freq: Every day | ORAL | Status: DC

## 2021-01-05 MED ORDER — IPRATROPIUM-ALBUTEROL 0.5-2.5 (3) MG/3ML IN SOLN
3.0000 mL | Freq: Four times a day (QID) | RESPIRATORY_TRACT | Status: DC
  Administered 2021-01-05: 3 mL via RESPIRATORY_TRACT
  Filled 2021-01-05: qty 3

## 2021-01-05 MED ORDER — IPRATROPIUM-ALBUTEROL 0.5-2.5 (3) MG/3ML IN SOLN: 3.0000 mL | Freq: Four times a day (QID) | RESPIRATORY_TRACT | Status: DC

## 2021-01-05 MED ORDER — LORATADINE 10 MG PO TABS
10.0000 mg | ORAL_TABLET | Freq: Every day | ORAL | Status: DC
  Administered 2021-01-05 – 2021-01-11 (×7): 10 mg via ORAL
  Filled 2021-01-05 (×7): qty 1

## 2021-01-05 MED ORDER — RAMELTEON 8 MG PO TABS
8.0000 mg | ORAL_TABLET | Freq: Every day | ORAL | Status: DC | PRN
  Administered 2021-01-05 – 2021-01-06 (×2): 8 mg via ORAL
  Filled 2021-01-05 (×3): qty 1

## 2021-01-05 MED ORDER — ALBUTEROL SULFATE (2.5 MG/3ML) 0.083% IN NEBU
5.0000 mg | INHALATION_SOLUTION | Freq: Once | RESPIRATORY_TRACT | Status: AC
  Administered 2021-01-05: 5 mg via RESPIRATORY_TRACT
  Filled 2021-01-05: qty 6

## 2021-01-05 MED ORDER — METOPROLOL SUCCINATE ER 25 MG PO TB24
25.0000 mg | ORAL_TABLET | Freq: Every day | ORAL | Status: DC
  Administered 2021-01-05 – 2021-01-11 (×7): 25 mg via ORAL
  Filled 2021-01-05 (×7): qty 1

## 2021-01-05 MED ORDER — FENTANYL CITRATE (PF) 100 MCG/2ML IJ SOLN
25.0000 ug | Freq: Once | INTRAMUSCULAR | Status: AC
  Administered 2021-01-05: 25 ug via INTRAVENOUS
  Filled 2021-01-05: qty 2

## 2021-01-05 MED ORDER — FUROSEMIDE 10 MG/ML IJ SOLN
40.0000 mg | Freq: Every day | INTRAMUSCULAR | Status: DC
  Administered 2021-01-06: 40 mg via INTRAVENOUS
  Filled 2021-01-05 (×2): qty 4

## 2021-01-05 NOTE — H&P (Addendum)
Date: 01/05/2021               Patient Name:  Kim Anthony [name]: [id number]  DOB: 03/15/1951 Age / Sex: 70 y.o., female   PCP: Marianna Payment, MD         Medical Service: Internal Medicine Teaching Service         Attending Physician: Dr. Sid Falcon, MD    First Contact: Dr. Baldwin [place] Pager: [phone]  Second Contact: Dr. Laural Golden Pager: [phone]       After Hours (After 5p/  First Contact Pager: [phone]  weekends / holidays): Second Contact Pager: [phone]   Chief Complaint: shortness of breath  History of Present Illness: Kim Anthony is a 70 year old female with a past medical history of COPD, CHF, breast cancer status post left breast mastectomy, CAD, hypertension, hyperlipidemia, history of PE and mitral valve replacement with mechanical valve presenting with 3 days of dyspnea.  She states 3 days ago she started feeling more short of breath and having a productive cough and wheezing.  States that her phlegm is yellow in nature.  She does endorse recent sick contacts, 2 family members who had a cold but she does not feel like she had any cold symptoms. States that this morning around 3 AM she woke up to use the bathroom and started feeling extremely short of breath so she took 2 breathing treatments without improvement.  She then told her daughter to call EMS to bring her to the hospital.  She endorses chest tightness and ongoing dyspnea.  She denies any fevers, congestion, nausea, vomiting or abdominal pain.  She is unable to lay completely flat states that she sleeps with multiple pillows.  States that she has been urinating without any trouble.  She states that she takes furosemide 40 mg twice daily at home.  She does not feel like she has had any weight gain.  Denies any lower extremity swelling or abdominal distention.  Meds:  Current Meds  Medication Sig   albuterol (PROVENTIL) (2.5 MG/3ML) 0.083% nebulizer solution USE 1 VIAL IN NEBULIZER EVERY 6 HOURS AS NEEDED FOR  WHEEZING FOR SHORTNESS OF BREATH (Patient taking differently: Take 2.5 mg by nebulization every 6 (six) hours as needed for wheezing or shortness of breath.)   albuterol (VENTOLIN HFA) 108 (90 Base) MCG/ACT inhaler Inhale 1-2 puffs into the lungs every 6 (six) hours as needed for wheezing or shortness of breath.   Calcium-Phosphorus-Vitamin D (CITRACAL +D3 PO) Take 1 tablet by mouth 2 (two) times a day.   cetirizine (ZYRTEC ALLERGY) 10 MG tablet Take 1 tablet (10 mg total) by mouth daily.   fluticasone (FLONASE) 50 MCG/ACT nasal spray Place 1 spray into both nostrils daily as needed for allergies.   furosemide (LASIX) 40 MG tablet Take 1 tablet by mouth twice daily   gabapentin (NEURONTIN) 600 MG tablet Take 1.5 tablets (900 mg total) by mouth 3 (three) times daily. For nerve pain- increased to get her off lyrica   HYDROcodone-acetaminophen (NORCO) 5-325 MG tablet Take 1 tablet by mouth every 4 (four) hours as needed for moderate pain.   metoprolol succinate (TOPROL-XL) 25 MG 24 hr tablet Take 1 tablet (25 mg total) by mouth daily.   potassium chloride SA (KLOR-CON) 20 MEQ tablet Take 2 tablets by mouth once daily (Patient taking differently: Take 40 mEq by mouth daily.)   simvastatin (ZOCOR) 20 MG tablet Take 1 tablet by mouth once daily   STIOLTO RESPIMAT 2.5-2.5  MCG/ACT AERS INHALE 2 PUFFS BY MOUTH ONCE DAILY (Patient taking differently: Take 2 puffs by mouth daily.)   warfarin (COUMADIN) 5 MG tablet TAKE 1 TABLET BY MOUTH ONCE DAILY EXCEPT  WEDNESDAYS  TAKE  ONE  HALF  TABLET (Patient taking differently: Take 2.5-5 mg by mouth See admin instructions. Take one (1) tablet of your 5mg  peach-colored warfarin tablets, once-daily at 6PM--EXCEPT on Wednesdays, take ONLY one-half (1/2) tablet on these days.)     Allergies: Allergies as of 01/05/2021 - Review Complete 01/05/2021  Allergen Reaction Noted   Acetaminophen-codeine Itching 07/10/2015   Propoxyphene Itching 01/18/2019   Tape Rash  03/09/2019   Tramadol Itching and Nausea And Vomiting 07/10/2015   Past Medical History:  Diagnosis Date   Arthritis    Asthma    Breast cancer ([place]) 2001   Left Breast Cancer   CHF (congestive heart failure) (HCC)    COPD (chronic obstructive pulmonary disease) (HCC)    Coronary artery disease    History of mitral valve replacement with mechanical valve    HLD (hyperlipidemia)    Hypertension    Pulmonary embolism ([place])     Family History:  Family History  Problem Relation Age of Onset   Heart attack Other    Breast cancer Maternal Aunt 50     Social History: She lives at home with her daughter.  Able to complete all her ADLs without any difficulties.  She reports everyday tobacco use since she was 70 years old.  States that she smokes anywhere from 3 to 7 cigarettes a day.  She drinks alcohol occasionally on the weekends.  Denies any illicit drug use.  Review of Systems: A complete ROS was negative except as per HPI.   Physical Exam: Blood pressure (!) 150/128, pulse 83, temperature 98.8 F (37.1 C), temperature source Oral, resp. rate (!) 24, height 5\' 5"  (1.651 m), weight 83.9 kg, SpO2 93 %.  Physical Exam General: alert, appears stated age, in no acute distress HEENT: Normocephalic, atraumatic, EOM intact, conjunctiva normal CV: Regular rate and rhythm, no murmurs rubs or gallops, no JVD, left side of chest tender to palpation Pulm: Clear to auscultation bilaterally, normal work of breathing, saturating well on room air Abdomen: Soft, nondistended, bowel sounds present, no tenderness to palpation MSK: No lower extremity edema Skin: Warm and dry, large surgical scar along the left breast mastectomy site Neuro: Alert and oriented x3   EKG: Not released per chart review however ED provider reports sinus rhythm, rate of 73 nonspecific T wave changes  CXR: personally reviewed my interpretation is diffuse interstitial pulmonary edema  Assessment & Plan by  Problem: Active Problems:   Chronic combined systolic and diastolic congestive heart failure (HCC)   COPD (chronic obstructive pulmonary disease) (HCC)   Dyspnea  Kim Anthony is a 70 year old female with past medical history of COPD, CHF, history of pulmonary emboli and mitral valve repair on warfarin presenting for evaluation of worsening dyspnea.  Dyspnea COPD exacerbation HFpEF Patient presents with a 3-day history of dyspnea with productive cough and wheezing.  States that this morning around 3 AM she woke up to use the bathroom and was having difficulty breathing and chest tightness so she took 2 home breathing treatments without improvement.  She has been maintaining saturations above 94% on room air.  Mildly tachypneic intermittently.  No fevers or leukocytosis.  She was given 1 dose of IV furosemide 40 mg with good urinary output.  Exam is unremarkable, patient appears  euvolemic no JVD.  Lung exam was clear to auscultation bilaterally with good air movement.  Of note however patient had recently just received a breathing treatment.  BNP only slightly elevated 200s.  Chest x-ray consistent with pulmonary edema. COVID-negative. Differential diagnosis includes mild CHF exacerbation versus COPD exacerbation.  Last echocardiogram was in 2020 with normal EF 55 to 60%. Will continue IV diuretics and repeat echocardiogram.  Patient does meet 2 out of the 3 cardinal symptoms to classify as a moderate COPD exacerbation; dyspnea and increased sputum production. Will also start scheduled DuoNebs and continue oral steroids as well as antibiotics for 3 to 5 days.   -Given 1 dose of IV Solu-Medrol in the ED -Continue IV furosemide 40 mg daily -Oral prednisone 40 mg starting tomorrow for 3 to 5 days -Ceftriaxone and azithromycin for 3-5 days  -Follow-up echocardiogram -Strict ins and outs -Daily weights -Trend BMP  Hypertension Continue home medications include lisinopril 20 mg, Toprol 25 mg,  Lasix 40 mg twice daily.  History of mitral valve replacement with mechanical valve History of pulmonary embolism Continue warfarin per pharmacy consult  Hypokalemia Potassium 3.1.  Replete as needed. -Trend BMP  Chronic pain Patient reports chronic back pain as well as right wrist pain for which she has been taking hydrocodone-acetaminophen.  Also reports a history of nerve pain for which she takes gabapentin.  This was increased to get her off Lyrica. - Continue home medications  Diet: Heart healthy IV fluids: None Full code  Dispo: Admit patient to Observation with expected length of stay less than 2 midnights.  Signed: [name], DO 01/05/2021, 6:12 PM  Pager: [phone] After 5pm on weekdays and 1pm on weekends: On Call pager: [phone]

## 2021-01-05 NOTE — ED Notes (Signed)
Report called to albert rn on 5w

## 2021-01-05 NOTE — ED Notes (Signed)
Pain med given for her back pain which is chronic

## 2021-01-05 NOTE — ED Notes (Signed)
The pt reports that she has had to void numerus times  Offered pure wick  purwick placed   Pt c/o back pain

## 2021-01-05 NOTE — ED Provider Notes (Signed)
[place] EMERGENCY DEPARTMENT Provider Note   CSN: [id number] Arrival date & time: 01/05/21  0848     History Chief Complaint  Patient presents with   Shortness of Breath    Pt arrived via [facility] with c/c of shOB. Per EMS pt was awoke around 0300 with shOB with hx of COPD & CHF. Pt took 2 breathing treatments w/o improvements. Endorses chest tightness. Pt had heart value placed 2001. Denies fevers, N/V, Abd pain.  96%RA but in tripod position, 158/100,  1 Duo-Neb given     Kim Anthony is a 70 y.o. female.  HPI Patient with multiple medical issues presents via EMS due to dyspnea.  Patient awoke about 6 hours prior to ED arrival with dyspnea.  She has history of COPD, CHF, has been taking her medication regularly.  She took 2 breathing treatments without improvement at home, and called for transport. She also notes chest tightness, diffuse, worse with breathing. She has a history of multiple medical issues as above and heart valve replacement in 2001.  She denies other concurrent complaints including nausea, vomiting, fever, abdominal pain.  EMS reports patient was tachypneic, tachycardic, requiring supplemental oxygen in route.  She improved with DuoNeb provided.    Past Medical History:  Diagnosis Date   Arthritis    Asthma    Breast cancer ([place]) 2001   Left Breast Cancer   CHF (congestive heart failure) (HCC)    COPD (chronic obstructive pulmonary disease) ([place])    Coronary artery disease    History of mitral valve replacement with mechanical valve    HLD (hyperlipidemia)    Hypertension    Pulmonary embolism [facility])     Patient Active Problem List   Diagnosis Date Noted   Acute idiopathic gout of multiple sites 09/30/2020   Eye redness 02/15/2020   Chronic pain syndrome 01/17/2020   Monoarthritis of wrist 10/18/2019   Ganglion cyst 09/21/2019   Hip pain 08/23/2019   Thoracic ascending aortic aneurysm ([place]) 07/05/2019   Pustular rash 03/09/2019    Anemia 02/16/2019   Breast cancer ([place])    Acquired absence of left breast 12/20/2018   Open wound of axillary region 05/11/2018   COPD (chronic obstructive pulmonary disease) ([place]) 11/25/2017   S/P MVR (mitral valve replacement)    Chronic anticoagulation    Tobacco use disorder 02/25/2017   Lymphadenopathy, mediastinal 02/25/2017   History of mitral valve replacement with mechanical valve 11/18/2016   Essential hypertension 02/01/2016   Allergic rhinitis 02/01/2016   Healthcare maintenance 01/30/2016   Chronic low back pain 07/22/2015   Pulmonary embolism ([place]) 07/11/2015   Chronic combined systolic and diastolic congestive heart failure ([place]) 07/11/2015   Headache, common migraine 07/11/2015   HLD (hyperlipidemia) 07/11/2015    Past Surgical History:  Procedure Laterality Date   APPLICATION OF A-CELL OF CHEST/ABDOMEN Left 01/27/2019   Procedure: APPLICATION OF A-CELL OF CHEST;  Surgeon: Wallace Going, DO;  Location: [place];  Service: Plastics;  Laterality: Left;   APPLICATION OF WOUND VAC N/A 01/27/2019   Procedure: APPLICATION OF WOUND VAC;  Surgeon: Wallace Going, DO;  Location: [place];  Service: Plastics;  Laterality: N/A;   COLONOSCOPY WITH PROPOFOL N/A 06/07/2018   Procedure: COLONOSCOPY WITH PROPOFOL;  Surgeon: Wilford Corner, MD;  Location: [facility];  Service: Endoscopy;  Laterality: N/A;   DEBRIDEMENT AND CLOSURE WOUND Left 12/28/2018   Procedure: Debridement And Closure Wound;  Surgeon: Coralie Keens, MD;  Location: [place];  Service:  General;  Laterality: Left;   INCISION AND DRAINAGE OF WOUND Left 01/27/2019   Procedure: IRRIGATION AND DEBRIDEMENT OF CHEST WALL;  Surgeon: Wallace Going, DO;  Location: [place];  Service: Plastics;  Laterality: Left;   LATISSIMUS FLAP TO BREAST Left 01/18/2019   Procedure: LEFT LATISSIMUS FLAP TO BREAST;  Surgeon: Wallace Going, DO;  Location: [place];  Service: Plastics;  Laterality: Left;   MASTECTOMY Left  2000s   for breast cancer, also s/p radiation and chemo   MITRAL VALVE REPLACEMENT     mechanical   POLYPECTOMY  06/07/2018   Procedure: POLYPECTOMY;  Surgeon: Wilford Corner, MD;  Location: [facility];  Service: Endoscopy;;   WOUND DEBRIDEMENT Left 12/28/2018   Procedure: EXCISION OF LEFT CHRONIC CHEST WALL WOUND;  Surgeon: Coralie Keens, MD;  Location: [place];  Service: General;  Laterality: Left;     OB History     Gravida  2   Para  2   Term  2   Preterm      AB      Living         SAB      IAB      Ectopic      Multiple      Live Births  2           Family History  Problem Relation Age of Onset   Heart attack Other    Breast cancer Maternal Aunt 48    Social History   Tobacco Use   Smoking status: Every Day    Packs/day: 0.25    Years: 30.00    Pack years: 7.50    Types: Cigarettes   Smokeless tobacco: Never   Tobacco comments:    Sometimes less. 4 per day  Vaping Use   Vaping Use: Never used  Substance Use Topics   Alcohol use: Yes    Comment: beer- ocasional   Drug use: No    Home Medications Prior to Admission medications   Medication Sig Start Date End Date Taking? Authorizing Provider  albuterol (PROAIR HFA) 108 (90 Base) MCG/ACT inhaler Inhale 2 puffs into the lungs every 6 (six) hours as needed for wheezing or shortness of breath. 09/26/20 10/26/20  Marianna Payment, MD  albuterol (PROVENTIL) (2.5 MG/3ML) 0.083% nebulizer solution USE 1 VIAL IN NEBULIZER EVERY 6 HOURS AS NEEDED FOR WHEEZING FOR SHORTNESS OF BREATH 11/27/20   Cato Mulligan, MD  albuterol (VENTOLIN HFA) 108 (90 Base) MCG/ACT inhaler Inhale 1-2 puffs into the lungs every 6 (six) hours as needed for wheezing or shortness of breath. 08/26/20   Seawell, Jaimie A, DO  Calcium-Phosphorus-Vitamin D (CITRACAL +D3 PO) Take 1 tablet by mouth 2 (two) times a day.    [provider]  cetirizine (ZYRTEC ALLERGY) 10 MG tablet Take 1 tablet (10 mg total) by mouth daily.  08/26/20 08/26/21  Seawell, Jaimie A, DO  fluticasone (FLONASE) 50 MCG/ACT nasal spray Place 1 spray into both nostrils daily as needed for allergies. 08/26/20 08/26/21  Seawell, Jaimie A, DO  furosemide (LASIX) 40 MG tablet Take 1 tablet by mouth twice daily 12/25/20   Sanjuan Dame, MD  gabapentin (NEURONTIN) 600 MG tablet Take 1.5 tablets (900 mg total) by mouth 3 (three) times daily. For nerve pain- increased to get her off lyrica 09/30/20   Lovorn, Jinny Blossom, MD  HYDROcodone-acetaminophen (NORCO) 5-325 MG tablet Take 1 tablet by mouth every 4 (four) hours as needed for moderate pain. 12/12/20   Lovorn, [place],  MD  lisinopril (ZESTRIL) 20 MG tablet Take 1 tablet (20 mg total) by mouth daily. 10/30/20   Asencion Noble, MD  metoprolol succinate (TOPROL-XL) 25 MG 24 hr tablet Take 1 tablet (25 mg total) by mouth daily. 02/22/20   Cato Mulligan, MD  potassium chloride SA (KLOR-CON) 20 MEQ tablet Take 2 tablets by mouth once daily 12/03/20   Maudie Mercury, MD  simvastatin (ZOCOR) 20 MG tablet Take 1 tablet by mouth once daily 10/28/20   Asencion Noble, MD  Spacer/Aero-Holding Chambers (BREATHERITE COLL SPACER ADULT) MISC 1 applicator by Does not apply route daily. 01/11/20   [place], Carley D, DO  STIOLTO RESPIMAT 2.5-2.5 MCG/ACT AERS INHALE 2 PUFFS BY MOUTH ONCE DAILY 12/20/20   Marianna Payment, MD  warfarin (COUMADIN) 4 MG tablet Take by mouth.    [provider]  warfarin (COUMADIN) 5 MG tablet TAKE 1 TABLET BY MOUTH ONCE DAILY EXCEPT  WEDNESDAYS  TAKE  ONE  HALF  TABLET Patient taking differently: Take one (1) tablet of your 5mg  peach-colored warfarin tablets, once-daily at 6PM--EXCEPT on Tuesdays and Fridays, take ONLY one-half (1/2) tablet on these days. 12/16/20   Maudie Mercury, MD    Allergies    Acetaminophen-codeine, Propoxyphene, Tape, and Tramadol  Review of Systems   Review of Systems  Constitutional:        Per HPI, otherwise negative  HENT:         Per HPI, otherwise negative   Respiratory:         Per HPI, otherwise negative  Cardiovascular:        Per HPI, otherwise negative  Gastrointestinal:  Negative for vomiting.  Endocrine:       Negative aside from HPI  Genitourinary:        Neg aside from HPI   Musculoskeletal:        Per HPI, otherwise negative  Skin: Negative.   Neurological:  Negative for syncope.   Physical Exam Updated Vital Signs BP (!) 149/81   Pulse 75   Temp 98.8 F (37.1 C) (Oral)   Resp 20   Ht 5\' 5"  (1.651 m)   Wt 83.9 kg   SpO2 93%   BMI 30.79 kg/m   Physical Exam Vitals and nursing note reviewed.  Constitutional:      General: She is not in acute distress.    Appearance: She is well-developed. She is ill-appearing and diaphoretic.  HENT:     Head: Normocephalic and atraumatic.  Eyes:     Conjunctiva/sclera: Conjunctivae normal.  Cardiovascular:     Rate and Rhythm: Regular rhythm. Tachycardia present.  Pulmonary:     Effort: Tachypnea and accessory muscle usage present.     Breath sounds: Decreased breath sounds and wheezing present.  Chest:    Abdominal:     General: There is no distension.  Skin:    General: Skin is warm.  Neurological:     Mental Status: She is alert and oriented to person, place, and time.     Cranial Nerves: No cranial nerve deficit.    ED Results / Procedures / Treatments   Labs (all labs ordered are listed, but only abnormal results are displayed) Labs Reviewed  CBC WITH DIFFERENTIAL/PLATELET - Abnormal; Notable for the following components:      Result Value   MCV 101.9 (*)    All other components within normal limits  BRAIN NATRIURETIC PEPTIDE - Abnormal; Notable for the following components:   B Natriuretic Peptide 209.8 (*)  All other components within normal limits  PROTIME-INR - Abnormal; Notable for the following components:   Prothrombin Time 21.5 (*)    INR 1.9 (*)    All other components within normal limits  COMPREHENSIVE METABOLIC PANEL - Abnormal; Notable for  the following components:   Potassium 3.1 (*)    BUN 6 (*)    AST 53 (*)    All other components within normal limits  SARS CORONAVIRUS 2 (TAT 6-24 HRS)  POC SARS CORONAVIRUS 2 AG -  ED  POC SARS CORONAVIRUS 2 AG -  ED    EKG Sinus rhythm, rate 73, nonspecific T wave changes, artifact, abnormal Cardiac monitor rate 70s, sinus, unremarkable Pulse ox 98% room air normal   Radiology DG Chest Port 1 View  Result Date: 01/05/2021 CLINICAL DATA:  Shortness of breath EXAM: PORTABLE CHEST 1 VIEW COMPARISON:  02/15/2020 FINDINGS: Prior median sternotomy and cardiac valve replacement. Stable cardiomegaly. Chronic left apical scarring. Diffusely increased interstitial markings throughout both lung fields. No pleural effusion or pneumothorax. IMPRESSION: Diffusely increased interstitial markings throughout both lung fields, may represent pulmonary edema versus atypical/viral infection. Electronically Signed   By: Davina Poke D.O.   On: 01/05/2021 09:41    Procedures Procedures   Medications Ordered in ED Medications  methylPREDNISolone sodium succinate (SOLU-MEDROL) 125 mg/2 mL injection 125 mg (125 mg Intravenous Given 01/05/21 1156)  albuterol (PROVENTIL) (2.5 MG/3ML) 0.083% nebulizer solution 5 mg (5 mg Nebulization Given 01/05/21 0941)  acetaminophen (TYLENOL) tablet 1,000 mg (1,000 mg Oral Given 01/05/21 1157)  ipratropium-albuterol (DUONEB) 0.5-2.5 (3) MG/3ML nebulizer solution 3 mL (3 mLs Nebulization Given 01/05/21 1158)  furosemide (LASIX) injection 40 mg (40 mg Intravenous Given 01/05/21 1429)  fentaNYL (SUBLIMAZE) injection 25 mcg (25 mcg Intravenous Given 01/05/21 1428)    ED Course  I have reviewed the triage vital signs and the nursing notes.  Pertinent labs & imaging results that were available during my care of the patient were reviewed by me and considered in my medical decision making (see chart for details).   3:17 PM Patient is improved, though she continues to complain  of dyspnea.  She no longer require supplemental oxygen. She has received multiple meds both here and with EMS, including steroids, bronchodilators, Lasix, fentanyl.  Findings thus far consistent with multifactorial respiratory distress, including consideration of COPD/CHF. Given her increased work of breathing, she will require admission for further monitoring, management I discussed this with her internal medicine care team. MDM Rules/Calculators/A&P MDM Number of Diagnoses or Management Options Respiratory distress: new, needed workup   Amount and/or Complexity of Data Reviewed Clinical lab tests: ordered and reviewed Tests in the radiology section of CPT: ordered and reviewed Tests in the medicine section of CPT: reviewed and ordered Decide to obtain previous medical records or to obtain history from someone other than the patient: yes Obtain history from someone other than the patient: yes Review and summarize past medical records: yes Discuss the patient with other providers: yes Independent visualization of images, tracings, or specimens: yes  Risk of Complications, Morbidity, and/or Mortality Presenting problems: high Diagnostic procedures: high Management options: high  Critical Care Total time providing critical care: 30-74 minutes (35)  Patient Progress Patient progress: stable   Final Clinical Impression(s) / ED Diagnoses Final diagnoses:  Respiratory distress     Carmin Muskrat, MD 01/05/21 1519

## 2021-01-05 NOTE — Progress Notes (Addendum)
ANTICOAGULATION CONSULT NOTE - Initial Consult  Pharmacy Consult for Warfarin Indication:  Hx of DVT  Allergies  Allergen Reactions   Acetaminophen-Codeine Itching   Propoxyphene Itching    Darvocet   Tape Rash    Plastic   Tramadol Itching and Nausea And Vomiting    Patient Measurements: Height: 5\' 5"  (165.1 cm) Weight: 83.9 kg (185 lb) IBW/kg (Calculated) : 57  Vital Signs: Temp: 98.8 F (37.1 C) (07/17 0800) Temp Source: Oral (07/17 0800) BP: 150/128 (07/17 1515) Pulse Rate: 83 (07/17 1515)  Labs: Recent Labs    01/05/21 0857 01/05/21 1125  HGB 14.0  --   HCT 43.3  --   PLT 400  --   LABPROT 21.5*  --   INR 1.9*  --   CREATININE  --  0.66    Estimated Creatinine Clearance: 69 mL/min (by C-G formula based on SCr of 0.66 mg/dL).   Medical History: Past Medical History:  Diagnosis Date   Arthritis    Asthma    Breast cancer ([place]) 2001   Left Breast Cancer   CHF (congestive heart failure) (HCC)    COPD (chronic obstructive pulmonary disease) (HCC)    Coronary artery disease    History of mitral valve replacement with mechanical valve    HLD (hyperlipidemia)    Hypertension    Pulmonary embolism (HCC)     Medications:  (Not in a hospital admission)  Scheduled:   Calcium-Phosphorus-Vitamin D  1 tablet Oral Daily   enoxaparin (LOVENOX) injection  40 mg Subcutaneous Q24H   [START ON 01/06/2021] furosemide  40 mg Intravenous Daily   gabapentin  900 mg Oral TID   ipratropium-albuterol  3 mL Nebulization Q6H   lisinopril  20 mg Oral Daily   loratadine  10 mg Oral Daily   metoprolol succinate  25 mg Oral Daily   potassium chloride  40 mEq Oral BID   simvastatin  20 mg Oral Daily   Infusions:   Assessment: 61 yof presenting to the ED with SOB. Warfarin consult for Hx of DVT. INR 1.9  Patient on warfarin takes 5mg  every day except Wednesday take 2.5 mg. Last taken 01/04/21. Of note patient received 40mg  SQ lovenox in ED prior to warfarin orders being  placed.  Goal of Therapy:  INR 2-3 Monitor platelets by anticoagulation protocol: Yes   Plan:  Will give 5mg  dose of warfarin tonight SQ lovenox discontinued Daily INR  Heloise Purpura 01/05/2021,5:07 PM

## 2021-01-05 NOTE — Progress Notes (Signed)
NEW ADMISSION NOTE New Admission Note:   Arrival Method: E.D Stretcher bed Mental Orientation: Alert and oriented x 4 Telemetry:#9 NSR  Assessment: Completed Skin: Intact ,assessed with Durward Mallard IV: LT A/C  Pain:Denies Tubes:None Safety Measures: Safety Fall Prevention Plan has been given, discussed and signed Admission: Completed [facility] Orientation: Patient has been orientated to the room, unit and staff.  Family:None at the bedside.  Orders have been reviewed and implemented. Will continue to monitor the patient. Call light has been placed within reach and bed alarm has been activated. Visitation policy explained.  Borger, Zenon Mayo, RN

## 2021-01-05 NOTE — ED Triage Notes (Addendum)
Pt arrived via [facility] EMS with c/c of shOB. Per EMS pt was awoke around 0300 with shOB with hx of COPD & CHF. Pt took 2 breathing treatments w/o improvements. Endorses chest tightness. Pt had heart value placed 2001. Denies fevers, N/V, Abd pain.  96%RA but in tripod position, 158/100,  1 Duo-Neb given

## 2021-01-05 NOTE — ED Notes (Signed)
Report given to Miguel Dibble, RN

## 2021-01-05 NOTE — ED Notes (Signed)
Minimal lt chest pain at present

## 2021-01-06 ENCOUNTER — Observation Stay (HOSPITAL_COMMUNITY): Payer: Medicare Other

## 2021-01-06 ENCOUNTER — Other Ambulatory Visit: Payer: Self-pay

## 2021-01-06 ENCOUNTER — Encounter (HOSPITAL_COMMUNITY): Payer: Self-pay | Admitting: Internal Medicine

## 2021-01-06 ENCOUNTER — Other Ambulatory Visit: Payer: Medicare Other

## 2021-01-06 DIAGNOSIS — Z952 Presence of prosthetic heart valve: Secondary | ICD-10-CM | POA: Diagnosis not present

## 2021-01-06 DIAGNOSIS — M549 Dorsalgia, unspecified: Secondary | ICD-10-CM | POA: Diagnosis present

## 2021-01-06 DIAGNOSIS — J449 Chronic obstructive pulmonary disease, unspecified: Secondary | ICD-10-CM | POA: Diagnosis not present

## 2021-01-06 DIAGNOSIS — I5042 Chronic combined systolic (congestive) and diastolic (congestive) heart failure: Secondary | ICD-10-CM

## 2021-01-06 DIAGNOSIS — R0609 Other forms of dyspnea: Secondary | ICD-10-CM

## 2021-01-06 DIAGNOSIS — T3695XA Adverse effect of unspecified systemic antibiotic, initial encounter: Secondary | ICD-10-CM | POA: Diagnosis present

## 2021-01-06 DIAGNOSIS — F17210 Nicotine dependence, cigarettes, uncomplicated: Secondary | ICD-10-CM | POA: Diagnosis present

## 2021-01-06 DIAGNOSIS — Z20822 Contact with and (suspected) exposure to covid-19: Secondary | ICD-10-CM | POA: Diagnosis not present

## 2021-01-06 DIAGNOSIS — G8929 Other chronic pain: Secondary | ICD-10-CM | POA: Diagnosis present

## 2021-01-06 DIAGNOSIS — R0603 Acute respiratory distress: Secondary | ICD-10-CM | POA: Diagnosis not present

## 2021-01-06 DIAGNOSIS — Z6830 Body mass index (BMI) 30.0-30.9, adult: Secondary | ICD-10-CM | POA: Diagnosis not present

## 2021-01-06 DIAGNOSIS — I110 Hypertensive heart disease with heart failure: Secondary | ICD-10-CM | POA: Diagnosis not present

## 2021-01-06 DIAGNOSIS — Z7901 Long term (current) use of anticoagulants: Secondary | ICD-10-CM | POA: Diagnosis not present

## 2021-01-06 DIAGNOSIS — I071 Rheumatic tricuspid insufficiency: Secondary | ICD-10-CM | POA: Diagnosis not present

## 2021-01-06 DIAGNOSIS — M25531 Pain in right wrist: Secondary | ICD-10-CM | POA: Diagnosis present

## 2021-01-06 DIAGNOSIS — I10 Essential (primary) hypertension: Secondary | ICD-10-CM | POA: Diagnosis not present

## 2021-01-06 DIAGNOSIS — Z951 Presence of aortocoronary bypass graft: Secondary | ICD-10-CM | POA: Diagnosis not present

## 2021-01-06 DIAGNOSIS — E785 Hyperlipidemia, unspecified: Secondary | ICD-10-CM | POA: Diagnosis present

## 2021-01-06 DIAGNOSIS — E7800 Pure hypercholesterolemia, unspecified: Secondary | ICD-10-CM | POA: Diagnosis not present

## 2021-01-06 DIAGNOSIS — M1990 Unspecified osteoarthritis, unspecified site: Secondary | ICD-10-CM | POA: Diagnosis present

## 2021-01-06 DIAGNOSIS — I5043 Acute on chronic combined systolic (congestive) and diastolic (congestive) heart failure: Secondary | ICD-10-CM | POA: Diagnosis not present

## 2021-01-06 DIAGNOSIS — T501X5A Adverse effect of loop [high-ceiling] diuretics, initial encounter: Secondary | ICD-10-CM | POA: Diagnosis present

## 2021-01-06 DIAGNOSIS — Z79899 Other long term (current) drug therapy: Secondary | ICD-10-CM | POA: Diagnosis not present

## 2021-01-06 DIAGNOSIS — I2510 Atherosclerotic heart disease of native coronary artery without angina pectoris: Secondary | ICD-10-CM | POA: Diagnosis not present

## 2021-01-06 DIAGNOSIS — R0600 Dyspnea, unspecified: Secondary | ICD-10-CM | POA: Diagnosis present

## 2021-01-06 DIAGNOSIS — J441 Chronic obstructive pulmonary disease with (acute) exacerbation: Secondary | ICD-10-CM

## 2021-01-06 DIAGNOSIS — I272 Pulmonary hypertension, unspecified: Secondary | ICD-10-CM | POA: Diagnosis not present

## 2021-01-06 DIAGNOSIS — B373 Candidiasis of vulva and vagina: Secondary | ICD-10-CM | POA: Diagnosis present

## 2021-01-06 DIAGNOSIS — J9601 Acute respiratory failure with hypoxia: Secondary | ICD-10-CM | POA: Diagnosis not present

## 2021-01-06 DIAGNOSIS — E876 Hypokalemia: Secondary | ICD-10-CM | POA: Diagnosis not present

## 2021-01-06 LAB — CBC
HCT: 42.9 % (ref 36.0–46.0)
Hemoglobin: 13.9 g/dL (ref 12.0–15.0)
MCH: 32.6 pg (ref 26.0–34.0)
MCHC: 32.4 g/dL (ref 30.0–36.0)
MCV: 100.5 fL — ABNORMAL HIGH (ref 80.0–100.0)
Platelets: 378 10*3/uL (ref 150–400)
RBC: 4.27 MIL/uL (ref 3.87–5.11)
RDW: 12.1 % (ref 11.5–15.5)
WBC: 12.1 10*3/uL — ABNORMAL HIGH (ref 4.0–10.5)
nRBC: 0 % (ref 0.0–0.2)

## 2021-01-06 LAB — BASIC METABOLIC PANEL
Anion gap: 10 (ref 5–15)
BUN: 16 mg/dL (ref 8–23)
CO2: 23 mmol/L (ref 22–32)
Calcium: 8.7 mg/dL — ABNORMAL LOW (ref 8.9–10.3)
Chloride: 101 mmol/L (ref 98–111)
Creatinine, Ser: 0.93 mg/dL (ref 0.44–1.00)
GFR, Estimated: >60 mL/min (ref >60)
Glucose, Bld: 153 mg/dL — ABNORMAL HIGH (ref 70–99)
Potassium: 2.7 mmol/L — CL (ref 3.5–5.1)
Sodium: 134 mmol/L — ABNORMAL LOW (ref 135–145)

## 2021-01-06 LAB — ECHOCARDIOGRAM COMPLETE
AR max vel: 4.06 cm2
AV Area VTI: 3.94 cm2
AV Area mean vel: 3.93 cm2
AV Mean grad: 3 mmHg
AV Peak grad: 5.4 mmHg
Ao pk vel: 1.16 m/s
Area-P 1/2: 3.39 cm2
Calc EF: 27.9 %
Height: 65 in
MV VTI: 2.52 cm2
S' Lateral: 2.9 cm
Single Plane A2C EF: 20.6 %
Single Plane A4C EF: 27 %
Weight: 2832.47 oz

## 2021-01-06 LAB — PROTIME-INR
INR: 1.6 — ABNORMAL HIGH (ref 0.8–1.2)
Prothrombin Time: 19.4 seconds — ABNORMAL HIGH (ref 11.4–15.2)

## 2021-01-06 LAB — MAGNESIUM: Magnesium: 1.6 mg/dL — ABNORMAL LOW (ref 1.7–2.4)

## 2021-01-06 LAB — TROPONIN I (HIGH SENSITIVITY): Troponin I (High Sensitivity): 12 ng/L (ref <18)

## 2021-01-06 MED ORDER — POTASSIUM CHLORIDE 10 MEQ/100ML IV SOLN
10.0000 meq | INTRAVENOUS | Status: AC
  Administered 2021-01-06 – 2021-01-07 (×4): 10 meq via INTRAVENOUS
  Filled 2021-01-06 (×4): qty 100

## 2021-01-06 MED ORDER — IPRATROPIUM-ALBUTEROL 0.5-2.5 (3) MG/3ML IN SOLN: 3.0000 mL | Freq: Four times a day (QID) | RESPIRATORY_TRACT | Status: DC | PRN

## 2021-01-06 MED ORDER — POTASSIUM CHLORIDE 10 MEQ/100ML IV SOLN
10.0000 meq | INTRAVENOUS | Status: AC
  Administered 2021-01-06 (×2): 10 meq via INTRAVENOUS
  Filled 2021-01-06 (×3): qty 100

## 2021-01-06 MED ORDER — ENOXAPARIN SODIUM 80 MG/0.8ML IJ SOSY
1.0000 mg/kg | PREFILLED_SYRINGE | Freq: Two times a day (BID) | INTRAMUSCULAR | Status: DC
  Administered 2021-01-06 – 2021-01-07 (×3): 80 mg via SUBCUTANEOUS
  Filled 2021-01-06 (×3): qty 0.8

## 2021-01-06 MED ORDER — WARFARIN SODIUM 7.5 MG PO TABS
7.5000 mg | ORAL_TABLET | Freq: Once | ORAL | Status: AC
  Administered 2021-01-06: 7.5 mg via ORAL
  Filled 2021-01-06: qty 1

## 2021-01-06 MED ORDER — FLUCONAZOLE 150 MG PO TABS
150.0000 mg | ORAL_TABLET | Freq: Once | ORAL | Status: AC
  Administered 2021-01-06: 150 mg via ORAL
  Filled 2021-01-06: qty 1

## 2021-01-06 MED ORDER — POTASSIUM CHLORIDE CRYS ER 20 MEQ PO TBCR
40.0000 meq | EXTENDED_RELEASE_TABLET | Freq: Four times a day (QID) | ORAL | Status: AC
  Administered 2021-01-06 (×2): 40 meq via ORAL
  Filled 2021-01-06 (×2): qty 2

## 2021-01-06 MED ORDER — NICOTINE 7 MG/24HR TD PT24
7.0000 mg | MEDICATED_PATCH | Freq: Every day | TRANSDERMAL | Status: DC
  Administered 2021-01-06 – 2021-01-11 (×5): 7 mg via TRANSDERMAL
  Filled 2021-01-06 (×5): qty 1

## 2021-01-06 MED ORDER — POTASSIUM CHLORIDE CRYS ER 20 MEQ PO TBCR: 40.0000 meq | EXTENDED_RELEASE_TABLET | Freq: Two times a day (BID) | ORAL | Status: DC

## 2021-01-06 NOTE — Plan of Care (Signed)
  Problem: Education: Goal: Knowledge of General Education information will improve Description: Including pain rating scale, medication(s)/side effects and non-pharmacologic comfort measures Outcome: Progressing   Problem: Activity: Goal: Risk for activity intolerance will decrease Outcome: Progressing   

## 2021-01-06 NOTE — Progress Notes (Signed)
Patient placed on continuous pulse ox. Provider Gilles Chiquito MD gave parameters 88%- 92% on room air.  Haydee Salter, RN

## 2021-01-06 NOTE — Progress Notes (Signed)
*  PRELIMINARY RESULTS* Echocardiogram 2D Echocardiogram has been performed.  Luisa Hart RDCS 01/06/2021, 11:07 AM

## 2021-01-06 NOTE — Progress Notes (Signed)
  Date: 01/06/2021  Kim Anthony name: Kim Anthony  Medical record number: [id number]  Date of birth: 1951-02-27   I have seen and evaluated Kim Anthony and discussed their care with the Residency Team. Briefly, Kim Anthony is a 70 year old woman with PMH of COPD, HFpEF, HTN who presented for dyspnea and was started on treatment for COPD exacerbation.  She is modestly improved this morning.  TTE was done for further evaluation and found a new HFrEF with EF of 35-40%.  She is on GDMT with ace inhibitor and beta blocker.  She will need further work up to determine cause of CHF.  She is intermittently requiring oxygen and she is being diuresed.  She notes reproducible chest pain at the site of previous surgery for treatment of radiation effects to the skin.   Vitals:   01/06/21 1125 01/06/21 1126  BP:    Pulse:    Resp:    Temp:    SpO2: (!) 86% 94%   Gen: Lying in bed at 30 degree angle, no SOB, coughing, no distress Eyes; Anicteric sclerae HENT: Neck is supple, JVD not assessed CV: RR, NR, no murmur, pedal pulses intact, no peripheral edema Pulm: Crackles at bases, no wheezing Abd: Soft, Obese, NT MSK: Normal tone and bulk Skin: She has a healed wound on the left upper chest with limited underlying subq tissue.  The wound starts at upper left chest and wraps to axilla.  TTP throughout and into the back.  NO rash noted on this area or other areas of exposed skin.   Assessment and Plan: I have seen and evaluated the Kim Anthony as outlined above. I agree with the formulated Assessment and Plan as detailed in the residents' note, with the following changes:   1. Acute hypoxic respiratory failure, mixed picture, new HFrEF, COPD exacerbation, pulmonary edema - Check EKG, consider cardiology consult for LHC (never done) - Trend TnI if develops chest pain - Telemetry - IV lasix for diuresis - Strict I/O, daily weight - Consider transitioning to entresto - COPD treatment with steroids and  azithromycin  2. Hypokalemia - Replace today while on IV lasix - Monitor for improvement  Other issues per MS 3 Kim Anthony's note today.   Sid Falcon, MD 7/18/20223:23 PM

## 2021-01-06 NOTE — Plan of Care (Signed)

## 2021-01-06 NOTE — Progress Notes (Addendum)
ANTICOAGULATION CONSULT NOTE - Initial Consult  Pharmacy Consult for Warfarin, Lovenox Indication:  Hx of DVT  with mechanical valve  Allergies  Allergen Reactions   Acetaminophen-Codeine Itching   Propoxyphene Itching    Darvocet   Tape Rash    Plastic   Tramadol Itching and Nausea And Vomiting    Patient Measurements: Height: 5\' 5"  (165.1 cm) Weight: 80.3 kg (177 lb 0.5 oz) IBW/kg (Calculated) : 57  Vital Signs: Temp: 98.2 F (36.8 C) (07/18 0912) Temp Source: Oral (07/18 0912) BP: 111/68 (07/18 0912) Pulse Rate: 60 (07/18 0912)  Labs: Recent Labs    01/05/21 0857 01/05/21 1125 01/05/21 1957 01/06/21 0415  HGB 14.0  --   --   --   HCT 43.3  --   --   --   PLT 400  --   --   --   LABPROT 21.5*  --   --  19.4*  INR 1.9*  --   --  1.6*  CREATININE  --  0.66  --   --   TROPONINIHS  --   --  11  --      Estimated Creatinine Clearance: 67.5 mL/min (by C-G formula based on SCr of 0.66 mg/dL).   Medical History: Past Medical History:  Diagnosis Date   Arthritis    Asthma    Breast cancer ([place]) 2001   Left Breast Cancer   CHF (congestive heart failure) (HCC)    COPD (chronic obstructive pulmonary disease) (HCC)    Coronary artery disease    History of mitral valve replacement with mechanical valve    HLD (hyperlipidemia)    Hypertension    Pulmonary embolism (HCC)     Medications:  Medications Prior to Admission  Medication Sig Dispense Refill Last Dose   albuterol (PROVENTIL) (2.5 MG/3ML) 0.083% nebulizer solution USE 1 VIAL IN NEBULIZER EVERY 6 HOURS AS NEEDED FOR WHEEZING FOR SHORTNESS OF BREATH (Patient taking differently: Take 2.5 mg by nebulization every 6 (six) hours as needed for wheezing or shortness of breath.) 90 mL 0 01/05/2021   albuterol (VENTOLIN HFA) 108 (90 Base) MCG/ACT inhaler Inhale 1-2 puffs into the lungs every 6 (six) hours as needed for wheezing or shortness of breath. 6.7 g 11 01/05/2021   Calcium-Phosphorus-Vitamin D (CITRACAL +D3  PO) Take 1 tablet by mouth 2 (two) times a day.   Past Week   cetirizine (ZYRTEC ALLERGY) 10 MG tablet Take 1 tablet (10 mg total) by mouth daily. 30 tablet 2 Past Week   fluticasone (FLONASE) 50 MCG/ACT nasal spray Place 1 spray into both nostrils daily as needed for allergies. 16 each 11 01/05/2021   furosemide (LASIX) 40 MG tablet Take 1 tablet by mouth twice daily 60 tablet 2 01/05/2021   gabapentin (NEURONTIN) 600 MG tablet Take 1.5 tablets (900 mg total) by mouth 3 (three) times daily. For nerve pain- increased to get her off lyrica 150 tablet 5 Past Week   HYDROcodone-acetaminophen (NORCO) 5-325 MG tablet Take 1 tablet by mouth every 4 (four) hours as needed for moderate pain. 150 tablet 0 01/05/2021   metoprolol succinate (TOPROL-XL) 25 MG 24 hr tablet Take 1 tablet (25 mg total) by mouth daily. 90 tablet 3 01/05/2021 at 0800   potassium chloride SA (KLOR-CON) 20 MEQ tablet Take 2 tablets by mouth once daily (Patient taking differently: Take 40 mEq by mouth daily.) 60 tablet 0 01/05/2021   simvastatin (ZOCOR) 20 MG tablet Take 1 tablet by mouth once daily 90 tablet  0 01/05/2021   STIOLTO RESPIMAT 2.5-2.5 MCG/ACT AERS INHALE 2 PUFFS BY MOUTH ONCE DAILY (Patient taking differently: Take 2 puffs by mouth daily.) 4 g 0 01/05/2021   warfarin (COUMADIN) 5 MG tablet TAKE 1 TABLET BY MOUTH ONCE DAILY EXCEPT  WEDNESDAYS  TAKE  ONE  HALF  TABLET (Patient taking differently: Take 2.5-5 mg by mouth See admin instructions. Take one (1) tablet of your 5mg  peach-colored warfarin tablets, once-daily at 6PM--EXCEPT on Wednesdays, take ONLY one-half (1/2) tablet on these days.) 30 tablet 0 01/04/2021 at 1800   Spacer/Aero-Holding Chambers (BREATHERITE COLL SPACER ADULT) MISC 1 applicator by Does not apply route daily. 1 each 0    Scheduled:   azithromycin  250 mg Oral Daily   calcium-vitamin D  1 tablet Oral Q breakfast   furosemide  40 mg Intravenous Daily   gabapentin  900 mg Oral TID   ipratropium-albuterol  3 mL  Nebulization BID   lisinopril  20 mg Oral Daily   loratadine  10 mg Oral Daily   metoprolol succinate  25 mg Oral Daily   nicotine  7 mg Transdermal Daily   potassium chloride  40 mEq Oral BID   predniSONE  40 mg Oral Q breakfast   simvastatin  20 mg Oral Daily   Warfarin - Pharmacist Dosing Inpatient   Does not apply q1600   Infusions:   sodium chloride Stopped (01/05/21 2309)    Assessment: 13 yof presenting to the ED with SOB. Warfarin consult for Hx of DVT in setting of mechanical valve. INR below goal at 1.6 (from 1.9) - discussed with Dr. Laural Golden to bridge with Lovenox until at goal for mechanical valve.  Patient on warfarin takes 5mg  every day except Wednesday take 2.5 mg.   Goal of Therapy:  INR goal 2.5-3.5 Monitor platelets by anticoagulation protocol: Yes   Plan:  Increase 7.5mg  warfarin x 1 today Bridge with Lovenox 80 mg SQ q12h until INR at goal (2.5-3.5) Daily INR

## 2021-01-06 NOTE — Progress Notes (Signed)
   Subjective:  Kim Anthony is a 70 y/o female with COPD, CHF, HTN, MV repair and prior PE now on chronic warfarin who is admitted for COPD exacerbation.   Kim Anthony notes that she is feeling better overall than she did yesterday. She notes that she feels less SOB and that her cough has improved some. She continues to endorse a productive cough. She has been O2 sating between upper 80s to mid 90s between RA and 2L [place] intermittently. During conversation without O2, Kim Anthony noted she did not feel worse SOB.  Kim Anthony also notes that she has a vaginal yeast infection, and notes this is consistent with her typical pattern when she takes antibiotics.   Objective:  Vital signs in last 24 hours: Vitals:   01/06/21 0912 01/06/21 1100 01/06/21 1125 01/06/21 1126  BP: 111/68     Pulse: 60     Resp: 16     Temp: 98.2 F (36.8 C)     TempSrc: Oral     SpO2: 96% 91% (!) 86% 94%  Weight:      Height:       Weight change:   Intake/Output Summary (Last 24 hours) at 01/06/2021 1319 Last data filed at 01/06/2021 0800 Gross per 24 hour  Intake 531.88 ml  Output 0 ml  Net 531.88 ml   General: Pleasant, well-appearing elderly woman laying comfortably in bed. No acute distress. Head: Normocephalic. Atraumatic. CV: RRR. No murmurs, rubs, or gallops. No LE edema Pulmonary: Inspiratory crackles at lung bases. Normal effort. No wheezing.  Abdominal: Soft, nontender, nondistended. Normal bowel sounds. Extremities: Palpable radial and DP pulses. Normal ROM. Skin: Warm and dry. Extensive scar over left chest wall from prior L mastectomy Neuro: A&Ox3. Moves all extremities. Normal sensation. No focal deficit. Psych: Normal mood and affect   Assessment/Plan:  Principal Problem:   COPD exacerbation (HCC) Active Problems:   Chronic combined systolic and diastolic congestive heart failure (HCC)   Dyspnea  COPD exacerbation Felt more likely to be COPD than CHF exacerbation given presence of cardinal  criteria of increased dyspnea, phlegm, and cough on admission. Initial CXR showed diffusely increased interstitial markings bilaterally. Responding well to abx, steroids, O2, and Duonebs. Remains afebrile. -Goal of 90-93% O2 sat w/ 1-2L [place]. -Continue 250mg  Azithromycin daily -Continue 40mg  Prednisone daily -Continue 74mL Duonebs TID  Vaginal yeast infection Typical symptoms for this patient with previous vaginal yeast infections on abx. -150mg  Fluconazole  CHF 01/06/21 ECHO showed EF down to 35-40% from prior 01/19/21 EF of 55-60%. RV size is mildly enlarged w/ mildly elevated pulmonary artery systolic pressure and an estimated RV systolic pressure elevated at 36.4. 01/06/21 BNP slightly elevated at 209. Signs consistent with HFrEF Pt is already being diuresed and will continue. Also already takes ACE and Beta blocker. Creatinine stable at 0.93 with GFR>60. -Continue IV 40mg  Lasix daily -Monitoring strict I/O and daily weight  Hypokalemia K at 2.7 today. Likely due to IV Lasix. On PO 40mg  Potassium BID. -Adding IV 10mg  Potassium x6 doses  HTN Well controlled today between 111-129/68-77. -Continue 25mg  Toprol-XL daily -Continue 20mg  Lisinopril   MV Replacement and Prior PE On chronic anticoagulation with 5mg  Warfarin daily except Wednesdays 2.5mg . INR subtherapeutic at 1.6 today. -Pharmacy managing anticoagulation of Warfarin and bridging w/ Lovenox  Chronic back and R wrist pain -Continue home medications of Norco 5-325, and 900mg  Gabapentin TID    LOS: 0 days   Baldwin Jamaica A, Medical Student 01/06/2021, 1:19 PM

## 2021-01-06 NOTE — Progress Notes (Signed)
Results for MORIYAH, BYINGTON ([name] [id number]) as of 01/06/2021 11:18  Ref. Range 01/06/2021 09:47  Potassium Latest Ref Range: 3.5 - 5.1 mmol/L 2.7 (LL)    MD notified and will order replacement.    Haydee Salter, RN

## 2021-01-06 NOTE — Hospital Course (Signed)
Ms Jauregui is a 70 y/o female w/ COPD, CHF, hx of PE and mitral valve replacement, admitted for COPD exacerbation.  Ms Hogans presented with 3 days of increased dyspnea, cough, and phlegm production while being afebrile. Her initial CXR showed diffuse b/l interstitial markings. She began Prednisone, Azithromycin and Duonebs on admission, 01/05/21.  01/06/21 ECHO showed EF of 35-40%, reduced from prior 01/20/19 study showing EF 55-60%. Treated with IV Lasix and Potassium replacement.

## 2021-01-06 NOTE — Progress Notes (Signed)
Arrived to patient's room . Patient was up in chair. Secure chatted nurse to re-consult when pt. Is back in be. Fran Lowes, RN VAST

## 2021-01-07 ENCOUNTER — Telehealth: Payer: Medicare Other

## 2021-01-07 ENCOUNTER — Encounter (HOSPITAL_COMMUNITY): Payer: Self-pay | Admitting: Internal Medicine

## 2021-01-07 DIAGNOSIS — J441 Chronic obstructive pulmonary disease with (acute) exacerbation: Secondary | ICD-10-CM | POA: Diagnosis not present

## 2021-01-07 DIAGNOSIS — I5043 Acute on chronic combined systolic (congestive) and diastolic (congestive) heart failure: Secondary | ICD-10-CM | POA: Diagnosis not present

## 2021-01-07 LAB — BASIC METABOLIC PANEL
Anion gap: 7 (ref 5–15)
BUN: 17 mg/dL (ref 8–23)
CO2: 19 mmol/L — ABNORMAL LOW (ref 22–32)
Calcium: 8.9 mg/dL (ref 8.9–10.3)
Chloride: 110 mmol/L (ref 98–111)
Creatinine, Ser: 0.69 mg/dL (ref 0.44–1.00)
GFR, Estimated: >60 mL/min (ref >60)
Glucose, Bld: 84 mg/dL (ref 70–99)
Potassium: 4.7 mmol/L (ref 3.5–5.1)
Sodium: 136 mmol/L (ref 135–145)

## 2021-01-07 LAB — CBC
HCT: 43 % (ref 36.0–46.0)
Hemoglobin: 13.7 g/dL (ref 12.0–15.0)
MCH: 33.3 pg (ref 26.0–34.0)
MCHC: 31.9 g/dL (ref 30.0–36.0)
MCV: 104.6 fL — ABNORMAL HIGH (ref 80.0–100.0)
Platelets: 425 10*3/uL — ABNORMAL HIGH (ref 150–400)
RBC: 4.11 MIL/uL (ref 3.87–5.11)
RDW: 12.2 % (ref 11.5–15.5)
WBC: 10.7 10*3/uL — ABNORMAL HIGH (ref 4.0–10.5)
nRBC: 0 % (ref 0.0–0.2)

## 2021-01-07 LAB — T4, FREE: Free T4: 1.22 ng/dL — ABNORMAL HIGH (ref 0.61–1.12)

## 2021-01-07 LAB — PROTIME-INR
INR: 1.9 — ABNORMAL HIGH (ref 0.8–1.2)
Prothrombin Time: 22.1 seconds — ABNORMAL HIGH (ref 11.4–15.2)

## 2021-01-07 LAB — MAGNESIUM: Magnesium: 3.3 mg/dL — ABNORMAL HIGH (ref 1.7–2.4)

## 2021-01-07 LAB — TSH: TSH: 2.775 u[IU]/mL (ref 0.350–4.500)

## 2021-01-07 MED ORDER — FUROSEMIDE 80 MG PO TABS: 80.0000 mg | ORAL_TABLET | Freq: Two times a day (BID) | ORAL | Status: DC

## 2021-01-07 MED ORDER — AZITHROMYCIN 500 MG PO TABS
250.0000 mg | ORAL_TABLET | Freq: Every day | ORAL | Status: AC
  Administered 2021-01-08: 250 mg via ORAL
  Filled 2021-01-07: qty 1

## 2021-01-07 MED ORDER — HEPARIN (PORCINE) 25000 UT/250ML-% IV SOLN
900.0000 [IU]/h | INTRAVENOUS | Status: DC
  Administered 2021-01-07 – 2021-01-08 (×2): 900 [IU]/h via INTRAVENOUS
  Filled 2021-01-07 (×2): qty 250

## 2021-01-07 MED ORDER — ATORVASTATIN CALCIUM 40 MG PO TABS
40.0000 mg | ORAL_TABLET | Freq: Every day | ORAL | Status: DC
  Administered 2021-01-08 – 2021-01-09 (×2): 40 mg via ORAL
  Filled 2021-01-07 (×3): qty 1

## 2021-01-07 MED ORDER — ENSURE ENLIVE PO LIQD
237.0000 mL | Freq: Once | ORAL | Status: AC
  Administered 2021-01-07: 237 mL via ORAL

## 2021-01-07 MED ORDER — FUROSEMIDE 80 MG PO TABS
80.0000 mg | ORAL_TABLET | Freq: Two times a day (BID) | ORAL | Status: DC
  Administered 2021-01-07 – 2021-01-10 (×5): 80 mg via ORAL
  Filled 2021-01-07 (×6): qty 1

## 2021-01-07 MED ORDER — WARFARIN SODIUM 7.5 MG PO TABS: 7.5000 mg | ORAL_TABLET | Freq: Once | ORAL | Status: DC

## 2021-01-07 MED ORDER — MAGNESIUM SULFATE 4 GM/100ML IV SOLN
4.0000 g | Freq: Once | INTRAVENOUS | Status: AC
  Administered 2021-01-07: 4 g via INTRAVENOUS
  Filled 2021-01-07: qty 100

## 2021-01-07 NOTE — Plan of Care (Signed)
  Problem: Education: Goal: Knowledge of General Education information will improve Description: Including pain rating scale, medication(s)/side effects and non-pharmacologic comfort measures Outcome: Progressing   Problem: Clinical Measurements: Goal: Ability to maintain clinical measurements within normal limits will improve Outcome: Progressing Goal: Will remain free from infection Outcome: Progressing Goal: Cardiovascular complication will be avoided Outcome: Progressing   Problem: Coping: Goal: Level of anxiety will decrease Outcome: Progressing   

## 2021-01-07 NOTE — Progress Notes (Signed)
   Subjective:  Kim Anthony is a 70 y/o female with COPD, CHF, HTN, prior mitral valve replacement and hx of DVT/PE on chronic warfarin, admitted for management and workup of COPD exacerbation and acute on chronic CHF.   Kim Anthony note that she is continuing to feel better today. She has continued on abx, prednisone and O2. She endorses improved cough, dyspnea and phlegm production.   She denies any leg swelling and has not developed any new symptoms or concerns.   Objective:  Vital signs in last 24 hours: Vitals:   01/06/21 2335 01/07/21 0625 01/07/21 0635 01/07/21 0840  BP: (!) 115/91 114/71  (!) 141/92  Pulse: 72 69  71  Resp: 18 16  20   Temp: 98.4 F (36.9 C) (!) 97.5 F (36.4 C)  (!) 97.5 F (36.4 C)  TempSrc: Oral Oral  Oral  SpO2: 98% 95%  97%  Weight:   81.6 kg   Height:       Weight change: -2.313 kg  Intake/Output Summary (Last 24 hours) at 01/07/2021 1506 Last data filed at 01/07/2021 1243 Gross per 24 hour  Intake 820 ml  Output 400 ml  Net 420 ml   General: Pleasant, well-appearing woman laying comfortably in bed. No acute distress. Head: Normocephalic. Atraumatic. CV: RRR. No murmurs, rubs, or gallops. No LE edema Pulmonary: Early inspiratory crackles at b/l bases. Normal effort. No wheezing. Abdominal: Soft, nontender, nondistended. Normal bowel sounds. Extremities: Palpable radial and DP pulses. Normal ROM. Skin: Warm and dry. No obvious rash or lesions. Neuro: A&Ox3. Moves all extremities. Normal sensation. No focal deficit. Psych: Normal mood and affect   Assessment/Plan:  Principal Problem:   COPD exacerbation (HCC) Active Problems:   Chronic combined systolic and diastolic congestive heart failure (HCC)   Dyspnea   Respiratory distress  COPD Exacerbation Presented with all cardinal criteria of COPD exacerbation w/ increased dyspnea, phlegm and cough on admission. Initial CXR showed diffusely interstitial marking bilaterally. Responded well to  guideline based therapy, but could be symptomatically responding well due to diuresis secondary to concern of simultaneous vs isolated CHF exacerbation. -Continue last day of 250mg  Azithromycin tomorrow, 7/19 -Continue last day of 40mg  Prednisone tomorrow -Continue 19mL Duonebs q6 prn  CHF Exacerbation 01/06/21 ECHO showed EF down to 35-40% from prior 01/20/19 EF of 55-60%. 01/06/21 BNP slightly elevated at 209. Pt was previously only aware of her MV replacement, but review of her 02/16/20 CTA Chest showed evidence of a prior CABG that the pt was unaware of, as well as extensive calcification in the native coronary arteries. EKG today not yet released. TSH was WNL. Current concern is for ischemic heart failure and therefore consulted Heart Failure group, who are now planning to cath pt tomorrow AM.  -Continue 80mg  Furosemide BID -Monitoring strict I/O and daily weight -Start 12.5mg  Spironolactone as per HF.  Hypokalemia K better at 4.7 today. Currently on Furosemide.  -Repleting as necessary   HTN Fairly controlled today between 114-141/71-92 -Continue 25mg  Toprol-XL daily -Continue 20mg  Lisinopril  -Starting Arlyce Harman as above  MV Replacement and Prior PE On chronic anticoagulation with 5mg  Warfarin daily except Wednesdays 2.5mg .  -Pharmacy managing anticoagulation of Warfarin  Chronic back and R wrist pain -Continue home meds of Norco 5-325, and 900mg  Gabapentin TID.    LOS: 1 day   London Pepper, Medical Student 01/07/2021, 3:06 PM

## 2021-01-07 NOTE — Consult Note (Addendum)
Advanced Heart Failure Team Consult Note   Primary Physician: Marianna Payment, MD PCP-Cardiologist:  None  Reason for Consultation: Acute on chronic combined CHF  HPI:    Cintya Daughety is seen today for evaluation of acute on chronic combined CHF/decline in LV function at the request of Dr. Daryll Drown with Internal Medicine. Has history of mechanical MVR in 2001 in [place], [place], hx breast cancer s/p left mastectomy and radiation/chemo, tobacco use, COPD, prior PE/DVT, HTN, undergoing workup for possible inflammatory arthritis with rheum.  She is unaware of prior CAD history, but surgical changes consistent w/ prior CABG noted on CTA chest August 2021.  Also has known history of CHF. EF previously 45-50% in 2017 (no EF on file prior to this). Had improved to 55-60% on echo July 2020. Has not followed with a cardiologist regularly.  Patient presented to the ED via EMS morning of 07/17 with complaints of progressive dyspnea X 3 days associated with wheezing, cough and sinus congestion. Reports daughter had been sick with a "cold". Denies fever or chills. Did not improve with home breathing treatments. No PND. + orthopnea. No recent weight gain. + chest wall tenderness with palpation. She was in respiratory distress. Labs significant for BNP 209, K 3.1, COVID negative. Chest x-ray consistent with CHF versus atypical/viral pneumonia. Received steroids, bronchodilators, and 40 mg lasix IV. Had good output. She was admitted for additional workup and management of COPD exacerbation and acute on chronic diastolic CHF.  Has been treated with duonebs, antibiotics and steroids for COPD exacerbation. Intermittently requiring supplemental 02. Diuresed with IV lasix. Weight down 6lb since admission. Is/Os not accurate. Echo with EF of 35-40%, previously 55-60% on echo July 2020. No significant MR. Mildly enlarged RV.   Social history: Lives with daughter. Independent in ADLs. Continues to drive. Retired. Consumes 4  alcoholic beverages weekly, longstanding cigarette smoker. No illicit drug use.    CARDIAC STUDIES: Echo 07/18 demonstrated EF of 35-40%, no LVH, mildly enlarged RV with with normal RV systolic function, RVSP 36 mmHg, mean gradient of 4 mmHg across mitral valve prosthesis, no MR, moderate TR   Review of Systems: [y] = yes, [ ]  = no   General: Weight gain [ ] ; Weight loss [ ] ; Anorexia [ ] ; Fatigue [ ] ; Fever [ ] ; Chills [ ] ; Weakness [ ]   Cardiac: Chest pain/pressure [ ] ; Resting SOB [Y]; Exertional SOB [Y]; Orthopnea [Y]; Pedal Edema [ ] ; Palpitations [ ] ; Syncope [ ] ; Presyncope [ ] ; Paroxysmal nocturnal dyspnea[ ]   Pulmonary: Cough [Y]; Wheezing[Y]; Hemoptysis[ ] ; Sputum [Y]; Snoring [ ]   GI: Vomiting[ ] ; Dysphagia[ ] ; Melena[ ] ; Hematochezia [ ] ; Heartburn[ ] ; Abdominal pain [ ] ; Constipation [ ] ; Diarrhea with antibiotics[Y]; BRBPR [ ]   GU: Hematuria[ ] ; Dysuria [ ] ; Nocturia[ ]   Vascular: Pain in legs with walking [ ] ; Pain in feet with lying flat [ ] ; Non-healing sores [ ] ; Stroke [ ] ; TIA [ ] ; Slurred speech [ ] ;  Neuro: Headaches[ ] ; Vertigo[ ] ; Seizures[ ] ; Paresthesias[ ] ;Blurred vision [ ] ; Diplopia [ ] ; Vision changes [ ]   Ortho/Skin: Arthritis [ ] ; Joint pain [ ] ; Muscle pain [ ] ; Joint swelling [ ] ; Back Pain [ ] ; Rash [ ]   Psych: Depression[ ] ; Anxiety[ ]   Heme: Bleeding problems [ ] ; Clotting disorders [ ] ; Anemia [ ]   Endocrine: Diabetes [ ] ; Thyroid dysfunction[ ]   Home Medications Prior to Admission medications   Medication Sig Start Date End Date Taking? Authorizing Provider  albuterol (PROVENTIL) (2.5 MG/3ML) 0.083% nebulizer solution USE 1 VIAL IN NEBULIZER EVERY 6 HOURS AS NEEDED FOR WHEEZING FOR SHORTNESS OF BREATH Patient taking differently: Take 2.5 mg by nebulization every 6 (six) hours as needed for wheezing or shortness of breath. 11/27/20  Yes Cato Mulligan, MD  albuterol (VENTOLIN HFA) 108 (90 Base) MCG/ACT inhaler Inhale 1-2 puffs into the lungs every 6  (six) hours as needed for wheezing or shortness of breath. 08/26/20  Yes Seawell, Jaimie A, DO  Calcium-Phosphorus-Vitamin D (CITRACAL +D3 PO) Take 1 tablet by mouth 2 (two) times a day.   Yes [provider]  cetirizine (ZYRTEC ALLERGY) 10 MG tablet Take 1 tablet (10 mg total) by mouth daily. 08/26/20 08/26/21 Yes Seawell, Jaimie A, DO  fluticasone (FLONASE) 50 MCG/ACT nasal spray Place 1 spray into both nostrils daily as needed for allergies. 08/26/20 08/26/21 Yes Seawell, Jaimie A, DO  furosemide (LASIX) 40 MG tablet Take 1 tablet by mouth twice daily 12/25/20  Yes Sanjuan Dame, MD  gabapentin (NEURONTIN) 600 MG tablet Take 1.5 tablets (900 mg total) by mouth 3 (three) times daily. For nerve pain- increased to get her off lyrica 09/30/20  Yes Lovorn, Jinny Blossom, MD  HYDROcodone-acetaminophen (NORCO) 5-325 MG tablet Take 1 tablet by mouth every 4 (four) hours as needed for moderate pain. 12/12/20  Yes Lovorn, Jinny Blossom, MD  metoprolol succinate (TOPROL-XL) 25 MG 24 hr tablet Take 1 tablet (25 mg total) by mouth daily. 02/22/20  Yes Cato Mulligan, MD  potassium chloride SA (KLOR-CON) 20 MEQ tablet Take 2 tablets by mouth once daily Patient taking differently: Take 40 mEq by mouth daily. 12/03/20  Yes Maudie Mercury, MD  simvastatin (ZOCOR) 20 MG tablet Take 1 tablet by mouth once daily 10/28/20  Yes Asencion Noble, MD  STIOLTO RESPIMAT 2.5-2.5 MCG/ACT AERS INHALE 2 PUFFS BY MOUTH ONCE DAILY Patient taking differently: Take 2 puffs by mouth daily. 12/20/20  Yes Marianna Payment, MD  warfarin (COUMADIN) 5 MG tablet TAKE 1 TABLET BY MOUTH ONCE DAILY EXCEPT  WEDNESDAYS  TAKE  ONE  HALF  TABLET Patient taking differently: Take 2.5-5 mg by mouth See admin instructions. Take one (1) tablet of your 5mg  peach-colored warfarin tablets, once-daily at 6PM--EXCEPT on Wednesdays, take ONLY one-half (1/2) tablet on these days. 12/16/20  Yes Maudie Mercury, MD  Spacer/Aero-Holding Chambers (BREATHERITE COLL SPACER ADULT) MISC  1 applicator by Does not apply route daily. 01/11/20   Delice Bison, DO    Past Medical History: Past Medical History:  Diagnosis Date   Arthritis    Asthma    Breast cancer ([facility]) 2001   Left Breast Cancer   CHF (congestive heart failure) ([facility])    COPD (chronic obstructive pulmonary disease) ([facility])    Coronary artery disease    History of mitral valve replacement with mechanical valve    HLD (hyperlipidemia)    Hypertension    Pulmonary embolism ([facility])     Past Surgical History: Past Surgical History:  Procedure Laterality Date   APPLICATION OF A-CELL OF CHEST/ABDOMEN Left 01/27/2019   Procedure: APPLICATION OF A-CELL OF CHEST;  Surgeon: Wallace Going, DO;  Location: [facility];  Service: Plastics;  Laterality: Left;   APPLICATION OF WOUND VAC N/A 01/27/2019   Procedure: APPLICATION OF WOUND VAC;  Surgeon: Wallace Going, DO;  Location: [facility];  Service: Plastics;  Laterality: N/A;   COLONOSCOPY WITH PROPOFOL N/A 06/07/2018   Procedure: COLONOSCOPY WITH PROPOFOL;  Surgeon: Wilford Corner, MD;  Location: [facility];  Service: Endoscopy;  Laterality: N/A;   CORONARY ARTERY BYPASS GRAFT     DEBRIDEMENT AND CLOSURE WOUND Left 12/28/2018   Procedure: Debridement And Closure Wound;  Surgeon: Coralie Keens, MD;  Location: [facility];  Service: General;  Laterality: Left;   INCISION AND DRAINAGE OF WOUND Left 01/27/2019   Procedure: IRRIGATION AND DEBRIDEMENT OF CHEST WALL;  Surgeon: Wallace Going, DO;  Location: [facility];  Service: Plastics;  Laterality: Left;   LATISSIMUS FLAP TO BREAST Left 01/18/2019   Procedure: LEFT LATISSIMUS FLAP TO BREAST;  Surgeon: Wallace Going, DO;  Location: [facility];  Service: Plastics;  Laterality: Left;   MASTECTOMY Left 2000s   for breast cancer, also s/p radiation and chemo   MITRAL VALVE REPLACEMENT     mechanical   POLYPECTOMY  06/07/2018   Procedure: POLYPECTOMY;  Surgeon: Wilford Corner, MD;  Location: [facility];   Service: Endoscopy;;   WOUND DEBRIDEMENT Left 12/28/2018   Procedure: EXCISION OF LEFT CHRONIC CHEST WALL WOUND;  Surgeon: Coralie Keens, MD;  Location: [facility];  Service: General;  Laterality: Left;    Family History: Family History  Problem Relation Age of Onset   Heart attack Other    Breast cancer Maternal Aunt 41    Social History: Social History   Socioeconomic History   Marital status: Single    Spouse name: Not on file   Number of children: Not on file   Years of education: Not on file   Highest education level: Not on file  Occupational History   Not on file  Tobacco Use   Smoking status: Every Day    Packs/day: 0.25    Years: 30.00    Pack years: 7.50    Types: Cigarettes   Smokeless tobacco: Never   Tobacco comments:    Sometimes less. 4 per day  Vaping Use   Vaping Use: Never used  Substance and Sexual Activity   Alcohol use: Yes    Comment: beer- ocasional   Drug use: No   Sexual activity: Not Currently    Birth control/protection: None  Other Topics Concern   Not on file  Social History Narrative   ** Merged History Encounter **       Social Determinants of Health   Financial Resource Strain: Not on file  Food Insecurity: Not on file  Transportation Needs: Not on file  Physical Activity: Not on file  Stress: Not on file  Social Connections: Not on file    Allergies:  Allergies  Allergen Reactions   Acetaminophen-Codeine Itching   Propoxyphene Itching    Darvocet   Tape Rash    Plastic   Tramadol Itching and Nausea And Vomiting    Objective:    Vital Signs:   Temp:  [97.5 F (36.4 C)-98.4 F (36.9 C)] 97.5 F (36.4 C) (07/19 0840) Pulse Rate:  [69-74] 71 (07/19 0840) Resp:  [16-20] 20 (07/19 0840) BP: (114-141)/(71-92) 141/92 (07/19 0840) SpO2:  [93 %-98 %] 97 % (07/19 0840) Weight:  [81.6 kg] 81.6 kg (07/19 0635) Last BM Date: 01/07/21  Weight change: Filed Weights   01/05/21 0800 01/05/21 1841 01/07/21 0635  Weight:  83.9 kg 80.3 kg 81.6 kg    Intake/Output:   Intake/Output Summary (Last 24 hours) at 01/07/2021 1126 Last data filed at 01/07/2021 0223 Gross per 24 hour  Intake 480 ml  Output 400 ml  Net 80 ml      Physical Exam    General:  Well  appearing, African American female. No distress.  HEENT: normal Neck: supple. No JVD. Carotids 2+ bilat; no bruits. No lymphadenopathy or thyromegaly appreciated. Cor: PMI nondisplaced. S1, crisp mechanical S2. Regular rate & rhythm.  Lungs: Diminished breath sounds throughout Abdomen: soft, nontender, nondistended. No hepatosplenomegaly. No bruits or masses. Good bowel sounds. Extremities: no cyanosis, clubbing, rash, edema Neuro: alert & orientedx3, cranial nerves grossly intact. moves all 4 extremities w/o difficulty. Affect pleasant   Telemetry   NSR. Rates 42s. Personally reviewed.  EKG    NSR rate 69 bpm, QRS 122 ms, [facility]  Labs   Basic Metabolic Panel: Recent Labs  Lab 01/05/21 1125 01/06/21 0947 01/06/21 2233 01/07/21 0727  NA 140 134*  --  136  K 3.1* 2.7*  --  4.7  CL 101 101  --  110  CO2 25 23  --  19*  GLUCOSE 96 153*  --  84  BUN 6* 16  --  17  CREATININE 0.66 0.93  --  0.69  CALCIUM 9.1 8.7*  --  8.9  MG  --   --  1.6* 3.3*    Liver Function Tests: Recent Labs  Lab 01/05/21 1125  AST 53*  ALT 23  ALKPHOS 111  BILITOT 0.7  PROT 7.8  ALBUMIN 3.6   No results for input(s): LIPASE, AMYLASE in the last 168 hours. No results for input(s): AMMONIA in the last 168 hours.  CBC: Recent Labs  Lab 01/05/21 0857 01/06/21 0947 01/07/21 0727  WBC 8.5 12.1* 10.7*  NEUTROABS 5.8  --   --   HGB 14.0 13.9 13.7  HCT 43.3 42.9 43.0  MCV 101.9* 100.5* 104.6*  PLT 400 378 425*    Cardiac Enzymes: No results for input(s): CKTOTAL, CKMB, CKMBINDEX, TROPONINI in the last 168 hours.  BNP: BNP (last 3 results) Recent Labs    02/09/20 1145 08/26/20 1442 01/05/21 0857  BNP 131.5* 277.3* 209.8*    ProBNP (last 3  results) No results for input(s): PROBNP in the last 8760 hours.   CBG: No results for input(s): GLUCAP in the last 168 hours.  Coagulation Studies: Recent Labs    01/05/21 0857 01/06/21 0415 01/07/21 0727  LABPROT 21.5* 19.4* 22.1*  INR 1.9* 1.6* 1.9*     Imaging   No results found.   Medications:     Current Medications:  azithromycin  250 mg Oral Daily   calcium-vitamin D  1 tablet Oral Q breakfast   enoxaparin (LOVENOX) injection  1 mg/kg Subcutaneous Q12H   furosemide  40 mg Intravenous Daily   gabapentin  900 mg Oral TID   loratadine  10 mg Oral Daily   metoprolol succinate  25 mg Oral Daily   nicotine  7 mg Transdermal Daily   predniSONE  40 mg Oral Q breakfast   simvastatin  20 mg Oral Daily   warfarin  7.5 mg Oral ONCE-1600   Warfarin - Pharmacist Dosing Inpatient   Does not apply q1600    Infusions:  sodium chloride Stopped (01/05/21 2309)      Assessment/Plan  Acute on chronic combined CHF: -Admit with 3 days of worsening dyspnea, wheezing and cough -NYHA class IV on admit -Chest x-ray with evidence of CHF -Diuresed with IV lasix and transitioned to 80 mg po BID today -Weight down 6 lb, 179.9 lb today. Is/Os not accurate. Does not appear overloaded today. Agree with po furosemide. -BNP 209 (277 4 mo ago) -EF 35-40% this admit, previously 55-60% in 2020. EF 45-50%  in 2017 in setting of PE .  -Etiology for decline in EF not certain. Need to rule out obstructive CAD. Surgical changes suggestive of prior CABG (patient unaware of prior CABG) and extensive calcification in native coronaries noted on CT chest from August 2021.  Plan for [facility] tomorrow am. If no significant CAD on LHC, may need cardiac MRI to better assess etiology of CM. Viral myocarditis a consideration. Had cold-like symptoms and sick contact 2 weeks ago.No arrhythmias on telemetry. HS trop neg X 2.  -On metoprolol xl 25 mg daily -Had been on ACE-I previously. Stopped after seen in ED  in April 2022 with facial swelling and possible angioedema. Unable to use ARNI. -Add spiro 12.5 mg daily. Monitor K. 4.7 after supplemented. -CR  2. Acute respiratory failure with hypoxia: -Probably secondary to combination of acute on chronic CHF and COPD exacerbation -Intermittently required 2L 02 [place] -COPD exacerbation treated by primary team  3. Hx of mechanical mitral valve replacement: -Mechanical MVR in [place] in 2001 -mean gradient of 4 mmHg with no significant MR on echo this admit -Anticoagulated with warfarin per pharmacy.  -INR 1.9. Hold warfarin in anticipation of cath. Heparin.    4. Tricuspid valve regurgitation: -Moderate in severity on echo  5. Hx DVT/PE: -On warfarin  6. HTN: -Variable blood pressures. Above goal this am. Arlyce Harman added as above.  7. Smoker: -Smoking cessation discussed -wearing patch  8. Hyperlipidemia: -On Simvastatin.  -Intensity statin therapy. Switched to 40 mg atorvastatin.    SDOH:  -No transportation needs. Lives independently. -Has Medicare.  No difficulty obtaining meds. Will review HF meds with pharmacist.  Length of Stay: 1  FINCH, LINDSAY N, PA-C  01/07/2021, 11:26 AM  Advanced Heart Failure Team Pager [phone] (M-F; 7a - 5p)  Please contact [facility] for night-coverage after hours (4p -7a ) and weekends on [email or web address]   Patient seen and examined with the above-signed Advanced Practice Provider and/or Housestaff. I personally reviewed laboratory data, imaging studies and relevant notes. I independently examined the patient and formulated the important aspects of the plan. I have edited the note to reflect any of my changes or salient points. I have personally discussed the plan with the patient and/or family.  70 y/o woman with morbid obesity, tobacco use, HTN, DVT/PE, mechanical MVR (2001). Admitted with acute HF - NYHA IV  Echo read as EF 35-40% (previously 45-50%). I felt EF closer to 40-45% with moderate RV  dysfunction. Previous chest CT with extensive coronary calcifications. There is no evidence of CABG on CXR or CT.   General:  Obese woman No resp difficulty HEENT: normal Neck: supple. JVP to ear  Carotids 2+ bilat; no bruits. No lymphadenopathy or thryomegaly appreciated. Cor: PMI nondisplaced. Regular rate & rhythm. Mechanical s1 Lungs: clear Abdomen: obese soft, nontender, nondistended. No hepatosplenomegaly. No bruits or masses. Good bowel sounds. Extremities: no cyanosis, clubbing, rash, edema Neuro: alert & orientedx3, cranial nerves grossly intact. moves all 4 extremities w/o difficulty. Affect pleasant  Echo and previous chart notes viewed personally. EF appears mildly down from previous but also appears to have significant RV dysfunction in setting of previous MVR. Given echo findings and severity of symptoms will proceed with L/R cath tomorrow.   Will need IV heparin when off warfarin.   Glori Bickers, MD  6:14 PM

## 2021-01-07 NOTE — Progress Notes (Signed)
ANTICOAGULATION CONSULT NOTE - Initial Consult  Pharmacy Consult for Warfarin, Lovenox > heparin Indication:  Hx of DVT  with mechanical valve  Allergies  Allergen Reactions   Acetaminophen-Codeine Itching   Propoxyphene Itching    Darvocet   Tape Rash    Plastic   Tramadol Itching and Nausea And Vomiting    Patient Measurements: Height: 5\' 5"  (165.1 cm) Weight: 81.6 kg (179 lb 14.4 oz) IBW/kg (Calculated) : 57  Vital Signs: Temp: 97.5 F (36.4 C) (07/19 0840) Temp Source: Oral (07/19 0840) BP: 141/92 (07/19 0840) Pulse Rate: 71 (07/19 0840)  Labs: Recent Labs    01/05/21 0857 01/05/21 1125 01/05/21 1957 01/06/21 0415 01/06/21 0947 01/06/21 1547 01/07/21 0727  HGB 14.0  --   --   --  13.9  --  13.7  HCT 43.3  --   --   --  42.9  --  43.0  PLT 400  --   --   --  378  --  425*  LABPROT 21.5*  --   --  19.4*  --   --  22.1*  INR 1.9*  --   --  1.6*  --   --  1.9*  CREATININE  --  0.66  --   --  0.93  --  0.69  TROPONINIHS  --   --  11  --   --  12  --      Estimated Creatinine Clearance: 68 mL/min (by C-G formula based on SCr of 0.69 mg/dL).   Medical History: Past Medical History:  Diagnosis Date   Arthritis    Asthma    Breast cancer ([facility] [place]) 2001   Left Breast Cancer   CHF (congestive heart failure) ([facility])    COPD (chronic obstructive pulmonary disease) ([facility])    Coronary artery disease    History of mitral valve replacement with mechanical valve    HLD (hyperlipidemia)    Hypertension    Pulmonary embolism ([facility])     Medications:  Medications Prior to Admission  Medication Sig Dispense Refill Last Dose   albuterol (PROVENTIL) (2.5 MG/3ML) 0.083% nebulizer solution USE 1 VIAL IN NEBULIZER EVERY 6 HOURS AS NEEDED FOR WHEEZING FOR SHORTNESS OF BREATH (Patient taking differently: Take 2.5 mg by nebulization every 6 (six) hours as needed for wheezing or shortness of breath.) 90 mL 0 01/05/2021   albuterol (VENTOLIN HFA) 108 (90 Base) MCG/ACT inhaler Inhale  1-2 puffs into the lungs every 6 (six) hours as needed for wheezing or shortness of breath. 6.7 g 11 01/05/2021   Calcium-Phosphorus-Vitamin D (CITRACAL +D3 PO) Take 1 tablet by mouth 2 (two) times a day.   Past Week   cetirizine (ZYRTEC ALLERGY) 10 MG tablet Take 1 tablet (10 mg total) by mouth daily. 30 tablet 2 Past Week   fluticasone (FLONASE) 50 MCG/ACT nasal spray Place 1 spray into both nostrils daily as needed for allergies. 16 each 11 01/05/2021   furosemide (LASIX) 40 MG tablet Take 1 tablet by mouth twice daily 60 tablet 2 01/05/2021   gabapentin (NEURONTIN) 600 MG tablet Take 1.5 tablets (900 mg total) by mouth 3 (three) times daily. For nerve pain- increased to get her off lyrica 150 tablet 5 Past Week   HYDROcodone-acetaminophen (NORCO) 5-325 MG tablet Take 1 tablet by mouth every 4 (four) hours as needed for moderate pain. 150 tablet 0 01/05/2021   metoprolol succinate (TOPROL-XL) 25 MG 24 hr tablet Take 1 tablet (25 mg total) by mouth daily. 90 tablet  3 01/05/2021 at 0800   potassium chloride SA (KLOR-CON) 20 MEQ tablet Take 2 tablets by mouth once daily (Patient taking differently: Take 40 mEq by mouth daily.) 60 tablet 0 01/05/2021   simvastatin (ZOCOR) 20 MG tablet Take 1 tablet by mouth once daily 90 tablet 0 01/05/2021   STIOLTO RESPIMAT 2.5-2.5 MCG/ACT AERS INHALE 2 PUFFS BY MOUTH ONCE DAILY (Patient taking differently: Take 2 puffs by mouth daily.) 4 g 0 01/05/2021   warfarin (COUMADIN) 5 MG tablet TAKE 1 TABLET BY MOUTH ONCE DAILY EXCEPT  WEDNESDAYS  TAKE  ONE  HALF  TABLET (Patient taking differently: Take 2.5-5 mg by mouth See admin instructions. Take one (1) tablet of your 5mg  peach-colored warfarin tablets, once-daily at 6PM--EXCEPT on Wednesdays, take ONLY one-half (1/2) tablet on these days.) 30 tablet 0 01/04/2021 at 1800   Spacer/Aero-Holding Chambers (BREATHERITE COLL SPACER ADULT) MISC 1 applicator by Does not apply route daily. 1 each 0    Scheduled:   azithromycin  250 mg  Oral Daily   calcium-vitamin D  1 tablet Oral Q breakfast   furosemide  80 mg Oral BID   gabapentin  900 mg Oral TID   loratadine  10 mg Oral Daily   metoprolol succinate  25 mg Oral Daily   nicotine  7 mg Transdermal Daily   predniSONE  40 mg Oral Q breakfast   simvastatin  20 mg Oral Daily   Warfarin - Pharmacist Dosing Inpatient   Does not apply q1600   Infusions:   sodium chloride Stopped (01/05/21 2309)   heparin      Assessment: 78 yof presenting to the ED with SOB. Warfarin consult for Hx of DVT in setting of mechanical valve. INR below goal at 1.9 (1.9>1.6>1.9). Bridging with Lovenox > will change to heparin drip given plan for R/L heart cath 7/20 Begin heparin 12hr after last enoxaparin dose (9am 7/19) Hold warfarin for now   Patient on warfarin takes 5mg  every day except Wednesday take 2.5 mg.   Goal of Therapy:  INR goal 2.5-3.5 Monitor platelets by anticoagulation protocol: Yes   Plan:  Hold Warfarin  Stop enoxaparin  Heparin drip 900 uts/hr start at 9pm  Daily INR, CBC and heparin level   Bonnita Nasuti Pharm.D. CPP, BCPS Clinical Pharmacist [phone] 01/07/2021 3:23 PM   Please utilize Amion for appropriate phone number to reach the unit pharmacist ([place])

## 2021-01-07 NOTE — Progress Notes (Signed)
ANTICOAGULATION CONSULT NOTE - Initial Consult  Pharmacy Consult for Warfarin, Lovenox Indication:  Hx of DVT  with mechanical valve  Allergies  Allergen Reactions   Acetaminophen-Codeine Itching   Propoxyphene Itching    Darvocet   Tape Rash    Plastic   Tramadol Itching and Nausea And Vomiting    Patient Measurements: Height: 5\' 5"  (165.1 cm) Weight: 81.6 kg (179 lb 14.4 oz) IBW/kg (Calculated) : 57  Vital Signs: Temp: 97.5 F (36.4 C) (07/19 0840) Temp Source: Oral (07/19 0840) BP: 141/92 (07/19 0840) Pulse Rate: 71 (07/19 0840)  Labs: Recent Labs    01/05/21 0857 01/05/21 1125 01/05/21 1957 01/06/21 0415 01/06/21 0947 01/06/21 1547 01/07/21 0727  HGB 14.0  --   --   --  13.9  --  13.7  HCT 43.3  --   --   --  42.9  --  43.0  PLT 400  --   --   --  378  --  425*  LABPROT 21.5*  --   --  19.4*  --   --  22.1*  INR 1.9*  --   --  1.6*  --   --  1.9*  CREATININE  --  0.66  --   --  0.93  --  0.69  TROPONINIHS  --   --  11  --   --  12  --      Estimated Creatinine Clearance: 68 mL/min (by C-G formula based on SCr of 0.69 mg/dL).   Medical History: Past Medical History:  Diagnosis Date   Arthritis    Asthma    Breast cancer ([place]) 2001   Left Breast Cancer   CHF (congestive heart failure) (HCC)    COPD (chronic obstructive pulmonary disease) (HCC)    Coronary artery disease    History of mitral valve replacement with mechanical valve    HLD (hyperlipidemia)    Hypertension    Pulmonary embolism (HCC)     Medications:  Medications Prior to Admission  Medication Sig Dispense Refill Last Dose   albuterol (PROVENTIL) (2.5 MG/3ML) 0.083% nebulizer solution USE 1 VIAL IN NEBULIZER EVERY 6 HOURS AS NEEDED FOR WHEEZING FOR SHORTNESS OF BREATH (Patient taking differently: Take 2.5 mg by nebulization every 6 (six) hours as needed for wheezing or shortness of breath.) 90 mL 0 01/05/2021   albuterol (VENTOLIN HFA) 108 (90 Base) MCG/ACT inhaler Inhale 1-2 puffs  into the lungs every 6 (six) hours as needed for wheezing or shortness of breath. 6.7 g 11 01/05/2021   Calcium-Phosphorus-Vitamin D (CITRACAL +D3 PO) Take 1 tablet by mouth 2 (two) times a day.   Past Week   cetirizine (ZYRTEC ALLERGY) 10 MG tablet Take 1 tablet (10 mg total) by mouth daily. 30 tablet 2 Past Week   fluticasone (FLONASE) 50 MCG/ACT nasal spray Place 1 spray into both nostrils daily as needed for allergies. 16 each 11 01/05/2021   furosemide (LASIX) 40 MG tablet Take 1 tablet by mouth twice daily 60 tablet 2 01/05/2021   gabapentin (NEURONTIN) 600 MG tablet Take 1.5 tablets (900 mg total) by mouth 3 (three) times daily. For nerve pain- increased to get her off lyrica 150 tablet 5 Past Week   HYDROcodone-acetaminophen (NORCO) 5-325 MG tablet Take 1 tablet by mouth every 4 (four) hours as needed for moderate pain. 150 tablet 0 01/05/2021   metoprolol succinate (TOPROL-XL) 25 MG 24 hr tablet Take 1 tablet (25 mg total) by mouth daily. 90 tablet 3 01/05/2021  at 0800   potassium chloride SA (KLOR-CON) 20 MEQ tablet Take 2 tablets by mouth once daily (Patient taking differently: Take 40 mEq by mouth daily.) 60 tablet 0 01/05/2021   simvastatin (ZOCOR) 20 MG tablet Take 1 tablet by mouth once daily 90 tablet 0 01/05/2021   STIOLTO RESPIMAT 2.5-2.5 MCG/ACT AERS INHALE 2 PUFFS BY MOUTH ONCE DAILY (Patient taking differently: Take 2 puffs by mouth daily.) 4 g 0 01/05/2021   warfarin (COUMADIN) 5 MG tablet TAKE 1 TABLET BY MOUTH ONCE DAILY EXCEPT  WEDNESDAYS  TAKE  ONE  HALF  TABLET (Patient taking differently: Take 2.5-5 mg by mouth See admin instructions. Take one (1) tablet of your 5mg  peach-colored warfarin tablets, once-daily at 6PM--EXCEPT on Wednesdays, take ONLY one-half (1/2) tablet on these days.) 30 tablet 0 01/04/2021 at 1800   Spacer/Aero-Holding Chambers (BREATHERITE COLL SPACER ADULT) MISC 1 applicator by Does not apply route daily. 1 each 0    Scheduled:   azithromycin  250 mg Oral Daily    calcium-vitamin D  1 tablet Oral Q breakfast   enoxaparin (LOVENOX) injection  1 mg/kg Subcutaneous Q12H   furosemide  40 mg Intravenous Daily   gabapentin  900 mg Oral TID   loratadine  10 mg Oral Daily   metoprolol succinate  25 mg Oral Daily   nicotine  7 mg Transdermal Daily   predniSONE  40 mg Oral Q breakfast   simvastatin  20 mg Oral Daily   Warfarin - Pharmacist Dosing Inpatient   Does not apply q1600   Infusions:   sodium chloride Stopped (01/05/21 2309)    Assessment: 79 yof presenting to the ED with SOB. Warfarin consult for Hx of DVT in setting of mechanical valve. INR below goal at 1.9 (1.9>1.6>1.9). Bridging with Lovenox until at goal for mechanical valve.  Patient on warfarin takes 5mg  every day except Wednesday take 2.5 mg.   Goal of Therapy:  INR goal 2.5-3.5 Monitor platelets by anticoagulation protocol: Yes   Plan:  Repeat 7.5mg  warfarin x 1 today Continue bridge with Lovenox 80 mg SQ q12h until INR at goal (2.5-3.5) Daily INR  Kim Anthony A. Levada Dy, PharmD, BCPS, FNKF Clinical Pharmacist [place] Please utilize Amion for appropriate phone number to reach the unit pharmacist ([place])

## 2021-01-08 ENCOUNTER — Encounter: Payer: Medicare Other | Admitting: Physical Medicine and Rehabilitation

## 2021-01-08 ENCOUNTER — Other Ambulatory Visit: Payer: Self-pay

## 2021-01-08 DIAGNOSIS — I5042 Chronic combined systolic (congestive) and diastolic (congestive) heart failure: Secondary | ICD-10-CM | POA: Diagnosis not present

## 2021-01-08 DIAGNOSIS — J441 Chronic obstructive pulmonary disease with (acute) exacerbation: Secondary | ICD-10-CM | POA: Diagnosis not present

## 2021-01-08 DIAGNOSIS — R0603 Acute respiratory distress: Secondary | ICD-10-CM | POA: Diagnosis not present

## 2021-01-08 LAB — C-REACTIVE PROTEIN: CRP: 1.5 mg/dL — ABNORMAL HIGH (ref <1.0)

## 2021-01-08 LAB — PROTIME-INR
INR: 2.2 — ABNORMAL HIGH (ref 0.8–1.2)
Prothrombin Time: 24.6 seconds — ABNORMAL HIGH (ref 11.4–15.2)

## 2021-01-08 LAB — CBC
HCT: 42.3 % (ref 36.0–46.0)
Hemoglobin: 13.5 g/dL (ref 12.0–15.0)
MCH: 32.8 pg (ref 26.0–34.0)
MCHC: 31.9 g/dL (ref 30.0–36.0)
MCV: 102.7 fL — ABNORMAL HIGH (ref 80.0–100.0)
Platelets: 443 10*3/uL — ABNORMAL HIGH (ref 150–400)
RBC: 4.12 MIL/uL (ref 3.87–5.11)
RDW: 11.9 % (ref 11.5–15.5)
WBC: 11.2 10*3/uL — ABNORMAL HIGH (ref 4.0–10.5)
nRBC: 0 % (ref 0.0–0.2)

## 2021-01-08 LAB — BASIC METABOLIC PANEL
Anion gap: 8 (ref 5–15)
BUN: 19 mg/dL (ref 8–23)
CO2: 20 mmol/L — ABNORMAL LOW (ref 22–32)
Calcium: 9.2 mg/dL (ref 8.9–10.3)
Chloride: 107 mmol/L (ref 98–111)
Creatinine, Ser: 0.64 mg/dL (ref 0.44–1.00)
GFR, Estimated: >60 mL/min (ref >60)
Glucose, Bld: 88 mg/dL (ref 70–99)
Potassium: 4 mmol/L (ref 3.5–5.1)
Sodium: 135 mmol/L (ref 135–145)

## 2021-01-08 LAB — SEDIMENTATION RATE: Sed Rate: 28 mm/hr — ABNORMAL HIGH (ref 0–22)

## 2021-01-08 LAB — URIC ACID: Uric Acid, Serum: 10.7 mg/dL — ABNORMAL HIGH (ref 2.5–7.1)

## 2021-01-08 LAB — HEPARIN LEVEL (UNFRACTIONATED): Heparin Unfractionated: 0.63 IU/mL (ref 0.30–0.70)

## 2021-01-08 MED ORDER — SODIUM CHLORIDE 0.9 % IV SOLN
250.0000 mL | INTRAVENOUS | Status: DC | PRN
Start: 2021-01-08 — End: 2021-01-09

## 2021-01-08 MED ORDER — ASPIRIN 81 MG PO CHEW
81.0000 mg | CHEWABLE_TABLET | ORAL | Status: AC
  Administered 2021-01-08: 81 mg via ORAL
  Filled 2021-01-08: qty 1

## 2021-01-08 MED ORDER — SODIUM CHLORIDE 0.9 % IV SOLN: INTRAVENOUS | Status: DC

## 2021-01-08 MED ORDER — SODIUM CHLORIDE 0.9% FLUSH
3.0000 mL | Freq: Two times a day (BID) | INTRAVENOUS | Status: DC
  Administered 2021-01-08 – 2021-01-09 (×3): 3 mL via INTRAVENOUS

## 2021-01-08 MED ORDER — ISOSORBIDE MONONITRATE ER 30 MG PO TB24
30.0000 mg | ORAL_TABLET | Freq: Every day | ORAL | Status: DC
  Administered 2021-01-08 – 2021-01-11 (×4): 30 mg via ORAL
  Filled 2021-01-08 (×5): qty 1

## 2021-01-08 MED ORDER — HYDRALAZINE HCL 25 MG PO TABS
12.5000 mg | ORAL_TABLET | Freq: Three times a day (TID) | ORAL | Status: DC
  Administered 2021-01-08 – 2021-01-11 (×8): 12.5 mg via ORAL
  Filled 2021-01-08 (×8): qty 1

## 2021-01-08 MED ORDER — ASPIRIN 81 MG PO CHEW: 81.0000 mg | CHEWABLE_TABLET | ORAL | Status: DC

## 2021-01-08 MED ORDER — ENSURE ENLIVE PO LIQD
237.0000 mL | Freq: Two times a day (BID) | ORAL | Status: AC
  Administered 2021-01-08 (×2): 237 mL via ORAL

## 2021-01-08 MED ORDER — SODIUM CHLORIDE 0.9% FLUSH: 3.0000 mL | INTRAVENOUS | Status: DC | PRN

## 2021-01-08 NOTE — Progress Notes (Signed)
ANTICOAGULATION CONSULT NOTE - Initial Consult  Pharmacy Consult for Warfarin, Lovenox > Heparin Indication:  Hx of DVT  with mechanical valve  Allergies  Allergen Reactions   Lisinopril Swelling   Acetaminophen-Codeine Itching   Propoxyphene Itching    Darvocet   Tape Rash    Plastic   Tramadol Itching and Nausea And Vomiting    Patient Measurements: Height: 5\' 5"  (165.1 cm) Weight: 81.6 kg (179 lb 14.4 oz) IBW/kg (Calculated) : 57  Vital Signs: Temp: 98.2 F (36.8 C) (07/20 0826) Temp Source: Oral (07/19 2206) BP: 131/76 (07/20 0826) Pulse Rate: 64 (07/20 0826)  Labs: Recent Labs    01/05/21 1125 01/05/21 1957 01/06/21 0415 01/06/21 0947 01/06/21 1547 01/07/21 0727 01/08/21 0522  HGB   < >  --   --  13.9  --  13.7 13.5  HCT  --   --   --  42.9  --  43.0 42.3  PLT  --   --   --  378  --  425* 443*  LABPROT  --   --  19.4*  --   --  22.1* 24.6*  INR  --   --  1.6*  --   --  1.9* 2.2*  HEPARINUNFRC  --   --   --   --   --   --  0.63  CREATININE  --   --   --  0.93  --  0.69 0.64  TROPONINIHS  --  11  --   --  12  --   --    < > = values in this interval not displayed.     Estimated Creatinine Clearance: 68 mL/min (by C-G formula based on SCr of 0.64 mg/dL).   Medical History: Past Medical History:  Diagnosis Date   Arthritis    Asthma    Breast cancer ([place]) 2001   Left Breast Cancer   CHF (congestive heart failure) (HCC)    COPD (chronic obstructive pulmonary disease) (HCC)    Coronary artery disease    History of mitral valve replacement with mechanical valve    HLD (hyperlipidemia)    Hypertension    Pulmonary embolism (HCC)     Medications:  Medications Prior to Admission  Medication Sig Dispense Refill Last Dose   albuterol (PROVENTIL) (2.5 MG/3ML) 0.083% nebulizer solution USE 1 VIAL IN NEBULIZER EVERY 6 HOURS AS NEEDED FOR WHEEZING FOR SHORTNESS OF BREATH (Patient taking differently: Take 2.5 mg by nebulization every 6 (six) hours as needed  for wheezing or shortness of breath.) 90 mL 0 01/05/2021   albuterol (VENTOLIN HFA) 108 (90 Base) MCG/ACT inhaler Inhale 1-2 puffs into the lungs every 6 (six) hours as needed for wheezing or shortness of breath. 6.7 g 11 01/05/2021   Calcium-Phosphorus-Vitamin D (CITRACAL +D3 PO) Take 1 tablet by mouth 2 (two) times a day.   Past Week   cetirizine (ZYRTEC ALLERGY) 10 MG tablet Take 1 tablet (10 mg total) by mouth daily. 30 tablet 2 Past Week   fluticasone (FLONASE) 50 MCG/ACT nasal spray Place 1 spray into both nostrils daily as needed for allergies. 16 each 11 01/05/2021   furosemide (LASIX) 40 MG tablet Take 1 tablet by mouth twice daily 60 tablet 2 01/05/2021   gabapentin (NEURONTIN) 600 MG tablet Take 1.5 tablets (900 mg total) by mouth 3 (three) times daily. For nerve pain- increased to get her off lyrica 150 tablet 5 Past Week   HYDROcodone-acetaminophen (NORCO) 5-325 MG tablet Take 1  tablet by mouth every 4 (four) hours as needed for moderate pain. 150 tablet 0 01/05/2021   metoprolol succinate (TOPROL-XL) 25 MG 24 hr tablet Take 1 tablet (25 mg total) by mouth daily. 90 tablet 3 01/05/2021 at 0800   potassium chloride SA (KLOR-CON) 20 MEQ tablet Take 2 tablets by mouth once daily (Patient taking differently: Take 40 mEq by mouth daily.) 60 tablet 0 01/05/2021   simvastatin (ZOCOR) 20 MG tablet Take 1 tablet by mouth once daily 90 tablet 0 01/05/2021   STIOLTO RESPIMAT 2.5-2.5 MCG/ACT AERS INHALE 2 PUFFS BY MOUTH ONCE DAILY (Patient taking differently: Take 2 puffs by mouth daily.) 4 g 0 01/05/2021   warfarin (COUMADIN) 5 MG tablet TAKE 1 TABLET BY MOUTH ONCE DAILY EXCEPT  WEDNESDAYS  TAKE  ONE  HALF  TABLET (Patient taking differently: Take 2.5-5 mg by mouth See admin instructions. Take one (1) tablet of your 5mg  peach-colored warfarin tablets, once-daily at 6PM--EXCEPT on Wednesdays, take ONLY one-half (1/2) tablet on these days.) 30 tablet 0 01/04/2021 at 1800   Spacer/Aero-Holding Chambers  (BREATHERITE COLL SPACER ADULT) MISC 1 applicator by Does not apply route daily. 1 each 0    Scheduled:   atorvastatin  40 mg Oral Daily   calcium-vitamin D  1 tablet Oral Q breakfast   feeding supplement  237 mL Oral BID BM   furosemide  80 mg Oral BID   gabapentin  900 mg Oral TID   hydrALAZINE  12.5 mg Oral Q8H   isosorbide mononitrate  30 mg Oral Daily   loratadine  10 mg Oral Daily   metoprolol succinate  25 mg Oral Daily   nicotine  7 mg Transdermal Daily   sodium chloride flush  3 mL Intravenous Q12H   Warfarin - Pharmacist Dosing Inpatient   Does not apply q1600   Infusions:   sodium chloride Stopped (01/05/21 2309)   sodium chloride     sodium chloride 10 mL/hr at 01/08/21 0551   heparin 900 Units/hr (01/07/21 2100)    Assessment: 7 yof presenting to the ED with SOB. Warfarin consult for Hx of DVT in setting of mechanical valve. INR below goal at 1.9 (1.9>1.6>1.9>2.2). Bridging with Lovenox > will change to heparin drip given plan for R/L heart cath post poned to 7/21 with INR elevated - will give 2 ensure today and recommend she eat salad for lunch heparin started 12hr after last enoxaparin dose (9am 7/19) Hold warfarin for now  Heparin drip 900 uts/hr heparin level at goal 0.6  CBC stable   Patient on warfarin takes 5mg  every day except Wednesday take 2.5 mg.   Goal of Therapy:  Heparin level 0.3-0.7 INR goal 2.5-3.5 Monitor platelets by anticoagulation protocol: Yes   Plan:  Hold Warfarin  Heparin drip 900 uts/hr  Daily INR, CBC and heparin level   Bonnita Nasuti Pharm.D. CPP, BCPS Clinical Pharmacist [phone] 01/08/2021 9:53 AM   Please utilize Amion for appropriate phone number to reach the unit pharmacist ([place])

## 2021-01-08 NOTE — Progress Notes (Signed)
CARDIAC REHAB PHASE I   HF education completed with pt. Pt given HF booklet. Reviewed importance of daily weights. Pt for cath tomorrow. Will continue to follow.  [id number] Rufina Falco, RN BSN 01/08/2021 2:06 PM

## 2021-01-08 NOTE — TOC Initial Note (Addendum)
Transition of Care [facility]) - Initial/Assessment Note    Patient Details  Name: Kim Anthony [name]: [id number] Date of Birth: 1950-11-05  Transition of Care [facility]) CM/SW Contact:    Erenest Rasher, RN Phone Number: [phone] 01/08/2021, 5:04 PM  Clinical Narrative:                  TOC CM/CSW spoke to pt at bedside. Pt states she has a scale but does not weigh daily. Educated pt on the importance of daily weight and record in her calendar. States she has CHF brochure and will review. She has a pill planner at home and agrees to start using. She believes she may miss an evening dose of meds sometimes. Discussed with pt compliance with meds. States she does eat salt and cornstarch and willing to speak to a Nutritionist. She states eating salt on her fruit is an old habit from childhood. Educated pt ways she can use salt substitutes to enhance flavor of foods.     Expected Discharge Plan: Home/Self Care Barriers to Discharge: Continued Medical Work up   Patient Goals and CMS Choice        Expected Discharge Plan and Services Expected Discharge Plan: Home/Self Care In-house Referral: Clinical Social Work Discharge Planning Services: CM Consult   Living arrangements for the past 2 months: Single Family Home                                      Prior Living Arrangements/Services Living arrangements for the past 2 months: Single Family Home Lives with:: Self, Adult Children, Minor Children Patient language and need for interpreter reviewed:: Yes Do you feel safe going back to the place where you live?: Yes      Need for Family Participation in Patient Care: No (Comment) Care giver support system in place?: No (comment) Current home services: DME (cane, nebulizer machine, portable nebulizer machine) Criminal Activity/Legal Involvement Pertinent to Current Situation/Hospitalization: No - Comment as needed  Activities of Daily Living Home Assistive Devices/Equipment: Cane  (specify quad or straight) ADL Screening (condition at time of admission) Patient's cognitive ability adequate to safely complete daily activities?: Yes Is the patient deaf or have difficulty hearing?: No Does the patient have difficulty seeing, even when wearing glasses/contacts?: No Does the patient have difficulty concentrating, remembering, or making decisions?: No Patient able to express need for assistance with ADLs?: Yes Does the patient have difficulty dressing or bathing?: No Independently performs ADLs?: Yes (appropriate for developmental age) Does the patient have difficulty walking or climbing stairs?: No Weakness of Legs: None Weakness of Arms/Hands: None  Permission Sought/Granted Permission sought to share information with : Case Manager, PCP, Family Supports Permission granted to share information with : Yes, Verbal Permission Granted  Share Information with NAME: Elynor Kallenberger  Permission granted to share info w AGENCY: [place] granted to share info w Relationship: daughter  Permission granted to share info w Contact Information: [phone]  Emotional Assessment Appearance:: Appears stated age Attitude/Demeanor/Rapport: Engaged Affect (typically observed): Pleasant Orientation: : Oriented to Self, Oriented to Place, Oriented to  Time, Oriented to Situation Alcohol / Substance Use: Tobacco Use Psych Involvement: No (comment)  Admission diagnosis:  Dyspnea [R06.00] Respiratory distress [R06.03] COPD exacerbation ([place]) [J44.1] Patient Active Problem List   Diagnosis Date Noted   Respiratory distress    Dyspnea 01/05/2021   Acute idiopathic gout of  multiple sites 09/30/2020   Eye redness 02/15/2020   Chronic pain syndrome 01/17/2020   Monoarthritis of wrist 10/18/2019   Ganglion cyst 09/21/2019   Hip pain 08/23/2019   Thoracic ascending aortic aneurysm ([place]) 07/05/2019   Pustular rash 03/09/2019   Anemia 02/16/2019   Breast cancer ([place])     Acquired absence of left breast 12/20/2018   Open wound of axillary region 05/11/2018   COPD exacerbation ([place]) 11/25/2017   S/P MVR (mitral valve replacement)    Chronic anticoagulation    Tobacco use disorder 02/25/2017   Lymphadenopathy, mediastinal 02/25/2017   History of mitral valve replacement with mechanical valve 11/18/2016   Essential hypertension 02/01/2016   Allergic rhinitis 02/01/2016   Healthcare maintenance 01/30/2016   Chronic low back pain 07/22/2015   Pulmonary embolism ([place]) 07/11/2015   Chronic combined systolic and diastolic congestive heart failure ([place]) 07/11/2015   Headache, common migraine 07/11/2015   HLD (hyperlipidemia) 07/11/2015   PCP:  Marianna Payment, MD Pharmacy:   [facility] [address] [place] [address] Phone: [phone] Fax: [phone]  [facility] [address] [place] [address] Phone: [phone] Fax: [phone]     Social Determinants of Health (SDOH) Interventions Food Insecurity Interventions: Assist with SNAP Application, Other (Comment) (Patient reports she gets food money from her health insurance [facility] Medicare and that she would like to apply again for Food Stamps) Financial Strain Interventions: Other (Comment) (Referral to outpatinet Advanced HF clinic for help with light bill and [place] referral for EBT) Housing Interventions: Intervention Not Indicated Transportation Interventions: Intervention Not Indicated  Readmission Risk Interventions Readmission Risk Prevention Plan 02/01/2019  Transportation Screening Complete  PCP or Specialist Appt within 3-5 Days Complete  HRI or [place] Complete  Social Work Consult for [place] Planning/Counseling Patient refused  Palliative Care Screening Not Applicable  Medication Review Press photographer) Complete  Some recent data might be hidden

## 2021-01-08 NOTE — Telephone Encounter (Signed)
Pt is requesting a refill on Hydrocodone per pmp last filled , 12/12/20 . However she did miss her appt today because she is in the hospital and wont be release until Friday .

## 2021-01-08 NOTE — H&P (View-Only) (Signed)
Advanced Heart Failure Rounding Note  PCP-Cardiologist: None   Subjective:   INR 2.2 --LHC/RHC cancelled.   Remains on heparin drip.   Denies SOB. Had some chest tightness last night.    Objective:   Weight Range: 81.6 kg Body mass index is 29.94 kg/m.   Vital Signs:   Temp:  [97.6 F (36.4 C)-98.2 F (36.8 C)] 98.2 F (36.8 C) (07/20 0826) Pulse Rate:  [64-69] 64 (07/20 0826) Resp:  [14-18] 17 (07/20 0826) BP: (123-134)/(66-81) 131/76 (07/20 0826) SpO2:  [92 %-95 %] 94 % (07/20 0826) Last BM Date: 01/07/21  Weight change: Filed Weights   01/05/21 0800 01/05/21 1841 01/07/21 0635  Weight: 83.9 kg 80.3 kg 81.6 kg    Intake/Output:   Intake/Output Summary (Last 24 hours) at 01/08/2021 0924 Last data filed at 01/08/2021 0400 Gross per 24 hour  Intake 163 ml  Output 600 ml  Net -437 ml      Physical Exam    General:  Well appearing. No resp difficulty HEENT: Normal Neck: Supple. JVP 5-6 . Carotids 2+ bilat; no bruits. No lymphadenopathy or thyromegaly appreciated. Cor: PMI nondisplaced. Regular rate & rhythm. No rubs, gallops. Mechanical S1. Lungs: Clear Abdomen: Soft, nontender, nondistended. No hepatosplenomegaly. No bruits or masses. Good bowel sounds. Extremities: No cyanosis, clubbing, rash, edema Neuro: Alert & orientedx3, cranial nerves grossly intact. moves all 4 extremities w/o difficulty. Affect pleasant   Telemetry  SR 60-70s Personally reviewed  EKG    N/A  Labs    CBC Recent Labs    01/07/21 0727 01/08/21 0522  WBC 10.7* 11.2*  HGB 13.7 13.5  HCT 43.0 42.3  MCV 104.6* 102.7*  PLT 425* 737*   Basic Metabolic Panel Recent Labs    01/06/21 2233 01/07/21 0727 01/08/21 0522  NA  --  136 135  K  --  4.7 4.0  CL  --  110 107  CO2  --  19* 20*  GLUCOSE  --  84 88  BUN  --  17 19  CREATININE  --  0.69 0.64  CALCIUM  --  8.9 9.2  MG 1.6* 3.3*  --    Liver Function Tests Recent Labs    01/05/21 1125  AST 53*  ALT 23   ALKPHOS 111  BILITOT 0.7  PROT 7.8  ALBUMIN 3.6   No results for input(s): LIPASE, AMYLASE in the last 72 hours. Cardiac Enzymes No results for input(s): CKTOTAL, CKMB, CKMBINDEX, TROPONINI in the last 72 hours.  BNP: BNP (last 3 results) Recent Labs    02/09/20 1145 08/26/20 1442 01/05/21 0857  BNP 131.5* 277.3* 209.8*    ProBNP (last 3 results) No results for input(s): PROBNP in the last 8760 hours.   D-Dimer No results for input(s): DDIMER in the last 72 hours. Hemoglobin A1C No results for input(s): HGBA1C in the last 72 hours. Fasting Lipid Panel No results for input(s): CHOL, HDL, LDLCALC, TRIG, CHOLHDL, LDLDIRECT in the last 72 hours. Thyroid Function Tests Recent Labs    01/07/21 0727  TSH 2.775    Other results:   Imaging    No results found.   Medications:     Scheduled Medications:  atorvastatin  40 mg Oral Daily   calcium-vitamin D  1 tablet Oral Q breakfast   furosemide  80 mg Oral BID   gabapentin  900 mg Oral TID   loratadine  10 mg Oral Daily   metoprolol succinate  25 mg Oral Daily  nicotine  7 mg Transdermal Daily   sodium chloride flush  3 mL Intravenous Q12H   Warfarin - Pharmacist Dosing Inpatient   Does not apply q1600    Infusions:  sodium chloride Stopped (01/05/21 2309)   sodium chloride     sodium chloride 10 mL/hr at 01/08/21 0551   heparin 900 Units/hr (01/07/21 2100)    PRN Medications: sodium chloride, sodium chloride, albuterol, HYDROcodone-acetaminophen, ipratropium-albuterol, ramelteon, sodium chloride flush   Assessment/Plan  Acute on chronic combined CHF: --NYHA class IV on admit. Chest x-ray with evidence of CHF -EF 35-40% this admit, previously 55-60% in 2020. EF 45-50% in 2017 in setting of PE .  -Etiology for decline in EF not certain. Need to rule out obstructive CAD. Surgical changes suggestive of prior CABG (patient unaware of prior CABG) and extensive calcification in native coronaries noted on CT  chest from August 2021.   Cath cancelled today with INR 2.2-->  Set up for tomorrow  -On metoprolol xl 25 mg daily -Had been on ACE-I previously. Stopped after seen in ED in April 2022 with facial swelling and possible angioedema. ARNi contraindicated.  -Add spiro 12.5 mg daily.  - Add 30 mg Imur daily and 12.5 mg hydralazine tid.  - Renal function stable.    2. Acute respiratory failure with hypoxia: -Probably secondary to combination of acute on chronic CHF and COPD exacerbation -Intermittently required 2L 02 [place]-Sats stable.  -COPD exacerbation treated by primary team  3. Hx of mechanical mitral valve replacement: -Mechanical MVR in [place] in 2001 -mean gradient of 4 mmHg with no significant MR on echo this admit -INR 2.2  - Off warfarin and on heparin drip.    4. Tricuspid valve regurgitation: -Moderate in severity on echo   5. Hx DVT/PE: -Place SCDs.  - On heparin drip.    6. HTN: Stable.    7. Smoker: -Smoking cessation discussed -wearing patch   8. Hyperlipidemia: -On Simvastatin. - High intensity statin therapy. Switched to 40 mg atorvastatin.      SDOH: -No transportation needs. Lives independently. -Has Medicare.  No difficulty obtaining meds. Will review HF meds with pharmacist.  Cath cancelled with INR 2.2. Plan for tomorrow. Cardiac Rehab following.   Length of Stay: 2  Darrick Grinder, NP  01/08/2021, 9:24 AM  Advanced Heart Failure Team Pager [phone] (M-F; 7a - 5p)  Please contact [facility] for night-coverage after hours (5p -7a ) and weekends on [email or web address]  Patient seen and examined with the above-signed Advanced Practice Provider and/or Housestaff. I personally reviewed laboratory data, imaging studies and relevant notes. I independently examined the patient and formulated the important aspects of the plan. I have edited the note to reflect any of my changes or salient points. I have personally discussed the plan with the patient and/or  family.  Feels ok. Occasional CP. No SOB.    General:  Well appearing. No resp difficulty HEENT: normal Neck: supple. no JVD. Carotids 2+ bilat; no bruits. No lymphadenopathy or thryomegaly appreciated. Cor: PMI nondisplaced. Regular rate & rhythm. Mechanical s1 Lungs: clear Abdomen: obese soft, nontender, nondistended. No hepatosplenomegaly. No bruits or masses. Good bowel sounds. Extremities: no cyanosis, clubbing, rash, edema Neuro: alert & orientedx3, cranial nerves grossly intact. moves all 4 extremities w/o difficulty. Affect pleasant  Overall stable. Still with intermittent CP. Will plan R/L cath when INR <= 1.8.   Glori Bickers, MD  1:27 PM

## 2021-01-08 NOTE — Progress Notes (Addendum)
Advanced Heart Failure Rounding Note  PCP-Cardiologist: None   Subjective:   INR 2.2 --LHC/RHC cancelled.   Remains on heparin drip.   Denies SOB. Had some chest tightness last night.    Objective:   Weight Range: 81.6 kg Body mass index is 29.94 kg/m.   Vital Signs:   Temp:  [97.6 F (36.4 C)-98.2 F (36.8 C)] 98.2 F (36.8 C) (07/20 0826) Pulse Rate:  [64-69] 64 (07/20 0826) Resp:  [14-18] 17 (07/20 0826) BP: (123-134)/(66-81) 131/76 (07/20 0826) SpO2:  [92 %-95 %] 94 % (07/20 0826) Last BM Date: 01/07/21  Weight change: Filed Weights   01/05/21 0800 01/05/21 1841 01/07/21 0635  Weight: 83.9 kg 80.3 kg 81.6 kg    Intake/Output:   Intake/Output Summary (Last 24 hours) at 01/08/2021 0924 Last data filed at 01/08/2021 0400 Gross per 24 hour  Intake 163 ml  Output 600 ml  Net -437 ml      Physical Exam    General:  Well appearing. No resp difficulty HEENT: Normal Neck: Supple. JVP 5-6 . Carotids 2+ bilat; no bruits. No lymphadenopathy or thyromegaly appreciated. Cor: PMI nondisplaced. Regular rate & rhythm. No rubs, gallops. Mechanical S1. Lungs: Clear Abdomen: Soft, nontender, nondistended. No hepatosplenomegaly. No bruits or masses. Good bowel sounds. Extremities: No cyanosis, clubbing, rash, edema Neuro: Alert & orientedx3, cranial nerves grossly intact. moves all 4 extremities w/o difficulty. Affect pleasant   Telemetry  SR 60-70s Personally reviewed  EKG    N/A  Labs    CBC Recent Labs    01/07/21 0727 01/08/21 0522  WBC 10.7* 11.2*  HGB 13.7 13.5  HCT 43.0 42.3  MCV 104.6* 102.7*  PLT 425* 160*   Basic Metabolic Panel Recent Labs    01/06/21 2233 01/07/21 0727 01/08/21 0522  NA  --  136 135  K  --  4.7 4.0  CL  --  110 107  CO2  --  19* 20*  GLUCOSE  --  84 88  BUN  --  17 19  CREATININE  --  0.69 0.64  CALCIUM  --  8.9 9.2  MG 1.6* 3.3*  --    Liver Function Tests Recent Labs    01/05/21 1125  AST 53*  ALT 23   ALKPHOS 111  BILITOT 0.7  PROT 7.8  ALBUMIN 3.6   No results for input(s): LIPASE, AMYLASE in the last 72 hours. Cardiac Enzymes No results for input(s): CKTOTAL, CKMB, CKMBINDEX, TROPONINI in the last 72 hours.  BNP: BNP (last 3 results) Recent Labs    02/09/20 1145 08/26/20 1442 01/05/21 0857  BNP 131.5* 277.3* 209.8*    ProBNP (last 3 results) No results for input(s): PROBNP in the last 8760 hours.   D-Dimer No results for input(s): DDIMER in the last 72 hours. Hemoglobin A1C No results for input(s): HGBA1C in the last 72 hours. Fasting Lipid Panel No results for input(s): CHOL, HDL, LDLCALC, TRIG, CHOLHDL, LDLDIRECT in the last 72 hours. Thyroid Function Tests Recent Labs    01/07/21 0727  TSH 2.775    Other results:   Imaging    No results found.   Medications:     Scheduled Medications:  atorvastatin  40 mg Oral Daily   calcium-vitamin D  1 tablet Oral Q breakfast   furosemide  80 mg Oral BID   gabapentin  900 mg Oral TID   loratadine  10 mg Oral Daily   metoprolol succinate  25 mg Oral Daily  nicotine  7 mg Transdermal Daily   sodium chloride flush  3 mL Intravenous Q12H   Warfarin - Pharmacist Dosing Inpatient   Does not apply q1600    Infusions:  sodium chloride Stopped (01/05/21 2309)   sodium chloride     sodium chloride 10 mL/hr at 01/08/21 0551   heparin 900 Units/hr (01/07/21 2100)    PRN Medications: sodium chloride, sodium chloride, albuterol, HYDROcodone-acetaminophen, ipratropium-albuterol, ramelteon, sodium chloride flush   Assessment/Plan  Acute on chronic combined CHF: --NYHA class IV on admit. Chest x-ray with evidence of CHF -EF 35-40% this admit, previously 55-60% in 2020. EF 45-50% in 2017 in setting of PE .  -Etiology for decline in EF not certain. Need to rule out obstructive CAD. Surgical changes suggestive of prior CABG (patient unaware of prior CABG) and extensive calcification in native coronaries noted on CT  chest from August 2021.   Cath cancelled today with INR 2.2-->  Set up for tomorrow  -On metoprolol xl 25 mg daily -Had been on ACE-I previously. Stopped after seen in ED in April 2022 with facial swelling and possible angioedema. ARNi contraindicated.  -Add spiro 12.5 mg daily.  - Add 30 mg Imur daily and 12.5 mg hydralazine tid.  - Renal function stable.    2. Acute respiratory failure with hypoxia: -Probably secondary to combination of acute on chronic CHF and COPD exacerbation -Intermittently required 2L 02 [place]-Sats stable.  -COPD exacerbation treated by primary team  3. Hx of mechanical mitral valve replacement: -Mechanical MVR in [place] in 2001 -mean gradient of 4 mmHg with no significant MR on echo this admit -INR 2.2  - Off warfarin and on heparin drip.    4. Tricuspid valve regurgitation: -Moderate in severity on echo   5. Hx DVT/PE: -Place SCDs.  - On heparin drip.    6. HTN: Stable.    7. Smoker: -Smoking cessation discussed -wearing patch   8. Hyperlipidemia: -On Simvastatin. - High intensity statin therapy. Switched to 40 mg atorvastatin.      SDOH: -No transportation needs. Lives independently. -Has Medicare.  No difficulty obtaining meds. Will review HF meds with pharmacist.  Cath cancelled with INR 2.2. Plan for tomorrow. Cardiac Rehab following.   Length of Stay: 2  Darrick Grinder, NP  01/08/2021, 9:24 AM  Advanced Heart Failure Team Pager [phone] (M-F; 7a - 5p)  Please contact [facility] for night-coverage after hours (5p -7a ) and weekends on [email or web address]  Patient seen and examined with the above-signed Advanced Practice Provider and/or Housestaff. I personally reviewed laboratory data, imaging studies and relevant notes. I independently examined the patient and formulated the important aspects of the plan. I have edited the note to reflect any of my changes or salient points. I have personally discussed the plan with the patient and/or  family.  Feels ok. Occasional CP. No SOB.    General:  Well appearing. No resp difficulty HEENT: normal Neck: supple. no JVD. Carotids 2+ bilat; no bruits. No lymphadenopathy or thryomegaly appreciated. Cor: PMI nondisplaced. Regular rate & rhythm. Mechanical s1 Lungs: clear Abdomen: obese soft, nontender, nondistended. No hepatosplenomegaly. No bruits or masses. Good bowel sounds. Extremities: no cyanosis, clubbing, rash, edema Neuro: alert & orientedx3, cranial nerves grossly intact. moves all 4 extremities w/o difficulty. Affect pleasant  Overall stable. Still with intermittent CP. Will plan R/L cath when INR <= 1.8.   Glori Bickers, MD  1:27 PM

## 2021-01-08 NOTE — TOC Initial Note (Addendum)
Transition of Care (TOC) - Initial/Assessment Note  Heart Failure   Patient Details  Name: Kim Anthony [name]: [id number] Date of Birth: 08-29-1950  Transition of Care [facility]) CM/SW Contact:    [place], [place] Phone Number: 01/08/2021, 4:46 PM  Clinical Narrative:                 CSW and RNCM spoke with the patient at bedside and completed a brief SDOH with the patient who reported needing help with her light bill. Kim Anthony reported that she is still driving, does have a PCP and she can get to the pharmacy to pick up her medications from [facility]. Kim Anthony reported that she receives financial assistance maybe disability and or SSI but that they take half of her checks due to her assets. Kim Anthony reported that her home is no longer in her name but her daughters name and that she lives with her daughter and granddaughter and would like to try again for Food Stamps. CSW will speak with the Advanced HF team about help the patients light bill and send a referral to the [facility] if Kim Anthony is agreeable.   CSW will continue to follow throughout discharge.   Barriers to Discharge: Continued Medical Work up   Patient Goals and CMS Choice        Expected Discharge Plan and Services   In-house Referral: Clinical Social Work Discharge Planning Services: CM Consult   Living arrangements for the past 2 months: Single Family Home                                      Prior Living Arrangements/Services Living arrangements for the past 2 months: Single Family Home Lives with:: Self, Adult Children, Minor Children Patient language and need for interpreter reviewed:: Yes        Need for Family Participation in Patient Care: No (Comment) Care giver support system in place?: No (comment)   Criminal Activity/Legal Involvement Pertinent to Current Situation/Hospitalization: No - Comment as needed  Activities of Daily Living Home Assistive Devices/Equipment: Cane (specify quad or  straight) ADL Screening (condition at time of admission) Patient's cognitive ability adequate to safely complete daily activities?: Yes Is the patient deaf or have difficulty hearing?: No Does the patient have difficulty seeing, even when wearing glasses/contacts?: No Does the patient have difficulty concentrating, remembering, or making decisions?: No Patient able to express need for assistance with ADLs?: Yes Does the patient have difficulty dressing or bathing?: No Independently performs ADLs?: Yes (appropriate for developmental age) Does the patient have difficulty walking or climbing stairs?: No Weakness of Legs: None Weakness of Arms/Hands: None  Permission Sought/Granted                  Emotional Assessment Appearance:: Appears stated age Attitude/Demeanor/Rapport: Engaged Affect (typically observed): Pleasant Orientation: : Oriented to Self, Oriented to Place, Oriented to  Time, Oriented to Situation   Psych Involvement: No (comment)  Admission diagnosis:  Dyspnea [R06.00] Respiratory distress [R06.03] COPD exacerbation (HCC) [J44.1] Patient Active Problem List   Diagnosis Date Noted   Respiratory distress    Dyspnea 01/05/2021   Acute idiopathic gout of multiple sites 09/30/2020   Eye redness 02/15/2020   Chronic pain syndrome 01/17/2020   Monoarthritis of wrist 10/18/2019   Ganglion cyst 09/21/2019   Hip pain 08/23/2019   Thoracic ascending aortic aneurysm ([place]) 07/05/2019   Pustular  rash 03/09/2019   Anemia 02/16/2019   Breast cancer ([place])    Acquired absence of left breast 12/20/2018   Open wound of axillary region 05/11/2018   COPD exacerbation ([place]) 11/25/2017   S/P MVR (mitral valve replacement)    Chronic anticoagulation    Tobacco use disorder 02/25/2017   Lymphadenopathy, mediastinal 02/25/2017   History of mitral valve replacement with mechanical valve 11/18/2016   Essential hypertension 02/01/2016   Allergic rhinitis 02/01/2016   Healthcare  maintenance 01/30/2016   Chronic low back pain 07/22/2015   Pulmonary embolism ([place]) 07/11/2015   Chronic combined systolic and diastolic congestive heart failure ([place]) 07/11/2015   Headache, common migraine 07/11/2015   HLD (hyperlipidemia) 07/11/2015   PCP:  Marianna Payment, MD Pharmacy:   [facility], [address] [place] [address] Phone: [phone] Fax: [phone]  [facility] [address] [place] [address] Phone: [phone] Fax: [phone]     Social Determinants of Health (SDOH) Interventions Food Insecurity Interventions: Assist with SNAP Application, Other (Comment) (Patient reports she gets food money from her health insurance [facility] Medicare and that she would like to apply again for Food Stamps) Financial Strain Interventions: Other (Comment) (Referral to outpatinet Advanced HF clinic for help with light bill and [place] referral for EBT) Housing Interventions: Intervention Not Indicated Transportation Interventions: Intervention Not Indicated  Readmission Risk Interventions Readmission Risk Prevention Plan 02/01/2019  Transportation Screening Complete  PCP or Specialist Appt within 3-5 Days Complete  HRI or [place] Complete  Social Work Consult for [place] Planning/Counseling Patient refused  Palliative Care Screening Not Applicable  Medication Review Press photographer) Complete  Some recent data might be hidden    [place], MSW, [place] Social Worker

## 2021-01-08 NOTE — Progress Notes (Signed)
   Subjective:  Kim Anthony is a 70 y/o female with COPD, CHF, HTN, prior mitral valve replacement, hx of DVT/PE on chronic warfarin, and prior CABG, admitted for management and workup of COPD exacerbation and acute on chronic CHF.   Kim Anthony notes that she is feeling very well overall this morning. She is getting ready for her L/RHC later this morning. She endorses continued general improvement in her cough, dyspnea, and phlegm production. She denies any other new symptoms or concerns.  Objective:  Vital signs in last 24 hours: Vitals:   01/07/21 0840 01/07/21 1721 01/07/21 2206 01/08/21 0826  BP: (!) 141/92 123/66 134/81 131/76  Pulse: 71 69 68 64  Resp: 20 18 14 17   Temp: (!) 97.5 F (36.4 C) 97.6 F (36.4 C) 98.1 F (36.7 C) 98.2 F (36.8 C)  TempSrc: Oral Oral Oral   SpO2: 97% 92% 95% 94%  Weight:      Height:       Weight change:   Intake/Output Summary (Last 24 hours) at 01/08/2021 1140 Last data filed at 01/08/2021 1000 Gross per 24 hour  Intake 294.76 ml  Output 600 ml  Net -305.24 ml   General: Pleasant, well-appearing woman laying comfortably in bed at 30 degrees. No acute distress. Head: Normocephalic. Atraumatic. CV: RRR. No murmurs, rubs, or gallops. No LE edema Pulmonary: Lungs CTAB. Normal effort. No wheezing or rales. Abdominal: Soft, nontender, nondistended. Normal bowel sounds. Extremities: Palpable radial and DP pulses. Normal ROM. Skin: Warm and dry. No obvious rash or lesions. Neuro: A&Ox3. Moves all extremities. Normal sensation. No focal deficit. Psych: Normal mood and affect    Assessment/Plan:  Principal Problem:   COPD exacerbation (HCC) Active Problems:   Chronic combined systolic and diastolic congestive heart failure (HCC)   Dyspnea   Respiratory distress  COPD Exacerbation Presented w/ all cardinal criteria of COPD exacerbation w/ increased dyspnea, phlegm and cough on admission. Initial CXR showed diffuse interstitial markings  bilaterally. Has responded very well to guideline based therapy but difficult to ascribe therapeutic causation of therapy given current concern of possible CHF exacerbation treated in parallel with diuresis. -Continue last day of 250mg  Azithromycin today (total 3 days) -Continue last day of 40mg  Prednisone today (total 3 days) -Continue 13mL Duonebs q6 prn  CHF Exacerbation 01/06/21 ECHO revealed EF down to 35-40% from prior 01/20/19 EF of 55-60%. BNP at 209. TSH WNL. Pt w/ hx of prior CABG (pt unaware of this, likely occurring alongside prior MV replacement). Will be rescheduled for cath tomorrow AM, due to elevated INR >1.5 today after bridging to Heparin from Warfarin last night. -Continue 80mg  Furosemide BID -Monitoring strict I/O and daily weight -HTN therapy as below  HTN Fairly controlled today at 131/76 -Continue 25mg  Toprol-XL  -Continue 20mg  Lisinopril -Continue 12.5mg  Spiro MV Replacement and Prior PE On chronic anticoagulation with Warfarin. Transitioned to heparin last night in anticipation of this morning's cath. -Pharmacy managing anticoagulation  Chronic back and R wrist pain -Continued home meds of Norco 5-325 and 900mg  Gabapentin TID   LOS: 2 days   [place] Pepper, Medical Student 01/08/2021, 11:40 AM

## 2021-01-09 ENCOUNTER — Other Ambulatory Visit (HOSPITAL_COMMUNITY): Payer: Self-pay

## 2021-01-09 ENCOUNTER — Encounter (HOSPITAL_COMMUNITY): Payer: Self-pay | Admitting: Internal Medicine

## 2021-01-09 ENCOUNTER — Telehealth: Payer: Medicare Other

## 2021-01-09 ENCOUNTER — Inpatient Hospital Stay (HOSPITAL_COMMUNITY): Payer: Medicare Other

## 2021-01-09 ENCOUNTER — Encounter (HOSPITAL_COMMUNITY)
Admission: EM | Disposition: A | Discharge status: Home / Self Care | Payer: Self-pay | Source: Home / Self Care | Attending: Internal Medicine

## 2021-01-09 DIAGNOSIS — E7800 Pure hypercholesterolemia, unspecified: Secondary | ICD-10-CM

## 2021-01-09 DIAGNOSIS — I10 Essential (primary) hypertension: Secondary | ICD-10-CM | POA: Diagnosis not present

## 2021-01-09 DIAGNOSIS — Z952 Presence of prosthetic heart valve: Secondary | ICD-10-CM

## 2021-01-09 DIAGNOSIS — I5043 Acute on chronic combined systolic (congestive) and diastolic (congestive) heart failure: Secondary | ICD-10-CM | POA: Diagnosis not present

## 2021-01-09 HISTORY — PX: RIGHT/LEFT HEART CATH AND CORONARY ANGIOGRAPHY: CATH118266

## 2021-01-09 LAB — CBC
HCT: 44.4 % (ref 36.0–46.0)
Hemoglobin: 14.2 g/dL (ref 12.0–15.0)
MCH: 32.9 pg (ref 26.0–34.0)
MCHC: 32 g/dL (ref 30.0–36.0)
MCV: 102.8 fL — ABNORMAL HIGH (ref 80.0–100.0)
Platelets: 483 10*3/uL — ABNORMAL HIGH (ref 150–400)
RBC: 4.32 MIL/uL (ref 3.87–5.11)
RDW: 11.9 % (ref 11.5–15.5)
WBC: 13.8 10*3/uL — ABNORMAL HIGH (ref 4.0–10.5)
nRBC: 0 % (ref 0.0–0.2)

## 2021-01-09 LAB — POCT I-STAT EG7
Acid-Base Excess: 1 mmol/L (ref 0.0–2.0)
Acid-Base Excess: 2 mmol/L (ref 0.0–2.0)
Bicarbonate: 27.7 mmol/L (ref 20.0–28.0)
Bicarbonate: 28.4 mmol/L — ABNORMAL HIGH (ref 20.0–28.0)
Calcium, Ion: 1.32 mmol/L (ref 1.15–1.40)
Calcium, Ion: 1.28 mmol/L (ref 1.15–1.40)
HCT: 44 % (ref 36.0–46.0)
HCT: 44 % (ref 36.0–46.0)
Hemoglobin: 15 g/dL (ref 12.0–15.0)
Hemoglobin: 15 g/dL (ref 12.0–15.0)
O2 Saturation: 69 %
O2 Saturation: 72 %
Potassium: 3.8 mmol/L (ref 3.5–5.1)
Potassium: 3.8 mmol/L (ref 3.5–5.1)
Sodium: 142 mmol/L (ref 135–145)
Sodium: 142 mmol/L (ref 135–145)
TCO2: 29 mmol/L (ref 22–32)
TCO2: 30 mmol/L (ref 22–32)
pCO2, Ven: 50.5 mmHg (ref 44.0–60.0)
pCO2, Ven: 49.5 mmHg (ref 44.0–60.0)
pH, Ven: 7.347 (ref 7.250–7.430)
pH, Ven: 7.366 (ref 7.250–7.430)
pO2, Ven: 39 mmHg (ref 32.0–45.0)
pO2, Ven: 40 mmHg (ref 32.0–45.0)

## 2021-01-09 LAB — COMPREHENSIVE METABOLIC PANEL
ALT: 70 U/L — ABNORMAL HIGH (ref 0–44)
AST: 125 U/L — ABNORMAL HIGH (ref 15–41)
Albumin: 3.5 g/dL (ref 3.5–5.0)
Alkaline Phosphatase: 116 U/L (ref 38–126)
Anion gap: 12 (ref 5–15)
BUN: 29 mg/dL — ABNORMAL HIGH (ref 8–23)
CO2: 22 mmol/L (ref 22–32)
Calcium: 9.4 mg/dL (ref 8.9–10.3)
Chloride: 102 mmol/L (ref 98–111)
Creatinine, Ser: 0.8 mg/dL (ref 0.44–1.00)
GFR, Estimated: >60 mL/min (ref >60)
Glucose, Bld: 105 mg/dL — ABNORMAL HIGH (ref 70–99)
Potassium: 3.8 mmol/L (ref 3.5–5.1)
Sodium: 136 mmol/L (ref 135–145)
Total Bilirubin: 0.9 mg/dL (ref 0.3–1.2)
Total Protein: 7.3 g/dL (ref 6.5–8.1)

## 2021-01-09 LAB — POCT I-STAT 7, (LYTES, BLD GAS, ICA,H+H)
Acid-base deficit: 2 mmol/L (ref 0.0–2.0)
Bicarbonate: 24.1 mmol/L (ref 20.0–28.0)
Calcium, Ion: 1.2 mmol/L (ref 1.15–1.40)
HCT: 42 % (ref 36.0–46.0)
Hemoglobin: 14.3 g/dL (ref 12.0–15.0)
O2 Saturation: 95 %
Potassium: 3.4 mmol/L — ABNORMAL LOW (ref 3.5–5.1)
Sodium: 143 mmol/L (ref 135–145)
TCO2: 25 mmol/L (ref 22–32)
pCO2 arterial: 44.2 mmHg (ref 32.0–48.0)
pH, Arterial: 7.345 — ABNORMAL LOW (ref 7.350–7.450)
pO2, Arterial: 78 mmHg — ABNORMAL LOW (ref 83.0–108.0)

## 2021-01-09 LAB — PROTIME-INR
INR: 1.8 — ABNORMAL HIGH (ref 0.8–1.2)
Prothrombin Time: 20.5 seconds — ABNORMAL HIGH (ref 11.4–15.2)

## 2021-01-09 LAB — HEPARIN LEVEL (UNFRACTIONATED): Heparin Unfractionated: 0.38 IU/mL (ref 0.30–0.70)

## 2021-01-09 LAB — RHEUMATOID FACTOR: Rheumatoid fact SerPl-aCnc: <10 IU/mL (ref <14.0)

## 2021-01-09 SURGERY — RIGHT/LEFT HEART CATH AND CORONARY ANGIOGRAPHY
Anesthesia: LOCAL

## 2021-01-09 MED ORDER — MIDAZOLAM HCL 2 MG/2ML IJ SOLN
INTRAMUSCULAR | Status: AC
  Filled 2021-01-09: qty 2

## 2021-01-09 MED ORDER — IOHEXOL 350 MG/ML SOLN
INTRAVENOUS | Status: DC | PRN
  Administered 2021-01-09: 105 mL

## 2021-01-09 MED ORDER — VERAPAMIL HCL 2.5 MG/ML IV SOLN
INTRAVENOUS | Status: AC
  Filled 2021-01-09: qty 2

## 2021-01-09 MED ORDER — FENTANYL CITRATE (PF) 100 MCG/2ML IJ SOLN
INTRAMUSCULAR | Status: AC
  Filled 2021-01-09: qty 2

## 2021-01-09 MED ORDER — ACETAMINOPHEN 325 MG PO TABS: 650.0000 mg | ORAL_TABLET | ORAL | Status: DC | PRN

## 2021-01-09 MED ORDER — SODIUM CHLORIDE 0.9 % IV SOLN: INTRAVENOUS | Status: AC

## 2021-01-09 MED ORDER — HEPARIN SODIUM (PORCINE) 1000 UNIT/ML IJ SOLN
INTRAMUSCULAR | Status: AC
  Filled 2021-01-09: qty 1

## 2021-01-09 MED ORDER — HYDROCODONE-ACETAMINOPHEN 5-325 MG PO TABS
ORAL_TABLET | ORAL | Status: AC
  Filled 2021-01-09: qty 1

## 2021-01-09 MED ORDER — WARFARIN SODIUM 7.5 MG PO TABS
7.5000 mg | ORAL_TABLET | Freq: Once | ORAL | Status: AC
  Administered 2021-01-09: 7.5 mg via ORAL
  Filled 2021-01-09 (×2): qty 1

## 2021-01-09 MED ORDER — FENTANYL CITRATE (PF) 100 MCG/2ML IJ SOLN
INTRAMUSCULAR | Status: DC | PRN
  Administered 2021-01-09 (×4): 25 ug via INTRAVENOUS

## 2021-01-09 MED ORDER — HEPARIN SODIUM (PORCINE) 1000 UNIT/ML IJ SOLN
INTRAMUSCULAR | Status: DC | PRN
  Administered 2021-01-09: 4000 [IU] via INTRAVENOUS

## 2021-01-09 MED ORDER — LIDOCAINE HCL (PF) 1 % IJ SOLN
INTRAMUSCULAR | Status: DC | PRN
  Administered 2021-01-09 (×2): 2 mL

## 2021-01-09 MED ORDER — SODIUM CHLORIDE 0.9 % IV SOLN: 250.0000 mL | INTRAVENOUS | Status: DC | PRN

## 2021-01-09 MED ORDER — VERAPAMIL HCL 2.5 MG/ML IV SOLN
INTRAVENOUS | Status: DC | PRN
  Administered 2021-01-09: 10 mL via INTRA_ARTERIAL

## 2021-01-09 MED ORDER — SODIUM CHLORIDE 0.9% FLUSH: 3.0000 mL | INTRAVENOUS | Status: DC | PRN

## 2021-01-09 MED ORDER — ASPIRIN 81 MG PO CHEW
81.0000 mg | CHEWABLE_TABLET | ORAL | Status: AC
  Administered 2021-01-09: 81 mg via ORAL
  Filled 2021-01-09: qty 1

## 2021-01-09 MED ORDER — SPIRONOLACTONE 12.5 MG HALF TABLET
12.5000 mg | ORAL_TABLET | Freq: Every day | ORAL | Status: DC
  Administered 2021-01-09 – 2021-01-11 (×3): 12.5 mg via ORAL
  Filled 2021-01-09 (×3): qty 1

## 2021-01-09 MED ORDER — ONDANSETRON HCL 4 MG/2ML IJ SOLN: 4.0000 mg | Freq: Four times a day (QID) | INTRAMUSCULAR | Status: DC | PRN

## 2021-01-09 MED ORDER — LIDOCAINE HCL (PF) 1 % IJ SOLN
INTRAMUSCULAR | Status: AC
  Filled 2021-01-09: qty 30

## 2021-01-09 MED ORDER — SODIUM CHLORIDE 0.9% FLUSH
3.0000 mL | Freq: Two times a day (BID) | INTRAVENOUS | Status: DC
  Administered 2021-01-09 – 2021-01-10 (×3): 3 mL via INTRAVENOUS

## 2021-01-09 MED ORDER — MIDAZOLAM HCL 2 MG/2ML IJ SOLN
INTRAMUSCULAR | Status: DC | PRN
  Administered 2021-01-09 (×3): 1 mg via INTRAVENOUS

## 2021-01-09 MED ORDER — HEPARIN (PORCINE) IN NACL 1000-0.9 UT/500ML-% IV SOLN
INTRAVENOUS | Status: AC
  Filled 2021-01-09: qty 1000

## 2021-01-09 MED ORDER — HEPARIN (PORCINE) 25000 UT/250ML-% IV SOLN
1000.0000 [IU]/h | INTRAVENOUS | Status: AC
  Administered 2021-01-09: 900 [IU]/h via INTRAVENOUS
  Filled 2021-01-09: qty 250

## 2021-01-09 MED ORDER — LABETALOL HCL 5 MG/ML IV SOLN: 10.0000 mg | INTRAVENOUS | Status: AC | PRN

## 2021-01-09 MED ORDER — HYDRALAZINE HCL 20 MG/ML IJ SOLN: 10.0000 mg | INTRAMUSCULAR | Status: AC | PRN

## 2021-01-09 MED ORDER — HYDROCODONE-ACETAMINOPHEN 5-325 MG PO TABS: 1.0000 | ORAL_TABLET | ORAL | 0 refills | Status: DC | PRN

## 2021-01-09 MED ORDER — HEPARIN (PORCINE) IN NACL 1000-0.9 UT/500ML-% IV SOLN
INTRAVENOUS | Status: DC | PRN
  Administered 2021-01-09 (×2): 500 mL

## 2021-01-09 SURGICAL SUPPLY — 17 items
CATH 5FR JL3.5 JR4 ANG PIG MP (CATHETERS) ×2 IMPLANT
CATH BALLN WEDGE 5F 110CM (CATHETERS) ×2 IMPLANT
CATH INFINITI 5 FR 3DRC (CATHETERS) ×2 IMPLANT
CATH INFINITI 5 FR AR1 MOD (CATHETERS) ×2 IMPLANT
CATH INFINITI 5 FR LCB (CATHETERS) ×2 IMPLANT
CATH INFINITI 5 FR RCB (CATHETERS) IMPLANT
CATH INFINITI 5FR AL1 (CATHETERS) ×2 IMPLANT
DEVICE RAD COMP TR BAND LRG (VASCULAR PRODUCTS) ×2 IMPLANT
GLIDESHEATH SLEND SS 6F .021 (SHEATH) ×2 IMPLANT
GUIDEWIRE .025 260CM (WIRE) ×2 IMPLANT
GUIDEWIRE INQWIRE 1.5J.035X260 (WIRE) ×1 IMPLANT
INQWIRE 1.5J .035X260CM (WIRE) ×2
KIT MICROPUNCTURE NIT STIFF (SHEATH) ×2 IMPLANT
PACK CARDIAC CATHETERIZATION (CUSTOM PROCEDURE TRAY) ×2 IMPLANT
SHEATH GLIDE SLENDER 4/5FR (SHEATH) ×2 IMPLANT
SHEATH PROBE COVER 6X72 (BAG) ×2 IMPLANT
TRANSDUCER W/STOPCOCK (MISCELLANEOUS) ×2 IMPLANT

## 2021-01-09 NOTE — Progress Notes (Addendum)
Advanced Heart Failure Rounding Note  PCP-Cardiologist: None   Subjective:    Cath today with 1v CAD with occluded mRCA and stable hemodynamics  Denies CP or SOB. Back is sore. INR 1.8  Objective:   Weight Range: 85 kg Body mass index is 31.18 kg/m.   Vital Signs:   Temp:  [97.6 F (36.4 C)-98.1 F (36.7 C)] 97.6 F (36.4 C) (07/21 0913) Pulse Rate:  [56-77] 69 (07/21 1650) Resp:  [10-23] 20 (07/21 1650) BP: (83-173)/(56-111) 143/70 (07/21 1650) SpO2:  [90 %-100 %] 94 % (07/21 1650) Weight:  [85 kg] 85 kg (07/21 0913) Last BM Date: 01/08/21  Weight change: Filed Weights   01/05/21 1841 01/07/21 0635 01/09/21 0913  Weight: 80.3 kg 81.6 kg 85 kg    Intake/Output:   Intake/Output Summary (Last 24 hours) at 01/09/2021 1658 Last data filed at 01/09/2021 1147 Gross per 24 hour  Intake 1208.3 ml  Output 1900 ml  Net -691.7 ml       Physical Exam    General:  Well appearing. No resp difficulty HEENT: normal Neck: supple.JVP 8 Carotids 2+ bilat; no bruits. No lymphadenopathy or thryomegaly appreciated. Cor: PMI nondisplaced. Regular rate & rhythm. Mechanical s1 Lungs: clear Abdomen: obesesoft, nontender, nondistended. No hepatosplenomegaly. No bruits or masses. Good bowel sounds. Extremities: no cyanosis, clubbing, rash, edema Neuro: alert & orientedx3, cranial nerves grossly intact. moves all 4 extremities w/o difficulty. Affect pleasant   Telemetry  SR 60-70s Personally reviewed   Labs    CBC Recent Labs    01/08/21 0522 01/09/21 0306 01/09/21 1341 01/09/21 1348  WBC 11.2* 13.8*  --   --   HGB 13.5 14.2 14.3 15.0  HCT 42.3 44.4 42.0 44.0  MCV 102.7* 102.8*  --   --   PLT 443* 483*  --   --     Basic Metabolic Panel Recent Labs    01/06/21 2233 01/07/21 0727 01/07/21 0727 01/08/21 0522 01/09/21 0306 01/09/21 1341 01/09/21 1348  NA  --  136   < > 135 136 143 142  K  --  4.7   < > 4.0 3.8 3.4* 3.8  CL  --  110   < > 107 102  --   --    CO2  --  19*   < > 20* 22  --   --   GLUCOSE  --  84   < > 88 105*  --   --   BUN  --  17   < > 19 29*  --   --   CREATININE  --  0.69   < > 0.64 0.80  --   --   CALCIUM  --  8.9   < > 9.2 9.4  --   --   MG 1.6* 3.3*  --   --   --   --   --    < > = values in this interval not displayed.    Liver Function Tests Recent Labs    01/09/21 0306  AST 125*  ALT 70*  ALKPHOS 116  BILITOT 0.9  PROT 7.3  ALBUMIN 3.5    No results for input(s): LIPASE, AMYLASE in the last 72 hours. Cardiac Enzymes No results for input(s): CKTOTAL, CKMB, CKMBINDEX, TROPONINI in the last 72 hours.  BNP: BNP (last 3 results) Recent Labs    02/09/20 1145 08/26/20 1442 01/05/21 0857  BNP 131.5* 277.3* 209.8*     ProBNP (last 3 results) No  results for input(s): PROBNP in the last 8760 hours.   D-Dimer No results for input(s): DDIMER in the last 72 hours. Hemoglobin A1C No results for input(s): HGBA1C in the last 72 hours. Fasting Lipid Panel No results for input(s): CHOL, HDL, LDLCALC, TRIG, CHOLHDL, LDLDIRECT in the last 72 hours. Thyroid Function Tests Recent Labs    01/07/21 0727  TSH 2.775     Other results:   Imaging    CARDIAC CATHETERIZATION  Result Date: 01/09/2021 Formatting of this result is different from the original.   Mid RCA lesion is 100% stenosed.   Ost Cx to Prox Cx lesion is 30% stenosed.   Dist Cx lesion is 30% stenosed.   Dist LAD lesion is 40% stenosed. Findings: Ao = 157/85 (114) LV = 150/20 RA = 9 RV = 51/9 PA = 48/19 (32) PCW = 14 Fick cardiac output/index = 5.4/2.9 PVR = 3.3 WU FA sat = 95% PA sat = 73%, 69% Assessment: 1v CAD with chronic occlusion of mRCA Relatively well-compensated filling pressures with mild pulmonary HTN Normal cardiac output Difficult coronary ostia to engage due to tortuous subclavian artery. JL 3.5 use for left system LCB used for the RCA which had a high anterior takeoff Plan/Discussion: Medical therapy. Glori Bickers, MD 2:46  PM  DG CHEST PORT 1 VIEW  Result Date: 01/09/2021 CLINICAL DATA:  COPD exacerbation EXAM: PORTABLE CHEST 1 VIEW COMPARISON:  01/05/2021 FINDINGS: Cardiac shadow is enlarged. Postsurgical changes are again seen. Mild vascular congestion is noted although improved when compared with the prior exam. No focal confluent infiltrate is seen. IMPRESSION: Improving vascular congestion when compare with the prior study. Electronically Signed   By: Inez Catalina M.D.   On: 01/09/2021 13:29     Medications:     Scheduled Medications:  [MAR Hold] atorvastatin  40 mg Oral Daily   [MAR Hold] calcium-vitamin D  1 tablet Oral Q breakfast   [MAR Hold] furosemide  80 mg Oral BID   [MAR Hold] gabapentin  900 mg Oral TID   [MAR Hold] hydrALAZINE  12.5 mg Oral Q8H   [MAR Hold] isosorbide mononitrate  30 mg Oral Daily   [MAR Hold] loratadine  10 mg Oral Daily   [MAR Hold] metoprolol succinate  25 mg Oral Daily   [MAR Hold] nicotine  7 mg Transdermal Daily   sodium chloride flush  3 mL Intravenous Q12H   [MAR Hold] spironolactone  12.5 mg Oral Daily   warfarin  7.5 mg Oral Once   [MAR Hold] Warfarin - Pharmacist Dosing Inpatient   Does not apply q1600    Infusions:  [MAR Hold] sodium chloride Stopped (01/05/21 2309)   sodium chloride     sodium chloride 20 mL/hr at 01/09/21 1309   sodium chloride 50 mL/hr at 01/09/21 1459   heparin      PRN Medications: [MAR Hold] sodium chloride, sodium chloride, [MAR Hold] albuterol, [MAR Hold] HYDROcodone-acetaminophen, [MAR Hold] ipratropium-albuterol, [MAR Hold] ramelteon, sodium chloride flush   Assessment/Plan  Acute on chronic combined CHF: --NYHA class IV on admit. Chest x-ray with evidence of CHF -EF 35-40% this admit (I felt 40-45%), previously 55-60% in 2020. EF 45-50% in 2017 in setting of PE .  -Etiology for decline in EF not certain. Need to rule out obstructive CAD. Surgical changes suggestive of prior CABG (patient unaware of prior CABG) and  extensive calcification in native coronaries noted on CT chest from August 2021.   - Cath 7/21 with 1vCAD mRCA chronically occluded  and well compensated hemodynamics  - Switch lasix to po - Will titrate GDMT as tolerated  2. Acute respiratory failure with hypoxia: -Probably secondary to combination of acute on chronic CHF and COPD exacerbation -Intermittently required 2L 02 [place]-Sats stable.  -COPD exacerbation treated by primary team  3. Hx of mechanical mitral valve replacement: -Mechanical MVR in [place] in 2001 -mean gradient of 4 mmHg with no significant MR on echo this admit -INR 2.2  - Off warfarin and on heparin drip.  - resume heparin post-cath. Restart warfarin. Start ASA 81 - home when INR 2.5 or greater   4. Tricuspid valve regurgitation: -Moderate in severity on echo   5. Hx DVT/PE: -Place SCDs.  [facility] plan as above   6. HTN: Stable.    7. Smoker: -Smoking cessation discussed -wearing patch   8. CAD  - On Simvastatin. - High intensity statin therapy. Switched to 40 mg atorvastatin. - No s/s angina. Medical therapy      SDOH: -No transportation needs. Lives independently. -Has Medicare.  No difficulty obtaining meds. Will review HF meds with pharmacist.   Length of Stay: 3  Glori Bickers, MD  01/09/2021, 4:58 PM  Advanced Heart Failure Team Pager [phone] (M-F; 7a - 5p)  Please contact [facility] for night-coverage after hours (5p -7a ) and weekends on [email or web address]

## 2021-01-09 NOTE — Progress Notes (Signed)
   Subjective:  Kim Anthony is a 70 y/o female with COPD, CHF, HTN, HLD, prior CABG, prior MV replacement, DVT/PE on chronic anticoagulation admitted for acute on chronic CHF.   Kim Anthony notes that she is continuing to feel well and notes continued improvement of her cough and SOB. She denies developing any other concerns and is awaiting her R/LHC later today.   Objective:  Vital signs in last 24 hours: Vitals:   01/08/21 1831 01/08/21 2052 01/09/21 0503 01/09/21 0913  BP: (!) 106/56 127/81 119/79 135/74  Pulse: 66 69 69 62  Resp: 18 18 18 16   Temp: 98.1 F (36.7 C) 98 F (36.7 C) 97.7 F (36.5 C) 97.6 F (36.4 C)  TempSrc:   Oral Oral  SpO2: 99% 95% 95% 94%  Weight:      Height:       Weight change:   Intake/Output Summary (Last 24 hours) at 01/09/2021 1046 Last data filed at 01/09/2021 0900 Gross per 24 hour  Intake 1415.3 ml  Output 1900 ml  Net -484.7 ml   General: Pleasant, well-appearing female laying comfortably in bed. No acute distress. Head: Normocephalic. Atraumatic. CV: RRR. No murmurs, rubs, or gallops. No LE edema Pulmonary: Soft end-inspiratory crackles. Normal effort. No wheezing. Abdominal: Soft, nontender, nondistended. Normal bowel sounds. Extremities: Palpable radial and DP pulses. Normal ROM. Skin: Warm and dry. No obvious rash or lesions. Neuro: A&Ox3. Moves all extremities. Normal sensation. No focal deficit. Psych: Normal mood and affect    Assessment/Plan:  Principal Problem:   COPD exacerbation (HCC) Active Problems:   Chronic combined systolic and diastolic congestive heart failure (HCC)   Dyspnea   Respiratory distress  Acute on Chronic combined CHF exacerbation 01/06/21 ECHO revealed EF down to 35-40% from prior 01/20/19 EF of 55-60%. BNP at 209. TSH WNL. Pt w/ hx of CABG. Undergoing cath today. Creatinine stable but BUN trending up. Appears compensated after multiple days of diuresis. -Will consider decreasing 80mg  Furosemide BID to  40mg  BID pending heart cath evaluation -Monitoring strict I/O and daily weight -HTN therapy as below  COPD exacerbation Pt without previous COPD exacerbations but admitted w/ all cardinal criteria of COPD exacerbation w/ increased dyspnea, phlegm production and cough. Initial CXR showed diffuse interstitial markings b/l. Has responded very well to guideline based therapy of 3 days of Azithromycin and prednisone, but difficult to ascribe therapeutic causation given concomitant CHF exacerbation. Given impact of COPD exacerbation on GOLD criteria, will repeat CXR to more definitively describe her acute respiratory failure as a true COPD exacerbation vs isolated CHF exacerbation. -CXR today  Isolated transaminates AST at 125 and ALT at 70, increased from admission values of AST at 53, ALT at 23, all in the setting of normal bilirubin and alk phos. Pt was increased from her home regimen of 20mg  Simvastatin to 80mg  Atorvastatin yesterday morning, which is possible etiology of early DILI. Prior Hep C antibody from 2017 was normal. Pt without overt risk factors. Will explore chronic hep etiologies today and if negative, consider decreasing statin. -Hep B lab work up   HTN Well-controlled today, ranging from 119-135/74-79. -25mg  Toprol-XL -20mg  Lisinopril -12.5mg  Spironolactone  MV Replacement and prior PE On chronic anticoagulation with warfarin. Transitioned to heparin for [facility] today.  -Pharmacy managing anticoagulation Chronic back and R wrist pain -Continued home meds of Norco 5-325 and 900mg  Gabapentin TID    LOS: 3 days   London Pepper, Medical Student 01/09/2021, 10:46 AM

## 2021-01-09 NOTE — Progress Notes (Signed)
ANTICOAGULATION CONSULT NOTE - Initial Consult  Pharmacy Consult for Warfarin, Lovenox > Heparin Indication:  Hx of DVT  with mechanical valve  Allergies  Allergen Reactions   Lisinopril Swelling   Acetaminophen-Codeine Itching   Propoxyphene Itching    Darvocet   Tape Rash    Plastic   Tramadol Itching and Nausea And Vomiting    Patient Measurements: Height: 5\' 5"  (165.1 cm) Weight: 85 kg (187 lb 6.3 oz) IBW/kg (Calculated) : 57  Vital Signs: Temp: 97.6 F (36.4 C) (07/21 0913) Temp Source: Oral (07/21 0913) BP: 149/92 (07/21 1426) Pulse Rate: 69 (07/21 1426)  Labs: Recent Labs    01/06/21 1547 01/07/21 0727 01/07/21 0727 01/08/21 0522 01/09/21 0306 01/09/21 0939  HGB  --  13.7   < > 13.5 14.2  --   HCT  --  43.0  --  42.3 44.4  --   PLT  --  425*  --  443* 483*  --   LABPROT  --  22.1*  --  24.6* 20.5*  --   INR  --  1.9*  --  2.2* 1.8*  --   HEPARINUNFRC  --   --   --  0.63  --  0.38  CREATININE  --  0.69  --  0.64 0.80  --   TROPONINIHS 12  --   --   --   --   --    < > = values in this interval not displayed.     Estimated Creatinine Clearance: 69.4 mL/min (by C-G formula based on SCr of 0.8 mg/dL).   Medical History: Past Medical History:  Diagnosis Date   Arthritis    Asthma    Breast cancer ([place]) 2001   Left Breast Cancer   CHF (congestive heart failure) (HCC)    COPD (chronic obstructive pulmonary disease) (HCC)    Coronary artery disease    History of mitral valve replacement with mechanical valve    HLD (hyperlipidemia)    Hypertension    Pulmonary embolism (HCC)     Medications:  Medications Prior to Admission  Medication Sig Dispense Refill Last Dose   albuterol (PROVENTIL) (2.5 MG/3ML) 0.083% nebulizer solution USE 1 VIAL IN NEBULIZER EVERY 6 HOURS AS NEEDED FOR WHEEZING FOR SHORTNESS OF BREATH (Patient taking differently: Take 2.5 mg by nebulization every 6 (six) hours as needed for wheezing or shortness of breath.) 90 mL 0  01/05/2021   albuterol (VENTOLIN HFA) 108 (90 Base) MCG/ACT inhaler Inhale 1-2 puffs into the lungs every 6 (six) hours as needed for wheezing or shortness of breath. 6.7 g 11 01/05/2021   Calcium-Phosphorus-Vitamin D (CITRACAL +D3 PO) Take 1 tablet by mouth 2 (two) times a day.   Past Week   cetirizine (ZYRTEC ALLERGY) 10 MG tablet Take 1 tablet (10 mg total) by mouth daily. 30 tablet 2 Past Week   fluticasone (FLONASE) 50 MCG/ACT nasal spray Place 1 spray into both nostrils daily as needed for allergies. 16 each 11 01/05/2021   furosemide (LASIX) 40 MG tablet Take 1 tablet by mouth twice daily 60 tablet 2 01/05/2021   gabapentin (NEURONTIN) 600 MG tablet Take 1.5 tablets (900 mg total) by mouth 3 (three) times daily. For nerve pain- increased to get her off lyrica 150 tablet 5 Past Week   metoprolol succinate (TOPROL-XL) 25 MG 24 hr tablet Take 1 tablet (25 mg total) by mouth daily. 90 tablet 3 01/05/2021 at 0800   potassium chloride SA (KLOR-CON) 20 MEQ tablet Take  2 tablets by mouth once daily (Patient taking differently: Take 40 mEq by mouth daily.) 60 tablet 0 01/05/2021   simvastatin (ZOCOR) 20 MG tablet Take 1 tablet by mouth once daily 90 tablet 0 01/05/2021   STIOLTO RESPIMAT 2.5-2.5 MCG/ACT AERS INHALE 2 PUFFS BY MOUTH ONCE DAILY (Patient taking differently: Take 2 puffs by mouth daily.) 4 g 0 01/05/2021   warfarin (COUMADIN) 5 MG tablet TAKE 1 TABLET BY MOUTH ONCE DAILY EXCEPT  WEDNESDAYS  TAKE  ONE  HALF  TABLET (Patient taking differently: Take 2.5-5 mg by mouth See admin instructions. Take one (1) tablet of your 5mg  peach-colored warfarin tablets, once-daily at 6PM--EXCEPT on Wednesdays, take ONLY one-half (1/2) tablet on these days.) 30 tablet 0 01/04/2021 at 1800   Spacer/Aero-Holding Chambers (BREATHERITE COLL SPACER ADULT) MISC 1 applicator by Does not apply route daily. 1 each 0    Scheduled:   [MAR Hold] atorvastatin  40 mg Oral Daily   [MAR Hold] calcium-vitamin D  1 tablet Oral Q  breakfast   [MAR Hold] furosemide  80 mg Oral BID   [MAR Hold] gabapentin  900 mg Oral TID   [MAR Hold] hydrALAZINE  12.5 mg Oral Q8H   [MAR Hold] isosorbide mononitrate  30 mg Oral Daily   [MAR Hold] loratadine  10 mg Oral Daily   [MAR Hold] metoprolol succinate  25 mg Oral Daily   [MAR Hold] nicotine  7 mg Transdermal Daily   sodium chloride flush  3 mL Intravenous Q12H   [MAR Hold] spironolactone  12.5 mg Oral Daily   [MAR Hold] Warfarin - Pharmacist Dosing Inpatient   Does not apply q1600   Infusions:   [MAR Hold] sodium chloride Stopped (01/05/21 2309)   sodium chloride     sodium chloride 20 mL/hr at 01/09/21 1309   heparin Stopped (01/09/21 1309)    Assessment: 72 yof presenting to the ED with SOB. Warfarin consult for Hx of DVT in setting of mechanical valve. INR below goal at 1.9 (1.9>1.6>1.9>2.2). Bridging with Lovenox > will change to heparin drip given plan for R/L heart cath post poned to 7/21 with INR elevated  INR 1.8 ok for cath Heparin drip 900 uts/hr heparin level at goal 0.38 at goal CBC stable   Patient on warfarin takes 5mg  every day except Wednesday take 2.5 mg.   Goal of Therapy:  Heparin level 0.3-0.7 INR goal 2.5-3.5 Monitor platelets by anticoagulation protocol: Yes   Plan:  Resume Warfarin tonight 7.5mg  x1  Heparin drip 900 uts/hr  - resume 8hr after sheath out At 11pm 7/21 Daily INR, CBC and heparin level   Beaver.D. CPP, BCPS Clinical Pharmacist [phone] 01/09/2021 2:41 PM   Please utilize Amion for appropriate phone number to reach the unit pharmacist ([place])

## 2021-01-09 NOTE — Interval H&P Note (Signed)
History and Physical Interval Note:  01/09/2021 1:14 PM  Kim Anthony  has presented today for surgery, with the diagnosis of heart failure.  The various methods of treatment have been discussed with the patient and family. After consideration of risks, benefits and other options for treatment, the patient has consented to  Procedure(s): RIGHT/LEFT HEART CATH AND CORONARY ANGIOGRAPHY (N/A) and possible coronary angioplasty as a surgical intervention.  The patient's history has been reviewed, patient examined, no change in status, stable for surgery.  I have reviewed the patient's chart and labs.  Questions were answered to the patient's satisfaction.     Kim Anthony

## 2021-01-09 NOTE — Care Management Important Message (Signed)
Important Message  Patient Details  Name: Kim Anthony [name]: [id number] Date of Birth: 02/19/1951   Medicare Important Message Given:  Yes     Varvara Legault Montine Circle 01/09/2021, 4:06 PM

## 2021-01-09 NOTE — Plan of Care (Signed)
  Problem: Education: Goal: Knowledge of General Education information will improve Description: Including pain rating scale, medication(s)/side effects and non-pharmacologic comfort measures Outcome: Progressing   Problem: Clinical Measurements: Goal: Diagnostic test results will improve Outcome: Progressing   Problem: Coping: Goal: Level of anxiety will decrease Outcome: Progressing   

## 2021-01-09 NOTE — Progress Notes (Signed)
TR BAND REMOVAL  LOCATION:    radial rt radial   DEFLATED PER PROTOCOL:   yes  TIME BAND OFF / DRESSING APPLIED:    1700/gauze and tegaderm  SITE UPON ARRIVAL:    Level 0  SITE AFTER BAND REMOVAL:    Level 0  CIRCULATION SENSATION AND MOVEMENT:    Within Normal Limits : yes, palpable rt radial pulse, rt hand and fingers warm and pink, elevated on pillow. Instructions reviewed w/patient  COMMENTS:

## 2021-01-09 NOTE — Progress Notes (Deleted)
Advanced Heart Failure Rounding Note   Subjective:   Heart cath cancelled yesterday d/t INR 2.2. 1.8 today. Remains on heparin drip.   No weights last 2 days. Multiple unmeasured voids. Dyspnea improved. Episode of chest heaviness last night (different than usual chest wall pain).    CARDIAC STUDIES: Echo 07/18: EF 40-45% with moderate RV dysfunction (per personal review by Dr. Jeffie Pollock)  Objective:   Weight Range: 81.6 kg Body mass index is 29.94 kg/m.   Vital Signs:   Temp:  [97.6 F (36.4 C)-98.1 F (36.7 C)] 97.6 F (36.4 C) (07/21 0913) Pulse Rate:  [62-69] 62 (07/21 0913) Resp:  [16-18] 16 (07/21 0913) BP: (106-135)/(56-81) 135/74 (07/21 0913) SpO2:  [94 %-99 %] 94 % (07/21 0913) Last BM Date: 01/08/21  Weight change: Filed Weights   01/05/21 0800 01/05/21 1841 01/07/21 0635  Weight: 83.9 kg 80.3 kg 81.6 kg    Intake/Output:   Intake/Output Summary (Last 24 hours) at 01/09/2021 0918 Last data filed at 01/09/2021 0841 Gross per 24 hour  Intake 1547.06 ml  Output 1900 ml  Net -352.94 ml      Physical Exam    General:  Well appearing. No resp difficulty HEENT: Normal Neck: Supple. JVP 5-6. Carotids 2+ bilat; no bruits. No lymphadenopathy or thyromegaly appreciated. Cor: PMI nondisplaced. Mechanical S1. Regular rate & rhythm. No rubs, gallops.  Lungs: CTA bilaterally Abdomen: Soft, nontender, nondistended. No hepatosplenomegaly. No bruits or masses. Good bowel sounds. Extremities: No cyanosis, clubbing, rash, edema Neuro: Alert & orientedx3, cranial nerves grossly intact. moves all 4 extremities w/o difficulty. Affect pleasant   Telemetry  SR 60-70s. < 5 PVCs/min (personally reviewed)  EKG    N/A  Labs    CBC Recent Labs    01/08/21 0522 01/09/21 0306  WBC 11.2* 13.8*  HGB 13.5 14.2  HCT 42.3 44.4  MCV 102.7* 102.8*  PLT 443* 355*   Basic Metabolic Panel Recent Labs    01/06/21 2233 01/07/21 0727 01/08/21 0522 01/09/21 0306   NA  --  136 135 136  K  --  4.7 4.0 3.8  CL  --  110 107 102  CO2  --  19* 20* 22  GLUCOSE  --  84 88 105*  BUN  --  17 19 29*  CREATININE  --  0.69 0.64 0.80  CALCIUM  --  8.9 9.2 9.4  MG 1.6* 3.3*  --   --    Liver Function Tests Recent Labs    01/09/21 0306  AST 125*  ALT 70*  ALKPHOS 116  BILITOT 0.9  PROT 7.3  ALBUMIN 3.5   No results for input(s): LIPASE, AMYLASE in the last 72 hours. Cardiac Enzymes No results for input(s): CKTOTAL, CKMB, CKMBINDEX, TROPONINI in the last 72 hours.  BNP: BNP (last 3 results) Recent Labs    02/09/20 1145 08/26/20 1442 01/05/21 0857  BNP 131.5* 277.3* 209.8*    ProBNP (last 3 results) No results for input(s): PROBNP in the last 8760 hours.   D-Dimer No results for input(s): DDIMER in the last 72 hours. Hemoglobin A1C No results for input(s): HGBA1C in the last 72 hours. Fasting Lipid Panel No results for input(s): CHOL, HDL, LDLCALC, TRIG, CHOLHDL, LDLDIRECT in the last 72 hours. Thyroid Function Tests Recent Labs    01/07/21 0727  TSH 2.775    Other results:   Imaging    No results found.   Medications:     Scheduled Medications:  atorvastatin  40  mg Oral Daily   calcium-vitamin D  1 tablet Oral Q breakfast   furosemide  80 mg Oral BID   gabapentin  900 mg Oral TID   hydrALAZINE  12.5 mg Oral Q8H   isosorbide mononitrate  30 mg Oral Daily   loratadine  10 mg Oral Daily   metoprolol succinate  25 mg Oral Daily   nicotine  7 mg Transdermal Daily   sodium chloride flush  3 mL Intravenous Q12H   Warfarin - Pharmacist Dosing Inpatient   Does not apply q1600    Infusions:  sodium chloride Stopped (01/05/21 2309)   sodium chloride     sodium chloride 20 mL/hr at 01/09/21 0841   heparin 900 Units/hr (01/09/21 0841)    PRN Medications: sodium chloride, sodium chloride, albuterol, HYDROcodone-acetaminophen, ipratropium-albuterol, ramelteon, sodium chloride flush   Assessment/Plan  Acute on chronic  combined systolic and diastolic CHF: --NYHA class IV on admit. Chest x-ray with evidence of CHF -EF 40-45% this admit, previously 55-60% in 2020. EF 45-50% in 2017 in setting of PE .  -Etiology for decline in EF not certain. Need to rule out obstructive CAD. Extensive calcification in native coronaries noted on CT chest from August 2021.  No evidence of CABG. -Cath cancelled yesterday d/t INR 2.2, 1.8 today. Will proceed with [facility] today. -Appears compensated. On 80 mg furosemide po BID. Creatinine stable. BUN trending up. Consider backing off diuretics to 40 mg furosemide po BID depending on results of RHC. -On metoprolol xl 25 mg daily -Had been on ACE-I previously. Stopped after seen in ED in April 2022 with facial swelling and possible angioedema. ARNi contraindicated.  -Add spiro 12.5 mg daily -Continue 30 mg Imur daily and 12.5 mg hydralazine tid.  -Consider SGLT2i tomorrow -CR  2. Acute respiratory failure with hypoxia: -Probably secondary to combination of acute on chronic CHF and COPD exacerbation -Intermittently required 2L 02 [place]-Sats stable.  -COPD exacerbation treated by primary team  3. Hx of mechanical mitral valve replacement: -Mechanical MVR in [place] in 2001 -mean gradient of 4 mmHg with no significant MR on echo this admit -INR 1.8 - Off warfarin and on heparin drip.    4. Tricuspid valve regurgitation: -Moderate in severity on echo   5. Hx DVT/PE: - SCDs.  - On heparin drip.    6. HTN: Stable.    7. Smoker: -Smoking cessation discussed -wearing patch   8. Hyperlipidemia: -Switched Simvastatin to 40 mg atorvastatin d/t CAD.   SDOH: -No transportation needs. Lives independently.  -TOC assisting with application for EBT and SNAP. -Has Medicare.  No difficulty obtaining meds. Will review HF meds with pharmacist.   Length of Stay: 3  Ayme Short N, PA-C  01/09/2021, 9:18 AM

## 2021-01-10 ENCOUNTER — Other Ambulatory Visit (HOSPITAL_COMMUNITY): Payer: Self-pay

## 2021-01-10 ENCOUNTER — Encounter (HOSPITAL_COMMUNITY): Payer: Self-pay | Admitting: Internal Medicine

## 2021-01-10 ENCOUNTER — Other Ambulatory Visit: Payer: Self-pay | Admitting: Student

## 2021-01-10 DIAGNOSIS — I10 Essential (primary) hypertension: Secondary | ICD-10-CM | POA: Diagnosis not present

## 2021-01-10 DIAGNOSIS — E7800 Pure hypercholesterolemia, unspecified: Secondary | ICD-10-CM | POA: Diagnosis not present

## 2021-01-10 DIAGNOSIS — I5043 Acute on chronic combined systolic (congestive) and diastolic (congestive) heart failure: Secondary | ICD-10-CM | POA: Diagnosis not present

## 2021-01-10 DIAGNOSIS — Z952 Presence of prosthetic heart valve: Secondary | ICD-10-CM | POA: Diagnosis not present

## 2021-01-10 DIAGNOSIS — J449 Chronic obstructive pulmonary disease, unspecified: Secondary | ICD-10-CM

## 2021-01-10 LAB — CBC
HCT: 43.4 % (ref 36.0–46.0)
Hemoglobin: 13.9 g/dL (ref 12.0–15.0)
MCH: 32.8 pg (ref 26.0–34.0)
MCHC: 32 g/dL (ref 30.0–36.0)
MCV: 102.4 fL — ABNORMAL HIGH (ref 80.0–100.0)
Platelets: 473 10*3/uL — ABNORMAL HIGH (ref 150–400)
RBC: 4.24 MIL/uL (ref 3.87–5.11)
RDW: 12.1 % (ref 11.5–15.5)
WBC: 10.3 10*3/uL (ref 4.0–10.5)
nRBC: 0 % (ref 0.0–0.2)

## 2021-01-10 LAB — HEPARIN LEVEL (UNFRACTIONATED)
Heparin Unfractionated: 0.23 [IU]/mL — ABNORMAL LOW (ref 0.30–0.70)
Heparin Unfractionated: 0.39 IU/mL (ref 0.30–0.70)

## 2021-01-10 LAB — DIFFERENTIAL
Abs Immature Granulocytes: 0.1 10*3/uL — ABNORMAL HIGH (ref 0.00–0.07)
Basophils Absolute: 0.1 10*3/uL (ref 0.0–0.1)
Basophils Relative: 1 %
Eosinophils Absolute: 0.4 10*3/uL (ref 0.0–0.5)
Eosinophils Relative: 4 %
Immature Granulocytes: 1 %
Lymphocytes Relative: 30 %
Lymphs Abs: 3.1 10*3/uL (ref 0.7–4.0)
Monocytes Absolute: 0.8 10*3/uL (ref 0.1–1.0)
Monocytes Relative: 8 %
Neutro Abs: 5.7 10*3/uL (ref 1.7–7.7)
Neutrophils Relative %: 56 %

## 2021-01-10 LAB — PROTIME-INR
INR: 1.5 — ABNORMAL HIGH (ref 0.8–1.2)
Prothrombin Time: 18.1 seconds — ABNORMAL HIGH (ref 11.4–15.2)

## 2021-01-10 LAB — BASIC METABOLIC PANEL
Anion gap: 11 (ref 5–15)
BUN: 19 mg/dL (ref 8–23)
CO2: 22 mmol/L (ref 22–32)
Calcium: 9.4 mg/dL (ref 8.9–10.3)
Chloride: 106 mmol/L (ref 98–111)
Creatinine, Ser: 0.74 mg/dL (ref 0.44–1.00)
GFR, Estimated: >60 mL/min (ref >60)
Glucose, Bld: 127 mg/dL — ABNORMAL HIGH (ref 70–99)
Potassium: 3.3 mmol/L — ABNORMAL LOW (ref 3.5–5.1)
Sodium: 139 mmol/L (ref 135–145)

## 2021-01-10 LAB — LIPID PANEL
Cholesterol: 156 mg/dL (ref 0–200)
HDL: 54 mg/dL
LDL Cholesterol: 76 mg/dL (ref 0–99)
Total CHOL/HDL Ratio: 2.9 ratio
Triglycerides: 132 mg/dL
VLDL: 26 mg/dL (ref 0–40)

## 2021-01-10 LAB — HEPATIC FUNCTION PANEL
ALT: 121 U/L — ABNORMAL HIGH (ref 0–44)
AST: 179 U/L — ABNORMAL HIGH (ref 15–41)
Albumin: 3.3 g/dL — ABNORMAL LOW (ref 3.5–5.0)
Alkaline Phosphatase: 103 U/L (ref 38–126)
Bilirubin, Direct: 0.2 mg/dL (ref 0.0–0.2)
Indirect Bilirubin: 0.6 mg/dL (ref 0.3–0.9)
Total Bilirubin: 0.8 mg/dL (ref 0.3–1.2)
Total Protein: 7.1 g/dL (ref 6.5–8.1)

## 2021-01-10 LAB — HEPATITIS B CORE ANTIBODY, IGM: Hep B C IgM: NONREACTIVE

## 2021-01-10 LAB — HEPATITIS B SURFACE ANTIGEN: Hepatitis B Surface Ag: NONREACTIVE

## 2021-01-10 LAB — MAGNESIUM: Magnesium: 1.7 mg/dL (ref 1.7–2.4)

## 2021-01-10 LAB — CYCLIC CITRUL PEPTIDE ANTIBODY, IGG/IGA: CCP Antibodies IgG/IgA: 7 units (ref 0–19)

## 2021-01-10 MED ORDER — DAPAGLIFLOZIN PROPANEDIOL 10 MG PO TABS
10.0000 mg | ORAL_TABLET | Freq: Every day | ORAL | Status: DC
  Administered 2021-01-11: 10 mg via ORAL
  Filled 2021-01-10: qty 1

## 2021-01-10 MED ORDER — ENOXAPARIN SODIUM 80 MG/0.8ML IJ SOSY
80.0000 mg | PREFILLED_SYRINGE | Freq: Two times a day (BID) | INTRAMUSCULAR | Status: DC
Start: 2021-01-10 — End: 2021-01-10

## 2021-01-10 MED ORDER — MAGNESIUM OXIDE -MG SUPPLEMENT 400 (240 MG) MG PO TABS
400.0000 mg | ORAL_TABLET | Freq: Every day | ORAL | Status: DC
  Administered 2021-01-10 – 2021-01-11 (×2): 400 mg via ORAL
  Filled 2021-01-10 (×2): qty 1

## 2021-01-10 MED ORDER — POTASSIUM CHLORIDE CRYS ER 20 MEQ PO TBCR
40.0000 meq | EXTENDED_RELEASE_TABLET | Freq: Two times a day (BID) | ORAL | Status: AC
  Administered 2021-01-10 (×2): 40 meq via ORAL
  Filled 2021-01-10 (×2): qty 2

## 2021-01-10 MED ORDER — WARFARIN SODIUM 7.5 MG PO TABS
7.5000 mg | ORAL_TABLET | Freq: Once | ORAL | Status: AC
  Administered 2021-01-10: 7.5 mg via ORAL
  Filled 2021-01-10: qty 1

## 2021-01-10 MED ORDER — ASPIRIN 81 MG PO CHEW
81.0000 mg | CHEWABLE_TABLET | Freq: Every day | ORAL | Status: DC
  Administered 2021-01-10 – 2021-01-11 (×2): 81 mg via ORAL
  Filled 2021-01-10 (×2): qty 1

## 2021-01-10 MED ORDER — EMPAGLIFLOZIN 10 MG PO TABS
10.0000 mg | ORAL_TABLET | Freq: Every day | ORAL | Status: DC
  Administered 2021-01-10: 10 mg via ORAL
  Filled 2021-01-10: qty 1

## 2021-01-10 MED ORDER — ENOXAPARIN SODIUM 80 MG/0.8ML IJ SOSY
80.0000 mg | PREFILLED_SYRINGE | Freq: Two times a day (BID) | INTRAMUSCULAR | Status: DC
  Administered 2021-01-10 – 2021-01-11 (×2): 80 mg via SUBCUTANEOUS
  Filled 2021-01-10 (×2): qty 0.8

## 2021-01-10 MED ORDER — FUROSEMIDE 40 MG PO TABS
40.0000 mg | ORAL_TABLET | Freq: Two times a day (BID) | ORAL | Status: DC
  Administered 2021-01-10 – 2021-01-11 (×2): 40 mg via ORAL
  Filled 2021-01-10 (×2): qty 1

## 2021-01-10 MED ORDER — MAGNESIUM SULFATE 2 GM/50ML IV SOLN
2.0000 g | Freq: Once | INTRAVENOUS | Status: AC
  Administered 2021-01-10: 2 g via INTRAVENOUS
  Filled 2021-01-10: qty 50

## 2021-01-10 NOTE — Progress Notes (Signed)
CARDIAC REHAB PHASE I   HF ed reviewed with pt. Pt asking appropriate questions. States understanding of importance of daily weights and monitoring symptoms. Pt appreciative of care she has received, excited to be going home.   [id number] Rufina Falco, RN BSN 01/10/2021 11:43 AM

## 2021-01-10 NOTE — Plan of Care (Signed)
°  Problem: Education: °Goal: Knowledge of General Education information will improve °Description: Including pain rating scale, medication(s)/side effects and non-pharmacologic comfort measures °Outcome: Progressing °  °Problem: Health Behavior/Discharge Planning: °Goal: Ability to manage health-related needs will improve °Outcome: Progressing °  °Problem: Clinical Measurements: °Goal: Ability to maintain clinical measurements within normal limits will improve °Outcome: Progressing °Goal: Respiratory complications will improve °Outcome: Progressing °  °Problem: Coping: °Goal: Level of anxiety will decrease °Outcome: Progressing °  °Problem: Pain Managment: °Goal: General experience of comfort will improve °Outcome: Progressing °  °

## 2021-01-10 NOTE — Progress Notes (Signed)
[place] for Heparin Indication:  Hx of DVT  with mechanical valve  Allergies  Allergen Reactions   Lisinopril Swelling   Acetaminophen-Codeine Itching   Propoxyphene Itching    Darvocet   Tape Rash    Plastic   Tramadol Itching and Nausea And Vomiting    Patient Measurements: Height: '5\' 5"'$  (165.1 cm) Weight: 85 kg (187 lb 6.3 oz) IBW/kg (Calculated) : 57  Vital Signs: Temp: 98.7 F (37.1 C) (07/22 0557) Temp Source: Oral (07/22 0557) BP: 159/99 (07/22 0557) Pulse Rate: 75 (07/22 0557)  Labs: Recent Labs    01/07/21 0727 01/08/21 0522 01/09/21 0306 01/09/21 0939 01/09/21 1341 01/09/21 1348 01/10/21 0544  HGB 13.7 13.5 14.2  --  14.3 15.0  15.0 13.9  HCT 43.0 42.3 44.4  --  42.0 44.0  44.0 43.4  PLT 425* 443* 483*  --   --   --  473*  LABPROT 22.1* 24.6* 20.5*  --   --   --  18.1*  INR 1.9* 2.2* 1.8*  --   --   --  1.5*  HEPARINUNFRC  --  0.63  --  0.38  --   --  0.23*  CREATININE 0.69 0.64 0.80  --   --   --   --      Estimated Creatinine Clearance: 69.4 mL/min (by C-G formula based on SCr of 0.8 mg/dL).   Assessment: 62 yof presenting to the ED with SOB. Warfarin consult for Hx of DVT in setting of mechanical valve. Bridging with heparin. Patient on warfarin takes '5mg'$  every day except Wednesday take 2.5 mg.   Heparin level subtherapeutic (0.23) on gtt at 900 uts/hr. Lab was drawn ~6hr post heparin gtt restart. No issues with line or bleeding reported per RN.  Goal of Therapy:  Heparin level 0.3-0.7 INR goal 2.5-3.5 Monitor platelets by anticoagulation protocol: Yes   Plan:  Increase heparin slightly to 1000 units/hr. F/u 8 hr heparin level to confirm back in therapeutic range  Sherlon Handing, PharmD, BCPS Please see amion for complete clinical pharmacist phone list 01/10/2021 6:21 AM

## 2021-01-10 NOTE — Progress Notes (Signed)
[place] for Heparin Indication:  Hx of DVT  with mechanical valve  Allergies  Allergen Reactions   Lisinopril Swelling   Acetaminophen-Codeine Itching   Propoxyphene Itching    Darvocet   Tape Rash    Plastic   Tramadol Itching and Nausea And Vomiting    Patient Measurements: Height: '5\' 5"'$  (165.1 cm) Weight: 85 kg (187 lb 6.3 oz) IBW/kg (Calculated) : 57  Vital Signs: Temp: 98.4 F (36.9 C) (07/22 0916) Temp Source: Oral (07/22 0916) BP: 118/73 (07/22 0916) Pulse Rate: 76 (07/22 0916)  Labs: Recent Labs    01/08/21 0522 01/09/21 0306 01/09/21 0939 01/09/21 1341 01/09/21 1348 01/10/21 0544 01/10/21 1431  HGB 13.5 14.2  --  14.3 15.0  15.0 13.9  --   HCT 42.3 44.4  --  42.0 44.0  44.0 43.4  --   PLT 443* 483*  --   --   --  473*  --   LABPROT 24.6* 20.5*  --   --   --  18.1*  --   INR 2.2* 1.8*  --   --   --  1.5*  --   HEPARINUNFRC 0.63  --  0.38  --   --  0.23* 0.39  CREATININE 0.64 0.80  --   --   --  0.74  --      Estimated Creatinine Clearance: 69.4 mL/min (by C-G formula based on SCr of 0.74 mg/dL).   Assessment: 9 yof presenting to the ED with SOB. Warfarin consult for Hx of DVT in setting of mechanical valve. Bridging with heparin. Patient on warfarin takes '5mg'$  every day except Wednesday take 2.5 mg.   Heparin level therapeutic 0.39 on gtt at 1000 uts/hr. No issues with line or bleeding.  CBC stable  INR 1.5 < goal after being held for cath.  Ok to resume  And plan to DC home on enoxaparin bridge  Goal of Therapy:  Heparin level 0.3-0.7 INR goal 2.5-3.5 Monitor platelets by anticoagulation protocol: Yes   Plan:  Stop heparin drip at 8pm Start enoxaparin '1mg'$ /kg = '80mg'$  q12 - continue at DC unitl INR > 2.5 Warfarin 7.'5mg'$  x1 tonight then '5mg'$  daily until INR check as outpatient   Also for HF meds decrease furosemide '40mg'$  BID  As starting dapaglaflozin '10mg'$  daily  Enoxaparin and dapaglaflozin both $0  copay     Bonnita Nasuti Pharm.D. CPP, BCPS Clinical Pharmacist [phone] 01/10/2021 3:15 PM

## 2021-01-10 NOTE — Progress Notes (Signed)
Subjective:  Kim Anthony is a 70 y/o female with COPD, CHF, HTN, HLD, prior MV replacement, DVT/PE on chronic coumadin admitted for acute on chronic CHF.  Kim Anthony notes that she is feeling very well after her [facility] yesterday with Dr. Haroldine Laws. She has been able to ambulate comfortably on RA and feels that her symptoms on admission (increased cough, phlegm and dyspnea from her baseline) have resolved. She endorses no new concerns today and is eager to go home.  Objective:  Vital signs in last 24 hours: Vitals:   01/09/21 1718 01/09/21 2145 01/10/21 0557 01/10/21 0916  BP: 137/74 107/72 (!) 159/99 118/73  Pulse: 66 75 75 76  Resp: '18 16 16 20  '$ Temp: 97.8 F (36.6 C) 98 F (36.7 C) 98.7 F (37.1 C) 98.4 F (36.9 C)  TempSrc: Oral Oral Oral Oral  SpO2: 95% 93% 94% 95%  Weight:   85 kg   Height:       Weight change:   Intake/Output Summary (Last 24 hours) at 01/10/2021 0936 Last data filed at 01/10/2021 [id number] Gross per 24 hour  Intake 1015.7 ml  Output 2700 ml  Net -1684.3 ml   General: Pleasant, well-appearing woman laying comfortably in bed. No acute distress. Head: Normocephalic. Atraumatic. CV: RRR. No murmurs, rubs, or gallops. No LE edema Pulmonary: Lungs CTAB. Normal effort. No wheezing with minimal rales. Abdominal: Soft, nontender, nondistended. Normal bowel sounds. Extremities: Palpable radial and DP pulses. Normal ROM. Skin: Warm and dry. No obvious rash or lesions. Neuro: A&Ox3. Moves all extremities. Normal sensation. No focal deficit. Psych: Normal mood and affect    Assessment/Plan:  Principal Problem:   Acute on chronic combined systolic and diastolic CHF (congestive heart failure) (HCC) Active Problems:   HLD (hyperlipidemia)   Essential hypertension   History of mitral valve replacement with mechanical valve   COPD (chronic obstructive pulmonary disease) (HCC)   Dyspnea   Respiratory distress  Acute on chronic combine CHF 01/06/21 Echo's EF down  to 35-40% this admission, down from prior 01/20/19 EF of 55-60%. BNP at 209. TSH WNL. 01/05/21 CXR on admission with diffuse interstitial markings b/l. 01/09/21 CXR showed improved vascular congestion comparatively, after 5 days of diuresis. Appears clinically compensated. Tolerated R/LHC yesterday well, which did not reveal ischemic etiology. Will f/u as outpt w/ HF team. BUN normalized  to 19, with stable creatinine at 0.74. Neg negative I/O yesterday at 1857m. -Continue '80mg'$  Furosemide BID -'30mg'$  Imdur daily and 12.'5mg'$  Hydralazine TID -458m K replacement twice today; at 3.3 this morning -Monitoring strict I/O and daily weight -HTN therapy as below  COPD Given acute improvement of CXR 5 days after initial, do now feel that her initial acute respiratory failure with hypoxia was secondary to acute on chronic CHF, and did not reflect her first COPD exacerbation (impacts GOLD criteria). -Duonebs and Albuterol prn -Stiolto respimat 2.46m846mdaily  Isolated transaminitis AST increased to 179 from 125 yesterday; ALT increased to 121 from 70 yesterday. Chronic hepatitis labs pending. Without risk factors, is likely due to recent addition of Atorvastatin '80mg'$ .  -Will hold Atorvastatin and f/u hepatitis labs -Will give statin holiday until pt f/u w/ our clinic next week for consideration of pravastatin vs PCSK9-Inhibitor.  HTN 159/99 early this AM. Next BP read normalized at 118/73 -'25mg'$  Toprol XL -'20mg'$  Lisinopril -12.'5mg'$  Spironolactone  MV replacement and prior PE On chronic anticoagulation with warfarin. [facility] Now transitioning back to warfarin only. INR today at 1.5, well  beneath MV replacement goal of 2.5-3. Pt with needle phobia and disinclined to Lovenox bridge at home. -Pharmacy managing anticoagulation  Chronic back and r wrist pain -Continued home meds of Norco 5-325 and '900mg'$  Gabapentin TID   LOS: 4 days   Kim Anthony, Medical  Student 01/10/2021, 9:36 AM

## 2021-01-10 NOTE — TOC Transition Note (Signed)
Transition of Care [facility]) - CM/SW Discharge Note   Patient Details  Name: Kim Anthony [name]: [id number] Date of Birth: January 16, 1951  Transition [facility] CM/SW Contact:  Erenest Rasher, RN Phone Number: [phone] 01/10/2021, 12:57 PM   Clinical Narrative:    TOC CM met with pt at bedside to discuss dc plan and answer any questions. Reviewed appts, CHF brochure, Food Stamp application, [employer] bill, speaking to MD about cessation program smoking and Nicotine patch continue to wear mask, and walking in hall prior to dc. States she plans to quit smoking and wear patches. Pt has TOC CM/CSW contact information.    Final next level of care: Home/Self Care Barriers to Discharge: No Barriers Identified   Patient Goals and CMS Choice Patient states their goals for this hospitalization and ongoing recovery are:: recover and continue to manage CHF at home      Discharge Placement  Plan to dc home with daughter to assist as needed.   Discharge Plan and Services In-house Referral: Clinical Social Work Discharge Planning Services: CM Consult   Social Determinants of Health (SDOH) Interventions Food Insecurity Interventions: Assist with SNAP Application, Other (Comment) (Patient reports she gets food money from her health insurance [facility] Medicare and that she would like to apply again for Food Stamps) Financial Strain Interventions: Other (Comment) (Referral to outpatinet Advanced HF clinic for help with light bill and [place] referral for EBT) Housing Interventions: Intervention Not Indicated Transportation Interventions: Intervention Not Indicated   Readmission Risk Interventions Readmission Risk Prevention Plan 02/01/2019  Transportation Screening Complete  PCP or Specialist Appt within 3-5 Days Complete  HRI or [place] Complete  Social Work Consult for [place] Planning/Counseling Patient refused  Palliative Care Screening Not Applicable  Medication  Review Press photographer) Complete  Some recent data might be hidden

## 2021-01-10 NOTE — Discharge Summary (Addendum)
Name: Kim Anthony [name]: [id number] DOB: [birth year] 70 y.o. PCP: Kim Falcon, MD  Date of Admission: 01/05/2021  8:48 AM Date of Discharge: 01/11/2021 Attending Physician: Kim Falcon, MD  Discharge Diagnosis: 1.  Acute on chronic combined CHF exacerbation 2.  COPD exacerbation 3.  Transaminitis 4.  Hypertension 5.  MV replacement and prior PE 6.  Chronic right wrist and back pain  Discharge Medications: Allergies as of 01/11/2021       Reactions   Lisinopril Swelling   Acetaminophen-codeine Itching   Propoxyphene Itching   Darvocet   Tape Rash   Plastic   Tramadol Itching, Nausea And Vomiting        Medication List     TAKE these medications    albuterol 108 (90 Base) MCG/ACT inhaler Commonly known as: VENTOLIN HFA Inhale 1-2 puffs into the lungs every 6 (six) hours as needed for wheezing or shortness of breath.   aspirin 81 MG chewable tablet Chew 1 tablet (81 mg total) by mouth daily.   BreatheRite Coll Spacer Adult Misc 1 applicator by Does not apply route daily.   cetirizine 10 MG tablet Commonly known as: ZyrTEC Allergy Take 1 tablet (10 mg total) by mouth daily.   CITRACAL +D3 PO Take 1 tablet by mouth 2 (two) times a day.   dapagliflozin propanediol 10 MG Tabs tablet Commonly known as: FARXIGA Take 1 tablet (10 mg total) by mouth daily.   enoxaparin 80 MG/0.8ML injection Commonly known as: LOVENOX Inject 0.8 mLs (80 mg total) into the skin every 12 (twelve) hours for 5 days.   fluticasone 50 MCG/ACT nasal spray Commonly known as: Flonase Place 1 spray into both nostrils daily as needed for allergies.   furosemide 40 MG tablet Commonly known as: LASIX Take 1 tablet (40 mg total) by mouth 2 (two) times daily. What changed: when to take this   gabapentin 600 MG tablet Commonly known as: NEURONTIN Take 1.5 tablets (900 mg total) by mouth 3 (three) times daily. For nerve pain- increased to get her off lyrica   hydrALAZINE 25 MG  tablet Commonly known as: APRESOLINE Take 0.5 tablets (12.5 mg total) by mouth every 8 (eight) hours.   HYDROcodone-acetaminophen 5-325 MG tablet Commonly known as: Norco Take 1 tablet by mouth every 4 (four) hours as needed for moderate pain.   isosorbide mononitrate 30 MG 24 hr tablet Commonly known as: IMDUR Take 1 tablet (30 mg total) by mouth daily.   metoprolol succinate 25 MG 24 hr tablet Commonly known as: TOPROL-XL Take 1 tablet (25 mg total) by mouth daily.   potassium chloride SA 20 MEQ tablet Commonly known as: KLOR-CON Take 2 tablets (40 mEq total) by mouth 2 (two) times daily. What changed: when to take this   simvastatin 20 MG tablet Commonly known as: ZOCOR Take 1 tablet by mouth once daily   spironolactone 25 MG tablet Commonly known as: ALDACTONE Take 0.5 tablets (12.5 mg total) by mouth daily.   warfarin 5 MG tablet Commonly known as: COUMADIN Take as directed. If you are unsure how to take this medication, talk to your nurse or doctor. Original instructions: Take 1 tablet (5 mg total) by mouth one time only at 4 PM. What changed:  how much to take how to take this when to take this additional instructions       ASK your doctor about these medications    albuterol (2.5 MG/3ML) 0.083% nebulizer solution Commonly known as: PROVENTIL USE 1 VIAL  IN NEBULIZER EVERY 6 HOURS AS NEEDED FOR WHEEZING FOR SHORTNESS OF BREATH   Stiolto Respimat 2.5-2.5 MCG/ACT Aers Generic drug: Tiotropium Bromide-Olodaterol INHALE 2 PUFFS BY MOUTH ONCE DAILY        Disposition and follow-up:   Kim Anthony was discharged from [facility] in Stable condition. At the hospital follow up visit please address:  1.  Acute on chronic combined CHF exacerbation and/or COPD exacerbation: Patient responded well to diuresis in the hospital, and her last chest x-ray showed improved vascular congestion. She responded well to guideline-based therapy of 3 days of  azithromycin and prednisone, but it was difficult to ascribe therapeutic causation given concomitant CHF exacerbation. She was discharged on hydralazine, imdur, toprol, lasix, and spiro regimens as well as albuterol regimen. Of note, she had a history of angioedema in March 2022 for which she was hospitalized and told to discontinue her ACE inhibitor. Therefore, she was not a candidate for [facility], and lisinopril is now listed as an allergy in her chart.  2. Transaminitis: From admission to discharge, her AST increased 53->125->179, and ALT increased 23->70->121, all in the setting of normal bilirubin and alk phos. Pt was increased from her home regimen of 56m Simvastatin to 818mAtorvastatin while she was here, so this is a possible etiology of early DILI. Pt is without overt risk factors such as IV drug use, but she reports that she is not vaccinated against Hep B.  We obtained hepatitis B labs, which revealed + Hep B E antigen and low levels of Hep B surface antibody. However, her Hep B antigen was negative, so her Hep B E antigen results could be false positive. We did obtain Hep B DNA which was pending at discharge. She was discharged home on a lower intensity statin simvastatin. Could also consider RUQ in the future.  4. Hx of mechanical mitral valve replacement and prior PE: Pt is on chronic anticoagulation with warfarin. She was transitioned to heparin for her [facility], she was transitioned to SQ Lovenox 80 mg (started on the evening of 07/22). She was instructed to continue SQ Lovenox BID and daily warfarin until her next appointment in the warfarin clinic on 07/25.   Labs / imaging needed at time of follow-up: INR, consider obtaining RUQ  Pending labs/ test needing follow-up: Hep B DNA  Follow-up Appointments:  Follow-up Information     [facility] Follow up on 01/21/2021.   Specialty: Cardiology Why: Heart Failure Clinic at 2  pm [id number] information: [id number] in 3 day(s).   Why: Appt with Kim GrElie Conferor INR check at 10 am Contact information: [id number] by problem list:  1. Acute on chronic combined CHF exacerbation, Hypertension: Ms. KeHuxtableresented here with 3 days of dyspnea, a productive cough, and wheezing. In the ED, she was given 1 dose IV furosemide 40 mg with good urinary output. She had a BNP in the 200s and a CXR revealing pulmonary edema. Her prior echo was in 2020 which showed a normal EF 55-60%. She remained on IV diuretics and had a repeat echo showing EF 35-40%. Prior imaging also showed that patient had a prior  CABG, however patient was unaware of this, and her outside records otherwise did not reflect that she had a CABG. Cardiology was consulted and recommended a R/LHC. She underwent this procedure on 07/21 which revealed 1vCAD mRCA chronically occluded and well compensated hemodynamics. At discharge, she was well compensated after multiple days of diuretics. CXR was repeated and showed improving vascular congestion. She was discharged on hydralazine, imdur, toprol, lasix, and spiro regimens. Of note, she had a history of angioedema in March 2022 for which she was hospitalized and told to discontinue her ACE inhibitor. Therefore, she was not a candidate for [facility].  2. COPD exacerbation: Patient presented with 2/3 cardinal symptoms to classify as a moderate COPD exacerbation: dyspnea and increased sputum production. Initial CXR showed diffusely interstitial marking bilaterally. At admission, she was started on Duonebs, oral prednisone (total 3 days), and azithromycin (total 3 days) regimens. No history of COPD exacerbations in the past. She responded well to  guideline-based therapy of 3 days of azithromycin and prednisone, but it was difficult to ascribe therapeutic causation given concomitant CHF exacerbation. CXR did show improving vascular congestion, suggesting that her clinical picture was more consistent with a CHF exacerbation. She was discharged on her home regimen.  3. Transaminitis: From admission to discharge, her AST increased 53->125->179, and ALT increased 23->70->121, all in the setting of normal bilirubin and alk phos. Pt was increased from her home regimen of 38m Simvastatin to 876mAtorvastatin while she was here, so this is a possible etiology of early DILI. Pt is without overt risk factors such as IV drug use, but she reports that she is not vaccinated against Hep B. We obtained hepatitis B labs, which revealed + Hep B E antigen and low levels of Hep B surface antibody. We subsequently obtained Hep B DNA which was pending at discharge.   4. Hx of mechanical mitral valve replacement and prior PE: Pt is on chronic anticoagulation with warfarin. She was transitioned to heparin for her [facility], she was transitioned to SQ Lovenox 80 mg (started on the evening of 07/22). She was instructed to continue SQ Lovenox BID and daily warfarin until her next appointment in the warfarin clinic on 07/25. Otherwise, she used SCDs during her hospitalization and did not have any acute events.   Patient's home medications were continued for her other chronic conditions.   Discharge Exam:   BP 133/76 (BP Location: Left Leg)   Pulse 72   Temp 97.8 F (36.6 C) (Oral)   Resp 16   Ht '5\' 5"'  (1.651 m)   Wt 80.6 kg   SpO2 94%   BMI 29.55 kg/m  Discharge exam:  General: Pleasant, well-appearing woman laying comfortably in bed. No acute distress. Head: Normocephalic. Atraumatic. CV: RRR. No murmurs, rubs, or gallops. No LE edema Pulmonary: CTAB. Normal effort. No wheezing with minimal rales. Abdominal: Soft, nontender, nondistended.  Normal bowel sounds. Extremities: Palpable radial and DP pulses. Normal ROM. Skin: Warm and dry. No obvious rash or lesions. Neuro: A&Ox3. Moves all extremities. Normal sensation. No focal deficit. Psych: Normal mood and affect    Pertinent Labs, Studies, and Procedures:  DG Chest Port 1 View  Result Date: 01/05/2021 CLINICAL DATA:  Shortness of breath EXAM: PORTABLE CHEST 1 VIEW COMPARISON:  02/15/2020 FINDINGS: Prior median sternotomy and cardiac valve replacement. Stable cardiomegaly. Chronic left apical scarring. Diffusely increased interstitial markings throughout both lung fields. No pleural effusion or pneumothorax. IMPRESSION: Diffusely increased interstitial markings throughout both lung fields, may  represent pulmonary edema versus atypical/viral infection. Electronically Signed   By: Davina Poke D.O.   On: 01/05/2021 09:41   ECHOCARDIOGRAM COMPLETE  Result Date: 01/06/2021    ECHOCARDIOGRAM REPORT   Patient Name:   Kim Anthony Date of Exam: 01/06/2021 Medical Rec #:  [id number]   Height:       65.0 in Accession #:    [id number]  Weight:       177.0 lb Date of Birth:  21-Feb-1951    BSA:          1.878 m Patient Age:    76 years    BP:           111/68 mmHg Patient Gender: F           HR:           53 bpm. Exam Location:  Inpatient Procedure: 2D Echo, Color Doppler and Cardiac Doppler Indications:    Dyspnea  History:        Patient has prior history of Echocardiogram examinations, most                 recent 01/20/2019. CHF, CAD, COPD, Signs/Symptoms:Murmur; Risk                 Factors:Dyslipidemia and Hypertension. Mechanical MVR.                  Mitral Valve: mechanical valve valve is present in the mitral                 position.  Sonographer:    Luisa Hart RDCS Referring Phys: [place]  1. Left ventricular ejection fraction, by estimation, is 35 to 40%. The left ventricle has moderately decreased function. The left ventricle has no regional wall motion  abnormalities. Left ventricular diastolic parameters are indeterminate.  2. Right ventricular systolic function is normal. The right ventricular size is mildly enlarged. There is mildly elevated pulmonary artery systolic pressure. The estimated right ventricular systolic pressure is 82.9 mmHg.  3. Left atrial size was moderately dilated.  4. Right atrial size was mildly dilated.  5. The mitral valve has been repaired/replaced. No evidence of mitral valve regurgitation. No evidence of mitral stenosis. The mean mitral valve gradient is 4.0 mmHg. There is a mechanical valve present in the mitral position.  6. Tricuspid valve regurgitation is moderate.  7. The aortic valve is normal in structure. Aortic valve regurgitation is not visualized. No aortic stenosis is present.  8. Aortic dilatation noted. There is mild dilatation of the aortic root, measuring 41 mm.  9. The inferior vena cava is normal in size with greater than 50% respiratory variability, suggesting right atrial pressure of 3 mmHg. FINDINGS  Left Ventricle: Left ventricular ejection fraction, by estimation, is 35 to 40%. The left ventricle has moderately decreased function. The left ventricle has no regional wall motion abnormalities. The left ventricular internal cavity size was normal in size. There is no left ventricular hypertrophy. Left ventricular diastolic parameters are indeterminate. Right Ventricle: The right ventricular size is mildly enlarged. No increase in right ventricular wall thickness. Right ventricular systolic function is normal. There is mildly elevated pulmonary artery systolic pressure. The tricuspid regurgitant velocity is 2.89 m/s, and with an assumed right atrial pressure of 3 mmHg, the estimated right ventricular systolic pressure is 56.2 mmHg. Left Atrium: Left atrial size was moderately dilated. Right Atrium: Right atrial size was mildly dilated. Pericardium: There is no evidence of pericardial effusion.  Mitral Valve: The mitral  valve has been repaired/replaced. No evidence of mitral valve regurgitation. There is a mechanical valve present in the mitral position. No evidence of mitral valve stenosis. MV peak gradient, 7.8 mmHg. The mean mitral valve gradient is 4.0 mmHg. Tricuspid Valve: The tricuspid valve is normal in structure. Tricuspid valve regurgitation is moderate . No evidence of tricuspid stenosis. Aortic Valve: The aortic valve is normal in structure. Aortic valve regurgitation is not visualized. No aortic stenosis is present. Aortic valve mean gradient measures 3.0 mmHg. Aortic valve peak gradient measures 5.4 mmHg. Aortic valve area, by VTI measures 3.94 cm. Pulmonic Valve: The pulmonic valve was normal in structure. Pulmonic valve regurgitation is trivial. No evidence of pulmonic stenosis. Aorta: Aortic dilatation noted. There is mild dilatation of the aortic root, measuring 41 mm. Venous: The inferior vena cava is normal in size with greater than 50% respiratory variability, suggesting right atrial pressure of 3 mmHg. IAS/Shunts: No atrial level shunt detected by color flow Doppler.  LEFT VENTRICLE PLAX 2D LVIDd:         5.40 cm     Diastology LVIDs:         2.90 cm     LV e' medial:    7.18 cm/s LV PW:         0.70 cm     LV E/e' medial:  21.9 LV IVS:        1.00 cm     LV e' lateral:   7.51 cm/s LVOT diam:     2.50 cm     LV E/e' lateral: 20.9 LV SV:         91 LV SV Index:   49 LVOT Area:     4.91 cm  LV Volumes (MOD) LV vol d, MOD A2C: 79.2 ml LV vol d, MOD A4C: 70.5 ml LV vol s, MOD A2C: 62.9 ml LV vol s, MOD A4C: 51.5 ml LV SV MOD A2C:     16.3 ml LV SV MOD A4C:     70.5 ml LV SV MOD BP:      22.0 ml RIGHT VENTRICLE RV Basal diam:  5.40 cm RV Mid diam:    3.90 cm RV S prime:     10.20 cm/s TAPSE (M-mode): 1.4 cm LEFT ATRIUM             Index       RIGHT ATRIUM           Index LA Vol (A2C):   93.6 ml 49.84 ml/m RA Area:     22.50 cm LA Vol (A4C):   62.1 ml 33.06 ml/m RA Volume:   63.60 ml  33.86 ml/m LA Biplane  Vol: 76.3 ml 40.62 ml/m  AORTIC VALVE                   PULMONIC VALVE AV Area (Vmax):    4.06 cm    PV Vmax:       0.67 m/s AV Area (Vmean):   3.93 cm    PV Vmean:      47.300 cm/s AV Area (VTI):     3.94 cm    PV VTI:        0.148 m AV Vmax:           116.00 cm/s PV Peak grad:  1.8 mmHg AV Vmean:          76.000 cm/s PV Mean grad:  1.0 mmHg AV VTI:  0.232 m AV Peak Grad:      5.4 mmHg AV Mean Grad:      3.0 mmHg LVOT Vmax:         95.90 cm/s LVOT Vmean:        60.900 cm/s LVOT VTI:          0.186 m LVOT/AV VTI ratio: 0.80  AORTA Ao Root diam: 4.10 cm Ao Asc diam:  3.50 cm MITRAL VALVE                TRICUSPID VALVE MV Area (PHT): 3.39 cm     TR Peak grad:   33.4 mmHg MV Area VTI:   2.52 cm     TR Vmax:        289.00 cm/s MV Peak grad:  7.8 mmHg MV Mean grad:  4.0 mmHg     SHUNTS MV Vmax:       1.40 m/s     Systemic VTI:  0.19 m MV Vmean:      91.0 cm/s    Systemic Diam: 2.50 cm MV Decel Time: 224 msec MV E velocity: 157.00 cm/s MV A velocity: 85.20 cm/s MV E/A ratio:  1.84 Candee Furbish MD Electronically signed by Candee Furbish MD Signature Date/Time: 01/06/2021/12:35:37 PM    Final     CXR, 01/09/21:  IMPRESSION: Improving vascular congestion when compare with the prior study.  R/LHC, 07/21:  Assessment: 1v CAD with chronic occlusion of mRCA Relatively well-compensated filling pressures with mild pulmonary HTN Normal cardiac output Difficult coronary ostia to engage due to tortuous subclavian artery. JL 3.5 use for left system LCB used for the RCA which had a high anterior takeoff  Plan/Discussion:  Medical therapy.  CMP Latest Ref Rng & Units 01/11/2021 01/10/2021 01/09/2021  Glucose 70 - 99 mg/dL 116(H) 127(H) -  BUN 8 - 23 mg/dL 20 19 -  Creatinine 0.44 - 1.00 mg/dL 0.74 0.74 -  Sodium 135 - 145 mmol/L 135 139 142  Potassium 3.5 - 5.1 mmol/L 4.0 3.3(L) 3.8  Chloride 98 - 111 mmol/L 102 106 -  CO2 22 - 32 mmol/L 21(L) 22 -  Calcium 8.9 - 10.3 mg/dL 9.6 9.4 -  Total Protein 6.5 -  8.1 g/dL 7.7 7.1 -  Total Bilirubin 0.3 - 1.2 mg/dL 0.8 0.8 -  Alkaline Phos 38 - 126 U/L 114 103 -  AST 15 - 41 U/L 125(H) 179(H) -  ALT 0 - 44 U/L 114(H) 121(H) -     CBC Latest Ref Rng & Units 01/11/2021 01/10/2021 01/09/2021  WBC 4.0 - 10.5 K/uL 10.8(H) 10.3 -  Hemoglobin 12.0 - 15.0 g/dL 14.7 13.9 15.0  Hematocrit 36.0 - 46.0 % 45.6 43.4 44.0  Platelets 150 - 400 K/uL 448(H) 473(H) -    Hep B antigen positive Hep B surface antigen negative Hep B surface antibody <3.1 (L) Hep B core antibody, IgM negative   Discharge Instructions: Discharge Instructions     Call MD for:  difficulty breathing, headache or visual disturbances   Complete by: As directed        Signed: Orvis Brill, MD 01/11/2021, 9:07 AM   Pager: [phone]

## 2021-01-10 NOTE — Plan of Care (Signed)
  Problem: Education: Goal: Knowledge of General Education information will improve Description: Including pain rating scale, medication(s)/side effects and non-pharmacologic comfort measures Outcome: Progressing   Problem: Clinical Measurements: Goal: Respiratory complications will improve Outcome: Progressing   Problem: Activity: Goal: Risk for activity intolerance will decrease Outcome: Progressing   

## 2021-01-10 NOTE — Progress Notes (Addendum)
Advanced Heart Failure Rounding Note  PCP-Cardiologist: None   Subjective:    Cath 07/21 with 1v CAD with occluded mRCA, RA 9, PCW 14, CI 2.9.  Feeling well today. Denies chest pain. Dyspnea resolved. Eager to go home.  Objective:   Weight Range: 85 kg Body mass index is 31.18 kg/m.   Vital Signs:   Temp:  [97.8 F (36.6 C)-98.7 F (37.1 C)] 98.4 F (36.9 C) (07/22 0916) Pulse Rate:  [56-77] 76 (07/22 0916) Resp:  [10-23] 20 (07/22 0916) BP: (83-173)/(59-111) 118/73 (07/22 0916) SpO2:  [90 %-100 %] 95 % (07/22 0916) Weight:  [85 kg] 85 kg (07/22 0557) Last BM Date: 01/08/21  Weight change: Filed Weights   01/07/21 0635 01/09/21 0913 01/10/21 0557  Weight: 81.6 kg 85 kg 85 kg    Intake/Output:   Intake/Output Summary (Last 24 hours) at 01/10/2021 0949 Last data filed at 01/10/2021 0834 Gross per 24 hour  Intake 1015.7 ml  Output 2700 ml  Net -1684.3 ml      Physical Exam    General:  Well appearing. No resp difficulty HEENT: normal Neck: supple. JVP 8 Carotids 2+ bilat; no bruits. No lymphadenopathy or thryomegaly appreciated. Cor: PMI nondisplaced. Regular rate & rhythm. Mechanical s1 Lungs: CTA bilaterally Abdomen: obese, soft, nontender, nondistended. No hepatosplenomegaly. No bruits or masses. Good bowel sounds. Extremities: no cyanosis, clubbing, rash, edema Neuro: alert & orientedx3, cranial nerves grossly intact. moves all 4 extremities w/o difficulty. Affect pleasant   Telemetry  NSR 60-70s Personally reviewed   Labs    CBC Recent Labs    01/09/21 0306 01/09/21 1341 01/09/21 1348 01/10/21 0544  WBC 13.8*  --   --  10.3  NEUTROABS  --   --   --  5.7  HGB 14.2   < > 15.0  15.0 13.9  HCT 44.4   < > 44.0  44.0 43.4  MCV 102.8*  --   --  102.4*  PLT 483*  --   --  473*   < > = values in this interval not displayed.   Basic Metabolic Panel Recent Labs    01/09/21 0306 01/09/21 1341 01/09/21 1348 01/10/21 0544  NA 136   < >  142  142 139  K 3.8   < > 3.8  3.8 3.3*  CL 102  --   --  106  CO2 22  --   --  22  GLUCOSE 105*  --   --  127*  BUN 29*  --   --  19  CREATININE 0.80  --   --  0.74  CALCIUM 9.4  --   --  9.4   < > = values in this interval not displayed.   Liver Function Tests Recent Labs    01/09/21 0306 01/10/21 0544  AST 125* 179*  ALT 70* 121*  ALKPHOS 116 103  BILITOT 0.9 0.8  PROT 7.3 7.1  ALBUMIN 3.5 3.3*   No results for input(s): LIPASE, AMYLASE in the last 72 hours. Cardiac Enzymes No results for input(s): CKTOTAL, CKMB, CKMBINDEX, TROPONINI in the last 72 hours.  BNP: BNP (last 3 results) Recent Labs    02/09/20 1145 08/26/20 1442 01/05/21 0857  BNP 131.5* 277.3* 209.8*    ProBNP (last 3 results) No results for input(s): PROBNP in the last 8760 hours.   D-Dimer No results for input(s): DDIMER in the last 72 hours. Hemoglobin A1C No results for input(s): HGBA1C in the last 72 hours.  Fasting Lipid Panel No results for input(s): CHOL, HDL, LDLCALC, TRIG, CHOLHDL, LDLDIRECT in the last 72 hours. Thyroid Function Tests No results for input(s): TSH, T4TOTAL, T3FREE, THYROIDAB in the last 72 hours.  Invalid input(s): FREET3  Other results:   Imaging    CARDIAC CATHETERIZATION  Result Date: 01/09/2021 Formatting of this result is different from the original.   Mid RCA lesion is 100% stenosed.   Ost Cx to Prox Cx lesion is 30% stenosed.   Dist Cx lesion is 30% stenosed.   Dist LAD lesion is 40% stenosed. Findings: Ao = 157/85 (114) LV = 150/20 RA = 9 RV = 51/9 PA = 48/19 (32) PCW = 14 Fick cardiac output/index = 5.4/2.9 PVR = 3.3 WU FA sat = 95% PA sat = 73%, 69% Assessment: 1v CAD with chronic occlusion of mRCA Relatively well-compensated filling pressures with mild pulmonary HTN Normal cardiac output Difficult coronary ostia to engage due to tortuous subclavian artery. JL 3.5 use for left system LCB used for the RCA which had a high anterior takeoff Plan/Discussion:  Medical therapy. Glori Bickers, MD 2:46 PM  DG CHEST PORT 1 VIEW  Result Date: 01/09/2021 CLINICAL DATA:  COPD exacerbation EXAM: PORTABLE CHEST 1 VIEW COMPARISON:  01/05/2021 FINDINGS: Cardiac shadow is enlarged. Postsurgical changes are again seen. Mild vascular congestion is noted although improved when compared with the prior exam. No focal confluent infiltrate is seen. IMPRESSION: Improving vascular congestion when compare with the prior study. Electronically Signed   By: Inez Catalina M.D.   On: 01/09/2021 13:29     Medications:     Scheduled Medications:  calcium-vitamin D  1 tablet Oral Q breakfast   furosemide  80 mg Oral BID   gabapentin  900 mg Oral TID   hydrALAZINE  12.5 mg Oral Q8H   isosorbide mononitrate  30 mg Oral Daily   loratadine  10 mg Oral Daily   metoprolol succinate  25 mg Oral Daily   nicotine  7 mg Transdermal Daily   potassium chloride  40 mEq Oral BID   sodium chloride flush  3 mL Intravenous Q12H   spironolactone  12.5 mg Oral Daily   Warfarin - Pharmacist Dosing Inpatient   Does not apply q1600    Infusions:  sodium chloride Stopped (01/05/21 2309)   sodium chloride     heparin 1,000 Units/hr (01/10/21 0636)    PRN Medications: sodium chloride, sodium chloride, acetaminophen, albuterol, HYDROcodone-acetaminophen, ipratropium-albuterol, ondansetron (ZOFRAN) IV, ramelteon, sodium chloride flush   Assessment/Plan  Acute on chronic combined CHF: --NYHA class IV on admit. Chest x-ray with evidence of CHF -EF 35-40% this admit (Dr. Jerilee Field felt EF 45%), previously 55-60% in 2020. EF 45-50% in 2017 in setting of PE. Has mild to moderate RV dysfunction. MV prosthesis okay. -Cath 7/21 with 1vCAD mRCA chronically occluded and well compensated hemodynamics  -Appears compensated on po lasix at 80 mg BID. Replace K. Mag 1. 7, replacing. -Continue toprol xl 25 mg daily -Continue hydralazine 12.5 mg TID/imdur 30 mg daily -Continue spiro 12.5 mg  daily -Add jardiance 10 mg daily --Had been on ACE-I previously. Stopped after seen in ED in April 2022 with facial swelling and possible angioedema. ARNi contraindicated.  2. Acute respiratory failure with hypoxia: -Probably secondary to combination of acute on chronic CHF and COPD exacerbation -Intermittently required 2L 02 [place]-Sats stable.  -COPD exacerbation treated by primary team  3. Hx of mechanical mitral valve replacement: -Mechanical MVR in [place] in 2001 -mean gradient  of 4 mmHg with no significant MR on echo this admit -On heparin and Warfarin per pharmacy. INR 1.5. Needs to be bridged until INR 2.5. -Continue aspirin.  4. Tricuspid valve regurgitation: -Moderate in severity on echo   5. Hx DVT/PE: -SCDs.  -Anticoagulated   6. HTN: Stable.    7. Smoker: -Smoking cessation discussed -wearing patch   8. CAD  -1 V CAD with occluded mid RCA -Denies angina -Switched simvastatin to 40 mg atorvastatin. Now holding statin per internal medicine d/t elevated LFTs. Workup pending. -On asa      SDOH: -No transportation needs. Lives independently. -Has Medicare.  No difficulty obtaining meds. Will review HF meds with PharmD.   Okay for discharge today from HF perspective.  F/u in HF clinic arranged.  Medications at D/C: Metoprolol xl 25 mg daily Hydralazine 12.5 mg TID Imdur 30 mg daily Jardiance 10 mg daily Spiro 12.5 mg daily Furosemide 80 mg BID Potassium 40 mEq BID Aspirin 81 mg daily No ACE-I or ARNI at discharge d/t history of angioedema.  Anticoagulated with Warfarin. Will need to be bridged with lovenox until INR 2.5.   Plan discussed with Dr. Jeffie Pollock. Patient felt to be stable for D/C today.   Length of Stay: 4  FINCH, LINDSAY N, PA-C  01/10/2021, 9:49 AM  Advanced Heart Failure Team Pager [phone] (M-F; 7a - 5p)  Please contact [facility] for night-coverage after hours (5p -7a ) and weekends on [email or web address]  Patient seen and examined  with the above-signed Advanced Practice Provider and/or Housestaff. I personally reviewed laboratory data, imaging studies and relevant notes. I independently examined the patient and formulated the important aspects of the plan. I have edited the note to reflect any of my changes or salient points. I have personally discussed the plan with the patient and/or family.  Feels good. Denies CP or SOB. Eager to go home. On heparin. No bleeding INR 1.5  General:  Well appearing. No resp difficulty HEENT: normal Neck: supple. JVP 6 Carotids 2+ bilat; no bruits. No lymphadenopathy or thryomegaly appreciated. Cor: PMI nondisplaced. Regular rate & rhythm. Mechanical s1 Lungs: clear Abdomen: obese soft, nontender, nondistended. No hepatosplenomegaly. No bruits or masses. Good bowel sounds. Extremities: no cyanosis, clubbing, rash, edema Neuro: alert & orientedx3, cranial nerves grossly intact. moves all 4 extremities w/o difficulty. Affect pleasant  Volume status looks good. I reviewed echo again and EF 40-45%. Agree with meds as above OK for d/c today from HF perspective. Will need lovenox bridge for mechanical MVR. D/w PharmD.   Supp K and Mg  HF team will sign off. Please call me with questions.   Glori Bickers, MD  2:21 PM

## 2021-01-10 NOTE — TOC Progression Note (Signed)
Transition of Care (TOC) - Progression Note  Heart Failure   Patient Details  Name: Kim Anthony [name]: [id number] Date of Birth: [birth year]  Transition of Care [facility]) CM/SW [place], Day Phone Number: 01/10/2021, 12:08 PM  Clinical Narrative:    CSW spoke with the patient at bedside and discussed further about her [employer]. CSW informed Kim Anthony that the HF team will be able to help her with her bill. Kim Anthony called her daughter Kim Anthony [phone] who provided the CSW with their [employer] account number to pay the bill. CSW was able to pay the [employer] using HF funds. CSW also again spoke with Kim Anthony about Food Stamps and obtained her signature for the Food Stamp referral for the [facility] to reach out to her. CSW faxed over the Food Stamp application and informed the patient that the [facility] will be in touch. CSW provided Kim Anthony with an appointment card for the [facility] outpatient clinic and encouraged her to follow up and to attend the appointment and bring her medications and if anything changes to please reach out so that CSW/HV clinic team can provide support.   CSW will continue to follow throughout discharge   Expected Discharge Plan: Home/Self Care Barriers to Discharge: Continued Medical Work up  Expected Discharge Plan and Services Expected Discharge Plan: Home/Self Care In-house Referral: Clinical Social Work Discharge Planning Services: CM Consult   Living arrangements for the past 2 months: Single Family Home                                       Social Determinants of Health (SDOH) Interventions Food Insecurity Interventions: Assist with [employer] Application, Other (Comment) (Patient reports she gets food money from her health insurance [facility] Medicare and that she would like to apply again for Food Stamps) Financial Strain Interventions: Other (Comment) (Referral to outpatinet Advanced HF clinic for help with light bill and  [place] referral for EBT) Housing Interventions: Intervention Not Indicated Transportation Interventions: Intervention Not Indicated  Readmission Risk Interventions Readmission Risk Prevention Plan 02/01/2019  Transportation Screening Complete  PCP or Specialist Appt within 3-5 Days Complete  HRI or [place] Complete  Social Work Consult for [place] Planning/Counseling Patient refused  Palliative Care Screening Not Applicable  Medication Review Press photographer) Complete  Some recent data might be hidden   [place], MSW, [place] Social Worker

## 2021-01-11 DIAGNOSIS — I5043 Acute on chronic combined systolic (congestive) and diastolic (congestive) heart failure: Secondary | ICD-10-CM | POA: Diagnosis not present

## 2021-01-11 LAB — CBC
HCT: 45.6 % (ref 36.0–46.0)
Hemoglobin: 14.7 g/dL (ref 12.0–15.0)
MCH: 33.1 pg (ref 26.0–34.0)
MCHC: 32.2 g/dL (ref 30.0–36.0)
MCV: 102.7 fL — ABNORMAL HIGH (ref 80.0–100.0)
Platelets: 448 10*3/uL — ABNORMAL HIGH (ref 150–400)
RBC: 4.44 MIL/uL (ref 3.87–5.11)
RDW: 12.1 % (ref 11.5–15.5)
WBC: 10.8 10*3/uL — ABNORMAL HIGH (ref 4.0–10.5)
nRBC: 0 % (ref 0.0–0.2)

## 2021-01-11 LAB — COMPREHENSIVE METABOLIC PANEL
ALT: 114 U/L — ABNORMAL HIGH (ref 0–44)
AST: 125 U/L — ABNORMAL HIGH (ref 15–41)
Albumin: 3.7 g/dL (ref 3.5–5.0)
Alkaline Phosphatase: 114 U/L (ref 38–126)
Anion gap: 12 (ref 5–15)
BUN: 20 mg/dL (ref 8–23)
CO2: 21 mmol/L — ABNORMAL LOW (ref 22–32)
Calcium: 9.6 mg/dL (ref 8.9–10.3)
Chloride: 102 mmol/L (ref 98–111)
Creatinine, Ser: 0.74 mg/dL (ref 0.44–1.00)
GFR, Estimated: >60 mL/min (ref >60)
Glucose, Bld: 116 mg/dL — ABNORMAL HIGH (ref 70–99)
Potassium: 4 mmol/L (ref 3.5–5.1)
Sodium: 135 mmol/L (ref 135–145)
Total Bilirubin: 0.8 mg/dL (ref 0.3–1.2)
Total Protein: 7.7 g/dL (ref 6.5–8.1)

## 2021-01-11 LAB — PROTIME-INR
INR: 1.7 — ABNORMAL HIGH (ref 0.8–1.2)
Prothrombin Time: 19.6 seconds — ABNORMAL HIGH (ref 11.4–15.2)

## 2021-01-11 LAB — HEPARIN LEVEL (UNFRACTIONATED): Heparin Unfractionated: 0.66 IU/mL (ref 0.30–0.70)

## 2021-01-11 LAB — HEPATITIS B SURFACE ANTIBODY, QUANTITATIVE: Hep B S AB Quant (Post): <3.1 m[IU]/mL — ABNORMAL LOW (ref >9.9)

## 2021-01-11 LAB — HEPATITIS B E ANTIGEN: Hep B E Ag: POSITIVE — AB

## 2021-01-11 MED ORDER — FUROSEMIDE 40 MG PO TABS: 40.0000 mg | ORAL_TABLET | Freq: Two times a day (BID) | ORAL | 0 refills | Status: DC

## 2021-01-11 MED ORDER — DAPAGLIFLOZIN PROPANEDIOL 10 MG PO TABS
10.0000 mg | ORAL_TABLET | Freq: Every day | ORAL | 0 refills | Status: DC
Start: 2021-01-11 — End: 2021-02-04

## 2021-01-11 MED ORDER — ENOXAPARIN SODIUM 80 MG/0.8ML IJ SOSY: 80.0000 mg | PREFILLED_SYRINGE | Freq: Two times a day (BID) | INTRAMUSCULAR | 0 refills | Status: DC

## 2021-01-11 MED ORDER — SPIRONOLACTONE 25 MG PO TABS: 12.5000 mg | ORAL_TABLET | Freq: Every day | ORAL | 0 refills | Status: DC

## 2021-01-11 MED ORDER — METOPROLOL SUCCINATE ER 25 MG PO TB24: 25.0000 mg | ORAL_TABLET | Freq: Every day | ORAL | 0 refills | Status: DC

## 2021-01-11 MED ORDER — NICOTINE 7 MG/24HR TD PT24: 7.0000 mg | MEDICATED_PATCH | Freq: Every day | TRANSDERMAL | 0 refills | Status: DC

## 2021-01-11 MED ORDER — ISOSORBIDE MONONITRATE ER 30 MG PO TB24: 30.0000 mg | ORAL_TABLET | Freq: Every day | ORAL | 0 refills | Status: DC

## 2021-01-11 MED ORDER — WARFARIN SODIUM 5 MG PO TABS: 5.0000 mg | ORAL_TABLET | Freq: Once | ORAL | 0 refills | Status: DC

## 2021-01-11 MED ORDER — ASPIRIN 81 MG PO CHEW: 81.0000 mg | CHEWABLE_TABLET | Freq: Every day | ORAL | 0 refills | Status: DC

## 2021-01-11 MED ORDER — POTASSIUM CHLORIDE CRYS ER 20 MEQ PO TBCR
40.0000 meq | EXTENDED_RELEASE_TABLET | Freq: Two times a day (BID) | ORAL | 0 refills | Status: DC
Start: 2021-01-11 — End: 2021-01-27

## 2021-01-11 MED ORDER — ALBUTEROL SULFATE (2.5 MG/3ML) 0.083% IN NEBU: INHALATION_SOLUTION | RESPIRATORY_TRACT | 2 refills | Status: DC

## 2021-01-11 MED ORDER — HYDRALAZINE HCL 25 MG PO TABS
12.5000 mg | ORAL_TABLET | Freq: Three times a day (TID) | ORAL | 0 refills | Status: DC
Start: 2021-01-11 — End: 2021-02-04

## 2021-01-11 MED ORDER — WARFARIN SODIUM 5 MG PO TABS: 5.0000 mg | ORAL_TABLET | Freq: Once | ORAL | Status: DC

## 2021-01-11 NOTE — Discharge Instructions (Addendum)
You were admitted to the hospital for an acute worsening of your heart failure. You responded well to the water pills that we gave you, and this improvement was also seen on your last chest x-ray. You will receive one Lovenox injection today, and you will need another injection tonight. Tomorrow (Sunday), you will need an injection in the morning and an injection in the evening. On Monday, you will have an appointment with the Coumadin clinic at 10 AM. Pharmacy also recommends that you take Warfarin '5mg'$  tonight, then '5mg'$  of Warfarin on Sunday, until your appointment on Monday. I have ordered you more albuterol. You may restart your simvastatin, because this is a lower intensity statin than atorvastatin (the statin that you were on here).

## 2021-01-11 NOTE — Progress Notes (Signed)
[place] for enoxaparin and warfarin Indication:  Hx of DVT  with mechanical valve  Allergies  Allergen Reactions   Lisinopril Swelling   Acetaminophen-Codeine Itching   Propoxyphene Itching    Darvocet   Tape Rash    Plastic   Tramadol Itching and Nausea And Vomiting    Patient Measurements: Height: '5\' 5"'$  (165.1 cm) Weight: 80.6 kg (177 lb 9.6 oz) IBW/kg (Calculated) : 57  Vital Signs: Temp: 97.8 F (36.6 C) (07/23 0523) Temp Source: Oral (07/23 0523) BP: 133/76 (07/23 0523) Pulse Rate: 72 (07/23 0523)  Labs: Recent Labs    01/09/21 0306 01/09/21 0939 01/09/21 1348 01/10/21 0544 01/10/21 1431 01/11/21 0340  HGB 14.2   < > 15.0  15.0 13.9  --  14.7  HCT 44.4   < > 44.0  44.0 43.4  --  45.6  PLT 483*  --   --  473*  --  448*  LABPROT 20.5*  --   --  18.1*  --  19.6*  INR 1.8*  --   --  1.5*  --  1.7*  HEPARINUNFRC  --    < >  --  0.23* 0.39 0.66  CREATININE 0.80  --   --  0.74  --  0.74   < > = values in this interval not displayed.     Estimated Creatinine Clearance: 67.6 mL/min (by C-G formula based on SCr of 0.74 mg/dL).   Assessment: 67 yof presenting to the ED with SOB. Warfarin consult for Hx of DVT in setting of mechanical valve. Bridged with heparin for cath. Patient on warfarin takes '5mg'$  every day except Wednesday take 2.5 mg.   Warfarin was resumed on 7/21 after cath.  Patient is currently being bridged with enoxaparin and should continue at discharge until INR > 2.5  Other medication changes: For HF meds decrease furosemide '40mg'$  BID  As starting dapaglaflozin '10mg'$  daily  Enoxaparin and dapaglaflozin both $0 copay   Goal of Therapy:  INR goal 2.5-3.5 Monitor platelets by anticoagulation protocol: Yes   Plan:  Continue enoxaparin '1mg'$ /kg = '80mg'$  q12 - continue at DC unitl INR > 2.5 Warfarin '5mg'$  PO x1 tonight then '5mg'$  daily until INR check as outpatient   Dimple Nanas, PharmD 01/11/2021 8:27  AM

## 2021-01-11 NOTE — Progress Notes (Signed)
DISCHARGE NOTE HOME Rosey Feria to be discharged Home per MD order. Discussed prescriptions and follow up appointments with the patient. Prescriptions given to patient; medication list explained in detail. Patient verbalized understanding.  Skin clean, dry and intact without evidence of skin break down, no evidence of skin tears noted. IV catheter discontinued intact. Site without signs and symptoms of complications. Dressing and pressure applied. Pt denies pain at the site currently. No complaints noted.  Patient free of lines, drains, and wounds.   An After Visit Summary (AVS) was printed and given to the patient. Patient escorted via wheelchair, and discharged home via private auto.  Arlyss Repress, RN

## 2021-01-11 NOTE — Plan of Care (Signed)
  Problem: Education: Goal: Knowledge of General Education information will improve Description: Including pain rating scale, medication(s)/side effects and non-pharmacologic comfort measures Outcome: Progressing   Problem: Clinical Measurements: Goal: Ability to maintain clinical measurements within normal limits will improve Outcome: Progressing Goal: Respiratory complications will improve Outcome: Progressing Goal: Cardiovascular complication will be avoided Outcome: Progressing   Problem: Activity: Goal: Risk for activity intolerance will decrease Outcome: Progressing   

## 2021-01-13 ENCOUNTER — Encounter: Payer: Self-pay | Admitting: Internal Medicine

## 2021-01-13 ENCOUNTER — Telehealth: Payer: Self-pay | Admitting: Internal Medicine

## 2021-01-13 ENCOUNTER — Ambulatory Visit: Payer: Medicare Other

## 2021-01-13 NOTE — Telephone Encounter (Signed)
-----   Message from [name], DO sent at 01/11/2021  9:53 AM EDT ----- Please schedule patient for a hospital follow up appointment in 1-2 weeks. And just wanted to confirm she has a follow up with our Coumadin Monday as well! Thanks!

## 2021-01-13 NOTE — Telephone Encounter (Signed)
TOC HFU appointment 01/27/2021 at 8:45 am with Dr. Marianna Payment.  Please refer to message below.  Just spoke with patient and she agreed to appointment date and time.

## 2021-01-15 ENCOUNTER — Telehealth: Payer: Self-pay

## 2021-01-15 ENCOUNTER — Ambulatory Visit: Payer: Medicare Other | Admitting: Internal Medicine

## 2021-01-15 NOTE — Telephone Encounter (Signed)
pt is requesting a call back .. she stated that she wants to come in to do blood work only she stated that's he was on a medication (enoxaparin (LOVENOX) 80 MG/0.8ML injection) and she is just wanting to know if her blood count is ok .

## 2021-01-16 ENCOUNTER — Ambulatory Visit (INDEPENDENT_AMBULATORY_CARE_PROVIDER_SITE_OTHER): Payer: Medicare Other | Admitting: Pharmacist

## 2021-01-16 DIAGNOSIS — Z952 Presence of prosthetic heart valve: Secondary | ICD-10-CM

## 2021-01-16 DIAGNOSIS — Z7901 Long term (current) use of anticoagulants: Secondary | ICD-10-CM

## 2021-01-16 DIAGNOSIS — M79641 Pain in right hand: Secondary | ICD-10-CM

## 2021-01-16 DIAGNOSIS — I2699 Other pulmonary embolism without acute cor pulmonale: Secondary | ICD-10-CM | POA: Diagnosis not present

## 2021-01-16 HISTORY — DX: Pain in right hand: M79.641

## 2021-01-16 LAB — POCT INR: INR: 1 — AB (ref 2.0–3.0)

## 2021-01-16 LAB — ANTINUCLEAR ANTIBODIES, IFA: ANA Ab, IFA: NEGATIVE

## 2021-01-16 MED ORDER — ENOXAPARIN SODIUM 80 MG/0.8ML IJ SOSY: 80.0000 mg | PREFILLED_SYRINGE | Freq: Two times a day (BID) | INTRAMUSCULAR | 0 refills | Status: DC

## 2021-01-16 NOTE — Progress Notes (Signed)
Anticoagulation Management Kim Anthony is a 70 y.o. female who reports to the clinic for monitoring of warfarin treatment.    Indication:  History of PE; History of mitral valve replacement. Oral anticoagulation with warfarin to maintain INR 2.5 - 3.5  Duration: indefinite Supervising physician: Aldine Contes  Anticoagulation Clinic Visit History: Patient does not report signs/symptoms of bleeding or thromboembolism  Other recent changes: No diet, medications, lifestyle changes except at noted in patient findings.  Anticoagulation Episode Summary     Current INR goal:  2.5-3.5  TTR:  28.5 % (4.9 y)  Next INR check:  01/22/2021  INR from last check:  1.0 (01/16/2021)  Weekly max warfarin dose:    Target end date:    INR check location:  Anticoagulation Clinic  Preferred lab:    Send INR reminders to:     Indications   Pulmonary embolism (HCC) [I26.99]        Comments:           Allergies  Allergen Reactions   Lisinopril Swelling   Acetaminophen-Codeine Itching   Propoxyphene Itching    Darvocet   Tape Rash    Plastic   Tramadol Itching and Nausea And Vomiting    Current Outpatient Medications:    albuterol (PROVENTIL) (2.5 MG/3ML) 0.083% nebulizer solution, USE 1 VIAL IN NEBULIZER EVERY 6 HOURS AS NEEDED FOR WHEEZING FOR SHORTNESS OF BREATH, Disp: 90 mL, Rfl: 2   albuterol (VENTOLIN HFA) 108 (90 Base) MCG/ACT inhaler, Inhale 1-2 puffs into the lungs every 6 (six) hours as needed for wheezing or shortness of breath., Disp: 6.7 g, Rfl: 11   aspirin 81 MG chewable tablet, Chew 1 tablet (81 mg total) by mouth daily., Disp: 30 tablet, Rfl: 0   dapagliflozin propanediol (FARXIGA) 10 MG TABS tablet, Take 1 tablet (10 mg total) by mouth daily., Disp: 30 tablet, Rfl: 0   fluticasone (FLONASE) 50 MCG/ACT nasal spray, Place 1 spray into both nostrils daily as needed for allergies., Disp: 16 each, Rfl: 11   furosemide (LASIX) 40 MG tablet, Take 1 tablet (40 mg total) by mouth 2  (two) times daily., Disp: 30 tablet, Rfl: 0   gabapentin (NEURONTIN) 600 MG tablet, Take 1.5 tablets (900 mg total) by mouth 3 (three) times daily. For nerve pain- increased to get her off lyrica, Disp: 150 tablet, Rfl: 5   hydrALAZINE (APRESOLINE) 25 MG tablet, Take 0.5 tablets (12.5 mg total) by mouth every 8 (eight) hours., Disp: 30 tablet, Rfl: 0   HYDROcodone-acetaminophen (NORCO) 5-325 MG tablet, Take 1 tablet by mouth every 4 (four) hours as needed for moderate pain., Disp: 150 tablet, Rfl: 0   isosorbide mononitrate (IMDUR) 30 MG 24 hr tablet, Take 1 tablet (30 mg total) by mouth daily., Disp: 30 tablet, Rfl: 0   metoprolol succinate (TOPROL-XL) 25 MG 24 hr tablet, Take 1 tablet (25 mg total) by mouth daily., Disp: 30 tablet, Rfl: 0   potassium chloride SA (KLOR-CON) 20 MEQ tablet, Take 2 tablets (40 mEq total) by mouth 2 (two) times daily., Disp: 60 tablet, Rfl: 0   Spacer/Aero-Holding Chambers (BREATHERITE COLL SPACER ADULT) MISC, 1 applicator by Does not apply route daily., Disp: 1 each, Rfl: 0   spironolactone (ALDACTONE) 25 MG tablet, Take 0.5 tablets (12.5 mg total) by mouth daily., Disp: 30 tablet, Rfl: 0   STIOLTO RESPIMAT 2.5-2.5 MCG/ACT AERS, INHALE 2 PUFFS BY MOUTH ONCE DAILY (Patient taking differently: Take 2 puffs by mouth daily.), Disp: 4 g, Rfl: 0  Calcium-Phosphorus-Vitamin D (CITRACAL +D3 PO), Take 1 tablet by mouth 2 (two) times a day. (Patient not taking: Reported on 01/16/2021), Disp: , Rfl:    cetirizine (ZYRTEC ALLERGY) 10 MG tablet, Take 1 tablet (10 mg total) by mouth daily. (Patient not taking: Reported on 01/16/2021), Disp: 30 tablet, Rfl: 2   enoxaparin (LOVENOX) 80 MG/0.8ML injection, Inject 0.8 mLs (80 mg total) into the skin every 12 (twelve) hours for 5 days. CONTINUE using the injections until your INR is > 2.0, Disp: 8 mL, Rfl: 0   nicotine (NICODERM CQ - DOSED IN MG/24 HR) 7 mg/24hr patch, Place 1 patch (7 mg total) onto the skin daily. (Patient not taking:  Reported on 01/16/2021), Disp: 28 patch, Rfl: 0   simvastatin (ZOCOR) 20 MG tablet, Take 1 tablet by mouth once daily (Patient not taking: Reported on 01/16/2021), Disp: 90 tablet, Rfl: 0   warfarin (COUMADIN) 5 MG tablet, Take 1 tablet (5 mg total) by mouth one time only at 4 PM. (Patient not taking: Reported on 01/16/2021), Disp: 30 tablet, Rfl: 0 Past Medical History:  Diagnosis Date   Arthritis    Asthma    Breast cancer ([place]) 2001   Left Breast Cancer   CHF (congestive heart failure) (HCC)    COPD (chronic obstructive pulmonary disease) ([place])    Coronary artery disease    History of mitral valve replacement with mechanical valve    HLD (hyperlipidemia)    Hypertension    Pulmonary embolism ([place])    Social History   Socioeconomic History   Marital status: Single    Spouse name: Not on file   Number of children: Not on file   Years of education: Not on file   Highest education level: Not on file  Occupational History   Not on file  Tobacco Use   Smoking status: Every Day    Packs/day: 0.25    Years: 30.00    Pack years: 7.50    Types: Cigarettes   Smokeless tobacco: Never   Tobacco comments:    Sometimes less. 4 per day  Vaping Use   Vaping Use: Never used  Substance and Sexual Activity   Alcohol use: Yes    Comment: beer- ocasional   Drug use: No   Sexual activity: Not Currently    Birth control/protection: None  Other Topics Concern   Not on file  Social History Narrative   ** Merged History Encounter **       Social Determinants of Health   Financial Resource Strain: Medium Risk   Difficulty of Paying Living Expenses: Somewhat hard  Food Insecurity: Landscape architect Present   Worried About Charity fundraiser in the Last Year: Never true   Ran Out of Food in the Last Year: Sometimes true  Transportation Needs: No Transportation Needs   Lack of Transportation (Medical): No   Lack of Transportation (Non-Medical): No  Physical Activity: Not on file  Stress:  Not on file  Social Connections: Not on file   Family History  Problem Relation Age of Onset   Heart attack Other    Breast cancer Maternal Aunt 50    ASSESSMENT Recent Results: The most recent result is correlated with ZERO mg per week--but HAS been on '80mg'$  enoxaparin SQ q 12h. Patient states she was not aware she was supposed to be on warfarin--as did her daughter who I spoke with on the phone.  Lab Results  Component Value Date   INR 1.0 (A) 01/16/2021  INR 1.7 (H) 01/11/2021   INR 1.5 (H) 01/10/2021    Anticoagulation Dosing: Description   Patient has NOT been taking warfarin status-post discharge per her reporting. States she thought she was only supposed to take "lovenox shots." Will recommence warfarin, '5mg'$  PO daily at 6PM and CONTINUE overlap of enoxaparin/Lovenox '80mg'$  syringe, administer subcutaneously every 12 hours at level of the waistband.      INR today: Subtherapeutic  PLAN Weekly dose was increased by recommencing warfarin at '5mg'$  daily with enoxaparin '80mg'$  SQ q12h until INR > 2.5 (for valve).   Patient Instructions  Patient instructed to take medications as defined in the Anti-coagulation Track section of this encounter.  Patient instructed to take today's dose.  Patient instructed to take one of your '5mg'$  peach-colored warfarin tablets by mouth daily at [facility]. Patient instructed to return to clinic for INR test on Wednesday January 22, 2021 at 10:00 am.  Patient verbalized understanding of these instructions.   Patient advised to contact clinic or seek medical attention if signs/symptoms of bleeding or thromboembolism occur.  Patient verbalized understanding by repeating back information and was advised to contact me if further medication-related questions arise. Patient was also provided an information handout.  Follow-up Return in 6 days (on 01/22/2021) for Follow up INR.  Pennie Banter, PharmD, CPP  15 minutes spent face-to-face with the patient during the  encounter. 50% of time spent on education, including signs/sx bleeding and clotting, as well as food and drug interactions with warfarin. 50% of time was spent on fingerprick POC INR sample collection,processing, results determination, and documentation in [email or web address]

## 2021-01-16 NOTE — Patient Instructions (Signed)
Patient instructed to take medications as defined in the Anti-coagulation Track section of this encounter.  Patient instructed to take today's dose.  Patient instructed to take one of your '5mg'$  peach-colored warfarin tablets by mouth daily at [facility]. Patient instructed to return to clinic for INR test on Wednesday January 22, 2021 at 10:00 am.  Patient verbalized understanding of these instructions.

## 2021-01-20 ENCOUNTER — Other Ambulatory Visit: Payer: Self-pay | Admitting: Internal Medicine

## 2021-01-20 DIAGNOSIS — I5042 Chronic combined systolic (congestive) and diastolic (congestive) heart failure: Secondary | ICD-10-CM

## 2021-01-20 DIAGNOSIS — Z7901 Long term (current) use of anticoagulants: Secondary | ICD-10-CM

## 2021-01-20 MED ORDER — WARFARIN SODIUM 5 MG PO TABS: 5.0000 mg | ORAL_TABLET | Freq: Once | ORAL | 2 refills | Status: DC

## 2021-01-20 MED ORDER — FUROSEMIDE 40 MG PO TABS: 40.0000 mg | ORAL_TABLET | Freq: Two times a day (BID) | ORAL | 2 refills | Status: DC

## 2021-01-21 ENCOUNTER — Other Ambulatory Visit: Payer: Self-pay

## 2021-01-21 ENCOUNTER — Ambulatory Visit (HOSPITAL_COMMUNITY)
Admission: RE | Admit: 2021-01-21 | Discharge: 2021-01-21 | Disposition: A | Discharge status: Home / Self Care | Payer: Medicare Other | Source: Ambulatory Visit | Attending: Family Medicine | Admitting: Family Medicine

## 2021-01-21 ENCOUNTER — Other Ambulatory Visit: Payer: Self-pay | Admitting: Pharmacist

## 2021-01-21 ENCOUNTER — Telehealth: Payer: Self-pay | Admitting: Pharmacist

## 2021-01-21 ENCOUNTER — Encounter (HOSPITAL_COMMUNITY): Payer: Self-pay

## 2021-01-21 VITALS — BP 139/70 | HR 86 | Wt 176.0 lb

## 2021-01-21 DIAGNOSIS — Z853 Personal history of malignant neoplasm of breast: Secondary | ICD-10-CM | POA: Insufficient documentation

## 2021-01-21 DIAGNOSIS — I071 Rheumatic tricuspid insufficiency: Secondary | ICD-10-CM

## 2021-01-21 DIAGNOSIS — I10 Essential (primary) hypertension: Secondary | ICD-10-CM | POA: Diagnosis not present

## 2021-01-21 DIAGNOSIS — Z923 Personal history of irradiation: Secondary | ICD-10-CM | POA: Insufficient documentation

## 2021-01-21 DIAGNOSIS — Z720 Tobacco use: Secondary | ICD-10-CM | POA: Diagnosis not present

## 2021-01-21 DIAGNOSIS — Z86718 Personal history of other venous thrombosis and embolism: Secondary | ICD-10-CM | POA: Insufficient documentation

## 2021-01-21 DIAGNOSIS — Z8249 Family history of ischemic heart disease and other diseases of the circulatory system: Secondary | ICD-10-CM | POA: Insufficient documentation

## 2021-01-21 DIAGNOSIS — Z7982 Long term (current) use of aspirin: Secondary | ICD-10-CM | POA: Insufficient documentation

## 2021-01-21 DIAGNOSIS — I2510 Atherosclerotic heart disease of native coronary artery without angina pectoris: Secondary | ICD-10-CM | POA: Diagnosis not present

## 2021-01-21 DIAGNOSIS — Z9221 Personal history of antineoplastic chemotherapy: Secondary | ICD-10-CM | POA: Diagnosis not present

## 2021-01-21 DIAGNOSIS — Z596 Low income: Secondary | ICD-10-CM | POA: Insufficient documentation

## 2021-01-21 DIAGNOSIS — J449 Chronic obstructive pulmonary disease, unspecified: Secondary | ICD-10-CM | POA: Diagnosis not present

## 2021-01-21 DIAGNOSIS — I110 Hypertensive heart disease with heart failure: Secondary | ICD-10-CM | POA: Diagnosis not present

## 2021-01-21 DIAGNOSIS — I5042 Chronic combined systolic (congestive) and diastolic (congestive) heart failure: Secondary | ICD-10-CM | POA: Diagnosis not present

## 2021-01-21 DIAGNOSIS — Z79899 Other long term (current) drug therapy: Secondary | ICD-10-CM | POA: Insufficient documentation

## 2021-01-21 DIAGNOSIS — Z86711 Personal history of pulmonary embolism: Secondary | ICD-10-CM

## 2021-01-21 DIAGNOSIS — Z7901 Long term (current) use of anticoagulants: Secondary | ICD-10-CM | POA: Diagnosis not present

## 2021-01-21 DIAGNOSIS — Z888 Allergy status to other drugs, medicaments and biological substances status: Secondary | ICD-10-CM | POA: Diagnosis not present

## 2021-01-21 DIAGNOSIS — Z952 Presence of prosthetic heart valve: Secondary | ICD-10-CM | POA: Diagnosis not present

## 2021-01-21 LAB — COMPREHENSIVE METABOLIC PANEL
ALT: 48 U/L — ABNORMAL HIGH (ref 0–44)
AST: 75 U/L — ABNORMAL HIGH (ref 15–41)
Albumin: 3.9 g/dL (ref 3.5–5.0)
Alkaline Phosphatase: 115 U/L (ref 38–126)
Anion gap: 14 (ref 5–15)
BUN: 30 mg/dL — ABNORMAL HIGH (ref 8–23)
CO2: 20 mmol/L — ABNORMAL LOW (ref 22–32)
Calcium: 9.8 mg/dL (ref 8.9–10.3)
Chloride: 97 mmol/L — ABNORMAL LOW (ref 98–111)
Creatinine, Ser: 1.18 mg/dL — ABNORMAL HIGH (ref 0.44–1.00)
GFR, Estimated: 49 mL/min — ABNORMAL LOW (ref >60)
Glucose, Bld: 84 mg/dL (ref 70–99)
Potassium: 4.8 mmol/L (ref 3.5–5.1)
Sodium: 131 mmol/L — ABNORMAL LOW (ref 135–145)
Total Bilirubin: 0.9 mg/dL (ref 0.3–1.2)
Total Protein: 8.4 g/dL — ABNORMAL HIGH (ref 6.5–8.1)

## 2021-01-21 MED ORDER — FUROSEMIDE 40 MG PO TABS: 40.0000 mg | ORAL_TABLET | Freq: Every day | ORAL | 2 refills | Status: DC

## 2021-01-21 MED ORDER — ENOXAPARIN SODIUM 80 MG/0.8ML IJ SOSY: 80.0000 mg | PREFILLED_SYRINGE | Freq: Two times a day (BID) | INTRAMUSCULAR | 0 refills | Status: DC

## 2021-01-21 MED ORDER — NICOTINE 7 MG/24HR TD PT24: MEDICATED_PATCH | TRANSDERMAL | 0 refills | Status: DC

## 2021-01-21 MED ORDER — SPIRONOLACTONE 25 MG PO TABS: 25.0000 mg | ORAL_TABLET | Freq: Every day | ORAL | 6 refills | Status: DC

## 2021-01-21 NOTE — Progress Notes (Signed)
Advanced Heart Failure Clinic Note   PCP: Sid Falcon, MD HF Cardiologist: Dr. Haroldine Laws  HPI: Kim Anthony is a 70 y.o. female with a history of mechanical MVR in 2001 in [place], [place], hx breast cancer s/p left mastectomy and radiation/chemo, tobacco use, COPD, prior PE/DVT, HTN, undergoing workup for possible inflammatory arthritis with Rheumatology, prior CABG (she is unaware of prior CAD), and systolic CHF.   EF (2017) 45-50% (no EF on file prior to this). Had improved to 55-60% on Echo in July 2020.  She presented to [facility] 01/05/21 with A/C CHF exacerbation and likely COPD exacerbation. Received steroids, bronchodilators, and 40 mg lasix IV. She was admitted for management of COPD exacerbation and acute on chronic diastolic CHF. Echo with EF of 35-40%, previously 55-60% on echo July 2020. No significant MR. Mildly enlarged RV. Etiology for decline in EF was not certain so she was scheduled for [facility] to rule out obstructive CAD. Once INR was acceptable, she underwent R/LHC showing 1v CAD with occluded mRCA and stable hemodynamics. IV Lasix was transitioned to PO 80 mg bid, and she was started on GDMT; no ACEi or ARNi due to history of angioedema. Hospitalization c/b transaminitis. + Hep B E antigen and low levels of Hep B surface antibody, but Hep B antigen negative. Her atorvastatin was decreased to 40 mg and Hep B DNA pending at discharge. She was bridged with Lovenox until her INR>2.5 and discharged home; discharge weight 177 lbs.   Today she returns for post hospitalization HF follow up. Overall feeling fine. Denies increasing SOB, CP, dizziness, edema, or PND/Orthopnea. Appetite ok. No fever or chills. Weight at home 171-174 pounds. Taking all medications. Drinks 16 oz beer 3x/week.  Cardiac Studies: - Echo (2020): EF 55-60% - Echo (7/22): EF 35-40% (Dr. Jerilee Field felt EF 45%) - R/LHC (7/22): with 1v CAD with occluded mRCA, RA 9, PCW 14, CI 2.9.    Review of Systems: [y] = yes, '[ ]'$  =  no   General: Weight gain '[ ]'$ ; Weight loss '[ ]'$ ; Anorexia '[ ]'$ ; Fatigue '[ ]'$ ; Fever '[ ]'$ ; Chills '[ ]'$ ; Weakness '[ ]'$   Cardiac: Chest pain/pressure '[ ]'$ ; Resting SOB '[ ]'$ ; Exertional SOB '[ ]'$ ; Orthopnea '[ ]'$ ; Pedal Edema '[ ]'$ ; Palpitations '[ ]'$ ; Syncope '[ ]'$ ; Presyncope '[ ]'$ ; Paroxysmal nocturnal dyspnea'[ ]'$   Pulmonary: Cough '[ ]'$ ; Wheezing'[ ]'$ ; Hemoptysis'[ ]'$ ; Sputum '[ ]'$ ; Snoring '[ ]'$   GI: Vomiting'[ ]'$ ; Dysphagia'[ ]'$ ; Melena'[ ]'$ ; Hematochezia '[ ]'$ ; Heartburn'[ ]'$ ; Abdominal pain '[ ]'$ ; Constipation '[ ]'$ ; Diarrhea '[ ]'$ ; BRBPR '[ ]'$   GU: Hematuria'[ ]'$ ; Dysuria '[ ]'$ ; Nocturia'[ ]'$   Vascular: Pain in legs with walking '[ ]'$ ; Pain in feet with lying flat '[ ]'$ ; Non-healing sores '[ ]'$ ; Stroke '[ ]'$ ; TIA '[ ]'$ ; Slurred speech '[ ]'$ ;  Neuro: Headaches'[ ]'$ ; Vertigo'[ ]'$ ; Seizures'[ ]'$ ; Paresthesias'[ ]'$ ;Blurred vision '[ ]'$ ; Diplopia '[ ]'$ ; Vision changes '[ ]'$   Ortho/Skin: Arthritis [y]; Joint pain '[ ]'$ ; Muscle pain '[ ]'$ ; Joint swelling '[ ]'$ ; Back Pain '[ ]'$ ; Rash '[ ]'$   Psych: Depression'[ ]'$ ; Anxiety'[ ]'$   Heme: Bleeding problems '[ ]'$ ; Clotting disorders '[ ]'$ ; Anemia '[ ]'$   Endocrine: Diabetes '[ ]'$ ; Thyroid dysfunction'[ ]'$   Past Medical History:  Diagnosis Date   Arthritis    Asthma    Breast cancer ([place]) 2001   Left Breast Cancer   CHF (congestive heart failure) (HCC)    COPD (chronic obstructive pulmonary disease) (HCC)    Coronary artery disease  History of mitral valve replacement with mechanical valve    HLD (hyperlipidemia)    Hypertension    Pulmonary embolism (HCC)    Current Outpatient Medications  Medication Sig Dispense Refill   albuterol (PROVENTIL) (2.5 MG/3ML) 0.083% nebulizer solution USE 1 VIAL IN NEBULIZER EVERY 6 HOURS AS NEEDED FOR WHEEZING FOR SHORTNESS OF BREATH 90 mL 2   albuterol (VENTOLIN HFA) 108 (90 Base) MCG/ACT inhaler Inhale 1-2 puffs into the lungs every 6 (six) hours as needed for wheezing or shortness of breath. 6.7 g 11   aspirin 81 MG chewable tablet Chew 1 tablet (81 mg total) by mouth daily. 30 tablet 0   dapagliflozin  propanediol (FARXIGA) 10 MG TABS tablet Take 1 tablet (10 mg total) by mouth daily. 30 tablet 0   enoxaparin (LOVENOX) 80 MG/0.8ML injection Inject 0.8 mLs (80 mg total) into the skin every 12 (twelve) hours for 5 days. CONTINUE using the injections until your INR is > 2.0 8 mL 0   fluticasone (FLONASE) 50 MCG/ACT nasal spray Place 1 spray into both nostrils daily as needed for allergies. 16 each 11   furosemide (LASIX) 40 MG tablet Take 1 tablet (40 mg total) by mouth 2 (two) times daily. 30 tablet 2   gabapentin (NEURONTIN) 600 MG tablet Take 1.5 tablets (900 mg total) by mouth 3 (three) times daily. For nerve pain- increased to get her off lyrica 150 tablet 5   hydrALAZINE (APRESOLINE) 25 MG tablet Take 0.5 tablets (12.5 mg total) by mouth every 8 (eight) hours. 30 tablet 0   isosorbide mononitrate (IMDUR) 30 MG 24 hr tablet Take 1 tablet (30 mg total) by mouth daily. 30 tablet 0   metoprolol succinate (TOPROL-XL) 25 MG 24 hr tablet Take 1 tablet (25 mg total) by mouth daily. 30 tablet 0   potassium chloride SA (KLOR-CON) 20 MEQ tablet Take 2 tablets (40 mEq total) by mouth 2 (two) times daily. 60 tablet 0   simvastatin (ZOCOR) 20 MG tablet Take 1 tablet by mouth once daily 90 tablet 0   Spacer/Aero-Holding Chambers (BREATHERITE COLL SPACER ADULT) MISC 1 applicator by Does not apply route daily. 1 each 0   spironolactone (ALDACTONE) 25 MG tablet Take 0.5 tablets (12.5 mg total) by mouth daily. 30 tablet 0   STIOLTO RESPIMAT 2.5-2.5 MCG/ACT AERS INHALE 2 PUFFS BY MOUTH ONCE DAILY 4 g 0   warfarin (COUMADIN) 5 MG tablet Take 1 tablet (5 mg total) by mouth one time only at 4 PM. 30 tablet 2   No current facility-administered medications for this encounter.   Allergies  Allergen Reactions   Lisinopril Swelling   Acetaminophen-Codeine Itching   Propoxyphene Itching    Darvocet   Tape Rash    Plastic   Tramadol Itching and Nausea And Vomiting   Social History   Socioeconomic History    Marital status: Single    Spouse name: Not on file   Number of children: Not on file   Years of education: Not on file   Highest education level: Not on file  Occupational History   Not on file  Tobacco Use   Smoking status: Every Day    Packs/day: 0.25    Years: 30.00    Pack years: 7.50    Types: Cigarettes   Smokeless tobacco: Never   Tobacco comments:    Sometimes less. 4 per day  Vaping Use   Vaping Use: Never used  Substance and Sexual Activity   Alcohol use: Yes  Comment: beer- ocasional   Drug use: No   Sexual activity: Not Currently    Birth control/protection: None  Other Topics Concern   Not on file  Social History Narrative   ** Merged History Encounter **       Social Determinants of Health   Financial Resource Strain: Medium Risk   Difficulty of Paying Living Expenses: Somewhat hard  Food Insecurity: Landscape architect Present   Worried About Charity fundraiser in the Last Year: Never true   Ran Out of Food in the Last Year: Sometimes true  Transportation Needs: No Transportation Needs   Lack of Transportation (Medical): No   Lack of Transportation (Non-Medical): No  Physical Activity: Not on file  Stress: Not on file  Social Connections: Not on file  Intimate Partner Violence: Not on file    Family History  Problem Relation Age of Onset   Heart attack Other    Breast cancer Maternal Aunt 50   BP 139/70   Pulse 86   Wt 79.8 kg (176 lb)   SpO2 95%   BMI 29.29 kg/m   Wt Readings from Last 3 Encounters:  01/21/21 79.8 kg (176 lb)  01/11/21 80.6 kg (177 lb 9.6 oz)  12/05/20 81.3 kg (179 lb 4.8 oz)   PHYSICAL EXAM: General:  NAD. No resp difficulty HEENT: Normal Neck: Supple. No JVD. Carotids 2+ bilat; no bruits. No lymphadenopathy or thryomegaly appreciated. Cor: PMI nondisplaced. Regular rate & rhythm. No rubs, gallops or murmurs, + mechanical S1 Lungs: Clear Abdomen: Soft, nontender, nondistended. No hepatosplenomegaly. No bruits or  masses. Good bowel sounds. Extremities: No cyanosis, clubbing, rash, edema Neuro: Alert & oriented x 3, cranial nerves grossly intact. Moves all 4 extremities w/o difficulty. Affect pleasant.  ECG: SR w/ PVC, qrs 112 ms (personally reviewed).  ASSESSMENT & PLAN: Chronic combined CHF: - Echo (2017): EF 45-50% in setting of PE. Has mild to moderate RV dysfunction. MV prosthesis okay. - Echo (7/22): EF 35-40% this admit (Dr. Jerilee Field felt EF 45%), previously 55-60% in 2020.  - R/LHC (01/09/21): with 1v CAD mRCA chronically occluded and well compensated hemodynamics. - NYHA II, volume good today. - Increase spiro to 25 mg daily. - Decrease lasix to 40 mg daily. - Continue Toprol XL 25 mg daily. - Continue hydralazine 12.5 mg tid + Imdur 30 mg daily. - Continue Farxiga 10 mg daily. - H/o angioedema with ACE-I, so no ARNi either. - CMET today, repeat BMET in 10 days.  2. CAD  - 1 V CAD with occluded mid RCA on cath (7/22) - No CP. - On ASA + beta blocker + statin.  - Switched to simvastatin 20 mg due to elevated LFTs. - CMET today.    3. Hx of mechanical mitral valve replacement: - Mechanical MVR in [place] in 2001. - Echo (7/22) mean gradient of 4 mmHg with no significant MR. - Continue aspirin. - Continue Coumadin. Currently on loveonx bridge until INR therapeutic.   4. Tricuspid valve regurgitation: - Moderate by echo.   5. Hx DVT/PE: - On Coumadin. - INRs followed by Coumadin Clinic.   6. HTN: - Stable. - Continue current regimen.  7. COPD - Continue inhalers. - No wheezes on exam.   8. Smoker: - Smoking cessation discussed. - Continue nicotine patch.  Follow up in 3-4 weeks with PharmD for further medication titration and in 6-8 weeks with APP   Rafael Bihari, FNP 01/21/21

## 2021-01-21 NOTE — Patient Instructions (Signed)
INCREASE Spironolactone to 25 mg, one tab daily DECREASE Lasix to 40 mg, one tab daily START Nicotine patches-14 mg/day for 6 weeks, then 7 mg daily for 2 weeks  Labs today We will only contact you if something comes back abnormal or we need to make some changes. Otherwise no news is good news!  Labs needed in 7-10 days  Your physician recommends that you schedule a follow-up appointment in: 3-4 weeks with the pharmacy team  Do the following things EVERYDAY: Weigh yourself in the morning before breakfast. Write it down and keep it in a log. Take your medicines as prescribed Eat low salt foods--Limit salt (sodium) to 2000 mg per day.  Stay as active as you can everyday Limit all fluids for the day to less than 2 liters  milAt the Advanced [facility] Failure Clinic, you and your health needs are our priority. As part of our continuing mission to provide you with exceptional heart care, we have created designated Provider Care Teams. These Care Teams include your primary Cardiologist (physician) and Advanced Practice Providers (APPs- Physician Assistants and Nurse Practitioners) who all work together to provide you with the care you need, when you need it.   You may see any of the following providers on your designated Care Team at your next follow up: Dr Glori Bickers Dr Loralie Champagne Dr Patrice Paradise, NP Lyda Jester, [place] Ginnie Smart Audry Riles, PharmD   Please be sure to bring in all your medications bottles to every appointment.

## 2021-01-21 NOTE — Telephone Encounter (Signed)
Called to [facility] to see the patient who had an appointment at [facility]. Fingerstick point of care INR = 1.5. Will increase warfarin to 7.'5mg'$  for 3 consecutive days; then '5mg'$  QD with CONTINUED enoxaparin/Lovenox '80mg'$  SQ q12h overlap. Will see patient in the [facility] on Monday 8-AUG-22.

## 2021-01-21 NOTE — Telephone Encounter (Signed)
Patient requests refill on enoxaparin '80mg'$  #10 syringes. Inject '80mg'$ /0.32m SQ q 12h until INR > 2.5.

## 2021-01-22 ENCOUNTER — Other Ambulatory Visit (HOSPITAL_COMMUNITY): Payer: Self-pay | Admitting: Family Medicine

## 2021-01-22 ENCOUNTER — Encounter: Payer: Self-pay | Admitting: Internal Medicine

## 2021-01-22 DIAGNOSIS — R7989 Other specified abnormal findings of blood chemistry: Secondary | ICD-10-CM

## 2021-01-23 ENCOUNTER — Other Ambulatory Visit: Payer: Self-pay | Admitting: Physician Assistant

## 2021-01-23 ENCOUNTER — Telehealth (HOSPITAL_COMMUNITY): Payer: Self-pay | Admitting: Cardiology

## 2021-01-23 DIAGNOSIS — M25531 Pain in right wrist: Secondary | ICD-10-CM

## 2021-01-23 DIAGNOSIS — E876 Hypokalemia: Secondary | ICD-10-CM

## 2021-01-23 NOTE — Telephone Encounter (Signed)
-----   Message from Rafael Bihari, [place] sent at 01/22/2021  6:56 PM EDT ----- Liver enzymes remained elevated, but trending back down. Will follow with next lab draw.

## 2021-01-23 NOTE — Telephone Encounter (Signed)
Patient called.  Patient aware.  

## 2021-01-27 ENCOUNTER — Ambulatory Visit: Payer: Medicare Other | Admitting: Pharmacist

## 2021-01-27 ENCOUNTER — Encounter: Payer: Self-pay | Admitting: Internal Medicine

## 2021-01-27 ENCOUNTER — Ambulatory Visit (INDEPENDENT_AMBULATORY_CARE_PROVIDER_SITE_OTHER): Payer: Medicare Other | Admitting: Internal Medicine

## 2021-01-27 ENCOUNTER — Other Ambulatory Visit: Payer: Self-pay

## 2021-01-27 VITALS — BP 118/85 | HR 73 | Temp 89.6°F | Ht 65.0 in | Wt 176.8 lb

## 2021-01-27 DIAGNOSIS — Z7901 Long term (current) use of anticoagulants: Secondary | ICD-10-CM | POA: Diagnosis not present

## 2021-01-27 DIAGNOSIS — Z952 Presence of prosthetic heart valve: Secondary | ICD-10-CM

## 2021-01-27 DIAGNOSIS — R7401 Elevation of levels of liver transaminase levels: Secondary | ICD-10-CM | POA: Diagnosis not present

## 2021-01-27 DIAGNOSIS — I5042 Chronic combined systolic (congestive) and diastolic (congestive) heart failure: Secondary | ICD-10-CM

## 2021-01-27 DIAGNOSIS — R768 Other specified abnormal immunological findings in serum: Secondary | ICD-10-CM

## 2021-01-27 DIAGNOSIS — I2699 Other pulmonary embolism without acute cor pulmonale: Secondary | ICD-10-CM

## 2021-01-27 LAB — POCT INR: INR: 2.6 (ref 2.0–3.0)

## 2021-01-27 MED ORDER — POTASSIUM CHLORIDE CRYS ER 20 MEQ PO TBCR
40.0000 meq | EXTENDED_RELEASE_TABLET | Freq: Every day | ORAL | 0 refills | Status: DC
Start: 2021-01-27 — End: 2021-02-13

## 2021-01-27 NOTE — Assessment & Plan Note (Addendum)
Assessment: Patient presents for hospital follow-up.  She recently had labs showing elevated transaminase with a positive hepatitis B, E antigen with a low hepatitis core B antibiotic.  Patient states that she is never been vaccinated against hepatitis B in the past.  She denies any significant high risk behaviors that could contribute to her developing hepatitis B.   Plan: - Will repeat hepatitis B labs -Patient has a hepatitis B DNA pending, will call the lab to check on status.  **ADDENDUM** Patient's CMP showed improvement of her transaminase elevations.  However, she does have an elevated alk phos at 142, with a normal bilirubin.  She had recent imaging on 01/2020 with a CTA that did not show any acute pathology in the upper abdomen such as cirrhosis or hepatic steatosis.  Furthermore, patient had repeat HBV labs performed with nonreactive surface antigen/antibody, negative core IgM and total antibody, negative E antigen, and pending E antibody and HBV DNA.  Based on these results, patient is unlikely to have HBV.  Called the [facility] laboratoy to check on HBV DNA level.  The DNA level was unable to be performed due to insufficient sample.  We will consider repeating this if her screening labs are concerning for

## 2021-01-27 NOTE — Telephone Encounter (Signed)
-----   Message from Rafael Bihari, [place] sent at 01/22/2021  9:05 AM EDT ----- Liver function improving. Please change potassium regimen to 20 mEq KCl daily.

## 2021-01-27 NOTE — Patient Instructions (Signed)
Thank you, Kim Anthony for allowing [place] to provide your care today. Today we discussed Heart failure, elevated liver enzymes, anticoagulation.    I have ordered the following labs for you:   Lab Orders  CMP14 + Anion Gap  Hepatitis B Surface Antibody  Hepatitis B Surface Antigen  HBV Core Ab, IgG/IgM Diff  Hepatitis B E Antigen  Hepatitis B E Antibody  POCT INR    Tests ordered today:  none  Referrals ordered today:   Referral Orders  No referral(s) requested today     Medication Changes:   There are no discontinued medications.     Follow up: 3 months with primary care physician   Remember: Should you have any questions or concerns please call the internal medicine clinic at [phone].     Kim Anthony, D.O. [place]

## 2021-01-27 NOTE — Assessment & Plan Note (Addendum)
Assessment: Patient presents for follow-up from her recent pleural hospital admission for acute on chronic heart failure.  Patient has since followed up with her outpatient heart failure team.  She is tolerating her guideline directed heart failure medications well.  Currently she is taking furosemide 40 mg daily, spironolactone 25 mg daily, metoprolol 25 mg daily, isosorbide mononitrate 30 mg daily, hydralazine 25 mg daily, and Farxiga 10 mg daily.  Patient states that she is breathing much better and has better exercise tolerance.  She denies any signs or symptoms of hypervolemia on exam today.  She is currently 176 pounds and at her dry weight.  She states that she is trying to maintain salt and fluid restrictions as best as possible.   Plan: -Continue guideline directed heart failure medications -2 g salt restriction and 1500 cc fluid restriction as possible -Daily weights -We will check kidney function electrolytes today.

## 2021-01-27 NOTE — Progress Notes (Signed)
INR checked by Dr. Elie Confer by finger stick at HF appt.

## 2021-01-27 NOTE — Assessment & Plan Note (Signed)
>>  ASSESSMENT AND PLAN FOR BIVENTRICULAR HEART FAILURE WITH REDUCED LEFT VENTRICULAR FUNCTION (HCC) WRITTEN ON 01/27/2021 11:40 AM BY COE, BENJAMIN, MD  Assessment: Patient presents for follow-up from her recent pleural hospital admission for acute on chronic heart failure.  Patient has since followed up with her outpatient heart failure team.  She is tolerating her guideline directed heart failure medications well.  Currently she is taking furosemide 40 mg daily, spironolactone 25 mg daily, metoprolol 25 mg daily, isosorbide mononitrate 30 mg daily, hydralazine 25 mg daily, and Farxiga 10 mg daily.  Patient states that she is breathing much better and has better exercise tolerance.  She denies any signs or symptoms of hypervolemia on exam today.  She is currently 176 pounds and at her dry weight.  She states that she is trying to maintain salt and fluid restrictions as best as possible.   Plan: -Continue guideline directed heart failure medications -2 g salt restriction and 1500 cc fluid restriction as possible -Daily weights -We will check kidney function electrolytes today.

## 2021-01-27 NOTE — Patient Instructions (Signed)
Patient instructed to take medications as defined in the Anti-coagulation Track section of this encounter.  Patient instructed to take today's dose.  Patient instructed to take one-and-one-half of your '5mg'$  peach colored warfarin tablets on Mondays, Wednesdays, Fridays and Saturdays. On Sundays, Tuesdays and Thursdays, take one (1) tablet of your '5mg'$  peach-colored warfarin tablets.  Patient verbalized understanding of these instructions.

## 2021-01-27 NOTE — Telephone Encounter (Signed)
Pt called patient aware

## 2021-01-27 NOTE — Assessment & Plan Note (Signed)
>>  ASSESSMENT AND PLAN FOR CHRONIC HFREF (HEART FAILURE WITH REDUCED EJECTION FRACTION) (HCC) WRITTEN ON 09/14/2023 11:43 AM BY Deazia Lampi, DO   >>ASSESSMENT AND PLAN FOR CHF (CONGESTIVE HEART FAILURE) (HCC) WRITTEN ON 09/14/2023 11:43 AM BY Gitel Beste, DO   >>ASSESSMENT AND PLAN FOR BIVENTRICULAR HEART FAILURE WITH REDUCED LEFT VENTRICULAR FUNCTION (HCC) WRITTEN ON 01/27/2021 11:40 AM BY COE, BENJAMIN, MD  Assessment: Patient presents for follow-up from her recent pleural hospital admission for acute on chronic heart failure.  Patient has since followed up with her outpatient heart failure team.  She is tolerating her guideline directed heart failure medications well.  Currently she is taking furosemide 40 mg daily, spironolactone 25 mg daily, metoprolol 25 mg daily, isosorbide mononitrate 30 mg daily, hydralazine 25 mg daily, and Farxiga 10 mg daily.  Patient states that she is breathing much better and has better exercise tolerance.  She denies any signs or symptoms of hypervolemia on exam today.  She is currently 176 pounds and at her dry weight.  She states that she is trying to maintain salt and fluid restrictions as best as possible.   Plan: -Continue guideline directed heart failure medications -2 g salt restriction and 1500 cc fluid restriction as possible -Daily weights -We will check kidney function electrolytes today.

## 2021-01-27 NOTE — Assessment & Plan Note (Signed)
Patient has a history of mechanical mitral valve on warfarin.  Recently put on enoxaparin due to subtherapeutic INR.  Patient has an appointment Dr. Johney Maine today to further adjust her warfarin dosage and reevaluate her INR.  -Continue bridge enoxaparin to therapeutic warfarin

## 2021-01-27 NOTE — Progress Notes (Signed)
CC: Heart failure  HPI:  [name] is a 70 y.o. female with a past medical history stated below and presents today for heart failure. Please see problem based assessment and plan for additional details.  Past Medical History:  Diagnosis Date   Arthritis    Asthma    Breast cancer ([place]) 2001   Left Breast Cancer   CHF (congestive heart failure) (HCC)    COPD (chronic obstructive pulmonary disease) (HCC)    Coronary artery disease    History of mitral valve replacement with mechanical valve    HLD (hyperlipidemia)    Hypertension    Pulmonary embolism (HCC)     Current Outpatient Medications on File Prior to Visit  Medication Sig Dispense Refill   albuterol (PROVENTIL) (2.5 MG/3ML) 0.083% nebulizer solution USE 1 VIAL IN NEBULIZER EVERY 6 HOURS AS NEEDED FOR WHEEZING FOR SHORTNESS OF BREATH 90 mL 2   albuterol (VENTOLIN HFA) 108 (90 Base) MCG/ACT inhaler Inhale 1-2 puffs into the lungs every 6 (six) hours as needed for wheezing or shortness of breath. 6.7 g 11   aspirin 81 MG chewable tablet Chew 1 tablet (81 mg total) by mouth daily. 30 tablet 0   dapagliflozin propanediol (FARXIGA) 10 MG TABS tablet Take 1 tablet (10 mg total) by mouth daily. 30 tablet 0   enoxaparin (LOVENOX) 80 MG/0.8ML injection Inject 0.8 mLs (80 mg total) into the skin every 12 (twelve) hours for 5 days. CONTINUE using the injections until your INR is > 2.0 8 mL 0   fluticasone (FLONASE) 50 MCG/ACT nasal spray Place 1 spray into both nostrils daily as needed for allergies. 16 each 11   furosemide (LASIX) 40 MG tablet Take 1 tablet (40 mg total) by mouth daily. 30 tablet 2   gabapentin (NEURONTIN) 600 MG tablet Take 1.5 tablets (900 mg total) by mouth 3 (three) times daily. For nerve pain- increased to get her off lyrica 150 tablet 5   hydrALAZINE (APRESOLINE) 25 MG tablet Take 0.5 tablets (12.5 mg total) by mouth every 8 (eight) hours. 30 tablet 0   isosorbide mononitrate (IMDUR) 30 MG 24 hr tablet Take  1 tablet (30 mg total) by mouth daily. 30 tablet 0   metoprolol succinate (TOPROL-XL) 25 MG 24 hr tablet Take 1 tablet (25 mg total) by mouth daily. 30 tablet 0   nicotine (NICODERM CQ - DOSED IN MG/24 HR) 7 mg/24hr patch Place 2 patches (14 mg total) onto the skin daily for 120 days, THEN 1 patch (7 mg total) daily. 300 patch 0   potassium chloride SA (KLOR-CON) 20 MEQ tablet Take 2 tablets (40 mEq total) by mouth 2 (two) times daily. 60 tablet 0   simvastatin (ZOCOR) 20 MG tablet Take 1 tablet by mouth once daily 90 tablet 0   Spacer/Aero-Holding Chambers (BREATHERITE COLL SPACER ADULT) MISC 1 applicator by Does not apply route daily. 1 each 0   spironolactone (ALDACTONE) 25 MG tablet Take 1 tablet (25 mg total) by mouth daily. 30 tablet 6   STIOLTO RESPIMAT 2.5-2.5 MCG/ACT AERS INHALE 2 PUFFS BY MOUTH ONCE DAILY 4 g 0   warfarin (COUMADIN) 5 MG tablet Take 1 tablet (5 mg total) by mouth one time only at 4 PM. 30 tablet 2   No current facility-administered medications on file prior to visit.    Family History  Problem Relation Age of Onset   Heart attack Other    Breast cancer Maternal Aunt 28    Social History  Socioeconomic History   Marital status: Single    Spouse name: Not on file   Number of children: Not on file   Years of education: Not on file   Highest education level: Not on file  Occupational History   Not on file  Tobacco Use   Smoking status: Every Day    Packs/day: 0.10    Years: 30.00    Pack years: 3.00    Types: Cigarettes   Smokeless tobacco: Never   Tobacco comments:    Sometimes less. 2 per day  Vaping Use   Vaping Use: Never used  Substance and Sexual Activity   Alcohol use: Yes    Comment: beer- ocasional   Drug use: No   Sexual activity: Not Currently    Birth control/protection: None  Other Topics Concern   Not on file  Social History Narrative   ** Merged History Encounter **       Social Determinants of Health   Financial Resource  Strain: Medium Risk   Difficulty of Paying Living Expenses: Somewhat hard  Food Insecurity: Landscape architect Present   Worried About Charity fundraiser in the Last Year: Never true   Ran Out of Food in the Last Year: Sometimes true  Transportation Needs: No Transportation Needs   Lack of Transportation (Medical): No   Lack of Transportation (Non-Medical): No  Physical Activity: Not on file  Stress: Not on file  Social Connections: Not on file  Intimate Partner Violence: Not on file    Review of Systems: ROS negative except for what is noted on the assessment and plan.  Vitals:   01/27/21 0921  BP: 118/85  Pulse: 73  Temp: 98.6 F (32 C)  TempSrc: Oral  SpO2: 98%  Weight: 176 lb 12.8 oz (80.2 kg)  Height: '5\' 5"'$  (1.651 m)    Physical Exam: Gen: A&O x3 and in no apparent distress, well appearing and nourished. HEENT: Head - normocephalic, atraumatic. Eye -  visual acuity grossly intact, conjunctiva clear, sclera non-icteric, EOM intact. Mouth - No obvious caries or periodontal disease. Neck: no obvious masses or nodules, AROM intact. CV: RRR, no murmurs, rubs, or gallops. S1/S2 presents  Resp: Clear to ascultation bilaterally  Abd: BS (+) x4, soft, non-tender, without obvious hepatosplenomegaly or masses MSK: Grossly normal AROM and strength x4 extremities. Skin: good skin turgor, no rashes, unusual bruising, or prominent lesions.  Neuro: No focal deficits, grossly normal sensation and coordination.  Psych: Oriented x3 and responding appropriately. Intact recent and remote memory, normal mood, judgement, affect , and insight.    Assessment & Plan:   See Encounters Tab for problem based charting.  Patient discussed with Dr. Kelle Darting, D.O. [facility], PGY-3 Pager: [phone], Phone: [phone] Date 01/27/2021 Time 11:43 AM

## 2021-01-27 NOTE — Progress Notes (Signed)
Anticoagulation Management Kim Anthony is a 70 y.o. female who reports to the clinic for monitoring of warfarin treatment.    Indication: PE , history of; long term current use of warfarin to maintain INR 2.0 - 3.0.  Duration: indefinite Supervising physician:  Velna Ochs, MD  Anticoagulation Clinic Visit History: Patient does not report signs/symptoms of bleeding or thromboembolism  Other recent changes: No diet, medications, lifestyle changes endorsed.  Anticoagulation Episode Summary     Current INR goal:  2.5-3.5  TTR:  28.3 % (5 y)  Next INR check:  02/17/2021  INR from last check:  2.6 (01/27/2021)  Weekly max warfarin dose:    Target end date:    INR check location:  Anticoagulation Clinic  Preferred lab:    Send INR reminders to:     Indications   Pulmonary embolism (HCC) [I26.99]        Comments:           Allergies  Allergen Reactions   Lisinopril Swelling   Acetaminophen-Codeine Itching   Propoxyphene Itching    Darvocet   Tape Rash    Plastic   Tramadol Itching and Nausea And Vomiting    Current Outpatient Medications:    albuterol (PROVENTIL) (2.5 MG/3ML) 0.083% nebulizer solution, USE 1 VIAL IN NEBULIZER EVERY 6 HOURS AS NEEDED FOR WHEEZING FOR SHORTNESS OF BREATH, Disp: 90 mL, Rfl: 2   albuterol (VENTOLIN HFA) 108 (90 Base) MCG/ACT inhaler, Inhale 1-2 puffs into the lungs every 6 (six) hours as needed for wheezing or shortness of breath., Disp: 6.7 g, Rfl: 11   aspirin 81 MG chewable tablet, Chew 1 tablet (81 mg total) by mouth daily., Disp: 30 tablet, Rfl: 0   dapagliflozin propanediol (FARXIGA) 10 MG TABS tablet, Take 1 tablet (10 mg total) by mouth daily., Disp: 30 tablet, Rfl: 0   fluticasone (FLONASE) 50 MCG/ACT nasal spray, Place 1 spray into both nostrils daily as needed for allergies., Disp: 16 each, Rfl: 11   furosemide (LASIX) 40 MG tablet, Take 1 tablet (40 mg total) by mouth daily., Disp: 30 tablet, Rfl: 2   gabapentin (NEURONTIN) 600  MG tablet, Take 1.5 tablets (900 mg total) by mouth 3 (three) times daily. For nerve pain- increased to get her off lyrica, Disp: 150 tablet, Rfl: 5   hydrALAZINE (APRESOLINE) 25 MG tablet, Take 0.5 tablets (12.5 mg total) by mouth every 8 (eight) hours., Disp: 30 tablet, Rfl: 0   isosorbide mononitrate (IMDUR) 30 MG 24 hr tablet, Take 1 tablet (30 mg total) by mouth daily., Disp: 30 tablet, Rfl: 0   metoprolol succinate (TOPROL-XL) 25 MG 24 hr tablet, Take 1 tablet (25 mg total) by mouth daily., Disp: 30 tablet, Rfl: 0   nicotine (NICODERM CQ - DOSED IN MG/24 HR) 7 mg/24hr patch, Place 2 patches (14 mg total) onto the skin daily for 120 days, THEN 1 patch (7 mg total) daily., Disp: 300 patch, Rfl: 0   potassium chloride SA (KLOR-CON) 20 MEQ tablet, Take 2 tablets (40 mEq total) by mouth 2 (two) times daily., Disp: 60 tablet, Rfl: 0   simvastatin (ZOCOR) 20 MG tablet, Take 1 tablet by mouth once daily, Disp: 90 tablet, Rfl: 0   Spacer/Aero-Holding Chambers (BREATHERITE COLL SPACER ADULT) MISC, 1 applicator by Does not apply route daily., Disp: 1 each, Rfl: 0   spironolactone (ALDACTONE) 25 MG tablet, Take 1 tablet (25 mg total) by mouth daily., Disp: 30 tablet, Rfl: 6   STIOLTO RESPIMAT 2.5-2.5 MCG/ACT AERS, INHALE  2 PUFFS BY MOUTH ONCE DAILY, Disp: 4 g, Rfl: 0   warfarin (COUMADIN) 5 MG tablet, Take 1 tablet (5 mg total) by mouth one time only at 4 PM., Disp: 30 tablet, Rfl: 2   enoxaparin (LOVENOX) 80 MG/0.8ML injection, Inject 0.8 mLs (80 mg total) into the skin every 12 (twelve) hours for 5 days. CONTINUE using the injections until your INR is > 2.0, Disp: 8 mL, Rfl: 0 Past Medical History:  Diagnosis Date   Arthritis    Asthma    Breast cancer ([place]) 2001   Left Breast Cancer   CHF (congestive heart failure) (HCC)    COPD (chronic obstructive pulmonary disease) (HCC)    Coronary artery disease    History of mitral valve replacement with mechanical valve    HLD (hyperlipidemia)     Hypertension    Pulmonary embolism (HCC)    Social History   Socioeconomic History   Marital status: Single    Spouse name: Not on file   Number of children: Not on file   Years of education: Not on file   Highest education level: Not on file  Occupational History   Not on file  Tobacco Use   Smoking status: Every Day    Packs/day: 0.10    Years: 30.00    Pack years: 3.00    Types: Cigarettes   Smokeless tobacco: Never   Tobacco comments:    Sometimes less. 2 per day  Vaping Use   Vaping Use: Never used  Substance and Sexual Activity   Alcohol use: Yes    Comment: beer- ocasional   Drug use: No   Sexual activity: Not Currently    Birth control/protection: None  Other Topics Concern   Not on file  Social History Narrative   ** Merged History Encounter **       Social Determinants of Health   Financial Resource Strain: Medium Risk   Difficulty of Paying Living Expenses: Somewhat hard  Food Insecurity: Landscape architect Present   Worried About Charity fundraiser in the Last Year: Never true   Ran Out of Food in the Last Year: Sometimes true  Transportation Needs: No Transportation Needs   Lack of Transportation (Medical): No   Lack of Transportation (Non-Medical): No  Physical Activity: Not on file  Stress: Not on file  Social Connections: Not on file   Family History  Problem Relation Age of Onset   Heart attack Other    Breast cancer Maternal Aunt 50    ASSESSMENT Recent Results: The most recent result is correlated with 42.5 mg per week: Lab Results  Component Value Date   INR 2.6 01/27/2021   INR 1.0 (A) 01/16/2021   INR 1.7 (H) 01/11/2021    Anticoagulation Dosing: Description   Take one-and-one-half of your '5mg'$  peach colored warfarin tablets on Mondays, Wednesdays, Fridays and Saturdays. On Sundays, Tuesdays and Thursdays, take one (1) tablet of your '5mg'$  peach-colored warfarin tablets.      INR today: Therapeutic  PLAN Weekly dose was  increased by 6% to 45 mg per week. Patient, with therapeutic INR may now DISCONTINUE her enoxaparin/Lovenox injections.   Patient Instructions  Patient instructed to take medications as defined in the Anti-coagulation Track section of this encounter.  Patient instructed to take today's dose.  Patient instructed to take one-and-one-half of your '5mg'$  peach colored warfarin tablets on Mondays, Wednesdays, Fridays and Saturdays. On Sundays, Tuesdays and Thursdays, take one (1) tablet of your '5mg'$  peach-colored warfarin  tablets.  Patient verbalized understanding of these instructions.   Patient advised to contact clinic or seek medical attention if signs/symptoms of bleeding or thromboembolism occur.  Patient verbalized understanding by repeating back information and was advised to contact me if further medication-related questions arise. Patient was also provided an information handout.  Follow-up Return in 3 weeks (on 02/17/2021) for Follow up INR.  Kim Anthony  15 minutes spent face-to-face with the patient during the encounter. 50% of time spent on education, including signs/sx bleeding and clotting, as well as food and drug interactions with warfarin. 50% of time was spent on fingerprick POC INR sample collection,processing, results determination, and documentation in [email or web address]

## 2021-01-27 NOTE — Assessment & Plan Note (Signed)
>>  ASSESSMENT AND PLAN FOR HFREF (HEART FAILURE WITH REDUCED EJECTION FRACTION) (HCC) WRITTEN ON 09/14/2023 11:44 AM BY Luanna Weesner, DO   >>ASSESSMENT AND PLAN FOR CHRONIC HFREF (HEART FAILURE WITH REDUCED EJECTION FRACTION) (HCC) WRITTEN ON 09/14/2023 11:43 AM BY Monnie Gudgel, DO   >>ASSESSMENT AND PLAN FOR CHF (CONGESTIVE HEART FAILURE) (HCC) WRITTEN ON 09/14/2023 11:43 AM BY Sophiagrace Benbrook, DO   >>ASSESSMENT AND PLAN FOR BIVENTRICULAR HEART FAILURE WITH REDUCED LEFT VENTRICULAR FUNCTION (HCC) WRITTEN ON 01/27/2021 11:40 AM BY COE, BENJAMIN, MD  Assessment: Patient presents for follow-up from her recent pleural hospital admission for acute on chronic heart failure.  Patient has since followed up with her outpatient heart failure team.  She is tolerating her guideline directed heart failure medications well.  Currently she is taking furosemide 40 mg daily, spironolactone 25 mg daily, metoprolol 25 mg daily, isosorbide mononitrate 30 mg daily, hydralazine 25 mg daily, and Farxiga 10 mg daily.  Patient states that she is breathing much better and has better exercise tolerance.  She denies any signs or symptoms of hypervolemia on exam today.  She is currently 176 pounds and at her dry weight.  She states that she is trying to maintain salt and fluid restrictions as best as possible.   Plan: -Continue guideline directed heart failure medications -2 g salt restriction and 1500 cc fluid restriction as possible -Daily weights -We will check kidney function electrolytes today.

## 2021-01-27 NOTE — Assessment & Plan Note (Signed)
>>  ASSESSMENT AND PLAN FOR CHF (CONGESTIVE HEART FAILURE) (HCC) WRITTEN ON 09/14/2023 11:43 AM BY Cristofher Livecchi, DO   >>ASSESSMENT AND PLAN FOR BIVENTRICULAR HEART FAILURE WITH REDUCED LEFT VENTRICULAR FUNCTION (HCC) WRITTEN ON 01/27/2021 11:40 AM BY COE, BENJAMIN, MD  Assessment: Patient presents for follow-up from her recent pleural hospital admission for acute on chronic heart failure.  Patient has since followed up with her outpatient heart failure team.  She is tolerating her guideline directed heart failure medications well.  Currently she is taking furosemide 40 mg daily, spironolactone 25 mg daily, metoprolol 25 mg daily, isosorbide mononitrate 30 mg daily, hydralazine 25 mg daily, and Farxiga 10 mg daily.  Patient states that she is breathing much better and has better exercise tolerance.  She denies any signs or symptoms of hypervolemia on exam today.  She is currently 176 pounds and at her dry weight.  She states that she is trying to maintain salt and fluid restrictions as best as possible.   Plan: -Continue guideline directed heart failure medications -2 g salt restriction and 1500 cc fluid restriction as possible -Daily weights -We will check kidney function electrolytes today.

## 2021-01-28 NOTE — Progress Notes (Signed)
INTERNAL MEDICINE TEACHING ATTENDING ADDENDUM   I agree with pharmacy recommendations as outlined in their note.   Latrelle Bazar, MD  

## 2021-01-28 NOTE — Progress Notes (Signed)
Internal Medicine Clinic Attending  Case discussed with Dr. Marianna Payment  At the time of the visit.  We reviewed the resident's history and exam and pertinent patient test results.  I agree with the assessment, diagnosis, and plan of care documented in the resident's note.   Correction from today's visit: Temperature was 98.6 F, not 89.6 as recorded.

## 2021-01-28 NOTE — Addendum Note (Signed)
Addended by: Jodean Lima on: 01/28/2021 01:03 PM   Modules accepted: Level of Service

## 2021-01-29 ENCOUNTER — Ambulatory Visit: Payer: Medicare Other

## 2021-01-29 ENCOUNTER — Other Ambulatory Visit: Payer: Self-pay | Admitting: Student

## 2021-01-29 NOTE — Patient Instructions (Signed)
Visit Information   Goals Addressed               This Visit's Progress     Cope with Pain in right wrist        Timeframe:  Short-Term Goal Priority:  High Start Date:      06/20/20                       Expected End Date:   ongoing                  Follow Up Date 03/21/21   - keep all provider appointments  - use distraction techniques - use relaxation during pain   -wear right wrist brace - contact provider for unrelieved severe pain Why is this important?   Living with back pain and enjoying your life may be hard.  Feelings, such as depression or anger, can make your pain worse.  Learning ways to cope may help you find some relief from the pain.    Notes:       Track and Manage My Symptoms-COPD- "I know when to use my nebulizer machine." (pt-stated)        Timeframe:  Long-Range Goal Priority:  High Start Date:          06/20/20                   Expected End Date:   ongoing                    Follow Up Date 03/21/21   - eliminate symptom triggers at home - follow rescue plan if symptoms flare-up - keep follow-up appointments - use an extra pillow to sleep if needed   Why is this important?   Tracking your symptoms and other information about your health helps your doctor plan your care.  Write down the symptoms, the time of day, what you were doing and what medicine you are taking.  You will soon learn how to manage your symptoms.     Notes:         The patient verbalized understanding of instructions, educational materials, and care plan provided today and declined offer to receive copy of patient instructions, educational materials, and care plan.   Telephone follow up appointment with care management team member scheduled for: 02/26/21'@10'$ :[place], RN, BSN, CCM Care Management Coordinator [facility] Phone: [phone] / Fax: [phone]

## 2021-01-29 NOTE — Chronic Care Management (AMB) (Signed)
Care Management    RN Visit Note  01/29/2021 Name: Kim Anthony [name]: [id number] DOB: 01-16-1951  Subjective: Kim Anthony is a 70 y.o. year old female who is a primary care patient of Kim Payment, MD. The care management team was consulted for assistance with disease management and care coordination needs.    Engaged with patient by telephone for follow up visit in response to provider referral for case management and/or care coordination services.   Consent to Services:   [name] was given information about Care Management services today including:  Care Management services includes personalized support from designated clinical staff supervised by her physician, including individualized plan of care and coordination with other care providers 24/7 contact phone numbers for assistance for urgent and routine care needs. The patient may stop case management services at any time by phone call to the office staff.  Patient agreed to services and consent obtained.   Assessment: Review of patient past medical history, allergies, medications, health status, including review of consultants reports, laboratory and other test data, was performed as part of comprehensive evaluation and provision of chronic care management services.   SDOH (Social Determinants of Health) assessments and interventions performed:    Care Plan  Allergies  Allergen Reactions   Lisinopril Swelling   Acetaminophen-Codeine Itching   Propoxyphene Itching    Darvocet   Tape Rash    Plastic   Tramadol Itching and Nausea And Vomiting    Outpatient Encounter Medications as of 01/29/2021  Medication Sig   albuterol (PROVENTIL) (2.5 MG/3ML) 0.083% nebulizer solution USE 1 VIAL IN NEBULIZER EVERY 6 HOURS AS NEEDED FOR WHEEZING FOR SHORTNESS OF BREATH   albuterol (VENTOLIN HFA) 108 (90 Base) MCG/ACT inhaler Inhale 1-2 puffs into the lungs every 6 (six) hours as needed for wheezing or shortness of breath.   aspirin 81 MG  chewable tablet Chew 1 tablet (81 mg total) by mouth daily.   dapagliflozin propanediol (FARXIGA) 10 MG TABS tablet Take 1 tablet (10 mg total) by mouth daily.   enoxaparin (LOVENOX) 80 MG/0.8ML injection Inject 0.8 mLs (80 mg total) into the skin every 12 (twelve) hours for 5 days. CONTINUE using the injections until your INR is > 2.0   fluticasone (FLONASE) 50 MCG/ACT nasal spray Place 1 spray into both nostrils daily as needed for allergies.   furosemide (LASIX) 40 MG tablet Take 1 tablet (40 mg total) by mouth daily.   gabapentin (NEURONTIN) 600 MG tablet Take 1.5 tablets (900 mg total) by mouth 3 (three) times daily. For nerve pain- increased to get her off lyrica   hydrALAZINE (APRESOLINE) 25 MG tablet Take 0.5 tablets (12.5 mg total) by mouth every 8 (eight) hours.   isosorbide mononitrate (IMDUR) 30 MG 24 hr tablet Take 1 tablet (30 mg total) by mouth daily.   metoprolol succinate (TOPROL-XL) 25 MG 24 hr tablet Take 1 tablet (25 mg total) by mouth daily.   nicotine (NICODERM CQ - DOSED IN MG/24 HR) 7 mg/24hr patch Place 2 patches (14 mg total) onto the skin daily for 120 days, THEN 1 patch (7 mg total) daily.   potassium chloride SA (KLOR-CON) 20 MEQ tablet Take 2 tablets (40 mEq total) by mouth daily.   simvastatin (ZOCOR) 20 MG tablet Take 1 tablet by mouth once daily   Spacer/Aero-Holding Chambers (BREATHERITE COLL SPACER ADULT) MISC 1 applicator by Does not apply route daily.   spironolactone (ALDACTONE) 25 MG tablet Take 1 tablet (25 mg total) by mouth daily.  STIOLTO RESPIMAT 2.5-2.5 MCG/ACT AERS INHALE 2 PUFFS BY MOUTH ONCE DAILY   warfarin (COUMADIN) 5 MG tablet Take 1 tablet (5 mg total) by mouth one time only at 4 PM.   No facility-administered encounter medications on file as of 01/29/2021.    Patient Active Problem List   Diagnosis Date Noted   Elevated transaminase level 01/27/2021   Respiratory distress    Dyspnea 01/05/2021   Acute idiopathic gout of multiple sites  09/30/2020   Eye redness 02/15/2020   Chronic pain syndrome 01/17/2020   Monoarthritis of wrist 10/18/2019   Ganglion cyst 09/21/2019   Hip pain 08/23/2019   Thoracic ascending aortic aneurysm ([place]) 07/05/2019   Pustular rash 03/09/2019   Anemia 02/16/2019   Breast cancer ([place])    Acquired absence of left breast 12/20/2018   Open wound of axillary region 05/11/2018   COPD (chronic obstructive pulmonary disease) ([place]) 11/25/2017   S/P MVR (mitral valve replacement)    Chronic anticoagulation    Tobacco use disorder 02/25/2017   Lymphadenopathy, mediastinal 02/25/2017   History of mitral valve replacement with mechanical valve 11/18/2016   Essential hypertension 02/01/2016   Allergic rhinitis 02/01/2016   Healthcare maintenance 01/30/2016   Chronic low back pain 07/22/2015   Pulmonary embolism ([place]) 07/11/2015   Chronic combined systolic and diastolic heart failure ([place]) 07/11/2015   Headache, common migraine 07/11/2015   HLD (hyperlipidemia) 07/11/2015    Conditions to be addressed/monitored: CHF and COPD  Care Plan : Chronic Pain (Adult)  Updates made by [name], RN since 01/29/2021 12:00 AM     Problem: Chronic Pain Management (Chronic Pain)      Goal: Chronic Pain Managed- acute on chronic right wrist pain is managed   Start Date: 06/20/2020  Recent Progress: On track  Priority: High  Note:   CARE PLAN ENTRY (see longitudinal plan of care for additional care plan information)  Current Barriers:  Knowledge Deficits related to managing acute/chronic pain- Successful outreach to patient via phone. Pain notes her pain level as 8/10 and she has taken tylenol. Patient stated she went to the store this morning and that is when her back and wrist ache after activities. Chronic Disease Management support and education needs related to chronic pain  Nurse Case Manager Clinical Goal(s):  Over the next 30-60 days, patient will verbalize understanding of plan for  managing pain Over the next 30-60  days, patient will attend all scheduled medical appointments:   Interventions:  Evaluation of current treatment plan related to right wrist pain and patient's adherence to plan as established by provider. Advised patient to keep all scheduled appointments including upcoming MR wrist on 02/04/21. Referred to community care guide for assistance in securing personal emergency response resources Discussed plans with patient for ongoing care management follow up and provided patient with direct contact information for care management team  Patient Self Care Activities:  Patient verbalizes understanding of plan to work with providers in treating right wrist pain Self administers medications as prescribed Attends all scheduled provider appointments Calls pharmacy for medication refills Calls provider office for new concerns or questions  keep all provider appointments  - use distraction techniques - use relaxation during pain   -wear right wrist brace - contact provider for unrelieved severe pain Follow up Plan: The care management team will reach out to the patient again over the next 30-60 days.     Care Plan : COPD (Adult)  Updates made by [name], RN since 01/29/2021 12:00  AM     Problem: Symptom Exacerbation (COPD)- call provider when symptoms not improving despite   Priority: High     Long-Range Goal: Symptom Exacerbation Prevented or Minimized to prevent hospitalization or need for emergency treatment   Start Date: 06/20/2020  Recent Progress: On track  Priority: High  Note:   Current Barriers:  Knowledge deficits related to basic COPD self care/management- Patient noted her breathing is doing well today and she did have to use her nebulizer yesterday.   Case Manager Clinical Goal(s): Over the next 30-60 days, patient will not be hospitalized for COPD exacerbation  Interventions:  Advised patient to self assesses COPD action plan  zone and make appointment with provider if in the yellow zone for 48 hours without improvement. Provided education about and advised patient to utilize infection prevention strategies to reduce risk of respiratory infection  09/16/20- Advised patient that provider sent the Rx for portable nebulizer sent to [place] on [facility] or per Skeet Latch, [employer] representative, portable nebulizers are now in stock at [place] store in [facility] and patient could purchase one there 11/12/20- advised patient to call this CCM RN with the phone number of the portable nebulizer company when she sees the TV ad 12/10/20- Spoke with [place] at [place] at [phone]; she states they provide the portable machine and the nebulizer medications, says patient should have no out of pocket cost for the portable nebulizer if she has had her current nebulizer for > 5 years 12/10/20- attempted to reach patient via mobile number to ask about age of home nebulizer, unable to leave message as voice mailbox not set up  12/10/20- Community message sent to [employer] Representatives asking if patient's current nebulizer can be adapted to use in car and if not, the age of her current nebulizer 12/17/20- Verified that patient received portable nebulizer and that she has used it in the car successfully  Patient Goals/Self-Care Activities:  eliminate symptom triggers at home - follow rescue plan if symptoms flare-up - keep follow-up appointments - use an extra pillow to sleep if needed Follow Up Plan: The care management team will reach out to the patient again over the next 30-60 days.      Care Plan : CCM RN Hypertension (Adult)  Updates made by [name], RN since 01/29/2021 12:00 AM     Problem: Hypertension (Hypertension)      Long-Range Goal: Hypertension Monitored   Recent Progress: On track  Priority: High  Note:   Objective: Patient noted she has not been doing her daily weights, she was  176 on 01/27/21 but she told this RNCM that she is going to weigh  herself today.  Discussed the importance of daily weights and her 2gr. Salt restriction.  Patient asked if she can get a prescription for the nicotine patches they gave her in the hospital. She shared that upon dc from acute they were suppose to call in to [facility], but they have not.  Plan to send message to provider. Last practice recorded BP readings:  BP Readings from Last 3 Encounters:  01/27/21 118/85  01/21/21 139/70  01/11/21 (!) 157/90   Most recent eGFR/CrCl:  Lab Results  Component Value Date   EGFR 57 (L) 01/27/2021    No components found for: CRCL Current Barriers:  Knowledge Deficits related to basic understanding of hypertension pathophysiology and self care management Unable to independently self educate on HTN self management to meet treatment targets  Case Manager Clinical Goal(s):  patient will verbalize understanding of plan for hypertension management patient will attend all scheduled medical appointments: including all follow up clinic appointments patient will demonstrate improved adherence to prescribed treatment plan for hypertension as evidenced by taking all medications as prescribed, monitoring and recording blood pressure as directed, adhering to low sodium/DASH diet patient will demonstrate improved health management independence as evidenced by checking blood pressure as directed and notifying PCP if SBP>140 or DBP > 90,  taking all medications as prescribe, and adhering to a low sodium diet as discussed. Interventions:  Collaboration with Kim Payment, MD regarding development and update of comprehensive plan of care as evidenced by provider attestation and co-signature Inter-disciplinary care team collaboration (see longitudinal plan of care)- Messaged provider regarding patients request for prescription for nicotine patches. Evaluation of current treatment plan related to hypertension self  management and patient's adherence to plan as established by provider. Provided education to patient re: stroke prevention, s/s of heart attack and stroke, DASH diet, complications of uncontrolled blood pressure Reviewed medications with patient and discussed importance of compliance Encouraged use of home blood pressure monitoring daily and recording on blood pressure log sheet in [facility] spiral bound notebook/day planner; include symptoms of hypotension or potential medication side effects in log.  Reviewed blood pressure treatment targets  Reviewed scheduled/upcoming provider appointments including: MR for right wrist on 02/04/21 Self-Care Activities: - Self administers medications as prescribed Attends all scheduled provider appointments Calls provider office for new concerns, questions, or BP outside discussed parameters Checks BP and records as discussed Follows a low sodium diet/DASH diet Patient Goals: - check blood pressure weekly - ask questions to understand - learn about high blood pressure  Follow Up Plan: The care management team will reach out to the patient again over the next 30 days.                Plan: Telephone follow up appointment with care management team member scheduled for:  34 days  [name], RN, BSN, CCM Care Management Coordinator [facility] Internal Medicine Phone: [phone] / Fax: [phone]

## 2021-01-29 NOTE — Progress Notes (Signed)
PCP: Sid Falcon, MD HF Cardiologist: Dr. Haroldine Laws  HPI:  Kim Anthony is a 70 y.o. female with a history of mechanical MVR in 2001 in [place], [place], hx breast cancer s/p left mastectomy and radiation/chemo, tobacco use, COPD, prior PE/DVT, HTN, undergoing workup for possible inflammatory arthritis with Rheumatology, prior CABG (she is unaware of prior CAD), and systolic CHF.   EF (2017) 45-50% (no EF on file prior to this). Had improved to 55-60% on Echo in July 2020.   She presented to [facility] 01/[facility]/21 with A/C CHF exacerbation and likely COPD exacerbation. Received steroids, bronchodilators, and 40 mg furosemide IV. She was admitted for management of COPD exacerbation and acute on chronic diastolic CHF. Echo with EF of 35-40%, previously 55-60% on echo July 2020. No significant MR. Mildly enlarged RV. Etiology for decline in EF was not certain so she was scheduled for [facility] to rule out obstructive CAD. Once INR was acceptable, she underwent R/LHC showing 1v CAD with occluded mRCA and stable hemodynamics. IV furosemide was transitioned to PO 80 mg BID, and she was started on GDMT; no ACEi or ARNi due to history of angioedema. Hospitalization c/b transaminitis. + Hep B E antigen and low levels of Hep B surface antibody, but Hep B antigen negative. Her atorvastatin was decreased to 40 mg and Hep B DNA pending at discharge. She was bridged with Lovenox until her INR>2.5 and discharged home; discharge weight 177 lbs.    Recently returned to HF Clinic for post hospitalization HF follow up 01/21/21. Overall was feeling fine. Denied increasing SOB, CP, dizziness, edema, or PND/Orthopnea. Appetite was ok. No fever or chills. Weight at home was 171-174 pounds. Reported taking all medications. Noted she drinks 16 oz beer 3x/week.  Today she returns to HF clinic for pharmacist medication titration. At last visit with APP,  furosemide was decreased to 40 mg daily and spironolactone was increased to 25 mg daily.  Potassium chloride was decreased to 20 mEq daily on 01/21/21 but patient was unable to be reached and this change never occurred. Overall patient is feeling well today. No dizziness, lightheadedness, chest pain or palpitations. No SOB/DOE. Patient was excited to report that she is no longer SOB when going up stairs or when grocery shopping.  Weight at home stable at 174-176 lbs. She takes furosemide 40 mg daily and only takes extra PRN furosemide ~3x/month. No LEE, PND or orthopnea. Taking all medications as prescribed and tolerating all medications.    HF Medications: Metoprolol succinate 25 mg daily Spironolactone 25 mg daily Farxiga 10 mg daily Hydralazine 12.5 mg TID Imdur 30 mg daily Furosemide 40 mg daily Potassium chloride 40 mEq daily  Has the patient been experiencing any side effects to the medications prescribed?  no  Does the patient have any problems obtaining medications due to transportation or finances?   No - has [facility] Medicare/Medicaid  Understanding of regimen: good Understanding of indications: good Potential of compliance: good Patient understands to avoid NSAIDs. Patient understands to avoid decongestants.    Pertinent Lab Values: 02/03/21: Serum creatinine 1.01, BUN 27, Potassium 4.4, Sodium 134  Vital Signs: Weight: 176 lbs (last clinic weight: 176.8 lbs) Blood pressure: 144/78 - did not take morning medications  Heart rate: 97   Assessment/Plan: Chronic combined CHF: - Echo (2017): EF 45-50% in setting of PE. Has mild to moderate RV dysfunction. MV prosthesis okay. - Echo (12/2020): EF 35-40% (Dr. Haroldine Laws felt EF 45%), previously 55-60% in 2020. - R/LHC (01/09/21): with 1v  CAD mRCA chronically occluded and well compensated hemodynamics. - NYHA II, euvolemic on exam. - Continue furosemide 40 mg daily. Decrease potassium chloride to 20 mEq daily.  - Continue metoprolol XL 25 mg daily. - Continue spironolactone 25 mg daily. - Increase hydralazine to 25 mg TID  + Imdur 30 mg daily. - Continue Farxiga 10 mg daily. - H/o angioedema with ACE-I, so no ARNi either.   2. CAD  - 1 V CAD with occluded mid RCA on cath (12/2020) - No CP. - On ASA + beta blocker + statin. - Switched to simvastatin 20 mg daily due to elevated LFTs.     3. Hx of mechanical mitral valve replacement: - Mechanical MVR in [place] in 2001. - Echo (12/2020) mean gradient of 4 mmHg with no significant MR. - Continue aspirin. - Continue Coumadin   4. Tricuspid valve regurgitation: - Moderate by echo.   5. Hx DVT/PE: - On Coumadin. - INRs followed by Coumadin Clinic.   6. HTN: - Elevated at 144/78 in clinic but did not take morning medications. SBP at home generally 120-125.  - Increase hydralazine as above.    7. COPD - Continue inhalers.   8. Smoker: - Smoking cessation discussed. - Continue nicotine patch.  Return to clinic in 4 weeks with [facility].    Audry Riles, PharmD, BCPS, BCCP, CPP Heart Failure Clinic Pharmacist [phone]

## 2021-02-02 NOTE — Progress Notes (Signed)
INTERNAL MEDICINE TEACHING ATTENDING ADDENDUM - Aldine Contes M.D  Duration- indefinite, Indication- mechanical mitral valve, PE, INR- sub therapeutic. Agree with pharmacy recommendations as outlined in their note.

## 2021-02-03 ENCOUNTER — Other Ambulatory Visit: Payer: Self-pay

## 2021-02-03 ENCOUNTER — Telehealth: Payer: Self-pay | Admitting: *Deleted

## 2021-02-03 ENCOUNTER — Ambulatory Visit (HOSPITAL_COMMUNITY)
Admission: RE | Admit: 2021-02-03 | Discharge: 2021-02-03 | Disposition: A | Discharge status: Home / Self Care | Payer: Medicare Other | Source: Ambulatory Visit | Attending: Cardiology | Admitting: Cardiology

## 2021-02-03 ENCOUNTER — Telehealth: Payer: Self-pay | Admitting: Pharmacist

## 2021-02-03 ENCOUNTER — Emergency Department (HOSPITAL_COMMUNITY)
Admission: EM | Admit: 2021-02-03 | Discharge: 2021-02-03 | Disposition: A | Discharge status: Home / Self Care | Payer: Medicare Other | Attending: Physician Assistant | Admitting: Physician Assistant

## 2021-02-03 DIAGNOSIS — R059 Cough, unspecified: Secondary | ICD-10-CM | POA: Insufficient documentation

## 2021-02-03 DIAGNOSIS — K068 Other specified disorders of gingiva and edentulous alveolar ridge: Secondary | ICD-10-CM | POA: Diagnosis not present

## 2021-02-03 DIAGNOSIS — R7989 Other specified abnormal findings of blood chemistry: Secondary | ICD-10-CM | POA: Insufficient documentation

## 2021-02-03 DIAGNOSIS — Z5321 Procedure and treatment not carried out due to patient leaving prior to being seen by health care provider: Secondary | ICD-10-CM | POA: Insufficient documentation

## 2021-02-03 DIAGNOSIS — R791 Abnormal coagulation profile: Secondary | ICD-10-CM | POA: Insufficient documentation

## 2021-02-03 LAB — CMP14 + ANION GAP
ALT: 36 IU/L — ABNORMAL HIGH (ref 0–32)
AST: 44 IU/L — ABNORMAL HIGH (ref 0–40)
Albumin/Globulin Ratio: 1.4 (ref 1.2–2.2)
Albumin: 4.6 g/dL (ref 3.7–4.7)
Alkaline Phosphatase: 142 IU/L — ABNORMAL HIGH (ref 44–121)
BUN/Creatinine Ratio: 25 (ref 12–28)
BUN: 26 mg/dL (ref 8–27)
Bilirubin Total: 0.5 mg/dL (ref 0.0–1.2)
Calcium: 10 mg/dL (ref 8.7–10.3)
Chloride: 96 mmol/L (ref 96–106)
Creatinine, Ser: 1.04 mg/dL — ABNORMAL HIGH (ref 0.57–1.00)
Globulin, Total: 3.3 g/dL (ref 1.5–4.5)
Glucose: 105 mg/dL — ABNORMAL HIGH (ref 65–99)
Potassium: 5.5 mmol/L — ABNORMAL HIGH (ref 3.5–5.2)
Sodium: 130 mmol/L — ABNORMAL LOW (ref 134–144)
Total Protein: 7.9 g/dL (ref 6.0–8.5)
eGFR: 57 mL/min/{1.73_m2} — ABNORMAL LOW (ref >59)

## 2021-02-03 LAB — CBC WITH DIFFERENTIAL/PLATELET
Abs Immature Granulocytes: 0.04 10*3/uL (ref 0.00–0.07)
Basophils Absolute: 0.1 10*3/uL (ref 0.0–0.1)
Basophils Relative: 1 %
Eosinophils Absolute: 0.3 10*3/uL (ref 0.0–0.5)
Eosinophils Relative: 3 %
HCT: 40.6 % (ref 36.0–46.0)
Hemoglobin: 13.3 g/dL (ref 12.0–15.0)
Immature Granulocytes: 0 %
Lymphocytes Relative: 25 %
Lymphs Abs: 2.8 10*3/uL (ref 0.7–4.0)
MCH: 32.8 pg (ref 26.0–34.0)
MCHC: 32.8 g/dL (ref 30.0–36.0)
MCV: 100 fL (ref 80.0–100.0)
Monocytes Absolute: 1 10*3/uL (ref 0.1–1.0)
Monocytes Relative: 9 %
Neutro Abs: 7.1 10*3/uL (ref 1.7–7.7)
Neutrophils Relative %: 62 %
Platelets: 438 10*3/uL — ABNORMAL HIGH (ref 150–400)
RBC: 4.06 MIL/uL (ref 3.87–5.11)
RDW: 11.9 % (ref 11.5–15.5)
WBC: 11.4 10*3/uL — ABNORMAL HIGH (ref 4.0–10.5)
nRBC: 0 % (ref 0.0–0.2)

## 2021-02-03 LAB — APTT: aPTT: 69 seconds — ABNORMAL HIGH (ref 24–36)

## 2021-02-03 LAB — COMPREHENSIVE METABOLIC PANEL
ALT: 30 U/L (ref 0–44)
ALT: 29 U/L (ref 0–44)
AST: 37 U/L (ref 15–41)
AST: 39 U/L (ref 15–41)
Albumin: 3.7 g/dL (ref 3.5–5.0)
Albumin: 3.9 g/dL (ref 3.5–5.0)
Alkaline Phosphatase: 111 U/L (ref 38–126)
Alkaline Phosphatase: 107 U/L (ref 38–126)
Anion gap: 11 (ref 5–15)
Anion gap: 12 (ref 5–15)
BUN: 28 mg/dL — ABNORMAL HIGH (ref 8–23)
BUN: 27 mg/dL — ABNORMAL HIGH (ref 8–23)
CO2: 21 mmol/L — ABNORMAL LOW (ref 22–32)
CO2: 22 mmol/L (ref 22–32)
Calcium: 9.7 mg/dL (ref 8.9–10.3)
Calcium: 10 mg/dL (ref 8.9–10.3)
Chloride: 99 mmol/L (ref 98–111)
Chloride: 100 mmol/L (ref 98–111)
Creatinine, Ser: 1.06 mg/dL — ABNORMAL HIGH (ref 0.44–1.00)
Creatinine, Ser: 1.01 mg/dL — ABNORMAL HIGH (ref 0.44–1.00)
GFR, Estimated: 56 mL/min — ABNORMAL LOW (ref >60)
GFR, Estimated: 60 mL/min — ABNORMAL LOW (ref >60)
Glucose, Bld: 120 mg/dL — ABNORMAL HIGH (ref 70–99)
Glucose, Bld: 104 mg/dL — ABNORMAL HIGH (ref 70–99)
Potassium: 4.4 mmol/L (ref 3.5–5.1)
Potassium: 4.4 mmol/L (ref 3.5–5.1)
Sodium: 131 mmol/L — ABNORMAL LOW (ref 135–145)
Sodium: 134 mmol/L — ABNORMAL LOW (ref 135–145)
Total Bilirubin: 1 mg/dL (ref 0.3–1.2)
Total Bilirubin: 0.7 mg/dL (ref 0.3–1.2)
Total Protein: 7.9 g/dL (ref 6.5–8.1)
Total Protein: 8.3 g/dL — ABNORMAL HIGH (ref 6.5–8.1)

## 2021-02-03 LAB — HEPATITIS B E ANTIGEN: Hep B E Ag: NEGATIVE

## 2021-02-03 LAB — HEPATITIS B SURFACE ANTIGEN: Hepatitis B Surface Ag: NEGATIVE

## 2021-02-03 LAB — HEPATITIS B SURFACE ANTIBODY,QUALITATIVE: Hep B Surface Ab, Qual: NONREACTIVE

## 2021-02-03 LAB — HBV CORE AB, IGG/IGM DIFF
Hep B C IgM: NEGATIVE
Hep B Core Total Ab: NEGATIVE

## 2021-02-03 LAB — HEPATITIS B E ANTIBODY: Hep B E Ab: NEGATIVE

## 2021-02-03 LAB — PROTIME-INR
INR: 5.3 (ref 0.8–1.2)
Prothrombin Time: 48.3 seconds — ABNORMAL HIGH (ref 11.4–15.2)

## 2021-02-03 NOTE — Telephone Encounter (Signed)
Patient called in stating she takes blood thinners and has been "spitting up a lot of blood" this AM. Patient advised to head directly to ED. She agrees.

## 2021-02-03 NOTE — Telephone Encounter (Signed)
I agree

## 2021-02-03 NOTE — ED Notes (Signed)
Pt stated her mouth is no longer bleeding after the tea bag experience so she is leaving

## 2021-02-03 NOTE — ED Triage Notes (Signed)
Pt reports bleeding from mouth/coughing up blood since this morning. Denies injury or recent dental procedures. Takes Coumadin.

## 2021-02-03 NOTE — ED Provider Notes (Signed)
Emergency Medicine Provider Triage Evaluation Note  Kim Anthony , a 70 y.o. female  was evaluated in triage.  Pt complains of bleeding from gums that started today. In warfarin..  Review of Systems  Positive: Bleeding in mouth Negative: Hematuria, bloody stools, headache  Physical Exam  There were no vitals taken for this visit. Gen:   Awake, no distress   Resp:  Normal effort  MSK:   Moves extremities without difficulty Other:  Blood oozing from multiple places in the mouth  Medical Decision Making  Medically screening exam initiated at 12:40 PM.  Appropriate orders placed.  Kim Anthony was informed that the remainder of the evaluation will be completed by another provider, this initial triage assessment does not replace that evaluation, and the importance of remaining in the ED until their evaluation is complete.     Rodney Booze, PA-C 02/03/21 1248    Lorelle Gibbs, DO 02/05/21 1947

## 2021-02-03 NOTE — Telephone Encounter (Signed)
Went to the ED and discussed with Triage Nurse, Richarda Blade, RN. Patient sees me in the Anticoagulation Management Clinic of the Internal Medicine Center. Daughter had called me stating her mom was in the ED with "bleeding from her tongue." I took a Lipton's Tea Bag for placement upon the dorsum of the tongue at area of the terminal sulcus. Upon visualization, it appeared at the time that the bleeding had subsided. I reviewed with the Triage RN the patient's laboratory collected and reported INR of 5.3.  I provided instructions for her warfarin (OMIT today and tomorrow's doses) and recommence on Wednesday 17-AUG-22 as shown on the instructions provided to the Triage RN. These are suggestions that may be overridden by the ED Provider upon their exam and review of laboratory data.

## 2021-02-04 ENCOUNTER — Other Ambulatory Visit: Payer: Self-pay | Admitting: Internal Medicine

## 2021-02-04 ENCOUNTER — Ambulatory Visit
Admission: RE | Admit: 2021-02-04 | Discharge: 2021-02-04 | Disposition: A | Discharge status: Home / Self Care | Payer: Medicare Other | Source: Ambulatory Visit | Attending: Physician Assistant | Admitting: Physician Assistant

## 2021-02-04 DIAGNOSIS — S63071A Subluxation of distal end of right ulna, initial encounter: Secondary | ICD-10-CM | POA: Diagnosis not present

## 2021-02-04 DIAGNOSIS — M65841 Other synovitis and tenosynovitis, right hand: Secondary | ICD-10-CM | POA: Diagnosis not present

## 2021-02-04 DIAGNOSIS — M25431 Effusion, right wrist: Secondary | ICD-10-CM | POA: Diagnosis not present

## 2021-02-04 DIAGNOSIS — M25531 Pain in right wrist: Secondary | ICD-10-CM

## 2021-02-04 MED ORDER — ASPIRIN 81 MG PO CHEW: 81.0000 mg | CHEWABLE_TABLET | Freq: Every day | ORAL | 0 refills | Status: DC

## 2021-02-05 ENCOUNTER — Other Ambulatory Visit: Payer: Self-pay | Admitting: Internal Medicine

## 2021-02-05 DIAGNOSIS — J449 Chronic obstructive pulmonary disease, unspecified: Secondary | ICD-10-CM

## 2021-02-06 ENCOUNTER — Telehealth: Payer: Self-pay

## 2021-02-06 NOTE — Telephone Encounter (Signed)
Kim Anthony called for her Hydrocodone 5/325 refill. Dr. Dagoberto Ligas & Zella Ball are  out of the office. Please advise.   Last PMP fills were. Please check PMP   Oxycodone 5/325 fill by [name] #[id number] on 01/16/2021 Hydrocodone 5/325 filled by Dr. Dagoberto Ligas  # 150 on  01/09/2021

## 2021-02-07 NOTE — Telephone Encounter (Signed)
I spoke with Kim Anthony and she had an issue with her hand being swollen and painful and went to [facility] for treatment and they gave her the oxycodone acetaminophen. She told them about Dr Dagoberto Ligas and was told they would let her know. I am not able to see if there was any communication between the PA and Dr Dagoberto Ligas.  She says she has enough medication to get through til Monday when Dr Dagoberto Ligas is back.

## 2021-02-07 NOTE — Telephone Encounter (Signed)
Call placed to [facility] at [place]. States Kim Anthony has the message and will call back as soon as she has a break between patients. She will advise her that [facility] will close at 3:30 today.

## 2021-02-07 NOTE — Telephone Encounter (Addendum)
Received call from [facility]. States their Sales Rep Leatrice Jewels) has been out of office and he is the one to handle this. She will get in touch with him first thing Monday to fax form to reactivate acct. Patient notified and is very Patent attorney. She states she is doing much better. Denies any abnormal bleeding. States she was supposed to come to clinic today for INR but forgot. She will come in on Monday 8/22. States she still has all the equipment for home INR testing.

## 2021-02-07 NOTE — Telephone Encounter (Addendum)
[employer] Rep Leatrice Jewels) has been out of office. Call placed to [facility] at [place]. She states she will have Field Rep call back as soon as possible so patient can be re-enrolled in [place].

## 2021-02-08 NOTE — Telephone Encounter (Signed)
Thank you for the update!

## 2021-02-10 ENCOUNTER — Telehealth: Payer: Self-pay

## 2021-02-10 ENCOUNTER — Ambulatory Visit: Payer: Medicare Other

## 2021-02-10 MED ORDER — HYDROCODONE-ACETAMINOPHEN 5-325 MG PO TABS: 1.0000 | ORAL_TABLET | ORAL | 0 refills | Status: DC | PRN

## 2021-02-10 NOTE — Telephone Encounter (Signed)
Refill request for Hydrocodone.  

## 2021-02-13 ENCOUNTER — Other Ambulatory Visit: Payer: Self-pay

## 2021-02-13 ENCOUNTER — Ambulatory Visit (HOSPITAL_COMMUNITY)
Admission: RE | Admit: 2021-02-13 | Discharge: 2021-02-13 | Disposition: A | Discharge status: Home / Self Care | Payer: Medicare Other | Source: Ambulatory Visit | Attending: Cardiology | Admitting: Cardiology

## 2021-02-13 DIAGNOSIS — I2582 Chronic total occlusion of coronary artery: Secondary | ICD-10-CM | POA: Diagnosis not present

## 2021-02-13 DIAGNOSIS — Z79899 Other long term (current) drug therapy: Secondary | ICD-10-CM | POA: Diagnosis not present

## 2021-02-13 DIAGNOSIS — I5042 Chronic combined systolic (congestive) and diastolic (congestive) heart failure: Secondary | ICD-10-CM | POA: Diagnosis not present

## 2021-02-13 DIAGNOSIS — Z716 Tobacco abuse counseling: Secondary | ICD-10-CM | POA: Insufficient documentation

## 2021-02-13 DIAGNOSIS — I110 Hypertensive heart disease with heart failure: Secondary | ICD-10-CM | POA: Insufficient documentation

## 2021-02-13 DIAGNOSIS — Z7982 Long term (current) use of aspirin: Secondary | ICD-10-CM | POA: Diagnosis not present

## 2021-02-13 DIAGNOSIS — Z853 Personal history of malignant neoplasm of breast: Secondary | ICD-10-CM | POA: Insufficient documentation

## 2021-02-13 DIAGNOSIS — E876 Hypokalemia: Secondary | ICD-10-CM

## 2021-02-13 DIAGNOSIS — Z923 Personal history of irradiation: Secondary | ICD-10-CM | POA: Diagnosis not present

## 2021-02-13 DIAGNOSIS — Z9012 Acquired absence of left breast and nipple: Secondary | ICD-10-CM | POA: Insufficient documentation

## 2021-02-13 DIAGNOSIS — Z951 Presence of aortocoronary bypass graft: Secondary | ICD-10-CM | POA: Diagnosis not present

## 2021-02-13 DIAGNOSIS — I2510 Atherosclerotic heart disease of native coronary artery without angina pectoris: Secondary | ICD-10-CM | POA: Diagnosis not present

## 2021-02-13 DIAGNOSIS — Z86718 Personal history of other venous thrombosis and embolism: Secondary | ICD-10-CM | POA: Insufficient documentation

## 2021-02-13 DIAGNOSIS — I071 Rheumatic tricuspid insufficiency: Secondary | ICD-10-CM | POA: Diagnosis not present

## 2021-02-13 DIAGNOSIS — Z7901 Long term (current) use of anticoagulants: Secondary | ICD-10-CM | POA: Insufficient documentation

## 2021-02-13 DIAGNOSIS — J449 Chronic obstructive pulmonary disease, unspecified: Secondary | ICD-10-CM | POA: Insufficient documentation

## 2021-02-13 DIAGNOSIS — Z86711 Personal history of pulmonary embolism: Secondary | ICD-10-CM | POA: Diagnosis not present

## 2021-02-13 DIAGNOSIS — F17210 Nicotine dependence, cigarettes, uncomplicated: Secondary | ICD-10-CM | POA: Insufficient documentation

## 2021-02-13 DIAGNOSIS — Z952 Presence of prosthetic heart valve: Secondary | ICD-10-CM | POA: Diagnosis not present

## 2021-02-13 DIAGNOSIS — Z7984 Long term (current) use of oral hypoglycemic drugs: Secondary | ICD-10-CM | POA: Diagnosis not present

## 2021-02-13 MED ORDER — POTASSIUM CHLORIDE CRYS ER 20 MEQ PO TBCR: 20.0000 meq | EXTENDED_RELEASE_TABLET | Freq: Every day | ORAL | 6 refills | Status: DC

## 2021-02-13 MED ORDER — HYDRALAZINE HCL 25 MG PO TABS: 25.0000 mg | ORAL_TABLET | Freq: Three times a day (TID) | ORAL | 11 refills | Status: DC

## 2021-02-13 MED ORDER — ASPIRIN 81 MG PO CHEW: 81.0000 mg | CHEWABLE_TABLET | Freq: Every day | ORAL | 11 refills | Status: DC

## 2021-02-13 NOTE — Patient Instructions (Addendum)
It was a pleasure seeing you today!  MEDICATIONS: -We are changing your medications today -Increase hydralazine to 25 mg (1 tablet) three times daily -Decrease potassium chloride to 20 mEq (1 tablet) daily -Call if you have questions about your medications.   NEXT APPOINTMENT: Return to clinic in 4 weeks with [facility].  In general, to take care of your heart failure: -Limit your fluid intake to 2 Liters (half-gallon) per day.   -Limit your salt intake to ideally 2-3 grams (2000-3000 mg) per day. -Weigh yourself daily and record, and bring that "weight diary" to your next appointment.  (Weight gain of 2-3 pounds in 1 day typically means fluid weight.) -The medications for your heart are to help your heart and help you live longer.   -Please contact us before stopping any of your heart medications.  Call the clinic at [phone] with questions or to reschedule future appointments.

## 2021-02-16 NOTE — Progress Notes (Addendum)
Office Visit Note  Patient: Kim Anthony             Date of Birth: 07-11-1950           [name]: [id number]             PCP: Marianna Payment, MD Referring: Aldine Contes, MD Visit Date: 02/17/2021  Subjective:  New Patient (Initial Visit) (Right pain and swelling)   History of Present Illness: Kim Anthony is a 70 y.o. female here for inflammatory arthritis with right wrist radial extensor tendon inflammation. Symptoms started fairly acutely in November 2021 without preceding medical changes she can recall. Local steroid injection has not provided a long lasting benefit. She stopped taking celebrex with concerns on anticoagulation for mechanical prosthetic valve. She has felt a large improvement with steroid treatment intermittently.  She wears a rigid wrist brace for support that is partially helpful.  Otherwise when not on treatment has symptoms every day persistent swelling.  She has noticed increased development of swelling in the proximal finger joints on the right hand with some warmth and redness.  She is very stiff in the morning but also reports increased pain with use.  MRI of the right wrist obtained about 2 weeks ago demonstrated changes of synovitis and tenosynovitis as well as possible erosive changes.  Outside of the right hand and wrist she has had some chronic pain in the knees with episodes of swelling.  These were treated in orthopedics clinic with aspiration and injection has had good improvement in symptoms.  X-ray of the left knee did not show significant osteoarthritis change   Labs reviewed 01/2021 CBC WBC 11.4 Plts 438 CMP eGFR 60 HBV neg HCV neg  12/2020 ANA neg RF neg CCP neg ESR 28 CRP 1.5 Uric acid 10.7 LFTs AST 179 ALT 121  11/2020 Uric acid 9.9  Imaging reviewed 02/04/21 MRI Right wrist Radiocarpal, intercarpal, and distal radioulnar joint effusion with synovitis. Mild tenosynovitis of the second and third extensor compartments near the distal  intersection and of the fourth through sixth extensor compartments at the level of the distal carpal row. Possible erosive change of the proximal volar aspect of the triquetrum. Findings can be seen in inflammatory arthritis. Central perforation of the TFCC articular disc. Ulnar subluxation of the extensor carpi ulnaris tendon consistent with ECU subsheath tear.   09/26/20 Xray left knee Moderate knee joint effusion. No fracture or dislocation. No appreciable underlying joint space narrowing or erosion.  Activities of Daily Living:  Patient reports morning stiffness for 10 minutes.   Patient Reports nocturnal pain.  Difficulty dressing/grooming: Denies Difficulty climbing stairs: Denies Difficulty getting out of chair: Denies Difficulty using hands for taps, buttons, cutlery, and/or writing: Reports  Review of Systems  Constitutional:  Negative for fatigue.  HENT:  Negative for mouth dryness.   Eyes:  Negative for dryness.  Respiratory:  Negative for shortness of breath.   Cardiovascular:  Negative for swelling in legs/feet.  Gastrointestinal:  Negative for constipation.  Endocrine: Negative for cold intolerance.  Genitourinary:  Negative for difficulty urinating.  Musculoskeletal:  Positive for joint pain, gait problem, joint pain, joint swelling, muscle weakness, morning stiffness and muscle tenderness.  Skin:  Negative for rash.  Allergic/Immunologic: Positive for susceptible to infections.  Neurological:  Positive for weakness.  Hematological:  Negative for bruising/bleeding tendency.  Psychiatric/Behavioral:  Positive for sleep disturbance.    PMFS History:  Patient Active Problem List   Diagnosis Date Noted   Seronegative inflammatory arthritis 02/17/2021  High risk medication use 02/17/2021   Elevated transaminase level 01/27/2021   Respiratory distress    Dyspnea 01/05/2021   Acute idiopathic gout of multiple sites 09/30/2020   Eye redness 02/15/2020   Chronic pain  syndrome 01/17/2020   Monoarthritis of wrist 10/18/2019   Ganglion cyst 09/21/2019   Hip pain 08/23/2019   Thoracic ascending aortic aneurysm ([place]) 07/05/2019   Pustular rash 03/09/2019   Anemia 02/16/2019   Breast cancer ([place])    Acquired absence of left breast 12/20/2018   Open wound of axillary region 05/11/2018   COPD (chronic obstructive pulmonary disease) ([place]) 11/25/2017   S/P MVR (mitral valve replacement)    Chronic anticoagulation    Tobacco use disorder 02/25/2017   Lymphadenopathy, mediastinal 02/25/2017   History of mitral valve replacement with mechanical valve 11/18/2016   Essential hypertension 02/01/2016   Allergic rhinitis 02/01/2016   Healthcare maintenance 01/30/2016   Chronic low back pain 07/22/2015   Pulmonary embolism ([place]) 07/11/2015   Chronic combined systolic and diastolic heart failure ([place]) 07/11/2015   Headache, common migraine 07/11/2015   HLD (hyperlipidemia) 07/11/2015    Past Medical History:  Diagnosis Date   Arthritis    Asthma    Breast cancer ([place]) 2001   Left Breast Cancer   CHF (congestive heart failure) ([facility])    COPD (chronic obstructive pulmonary disease) ([place])    Coronary artery disease    History of mitral valve replacement with mechanical valve    HLD (hyperlipidemia)    Hypertension    Pulmonary embolism ([place])     Family History  Problem Relation Age of Onset   Hypertension Mother    Cancer Father    Hypertension Father    Breast cancer Maternal Aunt 50   Heart attack Other    Past Surgical History:  Procedure Laterality Date   APPLICATION OF A-CELL OF CHEST/ABDOMEN Left 01/27/2019   Procedure: APPLICATION OF A-CELL OF CHEST;  Surgeon: Wallace Going, DO;  Location: [facility];  Service: Plastics;  Laterality: Left;   APPLICATION OF WOUND VAC N/A 01/27/2019   Procedure: APPLICATION OF WOUND VAC;  Surgeon: Wallace Going, DO;  Location: [facility];  Service: Plastics;  Laterality: N/A;   COLONOSCOPY WITH PROPOFOL N/A  06/07/2018   Procedure: COLONOSCOPY WITH PROPOFOL;  Surgeon: Wilford Corner, MD;  Location: [facility];  Service: Endoscopy;  Laterality: N/A;   DEBRIDEMENT AND CLOSURE WOUND Left 12/28/2018   Procedure: Debridement And Closure Wound;  Surgeon: Coralie Keens, MD;  Location: [facility];  Service: General;  Laterality: Left;   INCISION AND DRAINAGE OF WOUND Left 01/27/2019   Procedure: IRRIGATION AND DEBRIDEMENT OF CHEST WALL;  Surgeon: Wallace Going, DO;  Location: [facility];  Service: Plastics;  Laterality: Left;   LATISSIMUS FLAP TO BREAST Left 01/18/2019   Procedure: LEFT LATISSIMUS FLAP TO BREAST;  Surgeon: Wallace Going, DO;  Location: [facility];  Service: Plastics;  Laterality: Left;   MASTECTOMY Left 2000s   for breast cancer, also s/p radiation and chemo   MITRAL VALVE REPLACEMENT     mechanical   POLYPECTOMY  06/07/2018   Procedure: POLYPECTOMY;  Surgeon: Wilford Corner, MD;  Location: [facility];  Service: Endoscopy;;   RIGHT/LEFT HEART CATH AND CORONARY ANGIOGRAPHY N/A 01/09/2021   Procedure: RIGHT/LEFT HEART CATH AND CORONARY ANGIOGRAPHY;  Surgeon: Jolaine Artist, MD;  Location: [facility];  Service: Cardiovascular;  Laterality: N/A;   WOUND DEBRIDEMENT Left 12/28/2018   Procedure: EXCISION OF LEFT CHRONIC  CHEST WALL WOUND;  Surgeon: Coralie Keens, MD;  Location: [facility];  Service: General;  Laterality: Left;   Social History   Social History Narrative   ** Merged History Encounter **       Immunization History  Administered Date(s) Administered   Influenza,inj,Quad PF,6+ Mos 07/14/2015, 05/27/2017, 03/24/2018, 03/09/2019   Moderna SARS-COV2 Booster Vaccination 04/24/2020   Moderna Sars-Covid-2 Vaccination 08/31/2019, 10/10/2019   Pneumococcal Conjugate-13 11/09/2017   Pneumococcal Polysaccharide-23 11/12/2020   Tdap 01/30/2016     Objective: Vital Signs: BP 122/68 (BP Location: Right Arm, Patient Position: Sitting, Cuff Size: Normal)    Pulse 92   Resp 17   Ht '5\' 5"'  (1.651 m)   Wt 174 lb (78.9 kg)   BMI 28.96 kg/m    Physical Exam HENT:     Right Ear: External ear normal.     Left Ear: External ear normal.     Mouth/Throat:     Mouth: Mucous membranes are moist.     Pharynx: Oropharynx is clear.  Cardiovascular:     Rate and Rhythm: Normal rate and regular rhythm.     Comments: Mechanical heart valve Pulmonary:     Effort: Pulmonary effort is normal.     Breath sounds: Normal breath sounds.  Skin:    General: Skin is warm and dry.     Findings: No rash.  Neurological:     General: No focal deficit present.     Mental Status: She is alert.  Psychiatric:        Mood and Affect: Mood normal.     Musculoskeletal Exam:  Neck full ROM no tenderness Left shoulder decreased external rotation when abducted with lateral tenderness, right shoulder normal range of motion Elbows full ROM no tenderness or swelling Right wrist tenderness swelling and faint erythema decreased flexion range of motion, left wrist is normal Tenderness and swelling of the right second third MCP joints with mild overlying erythema, slightly decreased flexion range of motion, early swan-neck deformity of the third digit tenderness to the second third and fifth PIP joints, left hand appears normal Knees full ROM no tenderness or swelling Ankles full ROM no tenderness or swelling LEft 1st MTP bunion with tenderness decreased ROM   CDAI Exam: CDAI Score: 17  Patient Global: 50 mm; Provider Global: 30 mm Swollen: 3 ; Tender: 7  Joint Exam 02/17/2021      Right  Left  Wrist  Swollen Tender     CMC   Tender     MCP 2  Swollen Tender     MCP 3  Swollen Tender     PIP 2   Tender     PIP 3   Tender     PIP 5   Tender        Investigation: No additional findings.  Imaging: MR WRIST RIGHT WO CONTRAST  Result Date: 02/04/2021 CLINICAL DATA:  Right wrist pain EXAM: MR OF THE RIGHT WRIST WITHOUT CONTRAST TECHNIQUE: Multiplanar,  multisequence MR imaging of the right wrist was performed. No intravenous contrast was administered. COMPARISON:  Wrist CT 08/22/2020 FINDINGS: Ligaments: Intact scapholunate and lunotriquetral ligaments. Triangular fibrocartilage: Central perforation of the TFCC articular disc. Tendons: Tenosynovitis of the second and third extensor compartments near the distal intersection. Mild tenosynovitis of the fourth, fifth, and sixth extensor compartments at the level of the distal carpal row. Ulnar subluxation of the extensor carpi ulnaris tendon with adjacent edema. Carpal tunnel/median nerve: Flexor retinaculum is intact. Normal carpal tunnel without  a mass. Median nerve demonstrates normal signal and caliber. Guyon's canal: Normal Guyon's canal. Normal ulnar nerve. Joint/cartilage: Mild radiocarpal and intercarpal effusion with synovitis. Distension of the distal radioulnar joint with synovitis. Bones/carpal alignment: Possible erosive change at the proximal volar aspect of the triquetrum (axial T1 image 14). Other: No muscle atrophy.  No soft tissue mass. IMPRESSION: Radiocarpal, intercarpal, and distal radioulnar joint effusion with synovitis. Mild tenosynovitis of the second and third extensor compartments near the distal intersection and of the fourth through sixth extensor compartments at the level of the distal carpal row. Possible erosive change of the proximal volar aspect of the triquetrum. Findings can be seen in inflammatory arthritis. Central perforation of the TFCC articular disc. Ulnar subluxation of the extensor carpi ulnaris tendon consistent with ECU subsheath tear. Electronically Signed   By: Maurine Simmering M.D.   On: 02/04/2021 14:19    Recent Labs: Lab Results  Component Value Date   WBC 11.4 (H) 02/03/2021   HGB 13.3 02/03/2021   PLT 438 (H) 02/03/2021   NA 134 (L) 02/03/2021   K 4.4 02/03/2021   CL 100 02/03/2021   CO2 22 02/03/2021   GLUCOSE 104 (H) 02/03/2021   BUN 27 (H) 02/03/2021    CREATININE 1.01 (H) 02/03/2021   BILITOT 0.7 02/03/2021   ALKPHOS 107 02/03/2021   AST 39 02/03/2021   ALT 29 02/03/2021   PROT 8.3 (H) 02/03/2021   ALBUMIN 3.9 02/03/2021   CALCIUM 10.0 02/03/2021   GFRAA 104 02/28/2020    Speciality Comments: No specialty comments available.  Procedures:  No procedures performed Allergies: Lisinopril, Hydrocodone-acetaminophen, Acetaminophen-codeine, Propoxyphene, Tape, and Tramadol   Assessment / Plan:     Visit Diagnoses: Seronegative inflammatory arthritis - Plan: Sedimentation rate, predniSONE (DELTASONE) 10 MG tablet  Appears consistent with a seronegative rheumatoid arthritis recurrent moderate disease activity currently limited to the right hand and wrist.  She is not a great candidate for methotrexate recent liver function abnormalities also on multiple medications for cardiovascular disease.  Discussed could probably try starting hydroxychloroquine with lower medication interactions.  She does have a moderately decreased left ventricular ejection fraction but I expect the risk of cardiomyopathy much less than with continue long-term corticosteroid treatment or uncontrolled inflammation.  Recommend starting prednisone 10 mg p.o. daily for now we will follow-up within 2 weeks to reassess response to this dose.  History of mitral valve replacement with mechanical valve Chronic anticoagulation  On long-term Coumadin anticoagulation had 1 episode of abnormal bleeding from the tongue otherwise no known complications and liver function problems with abnormal clotting times resolved since earlier this year.  Not a good candidate for NSAIDs, also not great for long-term corticosteroids.  High risk medication use - Plan: QuantiFERON-TB Gold Plus, IgG, IgA, IgM  Discussed risks of hydroxychloroquine treatment including retinal toxicity requiring annual screening also some risk for cardiac toxicity but less than some other alternatives.  Her comorbidities  may also limit some biologic DMARD options if needed.  Checking baseline QuantiFERON and quantitative immunoglobulins at this time.  Recent CBC and CMP from within the past month were tolerable.  Orders: Orders Placed This Encounter  Procedures   Sedimentation rate   QuantiFERON-TB Gold Plus   IgG, IgA, IgM    Meds ordered this encounter  Medications   predniSONE (DELTASONE) 10 MG tablet    Sig: Take 1 tablet (10 mg total) by mouth daily with breakfast.    Dispense:  15 tablet    Refill:  0  Follow-Up Instructions: Return in about 10 days (around 02/27/2021) for New pt RA f/u 1-2 wks.   Collier Salina, MD  Note - This record has been created using [employer].  Chart creation errors have been sought, but may not always  have been located. Such creation errors do not reflect on  the standard of medical care.

## 2021-02-17 ENCOUNTER — Ambulatory Visit: Payer: Medicare Other

## 2021-02-17 ENCOUNTER — Other Ambulatory Visit: Payer: Self-pay

## 2021-02-17 ENCOUNTER — Other Ambulatory Visit: Payer: Self-pay | Admitting: Internal Medicine

## 2021-02-17 ENCOUNTER — Ambulatory Visit (INDEPENDENT_AMBULATORY_CARE_PROVIDER_SITE_OTHER): Payer: Medicare Other | Admitting: Internal Medicine

## 2021-02-17 ENCOUNTER — Encounter: Payer: Self-pay | Admitting: Internal Medicine

## 2021-02-17 VITALS — BP 122/68 | HR 92 | Resp 17 | Ht 65.0 in | Wt 174.0 lb

## 2021-02-17 DIAGNOSIS — Z952 Presence of prosthetic heart valve: Secondary | ICD-10-CM | POA: Diagnosis not present

## 2021-02-17 DIAGNOSIS — M1380 Other specified arthritis, unspecified site: Secondary | ICD-10-CM

## 2021-02-17 DIAGNOSIS — Z7901 Long term (current) use of anticoagulants: Secondary | ICD-10-CM

## 2021-02-17 DIAGNOSIS — Z79899 Other long term (current) drug therapy: Secondary | ICD-10-CM | POA: Insufficient documentation

## 2021-02-17 HISTORY — DX: Other specified arthritis, unspecified site: M13.80

## 2021-02-17 MED ORDER — PREDNISONE 10 MG PO TABS: 10.0000 mg | ORAL_TABLET | Freq: Every day | ORAL | 0 refills | Status: DC

## 2021-02-17 NOTE — Patient Instructions (Addendum)
Rheumatoid Arthritis Rheumatoid arthritis (RA) is a long-term (chronic) disease that causes inflammation in your joints. RA may start slowly. It most often affects the small joints of the hands and feet. Usually, the same joints are affected on both sides of your body. Inflammation from RA can also affectother parts of your body, including your heart, eyes, or lungs. There is no cure for RA, but medicines can help your symptoms and halt or slowdown the progression of the disease. What are the causes? RA is an autoimmune disease. When you have an autoimmune disease, your body's defense system (immune system) mistakenly attacks healthy body tissues. The exact cause of RA is not known. What increases the risk? You are more likely to develop this condition if you: Are a woman. Have a family history of RA or other autoimmune diseases. Have a history of smoking. Are obese. Have been exposed to pollutants or chemicals. What are the signs or symptoms? The first symptom of this condition may be morning stiffness that lasts longer than 30 minutes. Symptoms usually start gradually. They are often worse in the morning. As RA progresses, symptoms may include: Pain, stiffness, swelling, warmth, and tenderness in joints on both sides of your body. Loss of energy. Loss of appetite. Weight loss. Low-grade fever. Dry eyes and dry mouth. Firm lumps (rheumatoid nodules) that grow beneath your skin in areas such as your forearm bones near your elbows and on your hands. Changes in the appearance of joints (deformity) and loss of joint function. Symptoms of this condition vary from person to person. Symptoms of RA often come and go. Sometimes, symptoms get worse for a period of time. These are called flares. How is this diagnosed? This condition is diagnosed based on your symptoms, medical history, and physical exam. You may have X-rays or an MRI to check for the type of joint changes that are caused by  RA. You may also have blood tests to look for: Proteins (antibodies) that your immune system may make if you have RA. These include rheumatoid factor (RF) and anti-CCP. When blood tests show these proteins, you are said to have "seropositive RA." When blood tests do not show these proteins, you may have "seronegative RA." Inflammation in your blood. A low number of red blood cells (anemia). How is this treated? The goals of treatment are to relieve pain, reduce inflammation, and slow down or stop joint damage and disability. Treatment may include: Lifestyle changes. It is important to rest as needed, eat a healthy diet, and exercise. Medicines. Your health care provider may adjust your medicines every 3 months until treatment goals are reached. Common medicines include: Pain relievers (analgesics). Corticosteroids and NSAIDs to reduce inflammation. Disease-modifying antirheumatic drugs (DMARDs) to try to slow the course of the disease. Biologic response modifiers to reduce inflammation and damage. Physical therapy and occupational therapy. Surgery, if you have severe joint damage. Joint replacement or fusing of joints may be needed. Your health care provider will work with you to identify the best treatmentoption for you based on assessment of the overall disease activity in your body. Follow these instructions at home: Activity Return to your normal activities as told by your health care provider. Ask your health care provider what activities are safe for you. Rest when you are having a flare. Start an exercise program as told by your health care provider. General instructions Keep all follow-up visits as told by your health care provider. This is important. Take over-the-counter and prescription medicines only as  told by your health care provider. Where to find more information [employer] [facility] [employer]: www.rheumatology.[place]: www.arthritis.org Contact a  health care provider if: You have a flare-up of RA symptoms. You have a fever. You have side effects from your medicines. Get help right away if: You have chest pain. You have trouble breathing. You quickly develop a hot, painful joint that is more severe than your usual joint aches. Summary Rheumatoid arthritis (RA) is a long-term (chronic) disease that causes inflammation in your joints. RA is an autoimmune disease. The goals of treatment are to relieve pain, reduce inflammation, and slow down or stop joint damage and disability.  Hydroxychloroquine tablets What is this medication? HYDROXYCHLOROQUINE (hye drox ee KLOR oh kwin) is used to treat rheumatoidarthritis and systemic lupus erythematosus. It is also used to treat malaria. This medicine may be used for other purposes; ask your health care provider orpharmacist if you have questions. COMMON BRAND NAME(S): Plaquenil, Quineprox What should I tell my care team before I take this medication? They need to know if you have any of these conditions: diabetes eye disease, vision problems G6PD deficiency heart disease history of irregular heartbeat if you often drink alcohol kidney disease liver disease porphyria psoriasis an unusual or allergic reaction to chloroquine, hydroxychloroquine, other medicines, foods, dyes, or preservatives pregnant or trying to get pregnant breast-feeding How should I use this medication? Take this medicine by mouth with a glass of water. Take it as directed on the prescription label. Do not cut, crush or chew this medicine. Swallow the tablets whole. Take it with food. Do not take it more than directed. Take all of this medicine unless your health care provider tells you to stop it early.Keep taking it even if you think you are better. Take products with antacids in them at a different time of day than this medicine. Take this medicine 4 hours before or 4 hours after antacids. Talk toyour health care  provider if you have questions. Talk to your pediatrician regarding the use of this medicine in children. Whilethis drug may be prescribed for selected conditions, precautions do apply. Overdosage: If you think you have taken too much of this medicine contact apoison control center or emergency room at once. NOTE: This medicine is only for you. Do not share this medicine with others. What if I miss a dose? If you miss a dose, take it as soon as you can. If it is almost time for yournext dose, take only that dose. Do not take double or extra doses. What may interact with this medication? Do not take this medicine with any of the following medications: cisapride dronedarone pimozide thioridazine This medicine may also interact with the following medications: ampicillin antacids cimetidine cyclosporine digoxin kaolin medicines for diabetes, like insulin, glipizide, glyburide medicines for seizures like carbamazepine, phenobarbital, phenytoin mefloquine methotrexate other medicines that prolong the QT interval (cause an abnormal heart rhythm) praziquantel This list may not describe all possible interactions. Give your health care provider a list of all the medicines, herbs, non-prescription drugs, or dietary supplements you use. Also tell them if you smoke, drink alcohol, or use illegaldrugs. Some items may interact with your medicine. What should I watch for while using this medication? Visit your health care provider for regular checks on your progress. Tell your health care provider if your symptoms do not start to get better or if they getworse. You may need blood work done while you are taking this medicine. If you take other  medicines that can affect heart rhythm, you may need more testing. Talkto your health care provider if you have questions. Your vision may be tested before and during use of this medicine. Tell yourhealth care provider right away if you have any change in your  eyesight. This medicine may cause serious skin reactions. They can happen weeks to months after starting the medicine. Contact your health care provider right away if you notice fevers or flu-like symptoms with a rash. The rash may be red or purple and then turn into blisters or peeling of the skin. Or, you might notice a red rash with swelling of the face, lips or lymph nodes in your neck or underyour arms. If you or your family notice any changes in your behavior, such as new or worsening depression, thoughts of harming yourself, anxiety, or other unusual or disturbing thoughts, or memory loss, call your health care provider rightaway. What side effects may I notice from receiving this medication? Side effects that you should report to your doctor or health care professionalas soon as possible: allergic reactions (skin rash, itching or hives; swelling of the face, lips, or tongue) changes in vision decreased hearing, ringing in the ears heartbeat rhythm changes (trouble breathing; chest pain; dizziness; fast, irregular heartbeat; feeling faint or lightheaded, falls) liver injury (dark yellow or brown urine; general ill feeling or flu-like symptoms; loss of appetite, right upper belly pain; unusually weak or tired, yellowing of the eyes or skin) low blood sugar (feeling anxious; confusion; dizziness; increased hunger; unusually weak or tired; increased sweating; shakiness; cold, clammy skin; irritable; headache; blurred vision; fast heartbeat; loss of consciousness) low red blood cell counts (trouble breathing; feeling faint; lightheaded, falls; unusually weak or tired) muscle weakness pain, tingling, numbness in the hands or feet rash, fever, and swollen lymph nodes redness, blistering, peeling or loosening of the skin, including inside the mouth suicidal thoughts, mood changes uncontrollable head, mouth, neck, arm, or leg movements unusual bruising or bleeding Side effects that usually do not  require medical attention (report to yourdoctor or health care professional if they continue or are bothersome): diarrhea hair loss irritable This list may not describe all possible side effects. Call your doctor for medical advice about side effects. You may report side effects to FDA at1-800-FDA-1088. Where should I keep my medication? Keep out of the reach of children and pets. Store at room temperature up to 30 degrees C (86 degrees F). Protect fromlight. Get rid of any unused medicine after the expiration date. To get rid of medicines that are no longer needed or have expired: Take the medicine to a medicine take-back program. Check with your pharmacy or law enforcement to find a location. If you cannot return the medicine, check the label or package insert to see if the medicine should be thrown out in the garbage or flushed down the toilet. If you are not sure, ask your health care provider. If it is safe to put it in the trash, empty the medicine out of the container. Mix the medicine with cat litter, dirt, coffee grounds, or other unwanted substance. Seal the mixture in a bag or container. Put it in the trash.  Prednisone tablets What is this medication? PREDNISONE (PRED ni sone) is a corticosteroid. It is commonly used to treat inflammation of the skin, joints, lungs, and other organs. Common conditions treated include asthma, allergies, and arthritis. It is also used for otherconditions, such as blood disorders and diseases of the adrenal glands. This medicine  may be used for other purposes; ask your health care provider orpharmacist if you have questions. COMMON BRAND NAME(S): Deltasone, Predone, Sterapred, Sterapred DS What should I tell my care team before I take this medication? They need to know if you have any of these conditions: Cushing's syndrome diabetes glaucoma heart disease high blood pressure infection (especially a virus infection such as chickenpox, cold sores, or  herpes) kidney disease liver disease mental illness myasthenia gravis osteoporosis seizures stomach or intestine problems thyroid disease an unusual or allergic reaction to lactose, prednisone, other medicines, foods, dyes, or preservatives pregnant or trying to get pregnant breast-feeding How should I use this medication? Take this medicine by mouth with a glass of water. Follow the directions on the prescription label. Take this medicine with food. If you are taking this medicine once a day, take it in the morning. Do not take more medicine than you are told to take. Do not suddenly stop taking your medicine because you may develop a severe reaction. Your doctor will tell you how much medicine to take. If your doctor wants you to stop the medicine, the dose may be slowly loweredover time to avoid any side effects. Talk to your pediatrician regarding the use of this medicine in children.Special care may be needed. Overdosage: If you think you have taken too much of this medicine contact apoison control center or emergency room at once. NOTE: This medicine is only for you. Do not share this medicine with others. What if I miss a dose? If you miss a dose, take it as soon as you can. If it is almost time for your next dose, talk to your doctor or health care professional. You may need to miss a dose or take an extra dose. Do not take double or extra doses withoutadvice. What may interact with this medication? Do not take this medicine with any of the following medications: metyrapone mifepristone This medicine may also interact with the following medications: aminoglutethimide amphotericin B aspirin and aspirin-like medicines barbiturates certain medicines for diabetes, like glipizide or glyburide cholestyramine cholinesterase inhibitors cyclosporine digoxin diuretics ephedrine female hormones, like estrogens and birth control pills isoniazid ketoconazole NSAIDS, medicines for  pain and inflammation, like ibuprofen or naproxen phenytoin rifampin toxoids vaccines warfarin This list may not describe all possible interactions. Give your health care provider a list of all the medicines, herbs, non-prescription drugs, or dietary supplements you use. Also tell them if you smoke, drink alcohol, or use illegaldrugs. Some items may interact with your medicine. What should I watch for while using this medication? Visit your doctor or health care professional for regular checks on your progress. If you are taking this medicine over a prolonged period, carry an identification card with your name and address, the type and dose of yourmedicine, and your doctor's name and address. This medicine may increase your risk of getting an infection. Tell your doctor or health care professional if you are around anyone with measles orchickenpox, or if you develop sores or blisters that do not heal properly. If you are going to have surgery, tell your doctor or health care professionalthat you have taken this medicine within the last twelve months. Ask your doctor or health care professional about your diet. You may need tolower the amount of salt you eat. This medicine may increase blood sugar. Ask your healthcare provider if changesin diet or medicines are needed if you have diabetes. What side effects may I notice from receiving this medication? Side effects that  you should report to your doctor or health care professionalas soon as possible: allergic reactions like skin rash, itching or hives, swelling of the face, lips, or tongue changes in emotions or moods changes in vision depressed mood eye pain fever or chills, cough, sore throat, pain or difficulty passing urine signs and symptoms of high blood sugar such as being more thirsty or hungry or having to urinate more than normal. You may also feel very tired or have blurry vision. swelling of ankles, feet Side effects that usually do  not require medical attention (report to yourdoctor or health care professional if they continue or are bothersome): confusion, excitement, restlessness headache nausea, vomiting skin problems, acne, thin and shiny skin trouble sleeping weight gain This list may not describe all possible side effects. Call your doctor for medical advice about side effects. You may report side effects to FDA at1-800-FDA-1088. Where should I keep my medication? Keep out of the reach of children. Store at room temperature between 15 and 30 degrees C (59 and 86 degrees F). Protect from light. Keep container tightly closed. Throw away any unusedmedicine after the expiration date. NOTE: This sheet is a summary. It may not cover all possible information. If you have questions about this medicine, talk to your doctor, pharmacist, orhealth care provider.  2022 [employer] (2018-03-08 10:54:22)

## 2021-02-21 LAB — IGG, IGA, IGM
IgG (Immunoglobin G), Serum: 1603 mg/dL — ABNORMAL HIGH (ref 600–1540)
IgM, Serum: 85 mg/dL (ref 50–300)
Immunoglobulin A: 708 mg/dL — ABNORMAL HIGH (ref 70–320)

## 2021-02-21 LAB — QUANTIFERON-TB GOLD PLUS
Mitogen-NIL: 7.63 IU/mL
NIL: 0.03 IU/mL
QuantiFERON-TB Gold Plus: NEGATIVE
TB1-NIL: 0.01 IU/mL
TB2-NIL: 0 IU/mL

## 2021-02-21 LAB — SEDIMENTATION RATE: Sed Rate: 68 mm/h — ABNORMAL HIGH (ref 0–30)

## 2021-02-25 ENCOUNTER — Telehealth: Payer: Self-pay

## 2021-02-25 NOTE — Telephone Encounter (Signed)
12:33pm - CSW received a call from Ms. Stryjewski asking about her Food Stamp application and that the [employer] reported that they did not have an application for her and neither did [facility]. Ms. Peshlakai also reported needing help with one of her bills that is due 03/10/21 for $149.47. CSW informed Ms. Ewbank that a Food Stamp referral was sent to the [facility] on 01/10/2021 and the CSW emailed it again to the [facility] today, 02/25/21 for verification. CSW called the [facility] and had to leave a voicemail for staff asking about the referral sent to them back in July and again sent over today and to please follow up with the patient. Ms. Carpentier is scheduled to follow up at the [facility] on 03/11/21. CSW will get back with Ms. Mullikin about her upcoming bill and encouraged her to also follow up with the outpatient heart failure social worker.  Valecia Beske, MSW, [place] Heart Failure Social Worker

## 2021-02-26 ENCOUNTER — Other Ambulatory Visit: Payer: Self-pay | Admitting: *Deleted

## 2021-02-26 ENCOUNTER — Ambulatory Visit: Payer: Medicare Other

## 2021-02-26 DIAGNOSIS — T7840XA Allergy, unspecified, initial encounter: Secondary | ICD-10-CM

## 2021-02-26 MED ORDER — FLUTICASONE PROPIONATE 50 MCG/ACT NA SUSP: 1.0000 | Freq: Every day | NASAL | 11 refills | Status: DC | PRN

## 2021-02-26 NOTE — Patient Instructions (Signed)
Visit Information   Goals Addressed               This Visit's Progress     Cope with Pain in right wrist        Timeframe:  Short-Term Goal Priority:  High Start Date:      06/20/20                       Expected End Date:   ongoing                  Follow Up Date 04/20/21   - keep all provider appointments  - use distraction techniques - use relaxation during pain   -wear right wrist brace - contact provider for unrelieved severe pain Why is this important?   Living with back pain and enjoying your life may be hard.  Feelings, such as depression or anger, can make your pain worse.  Learning ways to cope may help you find some relief from the pain.    Notes:       Track and Manage My Symptoms-COPD- "I know when to use my nebulizer machine." (pt-stated)        Timeframe:  Long-Range Goal Priority:  High Start Date:          06/20/20                   Expected End Date:   ongoing                    Follow Up Date 04/20/21   - eliminate symptom triggers at home - follow rescue plan if symptoms flare-up - keep follow-up appointments - use an extra pillow to sleep if needed   Why is this important?   Tracking your symptoms and other information about your health helps your doctor plan your care.  Write down the symptoms, the time of day, what you were doing and what medicine you are taking.  You will soon learn how to manage your symptoms.     Notes:         The patient verbalized understanding of instructions, educational materials, and care plan provided today and declined offer to receive copy of patient instructions, educational materials, and care plan.   Telephone follow up appointment with care management team member scheduled for:03/26/21'@11'$ :00am.  [name], RN, BSN, CCM Care Management Coordinator [facility] Phone: [phone] / Fax: [phone]

## 2021-02-26 NOTE — Chronic Care Management (AMB) (Signed)
 Care Management    RN Visit Note  02/26/2021 Name: Kim Anthony [name]: [id number] DOB: 03/02/1951  Subjective: Kim Anthony is a 70 y.o. year old female who is a primary care patient of Coe, Benjamin, MD. The care management team was consulted for assistance with disease management and care coordination needs.    Engaged with patient by telephone for follow up visit in response to provider referral for case management and/or care coordination services.   Consent to Services:   [name] was given information about Care Management services today including:  Care Management services includes personalized support from designated clinical staff supervised by her physician, including individualized plan of care and coordination with other care providers 24/7 contact phone numbers for assistance for urgent and routine care needs. The patient may stop case management services at any time by phone call to the office staff.  Patient agreed to services and consent obtained.   Assessment: Review of patient past medical history, allergies, medications, health status, including review of consultants reports, laboratory and other test data, was performed as part of comprehensive evaluation and provision of chronic care management services.   SDOH (Social Determinants of Health) assessments and interventions performed:    Care Plan  Allergies  Allergen Reactions   Lisinopril Swelling   Hydrocodone-Acetaminophen Itching   Acetaminophen-Codeine Itching    Other reaction(s): Other (See Comments)   Propoxyphene Itching    Darvocet   Tape Rash    Plastic   Tramadol Itching, Nausea And Vomiting and Rash    Outpatient Encounter Medications as of 02/26/2021  Medication Sig   albuterol (PROVENTIL) (2.5 MG/3ML) 0.083% nebulizer solution USE 1 VIAL IN NEBULIZER EVERY 6 HOURS AS NEEDED FOR WHEEZING FOR SHORTNESS OF BREATH   albuterol (VENTOLIN HFA) 108 (90 Base) MCG/ACT inhaler INHALE 2 PUFFS BY MOUTH EVERY 6  HOURS AS NEEDED FOR WHEEZING FOR SHORTNESS OF BREATH   aspirin 81 MG chewable tablet Chew 1 tablet (81 mg total) by mouth daily.   FARXIGA 10 MG TABS tablet Take 1 tablet by mouth once daily   fluticasone (FLONASE) 50 MCG/ACT nasal spray Place 1 spray into both nostrils daily as needed for allergies.   furosemide (LASIX) 40 MG tablet Take 1 tablet (40 mg total) by mouth daily.   gabapentin (NEURONTIN) 600 MG tablet Take 1.5 tablets (900 mg total) by mouth 3 (three) times daily. For nerve pain- increased to get her off lyrica   hydrALAZINE (APRESOLINE) 25 MG tablet Take 1 tablet (25 mg total) by mouth 3 (three) times daily.   HYDROcodone-acetaminophen (NORCO) 5-325 MG tablet Take 1 tablet by mouth every 4 (four) hours as needed for moderate pain.   isosorbide mononitrate (IMDUR) 30 MG 24 hr tablet Take 1 tablet by mouth once daily   metoprolol succinate (TOPROL-XL) 25 MG 24 hr tablet Take 1 tablet (25 mg total) by mouth daily.   nicotine (NICODERM CQ - DOSED IN MG/24 HR) 7 mg/24hr patch Place 2 patches (14 mg total) onto the skin daily for 120 days, THEN 1 patch (7 mg total) daily. (Patient not taking: Reported on 02/17/2021)   oxyCODONE-acetaminophen (PERCOCET/ROXICET) 5-325 MG tablet oxycodone-acetaminophen 5 mg-325 mg tablet  Take 1 tablet every 6 hours by oral route as needed for 7 days. (Patient not taking: Reported on 02/17/2021)   potassium chloride SA (KLOR-CON) 20 MEQ tablet Take 1 tablet (20 mEq total) by mouth daily.   predniSONE (DELTASONE) 10 MG tablet Take 1 tablet (10 mg total) by mouth    Care Management    RN Visit Note  02/26/2021 Name: Kim Anthony [name]: [id number] DOB: 03/02/1951  Subjective: Kim Anthony is a 70 y.o. year old female who is a primary care patient of Coe, Benjamin, MD. The care management team was consulted for assistance with disease management and care coordination needs.    Engaged with patient by telephone for follow up visit in response to provider referral for case management and/or care coordination services.   Consent to Services:   [name] was given information about Care Management services today including:  Care Management services includes personalized support from designated clinical staff supervised by her physician, including individualized plan of care and coordination with other care providers 24/7 contact phone numbers for assistance for urgent and routine care needs. The patient may stop case management services at any time by phone call to the office staff.  Patient agreed to services and consent obtained.   Assessment: Review of patient past medical history, allergies, medications, health status, including review of consultants reports, laboratory and other test data, was performed as part of comprehensive evaluation and provision of chronic care management services.   SDOH (Social Determinants of Health) assessments and interventions performed:    Care Plan  Allergies  Allergen Reactions   Lisinopril Swelling   Hydrocodone-Acetaminophen Itching   Acetaminophen-Codeine Itching    Other reaction(s): Other (See Comments)   Propoxyphene Itching    Darvocet   Tape Rash    Plastic   Tramadol Itching, Nausea And Vomiting and Rash    Outpatient Encounter Medications as of 02/26/2021  Medication Sig   albuterol (PROVENTIL) (2.5 MG/3ML) 0.083% nebulizer solution USE 1 VIAL IN NEBULIZER EVERY 6 HOURS AS NEEDED FOR WHEEZING FOR SHORTNESS OF BREATH   albuterol (VENTOLIN HFA) 108 (90 Base) MCG/ACT inhaler INHALE 2 PUFFS BY MOUTH EVERY 6  HOURS AS NEEDED FOR WHEEZING FOR SHORTNESS OF BREATH   aspirin 81 MG chewable tablet Chew 1 tablet (81 mg total) by mouth daily.   FARXIGA 10 MG TABS tablet Take 1 tablet by mouth once daily   fluticasone (FLONASE) 50 MCG/ACT nasal spray Place 1 spray into both nostrils daily as needed for allergies.   furosemide (LASIX) 40 MG tablet Take 1 tablet (40 mg total) by mouth daily.   gabapentin (NEURONTIN) 600 MG tablet Take 1.5 tablets (900 mg total) by mouth 3 (three) times daily. For nerve pain- increased to get her off lyrica   hydrALAZINE (APRESOLINE) 25 MG tablet Take 1 tablet (25 mg total) by mouth 3 (three) times daily.   HYDROcodone-acetaminophen (NORCO) 5-325 MG tablet Take 1 tablet by mouth every 4 (four) hours as needed for moderate pain.   isosorbide mononitrate (IMDUR) 30 MG 24 hr tablet Take 1 tablet by mouth once daily   metoprolol succinate (TOPROL-XL) 25 MG 24 hr tablet Take 1 tablet (25 mg total) by mouth daily.   nicotine (NICODERM CQ - DOSED IN MG/24 HR) 7 mg/24hr patch Place 2 patches (14 mg total) onto the skin daily for 120 days, THEN 1 patch (7 mg total) daily. (Patient not taking: Reported on 02/17/2021)   oxyCODONE-acetaminophen (PERCOCET/ROXICET) 5-325 MG tablet oxycodone-acetaminophen 5 mg-325 mg tablet  Take 1 tablet every 6 hours by oral route as needed for 7 days. (Patient not taking: Reported on 02/17/2021)   potassium chloride SA (KLOR-CON) 20 MEQ tablet Take 1 tablet (20 mEq total) by mouth daily.   predniSONE (DELTASONE) 10 MG tablet Take 1 tablet (10 mg total) by mouth    Care Management    RN Visit Note  02/26/2021 Name: Kim Anthony [name]: [id number] DOB: 03/02/1951  Subjective: Kim Anthony is a 70 y.o. year old female who is a primary care patient of Coe, Benjamin, MD. The care management team was consulted for assistance with disease management and care coordination needs.    Engaged with patient by telephone for follow up visit in response to provider referral for case management and/or care coordination services.   Consent to Services:   [name] was given information about Care Management services today including:  Care Management services includes personalized support from designated clinical staff supervised by her physician, including individualized plan of care and coordination with other care providers 24/7 contact phone numbers for assistance for urgent and routine care needs. The patient may stop case management services at any time by phone call to the office staff.  Patient agreed to services and consent obtained.   Assessment: Review of patient past medical history, allergies, medications, health status, including review of consultants reports, laboratory and other test data, was performed as part of comprehensive evaluation and provision of chronic care management services.   SDOH (Social Determinants of Health) assessments and interventions performed:    Care Plan  Allergies  Allergen Reactions   Lisinopril Swelling   Hydrocodone-Acetaminophen Itching   Acetaminophen-Codeine Itching    Other reaction(s): Other (See Comments)   Propoxyphene Itching    Darvocet   Tape Rash    Plastic   Tramadol Itching, Nausea And Vomiting and Rash    Outpatient Encounter Medications as of 02/26/2021  Medication Sig   albuterol (PROVENTIL) (2.5 MG/3ML) 0.083% nebulizer solution USE 1 VIAL IN NEBULIZER EVERY 6 HOURS AS NEEDED FOR WHEEZING FOR SHORTNESS OF BREATH   albuterol (VENTOLIN HFA) 108 (90 Base) MCG/ACT inhaler INHALE 2 PUFFS BY MOUTH EVERY 6  HOURS AS NEEDED FOR WHEEZING FOR SHORTNESS OF BREATH   aspirin 81 MG chewable tablet Chew 1 tablet (81 mg total) by mouth daily.   FARXIGA 10 MG TABS tablet Take 1 tablet by mouth once daily   fluticasone (FLONASE) 50 MCG/ACT nasal spray Place 1 spray into both nostrils daily as needed for allergies.   furosemide (LASIX) 40 MG tablet Take 1 tablet (40 mg total) by mouth daily.   gabapentin (NEURONTIN) 600 MG tablet Take 1.5 tablets (900 mg total) by mouth 3 (three) times daily. For nerve pain- increased to get her off lyrica   hydrALAZINE (APRESOLINE) 25 MG tablet Take 1 tablet (25 mg total) by mouth 3 (three) times daily.   HYDROcodone-acetaminophen (NORCO) 5-325 MG tablet Take 1 tablet by mouth every 4 (four) hours as needed for moderate pain.   isosorbide mononitrate (IMDUR) 30 MG 24 hr tablet Take 1 tablet by mouth once daily   metoprolol succinate (TOPROL-XL) 25 MG 24 hr tablet Take 1 tablet (25 mg total) by mouth daily.   nicotine (NICODERM CQ - DOSED IN MG/24 HR) 7 mg/24hr patch Place 2 patches (14 mg total) onto the skin daily for 120 days, THEN 1 patch (7 mg total) daily. (Patient not taking: Reported on 02/17/2021)   oxyCODONE-acetaminophen (PERCOCET/ROXICET) 5-325 MG tablet oxycodone-acetaminophen 5 mg-325 mg tablet  Take 1 tablet every 6 hours by oral route as needed for 7 days. (Patient not taking: Reported on 02/17/2021)   potassium chloride SA (KLOR-CON) 20 MEQ tablet Take 1 tablet (20 mEq total) by mouth daily.   predniSONE (DELTASONE) 10 MG tablet Take 1 tablet (10 mg total) by mouth    Care Management    RN Visit Note  02/26/2021 Name: Kim Anthony [name]: [id number] DOB: 03/02/1951  Subjective: Kim Anthony is a 70 y.o. year old female who is a primary care patient of Coe, Benjamin, MD. The care management team was consulted for assistance with disease management and care coordination needs.    Engaged with patient by telephone for follow up visit in response to provider referral for case management and/or care coordination services.   Consent to Services:   [name] was given information about Care Management services today including:  Care Management services includes personalized support from designated clinical staff supervised by her physician, including individualized plan of care and coordination with other care providers 24/7 contact phone numbers for assistance for urgent and routine care needs. The patient may stop case management services at any time by phone call to the office staff.  Patient agreed to services and consent obtained.   Assessment: Review of patient past medical history, allergies, medications, health status, including review of consultants reports, laboratory and other test data, was performed as part of comprehensive evaluation and provision of chronic care management services.   SDOH (Social Determinants of Health) assessments and interventions performed:    Care Plan  Allergies  Allergen Reactions   Lisinopril Swelling   Hydrocodone-Acetaminophen Itching   Acetaminophen-Codeine Itching    Other reaction(s): Other (See Comments)   Propoxyphene Itching    Darvocet   Tape Rash    Plastic   Tramadol Itching, Nausea And Vomiting and Rash    Outpatient Encounter Medications as of 02/26/2021  Medication Sig   albuterol (PROVENTIL) (2.5 MG/3ML) 0.083% nebulizer solution USE 1 VIAL IN NEBULIZER EVERY 6 HOURS AS NEEDED FOR WHEEZING FOR SHORTNESS OF BREATH   albuterol (VENTOLIN HFA) 108 (90 Base) MCG/ACT inhaler INHALE 2 PUFFS BY MOUTH EVERY 6  HOURS AS NEEDED FOR WHEEZING FOR SHORTNESS OF BREATH   aspirin 81 MG chewable tablet Chew 1 tablet (81 mg total) by mouth daily.   FARXIGA 10 MG TABS tablet Take 1 tablet by mouth once daily   fluticasone (FLONASE) 50 MCG/ACT nasal spray Place 1 spray into both nostrils daily as needed for allergies.   furosemide (LASIX) 40 MG tablet Take 1 tablet (40 mg total) by mouth daily.   gabapentin (NEURONTIN) 600 MG tablet Take 1.5 tablets (900 mg total) by mouth 3 (three) times daily. For nerve pain- increased to get her off lyrica   hydrALAZINE (APRESOLINE) 25 MG tablet Take 1 tablet (25 mg total) by mouth 3 (three) times daily.   HYDROcodone-acetaminophen (NORCO) 5-325 MG tablet Take 1 tablet by mouth every 4 (four) hours as needed for moderate pain.   isosorbide mononitrate (IMDUR) 30 MG 24 hr tablet Take 1 tablet by mouth once daily   metoprolol succinate (TOPROL-XL) 25 MG 24 hr tablet Take 1 tablet (25 mg total) by mouth daily.   nicotine (NICODERM CQ - DOSED IN MG/24 HR) 7 mg/24hr patch Place 2 patches (14 mg total) onto the skin daily for 120 days, THEN 1 patch (7 mg total) daily. (Patient not taking: Reported on 02/17/2021)   oxyCODONE-acetaminophen (PERCOCET/ROXICET) 5-325 MG tablet oxycodone-acetaminophen 5 mg-325 mg tablet  Take 1 tablet every 6 hours by oral route as needed for 7 days. (Patient not taking: Reported on 02/17/2021)   potassium chloride SA (KLOR-CON) 20 MEQ tablet Take 1 tablet (20 mEq total) by mouth daily.   predniSONE (DELTASONE) 10 MG tablet Take 1 tablet (10 mg total) by mouth

## 2021-02-27 ENCOUNTER — Telehealth (HOSPITAL_COMMUNITY): Payer: Self-pay | Admitting: Licensed Clinical Social Worker

## 2021-02-27 ENCOUNTER — Telehealth: Payer: Self-pay

## 2021-02-27 DIAGNOSIS — M79671 Pain in right foot: Secondary | ICD-10-CM | POA: Diagnosis not present

## 2021-02-27 DIAGNOSIS — L03115 Cellulitis of right lower limb: Secondary | ICD-10-CM | POA: Diagnosis not present

## 2021-02-27 NOTE — Telephone Encounter (Signed)
8:31am - HF inpatient CSW received a call from [place] at the [facility] [phone] regarding 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 and let her know that someone from the [facility] will be in touch shortly. CSW thanked [place] for the assistance. CSW attempted to call [name] however she did not answer the phone and CSW could not leave a voicemail due to no mailbox set up. CSW sent an in-basket message to the HF outpatient CSW regarding [name].  Kim Anthony, MSW, [place] [facility] Social Worker

## 2021-02-27 NOTE — Progress Notes (Signed)
Heart and Vascular Care Navigation  02/27/2021  Kim Anthony April 20, 1951 [id number]  Reason for Referral: Concern with paying utility bills   Engaged with patient by telephone for initial visit for Heart and Vascular Care Coordination.                                                                                                   Assessment:    Pt reports that she is normally able to pay bills reliably but is currently behind on his electric bill of $149 because she got a $200 speeding ticket this month she had to pay so she did not have the funds to pay her electric bill.                                  HRT/VAS Care Coordination     Living arrangements for the past 2 months Single Family Home   Lives with: Adult Children   Patient Current Insurance Coverage Managed Medicare; Medicaid   Patient Has Concern With Paying Medical Bills No   Does Patient Have Prescription Coverage? Yes   Home Assistive Devices/Equipment Cane (specify quad or straight)   DME [facility]   Current home services DME  cane, nebulizer machine, portable nebulizer machine       Social History:                                                                             SDOH Screenings   Alcohol Screen: Not on file  Depression (PHQ2-9): Low Risk    PHQ-2 Score: 0  Financial Resource Strain: Medium Risk   Difficulty of Paying Living Expenses: Somewhat hard  Food Insecurity: Landscape architect Present   Worried About Charity fundraiser in the Last Year: Never true   Ran Out of Food in the Last Year: Sometimes true  Housing: Low Risk    Last Housing Risk Score: 0  Physical Activity: Not on file  Social Connections: Not on file  Stress: Not on file  Tobacco Use: High Risk   Smoking Tobacco Use: Every Day   Smokeless Tobacco Use: Never  Transportation Needs: No Transportation Needs   Lack of Transportation (Medical): No   Lack of Transportation (Non-Medical): No    SDOH  Interventions: Financial Resources:  Financial Strain Interventions: Other (Comment) (patient care fund)   Food Insecurity:   Some concerns with continuing to pay for food- patient application in with [facility] for assistance with applying for food stamps and they will be following up with pt to assist with application  Housing Insecurity:   None reported  Transportation:    No concerns expressed   Follow-up plan:    CSW  able to pay for [employer] bill through [facility] [place]- pt reports no further concerns at this time.  Jorge Ny, LCSW Clinical Social Worker [facility] Desk#: [phone] Cell#: [phone]

## 2021-03-02 NOTE — Progress Notes (Deleted)
Office Visit Note  Patient: Kim Anthony             Date of Birth: 26-Mar-1951           [name]: [id number]             PCP: Marianna Payment, MD Referring: Marianna Payment, MD Visit Date: 03/03/2021   Subjective:  No chief complaint on file.   History of Present Illness: Kim Anthony is a 70 y.o. female here for follow up for seronegative inflammatory arthritis with inflammation of the right hand and wrist after trial of low dose prednisone 10 mg daily after initial visit. Labs showed significantly elevated sedimentation rate and mildly high immunoglobulins otherwise unremarkable.***   Previous HPI 02/17/21 [name] is a 70 y.o. female here for inflammatory arthritis with right wrist radial extensor tendon inflammation. Symptoms started fairly acutely in November 2021 without preceding medical changes she can recall. Local steroid injection has not provided a long lasting benefit. She stopped taking celebrex with concerns on anticoagulation for mechanical prosthetic valve. She has felt a large improvement with steroid treatment intermittently.  She wears a rigid wrist brace for support that is partially helpful.  Otherwise when not on treatment has symptoms every day persistent swelling.  She has noticed increased development of swelling in the proximal finger joints on the right hand with some warmth and redness.  She is very stiff in the morning but also reports increased pain with use.  MRI of the right wrist obtained about 2 weeks ago demonstrated changes of synovitis and tenosynovitis as well as possible erosive changes.  Outside of the right hand and wrist she has had some chronic pain in the knees with episodes of swelling.  These were treated in orthopedics clinic with aspiration and injection has had good improvement in symptoms.  X-ray of the left knee did not show significant osteoarthritis change     Labs reviewed 01/2021 CBC WBC 11.4 Plts 438 CMP eGFR 60 HBV neg HCV neg    12/2020 ANA neg RF neg CCP neg ESR 28 CRP 1.5 Uric acid 10.7 LFTs AST 179 ALT 121   11/2020 Uric acid 9.9   Imaging reviewed 02/04/21 MRI Right wrist Radiocarpal, intercarpal, and distal radioulnar joint effusion with synovitis. Mild tenosynovitis of the second and third extensor compartments near the distal intersection and of the fourth through sixth extensor compartments at the level of the distal carpal row. Possible erosive change of the proximal volar aspect of the triquetrum. Findings can be seen in inflammatory arthritis. Central perforation of the TFCC articular disc. Ulnar subluxation of the extensor carpi ulnaris tendon consistent with ECU subsheath tear.   09/26/20 Xray left knee Moderate knee joint effusion. No fracture or dislocation. No appreciable underlying joint space narrowing or erosion.  No Rheumatology ROS completed.   PMFS History:  Patient Active Problem List   Diagnosis Date Noted   Seronegative inflammatory arthritis 02/17/2021   High risk medication use 02/17/2021   Elevated transaminase level 01/27/2021   Respiratory distress    Dyspnea 01/05/2021   Acute idiopathic gout of multiple sites 09/30/2020   Eye redness 02/15/2020   Chronic pain syndrome 01/17/2020   Monoarthritis of wrist 10/18/2019   Ganglion cyst 09/21/2019   Hip pain 08/23/2019   Thoracic ascending aortic aneurysm ([place]) 07/05/2019   Pustular rash 03/09/2019   Anemia 02/16/2019   Breast cancer ([place])    Acquired absence of left breast 12/20/2018   Open wound of axillary region 05/11/2018  COPD (chronic obstructive pulmonary disease) ([place]) 11/25/2017   S/P MVR (mitral valve replacement)    Chronic anticoagulation    Tobacco use disorder 02/25/2017   Lymphadenopathy, mediastinal 02/25/2017   History of mitral valve replacement with mechanical valve 11/18/2016   Essential hypertension 02/01/2016   Allergic rhinitis 02/01/2016   Healthcare maintenance 01/30/2016   Chronic low back  pain 07/22/2015   Pulmonary embolism ([place]) 07/11/2015   Chronic combined systolic and diastolic heart failure ([place]) 07/11/2015   Headache, common migraine 07/11/2015   HLD (hyperlipidemia) 07/11/2015    Past Medical History:  Diagnosis Date   Arthritis    Asthma    Breast cancer ([place]) 2001   Left Breast Cancer   CHF (congestive heart failure) (H[facility])    COPD (chronic obstructive pulmonary disease) (H[facility])    Coronary artery disease    History of mitral valve replacement with mechanical valve    HLD (hyperlipidemia)    Hypertension    Pulmonary embolism ([place])     Family History  Problem Relation Age of Onset   Hypertension Mother    Cancer Father    Hypertension Father    Breast cancer Maternal Aunt 50   Heart attack Other    Past Surgical History:  Procedure Laterality Date   APPLICATION OF A-CELL OF CHEST/ABDOMEN Left 01/27/2019   Procedure: APPLICATION OF A-CELL OF CHEST;  Surgeon: Wallace Going, DO;  Location: [facility];  Service: Plastics;  Laterality: Left;   APPLICATION OF WOUND VAC N/A 01/27/2019   Procedure: APPLICATION OF WOUND VAC;  Surgeon: Wallace Going, DO;  Location: [facility];  Service: Plastics;  Laterality: N/A;   COLONOSCOPY WITH PROPOFOL N/A 06/07/2018   Procedure: COLONOSCOPY WITH PROPOFOL;  Surgeon: Wilford Corner, MD;  Location: [facility];  Service: Endoscopy;  Laterality: N/A;   DEBRIDEMENT AND CLOSURE WOUND Left 12/28/2018   Procedure: Debridement And Closure Wound;  Surgeon: Coralie Keens, MD;  Location: [facility];  Service: General;  Laterality: Left;   INCISION AND DRAINAGE OF WOUND Left 01/27/2019   Procedure: IRRIGATION AND DEBRIDEMENT OF CHEST WALL;  Surgeon: Wallace Going, DO;  Location: [facility];  Service: Plastics;  Laterality: Left;   LATISSIMUS FLAP TO BREAST Left 01/18/2019   Procedure: LEFT LATISSIMUS FLAP TO BREAST;  Surgeon: Wallace Going, DO;  Location: [facility];  Service: Plastics;  Laterality: Left;   MASTECTOMY Left  2000s   for breast cancer, also s/p radiation and chemo   MITRAL VALVE REPLACEMENT     mechanical   POLYPECTOMY  06/07/2018   Procedure: POLYPECTOMY;  Surgeon: Wilford Corner, MD;  Location: [facility];  Service: Endoscopy;;   RIGHT/LEFT HEART CATH AND CORONARY ANGIOGRAPHY N/A 01/09/2021   Procedure: RIGHT/LEFT HEART CATH AND CORONARY ANGIOGRAPHY;  Surgeon: Jolaine Artist, MD;  Location: [facility];  Service: Cardiovascular;  Laterality: N/A;   WOUND DEBRIDEMENT Left 12/28/2018   Procedure: EXCISION OF LEFT CHRONIC CHEST WALL WOUND;  Surgeon: Coralie Keens, MD;  Location: [facility];  Service: General;  Laterality: Left;   Social History   Social History Narrative   ** Merged History Encounter **       Immunization History  Administered Date(s) Administered   Influenza,inj,Quad PF,6+ Mos 07/14/2015, 05/27/2017, 03/24/2018, 03/09/2019   Moderna SARS-COV2 Booster Vaccination 04/24/2020   Moderna Sars-Covid-2 Vaccination 08/31/2019, 10/10/2019   Pneumococcal Conjugate-13 11/09/2017   Pneumococcal Polysaccharide-23 11/12/2020   Tdap 01/30/2016     Objective: Vital Signs: There were no vitals taken for this  visit.   Physical Exam   Musculoskeletal Exam: ***  CDAI Exam: CDAI Score: -- Patient Global: --; Provider Global: -- Swollen: --; Tender: -- Joint Exam 03/03/2021   No joint exam has been documented for this visit   There is currently no information documented on the homunculus. Go to the Rheumatology activity and complete the homunculus joint exam.  Investigation: No additional findings.  Imaging: MR WRIST RIGHT WO CONTRAST  Result Date: 02/04/2021 CLINICAL DATA:  Right wrist pain EXAM: MR OF THE RIGHT WRIST WITHOUT CONTRAST TECHNIQUE: Multiplanar, multisequence MR imaging of the right wrist was performed. No intravenous contrast was administered. COMPARISON:  Wrist CT 08/22/2020 FINDINGS: Ligaments: Intact scapholunate and lunotriquetral ligaments.  Triangular fibrocartilage: Central perforation of the TFCC articular disc. Tendons: Tenosynovitis of the second and third extensor compartments near the distal intersection. Mild tenosynovitis of the fourth, fifth, and sixth extensor compartments at the level of the distal carpal row. Ulnar subluxation of the extensor carpi ulnaris tendon with adjacent edema. Carpal tunnel/median nerve: Flexor retinaculum is intact. Normal carpal tunnel without a mass. Median nerve demonstrates normal signal and caliber. Guyon's canal: Normal Guyon's canal. Normal ulnar nerve. Joint/cartilage: Mild radiocarpal and intercarpal effusion with synovitis. Distension of the distal radioulnar joint with synovitis. Bones/carpal alignment: Possible erosive change at the proximal volar aspect of the triquetrum (axial T1 image 14). Other: No muscle atrophy.  No soft tissue mass. IMPRESSION: Radiocarpal, intercarpal, and distal radioulnar joint effusion with synovitis. Mild tenosynovitis of the second and third extensor compartments near the distal intersection and of the fourth through sixth extensor compartments at the level of the distal carpal row. Possible erosive change of the proximal volar aspect of the triquetrum. Findings can be seen in inflammatory arthritis. Central perforation of the TFCC articular disc. Ulnar subluxation of the extensor carpi ulnaris tendon consistent with ECU subsheath tear. Electronically Signed   By: Maurine Simmering M.D.   On: 02/04/2021 14:19    Recent Labs: Lab Results  Component Value Date   WBC 11.4 (H) 02/03/2021   HGB 13.3 02/03/2021   PLT 438 (H) 02/03/2021   NA 134 (L) 02/03/2021   K 4.4 02/03/2021   CL 100 02/03/2021   CO2 22 02/03/2021   GLUCOSE 104 (H) 02/03/2021   BUN 27 (H) 02/03/2021   CREATININE 1.01 (H) 02/03/2021   BILITOT 0.7 02/03/2021   ALKPHOS 107 02/03/2021   AST 39 02/03/2021   ALT 29 02/03/2021   PROT 8.3 (H) 02/03/2021   ALBUMIN 3.9 02/03/2021   CALCIUM 10.0 02/03/2021    GFRAA 104 02/28/2020   QFTBGOLDPLUS NEGATIVE 02/17/2021    Speciality Comments: No specialty comments available.  Procedures:  No procedures performed Allergies: Lisinopril, Hydrocodone-acetaminophen, Acetaminophen-codeine, Propoxyphene, Tape, and Tramadol   Assessment / Plan:     Visit Diagnoses: No diagnosis found.  ***  Orders: No orders of the defined types were placed in this encounter.  No orders of the defined types were placed in this encounter.    Follow-Up Instructions: No follow-ups on file.   Collier Salina, MD  Note - This record has been created using [employer].  Chart creation errors have been sought, but may not always  have been located. Such creation errors do not reflect on  the standard of medical care.

## 2021-03-03 ENCOUNTER — Ambulatory Visit: Payer: Medicare Other | Admitting: Internal Medicine

## 2021-03-04 DIAGNOSIS — M79641 Pain in right hand: Secondary | ICD-10-CM | POA: Diagnosis not present

## 2021-03-04 DIAGNOSIS — M064 Inflammatory polyarthropathy: Secondary | ICD-10-CM | POA: Insufficient documentation

## 2021-03-05 ENCOUNTER — Other Ambulatory Visit: Payer: Self-pay | Admitting: Internal Medicine

## 2021-03-05 ENCOUNTER — Telehealth: Payer: Self-pay | Admitting: Physical Medicine and Rehabilitation

## 2021-03-05 DIAGNOSIS — J449 Chronic obstructive pulmonary disease, unspecified: Secondary | ICD-10-CM

## 2021-03-05 NOTE — Telephone Encounter (Signed)
See medication refill note regarding hydrocodone [phone]

## 2021-03-07 DIAGNOSIS — M79671 Pain in right foot: Secondary | ICD-10-CM | POA: Diagnosis not present

## 2021-03-07 MED ORDER — HYDROCODONE-ACETAMINOPHEN 5-325 MG PO TABS: 1.0000 | ORAL_TABLET | ORAL | 0 refills | Status: DC | PRN

## 2021-03-07 NOTE — Telephone Encounter (Signed)
Dr Dagoberto Ligas note was reviewed.  PMP was reviewed.  Last office visit was in April/ 2022. Patient educated on the narcotic policy , she was educated not to self prescribe this could lead to being discharge from office, she verbalizes understanding.  She has a F/U appointment with Dr Dagoberto Ligas in October, she was instructed to keep her scheduled appointment with Dr Dagoberto Ligas, she verbalizes understanding.

## 2021-03-07 NOTE — Addendum Note (Signed)
Addended by: Bayard Hugger on: 03/07/2021 01:59 PM   Modules accepted: Orders

## 2021-03-07 NOTE — Telephone Encounter (Signed)
Patient is called requesting a refill on Hydrocodone. According to PMP patient last filled a 25 day supply on 01/21/21. Today is day 25. Can you refill medication in Dr. Florentina Jenny absence?

## 2021-03-08 NOTE — Progress Notes (Signed)
Advanced Heart Failure Clinic Note   PCP: Marianna Payment, MD HF Cardiologist: Dr. Haroldine Laws  HPI: Kim Anthony is a 70 y.o. female with a history of mechanical MVR in 2001 in [place], [place], hx breast cancer s/p left mastectomy and radiation/chemo, tobacco use, COPD, prior PE/DVT, HTN, undergoing workup for possible inflammatory arthritis with Rheumatology, prior CABG (she is unaware of prior CAD), and systolic CHF.   EF (2017) 45-50% (no EF on file prior to this). Had improved to 55-60% on Echo in July 2020.  She was admitted 01/05/21 with A/C CHF exacerbation and likely COPD exacerbation. Received steroids, bronchodilators, and 40 mg lasix IV.  Echo with EF of 35-40%, previously 55-60% on echo July 2020. No significant MR. Mildly enlarged RV. Etiology for decline in EF was not certain so she was scheduled for [facility] to rule out obstructive CAD. R/LHC showed 1v CAD with occluded mRCA and stable hemodynamics. IV Lasix was transitioned to PO 80 mg bid, and she was started on GDMT; no ACEi or ARNi due to history of angioedema. Hospitalization c/b transaminitis. + Hep B E antigen and low levels of Hep B surface antibody, but Hep B antigen negative. Her atorvastatin was decreased to 40 mg and Hep B DNA pending at discharge. She was bridged with Lovenox until her INR>2.5 and discharged home; discharge weight 177 lbs.   Today she returns for HF follow up. She can walk on flat ground without SOB. Major complaint is arthritic pain in hands. She has seem Rheumatologist and finished steroid. Overall feeling fine. Denies CP, dizziness, edema, or PND/Orthopnea. Appetite ok. No fever or chills. Weight at home 175 pounds. Taking all medications. Drinking ETOH on weekends, down smoking 5 cigarettes/day.  Cardiac Studies: - Echo (2020): EF 55-60% - Echo (7/22): EF 35-40% (Dr. Jerilee Field felt EF 45%) - R/LHC (7/22): with 1v CAD with occluded mRCA, RA 9, PCW 14, CI 2.9.   Past Medical History:  Diagnosis Date    Arthritis    Asthma    Breast cancer ([place]) 2001   Left Breast Cancer   CHF (congestive heart failure) (HCC)    COPD (chronic obstructive pulmonary disease) (HCC)    Coronary artery disease    History of mitral valve replacement with mechanical valve    HLD (hyperlipidemia)    Hypertension    Pulmonary embolism (H[facility])    Current Outpatient Medications  Medication Sig Dispense Refill   albuterol (PROVENTIL) (2.5 MG/3ML) 0.083% nebulizer solution USE 1 VIAL IN NEBULIZER EVERY 6 HOURS AS NEEDED FOR WHEEZING FOR SHORTNESS OF BREATH 90 mL 2   albuterol (VENTOLIN HFA) 108 (90 Base) MCG/ACT inhaler INHALE 2 PUFFS BY MOUTH EVERY 6 HOURS AS NEEDED FOR WHEEZING FOR SHORTNESS OF BREATH 9 g 0   FARXIGA 10 MG TABS tablet Take 1 tablet by mouth once daily 30 tablet 0   fluticasone (FLONASE) 50 MCG/ACT nasal spray Place 1 spray into both nostrils daily as needed for allergies. 9.9 mL 11   furosemide (LASIX) 40 MG tablet Take 1 tablet (40 mg total) by mouth daily. 30 tablet 2   gabapentin (NEURONTIN) 600 MG tablet Take 1.5 tablets (900 mg total) by mouth 3 (three) times daily. For nerve pain- increased to get her off lyrica 150 tablet 5   hydrALAZINE (APRESOLINE) 25 MG tablet Take 1 tablet (25 mg total) by mouth 3 (three) times daily. 90 tablet 11   HYDROcodone-acetaminophen (NORCO) 5-325 MG tablet Take 1 tablet by mouth every 4 (four) hours as needed  for moderate pain. No more than 5 times  a day 150 tablet 0   isosorbide mononitrate (IMDUR) 30 MG 24 hr tablet Take 1 tablet by mouth once daily 30 tablet 0   metoprolol succinate (TOPROL-XL) 25 MG 24 hr tablet Take 1 tablet (25 mg total) by mouth daily. 30 tablet 0   potassium chloride SA (KLOR-CON) 20 MEQ tablet Take 1 tablet (20 mEq total) by mouth daily. 30 tablet 6   predniSONE (DELTASONE) 10 MG tablet Take 1 tablet (10 mg total) by mouth daily with breakfast. 15 tablet 0   simvastatin (ZOCOR) 20 MG tablet Take 1 tablet by mouth once daily 90 tablet 0    Spacer/Aero-Holding Chambers (BREATHERITE COLL SPACER ADULT) MISC 1 applicator by Does not apply route daily. 1 each 0   spironolactone (ALDACTONE) 25 MG tablet Take 1 tablet (25 mg total) by mouth daily. 30 tablet 6   STIOLTO RESPIMAT 2.5-2.5 MCG/ACT AERS INHALE 2 PUFFS BY MOUTH ONCE DAILY 4 g 0   warfarin (COUMADIN) 5 MG tablet Take 1 tablet (5 mg total) by mouth one time only at 4 PM. 30 tablet 2   nicotine (NICODERM CQ - DOSED IN MG/24 HR) 7 mg/24hr patch Place 2 patches (14 mg total) onto the skin daily for 120 days, THEN 1 patch (7 mg total) daily. (Patient not taking: Reported on 03/11/2021) 300 patch 0   No current facility-administered medications for this encounter.   Allergies  Allergen Reactions   Lisinopril Swelling   Hydrocodone-Acetaminophen Itching   Acetaminophen-Codeine Itching    Other reaction(s): Other (See Comments)   Propoxyphene Itching    Darvocet   Tape Rash    Plastic   Tramadol Itching, Nausea And Vomiting and Rash   Social History   Socioeconomic History   Marital status: Single    Spouse name: Not on file   Number of children: Not on file   Years of education: Not on file   Highest education level: Not on file  Occupational History   Not on file  Tobacco Use   Smoking status: Every Day    Packs/day: 0.10    Years: 30.00    Pack years: 3.00    Types: Cigarettes   Smokeless tobacco: Never   Tobacco comments:    Sometimes less. 2 per day  Vaping Use   Vaping Use: Never used  Substance and Sexual Activity   Alcohol use: Yes    Comment: beer- ocasional   Drug use: No   Sexual activity: Not Currently    Birth control/protection: None  Other Topics Concern   Not on file  Social History Narrative   ** Merged History Encounter **       Social Determinants of Health   Financial Resource Strain: Medium Risk   Difficulty of Paying Living Expenses: Somewhat hard  Food Insecurity: Landscape architect Present   Worried About Charity fundraiser in  the Last Year: Never true   Ran Out of Food in the Last Year: Sometimes true  Transportation Needs: No Transportation Needs   Lack of Transportation (Medical): No   Lack of Transportation (Non-Medical): No  Physical Activity: Not on file  Stress: Not on file  Social Connections: Not on file  Intimate Partner Violence: Not on file    Family History  Problem Relation Age of Onset   Hypertension Mother    Cancer Father    Hypertension Father    Breast cancer Maternal Aunt 50   Heart attack  Other    BP 98/69   Pulse 70   Wt 79.6 kg   SpO2 96%   BMI 29.19 kg/m   Wt Readings from Last 3 Encounters:  03/11/21 79.6 kg  02/17/21 78.9 kg  02/13/21 79.8 kg   PHYSICAL EXAM: General:  NAD. No resp difficulty HEENT: Normal Neck: Supple. No JVD. Carotids 2+ bilat; no bruits. No lymphadenopathy or thryomegaly appreciated. Cor: PMI nondisplaced. Regular rate & rhythm. No rubs, gallops or murmurs, + mechanical S1 Lungs: Clear Abdomen: Soft, nontender, nondistended. No hepatosplenomegaly. No bruits or masses. Good bowel sounds. Extremities: No cyanosis, clubbing, rash, edema; right wrist brace in place Neuro: Alert & oriented x 3, cranial nerves grossly intact. Moves all 4 extremities w/o difficulty. Affect pleasant.  ReDs: 28%  ASSESSMENT & PLAN: Chronic combined CHF: - Echo (2017): EF 45-50% in setting of PE. Has mild to moderate RV dysfunction. MV prosthesis okay. - Echo (7/22): EF 35-40% this admit (Dr. Jerilee Field felt EF 45%), previously 55-60% in 2020.  - R/LHC (01/09/21): with 1v CAD mRCA chronically occluded and well compensated hemodynamics. - NYHA II, volume good today, ReDs 28%. - Decrease lasix to 20 mg daily. - Continue spiro 25 mg daily. - Continue Toprol XL 25 mg daily. - Continue hydralazine 25 mg tid + Imdur 30 mg daily. - Continue Farxiga 10 mg daily. - H/o angioedema with ACE-I, so no ARNi either. - BMET today.  2. CAD  - 1 V CAD with occluded mid RCA on cath  (7/22) - No CP. - On ASA + beta blocker + statin.     3. Hx of mechanical mitral valve replacement: - Mechanical MVR in [place] in 2001. - Echo (7/22) mean gradient of 4 mmHg with no significant MR. - Continue aspirin. - Continue Coumadin.    4. Tricuspid valve regurgitation: - Moderate by echo.   5. Hx DVT/PE: - On Coumadin. - INRs followed by Coumadin Clinic.   6. HTN: - Stable. - Continue current regimen.  7. COPD - Continue inhalers. - No wheezes on exam.   8. Smoker: - Smoking cessation discussed. - Nicotine patches not covered by Medicaid. - Discussed with Pharmacy Tech, OTC cash price $15  Follow up in 2-3 months with Dr. Haroldine Laws + echo.   [place], FNP 03/11/21

## 2021-03-11 ENCOUNTER — Telehealth (HOSPITAL_COMMUNITY): Payer: Self-pay | Admitting: Pharmacy Technician

## 2021-03-11 ENCOUNTER — Other Ambulatory Visit (HOSPITAL_COMMUNITY): Payer: Self-pay

## 2021-03-11 ENCOUNTER — Other Ambulatory Visit: Payer: Self-pay

## 2021-03-11 ENCOUNTER — Ambulatory Visit (HOSPITAL_COMMUNITY)
Admission: RE | Admit: 2021-03-11 | Discharge: 2021-03-11 | Disposition: A | Discharge status: Home / Self Care | Payer: Medicare Other | Source: Ambulatory Visit | Attending: Family Medicine | Admitting: Family Medicine

## 2021-03-11 ENCOUNTER — Encounter (HOSPITAL_COMMUNITY): Payer: Self-pay

## 2021-03-11 VITALS — BP 98/69 | HR 70 | Wt 175.4 lb

## 2021-03-11 DIAGNOSIS — I110 Hypertensive heart disease with heart failure: Secondary | ICD-10-CM | POA: Insufficient documentation

## 2021-03-11 DIAGNOSIS — I071 Rheumatic tricuspid insufficiency: Secondary | ICD-10-CM | POA: Diagnosis not present

## 2021-03-11 DIAGNOSIS — I5042 Chronic combined systolic (congestive) and diastolic (congestive) heart failure: Secondary | ICD-10-CM | POA: Diagnosis not present

## 2021-03-11 DIAGNOSIS — Z8249 Family history of ischemic heart disease and other diseases of the circulatory system: Secondary | ICD-10-CM | POA: Diagnosis not present

## 2021-03-11 DIAGNOSIS — Z952 Presence of prosthetic heart valve: Secondary | ICD-10-CM | POA: Diagnosis not present

## 2021-03-11 DIAGNOSIS — Z7952 Long term (current) use of systemic steroids: Secondary | ICD-10-CM | POA: Diagnosis not present

## 2021-03-11 DIAGNOSIS — I2510 Atherosclerotic heart disease of native coronary artery without angina pectoris: Secondary | ICD-10-CM

## 2021-03-11 DIAGNOSIS — Z888 Allergy status to other drugs, medicaments and biological substances status: Secondary | ICD-10-CM | POA: Diagnosis not present

## 2021-03-11 DIAGNOSIS — Z720 Tobacco use: Secondary | ICD-10-CM | POA: Diagnosis not present

## 2021-03-11 DIAGNOSIS — Z7901 Long term (current) use of anticoagulants: Secondary | ICD-10-CM | POA: Diagnosis not present

## 2021-03-11 DIAGNOSIS — Z86711 Personal history of pulmonary embolism: Secondary | ICD-10-CM | POA: Diagnosis not present

## 2021-03-11 DIAGNOSIS — Z86718 Personal history of other venous thrombosis and embolism: Secondary | ICD-10-CM | POA: Insufficient documentation

## 2021-03-11 DIAGNOSIS — F17210 Nicotine dependence, cigarettes, uncomplicated: Secondary | ICD-10-CM | POA: Diagnosis not present

## 2021-03-11 DIAGNOSIS — Z79899 Other long term (current) drug therapy: Secondary | ICD-10-CM | POA: Diagnosis not present

## 2021-03-11 DIAGNOSIS — J449 Chronic obstructive pulmonary disease, unspecified: Secondary | ICD-10-CM | POA: Diagnosis not present

## 2021-03-11 DIAGNOSIS — Z7984 Long term (current) use of oral hypoglycemic drugs: Secondary | ICD-10-CM | POA: Insufficient documentation

## 2021-03-11 DIAGNOSIS — Z923 Personal history of irradiation: Secondary | ICD-10-CM | POA: Diagnosis not present

## 2021-03-11 DIAGNOSIS — Z885 Allergy status to narcotic agent status: Secondary | ICD-10-CM | POA: Insufficient documentation

## 2021-03-11 DIAGNOSIS — I10 Essential (primary) hypertension: Secondary | ICD-10-CM | POA: Diagnosis not present

## 2021-03-11 DIAGNOSIS — Z9012 Acquired absence of left breast and nipple: Secondary | ICD-10-CM | POA: Insufficient documentation

## 2021-03-11 DIAGNOSIS — I081 Rheumatic disorders of both mitral and tricuspid valves: Secondary | ICD-10-CM | POA: Insufficient documentation

## 2021-03-11 LAB — BASIC METABOLIC PANEL
Anion gap: 12 (ref 5–15)
BUN: 30 mg/dL — ABNORMAL HIGH (ref 8–23)
CO2: 18 mmol/L — ABNORMAL LOW (ref 22–32)
Calcium: 9.9 mg/dL (ref 8.9–10.3)
Chloride: 102 mmol/L (ref 98–111)
Creatinine, Ser: 1.11 mg/dL — ABNORMAL HIGH (ref 0.44–1.00)
GFR, Estimated: 53 mL/min — ABNORMAL LOW (ref >60)
Glucose, Bld: 97 mg/dL (ref 70–99)
Potassium: 4.3 mmol/L (ref 3.5–5.1)
Sodium: 132 mmol/L — ABNORMAL LOW (ref 135–145)

## 2021-03-11 MED ORDER — FUROSEMIDE 40 MG PO TABS: 20.0000 mg | ORAL_TABLET | Freq: Every day | ORAL | 2 refills | Status: DC

## 2021-03-11 NOTE — Progress Notes (Signed)
ReDS Vest / Clip - 03/11/21 1000       ReDS Vest / Clip   Station Marker A    Ruler Value 25.5    ReDS Value Range Low volume    ReDS Actual Value 28

## 2021-03-11 NOTE — Telephone Encounter (Signed)
Advanced Heart Failure Patient Advocate Encounter  Patient was seen in clinic today and expressed affordability concerns regarding Nicotine patches. Upon further review, the patient's Medicare insurance does not cover Nicotine patches. I was able to confirm that the patient could get Nicotine patches at [facility] outpatient for about $28 with an RX. She can also buy them OTC for about $15.  Called and spoke with the patient's daughter.  Charlann Boxer, CPhT

## 2021-03-11 NOTE — Patient Instructions (Signed)
Labs were perfomred today, if any labs were abnormal the clinic will call you  Your physician recommends that you schedule a follow-up appointment in: 2-3 months with echocardiogram   DECREASE Lasix to 20 mg (0.5 tablet) daily  At the [facility], you and your health needs are our priority. As part of our continuing mission to provide you with exceptional heart care, we have created designated Provider Care Teams. These Care Teams include your primary Cardiologist (physician) and Advanced Practice Providers (APPs- Physician Assistants and Nurse Practitioners) who all work together to provide you with the care you need, when you need it.   You may see any of the following providers on your designated Care Team at your next follow up: Dr Glori Bickers Dr Loralie Champagne Dr Patrice Paradise, NP Lyda Jester, [place] Ginnie Smart Audry Riles, PharmD   Please be sure to bring in all your medications bottles to every appointment.    If you have any questions or concerns before your next appointment please send [place] a message through [place] or call our office at [phone].    TO LEAVE A MESSAGE FOR THE NURSE SELECT OPTION 2, PLEASE LEAVE A MESSAGE INCLUDING: YOUR NAME DATE OF BIRTH CALL BACK NUMBER REASON FOR CALL**this is important as we prioritize the call backs  YOU WILL RECEIVE A CALL BACK THE SAME DAY AS LONG AS YOU CALL BEFORE 4:00 PM

## 2021-03-12 ENCOUNTER — Other Ambulatory Visit: Payer: Self-pay | Admitting: Internal Medicine

## 2021-03-14 ENCOUNTER — Telehealth: Payer: Self-pay

## 2021-03-14 NOTE — Telephone Encounter (Signed)
RTC, patient states she has ran out of her allergy medication.  She c/o runny nose only.  States she has a RX for Cetirizine that always helps her.  This RX has fallen off of her med list.  She is requesting a RX w/ refills to have on hand for fall season. SChaplin, RN,BSN

## 2021-03-14 NOTE — Telephone Encounter (Signed)
Requesting to speak with a nurse about getting allergy medication. Please call pt back.

## 2021-03-18 ENCOUNTER — Other Ambulatory Visit: Payer: Self-pay | Admitting: Internal Medicine

## 2021-03-18 DIAGNOSIS — M21611 Bunion of right foot: Secondary | ICD-10-CM | POA: Diagnosis not present

## 2021-03-18 DIAGNOSIS — M21619 Bunion of unspecified foot: Secondary | ICD-10-CM | POA: Insufficient documentation

## 2021-03-19 ENCOUNTER — Other Ambulatory Visit: Payer: Self-pay | Admitting: Internal Medicine

## 2021-03-24 ENCOUNTER — Encounter
Payer: Medicare Other | Attending: Physical Medicine and Rehabilitation | Admitting: Physical Medicine and Rehabilitation

## 2021-03-24 ENCOUNTER — Other Ambulatory Visit: Payer: Self-pay

## 2021-03-24 ENCOUNTER — Encounter: Payer: Self-pay | Admitting: Physical Medicine and Rehabilitation

## 2021-03-24 ENCOUNTER — Ambulatory Visit (INDEPENDENT_AMBULATORY_CARE_PROVIDER_SITE_OTHER): Payer: Medicare Other | Admitting: Pharmacist

## 2021-03-24 VITALS — BP 113/74 | HR 77 | Temp 98.1°F | Ht 65.0 in | Wt 177.8 lb

## 2021-03-24 DIAGNOSIS — G8929 Other chronic pain: Secondary | ICD-10-CM | POA: Diagnosis not present

## 2021-03-24 DIAGNOSIS — Z7901 Long term (current) use of anticoagulants: Secondary | ICD-10-CM | POA: Diagnosis not present

## 2021-03-24 DIAGNOSIS — Z79891 Long term (current) use of opiate analgesic: Secondary | ICD-10-CM | POA: Insufficient documentation

## 2021-03-24 DIAGNOSIS — I2609 Other pulmonary embolism with acute cor pulmonale: Secondary | ICD-10-CM

## 2021-03-24 DIAGNOSIS — Z5181 Encounter for therapeutic drug level monitoring: Secondary | ICD-10-CM | POA: Insufficient documentation

## 2021-03-24 DIAGNOSIS — M5441 Lumbago with sciatica, right side: Secondary | ICD-10-CM | POA: Insufficient documentation

## 2021-03-24 DIAGNOSIS — G894 Chronic pain syndrome: Secondary | ICD-10-CM | POA: Insufficient documentation

## 2021-03-24 LAB — POCT INR: INR: 1.7 — AB (ref 2.0–3.0)

## 2021-03-24 MED ORDER — MONTELUKAST SODIUM 10 MG PO TABS: 10.0000 mg | ORAL_TABLET | Freq: Every day | ORAL | 1 refills | Status: DC

## 2021-03-24 MED ORDER — HYDROCODONE-ACETAMINOPHEN 5-325 MG PO TABS: 1.0000 | ORAL_TABLET | ORAL | 0 refills | Status: DC | PRN

## 2021-03-24 NOTE — Patient Instructions (Signed)
Patient instructed to take medications as defined in the Anti-coagulation Track section of this encounter.  Patient instructed to take today's dose.  Patient instructed to take one of your 5mg  peach colored warfarin tablets today, Tuesday and Wednesday. On Thursday 6-OCT-22, perform patient self testing, finger-stick, point of care INR.  Patient verbalized understanding of these instructions.

## 2021-03-24 NOTE — Addendum Note (Signed)
Addended by: Franchot Gallo on: 03/24/2021 11:18 AM   Modules accepted: Orders

## 2021-03-24 NOTE — Progress Notes (Signed)
Subjective:    Patient ID: Kim Anthony, female    DOB: 13-Mar-1951, 70 y.o.   [name]: [id number]  HPI Patient is a 70 yr old female with aortic stenosis s/p AVR on Coumadin,  HTN, hx of PE's on chronic coumadin , CHF, and COPD- here for f/u of R side low back  Pain with sciatica. Tramadol makes her ill as well as Tylenol #3 and Darvocet.  Here for f/u on chronic pain.   Due to drug screen today- will do oral since not walking well.   Right foot- has swollen real big- went to Ortho- was given PO ABX due to infection and something for swelling.   MRI showed soft tissue swelling, and had cellulitis- but no signs of gout on MRI.    Back pain about the same- cannot stand long- ~ 15 minutes- very hard to  wash dishes and lean over sink.   Still taking pain meds 5x/day- usually 4-5x/day,   Just got refill 9/16- didn't get til 03/12/21   Pain Inventory Average Pain 7 Pain Right Now 8 My pain is intermittent, sharp, stabbing, and aching  In the last 24 hours, has pain interfered with the following? General activity 8 Relation with others 5 Enjoyment of life 8 What TIME of day is your pain at its worst? morning , daytime, evening, night, and varies Sleep (in general) Poor  Pain is worse with: walking and some activites Pain improves with: rest and medication Relief from Meds: 7  Family History  Problem Relation Age of Onset   Hypertension Mother    Cancer Father    Hypertension Father    Breast cancer Maternal Aunt 50   Heart attack Other    Social History   Socioeconomic History   Marital status: Single    Spouse Anthony: Not on file   Number of children: Not on file   Years of education: Not on file   Highest education level: Not on file  Occupational History   Not on file  Tobacco Use   Smoking status: Every Day    Packs/day: 0.10    Years: 30.00    Pack years: 3.00    Types: Cigarettes   Smokeless tobacco: Never   Tobacco comments:    Sometimes less. 2 per day   Vaping Use   Vaping Use: Never used  Substance and Sexual Activity   Alcohol use: Yes    Comment: beer- ocasional   Drug use: No   Sexual activity: Not Currently    Birth control/protection: None  Other Topics Concern   Not on file  Social History Narrative   ** Merged History Encounter **       Social Determinants of Health   Financial Resource Strain: Medium Risk   Difficulty of Paying Living Expenses: Somewhat hard  Food Insecurity: Landscape architect Present   Worried About Charity fundraiser in the Last Year: Never true   Ran Out of Food in the Last Year: Sometimes true  Transportation Needs: No Transportation Needs   Lack of Transportation (Medical): No   Lack of Transportation (Non-Medical): No  Physical Activity: Not on file  Stress: Not on file  Social Connections: Not on file   Past Surgical History:  Procedure Laterality Date   APPLICATION OF A-CELL OF CHEST/ABDOMEN Left 01/27/2019   Procedure: APPLICATION OF A-CELL OF CHEST;  Surgeon: Wallace Going, DO;  Location: [place];  Service: Plastics;  Laterality: Left;   APPLICATION OF WOUND VAC  N/A 01/27/2019   Procedure: APPLICATION OF WOUND VAC;  Surgeon: Wallace Going, DO;  Location: [place];  Service: Plastics;  Laterality: N/A;   COLONOSCOPY WITH PROPOFOL N/A 06/07/2018   Procedure: COLONOSCOPY WITH PROPOFOL;  Surgeon: Wilford Corner, MD;  Location: [facility];  Service: Endoscopy;  Laterality: N/A;   DEBRIDEMENT AND CLOSURE WOUND Left 12/28/2018   Procedure: Debridement And Closure Wound;  Surgeon: Coralie Keens, MD;  Location: [place];  Service: General;  Laterality: Left;   INCISION AND DRAINAGE OF WOUND Left 01/27/2019   Procedure: IRRIGATION AND DEBRIDEMENT OF CHEST WALL;  Surgeon: Wallace Going, DO;  Location: [place];  Service: Plastics;  Laterality: Left;   LATISSIMUS FLAP TO BREAST Left 01/18/2019   Procedure: LEFT LATISSIMUS FLAP TO BREAST;  Surgeon: Wallace Going, DO;   Location: [place];  Service: Plastics;  Laterality: Left;   MASTECTOMY Left 2000s   for breast cancer, also s/p radiation and chemo   MITRAL VALVE REPLACEMENT     mechanical   POLYPECTOMY  06/07/2018   Procedure: POLYPECTOMY;  Surgeon: Wilford Corner, MD;  Location: [facility];  Service: Endoscopy;;   RIGHT/LEFT HEART CATH AND CORONARY ANGIOGRAPHY N/A 01/09/2021   Procedure: RIGHT/LEFT HEART CATH AND CORONARY ANGIOGRAPHY;  Surgeon: Jolaine Artist, MD;  Location: [facility];  Service: Cardiovascular;  Laterality: N/A;   WOUND DEBRIDEMENT Left 12/28/2018   Procedure: EXCISION OF LEFT CHRONIC CHEST WALL WOUND;  Surgeon: Coralie Keens, MD;  Location: [place];  Service: General;  Laterality: Left;   Past Surgical History:  Procedure Laterality Date   APPLICATION OF A-CELL OF CHEST/ABDOMEN Left 01/27/2019   Procedure: APPLICATION OF A-CELL OF CHEST;  Surgeon: Wallace Going, DO;  Location: [place];  Service: Plastics;  Laterality: Left;   APPLICATION OF WOUND VAC N/A 01/27/2019   Procedure: APPLICATION OF WOUND VAC;  Surgeon: Wallace Going, DO;  Location: [place];  Service: Plastics;  Laterality: N/A;   COLONOSCOPY WITH PROPOFOL N/A 06/07/2018   Procedure: COLONOSCOPY WITH PROPOFOL;  Surgeon: Wilford Corner, MD;  [facility];  Service: Endoscopy;  Laterality: N/A;   DEBRIDEMENT AND CLOSURE WOUND Left 12/28/2018   Procedure: Debridement And Closure Wound;  Surgeon: Coralie Keens, MD;  Location: [place];  Service: General;  Laterality: Left;   INCISION AND DRAINAGE OF WOUND Left 01/27/2019   Procedure: IRRIGATION AND DEBRIDEMENT OF CHEST WALL;  Surgeon: Wallace Going, DO;  Location: [place];  Service: Plastics;  Laterality: Left;   LATISSIMUS FLAP TO BREAST Left 01/18/2019   Procedure: LEFT LATISSIMUS FLAP TO BREAST;  Surgeon: Wallace Going, DO;  Location: [place];  Service: Plastics;  Laterality: Left;   MASTECTOMY Left 2000s   for breast  cancer, also s/p radiation and chemo   MITRAL VALVE REPLACEMENT     mechanical   POLYPECTOMY  06/07/2018   Procedure: POLYPECTOMY;  Surgeon: Wilford Corner, MD;  [facility];  Service: Endoscopy;;   RIGHT/LEFT HEART CATH AND CORONARY ANGIOGRAPHY N/A 01/09/2021   Procedure: RIGHT/LEFT HEART CATH AND CORONARY ANGIOGRAPHY;  Surgeon: Jolaine Artist, MD;  Location: [place] [facility];  Service: Cardiovascular;  Laterality: N/A;   WOUND DEBRIDEMENT Left 12/28/2018   Procedure: EXCISION OF LEFT CHRONIC CHEST WALL WOUND;  Surgeon: Coralie Keens, MD;  Location: [place];  Service: General;  Laterality: Left;   Past Medical History:  Diagnosis Date   Arthritis    Asthma    Breast cancer ([place]) 2001  Left Breast Cancer   CHF (congestive heart failure) (HCC)    COPD (chronic obstructive pulmonary disease) (HCC)    Coronary artery disease    History of mitral valve replacement with mechanical valve    HLD (hyperlipidemia)    Hypertension    Pulmonary embolism (HCC)    BP 113/74   Pulse 77   Temp 98.1 F (36.7 C)   Ht 5\' 5"  (1.651 m)   Wt 177 lb 12.8 oz (80.6 kg)   SpO2 95%   BMI 29.59 kg/m   Opioid Risk Score:   Fall Risk Score:  `1  Depression screen PHQ 2/9  Depression screen [facility] 2/9 01/27/2021 11/12/2020 08/26/2020 06/11/2020 05/03/2020 03/19/2020 02/28/2020  Decreased Interest 0 0 0 0 0 0 0  Down, Depressed, Hopeless 0 0 0 0 0 0 0  PHQ - 2 Score 0 0 0 0 0 0 0  Altered sleeping - - - - - - -  Tired, decreased energy - - - - - - -  Change in appetite - - - - - - -  Feeling bad or failure about yourself  - - - - - - -  Trouble concentrating - - - - - - -  Moving slowly or fidgety/restless - - - - - - -  Suicidal thoughts - - - - - - -  PHQ-9 Score - - - - - - -  Difficult doing work/chores - - - - - - -  Some recent data might be hidden    Review of Systems  Musculoskeletal:  Positive for back pain and gait problem.       Right foot pain  All other systems  reviewed and are negative.     Objective:   Physical Exam  Awake, alert, appropriate, limping- favoring R foot, NAD, using Single point cane  R foot/forefoot- somewhat warm- not hot Erythema tinged tissue around bunion-  moderate swelling around bunion/first MTP base- looks more like gout than cellulitis Constantly sniffing- sinus congestion TTP over R low back with radiation with palpation.     Assessment & Plan:   Patient is a 70 yr old female with aortic stenosis s/p AVR on Coumadin,  HTN, hx of PE's on chronic coumadin , CHF, and COPD- here for f/u of R side low back  Pain with sciatica. Due to get ECHO for CHF;  Tramadol makes her ill as well as Tylenol #3 and Darvocet.  Here for f/u on chronic pain.    1 Needs oral drug screen today- last done 09/30/20.    2. Would suggest going back to [facility]- or PCP  and having them assess R foot again- have them think about gout vs cellulitis vs both?   3. I don't feel comfortable treating with PO ABX since I'm not sure we are treating cellulitis or something else.    4. Will renew Norco 5/325 mg q4 hours prn #150- due to fill early.   5. Will try Singulair/Moneleukast 10 mg nightly- for sinus pain/pressure. Will give 1 month with 1 refill, but get from PCP from now on.    6. F/U in 4 months- since pretty stable.    I spent a total of of 22 minutes on visit- discussing R foot more than anything.

## 2021-03-24 NOTE — Patient Instructions (Signed)
Patient is a 70 yr old female with aortic stenosis s/p AVR on Coumadin,  HTN, hx of PE's on chronic coumadin , CHF, and COPD- here for f/u of R side low back  Pain with sciatica. Due to get ECHO for CHF;  Tramadol makes her ill as well as Tylenol #3 and Darvocet.  Here for f/u on chronic pain.    1 Needs oral drug screen today- last done 09/30/20.    2. Would suggest going back to [facility]- or PCP  and having them assess R foot again- have them think about gout vs cellulitis vs both?   3. I don't feel comfortable treating with PO ABX since I'm not sure we are treating cellulitis or something else.    4. Will renew Norco 5/325 mg q4 hours prn #150- due to fill early.   5. Will try Singulair/Moneleukast 10 mg nightly- for sinus pain/pressure. Will give 1 month with 1 refill, but get from PCP from now on.    6. F/U in 4 months- since pretty stable.

## 2021-03-24 NOTE — Progress Notes (Signed)
Anticoagulation Management Kim Anthony is a 70 y.o. female who reports to the clinic for monitoring of warfarin treatment.    Indication: PE , History of; long term current use of oral anticoagulation. Duration: indefinite Supervising physician: [facility] Visit History: Patient does not report signs/symptoms of bleeding or thromboembolism  Other recent changes: No diet, medications, lifestyle changes.  Anticoagulation Episode Summary     Current INR goal:  2.5-3.5  TTR:  28.4 % (5.1 y)  Next INR check:  03/27/2021  INR from last check:  1.7 (03/24/2021)  Weekly max warfarin dose:    Target end date:    INR check location:  Anticoagulation Clinic  Preferred lab:    Send INR reminders to:     Indications   Pulmonary embolism (HCC) [I26.99]        Comments:           Allergies  Allergen Reactions   Lisinopril Swelling   Hydrocodone-Acetaminophen Itching   Acetaminophen-Codeine Itching    Other reaction(s): Other (See Comments)   Propoxyphene Itching    Darvocet   Tape Rash    Plastic   Tramadol Itching, Nausea And Vomiting and Rash    Current Outpatient Medications:    albuterol (PROVENTIL) (2.5 MG/3ML) 0.083% nebulizer solution, USE 1 VIAL IN NEBULIZER EVERY 6 HOURS AS NEEDED FOR WHEEZING FOR SHORTNESS OF BREATH, Disp: 90 mL, Rfl: 2   albuterol (VENTOLIN HFA) 108 (90 Base) MCG/ACT inhaler, INHALE 2 PUFFS BY MOUTH EVERY 6 HOURS AS NEEDED FOR WHEEZING FOR SHORTNESS OF BREATH, Disp: 9 g, Rfl: 0   doxycycline (VIBRAMYCIN) 100 MG capsule, doxycycline hyclate 100 mg capsule  Take 1 capsule twice a day by oral route., Disp: , Rfl:    FARXIGA 10 MG TABS tablet, Take 1 tablet by mouth once daily, Disp: 30 tablet, Rfl: 0   fluticasone (FLONASE) 50 MCG/ACT nasal spray, Place 1 spray into both nostrils daily as needed for allergies., Disp: 9.9 mL, Rfl: 11   furosemide (LASIX) 40 MG tablet, Take 0.5 tablets (20 mg total) by mouth daily., Disp: 30 tablet,  Rfl: 2   gabapentin (NEURONTIN) 600 MG tablet, Take 1.5 tablets (900 mg total) by mouth 3 (three) times daily. For nerve pain- increased to get her off lyrica, Disp: 150 tablet, Rfl: 5   hydrALAZINE (APRESOLINE) 25 MG tablet, Take 1 tablet (25 mg total) by mouth 3 (three) times daily., Disp: 90 tablet, Rfl: 11   HYDROcodone-acetaminophen (NORCO) 5-325 MG tablet, Take 1 tablet by mouth every 4 (four) hours as needed for moderate pain. No more than 5 times  a day, Disp: 150 tablet, Rfl: 0   isosorbide mononitrate (IMDUR) 30 MG 24 hr tablet, Take 1 tablet by mouth once daily, Disp: 30 tablet, Rfl: 0   metoprolol succinate (TOPROL-XL) 25 MG 24 hr tablet, Take 1 tablet (25 mg total) by mouth daily., Disp: 30 tablet, Rfl: 0   nicotine (NICODERM CQ - DOSED IN MG/24 HR) 7 mg/24hr patch, Place 2 patches (14 mg total) onto the skin daily for 120 days, THEN 1 patch (7 mg total) daily., Disp: 300 patch, Rfl: 0   potassium chloride SA (KLOR-CON) 20 MEQ tablet, Take 1 tablet (20 mEq total) by mouth daily., Disp: 30 tablet, Rfl: 6   predniSONE (DELTASONE) 10 MG tablet, Take 1 tablet (10 mg total) by mouth daily with breakfast., Disp: 15 tablet, Rfl: 0   simvastatin (ZOCOR) 20 MG tablet, Take 1 tablet by mouth once daily, Disp:  90 tablet, Rfl: 0   Spacer/Aero-Holding Chambers (BREATHERITE COLL SPACER ADULT) MISC, 1 applicator by Does not apply route daily., Disp: 1 each, Rfl: 0   spironolactone (ALDACTONE) 25 MG tablet, Take 1 tablet (25 mg total) by mouth daily., Disp: 30 tablet, Rfl: 6   STIOLTO RESPIMAT 2.5-2.5 MCG/ACT AERS, INHALE 2 PUFFS BY MOUTH ONCE DAILY, Disp: 4 g, Rfl: 0   warfarin (COUMADIN) 5 MG tablet, Take 1 tablet (5 mg total) by mouth one time only at 4 PM., Disp: 30 tablet, Rfl: 2 Past Medical History:  Diagnosis Date   Arthritis    Asthma    Breast cancer ([place]) 2001   Left Breast Cancer   CHF (congestive heart failure) (HCC)    COPD (chronic obstructive pulmonary disease) (HCC)    Coronary  artery disease    History of mitral valve replacement with mechanical valve    HLD (hyperlipidemia)    Hypertension    Pulmonary embolism (HCC)    Social History   Socioeconomic History   Marital status: Single    Spouse name: Not on file   Number of children: Not on file   Years of education: Not on file   Highest education level: Not on file  Occupational History   Not on file  Tobacco Use   Smoking status: Every Day    Packs/day: 0.10    Years: 30.00    Pack years: 3.00    Types: Cigarettes   Smokeless tobacco: Never   Tobacco comments:    Sometimes less. 2 per day  Vaping Use   Vaping Use: Never used  Substance and Sexual Activity   Alcohol use: Yes    Comment: beer- ocasional   Drug use: No   Sexual activity: Not Currently    Birth control/protection: None  Other Topics Concern   Not on file  Social History Narrative   ** Merged History Encounter **       Social Determinants of Health   Financial Resource Strain: Medium Risk   Difficulty of Paying Living Expenses: Somewhat hard  Food Insecurity: Landscape architect Present   Worried About Charity fundraiser in the Last Year: Never true   Ran Out of Food in the Last Year: Sometimes true  Transportation Needs: No Transportation Needs   Lack of Transportation (Medical): No   Lack of Transportation (Non-Medical): No  Physical Activity: Not on file  Stress: Not on file  Social Connections: Not on file   Family History  Problem Relation Age of Onset   Hypertension Mother    Cancer Father    Hypertension Father    Breast cancer Maternal Aunt 50   Heart attack Other     ASSESSMENT Recent Results: The most recent result is correlated with 32.5 mg per week BUT WITH THE PAST TWO DAYS DOSING OMITTED: Lab Results  Component Value Date   INR 1.7 (A) 03/24/2021   INR 5.3 (HH) 02/03/2021   INR 2.6 01/27/2021    Anticoagulation Dosing: Description   Take one tablet by mouth, [facility]-daily at [facility] today and  tomorrow. On Wednesday, 5-OCT-22, take only 1/2 tablet. Perform patient self-testing, finger-stick INR on Thursday 6-OCT-22.      INR today: Subtherapeutic  PLAN Weekly dose was changed to one of her 5mg  strength warfarin tablets today, tomorrow, and then 1/2 of her 5mg  strength warfarin tablets on Wednesday. Perform patient self testing on Thursday March 27, 2021. Text me with results for new dosing instructions.   Patient  Instructions  Patient instructed to take medications as defined in the Anti-coagulation Track section of this encounter.  Patient instructed to take today's dose.  Patient instructed to take one of your 5mg  peach colored warfarin tablets today, Tuesday and Wednesday. On Thursday 6-OCT-22, perform patient self testing, finger-stick, point of care INR.  Patient verbalized understanding of these instructions.   Patient advised to contact clinic or seek medical attention if signs/symptoms of bleeding or thromboembolism occur.  Patient verbalized understanding by repeating back information and was advised to contact me if further medication-related questions arise. Patient was also provided an information handout.  Follow-up Return in 3 days (on 03/27/2021) for Follow up INR.  Pennie Banter, PharmD, CPP  15 minutes spent face-to-face with the patient during the encounter. 50% of time spent on education, including signs/sx bleeding and clotting, as well as food and drug interactions with warfarin. 50% of time was spent on fingerprick POC INR sample collection,processing, results determination, and documentation in [email or web address]/.

## 2021-03-25 ENCOUNTER — Encounter: Payer: Medicare Other | Admitting: Internal Medicine

## 2021-03-26 ENCOUNTER — Ambulatory Visit: Payer: Medicare Other

## 2021-03-26 NOTE — Progress Notes (Signed)
Evaluation and management procedures were performed by the Clinical Pharmacy Practitioner under my supervision and collaboration. I have reviewed the Practitioner's note and chart, and I agree with the management and plan as documented above. ° °

## 2021-03-26 NOTE — Patient Instructions (Signed)
Visit Information   Goals Addressed               This Visit's Progress     I'm taking all my medications like I'm supposed to." (pt-stated)        CARE PLAN ENTRY (see longitudinal plan of care for additional care plan information)  Current Barriers:  Chronic Disease Management support, education, and care coordination needs related to CHF, CAD, HTN, HLD, COPD, and Asthma - Patient notes she is not taking her Hydralazine 25mg  3x day as ordered, she is taking it 2x day as she is concerned her BP will drop too low.  Per patient her home BP's that she takes several times per week- 119/78, 123/82.  Clinical Goal(s) related to CHF, CAD, HTN, HLD, COPD, and asthma :  Over the next 30 - 60 days, patient will:  Work with the care management team to address educational, disease management, and care coordination needs  Begin or continue self health monitoring activities as directed today Measure and record blood pressure 1-2 times per week and as needed  when you are able to get a home BP monitor, monitor O2 saturations when work of breathing changes from baseline Call provider office for new or worsened signs and symptoms of CHF, HTN, HLD, COPD, and Asthma Call care management team with questions or concerns Verbalize basic understanding of patient centered plan of care established today  Interventions related to CHF, CAD, HTN, HLD, COPD, and Asthma :  Evaluation of current treatment plans and patient's adherence to plan as established by provider Assessed patient's education and care coordination needs Reinforced the importance of INR monitoring while on Coumadin, reviewed results of last INR done 9/8 with subtherapeutic levels and requested she have daughter check INR tonight and text results to Dr. Elie Confer since Coumadin dose may need adjustment Verified with patient that she continues to receive home based primary care via [facility] - per chart review most recent home visit was  04/30/20 Collaborated with appropriate clinical care team members regarding patient needs- will notify [name] that patient has been asked to have daughter check INR tonight and text him the results  Encouraged patient to call the clinic at the end of the week to check on status of referral to hand surgeon    Patient Self Care Activities related to CHF, HTN, HLD, COPD, and Asthma :  Patient is unable to independently self-manage chronic health conditions  Please see  updated [employer] care plan        The patient verbalized understanding of instructions, educational materials, and care plan provided today and declined offer to receive copy of patient instructions, educational materials, and care plan.   Telephone follow up appointment with care management team member scheduled for: 04/23/21@11am   [name], RN, BSN, CCM Care Management Coordinator [facility] Phone: [phone] / Fax: [phone]

## 2021-03-26 NOTE — Chronic Care Management (AMB) (Signed)
Care Management    RN Visit Note  03/26/2021 Name: Kim Anthony [name]: [id number] DOB: [birth year]  Subjective: Kim Anthony is a 70 y.o. year old female who is a primary care patient of Marianna Payment, MD. The care management team was consulted for assistance with disease management and care coordination needs.    Engaged with patient by telephone for follow up visit in response to provider referral for case management and/or care coordination services.   Consent to Services:   Kim Anthony was given information about Care Management services today including:  Care Management services includes personalized support from designated clinical staff supervised by her physician, including individualized plan of care and coordination with other care providers 24/7 contact phone numbers for assistance for urgent and routine care needs. The patient may stop case management services at any time by phone call to the office staff.  Patient agreed to services and consent obtained.   Assessment: Review of patient past medical history, allergies, medications, health status, including review of consultants reports, laboratory and other test data, was performed as part of comprehensive evaluation and provision of chronic care management services.   SDOH (Social Determinants of Health) assessments and interventions performed:    Care Plan  Allergies  Allergen Reactions   Lisinopril Swelling   Hydrocodone-Acetaminophen Itching   Acetaminophen-Codeine Itching    Other reaction(s): Other (See Comments)   Propoxyphene Itching    Darvocet   Tape Rash    Plastic   Tramadol Itching, Nausea And Vomiting and Rash    Outpatient Encounter Medications as of 03/26/2021  Medication Sig   albuterol (PROVENTIL) (2.5 MG/3ML) 0.083% nebulizer solution USE 1 VIAL IN NEBULIZER EVERY 6 HOURS AS NEEDED FOR WHEEZING FOR SHORTNESS OF BREATH   albuterol (VENTOLIN HFA) 108 (90 Base) MCG/ACT inhaler INHALE 2 PUFFS BY MOUTH EVERY  6 HOURS AS NEEDED FOR WHEEZING FOR SHORTNESS OF BREATH   FARXIGA 10 MG TABS tablet Take 1 tablet by mouth once daily   fluticasone (FLONASE) 50 MCG/ACT nasal spray Place 1 spray into both nostrils daily as needed for allergies.   furosemide (LASIX) 40 MG tablet Take 0.5 tablets (20 mg total) by mouth daily.   gabapentin (NEURONTIN) 600 MG tablet Take 1.5 tablets (900 mg total) by mouth 3 (three) times daily. For nerve pain- increased to get her off lyrica   hydrALAZINE (APRESOLINE) 25 MG tablet Take 1 tablet (25 mg total) by mouth 3 (three) times daily.   HYDROcodone-acetaminophen (NORCO) 5-325 MG tablet Take 1 tablet by mouth every 4 (four) hours as needed for moderate pain. No more than 5 times  a day   isosorbide mononitrate (IMDUR) 30 MG 24 hr tablet Take 1 tablet by mouth once daily   metoprolol succinate (TOPROL-XL) 25 MG 24 hr tablet Take 1 tablet (25 mg total) by mouth daily.   montelukast (SINGULAIR) 10 MG tablet Take 1 tablet (10 mg total) by mouth at bedtime.   potassium chloride SA (KLOR-CON) 20 MEQ tablet Take 1 tablet (20 mEq total) by mouth daily.   simvastatin (ZOCOR) 20 MG tablet Take 1 tablet by mouth once daily   Spacer/Aero-Holding Chambers (BREATHERITE COLL SPACER ADULT) MISC 1 applicator by Does not apply route daily.   spironolactone (ALDACTONE) 25 MG tablet Take 1 tablet (25 mg total) by mouth daily.   STIOLTO RESPIMAT 2.5-2.5 MCG/ACT AERS INHALE 2 PUFFS BY MOUTH ONCE DAILY   warfarin (COUMADIN) 5 MG tablet Take 1 tablet (5 mg total) by mouth one time  only at 4 PM.   No facility-administered encounter medications on file as of 03/26/2021.    Patient Active Problem List   Diagnosis Date Noted   Seronegative inflammatory arthritis 02/17/2021   High risk medication use 02/17/2021   Elevated transaminase level 01/27/2021   Respiratory distress    Dyspnea 01/05/2021   Acute idiopathic gout of multiple sites 09/30/2020   Eye redness 02/15/2020   Chronic pain syndrome  01/17/2020   Monoarthritis of wrist 10/18/2019   Ganglion cyst 09/21/2019   Hip pain 08/23/2019   Thoracic ascending aortic aneurysm 07/05/2019   Pustular rash 03/09/2019   Anemia 02/16/2019   Breast cancer ([place])    Acquired absence of left breast 12/20/2018   Open wound of axillary region 05/11/2018   COPD (chronic obstructive pulmonary disease) ([place]) 11/25/2017   S/P MVR (mitral valve replacement)    Chronic anticoagulation    Tobacco use disorder 02/25/2017   Lymphadenopathy, mediastinal 02/25/2017   History of mitral valve replacement with mechanical valve 11/18/2016   Essential hypertension 02/01/2016   Allergic rhinitis 02/01/2016   Healthcare maintenance 01/30/2016   Chronic low back pain 07/22/2015   Pulmonary embolism ([place]) 07/11/2015   Chronic combined systolic and diastolic heart failure ([place]) 07/11/2015   Headache, common migraine 07/11/2015   HLD (hyperlipidemia) 07/11/2015    Conditions to be addressed/monitored: CHF, HTN, and COPD  Care Plan : Chronic Pain (Adult)  Updates made by [name], RN since 03/26/2021 12:00 AM     Problem: Chronic Pain Management (Chronic Pain)      Goal: Chronic Pain Managed- acute on chronic right wrist pain is managed   Start Date: 06/20/2020  Recent Progress: On track  Priority: High  Note:   CARE PLAN ENTRY (see longitudinal plan of care for additional care plan information)  Current Barriers:  Knowledge Deficits related to managing acute/chronic pain- Successful outreach to patient via phone. Patient notes that she went to see the rheumatologist and she was diagnosed with Seronegative Inflammatory Arthritis.  She was started on a  course of Prednisone and she completed the course of treatment, which helped her wrist pain. Chronic Disease Management support and education needs related to chronic pain  Nurse Case Manager Clinical Goal(s):  Over the next 30-60 days, patient will verbalize understanding of plan for  managing pain Over the next 30-60  days, patient will attend all scheduled medical appointments:   Interventions:  Evaluation of current treatment plan related to right wrist pain and patient's adherence to plan as established by provider. Referred to community care guide for assistance in securing personal emergency response resources Discussed plans with patient for ongoing care management follow up and provided patient with direct contact information for care management team  Patient Self Care Activities:  Patient verbalizes understanding of plan to work with providers in treating right wrist pain Self administers medications as prescribed Attends all scheduled provider appointments Calls pharmacy for medication refills Calls provider office for new concerns or questions  keep all provider appointments  - use distraction techniques - use relaxation during pain   -wear right wrist brace - contact provider for unrelieved severe pain Follow up Plan: The care management team will reach out to the patient again over the next 30-60 days.     Care Plan : COPD (Adult)  Updates made by [name], RN since 03/26/2021 12:00 AM     Problem: Symptom Exacerbation (COPD)- call provider when symptoms not improving despite   Priority: High  Long-Range Goal: Symptom Exacerbation Prevented or Minimized to prevent hospitalization or need for emergency treatment   Start Date: 06/20/2020  Recent Progress: On track  Priority: High  Note:   Current Barriers:  Knowledge deficits related to basic COPD self care/management- Patient noted her breathing is doing well today, she is enjoying the cooler weather.     Case Manager Clinical Goal(s): Over the next 30-60 days, patient will not be hospitalized for COPD exacerbation  Interventions:  Advised patient to self assesses COPD action plan zone and make appointment with provider if in the yellow zone for 48 hours without  improvement. Provided education about and advised patient to utilize infection prevention strategies to reduce risk of respiratory infection  09/16/20- Advised patient that provider sent the Rx for portable nebulizer sent to [facility] on [facility] or per Skeet Latch, [employer] representative, portable nebulizers are now in stock at [place] store in [facility] and patient could purchase one there 11/12/20- advised patient to call this CCM RN with the phone number of the portable nebulizer company when she sees the TV ad 12/10/20- Spoke with Bradley at [facility] at [phone]; she states they provide the portable machine and the nebulizer medications, says patient should have no out of pocket cost for the portable nebulizer if she has had her current nebulizer for > 5 years 12/10/20- attempted to reach patient via mobile number to ask about age of home nebulizer, unable to leave message as voice mailbox not set up  12/10/20- Community message sent to [employer] Representatives asking if patient's current nebulizer can be adapted to use in car and if not, the age of her current nebulizer 12/17/20- Verified that patient received portable nebulizer and that she has used it in the car successfully  Patient Goals/Self-Care Activities:  eliminate symptom triggers at home - follow rescue plan if symptoms flare-up - keep follow-up appointments - use an extra pillow to sleep if needed Follow Up Plan: The care management team will reach out to the patient again over the next 30-60 days.      Care Plan : CCM RN Hypertension (Adult)  Updates made by [name], RN since 03/26/2021 12:00 AM     Problem: Hypertension (Hypertension)      Long-Range Goal: Hypertension Monitored   Recent Progress: On track  Priority: High  Note:   Objective: Successful outreach to patient for follow up.   Reviewed patients medications and how she is taking her meds.  Patient shared that she is only  taking 2 Hydralazine 67m per day, not 3 times a day as prescribed. Patient feels her blood pressure will drop too much if she takes this medication 3x day. Encouraged patient to follow her doctors order for her medications, however, I let her know I would send this not to her MD so he/she is aware of her concerns. Per patient she has not been able to reach anyone when she calls 1-800-quit-now. We discussed looking online as there are resources for nicotine patches through federal programs such as the 1-800-quit line; hShowReturn.ca Patient does not have access to the Internet at this time, but when her adult daughter gets home she will ask her to look. Last practice recorded BP readings:  BP Readings from Last 3 Encounters:  03/24/21 113/74  03/11/21 98/69  02/17/21 122/68   Most recent eGFR/CrCl:  Lab Results  Component Value Date   EGFR 57 (L) 01/27/2021    No components found  for: CRCL Current Barriers:  Knowledge Deficits related to basic understanding of hypertension pathophysiology and self care management Unable to independently self educate on HTN self management to meet treatment targets Case Manager Clinical Goal(s):  patient will verbalize understanding of plan for hypertension management patient will attend all scheduled medical appointments: including all follow up clinic appointments patient will demonstrate improved adherence to prescribed treatment plan for hypertension as evidenced by taking all medications as prescribed, monitoring and recording blood pressure as directed, adhering to low sodium/DASH diet patient will demonstrate improved health management independence as evidenced by checking blood pressure as directed and notifying PCP if SBP>140 or DBP > 90,  taking all medications as prescribe, and adhering to a low sodium diet as discussed. Interventions:  Collaboration with Marianna Payment, MD regarding development and update of  comprehensive plan of care as evidenced by provider attestation and co-signature Inter-disciplinary care team collaboration (see longitudinal plan of care)- Message sent to care team to update on her medication concerns Evaluation of current treatment plan related to hypertension self management and patient's adherence to plan as established by provider. Provided education to patient re: stroke prevention, s/s of heart attack and stroke, DASH diet, complications of uncontrolled blood pressure Reviewed medications with patient and discussed importance of compliance Encouraged use of home blood pressure monitoring daily and recording on blood pressure log sheet in [facility] spiral bound notebook/day planner; include symptoms of hypotension or potential medication side effects in log.  Reviewed blood pressure treatment targets  Reviewed scheduled/upcoming provider appointments including:  Self-Care Activities: - Self administers medications as prescribed Attends all scheduled provider appointments Calls provider office for new concerns, questions, or BP outside discussed parameters Checks BP and records as discussed Follows a low sodium diet/DASH diet Patient Goals: - check blood pressure weekly - ask questions to understand - learn about high blood pressure        Plan: Telephone follow up appointment with care management team member scheduled for:  30 days  [name], RN, BSN, CCM Care Management Coordinator [facility] Phone: [phone] / Fax: [phone]

## 2021-03-28 ENCOUNTER — Ambulatory Visit: Payer: Medicare Other | Admitting: Physical Medicine and Rehabilitation

## 2021-03-31 LAB — DRUG TOX MONITOR 1 W/CONF, ORAL FLD
Amphetamines: NEGATIVE ng/mL (ref <10)
Barbiturates: NEGATIVE ng/mL (ref <10)
Benzodiazepines: NEGATIVE ng/mL (ref <0.50)
Buprenorphine: NEGATIVE ng/mL (ref <0.10)
Cocaine: NEGATIVE ng/mL (ref <5.0)
Codeine: NEGATIVE ng/mL (ref <2.5)
Cotinine: 143.7 ng/mL — ABNORMAL HIGH (ref <5.0)
Dihydrocodeine: 25.7 ng/mL — ABNORMAL HIGH (ref <2.5)
Fentanyl: NEGATIVE ng/mL (ref <0.10)
Heroin Metabolite: NEGATIVE ng/mL (ref <1.0)
Hydrocodone: >250 ng/mL — ABNORMAL HIGH (ref <2.5)
Hydromorphone: NEGATIVE ng/mL (ref <2.5)
MARIJUANA: NEGATIVE ng/mL (ref <2.5)
MDMA: NEGATIVE ng/mL (ref <10)
Meprobamate: NEGATIVE ng/mL (ref <2.5)
Methadone: NEGATIVE ng/mL (ref <5.0)
Morphine: NEGATIVE ng/mL (ref <2.5)
Nicotine Metabolite: POSITIVE ng/mL — AB (ref <5.0)
Norhydrocodone: 20 ng/mL — ABNORMAL HIGH (ref <2.5)
Noroxycodone: NEGATIVE ng/mL (ref <2.5)
Opiates: POSITIVE ng/mL — AB (ref <2.5)
Oxycodone: NEGATIVE ng/mL (ref <2.5)
Oxymorphone: NEGATIVE ng/mL (ref <2.5)
Phencyclidine: NEGATIVE ng/mL (ref <10)
Tapentadol: NEGATIVE ng/mL (ref <5.0)
Zolpidem: NEGATIVE ng/mL (ref <5.0)

## 2021-03-31 LAB — DRUG TOX ALC METAB W/CON, ORAL FLD: Alcohol Metabolite: NEGATIVE ng/mL (ref <25)

## 2021-04-04 ENCOUNTER — Telehealth: Payer: Self-pay | Admitting: Pharmacist

## 2021-04-04 ENCOUNTER — Telehealth: Payer: Self-pay

## 2021-04-04 NOTE — Telephone Encounter (Signed)
Received following faxed results from [facility] with test date of 04/04/21:  INR 2.9   Will send to Dr. Elie Confer and place hard copy on his desk. Thank you, SChaplin, RN,BSN

## 2021-04-04 NOTE — Telephone Encounter (Signed)
Patient's daughter texted me results of FS POC patient self testing INR = 2.9 (target 2.5 - 3.5). Have instructed daughter to see that her mom takes 5mg  warfarin today Friday 14-OCT-22 and on Saturday 15-OCT-22; on SUNDAY take only 1/2 tablet (2.5mg ). PST FS POC INR on Monday 16-OCT-22.

## 2021-04-07 ENCOUNTER — Other Ambulatory Visit: Payer: Self-pay | Admitting: Physical Medicine and Rehabilitation

## 2021-04-07 ENCOUNTER — Telehealth: Payer: Self-pay | Admitting: *Deleted

## 2021-04-07 DIAGNOSIS — G8929 Other chronic pain: Secondary | ICD-10-CM

## 2021-04-07 DIAGNOSIS — M5441 Lumbago with sciatica, right side: Secondary | ICD-10-CM

## 2021-04-07 NOTE — Telephone Encounter (Signed)
Oral swab drug screen was consistent for prescribed medications.  ?

## 2021-04-08 ENCOUNTER — Encounter: Payer: Self-pay | Admitting: Pharmacist

## 2021-04-08 ENCOUNTER — Telehealth: Payer: Self-pay

## 2021-04-08 ENCOUNTER — Telehealth: Payer: Self-pay | Admitting: Pharmacist

## 2021-04-08 NOTE — Telephone Encounter (Signed)
Patient is calling requesting a refill on Hydrocodone. Last fill was 03/12/21.

## 2021-04-09 ENCOUNTER — Other Ambulatory Visit: Payer: Self-pay | Admitting: Internal Medicine

## 2021-04-09 ENCOUNTER — Telehealth (HOSPITAL_COMMUNITY): Payer: Self-pay | Admitting: Licensed Clinical Social Worker

## 2021-04-09 DIAGNOSIS — I5042 Chronic combined systolic (congestive) and diastolic (congestive) heart failure: Secondary | ICD-10-CM

## 2021-04-09 DIAGNOSIS — J449 Chronic obstructive pulmonary disease, unspecified: Secondary | ICD-10-CM

## 2021-04-09 MED ORDER — HYDROCODONE-ACETAMINOPHEN 5-325 MG PO TABS: 1.0000 | ORAL_TABLET | ORAL | 0 refills | Status: DC | PRN

## 2021-04-09 NOTE — Telephone Encounter (Signed)
Patient notified

## 2021-04-09 NOTE — Progress Notes (Signed)
Heart and Vascular Care Navigation  04/09/2021  Kim Anthony 1951-03-27 [id number]  Reason for Referral: financial assistance   Engaged with patient by telephone for follow up visit for Heart and Vascular Care Coordination.                                                                                                   Assessment:   Pt had been calling for assistance with electric bill.  States that she stopped getting disability so now she only gets her retirement check amount of $430/month which makes it hard to get what she needs.  Stays with her dtr who pays for everything else but struggling to afford their electric bill this month of $149.71.  Is working with a Chief Executive Officer to get back her disability check which she states was taken away due to being over asset limit.                                 HRT/VAS Care Coordination     Living arrangements for the past 2 months Single Family Home   Lives with: Adult Children   Patient Current Insurance Coverage Managed Medicare; Medicaid   Patient Has Concern With Paying Medical Bills No   Does Patient Have Prescription Coverage? Yes   Home Assistive Devices/Equipment Cane (specify quad or straight)   DME [facility]   Current home services DME  cane, nebulizer machine, portable nebulizer machine       Social History:                                                                             SDOH Screenings   Alcohol Screen: Not on file  Depression (PHQ2-9): Low Risk    PHQ-2 Score: 0  Financial Resource Strain: Medium Risk   Difficulty of Paying Living Expenses: Somewhat hard  Food Insecurity: Landscape architect Present   Worried About Charity fundraiser in the Last Year: Never true   Ran Out of Food in the Last Year: Sometimes true  Housing: Medium Risk   Last Housing Risk Score: 1  Physical Activity: Not on file  Social Connections: Not on file  Stress: Not on file  Tobacco Use: High Risk    Smoking Tobacco Use: Every Day   Smokeless Tobacco Use: Never  Transportation Needs: No Transportation Needs   Lack of Transportation (Medical): No   Lack of Transportation (Non-Medical): No    SDOH Interventions: Financial Resources:  Financial Strain Interventions: Other (Comment) (patient care fund for assistance with utility bill)   Food Insecurity:   None reported  Housing Insecurity:  Housing Interventions: Other (Comment) (patient care fund for [employer])  Transportation:  None reported   Follow-up plan:    Pt to send CSW [employer] bill so we can request check to help with this months bill.  Will continue to follow and assist as needed  Jorge Ny, [facility] Desk#: [phone] Cell#: [phone]

## 2021-04-09 NOTE — Telephone Encounter (Signed)
Advised patient of dose of warfarin to take for 3 consecutive days,repeat INR on Thursday 20-OCT-22.

## 2021-04-09 NOTE — Telephone Encounter (Signed)
Was texted INR results (patient self-testing) of 2.3 (goal 2.5 - 3.5). Was advised to take 5mg  on Monday 17-OCT-22; 5mg  on Tuesday 18-OCT-22; and 5mg  on Wednesday 19-OCT-22. Repeat PST FS POC INR on Thursday 20-OCT-22.

## 2021-04-10 ENCOUNTER — Telehealth (HOSPITAL_COMMUNITY): Payer: Self-pay | Admitting: Licensed Clinical Social Worker

## 2021-04-10 NOTE — Telephone Encounter (Signed)
Pt sent [employer] bill to [facility]- check request submitted so we can assist with this bill through the Patient Care Fund.  Jorge Ny, LCSW Clinical Social Worker [facility] Desk#: [phone] Cell#: [phone]

## 2021-04-11 ENCOUNTER — Ambulatory Visit: Payer: Medicare Other | Admitting: Physical Medicine and Rehabilitation

## 2021-04-11 DIAGNOSIS — M170 Bilateral primary osteoarthritis of knee: Secondary | ICD-10-CM | POA: Diagnosis not present

## 2021-04-11 DIAGNOSIS — M1712 Unilateral primary osteoarthritis, left knee: Secondary | ICD-10-CM | POA: Diagnosis not present

## 2021-04-14 ENCOUNTER — Other Ambulatory Visit: Payer: Self-pay | Admitting: Internal Medicine

## 2021-04-16 ENCOUNTER — Telehealth: Payer: Self-pay

## 2021-04-16 ENCOUNTER — Telehealth (HOSPITAL_COMMUNITY): Payer: Self-pay | Admitting: Licensed Clinical Social Worker

## 2021-04-16 DIAGNOSIS — I5042 Chronic combined systolic (congestive) and diastolic (congestive) heart failure: Secondary | ICD-10-CM

## 2021-04-16 NOTE — Telephone Encounter (Signed)
Check request processed to assist pt with [employer] bill through patient care fund- put in mail.  Pt informed of above.  Will continue to follow and assist as needed  Jorge Ny, [facility] Desk#: [phone] Cell#: [phone]

## 2021-04-23 ENCOUNTER — Encounter: Payer: Self-pay | Admitting: Internal Medicine

## 2021-04-23 ENCOUNTER — Ambulatory Visit (INDEPENDENT_AMBULATORY_CARE_PROVIDER_SITE_OTHER): Payer: Medicare Other | Admitting: Internal Medicine

## 2021-04-23 ENCOUNTER — Other Ambulatory Visit: Payer: Self-pay

## 2021-04-23 ENCOUNTER — Telehealth: Payer: Medicare Other

## 2021-04-23 VITALS — BP 137/82 | HR 70 | Temp 98.3°F | Ht 65.0 in | Wt 178.6 lb

## 2021-04-23 DIAGNOSIS — K219 Gastro-esophageal reflux disease without esophagitis: Secondary | ICD-10-CM | POA: Diagnosis not present

## 2021-04-23 DIAGNOSIS — F17200 Nicotine dependence, unspecified, uncomplicated: Secondary | ICD-10-CM

## 2021-04-23 DIAGNOSIS — I5042 Chronic combined systolic (congestive) and diastolic (congestive) heart failure: Secondary | ICD-10-CM | POA: Diagnosis not present

## 2021-04-23 DIAGNOSIS — Z952 Presence of prosthetic heart valve: Secondary | ICD-10-CM

## 2021-04-23 DIAGNOSIS — E785 Hyperlipidemia, unspecified: Secondary | ICD-10-CM | POA: Diagnosis not present

## 2021-04-23 DIAGNOSIS — T7840XA Allergy, unspecified, initial encounter: Secondary | ICD-10-CM

## 2021-04-23 DIAGNOSIS — J301 Allergic rhinitis due to pollen: Secondary | ICD-10-CM

## 2021-04-23 DIAGNOSIS — J449 Chronic obstructive pulmonary disease, unspecified: Secondary | ICD-10-CM

## 2021-04-23 DIAGNOSIS — M25462 Effusion, left knee: Secondary | ICD-10-CM

## 2021-04-23 DIAGNOSIS — Z23 Encounter for immunization: Secondary | ICD-10-CM

## 2021-04-23 DIAGNOSIS — Z0000 Encounter for general adult medical examination without abnormal findings: Secondary | ICD-10-CM | POA: Diagnosis not present

## 2021-04-23 DIAGNOSIS — I10 Essential (primary) hypertension: Secondary | ICD-10-CM

## 2021-04-23 LAB — POCT INR: INR: 1.8 — AB (ref 2.0–3.0)

## 2021-04-23 MED ORDER — FLUTICASONE PROPIONATE 50 MCG/ACT NA SUSP: 1.0000 | Freq: Every day | NASAL | 2 refills | Status: DC | PRN

## 2021-04-23 MED ORDER — MONTELUKAST SODIUM 10 MG PO TABS: 10.0000 mg | ORAL_TABLET | Freq: Every day | ORAL | 2 refills | Status: DC

## 2021-04-23 MED ORDER — ALBUTEROL SULFATE HFA 108 (90 BASE) MCG/ACT IN AERS: 2.0000 | INHALATION_SPRAY | Freq: Four times a day (QID) | RESPIRATORY_TRACT | 2 refills | Status: DC | PRN

## 2021-04-23 MED ORDER — SIMVASTATIN 20 MG PO TABS: 20.0000 mg | ORAL_TABLET | Freq: Every day | ORAL | 2 refills | Status: DC

## 2021-04-23 MED ORDER — STIOLTO RESPIMAT 2.5-2.5 MCG/ACT IN AERS: INHALATION_SPRAY | RESPIRATORY_TRACT | 2 refills | Status: DC

## 2021-04-23 MED ORDER — METOPROLOL SUCCINATE ER 25 MG PO TB24: 25.0000 mg | ORAL_TABLET | Freq: Every day | ORAL | 2 refills | Status: DC

## 2021-04-23 MED ORDER — VARENICLINE TARTRATE 0.5 MG PO TABS: ORAL_TABLET | ORAL | 0 refills | Status: AC

## 2021-04-23 MED ORDER — FAMOTIDINE 20 MG PO TABS: 20.0000 mg | ORAL_TABLET | Freq: Two times a day (BID) | ORAL | 1 refills | Status: DC

## 2021-04-23 MED ORDER — DAPAGLIFLOZIN PROPANEDIOL 10 MG PO TABS: 10.0000 mg | ORAL_TABLET | Freq: Every day | ORAL | 2 refills | Status: DC

## 2021-04-23 MED ORDER — NICOTINE 7 MG/24HR TD PT24: 7.0000 mg | MEDICATED_PATCH | TRANSDERMAL | 0 refills | Status: DC

## 2021-04-23 MED ORDER — ISOSORBIDE MONONITRATE ER 30 MG PO TB24: 30.0000 mg | ORAL_TABLET | Freq: Every day | ORAL | 2 refills | Status: DC

## 2021-04-23 NOTE — Assessment & Plan Note (Signed)
>>  ASSESSMENT AND PLAN FOR CHRONIC HFREF (HEART FAILURE WITH REDUCED EJECTION FRACTION) (HCC) WRITTEN ON 09/14/2023 11:43 AM BY Krystyne Tewksbury, DO   >>ASSESSMENT AND PLAN FOR CHF (CONGESTIVE HEART FAILURE) (HCC) WRITTEN ON 09/14/2023 11:43 AM BY Camilia Caywood, DO   >>ASSESSMENT AND PLAN FOR BIVENTRICULAR HEART FAILURE WITH REDUCED LEFT VENTRICULAR FUNCTION (HCC) WRITTEN ON 04/24/2021  6:40 AM BY MASTERS, KATIE, DO  Patient presents for follow-up of heart failure with reduced ejection fraction.  Last echo completed in July 2022 during acute exacerbation, EF found to be 35-40%.  Dry weight is 176 pounds and today she weighs 178 pounds. She states that she followed up with the heart failure clinic, Dr. Gala Romney, and has no difficulties with adherence to her medications. Was seen by heart failure clinic in September 2022 and her Lasix dosing was decreased at that office visit.  Medications: Lasix 20 mg daily, Spironolactone 25 mg daily, Toprol-XL 25 mg daily, hydralazine 25 mg 3 times daily Imdur 30 mg daily, Farxiga 10 mg daily.   Plan: -Refilled Farxiga, Imdur, Toprol

## 2021-04-23 NOTE — Progress Notes (Signed)
Subjective:  CC: follow-up on heart failure, COPD, and tobacco use disorder  HPI:  [name] is a 70 y.o. female with a past medical history stated below and presents today for follow-up on heart failure, COPD, and tobacco use disorder. Please see problem based assessment and plan for additional details.  Past Medical History:  Diagnosis Date   Arthritis    Asthma    Breast cancer ([place]) 2001   Left Breast Cancer   CHF (congestive heart failure) (HCC)    COPD (chronic obstructive pulmonary disease) (HCC)    Coronary artery disease    History of mitral valve replacement with mechanical valve    HLD (hyperlipidemia)    Hypertension    Pulmonary embolism (HCC)     Current Outpatient Medications on File Prior to Visit  Medication Sig Dispense Refill   albuterol (PROVENTIL) (2.5 MG/3ML) 0.083% nebulizer solution USE 1 VIAL IN NEBULIZER EVERY 6 HOURS AS NEEDED FOR WHEEZING FOR SHORTNESS OF BREATH 90 mL 2   furosemide (LASIX) 40 MG tablet Take 0.5 tablets (20 mg total) by mouth daily. 30 tablet 2   gabapentin (NEURONTIN) 600 MG tablet TAKE 1 & 1/2 (ONE & ONE-HALF) TABLETS BY MOUTH THREE TIMES DAILY FOR  NERVE  PAIN 150 tablet 5   hydrALAZINE (APRESOLINE) 25 MG tablet Take 1 tablet (25 mg total) by mouth 3 (three) times daily. 90 tablet 11   HYDROcodone-acetaminophen (NORCO) 5-325 MG tablet Take 1 tablet by mouth every 4 (four) hours as needed for moderate pain. No more than 5 times  a day 150 tablet 0   potassium chloride SA (KLOR-CON) 20 MEQ tablet Take 1 tablet (20 mEq total) by mouth daily. 30 tablet 6   Spacer/Aero-Holding Chambers (BREATHERITE COLL SPACER ADULT) MISC 1 applicator by Does not apply route daily. 1 each 0   spironolactone (ALDACTONE) 25 MG tablet Take 1 tablet (25 mg total) by mouth daily. 30 tablet 6   warfarin (COUMADIN) 5 MG tablet Take 1 tablet (5 mg total) by mouth one time only at 4 PM. 30 tablet 2   No current facility-administered medications on file  prior to visit.    Family History  Problem Relation Age of Onset   Hypertension Mother    Cancer Father    Hypertension Father    Breast cancer Maternal Aunt 53   Heart attack Other     Social History   Socioeconomic History   Marital status: Single    Spouse name: Not on file   Number of children: Not on file   Years of education: Not on file   Highest education level: Not on file  Occupational History   Not on file  Tobacco Use   Smoking status: Every Day    Packs/day: 0.10    Years: 30.00    Pack years: 3.00    Types: Cigarettes   Smokeless tobacco: Never   Tobacco comments:    5 cigs per day  Vaping Use   Vaping Use: Never used  Substance and Sexual Activity   Alcohol use: Yes    Comment: beer- ocasional   Drug use: No   Sexual activity: Not Currently    Birth control/protection: None  Other Topics Concern   Not on file  Social History Narrative   ** Merged History Encounter **       Social Determinants of Health   Financial Resource Strain: Medium Risk   Difficulty of Paying Living Expenses: Somewhat hard  Food Insecurity:  Food Insecurity Present   Worried About Charity fundraiser in the Last Year: Never true   Ran Out of Food in the Last Year: Sometimes true  Transportation Needs: No Transportation Needs   Lack of Transportation (Medical): No   Lack of Transportation (Non-Medical): No  Physical Activity: Not on file  Stress: Not on file  Social Connections: Not on file  Intimate Partner Violence: Not on file    Review of Systems: ROS negative except for what is noted on the assessment and plan.  Objective:   Vitals:   04/23/21 0935  BP: 137/82  Pulse: 70  Temp: 98.3 F (36.8 C)  TempSrc: Oral  SpO2: 98%  Weight: 178 lb 9.6 oz (81 kg)  Height: 5\' 5"  (1.651 m)    Physical Exam: Gen: A&O x3 and in no apparent distress, well appearing and nourished. HEENT:    Head - normocephalic, atraumatic.    Eye - visual acuity grossly intact,  conjunctiva clear, sclera non-icteric, EOM intact.    Mouth - No obvious caries or periodontal disease. Neck: no masses or nodules, AROM intact. CV: RRR, no murmurs, S1/S2 presents  Resp: Clear to ascultation bilaterally  Abd: BS (+) x4, soft, non-tender abdomen, without hepatosplenomegaly or masses MSK: Effusion present on left knee, change motion mildly limited in that joint Skin: good skin turgor, no rashes, unusual bruising, or prominent lesions.  Neuro: No focal deficits, grossly normal sensation and coordination.  Psych: Oriented x3 and responding appropriately. Intact memory, normal mood, judgement, affect, and insight.    Assessment & Plan:  See Encounters Tab for problem based charting.  Patient seen with Dr. Jay Schlichter Aldeen Riga, D.O. [facility]  PGY-1 Pager: [phone]  Phone: [phone] Date 04/24/2021  Time 6:57 AM

## 2021-04-23 NOTE — Assessment & Plan Note (Signed)
>>  ASSESSMENT AND PLAN FOR CHF (CONGESTIVE HEART FAILURE) (HCC) WRITTEN ON 09/14/2023 11:43 AM BY Efrain Clauson, DO   >>ASSESSMENT AND PLAN FOR BIVENTRICULAR HEART FAILURE WITH REDUCED LEFT VENTRICULAR FUNCTION (HCC) WRITTEN ON 04/24/2021  6:40 AM BY MASTERS, KATIE, DO  Patient presents for follow-up of heart failure with reduced ejection fraction.  Last echo completed in July 2022 during acute exacerbation, EF found to be 35-40%.  Dry weight is 176 pounds and today she weighs 178 pounds. She states that she followed up with the heart failure clinic, Dr. Gala Romney, and has no difficulties with adherence to her medications. Was seen by heart failure clinic in September 2022 and her Lasix dosing was decreased at that office visit.  Medications: Lasix 20 mg daily, Spironolactone 25 mg daily, Toprol-XL 25 mg daily, hydralazine 25 mg 3 times daily Imdur 30 mg daily, Farxiga 10 mg daily.   Plan: -Refilled Farxiga, Imdur, Toprol

## 2021-04-23 NOTE — Assessment & Plan Note (Addendum)
Patient presents for follow-up of heart failure with reduced ejection fraction.  Last echo completed in July 2022 during acute exacerbation, EF found to be 35-40%.  Dry weight is 176 pounds and today she weighs 178 pounds. She states that she followed up with the heart failure clinic, Dr. Haroldine Laws, and has no difficulties with adherence to her medications. Was seen by heart failure clinic in September 2022 and her Lasix dosing was decreased at that office visit.  Medications: Lasix 20 mg daily, Spironolactone 25 mg daily, Toprol-XL 25 mg daily, hydralazine 25 mg 3 times daily Imdur 30 mg daily, Farxiga 10 mg daily.   Plan: -Refilled Farxiga, Imdur, Toprol

## 2021-04-23 NOTE — Assessment & Plan Note (Signed)
>>  ASSESSMENT AND PLAN FOR HFREF (HEART FAILURE WITH REDUCED EJECTION FRACTION) (HCC) WRITTEN ON 09/14/2023 11:44 AM BY Kenston Longton, DO   >>ASSESSMENT AND PLAN FOR CHRONIC HFREF (HEART FAILURE WITH REDUCED EJECTION FRACTION) (HCC) WRITTEN ON 09/14/2023 11:43 AM BY Ketsia Linebaugh, DO   >>ASSESSMENT AND PLAN FOR CHF (CONGESTIVE HEART FAILURE) (HCC) WRITTEN ON 09/14/2023 11:43 AM BY Ebrima Ranta, DO   >>ASSESSMENT AND PLAN FOR BIVENTRICULAR HEART FAILURE WITH REDUCED LEFT VENTRICULAR FUNCTION (HCC) WRITTEN ON 04/24/2021  6:40 AM BY MASTERS, KATIE, DO  Patient presents for follow-up of heart failure with reduced ejection fraction.  Last echo completed in July 2022 during acute exacerbation, EF found to be 35-40%.  Dry weight is 176 pounds and today she weighs 178 pounds. She states that she followed up with the heart failure clinic, Dr. Gala Romney, and has no difficulties with adherence to her medications. Was seen by heart failure clinic in September 2022 and her Lasix dosing was decreased at that office visit.  Medications: Lasix 20 mg daily, Spironolactone 25 mg daily, Toprol-XL 25 mg daily, hydralazine 25 mg 3 times daily Imdur 30 mg daily, Farxiga 10 mg daily.   Plan: -Refilled Farxiga, Imdur, Toprol

## 2021-04-23 NOTE — Patient Instructions (Signed)
[name], it was a pleasure seeing you today!  Today we discussed:  Heart failure-  I refilled Farxiga 10 mg, Imdur 30 mg, and metoprolol 25 mg. It sounds like you are doing a great jon of cutting out salt and taking your medications.  Great job!  INR- your INT was low at 1.8.  For the next week please take 5 mg of warfarin daily.  Please self test at home on Monday and send results to Dr. Elie Confer.  If unable to do that for some reason, please follow-up in clinic next week.  Smoking cessation- You are interested in stopping smoking.  I have sent in Chantix, a pill medication, to help with this.  You will take 0.5 mg once for the next three days, then 0.5 mg twice a day for three more days, and then 1 mg twice daily for the next 12 weeks.  This medication will help decrease craving.  Please follow-up via telephone in one week to discuss how you are doing with stopping smoking.  I have ordered the following labs today:   Lab Orders         POCT INR      Tests ordered today:  INR  Referrals ordered today:   Referral Orders  No referral(s) requested today     I have ordered the following medication/changed the following medications:   Stop the following medications: Medications Discontinued During This Encounter  Medication Reason   metoprolol succinate (TOPROL-XL) 25 MG 24 hr tablet Reorder   fluticasone (FLONASE) 50 MCG/ACT nasal spray Reorder   simvastatin (ZOCOR) 20 MG tablet    montelukast (SINGULAIR) 10 MG tablet Reorder   STIOLTO RESPIMAT 2.5-2.5 MCG/ACT AERS Reorder   albuterol (VENTOLIN HFA) 108 (90 Base) MCG/ACT inhaler Reorder   isosorbide mononitrate (IMDUR) 30 MG 24 hr tablet Reorder   FARXIGA 10 MG TABS tablet Reorder     Start the following medications: Meds ordered this encounter  Medications   albuterol (VENTOLIN HFA) 108 (90 Base) MCG/ACT inhaler    Sig: Inhale 2 puffs into the lungs every 6 (six) hours as needed for wheezing or shortness of breath.     Dispense:  9 g    Refill:  2   isosorbide mononitrate (IMDUR) 30 MG 24 hr tablet    Sig: Take 1 tablet (30 mg total) by mouth daily.    Dispense:  90 tablet    Refill:  2   simvastatin (ZOCOR) 20 MG tablet    Sig: Take 1 tablet (20 mg total) by mouth daily.    Dispense:  90 tablet    Refill:  2   dapagliflozin propanediol (FARXIGA) 10 MG TABS tablet    Sig: Take 1 tablet (10 mg total) by mouth daily.    Dispense:  90 tablet    Refill:  2   Tiotropium Bromide-Olodaterol (STIOLTO RESPIMAT) 2.5-2.5 MCG/ACT AERS    Sig: Inhale 2 puffs by mouth once daily    Dispense:  4 g    Refill:  2   fluticasone (FLONASE) 50 MCG/ACT nasal spray    Sig: Place 1 spray into both nostrils daily as needed for allergies.    Dispense:  9.9 mL    Refill:  2   montelukast (SINGULAIR) 10 MG tablet    Sig: Take 1 tablet (10 mg total) by mouth at bedtime.    Dispense:  90 tablet    Refill:  2   metoprolol succinate (TOPROL-XL) 25 MG 24 hr tablet  Sig: Take 1 tablet (25 mg total) by mouth daily.    Dispense:  90 tablet    Refill:  2   nicotine (NICODERM CQ - DOSED IN MG/24 HR) 7 mg/24hr patch    Sig: Place 1 patch (7 mg total) onto the skin daily.    Dispense:  30 patch    Refill:  0   varenicline (CHANTIX) 0.5 MG tablet    Sig: Take 1 tablet (0.5 mg total) by mouth daily for 3 days, THEN 1 tablet (0.5 mg total) 2 (two) times daily for 3 days, THEN 2 tablets (1 mg total) 2 (two) times daily.    Dispense:  345 tablet    Refill:  0   famotidine (PEPCID) 20 MG tablet    Sig: Take 1 tablet (20 mg total) by mouth 2 (two) times daily.    Dispense:  60 tablet    Refill:  1     Follow-up:  1 week    Please make sure to arrive 15 minutes prior to your next appointment. If you arrive late, you may be asked to reschedule.   We look forward to seeing you next time. Please call our clinic at [phone] if you have any questions or concerns. The best time to call is Monday-Friday from 9am-4pm, but there is  someone available 24/7. If after hours or the weekend, call the main hospital number and ask for the Internal Medicine Resident On-Call. If you need medication refills, please notify your pharmacy one week in advance and they will send [place] a request.  Thank you for letting us take part in your care. Wishing you the best!  Thank you, Dr. [facility]

## 2021-04-23 NOTE — Assessment & Plan Note (Signed)
>>  ASSESSMENT AND PLAN FOR BIVENTRICULAR HEART FAILURE WITH REDUCED LEFT VENTRICULAR FUNCTION (HCC) WRITTEN ON 04/24/2021  6:40 AM BY MASTERS, KATIE, DO  Patient presents for follow-up of heart failure with reduced ejection fraction.  Last echo completed in July 2022 during acute exacerbation, EF found to be 35-40%.  Dry weight is 176 pounds and today she weighs 178 pounds. She states that she followed up with the heart failure clinic, Dr. Gala Romney, and has no difficulties with adherence to her medications. Was seen by heart failure clinic in September 2022 and her Lasix dosing was decreased at that office visit.  Medications: Lasix 20 mg daily, Spironolactone 25 mg daily, Toprol-XL 25 mg daily, hydralazine 25 mg 3 times daily Imdur 30 mg daily, Farxiga 10 mg daily.   Plan: -Refilled Farxiga, Imdur, Toprol

## 2021-04-24 ENCOUNTER — Ambulatory Visit: Payer: Medicare Other

## 2021-04-24 DIAGNOSIS — M1712 Unilateral primary osteoarthritis, left knee: Secondary | ICD-10-CM

## 2021-04-24 DIAGNOSIS — M25462 Effusion, left knee: Secondary | ICD-10-CM | POA: Insufficient documentation

## 2021-04-24 HISTORY — DX: Unilateral primary osteoarthritis, left knee: M17.12

## 2021-04-24 NOTE — Assessment & Plan Note (Signed)
Patient currently smokes 5 cigarettes a day.  She would like to stop smoking.  She has previously tried Chantix in the past, but would like to try it again.  She also states that while she was in the hospital in July nicotine patches reduced her cravings.  Medicaid does not cover nicotine patches, discussed with patient that she could call1 800 quit NOW for additional resources or purchase nicotine patches over-the-counter if possible.   Plan: - Chantix, will follow-up via telehealth to establish quit date -Nicotine patches

## 2021-04-24 NOTE — Assessment & Plan Note (Signed)
Patient on chronic anticoagulation states that she tripped and fell last week on cement.  Since then her left knee has had an effusion that has limited her mobility due to some pain.  She is able to ambulate with nonantalgic gait and no assistive device.  Scheduled follow-up with Ortho on November 7.

## 2021-04-24 NOTE — Assessment & Plan Note (Signed)
Patient received flu vaccination today 

## 2021-04-24 NOTE — Assessment & Plan Note (Signed)
Lab Results  Component Value Date   CHOL 156 01/10/2021   HDL 54 01/10/2021   LDLCALC 76 01/10/2021   TRIG 132 01/10/2021   CHOLHDL 2.9 01/10/2021   Last lipid panel completed in July of this year.  At that time her LDL was at goal for primary prevention less than 100.  She reports no difficulties taking simvastatin 20 mg and asked for refill at this time.  Plan: - Refilled simvastatin 20 mg -Been consistently at goal over the last couple of years, consider lipid panel repeat in July 2023.

## 2021-04-24 NOTE — Assessment & Plan Note (Signed)
Patient states that she is feeling well at this time.  She continues to smoke 5 cigarettes daily, but is interested in therapies to help her stop smoking. On physical exam lungs are completely clear.  Medications: Stiolto, Albuterol  Plan: -Refill stiolto and albuterol.

## 2021-04-24 NOTE — Assessment & Plan Note (Signed)
BP Readings from Last 3 Encounters:  04/23/21 137/82  03/24/21 113/74  03/11/21 98/69   Blood pressure at 137/82 today.  Patient denies any concerns with taking medications.  Medication: Toprol 25 mg daily, hydralazine 25 mg 3 times daily, Spironolactone 12.5 mg daily, Lasix 20 mg daily  Plan:  -Refill Toprol 25 mg

## 2021-04-24 NOTE — Chronic Care Management (AMB) (Signed)
Care Management    RN Visit Note  04/24/2021 Name: Kim Anthony [name]: [id number] DOB: 04-Oct-1950  Subjective: Kim Anthony is a 69 y.o. year old female who is a primary care patient of Kim Payment, MD. The care management team was consulted for assistance with disease management and care coordination needs.    Engaged with patient by telephone for follow up visit in response to provider referral for case management and/or care coordination services.   Consent to Services:   Kim Anthony was given information about Care Management services today including:  Care Management services includes personalized support from designated clinical staff supervised by her physician, including individualized plan of care and coordination with other care providers 24/7 contact phone numbers for assistance for urgent and routine care needs. The patient may stop case management services at any time by phone call to the office staff.  Patient agreed to services and consent obtained.   Assessment: Review of patient past medical history, allergies, medications, health status, including review of consultants reports, laboratory and other test data, was performed as part of comprehensive evaluation and provision of chronic care management services.   SDOH (Social Determinants of Health) assessments and interventions performed:    Care Plan  Allergies  Allergen Reactions   Lisinopril Swelling   Hydrocodone-Acetaminophen Itching   Acetaminophen-Codeine Itching    Other reaction(s): Other (See Comments)   Propoxyphene Itching    Darvocet   Tape Rash    Plastic   Tramadol Itching, Nausea And Vomiting and Rash    Outpatient Encounter Medications as of 04/24/2021  Medication Sig   albuterol (PROVENTIL) (2.5 MG/3ML) 0.083% nebulizer solution USE 1 VIAL IN NEBULIZER EVERY 6 HOURS AS NEEDED FOR WHEEZING FOR SHORTNESS OF BREATH   albuterol (VENTOLIN HFA) 108 (90 Base) MCG/ACT inhaler Inhale 2 puffs into the lungs  every 6 (six) hours as needed for wheezing or shortness of breath.   dapagliflozin propanediol (FARXIGA) 10 MG TABS tablet Take 1 tablet (10 mg total) by mouth daily.   famotidine (PEPCID) 20 MG tablet Take 1 tablet (20 mg total) by mouth 2 (two) times daily.   fluticasone (FLONASE) 50 MCG/ACT nasal spray Place 1 spray into both nostrils daily as needed for allergies.   furosemide (LASIX) 40 MG tablet Take 0.5 tablets (20 mg total) by mouth daily.   gabapentin (NEURONTIN) 600 MG tablet TAKE 1 & 1/2 (ONE & ONE-HALF) TABLETS BY MOUTH THREE TIMES DAILY FOR  NERVE  PAIN   hydrALAZINE (APRESOLINE) 25 MG tablet Take 1 tablet (25 mg total) by mouth 3 (three) times daily.   HYDROcodone-acetaminophen (NORCO) 5-325 MG tablet Take 1 tablet by mouth every 4 (four) hours as needed for moderate pain. No more than 5 times  a day   isosorbide mononitrate (IMDUR) 30 MG 24 hr tablet Take 1 tablet (30 mg total) by mouth daily.   metoprolol succinate (TOPROL-XL) 25 MG 24 hr tablet Take 1 tablet (25 mg total) by mouth daily.   montelukast (SINGULAIR) 10 MG tablet Take 1 tablet (10 mg total) by mouth at bedtime.   nicotine (NICODERM CQ - DOSED IN MG/24 HR) 7 mg/24hr patch Place 1 patch (7 mg total) onto the skin daily.   potassium chloride SA (KLOR-CON) 20 MEQ tablet Take 1 tablet (20 mEq total) by mouth daily.   simvastatin (ZOCOR) 20 MG tablet Take 1 tablet (20 mg total) by mouth daily.   Spacer/Aero-Holding Chambers (BREATHERITE COLL SPACER ADULT) MISC 1 applicator by Does not apply  route daily.   spironolactone (ALDACTONE) 25 MG tablet Take 1 tablet (25 mg total) by mouth daily.   Tiotropium Bromide-Olodaterol (STIOLTO RESPIMAT) 2.5-2.5 MCG/ACT AERS Inhale 2 puffs by mouth once daily   [START ON 04/26/2021] varenicline (CHANTIX) 0.5 MG tablet Take 1 tablet (0.5 mg total) by mouth daily for 3 days, THEN 1 tablet (0.5 mg total) 2 (two) times daily for 3 days, THEN 2 tablets (1 mg total) 2 (two) times daily.   warfarin  (COUMADIN) 5 MG tablet Take 1 tablet (5 mg total) by mouth one time only at 4 PM.   No facility-administered encounter medications on file as of 04/24/2021.    Patient Active Problem List   Diagnosis Date Noted   Knee effusion, left 04/24/2021   Seronegative inflammatory arthritis 02/17/2021   High risk medication use 02/17/2021   Elevated transaminase level 01/27/2021   Acute idiopathic gout of multiple sites 09/30/2020   Chronic pain syndrome 01/17/2020   Monoarthritis of wrist 10/18/2019   Ganglion cyst 09/21/2019   Hip pain 08/23/2019   Thoracic ascending aortic aneurysm 07/05/2019   Anemia 02/16/2019   Breast cancer ([place])    Acquired absence of left breast 12/20/2018   COPD (chronic obstructive pulmonary disease) ([place]) 11/25/2017   S/P MVR (mitral valve replacement)    Chronic anticoagulation    Tobacco use disorder 02/25/2017   Lymphadenopathy, mediastinal 02/25/2017   Essential hypertension 02/01/2016   Allergic rhinitis 02/01/2016   Healthcare maintenance 01/30/2016   Chronic low back pain 07/22/2015   Pulmonary embolism ([place]) 07/11/2015   Chronic combined systolic and diastolic heart failure ([place]) 07/11/2015   Headache, common migraine 07/11/2015   HLD (hyperlipidemia) 07/11/2015    Conditions to be addressed/monitored: HTN and COPD  Care Plan : CCM RN Hypertension (Adult)  Updates made by Kim Killian, RN since 04/24/2021 12:00 AM     Problem: Hypertension (Hypertension)      Long-Range Goal: Hypertension Monitored   Recent Progress: On track  Priority: High  Note:   Objective: Successful outreach to patient for follow up.   Patient stated she is doing well but she couldn't talk and would call this RNCM back later. Last practice recorded BP readings:  BP Readings from Last 3 Encounters:  04/23/21 137/82  03/24/21 113/74  03/11/21 98/69   Most recent eGFR/CrCl:  Lab Results  Component Value Date   EGFR 57 (L) 01/27/2021    No components found for:  CRCL Current Barriers:  Knowledge Deficits related to basic understanding of hypertension pathophysiology and self care management Unable to independently self educate on HTN self management to meet treatment targets Case Manager Clinical Goal(s):  patient will verbalize understanding of plan for hypertension management patient will attend all scheduled medical appointments: including all follow up clinic appointments patient will demonstrate improved adherence to prescribed treatment plan for hypertension as evidenced by taking all medications as prescribed, monitoring and recording blood pressure as directed, adhering to low sodium/DASH diet patient will demonstrate improved health management independence as evidenced by checking blood pressure as directed and notifying PCP if SBP>140 or DBP > 90,  taking all medications as prescribe, and adhering to a low sodium diet as discussed. Interventions:  Collaboration with Kim Payment, MD regarding development and update of comprehensive plan of care as evidenced by provider attestation and co-signature Inter-disciplinary care team collaboration (see longitudinal plan of care) Evaluation of current treatment plan related to hypertension self management and patient's adherence to plan as established by provider. Provided education  to patient re: stroke prevention, s/s of heart attack and stroke, DASH diet, complications of uncontrolled blood pressure Reviewed medications with patient and discussed importance of compliance Encouraged use of home blood pressure monitoring daily and recording on blood pressure log sheet in [place] [facility] spiral bound notebook/day planner; include symptoms of hypotension or potential medication side effects in log.  Reviewed blood pressure treatment targets  Reviewed scheduled/upcoming provider appointments including:  Self-Care Activities: - Self administers medications as prescribed Attends  all scheduled provider appointments Calls provider office for new concerns, questions, or BP outside discussed parameters Checks BP and records as discussed Follows a low sodium diet/DASH diet Patient Goals: - check blood pressure weekly - ask questions to understand - learn about high blood pressure  Follow Up Plan: The care management team will reach out to the patient again over the next 30 days.                Plan: The care management team will reach out to the patient again over the next 7 days if she does not return the call.  Kim Killian, RN, BSN, CCM Care Management Coordinator [facility] Phone: [phone]: [phone]

## 2021-04-24 NOTE — Patient Instructions (Signed)
Visit Information  The patient verbalized understanding of instructions, educational materials, and care plan provided today and declined offer to receive copy of patient instructions, educational materials, and care plan.   The care management team will reach out to the patient again over the next 7 days.   [name], RN, BSN, CCM Care Management Coordinator [facility] Phone: [phone]: [phone]

## 2021-04-24 NOTE — Assessment & Plan Note (Signed)
Patient with a history of mechanical mitral valve on warfarin. INR subtherapeutic at 1.8 today with goal of 2.5-3.5.  Patient has been taking 5 mg 6 days a week and 2.5 mg once a week for a 32.5 mg weekly total.  Ask her to take 5 mg 7 days a week and ti recheck her INR on Monday and send the results to internal medicine clinic pharmacist, Dr. Elie Confer.  Plan: - Increased warfarin dosage by 8% - We will have patient recheck INR on Monday and send result to Dr. Johney Maine

## 2021-04-24 NOTE — Progress Notes (Signed)
Internal Medicine Clinic Attending  I saw and evaluated the patient.  I personally confirmed the key portions of the history and exam documented by Dr. Masters and I reviewed pertinent patient test results.  The assessment, diagnosis, and plan were formulated together and I agree with the documentation in the resident's note.  

## 2021-04-24 NOTE — Assessment & Plan Note (Signed)
Patient states that she has seasonal allergies that are worsened whenever the weather is getting colder.   Plan: - Refilled Flonase and Singulair

## 2021-04-28 DIAGNOSIS — M170 Bilateral primary osteoarthritis of knee: Secondary | ICD-10-CM | POA: Diagnosis not present

## 2021-04-29 ENCOUNTER — Ambulatory Visit: Payer: Medicare Other

## 2021-04-29 NOTE — Chronic Care Management (AMB) (Signed)
Care Management    RN Visit Note  04/29/2021 Name: Kim Anthony [name]: [id number] DOB: Mar 21, 1951  Subjective: Kim Anthony is a 70 y.o. year old female who is a primary care patient of Kim Payment, MD. The care management team was consulted for assistance with disease management and care coordination needs.    Engaged with patient by telephone for follow up visit in response to provider referral for case management and/or care coordination services.   Consent to Services:   Kim Anthony was given information about Care Management services today including:  Care Management services includes personalized support from designated clinical staff supervised by her physician, including individualized plan of care and coordination with other care providers 24/7 contact phone numbers for assistance for urgent and routine care needs. The patient may stop case management services at any time by phone call to the office staff.  Patient agreed to services and consent obtained.   Assessment: Review of patient past medical history, allergies, medications, health status, including review of consultants reports, laboratory and other test data, was performed as part of comprehensive evaluation and provision of chronic care management services.   SDOH (Social Determinants of Health) assessments and interventions performed:    Care Plan  Allergies  Allergen Reactions   Lisinopril Swelling   Hydrocodone-Acetaminophen Itching   Acetaminophen-Codeine Itching    Other reaction(s): Other (See Comments)   Propoxyphene Itching    Darvocet   Tape Rash    Plastic   Tramadol Itching, Nausea And Vomiting and Rash    Outpatient Encounter Medications as of 04/29/2021  Medication Sig   albuterol (PROVENTIL) (2.5 MG/3ML) 0.083% nebulizer solution USE 1 VIAL IN NEBULIZER EVERY 6 HOURS AS NEEDED FOR WHEEZING FOR SHORTNESS OF BREATH   albuterol (VENTOLIN HFA) 108 (90 Base) MCG/ACT inhaler Inhale 2 puffs into the lungs  every 6 (six) hours as needed for wheezing or shortness of breath.   dapagliflozin propanediol (FARXIGA) 10 MG TABS tablet Take 1 tablet (10 mg total) by mouth daily.   famotidine (PEPCID) 20 MG tablet Take 1 tablet (20 mg total) by mouth 2 (two) times daily.   fluticasone (FLONASE) 50 MCG/ACT nasal spray Place 1 spray into both nostrils daily as needed for allergies.   furosemide (LASIX) 40 MG tablet Take 0.5 tablets (20 mg total) by mouth daily.   gabapentin (NEURONTIN) 600 MG tablet TAKE 1 & 1/2 (ONE & ONE-HALF) TABLETS BY MOUTH THREE TIMES DAILY FOR  NERVE  PAIN   hydrALAZINE (APRESOLINE) 25 MG tablet Take 1 tablet (25 mg total) by mouth 3 (three) times daily.   HYDROcodone-acetaminophen (NORCO) 5-325 MG tablet Take 1 tablet by mouth every 4 (four) hours as needed for moderate pain. No more than 5 times  a day   isosorbide mononitrate (IMDUR) 30 MG 24 hr tablet Take 1 tablet (30 mg total) by mouth daily.   metoprolol succinate (TOPROL-XL) 25 MG 24 hr tablet Take 1 tablet (25 mg total) by mouth daily.   montelukast (SINGULAIR) 10 MG tablet Take 1 tablet (10 mg total) by mouth at bedtime.   nicotine (NICODERM CQ - DOSED IN MG/24 HR) 7 mg/24hr patch Place 1 patch (7 mg total) onto the skin daily.   potassium chloride SA (KLOR-CON) 20 MEQ tablet Take 1 tablet (20 mEq total) by mouth daily.   simvastatin (ZOCOR) 20 MG tablet Take 1 tablet (20 mg total) by mouth daily.   Spacer/Aero-Holding Chambers (BREATHERITE COLL SPACER ADULT) MISC 1 applicator by Does not apply  route daily.   spironolactone (ALDACTONE) 25 MG tablet Take 1 tablet (25 mg total) by mouth daily.   Tiotropium Bromide-Olodaterol (STIOLTO RESPIMAT) 2.5-2.5 MCG/ACT AERS Inhale 2 puffs by mouth once daily   varenicline (CHANTIX) 0.5 MG tablet Take 1 tablet (0.5 mg total) by mouth daily for 3 days, THEN 1 tablet (0.5 mg total) 2 (two) times daily for 3 days, THEN 2 tablets (1 mg total) 2 (two) times daily.   warfarin (COUMADIN) 5 MG  tablet Take 1 tablet (5 mg total) by mouth one time only at 4 PM.   No facility-administered encounter medications on file as of 04/29/2021.    Patient Active Problem List   Diagnosis Date Noted   Knee effusion, left 04/24/2021   Seronegative inflammatory arthritis 02/17/2021   High risk medication use 02/17/2021   Elevated transaminase level 01/27/2021   Acute idiopathic gout of multiple sites 09/30/2020   Chronic pain syndrome 01/17/2020   Monoarthritis of wrist 10/18/2019   Ganglion cyst 09/21/2019   Hip pain 08/23/2019   Thoracic ascending aortic aneurysm 07/05/2019   Anemia 02/16/2019   Breast cancer ([place])    Acquired absence of left breast 12/20/2018   COPD (chronic obstructive pulmonary disease) ([place]) 11/25/2017   S/P MVR (mitral valve replacement)    Chronic anticoagulation    Tobacco use disorder 02/25/2017   Lymphadenopathy, mediastinal 02/25/2017   Essential hypertension 02/01/2016   Allergic rhinitis 02/01/2016   Healthcare maintenance 01/30/2016   Chronic low back pain 07/22/2015   Pulmonary embolism ([place]) 07/11/2015   Chronic combined systolic and diastolic heart failure ([place]) 07/11/2015   Headache, common migraine 07/11/2015   HLD (hyperlipidemia) 07/11/2015    Conditions to be addressed/monitored: CHF, HTN, and COPD  Care Plan : Chronic Pain (Adult)  Updates made by Kim Killian, RN since 04/29/2021 12:00 AM     Problem: Chronic Pain Management (Chronic Pain)      Goal: Chronic Pain Managed- acute on chronic right wrist pain is managed   Start Date: 06/20/2020  Recent Progress: On track  Priority: High  Note:   CARE PLAN ENTRY (see longitudinal plan of care for additional care plan information)  Current Barriers:  Knowledge Deficits related to managing acute/chronic pain- Successful outreach to patient this morning. Patient noted to have had a fall when she tripped on the sidewalk and hurt her left knee.  She had a follow up with [facility]  yesterday and Kim Anthony took fluid off  her knee and took xray's which were both negative.   Chronic Disease Management support and education needs related to chronic pain  Nurse Case Manager Clinical Goal(s):  Over the next 30-60 days, patient will verbalize understanding of plan for managing pain Over the next 30-60  days, patient will attend all scheduled medical appointments:   Interventions:  Evaluation of current treatment plan related to right wrist pain and patient's adherence to plan as established by provider.-Right wrist pain is Anthony after a course of Prednisone. Referred to community care guide for assistance in securing personal emergency response resources Discussed plans with patient for ongoing care management follow up and provided patient with direct contact information for care management team  Patient Self Care Activities:  Patient verbalizes understanding of plan to work with providers in treating right wrist pain Self administers medications as prescribed Attends all scheduled provider appointments Calls pharmacy for medication refills Calls provider office for new concerns or questions  keep all provider appointments  - use distraction techniques -  use relaxation during pain   -wear right wrist brace - contact provider for unrelieved severe pain.     Care Plan : COPD (Adult)  Updates made by Kim Killian, RN since 04/29/2021 12:00 AM     Problem: Symptom Exacerbation (COPD)- call provider when symptoms not improving despite   Priority: High     Long-Range Goal: Symptom Exacerbation Prevented or Minimized to prevent hospitalization or need for emergency treatment   Start Date: 06/20/2020  Recent Progress: On track  Priority: High  Note:   Current Barriers:  Knowledge deficits related to basic COPD self care/management- Patient noted her breathing is doing well today, no issues with SOB today.  Patient had office visit at the clinic and was prescribed  Chantix 0.5mg  which she has taken in the past and wanted to try again.  Patient was smoking 5 cigarettes per day on average. Encourage patient to continue to work on smoking cessation.   Case Manager Clinical Goal(s): Over the next 30-60 days, patient will not be hospitalized for COPD exacerbation  Interventions:  Advised patient to self assesses COPD action plan zone and make appointment with provider if in the yellow zone for 48 hours without improvement. 12/10/20- Community message sent to [employer] asking if patient's current nebulizer can be adapted to use in car and if not, the age of her current nebulizer 12/17/20- Verified that patient received portable nebulizer and that she has used it in the car successfully  Patient Goals/Self-Care Activities:  eliminate symptom triggers at home - follow rescue plan if symptoms flare-up - keep follow-up appointments - use an extra pillow to sleep if needed        Plan: Telephone follow up appointment with care management team member scheduled for:  30 days  Kim Killian, RN, BSN, [facility] Phone: [phone]: [phone]

## 2021-04-29 NOTE — Patient Instructions (Signed)
Visit Information  The patient verbalized understanding of instructions, educational materials, and care plan provided today and declined offer to receive copy of patient instructions, educational materials, and care plan.   Telephone follow up appointment with care management team member scheduled for:05/27/21@10 :30.  [name], RN, BSN, CCM Care Management Coordinator [facility] Phone: [phone]: [phone]

## 2021-04-30 ENCOUNTER — Telehealth: Payer: Self-pay | Admitting: Pharmacist

## 2021-04-30 NOTE — Telephone Encounter (Signed)
Daughter calls with results of patient self-testing, finger-stick point of care INR results today = 2.2 after increase to 35mg  warfarin/wk when seen by clinic physician last week. Will increase to 1&1/2x5mg  (7.5mg ) on Wed/Sat; 5mg  all other days.INR 14-NOV-22. No symptoms of embolic disease or bleeding. Target INR 2.5 - 3.5

## 2021-05-09 DIAGNOSIS — M25562 Pain in left knee: Secondary | ICD-10-CM | POA: Diagnosis not present

## 2021-05-09 DIAGNOSIS — L03115 Cellulitis of right lower limb: Secondary | ICD-10-CM | POA: Diagnosis not present

## 2021-05-09 DIAGNOSIS — M170 Bilateral primary osteoarthritis of knee: Secondary | ICD-10-CM | POA: Diagnosis not present

## 2021-05-10 ENCOUNTER — Other Ambulatory Visit: Payer: Self-pay | Admitting: Internal Medicine

## 2021-05-10 DIAGNOSIS — I5042 Chronic combined systolic (congestive) and diastolic (congestive) heart failure: Secondary | ICD-10-CM

## 2021-05-12 DIAGNOSIS — M170 Bilateral primary osteoarthritis of knee: Secondary | ICD-10-CM | POA: Diagnosis not present

## 2021-05-12 DIAGNOSIS — L03115 Cellulitis of right lower limb: Secondary | ICD-10-CM | POA: Diagnosis not present

## 2021-05-12 DIAGNOSIS — M25561 Pain in right knee: Secondary | ICD-10-CM | POA: Diagnosis not present

## 2021-05-20 ENCOUNTER — Other Ambulatory Visit (HOSPITAL_COMMUNITY): Payer: Self-pay | Admitting: Family Medicine

## 2021-05-20 DIAGNOSIS — M25562 Pain in left knee: Secondary | ICD-10-CM | POA: Diagnosis not present

## 2021-05-20 DIAGNOSIS — M25561 Pain in right knee: Secondary | ICD-10-CM | POA: Diagnosis not present

## 2021-05-20 DIAGNOSIS — I5042 Chronic combined systolic (congestive) and diastolic (congestive) heart failure: Secondary | ICD-10-CM

## 2021-05-21 ENCOUNTER — Other Ambulatory Visit: Payer: Self-pay | Admitting: Internal Medicine

## 2021-05-21 DIAGNOSIS — I5042 Chronic combined systolic (congestive) and diastolic (congestive) heart failure: Secondary | ICD-10-CM

## 2021-05-27 ENCOUNTER — Telehealth (HOSPITAL_COMMUNITY): Payer: Self-pay | Admitting: *Deleted

## 2021-05-27 ENCOUNTER — Ambulatory Visit: Payer: Medicare Other

## 2021-05-27 DIAGNOSIS — I5042 Chronic combined systolic (congestive) and diastolic (congestive) heart failure: Secondary | ICD-10-CM

## 2021-05-27 DIAGNOSIS — J449 Chronic obstructive pulmonary disease, unspecified: Secondary | ICD-10-CM

## 2021-05-27 DIAGNOSIS — I10 Essential (primary) hypertension: Secondary | ICD-10-CM

## 2021-05-27 NOTE — Telephone Encounter (Signed)
Received fax from [facility], pt needs to have a knee arthroscopy with Dr Tonita Cong, they need clearance for surgery and recommendations for coumadin  Per Dr Haroldine Laws: "Ok to proceed. Will need Lovenox bridge for MVR"  INR managed by int. med clinic, message sent to them to arrange  Form faxed back to [facility] with note of who to contact regarding lovenox at [phone] attention Judeen Hammans

## 2021-05-27 NOTE — Chronic Care Management (AMB) (Signed)
Care Management    RN Visit Note  05/27/2021 Name: Kim Anthony [name]: [id number] DOB: 1950/09/24  Subjective: Kim Anthony is a 70 y.o. year old female who is a primary care patient of Marianna Payment, MD. The care management team was consulted for assistance with disease management and care coordination needs.    Engaged with patient by telephone for follow up visit in response to provider referral for case management and/or care coordination services.   Consent to Services:   [name] was given information about Care Management services today including:  Care Management services includes personalized support from designated clinical staff supervised by her physician, including individualized plan of care and coordination with other care providers 24/7 contact phone numbers for assistance for urgent and routine care needs. The patient may stop case management services at any time by phone call to the office staff.  Patient agreed to services and consent obtained.   Assessment: Review of patient past medical history, allergies, medications, health status, including review of consultants reports, laboratory and other test data, was performed as part of comprehensive evaluation and provision of chronic care management services.   SDOH (Social Determinants of Health) assessments and interventions performed:    Care Plan  Allergies  Allergen Reactions   Lisinopril Swelling   Hydrocodone-Acetaminophen Itching   Acetaminophen-Codeine Itching    Other reaction(s): Other (See Comments)   Propoxyphene Itching    Darvocet   Tape Rash    Plastic   Tramadol Itching, Nausea And Vomiting and Rash    Outpatient Encounter Medications as of 05/27/2021  Medication Sig   albuterol (PROVENTIL) (2.5 MG/3ML) 0.083% nebulizer solution USE 1 VIAL IN NEBULIZER EVERY 6 HOURS AS NEEDED FOR WHEEZING FOR SHORTNESS OF BREATH   albuterol (VENTOLIN HFA) 108 (90 Base) MCG/ACT inhaler Inhale 2 puffs into the lungs  every 6 (six) hours as needed for wheezing or shortness of breath.   dapagliflozin propanediol (FARXIGA) 10 MG TABS tablet Take 1 tablet (10 mg total) by mouth daily.   famotidine (PEPCID) 20 MG tablet Take 1 tablet (20 mg total) by mouth 2 (two) times daily.   fluticasone (FLONASE) 50 MCG/ACT nasal spray Place 1 spray into both nostrils daily as needed for allergies.   furosemide (LASIX) 40 MG tablet Take 1/2 (one-half) tablet by mouth once daily   gabapentin (NEURONTIN) 600 MG tablet TAKE 1 & 1/2 (ONE & ONE-HALF) TABLETS BY MOUTH THREE TIMES DAILY FOR  NERVE  PAIN   hydrALAZINE (APRESOLINE) 25 MG tablet Take 1 tablet (25 mg total) by mouth 3 (three) times daily.   HYDROcodone-acetaminophen (NORCO) 5-325 MG tablet Take 1 tablet by mouth every 4 (four) hours as needed for moderate pain. No more than 5 times  a day   isosorbide mononitrate (IMDUR) 30 MG 24 hr tablet Take 1 tablet (30 mg total) by mouth daily.   metoprolol succinate (TOPROL-XL) 25 MG 24 hr tablet Take 1 tablet (25 mg total) by mouth daily.   montelukast (SINGULAIR) 10 MG tablet Take 1 tablet (10 mg total) by mouth at bedtime.   nicotine (NICODERM CQ - DOSED IN MG/24 HR) 7 mg/24hr patch Place 1 patch (7 mg total) onto the skin daily.   potassium chloride SA (KLOR-CON) 20 MEQ tablet Take 1 tablet (20 mEq total) by mouth daily.   simvastatin (ZOCOR) 20 MG tablet Take 1 tablet (20 mg total) by mouth daily.   Spacer/Aero-Holding Chambers (BREATHERITE COLL SPACER ADULT) MISC 1 applicator by Does not apply route  daily.   spironolactone (ALDACTONE) 25 MG tablet Take 1 tablet (25 mg total) by mouth daily.   Tiotropium Bromide-Olodaterol (STIOLTO RESPIMAT) 2.5-2.5 MCG/ACT AERS Inhale 2 puffs by mouth once daily   varenicline (CHANTIX) 0.5 MG tablet Take 1 tablet (0.5 mg total) by mouth daily for 3 days, THEN 1 tablet (0.5 mg total) 2 (two) times daily for 3 days, THEN 2 tablets (1 mg total) 2 (two) times daily.   warfarin (COUMADIN) 5 MG  tablet Take 1 tablet (5 mg total) by mouth one time only at 4 PM.   No facility-administered encounter medications on file as of 05/27/2021.    Patient Active Problem List   Diagnosis Date Noted   Knee effusion, left 04/24/2021   Seronegative inflammatory arthritis 02/17/2021   High risk medication use 02/17/2021   Elevated transaminase level 01/27/2021   Acute idiopathic gout of multiple sites 09/30/2020   Chronic pain syndrome 01/17/2020   Monoarthritis of wrist 10/18/2019   Ganglion cyst 09/21/2019   Hip pain 08/23/2019   Thoracic ascending aortic aneurysm 07/05/2019   Anemia 02/16/2019   Breast cancer ([place])    Acquired absence of left breast 12/20/2018   COPD (chronic obstructive pulmonary disease) ([place]) 11/25/2017   S/P MVR (mitral valve replacement)    Chronic anticoagulation    Tobacco use disorder 02/25/2017   Lymphadenopathy, mediastinal 02/25/2017   Essential hypertension 02/01/2016   Allergic rhinitis 02/01/2016   Healthcare maintenance 01/30/2016   Chronic low back pain 07/22/2015   Pulmonary embolism ([place]) 07/11/2015   Chronic combined systolic and diastolic heart failure ([place]) 07/11/2015   Headache, common migraine 07/11/2015   HLD (hyperlipidemia) 07/11/2015    Conditions to be addressed/monitored: CHF, HTN, and COPD  Care Plan : COPD (Adult)  Updates made by [name], RN since 05/27/2021 12:00 AM     Problem: Symptom Exacerbation (COPD)- call provider when symptoms not improving despite   Priority: High     Long-Range Goal: Symptom Exacerbation Prevented or Minimized to prevent hospitalization or need for emergency treatment   Start Date: 06/20/2020  Recent Progress: On track  Priority: High  Note:   Current Barriers: Successful outreach to patient this morning.  She shared she continues to have pain in her left knee and has been seeing an orthopaedic surgeon who wants to wash out her knee.  She has had several injections in the knee and has had  poor results from the injections.  Verified patient aware of her appointment at clinic at [facility] for a preop clearance with Dr. Marianna Payment.  Patient does not have a scheduled surgical date.  We also discussed the Chantix she received last month, and if she had started with the smoking cessation.  Patient has not started taking the chantix but plans on doing so soon.   Knowledge Deficits related to plan of care for management of CHF, HTN, and COPD  Chronic Disease Management support and education needs related to CHF, HTN, and COPD   RNCM Clinical Goal(s):  Patient will verbalize understanding of plan for management of CHF, HTN, and COPD as evidenced by improving health status  through collaboration with RN Care manager, provider, and care team.   Interventions: 1:1 collaboration with primary care provider regarding development and update of comprehensive plan of care as evidenced by provider attestation and co-signature Inter-disciplinary care team collaboration (see longitudinal plan of care) Evaluation of current treatment plan related to  self management and patient's adherence to plan as established by provider  Heart Failure Interventions:  (Status:  Goal on track:  Yes.) Long Term Goal Assessed need for readable accurate scales in home Advised patient to weigh each morning after emptying bladder Provided patient with education about the role of exercise in the management of heart failure  COPD Interventions:  (Status:  Goal on track:  Yes.) Long Term Goal Provided education about and advised patient to utilize infection prevention strategies to reduce risk of respiratory infection Discussed the importance of adequate rest and management of fatigue with COPD   Hypertension Interventions:  (Status:  Goal on track:  Yes.) Long Term Goal Last practice recorded BP readings:  BP Readings from Last 3 Encounters:  04/23/21 137/82  03/24/21 113/74  03/11/21 98/69  Most recent eGFR/CrCl:  Lab  Results  Component Value Date   EGFR 57 (L) 01/27/2021    No components found for: CRCL  Reviewed medications with patient and discussed importance of compliance Counseled on adverse effects of illicit drug and excessive alcohol use in patients with high blood pressure  Reviewed scheduled/upcoming provider appointments including:  Discussed the importance of smoking cessation.  Patient Goals/Self-Care Activities: Take all medications as prescribed Attend all scheduled provider appointments Call pharmacy for medication refills 3-7 days in advance of running out of medications Perform IADL's (shopping, preparing meals, housekeeping, managing finances) independently Call provider office for new concerns or questions  call office if I gain more than 2 pounds in one day or 5 pounds in one week do ankle pumps when sitting keep legs up while sitting track weight in diary use salt in moderation watch for swelling in feet, ankles and legs every day eat more whole grains, fruits and vegetables, lean meats and healthy fats eliminate smoking in my home identify and avoid work-related triggers begin a symptom diary eliminate symptom triggers at home check blood pressure 3 times per week call doctor for signs and symptoms of high blood pressure keep all doctor appointments take medications for blood pressure exactly as prescribed eat more whole grains, fruits and vegetables, lean meats and healthy fats  Follow Up Plan:  The patient has been provided with contact information for the care management team and has been advised to call with any health related questions or concerns.   Current Barriers:  Knowledge deficits related to basic COPD self care/management-   Case Manager Clinical Goal(s): Over the next 30-60 days, patient will not be hospitalized for COPD exacerbation  Interventions:  Advised patient to self assesses COPD action plan zone and make appointment with provider if in the  yellow zone for 48 hours without improvement. Provided education about and advised patient to utilize infection prevention strategies to reduce risk of respiratory infection   Patient Goals/Self-Care Activities:  eliminate symptom triggers at home - follow rescue plan if symptoms flare-up - keep follow-up appointments - use an extra pillow to sleep if needed Follow Up Plan: The care management team will reach out to the patient again over the next 30-60 days.       Plan: The patient has been provided with contact information for the care management team and has been advised to call with any health related questions or concerns.   [name], RN, BSN, CCM Care Management Coordinator [facility] Phone: [phone]: [phone]

## 2021-05-27 NOTE — Patient Instructions (Addendum)
Visit Information  Thank you for taking time to visit with me today. Please don't hesitate to contact me if I can be of assistance to you before our next scheduled telephone appointment.  Our next appointment is by telephone on 06/25/20 at 0930.  Please call the care guide team at [phone] if you need to cancel or reschedule your appointment.   If you are experiencing a Mental Health or [place] or need someone to talk to, please call the [place] National Suicide Prevention Lifeline: 629 885 5587 or TTY: 270-785-3332 TTY 612-657-4631) to talk to a trained counselor   The patient verbalized understanding of instructions, educational materials, and care plan provided today and declined offer to receive copy of patient instructions, educational materials, and care plan.   The patient has been provided with contact information for the care management team and has been advised to call with any health related questions or concerns.   [name], RN, BSN, CCM Care Management Coordinator [facility] Phone: [phone]: [phone]

## 2021-05-28 ENCOUNTER — Encounter: Payer: Medicare Other | Admitting: Internal Medicine

## 2021-05-29 ENCOUNTER — Encounter: Payer: Medicare Other | Admitting: Internal Medicine

## 2021-06-04 ENCOUNTER — Telehealth: Payer: Self-pay

## 2021-06-04 DIAGNOSIS — Z7901 Long term (current) use of anticoagulants: Secondary | ICD-10-CM

## 2021-06-04 MED ORDER — HYDROCODONE-ACETAMINOPHEN 5-325 MG PO TABS: 1.0000 | ORAL_TABLET | ORAL | 0 refills | Status: DC | PRN

## 2021-06-04 NOTE — Telephone Encounter (Signed)
Notified. 

## 2021-06-04 NOTE — Telephone Encounter (Signed)
Patient requesting a refill:  PMP Report Filled  Drug  QTY  Days  Prescriber  Dispenser  PMP   05/11/2021  Hydrocodone-Acetamin 5-325 Mg 150.00 30 [address] ([id number]) [place]  MEDICATION: Hydrocodone 5-325 MEDICATION:   PHARMACY: [facility] account [facility] on [address]   LAST APPOINTMENT DATE: @10 /18/2022  NEXT APPOINTMENT DATE:@2 /08/2021

## 2021-06-05 ENCOUNTER — Encounter: Payer: Medicare Other | Admitting: Internal Medicine

## 2021-06-06 ENCOUNTER — Telehealth: Payer: Self-pay | Admitting: *Deleted

## 2021-06-06 ENCOUNTER — Telehealth: Payer: Self-pay | Admitting: Pharmacist

## 2021-06-06 ENCOUNTER — Other Ambulatory Visit: Payer: Self-pay

## 2021-06-06 ENCOUNTER — Other Ambulatory Visit: Payer: Self-pay | Admitting: Internal Medicine

## 2021-06-06 ENCOUNTER — Encounter (HOSPITAL_COMMUNITY): Payer: Medicare Other | Admitting: Internal Medicine

## 2021-06-06 ENCOUNTER — Ambulatory Visit (HOSPITAL_COMMUNITY): Payer: Medicare Other

## 2021-06-06 DIAGNOSIS — I5042 Chronic combined systolic (congestive) and diastolic (congestive) heart failure: Secondary | ICD-10-CM

## 2021-06-06 MED ORDER — METOPROLOL SUCCINATE ER 25 MG PO TB24: 25.0000 mg | ORAL_TABLET | Freq: Every day | ORAL | 2 refills | Status: DC

## 2021-06-06 NOTE — Telephone Encounter (Signed)
Patient's daughter has provided PST FS POC INR today 1.9 (target 2.5 - 3.5) on 45mg  warfarin/wk. Will INCREASE to 52.5mg  warfarin/wk as:  1 & 1/2 x 5mg  (7.5mg ) DAILY. Repeat INR 23-DEC-22 and call results to me.

## 2021-06-06 NOTE — Telephone Encounter (Signed)
metoprolol succinate (TOPROL-XL) 25 MG 24 hr tablet, refill request @ [place], [address].

## 2021-06-06 NOTE — Telephone Encounter (Signed)
#  90 with 2 refills sent to [facility] on 04/23/21. Patient notified to call [facility] for refill. She was very [employer] attorney.

## 2021-06-06 NOTE — Telephone Encounter (Signed)
Patient called back stating [facility] does not have this Rx. Spoke with Tyon at [facility] who confirmed they do not have this Rx.

## 2021-06-06 NOTE — Addendum Note (Signed)
Addended by: Velora Heckler on: 06/06/2021 11:53 AM   Modules accepted: Orders

## 2021-06-09 ENCOUNTER — Encounter: Payer: Medicare Other | Admitting: Internal Medicine

## 2021-06-09 NOTE — Progress Notes (Incomplete)
° °  CC: ***  HPI:  Ms.Addeline Paxson is a 70 y.o. with medical history as below presenting to [facility] for ***  Please see problem-based list for further details, assessments, and plans.  Past Medical History:  Diagnosis Date   Arthritis    Asthma    Breast cancer ([place]) 2001   Left Breast Cancer   CHF (congestive heart failure) (HCC)    COPD (chronic obstructive pulmonary disease) (HCC)    Coronary artery disease    History of mitral valve replacement with mechanical valve    HLD (hyperlipidemia)    Hypertension    Pulmonary embolism ([place])    Review of Systems:  Review of system negative unless stated in the problem list or HPI.    Physical Exam:  There were no vitals filed for this visit.  Physical Exam  Assessment & Plan:   See Encounters Tab for problem based charting.  Patient {GC/GE:3044014::"discussed with","seen with"} Dr. {NAMES:[id number]::"Butcher","Guilloud","Hoffman","Mullen","Narendra","Raines","Vincent"} Idamae Schuller, MD   HTN HR: Toprol 25 mg qd, IMDUR 30 mg qd, Hydralazine 25 mg TID, Spironolactone 25 mg qd, Lasix 20mg  qd   Hx of PE HR: Warfarin 5 mg qd   COPD HR: Stiolto inhaler, albuterol inhaler  S/p MVR HR: Warfarin 5 mg qd

## 2021-06-10 DIAGNOSIS — C50912 Malignant neoplasm of unspecified site of left female breast: Secondary | ICD-10-CM | POA: Diagnosis not present

## 2021-06-13 ENCOUNTER — Telehealth: Payer: Self-pay | Admitting: Student

## 2021-06-13 NOTE — Telephone Encounter (Signed)
Received a page from INR lab service about a critical INR of 6.3 for Kim Anthony. Called patient and spoke with daughter, Kim Anthony who informed me that she had already messaged Dr. Elie Confer about the supratherapeutic INR and was advised to hold patient's warfarin for the next 2 days pending further recommendations from him. States patient is doing well and they went out earlier today. Patient has denied any bleeding, dizziness or bruises. No acute complaints.

## 2021-06-16 ENCOUNTER — Telehealth: Payer: Self-pay | Admitting: Pharmacist

## 2021-06-16 NOTE — Telephone Encounter (Signed)
Patient's daughter [name] was contacted and instructed to have HELD/OMIT doses that had been scheduled for Saturday/Sunday 24/25 of December after receiving a text from [facility] on Friday December 23 at 8:02pm, reporting her mother's POC PST FS INR was 6.3  There was no bleeding reported or endorsed but the patient had been commenced on antibiotics (cephalexin the daughter states) the day before (June 12, 2021.). Daughter is to perform a repeat patient self testing, point of care fingerstick INR today, Monday 26-DEC-22 and report the value to me for recommencing dosing instructions.

## 2021-06-16 NOTE — Telephone Encounter (Signed)
Daughter performed FS INR on her mom (patient) = 2.5 (target 2.5 - 3.5), this, after omitting two days of dosing in response to an INR reported to me on Friday 23-DEC-22 at 8:01pm as 6.3 Daughter has been advised to give/instruct her mom 1 and 1/2 x 5mg  (7.5mg ) daily--EXCEPT on Thursday and Sunday, give/take only 5mg  (1 tablet). Repeat INR on Monday 2-JAN-23 and call to me.

## 2021-06-23 ENCOUNTER — Other Ambulatory Visit: Payer: Self-pay | Admitting: Internal Medicine

## 2021-06-23 DIAGNOSIS — Z7901 Long term (current) use of anticoagulants: Secondary | ICD-10-CM

## 2021-06-24 ENCOUNTER — Telehealth: Payer: Self-pay | Admitting: Pharmacist

## 2021-06-24 NOTE — Telephone Encounter (Signed)
Patient's daughter shared results of patient-self testing, finger-stick, point of care INR = 2.1 (target 2.5-3.5) on 47.5mg  warfarin/wk. INCREASED to 52.5mg  warfarin/wk as  (1&1/2 x 5mg  per day). Repeat INR [id number].

## 2021-06-25 ENCOUNTER — Telehealth: Payer: Medicare Other

## 2021-06-25 ENCOUNTER — Ambulatory Visit (INDEPENDENT_AMBULATORY_CARE_PROVIDER_SITE_OTHER): Payer: Commercial Managed Care - HMO | Admitting: Internal Medicine

## 2021-06-25 ENCOUNTER — Encounter: Payer: Self-pay | Admitting: Internal Medicine

## 2021-06-25 DIAGNOSIS — M1712 Unilateral primary osteoarthritis, left knee: Secondary | ICD-10-CM | POA: Diagnosis not present

## 2021-06-25 DIAGNOSIS — Z952 Presence of prosthetic heart valve: Secondary | ICD-10-CM | POA: Diagnosis not present

## 2021-06-25 DIAGNOSIS — Z7901 Long term (current) use of anticoagulants: Secondary | ICD-10-CM

## 2021-06-25 DIAGNOSIS — H1132 Conjunctival hemorrhage, left eye: Secondary | ICD-10-CM | POA: Insufficient documentation

## 2021-06-25 MED ORDER — WARFARIN SODIUM 5 MG PO TABS: ORAL_TABLET | ORAL | 0 refills | Status: DC

## 2021-06-25 NOTE — Assessment & Plan Note (Signed)
Warfarin refilled. 

## 2021-06-25 NOTE — Patient Instructions (Signed)
Thank you, Ms.[name] for allowing [place] to provide your care today. Today we discussed:  Conjunctival hemorrhage: The little bit of bleeding in your eye will self resolve over time.  I am glad the under eye soreness is getting better after icing.  Pre-operation evaluation: We feel that you are medically stable to undergo your knee procedure.  We will document this in our notes.  I will reach out to Dr. Elie Confer to make sure we are on the same page as far as your warfarin dosing before and after the procedure.  My Chart Access: [email or web address]  Please follow-up in 4 months for a routine health visit.  Please make sure to arrive 15 minutes prior to your next appointment. If you arrive late, you may be asked to reschedule.    We look forward to seeing you next time. Please call our clinic at [phone] if you have any questions or concerns. The best time to call is Monday-Friday from 9am-4pm, but there is someone available 24/7. If after hours or the weekend, call the main hospital number and ask for the Internal Medicine Resident On-Call. If you need medication refills, please notify your pharmacy one week in advance and they will send [place] a request.   Thank you for letting us take part in your care. Wishing you the best!  Wayland Denis, MD 06/25/2021, 9:35 AM IM Resident, PGY-1

## 2021-06-25 NOTE — Progress Notes (Signed)
CC: Presurgical evaluation, car door hit her face face  HPI:  [name] is a 71 y.o. female with a past medical history stated below and presents today for presurgical evaluation, car door hit her face face. Please see problem based assessment and plan for additional details.  Past Medical History:  Diagnosis Date   Arthritis    Asthma    Breast cancer ([place]) 2001   Left Breast Cancer   CHF (congestive heart failure) (HCC)    COPD (chronic obstructive pulmonary disease) (HCC)    Coronary artery disease    History of mitral valve replacement with mechanical valve    HLD (hyperlipidemia)    Hypertension    Pulmonary embolism (HCC)     Current Outpatient Medications on File Prior to Visit  Medication Sig Dispense Refill   albuterol (PROVENTIL) (2.5 MG/3ML) 0.083% nebulizer solution USE 1 VIAL IN NEBULIZER EVERY 6 HOURS AS NEEDED FOR WHEEZING FOR SHORTNESS OF BREATH 90 mL 2   albuterol (VENTOLIN HFA) 108 (90 Base) MCG/ACT inhaler Inhale 2 puffs into the lungs every 6 (six) hours as needed for wheezing or shortness of breath. 9 g 2   dapagliflozin propanediol (FARXIGA) 10 MG TABS tablet Take 1 tablet (10 mg total) by mouth daily. 90 tablet 2   famotidine (PEPCID) 20 MG tablet Take 1 tablet (20 mg total) by mouth 2 (two) times daily. 60 tablet 1   fluticasone (FLONASE) 50 MCG/ACT nasal spray Place 1 spray into both nostrils daily as needed for allergies. 9.9 mL 2   furosemide (LASIX) 40 MG tablet Take 1/2 (one-half) tablet by mouth once daily 45 tablet 3   gabapentin (NEURONTIN) 600 MG tablet TAKE 1 & 1/2 (ONE & ONE-HALF) TABLETS BY MOUTH THREE TIMES DAILY FOR  NERVE  PAIN 150 tablet 5   hydrALAZINE (APRESOLINE) 25 MG tablet Take 1 tablet (25 mg total) by mouth 3 (three) times daily. 90 tablet 11   HYDROcodone-acetaminophen (NORCO) 5-325 MG tablet Take 1 tablet by mouth every 4 (four) hours as needed for moderate pain. No more than 5 times  a day 150 tablet 0   isosorbide mononitrate  (IMDUR) 30 MG 24 hr tablet Take 1 tablet (30 mg total) by mouth daily. 90 tablet 2   metoprolol succinate (TOPROL-XL) 25 MG 24 hr tablet Take 1 tablet (25 mg total) by mouth daily. 90 tablet 2   montelukast (SINGULAIR) 10 MG tablet Take 1 tablet (10 mg total) by mouth at bedtime. 90 tablet 2   nicotine (NICODERM CQ - DOSED IN MG/24 HR) 7 mg/24hr patch Place 1 patch (7 mg total) onto the skin daily. 30 patch 0   potassium chloride SA (KLOR-CON) 20 MEQ tablet Take 1 tablet (20 mEq total) by mouth daily. 30 tablet 6   simvastatin (ZOCOR) 20 MG tablet Take 1 tablet (20 mg total) by mouth daily. 90 tablet 2   Spacer/Aero-Holding Chambers (BREATHERITE COLL SPACER ADULT) MISC 1 applicator by Does not apply route daily. 1 each 0   spironolactone (ALDACTONE) 25 MG tablet Take 1 tablet (25 mg total) by mouth daily. 30 tablet 6   Tiotropium Bromide-Olodaterol (STIOLTO RESPIMAT) 2.5-2.5 MCG/ACT AERS Inhale 2 puffs by mouth once daily 4 g 2   varenicline (CHANTIX) 0.5 MG tablet Take 1 tablet (0.5 mg total) by mouth daily for 3 days, THEN 1 tablet (0.5 mg total) 2 (two) times daily for 3 days, THEN 2 tablets (1 mg total) 2 (two) times daily. 345 tablet 0   No  current facility-administered medications on file prior to visit.    Family History  Problem Relation Age of Onset   Hypertension Mother    Cancer Father    Hypertension Father    Breast cancer Maternal Aunt 43   Heart attack Other     Social History: Ambulates without a walker, cane  Review of Systems: ROS negative except for what is noted on the assessment and plan.  Vitals:   06/25/21 0857 06/25/21 0938 06/25/21 0939  BP: (!) 153/92 139/89 139/89  Pulse: 75 72 72  Temp: 98.4 F (36.9 C)    TempSrc: Oral    SpO2: 95%    Weight: 175 lb 4.8 oz (79.5 kg)      Physical Exam: General: Obese african [place] female, NAD HENT: normocephalic, atraumatic, no swelling or bruising of the face, mildly TTP below left eye EYES: Left eye  conjunctival hemorrhage, no scleral icterus CV: regular rate, normal rhythm, mitral valve mechanical closing click. Pulmonary: normal work of breathing on RA, lungs clear to auscultation, no rales, wheezes, rhonchi Abdominal: non-distended, soft, non-tender to palpation, normal BS Skin: Warm and dry, no rashes or lesions Neurological: MS: awake, alert and oriented x3, normal speech and fund of knowledge Motor: moves all extremities antigravity Psych: normal affect   Assessment & Plan:   See Encounters Tab for problem based charting.  Patient discussed with Dr. Dionicia Abler, M.D. [facility], PGY-1 Pager: [phone] Date 06/25/2021 Time 1:12 PM

## 2021-06-25 NOTE — Assessment & Plan Note (Addendum)
Patient has a history of bilateral knee osteoarthritis with left medial meniscus tear with symptoms refractory to physical therapy, rest, activity modification, and multiple corticosteroid injections/Toradol injection. She was was recently seen at [facility] November 2022 after patient tripped and fell on cement causing left knee effusion which further limited her mobility due to pain.  Patient was referred to orthopedics who will move forward with left knee arthroscopy, partial meniscectomy and debridement.  This will be an outpatient procedure with regional anesthesia. Patient presents today for pre op evaluation.  Patient's chronic medical conditions remain stable.  Current daily pain regimen includes Norco 4 times daily and gabapentin 1200mg  three times daily.  Patient has allergy to adhesive tape used to dress surgical sites that causes diffuse rash. Reports no side effects from anesthesia with prior procedures.   [facility] was contacted for clearance [facility] by [facility]. Per Dr. Haroldine Laws, patient is ok to proceed with procedure and will need a lovenox bridge for MVR. [facility] was contacted for clearance since [facility] is managing Warfarin.   Patient is medially optimized at this time and may proceed with procedure. Warfarin will need to be held 5 days prior to procedure and restarted with lovenox bridge with close follow up for Warfarin dosing.

## 2021-06-26 ENCOUNTER — Telehealth: Payer: Self-pay

## 2021-06-26 ENCOUNTER — Telehealth: Payer: Medicaid Other

## 2021-06-26 NOTE — Telephone Encounter (Signed)
°  Care Management   Outreach Note  06/26/2021 Name: Kim Anthony [name]: [id number] DOB: 11-04-1950  Referred by: Marianna Payment, MD Reason for referral : Chronic Care Management   An unsuccessful telephone outreach was attempted today. The patient was referred to the case management team for assistance with care management and care coordination.   Follow Up Plan: If patient returns call to provider office, please advise to call [place] at [phone].  [name], RN, BSN, CCM Care Management Coordinator [facility] Internal Medicine Phone: [phone]: [phone]

## 2021-06-30 NOTE — Progress Notes (Signed)
Internal Medicine Clinic Attending ? ?Case discussed with Dr. Zinoviev  At the time of the visit.  We reviewed the resident?s history and exam and pertinent patient test results.  I agree with the assessment, diagnosis, and plan of care documented in the resident?s note.  ?

## 2021-07-01 ENCOUNTER — Other Ambulatory Visit: Payer: Self-pay | Admitting: Pharmacist

## 2021-07-01 ENCOUNTER — Telehealth: Payer: Self-pay | Admitting: Pharmacist

## 2021-07-01 MED ORDER — ENOXAPARIN SODIUM 80 MG/0.8ML IJ SOSY: 80.0000 mg | PREFILLED_SYRINGE | Freq: Two times a day (BID) | INTRAMUSCULAR | 1 refills | Status: DC

## 2021-07-01 NOTE — Telephone Encounter (Signed)
Daughter states the patient requires a prescription for Lovenox to bridge status-post knee arthroscopic surgery. Will send prescription for enoxaparin 80mg  #10 syringes with instructions.

## 2021-07-08 ENCOUNTER — Telehealth: Payer: Self-pay | Admitting: *Deleted

## 2021-07-08 MED ORDER — HYDROCODONE-ACETAMINOPHEN 5-325 MG PO TABS: 1.0000 | ORAL_TABLET | ORAL | 0 refills | Status: DC | PRN

## 2021-07-08 NOTE — Telephone Encounter (Signed)
Kim Anthony called requesting a refill on her hydrocodone. Per the pmp last filled  06/10/21 and her next appt is 07/25/21.

## 2021-07-10 ENCOUNTER — Other Ambulatory Visit: Payer: Self-pay | Admitting: Internal Medicine

## 2021-07-10 DIAGNOSIS — T7840XA Allergy, unspecified, initial encounter: Secondary | ICD-10-CM

## 2021-07-10 DIAGNOSIS — J449 Chronic obstructive pulmonary disease, unspecified: Secondary | ICD-10-CM

## 2021-07-10 NOTE — Telephone Encounter (Signed)
Refill Request  Pt states she has been trying for 2 weeks to get the following medications refilled.  Patient states she just spoke with the pharmacy again today.   fluticasone (FLONASE) 50 MCG/ACT nasal spray  albuterol (VENTOLIN HFA) 108 (90 Base) MCG/ACT inhaler   [facility] [place], [place] - [address] (Ph: [phone])

## 2021-07-10 NOTE — Telephone Encounter (Signed)
Per chart review, this is the first request for these refills.

## 2021-07-11 MED ORDER — FLUTICASONE PROPIONATE 50 MCG/ACT NA SUSP: 1.0000 | Freq: Every day | NASAL | 2 refills | Status: DC | PRN

## 2021-07-11 MED ORDER — ALBUTEROL SULFATE HFA 108 (90 BASE) MCG/ACT IN AERS: 2.0000 | INHALATION_SPRAY | Freq: Four times a day (QID) | RESPIRATORY_TRACT | 2 refills | Status: DC | PRN

## 2021-07-16 ENCOUNTER — Other Ambulatory Visit: Payer: Self-pay | Admitting: Internal Medicine

## 2021-07-16 DIAGNOSIS — K219 Gastro-esophageal reflux disease without esophagitis: Secondary | ICD-10-CM

## 2021-07-17 ENCOUNTER — Telehealth: Payer: Self-pay

## 2021-07-17 NOTE — Telephone Encounter (Signed)
Rx with 2 refills was sent to Pharmacy on 1/20. Patient notified to contact pharmacy. She was very Patent attorney.

## 2021-07-17 NOTE — Telephone Encounter (Signed)
fluticasone (FLONASE) 50 MCG/ACT nasal spray, REFILL REQUEST @ [place], [place] - [address].

## 2021-07-18 ENCOUNTER — Telehealth: Payer: Self-pay | Admitting: Pharmacist

## 2021-07-18 ENCOUNTER — Telehealth: Payer: Self-pay | Admitting: *Deleted

## 2021-07-18 NOTE — Telephone Encounter (Signed)
Called by Donnald Garre, the Triage Nurse in the [facility] for today, reporting a panic value that she was made aware of this morning and is now making me aware of at 0922h Friday morning, July 18, 2021. This was collected yesterday but only made available to [place] today. The patient/Patient's daughter Kim Anthony) is being called now. Though we had discussed taking "today's dose" as part of her bridge therapy, we will STOP WARFARIN TODAY. I will repeat a patient self-testing point of care INR on Saturday 28-JAN-23 AND Sunday, 29-JAN-23, to be called to me to make certain INR will permit the patient to have her arthroscopy on Monday 30-JAN-23.  Will have the patient drink a bottle of Lipton's Green Tea to decrease INR (along with the four-day "hold" before the procedure on 30-JAN-23). Just spoke with the patient's daughter, Kim Anthony. She will insure that NO WARFARIN is administered TODAY, TOMORROW, Sunday or Monday.

## 2021-07-18 NOTE — Telephone Encounter (Signed)
Received call at 0914 from Caren Griffins with MD INR with Critical INR of 6.6 drawn yesterday. Will notify Dr. Elie Confer.

## 2021-07-18 NOTE — Telephone Encounter (Signed)
Received fax from [facility] with INR results from 02/2121 to 07/17/21. Results placed in Dr. Gladstone Pih office.

## 2021-07-18 NOTE — Telephone Encounter (Signed)
Notified Dr. Elie Confer of critical INR 6.6. He will call patient's daughter now.

## 2021-07-19 ENCOUNTER — Telehealth: Payer: Self-pay | Admitting: Pharmacist

## 2021-07-19 ENCOUNTER — Other Ambulatory Visit: Payer: Self-pay | Admitting: Pharmacist

## 2021-07-19 DIAGNOSIS — Z7901 Long term (current) use of anticoagulants: Secondary | ICD-10-CM

## 2021-07-19 MED ORDER — WARFARIN SODIUM 5 MG PO TABS: ORAL_TABLET | ORAL | 1 refills | Status: DC

## 2021-07-19 NOTE — Telephone Encounter (Signed)
Was apprised by the patient's daughter that Anesthesia wants to defer on this arthroscopy being performed in the outpatient surgical center and instead want the procedure to be performed at [facility] on 15-FEB-23. Given that surgery will not be on 30-JAN-23, I will recommence warfarin therapy. Will give 1&1/2 x 5mg  (7.5mg ) today, and tomorrow. Repeat INR on Monday 30-JAN-23. No bleeding reported by the daughter. Daughter has requested refill on warfarin. Will send prescription electronically.

## 2021-07-21 ENCOUNTER — Ambulatory Visit: Payer: Self-pay | Admitting: Orthopedic Surgery

## 2021-07-21 DIAGNOSIS — S83242D Other tear of medial meniscus, current injury, left knee, subsequent encounter: Secondary | ICD-10-CM

## 2021-07-21 NOTE — H&P (Signed)
Kim Anthony is an 71 y.o. female.   Chief Complaint: left knee pain HPI: Visit For: Follow up Location: bilateral; knee; L>R Duration: 7 weeks Severity: pain level 7/10 Alleviating Factors: Hydrocodone prescribed by pain management does not help Aggravating Factors: prolonged sitting aggravates Associated Symptoms: instability Previous Injections: helped temporarily Patient reports continued giving way of the left knee pain inability to move and do her activities of daily living  Past Medical History:  Diagnosis Date   Arthritis    Asthma    Breast cancer ([place]) 2001   Left Breast Cancer   CHF (congestive heart failure) (HCC)    COPD (chronic obstructive pulmonary disease) ([place])    Coronary artery disease    History of mitral valve replacement with mechanical valve    HLD (hyperlipidemia)    Hypertension    Pulmonary embolism ([place])     Past Surgical History:  Procedure Laterality Date   APPLICATION OF A-CELL OF CHEST/ABDOMEN Left 01/27/2019   Procedure: APPLICATION OF A-CELL OF CHEST;  Surgeon: Wallace Going, DO;  Location: [place];  Service: Plastics;  Laterality: Left;   APPLICATION OF WOUND VAC N/A 01/27/2019   Procedure: APPLICATION OF WOUND VAC;  Surgeon: Wallace Going, DO;  Location: [place];  Service: Plastics;  Laterality: N/A;   COLONOSCOPY WITH PROPOFOL N/A 06/07/2018   Procedure: COLONOSCOPY WITH PROPOFOL;  Surgeon: Wilford Corner, MD;  [facility];  Service: Endoscopy;  Laterality: N/A;   DEBRIDEMENT AND CLOSURE WOUND Left 12/28/2018   Procedure: Debridement And Closure Wound;  Surgeon: Coralie Keens, MD;  Location: [place];  Service: General;  Laterality: Left;   INCISION AND DRAINAGE OF WOUND Left 01/27/2019   Procedure: IRRIGATION AND DEBRIDEMENT OF CHEST WALL;  Surgeon: Wallace Going, DO;  Location: [place];  Service: Plastics;  Laterality: Left;   LATISSIMUS FLAP TO BREAST Left 01/18/2019   Procedure: LEFT LATISSIMUS FLAP TO  BREAST;  Surgeon: Wallace Going, DO;  Location: [place];  Service: Plastics;  Laterality: Left;   MASTECTOMY Left 2000s   for breast cancer, also s/p radiation and chemo   MITRAL VALVE REPLACEMENT     mechanical   POLYPECTOMY  06/07/2018   Procedure: POLYPECTOMY;  Surgeon: Wilford Corner, MD;  [facility];  Service: Endoscopy;;   RIGHT/LEFT HEART CATH AND CORONARY ANGIOGRAPHY N/A 01/09/2021   Procedure: RIGHT/LEFT HEART CATH AND CORONARY ANGIOGRAPHY;  Surgeon: Jolaine Artist, MD;  Location: [place] [facility];  Service: Cardiovascular;  Laterality: N/A;   WOUND DEBRIDEMENT Left 12/28/2018   Procedure: EXCISION OF LEFT CHRONIC CHEST WALL WOUND;  Surgeon: Coralie Keens, MD;  Location: [place];  Service: General;  Laterality: Left;    Family History  Problem Relation Age of Onset   Hypertension Mother    Cancer Father    Hypertension Father    Breast cancer Maternal Aunt 7   Heart attack Other    Social History:  reports that she has been smoking cigarettes. She has a 3.00 pack-year smoking history. She has never used smokeless tobacco. She reports current alcohol use. She reports that she does not use drugs.  Allergies:  Allergies  Allergen Reactions   Lisinopril Swelling   Hydrocodone-Acetaminophen Itching   Acetaminophen-Codeine Itching    Other reaction(s): Other (See Comments)   Propoxyphene Itching    Darvocet   Tape Rash    Plastic   Tramadol Itching, Nausea And Vomiting and Rash   Current medications: albuterol sulfate 2.5 mg/3 mL (0.083 %)  solution for nebulization albuterol sulfate HFA 90 mcg/actuation aerosol inhaler azithromycin 250 mg tablet betamethasone dipropionate 0.05 % topical cream cetirizine 10 mg tablet diclofenac 1 % topical gel fluticasone propionate 50 mcg/actuation nasal spray,suspension furosemide 40 mg tablet gabapentin 600 mg tablet HYDROcodone 5 mg-acetaminophen 325 mg tablet lisinopriL 20 mg tablet metFORMIN 500 mg  tablet metoprolol succinate ER 25 mg tablet,extended release 24 hr OptiChamber Diamond VHC spacer potassium chloride ER 20 mEq tablet,extended release(part/cryst) pregabalin 100 mg capsule pregabalin 50 mg capsule simvastatin 20 mg tablet Stiolto Respimat 2.5 mcg-2.5 mcg/actuation solution for inhalation warfarin 5 mg tablet  Review of Systems  Constitutional: Negative.   HENT: Negative.    Eyes: Negative.   Respiratory: Negative.    Cardiovascular: Negative.   Gastrointestinal: Negative.   Endocrine: Negative.   Genitourinary: Negative.   Musculoskeletal:  Positive for arthralgias, gait problem, joint swelling and myalgias.  Skin: Negative.    There were no vitals taken for this visit. Physical Exam Constitutional:      Appearance: Normal appearance.  HENT:     Head: Normocephalic.     Right Ear: External ear normal.     Left Ear: External ear normal.     Nose: Nose normal.     Mouth/Throat:     Pharynx: Oropharynx is clear.  Eyes:     Conjunctiva/sclera: Conjunctivae normal.  Cardiovascular:     Rate and Rhythm: Normal rate.     Pulses: Normal pulses.  Pulmonary:     Effort: Pulmonary effort is normal.     Breath sounds: Normal breath sounds.  Abdominal:     General: Bowel sounds are normal.  Musculoskeletal:     Cervical back: Normal range of motion.     Comments: Patient is tender in the medial joint line bilaterally. Right knee trace effusion patellofemoral pain compression ranges 0-1 40. No DVT.  Left knee tender medial and lateral joint line as well as patellofemoral pain with compression. Ranges 0-1 30. Equivocal Mc[place]ray  Skin:    General: Skin is warm and dry.  Neurological:     Mental Status: She is alert.     Assessment/Plan 1. Patient with symptomatic osteoarthritis and symptomatic meniscus tear left knee medial with mechanical symptoms refractory to physical therapy, rest, activity modification and multiple injections. 2. Symptomatic  osteoarthritis of the right knee  Plan:  We discussed a repeat injection of the right knee she would like to proceed with that The following applies to the knee: Biomechanically the knee is like a hinge joint. It will extend and it well flex however does not move in the side to side direction. Therefore activities that require that increase the stress on the knee and increase the risk for injury as well as aggravating arthritis if it is present. Activities such as pivoting and side to side maneuvers such as in tennis. Walking on an uneven surface for example will cause a side to side stress to the knee as the ankle accommodates the uneven surface. It also would be appropriate to avoid full squatting as it compresses the soft tissues in the back of the knee which include arteries, veins, and nerves. It is a common cause of tearing of the meniscus which is a cushion that separates the femur from the tibia. Other positions such as the figure four position also increases the stress on the knee. If a flatfoot is present that tends to cause the leg and the knee to move inwards placing more pressure upon the knee. In  those instances a arch support would be helpful. Since the quadricep muscle is a shock absorber for the knee strengthening the quadriceps can help to protect the knee. Doing wall slides at home or straight leg raises can be helpful in strengthening the quadriceps. Since repetitive activities such as running or aerobic activity can cause overuse syndromes in the knee it is recommended to perform those activities every other day rather than every day. If one develops mechanical symptoms such as locking or giving way of the knee an MRI is recommended as this could be indicative of a ligamentous injury such as a ACL tear or a meniscus tear.  For the left knee she is already had the limit of corticosteroid injections as well as a Toradol injection physical therapy activity modification. Reports  significant negative affect to her activities of daily living  We discussed other option which would be knee arthroscopy partial meniscectomy and debridement. She would like to proceed with that. I discussed the risk and benefits of knee arthroscopy including no changes in their symptoms worsening in their symptoms DVT, PE, anesthetic complications etc. Surgical possibilities include chondroplasty, microfracture, partial meniscectomy, plica excision etc. We also discussed the possible need for repeat arthroscopy in the future as well as possible continued treatment including corticosteroid injections and possible Visco supplementation. In addition we discussed the possibility of even eventually required a total knee replacement if significant arthritis encountered. Also indicating that it is an outpatient procedure. 1-2 days on crutches. Postoperative DVT prophylaxis with aspirin if tolerated. Follow-up in the office in 2 weeks following the surgery. Possible consideration of formal of supervised physical therapy as well.  Patient though is on Eliquis for an apparent DVT. She is also had a valve replacement. She would have to be off that 2 days prior to the surgery and resume it the following day. Its unclear whether she will get authorization or clearance for that. She reports she has been off it before and is done fine with it. Given her symptomatology the duration of them and refractory to conservative treatment that would be the only other option. No history of MRSA infection. Tobacco cessation will be required. Indicating the increased risk of blood clots with that.  Plan left knee scope, debridement, partial medial meniscectomy  Cecilie Kicks, PA-C for Dr Tonita Cong 07/21/2021, 8:38 AM

## 2021-07-21 NOTE — Progress Notes (Signed)
Surgery orders requested via Epic inbox. °

## 2021-07-21 NOTE — H&P (View-Only) (Signed)
Kim Anthony is an 71 y.o. female.   Chief Complaint: left knee pain HPI: Visit For: Follow up Location: bilateral; knee; L>R Duration: 7 weeks Severity: pain level 7/10 Alleviating Factors: Hydrocodone prescribed by pain management does not help Aggravating Factors: prolonged sitting aggravates Associated Symptoms: instability Previous Injections: helped temporarily Patient reports continued giving way of the left knee pain inability to move and do her activities of daily living  Past Medical History:  Diagnosis Date   Arthritis    Asthma    Breast cancer ([place]) 2001   Left Breast Cancer   CHF (congestive heart failure) (HCC)    COPD (chronic obstructive pulmonary disease) ([place])    Coronary artery disease    History of mitral valve replacement with mechanical valve    HLD (hyperlipidemia)    Hypertension    Pulmonary embolism ([place])     Past Surgical History:  Procedure Laterality Date   APPLICATION OF A-CELL OF CHEST/ABDOMEN Left 01/27/2019   Procedure: APPLICATION OF A-CELL OF CHEST;  Surgeon: Wallace Going, DO;  Location: [place];  Service: Plastics;  Laterality: Left;   APPLICATION OF WOUND VAC N/A 01/27/2019   Procedure: APPLICATION OF WOUND VAC;  Surgeon: Wallace Going, DO;  Location: [place];  Service: Plastics;  Laterality: N/A;   COLONOSCOPY WITH PROPOFOL N/A 06/07/2018   Procedure: COLONOSCOPY WITH PROPOFOL;  Surgeon: Wilford Corner, MD;  [facility];  Service: Endoscopy;  Laterality: N/A;   DEBRIDEMENT AND CLOSURE WOUND Left 12/28/2018   Procedure: Debridement And Closure Wound;  Surgeon: Coralie Keens, MD;  Location: [place];  Service: General;  Laterality: Left;   INCISION AND DRAINAGE OF WOUND Left 01/27/2019   Procedure: IRRIGATION AND DEBRIDEMENT OF CHEST WALL;  Surgeon: Wallace Going, DO;  Location: [place];  Service: Plastics;  Laterality: Left;   LATISSIMUS FLAP TO BREAST Left 01/18/2019   Procedure: LEFT LATISSIMUS FLAP TO  BREAST;  Surgeon: Wallace Going, DO;  Location: [place];  Service: Plastics;  Laterality: Left;   MASTECTOMY Left 2000s   for breast cancer, also s/p radiation and chemo   MITRAL VALVE REPLACEMENT     mechanical   POLYPECTOMY  06/07/2018   Procedure: POLYPECTOMY;  Surgeon: Wilford Corner, MD;  [facility];  Service: Endoscopy;;   RIGHT/LEFT HEART CATH AND CORONARY ANGIOGRAPHY N/A 01/09/2021   Procedure: RIGHT/LEFT HEART CATH AND CORONARY ANGIOGRAPHY;  Surgeon: Jolaine Artist, MD;  Location: [facility];  Service: Cardiovascular;  Laterality: N/A;   WOUND DEBRIDEMENT Left 12/28/2018   Procedure: EXCISION OF LEFT CHRONIC CHEST WALL WOUND;  Surgeon: Coralie Keens, MD;  Location: [place];  Service: General;  Laterality: Left;    Family History  Problem Relation Age of Onset   Hypertension Mother    Cancer Father    Hypertension Father    Breast cancer Maternal Aunt 33   Heart attack Other    Social History:  reports that she has been smoking cigarettes. She has a 3.00 pack-year smoking history. She has never used smokeless tobacco. She reports current alcohol use. She reports that she does not use drugs.  Allergies:  Allergies  Allergen Reactions   Lisinopril Swelling   Hydrocodone-Acetaminophen Itching   Acetaminophen-Codeine Itching    Other reaction(s): Other (See Comments)   Propoxyphene Itching    Darvocet   Tape Rash    Plastic   Tramadol Itching, Nausea And Vomiting and Rash   Current medications: albuterol sulfate 2.5 mg/3 mL (0.083 %)  solution for nebulization albuterol sulfate HFA 90 mcg/actuation aerosol inhaler azithromycin 250 mg tablet betamethasone dipropionate 0.05 % topical cream cetirizine 10 mg tablet diclofenac 1 % topical gel fluticasone propionate 50 mcg/actuation nasal spray,suspension furosemide 40 mg tablet gabapentin 600 mg tablet HYDROcodone 5 mg-acetaminophen 325 mg tablet lisinopriL 20 mg tablet metFORMIN 500 mg  tablet metoprolol succinate ER 25 mg tablet,extended release 24 hr OptiChamber Diamond VHC spacer potassium chloride ER 20 mEq tablet,extended release(part/cryst) pregabalin 100 mg capsule pregabalin 50 mg capsule simvastatin 20 mg tablet Stiolto Respimat 2.5 mcg-2.5 mcg/actuation solution for inhalation warfarin 5 mg tablet  Review of Systems  Constitutional: Negative.   HENT: Negative.    Eyes: Negative.   Respiratory: Negative.    Cardiovascular: Negative.   Gastrointestinal: Negative.   Endocrine: Negative.   Genitourinary: Negative.   Musculoskeletal:  Positive for arthralgias, gait problem, joint swelling and myalgias.  Skin: Negative.    There were no vitals taken for this visit. Physical Exam Constitutional:      Appearance: Normal appearance.  HENT:     Head: Normocephalic.     Right Ear: External ear normal.     Left Ear: External ear normal.     Nose: Nose normal.     Mouth/Throat:     Pharynx: Oropharynx is clear.  Eyes:     Conjunctiva/sclera: Conjunctivae normal.  Cardiovascular:     Rate and Rhythm: Normal rate.     Pulses: Normal pulses.  Pulmonary:     Effort: Pulmonary effort is normal.     Breath sounds: Normal breath sounds.  Abdominal:     General: Bowel sounds are normal.  Musculoskeletal:     Cervical back: Normal range of motion.     Comments: Patient is tender in the medial joint line bilaterally. Right knee trace effusion patellofemoral pain compression ranges 0-1 40. No DVT.  Left knee tender medial and lateral joint line as well as patellofemoral pain with compression. Ranges 0-1 30. Equivocal Mc[place]ray  Skin:    General: Skin is warm and dry.  Neurological:     Mental Status: She is alert.     Assessment/Plan 1. Patient with symptomatic osteoarthritis and symptomatic meniscus tear left knee medial with mechanical symptoms refractory to physical therapy, rest, activity modification and multiple injections. 2. Symptomatic  osteoarthritis of the right knee  Plan:  We discussed a repeat injection of the right knee she would like to proceed with that The following applies to the knee: Biomechanically the knee is like a hinge joint. It will extend and it well flex however does not move in the side to side direction. Therefore activities that require that increase the stress on the knee and increase the risk for injury as well as aggravating arthritis if it is present. Activities such as pivoting and side to side maneuvers such as in tennis. Walking on an uneven surface for example will cause a side to side stress to the knee as the ankle accommodates the uneven surface. It also would be appropriate to avoid full squatting as it compresses the soft tissues in the back of the knee which include arteries, veins, and nerves. It is a common cause of tearing of the meniscus which is a cushion that separates the femur from the tibia. Other positions such as the figure four position also increases the stress on the knee. If a flatfoot is present that tends to cause the leg and the knee to move inwards placing more pressure upon the knee. In  those instances a arch support would be helpful. Since the quadricep muscle is a shock absorber for the knee strengthening the quadriceps can help to protect the knee. Doing wall slides at home or straight leg raises can be helpful in strengthening the quadriceps. Since repetitive activities such as running or aerobic activity can cause overuse syndromes in the knee it is recommended to perform those activities every other day rather than every day. If one develops mechanical symptoms such as locking or giving way of the knee an MRI is recommended as this could be indicative of a ligamentous injury such as a ACL tear or a meniscus tear.  For the left knee she is already had the limit of corticosteroid injections as well as a Toradol injection physical therapy activity modification. Reports  significant negative affect to her activities of daily living  We discussed other option which would be knee arthroscopy partial meniscectomy and debridement. She would like to proceed with that. I discussed the risk and benefits of knee arthroscopy including no changes in their symptoms worsening in their symptoms DVT, PE, anesthetic complications etc. Surgical possibilities include chondroplasty, microfracture, partial meniscectomy, plica excision etc. We also discussed the possible need for repeat arthroscopy in the future as well as possible continued treatment including corticosteroid injections and possible Visco supplementation. In addition we discussed the possibility of even eventually required a total knee replacement if significant arthritis encountered. Also indicating that it is an outpatient procedure. 1-2 days on crutches. Postoperative DVT prophylaxis with aspirin if tolerated. Follow-up in the office in 2 weeks following the surgery. Possible consideration of formal of supervised physical therapy as well.  Patient though is on Eliquis for an apparent DVT. She is also had a valve replacement. She would have to be off that 2 days prior to the surgery and resume it the following day. Its unclear whether she will get authorization or clearance for that. She reports she has been off it before and is done fine with it. Given her symptomatology the duration of them and refractory to conservative treatment that would be the only other option. No history of MRSA infection. Tobacco cessation will be required. Indicating the increased risk of blood clots with that.  Plan left knee scope, debridement, partial medial meniscectomy  Cecilie Kicks, PA-C for Dr Tonita Cong 07/21/2021, 8:38 AM

## 2021-07-23 ENCOUNTER — Telehealth: Payer: Self-pay | Admitting: Pharmacist

## 2021-07-23 ENCOUNTER — Other Ambulatory Visit: Payer: Self-pay

## 2021-07-23 DIAGNOSIS — M170 Bilateral primary osteoarthritis of knee: Secondary | ICD-10-CM | POA: Diagnosis not present

## 2021-07-23 DIAGNOSIS — J449 Chronic obstructive pulmonary disease, unspecified: Secondary | ICD-10-CM

## 2021-07-23 MED ORDER — ALBUTEROL SULFATE (2.5 MG/3ML) 0.083% IN NEBU: INHALATION_SOLUTION | RESPIRATORY_TRACT | 2 refills | Status: DC

## 2021-07-23 NOTE — Telephone Encounter (Signed)
Daughter had provided me results of an INR at 8:43 PM 31-JAN-23 (was supposed to have been performed 30-JAN-23) = 5.4. Was instructed last night to OMIT dose and repeat INR today 1-FEB-23. Daughter states it will be later tonight.  Notes from last night:  I was sent a text by the patient's daughter, Kim Anthony--at 8:43 PM on the 31-JAN-23 with INR results of 5.4 (Was supposed to have been performed on 30-JAN-23). The daughter/Patient was advised to OMIT dose for 31-JAN-23 (had NOT yet taken per daughter). Repeat INR 1-FEB-23.

## 2021-07-24 ENCOUNTER — Telehealth: Payer: Self-pay | Admitting: Pharmacist

## 2021-07-24 NOTE — Telephone Encounter (Signed)
Responding to an INR provided to me at 0734h today = 3.0 (goal 2.5 - 3.5). This, after omitting two consecutive days of warfarin. Will recommence today with 1&1/2 x 5mg  (7.5mg ) same dosing for Saturday & Sunday, 5mg  ONLY on Friday. Repeat INR 6-FEB-23. No bleeding per daughter reported.

## 2021-07-25 ENCOUNTER — Other Ambulatory Visit: Payer: Self-pay

## 2021-07-25 ENCOUNTER — Encounter: Payer: Self-pay | Admitting: Physical Medicine and Rehabilitation

## 2021-07-25 ENCOUNTER — Encounter
Payer: Medicare Other | Attending: Physical Medicine and Rehabilitation | Admitting: Physical Medicine and Rehabilitation

## 2021-07-25 VITALS — BP 147/96 | HR 77 | Ht 65.0 in

## 2021-07-25 DIAGNOSIS — M5441 Lumbago with sciatica, right side: Secondary | ICD-10-CM | POA: Diagnosis not present

## 2021-07-25 DIAGNOSIS — G894 Chronic pain syndrome: Secondary | ICD-10-CM | POA: Diagnosis not present

## 2021-07-25 DIAGNOSIS — Z79891 Long term (current) use of opiate analgesic: Secondary | ICD-10-CM | POA: Insufficient documentation

## 2021-07-25 DIAGNOSIS — M1712 Unilateral primary osteoarthritis, left knee: Secondary | ICD-10-CM | POA: Insufficient documentation

## 2021-07-25 DIAGNOSIS — Z5181 Encounter for therapeutic drug level monitoring: Secondary | ICD-10-CM | POA: Diagnosis not present

## 2021-07-25 DIAGNOSIS — Z7901 Long term (current) use of anticoagulants: Secondary | ICD-10-CM | POA: Insufficient documentation

## 2021-07-25 DIAGNOSIS — G8929 Other chronic pain: Secondary | ICD-10-CM | POA: Insufficient documentation

## 2021-07-25 MED ORDER — HYDROCODONE-ACETAMINOPHEN 5-325 MG PO TABS: 1.0000 | ORAL_TABLET | ORAL | 0 refills | Status: DC | PRN

## 2021-07-25 NOTE — Patient Instructions (Addendum)
Patient is a 71 yr old female with aortic stenosis s/p AVR on Coumadin,  HTN, hx of PE's on chronic coumadin , CHF, and COPD- here for f/u of R side low back  Pain with sciatica. Due to get ECHO for CHF- still needs to get this.  Tramadol makes her ill as well as Tylenol #3 and Darvocet.   Just got injections on both knees- L knee only numbing meds- and R knee steroid injection- Wednesday 07/23/21.   2.  R foot pain/swelling is resolved from last visit. .   3. Will renew Norco 5/325 mg q4 hours #150- last filled 1/17, but will send in early so can get filled 2/14- before surgery 2/15.    4. If surgeon wants to give Oxycodone for surgery, can do so, x 1 prescription and I will refill from then on and wean back to [facility]- let surgeon know.    5. F/U in 4 months-   6. If surgery doesn't go as well as you planned, strongly suggest discussing knee replacement with surgeon- you aren't too old!

## 2021-07-25 NOTE — Progress Notes (Signed)
Subjective:    Patient ID: Kim Anthony, female    DOB: Dec 30, 1950, 71 y.o.   [name]: [id number]  HPI Patient is a 71 yr old female with aortic stenosis s/p AVR on Coumadin,  HTN, hx of PE's on chronic coumadin , CHF, and COPD- here for f/u of R side low back  Pain with sciatica. Due to get ECHO for CHF;  Tramadol makes her ill as well as Tylenol #3 and Darvocet.  Got R knee injected steroid injection on Wednesday evening at 5pm- was helpful yesterday, but less helpful today. Just got numbing meds in L knee- because getting surgery this month.   Going to wash out L knee 2/15- Is scared to get TKR- scared "too old"-   R foot is better-  Main pain is B/L knees And when stands up- has pain in R low back that radiates down RLE to R knee.   Taking Singulair- PCP will refill- is helpful.   Still on Coumadin- so cannot use NSAIDs.     Pain Inventory Average Pain 7 Pain Right Now 8 My pain is constant, sharp, burning, stabbing, and aching  In the last 24 hours, has pain interfered with the following? General activity 9 Relation with others 9 Enjoyment of life 0 What TIME of day is your pain at its worst? morning  Sleep (in general) Poor  Pain is worse with: walking, inactivity, standing, and some activites Pain improves with: medication Relief from Meds:  fair  Family History  Problem Relation Age of Onset   Hypertension Mother    Cancer Father    Hypertension Father    Breast cancer Maternal Aunt 50   Heart attack Other    Social History   Socioeconomic History   Marital status: Single    Spouse name: Not on file   Number of children: Not on file   Years of education: Not on file   Highest education level: Not on file  Occupational History   Not on file  Tobacco Use   Smoking status: Every Day    Packs/day: 0.10    Years: 30.00    Pack years: 3.00    Types: Cigarettes   Smokeless tobacco: Never   Tobacco comments:    5 cigs per day  Vaping Use   Vaping Use:  Never used  Substance and Sexual Activity   Alcohol use: Yes    Comment: beer- ocasional   Drug use: No   Sexual activity: Not Currently    Birth control/protection: None  Other Topics Concern   Not on file  Social History Narrative   ** Merged History Encounter **       Social Determinants of Health   Financial Resource Strain: Medium Risk   Difficulty of Paying Living Expenses: Somewhat hard  Food Insecurity: Landscape architect Present   Worried About Charity fundraiser in the Last Year: Never true   Ran Out of Food in the Last Year: Sometimes true  Transportation Needs: No Transportation Needs   Lack of Transportation (Medical): No   Lack of Transportation (Non-Medical): No  Physical Activity: Not on file  Stress: Not on file  Social Connections: Not on file   Past Surgical History:  Procedure Laterality Date   APPLICATION OF A-CELL OF CHEST/ABDOMEN Left 01/27/2019   Procedure: APPLICATION OF A-CELL OF CHEST;  Surgeon: Wallace Going, DO;  Location: [place];  Service: Plastics;  Laterality: Left;   APPLICATION OF WOUND VAC N/A 01/27/2019  Procedure: APPLICATION OF WOUND VAC;  Surgeon: Wallace Going, DO;  Location: [place];  Service: Plastics;  Laterality: N/A;   COLONOSCOPY WITH PROPOFOL N/A 06/07/2018   Procedure: COLONOSCOPY WITH PROPOFOL;  Surgeon: Wilford Corner, MD;  Location: [facility];  Service: Endoscopy;  Laterality: N/A;   DEBRIDEMENT AND CLOSURE WOUND Left 12/28/2018   Procedure: Debridement And Closure Wound;  Surgeon: Coralie Keens, MD;  Location: [place];  Service: General;  Laterality: Left;   INCISION AND DRAINAGE OF WOUND Left 01/27/2019   Procedure: IRRIGATION AND DEBRIDEMENT OF CHEST WALL;  Surgeon: Wallace Going, DO;  Location: [place];  Service: Plastics;  Laterality: Left;   LATISSIMUS FLAP TO BREAST Left 01/18/2019   Procedure: LEFT LATISSIMUS FLAP TO BREAST;  Surgeon: Wallace Going, DO;  Location: [employer];  Service:  Plastics;  Laterality: Left;   MASTECTOMY Left 2000s   for breast cancer, also s/p radiation and chemo   MITRAL VALVE REPLACEMENT     mechanical   POLYPECTOMY  06/07/2018   Procedure: POLYPECTOMY;  Surgeon: Wilford Corner, MD;  Location: [facility];  Service: Endoscopy;;   RIGHT/LEFT HEART CATH AND CORONARY ANGIOGRAPHY N/A 01/09/2021   Procedure: RIGHT/LEFT HEART CATH AND CORONARY ANGIOGRAPHY;  Surgeon: Jolaine Artist, MD;  Location: [place] [facility];  Service: Cardiovascular;  Laterality: N/A;   WOUND DEBRIDEMENT Left 12/28/2018   Procedure: EXCISION OF LEFT CHRONIC CHEST WALL WOUND;  Surgeon: Coralie Keens, MD;  Location: [place];  Service: General;  Laterality: Left;   Past Surgical History:  Procedure Laterality Date   APPLICATION OF A-CELL OF CHEST/ABDOMEN Left 01/27/2019   Procedure: APPLICATION OF A-CELL OF CHEST;  Surgeon: Wallace Going, DO;  Location: [place];  Service: Plastics;  Laterality: Left;   APPLICATION OF WOUND VAC N/A 01/27/2019   Procedure: APPLICATION OF WOUND VAC;  Surgeon: Wallace Going, DO;  Location: [place];  Service: Plastics;  Laterality: N/A;   COLONOSCOPY WITH PROPOFOL N/A 06/07/2018   Procedure: COLONOSCOPY WITH PROPOFOL;  Surgeon: Wilford Corner, MD;  [facility];  Service: Endoscopy;  Laterality: N/A;   DEBRIDEMENT AND CLOSURE WOUND Left 12/28/2018   Procedure: Debridement And Closure Wound;  Surgeon: Coralie Keens, MD;  Location: [place];  Service: General;  Laterality: Left;   INCISION AND DRAINAGE OF WOUND Left 01/27/2019   Procedure: IRRIGATION AND DEBRIDEMENT OF CHEST WALL;  Surgeon: Wallace Going, DO;  Location: [place];  Service: Plastics;  Laterality: Left;   LATISSIMUS FLAP TO BREAST Left 01/18/2019   Procedure: LEFT LATISSIMUS FLAP TO BREAST;  Surgeon: Wallace Going, DO;  Location: [place];  Service: Plastics;  Laterality: Left;   MASTECTOMY Left 2000s   for breast cancer, also s/p radiation and  chemo   MITRAL VALVE REPLACEMENT     mechanical   POLYPECTOMY  06/07/2018   Procedure: POLYPECTOMY;  Surgeon: Wilford Corner, MD;  [facility];  Service: Endoscopy;;   RIGHT/LEFT HEART CATH AND CORONARY ANGIOGRAPHY N/A 01/09/2021   Procedure: RIGHT/LEFT HEART CATH AND CORONARY ANGIOGRAPHY;  Surgeon: Jolaine Artist, MD;  Location: [place] [facility];  Service: Cardiovascular;  Laterality: N/A;   WOUND DEBRIDEMENT Left 12/28/2018   Procedure: EXCISION OF LEFT CHRONIC CHEST WALL WOUND;  Surgeon: Coralie Keens, MD;  Location: [place];  Service: General;  Laterality: Left;   Past Medical History:  Diagnosis Date   Arthritis    Asthma    Breast cancer ([place]) 2001   Left Breast Cancer  CHF (congestive heart failure) (HCC)    COPD (chronic obstructive pulmonary disease) (HCC)    Coronary artery disease    History of mitral valve replacement with mechanical valve    HLD (hyperlipidemia)    Hypertension    Pulmonary embolism ([place])    There were no vitals taken for this visit.  Opioid Risk Score:   Fall Risk Score:  `1  Depression screen PHQ 2/9  Depression screen [facility] 2/9 07/25/2021 04/23/2021 03/24/2021 01/27/2021 11/12/2020 08/26/2020 06/11/2020  Decreased Interest 0 0 0 0 0 0 0  Down, Depressed, Hopeless 0 0 0 0 0 0 0  PHQ - 2 Score 0 0 0 0 0 0 0  Altered sleeping - - - - - - -  Tired, decreased energy - - - - - - -  Change in appetite - - - - - - -  Feeling bad or failure about yourself  - - - - - - -  Trouble concentrating - - - - - - -  Moving slowly or fidgety/restless - - - - - - -  Suicidal thoughts - - - - - - -  PHQ-9 Score - - - - - - -  Difficult doing work/chores - - - - - - -  Some recent data might be hidden    Review of Systems  Musculoskeletal:  Positive for gait problem.       Pain in both knees   All other systems reviewed and are negative.     Objective:   Physical Exam  Awake, alert, appropriate- using single point cane, NAD TTP over  medial > lateral aspect of L>>R patella B/L  TTP over R low back around L4-S1- and associated paraspinals.      Assessment & Plan:   Patient is a 71 yr old female with aortic stenosis s/p AVR on Coumadin,  HTN, hx of PE's on chronic coumadin , CHF, and COPD- here for f/u of R side low back  Pain with sciatica. Due to get ECHO for CHF- still needs to get this.  Tramadol makes her ill as well as Tylenol #3 and Darvocet.   Just got injections on both knees- L knee only numbing meds- and R knee steroid injection- Wednesday 07/23/21.   2.  R foot pain/swelling is resolved from last visit. .   3. Will renew Norco 5/325 mg q4 hours #150- last filled 1/17, but will send in early so can get filled 2/14- before surgery 2/15.    4. If surgeon wants to give Oxycodone for surgery, can do so, x 1 prescription and I will refill from then on and wean back to [facility]- let surgeon know.    5. F/U in 4 months-   6. If surgery doesn't go as well as you planned, strongly suggest discussing knee replacement with surgeon- you aren't too old!

## 2021-07-28 ENCOUNTER — Telehealth: Payer: Self-pay | Admitting: Pharmacist

## 2021-07-28 NOTE — Patient Instructions (Addendum)
DUE TO COVID-19 ONLY ONE VISITOR IS ALLOWED TO COME WITH YOU AND STAY IN THE WAITING ROOM ONLY DURING PRE OP AND PROCEDURE DAY OF SURGERY IF YOU ARE GOING HOME AFTER SURGERY. IF YOU ARE SPENDING THE NIGHT 2 PEOPLE MAY VISIT WITH YOU IN YOUR PRIVATE ROOM AFTER SURGERY UNTIL VISITING  HOURS ARE OVER AT 800 PM AND 1  VISITOR  MAY  SPEND THE NIGHT.                 Kim Anthony     Your procedure is scheduled on: 08/06/21   Report to [facility] Main  Entrance   Report to admitting at  9:30 AM     Call this number if you have problems the morning of surgery [phone]   No food after midnight.    You may have clear liquid until 8:45 AM.    At 8:30 AM drink pre surgery drink.   Nothing by mouth after 8:45 AM.    CLEAR LIQUID DIET   Foods Allowed                                                                     Foods Excluded  Coffee and tea, regular and decaf                             liquids that you cannot  Plain Jell-O any favor except red or purple                                           see through such as: Fruit ices (not with fruit pulp)                                     milk, soups, orange juice  Iced Popsicles                                    All solid food Carbonated beverages, regular and diet                                    Cranberry, grape and apple juices Sports drinks like Gatorade Lightly seasoned clear broth or consume(fat free) Sugar     BRUSH YOUR TEETH MORNING OF SURGERY AND RINSE YOUR MOUTH OUT, NO CHEWING GUM CANDY OR MINTS.     Take these medicines the morning of surgery with A SIP OF WATER: Isosorbide, Gabapentin, Hydralazine, Metoprolol, Famotidine,  use your inhaler and bring it with you.  Stop taking Coumadin___________on _2/10_________as instructed by _____________.Check with daugter info on her  phone   Contact your Surgeon/Cardiologist for instructions on Anticoagulant Therapy prior to surgery.     DO NOT TAKE ANY DIABETIC MEDICATIONS DAY OF YOUR SURGERY                               You may not have any metal on your body including hair pins and              piercings  Do not wear jewelry, make-up, lotions, powders or perfumes, deodorant             Do not wear nail polish on your fingernails.  Do not shave  48 hours prior to surgery.             .   Do not bring valuables to the hospital. [place].  Contacts, dentures or bridgework may not be worn into surgery.     Patients discharged the day of surgery will not be allowed to drive home.  IF YOU ARE HAVING SURGERY AND GOING HOME THE SAME DAY, YOU MUST HAVE AN ADULT TO DRIVE YOU HOME AND BE WITH YOU FOR 24 HOURS. YOU MAY GO HOME BY TAXI OR UBER OR ORTHERWISE, BUT AN ADULT MUST ACCOMPANY YOU HOME AND STAY WITH YOU FOR 24 HOURS.  Name and phone number of your driver:  Special Instructions: N/A              Please read over the following fact sheets you were given: _____________________________________________________________________             [facility] - Preparing for Surgery Before surgery, you can play an important role.  Because skin is not sterile, your skin needs to be as free of germs as possible.  You can reduce the number of germs on your skin by washing with CHG (chlorahexidine gluconate) soap before surgery.  CHG is an antiseptic cleaner which kills germs and bonds with the skin to continue killing germs even after washing. Please DO NOT use if you have an allergy to CHG or antibacterial soaps.  If your skin becomes reddened/irritated stop using the CHG and inform your nurse when you arrive at Short Stay. Do not shave (including legs and underarms) for at least 48 hours prior to the first CHG shower.   Please follow these instructions carefully:  1.  Shower  with CHG Soap the night before surgery and the  morning of Surgery.  2.  If you choose to wash your hair, wash your hair first as usual with your  normal  shampoo.  3.  After you shampoo, rinse your hair and body thoroughly to remove the  shampoo.                            4.  Use CHG as you would any other liquid soap.  You can apply chg directly  to the skin and wash                       Gently with a scrungie or clean washcloth.  5.  Apply the CHG Soap to your body ONLY FROM THE NECK  DOWN.   Do not use on face/ open                           Wound or open sores. Avoid contact with eyes, ears mouth and genitals (private parts).                       Wash face,  Genitals (private parts) with your normal soap.             6.  Wash thoroughly, paying special attention to the area where your surgery  will be performed.  7.  Thoroughly rinse your body with warm water from the neck down.  8.  DO NOT shower/wash with your normal soap after using and rinsing off  the CHG Soap.                9.  Pat yourself dry with a clean towel.            10.  Wear clean pajamas.            11.  Place clean sheets on your bed the night of your first shower and do not  sleep with pets. Day of Surgery : Do not apply any lotions/deodorants the morning of surgery.  Please wear clean clothes to the hospital/surgery center.  FAILURE TO FOLLOW THESE INSTRUCTIONS MAY RESULT IN THE CANCELLATION OF YOUR SURGERY  ________________________________________________________________________

## 2021-07-28 NOTE — Telephone Encounter (Signed)
Patient's daughter provided me just now, results of patient self testing, finger stick, point of care INR = 2.2 (target 2.5 - 3.5) on daily weighted (average dose) of 6.875mg  (7.5mg  x 3 days + 5mg .). Daughter has been instructed to provide 1&1/2 x 5mg  (7.5mg ) each day EXCEPT ONLY ONE (1) tablet (5mg ) on Wednesday.   Patient will be having arthroscopic surgery on 15-FEB-23 at [facility].   She has been instructed to take LAST dose of warfarin on Saturday 11-FEB-23. On Sunday, 12-FEB-23, she will commence enoxaparin/Lovenox 80mg  to be administered two inches off of the belly button, at level of waistband, subcutaneously every 12 hours at 8AM and 8PM. LAST DOSE of enoxaparin will be the [place] on Tuesday 14-FEB-23.  Procedure is scheduled for Wednesday 15-FEB-23.   Twenty-four hours AFTER procedure, resume warfarin (1&1/2 x 5mg  tablets) WITH CONCOMITANT enoxaparin (80mg  syringe) and CONTINUE this OVERLAP until the evening dose of Sunday 19-FEB-23.  Return to clinic on Monday 20-FEB-23 at 9:30 AM for INR determination.

## 2021-07-29 ENCOUNTER — Encounter (HOSPITAL_COMMUNITY)
Admission: RE | Admit: 2021-07-29 | Discharge: 2021-07-29 | Disposition: A | Discharge status: Home / Self Care | Payer: Medicare Other | Source: Ambulatory Visit | Attending: Specialist | Admitting: Specialist

## 2021-07-29 ENCOUNTER — Other Ambulatory Visit: Payer: Self-pay

## 2021-07-29 ENCOUNTER — Encounter (HOSPITAL_COMMUNITY): Payer: Self-pay

## 2021-07-29 VITALS — BP 150/88 | HR 89 | Temp 98.7°F | Resp 20 | Ht 65.0 in | Wt 168.0 lb

## 2021-07-29 DIAGNOSIS — I2510 Atherosclerotic heart disease of native coronary artery without angina pectoris: Secondary | ICD-10-CM | POA: Diagnosis not present

## 2021-07-29 DIAGNOSIS — Z01812 Encounter for preprocedural laboratory examination: Secondary | ICD-10-CM | POA: Diagnosis not present

## 2021-07-29 DIAGNOSIS — K219 Gastro-esophageal reflux disease without esophagitis: Secondary | ICD-10-CM | POA: Insufficient documentation

## 2021-07-29 DIAGNOSIS — I110 Hypertensive heart disease with heart failure: Secondary | ICD-10-CM | POA: Diagnosis not present

## 2021-07-29 DIAGNOSIS — X58XXXA Exposure to other specified factors, initial encounter: Secondary | ICD-10-CM | POA: Diagnosis not present

## 2021-07-29 DIAGNOSIS — M1712 Unilateral primary osteoarthritis, left knee: Secondary | ICD-10-CM | POA: Diagnosis not present

## 2021-07-29 DIAGNOSIS — I509 Heart failure, unspecified: Secondary | ICD-10-CM | POA: Insufficient documentation

## 2021-07-29 DIAGNOSIS — F17210 Nicotine dependence, cigarettes, uncomplicated: Secondary | ICD-10-CM | POA: Insufficient documentation

## 2021-07-29 DIAGNOSIS — Z951 Presence of aortocoronary bypass graft: Secondary | ICD-10-CM | POA: Diagnosis not present

## 2021-07-29 DIAGNOSIS — J449 Chronic obstructive pulmonary disease, unspecified: Secondary | ICD-10-CM | POA: Insufficient documentation

## 2021-07-29 DIAGNOSIS — Z952 Presence of prosthetic heart valve: Secondary | ICD-10-CM | POA: Insufficient documentation

## 2021-07-29 DIAGNOSIS — Z01818 Encounter for other preprocedural examination: Secondary | ICD-10-CM

## 2021-07-29 DIAGNOSIS — Z9012 Acquired absence of left breast and nipple: Secondary | ICD-10-CM | POA: Insufficient documentation

## 2021-07-29 DIAGNOSIS — S83242D Other tear of medial meniscus, current injury, left knee, subsequent encounter: Secondary | ICD-10-CM

## 2021-07-29 DIAGNOSIS — Z9221 Personal history of antineoplastic chemotherapy: Secondary | ICD-10-CM | POA: Insufficient documentation

## 2021-07-29 DIAGNOSIS — Z86711 Personal history of pulmonary embolism: Secondary | ICD-10-CM | POA: Insufficient documentation

## 2021-07-29 DIAGNOSIS — Z7901 Long term (current) use of anticoagulants: Secondary | ICD-10-CM | POA: Insufficient documentation

## 2021-07-29 DIAGNOSIS — I10 Essential (primary) hypertension: Secondary | ICD-10-CM

## 2021-07-29 DIAGNOSIS — Z923 Personal history of irradiation: Secondary | ICD-10-CM | POA: Insufficient documentation

## 2021-07-29 DIAGNOSIS — Z853 Personal history of malignant neoplasm of breast: Secondary | ICD-10-CM | POA: Diagnosis not present

## 2021-07-29 DIAGNOSIS — S83242A Other tear of medial meniscus, current injury, left knee, initial encounter: Secondary | ICD-10-CM | POA: Insufficient documentation

## 2021-07-29 HISTORY — DX: Gastro-esophageal reflux disease without esophagitis: K21.9

## 2021-07-29 LAB — BASIC METABOLIC PANEL
Anion gap: 10 (ref 5–15)
BUN: 26 mg/dL — ABNORMAL HIGH (ref 8–23)
CO2: 24 mmol/L (ref 22–32)
Calcium: 8.8 mg/dL — ABNORMAL LOW (ref 8.9–10.3)
Chloride: 102 mmol/L (ref 98–111)
Creatinine, Ser: 0.88 mg/dL (ref 0.44–1.00)
GFR, Estimated: >60 mL/min (ref >60)
Glucose, Bld: 96 mg/dL (ref 70–99)
Potassium: 3.2 mmol/L — ABNORMAL LOW (ref 3.5–5.1)
Sodium: 136 mmol/L (ref 135–145)

## 2021-07-29 LAB — CBC
HCT: 42.4 % (ref 36.0–46.0)
Hemoglobin: 13.8 g/dL (ref 12.0–15.0)
MCH: 32.7 pg (ref 26.0–34.0)
MCHC: 32.5 g/dL (ref 30.0–36.0)
MCV: 100.5 fL — ABNORMAL HIGH (ref 80.0–100.0)
Platelets: 412 10*3/uL — ABNORMAL HIGH (ref 150–400)
RBC: 4.22 MIL/uL (ref 3.87–5.11)
RDW: 12.6 % (ref 11.5–15.5)
WBC: 13.3 10*3/uL — ABNORMAL HIGH (ref 4.0–10.5)
nRBC: 0 % (ref 0.0–0.2)

## 2021-07-29 LAB — SURGICAL PCR SCREEN
MRSA, PCR: NEGATIVE
Staphylococcus aureus: NEGATIVE

## 2021-07-29 NOTE — Progress Notes (Signed)
COVID test- NA   Bowel prep reminder:NA  PCP - Dr. Marianna Payment Cardiologist - Dr. Keturah Barre. Bensimhon  Chest x-ray - 12/21/20-epic EKG - 01/21/21-epic Stress Test - no ECHO - 01/06/21-epic Cardiac Cath - 01/09/21-epic Pacemaker/ICD device last checked:NA  Sleep Study - no CPAP -   Fasting Blood Sugar - no Checks Blood Sugar _____ times a day  Blood Thinner Instructions:Coumadin/ Dr Marianna Payment Aspirin Instructions:stop 08/02/21- Lovenox to start 11/12 Last Dose:  Anesthesia review: yes  Patient denies shortness of breath, fever, cough and chest pain at PAT appointment Pt uses an inhaler daily she does smoke and has some days that she has SOB but not doing house work or with ADLs.  Patient verbalized understanding of instructions that were given to them at the PAT appointment. Patient was also instructed that they will need to review over the PAT instructions again at home before surgery. yes

## 2021-07-30 ENCOUNTER — Ambulatory Visit: Payer: Self-pay | Admitting: Orthopedic Surgery

## 2021-07-30 DIAGNOSIS — S83232D Complex tear of medial meniscus, current injury, left knee, subsequent encounter: Secondary | ICD-10-CM

## 2021-07-30 LAB — DRUG TOX MONITOR 1 W/CONF, ORAL FLD
Amphetamines: NEGATIVE ng/mL (ref <10)
Barbiturates: NEGATIVE ng/mL (ref <10)
Benzodiazepines: NEGATIVE ng/mL (ref <0.50)
Buprenorphine: NEGATIVE ng/mL (ref <0.10)
Cocaine: NEGATIVE ng/mL (ref <5.0)
Codeine: NEGATIVE ng/mL (ref <2.5)
Cotinine: 167.1 ng/mL — ABNORMAL HIGH (ref <5.0)
Dihydrocodeine: 19 ng/mL — ABNORMAL HIGH (ref <2.5)
Fentanyl: NEGATIVE ng/mL (ref <0.10)
Heroin Metabolite: NEGATIVE ng/mL (ref <1.0)
Hydrocodone: >250 ng/mL — ABNORMAL HIGH (ref <2.5)
Hydromorphone: NEGATIVE ng/mL (ref <2.5)
MARIJUANA: NEGATIVE ng/mL (ref <2.5)
MDMA: NEGATIVE ng/mL (ref <10)
Meprobamate: NEGATIVE ng/mL (ref <2.5)
Methadone: NEGATIVE ng/mL (ref <5.0)
Morphine: NEGATIVE ng/mL (ref <2.5)
Nicotine Metabolite: POSITIVE ng/mL — AB (ref <5.0)
Norhydrocodone: 15.2 ng/mL — ABNORMAL HIGH (ref <2.5)
Noroxycodone: NEGATIVE ng/mL (ref <2.5)
Opiates: POSITIVE ng/mL — AB (ref <2.5)
Oxycodone: NEGATIVE ng/mL (ref <2.5)
Oxymorphone: NEGATIVE ng/mL (ref <2.5)
Phencyclidine: NEGATIVE ng/mL (ref <10)
Tapentadol: NEGATIVE ng/mL (ref <5.0)
Tramadol: NEGATIVE ng/mL (ref <5.0)
Zolpidem: NEGATIVE ng/mL (ref <5.0)

## 2021-07-30 LAB — DRUG TOX ALC METAB W/CON, ORAL FLD: Alcohol Metabolite: NEGATIVE ng/mL (ref <25)

## 2021-07-30 NOTE — Anesthesia Preprocedure Evaluation (Addendum)
Anesthesia Evaluation  Patient identified by [name], date of birth, ID band Patient awake    Reviewed: Allergy & Precautions, H&P , NPO status , Patient's Chart, lab work & pertinent test results  Airway Mallampati: II  TM Distance: >3 FB Neck ROM: Full    Dental no notable dental hx. (+) Edentulous Upper, Edentulous Lower, Dental Advisory Given   Pulmonary asthma , COPD,  COPD inhaler, Current Smoker and Patient abstained from smoking.,    Pulmonary exam normal breath sounds clear to auscultation       Cardiovascular hypertension, Pt. on medications and Pt. on home beta blockers + CAD and +CHF   Rhythm:Regular Rate:Normal  S/p MVR   Neuro/Psych  Headaches, negative psych ROS   GI/Hepatic Neg liver ROS, GERD  Medicated,  Endo/Other  negative endocrine ROS  Renal/GU negative Renal ROS  negative genitourinary   Musculoskeletal  (+) Arthritis , Osteoarthritis,    Abdominal   Peds  Hematology  (+) Blood dyscrasia, anemia ,   Anesthesia Other Findings   Reproductive/Obstetrics negative OB ROS                           Anesthesia Physical Anesthesia Plan  ASA: 3  Anesthesia Plan: General   Post-op Pain Management: Regional block and Ofirmev IV (intra-op)   Induction: Intravenous  PONV Risk Score and Plan: 3 and Propofol infusion, TIVA, Ondansetron and Dexamethasone  Airway Management Planned: LMA  Additional Equipment:   Intra-op Plan:   Post-operative Plan: Extubation in OR  Informed Consent: I have reviewed the patients History and Physical, chart, labs and discussed the procedure including the risks, benefits and alternatives for the proposed anesthesia with the patient or authorized representative who has indicated his/her understanding and acceptance.     Dental advisory given  Plan Discussed with: CRNA  Anesthesia Plan Comments: (See PAT note 07/29/2021, Konrad Felix Ward,  PA-C)       Anesthesia Quick Evaluation

## 2021-07-30 NOTE — Progress Notes (Signed)
Anesthesia Chart Review   Case: [id number] Date/Time: 08/06/21 1130   Procedure: KNEE ARTHROSCOPY WITH PARTIAL MEDIAL MENISECTOMY, DEBRIDEMENT (Left: Knee) - 1 HR   Anesthesia type: Choice   Pre-op diagnosis: Left knee medial mensicus tear, degenerative joint disease   Location: [facility]OM 06 / [facility]   Surgeons: Susa Day, MD       DISCUSSION:70 y.o. smoker with h/o GERD, asthma, HTN, CHF, CAD (CABG), s/p mitral valve replacement 2001, COPD, PE (on Coumadin), breast cancer s/p left mastectomy and radiation/chemo, left knee medial meniscus tear, left knee djd scheduled for above procedure 08/06/2021 with Dr. Susa Day.   Per cardiologist, Dr. Haroldine Laws, "Ok to proceed. Will need Lovenox bridge for MVR."  Lovenox bridge bridge instructions given by pharmacist. Last dose of Warfarin 08/02/21.   Anticipate pt can proceed with planned procedure barring acute status change.   VS: BP (!) 150/88    Pulse 89    Temp 37.1 C (Oral)    Resp 20    Ht 5\' 5"  (1.651 m)    Wt 76.2 kg    SpO2 96%    BMI 27.96 kg/m   PROVIDERS: Marianna Payment, MD is PCP   Glori Bickers, MD is Cardiologist  LABS: Labs reviewed: Acceptable for surgery. (all labs ordered are listed, but only abnormal results are displayed)  Labs Reviewed  BASIC METABOLIC PANEL - Abnormal; Notable for the following components:      Result Value   Potassium 3.2 (*)    BUN 26 (*)    Calcium 8.8 (*)    All other components within normal limits  CBC - Abnormal; Notable for the following components:   WBC 13.3 (*)    MCV 100.5 (*)    Platelets 412 (*)    All other components within normal limits  SURGICAL PCR SCREEN     IMAGES:   EKG: 01/21/21 Rate 83 bpm  Sinus rhythm with occasional Premature ventricular complexes Incomplete right bundle branch block Septal infarct , age undetermined lateral T wave changes have improved since 07-Jan-2021  CV: Echo 01/06/2021 1. Left ventricular ejection fraction, by estimation, is  35 to 40%. The  left ventricle has moderately decreased function. The left ventricle has  no regional wall motion abnormalities. Left ventricular diastolic  parameters are indeterminate.   2. Right ventricular systolic function is normal. The right ventricular  size is mildly enlarged. There is mildly elevated pulmonary artery  systolic pressure. The estimated right ventricular systolic pressure is  51.0 mmHg.   3. Left atrial size was moderately dilated.   4. Right atrial size was mildly dilated.   5. The mitral valve has been repaired/replaced. No evidence of mitral  valve regurgitation. No evidence of mitral stenosis. The mean mitral valve  gradient is 4.0 mmHg. There is a mechanical valve present in the mitral  position.   6. Tricuspid valve regurgitation is moderate.   7. The aortic valve is normal in structure. Aortic valve regurgitation is  not visualized. No aortic stenosis is present.   8. Aortic dilatation noted. There is mild dilatation of the aortic root,  measuring 41 mm.   9. The inferior vena cava is normal in size with greater than 50%  respiratory variability, suggesting right atrial pressure of 3 mmHg.  Past Medical History:  Diagnosis Date   Arthritis    Asthma    Breast cancer ([place]) 2001   Left Breast Cancer   CHF (congestive heart failure) (HCC)    COPD (chronic  obstructive pulmonary disease) (HCC)    Coronary artery disease    GERD (gastroesophageal reflux disease)    History of mitral valve replacement with mechanical valve    HLD (hyperlipidemia)    Hypertension    Pulmonary embolism ([place]) 2021    Past Surgical History:  Procedure Laterality Date   APPLICATION OF A-CELL OF CHEST/ABDOMEN Left 01/27/2019   Procedure: APPLICATION OF A-CELL OF CHEST;  Surgeon: Wallace Going, DO;  Location: [place];  Service: Plastics;  Laterality: Left;   APPLICATION OF WOUND VAC N/A 01/27/2019   Procedure: APPLICATION OF WOUND VAC;  Surgeon: Wallace Going,  DO;  Location: [place];  Service: Plastics;  Laterality: N/A;   BREAST SURGERY Left 2001   for CA   COLONOSCOPY WITH PROPOFOL N/A 06/07/2018   Procedure: COLONOSCOPY WITH PROPOFOL;  Surgeon: Wilford Corner, MD;  [facility];  Service: Endoscopy;  Laterality: N/A;   DEBRIDEMENT AND CLOSURE WOUND Left 12/28/2018   Procedure: Debridement And Closure Wound;  Surgeon: Coralie Keens, MD;  Location: [place];  Service: General;  Laterality: Left;   INCISION AND DRAINAGE OF WOUND Left 01/27/2019   Procedure: IRRIGATION AND DEBRIDEMENT OF CHEST WALL;  Surgeon: Wallace Going, DO;  Location: [place];  Service: Plastics;  Laterality: Left;   LATISSIMUS FLAP TO BREAST Left 01/18/2019   Procedure: LEFT LATISSIMUS FLAP TO BREAST;  Surgeon: Wallace Going, DO;  Location: [place];  Service: Plastics;  Laterality: Left;   MITRAL VALVE REPLACEMENT     mechanical   POLYPECTOMY  06/07/2018   Procedure: POLYPECTOMY;  Surgeon: Wilford Corner, MD;  [facility];  Service: Endoscopy;;   RIGHT/LEFT HEART CATH AND CORONARY ANGIOGRAPHY N/A 01/09/2021   Procedure: RIGHT/LEFT HEART CATH AND CORONARY ANGIOGRAPHY;  Surgeon: Jolaine Artist, MD;  Location: [facility];  Service: Cardiovascular;  Laterality: N/A;   WOUND DEBRIDEMENT Left 12/28/2018   Procedure: EXCISION OF LEFT CHRONIC CHEST WALL WOUND;  Surgeon: Coralie Keens, MD;  Location: [place];  Service: General;  Laterality: Left;    MEDICATIONS:  albuterol (PROVENTIL) (2.5 MG/3ML) 0.083% nebulizer solution   albuterol (VENTOLIN HFA) 108 (90 Base) MCG/ACT inhaler   dapagliflozin propanediol (FARXIGA) 10 MG TABS tablet   diphenhydrAMINE (BENADRYL) 25 MG tablet   enoxaparin (LOVENOX) 80 MG/0.8ML injection   famotidine (PEPCID) 20 MG tablet   fluticasone (FLONASE) 50 MCG/ACT nasal spray   furosemide (LASIX) 40 MG tablet   gabapentin (NEURONTIN) 600 MG tablet   hydrALAZINE (APRESOLINE) 25 MG tablet    HYDROcodone-acetaminophen (NORCO) 5-325 MG tablet   isosorbide mononitrate (IMDUR) 30 MG 24 hr tablet   metoprolol succinate (TOPROL-XL) 25 MG 24 hr tablet   montelukast (SINGULAIR) 10 MG tablet   nicotine (NICODERM CQ - DOSED IN MG/24 HR) 7 mg/24hr patch   potassium chloride SA (KLOR-CON) 20 MEQ tablet   simvastatin (ZOCOR) 20 MG tablet   Spacer/Aero-Holding Chambers (BREATHERITE COLL SPACER ADULT) MISC   spironolactone (ALDACTONE) 25 MG tablet   Tiotropium Bromide-Olodaterol (STIOLTO RESPIMAT) 2.5-2.5 MCG/ACT AERS   warfarin (COUMADIN) 5 MG tablet   No current facility-administered medications for this encounter.     Konrad Felix Ward, PA-C [facility] [phone]

## 2021-08-01 ENCOUNTER — Telehealth: Payer: Self-pay | Admitting: *Deleted

## 2021-08-01 ENCOUNTER — Ambulatory Visit (HOSPITAL_COMMUNITY)
Admission: RE | Admit: 2021-08-01 | Discharge: 2021-08-01 | Disposition: A | Discharge status: Home / Self Care | Payer: Medicare Other | Source: Ambulatory Visit | Attending: Internal Medicine | Admitting: Internal Medicine

## 2021-08-01 ENCOUNTER — Other Ambulatory Visit: Payer: Self-pay

## 2021-08-01 ENCOUNTER — Ambulatory Visit (HOSPITAL_BASED_OUTPATIENT_CLINIC_OR_DEPARTMENT_OTHER)
Admission: RE | Admit: 2021-08-01 | Discharge: 2021-08-01 | Disposition: A | Discharge status: Home / Self Care | Payer: Medicare Other | Source: Ambulatory Visit | Attending: Internal Medicine | Admitting: Internal Medicine

## 2021-08-01 VITALS — BP 136/60 | HR 66 | Ht 65.0 in | Wt 169.6 lb

## 2021-08-01 DIAGNOSIS — M25562 Pain in left knee: Secondary | ICD-10-CM | POA: Diagnosis not present

## 2021-08-01 DIAGNOSIS — Z86711 Personal history of pulmonary embolism: Secondary | ICD-10-CM | POA: Insufficient documentation

## 2021-08-01 DIAGNOSIS — Z952 Presence of prosthetic heart valve: Secondary | ICD-10-CM

## 2021-08-01 DIAGNOSIS — Z7901 Long term (current) use of anticoagulants: Secondary | ICD-10-CM | POA: Diagnosis not present

## 2021-08-01 DIAGNOSIS — F17210 Nicotine dependence, cigarettes, uncomplicated: Secondary | ICD-10-CM | POA: Insufficient documentation

## 2021-08-01 DIAGNOSIS — Z7984 Long term (current) use of oral hypoglycemic drugs: Secondary | ICD-10-CM | POA: Diagnosis not present

## 2021-08-01 DIAGNOSIS — I110 Hypertensive heart disease with heart failure: Secondary | ICD-10-CM | POA: Diagnosis not present

## 2021-08-01 DIAGNOSIS — J449 Chronic obstructive pulmonary disease, unspecified: Secondary | ICD-10-CM | POA: Diagnosis not present

## 2021-08-01 DIAGNOSIS — Z79899 Other long term (current) drug therapy: Secondary | ICD-10-CM | POA: Diagnosis not present

## 2021-08-01 DIAGNOSIS — Z951 Presence of aortocoronary bypass graft: Secondary | ICD-10-CM | POA: Diagnosis not present

## 2021-08-01 DIAGNOSIS — Z09 Encounter for follow-up examination after completed treatment for conditions other than malignant neoplasm: Secondary | ICD-10-CM | POA: Diagnosis not present

## 2021-08-01 DIAGNOSIS — I5022 Chronic systolic (congestive) heart failure: Secondary | ICD-10-CM

## 2021-08-01 DIAGNOSIS — I081 Rheumatic disorders of both mitral and tricuspid valves: Secondary | ICD-10-CM | POA: Insufficient documentation

## 2021-08-01 DIAGNOSIS — I2510 Atherosclerotic heart disease of native coronary artery without angina pectoris: Secondary | ICD-10-CM | POA: Insufficient documentation

## 2021-08-01 DIAGNOSIS — I071 Rheumatic tricuspid insufficiency: Secondary | ICD-10-CM | POA: Diagnosis not present

## 2021-08-01 DIAGNOSIS — Z86718 Personal history of other venous thrombosis and embolism: Secondary | ICD-10-CM | POA: Diagnosis not present

## 2021-08-01 DIAGNOSIS — E876 Hypokalemia: Secondary | ICD-10-CM

## 2021-08-01 DIAGNOSIS — Z01810 Encounter for preprocedural cardiovascular examination: Secondary | ICD-10-CM | POA: Diagnosis not present

## 2021-08-01 DIAGNOSIS — I5042 Chronic combined systolic (congestive) and diastolic (congestive) heart failure: Secondary | ICD-10-CM | POA: Diagnosis not present

## 2021-08-01 LAB — ECHOCARDIOGRAM COMPLETE
AR max vel: 1.93 cm2
AV Peak grad: 5.3 mmHg
Ao pk vel: 1.15 m/s
Area-P 1/2: 3.02 cm2
Calc EF: 47.2 %
MV VTI: 1.1 cm2
S' Lateral: 4 cm
Single Plane A2C EF: 50.1 %
Single Plane A4C EF: 48.9 %

## 2021-08-01 MED ORDER — POTASSIUM CHLORIDE CRYS ER 20 MEQ PO TBCR: 40.0000 meq | EXTENDED_RELEASE_TABLET | Freq: Every day | ORAL | 6 refills | Status: DC

## 2021-08-01 NOTE — Telephone Encounter (Signed)
Oral swab drug screen was consistent for prescribed medications.  ?

## 2021-08-01 NOTE — Progress Notes (Signed)
Advanced Heart Failure Clinic Note   PCP: Kim Payment, MD HF Cardiologist: Dr. Haroldine Anthony  HPI: Nyhla Anthony is a 71 y.o. female with a history of mechanical MVR in 2001 in [place], [place], hx breast cancer s/p left mastectomy and radiation/chemo, tobacco use, COPD, prior PE/DVT, HTN, undergoing workup for possible inflammatory arthritis with Rheumatology, prior CABG (she is unaware of prior CAD), and systolic CHF.   EF (2017) 45-50% (no EF on file prior to this). Had improved to 55-60% on Echo in July 2020.  She was admitted 01/05/21 with A/C CHF exacerbation and likely COPD exacerbation.Echo with EF of 35-40%, previously 55-60% on echo July 2020. No significant MR. Mildly enlarged RV. Etiology for decline in EF was not certain so she was scheduled for [facility] to rule out obstructive CAD. R/LHC showed 1v CAD with occluded mRCA and stable hemodynamics.  Today she returns for HF follow up. Main issue is pain in her left knee. Plan scope/washout on 2/15. Lovenox bridge planned. No CP. Denies SOB. No edema , orthopnea or PND. Still smoking 5 cig/day.   Echo today  08/01/21 EF 50-55% RV moderately down. MVR ok Mean gradient Personally reviewed  Cardiac Studies: - Echo (2020): EF 55-60% - Echo (7/22): EF 35-40% (Dr. Jerilee Anthony felt EF 45%) - R/LHC (7/22): with 1v CAD with occluded mRCA, RA 9, PCW 14, CI 2.9.   Past Medical History:  Diagnosis Date   Arthritis    Asthma    Breast cancer ([place]) 2001   Left Breast Cancer   CHF (congestive heart failure) (HCC)    COPD (chronic obstructive pulmonary disease) (HCC)    Coronary artery disease    GERD (gastroesophageal reflux disease)    History of mitral valve replacement with mechanical valve    HLD (hyperlipidemia)    Hypertension    Pulmonary embolism (HCC) 2021   Current Outpatient Medications  Medication Sig Dispense Refill   albuterol (PROVENTIL) (2.5 MG/3ML) 0.083% nebulizer solution USE 1 VIAL IN NEBULIZER EVERY 6 HOURS AS NEEDED FOR  WHEEZING FOR SHORTNESS OF BREATH 90 mL 2   albuterol (VENTOLIN HFA) 108 (90 Base) MCG/ACT inhaler Inhale 2 puffs into the lungs every 6 (six) hours as needed for wheezing or shortness of breath. 9 g 2   dapagliflozin propanediol (FARXIGA) 10 MG TABS tablet Take 1 tablet (10 mg total) by mouth daily. 90 tablet 2   diphenhydrAMINE (BENADRYL) 25 MG tablet Take 75 mg by mouth daily as needed for allergies.     famotidine (PEPCID) 20 MG tablet Take 1 tablet by mouth twice daily 60 tablet 0   fluticasone (FLONASE) 50 MCG/ACT nasal spray Place 1 spray into both nostrils daily as needed for allergies. (Patient taking differently: Place 1 spray into both nostrils daily.) 9.9 mL 2   furosemide (LASIX) 40 MG tablet Take 1/2 (one-half) tablet by mouth once daily 45 tablet 3   gabapentin (NEURONTIN) 600 MG tablet Take 600 mg by mouth 3 (three) times daily.     hydrALAZINE (APRESOLINE) 25 MG tablet Take 1 tablet (25 mg total) by mouth 3 (three) times daily. 90 tablet 11   HYDROcodone-acetaminophen (NORCO) 5-325 MG tablet Take 1 tablet by mouth every 4 (four) hours as needed for moderate pain. Can pick up 2/14 due to surgery 2/15. No more than 5 times a day 150 tablet 0   isosorbide mononitrate (IMDUR) 30 MG 24 hr tablet Take 1 tablet (30 mg total) by mouth daily. 90 tablet 2   metoprolol succinate (  TOPROL-XL) 25 MG 24 hr tablet Take 1 tablet (25 mg total) by mouth daily. 90 tablet 2   montelukast (SINGULAIR) 10 MG tablet Take 1 tablet (10 mg total) by mouth at bedtime. 90 tablet 2   potassium chloride SA (KLOR-CON) 20 MEQ tablet Take 1 tablet (20 mEq total) by mouth daily. 30 tablet 6   simvastatin (ZOCOR) 20 MG tablet Take 1 tablet (20 mg total) by mouth daily. 90 tablet 2   Spacer/Aero-Holding Chambers (BREATHERITE COLL SPACER ADULT) MISC 1 applicator by Does not apply route daily. 1 each 0   spironolactone (ALDACTONE) 25 MG tablet Take 1 tablet (25 mg total) by mouth daily. 30 tablet 6   Tiotropium  Bromide-Olodaterol (STIOLTO RESPIMAT) 2.5-2.5 MCG/ACT AERS Inhale 2 puffs by mouth once daily 4 g 2   warfarin (COUMADIN) 5 MG tablet TAKE 1& 1/2  TABLETS BY MOUTH ONCE DAILY AT 4 PM. (Patient taking differently: Take 5-7.5 mg by mouth See admin instructions.) 42 tablet 1   enoxaparin (LOVENOX) 80 MG/0.8ML injection Inject 0.8 mLs (80 mg total) into the skin every 12 (twelve) hours. Start 24 hours after your surgery. 8 mL 1   nicotine (NICODERM CQ - DOSED IN MG/24 HR) 7 mg/24hr patch Place 1 patch (7 mg total) onto the skin daily. (Patient not taking: Reported on 07/28/2021) 30 patch 0   No current facility-administered medications for this encounter.   Allergies  Allergen Reactions   Lisinopril Swelling   Acetaminophen-Codeine Itching   Propoxyphene Itching    Darvocet   Tape Rash    Plastic   Tramadol Itching, Nausea And Vomiting and Rash   Social History   Socioeconomic History   Marital status: Single    Spouse name: Not on file   Number of children: Not on file   Years of education: Not on file   Highest education level: Not on file  Occupational History   Not on file  Tobacco Use   Smoking status: Every Day    Packs/day: 0.10    Years: 30.00    Pack years: 3.00    Types: Cigarettes   Smokeless tobacco: Never   Tobacco comments:    5 cigs per day  Vaping Use   Vaping Use: Never used  Substance and Sexual Activity   Alcohol use: Yes    Alcohol/week: 3.0 standard drinks    Types: 3 Standard drinks or equivalent per week    Comment: beer- ocasional   Drug use: No   Sexual activity: Not Currently    Birth control/protection: None  Other Topics Concern   Not on file  Social History Narrative   ** Merged History Encounter **       Social Determinants of Health   Financial Resource Strain: Medium Risk   Difficulty of Paying Living Expenses: Somewhat hard  Food Insecurity: Landscape architect Present   Worried About Charity fundraiser in the Last Year: Never true    Ran Out of Food in the Last Year: Sometimes true  Transportation Needs: No Transportation Needs   Lack of Transportation (Medical): No   Lack of Transportation (Non-Medical): No  Physical Activity: Not on file  Stress: Not on file  Social Connections: Not on file  Intimate Partner Violence: Not on file    Family History  Problem Relation Age of Onset   Hypertension Mother    Cancer Father    Hypertension Father    Breast cancer Maternal Aunt 50   Heart attack Other  BP 136/60    Pulse 66    Ht 5\' 5"  (1.651 m)    Wt 76.9 kg (169 lb 9.6 oz)    SpO2 97%    BMI 28.22 kg/m   Wt Readings from Last 3 Encounters:  08/01/21 76.9 kg (169 lb 9.6 oz)  07/29/21 76.2 kg (168 lb)  06/25/21 79.5 kg (175 lb 4.8 oz)   PHYSICAL EXAM: General:  NAD. No resp difficulty HEENT: Normal Neck: Supple. No JVD. Carotids 2+ bilat; no bruits. No lymphadenopathy or thryomegaly appreciated. Cor: PMI nondisplaced. Regular rate & rhythm. No rubs, gallops or murmurs, + mechanical S1 Lungs: Clear Abdomen: Soft, nontender, nondistended. No hepatosplenomegaly. No bruits or masses. Good bowel sounds. Extremities: No cyanosis, clubbing, rash, edema; right wrist brace in place Neuro: Alert & oriented x 3, cranial nerves grossly intact. Moves all 4 extremities w/o difficulty. Affect pleasant.  ReDs: 28%  ASSESSMENT & PLAN: Chronic combined CHF: - Echo (2017): EF 45-50% in setting of PE. Has mild to moderate RV dysfunction. MV prosthesis okay. - Echo (7/22): EF 35-40% this admit (Dr. Jerilee Anthony felt EF 45%), previously 55-60% in 2020.  - R/LHC (01/09/21): with 1v CAD mRCA chronically occluded and well compensated hemodynamics. - Echo today  08/01/21 EF 50-55% RV moderately down. MVR ok Mean gradient 3 Personally reviewed - NYHA II, volume status looks good - Contiue lasix to 20 mg daily. - Continue spiro 25 mg daily. - Continue Toprol XL 25 mg daily. - Continue hydralazine 25 mg tid + Imdur 30 mg daily. -  Continue Farxiga 10 mg daily. - H/o angioedema with ACE-I, so no ARNi either. Could consdier ARB but with normal EF will defer.  - BMET today.  2. CAD  - 1 V CAD with occluded mid RCA on cath (7/22) - No s/s angina - On ASA + beta blocker + statin.     3. Hx of mechanical mitral valve replacement: - Mechanical MVR in [place] in 2001. - Echo (7/22) mean gradient of 4 mmHg with no significant MR. - Echo today  08/01/21 EF 50-55% RV moderately down. MVR ok Mean gradient 3 Personally reviewed - Continue aspirin. - Continue Coumadin.  - Continue SBE prophylaxis   4. Tricuspid valve regurgitation: - Mild to moderate by echo.   5. Hx DVT/PE: - On Coumadin. - INRs followed by [facility]in Clinic.   6. HTN: - Blood pressure well controlled. Continue current regimen.  7. COPD - Continue inhalers. - No wheezes on exam.   8. Smoker: - stressed need for smoking cessation  9. Pre-op cardiac eval for knee surgery - low to moderate risk - ok to proceed - will begin lovenox bridge for mechanical MVR with PharmD tomorrow   Glori Bickers, MD 08/01/21

## 2021-08-01 NOTE — Patient Instructions (Signed)
Increase Potassium   Your physician recommends that you schedule a follow-up appointment in: 1 year, **PLEASE CALL OUR OFFICE IN December 2023 TO SCHEDULE YOUR APPOINTMENT  If you have any questions or concerns before your next appointment please send [place] a message through [place] or call our office at [phone].    TO LEAVE A MESSAGE FOR THE NURSE SELECT OPTION 2, PLEASE LEAVE A MESSAGE INCLUDING: YOUR NAME DATE OF BIRTH CALL BACK NUMBER REASON FOR CALL**this is important as we prioritize the call backs  YOU WILL RECEIVE A CALL BACK THE SAME DAY AS LONG AS YOU CALL BEFORE 4:00 PM

## 2021-08-06 ENCOUNTER — Encounter (HOSPITAL_COMMUNITY): Payer: Self-pay | Admitting: Specialist

## 2021-08-06 ENCOUNTER — Other Ambulatory Visit: Payer: Self-pay

## 2021-08-06 ENCOUNTER — Ambulatory Visit (HOSPITAL_BASED_OUTPATIENT_CLINIC_OR_DEPARTMENT_OTHER): Payer: Medicare Other | Admitting: Certified Registered"

## 2021-08-06 ENCOUNTER — Ambulatory Visit (HOSPITAL_COMMUNITY)
Admission: RE | Admit: 2021-08-06 | Discharge: 2021-08-06 | Disposition: A | Discharge status: Home / Self Care | Payer: Medicare Other | Source: Ambulatory Visit | Attending: Specialist | Admitting: Specialist

## 2021-08-06 ENCOUNTER — Ambulatory Visit (HOSPITAL_COMMUNITY): Payer: Medicare Other | Admitting: Physician Assistant

## 2021-08-06 ENCOUNTER — Encounter (HOSPITAL_COMMUNITY)
Admission: RE | Disposition: A | Discharge status: Home / Self Care | Payer: Self-pay | Source: Ambulatory Visit | Attending: Specialist

## 2021-08-06 DIAGNOSIS — I2510 Atherosclerotic heart disease of native coronary artery without angina pectoris: Secondary | ICD-10-CM | POA: Diagnosis not present

## 2021-08-06 DIAGNOSIS — M170 Bilateral primary osteoarthritis of knee: Secondary | ICD-10-CM | POA: Diagnosis not present

## 2021-08-06 DIAGNOSIS — S83242A Other tear of medial meniscus, current injury, left knee, initial encounter: Secondary | ICD-10-CM | POA: Diagnosis not present

## 2021-08-06 DIAGNOSIS — G8918 Other acute postprocedural pain: Secondary | ICD-10-CM | POA: Diagnosis not present

## 2021-08-06 DIAGNOSIS — S83282A Other tear of lateral meniscus, current injury, left knee, initial encounter: Secondary | ICD-10-CM | POA: Diagnosis not present

## 2021-08-06 DIAGNOSIS — M2242 Chondromalacia patellae, left knee: Secondary | ICD-10-CM | POA: Diagnosis not present

## 2021-08-06 DIAGNOSIS — Z86718 Personal history of other venous thrombosis and embolism: Secondary | ICD-10-CM | POA: Diagnosis not present

## 2021-08-06 DIAGNOSIS — X58XXXA Exposure to other specified factors, initial encounter: Secondary | ICD-10-CM | POA: Diagnosis not present

## 2021-08-06 DIAGNOSIS — M25562 Pain in left knee: Secondary | ICD-10-CM | POA: Diagnosis present

## 2021-08-06 DIAGNOSIS — Z7901 Long term (current) use of anticoagulants: Secondary | ICD-10-CM | POA: Insufficient documentation

## 2021-08-06 DIAGNOSIS — S83232D Complex tear of medial meniscus, current injury, left knee, subsequent encounter: Secondary | ICD-10-CM

## 2021-08-06 DIAGNOSIS — M11262 Other chondrocalcinosis, left knee: Secondary | ICD-10-CM | POA: Diagnosis not present

## 2021-08-06 DIAGNOSIS — M94262 Chondromalacia, left knee: Secondary | ICD-10-CM | POA: Diagnosis not present

## 2021-08-06 HISTORY — PX: KNEE ARTHROSCOPY WITH MEDIAL MENISECTOMY: SHX5651

## 2021-08-06 LAB — CBC WITH DIFFERENTIAL/PLATELET
Abs Immature Granulocytes: 0.09 10*3/uL — ABNORMAL HIGH (ref 0.00–0.07)
Basophils Absolute: 0.1 10*3/uL (ref 0.0–0.1)
Basophils Relative: 1 %
Eosinophils Absolute: 0.2 10*3/uL (ref 0.0–0.5)
Eosinophils Relative: 1 %
HCT: 41.5 % (ref 36.0–46.0)
Hemoglobin: 13.4 g/dL (ref 12.0–15.0)
Immature Granulocytes: 1 %
Lymphocytes Relative: 20 %
Lymphs Abs: 2.9 10*3/uL (ref 0.7–4.0)
MCH: 33.8 pg (ref 26.0–34.0)
MCHC: 32.3 g/dL (ref 30.0–36.0)
MCV: 104.8 fL — ABNORMAL HIGH (ref 80.0–100.0)
Monocytes Absolute: 1 10*3/uL (ref 0.1–1.0)
Monocytes Relative: 7 %
Neutro Abs: 10.3 10*3/uL — ABNORMAL HIGH (ref 1.7–7.7)
Neutrophils Relative %: 70 %
Platelets: 368 10*3/uL (ref 150–400)
RBC: 3.96 MIL/uL (ref 3.87–5.11)
RDW: 12.8 % (ref 11.5–15.5)
WBC: 14.6 10*3/uL — ABNORMAL HIGH (ref 4.0–10.5)
nRBC: 0 % (ref 0.0–0.2)

## 2021-08-06 SURGERY — ARTHROSCOPY, KNEE, WITH MEDIAL MENISCECTOMY
Anesthesia: General | Site: Knee | Laterality: Left

## 2021-08-06 MED ORDER — ONDANSETRON HCL 4 MG/2ML IJ SOLN
INTRAMUSCULAR | Status: DC | PRN
  Administered 2021-08-06: 4 mg via INTRAVENOUS

## 2021-08-06 MED ORDER — BUPIVACAINE-EPINEPHRINE 0.5% -1:200000 IJ SOLN
INTRAMUSCULAR | Status: DC | PRN
  Administered 2021-08-06: 20 mL

## 2021-08-06 MED ORDER — DOCUSATE SODIUM 100 MG PO CAPS: 100.0000 mg | ORAL_CAPSULE | Freq: Two times a day (BID) | ORAL | 1 refills | Status: DC | PRN

## 2021-08-06 MED ORDER — LACTATED RINGERS IV SOLN: INTRAVENOUS | Status: DC

## 2021-08-06 MED ORDER — DEXAMETHASONE SODIUM PHOSPHATE 10 MG/ML IJ SOLN
INTRAMUSCULAR | Status: DC | PRN
  Administered 2021-08-06: 4 mg via INTRAVENOUS

## 2021-08-06 MED ORDER — EPINEPHRINE PF 1 MG/ML IJ SOLN
INTRAMUSCULAR | Status: DC | PRN
  Administered 2021-08-06: 2 mg

## 2021-08-06 MED ORDER — ACETAMINOPHEN 10 MG/ML IV SOLN
INTRAVENOUS | Status: AC
  Filled 2021-08-06: qty 100

## 2021-08-06 MED ORDER — FENTANYL CITRATE PF 50 MCG/ML IJ SOSY
PREFILLED_SYRINGE | INTRAMUSCULAR | Status: AC
  Administered 2021-08-06: 25 ug via INTRAVENOUS
  Filled 2021-08-06: qty 3

## 2021-08-06 MED ORDER — SUCCINYLCHOLINE CHLORIDE 200 MG/10ML IV SOSY
PREFILLED_SYRINGE | INTRAVENOUS | Status: DC | PRN
  Administered 2021-08-06: 100 mg via INTRAVENOUS

## 2021-08-06 MED ORDER — SODIUM CHLORIDE 0.9 % IR SOLN
Status: DC | PRN
  Administered 2021-08-06: 6000 mL

## 2021-08-06 MED ORDER — SUCCINYLCHOLINE CHLORIDE 200 MG/10ML IV SOSY
PREFILLED_SYRINGE | INTRAVENOUS | Status: AC
  Filled 2021-08-06: qty 10

## 2021-08-06 MED ORDER — LIDOCAINE HCL (PF) 2 % IJ SOLN
INTRAMUSCULAR | Status: AC
  Filled 2021-08-06: qty 5

## 2021-08-06 MED ORDER — DEXAMETHASONE SODIUM PHOSPHATE 10 MG/ML IJ SOLN
INTRAMUSCULAR | Status: AC
  Filled 2021-08-06: qty 1

## 2021-08-06 MED ORDER — POLYETHYLENE GLYCOL 3350 17 G PO PACK: 17.0000 g | PACK | Freq: Every day | ORAL | 0 refills | Status: DC

## 2021-08-06 MED ORDER — FENTANYL CITRATE (PF) 100 MCG/2ML IJ SOLN
INTRAMUSCULAR | Status: DC | PRN
  Administered 2021-08-06 (×3): 50 ug via INTRAVENOUS
  Administered 2021-08-06: 25 ug via INTRAVENOUS

## 2021-08-06 MED ORDER — FENTANYL CITRATE (PF) 100 MCG/2ML IJ SOLN
INTRAMUSCULAR | Status: AC
  Filled 2021-08-06: qty 2

## 2021-08-06 MED ORDER — BUPIVACAINE-EPINEPHRINE (PF) 0.5% -1:200000 IJ SOLN
INTRAMUSCULAR | Status: DC | PRN
  Administered 2021-08-06: 20 mL via PERINEURAL

## 2021-08-06 MED ORDER — ORAL CARE MOUTH RINSE: 15.0000 mL | Freq: Once | OROMUCOSAL | Status: DC

## 2021-08-06 MED ORDER — ROCURONIUM BROMIDE 10 MG/ML (PF) SYRINGE
PREFILLED_SYRINGE | INTRAVENOUS | Status: AC
Start: 2021-08-06 — End: ?
  Filled 2021-08-06: qty 10

## 2021-08-06 MED ORDER — BUPIVACAINE-EPINEPHRINE (PF) 0.5% -1:200000 IJ SOLN
INTRAMUSCULAR | Status: AC
  Filled 2021-08-06: qty 30

## 2021-08-06 MED ORDER — PROPOFOL 1000 MG/100ML IV EMUL
INTRAVENOUS | Status: AC
  Filled 2021-08-06: qty 100

## 2021-08-06 MED ORDER — FENTANYL CITRATE PF 50 MCG/ML IJ SOSY
25.0000 ug | PREFILLED_SYRINGE | INTRAMUSCULAR | Status: DC | PRN
  Administered 2021-08-06: 25 ug via INTRAVENOUS
  Administered 2021-08-06: 50 ug via INTRAVENOUS

## 2021-08-06 MED ORDER — OXYCODONE HCL 5 MG PO TABS: 5.0000 mg | ORAL_TABLET | ORAL | 0 refills | Status: DC | PRN

## 2021-08-06 MED ORDER — ROCURONIUM BROMIDE 100 MG/10ML IV SOLN
INTRAVENOUS | Status: DC | PRN
  Administered 2021-08-06: 40 mg via INTRAVENOUS

## 2021-08-06 MED ORDER — FENTANYL CITRATE PF 50 MCG/ML IJ SOSY
PREFILLED_SYRINGE | INTRAMUSCULAR | Status: AC
  Administered 2021-08-06: 50 ug via INTRAVENOUS
  Filled 2021-08-06: qty 2

## 2021-08-06 MED ORDER — BUPIVACAINE-EPINEPHRINE 0.5% -1:200000 IJ SOLN
INTRAMUSCULAR | Status: AC
  Filled 2021-08-06: qty 1

## 2021-08-06 MED ORDER — CEFAZOLIN SODIUM-DEXTROSE 2-4 GM/100ML-% IV SOLN
2.0000 g | INTRAVENOUS | Status: AC
  Administered 2021-08-06: 2 g via INTRAVENOUS
  Filled 2021-08-06: qty 100

## 2021-08-06 MED ORDER — FENTANYL CITRATE PF 50 MCG/ML IJ SOSY: 50.0000 ug | PREFILLED_SYRINGE | INTRAMUSCULAR | Status: DC

## 2021-08-06 MED ORDER — PROPOFOL 500 MG/50ML IV EMUL
INTRAVENOUS | Status: DC | PRN
  Administered 2021-08-06: 150 ug/kg/min via INTRAVENOUS

## 2021-08-06 MED ORDER — EPINEPHRINE PF 1 MG/ML IJ SOLN
INTRAMUSCULAR | Status: AC
  Filled 2021-08-06: qty 2

## 2021-08-06 MED ORDER — PROPOFOL 10 MG/ML IV BOLUS
INTRAVENOUS | Status: DC | PRN
  Administered 2021-08-06: 120 mg via INTRAVENOUS
  Administered 2021-08-06: 30 mg via INTRAVENOUS

## 2021-08-06 MED ORDER — LIDOCAINE 2% (20 MG/ML) 5 ML SYRINGE
INTRAMUSCULAR | Status: DC | PRN
  Administered 2021-08-06: 60 mg via INTRAVENOUS

## 2021-08-06 MED ORDER — PROPOFOL 10 MG/ML IV BOLUS
INTRAVENOUS | Status: AC
  Filled 2021-08-06: qty 20

## 2021-08-06 MED ORDER — CHLORHEXIDINE GLUCONATE 0.12 % MT SOLN: 15.0000 mL | Freq: Once | OROMUCOSAL | Status: DC

## 2021-08-06 MED ORDER — SUGAMMADEX SODIUM 200 MG/2ML IV SOLN
INTRAVENOUS | Status: DC | PRN
  Administered 2021-08-06: 160 mg via INTRAVENOUS

## 2021-08-06 MED ORDER — ACETAMINOPHEN 10 MG/ML IV SOLN
INTRAVENOUS | Status: DC | PRN
  Administered 2021-08-06: 500 mg via INTRAVENOUS

## 2021-08-06 MED ORDER — ONDANSETRON HCL 4 MG/2ML IJ SOLN
INTRAMUSCULAR | Status: AC
  Filled 2021-08-06: qty 2

## 2021-08-06 SURGICAL SUPPLY — 23 items
BAG COUNTER SPONGE SURGICOUNT (BAG) ×2 IMPLANT
BLADE SHAVER TORPEDO 4X13 (MISCELLANEOUS) ×1 IMPLANT
BNDG ELASTIC 6X5.8 VLCR STR LF (GAUZE/BANDAGES/DRESSINGS) ×2 IMPLANT
BOOTIES KNEE HIGH SLOAN (MISCELLANEOUS) ×4 IMPLANT
COVER SURGICAL LIGHT HANDLE (MISCELLANEOUS) ×2 IMPLANT
DRSG PAD ABDOMINAL 8X10 ST (GAUZE/BANDAGES/DRESSINGS) ×2 IMPLANT
DURAPREP 26ML APPLICATOR (WOUND CARE) ×2 IMPLANT
GAUZE SPONGE 4X4 12PLY STRL (GAUZE/BANDAGES/DRESSINGS) ×2 IMPLANT
GLOVE SURG POLYISO LF SZ7.5 (GLOVE) ×2 IMPLANT
GLOVE SURG POLYISO LF SZ8 (GLOVE) ×2 IMPLANT
GLOVE SURG UNDER POLY LF SZ7.5 (GLOVE) ×2 IMPLANT
GOWN STRL REUS W/TWL XL LVL3 (GOWN DISPOSABLE) ×4 IMPLANT
KIT TURNOVER KIT A (KITS) IMPLANT
MANIFOLD NEPTUNE II (INSTRUMENTS) ×2 IMPLANT
PACK ARTHROSCOPY WL (CUSTOM PROCEDURE TRAY) ×2 IMPLANT
PADDING CAST COTTON 6X4 STRL (CAST SUPPLIES) ×2 IMPLANT
PENCIL SMOKE EVACUATOR (MISCELLANEOUS) IMPLANT
SUT ETHILON 4 0 PS 2 18 (SUTURE) ×2 IMPLANT
TOWEL OR 17X26 10 PK STRL BLUE (TOWEL DISPOSABLE) ×2 IMPLANT
TUBING ARTHROSCOPY IRRIG 16FT (MISCELLANEOUS) ×2 IMPLANT
WAND APOLLORF SJ50 AR-9845 (SURGICAL WAND) IMPLANT
WIPE CHG CHLORHEXIDINE 2% (PERSONAL CARE ITEMS) ×2 IMPLANT
WRAP KNEE MAXI GEL POST OP (GAUZE/BANDAGES/DRESSINGS) ×2 IMPLANT

## 2021-08-06 NOTE — Discharge Instructions (Signed)
ARTHROSCOPIC KNEE SURGERY HOME CARE INSTRUCTIONS   PAIN You will be expected to have a moderate amount of pain in the affected knee for approximately two weeks.  However, the first two to four days will be the most severe in terms of the pain you will experience.  Prescriptions have been provided for you to take as needed for the pain.  The pain can be markedly reduced by using the ice/compressive bandage given.  Exchange the ice packs whenever they thaw.  During the night, keep the bandage on because it will still provide some compression for the swelling.  Also, keep the leg elevated on pillows above your heart, and this will help alleviate the pain and swelling.  MEDICATION Prescriptions have been provided to take as needed for pain. To prevent blood clots, take Aspirin 325mg daily with a meal if not on a blood thinner and if no history of stomach ulcers.  ACTIVITY It is preferred that you stay on bedrest for approximately 24 hours.  However, you may go to the bathroom with help.  After this, you can start to be up and about progressively more.  Remember that the swelling may still increase after three to four days if you are up and doing too much.  You may put as much weight on the affected leg as pain will allow.  Use your crutches for comfort and safety.  However, as soon as you are able, you may discard the crutches and go without them.   DRESSING Keep the current dressing as dry as possible.  Two days after your surgery, you may remove the ice/compressive wrap, and surgical dressing.  You may now take a shower, but do not scrub the sounds directly with soap.  Let water rinse over these and gently wipe with your hand.  Reapply band-aids over the puncture wounds and more gauze if needed.  A slight amount of thin drainage can be normal at this time, and do not let it frighten you.  Reapply the ice/compressive wrap.  You may now repeat this every day each time you shower.  SYMPTOMS TO REPORT TO  YOUR DOCTOR  -Extreme pain.  -Extreme swelling.  -Temperature above 101 degrees that does not come down with acetaminophen     (Tylenol).  -Any changes in the feeling, color or movement of your toes.  -Extreme redness, heat, swelling or drainage at your incision  EXERCISE It is preferred that you begin to exercise on the day of your surgery.  Straight leg raises and short arc quads should be begun the afternoon or evening of surgery and continued until you come back for your follow-up appointment.   Attached is an instruction sheet on how to perform these two simple exercises.  Do these at least three times per day if not more.  You may bend your knee as much as is comfortable.  The puncture wounds may occasionally be slightly uncomfortable with bending of the knee.  Do not let this frighten you.  It is important to keep your knee motion, but do not overdo it.  If you have significant pain, simply do not bend the knee as far.   You will be given more exercises to perform at your first return visit.    RETURN APPOINTMENT Please make an appointment to be seen by your doctor in 10-14 days from your surgery.  Patient Signature:  ________________________________________________________  Nurse's Signature:  ________________________________________________________ 

## 2021-08-06 NOTE — Anesthesia Procedure Notes (Signed)
Anesthesia Regional Block: Adductor canal block   Pre-Anesthetic Checklist: , timeout performed,  Correct Patient, Correct Site, Correct Laterality,  Correct Procedure, Correct Position, site marked,  Risks and benefits discussed,  Pre-op evaluation,  At surgeon's request and post-op pain management  Laterality: Left  Prep: Maximum Sterile Barrier Precautions used, chloraprep       Needles:  Injection technique: Single-shot  Needle Type: Echogenic Stimulator Needle     Needle Length: 9cm  Needle Gauge: 21     Additional Needles:   Procedures:,,,, ultrasound used (permanent image in chart),,    Narrative:  Start time: 08/06/2021 10:42 AM End time: 08/06/2021 10:52 AM Injection made incrementally with aspirations every 5 mL.  Performed by: Personally  Anesthesiologist: Roderic Palau, MD

## 2021-08-06 NOTE — Anesthesia Procedure Notes (Signed)
Procedure Name: LMA Insertion Date/Time: 08/06/2021 11:39 AM Performed by: Eben Burow, CRNA Pre-anesthesia Checklist: Patient identified, Emergency Drugs available, Suction available, Patient being monitored and Timeout performed Patient Re-evaluated:Patient Re-evaluated prior to induction Oxygen Delivery Method: Circle system utilized Preoxygenation: Pre-oxygenation with 100% oxygen Induction Type: IV induction LMA: LMA inserted LMA Size: 5.0 Number of attempts: 3 Tube secured with: Tape Dental Injury: Teeth and Oropharynx as per pre-operative assessment  Comments: #4 LMA placed with leak, #4 gastric port LMA placed with leak, #5 LMA placed with better TVs

## 2021-08-06 NOTE — Transfer of Care (Signed)
Immediate Anesthesia Transfer of Care Note  Patient: Kim Anthony  Procedure(s) Performed: KNEE ARTHROSCOPY WITH PARTIAL MEDIAL MENISECTOMY, DEBRIDEMENT (Left: Knee)  Patient Location: PACU  Anesthesia Type:General  Level of Consciousness: awake, alert  and patient cooperative  Airway & Oxygen Therapy: Patient Spontanous Breathing and Patient connected to face mask oxygen  Post-op Assessment: Report given to RN and Post -op Vital signs reviewed and stable  Post vital signs: Reviewed and stable  Last Vitals:  Vitals Value Taken Time  BP 124/68 08/06/21 1304  Temp    Pulse 63 08/06/21 1308  Resp 19 08/06/21 1308  SpO2 100 % 08/06/21 1308  Vitals shown include unvalidated device data.  Last Pain:  Vitals:   08/06/21 1054  TempSrc:   PainSc: 7       Patients Stated Pain Goal: 3 (94/76/54 [id number])  Complications: No notable events documented.

## 2021-08-06 NOTE — Interval H&P Note (Signed)
History and Physical Interval Note:  08/06/2021 10:49 AM  Kim Anthony  has presented today for surgery, with the diagnosis of Left knee medial mensicus tear, degenerative joint disease.  The various methods of treatment have been discussed with the patient and family. After consideration of risks, benefits and other options for treatment, the patient has consented to  Procedure(s) with comments: KNEE ARTHROSCOPY WITH PARTIAL MEDIAL MENISECTOMY, DEBRIDEMENT (Left) - 1 HR as a surgical intervention.  The patient's history has been reviewed, patient examined, no change in status, stable for surgery.  I have reviewed the patient's chart and labs.  Questions were answered to the patient's satisfaction.     Johnn Hai

## 2021-08-06 NOTE — Progress Notes (Signed)
Assisted Dr. Edmond Fitzgerald with left, ultrasound guided, adductor canal block. Side rails up, monitors on throughout procedure. See vital signs in flow sheet. Tolerated Procedure well. 

## 2021-08-06 NOTE — Brief Op Note (Signed)
08/06/2021  11:44 AM  PATIENT:  Kim Anthony  71 y.o. female  PRE-OPERATIVE DIAGNOSIS:  Left knee medial mensicus tear, degenerative joint disease  POST-OPERATIVE DIAGNOSIS:  * No post-op diagnosis entered *  PROCEDURE:  Procedure(s) with comments: KNEE ARTHROSCOPY WITH PARTIAL MEDIAL MENISECTOMY, DEBRIDEMENT (Left) - 1 HR  SURGEON:  Surgeon(s) and Role:    Susa Day, MD - Primary  PHYSICIAN ASSISTANT:   ASSISTANTS: [place]   ANESTHESIA:   general  EBL:  min   BLOOD ADMINISTERED:none  DRAINS: none   LOCAL MEDICATIONS USED:  MARCAINE     SPECIMEN:  No Specimen  DISPOSITION OF SPECIMEN:  N/A  COUNTS:  YES  TOURNIQUET:  * No tourniquets in log *  DICTATION: .Other Dictation: Dictation Number [id number]  PLAN OF CARE: Discharge to home after PACU  PATIENT DISPOSITION:  PACU - hemodynamically stable.   Delay start of Pharmacological VTE agent (>24hrs) due to surgical blood loss or risk of bleeding: no

## 2021-08-06 NOTE — Anesthesia Procedure Notes (Signed)
Procedure Name: Intubation Date/Time: 08/06/2021 11:54 AM Performed by: Eben Burow, CRNA Pre-anesthesia Checklist: Patient identified, Emergency Drugs available, Suction available, Patient being monitored and Timeout performed Patient Re-evaluated:Patient Re-evaluated prior to induction Oxygen Delivery Method: Circle system utilized Preoxygenation: Pre-oxygenation with 100% oxygen Induction Type: IV induction Laryngoscope Size: Mac and 4 Grade View: Grade I Tube type: Oral Tube size: 7.0 mm Number of attempts: 1 Airway Equipment and Method: Stylet Placement Confirmation: ETT inserted through vocal cords under direct vision, positive ETCO2 and breath sounds checked- equal and bilateral Secured at: 21 cm Tube secured with: Tape Dental Injury: Teeth and Oropharynx as per pre-operative assessment

## 2021-08-07 ENCOUNTER — Encounter (HOSPITAL_COMMUNITY): Payer: Self-pay | Admitting: Specialist

## 2021-08-07 NOTE — Op Note (Signed)
NAMEANAELI, CORNWALL MEDICAL RECORD NO: [id number] ACCOUNT NO: [id number] DATE OF BIRTH: 1950-10-05 FACILITY: [facility] LOCATION: [facility] PHYSICIAN: Johnn Hai, MD  Operative Report   DATE OF PROCEDURE: 08/06/2021  PREOPERATIVE DIAGNOSIS:  Medial meniscus tear, left knee.  POSTOPERATIVE DIAGNOSES:   1.  Medial and lateral meniscus tear, left knee. 2.  Chondrocalcinosis. 3.  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  PROCEDURE PERFORMED:   1.  Left knee arthroscopy. 2.  Partial medial and lateral meniscectomy. 3.  Chondroplasty of the medial femoral condyle, lateral femoral condyle, medial and lateral tibial plateau and patella.  ANESTHESIA:  General.  ASSISTANT: Lacie Draft, PA  HISTORY:  A 71 year old female with mechanical symptoms of locking and giving way of the knee.  Patient had tricompartmental degenerative changes, medial meniscus tear.  She was indicated for debridement and partial meniscectomy.  Risks and benefits  discussed including bleeding, infection, damage to neurovascular structures, no change in symptoms, worsening symptoms, DVT, PE, anesthetic complications, etc.  TECHNIQUE:  The patient in supine position.  After induction of adequate general anesthesia, 2 grams Kefzol, left lower extremity was prepped and draped in the usual sterile fashion.  A lateral parapatellar portal was fashioned with a #11 blade.  Ingress  cannula atraumatically placed.  Irrigant was utilized to insufflate the joint 50 mmHg.  Under direct visualization, a medial parapatellar portal was fashioned with a #11 blade after localization with 18-gauge needle, sparing the medial meniscus.  Noted was a complex tear of the posterior third of the medial meniscus with a displaced  fragment.  I introduced a shaver and debrided to a stable base in addition to an ArthroWand.  There were multiple calcific deposits  noted throughout the joint.  Grade 3 chondromalacia of the femoral condyle and tibial plateau were noted.  Light  chondroplasty was performed on both.  Remnant of the meniscus stable to probe palpation.  Suprapatellar pouch has some grade 3 changes of patella.  Light chondroplasty of patella and femoral sulcus.  Lateral compartment revealed a radial tear of the junction between the mid and posterior third of the menisci.  This was excised utilizing a  shaver and ArthroWand.  Grade 3 changes were noted in the femoral condyle and tibial plateau.  No grade 4 changes.  Chondroplasties were performed of both.  Calcific deposits throughout the joint were noted as well.  This was partially debrided.  ACL was unremarkable.  I revisited all compartments.  No further pathology amenable to arthroscopic intervention.  I, therefore, removed all instrumentation.  Portals were closed with 4-0 nylon simple sutures.  0.25% Marcaine with epinephrine was infiltrated in the joint.   Wound was dressed sterilely.  Awoken without difficulty and transported to the recovery room in satisfactory condition.  The patient tolerated the procedure well.  No complications.  Assistant Lacie Draft, [place] was used to place the patient's leg in the figure-of-four position, opened up the lateral compartment, managing inflow and outflow and closure.   VAI D: 08/06/2021 12:50:00 pm T: 08/07/2021 1:04:00 am  JOB: [id number]/ [id number]

## 2021-08-07 NOTE — Anesthesia Postprocedure Evaluation (Signed)
Anesthesia Post Note  Patient: Kim Anthony  Procedure(s) Performed: KNEE ARTHROSCOPY WITH PARTIAL MEDIAL MENISECTOMY, DEBRIDEMENT (Left: Knee)     Patient location during evaluation: PACU Anesthesia Type: General and Regional Level of consciousness: awake and alert Pain management: pain level controlled Vital Signs Assessment: post-procedure vital signs reviewed and stable Respiratory status: spontaneous breathing, nonlabored ventilation and respiratory function stable Cardiovascular status: blood pressure returned to baseline and stable Postop Assessment: no apparent nausea or vomiting Anesthetic complications: no   No notable events documented.  Last Vitals:  Vitals:   08/06/21 1315 08/06/21 1330  BP:  122/88  Pulse: 63 63  Resp: (!) 22 15  Temp:    SpO2: 100% 91%    Last Pain:  Vitals:   08/06/21 1330  TempSrc:   PainSc: 9                  Josphine Laffey,W. EDMOND

## 2021-08-11 ENCOUNTER — Ambulatory Visit (INDEPENDENT_AMBULATORY_CARE_PROVIDER_SITE_OTHER): Payer: Medicare Other | Admitting: Pharmacist

## 2021-08-11 DIAGNOSIS — Z7901 Long term (current) use of anticoagulants: Secondary | ICD-10-CM | POA: Diagnosis not present

## 2021-08-11 DIAGNOSIS — I2609 Other pulmonary embolism with acute cor pulmonale: Secondary | ICD-10-CM

## 2021-08-11 DIAGNOSIS — Z952 Presence of prosthetic heart valve: Secondary | ICD-10-CM

## 2021-08-11 DIAGNOSIS — I2782 Chronic pulmonary embolism: Secondary | ICD-10-CM | POA: Diagnosis not present

## 2021-08-11 LAB — POCT INR: INR: 1.2 — AB (ref 2.0–3.0)

## 2021-08-11 MED ORDER — WARFARIN SODIUM 5 MG PO TABS: ORAL_TABLET | ORAL | 1 refills | Status: DC

## 2021-08-11 MED ORDER — ENOXAPARIN SODIUM 80 MG/0.8ML IJ SOSY: 80.0000 mg | PREFILLED_SYRINGE | Freq: Two times a day (BID) | INTRAMUSCULAR | 1 refills | Status: DC

## 2021-08-11 NOTE — Progress Notes (Signed)
Anticoagulation Management Kim Anthony is a 71 y.o. female who reports to the clinic for monitoring of warfarin treatment.    Indication: PE, history of; Presence of mechanical mitral valve; long term current use of warfarin oral anticoagulant to maintain INR 2.5 - 3.5. Duration: indefinite Supervising physician: Aldine Contes  Anticoagulation Clinic Visit History: Patient does not report signs/symptoms of bleeding or thromboembolism  Other recent changes: No diet, medications, lifestyle changes except as cited in patient findings.  Anticoagulation Episode Summary     Current INR goal:  2.5-3.5  TTR:  26.4 % (5.5 y)  Next INR check:  08/18/2021  INR from last check:    Weekly max warfarin dose:    Target end date:    INR check location:  Anticoagulation Clinic  Preferred lab:    Send INR reminders to:     Indications   Pulmonary embolism (HCC) [I26.99]        Comments:           Allergies  Allergen Reactions   Lisinopril Swelling   Acetaminophen-Codeine Itching   Propoxyphene Itching    Darvocet   Tape Rash    Plastic   Tramadol Itching, Nausea And Vomiting and Rash    Current Outpatient Medications:    albuterol (PROVENTIL) (2.5 MG/3ML) 0.083% nebulizer solution, USE 1 VIAL IN NEBULIZER EVERY 6 HOURS AS NEEDED FOR WHEEZING FOR SHORTNESS OF BREATH, Disp: 90 mL, Rfl: 2   albuterol (VENTOLIN HFA) 108 (90 Base) MCG/ACT inhaler, Inhale 2 puffs into the lungs every 6 (six) hours as needed for wheezing or shortness of breath., Disp: 9 g, Rfl: 2   dapagliflozin propanediol (FARXIGA) 10 MG TABS tablet, Take 1 tablet (10 mg total) by mouth daily., Disp: 90 tablet, Rfl: 2   docusate sodium (COLACE) 100 MG capsule, Take 1 capsule (100 mg total) by mouth 2 (two) times daily as needed for mild constipation., Disp: 30 capsule, Rfl: 1   famotidine (PEPCID) 20 MG tablet, Take 1 tablet by mouth twice daily, Disp: 60 tablet, Rfl: 0   fluticasone (FLONASE) 50 MCG/ACT nasal spray,  Place 1 spray into both nostrils daily as needed for allergies. (Patient taking differently: Place 1 spray into both nostrils daily.), Disp: 9.9 mL, Rfl: 2   furosemide (LASIX) 40 MG tablet, Take 1/2 (one-half) tablet by mouth once daily, Disp: 45 tablet, Rfl: 3   gabapentin (NEURONTIN) 600 MG tablet, Take 600 mg by mouth 3 (three) times daily., Disp: , Rfl:    hydrALAZINE (APRESOLINE) 25 MG tablet, Take 1 tablet (25 mg total) by mouth 3 (three) times daily., Disp: 90 tablet, Rfl: 11   isosorbide mononitrate (IMDUR) 30 MG 24 hr tablet, Take 1 tablet (30 mg total) by mouth daily., Disp: 90 tablet, Rfl: 2   metoprolol succinate (TOPROL-XL) 25 MG 24 hr tablet, Take 1 tablet (25 mg total) by mouth daily., Disp: 90 tablet, Rfl: 2   montelukast (SINGULAIR) 10 MG tablet, Take 1 tablet (10 mg total) by mouth at bedtime., Disp: 90 tablet, Rfl: 2   oxyCODONE (OXY IR/ROXICODONE) 5 MG immediate release tablet, Take 1 tablet (5 mg total) by mouth every 4 (four) hours as needed for severe pain., Disp: 40 tablet, Rfl: 0   polyethylene glycol (MIRALAX / GLYCOLAX) 17 g packet, Take 17 g by mouth daily., Disp: 14 each, Rfl: 0   potassium chloride SA (KLOR-CON M) 20 MEQ tablet, Take 2 tablets (40 mEq total) by mouth daily., Disp: 30 tablet, Rfl: 6  simvastatin (ZOCOR) 20 MG tablet, Take 1 tablet (20 mg total) by mouth daily., Disp: 90 tablet, Rfl: 2   Spacer/Aero-Holding Chambers (BREATHERITE COLL SPACER ADULT) MISC, 1 applicator by Does not apply route daily., Disp: 1 each, Rfl: 0   spironolactone (ALDACTONE) 25 MG tablet, Take 1 tablet (25 mg total) by mouth daily., Disp: 30 tablet, Rfl: 6   Tiotropium Bromide-Olodaterol (STIOLTO RESPIMAT) 2.5-2.5 MCG/ACT AERS, Inhale 2 puffs by mouth once daily, Disp: 4 g, Rfl: 2   diphenhydrAMINE (BENADRYL) 25 MG tablet, Take 75 mg by mouth daily as needed for allergies. (Patient not taking: Reported on 08/11/2021), Disp: , Rfl:    enoxaparin (LOVENOX) 80 MG/0.8ML injection, Inject  0.8 mLs (80 mg total) into the skin every 12 (twelve) hours. Start 24 hours after your surgery., Disp: 8 mL, Rfl: 1   nicotine (NICODERM CQ - DOSED IN MG/24 HR) 7 mg/24hr patch, Place 1 patch (7 mg total) onto the skin daily. (Patient not taking: Reported on 07/28/2021), Disp: 30 patch, Rfl: 0   warfarin (COUMADIN) 5 MG tablet, TAKE 1& 1/2  TABLETS BY MOUTH ONCE DAILY AT 4 PM., Disp: 42 tablet, Rfl: 1 Past Medical History:  Diagnosis Date   Arthritis    Asthma    Breast cancer ([place]) 2001   Left Breast Cancer   CHF (congestive heart failure) (HCC)    COPD (chronic obstructive pulmonary disease) (HCC)    Coronary artery disease    GERD (gastroesophageal reflux disease)    History of mitral valve replacement with mechanical valve    HLD (hyperlipidemia)    Hypertension    Pulmonary embolism ([place]) 2021   Social History   Socioeconomic History   Marital status: Single    Spouse name: Not on file   Number of children: Not on file   Years of education: Not on file   Highest education level: Not on file  Occupational History   Not on file  Tobacco Use   Smoking status: Every Day    Packs/day: 0.10    Years: 30.00    Pack years: 3.00    Types: Cigarettes   Smokeless tobacco: Never   Tobacco comments:    5 cigs per day  Vaping Use   Vaping Use: Never used  Substance and Sexual Activity   Alcohol use: Yes    Alcohol/week: 3.0 standard drinks    Types: 3 Standard drinks or equivalent per week    Comment: beer- ocasional   Drug use: No   Sexual activity: Not Currently    Birth control/protection: None  Other Topics Concern   Not on file  Social History Narrative   ** Merged History Encounter **       Social Determinants of Health   Financial Resource Strain: Medium Risk   Difficulty of Paying Living Expenses: Somewhat hard  Food Insecurity: Landscape architect Present   Worried About Charity fundraiser in the Last Year: Never true   Ran Out of Food in the Last Year: Sometimes  true  Transportation Needs: No Transportation Needs   Lack of Transportation (Medical): No   Lack of Transportation (Non-Medical): No  Physical Activity: Not on file  Stress: Not on file  Social Connections: Not on file   Family History  Problem Relation Age of Onset   Hypertension Mother    Cancer Father    Hypertension Father    Breast cancer Maternal Aunt 50   Heart attack Other     ASSESSMENT Recent Results:  The most recent result is correlated with 7.5mg  warfarin daily that began 4 days ago after her knee arthroplasty performed on 15-FEB-23. She has only been back on warfarin for four days (having not taken today's dose yet). She HAS been injecting with enoxaparin as bridge therapy, which will CONTINUE.  Lab Results  Component Value Date   INR 1.2 (A) 08/11/2021   INR 1.8 (A) 04/23/2021   INR 1.7 (A) 03/24/2021    Anticoagulation Dosing: Description   Take two tablets of your 5mg  peach-colored warfarin tablets today, Monday February 20, Tuesday, February 21. On Wednesday February 22 and Thursday February 23, take 1&1/2 tablets. On Friday February 24, Saturday February 25 and Sunday February 26, take only one (1) tablet. Each day, continue to inject yourself with 80mg  of enoxaparin/Lovenox. Inject two inches away from your belly button, at the level of the waistband. Inject TWICE DAILY.      INR today: Subtherapeutic  PLAN Weekly dose was increased as described in patient instructions. Will CONTINUE enoxaparin bridge therapy.   Patient Instructions  Patient instructed to take medications as defined in the Anti-coagulation Track section of this encounter.  Patient instructed to take today's dose.  Patient instructed to take two tablets of your 5mg  peach-colored warfarin tablets today and Tuesday February 21st. On Wednesday and Thursday, take 1 & 1/2 tablets. On Friday, Saturday and Sunday, take only one (1) tablet. CONTINUE your enoxaparin 80mg  strength syringes, inject  yourself twice daily, two inches away from your belly-button, at the level of your waistband. CONTINUE these injections until seen by me.  Patient verbalized understanding of these instructions.   Patient advised to contact clinic or seek medical attention if signs/symptoms of bleeding or thromboembolism occur.  Patient verbalized understanding by repeating back information and was advised to contact me if further medication-related questions arise. Patient was also provided an information handout.  Follow-up Return in 1 week (on 08/18/2021) for Follow up INR.  Pennie Banter, PharmD, CPP  15 minutes spent face-to-face with the patient during the encounter. 50% of time spent on education, including signs/sx bleeding and clotting, as well as food and drug interactions with warfarin. 50% of time was spent on fingerprick POC INR sample collection,processing, results determination, and documentation in [email or web address]

## 2021-08-11 NOTE — Progress Notes (Signed)
INTERNAL MEDICINE TEACHING ATTENDING ADDENDUM - Aldine Contes M.D  Duration- indefinite, Indication- PE, INR- sub therapeutic. Agree with pharmacy recommendations as outlined in their note.

## 2021-08-11 NOTE — Patient Instructions (Signed)
Patient instructed to take medications as defined in the Anti-coagulation Track section of this encounter.  Patient instructed to take today's dose.  Patient instructed to take two tablets of your 5mg  peach-colored warfarin tablets today and Tuesday February 21st. On Wednesday and Thursday, take 1 & 1/2 tablets. On Friday, Saturday and Sunday, take only one (1) tablet. CONTINUE your enoxaparin 80mg  strength syringes, inject yourself twice daily, two inches away from your belly-button, at the level of your waistband. CONTINUE these injections until seen by me.  Patient verbalized understanding of these instructions.

## 2021-08-13 ENCOUNTER — Telehealth: Payer: Self-pay | Admitting: *Deleted

## 2021-08-13 NOTE — Telephone Encounter (Signed)
Call from [place], MD-INR, reporting INR of 1.2 done on 08/11/21. See Dr Gladstone Pih telephone encounter on 2/20 - this was addressed.

## 2021-08-18 ENCOUNTER — Ambulatory Visit (INDEPENDENT_AMBULATORY_CARE_PROVIDER_SITE_OTHER): Payer: Medicare Other | Admitting: Pharmacist

## 2021-08-18 DIAGNOSIS — Z952 Presence of prosthetic heart valve: Secondary | ICD-10-CM

## 2021-08-18 DIAGNOSIS — Z7901 Long term (current) use of anticoagulants: Secondary | ICD-10-CM | POA: Diagnosis not present

## 2021-08-18 DIAGNOSIS — I2699 Other pulmonary embolism without acute cor pulmonale: Secondary | ICD-10-CM | POA: Diagnosis not present

## 2021-08-18 LAB — POCT INR: INR: 2.1 (ref 2.0–3.0)

## 2021-08-18 NOTE — Progress Notes (Signed)
Anticoagulation Management Kim Anthony is a 71 y.o. female who reports to the clinic for monitoring of warfarin treatment.    Indication: PE and history of mitral valve replacement; target INR 2.5 - 3.5. Long term current use of oral anticoagulant, warfarin.   Duration: indefinite Supervising physician: Aldine Contes  Anticoagulation Clinic Visit History: Patient does not report signs/symptoms of bleeding or thromboembolism  Other recent changes: No diet, medications, lifestyle changes. Anticoagulation Episode Summary     Current INR goal:  2.5-3.5  TTR:  26.3 % (5.5 y)  Next INR check:  08/25/2021  INR from last check:  2.1 (08/18/2021)  Weekly max warfarin dose:    Target end date:    INR check location:  Anticoagulation Clinic  Preferred lab:    Send INR reminders to:     Indications   Pulmonary embolism (HCC) [I26.99]        Comments:           Allergies  Allergen Reactions   Lisinopril Swelling   Acetaminophen-Codeine Itching   Propoxyphene Itching    Darvocet   Tape Rash    Plastic   Tramadol Itching, Nausea And Vomiting and Rash    Current Outpatient Medications:    albuterol (PROVENTIL) (2.5 MG/3ML) 0.083% nebulizer solution, USE 1 VIAL IN NEBULIZER EVERY 6 HOURS AS NEEDED FOR WHEEZING FOR SHORTNESS OF BREATH, Disp: 90 mL, Rfl: 2   albuterol (VENTOLIN HFA) 108 (90 Base) MCG/ACT inhaler, Inhale 2 puffs into the lungs every 6 (six) hours as needed for wheezing or shortness of breath., Disp: 9 g, Rfl: 2   dapagliflozin propanediol (FARXIGA) 10 MG TABS tablet, Take 1 tablet (10 mg total) by mouth daily., Disp: 90 tablet, Rfl: 2   docusate sodium (COLACE) 100 MG capsule, Take 1 capsule (100 mg total) by mouth 2 (two) times daily as needed for mild constipation., Disp: 30 capsule, Rfl: 1   famotidine (PEPCID) 20 MG tablet, Take 1 tablet by mouth twice daily, Disp: 60 tablet, Rfl: 0   fluticasone (FLONASE) 50 MCG/ACT nasal spray, Place 1 spray into both nostrils  daily as needed for allergies. (Patient taking differently: Place 1 spray into both nostrils daily.), Disp: 9.9 mL, Rfl: 2   furosemide (LASIX) 40 MG tablet, Take 1/2 (one-half) tablet by mouth once daily, Disp: 45 tablet, Rfl: 3   gabapentin (NEURONTIN) 600 MG tablet, Take 600 mg by mouth 3 (three) times daily., Disp: , Rfl:    isosorbide mononitrate (IMDUR) 30 MG 24 hr tablet, Take 1 tablet (30 mg total) by mouth daily., Disp: 90 tablet, Rfl: 2   metoprolol succinate (TOPROL-XL) 25 MG 24 hr tablet, Take 1 tablet (25 mg total) by mouth daily., Disp: 90 tablet, Rfl: 2   montelukast (SINGULAIR) 10 MG tablet, Take 1 tablet (10 mg total) by mouth at bedtime., Disp: 90 tablet, Rfl: 2   oxyCODONE (OXY IR/ROXICODONE) 5 MG immediate release tablet, Take 1 tablet (5 mg total) by mouth every 4 (four) hours as needed for severe pain., Disp: 40 tablet, Rfl: 0   polyethylene glycol (MIRALAX / GLYCOLAX) 17 g packet, Take 17 g by mouth daily., Disp: 14 each, Rfl: 0   potassium chloride SA (KLOR-CON M) 20 MEQ tablet, Take 2 tablets (40 mEq total) by mouth daily., Disp: 30 tablet, Rfl: 6   simvastatin (ZOCOR) 20 MG tablet, Take 1 tablet (20 mg total) by mouth daily., Disp: 90 tablet, Rfl: 2   Spacer/Aero-Holding Chambers (BREATHERITE COLL SPACER ADULT) MISC, 1 applicator  by Does not apply route daily., Disp: 1 each, Rfl: 0   spironolactone (ALDACTONE) 25 MG tablet, Take 1 tablet (25 mg total) by mouth daily., Disp: 30 tablet, Rfl: 6   Tiotropium Bromide-Olodaterol (STIOLTO RESPIMAT) 2.5-2.5 MCG/ACT AERS, Inhale 2 puffs by mouth once daily, Disp: 4 g, Rfl: 2   warfarin (COUMADIN) 5 MG tablet, TAKE 1& 1/2  TABLETS BY MOUTH ONCE DAILY AT 4 PM., Disp: 42 tablet, Rfl: 1   diphenhydrAMINE (BENADRYL) 25 MG tablet, Take 75 mg by mouth daily as needed for allergies. (Patient not taking: Reported on 08/11/2021), Disp: , Rfl:    enoxaparin (LOVENOX) 80 MG/0.8ML injection, Inject 0.8 mLs (80 mg total) into the skin every 12  (twelve) hours. Start 24 hours after your surgery. (Patient not taking: Reported on 08/18/2021), Disp: 8 mL, Rfl: 1   hydrALAZINE (APRESOLINE) 25 MG tablet, Take 1 tablet (25 mg total) by mouth 3 (three) times daily., Disp: 90 tablet, Rfl: 11   nicotine (NICODERM CQ - DOSED IN MG/24 HR) 7 mg/24hr patch, Place 1 patch (7 mg total) onto the skin daily. (Patient not taking: Reported on 07/28/2021), Disp: 30 patch, Rfl: 0 Past Medical History:  Diagnosis Date   Arthritis    Asthma    Breast cancer ([place]) 2001   Left Breast Cancer   CHF (congestive heart failure) (HCC)    COPD (chronic obstructive pulmonary disease) (HCC)    Coronary artery disease    GERD (gastroesophageal reflux disease)    History of mitral valve replacement with mechanical valve    HLD (hyperlipidemia)    Hypertension    Pulmonary embolism ([place]) 2021   Social History   Socioeconomic History   Marital status: Single    Spouse name: Not on file   Number of children: Not on file   Years of education: Not on file   Highest education level: Not on file  Occupational History   Not on file  Tobacco Use   Smoking status: Every Day    Packs/day: 0.10    Years: 30.00    Pack years: 3.00    Types: Cigarettes   Smokeless tobacco: Never   Tobacco comments:    5 cigs per day  Vaping Use   Vaping Use: Never used  Substance and Sexual Activity   Alcohol use: Yes    Alcohol/week: 3.0 standard drinks    Types: 3 Standard drinks or equivalent per week    Comment: beer- ocasional   Drug use: No   Sexual activity: Not Currently    Birth control/protection: None  Other Topics Concern   Not on file  Social History Narrative   ** Merged History Encounter **       Social Determinants of Health   Financial Resource Strain: Medium Risk   Difficulty of Paying Living Expenses: Somewhat hard  Food Insecurity: Landscape architect Present   Worried About Charity fundraiser in the Last Year: Never true   Ran Out of Food in the Last  Year: Sometimes true  Transportation Needs: No Transportation Needs   Lack of Transportation (Medical): No   Lack of Transportation (Non-Medical): No  Physical Activity: Not on file  Stress: Not on file  Social Connections: Not on file   Family History  Problem Relation Age of Onset   Hypertension Mother    Cancer Father    Hypertension Father    Breast cancer Maternal Aunt 50   Heart attack Other     ASSESSMENT Recent Results:  The most recent result is correlated with 50 mg per week with overlapping bridge therapy of enoxaparin. Will discontinue enoxaparin and continue warfarin at same regimen.  Lab Results  Component Value Date   INR 2.1 08/18/2021   INR 1.2 (A) 08/11/2021   INR 1.8 (A) 04/23/2021    Anticoagulation Dosing: Description   Take two of your 5mg  peach-colored warfarin tablets on Monday and Tuesday. On Wednesday, Thursday and Friday--take 1&1/2 tablets. On Saturday and Sunday, take only one (1) tablet. Patient will perform patient self testing finger-stick point-of-care INR on Monday 6-MAR-23.     INR today: Subtherapeutic  PLAN Weekly dose was deferred at this time. To CONTINUE same regimen of 10mg  for two days, 7.5mg  for 3 days, then 5mg  warfarin for remaining two days of the week before her next patient self testing, point of care, finger-stick INR on Monday 6-MAR-23.  Patient Instructions  Patient instructed to take medications as defined in the Anti-coagulation Track section of this encounter.  Patient instructed to take today's dose.  Patient instructed to take two of your 5mg  peach-colored warfarin tablets on Monday and Tuesday. On Wednesday, Thursday and Friday--take 1&1/2 tablets. On Saturday and Sunday, take only one (1) tablet. Patient will perform patient self testing finger-stick point-of-care INR on Monday 6-MAR-23. Patient verbalized understanding of these instructions.   Patient advised to contact clinic or seek medical attention if  signs/symptoms of bleeding or thromboembolism occur.  Patient verbalized understanding by repeating back information and was advised to contact me if further medication-related questions arise. Patient was also provided an information handout.  Follow-up Return in 1 week (on 08/25/2021).  Pennie Banter, PharmD, CPP  15 minutes spent face-to-face with the patient during the encounter. 50% of time spent on education, including signs/sx bleeding and clotting, as well as food and drug interactions with warfarin. 50% of time was spent on fingerprick POC INR sample collection,processing, results determination, and documentation in [email or web address]

## 2021-08-18 NOTE — Patient Instructions (Signed)
Patient instructed to take medications as defined in the Anti-coagulation Track section of this encounter.  Patient instructed to take today's dose.  Patient instructed to take two of your 5mg  peach-colored warfarin tablets on Monday and Tuesday. On Wednesday, Thursday and Friday--take 1&1/2 tablets. On Saturday and Sunday, take only one (1) tablet. Patient will perform patient self testing finger-stick point-of-care INR on Monday 6-MAR-23. Patient verbalized understanding of these instructions.

## 2021-08-19 NOTE — Progress Notes (Signed)
INTERNAL MEDICINE TEACHING ATTENDING ADDENDUM - Jnyah Brazee M.D  Duration- indefinite, Indication- mechanical mitral valve, INR- sub therapeutic. Agree with pharmacy recommendations as outlined in their note.

## 2021-08-25 ENCOUNTER — Other Ambulatory Visit: Payer: Self-pay | Admitting: Internal Medicine

## 2021-08-25 DIAGNOSIS — K219 Gastro-esophageal reflux disease without esophagitis: Secondary | ICD-10-CM

## 2021-08-26 ENCOUNTER — Telehealth: Payer: Self-pay | Admitting: Pharmacist

## 2021-08-26 NOTE — Telephone Encounter (Signed)
Received text from the patient's daughter reporting a patient-self-testing, point-of-care, finger-stick INR result of 5.7  Unfortuantely she had already taken her assigned dose ('10mg'$  warfarin) before the daughter texted. Dr. Maudie Mercury was called with this value last evening. He in-turn sent me a message within Epic this morning to which I have responded.  ? ?The patient's daughter was advised last night to have her mother drink the contents of two bottles of Lipton's Green Tea (which they had on-hand due to other hypoprothrombinemic episodes), each containing 180 micrograms of vitamin K1. The patient's daughter was advised to have her mom OMIT dose for Tuesday August 26, 2021 and to REPEAT the patient-self-testing, finger-stick, point-of-care INR on Wednesday August 27, 2021 EARLY in the morning and to report results to me to interpret and to advise subsequent recommencing of warfarin dosing based upon this INR. She repeated the information back to me correctly. There was no reporting or endorsement of any kind of bleeding.  ?

## 2021-09-02 ENCOUNTER — Telehealth: Payer: Self-pay

## 2021-09-02 MED ORDER — HYDROCODONE-ACETAMINOPHEN 5-325 MG PO TABS: 1.0000 | ORAL_TABLET | ORAL | 0 refills | Status: DC | PRN

## 2021-09-02 NOTE — Telephone Encounter (Signed)
PMP report:  ?Filled  Written  ID  Drug  QTY  Days  Prescriber  RX #  Dispenser  Refill  Daily Dose*  Pymt Type  PMP  ?08/07/2021 07/25/2021 2  ?Hydrocodone-Acetamin 5-325 Mg ?150.00 30 Me Lov [id number] [facility] ([id number]) 0/0 25.00 MME Medicare [place] ?08/06/2021 08/06/2021 2  ?Oxycodone Hcl (Ir) 5 Mg Tablet ?40.00 7 Lloyd Huger [id number] [facility] [phone]) 0/0 42.86 MME Medicare [place] ?07/10/2021 07/08/2021 2  ?Hydrocodone-Acetamin 5-325 Mg ?150.00 30 Me Lov [id number] [facility] ([id number]) 0/0 25.00 MME Medicare [place] ?

## 2021-09-03 ENCOUNTER — Telehealth: Payer: Self-pay | Admitting: *Deleted

## 2021-09-03 NOTE — Telephone Encounter (Signed)
Received on (08/26/2021) via of fax DME Standard Written Order from [facility] [place] requesting signature and return.  Given to provider to sign and return.   ? ?DME Standard Written Order signed and faxed back to [facility] [place].  Confirmation received and copy scanned into the chart.//AB/CMA ?

## 2021-09-03 NOTE — Telephone Encounter (Signed)
Received fax from [facility] with INR results from 04/30/21 to 09/02/21. Last INR 1.4 on 09/02/21. Results placed in Dr. Gladstone Pih office.  ?

## 2021-09-04 ENCOUNTER — Telehealth: Payer: Self-pay | Admitting: Pharmacist

## 2021-09-04 NOTE — Telephone Encounter (Signed)
INR reported by [employer] via fax on 14-MAR-23 at 21:51h. Received into [facility] on 15-MAR-23 and I was made aware that day but was at [facility].INR on 14-MAR-23 was 1.4, this--after having two previous high INRs requiring omission of dosing and recommendation for Lipton's Green Tea (119mg of vitamin K1). Will resume today with 1&1/2 x '5mg'$  on Th/Sa/Su/Tu; 1x'5mg'$  all other days. Repeat INR 23-MAR-23.  ?

## 2021-09-13 ENCOUNTER — Telehealth: Payer: Self-pay | Admitting: Pharmacist

## 2021-09-13 DIAGNOSIS — Z7901 Long term (current) use of anticoagulants: Secondary | ICD-10-CM | POA: Diagnosis not present

## 2021-09-13 DIAGNOSIS — Z952 Presence of prosthetic heart valve: Secondary | ICD-10-CM | POA: Diagnosis not present

## 2021-09-13 DIAGNOSIS — I2782 Chronic pulmonary embolism: Secondary | ICD-10-CM | POA: Diagnosis not present

## 2021-09-13 NOTE — Telephone Encounter (Signed)
Was provided the patient's INR by her daughter, [name]. PST FS POC INR reported just now - 2.5 (target 2.5 - 3.5) on '45mg'$  warfarin/wk. Will INCREASE to '50mg'$  warfarin/wk (1&1/2 x '5mg'$  [7.'5mg'$ ] daily EXCEPT on Fridays, only one (1x'5mg'$ ) on Fridays.INR 3APR23. ?

## 2021-09-22 NOTE — Telephone Encounter (Signed)
Perfect, thank you

## 2021-09-26 ENCOUNTER — Telehealth: Payer: Self-pay | Admitting: Pharmacist

## 2021-09-26 NOTE — Telephone Encounter (Signed)
Was provided results of PST FS POC INR determination performed TODAY (was supposed to have been performed on 3-APR-23). INR = 5.7 on '50mg'$  warfarin/wk. I discussed with the daughter, Kim Anthony had I learned her INR on 3-APR-23 as was instructed, an opportunity to have intervened and reduced the dose on that date was missed. We discussed the possibility that her mom may not be taking her warfarin based upon the instructions I am providing and I suggested the use of a seven-day pill box in which she [facility]) would fill once weekly to hopefully assure that the correct daily dose was being provided/taken. Kim Anthony cited that when she returns home on scheduled INR days after work--that she often finds her mother asleep and does not awaken her for the INR determination on the day(s)/date(s) I have indicated. Kim Anthony reports no bleeding observed. I have instructed her to OMIT TODAY's DOSE of warfarin. Take only one (1) of the '5mg'$  peach-colored warfarin tablets on Saturday and Sunday. She is to repeat the INR on Monday 10-APR-23 and not give any dose of warfarin that day until she provides me the INR. Dr. Laural Golden called me (having been paged by the device manufacturer as per protocol, reporting this INR to the physician on call as well.) ?

## 2021-09-29 ENCOUNTER — Other Ambulatory Visit: Payer: Self-pay | Admitting: Physical Medicine and Rehabilitation

## 2021-09-30 ENCOUNTER — Telehealth: Payer: Self-pay | Admitting: Pharmacist

## 2021-09-30 NOTE — Telephone Encounter (Signed)
Daughter has provided results of PST FS POC INR today = 4.1, after a HELD dose on Friday 7-APR-23 and a lowered dose to '5mg'$  on Saturday/Sunday, 8/9-APR-23. Was supposed to have done an INR on Monday 10-APR-23 but did not. Will have daughter provide the patient with '5mg'$  dose today and tomorrow and repeat INR on Thursday, October 02, 2021. With the kinetics of the drug--weighed against the kinetics (hepatic synthesis of vitamin K dependent clotting factors) I am wanting to avoid too many days of "omitted" warfarin. Will evaluate results on Thursday. No bleeding reported per the daughter.  ?

## 2021-10-01 NOTE — Telephone Encounter (Signed)
I agree

## 2021-10-02 ENCOUNTER — Other Ambulatory Visit: Payer: Self-pay | Admitting: Physical Medicine and Rehabilitation

## 2021-10-02 ENCOUNTER — Telehealth: Payer: Self-pay | Admitting: *Deleted

## 2021-10-02 ENCOUNTER — Telehealth: Payer: Self-pay | Admitting: Pharmacist

## 2021-10-02 MED ORDER — HYDROCODONE-ACETAMINOPHEN 5-325 MG PO TABS
1.0000 | ORAL_TABLET | ORAL | 0 refills | Status: DC | PRN
Start: 2021-10-02 — End: 2021-10-31

## 2021-10-02 NOTE — Telephone Encounter (Signed)
Kim Anthony called for refill on her hydrocodone. Her last fill date was 09/06/21 and is due to be out this weekend. She will need it by tomorrow as she is going out of town.  ?

## 2021-10-02 NOTE — Telephone Encounter (Signed)
Daughter reports results of PST FS POC INR = 3.0 (target goal 2.5 - 3.5). Will recommend 1&1/2 x '5mg'$  (7.'5mg'$ ) on Mon/Thur; 1x'5mg'$  all other days. Repeat INR 24APR23. ?

## 2021-10-03 ENCOUNTER — Telehealth: Payer: Self-pay | Admitting: Pharmacist

## 2021-10-03 NOTE — Telephone Encounter (Signed)
See advice provided to the patient's daughter last night when she called me at home. ?

## 2021-10-03 NOTE — Telephone Encounter (Signed)
Notified refill sent in. ?

## 2021-10-06 ENCOUNTER — Other Ambulatory Visit (HOSPITAL_COMMUNITY): Payer: Self-pay | Admitting: Internal Medicine

## 2021-10-06 ENCOUNTER — Other Ambulatory Visit: Payer: Self-pay | Admitting: Internal Medicine

## 2021-10-06 DIAGNOSIS — K219 Gastro-esophageal reflux disease without esophagitis: Secondary | ICD-10-CM

## 2021-10-06 DIAGNOSIS — I5042 Chronic combined systolic (congestive) and diastolic (congestive) heart failure: Secondary | ICD-10-CM

## 2021-10-06 NOTE — Telephone Encounter (Signed)
I agree. Thank you Dr. Elie Confer.

## 2021-10-13 ENCOUNTER — Other Ambulatory Visit: Payer: Self-pay | Admitting: Internal Medicine

## 2021-10-13 DIAGNOSIS — J449 Chronic obstructive pulmonary disease, unspecified: Secondary | ICD-10-CM

## 2021-10-13 DIAGNOSIS — M25562 Pain in left knee: Secondary | ICD-10-CM | POA: Diagnosis not present

## 2021-10-14 DIAGNOSIS — I2782 Chronic pulmonary embolism: Secondary | ICD-10-CM | POA: Diagnosis not present

## 2021-10-14 DIAGNOSIS — Z7901 Long term (current) use of anticoagulants: Secondary | ICD-10-CM | POA: Diagnosis not present

## 2021-10-14 DIAGNOSIS — Z952 Presence of prosthetic heart valve: Secondary | ICD-10-CM | POA: Diagnosis not present

## 2021-10-15 ENCOUNTER — Telehealth: Payer: Self-pay | Admitting: Pharmacist

## 2021-10-15 NOTE — Telephone Encounter (Signed)
Was called last evening (10/14/21 @ 2046h), daughter reporting her mom's (the patient) INR = 2.0 on '40mg'$  warfarin/wk. INCREASED to 1&1/2 x '5mg'$  (7.'5mg'$ ) on Tu/Th/Sa; '5mg'$  all other days. Repeat INR 8MAY23. ?

## 2021-10-22 ENCOUNTER — Other Ambulatory Visit: Payer: Self-pay | Admitting: Internal Medicine

## 2021-10-22 DIAGNOSIS — J449 Chronic obstructive pulmonary disease, unspecified: Secondary | ICD-10-CM

## 2021-10-31 ENCOUNTER — Encounter: Payer: Self-pay | Admitting: *Deleted

## 2021-10-31 ENCOUNTER — Telehealth: Payer: Self-pay | Admitting: *Deleted

## 2021-10-31 MED ORDER — HYDROCODONE-ACETAMINOPHEN 5-325 MG PO TABS: 1.0000 | ORAL_TABLET | ORAL | 0 refills | Status: DC | PRN

## 2021-10-31 NOTE — Telephone Encounter (Signed)
Kim Anthony is asking for a refill on her hydrocodone. Last fill date was 10/03/21. ?

## 2021-11-04 ENCOUNTER — Other Ambulatory Visit: Payer: Self-pay

## 2021-11-04 ENCOUNTER — Encounter: Payer: Self-pay | Admitting: Internal Medicine

## 2021-11-04 ENCOUNTER — Ambulatory Visit (HOSPITAL_COMMUNITY)
Admission: RE | Admit: 2021-11-04 | Discharge: 2021-11-04 | Disposition: A | Discharge status: Home / Self Care | Payer: Medicare Other | Source: Ambulatory Visit | Attending: Internal Medicine | Admitting: Internal Medicine

## 2021-11-04 ENCOUNTER — Ambulatory Visit (INDEPENDENT_AMBULATORY_CARE_PROVIDER_SITE_OTHER): Payer: Medicare Other | Admitting: Internal Medicine

## 2021-11-04 VITALS — BP 125/74 | HR 86 | Temp 98.3°F | Ht 65.0 in | Wt 173.1 lb

## 2021-11-04 DIAGNOSIS — J441 Chronic obstructive pulmonary disease with (acute) exacerbation: Secondary | ICD-10-CM | POA: Diagnosis not present

## 2021-11-04 DIAGNOSIS — F17210 Nicotine dependence, cigarettes, uncomplicated: Secondary | ICD-10-CM | POA: Diagnosis not present

## 2021-11-04 DIAGNOSIS — J449 Chronic obstructive pulmonary disease, unspecified: Secondary | ICD-10-CM

## 2021-11-04 DIAGNOSIS — J811 Chronic pulmonary edema: Secondary | ICD-10-CM | POA: Diagnosis not present

## 2021-11-04 MED ORDER — ALBUTEROL SULFATE (2.5 MG/3ML) 0.083% IN NEBU: INHALATION_SOLUTION | RESPIRATORY_TRACT | 2 refills | Status: DC

## 2021-11-04 MED ORDER — PREDNISONE 20 MG PO TABS: 40.0000 mg | ORAL_TABLET | Freq: Every day | ORAL | 0 refills | Status: AC

## 2021-11-04 NOTE — Patient Instructions (Signed)
It was nice seeing you today! Thank you for choosing [facility] for your Primary Care.  ?  ?Today we talked about:  ? ?COPD Exacerbation:  ?I have sent in a prescription for Prednisone. Please take 2 tablets daily for 5 days.  ?Albuterol nebilizer has been refilled.  ?I have ordered a chest xray. This can be done today if you would like. Head up to the Radiology Department on the 1st floor to have this done. I will call you with the results.  ?Unless your xray shows pneumonia, we will hold off on antibiotics to avoid interference with Warfarin.  ?

## 2021-11-05 ENCOUNTER — Telehealth: Payer: Self-pay

## 2021-11-05 ENCOUNTER — Encounter: Payer: Medicare Other | Admitting: Internal Medicine

## 2021-11-05 DIAGNOSIS — J441 Chronic obstructive pulmonary disease with (acute) exacerbation: Secondary | ICD-10-CM | POA: Insufficient documentation

## 2021-11-05 MED ORDER — LEVOFLOXACIN 750 MG PO TABS: 750.0000 mg | ORAL_TABLET | Freq: Every day | ORAL | 0 refills | Status: AC

## 2021-11-05 NOTE — Progress Notes (Signed)
? ?  CC: Cough with wheezing ? ?HPI: ? ?[name] is a 71 y.o. with a PMHx as listed below who presents to the clinic for cough with wheezing.  ? ?Please see the Encounters tab for problem-based Assessment & Plan regarding status of patient's acute and chronic conditions. ? ?Past Medical History:  ?Diagnosis Date  ? Arthritis   ? Asthma   ? Breast cancer ([place]) 2001  ? Left Breast Cancer  ? CHF (congestive heart failure) ([place])   ? COPD (chronic obstructive pulmonary disease) ([place])   ? Coronary artery disease   ? GERD (gastroesophageal reflux disease)   ? History of mitral valve replacement with mechanical valve   ? HLD (hyperlipidemia)   ? Hypertension   ? Pulmonary embolism ([place]) 2021  ? ?Review of Systems: Review of Systems  ?Constitutional:  Negative for chills and fever.  ?HENT:  Positive for congestion (Resolved).   ?Respiratory:  Positive for cough, sputum production, shortness of breath and wheezing.   ?Cardiovascular:  Negative for chest pain, palpitations and leg swelling.  ?Gastrointestinal:  Negative for abdominal pain, diarrhea, nausea and vomiting.  ?Neurological:  Negative for dizziness, focal weakness, weakness and headaches.  ? ?Physical Exam: ? ?Vitals:  ? 11/04/21 1456  ?BP: 125/74  ?Pulse: 86  ?Temp: 98.3 ?F (36.8 ?C)  ?TempSrc: Oral  ?SpO2: 94%  ?Weight: 173 lb 1.6 oz (78.5 kg)  ?Height: '5\' 5"'$  (1.651 m)  ? ?Physical Exam ?Vitals and nursing note reviewed.  ?Constitutional:   ?   General: She is not in acute distress. ?   Appearance: She is normal weight.  ?HENT:  ?   Head: Normocephalic and atraumatic.  ?   Nose: Nose normal.  ?   Mouth/Throat:  ?   Mouth: Mucous membranes are moist.  ?   Pharynx: Oropharynx is clear.  ?Cardiovascular:  ?   Rate and Rhythm: Normal rate and regular rhythm.  ?Pulmonary:  ?   Effort: Pulmonary effort is normal. No respiratory distress.  ?   Breath sounds: No wheezing or rales.  ?   Comments: Decreased breath sounds involving the right lower lung field and left  upper lung field.  No wheezing, Rales or rhonchi ?Abdominal:  ?   General: Bowel sounds are normal.  ?   Palpations: Abdomen is soft.  ?Musculoskeletal:  ?   Right lower leg: No edema.  ?   Left lower leg: No edema.  ?Skin: ?   General: Skin is warm and dry.  ?Neurological:  ?   General: No focal deficit present.  ?   Mental Status: She is alert and oriented to person, place, and time. Mental status is at baseline.  ?Psychiatric:     ?   Mood and Affect: Mood normal.     ?   Behavior: Behavior normal.  ? ? ?Assessment & Plan:  ? ?See Encounters Tab for problem based charting. ? ?Patient discussed with Dr. Evette Doffing ? ?

## 2021-11-05 NOTE — Assessment & Plan Note (Addendum)
Kim Anthony states that several members of her home has been sick in the past couple weeks.  She initially was experiencing rhinorrhea and congestion but that resolved on its own.  She subsequently developed cough with increased sputum production, increased wheezing and has been requiring her albuterol nebulizer more frequently.  She endorses shortness of breath but states this is relatively unchanged compared to her baseline.  Her cough and wheezing began approximately 5 days ago.  She denies any fever, chills, nausea, vomiting, diarrhea, abdominal pain, chest pain, palpitations.  Assessment/plan: On examination, there are decreased breath sounds in the right lower lung field and left upper lung field, but otherwise no wheezing, rhonchi or rales noted.  She is saturating well on room air.  History consistent with COPD exacerbation.  Given others have been sick within the household, I suspect a viral etiology.  Due to this, and her ongoing warfarin use, will hold off on starting azithromycin.  We will start with prednisone and obtain a chest x-ray.  - Start prednisone 40 mg daily for 5 days - Albuterol nebulizer treatments refilled.  Counseled to use every 4-6 hours as needed - Chest x-ray pending - Follow-up in 1 week if no improvement.  Recommended she go to the ER or schedule an urgent follow-up should she develop worsening shortness of breath, chest pain or respiratory distress  ADDENDUM: Chest x-ray with abnormal findings; concern for lobar pneumonia involving the right middle lobe. Due to this, will start a 5 day course of Levofloxacin for both typical and atypical coverage. Counseled Kim Anthony to check her INR today and every other day while on Levofloxacin. Will reach out to Dr. Elie Confer to inform him as well.   ADDENDUM:  Radiology reading of CXR not consistent with lobar pneumonia. Contacted patient again and asked her to hold off on starting the antibiotics. Kim Anthony notes she continues to have  severe wheezing and is using her nebulizer treatments ever 4 hours. Discussed monitoring closely for development of fevers or worsening SOB, at which time antibiotics may be warranted. If she develops SOB though, recommended she go to the ED. She expressed understanding.

## 2021-11-05 NOTE — Addendum Note (Signed)
Addended by: Jose Persia on: 11/05/2021 05:07 PM ? ? Modules accepted: Orders ? ?

## 2021-11-05 NOTE — Telephone Encounter (Signed)
Patient called regarding lab results 

## 2021-11-06 NOTE — Progress Notes (Signed)
Internal Medicine Clinic Attending  Case discussed with Dr. Basaraba  At the time of the visit.  We reviewed the resident's history and exam and pertinent patient test results.  I agree with the assessment, diagnosis, and plan of care documented in the resident's note.  

## 2021-11-10 DIAGNOSIS — Z7901 Long term (current) use of anticoagulants: Secondary | ICD-10-CM | POA: Diagnosis not present

## 2021-11-10 DIAGNOSIS — I2782 Chronic pulmonary embolism: Secondary | ICD-10-CM | POA: Diagnosis not present

## 2021-11-10 DIAGNOSIS — Z952 Presence of prosthetic heart valve: Secondary | ICD-10-CM | POA: Diagnosis not present

## 2021-11-12 ENCOUNTER — Telehealth: Payer: Self-pay | Admitting: *Deleted

## 2021-11-12 NOTE — Telephone Encounter (Signed)
Call from patient requesting refill on her Prednisone.  Has had some wheezing and has used her inhaler.  Stated Prednisone helps to relieve this.  No appointments available this afternoon.

## 2021-11-12 NOTE — Telephone Encounter (Signed)
Would want to re-evaluate her before doing any more prednisone. Can we give her an appointment for tomorrow? If she can't wait that long, she may want to access an urgent care.

## 2021-11-12 NOTE — Telephone Encounter (Signed)
RTC to patient x 2 .  Informed that she will need to be seen prior to refill.  Given an appointment for 11/13/2021 at 8:45 AM.

## 2021-11-13 ENCOUNTER — Telehealth: Payer: Medicare Other

## 2021-11-13 ENCOUNTER — Other Ambulatory Visit: Payer: Self-pay | Admitting: Internal Medicine

## 2021-11-13 ENCOUNTER — Ambulatory Visit (INDEPENDENT_AMBULATORY_CARE_PROVIDER_SITE_OTHER): Payer: Medicare Other | Admitting: Internal Medicine

## 2021-11-13 ENCOUNTER — Encounter: Payer: Self-pay | Admitting: Internal Medicine

## 2021-11-13 ENCOUNTER — Ambulatory Visit (HOSPITAL_COMMUNITY)
Admission: RE | Admit: 2021-11-13 | Discharge: 2021-11-13 | Disposition: A | Discharge status: Home / Self Care | Payer: Medicare Other | Source: Ambulatory Visit | Attending: Internal Medicine | Admitting: Internal Medicine

## 2021-11-13 ENCOUNTER — Other Ambulatory Visit: Payer: Self-pay | Admitting: *Deleted

## 2021-11-13 ENCOUNTER — Other Ambulatory Visit: Payer: Self-pay

## 2021-11-13 VITALS — BP 127/76 | HR 77 | Temp 98.3°F | Ht 65.0 in | Wt 176.1 lb

## 2021-11-13 DIAGNOSIS — R062 Wheezing: Secondary | ICD-10-CM | POA: Diagnosis not present

## 2021-11-13 DIAGNOSIS — M25561 Pain in right knee: Secondary | ICD-10-CM | POA: Diagnosis not present

## 2021-11-13 DIAGNOSIS — F17210 Nicotine dependence, cigarettes, uncomplicated: Secondary | ICD-10-CM | POA: Diagnosis not present

## 2021-11-13 DIAGNOSIS — M25562 Pain in left knee: Secondary | ICD-10-CM | POA: Diagnosis not present

## 2021-11-13 DIAGNOSIS — J449 Chronic obstructive pulmonary disease, unspecified: Secondary | ICD-10-CM

## 2021-11-13 DIAGNOSIS — J441 Chronic obstructive pulmonary disease with (acute) exacerbation: Secondary | ICD-10-CM | POA: Diagnosis not present

## 2021-11-13 MED ORDER — AZITHROMYCIN 250 MG PO TABS: ORAL_TABLET | ORAL | 0 refills | Status: AC

## 2021-11-13 MED ORDER — PREDNISONE 20 MG PO TABS: 40.0000 mg | ORAL_TABLET | Freq: Every day | ORAL | 0 refills | Status: AC

## 2021-11-13 NOTE — Progress Notes (Signed)
   CC: COPD Exacerbation Follow Up  HPI:  [name] is a 71 y.o. person, with a PMH noted below, who presents to the clinic COPD Exacerbation Follow Up. To see the management of their acute and chronic conditions, please see the A&P note under the Encounters tab.   Past Medical History:  Diagnosis Date   Arthritis    Asthma    Breast cancer ([place]) 2001   Left Breast Cancer   CHF (congestive heart failure) ([facility])    COPD (chronic obstructive pulmonary disease) ([facility])    Coronary artery disease    GERD (gastroesophageal reflux disease)    History of mitral valve replacement with mechanical valve    HLD (hyperlipidemia)    Hypertension    Pulmonary embolism ([place]) 2021   Review of Systems:   Review of Systems  Constitutional:  Negative for chills, diaphoresis, fever, malaise/fatigue and weight loss.  Respiratory:  Positive for cough, sputum production, shortness of breath and wheezing. Negative for hemoptysis.   Gastrointestinal:  Negative for abdominal pain, diarrhea, nausea and vomiting.    Physical Exam:  Vitals:   11/13/21 0909  BP: 127/76  Pulse: 77  Temp: 98.3 F (36.8 C)  TempSrc: Oral  SpO2: 99%  Weight: 176 lb 1.6 oz (79.9 kg)  Height: '5\' 5"'$  (1.651 m)   Physical Exam Constitutional:      Appearance: Normal appearance.     Comments: Sitting comfortably in chair, coughing throughout interview, NAD  Cardiovascular:     Rate and Rhythm: Normal rate and regular rhythm.     Pulses: Normal pulses.     Heart sounds: Normal heart sounds. No murmur heard.   No friction rub. No gallop.  Pulmonary:     Effort: Pulmonary effort is normal. No respiratory distress.     Breath sounds: No stridor. Wheezing present. No rhonchi or rales.     Comments: Faint wheezing appreciated at the left mid lung field Chest:     Chest wall: No tenderness.  Neurological:     Mental Status: She is alert and oriented to person, place, and time.  Psychiatric:        Mood and Affect:  Mood normal.        Behavior: Behavior normal.     Assessment & Plan:   See Encounters Tab for problem based charting.  Patient discussed with Dr. Dareen Piano  COPD exacerbation [facility]) Patient presents for follow up on her COPD exacerbation. She states that she is still having wheezing episodes that are only occurring on the left side. She denies any fevers, nausea, vomiting, dyspnea. She does continue to endorse frequent use of her inhalers, and feels jittery after using them. She does endorse continued sputum production and cough.   A/P:  Patient presents with continued symptoms after 5 days of prednisone therapy for her COPD exacerbation. On last visit, imaging was obtained that showed no signs for pneumonia. Today on examination there is isolated wheezing appreciated at the mid left lung field. We will re-obtain imaging for this finding.  - CXR - Prednisone 40 mg daily x5 days - Z-pak

## 2021-11-13 NOTE — Patient Instructions (Addendum)
To Ms. Mannes,   It was a pleasure seeing you today! Today we discussed your continued COPD exacerbation.   For your exacerbation, since I am hearing faint wheezing on the left side, I would like to get additional imaging today to ensure there is no new pneumonia. I am going to prescribe a course of prednisone for 5 days as well as an antibiotic called azithromycin. Additionally, I would like you to pick up a medication over the counter called guaifenesin which should help break up the mucus. We will see you back in a week, additionally, I would like for you to see Dr. Elie Confer in a week as well to assess your warfarin levels. If you begin to have increased shortness of breath, fevers, or chest pain, please go to the nearest ED or urgent care for further evaluation.   We will see you in 1 week,  Maudie Mercury, MD

## 2021-11-14 MED ORDER — AMOXICILLIN-POT CLAVULANATE 500-125 MG PO TABS: 500.0000 mg | ORAL_TABLET | Freq: Three times a day (TID) | ORAL | 0 refills | Status: AC

## 2021-11-14 NOTE — Assessment & Plan Note (Addendum)
Patient presents for follow up on her COPD exacerbation. She states that she is still having wheezing episodes that are only occurring on the left side. She denies any fevers, nausea, vomiting, dyspnea. She does continue to endorse frequent use of her inhalers, and feels jittery after using them. She does endorse continued sputum production and cough.   A/P:  Patient presents with continued symptoms after 5 days of prednisone therapy for her COPD exacerbation. On last visit, imaging was obtained that showed no signs for pneumonia. Today on examination there is isolated wheezing appreciated at the mid left lung field. We will re-obtain imaging for this finding.  - CXR - Prednisone 40 mg daily x5 days - Z-pak, will escalate pending CXR results  Addendum:  Left consolidation noted at the apex. Will treat with Augmentin for 5 days. Patient called with results. - Augmentin 500 mg TID for 5 days - One week follow up

## 2021-11-18 ENCOUNTER — Other Ambulatory Visit (HOSPITAL_COMMUNITY): Payer: Self-pay | Admitting: Internal Medicine

## 2021-11-18 DIAGNOSIS — I5042 Chronic combined systolic (congestive) and diastolic (congestive) heart failure: Secondary | ICD-10-CM

## 2021-11-19 ENCOUNTER — Other Ambulatory Visit (HOSPITAL_COMMUNITY): Payer: Self-pay | Admitting: Family Medicine

## 2021-11-19 NOTE — Progress Notes (Addendum)
   CC: Shortness of breath  HPI:  [name] is a 71 y.o. with medical history as below presenting to [facility] for follow up for community acquired pneumonia.   Please see problem-based list for further details, assessments, and plans.  Past Medical History:  Diagnosis Date   Arthritis    Asthma    Breast cancer ([place]) 2001   Left Breast Cancer   CHF (congestive heart failure) ([facility])    COPD (chronic obstructive pulmonary disease) ([facility])    Coronary artery disease    GERD (gastroesophageal reflux disease)    History of mitral valve replacement with mechanical valve    HLD (hyperlipidemia)    Hypertension    Pulmonary embolism ([place]) 2021   Review of Systems:  Review of system negative unless stated in the problem list or HPI.    Physical Exam:  Vitals:   11/20/21 0843  BP: 130/72  Pulse: 81  Temp: 97.8 F (36.6 C)  TempSrc: Oral  SpO2: 95%  Weight: 177 lb 1.6 oz (80.3 kg)  Height: '5\' 5"'$  (1.651 m)   Physical Exam General: Well-developed, NAD Head: Normocephalic without scalp lesions.  Eyes: PERRLA, Conjunctivae pink, sclerae white, without icterus.  Mouth and Throat: Lips normal color, without lesions. Moist mucus membrane. Neck: Neck supple with full range of motion (ROM).  Lungs: CTAB, no wheeze, rhonchi or rales.  Cardiovascular: Normal heart sounds, systolic murmur present, no r/m/g. No LE edema Abdomen: No TTP, normal bowel sounds MSK: No asymmetry or muscle atrophy.  Skin: warm, dry good skin turgor, no lesions noted Neuro: Alert and oriented. CN grossly intact Psych: Normal mood and normal affect    Assessment & Plan:   See Encounters Tab for problem based charting.  Patient seen with Dr. Oren Binet, MD

## 2021-11-19 NOTE — Progress Notes (Signed)
Internal Medicine Clinic Attending ? ?Case discussed with Dr. Winters  At the time of the visit.  We reviewed the resident?s history and exam and pertinent patient test results.  I agree with the assessment, diagnosis, and plan of care documented in the resident?s note.  ?

## 2021-11-20 ENCOUNTER — Other Ambulatory Visit: Payer: Self-pay

## 2021-11-20 ENCOUNTER — Ambulatory Visit: Payer: Medicare Other

## 2021-11-20 ENCOUNTER — Ambulatory Visit (INDEPENDENT_AMBULATORY_CARE_PROVIDER_SITE_OTHER): Payer: Medicare Other | Admitting: Internal Medicine

## 2021-11-20 VITALS — BP 130/72 | HR 81 | Temp 97.8°F | Ht 65.0 in | Wt 177.1 lb

## 2021-11-20 DIAGNOSIS — F17210 Nicotine dependence, cigarettes, uncomplicated: Secondary | ICD-10-CM | POA: Diagnosis not present

## 2021-11-20 DIAGNOSIS — J440 Chronic obstructive pulmonary disease with acute lower respiratory infection: Secondary | ICD-10-CM | POA: Diagnosis not present

## 2021-11-20 DIAGNOSIS — J441 Chronic obstructive pulmonary disease with (acute) exacerbation: Secondary | ICD-10-CM

## 2021-11-20 DIAGNOSIS — J189 Pneumonia, unspecified organism: Secondary | ICD-10-CM | POA: Diagnosis not present

## 2021-11-20 MED ORDER — CEFDINIR 300 MG PO CAPS
300.0000 mg | ORAL_CAPSULE | Freq: Two times a day (BID) | ORAL | 0 refills | Status: AC
Start: 2021-11-20 — End: 2021-11-23

## 2021-11-20 NOTE — Patient Instructions (Signed)
Visit Information  Thank you for taking time to visit with me today. Please don't hesitate to contact me if I can be of assistance to you before our next scheduled telephone appointment.  Our next appointment is by telephone on 11/27/21 at 2:30.  Please call the care guide team at [phone] if you need to cancel or reschedule your appointment.   If you are experiencing a Mental Health or [place] or need someone to talk to, please call the [place] National Suicide Prevention Lifeline: 616-843-2118 or TTY: 631-692-7321 TTY (925)058-2349) to talk to a trained counselor   Patient verbalizes understanding of instructions and care plan provided today and agrees to view in [place]. Active MyChart status and patient understanding of how to access instructions and care plan via MyChart confirmed with patient.     [name], RN, BSN, CCM Care Management Coordinator [facility] Phone: [phone]: [phone]

## 2021-11-20 NOTE — Patient Instructions (Signed)
Kim Anthony, it was a pleasure seeing you today! You endorsed feeling well today. Below are some of the things we talked about this visit. We look forward to seeing you in the follow up appointment!  Today we discussed: It was a pleasure taking care of you!  You presented with follow-up for your pneumonia.  You had stopped antibiotics early as they were causing you GI discomfort including abdominal pain and diarrhea.  You endorsed feeling better than before but not completely back to your old self.  You said abdominal pain has gotten better but the diarrhea is still present.  We would like you to complete the antibiotic course but due to the side effects we are switching her antibiotics.  We will send you with 3 days of new antibiotic.  Please pick this up from the pharmacy and finish these.  For your coughing that has improved, you can use Mucinex which should help clear your coughing up.  I would like you to return if you have symptoms of shortness of breath, coughing, fevers or chills next week.  At that time we can perform repeat chest x-ray.  If your symptoms get severely worse please go to the emergency department.  I have ordered the following labs today:  Lab Orders  No laboratory test(s) ordered today      Referrals ordered today:   Referral Orders  No referral(s) requested today     I have ordered the following medication/changed the following medications:   Stop the following medications: There are no discontinued medications.   Start the following medications: Meds ordered this encounter  Medications   cefdinir (OMNICEF) 300 MG capsule    Sig: Take 1 capsule (300 mg total) by mouth 2 (two) times daily for 3 days.    Dispense:  6 capsule    Refill:  0     Follow-up: 1 week if symptoms still present  Please make sure to arrive 15 minutes prior to your next appointment. If you arrive late, you may be asked to reschedule.   We look forward to seeing you next time.  Please call our clinic at [phone] if you have any questions or concerns. The best time to call is Monday-Friday from 9am-4pm, but there is someone available 24/7. If after hours or the weekend, call the main hospital number and ask for the Internal Medicine Resident On-Call. If you need medication refills, please notify your pharmacy one week in advance and they will send [place] a request.  Thank you for letting us take part in your care. Wishing you the best!  Thank you, Idamae Schuller, MD

## 2021-11-20 NOTE — Chronic Care Management (AMB) (Signed)
Care Management    RN Visit Note  11/20/2021 Name: Kim Anthony [name]: [id number] DOB: [birth year]  Subjective: Kim Anthony is a 71 y.o. year old female who is a primary care patient of Marianna Payment, MD. The care management team was consulted for assistance with disease management and care coordination needs.    Engaged with patient by telephone for follow up visit in response to provider referral for case management and/or care coordination services.   Consent to Services:   [name] was given information about Care Management services today including:  Care Management services includes personalized support from designated clinical staff supervised by her physician, including individualized plan of care and coordination with other care providers 24/7 contact phone numbers for assistance for urgent and routine care needs. The patient may stop case management services at any time by phone call to the office staff.  Patient agreed to services and consent obtained.   Assessment: Review of patient past medical history, allergies, medications, health status, including review of consultants reports, laboratory and other test data, was performed as part of comprehensive evaluation and provision of chronic care management services.   SDOH (Social Determinants of Health) assessments and interventions performed:    Care Plan  Allergies  Allergen Reactions   Lisinopril Swelling   Acetaminophen-Codeine Itching   Propoxyphene Itching    Darvocet   Tape Rash    Plastic   Tramadol Itching, Nausea And Vomiting and Rash    Outpatient Encounter Medications as of 11/20/2021  Medication Sig   albuterol (PROVENTIL) (2.5 MG/3ML) 0.083% nebulizer solution USE 1 VIAL IN NEBULIZER EVERY 6 HOURS AS NEEDED FOR WHEEZING FOR SHORTNESS OF BREATH   albuterol (VENTOLIN HFA) 108 (90 Base) MCG/ACT inhaler INHALE 2 PUFFS BY MOUTH EVERY 6 HOURS AS NEEDED FOR WHEEZING FOR SHORTNESS OF BREATH   cefdinir (OMNICEF)  300 MG capsule Take 1 capsule (300 mg total) by mouth 2 (two) times daily for 3 days.   dapagliflozin propanediol (FARXIGA) 10 MG TABS tablet Take 1 tablet (10 mg total) by mouth daily.   diphenhydrAMINE (BENADRYL) 25 MG tablet Take 75 mg by mouth daily as needed for allergies. (Patient not taking: Reported on 08/11/2021)   docusate sodium (COLACE) 100 MG capsule Take 1 capsule (100 mg total) by mouth 2 (two) times daily as needed for mild constipation.   enoxaparin (LOVENOX) 80 MG/0.8ML injection Inject 0.8 mLs (80 mg total) into the skin every 12 (twelve) hours. Start 24 hours after your surgery. (Patient not taking: Reported on 08/18/2021)   famotidine (PEPCID) 20 MG tablet Take 1 tablet by mouth twice daily   fluticasone (FLONASE) 50 MCG/ACT nasal spray Place 1 spray into both nostrils daily as needed for allergies. (Patient taking differently: Place 1 spray into both nostrils daily.)   furosemide (LASIX) 40 MG tablet Take 1/2 (one-half) tablet by mouth once daily   gabapentin (NEURONTIN) 600 MG tablet TAKE 1 & 1/2 (ONE & ONE-HALF) TABLETS BY MOUTH THREE TIMES DAILY FOR  NERVE  PAIN   hydrALAZINE (APRESOLINE) 25 MG tablet Take 1 tablet (25 mg total) by mouth 3 (three) times daily.   HYDROcodone-acetaminophen (NORCO) 5-325 MG tablet Take 1 tablet by mouth every 4 (four) hours as needed for moderate pain.   isosorbide mononitrate (IMDUR) 30 MG 24 hr tablet Take 1 tablet (30 mg total) by mouth daily.   metoprolol succinate (TOPROL-XL) 25 MG 24 hr tablet Take 1 tablet (25 mg total) by mouth daily.   montelukast (  SINGULAIR) 10 MG tablet Take 1 tablet (10 mg total) by mouth at bedtime.   nicotine (NICODERM CQ - DOSED IN MG/24 HR) 7 mg/24hr patch Place 1 patch (7 mg total) onto the skin daily. (Patient not taking: Reported on 07/28/2021)   polyethylene glycol (MIRALAX / GLYCOLAX) 17 g packet Take 17 g by mouth daily.   potassium chloride SA (KLOR-CON M) 20 MEQ tablet Take 2 tablets (40 mEq total) by mouth  daily.   simvastatin (ZOCOR) 20 MG tablet Take 1 tablet (20 mg total) by mouth daily.   Spacer/Aero-Holding Chambers (BREATHERITE COLL SPACER ADULT) MISC 1 applicator by Does not apply route daily.   spironolactone (ALDACTONE) 25 MG tablet Take 1 tablet by mouth once daily   STIOLTO RESPIMAT 2.5-2.5 MCG/ACT AERS INHALE 2 PUFFS BY MOUTH ONCE DAILY   warfarin (COUMADIN) 5 MG tablet TAKE 1& 1/2  TABLETS BY MOUTH ONCE DAILY AT 4 PM.   No facility-administered encounter medications on file as of 11/20/2021.    Patient Active Problem List   Diagnosis Date Noted   COPD exacerbation ([place]) 11/05/2021   Conjunctival hemorrhage of left eye 06/25/2021   Osteoarthritis of left knee 04/24/2021   Seronegative inflammatory arthritis 02/17/2021   High risk medication use 02/17/2021   Elevated transaminase level 01/27/2021   Acute idiopathic gout of multiple sites 09/30/2020   Chronic pain syndrome 01/17/2020   Monoarthritis of wrist 10/18/2019   Ganglion cyst 09/21/2019   Hip pain 08/23/2019   Thoracic ascending aortic aneurysm ([place]) 07/05/2019   Anemia 02/16/2019   Breast cancer ([place])    Acquired absence of left breast 12/20/2018   COPD (chronic obstructive pulmonary disease) ([place]) 11/25/2017   S/P MVR (mitral valve replacement)    Chronic anticoagulation    Tobacco use disorder 02/25/2017   Lymphadenopathy, mediastinal 02/25/2017   Essential hypertension 02/01/2016   Allergic rhinitis 02/01/2016   Healthcare maintenance 01/30/2016   Chronic low back pain 07/22/2015   Pulmonary embolism ([place]) 07/11/2015   Chronic combined systolic and diastolic heart failure ([place]) 07/11/2015   Headache, common migraine 07/11/2015   HLD (hyperlipidemia) 07/11/2015    Conditions to be addressed/monitored: CHF, HTN, COPD, and Pneumonia  Care Plan : COPD (Adult)  Updates made by [name], RN since 11/20/2021 12:00 AM     Problem: Symptom Exacerbation (COPD)- call provider when symptoms not improving  despite   Priority: High     Long-Range Goal: Symptom Exacerbation Prevented or Minimized to prevent hospitalization or need for emergency treatment   Start Date: 06/20/2020  Recent Progress: On track  Priority: High  Note:   Current Barriers: Successful outreach to patient this morning.  Patient had just left [facility] after a follow up for pneumonia and she was on her way to pick up her antibiotic.  Discussed her new antibiotic (Omnicef 326m BIDx 3 days) and Mucinex she is going to purchase.  MD recommended she not get a product with dextromethorphan due to interactions with her BP. Encouraged patient since this is the 2nd antibiotic she is taking and she had to stop taking the 1st (Augmentin) due to diarrhea, she needs to contact the clinic if she has any issues with the new antibiotic. Plan- Patient agreed to follow up in one week to see how she is progressing. Knowledge Deficits related to plan of care for management of CHF, HTN, and COPD  Chronic Disease Management support and education needs related to CHF, HTN, and COPD   RNCM Clinical Goal(s):  Patient will  verbalize understanding of plan for management of CHF, HTN, and COPD as evidenced by improving health status  through collaboration with RN Care manager, provider, and care team.   Interventions: 1:1 collaboration with primary care provider regarding development and update of comprehensive plan of care as evidenced by provider attestation and co-signature Inter-disciplinary care team collaboration (see longitudinal plan of care) Evaluation of current treatment plan related to  self management and patient's adherence to plan as established by provider   Heart Failure Interventions:  (Status:  Goal on track:  Yes.) Long Term Goal Assessed need for readable accurate scales in home Advised patient to weigh each morning after emptying bladder Provided patient with education about the role of exercise in the management of heart  failure  COPD Interventions:  (Status:  Goal on track:  Yes.) Long Term Goal Provided education about and advised patient to utilize infection prevention strategies to reduce risk of respiratory infection Discussed the importance of adequate rest and management of fatigue with COPD   Hypertension Interventions:  (Status:  Goal on track:  Yes.) Long Term Goal Last practice recorded BP readings:  BP Readings from Last 3 Encounters:  04/23/21 137/82  03/24/21 113/74  03/11/21 98/69  Most recent eGFR/CrCl:  Lab Results  Component Value Date   EGFR 57 (L) 01/27/2021    No components found for: CRCL  Reviewed medications with patient and discussed importance of compliance Counseled on adverse effects of illicit drug and excessive alcohol use in patients with high blood pressure  Reviewed scheduled/upcoming provider appointments including:  Discussed the importance of smoking cessation.  Patient Goals/Self-Care Activities: Take all medications as prescribed Attend all scheduled provider appointments Call pharmacy for medication refills 3-7 days in advance of running out of medications Perform IADL's (shopping, preparing meals, housekeeping, managing finances) independently Call provider office for new concerns or questions  call office if I gain more than 2 pounds in one day or 5 pounds in one week do ankle pumps when sitting keep legs up while sitting track weight in diary use salt in moderation watch for swelling in feet, ankles and legs every day eat more whole grains, fruits and vegetables, lean meats and healthy fats eliminate smoking in my home identify and avoid work-related triggers begin a symptom diary eliminate symptom triggers at home check blood pressure 3 times per week call doctor for signs and symptoms of high blood pressure keep all doctor appointments take medications for blood pressure exactly as prescribed eat more whole grains, fruits and vegetables, lean  meats and healthy fats  Follow Up Plan:  The patient has been provided with contact information for the care management team and has been advised to call with any health related questions or concerns.   Current Barriers:  Knowledge deficits related to basic COPD self care/management-   Case Manager Clinical Goal(s): Over the next 30-60 days, patient will not be hospitalized for COPD exacerbation  Interventions:  Advised patient to self assesses COPD action plan zone and make appointment with provider if in the yellow zone for 48 hours without improvement. Provided education about and advised patient to utilize infection prevention strategies to reduce risk of respiratory infection   Patient Goals/Self-Care Activities:  eliminate symptom triggers at home - follow rescue plan if symptoms flare-up - keep follow-up appointments - use an extra pillow to sleep if needed Follow Up Plan: The care management team will reach out to the patient again over the next 30-60 days.  Plan: The patient has been provided with contact information for the care management team and has been advised to call with any health related questions or concerns.  [name], RN, BSN, CCM Care Management Coordinator [facility] Phone: [phone]: [phone]

## 2021-11-21 ENCOUNTER — Encounter: Payer: Medicare Other | Admitting: Physical Medicine and Rehabilitation

## 2021-11-22 NOTE — Assessment & Plan Note (Signed)
Patient was diagnosed with CAP with COPD exacerbation. She has completed 5 days of steriods x2 along with 3/5 days of Augmentin. She stopped the Augmentin due to GI side effects. She does not want to continue the augmentin. She states her symptoms improved from before but have not resolved. Her lungs are CTAB. Discussed starting a different antibiotic and following up in one week if no improvement. Also advised to take use Mucinex to clear up phlegm.  -Start Cefdinir 300 mg BID for 3 days -Follow up in one week if symptoms persists.  -Instructed on return precautions.

## 2021-11-24 NOTE — Progress Notes (Signed)
Internal Medicine Clinic Attending  Case discussed with Dr. Khan  At the time of the visit.  We reviewed the resident's history and exam and pertinent patient test results.  I agree with the assessment, diagnosis, and plan of care documented in the resident's note.  

## 2021-11-27 ENCOUNTER — Telehealth: Payer: Self-pay | Admitting: *Deleted

## 2021-11-27 ENCOUNTER — Ambulatory Visit: Payer: Medicare Other

## 2021-11-27 DIAGNOSIS — M25561 Pain in right knee: Secondary | ICD-10-CM | POA: Diagnosis not present

## 2021-11-27 DIAGNOSIS — M25562 Pain in left knee: Secondary | ICD-10-CM | POA: Diagnosis not present

## 2021-11-27 MED ORDER — HYDROCODONE-ACETAMINOPHEN 5-325 MG PO TABS: 1.0000 | ORAL_TABLET | ORAL | 0 refills | Status: DC | PRN

## 2021-11-27 NOTE — Telephone Encounter (Signed)
I was asked by Josephine Igo CM to call pt ; stated she had talked to the pt who has not been feeling well.  I called Kim Anthony - stated she has a cough; sometimes productive, yellow in color. Only appt available tomorrow is 0915 AM ; pt's unable to come in - telehealth appt schedule with Dr Humphrey Rolls.

## 2021-11-27 NOTE — Patient Instructions (Signed)
Visit Information  Thank you for taking time to visit with me today. Please don't hesitate to contact me if I can be of assistance to you before our next scheduled telephone appointment.  Our next appointment is by telephone on 12/04/21 at 1:00  Please call the care guide team at [phone] if you need to cancel or reschedule your appointment.   If you are experiencing a Mental Health or [place] or need someone to talk to, please call the [place] National Suicide Prevention Lifeline: (737) 450-8286 or TTY: (480)885-1197 TTY (623)716-8791) to talk to a trained counselor   Patient verbalizes understanding of instructions and care plan provided today and agrees to view in [place]. Active MyChart status and patient understanding of how to access instructions and care plan via MyChart confirmed with patient.     The patient has been provided with contact information for the care management team and has been advised to call with any health related questions or concerns.   [name], RN, BSN, CCM Care Management Coordinator [facility] Phone: [phone]: [phone]

## 2021-11-27 NOTE — Chronic Care Management (AMB) (Signed)
Care Management    RN Visit Note  11/27/2021 Name: Kim Anthony [name]: [id number] DOB: March 14, 1951  Subjective: Kim Anthony is a 71 y.o. year old female who is a primary care patient of Marianna Payment, MD. The care management team was consulted for assistance with disease management and care coordination needs.    Engaged with patient by telephone for follow up visit in response to provider referral for case management and/or care coordination services.   Consent to Services:   [name] was given information about Care Management services today including:  Care Management services includes personalized support from designated clinical staff supervised by her physician, including individualized plan of care and coordination with other care providers 24/7 contact phone numbers for assistance for urgent and routine care needs. The patient may stop case management services at any time by phone call to the office staff.  Patient agreed to services and consent obtained.   Assessment: Review of patient past medical history, allergies, medications, health status, including review of consultants reports, laboratory and other test data, was performed as part of comprehensive evaluation and provision of chronic care management services.   SDOH (Social Determinants of Health) assessments and interventions performed:    Care Plan  Allergies  Allergen Reactions   Lisinopril Swelling   Acetaminophen-Codeine Itching   Propoxyphene Itching    Darvocet   Tape Rash    Plastic   Tramadol Itching, Nausea And Vomiting and Rash    Outpatient Encounter Medications as of 11/27/2021  Medication Sig   albuterol (PROVENTIL) (2.5 MG/3ML) 0.083% nebulizer solution USE 1 VIAL IN NEBULIZER EVERY 6 HOURS AS NEEDED FOR WHEEZING FOR SHORTNESS OF BREATH   albuterol (VENTOLIN HFA) 108 (90 Base) MCG/ACT inhaler INHALE 2 PUFFS BY MOUTH EVERY 6 HOURS AS NEEDED FOR WHEEZING FOR SHORTNESS OF BREATH   dapagliflozin  propanediol (FARXIGA) 10 MG TABS tablet Take 1 tablet (10 mg total) by mouth daily.   diphenhydrAMINE (BENADRYL) 25 MG tablet Take 75 mg by mouth daily as needed for allergies. (Patient not taking: Reported on 08/11/2021)   docusate sodium (COLACE) 100 MG capsule Take 1 capsule (100 mg total) by mouth 2 (two) times daily as needed for mild constipation.   enoxaparin (LOVENOX) 80 MG/0.8ML injection Inject 0.8 mLs (80 mg total) into the skin every 12 (twelve) hours. Start 24 hours after your surgery. (Patient not taking: Reported on 08/18/2021)   famotidine (PEPCID) 20 MG tablet Take 1 tablet by mouth twice daily   fluticasone (FLONASE) 50 MCG/ACT nasal spray Place 1 spray into both nostrils daily as needed for allergies. (Patient taking differently: Place 1 spray into both nostrils daily.)   furosemide (LASIX) 40 MG tablet Take 1/2 (one-half) tablet by mouth once daily   gabapentin (NEURONTIN) 600 MG tablet TAKE 1 & 1/2 (ONE & ONE-HALF) TABLETS BY MOUTH THREE TIMES DAILY FOR  NERVE  PAIN   hydrALAZINE (APRESOLINE) 25 MG tablet Take 1 tablet (25 mg total) by mouth 3 (three) times daily.   HYDROcodone-acetaminophen (NORCO) 5-325 MG tablet Take 1 tablet by mouth every 4 (four) hours as needed for moderate pain.   isosorbide mononitrate (IMDUR) 30 MG 24 hr tablet Take 1 tablet (30 mg total) by mouth daily.   metoprolol succinate (TOPROL-XL) 25 MG 24 hr tablet Take 1 tablet (25 mg total) by mouth daily.   montelukast (SINGULAIR) 10 MG tablet Take 1 tablet (10 mg total) by mouth at bedtime.   nicotine (NICODERM CQ - DOSED IN  MG/24 HR) 7 mg/24hr patch Place 1 patch (7 mg total) onto the skin daily. (Patient not taking: Reported on 07/28/2021)   polyethylene glycol (MIRALAX / GLYCOLAX) 17 g packet Take 17 g by mouth daily.   potassium chloride SA (KLOR-CON M) 20 MEQ tablet Take 2 tablets (40 mEq total) by mouth daily.   simvastatin (ZOCOR) 20 MG tablet Take 1 tablet (20 mg total) by mouth daily.    Spacer/Aero-Holding Chambers (BREATHERITE COLL SPACER ADULT) MISC 1 applicator by Does not apply route daily.   spironolactone (ALDACTONE) 25 MG tablet Take 1 tablet by mouth once daily   STIOLTO RESPIMAT 2.5-2.5 MCG/ACT AERS INHALE 2 PUFFS BY MOUTH ONCE DAILY   warfarin (COUMADIN) 5 MG tablet TAKE 1& 1/2  TABLETS BY MOUTH ONCE DAILY AT 4 PM.   [DISCONTINUED] HYDROcodone-acetaminophen (NORCO) 5-325 MG tablet Take 1 tablet by mouth every 4 (four) hours as needed for moderate pain.   No facility-administered encounter medications on file as of 11/27/2021.    Patient Active Problem List   Diagnosis Date Noted   COPD exacerbation ([place]) 11/05/2021   Conjunctival hemorrhage of left eye 06/25/2021   Osteoarthritis of left knee 04/24/2021   Seronegative inflammatory arthritis 02/17/2021   High risk medication use 02/17/2021   Elevated transaminase level 01/27/2021   Acute idiopathic gout of multiple sites 09/30/2020   Chronic pain syndrome 01/17/2020   Monoarthritis of wrist 10/18/2019   Ganglion cyst 09/21/2019   Hip pain 08/23/2019   Thoracic ascending aortic aneurysm ([place]) 07/05/2019   Anemia 02/16/2019   Breast cancer ([place])    Acquired absence of left breast 12/20/2018   COPD (chronic obstructive pulmonary disease) ([place]) 11/25/2017   S/P MVR (mitral valve replacement)    Chronic anticoagulation    Tobacco use disorder 02/25/2017   Lymphadenopathy, mediastinal 02/25/2017   Essential hypertension 02/01/2016   Allergic rhinitis 02/01/2016   Healthcare maintenance 01/30/2016   Chronic low back pain 07/22/2015   Pulmonary embolism ([place]) 07/11/2015   Chronic combined systolic and diastolic heart failure ([place]) 07/11/2015   Headache, common migraine 07/11/2015   HLD (hyperlipidemia) 07/11/2015    Conditions to be addressed/monitored: CHF, HTN, and COPD  Care Plan : COPD (Adult)  Updates made by [name], RN since 11/27/2021 12:00 AM     Problem: Symptom Exacerbation (COPD)-  call provider when symptoms not improving despite   Priority: High     Long-Range Goal: Symptom Exacerbation Prevented or Minimized to prevent hospitalization or need for emergency treatment   Start Date: 06/20/2020  Recent Progress: On track  Priority: High  Note:   Current Barriers: Successful outreach to patient this afternoon for follow up from her appointment 11/20/21 and COPD exacerbation.  Patient states she is not feeling better after her completion of the antibiotic Omnicef 326m BIDx3 days. She is coughing up thick yellow mucous and has persistent cough. Collaborated with GMorrison Old RN, Triage nurse who was able to set up tele-visit with patient for in the morning.  Plan to review after appointment and check on patient next week. Knowledge Deficits related to plan of care for management of CHF, HTN, and COPD  Chronic Disease Management support and education needs related to CHF, HTN, and COPD   RNCM Clinical Goal(s):  Patient will verbalize understanding of plan for management of CHF, HTN, and COPD as evidenced by improving health status  through collaboration with RN Care manager, provider, and care team.   Interventions: 1:1 collaboration with primary care provider regarding development  and update of comprehensive plan of care as evidenced by provider attestation and co-signature Inter-disciplinary care team collaboration (see longitudinal plan of care) Evaluation of current treatment plan related to  self management and patient's adherence to plan as established by provider   Heart Failure Interventions:  (Status:  Goal on track:  Yes.) Long Term Goal Assessed need for readable accurate scales in home Advised patient to weigh each morning after emptying bladder Provided patient with education about the role of exercise in the management of heart failure  COPD Interventions:  (Status:  Goal on track:  Yes.) Long Term Goal Provided education about and advised patient to  utilize infection prevention strategies to reduce risk of respiratory infection Discussed the importance of adequate rest and management of fatigue with COPD   Hypertension Interventions:  (Status:  Goal on track:  Yes.) Long Term Goal Last practice recorded BP readings:  BP Readings from Last 3 Encounters:  11/20/21 130/72  11/13/21 127/76  11/04/21 125/74  Most recent eGFR/CrCl:  Lab Results  Component Value Date   EGFR 57 (L) 01/27/2021    No components found for: "CRCL"  Reviewed medications with patient and discussed importance of compliance Counseled on adverse effects of illicit drug and excessive alcohol use in patients with high blood pressure  Reviewed scheduled/upcoming provider appointments including:  Discussed the importance of smoking cessation.  Patient Goals/Self-Care Activities: Take all medications as prescribed Attend all scheduled provider appointments Call pharmacy for medication refills 3-7 days in advance of running out of medications Perform IADL's (shopping, preparing meals, housekeeping, managing finances) independently Call provider office for new concerns or questions  call office if I gain more than 2 pounds in one day or 5 pounds in one week do ankle pumps when sitting keep legs up while sitting track weight in diary use salt in moderation watch for swelling in feet, ankles and legs every day eat more whole grains, fruits and vegetables, lean meats and healthy fats eliminate smoking in my home identify and avoid work-related triggers begin a symptom diary eliminate symptom triggers at home check blood pressure 3 times per week call doctor for signs and symptoms of high blood pressure keep all doctor appointments take medications for blood pressure exactly as prescribed eat more whole grains, fruits and vegetables, lean meats and healthy fats  Follow Up Plan:  The patient has been provided with contact information for the care management team  and has been advised to call with any health related questions or concerns.   Current Barriers:  Knowledge deficits related to basic COPD self care/management-   Case Manager Clinical Goal(s): Over the next 30-60 days, patient will not be hospitalized for COPD exacerbation  Interventions:  Advised patient to self assesses COPD action plan zone and make appointment with provider if in the yellow zone for 48 hours without improvement. Provided education about and advised patient to utilize infection prevention strategies to reduce risk of respiratory infection   Patient Goals/Self-Care Activities:  eliminate symptom triggers at home - follow rescue plan if symptoms flare-up - keep follow-up appointments - use an extra pillow to sleep if needed Follow Up Plan: The care management team will reach out to the patient again over the next 30-60 days.       Plan: The patient has been provided with contact information for the care management team and has been advised to call with any health related questions or concerns.   [name], RN, BSN, CCM Care Management Coordinator  [facility] Phone: [phone]: [phone]

## 2021-11-27 NOTE — Telephone Encounter (Signed)
Kim Anthony called to request a refill on her hydrocodone. She was sick and missed her appt and the next available is in September.Last fill date was 10/31/21.

## 2021-11-28 ENCOUNTER — Ambulatory Visit (INDEPENDENT_AMBULATORY_CARE_PROVIDER_SITE_OTHER): Payer: Medicare Other | Admitting: Internal Medicine

## 2021-11-28 DIAGNOSIS — J309 Allergic rhinitis, unspecified: Secondary | ICD-10-CM

## 2021-11-28 DIAGNOSIS — F17200 Nicotine dependence, unspecified, uncomplicated: Secondary | ICD-10-CM

## 2021-11-28 DIAGNOSIS — R052 Subacute cough: Secondary | ICD-10-CM | POA: Diagnosis not present

## 2021-11-28 MED ORDER — NICOTINE 21 MG/24HR TD PT24: 21.0000 mg | MEDICATED_PATCH | TRANSDERMAL | 2 refills | Status: AC

## 2021-11-28 MED ORDER — LORATADINE 10 MG PO TABS: 10.0000 mg | ORAL_TABLET | Freq: Every day | ORAL | 0 refills | Status: DC

## 2021-11-28 NOTE — Assessment & Plan Note (Signed)
Appears 2/2 to her hx of allergic rhinitis.  -Add Claritin daily -Increase Flonase to 1 spray up to 2 times a day.   -Use neti pot and humidifier for relieve -Encouraged smoking cessation.  -Return precautions given.

## 2021-11-28 NOTE — Assessment & Plan Note (Deleted)
Patient had CAP resulting in COPD exacerbation which was treated with abx and steroids. She had coughing with CAP and COPD exacerbation. Her other symptoms have resolved and now she is having coughing at night time and minimally during the day.  Denies other infectious symptoms. Endorses sensation of post nasal drip. Appears 2/2 to her hx of allergic rhinitis.  -Add Claritin daily -Increase Flonase to 1 spray up to 2 times a day.   -Use neti pot and humidifier for relieve -Encouraged smoking cessation.  -Return precautions given.

## 2021-11-28 NOTE — Telephone Encounter (Signed)
Notified of refill. 

## 2021-11-28 NOTE — Progress Notes (Signed)
  [facility] Res[facility] Telephone Encounter Continuity Care Appointment  HPI:  This telephone encounter was created for [name] on 11/28/2021 for the following purpose/cc coughing.   Past Medical History:  Past Medical History:  Diagnosis Date   Arthritis    Asthma    Breast cancer ([place]) 2001   Left Breast Cancer   CHF (congestive heart failure) (H[facility])    COPD (chronic obstructive pulmonary disease) (H[facility])    Coronary artery disease    GERD (gastroesophageal reflux disease)    History of mitral valve replacement with mechanical valve    HLD (hyperlipidemia)    Hypertension    Pulmonary embolism ([place]) 2021     ROS:  Negative for dyspnea, chest pain, or fevers.    Assessment / Plan / Recommendations:  Please see A&P under problem oriented charting for assessment of the patient's acute and chronic medical conditions.  As always, pt is advised that if symptoms worsen or new symptoms arise, they should go to an urgent care facility or to to ER for further evaluation.   Consent and Medical Decision Making:  Patient discussed with Dr. Dareen Piano This is a telephone encounter between Kim Anthony and Idamae Schuller on 11/28/2021 for cough after pneumonia. The visit was conducted with the patient located at home and Idamae Schuller at [facility]. The patient's identity was confirmed using their DOB and current address. The patient has consented to being evaluated through a telephone encounter and understands the associated risks (an examination cannot be done and the patient may need to come in for an appointment) / benefits (allows the patient to remain at home, decreasing exposure to coronavirus). I personally spent 21 minutes on medical discussion.

## 2021-12-01 NOTE — Progress Notes (Signed)
Internal Medicine Clinic Attending  Case discussed with Dr. Khan  At the time of the visit.  We reviewed the resident's history and exam and pertinent patient test results.  I agree with the assessment, diagnosis, and plan of care documented in the resident's note.  

## 2021-12-03 DIAGNOSIS — M25561 Pain in right knee: Secondary | ICD-10-CM | POA: Diagnosis not present

## 2021-12-03 DIAGNOSIS — M25562 Pain in left knee: Secondary | ICD-10-CM | POA: Diagnosis not present

## 2021-12-04 ENCOUNTER — Other Ambulatory Visit: Payer: Self-pay | Admitting: Internal Medicine

## 2021-12-04 ENCOUNTER — Telehealth: Payer: Medicare Other

## 2021-12-04 DIAGNOSIS — K219 Gastro-esophageal reflux disease without esophagitis: Secondary | ICD-10-CM

## 2021-12-08 DIAGNOSIS — Z952 Presence of prosthetic heart valve: Secondary | ICD-10-CM | POA: Diagnosis not present

## 2021-12-08 DIAGNOSIS — I2782 Chronic pulmonary embolism: Secondary | ICD-10-CM | POA: Diagnosis not present

## 2021-12-08 DIAGNOSIS — Z7901 Long term (current) use of anticoagulants: Secondary | ICD-10-CM | POA: Diagnosis not present

## 2021-12-10 ENCOUNTER — Ambulatory Visit: Payer: Self-pay

## 2021-12-10 ENCOUNTER — Telehealth: Payer: Self-pay | Admitting: Pharmacist

## 2021-12-10 NOTE — Telephone Encounter (Signed)
Next INR should be 26-JUN-23--not as originally cited (26-JAN-23). Thank you.

## 2021-12-10 NOTE — Chronic Care Management (AMB) (Signed)
   12/10/2021  Kim Anthony April 29, 1951 [id number]  Care coordination case closed. [name], RN, BSN, CCM Care Management Coordinator [facility] Phone: [phone]: [phone]

## 2021-12-10 NOTE — Telephone Encounter (Signed)
Called daughter concerning INR reports. She last texted me on 24-APR-23. INRs have continued to have been performed and transmitted to [facility] Patient Self Testing Service.   Patient's daughter has been transmitting data to [facility] but not texting or calling to me. Data was faxed by mdINR on 20-JUN-23 at 10:58am to the [facility] and made available to me today, 21-JUN-23 at 10:24h.  The following INRs are being recorded documented: 11/28/21 @ 1230h = 1.4 11/17/21 @ 1047h = 3.1 11/10/21 @ 1946h = 3.0 11/03/21 @ 1047h = 2.0 10/27/21 @ 1046h = 2.3 10/20/21 @ 1046h = 2.5 10/14/21 @ 2045h = 2.0 10/07/21 @ 2029h = 1.8 10/02/21 @ 2108h = 3.0 09/30/21 @ 0803h = 4.1  The daughter [name]) indicates today during phone call that she last performed a FS POC INR on her mom on 19-JUN-23, result = 3.0 while taking one (1) of her '5mg'$  peach-colored warfarin tablets by mouth, once-daily. Will continue this regimen and have INR repeated on 26-JAN-23 and called to me.

## 2021-12-13 ENCOUNTER — Encounter: Payer: Self-pay | Admitting: *Deleted

## 2021-12-15 ENCOUNTER — Other Ambulatory Visit: Payer: Self-pay | Admitting: Internal Medicine

## 2021-12-15 ENCOUNTER — Telehealth: Payer: Self-pay | Admitting: Pharmacist

## 2021-12-15 DIAGNOSIS — J449 Chronic obstructive pulmonary disease, unspecified: Secondary | ICD-10-CM

## 2021-12-15 DIAGNOSIS — K219 Gastro-esophageal reflux disease without esophagitis: Secondary | ICD-10-CM

## 2021-12-16 ENCOUNTER — Telehealth: Payer: Self-pay | Admitting: *Deleted

## 2021-12-16 NOTE — Telephone Encounter (Signed)
Pt's calling about refills for Albuterol and Stiolto inhalers; informed they were refilled this afternoon. Stated she will call the pharmacy.

## 2021-12-17 ENCOUNTER — Telehealth: Payer: Self-pay

## 2021-12-17 NOTE — Telephone Encounter (Signed)
Patient is calling for a refill on Hydrocodone. Per PMP, last fill was 11/27/21

## 2021-12-18 MED ORDER — HYDROCODONE-ACETAMINOPHEN 5-325 MG PO TABS: 1.0000 | ORAL_TABLET | ORAL | 0 refills | Status: DC | PRN

## 2021-12-18 NOTE — Addendum Note (Signed)
Addended by: Courtney Heys on: 12/18/2021 12:09 PM   Modules accepted: Orders

## 2021-12-19 DIAGNOSIS — M1712 Unilateral primary osteoarthritis, left knee: Secondary | ICD-10-CM | POA: Diagnosis not present

## 2021-12-19 DIAGNOSIS — M25562 Pain in left knee: Secondary | ICD-10-CM | POA: Diagnosis not present

## 2021-12-19 NOTE — Telephone Encounter (Signed)
Attempted to call patient but no voicemail set up

## 2021-12-24 ENCOUNTER — Ambulatory Visit: Payer: Self-pay

## 2021-12-24 NOTE — Chronic Care Management (AMB) (Signed)
   12/24/2021  Kim Anthony February 21, 1951 [id number]  Incoming call from patient who is not feeling well and she is out of town.  She notes she is returning to [facility], but cannot get into the clinic until Monday.  Encouraged patient to go to Urgent Care or the Emergency Room if she is feeling worse and cannot wait until she gets home.  She shared her eyes are red and she is congested.  She denies dyspnea and notes a little pedal edema.  Patient to follow up with clinic after she returns in town. [name], RN, BSN, CCM Care Management Coordinator [facility] Phone: [phone]: [phone]

## 2021-12-25 ENCOUNTER — Emergency Department (HOSPITAL_COMMUNITY): Payer: Medicare Other

## 2021-12-25 ENCOUNTER — Encounter (HOSPITAL_COMMUNITY): Payer: Self-pay | Admitting: Emergency Medicine

## 2021-12-25 ENCOUNTER — Other Ambulatory Visit: Payer: Self-pay

## 2021-12-25 ENCOUNTER — Telehealth: Payer: Medicare Other

## 2021-12-25 ENCOUNTER — Emergency Department (HOSPITAL_COMMUNITY)
Admission: EM | Admit: 2021-12-25 | Discharge: 2021-12-26 | Disposition: A | Discharge status: Home / Self Care | Payer: Medicare Other | Attending: Emergency Medicine | Admitting: Emergency Medicine

## 2021-12-25 DIAGNOSIS — I5043 Acute on chronic combined systolic (congestive) and diastolic (congestive) heart failure: Secondary | ICD-10-CM | POA: Diagnosis not present

## 2021-12-25 DIAGNOSIS — J439 Emphysema, unspecified: Secondary | ICD-10-CM | POA: Diagnosis not present

## 2021-12-25 DIAGNOSIS — J449 Chronic obstructive pulmonary disease, unspecified: Secondary | ICD-10-CM | POA: Diagnosis not present

## 2021-12-25 DIAGNOSIS — R0600 Dyspnea, unspecified: Secondary | ICD-10-CM | POA: Diagnosis not present

## 2021-12-25 DIAGNOSIS — I110 Hypertensive heart disease with heart failure: Secondary | ICD-10-CM | POA: Diagnosis not present

## 2021-12-25 DIAGNOSIS — R600 Localized edema: Secondary | ICD-10-CM | POA: Insufficient documentation

## 2021-12-25 DIAGNOSIS — F17200 Nicotine dependence, unspecified, uncomplicated: Secondary | ICD-10-CM | POA: Diagnosis not present

## 2021-12-25 DIAGNOSIS — I5042 Chronic combined systolic (congestive) and diastolic (congestive) heart failure: Secondary | ICD-10-CM

## 2021-12-25 DIAGNOSIS — J701 Chronic and other pulmonary manifestations due to radiation: Secondary | ICD-10-CM | POA: Diagnosis not present

## 2021-12-25 DIAGNOSIS — J90 Pleural effusion, not elsewhere classified: Secondary | ICD-10-CM | POA: Insufficient documentation

## 2021-12-25 DIAGNOSIS — R0602 Shortness of breath: Secondary | ICD-10-CM | POA: Diagnosis present

## 2021-12-25 DIAGNOSIS — M954 Acquired deformity of chest and rib: Secondary | ICD-10-CM | POA: Diagnosis not present

## 2021-12-25 DIAGNOSIS — J9811 Atelectasis: Secondary | ICD-10-CM | POA: Diagnosis not present

## 2021-12-25 DIAGNOSIS — Z7951 Long term (current) use of inhaled steroids: Secondary | ICD-10-CM | POA: Diagnosis not present

## 2021-12-25 DIAGNOSIS — I2510 Atherosclerotic heart disease of native coronary artery without angina pectoris: Secondary | ICD-10-CM | POA: Diagnosis not present

## 2021-12-25 DIAGNOSIS — I509 Heart failure, unspecified: Secondary | ICD-10-CM | POA: Diagnosis not present

## 2021-12-25 DIAGNOSIS — J811 Chronic pulmonary edema: Secondary | ICD-10-CM | POA: Diagnosis not present

## 2021-12-25 DIAGNOSIS — Z7901 Long term (current) use of anticoagulants: Secondary | ICD-10-CM | POA: Diagnosis not present

## 2021-12-25 LAB — CBC WITH DIFFERENTIAL/PLATELET
Abs Immature Granulocytes: 0.03 10*3/uL (ref 0.00–0.07)
Basophils Absolute: 0.1 10*3/uL (ref 0.0–0.1)
Basophils Relative: 1 %
Eosinophils Absolute: 0.4 10*3/uL (ref 0.0–0.5)
Eosinophils Relative: 4 %
HCT: 39.3 % (ref 36.0–46.0)
Hemoglobin: 12.4 g/dL (ref 12.0–15.0)
Immature Granulocytes: 0 %
Lymphocytes Relative: 26 %
Lymphs Abs: 2.3 10*3/uL (ref 0.7–4.0)
MCH: 31.8 pg (ref 26.0–34.0)
MCHC: 31.6 g/dL (ref 30.0–36.0)
MCV: 100.8 fL — ABNORMAL HIGH (ref 80.0–100.0)
Monocytes Absolute: 0.8 10*3/uL (ref 0.1–1.0)
Monocytes Relative: 8 %
Neutro Abs: 5.5 10*3/uL (ref 1.7–7.7)
Neutrophils Relative %: 61 %
Platelets: 367 10*3/uL (ref 150–400)
RBC: 3.9 MIL/uL (ref 3.87–5.11)
RDW: 12.1 % (ref 11.5–15.5)
WBC: 9.1 10*3/uL (ref 4.0–10.5)
nRBC: 0 % (ref 0.0–0.2)

## 2021-12-25 LAB — COMPREHENSIVE METABOLIC PANEL
ALT: 22 U/L (ref 0–44)
AST: 35 U/L (ref 15–41)
Albumin: 3.4 g/dL — ABNORMAL LOW (ref 3.5–5.0)
Alkaline Phosphatase: 122 U/L (ref 38–126)
Anion gap: 11 (ref 5–15)
BUN: 24 mg/dL — ABNORMAL HIGH (ref 8–23)
CO2: 19 mmol/L — ABNORMAL LOW (ref 22–32)
Calcium: 9.6 mg/dL (ref 8.9–10.3)
Chloride: 109 mmol/L (ref 98–111)
Creatinine, Ser: 1.1 mg/dL — ABNORMAL HIGH (ref 0.44–1.00)
GFR, Estimated: 53 mL/min — ABNORMAL LOW (ref >60)
Glucose, Bld: 108 mg/dL — ABNORMAL HIGH (ref 70–99)
Potassium: 3.6 mmol/L (ref 3.5–5.1)
Sodium: 139 mmol/L (ref 135–145)
Total Bilirubin: 0.8 mg/dL (ref 0.3–1.2)
Total Protein: 7.6 g/dL (ref 6.5–8.1)

## 2021-12-25 LAB — BRAIN NATRIURETIC PEPTIDE: B Natriuretic Peptide: 282.6 pg/mL — ABNORMAL HIGH (ref 0.0–100.0)

## 2021-12-25 NOTE — ED Provider Triage Note (Signed)
Emergency Medicine Provider Triage Evaluation Note  NELLA BOTSFORD , a 71 y.o. female  was evaluated in triage.  Pt complains of shortness of breath.  Ongoing since 12/22/2021.  Was using breathing treatments with only minimal improvement in symptoms.  Denies any chest pain.  Reports slight cough.  Had some bilateral lower extremity edema that has gradually improved.  No fever.  No sick contacts with similar symptoms.  Review of Systems  Positive: Shortness of breath, cough Negative: Chest pain  Physical Exam  BP (!) 125/103 (BP Location: Right Arm)   Pulse (!) 122   Temp 98.5 F (36.9 C) (Oral)   Resp (!) 29   SpO2 97%  Gen:   Awake, no distress   Resp:  Normal effort  MSK:   Moves extremities without difficulty  Other:  Patient tachycardic, tachypneic, no lower extremity edema  Medical Decision Making  Medically screening exam initiated at 8:45 PM.  Appropriate orders placed.  LASONYA HUBNER was informed that the remainder of the evaluation will be completed by another provider, this initial triage assessment does not replace that evaluation, and the importance of remaining in the ED until their evaluation is complete.  Labs and imaging ordered   Delia Heady, [place] 12/25/21 2046

## 2021-12-25 NOTE — ED Triage Notes (Signed)
Pt reported to ED with c/o shortness of breath x2 days. [place]. Denies any chest pain at this time. Also endorses eye pain and sniffles.

## 2021-12-26 ENCOUNTER — Emergency Department (HOSPITAL_COMMUNITY): Payer: Medicare Other

## 2021-12-26 DIAGNOSIS — J90 Pleural effusion, not elsewhere classified: Secondary | ICD-10-CM | POA: Diagnosis not present

## 2021-12-26 DIAGNOSIS — J701 Chronic and other pulmonary manifestations due to radiation: Secondary | ICD-10-CM | POA: Diagnosis not present

## 2021-12-26 DIAGNOSIS — M954 Acquired deformity of chest and rib: Secondary | ICD-10-CM | POA: Diagnosis not present

## 2021-12-26 DIAGNOSIS — I2510 Atherosclerotic heart disease of native coronary artery without angina pectoris: Secondary | ICD-10-CM | POA: Diagnosis not present

## 2021-12-26 DIAGNOSIS — J439 Emphysema, unspecified: Secondary | ICD-10-CM | POA: Diagnosis not present

## 2021-12-26 MED ORDER — METOPROLOL SUCCINATE ER 25 MG PO TB24
25.0000 mg | ORAL_TABLET | Freq: Every day | ORAL | Status: DC
  Administered 2021-12-26: 25 mg via ORAL
  Filled 2021-12-26: qty 1

## 2021-12-26 MED ORDER — IOHEXOL 350 MG/ML SOLN
100.0000 mL | Freq: Once | INTRAVENOUS | Status: AC | PRN
  Administered 2021-12-26: 75 mL via INTRAVENOUS

## 2021-12-26 MED ORDER — FUROSEMIDE 40 MG PO TABS: 40.0000 mg | ORAL_TABLET | Freq: Two times a day (BID) | ORAL | 3 refills | Status: DC

## 2021-12-26 MED ORDER — FUROSEMIDE 10 MG/ML IJ SOLN
80.0000 mg | Freq: Once | INTRAMUSCULAR | Status: AC
Start: 2021-12-26 — End: 2021-12-26
  Administered 2021-12-26: 80 mg via INTRAVENOUS
  Filled 2021-12-26: qty 8

## 2021-12-26 NOTE — ED Provider Notes (Signed)
Patient initially seen by Dr. Dayna Barker.  Please see his note.  Patient states that she is feeling better.  Not on nasal cannula supplementation.  Was able to ambulate around the ED without difficulty, o2 sat stayed above 90.  Will dc home with increased lasix dose, close outpt follow up   Dorie Rank, MD 12/26/21 1026

## 2021-12-26 NOTE — Discharge Instructions (Addendum)
Increase your Lasix to 1 tablet twice daily for the next 5 days then return back to your usual dose of 1 tablet daily.  Follow-up closely with your doctor to be rechecked.  Return as needed for worsening symptoms

## 2021-12-26 NOTE — ED Notes (Signed)
Two RNs attempted IV, no success. IV team consult ordered.

## 2021-12-26 NOTE — ED Provider Notes (Signed)
[facility] EMERGENCY DEPARTMENT Provider Note   CSN: [id number] Arrival date & time: 12/25/21  2007     History  Chief Complaint  Patient presents with   Shortness of Breath    Kim Anthony is a 71 y.o. female.  71 year old female with a history of COPD and CHF presents the ER today with couple days of dyspnea.  Patient states that she still smokes.  She states that she has had a little bit of a productive cough with mild yellow sputum.  She states that she is not any chest pain but did have some pretty swollen legs the last few days of recently gotten a little better.  She states that she takes Lasix and she has not missed any doses or change her dose recently.  Her oxygen was little bit low in triage but not below 90 but is on 2 L right now and satting 100%.  No history of blood clots.  Does endorse orthopnea and dyspnea on exertion.   Shortness of Breath   Home Medications Prior to Admission medications   Medication Sig Start Date End Date Taking? Authorizing Provider  albuterol (PROVENTIL) (2.5 MG/3ML) 0.083% nebulizer solution USE 1 VIAL IN NEBULIZER EVERY 6 HOURS AS NEEDED FOR WHEEZING FOR SHORTNESS OF BREATH 11/04/21   Jose Persia, MD  albuterol (VENTOLIN HFA) 108 (90 Base) MCG/ACT inhaler INHALE 2 PUFFS BY MOUTH EVERY 6 HOURS AS NEEDED FOR WHEEZING FOR SHORTNESS OF BREATH 12/16/21   Farrel Gordon, DO  dapagliflozin propanediol (FARXIGA) 10 MG TABS tablet Take 1 tablet (10 mg total) by mouth daily. 04/23/21   Masters, Joellen Jersey, DO  diphenhydrAMINE (BENADRYL) 25 MG tablet Take 75 mg by mouth daily as needed for allergies. Patient not taking: Reported on 08/11/2021    [provider]  docusate sodium (COLACE) 100 MG capsule Take 1 capsule (100 mg total) by mouth 2 (two) times daily as needed for mild constipation. 08/06/21   Susa Day, MD  enoxaparin (LOVENOX) 80 MG/0.8ML injection Inject 0.8 mLs (80 mg total) into the skin every 12 (twelve) hours.  Start 24 hours after your surgery. Patient not taking: Reported on 08/18/2021 08/11/21   Pennie Banter, RPH-CPP  famotidine (PEPCID) 20 MG tablet Take 1 tablet by mouth twice daily 12/05/21   Marianna Payment, MD  fluticasone [facility]) 50 MCG/ACT nasal spray Place 1 spray into both nostrils daily as needed for allergies. Patient taking differently: Place 1 spray into both nostrils daily. 07/11/21 07/11/22  Marianna Payment, MD  furosemide (LASIX) 40 MG tablet Take 1/2 (one-half) tablet by mouth once daily 11/18/21   Bensimhon, Shaune Pascal, MD  gabapentin (NEURONTIN) 600 MG tablet TAKE 1 & 1/2 (ONE & ONE-HALF) TABLETS BY MOUTH THREE TIMES DAILY FOR  NERVE  PAIN 09/29/21   Lovorn, Jinny Blossom, MD  hydrALAZINE (APRESOLINE) 25 MG tablet Take 1 tablet (25 mg total) by mouth 3 (three) times daily. 02/13/21   Bensimhon, Shaune Pascal, MD  HYDROcodone-acetaminophen (NORCO) 5-325 MG tablet Take 1 tablet by mouth every 4 (four) hours as needed for moderate pain. 12/18/21   Lovorn, Jinny Blossom, MD  isosorbide mononitrate (IMDUR) 30 MG 24 hr tablet Take 1 tablet (30 mg total) by mouth daily. 04/23/21   Masters, Katie, DO  loratadine (CLARITIN) 10 MG tablet Take 1 tablet (10 mg total) by mouth daily. 11/28/21 02/26/22  Idamae Schuller, MD  metoprolol succinate (TOPROL-XL) 25 MG 24 hr tablet Take 1 tablet (25 mg total) by mouth daily. 06/06/21  Angelica Pou, MD  montelukast (SINGULAIR) 10 MG tablet Take 1 tablet (10 mg total) by mouth at bedtime. 04/23/21   Masters, Katie, DO  nicotine (NICODERM CQ - DOSED IN MG/24 HOURS) 21 mg/24hr patch Place 1 patch (21 mg total) onto the skin daily. 11/28/21 02/26/22  Idamae Schuller, MD  polyethylene glycol (MIRALAX / GLYCOLAX) 17 g packet Take 17 g by mouth daily. 08/06/21   Susa Day, MD  potassium chloride SA (KLOR-CON M) 20 MEQ tablet Take 2 tablets (40 mEq total) by mouth daily. 08/01/21   Bensimhon, Shaune Pascal, MD  simvastatin (ZOCOR) 20 MG tablet Take 1 tablet (20 mg total) by mouth daily. 04/23/21   Masters,  Katie, DO  Spacer/Aero-Holding Chambers (BREATHERITE COLL SPACER ADULT) MISC 1 applicator by Does not apply route daily. 01/11/20   [place], Nila Nephew D, DO  spironolactone (ALDACTONE) 25 MG tablet Take 1 tablet by mouth once daily 11/19/21   Bensimhon, Shaune Pascal, MD  STIOLTO RESPIMAT 2.5-2.5 MCG/ACT AERS INHALE 2 PUFFS BY MOUTH ONCE DAILY 12/16/21   Farrel Gordon, DO  warfarin (COUMADIN) 5 MG tablet TAKE 1& 1/2  TABLETS BY MOUTH ONCE DAILY AT 4 PM. 08/11/21   Pennie Banter, RPH-CPP      Allergies    Lisinopril, Acetaminophen-codeine, Propoxyphene, Tape, and Tramadol    Review of Systems   Review of Systems  Respiratory:  Positive for shortness of breath.     Physical Exam Updated Vital Signs BP (!) 146/87   Pulse (!) 116   Temp 97.8 F (36.6 C) (Oral)   Resp 16   SpO2 94%  Physical Exam Vitals and nursing note reviewed.  Constitutional:      Appearance: She is well-developed.  HENT:     Head: Normocephalic and atraumatic.  Cardiovascular:     Rate and Rhythm: Normal rate and regular rhythm.  Pulmonary:     Effort: No respiratory distress.     Breath sounds: No stridor. Examination of the right-lower field reveals rales. Examination of the left-lower field reveals rales. Rales present. No decreased breath sounds or wheezing.  Abdominal:     General: There is no distension.  Musculoskeletal:     Cervical back: Normal range of motion.     Right lower leg: Edema present.     Left lower leg: Edema present.  Skin:    General: Skin is warm and dry.  Neurological:     General: No focal deficit present.     Mental Status: She is alert.     ED Results / Procedures / Treatments   Labs (all labs ordered are listed, but only abnormal results are displayed) Labs Reviewed  COMPREHENSIVE METABOLIC PANEL - Abnormal; Notable for the following components:      Result Value   CO2 19 (*)    Glucose, Bld 108 (*)    BUN 24 (*)    Creatinine, Ser 1.10 (*)    Albumin 3.4 (*)    GFR,  Estimated 53 (*)    All other components within normal limits  CBC WITH DIFFERENTIAL/PLATELET - Abnormal; Notable for the following components:   MCV 100.8 (*)    All other components within normal limits  BRAIN NATRIURETIC PEPTIDE - Abnormal; Notable for the following components:   B Natriuretic Peptide 282.6 (*)    All other components within normal limits    EKG None  Radiology DG Chest 2 View  Result Date: 12/25/2021 CLINICAL DATA:  Difficulty breathing. EXAM: CHEST - 2 VIEW  COMPARISON:  Nov 13, 2021 FINDINGS: Multiple sternal wires are noted. There is stable moderate to marked severity enlargement of the cardiac silhouette. An artificial mitral valve is seen. Stable diffusely increased interstitial lung markings are present. Mild atelectasis is seen within the bilateral lung bases and periphery of the mid left lung. There is mild, stable prominence of the perihilar pulmonary vasculature. There is no evidence of a pleural effusion or pneumothorax. The visualized skeletal structures are unremarkable. IMPRESSION: 1. Stable cardiomegaly with mild pulmonary vascular congestion. 2. Chronic interstitial lung disease with mild bilateral atelectatic changes. Electronically Signed   By: Virgina Norfolk M.D.   On: 12/25/2021 21:19    Procedures Procedures    Medications Ordered in ED Medications - No data to display  ED Course/ Medical Decision Making/ A&P                           Medical Decision Making Amount and/or Complexity of Data Reviewed Radiology: ordered.  Risk Prescription drug management.  Symptoms seem to be more consistent with mild pulmonary edema rather than COPD.  Her BNP is not very high so we will need to do a CT angio to evaluate for PE.  If this is negative we will diurese a little bit and then she can likely be discharged with an increased dose of her Lasix for now.  CTA PE study done and showed pleural effusion and mild HF, no PE on limited study (independently  viewed and interpreted by myself and radiology read reviewed).  Lasix ordered. Off oxygen for now. Will ensure some diuresis and likely d/c on increased lasix with outpatient follow up.   Care transferred pending reassessment. Already off oxygen.    Final Clinical Impression(s) / ED Diagnoses Final diagnoses:  None    Rx / DC Orders ED Discharge Orders     None         Tanyia Grabbe, Corene Cornea, MD 12/26/21 [phone]

## 2021-12-26 NOTE — ED Notes (Signed)
Patient ambulated to bathroom with oxygen sats staying 90-95.

## 2021-12-26 NOTE — ED Notes (Signed)
Patient transported to CT 

## 2021-12-26 NOTE — ED Notes (Signed)
According to CT, pt's IV flushed and blood return noted. When given contrast, the IV infiltrated. CT removed IV and applied warm compress and elevated arm.

## 2021-12-29 ENCOUNTER — Telehealth: Payer: Self-pay | Admitting: *Deleted

## 2021-12-29 DIAGNOSIS — C50912 Malignant neoplasm of unspecified site of left female breast: Secondary | ICD-10-CM | POA: Diagnosis not present

## 2021-12-29 NOTE — Telephone Encounter (Signed)
Received call from [place] with [facility] for silicone prosthetics and bras s/p mastectomy. States patient came in today for these products and they need name and NPI of PCP to file insurance. She was made aware patient no showed today's appt with PCP.

## 2021-12-30 ENCOUNTER — Telehealth: Payer: Self-pay | Admitting: Pharmacist

## 2021-12-30 NOTE — Telephone Encounter (Signed)
Was provided results of patient-self-testing, finger-stick, point of care INR results = 3.6 (target goal 2.5 - 3.5) on 42.'5mg'$  warfarin/wk. Reduced to '40mg'$  warfarin/wk. Repeat INR [id number].

## 2022-01-02 ENCOUNTER — Ambulatory Visit: Payer: Medicare Other

## 2022-01-06 ENCOUNTER — Telehealth: Payer: Self-pay | Admitting: *Deleted

## 2022-01-06 NOTE — Telephone Encounter (Signed)
Call from patient c/o swelling in legs.  Requests appointment for Friday.  Patient is currently out of town and will return on Thursday sometime.  Noted some shortness of breath when talking to patient.  Stated no color changes in skin noted. Advised to seek care out of town for the shortness of breath.  Refused stating that she wanted to wait until she returns to [place].  Fraser Din was advised and agreed if shortness of breath worsens will go to an Urgent Care or ER out of town.

## 2022-01-09 ENCOUNTER — Ambulatory Visit (INDEPENDENT_AMBULATORY_CARE_PROVIDER_SITE_OTHER): Payer: Medicare Other

## 2022-01-09 VITALS — BP 130/88 | HR 99 | Temp 98.1°F | Ht 65.0 in | Wt 181.9 lb

## 2022-01-09 DIAGNOSIS — F17210 Nicotine dependence, cigarettes, uncomplicated: Secondary | ICD-10-CM | POA: Diagnosis not present

## 2022-01-09 DIAGNOSIS — M1712 Unilateral primary osteoarthritis, left knee: Secondary | ICD-10-CM | POA: Diagnosis not present

## 2022-01-09 DIAGNOSIS — I5042 Chronic combined systolic (congestive) and diastolic (congestive) heart failure: Secondary | ICD-10-CM | POA: Diagnosis not present

## 2022-01-09 DIAGNOSIS — M064 Inflammatory polyarthropathy: Secondary | ICD-10-CM | POA: Diagnosis not present

## 2022-01-09 DIAGNOSIS — M25562 Pain in left knee: Secondary | ICD-10-CM | POA: Diagnosis not present

## 2022-01-09 LAB — BRAIN NATRIURETIC PEPTIDE: B Natriuretic Peptide: 230.3 pg/mL — ABNORMAL HIGH (ref 0.0–100.0)

## 2022-01-09 NOTE — Assessment & Plan Note (Signed)
>>  ASSESSMENT AND PLAN FOR BIVENTRICULAR HEART FAILURE WITH REDUCED LEFT VENTRICULAR FUNCTION (HCC) WRITTEN ON 01/09/2022  9:52 AM BY Willette Cluster, MD   Seen in the ED 12/25/21- No PE, mild pulmonary edema on CT, D/ced on increased lasix. Last echo february 2023 with EF 50-55%, mild RV systolic dysfunction, RA + LA mod dilated, no regurg of mechanical mitral valve, mild to mod tricuspid regurg, no aortic regurg or stenosis.Her dry weight is between 170-176 lbs. She presents today with 4 days of increased dyspnea and swelling in her legs. She ahs been taking lasix 40mg  BID and took 60 mg yesterday. On exam she has JVD to the earlobes, 2+ bilateral lower extremity pitting edema to the shins. Her lungs are clear without crackles or wheezes and there is no murmur on cardiac exam. Her vitals are wnl and she is saturating at 97% on room air. Her weight was at 181 lbs. Given her exam she seems to be volume overloaded from right heart failure. There is no concern for COPD exacerbation given clear lung exam. Since her vitals are stable this can be treated at home with close follow up next week. -Increase to Lasix 60mg  BID -Farxiga 10 mg daily -spiro 25 mg daily -toprol XL-25 daily -hydralazine 25 tid, imdur 30 daily -BMP and BNP -Will need cardiology follow up soon

## 2022-01-09 NOTE — Progress Notes (Deleted)
Combined systolic and diastolic heartfailure: Seen in the ED 12/25/21. No PE, mild pulmonary edema on CT. D/ced on increased lasix. Last echo february 2023 with EF 50-55%: RA + LA mod dilated, no regurg of mechanical mitral valve, mild to mod tricuspid regurg, no aortic regurg or stenosis.dry weight 176 lbs. Dr Haroldine Laws is her cardiologist.  -Wilder Glade 10 mg daily -lasix 20 mg daily -spiro 25 mg daily -toprol XL-25 daily -hydralazine 25 tid, imdur 30 daily  Mitral regurg s/p mechanical valve replacement -warfarin 5 mg  COPD -albuterol -singulair(montelukast) 10 mg -stiolto respimate(tiotropium-olodaterol) 2.5-2.5 mcg  Thoracic ascending aortict. aneurysm 4.1x4 cm yearly CTA or MRA  Breast cancer axillary disection LUL radiation fibrosis  HTN -toprol '25mg'$   -CADHLD:Mid RCA lesion is 100% stenosed,Ost Cx to Prox Cx lesion is 30% stenosed,Dist Cx lesion is 30% stenosed,Dist LAD lesion is 40% stenosed. -simvastatin 20 mg daily -lipids(last done 2022 with LDL 76)-increase to high intensity  Macrocytosis- MCV 100. Drinking? B12 vs folate?  HCM: Mammo normal 2022 Colonoscopy 2019 1 polyp Dexa- osteopenia 2019

## 2022-01-09 NOTE — Assessment & Plan Note (Signed)
>>  ASSESSMENT AND PLAN FOR CHF (CONGESTIVE HEART FAILURE) (HCC) WRITTEN ON 09/14/2023 11:43 AM BY Shanice Poznanski, DO   >>ASSESSMENT AND PLAN FOR BIVENTRICULAR HEART FAILURE WITH REDUCED LEFT VENTRICULAR FUNCTION (HCC) WRITTEN ON 01/09/2022  9:52 AM BY Willette Cluster, MD   Seen in the ED 12/25/21- No PE, mild pulmonary edema on CT, D/ced on increased lasix. Last echo february 2023 with EF 50-55%, mild RV systolic dysfunction, RA + LA mod dilated, no regurg of mechanical mitral valve, mild to mod tricuspid regurg, no aortic regurg or stenosis.Her dry weight is between 170-176 lbs. She presents today with 4 days of increased dyspnea and swelling in her legs. She ahs been taking lasix 40mg  BID and took 60 mg yesterday. On exam she has JVD to the earlobes, 2+ bilateral lower extremity pitting edema to the shins. Her lungs are clear without crackles or wheezes and there is no murmur on cardiac exam. Her vitals are wnl and she is saturating at 97% on room air. Her weight was at 181 lbs. Given her exam she seems to be volume overloaded from right heart failure. There is no concern for COPD exacerbation given clear lung exam. Since her vitals are stable this can be treated at home with close follow up next week. -Increase to Lasix 60mg  BID -Farxiga 10 mg daily -spiro 25 mg daily -toprol XL-25 daily -hydralazine 25 tid, imdur 30 daily -BMP and BNP -Will need cardiology follow up soon

## 2022-01-09 NOTE — Patient Instructions (Signed)
Thank you, Ms.Shaune Pascal for allowing [place] to provide your care today. Today we discussed :  Shortness of breath- The swelling in your neck veins, swelling in your legs and shortness of breath suggest heart failure exacerbation. You are still oxygenating well so we can treat this at home with an increased Lasix dose of 60 mg twice daily. If you start to feel worse and cannot breath please go to the emergency department. Otherwise, we will see you back in one week.  I have ordered the following labs for you:  Lab Orders         BMP8+Anion Gap         Brain natriuretic peptide       I have ordered the following medication/changed the following medications:   Increase Lasix dose to 60 mg BID for the next week    Follow up:  1 week     We look forward to seeing you next time. Please call our clinic at [phone] if you have any questions or concerns. The best time to call is Monday-Friday from 9am-4pm, but there is someone available 24/7. If after hours or the weekend, call the main hospital number and ask for the Internal Medicine Resident On-Call. If you need medication refills, please notify your pharmacy one week in advance and they will send [place] a request.   Thank you for trusting me with your care. Wishing you the best!   Iona Coach, MD [place]

## 2022-01-09 NOTE — Progress Notes (Signed)
Established Patient Office Visit  Subjective   Patient ID: Kim Anthony, female    DOB: [birth year]  Age: 71 y.o. [name]: [id number]  Chief Complaint  Patient presents with   Joint Swelling    Ankle swelling   Shortness of Breath   Referral    Eye Doctor    Ms. Scheid is a 71 y/o female with a pmh outlined below who presents for acute dyspnea. See A/P for HPI information.  Shortness of Breath Associated symptoms include chest pain, leg swelling, orthopnea, PND, sputum production and wheezing. Pertinent negatives include no fever.      Review of Systems  Constitutional:  Negative for chills and fever.  Respiratory:  Positive for cough, sputum production, shortness of breath and wheezing.   Cardiovascular:  Positive for chest pain, orthopnea, leg swelling and PND.  Neurological:  Negative for dizziness.      Objective:     BP 130/88 (BP Location: Left Arm, Patient Position: Sitting, Cuff Size: Normal)   Pulse 99   Temp 98.1 F (36.7 C) (Oral)   Ht '5\' 5"'$  (1.651 m)   Wt 181 lb 14.4 oz (82.5 kg)   SpO2 97%   BMI 30.27 kg/m    Physical Exam Constitutional:      General: She is not in acute distress.    Appearance: She is well-developed. She is obese. She is not ill-appearing.  Cardiovascular:     Rate and Rhythm: Regular rhythm. Tachycardia present.     Pulses: Normal pulses.     Heart sounds: Normal heart sounds. No murmur heard.    Comments: JVD to the earlobes bilaterally Pulmonary:     Breath sounds: Normal breath sounds. No decreased breath sounds, wheezing, rhonchi or rales.     Comments: Increased work of breathing Musculoskeletal:     Right lower leg: Edema present.     Left lower leg: Edema present.  Neurological:     Mental Status: She is alert.      No results found for any visits on 01/09/22.    The 10-year ASCVD risk score (Arnett DK, et al., 2019) is: 20.5%    Assessment & Plan:   Problem List Items Addressed This Visit        Cardiovascular and Mediastinum   Chronic combined systolic and diastolic heart failure ([place]) - Primary     Seen in the ED 12/25/21- No PE, mild pulmonary edema on CT, D/ced on increased lasix. Last echo february 2023 with EF 94-49%, mild RV systolic dysfunction, RA + LA mod dilated, no regurg of mechanical mitral valve, mild to mod tricuspid regurg, no aortic regurg or stenosis.Her dry weight is between 170-176 lbs. She presents today with 4 days of increased dyspnea and swelling in her legs. She ahs been taking lasix '40mg'$  BID and took 60 mg yesterday. On exam she has JVD to the earlobes, 2+ bilateral lower extremity pitting edema to the shins. Her lungs are clear without crackles or wheezes and there is no murmur on cardiac exam. Her vitals are wnl and she is saturating at 97% on room air. Her weight was at 181 lbs. Given her exam she seems to be volume overloaded from right heart failure. There is no concern for COPD exacerbation given clear lung exam. Since her vitals are stable this can be treated at home with close follow up next week. -Increase to Lasix '60mg'$  BID -Farxiga 10 mg daily -spiro 25 mg daily -toprol XL-25 daily -hydralazine 25 tid, imdur  30 daily -BMP and BNP -Will need cardiology follow up soon      Relevant Orders   BMP8+Anion Gap   Brain natriuretic peptide    No follow-ups on file.    Iona Coach, MD

## 2022-01-09 NOTE — Assessment & Plan Note (Signed)
Seen in the ED 12/25/21- No PE, mild pulmonary edema on CT, D/ced on increased lasix. Last echo february 2023 with EF 70-35%, mild RV systolic dysfunction, RA + LA mod dilated, no regurg of mechanical mitral valve, mild to mod tricuspid regurg, no aortic regurg or stenosis.Her dry weight is between 170-176 lbs. She presents today with 4 days of increased dyspnea and swelling in her legs. She ahs been taking lasix '40mg'$  BID and took 60 mg yesterday. On exam she has JVD to the earlobes, 2+ bilateral lower extremity pitting edema to the shins. Her lungs are clear without crackles or wheezes and there is no murmur on cardiac exam. Her vitals are wnl and she is saturating at 97% on room air. Her weight was at 181 lbs. Given her exam she seems to be volume overloaded from right heart failure. There is no concern for COPD exacerbation given clear lung exam. Since her vitals are stable this can be treated at home with close follow up next week. -Increase to Lasix '60mg'$  BID -Farxiga 10 mg daily -spiro 25 mg daily -toprol XL-25 daily -hydralazine 25 tid, imdur 30 daily -BMP and BNP -Will need cardiology follow up soon

## 2022-01-09 NOTE — Assessment & Plan Note (Signed)
>>  ASSESSMENT AND PLAN FOR CHRONIC HFREF (HEART FAILURE WITH REDUCED EJECTION FRACTION) (HCC) WRITTEN ON 09/14/2023 11:43 AM BY Javaeh Muscatello, DO   >>ASSESSMENT AND PLAN FOR CHF (CONGESTIVE HEART FAILURE) (HCC) WRITTEN ON 09/14/2023 11:43 AM BY Akelia Husted, DO   >>ASSESSMENT AND PLAN FOR BIVENTRICULAR HEART FAILURE WITH REDUCED LEFT VENTRICULAR FUNCTION (HCC) WRITTEN ON 01/09/2022  9:52 AM BY Willette Cluster, MD   Seen in the ED 12/25/21- No PE, mild pulmonary edema on CT, D/ced on increased lasix. Last echo february 2023 with EF 50-55%, mild RV systolic dysfunction, RA + LA mod dilated, no regurg of mechanical mitral valve, mild to mod tricuspid regurg, no aortic regurg or stenosis.Her dry weight is between 170-176 lbs. She presents today with 4 days of increased dyspnea and swelling in her legs. She ahs been taking lasix 40mg  BID and took 60 mg yesterday. On exam she has JVD to the earlobes, 2+ bilateral lower extremity pitting edema to the shins. Her lungs are clear without crackles or wheezes and there is no murmur on cardiac exam. Her vitals are wnl and she is saturating at 97% on room air. Her weight was at 181 lbs. Given her exam she seems to be volume overloaded from right heart failure. There is no concern for COPD exacerbation given clear lung exam. Since her vitals are stable this can be treated at home with close follow up next week. -Increase to Lasix 60mg  BID -Farxiga 10 mg daily -spiro 25 mg daily -toprol XL-25 daily -hydralazine 25 tid, imdur 30 daily -BMP and BNP -Will need cardiology follow up soon

## 2022-01-09 NOTE — Assessment & Plan Note (Signed)
>>  ASSESSMENT AND PLAN FOR HFREF (HEART FAILURE WITH REDUCED EJECTION FRACTION) (HCC) WRITTEN ON 09/14/2023 11:44 AM BY Tomeko Scoville, DO   >>ASSESSMENT AND PLAN FOR CHRONIC HFREF (HEART FAILURE WITH REDUCED EJECTION FRACTION) (HCC) WRITTEN ON 09/14/2023 11:43 AM BY Taejon Irani, DO   >>ASSESSMENT AND PLAN FOR CHF (CONGESTIVE HEART FAILURE) (HCC) WRITTEN ON 09/14/2023 11:43 AM BY Almeda Ezra, DO   >>ASSESSMENT AND PLAN FOR BIVENTRICULAR HEART FAILURE WITH REDUCED LEFT VENTRICULAR FUNCTION (HCC) WRITTEN ON 01/09/2022  9:52 AM BY Willette Cluster, MD   Seen in the ED 12/25/21- No PE, mild pulmonary edema on CT, D/ced on increased lasix. Last echo february 2023 with EF 50-55%, mild RV systolic dysfunction, RA + LA mod dilated, no regurg of mechanical mitral valve, mild to mod tricuspid regurg, no aortic regurg or stenosis.Her dry weight is between 170-176 lbs. She presents today with 4 days of increased dyspnea and swelling in her legs. She ahs been taking lasix 40mg  BID and took 60 mg yesterday. On exam she has JVD to the earlobes, 2+ bilateral lower extremity pitting edema to the shins. Her lungs are clear without crackles or wheezes and there is no murmur on cardiac exam. Her vitals are wnl and she is saturating at 97% on room air. Her weight was at 181 lbs. Given her exam she seems to be volume overloaded from right heart failure. There is no concern for COPD exacerbation given clear lung exam. Since her vitals are stable this can be treated at home with close follow up next week. -Increase to Lasix 60mg  BID -Farxiga 10 mg daily -spiro 25 mg daily -toprol XL-25 daily -hydralazine 25 tid, imdur 30 daily -BMP and BNP -Will need cardiology follow up soon

## 2022-01-10 LAB — BMP8+ANION GAP
Anion Gap: 18 mmol/L (ref 10.0–18.0)
BUN/Creatinine Ratio: 15 (ref 12–28)
BUN: 15 mg/dL (ref 8–27)
CO2: 18 mmol/L — ABNORMAL LOW (ref 20–29)
Calcium: 9.1 mg/dL (ref 8.7–10.3)
Chloride: 100 mmol/L (ref 96–106)
Creatinine, Ser: 0.97 mg/dL (ref 0.57–1.00)
Glucose: 106 mg/dL — ABNORMAL HIGH (ref 70–99)
Potassium: 3.7 mmol/L (ref 3.5–5.2)
Sodium: 136 mmol/L (ref 134–144)
eGFR: 62 mL/min/{1.73_m2} (ref >59)

## 2022-01-12 ENCOUNTER — Telehealth: Payer: Self-pay

## 2022-01-12 DIAGNOSIS — M25562 Pain in left knee: Secondary | ICD-10-CM | POA: Diagnosis not present

## 2022-01-12 NOTE — Progress Notes (Signed)
Internal Medicine Clinic Attending  I saw and evaluated the patient.  I personally confirmed the key portions of the history and exam documented by Dr. Rogers and I reviewed pertinent patient test results.  The assessment, diagnosis, and plan were formulated together and I agree with the documentation in the resident's note.  

## 2022-01-12 NOTE — Telephone Encounter (Signed)
Requesting lab results, please call pt back.  

## 2022-01-13 ENCOUNTER — Ambulatory Visit (INDEPENDENT_AMBULATORY_CARE_PROVIDER_SITE_OTHER): Payer: Medicare Other

## 2022-01-13 ENCOUNTER — Other Ambulatory Visit: Payer: Self-pay

## 2022-01-13 ENCOUNTER — Telehealth: Payer: Self-pay | Admitting: *Deleted

## 2022-01-13 ENCOUNTER — Ambulatory Visit: Payer: Medicare Other | Admitting: Pharmacist

## 2022-01-13 VITALS — BP 98/78 | HR 113 | Temp 98.0°F | Wt 179.2 lb

## 2022-01-13 DIAGNOSIS — Z7901 Long term (current) use of anticoagulants: Secondary | ICD-10-CM

## 2022-01-13 DIAGNOSIS — T7840XA Allergy, unspecified, initial encounter: Secondary | ICD-10-CM | POA: Diagnosis not present

## 2022-01-13 DIAGNOSIS — F17210 Nicotine dependence, cigarettes, uncomplicated: Secondary | ICD-10-CM

## 2022-01-13 DIAGNOSIS — F17200 Nicotine dependence, unspecified, uncomplicated: Secondary | ICD-10-CM

## 2022-01-13 DIAGNOSIS — E7800 Pure hypercholesterolemia, unspecified: Secondary | ICD-10-CM | POA: Diagnosis not present

## 2022-01-13 DIAGNOSIS — M1380 Other specified arthritis, unspecified site: Secondary | ICD-10-CM

## 2022-01-13 DIAGNOSIS — I10 Essential (primary) hypertension: Secondary | ICD-10-CM

## 2022-01-13 DIAGNOSIS — J449 Chronic obstructive pulmonary disease, unspecified: Secondary | ICD-10-CM

## 2022-01-13 DIAGNOSIS — I110 Hypertensive heart disease with heart failure: Secondary | ICD-10-CM | POA: Diagnosis not present

## 2022-01-13 DIAGNOSIS — R600 Localized edema: Secondary | ICD-10-CM

## 2022-01-13 DIAGNOSIS — M25569 Pain in unspecified knee: Secondary | ICD-10-CM

## 2022-01-13 DIAGNOSIS — I959 Hypotension, unspecified: Secondary | ICD-10-CM | POA: Diagnosis not present

## 2022-01-13 DIAGNOSIS — I5042 Chronic combined systolic (congestive) and diastolic (congestive) heart failure: Secondary | ICD-10-CM | POA: Diagnosis not present

## 2022-01-13 DIAGNOSIS — Z952 Presence of prosthetic heart valve: Secondary | ICD-10-CM | POA: Diagnosis not present

## 2022-01-13 DIAGNOSIS — I2782 Chronic pulmonary embolism: Secondary | ICD-10-CM

## 2022-01-13 LAB — PROTIME-INR
INR: 4.1 (ref 0.8–1.2)
Prothrombin Time: 39.8 seconds — ABNORMAL HIGH (ref 11.4–15.2)

## 2022-01-13 MED ORDER — FLUTICASONE PROPIONATE 50 MCG/ACT NA SUSP: 1.0000 | Freq: Every day | NASAL | 2 refills | Status: DC | PRN

## 2022-01-13 MED ORDER — ALBUTEROL SULFATE HFA 108 (90 BASE) MCG/ACT IN AERS: INHALATION_SPRAY | RESPIRATORY_TRACT | 0 refills | Status: DC

## 2022-01-13 MED ORDER — STIOLTO RESPIMAT 2.5-2.5 MCG/ACT IN AERS
INHALATION_SPRAY | RESPIRATORY_TRACT | 0 refills | Status: DC
Start: 2022-01-13 — End: 2022-01-14

## 2022-01-13 NOTE — Telephone Encounter (Signed)
Call from [place] with [facility] - INR this am was 5.3.  Pt had an appt w/[name] this morning.

## 2022-01-13 NOTE — Patient Instructions (Signed)
Patient instructed to take medications as defined in the Anti-coagulation Track section of this encounter.  Patient instructed to take today's dose.  Patient instructed to take one (1) tablet by mouth, once-daily. Repeat patient self-testing, finger-stick, point-of-care INR on Monday, August 7th, 2023 and communicate results to Pharmacist, Dr. Jorene Guest, PharmD, CPP. Patient verbalized understanding of these instructions.

## 2022-01-13 NOTE — Progress Notes (Signed)
 Established Patient Office Visit  Subjective   Patient ID: Kim Anthony, female    DOB: 12/18/1950  Age: 71 y.o. [name]: [id number]  Chief Complaint  Patient presents with   Edema    Both feet x 10 days    Kim Anthony is a 71 y/o female with a pmh outlined below who presents for 1 week follow up of shortness of breath and LE edema. Please see A/P for HPI.      Review of Systems  Constitutional:  Negative for chills and fever.  Eyes:  Negative for blurred vision and double vision.  Respiratory:  Positive for cough, sputum production and shortness of breath. Negative for wheezing.   Cardiovascular:  Positive for orthopnea and leg swelling. Negative for chest pain, palpitations, claudication and PND.  Neurological:  Negative for dizziness and headaches.      Objective:     BP 98/78 (BP Location: Right Arm, Patient Position: Sitting, Cuff Size: Normal)   Pulse (!) 113   Temp 98 F (36.7 C) (Oral)   Wt 179 lb 3.2 oz (81.3 kg)   SpO2 97%   BMI 29.82 kg/m    Physical Exam Constitutional:      Appearance: Normal appearance. She is obese.  Cardiovascular:     Rate and Rhythm: Regular rhythm. Tachycardia present.     Pulses: Normal pulses.     Heart sounds: Normal heart sounds. No murmur heard.    No gallop.     Comments: JVD to the earlobe in the setting of moderate TR Pulmonary:     Effort: Pulmonary effort is normal. No respiratory distress.     Breath sounds: Normal breath sounds. No wheezing or rales.  Musculoskeletal:     Comments: 2+ LE edema to the ankles, warmth and erythema of the feet with hyperesthesia, pulses not palpable due to swelling but ABIs normal   Skin:    General: Skin is warm and dry.     Capillary Refill: Capillary refill takes less than 2 seconds.  Neurological:     Mental Status: She is alert.      No results found for any visits on 01/13/22.    The 10-year ASCVD risk score (Arnett DK, et al., 2019) is: 13%    Assessment & Plan:    Problem List Items Addressed This Visit       Cardiovascular and Mediastinum   Chronic combined systolic and diastolic heart failure (HCC)    See A/P for Edema of bilateral lower extremities.      Relevant Orders   BMP8+Anion Gap   Essential hypertension   Relevant Orders   POCT ABI Screening Pilot No Charge   Hypotension    Patient found to have BP of 98/78 in the setting of increased lasix, found to have systolic to 110 after water. Will get a BMP to rule out AKI and will decrease lasix dose to 40 mg BID from 60 mg BID.      Relevant Orders   BMP8+Anion Gap     Respiratory   COPD (chronic obstructive pulmonary disease) (HCC)   Relevant Medications   Tiotropium Bromide-Olodaterol (STIOLTO RESPIMAT) 2.5-2.5 MCG/ACT AERS   albuterol (VENTOLIN HFA) 108 (90 Base) MCG/ACT inhaler   fluticasone (FLONASE) 50 MCG/ACT nasal spray     Musculoskeletal and Integument   Seronegative inflammatory arthritis    ESR,CRP,CBC ordered while getting other blood work for the visit per [facility] for workup of bilateral arthritis of the knee.          Established Patient Office Visit  Subjective   Patient ID: Kim Anthony, female    DOB: 04/21/1951  Age: 71 y.o. [name]: [id number]  Chief Complaint  Patient presents with   Edema    Both feet x 10 days    Kim Anthony is a 71 y/o female with a pmh outlined below who presents for 1 week follow up of shortness of breath and LE edema. Please see A/P for HPI.      Review of Systems  Constitutional:  Negative for chills and fever.  Eyes:  Negative for blurred vision and double vision.  Respiratory:  Positive for cough, sputum production and shortness of breath. Negative for wheezing.   Cardiovascular:  Positive for orthopnea and leg swelling. Negative for chest pain, palpitations, claudication and PND.  Neurological:  Negative for dizziness and headaches.      Objective:     BP 98/78 (BP Location: Right Arm, Patient Position: Sitting, Cuff Size: Normal)   Pulse (!) 113   Temp 98 F (36.7 C) (Oral)   Wt 179 lb 3.2 oz (81.3 kg)   SpO2 97%   BMI 29.82 kg/m    Physical Exam Constitutional:      Appearance: Normal appearance. She is obese.  Cardiovascular:     Rate and Rhythm: Regular rhythm. Tachycardia present.     Pulses: Normal pulses.     Heart sounds: Normal heart sounds. No murmur heard.    No gallop.     Comments: JVD to the earlobe in the setting of moderate TR Pulmonary:     Effort: Pulmonary effort is normal. No respiratory distress.     Breath sounds: Normal breath sounds. No wheezing or rales.  Musculoskeletal:     Comments: 2+ LE edema to the ankles, warmth and erythema of the feet with hyperesthesia, pulses not palpable due to swelling but ABIs normal   Skin:    General: Skin is warm and dry.     Capillary Refill: Capillary refill takes less than 2 seconds.  Neurological:     Mental Status: She is alert.      No results found for any visits on 01/13/22.    The 10-year ASCVD risk score (Arnett DK, et al., 2019) is: 13%    Assessment & Plan:    Problem List Items Addressed This Visit       Cardiovascular and Mediastinum   Chronic combined systolic and diastolic heart failure ([place])    See A/P for Edema of bilateral lower extremities.      Relevant Orders   BMP8+Anion Gap   Essential hypertension   Relevant Orders   POCT ABI Screening Pilot No Charge   Hypotension    Patient found to have BP of 98/78 in the setting of increased lasix, found to have systolic to 465 after water. Will get a BMP to rule out AKI and will decrease lasix dose to 40 mg BID from 60 mg BID.      Relevant Orders   BMP8+Anion Gap     Respiratory   COPD (chronic obstructive pulmonary disease) (HCC)   Relevant Medications   Tiotropium Bromide-Olodaterol (STIOLTO RESPIMAT) 2.5-2.5 MCG/ACT AERS   albuterol (VENTOLIN HFA) 108 (90 Base) MCG/ACT inhaler   fluticasone (FLONASE) 50 MCG/ACT nasal spray     Musculoskeletal and Integument   Seronegative inflammatory arthritis    ESR,CRP,CBC ordered while getting other blood work for the visit per [facility] for workup of bilateral arthritis of the knee.

## 2022-01-13 NOTE — Assessment & Plan Note (Signed)
>>  ASSESSMENT AND PLAN FOR BIVENTRICULAR HEART FAILURE WITH REDUCED LEFT VENTRICULAR FUNCTION (HCC) WRITTEN ON 01/13/2022 11:46 AM BY Willette Cluster, MD  See A/P for Edema of bilateral lower extremities.

## 2022-01-13 NOTE — Patient Instructions (Addendum)
Thank you, Kim Anthony for allowing [place] to provide your care today. Today we discussed :  SOB- Your lungs sound good and your oxygen saturation is good. You do not have any wheezing or poor air flow to suggest a COPD exacerbation and you do not have any fluid to suggest left sided heart failure. I think that some of this is chronic baseline shortness of breath.   Leg pain and swelling- Your legs look like they have improved and you have lost some weight since last visit due to your diuretic pill getting off excess fluid. I think some of the fluid in your legs may be from your heart but some of it is likely from the veins in your legs not working as well. Resting and elevating your legs should help with the swelling. Getting compression stockings and wearing them as much as possible should also help. You can use capsaicin cream or lidocaine cream for pain relief. You should reduce your lasix dose to 40 mg twice daily. Given that we increased the dose last time and you had low blood pressure we will check your kidney function and electrolytes today. You should follow up with cardiology soon given how often you are having increased swelling and shortness of breath.  Blood work- I have gotten the requested labs for your orthopedic doctor.  I have ordered the following labs for you:  Lab Orders         CBC no Diff         CRP (C-Reactive Protein)         Sed Rate (ESR)         BMP8+Anion Gap        I have ordered the following medication/changed the following medications:   Stop the following medications: Medications Discontinued During This Encounter  Medication Reason   fluticasone (FLONASE) 50 MCG/ACT nasal spray Reorder   STIOLTO RESPIMAT 2.5-2.5 MCG/ACT AERS Reorder   albuterol (VENTOLIN HFA) 108 (90 Base) MCG/ACT inhaler Reorder     Start the following medications: Meds ordered this encounter  Medications   Tiotropium Bromide-Olodaterol (STIOLTO RESPIMAT) 2.5-2.5 MCG/ACT AERS     Sig: INHALE 2 PUFFS BY MOUTH ONCE DAILY    Dispense:  4 g    Refill:  0   albuterol (VENTOLIN HFA) 108 (90 Base) MCG/ACT inhaler    Sig: INHALE 2 PUFFS BY MOUTH EVERY 6 HOURS AS NEEDED FOR WHEEZING FOR SHORTNESS OF BREATH    Dispense:  9 g    Refill:  0   fluticasone (FLONASE) 50 MCG/ACT nasal spray    Sig: Place 1 spray into both nostrils daily as needed for allergies.    Dispense:  9.9 mL    Refill:  2     Follow up: 3 months     We look forward to seeing you next time. Please call our clinic at [phone] if you have any questions or concerns. The best time to call is Monday-Friday from 9am-4pm, but there is someone available 24/7. If after hours or the weekend, call the main hospital number and ask for the Internal Medicine Resident On-Call. If you need medication refills, please notify your pharmacy one week in advance and they will send [place] a request.   Thank you for trusting me with your care. Wishing you the best!   Iona Coach, MD [place]

## 2022-01-13 NOTE — Assessment & Plan Note (Signed)
>>  ASSESSMENT AND PLAN FOR CHRONIC HFREF (HEART FAILURE WITH REDUCED EJECTION FRACTION) (HCC) WRITTEN ON 09/14/2023 11:43 AM BY Natisha Trzcinski, DO   >>ASSESSMENT AND PLAN FOR CHF (CONGESTIVE HEART FAILURE) (HCC) WRITTEN ON 09/14/2023 11:43 AM BY Shae Hinnenkamp, DO   >>ASSESSMENT AND PLAN FOR BIVENTRICULAR HEART FAILURE WITH REDUCED LEFT VENTRICULAR FUNCTION (HCC) WRITTEN ON 01/13/2022 11:46 AM BY Willette Cluster, MD  See A/P for Edema of bilateral lower extremities.

## 2022-01-13 NOTE — Assessment & Plan Note (Addendum)
See A/P for Edema of bilateral lower extremities.

## 2022-01-13 NOTE — Progress Notes (Deleted)
Combined systolic and diastolic heartfailure: Seen in the ED 12/25/21. No PE, mild pulmonary edema on CT. D/ced on increased lasix. Last echo february 2023 with EF 50-55%: RA + LA mod dilated, no regurg of mechanical mitral valve, mild to mod tricuspid regurg, no aortic regurg or stenosis.dry weight 176 lbs. Dr Haroldine Laws is her cardiologist. Last echo -Farxiga 10 mg daily -lasix 20 mg daily -spiro 25 mg daily -toprol XL-25 daily -hydralazine 25 tid, imdur 30 daily  Mitral regurg s/p mechanical valve replacement -warfarin 5 mg  COPD -albuterol -singulair(montelukast) 10 mg -stiolto respimate(tiotropium-olodaterol) 2.5-2.5 mcg  Thoracic ascending aortict. aneurysm 4.1x4 cm yearly CTA or MRA  Breast cancer axillary disection LUL radiation fibrosis  HTN -toprol '25mg'$ 

## 2022-01-13 NOTE — Assessment & Plan Note (Signed)
>>  ASSESSMENT AND PLAN FOR CHF (CONGESTIVE HEART FAILURE) (HCC) WRITTEN ON 09/14/2023 11:43 AM BY Nicky Milhouse, DO   >>ASSESSMENT AND PLAN FOR BIVENTRICULAR HEART FAILURE WITH REDUCED LEFT VENTRICULAR FUNCTION (HCC) WRITTEN ON 01/13/2022 11:46 AM BY Willette Cluster, MD  See A/P for Edema of bilateral lower extremities.

## 2022-01-13 NOTE — Assessment & Plan Note (Addendum)
Patient found to have BP of 98/78 in the setting of increased lasix, found to have systolic to 859 after water. Will get a BMP to rule out AKI and will decrease lasix dose to 40 mg BID from 60 mg BID.

## 2022-01-13 NOTE — Progress Notes (Signed)
Anticoagulation Management Kim Anthony is a 71 y.o. female who reports to the clinic for monitoring of warfarin treatment.    Indication:  History of mechanical heart valve placement; History of pulmonary embolism; long term current use of oral anticoagulant, warfarin with target INR 2.5 - 3.5; Patient performs in-home, patient self testing via fingerstick, point of care device.   Duration: indefinite Supervising physician: Gilles Chiquito  Anticoagulation Clinic Visit History: Patient does not report signs/symptoms of bleeding or thromboembolism  Other recent changes: No diet, medications, lifestyle changes reported during her anticoagulation management visit today.  Anticoagulation Episode Summary     Current INR goal:  2.5-3.5  TTR:  27.9 % (5.9 y)  Next INR check:  01/26/2022  INR from last check:  4.1 (01/13/2022)  Weekly max warfarin dose:    Target end date:    INR check location:  Anticoagulation Clinic  Preferred lab:    Send INR reminders to:     Indications   Pulmonary embolism (HCC) [I26.99]        Comments:           Allergies  Allergen Reactions   Lisinopril Swelling   Acetaminophen-Codeine Itching   Propoxyphene Itching    Darvocet   Tape Rash    Plastic   Tramadol Itching, Nausea And Vomiting and Rash    Current Outpatient Medications:    albuterol (PROVENTIL) (2.5 MG/3ML) 0.083% nebulizer solution, USE 1 VIAL IN NEBULIZER EVERY 6 HOURS AS NEEDED FOR WHEEZING FOR SHORTNESS OF BREATH, Disp: 90 mL, Rfl: 2   albuterol (VENTOLIN HFA) 108 (90 Base) MCG/ACT inhaler, INHALE 2 PUFFS BY MOUTH EVERY 6 HOURS AS NEEDED FOR WHEEZING FOR SHORTNESS OF BREATH, Disp: 9 g, Rfl: 0   dapagliflozin propanediol (FARXIGA) 10 MG TABS tablet, Take 1 tablet (10 mg total) by mouth daily., Disp: 90 tablet, Rfl: 2   docusate sodium (COLACE) 100 MG capsule, Take 1 capsule (100 mg total) by mouth 2 (two) times daily as needed for mild constipation., Disp: 30 capsule, Rfl: 1   famotidine  (PEPCID) 20 MG tablet, Take 1 tablet by mouth twice daily, Disp: 60 tablet, Rfl: 0   fluticasone (FLONASE) 50 MCG/ACT nasal spray, Place 1 spray into both nostrils daily as needed for allergies., Disp: 9.9 mL, Rfl: 2   gabapentin (NEURONTIN) 600 MG tablet, TAKE 1 & 1/2 (ONE & ONE-HALF) TABLETS BY MOUTH THREE TIMES DAILY FOR  NERVE  PAIN, Disp: 150 tablet, Rfl: 5   hydrALAZINE (APRESOLINE) 25 MG tablet, Take 1 tablet (25 mg total) by mouth 3 (three) times daily., Disp: 90 tablet, Rfl: 11   HYDROcodone-acetaminophen (NORCO) 5-325 MG tablet, Take 1 tablet by mouth every 4 (four) hours as needed for moderate pain., Disp: 150 tablet, Rfl: 0   isosorbide mononitrate (IMDUR) 30 MG 24 hr tablet, Take 1 tablet (30 mg total) by mouth daily., Disp: 90 tablet, Rfl: 2   loratadine (CLARITIN) 10 MG tablet, Take 1 tablet (10 mg total) by mouth daily., Disp: 90 tablet, Rfl: 0   metoprolol succinate (TOPROL-XL) 25 MG 24 hr tablet, Take 1 tablet (25 mg total) by mouth daily., Disp: 90 tablet, Rfl: 2   montelukast (SINGULAIR) 10 MG tablet, Take 1 tablet (10 mg total) by mouth at bedtime., Disp: 90 tablet, Rfl: 2   nicotine (NICODERM CQ - DOSED IN MG/24 HOURS) 21 mg/24hr patch, Place 1 patch (21 mg total) onto the skin daily., Disp: 30 patch, Rfl: 2   polyethylene glycol (MIRALAX / GLYCOLAX)  17 g packet, Take 17 g by mouth daily., Disp: 14 each, Rfl: 0   potassium chloride SA (KLOR-CON M) 20 MEQ tablet, Take 2 tablets (40 mEq total) by mouth daily., Disp: 30 tablet, Rfl: 6   simvastatin (ZOCOR) 20 MG tablet, Take 1 tablet (20 mg total) by mouth daily., Disp: 90 tablet, Rfl: 2   Spacer/Aero-Holding Chambers (BREATHERITE COLL SPACER ADULT) MISC, 1 applicator by Does not apply route daily., Disp: 1 each, Rfl: 0   spironolactone (ALDACTONE) 25 MG tablet, Take 1 tablet by mouth once daily, Disp: 90 tablet, Rfl: 3   Tiotropium Bromide-Olodaterol (STIOLTO RESPIMAT) 2.5-2.5 MCG/ACT AERS, INHALE 2 PUFFS BY MOUTH ONCE DAILY, Disp:  4 g, Rfl: 0   warfarin (COUMADIN) 5 MG tablet, TAKE 1& 1/2  TABLETS BY MOUTH ONCE DAILY AT 4 PM., Disp: 42 tablet, Rfl: 1   diphenhydrAMINE (BENADRYL) 25 MG tablet, Take 75 mg by mouth daily as needed for allergies. (Patient not taking: Reported on 08/11/2021), Disp: , Rfl:    enoxaparin (LOVENOX) 80 MG/0.8ML injection, Inject 0.8 mLs (80 mg total) into the skin every 12 (twelve) hours. Start 24 hours after your surgery. (Patient not taking: Reported on 08/18/2021), Disp: 8 mL, Rfl: 1   furosemide (LASIX) 40 MG tablet, Take 1 tablet (40 mg total) by mouth 2 (two) times daily for 5 days. Then return to your normal dose of 1 tablet daily, Disp: 45 tablet, Rfl: 3 Past Medical History:  Diagnosis Date   Arthritis    Asthma    Breast cancer ([place]) 2001   Left Breast Cancer   CHF (congestive heart failure) (HCC)    COPD (chronic obstructive pulmonary disease) (HCC)    Coronary artery disease    GERD (gastroesophageal reflux disease)    History of mitral valve replacement with mechanical valve    HLD (hyperlipidemia)    Hypertension    Pulmonary embolism ([place]) 2021   Social History   Socioeconomic History   Marital status: Single    Spouse name: Not on file   Number of children: Not on file   Years of education: Not on file   Highest education level: Not on file  Occupational History   Not on file  Tobacco Use   Smoking status: Every Day    Packs/day: 0.10    Years: 30.00    Total pack years: 3.00    Types: Cigarettes   Smokeless tobacco: Never   Tobacco comments:    5 cigs per day  Vaping Use   Vaping Use: Never used  Substance and Sexual Activity   Alcohol use: Yes    Alcohol/week: 3.0 standard drinks of alcohol    Types: 3 Standard drinks or equivalent per week    Comment: beer- ocasional   Drug use: No   Sexual activity: Not Currently    Birth control/protection: None  Other Topics Concern   Not on file  Social History Narrative   ** Merged History Encounter **        Social Determinants of Health   Financial Resource Strain: Medium Risk (02/27/2021)   Overall Financial Resource Strain (CARDIA)    Difficulty of Paying Living Expenses: Somewhat hard  Food Insecurity: Food Insecurity Present (01/08/2021)   Hunger Vital Sign    Worried About Running Out of Food in the Last Year: Never true    Ran Out of Food in the Last Year: Sometimes true  Transportation Needs: No Transportation Needs (01/08/2021)   [place] - Transportation  Lack of Transportation (Medical): No    Lack of Transportation (Non-Medical): No  Physical Activity: Not on file  Stress: Not on file  Social Connections: Socially Isolated (11/28/2019)   Social Connection and Isolation Panel [NHANES]    Frequency of Communication with Friends and Family: More than three times a week    Frequency of Social Gatherings with Friends and Family: More than three times a week    Attends Religious Services: Never    Marine scientist or Organizations: No    Attends Music therapist: Not on file    Marital Status: Never married   Family History  Problem Relation Age of Onset   Hypertension Mother    Cancer Father    Hypertension Father    Breast cancer Maternal Aunt 50   Heart attack Other     ASSESSMENT Recent Results: The most recent result is correlated with 40 mg per week: Lab Results  Component Value Date   INR 4.1 (HH) 01/13/2022   INR 2.1 08/18/2021   INR 1.2 (A) 08/11/2021    Anticoagulation Dosing: Description   Take one (1) tablet by mouth, once-daily. Repeat patient self-testing, finger-stick, point-of-care INR on Monday, August 7th, 2023 and communicate results to Pharmacist, Dr. Jorene Guest, PharmD, CPP.     INR today: Supratherapeutic  PLAN Weekly dose was decreased by 12% to 35 mg per week  Patient Instructions  Patient instructed to take medications as defined in the Anti-coagulation Track section of this encounter.  Patient instructed to take  today's dose.  Patient instructed to take one (1) tablet by mouth, once-daily. Repeat patient self-testing, finger-stick, point-of-care INR on Monday, August 7th, 2023 and communicate results to Pharmacist, Dr. Jorene Guest, PharmD, CPP. Patient verbalized understanding of these instructions.   Patient advised to contact clinic or seek medical attention if signs/symptoms of bleeding or thromboembolism occur.  Patient verbalized understanding by repeating back information and was advised to contact me if further medication-related questions arise. Patient was also provided an information handout.  Follow-up Return in 13 days (on 01/26/2022) for Follow up INR.Patient will be performing patient self-testing and will make results available to me for dose adjustment.   Pennie Banter, PharmD, CPP  15 minutes spent face-to-face with the patient during the encounter. 50% of time spent on education, including signs/sx bleeding and clotting, as well as food and drug interactions with warfarin. 50% of time was spent on fingerprick POC INR sample collection,processing, results determination, and documentation in [email or web address]

## 2022-01-13 NOTE — Telephone Encounter (Signed)
Per [name]  "Thank you Alberto Schoch. I am aware. Just saw her with Olivia Mackie in Lab. She has had a confirmatory venous drawn sample collected and sent to main lab for processing. I communicated with her daughter (and the patient) and advised that when this laboratory test (PT/INR) from the lab returns, I will provide dosing instructions based upon the LAB value (which typically comes back lower than the point of care device results that mdINR is reporting"

## 2022-01-13 NOTE — Assessment & Plan Note (Addendum)
Patient returned today for 1 week follow up of SOB and Lower extremity edema with clear lung exam and stable vitals in the setting of known heart failure and a BNP of 230.3, consistent with right heart failure exacerbation. Today patient has persistent SOB. Patient continues to have JVD to the earlobe, which is difficult to interpret in the setting of moderate tricuspid regurg. She has 2+ bilateral edema to the ankles improved from 2+ pitting edema to the mid shins. She also has 2 lb weight loss since her last visit with increased lasix dose from '40mg'$  daily to 60 mg BID.  She was hypotensive to 98/78 with tachycardia to 113 in the setting of increased lasix dose, but denies lightheadedness, dizziness,changes in vision, CP.She says that her feet are painful and continue to swell when she is on them for long periods of time. The swelling improves when she elevates her legs. ABIs in clinic were 1.18 L 1.14 R, indicating no PAD and adequate blood flow to the legs. She says she has no history of blood clots, although I do think that the swelling is no longer because of the heart, but likely due to venous incompetence given the pattern.  -Lasix 40 mg BID -Directed to medical supplies for compression stockings -Can use lidocaine or capsaicin gel for pain -Will need cardiology follow up

## 2022-01-13 NOTE — Assessment & Plan Note (Signed)
ESR,CRP,CBC ordered while getting other blood work for the visit per [facility] for workup of bilateral arthritis of the knee.

## 2022-01-13 NOTE — Assessment & Plan Note (Signed)
>>  ASSESSMENT AND PLAN FOR HFREF (HEART FAILURE WITH REDUCED EJECTION FRACTION) (HCC) WRITTEN ON 09/14/2023 11:44 AM BY Ifeanyi Mickelson, DO   >>ASSESSMENT AND PLAN FOR CHRONIC HFREF (HEART FAILURE WITH REDUCED EJECTION FRACTION) (HCC) WRITTEN ON 09/14/2023 11:43 AM BY Gabryelle Whitmoyer, DO   >>ASSESSMENT AND PLAN FOR CHF (CONGESTIVE HEART FAILURE) (HCC) WRITTEN ON 09/14/2023 11:43 AM BY Amarion Portell, DO   >>ASSESSMENT AND PLAN FOR BIVENTRICULAR HEART FAILURE WITH REDUCED LEFT VENTRICULAR FUNCTION (HCC) WRITTEN ON 01/13/2022 11:46 AM BY Willette Cluster, MD  See A/P for Edema of bilateral lower extremities.

## 2022-01-14 ENCOUNTER — Telehealth: Payer: Self-pay

## 2022-01-14 ENCOUNTER — Other Ambulatory Visit: Payer: Self-pay | Admitting: Internal Medicine

## 2022-01-14 DIAGNOSIS — J45909 Unspecified asthma, uncomplicated: Secondary | ICD-10-CM

## 2022-01-14 LAB — CBC
Hematocrit: 38.8 % (ref 34.0–46.6)
Hemoglobin: 12.8 g/dL (ref 11.1–15.9)
MCH: 31.5 pg (ref 26.6–33.0)
MCHC: 33 g/dL (ref 31.5–35.7)
MCV: 96 fL (ref 79–97)
Platelets: 427 10*3/uL (ref 150–450)
RBC: 4.06 x10E6/uL (ref 3.77–5.28)
RDW: 11.4 % — ABNORMAL LOW (ref 11.7–15.4)
WBC: 7.1 10*3/uL (ref 3.4–10.8)

## 2022-01-14 LAB — BMP8+ANION GAP
Anion Gap: 20 mmol/L — ABNORMAL HIGH (ref 10.0–18.0)
BUN/Creatinine Ratio: 15 (ref 12–28)
BUN: 15 mg/dL (ref 8–27)
CO2: 20 mmol/L (ref 20–29)
Calcium: 9.2 mg/dL (ref 8.7–10.3)
Chloride: 96 mmol/L (ref 96–106)
Creatinine, Ser: 0.97 mg/dL (ref 0.57–1.00)
Glucose: 89 mg/dL (ref 70–99)
Potassium: 3.9 mmol/L (ref 3.5–5.2)
Sodium: 136 mmol/L (ref 134–144)
eGFR: 62 mL/min/{1.73_m2} (ref >59)

## 2022-01-14 LAB — SEDIMENTATION RATE: Sed Rate: 111 mm/hr — ABNORMAL HIGH (ref 0–40)

## 2022-01-14 LAB — C-REACTIVE PROTEIN: CRP: 46 mg/L — ABNORMAL HIGH (ref 0–10)

## 2022-01-14 MED ORDER — ALBUTEROL SULFATE HFA 108 (90 BASE) MCG/ACT IN AERS: 2.0000 | INHALATION_SPRAY | Freq: Four times a day (QID) | RESPIRATORY_TRACT | 2 refills | Status: DC | PRN

## 2022-01-14 MED ORDER — STIOLTO RESPIMAT 2.5-2.5 MCG/ACT IN AERS: INHALATION_SPRAY | RESPIRATORY_TRACT | 0 refills | Status: DC

## 2022-01-14 MED ORDER — FLUTICASONE PROPIONATE 50 MCG/ACT NA SUSP: 1.0000 | Freq: Every day | NASAL | 2 refills | Status: DC | PRN

## 2022-01-14 NOTE — Addendum Note (Signed)
Addended by: Iona Coach on: 01/14/2022 07:41 AM   Modules accepted: Orders

## 2022-01-14 NOTE — Addendum Note (Signed)
Addended by: Edwyna Perfect on: 01/14/2022 08:31 AM   Modules accepted: Orders

## 2022-01-19 DIAGNOSIS — Z7901 Long term (current) use of anticoagulants: Secondary | ICD-10-CM | POA: Diagnosis not present

## 2022-01-19 DIAGNOSIS — I2782 Chronic pulmonary embolism: Secondary | ICD-10-CM | POA: Diagnosis not present

## 2022-01-19 DIAGNOSIS — Z952 Presence of prosthetic heart valve: Secondary | ICD-10-CM | POA: Diagnosis not present

## 2022-01-19 NOTE — Progress Notes (Signed)
Evaluation and management procedures were performed by the Clinical Pharmacy Practitioner under my supervision and collaboration. I have reviewed the Practitioner's note and chart, and I agree with the management and plan as documented above. ° °

## 2022-01-19 NOTE — Progress Notes (Signed)
Internal Medicine Clinic Attending  Case discussed with Dr. Rogers  at the time of the visit.  We reviewed the resident's history and exam and pertinent patient test results.  I agree with the assessment, diagnosis, and plan of care documented in the resident's note.  

## 2022-01-21 ENCOUNTER — Telehealth: Payer: Self-pay

## 2022-01-21 NOTE — Telephone Encounter (Signed)
Patient is calling for a refill on Hydrocodone

## 2022-01-22 MED ORDER — HYDROCODONE-ACETAMINOPHEN 5-325 MG PO TABS: 1.0000 | ORAL_TABLET | ORAL | 0 refills | Status: DC | PRN

## 2022-01-22 NOTE — Addendum Note (Signed)
Addended by: Courtney Heys on: 01/22/2022 12:13 PM   Modules accepted: Orders

## 2022-02-02 ENCOUNTER — Encounter (HOSPITAL_COMMUNITY): Payer: Medicare Other

## 2022-02-02 DIAGNOSIS — M79661 Pain in right lower leg: Secondary | ICD-10-CM | POA: Diagnosis not present

## 2022-02-02 DIAGNOSIS — M25562 Pain in left knee: Secondary | ICD-10-CM | POA: Diagnosis not present

## 2022-02-02 DIAGNOSIS — M79604 Pain in right leg: Secondary | ICD-10-CM | POA: Diagnosis not present

## 2022-02-02 DIAGNOSIS — M79605 Pain in left leg: Secondary | ICD-10-CM | POA: Diagnosis not present

## 2022-02-02 DIAGNOSIS — M79662 Pain in left lower leg: Secondary | ICD-10-CM | POA: Diagnosis not present

## 2022-02-03 ENCOUNTER — Ambulatory Visit (HOSPITAL_COMMUNITY): Admission: RE | Admit: 2022-02-03 | Payer: Medicare Other | Source: Ambulatory Visit

## 2022-02-04 ENCOUNTER — Emergency Department (HOSPITAL_COMMUNITY): Payer: Medicare Other

## 2022-02-04 ENCOUNTER — Emergency Department (HOSPITAL_COMMUNITY)
Admission: EM | Admit: 2022-02-04 | Discharge: 2022-02-04 | Discharge status: Against Medical Advice | Payer: Medicare Other | Attending: Vascular Surgery | Admitting: Vascular Surgery

## 2022-02-04 ENCOUNTER — Other Ambulatory Visit: Payer: Self-pay

## 2022-02-04 ENCOUNTER — Encounter (HOSPITAL_COMMUNITY): Payer: Self-pay | Admitting: Emergency Medicine

## 2022-02-04 DIAGNOSIS — R0602 Shortness of breath: Secondary | ICD-10-CM | POA: Diagnosis not present

## 2022-02-04 DIAGNOSIS — I509 Heart failure, unspecified: Secondary | ICD-10-CM | POA: Diagnosis not present

## 2022-02-04 DIAGNOSIS — J449 Chronic obstructive pulmonary disease, unspecified: Secondary | ICD-10-CM | POA: Diagnosis not present

## 2022-02-04 DIAGNOSIS — Z7951 Long term (current) use of inhaled steroids: Secondary | ICD-10-CM | POA: Diagnosis not present

## 2022-02-04 DIAGNOSIS — Z5321 Procedure and treatment not carried out due to patient leaving prior to being seen by health care provider: Secondary | ICD-10-CM | POA: Insufficient documentation

## 2022-02-04 DIAGNOSIS — C50912 Malignant neoplasm of unspecified site of left female breast: Secondary | ICD-10-CM | POA: Diagnosis not present

## 2022-02-04 DIAGNOSIS — Z7901 Long term (current) use of anticoagulants: Secondary | ICD-10-CM | POA: Insufficient documentation

## 2022-02-04 DIAGNOSIS — J811 Chronic pulmonary edema: Secondary | ICD-10-CM | POA: Diagnosis not present

## 2022-02-04 LAB — COMPREHENSIVE METABOLIC PANEL
ALT: 18 U/L (ref 0–44)
AST: 48 U/L — ABNORMAL HIGH (ref 15–41)
Albumin: 3.1 g/dL — ABNORMAL LOW (ref 3.5–5.0)
Alkaline Phosphatase: 135 U/L — ABNORMAL HIGH (ref 38–126)
Anion gap: 13 (ref 5–15)
BUN: 21 mg/dL (ref 8–23)
CO2: 18 mmol/L — ABNORMAL LOW (ref 22–32)
Calcium: 9.2 mg/dL (ref 8.9–10.3)
Chloride: 107 mmol/L (ref 98–111)
Creatinine, Ser: 1.32 mg/dL — ABNORMAL HIGH (ref 0.44–1.00)
GFR, Estimated: 43 mL/min — ABNORMAL LOW (ref >60)
Glucose, Bld: 93 mg/dL (ref 70–99)
Potassium: 3.4 mmol/L — ABNORMAL LOW (ref 3.5–5.1)
Sodium: 138 mmol/L (ref 135–145)
Total Bilirubin: 1.3 mg/dL — ABNORMAL HIGH (ref 0.3–1.2)
Total Protein: 7.4 g/dL (ref 6.5–8.1)

## 2022-02-04 LAB — CBC
HCT: 41.6 % (ref 36.0–46.0)
Hemoglobin: 12.8 g/dL (ref 12.0–15.0)
MCH: 30.8 pg (ref 26.0–34.0)
MCHC: 30.8 g/dL (ref 30.0–36.0)
MCV: 100.2 fL — ABNORMAL HIGH (ref 80.0–100.0)
Platelets: 448 10*3/uL — ABNORMAL HIGH (ref 150–400)
RBC: 4.15 MIL/uL (ref 3.87–5.11)
RDW: 13.4 % (ref 11.5–15.5)
WBC: 7.8 10*3/uL (ref 4.0–10.5)
nRBC: 0 % (ref 0.0–0.2)

## 2022-02-04 LAB — TROPONIN I (HIGH SENSITIVITY): Troponin I (High Sensitivity): 15 ng/L (ref <18)

## 2022-02-04 LAB — BRAIN NATRIURETIC PEPTIDE: B Natriuretic Peptide: 406.1 pg/mL — ABNORMAL HIGH (ref 0.0–100.0)

## 2022-02-04 NOTE — ED Notes (Signed)
Pt called 4x no answer

## 2022-02-04 NOTE — ED Provider Triage Note (Signed)
Emergency Medicine Provider Triage Evaluation Note  Kim Anthony , a 71 y.o. female  was evaluated in triage.  Pt complains of shortness of breath. She states that same has been ongoing for the past 2 days. Has tried her home inhalers without relief. Hx of COPD, CHF, and PE compliant with her home inhalers, Lasix, and Warfarin. States she feels like she is retaining fluid in her abdomen. Denies any fevers, chills, or chest pain.  Review of Systems  Positive:  Negative:   Physical Exam  BP 115/88 (BP Location: Right Arm)   Pulse (!) 113   Temp 98.5 F (36.9 C) (Oral)   Resp 20   SpO2 93%  Gen:   Awake, no distress   Resp:  Normal effort MSK:   Moves extremities without difficulty  Other:  Mild abdominal distention  Medical Decision Making  Medically screening exam initiated at 9:26 AM.  Appropriate orders placed.  Kim Anthony was informed that the remainder of the evaluation will be completed by another provider, this initial triage assessment does not replace that evaluation, and the importance of remaining in the ED until their evaluation is complete.     Bud Face, PA-C 02/04/22 [phone]

## 2022-02-04 NOTE — ED Notes (Signed)
Pt called 2x no answer 

## 2022-02-04 NOTE — ED Triage Notes (Signed)
Pt with COPD and CHF hx also hx of PE in the past. Compliant with blood thinners per pt.  Increasing shob x 2 days.

## 2022-02-05 ENCOUNTER — Other Ambulatory Visit (HOSPITAL_COMMUNITY): Payer: Self-pay | Admitting: Orthopedic Surgery

## 2022-02-05 ENCOUNTER — Ambulatory Visit (INDEPENDENT_AMBULATORY_CARE_PROVIDER_SITE_OTHER)
Admission: RE | Admit: 2022-02-05 | Discharge: 2022-02-05 | Disposition: A | Discharge status: Home / Self Care | Payer: Medicare Other | Source: Ambulatory Visit | Attending: Vascular Surgery | Admitting: Vascular Surgery

## 2022-02-05 DIAGNOSIS — R600 Localized edema: Secondary | ICD-10-CM

## 2022-02-05 DIAGNOSIS — R0602 Shortness of breath: Secondary | ICD-10-CM | POA: Diagnosis not present

## 2022-02-07 ENCOUNTER — Other Ambulatory Visit: Payer: Self-pay | Admitting: Internal Medicine

## 2022-02-07 DIAGNOSIS — I5042 Chronic combined systolic (congestive) and diastolic (congestive) heart failure: Secondary | ICD-10-CM

## 2022-02-08 ENCOUNTER — Telehealth: Payer: Self-pay | Admitting: Pharmacist

## 2022-02-08 NOTE — Telephone Encounter (Signed)
Patient's daughter texted me results of PFS FS POC INR just performed. INR = 5.9 (target 2.5 - 3.5) this INR in response to '35mg'$  warfarin/wk. Will OMIT tonights dose. Resume tomorrow, Monday 21-AUG-23 with only 2.'5mg'$  (1/2 of her '5mg'$  strength warfarin tablet); '5mg'$  on Tuesday, and Wednesday. REPEAT INR on Thursday, 24-AUG-23.

## 2022-02-18 ENCOUNTER — Inpatient Hospital Stay (HOSPITAL_COMMUNITY)
Admission: EM | Admit: 2022-02-18 | Discharge: 2022-02-26 | DRG: 291 | Disposition: A | Discharge status: Home / Self Care | Payer: Medicare Other | Attending: Internal Medicine | Admitting: Internal Medicine

## 2022-02-18 ENCOUNTER — Encounter (HOSPITAL_COMMUNITY): Payer: Self-pay | Admitting: Internal Medicine

## 2022-02-18 ENCOUNTER — Emergency Department (HOSPITAL_COMMUNITY): Payer: Medicare Other

## 2022-02-18 ENCOUNTER — Observation Stay (HOSPITAL_COMMUNITY): Payer: Medicare Other

## 2022-02-18 ENCOUNTER — Observation Stay (HOSPITAL_BASED_OUTPATIENT_CLINIC_OR_DEPARTMENT_OTHER): Payer: Medicare Other

## 2022-02-18 ENCOUNTER — Other Ambulatory Visit: Payer: Self-pay

## 2022-02-18 DIAGNOSIS — R9431 Abnormal electrocardiogram [ECG] [EKG]: Secondary | ICD-10-CM | POA: Diagnosis not present

## 2022-02-18 DIAGNOSIS — R410 Disorientation, unspecified: Secondary | ICD-10-CM | POA: Diagnosis not present

## 2022-02-18 DIAGNOSIS — Z79899 Other long term (current) drug therapy: Secondary | ICD-10-CM

## 2022-02-18 DIAGNOSIS — I472 Ventricular tachycardia, unspecified: Secondary | ICD-10-CM | POA: Diagnosis present

## 2022-02-18 DIAGNOSIS — G8929 Other chronic pain: Secondary | ICD-10-CM | POA: Diagnosis present

## 2022-02-18 DIAGNOSIS — I272 Pulmonary hypertension, unspecified: Secondary | ICD-10-CM | POA: Diagnosis present

## 2022-02-18 DIAGNOSIS — I252 Old myocardial infarction: Secondary | ICD-10-CM | POA: Diagnosis not present

## 2022-02-18 DIAGNOSIS — I5040 Unspecified combined systolic (congestive) and diastolic (congestive) heart failure: Secondary | ICD-10-CM

## 2022-02-18 DIAGNOSIS — E669 Obesity, unspecified: Secondary | ICD-10-CM | POA: Diagnosis present

## 2022-02-18 DIAGNOSIS — M545 Low back pain, unspecified: Secondary | ICD-10-CM | POA: Diagnosis not present

## 2022-02-18 DIAGNOSIS — I484 Atypical atrial flutter: Secondary | ICD-10-CM

## 2022-02-18 DIAGNOSIS — I5042 Chronic combined systolic (congestive) and diastolic (congestive) heart failure: Secondary | ICD-10-CM

## 2022-02-18 DIAGNOSIS — I7121 Aneurysm of the ascending aorta, without rupture: Secondary | ICD-10-CM | POA: Diagnosis present

## 2022-02-18 DIAGNOSIS — R0689 Other abnormalities of breathing: Secondary | ICD-10-CM | POA: Diagnosis not present

## 2022-02-18 DIAGNOSIS — F17210 Nicotine dependence, cigarettes, uncomplicated: Secondary | ICD-10-CM | POA: Diagnosis present

## 2022-02-18 DIAGNOSIS — Z91048 Other nonmedicinal substance allergy status: Secondary | ICD-10-CM

## 2022-02-18 DIAGNOSIS — I361 Nonrheumatic tricuspid (valve) insufficiency: Secondary | ICD-10-CM

## 2022-02-18 DIAGNOSIS — Z923 Personal history of irradiation: Secondary | ICD-10-CM

## 2022-02-18 DIAGNOSIS — I499 Cardiac arrhythmia, unspecified: Secondary | ICD-10-CM | POA: Diagnosis not present

## 2022-02-18 DIAGNOSIS — Z716 Tobacco abuse counseling: Secondary | ICD-10-CM

## 2022-02-18 DIAGNOSIS — I2510 Atherosclerotic heart disease of native coronary artery without angina pectoris: Secondary | ICD-10-CM | POA: Diagnosis not present

## 2022-02-18 DIAGNOSIS — Z853 Personal history of malignant neoplasm of breast: Secondary | ICD-10-CM

## 2022-02-18 DIAGNOSIS — Z885 Allergy status to narcotic agent status: Secondary | ICD-10-CM

## 2022-02-18 DIAGNOSIS — Z888 Allergy status to other drugs, medicaments and biological substances status: Secondary | ICD-10-CM

## 2022-02-18 DIAGNOSIS — R0603 Acute respiratory distress: Secondary | ICD-10-CM | POA: Diagnosis not present

## 2022-02-18 DIAGNOSIS — R062 Wheezing: Secondary | ICD-10-CM | POA: Diagnosis not present

## 2022-02-18 DIAGNOSIS — Z951 Presence of aortocoronary bypass graft: Secondary | ICD-10-CM

## 2022-02-18 DIAGNOSIS — J810 Acute pulmonary edema: Secondary | ICD-10-CM | POA: Diagnosis not present

## 2022-02-18 DIAGNOSIS — Z7901 Long term (current) use of anticoagulants: Secondary | ICD-10-CM

## 2022-02-18 DIAGNOSIS — Z20822 Contact with and (suspected) exposure to covid-19: Secondary | ICD-10-CM | POA: Diagnosis not present

## 2022-02-18 DIAGNOSIS — R0602 Shortness of breath: Secondary | ICD-10-CM | POA: Diagnosis not present

## 2022-02-18 DIAGNOSIS — J449 Chronic obstructive pulmonary disease, unspecified: Secondary | ICD-10-CM | POA: Diagnosis not present

## 2022-02-18 DIAGNOSIS — I2582 Chronic total occlusion of coronary artery: Secondary | ICD-10-CM | POA: Diagnosis present

## 2022-02-18 DIAGNOSIS — I4892 Unspecified atrial flutter: Secondary | ICD-10-CM | POA: Diagnosis not present

## 2022-02-18 DIAGNOSIS — I5020 Unspecified systolic (congestive) heart failure: Secondary | ICD-10-CM | POA: Diagnosis present

## 2022-02-18 DIAGNOSIS — Z6827 Body mass index (BMI) 27.0-27.9, adult: Secondary | ICD-10-CM

## 2022-02-18 DIAGNOSIS — I509 Heart failure, unspecified: Secondary | ICD-10-CM

## 2022-02-18 DIAGNOSIS — M1712 Unilateral primary osteoarthritis, left knee: Secondary | ICD-10-CM | POA: Diagnosis present

## 2022-02-18 DIAGNOSIS — F17200 Nicotine dependence, unspecified, uncomplicated: Secondary | ICD-10-CM | POA: Diagnosis not present

## 2022-02-18 DIAGNOSIS — I5043 Acute on chronic combined systolic (congestive) and diastolic (congestive) heart failure: Secondary | ICD-10-CM | POA: Diagnosis present

## 2022-02-18 DIAGNOSIS — I130 Hypertensive heart and chronic kidney disease with heart failure and stage 1 through stage 4 chronic kidney disease, or unspecified chronic kidney disease: Secondary | ICD-10-CM | POA: Diagnosis not present

## 2022-02-18 DIAGNOSIS — Z743 Need for continuous supervision: Secondary | ICD-10-CM | POA: Diagnosis not present

## 2022-02-18 DIAGNOSIS — R000 Tachycardia, unspecified: Secondary | ICD-10-CM | POA: Diagnosis not present

## 2022-02-18 DIAGNOSIS — J385 Laryngeal spasm: Secondary | ICD-10-CM | POA: Diagnosis not present

## 2022-02-18 DIAGNOSIS — M5489 Other dorsalgia: Secondary | ICD-10-CM | POA: Diagnosis not present

## 2022-02-18 DIAGNOSIS — R791 Abnormal coagulation profile: Secondary | ICD-10-CM | POA: Diagnosis not present

## 2022-02-18 DIAGNOSIS — N1831 Chronic kidney disease, stage 3a: Secondary | ICD-10-CM | POA: Diagnosis present

## 2022-02-18 DIAGNOSIS — N179 Acute kidney failure, unspecified: Secondary | ICD-10-CM | POA: Diagnosis present

## 2022-02-18 DIAGNOSIS — F419 Anxiety disorder, unspecified: Secondary | ICD-10-CM | POA: Diagnosis present

## 2022-02-18 DIAGNOSIS — I2699 Other pulmonary embolism without acute cor pulmonale: Secondary | ICD-10-CM | POA: Diagnosis present

## 2022-02-18 DIAGNOSIS — Z86718 Personal history of other venous thrombosis and embolism: Secondary | ICD-10-CM

## 2022-02-18 DIAGNOSIS — Z86711 Personal history of pulmonary embolism: Secondary | ICD-10-CM

## 2022-02-18 DIAGNOSIS — I5082 Biventricular heart failure: Secondary | ICD-10-CM | POA: Diagnosis not present

## 2022-02-18 DIAGNOSIS — E785 Hyperlipidemia, unspecified: Secondary | ICD-10-CM | POA: Diagnosis not present

## 2022-02-18 DIAGNOSIS — Z952 Presence of prosthetic heart valve: Secondary | ICD-10-CM | POA: Diagnosis not present

## 2022-02-18 DIAGNOSIS — Z9221 Personal history of antineoplastic chemotherapy: Secondary | ICD-10-CM

## 2022-02-18 DIAGNOSIS — Z9012 Acquired absence of left breast and nipple: Secondary | ICD-10-CM

## 2022-02-18 DIAGNOSIS — Z8249 Family history of ischemic heart disease and other diseases of the circulatory system: Secondary | ICD-10-CM

## 2022-02-18 LAB — COMPREHENSIVE METABOLIC PANEL
ALT: 17 U/L (ref 0–44)
AST: 35 U/L (ref 15–41)
Albumin: 3.2 g/dL — ABNORMAL LOW (ref 3.5–5.0)
Alkaline Phosphatase: 139 U/L — ABNORMAL HIGH (ref 38–126)
Anion gap: 19 — ABNORMAL HIGH (ref 5–15)
BUN: 29 mg/dL — ABNORMAL HIGH (ref 8–23)
CO2: 21 mmol/L — ABNORMAL LOW (ref 22–32)
Calcium: 9.4 mg/dL (ref 8.9–10.3)
Chloride: 96 mmol/L — ABNORMAL LOW (ref 98–111)
Creatinine, Ser: 1.94 mg/dL — ABNORMAL HIGH (ref 0.44–1.00)
GFR, Estimated: 27 mL/min — ABNORMAL LOW (ref >60)
Glucose, Bld: 89 mg/dL (ref 70–99)
Potassium: 3.9 mmol/L (ref 3.5–5.1)
Sodium: 136 mmol/L (ref 135–145)
Total Bilirubin: 1.7 mg/dL — ABNORMAL HIGH (ref 0.3–1.2)
Total Protein: 7.2 g/dL (ref 6.5–8.1)

## 2022-02-18 LAB — CBC WITH DIFFERENTIAL/PLATELET
Abs Immature Granulocytes: 0.04 10*3/uL (ref 0.00–0.07)
Abs Immature Granulocytes: 0.03 10*3/uL (ref 0.00–0.07)
Basophils Absolute: 0.1 10*3/uL (ref 0.0–0.1)
Basophils Absolute: 0.1 10*3/uL (ref 0.0–0.1)
Basophils Relative: 2 %
Basophils Relative: 1 %
Eosinophils Absolute: 0.4 10*3/uL (ref 0.0–0.5)
Eosinophils Absolute: 0 10*3/uL (ref 0.0–0.5)
Eosinophils Relative: 4 %
Eosinophils Relative: 1 %
HCT: 37.9 % (ref 36.0–46.0)
HCT: 38.2 % (ref 36.0–46.0)
Hemoglobin: 12.3 g/dL (ref 12.0–15.0)
Hemoglobin: 12 g/dL (ref 12.0–15.0)
Immature Granulocytes: 0 %
Immature Granulocytes: 0 %
Lymphocytes Relative: 30 %
Lymphocytes Relative: 9 %
Lymphs Abs: 2.7 10*3/uL (ref 0.7–4.0)
Lymphs Abs: 0.6 10*3/uL — ABNORMAL LOW (ref 0.7–4.0)
MCH: 31 pg (ref 26.0–34.0)
MCH: 30.2 pg (ref 26.0–34.0)
MCHC: 32.5 g/dL (ref 30.0–36.0)
MCHC: 31.4 g/dL (ref 30.0–36.0)
MCV: 95.5 fL (ref 80.0–100.0)
MCV: 96 fL (ref 80.0–100.0)
Monocytes Absolute: 0.9 10*3/uL (ref 0.1–1.0)
Monocytes Absolute: 0.2 10*3/uL (ref 0.1–1.0)
Monocytes Relative: 10 %
Monocytes Relative: 2 %
Neutro Abs: 4.8 10*3/uL (ref 1.7–7.7)
Neutro Abs: 6.2 10*3/uL (ref 1.7–7.7)
Neutrophils Relative %: 54 %
Neutrophils Relative %: 87 %
Platelets: 314 10*3/uL (ref 150–400)
Platelets: 305 10*3/uL (ref 150–400)
RBC: 3.97 MIL/uL (ref 3.87–5.11)
RBC: 3.98 MIL/uL (ref 3.87–5.11)
RDW: 13.8 % (ref 11.5–15.5)
RDW: 13.7 % (ref 11.5–15.5)
WBC: 9 10*3/uL (ref 4.0–10.5)
WBC: 7.1 10*3/uL (ref 4.0–10.5)
nRBC: 0 % (ref 0.0–0.2)
nRBC: 0 % (ref 0.0–0.2)

## 2022-02-18 LAB — ECHOCARDIOGRAM COMPLETE
AR max vel: 2.18 cm2
AV Area VTI: 1.76 cm2
AV Area mean vel: 1.9 cm2
AV Mean grad: 2 mmHg
AV Peak grad: 3.5 mmHg
Ao pk vel: 0.93 m/s
MV VTI: 1.35 cm2
S' Lateral: 3.3 cm
Single Plane A4C EF: 41.8 %

## 2022-02-18 LAB — RESP PANEL BY RT-PCR (FLU A&B, COVID) ARPGX2
Influenza A by PCR: NEGATIVE
Influenza B by PCR: NEGATIVE
SARS Coronavirus 2 by RT PCR: NEGATIVE

## 2022-02-18 LAB — I-STAT VENOUS BLOOD GAS, ED
Acid-Base Excess: 0 mmol/L (ref 0.0–2.0)
Bicarbonate: 25.2 mmol/L (ref 20.0–28.0)
Calcium, Ion: 1.08 mmol/L — ABNORMAL LOW (ref 1.15–1.40)
HCT: 37 % (ref 36.0–46.0)
Hemoglobin: 12.6 g/dL (ref 12.0–15.0)
O2 Saturation: 87 %
Potassium: 3.2 mmol/L — ABNORMAL LOW (ref 3.5–5.1)
Sodium: 138 mmol/L (ref 135–145)
TCO2: 27 mmol/L (ref 22–32)
pCO2, Ven: 43.7 mmHg — ABNORMAL LOW (ref 44–60)
pH, Ven: 7.37 (ref 7.25–7.43)
pO2, Ven: 55 mmHg — ABNORMAL HIGH (ref 32–45)

## 2022-02-18 LAB — BASIC METABOLIC PANEL
Anion gap: 18 — ABNORMAL HIGH (ref 5–15)
BUN: 29 mg/dL — ABNORMAL HIGH (ref 8–23)
CO2: 21 mmol/L — ABNORMAL LOW (ref 22–32)
Calcium: 9.2 mg/dL (ref 8.9–10.3)
Chloride: 97 mmol/L — ABNORMAL LOW (ref 98–111)
Creatinine, Ser: 1.89 mg/dL — ABNORMAL HIGH (ref 0.44–1.00)
GFR, Estimated: 28 mL/min — ABNORMAL LOW (ref >60)
Glucose, Bld: 117 mg/dL — ABNORMAL HIGH (ref 70–99)
Potassium: 3.5 mmol/L (ref 3.5–5.1)
Sodium: 136 mmol/L (ref 135–145)

## 2022-02-18 LAB — MAGNESIUM: Magnesium: 2 mg/dL (ref 1.7–2.4)

## 2022-02-18 LAB — TROPONIN I (HIGH SENSITIVITY)
Troponin I (High Sensitivity): 18 ng/L — ABNORMAL HIGH (ref <18)
Troponin I (High Sensitivity): 18 ng/L — ABNORMAL HIGH (ref <18)

## 2022-02-18 LAB — BRAIN NATRIURETIC PEPTIDE: B Natriuretic Peptide: 367 pg/mL — ABNORMAL HIGH (ref 0.0–100.0)

## 2022-02-18 LAB — PROTIME-INR
INR: 2.2 — ABNORMAL HIGH (ref 0.8–1.2)
Prothrombin Time: 24.6 seconds — ABNORMAL HIGH (ref 11.4–15.2)

## 2022-02-18 MED ORDER — WARFARIN SODIUM 5 MG PO TABS
5.0000 mg | ORAL_TABLET | Freq: Every day | ORAL | Status: DC
Start: 2022-02-18 — End: 2022-02-18

## 2022-02-18 MED ORDER — FENTANYL CITRATE PF 50 MCG/ML IJ SOSY
50.0000 ug | PREFILLED_SYRINGE | Freq: Once | INTRAMUSCULAR | Status: AC
  Administered 2022-02-18: 50 ug via INTRAVENOUS
  Filled 2022-02-18: qty 1

## 2022-02-18 MED ORDER — ARFORMOTEROL TARTRATE 15 MCG/2ML IN NEBU
15.0000 ug | INHALATION_SOLUTION | Freq: Two times a day (BID) | RESPIRATORY_TRACT | Status: DC
  Administered 2022-02-18 – 2022-02-26 (×17): 15 ug via RESPIRATORY_TRACT
  Filled 2022-02-18 (×16): qty 2

## 2022-02-18 MED ORDER — NICOTINE 14 MG/24HR TD PT24
14.0000 mg | MEDICATED_PATCH | Freq: Every day | TRANSDERMAL | Status: DC
  Administered 2022-02-18 – 2022-02-22 (×4): 14 mg via TRANSDERMAL
  Filled 2022-02-18 (×6): qty 1

## 2022-02-18 MED ORDER — FUROSEMIDE 10 MG/ML IJ SOLN: 80.0000 mg | Freq: Every day | INTRAMUSCULAR | Status: DC

## 2022-02-18 MED ORDER — HYDRALAZINE HCL 25 MG PO TABS
25.0000 mg | ORAL_TABLET | Freq: Three times a day (TID) | ORAL | Status: DC
  Administered 2022-02-18 – 2022-02-25 (×23): 25 mg via ORAL
  Filled 2022-02-18 (×25): qty 1

## 2022-02-18 MED ORDER — FUROSEMIDE 10 MG/ML IJ SOLN
40.0000 mg | Freq: Once | INTRAMUSCULAR | Status: AC
  Administered 2022-02-18: 40 mg via INTRAVENOUS
  Filled 2022-02-18: qty 4

## 2022-02-18 MED ORDER — SALINE SPRAY 0.65 % NA SOLN
1.0000 | NASAL | Status: DC | PRN
  Administered 2022-02-23: 1 via NASAL
  Filled 2022-02-18: qty 44

## 2022-02-18 MED ORDER — FUROSEMIDE 10 MG/ML IJ SOLN
80.0000 mg | Freq: Every day | INTRAMUSCULAR | Status: DC
Start: 2022-02-18 — End: 2022-02-18

## 2022-02-18 MED ORDER — POLYETHYLENE GLYCOL 3350 17 G PO PACK: 17.0000 g | PACK | Freq: Every day | ORAL | Status: DC | PRN

## 2022-02-18 MED ORDER — WARFARIN - PHYSICIAN DOSING INPATIENT: Freq: Every day | Status: DC

## 2022-02-18 MED ORDER — FUROSEMIDE 10 MG/ML IJ SOLN
80.0000 mg | Freq: Two times a day (BID) | INTRAMUSCULAR | Status: DC
  Administered 2022-02-18 – 2022-02-21 (×7): 80 mg via INTRAVENOUS
  Filled 2022-02-18 (×8): qty 8

## 2022-02-18 MED ORDER — WARFARIN SODIUM 5 MG PO TABS
5.0000 mg | ORAL_TABLET | Freq: Once | ORAL | Status: AC
Start: 2022-02-18 — End: 2022-02-18
  Administered 2022-02-18: 5 mg via ORAL
  Filled 2022-02-18: qty 1

## 2022-02-18 MED ORDER — HYDROCODONE-ACETAMINOPHEN 5-325 MG PO TABS
1.0000 | ORAL_TABLET | ORAL | Status: DC | PRN
  Administered 2022-02-18 – 2022-02-19 (×6): 1 via ORAL
  Filled 2022-02-18 (×6): qty 1

## 2022-02-18 MED ORDER — WARFARIN - PHARMACIST DOSING INPATIENT: Freq: Every day | Status: DC

## 2022-02-18 MED ORDER — OXYCODONE HCL 5 MG PO TABS
5.0000 mg | ORAL_TABLET | Freq: Once | ORAL | Status: AC
  Administered 2022-02-18: 5 mg via ORAL
  Filled 2022-02-18: qty 1

## 2022-02-18 MED ORDER — METOPROLOL SUCCINATE ER 25 MG PO TB24
25.0000 mg | ORAL_TABLET | Freq: Every day | ORAL | Status: DC
  Administered 2022-02-18 – 2022-02-26 (×9): 25 mg via ORAL
  Filled 2022-02-18 (×9): qty 1

## 2022-02-18 MED ORDER — SIMVASTATIN 20 MG PO TABS
20.0000 mg | ORAL_TABLET | Freq: Every day | ORAL | Status: DC
  Administered 2022-02-18 – 2022-02-26 (×9): 20 mg via ORAL
  Filled 2022-02-18 (×9): qty 1

## 2022-02-18 MED ORDER — DM-GUAIFENESIN ER 30-600 MG PO TB12
1.0000 | ORAL_TABLET | Freq: Two times a day (BID) | ORAL | Status: DC
  Administered 2022-02-18 – 2022-02-19 (×4): 1 via ORAL
  Filled 2022-02-18 (×4): qty 1

## 2022-02-18 MED ORDER — ONDANSETRON HCL 4 MG/2ML IJ SOLN
4.0000 mg | Freq: Once | INTRAMUSCULAR | Status: AC
  Administered 2022-02-18: 4 mg via INTRAVENOUS
  Filled 2022-02-18: qty 2

## 2022-02-18 MED ORDER — UMECLIDINIUM BROMIDE 62.5 MCG/ACT IN AEPB
1.0000 | INHALATION_SPRAY | Freq: Every day | RESPIRATORY_TRACT | Status: DC
  Administered 2022-02-18 – 2022-02-26 (×9): 1 via RESPIRATORY_TRACT
  Filled 2022-02-18 (×3): qty 7

## 2022-02-18 MED ORDER — MONTELUKAST SODIUM 10 MG PO TABS
10.0000 mg | ORAL_TABLET | Freq: Every day | ORAL | Status: DC
  Administered 2022-02-18 – 2022-02-25 (×8): 10 mg via ORAL
  Filled 2022-02-18 (×8): qty 1

## 2022-02-18 MED ORDER — ISOSORBIDE MONONITRATE ER 30 MG PO TB24
30.0000 mg | ORAL_TABLET | Freq: Every day | ORAL | Status: DC
  Administered 2022-02-18 – 2022-02-26 (×8): 30 mg via ORAL
  Filled 2022-02-18 (×9): qty 1

## 2022-02-18 MED ORDER — ALBUTEROL SULFATE (2.5 MG/3ML) 0.083% IN NEBU
2.5000 mg | INHALATION_SOLUTION | Freq: Four times a day (QID) | RESPIRATORY_TRACT | Status: DC | PRN
Start: 2022-02-18 — End: 2022-02-26
  Administered 2022-02-23: 2.5 mg via RESPIRATORY_TRACT
  Filled 2022-02-18: qty 3

## 2022-02-18 MED ORDER — FUROSEMIDE 10 MG/ML IJ SOLN: 20.0000 mg | Freq: Once | INTRAMUSCULAR | Status: DC

## 2022-02-18 MED ORDER — FUROSEMIDE 10 MG/ML IJ SOLN
80.0000 mg | Freq: Once | INTRAMUSCULAR | Status: AC
  Administered 2022-02-18: 80 mg via INTRAVENOUS
  Filled 2022-02-18: qty 8

## 2022-02-18 NOTE — Hospital Course (Addendum)
8/30: Patient states that her breathing has improved mildly today. Patient is unsure of what triggered her CHF exacerbation. She states that she has not been drinking much water due to concern for fluid overload. She states that she did make some urine since her admission overnight. Discussed plan to diurese her.   8/31 continues to complain of back pain  9/5: Patient states that she is feeling well overall today. She states that her breathing and chest pain have resolved. Discussed plan for cardioversion tomorrow per cardiology. Discussed plan to speak with cardiology about her currently being on a blood thinner.  AKI - baseline less than 1.  May return to normal once back in nsr.  9/6: Patient is feeling well this morning. States that her breathing is feeling well today. Denies chest pain. Discussed plan for cardioversion today and possibility of being discharged afterwards.

## 2022-02-18 NOTE — ED Notes (Signed)
Patient taken off BIPAP, placed on Joshua 3L. Tolerating well, no signs of respiratory distress noted at this time.

## 2022-02-18 NOTE — ED Notes (Signed)
Pt c/o pain. RN explained to pt that she has PRN Norco Q4hrs. Last dose was at 0920 and RN is unable to give another dose until 1320. RN paged admitting team regarding more pain medication.

## 2022-02-18 NOTE — ED Notes (Addendum)
Awaiting ED provider assessment.  Patient able to speak in short sentences between breaths. Patient posturing is uncomfortable, working hard to catch a breath even on BIPAP. Patient not tripoding, but is grabbing onto side rails and leaning more towards her left side

## 2022-02-18 NOTE — ED Triage Notes (Addendum)
BIB EMS with CPAP on for worsening SOB throughout the night. Per patient started at 4pm yesterday. PMH CHF, COPD, and Asthma  Received 2xDuonebs, '125mg'$  solumedrol en route

## 2022-02-18 NOTE — ED Provider Notes (Addendum)
[facility] EMERGENCY DEPARTMENT Provider Note   CSN: [id number] Arrival date & time: 02/18/22  0201     History  Chief Complaint  Patient presents with   Respiratory Distress    Kim Anthony is a 71 y.o. female who presents via EMS in respiratory distress, requiring CPAP in route.  Patient with history of CHF, COPD, and PE in the past who presents with several days of cough and worsening shortness of breath that started around this afternoon.  States that she has been taking albuterol nebulizers at home without improvement.  No improvement with DuoNebs in the EMS.  Did receive Solu-Medrol and 2 DuoNeb's in route.  I personally reviewed her medical records.  Addition to the listed history she has history of hyperlipidemia, mitral valve replacement, breast cancer, thoracic ascending aortic aneurysm, CAD.  She is anticoagulated on warfarin takes Lasix 40 mg/day.  Level 5 caveat due to acuity of presentation on arrival.  HPI     Home Medications Prior to Admission medications   Medication Sig Start Date End Date Taking? Authorizing Provider  albuterol (PROVENTIL) (2.5 MG/3ML) 0.083% nebulizer solution USE 1 VIAL IN NEBULIZER EVERY 6 HOURS AS NEEDED FOR WHEEZING FOR SHORTNESS OF BREATH 11/04/21   Jose Persia, MD  albuterol (VENTOLIN HFA) 108 (90 Base) MCG/ACT inhaler Inhale 2 puffs into the lungs every 6 (six) hours as needed for wheezing or shortness of breath. 01/14/22   Masters, Joellen Jersey, DO  albuterol (VENTOLIN HFA) 108 (90 Base) MCG/ACT inhaler Inhale 2 puffs into the lungs every 6 (six) hours as needed for wheezing or shortness of breath. 01/14/22   Masters, Joellen Jersey, DO  diphenhydrAMINE (BENADRYL) 25 MG tablet Take 75 mg by mouth daily as needed for allergies. Patient not taking: Reported on 08/11/2021    [provider]  docusate sodium (COLACE) 100 MG capsule Take 1 capsule (100 mg total) by mouth 2 (two) times daily as needed for mild constipation. 08/06/21    Susa Day, MD  enoxaparin (LOVENOX) 80 MG/0.8ML injection Inject 0.8 mLs (80 mg total) into the skin every 12 (twelve) hours. Start 24 hours after your surgery. Patient not taking: Reported on 08/18/2021 08/11/21   Pennie Banter, RPH-CPP  famotidine (PEPCID) 20 MG tablet Take 1 tablet by mouth twice daily 12/05/21   Marianna Payment, MD  FARXIGA 10 MG TABS tablet Take 1 tablet by mouth once daily 02/09/22   Idamae Schuller, MD  fluticasone [facility]) 50 MCG/ACT nasal spray Place 1 spray into both nostrils daily as needed for allergies. 01/14/22 01/14/23  Masters, Katie, DO  furosemide (LASIX) 40 MG tablet Take 1 tablet (40 mg total) by mouth 2 (two) times daily for 5 days. Then return to your normal dose of 1 tablet daily 12/26/21 12/31/21  Dorie Rank, MD  gabapentin (NEURONTIN) 600 MG tablet TAKE 1 & 1/2 (ONE & ONE-HALF) TABLETS BY MOUTH THREE TIMES DAILY FOR  NERVE  PAIN 09/29/21   Lovorn, Jinny Blossom, MD  hydrALAZINE (APRESOLINE) 25 MG tablet Take 1 tablet (25 mg total) by mouth 3 (three) times daily. 02/13/21   Bensimhon, Shaune Pascal, MD  HYDROcodone-acetaminophen (NORCO) 5-325 MG tablet Take 1 tablet by mouth every 4 (four) hours as needed for moderate pain. 01/22/22   Lovorn, Jinny Blossom, MD  isosorbide mononitrate (IMDUR) 30 MG 24 hr tablet Take 1 tablet (30 mg total) by mouth daily. 04/23/21   Masters, Katie, DO  loratadine (CLARITIN) 10 MG tablet Take 1 tablet (10 mg total) by mouth daily.  11/28/21 02/26/22  Idamae Schuller, MD  metoprolol succinate (TOPROL-XL) 25 MG 24 hr tablet Take 1 tablet (25 mg total) by mouth daily. 06/06/21   Angelica Pou, MD  montelukast (SINGULAIR) 10 MG tablet Take 1 tablet (10 mg total) by mouth at bedtime. 04/23/21   Masters, Katie, DO  nicotine (NICODERM CQ - DOSED IN MG/24 HOURS) 21 mg/24hr patch Place 1 patch (21 mg total) onto the skin daily. 11/28/21 02/26/22  Idamae Schuller, MD  polyethylene glycol (MIRALAX / GLYCOLAX) 17 g packet Take 17 g by mouth daily. 08/06/21   Susa Day, MD   potassium chloride SA (KLOR-CON M) 20 MEQ tablet Take 2 tablets (40 mEq total) by mouth daily. 08/01/21   Bensimhon, Shaune Pascal, MD  simvastatin (ZOCOR) 20 MG tablet Take 1 tablet (20 mg total) by mouth daily. 04/23/21   Masters, Katie, DO  Spacer/Aero-Holding Chambers (BREATHERITE COLL SPACER ADULT) MISC 1 applicator by Does not apply route daily. 01/11/20   [place], Nila Nephew D, DO  spironolactone (ALDACTONE) 25 MG tablet Take 1 tablet by mouth once daily 11/19/21   Bensimhon, Shaune Pascal, MD  Tiotropium Bromide-Olodaterol (STIOLTO RESPIMAT) 2.5-2.5 MCG/ACT AERS INHALE 2 PUFFS BY MOUTH ONCE DAILY 01/14/22   Masters, Joellen Jersey, DO  warfarin (COUMADIN) 5 MG tablet TAKE 1& 1/2  TABLETS BY MOUTH ONCE DAILY AT 4 PM. 08/11/21   Pennie Banter, RPH-CPP      Allergies    Lisinopril, Acetaminophen-codeine, Propoxyphene, Tape, and Tramadol    Review of Systems   Review of Systems  Unable to perform ROS: Acuity of condition  Respiratory:  Positive for cough and shortness of breath.   Musculoskeletal:  Positive for myalgias.    Physical Exam Updated Vital Signs BP (!) 125/106   Pulse (!) 107   Temp 97.9 F (36.6 C) (Oral)   Resp 20   SpO2 100%  Physical Exam Vitals and nursing note reviewed.  Constitutional:      Appearance: She is ill-appearing. She is not toxic-appearing.  HENT:     Head: Normocephalic and atraumatic.     Mouth/Throat:     Mouth: Mucous membranes are moist.     Pharynx: No oropharyngeal exudate or posterior oropharyngeal erythema.  Eyes:     General:        Right eye: No discharge.        Left eye: No discharge.     Extraocular Movements: Extraocular movements intact.     Conjunctiva/sclera: Conjunctivae normal.     Pupils: Pupils are equal, round, and reactive to light.  Cardiovascular:     Rate and Rhythm: Regular rhythm. Tachycardia present.     Pulses: Normal pulses.     Heart sounds: Murmur heard.  Pulmonary:     Effort: Tachypnea, accessory muscle usage and  respiratory distress present.     Breath sounds: Examination of the right-lower field reveals rales. Examination of the left-lower field reveals rales. Rales present. No wheezing.     Comments: In BiPAP at time of my arrival to the room, endorses mildly improved SOB since starting BiPAP.  Abdominal:     General: Bowel sounds are normal. There is no distension.     Palpations: Abdomen is soft.     Tenderness: There is no abdominal tenderness. There is no guarding or rebound.  Musculoskeletal:        General: No deformity.     Cervical back: Neck supple.     Right lower leg: 1+ Edema present.  Left lower leg: 1+ Edema present.  Skin:    General: Skin is warm and dry.     Capillary Refill: Capillary refill takes less than 2 seconds.  Neurological:     General: No focal deficit present.     Mental Status: She is alert and oriented to person, place, and time. Mental status is at baseline.  Psychiatric:        Mood and Affect: Mood normal.     ED Results / Procedures / Treatments   Labs (all labs ordered are listed, but only abnormal results are displayed) Labs Reviewed  PROTIME-INR - Abnormal; Notable for the following components:      Result Value   Prothrombin Time 24.6 (*)    INR 2.2 (*)    All other components within normal limits  RESP PANEL BY RT-PCR (FLU A&B, COVID) ARPGX2  CBC WITH DIFFERENTIAL/PLATELET  COMPREHENSIVE METABOLIC PANEL  BRAIN NATRIURETIC PEPTIDE  TROPONIN I (HIGH SENSITIVITY)    EKG None  Radiology DG Chest Portable 1 View  Result Date: 02/18/2022 CLINICAL DATA:  Shortness of breath. EXAM: PORTABLE CHEST 1 VIEW COMPARISON:  02/04/2022. FINDINGS: Heart is enlarged and the mediastinal contour stable. The pulmonary vasculature is prominent. Atherosclerotic calcification of the aorta is noted. Sternotomy wires are present over the midline and a prosthetic cardiac valve is noted. Interstitial prominence is present bilaterally. No consolidation, effusion,  or pneumothorax. Surgical clips are present in the left axilla. IMPRESSION: 1. Cardiomegaly with prominent pulmonary vasculature. 2. Interstitial prominence bilaterally, possible edema or infiltrate. Electronically Signed   By: Brett Fairy M.D.   On: 02/18/2022 02:40    Procedures .Critical Care  Performed by: Emeline Darling, PA-C Authorized by: Emeline Darling, PA-C   Critical care provider statement:    Critical care time (minutes):  45   Critical care was time spent personally by me on the following activities:  Development of treatment plan with patient or surrogate, discussions with consultants, evaluation of patient's response to treatment, examination of patient, obtaining history from patient or surrogate, ordering and performing treatments and interventions, ordering and review of laboratory studies, ordering and review of radiographic studies, pulse oximetry and re-evaluation of patient's condition   Medications Ordered in ED Medications  fentaNYL (SUBLIMAZE) injection 50 mcg (has no administration in time range)  ondansetron (ZOFRAN) injection 4 mg (has no administration in time range)  furosemide (LASIX) injection 40 mg (40 mg Intravenous Given 02/18/22 0256)    ED Course/ Medical Decision Making/ A&P Clinical Course as of 02/18/22 0411  Wed Feb 18, 2022  0404 Consult to internal medicine resident who is agreeable to admitting this patient to his service for CHF exacerbation requiring BiPAP. I appreciate his collaboration in the care of this patient.  [RS]    Clinical Course User Index [RS] Amayah Staheli, Gypsy Balsam, PA-C                           Medical Decision Making 71 year old female who who presents in respiratory distress requiring BiPAP.  Hypertensive, tachycardic, tachypneic on intake with CPAP in place per EMS.  Transition to BiPAP in the ED.  Rales in the lung bases.  Tachycardia with regular rhythm, systolic murmur.  Abdomen soft nondistended  nontender.  1+ bilateral lower extremity edema with normal radial and pedal pulses bilaterally.  DDx includes but is limited to COPD exacerbation, HF exacerbation, dysrhythmia, ACS, PE.  Amount and/or Complexity of Data Reviewed Labs: ordered.  Details: CBC unremarkable, CMP with low CO2 21.  AKI with creatinine of 1.9 increased from patient's baseline of 0.9.  Elevated total bili to 1.7, anion gap of 19 of unclear etiology.  Troponin elevated to 18.  BNP mildly elevated to 367.  VBG 7.3 Radiology: ordered.    Details: Chest x-ray visualized this provider, agree with radiologist for interstitial edema versus infiltrate, based on clinical exam suspect edema.  ECG/medicine tests: independent interpretation performed.    Details:  EKG with sinus tachycardia without STEMI.  Risk Prescription drug management. Decision regarding hospitalization.   Patient will require admission to the hospital for respiratory distress secondary to CHF exacerbation requiring BiPAP support in the ED.  Patient with significant improvement in her symptomatology since administration of Lasix and BiPAP in the ED.  Kim Anthony voiced understanding of her medical evaluation and treatment plan.  Each of her questions was answered to her expressed satisfaction.  She is amenable to plan for admission at this time.  Consult to internal medicine service as above, patient admitted to their team.  This chart was dictated using voice recognition software, Dragon. Despite the best efforts of this provider to proofread and correct errors, errors may still occur which can change documentation meaning.   Final Clinical Impression(s) / ED Diagnoses Final diagnoses:  None    Rx / DC Orders ED Discharge Orders     None         Emeline Darling, PA-C 02/18/22 0416    Huxley Vanwagoner, Gypsy Balsam, PA-C 02/18/22 0724    Maudie Flakes, MD 02/18/22 [phone]

## 2022-02-18 NOTE — ED Notes (Signed)
Assisted pt to sit up and eat lunch. Pt denies any needs at this time.

## 2022-02-18 NOTE — Progress Notes (Signed)
   02/18/22 1556  Assess: MEWS Score  BP 123/69  MAP (mmHg) 86  Pulse Rate (!) 108  ECG Heart Rate (!) 108  Resp (!) 22  Level of Consciousness Alert  SpO2 96 %  Assess: MEWS Score  MEWS Temp 0  MEWS Systolic 0  MEWS Pulse 1  MEWS RR 1  MEWS LOC 0  MEWS Score 2  MEWS Score Color Yellow  Treat  MEWS Interventions Administered scheduled meds/treatments  Take Vital Signs  Increase Vital Sign Frequency  Yellow: Q 2hr X 2 then Q 4hr X 2, if remains yellow, continue Q 4hrs  Escalate  MEWS: Escalate Yellow: discuss with charge nurse/RN and consider discussing with provider and RRT  Notify: Charge Nurse/RN  Name of Charge Nurse/RN Notified Dianna  Date Charge Nurse/RN Notified 02/18/22  Time Charge Nurse/RN Notified 1558  Assess: SIRS CRITERIA  SIRS Temperature  0  SIRS Pulse 1  SIRS Respirations  1  SIRS WBC 1  SIRS Score Sum  3   Patient yellow MEWs in ED, appears stable will complete every 2 hours x 2 vitals and continue to monitor

## 2022-02-18 NOTE — Progress Notes (Addendum)
HD#0 SUBJECTIVE:  Patient Summary: Kim Anthony is a 71 y.o. person living with a history of CHF post MV replacement, COPD, PEs, and hypertension who presented with shortness of breath and admitted for acute decompensated heart failure exacerbation .  Overnight Events:  NAEO    Interm History:  Patient seen and evaluated at bedside. Reports slight improvement in breathing. Swelling in legs has improved greatly, previously swollen and red. She does not recall any recent lifestyle changes that would have precipitated exacerbation. Denies recent changes in diet, no increased water consumption, no medication non-adherence.   OBJECTIVE:  Vital Signs: Blood pressure (!) 164/132, pulse (!) 105, temperature 98 F (36.7 C), temperature source Oral, resp. rate 19, SpO2 96 %.  Vitals:   02/18/22 0645 02/18/22 0700 02/18/22 0715 02/18/22 0730  BP:      Pulse: 100 (!) 106 (!) 105 (!) 105  Resp: (!) '22 17 19 19  '$ Temp:      TempSrc:      SpO2: 97% 96% 98% 96%   Supplemental O2:  SpO2: 96 % O2 Flow Rate (L/min): 3 L/min  There were no vitals filed for this visit.   Intake/Output Summary (Last 24 hours) at 02/18/2022 0846 Last data filed at 02/18/2022 6010 Gross per 24 hour  Intake --  Output 600 ml  Net -600 ml   Net IO Since Admission: -600 mL [02/18/22 0846]  Physical Exam:  General:   obese woman, awake and alert, lying in hospital bed, cooperative and pleasant, not in acute distress Skin:   warm and dry, lower extremities with bilateral erythema and chronic skin changes, left chest mastectomy scar. Lungs:   increased respiratory effort but able to speak in full sentences, symmetrical chest rise, bibasilar crackles, no wheeze Cardiac:   regular rate and rhythm, normal S1 and S2, unable to accurately visualize JVD but none appreciated, no significant pitting edema in legs Abdomen:   soft and non-distended, no tenderness to palpation or guarding Neurologic:   alert and oriented  to person-place-time, no focal deficits noted Psychiatric:   mood and affect normal, intelligible speech  Patient Lines/Drains/Airways Status     Active Line/Drains/Airways     Name Placement date Placement time Site Days   Peripheral IV 02/18/22 20 G Anterior;Right Forearm 02/18/22  0212  Forearm  less than 1   External Urinary Catheter 02/18/22  0255  --  less than 1   Incision (Closed) 08/06/21 Knee Left 08/06/21  1245  -- 196   Wound / Incision (Open or Dehisced) 01/06/21  Breast Left Masectomy Site 01/06/21  0800  Breast  408            Pertinent Labs:    Latest Ref Rng & Units 02/18/2022    5:19 AM 02/18/2022    3:55 AM 02/18/2022    2:17 AM  CBC  WBC 4.0 - 10.5 K/uL 7.1   9.0   Hemoglobin 12.0 - 15.0 g/dL 12.0  12.6  12.3   Hematocrit 36.0 - 46.0 % 38.2  37.0  37.9   Platelets 150 - 400 K/uL 305   314        Latest Ref Rng & Units 02/18/2022    5:19 AM 02/18/2022    3:55 AM 02/18/2022    2:17 AM  CMP  Glucose 70 - 99 mg/dL 117   89   BUN 8 - 23 mg/dL 29   29   Creatinine 0.44 - 1.00 mg/dL 1.89  1.94   Sodium 135 - 145 mmol/L 136  138  136   Potassium 3.5 - 5.1 mmol/L 3.5  3.2  3.9   Chloride 98 - 111 mmol/L 97   96   CO2 22 - 32 mmol/L 21   21   Calcium 8.9 - 10.3 mg/dL 9.2   9.4   Total Protein 6.5 - 8.1 g/dL   7.2   Total Bilirubin 0.3 - 1.2 mg/dL   1.7   Alkaline Phos 38 - 126 U/L   139   AST 15 - 41 U/L   35   ALT 0 - 44 U/L   17     No results for input(s): "GLUCAP" in the last 72 hours.   Pertinent Imaging: US RENAL  Result Date: 02/18/2022 CLINICAL DATA:  Acute kidney injury. EXAM: RENAL / URINARY TRACT ULTRASOUND COMPLETE COMPARISON:  None Available. FINDINGS: Right Kidney: Renal measurements: 12.3 x 5.2 x 5.4 cm = volume: 182 mL. Echogenicity within normal limits. No mass or hydronephrosis visualized. Left Kidney: Renal measurements: 10.6 x 5.7 x 4.8 cm = volume: 151.6 mL. Echogenicity within normal limits. No mass or hydronephrosis visualized.  Bladder: Appears normal for degree of bladder distention. Other: Ascites noted within the right upper quadrant of the abdomen. IMPRESSION: 1. Normal appearance of the kidneys. No mass or hydronephrosis identified. 2. Right upper quadrant ascites. Electronically Signed   By: Kerby Moors M.D.   On: 02/18/2022 06:17   DG Chest Portable 1 View  Result Date: 02/18/2022 CLINICAL DATA:  Shortness of breath. EXAM: PORTABLE CHEST 1 VIEW COMPARISON:  02/04/2022. FINDINGS: Heart is enlarged and the mediastinal contour stable. The pulmonary vasculature is prominent. Atherosclerotic calcification of the aorta is noted. Sternotomy wires are present over the midline and a prosthetic cardiac valve is noted. Interstitial prominence is present bilaterally. No consolidation, effusion, or pneumothorax. Surgical clips are present in the left axilla. IMPRESSION: 1. Cardiomegaly with prominent pulmonary vasculature. 2. Interstitial prominence bilaterally, possible edema or infiltrate. Electronically Signed   By: Brett Fairy M.D.   On: 02/18/2022 02:40    ASSESSMENT/PLAN:  Assessment: Principal Problem:   Combined systolic and diastolic congestive heart failure (HCC) Active Problems:   Pulmonary embolism (HCC)   Chronic low back pain   Tobacco use disorder   COPD (chronic obstructive pulmonary disease) (HCC)   S/P MVR (mitral valve replacement)   Osteoarthritis of left knee   Kim Anthony is a 71 y.o. person living with a history of CHF post MV replacement, COPD, PEs, and hypertension who presented with shortness of breath and admitted for acute decompensated heart failure exacerbation on hospital day 0     # Acute on chronic HFpEF exacerbation with EF 50-55% Echocardiogram performed in 2/23 revealed EF 50-55% without evidence of MVR. Unclear what precipitated exacerbation at this time as she does not report any lifestyle changes or medication non-adherence. Baseline weight appears to be around 79kg.   Initially slow to respond to lasix, now making some urine. Transitioned off BiPAP to 3L [place]. Symptomatically improving.   - IV lasix '80mg'$  twice daily - Monitor Ins-Outs and collect daily weights - Continue home Toprol XL, hydralazine, and Imdur - Holding home Farxiga and spironolactone given AKI - Echocardiogram to reevaluate EF - Maintain K > 4.0, Mg > 2.0 - PT/OT evaluation   #AKI on CKD IIIA Baseline Cr appears to be around 1, GFR 50s suggestive of likely CKD IIIA.  Patient grossly volume overloaded on initial exam. No  hydronephrosis on renal US. AKI likely related to venous congestion in setting of CHF exacerbation. Anticipate improvement with diuresis. Little UOP after '120mg'$  IV lasix.  - Bladder scan - monitor with daily BMP - Avoid nephrotoxic medications - Strict I+Os    #COPD On Stiolto respimat, albuterol, and montelukast at home. Per chart review, no hospitalizations for COPD exacerbation in the past year. Does not appear to be following with a pulmonologist in the outpatient setting.  No wheezing on exam today, does not appear to be in an exacerbation currently. - Continue home albuterol and montelukast - Brovana nebulizer and incruse ellipta in place of home stiolto respimat - may benefit from pulm referral as an outpatient.    #HTN #HLD On toprol-xl '25mg'$  daily, hydralazine '25mg'$  TID, imdur '30mg'$  daily, spironolactone '25mg'$  daily, farxiga '10mg'$  daily, and lasix '40mg'$  daily at home.  - Continue toprol-xl, hydralazine, and imdur at home doses - Will hold Farxiga and spironolactone given AKI    #Hx of PE (06/2015) #Hx of MV replacement On chronic anticoagulation with coumadin. Goal INR 2.5-3.5 given mechanical MV. Follows Dr. Elie Confer. Takes coumadin '5mg'$  daily at home. INR on admission 2.2, subtherapeutic - Coumadin per pharmacy to adjust dose as needed -  Trend PT/INR    #Chronic low back pain Longstanding history of radiculopathy with sciatic nerve pain per patient. On  chronic therapy with norco 5-'325mg'$  q4h prn at home. - Continue home norco    #Tobacco use disorder Currently smokes about 6 cigarettes per day and has been doing so for the past 30 years. - nicotine patch  Diet: Heart Healthy, Carb-modified VTE: Coumadin IVF: None,None Code: Full Prior to Admission Living Arrangement: Home, living with daughter Anticipated Discharge Location: Home Barriers to Discharge: CHF exacerbation Dispo: Admit patient to Observation with expected length of stay less than 2 midnights.   Delene Ruffini, MD  Please contact the on call pager after 5 pm and on weekends at [phone].

## 2022-02-18 NOTE — H&P (Addendum)
Date: 02/18/2022               Patient Name:  SHERRITA RIEDERER [name]: [id number]  DOB: [birth year] Age / Sex: 71 y.o., female   PCP: Iona Coach, MD         Medical Service: Internal Medicine Teaching Service         Attending Physician: Dr. Sid Falcon, MD    First Contact: Delene Ruffini, MD      Pager: Lysbeth Penner [phone]      Second Contact: Iona Beard, MD      Pager: Governor Rooks [phone]           After Hours (After 5p/  First Contact Pager: [phone]  weekends / holidays): Second Contact Pager: [phone]   SUBJECTIVE   Chief Complaint: Shortness of Breath  History of Present Illness: Ms. Aracena is a 71 year old female with a past medical history of CHF post MV replacement, CAD, COPD, PEs, and hypertension who presented to the ED for shortness of breath. She reports increased shortness of breath with exertion that started about 2-3 days ago. She tried to self-manage her symptoms at home with her inhaler medications, but they were largely ineffective. Throughout the last 2-3 days, her shortness of breath gradually worsened and it became increasingly difficult for her to breathe lying flat so she used multiple pillows to prop herself up. She was unable to ambulate around her home due to the shortness of breath. Additionally, she noted more swelling than usual in her legs and some mild chest tightness associated with shortness of breath. She also reports feeling weak and more tired. Her chronic cough, sometimes productive of yellow-tinted sputum, has not worsened over the last few days. She has been compliant with all of her home medications, including furosemide. Denies fever, chills, sweating, lightheadedness, dizziness, chest pain, and changes to urinary or bowel patterns.  She currently smokes about six cigarettes per day and has been smoking for about 30 years. She drinks about two beers per week and denies any recreational drug use. At home, she lives with her daughter and is fully  independent with all ADLs. She usually ambulates without assistance, but will sometimes use a cane due to knee pain.    ED Course: Ipratropium bromide-albuterol and salmeterol administered en route, neither of which improved symptoms. Upon arrival to the ED, intravenous furosemide '40mg'$  was administered. The patient was also put on BiPAP.   Meds:  Current Meds  Medication Sig   albuterol (PROVENTIL) (2.5 MG/3ML) 0.083% nebulizer solution USE 1 VIAL IN NEBULIZER EVERY 6 HOURS AS NEEDED FOR WHEEZING FOR SHORTNESS OF BREATH (Patient taking differently: Take 2.5 mg by nebulization every 6 (six) hours as needed for shortness of breath.)   albuterol (VENTOLIN HFA) 108 (90 Base) MCG/ACT inhaler Inhale 2 puffs into the lungs every 6 (six) hours as needed for wheezing or shortness of breath.   diphenhydrAMINE (BENADRYL) 25 MG tablet Take 75 mg by mouth daily as needed for allergies.   docusate sodium (COLACE) 100 MG capsule Take 1 capsule (100 mg total) by mouth 2 (two) times daily as needed for mild constipation. (Patient taking differently: Take 100 mg by mouth daily as needed for mild constipation.)   FARXIGA 10 MG TABS tablet Take 1 tablet by mouth once daily   fluticasone (FLONASE) 50 MCG/ACT nasal spray Place 1 spray into both nostrils daily as needed for allergies.   furosemide (LASIX) 40 MG tablet Take 1 tablet (40 mg total) by mouth 2 (  two) times daily for 5 days. Then return to your normal dose of 1 tablet daily (Patient taking differently: Take 40 mg by mouth daily.)   gabapentin (NEURONTIN) 600 MG tablet TAKE 1 & 1/2 (ONE & ONE-HALF) TABLETS BY MOUTH THREE TIMES DAILY FOR  NERVE  PAIN (Patient taking differently: Take 900 mg by mouth 3 (three) times daily. Take 1 and 1/2 tablets by mouth three times a day for nerve pain)   hydrALAZINE (APRESOLINE) 25 MG tablet Take 1 tablet (25 mg total) by mouth 3 (three) times daily.   HYDROcodone-acetaminophen (NORCO) 5-325 MG tablet Take 1 tablet by mouth  every 4 (four) hours as needed for moderate pain.   isosorbide mononitrate (IMDUR) 30 MG 24 hr tablet Take 1 tablet (30 mg total) by mouth daily.   loratadine (CLARITIN) 10 MG tablet Take 1 tablet (10 mg total) by mouth daily.   metoprolol succinate (TOPROL-XL) 25 MG 24 hr tablet Take 1 tablet (25 mg total) by mouth daily.   montelukast (SINGULAIR) 10 MG tablet Take 1 tablet (10 mg total) by mouth at bedtime.   polyethylene glycol (MIRALAX / GLYCOLAX) 17 g packet Take 17 g by mouth daily. (Patient taking differently: Take 17 g by mouth daily as needed for mild constipation.)   Simethicone (GAS-X PO) Take 1 tablet by mouth daily.   simvastatin (ZOCOR) 20 MG tablet Take 1 tablet (20 mg total) by mouth daily.   spironolactone (ALDACTONE) 25 MG tablet Take 1 tablet by mouth once daily (Patient taking differently: Take 25 mg by mouth daily.)   Tiotropium Bromide-Olodaterol (STIOLTO RESPIMAT) 2.5-2.5 MCG/ACT AERS INHALE 2 PUFFS BY MOUTH ONCE DAILY (Patient taking differently: Inhale 2 puffs by mouth daily)   warfarin (COUMADIN) 5 MG tablet TAKE 1& 1/2  TABLETS BY MOUTH ONCE DAILY AT 4 PM. (Patient taking differently: Take 5 mg by mouth at bedtime.)    Past Medical History  Past Surgical History:  Procedure Laterality Date   APPLICATION OF A-CELL OF CHEST/ABDOMEN Left 01/27/2019   Procedure: APPLICATION OF A-CELL OF CHEST;  Surgeon: Wallace Going, DO;  Location: [place];  Service: Plastics;  Laterality: Left;   APPLICATION OF WOUND VAC N/A 01/27/2019   Procedure: APPLICATION OF WOUND VAC;  Surgeon: Wallace Going, DO;  Location: [place];  Service: Plastics;  Laterality: N/A;   BREAST SURGERY Left 2001   for CA   COLONOSCOPY WITH PROPOFOL N/A 06/07/2018   Procedure: COLONOSCOPY WITH PROPOFOL;  Surgeon: Wilford Corner, MD;  Location: [facility];  Service: Endoscopy;  Laterality: N/A;   DEBRIDEMENT AND CLOSURE WOUND Left 12/28/2018   Procedure: Debridement And Closure Wound;  Surgeon:  Coralie Keens, MD;  Location: [place];  Service: General;  Laterality: Left;   INCISION AND DRAINAGE OF WOUND Left 01/27/2019   Procedure: IRRIGATION AND DEBRIDEMENT OF CHEST WALL;  Surgeon: Wallace Going, DO;  Location: [place];  Service: Plastics;  Laterality: Left;   KNEE ARTHROSCOPY WITH MEDIAL MENISECTOMY Left 08/06/2021   Procedure: KNEE ARTHROSCOPY WITH PARTIAL MEDIAL MENISECTOMY, DEBRIDEMENT;  Surgeon: Susa Day, MD;  Location: [facility];  Service: Orthopedics;  Laterality: Left;  1 HR   LATISSIMUS FLAP TO BREAST Left 01/18/2019   Procedure: LEFT LATISSIMUS FLAP TO BREAST;  Surgeon: Wallace Going, DO;  Location: [place];  Service: Plastics;  Laterality: Left;   MITRAL VALVE REPLACEMENT     mechanical   POLYPECTOMY  06/07/2018   Procedure: POLYPECTOMY;  Surgeon: Wilford Corner, MD;  Location: [facility];  Service: Endoscopy;;   RIGHT/LEFT HEART CATH AND CORONARY ANGIOGRAPHY N/A 01/09/2021   Procedure: RIGHT/LEFT HEART CATH AND CORONARY ANGIOGRAPHY;  Surgeon: Jolaine Artist, MD;  Location: [facility];  Service: Cardiovascular;  Laterality: N/A;   WOUND DEBRIDEMENT Left 12/28/2018   Procedure: EXCISION OF LEFT CHRONIC CHEST WALL WOUND;  Surgeon: Coralie Keens, MD;  Location: [place] [facility];  Service: General;  Laterality: Left;    Social:  Lives With: daughter Support: family Level of Function: independent in all ADLs PCP: Dr Stann Mainland Substances: Tobacco, EtOH   Family History: Significant for breast cancer on maternal side, lung cancer on paternal side, hypertension in both sides   Allergies: Allergies as of 02/18/2022 - Review Complete 02/18/2022  Allergen Reaction Noted   Lisinopril Swelling 01/07/2021   Acetaminophen-codeine Itching 07/10/2015   Propoxyphene Itching 01/18/2019   Tape Rash 03/09/2019   Tramadol Itching, Nausea And Vomiting, and Rash 07/10/2015    Review of Systems: A complete ROS was negative except as per HPI.   OBJECTIVE:    Physical Exam: Blood pressure (!) 133/118, pulse (!) 107, temperature 97.9 F (36.6 C), temperature source Oral, resp. rate 15, SpO2 100 %.   General:   obese woman, awake and alert, lying in hospital bed, cooperative and pleasant, speaking through BiPAP machine, not in acute distress Skin:   warm and dry, intact without any obvious lesions or scars, no rashes or lesions  Lungs:   increased respiratory effort, breathing labored, symmetrical chest rise, bibasilar crackles Cardiac:   regular rate and rhythm, normal S1 and S2, capillary refill 2-3 seconds, 1+ pitting edema L>R, unable to accurately visualize JVD but none appreciated Abdomen:   soft and non-distended, no tenderness to palpation or guarding Neurologic:   alert and oriented to person-place-time, no focal deficits noted Psychiatric:   mood and affect normal, intelligible speech   Labs: CBC    Component Value Date/Time   WBC 7.1 02/18/2022 0519   RBC 3.98 02/18/2022 0519   HGB 12.0 02/18/2022 0519   HGB 12.8 01/13/2022 1038   HCT 38.2 02/18/2022 0519   HCT 38.8 01/13/2022 1038   PLT 305 02/18/2022 0519   PLT 427 01/13/2022 1038   MCV 96.0 02/18/2022 0519   MCV 96 01/13/2022 1038   MCH 30.2 02/18/2022 0519   MCHC 31.4 02/18/2022 0519   RDW 13.7 02/18/2022 0519   RDW 11.4 (L) 01/13/2022 1038   LYMPHSABS 0.6 (L) 02/18/2022 0519   LYMPHSABS 3.0 04/21/2018 1624   MONOABS 0.2 02/18/2022 0519   EOSABS 0.0 02/18/2022 0519   EOSABS 0.2 04/21/2018 1624   BASOSABS 0.1 02/18/2022 0519   BASOSABS 0.1 04/21/2018 1624     CMP     Component Value Date/Time   NA 138 02/18/2022 0355   NA 136 01/13/2022 1038   K 3.2 (L) 02/18/2022 0355   CL 96 (L) 02/18/2022 0217   CO2 21 (L) 02/18/2022 0217   GLUCOSE 89 02/18/2022 0217   BUN 29 (H) 02/18/2022 0217   BUN 15 01/13/2022 1038   CREATININE 1.94 (H) 02/18/2022 0217   CALCIUM 9.4 02/18/2022 0217   PROT 7.2 02/18/2022 0217   PROT 7.9 01/27/2021 1015   ALBUMIN 3.2 (L)  02/18/2022 0217   ALBUMIN 4.6 01/27/2021 1015   AST 35 02/18/2022 0217   ALT 17 02/18/2022 0217   ALKPHOS 139 (H) 02/18/2022 0217   BILITOT 1.7 (H) 02/18/2022 0217   BILITOT 0.5 01/27/2021 1015   GFRNONAA 27 (L) 02/18/2022 0217  GFRAA 104 02/28/2020 1159    Imaging: DG Chest Portable 1 View  Result Date: 02/18/2022 CLINICAL DATA:  Shortness of breath. EXAM: PORTABLE CHEST 1 VIEW COMPARISON:  02/04/2022. FINDINGS: Heart is enlarged and the mediastinal contour stable. The pulmonary vasculature is prominent. Atherosclerotic calcification of the aorta is noted. Sternotomy wires are present over the midline and a prosthetic cardiac valve is noted. Interstitial prominence is present bilaterally. No consolidation, effusion, or pneumothorax. Surgical clips are present in the left axilla. IMPRESSION: 1. Cardiomegaly with prominent pulmonary vasculature. 2. Interstitial prominence bilaterally, possible edema or infiltrate. Electronically Signed   By: Brett Fairy M.D.   On: 02/18/2022 02:40      EKG: personally reviewed my interpretation is sinus tachycardia, no acute ischemia. Prior EKG from 8-17 also with sinus tachycardia.   ASSESSMENT & PLAN:   Assessment & Plan by Problem: Principal Problem:   Combined systolic and diastolic congestive heart failure (HCC) Active Problems:   Pulmonary embolism (HCC)   Chronic low back pain   Tobacco use disorder   COPD (chronic obstructive pulmonary disease) (HCC)   S/P MVR (mitral valve replacement)   Osteoarthritis of left knee   MARETA CHESNUT is a 71 y.o. person living with a history of CHF post MV replacement, COPD, PEs, and hypertension who presented with shortness of breath and admitted for acute decompensated heart failure exacerbation on hospital day 0   #CHF Echocardiogram performed in 07-2021 revealed EF 50-55% without evidence of MVR. Patient now presenting with 2-3 days of worsening shortness of breath. She also reports orthopnea, lower  extremity swelling, and chest tightness. Nebulized medications have been ineffective at reducing her symptoms. On exam, pitting edema was present in BLE and lung auscultation revealed bibasilar crackles. Labs collected in the ED significant for BNP 367. Borderline-elevated troponin, likely due to demand ischemia. The patient takes furosemide '40mg'$ , Toprol XL '25mg'$ , hydralazine '25mg'$ , and spironolactone '25mg'$  at home. She was given intravenous furosemide '40mg'$  upon arrival to the ED and has not yet produced urine. > Administer intravenous furosemide '80mg'$  > Monitor Ins-Outs and collect daily weights > Continue home Toprol XL, hydralazine, and Imdur > Holding home Farxiga and spironolactone given AKI > Echocardiogram to reevaluate EF > Maintain K > 4.0, Mg > 2.0 > Trend troponins through peak   #AKI on CKD IIIA Baseline Cr appears to be around 1 - 1.1, GFR 40s-50s suggestive of likely CKD IIIA. On admission, Cr of 1.94 with GFR 27. Venous blood gas showing pCO2 of 43.7 without acidosis, so symptoms likely not COPD exacerbation. Volume overloaded on exam, likely component of cardiorenal syndrome. Anticipate improvement with diuresis. >IV lasix '80mg'$  once >F/u renal U/S >Repeat BMP >Avoid nephrotoxic medications >Monitor UOP   #COPD On Stiolto respimat, albuterol, and montelukast at home. Per chart review, no hospitalizations for COPD exacerbation in the past year. Does not appear to be following a pulmonologist in the outpatient setting. No wheezing on exam today, does not appear to be in an exacerbation currently. >Continue home albuterol and montelukast >Brovana nebulizer and incruse ellipta in place of home stiolto respimat   #HTN #HLD On toprol-xl '25mg'$  daily, hydralazine '25mg'$  TID, imdur '30mg'$  daily, spironolactone '25mg'$  daily, farxiga '10mg'$  daily, and lasix '40mg'$  daily at home. BP normotensive here, took home BP medications yesterday. >Continue toprol-xl, hydralazine, and imdur at home doses >Will  hold Farxiga and spironolactone given AKI >Continue at-home simvastatin '20mg'$  daily   #Hx of PE (06/2015) #Hx of MV replacement On chronic anticoagulation with  coumadin. Goal INR 2.5-3.5 given mechanical MV. Follows Dr. Elie Confer. Takes coumadin '5mg'$  daily at home. >INR on admission 2.2, subtherapeutic >Coumadin per pharmacy to adjust dose as needed > Trend PT/INR   #Chronic low back pain Longstanding history of radiculopathy with sciatic nerve pain per patient. On chronic therapy with norco 5-'325mg'$  q4h prn at home. >Continue home norco   #Tobacco use disorder Currently smokes about 6 cigarettes per day and has been doing so for the past 30 years. > Order nicotine patch and counsel on importance of smoking cessation, especially given comorbid COPD    Diet: Heart Healthy, Carb-modified VTE: Coumadin IVF: None,None Code: Full  Prior to Admission Living Arrangement: Home, living with daughter Anticipated Discharge Location: Home Barriers to Discharge: CHF exacerbation  Dispo: Admit patient to Observation with expected length of stay less than 2 midnights.  Signed: Serita Butcher, MD Internal Medicine Resident PGY-1  02/18/2022, 5:38 AM

## 2022-02-18 NOTE — Progress Notes (Signed)
Paged by RN regarding O2 sats into 80's.   Patient seen and evaluated at bedside. She is resting on her side. Endorsed some shortness of breath.  Reported dry cough which provokes her back pain. Also reported feeling wheezy. No other complaints. Nasal cannula was in nose, but tubing attached to cannister below stretcher. O2 did not appear to be running from cannister. O2 noted to be 89-91%. Attached tubing to wall and turned O2 flow to 2L with improvement in sats to 97%. Subjective improvement in breathing after O2 started.  Physical Exam: General: Patient resting in stretcher on side. No acute distress. Cardiac: regular rate and rhythm no MRG Pulm: bibasilar crackles, diffuse mild end expiratory wheeze Neuro: awake, mentation appropriate, no focal deficits Skin: warm and dry  A/P: Hypoxia likely related to lack of supplemental O2 flow in setting of CHF exacerbation. Improved with supplemental O2. Will also provide breathing treatment for wheeze.

## 2022-02-18 NOTE — ED Notes (Signed)
RN introduced self to pt. Pt stating "I feel a lot better than I did when I came in last night." Pt previously on 3L [place] at 100% O2. RN weaned pt down to 2L O2, pt oxygen at 96. Pt linen changed and cleaned up.

## 2022-02-18 NOTE — ED Notes (Signed)
ED Provider at bedside. 

## 2022-02-18 NOTE — Progress Notes (Signed)
ANTICOAGULATION CONSULT NOTE - Initial Consult  Pharmacy Consult for Coumadin Indication: mechanical mitral valve   Allergies  Allergen Reactions   Lisinopril Swelling   Acetaminophen-Codeine Itching   Propoxyphene Itching    Darvocet   Tape Rash    Plastic   Tramadol Itching, Nausea And Vomiting and Rash    Patient Measurements:     Vital Signs: Temp: 97.9 F (36.6 C) (08/30 0215) Temp Source: Oral (08/30 0215) BP: 140/91 (08/30 0500) Pulse Rate: 107 (08/30 0500)  Labs: Recent Labs    02/18/22 0217 02/18/22 0355  HGB 12.3 12.6  HCT 37.9 37.0  PLT 314  --   LABPROT 24.6*  --   INR 2.2*  --   CREATININE 1.94*  --   TROPONINIHS 18*  --     CrCl cannot be calculated (Unknown ideal weight.).   Medical History: Past Medical History:  Diagnosis Date   Arthritis    Asthma    Breast cancer ([place]) 2001   Left Breast Cancer   CHF (congestive heart failure) (HCC)    COPD (chronic obstructive pulmonary disease) (HCC)    Coronary artery disease    GERD (gastroesophageal reflux disease)    History of mitral valve replacement with mechanical valve    HLD (hyperlipidemia)    Hypertension    Pulmonary embolism ([place]) 2021    Medications:  No current facility-administered medications on file prior to encounter.   Current Outpatient Medications on File Prior to Encounter  Medication Sig Dispense Refill   albuterol (PROVENTIL) (2.5 MG/3ML) 0.083% nebulizer solution USE 1 VIAL IN NEBULIZER EVERY 6 HOURS AS NEEDED FOR WHEEZING FOR SHORTNESS OF BREATH (Patient taking differently: Take 2.5 mg by nebulization every 6 (six) hours as needed for shortness of breath.) 90 mL 2   albuterol (VENTOLIN HFA) 108 (90 Base) MCG/ACT inhaler Inhale 2 puffs into the lungs every 6 (six) hours as needed for wheezing or shortness of breath. 8 g 2   diphenhydrAMINE (BENADRYL) 25 MG tablet Take 75 mg by mouth daily as needed for allergies.     docusate sodium (COLACE) 100 MG capsule Take 1  capsule (100 mg total) by mouth 2 (two) times daily as needed for mild constipation. (Patient taking differently: Take 100 mg by mouth daily as needed for mild constipation.) 30 capsule 1   FARXIGA 10 MG TABS tablet Take 1 tablet by mouth once daily 90 tablet 0   fluticasone (FLONASE) 50 MCG/ACT nasal spray Place 1 spray into both nostrils daily as needed for allergies. 9.9 mL 2   furosemide (LASIX) 40 MG tablet Take 1 tablet (40 mg total) by mouth 2 (two) times daily for 5 days. Then return to your normal dose of 1 tablet daily (Patient taking differently: Take 40 mg by mouth daily.) 45 tablet 3   gabapentin (NEURONTIN) 600 MG tablet TAKE 1 & 1/2 (ONE & ONE-HALF) TABLETS BY MOUTH THREE TIMES DAILY FOR  NERVE  PAIN (Patient taking differently: Take 900 mg by mouth 3 (three) times daily. Take 1 and 1/2 tablets by mouth three times a day for nerve pain) 150 tablet 5   hydrALAZINE (APRESOLINE) 25 MG tablet Take 1 tablet (25 mg total) by mouth 3 (three) times daily. 90 tablet 11   HYDROcodone-acetaminophen (NORCO) 5-325 MG tablet Take 1 tablet by mouth every 4 (four) hours as needed for moderate pain. 150 tablet 0   isosorbide mononitrate (IMDUR) 30 MG 24 hr tablet Take 1 tablet (30 mg total) by mouth daily.  90 tablet 2   loratadine (CLARITIN) 10 MG tablet Take 1 tablet (10 mg total) by mouth daily. 90 tablet 0   metoprolol succinate (TOPROL-XL) 25 MG 24 hr tablet Take 1 tablet (25 mg total) by mouth daily. 90 tablet 2   montelukast (SINGULAIR) 10 MG tablet Take 1 tablet (10 mg total) by mouth at bedtime. 90 tablet 2   polyethylene glycol (MIRALAX / GLYCOLAX) 17 g packet Take 17 g by mouth daily. (Patient taking differently: Take 17 g by mouth daily as needed for mild constipation.) 14 each 0   Simethicone (GAS-X PO) Take 1 tablet by mouth daily.     simvastatin (ZOCOR) 20 MG tablet Take 1 tablet (20 mg total) by mouth daily. 90 tablet 2   spironolactone (ALDACTONE) 25 MG tablet Take 1 tablet by mouth once  daily (Patient taking differently: Take 25 mg by mouth daily.) 90 tablet 3   Tiotropium Bromide-Olodaterol (STIOLTO RESPIMAT) 2.5-2.5 MCG/ACT AERS INHALE 2 PUFFS BY MOUTH ONCE DAILY (Patient taking differently: Inhale 2 puffs by mouth daily) 4 g 0   warfarin (COUMADIN) 5 MG tablet TAKE 1& 1/2  TABLETS BY MOUTH ONCE DAILY AT 4 PM. (Patient taking differently: Take 5 mg by mouth at bedtime.) 42 tablet 1   famotidine (PEPCID) 20 MG tablet Take 1 tablet by mouth twice daily (Patient not taking: Reported on 02/18/2022) 60 tablet 0   nicotine (NICODERM CQ - DOSED IN MG/24 HOURS) 21 mg/24hr patch Place 1 patch (21 mg total) onto the skin daily. (Patient not taking: Reported on 02/18/2022) 30 patch 2   potassium chloride SA (KLOR-CON M) 20 MEQ tablet Take 2 tablets (40 mEq total) by mouth daily. (Patient taking differently: Take 20 mEq by mouth daily.) 30 tablet 6   Spacer/Aero-Holding Chambers (BREATHERITE COLL SPACER ADULT) MISC 1 applicator by Does not apply route daily. 1 each 0   [DISCONTINUED] albuterol (VENTOLIN HFA) 108 (90 Base) MCG/ACT inhaler Inhale 2 puffs into the lungs every 6 (six) hours as needed for wheezing or shortness of breath. 8 g 2   [DISCONTINUED] enoxaparin (LOVENOX) 80 MG/0.8ML injection Inject 0.8 mLs (80 mg total) into the skin every 12 (twelve) hours. Start 24 hours after your surgery. (Patient not taking: Reported on 08/18/2021) 8 mL 1     Assessment: 71 y.o. female with h/o mechanical MVR and PE for Coumadin.  INR slightly subtherapeutic--pt has had recent changes to Coumadin regimen  Goal of Therapy:  INR 2.5-3.5 Monitor platelets by anticoagulation protocol: Yes   Plan:  Continue Coumadin 5 mg daily as ordered Daily INR  Aylen Rambert, Bronson Curb 02/18/2022,5:18 AM

## 2022-02-19 ENCOUNTER — Telehealth: Payer: Self-pay | Admitting: *Deleted

## 2022-02-19 DIAGNOSIS — I5043 Acute on chronic combined systolic (congestive) and diastolic (congestive) heart failure: Secondary | ICD-10-CM

## 2022-02-19 DIAGNOSIS — M545 Low back pain, unspecified: Secondary | ICD-10-CM | POA: Diagnosis present

## 2022-02-19 DIAGNOSIS — I4892 Unspecified atrial flutter: Secondary | ICD-10-CM | POA: Diagnosis not present

## 2022-02-19 DIAGNOSIS — R410 Disorientation, unspecified: Secondary | ICD-10-CM | POA: Diagnosis not present

## 2022-02-19 DIAGNOSIS — E669 Obesity, unspecified: Secondary | ICD-10-CM | POA: Diagnosis not present

## 2022-02-19 DIAGNOSIS — R188 Other ascites: Secondary | ICD-10-CM | POA: Diagnosis not present

## 2022-02-19 DIAGNOSIS — I5040 Unspecified combined systolic (congestive) and diastolic (congestive) heart failure: Secondary | ICD-10-CM | POA: Diagnosis present

## 2022-02-19 DIAGNOSIS — Z952 Presence of prosthetic heart valve: Secondary | ICD-10-CM | POA: Diagnosis not present

## 2022-02-19 DIAGNOSIS — N1831 Chronic kidney disease, stage 3a: Secondary | ICD-10-CM | POA: Diagnosis not present

## 2022-02-19 DIAGNOSIS — I132 Hypertensive heart and chronic kidney disease with heart failure and with stage 5 chronic kidney disease, or end stage renal disease: Secondary | ICD-10-CM | POA: Diagnosis not present

## 2022-02-19 DIAGNOSIS — F17200 Nicotine dependence, unspecified, uncomplicated: Secondary | ICD-10-CM | POA: Diagnosis not present

## 2022-02-19 DIAGNOSIS — R0600 Dyspnea, unspecified: Secondary | ICD-10-CM | POA: Diagnosis not present

## 2022-02-19 DIAGNOSIS — I472 Ventricular tachycardia, unspecified: Secondary | ICD-10-CM | POA: Diagnosis not present

## 2022-02-19 DIAGNOSIS — Z20822 Contact with and (suspected) exposure to covid-19: Secondary | ICD-10-CM | POA: Diagnosis not present

## 2022-02-19 DIAGNOSIS — I7121 Aneurysm of the ascending aorta, without rupture: Secondary | ICD-10-CM | POA: Diagnosis not present

## 2022-02-19 DIAGNOSIS — F419 Anxiety disorder, unspecified: Secondary | ICD-10-CM | POA: Diagnosis present

## 2022-02-19 DIAGNOSIS — J385 Laryngeal spasm: Secondary | ICD-10-CM | POA: Diagnosis not present

## 2022-02-19 DIAGNOSIS — N179 Acute kidney failure, unspecified: Secondary | ICD-10-CM | POA: Diagnosis not present

## 2022-02-19 DIAGNOSIS — I2510 Atherosclerotic heart disease of native coronary artery without angina pectoris: Secondary | ICD-10-CM | POA: Diagnosis not present

## 2022-02-19 DIAGNOSIS — I509 Heart failure, unspecified: Secondary | ICD-10-CM | POA: Diagnosis not present

## 2022-02-19 DIAGNOSIS — I5020 Unspecified systolic (congestive) heart failure: Secondary | ICD-10-CM | POA: Diagnosis present

## 2022-02-19 DIAGNOSIS — I5082 Biventricular heart failure: Secondary | ICD-10-CM | POA: Diagnosis not present

## 2022-02-19 DIAGNOSIS — J810 Acute pulmonary edema: Secondary | ICD-10-CM | POA: Diagnosis not present

## 2022-02-19 DIAGNOSIS — F17210 Nicotine dependence, cigarettes, uncomplicated: Secondary | ICD-10-CM | POA: Diagnosis not present

## 2022-02-19 DIAGNOSIS — I252 Old myocardial infarction: Secondary | ICD-10-CM | POA: Diagnosis not present

## 2022-02-19 DIAGNOSIS — I110 Hypertensive heart disease with heart failure: Secondary | ICD-10-CM | POA: Diagnosis not present

## 2022-02-19 DIAGNOSIS — I272 Pulmonary hypertension, unspecified: Secondary | ICD-10-CM | POA: Diagnosis not present

## 2022-02-19 DIAGNOSIS — J449 Chronic obstructive pulmonary disease, unspecified: Secondary | ICD-10-CM | POA: Diagnosis not present

## 2022-02-19 DIAGNOSIS — E785 Hyperlipidemia, unspecified: Secondary | ICD-10-CM | POA: Diagnosis not present

## 2022-02-19 DIAGNOSIS — I484 Atypical atrial flutter: Secondary | ICD-10-CM | POA: Diagnosis not present

## 2022-02-19 DIAGNOSIS — G8929 Other chronic pain: Secondary | ICD-10-CM | POA: Diagnosis not present

## 2022-02-19 DIAGNOSIS — I130 Hypertensive heart and chronic kidney disease with heart failure and stage 1 through stage 4 chronic kidney disease, or unspecified chronic kidney disease: Secondary | ICD-10-CM | POA: Diagnosis not present

## 2022-02-19 DIAGNOSIS — I5042 Chronic combined systolic (congestive) and diastolic (congestive) heart failure: Secondary | ICD-10-CM | POA: Diagnosis not present

## 2022-02-19 DIAGNOSIS — R791 Abnormal coagulation profile: Secondary | ICD-10-CM | POA: Diagnosis present

## 2022-02-19 DIAGNOSIS — I2582 Chronic total occlusion of coronary artery: Secondary | ICD-10-CM | POA: Diagnosis present

## 2022-02-19 DIAGNOSIS — M5489 Other dorsalgia: Secondary | ICD-10-CM | POA: Diagnosis not present

## 2022-02-19 DIAGNOSIS — N1832 Chronic kidney disease, stage 3b: Secondary | ICD-10-CM | POA: Diagnosis not present

## 2022-02-19 LAB — PROTIME-INR
INR: 3.2 — ABNORMAL HIGH (ref 0.8–1.2)
Prothrombin Time: 32.8 seconds — ABNORMAL HIGH (ref 11.4–15.2)

## 2022-02-19 LAB — MAGNESIUM: Magnesium: 2.3 mg/dL (ref 1.7–2.4)

## 2022-02-19 LAB — BASIC METABOLIC PANEL
Anion gap: 10 (ref 5–15)
BUN: 40 mg/dL — ABNORMAL HIGH (ref 8–23)
CO2: 24 mmol/L (ref 22–32)
Calcium: 8.4 mg/dL — ABNORMAL LOW (ref 8.9–10.3)
Chloride: 101 mmol/L (ref 98–111)
Creatinine, Ser: 1.89 mg/dL — ABNORMAL HIGH (ref 0.44–1.00)
GFR, Estimated: 28 mL/min — ABNORMAL LOW (ref >60)
Glucose, Bld: 111 mg/dL — ABNORMAL HIGH (ref 70–99)
Potassium: 3.2 mmol/L — ABNORMAL LOW (ref 3.5–5.1)
Sodium: 135 mmol/L (ref 135–145)

## 2022-02-19 MED ORDER — HYDROCODONE-ACETAMINOPHEN 5-325 MG PO TABS
2.0000 | ORAL_TABLET | ORAL | Status: DC | PRN
  Administered 2022-02-19 – 2022-02-26 (×29): 2 via ORAL
  Filled 2022-02-19 (×29): qty 2

## 2022-02-19 MED ORDER — POTASSIUM CHLORIDE 10 MEQ/100ML IV SOLN
10.0000 meq | INTRAVENOUS | Status: DC
  Administered 2022-02-19: 10 meq via INTRAVENOUS
  Filled 2022-02-19: qty 100

## 2022-02-19 MED ORDER — POTASSIUM CHLORIDE 20 MEQ PO PACK
40.0000 meq | PACK | Freq: Once | ORAL | Status: AC
Start: 2022-02-19 — End: 2022-02-19
  Administered 2022-02-19: 40 meq via ORAL
  Filled 2022-02-19: qty 2

## 2022-02-19 MED ORDER — RAMELTEON 8 MG PO TABS
8.0000 mg | ORAL_TABLET | Freq: Every day | ORAL | Status: DC
  Administered 2022-02-19 – 2022-02-25 (×8): 8 mg via ORAL
  Filled 2022-02-19 (×9): qty 1

## 2022-02-19 MED ORDER — WARFARIN SODIUM 5 MG PO TABS: 5.0000 mg | ORAL_TABLET | Freq: Once | ORAL | Status: DC

## 2022-02-19 MED ORDER — POTASSIUM CHLORIDE 10 MEQ/100ML IV SOLN: 10.0000 meq | INTRAVENOUS | Status: DC

## 2022-02-19 MED ORDER — LIDOCAINE HCL 1 % IJ SOLN
INTRAMUSCULAR | Status: AC
  Filled 2022-02-19: qty 20

## 2022-02-19 MED ORDER — WARFARIN SODIUM 2.5 MG PO TABS
2.5000 mg | ORAL_TABLET | Freq: Once | ORAL | Status: AC
  Administered 2022-02-19: 2.5 mg via ORAL
  Filled 2022-02-19: qty 1

## 2022-02-19 NOTE — Progress Notes (Signed)
Heart Failure Navigator Progress Note  Assessed for Heart & Vascular TOC clinic readiness.  Prior to hospitalization, pt had established care with AHF clinic. Last seen by Dr. Haroldine Laws 07/2021.   HF Navigation will sign-off.   Pricilla Holm, MSN, RN

## 2022-02-19 NOTE — Telephone Encounter (Signed)
Kim Anthony called for a refill on her hydrocodone.  It looks like she is currently admitted to hospital. Per PMP last refill was 01/22/22.

## 2022-02-19 NOTE — Evaluation (Signed)
Occupational Therapy Evaluation Patient Details Name: Kim Anthony [name]: [id number] DOB: 07/27/1950 Today's Date: 02/19/2022   History of Present Illness Pt is 71 yo female who presented for SOB. PMH includes: chronic HFpEF (last EF 50-55%) with mitral valve replacement on chronic coumadin, COPD, CAD, history of pulmonary embolism.   Clinical Impression   Pt admitted with the above diagnoses and presents with below problem list. Pt will benefit from continued acute OT to address the below listed deficits and maximize independence with basic ADLs prior to d/c home. At baseline, pt is independent with ADLs, drives. She does endorse occasional use of cane 2/2 R knee pain. Pt currently needs up to min guard assist with functional transfers (tub shower, toilet) and LB ADLs. Received on 3L O2 with SpO2 >93, removed [place] for OOB ADL task with SpO2 slowly decreasing to 89 towards end of OOB activity on RA. Reapplied [place] at 2L O2 with SpO2 95-97. DOE 1/4.       Recommendations for follow up therapy are one component of a multi-disciplinary discharge planning process, led by the attending physician.  Recommendations may be updated based on patient status, additional functional criteria and insurance authorization.   Follow Up Recommendations  No OT follow up    Assistance Recommended at Discharge PRN  Patient can return home with the following Help with stairs or ramp for entrance    Functional Status Assessment  Patient has had a recent decline in their functional status and demonstrates the ability to make significant improvements in function in a reasonable and predictable amount of time.  Equipment Recommendations  None recommended by OT    Recommendations for Other Services PT consult     Precautions / Restrictions Restrictions Weight Bearing Restrictions: No      Mobility Bed Mobility Overal bed mobility: Needs Assistance Bed Mobility: Supine to Sit, Sit to Supine     Supine to sit:  Supervision Sit to supine: Supervision        Transfers Overall transfer level: Needs assistance Equipment used: None Transfers: Sit to/from Stand Sit to Stand: Supervision           General transfer comment: to/from EOB      Balance Overall balance assessment: No apparent balance deficits (not formally assessed)                                         ADL either performed or assessed with clinical judgement   ADL Overall ADL's : Needs assistance/impaired Eating/Feeding: Set up;Sitting   Grooming: Min guard;Standing;Oral care;Wash/dry hands;Wash/dry face   Upper Body Bathing: Set up;Sitting   Lower Body Bathing: Min guard;Sit to/from stand   Upper Body Dressing : Set up;Sitting   Lower Body Dressing: Min guard;Sit to/from stand   Toilet Transfer: Min guard;Ambulation   Toileting- Clothing Manipulation and Hygiene: Min guard;Sit to/from stand   Tub/ Shower Transfer: Tub transfer;Min guard;Ambulation Tub/Shower Transfer Details (indicate cue type and reason): per clinical judgement Functional mobility during ADLs: Supervision/safety;Min guard General ADL Comments: Pt completed a few dynamic standing balance challenges (stepped on and off standing scale, stood to complete 3 grooming tasks at sink) mild unsteadiness noted. Decreased activity tolerance and generalized weakness impacting ADLs.     Vision         Perception     Praxis      Pertinent Vitals/Pain Pain Assessment Pain Assessment: Faces Pain  Score: 8  Pain Location: R forearm site of IV (K+ infusion) at end of session Pain Descriptors / Indicators: Burning, Grimacing, Moaning Pain Intervention(s): Monitored during session, Repositioned, Limited activity within patient's tolerance, Ice applied, Other (comment) (notified nurse)     Hand Dominance Right   Extremity/Trunk Assessment Upper Extremity Assessment Upper Extremity Assessment: Generalized weakness   Lower Extremity  Assessment Lower Extremity Assessment: Defer to PT evaluation       Communication Communication Communication: No difficulties   Cognition Arousal/Alertness: Awake/alert Behavior During Therapy: WFL for tasks assessed/performed Overall Cognitive Status: Within Functional Limits for tasks assessed                                       General Comments     Exercises     Shoulder Instructions      Home Living Family/patient expects to be discharged to:: Private residence Living Arrangements: Children Available Help at Discharge: Family;Other (Comment) (lives with daughter who works) Type of Home: House Home Access: Stairs to enter Technical brewer of Steps: 6 Entrance Stairs-Rails: [place]: One level     Bathroom Shower/Tub: Tub/shower unit         [place]: Kasandra Knudsen - single point;Shower seat          Prior Functioning/Environment Prior Level of Function : Independent/Modified Independent             Mobility Comments: intermittent use of cane d/t R knee pain ADLs Comments: occasional cane usage otherwise fully indpependent. drives.        OT Problem List: Decreased strength;Decreased activity tolerance;Impaired balance (sitting and/or standing);Decreased knowledge of use of DME or AE;Decreased knowledge of precautions;Cardiopulmonary status limiting activity;Pain      OT Treatment/Interventions: Self-care/ADL training;Energy conservation;DME and/or AE instruction;Therapeutic activities;Patient/family education;Balance training    OT Goals(Current goals can be found in the care plan section) Acute Rehab OT Goals Patient Stated Goal: feel better OT Goal Formulation: With patient Time For Goal Achievement: 03/05/22 Potential to Achieve Goals: Good ADL Goals Pt Will Perform Grooming: with modified independence;standing Pt Will Perform Lower Body Dressing: with modified independence;sit to/from stand Pt Will Transfer  to Toilet: Independently;ambulating Pt Will Perform Toileting - Clothing Manipulation and hygiene: with modified independence;sit to/from stand  OT Frequency: Min 2X/week    Co-evaluation              AM-PAC OT "6 Clicks" Daily Activity     Outcome Measure Help from another person eating meals?: None Help from another person taking care of personal grooming?: None Help from another person toileting, which includes using toliet, bedpan, or urinal?: A Little Help from another person bathing (including washing, rinsing, drying)?: A Little Help from another person to put on and taking off regular upper body clothing?: None Help from another person to put on and taking off regular lower body clothing?: None 6 Click Score: 22   End of Session Equipment Utilized During Treatment: Oxygen (vs RA) Nurse Communication: Other (comment) (SpO2 readings during session, left on 2L, pt 8/10 FACEs pain at end of session appears to be at IV site (K+))  Activity Tolerance: Patient tolerated treatment well;Patient limited by pain Patient left: in bed;with call bell/phone within reach;with bed alarm set  OT Visit Diagnosis: Unsteadiness on feet (R26.81);Muscle weakness (generalized) (M62.81);Pain Pain - Right/Left: Right Pain - part of body: Arm  Time: [id number] OT Time Calculation (min): 34 min Charges:  OT General Charges $OT Visit: 1 Visit OT Evaluation $OT Eval Low Complexity: 1 Low OT Treatments $Self Care/Home Management : 8-22 mins  Tyrone Schimke, OT Acute Rehabilitation Services Office: [phone]   Hortencia Pilar 02/19/2022, 10:21 AM

## 2022-02-19 NOTE — Consult Note (Addendum)
Cardiology Consultation   Patient ID: Kim Anthony [name]: [id number]; DOB: [birth year]  Admit date: 02/18/2022 Date of Consult: 02/19/2022  PCP:  Kim Coach, Anthony   [facility] Providers Cardiologist:  Dr. Tommy Anthony here to update Anthony or APP on Care Team, Refresh:1}     Patient Profile:   Kim Anthony is a 71 y.o. female with a hx of mechanical MVR in 2001; CAD s/p CABG; history of breast cancer s/p L mastectomy, radiation, chemo; COPD with tobacco use; prior DVT/PE who is being seen 02/19/2022 for the evaluation of heart failure at the request of Kim Anthony.  History of Present Illness:   Kim Anthony was last seen by Dr. Haroldine Anthony on 08/01/21. At that time, she was seen for preoperative evaluation for knee surgery.  She presented 02/18/22 with shortness of breath and edema for 2-3 days prior to presentation. Denies medication nonadherence, dietary indiscretion, or other changes that could explain her symptoms. She is still smoking about 6 cigarettes/Anthony.  She required BiPAP on arrival to the ER and was given lasix. She is currently on 3L O2 by nasal cannula.  She is feeling much better today compared to when she came in. She has a mildly productive cough with yellow sputum. May have had fevers/chills a few days ago, but she was already having swelling and shortness of breath at that time. Swelling is now completely resolved.  Past Medical History:  Diagnosis Date   Arthritis    Asthma    Breast cancer ([place]) 2001   Left Breast Cancer   CHF (congestive heart failure) (HCC)    COPD (chronic obstructive pulmonary disease) (HCC)    Coronary artery disease    GERD (gastroesophageal reflux disease)    History of mitral valve replacement with mechanical valve    HLD (hyperlipidemia)    Hypertension    Pulmonary embolism ([place]) 2021    Past Surgical History:  Procedure Laterality Date   APPLICATION OF A-CELL OF CHEST/ABDOMEN Left 01/27/2019   Procedure: APPLICATION OF  A-CELL OF CHEST;  Surgeon: Kim Going, Anthony;  Location: [facility];  Service: Plastics;  Laterality: Left;   APPLICATION OF WOUND VAC N/A 01/27/2019   Procedure: APPLICATION OF WOUND VAC;  Surgeon: Kim Going, Anthony;  Location: [facility];  Service: Plastics;  Laterality: N/A;   BREAST SURGERY Left 2001   for CA   COLONOSCOPY WITH PROPOFOL N/A 06/07/2018   Procedure: COLONOSCOPY WITH PROPOFOL;  Surgeon: Kim Corner, Anthony;  Location: [facility];  Service: Endoscopy;  Laterality: N/A;   DEBRIDEMENT AND CLOSURE WOUND Left 12/28/2018   Procedure: Debridement And Closure Wound;  Surgeon: Kim Keens, Anthony;  Location: [facility];  Service: General;  Laterality: Left;   INCISION AND DRAINAGE OF WOUND Left 01/27/2019   Procedure: IRRIGATION AND DEBRIDEMENT OF CHEST WALL;  Surgeon: Kim Going, Anthony;  Location: [facility];  Service: Plastics;  Laterality: Left;   KNEE ARTHROSCOPY WITH MEDIAL MENISECTOMY Left 08/06/2021   Procedure: KNEE ARTHROSCOPY WITH PARTIAL MEDIAL MENISECTOMY, DEBRIDEMENT;  Surgeon: Kim Anthony;  Location: [facility];  Service: Orthopedics;  Laterality: Left;  1 HR   LATISSIMUS FLAP TO BREAST Left 01/18/2019   Procedure: LEFT LATISSIMUS FLAP TO BREAST;  Surgeon: Kim Going, Anthony;  Location: [facility];  Service: Plastics;  Laterality: Left;   MITRAL VALVE REPLACEMENT     mechanical   POLYPECTOMY  06/07/2018   Procedure: POLYPECTOMY;  Surgeon: Kim Corner, Anthony;  [facility]: [facility];  Service: Endoscopy;;   RIGHT/LEFT HEART CATH AND CORONARY ANGIOGRAPHY N/A 01/09/2021   Procedure: RIGHT/LEFT HEART CATH AND CORONARY ANGIOGRAPHY;  Surgeon: Kim Artist, Anthony;  Location: [facility];  Service: Cardiovascular;  Laterality: N/A;   WOUND DEBRIDEMENT Left 12/28/2018   Procedure: EXCISION OF LEFT CHRONIC CHEST WALL WOUND;  Surgeon: Kim Keens, Anthony;  Location: [facility];  Service: General;  Laterality: Left;     Home Medications:  Prior to Admission  medications   Medication Sig Start Date End Date Taking? Authorizing Provider  albuterol (PROVENTIL) (2.5 MG/3ML) 0.083% nebulizer solution USE 1 VIAL IN NEBULIZER EVERY 6 HOURS AS NEEDED FOR WHEEZING FOR SHORTNESS OF BREATH Patient taking differently: Take 2.5 mg by nebulization every 6 (six) hours as needed for shortness of breath. 11/04/21  Yes Kim Anthony  albuterol (VENTOLIN HFA) 108 (90 Base) MCG/ACT inhaler Inhale 2 puffs into the lungs every 6 (six) hours as needed for wheezing or shortness of breath. 01/14/22  Yes Masters, Kim Anthony  diphenhydrAMINE (BENADRYL) 25 MG tablet Take 75 mg by mouth daily as needed for allergies.   Yes Provider, Historical, Anthony  docusate sodium (COLACE) 100 MG capsule Take 1 capsule (100 mg total) by mouth 2 (two) times daily as needed for mild constipation. Patient taking differently: Take 100 mg by mouth daily as needed for mild constipation. 08/06/21  Yes Kim Anthony  FARXIGA 10 MG TABS tablet Take 1 tablet by mouth once daily 02/09/22  Yes Kim Anthony  fluticasone [facility]) 50 MCG/ACT nasal spray Place 1 spray into both nostrils daily as needed for allergies. 01/14/22 01/14/23 Yes Masters, Kim Anthony  furosemide (LASIX) 40 MG tablet Take 1 tablet (40 mg total) by mouth 2 (two) times daily for 5 days. Then return to your normal dose of 1 tablet daily Patient taking differently: Take 40 mg by mouth daily. 12/26/21 02/19/23 Yes Kim Anthony  gabapentin (NEURONTIN) 600 MG tablet TAKE 1 & 1/2 (ONE & ONE-HALF) TABLETS BY MOUTH THREE TIMES DAILY FOR  NERVE  PAIN Patient taking differently: Take 900 mg by mouth 3 (three) times daily. Take 1 and 1/2 tablets by mouth three times a Anthony for nerve pain 09/29/21  Yes Kim Anthony  hydrALAZINE (APRESOLINE) 25 MG tablet Take 1 tablet (25 mg total) by mouth 3 (three) times daily. 02/13/21  Yes Kim Anthony  HYDROcodone-acetaminophen (NORCO) 5-325 MG tablet Take 1 tablet by mouth every 4 (four) hours as  needed for moderate pain. 01/22/22  Yes Kim Anthony  isosorbide mononitrate (IMDUR) 30 MG 24 hr tablet Take 1 tablet (30 mg total) by mouth daily. 04/23/21  Yes Masters, Kim Anthony  loratadine (CLARITIN) 10 MG tablet Take 1 tablet (10 mg total) by mouth daily. 11/28/21 02/26/22 Yes Kim Anthony  metoprolol succinate (TOPROL-XL) 25 MG 24 hr tablet Take 1 tablet (25 mg total) by mouth daily. 06/06/21  Yes Angelica Pou, Anthony  montelukast (SINGULAIR) 10 MG tablet Take 1 tablet (10 mg total) by mouth at bedtime. 04/23/21  Yes Masters, Kim Anthony  polyethylene glycol (MIRALAX / GLYCOLAX) 17 g packet Take 17 g by mouth daily. Patient taking differently: Take 17 g by mouth daily as needed for mild constipation. 08/06/21  Yes Kim Anthony  Simethicone (GAS-X PO) Take 1 tablet by mouth daily.   Yes Provider, Historical, Anthony  simvastatin (ZOCOR) 20 MG tablet Take 1 tablet (20 mg total) by mouth daily. 04/23/21  Yes Masters, Joellen Jersey,  Anthony  spironolactone (ALDACTONE) 25 MG tablet Take 1 tablet by mouth once daily Patient taking differently: Take 25 mg by mouth daily. 11/19/21  Yes Kim Anthony  Tiotropium Bromide-Olodaterol (STIOLTO RESPIMAT) 2.5-2.5 MCG/ACT AERS INHALE 2 PUFFS BY MOUTH ONCE DAILY Patient taking differently: Inhale 2 puffs by mouth daily 01/14/22  Yes Masters, Kim Anthony  warfarin (COUMADIN) 5 MG tablet TAKE 1& 1/2  TABLETS BY MOUTH ONCE DAILY AT 4 PM. Patient taking differently: Take 5 mg by mouth at bedtime. 08/11/21  Yes Pennie Banter, RPH-CPP  famotidine (PEPCID) 20 MG tablet Take 1 tablet by mouth twice daily Patient not taking: Reported on 02/18/2022 12/05/21   Marianna Payment, Anthony  nicotine (NICODERM CQ - DOSED IN MG/24 HOURS) 21 mg/24hr patch Place 1 patch (21 mg total) onto the skin daily. Patient not taking: Reported on 02/18/2022 11/28/21 02/26/22  Kim Anthony  potassium chloride SA (KLOR-CON M) 20 MEQ tablet Take 2 tablets (40 mEq total) by mouth daily. Patient taking  differently: Take 20 mEq by mouth daily. 08/01/21   Kim Anthony  Spacer/Aero-Holding Chambers (BREATHERITE COLL SPACER ADULT) MISC 1 applicator by Does not apply route daily. 01/11/20   Modena Nunnery D, Anthony    Inpatient Medications: Scheduled Meds:  arformoterol  15 mcg Nebulization BID   And   umeclidinium bromide  1 puff Inhalation Daily   dextromethorphan-guaiFENesin  1 tablet Oral BID   furosemide  80 mg Intravenous BID   hydrALAZINE  25 mg Oral TID   isosorbide mononitrate  30 mg Oral Daily   lidocaine       metoprolol succinate  25 mg Oral Daily   montelukast  10 mg Oral QHS   nicotine  14 mg Transdermal Daily   ramelteon  8 mg Oral QHS   simvastatin  20 mg Oral Daily   Warfarin - Pharmacist Dosing Inpatient   Does not apply q1600   Continuous Infusions:  PRN Meds: albuterol, HYDROcodone-acetaminophen, lidocaine, polyethylene glycol, sodium chloride  Allergies:    Allergies  Allergen Reactions   Lisinopril Swelling   Acetaminophen-Codeine Itching   Propoxyphene Itching    Darvocet   Tape Rash    Plastic   Tramadol Itching, Nausea And Vomiting and Rash    Social History:   Social History   Socioeconomic History   Marital status: Single    Spouse name: Not on file   Number of children: Not on file   Years of education: Not on file   Highest education level: Not on file  Occupational History   Not on file  Tobacco Use   Smoking status: Every Anthony    Packs/Anthony: 0.10    Years: 30.00    Total pack years: 3.00    Types: Cigarettes   Smokeless tobacco: Never   Tobacco comments:    5 cigs per Anthony  Vaping Use   Vaping Use: Never used  Substance and Sexual Activity   Alcohol use: Yes    Alcohol/week: 3.0 standard drinks of alcohol    Types: 3 Standard drinks or equivalent per week    Comment: beer- ocasional   Drug use: No   Sexual activity: Not Currently    Birth control/protection: None  Other Topics Concern   Not on file  Social History  Narrative   ** Merged History Encounter **       Social Determinants of Health   Financial Resource Strain: Medium Risk (02/27/2021)   Overall Financial Resource Strain (  CARDIA)    Difficulty of Paying Living Expenses: Somewhat hard  Food Insecurity: Food Insecurity Present (01/08/2021)   Hunger Vital Sign    Worried About Running Out of Food in the Last Year: Never true    Ran Out of Food in the Last Year: Sometimes true  Transportation Needs: No Transportation Needs (01/08/2021)   PRAPARE - Hydrologist (Medical): No    Lack of Transportation (Non-Medical): No  Physical Activity: Not on file  Stress: Not on file  Social Connections: Socially Isolated (11/28/2019)   Social Connection and Isolation Panel [NHANES]    Frequency of Communication with Friends and Family: More than three times a week    Frequency of Social Gatherings with Friends and Family: More than three times a week    Attends Religious Services: Never    Marine scientist or Organizations: No    Attends Music therapist: Not on file    Marital Status: Never married  Human resources officer Violence: Not on file    Family History:    Family History  Problem Relation Age of Onset   Hypertension Mother    Cancer Father    Hypertension Father    Breast cancer Maternal Aunt 18   Heart attack Other      ROS:  Please see the history of present illness.  Constitutional: Negative for night sweats, unintentional weight loss. May have had subjective fevers/chills a few days ago HENT: Negative for ear pain and hearing loss.   Eyes: Negative for loss of vision and eye pain.  Respiratory:  Positive for SOB, cough, sputum as per HPI.  Cardiovascular: See HPI. Gastrointestinal: Negative for abdominal pain, melena, and hematochezia.  Genitourinary: Negative for dysuria and hematuria.  Musculoskeletal: Negative for falls and myalgias.  Skin: Negative for itching and rash.   Neurological: Negative for focal weakness, focal sensory changes and loss of consciousness.  Endo/Heme/Allergies: Does bruise/bleed easily.   All other ROS reviewed and negative.     Physical Exam/Data:   Vitals:   02/19/22 0717 02/19/22 0923 02/19/22 1106 02/19/22 1737  BP: 99/74 104/82 102/88 102/83  Pulse: (!) 107  (!) 107 (!) 109  Resp: '20 19 20 18  '$ Temp: 97.6 F (36.4 C) 97.6 F (36.4 C) 97.6 F (36.4 C) 98.1 F (36.7 C)  TempSrc: Oral  Oral Oral  SpO2: 97%  94% 94%  Weight:  80.4 kg    Height:        Intake/Output Summary (Last 24 hours) at 02/19/2022 1817 Last data filed at 02/19/2022 1814 Gross per 24 hour  Intake 680 ml  Output 1140 ml  Net -460 ml      02/19/2022    9:23 AM 02/18/2022    3:56 PM 01/13/2022    9:22 AM  Last 3 Weights  Weight (lbs) 177 lb 4 oz 179 lb 0.2 oz 179 lb 3.2 oz  Weight (kg) 80.4 kg 81.2 kg 81.285 kg     Body mass index is 29.5 kg/m.  General:  Well nourished, well developed, in no acute distress HEENT: normal Neck: difficult to assess JVD 2/2 body habitus Vascular: Distal pulses 2+ bilaterally Cardiac:  normal S1, S2 with crisp mechanical valve sound; RRR; 2/6 systolic ejection murmur Lungs:  rales at bilateral bases, tight air movement without clear wheezing Abd: soft, nontender, no hepatomegaly  Ext: no edema Musculoskeletal:  No deformities, BUE and BLE strength normal and equal Skin: warm and dry  Neuro:  no focal abnormalities noted Psych:  Normal affect   EKG:  The EKG was personally reviewed and demonstrates:  sinus tachycardia vs 2:1 flutter Telemetry:  Telemetry was personally reviewed and demonstrates:  sinus tachycardia vs 2:1 flutter  Relevant CV Studies: Echo 02/18/22 1. Left ventricular ejection fraction, by estimation, is 35 to 40%. The  left ventricle has moderately decreased function. The left ventricle  demonstrates global hypokinesis. Left ventricular diastolic function could  not be evaluated.   2. Right  ventricular systolic function is severely reduced. The right  ventricular size is severely enlarged. There is normal pulmonary artery  systolic pressure. The estimated right ventricular systolic pressure is  52.7 mmHg.   3. Left atrial size was severely dilated.   4. Right atrial size was severely dilated.   5. Prosthetic disc motion appears normal. Gradients are probably  increased due to tachycardia. The mitral valve has been repaired/replaced.  No evidence of mitral valve regurgitation. No evidence of mitral stenosis.  The mean mitral valve gradient is 8.0  mmHg with average heart rate of 107 bpm. There is a mechanical valve  present in the mitral position.   6. The tricuspid valve is abnormal. Tricuspid valve regurgitation is  severe.   7. The aortic valve is tricuspid. Aortic valve regurgitation is not  visualized. No aortic stenosis is present.   8. The inferior vena cava is dilated in size with <50% respiratory  variability, suggesting right atrial pressure of 15 mmHg.  R/LHC 01/09/21  Mid RCA lesion is 100% stenosed.   Ost Cx to Prox Cx lesion is 30% stenosed.   Dist Cx lesion is 30% stenosed.   Dist LAD lesion is 40% stenosed.   Findings:   Ao = 157/85 (114) LV = 150/20 RA = 9 RV = 51/9 PA = 48/19 (32) PCW = 14 Fick cardiac output/index = 5.4/2.9 PVR = 3.3 WU FA sat = 95% PA sat = 73%, 69%   Assessment: 1v CAD with chronic occlusion of mRCA Relatively well-compensated filling pressures with mild pulmonary HTN Normal cardiac output   Difficult coronary ostia to engage due to tortuous subclavian artery. JL 3.5 use for left system LCB used for the RCA which had a high anterior takeoff   Plan/Discussion:    Medical therapy.   Laboratory Data:  High Sensitivity Troponin:   Recent Labs  Lab 02/04/22 0934 02/18/22 0217 02/18/22 0519  TROPONINIHS 15 18* 18*     Chemistry Recent Labs  Lab 02/18/22 0217 02/18/22 0355 02/18/22 0519 02/19/22 0551  NA 136  138 136 135  K 3.9 3.2* 3.5 3.2*  CL 96*  --  97* 101  CO2 21*  --  21* 24  GLUCOSE 89  --  117* 111*  BUN 29*  --  29* 40*  CREATININE 1.94*  --  1.89* 1.89*  CALCIUM 9.4  --  9.2 8.4*  MG  --   --  2.0 2.3  GFRNONAA 27*  --  28* 28*  ANIONGAP 19*  --  18* 10    Recent Labs  Lab 02/18/22 0217  PROT 7.2  ALBUMIN 3.2*  AST 35  ALT 17  ALKPHOS 139*  BILITOT 1.7*   Lipids No results for input(s): "CHOL", "TRIG", "HDL", "LABVLDL", "LDLCALC", "CHOLHDL" in the last 168 hours.  Hematology Recent Labs  Lab 02/18/22 0217 02/18/22 0355 02/18/22 0519  WBC 9.0  --  7.1  RBC 3.97  --  3.98  HGB 12.3 12.6 12.0  HCT 37.9  37.0 38.2  MCV 95.5  --  96.0  MCH 31.0  --  30.2  MCHC 32.5  --  31.4  RDW 13.8  --  13.7  PLT 314  --  305   Thyroid No results for input(s): "TSH", "FREET4" in the last 168 hours.  BNP Recent Labs  Lab 02/18/22 0217  BNP 367.0*    DDimer No results for input(s): "DDIMER" in the last 168 hours.   Radiology/Studies:  ECHOCARDIOGRAM COMPLETE  Result Date: 02/18/2022    ECHOCARDIOGRAM REPORT   Patient Name:   NAO LINZ Date of Exam: 02/18/2022 Medical Rec #:  [id number]     Height:       65.0 in Accession #:    [id number]    Weight:       179.2 lb Date of Birth:  July 11, 1950      BSA:          1.888 m Patient Age:    15 years      BP:           136/111 mmHg Patient Gender: F             HR:           107 bpm. Exam Location:  Inpatient Procedure: 2D Echo, Cardiac Doppler and Color Doppler Indications:    CHF  History:        Patient has prior history of Echocardiogram examinations, most                 recent 08/01/2021. CHF, CAD, COPD; Risk Factors:Hypertension and                 Dyslipidemia.                  Mitral Valve: mechanical valve valve is present in the mitral                 position.  Sonographer:    Memory Argue Referring Phys: [id number] [place]  1. Left ventricular ejection fraction, by estimation, is 35 to 40%. The left ventricle  has moderately decreased function. The left ventricle demonstrates global hypokinesis. Left ventricular diastolic function could not be evaluated.  2. Right ventricular systolic function is severely reduced. The right ventricular size is severely enlarged. There is normal pulmonary artery systolic pressure. The estimated right ventricular systolic pressure is 28.3 mmHg.  3. Left atrial size was severely dilated.  4. Right atrial size was severely dilated.  5. Prosthetic disc motion appears normal. Gradients are probably increased due to tachycardia. The mitral valve has been repaired/replaced. No evidence of mitral valve regurgitation. No evidence of mitral stenosis. The mean mitral valve gradient is 8.0 mmHg with average heart rate of 107 bpm. There is a mechanical valve present in the mitral position.  6. The tricuspid valve is abnormal. Tricuspid valve regurgitation is severe.  7. The aortic valve is tricuspid. Aortic valve regurgitation is not visualized. No aortic stenosis is present.  8. The inferior vena cava is dilated in size with <50% respiratory variability, suggesting right atrial pressure of 15 mmHg. Comparison(s): Prior images reviewed side by side. The left ventricular function is unchanged. The right ventricular systolic function is significantly worse. Tricuspid regurgitation is substantially worse. Although mitral prosthesis gradients are higher, this is probably explained by tachycardia, rather than prosthetic valve stenosis/dysfunction. FINDINGS  Left Ventricle: Left ventricular ejection fraction, by estimation, is 35 to 40%. The left ventricle has moderately decreased  function. The left ventricle demonstrates global hypokinesis. The left ventricular internal cavity size was normal in size. There is no left ventricular hypertrophy. Abnormal (paradoxical) septal motion consistent with post-operative status. Left ventricular diastolic function could not be evaluated due to mitral valve replacement.  Left ventricular diastolic function could not be evaluated. Right Ventricle: The right ventricular size is severely enlarged. Right vetricular wall thickness was not well visualized. Right ventricular systolic function is severely reduced. There is normal pulmonary artery systolic pressure. The tricuspid regurgitant velocity is 2.11 m/s, and with an assumed right atrial pressure of 15 mmHg, the estimated right ventricular systolic pressure is 67.8 mmHg. Left Atrium: Left atrial size was severely dilated. Right Atrium: Right atrial size was severely dilated. Pericardium: There is no evidence of pericardial effusion. Mitral Valve: Prosthetic disc motion appears normal. Gradients are probably increased due to tachycardia. The mitral valve has been repaired/replaced. No evidence of mitral valve regurgitation. There is a mechanical valve present in the mitral position. No evidence of mitral valve stenosis. MV peak gradient, 12.8 mmHg. The mean mitral valve gradient is 8.0 mmHg with average heart rate of 107 bpm. Tricuspid Valve: Dilated tricuspid annulus with severe malcoaptation. The tricuspid valve is abnormal. Tricuspid valve regurgitation is severe. Aortic Valve: The aortic valve is tricuspid. Aortic valve regurgitation is not visualized. No aortic stenosis is present. Aortic valve mean gradient measures 2.0 mmHg. Aortic valve peak gradient measures 3.5 mmHg. Aortic valve area, by VTI measures 1.76 cm. Pulmonic Valve: The pulmonic valve was normal in structure. Pulmonic valve regurgitation is not visualized. Aorta: The aortic root is normal in size and structure. Venous: The inferior vena cava is dilated in size with less than 50% respiratory variability, suggesting right atrial pressure of 15 mmHg. IAS/Shunts: No atrial level shunt detected by color flow Doppler.  LEFT VENTRICLE PLAX 2D LVIDd:         4.20 cm LVIDs:         3.30 cm LV PW:         1.10 cm LV IVS:        1.10 cm LVOT diam:     2.00 cm LV SV:          32 LV SV Index:   17 LVOT Area:     3.14 cm  LV Volumes (MOD) LV vol d, MOD A4C: 94.2 ml LV vol s, MOD A4C: 54.8 ml LV SV MOD A4C:     94.2 ml RIGHT VENTRICLE RV S prime:     8.05 cm/s TAPSE (M-mode): 1.6 cm LEFT ATRIUM              Index        RIGHT ATRIUM           Index LA Vol (A2C):   100.2 ml 53.05 ml/m  RA Area:     31.10 cm LA Vol (A4C):   70.7 ml  37.45 ml/m  RA Volume:   111.00 ml 58.79 ml/m LA Biplane Vol: 80.5 ml  42.64 ml/m  AORTIC VALVE AV Area (Vmax):    2.18 cm AV Area (Vmean):   1.90 cm AV Area (VTI):     1.76 cm AV Vmax:           93.40 cm/s AV Vmean:          68.400 cm/s AV VTI:            0.180 m AV Peak Grad:      3.5 mmHg AV Mean  Grad:      2.0 mmHg LVOT Vmax:         64.90 cm/s LVOT Vmean:        41.300 cm/s LVOT VTI:          0.101 m LVOT/AV VTI ratio: 0.56  AORTA Ao Root diam: 3.50 cm MITRAL VALVE              TRICUSPID VALVE MV Area VTI:  1.35 cm    TR Peak grad:   17.8 mmHg MV Peak grad: 12.8 mmHg   TR Vmax:        211.00 cm/s MV Mean grad: 8.0 mmHg MV Vmax:      1.79 m/s    SHUNTS MV Vmean:     137.0 cm/s  Systemic VTI:  0.10 m                           Systemic Diam: 2.00 cm Sanda Klein Anthony Electronically signed by Sanda Klein Anthony Signature Date/Time: 02/18/2022/3:06:18 PM    Final    US RENAL  Result Date: 02/18/2022 CLINICAL DATA:  Acute kidney injury. EXAM: RENAL / URINARY TRACT ULTRASOUND COMPLETE COMPARISON:  None Available. FINDINGS: Right Kidney: Renal measurements: 12.3 x 5.2 x 5.4 cm = volume: 182 mL. Echogenicity within normal limits. No mass or hydronephrosis visualized. Left Kidney: Renal measurements: 10.6 x 5.7 x 4.8 cm = volume: 151.6 mL. Echogenicity within normal limits. No mass or hydronephrosis visualized. Bladder: Appears normal for degree of bladder distention. Other: Ascites noted within the right upper quadrant of the abdomen. IMPRESSION: 1. Normal appearance of the kidneys. No mass or hydronephrosis identified. 2. Right upper quadrant ascites.  Electronically Signed   By: Kerby Moors M.D.   On: 02/18/2022 06:17   DG Chest Portable 1 View  Result Date: 02/18/2022 CLINICAL DATA:  Shortness of breath. EXAM: PORTABLE CHEST 1 VIEW COMPARISON:  02/04/2022. FINDINGS: Heart is enlarged and the mediastinal contour stable. The pulmonary vasculature is prominent. Atherosclerotic calcification of the aorta is noted. Sternotomy wires are present over the midline and a prosthetic cardiac valve is noted. Interstitial prominence is present bilaterally. No consolidation, effusion, or pneumothorax. Surgical clips are present in the left axilla. IMPRESSION: 1. Cardiomegaly with prominent pulmonary vasculature. 2. Interstitial prominence bilaterally, possible edema or infiltrate. Electronically Signed   By: Brett Fairy M.D.   On: 02/18/2022 02:40     Assessment and Plan:   Acute on chronic systolic and diastolic heart failure Mechanical MVR History of PE, reduced RV function, severe TR -I personally reviewed her echo. Tricuspid valve does not coapt, leading to severe TR. Suspect PASP not accurate due to equalization of RV/RA pressures. Mechanical mitral valve appears stable, with mean gradient of 8 mmHg at 107 bpm. Severe biatrial enlargement. Abnormal septal motion c/w RV pressure/volume overload. Severe reduction in RV function.  -pharmacy managing coumadin for mechanical valve, INR goal 2.5-3.5 -home HF regimen: farxiga 10 mg daily, furosemide 40 mg (was BID for short time prior to admission), hydralazine 25 mg TID, imdur 30 mg daily, metoprolol succinate 25 mg daily, simvastatin 20 mg daily, spironolactine 25 mg daily. Wilder Glade and spironolactone currently on hold due to acute kidney injury -her EF has varied over the last few echoes. She denies chest pain. hsTn low and flat. Given her presentation and her mechanical valve (which would require bridging), I think it is reasonable to hold off on cath at this time. She has known  CAD as below -would  continue diuresis. Admission weight 81.2 kg, weight today 80.4 kg. Last clinic weight 76.9 kg.  CAD -mRCA occluded. Reported prior history of CABG, but no grafts mentioned on cath report. I personally reviewed the films. I also reviewed her three most recent Cts and cannot clearly see a graft. -not on antiplatelet as she is on coumadin -on simvastatin. Will check cholesterol tomorrow. Would consider intensifying if LDL >70.  Tobacco use: advised cessation  Risk Assessment/Risk Scores:        New York Heart Association (NYHA) Functional Class NYHA Class IV        For questions or updates, please contact [place] Please consult [email or web address] for contact info under    Signed, Buford Dresser, Anthony  02/19/2022 6:17 PM

## 2022-02-19 NOTE — TOC Initial Note (Signed)
Transition of Care [facility]) - Initial/Assessment Note    Patient Details  Name: Kim Anthony [name]: [id number] Date of Birth: 1951/01/04  Transition of Care [facility]) CM/SW Contact:    Zenon Mayo, RN Phone Number: 02/19/2022, 3:39 PM  Clinical Narrative:                 From home with daughter, [name],  who is her support system., she has PCP , Iona Coach, internal medicine clinic, she uses the [facility] on [address], daughter will transport her home at [employer].  She states she has no issues with her medications, she weighs herself but states she needs to do this more often, the same with taking her bp, she eats a no sodium diet, she does not have any HH services in place at this time.  She has shower chair, and neb machine at home with INR machine.  TOC will continue to follow for dc needs.  Expected Discharge Plan: Home/Self Care Barriers to Discharge: Continued Medical Work up   Patient Goals and CMS Choice Patient states their goals for this hospitalization and ongoing recovery are:: return home   Choice offered to / list presented to : NA  Expected Discharge Plan and Services Expected Discharge Plan: Home/Self Care In-house Referral: NA Discharge Planning Services: CM Consult Post Acute Care Choice: NA Living arrangements for the past 2 months: Single Family Home                   DME Agency: NA       HH Arranged: NA          Prior Living Arrangements/Services Living arrangements for the past 2 months: Single Family Home Lives with:: Adult Children Patient language and need for interpreter reviewed:: Yes Do you feel safe going back to the place where you live?: Yes      Need for Family Participation in Patient Care: Yes (Comment) Care giver support system in place?: Yes (comment) Current home services: DME (shower chair, neb machine, INR machine) Criminal Activity/Legal Involvement Pertinent to Current Situation/Hospitalization: No - Comment as  needed  Activities of Daily Living Home Assistive Devices/Equipment: None ADL Screening (condition at time of admission) Patient's cognitive ability adequate to safely complete daily activities?: Yes Is the patient deaf or have difficulty hearing?: No Does the patient have difficulty seeing, even when wearing glasses/contacts?: No Does the patient have difficulty concentrating, remembering, or making decisions?: No Patient able to express need for assistance with ADLs?: Yes Does the patient have difficulty dressing or bathing?: Yes Independently performs ADLs?: No Communication: Independent Dressing (OT): Independent Grooming: Independent Feeding: Independent Bathing: Needs assistance Is this a change from baseline?: Change from baseline, expected to last <3 days Toileting: Needs assistance Is this a change from baseline?: Change from baseline, expected to last >3days In/Out Bed: Needs assistance Is this a change from baseline?: Change from baseline, expected to last >3 days Walks in Home: Needs assistance Is this a change from baseline?: Change from baseline, expected to last >3 days Does the patient have difficulty walking or climbing stairs?: Yes Weakness of Legs: Both Weakness of Arms/Hands: None  Permission Sought/Granted                  Emotional Assessment Appearance:: Appears stated age Attitude/Demeanor/Rapport: Engaged Affect (typically observed): Appropriate Orientation: : Oriented to Self, Oriented to Place, Oriented to  Time, Oriented to Situation Alcohol / Substance Use: Not Applicable Psych Involvement: No (comment)  Admission diagnosis:  Acute pulmonary edema (HCC) [J81.0] Respiratory distress [U93.23] Combined systolic and diastolic congestive heart failure ([place]) [F57.32] Combined systolic and diastolic heart failure (HCC) [I50.40] CHF (congestive heart failure) ([place]) [I50.9] Patient Active Problem List   Diagnosis Date Noted   Combined systolic  and diastolic heart failure ([place]) 02/19/2022   CHF (congestive heart failure) ([place]) 02/19/2022   Combined systolic and diastolic congestive heart failure ([place]) 02/18/2022   Acute pulmonary edema (HCC)    Edema of both lower extremities 01/13/2022   Hypotension 01/13/2022   Subacute cough 11/28/2021   COPD exacerbation ([place]) 11/05/2021   Conjunctival hemorrhage of left eye 06/25/2021   Osteoarthritis of left knee 04/24/2021   Seronegative inflammatory arthritis 02/17/2021   High risk medication use 02/17/2021   Elevated transaminase level 01/27/2021   Acute idiopathic gout of multiple sites 09/30/2020   Chronic pain syndrome 01/17/2020   Monoarthritis of wrist 10/18/2019   Ganglion cyst 09/21/2019   Hip pain 08/23/2019   Thoracic ascending aortic aneurysm ([place]) 07/05/2019   Anemia 02/16/2019   Breast cancer ([place])    Acquired absence of left breast 12/20/2018   COPD (chronic obstructive pulmonary disease) ([place]) 11/25/2017   S/P MVR (mitral valve replacement)    Chronic anticoagulation    Tobacco use disorder 02/25/2017   Lymphadenopathy, mediastinal 02/25/2017   Essential hypertension 02/01/2016   Allergic rhinitis 02/01/2016   Healthcare maintenance 01/30/2016   Chronic low back pain 07/22/2015   Pulmonary embolism ([place]) 07/11/2015   Chronic combined systolic and diastolic heart failure ([place]) 07/11/2015   Headache, common migraine 07/11/2015   HLD (hyperlipidemia) 07/11/2015   PCP:  Iona Coach, MD Pharmacy:   [facility] [address] [place] [address] Phone: [phone] Fax: [phone]     Social Determinants of Health (SDOH) Interventions    Readmission Risk Interventions    02/19/2022    3:36 PM  Readmission Risk Prevention Plan  Transportation Screening Complete  PCP or Specialist Appt within 3-5 Days Complete  HRI or [place] Complete  Social Work Consult for [place] Planning/Counseling Complete  Palliative Care Screening Not Applicable  Medication Review Press photographer) Complete

## 2022-02-19 NOTE — Discharge Instructions (Addendum)
Dear Kim Anthony, thank you for trusting [place] with your care. We treated you in the hospital for heart failure. Please take  continue to take the amiodarone. Take one '400mg'$  dose 02/26/22. Take '400mg'$  amiodarone twice on 02/27/22. Take '200mg'$  amiodarone twice daily for seven days starting on the 9th. Take '200mg'$  amiodarone daily after that. Stop lasix. We have prescribed Torsemide '20mg'$  daily instead. Please do not start the Torsemide until you are seen in clinic. Continue taking your home hydralazine, imdur, metoprolol, spironolactone, and farxiga.  Take only '5mg'$  of Warfarin daily. You are scheduled for a hospital follow up with the [facility] clinic on Monday Sept 11th. You have an appointment with Dr. Elie Confer to check your INR Monday, Sept. 11th at 2:00pm.   Information on my medicine - Coumadin   (Warfarin) --You were taking this medication prior to this hospital admission.    This medication education was reviewed with me or my healthcare representative as part of my discharge preparation.   Why was Coumadin prescribed for you? Coumadin was prescribed for you because you have a blood clot or a medical condition that can cause an increased risk of forming blood clots. Blood clots can cause serious health problems by blocking the flow of blood to the heart, lung, or brain. Coumadin can prevent harmful blood clots from forming. As a reminder your indication for Coumadin is:  Blood Clot Prevention after Heart Valve Surgery (history of mechanical mitral valve replacement)  What test will check on my response to Coumadin? While on Coumadin (warfarin) you will need to have an INR test regularly to ensure that your dose is keeping you in the desired range. The INR (international normalized ratio) number is calculated from the result of the laboratory test called prothrombin time (PT).  If an INR APPOINTMENT HAS NOT ALREADY BEEN MADE FOR YOU please schedule an appointment to have this lab work done by your health care  provider within 7 days. Your INR goal is usually a number between:  2 to 3 or your provider may give you a more narrow range like 2-2.5.  Ask your health care provider during an office visit what your goal INR is.  What  do you need to  know  About  COUMADIN? Take Coumadin (warfarin) exactly as prescribed by your healthcare provider about the same time each day.  DO NOT stop taking without talking to the doctor who prescribed the medication.  Stopping without other blood clot prevention medication to take the place of Coumadin may increase your risk of developing a new clot or stroke.  Get refills before you run out.  What do you do if you miss a dose? If you miss a dose, take it as soon as you remember on the same day then continue your regularly scheduled regimen the next day.  Do not take two doses of Coumadin at the same time.  Important Safety Information A possible side effect of Coumadin (Warfarin) is an increased risk of bleeding. You should call your healthcare provider right away if you experience any of the following: Bleeding from an injury or your nose that does not stop. Unusual colored urine (red or dark brown) or unusual colored stools (red or black). Unusual bruising for unknown reasons. A serious fall or if you hit your head (even if there is no bleeding).  Some foods or medicines interact with Coumadin (warfarin) and might alter your response to warfarin. To help avoid this: Eat a balanced diet, maintaining a consistent  amount of Vitamin K. Notify your provider about major diet changes you plan to make. Avoid alcohol or limit your intake to 1 drink for women and 2 drinks for men per day. (1 drink is 5 oz. wine, 12 oz. beer, or 1.5 oz. liquor.)  Make sure that ANY health care provider who prescribes medication for you knows that you are taking Coumadin (warfarin).  Also make sure the healthcare provider who is monitoring your Coumadin knows when you have started a new  medication including herbals and non-prescription products.  Coumadin (Warfarin)  Major Drug Interactions  Increased Warfarin Effect Decreased Warfarin Effect  Alcohol (large quantities) Antibiotics (esp. Septra/Bactrim, Flagyl, Cipro) Amiodarone (Cordarone) Aspirin (ASA) Cimetidine (Tagamet) Megestrol (Megace) NSAIDs (ibuprofen, naproxen, etc.) Piroxicam (Feldene) Propafenone (Rythmol SR) Propranolol (Inderal) Isoniazid (INH) Posaconazole (Noxafil) Barbiturates (Phenobarbital) Carbamazepine (Tegretol) Chlordiazepoxide (Librium) Cholestyramine (Questran) Griseofulvin Oral Contraceptives Rifampin Sucralfate (Carafate) Vitamin K   Coumadin (Warfarin) Major Herbal Interactions  Increased Warfarin Effect Decreased Warfarin Effect  Garlic Ginseng Ginkgo biloba Coenzyme Q10 Green tea St. John's wort    Coumadin (Warfarin) FOOD Interactions  Eat a consistent number of servings per week of foods HIGH in Vitamin K (1 serving =  cup)  Collards (cooked, or boiled & drained) Kale (cooked, or boiled & drained) Mustard greens (cooked, or boiled & drained) Parsley *serving size only =  cup Spinach (cooked, or boiled & drained) Swiss chard (cooked, or boiled & drained) Turnip greens (cooked, or boiled & drained)  Eat a consistent number of servings per week of foods MEDIUM-HIGH in Vitamin K (1 serving = 1 cup)  Asparagus (cooked, or boiled & drained) Broccoli (cooked, boiled & drained, or raw & chopped) Brussel sprouts (cooked, or boiled & drained) *serving size only =  cup Lettuce, raw (green leaf, endive, romaine) Spinach, raw Turnip greens, raw & chopped   These websites have more information on Coumadin (warfarin):  FailFactory.se; VeganReport.com.au;

## 2022-02-19 NOTE — Progress Notes (Signed)
Will continue to monitor  02/19/22 0717  Assess: MEWS Score  Temp 97.6 F (36.4 C)  BP 99/74  MAP (mmHg) 83  Pulse Rate (!) 107  ECG Heart Rate (!) 107  Resp 20  SpO2 97 %  Assess: MEWS Score  MEWS Temp 0  MEWS Systolic 1  MEWS Pulse 1  MEWS RR 0  MEWS LOC 0  MEWS Score 2  MEWS Score Color Yellow  Assess: if the MEWS score is Yellow or Red  Were vital signs taken at a resting state? Yes  Focused Assessment Change from prior assessment (see assessment flowsheet)  Does the patient meet 2 or more of the SIRS criteria? No  MEWS guidelines implemented *See Row Information* Yes  Treat  MEWS Interventions Administered scheduled meds/treatments  Pain Scale 0-10  Pain Score 0  Take Vital Signs  Increase Vital Sign Frequency  Yellow: Q 2hr X 2 then Q 4hr X 2, if remains yellow, continue Q 4hrs  Escalate  MEWS: Escalate Yellow: discuss with charge nurse/RN and consider discussing with provider and RRT  Notify: Charge Nurse/RN  Name of Charge Nurse/RN Notified Beverlee Nims  Date Charge Nurse/RN Notified 02/19/22  Time Charge Nurse/RN Notified 0736  Document  Patient Outcome Stabilized after interventions  Progress note created (see row info) Yes  Assess: SIRS CRITERIA  SIRS Temperature  0  SIRS Pulse 1  SIRS Respirations  0  SIRS WBC 1  SIRS Score Sum  2

## 2022-02-19 NOTE — Progress Notes (Signed)
Mobility Specialist Progress Note:   02/19/22 1639  Mobility  Activity Ambulated with assistance in room;Ambulated with assistance to bathroom  Level of Assistance Standby assist, set-up cues, supervision of patient - no hands on  Assistive Device None  Distance Ambulated (ft) 30 ft  Activity Response Tolerated well  $Mobility charge 1 Mobility   Pt received in bed. No complaints of pain. Left in bed with call bell in reach and all needs met.   [facility] Tzion Wedel Mobility Specialist

## 2022-02-19 NOTE — Evaluation (Signed)
Physical Therapy Evaluation Patient Details Name: Kim Anthony [name]: [id number] DOB: Jul 16, 1950 Today's Date: 02/19/2022  History of Present Illness  Pt is 71 yo female who presented for SOB. PMH includes: chronic HFpEF (last EF 50-55%) with mitral valve replacement on chronic coumadin, COPD, CAD, history of pulmonary embolism.  Clinical Impression  Pt presents to PT with deficits in activity tolerance. Pt is able to ambulate for limited household distances at this time, refusing to mobilize out of hospital room. Pt denies SOB during session while mobilizing on 2L [place], SpO2 not reading on monitor. Pt will benefit from continued aggressive mobilization in an effort to restore the pt's endurance and independence.     Recommendations for follow up therapy are one component of a multi-disciplinary discharge planning process, led by the attending physician.  Recommendations may be updated based on patient status, additional functional criteria and insurance authorization.  Follow Up Recommendations No PT follow up      Assistance Recommended at Discharge PRN  Patient can return home with the following  A little help with bathing/dressing/bathroom;Assistance with cooking/housework;Help with stairs or ramp for entrance    Equipment Recommendations None recommended by PT  Recommendations for Other Services       Functional Status Assessment Patient has had a recent decline in their functional status and demonstrates the ability to make significant improvements in function in a reasonable and predictable amount of time.     Precautions / Restrictions Precautions Precautions: Fall Restrictions Weight Bearing Restrictions: No      Mobility  Bed Mobility Overal bed mobility: Modified Independent                  Transfers Overall transfer level: Needs assistance Equipment used: None Transfers: Sit to/from Stand Sit to Stand: Supervision           General transfer comment:  increased time    Ambulation/Gait Ambulation/Gait assistance: Supervision Gait Distance (Feet): 30 Feet (pt declines ambulation out of room) Assistive device: None Gait Pattern/deviations: Step-through pattern Gait velocity: reduced Gait velocity interpretation: <1.31 ft/sec, indicative of household ambulator   General Gait Details: slowed step-through gait, reduced stride length  Stairs            Wheelchair Mobility    Modified Rankin (Stroke Patients Only)       Balance Overall balance assessment: Mild deficits observed, not formally tested                                           Pertinent Vitals/Pain Pain Assessment Pain Assessment: Faces Faces Pain Scale: Hurts little more Pain Location: R forearm at IV site Pain Descriptors / Indicators: Burning Pain Intervention(s): Monitored during session    Home Living Family/patient expects to be discharged to:: Private residence Living Arrangements: Children Available Help at Discharge: Family;Other (Comment) Type of Home: House Home Access: Stairs to enter Entrance Stairs-Rails: Psychiatric nurse of Steps: 6   Home Layout: One level Home Equipment: None (conflicting report provided to OT, reporting a cane and shower seat earlier in the day)      Prior Function Prior Level of Function : Independent/Modified Independent             Mobility Comments: ambulates without use of an assistive device       Hand Dominance   Dominant Hand: Right    Extremity/Trunk Assessment  Upper Extremity Assessment Upper Extremity Assessment: Defer to OT evaluation    Lower Extremity Assessment Lower Extremity Assessment: Generalized weakness    Cervical / Trunk Assessment Cervical / Trunk Assessment: Normal  Communication   Communication: No difficulties  Cognition Arousal/Alertness: Awake/alert Behavior During Therapy: WFL for tasks assessed/performed Overall Cognitive  Status: Within Functional Limits for tasks assessed                                          General Comments General comments (skin integrity, edema, etc.): pt received on 2L [place], pt denies SOB, SpO2 not reading on monitor during session    Exercises     Assessment/Plan    PT Assessment Patient needs continued PT services  PT Problem List Decreased activity tolerance;Decreased balance;Decreased mobility;Cardiopulmonary status limiting activity       PT Treatment Interventions DME instruction;Gait training;Stair training;Therapeutic activities;Functional mobility training;Balance training;Patient/family education    PT Goals (Current goals can be found in the Care Plan section)  Acute Rehab PT Goals Patient Stated Goal: to return home PT Goal Formulation: With patient Time For Goal Achievement: 03/05/22 Potential to Achieve Goals: Good Additional Goals Additional Goal #1: Pt will score >19/24 on the DGI to indicate a reduced risk for falls    Frequency Min 3X/week     Co-evaluation               AM-PAC PT "6 Clicks" Mobility  Outcome Measure Help needed turning from your back to your side while in a flat bed without using bedrails?: None Help needed moving from lying on your back to sitting on the side of a flat bed without using bedrails?: None Help needed moving to and from a bed to a chair (including a wheelchair)?: A Little Help needed standing up from a chair using your arms (e.g., wheelchair or bedside chair)?: A Little Help needed to walk in hospital room?: A Little Help needed climbing 3-5 steps with a railing? : A Little 6 Click Score: 20    End of Session Equipment Utilized During Treatment: Oxygen Activity Tolerance: Patient tolerated treatment well Patient left: in bed;with call bell/phone within reach Nurse Communication: Mobility status PT Visit Diagnosis: Other abnormalities of gait and mobility (R26.89)    Time: 1345-1401 PT  Time Calculation (min) (ACUTE ONLY): 16 min   Charges:   PT Evaluation $PT Eval Low Complexity: [place], PT, DPT Acute Rehabilitation Office [phone]   Zenaida Niece 02/19/2022, 2:31 PM

## 2022-02-19 NOTE — Progress Notes (Addendum)
.  Subjective: 71 y.o. female living with a PMHx of CHF post MV replacement, COPD, PEs, and hypertension who presented with shortness of breath and admitted for acute decompensated heart failure exacerbation.  Overnight Events: None  Pt reports continued improvement of her dyspnea. Denies any chest pain. States she is having mild pain in her back, which is chronic and unchanged from prior.    Objective:  Vital signs in last 24 hours: Vitals:   02/19/22 0645 02/19/22 0717 02/19/22 0923 02/19/22 1106  BP: 113/76 99/74 104/82 102/88  Pulse:  (!) 107  (!) 107  Resp:  '20 19 20  '$ Temp: 98.2 F (36.8 C) 97.6 F (36.4 C) 97.6 F (36.4 C) 97.6 F (36.4 C)  TempSrc: Oral Oral  Oral  SpO2:  97%  94%  Weight:   80.4 kg   Height:       Weight change:   Intake/Output Summary (Last 24 hours) at 02/19/2022 1356 Last data filed at 02/19/2022 1308 Gross per 24 hour  Intake 440 ml  Output 690 ml  Net -250 ml   Physical Exam Constitutional: alert, laying in bed, in no acute distress  HENT: normocephalic atraumatic Cardiovascular: tachycardic rate, regular rhythm Pulmonary: normal work of breathing,on 2L [place], bibasilar crackles MSK: normal bulk and tone Neurological: alert & oriented x 3 Skin: warm and dry, no LE edema  Psych: normal mood and behavior  Assessment/Plan:  Principal Problem:   Combined systolic and diastolic congestive heart failure (HCC) Active Problems:   Pulmonary embolism (HCC)   Chronic low back pain   Tobacco use disorder   COPD (chronic obstructive pulmonary disease) (HCC)   S/P MVR (mitral valve replacement)   Osteoarthritis of left knee   Combined systolic and diastolic heart failure (HCC)   CHF (congestive heart failure) (HCC)  # Acute on chronic HFrEF exacerbation LVEF 35-40% # Biventricular failure Pt with significant improvement in her SOB. No LE edema noted.  Echocardiogram performed on 08/30 revealed newly reduced EF 35-40% with severe RV  dysfunction and progression of tricuspid regurge. ECG shows interval changes from 1 year ago. Concern for recent undiagnosed MI. RV dysfunction due to [facility] is also possible given hx of COPD and hx PE. Pt has been responding to IV lasix and able to tolerate with stable BP thus far. Breathing comfortably on 2L [place].  - Consult cardiology for possible ischemic work up - IV lasix '80mg'$  x2 daily (hold parameters if BP below 100/70). Will be important to avoid over-diuresis in setting of ride sided failure - Monitor Ins-Outs and collect daily weight - Continue Toprol XL, hydralazine, and Imdur pending cardiology recs - Holding home Farxiga and spironolactone given AKI - Maintain K > 4.0, Mg > 2.0 - PT evaluation  Hx of PE Hx of MV replacement Goal INR 2.5-3.5 given mechanical MV. Follows Dr. Elie Confer. Pt currently on Warfarin per pharmacy. INR increased to 3.2 from 2.2.  - Continue Warfarin per pharmacy - Recheck PT/INR in AM - will follow up with cardiology regarding plans for ischemic eval and if she needs to be transitioned to heparin. If so, she will likely need 5 days for appropriate washout time  AKI on CKD IIIA BUN elevated at 40, up from 29 on arrival. Creatinine unchanged from 1.89. GFR unchanged from 28. Suspect cardiorenal syndrome given patient's transition from HFpEF to HFrEF. She no longer appears grossly volume overloaded after starting IV lasix.  - Continue IV lasix 80 mg x2 as BP tolerates - Recheck  labs in AM  COPD On Stiolto respimat, albuterol, and montelukast at home. Does not follow up with pulmonologist.  No wheezing on exam.  - Continue albuterol and montelukast - Brovana nebulizer and incruse ellipta - Possible pulmonology referral as outpatient  Chronic low back pain Pt continues to complain of pain in her lower back, unchanged from prior. She has self reported history of radiculopathy with sciatic nerve pain. Prescribed Norco 5-'325mg'$  q4h PRN at home. States she has been  doubling up on dose for adequate control prior to admission. - Increased Norco dose to 10-650 mg Q4  Tobacco use disorder Smokes 6 cigarettes a day for the last 30 years - continue nicotine patch  Diet: Heart Healthy, Carb-modified VTE: Coumadin IVF: None,None Code: Full Prior to Admission Living Arrangement: Home, living with daughter Anticipated Discharge Location: Home Barriers to Discharge: CHF exacerbation Dispo: Admit patient to inpatient   Kim Anthony, Medical Student 02/19/2022, 10:55 AM   Delene Ruffini, MD 02/19/2022, 1:56 PM

## 2022-02-19 NOTE — Progress Notes (Signed)
ANTICOAGULATION CONSULT NOTE - follow up  Pharmacy Consult for Coumadin Indication: mechanical mitral valve   Allergies  Allergen Reactions   Lisinopril Swelling   Acetaminophen-Codeine Itching   Propoxyphene Itching    Darvocet   Tape Rash    Plastic   Tramadol Itching, Nausea And Vomiting and Rash    Patient Measurements: Height: '5\' 5"'$  (165.1 cm) Weight: 80.4 kg (177 lb 4 oz) IBW/kg (Calculated) : 57   Vital Signs: Temp: 97.6 F (36.4 C) (08/31 1106) Temp Source: Oral (08/31 1106) BP: 102/88 (08/31 1106) Pulse Rate: 107 (08/31 1106)  Labs: Recent Labs    02/18/22 0217 02/18/22 0355 02/18/22 0519 02/19/22 0551  HGB 12.3 12.6 12.0  --   HCT 37.9 37.0 38.2  --   PLT 314  --  305  --   LABPROT 24.6*  --   --  32.8*  INR 2.2*  --   --  3.2*  CREATININE 1.94*  --  1.89* 1.89*  TROPONINIHS 18*  --  18*  --      Estimated Creatinine Clearance: 28.2 mL/min (A) (by C-G formula based on SCr of 1.89 mg/dL (H)).   Medical History: Past Medical History:  Diagnosis Date   Arthritis    Asthma    Breast cancer ([place]) 2001   Left Breast Cancer   CHF (congestive heart failure) (HCC)    COPD (chronic obstructive pulmonary disease) (HCC)    Coronary artery disease    GERD (gastroesophageal reflux disease)    History of mitral valve replacement with mechanical valve    HLD (hyperlipidemia)    Hypertension    Pulmonary embolism ([place]) 2021    Medications:  No current facility-administered medications on file prior to encounter.   Current Outpatient Medications on File Prior to Encounter  Medication Sig Dispense Refill   albuterol (PROVENTIL) (2.5 MG/3ML) 0.083% nebulizer solution USE 1 VIAL IN NEBULIZER EVERY 6 HOURS AS NEEDED FOR WHEEZING FOR SHORTNESS OF BREATH (Patient taking differently: Take 2.5 mg by nebulization every 6 (six) hours as needed for shortness of breath.) 90 mL 2   albuterol (VENTOLIN HFA) 108 (90 Base) MCG/ACT inhaler Inhale 2 puffs into the lungs  every 6 (six) hours as needed for wheezing or shortness of breath. 8 g 2   diphenhydrAMINE (BENADRYL) 25 MG tablet Take 75 mg by mouth daily as needed for allergies.     docusate sodium (COLACE) 100 MG capsule Take 1 capsule (100 mg total) by mouth 2 (two) times daily as needed for mild constipation. (Patient taking differently: Take 100 mg by mouth daily as needed for mild constipation.) 30 capsule 1   FARXIGA 10 MG TABS tablet Take 1 tablet by mouth once daily 90 tablet 0   fluticasone (FLONASE) 50 MCG/ACT nasal spray Place 1 spray into both nostrils daily as needed for allergies. 9.9 mL 2   furosemide (LASIX) 40 MG tablet Take 1 tablet (40 mg total) by mouth 2 (two) times daily for 5 days. Then return to your normal dose of 1 tablet daily (Patient taking differently: Take 40 mg by mouth daily.) 45 tablet 3   gabapentin (NEURONTIN) 600 MG tablet TAKE 1 & 1/2 (ONE & ONE-HALF) TABLETS BY MOUTH THREE TIMES DAILY FOR  NERVE  PAIN (Patient taking differently: Take 900 mg by mouth 3 (three) times daily. Take 1 and 1/2 tablets by mouth three times a day for nerve pain) 150 tablet 5   hydrALAZINE (APRESOLINE) 25 MG tablet Take 1 tablet (25  mg total) by mouth 3 (three) times daily. 90 tablet 11   HYDROcodone-acetaminophen (NORCO) 5-325 MG tablet Take 1 tablet by mouth every 4 (four) hours as needed for moderate pain. 150 tablet 0   isosorbide mononitrate (IMDUR) 30 MG 24 hr tablet Take 1 tablet (30 mg total) by mouth daily. 90 tablet 2   loratadine (CLARITIN) 10 MG tablet Take 1 tablet (10 mg total) by mouth daily. 90 tablet 0   metoprolol succinate (TOPROL-XL) 25 MG 24 hr tablet Take 1 tablet (25 mg total) by mouth daily. 90 tablet 2   montelukast (SINGULAIR) 10 MG tablet Take 1 tablet (10 mg total) by mouth at bedtime. 90 tablet 2   polyethylene glycol (MIRALAX / GLYCOLAX) 17 g packet Take 17 g by mouth daily. (Patient taking differently: Take 17 g by mouth daily as needed for mild constipation.) 14 each 0    Simethicone (GAS-X PO) Take 1 tablet by mouth daily.     simvastatin (ZOCOR) 20 MG tablet Take 1 tablet (20 mg total) by mouth daily. 90 tablet 2   spironolactone (ALDACTONE) 25 MG tablet Take 1 tablet by mouth once daily (Patient taking differently: Take 25 mg by mouth daily.) 90 tablet 3   Tiotropium Bromide-Olodaterol (STIOLTO RESPIMAT) 2.5-2.5 MCG/ACT AERS INHALE 2 PUFFS BY MOUTH ONCE DAILY (Patient taking differently: Inhale 2 puffs by mouth daily) 4 g 0   warfarin (COUMADIN) 5 MG tablet TAKE 1& 1/2  TABLETS BY MOUTH ONCE DAILY AT 4 PM. (Patient taking differently: Take 5 mg by mouth at bedtime.) 42 tablet 1   famotidine (PEPCID) 20 MG tablet Take 1 tablet by mouth twice daily (Patient not taking: Reported on 02/18/2022) 60 tablet 0   nicotine (NICODERM CQ - DOSED IN MG/24 HOURS) 21 mg/24hr patch Place 1 patch (21 mg total) onto the skin daily. (Patient not taking: Reported on 02/18/2022) 30 patch 2   potassium chloride SA (KLOR-CON M) 20 MEQ tablet Take 2 tablets (40 mEq total) by mouth daily. (Patient taking differently: Take 20 mEq by mouth daily.) 30 tablet 6   Spacer/Aero-Holding Chambers (BREATHERITE COLL SPACER ADULT) MISC 1 applicator by Does not apply route daily. 1 each 0     Assessment: 71 y.o. female with h/o mechanical MVR and PE , on  Coumadin.  Goal INR is 2.5-3.5.   INR on admission was slightly subtherapeutic at 2.2 on 02/18/22.  Noted that pt has had multiple recent dose adjustments to Coumadin regimen prior to admission due to previous supra-therapeutic INR results.    Today INR increased to 3.2 , therapeutic INR, goal 2.5-3.5.  No bleeding reported.  I will reduce dose to 2.5 mg today, since INR increased from 2.2 to 3.2 in one day and recent supratheraputic INRs prior to admission noted per anticoagulation outpatient encounters.   Goal of Therapy:  INR 2.5-3.5 Monitor platelets by anticoagulation protocol: Yes   Plan:  Give Coumadin 2.5 mg today x1 Daily  INR   Thank you for allowing pharmacy to be part of this patients care team.   Nicole Cella, [place] Pharmacist [phone] 02/19/2022,12:57 PM

## 2022-02-20 DIAGNOSIS — N1831 Chronic kidney disease, stage 3a: Secondary | ICD-10-CM | POA: Diagnosis not present

## 2022-02-20 DIAGNOSIS — I484 Atypical atrial flutter: Secondary | ICD-10-CM | POA: Diagnosis not present

## 2022-02-20 DIAGNOSIS — I5082 Biventricular heart failure: Secondary | ICD-10-CM

## 2022-02-20 DIAGNOSIS — N179 Acute kidney failure, unspecified: Secondary | ICD-10-CM | POA: Diagnosis not present

## 2022-02-20 DIAGNOSIS — Z952 Presence of prosthetic heart valve: Secondary | ICD-10-CM | POA: Diagnosis not present

## 2022-02-20 DIAGNOSIS — I5043 Acute on chronic combined systolic (congestive) and diastolic (congestive) heart failure: Secondary | ICD-10-CM | POA: Diagnosis not present

## 2022-02-20 LAB — LIPID PANEL
Cholesterol: 93 mg/dL (ref 0–200)
HDL: 27 mg/dL — ABNORMAL LOW (ref >40)
LDL Cholesterol: 44 mg/dL (ref 0–99)
Total CHOL/HDL Ratio: 3.4 RATIO
Triglycerides: 110 mg/dL (ref <150)
VLDL: 22 mg/dL (ref 0–40)

## 2022-02-20 LAB — BASIC METABOLIC PANEL
Anion gap: 12 (ref 5–15)
BUN: 41 mg/dL — ABNORMAL HIGH (ref 8–23)
CO2: 23 mmol/L (ref 22–32)
Calcium: 8.6 mg/dL — ABNORMAL LOW (ref 8.9–10.3)
Chloride: 99 mmol/L (ref 98–111)
Creatinine, Ser: 1.28 mg/dL — ABNORMAL HIGH (ref 0.44–1.00)
GFR, Estimated: 45 mL/min — ABNORMAL LOW (ref >60)
Glucose, Bld: 99 mg/dL (ref 70–99)
Potassium: 3.1 mmol/L — ABNORMAL LOW (ref 3.5–5.1)
Sodium: 134 mmol/L — ABNORMAL LOW (ref 135–145)

## 2022-02-20 LAB — MAGNESIUM: Magnesium: 2.2 mg/dL (ref 1.7–2.4)

## 2022-02-20 LAB — PROTIME-INR
INR: 3.6 — ABNORMAL HIGH (ref 0.8–1.2)
Prothrombin Time: 35.7 seconds — ABNORMAL HIGH (ref 11.4–15.2)

## 2022-02-20 MED ORDER — DM-GUAIFENESIN ER 30-600 MG PO TB12
1.0000 | ORAL_TABLET | Freq: Two times a day (BID) | ORAL | Status: DC | PRN
  Filled 2022-02-20: qty 1

## 2022-02-20 MED ORDER — GUAIFENESIN-DM 100-10 MG/5ML PO SYRP
5.0000 mL | ORAL_SOLUTION | ORAL | Status: DC | PRN
  Administered 2022-02-20 (×2): 5 mL via ORAL
  Filled 2022-02-20 (×2): qty 5

## 2022-02-20 MED ORDER — POTASSIUM CHLORIDE 20 MEQ PO PACK
40.0000 meq | PACK | Freq: Two times a day (BID) | ORAL | Status: DC
  Administered 2022-02-20 (×2): 40 meq via ORAL
  Filled 2022-02-20 (×2): qty 2

## 2022-02-20 MED ORDER — FLUTICASONE PROPIONATE 50 MCG/ACT NA SUSP
2.0000 | Freq: Two times a day (BID) | NASAL | Status: DC
  Administered 2022-02-20 – 2022-02-25 (×5): 2 via NASAL
  Filled 2022-02-20: qty 16

## 2022-02-20 MED ORDER — TRAZODONE HCL 50 MG PO TABS
50.0000 mg | ORAL_TABLET | Freq: Once | ORAL | Status: AC
  Administered 2022-02-20: 50 mg via ORAL
  Filled 2022-02-20: qty 1

## 2022-02-20 MED ORDER — AMIODARONE HCL 200 MG PO TABS
400.0000 mg | ORAL_TABLET | Freq: Two times a day (BID) | ORAL | Status: DC
  Administered 2022-02-20 – 2022-02-26 (×12): 400 mg via ORAL
  Filled 2022-02-20 (×12): qty 2

## 2022-02-20 MED ORDER — WARFARIN SODIUM 5 MG PO TABS
5.0000 mg | ORAL_TABLET | Freq: Once | ORAL | Status: AC
  Administered 2022-02-20: 5 mg via ORAL
  Filled 2022-02-20: qty 1

## 2022-02-20 NOTE — Progress Notes (Signed)
Rounding Note    Patient Name: KATHELEEN STELLA Date of Encounter: 02/20/2022  [facility] Cardiologist: Dr. Haroldine Laws  Subjective   No acute events overnight. We discussed her telemetry, which has been completely flat (almost no heart rate variability). I dicussed that based on this and review of her recent ECGs, I am concerned she is in atypical atrial flutter. She does not recall being told this before but does endorse having a cardioversion post op (I cannot find records of this). We discussed options for management, see below.  Inpatient Medications    Scheduled Meds:  arformoterol  15 mcg Nebulization BID   And   umeclidinium bromide  1 puff Inhalation Daily   dextromethorphan-guaiFENesin  1 tablet Oral BID   furosemide  80 mg Intravenous BID   hydrALAZINE  25 mg Oral TID   isosorbide mononitrate  30 mg Oral Daily   metoprolol succinate  25 mg Oral Daily   montelukast  10 mg Oral QHS   nicotine  14 mg Transdermal Daily   potassium chloride  40 mEq Oral BID   ramelteon  8 mg Oral QHS   simvastatin  20 mg Oral Daily   Warfarin - Pharmacist Dosing Inpatient   Does not apply q1600   Continuous Infusions:  PRN Meds: albuterol, guaiFENesin-dextromethorphan, HYDROcodone-acetaminophen, polyethylene glycol, sodium chloride   Vital Signs    Vitals:   02/20/22 0026 02/20/22 0113 02/20/22 0342 02/20/22 0743  BP: 95/78 (!) 120/98 116/73   Pulse: (!) 104     Resp: '18 20 18   '$ Temp: 98.6 F (37 C) 98.1 F (36.7 C) 98.1 F (36.7 C)   TempSrc: Oral Oral Oral   SpO2: 92% 92% 93% 94%  Weight:   80.3 kg   Height:        Intake/Output Summary (Last 24 hours) at 02/20/2022 0819 Last data filed at 02/19/2022 2300 Gross per 24 hour  Intake 680 ml  Output 1000 ml  Net -320 ml      02/20/2022    3:42 AM 02/19/2022    9:23 AM 02/18/2022    3:56 PM  Last 3 Weights  Weight (lbs) 177 lb 1.6 oz 177 lb 4 oz 179 lb 0.2 oz  Weight (kg) 80.332 kg 80.4 kg 81.2 kg       Telemetry    Atypical atrial flutter - Personally Reviewed  ECG    Now new since 02/18/22 - Personally Reviewed  Physical Exam   GEN: No acute distress. Now on room air Neck: No JVD appreciated at 45 degrees Cardiac: RRR with crisp mechanical S2, no rubs, or gallops. 2/6 SEM Respiratory: improving rales at bilateral bases GI: Soft, nontender, non-distended  MS: No edema; No deformity. Neuro:  Nonfocal  Psych: Normal affect   Labs    High Sensitivity Troponin:   Recent Labs  Lab 02/04/22 0934 02/18/22 0217 02/18/22 0519  TROPONINIHS 15 18* 18*     Chemistry Recent Labs  Lab 02/18/22 0217 02/18/22 0355 02/18/22 0519 02/19/22 0551 02/20/22 0531  NA 136   < > 136 135 134*  K 3.9   < > 3.5 3.2* 3.1*  CL 96*  --  97* 101 99  CO2 21*  --  21* 24 23  GLUCOSE 89  --  117* 111* 99  BUN 29*  --  29* 40* 41*  CREATININE 1.94*  --  1.89* 1.89* 1.28*  CALCIUM 9.4  --  9.2 8.4* 8.6*  MG  --   --  2.0 2.3 2.2  PROT 7.2  --   --   --   --   ALBUMIN 3.2*  --   --   --   --   AST 35  --   --   --   --   ALT 17  --   --   --   --   ALKPHOS 139*  --   --   --   --   BILITOT 1.7*  --   --   --   --   GFRNONAA 27*  --  28* 28* 45*  ANIONGAP 19*  --  18* 10 12   < > = values in this interval not displayed.    Lipids  Recent Labs  Lab 02/20/22 0531  CHOL 93  TRIG 110  HDL 27*  LDLCALC 44  CHOLHDL 3.4    Hematology Recent Labs  Lab 02/18/22 0217 02/18/22 0355 02/18/22 0519  WBC 9.0  --  7.1  RBC 3.97  --  3.98  HGB 12.3 12.6 12.0  HCT 37.9 37.0 38.2  MCV 95.5  --  96.0  MCH 31.0  --  30.2  MCHC 32.5  --  31.4  RDW 13.8  --  13.7  PLT 314  --  305   Thyroid No results for input(s): "TSH", "FREET4" in the last 168 hours.  BNP Recent Labs  Lab 02/18/22 0217  BNP 367.0*    DDimer No results for input(s): "DDIMER" in the last 168 hours.   Radiology    ECHOCARDIOGRAM COMPLETE  Result Date: 02/18/2022    ECHOCARDIOGRAM REPORT   Patient Name:   RAMIYAH MCCLENAHAN Date of Exam: 02/18/2022 Medical Rec #:  [id number]     Height:       65.0 in Accession #:    [id number]    Weight:       179.2 lb Date of Birth:  23-Feb-1951      BSA:          1.888 m Patient Age:    71 years      BP:           136/111 mmHg Patient Gender: F             HR:           107 bpm. Exam Location:  Inpatient Procedure: 2D Echo, Cardiac Doppler and Color Doppler Indications:    CHF  History:        Patient has prior history of Echocardiogram examinations, most                 recent 08/01/2021. CHF, CAD, COPD; Risk Factors:Hypertension and                 Dyslipidemia.                  Mitral Valve: mechanical valve valve is present in the mitral                 position.  Sonographer:    Memory Argue Referring Phys: [id number] Girard  1. Left ventricular ejection fraction, by estimation, is 35 to 40%. The left ventricle has moderately decreased function. The left ventricle demonstrates global hypokinesis. Left ventricular diastolic function could not be evaluated.  2. Right ventricular systolic function is severely reduced. The right ventricular size is severely enlarged. There is normal pulmonary artery systolic pressure. The estimated right ventricular systolic pressure is 84.1 mmHg.  3. Left atrial size was severely dilated.  4. Right atrial size was severely dilated.  5. Prosthetic disc motion appears normal. Gradients are probably increased due to tachycardia. The mitral valve has been repaired/replaced. No evidence of mitral valve regurgitation. No evidence of mitral stenosis. The mean mitral valve gradient is 8.0 mmHg with average heart rate of 107 bpm. There is a mechanical valve present in the mitral position.  6. The tricuspid valve is abnormal. Tricuspid valve regurgitation is severe.  7. The aortic valve is tricuspid. Aortic valve regurgitation is not visualized. No aortic stenosis is present.  8. The inferior vena cava is dilated in size with <50% respiratory variability,  suggesting right atrial pressure of 15 mmHg. Comparison(s): Prior images reviewed side by side. The left ventricular function is unchanged. The right ventricular systolic function is significantly worse. Tricuspid regurgitation is substantially worse. Although mitral prosthesis gradients are higher, this is probably explained by tachycardia, rather than prosthetic valve stenosis/dysfunction. FINDINGS  Left Ventricle: Left ventricular ejection fraction, by estimation, is 35 to 40%. The left ventricle has moderately decreased function. The left ventricle demonstrates global hypokinesis. The left ventricular internal cavity size was normal in size. There is no left ventricular hypertrophy. Abnormal (paradoxical) septal motion consistent with post-operative status. Left ventricular diastolic function could not be evaluated due to mitral valve replacement. Left ventricular diastolic function could not be evaluated. Right Ventricle: The right ventricular size is severely enlarged. Right vetricular wall thickness was not well visualized. Right ventricular systolic function is severely reduced. There is normal pulmonary artery systolic pressure. The tricuspid regurgitant velocity is 2.11 m/s, and with an assumed right atrial pressure of 15 mmHg, the estimated right ventricular systolic pressure is 19.6 mmHg. Left Atrium: Left atrial size was severely dilated. Right Atrium: Right atrial size was severely dilated. Pericardium: There is no evidence of pericardial effusion. Mitral Valve: Prosthetic disc motion appears normal. Gradients are probably increased due to tachycardia. The mitral valve has been repaired/replaced. No evidence of mitral valve regurgitation. There is a mechanical valve present in the mitral position. No evidence of mitral valve stenosis. MV peak gradient, 12.8 mmHg. The mean mitral valve gradient is 8.0 mmHg with average heart rate of 107 bpm. Tricuspid Valve: Dilated tricuspid annulus with severe  malcoaptation. The tricuspid valve is abnormal. Tricuspid valve regurgitation is severe. Aortic Valve: The aortic valve is tricuspid. Aortic valve regurgitation is not visualized. No aortic stenosis is present. Aortic valve mean gradient measures 2.0 mmHg. Aortic valve peak gradient measures 3.5 mmHg. Aortic valve area, by VTI measures 1.76 cm. Pulmonic Valve: The pulmonic valve was normal in structure. Pulmonic valve regurgitation is not visualized. Aorta: The aortic root is normal in size and structure. Venous: The inferior vena cava is dilated in size with less than 50% respiratory variability, suggesting right atrial pressure of 15 mmHg. IAS/Shunts: No atrial level shunt detected by color flow Doppler.  LEFT VENTRICLE PLAX 2D LVIDd:         4.20 cm LVIDs:         3.30 cm LV PW:         1.10 cm LV IVS:        1.10 cm LVOT diam:     2.00 cm LV SV:         32 LV SV Index:   17 LVOT Area:     3.14 cm  LV Volumes (MOD) LV vol d, MOD A4C: 94.2 ml LV vol s, MOD A4C: 54.8 ml LV SV  MOD A4C:     94.2 ml RIGHT VENTRICLE RV S prime:     8.05 cm/s TAPSE (M-mode): 1.6 cm LEFT ATRIUM              Index        RIGHT ATRIUM           Index LA Vol (A2C):   100.2 ml 53.05 ml/m  RA Area:     31.10 cm LA Vol (A4C):   70.7 ml  37.45 ml/m  RA Volume:   111.00 ml 58.79 ml/m LA Biplane Vol: 80.5 ml  42.64 ml/m  AORTIC VALVE AV Area (Vmax):    2.18 cm AV Area (Vmean):   1.90 cm AV Area (VTI):     1.76 cm AV Vmax:           93.40 cm/s AV Vmean:          68.400 cm/s AV VTI:            0.180 m AV Peak Grad:      3.5 mmHg AV Mean Grad:      2.0 mmHg LVOT Vmax:         64.90 cm/s LVOT Vmean:        41.300 cm/s LVOT VTI:          0.101 m LVOT/AV VTI ratio: 0.56  AORTA Ao Root diam: 3.50 cm MITRAL VALVE              TRICUSPID VALVE MV Area VTI:  1.35 cm    TR Peak grad:   17.8 mmHg MV Peak grad: 12.8 mmHg   TR Vmax:        211.00 cm/s MV Mean grad: 8.0 mmHg MV Vmax:      1.79 m/s    SHUNTS MV Vmean:     137.0 cm/s  Systemic VTI:   0.10 m                           Systemic Diam: 2.00 cm Sanda Klein MD Electronically signed by Sanda Klein MD Signature Date/Time: 02/18/2022/3:06:18 PM    Final     Cardiac Studies   Echo 02/18/22 1. Left ventricular ejection fraction, by estimation, is 35 to 40%. The  left ventricle has moderately decreased function. The left ventricle  demonstrates global hypokinesis. Left ventricular diastolic function could  not be evaluated.   2. Right ventricular systolic function is severely reduced. The right  ventricular size is severely enlarged. There is normal pulmonary artery  systolic pressure. The estimated right ventricular systolic pressure is  98.3 mmHg.   3. Left atrial size was severely dilated.   4. Right atrial size was severely dilated.   5. Prosthetic disc motion appears normal. Gradients are probably  increased due to tachycardia. The mitral valve has been repaired/replaced.  No evidence of mitral valve regurgitation. No evidence of mitral stenosis.  The mean mitral valve gradient is 8.0  mmHg with average heart rate of 107 bpm. There is a mechanical valve  present in the mitral position.   6. The tricuspid valve is abnormal. Tricuspid valve regurgitation is  severe.   7. The aortic valve is tricuspid. Aortic valve regurgitation is not  visualized. No aortic stenosis is present.   8. The inferior vena cava is dilated in size with <50% respiratory  variability, suggesting right atrial pressure of 15 mmHg.   R/LHC 01/09/21  Mid RCA lesion is 100% stenosed.  Ost Cx to Prox Cx lesion is 30% stenosed.   Dist Cx lesion is 30% stenosed.   Dist LAD lesion is 40% stenosed.   Findings:   Ao = 157/85 (114) LV = 150/20 RA = 9 RV = 51/9 PA = 48/19 (32) PCW = 14 Fick cardiac output/index = 5.4/2.9 PVR = 3.3 WU FA sat = 95% PA sat = 73%, 69%  Assessment: 1v CAD with chronic occlusion of mRCA Relatively well-compensated filling pressures with mild pulmonary HTN Normal  cardiac output   Difficult coronary ostia to engage due to tortuous subclavian artery. JL 3.5 use for left system LCB used for the RCA which had a high anterior takeoff    Patient Profile     71 y.o. female with a hx of mechanical MVR in 2001; CAD s/p CABG; history of breast cancer s/p L mastectomy, radiation, chemo; COPD with tobacco use; prior DVT/PE who is being followed for the evaluation of heart failure at the request of Dr. Daryll Drown.  Assessment & Plan    Acute on chronic systolic and diastolic heart failure Mechanical MVR History of PE, reduced RV function, severe TR -I personally reviewed her echo. Tricuspid valve does not coapt, leading to severe TR. Suspect PASP not accurate due to equalization of RV/RA pressures. Mechanical mitral valve appears stable, with mean gradient of 8 mmHg at 107 bpm. Severe biatrial enlargement. Abnormal septal motion c/w RV pressure/volume overload. Severe reduction in RV function.  -pharmacy managing coumadin for mechanical valve, INR goal 2.5-3.5 -home HF regimen: farxiga 10 mg daily, furosemide 40 mg (was BID for short time prior to admission), hydralazine 25 mg TID, imdur 30 mg daily, metoprolol succinate 25 mg daily, simvastatin 20 mg daily, spironolactine 25 mg daily. Wilder Glade and spironolactone currently on hold due to acute kidney injury -her EF has varied over the last few echoes. She denies chest pain. hsTn low and flat. Given her presentation and her mechanical valve (which would require bridging), I think it is reasonable to hold off on cath at this time. She has known CAD as below -would continue diuresis. Admission weight 81.2 kg, weight today 80.3 kg. Last clinic weight 76.9 kg.  Atypical atrial flutter -on reviewing her telemetry pattern, she has almost no heart rate variability. This has been noted on ECG back to 12/2021, prior to that was clearly sinus rhythm -given her MVR, my suspicion is she has been in atypical atrial flutter for the last  ~month, and this may be the reason for her volume overload -after discussion, we will start PO amiodarone now and potentially cardiovert as an outpatient once she has an amio load -will ask pharmacy for assistance with her coumadin dosing with the start of amiodarone   CAD -mRCA occluded. Reported prior history of CABG, but no grafts mentioned on cath report. I personally reviewed the films. I also reviewed her three most recent Cts and cannot clearly see a graft. -not on antiplatelet as she is on coumadin -on simvastatin. Will check cholesterol tomorrow. Would consider intensifying if LDL >70.   Tobacco use: advised cessation  Spoke with her daughter via speakerphone as well, updated with the plan and agreeable.  For questions or updates, please contact [place] Please consult [email or web address] for contact info under     Signed, Buford Dresser, MD  02/20/2022, 8:19 AM

## 2022-02-20 NOTE — Progress Notes (Signed)
Mobility Specialist - Progress Note   02/20/22 1517  Mobility  Activity Ambulated with assistance in hallway  Level of Assistance Contact guard assist, steadying assist  Assistive Device None  Distance Ambulated (ft) 100 ft  Activity Response Tolerated fair  $Mobility charge 1 Mobility    Pt was received in bed and agreeable to mobility. Pt required X2 standing break due to left foot numbness. Pt was returned back to bed with all needs met.  Larey Seat

## 2022-02-20 NOTE — Progress Notes (Signed)
Notified provider of anxiety and having trouble sleeping.  Patient also reveals she drinks beer still everday.  Orders received for sleep aide.

## 2022-02-20 NOTE — Progress Notes (Addendum)
Subjective:  71 y.o. female living with a PMHx of CHF post MV replacement, COPD, PEs, and hypertension who presented with shortness of breath and admitted for acute decompensated heart failure exacerbation.  Overnight Events: Pt complaining of anxiety and difficulty sleeping. Given Ramelteon. No other events.  Pt reports improvement of her SOB and LE swelling. She has been able to walk short distances without difficulty. Denies any chest pain. States her chronic back pain has improved on increased Norco. She is complaining of some sinus congestion and fullness in her ears.   Objective:  Vital signs in last 24 hours: Vitals:   02/20/22 0342 02/20/22 0743 02/20/22 0843 02/20/22 0911  BP: 116/73  (!) 122/109   Pulse:   (!) 110   Resp: 18  (!) 23 20  Temp: 98.1 F (36.7 C)  98.7 F (37.1 C)   TempSrc: Oral  Oral   SpO2: 93% 94% 93%   Weight: 80.3 kg     Height:       Weight change: -0.8 kg  Intake/Output Summary (Last 24 hours) at 02/20/2022 0946 Last data filed at 02/19/2022 2300 Gross per 24 hour  Intake 480 ml  Output 1000 ml  Net -520 ml   Constitutional: alert, laying in bed, in no acute distress  HENT: normocephalic atraumatic   Cardiovascular: tachycardic rate, regular rhythm. No JVD Pulmonary: normal work of breathing,  clear to auscultation bilaterally MSK: normal bulk and tone Neurological: alert & oriented x 3 Skin: warm and dry, no LE edema  Psych: normal mood and behavior   Assessment/Plan:  Principal Problem:   Combined systolic and diastolic congestive heart failure (HCC) Active Problems:   Pulmonary embolism (HCC)   Chronic low back pain   Tobacco use disorder   COPD (chronic obstructive pulmonary disease) (HCC)   S/P MVR (mitral valve replacement)   Osteoarthritis of left knee   Combined systolic and diastolic heart failure (HCC)   CHF (congestive heart failure) (HCC)  # Acute on chronic HFrEF exacerbation LVEF 35-40% # Biventricular  failure Reports continued improvement of SOB. No LE edema. No JVD. Nearing EDW.  Cardiology evaluated and felt catheterization was not necessary at this time as she is chest pain free and troponin has been low and flat. Recommended continued diuresis. > will continue IV Lasix 80 mg BID another day (hold parameters is BP below 100/70). > Monitor Ins-Outs and collect daily weight > Continue Toprol XL, hydralazine, and Imdur  > Holding home Farxiga and spironolactone given AKI. Consider restarting Wilder Glade tomorrow.  Given her low K daily, she may need to restart spironolactone if blood pressure tolerates tomorrow as well.  > will likely need some form of diuretic on discharge > Consider increasing metoprolol succinate if tachycardia does not resolve > will need to follow up in HF clinic.   Hx of PE Hx of MV replacement Goal INR 2.5-3.5 given mechanical MV. Follows Dr. Elie Confer. Pt currently on Warfarin per pharmacy. INR 3.6 today > Continue warfarin per pharmacy > recheck PT/INR in AM  AKI on CKD IIIA BUN 41. Creatinine down to 1.28 from 1.89. GFR up to 45 from 28 yesterday. Improvement attributed to continued  Lasix.  > Continue IV Lasix 80 mg BID as BP tolerates > Recheck labs in AM  COPD On Stiolto respimat, albuterol, and montelukast at home. Does not follow with pulmonologist. No respiratory distress or wheezing on exam. > Continue albuterol and montelukast > Brovana nebulizer and incruse ellipta > Possible pulmonology referral  as outpatient  Chronic low back pain Pt reports improvement of her chronic low back pain after increased dose of Norco. Self-reported history of radiculopathy and sciatic nerve pain. Prescribed Norco 5-'325mg'$  q4h PRN at home. States she has been doubling up on dose for adequate control prior to admission.  > continue Norco 10-650 mg Q4  Tobacco use disorder Smokes 6 cigarettes a day for the last 30 years >continue nicotine patch  Hennie Duos I, Medical  Student 02/20/2022, 9:46 AM   Attestation for Student Documentation: I personally was present and performed or re-performed the history, physical exam and medical decision-making activities of this service and have verified that the service and findings are accurately documented in the student's note.  Delene Ruffini, MD 02/20/2022, 12:43 PM

## 2022-02-20 NOTE — Progress Notes (Signed)
Physical Therapy Treatment Patient Details Name: Kim Anthony [name]: [id number] DOB: Apr 30, 1951 Today's Date: 02/20/2022   History of Present Illness Pt is 71 yo female who presented for SOB. PMH includes: chronic HFpEF (last EF 50-55%) with mitral valve replacement on chronic coumadin, COPD, CAD, history of pulmonary embolism.    PT Comments    Pt seen for mobility progression today with increased gait distance with no issues. Pt's SaO2 >/= 94% on RA with session. HR 108-112 BMP. Acute PT to continue during pt's hospital stay.   Recommendations for follow up therapy are one component of a multi-disciplinary discharge planning process, led by the attending physician.  Recommendations may be updated based on patient status, additional functional criteria and insurance authorization.  Follow Up Recommendations  No PT follow up     Assistance Recommended at Discharge PRN  Patient can return home with the following A little help with bathing/dressing/bathroom;Assistance with cooking/housework;Help with stairs or ramp for entrance   Equipment Recommendations  None recommended by PT    Precautions / Restrictions Precautions Precautions: Fall Restrictions Weight Bearing Restrictions: No     Mobility  Bed Mobility               General bed mobility comments: in bathroom on arrival and seated EOB at end of session    Transfers Overall transfer level: Modified independent Equipment used: None Transfers: Sit to/from Stand Sit to Stand: Modified independent (Device/Increase time)           General transfer comment: sit<>stand from toilet and to/from EOB with no issues or assistance needed    Ambulation/Gait Ambulation/Gait assistance: Supervision Gait Distance (Feet): 15 Feet (x1 bathroom to bed, then 120 feet in room (pt delined to leave room)) Assistive device: None Gait Pattern/deviations: Step-through pattern Gait velocity: decreased Gait velocity interpretation:  <1.31 ft/sec, indicative of household ambulator   General Gait Details: decreased stride, no balance issues noted        Balance Overall balance assessment: No apparent balance deficits (not formally assessed)           Cognition Arousal/Alertness: Awake/alert Behavior During Therapy: WFL for tasks assessed/performed Overall Cognitive Status: Within Functional Limits for tasks assessed               General Comments General comments (skin integrity, edema, etc.): able to stand at sink for personal care/wash hands with weight shifting/reaching and no issues noted      Pertinent Vitals/Pain Pain Assessment Pain Assessment: No/denies pain     PT Goals (current goals can now be found in the care plan section) Acute Rehab PT Goals Patient Stated Goal: to return home PT Goal Formulation: With patient Time For Goal Achievement: 03/05/22 Potential to Achieve Goals: Good Additional Goals Additional Goal #1: Pt will score >19/24 on the DGI to indicate a reduced risk for falls Progress towards PT goals: Progressing toward goals    Frequency    Min 3X/week      PT Plan Current plan remains appropriate       AM-PAC PT "6 Clicks" Mobility   Outcome Measure  Help needed turning from your back to your side while in a flat bed without using bedrails?: None Help needed moving from lying on your back to sitting on the side of a flat bed without using bedrails?: None Help needed moving to and from a bed to a chair (including a wheelchair)?: None Help needed standing up from a chair using your arms (e.g., wheelchair or  bedside chair)?: None Help needed to walk in hospital room?: A Little Help needed climbing 3-5 steps with a railing? : A Little 6 Click Score: 22    End of Session   Activity Tolerance: Patient tolerated treatment well Patient left: in bed;with call bell/phone within reach (seated EOB) Nurse Communication: Mobility status PT Visit Diagnosis: Other  abnormalities of gait and mobility (R26.89)     Time: 9417-4081 PT Time Calculation (min) (ACUTE ONLY): 13 min  Charges:  $Gait Training: 8-22 mins                    Willow Ora, PTA, [place]- [phone] 02/20/22, 9:12 AM    Willow Ora 02/20/2022, 9:11 AM

## 2022-02-20 NOTE — Progress Notes (Signed)
ANTICOAGULATION CONSULT NOTE - follow up  Pharmacy Consult for Coumadin Indication: mechanical mitral valve   Allergies  Allergen Reactions   Lisinopril Swelling   Acetaminophen-Codeine Itching   Propoxyphene Itching    Darvocet   Tape Rash    Plastic   Tramadol Itching, Nausea And Vomiting and Rash    Patient Measurements: Height: '5\' 5"'$  (165.1 cm) Weight: 80.3 kg (177 lb 1.6 oz) IBW/kg (Calculated) : 57   Vital Signs: Temp: 98 F (36.7 C) (09/01 1239) Temp Source: Oral (09/01 1239) BP: 109/86 (09/01 1239) Pulse Rate: 111 (09/01 1239)  Labs: Recent Labs    02/18/22 0217 02/18/22 0355 02/18/22 0519 02/19/22 0551 02/20/22 0531  HGB 12.3 12.6 12.0  --   --   HCT 37.9 37.0 38.2  --   --   PLT 314  --  305  --   --   LABPROT 24.6*  --   --  32.8* 35.7*  INR 2.2*  --   --  3.2* 3.6*  CREATININE 1.94*  --  1.89* 1.89* 1.28*  TROPONINIHS 18*  --  18*  --   --      Estimated Creatinine Clearance: 41.6 mL/min (A) (by C-G formula based on SCr of 1.28 mg/dL (H)).   Medical History: Past Medical History:  Diagnosis Date   Arthritis    Asthma    Breast cancer ([place]) 2001   Left Breast Cancer   CHF (congestive heart failure) (HCC)    COPD (chronic obstructive pulmonary disease) (HCC)    Coronary artery disease    GERD (gastroesophageal reflux disease)    History of mitral valve replacement with mechanical valve    HLD (hyperlipidemia)    Hypertension    Pulmonary embolism ([place]) 2021    Medications:  No current facility-administered medications on file prior to encounter.   Current Outpatient Medications on File Prior to Encounter  Medication Sig Dispense Refill   albuterol (PROVENTIL) (2.5 MG/3ML) 0.083% nebulizer solution USE 1 VIAL IN NEBULIZER EVERY 6 HOURS AS NEEDED FOR WHEEZING FOR SHORTNESS OF BREATH (Patient taking differently: Take 2.5 mg by nebulization every 6 (six) hours as needed for shortness of breath.) 90 mL 2   albuterol (VENTOLIN HFA) 108 (90  Base) MCG/ACT inhaler Inhale 2 puffs into the lungs every 6 (six) hours as needed for wheezing or shortness of breath. 8 g 2   diphenhydrAMINE (BENADRYL) 25 MG tablet Take 75 mg by mouth daily as needed for allergies.     docusate sodium (COLACE) 100 MG capsule Take 1 capsule (100 mg total) by mouth 2 (two) times daily as needed for mild constipation. (Patient taking differently: Take 100 mg by mouth daily as needed for mild constipation.) 30 capsule 1   FARXIGA 10 MG TABS tablet Take 1 tablet by mouth once daily 90 tablet 0   fluticasone (FLONASE) 50 MCG/ACT nasal spray Place 1 spray into both nostrils daily as needed for allergies. 9.9 mL 2   furosemide (LASIX) 40 MG tablet Take 1 tablet (40 mg total) by mouth 2 (two) times daily for 5 days. Then return to your normal dose of 1 tablet daily (Patient taking differently: Take 40 mg by mouth daily.) 45 tablet 3   gabapentin (NEURONTIN) 600 MG tablet TAKE 1 & 1/2 (ONE & ONE-HALF) TABLETS BY MOUTH THREE TIMES DAILY FOR  NERVE  PAIN (Patient taking differently: Take 900 mg by mouth 3 (three) times daily. Take 1 and 1/2 tablets by mouth three times a day  for nerve pain) 150 tablet 5   hydrALAZINE (APRESOLINE) 25 MG tablet Take 1 tablet (25 mg total) by mouth 3 (three) times daily. 90 tablet 11   HYDROcodone-acetaminophen (NORCO) 5-325 MG tablet Take 1 tablet by mouth every 4 (four) hours as needed for moderate pain. 150 tablet 0   isosorbide mononitrate (IMDUR) 30 MG 24 hr tablet Take 1 tablet (30 mg total) by mouth daily. 90 tablet 2   loratadine (CLARITIN) 10 MG tablet Take 1 tablet (10 mg total) by mouth daily. 90 tablet 0   metoprolol succinate (TOPROL-XL) 25 MG 24 hr tablet Take 1 tablet (25 mg total) by mouth daily. 90 tablet 2   montelukast (SINGULAIR) 10 MG tablet Take 1 tablet (10 mg total) by mouth at bedtime. 90 tablet 2   polyethylene glycol (MIRALAX / GLYCOLAX) 17 g packet Take 17 g by mouth daily. (Patient taking differently: Take 17 g by mouth  daily as needed for mild constipation.) 14 each 0   Simethicone (GAS-X PO) Take 1 tablet by mouth daily.     simvastatin (ZOCOR) 20 MG tablet Take 1 tablet (20 mg total) by mouth daily. 90 tablet 2   spironolactone (ALDACTONE) 25 MG tablet Take 1 tablet by mouth once daily (Patient taking differently: Take 25 mg by mouth daily.) 90 tablet 3   Tiotropium Bromide-Olodaterol (STIOLTO RESPIMAT) 2.5-2.5 MCG/ACT AERS INHALE 2 PUFFS BY MOUTH ONCE DAILY (Patient taking differently: Inhale 2 puffs by mouth daily) 4 g 0   warfarin (COUMADIN) 5 MG tablet TAKE 1& 1/2  TABLETS BY MOUTH ONCE DAILY AT 4 PM. (Patient taking differently: Take 5 mg by mouth at bedtime.) 42 tablet 1   famotidine (PEPCID) 20 MG tablet Take 1 tablet by mouth twice daily (Patient not taking: Reported on 02/18/2022) 60 tablet 0   nicotine (NICODERM CQ - DOSED IN MG/24 HOURS) 21 mg/24hr patch Place 1 patch (21 mg total) onto the skin daily. (Patient not taking: Reported on 02/18/2022) 30 patch 2   potassium chloride SA (KLOR-CON M) 20 MEQ tablet Take 2 tablets (40 mEq total) by mouth daily. (Patient taking differently: Take 20 mEq by mouth daily.) 30 tablet 6   Spacer/Aero-Holding Chambers (BREATHERITE COLL SPACER ADULT) MISC 1 applicator by Does not apply route daily. 1 each 0     Assessment: 71 y.o. female with h/o mechanical MVR and PE , on  Coumadin.  Goal INR is 2.5-3.5.   INR on admission was slightly subtherapeutic at 2.2 on 02/18/22.  Noted that pt has had multiple recent dose adjustments to Coumadin regimen prior to admission due to previous supra-therapeutic INR results.    Update 02/20/22:   INR 3.6 , increased slightly above goal INR 2.5-3.5.  No bleeding reported. Hgb: 12.0  on admit , pltc wnl (8/30). Will recheck CBC in AM.   Goal of Therapy:  INR 2.5-3.5 Monitor platelets by anticoagulation protocol: Yes   Plan:  Give Coumadin 5 mg today x1 Daily INR   Thank you for allowing pharmacy to be part of this patients care  team.   Nicole Cella, [place] Pharmacist [phone] 02/20/2022,2:21 PM

## 2022-02-20 NOTE — Consult Note (Addendum)
[facility] CM Inpatient Consult   02/20/2022  MALISE YANIK 09/09/1950 [id number]   [facility] [[facility]]  Accountable Care Organization [ACO] Patient: [employer] Medicare   Primary Care Provider: Willette Cluster, MD, [facility] Internal Medicine is an with a Care Management team and program and is listed for the [facility] follow up needs    Patient screened for high risk score for unplanned readmission risk.  Reviewed to assess for potential [facility]  [[facility]] Care Management service needs for post hospital transition for readmission prevention needs.  Review of patient's medical record reveals patient has previously been active with chronic care management when provider Embedded.  Met with patient at the bedside for care coordination and post hospital follow up.  Patient endorses follow up with PCP and given an appointment reminder card and a 24 hour nurse advise line magnet.   Plan:  Continue to follow progress and disposition to assess for post hospital care management needs.  Referral request can be made closer to transition.   For questions contact:    Charlesetta Shanks, RN BSN CCM [facility]  [phone] business mobile phone Toll free office [phone]  Fax number: [phone] [email or web address]

## 2022-02-21 DIAGNOSIS — I130 Hypertensive heart and chronic kidney disease with heart failure and stage 1 through stage 4 chronic kidney disease, or unspecified chronic kidney disease: Secondary | ICD-10-CM | POA: Diagnosis not present

## 2022-02-21 DIAGNOSIS — I5043 Acute on chronic combined systolic (congestive) and diastolic (congestive) heart failure: Secondary | ICD-10-CM | POA: Diagnosis not present

## 2022-02-21 DIAGNOSIS — I5082 Biventricular heart failure: Secondary | ICD-10-CM | POA: Diagnosis not present

## 2022-02-21 DIAGNOSIS — N1831 Chronic kidney disease, stage 3a: Secondary | ICD-10-CM | POA: Diagnosis not present

## 2022-02-21 DIAGNOSIS — Z20822 Contact with and (suspected) exposure to covid-19: Secondary | ICD-10-CM | POA: Diagnosis not present

## 2022-02-21 DIAGNOSIS — N179 Acute kidney failure, unspecified: Secondary | ICD-10-CM | POA: Diagnosis not present

## 2022-02-21 LAB — PROTIME-INR
INR: 3.2 — ABNORMAL HIGH (ref 0.8–1.2)
Prothrombin Time: 32.7 seconds — ABNORMAL HIGH (ref 11.4–15.2)

## 2022-02-21 LAB — BASIC METABOLIC PANEL
Anion gap: 12 (ref 5–15)
BUN: 35 mg/dL — ABNORMAL HIGH (ref 8–23)
CO2: 26 mmol/L (ref 22–32)
Calcium: 9.2 mg/dL (ref 8.9–10.3)
Chloride: 100 mmol/L (ref 98–111)
Creatinine, Ser: 1.1 mg/dL — ABNORMAL HIGH (ref 0.44–1.00)
GFR, Estimated: 53 mL/min — ABNORMAL LOW (ref >60)
Glucose, Bld: 102 mg/dL — ABNORMAL HIGH (ref 70–99)
Potassium: 2.8 mmol/L — ABNORMAL LOW (ref 3.5–5.1)
Sodium: 138 mmol/L (ref 135–145)

## 2022-02-21 LAB — MAGNESIUM: Magnesium: 1.9 mg/dL (ref 1.7–2.4)

## 2022-02-21 LAB — POTASSIUM: Potassium: 3.4 mmol/L — ABNORMAL LOW (ref 3.5–5.1)

## 2022-02-21 LAB — CBC
HCT: 39.8 % (ref 36.0–46.0)
Hemoglobin: 12.5 g/dL (ref 12.0–15.0)
MCH: 29.5 pg (ref 26.0–34.0)
MCHC: 31.4 g/dL (ref 30.0–36.0)
MCV: 93.9 fL (ref 80.0–100.0)
Platelets: 317 10*3/uL (ref 150–400)
RBC: 4.24 MIL/uL (ref 3.87–5.11)
RDW: 13.7 % (ref 11.5–15.5)
WBC: 7 10*3/uL (ref 4.0–10.5)
nRBC: 0 % (ref 0.0–0.2)

## 2022-02-21 MED ORDER — DAPAGLIFLOZIN PROPANEDIOL 10 MG PO TABS
10.0000 mg | ORAL_TABLET | Freq: Every day | ORAL | Status: DC
  Administered 2022-02-21 – 2022-02-26 (×6): 10 mg via ORAL
  Filled 2022-02-21 (×6): qty 1

## 2022-02-21 MED ORDER — POTASSIUM CHLORIDE CRYS ER 20 MEQ PO TBCR: 40.0000 meq | EXTENDED_RELEASE_TABLET | Freq: Three times a day (TID) | ORAL | Status: DC

## 2022-02-21 MED ORDER — POTASSIUM CHLORIDE CRYS ER 20 MEQ PO TBCR
40.0000 meq | EXTENDED_RELEASE_TABLET | Freq: Two times a day (BID) | ORAL | Status: DC
  Administered 2022-02-21 – 2022-02-22 (×2): 40 meq via ORAL
  Filled 2022-02-21 (×2): qty 2

## 2022-02-21 MED ORDER — POTASSIUM CHLORIDE CRYS ER 20 MEQ PO TBCR
40.0000 meq | EXTENDED_RELEASE_TABLET | Freq: Two times a day (BID) | ORAL | Status: DC
  Administered 2022-02-21: 40 meq via ORAL
  Filled 2022-02-21: qty 2

## 2022-02-21 MED ORDER — POTASSIUM CHLORIDE CRYS ER 20 MEQ PO TBCR
40.0000 meq | EXTENDED_RELEASE_TABLET | Freq: Once | ORAL | Status: AC
  Administered 2022-02-21: 40 meq via ORAL
  Filled 2022-02-21: qty 2

## 2022-02-21 MED ORDER — POTASSIUM CHLORIDE 10 MEQ/100ML IV SOLN
10.0000 meq | INTRAVENOUS | Status: DC
  Filled 2022-02-21: qty 100

## 2022-02-21 MED ORDER — WARFARIN SODIUM 3 MG PO TABS
3.0000 mg | ORAL_TABLET | Freq: Once | ORAL | Status: AC
  Administered 2022-02-21: 3 mg via ORAL
  Filled 2022-02-21: qty 1

## 2022-02-21 MED ORDER — MAGNESIUM SULFATE 2 GM/50ML IV SOLN
2.0000 g | Freq: Once | INTRAVENOUS | Status: DC
  Filled 2022-02-21: qty 50

## 2022-02-21 MED ORDER — MAGNESIUM OXIDE -MG SUPPLEMENT 400 (240 MG) MG PO TABS
800.0000 mg | ORAL_TABLET | Freq: Once | ORAL | Status: AC
Start: 2022-02-21 — End: 2022-02-21
  Administered 2022-02-21: 800 mg via ORAL
  Filled 2022-02-21: qty 2

## 2022-02-21 MED ORDER — AMIODARONE IV BOLUS ONLY 150 MG/100ML
150.0000 mg | Freq: Once | INTRAVENOUS | Status: AC
  Administered 2022-02-21: 150 mg via INTRAVENOUS
  Filled 2022-02-21 (×2): qty 100

## 2022-02-21 NOTE — Progress Notes (Signed)
Pt had 2 new PIV by IV team today. When I start Mag and Potassium IV she couldn't tolerate pain at IV sites.  Had to stop both IVF.  MD paged.

## 2022-02-21 NOTE — Progress Notes (Signed)
Notified provider of 7 beats of Vtach.  Patient is asymptomatic.

## 2022-02-21 NOTE — Progress Notes (Signed)
Subjective:  71 y.o. female living with a PMHx of CHF post MV replacement, COPD, PEs, and hypertension who presented with shortness of breath and admitted for acute decompensated heart failure exacerbation.  Overnight events: RN reports 7 beat of Vtach overnight with asymptomatic patient.  Patient in NAD. States she did have a transient episode of SOB this morning but feels it may have been anxiety-related. No symptoms currently. Appears euvolemic. Denies any chest pain or other complains. Has been urinating appropriately.   Objective:  Vital signs in last 24 hours: Vitals:   02/21/22 0317 02/21/22 0535 02/21/22 0809 02/21/22 0820  BP: 124/87 129/88  (!) 127/91  Pulse: (!) 105 (!) 106  (!) 107  Resp: '18 20  17  '$ Temp:  97.7 F (36.5 C)  97.8 F (36.6 C)  TempSrc:  Oral  Oral  SpO2: 97% 95% 95% 93%  Weight:  78.5 kg    Height:       Weight change: -1.928 kg  Intake/Output Summary (Last 24 hours) at 02/21/2022 1051 Last data filed at 02/21/2022 8099 Gross per 24 hour  Intake 360 ml  Output 600 ml  Net -240 ml   Constitutional: alert, laying in bed, in no acute distress  HENT: normocephalic atraumatic   Cardiovascular: tachycardic rate, regular rhythm. No JVD Pulmonary: normal work of breathing,  clear to auscultation bilaterally MSK: normal bulk and tone Neurological: alert & oriented x 3 Skin: warm and dry, no LE edema  Psych: normal mood and behavior   Assessment/Plan:  Principal Problem:   Combined systolic and diastolic congestive heart failure (HCC) Active Problems:   Pulmonary embolism (HCC)   Chronic low back pain   Tobacco use disorder   COPD (chronic obstructive pulmonary disease) (HCC)   S/P MVR (mitral valve replacement)   Osteoarthritis of left knee   Combined systolic and diastolic heart failure (HCC)   CHF (congestive heart failure) (HCC)   Atypical atrial flutter (HCC)  # Acute on chronic HFrEF exacerbation LVEF 35-40% # Biventricular  failure SOB still improving with last O2 sat at 93%. On 2L [place]. No LE edema or JVD. No JVD. Still nearing EDW.  Cardiology evaluated and recommends continue IV Lasix one more day with transition to oral. Also believes volume overload may be secondary to atypical atrial flutter, NSVT noted on telemetry. Given improved kidney function, can restart Farxiga Potassium still low at 2.8 > continue Lasix '80mg'$  BID (hold parameters if BP below 100/70) > PO amiodarone 400 mg BID with plans for TEE/DCCV in a few days per cardio recommendation > monitor Ins-Outs and collect daily weights > Continue hydralazine and Imdur > Toprol XL  > IV and oral potassium chloride, BMP in PM > Restart Farxiga > Follow up in HF clinic   Hx of PE Hx of MV replacement Goal INR 2.5-3.5 given mechanical MV. Follows Dr. Elie Confer. Pt currently on Warfarin per pharmacy. INR 3.2 today. Cardiology arranging for TEE as she was subtherapeutic on admission. > Continue warfarin per pharmacy > recheck PT/INR in AM  AKI on CKD IIIA BUN 35. Creatinine down to 1.1 from 1.28. GFR up to 53 from 45 yesterday. Improvement attributed to continued  Lasix.  > Continue IV Lasix 80 mg BID as BP tolerates > Recheck labs in AM  COPD On Stiolto respimat, albuterol, and montelukast at home. Does not follow with pulmonologist. No respiratory distress or wheezing on exam. > Continue albuterol and montelukast > Brovana nebulizer and incruse ellipta > Possible pulmonology  referral as outpatient  Chronic low back pain No complaints of back pain today. Self-reported history of radiculopathy and sciatic nerve pain. Prescribed Norco 5-'325mg'$  q4h PRN at home. States she has been doubling up on dose for adequate control prior to admission.  > continue Norco 10-650 mg Q4 PRN  Tobacco use disorder Smokes 6 cigarettes a day for the last 30 years >continue nicotine patch  Diet: Heart Healthy, Carb-modified VTE: Coumadin IVF: None,None Code:  Full Prior to Admission Living Arrangement: Home, living with daughter Anticipated Discharge Location: Home Barriers to Discharge: CHF exacerbation Dispo: Admit patient to inpatient      LOS: 2 days   Hennie Duos I, Medical Student 02/21/2022, 10:51 AM   Attestation for Student Documentation:  I personally was present and performed or re-performed the history, physical exam and medical decision-making activities of this service and have verified that the service and findings are accurately documented in the student's note.  Iona Beard, MD 02/21/2022, 12:18 PM

## 2022-02-21 NOTE — Progress Notes (Addendum)
ANTICOAGULATION CONSULT NOTE - Follow Up  Pharmacy Consult for Coumadin Indication: mechanical mitral valve   Allergies  Allergen Reactions   Lisinopril Swelling   Acetaminophen-Codeine Itching   Propoxyphene Itching    Darvocet   Tape Rash    Plastic   Tramadol Itching, Nausea And Vomiting and Rash    Patient Measurements: Height: '5\' 5"'$  (165.1 cm) Weight: 78.5 kg (173 lb) IBW/kg (Calculated) : 57   Vital Signs: Temp: 97.8 F (36.6 C) (09/02 0820) Temp Source: Oral (09/02 0820) BP: 127/91 (09/02 0820) Pulse Rate: 107 (09/02 0820)  Labs: Recent Labs    02/19/22 0551 02/20/22 0531 02/21/22 0322  HGB  --   --  12.5  HCT  --   --  39.8  PLT  --   --  317  LABPROT 32.8* 35.7* 32.7*  INR 3.2* 3.6* 3.2*  CREATININE 1.89* 1.28* 1.10*     Estimated Creatinine Clearance: 47.9 mL/min (A) (by C-G formula based on SCr of 1.1 mg/dL (H)).   Medical History: Past Medical History:  Diagnosis Date   Arthritis    Asthma    Breast cancer ([place]) 2001   Left Breast Cancer   CHF (congestive heart failure) (HCC)    COPD (chronic obstructive pulmonary disease) (HCC)    Coronary artery disease    GERD (gastroesophageal reflux disease)    History of mitral valve replacement with mechanical valve    HLD (hyperlipidemia)    Hypertension    Pulmonary embolism ([place]) 2021    Medications:  No current facility-administered medications on file prior to encounter.   Current Outpatient Medications on File Prior to Encounter  Medication Sig Dispense Refill   albuterol (PROVENTIL) (2.5 MG/3ML) 0.083% nebulizer solution USE 1 VIAL IN NEBULIZER EVERY 6 HOURS AS NEEDED FOR WHEEZING FOR SHORTNESS OF BREATH (Patient taking differently: Take 2.5 mg by nebulization every 6 (six) hours as needed for shortness of breath.) 90 mL 2   albuterol (VENTOLIN HFA) 108 (90 Base) MCG/ACT inhaler Inhale 2 puffs into the lungs every 6 (six) hours as needed for wheezing or shortness of breath. 8 g 2    diphenhydrAMINE (BENADRYL) 25 MG tablet Take 75 mg by mouth daily as needed for allergies.     docusate sodium (COLACE) 100 MG capsule Take 1 capsule (100 mg total) by mouth 2 (two) times daily as needed for mild constipation. (Patient taking differently: Take 100 mg by mouth daily as needed for mild constipation.) 30 capsule 1   FARXIGA 10 MG TABS tablet Take 1 tablet by mouth once daily 90 tablet 0   fluticasone (FLONASE) 50 MCG/ACT nasal spray Place 1 spray into both nostrils daily as needed for allergies. 9.9 mL 2   furosemide (LASIX) 40 MG tablet Take 1 tablet (40 mg total) by mouth 2 (two) times daily for 5 days. Then return to your normal dose of 1 tablet daily (Patient taking differently: Take 40 mg by mouth daily.) 45 tablet 3   gabapentin (NEURONTIN) 600 MG tablet TAKE 1 & 1/2 (ONE & ONE-HALF) TABLETS BY MOUTH THREE TIMES DAILY FOR  NERVE  PAIN (Patient taking differently: Take 900 mg by mouth 3 (three) times daily. Take 1 and 1/2 tablets by mouth three times a day for nerve pain) 150 tablet 5   hydrALAZINE (APRESOLINE) 25 MG tablet Take 1 tablet (25 mg total) by mouth 3 (three) times daily. 90 tablet 11   HYDROcodone-acetaminophen (NORCO) 5-325 MG tablet Take 1 tablet by mouth every 4 (four)  hours as needed for moderate pain. 150 tablet 0   isosorbide mononitrate (IMDUR) 30 MG 24 hr tablet Take 1 tablet (30 mg total) by mouth daily. 90 tablet 2   loratadine (CLARITIN) 10 MG tablet Take 1 tablet (10 mg total) by mouth daily. 90 tablet 0   metoprolol succinate (TOPROL-XL) 25 MG 24 hr tablet Take 1 tablet (25 mg total) by mouth daily. 90 tablet 2   montelukast (SINGULAIR) 10 MG tablet Take 1 tablet (10 mg total) by mouth at bedtime. 90 tablet 2   polyethylene glycol (MIRALAX / GLYCOLAX) 17 g packet Take 17 g by mouth daily. (Patient taking differently: Take 17 g by mouth daily as needed for mild constipation.) 14 each 0   Simethicone (GAS-X PO) Take 1 tablet by mouth daily.     simvastatin  (ZOCOR) 20 MG tablet Take 1 tablet (20 mg total) by mouth daily. 90 tablet 2   spironolactone (ALDACTONE) 25 MG tablet Take 1 tablet by mouth once daily (Patient taking differently: Take 25 mg by mouth daily.) 90 tablet 3   Tiotropium Bromide-Olodaterol (STIOLTO RESPIMAT) 2.5-2.5 MCG/ACT AERS INHALE 2 PUFFS BY MOUTH ONCE DAILY (Patient taking differently: Inhale 2 puffs by mouth daily) 4 g 0   warfarin (COUMADIN) 5 MG tablet TAKE 1& 1/2  TABLETS BY MOUTH ONCE DAILY AT 4 PM. (Patient taking differently: Take 5 mg by mouth at bedtime.) 42 tablet 1   famotidine (PEPCID) 20 MG tablet Take 1 tablet by mouth twice daily (Patient not taking: Reported on 02/18/2022) 60 tablet 0   nicotine (NICODERM CQ - DOSED IN MG/24 HOURS) 21 mg/24hr patch Place 1 patch (21 mg total) onto the skin daily. (Patient not taking: Reported on 02/18/2022) 30 patch 2   potassium chloride SA (KLOR-CON M) 20 MEQ tablet Take 2 tablets (40 mEq total) by mouth daily. (Patient taking differently: Take 20 mEq by mouth daily.) 30 tablet 6   Spacer/Aero-Holding Chambers (BREATHERITE COLL SPACER ADULT) MISC 1 applicator by Does not apply route daily. 1 each 0     Assessment: 71 y.o. female with h/o mechanical MVR and PE , on  Coumadin.   Goal INR is 2.5-3.5. INR on admission was slightly subtherapeutic at 2.2 on 02/18/22.  Noted that pt has had multiple recent dose adjustments to Coumadin regimen prior to admission due to previous supra-therapeutic INR results- most recent PTA regimen at 5 mg daily.  INR today 3.2. Hgb 12.5, plt 317. No s/sx of bleeding noted. Will monitor drug interaction closely since amiodarone added.  Goal of Therapy:  INR 2.5-3.5 Monitor platelets by anticoagulation protocol: Yes   Plan:  Will order warfarin 3 mg tonight  Daily INR  Thank you for allowing pharmacy to be part of this patients care team.  Antonietta Jewel, PharmD, [place] Clinical Pharmacist  Phone: [phone] 02/21/2022 3:07 PM  Please check AMION  for all [place] phone numbers After 10:00 PM, call [place] [phone]

## 2022-02-21 NOTE — Progress Notes (Addendum)
Rounding Note    Patient Name: Kim Anthony Date of Encounter: 02/21/2022  [facility] Cardiologist: Dr. Haroldine Laws  Subjective   Feels well, no concerns. Appears euvolemic. Asymptomatic.   Inpatient Medications    Scheduled Meds:  amiodarone  400 mg Oral BID   arformoterol  15 mcg Nebulization BID   And   umeclidinium bromide  1 puff Inhalation Daily   fluticasone  2 spray Each Nare BID   furosemide  80 mg Intravenous BID   hydrALAZINE  25 mg Oral TID   isosorbide mononitrate  30 mg Oral Daily   metoprolol succinate  25 mg Oral Daily   montelukast  10 mg Oral QHS   nicotine  14 mg Transdermal Daily   potassium chloride  40 mEq Oral BID   ramelteon  8 mg Oral QHS   simvastatin  20 mg Oral Daily   Warfarin - Pharmacist Dosing Inpatient   Does not apply q1600   Continuous Infusions:  PRN Meds: albuterol, dextromethorphan-guaiFENesin, guaiFENesin-dextromethorphan, HYDROcodone-acetaminophen, polyethylene glycol, sodium chloride   Vital Signs    Vitals:   02/21/22 0317 02/21/22 0535 02/21/22 0809 02/21/22 0820  BP: 124/87 129/88  (!) 127/91  Pulse: (!) 105 (!) 106  (!) 107  Resp: '18 20  17  '$ Temp:  97.7 F (36.5 C)  97.8 F (36.6 C)  TempSrc:  Oral  Oral  SpO2: 97% 95% 95% 93%  Weight:  78.5 kg    Height:        Intake/Output Summary (Last 24 hours) at 02/21/2022 0917 Last data filed at 02/21/2022 2355 Gross per 24 hour  Intake 360 ml  Output 600 ml  Net -240 ml      02/21/2022    5:35 AM 02/20/2022    3:42 AM 02/19/2022    9:23 AM  Last 3 Weights  Weight (lbs) 173 lb 177 lb 1.6 oz 177 lb 4 oz  Weight (kg) 78.472 kg 80.332 kg 80.4 kg      Telemetry    Atypical atrial flutter, NSVT- Personally Reviewed  ECG    Now new since 02/18/22 - Personally Reviewed  Physical Exam   GEN: No acute distress. Now on room air Neck: No JVD appreciated at 45 degrees Cardiac: RRR with crisp mechanical S2, no rubs, or gallops. 2/6 SEM Respiratory: improving  rales at bilateral bases GI: Soft, nontender, non-distended  MS: No edema; No deformity. Neuro:  Nonfocal  Psych: Normal affect   Labs    High Sensitivity Troponin:   Recent Labs  Lab 02/04/22 0934 02/18/22 0217 02/18/22 0519  TROPONINIHS 15 18* 18*     Chemistry Recent Labs  Lab 02/18/22 0217 02/18/22 0355 02/19/22 0551 02/20/22 0531 02/21/22 0322  NA 136   < > 135 134* 138  K 3.9   < > 3.2* 3.1* 2.8*  CL 96*   < > 101 99 100  CO2 21*   < > '24 23 26  '$ GLUCOSE 89   < > 111* 99 102*  BUN 29*   < > 40* 41* 35*  CREATININE 1.94*   < > 1.89* 1.28* 1.10*  CALCIUM 9.4   < > 8.4* 8.6* 9.2  MG  --    < > 2.3 2.2 1.9  PROT 7.2  --   --   --   --   ALBUMIN 3.2*  --   --   --   --   AST 35  --   --   --   --  ALT 17  --   --   --   --   ALKPHOS 139*  --   --   --   --   BILITOT 1.7*  --   --   --   --   GFRNONAA 27*   < > 28* 45* 53*  ANIONGAP 19*   < > '10 12 12   '$ < > = values in this interval not displayed.    Lipids  Recent Labs  Lab 02/20/22 0531  CHOL 93  TRIG 110  HDL 27*  LDLCALC 44  CHOLHDL 3.4    Hematology Recent Labs  Lab 02/18/22 0217 02/18/22 0355 02/18/22 0519 02/21/22 0322  WBC 9.0  --  7.1 7.0  RBC 3.97  --  3.98 4.24  HGB 12.3 12.6 12.0 12.5  HCT 37.9 37.0 38.2 39.8  MCV 95.5  --  96.0 93.9  MCH 31.0  --  30.2 29.5  MCHC 32.5  --  31.4 31.4  RDW 13.8  --  13.7 13.7  PLT 314  --  305 317   Thyroid No results for input(s): "TSH", "FREET4" in the last 168 hours.  BNP Recent Labs  Lab 02/18/22 0217  BNP 367.0*    DDimer No results for input(s): "DDIMER" in the last 168 hours.   Radiology    No results found.  Cardiac Studies   Echo 02/18/22 1. Left ventricular ejection fraction, by estimation, is 35 to 40%. The  left ventricle has moderately decreased function. The left ventricle  demonstrates global hypokinesis. Left ventricular diastolic function could  not be evaluated.   2. Right ventricular systolic function is severely  reduced. The right  ventricular size is severely enlarged. There is normal pulmonary artery  systolic pressure. The estimated right ventricular systolic pressure is  78.2 mmHg.   3. Left atrial size was severely dilated.   4. Right atrial size was severely dilated.   5. Prosthetic disc motion appears normal. Gradients are probably  increased due to tachycardia. The mitral valve has been repaired/replaced.  No evidence of mitral valve regurgitation. No evidence of mitral stenosis.  The mean mitral valve gradient is 8.0  mmHg with average heart rate of 107 bpm. There is a mechanical valve  present in the mitral position.   6. The tricuspid valve is abnormal. Tricuspid valve regurgitation is  severe.   7. The aortic valve is tricuspid. Aortic valve regurgitation is not  visualized. No aortic stenosis is present.   8. The inferior vena cava is dilated in size with <50% respiratory  variability, suggesting right atrial pressure of 15 mmHg.   R/LHC 01/09/21  Mid RCA lesion is 100% stenosed.   Ost Cx to Prox Cx lesion is 30% stenosed.   Dist Cx lesion is 30% stenosed.   Dist LAD lesion is 40% stenosed.   Findings:   Ao = 157/85 (114) LV = 150/20 RA = 9 RV = 51/9 PA = 48/19 (32) PCW = 14 Fick cardiac output/index = 5.4/2.9 PVR = 3.3 WU FA sat = 95% PA sat = 73%, 69%  Assessment: 1v CAD with chronic occlusion of mRCA Relatively well-compensated filling pressures with mild pulmonary HTN Normal cardiac output   Difficult coronary ostia to engage due to tortuous subclavian artery. JL 3.5 use for left system LCB used for the RCA which had a high anterior takeoff    Patient Profile     72 y.o. female with a hx of mechanical MVR in 2001; CAD  s/p CABG; history of breast cancer s/p L mastectomy, radiation, chemo; COPD with tobacco use; prior DVT/PE who is being followed for the evaluation of heart failure at the request of Dr. Daryll Drown.  Assessment & Plan    Principal Problem:    Combined systolic and diastolic congestive heart failure (HCC) Active Problems:   Pulmonary embolism (HCC)   Chronic low back pain   Tobacco use disorder   COPD (chronic obstructive pulmonary disease) (HCC)   S/P MVR (mitral valve replacement)   Osteoarthritis of left knee   Combined systolic and diastolic heart failure (HCC)   CHF (congestive heart failure) (HCC)   Atypical atrial flutter (HCC)    Acute on chronic systolic and diastolic heart failure Mechanical MVR History of PE, reduced RV function, severe TR -pharmacy managing coumadin for mechanical valve, INR goal 2.5-3.5 -home HF regimen: farxiga 10 mg daily, furosemide 40 mg (was BID for short time prior to admission), hydralazine 25 mg TID, imdur 30 mg daily, metoprolol succinate 25 mg daily, simvastatin 20 mg daily, spironolactine 25 mg daily. Wilder Glade and spironolactone currently on hold due to acute kidney injury, renal function better today. - she may be a good candidate for torsemide homegoing given better bioavailability in the setting of right heart failure/overload. -Query tachy mediated reduction in EF. May feel better with inpatient TEE/DCCV prior to dismissal.  -can continue diuresis IV one additional day, then likely transition to oral as she appears euvolemic. Admission weight 81.2 kg, weight today 78.5 kg. Last clinic weight 76.9 kg.  Atypical atrial flutter -given her MVR, my suspicion is she has been in atypical atrial flutter for the last ~month, and this may be the reason for her volume overload -po amio initiated 400 mg BID. Will aim for 5 gram load and then TEE/DCCV. Will bolus amio to faciliate load. Consider TEE/DCCV inpatient on sept 6/7. If patient requires hospital dismissal that is reasonable however with potential for variability in INR and volume status, may be more reasonable to attempt while inpatient. TEE indicated due to one low INR on presentation.  -will ask pharmacy for assistance with her coumadin  dosing with the start of amiodarone   CAD -mRCA occluded. Reported prior history of CABG, but no grafts mentioned on cath report. CT (personally reviewed) demonstrates possible clips at distal LAD and no definite LIMA visualized, query if it is occluded? Unclear if surgical material at ascending aorta is graft site vs aorta access.  - if ef remains low after restoration of SR, could consider ischemic evalation as outpt.  -not on antiplatelet as she is on coumadin -on simvastatin. Will check cholesterol . Would consider intensifying if LDL >70.   Tobacco use: advised cessation  For questions or updates, please contact [place] Please consult [email or web address] for contact info under     Signed, Elouise Munroe, MD  02/21/2022, 9:17 AM

## 2022-02-22 DIAGNOSIS — I5043 Acute on chronic combined systolic (congestive) and diastolic (congestive) heart failure: Secondary | ICD-10-CM | POA: Diagnosis not present

## 2022-02-22 LAB — BASIC METABOLIC PANEL
Anion gap: 12 (ref 5–15)
BUN: 28 mg/dL — ABNORMAL HIGH (ref 8–23)
CO2: 22 mmol/L (ref 22–32)
Calcium: 9.1 mg/dL (ref 8.9–10.3)
Chloride: 103 mmol/L (ref 98–111)
Creatinine, Ser: 1.07 mg/dL — ABNORMAL HIGH (ref 0.44–1.00)
GFR, Estimated: 55 mL/min — ABNORMAL LOW (ref >60)
Glucose, Bld: 111 mg/dL — ABNORMAL HIGH (ref 70–99)
Potassium: 3.5 mmol/L (ref 3.5–5.1)
Sodium: 137 mmol/L (ref 135–145)

## 2022-02-22 LAB — MAGNESIUM: Magnesium: 2 mg/dL (ref 1.7–2.4)

## 2022-02-22 LAB — PROTIME-INR
INR: 3.7 — ABNORMAL HIGH (ref 0.8–1.2)
Prothrombin Time: 36 seconds — ABNORMAL HIGH (ref 11.4–15.2)

## 2022-02-22 MED ORDER — POTASSIUM CHLORIDE CRYS ER 20 MEQ PO TBCR
40.0000 meq | EXTENDED_RELEASE_TABLET | Freq: Three times a day (TID) | ORAL | Status: AC
  Administered 2022-02-22 – 2022-02-23 (×3): 40 meq via ORAL
  Filled 2022-02-22 (×3): qty 2

## 2022-02-22 MED ORDER — WARFARIN SODIUM 2.5 MG PO TABS
2.5000 mg | ORAL_TABLET | Freq: Once | ORAL | Status: AC
  Administered 2022-02-22: 2.5 mg via ORAL
  Filled 2022-02-22: qty 1

## 2022-02-22 MED ORDER — SIMETHICONE 80 MG PO CHEW
80.0000 mg | CHEWABLE_TABLET | Freq: Four times a day (QID) | ORAL | Status: DC | PRN
  Administered 2022-02-22: 80 mg via ORAL
  Filled 2022-02-22: qty 1

## 2022-02-22 MED ORDER — TORSEMIDE 20 MG PO TABS
40.0000 mg | ORAL_TABLET | Freq: Every day | ORAL | Status: DC
  Administered 2022-02-22 – 2022-02-23 (×2): 40 mg via ORAL
  Filled 2022-02-22 (×2): qty 2

## 2022-02-22 NOTE — Progress Notes (Signed)
HD#3 SUBJECTIVE:  Patient Summary: 71 y.o. female living with a PMHx of CHF post MV replacement, COPD, PEs, and hypertension who presented with shortness of breath and admitted for acute decompensated heart failure exacerbation.  Overnight Events:  NAEO    Interm History:  Patient seen and evaluated at bedside. Feeling well. No complaints   OBJECTIVE:  Vital Signs: Blood pressure 111/81, pulse (!) 108, temperature 97.9 F (36.6 C), temperature source Oral, resp. rate 20, height '5\' 5"'$  (1.651 m), weight 77.6 kg, SpO2 93 %.  Vitals:   02/22/22 0626 02/22/22 0748 02/22/22 0936 02/22/22 1358  BP: 100/79 111/81    Pulse:  (!) 108    Resp: '20 20 20 20  '$ Temp:  97.9 F (36.6 C)    TempSrc:  Oral    SpO2:  93%    Weight:      Height:       Supplemental O2:  SpO2: 93 % O2 Flow Rate (L/min): 2 L/min FiO2 (%): 21 %  Filed Weights   02/20/22 0342 02/21/22 0535 02/22/22 0535  Weight: 80.3 kg 78.5 kg 77.6 kg     Intake/Output Summary (Last 24 hours) at 02/22/2022 1640 Last data filed at 02/22/2022 1400 Gross per 24 hour  Intake 793.67 ml  Output 950 ml  Net -156.33 ml   Net IO Since Admission: -1,820 mL [02/22/22 1640]  Physical Exam:  Constitutional: alert, laying in bed, in no acute distress  HENT: normocephalic atraumatic   Cardiovascular: tachycardic rate, regular rhythm. No JVD Pulmonary: normal work of breathing,  clear to auscultation bilaterally MSK: normal bulk and tone Neurological: alert & oriented x 3 Skin: warm and dry, no LE edema  Psych: normal mood and behavior   Pertinent Labs:    Latest Ref Rng & Units 02/21/2022    3:22 AM 02/18/2022    5:19 AM 02/18/2022    3:55 AM  CBC  WBC 4.0 - 10.5 K/uL 7.0  7.1    Hemoglobin 12.0 - 15.0 g/dL 12.5  12.0  12.6   Hematocrit 36.0 - 46.0 % 39.8  38.2  37.0   Platelets 150 - 400 K/uL 317  305         Latest Ref Rng & Units 02/22/2022    2:34 AM 02/21/2022    5:57 PM 02/21/2022    3:22 AM  CMP  Glucose 70 - 99  mg/dL 111   102   BUN 8 - 23 mg/dL 28   35   Creatinine 0.44 - 1.00 mg/dL 1.07   1.10   Sodium 135 - 145 mmol/L 137   138   Potassium 3.5 - 5.1 mmol/L 3.5  3.4  2.8   Chloride 98 - 111 mmol/L 103   100   CO2 22 - 32 mmol/L 22   26   Calcium 8.9 - 10.3 mg/dL 9.1   9.2     ASSESSMENT/PLAN:  Assessment: Principal Problem:   Combined systolic and diastolic congestive heart failure (HCC) Active Problems:   Pulmonary embolism (HCC)   Chronic low back pain   Tobacco use disorder   COPD (chronic obstructive pulmonary disease) (HCC)   S/P MVR (mitral valve replacement)   Osteoarthritis of left knee   Combined systolic and diastolic heart failure (HCC)   CHF (congestive heart failure) (HCC)   Atypical atrial flutter ([place])   71 y.o. female living with a PMHx of CHF post MV replacement, COPD, PEs, and hypertension who presented with shortness of breath and  admitted for acute decompensated heart failure exacerbation.  # Acute on chronic HFrEF exacerbation LVEF 35-40% # Biventricular failure Cardiology believes volume overload may be secondary to atypical atrial flutter. Wilder Glade restarted yesterday. 100% on RA now. And appears to be at dry weight. > start oral diuretic today Torsemide '40mg'$ , but this can be decreased depending on her response > PO amiodarone 400 mg BID with plans for TEE/DCCV 9/6 per cardio recommendation > monitor Ins-Outs and collect daily weights > Continue hydralazine and Imdur > Toprol XL  > Follow up in HF clinic   Atypical atrial flutter Cardiology suspecting that patient has been in atypical atrial flutter given minimal heart rate variability on telemetry likely related to MVR. Pt receiving PO amiodarone. - continue amiodarone - continue telemetry - cardiology recommending inpatient TEE/DCCV prior to discharge with likely date 9/6   Hx of PE Hx of MV replacement Goal INR 2.5-3.5 given mechanical MV. Follows Dr. Elie Confer. Pt currently on Warfarin per pharmacy.  Cardiology arranging for TEE as she was subtherapeutic on admission. > Continue warfarin per pharmacy > recheck PT/INR in AM   AKI on CKD IIIA Nearing baseline with continued diuresis. > Start oral torsemide > strict I+Os > daily BMP   COPD On Stiolto respimat, albuterol, and montelukast at home. Does not follow with pulmonologist. No respiratory distress or wheezing on exam. > Continue albuterol and montelukast > Brovana nebulizer and incruse ellipta > Possible pulmonology referral as outpatient   Chronic low back pain No complaints of back pain today. Self-reported history of radiculopathy and sciatic nerve pain. Prescribed Norco 5-'325mg'$  q4h PRN at home. States she has been doubling up on dose for adequate control prior to admission.  > continue Norco 10-650 mg Q4 PRN   Tobacco use disorder Smokes 6 cigarettes a day for the last 30 years >continue nicotine patch   Diet: Heart Healthy, Carb-modified VTE: Coumadin IVF: None,None Code: Full Prior to Admission Living Arrangement: Home, living with daughter Anticipated Discharge Location: Home Barriers to Discharge: CHF exacerbation Dispo: Admit patient to inpatient   Signature: Delene Ruffini, MD

## 2022-02-22 NOTE — Plan of Care (Signed)

## 2022-02-22 NOTE — Progress Notes (Signed)
   02/22/22 0617  Assess: MEWS Score  ECG Heart Rate (!) 109  Resp (!) 21  Assess: MEWS Score  MEWS Temp 0  MEWS Systolic 0  MEWS Pulse 1  MEWS RR 1  MEWS LOC 0  MEWS Score 2  MEWS Score Color Yellow  Assess: if the MEWS score is Yellow or Red  Were vital signs taken at a resting state? Yes  Focused Assessment No change from prior assessment  Does the patient meet 2 or more of the SIRS criteria? No  Does the patient have a confirmed or suspected source of infection? No  Provider and Rapid Response Notified? No  MEWS guidelines implemented *See Row Information* Yes  Treat  MEWS Interventions Administered scheduled meds/treatments  Pain Scale 0-10  Pain Score 7  Pain Intervention(s) Medication (See eMAR)  Take Vital Signs  Increase Vital Sign Frequency  Yellow: Q 2hr X 2 then Q 4hr X 2, if remains yellow, continue Q 4hrs  Escalate  MEWS: Escalate Yellow: discuss with charge nurse/RN and consider discussing with provider and RRT  Notify: Charge Nurse/RN  Name of Charge Nurse/RN Notified Santiago Glad  Date Charge Nurse/RN Notified 02/22/22  Time Charge Nurse/RN Notified 9150  Document  Patient Outcome Stabilized after interventions  Progress note created (see row info) Yes  Assess: SIRS CRITERIA  SIRS Temperature  0  SIRS Pulse 1  SIRS Respirations  1  SIRS WBC 1  SIRS Score Sum  3

## 2022-02-22 NOTE — Progress Notes (Signed)
ANTICOAGULATION CONSULT NOTE - Follow Up  Pharmacy Consult for Coumadin Indication: mechanical mitral valve   Allergies  Allergen Reactions   Lisinopril Swelling   Acetaminophen-Codeine Itching   Propoxyphene Itching    Darvocet   Tape Rash    Plastic   Tramadol Itching, Nausea And Vomiting and Rash    Patient Measurements: Height: '5\' 5"'$  (165.1 cm) Weight: 77.6 kg (171 lb) IBW/kg (Calculated) : 57   Vital Signs: Temp: 97.9 F (36.6 C) (09/03 0748) Temp Source: Oral (09/03 0748) BP: 111/81 (09/03 0748) Pulse Rate: 108 (09/03 0748)  Labs: Recent Labs    02/20/22 0531 02/21/22 0322 02/22/22 0234  HGB  --  12.5  --   HCT  --  39.8  --   PLT  --  317  --   LABPROT 35.7* 32.7* 36.0*  INR 3.6* 3.2* 3.7*  CREATININE 1.28* 1.10* 1.07*     Estimated Creatinine Clearance: 48.9 mL/min (A) (by C-G formula based on SCr of 1.07 mg/dL (H)).   Medical History: Past Medical History:  Diagnosis Date   Arthritis    Asthma    Breast cancer ([place]) 2001   Left Breast Cancer   CHF (congestive heart failure) (HCC)    COPD (chronic obstructive pulmonary disease) (HCC)    Coronary artery disease    GERD (gastroesophageal reflux disease)    History of mitral valve replacement with mechanical valve    HLD (hyperlipidemia)    Hypertension    Pulmonary embolism ([place]) 2021    Medications:  No current facility-administered medications on file prior to encounter.   Current Outpatient Medications on File Prior to Encounter  Medication Sig Dispense Refill   albuterol (PROVENTIL) (2.5 MG/3ML) 0.083% nebulizer solution USE 1 VIAL IN NEBULIZER EVERY 6 HOURS AS NEEDED FOR WHEEZING FOR SHORTNESS OF BREATH (Patient taking differently: Take 2.5 mg by nebulization every 6 (six) hours as needed for shortness of breath.) 90 mL 2   albuterol (VENTOLIN HFA) 108 (90 Base) MCG/ACT inhaler Inhale 2 puffs into the lungs every 6 (six) hours as needed for wheezing or shortness of breath. 8 g 2    diphenhydrAMINE (BENADRYL) 25 MG tablet Take 75 mg by mouth daily as needed for allergies.     docusate sodium (COLACE) 100 MG capsule Take 1 capsule (100 mg total) by mouth 2 (two) times daily as needed for mild constipation. (Patient taking differently: Take 100 mg by mouth daily as needed for mild constipation.) 30 capsule 1   FARXIGA 10 MG TABS tablet Take 1 tablet by mouth once daily 90 tablet 0   fluticasone (FLONASE) 50 MCG/ACT nasal spray Place 1 spray into both nostrils daily as needed for allergies. 9.9 mL 2   furosemide (LASIX) 40 MG tablet Take 1 tablet (40 mg total) by mouth 2 (two) times daily for 5 days. Then return to your normal dose of 1 tablet daily (Patient taking differently: Take 40 mg by mouth daily.) 45 tablet 3   gabapentin (NEURONTIN) 600 MG tablet TAKE 1 & 1/2 (ONE & ONE-HALF) TABLETS BY MOUTH THREE TIMES DAILY FOR  NERVE  PAIN (Patient taking differently: Take 900 mg by mouth 3 (three) times daily. Take 1 and 1/2 tablets by mouth three times a day for nerve pain) 150 tablet 5   hydrALAZINE (APRESOLINE) 25 MG tablet Take 1 tablet (25 mg total) by mouth 3 (three) times daily. 90 tablet 11   HYDROcodone-acetaminophen (NORCO) 5-325 MG tablet Take 1 tablet by mouth every 4 (four)  hours as needed for moderate pain. 150 tablet 0   isosorbide mononitrate (IMDUR) 30 MG 24 hr tablet Take 1 tablet (30 mg total) by mouth daily. 90 tablet 2   loratadine (CLARITIN) 10 MG tablet Take 1 tablet (10 mg total) by mouth daily. 90 tablet 0   metoprolol succinate (TOPROL-XL) 25 MG 24 hr tablet Take 1 tablet (25 mg total) by mouth daily. 90 tablet 2   montelukast (SINGULAIR) 10 MG tablet Take 1 tablet (10 mg total) by mouth at bedtime. 90 tablet 2   polyethylene glycol (MIRALAX / GLYCOLAX) 17 g packet Take 17 g by mouth daily. (Patient taking differently: Take 17 g by mouth daily as needed for mild constipation.) 14 each 0   Simethicone (GAS-X PO) Take 1 tablet by mouth daily.     simvastatin  (ZOCOR) 20 MG tablet Take 1 tablet (20 mg total) by mouth daily. 90 tablet 2   spironolactone (ALDACTONE) 25 MG tablet Take 1 tablet by mouth once daily (Patient taking differently: Take 25 mg by mouth daily.) 90 tablet 3   Tiotropium Bromide-Olodaterol (STIOLTO RESPIMAT) 2.5-2.5 MCG/ACT AERS INHALE 2 PUFFS BY MOUTH ONCE DAILY (Patient taking differently: Inhale 2 puffs by mouth daily) 4 g 0   warfarin (COUMADIN) 5 MG tablet TAKE 1& 1/2  TABLETS BY MOUTH ONCE DAILY AT 4 PM. (Patient taking differently: Take 5 mg by mouth at bedtime.) 42 tablet 1   famotidine (PEPCID) 20 MG tablet Take 1 tablet by mouth twice daily (Patient not taking: Reported on 02/18/2022) 60 tablet 0   nicotine (NICODERM CQ - DOSED IN MG/24 HOURS) 21 mg/24hr patch Place 1 patch (21 mg total) onto the skin daily. (Patient not taking: Reported on 02/18/2022) 30 patch 2   potassium chloride SA (KLOR-CON M) 20 MEQ tablet Take 2 tablets (40 mEq total) by mouth daily. (Patient taking differently: Take 20 mEq by mouth daily.) 30 tablet 6   Spacer/Aero-Holding Chambers (BREATHERITE COLL SPACER ADULT) MISC 1 applicator by Does not apply route daily. 1 each 0     Assessment: 71 y.o. female with h/o mechanical MVR and PE , on  Coumadin.   Goal INR is 2.5-3.5. INR on admission was slightly subtherapeutic at 2.2 on 02/18/22.  Noted that pt has had multiple recent dose adjustments to Coumadin regimen prior to admission due to previous supra-therapeutic INR results- most recent PTA regimen at 5 mg daily.  INR today increased up to 3.7. Hgb 12.5, plt 317 - on last check 9/2. Will monitor drug interaction closely since amiodarone added. Oral intake documented at 100% on 9/2.  Goal of Therapy:  INR 2.5-3.5 Monitor platelets by anticoagulation protocol: Yes   Plan:  Will order warfarin 2.5 mg tonight  Daily INR  Thank you for allowing pharmacy to be part of this patients care team.  Antonietta Jewel, PharmD, [place] Pharmacist  Phone:  [phone] 02/22/2022 2:45 PM  Please check AMION for all [place] phone numbers After 10:00 PM, call [place] [phone]

## 2022-02-22 NOTE — Progress Notes (Signed)
Rounding Note    Patient Name: Kim Anthony Date of Encounter: 02/22/2022  [facility] Cardiologist: Dr. Haroldine Laws  Subjective   Feels well, no concerns. Appears euvolemic. Asymptomatic.   Inpatient Medications    Scheduled Meds:  amiodarone  400 mg Oral BID   arformoterol  15 mcg Nebulization BID   And   umeclidinium bromide  1 puff Inhalation Daily   dapagliflozin propanediol  10 mg Oral Daily   fluticasone  2 spray Each Nare BID   hydrALAZINE  25 mg Oral TID   isosorbide mononitrate  30 mg Oral Daily   metoprolol succinate  25 mg Oral Daily   montelukast  10 mg Oral QHS   nicotine  14 mg Transdermal Daily   potassium chloride  40 mEq Oral BID   ramelteon  8 mg Oral QHS   simvastatin  20 mg Oral Daily   torsemide  40 mg Oral Daily   Warfarin - Pharmacist Dosing Inpatient   Does not apply q1600   Continuous Infusions:  PRN Meds: albuterol, dextromethorphan-guaiFENesin, guaiFENesin-dextromethorphan, HYDROcodone-acetaminophen, polyethylene glycol, sodium chloride   Vital Signs    Vitals:   02/22/22 0537 02/22/22 0617 02/22/22 0626 02/22/22 0748  BP: 120/89  100/79 111/81  Pulse:    (!) 108  Resp: (!) 21 (!) '21 20 20  '$ Temp: 97.8 F (36.6 C)   97.9 F (36.6 C)  TempSrc: Oral   Oral  SpO2: 100%   93%  Weight:      Height:        Intake/Output Summary (Last 24 hours) at 02/22/2022 0839 Last data filed at 02/21/2022 2215 Gross per 24 hour  Intake 340 ml  Output --  Net 340 ml      02/22/2022    5:35 AM 02/21/2022    5:35 AM 02/20/2022    3:42 AM  Last 3 Weights  Weight (lbs) 171 lb 173 lb 177 lb 1.6 oz  Weight (kg) 77.565 kg 78.472 kg 80.332 kg      Telemetry    Atypical atrial flutter, NSVT- Personally Reviewed  ECG    Now new since 02/18/22 - Personally Reviewed  Physical Exam   GEN: No acute distress Neck: No JVD  Cardiac: regular rhythm, tachycardic, with crisp mechanical S2, no rubs, or gallops. 2/6 SEM Respiratory: clear GI: Soft,  nontender, non-distended  MS: No edema; No deformity. Neuro:  Nonfocal  Psych: Normal affect   Labs    High Sensitivity Troponin:   Recent Labs  Lab 02/04/22 0934 02/18/22 0217 02/18/22 0519  TROPONINIHS 15 18* 18*     Chemistry Recent Labs  Lab 02/18/22 0217 02/18/22 0355 02/20/22 0531 02/21/22 0322 02/21/22 1757 02/22/22 0234  NA 136   < > 134* 138  --  137  K 3.9   < > 3.1* 2.8* 3.4* 3.5  CL 96*   < > 99 100  --  103  CO2 21*   < > 23 26  --  22  GLUCOSE 89   < > 99 102*  --  111*  BUN 29*   < > 41* 35*  --  28*  CREATININE 1.94*   < > 1.28* 1.10*  --  1.07*  CALCIUM 9.4   < > 8.6* 9.2  --  9.1  MG  --    < > 2.2 1.9  --  2.0  PROT 7.2  --   --   --   --   --  ALBUMIN 3.2*  --   --   --   --   --   AST 35  --   --   --   --   --   ALT 17  --   --   --   --   --   ALKPHOS 139*  --   --   --   --   --   BILITOT 1.7*  --   --   --   --   --   GFRNONAA 27*   < > 45* 53*  --  55*  ANIONGAP 19*   < > 12 12  --  12   < > = values in this interval not displayed.    Lipids  Recent Labs  Lab 02/20/22 0531  CHOL 93  TRIG 110  HDL 27*  LDLCALC 44  CHOLHDL 3.4    Hematology Recent Labs  Lab 02/18/22 0217 02/18/22 0355 02/18/22 0519 02/21/22 0322  WBC 9.0  --  7.1 7.0  RBC 3.97  --  3.98 4.24  HGB 12.3 12.6 12.0 12.5  HCT 37.9 37.0 38.2 39.8  MCV 95.5  --  96.0 93.9  MCH 31.0  --  30.2 29.5  MCHC 32.5  --  31.4 31.4  RDW 13.8  --  13.7 13.7  PLT 314  --  305 317   Thyroid No results for input(s): "TSH", "FREET4" in the last 168 hours.  BNP Recent Labs  Lab 02/18/22 0217  BNP 367.0*    DDimer No results for input(s): "DDIMER" in the last 168 hours.   Radiology    No results found.  Cardiac Studies   Echo 02/18/22 1. Left ventricular ejection fraction, by estimation, is 35 to 40%. The  left ventricle has moderately decreased function. The left ventricle  demonstrates global hypokinesis. Left ventricular diastolic function could  not be  evaluated.   2. Right ventricular systolic function is severely reduced. The right  ventricular size is severely enlarged. There is normal pulmonary artery  systolic pressure. The estimated right ventricular systolic pressure is  89.3 mmHg.   3. Left atrial size was severely dilated.   4. Right atrial size was severely dilated.   5. Prosthetic disc motion appears normal. Gradients are probably  increased due to tachycardia. The mitral valve has been repaired/replaced.  No evidence of mitral valve regurgitation. No evidence of mitral stenosis.  The mean mitral valve gradient is 8.0  mmHg with average heart rate of 107 bpm. There is a mechanical valve  present in the mitral position.   6. The tricuspid valve is abnormal. Tricuspid valve regurgitation is  severe.   7. The aortic valve is tricuspid. Aortic valve regurgitation is not  visualized. No aortic stenosis is present.   8. The inferior vena cava is dilated in size with <50% respiratory  variability, suggesting right atrial pressure of 15 mmHg.   R/LHC 01/09/21  Mid RCA lesion is 100% stenosed.   Ost Cx to Prox Cx lesion is 30% stenosed.   Dist Cx lesion is 30% stenosed.   Dist LAD lesion is 40% stenosed.   Findings:   Ao = 157/85 (114) LV = 150/20 RA = 9 RV = 51/9 PA = 48/19 (32) PCW = 14 Fick cardiac output/index = 5.4/2.9 PVR = 3.3 WU FA sat = 95% PA sat = 73%, 69%  Assessment: 1v CAD with chronic occlusion of mRCA Relatively well-compensated filling pressures with mild pulmonary HTN Normal cardiac  output   Difficult coronary ostia to engage due to tortuous subclavian artery. JL 3.5 use for left system LCB used for the RCA which had a high anterior takeoff    Patient Profile     71 y.o. female with a hx of mechanical MVR in 2001; CAD s/p CABG; history of breast cancer s/p L mastectomy, radiation, chemo; COPD with tobacco use; prior DVT/PE who is being followed for the evaluation of heart failure at the request of  Dr. Daryll Drown.  Assessment & Plan    Principal Problem:   Combined systolic and diastolic congestive heart failure (HCC) Active Problems:   Pulmonary embolism (HCC)   Chronic low back pain   Tobacco use disorder   COPD (chronic obstructive pulmonary disease) (HCC)   S/P MVR (mitral valve replacement)   Osteoarthritis of left knee   Combined systolic and diastolic heart failure (HCC)   CHF (congestive heart failure) (HCC)   Atypical atrial flutter (HCC)    Acute on chronic systolic and diastolic heart failure Mechanical MVR History of PE, reduced RV function, severe TR -pharmacy managing coumadin for mechanical valve, INR goal 3 (2.5-3.5) - 3.7 today.  -home HF regimen: farxiga 10 mg daily, furosemide 40 mg (was BID for short time prior to admission), hydralazine 25 mg TID, imdur 30 mg daily, metoprolol succinate 25 mg daily, simvastatin 20 mg daily, spironolactine 25 mg daily. Wilder Glade and spironolactone currently on hold due to acute kidney injury, renal function better today. - she may be a good candidate for torsemide homegoing given better bioavailability in the setting of right heart failure/overload. -Query tachy mediated reduction in EF. May feel better with inpatient TEE/DCCV prior to dismissal. Plan for Wednesday 9/6 after amio load. -transition to torsemide today. Admission weight 81.2 kg, weight today 77.6 kg. Last clinic weight 76.9 kg. 40 mg already give today, consider dropping dose to 20 mg tomorrow based on response today - review I/O.  Atypical atrial flutter -given her MVR, my suspicion is she has been in atypical atrial flutter for the last ~month, and this may be the reason for her volume overload -po amio initiated 400 mg BID. Will aim for 5 gram load and then TEE/DCCV. Will bolus amio to faciliate load. Consider TEE/DCCV inpatient on sept 6 or 7. If patient requires hospital dismissal that is reasonable however with potential for variability in INR and volume status,  may be more reasonable to attempt while inpatient. TEE indicated due to one low INR on presentation.  -will ask pharmacy for assistance with her coumadin dosing with the start of amiodarone   CAD -mRCA occluded. Reported prior history of CABG, but no grafts mentioned on cath report. CT (personally reviewed) demonstrates possible surgical clips at distal LAD and no definite LIMA visualized, query if it is occluded? Unclear if surgical material at ascending aorta is graft site vs aorta access.  - if ef remains low after restoration of SR, could consider ischemic evalation as outpt.  -not on antiplatelet as she is on coumadin -on simvastatin. Would consider intensifying if LDL >70.   Tobacco use: advised cessation  For questions or updates, please contact [place] Please consult [email or web address] for contact info under     Signed, Elouise Munroe, MD  02/22/2022, 8:39 AM

## 2022-02-22 NOTE — Progress Notes (Signed)
Mobility Specialist: Progress Note   02/22/22 1548  Mobility  Activity Ambulated with assistance in hallway  Level of Assistance Contact guard assist, steadying assist  Assistive Device None  Distance Ambulated (ft) 240 ft  Activity Response Tolerated well  $Mobility charge 1 Mobility   Received pt in bed having no complaints and agreeable to mobility. Pt was asymptomatic throughout ambulation and returned to room w/o fault. Left sitting EOB w/ call bell in reach and all needs met. Encouraged pt to ambulate independently or with family later this evening, RN aware.  [facility] Kim Anthony Mobility Specialist Mobility Specialist [address]: [phone]

## 2022-02-23 DIAGNOSIS — I5043 Acute on chronic combined systolic (congestive) and diastolic (congestive) heart failure: Secondary | ICD-10-CM | POA: Diagnosis not present

## 2022-02-23 DIAGNOSIS — I5082 Biventricular heart failure: Secondary | ICD-10-CM | POA: Diagnosis not present

## 2022-02-23 DIAGNOSIS — N1831 Chronic kidney disease, stage 3a: Secondary | ICD-10-CM | POA: Diagnosis not present

## 2022-02-23 DIAGNOSIS — Z952 Presence of prosthetic heart valve: Secondary | ICD-10-CM | POA: Diagnosis not present

## 2022-02-23 DIAGNOSIS — N179 Acute kidney failure, unspecified: Secondary | ICD-10-CM | POA: Diagnosis not present

## 2022-02-23 DIAGNOSIS — I484 Atypical atrial flutter: Secondary | ICD-10-CM | POA: Diagnosis not present

## 2022-02-23 DIAGNOSIS — F17200 Nicotine dependence, unspecified, uncomplicated: Secondary | ICD-10-CM | POA: Diagnosis not present

## 2022-02-23 LAB — BASIC METABOLIC PANEL
Anion gap: 13 (ref 5–15)
BUN: 27 mg/dL — ABNORMAL HIGH (ref 8–23)
CO2: 22 mmol/L (ref 22–32)
Calcium: 9.9 mg/dL (ref 8.9–10.3)
Chloride: 104 mmol/L (ref 98–111)
Creatinine, Ser: 1.18 mg/dL — ABNORMAL HIGH (ref 0.44–1.00)
GFR, Estimated: 49 mL/min — ABNORMAL LOW (ref >60)
Glucose, Bld: 107 mg/dL — ABNORMAL HIGH (ref 70–99)
Potassium: 4.4 mmol/L (ref 3.5–5.1)
Sodium: 139 mmol/L (ref 135–145)

## 2022-02-23 LAB — PROTIME-INR
INR: 4.2 (ref 0.8–1.2)
Prothrombin Time: 40 seconds — ABNORMAL HIGH (ref 11.4–15.2)

## 2022-02-23 LAB — MAGNESIUM: Magnesium: 2 mg/dL (ref 1.7–2.4)

## 2022-02-23 MED ORDER — TORSEMIDE 20 MG PO TABS
20.0000 mg | ORAL_TABLET | Freq: Every day | ORAL | Status: DC
  Administered 2022-02-24: 20 mg via ORAL
  Filled 2022-02-23: qty 1

## 2022-02-23 MED ORDER — NICOTINE 14 MG/24HR TD PT24
14.0000 mg | MEDICATED_PATCH | Freq: Every day | TRANSDERMAL | Status: DC
  Administered 2022-02-23: 14 mg via TRANSDERMAL
  Filled 2022-02-23: qty 1

## 2022-02-23 MED ORDER — NICOTINE 7 MG/24HR TD PT24
7.0000 mg | MEDICATED_PATCH | Freq: Every day | TRANSDERMAL | Status: DC
  Filled 2022-02-23: qty 1

## 2022-02-23 NOTE — Progress Notes (Signed)
Paged for altered mental status at 3:45, went bedside to evaluate.  Upon arrival, the patient was lying comfortably in bed and appeared slightly anxious. She was mildly tachycardic, unchanged from the previous 24hr, while other vitals were unremarkable. Lights and television had both been turned on. She was oriented to person, place, and year but not exact date. Although confused earlier in the night, she stated that she is "turning around" and now feels okay. She denied feeling anxious or worried and did not have any concerns.  Patient was agreeable with plan of trying to sleep until morning. Lights and television were turned off at the end of encounter.

## 2022-02-23 NOTE — Progress Notes (Signed)
Rounding Note    Patient Name: Kim Anthony Date of Encounter: 02/23/2022  [facility] Cardiologist: Dr. Haroldine Laws  Subjective   Overnight had episode of tachypnea/altered mental status per report. She remembers the events, thought someone was breaking in. Back to baseline this AM. She is craving a cigarette and asking when she can go home, but also understands the benefit to TEE/CV while admitted.   Inpatient Medications    Scheduled Meds:  amiodarone  400 mg Oral BID   arformoterol  15 mcg Nebulization BID   And   umeclidinium bromide  1 puff Inhalation Daily   dapagliflozin propanediol  10 mg Oral Daily   fluticasone  2 spray Each Nare BID   hydrALAZINE  25 mg Oral TID   isosorbide mononitrate  30 mg Oral Daily   metoprolol succinate  25 mg Oral Daily   montelukast  10 mg Oral QHS   nicotine  7 mg Transdermal Daily   potassium chloride  40 mEq Oral TID   ramelteon  8 mg Oral QHS   simvastatin  20 mg Oral Daily   torsemide  40 mg Oral Daily   Warfarin - Pharmacist Dosing Inpatient   Does not apply q1600   Continuous Infusions:  PRN Meds: albuterol, dextromethorphan-guaiFENesin, guaiFENesin-dextromethorphan, HYDROcodone-acetaminophen, polyethylene glycol, simethicone, sodium chloride   Vital Signs    Vitals:   02/22/22 1848 02/23/22 0209 02/23/22 0435 02/23/22 0732  BP:   113/81   Pulse:      Resp: 20     Temp:   97.8 F (36.6 C)   TempSrc:   Oral   SpO2:  98% 100% 98%  Weight:   76.7 kg   Height:        Intake/Output Summary (Last 24 hours) at 02/23/2022 0835 Last data filed at 02/23/2022 0816 Gross per 24 hour  Intake 720 ml  Output 1150 ml  Net -430 ml      02/23/2022    4:35 AM 02/22/2022    5:35 AM 02/21/2022    5:35 AM  Last 3 Weights  Weight (lbs) 169 lb 3.2 oz 171 lb 173 lb  Weight (kg) 76.749 kg 77.565 kg 78.472 kg      Telemetry    Atypical atrial flutter - Personally Reviewed  ECG    No new since 02/18/22 - Personally  Reviewed  Physical Exam   GEN: Well nourished, well developed in no acute distress NECK: No JVD CARDIAC: regular rhythm, normal S1 and crisp mechanical S2, no rubs or gallops. 2/6 SE murmur. VASCULAR: Radial pulses 2+ bilaterally.  RESPIRATORY:  Clear to auscultation without rales, wheezing or rhonchi  ABDOMEN: Soft, non-tender, non-distended MUSCULOSKELETAL:  Moves all 4 limbs independently SKIN: Warm and dry, no edema NEUROLOGIC:  No focal neuro deficits noted. PSYCHIATRIC:  Normal affect    Labs    High Sensitivity Troponin:   Recent Labs  Lab 02/04/22 0934 02/18/22 0217 02/18/22 0519  TROPONINIHS 15 18* 18*     Chemistry Recent Labs  Lab 02/18/22 0217 02/18/22 0355 02/20/22 0531 02/21/22 0322 02/21/22 1757 02/22/22 0234  NA 136   < > 134* 138  --  137  K 3.9   < > 3.1* 2.8* 3.4* 3.5  CL 96*   < > 99 100  --  103  CO2 21*   < > 23 26  --  22  GLUCOSE 89   < > 99 102*  --  111*  BUN 29*   < >  41* 35*  --  28*  CREATININE 1.94*   < > 1.28* 1.10*  --  1.07*  CALCIUM 9.4   < > 8.6* 9.2  --  9.1  MG  --    < > 2.2 1.9  --  2.0  PROT 7.2  --   --   --   --   --   ALBUMIN 3.2*  --   --   --   --   --   AST 35  --   --   --   --   --   ALT 17  --   --   --   --   --   ALKPHOS 139*  --   --   --   --   --   BILITOT 1.7*  --   --   --   --   --   GFRNONAA 27*   < > 45* 53*  --  55*  ANIONGAP 19*   < > 12 12  --  12   < > = values in this interval not displayed.    Lipids  Recent Labs  Lab 02/20/22 0531  CHOL 93  TRIG 110  HDL 27*  LDLCALC 44  CHOLHDL 3.4    Hematology Recent Labs  Lab 02/18/22 0217 02/18/22 0355 02/18/22 0519 02/21/22 0322  WBC 9.0  --  7.1 7.0  RBC 3.97  --  3.98 4.24  HGB 12.3 12.6 12.0 12.5  HCT 37.9 37.0 38.2 39.8  MCV 95.5  --  96.0 93.9  MCH 31.0  --  30.2 29.5  MCHC 32.5  --  31.4 31.4  RDW 13.8  --  13.7 13.7  PLT 314  --  305 317   Thyroid No results for input(s): "TSH", "FREET4" in the last 168 hours.  BNP Recent  Labs  Lab 02/18/22 0217  BNP 367.0*    DDimer No results for input(s): "DDIMER" in the last 168 hours.   Radiology    No results found.  Cardiac Studies   Echo 02/18/22 1. Left ventricular ejection fraction, by estimation, is 35 to 40%. The  left ventricle has moderately decreased function. The left ventricle  demonstrates global hypokinesis. Left ventricular diastolic function could  not be evaluated.   2. Right ventricular systolic function is severely reduced. The right  ventricular size is severely enlarged. There is normal pulmonary artery  systolic pressure. The estimated right ventricular systolic pressure is  35.0 mmHg.   3. Left atrial size was severely dilated.   4. Right atrial size was severely dilated.   5. Prosthetic disc motion appears normal. Gradients are probably  increased due to tachycardia. The mitral valve has been repaired/replaced.  No evidence of mitral valve regurgitation. No evidence of mitral stenosis.  The mean mitral valve gradient is 8.0  mmHg with average heart rate of 107 bpm. There is a mechanical valve  present in the mitral position.   6. The tricuspid valve is abnormal. Tricuspid valve regurgitation is  severe.   7. The aortic valve is tricuspid. Aortic valve regurgitation is not  visualized. No aortic stenosis is present.   8. The inferior vena cava is dilated in size with <50% respiratory  variability, suggesting right atrial pressure of 15 mmHg.   R/LHC 01/09/21  Mid RCA lesion is 100% stenosed.   Ost Cx to Prox Cx lesion is 30% stenosed.   Dist Cx lesion is 30% stenosed.   Dist LAD  lesion is 40% stenosed.   Findings:   Ao = 157/85 (114) LV = 150/20 RA = 9 RV = 51/9 PA = 48/19 (32) PCW = 14 Fick cardiac output/index = 5.4/2.9 PVR = 3.3 WU FA sat = 95% PA sat = 73%, 69%  Assessment: 1v CAD with chronic occlusion of mRCA Relatively well-compensated filling pressures with mild pulmonary HTN Normal cardiac output    Difficult coronary ostia to engage due to tortuous subclavian artery. JL 3.5 use for left system LCB used for the RCA which had a high anterior takeoff    Patient Profile     71 y.o. female with a hx of mechanical MVR in 2001; CAD s/p CABG; history of breast cancer s/p L mastectomy, radiation, chemo; COPD with tobacco use; prior DVT/PE who is being followed for the evaluation of heart failure at the request of Dr. Daryll Drown.  Assessment & Plan    Acute on chronic systolic and diastolic heart failure Mechanical MVR History of PE, reduced RV function, severe TR -I personally reviewed her echo. Tricuspid valve does not coapt, leading to severe TR. Suspect PASP not accurate due to equalization of RV/RA pressures. Mechanical mitral valve appears stable, with mean gradient of 8 mmHg at 107 bpm. Severe biatrial enlargement. Abnormal septal motion c/w RV pressure/volume overload. Severe reduction in RV function.  -pharmacy managing coumadin for mechanical valve, INR goal 2.5-3.5 -home HF regimen: farxiga 10 mg daily, furosemide 40 mg (was BID for short time prior to admission), hydralazine 25 mg TID, imdur 30 mg daily, metoprolol succinate 25 mg daily, simvastatin 20 mg daily, spironolactine 25 mg daily. Wilder Glade and spironolactone currently on hold due to acute kidney injury -her EF has varied over the last few echoes. She denies chest pain. hsTn low and flat. Given her presentation and her mechanical valve (which would require bridging), I think it is reasonable to hold off on cath at this time. She has known CAD as below -Admission weight 81.2 kg, weight today 76.7 kg. Last clinic weight 76.9 kg. -continue oral torsemide. May need lower dose once back in sinus rhythm. -Cr improved to 1.07, K has been intermittently low, replete PRN -discussed TEE/CV 9/6, needs to be scheduled  Atypical atrial flutter -on reviewing her telemetry pattern, she has almost no heart rate variability. This has been noted on  ECG back to 12/2021, prior to that was clearly sinus rhythm -given her MVR, my suspicion is she has been in atypical atrial flutter for the last ~month, and this may be the reason for her volume overload -continue amiodarone load -appreciate pharmacy assistance with her coumadin dosing with the start of amiodarone   CAD -mRCA occluded. Reported prior history of CABG, but no grafts mentioned on cath report. I personally reviewed the films. I also reviewed her three most recent Cts and cannot clearly see a graft. Does have surgical clips. -not on antiplatelet as she is on coumadin -on simvastatin. LDL 44.   Tobacco use: advised cessation. Using patch (not on currently), will step down dose to 7 mg as it has been almost a week at the 14 mg dose.  For questions or updates, please contact [place] Please consult [email or web address] for contact info under     Signed, Buford Dresser, MD  02/23/2022, 8:35 AM

## 2022-02-23 NOTE — Progress Notes (Signed)
ANTICOAGULATION CONSULT NOTE - Follow Up  Pharmacy Consult for Coumadin Indication: mechanical mitral valve   Allergies  Allergen Reactions   Lisinopril Swelling   Acetaminophen-Codeine Itching   Propoxyphene Itching    Darvocet   Tape Rash    Plastic   Tramadol Itching, Nausea And Vomiting and Rash    Patient Measurements: Height: '5\' 5"'$  (165.1 cm) Weight: 76.7 kg (169 lb 3.2 oz) IBW/kg (Calculated) : 57   Vital Signs: Temp: 98.4 F (36.9 C) (09/04 0851) Temp Source: Oral (09/04 0851) BP: 142/93 (09/04 0851) Pulse Rate: 110 (09/04 0851)  Labs: Recent Labs    02/21/22 0322 02/22/22 0234 02/23/22 0437 02/23/22 0711  HGB 12.5  --   --   --   HCT 39.8  --   --   --   PLT 317  --   --   --   LABPROT 32.7* 36.0* 40.0*  --   INR 3.2* 3.7* 4.2*  --   CREATININE 1.10* 1.07*  --  1.18*     Estimated Creatinine Clearance: 44.2 mL/min (A) (by C-G formula based on SCr of 1.18 mg/dL (H)).   Medical History: Past Medical History:  Diagnosis Date   Arthritis    Asthma    Breast cancer ([place]) 2001   Left Breast Cancer   CHF (congestive heart failure) (HCC)    COPD (chronic obstructive pulmonary disease) (HCC)    Coronary artery disease    GERD (gastroesophageal reflux disease)    History of mitral valve replacement with mechanical valve    HLD (hyperlipidemia)    Hypertension    Pulmonary embolism ([place]) 2021    Medications:  No current facility-administered medications on file prior to encounter.   Current Outpatient Medications on File Prior to Encounter  Medication Sig Dispense Refill   albuterol (PROVENTIL) (2.5 MG/3ML) 0.083% nebulizer solution USE 1 VIAL IN NEBULIZER EVERY 6 HOURS AS NEEDED FOR WHEEZING FOR SHORTNESS OF BREATH (Patient taking differently: Take 2.5 mg by nebulization every 6 (six) hours as needed for shortness of breath.) 90 mL 2   albuterol (VENTOLIN HFA) 108 (90 Base) MCG/ACT inhaler Inhale 2 puffs into the lungs every 6 (six) hours as  needed for wheezing or shortness of breath. 8 g 2   diphenhydrAMINE (BENADRYL) 25 MG tablet Take 75 mg by mouth daily as needed for allergies.     docusate sodium (COLACE) 100 MG capsule Take 1 capsule (100 mg total) by mouth 2 (two) times daily as needed for mild constipation. (Patient taking differently: Take 100 mg by mouth daily as needed for mild constipation.) 30 capsule 1   FARXIGA 10 MG TABS tablet Take 1 tablet by mouth once daily 90 tablet 0   fluticasone (FLONASE) 50 MCG/ACT nasal spray Place 1 spray into both nostrils daily as needed for allergies. 9.9 mL 2   furosemide (LASIX) 40 MG tablet Take 1 tablet (40 mg total) by mouth 2 (two) times daily for 5 days. Then return to your normal dose of 1 tablet daily (Patient taking differently: Take 40 mg by mouth daily.) 45 tablet 3   gabapentin (NEURONTIN) 600 MG tablet TAKE 1 & 1/2 (ONE & ONE-HALF) TABLETS BY MOUTH THREE TIMES DAILY FOR  NERVE  PAIN (Patient taking differently: Take 900 mg by mouth 3 (three) times daily. Take 1 and 1/2 tablets by mouth three times a day for nerve pain) 150 tablet 5   hydrALAZINE (APRESOLINE) 25 MG tablet Take 1 tablet (25 mg total) by mouth  3 (three) times daily. 90 tablet 11   HYDROcodone-acetaminophen (NORCO) 5-325 MG tablet Take 1 tablet by mouth every 4 (four) hours as needed for moderate pain. 150 tablet 0   isosorbide mononitrate (IMDUR) 30 MG 24 hr tablet Take 1 tablet (30 mg total) by mouth daily. 90 tablet 2   loratadine (CLARITIN) 10 MG tablet Take 1 tablet (10 mg total) by mouth daily. 90 tablet 0   metoprolol succinate (TOPROL-XL) 25 MG 24 hr tablet Take 1 tablet (25 mg total) by mouth daily. 90 tablet 2   montelukast (SINGULAIR) 10 MG tablet Take 1 tablet (10 mg total) by mouth at bedtime. 90 tablet 2   polyethylene glycol (MIRALAX / GLYCOLAX) 17 g packet Take 17 g by mouth daily. (Patient taking differently: Take 17 g by mouth daily as needed for mild constipation.) 14 each 0   Simethicone (GAS-X  PO) Take 1 tablet by mouth daily.     simvastatin (ZOCOR) 20 MG tablet Take 1 tablet (20 mg total) by mouth daily. 90 tablet 2   spironolactone (ALDACTONE) 25 MG tablet Take 1 tablet by mouth once daily (Patient taking differently: Take 25 mg by mouth daily.) 90 tablet 3   Tiotropium Bromide-Olodaterol (STIOLTO RESPIMAT) 2.5-2.5 MCG/ACT AERS INHALE 2 PUFFS BY MOUTH ONCE DAILY (Patient taking differently: Inhale 2 puffs by mouth daily) 4 g 0   warfarin (COUMADIN) 5 MG tablet TAKE 1& 1/2  TABLETS BY MOUTH ONCE DAILY AT 4 PM. (Patient taking differently: Take 5 mg by mouth at bedtime.) 42 tablet 1   famotidine (PEPCID) 20 MG tablet Take 1 tablet by mouth twice daily (Patient not taking: Reported on 02/18/2022) 60 tablet 0   nicotine (NICODERM CQ - DOSED IN MG/24 HOURS) 21 mg/24hr patch Place 1 patch (21 mg total) onto the skin daily. (Patient not taking: Reported on 02/18/2022) 30 patch 2   potassium chloride SA (KLOR-CON M) 20 MEQ tablet Take 2 tablets (40 mEq total) by mouth daily. (Patient taking differently: Take 20 mEq by mouth daily.) 30 tablet 6   Spacer/Aero-Holding Chambers (BREATHERITE COLL SPACER ADULT) MISC 1 applicator by Does not apply route daily. 1 each 0     Assessment: 71 y.o. female with h/o mechanical MVR and PE , on  Coumadin.   Goal INR is 2.5-3.5. INR on admission was slightly subtherapeutic at 2.2 on 02/18/22.  Noted that pt has had multiple recent dose adjustments to Coumadin regimen prior to admission due to previous supra-therapeutic INR results- most recent PTA regimen at 5 mg daily.  INR today increased up to 4.2 today. Hgb 12.5, plt 317 on last check 9/2. Will monitor drug interaction closely since amiodarone added. Oral intake documented at 100% on 9/3.  Goal of Therapy:  INR 2.5-3.5 Monitor platelets by anticoagulation protocol: Yes   Plan:  Hold warfarin tonight Daily INR Monitor for s/sx bleeding  Eliseo Gum, PharmD PGY1 Pharmacy Resident   02/23/2022   11:32 AM

## 2022-02-23 NOTE — Care Management Important Message (Signed)
Important Message  Patient Details  Name: Kim Anthony [name]: [id number] Date of Birth: 03-02-1951   Medicare Important Message Given:  Yes     Shelda Altes 02/23/2022, 8:39 AM

## 2022-02-23 NOTE — Progress Notes (Signed)
0200: Pt sitting on edge of bed, shaking, tachypneic in 30s and disoriented. Pt stating she needs to go to the hospital. RN unsuccessfully attempted to reorient patient and called RT for breathing treatment.  0330: RN notified pt is still confused by other staff. Pt again found sitting on edge of bed, very anxious and calling multiple family members. Pt breathing has slowed, she is no longer shaking and she is more redirectable, but still AMS compared to earlier assessments. MD notified.

## 2022-02-23 NOTE — Progress Notes (Signed)
Patient is requesting a cigarette, says she is leaving AMA if we don't give her one. She is searching in the trash can, under the bed, her purse and every where else. MD is made aware.

## 2022-02-23 NOTE — Progress Notes (Addendum)
HD#4 SUBJECTIVE:  Patient Summary: 71 y.o. female living with a PMHx of CHF post MV replacement, COPD, PEs, and hypertension who presented with shortness of breath and admitted for acute decompensated heart failure exacerbation.  Overnight Events:  Episode of delirium overnight Patient expressed frustration at being in the hospital and not being able to smoke.    Interm History:  Patient seen and evaluated at bedside. She expresses significant frustration with being in the hospital, particularly inability to ambulate as she wishes and inability to smoke a cigarette. She states that she wants to leave because she feels judged and does not feel like she is being treated with dignity. Reassured patient that staff is not judging her. We will loosen restrictions while admitted.  OBJECTIVE:  Vital Signs: Blood pressure (!) 156/111, pulse (!) 111, temperature 97.9 F (36.6 C), temperature source Oral, resp. rate 20, height '5\' 5"'$  (1.651 m), weight 76.7 kg, SpO2 96 %.  Vitals:   02/23/22 0435 02/23/22 0732 02/23/22 0851 02/23/22 1141  BP: 113/81  (!) 142/93 (!) 156/111  Pulse:   (!) 110 (!) 111  Resp:   20 20  Temp: 97.8 F (36.6 C)  98.4 F (36.9 C) 97.9 F (36.6 C)  TempSrc: Oral  Oral Oral  SpO2: 100% 98% 99% 96%  Weight: 76.7 kg     Height:       Supplemental O2:  SpO2: 96 % O2 Flow Rate (L/min): 2 L/min FiO2 (%): 21 %  Filed Weights   02/21/22 0535 02/22/22 0535 02/23/22 0435  Weight: 78.5 kg 77.6 kg 76.7 kg     Intake/Output Summary (Last 24 hours) at 02/23/2022 1439 Last data filed at 02/23/2022 0816 Gross per 24 hour  Intake 480 ml  Output 500 ml  Net -20 ml   Net IO Since Admission: -1,840 mL [02/23/22 1439]  Physical Exam:  Constitutional: alert, sitting in bed, in no acute distress  HENT: normocephalic atraumatic   Cardiovascular: tachycardic rate, regular rhythm. No JVD Pulmonary: normal work of breathing,  clear to auscultation bilaterally MSK: normal bulk  and tone Neurological: alert & oriented x 3 Skin: warm and dry, no LE edema  Psych: normal mood and behavior   Pertinent Labs:    Latest Ref Rng & Units 02/21/2022    3:22 AM 02/18/2022    5:19 AM 02/18/2022    3:55 AM  CBC  WBC 4.0 - 10.5 K/uL 7.0  7.1    Hemoglobin 12.0 - 15.0 g/dL 12.5  12.0  12.6   Hematocrit 36.0 - 46.0 % 39.8  38.2  37.0   Platelets 150 - 400 K/uL 317  305         Latest Ref Rng & Units 02/23/2022    7:11 AM 02/22/2022    2:34 AM 02/21/2022    5:57 PM  CMP  Glucose 70 - 99 mg/dL 107  111    BUN 8 - 23 mg/dL 27  28    Creatinine 0.44 - 1.00 mg/dL 1.18  1.07    Sodium 135 - 145 mmol/L 139  137    Potassium 3.5 - 5.1 mmol/L 4.4  3.5  3.4   Chloride 98 - 111 mmol/L 104  103    CO2 22 - 32 mmol/L 22  22    Calcium 8.9 - 10.3 mg/dL 9.9  9.1      ASSESSMENT/PLAN:  Assessment: Principal Problem:   Combined systolic and diastolic congestive heart failure (HCC) Active Problems:   Pulmonary  embolism (HCC)   Chronic low back pain   Tobacco use disorder   COPD (chronic obstructive pulmonary disease) (HCC)   S/P MVR (mitral valve replacement)   Osteoarthritis of left knee   Combined systolic and diastolic heart failure (HCC)   CHF (congestive heart failure) (HCC)   Atypical atrial flutter ([place])   71 y.o. female living with a PMHx of CHF post MV replacement, COPD, PEs, and hypertension who presented with shortness of breath and admitted for acute decompensated heart failure exacerbation.  # Acute on chronic HFrEF exacerbation LVEF 35-40% # Biventricular failure Cardiology believes volume overload may be secondary to atypical atrial flutter. Oral torsemide started yesterday. 100% on RA now. And appears to be at dry weight. > oral torsemide decreased to '20mg'$  > PO amiodarone 400 mg BID with plans for TEE/DCCV 9/6 per cardio recommendation > Continue hydralazine and Imdur > Toprol XL  > Follow up in HF clinic   Atypical atrial flutter Cardiology suspecting  that patient has been in atypical atrial flutter given minimal heart rate variability on telemetry likely related to MVR. Pt receiving PO amiodarone. - continue amiodarone - continue telemetry - cardiology recommending inpatient TEE/DCCV prior to discharge with likely date 9/6   Hx of PE Hx of MV replacement Goal INR 2.5-3.5 given mechanical MV. Follows Dr. Elie Confer. Pt currently on Warfarin per pharmacy. Cardiology arranging for TEE as she was subtherapeutic on admission. INR noted to be 4.2 today. Suspect warfarin metabolism impaired due to amiodarone.  > Continue warfarin per pharmacy. Warfarin being held today > recheck PT/INR in AM   AKI on CKD IIIA Improved with diuresis. > strict I+Os > daily BMP   COPD On Stiolto respimat, albuterol, and montelukast at home. Does not follow with pulmonologist. No respiratory distress or wheezing on exam. > Continue albuterol and montelukast > Brovana nebulizer and incruse ellipta > Possible pulmonology referral as outpatient   Chronic low back pain No complaints of back pain today. Self-reported history of radiculopathy and sciatic nerve pain. Prescribed Norco 5-'325mg'$  q4h PRN at home. States she has been doubling up on dose for adequate control prior to admission.  > continue Norco 10-650 mg Q4 PRN   Tobacco use disorder Smokes 6 cigarettes a day for the last 30 years >continue nicotine patch  # Delirium 1 episode of delirium overnight. Self resolved Patient frustrated this morning from restrictions that have been in place while admitted. - delirium precautions > loosened restrictions. Pt up out of bed as able, and tele discontinued. Bed alarm off.   Diet: Heart Healthy, Carb-modified VTE: Coumadin IVF: None,None Code: Full Prior to Admission Living Arrangement: Home, living with daughter Anticipated Discharge Location: Home Barriers to Discharge: CHF exacerbation Dispo: Admit patient to inpatient   Signature: Delene Ruffini, MD

## 2022-02-23 NOTE — Progress Notes (Signed)
Mobility Specialist Progress Note:   02/23/22 1422  Mobility  Activity Ambulated with assistance in hallway  Level of Assistance Standby assist, set-up cues, supervision of patient - no hands on  Assistive Device None  Distance Ambulated (ft) 230 ft  Activity Response Tolerated well  $Mobility charge 1 Mobility   Pt received at doorway asking for RN. No complaints of pain.Left EOB with call bell in reach and all needs met.   [facility] Lache Dagher Mobility Specialist

## 2022-02-23 NOTE — Progress Notes (Signed)
Rounding Note    Patient Name: Kim Anthony Date of Encounter: 02/24/2022  [place] Cardiologist: None   Subjective   Feeling much better. No chest pain. Breathing improved.  Inpatient Medications    Scheduled Meds:  amiodarone  400 mg Oral BID   arformoterol  15 mcg Nebulization BID   And   umeclidinium bromide  1 puff Inhalation Daily   dapagliflozin propanediol  10 mg Oral Daily   fluticasone  2 spray Each Nare BID   hydrALAZINE  25 mg Oral TID   isosorbide mononitrate  30 mg Oral Daily   metoprolol succinate  25 mg Oral Daily   montelukast  10 mg Oral QHS   nicotine  7 mg Transdermal Daily   ramelteon  8 mg Oral QHS   simvastatin  20 mg Oral Daily   torsemide  20 mg Oral Daily   Warfarin - Pharmacist Dosing Inpatient   Does not apply q1600   Continuous Infusions:  PRN Meds: albuterol, dextromethorphan-guaiFENesin, guaiFENesin-dextromethorphan, HYDROcodone-acetaminophen, polyethylene glycol, simethicone, sodium chloride   Vital Signs    Vitals:   02/23/22 2035 02/24/22 0250 02/24/22 0550 02/24/22 0807  BP: 111/86 114/80  101/84  Pulse: 89 93  (!) 107  Resp: 20 20    Temp: 98.1 F (36.7 C) 98.2 F (36.8 C)  97.8 F (36.6 C)  TempSrc: Oral Oral  Oral  SpO2: 94% 91%  96%  Weight:   75.8 kg   Height:        Intake/Output Summary (Last 24 hours) at 02/24/2022 0847 Last data filed at 02/24/2022 7001 Gross per 24 hour  Intake 657 ml  Output --  Net 657 ml      02/24/2022    5:50 AM 02/23/2022    4:35 AM 02/22/2022    5:35 AM  Last 3 Weights  Weight (lbs) 167 lb 169 lb 3.2 oz 171 lb  Weight (kg) 75.751 kg 76.749 kg 77.565 kg      Telemetry    Aflutter with HR 100s, occasional PVCs - Personally Reviewed  ECG    No new tracing - Personally Reviewed  Physical Exam   GEN: No acute distress.   Neck: No JVD Cardiac: Tachycardic, regular, +mechanical click Respiratory: Clear to auscultation bilaterally. GI: Soft, nontender, non-distended   MS: No edema; No deformity. Neuro:  Nonfocal  Psych: Normal affect   Labs    High Sensitivity Troponin:   Recent Labs  Lab 02/04/22 0934 02/18/22 0217 02/18/22 0519  TROPONINIHS 15 18* 18*     Chemistry Recent Labs  Lab 02/18/22 0217 02/18/22 0355 02/22/22 0234 02/23/22 0711 02/24/22 0246  NA 136   < > 137 139 137  K 3.9   < > 3.5 4.4 4.1  CL 96*   < > 103 104 102  CO2 21*   < > '22 22 24  '$ GLUCOSE 89   < > 111* 107* 92  BUN 29*   < > 28* 27* 24*  CREATININE 1.94*   < > 1.07* 1.18* 1.13*  CALCIUM 9.4   < > 9.1 9.9 9.4  MG  --    < > 2.0 2.0 2.0  PROT 7.2  --   --   --   --   ALBUMIN 3.2*  --   --   --   --   AST 35  --   --   --   --   ALT 17  --   --   --   --  ALKPHOS 139*  --   --   --   --   BILITOT 1.7*  --   --   --   --   GFRNONAA 27*   < > 55* 49* 52*  ANIONGAP 19*   < > '12 13 11   '$ < > = values in this interval not displayed.    Lipids  Recent Labs  Lab 02/20/22 0531  CHOL 93  TRIG 110  HDL 27*  LDLCALC 44  CHOLHDL 3.4    Hematology Recent Labs  Lab 02/18/22 0217 02/18/22 0355 02/18/22 0519 02/21/22 0322  WBC 9.0  --  7.1 7.0  RBC 3.97  --  3.98 4.24  HGB 12.3 12.6 12.0 12.5  HCT 37.9 37.0 38.2 39.8  MCV 95.5  --  96.0 93.9  MCH 31.0  --  30.2 29.5  MCHC 32.5  --  31.4 31.4  RDW 13.8  --  13.7 13.7  PLT 314  --  305 317   Thyroid No results for input(s): "TSH", "FREET4" in the last 168 hours.  BNP Recent Labs  Lab 02/18/22 0217  BNP 367.0*    DDimer No results for input(s): "DDIMER" in the last 168 hours.   Radiology    No results found.  Cardiac Studies   Echo 02/18/22 1. Left ventricular ejection fraction, by estimation, is 35 to 40%. The  left ventricle has moderately decreased function. The left ventricle  demonstrates global hypokinesis. Left ventricular diastolic function could  not be evaluated.   2. Right ventricular systolic function is severely reduced. The right  ventricular size is severely enlarged. There is  normal pulmonary artery  systolic pressure. The estimated right ventricular systolic pressure is  45.0 mmHg.   3. Left atrial size was severely dilated.   4. Right atrial size was severely dilated.   5. Prosthetic disc motion appears normal. Gradients are probably  increased due to tachycardia. The mitral valve has been repaired/replaced.  No evidence of mitral valve regurgitation. No evidence of mitral stenosis.  The mean mitral valve gradient is 8.0  mmHg with average heart rate of 107 bpm. There is a mechanical valve  present in the mitral position.   6. The tricuspid valve is abnormal. Tricuspid valve regurgitation is  severe.   7. The aortic valve is tricuspid. Aortic valve regurgitation is not  visualized. No aortic stenosis is present.   8. The inferior vena cava is dilated in size with <50% respiratory  variability, suggesting right atrial pressure of 15 mmHg.   R/LHC 01/09/21  Mid RCA lesion is 100% stenosed.   Ost Cx to Prox Cx lesion is 30% stenosed.   Dist Cx lesion is 30% stenosed.   Dist LAD lesion is 40% stenosed.   Findings:   Ao = 157/85 (114) LV = 150/20 RA = 9 RV = 51/9 PA = 48/19 (32) PCW = 14 Fick cardiac output/index = 5.4/2.9 PVR = 3.3 WU FA sat = 95% PA sat = 73%, 69%   Assessment: 1v CAD with chronic occlusion of mRCA Relatively well-compensated filling pressures with mild pulmonary HTN Normal cardiac output   Difficult coronary ostia to engage due to tortuous subclavian artery. JL 3.5 use for left system LCB used for the RCA which had a high anterior takeoff    Patient Profile     71 y.o. female  with a hx of mechanical MVR in 2001; CAD s/p CABG; history of breast cancer s/p L mastectomy, radiation, chemo; COPD with  tobacco use; prior DVT/PE who presented with worsening SOB and LE edema found to have acute on chronic systolic HF exacerbation for which Cardiology was consulted.   Assessment & Plan    #Acute on Chronic Systolic HF: #Severe RV  dysfunction: Patient presented with worsening SOB and LE edema found to have acute on chronic systolic HF exacerbation. BNP 367. Possibly related to persistent atrial flutter. TTE 02/18/22 with LVEF 35-40%, global hypokinesis, severe RV dysfunction with severe RV enlargement, mean mitral valve gradient 71mHg, severe TR. Has responded well to IV diuresis now transitioned to torsemide '20mg'$  daily. Wt stable 169>167lbs. - Continue torsemide '20mg'$  daily - Continue Toprol XL 25 mg daily. - Continue hydralazine 25 mg tid + Imdur 30 mg daily - Continue Farxiga 10 mg daily - Resume spiro 12.'5mg'$  daily - H/o angioedema with ACE-I    #History of Mechanical MVR: History of MVR in 2001. Last TTE with mean mitral gradient 827mg but HR 107. On Warfarin. -Continue warfarin -Continue metoprolol 25 XL daily -SBE ppx  #Atypical Atrial Flutter: CHADs-vasc 5. HR persistently in 100s with no significant HR variability. Will plan for TEE/DCCV tentatively tomorrow pending INR. -Continue amiodarone load -Continue warfarin per pharmacy -Tentatively plan for TEE/DCCV tomorrow pending INR  #CAD: Occluded mRCA on cath. Reportedly with history of CABG but grafts not mentioned on prior cath and not clearly seen on CT. No current anginal symptoms. Will continue medical therapy. -Not on ASA due to need [facility]  #Tobacco Abuse: -Encourage cessation   [facility] has been requested to perform a transesophageal echocardiogram with possible DCCV on BeShaune Pascalor Aflutter.  After careful review of history and examination, the risks and benefits of transesophageal echocardiogram have been explained including risks of esophageal damage, perforation (1:10,000 risk), bleeding, pharyngeal hematoma as well as other potential complications associated with conscious sedation including aspiration, arrhythmia, respiratory failure and death. Alternatives to treatment were discussed, questions were answered.  Patient is willing to proceed.   For questions or updates, please contact [place] consult [email or web address] for contact info under        Signed, HeFreada BergeronMD  02/24/2022, 8:47 AM

## 2022-02-24 DIAGNOSIS — I5082 Biventricular heart failure: Secondary | ICD-10-CM | POA: Diagnosis not present

## 2022-02-24 DIAGNOSIS — Z952 Presence of prosthetic heart valve: Secondary | ICD-10-CM | POA: Diagnosis not present

## 2022-02-24 DIAGNOSIS — F17200 Nicotine dependence, unspecified, uncomplicated: Secondary | ICD-10-CM | POA: Diagnosis not present

## 2022-02-24 DIAGNOSIS — I5043 Acute on chronic combined systolic (congestive) and diastolic (congestive) heart failure: Secondary | ICD-10-CM | POA: Diagnosis not present

## 2022-02-24 DIAGNOSIS — F17210 Nicotine dependence, cigarettes, uncomplicated: Secondary | ICD-10-CM | POA: Diagnosis not present

## 2022-02-24 DIAGNOSIS — I484 Atypical atrial flutter: Secondary | ICD-10-CM | POA: Diagnosis not present

## 2022-02-24 LAB — BASIC METABOLIC PANEL
Anion gap: 11 (ref 5–15)
Anion gap: 14 (ref 5–15)
BUN: 24 mg/dL — ABNORMAL HIGH (ref 8–23)
BUN: 25 mg/dL — ABNORMAL HIGH (ref 8–23)
CO2: 24 mmol/L (ref 22–32)
CO2: 24 mmol/L (ref 22–32)
Calcium: 9.4 mg/dL (ref 8.9–10.3)
Calcium: 9.8 mg/dL (ref 8.9–10.3)
Chloride: 102 mmol/L (ref 98–111)
Chloride: 102 mmol/L (ref 98–111)
Creatinine, Ser: 1.13 mg/dL — ABNORMAL HIGH (ref 0.44–1.00)
Creatinine, Ser: 1.23 mg/dL — ABNORMAL HIGH (ref 0.44–1.00)
GFR, Estimated: 52 mL/min — ABNORMAL LOW (ref >60)
GFR, Estimated: 47 mL/min — ABNORMAL LOW (ref >60)
Glucose, Bld: 92 mg/dL (ref 70–99)
Glucose, Bld: 88 mg/dL (ref 70–99)
Potassium: 4.1 mmol/L (ref 3.5–5.1)
Potassium: 3.9 mmol/L (ref 3.5–5.1)
Sodium: 137 mmol/L (ref 135–145)
Sodium: 140 mmol/L (ref 135–145)

## 2022-02-24 LAB — CBC
HCT: 41.6 % (ref 36.0–46.0)
Hemoglobin: 13.4 g/dL (ref 12.0–15.0)
MCH: 30.2 pg (ref 26.0–34.0)
MCHC: 32.2 g/dL (ref 30.0–36.0)
MCV: 93.9 fL (ref 80.0–100.0)
Platelets: 361 10*3/uL (ref 150–400)
RBC: 4.43 MIL/uL (ref 3.87–5.11)
RDW: 13.8 % (ref 11.5–15.5)
WBC: 7.6 10*3/uL (ref 4.0–10.5)
nRBC: 0 % (ref 0.0–0.2)

## 2022-02-24 LAB — PROTIME-INR
INR: 4.7 (ref 0.8–1.2)
Prothrombin Time: 44.1 seconds — ABNORMAL HIGH (ref 11.4–15.2)

## 2022-02-24 LAB — MAGNESIUM: Magnesium: 2 mg/dL (ref 1.7–2.4)

## 2022-02-24 MED ORDER — NICOTINE 7 MG/24HR TD PT24
7.0000 mg | MEDICATED_PATCH | Freq: Every day | TRANSDERMAL | Status: DC
Start: 2022-02-24 — End: 2022-02-26
  Administered 2022-02-24: 7 mg via TRANSDERMAL
  Filled 2022-02-24 (×3): qty 1

## 2022-02-24 MED ORDER — SPIRONOLACTONE 12.5 MG HALF TABLET
12.5000 mg | ORAL_TABLET | Freq: Every day | ORAL | Status: DC
  Administered 2022-02-24: 12.5 mg via ORAL
  Filled 2022-02-24 (×2): qty 1

## 2022-02-24 NOTE — Telephone Encounter (Signed)
When is it due, based on being in the hospital? I want to make sure she has some when she leaves hospital (as long as not in hospital for overdose)  but is she due for it, in general? How long has she been in the hospital, etc? ML

## 2022-02-24 NOTE — Telephone Encounter (Signed)
Patient is calling today requesting Hydrocodone. It looks like she is currently admitted in the hospital

## 2022-02-24 NOTE — Progress Notes (Signed)
Occupational Therapy Treatment Patient Details Name: Kim Anthony [name]: [id number] DOB: 01-02-1951 Today's Date: 02/24/2022   History of present illness Pt is 71 yo female who presented for SOB. PMH includes: chronic HFpEF (last EF 50-55%) with mitral valve replacement on chronic coumadin, COPD, CAD, history of pulmonary embolism.   OT comments  Patient seated on EOB upon entry. Patient was supervision to stand from EOB and ambulate to sink and perform grooming and UB bathing tasks without seated rest break. Patient returned to EOB and was instructed in BUE strengthening exercises with yellow therapy band to increase functional strength and activity tolerance. Acute OT to continue to follow.    Recommendations for follow up therapy are one component of a multi-disciplinary discharge planning process, led by the attending physician.  Recommendations may be updated based on patient status, additional functional criteria and insurance authorization.    Follow Up Recommendations  No OT follow up    Assistance Recommended at Discharge PRN  Patient can return home with the following  Help with stairs or ramp for entrance   Equipment Recommendations  None recommended by OT    Recommendations for Other Services      Precautions / Restrictions Precautions Precautions: Fall Restrictions Weight Bearing Restrictions: No       Mobility Bed Mobility Overal bed mobility: Modified Independent             General bed mobility comments: seated on EOB upon entry and at end of session.    Transfers Overall transfer level: Modified independent Equipment used: None Transfers: Sit to/from Stand Sit to Stand: Modified independent (Device/Increase time)           General transfer comment: stood and ambulated to sink for grooming tasks and returned to EOB     Balance                                           ADL either performed or assessed with clinical judgement    ADL Overall ADL's : Needs assistance/impaired     Grooming: Wash/dry hands;Wash/dry face;Oral care;Supervision/safety;Standing Grooming Details (indicate cue type and reason): standing at sink Upper Body Bathing: Supervision/ safety;Standing Upper Body Bathing Details (indicate cue type and reason): at sink                           General ADL Comments: Patient dressed upon entry and performed grooming and UB bathing standing at sink    Extremity/Trunk Assessment              Vision       Perception     Praxis      Cognition Arousal/Alertness: Awake/alert Behavior During Therapy: WFL for tasks assessed/performed Overall Cognitive Status: Within Functional Limits for tasks assessed                                          Exercises Exercises: General Upper Extremity General Exercises - Upper Extremity Shoulder Flexion: Strengthening, Both, 10 reps, Seated, Theraband Theraband Level (Shoulder Flexion): Level 1 (Yellow) Shoulder ADduction: Strengthening, Both, 10 reps, Seated, Theraband Theraband Level (Shoulder Adduction): Level 1 (Yellow) Elbow Flexion: Strengthening, Both, 10 reps, Seated, Theraband Theraband Level (Elbow Flexion): Level 1 (Yellow) Elbow Extension: Strengthening, Both,  10 reps, Seated, Theraband Theraband Level (Elbow Extension): Level 1 (Yellow)    Shoulder Instructions       General Comments      Pertinent Vitals/ Pain       Pain Assessment Pain Assessment: No/denies pain Pain Intervention(s): Monitored during session  Home Living                                          Prior Functioning/Environment              Frequency  Min 2X/week        Progress Toward Goals  OT Goals(current goals can now be found in the care plan section)  Progress towards OT goals: Progressing toward goals  Acute Rehab OT Goals Patient Stated Goal: get better OT Goal Formulation: With  patient Time For Goal Achievement: 03/05/22 Potential to Achieve Goals: Good ADL Goals Pt Will Perform Grooming: with modified independence;standing Pt Will Perform Lower Body Dressing: with modified independence;sit to/from stand Pt Will Transfer to Toilet: Independently;ambulating Pt Will Perform Toileting - Clothing Manipulation and hygiene: with modified independence;sit to/from stand  Plan Discharge plan remains appropriate    Co-evaluation                 AM-PAC OT "6 Clicks" Daily Activity     Outcome Measure   Help from another person eating meals?: None Help from another person taking care of personal grooming?: None Help from another person toileting, which includes using toliet, bedpan, or urinal?: A Little Help from another person bathing (including washing, rinsing, drying)?: A Little Help from another person to put on and taking off regular upper body clothing?: None Help from another person to put on and taking off regular lower body clothing?: None 6 Click Score: 22    End of Session    OT Visit Diagnosis: Unsteadiness on feet (R26.81);Muscle weakness (generalized) (M62.81);Pain   Activity Tolerance Patient tolerated treatment well   Patient Left in bed;with call bell/phone within reach (seated on EOB)   Nurse Communication Mobility status        Time: 3825-0539 OT Time Calculation (min): 27 min  Charges: OT General Charges $OT Visit: 1 Visit OT Treatments $Self Care/Home Management : 8-22 mins $Therapeutic Exercise: 8-22 mins  Lodema Hong, [place]  Office [phone]   Trixie Dredge 02/24/2022, 10:07 AM

## 2022-02-24 NOTE — Evaluation (Signed)
Physical Therapy Evaluation Patient Details Name: KEILEY LEVEY [name]: [id number] DOB: 1950-08-01 Today's Date: 02/24/2022  History of Present Illness  Pt is 71 yo female who presented for SOB. PMH includes: chronic HFpEF (last EF 50-55%) with mitral valve replacement on chronic coumadin, COPD, CAD, history of pulmonary embolism.  Clinical Impression  Pt admitted with above diagnosis. Pt progressing with mobility, ambulated increased distance at increased pace as well as ascending and descending 3 steps with use of rail and min A. Pt ambulated on RA with SpO2 dropping as low as 85% after stairs with recovery to mid 90's in 2 mins of seated rest. HR remained 105-110 bpm at rest and with mobility.  Pt currently with functional limitations due to the deficits listed below (see PT Problem List). Pt will benefit from skilled PT to increase their independence and safety with mobility to allow discharge to the venue listed below.          Recommendations for follow up therapy are one component of a multi-disciplinary discharge planning process, led by the attending physician.  Recommendations may be updated based on patient status, additional functional criteria and insurance authorization.  Follow Up Recommendations No PT follow up      Assistance Recommended at Discharge PRN  Patient can return home with the following  A little help with bathing/dressing/bathroom;Assistance with cooking/housework;Help with stairs or ramp for entrance    Equipment Recommendations None recommended by PT  Recommendations for Other Services       Functional Status Assessment       Precautions / Restrictions Precautions Precautions: Fall Restrictions Weight Bearing Restrictions: No      Mobility  Bed Mobility Overal bed mobility: Modified Independent Bed Mobility: Supine to Sit     Supine to sit: Modified independent (Device/Increase time)     General bed mobility comments: no difficulty coming to  EOB    Transfers Overall transfer level: Modified independent Equipment used: None Transfers: Sit to/from Stand Sit to Stand: Modified independent (Device/Increase time)           General transfer comment: pt maintains trunk flexion in standing due to sciatica, worked on gentle extension before ambulating, though pt cannot maintain erect posture wants she begins to ambulate. Does better with HHA, uses a cane at home    Ambulation/Gait Ambulation/Gait assistance: Supervision, Min assist Gait Distance (Feet): 300 Feet (1 seated rest) Assistive device: None, 1 person hand held assist Gait Pattern/deviations: Step-through pattern, Trunk flexed Gait velocity: decreased Gait velocity interpretation: >2.62 ft/sec, indicative of community ambulatory   General Gait Details: pt began ambulation without support but able to stand straighter with HHA so given to simulat cane use after first 75'. SpO2 dropped to 87% with ambulation, quickly returns to mid 90's with 2 min rest. HR remained 105-110 bpm at rest and wth ambulation  Stairs Stairs: Yes Stairs assistance: Min assist Stair Management: One rail Right, Alternating pattern, Forwards, Sideways Number of Stairs: 3 General stair comments: alternating pattern on ascent, sideways with both hands on rail for descent due to LE weakness. SPO2 dropped to 85%, recovered to mid 90's with 2 mins seated rest  Wheelchair Mobility    Modified Rankin (Stroke Patients Only)       Balance Overall balance assessment: No apparent balance deficits (not formally assessed)  Pertinent Vitals/Pain Pain Assessment Pain Assessment: No/denies pain    Home Living                          Prior Function                       Hand Dominance        Extremity/Trunk Assessment                Communication      Cognition Arousal/Alertness: Awake/alert Behavior  During Therapy: WFL for tasks assessed/performed Overall Cognitive Status: Within Functional Limits for tasks assessed                                          General Comments General comments (skin integrity, edema, etc.): discussed self monitoring of HR and SPO2 at home with pulse ox as well as activity level upon return to home to regain PLOF    Exercises     Assessment/Plan    PT Assessment    PT Problem List         PT Treatment Interventions      PT Goals (Current goals can be found in the Care Plan section)  Acute Rehab PT Goals Patient Stated Goal: to return home PT Goal Formulation: With patient Time For Goal Achievement: 03/05/22 Potential to Achieve Goals: Good    Frequency Min 3X/week     Co-evaluation               AM-PAC PT "6 Clicks" Mobility  Outcome Measure Help needed turning from your back to your side while in a flat bed without using bedrails?: None Help needed moving from lying on your back to sitting on the side of a flat bed without using bedrails?: None Help needed moving to and from a bed to a chair (including a wheelchair)?: None Help needed standing up from a chair using your arms (e.g., wheelchair or bedside chair)?: None Help needed to walk in hospital room?: A Little Help needed climbing 3-5 steps with a railing? : A Little 6 Click Score: 22    End of Session Equipment Utilized During Treatment: Gait belt Activity Tolerance: Patient tolerated treatment well Patient left: with call bell/phone within reach;in chair Nurse Communication: Mobility status PT Visit Diagnosis: Other abnormalities of gait and mobility (R26.89)    Time: 5009-3818 PT Time Calculation (min) (ACUTE ONLY): 18 min   Charges:     PT Treatments $Gait Training: 8-22 mins        Leighton Roach, PT  Acute Rehab Services Secure chat preferred Office [place] 02/24/2022, 10:09 AM

## 2022-02-24 NOTE — TOC Progression Note (Signed)
Transition of Care [facility]) - Progression Note    Patient Details  Name: Kim Anthony [name]: [id number] Date of Birth: 1950-12-03  Transition of Care [facility]) CM/SW Contact  Zenon Mayo, RN Phone Number: 02/24/2022, 12:36 PM  Clinical Narrative:    Patient is for cardioversion tomorrow.  INR elevated.  Conts on amio po, TOC following.    Expected Discharge Plan: Home/Self Care Barriers to Discharge: Continued Medical Work up  Expected Discharge Plan and Services Expected Discharge Plan: Home/Self Care In-house Referral: NA Discharge Planning Services: CM Consult Post Acute Care Choice: NA Living arrangements for the past 2 months: Single Family Home                   DME Agency: NA       HH Arranged: NA           Social Determinants of Health (SDOH) Interventions Food Insecurity Interventions: Inpatient TOC, Other (Comment) (gave the little green book and little blue book for free food pantries and resources) Housing Interventions: Inpatient TOC, Intervention Not Indicated  Readmission Risk Interventions    02/19/2022    3:36 PM  Readmission Risk Prevention Plan  Transportation Screening Complete  PCP or Specialist Appt within 3-5 Days Complete  HRI or Home Care Consult Complete  Social Work Consult for [place] Planning/Counseling Complete  Palliative Care Screening Not Applicable  Medication Review [employer]) Complete

## 2022-02-24 NOTE — Progress Notes (Addendum)
   02/24/22 1532  Mobility  Activity Ambulated independently in hallway  Level of Assistance Independent  Assistive Device None  Distance Ambulated (ft) 240 ft  Activity Response Tolerated well  $Mobility charge 1 Mobility   Mobility Specialist Progress Note  Received pt in bed having no complaints and agreeable to mobility. Pt was asymptomatic throughout ambulation and returned to room w/o fault. Left in bed w/ call bell in reach and all needs met.   Lucious Groves Mobility Specialist

## 2022-02-24 NOTE — Progress Notes (Signed)
Subjective:  71 y.o. female living with a PMHx of CHF post MV replacement, COPD, PEs, and hypertension who presented with shortness of breath and admitted for acute decompensated heart failure exacerbation.  Overnight events: None  Pt is in NAD. Denies SOB, CP, LE swelling. Discussed plans for tomorrows TEE/DCCV which she was agreeable to. No signs of continued delirium. No other complaints.   Objective:  Vital signs in last 24 hours: Vitals:   02/24/22 0550 02/24/22 0807 02/24/22 0853 02/24/22 1043  BP:  101/84  (!) 140/110  Pulse:  (!) 107  (!) 106  Resp:    20  Temp:  97.8 F (36.6 C)  97.6 F (36.4 C)  TempSrc:  Oral  Oral  SpO2:  96% 95% 97%  Weight: 75.8 kg     Height:       Weight change: -0.998 kg  Intake/Output Summary (Last 24 hours) at 02/24/2022 1432 Last data filed at 02/24/2022 1310 Gross per 24 hour  Intake 834 ml  Output --  Net 834 ml   Constitutional: alert, sitting in bed, in no acute distress  HENT: normocephalic atraumatic   Cardiovascular: regular rate and rhythm. Pulmonary: normal work of breathing,  clear to auscultation bilaterally MSK: normal bulk and tone Neurological: alert & oriented x 3 Skin: warm and dry, no LE edema  Psych: normal mood and behavior   Assessment/Plan:  Principal Problem:   Combined systolic and diastolic congestive heart failure (HCC) Active Problems:   Pulmonary embolism (HCC)   Chronic low back pain   Tobacco use disorder   COPD (chronic obstructive pulmonary disease) (HCC)   S/P MVR (mitral valve replacement)   Osteoarthritis of left knee   Combined systolic and diastolic heart failure (HCC)   CHF (congestive heart failure) (HCC)   Atypical atrial flutter (HCC)  # Acute on chronic HFrEF exacerbation LVEF 35-40% # Biventricular failure Cardiology believes volume overload may be secondary to atypical atrial flutter. O2 sat at 95%+ on room air. Weight has decreased to 75.8 kg.  > oral torsemide '20mg'$  > PO  amiodarone 400 mg BID with plans for TEE/DCCV 9/6 per cardio recommendation > Continue hydralazine and Imdur > Toprol XL  > Follow up in HF clinic   Atypical atrial flutter Cardiology suspecting that patient has been in atypical atrial flutter given minimal heart rate variability on telemetry likely related to MVR. Pt receiving PO amiodarone. Telemetry was taken off yesterday due to patient frustration and inability to tolerate. This will need to be placed back on s/p TEE/DCCV tomorrow >continue amiodarone > cardiology recommending inpatient TEE/DCCV prior to discharge with likely date 9/6  Hx of PE Hx of MV replacement Goal INR 2.5-3.5 given mechanical MV. Follows Dr. Elie Confer. Pt currently on Warfarin per pharmacy. Cardiology arranging for TEE as she was subtherapeutic on admission. INR noted to be 4.7 today. Suspect warfarin metabolism impaired due to amiodarone. Warfarin being held. Per Cardiology, TEE/DCCV performing physician INR cutoff is <5.  > continue to hold warfarin >recheck PT/INR in the AM > administer vitamin K if INR>5 on recheck   AKI on CKD IIIA Improved with diuresis. May see additional improvement s/p cardioverson > strict I+Os > daily BMP  COPD On Stiolto respimat, albuterol, and montelukast at home. Does not follow with pulmonologist. No respiratory distress or wheezing on exam. > Continue albuterol and montelukast > Brovana nebulizer and incruse ellipta > Possible pulmonology referral as outpatient  Chronic low back pain No complaints of back pain today.  Self-reported history of radiculopathy and sciatic nerve pain. Prescribed Norco 5-'325mg'$  q4h PRN at home. States she has been doubling up on dose for adequate control prior to admission.  > continue Norco 10-650 mg Q4 PRN  Tobacco use disorder Smokes 6 cigarettes a day for the last 30 years >continue nicotine patch  # Delirium Self-resolved episode of delirium 2 nights ago.  > delirium precautions >  loosened restrictions. Pt up out of bed as able, and tele discontinued. Bed alarm off   Diet: Heart Healthy, Carb-modified VTE: Coumadin IVF: None,None Code: Full Prior to Admission Living Arrangement: Home, living with daughter Anticipated Discharge Location: Home Barriers to Discharge: CHF exacerbation Dispo: Admit patient to inpatient     LOS: 5 days   Essie Hart, MS3 02/24/2022, 2:32 PM   Attestation for Student Documentation: I personally was present and performed or re-performed the history, physical exam and medical decision-making activities of this service and have verified that the service and findings are accurately documented in the student's note. Delene Ruffini, MD 02/24/2022, 2:40 PM

## 2022-02-24 NOTE — Progress Notes (Signed)
ANTICOAGULATION CONSULT NOTE - Follow Up  Pharmacy Consult for warfarin Indication: mechanical mitral valve   Allergies  Allergen Reactions   Lisinopril Swelling   Acetaminophen-Codeine Itching   Propoxyphene Itching    Darvocet   Tape Rash    Plastic   Tramadol Itching, Nausea And Vomiting and Rash    Patient Measurements: Height: '5\' 5"'$  (165.1 cm) Weight: 75.8 kg (167 lb) IBW/kg (Calculated) : 57   Vital Signs: Temp: 97.6 F (36.4 C) (09/05 1043) Temp Source: Oral (09/05 1043) BP: 140/110 (09/05 1043) Pulse Rate: 106 (09/05 1043)  Labs: Recent Labs    02/22/22 0234 02/23/22 0437 02/23/22 0711 02/24/22 0246  LABPROT 36.0* 40.0*  --  44.1*  INR 3.7* 4.2*  --  4.7*  CREATININE 1.07*  --  1.18* 1.13*     Estimated Creatinine Clearance: 45.8 mL/min (A) (by C-G formula based on SCr of 1.13 mg/dL (H)).   Medical History: Past Medical History:  Diagnosis Date   Arthritis    Asthma    Breast cancer ([employer]) 2001   Left Breast Cancer   CHF (congestive heart failure) (HCC)    COPD (chronic obstructive pulmonary disease) (HCC)    Coronary artery disease    GERD (gastroesophageal reflux disease)    History of mitral valve replacement with mechanical valve    HLD (hyperlipidemia)    Hypertension    Pulmonary embolism ([place]) 2021    Medications:  No current facility-administered medications on file prior to encounter.   Current Outpatient Medications on File Prior to Encounter  Medication Sig Dispense Refill   albuterol (PROVENTIL) (2.5 MG/3ML) 0.083% nebulizer solution USE 1 VIAL IN NEBULIZER EVERY 6 HOURS AS NEEDED FOR WHEEZING FOR SHORTNESS OF BREATH (Patient taking differently: Take 2.5 mg by nebulization every 6 (six) hours as needed for shortness of breath.) 90 mL 2   albuterol (VENTOLIN HFA) 108 (90 Base) MCG/ACT inhaler Inhale 2 puffs into the lungs every 6 (six) hours as needed for wheezing or shortness of breath. 8 g 2   diphenhydrAMINE (BENADRYL) 25 MG  tablet Take 75 mg by mouth daily as needed for allergies.     docusate sodium (COLACE) 100 MG capsule Take 1 capsule (100 mg total) by mouth 2 (two) times daily as needed for mild constipation. (Patient taking differently: Take 100 mg by mouth daily as needed for mild constipation.) 30 capsule 1   FARXIGA 10 MG TABS tablet Take 1 tablet by mouth once daily 90 tablet 0   fluticasone (FLONASE) 50 MCG/ACT nasal spray Place 1 spray into both nostrils daily as needed for allergies. 9.9 mL 2   furosemide (LASIX) 40 MG tablet Take 1 tablet (40 mg total) by mouth 2 (two) times daily for 5 days. Then return to your normal dose of 1 tablet daily (Patient taking differently: Take 40 mg by mouth daily.) 45 tablet 3   gabapentin (NEURONTIN) 600 MG tablet TAKE 1 & 1/2 (ONE & ONE-HALF) TABLETS BY MOUTH THREE TIMES DAILY FOR  NERVE  PAIN (Patient taking differently: Take 900 mg by mouth 3 (three) times daily. Take 1 and 1/2 tablets by mouth three times a day for nerve pain) 150 tablet 5   hydrALAZINE (APRESOLINE) 25 MG tablet Take 1 tablet (25 mg total) by mouth 3 (three) times daily. 90 tablet 11   HYDROcodone-acetaminophen (NORCO) 5-325 MG tablet Take 1 tablet by mouth every 4 (four) hours as needed for moderate pain. 150 tablet 0   isosorbide mononitrate (IMDUR) 30 MG  24 hr tablet Take 1 tablet (30 mg total) by mouth daily. 90 tablet 2   loratadine (CLARITIN) 10 MG tablet Take 1 tablet (10 mg total) by mouth daily. 90 tablet 0   metoprolol succinate (TOPROL-XL) 25 MG 24 hr tablet Take 1 tablet (25 mg total) by mouth daily. 90 tablet 2   montelukast (SINGULAIR) 10 MG tablet Take 1 tablet (10 mg total) by mouth at bedtime. 90 tablet 2   polyethylene glycol (MIRALAX / GLYCOLAX) 17 g packet Take 17 g by mouth daily. (Patient taking differently: Take 17 g by mouth daily as needed for mild constipation.) 14 each 0   Simethicone (GAS-X PO) Take 1 tablet by mouth daily.     simvastatin (ZOCOR) 20 MG tablet Take 1 tablet  (20 mg total) by mouth daily. 90 tablet 2   spironolactone (ALDACTONE) 25 MG tablet Take 1 tablet by mouth once daily (Patient taking differently: Take 25 mg by mouth daily.) 90 tablet 3   Tiotropium Bromide-Olodaterol (STIOLTO RESPIMAT) 2.5-2.5 MCG/ACT AERS INHALE 2 PUFFS BY MOUTH ONCE DAILY (Patient taking differently: Inhale 2 puffs by mouth daily) 4 g 0   warfarin (COUMADIN) 5 MG tablet TAKE 1& 1/2  TABLETS BY MOUTH ONCE DAILY AT 4 PM. (Patient taking differently: Take 5 mg by mouth at bedtime.) 42 tablet 1   famotidine (PEPCID) 20 MG tablet Take 1 tablet by mouth twice daily (Patient not taking: Reported on 02/18/2022) 60 tablet 0   nicotine (NICODERM CQ - DOSED IN MG/24 HOURS) 21 mg/24hr patch Place 1 patch (21 mg total) onto the skin daily. (Patient not taking: Reported on 02/18/2022) 30 patch 2   potassium chloride SA (KLOR-CON M) 20 MEQ tablet Take 2 tablets (40 mEq total) by mouth daily. (Patient taking differently: Take 20 mEq by mouth daily.) 30 tablet 6   Spacer/Aero-Holding Chambers (BREATHERITE COLL SPACER ADULT) MISC 1 applicator by Does not apply route daily. 1 each 0     Assessment: 71 y.o. female with h/o mechanical MVR and PE , on  Coumadin.   Goal INR is 2.5-3.5. INR on admission was slightly subtherapeutic at 2.2 on 02/18/22.  Noted that pt has had multiple recent dose adjustments to Coumadin regimen prior to admission due to previous supra-therapeutic INR results- most recent PTA regimen at 5 mg daily.  INR increased and remains SUPRA-therapeutic at 4.7 today. Hgb 12.5, plt 317 on last check 9/2. Ordered CBC for tomorrow.  Oral intake documented at 100% on 9/3, but only ~30% on 9/4. Breakfast today documented as 100%.  Drug interactions: Amiodarone started on 9/1.    Goal of Therapy:  INR 2.5-3.5 Monitor platelets by anticoagulation protocol: Yes   Plan:  Hold warfarin Monitor daily INR, CBC, clinical course, s/sx of bleed, PO intake/diet, Drug-Drug  Interactions   Thank you for allowing [place] to participate in this patients care. Jens Som, PharmD 02/24/2022 11:22 AM  **Pharmacist phone directory can be found on [email or web address] listed under [place]**

## 2022-02-25 ENCOUNTER — Inpatient Hospital Stay (HOSPITAL_COMMUNITY): Payer: Medicare Other

## 2022-02-25 ENCOUNTER — Inpatient Hospital Stay (HOSPITAL_COMMUNITY): Payer: Medicare Other | Admitting: Certified Registered"

## 2022-02-25 ENCOUNTER — Encounter (HOSPITAL_COMMUNITY): Payer: Self-pay | Admitting: Internal Medicine

## 2022-02-25 ENCOUNTER — Encounter (HOSPITAL_COMMUNITY)
Admission: EM | Disposition: A | Discharge status: Home / Self Care | Payer: Self-pay | Source: Home / Self Care | Attending: Internal Medicine

## 2022-02-25 ENCOUNTER — Inpatient Hospital Stay (HOSPITAL_COMMUNITY)
Admit: 2022-02-25 | Discharge: 2022-02-25 | Disposition: A | Discharge status: Home / Self Care | Payer: Medicare Other | Attending: Internal Medicine | Admitting: Internal Medicine

## 2022-02-25 ENCOUNTER — Encounter: Payer: Self-pay | Admitting: *Deleted

## 2022-02-25 DIAGNOSIS — J449 Chronic obstructive pulmonary disease, unspecified: Secondary | ICD-10-CM

## 2022-02-25 DIAGNOSIS — I110 Hypertensive heart disease with heart failure: Secondary | ICD-10-CM | POA: Diagnosis not present

## 2022-02-25 DIAGNOSIS — Z952 Presence of prosthetic heart valve: Secondary | ICD-10-CM | POA: Diagnosis not present

## 2022-02-25 DIAGNOSIS — Z20822 Contact with and (suspected) exposure to covid-19: Secondary | ICD-10-CM | POA: Diagnosis not present

## 2022-02-25 DIAGNOSIS — I5043 Acute on chronic combined systolic (congestive) and diastolic (congestive) heart failure: Secondary | ICD-10-CM | POA: Diagnosis not present

## 2022-02-25 DIAGNOSIS — I4892 Unspecified atrial flutter: Secondary | ICD-10-CM

## 2022-02-25 DIAGNOSIS — N179 Acute kidney failure, unspecified: Secondary | ICD-10-CM | POA: Diagnosis not present

## 2022-02-25 DIAGNOSIS — I5040 Unspecified combined systolic (congestive) and diastolic (congestive) heart failure: Secondary | ICD-10-CM

## 2022-02-25 DIAGNOSIS — I2510 Atherosclerotic heart disease of native coronary artery without angina pectoris: Secondary | ICD-10-CM

## 2022-02-25 DIAGNOSIS — F17210 Nicotine dependence, cigarettes, uncomplicated: Secondary | ICD-10-CM

## 2022-02-25 DIAGNOSIS — I5082 Biventricular heart failure: Secondary | ICD-10-CM | POA: Diagnosis not present

## 2022-02-25 DIAGNOSIS — I484 Atypical atrial flutter: Secondary | ICD-10-CM | POA: Diagnosis not present

## 2022-02-25 DIAGNOSIS — I130 Hypertensive heart and chronic kidney disease with heart failure and stage 1 through stage 4 chronic kidney disease, or unspecified chronic kidney disease: Secondary | ICD-10-CM | POA: Diagnosis not present

## 2022-02-25 LAB — BASIC METABOLIC PANEL
Anion gap: 13 (ref 5–15)
BUN: 21 mg/dL (ref 8–23)
CO2: 23 mmol/L (ref 22–32)
Calcium: 9.5 mg/dL (ref 8.9–10.3)
Chloride: 103 mmol/L (ref 98–111)
Creatinine, Ser: 1.11 mg/dL — ABNORMAL HIGH (ref 0.44–1.00)
GFR, Estimated: 53 mL/min — ABNORMAL LOW (ref >60)
Glucose, Bld: 95 mg/dL (ref 70–99)
Potassium: 3.4 mmol/L — ABNORMAL LOW (ref 3.5–5.1)
Sodium: 139 mmol/L (ref 135–145)

## 2022-02-25 LAB — CBC
HCT: 42.5 % (ref 36.0–46.0)
Hemoglobin: 13.1 g/dL (ref 12.0–15.0)
MCH: 29.3 pg (ref 26.0–34.0)
MCHC: 30.8 g/dL (ref 30.0–36.0)
MCV: 95.1 fL (ref 80.0–100.0)
Platelets: 359 10*3/uL (ref 150–400)
RBC: 4.47 MIL/uL (ref 3.87–5.11)
RDW: 14 % (ref 11.5–15.5)
WBC: 7.7 10*3/uL (ref 4.0–10.5)
nRBC: 0 % (ref 0.0–0.2)

## 2022-02-25 LAB — PROTIME-INR
INR: 3.9 — ABNORMAL HIGH (ref 0.8–1.2)
INR: 3.6 — ABNORMAL HIGH (ref 0.8–1.2)
Prothrombin Time: 38.1 seconds — ABNORMAL HIGH (ref 11.4–15.2)
Prothrombin Time: 35.8 seconds — ABNORMAL HIGH (ref 11.4–15.2)

## 2022-02-25 LAB — MAGNESIUM: Magnesium: 1.9 mg/dL (ref 1.7–2.4)

## 2022-02-25 SURGERY — INVASIVE LAB ABORTED CASE
Anesthesia: Monitor Anesthesia Care

## 2022-02-25 MED ORDER — SODIUM CHLORIDE 0.9 % IV SOLN: INTRAVENOUS | Status: DC

## 2022-02-25 MED ORDER — WARFARIN 0.5 MG HALF TABLET
0.5000 mg | ORAL_TABLET | Freq: Once | ORAL | Status: AC
  Administered 2022-02-25: 0.5 mg via ORAL
  Filled 2022-02-25: qty 1

## 2022-02-25 MED ORDER — ALBUTEROL SULFATE (2.5 MG/3ML) 0.083% IN NEBU
INHALATION_SOLUTION | RESPIRATORY_TRACT | Status: AC
  Filled 2022-02-25: qty 3

## 2022-02-25 MED ORDER — HYDROCODONE-ACETAMINOPHEN 5-325 MG PO TABS
1.0000 | ORAL_TABLET | ORAL | 0 refills | Status: DC | PRN
Start: 2022-02-25 — End: 2022-03-16

## 2022-02-25 MED ORDER — PROPOFOL 500 MG/50ML IV EMUL
INTRAVENOUS | Status: DC | PRN
  Administered 2022-02-25: 100 ug/kg/min via INTRAVENOUS

## 2022-02-25 MED ORDER — PHENYLEPHRINE 80 MCG/ML (10ML) SYRINGE FOR IV PUSH (FOR BLOOD PRESSURE SUPPORT)
PREFILLED_SYRINGE | INTRAVENOUS | Status: DC | PRN
  Administered 2022-02-25 (×2): 160 ug via INTRAVENOUS

## 2022-02-25 MED ORDER — PROPOFOL 10 MG/ML IV BOLUS
INTRAVENOUS | Status: DC | PRN
  Administered 2022-02-25: 10 mg via INTRAVENOUS
  Administered 2022-02-25: 20 mg via INTRAVENOUS
  Administered 2022-02-25: 30 mg via INTRAVENOUS
  Administered 2022-02-25: 20 mg via INTRAVENOUS

## 2022-02-25 MED ORDER — FENTANYL CITRATE PF 50 MCG/ML IJ SOSY
25.0000 ug | PREFILLED_SYRINGE | Freq: Once | INTRAMUSCULAR | Status: DC
  Filled 2022-02-25: qty 1

## 2022-02-25 MED ORDER — ALBUTEROL SULFATE (2.5 MG/3ML) 0.083% IN NEBU
2.5000 mg | INHALATION_SOLUTION | Freq: Once | RESPIRATORY_TRACT | Status: AC
  Administered 2022-02-25: 2.5 mg via RESPIRATORY_TRACT

## 2022-02-25 MED ORDER — FENTANYL CITRATE PF 50 MCG/ML IJ SOSY: 25.0000 ug | PREFILLED_SYRINGE | Freq: Once | INTRAMUSCULAR | Status: DC | PRN

## 2022-02-25 MED ORDER — TORSEMIDE 20 MG PO TABS: 10.0000 mg | ORAL_TABLET | Freq: Every day | ORAL | Status: DC

## 2022-02-25 NOTE — Anesthesia Procedure Notes (Signed)
Procedure Name: MAC Date/Time: 02/25/2022 12:10 PM  Performed by: Anastasio Auerbach, CRNAPre-anesthesia Checklist: Emergency Drugs available, Suction available and Patient being monitored Oxygen Delivery Method: Nasal cannula Induction Type: IV induction Airway Equipment and Method: Bite block

## 2022-02-25 NOTE — Progress Notes (Signed)
TEE aborted minutes after scope was in due to patient having laryngospasm. Pt is sent to PACU for recovery. Pt is to have a cardioversion tomorrow.

## 2022-02-25 NOTE — Progress Notes (Addendum)
Subjective:  71 y.o. female living with a PMHx of CHF post MV replacement, COPD, PEs, and hypertension who presented with shortness of breath and admitted for acute decompensated heart failure exacerbation.  Overnight Events: None  Pt in NAD sitting up comfortably in bed. Denies dyspnea or chest pain. Was still aware and agreeable to the procedure this afternoon. No other complaints.   Objective:  Vital signs in last 24 hours: Vitals:   02/25/22 1144 02/25/22 1240 02/25/22 1255 02/25/22 1310  BP: 107/77 96/61 105/75 117/81  Pulse: (!) 105 (!) 104 (!) 105 (!) 104  Resp: 18 20 (!) 24 (!) 22  Temp: (!) 97.5 F (36.4 C) 97.7 F (36.5 C)    TempSrc: Temporal     SpO2: 93% 99% 95% 98%  Weight:      Height:       Weight change:   Intake/Output Summary (Last 24 hours) at 02/25/2022 1321 Last data filed at 02/25/2022 1234 Gross per 24 hour  Intake 440 ml  Output --  Net 440 ml   Constitutional: alert, sitting in bed, in no acute distress  HENT: normocephalic atraumatic   Cardiovascular: tachycardic rate, regular rhythm. Pulmonary: normal work of breathing,  clear to auscultation bilaterally MSK: normal bulk and tone Neurological: alert & oriented x 3 Skin: warm and dry, no LE edema  Psych: normal mood and behavior   Assessment/Plan:  Principal Problem:   Combined systolic and diastolic congestive heart failure (HCC) Active Problems:   Pulmonary embolism (HCC)   Chronic low back pain   Tobacco use disorder   COPD (chronic obstructive pulmonary disease) (HCC)   S/P MVR (mitral valve replacement)   Osteoarthritis of left knee   Combined systolic and diastolic heart failure (HCC)   CHF (congestive heart failure) (HCC)   Atypical atrial flutter (HCC)  # Acute on chronic HFrEF exacerbation LVEF 35-40% # Biventricular failure Per cardiology, volume overload secondary to atypical atrial flutter.    TEE today was aborted  secondary to patient having laryngospasm. Required  brief airway management in PACU. CXR showed no acute changes. New O2 requirement unlikely volume overload, likely lingering effects of laryngospasm. May see improvement in LVEF with cardioversion. > hold oral torsemide and resume 10 mg daily tomorrow > Continue hydralazine and Imdur > Toprol XL  - wean O2 as tolerated. > Follow up in HF clinic   Atypical atrial flutter Cardiology suspecting that patient has been in atypical atrial flutter given minimal heart rate variability on telemetry likely related to MVR. Pt receiving PO amiodarone.  Underwent TEE today, but this had to be aborted due to laryngospasm. No thrombus noted in atrial appendage. Cardioversion rescheduled for tomorrow. She remains in flutter > continue telemetry > PO amiodarone 400 mg BID > DCCV tomorrow - NPO at [facility]  Hx of PE Hx of MV replacement Goal INR 2.5-3.5 given mechanical MV. Follows Dr. Elie Confer. Pt currently on Warfarin per pharmacy. INR noted to be 3.9 today after Warfarin hold.  > continue to hold warfarin >recheck PT/INR in the AM  > will plan to resume warfarin after discharge with close follow up with Dr. Elie Confer  AKI on CKD IIIA Elevation in BUN and Cr suggestive of prerenal etiology, likely due to overdiuresis with torsemide. May see additional improvement s/p cardioversion - Will hold oral torsemide today and decrease dosing from 20 mg to 10 mg tomorrow. > strict I+Os > daily BMP  COPD On Stiolto respimat, albuterol, and montelukast at home. Does not  follow with pulmonologist. No respiratory distress or wheezing on exam. > Continue albuterol and montelukast > Brovana nebulizer and incruse ellipta > Possible pulmonology referral as outpatient  Chronic low back pain No complaints of back pain today. Self-reported history of radiculopathy and sciatic nerve pain. Prescribed Norco 5-'325mg'$  q4h PRN at home. States she has been doubling up on dose for adequate control prior to admission.  > continue Norco  10-650 mg Q4 PRN  Tobacco use disorder Smokes 6 cigarettes a day for the last 30 years >continue nicotine patch  # Delirium Self-resolved episode of delirium 3 nights ago.  > delirium precautions    LOS: 6 days   Kim Anthony I, Medical Student 02/25/2022, 1:21 PM  Attestation for Student Documentation: I personally was present and performed or re-performed the history, physical exam and medical decision-making activities of this service and have verified that the service and findings are accurately documented in the student's note. Delene Ruffini, MD 02/25/2022, 3:32 PM

## 2022-02-25 NOTE — Progress Notes (Signed)
  Echocardiogram Echocardiogram Transesophageal has been performed.  Kim Anthony 02/25/2022, 12:37 PM

## 2022-02-25 NOTE — Transfer of Care (Signed)
Immediate Anesthesia Transfer of Care Note  Patient: Kim Anthony  Procedure(s) Performed: TRANSESOPHAGEAL ECHOCARDIOGRAM (TEE) CARDIOVERSION  Patient Location: PACU  Anesthesia Type:MAC  Level of Consciousness: awake  Airway & Oxygen Therapy: Patient Spontanous Breathing and Patient connected to nasal cannula oxygen  Post-op Assessment: Report given to RN and Post -op Vital signs reviewed and stable  Post vital signs: Reviewed and stable  Last Vitals:  Vitals Value Taken Time  BP 96/61 02/25/22 1240  Temp    Pulse 103 02/25/22 1241  Resp 32 02/25/22 1241  SpO2 99 % 02/25/22 1241  Vitals shown include unvalidated device data.  Last Pain:  Vitals:   02/25/22 1144  TempSrc: Temporal  PainSc: 0-No pain         Complications: No notable events documented.

## 2022-02-25 NOTE — Anesthesia Postprocedure Evaluation (Signed)
Anesthesia Post Note  Patient: Kim Anthony  Procedure(s) Performed: ABORTED TEE      Patient location during evaluation: PACU Level of consciousness: awake Pain management: pain level controlled Cardiovascular status: stable Postop Assessment: no apparent nausea or vomiting Anesthetic complications: no   No notable events documented.  Last Vitals:  Vitals:   02/25/22 1310 02/25/22 1325  BP: 117/81 121/73  Pulse: (!) 104 (!) 104  Resp: (!) 22 (!) 22  Temp:  36.5 C  SpO2: 98% 97%    Last Pain:  Vitals:   02/25/22 1325  TempSrc:   PainSc: 0-No pain                 Shalom Mcguiness

## 2022-02-25 NOTE — Progress Notes (Addendum)
ANTICOAGULATION CONSULT NOTE - Follow Up Consult  Pharmacy Consult for Warfarin Indication:  mechanical mitral valve  Allergies  Allergen Reactions   Lisinopril Swelling   Acetaminophen-Codeine Itching   Propoxyphene Itching    Darvocet   Tape Rash    Plastic   Tramadol Itching, Nausea And Vomiting and Rash    Patient Measurements: Height: '5\' 5"'$  (165.1 cm) Weight: 75.8 kg (167 lb) IBW/kg (Calculated) : 57  Vital Signs: Temp: 97.7 F (36.5 C) (09/06 1325) Temp Source: Temporal (09/06 1144) BP: 121/73 (09/06 1325) Pulse Rate: 104 (09/06 1325)  Labs: Recent Labs    02/23/22 0437 02/23/22 0711 02/24/22 0246 02/24/22 1216 02/25/22 0726  HGB  --   --   --  13.4 13.1  HCT  --   --   --  41.6 42.5  PLT  --   --   --  361 359  LABPROT 40.0*  --  44.1*  --  38.1*  INR 4.2*  --  4.7*  --  3.9*  CREATININE  --    < > 1.13* 1.23* 1.11*   < > = values in this interval not displayed.    Estimated Creatinine Clearance: 46.6 mL/min (A) (by C-G formula based on SCr of 1.11 mg/dL (H)).  Assessment: 71 y.o. female with h/o mechanical MVR and PE and on Warfarin. Goal INR is 2.5-3.5. INR on admission was slightly subtherapeutic at 2.2 on 02/18/22.  Noted that pt has had multiple recent dose adjustments to Coumadin regimen prior to admission due to previous supra-therapeutic INR results. Most recent PTA regimen is 5 mg daily.     INR trended up to 4.7 yesterday and remains SUPRA-therapeutic at 3.6 today. Warfarin has been held x 2 days. Expect further decrease in INR by am. CBC stable. No bleeding reported.    Amiodarone begun on 9/1, so may need lower warfarin doses that PTA.   Noted TEE-DCCV aborted today due to respiratory issues. No LAA thrombus noted. DCCV rescheduled for 9/7.   Goal of Therapy:  INR 2.5-3.5 Monitor platelets by anticoagulation protocol: Yes   Plan:  Warfarin 0.5 mg x 1 today, hoping to avoid INR trending down below goal. Daily PT/INR  Arty Baumgartner, RPh 02/25/2022,3:31 PM

## 2022-02-25 NOTE — Anesthesia Preprocedure Evaluation (Signed)
Anesthesia Evaluation  Patient identified by [name], date of birth, ID band Patient awake    Reviewed: Allergy & Precautions, NPO status , Patient's Chart, lab work & pertinent test results  Airway Mallampati: II  TM Distance: >3 FB     Dental   Pulmonary asthma , COPD, Current Smoker and Patient abstained from smoking.,    breath sounds clear to auscultation       Cardiovascular hypertension, + CAD and +CHF   Rhythm:Regular Rate:Normal     Neuro/Psych  Headaches, Seizures -,     GI/Hepatic Neg liver ROS, GERD  ,  Endo/Other  negative endocrine ROS  Renal/GU negative Renal ROS     Musculoskeletal   Abdominal   Peds  Hematology   Anesthesia Other Findings   Reproductive/Obstetrics                             Anesthesia Physical Anesthesia Plan  ASA: 3  Anesthesia Plan: MAC   Post-op Pain Management:    Induction: Intravenous  PONV Risk Score and Plan: 1 and Propofol infusion  Airway Management Planned: Nasal Cannula and Simple Face Mask  Additional Equipment:   Intra-op Plan:   Post-operative Plan:   Informed Consent: I have reviewed the patients History and Physical, chart, labs and discussed the procedure including the risks, benefits and alternatives for the proposed anesthesia with the patient or authorized representative who has indicated his/her understanding and acceptance.     Dental advisory given  Plan Discussed with: CRNA and Anesthesiologist  Anesthesia Plan Comments:         Anesthesia Quick Evaluation

## 2022-02-25 NOTE — Discharge Summary (Signed)
Name: Kim Anthony [name]: [id number] DOB: Sep 22, 1950 71 y.o. PCP: Iona Coach, MD  Date of Admission: 02/18/2022  2:01 AM Date of Discharge: 02/26/22 Attending Physician: Dr. Daryll Drown  Discharge Diagnosis: Principal Problem:   Combined systolic and diastolic congestive heart failure ([place]) Active Problems:   Pulmonary embolism (HCC)   Chronic low back pain   Tobacco use disorder   COPD (chronic obstructive pulmonary disease) (HCC)   S/P MVR (mitral valve replacement)   Osteoarthritis of left knee   Combined systolic and diastolic heart failure (HCC)   CHF (congestive heart failure) (HCC)   Atypical atrial flutter ([place])    Discharge Medications: Allergies as of 02/26/2022       Reactions   Lisinopril Swelling   Acetaminophen-codeine Itching   Propoxyphene Itching   Darvocet   Tape Rash   Plastic   Tramadol Itching, Nausea And Vomiting, Rash        Medication List     STOP taking these medications    furosemide 40 MG tablet Commonly known as: LASIX       TAKE these medications    albuterol (2.5 MG/3ML) 0.083% nebulizer solution Commonly known as: PROVENTIL USE 1 VIAL IN NEBULIZER EVERY 6 HOURS AS NEEDED FOR WHEEZING FOR SHORTNESS OF BREATH What changed:  how much to take how to take this when to take this reasons to take this additional instructions   albuterol 108 (90 Base) MCG/ACT inhaler Commonly known as: VENTOLIN HFA Inhale 2 puffs into the lungs every 6 (six) hours as needed for wheezing or shortness of breath. What changed: Another medication with the same name was changed. Make sure you understand how and when to take each.   amiodarone 400 MG tablet Commonly known as: PACERONE Take 1 tablet (400 mg total) by mouth 2 (two) times daily for 3 doses.   amiodarone 200 MG tablet Commonly known as: PACERONE Take 1 tablet (200 mg total) by mouth 2 (two) times daily for 7 days. Start taking on: February 28, 2022   amiodarone 200 MG tablet Commonly  known as: PACERONE Take 1 tablet (200 mg total) by mouth daily. Start taking on: March 07, 2022   BreatheRite Coll Spacer Adult Misc 1 applicator by Does not apply route daily.   diphenhydrAMINE 25 MG tablet Commonly known as: BENADRYL Take 75 mg by mouth daily as needed for allergies.   docusate sodium 100 MG capsule Commonly known as: Colace Take 1 capsule (100 mg total) by mouth 2 (two) times daily as needed for mild constipation. What changed: when to take this   famotidine 20 MG tablet Commonly known as: PEPCID Take 1 tablet by mouth twice daily   Farxiga 10 MG Tabs tablet Generic drug: dapagliflozin propanediol Take 1 tablet by mouth once daily   fluticasone 50 MCG/ACT nasal spray Commonly known as: Flonase Place 1 spray into both nostrils daily as needed for allergies.   gabapentin 600 MG tablet Commonly known as: NEURONTIN TAKE 1 & 1/2 (ONE & ONE-HALF) TABLETS BY MOUTH THREE TIMES DAILY FOR  NERVE  PAIN What changed: See the new instructions.   GAS-X PO Take 1 tablet by mouth daily.   hydrALAZINE 25 MG tablet Commonly known as: APRESOLINE Take 1 tablet (25 mg total) by mouth 3 (three) times daily.   HYDROcodone-acetaminophen 5-325 MG tablet Commonly known as: Norco Take 1 tablet by mouth every 4 (four) hours as needed for moderate pain.   isosorbide mononitrate 30 MG 24 hr tablet Commonly known  as: IMDUR Take 1 tablet (30 mg total) by mouth daily.   loratadine 10 MG tablet Commonly known as: Claritin Take 1 tablet (10 mg total) by mouth daily.   metoprolol succinate 25 MG 24 hr tablet Commonly known as: TOPROL-XL Take 1 tablet (25 mg total) by mouth daily.   montelukast 10 MG tablet Commonly known as: Singulair Take 1 tablet (10 mg total) by mouth at bedtime.   nicotine 21 mg/24hr patch Commonly known as: NICODERM CQ - dosed in mg/24 hours Place 1 patch (21 mg total) onto the skin daily.   polyethylene glycol 17 g packet Commonly known as:  MIRALAX / GLYCOLAX Take 17 g by mouth daily. What changed:  when to take this reasons to take this   potassium chloride SA 20 MEQ tablet Commonly known as: KLOR-CON M Take 2 tablets (40 mEq total) by mouth daily. What changed: how much to take   simvastatin 20 MG tablet Commonly known as: ZOCOR Take 1 tablet (20 mg total) by mouth daily.   spironolactone 25 MG tablet Commonly known as: ALDACTONE Take 1 tablet by mouth once daily   Stiolto Respimat 2.5-2.5 MCG/ACT Aers Generic drug: Tiotropium Bromide-Olodaterol INHALE 2 PUFFS BY MOUTH ONCE DAILY What changed: additional instructions   torsemide 20 MG tablet Commonly known as: DEMADEX Take 1 tablet (20 mg total) by mouth daily.   warfarin 5 MG tablet Commonly known as: COUMADIN Take as directed. If you are unsure how to take this medication, talk to your nurse or doctor. Original instructions: TAKE 1& 1/2  TABLETS BY MOUTH ONCE DAILY AT 4 PM. What changed:  how much to take how to take this when to take this additional instructions        Disposition and follow-up:   [name] was discharged from [facility] in Stable condition.  At the hospital follow up visit please address:  1.  Follow-up:  a. HFREF - ECHO with EF 35%. New right heart failure. Progression thought due to aflutter. Lasix stopped, torsemide 20 started per cards. On imdur, hydral, metop, spiro, farxiga. Cardiology follow up.     b. Atypical atrial flutter - appeared sinus but rate stayed steady at 100-110 despite activity/rest. Started on amiodarone. Cardioverted.  Need repeat EKG, ensure NSR   c. AKI - minimal PO with diuresis. Torsemide being held until clinic follow up. Needs BMP, ensure improving renal function.   d. MVR - started on amio. Takes warfarin at '5mg'$ . Needs INR check at clinic follow up.   2.  Labs / imaging needed at time of follow-up: CBC, BMP, INR, EKG  3.  Pending labs/ test needing follow-up: none  4.   Medication Changes  Started: Torsemide, amiodarone  Stopped: lasix  Follow-up Appointments:  Follow-up Information     Iona Coach, MD Follow up.   Why: GO: SEPT. 11 AT 2:45pm Contact information: [place] [address] [phone]         [facility] Follow up on 03/13/2022.   Specialty: Cardiology Why: at 2:30pm for your follow up appt Contact information: [address] [id number] [place] [facility] Course by problem list:   HFREF - Patient presented complaining of shortness of breath. Found to be volume overload and diuresed back to dry weight. EF noted to have decreased to 35% with new  right sided failure. Cardiology was consulted for progression of disease.  She was started on torsemide and noted to have ample response. Heart failure suspected to be due to aflutter. She was being kept NPO for DCCV/TEE and had limited PO intake and over-diuresed, Torsemide held until she can follow up in clinic.  Underwent TEE complicated by laryngospasm, and oxygen requirement afterwards. CXR showed stable findings. She was ambulated with no O2 requirement needed and discharged home with instructions to follow up with cardiology.   Atypical atrial flutter - patient noted to have stable heart rate between 100-110 on telemetry. Suspected this was aflutter by cardiology. Started on amiodarone. Planned for TEE with cardioversion. TEE complicated by laryngospasm, but no thrombus noted. DCCV postponed for following day and successfully cardioverted into NSR.   Mitral valve replacement - pharmacy consulted for warfarin dosing. She was started on amiodarone during admission and INR was noted to increase up to 4.7, likely due to interaction with amio. Warfarin was held in preparation for cardioversion and trended down. Scheduled for INR check at clinic follow up.   AKI -  elevation in Cr noted on admission, likley due to venous congestion. This improved with diuresis, however, due ot NPO status and ample response to torsemide, noted to over-diurese. Torsemide was held until she could follow up in [facility] clinic.   Discharge Subjective: Feeling well. Breathing well. Ambulated to restroom without complaint. Discussed needing to   Discharge Exam:   BP 122/65   Pulse 77   Temp 98 F (36.7 C) (Oral)   Resp (!) 26   Ht '5\' 5"'$  (1.651 m)   Wt 75.8 kg   SpO2 95%   BMI 27.79 kg/m  Constitutional: alert, sitting in bed, in no acute distress  HENT: normocephalic atraumatic   Cardiovascular: tachycardic, regular rhythm. Pulmonary: normal work of breathing,  clear to auscultation bilaterally Neurological: alert & oriented x 3 Skin: warm and dry, no LE edema   Pertinent Labs, Studies, and Procedures:     Latest Ref Rng & Units 02/26/2022    3:32 AM 02/25/2022    7:26 AM 02/24/2022   12:16 PM  CBC  WBC 4.0 - 10.5 K/uL 7.7  7.7  7.6   Hemoglobin 12.0 - 15.0 g/dL 12.2  13.1  13.4   Hematocrit 36.0 - 46.0 % 39.6  42.5  41.6   Platelets 150 - 400 K/uL 367  359  361        Latest Ref Rng & Units 02/26/2022    3:32 AM 02/25/2022    7:26 AM 02/24/2022   12:16 PM  CMP  Glucose 70 - 99 mg/dL 103  95  88   BUN 8 - 23 mg/dL '25  21  25   '$ Creatinine 0.44 - 1.00 mg/dL 1.47  1.11  1.23   Sodium 135 - 145 mmol/L 135  139  140   Potassium 3.5 - 5.1 mmol/L 3.0  3.4  3.9   Chloride 98 - 111 mmol/L 100  103  102   CO2 22 - 32 mmol/L '23  23  24   '$ Calcium 8.9 - 10.3 mg/dL 8.9  9.5  9.8     ECHOCARDIOGRAM COMPLETE  Result Date: 02/18/2022    ECHOCARDIOGRAM REPORT   Patient Name:   Kim Anthony Date of Exam: 02/18/2022 Medical Rec #:  [id number]     Height:       65.0 in Accession #:    [id number]    Weight:  179.2 lb Date of Birth:  1950/07/10      BSA:          1.888 m Patient Age:    11 years      BP:           136/111 mmHg Patient Gender: F             HR:           107 bpm. Exam  Location:  Inpatient Procedure: 2D Echo, Cardiac Doppler and Color Doppler Indications:    CHF  History:        Patient has prior history of Echocardiogram examinations, most                 recent 08/01/2021. CHF, CAD, COPD; Risk Factors:Hypertension and                 Dyslipidemia.                  Mitral Valve: mechanical valve valve is present in the mitral                 position.  Sonographer:    Memory Argue Referring Phys: [id number] [place]  1. Left ventricular ejection fraction, by estimation, is 35 to 40%. The left ventricle has moderately decreased function. The left ventricle demonstrates global hypokinesis. Left ventricular diastolic function could not be evaluated.  2. Right ventricular systolic function is severely reduced. The right ventricular size is severely enlarged. There is normal pulmonary artery systolic pressure. The estimated right ventricular systolic pressure is 49.7 mmHg.  3. Left atrial size was severely dilated.  4. Right atrial size was severely dilated.  5. Prosthetic disc motion appears normal. Gradients are probably increased due to tachycardia. The mitral valve has been repaired/replaced. No evidence of mitral valve regurgitation. No evidence of mitral stenosis. The mean mitral valve gradient is 8.0 mmHg with average heart rate of 107 bpm. There is a mechanical valve present in the mitral position.  6. The tricuspid valve is abnormal. Tricuspid valve regurgitation is severe.  7. The aortic valve is tricuspid. Aortic valve regurgitation is not visualized. No aortic stenosis is present.  8. The inferior vena cava is dilated in size with <50% respiratory variability, suggesting right atrial pressure of 15 mmHg. Comparison(s): Prior images reviewed side by side. The left ventricular function is unchanged. The right ventricular systolic function is significantly worse. Tricuspid regurgitation is substantially worse. Although mitral prosthesis gradients are higher,  this is probably explained by tachycardia, rather than prosthetic valve stenosis/dysfunction. FINDINGS  Left Ventricle: Left ventricular ejection fraction, by estimation, is 35 to 40%. The left ventricle has moderately decreased function. The left ventricle demonstrates global hypokinesis. The left ventricular internal cavity size was normal in size. There is no left ventricular hypertrophy. Abnormal (paradoxical) septal motion consistent with post-operative status. Left ventricular diastolic function could not be evaluated due to mitral valve replacement. Left ventricular diastolic function could not be evaluated. Right Ventricle: The right ventricular size is severely enlarged. Right vetricular wall thickness was not well visualized. Right ventricular systolic function is severely reduced. There is normal pulmonary artery systolic pressure. The tricuspid regurgitant velocity is 2.11 m/s, and with an assumed right atrial pressure of 15 mmHg, the estimated right ventricular systolic pressure is 02.6 mmHg. Left Atrium: Left atrial size was severely dilated. Right Atrium: Right atrial size was severely dilated. Pericardium: There is no evidence of pericardial effusion. Mitral Valve: Prosthetic disc  motion appears normal. Gradients are probably increased due to tachycardia. The mitral valve has been repaired/replaced. No evidence of mitral valve regurgitation. There is a mechanical valve present in the mitral position. No evidence of mitral valve stenosis. MV peak gradient, 12.8 mmHg. The mean mitral valve gradient is 8.0 mmHg with average heart rate of 107 bpm. Tricuspid Valve: Dilated tricuspid annulus with severe malcoaptation. The tricuspid valve is abnormal. Tricuspid valve regurgitation is severe. Aortic Valve: The aortic valve is tricuspid. Aortic valve regurgitation is not visualized. No aortic stenosis is present. Aortic valve mean gradient measures 2.0 mmHg. Aortic valve peak gradient measures 3.5 mmHg.  Aortic valve area, by VTI measures 1.76 cm. Pulmonic Valve: The pulmonic valve was normal in structure. Pulmonic valve regurgitation is not visualized. Aorta: The aortic root is normal in size and structure. Venous: The inferior vena cava is dilated in size with less than 50% respiratory variability, suggesting right atrial pressure of 15 mmHg. IAS/Shunts: No atrial level shunt detected by color flow Doppler.  LEFT VENTRICLE PLAX 2D LVIDd:         4.20 cm LVIDs:         3.30 cm LV PW:         1.10 cm LV IVS:        1.10 cm LVOT diam:     2.00 cm LV SV:         32 LV SV Index:   17 LVOT Area:     3.14 cm  LV Volumes (MOD) LV vol d, MOD A4C: 94.2 ml LV vol s, MOD A4C: 54.8 ml LV SV MOD A4C:     94.2 ml RIGHT VENTRICLE RV S prime:     8.05 cm/s TAPSE (M-mode): 1.6 cm LEFT ATRIUM              Index        RIGHT ATRIUM           Index LA Vol (A2C):   100.2 ml 53.05 ml/m  RA Area:     31.10 cm LA Vol (A4C):   70.7 ml  37.45 ml/m  RA Volume:   111.00 ml 58.79 ml/m LA Biplane Vol: 80.5 ml  42.64 ml/m  AORTIC VALVE AV Area (Vmax):    2.18 cm AV Area (Vmean):   1.90 cm AV Area (VTI):     1.76 cm AV Vmax:           93.40 cm/s AV Vmean:          68.400 cm/s AV VTI:            0.180 m AV Peak Grad:      3.5 mmHg AV Mean Grad:      2.0 mmHg LVOT Vmax:         64.90 cm/s LVOT Vmean:        41.300 cm/s LVOT VTI:          0.101 m LVOT/AV VTI ratio: 0.56  AORTA Ao Root diam: 3.50 cm MITRAL VALVE              TRICUSPID VALVE MV Area VTI:  1.35 cm    TR Peak grad:   17.8 mmHg MV Peak grad: 12.8 mmHg   TR Vmax:        211.00 cm/s MV Mean grad: 8.0 mmHg MV Vmax:      1.79 m/s    SHUNTS MV Vmean:     137.0 cm/s  Systemic VTI:  0.10 m  Systemic Diam: 2.00 cm Sanda Klein MD Electronically signed by Sanda Klein MD Signature Date/Time: 02/18/2022/3:06:18 PM    Final    US RENAL  Result Date: 02/18/2022 CLINICAL DATA:  Acute kidney injury. EXAM: RENAL / URINARY TRACT ULTRASOUND COMPLETE COMPARISON:   None Available. FINDINGS: Right Kidney: Renal measurements: 12.3 x 5.2 x 5.4 cm = volume: 182 mL. Echogenicity within normal limits. No mass or hydronephrosis visualized. Left Kidney: Renal measurements: 10.6 x 5.7 x 4.8 cm = volume: 151.6 mL. Echogenicity within normal limits. No mass or hydronephrosis visualized. Bladder: Appears normal for degree of bladder distention. Other: Ascites noted within the right upper quadrant of the abdomen. IMPRESSION: 1. Normal appearance of the kidneys. No mass or hydronephrosis identified. 2. Right upper quadrant ascites. Electronically Signed   By: Kerby Moors M.D.   On: 02/18/2022 06:17   DG Chest Portable 1 View  Result Date: 02/18/2022 CLINICAL DATA:  Shortness of breath. EXAM: PORTABLE CHEST 1 VIEW COMPARISON:  02/04/2022. FINDINGS: Heart is enlarged and the mediastinal contour stable. The pulmonary vasculature is prominent. Atherosclerotic calcification of the aorta is noted. Sternotomy wires are present over the midline and a prosthetic cardiac valve is noted. Interstitial prominence is present bilaterally. No consolidation, effusion, or pneumothorax. Surgical clips are present in the left axilla. IMPRESSION: 1. Cardiomegaly with prominent pulmonary vasculature. 2. Interstitial prominence bilaterally, possible edema or infiltrate. Electronically Signed   By: Brett Fairy M.D.   On: 02/18/2022 02:40     Discharge Instructions: Discharge Instructions     Call MD for:  difficulty breathing, headache or visual disturbances   Complete by: As directed    Call MD for:  extreme fatigue   Complete by: As directed    Call MD for:  hives   Complete by: As directed    Call MD for:  persistant dizziness or light-headedness   Complete by: As directed    Call MD for:  persistant nausea and vomiting   Complete by: As directed    Call MD for:  redness, tenderness, or signs of infection (pain, swelling, redness, odor or green/yellow discharge around incision site)    Complete by: As directed    Call MD for:  severe uncontrolled pain   Complete by: As directed    Call MD for:  temperature >100.4   Complete by: As directed    Diet - low sodium heart healthy   Complete by: As directed    Increase activity slowly   Complete by: As directed        Signed: Delene Ruffini, MD 02/26/2022, 4:24 PM   Pager: [phone]

## 2022-02-25 NOTE — H&P (View-Only) (Signed)
Rounding Note    Patient Name: Kim Anthony Date of Encounter: 02/25/2022  [place] Cardiologist: None   Subjective   Pt was seen bedside this AM. She had one episode of spitting out mucus while present in the room. She denies any chest pain, shortness of breath, nausea, or vomiting.   Inpatient Medications    Scheduled Meds:  amiodarone  400 mg Oral BID   arformoterol  15 mcg Nebulization BID   And   umeclidinium bromide  1 puff Inhalation Daily   dapagliflozin propanediol  10 mg Oral Daily   fluticasone  2 spray Each Nare BID   hydrALAZINE  25 mg Oral TID   isosorbide mononitrate  30 mg Oral Daily   metoprolol succinate  25 mg Oral Daily   montelukast  10 mg Oral QHS   nicotine  7 mg Transdermal Daily   ramelteon  8 mg Oral QHS   simvastatin  20 mg Oral Daily   spironolactone  12.5 mg Oral Daily   torsemide  10 mg Oral Daily   Warfarin - Pharmacist Dosing Inpatient   Does not apply q1600   Continuous Infusions:  PRN Meds: albuterol, dextromethorphan-guaiFENesin, guaiFENesin-dextromethorphan, HYDROcodone-acetaminophen, polyethylene glycol, simethicone, sodium chloride   Vital Signs    Vitals:   02/24/22 1937 02/25/22 0831 02/25/22 0839 02/25/22 0841  BP:  106/83    Pulse:  95 (!) 105   Resp:  16 18   Temp:  98 F (36.7 C)    TempSrc:  Oral    SpO2: 96%  97% 97%  Weight:      Height:        Intake/Output Summary (Last 24 hours) at 02/25/2022 1003 Last data filed at 02/25/2022 0754 Gross per 24 hour  Intake 597 ml  Output --  Net 597 ml      02/24/2022    5:50 AM 02/23/2022    4:35 AM 02/22/2022    5:35 AM  Last 3 Weights  Weight (lbs) 167 lb 169 lb 3.2 oz 171 lb  Weight (kg) 75.751 kg 76.749 kg 77.565 kg      Telemetry    Atrial Flutter rhythm   ECG    No new ECG   Physical Exam   GEN: No acute distress.   Neck: No JVD Cardiac: Tachycardic no murmurs, rubs, or gallops.  Respiratory: Clear to auscultation bilaterally. GI: Soft,  nontender, non-distended  MS: No edema; No deformity. Neuro:  Nonfocal  Psych: Normal affect   Labs    High Sensitivity Troponin:   Recent Labs  Lab 02/04/22 0934 02/18/22 0217 02/18/22 0519  TROPONINIHS 15 18* 18*     Chemistry Recent Labs  Lab 02/23/22 0711 02/24/22 0246 02/24/22 1216 02/25/22 0726  NA 139 137 140 139  K 4.4 4.1 3.9 3.4*  CL 104 102 102 103  CO2 '22 24 24 23  '$ GLUCOSE 107* 92 88 95  BUN 27* 24* 25* 21  CREATININE 1.18* 1.13* 1.23* 1.11*  CALCIUM 9.9 9.4 9.8 9.5  MG 2.0 2.0  --  1.9  GFRNONAA 49* 52* 47* 53*  ANIONGAP '13 11 14 13    '$ Lipids  Recent Labs  Lab 02/20/22 0531  CHOL 93  TRIG 110  HDL 27*  LDLCALC 44  CHOLHDL 3.4    Hematology Recent Labs  Lab 02/21/22 0322 02/24/22 1216 02/25/22 0726  WBC 7.0 7.6 7.7  RBC 4.24 4.43 4.47  HGB 12.5 13.4 13.1  HCT 39.8 41.6 42.5  MCV 93.9 93.9 95.1  MCH 29.5 30.2 29.3  MCHC 31.4 32.2 30.8  RDW 13.7 13.8 14.0  PLT 317 361 359   Thyroid No results for input(s): "TSH", "FREET4" in the last 168 hours.  BNPNo results for input(s): "BNP", "PROBNP" in the last 168 hours.  DDimer No results for input(s): "DDIMER" in the last 168 hours.   Radiology    No results found.  Cardiac Studies     Echo 02/18/22 1. Left ventricular ejection fraction, by estimation, is 35 to 40%. The  left ventricle has moderately decreased function. The left ventricle  demonstrates global hypokinesis. Left ventricular diastolic function could  not be evaluated.   2. Right ventricular systolic function is severely reduced. The right  ventricular size is severely enlarged. There is normal pulmonary artery  systolic pressure. The estimated right ventricular systolic pressure is  24.4 mmHg.   3. Left atrial size was severely dilated.   4. Right atrial size was severely dilated.   5. Prosthetic disc motion appears normal. Gradients are probably  increased due to tachycardia. The mitral valve has been  repaired/replaced.  No evidence of mitral valve regurgitation. No evidence of mitral stenosis.  The mean mitral valve gradient is 8.0  mmHg with average heart rate of 107 bpm. There is a mechanical valve  present in the mitral position.   6. The tricuspid valve is abnormal. Tricuspid valve regurgitation is  severe.   7. The aortic valve is tricuspid. Aortic valve regurgitation is not  visualized. No aortic stenosis is present.   8. The inferior vena cava is dilated in size with <50% respiratory  variability, suggesting right atrial pressure of 15 mmHg.   R/LHC 01/09/21  Mid RCA lesion is 100% stenosed.   Ost Cx to Prox Cx lesion is 30% stenosed.   Dist Cx lesion is 30% stenosed.   Dist LAD lesion is 40% stenosed.   Findings:   Ao = 157/85 (114) LV = 150/20 RA = 9 RV = 51/9 PA = 48/19 (32) PCW = 14 Fick cardiac output/index = 5.4/2.9 PVR = 3.3 WU FA sat = 95% PA sat = 73%, 69%   Assessment: 1v CAD with chronic occlusion of mRCA Relatively well-compensated filling pressures with mild pulmonary HTN Normal cardiac output   Difficult coronary ostia to engage due to tortuous subclavian artery. JL 3.5 use for left system LCB used for the RCA which had a high anterior takeoff  Patient Profile     72 y.o. female w/ chronic HFpEF EF of 35-40%, mitral valve replacement on chronic coumadin, COPD, CAD, Hx of PE, who presented to the emergency department with shortness of breath and LE Edema. Found to be in chronic systolic HF exacerbation.   Assessment & Plan    #Acute on Chronic Systolic HF in the setting of persistent Atrial Flutter #Severe RV Dysfunction  Pt has a history of worsening SOB and LE Edema found to be in acute on chronic systolic HF exacerbation, possibly related to persistent atrial flutter. Echo done on 8/30 showed a LVEF of 35-40% with global hypokinesis, severe RV dysfunction with severe RV enlargement, mean mitral valve gradient 83mHg, and severe TR. She is  currently on IV Diuresis with torsemide '20mg'$  daily. I/O have not been calculated today, neither has weight. Last weight checked was 167, down from admission weight of 179.   Plan:  -Continue torsemide '20mg'$ , pending BMP today for evaluation of kidney function  -Continue Toprol XL '25mg'$  - Continue Hydralazine '25mg'$   TID + Imdur '30mg'$  daily  - Continue Farxiga '10mg'$  daily  - Continue spironolactone 12.'5mg'$  daily   #History of Mechanical MVR Pt had MVR in 2001, last TTE with mean mitral gradient 76mHg. Is currently on warfarin chronically  - Continue warfarin  - Continue metoprolol   #Atypical Atrial Flutter  CHADs-Vasc score is 5, HR today during examination was 105, and pt was tachycardic on auscultation. TEE will be done today, as INR this AM was 3.7.  Plan:  - TEE will be done today   #CAD Occluded mRCA on cath, no history of grafts. Pt denies any current chest pain, will continue medical therapy with simvastatin.       For questions or updates, please contact [place]Please consult [email or web address] for contact info under        Signed, SDrucie Opitz MD  02/25/2022, 10:03 AM     Patient seen and examined and agree with SDrucie Opitz MD as detailed above.  In brief, the patient is a 71y.o. female  with a hx of mechanical MVR in 2001; CAD s/p CABG; history of breast cancer s/p L mastectomy, radiation, chemo; COPD with tobacco use; prior DVT/PE who presented with worsening SOB and LE edema found to have acute on chronic systolic HF exacerbation for which Cardiology was consulted. Course was complicated by atypical flutter for which she is scheduled for TEE/DCCV today.  Patient with known systolic HF with LVEF 367-67%with global hypokinesis, severe RV dysfunction with severe RV enlargement, mean mitral valve gradient 874mg, severe TR. She also has history of MVR in 2001 for which she has been on warfarin. Presented on this admission with volume overload and aflutter with  RVR. She responded well to diuresis and has been transitioned to PO torsemide. She is planned for TEE/DCCV today as INR improved to 3.9.  Currently, the patient feels well. States she is ready to go home when possible. No chest pain or SOB.   GEN: No acute distress.   Neck: No JVD Cardiac: Tachycardic, regular, +mechanical click  Respiratory: Clear to auscultation bilaterally. GI: Soft, nontender, non-distended  MS: No edema; No deformity. Neuro:  Nonfocal  Psych: Normal affect    Plan: -INR 3.9 today; plan for TEE/DCCV this afternoon - Continue torsemide '20mg'$  daily - Continue Toprol XL 25 mg daily. - Continue hydralazine 25 mg tid + Imdur 30 mg daily - Continue Farxiga 10 mg daily - Continue spironolactone 12.'5mg'$  daily - On amiodarone for Aflutter; will continue amio load  - Warfarin dosing per pharmacy  TEE/DCCV discussed with patient and her daughter on the phone and they are amenable to proceed.  HeGwyndolyn KaufmanMD

## 2022-02-25 NOTE — Interval H&P Note (Signed)
History and Physical Interval Note:  02/25/2022 11:52 AM  Kim Anthony  has presented today for surgery, with the diagnosis of atrial fibrillation.  The various methods of treatment have been discussed with the patient and family. After consideration of risks, benefits and other options for treatment, the patient has consented to  Procedure(s): TRANSESOPHAGEAL ECHOCARDIOGRAM (TEE) (N/A) CARDIOVERSION (N/A) as a surgical intervention.  The patient's history has been reviewed, patient examined, no change in status, stable for surgery.  I have reviewed the patient's chart and labs.  Questions were answered to the patient's satisfaction.     Pixie Casino

## 2022-02-25 NOTE — Progress Notes (Addendum)
Rounding Note    Patient Name: Kim Anthony Date of Encounter: 02/25/2022  [place] Cardiologist: None   Subjective   Pt was seen bedside this AM. She had one episode of spitting out mucus while present in the room. She denies any chest pain, shortness of breath, nausea, or vomiting.   Inpatient Medications    Scheduled Meds:  amiodarone  400 mg Oral BID   arformoterol  15 mcg Nebulization BID   And   umeclidinium bromide  1 puff Inhalation Daily   dapagliflozin propanediol  10 mg Oral Daily   fluticasone  2 spray Each Nare BID   hydrALAZINE  25 mg Oral TID   isosorbide mononitrate  30 mg Oral Daily   metoprolol succinate  25 mg Oral Daily   montelukast  10 mg Oral QHS   nicotine  7 mg Transdermal Daily   ramelteon  8 mg Oral QHS   simvastatin  20 mg Oral Daily   spironolactone  12.5 mg Oral Daily   torsemide  10 mg Oral Daily   Warfarin - Pharmacist Dosing Inpatient   Does not apply q1600   Continuous Infusions:  PRN Meds: albuterol, dextromethorphan-guaiFENesin, guaiFENesin-dextromethorphan, HYDROcodone-acetaminophen, polyethylene glycol, simethicone, sodium chloride   Vital Signs    Vitals:   02/24/22 1937 02/25/22 0831 02/25/22 0839 02/25/22 0841  BP:  106/83    Pulse:  95 (!) 105   Resp:  16 18   Temp:  98 F (36.7 C)    TempSrc:  Oral    SpO2: 96%  97% 97%  Weight:      Height:        Intake/Output Summary (Last 24 hours) at 02/25/2022 1003 Last data filed at 02/25/2022 0754 Gross per 24 hour  Intake 597 ml  Output --  Net 597 ml      02/24/2022    5:50 AM 02/23/2022    4:35 AM 02/22/2022    5:35 AM  Last 3 Weights  Weight (lbs) 167 lb 169 lb 3.2 oz 171 lb  Weight (kg) 75.751 kg 76.749 kg 77.565 kg      Telemetry    Atrial Flutter rhythm   ECG    No new ECG   Physical Exam   GEN: No acute distress.   Neck: No JVD Cardiac: Tachycardic no murmurs, rubs, or gallops.  Respiratory: Clear to auscultation bilaterally. GI: Soft,  nontender, non-distended  MS: No edema; No deformity. Neuro:  Nonfocal  Psych: Normal affect   Labs    High Sensitivity Troponin:   Recent Labs  Lab 02/04/22 0934 02/18/22 0217 02/18/22 0519  TROPONINIHS 15 18* 18*     Chemistry Recent Labs  Lab 02/23/22 0711 02/24/22 0246 02/24/22 1216 02/25/22 0726  NA 139 137 140 139  K 4.4 4.1 3.9 3.4*  CL 104 102 102 103  CO2 '22 24 24 23  '$ GLUCOSE 107* 92 88 95  BUN 27* 24* 25* 21  CREATININE 1.18* 1.13* 1.23* 1.11*  CALCIUM 9.9 9.4 9.8 9.5  MG 2.0 2.0  --  1.9  GFRNONAA 49* 52* 47* 53*  ANIONGAP '13 11 14 13    '$ Lipids  Recent Labs  Lab 02/20/22 0531  CHOL 93  TRIG 110  HDL 27*  LDLCALC 44  CHOLHDL 3.4    Hematology Recent Labs  Lab 02/21/22 0322 02/24/22 1216 02/25/22 0726  WBC 7.0 7.6 7.7  RBC 4.24 4.43 4.47  HGB 12.5 13.4 13.1  HCT 39.8 41.6 42.5  MCV 93.9 93.9 95.1  MCH 29.5 30.2 29.3  MCHC 31.4 32.2 30.8  RDW 13.7 13.8 14.0  PLT 317 361 359   Thyroid No results for input(s): "TSH", "FREET4" in the last 168 hours.  BNPNo results for input(s): "BNP", "PROBNP" in the last 168 hours.  DDimer No results for input(s): "DDIMER" in the last 168 hours.   Radiology    No results found.  Cardiac Studies     Echo 02/18/22 1. Left ventricular ejection fraction, by estimation, is 35 to 40%. The  left ventricle has moderately decreased function. The left ventricle  demonstrates global hypokinesis. Left ventricular diastolic function could  not be evaluated.   2. Right ventricular systolic function is severely reduced. The right  ventricular size is severely enlarged. There is normal pulmonary artery  systolic pressure. The estimated right ventricular systolic pressure is  17.4 mmHg.   3. Left atrial size was severely dilated.   4. Right atrial size was severely dilated.   5. Prosthetic disc motion appears normal. Gradients are probably  increased due to tachycardia. The mitral valve has been  repaired/replaced.  No evidence of mitral valve regurgitation. No evidence of mitral stenosis.  The mean mitral valve gradient is 8.0  mmHg with average heart rate of 107 bpm. There is a mechanical valve  present in the mitral position.   6. The tricuspid valve is abnormal. Tricuspid valve regurgitation is  severe.   7. The aortic valve is tricuspid. Aortic valve regurgitation is not  visualized. No aortic stenosis is present.   8. The inferior vena cava is dilated in size with <50% respiratory  variability, suggesting right atrial pressure of 15 mmHg.   R/LHC 01/09/21  Mid RCA lesion is 100% stenosed.   Ost Cx to Prox Cx lesion is 30% stenosed.   Dist Cx lesion is 30% stenosed.   Dist LAD lesion is 40% stenosed.   Findings:   Ao = 157/85 (114) LV = 150/20 RA = 9 RV = 51/9 PA = 48/19 (32) PCW = 14 Fick cardiac output/index = 5.4/2.9 PVR = 3.3 WU FA sat = 95% PA sat = 73%, 69%   Assessment: 1v CAD with chronic occlusion of mRCA Relatively well-compensated filling pressures with mild pulmonary HTN Normal cardiac output   Difficult coronary ostia to engage due to tortuous subclavian artery. JL 3.5 use for left system LCB used for the RCA which had a high anterior takeoff  Patient Profile     71 y.o. female w/ chronic HFpEF EF of 35-40%, mitral valve replacement on chronic coumadin, COPD, CAD, Hx of PE, who presented to the emergency department with shortness of breath and LE Edema. Found to be in chronic systolic HF exacerbation.   Assessment & Plan    #Acute on Chronic Systolic HF in the setting of persistent Atrial Flutter #Severe RV Dysfunction  Pt has a history of worsening SOB and LE Edema found to be in acute on chronic systolic HF exacerbation, possibly related to persistent atrial flutter. Echo done on 8/30 showed a LVEF of 35-40% with global hypokinesis, severe RV dysfunction with severe RV enlargement, mean mitral valve gradient 12mHg, and severe TR. She is  currently on IV Diuresis with torsemide '20mg'$  daily. I/O have not been calculated today, neither has weight. Last weight checked was 167, down from admission weight of 179.   Plan:  -Continue torsemide '20mg'$ , pending BMP today for evaluation of kidney function  -Continue Toprol XL '25mg'$  - Continue Hydralazine '25mg'$   TID + Imdur '30mg'$  daily  - Continue Farxiga '10mg'$  daily  - Continue spironolactone 12.'5mg'$  daily   #History of Mechanical MVR Pt had MVR in 2001, last TTE with mean mitral gradient 59mHg. Is currently on warfarin chronically  - Continue warfarin  - Continue metoprolol   #Atypical Atrial Flutter  CHADs-Vasc score is 5, HR today during examination was 105, and pt was tachycardic on auscultation. TEE will be done today, as INR this AM was 3.7.  Plan:  - TEE will be done today   #CAD Occluded mRCA on cath, no history of grafts. Pt denies any current chest pain, will continue medical therapy with simvastatin.       For questions or updates, please contact CSanta MonicaPlease consult [email or web address] for contact info under        Signed, SDrucie Opitz MD  02/25/2022, 10:03 AM     Patient seen and examined and agree with SDrucie Opitz MD as detailed above.  In brief, the patient is a 71y.o. female  with a hx of mechanical MVR in 2001; CAD s/p CABG; history of breast cancer s/p L mastectomy, radiation, chemo; COPD with tobacco use; prior DVT/PE who presented with worsening SOB and LE edema found to have acute on chronic systolic HF exacerbation for which Cardiology was consulted. Course was complicated by atypical flutter for which she is scheduled for TEE/DCCV today.  Patient with known systolic HF with LVEF 332-54%with global hypokinesis, severe RV dysfunction with severe RV enlargement, mean mitral valve gradient 827mg, severe TR. She also has history of MVR in 2001 for which she has been on warfarin. Presented on this admission with volume overload and aflutter with  RVR. She responded well to diuresis and has been transitioned to PO torsemide. She is planned for TEE/DCCV today as INR improved to 3.9.  Currently, the patient feels well. States she is ready to go home when possible. No chest pain or SOB.   GEN: No acute distress.   Neck: No JVD Cardiac: Tachycardic, regular, +mechanical click  Respiratory: Clear to auscultation bilaterally. GI: Soft, nontender, non-distended  MS: No edema; No deformity. Neuro:  Nonfocal  Psych: Normal affect    Plan: -INR 3.9 today; plan for TEE/DCCV this afternoon - Continue torsemide '20mg'$  daily - Continue Toprol XL 25 mg daily. - Continue hydralazine 25 mg tid + Imdur 30 mg daily - Continue Farxiga 10 mg daily - Continue spironolactone 12.'5mg'$  daily - On amiodarone for Aflutter; will continue amio load  - Warfarin dosing per pharmacy  TEE/DCCV discussed with patient and her daughter on the phone and they are amenable to proceed.  HeGwyndolyn KaufmanMD

## 2022-02-25 NOTE — Anesthesia Postprocedure Evaluation (Signed)
Anesthesia Post Note  Patient: Kim Anthony  Procedure(s) Performed: ABORTED TEE      Anesthesia Post Evaluation No notable events documented.  Last Vitals:  Vitals:   02/25/22 1310 02/25/22 1325  BP: 117/81 121/73  Pulse: (!) 104 (!) 104  Resp: (!) 22 (!) 22  Temp:  36.5 C  SpO2: 98% 97%    Last Pain:  Vitals:   02/25/22 1325  TempSrc:   PainSc: 0-No pain                 Domanique Huesman

## 2022-02-25 NOTE — CV Procedure (Signed)
TRANSESOPHAGEAL ECHOCARDIOGRAM (TEE) NOTE  INDICATIONS: Atrial flutter, CHF  PROCEDURE:   Informed consent was obtained prior to the procedure. The risks, benefits and alternatives for the procedure were discussed and the patient comprehended these risks.  Risks include, but are not limited to, cough, sore throat, vomiting, nausea, somnolence, esophageal and stomach trauma or perforation, bleeding, low blood pressure, aspiration, pneumonia, infection, trauma to the teeth and death.    After a procedural time-out, the patient was given propofol for sedation by anesthesia. See their separate report.  The patient's heart rate, blood pressure, and oxygen saturation are monitored continuously during the procedure.The oropharynx was anesthetized with topical cetacaine.  The transesophageal probe was inserted in the esophagus and stomach without difficulty and multiple views were obtained.  The patient was kept under observation until the patient left the procedure room.  I was present face-to-face 100% of this time. The patient left the procedure room in stable condition.   Agitated microbubble saline contrast was not administered.  COMPLICATIONS:    After successful placement of the TEE probe in the esophagus, several images were obtained, however, the patient developed worsening secretions and cough and then had laryngospasm. Due to worsening hypoxia and the need for airway management the TEE probe was removed to allow anesthesia to stabilize her airway. It was deemed unsafe to re-attempt the procedure.  Findings:  LEFT VENTRICLE: The left ventricular wall thickness is normal.  The left ventricular cavity is dilated in size. Wall motion is globally hypokinetic.  LVEF is 35-40%.  RIGHT VENTRICLE:  The right was not fully visualized, but appears hypokinetic.  LEFT ATRIUM:  The left atrium is severely dilated in size without any thrombus or masses.  There is not spontaneous echo contrast  ("smoke") in the left atrium consistent with a low flow state.  LEFT ATRIAL APPENDAGE:  The left atrial appendage is free of any thrombus or masses. The appendage has single lobes. Pulse doppler indicates moderate flow in the appendage.  ATRIAL SEPTUM:  Not completely visualized  RIGHT ATRIUM:  The right atrium was not visualized.  MITRAL VALVE:  The mitral valve has been replaced by a mechanical tilting disc valve which appears to have normal leaflet motion. Gradients could not be obtained due to patient airway issues.   AORTIC VALVE:  The aortic valve was not evaluated.  TRICUSPID VALVE:  The tricuspid valve was not evaluated.   PULMONIC VALVE:  The pulmonic valve was not evaluated.   AORTIC ARCH, ASCENDING AND DESCENDING AORTA:  Not evaluated  12. PULMONARY VEINS: Anomalous pulmonary venous return was not noted.  13. PERICARDIUM: The pericardium appeared normal and non-thickened.  There is no pericardial effusion.  IMPRESSION:   TEE procedure aborted during the procedure after limited imaging due to airway issues for safety. No LAA thrombus was noted The mechanical tilting disc mitral valve leaflet motion appeared normal. Severe LAE LVEF 35-40% with global hypokinesis and RV hypokinesis.  RECOMMENDATIONS:    Patient transferred to PACU for airway management and nebulizer. Once stabilized, can return to the floor. Will schedule for DCCV tomorrow (no TEE necessary), given persistent atrial flutter and no LA thrombus noted.  Time Spent Directly with the Patient:  60 minutes   Pixie Casino, MD, [facility], [place] Director of the [facility] Lipid Disorders &  Cardiovascular [facility] Diplomate of the American Board of Clinical Lipidology Attending Cardiologist  Direct Dial: [phone]  Fax: [phone]  Website:  [email or web address] 02/25/2022, 12:47 PM

## 2022-02-25 NOTE — Progress Notes (Signed)
   Patient went for TEE/DCCV earlier today but developed laryngospasm with worsening secretions and cough after successful placement of TEE probe. Enough images were able to be obtained to ensure patient did not have a left appendate thrombus but procedure had to be aborted and TEE probe removed to allow anesthesia to stabilize her airway. She was taken to PACU for airway management and nebulizer and then returned to the floor. Chest x-ray showed showed chronic cardiac enlargement and chronic interstitial process but no acute overlying cardiopulmonary findings. I went to check on patient this afternoon and she is resting comfortably on 2 L of nasal cannula. O2 sat in the high 90s. She states she feels back to baseline. I don't appreciate any crackles on exam and she looks euvolemic so I will hold off on additional IV diuresis. She is agreeable to proceeding with DCCV alone tomorrow. Will make NPO at midnight.  Darreld Mclean, PA-C 02/25/2022 3:21 PM

## 2022-02-26 ENCOUNTER — Inpatient Hospital Stay (HOSPITAL_COMMUNITY): Payer: Medicare Other

## 2022-02-26 ENCOUNTER — Inpatient Hospital Stay (HOSPITAL_COMMUNITY): Payer: Medicare Other | Admitting: Certified Registered"

## 2022-02-26 ENCOUNTER — Telehealth: Payer: Self-pay | Admitting: *Deleted

## 2022-02-26 ENCOUNTER — Encounter (HOSPITAL_COMMUNITY): Payer: Self-pay | Admitting: Internal Medicine

## 2022-02-26 ENCOUNTER — Encounter (HOSPITAL_COMMUNITY)
Admission: EM | Disposition: A | Discharge status: Home / Self Care | Payer: Self-pay | Source: Home / Self Care | Attending: Internal Medicine

## 2022-02-26 DIAGNOSIS — Z952 Presence of prosthetic heart valve: Secondary | ICD-10-CM | POA: Diagnosis not present

## 2022-02-26 DIAGNOSIS — I5043 Acute on chronic combined systolic (congestive) and diastolic (congestive) heart failure: Secondary | ICD-10-CM | POA: Diagnosis not present

## 2022-02-26 DIAGNOSIS — I132 Hypertensive heart and chronic kidney disease with heart failure and with stage 5 chronic kidney disease, or end stage renal disease: Secondary | ICD-10-CM | POA: Diagnosis not present

## 2022-02-26 DIAGNOSIS — I130 Hypertensive heart and chronic kidney disease with heart failure and stage 1 through stage 4 chronic kidney disease, or unspecified chronic kidney disease: Secondary | ICD-10-CM | POA: Diagnosis not present

## 2022-02-26 DIAGNOSIS — F17210 Nicotine dependence, cigarettes, uncomplicated: Secondary | ICD-10-CM

## 2022-02-26 DIAGNOSIS — Z20822 Contact with and (suspected) exposure to covid-19: Secondary | ICD-10-CM | POA: Diagnosis not present

## 2022-02-26 DIAGNOSIS — I4892 Unspecified atrial flutter: Secondary | ICD-10-CM

## 2022-02-26 DIAGNOSIS — N179 Acute kidney failure, unspecified: Secondary | ICD-10-CM | POA: Diagnosis not present

## 2022-02-26 DIAGNOSIS — I5042 Chronic combined systolic (congestive) and diastolic (congestive) heart failure: Secondary | ICD-10-CM

## 2022-02-26 DIAGNOSIS — I2510 Atherosclerotic heart disease of native coronary artery without angina pectoris: Secondary | ICD-10-CM

## 2022-02-26 DIAGNOSIS — N1832 Chronic kidney disease, stage 3b: Secondary | ICD-10-CM

## 2022-02-26 DIAGNOSIS — I484 Atypical atrial flutter: Secondary | ICD-10-CM | POA: Diagnosis not present

## 2022-02-26 HISTORY — PX: CARDIOVERSION: SHX1299

## 2022-02-26 LAB — BASIC METABOLIC PANEL
Anion gap: 12 (ref 5–15)
BUN: 25 mg/dL — ABNORMAL HIGH (ref 8–23)
CO2: 23 mmol/L (ref 22–32)
Calcium: 8.9 mg/dL (ref 8.9–10.3)
Chloride: 100 mmol/L (ref 98–111)
Creatinine, Ser: 1.47 mg/dL — ABNORMAL HIGH (ref 0.44–1.00)
GFR, Estimated: 38 mL/min — ABNORMAL LOW (ref >60)
Glucose, Bld: 103 mg/dL — ABNORMAL HIGH (ref 70–99)
Potassium: 3 mmol/L — ABNORMAL LOW (ref 3.5–5.1)
Sodium: 135 mmol/L (ref 135–145)

## 2022-02-26 LAB — CBC
HCT: 39.6 % (ref 36.0–46.0)
Hemoglobin: 12.2 g/dL (ref 12.0–15.0)
MCH: 29.4 pg (ref 26.0–34.0)
MCHC: 30.8 g/dL (ref 30.0–36.0)
MCV: 95.4 fL (ref 80.0–100.0)
Platelets: 367 10*3/uL (ref 150–400)
RBC: 4.15 MIL/uL (ref 3.87–5.11)
RDW: 13.7 % (ref 11.5–15.5)
WBC: 7.7 10*3/uL (ref 4.0–10.5)
nRBC: 0 % (ref 0.0–0.2)

## 2022-02-26 LAB — OSMOLALITY: Osmolality: 300 mOsm/kg — ABNORMAL HIGH (ref 275–295)

## 2022-02-26 LAB — MAGNESIUM: Magnesium: 2 mg/dL (ref 1.7–2.4)

## 2022-02-26 SURGERY — CARDIOVERSION
Anesthesia: General

## 2022-02-26 MED ORDER — POTASSIUM CHLORIDE CRYS ER 20 MEQ PO TBCR
40.0000 meq | EXTENDED_RELEASE_TABLET | Freq: Two times a day (BID) | ORAL | Status: DC
  Filled 2022-02-26: qty 2

## 2022-02-26 MED ORDER — TORSEMIDE 20 MG PO TABS: 20.0000 mg | ORAL_TABLET | Freq: Every day | ORAL | 0 refills | Status: DC

## 2022-02-26 MED ORDER — LIDOCAINE 2% (20 MG/ML) 5 ML SYRINGE
INTRAMUSCULAR | Status: DC | PRN
  Administered 2022-02-26: 100 mg via INTRAVENOUS

## 2022-02-26 MED ORDER — AMIODARONE HCL 200 MG PO TABS: 200.0000 mg | ORAL_TABLET | Freq: Two times a day (BID) | ORAL | 0 refills | Status: DC

## 2022-02-26 MED ORDER — AMIODARONE HCL 400 MG PO TABS: 400.0000 mg | ORAL_TABLET | Freq: Two times a day (BID) | ORAL | 0 refills | Status: DC

## 2022-02-26 MED ORDER — AMIODARONE HCL 200 MG PO TABS
400.0000 mg | ORAL_TABLET | Freq: Two times a day (BID) | ORAL | Status: DC
Start: 2022-02-26 — End: 2022-02-26

## 2022-02-26 MED ORDER — AMIODARONE HCL 200 MG PO TABS: 200.0000 mg | ORAL_TABLET | Freq: Every day | ORAL | 0 refills | Status: DC

## 2022-02-26 MED ORDER — PROPOFOL 10 MG/ML IV BOLUS
INTRAVENOUS | Status: DC | PRN
  Administered 2022-02-26: 50 mg via INTRAVENOUS

## 2022-02-26 MED ORDER — AMIODARONE HCL 200 MG PO TABS: 200.0000 mg | ORAL_TABLET | Freq: Every day | ORAL | Status: DC

## 2022-02-26 MED ORDER — AMIODARONE HCL 200 MG PO TABS
200.0000 mg | ORAL_TABLET | Freq: Two times a day (BID) | ORAL | Status: DC
Start: 2022-02-28 — End: 2022-02-26

## 2022-02-26 MED ORDER — POTASSIUM CHLORIDE 20 MEQ PO PACK
40.0000 meq | PACK | ORAL | Status: DC
  Administered 2022-02-26: 40 meq via ORAL
  Filled 2022-02-26: qty 2

## 2022-02-26 MED ORDER — WARFARIN SODIUM 5 MG PO TABS: 5.0000 mg | ORAL_TABLET | Freq: Every day | ORAL | 0 refills | Status: DC

## 2022-02-26 MED ORDER — SODIUM CHLORIDE 0.9 % IV SOLN
INTRAVENOUS | Status: AC | PRN
  Administered 2022-02-26: 500 mL via INTRAVENOUS

## 2022-02-26 NOTE — Progress Notes (Signed)
   02/26/22 1600  Mobility  Activity Ambulated with assistance in hallway  Level of Assistance Contact guard assist, steadying assist  Assistive Device Four wheel walker  Distance Ambulated (ft) 30 ft  Activity Response Tolerated fair  $Mobility charge 1 Mobility   Mobility Specialist Progress Note  During Mobility: 93% (RA) SpO2   Pt was in bed and agreeable. X1 seated break and mobility cut short d/t c/o knee pain. Did not need O2 while ambulating. Returned to bed with all needs met and call bell in reach.   Lucious Groves Mobility Specialist

## 2022-02-26 NOTE — Progress Notes (Signed)
At 0700 patient was NPO. At [phone] Dr. Elliot Gurney sent secure message to give postassium. One dose of potassium packet given. 1100 dose of K not given.

## 2022-02-26 NOTE — Progress Notes (Addendum)
Informed Endo that patient only received one dose of potassium not two. Also patient just had a nose bleed at 1027,

## 2022-02-26 NOTE — Transfer of Care (Signed)
Immediate Anesthesia Transfer of Care Note  Patient: Kim Anthony  Procedure(s) Performed: CARDIOVERSION  Patient Location: Endoscopy Unit  Anesthesia Type:General  Level of Consciousness: drowsy and patient cooperative  Airway & Oxygen Therapy: Patient Spontanous Breathing  Post-op Assessment: Report given to RN, Post -op Vital signs reviewed and stable and Patient moving all extremities X 4  Post vital signs: Reviewed and stable  Last Vitals:  Vitals Value Taken Time  BP    Temp    Pulse    Resp    SpO2      Last Pain:  Vitals:   02/26/22 1046  TempSrc:   PainSc: 3       Patients Stated Pain Goal: 3 (61/25/48 3234)  Complications: No notable events documented.

## 2022-02-26 NOTE — Progress Notes (Signed)
OT Cancellation Note  Patient Details Name: LILYMAE SWIECH [name]: [id number] DOB: 1950-07-10   Cancelled Treatment:    Reason Eval/Treat Not Completed: Patient at procedure or test/ unavailable (Patient adamently refused today stating she was tired and felt lazy. Patient stated she has surgery today and wants to rest.  Will attempt later today as schedule permits.) Lodema Hong, [place]  Office [place] 02/26/2022, 7:46 AM

## 2022-02-26 NOTE — Telephone Encounter (Signed)
Received a fax from [facility]. INR result 4.2; collected 02/23/22 @ 1640PM.

## 2022-02-26 NOTE — Anesthesia Postprocedure Evaluation (Signed)
Anesthesia Post Note  Patient: Kim Anthony  Procedure(s) Performed: CARDIOVERSION     Patient location during evaluation: Endoscopy Anesthesia Type: General Level of consciousness: awake and alert Pain management: pain level controlled Vital Signs Assessment: post-procedure vital signs reviewed and stable Respiratory status: spontaneous breathing, nonlabored ventilation and respiratory function stable Cardiovascular status: blood pressure returned to baseline and stable Postop Assessment: no apparent nausea or vomiting Anesthetic complications: no   No notable events documented.  Last Vitals:  Vitals:   02/26/22 1130 02/26/22 1140  BP: 111/84 122/65  Pulse: 78 77  Resp: 20 (!) 26  Temp:    SpO2: 96% 95%    Last Pain:  Vitals:   02/26/22 1140  TempSrc:   PainSc: 0-No pain                 Lidia Collum

## 2022-02-26 NOTE — CV Procedure (Signed)
Procedure:   DCCV  Indication:  Symptomatic atrial flutter  Procedure Note:  The patient signed informed consent.  They have had had therapeutic anticoagulation with warfarin greater than 3 weeks, INR 3.6.  Anesthesia was administered by Dr. Christella Hartigan and Leane Platt, CRNA.  Adequate airway was maintained throughout and vital followed per protocol.  They were cardioverted x 1 with 100J of biphasic synchronized energy.  They converted to NSR with rate 80s.  There were no apparent complications.  The patient had normal neuro status and respiratory status post procedure with vitals stable as recorded elsewhere.    Follow up:  They will continue on current medical therapy and follow up with cardiology as scheduled.  Oswaldo Milian, MD 02/26/2022 11:14 AM

## 2022-02-26 NOTE — TOC Progression Note (Signed)
Transition of Care [facility]) - Progression Note    Patient Details  Name: Kim Anthony [name]: [id number] Date of Birth: May 04, 1951  [facility] CM/SW Contact  Zenon Mayo, RN Phone Number: 02/26/2022, 2:33 PM  Clinical Narrative:    Pateint is for dc today, NCM awaiting to hear if patient will need home oxygen.    Expected Discharge Plan: Home/Self Care Barriers to Discharge: Continued Medical Work up  Expected Discharge Plan and Services Expected Discharge Plan: Home/Self Care In-house Referral: NA Discharge Planning Services: CM Consult Post Acute Care Choice: NA Living arrangements for the past 2 months: Single Family Home Expected Discharge Date: 02/26/22                 DME Agency: NA       HH Arranged: NA           Social Determinants of Health (SDOH) Interventions Food Insecurity Interventions: Inpatient TOC, Other (Comment) (gave the little green book and little blue book for free food pantries and resources) Housing Interventions: Inpatient TOC, Intervention Not Indicated  Readmission Risk Interventions    02/19/2022    3:36 PM  Readmission Risk Prevention Plan  Transportation Screening Complete  PCP or Specialist Appt within 3-5 Days Complete  HRI or [place] Complete  Social Work Consult for [place] Planning/Counseling Complete  Palliative Care Screening Not Applicable  Medication Review Press photographer) Complete

## 2022-02-26 NOTE — TOC Transition Note (Signed)
Transition of Care [facility]) - CM/SW Discharge Note   Patient Details  Name: Kim Anthony [name]: [id number] Date of Birth: 03-27-1951  Transition of Care [facility]) CM/SW Contact:  Zenon Mayo, RN Phone Number: 02/26/2022, 4:26 PM   Clinical Narrative:    Patient is for dc today, per pt/ot eval no pt/ot follow up needed.  Also this NCM was informed by mobility specialist, Kyla states that patient does not need home oxygen, she stayed in the 90's when she did ambulation check with her. She has transport home.      Barriers to Discharge: Continued Medical Work up   Patient Goals and CMS Choice Patient states their goals for this hospitalization and ongoing recovery are:: return home   Choice offered to / list presented to : NA  Discharge Placement                       Discharge Plan and Services In-house Referral: NA Discharge Planning Services: CM Consult Post Acute Care Choice: NA            DME Agency: NA       HH Arranged: NA          Social Determinants of Health (SDOH) Interventions Food Insecurity Interventions: Inpatient TOC, Other (Comment) (gave the little green book and little blue book for free food pantries and resources) Housing Interventions: Inpatient TOC, Intervention Not Indicated   Readmission Risk Interventions    02/19/2022    3:36 PM  Readmission Risk Prevention Plan  Transportation Screening Complete  PCP or Specialist Appt within 3-5 Days Complete  HRI or Home Care Consult Complete  Social Work Consult for [place] Planning/Counseling Complete  Palliative Care Screening Not Applicable  Medication Review Press photographer) Complete

## 2022-02-26 NOTE — Interval H&P Note (Signed)
History and Physical Interval Note:  02/26/2022 10:58 AM  Kim Anthony  has presented today for surgery, with the diagnosis of aflutter.  The various methods of treatment have been discussed with the patient and family. After consideration of risks, benefits and other options for treatment, the patient has consented to  Procedure(s): CARDIOVERSION (N/A) as a surgical intervention.  The patient's history has been reviewed, patient examined, no change in status, stable for surgery.  I have reviewed the patient's chart and labs.  Questions were answered to the patient's satisfaction.     Donato Heinz

## 2022-02-26 NOTE — Progress Notes (Signed)
PT Cancellation Note  Patient Details Name: Kim Anthony [name]: [id number] DOB: 1950/11/01   Cancelled Treatment:    Reason Eval/Treat Not Completed: Medical issues which prohibited therapy;Pain limiting ability to participate. PT attempted to see pt however she refuses mobility at this time due to R knee pain. Pt reports having arthritis in her knees. PT provides education on the need for mobility of arthritic joins to avoid stiffness and pain, however the pt continues to refuse PT at this time. PT will attempt to follow up as time allows.   Zenaida Niece 02/26/2022, 3:11 PM

## 2022-02-26 NOTE — Progress Notes (Signed)
Patient discharge summary was done by [facility] nurse but patient was not taken out by said nurse.

## 2022-02-26 NOTE — Anesthesia Preprocedure Evaluation (Signed)
Anesthesia Evaluation  Patient identified by [name], date of birth, ID band Patient awake    Reviewed: Allergy & Precautions, NPO status , Patient's Chart, lab work & pertinent test results  History of Anesthesia Complications Negative for: history of anesthetic complications  Airway Mallampati: II  TM Distance: >3 FB Neck ROM: Full    Dental   Pulmonary asthma , COPD, Current Smoker and Patient abstained from smoking., PE   Pulmonary exam normal        Cardiovascular hypertension, + CAD and +CHF  + dysrhythmias Atrial Fibrillation + Valvular Problems/Murmurs (s/p mechanical MVR)  Rhythm:Irregular Rate:Tachycardia  Echo 02/25/22: EF 35-40%, global hypokinesis, tilting disc valve present in the mitral position, normal structure and function of the MV prosthesis   Neuro/Psych negative neurological ROS     GI/Hepatic Neg liver ROS, GERD  ,  Endo/Other  negative endocrine ROS  Renal/GU Renal disease (Cr 1.47)  negative genitourinary   Musculoskeletal negative musculoskeletal ROS (+)   Abdominal   Peds  Hematology negative hematology ROS (+)   Anesthesia Other Findings   Reproductive/Obstetrics                             Anesthesia Physical Anesthesia Plan  ASA: 3  Anesthesia Plan: General   Post-op Pain Management:    Induction: Intravenous  PONV Risk Score and Plan: TIVA and Treatment may vary due to age or medical condition  Airway Management Planned: Mask  Additional Equipment: None  Intra-op Plan:   Post-operative Plan:   Informed Consent: I have reviewed the patients History and Physical, chart, labs and discussed the procedure including the risks, benefits and alternatives for the proposed anesthesia with the patient or authorized representative who has indicated his/her understanding and acceptance.       Plan Discussed with:   Anesthesia Plan Comments:          Anesthesia Quick Evaluation

## 2022-02-26 NOTE — Progress Notes (Addendum)
Rounding Note    Patient Name: Kim Anthony Date of Encounter: 02/26/2022  [place] Cardiologist: None   Subjective   Pt was seen bedside this AM. She denies any chest pain, shortness of breath, and dizziness. Explained purpose of cardioversion procedure scheduled for today and pt was agreeable.   Inpatient Medications    Scheduled Meds:  amiodarone  400 mg Oral BID   arformoterol  15 mcg Nebulization BID   And   umeclidinium bromide  1 puff Inhalation Daily   dapagliflozin propanediol  10 mg Oral Daily   fluticasone  2 spray Each Nare BID   hydrALAZINE  25 mg Oral TID   isosorbide mononitrate  30 mg Oral Daily   metoprolol succinate  25 mg Oral Daily   montelukast  10 mg Oral QHS   nicotine  7 mg Transdermal Daily   potassium chloride  40 mEq Oral BID   ramelteon  8 mg Oral QHS   simvastatin  20 mg Oral Daily   Warfarin - Pharmacist Dosing Inpatient   Does not apply q1600   Continuous Infusions:  PRN Meds: albuterol, dextromethorphan-guaiFENesin, guaiFENesin-dextromethorphan, HYDROcodone-acetaminophen, polyethylene glycol, simethicone, sodium chloride   Vital Signs    Vitals:   02/26/22 0335 02/26/22 0647 02/26/22 0719 02/26/22 0724  BP: 119/83  (!) 90/58   Pulse: 98  100   Resp: 15  16   Temp: 98 F (36.7 C)  97.8 F (36.6 C)   TempSrc: Oral     SpO2: 97%  91% 95%  Weight:  75.8 kg    Height:        Intake/Output Summary (Last 24 hours) at 02/26/2022 1010 Last data filed at 02/26/2022 0600 Gross per 24 hour  Intake 440 ml  Output 550 ml  Net -110 ml      02/26/2022    6:47 AM 02/24/2022    5:50 AM 02/23/2022    4:35 AM  Last 3 Weights  Weight (lbs) 167 lb 167 lb 169 lb 3.2 oz  Weight (kg) 75.751 kg 75.751 kg 76.749 kg      Telemetry    Atypical Atrial Flutter   ECG    No new ECG  Physical Exam   GEN: No acute distress.   Neck: No JVD Cardiac: Tachycardic,  no murmurs, rubs, or gallops.  Respiratory: Clear to auscultation  bilaterally. GI: Soft, nontender, non-distended  MS: No edema; No deformity. Neuro:  Nonfocal  Psych: Normal affect   Labs    High Sensitivity Troponin:   Recent Labs  Lab 02/04/22 0934 02/18/22 0217 02/18/22 0519  TROPONINIHS 15 18* 18*     Chemistry Recent Labs  Lab 02/24/22 0246 02/24/22 1216 02/25/22 0726 02/26/22 0332  NA 137 140 139 135  K 4.1 3.9 3.4* 3.0*  CL 102 102 103 100  CO2 '24 24 23 23  '$ GLUCOSE 92 88 95 103*  BUN 24* 25* 21 25*  CREATININE 1.13* 1.23* 1.11* 1.47*  CALCIUM 9.4 9.8 9.5 8.9  MG 2.0  --  1.9 2.0  GFRNONAA 52* 47* 53* 38*  ANIONGAP '11 14 13 12    '$ Lipids  Recent Labs  Lab 02/20/22 0531  CHOL 93  TRIG 110  HDL 27*  LDLCALC 44  CHOLHDL 3.4    Hematology Recent Labs  Lab 02/24/22 1216 02/25/22 0726 02/26/22 0332  WBC 7.6 7.7 7.7  RBC 4.43 4.47 4.15  HGB 13.4 13.1 12.2  HCT 41.6 42.5 39.6  MCV 93.9 95.1  95.4  MCH 30.2 29.3 29.4  MCHC 32.2 30.8 30.8  RDW 13.8 14.0 13.7  PLT 361 359 367   Thyroid No results for input(s): "TSH", "FREET4" in the last 168 hours.  BNPNo results for input(s): "BNP", "PROBNP" in the last 168 hours.  DDimer No results for input(s): "DDIMER" in the last 168 hours.   Radiology    US RENAL  Result Date: 02/26/2022 CLINICAL DATA:  Acute kidney injury EXAM: RENAL / URINARY TRACT ULTRASOUND COMPLETE COMPARISON:  02/18/2022 FINDINGS: Right Kidney: Renal measurements: 11.3 x 5.8 x 6.0 cm = volume: 202 mL. Echogenicity within normal limits. No mass or hydronephrosis visualized. Left Kidney: Renal measurements: 11.4 x 6.7 x 6.0 cm = volume: 171 mL. Echogenicity within normal limits. No mass or hydronephrosis visualized. Bladder: Evaluation limited due to under distension. Other: Minimal perihepatic ascites. IMPRESSION: 1. No significant sonographic abnormality of the kidneys. 2. Minimal perihepatic ascites. Electronically Signed   By: Miachel Roux M.D.   On: 02/26/2022 08:20   ECHO TEE  Result Date: 02/25/2022     TRANSESOPHOGEAL ECHO REPORT   Patient Name:   Kim Anthony Date of Exam: 02/25/2022 Medical Rec #:  [id number]     Height:       65.0 in Accession #:    [id number]    Weight:       165.3 lb Date of Birth:  08/13/1950      BSA:          1.824 m Patient Age:    71 years      BP:           107/77 mmHg Patient Gender: F             HR:           104 bpm. Exam Location:  Inpatient Procedure: Transesophageal Echo, Cardiac Doppler and Color Doppler Indications:    A flutter  History:        Patient has prior history of Echocardiogram examinations, most                 recent 02/18/2022. CHF, CAD, MV replacement - mechanical valve,                 COPD; Risk Factors:Hypertension and Dyslipidemia. Cancer.                  Mitral Valve: tilting disc valve valve is present in the mitral                 position.  Sonographer:    Eartha Inch Referring Phys: Lyman Bishop MD PROCEDURE: After discussion of the risks and benefits of a TEE, an informed consent was obtained from the patient. TEE procedure time was 2 minutes. The transesophogeal probe was passed without difficulty through the esophogus of the patient. Imaged were  obtained with the patient in a supine position. Sedation performed by different physician. The patient was monitored while under deep sedation. Anesthestetic sedation was provided intravenously by Anesthesiology: 178.'54mg'$  of Propofol. Image quality was adequate. The patient developed laryngospasm during the procedure. IMPRESSIONS  1. Left ventricular ejection fraction, by estimation, is 35 to 40%. The left ventricle has moderately decreased function. The left ventricle demonstrates global hypokinesis.  2. Right ventricular systolic function was not well visualized. The right ventricular size is not well visualized.  3. Left atrial size was severely dilated. No left atrial/left atrial appendage thrombus was detected.  4. The mitral valve has been  repaired/replaced. Trivial mitral valve regurgitation. not  able to assess mitral stenosis. There is a tilting disc valve present in the mitral position. Echo findings are consistent with normal structure and function of the mitral valve prosthesis.  5. The aortic valve was not assessed. Aortic valve regurgitation is not visualized. Conclusion(s)/Recommendation(s): Procedure was aborted after only a few images due to laryngospasm/airway issues. There was no LAA thrombus. Would proceed with DCCV tomorrow without the need for TEE when she is more stable. FINDINGS  Left Ventricle: Left ventricular ejection fraction, by estimation, is 35 to 40%. The left ventricle has moderately decreased function. The left ventricle demonstrates global hypokinesis. The left ventricular internal cavity size was normal in size. Right Ventricle: The right ventricular size is not well visualized. Right vetricular wall thickness was not assessed. Right ventricular systolic function was not well visualized. Left Atrium: Left atrial size was severely dilated. No left atrial/left atrial appendage thrombus was detected. Right Atrium: Right atrial size was not assessed. Pericardium: The pericardium was not assessed. Mitral Valve: The mitral valve has been repaired/replaced. Trivial mitral valve regurgitation. There is a tilting disc valve present in the mitral position. Echo findings are consistent with normal structure and function of the mitral valve prosthesis. Not able to assess mitral valve stenosis. Tricuspid Valve: The tricuspid valve is not assessed. Tricuspid valve regurgitation is not demonstrated. Aortic Valve: The aortic valve was not assessed. Aortic valve regurgitation is not visualized. Pulmonic Valve: The pulmonic valve was not assessed. Pulmonic valve regurgitation is not visualized. Aorta: Aortic root could not be assessed. IAS/Shunts: The interatrial septum was not assessed. Lyman Bishop MD Electronically signed by Lyman Bishop MD Signature Date/Time: 02/25/2022/2:57:38 PM    Final     DG CHEST PORT 1 VIEW  Result Date: 02/25/2022 CLINICAL DATA:  Dyspnea.  Status post avoided TEE EXAM: PORTABLE CHEST 1 VIEW COMPARISON:  02/18/2022 FINDINGS: The heart is enlarged but stable. Stable postoperative changes with median sternotomy wires prosthetic mitral valve. Chronic interstitial process. No infiltrates or effusions. No pneumothorax. IMPRESSION: Chronic cardiac enlargement and chronic interstitial process. No acute overlying cardiopulmonary findings. Electronically Signed   By: Marijo Sanes M.D.   On: 02/25/2022 13:42    Cardiac Studies   Echo 02/18/22 1. Left ventricular ejection fraction, by estimation, is 35 to 40%. The  left ventricle has moderately decreased function. The left ventricle  demonstrates global hypokinesis. Left ventricular diastolic function could  not be evaluated.   2. Right ventricular systolic function is severely reduced. The right  ventricular size is severely enlarged. There is normal pulmonary artery  systolic pressure. The estimated right ventricular systolic pressure is  09.3 mmHg.   3. Left atrial size was severely dilated.   4. Right atrial size was severely dilated.   5. Prosthetic disc motion appears normal. Gradients are probably  increased due to tachycardia. The mitral valve has been repaired/replaced.  No evidence of mitral valve regurgitation. No evidence of mitral stenosis.  The mean mitral valve gradient is 8.0  mmHg with average heart rate of 107 bpm. There is a mechanical valve  present in the mitral position.   6. The tricuspid valve is abnormal. Tricuspid valve regurgitation is  severe.   7. The aortic valve is tricuspid. Aortic valve regurgitation is not  visualized. No aortic stenosis is present.   8. The inferior vena cava is dilated in size with <50% respiratory  variability, suggesting right atrial pressure of 15 mmHg.   R/LHC 01/09/21  Mid RCA  lesion is 100% stenosed.   Ost Cx to Prox Cx lesion is 30% stenosed.   Dist  Cx lesion is 30% stenosed.   Dist LAD lesion is 40% stenosed.   Findings:   Ao = 157/85 (114) LV = 150/20 RA = 9 RV = 51/9 PA = 48/19 (32) PCW = 14 Fick cardiac output/index = 5.4/2.9 PVR = 3.3 WU FA sat = 95% PA sat = 73%, 69%   Assessment: 1v CAD with chronic occlusion of mRCA Relatively well-compensated filling pressures with mild pulmonary HTN Normal cardiac output   Difficult coronary ostia to engage due to tortuous subclavian artery. JL 3.5 use for left system LCB used for the RCA which had a high anterior takeoff  Patient Profile     71 y.o. female with chronic HFpEf with ejection fraction of 35-40%, mitral valve replacement on chronic coumadin, COPD, CAD, Hx of PE, who presented to the ED with SOB and LE Edema, found to be in chronic systolic HF exacerbation.   Assessment & Plan  #Acute on Chronic Systolic HF in the setting of Persistent Atrial Flutter #Severe RV Dysfunction  Pt has history of known systolic HF with LVEF 72-53% with global hypokinesis, severe RV dysfunction w/ severe enlargement, and severe TR. She has a history of MVR in 2001 for which she has been on warfarin, as well as atrial flutter with RVR.   Pt continues to be in atypical atrial flutter per telemetry. Pt underwent TEE/DCCV procedure yesterday as scheduled, however due to complication of severe laryngospasm during procedure, TEE/DCCV was aborted. Enough images were taken during procedure to see no LA Thrombus. Cardioversion is scheduled for today. Pt's potassium level is 3.0 currently, and has been refusing supplementation until this point, but we will continue to work with her so procedure can be done today.   Creatinine levels have increased from 1.47 from around 1.11 yesterday, and BUN has also jumped to 25, which could indicate that she is not volume overloaded anymore and is dry.   Plan:  - Hold torsemide until tomorrow  - Potassium supplementation  - Cardioversion today  - Continue medical  regimen of Farxiga '10mg'$ , Hydralazine '25mg'$ , Imdur '30mg'$ , Metoprolol '25mg'$ , and warfarin per pharmacy dosing     For questions or updates, please contact [place] Please consult [email or web address] for contact info under        Signed, Drucie Opitz, MD  02/26/2022, 10:10 AM    Patient seen and examined and agree with Drucie Opitz, MD as detailed above.   In brief, the patient is a 71 y.o. female  with a hx of mechanical MVR in 2001; CAD s/p CABG; history of breast cancer s/p L mastectomy, radiation, chemo; COPD with tobacco use; prior DVT/PE who presented with worsening SOB and LE edema found to have acute on chronic systolic HF exacerbation for which Cardiology was consulted. Course was complicated by atypical flutter for which she is scheduled for DCCV today.    Patient with known systolic HF with LVEF 66-44% with global hypokinesis, severe RV dysfunction with severe RV enlargement, mean mitral valve gradient 58mHg, severe TR. She also has history of MVR in 2001 for which she has been on warfarin. Presented on this admission with volume overload and aflutter with RVR. She responded well to diuresis and was transitioned to PO torsemide. Was planned for TEE/DCCV yesterday but developed laryngospasm, secretions and cough during TEE and therefore DCCV was postponed until today. She did not have evidence of LAA thrombus  on TEE yesterday.    Currently, the patient feels well. Breathing is back to normal. She is on RA. Cr up slightly from 1.1 to 1.47. Will hold torsemide for today.   GEN: Comfortable, NAD   Neck: No JVD Cardiac: Tachycardic, regular, +mechanical click  Respiratory: CTAB, no wheezes GI: Soft, nontender, non-distended  MS: No edema; No deformity. Neuro:  Nonfocal  Psych: Normal affect     Plan: - Plan for DCCV today - Patient amenable to taking PO potassium after discussion with her at bedside - Hold torsemide for today given mild AKI; likely resume tomorrow pending  renal function - Continue Toprol XL 25 mg daily. - Continue hydralazine 25 mg tid + Imdur 30 mg daily - Continue Farxiga 10 mg daily - Spiro held due to mild AKI; resume as able - On amiodarone for Aflutter; will continue amio load  - Warfarin dosing per pharmacy     Gwyndolyn Kaufman, MD

## 2022-02-27 ENCOUNTER — Telehealth: Payer: Self-pay

## 2022-02-27 NOTE — Patient Outreach (Signed)
  Care Coordination TOC Note Transition Care Management Follow-up Telephone Call Date of discharge and from where: [facility] 02[facility]22 How have you been since you were released from the hospital? "I am feeling pretty good". Any questions or concerns? No  Items Reviewed: Did the pt receive and understand the discharge instructions provided? Yes  Medications obtained and verified? Yes  Other? No  Any new allergies since your discharge? No  Dietary orders reviewed? Yes Do you have support at home? Yes   Home Care and Equipment/Supplies: Were home health services ordered? no If so, what is the name of the agency? N/A  Has the agency set up a time to come to the patient's home? not applicable Were any new equipment or medical supplies ordered?  No What is the name of the medical supply agency? N/A Were you able to get the supplies/equipment? not applicable Do you have any questions related to the use of the equipment or supplies? No  Functional Questionnaire: (I = Independent and D = Dependent) ADLs: I  Bathing/Dressing- I  Meal Prep- I  Eating- I  Maintaining continence- I  Transferring/Ambulation- I  Managing Meds- I  Follow up appointments reviewed:  PCP Hospital f/u appt confirmed? Yes  Scheduled to see Dr. Dorothea Ogle on 03/02/22 @ 0245. [facility] f/u appt confirmed? No   Are transportation arrangements needed? No  If their condition worsens, is the pt aware to call PCP or go to the Emergency Dept.? Yes Was the patient provided with contact information for the PCP's office or ED? Yes Was to pt encouraged to call back with questions or concerns? Yes  SDOH assessments and interventions completed:   Yes  Care Coordination Interventions Activated:  Yes   Care Coordination Interventions:   No further intervention needed     Encounter Outcome:  Pt. Visit Completed

## 2022-03-01 ENCOUNTER — Encounter (HOSPITAL_COMMUNITY): Payer: Self-pay | Admitting: Cardiology

## 2022-03-02 ENCOUNTER — Ambulatory Visit (HOSPITAL_COMMUNITY)
Admission: RE | Admit: 2022-03-02 | Disposition: A | Discharge status: Home / Self Care | Payer: Medicare Other | Source: Ambulatory Visit

## 2022-03-02 ENCOUNTER — Ambulatory Visit: Payer: Medicare Other | Admitting: Pharmacist

## 2022-03-02 ENCOUNTER — Ambulatory Visit (INDEPENDENT_AMBULATORY_CARE_PROVIDER_SITE_OTHER): Payer: Medicare Other | Admitting: Student

## 2022-03-02 VITALS — BP 101/63 | HR 75 | Temp 98.0°F | Ht 65.0 in | Wt 175.1 lb

## 2022-03-02 DIAGNOSIS — J449 Chronic obstructive pulmonary disease, unspecified: Secondary | ICD-10-CM

## 2022-03-02 DIAGNOSIS — I5042 Chronic combined systolic (congestive) and diastolic (congestive) heart failure: Secondary | ICD-10-CM

## 2022-03-02 DIAGNOSIS — G8929 Other chronic pain: Secondary | ICD-10-CM | POA: Diagnosis not present

## 2022-03-02 DIAGNOSIS — E785 Hyperlipidemia, unspecified: Secondary | ICD-10-CM

## 2022-03-02 DIAGNOSIS — F17210 Nicotine dependence, cigarettes, uncomplicated: Secondary | ICD-10-CM | POA: Diagnosis not present

## 2022-03-02 DIAGNOSIS — R9431 Abnormal electrocardiogram [ECG] [EKG]: Secondary | ICD-10-CM | POA: Insufficient documentation

## 2022-03-02 DIAGNOSIS — I2699 Other pulmonary embolism without acute cor pulmonale: Secondary | ICD-10-CM

## 2022-03-02 DIAGNOSIS — I484 Atypical atrial flutter: Secondary | ICD-10-CM | POA: Insufficient documentation

## 2022-03-02 DIAGNOSIS — I4469 Other fascicular block: Secondary | ICD-10-CM | POA: Insufficient documentation

## 2022-03-02 DIAGNOSIS — G894 Chronic pain syndrome: Secondary | ICD-10-CM

## 2022-03-02 DIAGNOSIS — K219 Gastro-esophageal reflux disease without esophagitis: Secondary | ICD-10-CM

## 2022-03-02 DIAGNOSIS — M545 Low back pain, unspecified: Secondary | ICD-10-CM | POA: Diagnosis not present

## 2022-03-02 DIAGNOSIS — Z7901 Long term (current) use of anticoagulants: Secondary | ICD-10-CM

## 2022-03-02 DIAGNOSIS — H9193 Unspecified hearing loss, bilateral: Secondary | ICD-10-CM

## 2022-03-02 DIAGNOSIS — T7840XA Allergy, unspecified, initial encounter: Secondary | ICD-10-CM

## 2022-03-02 DIAGNOSIS — E876 Hypokalemia: Secondary | ICD-10-CM

## 2022-03-02 LAB — POCT INR: INR: 4.6 — AB (ref 2.0–3.0)

## 2022-03-02 MED ORDER — TORSEMIDE 20 MG PO TABS: 20.0000 mg | ORAL_TABLET | Freq: Every day | ORAL | 2 refills | Status: DC

## 2022-03-02 MED ORDER — FAMOTIDINE 20 MG PO TABS: 20.0000 mg | ORAL_TABLET | Freq: Two times a day (BID) | ORAL | 0 refills | Status: DC

## 2022-03-02 MED ORDER — ALBUTEROL SULFATE HFA 108 (90 BASE) MCG/ACT IN AERS: 2.0000 | INHALATION_SPRAY | Freq: Four times a day (QID) | RESPIRATORY_TRACT | 2 refills | Status: DC | PRN

## 2022-03-02 MED ORDER — SIMVASTATIN 20 MG PO TABS: 20.0000 mg | ORAL_TABLET | Freq: Every day | ORAL | 2 refills | Status: DC

## 2022-03-02 MED ORDER — WARFARIN SODIUM 5 MG PO TABS: 5.0000 mg | ORAL_TABLET | Freq: Every day | ORAL | 0 refills | Status: DC

## 2022-03-02 MED ORDER — FLUTICASONE PROPIONATE 50 MCG/ACT NA SUSP: 1.0000 | Freq: Every day | NASAL | 2 refills | Status: DC | PRN

## 2022-03-02 MED ORDER — GABAPENTIN 600 MG PO TABS: 900.0000 mg | ORAL_TABLET | Freq: Three times a day (TID) | ORAL | 2 refills | Status: DC

## 2022-03-02 MED ORDER — ALBUTEROL SULFATE (2.5 MG/3ML) 0.083% IN NEBU: 2.5000 mg | INHALATION_SOLUTION | Freq: Four times a day (QID) | RESPIRATORY_TRACT | 2 refills | Status: DC | PRN

## 2022-03-02 MED ORDER — STIOLTO RESPIMAT 2.5-2.5 MCG/ACT IN AERS: INHALATION_SPRAY | RESPIRATORY_TRACT | 0 refills | Status: DC

## 2022-03-02 NOTE — Assessment & Plan Note (Signed)
This patient presents for hospital follow-up after admission for heart failure exacerbation secondary to a flutter.  She underwent successful cardioversion.  Since discharge she feels like she has been doing well.  Her symptoms are well controlled.  She denies shortness of breath, chest pain, orthopnea, PND, leg swelling, abdominal distention.  However she is having trouble adhering to her new medication regimen.  She is escorted to the clinic today by her daughter who is amenable to helping this patient organize and take her medications as prescribed.  Her blood pressure is 90/70 without orthostatic changes.  On exam she appears euvolemic.  Her lungs are clear and her work of breathing is normal.  This 71 year old female with HFrEF is doing well after admission for heart failure exacerbation secondary to atrial flutter treated with diuresis and cardioversion.  I am unable to confirm what this patient is taking of her prescribed medicines at home.  In setting of normotension bordering on hypotension, I recommend a simplified regimen of GDMT as outlined below until follow-up with cardiology on 03/13/2022: - Continue Farxiga 10 mg daily - Continue metoprolol succinate 25 mg daily - Continue spironolactone 25 mg daily - Continue torsemide 20 mg daily - Hold hydralazine and Imdur for now in favor of GDMT  ADDENDUM: Patient's creatinine is elevated to 2.01, nearly 2x baseline. Spoke to patient on the phone regarding these results. She is continuing to make a normal amount of urine. I instructed her to discontinue torsemide and return the clinic for a repeat BMP on 03/05/22.

## 2022-03-02 NOTE — Progress Notes (Signed)
Anticoagulation Management Kim Anthony is a 71 y.o. female who reports to the clinic for monitoring of warfarin treatment.    Indication: PE , history of; cardiac valve replacment; long term current use of oral anticoagulant , warfarin for target INR 2.5 - 3.5. Duration: indefinite Supervising physician: Gilles Chiquito  Anticoagulation Clinic Visit History: Patient does not report signs/symptoms of bleeding or thromboembolism  Other recent changes: No diet, medications, lifestyle except as noted in patient findings section.  Anticoagulation Episode Summary     Current INR goal:  2.5-3.5  TTR:  28.3 % (6 y)  Next INR check:  03/30/2022  INR from last check:  4.6 (03/02/2022)  Weekly max warfarin dose:    Target end date:    INR check location:  Anticoagulation Clinic  Preferred lab:    Send INR reminders to:     Indications   Pulmonary embolism (HCC) [I26.99]        Comments:           Allergies  Allergen Reactions   Lisinopril Swelling   Acetaminophen-Codeine Itching   Propoxyphene Itching    Darvocet   Tape Rash    Plastic   Tramadol Itching, Nausea And Vomiting and Rash    Current Outpatient Medications:    albuterol (VENTOLIN HFA) 108 (90 Base) MCG/ACT inhaler, Inhale 2 puffs into the lungs every 6 (six) hours as needed for wheezing or shortness of breath., Disp: 8 g, Rfl: 2   amiodarone (PACERONE) 200 MG tablet, Take 1 tablet (200 mg total) by mouth 2 (two) times daily for 7 days., Disp: 14 tablet, Rfl: 0   [START ON 03/07/2022] amiodarone (PACERONE) 200 MG tablet, Take 1 tablet (200 mg total) by mouth daily., Disp: 30 tablet, Rfl: 0   diphenhydrAMINE (BENADRYL) 25 MG tablet, Take 75 mg by mouth daily as needed for allergies., Disp: , Rfl:    famotidine (PEPCID) 20 MG tablet, Take 1 tablet (20 mg total) by mouth 2 (two) times daily., Disp: 60 tablet, Rfl: 0   FARXIGA 10 MG TABS tablet, Take 1 tablet by mouth once daily, Disp: 90 tablet, Rfl: 0   fluticasone  (FLONASE) 50 MCG/ACT nasal spray, Place 1 spray into both nostrils daily as needed for allergies., Disp: 9.9 mL, Rfl: 2   gabapentin (NEURONTIN) 600 MG tablet, Take 1.5 tablets (900 mg total) by mouth 3 (three) times daily. Take 1 and 1/2 tablets by mouth three times a day for nerve pain, Disp: 90 tablet, Rfl: 2   HYDROcodone-acetaminophen (NORCO) 5-325 MG tablet, Take 1 tablet by mouth every 4 (four) hours as needed for moderate pain., Disp: 150 tablet, Rfl: 0   metoprolol succinate (TOPROL-XL) 25 MG 24 hr tablet, Take 1 tablet (25 mg total) by mouth daily., Disp: 90 tablet, Rfl: 2   montelukast (SINGULAIR) 10 MG tablet, Take 1 tablet (10 mg total) by mouth at bedtime., Disp: 90 tablet, Rfl: 2   potassium chloride SA (KLOR-CON M) 20 MEQ tablet, Take 2 tablets (40 mEq total) by mouth daily. (Patient taking differently: Take 20 mEq by mouth daily.), Disp: 30 tablet, Rfl: 6   Simethicone (GAS-X PO), Take 1 tablet by mouth daily., Disp: , Rfl:    simvastatin (ZOCOR) 20 MG tablet, Take 1 tablet (20 mg total) by mouth daily., Disp: 90 tablet, Rfl: 2   Spacer/Aero-Holding Chambers (BREATHERITE COLL SPACER ADULT) MISC, 1 applicator by Does not apply route daily., Disp: 1 each, Rfl: 0   spironolactone (ALDACTONE) 25 MG tablet, Take  1 tablet by mouth once daily (Patient taking differently: Take 25 mg by mouth daily.), Disp: 90 tablet, Rfl: 3   Tiotropium Bromide-Olodaterol (STIOLTO RESPIMAT) 2.5-2.5 MCG/ACT AERS, Inhale 2 puffs by mouth daily, Disp: 4 g, Rfl: 0   torsemide (DEMADEX) 20 MG tablet, Take 1 tablet (20 mg total) by mouth daily., Disp: 30 tablet, Rfl: 2   warfarin (COUMADIN) 5 MG tablet, Take 1 tablet (5 mg total) by mouth daily at 4 PM for 30 doses., Disp: 30 tablet, Rfl: 0 Past Medical History:  Diagnosis Date   Arthritis    Asthma    Breast cancer ([place]) 2001   Left Breast Cancer   CHF (congestive heart failure) (HCC)    COPD (chronic obstructive pulmonary disease) (HCC)    Coronary artery  disease    GERD (gastroesophageal reflux disease)    History of mitral valve replacement with mechanical valve    HLD (hyperlipidemia)    Hypertension    Pulmonary embolism ([place]) 2021   Social History   Socioeconomic History   Marital status: Single    Spouse name: Not on file   Number of children: Not on file   Years of education: Not on file   Highest education level: Not on file  Occupational History   Not on file  Tobacco Use   Smoking status: Every Day    Packs/day: 0.10    Years: 30.00    Total pack years: 3.00    Types: Cigarettes   Smokeless tobacco: Never   Tobacco comments:    5 cigs per day  Vaping Use   Vaping Use: Never used  Substance and Sexual Activity   Alcohol use: Yes    Alcohol/week: 3.0 standard drinks of alcohol    Types: 3 Standard drinks or equivalent per week    Comment: beer- ocasional   Drug use: No   Sexual activity: Not Currently    Birth control/protection: None  Other Topics Concern   Not on file  Social History Narrative   ** Merged History Encounter **       Social Determinants of Health   Financial Resource Strain: Medium Risk (02/27/2021)   Overall Financial Resource Strain (CARDIA)    Difficulty of Paying Living Expenses: Somewhat hard  Food Insecurity: No Food Insecurity (02/24/2022)   Hunger Vital Sign    Worried About Running Out of Food in the Last Year: Never true    Ran Out of Food in the Last Year: Never true  Transportation Needs: No Transportation Needs (02/27/2022)   PRAPARE - Hydrologist (Medical): No    Lack of Transportation (Non-Medical): No  Physical Activity: Not on file  Stress: Not on file  Social Connections: Socially Isolated (11/28/2019)   Social Connection and Isolation Panel [NHANES]    Frequency of Communication with Friends and Family: More than three times a week    Frequency of Social Gatherings with Friends and Family: More than three times a week    Attends Religious  Services: Never    Marine scientist or Organizations: No    Attends Music therapist: Not on file    Marital Status: Never married   Family History  Problem Relation Age of Onset   Hypertension Mother    Cancer Father    Hypertension Father    Breast cancer Maternal Aunt 50   Heart attack Other     ASSESSMENT Recent Results: The most recent result is correlated with  35 mg per week: Lab Results  Component Value Date   INR 4.6 (A) 03/02/2022   INR 3.6 (H) 02/25/2022   INR 3.9 (H) 02/25/2022    Anticoagulation Dosing: Description   Take 1 tablet on Sundays, Tuesdays, Wednesdays, Fridays and Saturdays. Take only 1/2 tablet on Mondays and Thursdays      INR today: Supratherapeutic  PLAN Weekly dose was decreased by 14% to 30 mg per week  Patient Instructions  Patient instructed to take medications as defined in the Anti-coagulation Track section of this encounter.  Patient instructed to take today's dose. OMIT tomorrows dose on Tuesday March 03, 2022. Patient instructed to take  1 tablet on Sundays, Tuesdays, Wednesdays, Fridays and Saturdays. Take only 1/2 tablet on Mondays and Thursdays,  Patient verbalized understanding of these instructions.   Patient advised to contact clinic or seek medical attention if signs/symptoms of bleeding or thromboembolism occur.  Patient verbalized understanding by repeating back information and was advised to contact me if further medication-related questions arise. Patient was also provided an information handout.  Follow-up Return in 4 weeks (on 03/30/2022) for Follow up INR.  Pennie Banter, PharmD, CPP  15 minutes spent face-to-face with the patient during the encounter. 50% of time spent on education, including signs/sx bleeding and clotting, as well as food and drug interactions with warfarin. 50% of time was spent on fingerprick POC INR sample collection,processing, results determination, and documentation in  [email or web address]

## 2022-03-02 NOTE — Assessment & Plan Note (Signed)
Patient complains of chronic low back pain.  She also has pain in her knees.  She occasionally has pain in her hands.  She wakes up with some joint stiffness that lasts on the order of minutes, resolving by the time she goes to the restroom to wash up.  Denies fevers, incontinence, anesthesia.  Exam is notable for bilateral knee effusions but preserved strength in both legs.  Based on the description of this patient's symptoms with brief episodes of morning stiffness and distribution mainly to large joints in low back, leading suspicion is for a degenerative arthritis like osteoarthritis rather than an inflammatory arthritis.  Defer referral to rheumatology at this time, which patient was curious about.  I recommend staying active to keep stabilizing muscles strong.  I also encouraged the patient to make a dedicated follow-up appointment for evaluation of this complaint.

## 2022-03-02 NOTE — Assessment & Plan Note (Signed)
>>  ASSESSMENT AND PLAN FOR BIVENTRICULAR HEART FAILURE WITH REDUCED LEFT VENTRICULAR FUNCTION (HCC) WRITTEN ON 03/03/2022  5:42 PM BY MCLENDON, MICHAEL, MD  This patient presents for hospital follow-up after admission for heart failure exacerbation secondary to a flutter.  She underwent successful cardioversion.  Since discharge she feels like she has been doing well.  Her symptoms are well controlled.  She denies shortness of breath, chest pain, orthopnea, PND, leg swelling, abdominal distention.  However she is having trouble adhering to her new medication regimen.  She is escorted to the clinic today by her daughter who is amenable to helping this patient organize and take her medications as prescribed.  Her blood pressure is 90/70 without orthostatic changes.  On exam she appears euvolemic.  Her lungs are clear and her work of breathing is normal.  This 71 year old female with HFrEF is doing well after admission for heart failure exacerbation secondary to atrial flutter treated with diuresis and cardioversion.  I am unable to confirm what this patient is taking of her prescribed medicines at home.  In setting of normotension bordering on hypotension, I recommend a simplified regimen of GDMT as outlined below until follow-up with cardiology on 03/13/2022: - Continue Farxiga 10 mg daily - Continue metoprolol succinate 25 mg daily - Continue spironolactone 25 mg daily - Continue torsemide 20 mg daily - Hold hydralazine and Imdur for now in favor of GDMT  ADDENDUM: Patient's creatinine is elevated to 2.01, nearly 2x baseline. Spoke to patient on the phone regarding these results. She is continuing to make a normal amount of urine. I instructed her to discontinue torsemide and return the clinic for a repeat BMP on 03/05/22.

## 2022-03-02 NOTE — Assessment & Plan Note (Signed)
>>  ASSESSMENT AND PLAN FOR CHRONIC HFREF (HEART FAILURE WITH REDUCED EJECTION FRACTION) (HCC) WRITTEN ON 09/14/2023 11:43 AM BY Susumu Hackler, DO   >>ASSESSMENT AND PLAN FOR CHF (CONGESTIVE HEART FAILURE) (HCC) WRITTEN ON 09/14/2023 11:43 AM BY Arneisha Kincannon, DO   >>ASSESSMENT AND PLAN FOR BIVENTRICULAR HEART FAILURE WITH REDUCED LEFT VENTRICULAR FUNCTION (HCC) WRITTEN ON 03/03/2022  5:42 PM BY MCLENDON, MICHAEL, MD  This patient presents for hospital follow-up after admission for heart failure exacerbation secondary to a flutter.  She underwent successful cardioversion.  Since discharge she feels like she has been doing well.  Her symptoms are well controlled.  She denies shortness of breath, chest pain, orthopnea, PND, leg swelling, abdominal distention.  However she is having trouble adhering to her new medication regimen.  She is escorted to the clinic today by her daughter who is amenable to helping this patient organize and take her medications as prescribed.  Her blood pressure is 90/70 without orthostatic changes.  On exam she appears euvolemic.  Her lungs are clear and her work of breathing is normal.  This 71 year old female with HFrEF is doing well after admission for heart failure exacerbation secondary to atrial flutter treated with diuresis and cardioversion.  I am unable to confirm what this patient is taking of her prescribed medicines at home.  In setting of normotension bordering on hypotension, I recommend a simplified regimen of GDMT as outlined below until follow-up with cardiology on 03/13/2022: - Continue Farxiga 10 mg daily - Continue metoprolol succinate 25 mg daily - Continue spironolactone 25 mg daily - Continue torsemide 20 mg daily - Hold hydralazine and Imdur for now in favor of GDMT  ADDENDUM: Patient's creatinine is elevated to 2.01, nearly 2x baseline. Spoke to patient on the phone regarding these results. She is continuing to make a normal amount of urine. I  instructed her to discontinue torsemide and return the clinic for a repeat BMP on 03/05/22.

## 2022-03-02 NOTE — Progress Notes (Signed)
Subjective:  Reason for visit: Hospital follow-up for heart failure exacerbation secondary to atrial flutter  HPI:  [name] is a 71 y.o. female with a past medical history stated below and presents today for hospital follow-up. Please see problem based assessment and plan for additional details.  Past Medical History:  Diagnosis Date   Arthritis    Asthma    Breast cancer ([place]) 2001   Left Breast Cancer   CHF (congestive heart failure) (HCC)    COPD (chronic obstructive pulmonary disease) (HCC)    Coronary artery disease    GERD (gastroesophageal reflux disease)    History of mitral valve replacement with mechanical valve    HLD (hyperlipidemia)    Hypertension    Pulmonary embolism ([place]) 2021    Current Outpatient Medications on File Prior to Visit  Medication Sig Dispense Refill   amiodarone (PACERONE) 200 MG tablet Take 1 tablet (200 mg total) by mouth 2 (two) times daily for 7 days. 14 tablet 0   [START ON 03/07/2022] amiodarone (PACERONE) 200 MG tablet Take 1 tablet (200 mg total) by mouth daily. 30 tablet 0   diphenhydrAMINE (BENADRYL) 25 MG tablet Take 75 mg by mouth daily as needed for allergies.     FARXIGA 10 MG TABS tablet Take 1 tablet by mouth once daily 90 tablet 0   HYDROcodone-acetaminophen (NORCO) 5-325 MG tablet Take 1 tablet by mouth every 4 (four) hours as needed for moderate pain. 150 tablet 0   metoprolol succinate (TOPROL-XL) 25 MG 24 hr tablet Take 1 tablet (25 mg total) by mouth daily. 90 tablet 2   montelukast (SINGULAIR) 10 MG tablet Take 1 tablet (10 mg total) by mouth at bedtime. 90 tablet 2   potassium chloride SA (KLOR-CON M) 20 MEQ tablet Take 2 tablets (40 mEq total) by mouth daily. (Patient taking differently: Take 20 mEq by mouth daily.) 30 tablet 6   Simethicone (GAS-X PO) Take 1 tablet by mouth daily.     Spacer/Aero-Holding Chambers (BREATHERITE COLL SPACER ADULT) MISC 1 applicator by Does not apply route daily. 1 each 0    spironolactone (ALDACTONE) 25 MG tablet Take 1 tablet by mouth once daily (Patient taking differently: Take 25 mg by mouth daily.) 90 tablet 3   No current facility-administered medications on file prior to visit.    Family History  Problem Relation Age of Onset   Hypertension Mother    Cancer Father    Hypertension Father    Breast cancer Maternal Aunt 55   Heart attack Other     Social History   Socioeconomic History   Marital status: Single    Spouse name: Not on file   Number of children: Not on file   Years of education: Not on file   Highest education level: Not on file  Occupational History   Not on file  Tobacco Use   Smoking status: Every Day    Packs/day: 0.10    Years: 30.00    Total pack years: 3.00    Types: Cigarettes   Smokeless tobacco: Never   Tobacco comments:    5 cigs per day  Vaping Use   Vaping Use: Never used  Substance and Sexual Activity   Alcohol use: Yes    Alcohol/week: 3.0 standard drinks of alcohol    Types: 3 Standard drinks or equivalent per week    Comment: beer- ocasional   Drug use: No   Sexual activity: Not Currently  Birth control/protection: None  Other Topics Concern   Not on file  Social History Narrative   ** Merged History Encounter **       Social Determinants of Health   Financial Resource Strain: Medium Risk (02/27/2021)   Overall Financial Resource Strain (CARDIA)    Difficulty of Paying Living Expenses: Somewhat hard  Food Insecurity: No Food Insecurity (02/24/2022)   Hunger Vital Sign    Worried About Running Out of Food in the Last Year: Never true    Ran Out of Food in the Last Year: Never true  Transportation Needs: No Transportation Needs (02/27/2022)   PRAPARE - Hydrologist (Medical): No    Lack of Transportation (Non-Medical): No  Physical Activity: Not on file  Stress: Not on file  Social Connections: Socially Isolated (11/28/2019)   Social Connection and Isolation Panel  [NHANES]    Frequency of Communication with Friends and Family: More than three times a week    Frequency of Social Gatherings with Friends and Family: More than three times a week    Attends Religious Services: Never    Marine scientist or Organizations: No    Attends Music therapist: Not on file    Marital Status: Never married  Intimate Partner Violence: Not on file    Review of Systems: ROS negative except for what is noted on the assessment and plan.  Objective:   Vitals:   03/02/22 1449 03/02/22 1611 03/02/22 1612 03/02/22 1614  BP: '99/70 94/72 91/66 '$ 101/63  Pulse: 77 73 75 75  Temp: 98 F (36.7 C)     TempSrc: Oral     SpO2: 96%     Weight: 175 lb 1.6 oz (79.4 kg)     Height: '5\' 5"'$  (1.651 m)       Physical Exam: General: Alert female sitting in wheelchair Cardiovascular: No JVD.  1+ pitting edema halfway up bilateral legs.  Regular rate and rhythm.  Clicking artificial valve.  Radial pulses 2+ bilaterally. Pulmonary: Normal work of breathing.  Decreased breath sounds bilateral lung bases.  Otherwise clear throughout.  No wheezing.  No crackles. Abdomen: Soft.  Nontender.  Mildly distended. Skin: Warm and dry. Neurologic: Alert and oriented.  5/5 strength bilateral hip extension, knee extension (with more pain on ROM left knee), knee flexion, plantarflexion, dorsiflexion. Psychiatric: Pleasant.  Appropriate mood and affect.  Assessment & Plan:  Chronic combined systolic and diastolic heart failure [facility]) This patient presents for hospital follow-up after admission for heart failure exacerbation secondary to a flutter.  She underwent successful cardioversion.  Since discharge she feels like she has been doing well.  Her symptoms are well controlled.  She denies shortness of breath, chest pain, orthopnea, PND, leg swelling, abdominal distention.  However she is having trouble adhering to her new medication regimen.  She is escorted to the clinic today by  her daughter who is amenable to helping this patient organize and take her medications as prescribed.  Her blood pressure is 90/70 without orthostatic changes.  On exam she appears euvolemic.  Her lungs are clear and her work of breathing is normal.  This 71 year old female with HFrEF is doing well after admission for heart failure exacerbation secondary to atrial flutter treated with diuresis and cardioversion.  I am unable to confirm what this patient is taking of her prescribed medicines at home.  In setting of normotension bordering on hypotension, I recommend a simplified regimen of GDMT as outlined  below until follow-up with cardiology on 03/13/2022: - Continue Farxiga 10 mg daily - Continue metoprolol succinate 25 mg daily - Continue spironolactone 25 mg daily - Continue torsemide 20 mg daily - Hold hydralazine and Imdur for now in favor of GDMT  Atypical atrial flutter ([place]) Patient is asymptomatic today.  Her heart rate is regular.  EKG shows a normal sinus rhythm at a rate of approximately 70 bpm.  I recommend continuation of amiodarone as outlined below: - Continue amiodarone 200 mg twice daily through 03/06/2022 - Starting 03/07/2022 start amiodarone 200 mg once daily  COPD (chronic obstructive pulmonary disease) ([place]) Patient feeling well without shortness of breath since hospital discharge.  On exam lungs are without wheezing. - I refilled her Stiolto and albuterol per her request.  Chronic anticoagulation INR today is 4.6.  Patient's instructed to hold her dose of warfarin tomorrow.  Amiodarone and warfarin are known to have drug drug interactions which can increase patient's exposure to warfarin.  It is possible that she may need a dose adjustment given recent addition of amiodarone to her regimen.  Chronic low back pain Patient complains of chronic low back pain.  She also has pain in her knees.  She occasionally has pain in her hands.  She wakes up with some joint stiffness  that lasts on the order of minutes, resolving by the time she goes to the restroom to wash up.  Denies fevers, incontinence, anesthesia.  Exam is notable for bilateral knee effusions but preserved strength in both legs.  Based on the description of this patient's symptoms with brief episodes of morning stiffness and distribution mainly to large joints in low back, leading suspicion is for a degenerative arthritis like osteoarthritis rather than an inflammatory arthritis.  Defer referral to rheumatology at this time, which patient was curious about.  I recommend staying active to keep stabilizing muscles strong.  I also encouraged the patient to make a dedicated follow-up appointment for evaluation of this complaint.   Patient seen with Dr. Ala Dach, M.D. [facility]  PGY-1 Pager: [phone] Date 03/02/2022  Time 5:50 PM

## 2022-03-02 NOTE — Assessment & Plan Note (Signed)
INR today is 4.6.  Patient's instructed to hold her dose of warfarin tomorrow.  Amiodarone and warfarin are known to have drug drug interactions which can increase patient's exposure to warfarin.  It is possible that she may need a dose adjustment given recent addition of amiodarone to her regimen.

## 2022-03-02 NOTE — Assessment & Plan Note (Signed)
>>  ASSESSMENT AND PLAN FOR CHF (CONGESTIVE HEART FAILURE) (HCC) WRITTEN ON 09/14/2023 11:43 AM BY Carlyne Keehan, DO   >>ASSESSMENT AND PLAN FOR BIVENTRICULAR HEART FAILURE WITH REDUCED LEFT VENTRICULAR FUNCTION (HCC) WRITTEN ON 03/03/2022  5:42 PM BY MCLENDON, MICHAEL, MD  This patient presents for hospital follow-up after admission for heart failure exacerbation secondary to a flutter.  She underwent successful cardioversion.  Since discharge she feels like she has been doing well.  Her symptoms are well controlled.  She denies shortness of breath, chest pain, orthopnea, PND, leg swelling, abdominal distention.  However she is having trouble adhering to her new medication regimen.  She is escorted to the clinic today by her daughter who is amenable to helping this patient organize and take her medications as prescribed.  Her blood pressure is 90/70 without orthostatic changes.  On exam she appears euvolemic.  Her lungs are clear and her work of breathing is normal.  This 71 year old female with HFrEF is doing well after admission for heart failure exacerbation secondary to atrial flutter treated with diuresis and cardioversion.  I am unable to confirm what this patient is taking of her prescribed medicines at home.  In setting of normotension bordering on hypotension, I recommend a simplified regimen of GDMT as outlined below until follow-up with cardiology on 03/13/2022: - Continue Farxiga 10 mg daily - Continue metoprolol succinate 25 mg daily - Continue spironolactone 25 mg daily - Continue torsemide 20 mg daily - Hold hydralazine and Imdur for now in favor of GDMT  ADDENDUM: Patient's creatinine is elevated to 2.01, nearly 2x baseline. Spoke to patient on the phone regarding these results. She is continuing to make a normal amount of urine. I instructed her to discontinue torsemide and return the clinic for a repeat BMP on 03/05/22.

## 2022-03-02 NOTE — Assessment & Plan Note (Signed)
>>  ASSESSMENT AND PLAN FOR HFREF (HEART FAILURE WITH REDUCED EJECTION FRACTION) (HCC) WRITTEN ON 09/14/2023 11:44 AM BY Hughie Melroy, DO   >>ASSESSMENT AND PLAN FOR CHRONIC HFREF (HEART FAILURE WITH REDUCED EJECTION FRACTION) (HCC) WRITTEN ON 09/14/2023 11:43 AM BY Garvis Downum, DO   >>ASSESSMENT AND PLAN FOR CHF (CONGESTIVE HEART FAILURE) (HCC) WRITTEN ON 09/14/2023 11:43 AM BY Henok Heacock, DO   >>ASSESSMENT AND PLAN FOR BIVENTRICULAR HEART FAILURE WITH REDUCED LEFT VENTRICULAR FUNCTION (HCC) WRITTEN ON 03/03/2022  5:42 PM BY MCLENDON, MICHAEL, MD  This patient presents for hospital follow-up after admission for heart failure exacerbation secondary to a flutter.  She underwent successful cardioversion.  Since discharge she feels like she has been doing well.  Her symptoms are well controlled.  She denies shortness of breath, chest pain, orthopnea, PND, leg swelling, abdominal distention.  However she is having trouble adhering to her new medication regimen.  She is escorted to the clinic today by her daughter who is amenable to helping this patient organize and take her medications as prescribed.  Her blood pressure is 90/70 without orthostatic changes.  On exam she appears euvolemic.  Her lungs are clear and her work of breathing is normal.  This 71 year old female with HFrEF is doing well after admission for heart failure exacerbation secondary to atrial flutter treated with diuresis and cardioversion.  I am unable to confirm what this patient is taking of her prescribed medicines at home.  In setting of normotension bordering on hypotension, I recommend a simplified regimen of GDMT as outlined below until follow-up with cardiology on 03/13/2022: - Continue Farxiga 10 mg daily - Continue metoprolol succinate 25 mg daily - Continue spironolactone 25 mg daily - Continue torsemide 20 mg daily - Hold hydralazine and Imdur for now in favor of GDMT  ADDENDUM: Patient's creatinine is  elevated to 2.01, nearly 2x baseline. Spoke to patient on the phone regarding these results. She is continuing to make a normal amount of urine. I instructed her to discontinue torsemide and return the clinic for a repeat BMP on 03/05/22.

## 2022-03-02 NOTE — Patient Instructions (Signed)
Patient instructed to take medications as defined in the Anti-coagulation Track section of this encounter.  Patient instructed to take today's dose. OMIT tomorrows dose on Tuesday March 03, 2022. Patient instructed to take  1 tablet on Sundays, Tuesdays, Wednesdays, Fridays and Saturdays. Take only 1/2 tablet on Mondays and Thursdays,  Patient verbalized understanding of these instructions.

## 2022-03-02 NOTE — Patient Instructions (Addendum)
Thank you, Ms. Shaune Pascal for allowing [place] to provide your care today. Today we discussed heart medicines.    Until your cardiology appointment please be sure to take the following medicines: - Amiodarone twice per day until 03/07/2022.  Starting on 03/07/2022 take amiodarone once per day. - Metoprolol once per day - Farxiga once per day - Spironolactone once per day - Torsemide once per day  Stop taking the following medicines: - Lasix - Isosorbide mononitrate (Imdur) - Hydralazine  We will review these medications over the phone later this week.  I have ordered the following labs for you:  Lab Orders         BMP8+Anion Gap         POCT INR      Remember: Please call the clinic if you have any questions about your medication regimen or if you feel like you need a checkup.  We look forward to seeing you next time. Please call our clinic at [phone] if you have any questions or concerns. The best time to call is Monday-Friday from 9am-4pm, but there is someone available 24/7. If after hours or the weekend, call the main hospital number and ask for the Internal Medicine Resident On-Call. If you need medication refills, please notify your pharmacy one week in advance and they will send [place] a request.   Thank you for trusting me with your care. Wishing you the best!   Nani Gasser, MD [place]

## 2022-03-02 NOTE — Assessment & Plan Note (Signed)
Patient feeling well without shortness of breath since hospital discharge.  On exam lungs are without wheezing. - I refilled her Stiolto and albuterol per her request.

## 2022-03-02 NOTE — Assessment & Plan Note (Signed)
Patient is asymptomatic today.  Her heart rate is regular.  EKG shows a normal sinus rhythm at a rate of approximately 70 bpm.  I recommend continuation of amiodarone as outlined below: - Continue amiodarone 200 mg twice daily through 03/06/2022 - Starting 03/07/2022 start amiodarone 200 mg once daily

## 2022-03-02 NOTE — Progress Notes (Signed)
Anticoagulation Management Kim Anthony is a 71 y.o. female who reports to the clinic for monitoring of warfarin treatment.    Indication: PE, history of; long term current use of oral anticoagulant, warfarin.   Duration: indefinite Supervising physician: [facility] Visit History: Patient does not report signs/symptoms of bleeding or thromboembolism  Other recent changes: No diet, medications, lifestyle changes. Recent hospitalization for HF exacerbation.  Anticoagulation Episode Summary     Current INR goal:  2.5-3.5  TTR:  28.3 % (6 y)  Next INR check:  03/30/2022  INR from last check:  4.6 (03/02/2022)  Weekly max warfarin dose:    Target end date:    INR check location:  Anticoagulation Clinic  Preferred lab:    Send INR reminders to:     Indications   Pulmonary embolism (HCC) [I26.99]        Comments:           Allergies  Allergen Reactions   Lisinopril Swelling   Acetaminophen-Codeine Itching   Propoxyphene Itching    Darvocet   Tape Rash    Plastic   Tramadol Itching, Nausea And Vomiting and Rash    Current Outpatient Medications:    albuterol (VENTOLIN HFA) 108 (90 Base) MCG/ACT inhaler, Inhale 2 puffs into the lungs every 6 (six) hours as needed for wheezing or shortness of breath., Disp: 8 g, Rfl: 2   amiodarone (PACERONE) 200 MG tablet, Take 1 tablet (200 mg total) by mouth 2 (two) times daily for 7 days., Disp: 14 tablet, Rfl: 0   [START ON 03/07/2022] amiodarone (PACERONE) 200 MG tablet, Take 1 tablet (200 mg total) by mouth daily., Disp: 30 tablet, Rfl: 0   diphenhydrAMINE (BENADRYL) 25 MG tablet, Take 75 mg by mouth daily as needed for allergies., Disp: , Rfl:    famotidine (PEPCID) 20 MG tablet, Take 1 tablet (20 mg total) by mouth 2 (two) times daily., Disp: 60 tablet, Rfl: 0   FARXIGA 10 MG TABS tablet, Take 1 tablet by mouth once daily, Disp: 90 tablet, Rfl: 0   fluticasone (FLONASE) 50 MCG/ACT nasal spray, Place 1 spray  into both nostrils daily as needed for allergies., Disp: 9.9 mL, Rfl: 2   gabapentin (NEURONTIN) 600 MG tablet, Take 1.5 tablets (900 mg total) by mouth 3 (three) times daily. Take 1 and 1/2 tablets by mouth three times a day for nerve pain, Disp: 90 tablet, Rfl: 2   HYDROcodone-acetaminophen (NORCO) 5-325 MG tablet, Take 1 tablet by mouth every 4 (four) hours as needed for moderate pain., Disp: 150 tablet, Rfl: 0   metoprolol succinate (TOPROL-XL) 25 MG 24 hr tablet, Take 1 tablet (25 mg total) by mouth daily., Disp: 90 tablet, Rfl: 2   montelukast (SINGULAIR) 10 MG tablet, Take 1 tablet (10 mg total) by mouth at bedtime., Disp: 90 tablet, Rfl: 2   potassium chloride SA (KLOR-CON M) 20 MEQ tablet, Take 2 tablets (40 mEq total) by mouth daily. (Patient taking differently: Take 20 mEq by mouth daily.), Disp: 30 tablet, Rfl: 6   Simethicone (GAS-X PO), Take 1 tablet by mouth daily., Disp: , Rfl:    simvastatin (ZOCOR) 20 MG tablet, Take 1 tablet (20 mg total) by mouth daily., Disp: 90 tablet, Rfl: 2   Spacer/Aero-Holding Chambers (BREATHERITE COLL SPACER ADULT) MISC, 1 applicator by Does not apply route daily., Disp: 1 each, Rfl: 0   spironolactone (ALDACTONE) 25 MG tablet, Take 1 tablet by mouth once daily (Patient taking differently: Take  25 mg by mouth daily.), Disp: 90 tablet, Rfl: 3   Tiotropium Bromide-Olodaterol (STIOLTO RESPIMAT) 2.5-2.5 MCG/ACT AERS, Inhale 2 puffs by mouth daily, Disp: 4 g, Rfl: 0   torsemide (DEMADEX) 20 MG tablet, Take 1 tablet (20 mg total) by mouth daily., Disp: 30 tablet, Rfl: 2   warfarin (COUMADIN) 5 MG tablet, Take 1 tablet (5 mg total) by mouth daily at 4 PM for 30 doses., Disp: 30 tablet, Rfl: 0 Past Medical History:  Diagnosis Date   Arthritis    Asthma    Breast cancer ([place]) 2001   Left Breast Cancer   CHF (congestive heart failure) (HCC)    COPD (chronic obstructive pulmonary disease) (HCC)    Coronary artery disease    GERD (gastroesophageal reflux  disease)    History of mitral valve replacement with mechanical valve    HLD (hyperlipidemia)    Hypertension    Pulmonary embolism ([place]) 2021   Social History   Socioeconomic History   Marital status: Single    Spouse name: Not on file   Number of children: Not on file   Years of education: Not on file   Highest education level: Not on file  Occupational History   Not on file  Tobacco Use   Smoking status: Every Day    Packs/day: 0.10    Years: 30.00    Total pack years: 3.00    Types: Cigarettes   Smokeless tobacco: Never   Tobacco comments:    5 cigs per day  Vaping Use   Vaping Use: Never used  Substance and Sexual Activity   Alcohol use: Yes    Alcohol/week: 3.0 standard drinks of alcohol    Types: 3 Standard drinks or equivalent per week    Comment: beer- ocasional   Drug use: No   Sexual activity: Not Currently    Birth control/protection: None  Other Topics Concern   Not on file  Social History Narrative   ** Merged History Encounter **       Social Determinants of Health   Financial Resource Strain: Medium Risk (02/27/2021)   Overall Financial Resource Strain (CARDIA)    Difficulty of Paying Living Expenses: Somewhat hard  Food Insecurity: No Food Insecurity (02/24/2022)   Hunger Vital Sign    Worried About Running Out of Food in the Last Year: Never true    Ran Out of Food in the Last Year: Never true  Transportation Needs: No Transportation Needs (02/27/2022)   PRAPARE - Hydrologist (Medical): No    Lack of Transportation (Non-Medical): No  Physical Activity: Not on file  Stress: Not on file  Social Connections: Socially Isolated (11/28/2019)   Social Connection and Isolation Panel [NHANES]    Frequency of Communication with Friends and Family: More than three times a week    Frequency of Social Gatherings with Friends and Family: More than three times a week    Attends Religious Services: Never    Marine scientist or  Organizations: No    Attends Music therapist: Not on file    Marital Status: Never married   Family History  Problem Relation Age of Onset   Hypertension Mother    Cancer Father    Hypertension Father    Breast cancer Maternal Aunt 50   Heart attack Other     ASSESSMENT Recent Results: The most recent result is correlated with 35 mg per week: Lab Results  Component Value Date  INR 4.6 (A) 03/02/2022   INR 3.6 (H) 02/25/2022   INR 3.9 (H) 02/25/2022    Anticoagulation Dosing: Description   Take 1 tablet on Sundays, Tuesdays, Wednesdays, Fridays and Saturdays. Take only 1/2 tablet on Mondays and Thursdays      INR today: Supratherapeutic  PLAN Weekly dose was decreased by 14% to 30 mg per week  Patient Instructions  Patient instructed to take medications as defined in the Anti-coagulation Track section of this encounter.  Patient instructed to take today's dose. OMIT tomorrows dose on Tuesday March 03, 2022. Patient instructed to take  1 tablet on Sundays, Tuesdays, Wednesdays, Fridays and Saturdays. Take only 1/2 tablet on Mondays and Thursdays,  Patient verbalized understanding of these instructions.   Patient advised to contact clinic or seek medical attention if signs/symptoms of bleeding or thromboembolism occur.  Patient verbalized understanding by repeating back information and was advised to contact me if further medication-related questions arise. Patient was also provided an information handout.  Follow-up No follow-ups on file.  Kim Anthony Rolanda Campa  15 minutes spent face-to-face with the patient during the encounter. 50% of time spent on education, including signs/sx bleeding and clotting, as well as food and drug interactions with warfarin. 50% of time was spent on fingerprick POC INR sample collection,processing, results determination, and documentation in [email or web address]

## 2022-03-03 LAB — BMP8+ANION GAP
Anion Gap: 22 mmol/L — ABNORMAL HIGH (ref 10.0–18.0)
BUN/Creatinine Ratio: 17 (ref 12–28)
BUN: 35 mg/dL — ABNORMAL HIGH (ref 8–27)
CO2: 15 mmol/L — ABNORMAL LOW (ref 20–29)
Calcium: 9.1 mg/dL (ref 8.7–10.3)
Chloride: 95 mmol/L — ABNORMAL LOW (ref 96–106)
Creatinine, Ser: 2.01 mg/dL — ABNORMAL HIGH (ref 0.57–1.00)
Glucose: 134 mg/dL — ABNORMAL HIGH (ref 70–99)
Potassium: 4.5 mmol/L (ref 3.5–5.2)
Sodium: 132 mmol/L — ABNORMAL LOW (ref 134–144)
eGFR: 26 mL/min/{1.73_m2} — ABNORMAL LOW (ref >59)

## 2022-03-03 MED ORDER — MONTELUKAST SODIUM 10 MG PO TABS: 10.0000 mg | ORAL_TABLET | Freq: Every day | ORAL | 2 refills | Status: DC

## 2022-03-03 MED ORDER — WARFARIN SODIUM 5 MG PO TABS: 2.5000 mg | ORAL_TABLET | Freq: Every day | ORAL | 0 refills | Status: DC

## 2022-03-03 NOTE — Addendum Note (Signed)
Addended by: Nani Gasser on: 03/03/2022 06:15 PM   Modules accepted: Orders

## 2022-03-03 NOTE — Telephone Encounter (Signed)
Pt was seen the office on 9/11.

## 2022-03-03 NOTE — Addendum Note (Signed)
Addended by: Nani Gasser on: 03/03/2022 01:45 PM   Modules accepted: Orders

## 2022-03-06 ENCOUNTER — Ambulatory Visit: Payer: Self-pay

## 2022-03-06 NOTE — Progress Notes (Signed)
Internal Medicine Clinic Attending  I saw and evaluated the patient.  I personally confirmed the key portions of the history and exam documented by Dr. McLendon and I reviewed pertinent patient test results.  The assessment, diagnosis, and plan were formulated together and I agree with the documentation in the resident's note.  

## 2022-03-06 NOTE — Patient Outreach (Signed)
  Care Coordination   Follow Up Visit Note   03/06/2022 Name: Kim Anthony [name]: [id number] DOB: 09-28-1950  Kim Anthony is a 71 y.o. year old female who sees Iona Coach, MD for primary care. I spoke with  Shaune Pascal by phone today.  What matters to the patients health and wellness today?  Follow up call to patient after recent hospitalization and clinic visit.  Per patient, she is doing well with her medications and has a follow up at clinic on 03/30/22.     Goals Addressed   None     SDOH assessments and interventions completed:  Yes  SDOH Interventions Today    Flowsheet Row Most Recent Value  SDOH Interventions   Housing Interventions Intervention Not Indicated  Transportation Interventions Intervention Not Indicated        Care Coordination Interventions Activated:  Yes  Care Coordination Interventions:  Yes, provided   Follow up plan: Referral made to Lazaro Arms, RN Care Coordinator    Encounter Outcome:  Pt. Visit Completed

## 2022-03-07 ENCOUNTER — Encounter (HOSPITAL_COMMUNITY): Payer: Self-pay | Admitting: Emergency Medicine

## 2022-03-07 ENCOUNTER — Other Ambulatory Visit: Payer: Self-pay

## 2022-03-07 ENCOUNTER — Observation Stay (HOSPITAL_COMMUNITY)
Admission: EM | Admit: 2022-03-07 | Discharge: 2022-03-09 | Disposition: A | Discharge status: Home / Self Care | Payer: Medicare Other | Attending: Emergency Medicine | Admitting: Emergency Medicine

## 2022-03-07 ENCOUNTER — Emergency Department (HOSPITAL_COMMUNITY): Payer: Medicare Other

## 2022-03-07 DIAGNOSIS — I5042 Chronic combined systolic (congestive) and diastolic (congestive) heart failure: Secondary | ICD-10-CM | POA: Insufficient documentation

## 2022-03-07 DIAGNOSIS — R918 Other nonspecific abnormal finding of lung field: Secondary | ICD-10-CM | POA: Diagnosis not present

## 2022-03-07 DIAGNOSIS — I484 Atypical atrial flutter: Secondary | ICD-10-CM | POA: Diagnosis not present

## 2022-03-07 DIAGNOSIS — Z79899 Other long term (current) drug therapy: Secondary | ICD-10-CM | POA: Diagnosis not present

## 2022-03-07 DIAGNOSIS — I517 Cardiomegaly: Secondary | ICD-10-CM | POA: Diagnosis not present

## 2022-03-07 DIAGNOSIS — I50814 Right heart failure due to left heart failure: Secondary | ICD-10-CM | POA: Diagnosis present

## 2022-03-07 DIAGNOSIS — J069 Acute upper respiratory infection, unspecified: Secondary | ICD-10-CM | POA: Insufficient documentation

## 2022-03-07 DIAGNOSIS — I10 Essential (primary) hypertension: Secondary | ICD-10-CM | POA: Diagnosis present

## 2022-03-07 DIAGNOSIS — R1310 Dysphagia, unspecified: Secondary | ICD-10-CM | POA: Diagnosis not present

## 2022-03-07 DIAGNOSIS — I110 Hypertensive heart disease with heart failure: Secondary | ICD-10-CM | POA: Diagnosis not present

## 2022-03-07 DIAGNOSIS — R7989 Other specified abnormal findings of blood chemistry: Secondary | ICD-10-CM | POA: Diagnosis not present

## 2022-03-07 DIAGNOSIS — R2243 Localized swelling, mass and lump, lower limb, bilateral: Secondary | ICD-10-CM | POA: Diagnosis not present

## 2022-03-07 DIAGNOSIS — Z20822 Contact with and (suspected) exposure to covid-19: Secondary | ICD-10-CM | POA: Insufficient documentation

## 2022-03-07 DIAGNOSIS — Z7901 Long term (current) use of anticoagulants: Secondary | ICD-10-CM | POA: Diagnosis not present

## 2022-03-07 DIAGNOSIS — N179 Acute kidney failure, unspecified: Secondary | ICD-10-CM | POA: Diagnosis not present

## 2022-03-07 DIAGNOSIS — R220 Localized swelling, mass and lump, head: Secondary | ICD-10-CM | POA: Diagnosis not present

## 2022-03-07 DIAGNOSIS — R609 Edema, unspecified: Secondary | ICD-10-CM | POA: Diagnosis not present

## 2022-03-07 DIAGNOSIS — R600 Localized edema: Secondary | ICD-10-CM | POA: Diagnosis present

## 2022-03-07 DIAGNOSIS — J449 Chronic obstructive pulmonary disease, unspecified: Secondary | ICD-10-CM | POA: Insufficient documentation

## 2022-03-07 DIAGNOSIS — D75839 Thrombocytosis, unspecified: Secondary | ICD-10-CM | POA: Diagnosis not present

## 2022-03-07 DIAGNOSIS — I2510 Atherosclerotic heart disease of native coronary artery without angina pectoris: Secondary | ICD-10-CM | POA: Insufficient documentation

## 2022-03-07 DIAGNOSIS — J929 Pleural plaque without asbestos: Secondary | ICD-10-CM | POA: Diagnosis not present

## 2022-03-07 DIAGNOSIS — F17210 Nicotine dependence, cigarettes, uncomplicated: Secondary | ICD-10-CM | POA: Diagnosis not present

## 2022-03-07 DIAGNOSIS — Z952 Presence of prosthetic heart valve: Secondary | ICD-10-CM | POA: Diagnosis not present

## 2022-03-07 DIAGNOSIS — R9431 Abnormal electrocardiogram [ECG] [EKG]: Secondary | ICD-10-CM | POA: Diagnosis not present

## 2022-03-07 DIAGNOSIS — I5040 Unspecified combined systolic (congestive) and diastolic (congestive) heart failure: Secondary | ICD-10-CM | POA: Diagnosis present

## 2022-03-07 LAB — CBC WITH DIFFERENTIAL/PLATELET
Abs Immature Granulocytes: 0.04 10*3/uL (ref 0.00–0.07)
Basophils Absolute: 0.1 10*3/uL (ref 0.0–0.1)
Basophils Relative: 1 %
Eosinophils Absolute: 0.3 10*3/uL (ref 0.0–0.5)
Eosinophils Relative: 4 %
HCT: 40 % (ref 36.0–46.0)
Hemoglobin: 12.2 g/dL (ref 12.0–15.0)
Immature Granulocytes: 1 %
Lymphocytes Relative: 24 %
Lymphs Abs: 1.6 10*3/uL (ref 0.7–4.0)
MCH: 29.2 pg (ref 26.0–34.0)
MCHC: 30.5 g/dL (ref 30.0–36.0)
MCV: 95.7 fL (ref 80.0–100.0)
Monocytes Absolute: 0.7 10*3/uL (ref 0.1–1.0)
Monocytes Relative: 11 %
Neutro Abs: 3.9 10*3/uL (ref 1.7–7.7)
Neutrophils Relative %: 59 %
Platelets: 507 10*3/uL — ABNORMAL HIGH (ref 150–400)
RBC: 4.18 MIL/uL (ref 3.87–5.11)
RDW: 13.7 % (ref 11.5–15.5)
WBC: 6.6 10*3/uL (ref 4.0–10.5)
nRBC: 0 % (ref 0.0–0.2)

## 2022-03-07 LAB — COMPREHENSIVE METABOLIC PANEL
ALT: 18 U/L (ref 0–44)
AST: 35 U/L (ref 15–41)
Albumin: 3.2 g/dL — ABNORMAL LOW (ref 3.5–5.0)
Alkaline Phosphatase: 199 U/L — ABNORMAL HIGH (ref 38–126)
Anion gap: 11 (ref 5–15)
BUN: 17 mg/dL (ref 8–23)
CO2: 23 mmol/L (ref 22–32)
Calcium: 9 mg/dL (ref 8.9–10.3)
Chloride: 100 mmol/L (ref 98–111)
Creatinine, Ser: 1.22 mg/dL — ABNORMAL HIGH (ref 0.44–1.00)
GFR, Estimated: 47 mL/min — ABNORMAL LOW (ref >60)
Glucose, Bld: 93 mg/dL (ref 70–99)
Potassium: 4.1 mmol/L (ref 3.5–5.1)
Sodium: 134 mmol/L — ABNORMAL LOW (ref 135–145)
Total Bilirubin: 1.5 mg/dL — ABNORMAL HIGH (ref 0.3–1.2)
Total Protein: 7.8 g/dL (ref 6.5–8.1)

## 2022-03-07 LAB — SARS CORONAVIRUS 2 BY RT PCR: SARS Coronavirus 2 by RT PCR: NEGATIVE

## 2022-03-07 LAB — LACTIC ACID, PLASMA
Lactic Acid, Venous: 2.2 mmol/L (ref 0.5–1.9)
Lactic Acid, Venous: 2.1 mmol/L (ref 0.5–1.9)

## 2022-03-07 MED ORDER — UMECLIDINIUM-VILANTEROL 62.5-25 MCG/ACT IN AEPB
1.0000 | INHALATION_SPRAY | Freq: Every day | RESPIRATORY_TRACT | Status: DC
  Administered 2022-03-08 – 2022-03-09 (×2): 1 via RESPIRATORY_TRACT
  Filled 2022-03-07: qty 14

## 2022-03-07 MED ORDER — HYDROCODONE-ACETAMINOPHEN 5-325 MG PO TABS
1.0000 | ORAL_TABLET | Freq: Four times a day (QID) | ORAL | Status: DC | PRN
  Administered 2022-03-08 (×3): 1 via ORAL
  Filled 2022-03-07 (×3): qty 1

## 2022-03-07 MED ORDER — TORSEMIDE 20 MG PO TABS
20.0000 mg | ORAL_TABLET | Freq: Every day | ORAL | Status: DC
  Administered 2022-03-08 – 2022-03-09 (×3): 20 mg via ORAL
  Filled 2022-03-07 (×3): qty 1

## 2022-03-07 MED ORDER — ENOXAPARIN SODIUM 40 MG/0.4ML IJ SOSY: 40.0000 mg | PREFILLED_SYRINGE | INTRAMUSCULAR | Status: DC

## 2022-03-07 MED ORDER — MONTELUKAST SODIUM 10 MG PO TABS
10.0000 mg | ORAL_TABLET | Freq: Every day | ORAL | Status: DC
  Administered 2022-03-08 (×2): 10 mg via ORAL
  Filled 2022-03-07 (×2): qty 1

## 2022-03-07 MED ORDER — SIMVASTATIN 20 MG PO TABS
20.0000 mg | ORAL_TABLET | Freq: Every day | ORAL | Status: DC
  Administered 2022-03-08: 20 mg via ORAL
  Filled 2022-03-07: qty 1

## 2022-03-07 MED ORDER — METOPROLOL SUCCINATE ER 25 MG PO TB24
25.0000 mg | ORAL_TABLET | Freq: Every day | ORAL | Status: DC
  Administered 2022-03-08 – 2022-03-09 (×2): 25 mg via ORAL
  Filled 2022-03-07 (×2): qty 1

## 2022-03-07 MED ORDER — SPIRONOLACTONE 25 MG PO TABS
25.0000 mg | ORAL_TABLET | Freq: Every day | ORAL | Status: DC
  Administered 2022-03-08 – 2022-03-09 (×3): 25 mg via ORAL
  Filled 2022-03-07 (×3): qty 1

## 2022-03-07 MED ORDER — ALBUTEROL SULFATE (2.5 MG/3ML) 0.083% IN NEBU: 3.0000 mL | INHALATION_SOLUTION | Freq: Four times a day (QID) | RESPIRATORY_TRACT | Status: DC | PRN

## 2022-03-07 MED ORDER — DAPAGLIFLOZIN PROPANEDIOL 10 MG PO TABS
10.0000 mg | ORAL_TABLET | Freq: Every day | ORAL | Status: DC
  Administered 2022-03-08 – 2022-03-09 (×3): 10 mg via ORAL
  Filled 2022-03-07 (×3): qty 1

## 2022-03-07 NOTE — ED Provider Notes (Signed)
[place] EMERGENCY DEPARTMENT Provider Note   CSN: [id number] Arrival date & time: 03/07/22  1131     History  Chief Complaint  Patient presents with   Facial Swelling    Kim Anthony is a 71 y.o. female.  HPI   This patient is a 71 year old female who has recently been started on amiodarone.  This patient was recently admitted to the hospital where she had had fluid overload, pulmonary edema and congestive heart failure exacerbation and during that hospitalization had medication management and electrocardioversion.  She is on chronic Coumadin therapy because of blood clots, she presented in atrial flutter with rapid rate for which she had been admitted to the hospital and spent 1 week.  She was discharged after successful cardioversion on February 26, 2022.  She followed up with her internal medicine physician on September 11.  The patient reports that yesterday she started to feel like she was having some swelling of her periorbital tissue, today she felt the swelling was much worse in her face and started to have a feeling like her throat was swelling as well.  She denies having fevers or chills and is not short of breath, she denies any significant swelling of her legs.  She has no itching or rashes.  The only new medication that she has been taking is amiodarone as well as montelukast.  Home Medications Prior to Admission medications   Medication Sig Start Date End Date Taking? Authorizing Provider  albuterol (VENTOLIN HFA) 108 (90 Base) MCG/ACT inhaler Inhale 2 puffs into the lungs every 6 (six) hours as needed for wheezing or shortness of breath. 03/02/22   Nani Gasser, MD  amiodarone (PACERONE) 200 MG tablet Take 1 tablet (200 mg total) by mouth 2 (two) times daily for 7 days. 02/28/22 03/07/22  Delene Ruffini, MD  amiodarone (PACERONE) 200 MG tablet Take 1 tablet (200 mg total) by mouth daily. 03/07/22 04/06/22  Delene Ruffini, MD  diphenhydrAMINE  (BENADRYL) 25 MG tablet Take 75 mg by mouth daily as needed for allergies.    [provider]  famotidine (PEPCID) 20 MG tablet Take 1 tablet (20 mg total) by mouth 2 (two) times daily. 03/02/22   Nani Gasser, MD  FARXIGA 10 MG TABS tablet Take 1 tablet by mouth once daily 02/09/22   Idamae Schuller, MD  fluticasone [facility]) 50 MCG/ACT nasal spray Place 1 spray into both nostrils daily as needed for allergies. 03/02/22 03/02/23  Nani Gasser, MD  gabapentin (NEURONTIN) 600 MG tablet Take 1.5 tablets (900 mg total) by mouth 3 (three) times daily. Take 1 and 1/2 tablets by mouth three times a day for nerve pain 03/02/22   Nani Gasser, MD  HYDROcodone-acetaminophen (NORCO) 5-325 MG tablet Take 1 tablet by mouth every 4 (four) hours as needed for moderate pain. 02/25/22   Lovorn, Jinny Blossom, MD  metoprolol succinate (TOPROL-XL) 25 MG 24 hr tablet Take 1 tablet (25 mg total) by mouth daily. 06/06/21   Angelica Pou, MD  montelukast (SINGULAIR) 10 MG tablet Take 1 tablet (10 mg total) by mouth at bedtime. 03/03/22   Nani Gasser, MD  Simethicone (GAS-X PO) Take 1 tablet by mouth daily.    [provider]  simvastatin (ZOCOR) 20 MG tablet Take 1 tablet (20 mg total) by mouth daily. 03/02/22   Nani Gasser, MD  Spacer/Aero-Holding Chambers (BREATHERITE COLL SPACER ADULT) MISC 1 applicator by Does not apply route daily. 01/11/20   [place], Nila Nephew D, DO  spironolactone (ALDACTONE)  25 MG tablet Take 1 tablet by mouth once daily Patient taking differently: Take 25 mg by mouth daily. 11/19/21   Bensimhon, Shaune Pascal, MD  Tiotropium Bromide-Olodaterol (STIOLTO RESPIMAT) 2.5-2.5 MCG/ACT AERS Inhale 2 puffs by mouth daily 03/02/22   Nani Gasser, MD  torsemide (DEMADEX) 20 MG tablet Take 1 tablet (20 mg total) by mouth daily. 03/02/22 05/31/22  Nani Gasser, MD  warfarin (COUMADIN) 5 MG tablet Take 0.5 tablets (2.5 mg total) by mouth daily at 4 PM for 30 doses. 03/03/22  04/02/22  Nani Gasser, MD      Allergies    Lisinopril, Acetaminophen-codeine, Propoxyphene, Tape, and Tramadol    Review of Systems   Review of Systems  All other systems reviewed and are negative.   Physical Exam Updated Vital Signs BP 128/78   Pulse (!) 58   Temp 97.7 F (36.5 C) (Oral)   Resp 14   SpO2 98%  Physical Exam Vitals and nursing note reviewed.  Constitutional:      General: She is not in acute distress.    Appearance: She is well-developed.  HENT:     Head: Normocephalic and atraumatic.     Comments: Periorbital edema and mild edema of the face in general    Mouth/Throat:     Pharynx: No oropharyngeal exudate.     Comments: The oropharynx is clear, phonation is normal, the neck is very supple, there is no trismus or torticollis, there is no signs of thrush Eyes:     General: No scleral icterus.       Right eye: No discharge.        Left eye: No discharge.     Conjunctiva/sclera: Conjunctivae normal.     Pupils: Pupils are equal, round, and reactive to light.  Neck:     Thyroid: No thyromegaly.     Vascular: No JVD.  Cardiovascular:     Rate and Rhythm: Normal rate and regular rhythm.     Heart sounds: Normal heart sounds. No murmur heard.    No friction rub. No gallop.  Pulmonary:     Effort: Pulmonary effort is normal. No respiratory distress.     Breath sounds: Rales present. No wheezing.     Comments: The patient is in no distress, oxygen of 98%, respiratory rate is normal at 14 to 16 breaths/min but there is subtle rales at the bases bilaterally Abdominal:     General: Bowel sounds are normal. There is no distension.     Palpations: Abdomen is soft. There is no mass.     Tenderness: There is no abdominal tenderness.  Musculoskeletal:        General: No tenderness. Normal range of motion.     Cervical back: Normal range of motion and neck supple.     Right lower leg: Edema present.     Left lower leg: Edema present.     Comments: Scant  symmetrical bilateral lower extremity pitting edema  Lymphadenopathy:     Cervical: No cervical adenopathy.  Skin:    General: Skin is warm and dry.     Findings: No erythema or rash.  Neurological:     Mental Status: She is alert.     Coordination: Coordination normal.  Psychiatric:        Behavior: Behavior normal.     ED Results / Procedures / Treatments   Labs (all labs ordered are listed, but only abnormal results are displayed) Labs Reviewed  LACTIC ACID, PLASMA  LACTIC ACID, PLASMA  COMPREHENSIVE METABOLIC PANEL  CBC WITH DIFFERENTIAL/PLATELET  BRAIN NATRIURETIC PEPTIDE    EKG EKG Interpretation  Date/Time:  Saturday March 07 2022 19:27:31 EDT Ventricular Rate:  60 PR Interval:  172 QRS Duration: 108 QT Interval:  410 QTC Calculation: 410 R Axis:   -50 Text Interpretation: Normal sinus rhythm Left axis deviation Incomplete right bundle branch block Cannot rule out Anterior infarct , age undetermined Abnormal ECG When compared with ECG of 07-Mar-2022 12:20, PREVIOUS ECG IS PRESENT QT is shorter than prior Confirmed by Noemi Chapel [phone]) on 03/07/2022 7:36:44 PM  Radiology No results found.  Procedures Procedures    Medications Ordered in ED Medications - No data to display  ED Course/ Medical Decision Making/ A&P                           Medical Decision Making Amount and/or Complexity of Data Reviewed Labs: ordered. Radiology: ordered. ECG/medicine tests: ordered.  Risk Decision regarding hospitalization.   This patient presents to the ED for concern of swelling of the face, this involves an extensive number of treatment options, and is a complaint that carries with it a high risk of complications and morbidity.  The differential diagnosis includes allergic reaction, SVC syndrome, congestive heart failure, fluid overload   Co morbidities that complicate the patient evaluation  Congestive heart failure   Additional history  obtained:  Additional history obtained from electronic medical record and prior hospitalization External records from outside source obtained and reviewed including echocardiogram, this was performed February 25, 2022 and was a TEE showing ejection fraction of 35 to 40%, severely dilated left atrium, no thrombus detected, however there was laryngospasm and airway issues during the procedure showed the procedure was aborted they proceeded with cardioversion   Lab Tests:  I Ordered, and personally interpreted labs.  The pertinent results include:  unremarkable.   Imaging Studies ordered:  I ordered imaging studies including chest x-ray I independently visualized and interpreted imaging which showed cardiomegaly and some interstitial disease, no significant changes I agree with the radiologist interpretation   Cardiac Monitoring: / EKG:  The patient was maintained on a cardiac monitor.  I personally viewed and interpreted the cardiac monitored which showed an underlying rhythm of: normal sinus rhythm   Consultations Obtained:  I requested consultation with the IM resident admitting team.,  and discussed lab and imaging findings as well as pertinent plan - they recommend: admit to hospital   Problem List / ED Course / Critical interventions / Medication management  Swelling - possible allergy / side effect - no histaimine related. Reevaluation of the patient after observation showed that the patient continued to have a slight change in voice but no hypoxia I have reviewed the patients home medicines and have made adjustments as needed   Social Determinants of Health:  Chronic congestive heart failure   Test / Admission - Considered:  We will admit to the hospital due to the patient's swelling of the throat or subjective feeling of throat swelling though objectively the patient is not hypoxic.  No arrhythmias seen QT prolongation on repeat EKG had improved        Final  Clinical Impression(s) / ED Diagnoses Final diagnoses:  Facial swelling     Noemi Chapel, MD 03/07/22 2008

## 2022-03-07 NOTE — ED Notes (Signed)
IV team attempted IV once another team member to attempt MD aware

## 2022-03-07 NOTE — H&P (Incomplete)
Date: 03/07/2022               Patient Name:  Kim Anthony [name]: [id number]  DOB: [birth year] Age / Sex: 71 y.o., female   PCP: Iona Coach, MD         Medical Service: Internal Medicine Teaching Service         Attending Physician: : Dr. Evette Doffing    First Contact: Delene Ruffini, MD      Pager: [phone]      Second Contact: Iona Beard, MD      Pager: Governor Rooks [phone]           After Hours (After 5p/  First Contact Pager: [phone]  weekends / holidays): Second Contact Pager: [phone]   SUBJECTIVE   Chief Complaint: Eye swelling  History of Present Illness:   Kim Anthony is a 71 yo F with a pertinent medical history of COPD, CAD, chronic combined systolic and diastolic HF, atypical atrial flutter, mechanical MV on chronic warfarin therapy, recently discharged to the hospital for CHF exacerbation 2/2 atrial flutter s/p cardioversion and discharged on amiodarone presenting to the ED for facial edema  Patient has recent hospital for heart failure exacerbation in the setting of atrial flutter that was treated with diuresis and cardioversion on 02/26/2022 and discharged on Amiodarone therapy. Patient was recently seen in [facility] clinic on 9/11. Patient was normotensive, HR70, and nsr. INR was supratherapeutic to 4.6. and patient was told to hold her warfarin dose on 9/12, which she did. Patient was to continue amiodarone 200 mg BID until yesterday, 9/15, and switch to once daily therapy today. She was also continued on the following medications for her HF: Farxiga 22m daily, metoprolol succinate 26 mg, spironolactone 25 mg fdaily, and torsemide.   Patient and daughter report Kim Anthony been compliant with all of her inhaled and prescribed medications. Facial swelling started ~ two days ago, mostly pronounced around her eyes. She felt like something was stuck in the back of her throat in the am, but this has since cleared and patient has been eating and swallowing without  problems since she arrived at the hospital. She denies itchiness, pain, flushing, changes in sweating or temperature of her face, changes in vision, or headaches.   Patient did not take oral medications this AM. She reports she had a COVID exposure 5 days ago, and other than a mild nasal congestion, she denies chest tightness, fevers, myalgias, shortness of breath, chest pain, increased swelling in her lower extremities, new cough, or sputum production. Patient does note she has been having cold intolerance and increased sleepiness during the day. She do   ED Course: Patient with stable VS, and QT prolongation on EKG and CXR without pulmonary edema or consolidations. Patient's home Amiodarone was held and cardiology consult was placed.  Meds:  Current Meds  Medication Sig   albuterol (VENTOLIN HFA) 108 (90 Base) MCG/ACT inhaler Inhale 2 puffs into the lungs every 6 (six) hours as needed for wheezing or shortness of breath.   amiodarone (PACERONE) 200 MG tablet Take 1 tablet (200 mg total) by mouth daily.   diphenhydrAMINE (BENADRYL) 25 MG tablet Take 75 mg by mouth daily as needed for allergies.   famotidine (PEPCID) 20 MG tablet Take 1 tablet (20 mg total) by mouth 2 (two) times daily.   FARXIGA 10 MG TABS tablet Take 1 tablet by mouth once daily   fluticasone (FLONASE) 50 MCG/ACT nasal spray Place 1 spray into both nostrils  daily as needed for allergies.   gabapentin (NEURONTIN) 600 MG tablet Take 1.5 tablets (900 mg total) by mouth 3 (three) times daily. Take 1 and 1/2 tablets by mouth three times a day for nerve pain   HYDROcodone-acetaminophen (NORCO) 5-325 MG tablet Take 1 tablet by mouth every 4 (four) hours as needed for moderate pain.   metoprolol succinate (TOPROL-XL) 25 MG 24 hr tablet Take 1 tablet (25 mg total) by mouth daily.   montelukast (SINGULAIR) 10 MG tablet Take 1 tablet (10 mg total) by mouth at bedtime.   Simethicone (GAS-X PO) Take 1 tablet by mouth daily.   simvastatin  (ZOCOR) 20 MG tablet Take 1 tablet (20 mg total) by mouth daily.   spironolactone (ALDACTONE) 25 MG tablet Take 1 tablet by mouth once daily (Patient taking differently: Take 25 mg by mouth daily.)   Tiotropium Bromide-Olodaterol (STIOLTO RESPIMAT) 2.5-2.5 MCG/ACT AERS Inhale 2 puffs by mouth daily   warfarin (COUMADIN) 5 MG tablet Take 0.5 tablets (2.5 mg total) by mouth daily at 4 PM for 30 doses.    Past Medical History  Past Surgical History:  Procedure Laterality Date   APPLICATION OF A-CELL OF CHEST/ABDOMEN Left 01/27/2019   Procedure: APPLICATION OF A-CELL OF CHEST;  Surgeon: Wallace Going, DO;  Location: [place];  Service: Plastics;  Laterality: Left;   APPLICATION OF WOUND VAC N/A 01/27/2019   Procedure: APPLICATION OF WOUND VAC;  Surgeon: Wallace Going, DO;  Location: [place];  Service: Plastics;  Laterality: N/A;   BREAST SURGERY Left 2001   for CA   CARDIOVERSION N/A 02/26/2022   Procedure: CARDIOVERSION;  Surgeon: Donato Heinz, MD;  Location: [place];  Service: Cardiovascular;  Laterality: N/A;   COLONOSCOPY WITH PROPOFOL N/A 06/07/2018   Procedure: COLONOSCOPY WITH PROPOFOL;  Surgeon: Wilford Corner, MD;  Location: [facility];  Service: Endoscopy;  Laterality: N/A;   DEBRIDEMENT AND CLOSURE WOUND Left 12/28/2018   Procedure: Debridement And Closure Wound;  Surgeon: Coralie Keens, MD;  Location: [place];  Service: General;  Laterality: Left;   INCISION AND DRAINAGE OF WOUND Left 01/27/2019   Procedure: IRRIGATION AND DEBRIDEMENT OF CHEST WALL;  Surgeon: Wallace Going, DO;  Location: [place];  Service: Plastics;  Laterality: Left;   KNEE ARTHROSCOPY WITH MEDIAL MENISECTOMY Left 08/06/2021   Procedure: KNEE ARTHROSCOPY WITH PARTIAL MEDIAL MENISECTOMY, DEBRIDEMENT;  Surgeon: Susa Day, MD;  Location: [facility];  Service: Orthopedics;  Laterality: Left;  1 HR   LATISSIMUS FLAP TO BREAST Left 01/18/2019   Procedure: LEFT LATISSIMUS FLAP TO  BREAST;  Surgeon: Wallace Going, DO;  Location: [place];  Service: Plastics;  Laterality: Left;   MITRAL VALVE REPLACEMENT     mechanical   POLYPECTOMY  06/07/2018   Procedure: POLYPECTOMY;  Surgeon: Wilford Corner, MD;  Location: [facility];  Service: Endoscopy;;   RIGHT/LEFT HEART CATH AND CORONARY ANGIOGRAPHY N/A 01/09/2021   Procedure: RIGHT/LEFT HEART CATH AND CORONARY ANGIOGRAPHY;  Surgeon: Jolaine Artist, MD;  Location: [place] [facility];  Service: Cardiovascular;  Laterality: N/A;   WOUND DEBRIDEMENT Left 12/28/2018   Procedure: EXCISION OF LEFT CHRONIC CHEST WALL WOUND;  Surgeon: Coralie Keens, MD;  Location: [place];  Service: General;  Laterality: Left;    Social:  Lives With: daughter Justine Dines Occupation: retired Support: family Level of Function: independent ADLs and iADLs PCP: Iona Coach, MD Substances: current smoker; ~pack a day previously, now 4-5 cigarettes per day. Has smoked for 35-40 years. No  ETOH use  Family History: n/a  Allergies: Allergies as of 03/07/2022 - Review Complete 03/07/2022  Allergen Reaction Noted   Lisinopril Swelling 01/07/2021   Acetaminophen-codeine Itching 07/10/2015   Propoxyphene Itching 01/18/2019   Tape Rash 03/09/2019   Tramadol Itching, Nausea And Vomiting, and Rash 07/10/2015    Review of Systems: A complete ROS was negative except as per HPI.   OBJECTIVE:   Physical Exam: Blood pressure 104/69, pulse 65  temperature 97.7 F (36.5 C), temperature source Oral, resp. rate 18, SpO2 95 %.  Constitutional: well-appearing woman sitting in reclliner bed, in no acute distress HENT: normocephalic atraumatic, mucous membranes moist. Periorbital edema noted bilaterally without restriction of EOM. No tenderness to frontal or maxillary sinuses. No erythema or edema in posterior oropharynx. No cervical tenderness or LAD. No flushing noted. Patient without dentition. No angioedema. Neck: No thyroid enlargement noted  on exam Cardiovascular: regular rate and rhythm, no m/r/g, No JVD Pulmonary/Chest: normal work of breathing on room air, lungs clear to auscultation bilaterally. No crackles  Abdominal: soft, non-tender, non-distended. No fluid wave  Neurological: alert & oriented x 3. FULL EOM, no dysarthria, with intact cranial nerve examination. 5/5 strenght in bilateral upper and lower extremities. Intact light sensation bilaterally. Normal FTN exam. MSK: no gross abnormalities. No pitting edema Skin: warm and dry Psych: Normal mood and affect  Labs: CBC    Component Value Date/Time   WBC 6.6 03/07/2022 1640   RBC 4.18 03/07/2022 1640   HGB 12.2 03/07/2022 1640   HGB 12.8 01/13/2022 1038   HCT 40.0 03/07/2022 1640   HCT 38.8 01/13/2022 1038   PLT 507 (H) 03/07/2022 1640   PLT 427 01/13/2022 1038   MCV 95.7 03/07/2022 1640   MCV 96 01/13/2022 1038   MCH 29.2 03/07/2022 1640   MCHC 30.5 03/07/2022 1640   RDW 13.7 03/07/2022 1640   RDW 11.4 (L) 01/13/2022 1038   LYMPHSABS 1.6 03/07/2022 1640   LYMPHSABS 3.0 04/21/2018 1624   MONOABS 0.7 03/07/2022 1640   EOSABS 0.3 03/07/2022 1640   EOSABS 0.2 04/21/2018 1624   BASOSABS 0.1 03/07/2022 1640   BASOSABS 0.1 04/21/2018 1624     CMP     Component Value Date/Time   NA 134 (L) 03/07/2022 1655   NA 132 (L) 03/02/2022 1617   K 4.1 03/07/2022 1655   CL 100 03/07/2022 1655   CO2 23 03/07/2022 1655   GLUCOSE 93 03/07/2022 1655   BUN 17 03/07/2022 1655   BUN 35 (H) 03/02/2022 1617   CREATININE 1.22 (H) 03/07/2022 1655   CALCIUM 9.0 03/07/2022 1655   PROT 7.8 03/07/2022 1655   PROT 7.9 01/27/2021 1015   ALBUMIN 3.2 (L) 03/07/2022 1655   ALBUMIN 4.6 01/27/2021 1015   AST 35 03/07/2022 1655   ALT 18 03/07/2022 1655   ALKPHOS 199 (H) 03/07/2022 1655   BILITOT 1.5 (H) 03/07/2022 1655   BILITOT 0.5 01/27/2021 1015   GFRNONAA 47 (L) 03/07/2022 1655   GFRAA 104 02/28/2020 1159    Imaging: DG Chest 1 View  Result Date: 03/07/2022 CLINICAL  DATA:  Face and neck swelling.  Trouble swallowing. EXAM: CHEST  1 VIEW COMPARISON:  02/25/2022 and older exams. FINDINGS: Moderate cardiomegaly and changes from previous cardiac surgery and valve replacement, stable. No mediastinal or hilar masses. Bilateral vascular congestion and interstitial thickening, similar to the prior study. Hazy opacity over the left peripheral mid lung is due to overlying pleural thickening, as noted on a CT dated 12/26/2021. No  evidence of pneumonia. No convincing pleural effusion and no pneumothorax. Changes from prior left breast surgery, stable. Skeletal structures are grossly intact. IMPRESSION: 1. Cardiomegaly with chronic interstitial thickening, but no overt pulmonary edema and no evidence of pneumonia. No convincing acute cardiopulmonary disease. Electronically Signed   By: Amie Portland M.D.   On: 03/07/2022 15:18      EKG: personally reviewed my interpretation is normal sinus with regular rhythm and L axis deviation. Qtc prolongation >500 and L ventricular hypertrophy. Prior EKG unchanged except for QTC prolongation as noted below: 9/11 QT:500 9/7: QT 442 8/30: QT 495   ASSESSMENT & PLAN:   Assessment & Plan by Problem: Principal Problem:   Facial edema   Ms. Elham Fini is a 71 yo F with a pertinent medical history of COPD, CAD, chronic combined systolic and diastolic HF, atypical atrial flutter, mechanical MVR on chronic warfarin therapy, recently hospitalized for CHF exacerbation 2/2 atrial flutter and discharged on amiodarone therapy presenting to the ED with facial swelling, found to have QT prolongation, and supratherapeutic INR likely in the setting of Amiodarone therapy  ?Possible amiodarone hypersensitive QT prolongation Facial swelling Subclinical hypothyroidism Patient presenting with new onset periorbital edema without pruritus, urticaria, or pharyngeal swelling or flushing, fluctuance, or erythema, not concerning for allergic process,  infectious etiology, or SVC syndrome. However, patient was also noted to have QT prolongation on admission, not seen on previous exams. Patient has not been on new medications other than Amiodarone since last hospitalization. Facial swelling and QT prolongation have been reported with Amiodarone therapy initiation. Patient is also reporting new cold intolerance in the past two week, and given Amiodarone therapy, TSH was checked on admission. TSH was noted to be 7.3 (2.7 a year ago) and a free T4 of 1.11. Amiodarone was held; however, it has a long half life. Patient was admitted for observation of vital signs to ensure stable status. Cardiology service was consulted by ED provider; appreciate recommendations. Patient  -Monitor telemetry -Continue to monitor vital signs -Cardiology consulted per ED, appreciate recommendations -Continue to hold amiodarone  Chronic systolic HF 2/2 A flutter Atypical atrial fibrillation, s/p cardioversion on 02/26/2022 8/30 ECHO LVEF 35-40% with global hypokinesis, severe RV dysfunction and RV enlargement. Started on GDMT during last hospitalization. BMP 728 today. Euvolemic today and clinically stable with no hypoxia or signs of pulmonary edema on CXR.  -Farxiga 10 mg -Spironolactone 25 mg -Torsemide 20 mg -Metoprolol succinate 25 mg  Mechanical MVR Supratherapeutic INR MVR in 2001. 8/30 ECHo with mitral gradient of 8 mmHg. Patient has been on chronic warfarin therapy. Patient was initiated on Amiodarone therapy for rhythm control during last hospitalization. [facility] at [facility] on 9/11 was 4.6. Patient discontinued therapy for a day as instructed. Normal VS. No signs or symptoms of bleeding on exam today. INR during this hospitalization 4.2.  Suspect warfarin-amiodarone interation decreasing metabolism of warfarin. Amiodarone has been discontinued. Pharmacy is following; appreciate recommendations -Monitor for signs and symptoms of bleeding -Warfarin dosing per  pharmacy  COPD Not currently with signs or symptoms of exacerbation. No current need for O2 therapy. Patient continued on home meds and inhalers -Albuterol nebs -Singulair 10 mg -Anoro Ellipta  AKI Cr 1.22 today, improved from 9/11 and prior hospitalization  -Continue to monitor  Thrombocytosis Platelets 507 on admission, normal during prior hospitalization. No constitutional symptoms. Normal Hgb and WBC differential. No signs or symptoms of blood loss. Likely reactive thrombocytosis in the setting of medication reaction to Amiodarone vs  URI vs chronic inflammation. Repeat WBC with platelets  -CTM CBC post Amiodarone discontinuation -Consider blood smear, ferritin and iron studies if not resolved  Abnormal CMP T bili Alk phos uptrending since 8/16. Patient not complaining of abdominal pain, abnormal stools, or bleeding. Distended abdomen without stigmata for cirrhosis.  Normal AST, ALT. Ddx includes extrahepatic biliary obstruction vs intrahepatic cholestasis. Favor the latter in the setting of new RV failure noted on ECHO. - Fractionated bilirrubin ordered, follow up morning labs - Consider RUQ ultrasound in the AM - Continue to follow CMP  Chronic pain syndrome Patient followed with PM&R physical for multifocal pain syndrome.  -Hydrocodone-acetaminophen 5-325 mg  URI History of nasal congestion and known COVID exposure without SOB, sputum production, chest tightness, or O2 requirement.  -COVID swab negative -Continue to monitor  HLD On home simvastatin  Dysphagia, resolved   Diet: Heart Healthy VTE:  patient currently on warfarin Code: Full  Prior to Admission Living Arrangement: Home, living daughter Anticipated Discharge Location: Home Barriers to Discharge: clinical improvement  Dispo: Admit patient to Observation with expected length of stay less than 2 midnights.  Signed:   Romana Juniper, MD Internal Medicine Resident PGY-1 03/07/2022, 8:59 PM

## 2022-03-07 NOTE — ED Notes (Signed)
Unable to obtain Pt's temperature due to Pt eating ice chips.

## 2022-03-07 NOTE — ED Triage Notes (Signed)
Patient here with complaint of facial swelling that started yesterday and has progressively worse. Patient reports pain around eyes, is speaking in complete sentences, and in no apparent distress at this time.

## 2022-03-07 NOTE — ED Notes (Signed)
Attempted to start IV 3 times lab attempted earlier multiple times IV team consult placed

## 2022-03-07 NOTE — ED Notes (Signed)
Patient transported to X-ray 

## 2022-03-08 ENCOUNTER — Observation Stay (HOSPITAL_COMMUNITY): Payer: Medicare Other

## 2022-03-08 DIAGNOSIS — R220 Localized swelling, mass and lump, head: Secondary | ICD-10-CM | POA: Diagnosis not present

## 2022-03-08 LAB — BILIRUBIN, FRACTIONATED(TOT/DIR/INDIR)
Bilirubin, Direct: 0.7 mg/dL — ABNORMAL HIGH (ref 0.0–0.2)
Indirect Bilirubin: 0.6 mg/dL (ref 0.3–0.9)
Total Bilirubin: 1.3 mg/dL — ABNORMAL HIGH (ref 0.3–1.2)

## 2022-03-08 LAB — BASIC METABOLIC PANEL
Anion gap: 11 (ref 5–15)
Anion gap: 9 (ref 5–15)
BUN: 18 mg/dL (ref 8–23)
BUN: 16 mg/dL (ref 8–23)
CO2: 21 mmol/L — ABNORMAL LOW (ref 22–32)
CO2: 23 mmol/L (ref 22–32)
Calcium: 8.6 mg/dL — ABNORMAL LOW (ref 8.9–10.3)
Calcium: 9 mg/dL (ref 8.9–10.3)
Chloride: 100 mmol/L (ref 98–111)
Chloride: 103 mmol/L (ref 98–111)
Creatinine, Ser: 1.14 mg/dL — ABNORMAL HIGH (ref 0.44–1.00)
Creatinine, Ser: 1.16 mg/dL — ABNORMAL HIGH (ref 0.44–1.00)
GFR, Estimated: 51 mL/min — ABNORMAL LOW (ref >60)
GFR, Estimated: 50 mL/min — ABNORMAL LOW (ref >60)
Glucose, Bld: 90 mg/dL (ref 70–99)
Glucose, Bld: 71 mg/dL (ref 70–99)
Potassium: 3.6 mmol/L (ref 3.5–5.1)
Potassium: 3.6 mmol/L (ref 3.5–5.1)
Sodium: 132 mmol/L — ABNORMAL LOW (ref 135–145)
Sodium: 135 mmol/L (ref 135–145)

## 2022-03-08 LAB — CBC
HCT: 40.8 % (ref 36.0–46.0)
Hemoglobin: 13 g/dL (ref 12.0–15.0)
MCH: 29.3 pg (ref 26.0–34.0)
MCHC: 31.9 g/dL (ref 30.0–36.0)
MCV: 92.1 fL (ref 80.0–100.0)
Platelets: 453 10*3/uL — ABNORMAL HIGH (ref 150–400)
RBC: 4.43 MIL/uL (ref 3.87–5.11)
RDW: 13.6 % (ref 11.5–15.5)
WBC: 6.2 10*3/uL (ref 4.0–10.5)
nRBC: 0.3 % — ABNORMAL HIGH (ref 0.0–0.2)

## 2022-03-08 LAB — PROTIME-INR
INR: 4.2 (ref 0.8–1.2)
Prothrombin Time: 40.2 seconds — ABNORMAL HIGH (ref 11.4–15.2)

## 2022-03-08 LAB — BRAIN NATRIURETIC PEPTIDE: B Natriuretic Peptide: 728 pg/mL — ABNORMAL HIGH (ref 0.0–100.0)

## 2022-03-08 LAB — TSH: TSH: 7.364 u[IU]/mL — ABNORMAL HIGH (ref 0.350–4.500)

## 2022-03-08 LAB — T4, FREE: Free T4: 1.11 ng/dL (ref 0.61–1.12)

## 2022-03-08 MED ORDER — INFLUENZA VAC A&B SA ADJ QUAD 0.5 ML IM PRSY: 0.5000 mL | PREFILLED_SYRINGE | INTRAMUSCULAR | Status: DC | PRN

## 2022-03-08 MED ORDER — ORAL CARE MOUTH RINSE: 15.0000 mL | OROMUCOSAL | Status: DC | PRN

## 2022-03-08 MED ORDER — NICOTINE 21 MG/24HR TD PT24
21.0000 mg | MEDICATED_PATCH | Freq: Every day | TRANSDERMAL | Status: DC
  Administered 2022-03-09: 21 mg via TRANSDERMAL
  Filled 2022-03-08 (×2): qty 1

## 2022-03-08 MED ORDER — HYDROCODONE-ACETAMINOPHEN 5-325 MG PO TABS
1.0000 | ORAL_TABLET | Freq: Four times a day (QID) | ORAL | Status: DC | PRN
  Administered 2022-03-08: 1 via ORAL
  Filled 2022-03-08: qty 1

## 2022-03-08 MED ORDER — SALINE SPRAY 0.65 % NA SOLN
1.0000 | NASAL | Status: DC | PRN
  Filled 2022-03-08: qty 44

## 2022-03-08 MED ORDER — HYDROCODONE-ACETAMINOPHEN 5-325 MG PO TABS: 2.0000 | ORAL_TABLET | Freq: Four times a day (QID) | ORAL | Status: DC | PRN

## 2022-03-08 MED ORDER — WARFARIN - PHARMACIST DOSING INPATIENT: Freq: Every day | Status: DC

## 2022-03-08 MED ORDER — HYDROCODONE-ACETAMINOPHEN 5-325 MG PO TABS
1.0000 | ORAL_TABLET | ORAL | Status: DC | PRN
  Administered 2022-03-08 – 2022-03-09 (×4): 1 via ORAL
  Filled 2022-03-08 (×4): qty 1

## 2022-03-08 NOTE — Care Plan (Signed)
CT on hold, needs 20G or larger. See previous note from IV Team, new IV was placed but is only a 22g and questionable.

## 2022-03-08 NOTE — Progress Notes (Signed)
Subjective: Patient examined at bedside. States she is feeling well. Feels that the left side of her face is less swollen but still puffy around the eyes. Feels the right side of her face is back to normal. Denies shortness of breath, swelling in lips, tongue or throat.   Objective:  Vital signs in last 24 hours: Vitals:   03/08/22 0414 03/08/22 0822 03/08/22 0905 03/08/22 1129  BP:  107/89  97/64  Pulse:  75 76 71  Resp:  '20 18 16  '$ Temp:  98.3 F (36.8 C)  98.2 F (36.8 C)  TempSrc:  Oral  Oral  SpO2: 92% 96% 96% 98%  Weight:      Height:       Constitutional: Appears well-developed and well-nourished. No distress. Sitting up in bed HENT: facial plethora of the left side with mild  periorbital edema. no swelling of the tongue, lips, or throat, no erythema, warmth, or rash. No thyromegaly or lymphadenopathy  Cardiovascular: Normal rate, regular rhythm, systolic murmur, no jvd Respiratory: No respiratory distress, no accessory muscle use.  Effort is normal.  Lungs are clear to auscultation bilaterally. GI: Nondistended, soft, nontender to palpation, normal active bowel sounds Musculoskeletal: No peripheral edema noted. Neurological: Is alert and oriented x4, no apparent focal deficits noted. Skin: Warm and dry.  No rash, erythema, lesions noted. Psychiatric: Normal mood and affect.  Assessment/Plan:  Principal Problem:   Facial edema Active Problems:   Chronic combined systolic and diastolic heart failure (HCC)   Essential hypertension   Chronic anticoagulation   Atypical atrial flutter (HCC)  Facial swelling Presented with 2 days of facial swelling. Inially bilateral. This morning right side appears back to baseline per patient and left side is much improved. Intial concern that amiodarone may be causing her symptoms. Given long half life of amiodarone would not expect to see such improvement even though her last dose was 2 days ago. Sx do no seem consistent with angio  edema given lack of lip, tongue or pharyngeal edema. She does not recall any new exposures. Daughter reports some nasal congestion 3-4 days ago but would to expect this to cause her sx. She does endorse about 15 pack year history of smoking and is a current smoker. Will check imaging for thoracic outlet obstruction.  - CT neck and chest with contrast - continue to monitor symptoms - Outpatient referral to allergy  Prolonged QT Noted on EKG on 9/11. On repeat EKG on admission QT improved. May be related to amiodarone, opoid can also increase Qt interval. Has been taking hydrocodone-acetaminophen for chronic back pain. Will need to discuss with with patient in the morning/ -Hold amiodarone  HFrEF Last echo 8/30 with EF 35-40% with global hypokinesis, severe RV dysfunction and enlargement. Remains Euvolemic on exam. Weight up 2 KG from last admission. -continue home farxiga 10 mg daily, spironolactone 25 mg daily, torsemide 20 mg daily, metoprolol 25 mg daily -I/os -weights  Mechanical MVR Supratherapeutic INR INR 4.2. likely in setting of amiodarone. No signs of bleeding hgb stable. Holding warfarin until INR at goal of 2.5-3.5.  -Warfarin dosing per pharmacy   COPD Stable on room air continue home inhalers -Albuterol nebs, Singulair 10 mg, Anoro Ellipta   AKI Cr improved to 1.14 today -CTM BMP   HLD -Continue simvastatin   Dysphagia, resolved  Prior to Admission Living Arrangement: Home Anticipated Discharge Location: Home Barriers to Discharge: Medical management Dispo: Anticipated discharge in approximately 1 day(s).   Iona Beard, MD 03/08/2022,  3:13 PM Pager: [phone] After 5pm on weekdays and 1pm on weekends: On Call pager [phone]

## 2022-03-08 NOTE — Care Management Obs Status (Signed)
[employer] NOTIFICATION   Patient Details  Name: Kim Anthony [name]: [id number] Date of Birth: 1950/07/07   Medicare Observation Status Notification Given:  Yes    Carles Collet, RN 03/08/2022, 4:10 PM

## 2022-03-08 NOTE — Progress Notes (Signed)
ANTICOAGULATION CONSULT NOTE - Initial Consult  Pharmacy Consult for wARFARIN Indication:  mvr  Allergies  Allergen Reactions   Lisinopril Swelling   Acetaminophen-Codeine Itching   Propoxyphene Itching    Darvocet   Tape Rash    Plastic   Tramadol Itching, Nausea And Vomiting and Rash    Patient Measurements: Height: '5\' 5"'$  (165.1 cm) Weight: 81.2 kg (179 lb 0.2 oz) IBW/kg (Calculated) : 57   Vital Signs: Temp: 98 F (36.7 C) (09/17 0350) Temp Source: Oral (09/17 0350) BP: 125/84 (09/17 0350) Pulse Rate: 73 (09/17 0350)  Labs: Recent Labs    03/07/22 1640 03/07/22 1655 03/08/22 0217  HGB 12.2  --  13.0  HCT 40.0  --  40.8  PLT 507*  --  453*  LABPROT  --   --  40.2*  INR  --   --  4.2*  CREATININE  --  1.22* 1.14*    Estimated Creatinine Clearance: 47 mL/min (A) (by C-G formula based on SCr of 1.14 mg/dL (H)).   Medical History: Past Medical History:  Diagnosis Date   Arthritis    Asthma    Breast cancer ([place]) 2001   Left Breast Cancer   CHF (congestive heart failure) (HCC)    COPD (chronic obstructive pulmonary disease) (HCC)    Coronary artery disease    GERD (gastroesophageal reflux disease)    History of mitral valve replacement with mechanical valve    HLD (hyperlipidemia)    Hypertension    Pulmonary embolism ([place]) 2021    Assessment: 71 yo female presented for perceived swelling of her periorbital tissue, today she felt the swelling was much worse in her face and started to have a feeling like her throat was swelling as well. Was on Warfarin PTA fgor MVR - goal INR 2.5 - 3.5.  Was supposed to be taking warfarin 5 mg on Sundays, Tuesdays, Wednesdays, Fridays and Saturdays. Take 2.5 mg on Mondays and Thursdays   Pharmacy consulted to continue Warfarin therapy.  INR on admission 4.2.  Goal of Therapy:  INR 2.5 - 3.5   Plan:  Hold warfarin until INR down to < 3.5 Daily INR and CBC  Alanda Slim, PharmD, [facility] Clinical Pharmacist Please  see AMION for all Pharmacists' Contact Phone Numbers 03/08/2022, 4:07 AM

## 2022-03-08 NOTE — Plan of Care (Signed)
  Problem: Clinical Measurements: Goal: Diagnostic test results will improve Outcome: Not Progressing   Problem: Pain Managment: Goal: General experience of comfort will improve Outcome: Not Progressing   

## 2022-03-08 NOTE — Progress Notes (Signed)
PIV consult: LUE restricted due to hx of breast CA, per pt. R upper arm edematous and weeping.  PIV placed in R forearm with US guidance for venous access in case urgent meds are needed. Recommend avoiding infusions via this site until upper arm swelling subsides as it is difficult to tell via [place] whether branches from the vein feeds into the previous site.

## 2022-03-09 ENCOUNTER — Observation Stay (HOSPITAL_COMMUNITY): Payer: Medicare Other

## 2022-03-09 DIAGNOSIS — R911 Solitary pulmonary nodule: Secondary | ICD-10-CM | POA: Diagnosis not present

## 2022-03-09 DIAGNOSIS — G540 Brachial plexus disorders: Secondary | ICD-10-CM | POA: Diagnosis not present

## 2022-03-09 DIAGNOSIS — R600 Localized edema: Secondary | ICD-10-CM | POA: Diagnosis not present

## 2022-03-09 DIAGNOSIS — J811 Chronic pulmonary edema: Secondary | ICD-10-CM | POA: Diagnosis not present

## 2022-03-09 DIAGNOSIS — Z7901 Long term (current) use of anticoagulants: Secondary | ICD-10-CM | POA: Diagnosis not present

## 2022-03-09 DIAGNOSIS — J90 Pleural effusion, not elsewhere classified: Secondary | ICD-10-CM | POA: Diagnosis not present

## 2022-03-09 DIAGNOSIS — I672 Cerebral atherosclerosis: Secondary | ICD-10-CM | POA: Diagnosis not present

## 2022-03-09 DIAGNOSIS — Z952 Presence of prosthetic heart valve: Secondary | ICD-10-CM | POA: Diagnosis not present

## 2022-03-09 DIAGNOSIS — R220 Localized swelling, mass and lump, head: Secondary | ICD-10-CM | POA: Diagnosis not present

## 2022-03-09 DIAGNOSIS — I2782 Chronic pulmonary embolism: Secondary | ICD-10-CM | POA: Diagnosis not present

## 2022-03-09 DIAGNOSIS — J439 Emphysema, unspecified: Secondary | ICD-10-CM | POA: Diagnosis not present

## 2022-03-09 DIAGNOSIS — I6523 Occlusion and stenosis of bilateral carotid arteries: Secondary | ICD-10-CM | POA: Diagnosis not present

## 2022-03-09 DIAGNOSIS — I771 Stricture of artery: Secondary | ICD-10-CM | POA: Diagnosis not present

## 2022-03-09 DIAGNOSIS — R2689 Other abnormalities of gait and mobility: Secondary | ICD-10-CM | POA: Diagnosis not present

## 2022-03-09 LAB — CBC
HCT: 38.8 % (ref 36.0–46.0)
Hemoglobin: 12.2 g/dL (ref 12.0–15.0)
MCH: 29.1 pg (ref 26.0–34.0)
MCHC: 31.4 g/dL (ref 30.0–36.0)
MCV: 92.6 fL (ref 80.0–100.0)
Platelets: 408 K/uL — ABNORMAL HIGH (ref 150–400)
RBC: 4.19 MIL/uL (ref 3.87–5.11)
RDW: 13.9 % (ref 11.5–15.5)
WBC: 5.6 K/uL (ref 4.0–10.5)
nRBC: 0 % (ref 0.0–0.2)

## 2022-03-09 LAB — BASIC METABOLIC PANEL
Anion gap: 7 (ref 5–15)
BUN: 13 mg/dL (ref 8–23)
CO2: 28 mmol/L (ref 22–32)
Calcium: 8.9 mg/dL (ref 8.9–10.3)
Chloride: 103 mmol/L (ref 98–111)
Creatinine, Ser: 1.08 mg/dL — ABNORMAL HIGH (ref 0.44–1.00)
GFR, Estimated: 55 mL/min — ABNORMAL LOW (ref >60)
Glucose, Bld: 134 mg/dL — ABNORMAL HIGH (ref 70–99)
Potassium: 3 mmol/L — ABNORMAL LOW (ref 3.5–5.1)
Sodium: 138 mmol/L (ref 135–145)

## 2022-03-09 LAB — PROTIME-INR
INR: 2.6 — ABNORMAL HIGH (ref 0.8–1.2)
Prothrombin Time: 27.4 s — ABNORMAL HIGH (ref 11.4–15.2)

## 2022-03-09 MED ORDER — AMIODARONE HCL 200 MG PO TABS
200.0000 mg | ORAL_TABLET | Freq: Every day | ORAL | Status: DC
  Filled 2022-03-09: qty 1

## 2022-03-09 MED ORDER — WARFARIN SODIUM 5 MG PO TABS: 5.0000 mg | ORAL_TABLET | Freq: Once | ORAL | Status: DC

## 2022-03-09 MED ORDER — IOHEXOL 350 MG/ML SOLN
80.0000 mL | Freq: Once | INTRAVENOUS | Status: AC | PRN
  Administered 2022-03-09: 80 mL via INTRAVENOUS

## 2022-03-09 NOTE — Progress Notes (Signed)
ANTICOAGULATION CONSULT NOTE - Initial Consult  Pharmacy Consult for Warfarin Indication: Mech MVR 2001  Allergies  Allergen Reactions   Lisinopril Swelling   Acetaminophen-Codeine Itching   Propoxyphene Itching    Darvocet   Tape Rash    Plastic   Tramadol Itching, Nausea And Vomiting and Rash    Patient Measurements: Height: '5\' 5"'$  (165.1 cm) Weight: 79.8 kg (175 lb 14.8 oz) IBW/kg (Calculated) : 57   Vital Signs: Temp: 98 F (36.7 C) (09/18 1106) Temp Source: Oral (09/18 1106) BP: 108/72 (09/18 1106) Pulse Rate: 73 (09/18 1106)  Labs: Recent Labs    03/07/22 1640 03/07/22 1655 03/08/22 0217 03/08/22 2142 03/09/22 0945  HGB 12.2  --  13.0  --   --   HCT 40.0  --  40.8  --   --   PLT 507*  --  453*  --   --   LABPROT  --   --  40.2*  --  27.4*  INR  --   --  4.2*  --  2.6*  CREATININE  --  1.22* 1.14* 1.16*  --     Estimated Creatinine Clearance: 45.7 mL/min (A) (by C-G formula based on SCr of 1.16 mg/dL (H)).   Medical History: Past Medical History:  Diagnosis Date   Arthritis    Asthma    Breast cancer ([place]) 2001   Left Breast Cancer   CHF (congestive heart failure) (HCC)    COPD (chronic obstructive pulmonary disease) (HCC)    Coronary artery disease    GERD (gastroesophageal reflux disease)    History of mitral valve replacement with mechanical valve    HLD (hyperlipidemia)    Hypertension    Pulmonary embolism ([place]) 2021    Assessment: 71 yo female presented for perceived swelling of her periorbital tissue, today she felt the swelling was much worse in her face and started to have a feeling like her throat was swelling as well. Patient on Warfarin PTA for mechanical MVR 2001, goal INR 2.5 - 3.5. INR on admission 4.2. Pharmacy consulted to continue Warfarin therapy.  INR down to 2.6. CBC stable.   PTA warfarin: 2.5 mg Mon/Thur, 5 mg all other days per 9/11 [facility] note. Per patient, takes half tablets some days and full tablets other days but  doesn't remember which days of the week  Goal of Therapy:  INR 2.5 - 3.5 Monitor platelets per anticoagulation protocol: yes    Plan:  Warfarin '5mg'$  x1 Monitor daily INR, CBC Monitor for signs/symptoms of bleeding    Benetta Spar, PharmD, BCPS, BCCP Clinical Pharmacist  Please check AMION for all [place] phone numbers After 10:00 PM, call [place]

## 2022-03-09 NOTE — TOC Progression Note (Addendum)
Transition of Care [facility]) - Progression Note    Patient Details  Name: Kim Anthony [name]: [id number] Date of Birth: 1950/12/25  Transition of [facility]) CM/SW Contact  Zenon Mayo, RN Phone Number: 03/09/2022, 9:56 AM  Clinical Narrative:    From home, presents with facial edema, which has resolved, for possible dc today, has no needs.  Patient states she can not ambulate good without getting tired she needs a rollator,  NCM informed Dr. Elliot Gurney (resident) , he is ok with this NCM ordering a rollator in Dr. Lalla Brothers name with verbal order since he is the attending of teaching service.  NCM will order thru parachute with [employer], patient is ok with [employer] supplying the rollator. .  TOC following.         Expected Discharge Plan and Services                                                 Social Determinants of Health (SDOH) Interventions    Readmission Risk Interventions    02/19/2022    3:36 PM  Readmission Risk Prevention Plan  Transportation Screening Complete  PCP or Specialist Appt within 3-5 Days Complete  HRI or [place] Complete  Social Work Consult for [place] Planning/Counseling Complete  Palliative Care Screening Not Applicable  Medication Review Press photographer) Complete

## 2022-03-09 NOTE — Progress Notes (Signed)
Mobility Specialist Progress Note:   03/09/22 1455  Mobility  Activity Ambulated with assistance in room  Level of Assistance Standby assist, set-up cues, supervision of patient - no hands on  Assistive Device None  Distance Ambulated (ft) 30 ft  Activity Response Tolerated well  $Mobility charge 1 Mobility   Pt up in room. No complaints of pain. Left EOB with all needs met.   [facility] Surveyor, mining Chat only

## 2022-03-09 NOTE — TOC Transition Note (Signed)
Transition of Care [facility]) - CM/SW Discharge Note   Patient Details  Name: Kim Anthony [name]: [id number] Date of Birth: 1950-08-05  Transition of Care [facility]) CM/SW Contact:  Zenon Mayo, RN Phone Number: 03/09/2022, 2:38 PM   Clinical Narrative:    Patient is for dc today, the rollator was delivered to the room, patient's daughter will transport her home at dc. She has no other needs.         Patient Goals and CMS Choice        Discharge Placement                       Discharge Plan and Services                                     Social Determinants of Health (SDOH) Interventions     Readmission Risk Interventions    02/19/2022    3:36 PM  Readmission Risk Prevention Plan  Transportation Screening Complete  PCP or Specialist Appt within 3-5 Days Complete  HRI or [place] Complete  Social Work Consult for [place] Planning/Counseling Complete  Palliative Care Screening Not Applicable  Medication Review Press photographer) Complete

## 2022-03-09 NOTE — Discharge Summary (Cosign Needed Addendum)
Name: Kim Anthony [name]: [id number] DOB: 1950-10-27 71 y.o. PCP: Iona Coach, MD  Date of Admission: 03/07/2022 12:10 PM Date of Discharge: 03/09/22 Attending Physician: Dr. Evette Doffing  Discharge Diagnosis: Principal Problem:   Facial edema Active Problems:   Chronic combined systolic and diastolic heart failure (HCC)   Essential hypertension   Chronic anticoagulation   Atypical atrial flutter [facility])    Discharge Medications: Allergies as of 03/09/2022       Reactions   Lisinopril Swelling   Acetaminophen-codeine Itching   Propoxyphene Itching   Darvocet   Tape Rash   Plastic   Tramadol Itching, Nausea And Vomiting, Rash        Medication List     TAKE these medications    albuterol 108 (90 Base) MCG/ACT inhaler Commonly known as: VENTOLIN HFA Inhale 2 puffs into the lungs every 6 (six) hours as needed for wheezing or shortness of breath.   amiodarone 200 MG tablet Commonly known as: PACERONE Take 1 tablet (200 mg total) by mouth daily.   BreatheRite Coll Spacer Adult Misc 1 applicator by Does not apply route daily.   diphenhydrAMINE 25 MG tablet Commonly known as: BENADRYL Take 75 mg by mouth daily as needed for allergies.   famotidine 20 MG tablet Commonly known as: PEPCID Take 1 tablet (20 mg total) by mouth 2 (two) times daily.   Farxiga 10 MG Tabs tablet Generic drug: dapagliflozin propanediol Take 1 tablet by mouth once daily   fluticasone 50 MCG/ACT nasal spray Commonly known as: Flonase Place 1 spray into both nostrils daily as needed for allergies.   gabapentin 600 MG tablet Commonly known as: NEURONTIN Take 1.5 tablets (900 mg total) by mouth 3 (three) times daily. Take 1 and 1/2 tablets by mouth three times a day for nerve pain   GAS-X PO Take 1 tablet by mouth daily.   HYDROcodone-acetaminophen 5-325 MG tablet Commonly known as: Norco Take 1 tablet by mouth every 4 (four) hours as needed for moderate pain.   metoprolol succinate 25  MG 24 hr tablet Commonly known as: TOPROL-XL Take 1 tablet (25 mg total) by mouth daily.   montelukast 10 MG tablet Commonly known as: Singulair Take 1 tablet (10 mg total) by mouth at bedtime.   simvastatin 20 MG tablet Commonly known as: ZOCOR Take 1 tablet (20 mg total) by mouth daily.   spironolactone 25 MG tablet Commonly known as: ALDACTONE Take 1 tablet by mouth once daily   Stiolto Respimat 2.5-2.5 MCG/ACT Aers Generic drug: Tiotropium Bromide-Olodaterol Inhale 2 puffs by mouth daily   torsemide 20 MG tablet Commonly known as: DEMADEX Take 1 tablet (20 mg total) by mouth daily.   warfarin 5 MG tablet Commonly known as: COUMADIN Take as directed. If you are unsure how to take this medication, talk to your nurse or doctor. Original instructions: Take 0.5 tablets (2.5 mg total) by mouth daily at 4 PM for 30 doses.               Durable Medical Equipment  (From admission, onward)           Start     Ordered   03/09/22 1049  For home use only DME 4 wheeled rolling walker with seat  Once       Question:  Patient needs a walker to treat with the following condition  Answer:  Weakness   03/09/22 1048            Disposition and follow-up:  Ms.Valerya B Saperstein was discharged from [facility] in Stable condition.  At the hospital follow up visit please address:  1.  Follow-up:  a. Facial swelling - resolved. Ensure no recurrence of symptoms. Allergist referral placed, ensure appropriate follow up.     b. Lymphadenopathy - seen on CT scan. Hx of breast cancer. Smoking hx. Consider PET-CT   c. Prolonged QT - amiodarone held. To discuss resuming this med with Cardiology at follow up. May need to minimize other qt prolonging agents.    d. INR - INR check with Dr. Elie Confer. Amiodarone has been impacting INR as well.    E. Subclinical hypothyroid - would not start synthroid given pt age, diminutive symptoms, and, underlying tendency for  arrhythmias.    2.  Labs / imaging needed at time of follow-up: CBC, BMP INR, EKG  3.  Pending labs/ test needing follow-up: none  4.  Medication Changes  Started: none  Stopped: Amiodarone held  Follow-up Appointments:  Follow-up Information     Iona Coach, MD. Go on 03/30/2022.   Why: '@1'$ :45pm Contact information: [place] [address] [phone]                 Hospital Course by problem list:  Facial swelling - patient presented with complaints of 2 days of facial swelling. Swelling was initially bilaterally, but improved overnight. Due to concern that swelling was due to amiodarone, this medication was held, however, edema improved despite medications long half life. She does have a smoking history so CT of chest, head, and neck were checked to evaluate for possible SVC syndrome. Fortunately, scan did not show any vascular impingement. Suspected possible allergen exposure so Allergist referral was placed for outpatient basis and patient was discharged home in stable condition with resolution of symptoms.   Prolonged QT - patient noted to have prolonged QT interval on EKG on admission. She had been taking amiodarone as an outpatient which was held. Planned to continue holding medicine until she could follow up with cardiology as an outpatient later in the week.   Mechanical MVR - patient is anticoagulated with warfarin. Admission INR 4.2, likely due to medication interaction with amiodarone. Amiodarone held on admission. Repeat INR in am improved to 2.6. Discharged home with instruction to follow up with Dr. Elie Confer for INR check.    Discharge Subjective: Seen and evaluated bedside. Feels as though swelling has returned to normal. Not having any difficulty breathing and no SOB.   Discharge Exam:   BP 108/72 (BP Location: Right Arm)   Pulse 73   Temp 98 F (36.7 C) (Oral)   Resp 16   Ht '5\' 5"'$  (1.651 m)   Wt 79.8 kg   SpO2 98%   BMI 29.28 kg/m   Constitutional: Appears well-developed and well-nourished. No distress. Sitting up in bed HENT: No facial plethora noted. No swelling of tongue, lips, or periorbital region. No thyromegaly. Negative Pemberton's sign.   Cardiovascular: Normal rate, regular rhythm, systolic murmur, no jvd Respiratory: No respiratory distress, no accessory muscle use.  Effort is normal.  Lungs are clear to auscultation bilaterally. Musculoskeletal: No peripheral edema noted. Neurological: Is alert and oriented, no apparent focal deficits noted. Skin: Warm and dry.  No rash, erythema, lesions noted. Psychiatric: Normal mood and affect.  Pertinent Labs, Studies, and Procedures:     Latest Ref Rng & Units 03/09/2022    9:45 AM 03/08/2022    2:17 AM 03/07/2022    4:40  PM  CBC  WBC 4.0 - 10.5 K/uL 5.6  6.2  6.6   Hemoglobin 12.0 - 15.0 g/dL 12.2  13.0  12.2   Hematocrit 36.0 - 46.0 % 38.8  40.8  40.0   Platelets 150 - 400 K/uL 408  453  507        Latest Ref Rng & Units 03/09/2022    9:45 AM 03/08/2022    9:42 PM 03/08/2022    2:17 AM  CMP  Glucose 70 - 99 mg/dL 134  71  90   BUN 8 - 23 mg/dL '13  16  18   '$ Creatinine 0.44 - 1.00 mg/dL 1.08  1.16  1.14   Sodium 135 - 145 mmol/L 138  135  132   Potassium 3.5 - 5.1 mmol/L 3.0  3.6  3.6   Chloride 98 - 111 mmol/L 103  103  100   CO2 22 - 32 mmol/L '28  23  21   '$ Calcium 8.9 - 10.3 mg/dL 8.9  9.0  8.6   Total Bilirubin 0.3 - 1.2 mg/dL   1.3     CT HEAD WO CONTRAST (5MM)  Result Date: 03/08/2022 CLINICAL DATA:  Facial swelling rule out SVC syndrome EXAM: CT HEAD WITHOUT CONTRAST TECHNIQUE: Contiguous axial images were obtained from the base of the skull through the vertex without intravenous contrast. RADIATION DOSE REDUCTION: This exam was performed according to the departmental dose-optimization program which includes automated exposure control, adjustment of the mA and/or kV according to patient size and/or use of iterative reconstruction technique.  COMPARISON:  CT head 08/10/2017 FINDINGS: Brain: No evidence of acute infarction, hemorrhage, hydrocephalus, extra-axial collection or mass lesion/mass effect. Vascular: Negative for hyperdense vessel Skull: Negative Sinuses/Orbits: Mild mucosal edema paranasal sinuses. Negative orbit Other: None IMPRESSION: Negative CT head Electronically Signed   By: Franchot Gallo M.D.   On: 03/08/2022 18:18   DG Chest 1 View  Result Date: 03/07/2022 CLINICAL DATA:  Face and neck swelling.  Trouble swallowing. EXAM: CHEST  1 VIEW COMPARISON:  02/25/2022 and older exams. FINDINGS: Moderate cardiomegaly and changes from previous cardiac surgery and valve replacement, stable. No mediastinal or hilar masses. Bilateral vascular congestion and interstitial thickening, similar to the prior study. Hazy opacity over the left peripheral mid lung is due to overlying pleural thickening, as noted on a CT dated 12/26/2021. No evidence of pneumonia. No convincing pleural effusion and no pneumothorax. Changes from prior left breast surgery, stable. Skeletal structures are grossly intact. IMPRESSION: 1. Cardiomegaly with chronic interstitial thickening, but no overt pulmonary edema and no evidence of pneumonia. No convincing acute cardiopulmonary disease. Electronically Signed   By: Lajean Manes M.D.   On: 03/07/2022 15:18     Discharge Instructions: Discharge Instructions     Ambulatory referral to Allergy   Complete by: As directed    Call MD for:  difficulty breathing, headache or visual disturbances   Complete by: As directed    Call MD for:  extreme fatigue   Complete by: As directed    Call MD for:  hives   Complete by: As directed    Call MD for:  persistant dizziness or light-headedness   Complete by: As directed    Call MD for:  persistant nausea and vomiting   Complete by: As directed    Call MD for:  redness, tenderness, or signs of infection (pain, swelling, redness, odor or green/yellow discharge around  incision site)   Complete by: As directed  Call MD for:  severe uncontrolled pain   Complete by: As directed    Call MD for:  temperature >100.4   Complete by: As directed    Diet - low sodium heart healthy   Complete by: As directed    Increase activity slowly   Complete by: As directed      We treated you in the hospital for facial swelling. You symptoms improved. We did a CT scan of your neck and chest to evaluate for blocked veins in your neck and chest. Fortunately this CT scan did not show any blockage. We recommend that you follow up as an outpatient with an allergist. I have placed a referral to an Allergist once discharged. Please make sure this appointment has been scheduled when you see your PCP for hospital follow up. Please follow up with Dr. Lisabeth Devoid in [facility] clinic on October 9th 1:45pm.  Please also hold your amiodarone until you can be seen by the Cardiologists this Friday.  Please continue taking your other medicines as you have been.  Signed: Delene Ruffini, MD 03/09/2022, 4:20 PM   Pager: [phone]

## 2022-03-09 NOTE — Discharge Instructions (Addendum)
Dear [name],  We treated you in the hospital for facial swelling. You symptoms improved. We did a CT scan of your neck and chest to evaluate for blocked veins in your neck and chest. Fortunately this CT scan did not show any blockage. We recommend that you follow up as an outpatient with an allergist. I have placed a referral to an Allergist once discharged. Please make sure this appointment has been scheduled when you see your PCP for hospital follow up. Please follow up with Dr. Lisabeth Devoid in [facility] clinic on October 9th 1:45pm.  Please also hold your amiodarone until you can be seen by the Cardiologists this Friday.  Please continue taking your other medicines as you have been.

## 2022-03-10 ENCOUNTER — Ambulatory Visit: Payer: Medicare Other | Attending: Audiologist | Admitting: Audiologist

## 2022-03-11 NOTE — Progress Notes (Signed)
Advanced Heart Failure Clinic Note   PCP: Iona Coach, MD HF Cardiologist: Dr. Haroldine Laws  HPI: Kim Anthony is a 71 y.o. female with a history of mechanical MVR in 2001 in [place], [place], hx breast cancer s/p left mastectomy and radiation/chemo, tobacco use, COPD, prior PE/DVT, HTN, undergoing workup for possible inflammatory arthritis with Rheumatology, prior CABG (she is unaware of prior CAD), and systolic CHF.   EF (2017) 45-50% (no EF on file prior to this). Had improved to 55-60% on Echo in July 2020.  She was admitted 01/05/21 with A/C CHF exacerbation and likely COPD exacerbation.Echo with EF of 35-40%, previously 55-60% on echo July 2020. No significant MR. Mildly enlarged RV. Etiology for decline in EF was not certain so she was scheduled for [facility] to rule out obstructive CAD. R/LHC showed 1v CAD with occluded mRCA and stable hemodynamics.  Echo 08/01/21 EF 50-55% RV moderately down. MVR ok Mean gradient 2.7  Admitted 8/23 w/ a/c CHF and AFL. Echo showed EF 35-40%, LV global HK, severe RV dysfunction, mean mitral valve gradient 8 mmHg, severe TR. Diuresed with IV lasix and started on oral amiodarone load.. Underwent TEE showing LVEF 35-40%, global HK, hypokinetic RV poorly visualized, no thrombus in LAA. Unfortunately patient had increased secretions and laryngospams and test aborted. Later underwent DCCV from AFL to NSR. She was discharged home on amiodarone.  Admitted 9/23 with facial swelling. Resolved and referred to allergy. QTc long on ECG (QTc 410 msec on ECG 03/09/22), and amiodarone stopped.  Today she returns for post hospital HF follow up with her daughter. Overall feeling fine. Having swelling in feet and has not had lasix in several days. No palpitations, abnormal bleeding, CP, dizziness, or PND/Orthopnea. Appetite ok. No fever or chills. She does not weigh at home. INR managed by Pharmacist Dr. Elie Confer. Smokes 3-4 cig/day. Drinks 1 12 oz beer/day. Daughter says she  snores.   Cardiac Studies:  - Echo (8/23): EF 35-40%, LV global HK, severe RV dysfunction, mean mitral valve gradient 8 mmHg, severe TR.   - Echo (2/23): EF 50-55%, RV moderately down, MVR ok  - Echo (7/22): EF 35-40% (Dr. Jerilee Field felt EF 45%)  - R/LHC (7/22): with 1v CAD with occluded mRCA, RA 9, PCW 14, CI 2.9.  - Echo (2020): EF 55-60%    Past Medical History:  Diagnosis Date   Arthritis    Asthma    Breast cancer ([place]) 2001   Left Breast Cancer   CHF (congestive heart failure) (HCC)    COPD (chronic obstructive pulmonary disease) ([place])    Coronary artery disease    GERD (gastroesophageal reflux disease)    History of mitral valve replacement with mechanical valve    HLD (hyperlipidemia)    Hypertension    Pulmonary embolism ([place]) 2021   Current Outpatient Medications  Medication Sig Dispense Refill   albuterol (VENTOLIN HFA) 108 (90 Base) MCG/ACT inhaler Inhale 2 puffs into the lungs every 6 (six) hours as needed for wheezing or shortness of breath. 8 g 2   diphenhydrAMINE (BENADRYL) 25 MG tablet Take 75 mg by mouth daily as needed for allergies.     famotidine (PEPCID) 20 MG tablet Take 1 tablet (20 mg total) by mouth 2 (two) times daily. 60 tablet 0   FARXIGA 10 MG TABS tablet Take 1 tablet by mouth once daily 90 tablet 0   fluticasone (FLONASE) 50 MCG/ACT nasal spray Place 1 spray into both nostrils daily as needed for allergies. 9.9 mL 2  gabapentin (NEURONTIN) 600 MG tablet Take 1.5 tablets (900 mg total) by mouth 3 (three) times daily. Take 1 and 1/2 tablets by mouth three times a day for nerve pain 90 tablet 2   HYDROcodone-acetaminophen (NORCO) 5-325 MG tablet Take 1 tablet by mouth every 4 (four) hours as needed for moderate pain. 150 tablet 0   metoprolol succinate (TOPROL-XL) 25 MG 24 hr tablet Take 1 tablet (25 mg total) by mouth daily. 90 tablet 2   montelukast (SINGULAIR) 10 MG tablet Take 1 tablet (10 mg total) by mouth at bedtime. (Patient taking  differently: Take 10 mg by mouth at bedtime. As needed) 90 tablet 2   Simethicone (GAS-X PO) Take 1 tablet by mouth daily.     simvastatin (ZOCOR) 20 MG tablet Take 1 tablet (20 mg total) by mouth daily. 90 tablet 2   Spacer/Aero-Holding Chambers (BREATHERITE COLL SPACER ADULT) MISC 1 applicator by Does not apply route daily. 1 each 0   spironolactone (ALDACTONE) 25 MG tablet Take 1 tablet by mouth once daily 90 tablet 3   Tiotropium Bromide-Olodaterol (STIOLTO RESPIMAT) 2.5-2.5 MCG/ACT AERS Inhale 2 puffs by mouth daily 4 g 0   warfarin (COUMADIN) 5 MG tablet Take 0.5 tablets (2.5 mg total) by mouth daily at 4 PM for 30 doses. 15 tablet 0   No current facility-administered medications for this encounter.   Allergies  Allergen Reactions   Lisinopril Swelling   Acetaminophen-Codeine Itching   Propoxyphene Itching    Darvocet   Tape Rash    Plastic   Tramadol Itching, Nausea And Vomiting and Rash   Social History   Socioeconomic History   Marital status: Single    Spouse name: Not on file   Number of children: Not on file   Years of education: Not on file   Highest education level: Not on file  Occupational History   Not on file  Tobacco Use   Smoking status: Every Day    Packs/day: 0.10    Years: 30.00    Total pack years: 3.00    Types: Cigarettes   Smokeless tobacco: Never   Tobacco comments:    5 cigs per day  Vaping Use   Vaping Use: Never used  Substance and Sexual Activity   Alcohol use: Yes    Alcohol/week: 3.0 standard drinks of alcohol    Types: 3 Standard drinks or equivalent per week    Comment: beer- ocasional   Drug use: No   Sexual activity: Not Currently    Birth control/protection: None  Other Topics Concern   Not on file  Social History Narrative   ** Merged History Encounter **       Social Determinants of Health   Financial Resource Strain: Medium Risk (02/27/2021)   Overall Financial Resource Strain (CARDIA)    Difficulty of Paying Living  Expenses: Somewhat hard  Food Insecurity: No Food Insecurity (02/24/2022)   Hunger Vital Sign    Worried About Running Out of Food in the Last Year: Never true    Ran Out of Food in the Last Year: Never true  Transportation Needs: No Transportation Needs (03/06/2022)   PRAPARE - Hydrologist (Medical): No    Lack of Transportation (Non-Medical): No  Physical Activity: Not on file  Stress: Not on file  Social Connections: Socially Isolated (11/28/2019)   Social Connection and Isolation Panel [NHANES]    Frequency of Communication with Friends and Family: More than three times a week  Frequency of Social Gatherings with Friends and Family: More than three times a week    Attends Religious Services: Never    Marine scientist or Organizations: No    Attends Music therapist: Not on file    Marital Status: Never married  Human resources officer Violence: Not on file    Family History  Problem Relation Age of Onset   Hypertension Mother    Cancer Father    Hypertension Father    Breast cancer Maternal Aunt 50   Heart attack Other    BP 108/68   Pulse 81   Wt 79.2 kg (174 lb 9.6 oz)   SpO2 95%   BMI 29.05 kg/m   Wt Readings from Last 3 Encounters:  03/13/22 79.2 kg (174 lb 9.6 oz)  03/09/22 79.8 kg (175 lb 14.8 oz)  03/02/22 79.4 kg (175 lb 1.6 oz)   PHYSICAL EXAM: General:  NAD. No resp difficulty HEENT: Normal Neck: Supple. No JVD. Carotids 2+ bilat; no bruits. No lymphadenopathy or thryomegaly appreciated. Cor: PMI nondisplaced. Regular rate & rhythm. No rubs, gallops or murmurs, + mechanical S1 Lungs: Clear Abdomen: Soft, nontender, nondistended. No hepatosplenomegaly. No bruits or masses. Good bowel sounds. Extremities: No cyanosis, clubbing, rash, 1+ BLE edema Neuro: Alert & oriented x 3, cranial nerves grossly intact. Moves all 4 extremities w/o difficulty. Affect pleasant.  ECG (personally reviewed): NSR, RBBB qrs 128 msec, QTc  479 msec  REDs: 39%  ASSESSMENT & PLAN: Chronic combined CHF: - Echo (2017): EF 45-50% in setting of PE. Has mild to moderate RV dysfunction. MV prosthesis okay. - Echo (7/22): EF 35-40% this admit (Dr. Jerilee Field felt EF 45%), previously 55-60% in 2020.  - R/LHC (01/09/21): with 1v CAD mRCA chronically occluded and well compensated hemodynamics. - Echo (08/01/21): EF 50-55% RV moderately down. MVR ok Mean gradient 3  - Admitted 8/23 with AFL with RVR and a/c CHF. - Echo (8/23): EF 35-40%, LV global HK, severe RV dysfunction, mean mitral valve gradient 8 mmHg, severe TR. ? Tachy mediated reduction in EF. - NYHA II, volume status up, REDS 39%. - Restart torsemide 20 mg daily + 20 KCL daily. - Continue spiro 25 mg daily. - Continue Toprol XL 25 mg daily. - Continue Farxiga 10 mg daily. - Now off hydral/Imdur. - H/o angioedema with ACE-I, so no ARNi either. Could consdier ARB next. - BMET, BNP today.Repeat BMET in 10-14 days. - Repeat echo in 6-8 weeks now that she is back in NSR to look for EF improvement.  2. AFL - Recent admit with RVR. - s/p TEE and DCCV 9/23 to NSR. - NSR on ECG today. - She was instructed to stop amio due to long QTc (QTC 410 msec on recent ECG). Minimize QT pro-longing agents - She does not do well in AFL/AF. - Restart amiodarone 200 mg daily. Not ideal with her COPD. Refer to AF clinic to discuss AAD vs ablation options. - Continue warfarin. INR followed by Dr. Elie Confer.  3. CAD  - 1 V CAD with occluded mid RCA on cath (7/22). - No s/s angina. - On beta blocker + statin. No ASA with warfarin use.    4. Hx of mechanical mitral valve replacement: - Mechanical MVR in [place] in 2001. - Echo (7/22) mean gradient of 4 mmHg with no significant MR. - Echo 08/01/21 EF 50-55% RV moderately down. MVR ok Mean gradient 3  - Echo 8/23 mitral valve mean gradient 8 mmHg (was in AFL  and volume overload) - TEE (9/23) poor images due to laryngospasm/airway issues - Continue  Coumadin.  - Continue SBE prophylaxis. - Repeat echo down the road.   5. Tricuspid valve regurgitation - Mild to moderate by echo 3/23 - Severe on recent echo (8/23)   6. Hx DVT/PE - On Coumadin. - INRs followed by Dr. Elie Confer.   7. HTN - Blood pressure well controlled.  - Diurese as above. - Consider addition of losartan next if BP allows.  8. COPD - Continue inhalers. - No wheezes on exam.   9. Smoker: - Stressed need for smoking cessation  Follow up in 3-4 weeks with APP and 12 weeks with Dr. Melynda Keller, FNP 03/13/22

## 2022-03-12 ENCOUNTER — Other Ambulatory Visit: Payer: Self-pay | Admitting: Student

## 2022-03-12 ENCOUNTER — Other Ambulatory Visit: Payer: Self-pay | Admitting: Family Medicine

## 2022-03-12 DIAGNOSIS — Z1231 Encounter for screening mammogram for malignant neoplasm of breast: Secondary | ICD-10-CM

## 2022-03-13 ENCOUNTER — Ambulatory Visit (HOSPITAL_COMMUNITY)
Admit: 2022-03-13 | Discharge: 2022-03-13 | Disposition: A | Discharge status: Home / Self Care | Payer: Medicare Other | Attending: Family Medicine | Admitting: Family Medicine

## 2022-03-13 ENCOUNTER — Encounter (HOSPITAL_COMMUNITY): Payer: Self-pay

## 2022-03-13 VITALS — BP 108/68 | HR 81 | Wt 174.6 lb

## 2022-03-13 DIAGNOSIS — Z86711 Personal history of pulmonary embolism: Secondary | ICD-10-CM | POA: Insufficient documentation

## 2022-03-13 DIAGNOSIS — I10 Essential (primary) hypertension: Secondary | ICD-10-CM | POA: Diagnosis not present

## 2022-03-13 DIAGNOSIS — I4891 Unspecified atrial fibrillation: Secondary | ICD-10-CM | POA: Insufficient documentation

## 2022-03-13 DIAGNOSIS — R000 Tachycardia, unspecified: Secondary | ICD-10-CM | POA: Diagnosis not present

## 2022-03-13 DIAGNOSIS — Z720 Tobacco use: Secondary | ICD-10-CM | POA: Diagnosis not present

## 2022-03-13 DIAGNOSIS — Z7901 Long term (current) use of anticoagulants: Secondary | ICD-10-CM | POA: Diagnosis not present

## 2022-03-13 DIAGNOSIS — I071 Rheumatic tricuspid insufficiency: Secondary | ICD-10-CM

## 2022-03-13 DIAGNOSIS — I110 Hypertensive heart disease with heart failure: Secondary | ICD-10-CM | POA: Insufficient documentation

## 2022-03-13 DIAGNOSIS — I2510 Atherosclerotic heart disease of native coronary artery without angina pectoris: Secondary | ICD-10-CM | POA: Insufficient documentation

## 2022-03-13 DIAGNOSIS — I5022 Chronic systolic (congestive) heart failure: Secondary | ICD-10-CM | POA: Diagnosis not present

## 2022-03-13 DIAGNOSIS — Z923 Personal history of irradiation: Secondary | ICD-10-CM | POA: Insufficient documentation

## 2022-03-13 DIAGNOSIS — Z79899 Other long term (current) drug therapy: Secondary | ICD-10-CM | POA: Insufficient documentation

## 2022-03-13 DIAGNOSIS — I5042 Chronic combined systolic (congestive) and diastolic (congestive) heart failure: Secondary | ICD-10-CM | POA: Insufficient documentation

## 2022-03-13 DIAGNOSIS — F17210 Nicotine dependence, cigarettes, uncomplicated: Secondary | ICD-10-CM | POA: Diagnosis not present

## 2022-03-13 DIAGNOSIS — Z7984 Long term (current) use of oral hypoglycemic drugs: Secondary | ICD-10-CM | POA: Insufficient documentation

## 2022-03-13 DIAGNOSIS — J449 Chronic obstructive pulmonary disease, unspecified: Secondary | ICD-10-CM | POA: Diagnosis not present

## 2022-03-13 DIAGNOSIS — J385 Laryngeal spasm: Secondary | ICD-10-CM | POA: Diagnosis not present

## 2022-03-13 DIAGNOSIS — Z853 Personal history of malignant neoplasm of breast: Secondary | ICD-10-CM | POA: Diagnosis not present

## 2022-03-13 DIAGNOSIS — I081 Rheumatic disorders of both mitral and tricuspid valves: Secondary | ICD-10-CM | POA: Insufficient documentation

## 2022-03-13 DIAGNOSIS — Z9012 Acquired absence of left breast and nipple: Secondary | ICD-10-CM | POA: Insufficient documentation

## 2022-03-13 DIAGNOSIS — Z952 Presence of prosthetic heart valve: Secondary | ICD-10-CM | POA: Diagnosis not present

## 2022-03-13 DIAGNOSIS — Z86718 Personal history of other venous thrombosis and embolism: Secondary | ICD-10-CM | POA: Insufficient documentation

## 2022-03-13 HISTORY — DX: Unspecified atrial fibrillation: I48.91

## 2022-03-13 LAB — BASIC METABOLIC PANEL
Anion gap: 9 (ref 5–15)
BUN: 10 mg/dL (ref 8–23)
CO2: 21 mmol/L — ABNORMAL LOW (ref 22–32)
Calcium: 8.8 mg/dL — ABNORMAL LOW (ref 8.9–10.3)
Chloride: 105 mmol/L (ref 98–111)
Creatinine, Ser: 1.59 mg/dL — ABNORMAL HIGH (ref 0.44–1.00)
GFR, Estimated: 34 mL/min — ABNORMAL LOW (ref >60)
Glucose, Bld: 69 mg/dL — ABNORMAL LOW (ref 70–99)
Potassium: 3.6 mmol/L (ref 3.5–5.1)
Sodium: 135 mmol/L (ref 135–145)

## 2022-03-13 LAB — BRAIN NATRIURETIC PEPTIDE: B Natriuretic Peptide: 721.5 pg/mL — ABNORMAL HIGH (ref 0.0–100.0)

## 2022-03-13 MED ORDER — POTASSIUM CHLORIDE CRYS ER 20 MEQ PO TBCR: 20.0000 meq | EXTENDED_RELEASE_TABLET | Freq: Every day | ORAL | 2 refills | Status: DC

## 2022-03-13 MED ORDER — AMIODARONE HCL 200 MG PO TABS: 200.0000 mg | ORAL_TABLET | Freq: Every day | ORAL | 2 refills | Status: DC

## 2022-03-13 NOTE — Patient Instructions (Addendum)
Thank you for coming in today  Labs were done today, if any labs are abnormal the clinic will call you No news is good news  Reds clip was done in out office today  RESTART Torsemide 20 mg 1 tablet daily  START Potassium 20 meq 1 tablet daily  RESTART Amiodarone 200 mg 1 tablet daily   You have been referred to Afib clinic and they will call you for further appointment details   You have been sent home with at home sleep study and will be contacted to know when to start test at home   Your physician recommends that you schedule a follow-up appointment in:  4 weeks in clinic  12 weeks with Dr. Haroldine Laws     Do the following things EVERYDAY: Weigh yourself in the morning before breakfast. Write it down and keep it in a log. Take your medicines as prescribed Eat low salt foods--Limit salt (sodium) to 2000 mg per day.  Stay as active as you can everyday Limit all fluids for the day to less than 2 liters  At the [facility], you and your health needs are our priority. As part of our continuing mission to provide you with exceptional heart care, we have created designated Provider Care Teams. These Care Teams include your primary Cardiologist (physician) and Advanced Practice Providers (APPs- Physician Assistants and Nurse Practitioners) who all work together to provide you with the care you need, when you need it.   You may see any of the following providers on your designated Care Team at your next follow up: Dr Glori Bickers Dr Loralie Champagne Dr. Roxana Hires, NP Lyda Jester, [place] [facility] [place], [place] Forestine Na, NP Audry Riles, PharmD   Please be sure to bring in all your medications bottles to every appointment.   If you have any questions or concerns before your next appointment please send [place] a message through [place] or call our office at [phone].    TO LEAVE A MESSAGE FOR THE NURSE SELECT OPTION 2, PLEASE LEAVE  A MESSAGE INCLUDING: YOUR NAME DATE OF BIRTH CALL BACK NUMBER REASON FOR CALL**this is important as we prioritize the call backs  YOU WILL RECEIVE A CALL BACK THE SAME DAY AS LONG AS YOU CALL BEFORE 4:00 PM

## 2022-03-13 NOTE — Progress Notes (Signed)
Patient Name: Kim Anthony         DOB: 10-12-1950      Height:  82f 5in     Weight: 174.6 lbs   Office Name:         Referring Provider:  Today's Date: 03/13/2022   STOP BANG RISK ASSESSMENT S (snore) Have you been told that you snore? Yes     YES/NO   T (tired) Are you often tired, fatigued, or sleepy during the day? Yes   YES/NO  O (obstruction) Do you stop breathing, choke, or gasp during sleep? No  YES/NO   P (pressure) Do you have or are you being treated for high blood pressure? Yes  YES/NO   B (BMI) Is your body index greater than 35 kg/m? No  YES/NO   A (age) Are you 586years old or older? Yes  YES/NO   N (neck) Do you have a neck circumference greater than 16 inches? No   YES/NO   G (gender) Are you a female? No  YES/NO   TOTAL STOP/BANG "YES" ANSWERS                                                                        For Office Use Only              Procedure Order Form    YES to 3+ Stop Bang questions OR two clinical symptoms - patient qualifies for WatchPAT (CPT 95800)             Clinical Notes: Will consult Sleep Specialist and refer for management of therapy due to patient increased risk of Sleep Apnea. Ordering a sleep study due to the following two clinical symptoms: Excessive daytime sleepiness G47.10 / Gastroesophageal reflux K21.9 / Nocturia R35.1 / Morning Headaches G44.221 / Difficulty concentrating R41.840 / Memory problems or poor judgment G31.84 / Personality changes or irritability R45.4 / Loud snoring R06.83 / Depression F32.9 / Unrefreshed by sleep G47.8 / Impotence N52.9 / History of high blood pressure R03.0 / Insomnia G47.00    I understand that I am proceeding with a home sleep apnea test as ordered by my treating physician. I understand that untreated sleep apnea is a serious cardiovascular risk factor and it is my responsibility to perform the test and seek management for sleep apnea. I will be contacted with the results and be managed for  sleep apnea by a local sleep physician. I will be receiving equipment and further instructions from [facility] I shall promptly ship back the equipment via the included mailing label. I understand my insurance will be billed for the test and as the patient I am responsible for any insurance related out-of-pocket costs incurred. I have been provided with written instructions and can call for additional video or telephonic instruction, with 24-hour availability of qualified personnel to answer any questions: Patient Help Desk [phone]  Patient Signature ______________________________________________________   Date______________________ Patient Telemedicine Verbal Consent

## 2022-03-13 NOTE — Progress Notes (Signed)
ReDS Vest / Clip - 03/13/22 1500       ReDS Vest / Clip   Station Marker A    Ruler Value 23.5    ReDS Value Range Moderate volume overload    ReDS Actual Value 39

## 2022-03-16 ENCOUNTER — Encounter: Payer: Self-pay | Admitting: Physical Medicine and Rehabilitation

## 2022-03-16 ENCOUNTER — Telehealth: Payer: Self-pay | Admitting: Pharmacist

## 2022-03-16 ENCOUNTER — Encounter
Payer: Medicare Other | Attending: Physical Medicine and Rehabilitation | Admitting: Physical Medicine and Rehabilitation

## 2022-03-16 VITALS — BP 116/77 | HR 73 | Ht 65.0 in | Wt 175.0 lb

## 2022-03-16 DIAGNOSIS — G894 Chronic pain syndrome: Secondary | ICD-10-CM | POA: Diagnosis not present

## 2022-03-16 DIAGNOSIS — M5416 Radiculopathy, lumbar region: Secondary | ICD-10-CM | POA: Insufficient documentation

## 2022-03-16 DIAGNOSIS — Z5181 Encounter for therapeutic drug level monitoring: Secondary | ICD-10-CM | POA: Diagnosis not present

## 2022-03-16 DIAGNOSIS — Z79891 Long term (current) use of opiate analgesic: Secondary | ICD-10-CM | POA: Diagnosis not present

## 2022-03-16 MED ORDER — DULOXETINE HCL 20 MG PO CPEP: 20.0000 mg | ORAL_CAPSULE | Freq: Every day | ORAL | 3 refills | Status: DC

## 2022-03-16 MED ORDER — LORAZEPAM 1 MG PO TABS: 1.0000 mg | ORAL_TABLET | Freq: Once | ORAL | 0 refills | Status: AC

## 2022-03-16 MED ORDER — HYDROCODONE-ACETAMINOPHEN 5-325 MG PO TABS: 1.0000 | ORAL_TABLET | ORAL | 0 refills | Status: DC | PRN

## 2022-03-16 NOTE — Patient Instructions (Signed)
Patient is a 71 yr old female with aortic stenosis s/p AVR on Coumadin,  HTN, hx of PE's on chronic coumadin , CHF, and COPD- here for f/u of R side low back  Pain with sciatica. Due to get ECHO for CHF- still needs to get this.  Tramadol makes her ill as well as Tylenol #3 and Darvocet.   Will try to get MRI for lumbar radiculopathy- so we can try to get epidural steroid injection by Dr Letta Pate- once we get MRI, weill determine if can do ESI.   2. Needs Ativan for MRI- will order 1 mg x1 for 1 hour prior to MRI- cannot drive self to /from MRI.    3. Refill Norco 5/325 mg-is due 10/5- will send in today to have at pharmacy-  pt asking for higher dose, but want to help nerve pain first and need Oral drug screen today since haven't seen in 7 months.   4. Will start Cymbalta Start Duloxetine/Cymbalta 20 mg nightly x 1 week Then 40 mg nightly x 1 week  Then 60 mg nightly- for nerve pain 1% of patients can have nausea with Duloxetine- call me if needs an anti-nausea medicine.Nausea usually only lasts 7 days.  Can also cause mild dry mouth/dry eyes and mild constipation. 5. Oral drug screen today-   6. F/U in 3 months. Chronic pain.

## 2022-03-16 NOTE — Telephone Encounter (Signed)
Received fax results from [facility] on 25-SEP-23 from a patient self testing, finger-stick, point-of-care INR she performed on 18-SEP-23 at 2:55 PM. Results were 2.5 on '30mg'$  warfarin/wk. Range (target) is 2.5 - 3.5. Will advise the patient's daughter [name]) to fill her mom's pill box to reflect one (1) '5mg'$  peach-colored warfarin tablet ALL DAYS of the WEEK--EXCEPT on [facility], give only one-half (1/2) of the '5mg'$  peach-colored warfarin tablet on Wednesdays. Repeat patient self-testing, finger-stick, point of care testing (INR) on Monday, March 30, 2022 and call results to me.  Jorene Guest, PharmD, CPP

## 2022-03-16 NOTE — Telephone Encounter (Deleted)
After clarifying with the patient's daughter, Kim Anthony--the patient has been take only ONE-HALF (1/2) of the '5mg'$  stregnth peach-colored warfarin tablet daily, i.e. she has been taking 2.5 milligrams daily. Will INCREASE dose by approximately 12% to 22.'5mg'$  warfarin per week as:  1/2 tablet (2.'5mg'$ ) on Su/Mo/We/Th/Sa; on TUESDAYS and FRIDAYS, take one (1) tablet.

## 2022-03-16 NOTE — Progress Notes (Signed)
Subjective:    Patient ID: Kim Anthony, female    DOB: [birth year], 71 y.o.   [name]: [id number]  HPI  Patient is a 71 yr old female with aortic stenosis s/p AVR on Coumadin,  HTN, hx of PE's on chronic coumadin , CHF, and COPD- here for f/u of R side low back  Pain with sciatica. Due to get ECHO for CHF- still needs to get this.  Tramadol makes her ill as well as Tylenol #3 and Darvocet. Here for f/u on chronic pain.   Average pain 8/10- gets Norco 5/325 mg ~5x/day-  With pain meds, is 8/10-  Still having L>R knee pain-  Got fluid out of knee, and got injection last month mid august 2023.  Michela Pitcher was told by Ortho that "she should increase her pain meds to 10/325 mg) however no notation of that was in their note.   They talked about MRI- and got it done- but cannot see in computer about possible ortho surgery on knee and possible removal of meniscus if torn.    Has been in hospital since "couldn't breathe well". And feet swelling- was in hospital for acute CHF.    R foot is more swollen than L; but L ankle more swollen.   Was told by Cards to take water pills- Lasix- but isn't on current med list-    Feels like pain getting worse - on R low back-  Doesn't think has had an MRI of spine.   Haven't seen pt in 7 months since was in hospital a few times and missed appt.  Taking Norco 5x/day-  Pain is more like burning pain in R low back and goes down below R knee.  Hasn't had epidural steroid injection of back.     Is already on gabapentin 900 mg TID.   Pain Inventory Average Pain 8 Pain Right Now 9 My pain is intermittent, sharp, burning, and aching  In the last 24 hours, has pain interfered with the following? General activity 10 Relation with others 0 Enjoyment of life 0 What TIME of day is your pain at its worst? morning , daytime, evening, night, and varies Sleep (in general) Fair  Pain is worse with: walking, sitting, standing, and some activites Pain improves  with: medication Relief from Meds:  a little  Family History  Problem Relation Age of Onset   Hypertension Mother    Cancer Father    Hypertension Father    Breast cancer Maternal Aunt 50   Heart attack Other    Social History   Socioeconomic History   Marital status: Single    Spouse name: Not on file   Number of children: Not on file   Years of education: Not on file   Highest education level: Not on file  Occupational History   Not on file  Tobacco Use   Smoking status: Every Day    Packs/day: 0.10    Years: 30.00    Total pack years: 3.00    Types: Cigarettes   Smokeless tobacco: Never   Tobacco comments:    5 cigs per day  Vaping Use   Vaping Use: Never used  Substance and Sexual Activity   Alcohol use: Yes    Alcohol/week: 3.0 standard drinks of alcohol    Types: 3 Standard drinks or equivalent per week    Comment: beer- ocasional   Drug use: No   Sexual activity: Not Currently    Birth control/protection: None  Other Topics  Concern   Not on file  Social History Narrative   ** Merged History Encounter **       Social Determinants of Health   Financial Resource Strain: Medium Risk (02/27/2021)   Overall Financial Resource Strain (CARDIA)    Difficulty of Paying Living Expenses: Somewhat hard  Food Insecurity: No Food Insecurity (02/24/2022)   Hunger Vital Sign    Worried About Running Out of Food in the Last Year: Never true    Ran Out of Food in the Last Year: Never true  Transportation Needs: No Transportation Needs (03/06/2022)   PRAPARE - Hydrologist (Medical): No    Lack of Transportation (Non-Medical): No  Physical Activity: Not on file  Stress: Not on file  Social Connections: Socially Isolated (11/28/2019)   Social Connection and Isolation Panel [NHANES]    Frequency of Communication with Friends and Family: More than three times a week    Frequency of Social Gatherings with Friends and Family: More than three times a  week    Attends Religious Services: Never    Marine scientist or Organizations: No    Attends Music therapist: Not on file    Marital Status: Never married   Past Surgical History:  Procedure Laterality Date   APPLICATION OF A-CELL OF CHEST/ABDOMEN Left 01/27/2019   Procedure: APPLICATION OF A-CELL OF CHEST;  Surgeon: Wallace Going, DO;  Location: [place];  Service: Plastics;  Laterality: Left;   APPLICATION OF WOUND VAC N/A 01/27/2019   Procedure: APPLICATION OF WOUND VAC;  Surgeon: Wallace Going, DO;  Location: [place];  Service: Plastics;  Laterality: N/A;   BREAST SURGERY Left 2001   for CA   CARDIOVERSION N/A 02/26/2022   Procedure: CARDIOVERSION;  Surgeon: Donato Heinz, MD;  Location: [place];  Service: Cardiovascular;  Laterality: N/A;   COLONOSCOPY WITH PROPOFOL N/A 06/07/2018   Procedure: COLONOSCOPY WITH PROPOFOL;  Surgeon: Wilford Corner, MD;  [facility];  Service: Endoscopy;  Laterality: N/A;   DEBRIDEMENT AND CLOSURE WOUND Left 12/28/2018   Procedure: Debridement And Closure Wound;  Surgeon: Coralie Keens, MD;  Location: [place];  Service: General;  Laterality: Left;   INCISION AND DRAINAGE OF WOUND Left 01/27/2019   Procedure: IRRIGATION AND DEBRIDEMENT OF CHEST WALL;  Surgeon: Wallace Going, DO;  Location: [place];  Service: Plastics;  Laterality: Left;   KNEE ARTHROSCOPY WITH MEDIAL MENISECTOMY Left 08/06/2021   Procedure: KNEE ARTHROSCOPY WITH PARTIAL MEDIAL MENISECTOMY, DEBRIDEMENT;  Surgeon: Susa Day, MD;  [facility];  Service: Orthopedics;  Laterality: Left;  1 HR   LATISSIMUS FLAP TO BREAST Left 01/18/2019   Procedure: LEFT LATISSIMUS FLAP TO BREAST;  Surgeon: Wallace Going, DO;  Location: [place];  Service: Plastics;  Laterality: Left;   MITRAL VALVE REPLACEMENT     mechanical   POLYPECTOMY  06/07/2018   Procedure: POLYPECTOMY;  Surgeon: Wilford Corner, MD;  [facility];   Service: Endoscopy;;   RIGHT/LEFT HEART CATH AND CORONARY ANGIOGRAPHY N/A 01/09/2021   Procedure: RIGHT/LEFT HEART CATH AND CORONARY ANGIOGRAPHY;  Surgeon: Jolaine Artist, MD;  Location: [facility];  Service: Cardiovascular;  Laterality: N/A;   WOUND DEBRIDEMENT Left 12/28/2018   Procedure: EXCISION OF LEFT CHRONIC CHEST WALL WOUND;  Surgeon: Coralie Keens, MD;  Location: [place];  Service: General;  Laterality: Left;   Past Surgical History:  Procedure Laterality Date   APPLICATION OF A-CELL OF CHEST/ABDOMEN Left  01/27/2019   Procedure: APPLICATION OF A-CELL OF CHEST;  Surgeon: Wallace Going, DO;  Location: [place];  Service: Plastics;  Laterality: Left;   APPLICATION OF WOUND VAC N/A 01/27/2019   Procedure: APPLICATION OF WOUND VAC;  Surgeon: Wallace Going, DO;  Location: [place];  Service: Plastics;  Laterality: N/A;   BREAST SURGERY Left 2001   for CA   CARDIOVERSION N/A 02/26/2022   Procedure: CARDIOVERSION;  Surgeon: Donato Heinz, MD;  Location: [place];  Service: Cardiovascular;  Laterality: N/A;   COLONOSCOPY WITH PROPOFOL N/A 06/07/2018   Procedure: COLONOSCOPY WITH PROPOFOL;  Surgeon: Wilford Corner, MD;  [facility];  Service: Endoscopy;  Laterality: N/A;   DEBRIDEMENT AND CLOSURE WOUND Left 12/28/2018   Procedure: Debridement And Closure Wound;  Surgeon: Coralie Keens, MD;  Location: [place];  Service: General;  Laterality: Left;   INCISION AND DRAINAGE OF WOUND Left 01/27/2019   Procedure: IRRIGATION AND DEBRIDEMENT OF CHEST WALL;  Surgeon: Wallace Going, DO;  Location: [place];  Service: Plastics;  Laterality: Left;   KNEE ARTHROSCOPY WITH MEDIAL MENISECTOMY Left 08/06/2021   Procedure: KNEE ARTHROSCOPY WITH PARTIAL MEDIAL MENISECTOMY, DEBRIDEMENT;  Surgeon: Susa Day, MD;  [facility];  Service: Orthopedics;  Laterality: Left;  1 HR   LATISSIMUS FLAP TO BREAST Left 01/18/2019   Procedure: LEFT LATISSIMUS FLAP  TO BREAST;  Surgeon: Wallace Going, DO;  Location: [place];  Service: Plastics;  Laterality: Left;   MITRAL VALVE REPLACEMENT     mechanical   POLYPECTOMY  06/07/2018   Procedure: POLYPECTOMY;  Surgeon: Wilford Corner, MD;  [facility];  Service: Endoscopy;;   RIGHT/LEFT HEART CATH AND CORONARY ANGIOGRAPHY N/A 01/09/2021   Procedure: RIGHT/LEFT HEART CATH AND CORONARY ANGIOGRAPHY;  Surgeon: Jolaine Artist, MD;  Location: [facility] [facility];  Service: Cardiovascular;  Laterality: N/A;   WOUND DEBRIDEMENT Left 12/28/2018   Procedure: EXCISION OF LEFT CHRONIC CHEST WALL WOUND;  Surgeon: Coralie Keens, MD;  Location: [place];  Service: General;  Laterality: Left;   Past Medical History:  Diagnosis Date   Arthritis    Asthma    Breast cancer ([facility]) 2001   Left Breast Cancer   CHF (congestive heart failure) (HCC)    COPD (chronic obstructive pulmonary disease) ([place])    Coronary artery disease    GERD (gastroesophageal reflux disease)    History of mitral valve replacement with mechanical valve    HLD (hyperlipidemia)    Hypertension    Pulmonary embolism ([place]) 2021   Ht '5\' 5"'$  (1.651 m)   Wt 175 lb (79.4 kg)   BMI 29.12 kg/m   Opioid Risk Score:   Fall Risk Score:  `1  Depression screen [facility] 2/9     01/13/2022   11:32 AM 01/09/2022    9:02 AM 11/20/2021    8:51 AM 11/13/2021    9:14 AM 11/04/2021    2:55 PM 07/25/2021   10:14 AM 04/23/2021   10:38 AM  Depression screen [facility] 2/9  Decreased Interest 0 0 0 0 0 0 0  Down, Depressed, Hopeless 0 0 0 0 0 0 0  PHQ - 2 Score 0 0 0 0 0 0 0    Review of Systems  Cardiovascular:  Positive for leg swelling.       Left ankle swelling  Musculoskeletal:  Positive for back pain and gait problem.       Pain in both knees  All other systems reviewed and  are negative.     Objective:   Physical Exam  Awake, alert, appropriate, using quad cane to get around, NAD MS:  Hard to stand- took 2x to get to standing- lumbar  flexion noted LE's RLE- HF 4+/5; KE 5-/5; DF and PF 5-/5  Negative SLR  TTP over L4/S1 on R side- not midline Associated tightness Lumbar extension makes it worse than lumbar flexion  Also has swelling more L side of face And L ankle looks a little erythematous- R foot 2-3+ swelling and L foot 2+ swelling- slightly less than R side       Assessment & Plan:   Patient is a 71 yr old female with aortic stenosis s/p AVR on Coumadin,  HTN, hx of PE's on chronic coumadin , CHF, and COPD- here for f/u of R side low back  Pain with sciatica. Due to get ECHO for CHF- still needs to get this.  Tramadol makes her ill as well as Tylenol #3 and Darvocet.   Will try to get MRI for lumbar radiculopathy- so we can try to get epidural steroid injection by Dr Letta Pate- once we get MRI, weill determine if can do ESI.   2. Needs Ativan for MRI- will order 1 mg x1 for 1 hour prior to MRI- cannot drive self to /from MRI.    3. Refill Norco 5/325 mg-is due 10/5- will send in today to have at pharmacy-  pt asking for higher dose, but want to help nerve pain first and need Oral drug screen today since haven't seen in 7 months.   4. Will start Cymbalta Start Duloxetine/Cymbalta 20 mg nightly x 1 week Then 40 mg nightly x 1 week  Then 60 mg nightly- for nerve pain 1% of patients can have nausea with Duloxetine- call me if needs an anti-nausea medicine.Nausea usually only lasts 7 days.  Can also cause mild dry mouth/dry eyes and mild constipation. 5. Oral drug screen today-   6. F/U in 3 months. Chronic pain.    I spent a total of 31   minutes on total care today- >50% coordination of care- due to d/w pt about MRI, ESI's and nerve pain.

## 2022-03-16 NOTE — Telephone Encounter (Signed)
Will INCREASE dose by approximately 12% to 22.'5mg'$  warfarin per week as:  1/2 tablet (2.'5mg'$ ) on Su/Mo/We/Th/Sa; on TUESDAYS and FRIDAYS, take one (1) tablet.

## 2022-03-16 NOTE — Addendum Note (Signed)
Addended by: Franchot Gallo on: 03/16/2022 11:39 AM   Modules accepted: Orders

## 2022-03-19 ENCOUNTER — Ambulatory Visit: Payer: Medicare Other | Admitting: Audiologist

## 2022-03-19 NOTE — Telephone Encounter (Signed)
Evaluation and management procedures were performed by the Clinical Pharmacy Practitioner under my supervision and collaboration. I have reviewed the Practitioner's note and chart, and I agree with the management and plan as documented above. ° °

## 2022-03-20 ENCOUNTER — Telehealth: Payer: Self-pay | Admitting: *Deleted

## 2022-03-20 NOTE — Telephone Encounter (Signed)
Received on (03/18/2022) via of fax DME Standard Written Order from [facility].  Requesting signature,date,and return.  Given to provider to sign and return.  DME Standard Written Order signed,dated,and faxed back to [facility].  Confirmation received and copy scanned into the chart.//AB/CMA

## 2022-03-24 ENCOUNTER — Telehealth: Payer: Self-pay

## 2022-03-24 MED ORDER — HYDROCODONE-ACETAMINOPHEN 5-325 MG PO TABS: 1.0000 | ORAL_TABLET | ORAL | 0 refills | Status: DC | PRN

## 2022-03-24 NOTE — Telephone Encounter (Signed)
PMP:  Filled  Written  ID  Drug  QTY  Days  Prescriber  RX #  Dispenser  Refill  Daily Dose*  Pymt Type  PMP  03/16/2022 03/16/2022 1  Lorazepam 1 Mg Tablet 1.00 1 Me Lov [id number] [facility] (0865) 0/0 1.00 LME Medicare [place] 02/25/2022 02/25/2022 1  Hydrocodone-Acetamin 5-325 Mg 150.00 25 Me Lov [id number] [facility] ([id number]) 0/0 30.00 MME Medicare [place]

## 2022-03-26 ENCOUNTER — Ambulatory Visit: Payer: Self-pay

## 2022-03-26 ENCOUNTER — Ambulatory Visit (HOSPITAL_COMMUNITY)
Admission: RE | Admit: 2022-03-26 | Discharge: 2022-03-26 | Disposition: A | Discharge status: Home / Self Care | Payer: Medicare Other | Source: Ambulatory Visit | Attending: Internal Medicine | Admitting: Internal Medicine

## 2022-03-26 DIAGNOSIS — I5022 Chronic systolic (congestive) heart failure: Secondary | ICD-10-CM | POA: Diagnosis not present

## 2022-03-26 LAB — BASIC METABOLIC PANEL
Anion gap: 12 (ref 5–15)
BUN: 12 mg/dL (ref 8–23)
CO2: 22 mmol/L (ref 22–32)
Calcium: 9 mg/dL (ref 8.9–10.3)
Chloride: 98 mmol/L (ref 98–111)
Creatinine, Ser: 1.17 mg/dL — ABNORMAL HIGH (ref 0.44–1.00)
GFR, Estimated: 50 mL/min — ABNORMAL LOW (ref >60)
Glucose, Bld: 96 mg/dL (ref 70–99)
Potassium: 3.1 mmol/L — ABNORMAL LOW (ref 3.5–5.1)
Sodium: 132 mmol/L — ABNORMAL LOW (ref 135–145)

## 2022-03-26 NOTE — Patient Outreach (Signed)
  Care Coordination   Initial Visit Note   03/26/2022 Name: Kim Anthony [name]: [id number] DOB: [birth year]  Kim Anthony is a 71 y.o. year old female who sees Iona Coach, MD for primary care. I spoke with  Shaune Pascal by phone today.  What matters to the patients health and wellness today?  I am out and would like to reschedule    Goals Addressed   None     SDOH assessments and interventions completed:  No     Care Coordination Interventions Activated:  No  Care Coordination Interventions:  No, not indicated   Follow up plan: Follow up call scheduled for 10/6/115 pm    Encounter Outcome:  Pt. Visit Completed   Lazaro Arms RN, BSN, [facility]   Phone: [phone]

## 2022-03-27 ENCOUNTER — Telehealth (HOSPITAL_COMMUNITY): Payer: Self-pay

## 2022-03-27 ENCOUNTER — Ambulatory Visit: Payer: Self-pay

## 2022-03-27 NOTE — Telephone Encounter (Signed)
Patient advised and verbalized understanding. Patient states that she has not been taking K daily. Patient has pending appt with the afib clinic on 10/12 will check bmet at that time.

## 2022-03-27 NOTE — Telephone Encounter (Signed)
-----   Message from Rafael Bihari, Platter sent at 03/27/2022  3:43 PM EDT ----- K is low. Has she been consistently taking KCL 20 daily? If so, take 40 KCL x 1 today (extra) and tomorrow, increase to 40 daily.  Repeat BMET in 1 week

## 2022-03-27 NOTE — Patient Outreach (Signed)
  Care Coordination   Initial Visit Note   03/27/2022 Name: Kim Anthony [name]: [id number] DOB: 05-Mar-1951  Kim Anthony is a 71 y.o. year old Kim who sees Kim Coach, MD for primary care. I spoke with  Kim Anthony by phone today.  What matters to the patients health and wellness today?  I feel good today.    Goals Addressed             This Visit's Progress    Monitor and Manage my CHF       Care Coordination Interventions: Provided education on low sodium diet Reviewed Heart Failure Action Plan in depth and provided written copy Discussed importance of daily weight and advised patient to weigh and record daily Reviewed role of diuretics in prevention of fluid overload and management of heart failure; Active listening / Reflection utilized  Problem Solving [place] reviewed I had a conversation with Kim Anthony today regarding her congestive heart failure. She informed me that she is taking her medications, but she's not weighing herself daily as advised. I explained to her the importance of daily weight checks and the reasons behind them. Kim Anthony denied experiencing any chest pain, shortness of breath, or swelling. She reported feeling good today.         SDOH assessments and interventions completed:  Yes  SDOH Interventions Today    Flowsheet Row Most Recent Value  SDOH Interventions   Food Insecurity Interventions Intervention Not Indicated  Transportation Interventions Intervention Not Indicated        Care Coordination Interventions Activated:  Yes  Care Coordination Interventions:  Yes, provided   Follow up plan: Follow up call scheduled for 11/9 3pm    Encounter Outcome:  Pt. Visit Completed   Lazaro Arms RN, BSN, [facility]   Phone: [phone]

## 2022-03-27 NOTE — Patient Instructions (Signed)
Visit Information  Thank you for taking time to visit with me today. Please don't hesitate to contact me if I can be of assistance to you.   Following are the goals we discussed today:   Goals Addressed             This Visit's Progress    Monitor and Manage my CHF       Care Coordination Interventions: Provided education on low sodium diet Reviewed Heart Failure Action Plan in depth and provided written copy Discussed importance of daily weight and advised patient to weigh and record daily Reviewed role of diuretics in prevention of fluid overload and management of heart failure; Active listening / Reflection utilized  Problem Solving [place] reviewed I had a conversation with Mrs. Mcquary today regarding her congestive heart failure. She informed me that she is taking her medications, but she's not weighing herself daily as advised. I explained to her the importance of daily weight checks and the reasons behind them. Mrs. Lennox denied experiencing any chest pain, shortness of breath, or swelling. She reported feeling good today.         Our next appointment is by telephone on 11/9 at 3 pm  Please call the care guide team at [phone] if you need to cancel or reschedule your appointment.   If you are experiencing a Mental Health or [place] or need someone to talk to, please call 1-800-273-TALK (toll free, 24 hour hotline)  Patient verbalizes understanding of instructions and care plan provided today and agrees to view in [place]. Active MyChart status and patient understanding of how to access instructions and care plan via MyChart confirmed with patient.     Lazaro Arms RN, BSN, [facility]   Phone: [phone]

## 2022-03-28 ENCOUNTER — Ambulatory Visit (HOSPITAL_COMMUNITY): Payer: Medicare Other

## 2022-03-30 ENCOUNTER — Telehealth: Payer: Self-pay | Admitting: Student

## 2022-03-30 ENCOUNTER — Ambulatory Visit: Payer: Self-pay

## 2022-03-30 ENCOUNTER — Ambulatory Visit: Payer: Medicare Other

## 2022-03-30 ENCOUNTER — Encounter: Payer: Medicare Other | Admitting: Student

## 2022-03-30 NOTE — Telephone Encounter (Signed)
Received from Lamoni with [facility] that patient's INR was elevated to 6.5. Called patient who stated she she was asked to check her INR since it had not been checked for a while. Patient denies any dizziness, SOB or headaches but states she has been having some vaginal bleeding for 2 weeks now with some associated abdominal cramping. She had menopause around age 71 and had not have any vaginal bleeding until 2 weeks ago. Patient is currently on warfarin 2.5 mg daily. On chart review, she was started on Amiodarone last month and Amiodarone has been found to decrease the metabolism of warfarin and thus can lead to increased bleeding and elevated INRs in patients on warfarin. I do not think patient needs to come to the hospital tonight but she will need to be seen in the clinic ASAP. Patient advised to hold today and tomorrow's dose of warfarin and instructed to call the clinic tomorrow morning for an appointment. I have also messaged the front desk for them to call her so she can be evaluated in person.

## 2022-03-31 ENCOUNTER — Telehealth: Payer: Self-pay | Admitting: *Deleted

## 2022-03-31 NOTE — Telephone Encounter (Signed)
Ok to wait until next week unless have symptoms of low hemoglobin (chest pain, shortness of breath, lightheadedness, feeling like she might pass out). Thanks.

## 2022-03-31 NOTE — Telephone Encounter (Addendum)
Called pt to inform pt the doctor stated it's ok to wait until next week to schedule an appt unless she develops cp, sob, lightheadedness also increase bleeding and if she does to go to the ER. Stated she understands. Call transferred to front office - appt schedule with Dr Jeanett Schlein 10/19 @ 1015 AM.

## 2022-03-31 NOTE — Telephone Encounter (Signed)
Call from pt c/o vaginal bleeding x 2 week; states it like having a normal period; she has to wear pads.No available appts untiil next week 10/19.Please advise.

## 2022-04-01 ENCOUNTER — Ambulatory Visit (INDEPENDENT_AMBULATORY_CARE_PROVIDER_SITE_OTHER): Payer: Medicare Other | Admitting: Pharmacist

## 2022-04-01 ENCOUNTER — Telehealth: Payer: Self-pay | Admitting: Pharmacist

## 2022-04-01 ENCOUNTER — Ambulatory Visit (INDEPENDENT_AMBULATORY_CARE_PROVIDER_SITE_OTHER): Payer: Medicare Other

## 2022-04-01 DIAGNOSIS — Z7901 Long term (current) use of anticoagulants: Secondary | ICD-10-CM | POA: Diagnosis not present

## 2022-04-01 DIAGNOSIS — I2609 Other pulmonary embolism with acute cor pulmonale: Secondary | ICD-10-CM

## 2022-04-01 DIAGNOSIS — N939 Abnormal uterine and vaginal bleeding, unspecified: Secondary | ICD-10-CM | POA: Insufficient documentation

## 2022-04-01 HISTORY — DX: Abnormal uterine and vaginal bleeding, unspecified: N93.9

## 2022-04-01 LAB — IRON AND TIBC
Iron: 57 ug/dL (ref 28–170)
Saturation Ratios: 14 % (ref 10.4–31.8)
TIBC: 421 ug/dL (ref 250–450)
UIBC: 364 ug/dL

## 2022-04-01 LAB — CBC
HCT: 44.9 % (ref 36.0–46.0)
Hemoglobin: 14.2 g/dL (ref 12.0–15.0)
MCH: 29.2 pg (ref 26.0–34.0)
MCHC: 31.6 g/dL (ref 30.0–36.0)
MCV: 92.2 fL (ref 80.0–100.0)
Platelets: 511 10*3/uL — ABNORMAL HIGH (ref 150–400)
RBC: 4.87 MIL/uL (ref 3.87–5.11)
RDW: 15.9 % — ABNORMAL HIGH (ref 11.5–15.5)
WBC: 6.3 10*3/uL (ref 4.0–10.5)
nRBC: 0 % (ref 0.0–0.2)

## 2022-04-01 LAB — FERRITIN: Ferritin: 32 ng/mL (ref 11–307)

## 2022-04-01 LAB — POCT INR: INR: 4.5 — AB (ref 2.0–3.0)

## 2022-04-01 MED ORDER — WARFARIN SODIUM 2.5 MG PO TABS: 2.5000 mg | ORAL_TABLET | Freq: Every day | ORAL | 1 refills | Status: DC

## 2022-04-01 NOTE — Assessment & Plan Note (Signed)
Patient presents with 2 weeks of vaginal bleeding. Patient states that she has been using 3X pads per day and that these pads are not soaked in blood but have enough to need to be replaced. Patient has noticed intermittent lightheadedness over the last week associated with exertion. Patient also endorses 1 day of pelvic pain and right-sided lower back pain that is new. Patient denies bleeding elsewhere, recent falls or trauma, HA, SOB, syncope, dysuria, frequency. Patient underwent menopause around age 71 and has not had any vaginal bleeding until 2 weeks ago. Patient denies having prior hysterectomy. Patients states that she remembers having fibroids in the past but denies having previous treatment for them.  Patient is currently taking warfarin 5 MG for her a-fib. She was started on amiodarone last month which can decrease the metabolism of warfarin, resulting in increased bleeding risk. INR is elevated at 4.2 today. Patient discussed with Dr. Elie Confer who follows her INR. Patient was instructed by Dr. Elie Confer to hold her warfarin for the last two days.   Plan: - CBC and iron studies ordered - TVUS ordered to further assess the cause of her bleeding - Will hold warfarin today and tomorrow and start warfarin back at 2.5 mg on 10/13 - We will see the patient back in clinic on 10/16 to reassess

## 2022-04-01 NOTE — Patient Instructions (Addendum)
Thank you for coming to see [place] in clinic Ms. Huizinga.   Plan: - We will have you stop taking warfarin for today (October 11th) and tomorrow (October 12th) - We will have you start taking warfarin 2.5 mg starting on Friday (October 13th): this is less than your 5 mg dose that you had been taking - We will see you in clinic on Monday October 16th - We ordered an ultrasound to look at your uterus (you will be called about this)  It was very nice to meet you.

## 2022-04-01 NOTE — Progress Notes (Signed)
CC: vaginal bleeding  HPI:  [name] is a 71 y.o. female with past medical history of HTN, HLD, HFrEF, PE, TAA, a-fib (on warfarin 5 MG), and COPD that presents for vaginal bleeding.   Patients presents with 2 weeks of vaginal bleeding. Patient states that she has been using 3X pads per day and that these pads are not soaked in blood but have enough to need to be replaced. Patient has noticed intermittent lightheadedness over the last week associated with exertion. Patient also endorses 1 day of pelvic pain and right-sided lower back pain that is new. Patient denies bleeding elsewhere, recent falls or trauma, HA, SOB, syncope, dysuria, frequency. Patient underwent menopause around age 11 and had not had any vaginal bleeding until 2 weeks ago. Patient denies having prior hysterectomy. Patients states that she remembers having fibroids in the past but denies having previous treatment for them. Patient is currently taking warfarin 5 MG for her a-fib.   Allergies as of 04/01/2022       Reactions   Lisinopril Swelling   Acetaminophen-codeine Itching   Propoxyphene Itching   Darvocet   Tape Rash   Plastic   Tramadol Itching, Nausea And Vomiting, Rash        Medication List        Accurate as of April 01, 2022  8:54 AM. If you have any questions, ask your nurse or doctor.          albuterol 108 (90 Base) MCG/ACT inhaler Commonly known as: VENTOLIN HFA Inhale 2 puffs into the lungs every 6 (six) hours as needed for wheezing or shortness of breath.   amiodarone 200 MG tablet Commonly known as: PACERONE Take 1 tablet (200 mg total) by mouth daily.   BreatheRite Coll Spacer Adult Misc 1 applicator by Does not apply route daily.   diphenhydrAMINE 25 MG tablet Commonly known as: BENADRYL Take 75 mg by mouth daily as needed for allergies.   DULoxetine 20 MG capsule Commonly known as: Cymbalta Take 1 capsule (20 mg total) by mouth at bedtime. X 1 week, then 40 mg  nightly x 1 week, then 60 mg nightly- for nerve pain   famotidine 20 MG tablet Commonly known as: PEPCID Take 1 tablet (20 mg total) by mouth 2 (two) times daily.   Farxiga 10 MG Tabs tablet Generic drug: dapagliflozin propanediol Take 1 tablet by mouth once daily   fluticasone 50 MCG/ACT nasal spray Commonly known as: Flonase Place 1 spray into both nostrils daily as needed for allergies.   gabapentin 600 MG tablet Commonly known as: NEURONTIN Take 1.5 tablets (900 mg total) by mouth 3 (three) times daily. Take 1 and 1/2 tablets by mouth three times a day for nerve pain   GAS-X PO Take 1 tablet by mouth daily.   HYDROcodone-acetaminophen 5-325 MG tablet Commonly known as: Norco Take 1 tablet by mouth every 4 (four) hours as needed for moderate pain.   metoprolol succinate 25 MG 24 hr tablet Commonly known as: TOPROL-XL Take 1 tablet (25 mg total) by mouth daily.   montelukast 10 MG tablet Commonly known as: Singulair Take 1 tablet (10 mg total) by mouth at bedtime. What changed: additional instructions   potassium chloride SA 20 MEQ tablet Commonly known as: KLOR-CON M Take 1 tablet (20 mEq total) by mouth daily.   simvastatin 20 MG tablet Commonly known as: ZOCOR Take 1 tablet (20 mg total) by mouth daily.   spironolactone 25 MG tablet Commonly known  as: ALDACTONE Take 1 tablet by mouth once daily   Stiolto Respimat 2.5-2.5 MCG/ACT Aers Generic drug: Tiotropium Bromide-Olodaterol Inhale 2 puffs by mouth daily   warfarin 5 MG tablet Commonly known as: COUMADIN Take as directed by the anticoagulation clinic. If you are unsure how to take this medication, talk to your nurse or doctor. Original instructions: Take 0.5 tablets (2.5 mg total) by mouth daily at 4 PM for 30 doses.         Past Medical History:  Diagnosis Date   Arthritis    Asthma    Breast cancer ([place]) 2001   Left Breast Cancer   CHF (congestive heart failure) (HCC)    COPD (chronic  obstructive pulmonary disease) (HCC)    Coronary artery disease    GERD (gastroesophageal reflux disease)    History of mitral valve replacement with mechanical valve    HLD (hyperlipidemia)    Hypertension    Pulmonary embolism ([place]) 2021   Review of Systems:  per HPI.   Physical Exam: Vitals:   04/01/22 1136  BP: 115/71  Pulse: 78  Temp: 98.2 F (36.8 C)  TempSrc: Oral  SpO2: 96%   Constitutional: appears comfortable  Cardiovascular: Regular rate, regular rhythm. No murmurs, rubs, or gallops. Normal radial and PT pulses bilaterally. Minimal LE edema bilaterally.  Pulmonary: Normal respiratory effort. No wheezes, rales, or rhonchi.   Abdominal: Soft. Non-distended. No tenderness. Normal bowel sounds.  Musculoskeletal: Normal range of motion. Mild tenderness to palpation of the right lower back in the paraspinal region.  Neurological: Alert and oriented to person, place, and time. Non-focal. Skin: warm and dry.    Assessment & Plan:   See Encounters Tab for problem based charting.  Patient seen with Dr. Evette Doffing

## 2022-04-01 NOTE — Telephone Encounter (Signed)
Evaluation and management procedures were performed by the Clinical Pharmacy Practitioner under my supervision and collaboration. I have reviewed the Practitioner's note and chart, and I agree with the management and plan as documented above.  Patient will be seen in clinic today.

## 2022-04-01 NOTE — Telephone Encounter (Signed)
I was sent a text at 9:09 PM on Monday 9-OCT-23 by the patient's daughter, Monica--stating:  [place] [facility] INR for today is 6.5. The Doctor (found to be [place]) advised [name] to OMIT/HOLD Monday evenings dose and Tuesdays dose. I advised Monica to always apprise me (as she normally does) of the INR so the Resident Physician(s) would not be pulled from their inpatient responsibilities.   During this text exchange, [name] also endorsed that her mother had been having "her period" for the past two weeks and "needs a referral." [name] states she called her mother's GYN provider ([facility]) but they indicated she could not be seen until December because it had been "years since she was last seen by the group."   In today's text exchange, the daughter [name] states that her mom is still experiencing vaginal bleeding.Given the continued bleeding and after discussing with the Triage Nurse Michail Sermon, we determined it best that she be seen today vs. Her scheduled [facility] appointment on 19-OCT-23.  She (Monica--daughter) and the patient Badour) who I just called were advised to hold today's dose of warfarin (current INR 4.2) given that there is continued bleeding.   Patient agrees to come to the [facility] today.

## 2022-04-01 NOTE — Progress Notes (Signed)
I called the patient and updated her on these results. Discussed the importance of continuing with the warfarin regimen we had discussed at our visit today.

## 2022-04-01 NOTE — Patient Instructions (Signed)
Patient instructed to take medications as defined in the Anti-coagulation Track section of this encounter.  Patient instructed to OMIT today's dose. OMIT tomorrow's dose as well. Patient instructed to take 1/2 of your '5mg'$  strength, peach-colored warfarin tablet, taking 1/2 tablet daily, commencing on Friday 13-OCT-23. Patient verbalized understanding of these instructions.

## 2022-04-01 NOTE — Progress Notes (Signed)
Anticoagulation Management Kim Anthony is a 71 y.o. female who reports to the clinic for monitoring of warfarin treatment.    Indication: PE , history of; long term current use of oral anticoagulant warfarin with INR range 2.5 - 3.5. Duration: indefinite Supervising physician: Lalla Brothers  Anticoagulation Clinic Visit History: Patient does report signs/symptoms of bleeding but not  thromboembolism    Other recent changes: No diet, medications, lifestyle changes--EXCEPT ADDITION OF AMIODARONE approximately 2 weeks ago. Anticoagulation Episode Summary     Current INR goal:  2.5-3.5  TTR:  28.5 % (6.1 y)  Next INR check:  04/06/2022  INR from last check:  4.5 (04/01/2022)  Weekly max warfarin dose:    Target end date:    INR check location:  Anticoagulation Clinic  Preferred lab:    Send INR reminders to:     Indications   Pulmonary embolism (HCC) [I26.99]        Comments:           Allergies  Allergen Reactions   Lisinopril Swelling   Acetaminophen-Codeine Itching   Propoxyphene Itching    Darvocet   Tape Rash    Plastic   Tramadol Itching, Nausea And Vomiting and Rash    Current Outpatient Medications:    albuterol (VENTOLIN HFA) 108 (90 Base) MCG/ACT inhaler, Inhale 2 puffs into the lungs every 6 (six) hours as needed for wheezing or shortness of breath., Disp: 8 g, Rfl: 2   amiodarone (PACERONE) 200 MG tablet, Take 1 tablet (200 mg total) by mouth daily., Disp: 90 tablet, Rfl: 2   diphenhydrAMINE (BENADRYL) 25 MG tablet, Take 75 mg by mouth daily as needed for allergies., Disp: , Rfl:    DULoxetine (CYMBALTA) 20 MG capsule, Take 1 capsule (20 mg total) by mouth at bedtime. X 1 week, then 40 mg nightly x 1 week, then 60 mg nightly- for nerve pain, Disp: 90 capsule, Rfl: 3   famotidine (PEPCID) 20 MG tablet, Take 1 tablet (20 mg total) by mouth 2 (two) times daily., Disp: 60 tablet, Rfl: 0   FARXIGA 10 MG TABS tablet, Take 1 tablet by mouth once daily, Disp: 90  tablet, Rfl: 0   fluticasone (FLONASE) 50 MCG/ACT nasal spray, Place 1 spray into both nostrils daily as needed for allergies., Disp: 9.9 mL, Rfl: 2   gabapentin (NEURONTIN) 600 MG tablet, Take 1.5 tablets (900 mg total) by mouth 3 (three) times daily. Take 1 and 1/2 tablets by mouth three times a day for nerve pain, Disp: 90 tablet, Rfl: 2   HYDROcodone-acetaminophen (NORCO) 5-325 MG tablet, Take 1 tablet by mouth every 4 (four) hours as needed for moderate pain., Disp: 150 tablet, Rfl: 0   metoprolol succinate (TOPROL-XL) 25 MG 24 hr tablet, Take 1 tablet (25 mg total) by mouth daily., Disp: 90 tablet, Rfl: 2   montelukast (SINGULAIR) 10 MG tablet, Take 1 tablet (10 mg total) by mouth at bedtime. (Patient taking differently: Take 10 mg by mouth at bedtime. As needed), Disp: 90 tablet, Rfl: 2   potassium chloride SA (KLOR-CON M) 20 MEQ tablet, Take 1 tablet (20 mEq total) by mouth daily., Disp: 90 tablet, Rfl: 2   Simethicone (GAS-X PO), Take 1 tablet by mouth daily., Disp: , Rfl:    simvastatin (ZOCOR) 20 MG tablet, Take 1 tablet (20 mg total) by mouth daily., Disp: 90 tablet, Rfl: 2   Spacer/Aero-Holding Chambers (BREATHERITE COLL SPACER ADULT) MISC, 1 applicator by Does not apply route daily., Disp: 1  each, Rfl: 0   spironolactone (ALDACTONE) 25 MG tablet, Take 1 tablet by mouth once daily, Disp: 90 tablet, Rfl: 3   Tiotropium Bromide-Olodaterol (STIOLTO RESPIMAT) 2.5-2.5 MCG/ACT AERS, Inhale 2 puffs by mouth daily, Disp: 4 g, Rfl: 0   warfarin (COUMADIN) 2.5 MG tablet, Take 1 tablet (2.5 mg total) by mouth daily., Disp: 20 tablet, Rfl: 1 Past Medical History:  Diagnosis Date   Arthritis    Asthma    Breast cancer ([place]) 2001   Left Breast Cancer   CHF (congestive heart failure) (HCC)    COPD (chronic obstructive pulmonary disease) (HCC)    Coronary artery disease    GERD (gastroesophageal reflux disease)    History of mitral valve replacement with mechanical valve    HLD (hyperlipidemia)     Hypertension    Pulmonary embolism ([place]) 2021   Social History   Socioeconomic History   Marital status: Single    Spouse name: Not on file   Number of children: Not on file   Years of education: Not on file   Highest education level: Not on file  Occupational History   Not on file  Tobacco Use   Smoking status: Every Day    Packs/day: 0.10    Years: 30.00    Total pack years: 3.00    Types: Cigarettes   Smokeless tobacco: Never   Tobacco comments:    5 cigs per day  Vaping Use   Vaping Use: Never used  Substance and Sexual Activity   Alcohol use: Yes    Alcohol/week: 3.0 standard drinks of alcohol    Types: 3 Standard drinks or equivalent per week    Comment: beer- ocasional   Drug use: No   Sexual activity: Not Currently    Birth control/protection: None  Other Topics Concern   Not on file  Social History Narrative   ** Merged History Encounter **       Social Determinants of Health   Financial Resource Strain: Medium Risk (02/27/2021)   Overall Financial Resource Strain (CARDIA)    Difficulty of Paying Living Expenses: Somewhat hard  Food Insecurity: No Food Insecurity (02/24/2022)   Hunger Vital Sign    Worried About Running Out of Food in the Last Year: Never true    Ran Out of Food in the Last Year: Never true  Transportation Needs: No Transportation Needs (03/06/2022)   PRAPARE - Hydrologist (Medical): No    Lack of Transportation (Non-Medical): No  Physical Activity: Not on file  Stress: Not on file  Social Connections: Socially Isolated (11/28/2019)   Social Connection and Isolation Panel [NHANES]    Frequency of Communication with Friends and Family: More than three times a week    Frequency of Social Gatherings with Friends and Family: More than three times a week    Attends Religious Services: Never    Marine scientist or Organizations: No    Attends Music therapist: Not on file    Marital Status:  Never married   Family History  Problem Relation Age of Onset   Hypertension Mother    Cancer Father    Hypertension Father    Breast cancer Maternal Aunt 50   Heart attack Other     ASSESSMENT Recent Results: The most recent result is correlated with having dose omitted for 3 consecutive days. Lab Results  Component Value Date   INR 4.5 (A) 04/01/2022   INR 2.6 (H) 03/09/2022  INR 4.2 (HH) 03/08/2022    Anticoagulation Dosing: Description   Recommence on Friday 13-OCT-23 with 1/2 of your '5mg'$  strength, peach-colored warfarin tablet, taking 1/2 tablet daily.      INR today: Supratherapeutic  PLAN Weekly dose was omitted for 3 days and will be omitted tomorrow and resumed on Friday with 1/2 of her '5mg'$  strength warfarin tablet, by mouth, once-daily. INR on Monday.  Patient Instructions  Patient instructed to take medications as defined in the Anti-coagulation Track section of this encounter.  Patient instructed to OMIT today's dose. OMIT tomorrow's dose as well. Patient instructed to take 1/2 of your '5mg'$  strength, peach-colored warfarin tablet, taking 1/2 tablet daily, commencing on Friday 13-OCT-23. Patient verbalized understanding of these instructions.   Patient advised to contact clinic or seek medical attention if signs/symptoms of bleeding or thromboembolism occur.  Patient verbalized understanding by repeating back information and was advised to contact me if further medication-related questions arise. Patient was also provided an information handout.  Follow-up Return in 5 days (on 04/06/2022) for Follow up INR.  Pennie Banter, PharmD, CPP  15 minutes spent face-to-face with the patient during the encounter. 50% of time spent on education, including signs/sx bleeding and clotting, as well as food and drug interactions with warfarin. 50% of time was spent on fingerprick POC INR sample collection,processing, results determination, and documentation in  [email or web address]

## 2022-04-02 ENCOUNTER — Encounter (HOSPITAL_COMMUNITY): Payer: Self-pay

## 2022-04-02 ENCOUNTER — Ambulatory Visit (HOSPITAL_COMMUNITY): Payer: Medicare Other | Admitting: Nurse Practitioner

## 2022-04-02 NOTE — Progress Notes (Signed)
Internal Medicine Clinic Attending  I saw and evaluated the patient.  I personally confirmed the key portions of the history and exam documented by Dr. Alton Revere and I reviewed pertinent patient test results.  The assessment, diagnosis, and plan were formulated together and I agree with the documentation in the resident's note.   Person living with mechanical mitral valve on anticoagulation for primary stroke prophylaxis, but now with elevated INRs after starting amiodarone. She is having minor vaginal bleeding, has a remote history of uterine fibroids which are likely the culprit. No anemia and maybe early iron deficiency. Will hold coumadin for two additional days and dose reduce to accommodate the new metabolism from the amiodarone. Given only minor bleeding, no need for vitamin k reversal at this time. Will recheck INR on Monday. TV ultrasound to evaluate post-menopausal vaginal bleeding, which will hopefully resolve as we get the INR back in range.

## 2022-04-02 NOTE — Addendum Note (Signed)
Addended by: Lalla Brothers T on: 04/02/2022 09:32 AM   Modules accepted: Level of Service

## 2022-04-02 NOTE — Telephone Encounter (Signed)
Error

## 2022-04-06 ENCOUNTER — Ambulatory Visit: Payer: Medicare Other

## 2022-04-06 ENCOUNTER — Telehealth: Payer: Self-pay | Admitting: *Deleted

## 2022-04-06 NOTE — Telephone Encounter (Signed)
Patient called in wanting to cancel today's appt and was transferred to Triage. Patient now with abdominal pain and diarrhea. She is strongly advised to keep appt since she now has additional symptoms to vaginal bleeding. States she will try.

## 2022-04-06 NOTE — Progress Notes (Deleted)
CC: f/u of vaginal bleeding  HPI:  [name] is a 71 y.o. female with past medical history of HTN, HLD, HFrEF, PE, TAA, a-fib (on warfarin), and COPD that presents for f/u of vaginal bleeding.    Allergies as of 04/06/2022       Reactions   Lisinopril Swelling   Acetaminophen-codeine Itching   Propoxyphene Itching   Darvocet   Tape Rash   Plastic   Tramadol Itching, Nausea And Vomiting, Rash        Medication List        Accurate as of April 06, 2022  7:21 AM. If you have any questions, ask your nurse or doctor.          albuterol 108 (90 Base) MCG/ACT inhaler Commonly known as: VENTOLIN HFA Inhale 2 puffs into the lungs every 6 (six) hours as needed for wheezing or shortness of breath.   amiodarone 200 MG tablet Commonly known as: PACERONE Take 1 tablet (200 mg total) by mouth daily.   BreatheRite Coll Spacer Adult Misc 1 applicator by Does not apply route daily.   diphenhydrAMINE 25 MG tablet Commonly known as: BENADRYL Take 75 mg by mouth daily as needed for allergies.   DULoxetine 20 MG capsule Commonly known as: Cymbalta Take 1 capsule (20 mg total) by mouth at bedtime. X 1 week, then 40 mg nightly x 1 week, then 60 mg nightly- for nerve pain   famotidine 20 MG tablet Commonly known as: PEPCID Take 1 tablet (20 mg total) by mouth 2 (two) times daily.   Farxiga 10 MG Tabs tablet Generic drug: dapagliflozin propanediol Take 1 tablet by mouth once daily   fluticasone 50 MCG/ACT nasal spray Commonly known as: Flonase Place 1 spray into both nostrils daily as needed for allergies.   gabapentin 600 MG tablet Commonly known as: NEURONTIN Take 1.5 tablets (900 mg total) by mouth 3 (three) times daily. Take 1 and 1/2 tablets by mouth three times a day for nerve pain   GAS-X PO Take 1 tablet by mouth daily.   HYDROcodone-acetaminophen 5-325 MG tablet Commonly known as: Norco Take 1 tablet by mouth every 4 (four) hours as needed for  moderate pain.   metoprolol succinate 25 MG 24 hr tablet Commonly known as: TOPROL-XL Take 1 tablet (25 mg total) by mouth daily.   montelukast 10 MG tablet Commonly known as: Singulair Take 1 tablet (10 mg total) by mouth at bedtime. What changed: additional instructions   potassium chloride SA 20 MEQ tablet Commonly known as: KLOR-CON M Take 1 tablet (20 mEq total) by mouth daily.   simvastatin 20 MG tablet Commonly known as: ZOCOR Take 1 tablet (20 mg total) by mouth daily.   spironolactone 25 MG tablet Commonly known as: ALDACTONE Take 1 tablet by mouth once daily   Stiolto Respimat 2.5-2.5 MCG/ACT Aers Generic drug: Tiotropium Bromide-Olodaterol Inhale 2 puffs by mouth daily   warfarin 2.5 MG tablet Commonly known as: Coumadin Take as directed by the anticoagulation clinic. If you are unsure how to take this medication, talk to your nurse or doctor. Original instructions: Take 1 tablet (2.5 mg total) by mouth daily.         Past Medical History:  Diagnosis Date   Arthritis    Asthma    Breast cancer ([place]) 2001   Left Breast Cancer   CHF (congestive heart failure) (HCC)    COPD (chronic obstructive pulmonary disease) (HCC)    Coronary artery disease  GERD (gastroesophageal reflux disease)    History of mitral valve replacement with mechanical valve    HLD (hyperlipidemia)    Hypertension    Pulmonary embolism ([place]) 2021   Review of Systems:  per HPI.   Physical Exam: *** There were no vitals filed for this visit.  *** Constitutional: Well-developed, well-nourished, appears comfortable  HENT: Normocephalic and atraumatic.  Eyes: EOM are normal. PERRL.  Neck: Normal range of motion.  Cardiovascular: Regular rate, regular rhythm. No murmurs, rubs, or gallops. Normal radial and PT pulses bilaterally. No LE edema.  Pulmonary: Normal respiratory effort. No wheezes, rales, or rhonchi.   Abdominal: Soft. Non-distended. No tenderness. Normal bowel sounds.   Musculoskeletal: Normal range of motion.     Neurological: Alert and oriented to person, place, and time. Non-focal. Skin: warm and dry.    Assessment & Plan:   ?: ***  Vaginal bleeding - Patient was seen in clinic 1 week ago for 2 weeks of vaginal bleeding. Patient also reported 1 day of pelvic pain and right-sided lower back pain that was new at that time. Patient denied bleeding elsewhere, recent falls or trauma, HA, SOB, syncope, dysuria, frequency. INR was 4.2 at this visit. Patient was taking warfarin 5 MG for her a-fib and had started amiodarone the month prior which can decrease the metabolism of warfarin. At our previous visit, she was instructed to hold warfarin for 2 days and then start back at 2.5 mg on 10/13. Patient was compliant with these instructions. TVUS, CBC, and iron studies were ordered. TVUS has not been completed yet. Dr. Elie Confer follows her INR. Patient underwent menopause around age 55 and had not had any vaginal bleeding until 3 weeks ago. Patient denies having prior hysterectomy. Patients states that she remembers having fibroids in the past but denies having previous treatment for them.    Plan: - F/u on results of TVUS - Continue warfarin 2.5 mg  2. HTN - Current medications include metoprolol succinate 25 MG. Patient states that she is *** compliant with this medication. Patient states that she does *** check her BP regularly at home. Patient denies HA, lightheadedness, dizziness, CP, or SOB. Initial BP today is ***. Repeat BP is ***.    Plan: - Continue metoprolol succinate 25 MG  3. - Current medications include  Plan: -  4. Health Screening: - Influenza vaccine (),  - Medication refill?  Plan: -   See Encounters Tab for problem based charting.  Patient seen with Dr. Barbaraann Boys

## 2022-04-07 ENCOUNTER — Ambulatory Visit: Payer: Medicare Other

## 2022-04-08 ENCOUNTER — Telehealth: Payer: Self-pay | Admitting: Pharmacist

## 2022-04-08 NOTE — Telephone Encounter (Signed)
Called daugther to learn PST FS POC INR results (was supposed to do last night, was not done) INR today = 1.9 after 3 days of holding warfarin. Patient is on amiodarone which typically causes a marked hypoprothrombinemic response due to enzyme inhibition. Typically a 30 - 50 percent reduction in warfarin total weekly dose is required. Patient's daughter [name] when queried indicates that no bleeding is currently happening (vaginal) or any other sites. States abdominal pain and diarrhea cited in note on 16-OCT-23 by Triage Nurse has subsided. Will resume warfarin cautiously with 1/2 of her '5mg'$  peach colored warfarin tablet by mouth for the next five (5) days and INR must be performed on Monday-OCT-23 and called or texted to me.

## 2022-04-09 ENCOUNTER — Encounter: Payer: Medicare Other | Admitting: Family Medicine

## 2022-04-09 ENCOUNTER — Encounter: Payer: Medicare Other | Admitting: Student

## 2022-04-10 ENCOUNTER — Ambulatory Visit (HOSPITAL_COMMUNITY)
Admission: RE | Admit: 2022-04-10 | Discharge: 2022-04-10 | Disposition: A | Discharge status: Home / Self Care | Payer: Medicare Other | Source: Ambulatory Visit | Attending: Family Medicine | Admitting: Family Medicine

## 2022-04-10 ENCOUNTER — Encounter (HOSPITAL_COMMUNITY): Payer: Self-pay

## 2022-04-10 VITALS — BP 126/82 | HR 82 | Wt 160.8 lb

## 2022-04-10 DIAGNOSIS — Z86718 Personal history of other venous thrombosis and embolism: Secondary | ICD-10-CM | POA: Diagnosis not present

## 2022-04-10 DIAGNOSIS — I10 Essential (primary) hypertension: Secondary | ICD-10-CM

## 2022-04-10 DIAGNOSIS — J4489 Other specified chronic obstructive pulmonary disease: Secondary | ICD-10-CM | POA: Insufficient documentation

## 2022-04-10 DIAGNOSIS — I110 Hypertensive heart disease with heart failure: Secondary | ICD-10-CM | POA: Diagnosis not present

## 2022-04-10 DIAGNOSIS — Z7901 Long term (current) use of anticoagulants: Secondary | ICD-10-CM | POA: Diagnosis not present

## 2022-04-10 DIAGNOSIS — Z79899 Other long term (current) drug therapy: Secondary | ICD-10-CM | POA: Insufficient documentation

## 2022-04-10 DIAGNOSIS — Z7984 Long term (current) use of oral hypoglycemic drugs: Secondary | ICD-10-CM | POA: Diagnosis not present

## 2022-04-10 DIAGNOSIS — Z716 Tobacco abuse counseling: Secondary | ICD-10-CM | POA: Insufficient documentation

## 2022-04-10 DIAGNOSIS — I5042 Chronic combined systolic (congestive) and diastolic (congestive) heart failure: Secondary | ICD-10-CM | POA: Insufficient documentation

## 2022-04-10 DIAGNOSIS — J449 Chronic obstructive pulmonary disease, unspecified: Secondary | ICD-10-CM

## 2022-04-10 DIAGNOSIS — Z952 Presence of prosthetic heart valve: Secondary | ICD-10-CM | POA: Insufficient documentation

## 2022-04-10 DIAGNOSIS — I4892 Unspecified atrial flutter: Secondary | ICD-10-CM

## 2022-04-10 DIAGNOSIS — Z720 Tobacco use: Secondary | ICD-10-CM | POA: Diagnosis not present

## 2022-04-10 DIAGNOSIS — I2510 Atherosclerotic heart disease of native coronary artery without angina pectoris: Secondary | ICD-10-CM | POA: Insufficient documentation

## 2022-04-10 DIAGNOSIS — F17210 Nicotine dependence, cigarettes, uncomplicated: Secondary | ICD-10-CM | POA: Diagnosis not present

## 2022-04-10 DIAGNOSIS — I081 Rheumatic disorders of both mitral and tricuspid valves: Secondary | ICD-10-CM | POA: Insufficient documentation

## 2022-04-10 DIAGNOSIS — I071 Rheumatic tricuspid insufficiency: Secondary | ICD-10-CM | POA: Diagnosis not present

## 2022-04-10 DIAGNOSIS — J385 Laryngeal spasm: Secondary | ICD-10-CM | POA: Diagnosis not present

## 2022-04-10 DIAGNOSIS — Z86711 Personal history of pulmonary embolism: Secondary | ICD-10-CM | POA: Insufficient documentation

## 2022-04-10 LAB — BASIC METABOLIC PANEL WITH GFR
Anion gap: 14 (ref 5–15)
BUN: 14 mg/dL (ref 8–23)
CO2: 20 mmol/L — ABNORMAL LOW (ref 22–32)
Calcium: 9.5 mg/dL (ref 8.9–10.3)
Chloride: 100 mmol/L (ref 98–111)
Creatinine, Ser: 1.16 mg/dL — ABNORMAL HIGH (ref 0.44–1.00)
GFR, Estimated: 50 mL/min — ABNORMAL LOW
Glucose, Bld: 95 mg/dL (ref 70–99)
Potassium: 4 mmol/L (ref 3.5–5.1)
Sodium: 134 mmol/L — ABNORMAL LOW (ref 135–145)

## 2022-04-10 LAB — BRAIN NATRIURETIC PEPTIDE: B Natriuretic Peptide: 275.5 pg/mL — ABNORMAL HIGH (ref 0.0–100.0)

## 2022-04-10 MED ORDER — LOSARTAN POTASSIUM 25 MG PO TABS: 12.5000 mg | ORAL_TABLET | Freq: Every day | ORAL | 2 refills | Status: DC

## 2022-04-10 MED ORDER — POTASSIUM CHLORIDE CRYS ER 20 MEQ PO TBCR: 40.0000 meq | EXTENDED_RELEASE_TABLET | Freq: Every day | ORAL | 6 refills | Status: DC

## 2022-04-10 NOTE — Progress Notes (Signed)
Advanced Heart Failure Clinic Note   PCP: Iona Coach, MD HF Cardiologist: Dr. Haroldine Laws  HPI: Kim Anthony is a 71 y.o. female with a history of mechanical MVR in 2001 in [place], [place], hx breast cancer s/p left mastectomy and radiation/chemo, tobacco use, COPD, prior PE/DVT, HTN, undergoing workup for possible inflammatory arthritis with Rheumatology, prior CABG (she is unaware of prior CAD), and systolic CHF.   EF (2017) 45-50% (no EF on file prior to this). Had improved to 55-60% on Echo in July 2020.  She was admitted 01/05/21 with A/C CHF exacerbation and likely COPD exacerbation.Echo with EF of 35-40%, previously 55-60% on echo July 2020. No significant MR. Mildly enlarged RV. Etiology for decline in EF was not certain so she was scheduled for [facility] to rule out obstructive CAD. R/LHC showed 1v CAD with occluded mRCA and stable hemodynamics.  Echo 08/01/21 EF 50-55% RV moderately down. MVR ok Mean gradient 2.7  Admitted 8/23 w/ a/c CHF and AFL. Echo showed EF 35-40%, LV global HK, severe RV dysfunction, mean mitral valve gradient 8 mmHg, severe TR. Diuresed with IV lasix and started on oral amiodarone load.. Underwent TEE showing LVEF 35-40%, global HK, hypokinetic RV poorly visualized, no thrombus in LAA. Unfortunately patient had increased secretions and laryngospams and test aborted. Later underwent DCCV from AFL to NSR. She was discharged home on amiodarone.  Admitted 9/23 with facial swelling. Resolved and referred to allergy. QTc long on ECG (QTc 410 msec on ECG 03/09/22), and amiodarone then stopped.  Follow up 9/23, NYHA II and volume up. Torsemide 20 mg daily restarted. Amiodarone restarted.  Today she returns for HF follow up. Overall feeling much better. Now able to walk up steps without SOB. Denies palpitations, CP, dizziness, edema, or PND/Orthopnea. Having vaginal bleeding, INR has been supratherapeutic with recent addition of amiodarone. She is followed by [facility].  Appetite ok. No fever or chills. Weight at home 160 pounds. Taking all medications. Smokes 3-4 cigs/day, drinks 1 12 oz/beer day.   Cardiac Studies:  - Echo (8/23): EF 35-40%, LV global HK, severe RV dysfunction, mean mitral valve gradient 8 mmHg, severe TR.   - Echo (2/23): EF 50-55%, RV moderately down, MVR ok  - Echo (7/22): EF 35-40% (Dr. Jerilee Field felt EF 45%)  - R/LHC (7/22): with 1v CAD with occluded mRCA, RA 9, PCW 14, CI 2.9.  - Echo (2020): EF 55-60%    Past Medical History:  Diagnosis Date   Arthritis    Asthma    Breast cancer ([place]) 2001   Left Breast Cancer   CHF (congestive heart failure) (HCC)    COPD (chronic obstructive pulmonary disease) ([place])    Coronary artery disease    GERD (gastroesophageal reflux disease)    History of mitral valve replacement with mechanical valve    HLD (hyperlipidemia)    Hypertension    Pulmonary embolism ([place]) 2021   Current Outpatient Medications  Medication Sig Dispense Refill   albuterol (VENTOLIN HFA) 108 (90 Base) MCG/ACT inhaler Inhale 2 puffs into the lungs every 6 (six) hours as needed for wheezing or shortness of breath. 8 g 2   amiodarone (PACERONE) 200 MG tablet Take 1 tablet (200 mg total) by mouth daily. 90 tablet 2   diphenhydrAMINE (BENADRYL) 25 MG tablet Take 75 mg by mouth daily as needed for allergies.     DULoxetine (CYMBALTA) 20 MG capsule Take 1 capsule (20 mg total) by mouth at bedtime. X 1 week, then 40 mg nightly  x 1 week, then 60 mg nightly- for nerve pain 90 capsule 3   famotidine (PEPCID) 20 MG tablet Take 1 tablet (20 mg total) by mouth 2 (two) times daily. 60 tablet 0   FARXIGA 10 MG TABS tablet Take 1 tablet by mouth once daily 90 tablet 0   fluticasone (FLONASE) 50 MCG/ACT nasal spray Place 1 spray into both nostrils daily as needed for allergies. 9.9 mL 2   gabapentin (NEURONTIN) 600 MG tablet Take 1.5 tablets (900 mg total) by mouth 3 (three) times daily. Take 1 and 1/2 tablets by mouth three times  a day for nerve pain 90 tablet 2   HYDROcodone-acetaminophen (NORCO) 5-325 MG tablet Take 1 tablet by mouth every 4 (four) hours as needed for moderate pain. 150 tablet 0   metoprolol succinate (TOPROL-XL) 25 MG 24 hr tablet Take 1 tablet (25 mg total) by mouth daily. 90 tablet 2   montelukast (SINGULAIR) 10 MG tablet Take 1 tablet (10 mg total) by mouth at bedtime. (Patient taking differently: Take 10 mg by mouth at bedtime. As needed) 90 tablet 2   potassium chloride SA (KLOR-CON M) 20 MEQ tablet Take 1 tablet (20 mEq total) by mouth daily. 90 tablet 2   Simethicone (GAS-X PO) Take 1 tablet by mouth daily.     simvastatin (ZOCOR) 20 MG tablet Take 1 tablet (20 mg total) by mouth daily. 90 tablet 2   Spacer/Aero-Holding Chambers (BREATHERITE COLL SPACER ADULT) MISC 1 applicator by Does not apply route daily. 1 each 0   spironolactone (ALDACTONE) 25 MG tablet Take 1 tablet by mouth once daily 90 tablet 3   Tiotropium Bromide-Olodaterol (STIOLTO RESPIMAT) 2.5-2.5 MCG/ACT AERS Inhale 2 puffs by mouth daily 4 g 0   warfarin (COUMADIN) 2.5 MG tablet Take 1 tablet (2.5 mg total) by mouth daily. (Patient taking differently: Take 0.5 mg by mouth daily.) 20 tablet 1   No current facility-administered medications for this encounter.   Allergies  Allergen Reactions   Lisinopril Swelling   Acetaminophen-Codeine Itching   Propoxyphene Itching    Darvocet   Tape Rash    Plastic   Tramadol Itching, Nausea And Vomiting and Rash   Social History   Socioeconomic History   Marital status: Single    Spouse name: Not on file   Number of children: Not on file   Years of education: Not on file   Highest education level: Not on file  Occupational History   Not on file  Tobacco Use   Smoking status: Every Day    Packs/day: 0.10    Years: 30.00    Total pack years: 3.00    Types: Cigarettes   Smokeless tobacco: Never   Tobacco comments:    5 cigs per day  Vaping Use   Vaping Use: Never used   Substance and Sexual Activity   Alcohol use: Yes    Alcohol/week: 3.0 standard drinks of alcohol    Types: 3 Standard drinks or equivalent per week    Comment: beer- ocasional   Drug use: No   Sexual activity: Not Currently    Birth control/protection: None  Other Topics Concern   Not on file  Social History Narrative   ** Merged History Encounter **       Social Determinants of Health   Financial Resource Strain: Medium Risk (02/27/2021)   Overall Financial Resource Strain (CARDIA)    Difficulty of Paying Living Expenses: Somewhat hard  Food Insecurity: No Food Insecurity (02/24/2022)  Hunger Vital Sign    Worried About Running Out of Food in the Last Year: Never true    Ran Out of Food in the Last Year: Never true  Transportation Needs: No Transportation Needs (03/06/2022)   PRAPARE - Hydrologist (Medical): No    Lack of Transportation (Non-Medical): No  Physical Activity: Not on file  Stress: Not on file  Social Connections: Socially Isolated (11/28/2019)   Social Connection and Isolation Panel [NHANES]    Frequency of Communication with Friends and Family: More than three times a week    Frequency of Social Gatherings with Friends and Family: More than three times a week    Attends Religious Services: Never    Marine scientist or Organizations: No    Attends Music therapist: Not on file    Marital Status: Never married  Human resources officer Violence: Not on file    Family History  Problem Relation Age of Onset   Hypertension Mother    Cancer Father    Hypertension Father    Breast cancer Maternal Aunt 50   Heart attack Other    BP 126/82   Pulse 82   Wt 72.9 kg (160 lb 12.8 oz)   SpO2 97%   BMI 26.76 kg/m   Wt Readings from Last 3 Encounters:  04/10/22 72.9 kg (160 lb 12.8 oz)  03/16/22 79.4 kg (175 lb)  03/13/22 79.2 kg (174 lb 9.6 oz)   PHYSICAL EXAM: General:  NAD. No resp difficulty HEENT: Normal Neck:  Supple. No JVD. Carotids 2+ bilat; no bruits. No lymphadenopathy or thryomegaly appreciated. Cor: PMI nondisplaced. Regular rate & rhythm. No rubs, gallops or murmurs. + mechanical S1 Lungs: Clear Abdomen: Soft, nontender, nondistended. No hepatosplenomegaly. No bruits or masses. Good bowel sounds. Extremities: No cyanosis, clubbing, rash, edema Neuro: Alert & oriented x 3, cranial nerves grossly intact. Moves all 4 extremities w/o difficulty. Affect pleasant.  ASSESSMENT & PLAN: Chronic combined CHF: - Echo (2017): EF 45-50% in setting of PE. Has mild to moderate RV dysfunction. MV prosthesis okay. - Echo (7/22): EF 35-40% this admit (Dr. Jerilee Field felt EF 45%), previously 55-60% in 2020.  - R/LHC (01/09/21): with 1v CAD mRCA chronically occluded and well compensated hemodynamics. - Echo (08/01/21): EF 50-55% RV moderately down. MVR ok Mean gradient 3  - Admitted 8/23 with AFL with RVR and a/c CHF. - Echo (8/23): EF 35-40%, LV global HK, severe RV dysfunction, mean mitral valve gradient 8 mmHg, severe TR. ? Tachy mediated reduction in EF. - NYHA I-II, volume looks good today. - Start losartan 12.5 mg daily (h/o angioedema with ACE-I, so no ARNi). - Continue torsemide 20 mg daily + increase KCL to 40 daily.  - Continue spiro 25 mg daily. - Continue Toprol XL 25 mg daily. - Continue Farxiga 10 mg daily. - Now off hydral/Imdur.  - BMET, BNP today. Repeat BMET in 10-14 days. - Repeat echo in next few weeks now that she is back in NSR to look for EF improvement.  2. AFL - Recent admit with RVR. - s/p TEE and DCCV 9/23 to NSR. - She was instructed to stop amio due to long QTc (QTC 410 msec on recent ECG). Minimize QT pro-longing agents - She does not do well in AFL/AF. - Regular on exam today. - Continue amiodarone 200 mg daily. Not ideal with her COPD. She has been referred to AF clinic to discuss AAD vs ablation options.  -  Continue warfarin. INR followed by Dr. Elie Confer. - She has home  sleep study, has not completed.  3. CAD  - 1 V CAD with occluded mid RCA on cath (7/22). - No s/s angina. - No ASA with warfarin use. - Continue beta blocker + statin.     4. Hx of mechanical mitral valve replacement: - Mechanical MVR in [place] in 2001. - Echo (7/22) mean gradient of 4 mmHg with no significant MR. - Echo 08/01/21 EF 50-55% RV moderately down. MVR ok Mean gradient 3  - Echo 8/23 mitral valve mean gradient 8 mmHg (was in AFL and volume overload) - TEE (9/23) poor images due to laryngospasm/airway issues. - Continue Coumadin.  - Continue SBE prophylaxis. - Repeat echo down the road.   5. Tricuspid valve regurgitation - Mild to moderate by echo 3/23. - Severe on recent echo (8/23) - No murmur on exam today - Repeat echo.   6. Hx DVT/PE - On Coumadin. - INRs followed by Dr. Elie Confer.   7. HTN - Blood pressure stable. - Add losartan as above.  8. COPD - Continue inhalers. - No wheezes on exam.   9. Smoker: - Stressed need for smoking cessation  Doing well!. Follow up in 2 months with Dr. Haroldine Laws +echo, as scheduled.    Bee, FNP 04/10/22

## 2022-04-10 NOTE — Patient Instructions (Addendum)
Thank you for coming in today  Labs were done today, if any labs are abnormal the clinic will call you No news is good news  START Losartan 12.5 mg 1/2 tablet daily  INCREASE Potassium to 40 meq 2 tablets once daily  Your physician has requested that you have an echocardiogram. Echocardiography is a painless test that uses sound waves to create images of your heart. It provides your doctor with information about the size and shape of your heart and how well your heart's chambers and valves are working. This procedure takes approximately one hour. There are no restrictions for this procedure.   Your physician recommends that you return for lab work in:  2 weeks BMET  Your physician recommends that you schedule a follow-up appointment in:  Keep your follow up appointment with Dr. Haroldine Laws    Do the following things EVERYDAY: Weigh yourself in the morning before breakfast. Write it down and keep it in a log. Take your medicines as prescribed Eat low salt foods--Limit salt (sodium) to 2000 mg per day.  Stay as active as you can everyday Limit all fluids for the day to less than 2 liters  At the [facility], you and your health needs are our priority. As part of our continuing mission to provide you with exceptional heart care, we have created designated Provider Care Teams. These Care Teams include your primary Cardiologist (physician) and Advanced Practice Providers (APPs- Physician Assistants and Nurse Practitioners) who all work together to provide you with the care you need, when you need it.   You may see any of the following providers on your designated Care Team at your next follow up: Dr Glori Bickers Dr Loralie Champagne Dr. Roxana Hires, NP Lyda Jester, [place] [facility] [place], [place] Forestine Na, NP Audry Riles, PharmD   Please be sure to bring in all your medications bottles to every appointment.   If you have any questions  or concerns before your next appointment please send [place] a message through [place] or call our office at [phone].    TO LEAVE A MESSAGE FOR THE NURSE SELECT OPTION 2, PLEASE LEAVE A MESSAGE INCLUDING: YOUR NAME DATE OF BIRTH CALL BACK NUMBER REASON FOR CALL**this is important as we prioritize the call backs  YOU WILL RECEIVE A CALL BACK THE SAME DAY AS LONG AS YOU CALL BEFORE 4:00 PM

## 2022-04-11 ENCOUNTER — Ambulatory Visit (HOSPITAL_COMMUNITY): Payer: Medicare Other

## 2022-04-13 ENCOUNTER — Telehealth (HOSPITAL_COMMUNITY): Payer: Self-pay | Admitting: Surgery

## 2022-04-13 NOTE — Telephone Encounter (Signed)
Patient called and instructed to proceed with the ordered home sleep study. Insurance  precert is not needed.

## 2022-04-14 ENCOUNTER — Ambulatory Visit
Admission: RE | Admit: 2022-04-14 | Discharge: 2022-04-14 | Disposition: A | Discharge status: Home / Self Care | Payer: Medicare Other | Source: Ambulatory Visit | Attending: Internal Medicine | Admitting: Internal Medicine

## 2022-04-14 DIAGNOSIS — Z1231 Encounter for screening mammogram for malignant neoplasm of breast: Secondary | ICD-10-CM

## 2022-04-15 ENCOUNTER — Ambulatory Visit (HOSPITAL_COMMUNITY)
Admission: RE | Admit: 2022-04-15 | Discharge: 2022-04-15 | Disposition: A | Discharge status: Home / Self Care | Payer: Medicare Other | Source: Ambulatory Visit | Attending: Student in an Organized Health Care Education/Training Program | Admitting: Student in an Organized Health Care Education/Training Program

## 2022-04-15 DIAGNOSIS — N939 Abnormal uterine and vaginal bleeding, unspecified: Secondary | ICD-10-CM | POA: Insufficient documentation

## 2022-04-15 DIAGNOSIS — R9389 Abnormal findings on diagnostic imaging of other specified body structures: Secondary | ICD-10-CM | POA: Diagnosis not present

## 2022-04-16 ENCOUNTER — Telehealth: Payer: Self-pay | Admitting: *Deleted

## 2022-04-20 NOTE — Telephone Encounter (Signed)
Was provided results of PST FS POC INR by daughter of the patient. INR = 1.7 after resuming warfarin at 2.'5mg'$  (1/2 x '5mg'$ ) PO daily. Will increase to '5mg'$  QD x 2 days, then 2.'5mg'$  PO Daily until next INR on Friday 3-NOV-23.   No missed doses. Daughter states that the vaginal bleeding that had been occurring has subsided. Had an ultrasound performed last week (Wednesday, 25-OCT-23).

## 2022-04-24 ENCOUNTER — Other Ambulatory Visit (HOSPITAL_COMMUNITY): Payer: Medicare Other

## 2022-04-28 DIAGNOSIS — H524 Presbyopia: Secondary | ICD-10-CM | POA: Diagnosis not present

## 2022-04-29 DIAGNOSIS — M1712 Unilateral primary osteoarthritis, left knee: Secondary | ICD-10-CM | POA: Diagnosis not present

## 2022-04-29 DIAGNOSIS — M25561 Pain in right knee: Secondary | ICD-10-CM | POA: Diagnosis not present

## 2022-04-30 ENCOUNTER — Ambulatory Visit: Payer: Self-pay

## 2022-05-01 NOTE — Patient Instructions (Signed)
Visit Information  Thank you for taking time to visit with me today. Please don't hesitate to contact me if I can be of assistance to you.   Following are the goals we discussed today:   Goals Addressed             This Visit's Progress    Monitor and Manage my CHF       Care Coordination Interventions: Provided education on low sodium diet Reviewed Heart Failure Action Plan in depth and provided written copy Discussed importance of daily weight and advised patient to weigh and record daily Reviewed role of diuretics in prevention of fluid overload and management of heart failure; Active listening / Reflection utilized  Problem [place] strategies reviewed During our conversation, the patient informed me that she is doing well. She mentioned that she experienced a fluid retention episode of 20 lbs that required hospitalization, which caused her weight to drop from 181 to 160 lbs. Thankfully, she is now able to breathe and walk without any shortness of breath. We also went over her HF's action plan, and she's been adhering to it by taking her medications and monitoring her food intake.          Our next appointment is by telephone on 12/12 at 2 pm  Please call the care guide team at [phone] if you need to cancel or reschedule your appointment.   If you are experiencing a Mental Health or [place] or need someone to talk to, please call 1-800-273-TALK (toll free, 24 hour hotline)  Patient verbalizes understanding of instructions and care plan provided today and agrees to view in [place]. Active MyChart status and patient understanding of how to access instructions and care plan via MyChart confirmed with patient.     Lazaro Arms RN, BSN, [facility]   Phone: [phone]

## 2022-05-01 NOTE — Patient Outreach (Signed)
  Care Coordination   Follow Up Visit Note   04/30/22 Name: Kim Anthony [name]: [id number] DOB: 03-28-1951  Kim Anthony is a 71 y.o. year old female who sees Iona Coach, MD for primary care. I spoke with  Shaune Pascal by phone today.  What matters to the patients health and wellness today?  I am doing well    Goals Addressed             This Visit's Progress    Monitor and Manage my CHF       Care Coordination Interventions: Provided education on low sodium diet Reviewed Heart Failure Action Plan in depth and provided written copy Discussed importance of daily weight and advised patient to weigh and record daily Reviewed role of diuretics in prevention of fluid overload and management of heart failure; Active listening / Reflection utilized  Problem [place] strategies reviewed During our conversation, the patient informed me that she is doing well. She mentioned that she experienced a fluid retention episode of 20 lbs that required hospitalization, which caused her weight to drop from 181 to 160 lbs. Thankfully, she is now able to breathe and walk without any shortness of breath. We also went over her HF's action plan, and she's been adhering to it by taking her medications and monitoring her food intake.          SDOH assessments and interventions completed:  No     Care Coordination Interventions Activated:  Yes  Care Coordination Interventions:  Yes, provided   Follow up plan: Follow up call scheduled for 12/12 2 pm    Encounter Outcome:  Pt. Visit Completed   Lazaro Arms RN, BSN, [place]   Phone: [phone]

## 2022-05-01 NOTE — Patient Outreach (Deleted)
{  CARE COORDINATION NOTES:27886} 

## 2022-05-04 ENCOUNTER — Ambulatory Visit (HOSPITAL_COMMUNITY): Admission: RE | Admit: 2022-05-04 | Payer: Medicare Other | Source: Ambulatory Visit

## 2022-05-04 NOTE — Progress Notes (Incomplete)
Primary Care Physician: Iona Coach, MD Primary Cardiologist: Dr Haroldine Laws  Primary Electrophysiologist: none Referring Physician: Allena Katz NP   Kim Anthony is a 71 y.o. female with a history of mech MVR 2001, breast cancer s/p mastectomy/radiation/chemo, COPD, tobacco use, prior DVT/PE, CAD, systolic CHF, HTN, atrial flutter, atrial fibrillation who presents for consultation in the [facility]. She was admitted 01/2022 w/ a/c CHF and atrial flutter. Echo showed EF 35-40%, LV global HK, severe RV dysfunction, mean mitral valve gradient 8 mmHg, severe TR. Diuresed with IV lasix and started on oral amiodarone load. Underwent TEE showing LVEF 35-40%, global HK, hypokinetic RV poorly visualized, no thrombus in LAA. Unfortunately patient had increased secretions and laryngospams and test aborted. Later underwent DCCV from AFL to NSR. She was discharged home on amiodarone. Patient is on warfarin for a CHADS2VASC score of 5. ***  Today, she denies symptoms of ***palpitations, chest pain, shortness of breath, orthopnea, PND, lower extremity edema, dizziness, presyncope, syncope, snoring, daytime somnolence, bleeding, or neurologic sequela. The patient is tolerating medications without difficulties and is otherwise without complaint today.    Atrial Fibrillation Risk Factors:  she {Action; does/does not:19097} have symptoms or diagnosis of sleep apnea. she {ACTION; IS/IS GEX:[id number]} compliant with CPAP therapy. she {Action; does/does not:19097} have a history of rheumatic fever. she {Action; does/does not:19097} have a history of alcohol use. The patient {Action; does/does not:19097} have a history of early familial atrial fibrillation or other arrhythmias.  she has a BMI of There is no height or weight on file to calculate BMI.. There were no vitals filed for this visit.  Family History  Problem Relation Age of Onset   Hypertension Mother    Cancer Father     Hypertension Father    Breast cancer Maternal Aunt 49   Heart attack Other      Atrial Fibrillation Management history:  Previous antiarrhythmic drugs: amiodarone  Previous cardioversions: 02/26/22 Previous ablations: none CHADS2VASC score: 5 Anticoagulation history: warfarin    Past Medical History:  Diagnosis Date   Arthritis    Asthma    Breast cancer ([place]) 2001   Left Breast Cancer   CHF (congestive heart failure) (HCC)    COPD (chronic obstructive pulmonary disease) ([place])    Coronary artery disease    GERD (gastroesophageal reflux disease)    History of mitral valve replacement with mechanical valve    HLD (hyperlipidemia)    Hypertension    Pulmonary embolism ([place]) 2021   Past Surgical History:  Procedure Laterality Date   APPLICATION OF A-CELL OF CHEST/ABDOMEN Left 01/27/2019   Procedure: APPLICATION OF A-CELL OF CHEST;  Surgeon: Wallace Going, DO;  Location: [place];  Service: Plastics;  Laterality: Left;   APPLICATION OF WOUND VAC N/A 01/27/2019   Procedure: APPLICATION OF WOUND VAC;  Surgeon: Wallace Going, DO;  Location: [place];  Service: Plastics;  Laterality: N/A;   BREAST SURGERY Left 2001   for CA   CARDIOVERSION N/A 02/26/2022   Procedure: CARDIOVERSION;  Surgeon: Donato Heinz, MD;  Location: [place];  Service: Cardiovascular;  Laterality: N/A;   COLONOSCOPY WITH PROPOFOL N/A 06/07/2018   Procedure: COLONOSCOPY WITH PROPOFOL;  Surgeon: Wilford Corner, MD;  Location: [facility];  Service: Endoscopy;  Laterality: N/A;   DEBRIDEMENT AND CLOSURE WOUND Left 12/28/2018   Procedure: Debridement And Closure Wound;  Surgeon: Coralie Keens, MD;  Location: [place];  Service: General;  Laterality: Left;   INCISION AND DRAINAGE  OF WOUND Left 01/27/2019   Procedure: IRRIGATION AND DEBRIDEMENT OF CHEST WALL;  Surgeon: Wallace Going, DO;  Location: [place];  Service: Plastics;  Laterality: Left;   KNEE ARTHROSCOPY WITH MEDIAL  MENISECTOMY Left 08/06/2021   Procedure: KNEE ARTHROSCOPY WITH PARTIAL MEDIAL MENISECTOMY, DEBRIDEMENT;  Surgeon: Susa Day, MD;  Location: [facility];  Service: Orthopedics;  Laterality: Left;  1 HR   LATISSIMUS FLAP TO BREAST Left 01/18/2019   Procedure: LEFT LATISSIMUS FLAP TO BREAST;  Surgeon: Wallace Going, DO;  Location: [place];  Service: Plastics;  Laterality: Left;   MASTECTOMY Left    MITRAL VALVE REPLACEMENT     mechanical   POLYPECTOMY  06/07/2018   Procedure: POLYPECTOMY;  Surgeon: Wilford Corner, MD;  [facility];  Service: Endoscopy;;   RIGHT/LEFT HEART CATH AND CORONARY ANGIOGRAPHY N/A 01/09/2021   Procedure: RIGHT/LEFT HEART CATH AND CORONARY ANGIOGRAPHY;  Surgeon: Jolaine Artist, MD;  Location: [place] [facility];  Service: Cardiovascular;  Laterality: N/A;   WOUND DEBRIDEMENT Left 12/28/2018   Procedure: EXCISION OF LEFT CHRONIC CHEST WALL WOUND;  Surgeon: Coralie Keens, MD;  Location: [place];  Service: General;  Laterality: Left;    Current Outpatient Medications  Medication Sig Dispense Refill   albuterol (VENTOLIN HFA) 108 (90 Base) MCG/ACT inhaler Inhale 2 puffs into the lungs every 6 (six) hours as needed for wheezing or shortness of breath. 8 g 2   amiodarone (PACERONE) 200 MG tablet Take 1 tablet (200 mg total) by mouth daily. 90 tablet 2   diphenhydrAMINE (BENADRYL) 25 MG tablet Take 75 mg by mouth daily as needed for allergies.     DULoxetine (CYMBALTA) 20 MG capsule Take 1 capsule (20 mg total) by mouth at bedtime. X 1 week, then 40 mg nightly x 1 week, then 60 mg nightly- for nerve pain 90 capsule 3   famotidine (PEPCID) 20 MG tablet Take 1 tablet (20 mg total) by mouth 2 (two) times daily. 60 tablet 0   FARXIGA 10 MG TABS tablet Take 1 tablet by mouth once daily 90 tablet 0   fluticasone (FLONASE) 50 MCG/ACT nasal spray Place 1 spray into both nostrils daily as needed for allergies. 9.9 mL 2   gabapentin (NEURONTIN) 600 MG tablet Take  1.5 tablets (900 mg total) by mouth 3 (three) times daily. Take 1 and 1/2 tablets by mouth three times a day for nerve pain 90 tablet 2   HYDROcodone-acetaminophen (NORCO) 5-325 MG tablet Take 1 tablet by mouth every 4 (four) hours as needed for moderate pain. 150 tablet 0   losartan (COZAAR) 25 MG tablet Take 0.5 tablets (12.5 mg total) by mouth daily. 45 tablet 2   metoprolol succinate (TOPROL-XL) 25 MG 24 hr tablet Take 1 tablet (25 mg total) by mouth daily. 90 tablet 2   montelukast (SINGULAIR) 10 MG tablet Take 1 tablet (10 mg total) by mouth at bedtime. (Patient taking differently: Take 10 mg by mouth at bedtime. As needed) 90 tablet 2   potassium chloride SA (KLOR-CON M) 20 MEQ tablet Take 2 tablets (40 mEq total) by mouth daily. 60 tablet 6   Simethicone (GAS-X PO) Take 1 tablet by mouth daily.     simvastatin (ZOCOR) 20 MG tablet Take 1 tablet (20 mg total) by mouth daily. 90 tablet 2   Spacer/Aero-Holding Chambers (BREATHERITE COLL SPACER ADULT) MISC 1 applicator by Does not apply route daily. 1 each 0   spironolactone (ALDACTONE) 25 MG tablet Take 1 tablet by  mouth once daily 90 tablet 3   Tiotropium Bromide-Olodaterol (STIOLTO RESPIMAT) 2.5-2.5 MCG/ACT AERS Inhale 2 puffs by mouth daily 4 g 0   warfarin (COUMADIN) 2.5 MG tablet Take 1 tablet (2.5 mg total) by mouth daily. (Patient taking differently: Take 0.5 mg by mouth daily.) 20 tablet 1   No current facility-administered medications for this visit.    Allergies  Allergen Reactions   Lisinopril Swelling   Acetaminophen-Codeine Itching   Propoxyphene Itching    Darvocet   Tape Rash    Plastic   Tramadol Itching, Nausea And Vomiting and Rash    Social History   Socioeconomic History   Marital status: Single    Spouse name: Not on file   Number of children: Not on file   Years of education: Not on file   Highest education level: Not on file  Occupational History   Not on file  Tobacco Use   Smoking status: Every Day     Packs/day: 0.10    Years: 30.00    Total pack years: 3.00    Types: Cigarettes   Smokeless tobacco: Never   Tobacco comments:    5 cigs per day  Vaping Use   Vaping Use: Never used  Substance and Sexual Activity   Alcohol use: Yes    Alcohol/week: 3.0 standard drinks of alcohol    Types: 3 Standard drinks or equivalent per week    Comment: beer- ocasional   Drug use: No   Sexual activity: Not Currently    Birth control/protection: None  Other Topics Concern   Not on file  Social History Narrative   ** Merged History Encounter **       Social Determinants of Health   Financial Resource Strain: Medium Risk (02/27/2021)   Overall Financial Resource Strain (CARDIA)    Difficulty of Paying Living Expenses: Somewhat hard  Food Insecurity: No Food Insecurity (02/24/2022)   Hunger Vital Sign    Worried About Running Out of Food in the Last Year: Never true    Ran Out of Food in the Last Year: Never true  Transportation Needs: No Transportation Needs (03/06/2022)   PRAPARE - Hydrologist (Medical): No    Lack of Transportation (Non-Medical): No  Physical Activity: Not on file  Stress: Not on file  Social Connections: Socially Isolated (11/28/2019)   Social Connection and Isolation Panel [NHANES]    Frequency of Communication with Friends and Family: More than three times a week    Frequency of Social Gatherings with Friends and Family: More than three times a week    Attends Religious Services: Never    Marine scientist or Organizations: No    Attends Music therapist: Not on file    Marital Status: Never married  Intimate Partner Violence: Not on file     ROS- All systems are reviewed and negative except as per the HPI above.  Physical Exam: There were no vitals filed for this visit.  GEN- The patient is a well appearing ***{Desc; female/female:11659}, alert and oriented x 3 today.   Head- normocephalic, atraumatic Eyes-   Sclera clear, conjunctiva pink Ears- hearing intact Oropharynx- clear Neck- supple  Lungs- Clear to ausculation bilaterally, normal work of breathing Heart- ***Regular rate and rhythm, no murmurs, rubs or gallops  GI- soft, NT, ND, + BS Extremities- no clubbing, cyanosis, or edema MS- no significant deformity or atrophy Skin- no rash or lesion Psych- euthymic mood, full affect  Neuro- strength and sensation are intact  Wt Readings from Last 3 Encounters:  04/10/22 72.9 kg  03/16/22 79.4 kg  03/13/22 79.2 kg    EKG today demonstrates  ***  Echo 02/18/22 demonstrated   1. Left ventricular ejection fraction, by estimation, is 35 to 40%. The  left ventricle has moderately decreased function. The left ventricle  demonstrates global hypokinesis. Left ventricular diastolic function could  not be evaluated.   2. Right ventricular systolic function is severely reduced. The right  ventricular size is severely enlarged. There is normal pulmonary artery  systolic pressure. The estimated right ventricular systolic pressure is  11.9 mmHg.   3. Left atrial size was severely dilated.   4. Right atrial size was severely dilated.   5. Prosthetic disc motion appears normal. Gradients are probably  increased due to tachycardia. The mitral valve has been repaired/replaced.  No evidence of mitral valve regurgitation. No evidence of mitral stenosis.  The mean mitral valve gradient is 8.0  mmHg with average heart rate of 107 bpm. There is a mechanical valve  present in the mitral position.   6. The tricuspid valve is abnormal. Tricuspid valve regurgitation is  severe.   7. The aortic valve is tricuspid. Aortic valve regurgitation is not  visualized. No aortic stenosis is present.   8. The inferior vena cava is dilated in size with <50% respiratory  variability, suggesting right atrial pressure of 15 mmHg.   Comparison(s): Prior images reviewed side by side. The left ventricular  function is  unchanged. The right ventricular systolic function is  significantly worse. Tricuspid regurgitation is substantially worse.  Although mitral prosthesis gradients are higher, this is probably explained by tachycardia, rather than prosthetic valve stenosis/dysfunction.   Epic records are reviewed at length today  CHA2DS2-VASc Score =    The patient's score is based upon:   {Click here to calculate score.  REFRESH note before signing. :1}     ASSESSMENT AND PLAN: {Select the correct AFib Diagnosis                 :[id number]}   *** With her severe biatrial enlargement and mechanical MV she is not an ideal ablation candidate.  Continue amiodarone 200 mg daily. Check TSH today. Continue Toprol 25 mg daily Continue warfarin   3. Obesity There is no height or weight on file to calculate BMI. Lifestyle modification was discussed at length including regular exercise and weight reduction. ***  4. HTN Stable, no changes today. ***  5. Chronic combined systolic and diastolic CHF EF 18-56% Fluid status appears stable Followed in [facility] ***  6. Valvular heart disease S/p mech MVR 2001 Continue warfarin Severe TR Followed by Dr Haroldine Laws   7. CAD No anginal symptoms On statin ***   Follow up ***   Adline Peals PA-C [facility] [place], [place] [address] [phone] 05/04/2022 2:29 PM

## 2022-05-05 ENCOUNTER — Ambulatory Visit (HOSPITAL_COMMUNITY): Payer: Medicare Other | Admitting: Physician Assistant

## 2022-05-11 ENCOUNTER — Ambulatory Visit (HOSPITAL_COMMUNITY)
Admission: RE | Admit: 2022-05-11 | Discharge: 2022-05-11 | Disposition: A | Discharge status: Home / Self Care | Payer: Medicare Other | Source: Ambulatory Visit | Attending: Family Medicine | Admitting: Family Medicine

## 2022-05-11 DIAGNOSIS — I2510 Atherosclerotic heart disease of native coronary artery without angina pectoris: Secondary | ICD-10-CM | POA: Diagnosis not present

## 2022-05-11 DIAGNOSIS — E785 Hyperlipidemia, unspecified: Secondary | ICD-10-CM | POA: Diagnosis not present

## 2022-05-11 DIAGNOSIS — I110 Hypertensive heart disease with heart failure: Secondary | ICD-10-CM | POA: Insufficient documentation

## 2022-05-11 DIAGNOSIS — I5042 Chronic combined systolic (congestive) and diastolic (congestive) heart failure: Secondary | ICD-10-CM | POA: Diagnosis not present

## 2022-05-11 DIAGNOSIS — J449 Chronic obstructive pulmonary disease, unspecified: Secondary | ICD-10-CM | POA: Insufficient documentation

## 2022-05-11 DIAGNOSIS — I509 Heart failure, unspecified: Secondary | ICD-10-CM | POA: Diagnosis not present

## 2022-05-11 LAB — ECHOCARDIOGRAM COMPLETE
Area-P 1/2: 2.76 cm2
Calc EF: 42 %
MV VTI: 1.71 cm2
S' Lateral: 3.9 cm
Single Plane A2C EF: 43.2 %
Single Plane A4C EF: 40.6 %

## 2022-05-11 NOTE — Progress Notes (Signed)
Echocardiogram 2D Echocardiogram has been performed.  Kim Anthony 05/11/2022, 9:06 AM

## 2022-05-12 ENCOUNTER — Telehealth (HOSPITAL_COMMUNITY): Payer: Self-pay | Admitting: Surgery

## 2022-05-12 NOTE — Telephone Encounter (Signed)
I called to remind patient to complete the ordered home sleep study.  She says that she will be traveling soon and will complete when she can.  I encouraged that she can do it anywhere as long as there is cell service and wifi.

## 2022-05-13 ENCOUNTER — Other Ambulatory Visit (HOSPITAL_COMMUNITY): Payer: Medicare Other

## 2022-05-13 ENCOUNTER — Telehealth (HOSPITAL_COMMUNITY): Payer: Self-pay | Admitting: Cardiology

## 2022-05-13 DIAGNOSIS — I5042 Chronic combined systolic (congestive) and diastolic (congestive) heart failure: Secondary | ICD-10-CM

## 2022-05-13 MED ORDER — POTASSIUM CHLORIDE CRYS ER 20 MEQ PO TBCR: 40.0000 meq | EXTENDED_RELEASE_TABLET | Freq: Two times a day (BID) | ORAL | 6 refills | Status: DC

## 2022-05-13 MED ORDER — TORSEMIDE 20 MG PO TABS: 40.0000 mg | ORAL_TABLET | Freq: Every day | ORAL | 3 refills | Status: DC

## 2022-05-13 NOTE — Telephone Encounter (Signed)
-----   Message from Rafael Bihari, [place] sent at 05/12/2022  4:50 PM EST ----- EF about the same from prior, 35-40%, severely reduced RV function, mechanical mitral valve stable, severe tricuspid regurgitation.   IVC dilated on echo, suggesting she has fluid on board.   Please increase torsemide to 40 mg daily and increase KCL to 40 bid.  She will need a BMET in 10 days

## 2022-05-13 NOTE — Telephone Encounter (Signed)
Patient called.  Patient aware.  

## 2022-05-17 ENCOUNTER — Other Ambulatory Visit: Payer: Self-pay | Admitting: Pharmacist

## 2022-05-17 DIAGNOSIS — Z952 Presence of prosthetic heart valve: Secondary | ICD-10-CM | POA: Diagnosis not present

## 2022-05-17 DIAGNOSIS — Z7901 Long term (current) use of anticoagulants: Secondary | ICD-10-CM | POA: Diagnosis not present

## 2022-05-17 DIAGNOSIS — I2782 Chronic pulmonary embolism: Secondary | ICD-10-CM | POA: Diagnosis not present

## 2022-05-17 MED ORDER — WARFARIN SODIUM 5 MG PO TABS: ORAL_TABLET | ORAL | 2 refills | Status: DC

## 2022-05-18 ENCOUNTER — Telehealth: Payer: Self-pay

## 2022-05-18 NOTE — Telephone Encounter (Signed)
#  20 with 2 refills sent yesterday to [facility]. Patient notified to contact pharmacy for refill.

## 2022-05-18 NOTE — Telephone Encounter (Signed)
Refill Request- Pt is out  warfarin (COUMADIN) 5 MG tablet   [id number] - [place], [place]

## 2022-05-19 ENCOUNTER — Telehealth: Payer: Self-pay | Admitting: *Deleted

## 2022-05-19 NOTE — Telephone Encounter (Signed)
Received fax from [facility]. INR test result 1.3 done on 05/17/22 @ 1732 PM. Thanks

## 2022-05-20 ENCOUNTER — Telehealth: Payer: Self-pay

## 2022-05-20 MED ORDER — HYDROCODONE-ACETAMINOPHEN 5-325 MG PO TABS: 1.0000 | ORAL_TABLET | ORAL | 0 refills | Status: DC | PRN

## 2022-05-20 NOTE — Telephone Encounter (Signed)
Dr. Florentina Jenny patient called and need a refill on Hydrocodone/APAP last filled #150 on 04/22/22 next appt 06/29/22. Can you address in her absence?

## 2022-05-20 NOTE — Telephone Encounter (Signed)
Received secure chat from [name] stating INR was sent to him on Sunday by pt's daughter and instructions were given.

## 2022-05-20 NOTE — Telephone Encounter (Signed)
PMP was Reviewed.  Dr Dagoberto Ligas note was reviewed.  Call placed to Ms. Romanski hydrocodone e-scribed today.She verbalizes understanding.

## 2022-05-22 ENCOUNTER — Other Ambulatory Visit (HOSPITAL_COMMUNITY): Payer: Medicare Other

## 2022-05-28 ENCOUNTER — Other Ambulatory Visit: Payer: Self-pay | Admitting: Student

## 2022-05-28 DIAGNOSIS — G894 Chronic pain syndrome: Secondary | ICD-10-CM

## 2022-05-29 ENCOUNTER — Other Ambulatory Visit (HOSPITAL_COMMUNITY): Payer: Medicare Other

## 2022-05-29 ENCOUNTER — Telehealth: Payer: Self-pay | Admitting: Physical Medicine and Rehabilitation

## 2022-05-29 ENCOUNTER — Ambulatory Visit (HOSPITAL_COMMUNITY): Payer: Medicare Other | Admitting: Physician Assistant

## 2022-05-29 NOTE — Telephone Encounter (Signed)
Called patient, according to med list Nani Gasser, MD was the last prescriber of medication. I advised patient to call pharmacy and have them send the request to the correct provider.

## 2022-05-29 NOTE — Telephone Encounter (Signed)
Patient needs refill on Gabapentin.

## 2022-06-01 ENCOUNTER — Telehealth (HOSPITAL_COMMUNITY): Payer: Self-pay

## 2022-06-01 NOTE — Telephone Encounter (Signed)
Received a medical clearance form from [facility] , requesting Cardiology clearance for the following procedure: Left total knee replacement. Medical clearance form was signed by Dr. Haroldine Laws and successfully faxed to [phone] on {date:21329}. Form will be scanned into patients chart.

## 2022-06-02 ENCOUNTER — Ambulatory Visit: Payer: Self-pay

## 2022-06-02 NOTE — Patient Instructions (Signed)
Visit Information  Thank you for taking time to visit with me today. Please don't hesitate to contact me if I can be of assistance to you.   Following are the goals we discussed today:   Goals Addressed             This Visit's Progress    Monitor and Manage my CHF       Care Coordination Interventions: Provided education on low sodium diet Reviewed Heart Failure Action Plan in depth and provided written copy Discussed importance of daily weight and advised patient to weigh and record daily Reviewed role of diuretics in prevention of fluid overload and management of heart failure; Active listening / Reflection utilized  Problem [place] reviewed          Our next appointment is by telephone on 07/03/22 at 11 am  Please call the care guide team at [phone] if you need to cancel or reschedule your appointment.   If you are experiencing a Mental Health or [place] or need someone to talk to, please call 1-800-273-TALK (toll free, 24 hour hotline)  Patient verbalizes understanding of instructions and care plan provided today and agrees to view in [place]. Active MyChart status and patient understanding of how to access instructions and care plan via MyChart confirmed with patient.     Lazaro Arms RN, BSN, [facility]   Phone: [phone]

## 2022-06-02 NOTE — Patient Outreach (Signed)
  Care Coordination   Follow Up Visit Note   06/02/2022 Name: Kim Anthony [name]: [id number] DOB: [birth year]  Kim Anthony is a 71 y.o. year old female who sees Kim Coach, MD for primary care. I spoke with  Kim Anthony by phone today.  What matters to the patients health and wellness today?  I had a conversation with [name] and she informed me that she is doing well, apart from having a cold and a stuffy nose for approximately three weeks. She is taking medication for her cough and cogestion. I suggested that she should drink hot liquids, such as tea, to help her get rid of the phlegm. She denies experiencing any chest pain, shortness of breath or swelling. [name] weighs 160 pounds. She is currently weighing herself twice a week but is willing to consider weighing herself more often once she understands the importance of doing so. She is taking her medications as prescribed.    Goals Addressed             This Visit's Progress    Monitor and Manage my CHF       Care Coordination Interventions: Provided education on low sodium diet Reviewed Heart Failure Action Plan in depth and provided written copy Discussed importance of daily weight and advised patient to weigh and record daily Reviewed role of diuretics in prevention of fluid overload and management of heart failure; Active listening / Reflection utilized  Problem [place] strategies reviewed          SDOH assessments and interventions completed:  No     Care Coordination Interventions:  Yes, provided   Follow up plan: Follow up call scheduled for 07/03/22 11 am    Encounter Outcome:  Pt. Visit Completed   Lazaro Arms RN, BSN, [facility]   Phone: [phone]

## 2022-06-03 ENCOUNTER — Other Ambulatory Visit: Payer: Self-pay

## 2022-06-03 ENCOUNTER — Other Ambulatory Visit (HOSPITAL_COMMUNITY): Payer: Medicare Other

## 2022-06-03 DIAGNOSIS — K219 Gastro-esophageal reflux disease without esophagitis: Secondary | ICD-10-CM

## 2022-06-03 DIAGNOSIS — I5042 Chronic combined systolic (congestive) and diastolic (congestive) heart failure: Secondary | ICD-10-CM

## 2022-06-03 DIAGNOSIS — G894 Chronic pain syndrome: Secondary | ICD-10-CM

## 2022-06-03 MED ORDER — METOPROLOL SUCCINATE ER 25 MG PO TB24: 25.0000 mg | ORAL_TABLET | Freq: Every day | ORAL | 2 refills | Status: DC

## 2022-06-03 MED ORDER — FAMOTIDINE 20 MG PO TABS: 20.0000 mg | ORAL_TABLET | Freq: Two times a day (BID) | ORAL | 0 refills | Status: DC

## 2022-06-03 MED ORDER — GABAPENTIN 600 MG PO TABS: ORAL_TABLET | ORAL | 0 refills | Status: DC

## 2022-06-08 ENCOUNTER — Encounter (HOSPITAL_COMMUNITY): Payer: Self-pay

## 2022-06-08 ENCOUNTER — Ambulatory Visit (HOSPITAL_COMMUNITY): Payer: Medicare Other | Admitting: Physician Assistant

## 2022-06-08 NOTE — Progress Notes (Incomplete)
Primary Care Physician: Iona Coach, MD Primary Cardiologist: Dr Haroldine Laws  Primary Electrophysiologist: none Referring Physician: Allena Katz    Kim Anthony is a 71 y.o. female with a history of breast cancer s/p radiation/chemo/mastectomy, tobacco use, COPD, prior DVT/PE, HTN, CAD, chronic systolic CHF, atrial fibrillation who presents for consultation in the [facility].  Patient was admitted 8/23 w/ a/c CHF and AFL. Echo showed EF 35-40%, LV global HK, severe RV dysfunction, mean mitral valve gradient 8 mmHg, severe TR. Diuresed with IV lasix and started on oral amiodarone load. Underwent TEE showing LVEF 35-40%, global HK, hypokinetic RV poorly visualized, no thrombus in LAA. Unfortunately, patient had increased secretions and laryngospams and test aborted. Later underwent DCCV on 02/26/22 from AFL to NSR. She was discharged home on amiodarone.   Admitted 9/23 with facial swelling. Resolved and referred to allergy. QTc long on ECG (QTc 410 msec on ECG 03/09/22), and amiodarone then stopped.   Follow up 9/23, NYHA II and volume up. Torsemide 20 mg daily restarted. Amiodarone restarted. Patient is on warfarin for a CHADS2VASC score of ***.  On follow up today, ***  Today, she denies symptoms of ***palpitations, chest pain, shortness of breath, orthopnea, PND, lower extremity edema, dizziness, presyncope, syncope, snoring, daytime somnolence, bleeding, or neurologic sequela. The patient is tolerating medications without difficulties and is otherwise without complaint today.    Atrial Fibrillation Risk Factors:  she does not have symptoms or diagnosis of sleep apnea. she does not have a history of rheumatic fever. she {Action; does/does not:19097} have a history of alcohol use. The patient {Action; does/does not:19097} have a history of early familial atrial fibrillation or other arrhythmias.  she has a BMI of There is no height or weight on file to  calculate BMI.. There were no vitals filed for this visit.  Family History  Problem Relation Age of Onset   Hypertension Mother    Cancer Father    Hypertension Father    Breast cancer Maternal Aunt 66   Heart attack Other      Atrial Fibrillation Management history:  Previous antiarrhythmic drugs: amiodarone  Previous cardioversions: 02/26/22 Previous ablations: none CHADS2VASC score: 5 Anticoagulation history: warfarin    Past Medical History:  Diagnosis Date   Arthritis    Asthma    Breast cancer ([place]) 2001   Left Breast Cancer   CHF (congestive heart failure) (HCC)    COPD (chronic obstructive pulmonary disease) ([place])    Coronary artery disease    GERD (gastroesophageal reflux disease)    History of mitral valve replacement with mechanical valve    HLD (hyperlipidemia)    Hypertension    Pulmonary embolism ([place]) 2021   Past Surgical History:  Procedure Laterality Date   APPLICATION OF A-CELL OF CHEST/ABDOMEN Left 01/27/2019   Procedure: APPLICATION OF A-CELL OF CHEST;  Surgeon: Wallace Going, DO;  Location: [place];  Service: Plastics;  Laterality: Left;   APPLICATION OF WOUND VAC N/A 01/27/2019   Procedure: APPLICATION OF WOUND VAC;  Surgeon: Wallace Going, DO;  Location: [place];  Service: Plastics;  Laterality: N/A;   BREAST SURGERY Left 2001   for CA   CARDIOVERSION N/A 02/26/2022   Procedure: CARDIOVERSION;  Surgeon: Donato Heinz, MD;  Location: [place];  Service: Cardiovascular;  Laterality: N/A;   COLONOSCOPY WITH PROPOFOL N/A 06/07/2018   Procedure: COLONOSCOPY WITH PROPOFOL;  Surgeon: Wilford Corner, MD;  [facility]: [facility];  Service: Endoscopy;  Laterality:  N/A;   DEBRIDEMENT AND CLOSURE WOUND Left 12/28/2018   Procedure: Debridement And Closure Wound;  Surgeon: Coralie Keens, MD;  Location: [place];  Service: General;  Laterality: Left;   INCISION AND DRAINAGE OF WOUND Left 01/27/2019   Procedure: IRRIGATION AND  DEBRIDEMENT OF CHEST WALL;  Surgeon: Wallace Going, DO;  Location: [place];  Service: Plastics;  Laterality: Left;   KNEE ARTHROSCOPY WITH MEDIAL MENISECTOMY Left 08/06/2021   Procedure: KNEE ARTHROSCOPY WITH PARTIAL MEDIAL MENISECTOMY, DEBRIDEMENT;  Surgeon: Susa Day, MD;  Location: [facility];  Service: Orthopedics;  Laterality: Left;  1 HR   LATISSIMUS FLAP TO BREAST Left 01/18/2019   Procedure: LEFT LATISSIMUS FLAP TO BREAST;  Surgeon: Wallace Going, DO;  Location: [place];  Service: Plastics;  Laterality: Left;   MASTECTOMY Left    MITRAL VALVE REPLACEMENT     mechanical   POLYPECTOMY  06/07/2018   Procedure: POLYPECTOMY;  Surgeon: Wilford Corner, MD;  [facility];  Service: Endoscopy;;   RIGHT/LEFT HEART CATH AND CORONARY ANGIOGRAPHY N/A 01/09/2021   Procedure: RIGHT/LEFT HEART CATH AND CORONARY ANGIOGRAPHY;  Surgeon: Jolaine Artist, MD;  Location: [facility];  Service: Cardiovascular;  Laterality: N/A;   WOUND DEBRIDEMENT Left 12/28/2018   Procedure: EXCISION OF LEFT CHRONIC CHEST WALL WOUND;  Surgeon: Coralie Keens, MD;  Location: [place];  Service: General;  Laterality: Left;    Current Outpatient Medications  Medication Sig Dispense Refill   albuterol (VENTOLIN HFA) 108 (90 Base) MCG/ACT inhaler Inhale 2 puffs into the lungs every 6 (six) hours as needed for wheezing or shortness of breath. 8 g 2   amiodarone (PACERONE) 200 MG tablet Take 1 tablet (200 mg total) by mouth daily. 90 tablet 2   diphenhydrAMINE (BENADRYL) 25 MG tablet Take 75 mg by mouth daily as needed for allergies.     DULoxetine (CYMBALTA) 20 MG capsule Take 1 capsule (20 mg total) by mouth at bedtime. X 1 week, then 40 mg nightly x 1 week, then 60 mg nightly- for nerve pain 90 capsule 3   famotidine (PEPCID) 20 MG tablet Take 1 tablet (20 mg total) by mouth 2 (two) times daily. 60 tablet 0   FARXIGA 10 MG TABS tablet Take 1 tablet by mouth once daily 90 tablet 0    fluticasone (FLONASE) 50 MCG/ACT nasal spray Place 1 spray into both nostrils daily as needed for allergies. 9.9 mL 2   gabapentin (NEURONTIN) 600 MG tablet TAKE 1 & 1/2 (ONE & ONE-HALF) TABLETS BY MOUTH THREE TIMES DAILY 90 tablet 0   HYDROcodone-acetaminophen (NORCO) 5-325 MG tablet Take 1 tablet by mouth every 4 (four) hours as needed for moderate pain. 150 tablet 0   losartan (COZAAR) 25 MG tablet Take 0.5 tablets (12.5 mg total) by mouth daily. 45 tablet 2   metoprolol succinate (TOPROL-XL) 25 MG 24 hr tablet Take 1 tablet (25 mg total) by mouth daily. 90 tablet 2   montelukast (SINGULAIR) 10 MG tablet Take 1 tablet (10 mg total) by mouth at bedtime. (Patient taking differently: Take 10 mg by mouth at bedtime. As needed) 90 tablet 2   potassium chloride SA (KLOR-CON M) 20 MEQ tablet Take 2 tablets (40 mEq total) by mouth 2 (two) times daily. 120 tablet 6   Simethicone (GAS-X PO) Take 1 tablet by mouth daily.     simvastatin (ZOCOR) 20 MG tablet Take 1 tablet (20 mg total) by mouth daily. 90 tablet 2   Spacer/Aero-Holding Chambers (BREATHERITE  COLL SPACER ADULT) MISC 1 applicator by Does not apply route daily. 1 each 0   spironolactone (ALDACTONE) 25 MG tablet Take 1 tablet by mouth once daily 90 tablet 3   Tiotropium Bromide-Olodaterol (STIOLTO RESPIMAT) 2.5-2.5 MCG/ACT AERS Inhale 2 puffs by mouth daily 4 g 0   torsemide (DEMADEX) 20 MG tablet Take 2 tablets (40 mg total) by mouth daily. 60 tablet 3   warfarin (COUMADIN) 5 MG tablet Give one (1) tablet on Sundays and Wednesdays. On all other days, give only one-half (1/2) tablet 20 tablet 2   No current facility-administered medications for this visit.    Allergies  Allergen Reactions   Lisinopril Swelling   Acetaminophen-Codeine Itching   Propoxyphene Itching    Darvocet   Tape Rash    Plastic   Tramadol Itching, Nausea And Vomiting and Rash    Social History   Socioeconomic History   Marital status: Single    Spouse name: Not  on file   Number of children: Not on file   Years of education: Not on file   Highest education level: Not on file  Occupational History   Not on file  Tobacco Use   Smoking status: Every Day    Packs/day: 0.10    Years: 30.00    Total pack years: 3.00    Types: Cigarettes   Smokeless tobacco: Never   Tobacco comments:    5 cigs per day  Vaping Use   Vaping Use: Never used  Substance and Sexual Activity   Alcohol use: Yes    Alcohol/week: 3.0 standard drinks of alcohol    Types: 3 Standard drinks or equivalent per week    Comment: beer- ocasional   Drug use: No   Sexual activity: Not Currently    Birth control/protection: None  Other Topics Concern   Not on file  Social History Narrative   ** Merged History Encounter **       Social Determinants of Health   Financial Resource Strain: Medium Risk (02/27/2021)   Overall Financial Resource Strain (CARDIA)    Difficulty of Paying Living Expenses: Somewhat hard  Food Insecurity: No Food Insecurity (02/24/2022)   Hunger Vital Sign    Worried About Running Out of Food in the Last Year: Never true    Ran Out of Food in the Last Year: Never true  Transportation Needs: No Transportation Needs (03/06/2022)   PRAPARE - Hydrologist (Medical): No    Lack of Transportation (Non-Medical): No  Physical Activity: Not on file  Stress: Not on file  Social Connections: Socially Isolated (11/28/2019)   Social Connection and Isolation Panel [NHANES]    Frequency of Communication with Friends and Family: More than three times a week    Frequency of Social Gatherings with Friends and Family: More than three times a week    Attends Religious Services: Never    Marine scientist or Organizations: No    Attends Music therapist: Not on file    Marital Status: Never married  Intimate Partner Violence: Not on file     ROS- All systems are reviewed and negative except as per the HPI  above.  Physical Exam: There were no vitals filed for this visit.  GEN- The patient is a well appearing ***{Desc; female/female:11659}, alert and oriented x 3 today.   Head- normocephalic, atraumatic Eyes-  Sclera clear, conjunctiva pink Ears- hearing intact Oropharynx- clear Neck- supple  Lungs- Clear to ausculation  bilaterally, normal work of breathing Heart- ***Regular rate and rhythm, no murmurs, rubs or gallops  GI- soft, NT, ND, + BS Extremities- no clubbing, cyanosis, or edema MS- no significant deformity or atrophy Skin- no rash or lesion Psych- euthymic mood, full affect Neuro- strength and sensation are intact  Wt Readings from Last 3 Encounters:  04/10/22 72.9 kg  03/16/22 79.4 kg  03/13/22 79.2 kg    EKG today demonstrates  ***  Echo 05/11/22 demonstrated  1. Left ventricular ejection fraction, by estimation, is 35 to 40%. The  left ventricle has moderately decreased function. The left ventricle  demonstrates global hypokinesis. Left ventricular diastolic function could  not be evaluated. The average left ventricular global longitudinal strain is -11.3 %. The global longitudinal strain is abnormal.   2. Right ventricular systolic function is severely reduced. The right  ventricular size is severely enlarged. There is moderately elevated  pulmonary artery systolic pressure. The estimated right ventricular  systolic pressure is 82.9 mmHg.   3. Left atrial size was moderately dilated.   4. Right atrial size was severely dilated.   5. The mitral valve has been repaired/replaced. No evidence of mitral  valve regurgitation. The mean mitral valve gradient is 3.0 mmHg. There is  a mechanical valve present in the mitral position. Procedure Date: 2001.  Echo findings are consistent with  normal structure and function of the mitral valve prosthesis.   6. Tricuspid valve regurgitation is severe.   7. The aortic valve is grossly normal. Aortic valve regurgitation is not   visualized. No aortic stenosis is present.   8. The inferior vena cava is dilated in size with <50% respiratory  variability, suggesting right atrial pressure of 15 mmHg.   Epic records are reviewed at length today   CHA2DS2-VASc Score =    The patient's score is based upon:   {Click here to calculate score.  REFRESH note before signing. :1}     ASSESSMENT AND PLAN: {Select the correct AFib Diagnosis                 :[id number]}  *** She is likely not an ablation candidate with mechanical MV. QT usually 470s-490s ms in SR, too long for dofetilide. Would avoid class IC and Multaq with h/o CAD and HFrEF. Amiodarone may be her best AAD option. Will refer her to establish care with EP and also discuss options.  Continue amiodarone 200 mg daily Continue warfarin Continue Toprol 25 mg daily  3. Chronic combined systolic and diastolic CHF EF 84-69%, severely reduced RV function GDMT per [facility] Appears euvolemic today. ***  4. Suspected obstructive sleep apnea The importance of adequate treatment of sleep apnea was discussed today in order to improve our ability to maintain sinus rhythm long term. Home sleep study ordered per [facility].  ***not completed.  5. CAD 1 vessel with occluded mid RCA No anginal symptoms. ***  6. Valvular heart disease MV repair 2001 Severe TR ***  7. HTN Stable, no changes today. ***   Follow up ***   Adline Peals PA-C [facility] [address] [place], [place] [address] [phone] 06/08/2022 8:27 AM

## 2022-06-09 ENCOUNTER — Telehealth: Payer: Self-pay | Admitting: *Deleted

## 2022-06-09 NOTE — Telephone Encounter (Signed)
Received fax from [facility] with INR results from 11/03/21 to 06/09/22. Last INR 1.4 on 12/19. Results placed in Dr. Gladstone Pih office.

## 2022-06-09 NOTE — Telephone Encounter (Signed)
Daughter of the patient provides a result of 1.4 INR obtained from patient self testing, finger stick, point of care device in the home setting.  Has been on 22.'5mg'$  warfarin/wk. INCREASED to 27.'5mg'$  warfarin/wk.  REPEAT PST FS POC INR determination on 2-JAN-24.   No missed doses, no new medications.

## 2022-06-10 NOTE — Progress Notes (Signed)
Advanced Heart Failure Clinic Note   PCP: Iona Coach, MD HF Cardiologist: Dr. Haroldine Laws  HPI: Lestine Rahe is a 71 y.o. female with a history of mechanical MVR in 2001 in [place], [place], CAD, hx breast cancer s/p left mastectomy and radiation/chemo, tobacco use, COPD, prior PE/DVT, HTN, undergoing workup for possible inflammatory arthritis with Rheumatology, and systolic CHF.   EF (2017) 45-50% (no EF on file prior to this). Had improved to 55-60% on Echo in July 2020.  She was admitted 01/05/21 with A/C CHF exacerbation and likely COPD exacerbation.Echo with EF of 35-40%, previously 55-60% on echo July 2020. No significant MR. Mildly enlarged RV. Etiology for decline in EF was not certain so she was scheduled for [facility] to rule out obstructive CAD. R/LHC showed 1v CAD with occluded mRCA and stable hemodynamics.  Echo 08/01/21 EF 50-55% RV moderately down. MVR ok Mean gradient 2.7  Admitted 8/23 w/ a/c CHF and AFL. Echo showed EF 35-40%, LV global HK, severe RV dysfunction, mean mitral valve gradient 8 mmHg, severe TR. Diuresed with IV lasix and started on oral amiodarone load.. Underwent TEE showing LVEF 35-40%, global HK, hypokinetic RV poorly visualized, no thrombus in LAA. Unfortunately patient had increased secretions and laryngospams and test aborted. Later underwent DCCV from AFL to NSR. She was discharged home on amiodarone.  Admitted 9/23 with facial swelling. Resolved and referred to allergy. QTc long on ECG (QTc 410 msec on ECG 03/09/22), and amiodarone then stopped.  Follow up 9/23, NYHA II and volume up. Torsemide 20 mg daily restarted. Amiodarone restarted.  Today she returns for HF follow up. Losartan started at last visit. Has had bad URI for a bout 1 month but now getting better. Breathing back to baseline. No CP, orthopnea or PND. No edema. Has lost 15 pounds with torsemide. Still smoking a few cigs/day  - Echo 11/23 EF 35-40% Severe RV dysfunction MVR stable Mean gradient 3.  Severe TR  RVSP 48   Cardiac Studies:  - Echo (8/23): EF 35-40%, LV global HK, severe RV dysfunction, mean mitral valve gradient 8 mmHg, severe TR.   - Echo (2/23): EF 50-55%, RV moderately down, MVR ok  - Echo (7/22): EF 35-40% (Dr. Jerilee Field felt EF 45%)  - R/LHC (7/22): with 1v CAD with occluded mRCA, RA 9, PCW 14, CI 2.9.  - Echo (2020): EF 55-60%    Past Medical History:  Diagnosis Date   Arthritis    Asthma    Breast cancer ([place]) 2001   Left Breast Cancer   CHF (congestive heart failure) (HCC)    COPD (chronic obstructive pulmonary disease) ([place])    Coronary artery disease    GERD (gastroesophageal reflux disease)    History of mitral valve replacement with mechanical valve    HLD (hyperlipidemia)    Hypertension    Pulmonary embolism ([place]) 2021   Current Outpatient Medications  Medication Sig Dispense Refill   albuterol (VENTOLIN HFA) 108 (90 Base) MCG/ACT inhaler Inhale 2 puffs into the lungs every 6 (six) hours as needed for wheezing or shortness of breath. 8 g 2   amiodarone (PACERONE) 200 MG tablet Take 1 tablet (200 mg total) by mouth daily. 90 tablet 2   diphenhydrAMINE (BENADRYL) 25 MG tablet Take 75 mg by mouth daily as needed for allergies.     DULoxetine (CYMBALTA) 20 MG capsule Take 20 mg by mouth at bedtime.     famotidine (PEPCID) 20 MG tablet Take 1 tablet (20 mg total) by mouth  2 (two) times daily. 60 tablet 0   FARXIGA 10 MG TABS tablet Take 1 tablet by mouth once daily 90 tablet 0   fluticasone (FLONASE) 50 MCG/ACT nasal spray Place 1 spray into both nostrils daily as needed for allergies. 9.9 mL 2   gabapentin (NEURONTIN) 600 MG tablet TAKE 1 & 1/2 (ONE & ONE-HALF) TABLETS BY MOUTH THREE TIMES DAILY 90 tablet 0   HYDROcodone-acetaminophen (NORCO) 5-325 MG tablet Take 1 tablet by mouth every 4 (four) hours as needed for moderate pain. 150 tablet 0   losartan (COZAAR) 25 MG tablet Take 0.5 tablets (12.5 mg total) by mouth daily. 45 tablet 2    metoprolol succinate (TOPROL-XL) 25 MG 24 hr tablet Take 1 tablet (25 mg total) by mouth daily. 90 tablet 2   potassium chloride SA (KLOR-CON M) 20 MEQ tablet Take 2 tablets (40 mEq total) by mouth 2 (two) times daily. 120 tablet 6   Simethicone (GAS-X PO) Take 1 tablet by mouth daily.     simvastatin (ZOCOR) 20 MG tablet Take 1 tablet (20 mg total) by mouth daily. 90 tablet 2   Spacer/Aero-Holding Chambers (BREATHERITE COLL SPACER ADULT) MISC 1 applicator by Does not apply route daily. 1 each 0   spironolactone (ALDACTONE) 25 MG tablet Take 1 tablet by mouth once daily 90 tablet 3   Tiotropium Bromide-Olodaterol (STIOLTO RESPIMAT) 2.5-2.5 MCG/ACT AERS Inhale 2 puffs by mouth daily 4 g 0   torsemide (DEMADEX) 20 MG tablet Take 2 tablets (40 mg total) by mouth daily. 60 tablet 3   warfarin (COUMADIN) 5 MG tablet Give one (1) tablet on Sundays and Wednesdays. On all other days, give only one-half (1/2) tablet 20 tablet 2   No current facility-administered medications for this encounter.   Allergies  Allergen Reactions   Lisinopril Swelling   Acetaminophen-Codeine Itching   Propoxyphene Itching    Darvocet   Tape Rash    Plastic   Tramadol Itching, Nausea And Vomiting and Rash   Social History   Socioeconomic History   Marital status: Single    Spouse name: Not on file   Number of children: Not on file   Years of education: Not on file   Highest education level: Not on file  Occupational History   Not on file  Tobacco Use   Smoking status: Every Day    Packs/day: 0.10    Years: 30.00    Total pack years: 3.00    Types: Cigarettes   Smokeless tobacco: Never   Tobacco comments:    5 cigs per day  Vaping Use   Vaping Use: Never used  Substance and Sexual Activity   Alcohol use: Yes    Alcohol/week: 3.0 standard drinks of alcohol    Types: 3 Standard drinks or equivalent per week    Comment: beer- ocasional   Drug use: No   Sexual activity: Not Currently    Birth  control/protection: None  Other Topics Concern   Not on file  Social History Narrative   ** Merged History Encounter **       Social Determinants of Health   Financial Resource Strain: Medium Risk (02/27/2021)   Overall Financial Resource Strain (CARDIA)    Difficulty of Paying Living Expenses: Somewhat hard  Food Insecurity: No Food Insecurity (02/24/2022)   Hunger Vital Sign    Worried About Running Out of Food in the Last Year: Never true    Ran Out of Food in the Last Year: Never true  Transportation Needs: No Transportation Needs (03/06/2022)   PRAPARE - Hydrologist (Medical): No    Lack of Transportation (Non-Medical): No  Physical Activity: Not on file  Stress: Not on file  Social Connections: Socially Isolated (11/28/2019)   Social Connection and Isolation Panel [NHANES]    Frequency of Communication with Friends and Family: More than three times a week    Frequency of Social Gatherings with Friends and Family: More than three times a week    Attends Religious Services: Never    Marine scientist or Organizations: No    Attends Music therapist: Not on file    Marital Status: Never married  Human resources officer Violence: Not on file    Family History  Problem Relation Age of Onset   Hypertension Mother    Cancer Father    Hypertension Father    Breast cancer Maternal Aunt 50   Heart attack Other    BP 104/60   Pulse 88   Wt 70 kg (154 lb 6.4 oz)   SpO2 97%   BMI 25.69 kg/m   Wt Readings from Last 3 Encounters:  06/11/22 70 kg (154 lb 6.4 oz)  04/10/22 72.9 kg (160 lb 12.8 oz)  03/16/22 79.4 kg (175 lb)   PHYSICAL EXAM: General:  Thin No resp difficulty HEENT: normal Neck: supple. no JVD. Carotids 2+ bilat; no bruits. No lymphadenopathy or thryomegaly appreciated. Cor: PMI nondisplaced. Regular rate & rhythm. Mechanical s1 Soft TR Lungs: markedly decreased Abdomen: soft, nontender, nondistended. No  hepatosplenomegaly. No bruits or masses. Good bowel sounds. Extremities: no cyanosis, clubbing, rash, edema Neuro: alert & orientedx3, cranial nerves grossly intact. moves all 4 extremities w/o difficulty. Affect pleasant   ASSESSMENT & PLAN: Chronic combined CHF: - Echo (2017): EF 45-50% in setting of PE. Has mild to moderate RV dysfunction. MV prosthesis okay. - Echo (7/22): EF 35-40% this admit (Dr. Jerilee Field felt EF 45%), previously 55-60% in 2020.  - R/LHC (01/09/21): with 1v CAD mRCA chronically occluded and well compensated hemodynamics. - Echo (08/01/21): EF 50-55% RV moderately down. MVR ok Mean gradient 3  - Admitted 8/23 with AFL with RVR and a/c CHF. - Echo (8/23): EF 35-40%, LV global HK, severe RV dysfunction, mean mitral valve gradient 8 mmHg, severe TR. ? Tachy mediated reduction in EF. - Echo 11/23 EF 35-40% Severe RV dysfunction MVR stable Mean gradient 3. Severe TR  RVSP 48 - NYHA II, volume status much improved - Off losartan due to low BP - will not restart today (h/o angioedema with ACE-I, so no ARNi). - Continue torsemide 40 mg daily  - Continue spiro 25 mg daily. - Continue Toprol XL 25 mg daily. - Continue Farxiga 10 mg daily. - Labs today  2. AFL, paroxysmal  - s/p TEE and DCCV 9/23 to NSR. - She was instructed to stop amio due to long QTc (QTC 410 msec on recent ECG). Minimize QT pro-longing agents - She does not do well in AFL/AF. - Regular on exam today - Continue amiodarone 200 mg daily. Not ideal with her COPD. She has been referred to AF clinic to discuss AAD vs ablation options.  - Continue warfarin. INR followed by Dr. Elie Confer. - She has home sleep study, has not completed.  3. CAD  - 1 V CAD with occluded mid RCA on cath (7/22). - No s/s angina - No ASA with warfarin use. - Continue beta blocker + statin.  4. Hx of mechanical mitral valve replacement: - Mechanical MVR in [place] in 2001. - Echo (7/22) mean gradient of 4 mmHg with no  significant MR. - Echo 08/01/21 EF 50-55% RV moderately down. MVR ok Mean gradient 3  - Echo 8/23 mitral valve mean gradient 8 mmHg (was in AFL and volume overload) - TEE (9/23) poor images due to laryngospasm/airway issues. - Echo 11/23 EF 35-40% Severe RV dysfunction MVR stable Mean gradient 3. Severe TR  RVSP 48 - Continue Coumadin. Managed by Paulla Dolly PharmD - Continue SBE prophylaxis.   5. Tricuspid valve regurgitation - Mild to moderate by echo 3/23. - Severe on recent echo but tolerating well   6. Hx DVT/PE - On Coumadin. - INRs followed by Dr. Elie Confer.   7. HTN - Blood pressure marginal. No changes today  8. COPD with ongoing tobacco use - Continue inhalers. - Stressed need for smoking cessation  Glori Bickers, MD  10:11 AM

## 2022-06-11 ENCOUNTER — Encounter (HOSPITAL_COMMUNITY): Payer: Self-pay | Admitting: Internal Medicine

## 2022-06-11 ENCOUNTER — Ambulatory Visit (HOSPITAL_COMMUNITY)
Admission: RE | Admit: 2022-06-11 | Discharge: 2022-06-11 | Disposition: A | Discharge status: Home / Self Care | Payer: Medicare Other | Source: Ambulatory Visit | Attending: Internal Medicine | Admitting: Internal Medicine

## 2022-06-11 VITALS — BP 104/60 | HR 88 | Wt 154.4 lb

## 2022-06-11 DIAGNOSIS — J4489 Other specified chronic obstructive pulmonary disease: Secondary | ICD-10-CM | POA: Diagnosis not present

## 2022-06-11 DIAGNOSIS — Z952 Presence of prosthetic heart valve: Secondary | ICD-10-CM | POA: Insufficient documentation

## 2022-06-11 DIAGNOSIS — I071 Rheumatic tricuspid insufficiency: Secondary | ICD-10-CM

## 2022-06-11 DIAGNOSIS — J385 Laryngeal spasm: Secondary | ICD-10-CM | POA: Diagnosis not present

## 2022-06-11 DIAGNOSIS — Z716 Tobacco abuse counseling: Secondary | ICD-10-CM | POA: Diagnosis not present

## 2022-06-11 DIAGNOSIS — F17210 Nicotine dependence, cigarettes, uncomplicated: Secondary | ICD-10-CM | POA: Diagnosis not present

## 2022-06-11 DIAGNOSIS — I081 Rheumatic disorders of both mitral and tricuspid valves: Secondary | ICD-10-CM | POA: Insufficient documentation

## 2022-06-11 DIAGNOSIS — I2510 Atherosclerotic heart disease of native coronary artery without angina pectoris: Secondary | ICD-10-CM | POA: Diagnosis not present

## 2022-06-11 DIAGNOSIS — Z7984 Long term (current) use of oral hypoglycemic drugs: Secondary | ICD-10-CM | POA: Diagnosis not present

## 2022-06-11 DIAGNOSIS — Z86718 Personal history of other venous thrombosis and embolism: Secondary | ICD-10-CM | POA: Diagnosis not present

## 2022-06-11 DIAGNOSIS — J449 Chronic obstructive pulmonary disease, unspecified: Secondary | ICD-10-CM

## 2022-06-11 DIAGNOSIS — I5042 Chronic combined systolic (congestive) and diastolic (congestive) heart failure: Secondary | ICD-10-CM | POA: Diagnosis not present

## 2022-06-11 DIAGNOSIS — Z86711 Personal history of pulmonary embolism: Secondary | ICD-10-CM | POA: Diagnosis not present

## 2022-06-11 DIAGNOSIS — Z7901 Long term (current) use of anticoagulants: Secondary | ICD-10-CM | POA: Diagnosis not present

## 2022-06-11 DIAGNOSIS — Z79899 Other long term (current) drug therapy: Secondary | ICD-10-CM | POA: Diagnosis not present

## 2022-06-11 DIAGNOSIS — I110 Hypertensive heart disease with heart failure: Secondary | ICD-10-CM | POA: Insufficient documentation

## 2022-06-11 LAB — CBC
HCT: 46 % (ref 36.0–46.0)
Hemoglobin: 14.4 g/dL (ref 12.0–15.0)
MCH: 30.8 pg (ref 26.0–34.0)
MCHC: 31.3 g/dL (ref 30.0–36.0)
MCV: 98.3 fL (ref 80.0–100.0)
Platelets: 423 10*3/uL — ABNORMAL HIGH (ref 150–400)
RBC: 4.68 MIL/uL (ref 3.87–5.11)
RDW: 14.4 % (ref 11.5–15.5)
WBC: 7.8 10*3/uL (ref 4.0–10.5)
nRBC: 0 % (ref 0.0–0.2)

## 2022-06-11 LAB — BASIC METABOLIC PANEL
Anion gap: 10 (ref 5–15)
BUN: 30 mg/dL — ABNORMAL HIGH (ref 8–23)
CO2: 21 mmol/L — ABNORMAL LOW (ref 22–32)
Calcium: 9.1 mg/dL (ref 8.9–10.3)
Chloride: 103 mmol/L (ref 98–111)
Creatinine, Ser: 1.19 mg/dL — ABNORMAL HIGH (ref 0.44–1.00)
GFR, Estimated: 49 mL/min — ABNORMAL LOW (ref >60)
Glucose, Bld: 103 mg/dL — ABNORMAL HIGH (ref 70–99)
Potassium: 3.8 mmol/L (ref 3.5–5.1)
Sodium: 134 mmol/L — ABNORMAL LOW (ref 135–145)

## 2022-06-11 LAB — BRAIN NATRIURETIC PEPTIDE: B Natriuretic Peptide: 51.5 pg/mL (ref 0.0–100.0)

## 2022-06-11 LAB — PROTIME-INR
INR: 1.5 — ABNORMAL HIGH (ref 0.8–1.2)
Prothrombin Time: 18.1 seconds — ABNORMAL HIGH (ref 11.4–15.2)

## 2022-06-11 NOTE — Patient Instructions (Signed)
Labs done today. We will contact you only if your labs are abnormal.  No medication changes were made. Please continue all current medications as prescribed.  Your physician recommends that you schedule a follow-up appointment in: 6 months. Please contact our office in May 2024 to schedule a June 2024 appointment.   If you have any questions or concerns before your next appointment please send [place] a message through [place] or call our office at [phone].    TO LEAVE A MESSAGE FOR THE NURSE SELECT OPTION 2, PLEASE LEAVE A MESSAGE INCLUDING: YOUR NAME DATE OF BIRTH CALL BACK NUMBER REASON FOR CALL**this is important as we prioritize the call backs  YOU WILL RECEIVE A CALL BACK THE SAME DAY AS LONG AS YOU CALL BEFORE 4:00 PM   Do the following things EVERYDAY: Weigh yourself in the morning before breakfast. Write it down and keep it in a log. Take your medicines as prescribed Eat low salt foods--Limit salt (sodium) to 2000 mg per day.  Stay as active as you can everyday Limit all fluids for the day to less than 2 liters   At the [facility], you and your health needs are our priority. As part of our continuing mission to provide you with exceptional heart care, we have created designated Provider Care Teams. These Care Teams include your primary Cardiologist (physician) and Advanced Practice Providers (APPs- Physician Assistants and Nurse Practitioners) who all work together to provide you with the care you need, when you need it.   You may see any of the following providers on your designated Care Team at your next follow up: Dr Glori Bickers Dr Haynes Kerns, NP Lyda Jester, [place] Audry Riles, PharmD   Please be sure to bring in all your medications bottles to every appointment.

## 2022-06-17 DIAGNOSIS — M170 Bilateral primary osteoarthritis of knee: Secondary | ICD-10-CM | POA: Diagnosis not present

## 2022-06-18 ENCOUNTER — Telehealth: Payer: Self-pay

## 2022-06-18 ENCOUNTER — Other Ambulatory Visit: Payer: Self-pay | Admitting: Physical Medicine and Rehabilitation

## 2022-06-18 MED ORDER — HYDROCODONE-ACETAMINOPHEN 5-325 MG PO TABS: 1.0000 | ORAL_TABLET | ORAL | 0 refills | Status: DC | PRN

## 2022-06-23 ENCOUNTER — Other Ambulatory Visit: Payer: Self-pay

## 2022-06-23 ENCOUNTER — Telehealth: Payer: Self-pay | Admitting: *Deleted

## 2022-06-23 NOTE — Telephone Encounter (Signed)
   Pre-operative Risk Assessment    Patient Name: Kim Anthony  DOB: [birth year] [name]: [id number]      Request for Surgical Clearance    Procedure:   LEFT TOTAL KNEE REPLACEMENT   Date of Surgery:  Clearance TBD                                 Surgeon:  DR. Susa Day Surgeon's Group or Practice Name:  [facility] Phone number:  [phone] Fax number:  [phone] ATTN: Orson Slick   Type of Clearance Requested:   - Medical  - Pharmacy:  Hold Warfarin (Coumadin) (PT HAS PRIOR HX OF DVT AND PE AND MAY REQUIRE IVC FILTER)   Type of Anesthesia:  General    Additional requests/questions:    Jiles Prows   06/23/2022, 5:42 PM

## 2022-06-23 NOTE — Telephone Encounter (Signed)
warfarin (COUMADIN) 5 MG tablet   [facility] - [place], [place] - [place]

## 2022-06-23 NOTE — Telephone Encounter (Signed)
Next appt scheduled  07/02/22 with [name].

## 2022-06-24 DIAGNOSIS — M1712 Unilateral primary osteoarthritis, left knee: Secondary | ICD-10-CM | POA: Diagnosis not present

## 2022-06-24 DIAGNOSIS — M170 Bilateral primary osteoarthritis of knee: Secondary | ICD-10-CM | POA: Diagnosis not present

## 2022-06-24 MED ORDER — WARFARIN SODIUM 5 MG PO TABS: ORAL_TABLET | ORAL | 2 refills | Status: DC

## 2022-06-24 NOTE — Telephone Encounter (Signed)
1st attempt to reach pt regarding surgical clearance and the need for a tele visit.  Left pt a message to call back and ask for the preo team.

## 2022-06-24 NOTE — Telephone Encounter (Signed)
   Name: Kim Anthony  DOB: [birth year]  [name]: [id number]  Primary Cardiologist: Glori Bickers, MD   Preoperative team, please contact this patient and set up a phone call appointment for further preoperative risk assessment. Please obtain consent and complete medication review. Thank you for your help.  I confirm that guidance regarding antiplatelet and oral anticoagulation therapy has been completed and, if necessary, noted below.  Pt takes coumadin for history of PE/DVT which is managed by her PCP. Will defer to them on coumadin hold and decisions regarding IVC filter.    Ledora Bottcher, PA 06/24/2022, 7:51 AM [name]

## 2022-06-25 ENCOUNTER — Telehealth: Payer: Self-pay | Admitting: *Deleted

## 2022-06-25 NOTE — Telephone Encounter (Signed)
Pt has been scheduled for tele visit 09/11/22 10 am. She states she just got an injection in her knee yesterday and now the surgeon said to she will need to wait 3 months for her surgery. Pt thanked me for the help. Med rec and consent are done.       Patient Consent for Virtual Visit        TIFFAY PINETTE has provided verbal consent on 06/25/2022 for a virtual visit (video or telephone).   CONSENT FOR VIRTUAL VISIT FOR:  Shaune Pascal  By participating in this virtual visit I agree to the following:  I hereby voluntarily request, consent and authorize [place] and its employed or contracted physicians, physician assistants, nurse practitioners or other licensed health care professionals (the Practitioner), to provide me with telemedicine health care services (the "Services") as deemed necessary by the treating Practitioner. I acknowledge and consent to receive the Services by the Practitioner via telemedicine. I understand that the telemedicine visit will involve communicating with the Practitioner through live audiovisual communication technology and the disclosure of certain medical information by electronic transmission. I acknowledge that I have been given the opportunity to request an in-person assessment or other available alternative prior to the telemedicine visit and am voluntarily participating in the telemedicine visit.  I understand that I have the right to withhold or withdraw my consent to the use of telemedicine in the course of my care at any time, without affecting my right to future care or treatment, and that the Practitioner or I may terminate the telemedicine visit at any time. I understand that I have the right to inspect all information obtained and/or recorded in the course of the telemedicine visit and may receive copies of available information for a reasonable fee.  I understand that some of the potential risks of receiving the Services via telemedicine include:   Delay or interruption in medical evaluation due to technological equipment failure or disruption; Information transmitted may not be sufficient (e.g. poor resolution of images) to allow for appropriate medical decision making by the Practitioner; and/or  In rare instances, security protocols could fail, causing a breach of personal health information.  Furthermore, I acknowledge that it is my responsibility to provide information about my medical history, conditions and care that is complete and accurate to the best of my ability. I acknowledge that Practitioner's advice, recommendations, and/or decision may be based on factors not within their control, such as incomplete or inaccurate data provided by me or distortions of diagnostic images or specimens that may result from electronic transmissions. I understand that the practice of medicine is not an exact science and that Practitioner makes no warranties or guarantees regarding treatment outcomes. I acknowledge that a copy of this consent can be made available to me via my patient portal ([place]), or I can request a printed copy by calling the office of [place].    I understand that my insurance will be billed for this visit.   I have read or had this consent read to me. I understand the contents of this consent, which adequately explains the benefits and risks of the Services being provided via telemedicine.  I have been provided ample opportunity to ask questions regarding this consent and the Services and have had my questions answered to my satisfaction. I give my informed consent for the services to be provided through the use of telemedicine in my medical care

## 2022-06-25 NOTE — Telephone Encounter (Signed)
Pt has been scheduled for tele visit 09/11/22 10 am. She states she just got an injection in her knee yesterday and now the surgeon said to she will need to wait 3 months for her surgery. Pt thanked me for the help. Med rec and consent are done.     Patient Consent for Virtual Visit        Kim Anthony has provided verbal consent on 06/25/2022 for a virtual visit (video or telephone).   CONSENT FOR VIRTUAL VISIT FOR:  Kim Anthony  By participating in this virtual visit I agree to the following:  I hereby voluntarily request, consent and authorize [place] and its employed or contracted physicians, physician assistants, nurse practitioners or other licensed health care professionals (the Practitioner), to provide me with telemedicine health care services (the "Services") as deemed necessary by the treating Practitioner. I acknowledge and consent to receive the Services by the Practitioner via telemedicine. I understand that the telemedicine visit will involve communicating with the Practitioner through live audiovisual communication technology and the disclosure of certain medical information by electronic transmission. I acknowledge that I have been given the opportunity to request an in-person assessment or other available alternative prior to the telemedicine visit and am voluntarily participating in the telemedicine visit.  I understand that I have the right to withhold or withdraw my consent to the use of telemedicine in the course of my care at any time, without affecting my right to future care or treatment, and that the Practitioner or I may terminate the telemedicine visit at any time. I understand that I have the right to inspect all information obtained and/or recorded in the course of the telemedicine visit and may receive copies of available information for a reasonable fee.  I understand that some of the potential risks of receiving the Services via telemedicine include:   Delay or interruption in medical evaluation due to technological equipment failure or disruption; Information transmitted may not be sufficient (e.g. poor resolution of images) to allow for appropriate medical decision making by the Practitioner; and/or  In rare instances, security protocols could fail, causing a breach of personal health information.  Furthermore, I acknowledge that it is my responsibility to provide information about my medical history, conditions and care that is complete and accurate to the best of my ability. I acknowledge that Practitioner's advice, recommendations, and/or decision may be based on factors not within their control, such as incomplete or inaccurate data provided by me or distortions of diagnostic images or specimens that may result from electronic transmissions. I understand that the practice of medicine is not an exact science and that Practitioner makes no warranties or guarantees regarding treatment outcomes. I acknowledge that a copy of this consent can be made available to me via my patient portal ([place]), or I can request a printed copy by calling the office of [place].    I understand that my insurance will be billed for this visit.   I have read or had this consent read to me. I understand the contents of this consent, which adequately explains the benefits and risks of the Services being provided via telemedicine.  I have been provided ample opportunity to ask questions regarding this consent and the Services and have had my questions answered to my satisfaction. I give my informed consent for the services to be provided through the use of telemedicine in my medical care

## 2022-06-26 ENCOUNTER — Telehealth: Payer: Self-pay | Admitting: *Deleted

## 2022-06-26 NOTE — Telephone Encounter (Signed)
Received faxed documentation from [facility]  INR Test Results: 1.5 Test Date/Time: 06/25/2022 8:02:00 PM  Will forward results to [name] and pcp

## 2022-06-29 ENCOUNTER — Encounter: Payer: 59 | Attending: Physical Medicine and Rehabilitation | Admitting: Physical Medicine and Rehabilitation

## 2022-06-29 ENCOUNTER — Encounter: Payer: Self-pay | Admitting: Physical Medicine and Rehabilitation

## 2022-06-29 VITALS — BP 157/85 | HR 73 | Ht 65.0 in | Wt 156.0 lb

## 2022-06-29 DIAGNOSIS — M25562 Pain in left knee: Secondary | ICD-10-CM | POA: Diagnosis not present

## 2022-06-29 DIAGNOSIS — G894 Chronic pain syndrome: Secondary | ICD-10-CM

## 2022-06-29 DIAGNOSIS — K219 Gastro-esophageal reflux disease without esophagitis: Secondary | ICD-10-CM

## 2022-06-29 DIAGNOSIS — G8929 Other chronic pain: Secondary | ICD-10-CM | POA: Diagnosis not present

## 2022-06-29 DIAGNOSIS — M25561 Pain in right knee: Secondary | ICD-10-CM | POA: Diagnosis not present

## 2022-06-29 MED ORDER — HYDROCODONE-ACETAMINOPHEN 5-325 MG PO TABS: 1.0000 | ORAL_TABLET | ORAL | 0 refills | Status: DC | PRN

## 2022-06-29 MED ORDER — PANTOPRAZOLE SODIUM 40 MG PO TBEC: 40.0000 mg | DELAYED_RELEASE_TABLET | Freq: Every day | ORAL | 0 refills | Status: DC

## 2022-06-29 NOTE — Progress Notes (Signed)
Subjective:    Patient ID: Kim Anthony, female    DOB: June 11, 1951, 72 y.o.   [name]: [id number]  HPI Patient is a 72 yr old female with aortic stenosis s/p AVR on Coumadin,  HTN, hx of PE's on chronic coumadin , CHF, and COPD- here for f/u of R side low back  Pain with sciatica.  Last ECHO 11/23- EF 35-40%, severely reduced RV function- stable mech mitral valve and severe tricuspid regurg.    Tramadol makes her ill as well as Tylenol #3 and Darvocet. Here for f/u of chronic pain.   Having a lot of acid reflux. Started Friday night- had chili Saturday and then again Sunday- beef then chicken chili-   Had to  get shots in knees last week- were swollen so big- and took fluid off them as well- last week- plans on B/L knee replacement- but will do L TKR first.  Last got pain meds 06/18/22- but didn't pick up til 06/24/22 per pt.      Pain Inventory Average Pain 7 Pain Right Now 9 My pain is constant and aching  In the last 24 hours, has pain interfered with the following? General activity 10 Relation with others 0 Enjoyment of life 0 What TIME of day is your pain at its worst? morning , daytime, evening, and night Sleep (in general) Poor  Pain is worse with: walking, bending, sitting, inactivity, standing, and some activites Pain improves with: rest and medication Relief from Meds: 4  Family History  Problem Relation Age of Onset   Hypertension Mother    Cancer Father    Hypertension Father    Breast cancer Maternal Aunt 50   Heart attack Other    Social History   Socioeconomic History   Marital status: Single    Spouse name: Not on file   Number of children: Not on file   Years of education: Not on file   Highest education level: Not on file  Occupational History   Not on file  Tobacco Use   Smoking status: Every Day    Packs/day: 0.10    Years: 30.00    Total pack years: 3.00    Types: Cigarettes   Smokeless tobacco: Never   Tobacco comments:    5 cigs per  day  Vaping Use   Vaping Use: Never used  Substance and Sexual Activity   Alcohol use: Yes    Alcohol/week: 3.0 standard drinks of alcohol    Types: 3 Standard drinks or equivalent per week    Comment: beer- ocasional   Drug use: No   Sexual activity: Not Currently    Birth control/protection: None  Other Topics Concern   Not on file  Social History Narrative   ** Merged History Encounter **       Social Determinants of Health   Financial Resource Strain: Medium Risk (02/27/2021)   Overall Financial Resource Strain (CARDIA)    Difficulty of Paying Living Expenses: Somewhat hard  Food Insecurity: No Food Insecurity (02/24/2022)   Hunger Vital Sign    Worried About Running Out of Food in the Last Year: Never true    Ran Out of Food in the Last Year: Never true  Transportation Needs: No Transportation Needs (03/06/2022)   PRAPARE - Hydrologist (Medical): No    Lack of Transportation (Non-Medical): No  Physical Activity: Not on file  Stress: Not on file  Social Connections: Socially Isolated (11/28/2019)   Social  Connection and Isolation Panel [NHANES]    Frequency of Communication with Friends and Family: More than three times a week    Frequency of Social Gatherings with Friends and Family: More than three times a week    Attends Religious Services: Never    Marine scientist or Organizations: No    Attends Music therapist: Not on file    Marital Status: Never married   Past Surgical History:  Procedure Laterality Date   APPLICATION OF A-CELL OF CHEST/ABDOMEN Left 01/27/2019   Procedure: APPLICATION OF A-CELL OF CHEST;  Surgeon: Wallace Going, DO;  Location: [place];  Service: Plastics;  Laterality: Left;   APPLICATION OF WOUND VAC N/A 01/27/2019   Procedure: APPLICATION OF WOUND VAC;  Surgeon: Wallace Going, DO;  Location: [place];  Service: Plastics;  Laterality: N/A;   BREAST SURGERY Left 2001   for CA    CARDIOVERSION N/A 02/26/2022   Procedure: CARDIOVERSION;  Surgeon: Donato Heinz, MD;  Location: [place];  Service: Cardiovascular;  Laterality: N/A;   COLONOSCOPY WITH PROPOFOL N/A 06/07/2018   Procedure: COLONOSCOPY WITH PROPOFOL;  Surgeon: Wilford Corner, MD;  [facility];  Service: Endoscopy;  Laterality: N/A;   DEBRIDEMENT AND CLOSURE WOUND Left 12/28/2018   Procedure: Debridement And Closure Wound;  Surgeon: Coralie Keens, MD;  Location: [place];  Service: General;  Laterality: Left;   INCISION AND DRAINAGE OF WOUND Left 01/27/2019   Procedure: IRRIGATION AND DEBRIDEMENT OF CHEST WALL;  Surgeon: Wallace Going, DO;  Location: [place];  Service: Plastics;  Laterality: Left;   KNEE ARTHROSCOPY WITH MEDIAL MENISECTOMY Left 08/06/2021   Procedure: KNEE ARTHROSCOPY WITH PARTIAL MEDIAL MENISECTOMY, DEBRIDEMENT;  Surgeon: Susa Day, MD;  [facility]: [facility];  Service: Orthopedics;  Laterality: Left;  1 HR   LATISSIMUS FLAP TO BREAST Left 01/18/2019   Procedure: LEFT LATISSIMUS FLAP TO BREAST;  Surgeon: Wallace Going, DO;  Location: [place];  Service: Plastics;  Laterality: Left;   MASTECTOMY Left    MITRAL VALVE REPLACEMENT     mechanical   POLYPECTOMY  06/07/2018   Procedure: POLYPECTOMY;  Surgeon: Wilford Corner, MD;  [facility];  Service: Endoscopy;;   RIGHT/LEFT HEART CATH AND CORONARY ANGIOGRAPHY N/A 01/09/2021   Procedure: RIGHT/LEFT HEART CATH AND CORONARY ANGIOGRAPHY;  Surgeon: Jolaine Artist, MD;  Location: [place] [facility];  Service: Cardiovascular;  Laterality: N/A;   WOUND DEBRIDEMENT Left 12/28/2018   Procedure: EXCISION OF LEFT CHRONIC CHEST WALL WOUND;  Surgeon: Coralie Keens, MD;  Location: [place];  Service: General;  Laterality: Left;   Past Surgical History:  Procedure Laterality Date   APPLICATION OF A-CELL OF CHEST/ABDOMEN Left 01/27/2019   Procedure: APPLICATION OF A-CELL OF CHEST;  Surgeon: Wallace Going, DO;  Location: [place];  Service: Plastics;  Laterality: Left;   APPLICATION OF WOUND VAC N/A 01/27/2019   Procedure: APPLICATION OF WOUND VAC;  Surgeon: Wallace Going, DO;  Location: [place];  Service: Plastics;  Laterality: N/A;   BREAST SURGERY Left 2001   for CA   CARDIOVERSION N/A 02/26/2022   Procedure: CARDIOVERSION;  Surgeon: Donato Heinz, MD;  Location: [place];  Service: Cardiovascular;  Laterality: N/A;   COLONOSCOPY WITH PROPOFOL N/A 06/07/2018   Procedure: COLONOSCOPY WITH PROPOFOL;  Surgeon: Wilford Corner, MD;  [facility];  Service: Endoscopy;  Laterality: N/A;   DEBRIDEMENT AND CLOSURE WOUND Left 12/28/2018   Procedure: Debridement And Closure Wound;  Surgeon: Coralie Keens, MD;  Location: [facility];  Service: General;  Laterality: Left;   INCISION AND DRAINAGE OF WOUND Left 01/27/2019   Procedure: IRRIGATION AND DEBRIDEMENT OF CHEST WALL;  Surgeon: Wallace Going, DO;  Location: [place];  Service: Plastics;  Laterality: Left;   KNEE ARTHROSCOPY WITH MEDIAL MENISECTOMY Left 08/06/2021   Procedure: KNEE ARTHROSCOPY WITH PARTIAL MEDIAL MENISECTOMY, DEBRIDEMENT;  Surgeon: Susa Day, MD;  [facility];  Service: Orthopedics;  Laterality: Left;  1 HR   LATISSIMUS FLAP TO BREAST Left 01/18/2019   Procedure: LEFT LATISSIMUS FLAP TO BREAST;  Surgeon: Wallace Going, DO;  Location: [place];  Service: Plastics;  Laterality: Left;   MASTECTOMY Left    MITRAL VALVE REPLACEMENT     mechanical   POLYPECTOMY  06/07/2018   Procedure: POLYPECTOMY;  Surgeon: Wilford Corner, MD;  [facility];  Service: Endoscopy;;   RIGHT/LEFT HEART CATH AND CORONARY ANGIOGRAPHY N/A 01/09/2021   Procedure: RIGHT/LEFT HEART CATH AND CORONARY ANGIOGRAPHY;  Surgeon: Jolaine Artist, MD;  Location: [place] [place] [facility];  Service: Cardiovascular;  Laterality: N/A;   WOUND DEBRIDEMENT Left 12/28/2018   Procedure: EXCISION OF LEFT CHRONIC  CHEST WALL WOUND;  Surgeon: Coralie Keens, MD;  Location: [place];  Service: General;  Laterality: Left;   Past Medical History:  Diagnosis Date   Arthritis    Asthma    Breast cancer ([place]) 2001   Left Breast Cancer   CHF (congestive heart failure) ([place])    COPD (chronic obstructive pulmonary disease) ([place])    Coronary artery disease    GERD (gastroesophageal reflux disease)    History of mitral valve replacement with mechanical valve    HLD (hyperlipidemia)    Hypertension    Pulmonary embolism ([place]) 2021   BP (!) 157/85   Pulse 73   Ht '5\' 5"'$  (1.651 m)   Wt 156 lb (70.8 kg)   SpO2 98%   BMI 25.96 kg/m   Opioid Risk Score:   Fall Risk Score:  `1  Depression screen [facility] 2/9     03/16/2022   10:57 AM 01/13/2022   11:32 AM 01/09/2022    9:02 AM 11/20/2021    8:51 AM 11/13/2021    9:14 AM 11/04/2021    2:55 PM 07/25/2021   10:14 AM  Depression screen PHQ 2/9  Decreased Interest 0 0 0 0 0 0 0  Down, Depressed, Hopeless 0 0 0 0 0 0 0  PHQ - 2 Score 0 0 0 0 0 0 0    Review of Systems  Musculoskeletal:  Positive for gait problem.       Pain in both knee & both hips  All other systems reviewed and are negative.      Objective:   Physical Exam Awake, alert, appropriate, using single point cane ot get around, NAD Has 4 pronged cane addition to it  TTP across entire B/L knees-  with severe crepitus of B/L knees Also has significant effusion of L>R knees       Assessment & Plan:   Patient is a 72 yr old female with aortic stenosis s/p AVR on Coumadin,  HTN, hx of PE's on chronic coumadin , CHF, and COPD- here for f/u of R side low back  Pain with sciatica. Due to get ECHO for CHF- still needs to get this.  Tramadol makes her ill as well as Tylenol #3 and Darvocet.  Sometimes bad reflux can mean heart issues, so will try  Protonix/Protoprazole- 40 mg daily- if doesn't get better with new medicine, you call PCP/ or Cardiologist about possible heart issues.  2.  Prednisone  can cause severe reflux- as well - so hopefully reflux will get better stopping prednisone and with new medicine.    3. Con't Norco 5/325 mg - will send refill for Norco- to be filled early February time frame- 5/325 q4 hours prn #150.    4. F/U  3 months- on chronic pain- UDS at that time.

## 2022-06-29 NOTE — Patient Instructions (Signed)
Patient is a 72 yr old female with aortic stenosis s/p AVR on Coumadin,  HTN, hx of PE's on chronic coumadin , CHF, and COPD- here for f/u of R side low back  Pain with sciatica. Due to get ECHO for CHF- still needs to get this.  Tramadol makes her ill as well as Tylenol #3 and Darvocet.  Sometimes bad reflux can mean heart issues, so will try Protonix/Protoprazole- 40 mg daily- if doesn't get better with new medicine, you call PCP/ or Cardiologist about possible heart issues.  2.  Prednisone can cause severe reflux- as well - so hopefully reflux will get better stopping prednisone and with new medicine.    3. Con't Norco 5/325 mg - will send refill for Norco- to be filled early February time frame- 5/325 q4 hours prn #150.    4. F/U  3 months- on chronic pain- UDS at that time.

## 2022-06-30 ENCOUNTER — Other Ambulatory Visit: Payer: Self-pay

## 2022-06-30 DIAGNOSIS — G894 Chronic pain syndrome: Secondary | ICD-10-CM

## 2022-07-01 ENCOUNTER — Other Ambulatory Visit: Payer: Self-pay | Admitting: Internal Medicine

## 2022-07-01 DIAGNOSIS — J449 Chronic obstructive pulmonary disease, unspecified: Secondary | ICD-10-CM

## 2022-07-02 ENCOUNTER — Encounter: Payer: Self-pay | Admitting: Student

## 2022-07-02 ENCOUNTER — Ambulatory Visit (INDEPENDENT_AMBULATORY_CARE_PROVIDER_SITE_OTHER): Payer: Medicare Other | Admitting: Student

## 2022-07-02 VITALS — BP 104/66 | HR 71 | Temp 98.3°F | Wt 156.2 lb

## 2022-07-02 DIAGNOSIS — F17210 Nicotine dependence, cigarettes, uncomplicated: Secondary | ICD-10-CM

## 2022-07-02 DIAGNOSIS — I5042 Chronic combined systolic (congestive) and diastolic (congestive) heart failure: Secondary | ICD-10-CM | POA: Diagnosis not present

## 2022-07-02 DIAGNOSIS — R9389 Abnormal findings on diagnostic imaging of other specified body structures: Secondary | ICD-10-CM

## 2022-07-02 DIAGNOSIS — I2609 Other pulmonary embolism with acute cor pulmonale: Secondary | ICD-10-CM | POA: Diagnosis not present

## 2022-07-02 DIAGNOSIS — Z952 Presence of prosthetic heart valve: Secondary | ICD-10-CM | POA: Diagnosis not present

## 2022-07-02 DIAGNOSIS — J449 Chronic obstructive pulmonary disease, unspecified: Secondary | ICD-10-CM | POA: Diagnosis not present

## 2022-07-02 DIAGNOSIS — N8500 Endometrial hyperplasia, unspecified: Secondary | ICD-10-CM

## 2022-07-02 MED ORDER — ALBUTEROL SULFATE HFA 108 (90 BASE) MCG/ACT IN AERS: 2.0000 | INHALATION_SPRAY | Freq: Four times a day (QID) | RESPIRATORY_TRACT | 5 refills | Status: DC | PRN

## 2022-07-02 MED ORDER — WARFARIN SODIUM 5 MG PO TABS: ORAL_TABLET | ORAL | 2 refills | Status: DC

## 2022-07-02 MED ORDER — STIOLTO RESPIMAT 2.5-2.5 MCG/ACT IN AERS: INHALATION_SPRAY | RESPIRATORY_TRACT | 6 refills | Status: DC

## 2022-07-03 ENCOUNTER — Ambulatory Visit: Payer: Self-pay

## 2022-07-03 MED ORDER — GABAPENTIN 600 MG PO TABS: ORAL_TABLET | ORAL | 0 refills | Status: DC

## 2022-07-03 NOTE — Patient Outreach (Signed)
  Care Coordination   Follow Up Visit Note   07/03/2022 Name: IDALIS HOELTING [name]: [id number] DOB: [birth year]  FRIEDA ARNALL is a 72 y.o. year old female who sees Iona Coach, MD for primary care. I spoke with  Shaune Pascal by phone today.  What matters to the patients health and wellness today?  I have not had any swelling but my knees have been bothering me.    Goals Addressed             This Visit's Progress    Monitor and Manage my CHF       Care Coordination Interventions: Provided education on low sodium diet Reviewed Heart Failure Action Plan in depth and provided written copy Discussed importance of daily weight and advised patient to weigh and record daily Reviewed role of diuretics in prevention of fluid overload and management of heart failure; Active listening / Reflection utilized  Problem [place] reviewed Mr. Stetzer is doing well and hasn't been experiencing any chest pain, shortness of breath or swelling lately. Her last recorded blood pressure was 104/66 and she weighed in at 156 lbs. She's been having trouble with her knees and has plans to get her left knee replaced in April. Although she doesn't know the exact date yet, she promised to keep me informed. She's been taking her medications as prescribed.          SDOH assessments and interventions completed:  Yes  SDOH Interventions Today    Flowsheet Row Most Recent Value  SDOH Interventions   Utilities Interventions Other (Comment)  [Care guides]        Care Coordination Interventions:  Yes, provided   Follow up plan: Follow up call scheduled for 32/13/24 10 am    Encounter Outcome:  Pt. Visit Completed   Lazaro Arms RN, BSN, [facility]   Phone: [phone]

## 2022-07-03 NOTE — Patient Instructions (Signed)
Visit Information  Thank you for taking time to visit with me today. Please don't hesitate to contact me if I can be of assistance to you.   Following are the goals we discussed today:   Goals Addressed             This Visit's Progress    Monitor and Manage my CHF       Care Coordination Interventions: Provided education on low sodium diet Reviewed Heart Failure Action Plan in depth and provided written copy Discussed importance of daily weight and advised patient to weigh and record daily Reviewed role of diuretics in prevention of fluid overload and management of heart failure; Active listening / Reflection utilized  Problem [place] reviewed Mr. Pospisil is doing well and hasn't been experiencing any chest pain, shortness of breath or swelling lately. Her last recorded blood pressure was 104/66 and she weighed in at 156 lbs. She's been having trouble with her knees and has plans to get her left knee replaced in April. Although she doesn't know the exact date yet, she promised to keep me informed. She's been taking her medications as prescribed.          Our next appointment is by telephone on 08/04/22 at 10 am  Please call the care guide team at [phone] if you need to cancel or reschedule your appointment.   If you are experiencing a Mental Health or [place] or need someone to talk to, please call [phone]-TALK (toll free, 24 hour hotline)  Patient verbalizes understanding of instructions and care plan provided today and agrees to view in [place]. Active MyChart status and patient understanding of how to access instructions and care plan via MyChart confirmed with patient.     Lazaro Arms RN, BSN, [facility]   Phone: [phone]

## 2022-07-07 DIAGNOSIS — R9389 Abnormal findings on diagnostic imaging of other specified body structures: Secondary | ICD-10-CM | POA: Insufficient documentation

## 2022-07-07 NOTE — Assessment & Plan Note (Signed)
>>  ASSESSMENT AND PLAN FOR HFREF (HEART FAILURE WITH REDUCED EJECTION FRACTION) (HCC) WRITTEN ON 09/14/2023 11:44 AM BY Janashia Parco, DO   >>ASSESSMENT AND PLAN FOR CHRONIC HFREF (HEART FAILURE WITH REDUCED EJECTION FRACTION) (HCC) WRITTEN ON 09/14/2023 11:43 AM BY Cataleyah Colborn, DO   >>ASSESSMENT AND PLAN FOR CHF (CONGESTIVE HEART FAILURE) (HCC) WRITTEN ON 07/07/2022  9:52 AM BY Marolyn Haller, MD  NYHA class I . Follows regularly with HF team. Euvolemic on exam.   Continue current regimen of metoprolol succinate, spironolactone, farxiga, and torsemide.

## 2022-07-07 NOTE — Assessment & Plan Note (Signed)
Refilled patient's inhaler therapy.

## 2022-07-07 NOTE — Assessment & Plan Note (Signed)
Refilled her coumadin

## 2022-07-07 NOTE — Progress Notes (Signed)
   CC: Medication refill   HPI:  [name] is a 72 y.o. F with PMH per below who presents to clinic for follow up of her chronic medical problems and for refill of her medications.   Regarding her HFrEF: Patient was recently seen by her HF team 06/11/22 about two weeks prior to today's visit. She was continued on farxiga, metop succinate, spironolactone, and torsemide. She states that she feels her breathing is doing well and that she can actually walk up a flight of stairs without dyspnea.    She is on coumadin for a MVR in 2001, VTE, and afib. Her most recent INR was 1.5. Her medications were appropriately adjusted by Dr. Elie Confer via messaging with the patient's daughter.   Past Medical History:  Diagnosis Date   Arthritis    Asthma    Breast cancer ([place]) 2001   Left Breast Cancer   CHF (congestive heart failure) (HCC)    COPD (chronic obstructive pulmonary disease) (HCC)    Coronary artery disease    GERD (gastroesophageal reflux disease)    History of mitral valve replacement with mechanical valve    HLD (hyperlipidemia)    Hypertension    Pulmonary embolism ([place]) 2021   Review of Systems:  Negative except per above.   Physical Exam:  Vitals:   07/02/22 1010  BP: 104/66  Pulse: 71  Temp: 98.3 F (36.8 C)  TempSrc: Oral  SpO2: 100%  Weight: 156 lb 3.2 oz (70.9 kg)    Constitutional: Well-developed, well-nourished, and in no distress.  Cardiovascular: Normal rate, regular rhythm, intact distal pulses. No gallop and no friction rub.  No murmur heard. No lower extremity edema  Pulmonary: Non labored breathing on room air, no wheezing or rales  Abdominal: Soft. Normal bowel sounds. Non distended and non tender Musculoskeletal: Normal range of motion.        General: No tenderness or edema.  Neurological: Alert and oriented to person, place, and time. Non focal  Skin: Skin is warm and dry.    Assessment & Plan:   See Encounters Tab for problem based  charting.  Patient discussed with Dr. Dareen Piano

## 2022-07-07 NOTE — Assessment & Plan Note (Signed)
>>  ASSESSMENT AND PLAN FOR CHF (CONGESTIVE HEART FAILURE) (HCC) WRITTEN ON 07/07/2022  9:52 AM BY Marolyn Haller, MD  NYHA class I . Follows regularly with HF team. Euvolemic on exam.   Continue current regimen of metoprolol succinate, spironolactone, farxiga, and torsemide.

## 2022-07-07 NOTE — Assessment & Plan Note (Signed)
>>  ASSESSMENT AND PLAN FOR CHRONIC HFREF (HEART FAILURE WITH REDUCED EJECTION FRACTION) (HCC) WRITTEN ON 09/14/2023 11:43 AM BY Sunni Richardson, DO   >>ASSESSMENT AND PLAN FOR CHF (CONGESTIVE HEART FAILURE) (HCC) WRITTEN ON 07/07/2022  9:52 AM BY Marolyn Haller, MD  NYHA class I . Follows regularly with HF team. Euvolemic on exam.   Continue current regimen of metoprolol succinate, spironolactone, farxiga, and torsemide.

## 2022-07-07 NOTE — Assessment & Plan Note (Signed)
Noted on TVUS which was obtained for AUB during LCV. Patient states that bleeding has subsided. It was recommended that patient be referred to GYN for endometrial sampling.   GYN referral was sent this clinic visit.

## 2022-07-07 NOTE — Assessment & Plan Note (Signed)
NYHA class I . Follows regularly with HF team. Euvolemic on exam.   Continue current regimen of metoprolol succinate, spironolactone, farxiga, and torsemide.

## 2022-07-08 NOTE — Progress Notes (Signed)
Internal Medicine Clinic Attending  Case discussed with Dr. Carter  At the time of the visit.  We reviewed the resident's history and exam and pertinent patient test results.  I agree with the assessment, diagnosis, and plan of care documented in the resident's note.  

## 2022-07-14 ENCOUNTER — Telehealth: Payer: Self-pay | Admitting: *Deleted

## 2022-07-15 ENCOUNTER — Telehealth: Payer: Self-pay | Admitting: Pharmacist

## 2022-07-15 NOTE — Telephone Encounter (Signed)
INR 1.7 on Monday 22JAN24 (target range 2.5 - 3.5) on 27.'5mg'$  warfarin/wk. INCREASED to 32.'5mg'$  warfarin per week; Repeat PST FS POC INR Monday 29JAN24. INCREASED dose to 37.5 milligrams warfarin/wk as: 1 & 1/2 x '5mg'$  (7.'5mg'$  dose) on Monday; 1x'5mg'$  all other days. REPEAT FS POC INR Monday [id number].

## 2022-07-22 ENCOUNTER — Other Ambulatory Visit: Payer: Self-pay

## 2022-07-22 DIAGNOSIS — G894 Chronic pain syndrome: Secondary | ICD-10-CM

## 2022-07-27 MED ORDER — GABAPENTIN 600 MG PO TABS: ORAL_TABLET | ORAL | 0 refills | Status: DC

## 2022-07-28 DIAGNOSIS — Z952 Presence of prosthetic heart valve: Secondary | ICD-10-CM | POA: Diagnosis not present

## 2022-07-28 DIAGNOSIS — I2782 Chronic pulmonary embolism: Secondary | ICD-10-CM | POA: Diagnosis not present

## 2022-07-28 DIAGNOSIS — M25562 Pain in left knee: Secondary | ICD-10-CM | POA: Diagnosis not present

## 2022-07-28 DIAGNOSIS — Z7901 Long term (current) use of anticoagulants: Secondary | ICD-10-CM | POA: Diagnosis not present

## 2022-07-29 ENCOUNTER — Telehealth: Payer: Self-pay | Admitting: *Deleted

## 2022-07-29 NOTE — Telephone Encounter (Signed)
Received fax from [facility] with INR results from 11/28/21 to 07/28/22. Last INR 1.9 on 07/28/22. Results placed in Dr. Gladstone Pih office.

## 2022-07-29 NOTE — Telephone Encounter (Signed)
POC Vendor faxed results of last INR uploaded by patient = 1.9 (target range 2.5 - 3.5) on 37.'5mg'$  warfarin/wk. INCREASING dose to 42.'5mg'$  warfarin/wk as: 1&1/2 x'5mg'$  (7.'5mg'$  dose) MWF; 1x'5mg'$  all other days. REPEAT PST FS POC INR on 19FEB24.

## 2022-07-30 ENCOUNTER — Encounter (HOSPITAL_COMMUNITY): Payer: Self-pay | Admitting: *Deleted

## 2022-08-02 ENCOUNTER — Encounter (HOSPITAL_COMMUNITY): Payer: Self-pay

## 2022-08-02 ENCOUNTER — Ambulatory Visit (HOSPITAL_COMMUNITY)
Admission: EM | Admit: 2022-08-02 | Discharge: 2022-08-02 | Disposition: A | Discharge status: Home / Self Care | Payer: 59 | Attending: Internal Medicine | Admitting: Internal Medicine

## 2022-08-02 ENCOUNTER — Ambulatory Visit (INDEPENDENT_AMBULATORY_CARE_PROVIDER_SITE_OTHER): Payer: 59

## 2022-08-02 DIAGNOSIS — M25532 Pain in left wrist: Secondary | ICD-10-CM

## 2022-08-02 MED ORDER — PREDNISONE 10 MG PO TABS: 20.0000 mg | ORAL_TABLET | Freq: Every day | ORAL | 0 refills | Status: AC

## 2022-08-02 NOTE — ED Triage Notes (Signed)
Pt is here for left wrist pain x yesterday

## 2022-08-02 NOTE — ED Provider Notes (Signed)
[facility]    CSN: [id number] Arrival date & time: 08/02/22  1125      History   Chief Complaint Chief Complaint  Patient presents with   Wrist Pain    HPI Kim Anthony is a 72 y.o. female.   History of Present Illness  Kim Anthony is a 72 y.o. female that complains of pain in the left wrist pain. Onset of symptoms was abrupt starting 1 day ago. Patient denies any injury. Patient reports that she has a history of arthritis and has "flare ups" of pain in her joints but has never had pain like this before. Patient describes pain as throbbing. Pain severity now is 10 /10. The pain does not radiate. Pain is aggravated by movement, use, and palpation. Pain is alleviated by nothing. She has tried wrist splint, gabapentin, vicodin and motrin without relief in her symptoms. She denies any numbness, tingling, weakness, loss of sensation or loss of motion.       Past Medical History:  Diagnosis Date   Arthritis    Asthma    Breast cancer ([place]) 2001   Left Breast Cancer   CHF (congestive heart failure) ([place])    COPD (chronic obstructive pulmonary disease) ([place])    Coronary artery disease    GERD (gastroesophageal reflux disease)    History of mitral valve replacement with mechanical valve    HLD (hyperlipidemia)    Hypertension    Pulmonary embolism ([place]) 2021    Patient Active Problem List   Diagnosis Date Noted   Endometrial thickening on ultrasound 07/07/2022   Gastroesophageal reflux disease without esophagitis 06/29/2022   Chronic pain of both knees 06/29/2022   Vaginal bleeding 04/01/2022   A-fib ([place]) 03/13/2022   Facial edema 03/07/2022   Atypical atrial flutter (HCC)    Combined systolic and diastolic heart failure ([place]) 02/19/2022   CHF (congestive heart failure) ([place]) 02/19/2022   Combined systolic and diastolic congestive heart failure ([place]) 02/18/2022   Acute pulmonary edema (HCC)    Edema of both lower extremities 01/13/2022   Hypotension  01/13/2022   Subacute cough 11/28/2021   COPD exacerbation ([place]) 11/05/2021   Conjunctival hemorrhage of left eye 06/25/2021   Osteoarthritis of left knee 04/24/2021   Seronegative inflammatory arthritis 02/17/2021   High risk medication use 02/17/2021   Elevated transaminase level 01/27/2021   Acute idiopathic gout of multiple sites 09/30/2020   Chronic pain syndrome 01/17/2020   Monoarthritis of wrist 10/18/2019   Ganglion cyst 09/21/2019   Hip pain 08/23/2019   Thoracic ascending aortic aneurysm ([place]) 07/05/2019   Anemia 02/16/2019   Breast cancer ([place])    Acquired absence of left breast 12/20/2018   COPD (chronic obstructive pulmonary disease) ([place]) 11/25/2017   S/P MVR (mitral valve replacement)    Chronic anticoagulation    Tobacco use disorder 02/25/2017   Lymphadenopathy, mediastinal 02/25/2017   Essential hypertension 02/01/2016   Allergic rhinitis 02/01/2016   Healthcare maintenance 01/30/2016   Chronic low back pain 07/22/2015   Pulmonary embolism ([place]) 07/11/2015   Chronic combined systolic and diastolic heart failure ([place]) 07/11/2015   Headache, common migraine 07/11/2015   HLD (hyperlipidemia) 07/11/2015    Past Surgical History:  Procedure Laterality Date   APPLICATION OF A-CELL OF CHEST/ABDOMEN Left 01/27/2019   Procedure: APPLICATION OF A-CELL OF CHEST;  Surgeon: Wallace Going, DO;  Location: [facility];  Service: Plastics;  Laterality: Left;   APPLICATION OF WOUND VAC N/A 01/27/2019   Procedure: APPLICATION OF  WOUND VAC;  Surgeon: Wallace Going, DO;  Location: [facility];  Service: Plastics;  Laterality: N/A;   BREAST SURGERY Left 2001   for CA   CARDIOVERSION N/A 02/26/2022   Procedure: CARDIOVERSION;  Surgeon: Donato Heinz, MD;  Location: [facility];  Service: Cardiovascular;  Laterality: N/A;   COLONOSCOPY WITH PROPOFOL N/A 06/07/2018   Procedure: COLONOSCOPY WITH PROPOFOL;  Surgeon: Wilford Corner, MD;  [facility];   Service: Endoscopy;  Laterality: N/A;   DEBRIDEMENT AND CLOSURE WOUND Left 12/28/2018   Procedure: Debridement And Closure Wound;  Surgeon: Coralie Keens, MD;  Location[place] [facility] [facility];  Service: General;  Laterality: Left;   INCISION AND DRAINAGE OF WOUND Left 01/27/2019   Procedure: IRRIGATION AND DEBRIDEMENT OF CHEST WALL;  Surgeon: Wallace Going, DO;  Location: [facility];  Service: Plastics;  Laterality: Left;   KNEE ARTHROSCOPY WITH MEDIAL MENISECTOMY Left 08/06/2021   Procedure: KNEE ARTHROSCOPY WITH PARTIAL MEDIAL MENISECTOMY, DEBRIDEMENT;  Surgeon: Susa Day, MD;  [facility];  Service: Orthopedics;  Laterality: Left;  1 HR   LATISSIMUS FLAP TO BREAST Left 01/18/2019   Procedure: LEFT LATISSIMUS FLAP TO BREAST;  Surgeon: Wallace Going, DO;  Location: [facility];  Service: Plastics;  Laterality: Left;   MASTECTOMY Left    MITRAL VALVE REPLACEMENT     mechanical   POLYPECTOMY  06/07/2018   Procedure: POLYPECTOMY;  Surgeon: Wilford Corner, MD;  [facility];  Service: Endoscopy;;   RIGHT/LEFT HEART CATH AND CORONARY ANGIOGRAPHY N/A 01/09/2021   Procedure: RIGHT/LEFT HEART CATH AND CORONARY ANGIOGRAPHY;  Surgeon: Jolaine Artist, MD;  Location: [facility];  Service: Cardiovascular;  Laterality: N/A;   WOUND DEBRIDEMENT Left 12/28/2018   Procedure: EXCISION OF LEFT CHRONIC CHEST WALL WOUND;  Surgeon: Coralie Keens, MD;  Location: [facility];  Service: General;  Laterality: Left;    OB History     Gravida  2   Para  2   Term  2   Preterm      AB      Living         SAB      IAB      Ectopic      Multiple      Live Births  2            Home Medications    Prior to Admission medications   Medication Sig Start Date End Date Taking? Authorizing Provider  albuterol (VENTOLIN HFA) 108 (90 Base) MCG/ACT inhaler Inhale 2 puffs into the lungs every 6 (six) hours as needed for wheezing or shortness of breath. 07/02/22  Yes Rick Duff, MD  amiodarone (PACERONE) 200 MG tablet Take 1 tablet (200 mg total) by mouth daily. 03/13/22  Yes Mil[place], Maricela Bo, FNP  DULoxetine (CYMBALTA) 20 MG capsule Take 20 mg by mouth at bedtime.   Yes [provider]  famotidine (PEPCID) 20 MG tablet Take 1 tablet (20 mg total) by mouth 2 (two) times daily. 06/03/22  Yes Iona Coach, MD  FARXIGA 10 MG TABS tablet Take 1 tablet by mouth once daily 02/09/22  Yes Idamae Schuller, MD  fluticasone [facility]) 50 MCG/ACT nasal spray Place 1 spray into both nostrils daily as needed for allergies. 03/02/22 03/02/23 Yes Nani Gasser, MD  gabapentin (NEURONTIN) 600 MG tablet TAKE 1 & 1/2 (ONE & ONE-HALF) TABLETS BY MOUTH THREE TIMES DAILY 07/27/22  Yes Iona Coach, MD  HYDROcodone-acetaminophen [facility]) 5-325 MG tablet Take 1 tablet by  mouth every 4 (four) hours as needed for moderate pain. 06/29/22  Yes Lovorn, Jinny Blossom, MD  losartan (COZAAR) 25 MG tablet Take 0.5 tablets (12.5 mg total) by mouth daily. 04/10/22 01/05/23 Yes Milford, Maricela Bo, FNP  metoprolol succinate (TOPROL-XL) 25 MG 24 hr tablet Take 1 tablet (25 mg total) by mouth daily. 06/03/22  Yes Iona Coach, MD  pantoprazole (PROTONIX) 40 MG tablet Take 1 tablet (40 mg total) by mouth daily. 06/29/22  Yes Lovorn, Jinny Blossom, MD  potassium chloride SA (KLOR-CON M) 20 MEQ tablet Take 2 tablets (40 mEq total) by mouth 2 (two) times daily. 05/13/22  Yes [place], Maricela Bo, FNP  predniSONE (DELTASONE) 10 MG tablet Take 2 tablets (20 mg total) by mouth daily for 5 days. 08/02/22 08/07/22 Yes Enrique Sack, FNP  Simethicone (GAS-X PO) Take 1 tablet by mouth daily.   Yes [provider]  simvastatin (ZOCOR) 20 MG tablet Take 1 tablet (20 mg total) by mouth daily. 03/02/22  Yes Nani Gasser, MD  Spacer/Aero-Holding Chambers (BREATHERITE COLL SPACER ADULT) MISC 1 applicator by Does not apply route daily. 01/11/20  Yes [place], Carley D, DO  spironolactone (ALDACTONE) 25 MG tablet Take 1  tablet by mouth once daily 11/19/21  Yes Bensimhon, Shaune Pascal, MD  Tiotropium Bromide-Olodaterol (STIOLTO RESPIMAT) 2.5-2.5 MCG/ACT AERS Inhale 2 puffs by mouth daily 07/02/22  Yes Rick Duff, MD  torsemide (DEMADEX) 20 MG tablet Take 2 tablets (40 mg total) by mouth daily. 05/13/22  Yes Rafael Bihari, FNP  warfarin (COUMADIN) 5 MG tablet Give one (1) tablet on Sundays and Wednesdays. On all other days, give only one-half (1/2) tablet 07/02/22  Yes Rick Duff, MD  diphenhydrAMINE (BENADRYL) 25 MG tablet Take 75 mg by mouth daily as needed for allergies.    [provider]    Family History Family History  Problem Relation Age of Onset   Hypertension Mother    Cancer Father    Hypertension Father    Breast cancer Maternal Aunt 98   Heart attack Other     Social History Social History   Tobacco Use   Smoking status: Every Day    Packs/day: 0.10    Years: 30.00    Total pack years: 3.00    Types: Cigarettes   Smokeless tobacco: Never   Tobacco comments:    5 cigs per day  Vaping Use   Vaping Use: Never used  Substance Use Topics   Alcohol use: Yes    Alcohol/week: 3.0 standard drinks of alcohol    Types: 3 Standard drinks or equivalent per week    Comment: beer- ocasional   Drug use: No     Allergies   Lisinopril, Acetaminophen-codeine, Propoxyphene, Tape, and Tramadol   Review of Systems Review of Systems  Constitutional:  Negative for fever.  Musculoskeletal:  Positive for arthralgias. Negative for joint swelling.  All other systems reviewed and are negative.    Physical Exam Triage Vital Signs ED Triage Vitals  Enc Vitals Group     BP 08/02/22 1251 123/82     Pulse Rate 08/02/22 1251 91     Resp 08/02/22 1251 16     Temp 08/02/22 1251 98.5 F (36.9 C)     Temp Source 08/02/22 1251 Oral     SpO2 08/02/22 1251 93 %     Weight --      Height --      Head Circumference --      Peak Flow --  Pain Score 08/02/22 1248 10      Pain Loc --      Pain Edu? --      Excl. in [place]? --    No data found.  Updated Vital Signs BP 123/82 (BP Location: Right Arm)   Pulse 91   Temp 98.5 F (36.9 C) (Oral)   Resp 16   SpO2 93%   Visual Acuity Right Eye Distance:   Left Eye Distance:   Bilateral Distance:    Right Eye Near:   Left Eye Near:    Bilateral Near:     Physical Exam Vitals reviewed.  Constitutional:      General: She is not in acute distress.    Appearance: Normal appearance.  HENT:     Head: Normocephalic.  Cardiovascular:     Rate and Rhythm: Normal rate and regular rhythm.  Pulmonary:     Effort: Pulmonary effort is normal.  Musculoskeletal:        General: Normal range of motion.     Right wrist: Normal.     Left wrist: Swelling and tenderness present. No deformity, effusion or lacerations. Normal range of motion.       Hands:     Cervical back: Normal range of motion.     Comments: Right hand dominant   Skin:    General: Skin is warm and dry.  Neurological:     General: No focal deficit present.     Mental Status: She is alert and oriented to person, place, and time.      UC Treatments / Results  Labs (all labs ordered are listed, but only abnormal results are displayed) Labs Reviewed - No data to display  EKG   Radiology DG Wrist Complete Left  Result Date: 08/02/2022 CLINICAL DATA:  Left wrist pain EXAM: LEFT WRIST - COMPLETE 3+ VIEW COMPARISON:  None Available. FINDINGS: There is no evidence of fracture or dislocation. There is no evidence of significant arthropathy or other focal bone abnormality. Soft tissue swelling over the dorsum of the wrist. IMPRESSION: Soft tissue swelling without acute fracture or dislocation. Electronically Signed   By: Davina Poke D.O.   On: 08/02/2022 13:06    Procedures Procedures (including critical care time)  Medications Ordered in UC Medications - No data to display  Initial Impression / Assessment and Plan / UC Course  I have  reviewed the triage vital signs and the nursing notes.  Pertinent labs & imaging results that were available during my care of the patient were reviewed by me and considered in my medical decision making (see chart for details).     72 yo female presenting with acute pain in the left wrist. No injury. Has history of arthritis. On gabapentin and Vicodin chronically. XR of wrist shows soft tissue swelling but no evidence of fracture, dislocation, significant arthropathy or other focal bone abnormality. Patient advised to continue home therapies. Will place patient in more stable wrist splint for comfort as well as provide a short course of steroids to help reduce inflammation. Discussed RICE therapy. Follow-up with orthopedics if symptoms fail to improve or worsen.   Today's evaluation has revealed no signs of a dangerous process. Discussed diagnosis with patient and/or guardian. Patient and/or guardian aware of their diagnosis, possible red flag symptoms to watch out for and need for close follow up. Patient and/or guardian understands verbal and written discharge instructions. Patient and/or guardian comfortable with plan and disposition.  Patient and/or guardian has a clear mental  status at this time, good insight into illness (after discussion and teaching) and has clear judgment to make decisions regarding their care  Documentation was completed with the aid of voice recognition software. Transcription may contain typographical errors. Final Clinical Impressions(s) / UC Diagnoses   Final diagnoses:  Left wrist pain     Discharge Instructions      The XR of your wrist shows swelling but no fracture (break) or any other abnormality.   Take medications as prescribed.  Continue your gabapentin and Vicodin as well  Wear the splint for comfort and to help reduce swelling. Do not get the splint wet. You may remove it for bathing.  Loosen the splint or wrap if your fingers tingle, become numb,  or turn cold and blue. Apply ice on the painful area. Place a towel between your skin and the bag or between your splint or wrap and the bag. Leave the ice on for 20 minutes, 2-3 times a day. Move your fingers often to reduce stiffness and swelling. Raise (elevate) the injured area above the level of your heart while you are sitting or lying down.  Go to the ED if:  You lose feeling in your fingers or hand. Your fingers turn white, very red, or cold and blue. You cannot move your fingers.      ED Prescriptions     Medication Sig Dispense Auth. Provider   predniSONE (DELTASONE) 10 MG tablet Take 2 tablets (20 mg total) by mouth daily for 5 days. 10 tablet Enrique Sack, FNP      PDMP not reviewed this encounter.   Enrique Sack, [place] 08/02/22 1442

## 2022-08-02 NOTE — Discharge Instructions (Addendum)
The XR of your wrist shows swelling but no fracture (break) or any other abnormality.   Take medications as prescribed.  Continue your gabapentin and Vicodin as well  Wear the splint for comfort and to help reduce swelling. Do not get the splint wet. You may remove it for bathing.  Loosen the splint or wrap if your fingers tingle, become numb, or turn cold and blue. Apply ice on the painful area. Place a towel between your skin and the bag or between your splint or wrap and the bag. Leave the ice on for 20 minutes, 2-3 times a day. Move your fingers often to reduce stiffness and swelling. Raise (elevate) the injured area above the level of your heart while you are sitting or lying down.  Go to the ED if:  You lose feeling in your fingers or hand. Your fingers turn white, very red, or cold and blue. You cannot move your fingers.

## 2022-08-04 ENCOUNTER — Ambulatory Visit: Payer: Self-pay

## 2022-08-04 DIAGNOSIS — M25532 Pain in left wrist: Secondary | ICD-10-CM | POA: Diagnosis not present

## 2022-08-04 DIAGNOSIS — M25531 Pain in right wrist: Secondary | ICD-10-CM | POA: Diagnosis not present

## 2022-08-04 NOTE — Patient Instructions (Signed)
Visit Information  Thank you for taking time to visit with me today. Please don't hesitate to contact me if I can be of assistance to you.   Following are the goals we discussed today:   Goals Addressed             This Visit's Progress    Monitor and Manage my CHF       Patient Goals/Self Care Activities: -Patient/Caregiver will self-administer medications as prescribed as evidenced by self-report/primary caregiver report  -Patient/Caregiver will attend all scheduled provider appointments as evidenced by clinician review of documented attendance to scheduled appointments and patient/caregiver report -Patient/Caregiver will call pharmacy for medication refills as evidenced by patient report and review of pharmacy fill history as appropriate -Patient/Caregiver will call provider office for new concerns or questions as evidenced by review of documented incoming telephone call notes and patient report -Patient/Caregiver verbalizes understanding of plan  -Weigh daily and record (notify MD with 3 lb weight gain over night or 5 lb in a week) -Follow CHF Action Plan -Adhere to low sodium diet          Our next appointment is by telephone on 09/03/22 at 10 am  Please call the care guide team at [phone] if you need to cancel or reschedule your appointment.   If you are experiencing a Mental Health or [place] or need someone to talk to, please call [phone]-TALK (toll free, 24 hour hotline)  Patient verbalizes understanding of instructions and care plan provided today and agrees to view in [place]. Active MyChart status and patient understanding of how to access instructions and care plan via MyChart confirmed with patient.     Lazaro Arms RN, BSN, [facility]   Phone: [phone]

## 2022-08-04 NOTE — Patient Outreach (Signed)
  Care Coordination   Follow Up Visit Note   08/04/2022 Name: Kim Anthony [name]: [id number] DOB: [birth year]  Kim Anthony is a 72 y.o. year old female who sees Iona Coach, MD for primary care. I spoke with  Shaune Pascal by phone today.  What matters to the patients health and wellness today?  Kim Anthony is experiencing pain in both of her wrists, which has been ongoing. She has an appointment scheduled today at 2 pm with a hand specialist. According to her, she had a similar condition before, a cyst, which resolved on its own, but this time it's getting worse. The pain she's currently experiencing is at a 9/10 level and is spreading up her arm. Although she's ready to have something done about it, she's concerned that it might affect her knee replacement surgery in April. She denies any chest pain, shortness of breath, or swelling.    Goals Addressed             This Visit's Progress    Monitor and Manage my CHF       Patient Goals/Self Care Activities: -Patient/Caregiver will self-administer medications as prescribed as evidenced by self-report/primary caregiver report  -Patient/Caregiver will attend all scheduled provider appointments as evidenced by clinician review of documented attendance to scheduled appointments and patient/caregiver report -Patient/Caregiver will call pharmacy for medication refills as evidenced by patient report and review of pharmacy fill history as appropriate -Patient/Caregiver will call provider office for new concerns or questions as evidenced by review of documented incoming telephone call notes and patient report -Patient/Caregiver verbalizes understanding of plan  -Weigh daily and record (notify MD with 3 lb weight gain over night or 5 lb in a week) -Follow CHF Action Plan -Adhere to low sodium diet          SDOH assessments and interventions completed:  No     Care Coordination Interventions:  Yes, provided   Interventions Today     Flowsheet Row Most Recent Value  Chronic Disease   Chronic disease during today's visit Congestive Heart Failure (CHF)  General Interventions   General Interventions Discussed/Reviewed General Interventions Discussed        Follow up plan: Follow up call scheduled for 09/03/22  10 am    Encounter Outcome:  Pt. Visit Completed   Lazaro Arms RN, BSN, [facility]   Phone: [phone]

## 2022-08-06 ENCOUNTER — Other Ambulatory Visit: Payer: Self-pay

## 2022-08-06 MED ORDER — HYDROCODONE-ACETAMINOPHEN 5-325 MG PO TABS: 1.0000 | ORAL_TABLET | ORAL | 0 refills | Status: DC | PRN

## 2022-08-19 ENCOUNTER — Ambulatory Visit (INDEPENDENT_AMBULATORY_CARE_PROVIDER_SITE_OTHER): Payer: 59 | Admitting: Student

## 2022-08-19 DIAGNOSIS — U071 COVID-19: Secondary | ICD-10-CM | POA: Insufficient documentation

## 2022-08-19 DIAGNOSIS — K219 Gastro-esophageal reflux disease without esophagitis: Secondary | ICD-10-CM

## 2022-08-19 MED ORDER — PANTOPRAZOLE SODIUM 40 MG PO TBEC: 40.0000 mg | DELAYED_RELEASE_TABLET | Freq: Every day | ORAL | 0 refills | Status: DC

## 2022-08-19 NOTE — Assessment & Plan Note (Signed)
Assessment: Has had episodes of acid reflux for the past few months. Was started on protonix by Dr. Dagoberto Ligas which has helped her symptoms.   Plan: - continue protonix 40 mg daily

## 2022-08-19 NOTE — Patient Instructions (Signed)
Thank you for allowing [place] to care for you today. Your symptoms are improving so please continue to stay hydrated and take over the counter medicines as needed. Please make sure these cold medicines do not include pseudoephrine or phenlephrine. If you have questions about which medications you can take with your history of abnormal heart rhythm and heart disease, please call our clinic or ask the pharmacist.   Please call us to be seen in person if your symptoms worsen or you begin having shortness of breath.

## 2022-08-19 NOTE — Progress Notes (Signed)
   CC: Home COVID + test  This is a telephone encounter between Kim Anthony and [name] on 08/19/2022 for symptoms of nasal congestion, dry cough, and decreased appetite for the last 7 days and a COVID positive test yesterday. The visit was conducted with the patient located at home and [name] at [facility]. The patient's identity was confirmed using their DOB and current address. The patient has consented to being evaluated through a telephone encounter and understands the associated risks (an examination cannot be done and the patient may need to come in for an appointment) / benefits (allows the patient to remain at home, decreasing exposure to coronavirus). I personally spent 11 minutes on medical discussion.   HPI:  [name] is a 72 y.o. with PMH as below.   Please see A&P for assessment of the patient's acute and chronic medical conditions.   Past Medical History:  Diagnosis Date   Arthritis    Asthma    Breast cancer ([place]) 2001   Left Breast Cancer   CHF (congestive heart failure) (HCC)    COPD (chronic obstructive pulmonary disease) (HCC)    Coronary artery disease    GERD (gastroesophageal reflux disease)    History of mitral valve replacement with mechanical valve    HLD (hyperlipidemia)    Hypertension    Pulmonary embolism ([place]) 2021   Review of Systems:   + decreased appetite, congestion, chills, dry cough,and diarrhea - shortness of breath, fevers, productive cough    Assessment & Plan:   COVID-19 virus infection Assessment: [name] endorses symptoms of congestion, decreased appetite, chills, a dry cough and one episode of diarrhea for the past week. She tested herself at home for COVID 5 days ago and was negative, she rested yesterday and was positive. Her symptoms are improving, she is able to tolerate liquids and a PO diet. She denies worsening in her COPD, she is not short of breath or requiring increasing use of her prn albuterol.    She has been vaccinated for COVID x 3. While she is high risk with it being 7 days from onset of symptoms and her feeling better I do not believe she needs any anti-viral therapies or prednisone for a COPD exacerbation. She was given strict return precautions if symptoms do not improve, she is unable to keep fluids down, or her breathing worsens.   Encouraged her to avoid anything with phenylephrine/pseudephrine with her cardiac history  Plan: - continue conservative management, increase fluid intake while ill and continue mucinex at home - return precautions given  Gastroesophageal reflux disease without esophagitis Assessment: Has had episodes of acid reflux for the past few months. Was started on protonix by Dr. Dagoberto Ligas which has helped her symptoms.   Plan: - continue protonix 40 mg daily   Patient discussed with Dr. Donnita Falls Internal Medicine Resident

## 2022-08-19 NOTE — Assessment & Plan Note (Signed)
Assessment: Kim Anthony endorses symptoms of congestion, decreased appetite, chills, a dry cough and one episode of diarrhea for the past week. She tested herself at home for COVID 5 days ago and was negative, she rested yesterday and was positive. Her symptoms are improving, she is able to tolerate liquids and a PO diet. She denies worsening in her COPD, she is not short of breath or requiring increasing use of her prn albuterol.   She has been vaccinated for COVID x 3. While she is high risk with it being 7 days from onset of symptoms and her feeling better I do not believe she needs any anti-viral therapies or prednisone for a COPD exacerbation. She was given strict return precautions if symptoms do not improve, she is unable to keep fluids down, or her breathing worsens.   Encouraged her to avoid anything with phenylephrine/pseudephrine with her cardiac history  Plan: - continue conservative management, increase fluid intake while ill and continue mucinex at home - return precautions given

## 2022-08-27 ENCOUNTER — Other Ambulatory Visit: Payer: Self-pay

## 2022-08-27 ENCOUNTER — Emergency Department (HOSPITAL_COMMUNITY): Payer: 59

## 2022-08-27 ENCOUNTER — Observation Stay (HOSPITAL_COMMUNITY)
Admission: EM | Admit: 2022-08-27 | Discharge: 2022-08-29 | Disposition: A | Discharge status: Home / Self Care | Payer: 59 | Attending: Internal Medicine | Admitting: Internal Medicine

## 2022-08-27 ENCOUNTER — Encounter (HOSPITAL_COMMUNITY): Payer: Self-pay | Admitting: Internal Medicine

## 2022-08-27 DIAGNOSIS — I509 Heart failure, unspecified: Secondary | ICD-10-CM | POA: Insufficient documentation

## 2022-08-27 DIAGNOSIS — Z952 Presence of prosthetic heart valve: Secondary | ICD-10-CM | POA: Insufficient documentation

## 2022-08-27 DIAGNOSIS — Z853 Personal history of malignant neoplasm of breast: Secondary | ICD-10-CM | POA: Diagnosis not present

## 2022-08-27 DIAGNOSIS — I2510 Atherosclerotic heart disease of native coronary artery without angina pectoris: Secondary | ICD-10-CM | POA: Insufficient documentation

## 2022-08-27 DIAGNOSIS — F17210 Nicotine dependence, cigarettes, uncomplicated: Secondary | ICD-10-CM | POA: Diagnosis not present

## 2022-08-27 DIAGNOSIS — I480 Paroxysmal atrial fibrillation: Secondary | ICD-10-CM | POA: Diagnosis not present

## 2022-08-27 DIAGNOSIS — I110 Hypertensive heart disease with heart failure: Secondary | ICD-10-CM | POA: Insufficient documentation

## 2022-08-27 DIAGNOSIS — Z79899 Other long term (current) drug therapy: Secondary | ICD-10-CM | POA: Diagnosis not present

## 2022-08-27 DIAGNOSIS — U071 COVID-19: Secondary | ICD-10-CM | POA: Insufficient documentation

## 2022-08-27 DIAGNOSIS — Z86711 Personal history of pulmonary embolism: Secondary | ICD-10-CM | POA: Diagnosis not present

## 2022-08-27 DIAGNOSIS — R0602 Shortness of breath: Secondary | ICD-10-CM | POA: Diagnosis present

## 2022-08-27 DIAGNOSIS — R791 Abnormal coagulation profile: Secondary | ICD-10-CM | POA: Diagnosis not present

## 2022-08-27 DIAGNOSIS — J9601 Acute respiratory failure with hypoxia: Secondary | ICD-10-CM | POA: Diagnosis not present

## 2022-08-27 DIAGNOSIS — Z86718 Personal history of other venous thrombosis and embolism: Secondary | ICD-10-CM | POA: Diagnosis not present

## 2022-08-27 DIAGNOSIS — J45909 Unspecified asthma, uncomplicated: Secondary | ICD-10-CM | POA: Insufficient documentation

## 2022-08-27 DIAGNOSIS — J441 Chronic obstructive pulmonary disease with (acute) exacerbation: Secondary | ICD-10-CM | POA: Diagnosis not present

## 2022-08-27 DIAGNOSIS — Z7901 Long term (current) use of anticoagulants: Secondary | ICD-10-CM | POA: Diagnosis not present

## 2022-08-27 LAB — RESP PANEL BY RT-PCR (RSV, FLU A&B, COVID)  RVPGX2
Influenza A by PCR: NEGATIVE
Influenza B by PCR: NEGATIVE
Resp Syncytial Virus by PCR: NEGATIVE
SARS Coronavirus 2 by RT PCR: POSITIVE — AB

## 2022-08-27 LAB — TROPONIN I (HIGH SENSITIVITY)
Troponin I (High Sensitivity): 10 ng/L (ref <18)
Troponin I (High Sensitivity): 13 ng/L (ref <18)

## 2022-08-27 LAB — COMPREHENSIVE METABOLIC PANEL
ALT: 18 U/L (ref 0–44)
AST: 38 U/L (ref 15–41)
Albumin: 3.2 g/dL — ABNORMAL LOW (ref 3.5–5.0)
Alkaline Phosphatase: 149 U/L — ABNORMAL HIGH (ref 38–126)
Anion gap: 13 (ref 5–15)
BUN: 15 mg/dL (ref 8–23)
CO2: 22 mmol/L (ref 22–32)
Calcium: 8.6 mg/dL — ABNORMAL LOW (ref 8.9–10.3)
Chloride: 102 mmol/L (ref 98–111)
Creatinine, Ser: 1.07 mg/dL — ABNORMAL HIGH (ref 0.44–1.00)
GFR, Estimated: 55 mL/min — ABNORMAL LOW (ref >60)
Glucose, Bld: 99 mg/dL (ref 70–99)
Potassium: 3.3 mmol/L — ABNORMAL LOW (ref 3.5–5.1)
Sodium: 137 mmol/L (ref 135–145)
Total Bilirubin: 0.7 mg/dL (ref 0.3–1.2)
Total Protein: 7.6 g/dL (ref 6.5–8.1)

## 2022-08-27 LAB — CBC
HCT: 40.4 % (ref 36.0–46.0)
Hemoglobin: 13.1 g/dL (ref 12.0–15.0)
MCH: 33.3 pg (ref 26.0–34.0)
MCHC: 32.4 g/dL (ref 30.0–36.0)
MCV: 102.8 fL — ABNORMAL HIGH (ref 80.0–100.0)
Platelets: 495 10*3/uL — ABNORMAL HIGH (ref 150–400)
RBC: 3.93 MIL/uL (ref 3.87–5.11)
RDW: 12.7 % (ref 11.5–15.5)
WBC: 8.7 10*3/uL (ref 4.0–10.5)
nRBC: 0 % (ref 0.0–0.2)

## 2022-08-27 LAB — PROTIME-INR
INR: 4.4 (ref 0.8–1.2)
Prothrombin Time: 41.7 seconds — ABNORMAL HIGH (ref 11.4–15.2)

## 2022-08-27 LAB — MAGNESIUM: Magnesium: 1.9 mg/dL (ref 1.7–2.4)

## 2022-08-27 LAB — BRAIN NATRIURETIC PEPTIDE: B Natriuretic Peptide: 154.9 pg/mL — ABNORMAL HIGH (ref 0.0–100.0)

## 2022-08-27 MED ORDER — IPRATROPIUM-ALBUTEROL 0.5-2.5 (3) MG/3ML IN SOLN
3.0000 mL | Freq: Once | RESPIRATORY_TRACT | Status: AC
  Administered 2022-08-27: 3 mL via RESPIRATORY_TRACT
  Filled 2022-08-27: qty 3

## 2022-08-27 MED ORDER — DULOXETINE HCL 20 MG PO CPEP
20.0000 mg | ORAL_CAPSULE | Freq: Every day | ORAL | Status: DC
  Administered 2022-08-28 – 2022-08-29 (×2): 20 mg via ORAL
  Filled 2022-08-27 (×2): qty 1

## 2022-08-27 MED ORDER — HYDROCODONE-ACETAMINOPHEN 5-325 MG PO TABS
1.0000 | ORAL_TABLET | ORAL | Status: DC | PRN
  Administered 2022-08-27 – 2022-08-29 (×11): 1 via ORAL
  Filled 2022-08-27 (×11): qty 1

## 2022-08-27 MED ORDER — IPRATROPIUM-ALBUTEROL 0.5-2.5 (3) MG/3ML IN SOLN
3.0000 mL | RESPIRATORY_TRACT | Status: DC
  Administered 2022-08-27 – 2022-08-28 (×4): 3 mL via RESPIRATORY_TRACT
  Filled 2022-08-27 (×4): qty 3

## 2022-08-27 MED ORDER — FLUTICASONE PROPIONATE 50 MCG/ACT NA SUSP
1.0000 | Freq: Every day | NASAL | Status: DC | PRN
  Administered 2022-08-27 – 2022-08-28 (×2): 1 via NASAL
  Filled 2022-08-27 (×2): qty 16

## 2022-08-27 MED ORDER — PANTOPRAZOLE SODIUM 40 MG PO TBEC
40.0000 mg | DELAYED_RELEASE_TABLET | Freq: Every day | ORAL | Status: DC
  Administered 2022-08-27 – 2022-08-29 (×3): 40 mg via ORAL
  Filled 2022-08-27 (×3): qty 1

## 2022-08-27 MED ORDER — POTASSIUM CHLORIDE CRYS ER 20 MEQ PO TBCR
40.0000 meq | EXTENDED_RELEASE_TABLET | Freq: Two times a day (BID) | ORAL | Status: DC
  Administered 2022-08-27: 40 meq via ORAL
  Filled 2022-08-27: qty 2

## 2022-08-27 MED ORDER — TIOTROPIUM BROMIDE MONOHYDRATE 18 MCG IN CAPS: 18.0000 ug | ORAL_CAPSULE | Freq: Every day | RESPIRATORY_TRACT | Status: DC

## 2022-08-27 MED ORDER — GABAPENTIN 300 MG PO CAPS: 900.0000 mg | ORAL_CAPSULE | Freq: Three times a day (TID) | ORAL | Status: DC

## 2022-08-27 MED ORDER — PREDNISONE 10 MG PO TABS
40.0000 mg | ORAL_TABLET | Freq: Every day | ORAL | Status: DC
  Administered 2022-08-28 – 2022-08-29 (×2): 40 mg via ORAL
  Filled 2022-08-27 (×2): qty 4

## 2022-08-27 MED ORDER — METOPROLOL SUCCINATE ER 25 MG PO TB24
25.0000 mg | ORAL_TABLET | Freq: Every day | ORAL | Status: DC
  Administered 2022-08-27 – 2022-08-29 (×3): 25 mg via ORAL
  Filled 2022-08-27 (×3): qty 1

## 2022-08-27 MED ORDER — SIMVASTATIN 20 MG PO TABS
20.0000 mg | ORAL_TABLET | Freq: Every day | ORAL | Status: DC
  Administered 2022-08-27 – 2022-08-29 (×3): 20 mg via ORAL
  Filled 2022-08-27 (×3): qty 1

## 2022-08-27 MED ORDER — POTASSIUM CHLORIDE CRYS ER 20 MEQ PO TBCR: 20.0000 meq | EXTENDED_RELEASE_TABLET | Freq: Two times a day (BID) | ORAL | Status: DC

## 2022-08-27 MED ORDER — TORSEMIDE 20 MG PO TABS
40.0000 mg | ORAL_TABLET | Freq: Every day | ORAL | Status: DC
  Administered 2022-08-27 – 2022-08-29 (×3): 40 mg via ORAL
  Filled 2022-08-27 (×3): qty 2

## 2022-08-27 MED ORDER — METHYLPREDNISOLONE SODIUM SUCC 125 MG IJ SOLR
125.0000 mg | Freq: Once | INTRAMUSCULAR | Status: AC
  Administered 2022-08-27: 125 mg via INTRAVENOUS
  Filled 2022-08-27: qty 2

## 2022-08-27 MED ORDER — ALBUTEROL SULFATE (2.5 MG/3ML) 0.083% IN NEBU
5.0000 mg | INHALATION_SOLUTION | Freq: Once | RESPIRATORY_TRACT | Status: AC
  Administered 2022-08-27: 5 mg via RESPIRATORY_TRACT
  Filled 2022-08-27: qty 6

## 2022-08-27 MED ORDER — WARFARIN - PHARMACIST DOSING INPATIENT: Freq: Every day | Status: DC

## 2022-08-27 MED ORDER — ALBUTEROL SULFATE (2.5 MG/3ML) 0.083% IN NEBU
3.0000 mL | INHALATION_SOLUTION | Freq: Four times a day (QID) | RESPIRATORY_TRACT | Status: DC | PRN
  Administered 2022-08-28: 3 mL via RESPIRATORY_TRACT
  Filled 2022-08-27: qty 3

## 2022-08-27 MED ORDER — ARFORMOTEROL TARTRATE 15 MCG/2ML IN NEBU
15.0000 ug | INHALATION_SOLUTION | Freq: Two times a day (BID) | RESPIRATORY_TRACT | Status: DC
  Administered 2022-08-27: 15 ug via RESPIRATORY_TRACT
  Filled 2022-08-27: qty 2

## 2022-08-27 MED ORDER — UMECLIDINIUM BROMIDE 62.5 MCG/ACT IN AEPB
1.0000 | INHALATION_SPRAY | Freq: Every day | RESPIRATORY_TRACT | Status: DC
  Administered 2022-08-27 – 2022-08-29 (×2): 1 via RESPIRATORY_TRACT
  Filled 2022-08-27: qty 7

## 2022-08-27 MED ORDER — DAPAGLIFLOZIN PROPANEDIOL 10 MG PO TABS
10.0000 mg | ORAL_TABLET | Freq: Every day | ORAL | Status: DC
  Administered 2022-08-27 – 2022-08-29 (×3): 10 mg via ORAL
  Filled 2022-08-27 (×3): qty 1

## 2022-08-27 MED ORDER — GUAIFENESIN-DM 100-10 MG/5ML PO SYRP
5.0000 mL | ORAL_SOLUTION | ORAL | Status: DC | PRN
  Administered 2022-08-27 – 2022-08-29 (×5): 5 mL via ORAL
  Filled 2022-08-27 (×6): qty 5

## 2022-08-27 MED ORDER — SPIRONOLACTONE 25 MG PO TABS
25.0000 mg | ORAL_TABLET | Freq: Every day | ORAL | Status: DC
  Administered 2022-08-28 – 2022-08-29 (×2): 25 mg via ORAL
  Filled 2022-08-27 (×2): qty 1

## 2022-08-27 NOTE — ED Triage Notes (Signed)
Patient arrived with EMS from home reports worsening SOB this evening with chest congestion /productive cough unrelieved by her rescue inhaler . History of CHF/COPD and Asthma .

## 2022-08-27 NOTE — ED Provider Notes (Signed)
  Physical Exam  BP (!) 131/93 (BP Location: Left Arm)   Pulse 83   Temp 97.9 F (36.6 C) (Oral)   Resp (!) 24   SpO2 97%   Physical Exam  Procedures  Procedures  ED Course / MDM    Medical Decision Making Amount and/or Complexity of Data Reviewed Labs: ordered. Radiology: ordered. ECG/medicine tests: ordered.  Risk Prescription drug management. Decision regarding hospitalization.   Patient is shortness of breath.  Cough.  Recently diagnosed with COVID a week ago.  More short of breath today.  Has had breathing treatments.  X-ray stable from prior.  However has had breathing treatments and steroids and continues to have episodes of hypoxia.  Down to the 67s.  With continued recurrent hypoxia will discuss with internal medicine for admission. Doubt pulmonary embolism.  Has an INR of 4.4.       Davonna Belling, MD 08/27/22 1048

## 2022-08-27 NOTE — ED Notes (Signed)
..ED TO INPATIENT HANDOFF REPORT  ED Nurse Name and Phone #: [phone]  S Name/Age/Gender Kim Anthony 72 y.o. female Room/Bed: 044C/044C  Code Status   Code Status: Full Code  Home/SNF/Other Home Patient oriented to: self, place, time, and situation Is this baseline? Yes   Triage Complete: Triage complete  Chief Complaint COPD exacerbation [facility]) [J44.1]  Triage Note Patient arrived with EMS from home reports worsening SOB this evening with chest congestion /productive cough unrelieved by her rescue inhaler . History of CHF/COPD and Asthma .    Allergies Allergies  Allergen Reactions   Lisinopril Swelling   Acetaminophen-Codeine Itching   Propoxyphene Itching    Darvocet   Tape Rash    Plastic   Tramadol Itching, Nausea And Vomiting and Rash    Level of Care/Admitting Diagnosis ED Disposition     ED Disposition  Admit   Condition  --   Comment  Hospital Area: [place] [id number]  Level of Care: Med-Surg [16]  May place patient in observation at [facility] or [facility] if equivalent level of care is available:: No  Covid Evaluation: Asymptomatic - no recent exposure (last 10 days) testing not required  Diagnosis: COPD exacerbation [facility]) [id number]  Admitting Physician: Charise Killian [id number]  Attending Physician: Charise Killian [id number]          B Medical/Surgery History Past Medical History:  Diagnosis Date   Arthritis    Asthma    Breast cancer ([place]) 2001   Left Breast Cancer   CHF (congestive heart failure) ([place])    COPD (chronic obstructive pulmonary disease) ([place])    Coronary artery disease    GERD (gastroesophageal reflux disease)    History of mitral valve replacement with mechanical valve    HLD (hyperlipidemia)    Hypertension    Pulmonary embolism ([place]) 2021   Past Surgical History:  Procedure Laterality Date   APPLICATION OF A-CELL OF CHEST/ABDOMEN Left 01/27/2019   Procedure: APPLICATION OF A-CELL OF CHEST;  Surgeon:  Wallace Going, DO;  Location: [place];  Service: Plastics;  Laterality: Left;   APPLICATION OF WOUND VAC N/A 01/27/2019   Procedure: APPLICATION OF WOUND VAC;  Surgeon: Wallace Going, DO;  Location: [place];  Service: Plastics;  Laterality: N/A;   BREAST SURGERY Left 2001   for CA   CARDIOVERSION N/A 02/26/2022   Procedure: CARDIOVERSION;  Surgeon: Donato Heinz, MD;  Location: [place];  Service: Cardiovascular;  Laterality: N/A;   COLONOSCOPY WITH PROPOFOL N/A 06/07/2018   Procedure: COLONOSCOPY WITH PROPOFOL;  Surgeon: Wilford Corner, MD;  [facility];  Service: Endoscopy;  Laterality: N/A;   DEBRIDEMENT AND CLOSURE WOUND Left 12/28/2018   Procedure: Debridement And Closure Wound;  Surgeon: Coralie Keens, MD;  Location: [place];  Service: General;  Laterality: Left;   INCISION AND DRAINAGE OF WOUND Left 01/27/2019   Procedure: IRRIGATION AND DEBRIDEMENT OF CHEST WALL;  Surgeon: Wallace Going, DO;  Location: [place];  Service: Plastics;  Laterality: Left;   KNEE ARTHROSCOPY WITH MEDIAL MENISECTOMY Left 08/06/2021   Procedure: KNEE ARTHROSCOPY WITH PARTIAL MEDIAL MENISECTOMY, DEBRIDEMENT;  Surgeon: Susa Day, MD;  [facility];  Service: Orthopedics;  Laterality: Left;  1 HR   LATISSIMUS FLAP TO BREAST Left 01/18/2019   Procedure: LEFT LATISSIMUS FLAP TO BREAST;  Surgeon: Wallace Going, DO;  Location: [place];  Service: Plastics;  Laterality: Left;   MASTECTOMY Left    MITRAL VALVE REPLACEMENT  mechanical   POLYPECTOMY  06/07/2018   Procedure: POLYPECTOMY;  Surgeon: Wilford Corner, MD;  [facility];  Service: Endoscopy;;   RIGHT/LEFT HEART CATH AND CORONARY ANGIOGRAPHY N/A 01/09/2021   Procedure: RIGHT/LEFT HEART CATH AND CORONARY ANGIOGRAPHY;  Surgeon: Jolaine Artist, MD;  Location: [place] [facility];  Service: Cardiovascular;  Laterality: N/A;   WOUND DEBRIDEMENT Left 12/28/2018   Procedure: EXCISION OF LEFT  CHRONIC CHEST WALL WOUND;  Surgeon: Coralie Keens, MD;  Location: [place];  Service: General;  Laterality: Left;     A IV Location/Drains/Wounds Patient Lines/Drains/Airways Status     Active Line/Drains/Airways     Name Placement date Placement time Site Days   Peripheral IV 08/27/22 20 G Posterior;Right Forearm 08/27/22  0610  Forearm  less than 1   Wound / Incision (Open or Dehisced) 01/06/21  Breast Left Masectomy Site 01/06/21  0800  Breast  598            Intake/Output Last 24 hours No intake or output data in the 24 hours ending 08/27/22 1330  Labs/Imaging Results for orders placed or performed during the hospital encounter of 08/27/22 (from the past 48 hour(s))  Resp panel by RT-PCR (RSV, Flu A&B, Covid) Anterior Nasal Swab     Status: Abnormal   Collection Time: 08/27/22  5:58 AM   Specimen: Anterior Nasal Swab  Result Value Ref Range   SARS Coronavirus 2 by RT PCR POSITIVE (A) NEGATIVE   Influenza A by PCR NEGATIVE NEGATIVE   Influenza B by PCR NEGATIVE NEGATIVE    Comment: (NOTE) The Xpert Xpress SARS-CoV-2/FLU/RSV plus assay is intended as an aid in the diagnosis of influenza from Nasopharyngeal swab specimens and should not be used as a sole basis for treatment. Nasal washings and aspirates are unacceptable for Xpert Xpress SARS-CoV-2/FLU/RSV testing.  Fact Sheet for Patients: EntrepreneurPulse.com.au  Fact Sheet for Healthcare Providers: IncredibleEmployment.be  This test is not yet approved or cleared by the [place] FDA and has been authorized for detection and/or diagnosis of SARS-CoV-2 by FDA under an Emergency Use Authorization (EUA). This EUA will remain in effect (meaning this test can be used) for the duration of the COVID-19 declaration under Section 564(b)(1) of the Act, 21 U.S.C. section 360bbb-3(b)(1), unless the authorization is terminated or revoked.     Resp Syncytial Virus by PCR NEGATIVE  NEGATIVE    Comment: (NOTE) Fact Sheet for Patients: EntrepreneurPulse.com.au  Fact Sheet for Healthcare Providers: IncredibleEmployment.be  This test is not yet approved or cleared by the [place] FDA and has been authorized for detection and/or diagnosis of SARS-CoV-2 by FDA under an Emergency Use Authorization (EUA). This EUA will remain in effect (meaning this test can be used) for the duration of the COVID-19 declaration under Section 564(b)(1) of the Act, 21 U.S.C. section 360bbb-3(b)(1), unless the authorization is terminated or revoked.  Performed at [facility], [place] [address] [place], [place] [address]   CBC     Status: Abnormal   Collection Time: 08/27/22  6:30 AM  Result Value Ref Range   WBC 8.7 4.0 - 10.5 K/uL   RBC 3.93 3.87 - 5.11 MIL/uL   Hemoglobin 13.1 12.0 - 15.0 g/dL   HCT 40.4 36.0 - 46.0 %   MCV 102.8 (H) 80.0 - 100.0 fL   MCH 33.3 26.0 - 34.0 pg   MCHC 32.4 30.0 - 36.0 g/dL   RDW 12.7 11.5 - 15.5 %   Platelets 495 (H) 150 -  400 K/uL   nRBC 0.0 0.0 - 0.2 %    Comment: Performed at [facility], [place] [address] [place], [place] [address]  Comprehensive metabolic panel     Status: Abnormal   Collection Time: 08/27/22  6:30 AM  Result Value Ref Range   Sodium 137 135 - 145 mmol/L   Potassium 3.3 (L) 3.5 - 5.1 mmol/L    Comment: HEMOLYSIS AT THIS LEVEL MAY AFFECT RESULT   Chloride 102 98 - 111 mmol/L   CO2 22 22 - 32 mmol/L   Glucose, Bld 99 70 - 99 mg/dL    Comment: Glucose reference range applies only to samples taken after fasting for at least 8 hours.   BUN 15 8 - 23 mg/dL   Creatinine, Ser 1.07 (H) 0.44 - 1.00 mg/dL   Calcium 8.6 (L) 8.9 - 10.3 mg/dL   Total Protein 7.6 6.5 - 8.1 g/dL   Albumin 3.2 (L) 3.5 - 5.0 g/dL   AST 38 15 - 41 U/L    Comment: HEMOLYSIS AT THIS LEVEL MAY AFFECT RESULT   ALT 18 0 - 44 U/L    Comment: HEMOLYSIS AT THIS LEVEL MAY AFFECT RESULT   Alkaline Phosphatase  149 (H) 38 - 126 U/L   Total Bilirubin 0.7 0.3 - 1.2 mg/dL    Comment: HEMOLYSIS AT THIS LEVEL MAY AFFECT RESULT   GFR, Estimated 55 (L) >60 mL/min    Comment: (NOTE) Calculated using the CKD-EPI Creatinine Equation (2021)    Anion gap 13 5 - 15    Comment: Performed at [facility], [place] [address] [place], [place] [address]  Troponin I (High Sensitivity)     Status: None   Collection Time: 08/27/22  6:30 AM  Result Value Ref Range   Troponin I (High Sensitivity) 10 <18 ng/L    Comment: (NOTE) Elevated high sensitivity troponin I (hsTnI) values and significant  changes across serial measurements may suggest ACS but many other  chronic and acute conditions are known to elevate hsTnI results.  Refer to the "Links" section for chest pain algorithms and additional  guidance. Performed at [facility], [place] [address], [place], [place] [address]   Brain natriuretic peptide     Status: Abnormal   Collection Time: 08/27/22  6:30 AM  Result Value Ref Range   B Natriuretic Peptide 154.9 (H) 0.0 - 100.0 pg/mL    Comment: Performed at [place] [address] [place], [place] [address]  Protime-INR     Status: Abnormal   Collection Time: 08/27/22  6:30 AM  Result Value Ref Range   Prothrombin Time 41.7 (H) 11.4 - 15.2 seconds   INR 4.4 (HH) 0.8 - 1.2    Comment: REPEATED TO VERIFY CRITICAL RESULT CALLED TO, READ BACK BY AND VERIFIED WITH: GINNY MULLIS RN.'@0730'$  ON 3.7.24 BY TCALDWELL MT. (NOTE) INR goal varies based on device and disease states. Performed at [facility], [place] [address], [place], [place] [address]   Troponin I (High Sensitivity)     Status: None   Collection Time: 08/27/22 10:06 AM  Result Value Ref Range   Troponin I (High Sensitivity) 13 <18 ng/L    Comment: (NOTE) Elevated high sensitivity troponin I (hsTnI) values and significant  changes across serial measurements may suggest ACS but many other  chronic and acute conditions are  known to elevate hsTnI results.  Refer to the "Links" section for chest pain algorithms and additional  guidance. Performed at [facility], [place] [address] [place], [place] [address]    *Note: Due to a large number of results and/or encounters for the requested time period, some results have not been displayed. A complete set of results can be found in Results Review.   DG Chest Port 1 View  Result Date: 08/27/2022 CLINICAL DATA:  Shortness of breath workup. EXAM: PORTABLE CHEST 1 VIEW COMPARISON:  Portable chest 03/07/2022, chest CT 03/09/2022. FINDINGS: The heart is enlarged. There is a prosthetic mitral valve and intact sternotomy sutures. There are no findings of acute CHF. The lungs emphysematous with subpleural interstitial fibrosis and chronic left apical and lingular radiation fibrosis. No focal pneumonia is evident and no pleural effusion. The mediastinum is normally outlined. Aortic atherosclerosis. Left axillary surgical clips are again noted. Osteopenia and degenerative change thoracic spine. IMPRESSION: 1. No acute radiographic findings. 2. Emphysema and chronic left apical and lingular radiation fibrosis. Background subpleural interstitial fibrosis 3. Cardiomegaly without evidence of acute CHF or pneumonia. 4. Aortic atherosclerosis. Electronically Signed   By: Telford Nab M.D.   On: 08/27/2022 06:51    Pending Labs Unresulted Labs (From admission, onward)     Start     Ordered   08/27/22 1307  Magnesium  Once,   R        08/27/22 1306            Vitals/Pain Today's Vitals   08/27/22 1015 08/27/22 1030 08/27/22 1045 08/27/22 1100  BP: 128/82 138/75 (!) 161/94 (!) 127/97  Pulse: 95 98 98 92  Resp: 19 (!) 21 (!) 27 17  Temp:      TempSrc:      SpO2: (!) 89% (!) 89% 92% 96%  PainSc:        Isolation Precautions Airborne and Contact precautions  Medications Medications  potassium chloride SA (KLOR-CON M) CR tablet 40 mEq (has no administration in time  range)  ipratropium-albuterol (DUONEB) 0.5-2.5 (3) MG/3ML nebulizer solution 3 mL (has no administration in time range)  guaiFENesin-dextromethorphan (ROBITUSSIN DM) 100-10 MG/5ML syrup 5 mL (has no administration in time range)  ipratropium-albuterol (DUONEB) 0.5-2.5 (3) MG/3ML nebulizer solution 3 mL (3 mLs Nebulization Given 08/27/22 0638)  methylPREDNISolone sodium succinate (SOLU-MEDROL) 125 mg/2 mL injection 125 mg (125 mg Intravenous Given 08/27/22 0638)  albuterol (PROVENTIL) (2.5 MG/3ML) 0.083% nebulizer solution 5 mg (5 mg Nebulization Given 08/27/22 0939)    Mobility walks     Focused Assessments Cardiac Assessment Handoff:    Lab Results  Component Value Date   TROPONINI 0.03 ([place]) 04/11/2018   Lab Results  Component Value Date   DDIMER 0.36 07/05/2019   Does the Patient currently have chest pain? No   , Neuro Assessment Handoff:  Swallow screen pass? Yes          Neuro Assessment: Within Defined Limits Neuro Checks:      Has TPA been given? No If patient is a Neuro Trauma and patient is going to OR before floor call report to [facility] nurse: [phone] or [phone]  , Pulmonary Assessment Handoff:  Lung sounds: Bilateral Breath Sounds: Expiratory wheezes O2 Device: Nasal Cannula O2 Flow Rate (L/min): 2 L/min (placed back on 2L cont. to desat resting in bed)    R Recommendations: See Admitting Provider Note  Report given to:   Additional Notes: na

## 2022-08-27 NOTE — ED Provider Notes (Signed)
[facility] Emergency Department Provider Note [name]:  [id number]  Arrival date & time: 08/27/22     Chief Complaint   Shortness of Breath   History of Present Illness   Kim Anthony is a 72 y.o. year-old female with a history of CHF, COPD, CAD, pulmonary embolism presenting to the ED with chief complaint of shortness of breath.  Patient explains that she had COVID last week.  Worsening shortness of breath with some mild chest tightness since yesterday evening.  No abdominal pain.  Review of Systems  A thorough review of systems was obtained and all systems are negative except as noted in the HPI and PMH.   Patient's Health History    Past Medical History:  Diagnosis Date   Arthritis    Asthma    Breast cancer ([place]) 2001   Left Breast Cancer   CHF (congestive heart failure) (HCC)    COPD (chronic obstructive pulmonary disease) (HCC)    Coronary artery disease    GERD (gastroesophageal reflux disease)    History of mitral valve replacement with mechanical valve    HLD (hyperlipidemia)    Hypertension    Pulmonary embolism ([place]) 2021    Past Surgical History:  Procedure Laterality Date   APPLICATION OF A-CELL OF CHEST/ABDOMEN Left 01/27/2019   Procedure: APPLICATION OF A-CELL OF CHEST;  Surgeon: Wallace Going, DO;  Location: [place];  Service: Plastics;  Laterality: Left;   APPLICATION OF WOUND VAC N/A 01/27/2019   Procedure: APPLICATION OF WOUND VAC;  Surgeon: Wallace Going, DO;  Location: [place];  Service: Plastics;  Laterality: N/A;   BREAST SURGERY Left 2001   for CA   CARDIOVERSION N/A 02/26/2022   Procedure: CARDIOVERSION;  Surgeon: Donato Heinz, MD;  Location: [place];  Service: Cardiovascular;  Laterality: N/A;   COLONOSCOPY WITH PROPOFOL N/A 06/07/2018   Procedure: COLONOSCOPY WITH PROPOFOL;  Surgeon: Wilford Corner, MD;  [facility];  Service: Endoscopy;  Laterality: N/A;   DEBRIDEMENT AND CLOSURE  WOUND Left 12/28/2018   Procedure: Debridement And Closure Wound;  Surgeon: Coralie Keens, MD;  Location: [place];  Service: General;  Laterality: Left;   INCISION AND DRAINAGE OF WOUND Left 01/27/2019   Procedure: IRRIGATION AND DEBRIDEMENT OF CHEST WALL;  Surgeon: Wallace Going, DO;  Location: [place];  Service: Plastics;  Laterality: Left;   KNEE ARTHROSCOPY WITH MEDIAL MENISECTOMY Left 08/06/2021   Procedure: KNEE ARTHROSCOPY WITH PARTIAL MEDIAL MENISECTOMY, DEBRIDEMENT;  Surgeon: Susa Day, MD;  [facility];  Service: Orthopedics;  Laterality: Left;  1 HR   LATISSIMUS FLAP TO BREAST Left 01/18/2019   Procedure: LEFT LATISSIMUS FLAP TO BREAST;  Surgeon: Wallace Going, DO;  Location: [place];  Service: Plastics;  Laterality: Left;   MASTECTOMY Left    MITRAL VALVE REPLACEMENT     mechanical   POLYPECTOMY  06/07/2018   Procedure: POLYPECTOMY;  Surgeon: Wilford Corner, MD;  [facility];  Service: Endoscopy;;   RIGHT/LEFT HEART CATH AND CORONARY ANGIOGRAPHY N/A 01/09/2021   Procedure: RIGHT/LEFT HEART CATH AND CORONARY ANGIOGRAPHY;  Surgeon: Jolaine Artist, MD;  Location: [place] [facility];  Service: Cardiovascular;  Laterality: N/A;   WOUND DEBRIDEMENT Left 12/28/2018   Procedure: EXCISION OF LEFT CHRONIC CHEST WALL WOUND;  Surgeon: Coralie Keens, MD;  Location: [place];  Service: General;  Laterality: Left;    Family History  Problem Relation Age of Onset   Hypertension Mother    Cancer Father  Hypertension Father    Breast cancer Maternal Aunt 28   Heart attack Other     Social History   Socioeconomic History   Marital status: Single    Spouse name: Not on file   Number of children: Not on file   Years of education: Not on file   Highest education level: Not on file  Occupational History   Not on file  Tobacco Use   Smoking status: Every Day    Packs/day: 0.10    Years: 30.00    Total pack years: 3.00    Types: Cigarettes    Smokeless tobacco: Never   Tobacco comments:    5 cigs per day  Vaping Use   Vaping Use: Never used  Substance and Sexual Activity   Alcohol use: Yes    Alcohol/week: 3.0 standard drinks of alcohol    Types: 3 Standard drinks or equivalent per week    Comment: beer- ocasional   Drug use: No   Sexual activity: Not Currently    Birth control/protection: None  Other Topics Concern   Not on file  Social History Narrative   ** Merged History Encounter **       Social Determinants of Health   Financial Resource Strain: Medium Risk (02/27/2021)   Overall Financial Resource Strain (CARDIA)    Difficulty of Paying Living Expenses: Somewhat hard  Food Insecurity: No Food Insecurity (02/24/2022)   Hunger Vital Sign    Worried About Running Out of Food in the Last Year: Never true    Ran Out of Food in the Last Year: Never true  Transportation Needs: No Transportation Needs (03/06/2022)   PRAPARE - Hydrologist (Medical): No    Lack of Transportation (Non-Medical): No  Physical Activity: Not on file  Stress: Not on file  Social Connections: Socially Isolated (11/28/2019)   Social Connection and Isolation Panel [NHANES]    Frequency of Communication with Friends and Family: More than three times a week    Frequency of Social Gatherings with Friends and Family: More than three times a week    Attends Religious Services: Never    Marine scientist or Organizations: No    Attends Music therapist: Not on file    Marital Status: Never married  Intimate Partner Violence: Not on file     Physical Exam   Vitals:   08/27/22 0557  BP: 123/69  Pulse: 89  Resp: 20  Temp: 98 F (36.7 C)  SpO2: 91%    CONSTITUTIONAL: Well-appearing, NAD NEURO/PSYCH:  Alert and oriented x 3, no focal deficits EYES:  eyes equal and reactive ENT/NECK:  no LAD, no JVD CARDIO: Regular rate, well-perfused, normal S1 and S2 PULM: Mild tachypnea and wheezing GI/GU:   non-distended, non-tender MSK/SPINE:  No gross deformities, no edema SKIN:  no rash, atraumatic   *Additional and/or pertinent findings included in MDM below  Diagnostic and Interventional Summary    EKG Interpretation  Date/Time:  August 27, 2022 at 05: 56: 47 Ventricular Rate:   83 PR Interval:   162 QRS Duration:  140 QT Interval:  440  QTC Calculation:  518 R Axis:     Text Interpretation: Sinus rhythm, nonspecific findings       Labs Reviewed  RESP PANEL BY RT-PCR (RSV, FLU A&B, COVID)  [id number]  CBC  COMPREHENSIVE METABOLIC PANEL  BRAIN NATRIURETIC PEPTIDE  PROTIME-INR  TROPONIN I (HIGH SENSITIVITY)    DG Chest Port 1  View    (Results Pending)    Medications  ipratropium-albuterol (DUONEB) 0.5-2.5 (3) MG/3ML nebulizer solution 3 mL (3 mLs Nebulization Given 08/27/22 [id number])  methylPREDNISolone sodium succinate (SOLU-MEDROL) 125 mg/2 mL injection 125 mg (125 mg Intravenous Given 08/27/22 [id number])     Procedures  /  Critical Care Procedures  ED Course and Medical Decision Making  Initial Impression and Ddx Differential diagnosis includes COVID, pneumonia, CHF, COPD exacerbation, less likely PE, less likely ACS.  Past medical/surgical history that increases complexity of ED encounter: CHF, COPD  Interpretation of Diagnostics I personally reviewed the EKG and my interpretation is as follows: Sinus rhythm, nonspecific findings possibly suggestive of ischemia  Awaiting labs, chest x-ray  Patient Reassessment and Ultimate Disposition/Management     Signed out to oncoming provider.  Patient management required discussion with the following services or consulting groups:  None  Complexity of Problems Addressed Acute illness or injury that poses threat of life of bodily function  Additional Data Reviewed and Analyzed Further history obtained from: None  Additional Factors Impacting ED Encounter Risk None  Barth Kirks. Sedonia Small, MD [place] [email or web address]  Final Clinical Impressions(s) / ED Diagnoses     ICD-10-CM   1. SOB (shortness of breath)  R06.02       ED Discharge Orders     None        Discharge Instructions Discussed with and Provided to Patient:   Discharge Instructions   None      Maudie Flakes, MD 08/27/22 [phone]

## 2022-08-27 NOTE — ED Notes (Signed)
Pt O2 was taken off for trial as pt does not normally require at home

## 2022-08-27 NOTE — Progress Notes (Signed)
ANTICOAGULATION CONSULT NOTE - Initial Consult  Pharmacy Consult for Coumadin Indication: atrial fibrillation, pulmonary embolus, and MV  Allergies  Allergen Reactions   Lisinopril Swelling   Acetaminophen-Codeine Itching   Propoxyphene Itching    Darvocet   Tape Rash    Plastic   Tramadol Itching, Nausea And Vomiting and Rash    Patient Measurements: Height: '5\' 5"'$  (165.1 cm) Weight: 74 kg (163 lb 2.3 oz) IBW/kg (Calculated) : 57 Heparin Dosing Weight:   Vital Signs: Temp: 97.8 F (36.6 C) (03/07 1516) Temp Source: Oral (03/07 1516) BP: 129/45 (03/07 1516) Pulse Rate: 95 (03/07 1516)  Labs: Recent Labs    08/27/22 0630 08/27/22 1006  HGB 13.1  --   HCT 40.4  --   PLT 495*  --   LABPROT 41.7*  --   INR 4.4*  --   CREATININE 1.07*  --   TROPONINIHS 10 13    Estimated Creatinine Clearance: 47.9 mL/min (A) (by C-G formula based on SCr of 1.07 mg/dL (H)).   Medical History: Past Medical History:  Diagnosis Date   Arthritis    Asthma    Breast cancer ([place]) 2001   Left Breast Cancer   CHF (congestive heart failure) (HCC)    COPD (chronic obstructive pulmonary disease) (HCC)    Coronary artery disease    GERD (gastroesophageal reflux disease)    History of mitral valve replacement with mechanical valve    HLD (hyperlipidemia)    Hypertension    Pulmonary embolism ([place]) 2021    Medications:  Medications Prior to Admission  Medication Sig Dispense Refill Last Dose   albuterol (VENTOLIN HFA) 108 (90 Base) MCG/ACT inhaler Inhale 2 puffs into the lungs every 6 (six) hours as needed for wheezing or shortness of breath. 8 g 5 unk   diphenhydrAMINE (BENADRYL) 25 MG tablet Take 75 mg by mouth daily as needed for allergies.   unk   DULoxetine (CYMBALTA) 20 MG capsule Take 20 mg by mouth at bedtime.   08/26/2022   famotidine (PEPCID) 20 MG tablet Take 1 tablet (20 mg total) by mouth 2 (two) times daily. 60 tablet 0 08/26/2022   FARXIGA 10 MG TABS tablet Take 1 tablet by  mouth once daily 90 tablet 0 08/26/2022   fluticasone (FLONASE) 50 MCG/ACT nasal spray Place 1 spray into both nostrils daily as needed for allergies. 9.9 mL 2 unk   gabapentin (NEURONTIN) 600 MG tablet TAKE 1 & 1/2 (ONE & ONE-HALF) TABLETS BY MOUTH THREE TIMES DAILY (Patient taking differently: Take 900 mg by mouth 3 (three) times daily.) 90 tablet 0 08/26/2022   HYDROcodone-acetaminophen (NORCO) 5-325 MG tablet Take 1 tablet by mouth every 4 (four) hours as needed for moderate pain. 150 tablet 0 08/26/2022   losartan (COZAAR) 25 MG tablet Take 0.5 tablets (12.5 mg total) by mouth daily. 45 tablet 2 08/26/2022   metoprolol succinate (TOPROL-XL) 25 MG 24 hr tablet Take 1 tablet (25 mg total) by mouth daily. 90 tablet 2 08/26/2022 at 0900   pantoprazole (PROTONIX) 40 MG tablet Take 1 tablet (40 mg total) by mouth daily. 90 tablet 0 08/26/2022   potassium chloride SA (KLOR-CON M) 20 MEQ tablet Take 2 tablets (40 mEq total) by mouth 2 (two) times daily. (Patient taking differently: Take 20 mEq by mouth 2 (two) times daily.) 120 tablet 6 08/26/2022   simvastatin (ZOCOR) 20 MG tablet Take 1 tablet (20 mg total) by mouth daily. 90 tablet 2 08/26/2022   Tiotropium Bromide-Olodaterol (STIOLTO RESPIMAT)  2.5-2.5 MCG/ACT AERS Inhale 2 puffs by mouth daily 4 g 6 unk   torsemide (DEMADEX) 20 MG tablet Take 2 tablets (40 mg total) by mouth daily. 60 tablet 3 08/26/2022   warfarin (COUMADIN) 5 MG tablet Give one (1) tablet on Sundays and Wednesdays. On all other days, give only one-half (1/2) tablet 20 tablet 2 08/26/2022   amiodarone (PACERONE) 200 MG tablet Take 1 tablet (200 mg total) by mouth daily. (Patient not taking: Reported on 08/27/2022) 90 tablet 2 Not Taking   Spacer/Aero-Holding Chambers (BREATHERITE COLL SPACER ADULT) MISC 1 applicator by Does not apply route daily. 1 each 0    spironolactone (ALDACTONE) 25 MG tablet Take 1 tablet by mouth once daily (Patient not taking: Reported on 08/27/2022) 90 tablet 3 Not Taking    Scheduled:   arformoterol  15 mcg Nebulization BID   And   umeclidinium bromide  1 puff Inhalation Daily   dapagliflozin propanediol  10 mg Oral Daily   [START ON 08/28/2022] DULoxetine  20 mg Oral Daily   ipratropium-albuterol  3 mL Nebulization Q4H   metoprolol succinate  25 mg Oral Daily   pantoprazole  40 mg Oral Daily   potassium chloride  40 mEq Oral BID   simvastatin  20 mg Oral Daily   torsemide  40 mg Oral Daily   Infusions:   Assessment: Pt has been on coumadin for a hx of MVR/afib/PE. Admission INR 4.4 today. We will hold dose today and f/u in AM.   Goal of Therapy:  INR 2.5-3.5 Monitor platelets by anticoagulation protocol: Yes   Plan:  No coumadin today Daily INR  Onnie Boer, PharmD, BCIDP, AAHIVP, CPP Infectious Disease Pharmacist 08/27/2022 5:29 PM

## 2022-08-27 NOTE — H&P (Addendum)
Date: 08/27/2022               Patient Name:  Kim Anthony [name]: [id number]  DOB: 04/26/1951 Age / Sex: 72 y.o., female   PCP: Iona Coach, MD         Medical Service: Internal Medicine Teaching Service         Attending Physician: Dr. Charise Killian, MD      First Contact: Dr. Starlyn Skeans, MD Pager [phone]    Second Contact: Dr. Delene Ruffini, MD Pager [phone]         After Hours (After 5p/  First Contact Pager: [phone]  weekends / holidays): Second Contact Pager: [phone]   SUBJECTIVE   Chief Complaint: Shortness of breath  History of Present Illness:   Kim Anthony is a 72 y/o person living with a history of COPD, asthma, chronic combined CHF, CAD, Hx of DVT/PE on anticoagulation, HTN, Hx of mechanical mitral valve replacement, breast cancer in 2001 s/p chemo/radiation/left mastectomy, OA, sciatica GERD, paroxysmal atrial fibrillation, and tobacco use disorder who presented to the ED after shortness of breath for the last day. She was diagnosed with COVID-19 infection 2 weeks ago with symptoms of nasal congestion, decreased appetite, chills, and a dry cough. She was treated symptomatically as she was having symptoms 7 days prior to that appointment with our clinic.   She felt fell over the past few days, but became short of breath last night with worsening of her chronic cough, now with yellow sputum. She denies fever, chills, nausea, vomiting, or diarrhea. She denies any lower extremity swelling. She has had three COVID vaccines. She notes it has been a while since her last COPD exacerbation admission, she has never required bipap or intubation. She has been adherent to her daily inhalers  ED Course:  Meds:  Albuterol PRN  Farxiga 10 mg daily Duloxetine 20 mg daily Pepcid 20 mg BID Protonix 40 mg daily Flonase daily Gabapentin 900 mg mg TID Norco 5-5-325 mg q4h prn Metoprolol succinate 25 mg daily Simvastatin 20 mg daily Stiolto 2.5-2.5 mcg/act 2 puffs daily Torsemide 40  mg daily Warfarin 5 mg 1 tab Sun/Wed and a half tab all other days Spironolactone 25 mg daily  Past Medical History COPD Asthma CHF Pulmonary embolism Mitral valve replacement, mechanical valve on warfarin Breast cancer 2001, chemotherapy,radiation, left mastectomy.  Osteoarthritis Sciatica GERD Atrial fibrillation s/p cardioversion   Past Surgical History:  Procedure Laterality Date   APPLICATION OF A-CELL OF CHEST/ABDOMEN Left 01/27/2019   Procedure: APPLICATION OF A-CELL OF CHEST;  Surgeon: Wallace Going, DO;  Location: [place];  Service: Plastics;  Laterality: Left;   APPLICATION OF WOUND VAC N/A 01/27/2019   Procedure: APPLICATION OF WOUND VAC;  Surgeon: Wallace Going, DO;  Location: [place];  Service: Plastics;  Laterality: N/A;   BREAST SURGERY Left 2001   for CA   CARDIOVERSION N/A 02/26/2022   Procedure: CARDIOVERSION;  Surgeon: Donato Heinz, MD;  Location: [place];  Service: Cardiovascular;  Laterality: N/A;   COLONOSCOPY WITH PROPOFOL N/A 06/07/2018   Procedure: COLONOSCOPY WITH PROPOFOL;  Surgeon: Wilford Corner, MD;  [facility];  Service: Endoscopy;  Laterality: N/A;   DEBRIDEMENT AND CLOSURE WOUND Left 12/28/2018   Procedure: Debridement And Closure Wound;  Surgeon: Coralie Keens, MD;  Location: [place];  Service: General;  Laterality: Left;   INCISION AND DRAINAGE OF WOUND Left 01/27/2019   Procedure: IRRIGATION AND DEBRIDEMENT OF CHEST WALL;  Surgeon: Wallace Going,  DO;  Location: [place];  Service: Plastics;  Laterality: Left;   KNEE ARTHROSCOPY WITH MEDIAL MENISECTOMY Left 08/06/2021   Procedure: KNEE ARTHROSCOPY WITH PARTIAL MEDIAL MENISECTOMY, DEBRIDEMENT;  Surgeon: Susa Day, MD;  [facility];  Service: Orthopedics;  Laterality: Left;  1 HR   LATISSIMUS FLAP TO BREAST Left 01/18/2019   Procedure: LEFT LATISSIMUS FLAP TO BREAST;  Surgeon: Wallace Going, DO;  Location: [place];  Service: Plastics;   Laterality: Left;   MASTECTOMY Left    MITRAL VALVE REPLACEMENT     mechanical   POLYPECTOMY  06/07/2018   Procedure: POLYPECTOMY;  Surgeon: Wilford Corner, MD;  [facility];  Service: Endoscopy;;   RIGHT/LEFT HEART CATH AND CORONARY ANGIOGRAPHY N/A 01/09/2021   Procedure: RIGHT/LEFT HEART CATH AND CORONARY ANGIOGRAPHY;  Surgeon: Jolaine Artist, MD;  Location: [place] [facility];  Service: Cardiovascular;  Laterality: N/A;   WOUND DEBRIDEMENT Left 12/28/2018   Procedure: EXCISION OF LEFT CHRONIC CHEST WALL WOUND;  Surgeon: Coralie Keens, MD;  Location: [place];  Service: General;  Laterality: Left;    Social:  Lives With: Daughter Occupation: Retired Support: Family/friends, she continues to drive. She uses a cain as needed if her arhtiritis flairs Level of Function: Able to perform ADL/iADL PCP: Iona Coach MD  Substances: Smokes cigarettes 1 pack will last her 3 days. She has been smoking for the last 29 years. Drinks a beer or mixed drink once a week. No other substances  Family History  Problem Relation Age of Onset   Hypertension Mother    Cancer Father    Hypertension Father    Breast cancer Maternal Aunt 35   Heart attack Other    Allergies: Allergies as of 08/27/2022 - Review Complete 08/27/2022  Allergen Reaction Noted   Lisinopril Swelling 01/07/2021   Acetaminophen-codeine Itching 07/10/2015   Propoxyphene Itching 01/18/2019   Tape Rash 03/09/2019   Tramadol Itching, Nausea And Vomiting, and Rash 07/10/2015   Review of Systems: A complete ROS was negative except as per HPI.   OBJECTIVE:   Physical Exam: Blood pressure (!) 131/93, pulse 83, temperature 97.9 F (36.6 C), temperature source Oral, resp. rate (!) 24, SpO2 97 %.  Constitutional: no acute distress HENT: normocephalic atraumatic, mucous membranes dry Eyes: conjunctiva non-erythematous Neck: supple Cardiovascular: regular rate and rhythm, mitral click Pulmonary/Chest: normal  work of breathing on room air, decreased lung sounds at the bases Abdominal: soft, non-tender, non-distended MSK: normal bulk and tone. No lower extremity edema Neurological: alert & oriented x 3 Skin: warm and dry Psych: pleasant  Labs: CBC    Latest Ref Rng & Units 08/27/2022    6:30 AM 06/11/2022   10:19 AM 04/01/2022   11:23 AM  CBC  WBC 4.0 - 10.5 K/uL 8.7  7.8  6.3   Hemoglobin 12.0 - 15.0 g/dL 13.1  14.4  14.2   Hematocrit 36.0 - 46.0 % 40.4  46.0  44.9   Platelets 150 - 400 K/uL 495  423  511      CMP     Latest Ref Rng & Units 08/27/2022    6:30 AM 06/11/2022   10:19 AM 04/10/2022   11:09 AM  CMP  Glucose 70 - 99 mg/dL 99  103  95   BUN 8 - 23 mg/dL '15  30  14   '$ Creatinine 0.44 - 1.00 mg/dL 1.07  1.19  1.16   Sodium 135 - 145 mmol/L 137  134  134   Potassium  3.5 - 5.1 mmol/L 3.3  3.8  4.0   Chloride 98 - 111 mmol/L 102  103  100   CO2 22 - 32 mmol/L '22  21  20   '$ Calcium 8.9 - 10.3 mg/dL 8.6  9.1  9.5   Total Protein 6.5 - 8.1 g/dL 7.6     Total Bilirubin 0.3 - 1.2 mg/dL 0.7     Alkaline Phos 38 - 126 U/L 149     AST 15 - 41 U/L 38     ALT 0 - 44 U/L 18       Imaging:  Chest Xray 1. No acute radiographic findings. 2. Emphysema and chronic left apical and lingular radiation fibrosis. Background subpleural interstitial fibrosis 3. Cardiomegaly without evidence of acute CHF or pneumonia. 4. Aortic atherosclerosis.  EKG: personally reviewed my interpretation is 80 bpm with a RBBB. Nonspecific QRS changes in V2/V3. Prior EKG from 02/2022, changes in V2/V3  ASSESSMENT & PLAN:   Assessment & Plan by Problem: Principal Problem:   COPD exacerbation ([place])  Kim Anthony is a 72 y.o. person living with a history of suspected COPD/asthma, combined HF, tobacco use disorder who presented with dyspnea and a cough and admitted for suspected COPD vs asthma exacerbation on hospital day 0  Acute hypoxic respiratory failure COPD exacerbation Patient notes history of  COPD and asthma. On chart review no PFT's on file, but has been prescribed stioloto, PRN albuterol, and PRN nebulizer. On chart review appears our clinic has ordered PFT's in the past but they have never been done. She had improvement after her COVID-19 infection until she had increased dyspnea and a productive cough. Suspect COPD exacerbation. She denies fever,chills and without leukocytosis and focal opacity on xray. On exam she is saturating well on 2L [place] (not on oxygen at home) with decreased breath sounds at the lung bases. Absent wheezing but this was after receiving duonebs in the ED. COVID 19 likely still positive from prior infection, do not suspect infectious etiology or post viral pneumonia. She has bene adherent ot warfarin and is supratheraputic, do not suspect PE. She feels better since her breathing treatment and IV solumedrol in the EDWill treat for COPD/asthma exacerbation.  -scheduled duonebs q4h -1 dose of methylpred in ED, continue with PO prednisone for 4 days -brovana and incruse ellipta daily  -albuterol PRN -assess peak flow -wean oxygen as able -robitussin DM PRN -flutter valve -smoking cessation  Supratheraputic INR Hx of MVR Has MVR, placed in [place] in 2001. Has been on stable warfarin dosing and follows with Dr. Elie Confer in the [facility]. Has not had her INR levels checked in a few weeks per the patient. She has continued taking her warfarin. INR on admission of 4.4. Do not believe she needs reversal therapies, no evidence of bleeding. Will repeat INR tomorrow and dose warfarin accordingly. Last heart failure note mentions SBE prophylaxis with her valve, will need to follow up tomorrow and address this. No Abx on medication list, unclear if this is carried forward from prior notes. -hold warfarin -repeat INR tomorrow morning.  -follow up on SBE prophylaxis  Chronic combined heart failure NYHA II Last echocardiogram 11/23 EF 35-40% with severe RV dysfunction  and stable MVR. Severe TR. Euvolemic on examination. Do not suspect CHF exacerbation. BNP minimally elevated at 154, prior hospitalizations for CHF BNP much higher. -continue torsemide 40 mg daily GDMT of spiro 25 mg daily, toprol XL 25 mg daily, and farxiga 10 mg daily -  monitor fluid status -no longer on arni/arb  History of pulmonary embolism/DVT Remote history of PE (chart review documents in regard to this since 2017). Has been adherent to warfarin with MVR. Do not suspect PE/DVT. Denies lower extremity pain, swelling, or redness. She is not tachycardic on examination and has a supratherpautic INR. If no further improvement in breathing with treatment can consider CT angio chest -continue to montior  CKD IIIa GFR 55 today, stable over last 5 months.  -continue to montior  Hypokalemia Potassium of 3.3. Magnesium normal. Will replete and continue home potassium supplements  History of tobacco use disorder Discussed cessation she is interested in quitting -discuss nicotine replacement therapy. Has tried chantix in the past, can offer at discharge with nicotine replacement therapy  Hx of atrial fibrillation Hx of DCCV. In normal sinus rhythm -continue to monitor   Chronic pain Patient endorses chronic pain from diffuse arthritis. She follows with Dr. Dagoberto Ligas of [facility].  -continue home norco and gabapentin -PDMP appropriate  Depression Continue home duloxetine  Diet: Normal VTE: None, holding warfarin with elevated INR IVF: None,None Code: Full  Prior to Admission Living Arrangement: Home  Anticipated Discharge Location: Home Barriers to Discharge: further improvement in hypoxia   Dispo: Admit patient to Observation with expected length of stay less than 2 midnights.  Signed: Riesa Pope, MD Internal Medicine Resident PGY-3  08/27/2022, 4:49 PM

## 2022-08-28 ENCOUNTER — Other Ambulatory Visit (HOSPITAL_COMMUNITY): Payer: Self-pay

## 2022-08-28 DIAGNOSIS — F17210 Nicotine dependence, cigarettes, uncomplicated: Secondary | ICD-10-CM

## 2022-08-28 DIAGNOSIS — J9601 Acute respiratory failure with hypoxia: Secondary | ICD-10-CM

## 2022-08-28 DIAGNOSIS — J441 Chronic obstructive pulmonary disease with (acute) exacerbation: Secondary | ICD-10-CM | POA: Diagnosis not present

## 2022-08-28 LAB — BASIC METABOLIC PANEL
Anion gap: 10 (ref 5–15)
BUN: 20 mg/dL (ref 8–23)
CO2: 26 mmol/L (ref 22–32)
Calcium: 8.1 mg/dL — ABNORMAL LOW (ref 8.9–10.3)
Chloride: 103 mmol/L (ref 98–111)
Creatinine, Ser: 0.99 mg/dL (ref 0.44–1.00)
GFR, Estimated: >60 mL/min (ref >60)
Glucose, Bld: 90 mg/dL (ref 70–99)
Potassium: 2.8 mmol/L — ABNORMAL LOW (ref 3.5–5.1)
Sodium: 139 mmol/L (ref 135–145)

## 2022-08-28 LAB — PROTIME-INR
INR: 3.9 — ABNORMAL HIGH (ref 0.8–1.2)
Prothrombin Time: 37.6 seconds — ABNORMAL HIGH (ref 11.4–15.2)

## 2022-08-28 LAB — POTASSIUM: Potassium: 4 mmol/L (ref 3.5–5.1)

## 2022-08-28 MED ORDER — POTASSIUM CHLORIDE 20 MEQ PO PACK
40.0000 meq | PACK | Freq: Once | ORAL | Status: AC
  Administered 2022-08-28: 40 meq via ORAL
  Filled 2022-08-28: qty 2

## 2022-08-28 MED ORDER — AZITHROMYCIN 500 MG PO TABS
500.0000 mg | ORAL_TABLET | Freq: Every day | ORAL | Status: DC
  Administered 2022-08-28 – 2022-08-29 (×2): 500 mg via ORAL
  Filled 2022-08-28 (×2): qty 1

## 2022-08-28 MED ORDER — POTASSIUM CHLORIDE 10 MEQ/100ML IV SOLN
10.0000 meq | INTRAVENOUS | Status: DC
  Filled 2022-08-28: qty 100

## 2022-08-28 MED ORDER — POTASSIUM CHLORIDE CRYS ER 20 MEQ PO TBCR
20.0000 meq | EXTENDED_RELEASE_TABLET | Freq: Two times a day (BID) | ORAL | Status: DC
  Administered 2022-08-28 – 2022-08-29 (×2): 20 meq via ORAL
  Filled 2022-08-28 (×2): qty 1

## 2022-08-28 MED ORDER — IPRATROPIUM-ALBUTEROL 0.5-2.5 (3) MG/3ML IN SOLN
3.0000 mL | Freq: Four times a day (QID) | RESPIRATORY_TRACT | Status: DC
  Filled 2022-08-28: qty 3

## 2022-08-28 MED ORDER — FLUTICASONE FUROATE-VILANTEROL 200-25 MCG/ACT IN AEPB
1.0000 | INHALATION_SPRAY | Freq: Every day | RESPIRATORY_TRACT | Status: DC
  Administered 2022-08-29: 1 via RESPIRATORY_TRACT
  Filled 2022-08-28: qty 28

## 2022-08-28 MED ORDER — ACETAMINOPHEN 325 MG PO TABS
650.0000 mg | ORAL_TABLET | Freq: Four times a day (QID) | ORAL | Status: DC
  Administered 2022-08-28 – 2022-08-29 (×4): 650 mg via ORAL
  Filled 2022-08-28 (×5): qty 2

## 2022-08-28 NOTE — Progress Notes (Signed)
HD#0 SUBJECTIVE:  Patient Summary: Kim Anthony is a 72 y.o. with a pertinent PMH of COPD, asthma, chronic combined CHF, CAD, Hx of DVT/PE on anticoagulation, HTN, Hx of mechanical mitral valve replacement, breast cancer in 2001 s/p chemo/radiation/left mastectomy, OA, sciatica GERD, paroxysmal atrial fibrillation, and tobacco use disorder who presented to the ED after shortness of breath for the last day and admitted for COPD exacerbation.    Interm History: She was diagnosed with COVID-19 infection 2 weeks ago with symptoms of nasal congestion, decreased appetite, chills, and a dry cough. She was treated symptomatically as she was having symptoms 7 days prior to that appointment with our clinic.   Overnight Events: Patient states that her breathing has improved somewhat and she slept okay. She denies any chest pain, fever, or chills. She is interested in tobacco cessation and has tried using Chantix in the past. She is willing to try Chantix again at discharge because "my breathing has gotten really bad."   OBJECTIVE:  Vital Signs: Vitals:   08/28/22 0057 08/28/22 0532 08/28/22 0807 08/28/22 1221  BP:  107/60 133/81   Pulse: 79 80 88   Resp: 18 16 (!) 21   Temp:  (!) 97.5 F (36.4 C) (!) 97.5 F (36.4 C)   TempSrc:  Oral Oral   SpO2: 99% 97% 96% 97%  Weight:      Height:       Supplemental O2: Room Air SpO2: 97 % O2 Flow Rate (L/min): 2 L/min  Filed Weights   08/27/22 1516  Weight: 74 kg     Intake/Output Summary (Last 24 hours) at 08/28/2022 1350 Last data filed at 08/27/2022 1743 Gross per 24 hour  Intake 240 ml  Output --  Net 240 ml   Net IO Since Admission: 240 mL [08/28/22 1350]  Physical Exam: Physical Exam Constitutional:      General: She is not in acute distress. HENT:     Head: Normocephalic and atraumatic.     Mouth/Throat:     Mouth: Mucous membranes are moist.  Cardiovascular:     Rate and Rhythm: Normal rate and regular rhythm.     Comments:  Mitral click present Pulmonary:     Effort: Pulmonary effort is normal.     Comments: Mild expiratory wheezes bilaterally throughout all lung fields Abdominal:     General: Bowel sounds are normal.     Palpations: Abdomen is soft.     Tenderness: There is no abdominal tenderness.  Musculoskeletal:        General: Normal range of motion.  Skin:    General: Skin is warm and dry.  Neurological:     General: No focal deficit present.     Mental Status: She is alert.  Psychiatric:        Mood and Affect: Mood normal.        Behavior: Behavior normal.     Patient Lines/Drains/Airways Status     Active Line/Drains/Airways     Name Placement date Placement time Site Days   Peripheral IV 08/27/22 20 G Posterior;Right Forearm 08/27/22  0610  Forearm  1   Wound / Incision (Open or Dehisced) 01/06/21  Breast Left Masectomy Site 01/06/21  0800  Breast  599            Pertinent Labs:    Latest Ref Rng & Units 08/27/2022    6:30 AM 06/11/2022   10:19 AM 04/01/2022   11:23 AM  CBC  WBC 4.0 -  10.5 K/uL 8.7  7.8  6.3   Hemoglobin 12.0 - 15.0 g/dL 13.1  14.4  14.2   Hematocrit 36.0 - 46.0 % 40.4  46.0  44.9   Platelets 150 - 400 K/uL 495  423  511        Latest Ref Rng & Units 08/28/2022    6:08 AM 08/27/2022    6:30 AM 06/11/2022   10:19 AM  CMP  Glucose 70 - 99 mg/dL 90  99  103   BUN 8 - 23 mg/dL '20  15  30   '$ Creatinine 0.44 - 1.00 mg/dL 0.99  1.07  1.19   Sodium 135 - 145 mmol/L 139  137  134   Potassium 3.5 - 5.1 mmol/L 2.8  3.3  3.8   Chloride 98 - 111 mmol/L 103  102  103   CO2 22 - 32 mmol/L '26  22  21   '$ Calcium 8.9 - 10.3 mg/dL 8.1  8.6  9.1   Total Protein 6.5 - 8.1 g/dL  7.6    Total Bilirubin 0.3 - 1.2 mg/dL  0.7    Alkaline Phos 38 - 126 U/L  149    AST 15 - 41 U/L  38    ALT 0 - 44 U/L  18      No results for input(s): "GLUCAP" in the last 72 hours.   Pertinent Imaging: EXAM: PORTABLE CHEST 1 VIEW   FINDINGS: The heart is enlarged. There is a  prosthetic mitral valve and intact sternotomy sutures.   There are no findings of acute CHF. The lungs emphysematous with subpleural interstitial fibrosis and chronic left apical and lingular radiation fibrosis.   No focal pneumonia is evident and no pleural effusion. The mediastinum is normally outlined. Aortic atherosclerosis.   Left axillary surgical clips are again noted. Osteopenia and degenerative change thoracic spine.   IMPRESSION: 1. No acute radiographic findings. 2. Emphysema and chronic left apical and lingular radiation fibrosis. Background subpleural interstitial fibrosis 3. Cardiomegaly without evidence of acute CHF or pneumonia. 4. Aortic atherosclerosis.     Electronically Signed   By: Telford Nab M.D.   On: 08/27/2022 06:51  ASSESSMENT/PLAN:  Assessment: Principal Problem:   COPD exacerbation ([place])   Kim Anthony is a 72 y.o. with pertinent PMH of COPD/asthma, combined HF, tobacco use disorder who presented with dyspnea and a cough and admitted for COPD exacerbation, currently on hospital day 1.  Plan: #Acute hypoxic respiratory failure #COPD exacerbation Recent COVID-19 infection. Presented w/ worsening SOB and cough. Reports history of asthma vs COPD. PFTs ordered as outpatient but were never completed. Taking stioloto, albuterol, and nebulizer at home. COVID-19 positive here but likely from prior infection. Remains afebrile w/o leukocytosis. Low suspicion for pneumonia at this time. Not on oxygen at home but required oxygen on admission. Weaned to room air today without desaturating. Will add azithromycin for anti-inflammatory properties. She will need triple therapy at discharge.  -PO prednisone 40 mg daily (day 2/4) -Azithromycin 500 mg daily (day 1/3) -Incruse ellipta -Breo ellipta -Fluticasone -Albuterol PRN -Robitussin DM PRN -Flutter valve   #Supratheraputic INR #Hx of Mitral Valve Replacement MVR placed in 2001. Follows outpatient w/ Dr.  Elie Confer in [facility] coumadin clinic for home warfarin. Reports compliance w/ warfarin. INR 4.4 on admission. No signs of bleeding.  Most recent heart failure note mentions SBE prophylaxis with her valve, however no abx on medication list. Will need to f/u need for prophylaxis.  -Hold warfarin -Trend INR   #  Chronic combined heart failure NYHA II Echo from 04/2022 w/ EF 35-40%, severe RV dysfunction, stable MVR, and severe TR. BNP minimally elevated at 154. Remains euvolemic on exam.  -Continue torsemide 40 mg daily, spiro 25 mg daily, toprol XL 25 mg daily, and farxiga 10 mg daily -Continue to monitor volume status   #History of pulmonary embolism/DVT Adherent to warfarin. Respiratory status has improved w/ treatment for COPD. Low suspicion for PE at this time. -Monitor respiratory status.    #CKD IIIa GFR up to >60 today, stable over last 5 months.  -Continue to montior   #Hypokalemia Potassium of 2.8 today, down from 3.3 on admission. Magnesium normal. Will replete and continue home potassium supplements. -PO potassium 40 mEQ BID today -PO potassium 20 mEQ BID   #History of tobacco use disorder Has tried chantix in the past without relief. Can offer NRT at discharge.    #Hx of atrial fibrillation Hx of DCCV. Remains in normal sinus rhythm. -Telemetry   #Chronic pain Hx of chronic pain from diffuse arthritis. Follows with Dr. Dagoberto Ligas of PM&R.  -Continue home norco and gabapentin -Scheduled tylenol for back pain   #Depression -Continue home duloxetine   Diet: Normal VTE: None, holding warfarin with elevated INR IVF: None Code: Full   Prior to Admission Living Arrangement: Home  Anticipated Discharge Location: Home Barriers to Discharge: continued management Dispo: Anticipated discharge in approximately less than 2 day(s).    Signature: Marisa Cyphers, Medical Student   Please contact the on call pager after 5 pm and on weekends at [phone].  Attestation for Student  Documentation:  I personally was present and performed or re-performed the history, physical exam and medical decision-making activities of this service and have verified that the service and findings are accurately documented in the student's note.  Starlyn Skeans, MD 08/28/2022, 5:02 PM

## 2022-08-28 NOTE — TOC Progression Note (Signed)
Transition of Care [facility]) - Progression Note    Patient Details  Name: Kim Anthony [name]: [id number] Date of Birth: 1950/11/17  Transition of Care [facility]) CM/SW [place], RN Phone Number: 08/28/2022, 4:12 PM  Clinical Narrative:    Resources provided in the AVS to include Social Services and Financial assistance for social services and assistance with utilities as requested in [facility] consult.        Expected Discharge Plan and Services                                               Social Determinants of Health (SDOH) Interventions SDOH Screenings   Food Insecurity: No Food Insecurity (08/27/2022)  Housing: Low Risk  (08/27/2022)  Transportation Needs: No Transportation Needs (08/27/2022)  Utilities: Not At Risk (08/27/2022)  Recent Concern: Utilities - At Risk (07/03/2022)  Depression (PHQ2-9): Low Risk  (07/02/2022)  Financial Resource Strain: Medium Risk (02/27/2021)  Social Connections: Socially Isolated (11/28/2019)  Tobacco Use: High Risk (08/27/2022)    Readmission Risk Interventions    02/19/2022    3:36 PM  Readmission Risk Prevention Plan  Transportation Screening Complete  PCP or Specialist Appt within 3-5 Days Complete  HRI or [place] Complete  Social Work Consult for [place] Planning/Counseling Complete  Palliative Care Screening Not Applicable  Medication Review Press photographer) Complete

## 2022-08-28 NOTE — Progress Notes (Signed)
Patient ambulated in hallway on room air. Patient's SpO2 saturation dropped to 91% and quickly recovered once patient sat at bedside. No O2 supplementation required. Post vitals in flowsheet. No s/s of  respiratory distress.

## 2022-08-28 NOTE — TOC Benefit Eligibility Note (Signed)
Patient Teacher, English as a foreign language completed.    The patient is currently admitted and upon discharge could be taking Breztri Aerospher 160-9-4.8 mcg/act.  The current 30 day co-pay is $0.00.   The patient is currently admitted and upon discharge could be taking Trelegy Ellipta 100-62.5-25  mcg/act.  The current 30 day co-pay is $0.00.   The patient is insured through [place] Patient Advocate Specialist [place] Patient Advocate Team Direct Number: [phone]  Fax: [phone]

## 2022-08-28 NOTE — Hospital Course (Addendum)
Kim Anthony is a 72 y.o. person living with a history of suspected COPD/asthma, combined HF, tobacco use disorder who presented with dyspnea and a cough and admitted for suspected COPD vs asthma exacerbation.   #Acute hypoxic respiratory failure #COPD exacerbation Recent COVID-19 infection. Presented w/ worsening SOB and cough. Reports history of asthma vs COPD. PFTs ordered as outpatient but were never completed. Taking stioloto, albuterol, and nebulizer at home. COVID-19 positive here but likely from prior infection. Remains afebrile w/o leukocytosis. Low suspicion for pneumonia at this time. Not on oxygen at home but required oxygen on admission. Satting well on 2L today. Will attempt to wean O2 today.  Will continue w/ breathing treatments and steroids for COPD/asthma exacerbation. Will assess peak flow.  -Duonebs q4h -PO prednisone (day 2/4) -Brovana and incruse ellipta daily  -Albuterol PRN -Robitussin DM PRN -Flutter valve   #Supratheraputic INR #Hx of MVR MVR placed in 2001. Follows outpatient w/ Dr. Elie Confer in [facility] coumadin clinic for home warfarin. Reports compliance w/ warfarin. INR 4.4 on admission. No signs of bleeding.  Most recent heart failure note mentions SBE prophylaxis with her valve, however no abx on medication list***.   -Hold warfarin -Trend INR   #Chronic combined heart failure NYHA II Echo from 04/2022 w/ EF 35-40%, severe RV dysfunction, stable MVR, and severe TR. BNP minimally elevated at 154. Remains euvolemic on exam***.  -Continue torsemide 40 mg daily, spiro 25 mg daily, toprol XL 25 mg daily, and farxiga 10 mg daily -Continue to monitor volume status   #History of pulmonary embolism/DVT Adherent to warfarin. If no further improvement in breathing with treatment for COPD exacerbation could consider CTA chest, however, low suspicion for PE at this time.  -Monitor respiratory status.    #CKD IIIa GFR remains stable at *** today.  -Trend BMP    #Hypokalemia Potassium today ***.  -Continue home potassium supplements -Trend BMP   #History of tobacco use disorder Has tried chantix in the past without relief. Can offer NRT at discharge.    #Hx of atrial fibrillation Hx of DCCV. Remains in normal sinus rhythm. -Telemetry   #Chronic pain Hx of chronic pain from diffuse arthritis. Follows with Dr. Dagoberto Ligas of PM&R.  -Continue home norco and gabapentin   #Depression -Continue home duloxetine ---------------------- 3/8: Breathing has improved today, cough has improved, still productive of some sputum, reports cental chest soreness that worsens with coughing,   Start pain med, inhaled corticosteroid

## 2022-08-28 NOTE — Progress Notes (Signed)
[place] for Warfarin Indication: atrial fibrillation, pulmonary embolus, and MVR   Allergies  Allergen Reactions   Lisinopril Swelling   Acetaminophen-Codeine Itching   Propoxyphene Itching    Darvocet   Tape Rash    Plastic   Tramadol Itching, Nausea And Vomiting and Rash   Patient Measurements: Height: '5\' 5"'$  (165.1 cm) Weight: 74 kg (163 lb 2.3 oz) IBW/kg (Calculated) : 57  Vital Signs: Temp: 97.5 F (36.4 C) (03/08 0807) Temp Source: Oral (03/08 0807) BP: 133/81 (03/08 0807) Pulse Rate: 88 (03/08 0807)  Labs: Recent Labs    08/27/22 0630 08/27/22 1006 08/28/22 0608  HGB 13.1  --   --   HCT 40.4  --   --   PLT 495*  --   --   LABPROT 41.7*  --  37.6*  INR 4.4*  --  3.9*  CREATININE 1.07*  --  0.99  TROPONINIHS 10 13  --     Estimated Creatinine Clearance: 51.7 mL/min (by C-G formula based on SCr of 0.99 mg/dL).   Assessment: 7 YOF with medical history significant for afib/MVR/PE on warfarin PTA who is admitted for SOB. Pharmacy consulted to manage warfarin. LD 3/06.   PTA warfarin regimen from 2/07 [facility] telephone encounter:  7.'5mg'$  PO MWF; '5mg'$  all other days (has '5mg'$  tablets at home)  INR remains elevated 3.9. No signs of bleeding reported. Will continue to hold warfarin for now.   Goal of Therapy:  INR 2.5-3.5 Monitor platelets by anticoagulation protocol: Yes   Plan:  Hold warfarin x1 dose Check INR daily while on warfarin Continue to monitor H&H and platelets  Thank you for allowing pharmacy to be a part of this patient's care.  Ardyth Harps, PharmD Clinical Pharmacist

## 2022-08-29 DIAGNOSIS — F17210 Nicotine dependence, cigarettes, uncomplicated: Secondary | ICD-10-CM | POA: Diagnosis not present

## 2022-08-29 DIAGNOSIS — J441 Chronic obstructive pulmonary disease with (acute) exacerbation: Secondary | ICD-10-CM | POA: Diagnosis not present

## 2022-08-29 LAB — BASIC METABOLIC PANEL
Anion gap: 13 (ref 5–15)
BUN: 19 mg/dL (ref 8–23)
CO2: 22 mmol/L (ref 22–32)
Calcium: 9 mg/dL (ref 8.9–10.3)
Chloride: 103 mmol/L (ref 98–111)
Creatinine, Ser: 0.9 mg/dL (ref 0.44–1.00)
GFR, Estimated: >60 mL/min (ref >60)
Glucose, Bld: 91 mg/dL (ref 70–99)
Potassium: 4.1 mmol/L (ref 3.5–5.1)
Sodium: 138 mmol/L (ref 135–145)

## 2022-08-29 LAB — PROTIME-INR
INR: 2.5 — ABNORMAL HIGH (ref 0.8–1.2)
Prothrombin Time: 26.6 seconds — ABNORMAL HIGH (ref 11.4–15.2)

## 2022-08-29 MED ORDER — VARENICLINE TARTRATE 0.5 MG PO TABS: 0.5000 mg | ORAL_TABLET | Freq: Two times a day (BID) | ORAL | 0 refills | Status: DC

## 2022-08-29 MED ORDER — TRELEGY ELLIPTA 100-62.5-25 MCG/ACT IN AEPB: 1.0000 | INHALATION_SPRAY | Freq: Every day | RESPIRATORY_TRACT | 0 refills | Status: DC

## 2022-08-29 MED ORDER — WARFARIN SODIUM 7.5 MG PO TABS
7.5000 mg | ORAL_TABLET | Freq: Once | ORAL | Status: DC
  Filled 2022-08-29: qty 1

## 2022-08-29 MED ORDER — PREDNISONE 20 MG PO TABS: 40.0000 mg | ORAL_TABLET | Freq: Every day | ORAL | 0 refills | Status: AC

## 2022-08-29 MED ORDER — AZITHROMYCIN 500 MG PO TABS: 500.0000 mg | ORAL_TABLET | Freq: Once | ORAL | 0 refills | Status: AC

## 2022-08-29 NOTE — Discharge Summary (Addendum)
Name: KASON CARN [name]: [id number] DOB: [birth year] 72 y.o. PCP: Iona Coach, MD  Date of Admission: 08/27/2022  5:50 AM Date of Discharge: 08/29/22 Attending Physician: Dr. Heber [place]  Discharge Diagnosis: Principal Problem:   COPD exacerbation ([place])  Acute hypoxic respiratory failure Hx of MVR on anticoagulation  Discharge Medications: Allergies as of 08/29/2022       Reactions   Lisinopril Swelling   Acetaminophen-codeine Itching   Propoxyphene Itching   Darvocet   Tape Rash   Plastic   Tramadol Itching, Nausea And Vomiting, Rash        Medication List     STOP taking these medications    Stiolto Respimat 2.5-2.5 MCG/ACT Aers Generic drug: Tiotropium Bromide-Olodaterol       TAKE these medications    albuterol 108 (90 Base) MCG/ACT inhaler Commonly known as: VENTOLIN HFA Inhale 2 puffs into the lungs every 6 (six) hours as needed for wheezing or shortness of breath.   amiodarone 200 MG tablet Commonly known as: PACERONE Take 1 tablet (200 mg total) by mouth daily.   azithromycin 500 MG tablet Commonly known as: Zithromax Take 1 tablet (500 mg total) by mouth once for 1 dose. Start taking on: August 30, 2022   BreatheRite Coll Spacer Adult Misc 1 applicator by Does not apply route daily.   diphenhydrAMINE 25 MG tablet Commonly known as: BENADRYL Take 75 mg by mouth daily as needed for allergies.   DULoxetine 20 MG capsule Commonly known as: CYMBALTA Take 20 mg by mouth at bedtime.   famotidine 20 MG tablet Commonly known as: PEPCID Take 1 tablet (20 mg total) by mouth 2 (two) times daily.   Farxiga 10 MG Tabs tablet Generic drug: dapagliflozin propanediol Take 1 tablet by mouth once daily   fluticasone 50 MCG/ACT nasal spray Commonly known as: Flonase Place 1 spray into both nostrils daily as needed for allergies.   gabapentin 600 MG tablet Commonly known as: NEURONTIN TAKE 1 & 1/2 (ONE & ONE-HALF) TABLETS BY MOUTH THREE TIMES DAILY What  changed:  how much to take how to take this when to take this additional instructions   HYDROcodone-acetaminophen 5-325 MG tablet Commonly known as: Norco Take 1 tablet by mouth every 4 (four) hours as needed for moderate pain.   losartan 25 MG tablet Commonly known as: COZAAR Take 0.5 tablets (12.5 mg total) by mouth daily.   metoprolol succinate 25 MG 24 hr tablet Commonly known as: TOPROL-XL Take 1 tablet (25 mg total) by mouth daily.   pantoprazole 40 MG tablet Commonly known as: Protonix Take 1 tablet (40 mg total) by mouth daily.   potassium chloride SA 20 MEQ tablet Commonly known as: KLOR-CON M Take 2 tablets (40 mEq total) by mouth 2 (two) times daily. What changed: how much to take   predniSONE 20 MG tablet Commonly known as: DELTASONE Take 2 tablets (40 mg total) by mouth daily with breakfast for 2 days. Start taking on: August 30, 2022   simvastatin 20 MG tablet Commonly known as: ZOCOR Take 1 tablet (20 mg total) by mouth daily.   spironolactone 25 MG tablet Commonly known as: ALDACTONE Take 1 tablet by mouth once daily   torsemide 20 MG tablet Commonly known as: DEMADEX Take 2 tablets (40 mg total) by mouth daily.   Trelegy Ellipta 100-62.5-25 MCG/ACT Aepb Generic drug: Fluticasone-Umeclidin-Vilant Inhale 1 Inhalation into the lungs daily.   varenicline 0.5 MG tablet Commonly known as: Chantix Take 1 tablet (0.5 mg total)  by mouth 2 (two) times daily.   warfarin 5 MG tablet Commonly known as: Coumadin Take as directed. If you are unsure how to take this medication, talk to your nurse or doctor. Original instructions: Give one (1) tablet on Sundays and Wednesdays. On all other days, give only one-half (1/2) tablet        Disposition and follow-up:   Ms.Jareli B Pollio was discharged from [facility] in Stable condition.  At the hospital follow up visit please address:  1.  Follow-up:  A. COPD exacerbation for improvement in  symptoms   B. Efficacy/affordability of triple therapy inhaler   C. Smoking cessation follow-up - sent with chantix rx   D. Consider SBE prophylaxis    E. PT-INR levels for warfarin s/p MVR - restarting warfarin tomorrow   F. Hypokalemia   2.  Labs / imaging needed at time of follow-up: PT-INR, K, BMP  3.  Pending labs/ test needing follow-up: n/a  4.  Medication Changes  Started: Trelegy Ellipta 100-62.5-25 mcg/act, Prednisone 40 mg daily, last dose on 08/31/22, Azithromycin '500mg'$  daily for 3 days End Date: 08/30/22  Stopped: Stiolto 2.5-2.77mg/act   Follow-up Appointments: Knee MRI with ortho (patient to schedule this) 09/03/22 [facility] 10:[facility] 9[facility] problem list:  Kim LAMBERSONis a 72y.o. person living with a history of suspected COPD/asthma, combined HF, tobacco use disorder who presented with dyspnea and a cough and admitted for suspected COPD vs asthma exacerbation.   #Acute hypoxic respiratory failure #COPD exacerbation Patient presented to the hospital with complaints of shortness of breath.  She was diagnosed with COPD exacerbation and started on treatment.  Triple therapy given and supplemental oxygen was provided as necessary.  Improved symptomatically.  Ambulated in the hall with O2 sats dropping no lower than 91% on room air.  Felt stable for discharge.  She was switched over to Trelegy on discharge.    #Supratheraputic INR #Hx of MVR Warfarin was held on admission due to supra therapeutic INR. This was monitored daily. Improved to 2.5 day of discharge. She received a dose of warfarin for the day prior to DC.    Discharge Subjective: Patient feels well enough to discharge today. She states that her breathing has  improved and she is willing to try Chantix for tobacco cessation again.  Discharge Exam:   BP (!) 141/81 (BP Location: Right Arm)   Pulse 72   Temp 98.3 F (36.8 C) (Oral)   Resp 20   Ht '5\' 5"'$  (1.651 m)   Wt 74 kg   SpO2 97%   BMI 27.15 kg/m  Constitutional: well-appearing female sitting in bed, in no acute distress HENT: normocephalic atraumatic, mucous membranes moist Eyes: conjunctiva non-erythematous Neck: supple Cardiovascular: regular rate and rhythm, no m/r/g Pulmonary/Chest: normal work of breathing on room air, lungs clear to auscultation bilaterally Abdominal: soft, non-tender, non-distended MSK: normal bulk and tone Neurological: alert & oriented x 3, 5/5 strength in bilateral upper and lower extremities, normal gait Skin: warm and dry Psych: normal mood and affect    Pertinent Labs, Studies, and Procedures:     Latest Ref Rng & Units 08/27/2022    6:30 AM 06/11/2022   10:19 AM 04/01/2022   11:23 AM  CBC  WBC 4.0 -  10.5 K/uL 8.7  7.8  6.3   Hemoglobin 12.0 - 15.0 g/dL 13.1  14.4  14.2   Hematocrit 36.0 - 46.0 % 40.4  46.0  44.9   Platelets 150 - 400 K/uL 495  423  511        Latest Ref Rng & Units 08/29/2022    2:24 AM 08/28/2022    6:03 PM 08/28/2022    6:08 AM  CMP  Glucose 70 - 99 mg/dL 91   90   BUN 8 - 23 mg/dL 19   20   Creatinine 0.44 - 1.00 mg/dL 0.90   0.99   Sodium 135 - 145 mmol/L 138   139   Potassium 3.5 - 5.1 mmol/L 4.1  4.0  2.8   Chloride 98 - 111 mmol/L 103   103   CO2 22 - 32 mmol/L 22   26   Calcium 8.9 - 10.3 mg/dL 9.0   8.1     DG Chest Port 1 View  Result Date: 08/27/2022 CLINICAL DATA:  Shortness of breath workup. EXAM: PORTABLE CHEST 1 VIEW COMPARISON:  Portable chest 03/07/2022, chest CT 03/09/2022. FINDINGS: The heart is enlarged. There is a prosthetic mitral valve and intact sternotomy sutures. There are no findings of acute CHF. The lungs emphysematous with subpleural interstitial fibrosis and chronic left apical and lingular  radiation fibrosis. No focal pneumonia is evident and no pleural effusion. The mediastinum is normally outlined. Aortic atherosclerosis. Left axillary surgical clips are again noted. Osteopenia and degenerative change thoracic spine. IMPRESSION: 1. No acute radiographic findings. 2. Emphysema and chronic left apical and lingular radiation fibrosis. Background subpleural interstitial fibrosis 3. Cardiomegaly without evidence of acute CHF or pneumonia. 4. Aortic atherosclerosis. Electronically Signed   By: Telford Nab M.D.   On: 08/27/2022 06:51     Discharge Instructions:   Signed: Delene Ruffini, MD 08/29/2022, 11:56 AM   Pager: [phone]

## 2022-08-29 NOTE — Care Management (Signed)
[place] called and stated possible interaction with increased bleeding times with zithromax and coumadin, increases bleeding times of coumadin. Discussed with providers. Ok to keep zithromax, Paediatric nurse pharmacy made aware.

## 2022-08-29 NOTE — Discharge Instructions (Signed)
We are glad you are feeling better Kim Anthony. It was a pleasure to take care of you.   We started you on a new inhaler called Trelegy that has 3 different medicines in it to help you breathe better. This will replace your Stiolto inhaler.  You can still use your albuterol inhaler if you feel short of breath.  You will be taking your antibiotic azithromycin to help with inflammation in your lungs. You have one dose left, please take this tomorrow 3/10.  You will be taking your steroid medication prednisone to help with inflammation in your lungs. You will take 2 tablets at breakfast. Please finish your last dose on 3/12.   Please follow up with our clinic so we can see how you're doing.

## 2022-08-29 NOTE — Progress Notes (Signed)
[place] for Warfarin Indication: atrial fibrillation, pulmonary embolus, and MVR   Allergies  Allergen Reactions   Lisinopril Swelling   Acetaminophen-Codeine Itching   Propoxyphene Itching    Darvocet   Tape Rash    Plastic   Tramadol Itching, Nausea And Vomiting and Rash   Patient Measurements: Height: '5\' 5"'$  (165.1 cm) Weight: 74 kg (163 lb 2.3 oz) IBW/kg (Calculated) : 57  Vital Signs: Temp: 97.8 F (36.6 C) (03/09 0511) Temp Source: Oral (03/09 0511) BP: 145/86 (03/09 0511) Pulse Rate: 70 (03/09 0511)  Labs: Recent Labs    08/27/22 0630 08/27/22 1006 08/28/22 0608 08/29/22 0224  HGB 13.1  --   --   --   HCT 40.4  --   --   --   PLT 495*  --   --   --   LABPROT 41.7*  --  37.6* 26.6*  INR 4.4*  --  3.9* 2.5*  CREATININE 1.07*  --  0.99 0.90  TROPONINIHS 10 13  --   --      Estimated Creatinine Clearance: 56.9 mL/min (by C-G formula based on SCr of 0.9 mg/dL).   Assessment: 57 YOF with medical history significant for afib/MVR/PE on warfarin PTA who is admitted for SOB. Pharmacy consulted to manage warfarin. LD 3/06.   PTA warfarin regimen from 2/07 [facility] telephone encounter:  7.'5mg'$  PO MWF; '5mg'$  all other days (has '5mg'$  tablets at home)  INR with significant drop 3.9 > 2.5 after doses held x 2.  Noted potential drug interaction with Azithromycin added for COPD exacerbation.  Could increase INR, however, will order home dose tonight given rate of INR decline.  No signs of bleeding reported.   Goal of Therapy:  INR 2.5-3.5 Monitor platelets by anticoagulation protocol: Yes   Plan:  Warfarin 7.'5mg'$  PO x 1 tonight Check INR daily while on warfarin Continue to monitor H&H and platelets  [employer], Pharm.D., BCPS Clinical Pharmacist  **Pharmacist phone directory can be found on [email or web address] listed under [place].  08/29/2022 8:50 AM

## 2022-08-31 ENCOUNTER — Telehealth: Payer: Self-pay

## 2022-08-31 NOTE — Telephone Encounter (Signed)
Incoming fax from pharmacy  Medication requested:Azithromycin  Message to prescriber: patient on warfarin, bleeding interaction please advise.thx [facility].

## 2022-09-01 ENCOUNTER — Telehealth: Payer: Self-pay

## 2022-09-01 NOTE — Transitions of Care (Post Inpatient/ED Visit) (Signed)
   09/01/2022  Name: Kim Anthony [name]: [id number] DOB: Nov 25, 1950  Today's TOC FU Call Status: Today's TOC FU Call Status:: Unsuccessul Call (1st Attempt)  Attempted to reach the patient regarding the most recent Inpatient/ED visit.  Follow Up Plan: Additional outreach attempts will be made to reach the patient to complete the Transitions of Care (Post Inpatient/ED visit) call.   [name], RN, BSN, CCM Care Management Coordinator [place] Phone: [phone]: [phone]

## 2022-09-01 NOTE — Progress Notes (Signed)
Internal Medicine Clinic Attending  Case discussed with Dr. Katsadouros  At the time of the visit.  We reviewed the resident's history and exam and pertinent patient test results.  I agree with the assessment, diagnosis, and plan of care documented in the resident's note.  

## 2022-09-02 ENCOUNTER — Telehealth: Payer: Self-pay

## 2022-09-02 MED ORDER — HYDROCODONE-ACETAMINOPHEN 5-325 MG PO TABS: 1.0000 | ORAL_TABLET | ORAL | 0 refills | Status: DC | PRN

## 2022-09-02 NOTE — Telephone Encounter (Signed)
Patient requesting refill on Hydrocodone, patient said she was out and had to take extra because she recently got over covid and she stated she was in so much pain  Filled  Written  ID  Drug  QTY  Days  Prescriber  RX #  Dispenser  Refill  Daily Dose*  Pymt Type  PMP  08/19/2022 09/29/2021 2  Gabapentin 600 Mg Tablet 150.00 34 Me Lov [id number] [facility] ([facility]92) 5/5 2.96 LME Medicare [place] 08/08/2022 08/06/2022 2  Hydrocodone-Acetamin 5-325 Mg 150.00 25 Me Lov [id number] [facility] (7792) 0/0 30.00 MME Medicare [place]

## 2022-09-02 NOTE — Telephone Encounter (Signed)
Patient notified

## 2022-09-02 NOTE — Telephone Encounter (Signed)
Without calling WHY she's increasing her dose, I am not allowed to refill meds early unless we discuss it first - per opiate agreement, she needs ot follow agreement, so I cannot fill early.

## 2022-09-02 NOTE — Transitions of Care (Post Inpatient/ED Visit) (Signed)
   09/02/2022  Name: Kim Anthony [name]: [id number] DOB: 11-16-1950  Today's TOC FU Call Status: Today's TOC FU Call Status:: Unsuccessful Call (2nd Attempt) Unsuccessful Call (2nd Attempt) Date: 09/02/22  Attempted to reach the patient regarding the most recent Inpatient/ED visit.  Follow Up Plan: Additional outreach attempts will be made to reach the patient to complete the Transitions of Care (Post Inpatient/ED visit) call.   [name], RN, BSN, CCM Care Management Coordinator [place] Phone: [phone]: [phone]

## 2022-09-03 ENCOUNTER — Telehealth: Payer: Self-pay

## 2022-09-03 ENCOUNTER — Ambulatory Visit: Payer: Self-pay

## 2022-09-03 NOTE — Patient Instructions (Signed)
Visit Information  Thank you for taking time to visit with me today. Please don't hesitate to contact me if I can be of assistance to you.   Following are the goals we discussed today:   Goals Addressed             This Visit's Progress    Monitor and Manage my CHF and breathing       Patient Goals/Self Care Activities: -Patient/Caregiver will self-administer medications as prescribed as evidenced by self-report/primary caregiver report  -Patient/Caregiver will attend all scheduled provider appointments as evidenced by clinician review of documented attendance to scheduled appointments and patient/caregiver report -Patient/Caregiver will call pharmacy for medication refills as evidenced by patient report and review of pharmacy fill history as appropriate -Patient/Caregiver will call provider office for new concerns or questions as evidenced by review of documented incoming telephone call notes and patient report -Patient/Caregiver verbalizes understanding of plan  -Weigh daily and record (notify MD with 3 lb weight gain over night or 5 lb in a week) -Follow CHF Action Plan -Continue with smoking cessation -continue using your inhalers        Our next appointment is by telephone on 10/07/22 at 1130 am  Please call the care guide team at [phone] if you need to cancel or reschedule your appointment.   If you are experiencing a Mental Health or [place] or need someone to talk to, please call 1-800-273-TALK (toll free, 24 hour hotline)  Patient verbalizes understanding of instructions and care plan provided today and agrees to view in [place]. Active MyChart status and patient understanding of how to access instructions and care plan via MyChart confirmed with patient.      Lazaro Arms RN, BSN, [facility]   Phone: [phone]

## 2022-09-03 NOTE — Transitions of Care (Post Inpatient/ED Visit) (Signed)
   09/03/2022  Name: Kim Anthony [name]: [id number] DOB: Oct 15, 1950  Today's TOC FU Call Status: Today's TOC FU Call Status:: Successful TOC FU Call Competed TOC FU Call Complete Date: 09/03/22  Transition Care Management Follow-up Telephone Call Date of Discharge: 08/29/22 [facility]) Type of Discharge: Inpatient Admission Primary Inpatient Discharge Diagnosis:: Chronic Obstructive Pulmonary Disease How have you been since you were released from the hospital?: Better Any questions or concerns?: No  Items Reviewed: Did you receive and understand the discharge instructions provided?: Yes Medications obtained and verified?: Yes (Medications Reviewed) Any new allergies since your discharge?: No Dietary orders reviewed?: No Do you have support at home?: Yes People in Home: child(ren), adult Name of Support/Comfort Primary Source: [place] and Equipment/Supplies: [place] Ordered?: No Any new equipment or medical supplies ordered?: No  Functional Questionnaire: Do you need assistance with bathing/showering or dressing?: No Do you need assistance with meal preparation?: No Do you need assistance with eating?: No Do you have difficulty maintaining continence: No Do you need assistance with getting out of bed/getting out of a chair/moving?: No Do you have difficulty managing or taking your medications?: No  Folllow up appointments reviewed: PCP Follow-up appointment confirmed?: Yes Date of PCP follow-up appointment?: 09/14/22 Follow-up Provider: Dr. Lisabeth Devoid [facility] Follow-up appointment confirmed?: Yes Date of Specialist follow-up appointment?: 10/02/22 Follow-Up Specialty Provider:: Dr. Dagoberto Ligas Do you need transportation to your follow-up appointment?: No Do you understand care options if your condition(s) worsen?: Yes-patient verbalized understanding  SDOH Interventions Today    Flowsheet Row Most Recent Value   SDOH Interventions   Food Insecurity Interventions Intervention Not Indicated  Housing Interventions Intervention Not Indicated      [name], RN, BSN, CCM Care Management Coordinator [facility] Phone: [phone]: [phone]

## 2022-09-03 NOTE — Patient Outreach (Signed)
  Care Coordination   Follow Up Visit Note   09/03/2022 Name: Kim Anthony [name]: [id number] DOB: 03-15-1951  Kim Anthony is a 72 y.o. year old female who sees Iona Coach, MD for primary care. I spoke with  Shaune Pascal by phone today.  What matters to the patients health and wellness today?  Mr. Nogales had recently been admitted to the hospital due to shortness of breath. She informed that she had contracted COVID-19 and was feeling really unwell. Although her cough has now subsided, she still complains of sore ribs. She has completed the prescribed course of antibiotics and prednisone. She is also attempting to quit smoking and has resumed taking her coumadin. We went through her upcoming appointments together, and she confirmed that she intends to attend them all.  She denie ny chest pain or shortness of breath but has had some swelling swelling but it has gone down while she has been resting.     Goals Addressed             This Visit's Progress    Monitor and Manage my CHF and breathing       Patient Goals/Self Care Activities: -Patient/Caregiver will self-administer medications as prescribed as evidenced by self-report/primary caregiver report  -Patient/Caregiver will attend all scheduled provider appointments as evidenced by clinician review of documented attendance to scheduled appointments and patient/caregiver report -Patient/Caregiver will call pharmacy for medication refills as evidenced by patient report and review of pharmacy fill history as appropriate -Patient/Caregiver will call provider office for new concerns or questions as evidenced by review of documented incoming telephone call notes and patient report -Patient/Caregiver verbalizes understanding of plan  -Weigh daily and record (notify MD with 3 lb weight gain over night or 5 lb in a week) -Follow CHF Action Plan -Continue with smoking cessation -continue using your inhalers        SDOH assessments and  interventions completed:  No     Care Coordination Interventions:  Yes, provided   Interventions Today    Flowsheet Row Most Recent Value  Chronic Disease   Chronic disease during today's visit Chronic Kidney Disease/End Stage Renal Disease (ESRD)  [Breathing issues]  General Interventions   General Interventions Discussed/Reviewed General Interventions Discussed, General Interventions Reviewed  Education Interventions   Education Provided Provided Education  Provided Verbal Education On Medication        Follow up plan: Follow up call scheduled for 10/07/22  1130    Encounter Outcome:  Pt. Visit Completed   Lazaro Arms RN, BSN, [facility]   Phone: [phone]

## 2022-09-04 ENCOUNTER — Ambulatory Visit: Payer: Medicare Other | Admitting: Obstetrics & Gynecology

## 2022-09-07 ENCOUNTER — Other Ambulatory Visit: Payer: Self-pay | Admitting: Student

## 2022-09-07 DIAGNOSIS — Z952 Presence of prosthetic heart valve: Secondary | ICD-10-CM

## 2022-09-07 MED ORDER — WARFARIN SODIUM 5 MG PO TABS: ORAL_TABLET | ORAL | 2 refills | Status: DC

## 2022-09-07 NOTE — Telephone Encounter (Signed)
Patient had 1 day of azithromycin to complete 5-day course with COPD exacerbation.  Is now no longer needing azithromycin and breathing feels better.  Denies any signs of bleeding.  States she is almost out of her warfarin.  I will refill this.  She does need to have her INR checked asked her to set this up with Dr. Johney Maine which she will have her daughter do.

## 2022-09-09 ENCOUNTER — Telehealth: Payer: Self-pay | Admitting: Pharmacist

## 2022-09-09 NOTE — Telephone Encounter (Signed)
Received call from the patient's daughter, [name] at [phone], reporting PST FS POC INR = 3.0 (Target Range 2.5 - 3.5). Patient/patient's daughter state that the she/her mom has been taking one of the 5 milligram strength, peach-colored warfarin tablets by mouth, once-daily. I reviewed her admission and her discharge summary which reads:  "Take as directed. If you are unsure how to take this medication, talk to your nurse or doctor. Original instructions: Give one (1) tablet on Sundays and Wednesdays. On all other days, give only one-half (1/2) tablet."  Again, patient/patient's daughter state that the she/her mom has been taking one of the 5 milligram strength, peach-colored warfarin tablets by mouth, once-daily.   Daughter who works late/arrives to her mom' home late indicates that her warfarin dose is empty--last dose being administered yesterday 19-MAR-24, NONE today 20-MAR-24.  I will discuss with Dr. Sander Radon tomorrow morning to seek clartiy and update the patient/patient's daughter.  Jorene Guest, PharmD, CPP

## 2022-09-10 ENCOUNTER — Other Ambulatory Visit: Payer: Self-pay | Admitting: Pharmacist

## 2022-09-10 ENCOUNTER — Telehealth: Payer: Self-pay | Admitting: *Deleted

## 2022-09-10 DIAGNOSIS — Z952 Presence of prosthetic heart valve: Secondary | ICD-10-CM

## 2022-09-10 MED ORDER — WARFARIN SODIUM 5 MG PO TABS: 5.0000 mg | ORAL_TABLET | Freq: Every day | ORAL | 2 refills | Status: DC

## 2022-09-10 NOTE — Telephone Encounter (Signed)
Clarified dosing: Take one of the 5 milligram peach-colored warfarin tablets by mouth, once-daily. Sent refill authorization to the patient's designated pharmacy.

## 2022-09-10 NOTE — Telephone Encounter (Signed)
Received fax from [facility] with INR results from 12/01/21 to 09/09/22. Last INR 3.0 on 09/09/22. Dr. Elie Confer is aware of yesterday's result. Results placed in Dr. Gladstone Pih office.

## 2022-09-11 ENCOUNTER — Ambulatory Visit: Payer: 59 | Attending: Cardiovascular Disease

## 2022-09-11 DIAGNOSIS — Z01810 Encounter for preprocedural cardiovascular examination: Secondary | ICD-10-CM | POA: Diagnosis not present

## 2022-09-11 NOTE — Progress Notes (Signed)
Virtual Visit via Telephone Note   Because of TIMEKO TIDRICK co-morbid illnesses, she is at least at moderate risk for complications without adequate follow up.  This format is felt to be most appropriate for this patient at this time.  The patient did not have access to video technology/had technical difficulties with video requiring transitioning to audio format only (telephone).  All issues noted in this document were discussed and addressed.  No physical exam could be performed with this format.  Please refer to the patient's chart for her consent to telehealth for [facility]  Evaluation Performed:  Preoperative cardiovascular risk assessment _____________   Date:  09/11/2022   Patient ID:  Kim Anthony, DOB 1950/11/21, [name] [id number] Patient Location:  Home Provider location:   Office  Primary Care Provider:  Iona Coach, MD Primary Cardiologist:  Glori Bickers, MD  Chief Complaint / Patient Profile   72 y.o. y/o female with a h/o history of mechanical MVR in 2001 in [place], [place], CAD, history of breast cancer status post left mastectomy and radiation/chemo, tobacco use, COPD, prior PE/DVT, HTN, undergoing workup for possible inflammatory arthritis with rheumatology, and systolic CHF who is pending left total knee replacement and presents today for telephonic preoperative cardiovascular risk assessment.  History of Present Illness    Kim Anthony is a 72 y.o. female who presents via audio/video conferencing for a telehealth visit today.  Pt was last seen in cardiology clinic on 06/11/2022 by Dr. Haroldine Laws.  At that time Kim Anthony was doing well.  The patient is now pending procedure as outlined above. Since her last visit, she tells me that she is doing pretty well.  She was recently in the hospital on 3/7 for a COVID infection.  She was short of breath at this time.  She was treated with antibiotics and that has gone away.  No more shortness of breath.  No chest  pains.  She does occasionally have some swelling in her legs but mostly due to her knees.  She was getting injections but she is having to hold off on injections due to upcoming surgery.  Pt takes coumadin for history of PE/DVT which is managed by PCP/Dr. Elie Confer. Will defer to them on coumadin hold and decisions regarding IVC filter.   Past Medical History    Past Medical History:  Diagnosis Date   Arthritis    Asthma    Breast cancer ([place]) 2001   Left Breast Cancer   CHF (congestive heart failure) (HCC)    COPD (chronic obstructive pulmonary disease) ([place])    Coronary artery disease    GERD (gastroesophageal reflux disease)    History of mitral valve replacement with mechanical valve    HLD (hyperlipidemia)    Hypertension    Pulmonary embolism ([place]) 2021   Past Surgical History:  Procedure Laterality Date   APPLICATION OF A-CELL OF CHEST/ABDOMEN Left 01/27/2019   Procedure: APPLICATION OF A-CELL OF CHEST;  Surgeon: Wallace Going, DO;  Location: [place];  Service: Plastics;  Laterality: Left;   APPLICATION OF WOUND VAC N/A 01/27/2019   Procedure: APPLICATION OF WOUND VAC;  Surgeon: Wallace Going, DO;  Location: [facility];  Service: Plastics;  Laterality: N/A;   BREAST SURGERY Left 2001   for CA   CARDIOVERSION N/A 02/26/2022   Procedure: CARDIOVERSION;  Surgeon: Donato Heinz, MD;  Location: [facility];  Service: Cardiovascular;  Laterality: N/A;   COLONOSCOPY WITH PROPOFOL N/A 06/07/2018  Procedure: COLONOSCOPY WITH PROPOFOL;  Surgeon: Wilford Corner, MD;  Location: [facility];  Service: Endoscopy;  Laterality: N/A;   DEBRIDEMENT AND CLOSURE WOUND Left 12/28/2018   Procedure: Debridement And Closure Wound;  Surgeon: Coralie Keens, MD;  Location: [facility];  Service: General;  Laterality: Left;   INCISION AND DRAINAGE OF WOUND Left 01/27/2019   Procedure: IRRIGATION AND DEBRIDEMENT OF CHEST WALL;  Surgeon: Wallace Going, DO;  Location: [facility];   Service: Plastics;  Laterality: Left;   KNEE ARTHROSCOPY WITH MEDIAL MENISECTOMY Left 08/06/2021   Procedure: KNEE ARTHROSCOPY WITH PARTIAL MEDIAL MENISECTOMY, DEBRIDEMENT;  Surgeon: Susa Day, MD;  Location: [facility];  Service: Orthopedics;  Laterality: Left;  1 HR   LATISSIMUS FLAP TO BREAST Left 01/18/2019   Procedure: LEFT LATISSIMUS FLAP TO BREAST;  Surgeon: Wallace Going, DO;  Location: [facility];  Service: Plastics;  Laterality: Left;   MASTECTOMY Left    MITRAL VALVE REPLACEMENT     mechanical   POLYPECTOMY  06/07/2018   Procedure: POLYPECTOMY;  Surgeon: Wilford Corner, MD;  [facility]: [facility];  Service: Endoscopy;;   RIGHT/LEFT HEART CATH AND CORONARY ANGIOGRAPHY N/A 01/09/2021   Procedure: RIGHT/LEFT HEART CATH AND CORONARY ANGIOGRAPHY;  Surgeon: Jolaine Artist, MD;  Location: [facility];  Service: Cardiovascular;  Laterality: N/A;   WOUND DEBRIDEMENT Left 12/28/2018   Procedure: EXCISION OF LEFT CHRONIC CHEST WALL WOUND;  Surgeon: Coralie Keens, MD;  Location: [place];  Service: General;  Laterality: Left;    Allergies  Allergies  Allergen Reactions   Lisinopril Swelling   Acetaminophen-Codeine Itching   Propoxyphene Itching    Darvocet   Tape Rash    Plastic   Tramadol Itching, Nausea And Vomiting and Rash    Home Medications    Prior to Admission medications   Medication Sig Start Date End Date Taking? Authorizing Provider  albuterol (VENTOLIN HFA) 108 (90 Base) MCG/ACT inhaler Inhale 2 puffs into the lungs every 6 (six) hours as needed for wheezing or shortness of breath. 07/02/22   Rick Duff, MD  amiodarone (PACERONE) 200 MG tablet Take 1 tablet (200 mg total) by mouth daily. Patient not taking: Reported on 08/27/2022 03/13/22   Rafael Bihari, FNP  diphenhydrAMINE (BENADRYL) 25 MG tablet Take 75 mg by mouth daily as needed for allergies.    [provider]  DULoxetine (CYMBALTA) 20 MG capsule Take 20 mg by mouth at  bedtime.    [provider]  famotidine (PEPCID) 20 MG tablet Take 1 tablet (20 mg total) by mouth 2 (two) times daily. 06/03/22   Iona Coach, MD  FARXIGA 10 MG TABS tablet Take 1 tablet by mouth once daily 02/09/22   Idamae Schuller, MD  fluticasone [facility]) 50 MCG/ACT nasal spray Place 1 spray into both nostrils daily as needed for allergies. 03/02/22 03/02/23  Nani Gasser, MD  Fluticasone-Umeclidin-Vilant (TRELEGY ELLIPTA) 100-62.5-25 MCG/ACT AEPB Inhale 1 Inhalation into the lungs daily. 08/29/22   Delene Ruffini, MD  gabapentin (NEURONTIN) 600 MG tablet TAKE 1 & 1/2 (ONE & ONE-HALF) TABLETS BY MOUTH THREE TIMES DAILY Patient taking differently: Take 900 mg by mouth 3 (three) times daily. 07/27/22   Iona Coach, MD  HYDROcodone-acetaminophen (NORCO) 5-325 MG tablet Take 1 tablet by mouth every 4 (four) hours as needed for moderate pain. 09/02/22   [place], Jinny [place], MD  losartan (COZAAR) 25 MG tablet Take 0.5 tablets (12.5 mg total) by mouth daily. 04/10/22 01/05/23  Rafael Bihari, FNP  metoprolol succinate (  TOPROL-XL) 25 MG 24 hr tablet Take 1 tablet (25 mg total) by mouth daily. 06/03/22   Iona Coach, MD  pantoprazole (PROTONIX) 40 MG tablet Take 1 tablet (40 mg total) by mouth daily. 08/19/22   Katsadouros, Vasilios, MD  potassium chloride SA (KLOR-CON M) 20 MEQ tablet Take 2 tablets (40 mEq total) by mouth 2 (two) times daily. Patient taking differently: Take 20 mEq by mouth 2 (two) times daily. 05/13/22   [place], Maricela Bo, FNP  simvastatin (ZOCOR) 20 MG tablet Take 1 tablet (20 mg total) by mouth daily. 03/02/22   Nani Gasser, MD  Spacer/Aero-Holding Chambers (BREATHERITE COLL SPACER ADULT) MISC 1 applicator by Does not apply route daily. 01/11/20   [place], Nila Nephew D, DO  spironolactone (ALDACTONE) 25 MG tablet Take 1 tablet by mouth once daily Patient not taking: Reported on 08/27/2022 11/19/21   Bensimhon, Shaune Pascal, MD  torsemide (DEMADEX) 20 MG tablet Take 2 tablets  (40 mg total) by mouth daily. 05/13/22   [place], Maricela Bo, FNP  varenicline (CHANTIX) 0.5 MG tablet Take 1 tablet (0.5 mg total) by mouth 2 (two) times daily. 08/29/22   Delene Ruffini, MD  warfarin (COUMADIN) 5 MG tablet Take 1 tablet (5 mg total) by mouth daily at 4 PM. 09/10/22   Pennie Banter, RPH-CPP    Physical Exam    Vital Signs:  NIKISHA MESSMER does not have vital signs available for review today.  Given telephonic nature of communication, physical exam is limited. AAOx3. NAD. Normal affect.  Speech and respirations are unlabored.  Accessory Clinical Findings    None  Assessment & Plan    1.  Preoperative Cardiovascular Risk Assessment:  Ms. Oriordan perioperative risk of a major cardiac event is 6.6% according to the Revised Cardiac Risk Index (RCRI).  Therefore, she is at high risk for perioperative complications.   Her functional capacity is good at 5.07 METs according to the Duke Activity Status Index (DASI). Recommendations: According to ACC/AHA guidelines, no further cardiovascular testing needed.  The patient may proceed to surgery at acceptable risk.     The patient was advised that if she develops new symptoms prior to surgery to contact our office to arrange for a follow-up visit, and she verbalized understanding.   A copy of this note will be routed to requesting surgeon.  Time:   Today, I have spent 9 minutes with the patient with telehealth technology discussing medical history, symptoms, and management plan.     Elgie Collard, PA-C  09/11/2022, 7:46 AM

## 2022-09-14 ENCOUNTER — Encounter: Payer: 59 | Admitting: Internal Medicine

## 2022-09-14 NOTE — Progress Notes (Deleted)
   CC: ***  HPI:[name] is a 72 y.o. female who presents for evaluation of ***. Please see individual problem based A/P for details.  COPD exacerbation - discharged with triple therapy Smoking cessation sent with Chantix Rx  INR being managed by Dr. Elie Anthony   Depression, PHQ-9: Based on the patients  [place] Office Visit from 07/02/2022 in [place]  PHQ-9 Total Score 1      score we have ***.  Past Medical History:  Diagnosis Date   Arthritis    Asthma    Breast cancer ([place]) 2001   Left Breast Cancer   CHF (congestive heart failure) (HCC)    COPD (chronic obstructive pulmonary disease) (HCC)    Coronary artery disease    GERD (gastroesophageal reflux disease)    History of mitral valve replacement with mechanical valve    HLD (hyperlipidemia)    Hypertension    Pulmonary embolism ([place]) 2021   Review of Systems:   ROS   Physical Exam: There were no vitals filed for this visit.   General: *** HEENT: Conjunctiva nl , antiicteric sclerae, moist mucous membranes, no exudate or erythema Cardiovascular: Normal rate, regular rhythm.  No murmurs, rubs, or gallops Pulmonary : Equal breath sounds, No wheezes, rales, or rhonchi Abdominal: soft, nontender,  bowel sounds present Ext: No edema in lower extremities, no tenderness to palpation of lower extremities.   Assessment & Plan:   See Encounters Tab for problem based charting.  Patient {GC/GE:[id number]::"discussed with","seen with"} Kim Anthony","Anthony","Anthony","Anthony","Anthony","Anthony","Anthony"}

## 2022-09-15 ENCOUNTER — Other Ambulatory Visit: Payer: Self-pay

## 2022-09-20 MED ORDER — PANTOPRAZOLE SODIUM 40 MG PO TBEC: 40.0000 mg | DELAYED_RELEASE_TABLET | Freq: Every day | ORAL | 0 refills | Status: DC

## 2022-09-21 ENCOUNTER — Emergency Department (HOSPITAL_COMMUNITY): Payer: 59

## 2022-09-21 ENCOUNTER — Other Ambulatory Visit: Payer: Self-pay

## 2022-09-21 ENCOUNTER — Inpatient Hospital Stay (HOSPITAL_COMMUNITY)
Admission: EM | Admit: 2022-09-21 | Discharge: 2022-09-29 | DRG: 480 | Disposition: A | Discharge status: Skilled Nursing Facility | Payer: 59 | Attending: Internal Medicine | Admitting: Internal Medicine

## 2022-09-21 DIAGNOSIS — G8929 Other chronic pain: Secondary | ICD-10-CM | POA: Diagnosis present

## 2022-09-21 DIAGNOSIS — M25512 Pain in left shoulder: Secondary | ICD-10-CM | POA: Diagnosis not present

## 2022-09-21 DIAGNOSIS — I10 Essential (primary) hypertension: Secondary | ICD-10-CM | POA: Diagnosis present

## 2022-09-21 DIAGNOSIS — I5040 Unspecified combined systolic (congestive) and diastolic (congestive) heart failure: Secondary | ICD-10-CM | POA: Diagnosis present

## 2022-09-21 DIAGNOSIS — I480 Paroxysmal atrial fibrillation: Secondary | ICD-10-CM | POA: Diagnosis present

## 2022-09-21 DIAGNOSIS — Z5181 Encounter for therapeutic drug level monitoring: Secondary | ICD-10-CM

## 2022-09-21 DIAGNOSIS — N1831 Chronic kidney disease, stage 3a: Secondary | ICD-10-CM | POA: Diagnosis present

## 2022-09-21 DIAGNOSIS — Z86711 Personal history of pulmonary embolism: Secondary | ICD-10-CM

## 2022-09-21 DIAGNOSIS — Z716 Tobacco abuse counseling: Secondary | ICD-10-CM

## 2022-09-21 DIAGNOSIS — M80051A Age-related osteoporosis with current pathological fracture, right femur, initial encounter for fracture: Principal | ICD-10-CM | POA: Diagnosis present

## 2022-09-21 DIAGNOSIS — Z888 Allergy status to other drugs, medicaments and biological substances status: Secondary | ICD-10-CM

## 2022-09-21 DIAGNOSIS — Z79899 Other long term (current) drug therapy: Secondary | ICD-10-CM | POA: Diagnosis not present

## 2022-09-21 DIAGNOSIS — Z952 Presence of prosthetic heart valve: Secondary | ICD-10-CM | POA: Diagnosis not present

## 2022-09-21 DIAGNOSIS — R739 Hyperglycemia, unspecified: Secondary | ICD-10-CM | POA: Diagnosis not present

## 2022-09-21 DIAGNOSIS — I5022 Chronic systolic (congestive) heart failure: Secondary | ICD-10-CM | POA: Diagnosis not present

## 2022-09-21 DIAGNOSIS — J9601 Acute respiratory failure with hypoxia: Secondary | ICD-10-CM | POA: Diagnosis not present

## 2022-09-21 DIAGNOSIS — M1712 Unilateral primary osteoarthritis, left knee: Secondary | ICD-10-CM | POA: Diagnosis present

## 2022-09-21 DIAGNOSIS — I5042 Chronic combined systolic (congestive) and diastolic (congestive) heart failure: Secondary | ICD-10-CM | POA: Diagnosis present

## 2022-09-21 DIAGNOSIS — K219 Gastro-esophageal reflux disease without esophagitis: Secondary | ICD-10-CM | POA: Diagnosis present

## 2022-09-21 DIAGNOSIS — R791 Abnormal coagulation profile: Secondary | ICD-10-CM | POA: Diagnosis present

## 2022-09-21 DIAGNOSIS — D72829 Elevated white blood cell count, unspecified: Secondary | ICD-10-CM | POA: Diagnosis not present

## 2022-09-21 DIAGNOSIS — Z885 Allergy status to narcotic agent status: Secondary | ICD-10-CM

## 2022-09-21 DIAGNOSIS — F17210 Nicotine dependence, cigarettes, uncomplicated: Secondary | ICD-10-CM | POA: Diagnosis present

## 2022-09-21 DIAGNOSIS — Z853 Personal history of malignant neoplasm of breast: Secondary | ICD-10-CM | POA: Diagnosis not present

## 2022-09-21 DIAGNOSIS — F17200 Nicotine dependence, unspecified, uncomplicated: Secondary | ICD-10-CM | POA: Diagnosis present

## 2022-09-21 DIAGNOSIS — I2510 Atherosclerotic heart disease of native coronary artery without angina pectoris: Secondary | ICD-10-CM | POA: Diagnosis present

## 2022-09-21 DIAGNOSIS — E785 Hyperlipidemia, unspecified: Secondary | ICD-10-CM | POA: Diagnosis present

## 2022-09-21 DIAGNOSIS — Z803 Family history of malignant neoplasm of breast: Secondary | ICD-10-CM

## 2022-09-21 DIAGNOSIS — W19XXXA Unspecified fall, initial encounter: Principal | ICD-10-CM

## 2022-09-21 DIAGNOSIS — J4489 Other specified chronic obstructive pulmonary disease: Secondary | ICD-10-CM | POA: Diagnosis present

## 2022-09-21 DIAGNOSIS — Z91048 Other nonmedicinal substance allergy status: Secondary | ICD-10-CM

## 2022-09-21 DIAGNOSIS — Z8249 Family history of ischemic heart disease and other diseases of the circulatory system: Secondary | ICD-10-CM

## 2022-09-21 DIAGNOSIS — S72001A Fracture of unspecified part of neck of right femur, initial encounter for closed fracture: Secondary | ICD-10-CM

## 2022-09-21 DIAGNOSIS — Z923 Personal history of irradiation: Secondary | ICD-10-CM

## 2022-09-21 DIAGNOSIS — I50814 Right heart failure due to left heart failure: Secondary | ICD-10-CM | POA: Diagnosis present

## 2022-09-21 DIAGNOSIS — Z9012 Acquired absence of left breast and nipple: Secondary | ICD-10-CM | POA: Diagnosis not present

## 2022-09-21 DIAGNOSIS — Z7901 Long term (current) use of anticoagulants: Secondary | ICD-10-CM

## 2022-09-21 DIAGNOSIS — S72141A Displaced intertrochanteric fracture of right femur, initial encounter for closed fracture: Secondary | ICD-10-CM | POA: Insufficient documentation

## 2022-09-21 DIAGNOSIS — E876 Hypokalemia: Secondary | ICD-10-CM | POA: Diagnosis present

## 2022-09-21 DIAGNOSIS — S72143A Displaced intertrochanteric fracture of unspecified femur, initial encounter for closed fracture: Secondary | ICD-10-CM | POA: Diagnosis present

## 2022-09-21 DIAGNOSIS — J449 Chronic obstructive pulmonary disease, unspecified: Secondary | ICD-10-CM | POA: Diagnosis present

## 2022-09-21 DIAGNOSIS — Z9221 Personal history of antineoplastic chemotherapy: Secondary | ICD-10-CM

## 2022-09-21 DIAGNOSIS — Z7951 Long term (current) use of inhaled steroids: Secondary | ICD-10-CM

## 2022-09-21 DIAGNOSIS — I130 Hypertensive heart and chronic kidney disease with heart failure and stage 1 through stage 4 chronic kidney disease, or unspecified chronic kidney disease: Secondary | ICD-10-CM | POA: Diagnosis present

## 2022-09-21 DIAGNOSIS — I5043 Acute on chronic combined systolic (congestive) and diastolic (congestive) heart failure: Secondary | ICD-10-CM | POA: Diagnosis not present

## 2022-09-21 DIAGNOSIS — W109XXA Fall (on) (from) unspecified stairs and steps, initial encounter: Secondary | ICD-10-CM | POA: Diagnosis present

## 2022-09-21 DIAGNOSIS — Z86718 Personal history of other venous thrombosis and embolism: Secondary | ICD-10-CM

## 2022-09-21 DIAGNOSIS — I484 Atypical atrial flutter: Secondary | ICD-10-CM | POA: Diagnosis present

## 2022-09-21 LAB — COMPREHENSIVE METABOLIC PANEL
ALT: 26 U/L (ref 0–44)
AST: 39 U/L (ref 15–41)
Albumin: 3.7 g/dL (ref 3.5–5.0)
Alkaline Phosphatase: 108 U/L (ref 38–126)
Anion gap: 16 — ABNORMAL HIGH (ref 5–15)
BUN: 38 mg/dL — ABNORMAL HIGH (ref 8–23)
CO2: 20 mmol/L — ABNORMAL LOW (ref 22–32)
Calcium: 8.5 mg/dL — ABNORMAL LOW (ref 8.9–10.3)
Chloride: 101 mmol/L (ref 98–111)
Creatinine, Ser: 1.04 mg/dL — ABNORMAL HIGH (ref 0.44–1.00)
GFR, Estimated: 57 mL/min — ABNORMAL LOW (ref >60)
Glucose, Bld: 148 mg/dL — ABNORMAL HIGH (ref 70–99)
Potassium: 3 mmol/L — ABNORMAL LOW (ref 3.5–5.1)
Sodium: 137 mmol/L (ref 135–145)
Total Bilirubin: 0.7 mg/dL (ref 0.3–1.2)
Total Protein: 7.1 g/dL (ref 6.5–8.1)

## 2022-09-21 LAB — LACTIC ACID, PLASMA
Lactic Acid, Venous: 1.6 mmol/L (ref 0.5–1.9)
Lactic Acid, Venous: 2.4 mmol/L (ref 0.5–1.9)
Lactic Acid, Venous: 1.7 mmol/L (ref 0.5–1.9)
Lactic Acid, Venous: 1.9 mmol/L (ref 0.5–1.9)

## 2022-09-21 LAB — CBC
HCT: 42.3 % (ref 36.0–46.0)
Hemoglobin: 13.4 g/dL (ref 12.0–15.0)
MCH: 33.3 pg (ref 26.0–34.0)
MCHC: 31.7 g/dL (ref 30.0–36.0)
MCV: 105 fL — ABNORMAL HIGH (ref 80.0–100.0)
Platelets: 329 10*3/uL (ref 150–400)
RBC: 4.03 MIL/uL (ref 3.87–5.11)
RDW: 13.8 % (ref 11.5–15.5)
WBC: 12.3 10*3/uL — ABNORMAL HIGH (ref 4.0–10.5)
nRBC: 0.2 % (ref 0.0–0.2)

## 2022-09-21 LAB — BLOOD GAS, VENOUS
Acid-Base Excess: 3.5 mmol/L — ABNORMAL HIGH (ref 0.0–2.0)
Bicarbonate: 29.1 mmol/L — ABNORMAL HIGH (ref 20.0–28.0)
O2 Saturation: 96.5 %
Patient temperature: 36.9
pCO2, Ven: 47 mmHg (ref 44–60)
pH, Ven: 7.4 (ref 7.25–7.43)
pO2, Ven: 80 mmHg — ABNORMAL HIGH (ref 32–45)

## 2022-09-21 LAB — PROTIME-INR
INR: 3.3 — ABNORMAL HIGH (ref 0.8–1.2)
Prothrombin Time: 33.4 seconds — ABNORMAL HIGH (ref 11.4–15.2)

## 2022-09-21 LAB — MAGNESIUM: Magnesium: 2.1 mg/dL (ref 1.7–2.4)

## 2022-09-21 LAB — VITAMIN D 25 HYDROXY (VIT D DEFICIENCY, FRACTURES): Vit D, 25-Hydroxy: 5.22 ng/mL — ABNORMAL LOW (ref 30–100)

## 2022-09-21 MED ORDER — POLYETHYLENE GLYCOL 3350 17 G PO PACK
17.0000 g | PACK | Freq: Every day | ORAL | Status: DC
  Administered 2022-09-21 – 2022-09-22 (×2): 17 g via ORAL
  Filled 2022-09-21 (×2): qty 1

## 2022-09-21 MED ORDER — VITAMIN D (ERGOCALCIFEROL) 1.25 MG (50000 UNIT) PO CAPS
50000.0000 [IU] | ORAL_CAPSULE | ORAL | Status: DC
  Administered 2022-09-21 – 2022-09-28 (×2): 50000 [IU] via ORAL
  Filled 2022-09-21 (×2): qty 1

## 2022-09-21 MED ORDER — HYDROMORPHONE HCL 1 MG/ML IJ SOLN
1.0000 mg | Freq: Once | INTRAMUSCULAR | Status: AC
  Administered 2022-09-21: 1 mg via INTRAVENOUS
  Filled 2022-09-21 (×2): qty 1

## 2022-09-21 MED ORDER — POTASSIUM CHLORIDE CRYS ER 20 MEQ PO TBCR
40.0000 meq | EXTENDED_RELEASE_TABLET | Freq: Two times a day (BID) | ORAL | Status: AC
  Administered 2022-09-21: 40 meq via ORAL
  Filled 2022-09-21: qty 2

## 2022-09-21 MED ORDER — HYDROMORPHONE 1 MG/ML IV SOLN
INTRAVENOUS | Status: DC
  Administered 2022-09-21: 2.4 mg via INTRAVENOUS
  Administered 2022-09-21: 30 mg via INTRAVENOUS
  Administered 2022-09-21 – 2022-09-22 (×2): 2.7 mg via INTRAVENOUS
  Administered 2022-09-22: 2.4 mg via INTRAVENOUS
  Administered 2022-09-22: 0.9 mg via INTRAVENOUS
  Administered 2022-09-22 (×2): 1.5 mg via INTRAVENOUS
  Administered 2022-09-23: 3 mg via INTRAVENOUS
  Administered 2022-09-23: 30 mg via INTRAVENOUS
  Filled 2022-09-21 (×2): qty 30

## 2022-09-21 MED ORDER — KETOROLAC TROMETHAMINE 30 MG/ML IJ SOLN: 30.0000 mg | Freq: Four times a day (QID) | INTRAMUSCULAR | Status: DC | PRN

## 2022-09-21 MED ORDER — SIMVASTATIN 20 MG PO TABS
20.0000 mg | ORAL_TABLET | Freq: Every day | ORAL | Status: DC
  Administered 2022-09-21 – 2022-09-29 (×8): 20 mg via ORAL
  Filled 2022-09-21 (×8): qty 1

## 2022-09-21 MED ORDER — DULOXETINE HCL 20 MG PO CPEP
20.0000 mg | ORAL_CAPSULE | Freq: Every day | ORAL | Status: DC
  Administered 2022-09-21 – 2022-09-28 (×8): 20 mg via ORAL
  Filled 2022-09-21 (×9): qty 1

## 2022-09-21 MED ORDER — HYDROMORPHONE HCL 1 MG/ML IJ SOLN
1.0000 mg | INTRAMUSCULAR | Status: DC | PRN
  Administered 2022-09-21: 1 mg via INTRAVENOUS
  Filled 2022-09-21: qty 1

## 2022-09-21 MED ORDER — UMECLIDINIUM BROMIDE 62.5 MCG/ACT IN AEPB
1.0000 | INHALATION_SPRAY | Freq: Every day | RESPIRATORY_TRACT | Status: DC
  Administered 2022-09-22 – 2022-09-28 (×7): 1 via RESPIRATORY_TRACT
  Filled 2022-09-21 (×3): qty 7

## 2022-09-21 MED ORDER — METOPROLOL SUCCINATE ER 25 MG PO TB24
25.0000 mg | ORAL_TABLET | Freq: Every day | ORAL | Status: DC
  Administered 2022-09-21 – 2022-09-29 (×7): 25 mg via ORAL
  Filled 2022-09-21 (×9): qty 1

## 2022-09-21 MED ORDER — HYDROMORPHONE HCL 1 MG/ML IJ SOLN
1.0000 mg | Freq: Once | INTRAMUSCULAR | Status: AC
  Administered 2022-09-21: 1 mg via INTRAMUSCULAR

## 2022-09-21 MED ORDER — POTASSIUM CHLORIDE CRYS ER 20 MEQ PO TBCR
40.0000 meq | EXTENDED_RELEASE_TABLET | Freq: Two times a day (BID) | ORAL | Status: DC
  Administered 2022-09-21: 40 meq via ORAL
  Filled 2022-09-21: qty 2

## 2022-09-21 MED ORDER — OXYCODONE HCL 5 MG PO TABS
10.0000 mg | ORAL_TABLET | ORAL | Status: DC | PRN
  Administered 2022-09-21: 10 mg via ORAL
  Filled 2022-09-21: qty 2

## 2022-09-21 MED ORDER — OXYCODONE HCL 5 MG PO TABS: 10.0000 mg | ORAL_TABLET | Freq: Four times a day (QID) | ORAL | Status: DC | PRN

## 2022-09-21 MED ORDER — HYDROMORPHONE HCL 1 MG/ML IJ SOLN
1.0000 mg | Freq: Once | INTRAMUSCULAR | Status: AC
  Administered 2022-09-21: 1 mg via INTRAVENOUS
  Filled 2022-09-21: qty 1

## 2022-09-21 MED ORDER — DIPHENHYDRAMINE HCL 50 MG/ML IJ SOLN: 12.5000 mg | Freq: Four times a day (QID) | INTRAMUSCULAR | Status: DC | PRN

## 2022-09-21 MED ORDER — OXYCODONE-ACETAMINOPHEN 5-325 MG PO TABS
2.0000 | ORAL_TABLET | Freq: Once | ORAL | Status: AC
  Administered 2022-09-21: 2 via ORAL
  Filled 2022-09-21: qty 2

## 2022-09-21 MED ORDER — OXYCODONE HCL 5 MG PO TABS
5.0000 mg | ORAL_TABLET | ORAL | Status: DC | PRN
  Administered 2022-09-21: 5 mg via ORAL
  Filled 2022-09-21: qty 1

## 2022-09-21 MED ORDER — DAPAGLIFLOZIN PROPANEDIOL 10 MG PO TABS
10.0000 mg | ORAL_TABLET | Freq: Every day | ORAL | Status: DC
  Administered 2022-09-21: 10 mg via ORAL
  Filled 2022-09-21 (×2): qty 1

## 2022-09-21 MED ORDER — SODIUM CHLORIDE 0.9% FLUSH: 9.0000 mL | INTRAVENOUS | Status: DC | PRN

## 2022-09-21 MED ORDER — GABAPENTIN 300 MG PO CAPS
600.0000 mg | ORAL_CAPSULE | Freq: Three times a day (TID) | ORAL | Status: DC
  Administered 2022-09-21 – 2022-09-29 (×26): 600 mg via ORAL
  Filled 2022-09-21 (×26): qty 2

## 2022-09-21 MED ORDER — SENNOSIDES-DOCUSATE SODIUM 8.6-50 MG PO TABS
1.0000 | ORAL_TABLET | Freq: Every day | ORAL | Status: DC
  Administered 2022-09-22: 1 via ORAL
  Filled 2022-09-21 (×2): qty 1

## 2022-09-21 MED ORDER — ACETAMINOPHEN 650 MG RE SUPP: 650.0000 mg | Freq: Three times a day (TID) | RECTAL | Status: DC

## 2022-09-21 MED ORDER — ALBUTEROL SULFATE (2.5 MG/3ML) 0.083% IN NEBU: 2.5000 mg | INHALATION_SOLUTION | Freq: Four times a day (QID) | RESPIRATORY_TRACT | Status: DC | PRN

## 2022-09-21 MED ORDER — LOSARTAN POTASSIUM 25 MG PO TABS
12.5000 mg | ORAL_TABLET | Freq: Every day | ORAL | Status: DC
  Administered 2022-09-21: 12.5 mg via ORAL
  Filled 2022-09-21 (×3): qty 0.5

## 2022-09-21 MED ORDER — HYDROMORPHONE HCL 1 MG/ML IJ SOLN
1.0000 mg | INTRAMUSCULAR | Status: DC | PRN
  Administered 2022-09-21 (×2): 1 mg via INTRAVENOUS
  Filled 2022-09-21 (×2): qty 1

## 2022-09-21 MED ORDER — POLYETHYLENE GLYCOL 3350 17 G PO PACK: 17.0000 g | PACK | Freq: Every day | ORAL | Status: DC | PRN

## 2022-09-21 MED ORDER — KETOROLAC TROMETHAMINE 15 MG/ML IJ SOLN
15.0000 mg | Freq: Four times a day (QID) | INTRAMUSCULAR | Status: DC | PRN
  Administered 2022-09-21 (×2): 15 mg via INTRAVENOUS
  Filled 2022-09-21 (×2): qty 1

## 2022-09-21 MED ORDER — FLUTICASONE FUROATE-VILANTEROL 100-25 MCG/ACT IN AEPB
1.0000 | INHALATION_SPRAY | Freq: Every day | RESPIRATORY_TRACT | Status: DC
  Administered 2022-09-21 – 2022-09-29 (×9): 1 via RESPIRATORY_TRACT
  Filled 2022-09-21 (×3): qty 28

## 2022-09-21 MED ORDER — TORSEMIDE 20 MG PO TABS
40.0000 mg | ORAL_TABLET | Freq: Every day | ORAL | Status: DC
  Administered 2022-09-21 – 2022-09-29 (×7): 40 mg via ORAL
  Filled 2022-09-21 (×8): qty 2

## 2022-09-21 MED ORDER — ONDANSETRON HCL 4 MG/2ML IJ SOLN
4.0000 mg | Freq: Once | INTRAMUSCULAR | Status: AC
  Administered 2022-09-21: 4 mg via INTRAVENOUS
  Filled 2022-09-21: qty 2

## 2022-09-21 MED ORDER — ACETAMINOPHEN 500 MG PO TABS
1000.0000 mg | ORAL_TABLET | Freq: Three times a day (TID) | ORAL | Status: DC
  Administered 2022-09-21 – 2022-09-22 (×6): 1000 mg via ORAL
  Filled 2022-09-21 (×6): qty 2

## 2022-09-21 MED ORDER — HYDROMORPHONE HCL 1 MG/ML IJ SOLN
1.0000 mg | Freq: Once | INTRAMUSCULAR | Status: DC
  Filled 2022-09-21: qty 1

## 2022-09-21 MED ORDER — ONDANSETRON HCL 4 MG/2ML IJ SOLN: 4.0000 mg | Freq: Four times a day (QID) | INTRAMUSCULAR | Status: DC | PRN

## 2022-09-21 MED ORDER — DIPHENHYDRAMINE HCL 12.5 MG/5ML PO ELIX: 12.5000 mg | ORAL_SOLUTION | Freq: Four times a day (QID) | ORAL | Status: DC | PRN

## 2022-09-21 MED ORDER — NALOXONE HCL 0.4 MG/ML IJ SOLN: 0.4000 mg | INTRAMUSCULAR | Status: DC | PRN

## 2022-09-21 MED ORDER — PANTOPRAZOLE SODIUM 40 MG PO TBEC
40.0000 mg | DELAYED_RELEASE_TABLET | Freq: Every day | ORAL | Status: DC
  Administered 2022-09-21 – 2022-09-29 (×9): 40 mg via ORAL
  Filled 2022-09-21 (×9): qty 1

## 2022-09-21 MED ORDER — NICOTINE 14 MG/24HR TD PT24
14.0000 mg | MEDICATED_PATCH | Freq: Every day | TRANSDERMAL | Status: DC
  Administered 2022-09-21 – 2022-09-29 (×9): 14 mg via TRANSDERMAL
  Filled 2022-09-21 (×9): qty 1

## 2022-09-21 NOTE — Progress Notes (Signed)
Subjective: Went to reassess the patient regarding pain control and oxygen requirements. Upon arrival, patient was resting in bed.  Patient reports that her pain is improved from earlier today but is still having some pain in her right hip that worsens with movement.  Discussed that surgery will be performed once medically optimized.    Objective: Constitutional: resting in bed, appears uncomfortable, pleasant Cardiovascular: Regular rate, regular rhythm. No murmurs, rubs, or gallops.  Pulmonary: Normal respiratory effort on RA. No wheezes, rales, or rhonchi.   Musculoskeletal: Pain w/ movement of the R hip.  Neurological: Alert and oriented to person, place, and time. Non-focal. Skin: warm and dry.   Plan: Patient reports improved pain but still having some pain of her right hip.  Current pain regimen includes Dilaudid PCA 1 mg q4 hours PRN, IV Toradol 15 mg q6 hours PRN, oxycodone 10 mg q6 hours PRN, and scheduled Tylenol 1000 mg q8 hours.  MAP remains in the 70s.  Satting well on RA at this time. RR WNL.  Pain appears relatively well-controlled at this time given her recent hip fracture.  Will continue to reassess respiratory status given the risk of respiratory depression with multiple opioid medications. -Continue oxycodone 10 mg q6 hours PRN -Continue Dilaudid PCA PRN -Continue scheduled Tylenol -Continue IV Toradol PRN

## 2022-09-21 NOTE — Progress Notes (Signed)
Dr Stann Mainland would like to transition pt to the Dilaudid PCA to better better pain control. Once the PCA is started, he will assess pain control and peel off other narcotics on her profile and adjust dose or add basal if needed.  Onnie Boer, PharmD, BCIDP, AAHIVP, CPP Infectious Disease Pharmacist 09/21/2022 5:17 PM

## 2022-09-21 NOTE — Progress Notes (Signed)
Spoke with Dr  Carin Primrose about pts uncontrolled pain changes from earlier in shift are still ineffective all pain meds exhausted modification requested as pt calls out in pain

## 2022-09-21 NOTE — Consult Note (Addendum)
Reason for Consult: Right hip pain Referring Physician: Dr. [name] is an 72 y.o. female.  HPI: 72 year old female with past medical history significant for DVT and PE complains of right hip pain since a fall at about 4 this morning.  She got out of bed to smoke a cigarette.  She turned and fell landing on her right side.  She complains of severe pain in the right hip.  Pain is worse with any motion and better with lying still.  She is a smoker.  She takes Coumadin.  She last ate yesterday.  Past Medical History:  Diagnosis Date   Arthritis    Asthma    Breast cancer ([place]) 2001   Left Breast Cancer   CHF (congestive heart failure) (HCC)    COPD (chronic obstructive pulmonary disease) (HCC)    Coronary artery disease    GERD (gastroesophageal reflux disease)    History of mitral valve replacement with mechanical valve    HLD (hyperlipidemia)    Hypertension    Pulmonary embolism ([place]) 2021    Past Surgical History:  Procedure Laterality Date   APPLICATION OF A-CELL OF CHEST/ABDOMEN Left 01/27/2019   Procedure: APPLICATION OF A-CELL OF CHEST;  Surgeon: Wallace Going, DO;  Location: [place];  Service: Plastics;  Laterality: Left;   APPLICATION OF WOUND VAC N/A 01/27/2019   Procedure: APPLICATION OF WOUND VAC;  Surgeon: Wallace Going, DO;  Location: [place];  Service: Plastics;  Laterality: N/A;   BREAST SURGERY Left 2001   for CA   CARDIOVERSION N/A 02/26/2022   Procedure: CARDIOVERSION;  Surgeon: Donato Heinz, MD;  Location: [place];  Service: Cardiovascular;  Laterality: N/A;   COLONOSCOPY WITH PROPOFOL N/A 06/07/2018   Procedure: COLONOSCOPY WITH PROPOFOL;  Surgeon: Wilford Corner, MD;  [facility];  Service: Endoscopy;  Laterality: N/A;   DEBRIDEMENT AND CLOSURE WOUND Left 12/28/2018   Procedure: Debridement And Closure Wound;  Surgeon: Coralie Keens, MD;  Location: [place];  Service: General;  Laterality: Left;   INCISION AND  DRAINAGE OF WOUND Left 01/27/2019   Procedure: IRRIGATION AND DEBRIDEMENT OF CHEST WALL;  Surgeon: Wallace Going, DO;  Location: [place];  Service: Plastics;  Laterality: Left;   KNEE ARTHROSCOPY WITH MEDIAL MENISECTOMY Left 08/06/2021   Procedure: KNEE ARTHROSCOPY WITH PARTIAL MEDIAL MENISECTOMY, DEBRIDEMENT;  Surgeon: Susa Day, MD;  [facility];  Service: Orthopedics;  Laterality: Left;  1 HR   LATISSIMUS FLAP TO BREAST Left 01/18/2019   Procedure: LEFT LATISSIMUS FLAP TO BREAST;  Surgeon: Wallace Going, DO;  Location: [place];  Service: Plastics;  Laterality: Left;   MASTECTOMY Left    MITRAL VALVE REPLACEMENT     mechanical   POLYPECTOMY  06/07/2018   Procedure: POLYPECTOMY;  Surgeon: Wilford Corner, MD;  [facility];  Service: Endoscopy;;   RIGHT/LEFT HEART CATH AND CORONARY ANGIOGRAPHY N/A 01/09/2021   Procedure: RIGHT/LEFT HEART CATH AND CORONARY ANGIOGRAPHY;  Surgeon: Jolaine Artist, MD;  Location: [place] [facility];  Service: Cardiovascular;  Laterality: N/A;   WOUND DEBRIDEMENT Left 12/28/2018   Procedure: EXCISION OF LEFT CHRONIC CHEST WALL WOUND;  Surgeon: Coralie Keens, MD;  Location: [place];  Service: General;  Laterality: Left;    Family History  Problem Relation Age of Onset   Hypertension Mother    Cancer Father    Hypertension Father    Breast cancer Maternal Aunt 11   Heart attack Other  Social History:  reports that she has been smoking cigarettes. She has a 3.00 pack-year smoking history. She has never used smokeless tobacco. She reports current alcohol use of about 3.0 standard drinks of alcohol per week. She reports that she does not use drugs.  Allergies:  Allergies  Allergen Reactions   Lisinopril Swelling   Acetaminophen-Codeine Itching   Propoxyphene Itching    Darvocet   Tape Rash    Plastic   Tramadol Itching, Nausea And Vomiting and Rash    Medications: I have reviewed the patient's current  medications.  Results for orders placed or performed during the hospital encounter of 09/21/22 (from the past 48 hour(s))  CBC     Status: Abnormal   Collection Time: 09/21/22  4:15 AM  Result Value Ref Range   WBC 12.3 (H) 4.0 - 10.5 K/uL   RBC 4.03 3.87 - 5.11 MIL/uL   Hemoglobin 13.4 12.0 - 15.0 g/dL   HCT 42.3 36.0 - 46.0 %   MCV 105.0 (H) 80.0 - 100.0 fL   MCH 33.3 26.0 - 34.0 pg   MCHC 31.7 30.0 - 36.0 g/dL   RDW 13.8 11.5 - 15.5 %   Platelets 329 150 - 400 K/uL   nRBC 0.2 0.0 - 0.2 %    Comment: Performed at [facility], [place] [address], [place], [place] [address]  Comprehensive metabolic panel     Status: Abnormal   Collection Time: 09/21/22  4:15 AM  Result Value Ref Range   Sodium 137 135 - 145 mmol/L   Potassium 3.0 (L) 3.5 - 5.1 mmol/L   Chloride 101 98 - 111 mmol/L   CO2 20 (L) 22 - 32 mmol/L   Glucose, Bld 148 (H) 70 - 99 mg/dL    Comment: Glucose reference range applies only to samples taken after fasting for at least 8 hours.   BUN 38 (H) 8 - 23 mg/dL   Creatinine, Ser 1.04 (H) 0.44 - 1.00 mg/dL   Calcium 8.5 (L) 8.9 - 10.3 mg/dL   Total Protein 7.1 6.5 - 8.1 g/dL   Albumin 3.7 3.5 - 5.0 g/dL   AST 39 15 - 41 U/L   ALT 26 0 - 44 U/L   Alkaline Phosphatase 108 38 - 126 U/L   Total Bilirubin 0.7 0.3 - 1.2 mg/dL   GFR, Estimated 57 (L) >60 mL/min    Comment: (NOTE) Calculated using the CKD-EPI Creatinine Equation (2021)    Anion gap 16 (H) 5 - 15    Comment: Performed at [facility], [place] [address], [place], [place] [address]  Protime-INR     Status: Abnormal   Collection Time: 09/21/22  5:03 AM  Result Value Ref Range   Prothrombin Time 33.4 (H) 11.4 - 15.2 seconds   INR 3.3 (H) 0.8 - 1.2    Comment: (NOTE) INR goal varies based on device and disease states. Performed at [facility], [place] [address], [place],  [address]    *Note: Due to a large number of results and/or encounters for the requested time period, some results  have not been displayed. A complete set of results can be found in Results Review.    DG Pelvis Portable  Result Date: 09/21/2022 CLINICAL DATA:  72 year old female with history of trauma from a fall. EXAM: PORTABLE PELVIS 1-2 VIEWS COMPARISON:  Right femur radiograph 09/21/2022. FINDINGS: This study is severely limited and essentially nondiagnostic secondary to under penetration of the image. Recently demonstrated  intertrochanteric fracture of the right hip is completely obscured. Much of the bony pelvis is poorly visualized, such that acute fractures of the bony pelvis are not excluded on the basis of this examination. Visualized portions of the left proximal femur appear intact on this single view examination. IMPRESSION: 1. Limited nondiagnostic study, as above. Known intertrochanteric fracture of the right hip not well demonstrated. Electronically Signed   By: Vinnie Langton M.D.   On: 09/21/2022 05:15   DG Chest Port 1 View  Result Date: 09/21/2022 CLINICAL DATA:  Fall. EXAM: PORTABLE CHEST 1 VIEW COMPARISON:  08/27/2022 FINDINGS: Very limited study due to rotation. There is cardiomegaly with prior mitral valve replacement. Pleural based thickening and calcification in the anterior left chest with chronic osteitis, changes of prior breast cancer radiation. Left mastectomy and axillary dissection. Generalized interstitial opacity, cannot detect asymmetry given the degree of rotation. No gross effusion or pneumothorax. IMPRESSION: 1. Severely limited study due to positioning. No definite acute finding. 2. Chronic cardiomegaly, chronic lung disease, and post treatment left breast/chest. Electronically Signed   By: Jorje Guild M.D.   On: 09/21/2022 05:11   DG Femur Min 2 Views Right  Result Date: 09/21/2022 CLINICAL DATA:  Fall this morning. EXAM: RIGHT FEMUR 2 VIEWS COMPARISON:  None Available. FINDINGS: Acute intertrochanteric right femur fracture with varus angulation. The femoral head remains  located. IMPRESSION: Acute, intertrochanteric right femur fracture. Electronically Signed   By: Jorje Guild M.D.   On: 09/21/2022 05:09    ROS: No recent fever, chills, nausea, vomiting or changes in her appetite.  10 system review o/w negative. PE:  Blood pressure (!) 133/99, pulse 95, temperature 99 F (37.2 C), temperature source Oral, resp. rate (!) 22, SpO2 96 %. Well-nourished well-developed woman in mild distress due to pain.  Alert and oriented.  Normal mood and affect.  Extraocular motions are intact.  Respirations are unlabored.  The right lower extremity is slightly shortened and externally rotated.  Pain with logroll.  1+ dorsalis pedis pulse.  Intact sensibility to light touch dorsally and plantarly at the forefoot.  Active plantarflexion and dorsiflexion strength at the toes.  No lymphadenopathy.  Assessment/Plan: Right hip intertrochanteric fracture -I explained the nature of the injury to the patient in detail.  She will require surgical treatment.  We will make her n.p.o.  INR this morning is 3.3.  Potassium is low as well.  Medical optimization will be necessary prior to surgery.  Nonweightbearing on the right lower extremity.  Bedrest.  Timing of surgery to be determined.  Kim Anthony 09/21/2022, 7:27 AM

## 2022-09-21 NOTE — ED Provider Notes (Signed)
[facility] Emergency Department Provider Note [name]:  [id number]  Arrival date & time: 09/21/22     Chief Complaint   Fall   History of Present Illness   Kim Anthony is a 72 y.o. year-old female with a history of CHF, COPD, PE presenting to the ED with chief complaint of fall.  Patient walking in to her home after having a smoke and fell onto her right hip.  Having severe pain.  Denies hitting her head, no chest pain or abdominal pain, no back pain, no other injuries or complaints.  Pain is severe.  Review of Systems  A thorough review of systems was obtained and all systems are negative except as noted in the HPI and PMH.   Patient's Health History    Past Medical History:  Diagnosis Date   Arthritis    Asthma    Breast cancer ([place]) 2001   Left Breast Cancer   CHF (congestive heart failure) (HCC)    COPD (chronic obstructive pulmonary disease) (HCC)    Coronary artery disease    GERD (gastroesophageal reflux disease)    History of mitral valve replacement with mechanical valve    HLD (hyperlipidemia)    Hypertension    Pulmonary embolism ([place]) 2021    Past Surgical History:  Procedure Laterality Date   APPLICATION OF A-CELL OF CHEST/ABDOMEN Left 01/27/2019   Procedure: APPLICATION OF A-CELL OF CHEST;  Surgeon: Wallace Going, DO;  Location: [place];  Service: Plastics;  Laterality: Left;   APPLICATION OF WOUND VAC N/A 01/27/2019   Procedure: APPLICATION OF WOUND VAC;  Surgeon: Wallace Going, DO;  Location: [place];  Service: Plastics;  Laterality: N/A;   BREAST SURGERY Left 2001   for CA   CARDIOVERSION N/A 02/26/2022   Procedure: CARDIOVERSION;  Surgeon: Donato Heinz, MD;  Location: [place];  Service: Cardiovascular;  Laterality: N/A;   COLONOSCOPY WITH PROPOFOL N/A 06/07/2018   Procedure: COLONOSCOPY WITH PROPOFOL;  Surgeon: Wilford Corner, MD;  [facility];  Service: Endoscopy;  Laterality: N/A;    DEBRIDEMENT AND CLOSURE WOUND Left 12/28/2018   Procedure: Debridement And Closure Wound;  Surgeon: Coralie Keens, MD;  Location: [place];  Service: General;  Laterality: Left;   INCISION AND DRAINAGE OF WOUND Left 01/27/2019   Procedure: IRRIGATION AND DEBRIDEMENT OF CHEST WALL;  Surgeon: Wallace Going, DO;  Location: [place];  Service: Plastics;  Laterality: Left;   KNEE ARTHROSCOPY WITH MEDIAL MENISECTOMY Left 08/06/2021   Procedure: KNEE ARTHROSCOPY WITH PARTIAL MEDIAL MENISECTOMY, DEBRIDEMENT;  Surgeon: Susa Day, MD;  [facility];  Service: Orthopedics;  Laterality: Left;  1 HR   LATISSIMUS FLAP TO BREAST Left 01/18/2019   Procedure: LEFT LATISSIMUS FLAP TO BREAST;  Surgeon: Wallace Going, DO;  Location: [place];  Service: Plastics;  Laterality: Left;   MASTECTOMY Left    MITRAL VALVE REPLACEMENT     mechanical   POLYPECTOMY  06/07/2018   Procedure: POLYPECTOMY;  Surgeon: Wilford Corner, MD;  [facility];  Service: Endoscopy;;   RIGHT/LEFT HEART CATH AND CORONARY ANGIOGRAPHY N/A 01/09/2021   Procedure: RIGHT/LEFT HEART CATH AND CORONARY ANGIOGRAPHY;  Surgeon: Jolaine Artist, MD;  Location: [place] [facility];  Service: Cardiovascular;  Laterality: N/A;   WOUND DEBRIDEMENT Left 12/28/2018   Procedure: EXCISION OF LEFT CHRONIC CHEST WALL WOUND;  Surgeon: Coralie Keens, MD;  Location: [place];  Service: General;  Laterality: Left;    Family History  Problem Relation  Age of Onset   Hypertension Mother    Cancer Father    Hypertension Father    Breast cancer Maternal Aunt 70   Heart attack Other     Social History   Socioeconomic History   Marital status: Single    Spouse name: Not on file   Number of children: Not on file   Years of education: Not on file   Highest education level: Not on file  Occupational History   Not on file  Tobacco Use   Smoking status: Every Day    Packs/day: 0.10    Years: 30.00    Additional pack years:  0.00    Total pack years: 3.00    Types: Cigarettes   Smokeless tobacco: Never   Tobacco comments:    5 cigs per day  Vaping Use   Vaping Use: Never used  Substance and Sexual Activity   Alcohol use: Yes    Alcohol/week: 3.0 standard drinks of alcohol    Types: 3 Standard drinks or equivalent per week    Comment: beer- ocasional   Drug use: No   Sexual activity: Not Currently    Birth control/protection: None  Other Topics Concern   Not on file  Social History Narrative   ** Merged History Encounter **       Social Determinants of Health   Financial Resource Strain: Medium Risk (02/27/2021)   Overall Financial Resource Strain (CARDIA)    Difficulty of Paying Living Expenses: Somewhat hard  Food Insecurity: No Food Insecurity (09/03/2022)   Hunger Vital Sign    Worried About Running Out of Food in the Last Year: Never true    Ran Out of Food in the Last Year: Never true  Transportation Needs: No Transportation Needs (08/27/2022)   PRAPARE - Hydrologist (Medical): No    Lack of Transportation (Non-Medical): No  Physical Activity: Not on file  Stress: Not on file  Social Connections: Socially Isolated (11/28/2019)   Social Connection and Isolation Panel [NHANES]    Frequency of Communication with Friends and Family: More than three times a week    Frequency of Social Gatherings with Friends and Family: More than three times a week    Attends Religious Services: Never    Marine scientist or Organizations: No    Attends Music therapist: Not on file    Marital Status: Never married  Intimate Partner Violence: Not At Risk (08/27/2022)   Humiliation, Afraid, Rape, and Kick questionnaire    Fear of Current or Ex-Partner: No    Emotionally Abused: No    Physically Abused: No    Sexually Abused: No     Physical Exam   Vitals:   09/21/22 0409  BP: (!) 133/99  Pulse: 95  Resp: (!) 22  Temp: 99 F (37.2 C)  SpO2: 96%     CONSTITUTIONAL: Well-appearing, moderate distress due to pain NEURO/PSYCH:  Alert and oriented x 3, no focal deficits EYES:  eyes equal and reactive ENT/NECK:  no LAD, no JVD CARDIO: Regular rate, well-perfused, normal S1 and S2 PULM:  CTAB no wheezing or rhonchi GI/GU:  non-distended, non-tender MSK/SPINE:  No gross deformities, no edema SKIN:  no rash, atraumatic   *Additional and/or pertinent findings included in MDM below  Diagnostic and Interventional Summary    EKG Interpretation  Date/Time:    Ventricular Rate:    PR Interval:    QRS Duration:   QT Interval:  QTC Calculation:   R Axis:     Text Interpretation:         Labs Reviewed  CBC - Abnormal; Notable for the following components:      Result Value   WBC 12.3 (*)    MCV 105.0 (*)    All other components within normal limits  COMPREHENSIVE METABOLIC PANEL - Abnormal; Notable for the following components:   Potassium 3.0 (*)    CO2 20 (*)    Glucose, Bld 148 (*)    BUN 38 (*)    Creatinine, Ser 1.04 (*)    Calcium 8.5 (*)    GFR, Estimated 57 (*)    Anion gap 16 (*)    All other components within normal limits  PROTIME-INR - Abnormal; Notable for the following components:   Prothrombin Time 33.4 (*)    INR 3.3 (*)    All other components within normal limits    DG Chest [facility] 1 View  Final Result    DG Pelvis Portable  Final Result    DG Femur Min 2 Views Right  Final Result    CT HEAD WO CONTRAST (5MM)    (Results Pending)    Medications  ondansetron (ZOFRAN) injection 4 mg (4 mg Intravenous Given 09/21/22 0527)  HYDROmorphone (DILAUDID) injection 1 mg (1 mg Intramuscular Given 09/21/22 0447)  HYDROmorphone (DILAUDID) injection 1 mg (1 mg Intravenous Given 09/21/22 0527)     Procedures  /  Critical Care Procedures  ED Course and Medical Decision Making  Initial Impression and Ddx Suspect hip fracture.  The extremity is neurovascularly intact.  Soft abdomen, clear lungs, neurologically  doing well, reassuring vital signs.  Anticoagulated, will monitor closely for any worsening vital signs or new symptoms.  Past medical/surgical history that increases complexity of ED encounter: COPD  Interpretation of Diagnostics I personally reviewed the hip x-ray and my interpretation is as follows: Intertrochanteric fracture  Labs without significant blood count or electrolyte disturbance  Patient Reassessment and Ultimate Disposition/Management     Discussed with orthopedics who will follow in consultation, admitted to internal medicine.  Patient management required discussion with the following services or consulting groups:  Hospitalist Service  Complexity of Problems Addressed Acute illness or injury that poses threat of life of bodily function  Additional Data Reviewed and Analyzed Further history obtained from: EMS on arrival  Additional Factors Impacting ED Encounter Risk Use of parenteral controlled substances and Consideration of hospitalization  Barth Kirks. [place] Small, [place] [email or web address]  Final Clinical Impressions(s) / ED Diagnoses     ICD-10-CM   1. Fall, initial encounter  W19.XXXA     2. Closed fracture of right hip, initial encounter  S72.001A       ED Discharge Orders     None        Discharge Instructions Discussed with and Provided to Patient:   Discharge Instructions   None      Maudie Flakes, MD 09/21/22 0600

## 2022-09-21 NOTE — H&P (Addendum)
Date: 09/21/2022               Patient Name:  Kim Anthony [name]: [id number]  DOB: Sep 26, 1950 Age / Sex: 72 y.o., female   PCP: Iona Coach, MD         Medical Service: Internal Medicine Teaching Service         Attending Physician: Dr. Lottie Mussel, MD    First Contact: Dr. Iona Coach Pager: [phone]  Second Contact: Dr. Sanjuan Dame Pager: [phone]       After Hours (After 5p/  First Contact Pager: [phone]  weekends / holidays): Second Contact Pager: [phone]   Chief Complaint: R hip pain  History of Present Illness:   Kim Anthony is a 72 year old person living with COPD, HTN, combined HF, CAD, mechanical MVR on warfarin, pAF, PE/DVT, OA, breast cancer in 2001 s/p chemo/rad/left mastectomy, and tobacco use disorder presenting with R hip pain.   She states that she was in her usual state of health last night and feeling pretty good at home. She got out of bed around 2 or 3AM to smoke a cigarette. She got to her front porch and was able to smoke a cigarette. However, when she was about to walk back inside, she turned around and lost her balance, causing her to fall on her right side. She denies any lightheadedness, dizziness, chest pain, palpitations, SHOB, vision changes, weakness or other prodromal symptoms prior to the fall. She does have a cane that she intermittently uses at home if needed, but she did not use this last night. She remembers the act of falling and hitting the ground. Denies any LOC or head trauma. She remained on the ground in pain but was unable to get up due to the pain limiting her use of her right leg. She called her daughter who was on her way back from work. Daughter arrived and helped her stand up, but patient was unable to walk around due to pain. Daughter called EMS, who arrived and brought patient to ED.  Of note, she is on warfarin given history of mechanical MVR, pAF, and hx of PE/DVT. Last dose yesterday.  ED course: CBC with WBC 12.3. CMP  with K 3.0, Cr 1.04, bicarb 20, glucose 148, AG 16. PT 33.4, INR 3.3. Imaging consistent with right intertrochanteric fracture. CT head with no acute abnormality noted. Ortho consulted by ED provider. IMTS asked to admit.    Meds:  No current facility-administered medications on file prior to encounter.   Current Outpatient Medications on File Prior to Encounter  Medication Sig Dispense Refill   warfarin (COUMADIN) 5 MG tablet Take 1 tablet (5 mg total) by mouth daily at 4 PM. 30 tablet 2   albuterol (VENTOLIN HFA) 108 (90 Base) MCG/ACT inhaler Inhale 2 puffs into the lungs every 6 (six) hours as needed for wheezing or shortness of breath. 8 g 5   amiodarone (PACERONE) 200 MG tablet Take 1 tablet (200 mg total) by mouth daily. (Patient not taking: Reported on 08/27/2022) 90 tablet 2   diphenhydrAMINE (BENADRYL) 25 MG tablet Take 75 mg by mouth daily as needed for allergies.     DULoxetine (CYMBALTA) 20 MG capsule Take 20 mg by mouth at bedtime.     famotidine (PEPCID) 20 MG tablet Take 1 tablet (20 mg total) by mouth 2 (two) times daily. 60 tablet 0   FARXIGA 10 MG TABS tablet Take 1 tablet by mouth once daily 90 tablet 0  fluticasone (FLONASE) 50 MCG/ACT nasal spray Place 1 spray into both nostrils daily as needed for allergies. 9.9 mL 2   Fluticasone-Umeclidin-Vilant (TRELEGY ELLIPTA) 100-62.5-25 MCG/ACT AEPB Inhale 1 Inhalation into the lungs daily. 28 each 0   gabapentin (NEURONTIN) 600 MG tablet TAKE 1 & 1/2 (ONE & ONE-HALF) TABLETS BY MOUTH THREE TIMES DAILY (Patient taking differently: Take 900 mg by mouth 3 (three) times daily.) 90 tablet 0   HYDROcodone-acetaminophen (NORCO) 5-325 MG tablet Take 1 tablet by mouth every 4 (four) hours as needed for moderate pain. 150 tablet 0   losartan (COZAAR) 25 MG tablet Take 0.5 tablets (12.5 mg total) by mouth daily. 45 tablet 2   metoprolol succinate (TOPROL-XL) 25 MG 24 hr tablet Take 1 tablet (25 mg total) by mouth daily. 90 tablet 2    pantoprazole (PROTONIX) 40 MG tablet Take 1 tablet (40 mg total) by mouth daily. 90 tablet 0   potassium chloride SA (KLOR-CON M) 20 MEQ tablet Take 2 tablets (40 mEq total) by mouth 2 (two) times daily. (Patient taking differently: Take 20 mEq by mouth 2 (two) times daily.) 120 tablet 6   simvastatin (ZOCOR) 20 MG tablet Take 1 tablet (20 mg total) by mouth daily. 90 tablet 2   Spacer/Aero-Holding Chambers (BREATHERITE COLL SPACER ADULT) MISC 1 applicator by Does not apply route daily. 1 each 0   spironolactone (ALDACTONE) 25 MG tablet Take 1 tablet by mouth once daily (Patient not taking: Reported on 08/27/2022) 90 tablet 3   torsemide (DEMADEX) 20 MG tablet Take 2 tablets (40 mg total) by mouth daily. 60 tablet 3   varenicline (CHANTIX) 0.5 MG tablet Take 1 tablet (0.5 mg total) by mouth 2 (two) times daily. 60 tablet 0   -Reports not taking amiodarone or spironolactone at home.    Allergies: Allergies as of 09/21/2022 - Review Complete 09/21/2022  Allergen Reaction Noted   Lisinopril Swelling 01/07/2021   Acetaminophen-codeine Itching 07/10/2015   Propoxyphene Itching 01/18/2019   Tape Rash 03/09/2019   Tramadol Itching, Nausea And Vomiting, and Rash 07/10/2015   Past Medical History:  Diagnosis Date   Arthritis    Asthma    Breast cancer ([place]) 2001   Left Breast Cancer   CHF (congestive heart failure) (HCC)    COPD (chronic obstructive pulmonary disease) (HCC)    Coronary artery disease    GERD (gastroesophageal reflux disease)    History of mitral valve replacement with mechanical valve    HLD (hyperlipidemia)    Hypertension    Pulmonary embolism ([place]) 2021    Family History:  Family History  Problem Relation Age of Onset   Hypertension Mother    Cancer Father    Hypertension Father    Breast cancer Maternal Aunt 50   Heart attack Other     Social History:  -lives with daughter -she is retired -good support from friends and family at home -independent in ADLs  and iADLs -does not regularly use any assistive devices, has a cane if needed -PCP: Iona Coach, MD -Smokes about 5 cigarettes daily, has been smoking for about 30 years -drinks alcohol (beer or mixed drink) about once a week -no recreational drug use   Review of Systems: A complete ROS was negative except as per HPI.   Physical Exam: Blood pressure 104/87, pulse 100, temperature 98.4 F (36.9 C), temperature source Oral, resp. rate 17, height 5\' 5"  (1.651 m), weight 68 kg, SpO2 (!) 85 %. General: well-nourished elderly female, laying in bed,  uncomfortable, NAD. Head: normocephalic, atraumatic. Eyes: PERRL, EOMI. Mouth: slightly dry oral mucosa CV: tachycardic with regular rhythm, no m/r/g. Pulm: mild expiratory wheeze noted bilaterally. Normal WOB on 2L [place]. Abdomen: soft, nondistended, nontender, normoactive bowel sounds. MSK: trace peripheral edema in bilateral lower extremities. Extremities: RLE externally rotated and slightly shorter than LLE. 2+ DP pulses bilaterally. Able to wiggle toes in RLE. Neuro: AAOx3. Intact strength with plantarflexion and dorsiflexion. Sensation intact in R foot to light touch.    EKG: ordered given tachycardia, pending.   CT HEAD WO CONTRAST (5MM) Result Date: 09/21/2022 CLINICAL DATA:  72 year old female with history of head trauma. EXAM: CT HEAD WITHOUT CONTRAST TECHNIQUE: Contiguous axial images were obtained from the base of the skull through the vertex without intravenous contrast. RADIATION DOSE REDUCTION: This exam was performed according to the departmental dose-optimization program which includes automated exposure control, adjustment of the mA and/or kV according to patient size and/or use of iterative reconstruction technique. COMPARISON:  CTA of the head and neck 03/09/2022. Head CT 03/08/2022. FINDINGS: Brain: No evidence of acute infarction, hemorrhage, hydrocephalus, extra-axial collection or mass lesion/mass effect. Vascular: No  hyperdense vessel or unexpected calcification. Skull: Normal. Negative for fracture or focal lesion. Sinuses/Orbits: No acute finding. Other: None. IMPRESSION: 1. No acute intracranial abnormalities. The appearance of the brain is normal for age. Electronically Signed   By: Vinnie Langton M.D.   On: 09/21/2022 07:52   DG Pelvis Portable Result Date: 09/21/2022 CLINICAL DATA:  72 year old female with history of trauma from a fall. EXAM: PORTABLE PELVIS 1-2 VIEWS COMPARISON:  Right femur radiograph 09/21/2022. FINDINGS: This study is severely limited and essentially nondiagnostic secondary to under penetration of the image. Recently demonstrated intertrochanteric fracture of the right hip is completely obscured. Much of the bony pelvis is poorly visualized, such that acute fractures of the bony pelvis are not excluded on the basis of this examination. Visualized portions of the left proximal femur appear intact on this single view examination. IMPRESSION: 1. Limited nondiagnostic study, as above. Known intertrochanteric fracture of the right hip not well demonstrated. Electronically Signed   By: Vinnie Langton M.D.   On: 09/21/2022 05:15   DG Chest Port 1 View Result Date: 09/21/2022 CLINICAL DATA:  Fall. EXAM: PORTABLE CHEST 1 VIEW COMPARISON:  08/27/2022 FINDINGS: Very limited study due to rotation. There is cardiomegaly with prior mitral valve replacement. Pleural based thickening and calcification in the anterior left chest with chronic osteitis, changes of prior breast cancer radiation. Left mastectomy and axillary dissection. Generalized interstitial opacity, cannot detect asymmetry given the degree of rotation. No gross effusion or pneumothorax. IMPRESSION: 1. Severely limited study due to positioning. No definite acute finding. 2. Chronic cardiomegaly, chronic lung disease, and post treatment left breast/chest. Electronically Signed   By: Jorje Guild M.D.   On: 09/21/2022 05:11   DG Femur Min 2  Views Right Result Date: 09/21/2022 CLINICAL DATA:  Fall this morning. EXAM: RIGHT FEMUR 2 VIEWS COMPARISON:  None Available. FINDINGS: Acute intertrochanteric right femur fracture with varus angulation. The femoral head remains located. IMPRESSION: Acute, intertrochanteric right femur fracture. Electronically Signed   By: Jorje Guild M.D.   On: 09/21/2022 05:09     Assessment & Plan by Problem: Principal Problem:   Intertrochanteric fracture Active Problems:   Chronic combined systolic and diastolic heart failure   HLD (hyperlipidemia)   Essential hypertension   Tobacco use disorder   COPD (chronic obstructive pulmonary disease)   S/P MVR (mitral valve replacement)  Chronic anticoagulation   Osteoarthritis of left knee  R intertochanteric fracture 2/2 likely mechanical fall No prodromal symptoms prior to fall and no syncope noted. Appears to be a mechanical fall that resulted in R intertrochanteric fracture. It was a ground-level fall so this is a fragility fracture. Orthopedics has evaluated patient, will require operative management. Unclear timing of surgery, but patient made NPO in anticipation. She is to remain on bedrest and NWB to RLE. Her INR is 3.3 (goal 2.5-3.5), pharmacy consulted to manage this. Pain control with scheduled tylenol, oxycodone 5mg  q4 prn, and dilaudid 1mg  q4 prn for breakthrough. -appreciate orthopedic management -appreciate pharmacy management of warfarin, holding currently -NPO -bedrest, NWB to RLE -scheduled tylenol 1000mg  q8h, oxycodone 5mg  q4 prn for severe pain, dilaudid 1mg  q4 prn for breakthrough -f/u vitamin D level -consider bisphosphonate therapy at discharge or at hospital follow up  Anion gap metabolic acidosis Patient with bicarbonate 20 and AG 16. Unclear as to exact etiology. She is on farxiga and thus euglycemic DKA is a possibility, although no prior diagnosis of diabetes and does not appear to be profoundly dehydrated so low suspicion for  this. Her VBG with pH 7.4, bicarb slightly elevated at 29, and normal pCO2 of 47. Will check BMP tomorrow AM to ensure gap has closed. -f/u BMP tomorrow AM  Hypokalemia K 3.0 on CMP, will replete and check magnesium level. -f/u Mg level -PO potassium 19mEq x2 -trend electrolytes and replete as needed  Acute hypoxic respiratory failure COPD Tobacco use disorder O2 saturations were fluctuating between 84-89%, placed on 2L [place] with improvement to low 90s. She does have mild expiratory wheezing but does not appear to be in florid exacerbation. She has not used her inhalers today so will restart these and assess response. Still smoking cigarettes at home, will place nicotine patch while here. -supplemental O2 prn, maintain O2 saturations >88% and wean off O2 as able -breo ellipta + incruse ellipta (in place of home trelegy)  -home albuterol nebs q6 prn -nicotine patch -continue counseling on smoking cessation  HTN CAD Chronic combined systolic and diastolic HF ECHO in [id number] with EF 35-40%, severe RV dysfunction, stable MVR, severe TVR. Follows [facility] HF team (Dr. Haroldine Laws) in the outpatient setting. She is not grossly fluid overloaded on exam, just with trace peripheral edema in bilateral lower extremities. Her kidney function is stable, will resume her home GDMT. She states she is not taking spironolactone at home. Will resume her home maintenance torsemide dose. -continue losartan 12.5mg  daily -continue farxiga 10mg  daily -continue toprol-xl 25mg  daily -continue torsemide 40mg  daily -trend I/O's  Paroxysmal A-flutter Follows cardiology in the outpatient setting. She underwent TEE and DCCV on 02/2022 and converted to NSR. She is in normal sinus on exam today. States that she was taken off of amiodarone. Last office visit note from Dr. Haroldine Laws on 06/11/2022 states that she was instructed to stop amiodarone due to long Qtc although note does mention to continue amiodarone 200mg  daily. Has  been referred to AF clinic to discuss antiarrhythmic drug therapy vs ablation, no visit yet noted on chart review. She is on warfarin for chronic anticoagulation, followed by Dr. Elie Confer. -pharmacy consulted for management of warfarin dosing -holding amiodarone for now -continue toprol-xl 25mg  daily  S/p mechanical mitral valve replacement On chronic warfarin therapy Performed in [place] in 2001. Recent ECHO on 04/2022 with stable MVR with mean gradient 3. On warfarin for anticoagulation, follows Dr. Elie Confer. Pharmacy consulted for management of dosing, especially in  the setting of needing operative repair for R intertrochanteric fracture. -appreciate pharmacy monitoring and management -trend PT/INR, goal INR 2.5-3.5  CKD IIIA Baseline Cr appears to be around 1-1.2 with GFRs in the 20s. On admission, Cr 1.04 and GFR 57, stable. -intermittently trend kidney function -monitor UOP  Hyperglycemia Last A1c 5.3% in 2017. No formal diagnosis of diabetes. CMP with glucose 148, may be in the setting of R intertrochanteric fracture. Will recheck A1c and check fasting glucose tomorrow morning. Holding off on SSI at this time given she is to remain NPO until surgery.  -f/u A1c -goal BG 140-180 -start SSI after operative repair if remains hyperglycemic  HLD Lipid panel on 02/2022 with LDL 44.  -resumed home zocor 20mg  daily  Dispo: Admit patient to Inpatient with expected length of stay greater than 2 midnights.  Signed: Virl Axe, MD 09/21/2022, 8:58 AM  Pager: [phone] After 5pm on weekdays and 1pm on weekends: On Call pager: [phone]

## 2022-09-21 NOTE — Hospital Course (Addendum)
Recent Discharge: -Patient discharged on 08/29/2022 after being hospitalized for acute hypoxic respiratory failure secondary to COPD exacerbation (discharged on Trelegy), noted to have supratherapeutic INR (initially held warfarin on admission but resumed home dose at discharge)    Medications: -Albuterol inhaler -Trilogy Ellipta inhaler -Fluticasone nasal spray -Diphenhydramine 75 mg daily PRN -Prednisone 40 mg daily (should be completed) -Azithromycin 500 mg daily (should be completed)  -Duloxetine 20 mg daily -Gabapentin 900 mg TID -Hydrocodone-acetaminophen 5-325 mg q4 hours PRN  -Famotidine 20 mg BID -Pantoprazole 40 mg daily  -Farxiga 10 mg daily -Losartan 12.5 mg daily -Metoprolol succinate 25 mg daily -Spironolactone 25 mg daily -Torsemide 40 mg daily  -Simvastatin 20 mg daily -Amiodarone 200 mg daily -Warfarin 5 mg (1 tablet on Wednesdays/Sundays, 1/2 tablet on all other days) -Varenicline 0.5 mg BID  -Potassium 40 mEq BID (should be completed)  Allergies:  Code:  Plan: percocet 5-325 mg for pain  04/02: Shower her how it use a Banker. Daughter is at bedside who is asked about the plan. Explained the plan to her and answered all of her questions.   04/03: Having 8/10 pain. No CP, SOB, dizzy, or lightheaded. No abdominal pain. She is trying to have a bowel movement. She states that her breathing is okay. She states that other than the leg pain, everything else is good.   04/04: She has not had a bowel movement as of yet. She states she would like to.   04/05: She states that she is not doing well. She is having a good amount of pain. She has pain in her knees and hips. She reports working with PT/OT was alright but sh egot tired. She walked to the bathroom, used the potty. She was able to get weight on the leg. She was not able to get out of the room. She reports hjaving a bowel movement and it was big. She denies dyspnea. She report a  history of arthritis. Abdomen is still sore from yesterday but less distended. She reports that she has a lot of gas. She notes that the dilaudid helped her and she states she would like more of its. Explained the plan to the patient and answered all of her questions.    Often the problem lies in that these "Skilled Nursing Facilities" really do [facility]) lack "skilled nursing." I am not certain IF the primary care provider assigned to the facility will have the wherewithal to dose the warfarin to be honest. You CAN provide my name  Hulen Luster, PharmD,CPP) and phone number [phone]) as well as email:  [email or web address] as a part of your discharge summary and instructions to the receiving SNF to have the INRs ordered and RESULTED TO ME by any of the options I just listed. I would have the first one ordered for [facility] 8-APR-24. An additional problem Dr.  Aida Raider that these labs (PT/INRs) are often collected and then sent to a "send-out" lab, e.g. [facility], etc. Processing time and then reporting time add additional time delays. In your order to them to collect--you again, can stipulate that I be called with results and results could be faxed to the [facility] where the triage nurse will make sure I am made aware of the results.  12:01 PM I would continue the 7.5 milligram dosing of warfarin--today, Saturday and Sunday--with PT/INR to be collected on Monday 8-APR-24. 12:03 PM I would continue the enoxaparin/Lovenox as currently ordered as part of the pharmacy consult [facility] reflects 75  milligrams of enoxaparin/Lovenox, SQ q12 hours). CONTINUE THIS until I/you/we see an INR RESULTED from the Monday 8-APR-24 collection (remembering, we may not see RESULTS for 24-48 h unfortunately) 12:05 PM

## 2022-09-21 NOTE — ED Notes (Signed)
Pt to CT

## 2022-09-21 NOTE — Progress Notes (Signed)
   09/21/22 0700  Spiritual Encounters  Type of Visit Initial  Care provided to: Patient  Referral source Patient request  Reason for visit Routine spiritual support  OnCall Visit Yes  Spiritual Framework  Presenting Themes Values and beliefs   Chaplain was in ED for another PT and heard this PT crying out in pain saying "Oh Lord Jesus help me!"  Chaplain stopped in the room and asked PT if she would like some prayer.  PT was anxious for Chaplain to pray.  Pt talked a while with chaplain and asked that he follow up with her later in the week.

## 2022-09-21 NOTE — Progress Notes (Addendum)
ANTICOAGULATION CONSULT NOTE - Initial Consult  Pharmacy Consult for warfarin Indication: Hx PE, MVR, Afib  Allergies  Allergen Reactions   Lisinopril Swelling   Acetaminophen-Codeine Itching   Propoxyphene Itching    Darvocet   Tape Rash    Plastic   Tramadol Itching, Nausea And Vomiting and Rash    Patient Measurements:     Vital Signs: Temp: 97.6 F (36.4 C) (04/01 0754) Temp Source: Temporal (04/01 0754) BP: 136/101 (04/01 0754) Pulse Rate: 101 (04/01 0754)  Labs: Recent Labs    09/21/22 0415 09/21/22 0503  HGB 13.4  --   HCT 42.3  --   PLT 329  --   LABPROT  --  33.4*  INR  --  3.3*  CREATININE 1.04*  --     CrCl cannot be calculated (Unknown ideal weight.).   Medical History: Past Medical History:  Diagnosis Date   Arthritis    Asthma    Breast cancer ([place]) 2001   Left Breast Cancer   CHF (congestive heart failure) (HCC)    COPD (chronic obstructive pulmonary disease) (HCC)    Coronary artery disease    GERD (gastroesophageal reflux disease)    History of mitral valve replacement with mechanical valve    HLD (hyperlipidemia)    Hypertension    Pulmonary embolism ([place]) 2021    Assessment: 47 YOF presenting with fall, hip fx, on warfarin PTA with INR on admission 3.3, now npo and on hold for surgery  Goal of Therapy:  2.5-3.5 Monitor platelets by anticoagulation protocol: Yes   Plan:  Daily INR, s/s bleeding F/u ortho recs and ability to restart [facility] post-op  Bertis Ruddy, PharmD, [place] Pharmacist ED Pharmacist Phone # [phone] 09/21/2022 8:05 AM

## 2022-09-21 NOTE — ED Notes (Signed)
ED TO INPATIENT HANDOFF REPORT  ED Nurse Name and Phone #: Daleen Snook [place] Name/Age/Gender Kim Anthony 72 y.o. female Room/Bed: TRACC/TRACC  Code Status   Code Status: Full Code  Home/SNF/Other Home Patient oriented to: self, place, time, and situation Is this baseline? Yes   Triage Complete: Triage complete  Chief Complaint Intertrochanteric fracture [S72.143A]  Triage Note Pt to ED via PTAR from home. Pt states she fell this morning while walking on her porch. Pt states she tripped and fell / mechanical fall. Pt denies hitting head. No LOC. No blood thinners. Pt c/o right sided hip pain and swelling.   EMS Vitals: 140/84 93% RA 107 HR 20 RR    Allergies Allergies  Allergen Reactions   Lisinopril Swelling   Acetaminophen-Codeine Itching   Propoxyphene Itching    Darvocet   Tape Rash    Plastic   Tramadol Itching, Nausea And Vomiting and Rash    Level of Care/Admitting Diagnosis ED Disposition     ED Disposition  Admit   Condition  --   Comment  Hospital Area: [facility] [id number]  Level of Care: Med-Surg [16]  May admit patient to [facility] or [facility] if equivalent level of care is available:: No  Covid Evaluation: Asymptomatic - no recent exposure (last 10 days) testing not required  Diagnosis: Intertrochanteric fracture [id number]  Admitting Physician: Lottie Mussel [id number]  Attending Physician: Lottie Mussel [id number]  Certification:: I certify this patient will need inpatient services for at least 2 midnights  Estimated Length of Stay: 3          B Medical/Surgery History Past Medical History:  Diagnosis Date   Arthritis    Asthma    Breast cancer ([place]) 2001   Left Breast Cancer   CHF (congestive heart failure) ([place])    COPD (chronic obstructive pulmonary disease) ([place])    Coronary artery disease    GERD (gastroesophageal reflux disease)    History of mitral valve replacement with mechanical valve    HLD  (hyperlipidemia)    Hypertension    Pulmonary embolism ([place]) 2021   Past Surgical History:  Procedure Laterality Date   APPLICATION OF A-CELL OF CHEST/ABDOMEN Left 01/27/2019   Procedure: APPLICATION OF A-CELL OF CHEST;  Surgeon: Wallace Going, DO;  Location: [facility];  Service: Plastics;  Laterality: Left;   APPLICATION OF WOUND VAC N/A 01/27/2019   Procedure: APPLICATION OF WOUND VAC;  Surgeon: Wallace Going, DO;  Location: [facility];  Service: Plastics;  Laterality: N/A;   BREAST SURGERY Left 2001   for CA   CARDIOVERSION N/A 02/26/2022   Procedure: CARDIOVERSION;  Surgeon: Donato Heinz, MD;  Location: [facility];  Service: Cardiovascular;  Laterality: N/A;   COLONOSCOPY WITH PROPOFOL N/A 06/07/2018   Procedure: COLONOSCOPY WITH PROPOFOL;  Surgeon: Wilford Corner, MD;  [facility];  Service: Endoscopy;  Laterality: N/A;   DEBRIDEMENT AND CLOSURE WOUND Left 12/28/2018   Procedure: Debridement And Closure Wound;  Surgeon: Coralie Keens, MD;  Location: [facility];  Service: General;  Laterality: Left;   INCISION AND DRAINAGE OF WOUND Left 01/27/2019   Procedure: IRRIGATION AND DEBRIDEMENT OF CHEST WALL;  Surgeon: Wallace Going, DO;  Location: [facility];  Service: Plastics;  Laterality: Left;   KNEE ARTHROSCOPY WITH MEDIAL MENISECTOMY Left 08/06/2021   Procedure: KNEE ARTHROSCOPY WITH PARTIAL MEDIAL MENISECTOMY, DEBRIDEMENT;  Surgeon: Susa Day, MD;  [facility];  Service: Orthopedics;  Laterality: Left;  1 HR  LATISSIMUS FLAP TO BREAST Left 01/18/2019   Procedure: LEFT LATISSIMUS FLAP TO BREAST;  Surgeon: Wallace Going, DO;  Location: [facility];  Service: Plastics;  Laterality: Left;   MASTECTOMY Left    MITRAL VALVE REPLACEMENT     mechanical   POLYPECTOMY  06/07/2018   Procedure: POLYPECTOMY;  Surgeon: Wilford Corner, MD;  [facility];  Service: Endoscopy;;   RIGHT/LEFT HEART CATH AND CORONARY ANGIOGRAPHY N/A 01/09/2021    Procedure: RIGHT/LEFT HEART CATH AND CORONARY ANGIOGRAPHY;  Surgeon: Jolaine Artist, MD;  Location: [facility];  Service: Cardiovascular;  Laterality: N/A;   WOUND DEBRIDEMENT Left 12/28/2018   Procedure: EXCISION OF LEFT CHRONIC CHEST WALL WOUND;  Surgeon: Coralie Keens, MD;  Location: [facility];  Service: General;  Laterality: Left;     A IV Location/Drains/Wounds Patient Lines/Drains/Airways Status     Active Line/Drains/Airways     Name Placement date Placement time Site Days   Peripheral IV 09/21/22 20 G 2.5" Right;Upper Arm 09/21/22  0505  Arm  less than 1   Wound / Incision (Open or Dehisced) 01/06/21  Breast Left Masectomy Site 01/06/21  0800  Breast  623            Intake/Output Last 24 hours No intake or output data in the 24 hours ending 09/21/22 0758  Labs/Imaging Results for orders placed or performed during the hospital encounter of 09/21/22 (from the past 48 hour(s))  CBC     Status: Abnormal   Collection Time: 09/21/22  4:15 AM  Result Value Ref Range   WBC 12.3 (H) 4.0 - 10.5 K/uL   RBC 4.03 3.87 - 5.11 MIL/uL   Hemoglobin 13.4 12.0 - 15.0 g/dL   HCT 42.3 36.0 - 46.0 %   MCV 105.0 (H) 80.0 - 100.0 fL   MCH 33.3 26.0 - 34.0 pg   MCHC 31.7 30.0 - 36.0 g/dL   RDW 13.8 11.5 - 15.5 %   Platelets 329 150 - 400 K/uL   nRBC 0.2 0.0 - 0.2 %    Comment: Performed at [facility], [place] [address] [place], [place] [address]  Comprehensive metabolic panel     Status: Abnormal   Collection Time: 09/21/22  4:15 AM  Result Value Ref Range   Sodium 137 135 - 145 mmol/L   Potassium 3.0 (L) 3.5 - 5.1 mmol/L   Chloride 101 98 - 111 mmol/L   CO2 20 (L) 22 - 32 mmol/L   Glucose, Bld 148 (H) 70 - 99 mg/dL    Comment: Glucose reference range applies only to samples taken after fasting for at least 8 hours.   BUN 38 (H) 8 - 23 mg/dL   Creatinine, Ser 1.04 (H) 0.44 - 1.00 mg/dL   Calcium 8.5 (L) 8.9 - 10.3 mg/dL   Total Protein 7.1 6.5 - 8.1 g/dL    Albumin 3.7 3.5 - 5.0 g/dL   AST 39 15 - 41 U/L   ALT 26 0 - 44 U/L   Alkaline Phosphatase 108 38 - 126 U/L   Total Bilirubin 0.7 0.3 - 1.2 mg/dL   GFR, Estimated 57 (L) >60 mL/min    Comment: (NOTE) Calculated using the CKD-EPI Creatinine Equation (2021)    Anion gap 16 (H) 5 - 15    Comment: Performed at [facility], [place] [address] [place], [place] [address]  Protime-INR     Status: Abnormal   Collection Time: 09/21/22  5:03 AM  Result Value  Ref Range   Prothrombin Time 33.4 (H) 11.4 - 15.2 seconds   INR 3.3 (H) 0.8 - 1.2    Comment: (NOTE) INR goal varies based on device and disease states. Performed at [facility], [place] [address], [place], [place] [address]    *Note: Due to a large number of results and/or encounters for the requested time period, some results have not been displayed. A complete set of results can be found in Results Review.   CT HEAD WO CONTRAST (5MM)  Result Date: 09/21/2022 CLINICAL DATA:  72 year old female with history of head trauma. EXAM: CT HEAD WITHOUT CONTRAST TECHNIQUE: Contiguous axial images were obtained from the base of the skull through the vertex without intravenous contrast. RADIATION DOSE REDUCTION: This exam was performed according to the departmental dose-optimization program which includes automated exposure control, adjustment of the mA and/or kV according to patient size and/or use of iterative reconstruction technique. COMPARISON:  CTA of the head and neck 03/09/2022. Head CT 03/08/2022. FINDINGS: Brain: No evidence of acute infarction, hemorrhage, hydrocephalus, extra-axial collection or mass lesion/mass effect. Vascular: No hyperdense vessel or unexpected calcification. Skull: Normal. Negative for fracture or focal lesion. Sinuses/Orbits: No acute finding. Other: None. IMPRESSION: 1. No acute intracranial abnormalities. The appearance of the brain is normal for age. Electronically Signed   By: Vinnie Langton M.D.   On:  09/21/2022 07:52   DG Pelvis Portable  Result Date: 09/21/2022 CLINICAL DATA:  72 year old female with history of trauma from a fall. EXAM: PORTABLE PELVIS 1-2 VIEWS COMPARISON:  Right femur radiograph 09/21/2022. FINDINGS: This study is severely limited and essentially nondiagnostic secondary to under penetration of the image. Recently demonstrated intertrochanteric fracture of the right hip is completely obscured. Much of the bony pelvis is poorly visualized, such that acute fractures of the bony pelvis are not excluded on the basis of this examination. Visualized portions of the left proximal femur appear intact on this single view examination. IMPRESSION: 1. Limited nondiagnostic study, as above. Known intertrochanteric fracture of the right hip not well demonstrated. Electronically Signed   By: Vinnie Langton M.D.   On: 09/21/2022 05:15   DG Chest Port 1 View  Result Date: 09/21/2022 CLINICAL DATA:  Fall. EXAM: PORTABLE CHEST 1 VIEW COMPARISON:  08/27/2022 FINDINGS: Very limited study due to rotation. There is cardiomegaly with prior mitral valve replacement. Pleural based thickening and calcification in the anterior left chest with chronic osteitis, changes of prior breast cancer radiation. Left mastectomy and axillary dissection. Generalized interstitial opacity, cannot detect asymmetry given the degree of rotation. No gross effusion or pneumothorax. IMPRESSION: 1. Severely limited study due to positioning. No definite acute finding. 2. Chronic cardiomegaly, chronic lung disease, and post treatment left breast/chest. Electronically Signed   By: Jorje Guild M.D.   On: 09/21/2022 05:11   DG Femur Min 2 Views Right  Result Date: 09/21/2022 CLINICAL DATA:  Fall this morning. EXAM: RIGHT FEMUR 2 VIEWS COMPARISON:  None Available. FINDINGS: Acute intertrochanteric right femur fracture with varus angulation. The femoral head remains located. IMPRESSION: Acute, intertrochanteric right femur fracture.  Electronically Signed   By: Jorje Guild M.D.   On: 09/21/2022 05:09    Pending Labs Unresulted Labs (From admission, onward)     Start     Ordered   09/22/22 0500  CBC  Tomorrow morning,   R       Question:  Specimen collection method  Answer:  IV Team=IV Team collect   09/21/22 0730  09/22/22 [id number]  Basic metabolic panel  Tomorrow morning,   R       Question:  Specimen collection method  Answer:  IV Team=IV Team collect   09/21/22 0730   09/22/22 0500  Protime-INR  Tomorrow morning,   R       Question:  Specimen collection method  Answer:  IV Team=IV Team collect   09/21/22 0730   09/22/22 0500  APTT  Tomorrow morning,   R       Question:  Specimen collection method  Answer:  IV Team=IV Team collect   09/21/22 0730   09/21/22 0732  Urinalysis, Routine w reflex microscopic -Urine, Clean Catch  Once,   R       Question:  Specimen Source  Answer:  Urine, Clean Catch   09/21/22 0731   09/21/22 0652  Magnesium  Once,   STAT       Question:  Specimen collection method  Answer:  IV Team=IV Team collect   09/21/22 0651            Vitals/Pain Today's Vitals   09/21/22 0409 09/21/22 0412 09/21/22 0722 09/21/22 0754  BP: (!) 133/99   (!) 136/101  Pulse: 95   (!) 101  Resp: (!) 22   18  Temp: 99 F (37.2 C)   97.6 F (36.4 C)  TempSrc: Oral   Temporal  SpO2: 96%   90%  PainSc:  10-Worst pain ever 10-Worst pain ever 10-Worst pain ever    Isolation Precautions No active isolations  Medications Medications  potassium chloride SA (KLOR-CON M) CR tablet 40 mEq (40 mEq Oral Given 09/21/22 0713)  acetaminophen (TYLENOL) tablet 1,000 mg (has no administration in time range)    Or  acetaminophen (TYLENOL) suppository 650 mg (has no administration in time range)  polyethylene glycol (MIRALAX / GLYCOLAX) packet 17 g (has no administration in time range)  HYDROmorphone (DILAUDID) injection 1 mg (has no administration in time range)  oxyCODONE (Oxy IR/ROXICODONE) immediate release  tablet 5 mg (5 mg Oral Given 09/21/22 0752)  ondansetron (ZOFRAN) injection 4 mg (4 mg Intravenous Given 09/21/22 0527)  HYDROmorphone (DILAUDID) injection 1 mg (1 mg Intramuscular Given 09/21/22 0447)  HYDROmorphone (DILAUDID) injection 1 mg (1 mg Intravenous Given 09/21/22 0527)  HYDROmorphone (DILAUDID) injection 1 mg (1 mg Intravenous Given 09/21/22 0714)  oxyCODONE-acetaminophen (PERCOCET/ROXICET) 5-325 MG per tablet 2 tablet (2 tablets Oral Given 09/21/22 [id number])    Mobility walks     Focused Assessments Neuro Assessment Handoff:  Swallow screen pass? Yes          Neuro Assessment:   Neuro Checks:      Has TPA been given? No If patient is a Neuro Trauma and patient is going to OR before floor call report to [facility] nurse: [phone] or [phone]   R Recommendations: See Admitting Provider Note  Report given to:   Additional Notes:

## 2022-09-21 NOTE — Progress Notes (Signed)
Reported critical Lactic to Dr Carin Primrose

## 2022-09-21 NOTE — TOC Initial Note (Signed)
Transition of Care [facility]) - Initial/Assessment Note    Patient Details  Name: Kim Anthony [name]: [id number] Date of Birth: [birth year]  [facility] CM/SW Contact:    Sharin Mons, RN Phone Number: 09/21/2022, 10:40 AM  Clinical Narrative:                  -s/p fall, suffered R hip intertrochanteric fracture. From home with family ( daughter and granddaughter). States PTA independent with ADL's, no DME usage with ambulation. Resides in a one story home. States home has  6 steps to enter front door. Pt states without transportation or RX med concerns.  Plan : ? Surgery today, Per MD:. INR this morning is 3.3. Potassium is low as well. Medical optimization will be necessary prior to surgery.  TOC team following for needs.  Expected Discharge Plan: Home/Self Care Barriers to Discharge: Continued Medical Work up   Patient Goals and CMS Choice            Expected Discharge Plan and Services                                              Prior Living Arrangements/Services   Lives with:: Adult Children (daughter and granddaughter) Patient language and need for interpreter reviewed:: Yes Do you feel safe going back to the place where you live?: Yes      Need for Family Participation in Patient Care: Yes (Comment) Care giver support system in place?: Yes (comment) Current home services: DME (showerchair, nebulizer, INR machine) Criminal Activity/Legal Involvement Pertinent to Current Situation/Hospitalization: No - Comment as needed  Activities of Daily Living Home Assistive Devices/Equipment: Cane (specify quad or straight) ADL Screening (condition at time of admission) Patient's cognitive ability adequate to safely complete daily activities?: No Is the patient deaf or have difficulty hearing?: No Does the patient have difficulty concentrating, remembering, or making decisions?: No Patient able to express need for assistance with ADLs?: Yes Does the  patient have difficulty dressing or bathing?: No Independently performs ADLs?: No Communication: Independent Dressing (OT): Needs assistance Is this a change from baseline?: Change from baseline, expected to last >3 days Grooming: Independent Feeding: Independent Bathing: Needs assistance Is this a change from baseline?: Change from baseline, expected to last >3 days Toileting: Needs assistance Is this a change from baseline?: Change from baseline, expected to last >3days In/Out Bed: Needs assistance Is this a change from baseline?: Change from baseline, expected to last >3 days Walks in Home: Needs assistance Is this a change from baseline?: Change from baseline, expected to last >3 days Does the patient have difficulty walking or climbing stairs?: Yes Weakness of Legs: Both Weakness of Arms/Hands: None  Permission Sought/Granted   Permission granted to share information with : Yes, Verbal Permission Granted  Share Information with NAME: Josefa Deary  Daughter  [phone]           Emotional Assessment Appearance:: Appears stated age Attitude/Demeanor/Rapport: Engaged Affect (typically observed): Accepting Orientation: : Oriented to Self, Oriented to Place, Oriented to  Time, Oriented to Situation Alcohol / Substance Use: Tobacco Use Psych Involvement: No (comment)  Admission diagnosis:  Intertrochanteric fracture [S72.143A] Fall, initial encounter [W19.XXXA] Closed fracture of right hip, initial encounter [S72.001A] Patient Active Problem List   Diagnosis Date Noted   Intertrochanteric fracture 09/21/2022   COVID-19 virus infection 08/19/2022  Endometrial thickening on ultrasound 07/07/2022   Gastroesophageal reflux disease without esophagitis 06/29/2022   Chronic pain of both knees 06/29/2022   Vaginal bleeding 04/01/2022   A-fib 03/13/2022   Facial edema 03/07/2022   Atypical atrial flutter    Combined systolic and diastolic heart failure [id number]   CHF  (congestive heart failure) 02/19/2022   Combined systolic and diastolic congestive heart failure 02/18/2022   Acute pulmonary edema    Edema of both lower extremities 01/13/2022   Hypotension 01/13/2022   Subacute cough 11/28/2021   COPD exacerbation 11/05/2021   Conjunctival hemorrhage of left eye 06/25/2021   Osteoarthritis of left knee 04/24/2021   Seronegative inflammatory arthritis 02/17/2021   High risk medication use 02/17/2021   Elevated transaminase level 01/27/2021   Acute idiopathic gout of multiple sites 09/30/2020   Chronic pain syndrome 01/17/2020   Monoarthritis of wrist 10/18/2019   Ganglion cyst 09/21/2019   Hip pain 08/23/2019   Thoracic ascending aortic aneurysm 07/05/2019   Anemia 02/16/2019   Breast cancer    Acquired absence of left breast 12/20/2018   COPD (chronic obstructive pulmonary disease) 11/25/2017   S/P MVR (mitral valve replacement)    Chronic anticoagulation    Tobacco use disorder 02/25/2017   Lymphadenopathy, mediastinal 02/25/2017   Essential hypertension 02/01/2016   Allergic rhinitis 02/01/2016   Healthcare maintenance 01/30/2016   Chronic low back pain 07/22/2015   Pulmonary embolism 07/11/2015   Chronic combined systolic and diastolic heart failure [id number]   Headache, common migraine 07/11/2015   HLD (hyperlipidemia) 07/11/2015   PCP:  Iona Coach, MD Pharmacy:   [facility] [place] [address] Phone: [phone] Fax: [phone]     Social Determinants of Health (SDOH) Social History: SDOH Screenings   Food Insecurity: No Food Insecurity (09/21/2022)  Housing: Low Risk  (09/21/2022)  Transportation Needs: No Transportation Needs (09/21/2022)  Utilities: Not At Risk (09/21/2022)  Recent Concern: Utilities - At Risk (07/03/2022)  Depression (PHQ2-9): Low Risk  (07/02/2022)  Financial Resource Strain: Medium Risk (02/27/2021)  Social  Connections: Socially Isolated (11/28/2019)  Tobacco Use: High Risk (08/27/2022)   SDOH Interventions:     Readmission Risk Interventions    02/19/2022    3:36 PM  Readmission Risk Prevention Plan  Transportation Screening Complete  PCP or Specialist Appt within 3-5 Days Complete  HRI or [place] Complete  Social Work Consult for [place] Planning/Counseling Complete  Palliative Care Screening Not Applicable  Medication Review Press photographer) Complete

## 2022-09-21 NOTE — ED Triage Notes (Signed)
Pt to ED via PTAR from home. Pt states she fell this morning while walking on her porch. Pt states she tripped and fell / mechanical fall. Pt denies hitting head. No LOC. No blood thinners. Pt c/o right sided hip pain and swelling.   EMS Vitals: 140/84 93% RA 107 HR 20 RR

## 2022-09-22 ENCOUNTER — Telehealth: Payer: Self-pay | Admitting: *Deleted

## 2022-09-22 DIAGNOSIS — F17210 Nicotine dependence, cigarettes, uncomplicated: Secondary | ICD-10-CM

## 2022-09-22 DIAGNOSIS — S72001A Fracture of unspecified part of neck of right femur, initial encounter for closed fracture: Secondary | ICD-10-CM | POA: Diagnosis not present

## 2022-09-22 DIAGNOSIS — J449 Chronic obstructive pulmonary disease, unspecified: Secondary | ICD-10-CM

## 2022-09-22 DIAGNOSIS — S72141A Displaced intertrochanteric fracture of right femur, initial encounter for closed fracture: Secondary | ICD-10-CM | POA: Insufficient documentation

## 2022-09-22 DIAGNOSIS — Z952 Presence of prosthetic heart valve: Secondary | ICD-10-CM

## 2022-09-22 DIAGNOSIS — I5022 Chronic systolic (congestive) heart failure: Secondary | ICD-10-CM

## 2022-09-22 HISTORY — DX: Displaced intertrochanteric fracture of right femur, initial encounter for closed fracture: S72.141A

## 2022-09-22 LAB — BASIC METABOLIC PANEL
Anion gap: 10 (ref 5–15)
BUN: 39 mg/dL — ABNORMAL HIGH (ref 8–23)
CO2: 25 mmol/L (ref 22–32)
Calcium: 8.7 mg/dL — ABNORMAL LOW (ref 8.9–10.3)
Chloride: 101 mmol/L (ref 98–111)
Creatinine, Ser: 1.09 mg/dL — ABNORMAL HIGH (ref 0.44–1.00)
GFR, Estimated: 54 mL/min — ABNORMAL LOW (ref >60)
Glucose, Bld: 118 mg/dL — ABNORMAL HIGH (ref 70–99)
Potassium: 3.7 mmol/L (ref 3.5–5.1)
Sodium: 136 mmol/L (ref 135–145)

## 2022-09-22 LAB — CBC
HCT: 35.5 % — ABNORMAL LOW (ref 36.0–46.0)
Hemoglobin: 11.6 g/dL — ABNORMAL LOW (ref 12.0–15.0)
MCH: 33.4 pg (ref 26.0–34.0)
MCHC: 32.7 g/dL (ref 30.0–36.0)
MCV: 102.3 fL — ABNORMAL HIGH (ref 80.0–100.0)
Platelets: 256 10*3/uL (ref 150–400)
RBC: 3.47 MIL/uL — ABNORMAL LOW (ref 3.87–5.11)
RDW: 13.8 % (ref 11.5–15.5)
WBC: 13.2 10*3/uL — ABNORMAL HIGH (ref 4.0–10.5)
nRBC: 0 % (ref 0.0–0.2)

## 2022-09-22 LAB — HEMOGLOBIN A1C
Hgb A1c MFr Bld: 5.5 % (ref 4.8–5.6)
Mean Plasma Glucose: 111 mg/dL

## 2022-09-22 LAB — PROTIME-INR
INR: 2.8 — ABNORMAL HIGH (ref 0.8–1.2)
INR: 1.6 — ABNORMAL HIGH (ref 0.8–1.2)
Prothrombin Time: 29.2 seconds — ABNORMAL HIGH (ref 11.4–15.2)
Prothrombin Time: 18.6 seconds — ABNORMAL HIGH (ref 11.4–15.2)

## 2022-09-22 LAB — SURGICAL PCR SCREEN
MRSA, PCR: NEGATIVE
Staphylococcus aureus: NEGATIVE

## 2022-09-22 LAB — GLUCOSE, CAPILLARY: Glucose-Capillary: 144 mg/dL — ABNORMAL HIGH (ref 70–99)

## 2022-09-22 LAB — APTT: aPTT: 39 seconds — ABNORMAL HIGH (ref 24–36)

## 2022-09-22 MED ORDER — VITAMIN K1 10 MG/ML IJ SOLN
10.0000 mg | Freq: Once | INTRAVENOUS | Status: AC
  Administered 2022-09-22: 10 mg via INTRAVENOUS
  Filled 2022-09-22: qty 1

## 2022-09-22 MED ORDER — HEPARIN (PORCINE) 25000 UT/250ML-% IV SOLN
1050.0000 [IU]/h | INTRAVENOUS | Status: AC
  Administered 2022-09-22: 1050 [IU]/h via INTRAVENOUS
  Filled 2022-09-22: qty 250

## 2022-09-22 MED ORDER — VITAMIN K1 10 MG/ML IJ SOLN
5.0000 mg | Freq: Once | INTRAVENOUS | Status: AC
  Administered 2022-09-22: 5 mg via INTRAVENOUS
  Filled 2022-09-22: qty 0.5

## 2022-09-22 NOTE — Plan of Care (Signed)
  Problem: Education: Goal: Knowledge of General Education information will improve Description: Including pain rating scale, medication(s)/side effects and non-pharmacologic comfort measures Outcome: Progressing   Problem: Clinical Measurements: Goal: Ability to maintain clinical measurements within normal limits will improve Outcome: Progressing Goal: Will remain free from infection Outcome: Progressing   Problem: Activity: Goal: Risk for activity intolerance will decrease Outcome: Progressing   Problem: Nutrition: Goal: Adequate nutrition will be maintained Outcome: Progressing   Problem: Coping: Goal: Level of anxiety will decrease Outcome: Progressing   Problem: Elimination: Goal: Will not experience complications related to bowel motility Outcome: Progressing   Problem: Pain Managment: Goal: General experience of comfort will improve Outcome: Progressing   

## 2022-09-22 NOTE — Progress Notes (Signed)
Subjective: Patient evaluated at bedside. Pain is an 8, a little better with the PCA pump. Eating and using restroom well.  Breathing well.  Objective:  Vital signs in last 24 hours: Vitals:   09/22/22 0805 09/22/22 0814 09/22/22 0930 09/22/22 1200  BP:   99/64   Pulse:      Resp: 14   16  Temp:      TempSrc:      SpO2: 92% 94%  92%  Weight:      Height:       Gen: chronically ill appearing, in pain, laying in bed cooperative CV:RRR, no m/r/g, audible mechanical mitral valve, 2+ bilateral radial pulses Pulm: normal wob on RA, LCTAB Extr: warm, dry, no LE edema  Assessment/Plan:  Principal Problem:   Intertrochanteric fracture Active Problems:   Chronic combined systolic and diastolic heart failure   HLD (hyperlipidemia)   Essential hypertension   Tobacco use disorder   COPD (chronic obstructive pulmonary disease)   S/P MVR (mitral valve replacement)   Chronic anticoagulation   Osteoarthritis of left knee   Atypical atrial flutter   Closed fracture of right hip   H/O mitral valve replacement with mechanical valve   Chronic HFrEF (heart failure with reduced ejection fraction)  R intertochanteric fracture 2/2 likely mechanical fall Patient with prior history of osteopenia on DEXA and vitamin D deficiency to 5 this admission. Will replete vitamin D, recheck in the outpatient setting, repeat DEXA with potential bisphosphonates of showing osteoporosis. Ortho to take patient to surgery, wanting INR <2 and ideally <1.8. Last dose of warfarin was taken on 09/20/22 at 17:30 INR this AM 2.8. Pharmacy planned Vitamin K 0.5mg  IV x1 this evening and consider repeat dose on 4/3 AM, if INR is not <2. Per chart review vitamin K 10mg  IV given by ortho today. Will need to bridge with heparin once INR <2.5, will need to be off heparin ggt 6 hours prior to surgery. Currently on PCA pump, oxygenating at goal and mentating well. CBC with hemoglobin 13.4 to 11.6, will need to monitor for blood  loss around fracture site. -appreciate orthopedic management -appreciate pharmacy management of warfarin, holding currently -NPO @ midnight -bedrest, NWB to RLE -scheduled tylenol 1000mg  q8h, oxycodone 5mg  q4 prn for severe pain, dilaudid 1mg  q4 prn for breakthrough -f/u 7pm INR, start heparin ggt onc INR<2.5 and hold 6 hours prior to surgery    Acute hypoxic respiratory failure COPD Tobacco use disorder -supplemental O2 prn, maintain O2 saturations >88% and wean off O2 as able -breo ellipta + incruse ellipta (in place of home trelegy)  -home albuterol nebs q6 prn -nicotine patch -continue counseling on smoking cessation   HTN CAD Chronic combined systolic and diastolic HF ECHO in [id number] with EF 35-40%, severe RV dysfunction, stable MVR, severe TVR. Follows [facility] HF team (Dr. Haroldine Laws) in the outpatient setting.  She states she is not taking spironolactone at home. Will resume her home maintenance torsemide dose. BP has been low during admission will hold losartan and farxiga for now. -hold losartan 12.5mg  daily -hold farxiga 10mg  daily -continue toprol-xl 25mg  daily -continue torsemide 40mg  daily -trend I/O's   S/p mechanical mitral valve replacement On chronic warfarin therapy Performed in [place] in 2001. Recent ECHO on 04/2022 with stable MVR with mean gradient 3. On warfarin for anticoagulation, follows Dr. Elie Confer. Holding warfarin at this time for ortho surgery. Will bridge with heparin ggt once INR <2.5. Can consider discharge with lovenox bridge if she is  stable for discharge prior to INR being at goal. -holding warfarin for now -appreciate pharmacy monitoring and management -trend PT/INR, goal INR 2.5-3.5   Paroxysmal A-flutter Follows cardiology in the outpatient setting. She underwent TEE and DCCV on 02/2022 and converted to NSR. She is in normal sinus on exam today. States that she was taken off of amiodarone. Last office visit note from Dr. Haroldine Laws on 06/11/2022  states that she was instructed to stop amiodarone due to long Qtc although note does mention to continue amiodarone 200mg  daily. Has been referred to AF clinic to discuss antiarrhythmic drug therapy vs ablation, no visit yet noted on chart review. She is on warfarin for chronic anticoagulation, followed by Dr. Elie Confer. -holding warfarin -holding amiodarone for now -continue toprol-xl 25mg  daily   CKD IIIA Baseline Cr appears to be around 1-1.2 with GFRs in the 70s. Creatinine 1.09 today and stable. -daily bmp -monitor UOP   HLD Lipid panel on 02/2022 with LDL 44.  -resumed home zocor 20mg  daily  Anion gap metabolic acidosis-resolved Bicarb 25 and gap closed to 10, will continue to monitor. -daily bmp   Hypokalemia-resolved -daily bmp  Prior to Admission Living Arrangement: Anticipated Discharge Location: Barriers to Discharge: Dispo: Anticipated discharge in approximately >2 days day(s).   Iona Coach, MD 09/22/2022, 3:24 PM Pager: Iona Coach, MD Internal Medicine Resident, PGY-1 [facility]  Pager: [phone] After 5pm on weekdays and 1pm on weekends: On Call pager [phone]

## 2022-09-22 NOTE — Progress Notes (Signed)
Subjective: The patient is HD 2 for a right hip IT fracture.  Daughter and grand daughter at the bedside.  INR this morning was too high for surgery today.  Vitamin K has been administered.  Pt notes continued pain in the right hip.  Sharp and worse with movement.  Dull and aching when still.  Tolerating a regular diet.   Objective: Vital signs in last 24 hours: Temp:  [98 F (36.7 C)-98.8 F (37.1 C)] 98.8 F (37.1 C) (04/02 1540) Pulse Rate:  [90-102] 102 (04/02 1540) Resp:  [14-18] 17 (04/02 1540) BP: (82-116)/(62-64) 116/62 (04/02 1540) SpO2:  [86 %-96 %] 94 % (04/02 1540) FiO2 (%):  [92 %] 92 % (04/02 1200) Weight:  [67.7 kg] 67.7 kg (04/02 0500)  Intake/Output from previous day: 04/01 0701 - 04/02 0700 In: 240 [P.O.:240] Out: 1650 [Urine:1650] Intake/Output this shift: Total I/O In: 240 [P.O.:240] Out: 350 [Urine:350]  Recent Labs    09/21/22 0415 09/22/22 0842  HGB 13.4 11.6*   Recent Labs    09/21/22 0415 09/22/22 0842  WBC 12.3* 13.2*  RBC 4.03 3.47*  HCT 42.3 35.5*  PLT 329 256   Recent Labs    09/21/22 0415 09/22/22 0842  NA 137 136  K 3.0* 3.7  CL 101 101  CO2 20* 25  BUN 38* 39*  CREATININE 1.04* 1.09*  GLUCOSE 148* 118*  CALCIUM 8.5* 8.7*   Recent Labs    09/21/22 0503 09/22/22 0842  INR 3.3* 2.8*    PE:  wn wd woman in nad.  A and O.  R LE shortened and externally rotated.    Assessment/Plan: R hip IT fracture - I explained the nature of the injury and the treatment plan in detail.  Dr. Erlinda Hong has graciously agreed to take the pt to surgery tomorrow at 13:00.  Pre op orders are entered.  Heparin will need to stop 6 hours prior or 0700.  NPO after MN per ERAS protocol.  The patient and her family understand the plan and agree.     Wylene Simmer 09/22/2022, 4:01 PM

## 2022-09-22 NOTE — Progress Notes (Addendum)
ANTICOAGULATION CONSULT NOTE - Initial Consult  Pharmacy Consult for warfarin Indication: Hx PE/DVT, mechanical MVR, Afib  Allergies  Allergen Reactions   Lisinopril Swelling   Acetaminophen-Codeine Itching   Propoxyphene Itching    Darvocet   Tape Rash    Plastic   Tramadol Itching, Nausea And Vomiting and Rash    Patient Measurements: Height: 5\' 5"  (165.1 cm) Weight: 67.7 kg (149 lb 4 oz) IBW/kg (Calculated) : 57   Vital Signs: Temp: 98.6 F (37 C) (04/02 0714) Temp Source: Oral (04/02 0714) BP: 84/64 (04/02 0714) Pulse Rate: 96 (04/02 0714)  Labs: Recent Labs    09/21/22 0415 09/21/22 0503 09/22/22 0842  HGB 13.4  --  11.6*  HCT 42.3  --  35.5*  PLT 329  --  256  LABPROT  --  33.4* 29.2*  INR  --  3.3* 2.8*  CREATININE 1.04*  --   --      Estimated Creatinine Clearance: 44 mL/min (A) (by C-G formula based on SCr of 1.04 mg/dL (H)).   Medical History: Past Medical History:  Diagnosis Date   Arthritis    Asthma    Breast cancer ([place]) 2001   Left Breast Cancer   CHF (congestive heart failure) (HCC)    COPD (chronic obstructive pulmonary disease) (HCC)    Coronary artery disease    GERD (gastroesophageal reflux disease)    History of mitral valve replacement with mechanical valve    HLD (hyperlipidemia)    Hypertension    Pulmonary embolism ([place]) 2021    Assessment: 74 YOF presenting with R hip fracture 2/2 fall on warfarin PTA with INR on admission 3.3. Pt has a history of PE/DVT, mechanical MVR, and Afib and was taking warfarin 5mg  po daily prior to admission. Last dose of warfarin was taken on 09/20/22 at 17:30. Plan to hold warfarin for surgery. Pharmacy consulted to initiate heparin bridging once INR < 2.5  Pharmacy to discontinue heparin infusion 4 hours prior to scheduled OR time w/ orthopedic surgery - TBD.  INR down to 2.8, therapeutic  Hgb trending down to 11.6 and Plt trending down to 256 No s/sx of bleeding noted by RN  Goal of  Therapy:  2.5-3.5 Monitor platelets by anticoagulation protocol: Yes   Plan:  Continue to HOLD warfarin Check INR and aPTT (baseline) tonight at 21:00 Start heparin bridging once INR < 2.5  Ortho planning to operate on 4/3 once INR <2, ideally, INR <1.8 If INR is not trending down fast enough this evening, give Vitamin K 0.5mg  IV x1 this evening and consider repeat dose on 4/3 AM, if INR is not <2. F/u ortho recs and ability to restart [facility] post-op   Luisa Hart, PharmD, BCPS Clinical Pharmacist 09/22/2022 10:50 AM   Please refer to [facility] for pharmacy phone number

## 2022-09-22 NOTE — Progress Notes (Signed)
ANTICOAGULATION CONSULT NOTE   Pharmacy Consult for warfarin->heparin Indication: Hx PE/DVT, mechanical MVR, Afib  Allergies  Allergen Reactions   Lisinopril Swelling   Acetaminophen-Codeine Itching   Propoxyphene Itching    Darvocet   Tape Rash    Plastic   Tramadol Itching, Nausea And Vomiting and Rash    Patient Measurements: Height: 5\' 5"  (165.1 cm) Weight: 67.7 kg (149 lb 4 oz) IBW/kg (Calculated) : 57   Vital Signs: Temp: 98.3 F (36.8 C) (04/02 1918) Temp Source: Oral (04/02 1540) BP: 99/60 (04/02 1918) Pulse Rate: 100 (04/02 1918)  Labs: Recent Labs    09/21/22 0415 09/21/22 0503 09/22/22 0842 09/22/22 1840  HGB 13.4  --  11.6*  --   HCT 42.3  --  35.5*  --   PLT 329  --  256  --   LABPROT  --  33.4* 29.2* 18.6*  INR  --  3.3* 2.8* 1.6*  CREATININE 1.04*  --  1.09*  --      Estimated Creatinine Clearance: 42 mL/min (A) (by C-G formula based on SCr of 1.09 mg/dL (H)).   Medical History: Past Medical History:  Diagnosis Date   Arthritis    Asthma    Breast cancer ([place]) 2001   Left Breast Cancer   CHF (congestive heart failure) (HCC)    COPD (chronic obstructive pulmonary disease) (HCC)    Coronary artery disease    GERD (gastroesophageal reflux disease)    History of mitral valve replacement with mechanical valve    HLD (hyperlipidemia)    Hypertension    Pulmonary embolism ([place]) 2021    Assessment: 1 YOF presenting with R hip fracture 2/2 fall on warfarin PTA with INR on admission 3.3. Pt has a history of PE/DVT, mechanical MVR, and Afib and was taking warfarin 5mg  po daily prior to admission. Last dose of warfarin was taken on 09/20/22 at 17:30. Plan to hold warfarin for surgery. Pharmacy consulted to initiate heparin bridging once INR < 2.5  INR down to 1.6 (ortho MD ordered vit K 5mg  IV again tonight). Okay to begin heparin bridge with INR < 2.5.  Goal of Therapy:  INR 2.5-3.5 Heparin level 0.3-0.7 units/ml Monitor platelets by  anticoagulation protocol: Yes   Plan:  Continue to HOLD warfarin Start heparin bridge 1050 units/hr Heparin will be turned off at 0700 (6 hrs pre surgery per Dr. Nona Dell note) Heparin level and INR in a.m. Will f/u post-op for [facility] plans  Sherlon Handing, PharmD, BCPS Please see amion for complete clinical pharmacist phone list 09/22/2022 8:23 PM

## 2022-09-22 NOTE — Telephone Encounter (Signed)
Received INR result from [facility]. 09/21/22 INR - 3.3. Pt is currently in the hospital.

## 2022-09-23 ENCOUNTER — Encounter (HOSPITAL_COMMUNITY)
Admission: EM | Disposition: A | Discharge status: Skilled Nursing Facility | Payer: Self-pay | Source: Home / Self Care | Attending: Internal Medicine

## 2022-09-23 ENCOUNTER — Inpatient Hospital Stay (HOSPITAL_COMMUNITY): Payer: 59 | Admitting: Anesthesiology

## 2022-09-23 ENCOUNTER — Encounter (HOSPITAL_COMMUNITY): Payer: Self-pay | Admitting: Internal Medicine

## 2022-09-23 ENCOUNTER — Inpatient Hospital Stay (HOSPITAL_COMMUNITY): Payer: 59

## 2022-09-23 ENCOUNTER — Other Ambulatory Visit: Payer: Self-pay

## 2022-09-23 DIAGNOSIS — J449 Chronic obstructive pulmonary disease, unspecified: Secondary | ICD-10-CM | POA: Diagnosis not present

## 2022-09-23 DIAGNOSIS — I2510 Atherosclerotic heart disease of native coronary artery without angina pectoris: Secondary | ICD-10-CM

## 2022-09-23 DIAGNOSIS — S72001A Fracture of unspecified part of neck of right femur, initial encounter for closed fracture: Secondary | ICD-10-CM

## 2022-09-23 DIAGNOSIS — I10 Essential (primary) hypertension: Secondary | ICD-10-CM

## 2022-09-23 DIAGNOSIS — F17210 Nicotine dependence, cigarettes, uncomplicated: Secondary | ICD-10-CM | POA: Diagnosis not present

## 2022-09-23 DIAGNOSIS — S72141A Displaced intertrochanteric fracture of right femur, initial encounter for closed fracture: Secondary | ICD-10-CM | POA: Diagnosis not present

## 2022-09-23 DIAGNOSIS — I5022 Chronic systolic (congestive) heart failure: Secondary | ICD-10-CM | POA: Diagnosis not present

## 2022-09-23 HISTORY — DX: Fracture of unspecified part of neck of right femur, initial encounter for closed fracture: S72.001A

## 2022-09-23 HISTORY — PX: INTRAMEDULLARY (IM) NAIL INTERTROCHANTERIC: SHX5875

## 2022-09-23 LAB — BASIC METABOLIC PANEL
Anion gap: 14 (ref 5–15)
BUN: 24 mg/dL — ABNORMAL HIGH (ref 8–23)
CO2: 25 mmol/L (ref 22–32)
Calcium: 8.9 mg/dL (ref 8.9–10.3)
Chloride: 98 mmol/L (ref 98–111)
Creatinine, Ser: 0.9 mg/dL (ref 0.44–1.00)
GFR, Estimated: >60 mL/min (ref >60)
Glucose, Bld: 131 mg/dL — ABNORMAL HIGH (ref 70–99)
Potassium: 3.3 mmol/L — ABNORMAL LOW (ref 3.5–5.1)
Sodium: 137 mmol/L (ref 135–145)

## 2022-09-23 LAB — PROTIME-INR
INR: 1.2 (ref 0.8–1.2)
Prothrombin Time: 14.7 seconds (ref 11.4–15.2)

## 2022-09-23 LAB — GLUCOSE, CAPILLARY
Glucose-Capillary: 165 mg/dL — ABNORMAL HIGH (ref 70–99)
Glucose-Capillary: 136 mg/dL — ABNORMAL HIGH (ref 70–99)

## 2022-09-23 LAB — CBC
HCT: 34.2 % — ABNORMAL LOW (ref 36.0–46.0)
Hemoglobin: 11 g/dL — ABNORMAL LOW (ref 12.0–15.0)
MCH: 33.1 pg (ref 26.0–34.0)
MCHC: 32.2 g/dL (ref 30.0–36.0)
MCV: 103 fL — ABNORMAL HIGH (ref 80.0–100.0)
Platelets: 236 10*3/uL (ref 150–400)
RBC: 3.32 MIL/uL — ABNORMAL LOW (ref 3.87–5.11)
RDW: 13.6 % (ref 11.5–15.5)
WBC: 14.9 10*3/uL — ABNORMAL HIGH (ref 4.0–10.5)
nRBC: 0 % (ref 0.0–0.2)

## 2022-09-23 LAB — TYPE AND SCREEN
ABO/RH(D): O NEG
Antibody Screen: NEGATIVE

## 2022-09-23 SURGERY — FIXATION, FRACTURE, INTERTROCHANTERIC, WITH INTRAMEDULLARY ROD
Anesthesia: General

## 2022-09-23 MED ORDER — PHENYLEPHRINE HCL-NACL 20-0.9 MG/250ML-% IV SOLN
INTRAVENOUS | Status: AC
  Filled 2022-09-23: qty 250

## 2022-09-23 MED ORDER — MEPERIDINE HCL 25 MG/ML IJ SOLN: 6.2500 mg | INTRAMUSCULAR | Status: DC | PRN

## 2022-09-23 MED ORDER — OXYCODONE HCL 5 MG PO TABS: 5.0000 mg | ORAL_TABLET | Freq: Once | ORAL | Status: DC | PRN

## 2022-09-23 MED ORDER — ESMOLOL HCL 100 MG/10ML IV SOLN
INTRAVENOUS | Status: DC | PRN
  Administered 2022-09-23 (×3): 10 mg via INTRAVENOUS
  Administered 2022-09-23: 30 mg via INTRAVENOUS

## 2022-09-23 MED ORDER — OXYCODONE-ACETAMINOPHEN 5-325 MG PO TABS: 1.0000 | ORAL_TABLET | Freq: Three times a day (TID) | ORAL | 0 refills | Status: DC | PRN

## 2022-09-23 MED ORDER — HYDROMORPHONE HCL 1 MG/ML IJ SOLN: 0.2500 mg | INTRAMUSCULAR | Status: DC | PRN

## 2022-09-23 MED ORDER — WARFARIN SODIUM 5 MG PO TABS
5.0000 mg | ORAL_TABLET | Freq: Once | ORAL | Status: DC
  Filled 2022-09-23: qty 1

## 2022-09-23 MED ORDER — ONDANSETRON HCL 4 MG/2ML IJ SOLN
INTRAMUSCULAR | Status: AC
  Filled 2022-09-23: qty 2

## 2022-09-23 MED ORDER — SUGAMMADEX SODIUM 200 MG/2ML IV SOLN
INTRAVENOUS | Status: DC | PRN
  Administered 2022-09-23: 150 mg via INTRAVENOUS

## 2022-09-23 MED ORDER — CEFAZOLIN SODIUM-DEXTROSE 2-4 GM/100ML-% IV SOLN
2.0000 g | Freq: Four times a day (QID) | INTRAVENOUS | Status: AC
  Administered 2022-09-23 – 2022-09-24 (×3): 2 g via INTRAVENOUS
  Filled 2022-09-23 (×3): qty 100

## 2022-09-23 MED ORDER — DEXAMETHASONE SODIUM PHOSPHATE 10 MG/ML IJ SOLN
INTRAMUSCULAR | Status: AC
  Filled 2022-09-23: qty 1

## 2022-09-23 MED ORDER — OXYCODONE HCL 5 MG/5ML PO SOLN: 5.0000 mg | Freq: Once | ORAL | Status: DC | PRN

## 2022-09-23 MED ORDER — CEFAZOLIN SODIUM-DEXTROSE 2-4 GM/100ML-% IV SOLN
2.0000 g | INTRAVENOUS | Status: AC
  Administered 2022-09-23: 2 g via INTRAVENOUS
  Filled 2022-09-23: qty 100

## 2022-09-23 MED ORDER — LACTATED RINGERS IV SOLN: INTRAVENOUS | Status: DC

## 2022-09-23 MED ORDER — ACETAMINOPHEN 500 MG PO TABS
1000.0000 mg | ORAL_TABLET | Freq: Four times a day (QID) | ORAL | Status: AC
  Administered 2022-09-23 – 2022-09-24 (×3): 1000 mg via ORAL
  Filled 2022-09-23 (×3): qty 2

## 2022-09-23 MED ORDER — POLYETHYLENE GLYCOL 3350 17 G PO PACK
17.0000 g | PACK | Freq: Two times a day (BID) | ORAL | Status: DC
  Administered 2022-09-23 – 2022-09-28 (×7): 17 g via ORAL
  Filled 2022-09-23 (×11): qty 1

## 2022-09-23 MED ORDER — WARFARIN SODIUM 5 MG PO TABS: 5.0000 mg | ORAL_TABLET | Freq: Every day | ORAL | Status: DC

## 2022-09-23 MED ORDER — POTASSIUM CHLORIDE CRYS ER 20 MEQ PO TBCR
40.0000 meq | EXTENDED_RELEASE_TABLET | Freq: Once | ORAL | Status: AC
  Administered 2022-09-23: 40 meq via ORAL
  Filled 2022-09-23: qty 2

## 2022-09-23 MED ORDER — ONDANSETRON HCL 4 MG/2ML IJ SOLN: 4.0000 mg | Freq: Four times a day (QID) | INTRAMUSCULAR | Status: DC | PRN

## 2022-09-23 MED ORDER — ROCURONIUM BROMIDE 10 MG/ML (PF) SYRINGE
PREFILLED_SYRINGE | INTRAVENOUS | Status: AC
  Filled 2022-09-23: qty 10

## 2022-09-23 MED ORDER — OXYCODONE HCL 5 MG PO TABS: 5.0000 mg | ORAL_TABLET | ORAL | Status: DC | PRN

## 2022-09-23 MED ORDER — PROPOFOL 10 MG/ML IV BOLUS
INTRAVENOUS | Status: AC
  Filled 2022-09-23: qty 20

## 2022-09-23 MED ORDER — EPHEDRINE 5 MG/ML INJ
INTRAVENOUS | Status: AC
  Filled 2022-09-23: qty 5

## 2022-09-23 MED ORDER — HYDROMORPHONE HCL 1 MG/ML IJ SOLN
INTRAMUSCULAR | Status: AC
  Filled 2022-09-23: qty 1

## 2022-09-23 MED ORDER — HYDROMORPHONE HCL 1 MG/ML IJ SOLN: 0.5000 mg | INTRAMUSCULAR | Status: DC | PRN

## 2022-09-23 MED ORDER — WARFARIN - PHARMACIST DOSING INPATIENT: Freq: Every day | Status: DC

## 2022-09-23 MED ORDER — METHOCARBAMOL 1000 MG/10ML IJ SOLN: 500.0000 mg | Freq: Four times a day (QID) | INTRAVENOUS | Status: DC | PRN

## 2022-09-23 MED ORDER — PHENOL 1.4 % MT LIQD: 1.0000 | OROMUCOSAL | Status: DC | PRN

## 2022-09-23 MED ORDER — SENNOSIDES-DOCUSATE SODIUM 8.6-50 MG PO TABS
2.0000 | ORAL_TABLET | Freq: Every day | ORAL | Status: DC
  Administered 2022-09-23 – 2022-09-28 (×6): 2 via ORAL
  Filled 2022-09-23 (×5): qty 2

## 2022-09-23 MED ORDER — SORBITOL 70 % SOLN
30.0000 mL | Freq: Every day | Status: DC | PRN
  Filled 2022-09-23: qty 30

## 2022-09-23 MED ORDER — PHENYLEPHRINE 80 MCG/ML (10ML) SYRINGE FOR IV PUSH (FOR BLOOD PRESSURE SUPPORT)
PREFILLED_SYRINGE | INTRAVENOUS | Status: AC
  Filled 2022-09-23: qty 10

## 2022-09-23 MED ORDER — ONDANSETRON HCL 4 MG PO TABS: 4.0000 mg | ORAL_TABLET | Freq: Four times a day (QID) | ORAL | Status: DC | PRN

## 2022-09-23 MED ORDER — DOCUSATE SODIUM 100 MG PO CAPS
100.0000 mg | ORAL_CAPSULE | Freq: Two times a day (BID) | ORAL | Status: DC
  Administered 2022-09-23 – 2022-09-24 (×2): 100 mg via ORAL
  Filled 2022-09-23 (×2): qty 1

## 2022-09-23 MED ORDER — POVIDONE-IODINE 10 % EX SWAB
2.0000 | Freq: Once | CUTANEOUS | Status: AC
  Administered 2022-09-23: 2 via TOPICAL

## 2022-09-23 MED ORDER — CHLORHEXIDINE GLUCONATE 0.12 % MT SOLN
15.0000 mL | Freq: Once | OROMUCOSAL | Status: AC
  Administered 2022-09-23: 15 mL via OROMUCOSAL

## 2022-09-23 MED ORDER — PHENYLEPHRINE HCL-NACL 20-0.9 MG/250ML-% IV SOLN
INTRAVENOUS | Status: DC | PRN
  Administered 2022-09-23: 50 ug/min via INTRAVENOUS

## 2022-09-23 MED ORDER — POLYETHYLENE GLYCOL 3350 17 G PO PACK
17.0000 g | PACK | Freq: Every day | ORAL | Status: DC | PRN
  Administered 2022-09-23: 17 g via ORAL
  Filled 2022-09-23: qty 1

## 2022-09-23 MED ORDER — HEPARIN (PORCINE) 25000 UT/250ML-% IV SOLN
1050.0000 [IU]/h | INTRAVENOUS | Status: DC
  Administered 2022-09-24: 1050 [IU]/h via INTRAVENOUS
  Filled 2022-09-23 (×2): qty 250

## 2022-09-23 MED ORDER — OXYCODONE HCL 5 MG PO TABS: 10.0000 mg | ORAL_TABLET | ORAL | Status: DC | PRN

## 2022-09-23 MED ORDER — ROCURONIUM BROMIDE 10 MG/ML (PF) SYRINGE
PREFILLED_SYRINGE | INTRAVENOUS | Status: DC | PRN
  Administered 2022-09-23: 50 mg via INTRAVENOUS

## 2022-09-23 MED ORDER — LIDOCAINE 2% (20 MG/ML) 5 ML SYRINGE
INTRAMUSCULAR | Status: AC
  Filled 2022-09-23: qty 5

## 2022-09-23 MED ORDER — LIDOCAINE 2% (20 MG/ML) 5 ML SYRINGE
INTRAMUSCULAR | Status: DC | PRN
  Administered 2022-09-23: 30 mg via INTRAVENOUS

## 2022-09-23 MED ORDER — HYDROMORPHONE HCL 1 MG/ML IJ SOLN
0.2500 mg | INTRAMUSCULAR | Status: DC | PRN
  Administered 2022-09-23: 0.5 mg via INTRAVENOUS
  Administered 2022-09-23 (×2): 0.25 mg via INTRAVENOUS

## 2022-09-23 MED ORDER — ONDANSETRON HCL 4 MG/2ML IJ SOLN
INTRAMUSCULAR | Status: DC | PRN
  Administered 2022-09-23: 4 mg via INTRAVENOUS

## 2022-09-23 MED ORDER — HYDROMORPHONE HCL 1 MG/ML IJ SOLN
INTRAMUSCULAR | Status: AC
  Filled 2022-09-23: qty 0.5

## 2022-09-23 MED ORDER — TRANEXAMIC ACID 1000 MG/10ML IV SOLN
2000.0000 mg | Freq: Once | INTRAVENOUS | Status: AC
  Administered 2022-09-23: 2000 mg via TOPICAL
  Filled 2022-09-23: qty 10

## 2022-09-23 MED ORDER — PROMETHAZINE HCL 25 MG/ML IJ SOLN: 6.2500 mg | INTRAMUSCULAR | Status: DC | PRN

## 2022-09-23 MED ORDER — ACETAMINOPHEN 325 MG PO TABS
325.0000 mg | ORAL_TABLET | Freq: Four times a day (QID) | ORAL | Status: DC | PRN
  Administered 2022-09-24 – 2022-09-27 (×4): 650 mg via ORAL
  Filled 2022-09-23 (×5): qty 2

## 2022-09-23 MED ORDER — 0.9 % SODIUM CHLORIDE (POUR BTL) OPTIME
TOPICAL | Status: DC | PRN
  Administered 2022-09-23: 1000 mL

## 2022-09-23 MED ORDER — MAGNESIUM CITRATE PO SOLN: 1.0000 | Freq: Once | ORAL | Status: DC | PRN

## 2022-09-23 MED ORDER — MENTHOL 3 MG MT LOZG: 1.0000 | LOZENGE | OROMUCOSAL | Status: DC | PRN

## 2022-09-23 MED ORDER — ALUM & MAG HYDROXIDE-SIMETH 200-200-20 MG/5ML PO SUSP
30.0000 mL | ORAL | Status: DC | PRN
  Administered 2022-09-24 – 2022-09-25 (×3): 30 mL via ORAL
  Filled 2022-09-23 (×3): qty 30

## 2022-09-23 MED ORDER — MIDAZOLAM HCL 2 MG/2ML IJ SOLN: 0.5000 mg | Freq: Once | INTRAMUSCULAR | Status: DC | PRN

## 2022-09-23 MED ORDER — WARFARIN SODIUM 7.5 MG PO TABS
7.5000 mg | ORAL_TABLET | Freq: Once | ORAL | Status: AC
  Administered 2022-09-23: 7.5 mg via ORAL
  Filled 2022-09-23 (×2): qty 1

## 2022-09-23 MED ORDER — PROPOFOL 10 MG/ML IV BOLUS
INTRAVENOUS | Status: DC | PRN
  Administered 2022-09-23: 100 mg via INTRAVENOUS
  Administered 2022-09-23: 30 mg via INTRAVENOUS

## 2022-09-23 MED ORDER — ORAL CARE MOUTH RINSE: 15.0000 mL | Freq: Once | OROMUCOSAL | Status: AC

## 2022-09-23 MED ORDER — CHLORHEXIDINE GLUCONATE 4 % EX LIQD
60.0000 mL | Freq: Once | CUTANEOUS | Status: AC
  Administered 2022-09-23: 4 via TOPICAL
  Filled 2022-09-23: qty 60

## 2022-09-23 MED ORDER — FENTANYL CITRATE (PF) 250 MCG/5ML IJ SOLN
INTRAMUSCULAR | Status: AC
  Filled 2022-09-23: qty 5

## 2022-09-23 MED ORDER — SODIUM CHLORIDE 0.9 % IV SOLN: INTRAVENOUS | Status: DC

## 2022-09-23 MED ORDER — METHOCARBAMOL 500 MG PO TABS
500.0000 mg | ORAL_TABLET | Freq: Four times a day (QID) | ORAL | Status: DC | PRN
  Administered 2022-09-23 – 2022-09-28 (×10): 500 mg via ORAL
  Filled 2022-09-23 (×11): qty 1

## 2022-09-23 MED ORDER — PHENYLEPHRINE 80 MCG/ML (10ML) SYRINGE FOR IV PUSH (FOR BLOOD PRESSURE SUPPORT)
PREFILLED_SYRINGE | INTRAVENOUS | Status: DC | PRN
  Administered 2022-09-23 (×2): 160 ug via INTRAVENOUS

## 2022-09-23 MED ORDER — TRANEXAMIC ACID-NACL 1000-0.7 MG/100ML-% IV SOLN
1000.0000 mg | Freq: Once | INTRAVENOUS | Status: AC
  Administered 2022-09-23: 1000 mg via INTRAVENOUS
  Filled 2022-09-23: qty 100

## 2022-09-23 MED ORDER — DEXAMETHASONE SODIUM PHOSPHATE 10 MG/ML IJ SOLN
INTRAMUSCULAR | Status: DC | PRN
  Administered 2022-09-23: 5 mg via INTRAVENOUS

## 2022-09-23 MED ORDER — FENTANYL CITRATE (PF) 250 MCG/5ML IJ SOLN
INTRAMUSCULAR | Status: DC | PRN
  Administered 2022-09-23: 50 ug via INTRAVENOUS
  Administered 2022-09-23: 100 ug via INTRAVENOUS
  Administered 2022-09-23: 50 ug via INTRAVENOUS

## 2022-09-23 SURGICAL SUPPLY — 50 items
BAG COUNTER SPONGE SURGICOUNT (BAG) ×1 IMPLANT
BIT DRILL INTERTAN LAG SCREW (BIT) IMPLANT
BIT DRILL SHORT 4.0 (BIT) IMPLANT
BNDG COHESIVE 4X5 TAN STRL (GAUZE/BANDAGES/DRESSINGS) ×1 IMPLANT
BNDG COHESIVE 6X5 TAN ST LF (GAUZE/BANDAGES/DRESSINGS) IMPLANT
BNDG GAUZE DERMACEA FLUFF 4 (GAUZE/BANDAGES/DRESSINGS) ×1 IMPLANT
COVER PERINEAL POST (MISCELLANEOUS) ×1 IMPLANT
COVER SURGICAL LIGHT HANDLE (MISCELLANEOUS) ×1 IMPLANT
DRAPE C-ARMOR (DRAPES) ×1 IMPLANT
DRAPE STERI IOBAN 125X83 (DRAPES) ×1 IMPLANT
DRESSING MEPILEX FLEX 4X4 (GAUZE/BANDAGES/DRESSINGS) ×1 IMPLANT
DRILL BIT SHORT 4.0 (BIT) ×1
DRSG MEPILEX FLEX 4X4 (GAUZE/BANDAGES/DRESSINGS) ×3
DRSG MEPILEX POST OP 4X8 (GAUZE/BANDAGES/DRESSINGS) ×1 IMPLANT
DURAPREP 26ML APPLICATOR (WOUND CARE) ×1 IMPLANT
ELECT REM PT RETURN 9FT ADLT (ELECTROSURGICAL) ×1
ELECTRODE REM PT RTRN 9FT ADLT (ELECTROSURGICAL) ×1 IMPLANT
GAUZE PAD ABD 8X10 STRL (GAUZE/BANDAGES/DRESSINGS) ×2 IMPLANT
GLOVE BIOGEL PI IND STRL 7.0 (GLOVE) ×2 IMPLANT
GLOVE BIOGEL PI IND STRL 7.5 (GLOVE) ×1 IMPLANT
GLOVE ECLIPSE 7.0 STRL STRAW (GLOVE) ×1 IMPLANT
GLOVE SKINSENSE STRL SZ7.5 (GLOVE) ×2 IMPLANT
GLOVE SURG SYN 7.5  E (GLOVE) ×2
GLOVE SURG SYN 7.5 E (GLOVE) ×2 IMPLANT
GLOVE SURG SYN 7.5 PF PI (GLOVE) ×2 IMPLANT
GLOVE SURG UNDER POLY LF SZ7 (GLOVE) ×19 IMPLANT
GLOVE SURG UNDER POLY LF SZ7.5 (GLOVE) ×4 IMPLANT
GOWN STRL SURGICAL XL XLNG (GOWN DISPOSABLE) ×1 IMPLANT
GUIDE PIN 3.2X343 (PIN) ×2
GUIDE PIN 3.2X343MM (PIN) ×2
KIT BASIN OR (CUSTOM PROCEDURE TRAY) ×1 IMPLANT
KIT TURNOVER KIT B (KITS) ×1 IMPLANT
MANIFOLD NEPTUNE II (INSTRUMENTS) ×1 IMPLANT
NAIL TRIGEN 10MMX36CM-125 RT (Nail) IMPLANT
NS IRRIG 1000ML POUR BTL (IV SOLUTION) ×1 IMPLANT
PACK GENERAL/GYN (CUSTOM PROCEDURE TRAY) ×1 IMPLANT
PAD ARMBOARD 7.5X6 YLW CONV (MISCELLANEOUS) ×2 IMPLANT
PAD CAST 4YDX4 CTTN HI CHSV (CAST SUPPLIES) ×2 IMPLANT
PADDING CAST COTTON 4X4 STRL (CAST SUPPLIES)
PIN GUIDE 3.2X343MM (PIN) IMPLANT
SCREW LAG COMPR KIT 95/90 (Screw) IMPLANT
SCREW TRIGEN LOW PROF 5.0X32.5 (Screw) IMPLANT
STAPLER VISISTAT 35W (STAPLE) ×1 IMPLANT
SUT VIC AB 0 CT1 27 (SUTURE) ×1
SUT VIC AB 0 CT1 27XBRD ANBCTR (SUTURE) ×1 IMPLANT
SUT VIC AB 2-0 CT1 27 (SUTURE) ×1
SUT VIC AB 2-0 CT1 TAPERPNT 27 (SUTURE) ×1 IMPLANT
TOWEL GREEN STERILE (TOWEL DISPOSABLE) ×1 IMPLANT
TOWEL GREEN STERILE FF (TOWEL DISPOSABLE) ×1 IMPLANT
WATER STERILE IRR 1000ML POUR (IV SOLUTION) ×1 IMPLANT

## 2022-09-23 NOTE — Anesthesia Preprocedure Evaluation (Addendum)
Anesthesia Evaluation  Patient identified by [name], date of birth, ID band Patient awake    Reviewed: Allergy & Precautions, NPO status , Patient's Chart, lab work & pertinent test results, reviewed documented beta blocker date and time   History of Anesthesia Complications Negative for: history of anesthetic complications  Airway Mallampati: I  TM Distance: >3 FB Neck ROM: Full    Dental  (+) Edentulous Upper, Edentulous Lower   Pulmonary COPD,  COPD inhaler, Current Smoker and Patient abstained from smoking., PE   breath sounds clear to auscultation       Cardiovascular hypertension, Pt. on medications and Pt. on home beta blockers (-) angina + CAD (single vessel RCA disease)  + dysrhythmias Atrial Fibrillation + Valvular Problems/Murmurs (h/o mechanical MVR)  Rhythm:Regular Rate:Normal  04/2022 ECHO: EF 35-40%. 1. The LV has moderately decreased function, global hypokinesis.  2. RVF is severely reduced. The right ventricular size is severely enlarged. There is moderately elevated pulmonary artery systolic pressure, [id number] mmHg.  3. Left atrial size was moderately dilated.  4. Right atrial size was severely dilated.  5. The mitral valve has been repaired/replaced. No evidence of mitral  valve regurgitation. The mean mitral valve gradient is 3.0 mmHg. There is  a mechanical valve present in the mitral position. Procedure Date: 2001.  6. Tricuspid valve regurgitation is severe.  7. The aortic valve is grossly normal. No AI, no AS.    Neuro/Psych  Headaches    GI/Hepatic Neg liver ROS,GERD  Medicated and Controlled,,  Endo/Other  negative endocrine ROS    Renal/GU negative Renal ROS     Musculoskeletal   Abdominal   Peds  Hematology Coumadin:  INR 1.2   Anesthesia Other Findings H/o breast cancer  Reproductive/Obstetrics                             Anesthesia Physical Anesthesia Plan  ASA:  3  Anesthesia Plan: General   Post-op Pain Management: Tylenol PO (pre-op)*   Induction: Intravenous  PONV Risk Score and Plan: 2 and Ondansetron and Dexamethasone  Airway Management Planned: Oral ETT  Additional Equipment: None  Intra-op Plan:   Post-operative Plan: Extubation in OR  Informed Consent: I have reviewed the patients History and Physical, chart, labs and discussed the procedure including the risks, benefits and alternatives for the proposed anesthesia with the patient or authorized representative who has indicated his/her understanding and acceptance.     Dental advisory given  Plan Discussed with: CRNA and Surgeon  Anesthesia Plan Comments:         Anesthesia Quick Evaluation

## 2022-09-23 NOTE — Progress Notes (Signed)
Kim Anthony for warfarin/heparin Indication: Hx PE/DVT, mechanical MVR, Afib  Allergies  Allergen Reactions   Lisinopril Swelling   Acetaminophen-Codeine Itching   Propoxyphene Itching    Darvocet   Tape Rash    Plastic   Tramadol Itching, Nausea And Vomiting and Rash    Patient Measurements: Height: 5\' 5"  (165.1 cm) Weight: 69.9 kg (154 lb) IBW/kg (Calculated) : 57   Vital Signs: Temp: 99.5 F (37.5 C) (04/03 1453) Temp Source: Oral (04/03 1232) BP: 131/95 (04/03 1500) Pulse Rate: 99 (04/03 1500)  Labs: Recent Labs    09/21/22 0415 09/21/22 0503 09/22/22 0842 09/22/22 1840 09/22/22 2030 09/23/22 0703  HGB 13.4  --  11.6*  --   --  11.0*  HCT 42.3  --  35.5*  --   --  34.2*  PLT 329  --  256  --   --  236  APTT  --   --   --   --  39*  --   LABPROT  --    < > 29.2* 18.6*  --  14.7  INR  --    < > 2.8* 1.6*  --  1.2  CREATININE 1.04*  --  1.09*  --   --  0.90   < > = values in this interval not displayed.     Estimated Creatinine Clearance: 55.5 mL/min (by C-G formula based on SCr of 0.9 mg/dL).   Medical History: Past Medical History:  Diagnosis Date   Arthritis    Asthma    Breast cancer 2001   Left Breast Cancer   CHF (congestive heart failure)    COPD (chronic obstructive pulmonary disease)    Coronary artery disease    GERD (gastroesophageal reflux disease)    History of mitral valve replacement with mechanical valve    HLD (hyperlipidemia)    Hypertension    Pulmonary embolism 2021    Assessment: 1 YOF presenting with R hip fracture 2/2 fall on warfarin PTA with INR on admission 3.3. Pt has a history of PE/DVT, mechanical MVR, and Afib and was taking warfarin 5mg  po daily prior to admission. Last dose of warfarin was taken on 09/20/22 at 17:30. Plan to hold warfarin for surgery. Pharmacy consulted to initiate heparin bridging once INR < 2.5  Pt is s/p ortho surgical repair for hip fx. Ok to resume  coumadin today and heparin at 0300 tomorrow morning. INR likely to be resistant due to vitamin K.   Goal of Therapy:  INR 2.5-3.5 Heparin level 0.3-0.7 units/ml Monitor platelets by anticoagulation protocol: Yes   Plan:  Coumadin 7.5mg  PO x1 Start heparin bridge 1050 units/hr at 0300 Check 8 hr  Daily INR/HL  Onnie Boer, PharmD, [place], AAHIVP, CPP Infectious Disease Pharmacist 09/23/2022 3:34 PM

## 2022-09-23 NOTE — Transfer of Care (Signed)
Immediate Anesthesia Transfer of Care Note  Patient: Kim Anthony  Procedure(s) Performed: INTRAMEDULLARY (IM) NAIL INTERTROCHANTERIC  Patient Location: PACU  Anesthesia Type:General  Level of Consciousness: awake and confused  Airway & Oxygen Therapy: Patient Spontanous Breathing, Patient connected to nasal cannula oxygen, and Patient connected to face mask oxygen  Post-op Assessment: Report given to RN and Post -op Vital signs reviewed and stable  Post vital signs: Reviewed and stable  Last Vitals:  Vitals Value Taken Time  BP 118/73 09/23/22 1453  Temp    Pulse 102 09/23/22 1458  Resp 25 09/23/22 1458  SpO2 95 % 09/23/22 1458  Vitals shown include unvalidated device data.  Last Pain:  Vitals:   09/23/22 1246  TempSrc:   PainSc: 8          Complications: No notable events documented.

## 2022-09-23 NOTE — Anesthesia Postprocedure Evaluation (Signed)
Anesthesia Post Note  Patient: SRUSHTI BERMAN  Procedure(s) Performed: INTRAMEDULLARY (IM) NAIL INTERTROCHANTERIC     Patient location during evaluation: PACU Anesthesia Type: General Level of consciousness: awake and alert, patient cooperative and oriented Pain management: pain level controlled Vital Signs Assessment: post-procedure vital signs reviewed and stable Respiratory status: spontaneous breathing, nonlabored ventilation and respiratory function stable Cardiovascular status: blood pressure returned to baseline and stable Postop Assessment: no apparent nausea or vomiting Anesthetic complications: no   No notable events documented.  Last Vitals:  Vitals:   09/23/22 1545 09/23/22 1603  BP: (!) 146/80 (!) 147/88  Pulse: 99 98  Resp: 17 18  Temp:  36.9 C  SpO2: 98% 99%    Last Pain:  Vitals:   09/23/22 1603  TempSrc: Oral  PainSc:                  Breland Elders,E. Juanito Gonyer

## 2022-09-23 NOTE — Op Note (Addendum)
Date of Surgery: 09/23/2022  INDICATIONS: [name] is a 72 y.o.-year-old female who sustained a right hip fracture. The risks and benefits of the procedure discussed with the patient prior to the procedure and all questions were answered; consent was obtained.  PREOPERATIVE DIAGNOSIS: right basicervical/intertrochanteric hip fracture   POSTOPERATIVE DIAGNOSIS: Same   PROCEDURE: Open treatment of intertrochanteric, pertrochanteric, subtrochanteric fracture with intramedullary implant. CPT [phone]   SURGEON: N. Eduard Roux, M.D.   ASSIST: Madalyn Rob, [place]  ANESTHESIA: general   IV FLUIDS AND URINE: See anesthesia record   ESTIMATED BLOOD LOSS: 75 cc  IMPLANTS: [employer] InterTAN 10 x 36 cm, 95/90 compression screws, 1 distal interlocking screw  DRAINS: None.   COMPLICATIONS: see description of procedure.   DESCRIPTION OF PROCEDURE: The patient was brought to the operating room and placed supine on the operating table. The patient's leg had been signed prior to the procedure. The patient had the anesthesia placed by the anesthesiologist. The prep verification and incision time-outs were performed to confirm that this was the correct patient, site, side and location. The patient had an SCD on the opposite lower extremity. The patient did receive antibiotics prior to the incision and was re-dosed during the procedure as needed at indicated intervals. The patient was positioned on the fracture table with the table in traction and internal rotation to reduce the hip. The well leg was placed in a scissor position and all bony prominences were well-padded. The patient had the lower extremity prepped and draped in the standard surgical fashion. The incision was made 4 finger breadths superior to the greater trochanter. A guide pin was inserted into the tip of the greater trochanter under fluoroscopic guidance. An opening reamer was used to gain access to the femoral canal. The nail  length was measured and inserted down the femoral canal to its proper depth. The appropriate version of insertion for the lag screw was found under fluoroscopy. A pin was inserted up the femoral neck through the jig. Then, a second antirotation pin was inserted inferior to the first pin. The length of the lag screw was then measured. The lag screw was inserted as near to center-center in the head as possible. The antirotation pin was then taken out and an interdigitating compression screw was placed in its place. The leg was taken out of traction, then the interdigitating compression screw was used to compress across the fracture. Compression was visualized on serial xrays.  A distal interlocking screw was placed using the perfect circle technique.  The wound was copiously irrigated with saline and the subcutaneous layer closed with 2.0 vicryl and the skin was reapproximated with staples. The wounds were cleaned and dried a final time and a sterile dressing was placed. The hip was taken through a range of motion at the end of the case under fluoroscopic imaging to visualize the approach-withdraw phenomenon and confirm implant length in the head. The patient was then awakened from anesthesia and taken to the recovery room in stable condition. All counts were correct at the end of the case.   Tawanna Cooler was necessary for opening, closing, retracting, limb positioning and overall facilitation and completion of the surgery.  POSTOPERATIVE PLAN: The patient will be weight bearing as tolerated.  She will mobilize with PT tomorrow morning.  Her regular dose of warfarin can be restarted this evening.  IV heparin bridge may resume 12 hours after surgery which would be approximately be a 3 am tonight.  Azucena Cecil, MD [facility] 2:32 PM

## 2022-09-23 NOTE — Consult Note (Signed)
ORTHOPAEDIC CONSULTATION  REQUESTING PHYSICIAN: Lottie Mussel, MD  Chief Complaint: Right intertrochanteric hip fracture  HPI: 72 year old female with past medical history significant for DVT and PE complains of right hip pain since a fall at home.  She got out of bed to smoke a cigarette.  She turned and fell landing on her right side.  She complains of severe pain in the right hip.  Pain is worse with any motion and better with lying still.  She is a smoker.  She takes Coumadin.  Surgery has been delayed due to elevated INR and due to surgeon availability and scheduling, I was consulted to perform the surgery.    Past Medical History:  Diagnosis Date   Arthritis    Asthma    Breast cancer ([place]) 2001   Left Breast Cancer   CHF (congestive heart failure) (HCC)    COPD (chronic obstructive pulmonary disease) (HCC)    Coronary artery disease    GERD (gastroesophageal reflux disease)    History of mitral valve replacement with mechanical valve    HLD (hyperlipidemia)    Hypertension    Pulmonary embolism ([place]) 2021   Past Surgical History:  Procedure Laterality Date   APPLICATION OF A-CELL OF CHEST/ABDOMEN Left 01/27/2019   Procedure: APPLICATION OF A-CELL OF CHEST;  Surgeon: Wallace Going, DO;  Location: [place];  Service: Plastics;  Laterality: Left;   APPLICATION OF WOUND VAC N/A 01/27/2019   Procedure: APPLICATION OF WOUND VAC;  Surgeon: Wallace Going, DO;  Location: [place];  Service: Plastics;  Laterality: N/A;   BREAST SURGERY Left 2001   for CA   CARDIOVERSION N/A 02/26/2022   Procedure: CARDIOVERSION;  Surgeon: Donato Heinz, MD;  Location: [place];  Service: Cardiovascular;  Laterality: N/A;   COLONOSCOPY WITH PROPOFOL N/A 06/07/2018   Procedure: COLONOSCOPY WITH PROPOFOL;  Surgeon: Wilford Corner, MD;  Location: [facility];  Service: Endoscopy;  Laterality: N/A;   DEBRIDEMENT AND CLOSURE WOUND Left 12/28/2018   Procedure: Debridement And  Closure Wound;  Surgeon: Coralie Keens, MD;  Location: [place];  Service: General;  Laterality: Left;   INCISION AND DRAINAGE OF WOUND Left 01/27/2019   Procedure: IRRIGATION AND DEBRIDEMENT OF CHEST WALL;  Surgeon: Wallace Going, DO;  Location: [place];  Service: Plastics;  Laterality: Left;   KNEE ARTHROSCOPY WITH MEDIAL MENISECTOMY Left 08/06/2021   Procedure: KNEE ARTHROSCOPY WITH PARTIAL MEDIAL MENISECTOMY, DEBRIDEMENT;  Surgeon: Susa Day, MD;  Location: [facility];  Service: Orthopedics;  Laterality: Left;  1 HR   LATISSIMUS FLAP TO BREAST Left 01/18/2019   Procedure: LEFT LATISSIMUS FLAP TO BREAST;  Surgeon: Wallace Going, DO;  Location: [place];  Service: Plastics;  Laterality: Left;   MASTECTOMY Left    MITRAL VALVE REPLACEMENT     mechanical   POLYPECTOMY  06/07/2018   Procedure: POLYPECTOMY;  Surgeon: Wilford Corner, MD;  Location[facility];  Service: Endoscopy;;   RIGHT/LEFT HEART CATH AND CORONARY ANGIOGRAPHY N/A 01/09/2021   Procedure: RIGHT/LEFT HEART CATH AND CORONARY ANGIOGRAPHY;  Surgeon: Jolaine Artist, MD;  Location: [facility];  Service: Cardiovascular;  Laterality: N/A;   WOUND DEBRIDEMENT Left 12/28/2018   Procedure: EXCISION OF LEFT CHRONIC CHEST WALL WOUND;  Surgeon: Coralie Keens, MD;  Location: [facility];  Service: General;  Laterality: Left;   Social History   Socioeconomic History   Marital status: Single    Spouse name: Not on file   Number of children: Not on file  Years of education: Not on file   Highest education level: Not on file  Occupational History   Not on file  Tobacco Use   Smoking status: Every Day    Packs/day: 0.10    Years: 30.00    Additional pack years: 0.00    Total pack years: 3.00    Types: Cigarettes   Smokeless tobacco: Never   Tobacco comments:    5 cigs per day  Vaping Use   Vaping Use: Never used  Substance and Sexual Activity   Alcohol use: Yes    Alcohol/week: 3.0 standard drinks of  alcohol    Types: 3 Standard drinks or equivalent per week    Comment: beer- ocasional   Drug use: No   Sexual activity: Not Currently    Birth control/protection: None  Other Topics Concern   Not on file  Social History Narrative   ** Merged History Encounter **       Social Determinants of Health   Financial Resource Strain: Medium Risk (02/27/2021)   Overall Financial Resource Strain (CARDIA)    Difficulty of Paying Living Expenses: Somewhat hard  Food Insecurity: No Food Insecurity (09/21/2022)   Hunger Vital Sign    Worried About Running Out of Food in the Last Year: Never true    Ran Out of Food in the Last Year: Never true  Transportation Needs: No Transportation Needs (09/21/2022)   PRAPARE - Hydrologist (Medical): No    Lack of Transportation (Non-Medical): No  Physical Activity: Not on file  Stress: Not on file  Social Connections: Socially Isolated (11/28/2019)   Social Connection and Isolation Panel [NHANES]    Frequency of Communication with Friends and Family: More than three times a week    Frequency of Social Gatherings with Friends and Family: More than three times a week    Attends Religious Services: Never    Marine scientist or Organizations: No    Attends Music therapist: Not on file    Marital Status: Never married   Family History  Problem Relation Age of Onset   Hypertension Mother    Cancer Father    Hypertension Father    Breast cancer Maternal Aunt 50   Heart attack Other    Allergies  Allergen Reactions   Lisinopril Swelling   Acetaminophen-Codeine Itching   Propoxyphene Itching    Darvocet   Tape Rash    Plastic   Tramadol Itching, Nausea And Vomiting and Rash   Prior to Admission medications   Medication Sig Start Date End Date Taking? Authorizing Provider  albuterol (VENTOLIN HFA) 108 (90 Base) MCG/ACT inhaler Inhale 2 puffs into the lungs every 6 (six) hours as needed for wheezing or  shortness of breath. 07/02/22  Yes Rick Duff, MD  diphenhydrAMINE (BENADRYL) 25 MG tablet Take 50-75 mg by mouth daily as needed for allergies.   Yes [provider]  DULoxetine (CYMBALTA) 20 MG capsule Take 20 mg by mouth at bedtime.   Yes [provider]  famotidine (PEPCID) 20 MG tablet Take 1 tablet (20 mg total) by mouth 2 (two) times daily. 06/03/22  Yes Iona Coach, MD  FARXIGA 10 MG TABS tablet Take 1 tablet by mouth once daily 02/09/22  Yes Idamae Schuller, MD  fluticasone [facility]) 50 MCG/ACT nasal spray Place 1 spray into both nostrils daily as needed for allergies. Patient taking differently: Place 2 sprays into both nostrils daily. 03/02/22 03/02/23 Yes Nani Gasser,  MD  Fluticasone-Umeclidin-Vilant (TRELEGY ELLIPTA) 100-62.5-25 MCG/ACT AEPB Inhale 1 Inhalation into the lungs daily. 08/29/22  Yes Delene Ruffini, MD  gabapentin (NEURONTIN) 600 MG tablet TAKE 1 & 1/2 (ONE & ONE-HALF) TABLETS BY MOUTH THREE TIMES DAILY Patient taking differently: Take 900 mg by mouth 3 (three) times daily. 07/27/22  Yes Iona Coach, MD  HYDROcodone-acetaminophen [facility]) 5-325 MG tablet Take 1 tablet by mouth every 4 (four) hours as needed for moderate pain. 09/02/22  Yes Lovorn, Jinny Blossom, MD  losartan (COZAAR) 25 MG tablet Take 0.5 tablets (12.5 mg total) by mouth daily. 04/10/22 01/05/23 Yes Milford, Maricela Bo, FNP  metoprolol succinate (TOPROL-XL) 25 MG 24 hr tablet Take 1 tablet (25 mg total) by mouth daily. 06/03/22  Yes Iona Coach, MD  potassium chloride SA (KLOR-CON M) 20 MEQ tablet Take 2 tablets (40 mEq total) by mouth 2 (two) times daily. Patient taking differently: Take 20 mEq by mouth 2 (two) times daily. 05/13/22  Yes Milford, Maricela Bo, FNP  simvastatin (ZOCOR) 20 MG tablet Take 1 tablet (20 mg total) by mouth daily. 03/02/22  Yes Nani Gasser, MD  torsemide (DEMADEX) 20 MG tablet Take 2 tablets (40 mg total) by mouth daily. 05/13/22  Yes Milford, Maricela Bo, FNP   varenicline (CHANTIX) 0.5 MG tablet Take 1 tablet (0.5 mg total) by mouth 2 (two) times daily. 08/29/22  Yes Delene Ruffini, MD  warfarin (COUMADIN) 5 MG tablet Take 1 tablet (5 mg total) by mouth daily at 4 PM. 09/10/22  Yes Pennie Banter, RPH-CPP  amiodarone (PACERONE) 200 MG tablet Take 1 tablet (200 mg total) by mouth daily. Patient not taking: Reported on 08/27/2022 03/13/22   Rafael Bihari, FNP  pantoprazole (PROTONIX) 40 MG tablet Take 1 tablet (40 mg total) by mouth daily. 09/20/22   Iona Coach, MD  Spacer/Aero-Holding Chambers (BREATHERITE COLL SPACER ADULT) MISC 1 applicator by Does not apply route daily. 01/11/20   [place], Nila Nephew D, DO  spironolactone (ALDACTONE) 25 MG tablet Take 1 tablet by mouth once daily Patient not taking: Reported on 08/27/2022 11/19/21   Bensimhon, Shaune Pascal, MD   No results found.  All pertinent xrays, MRI, CT independently reviewed and interpreted  Positive ROS: All other systems have been reviewed and were otherwise negative with the exception of those mentioned in the HPI and as above.  Physical Exam: General: No acute distress Cardiovascular: No pedal edema Respiratory: No cyanosis, no use of accessory musculature GI: No organomegaly, abdomen is soft and non-tender Skin: No lesions in the area of chief complaint Neurologic: Sensation intact distally Psychiatric: Patient is at baseline mood and affect Lymphatic: No axillary or cervical lymphadenopathy  MUSCULOSKELETAL:  - severe pain with movement of the hip and extremity - skin intact - NVI distally - compartments soft  Assessment: Right IT hip fracture  Plan: - surgical treatment is recommended for pain relief, quality of life and early mobilization - patient and family are aware of r/b/a and wish to proceed, informed consent obtained - medical optimization per primary team - surgery is planned for today at 1:30 pm - surgical consent has been modified to reflect my name and  initiailed by all parties  Thank you for the consult and the opportunity to see Ms. Marjory Sneddon Eduard Roux, MD [facility] 8:07 AM

## 2022-09-23 NOTE — Anesthesia Procedure Notes (Addendum)
Procedure Name: Intubation Date/Time: 09/23/2022 1:38 PM  Performed by: Reggie Pile, CRNAPre-anesthesia Checklist: Patient identified, Emergency Drugs available, Suction available and Patient being monitored Patient Re-evaluated:Patient Re-evaluated prior to induction Oxygen Delivery Method: Circle system utilized Preoxygenation: Pre-oxygenation with 100% oxygen Induction Type: IV induction Ventilation: Mask ventilation without difficulty Laryngoscope Size: Miller and 2 Grade View: Grade I Tube type: Oral Tube size: 7.0 mm Number of attempts: 1 Airway Equipment and Method: Stylet and Oral airway Placement Confirmation: ETT inserted through vocal cords under direct vision, positive ETCO2 and breath sounds checked- equal and bilateral Secured at: 22 cm Tube secured with: Tape Dental Injury: Teeth and Oropharynx as per pre-operative assessment

## 2022-09-23 NOTE — H&P (Signed)

## 2022-09-23 NOTE — Discharge Instructions (Signed)
    1. Change dressings as needed 2. May shower but keep incisions covered and dry 4. Take stool softeners as needed 5. Take pain meds as needed

## 2022-09-23 NOTE — Progress Notes (Signed)
Patient on PCA pump, and has PRN dose oxycodone.  Confirmed with Stann Mainland MD that these two medications should not be given together.  PRN order still in the system.

## 2022-09-23 NOTE — Progress Notes (Signed)
Subjective: Having 8/10 pain. No CP, SOB, dizzy, or lightheaded. No abdominal pain. She is trying to have a bowel movement. She states that her breathing is okay. She states that other than the leg pain, everything else is good.   Objective:  Vital signs in last 24 hours: Vitals:   09/23/22 0800 09/23/22 0825 09/23/22 1205 09/23/22 1232  BP:  (!) 116/57  122/79  Pulse:  (!) 103  99  Resp: (!) 26  16 18   Temp:  97.9 F (36.6 C)  98.8 F (37.1 C)  TempSrc:  Oral  Oral  SpO2: 93% 98%  99%  Weight:    69.9 kg  Height:    5\' 5"  (1.651 m)   Gen: chronically ill appearing, in pain, laying in bed cooperative CV:RRR, no m/r/g, audible mechanical mitral valve, 2+ bilateral radial pulses Pulm: normal wob on RA, LCTAB Extr: warm, dry, no LE edema  Assessment/Plan:  Active Problems:   Chronic combined systolic and diastolic heart failure   HLD (hyperlipidemia)   Essential hypertension   Tobacco use disorder   COPD (chronic obstructive pulmonary disease)   S/P MVR (mitral valve replacement)   Chronic anticoagulation   Osteoarthritis of left knee   Atypical atrial flutter   Closed intertrochanteric fracture of right femur, initial encounter   H/O mitral valve replacement with mechanical valve   Chronic HFrEF (heart failure with reduced ejection fraction)   Closed fracture of right hip  R intertochanteric fracture 2/2 likely mechanical fall S/p orthopedic surgery with R intramedullary nail placement for intertrochanteric fracture. Will be weight bearing as tolerated and will need PT/OT eval.Currently on PCA pump, oxygenating at goal and mentating well. Will monitor blood counts in the perioperative period. Can resume heparin ggt at 3AM. Will restart warfarin in the AM -appreciate orthopedic management -appreciate pharmacy management of warfarin, holding currently can restart in the AM -heparing ggt bridge for warfarin -heart healthy diet -PT/OT -PCA pump -senna, mirilax     Vitamin D deficiency Osteopenia Patient with prior history of osteopenia on DEXA and vitamin D deficiency to 5 this admission. Will replete vitamin D, recheck in the outpatient setting, repeat DEXA with potential bisphosphonates of showing osteoporosis. -vitamin D 50K units once weekly  COPD Tobacco use disorder -supplemental O2 prn, maintain O2 saturations >88% and wean off O2 as able -breo ellipta + incruse ellipta (in place of home trelegy)  -home albuterol nebs q6 prn -nicotine patch -continue counseling on smoking cessation   HTN CAD Chronic combined systolic and diastolic HF ECHO in [id number] with EF 35-40%, severe RV dysfunction, stable MVR, severe TVR. Follows [facility] HF team (Dr. Haroldine Laws) in the outpatient setting.  She states she is not taking spironolactone at home. Will resume her home maintenance torsemide dose. BP has been low during admission will hold losartan and farxiga for now. -hold losartan 12.5mg  daily -hold farxiga 10mg  daily -continue toprol-xl 25mg  daily -continue torsemide 40mg  daily -trend I/O's   S/p mechanical mitral valve replacement On chronic warfarin therapy Performed in [place] in 2001. Recent ECHO on 04/2022 with stable MVR with mean gradient 3. On warfarin for anticoagulation, follows Dr. Elie Confer. See above R trochanteric fracture for further assessment. -holding warfarin for now,restarting in AM -heparin ggt bridge -appreciate pharmacy monitoring and management -trend PT/INR, goal INR 2.5-3.5   Paroxysmal A-flutter -holding warfarin -holding amiodarone for now -continue toprol-xl 25mg  daily   CKD IIIA Baseline Cr appears to be around 1-1.2 with GFRs in the 76s. Creatinine 0.9  today and stable. -daily bmp -monitor UOP   HLD Lipid panel on 02/2022 with LDL 44.  -resumed home zocor 20mg  daily    Hypokalemia -daily bmp,replete as needed  Prior to Admission Living Arrangement: Anticipated Discharge Location: Barriers to  Discharge: Dispo: Anticipated discharge in approximately >2 days day(s).   Iona Coach, MD 09/23/2022, 2:56 PM Pager: Iona Coach, MD Internal Medicine Resident, PGY-1 [facility]  Pager: [phone] After 5pm on weekdays and 1pm on weekends: On Call pager [phone]

## 2022-09-24 DIAGNOSIS — Z7901 Long term (current) use of anticoagulants: Secondary | ICD-10-CM

## 2022-09-24 DIAGNOSIS — S72001A Fracture of unspecified part of neck of right femur, initial encounter for closed fracture: Secondary | ICD-10-CM | POA: Diagnosis not present

## 2022-09-24 DIAGNOSIS — Z952 Presence of prosthetic heart valve: Secondary | ICD-10-CM | POA: Diagnosis not present

## 2022-09-24 LAB — BASIC METABOLIC PANEL
Anion gap: 10 (ref 5–15)
BUN: 14 mg/dL (ref 8–23)
CO2: 24 mmol/L (ref 22–32)
Calcium: 9.1 mg/dL (ref 8.9–10.3)
Chloride: 100 mmol/L (ref 98–111)
Creatinine, Ser: 0.78 mg/dL (ref 0.44–1.00)
GFR, Estimated: >60 mL/min (ref >60)
Glucose, Bld: 131 mg/dL — ABNORMAL HIGH (ref 70–99)
Potassium: 3.9 mmol/L (ref 3.5–5.1)
Sodium: 134 mmol/L — ABNORMAL LOW (ref 135–145)

## 2022-09-24 LAB — CBC
HCT: 29.4 % — ABNORMAL LOW (ref 36.0–46.0)
Hemoglobin: 9.4 g/dL — ABNORMAL LOW (ref 12.0–15.0)
MCH: 33.5 pg (ref 26.0–34.0)
MCHC: 32 g/dL (ref 30.0–36.0)
MCV: 104.6 fL — ABNORMAL HIGH (ref 80.0–100.0)
Platelets: 227 10*3/uL (ref 150–400)
RBC: 2.81 MIL/uL — ABNORMAL LOW (ref 3.87–5.11)
RDW: 13.6 % (ref 11.5–15.5)
WBC: 19.3 10*3/uL — ABNORMAL HIGH (ref 4.0–10.5)
nRBC: 0 % (ref 0.0–0.2)

## 2022-09-24 LAB — GLUCOSE, CAPILLARY: Glucose-Capillary: 101 mg/dL — ABNORMAL HIGH (ref 70–99)

## 2022-09-24 LAB — HEMOGLOBIN AND HEMATOCRIT, BLOOD
HCT: 31.9 % — ABNORMAL LOW (ref 36.0–46.0)
Hemoglobin: 10 g/dL — ABNORMAL LOW (ref 12.0–15.0)

## 2022-09-24 LAB — PROTIME-INR
INR: 1.1 (ref 0.8–1.2)
Prothrombin Time: 14.3 seconds (ref 11.4–15.2)

## 2022-09-24 LAB — HEPARIN LEVEL (UNFRACTIONATED): Heparin Unfractionated: <0.1 IU/mL — ABNORMAL LOW (ref 0.30–0.70)

## 2022-09-24 MED ORDER — BISACODYL 10 MG RE SUPP
10.0000 mg | Freq: Once | RECTAL | Status: AC
  Administered 2022-09-24: 10 mg via RECTAL
  Filled 2022-09-24: qty 1

## 2022-09-24 MED ORDER — OXYCODONE HCL 5 MG PO TABS
10.0000 mg | ORAL_TABLET | ORAL | Status: DC | PRN
  Administered 2022-09-24 – 2022-09-29 (×27): 10 mg via ORAL
  Filled 2022-09-24 (×27): qty 2

## 2022-09-24 MED ORDER — ENOXAPARIN SODIUM 80 MG/0.8ML IJ SOSY
70.0000 mg | PREFILLED_SYRINGE | Freq: Two times a day (BID) | INTRAMUSCULAR | Status: DC
  Administered 2022-09-24 – 2022-09-29 (×10): 70 mg via SUBCUTANEOUS
  Filled 2022-09-24 (×14): qty 0.7

## 2022-09-24 MED ORDER — SIMETHICONE 80 MG PO CHEW
80.0000 mg | CHEWABLE_TABLET | Freq: Once | ORAL | Status: AC | PRN
  Administered 2022-09-24: 80 mg via ORAL
  Filled 2022-09-24: qty 1

## 2022-09-24 MED ORDER — WARFARIN SODIUM 7.5 MG PO TABS
7.5000 mg | ORAL_TABLET | Freq: Once | ORAL | Status: AC
  Administered 2022-09-24: 7.5 mg via ORAL
  Filled 2022-09-24: qty 1

## 2022-09-24 MED ORDER — HEPARIN (PORCINE) 25000 UT/250ML-% IV SOLN
1250.0000 [IU]/h | INTRAVENOUS | Status: DC
  Administered 2022-09-24: 1250 [IU]/h via INTRAVENOUS

## 2022-09-24 NOTE — Progress Notes (Signed)
ANTICOAGULATION CONSULT NOTE   Pharmacy Consult for warfarin/heparin>>Lovenox Indication: Hx PE/DVT, mechanical MVR, Afib  Allergies  Allergen Reactions   Lisinopril Swelling   Acetaminophen-Codeine Itching   Propoxyphene Itching    Darvocet   Tape Rash    Plastic   Tramadol Itching, Nausea And Vomiting and Rash    Patient Measurements: Height: 5\' 5"  (165.1 cm) Weight: 69.9 kg (154 lb) IBW/kg (Calculated) : 57 Heparin dosing weight 69.9 kg (TBW)   Vital Signs: Temp: 98.3 F (36.8 C) (04/04 0809) Temp Source: Oral (04/04 0809) BP: 101/78 (04/04 0809) Pulse Rate: 85 (04/04 0814)  Labs: Recent Labs    09/22/22 0842 09/22/22 1840 09/22/22 2030 09/23/22 0703 09/24/22 0711 09/24/22 1133 09/24/22 1639  HGB 11.6*  --   --  11.0*  --  9.4* 10.0*  HCT 35.5*  --   --  34.2*  --  29.4* 31.9*  PLT 256  --   --  236  --  227  --   APTT  --   --  39*  --   --   --   --   LABPROT 29.2* 18.6*  --  14.7 14.3  --   --   INR 2.8* 1.6*  --  1.2 1.1  --   --   HEPARINUNFRC  --   --   --   --   --  <0.10*  --   CREATININE 1.09*  --   --  0.90  --  0.78  --      Estimated Creatinine Clearance: 62.4 mL/min (by C-G formula based on SCr of 0.78 mg/dL).   Medical History: Past Medical History:  Diagnosis Date   Arthritis    Asthma    Breast cancer 2001   Left Breast Cancer   CHF (congestive heart failure)    COPD (chronic obstructive pulmonary disease)    Coronary artery disease    GERD (gastroesophageal reflux disease)    History of mitral valve replacement with mechanical valve    HLD (hyperlipidemia)    Hypertension    Pulmonary embolism 2021    Assessment: 43 YOF presenting with R hip fracture 2/2 fall on warfarin PTA with INR on admission 3.3. Pt has a history of PE/DVT, mechanical MVR, and Afib and was taking warfarin 5mg  po daily prior to admission. Last dose of warfarin was taken on 09/20/22 at 17:30. Plan to hold warfarin for surgery. Pharmacy consulted to initiate  heparin bridging once INR < 2.5  Pt is s/p ortho surgical repair for hip fx, IM nailing.   Okay per ORtho to resume coumadin post op yesterday and heparin at 0300 this morning. INR likely to be resistant due to vitamin K.  INR today is 1.1. subtherapeutic INR, due to  reversal with vitamin K.   The  7 hour heparin level is <0.1 on heparin 1050 units/hr,  subtherapeutic.  RN reports heparin drip infusing at correct rate, no issues noted. No bleeding reported.  Hgb stable this PM. Pt was having some issue with IV line with IV heparin. Ok to transition to Lovenox.   Goal of Therapy:  INR 2.5-3.5 Anti-Xa: 0.6-1 Monitor platelets by anticoagulation protocol: Yes   Plan:  Coumadin 7.5mg  PO x1 Change heparin to Lovenox 70mg  SQ BID  Daily INR and CBC  Onnie Boer, PharmD, BCIDP, AAHIVP, CPP Infectious Disease Pharmacist 09/24/2022 6:12 PM

## 2022-09-24 NOTE — Progress Notes (Signed)
Subjective: 1 Day Post-Op Procedure(s) (LRB): INTRAMEDULLARY (IM) NAIL INTERTROCHANTERIC (N/A) Patient reports pain as mild.    Objective: Vital signs in last 24 hours: Temp:  [97.9 F (36.6 C)-99.5 F (37.5 C)] 98.6 F (37 C) (04/04 0500) Pulse Rate:  [88-103] 88 (04/04 0500) Resp:  [14-26] 14 (04/04 0500) BP: (103-147)/(57-98) 103/79 (04/04 0500) SpO2:  [92 %-99 %] 99 % (04/04 0500) FiO2 (%):  [41 %-94 %] 41 % (04/03 2228) Weight:  [69.9 kg] 69.9 kg (04/03 1232)  Intake/Output from previous day: 04/03 0701 - 04/04 0700 In: 1027.8 [I.V.:927.8; IV Piggyback:100] Out: 1400 [Urine:1300; Blood:100] Intake/Output this shift: No intake/output data recorded.  Recent Labs    09/22/22 0842 09/23/22 0703  HGB 11.6* 11.0*   Recent Labs    09/22/22 0842 09/23/22 0703  WBC 13.2* 14.9*  RBC 3.47* 3.32*  HCT 35.5* 34.2*  PLT 256 236   Recent Labs    09/22/22 0842 09/23/22 0703  NA 136 137  K 3.7 3.3*  CL 101 98  CO2 25 25  BUN 39* 24*  CREATININE 1.09* 0.90  GLUCOSE 118* 131*  CALCIUM 8.7* 8.9   Recent Labs    09/22/22 1840 09/23/22 0703  INR 1.6* 1.2    Neurologically intact Neurovascular intact Sensation intact distally Intact pulses distally Dorsiflexion/Plantar flexion intact Incision: dressing C/D/I No cellulitis present Compartment soft   Assessment/Plan: 1 Day Post-Op Procedure(s) (LRB): INTRAMEDULLARY (IM) NAIL INTERTROCHANTERIC (N/A) Up with therapy WBAT RLE Ok from ortho standpoint to resume anticoagulant F/u with Dr. Erlinda Hong 2 weeks post-op for staple removal      Aundra Dubin 09/24/2022, 7:41 AM

## 2022-09-24 NOTE — Care Management Important Message (Signed)
Important Message  Patient Details  Name: Kim Anthony [name]: [id number] Date of Birth: Aug 18, 1950   Medicare Important Message Given:  Yes     Hannah Beat 09/24/2022, 11:51 AM

## 2022-09-24 NOTE — TOC Initial Note (Signed)
Transition of Care [facility]) - Initial/Assessment Note    Patient Details  Name: Kim Anthony [name]: [id number] Date of Birth: April 05, 1951  Transition of Care [facility]) CM/SW Contact:    Joanne Chars, LCSW Phone Number: 09/24/2022, 3:11 PM  Clinical Narrative:   CSW spoke with pt regarding DC recommendation for SNF.  Pt agreeable to SNF, medicare choice document provided.  Pt lives with daughter, no current services.  Permission given to speak with daughter Harrold.  Pt reports she is vaccinated for covid with one booster.  Referral sent out in hub for SNF.    CSW called to room, daughter [name] now here, discussed the above and she is in agreement.                Expected Discharge Plan: Skilled Nursing Facility Barriers to Discharge: SNF Pending bed offer   Patient Goals and CMS Choice   CMS Medicare.gov Compare Post Acute Care list provided to:: Patient Choice offered to / list presented to : Patient      Expected Discharge Plan and Services In-house Referral: Clinical Social Work   Post Acute Care Choice: [place] Living arrangements for the past 2 months: [place]                                      Prior Living Arrangements/Services Living arrangements for the past 2 months: Single Family Home Lives with:: Adult Children Patient language and need for interpreter reviewed:: Yes Do you feel safe going back to the place where you live?: Yes      Need for Family Participation in Patient Care: Yes (Comment) Care giver support system in place?: Yes (comment) Current home services: DME (showerchair, nebulizer, INR machine) Criminal Activity/Legal Involvement Pertinent to Current Situation/Hospitalization: No - Comment as needed  Activities of Daily Living Home Assistive Devices/Equipment: Cane (specify quad or straight) ADL Screening (condition at time of admission) Patient's cognitive ability adequate to safely complete daily activities?:  Yes Is the patient deaf or have difficulty hearing?: No Does the patient have difficulty seeing, even when wearing glasses/contacts?: No Does the patient have difficulty concentrating, remembering, or making decisions?: No Patient able to express need for assistance with ADLs?: Yes Does the patient have difficulty dressing or bathing?: No Independently performs ADLs?: No Communication: Independent Dressing (OT): Needs assistance Is this a change from baseline?: Change from baseline, expected to last >3 days Grooming: Independent Feeding: Independent Bathing: Needs assistance Is this a change from baseline?: Change from baseline, expected to last >3 days Toileting: Needs assistance Is this a change from baseline?: Change from baseline, expected to last >3days In/Out Bed: Needs assistance Is this a change from baseline?: Change from baseline, expected to last >3 days Walks in Home: Needs assistance Is this a change from baseline?: Change from baseline, expected to last >3 days Does the patient have difficulty walking or climbing stairs?: Yes Weakness of Legs: Both Weakness of Arms/Hands: None  Permission Sought/Granted Permission sought to share information with : Family Supports Permission granted to share information with : Yes, Verbal Permission Granted  Share Information with NAME: Kriston Kender  Daughter  [phone]  Permission granted to share info w AGENCY: SNF        Emotional Assessment Appearance:: Appears stated age Attitude/Demeanor/Rapport: Engaged Affect (typically observed): Accepting Orientation: : Oriented to Self, Oriented to Place, Oriented to  Time, Oriented to Situation  Alcohol / Substance Use: Tobacco Use Psych Involvement: No (comment)  Admission diagnosis:  Intertrochanteric fracture [S72.143A] Fall, initial encounter [W19.XXXA] Closed fracture of right hip, initial encounter [S72.001A] Patient Active Problem List   Diagnosis Date Noted   Closed  fracture of right hip 09/23/2022   Closed intertrochanteric fracture of right femur, initial encounter 09/22/2022   H/O mitral valve replacement with mechanical valve 09/22/2022   Chronic HFrEF (heart failure with reduced ejection fraction) 09/22/2022   COVID-19 virus infection 08/19/2022   Endometrial thickening on ultrasound 07/07/2022   Gastroesophageal reflux disease without esophagitis 06/29/2022   Chronic pain of both knees 06/29/2022   Vaginal bleeding 04/01/2022   A-fib 03/13/2022   Facial edema 03/07/2022   Atypical atrial flutter    Combined systolic and diastolic heart failure 99991111   CHF (congestive heart failure) 02/19/2022   Combined systolic and diastolic congestive heart failure 02/18/2022   Acute pulmonary edema    Edema of both lower extremities 01/13/2022   Hypotension 01/13/2022   Subacute cough 11/28/2021   COPD exacerbation 11/05/2021   Conjunctival hemorrhage of left eye 06/25/2021   Osteoarthritis of left knee 04/24/2021   Seronegative inflammatory arthritis 02/17/2021   High risk medication use 02/17/2021   Elevated transaminase level 01/27/2021   Acute idiopathic gout of multiple sites 09/30/2020   Chronic pain syndrome 01/17/2020   Monoarthritis of wrist 10/18/2019   Ganglion cyst 09/21/2019   Hip pain 08/23/2019   Thoracic ascending aortic aneurysm 07/05/2019   Anemia 02/16/2019   Breast cancer    Acquired absence of left breast 12/20/2018   COPD (chronic obstructive pulmonary disease) 11/25/2017   S/P MVR (mitral valve replacement)    Chronic anticoagulation    Tobacco use disorder 02/25/2017   Lymphadenopathy, mediastinal 02/25/2017   Essential hypertension 02/01/2016   Allergic rhinitis 02/01/2016   Healthcare maintenance 01/30/2016   Chronic low back pain 07/22/2015   Pulmonary embolism 07/11/2015   Chronic combined systolic and diastolic heart failure [id number]   Headache, common migraine 07/11/2015   HLD (hyperlipidemia) 07/11/2015    PCP:  Iona Coach, MD Pharmacy:   [facility] [place] [address] Phone: [phone] Fax: [phone]     Social Determinants of Health (SDOH) Social History: SDOH Screenings   Food Insecurity: No Food Insecurity (09/21/2022)  Housing: Low Risk  (09/21/2022)  Transportation Needs: No Transportation Needs (09/21/2022)  Utilities: Not At Risk (09/21/2022)  Recent Concern: Utilities - At Risk (07/03/2022)  Depression (PHQ2-9): Low Risk  (07/02/2022)  Financial Resource Strain: Medium Risk (02/27/2021)  Social Connections: Socially Isolated (11/28/2019)  Tobacco Use: High Risk (09/23/2022)   SDOH Interventions:     Readmission Risk Interventions    02/19/2022    3:36 PM  Readmission Risk Prevention Plan  Transportation Screening Complete  PCP or Specialist Appt within 3-5 Days Complete  HRI or [place] Complete  Social Work Consult for [place] Planning/Counseling Complete  Palliative Care Screening Not Applicable  Medication Review Press photographer) Complete

## 2022-09-24 NOTE — Progress Notes (Signed)
Patient stated that tylenol is safe to administer.  Tylenol-codeine listed as allergy med.  Patient alert and oriented.  Family at bedside.

## 2022-09-24 NOTE — NC FL2 (Signed)
[place] LEVEL OF CARE FORM     IDENTIFICATION  Patient Name: CATELYN KASZUBA Birthdate: 24-Mar-1951 Sex: female Admission Date (Current Location): 09/21/2022  [place] and [place] Number:  Kathleen Argue [id number] [place] and Address:  The [place]. [facility], [place] [address], [place], [place] [address]      Provider Number: [id number]  Attending Physician Name and Address:  Lottie Mussel, MD  Relative Name and Phone Number:  Taiasha, Hayley Daughter [phone]    Current Level of Care: Hospital Recommended Level of Care: [place] Prior Approval Number:    Date Approved/Denied:   PASRR Number: [id number]  Discharge Plan: SNF    Current Diagnoses: Patient Active Problem List   Diagnosis Date Noted   Closed fracture of right hip 09/23/2022   Closed intertrochanteric fracture of right femur, initial encounter 09/22/2022   H/O mitral valve replacement with mechanical valve 09/22/2022   Chronic HFrEF (heart failure with reduced ejection fraction) 09/22/2022   COVID-19 virus infection 08/19/2022   Endometrial thickening on ultrasound 07/07/2022   Gastroesophageal reflux disease without esophagitis 06/29/2022   Chronic pain of both knees 06/29/2022   Vaginal bleeding 04/01/2022   A-fib 03/13/2022   Facial edema 03/07/2022   Atypical atrial flutter    Combined systolic and diastolic heart failure [id number]   CHF (congestive heart failure) 02/19/2022   Combined systolic and diastolic congestive heart failure 02/18/2022   Acute pulmonary edema    Edema of both lower extremities 01/13/2022   Hypotension 01/13/2022   Subacute cough 11/28/2021   COPD exacerbation 11/05/2021   Conjunctival hemorrhage of left eye 06/25/2021   Osteoarthritis of left knee 04/24/2021   Seronegative inflammatory arthritis 02/17/2021   High risk medication use 02/17/2021   Elevated transaminase level 01/27/2021   Acute idiopathic gout of multiple sites  09/30/2020   Chronic pain syndrome 01/17/2020   Monoarthritis of wrist 10/18/2019   Ganglion cyst 09/21/2019   Hip pain 08/23/2019   Thoracic ascending aortic aneurysm 07/05/2019   Anemia 02/16/2019   Breast cancer    Acquired absence of left breast 12/20/2018   COPD (chronic obstructive pulmonary disease) 11/25/2017   S/P MVR (mitral valve replacement)    Chronic anticoagulation    Tobacco use disorder 02/25/2017   Lymphadenopathy, mediastinal 02/25/2017   Essential hypertension 02/01/2016   Allergic rhinitis 02/01/2016   Healthcare maintenance 01/30/2016   Chronic low back pain 07/22/2015   Pulmonary embolism 07/11/2015   Chronic combined systolic and diastolic heart failure 123456   Headache, common migraine 07/11/2015   HLD (hyperlipidemia) 07/11/2015    Orientation RESPIRATION BLADDER Height & Weight     Self, Time, Situation, Place  O2 Continent Weight: 154 lb (69.9 kg) Height:  5\' 5"  (165.1 cm)  BEHAVIORAL SYMPTOMS/MOOD NEUROLOGICAL BOWEL NUTRITION STATUS      Continent Diet  AMBULATORY STATUS COMMUNICATION OF NEEDS Skin   Limited Assist Verbally Surgical wounds                       Personal Care Assistance Level of Assistance  Bathing, Feeding, Dressing Bathing Assistance: Limited assistance Feeding assistance: Limited assistance Dressing Assistance: Limited assistance     Functional Limitations Info  Sight, Hearing, Speech Sight Info: Adequate Hearing Info: Adequate Speech Info: Adequate    SPECIAL CARE FACTORS FREQUENCY  PT (By licensed PT), OT (By licensed OT)     PT Frequency: 5x week OT Frequency: 5x weej  Contractures Contractures Info: Not present    Additional Factors Info  Code Status, Allergies Code Status Info: full Allergies Info: Lisinopril, Acetaminophen-codeine, Propoxyphene, Tape, Tramadol           Current Medications (09/24/2022):  This is the current hospital active medication list Current  Facility-Administered Medications  Medication Dose Route Frequency Provider Last Rate Last Admin   0.9 %  sodium chloride infusion   Intravenous Continuous Leandrew Koyanagi, MD 10 mL/hr at 09/24/22 0728 Rate Change at 09/24/22 0728   acetaminophen (TYLENOL) tablet 1,000 mg  1,000 mg Oral Q6H Leandrew Koyanagi, MD   1,000 mg at 09/24/22 1129   acetaminophen (TYLENOL) tablet 325-650 mg  325-650 mg Oral Q6H PRN Leandrew Koyanagi, MD       albuterol (PROVENTIL) (2.5 MG/3ML) 0.083% nebulizer solution 2.5 mg  2.5 mg Inhalation Q6H PRN Leandrew Koyanagi, MD       alum & mag hydroxide-simeth (MAALOX/MYLANTA) 200-200-20 MG/5ML suspension 30 mL  30 mL Oral Q4H PRN Leandrew Koyanagi, MD   30 mL at 09/24/22 0914   DULoxetine (CYMBALTA) DR capsule 20 mg  20 mg Oral QHS Leandrew Koyanagi, MD   20 mg at 09/23/22 2107   fluticasone furoate-vilanterol (BREO ELLIPTA) 100-25 MCG/ACT 1 puff  1 puff Inhalation Daily Leandrew Koyanagi, MD   1 puff at 09/24/22 0814   And   umeclidinium bromide (INCRUSE ELLIPTA) 62.5 MCG/ACT 1 puff  1 puff Inhalation Daily Leandrew Koyanagi, MD   1 puff at 09/24/22 0814   gabapentin (NEURONTIN) capsule 600 mg  600 mg Oral TID Leandrew Koyanagi, MD   600 mg at 09/24/22 0844   heparin ADULT infusion 100 units/mL (25000 units/238mL)  1,250 Units/hr Intravenous Continuous Wendee Beavers, RPH       menthol-cetylpyridinium (CEPACOL) lozenge 3 mg  1 lozenge Oral PRN Leandrew Koyanagi, MD       Or   phenol (CHLORASEPTIC) mouth spray 1 spray  1 spray Mouth/Throat PRN Leandrew Koyanagi, MD       methocarbamol (ROBAXIN) tablet 500 mg  500 mg Oral Q6H PRN Leandrew Koyanagi, MD   500 mg at 09/23/22 2107   Or   methocarbamol (ROBAXIN) 500 mg in dextrose 5 % 50 mL IVPB  500 mg Intravenous Q6H PRN Leandrew Koyanagi, MD       metoprolol succinate (TOPROL-XL) 24 hr tablet 25 mg  25 mg Oral Daily Leandrew Koyanagi, MD   25 mg at 09/23/22 1032   nicotine (NICODERM CQ - dosed in mg/24 hours) patch 14 mg  14 mg Transdermal Daily Leandrew Koyanagi, MD   14 mg at  09/24/22 0855   ondansetron (ZOFRAN) tablet 4 mg  4 mg Oral Q6H PRN Leandrew Koyanagi, MD       Or   ondansetron [facility]) injection 4 mg  4 mg Intravenous Q6H PRN Leandrew Koyanagi, MD       oxyCODONE (Oxy IR/ROXICODONE) immediate release tablet 10 mg  10 mg Oral Q4H PRN Iona Coach, MD   10 mg at 09/24/22 1107   pantoprazole (PROTONIX) EC tablet 40 mg  40 mg Oral Daily Leandrew Koyanagi, MD   40 mg at 09/24/22 0845   polyethylene glycol (MIRALAX / GLYCOLAX) packet 17 g  17 g Oral BID Leandrew Koyanagi, MD   17 g at 09/24/22 0850   polyethylene glycol (MIRALAX / GLYCOLAX) packet 17 g  17 g Oral Daily  PRN Leandrew Koyanagi, MD   17 g at 09/23/22 1740   senna-docusate (Senokot-S) tablet 2 tablet  2 tablet Oral QHS Leandrew Koyanagi, MD   2 tablet at 09/23/22 2107   simethicone (MYLICON) chewable tablet 80 mg  80 mg Oral Once PRN Sanjuan Dame, MD       simvastatin (ZOCOR) tablet 20 mg  20 mg Oral Daily Leandrew Koyanagi, MD   20 mg at 09/24/22 0844   torsemide (DEMADEX) tablet 40 mg  40 mg Oral Daily Leandrew Koyanagi, MD   40 mg at 09/24/22 0844   Vitamin D (Ergocalciferol) (DRISDOL) 1.25 MG (50000 UNIT) capsule 50,000 Units  50,000 Units Oral Q7 days Leandrew Koyanagi, MD   50,000 Units at 09/21/22 1319   warfarin (COUMADIN) tablet 7.5 mg  7.5 mg Oral ONCE-1600 Wendee Beavers, [facility]       Warfarin - Pharmacist Dosing Inpatient   Does not apply q1600 Dinah Beers, RPH-CPP         Discharge Medications: Please see discharge summary for a list of discharge medications.  Relevant Imaging Results:  Relevant Lab Results:   Additional Information SSN: [id number].  Pt is vaccinated for covid with booster.  Joanne Chars, LCSW

## 2022-09-24 NOTE — Progress Notes (Signed)
[facility] for warfarin/heparin Indication: Hx PE/DVT, mechanical MVR, Afib  Allergies  Allergen Reactions   Lisinopril Swelling   Acetaminophen-Codeine Itching   Propoxyphene Itching    Darvocet   Tape Rash    Plastic   Tramadol Itching, Nausea And Vomiting and Rash    Patient Measurements: Height: 5\' 5"  (165.1 cm) Weight: 69.9 kg (154 lb) IBW/kg (Calculated) : 57 Heparin dosing weight 69.9 kg (TBW)   Vital Signs: Temp: 98.3 F (36.8 C) (04/04 0809) Temp Source: Oral (04/04 0809) BP: 101/78 (04/04 0809) Pulse Rate: 85 (04/04 0814)  Labs: Recent Labs    09/22/22 0842 09/22/22 1840 09/22/22 2030 09/23/22 0703 09/24/22 0711 09/24/22 1133  HGB 11.6*  --   --  11.0*  --  9.4*  HCT 35.5*  --   --  34.2*  --  29.4*  PLT 256  --   --  236  --  227  APTT  --   --  39*  --   --   --   LABPROT 29.2* 18.6*  --  14.7 14.3  --   INR 2.8* 1.6*  --  1.2 1.1  --   HEPARINUNFRC  --   --   --   --   --  <0.10*  CREATININE 1.09*  --   --  0.90  --  0.78     Estimated Creatinine Clearance: 62.4 mL/min (by C-G formula based on SCr of 0.78 mg/dL).   Medical History: Past Medical History:  Diagnosis Date   Arthritis    Asthma    Breast cancer 2001   Left Breast Cancer   CHF (congestive heart failure)    COPD (chronic obstructive pulmonary disease)    Coronary artery disease    GERD (gastroesophageal reflux disease)    History of mitral valve replacement with mechanical valve    HLD (hyperlipidemia)    Hypertension    Pulmonary embolism 2021    Assessment: 43 YOF presenting with R hip fracture 2/2 fall on warfarin PTA with INR on admission 3.3. Pt has a history of PE/DVT, mechanical MVR, and Afib and was taking warfarin 5mg  po daily prior to admission. Last dose of warfarin was taken on 09/20/22 at 17:30. Plan to hold warfarin for surgery. Pharmacy consulted to initiate heparin bridging once INR < 2.5  Pt is s/p ortho surgical  repair for hip fx, IM nailing.   Okay per ORtho to resume coumadin post op yesterday and heparin at 0300 this morning. INR likely to be resistant due to vitamin K.  INR today is 1.1. subtherapeutic INR, due to  reversal with vitamin K.   The  7 hour heparin level is <0.1 on heparin 1050 units/hr,  subtherapeutic.  RN reports heparin drip infusing at correct rate, no issues noted. No bleeding reported.   Goal of Therapy:  INR 2.5-3.5 Heparin level 0.3-0.7 units/ml Monitor platelets by anticoagulation protocol: Yes   Plan:  Coumadin 7.5mg  PO x1 Increase heparin drip to 1250 units/hr. Check 6 hr  Daily INR/HL and CBC  Thank you for allowing pharmacy to be part of this patients care team. Nicole Cella, RPh Clinical Pharmacist  09/24/2022 1:29 PM

## 2022-09-24 NOTE — Evaluation (Signed)
Occupational Therapy Evaluation Patient Details Name: Kim Anthony [name]: [id number] DOB: 12/07/1950 Today's Date: 09/24/2022   History of Present Illness 72 y.o. female presenting 09/21/2022 with R hip pain and swelling after mechanical fall on her porch. Pt found to have right hip IT fracture. Now s/p intertrochanteric intramedullary nailing 4/3. PMH significant for COPD, HTN, CHF, mechanical MVR on warfarin, pAF, PE/DVT, breast cancer.   Clinical Impression   Pt presenting with problem above with deficits listed below. PTA, pt mod I for ADL. Upon eval, pt with decreased tolerance of weightbearing on RLE, decreased ROM, strength, balance, and safety. Pt requiring significant assist for transfers and ADL. Pt to benefit from skilled OT services acutely and post-acutely. Patient will benefit from continued inpatient follow up therapy, <3 hours/day.      Recommendations for follow up therapy are one component of a multi-disciplinary discharge planning process, led by the attending physician.  Recommendations may be updated based on patient status, additional functional criteria and insurance authorization.   Assistance Recommended at Discharge Frequent or constant Supervision/Assistance  Patient can return home with the following Two people to help with walking and/or transfers;A lot of help with bathing/dressing/bathroom;Assistance with cooking/housework;Assist for transportation;Help with stairs or ramp for entrance    Functional Status Assessment  Patient has had a recent decline in their functional status and demonstrates the ability to make significant improvements in function in a reasonable and predictable amount of time.  Equipment Recommendations  Other (comment) (defer)    Recommendations for Other Services       Precautions / Restrictions Precautions Precautions: Fall Precaution Comments: premedicate Restrictions Weight Bearing Restrictions: Yes RLE Weight Bearing: Weight bearing  as tolerated      Mobility Bed Mobility Overal bed mobility: Needs Assistance Bed Mobility: Supine to Sit, Sit to Supine     Supine to sit: Min assist Sit to supine: Mod assist   General bed mobility comments: Min A for bring RLE off EOB and for scooting toward EOB. Mod A for BLE back into bed    Transfers Overall transfer level: Needs assistance Equipment used: 1 person hand held assist, Rolling walker (2 wheels) Transfers: Sit to/from Stand, Bed to chair/wheelchair/BSC Sit to Stand: Min assist     Step pivot transfers: Min assist, Mod assist     General transfer comment: Min A for power up. Mod A for steps toward L to [facility]; min A for steps toward R with pivotig on L foot rather than picking it up. Cues throughout      Balance Overall balance assessment: Needs assistance, History of Falls Sitting-balance support: Feet supported Sitting balance-Leahy Scale: Fair     Standing balance support: Bilateral upper extremity supported, During functional activity Standing balance-Leahy Scale: Poor Standing balance comment: assistance for control of standing balance on walker with cues for each step of the way                           ADL either performed or assessed with clinical judgement   ADL Overall ADL's : Needs assistance/impaired Eating/Feeding: Modified independent;Bed level   Grooming: Min guard;Sitting Grooming Details (indicate cue type and reason): Poor sitting tolerance due to pain Upper Body Bathing: Min guard;Sitting   Lower Body Bathing: Maximal assistance;Sit to/from stand   Upper Body Dressing : Min guard;Sitting   Lower Body Dressing: Maximal assistance;Sit to/from stand   Toilet Transfer: Moderate assistance;Minimal assistance;Stand-pivot;Rolling walker (2 wheels);BSC/3in1 Toilet Transfer Details (indicate cue  type and reason): Mod A upt o BSC on L; min A back to bed on R. Difficulty progressing LLE Toileting- Clothing Manipulation and  Hygiene: Total assistance;Sit to/from stand       Functional mobility during ADLs: Minimal assistance;Moderate assistance;Rolling walker (2 wheels) General ADL Comments: Limited by poor tolerance of weightbearing on RLE     Vision Baseline Vision/History: 0 No visual deficits Ability to See in Adequate Light: 0 Adequate Patient Visual Report: No change from baseline       Perception     Praxis      Pertinent Vitals/Pain Pain Assessment Pain Assessment: Faces Faces Pain Scale: Hurts even more Pain Location: R hip with mobility Pain Descriptors / Indicators: Guarding, Grimacing, Aching, Operative site guarding Pain Intervention(s): Limited activity within patient's tolerance, Monitored during session     Hand Dominance Right   Extremity/Trunk Assessment Upper Extremity Assessment Upper Extremity Assessment: Generalized weakness   Lower Extremity Assessment Lower Extremity Assessment: Defer to PT evaluation   Cervical / Trunk Assessment Cervical / Trunk Assessment: Kyphotic   Communication Communication Communication: No difficulties   Cognition Arousal/Alertness: Awake/alert Behavior During Therapy: Anxious, WFL for tasks assessed/performed Overall Cognitive Status: Impaired/Different from baseline Area of Impairment: Safety/judgement, Problem solving                         Safety/Judgement: Decreased awareness of safety   Problem Solving: Requires verbal cues General Comments: Pt requires VC to follow commands due to being anxious with movement     General Comments       Exercises     Shoulder Instructions      Home Living Family/patient expects to be discharged to:: Skilled nursing facility Living Arrangements: Children                               Additional Comments: home with 8 STE, has R rail and all level within.  Was home alone previously, now to stay with daughter.  Has SPC, rollator, WC, RW, shower chair      Prior  Functioning/Environment Prior Level of Function : Independent/Modified Independent             Mobility Comments: used rollator at baseline ADLs Comments: rollator; able to perform ADL        OT Problem List:        OT Treatment/Interventions:      OT Goals(Current goals can be found in the care plan section) Acute Rehab OT Goals Patient Stated Goal: get better OT Goal Formulation: With patient Time For Goal Achievement: 10/08/22 Potential to Achieve Goals: Good  OT Frequency:      Co-evaluation              AM-PAC OT "6 Clicks" Daily Activity     Outcome Measure                 End of Session    Activity Tolerance:   Patient left:                     Time: 1600-1636 OT Time Calculation (min): 36 min Charges:  OT General Charges $OT Visit: 1 Visit OT Evaluation $OT Eval Low Complexity: 1 Low OT Treatments $Self Care/Home Management : 8-22 mins  Elder Cyphers, OTR/L [facility] Acute Rehabilitation Office: [phone]   Magnus Ivan 09/24/2022, 5:42 PM

## 2022-09-24 NOTE — Progress Notes (Signed)
Subjective: Patient evaluated at the bedside this AM with her daughter. Still having pain but getting better. No bowel movement yet. No n/v. Eating well. No lightheadedness or dizziness. Not yet up and out of bed.     Objective:  Vital signs in last 24 hours: Vitals:   09/24/22 0050 09/24/22 0500 09/24/22 0809 09/24/22 0814  BP:  103/79 101/78   Pulse:  88 86 85  Resp: 14 14 14 14   Temp:  98.6 F (37 C) 98.3 F (36.8 C)   TempSrc:  Oral Oral   SpO2:  99% 100% 93%  Weight:      Height:       Gen: chronically ill appearing, in pain, laying in bed cooperative CV:RRR, no m/r/g, audible mechanical mitral valve, 2+ bilateral radial pulses Pulm: normal wob on RA, LCTAB MSK: No fullness,fluctuance or drainage around R hip surgical site, post op wound dressings in place Extr: warm, dry, no LE edema  Assessment/Plan:  Active Problems:   Chronic combined systolic and diastolic heart failure   HLD (hyperlipidemia)   Essential hypertension   Tobacco use disorder   COPD (chronic obstructive pulmonary disease)   S/P MVR (mitral valve replacement)   Chronic anticoagulation   Osteoarthritis of left knee   Atypical atrial flutter   Closed intertrochanteric fracture of right femur, initial encounter   H/O mitral valve replacement with mechanical valve   Chronic HFrEF (heart failure with reduced ejection fraction)   Closed fracture of right hip  R intertochanteric fracture 2/2 likely mechanical fall S/p orthopedic surgery with R intramedullary nail placement for intertrochanteric fracture. Will be weight bearing as tolerated. PT eval suggesting patient does need SNF placement for rehab, for which patient wants to do. Transitioned from PCA IV dilauded to oxy 10mg  q4 hours today, double her home does. On exam no fluctuance or fullness of the R hip to suggest bleeding. CBC with hgb from 11 to 9.4. Do not think this drop is explained by 75cc blood loss in surgery. Will continue heparin ggt,  can transition to lovenox in AM if cbc stable.Remains afebrile and HDS. -appreciate orthopedic management -F/u with Dr. Erlinda Hong 2 weeks post-op for staple remova  -heparing ggt bridge for warfarin -warfarin per pharmacy -heart healthy diet -PT/OT -oxy 10mg  q4hrs prn -senna, mirilax  S/p mechanical mitral valve replacement On chronic warfarin therapy Performed in [place] in 2001. Recent ECHO on 04/2022 with stable MVR with mean gradient 3. On warfarin for anticoagulation, follows Dr. Elie Confer. See above R trochanteric fracture for further assessment. -warfarin per pharmacy dosing -heparin ggt bridge -appreciate pharmacy monitoring and management -trend PT/INR, goal INR 2.5-3.5  HTN CAD Chronic combined systolic and diastolic HF ECHO in [id number] with EF 35-40%, severe RV dysfunction, stable MVR, severe TVR. Follows [facility] HF team (Dr. Haroldine Laws) in the outpatient setting.  She states she is not taking spironolactone at home. Will resume her home maintenance torsemide dose. BP has been low during admission will hold losartan and farxiga for now. -hold losartan 12.5mg  daily -hold farxiga 10mg  daily -continue toprol-xl 25mg  daily -continue torsemide 40mg  daily -trend I/O's   Vitamin D deficiency Osteopenia Patient with prior history of osteopenia on DEXA and vitamin D deficiency to 5 this admission. Will replete vitamin D, recheck in the outpatient setting, repeat DEXA with potential bisphosphonates of showing osteoporosis. -vitamin D 50K units once weekly  COPD Tobacco use disorder -supplemental O2 prn, maintain O2 saturations >88% and wean off O2 as able -breo ellipta +  incruse ellipta (in place of home trelegy)  -home albuterol nebs q6 prn -nicotine patch -continue counseling on smoking cessation     Paroxysmal A-flutter -holding warfarin -holding amiodarone for now -continue toprol-xl 25mg  daily   CKD IIIA Baseline Cr appears to be around 1-1.2 with GFRs in the 64s. Creatinine  0.9 today and stable. -daily bmp -monitor UOP   HLD Lipid panel on 02/2022 with LDL 44.  -resumed home zocor 20mg  daily    Hypokalemia -daily bmp,replete as needed  Prior to Admission Living Arrangement: Anticipated Discharge Location: Barriers to Discharge: Dispo: Anticipated discharge in approximately >2 days day(s).   Iona Coach, MD 09/24/2022, 2:39 PM Pager: Iona Coach, MD Internal Medicine Resident, PGY-1 [facility]  Pager: [phone] After 5pm on weekdays and 1pm on weekends: On Call pager [phone]

## 2022-09-24 NOTE — Progress Notes (Signed)
Physical Therapy Evaluation Patient Details Name: Kim Anthony [name]: [id number] DOB: July 08, 1950 Today's Date: 09/24/2022  History of Present Illness  72 y.o. female presenting 09/21/2022 with R hip pain and swelling after mechanical fall on her porch. Pt found to have right hip IT fracture. Now s/p intertrochanteric intramedullary nailing 4/3. PMH significant for COPD, HTN, CHF, mechanical MVR on warfarin, pAF, PE/DVT, breast cancer.  Clinical Impression  Pt was seen for mobility on RW to get from bed to [facility] and then chair with help to balance and sequence her walker.  Pt is attended by her family, and were able to help discuss the geography of home and concerns for her mobility to return there.  Pt is expected to go to rehab placement with SNF care, to work on further progress with gait and balance, and to keep her medicated appropriately for comfort to do inpt care with PT.  Focus on her control of standing balance, for quality of gait and progression of strength and flexibility on RLE.     Recommendations for follow up therapy are one component of a multi-disciplinary discharge planning process, led by the attending physician.  Recommendations may be updated based on patient status, additional functional criteria and insurance authorization.  Follow Up Recommendations Can patient physically be transported by private vehicle: No     Assistance Recommended at Discharge Frequent or constant Supervision/Assistance  Patient can return home with the following  A lot of help with walking and/or transfers;A lot of help with bathing/dressing/bathroom;Assistance with cooking/housework;Direct supervision/assist for medications management;Direct supervision/assist for financial management;Assist for transportation;Help with stairs or ramp for entrance    Equipment Recommendations None recommended by PT  Recommendations for Other Services       Functional Status Assessment Patient has had a recent decline  in their functional status and demonstrates the ability to make significant improvements in function in a reasonable and predictable amount of time.     Precautions / Restrictions Precautions Precautions: Fall Precaution Comments: premedicate Restrictions Weight Bearing Restrictions: Yes RLE Weight Bearing: Weight bearing as tolerated      Mobility  Bed Mobility Overal bed mobility: Needs Assistance Bed Mobility: Supine to Sit     Supine to sit: Min assist, Mod assist     General bed mobility comments: mod for lifting trunk and then min to steady on side of bed, min for RLE    Transfers Overall transfer level: Needs assistance Equipment used: 1 person hand held assist, Rolling walker (2 wheels) Transfers: Sit to/from Stand Sit to Stand: Min assist, Mod assist           General transfer comment: mod for initial power up then min to steady    Ambulation/Gait Ambulation/Gait assistance: Min assist, +2 physical assistance, +2 safety/equipment Gait Distance (Feet): 4 Feet Assistive device: Rolling walker (2 wheels), 2 person hand held assist Gait Pattern/deviations: Step-to pattern, Decreased stride length, Decreased weight shift to right, Wide base of support Gait velocity: reduced Gait velocity interpretation: <1.8 ft/sec, indicate of risk for recurrent falls Pre-gait activities: standing posture and balance General Gait Details: cued posture, sequence of steps and updating of RW placement with each step  Stairs            Wheelchair Mobility    Modified Rankin (Stroke Patients Only)       Balance Overall balance assessment: Needs assistance, History of Falls Sitting-balance support: Feet supported Sitting balance-Leahy Scale: Fair     Standing balance support: Bilateral upper extremity supported,  During functional activity Standing balance-Leahy Scale: Poor Standing balance comment: assistance for control of standing balance on walker with cues for  each step of the way                             Pertinent Vitals/Pain Pain Assessment Pain Assessment: 0-10 Pain Score: 8  Pain Location: R hip with mobility Pain Descriptors / Indicators: Guarding, Grimacing, Aching, Operative site guarding Pain Intervention(s): Limited activity within patient's tolerance, Monitored during session, Premedicated before session, Repositioned, Other (comment) (Nursing has asked MD for more meds)    Home Living Family/patient expects to be discharged to:: Skilled nursing facility Living Arrangements: Children                 Additional Comments: home with 8 STE, has R rail and all level within.  Was home alone previously, now to stay with daughter.  Has SPC, rollator, WC, RW, shower chair    Prior Function Prior Level of Function : Independent/Modified Independent             Mobility Comments: used rollator at baseline       Hand Dominance   Dominant Hand: Right    Extremity/Trunk Assessment   Upper Extremity Assessment Upper Extremity Assessment: Defer to OT evaluation    Lower Extremity Assessment Lower Extremity Assessment: Generalized weakness;RLE deficits/detail RLE Deficits / Details: new IM nailing with pain and mild edema, struggling with more than PWB RLE: Unable to fully assess due to pain RLE Coordination: decreased gross motor    Cervical / Trunk Assessment Cervical / Trunk Assessment: Kyphotic  Communication   Communication: No difficulties  Cognition Arousal/Alertness: Awake/alert Behavior During Therapy: Anxious, WFL for tasks assessed/performed Overall Cognitive Status: Within Functional Limits for tasks assessed                                 General Comments: Pt is contributing to history and family available for information        General Comments General comments (skin integrity, edema, etc.): Pt is up to side of bed with mod assist down to min with placement of feet on  floor, then mod to power up and mod to get to the [facility] and then recliner.  Pt is in a lot of pain but did not verbalize until the end    Exercises     Assessment/Plan    PT Assessment Patient needs continued PT services  PT Problem List Decreased strength;Decreased range of motion;Decreased activity tolerance;Decreased balance;Decreased mobility;Decreased coordination;Decreased knowledge of use of DME;Decreased skin integrity;Pain       PT Treatment Interventions DME instruction;Gait training;Stair training;Functional mobility training;Therapeutic activities;Therapeutic exercise;Balance training;Neuromuscular re-education;Patient/family education    PT Goals (Current goals can be found in the Care Plan section)  Acute Rehab PT Goals Patient Stated Goal: to get home and walk PT Goal Formulation: With patient/family Time For Goal Achievement: 10/08/22 Potential to Achieve Goals: Good    Frequency Min 3X/week     Co-evaluation               AM-PAC PT "6 Clicks" Mobility  Outcome Measure Help needed turning from your back to your side while in a flat bed without using bedrails?: A Lot Help needed moving from lying on your back to sitting on the side of a flat bed without using bedrails?: A Lot Help needed moving  to and from a bed to a chair (including a wheelchair)?: A Lot Help needed standing up from a chair using your arms (e.g., wheelchair or bedside chair)?: A Lot Help needed to walk in hospital room?: Total Help needed climbing 3-5 steps with a railing? : Total 6 Click Score: 10    End of Session Equipment Utilized During Treatment: Gait belt Activity Tolerance: Patient limited by fatigue;Patient limited by pain Patient left: in chair;with call bell/phone within reach;with chair alarm set;with family/visitor present;with nursing/sitter in room Nurse Communication: Mobility status;Other (comment) (transfer sequence) PT Visit Diagnosis: Unsteadiness on feet  (R26.81);Muscle weakness (generalized) (M62.81);Pain;Difficulty in walking, not elsewhere classified (R26.2);History of falling (Z91.81) Pain - Right/Left: Right Pain - part of body: Hip    Time: [id number] PT Time Calculation (min) (ACUTE ONLY): 34 min   Charges:   PT Evaluation $PT Eval Moderate Complexity: 1 Mod PT Treatments $Therapeutic Activity: 8-22 mins       Ramond Dial 09/24/2022, 1:42 PM  Mee Hives, PT PhD Acute Rehab Dept. Number: [place] [facility] [place]

## 2022-09-25 ENCOUNTER — Encounter (HOSPITAL_COMMUNITY): Payer: Self-pay | Admitting: Orthopaedic Surgery

## 2022-09-25 ENCOUNTER — Ambulatory Visit: Payer: Medicare Other | Admitting: Family Medicine

## 2022-09-25 DIAGNOSIS — Z5181 Encounter for therapeutic drug level monitoring: Secondary | ICD-10-CM

## 2022-09-25 DIAGNOSIS — Z7901 Long term (current) use of anticoagulants: Secondary | ICD-10-CM | POA: Diagnosis not present

## 2022-09-25 DIAGNOSIS — Z952 Presence of prosthetic heart valve: Secondary | ICD-10-CM | POA: Diagnosis not present

## 2022-09-25 DIAGNOSIS — S72001A Fracture of unspecified part of neck of right femur, initial encounter for closed fracture: Secondary | ICD-10-CM | POA: Diagnosis not present

## 2022-09-25 LAB — CBC
HCT: 30.1 % — ABNORMAL LOW (ref 36.0–46.0)
Hemoglobin: 9.7 g/dL — ABNORMAL LOW (ref 12.0–15.0)
MCH: 33.1 pg (ref 26.0–34.0)
MCHC: 32.2 g/dL (ref 30.0–36.0)
MCV: 102.7 fL — ABNORMAL HIGH (ref 80.0–100.0)
Platelets: 259 10*3/uL (ref 150–400)
RBC: 2.93 MIL/uL — ABNORMAL LOW (ref 3.87–5.11)
RDW: 14.2 % (ref 11.5–15.5)
WBC: 15.6 10*3/uL — ABNORMAL HIGH (ref 4.0–10.5)
nRBC: 0.1 % (ref 0.0–0.2)

## 2022-09-25 LAB — BASIC METABOLIC PANEL
Anion gap: 11 (ref 5–15)
BUN: 17 mg/dL (ref 8–23)
CO2: 25 mmol/L (ref 22–32)
Calcium: 9 mg/dL (ref 8.9–10.3)
Chloride: 98 mmol/L (ref 98–111)
Creatinine, Ser: 0.82 mg/dL (ref 0.44–1.00)
GFR, Estimated: >60 mL/min (ref >60)
Glucose, Bld: 91 mg/dL (ref 70–99)
Potassium: 3.5 mmol/L (ref 3.5–5.1)
Sodium: 134 mmol/L — ABNORMAL LOW (ref 135–145)

## 2022-09-25 LAB — PROTIME-INR
INR: 1 (ref 0.8–1.2)
Prothrombin Time: 13.2 seconds (ref 11.4–15.2)

## 2022-09-25 LAB — GLUCOSE, CAPILLARY: Glucose-Capillary: 91 mg/dL (ref 70–99)

## 2022-09-25 MED ORDER — LOSARTAN POTASSIUM 25 MG PO TABS
12.5000 mg | ORAL_TABLET | Freq: Every day | ORAL | Status: DC
  Administered 2022-09-25 – 2022-09-29 (×3): 12.5 mg via ORAL
  Filled 2022-09-25 (×5): qty 0.5

## 2022-09-25 MED ORDER — HYDROMORPHONE HCL 1 MG/ML IJ SOLN
1.0000 mg | Freq: Once | INTRAMUSCULAR | Status: AC
  Administered 2022-09-25: 1 mg via INTRAVENOUS
  Filled 2022-09-25: qty 1

## 2022-09-25 MED ORDER — DAPAGLIFLOZIN PROPANEDIOL 10 MG PO TABS
10.0000 mg | ORAL_TABLET | Freq: Every day | ORAL | Status: DC
  Administered 2022-09-25 – 2022-09-29 (×5): 10 mg via ORAL
  Filled 2022-09-25 (×6): qty 1

## 2022-09-25 MED ORDER — DICLOFENAC SODIUM 1 % EX GEL
2.0000 g | Freq: Four times a day (QID) | CUTANEOUS | Status: DC
  Administered 2022-09-25 – 2022-09-29 (×18): 2 g via TOPICAL
  Filled 2022-09-25 (×3): qty 100

## 2022-09-25 MED ORDER — WARFARIN SODIUM 7.5 MG PO TABS
7.5000 mg | ORAL_TABLET | Freq: Once | ORAL | Status: AC
  Administered 2022-09-25: 7.5 mg via ORAL
  Filled 2022-09-25: qty 1

## 2022-09-25 NOTE — Progress Notes (Signed)
Subjective:  She states that she is not doing well. She is having a good amount of pain. She has pain in her knees and hips. She reports working with PT/OT was alright but sh egot tired. She walked to the bathroom, used the potty. She was able to get weight on the leg. She was not able to get out of the room. She reports hjaving a bowel movement and it was big. She denies dyspnea. She report a history of arthritis. Abdomen is still sore from yesterday but less distended. She reports that she has a lot of gas. She notes that the dilaudid helped her and she states she would like more of its. Explained the plan to the patient and answered all of her questions.     Objective:  Vital signs in last 24 hours: Vitals:   09/24/22 1916 09/25/22 0418 09/25/22 0805 09/25/22 0925  BP: 111/71 129/77 (!) 145/64 126/85  Pulse: 68 97 (!) 103 (!) 105  Resp: 18 19 20    Temp: 98.3 F (36.8 C) 98.4 F (36.9 C) 98.7 F (37.1 C)   TempSrc: Oral  Oral   SpO2: 95% 92% 91% 96%  Weight:      Height:       Gen: chronically ill appearing, in pain, laying in bed cooperative CV:RRR, no m/r/g, audible mechanical mitral valve, 2+ bilateral radial pulses Pulm: normal wob on RA, Abdomen: Interval decrease in distension, soft, minimal diffuse ptp lower>upper quadrants, normal active bowel sounds, no rebound or guarding, no palpable masses MSK: No fullness,fluctuance or drainage around R hip surgical site, post op wound dressings in place Extr: warm, dry, no LE edema  Assessment/Plan:  Active Problems:   Chronic combined systolic and diastolic heart failure   HLD (hyperlipidemia)   Essential hypertension   Tobacco use disorder   COPD (chronic obstructive pulmonary disease)   S/P MVR (mitral valve replacement)   Chronic anticoagulation   Osteoarthritis of left knee   Atypical atrial flutter   Closed intertrochanteric fracture of right femur, initial encounter   H/O mitral valve replacement with mechanical  valve   Chronic HFrEF (heart failure with reduced ejection fraction)   Closed fracture of right hip   History of mitral valve replacement with mechanical valve   Anticoagulated   Alteration in anticoagulation  R intertochanteric fracture 2/2 likely mechanical fall S/p orthopedic surgery with R intramedullary nail placement for intertrochanteric fracture 4/3. HDS, hemoglobin stable, no evidence of infection.Currently managing pain with oxy 10mg  q4 prn and dilaudid 1mg  IV prn. Had her first bowel movement yesterday with bisacodyl suppository. PT/OT recommending SNF. Discharge pending acceptance of SNF bed.  -appreciate orthopedic management -F/u with Dr. Roda Shutters 2 weeks post-op for staple removal  -enoxaparin 70mg  BID bridging to warfarin -warfarin per pharmacy -heart healthy diet -PT/OT -oxy 10mg  q4hrs prn -senna, mirilax  S/p mechanical mitral valve replacement On chronic warfarin therapy Performed in [place] in 2001. Recent ECHO on 04/2022 with stable MVR with mean gradient 3. On warfarin for anticoagulation, follows [name]. INR 1.0 this AM after full reversal, will likely take longer to get to therapeutic goal of 2.5-3.5. Reaching out to [name] for warfarin titration options when discharged to SNF. Will continue per pharmacy management of warfarin. -enoxaparin 70mg  BID bridging to warfarin -warfarin per pharmacy -appreciate pharmacy monitoring and management -trend PT/INR, goal INR 2.5-3.5  HTN CAD Chronic combined systolic and diastolic HF ECHO in 04/2022 with EF 35-40%, severe RV dysfunction, stable MVR, severe TVR.  Follows [facility] HF team (Dr. Gala Romney) in the outpatient setting.  She states she is not taking spironolactone at home. BP now around 130-140s systolic will resume losartan and farxiga. -restart losartan 12.5mg  daily -restart farxiga 10mg  daily -continue toprol-xl 25mg  daily -continue torsemide 40mg  daily -trend I/O's   Vitamin D deficiency Osteopenia Patient  with prior history of osteopenia on DEXA and vitamin D deficiency to 5 this admission. Will replete vitamin D, recheck in the outpatient setting, repeat DEXA with potential bisphosphonates of showing osteoporosis. -vitamin D 50K units once weekly  COPD Tobacco use disorder -supplemental O2 prn, maintain O2 saturations >88% and wean off O2 as able -breo ellipta + incruse ellipta (in place of home trelegy)  -home albuterol nebs q6 prn -nicotine patch -continue counseling on smoking cessation   Paroxysmal A-flutter -holding amiodarone for now -continue toprol-xl 25mg  daily   CKD IIIA Baseline Cr appears to be around 1-1.2 with GFRs in the 50s. Creatinine remains stable and at baseline. -daily bmp -monitor UOP   HLD Lipid panel on 02/2022 with LDL 44.  -resumed home zocor 20mg  daily  Prior to Admission Living Arrangement: Anticipated Discharge Location: Barriers to Discharge: Dispo: Anticipated discharge in approximately >2 days day(s).   Willette Cluster, MD 09/25/2022, 11:21 AM Pager: Willette Cluster, MD Internal Medicine Resident, PGY-1 [facility]  Pager: [phone] After 5pm on weekdays and 1pm on weekends: On Call pager [phone]

## 2022-09-25 NOTE — Progress Notes (Signed)
As this is a patient that I usually see in the [facility], I can continue dosing her with warfarin on an outpatient basis while she is at the Skilled Nursing Facility. In your discharge notes/orders to the SNF, please provide my name, phone number, and email address to the facility. Please indicate to the SNF to have the INRs  RESULTED TO ME by any of the options I listed. I recommend having the first PT/INR ordered for MONDAY, 8-APR-24. Since there may be an issue with these labs (PT/INR) being collected and then sent to a "send-out" lab, e.g., [facility], etc., processing time and reporting time may add time delay. Please indicate to the SNF that they can call me with the results. Per my CPP provider status, I can provide new warfarin dosing instructions and discontinue her enoxaparin/Lovenox "Bridge" once her INR exceeds 2.5. I would continue the 7.5-milligram dosing of warfarin--today, Saturday, and Sunday--with PT/INR to be collected on Monday, 8-APR-24. As part of the pharmacy consult, I would continue the enoxaparin/Lovenox as currently ordered [facility] reflects 75 milligrams of enoxaparin/Lovenox, SQ q12 hours). CONTINUE THIS until the INR is drawn on  Monday, 8-APR-24, which is resulted to me. We will continue the "bridge" therapy with overlapping enoxaparin and warfarin until such time her INR is in the range of 2.5 - 3.5. Once discharged from the SNF, I will continue to manage her warfarin in the [facility] either with in-person visits or telephonically, as her daughter [name]) has a finger-stick, point of care device by which she reports her mother's INR values to me for warfarin dose adjustment.   Thank you, Hulen Luster, PharmD, CPP

## 2022-09-25 NOTE — TOC Progression Note (Addendum)
Transition of Care [facility]) - Progression Note    Patient Details  Name: Kim Anthony [name]: [id number] Date of Birth: 1951-01-14  Transition of Care [facility]) CM/SW Contact  Lorri Frederick, LCSW Phone Number: 09/25/2022, 9:51 AM  Clinical Narrative:   Bed offers provided to pt and to daughter.  Daughter requesting a response from [place], CSW contacted that facility.  Daughter will review and call back with choice.  1555: TC Monica/daughter: she is going to visit [facility] today, no SNF choice yet.  Expected Discharge Plan: Skilled Nursing Facility Barriers to Discharge: SNF Pending bed offer  Expected Discharge Plan and Services In-house Referral: Clinical Social Work   Post Acute Care Choice: Skilled Nursing Facility Living arrangements for the past 2 months: Single Family Home                                       Social Determinants of Health (SDOH) Interventions SDOH Screenings   Food Insecurity: No Food Insecurity (09/21/2022)  Housing: Low Risk  (09/21/2022)  Transportation Needs: No Transportation Needs (09/21/2022)  Utilities: Not At Risk (09/21/2022)  Recent Concern: Utilities - At Risk (07/03/2022)  Depression (PHQ2-9): Low Risk  (07/02/2022)  Financial Resource Strain: Medium Risk (02/27/2021)  Social Connections: Socially Isolated (11/28/2019)  Tobacco Use: High Risk (09/23/2022)    Readmission Risk Interventions    02/19/2022    3:36 PM  Readmission Risk Prevention Plan  Transportation Screening Complete  PCP or Specialist Appt within 3-5 Days Complete  HRI or Home Care Consult Complete  Social Work Consult for Recovery Care Planning/Counseling Complete  Palliative Care Screening Not Applicable  Medication Review [employer]) Complete

## 2022-09-25 NOTE — Consult Note (Signed)
   [facility] Inpatient Consult   09/25/2022  LORRAIN DURU 1950-08-21 [id number]  [facility] [[facility]]  Accountable Care Organization [ACO] Patient: [employer] Medicare  Primary Care Provider: Willette Cluster, MD with [employer] [facility] is listed to provide the transition of care   Patient was assessed for [facility] [[facility]] Care Management for community services. Patient was previously active with [facility] Care Management.  Met with patient at bedside regarding being restarted with [facility] services.   11:45 am Met with patient at the bedside. Explained reason for visit and if she wanted to restart services with [facility] for care coordination.  Patient endorses PCP and states she and her daughter are still trying to decide which SNF for rehab to go to.    Plan:  Will continue to follow for disposition. If patient goes to a [facility] affiliated facility will request that the [facility] RN follow for returning to community for any care coordination needs from facility.  Of note, [facility] Care Management services does not replace or interfere with any services that are arranged by inpatient [facility] care management team.   For additional questions or referrals please contact:   Charlesetta Shanks, RN BSN CCM [facility]  [phone] business mobile phone Toll free office [phone]  *Concierge Line  [phone] Fax number: [phone] [email or web address]

## 2022-09-25 NOTE — Progress Notes (Signed)
ANTICOAGULATION CONSULT NOTE - Follow Up Consult  Pharmacy Consult for Warfarin and Enoxaparin Indication:  mechanical MVR, atrial fibrillation and hx PE/DVT  Allergies  Allergen Reactions   Lisinopril Swelling   Acetaminophen-Codeine Itching   Propoxyphene Itching    Darvocet   Tape Rash    Plastic   Tramadol Itching, Nausea And Vomiting and Rash    Patient Measurements: Height: 5\' 5"  (165.1 cm) Weight: 69.9 kg (154 lb) IBW/kg (Calculated) : 57 Enoxaparin Dosing Weight: 69.9 kg  Vital Signs: Temp: 98.4 F (36.9 C) (04/05 0418) BP: 129/77 (04/05 0418) Pulse Rate: 97 (04/05 0418)  Labs: Recent Labs    09/22/22 1840 09/22/22 2030 09/23/22 0703 09/24/22 0711 09/24/22 1133 09/24/22 1639 09/25/22 0325  HGB   < >  --  11.0*  --  9.4* 10.0* 9.7*  HCT   < >  --  34.2*  --  29.4* 31.9* 30.1*  PLT  --   --  236  --  227  --  259  APTT  --  39*  --   --   --   --   --   LABPROT  --   --  14.7 14.3  --   --  13.2  INR  --   --  1.2 1.1  --   --  1.0  HEPARINUNFRC  --   --   --   --  <0.10*  --   --   CREATININE  --   --  0.90  --  0.78  --  0.82   < > = values in this interval not displayed.    Estimated Creatinine Clearance: 60.9 mL/min (by C-G formula based on SCr of 0.82 mg/dL).  Assessment: 21 YOF presenting 09/21/22 with R hip fracture 2/2 fall on warfarin PTA with INR on admission 3.3. Pt has a history of PE/DVT, mechanical MVR, and Afib and was taking warfarin 5 mg po daily prior to admission. Last dose of warfarin was taken on 09/20/22 at 17:30. Warfarin held for surgery and Vitamin K given. Pharmacy consulted to initiate heparin bridging once INR < 2.5.  Heparin held for OR 4/3.  Warfarin resumed post-op 4/3 pm, s/p R IM nai.  Heparin changed to full-dose Enoxaparin on 4/4 pm due to IV issues. INR 1.0 after Warfarin 7.5 mg x 2 days.  S/p total 15 mg IV Vitamin K on 4/2 and may take some time for INR to trend back up.  PTA Warfarin regimen: 5 mg daily.  Goal of  Therapy:  INR 2.5-3.5 Anti-Xa level 0.6-1 units/ml 4hrs after LMWH dose given Monitor platelets by anticoagulation protocol: Yes   Plan:  Warfarin 7.5 mg x 1 again today. Enoxaparin 75 mg SQ q12h Daily PT/INR. CBC in am.  Dennie Fetters, [place] 09/25/2022,8:43 AM

## 2022-09-25 NOTE — Progress Notes (Signed)
Physical Therapy Treatment Patient Details Name: Kim Anthony [name]: [id number] DOB: 1950-08-07 Today's Date: 09/25/2022   History of Present Illness 72 y.o. female presenting 09/21/2022 with R hip pain and swelling after mechanical fall on her porch. Pt found to have right hip IT fracture. Now s/p intertrochanteric intramedullary nailing 4/3. PMH significant for COPD, HTN, CHF, mechanical MVR on warfarin, pAF, PE/DVT, breast cancer.    PT Comments    Pt was seen in supine and agreeable to session with encouragement. Pt required significant increased time to perform mobility tasks due to pain and weakness. Pt demonstrated difficulty with sequencing bed mobility requiring up to mod A. Pt initially unable unweight bottom to scoot towards [facility], however was able to after a few attempts. Pt able to stand at EOB with mod A for power up, but was unable to weight shift in order to take lateral steps. Pt able to minimally scoot feet before needing to sit due to fatigue. Pt continues to benefit from PT services to progress toward functional mobility goals.    Recommendations for follow up therapy are one component of a multi-disciplinary discharge planning process, led by the attending physician.  Recommendations may be updated based on patient status, additional functional criteria and insurance authorization.  Follow Up Recommendations  Can patient physically be transported by private vehicle: No    Assistance Recommended at Discharge Frequent or constant Supervision/Assistance  Patient can return home with the following A lot of help with walking and/or transfers;A lot of help with bathing/dressing/bathroom;Assistance with cooking/housework;Direct supervision/assist for medications management;Direct supervision/assist for financial management;Assist for transportation;Help with stairs or ramp for entrance   Equipment Recommendations  None recommended by PT    Recommendations for Other Services        Precautions / Restrictions Precautions Precautions: Fall Precaution Comments: premedicate Restrictions Weight Bearing Restrictions: Yes RLE Weight Bearing: Weight bearing as tolerated     Mobility  Bed Mobility Overal bed mobility: Needs Assistance Bed Mobility: Supine to Sit, Sit to Supine     Supine to sit: Mod assist, HOB elevated Sit to supine: Mod assist   General bed mobility comments: Mod A for RLE advancement and scooting hips to EOB. Mod A for BLE elevation to EOB. Pt able to lateral scoot towards [facility] with cues for technique    Transfers Overall transfer level: Needs assistance Equipment used: Rolling walker (2 wheels) Transfers: Sit to/from Stand Sit to Stand: Mod assist, From elevated surface           General transfer comment: Mod A to power up from elevated EOB to RW. Cues to transition hand to RW.    Ambulation/Gait             Pre-gait activities: attempted lateral steps at EOB General Gait Details: Pt unable to take steps at EOB       Balance Overall balance assessment: Needs assistance, History of Falls Sitting-balance support: Feet supported Sitting balance-Leahy Scale: Fair Sitting balance - Comments: sitting EOB   Standing balance support: Bilateral upper extremity supported, During functional activity Standing balance-Leahy Scale: Poor Standing balance comment: with RW support                            Cognition Arousal/Alertness: Awake/alert Behavior During Therapy: Anxious, WFL for tasks assessed/performed Overall Cognitive Status: Within Functional Limits for tasks assessed  Exercises      General Comments        Pertinent Vitals/Pain Pain Assessment Pain Assessment: Faces Faces Pain Scale: Hurts whole lot Pain Location: R hip with mobility and back Pain Descriptors / Indicators: Guarding, Grimacing, Aching, Operative site guarding Pain  Intervention(s): Limited activity within patient's tolerance, Monitored during session, Repositioned     PT Goals (current goals can now be found in the care plan section) Acute Rehab PT Goals Patient Stated Goal: to get home and walk PT Goal Formulation: With patient/family Time For Goal Achievement: 10/08/22 Potential to Achieve Goals: Good Progress towards PT goals: Progressing toward goals    Frequency    Min 3X/week      PT Plan Current plan remains appropriate       AM-PAC PT "6 Clicks" Mobility   Outcome Measure  Help needed turning from your back to your side while in a flat bed without using bedrails?: A Lot Help needed moving from lying on your back to sitting on the side of a flat bed without using bedrails?: A Lot Help needed moving to and from a bed to a chair (including a wheelchair)?: Total Help needed standing up from a chair using your arms (e.g., wheelchair or bedside chair)?: A Lot Help needed to walk in hospital room?: Total Help needed climbing 3-5 steps with a railing? : Total 6 Click Score: 9    End of Session Equipment Utilized During Treatment: Gait belt Activity Tolerance: Patient limited by fatigue;Patient limited by pain Patient left: in bed;with nursing/sitter in room;with call bell/phone within reach Nurse Communication: Mobility status PT Visit Diagnosis: Unsteadiness on feet (R26.81);Muscle weakness (generalized) (M62.81);Pain;Difficulty in walking, not elsewhere classified (R26.2);History of falling (Z91.81)     Time: 7001-7494 PT Time Calculation (min) (ACUTE ONLY): 39 min  Charges:  $Therapeutic Activity: 38-52 mins                     Johny Shock, PTA Acute Rehabilitation Services Secure Chat Preferred  [phone]    Johny Shock 09/25/2022, 3:24 PM

## 2022-09-25 NOTE — Progress Notes (Signed)
Subjective: 2 Days Post-Op Procedure(s) (LRB): INTRAMEDULLARY (IM) NAIL INTERTROCHANTERIC (N/A) Patient reports pain as moderate.    Objective: Vital signs in last 24 hours: Temp:  [98.3 F (36.8 C)-98.4 F (36.9 C)] 98.4 F (36.9 C) (04/05 0418) Pulse Rate:  [68-97] 97 (04/05 0418) Resp:  [14-19] 19 (04/05 0418) BP: (101-129)/(71-78) 129/77 (04/05 0418) SpO2:  [92 %-100 %] 92 % (04/05 0418)  Intake/Output from previous day: 04/04 0701 - 04/05 0700 In: 480 [P.O.:480] Out: 800 [Urine:800] Intake/Output this shift: No intake/output data recorded.  Recent Labs    09/22/22 0842 09/23/22 0703 09/24/22 1133 09/24/22 1639 09/25/22 0325  HGB 11.6* 11.0* 9.4* 10.0* 9.7*   Recent Labs    09/24/22 1133 09/24/22 1639 09/25/22 0325  WBC 19.3*  --  15.6*  RBC 2.81*  --  2.93*  HCT 29.4* 31.9* 30.1*  PLT 227  --  259   Recent Labs    09/24/22 1133 09/25/22 0325  NA 134* 134*  K 3.9 3.5  CL 100 98  CO2 24 25  BUN 14 17  CREATININE 0.78 0.82  GLUCOSE 131* 91  CALCIUM 9.1 9.0   Recent Labs    09/24/22 0711 09/25/22 0325  INR 1.1 1.0    Neurologically intact Neurovascular intact Sensation intact distally Intact pulses distally Dorsiflexion/Plantar flexion intact Incision: dressing C/D/I No cellulitis present Compartment soft   Assessment/Plan: 2 Days Post-Op Procedure(s) (LRB): INTRAMEDULLARY (IM) NAIL INTERTROCHANTERIC (N/A) Weightbearing: WBAT RLE Insicional and dressing care: Dressings left intact until follow-up Orthopedic device(s): None Showering: keep bandages covered.   VTE prophylaxis: Lovenox/coumadin Pain control: percocet Follow - up plan: 2 weeks Contact information:  xu MD, Dub Mikes PA       Cristie Hem 09/25/2022, 7:47 AM

## 2022-09-26 LAB — BASIC METABOLIC PANEL
Anion gap: 14 (ref 5–15)
BUN: 17 mg/dL (ref 8–23)
CO2: 27 mmol/L (ref 22–32)
Calcium: 9.1 mg/dL (ref 8.9–10.3)
Chloride: 91 mmol/L — ABNORMAL LOW (ref 98–111)
Creatinine, Ser: 0.95 mg/dL (ref 0.44–1.00)
GFR, Estimated: >60 mL/min (ref >60)
Glucose, Bld: 122 mg/dL — ABNORMAL HIGH (ref 70–99)
Potassium: 3.2 mmol/L — ABNORMAL LOW (ref 3.5–5.1)
Sodium: 132 mmol/L — ABNORMAL LOW (ref 135–145)

## 2022-09-26 LAB — PROTIME-INR
INR: 1.1 (ref 0.8–1.2)
Prothrombin Time: 13.8 seconds (ref 11.4–15.2)

## 2022-09-26 LAB — CBC
HCT: 30 % — ABNORMAL LOW (ref 36.0–46.0)
Hemoglobin: 9.6 g/dL — ABNORMAL LOW (ref 12.0–15.0)
MCH: 32.8 pg (ref 26.0–34.0)
MCHC: 32 g/dL (ref 30.0–36.0)
MCV: 102.4 fL — ABNORMAL HIGH (ref 80.0–100.0)
Platelets: 311 10*3/uL (ref 150–400)
RBC: 2.93 MIL/uL — ABNORMAL LOW (ref 3.87–5.11)
RDW: 14.5 % (ref 11.5–15.5)
WBC: 17.7 10*3/uL — ABNORMAL HIGH (ref 4.0–10.5)
nRBC: 0.2 % (ref 0.0–0.2)

## 2022-09-26 MED ORDER — WARFARIN SODIUM 7.5 MG PO TABS
7.5000 mg | ORAL_TABLET | Freq: Every day | ORAL | Status: DC
  Administered 2022-09-26 – 2022-09-29 (×4): 7.5 mg via ORAL
  Filled 2022-09-26 (×4): qty 1

## 2022-09-26 MED ORDER — KETOROLAC TROMETHAMINE 15 MG/ML IJ SOLN
15.0000 mg | Freq: Once | INTRAMUSCULAR | Status: AC
  Administered 2022-09-26: 15 mg via INTRAVENOUS
  Filled 2022-09-26: qty 1

## 2022-09-26 MED ORDER — POTASSIUM CHLORIDE CRYS ER 20 MEQ PO TBCR
40.0000 meq | EXTENDED_RELEASE_TABLET | Freq: Once | ORAL | Status: AC
  Administered 2022-09-26: 40 meq via ORAL
  Filled 2022-09-26: qty 2

## 2022-09-26 MED ORDER — LIDOCAINE 5 % EX PTCH
1.0000 | MEDICATED_PATCH | CUTANEOUS | Status: DC
  Administered 2022-09-26 – 2022-09-29 (×5): 1 via TRANSDERMAL
  Filled 2022-09-26 (×4): qty 1

## 2022-09-26 NOTE — Progress Notes (Signed)
Subjective: Paged by nursing staff for left shoulder pain. Our team went to reassess the patient. Upon arrival, patient was resting in bed.  Appears uncomfortable.  Reports intermittent left shoulder pain over the last few days. She is unsure of what started the pain. Denies falling or injuring this shoulder.  Reports that her current pain regimen is not helping her shoulder pain.    Objective: Constitutional: chronically ill-appearing, appears uncomfortable  Musculoskeletal: Tenderness to palpation of the left shoulder posteriorly, superiorly, and anteriorly. Pain elicited w/ passive ROM of the left shoulder. Normal active and passive range of motion of the left shoulder.     Neurological: Alert and oriented to person, place, and time. Non-focal. Skin: warm and dry.   Plan: Patient has long-standing history of chronic pain, mostly from arthritis. Prior to her hospitalization, patient was taking hydrocodone-acetaminophen 5-325 mg q4 hours PRN, duloxetine 20 mg daily, and gabapentin 900 mg TID at home. Current pain regimen while hospitalized includes tylenol PRN q6 hours, oxycodone 10 mg q4 hours PRN, methocarbamol 500 mg q6 hours PRN, gabapentin 600 mg TID, duloxetine 20 mg daily, voltaren gel. Patient has also received 3X doses of IV dilaudid 1 mg today for pain. Received 3X doses of methocarbamol today. Last received oxycodone at 2100. Last received tylenol at 2200. Last received dilaudid at 2300. Discussed with patient that given the amount of opioid medication she has received, would like to avoid further opioid therapy at this time to prevent sedation or respiratory compromise. Normal work of breathing on room air at this time. Renal function is good. Will attempt to treat pain conservatively through a multimodal approach.  -1X IV toradol 15 mg -Lidocaine patch -Heating pad  Karoline Caldwell, MD 09/26/2022, 12:09 AM

## 2022-09-26 NOTE — Progress Notes (Signed)
ANTICOAGULATION CONSULT NOTE - Follow Up Consult  Pharmacy Consult for Warfarin and Enoxaparin Indication:  mechanical MVR, atrial fibrillation and hx PE/DVT  Allergies  Allergen Reactions   Lisinopril Swelling   Acetaminophen-Codeine Itching   Propoxyphene Itching    Darvocet   Tape Rash    Plastic   Tramadol Itching, Nausea And Vomiting and Rash    Patient Measurements: Height: 5\' 5"  (165.1 cm) Weight: 69.9 kg (154 lb) IBW/kg (Calculated) : 57 Enoxaparin Dosing Weight: 69.9 kg  Vital Signs: Temp: 97.8 F (36.6 C) (04/06 0720) Temp Source: Oral (04/06 0720) BP: 102/71 (04/06 0720) Pulse Rate: 76 (04/06 0833)  Labs: Recent Labs    09/24/22 0711 09/24/22 1133 09/24/22 1133 09/24/22 1639 09/25/22 0325 09/26/22 0227  HGB  --  9.4*   < > 10.0* 9.7* 9.6*  HCT  --  29.4*   < > 31.9* 30.1* 30.0*  PLT  --  227  --   --  259 311  LABPROT 14.3  --   --   --  13.2 13.8  INR 1.1  --   --   --  1.0 1.1  HEPARINUNFRC  --  <0.10*  --   --   --   --   CREATININE  --  0.78  --   --  0.82 0.95   < > = values in this interval not displayed.     Estimated Creatinine Clearance: 52.6 mL/min (by C-G formula based on SCr of 0.95 mg/dL).  Assessment: 30 YOF presenting 09/21/22 with R hip fracture 2/2 fall on warfarin PTA with INR on admission 3.3. Pt has a history of PE/DVT, mechanical MVR, and Afib and was taking warfarin 5 mg po daily prior to admission. Last dose of warfarin was taken on 09/20/22 at 17:30. Warfarin held for surgery and Vitamin K given. Pharmacy consulted to initiate heparin bridging once INR < 2.5.  Heparin held for OR 4/3.  Warfarin resumed post-op 4/3 pm, s/p R IM nai.  Heparin changed to full-dose Enoxaparin on 4/4 pm due to IV issues. INR 1.1 after Warfarin 7.5 mg x 3 days.  S/p total 15 mg IV Vitamin K on 4/2 and may take some time for INR to trend back up. CBC stable. *Noted plan for [name], [name] to follow-up warfarin dosing as outpatient, as previously  managing [facility].   PTA Warfarin regimen: 5 mg daily.  Goal of Therapy:  INR 2.5-3.5 Anti-Xa level 0.6-1 units/ml 4hrs after LMWH dose given Monitor platelets by anticoagulation protocol: Yes   Plan:  Warfarin 7.5 mg daily for now.  Enoxaparin 70 mg SQ q12h until INR at goal Daily PT/INR. Intermittent CBC. Outpatient follow up with [name], PharmD  Dennie Fetters, [place] 09/26/2022,10:03 AM

## 2022-09-26 NOTE — Progress Notes (Signed)
Subjective:  Patient evaluated a the bedside this AM. Reporting sharp shoulder pain that started yesterday, seen by overnight team for this. Hurting at rest and with movement. No numbness or tingling into the arm. Never had this on the L side before but has had it on the R. Says she didn't remember hurting her arm or using it much with therapy. Hip pain doing better. Breathing a little worse overnight. Continues to eat well and have bowel movements. Says her daughter found a SNF yesterday but werent open to do paper work.    Objective:  Vital signs in last 24 hours: Vitals:   09/25/22 2210 09/26/22 0630 09/26/22 0720 09/26/22 0833  BP: 127/69 119/69 102/71   Pulse: 92 75 80 76  Resp: 19 18 19 16   Temp: 98.2 F (36.8 C) 97.8 F (36.6 C) 97.8 F (36.6 C)   TempSrc: Oral Oral Oral   SpO2: 93% 100% 100% 99%  Weight:      Height:       Gen: uncomfortable, laying in bed cooperative CV:RRR, no m/r/g, audible mechanical mitral valve, 2+ bilateral radial pulses Pulm: normal wob on RA, Abdomen: normal active bowel sounds, soft, no rebound or guarding, no palpable masses MSK: No fullness,fluctuance or drainage around R hip surgical site, post op wound dressings in place. L shoulder with minimal active range of motion. Grossly normal passive rang of motion in all directions. Pain along the L bicep and anterior shoulder. No clicking, crepitus. No deformity, warmth or effusion. 5/5 strength at the the L bicep and tricep, unable to assess shoulder strength given pain. Extr: warm, dry, no LE edema  Assessment/Plan:  Active Problems:   Chronic combined systolic and diastolic heart failure   HLD (hyperlipidemia)   Essential hypertension   Tobacco use disorder   COPD (chronic obstructive pulmonary disease)   S/P MVR (mitral valve replacement)   Chronic anticoagulation   Osteoarthritis of left knee   Atypical atrial flutter   Closed intertrochanteric fracture of right femur, initial  encounter   H/O mitral valve replacement with mechanical valve   Chronic HFrEF (heart failure with reduced ejection fraction)   Closed fracture of right hip   History of mitral valve replacement with mechanical valve   Anticoagulated   Alteration in anticoagulation  R intertochanteric fracture 2/2 likely mechanical fall S/p orthopedic surgery with R intramedullary nail placement for intertrochanteric fracture 4/3. HDS, hemoglobin stable, no evidence of infection.Currently managing pain with oxy 10mg  q4 prn and dilaudid 1mg  IV prn. Discharge pending acceptance of SNF bed.  -appreciate orthopedic management -F/u with Dr. Roda Shutters 2 weeks post-op for staple removal  -enoxaparin 70mg  BID bridging to warfarin -warfarin per pharmacy -heart healthy diet -PT/OT -oxy 10mg  q4hrs prn -senna, mirilax  S/p mechanical mitral valve replacement On chronic warfarin therapy Performed in [place] in 2001. Recent ECHO on 04/2022 with stable MVR with mean gradient 3. On warfarin for anticoagulation, follows [name]. INR 1.0 this AM after full reversal, will likely take longer to get to therapeutic goal of 2.5-3.5. Per [name] will continue warfarin 7.5 mg daily until Monday. Once patient at SNF can have INR transmitted to [name] for management, although he expects patient to be quite warfarin resistant given full reversal with vitamin K -enoxaparin 70mg  BID bridging to warfarin -warfarin per pharmacy -appreciate pharmacy monitoring and management -trend PT/INR, goal INR 2.5-3.5  Leukocytosis WBC elevated from 15.6 to 17.7. Had some SOB overnight but otherwise satting within goal range  and afebrile. No urinary sxs. No surgical site evidence of infection. No cough or CP to suggest pneumonia. May be transient changes after surgery, will continue to monitor. Can consider cxr tomorrow if persistently elevated. -daily cbc  L shoulder pain Patient reports sharp L shoulder pain that is new without any trauma or  known inciting event. Grossly reduced active ROM in the L shoulder, full passive ROM. PTP of the bicep and anterior shoulder. 5/5 strength at elbow joint, shoulder joint strength limited by pain. No deformities or crepitus of the joint. This may be biceps tendonitis or rotator cuff tendonitis. Could also be an OA flair with increased activity with PT, although no history of this or evidence of OA on radiograph 06/2018. Do not think this is a fracture with recent fall given delayed presentation.. No imaging at this time. -lidocaine patch -voltaren gel -pain regimen as above  HTN CAD Chronic combined systolic and diastolic HF ECHO in 04/2022 with EF 35-40%, severe RV dysfunction, stable MVR, severe TVR. Follows [facility] HF team (Dr. Gala Romney) in the outpatient setting.  She states she is not taking spironolactone at home. BP now around 130-140s systolic will resume losartan and farxiga. -Continue losartan 12.5mg  daily -Continue farxiga 10mg  daily -continue toprol-xl 25mg  daily -continue torsemide 40mg  daily -trend I/O's   Vitamin D deficiency Osteopenia Patient with prior history of osteopenia on DEXA and vitamin D deficiency to 5 this admission. Will replete vitamin D, recheck in the outpatient setting, repeat DEXA with potential bisphosphonates of showing osteoporosis. -vitamin D 50K units once weekly  COPD Tobacco use disorder -supplemental O2 prn, maintain O2 saturations >88% and wean off O2 as able -breo ellipta + incruse ellipta (in place of home trelegy)  -home albuterol nebs q6 prn -nicotine patch -continue counseling on smoking cessation   Paroxysmal A-flutter -holding amiodarone for now -continue toprol-xl 25mg  daily   CKD IIIA Baseline Cr appears to be around 1-1.2 with GFRs in the 50s. Creatinine remains stable and at baseline. -daily bmp -monitor UOP   HLD Lipid panel on 02/2022 with LDL 44.  -resumed home zocor 20mg  daily  Prior to Admission Living  Arrangement: Anticipated Discharge Location: Barriers to Discharge: Dispo: Anticipated discharge in approximately >2 days day(s).   Willette Cluster, MD 09/26/2022, 9:53 AM Pager: Willette Cluster, MD Internal Medicine Resident, PGY-1 [facility]  Pager: [phone] After 5pm on weekdays and 1pm on weekends: On Call pager [phone]

## 2022-09-26 NOTE — TOC Progression Note (Signed)
Transition of Care [facility]) - Progression Note    Patient Details  Name: Kim Anthony [name]: [id number] Date of Birth: Jul 20, 1950  Transition of Care [facility]) CM/SW Contact  Jimmy Picket, [place] Phone Number: 09/26/2022, 10:17 AM  Clinical Narrative:     CSW spoke to daughter, [name] about bed offers. [name] states she is going to [facility] to see if she can do a tour and asks if someone can follow up Monday.   Expected Discharge Plan: Skilled Nursing Facility Barriers to Discharge: SNF Pending bed offer  Expected Discharge Plan and Services In-house Referral: Clinical Social Work   Post Acute Care Choice: Skilled Nursing Facility Living arrangements for the past 2 months: Single Family Home                                       Social Determinants of Health (SDOH) Interventions SDOH Screenings   Food Insecurity: No Food Insecurity (09/21/2022)  Housing: Low Risk  (09/21/2022)  Transportation Needs: No Transportation Needs (09/21/2022)  Utilities: Not At Risk (09/21/2022)  Recent Concern: Utilities - At Risk (07/03/2022)  Depression (PHQ2-9): Low Risk  (07/02/2022)  Financial Resource Strain: Medium Risk (02/27/2021)  Social Connections: Socially Isolated (11/28/2019)  Tobacco Use: High Risk (09/25/2022)    Readmission Risk Interventions    02/19/2022    3:36 PM  Readmission Risk Prevention Plan  Transportation Screening Complete  PCP or Specialist Appt within 3-5 Days Complete  HRI or Home Care Consult Complete  Social Work Consult for Recovery Care Planning/Counseling Complete  Palliative Care Screening Not Applicable  Medication Review [employer]) Complete

## 2022-09-27 DIAGNOSIS — S72001A Fracture of unspecified part of neck of right femur, initial encounter for closed fracture: Secondary | ICD-10-CM | POA: Diagnosis not present

## 2022-09-27 DIAGNOSIS — Z952 Presence of prosthetic heart valve: Secondary | ICD-10-CM | POA: Diagnosis not present

## 2022-09-27 DIAGNOSIS — Z7901 Long term (current) use of anticoagulants: Secondary | ICD-10-CM | POA: Diagnosis not present

## 2022-09-27 LAB — BASIC METABOLIC PANEL
Anion gap: 14 (ref 5–15)
BUN: 29 mg/dL — ABNORMAL HIGH (ref 8–23)
CO2: 25 mmol/L (ref 22–32)
Calcium: 8.9 mg/dL (ref 8.9–10.3)
Chloride: 92 mmol/L — ABNORMAL LOW (ref 98–111)
Creatinine, Ser: 1.02 mg/dL — ABNORMAL HIGH (ref 0.44–1.00)
GFR, Estimated: 58 mL/min — ABNORMAL LOW (ref >60)
Glucose, Bld: 93 mg/dL (ref 70–99)
Potassium: 3.6 mmol/L (ref 3.5–5.1)
Sodium: 131 mmol/L — ABNORMAL LOW (ref 135–145)

## 2022-09-27 LAB — CBC
HCT: 17.4 % — ABNORMAL LOW (ref 36.0–46.0)
Hemoglobin: 5.5 g/dL — CL (ref 12.0–15.0)
MCH: 33.3 pg (ref 26.0–34.0)
MCHC: 31.6 g/dL (ref 30.0–36.0)
MCV: 105.5 fL — ABNORMAL HIGH (ref 80.0–100.0)
Platelets: 420 10*3/uL — ABNORMAL HIGH (ref 150–400)
RBC: 1.65 MIL/uL — ABNORMAL LOW (ref 3.87–5.11)
RDW: 14.6 % (ref 11.5–15.5)
WBC: 15.6 10*3/uL — ABNORMAL HIGH (ref 4.0–10.5)
nRBC: 0 % (ref 0.0–0.2)

## 2022-09-27 LAB — HEMOGLOBIN AND HEMATOCRIT, BLOOD
HCT: 31 % — ABNORMAL LOW (ref 36.0–46.0)
Hemoglobin: 9.8 g/dL — ABNORMAL LOW (ref 12.0–15.0)

## 2022-09-27 LAB — PROTIME-INR
INR: 1.3 — ABNORMAL HIGH (ref 0.8–1.2)
Prothrombin Time: 15.9 seconds — ABNORMAL HIGH (ref 11.4–15.2)

## 2022-09-27 NOTE — Progress Notes (Signed)
ANTICOAGULATION CONSULT NOTE - Follow Up Consult  Pharmacy Consult for Warfarin and Enoxaparin Indication:  mechanical MVR, atrial fibrillation and hx PE/DVT  Allergies  Allergen Reactions   Lisinopril Swelling   Acetaminophen-Codeine Itching   Propoxyphene Itching    Darvocet   Tape Rash    Plastic   Tramadol Itching, Nausea And Vomiting and Rash    Patient Measurements: Height: 5\' 5"  (165.1 cm) Weight: 67.3 kg (148 lb 5.9 oz) IBW/kg (Calculated) : 57 Enoxaparin Dosing Weight: 67.3 kg  Vital Signs: Temp: 98 F (36.7 C) (04/07 0814) Temp Source: Oral (04/07 0536) BP: 104/60 (04/07 1000) Pulse Rate: 79 (04/07 1000)  Labs: Recent Labs    09/25/22 0325 09/26/22 0227 09/27/22 0341 09/27/22 0716 09/27/22 0847  HGB 9.7* 9.6*  --  5.5* 9.8*  HCT 30.1* 30.0*  --  17.4* 31.0*  PLT 259 311  --  420*  --   LABPROT 13.2 13.8 15.9*  --   --   INR 1.0 1.1 1.3*  --   --   CREATININE 0.82 0.95  --   --   --      Estimated Creatinine Clearance: 48.2 mL/min (by C-G formula based on SCr of 0.95 mg/dL).  Assessment: 89 YOF presenting 09/21/22 with R hip fracture 2/2 fall on warfarin PTA with INR on admission 3.3. Pt has a history of PE/DVT, mechanical MVR, and Afib and was taking warfarin 5 mg po daily prior to admission. Last dose of warfarin was taken on 09/20/22 at 17:30. Warfarin held for surgery and Vitamin K given. Pharmacy consulted to initiate heparin bridging once INR < 2.5.  Heparin held for OR 4/3.  Warfarin resumed post-op 4/3 pm, s/p R IM nai.  Heparin changed to full-dose Enoxaparin on 4/4 pm due to IV issues. INR 1.3 after Warfarin 7.5 mg x 4 days.  S/p total 15 mg IV Vitamin K on 4/2 and may take some time for INR to trend back up. Hgb down this am but rechecked and consistent with prior values.  *Noted plan for [name], [name] to follow-up warfarin dosing as outpatient, as previously managing [facility] dosing [facility].   PTA Warfarin regimen: 5 mg  daily.  Goal of Therapy:  INR 2.5-3.5 Anti-Xa level 0.6-1 units/ml 4hrs after LMWH dose given Monitor platelets by anticoagulation protocol: Yes   Plan:  Warfarin 7.5 mg daily for now.  Enoxaparin 70 mg SQ q12h until INR at goal Daily PT/INR. Intermittent CBC. Outpatient follow up with [name], PharmD  Dennie Fetters, [place] 09/27/2022,11:36 AM

## 2022-09-27 NOTE — Progress Notes (Signed)
Patient hemoglobin read a critical result this morning, MD made aware and ordered a redraw. Patient redraw resulted back at 9.8. MD in room and made aware of new results. No further orders given at this time. Will continue to monitor and follow plan of care.

## 2022-09-27 NOTE — Progress Notes (Addendum)
Subjective:  Patient evaluated a the bedside this AM. Overall pain improved in shoulder and hip, able to move around some. Feels like it's loosening up some. Some cough, more than usual but normal color (yellow phlegm). No chest pain. Didn't have a BM yesterday. Miralax helped yesterday. Daughter went yesterday to SNF, snow waiting on insurance     Objective:  Vital signs in last 24 hours: Vitals:   09/27/22 0500 09/27/22 0536 09/27/22 0814 09/27/22 1000  BP:  96/61 102/64 104/60  Pulse:  84 80 79  Resp:  18 18   Temp:  98.3 F (36.8 C) 98 F (36.7 C)   TempSrc:  Oral    SpO2:  (!) 89% 96%   Weight: 67.3 kg     Height:       Gen: no acute distress laying in bed cooperative CV:RRR, no m/r/g, audible mechanical mitral valve, 2+ bilateral radial pulses Pulm: normal wob on RA, bibasilar crackles Abdomen:hypoactive bowel sounds, soft, no rebound or guarding, no palpable masses MSK: No fullness,fluctuance or drainage around R hip surgical site, post op wound dressings in place. Extr: warm, dry, no LE edema  Assessment/Plan:  Active Problems:   Chronic combined systolic and diastolic heart failure   HLD (hyperlipidemia)   Essential hypertension   Tobacco use disorder   COPD (chronic obstructive pulmonary disease)   S/P MVR (mitral valve replacement)   Chronic anticoagulation   Osteoarthritis of left knee   Atypical atrial flutter   Closed intertrochanteric fracture of right femur, initial encounter   H/O mitral valve replacement with mechanical valve   Chronic HFrEF (heart failure with reduced ejection fraction)   Closed fracture of right hip   History of mitral valve replacement with mechanical valve   Anticoagulated   Alteration in anticoagulation  R intertochanteric fracture 2/2 likely mechanical fall S/p orthopedic surgery with R intramedullary nail placement for intertrochanteric fracture 4/3. HDS, hemoglobin stable, no evidence of infection.Currently managing pain  with oxy 10mg  q4 prn and dilaudid 1mg  IV prn. Discharge pending acceptance of SNF bed.  -appreciate orthopedic management -F/u with Dr. Roda Shutters 2 weeks post-op for staple removal  -enoxaparin 70mg  BID bridging to warfarin -warfarin per pharmacy -heart healthy diet -PT/OT -oxy 10mg  q4hrs prn -senna, mirilax  S/p mechanical mitral valve replacement On chronic warfarin therapy Performed in [place] in 2001. Recent ECHO on 04/2022 with stable MVR with mean gradient 3. On warfarin for anticoagulation, follows [name]. INR 1.3 this AM after full reversal, will likely take longer to get to therapeutic goal of 2.5-3.5. Per [name] will continue warfarin 7.5 mg daily until Monday. Once patient at SNF can have INR transmitted to [name] for management, although he expects patient to be quite warfarin resistant given full reversal with vitamin K -enoxaparin 70mg  BID bridging to warfarin -warfarin per pharmacy -appreciate pharmacy monitoring and management -trend PT/INR, goal INR 2.5-3.5  Leukocytosis-resolved WBC from 17.7 to 15.6. No evidence of infection and remains HDS and afebrile. Will ocntinue to monitor. -daily cbc  HTN CAD Chronic combined systolic and diastolic HF Hypokalemia ECHO in 04/2022 with EF 35-40%, severe RV dysfunction, stable MVR, severe TVR. Follows [facility] HF team (Dr. Gala Romney) in the outpatient setting.  She states she is not taking spironolactone at home.BP soft, will hold losartan. Will monitor and replete potassium as needed. -hold losartan 12.5mg  daily -Continue farxiga 10mg  daily -continue toprol-xl 25mg  daily -continue torsemide 40mg  daily -trend I/O's   Vitamin D deficiency Osteopenia Patient with  prior history of osteopenia on DEXA and vitamin D deficiency to 5 this admission. Will replete vitamin D, recheck in the outpatient setting, repeat DEXA with potential bisphosphonates of showing osteoporosis. -vitamin D 50K units once weekly  COPD Tobacco use  disorder -supplemental O2 prn, maintain O2 saturations >88% and wean off O2 as able -breo ellipta + incruse ellipta (in place of home trelegy)  -home albuterol nebs q6 prn -nicotine patch -continue counseling on smoking cessation   Paroxysmal A-flutter -holding amiodarone for now -continue toprol-xl 25mg  daily   CKD IIIA Baseline Cr appears to be around 1-1.2 with GFRs in the 50s. Creatinine remains stable and at baseline. -daily bmp -monitor UOP   HLD Lipid panel on 02/2022 with LDL 44.  -resumed home zocor 20mg  daily  Prior to Admission Living Arrangement: Anticipated Discharge Location: Barriers to Discharge: Dispo: Anticipated discharge in approximately >2 days day(s).   Willette Cluster, MD 09/27/2022, 10:50 AM Pager: Willette Cluster, MD Internal Medicine Resident, PGY-1 [facility]  Pager: [phone] After 5pm on weekdays and 1pm on weekends: On Call pager [phone]

## 2022-09-27 NOTE — TOC Progression Note (Signed)
Transition of Care [facility]) - Progression Note    Patient Details  Name: Kim Anthony [name]: [id number] Date of Birth: 02-02-1951  Transition of Care [facility]) CM/SW Contact  Jimmy Picket, [place] Phone Number: 09/27/2022, 12:32 PM  Clinical Narrative:     Pts daughter called CSW, [name] stated she was able to tour [facility] and she would like for her to there for SNF.  Expected Discharge Plan: Skilled Nursing Facility Barriers to Discharge: SNF Pending bed offer  Expected Discharge Plan and Services In-house Referral: Clinical Social Work   Post Acute Care Choice: Skilled Nursing Facility Living arrangements for the past 2 months: Single Family Home                                       Social Determinants of Health (SDOH) Interventions SDOH Screenings   Food Insecurity: No Food Insecurity (09/21/2022)  Housing: Low Risk  (09/21/2022)  Transportation Needs: No Transportation Needs (09/21/2022)  Utilities: Not At Risk (09/21/2022)  Recent Concern: Utilities - At Risk (07/03/2022)  Depression (PHQ2-9): Low Risk  (07/02/2022)  Financial Resource Strain: Medium Risk (02/27/2021)  Social Connections: Socially Isolated (11/28/2019)  Tobacco Use: High Risk (09/25/2022)    Readmission Risk Interventions    02/19/2022    3:36 PM  Readmission Risk Prevention Plan  Transportation Screening Complete  PCP or Specialist Appt within 3-5 Days Complete  HRI or Home Care Consult Complete  Social Work Consult for Recovery Care Planning/Counseling Complete  Palliative Care Screening Not Applicable  Medication Review Oceanographer) Complete

## 2022-09-28 DIAGNOSIS — S72001A Fracture of unspecified part of neck of right femur, initial encounter for closed fracture: Secondary | ICD-10-CM | POA: Diagnosis not present

## 2022-09-28 LAB — PROTIME-INR
INR: 1.4 — ABNORMAL HIGH (ref 0.8–1.2)
Prothrombin Time: 17.3 seconds — ABNORMAL HIGH (ref 11.4–15.2)

## 2022-09-28 NOTE — Progress Notes (Addendum)
Subjective:   No shortness of breath overnight.  Denies chest pain, cough, worsening pain of her right hip.  No other acute concerns.  Discussed plan to continue working with PT, ambulatory pulse ox, and pending SNF placement.  Objective:  Vital signs in last 24 hours: Vitals:   09/27/22 0900 09/27/22 1000 09/27/22 1931 09/28/22 0337  BP:  104/60 (!) 96/55 (!) 92/57  Pulse:  79 83 87  Resp:   18 18  Temp:   99.2 F (37.3 C) 99.2 F (37.3 C)  TempSrc:   Oral Oral  SpO2: 96%  96% 96%  Weight:      Height:       Physical Exam: Gen: no acute distress, resting in bed CV:RRR, no m/r/g, audible mechanical mitral valve, 2+ bilateral radial pulses Pulm: normal wob on RA, no crackles or wheezing Abdomen: soft, non-tender, hypoactive bowel sounds MSK: Post op wound dressings in place on R hip Extr: warm, dry, no LE edema  Assessment/Plan:  Active Problems:   Chronic combined systolic and diastolic heart failure   HLD (hyperlipidemia)   Essential hypertension   Tobacco use disorder   COPD (chronic obstructive pulmonary disease)   S/P MVR (mitral valve replacement)   Chronic anticoagulation   Osteoarthritis of left knee   Atypical atrial flutter   Closed intertrochanteric fracture of right femur, initial encounter   H/O mitral valve replacement with mechanical valve   Chronic HFrEF (heart failure with reduced ejection fraction)   Closed fracture of right hip   History of mitral valve replacement with mechanical valve   Anticoagulated   Alteration in anticoagulation  Kim Anthony is a 72 y/o female w/ COPD, HTN, combined HF, CAD, mechanical MVR on warfarin, pAF, PE/DVT, OA, breast cancer in 2001 s/p chemo/rad/left mastectomy, and tobacco use disorder that presented after a fall and was admitted for management of R intertrochanteric fracture.   #R intertochanteric fracture 2/2 likely mechanical fall S/p R intramedullary nail placement on 4/3 by ortho. Remains HDS. Hgb stable.  No evidence of infection. Pain control w/ oxycodone 10mg  q4 prn. Pending SNF -F/u with Dr. Roda Shutters 2 weeks post-op for staple removal  -Appreciate ortho recs -Appreciate PT/OT recs -Oxycodone 10mg  q4hrs PRN -Senna, mirilax   #S/p mechanical mitral valve replacement #Chronic warfarin therapy MVR in 2001. Echo from 04/2022 w/ stable MVR with mean gradient 3. Currently on warfarin w/ lovenox bridge. Following w/ [name]. INR 1.4 today w/ therapeutic goal 2.5-3.5. INR must be communicated to [name] for management once transferred to SNF. Suspects patient will be warfarin resistant given full reversal with vitamin K.  -Trend PT/INR, goal INR 2.5-3.5 -Warfarin per pharmacy -Lovenox 70 mg BID   #HTN #CAD #Chronic combined systolic and diastolic HF #Hypokalemia Echo from 04/2022 w/ EF 35-40%, severe RV dysfunction, stable MVR, severe TVR. Follows w/ [facility] HF team (Dr. Gala Romney) as outpatient. Reports not taking spironolactone at home. Holding losartan for soft BP.    -Hold losartan 12.5mg  daily -Continue farxiga 10mg  daily -Continue toprol-xl 25mg  daily -Continue torsemide 40mg  daily -Strict Is/Os   #Vitamin D deficiency #Osteoporosis  Prior osteopenia on DEXA w/ vitamin deficiency to 5 this admission. Given acute fracture, has developed osteoporosis. Repleting vitamin D. Plan to discuss repeat DEXA and treatment outpatient.  -Vitamin D 50K units once weekly   #COPD #Tobacco use disorder Taking trelegy at home. Not on oxygen at home. Satting well on RA this morning. Will perform ambulatory pulse ox to assess oxygen requirements.  -  Supplemental O2 as necessary, wean as able -Maintain O2 sat 88-92%  -Breo ellipta + incruse ellipta  -Home albuterol nebs q6 prn -Nicotine patch  #Leukocytosis  Leukocytosis remains improved, remains afebrile and HDS. -Trend CBC   #Paroxysmal A-flutter Holding home amiodarone for now.  -Continue toprol-xl 25mg  daily   #CKD IIIA Baseline Cr 1-1.2. Cr  remains stable.  -Trend BMP   #HLD Lipid panel from 02/2022 with LDL 44.  -Resumed home zocor 20 mg daily  Diet: HH Bowel: senna, miralax VTE: warfarin IVF: none Code: full PT/OT recs: HH PT/OT   Prior to Admission Living Arrangement:  Anticipated Discharge Location: TBD Barriers to Discharge: pending SNF placement Dispo: Anticipated discharge in approximately less than 2 day(s).   Karoline Caldwell, MD 09/28/2022, 6:20 AM Pager: [phone] After 5pm on weekdays and 1pm on weekends: On Call pager [phone]

## 2022-09-28 NOTE — Consult Note (Signed)
   [facility] Inpatient Consult   09/28/2022  [name] 1950/11/02 [id number]  [facility] [[facility]]  Accountable Care Organization [ACO] Patient: [employer] Medicare  Primary Care Provider:  Willette Cluster, MD with [facility] Internal Medicine which is listed to provide the transition of care follow up   Patient is currently active with [facility] [[facility]] Care Management for chronic disease management services.  Patient has been engaged by a [facility] RN CC.  Our community based plan of care has focused on disease management and community resource support.   Met with patient at bedside resting in bed, patient endorses the need for "some rehab to getting stronger."    Plan: Patient is currently for a skilled nursing facility level of care.  Will update [facility] RNCC of disposition plans when patient transitions.  If patient goes to a [facility] affiliated facility will make aware to [facility] RN for following.   Of note, [facility] Care Management services does not replace or interfere with any services that are needed or arranged by inpatient [facility] care management team.   For additional questions or referrals please contact:  Charlesetta Shanks, RN BSN CCM [facility]  [phone] business mobile phone Toll free office [phone]  *Concierge Line  [phone] Fax number: [phone] [email or web address]

## 2022-09-28 NOTE — Progress Notes (Signed)
Physical Therapy Treatment Patient Details Name: Kim Anthony [name]: [id number] DOB: [birth year] Today's Date: 09/28/2022   History of Present Illness 72 y.o. female presenting 09/21/2022 with R hip pain and swelling after mechanical fall on her porch. Pt found to have right hip IT fracture. Now s/p intertrochanteric intramedullary nailing 4/3. PMH significant for COPD, HTN, CHF, mechanical MVR on warfarin, pAF, PE/DVT, breast cancer.    PT Comments    Received pt semi-reclined in bed and pt agreeable to PT treatment, but severely limited by pain. Pt required mod A to roll, max A for supine<>sit, and mod A for sit<>supine with HOB elevated and use of bed features. Pt required assist to management RLE as well as for trunk control, in addition to cues for logroll technique. Upon sitting EOB, pt reported increased "soreness" and politely declined any standing - therefore performed seated LE exercises (see below). Pt's daughter called and decided on which SNF they would like to pursue. Pt will benefit from continued PT services to address functional impairments. Acute PT to cont to follow.    Recommendations for follow up therapy are one component of a multi-disciplinary discharge planning process, led by the attending physician.  Recommendations may be updated based on patient status, additional functional criteria and insurance authorization.  Follow Up Recommendations  Can patient physically be transported by private vehicle: No    Assistance Recommended at Discharge Frequent or constant Supervision/Assistance  Patient can return home with the following A lot of help with bathing/dressing/bathroom;Assistance with cooking/housework;Direct supervision/assist for medications management;Direct supervision/assist for financial management;Assist for transportation;Help with stairs or ramp for entrance;Two people to help with walking and/or transfers   Equipment Recommendations  None recommended by PT (TBD  in next venue)    Recommendations for Other Services       Precautions / Restrictions Precautions Precautions: Fall Precaution Comments: premedicate Restrictions Weight Bearing Restrictions: Yes RLE Weight Bearing: Weight bearing as tolerated     Mobility  Bed Mobility Overal bed mobility: Needs Assistance Bed Mobility: Rolling, Supine to Sit, Sit to Supine Rolling: Mod assist   Supine to sit: Max assist, HOB elevated Sit to supine: Mod assist   General bed mobility comments: Mod A for RLE advancement and scooting hips to EOB. Max A for trunk elevation to EOB. Pt politely declined any standing due to "soreness" Patient Response: Cooperative  Transfers                        Ambulation/Gait                   Stairs             Wheelchair Mobility    Modified Rankin (Stroke Patients Only)       Balance Overall balance assessment: Needs assistance, History of Falls Sitting-balance support: Feet supported, Bilateral upper extremity supported Sitting balance-Leahy Scale: Fair Sitting balance - Comments: able to maintain static sitting balance with min guard fading to close supervision       Standing balance comment: pt politely declined standing                            Cognition Arousal/Alertness: Awake/alert Behavior During Therapy: WFL for tasks assessed/performed Overall Cognitive Status: Within Functional Limits for tasks assessed  General Comments: pt required step by step cues for logroll technique with bed mobility.        Exercises General Exercises - Lower Extremity Ankle Circles/Pumps: AROM, Both, 5 reps Long Arc Quad: AROM, Both, 10 reps Hip Flexion/Marching: AROM, Both, 5 reps    General Comments General comments (skin integrity, edema, etc.): Pt on 1L O2 with SPO2 >94% with mobility. Pt limited by pain, soreness, and "stiffness"      Pertinent Vitals/Pain  Pain Assessment Pain Assessment: 0-10 Pain Score: 8  Pain Location: R hip with movement Pain Descriptors / Indicators: Guarding, Grimacing, Aching, Operative site guarding, Discomfort, Sore Pain Intervention(s): Limited activity within patient's tolerance, Monitored during session, Premedicated before session, Repositioned    Home Living                          Prior Function            PT Goals (current goals can now be found in the care plan section) Acute Rehab PT Goals Patient Stated Goal: to get home and walk PT Goal Formulation: With patient/family Time For Goal Achievement: 10/08/22 Potential to Achieve Goals: Good Progress towards PT goals: Progressing toward goals    Frequency    Min 3X/week      PT Plan Current plan remains appropriate    Co-evaluation              AM-PAC PT "6 Clicks" Mobility   Outcome Measure  Help needed turning from your back to your side while in a flat bed without using bedrails?: A Lot Help needed moving from lying on your back to sitting on the side of a flat bed without using bedrails?: A Lot Help needed moving to and from a bed to a chair (including a wheelchair)?: Total Help needed standing up from a chair using your arms (e.g., wheelchair or bedside chair)?: A Lot Help needed to walk in hospital room?: Total Help needed climbing 3-5 steps with a railing? : Total 6 Click Score: 9    End of Session   Activity Tolerance: Patient limited by pain Patient left: in bed;with call bell/phone within reach;with bed alarm set Nurse Communication: Mobility status PT Visit Diagnosis: Unsteadiness on feet (R26.81);Muscle weakness (generalized) (M62.81);Pain;Difficulty in walking, not elsewhere classified (R26.2);History of falling (Z91.81) Pain - Right/Left: Right Pain - part of body: Hip     Time: 0355-9741 PT Time Calculation (min) (ACUTE ONLY): 21 min  Charges:  $Therapeutic Activity: 8-22 mins                      Raechel Chute PT, DPT  Alfonso Patten 09/28/2022, 9:10 AM

## 2022-09-28 NOTE — Progress Notes (Signed)
Patient ordered Ambulatory Pulse ox test, attempted patient states she feels weak and needs a nap. Patient is unsteady on her feet and requires 2x assist. MD made aware and states if unsafe will wait until PT works with patient tomorrow to complete.

## 2022-09-28 NOTE — Progress Notes (Signed)
ANTICOAGULATION CONSULT NOTE - Follow Up Consult  Pharmacy Consult for Warfarin and Enoxaparin Indication:  mechanical MVR, atrial fibrillation and hx PE/DVT  Allergies  Allergen Reactions   Lisinopril Swelling   Acetaminophen-Codeine Itching   Propoxyphene Itching    Darvocet   Tape Rash    Plastic   Tramadol Itching, Nausea And Vomiting and Rash    Patient Measurements: Height: 5\' 5"  (165.1 cm) Weight: 67.3 kg (148 lb 5.9 oz) IBW/kg (Calculated) : 57 Enoxaparin Dosing Weight: 67.3 kg  Vital Signs: Temp: 98.6 F (37 C) (04/08 0729) Temp Source: Oral (04/08 0729) BP: 98/55 (04/08 1000) Pulse Rate: 80 (04/08 1000)  Labs: Recent Labs    09/26/22 0227 09/27/22 0341 09/27/22 0716 09/27/22 0847 09/27/22 1206 09/28/22 0307  HGB 9.6*  --  5.5* 9.8*  --   --   HCT 30.0*  --  17.4* 31.0*  --   --   PLT 311  --  420*  --   --   --   LABPROT 13.8 15.9*  --   --   --  17.3*  INR 1.1 1.3*  --   --   --  1.4*  CREATININE 0.95  --   --   --  1.02*  --      Estimated Creatinine Clearance: 44.9 mL/min (A) (by C-G formula based on SCr of 1.02 mg/dL (H)).  Assessment: 37 YOF presenting 09/21/22 with R hip fracture 2/2 fall on warfarin PTA with INR on admission 3.3. Pt has a history of PE/DVT, mechanical MVR, and Afib and was taking warfarin 5 mg po daily prior to admission. Last dose of warfarin was taken on 09/20/22 at 17:30. Warfarin held for surgery and Vitamin K given. Pharmacy consulted to initiate heparin bridging once INR < 2.5.  Heparin held for OR 4/3.  Warfarin resumed post-op 4/3 pm, s/p R IM nai.  Heparin changed to full-dose Enoxaparin on 4/4 pm due to IV issues. INR 1.4 after Warfarin 7.5 mg x 5 days.  S/p total 15 mg IV Vitamin K on 4/2 and may take some time for INR to trend back up. Hgb 9.8 on 4/7, consistent with prior values. Goal INR is 2.5-3.5 . *Noted plan for [name], [name] to follow-up warfarin dosing as outpatient, as previously managing warfarin dosing at  [facility].   PTA Warfarin regimen: 5 mg daily.  Goal of Therapy:  INR 2.5-3.5 Anti-Xa level 0.6-1 units/ml 4hrs after LMWH dose given Monitor platelets by anticoagulation protocol: Yes   Plan:  Warfarin 7.5 mg daily for now.  Continue Enoxaparin 70 mg SQ q12h until INR at goal Daily PT/INR. Intermittent CBC. Outpatient follow up with [name], PharmD   Thank you for allowing pharmacy to be part of this patients care team.  Noah Delaine, RPh Clinical Pharmacist 09/28/2022,10:52 AM Please check AMION for all [facility] Pharmacy phone numbers After 10:00 PM, call Main Pharmacy [phone]

## 2022-09-28 NOTE — TOC Progression Note (Addendum)
Transition of Care [facility]) - Progression Note    Patient Details  Name: Kim Anthony [name]: [id number] Date of Birth: Oct 08, 1950  [facility] CM/SW Contact  Lorri Frederick, LCSW Phone Number: 09/28/2022, 9:47 AM  Clinical Narrative:   CSW received message from [place].  No beds available until tomorrow.  Pt also pending auth and needs new PT note.  PT notified.   1100: Message from [employer].  Bed is now available today.  PT note is in, [place] request submitted in [place].    Expected Discharge Plan: Skilled Nursing Facility Barriers to Discharge: SNF Pending bed offer  Expected Discharge Plan and Services In-house Referral: Clinical Social Work   Post Acute Care Choice: Skilled Nursing Facility Living arrangements for the past 2 months: Single Family Home                                       Social Determinants of Health (SDOH) Interventions SDOH Screenings   Food Insecurity: No Food Insecurity (09/21/2022)  Housing: Low Risk  (09/21/2022)  Transportation Needs: No Transportation Needs (09/21/2022)  Utilities: Not At Risk (09/21/2022)  Recent Concern: Utilities - At Risk (07/03/2022)  Depression (PHQ2-9): Low Risk  (07/02/2022)  Financial Resource Strain: Medium Risk (02/27/2021)  Social Connections: Socially Isolated (11/28/2019)  Tobacco Use: High Risk (09/25/2022)    Readmission Risk Interventions    02/19/2022    3:36 PM  Readmission Risk Prevention Plan  Transportation Screening Complete  PCP or Specialist Appt within 3-5 Days Complete  HRI or Home Care Consult Complete  Social Work Consult for Recovery Care Planning/Counseling Complete  Palliative Care Screening Not Applicable  Medication Review Oceanographer) Complete

## 2022-09-29 ENCOUNTER — Encounter: Payer: 59 | Admitting: Internal Medicine

## 2022-09-29 DIAGNOSIS — S72001A Fracture of unspecified part of neck of right femur, initial encounter for closed fracture: Secondary | ICD-10-CM | POA: Diagnosis not present

## 2022-09-29 DIAGNOSIS — F17210 Nicotine dependence, cigarettes, uncomplicated: Secondary | ICD-10-CM | POA: Diagnosis not present

## 2022-09-29 DIAGNOSIS — I5043 Acute on chronic combined systolic (congestive) and diastolic (congestive) heart failure: Secondary | ICD-10-CM

## 2022-09-29 DIAGNOSIS — Z7901 Long term (current) use of anticoagulants: Secondary | ICD-10-CM | POA: Diagnosis not present

## 2022-09-29 LAB — CBC
HCT: 31.9 % — ABNORMAL LOW (ref 36.0–46.0)
Hemoglobin: 10.2 g/dL — ABNORMAL LOW (ref 12.0–15.0)
MCH: 32.7 pg (ref 26.0–34.0)
MCHC: 32 g/dL (ref 30.0–36.0)
MCV: 102.2 fL — ABNORMAL HIGH (ref 80.0–100.0)
Platelets: 389 10*3/uL (ref 150–400)
RBC: 3.12 MIL/uL — ABNORMAL LOW (ref 3.87–5.11)
RDW: 14.1 % (ref 11.5–15.5)
WBC: 10.8 10*3/uL — ABNORMAL HIGH (ref 4.0–10.5)
nRBC: 0 % (ref 0.0–0.2)

## 2022-09-29 LAB — PROTIME-INR
INR: 1.3 — ABNORMAL HIGH (ref 0.8–1.2)
Prothrombin Time: 16.1 seconds — ABNORMAL HIGH (ref 11.4–15.2)

## 2022-09-29 MED ORDER — LOSARTAN POTASSIUM 25 MG PO TABS: 12.5000 mg | ORAL_TABLET | Freq: Every day | ORAL | 2 refills | Status: DC

## 2022-09-29 MED ORDER — HYDROCODONE-ACETAMINOPHEN 5-325 MG PO TABS: 1.0000 | ORAL_TABLET | ORAL | 0 refills | Status: DC | PRN

## 2022-09-29 MED ORDER — WARFARIN SODIUM 7.5 MG PO TABS: 7.5000 mg | ORAL_TABLET | Freq: Every day | ORAL | Status: DC

## 2022-09-29 MED ORDER — VITAMIN D (ERGOCALCIFEROL) 1.25 MG (50000 UNIT) PO CAPS: 50000.0000 [IU] | ORAL_CAPSULE | ORAL | 0 refills | Status: DC

## 2022-09-29 MED ORDER — OXYCODONE HCL 5 MG PO TABS: 5.0000 mg | ORAL_TABLET | ORAL | 0 refills | Status: AC | PRN

## 2022-09-29 MED ORDER — ENOXAPARIN SODIUM 80 MG/0.8ML IJ SOSY: 70.0000 mg | PREFILLED_SYRINGE | Freq: Two times a day (BID) | INTRAMUSCULAR | Status: DC

## 2022-09-29 NOTE — Progress Notes (Signed)
Occupational Therapy Treatment Patient Details Name: Kim Anthony [name]: [id number] DOB: 1951-02-10 Today's Date: 09/29/2022   History of present illness 72 y.o. female presenting 09/21/2022 with R hip pain and swelling after mechanical fall on her porch. Pt found to have right hip IT fracture. Now s/p intertrochanteric intramedullary nailing 4/3. PMH significant for COPD, HTN, CHF, mechanical MVR on warfarin, pAF, PE/DVT, breast cancer.   OT comments  Pt making gradual progression towards OT goals. Pain remains a limiting factor despite premedication. However, pt able to demonstrate transfers w/ RW with no more than Min A x 2 w/ increased time needed to complete tasks. Discussed BSC use rather than purewick when up in chair as pt endorses urinary continence. Continue to rec continued post acute rehab w/ pt motivated to improve.  SpO2 95-96% on RA throughout session.    Recommendations for follow up therapy are one component of a multi-disciplinary discharge planning process, led by the attending physician.  Recommendations may be updated based on patient status, additional functional criteria and insurance authorization.    Assistance Recommended at Discharge Frequent or constant Supervision/Assistance  Patient can return home with the following  A lot of help with walking and/or transfers;A lot of help with bathing/dressing/bathroom;Assistance with cooking/housework;Assist for transportation;Help with stairs or ramp for entrance   Equipment Recommendations  Other (comment);BSC/3in1 (RW)    Recommendations for Other Services      Precautions / Restrictions Precautions Precautions: Fall Restrictions Weight Bearing Restrictions: Yes RLE Weight Bearing: Weight bearing as tolerated       Mobility Bed Mobility Overal bed mobility: Needs Assistance Bed Mobility: Supine to Sit     Supine to sit: Mod assist, HOB elevated     General bed mobility comments: able to assist w/ RLE to EOB w/  assist to lift trunk and scoot hips forward    Transfers Overall transfer level: Needs assistance Equipment used: Rolling walker (2 wheels) Transfers: Bed to chair/wheelchair/BSC, Sit to/from Stand Sit to Stand: Min assist, +2 safety/equipment     Step pivot transfers: Min assist, +2 physical assistance, +2 safety/equipment     General transfer comment: Min A to stand at bedside w/ RW (+2 present for safety). MIn A x 2 for safe pivot to recliner w/ cues for LE sequencing and DME mgmt.     Balance Overall balance assessment: Needs assistance, History of Falls Sitting-balance support: Feet supported, Bilateral upper extremity supported Sitting balance-Leahy Scale: Fair     Standing balance support: Bilateral upper extremity supported, During functional activity Standing balance-Leahy Scale: Poor                             ADL either performed or assessed with clinical judgement   ADL Overall ADL's : Needs assistance/impaired Eating/Feeding: Modified independent;Sitting Eating/Feeding Details (indicate cue type and reason): EOB and in recliner w/ coffee                                   General ADL Comments: Focus on OOB transfers, O2 sat monitoring per MD and RN request    Extremity/Trunk Assessment Upper Extremity Assessment Upper Extremity Assessment: Generalized weakness   Lower Extremity Assessment Lower Extremity Assessment: Defer to PT evaluation        Vision   Vision Assessment?: No apparent visual deficits   Perception     Praxis  Cognition Arousal/Alertness: Awake/alert Behavior During Therapy: WFL for tasks assessed/performed Overall Cognitive Status: Within Functional Limits for tasks assessed Area of Impairment: Safety/judgement, Problem solving                         Safety/Judgement: Decreased awareness of safety   Problem Solving: Requires verbal cues General Comments: pleasant, participatory and  follows one step directions consistently. sometimes limited by anxiety vs pain anticipation        Exercises      Shoulder Instructions       General Comments      Pertinent Vitals/ Pain       Pain Assessment Pain Assessment: Faces Faces Pain Scale: Hurts little more Pain Location: R hip with movement Pain Descriptors / Indicators: Guarding, Grimacing, Aching, Discomfort Pain Intervention(s): Monitored during session  Home Living                                          Prior Functioning/Environment              Frequency  Min 2X/week        Progress Toward Goals  OT Goals(current goals can now be found in the care plan section)  Progress towards OT goals: Progressing toward goals  Acute Rehab OT Goals Patient Stated Goal: pain control, not have any more falls OT Goal Formulation: With patient Time For Goal Achievement: 10/08/22 Potential to Achieve Goals: Good ADL Goals Pt Will Perform Lower Body Dressing: with min guard assist;sit to/from stand Pt Will Transfer to Toilet: with min guard assist;ambulating;bedside commode  Plan Discharge plan remains appropriate    Co-evaluation                 AM-PAC OT "6 Clicks" Daily Activity     Outcome Measure   Help from another person eating meals?: None Help from another person taking care of personal grooming?: A Little Help from another person toileting, which includes using toliet, bedpan, or urinal?: A Lot Help from another person bathing (including washing, rinsing, drying)?: A Lot Help from another person to put on and taking off regular upper body clothing?: A Little Help from another person to put on and taking off regular lower body clothing?: A Lot 6 Click Score: 16    End of Session Equipment Utilized During Treatment: Rolling walker (2 wheels);Gait belt  OT Visit Diagnosis: Unsteadiness on feet (R26.81);Other abnormalities of gait and mobility (R26.89)   Activity  Tolerance Patient tolerated treatment well   Patient Left in chair;with call bell/phone within reach;with chair alarm set   Nurse Communication Mobility status;Other (comment) (O2)        Time: [id number] OT Time Calculation (min): 22 min  Charges: OT General Charges $OT Visit: 1 Visit OT Treatments $Therapeutic Activity: 8-22 mins  Bradd CanaryJulie B, OTR/L Acute Rehab Services Office: [phone]   Lorre MunroeJulie  Jameon Deller 09/29/2022, 11:35 AM

## 2022-09-29 NOTE — Plan of Care (Signed)
  Problem: Education: Goal: Ability to demonstrate management of disease process will improve 09/29/2022 1844 by Pershing Proud, RN Outcome: Adequate for Discharge 09/29/2022 1844 by Pershing Proud, RN Outcome: Adequate for Discharge Goal: Ability to verbalize understanding of medication therapies will improve Outcome: Adequate for Discharge Goal: Individualized Educational Video(s) Outcome: Adequate for Discharge   Problem: Activity: Goal: Capacity to carry out activities will improve Outcome: Adequate for Discharge   Problem: Cardiac: Goal: Ability to achieve and maintain adequate cardiopulmonary perfusion will improve Outcome: Adequate for Discharge   Problem: Education: Goal: Knowledge of disease or condition will improve Outcome: Adequate for Discharge Goal: Knowledge of the prescribed therapeutic regimen will improve Outcome: Adequate for Discharge Goal: Individualized Educational Video(s) Outcome: Adequate for Discharge   Problem: Activity: Goal: Ability to tolerate increased activity will improve Outcome: Adequate for Discharge Goal: Will verbalize the importance of balancing activity with adequate rest periods Outcome: Adequate for Discharge   Problem: Respiratory: Goal: Ability to maintain a clear airway will improve Outcome: Adequate for Discharge Goal: Levels of oxygenation will improve Outcome: Adequate for Discharge Goal: Ability to maintain adequate ventilation will improve Outcome: Adequate for Discharge   Problem: Education: Goal: Knowledge of General Education information will improve Description: Including pain rating scale, medication(s)/side effects and non-pharmacologic comfort measures Outcome: Adequate for Discharge   Problem: Health Behavior/Discharge Planning: Goal: Ability to manage health-related needs will improve Outcome: Adequate for Discharge   Problem: Clinical Measurements: Goal: Ability to maintain clinical measurements within  normal limits will improve Outcome: Adequate for Discharge Goal: Will remain free from infection Outcome: Adequate for Discharge Goal: Diagnostic test results will improve Outcome: Adequate for Discharge Goal: Respiratory complications will improve Outcome: Adequate for Discharge Goal: Cardiovascular complication will be avoided Outcome: Adequate for Discharge   Problem: Activity: Goal: Risk for activity intolerance will decrease Outcome: Adequate for Discharge   Problem: Nutrition: Goal: Adequate nutrition will be maintained Outcome: Adequate for Discharge   Problem: Coping: Goal: Level of anxiety will decrease Outcome: Adequate for Discharge   Problem: Elimination: Goal: Will not experience complications related to bowel motility Outcome: Adequate for Discharge Goal: Will not experience complications related to urinary retention Outcome: Adequate for Discharge   Problem: Pain Managment: Goal: General experience of comfort will improve Outcome: Adequate for Discharge   Problem: Safety: Goal: Ability to remain free from injury will improve Outcome: Adequate for Discharge   Problem: Skin Integrity: Goal: Risk for impaired skin integrity will decrease Outcome: Adequate for Discharge   Problem: Education: Goal: Verbalization of understanding the information provided (i.e., activity precautions, restrictions, etc) will improve Outcome: Adequate for Discharge Goal: Individualized Educational Video(s) Outcome: Adequate for Discharge   Problem: Activity: Goal: Ability to ambulate and perform ADLs will improve Outcome: Adequate for Discharge   Problem: Clinical Measurements: Goal: Postoperative complications will be avoided or minimized Outcome: Adequate for Discharge   Problem: Self-Concept: Goal: Ability to maintain and perform role responsibilities to the fullest extent possible will improve Outcome: Adequate for Discharge   Problem: Pain Management: Goal: Pain  level will decrease Outcome: Adequate for Discharge

## 2022-09-29 NOTE — Discharge Summary (Addendum)
Name: Kim KanskyBetty B Anthony [name]: [id number] DOB: 06/02/1951 72 y.o. PCP: Willette Clusterogers, Tyler, MD  Date of Admission: 09/21/2022  4:04 AM Date of Discharge: 09/29/2022 Attending Physician: Dickie LaLau, Grace, MD  Discharge Diagnosis: 1. Active Problems:   Chronic combined systolic and diastolic heart failure   HLD (hyperlipidemia)   Essential hypertension   Tobacco use disorder   COPD (chronic obstructive pulmonary disease)   S/P MVR (mitral valve replacement)   Chronic anticoagulation   Osteoarthritis of left knee   Atypical atrial flutter   Closed intertrochanteric fracture of right femur, initial encounter   H/O mitral valve replacement with mechanical valve   Chronic HFrEF (heart failure with reduced ejection fraction)   Closed fracture of right hip   History of mitral valve replacement with mechanical valve   Anticoagulated   Alteration in anticoagulation   CAD   Vitamin D deficiency   Osteoporosis   CKD3A  Discharge Medications: Allergies as of 09/29/2022       Reactions   Lisinopril Swelling   Acetaminophen-codeine Itching   Propoxyphene Itching   Darvocet   Tape Rash   Plastic   Tramadol Itching, Nausea And Vomiting, Rash        Medication List     STOP taking these medications    amiodarone 200 MG tablet Commonly known as: PACERONE   famotidine 20 MG tablet Commonly known as: PEPCID   spironolactone 25 MG tablet Commonly known as: ALDACTONE   varenicline 0.5 MG tablet Commonly known as: Chantix       TAKE these medications    albuterol 108 (90 Base) MCG/ACT inhaler Commonly known as: VENTOLIN HFA Inhale 2 puffs into the lungs every 6 (six) hours as needed for wheezing or shortness of breath.   BreatheRite Coll Spacer Adult Misc 1 applicator by Does not apply route daily.   diphenhydrAMINE 25 MG tablet Commonly known as: BENADRYL Take 50-75 mg by mouth daily as needed for allergies.   DULoxetine 20 MG capsule Commonly known as: CYMBALTA Take 20 mg by  mouth at bedtime.   enoxaparin 80 MG/0.8ML injection Commonly known as: LOVENOX Inject 0.7 mLs (70 mg total) into the skin 2 (two) times daily.   Farxiga 10 MG Tabs tablet Generic drug: dapagliflozin propanediol Take 1 tablet by mouth once daily   fluticasone 50 MCG/ACT nasal spray Commonly known as: Flonase Place 1 spray into both nostrils daily as needed for allergies. What changed:  how much to take when to take this   gabapentin 600 MG tablet Commonly known as: NEURONTIN TAKE 1 & 1/2 (ONE & ONE-HALF) TABLETS BY MOUTH THREE TIMES DAILY What changed:  how much to take how to take this when to take this additional instructions   HYDROcodone-acetaminophen 5-325 MG tablet Commonly known as: Norco Take 1 tablet by mouth every 4 (four) hours as needed for moderate pain. Start taking on: October 06, 2022 What changed: These instructions start on October 06, 2022. If you are unsure what to do until then, ask your doctor or other care provider.   losartan 25 MG tablet Commonly known as: COZAAR Take 0.5 tablets (12.5 mg total) by mouth daily.   metoprolol succinate 25 MG 24 hr tablet Commonly known as: TOPROL-XL Take 1 tablet (25 mg total) by mouth daily.   oxyCODONE 5 MG immediate release tablet Commonly known as: Roxicodone Take 1 tablet (5 mg total) by mouth every 4 (four) hours as needed for up to 7 days for severe pain.   pantoprazole  40 MG tablet Commonly known as: Protonix Take 1 tablet (40 mg total) by mouth daily.   potassium chloride SA 20 MEQ tablet Commonly known as: KLOR-CON M Take 2 tablets (40 mEq total) by mouth 2 (two) times daily. What changed: how much to take   simvastatin 20 MG tablet Commonly known as: ZOCOR Take 1 tablet (20 mg total) by mouth daily.   torsemide 20 MG tablet Commonly known as: DEMADEX Take 2 tablets (40 mg total) by mouth daily.   Trelegy Ellipta 100-62.5-25 MCG/ACT Aepb Generic drug: Fluticasone-Umeclidin-Vilant Inhale 1  Inhalation into the lungs daily.   Vitamin D (Ergocalciferol) 1.25 MG (50000 UNIT) Caps capsule Commonly known as: DRISDOL Take 1 capsule (50,000 Units total) by mouth every 7 (seven) days. Start taking on: October 05, 2022   warfarin 7.5 MG tablet Commonly known as: COUMADIN Take as directed. If you are unsure how to take this medication, talk to your nurse or doctor. Original instructions: Take 1 tablet (7.5 mg total) by mouth daily at 4 PM. What changed:  medication strength how much to take        Disposition and follow-up:   [name] was discharged from [facility] in Good condition.  At the hospital follow up visit please address:  1.    A. R intertochanteric fracture 2/2 likely mechanical fall   - Discharged on oxycodone for pain control, plan to transition back to home hydrocodone-acetaminophen 5-325 mg in clinic, will f/u w/ ortho Dr. Roda Shutters 2 weeks post-op for staple removal   B. Chronic warfarin therapy   - Discharged on warfarin and lovenox, therapeutic INR goal 2.5-3.5, followed by [name], INR must be communicated to [name] while at [facility] in order to adjust her dose of warfarin/lovenox  Please check PT/INR DAILY and call Dr. Hulen Luster (pharmacist at the [facility]) with the results. He manages her INR and will adjust her medications as appropriate. Call [phone] and ask to speak with [name] to manage this. If unable to reach at the above number, you may try [phone] or email at [email or web address].  2.  Labs / imaging needed at time of follow-up: daily PT/INR  3.  Pending labs/ test needing follow-up: none  Follow-up Appointments:  Contact information for follow-up providers     Tarry Kos, MD. Schedule an appointment as soon as possible for a visit in 2 week(s).   Specialty: Orthopedic Surgery Contact information: [address] [place] [address] [phone]              Contact  information for after-discharge care     Destination     [facility], [facility] Preferred SNF .   Service: Skilled Nursing Contact information: [address] [place] [address] [phone]                    - Please follow up with your primary care provider at the [facility] (your SNF will schedule your appointment) Phone: [phone] Address: Ground Floor - [facility], [address] [place], [place], [place] [address]  Hospital Course by problem list:  Chatney Gregory is a 72 y/o female w/ COPD, HTN, combined HF, CAD, mechanical MVR on warfarin, pAF, PE/DVT, OA, breast cancer in 2001 s/p chemo/rad/left mastectomy, and tobacco use disorder that presented after a fall and was admitted for management of R intertrochanteric fracture.   #R intertochanteric fracture 2/2 likely mechanical  fall Presented after a mechanical fall in the middle of the night while smoking cigarettes. No prodromal symptoms prior to fall and no syncope noted. Radiographs with acute R intertrochanteric fracture. It was a ground-level fall so this is a fragility fracture. Orthopedic surgery followed the patient and placed an intramedullary rod. The patients pain was initially controlled on a PCA pump. She was transitioned to oral pain meds during admission.  Discharged on oxycodone with plan to transition back to home hydrocodone-acetaminophen at subsequent clinic visit.  PT/OT evaluated her and recommended SNF where she was discharged.  Follow-up with Ortho as outpatient.  #S/p mechanical mitral valve replacement #Chronic warfarin therapy Performed in [place] in 2001. Recent ECHO on 04/2022 with stable MVR with mean gradient 3. On warfarin for anticoagulation, follows [name]. Her INR was 3.3 on admission (goal 2.5-3.5), pharmacy consulted to manage this.Per orthopedic surgery wanted INR <1.5. Warfarin was fully reversed with  IV vitamin K by  orthopedic surgery. Initially bridged with heparin ggt and then transitioned to lovenox. Per pharmacy will be warfarin resistant for some time given full reversal and will require lovenox bridge during this time. [name] will attempt to manage INR during SNF stay. He instructed to provide his name, phone number, email to SNF for INR routing and communication about warfarin.  Was discharged on heparin and Lovenox.  PT/INR will need to be communicated to [name] for management of her Lovenox/warfarin dosage.  #HTN #CAD #Chronic combined systolic and diastolic HF #Hypokalemia Echo from 04/2022 w/ EF 35-40%, severe RV dysfunction, stable MVR, severe TVR. Follows outpatient w/ [facility] HF team (Dr. Gala Romney). Not taking spironolactone at home despite cardiology note stating to continue.  Held spironolactone at discharge as she was not taking this medication. Held losartan for soft BP initially but continued at discharge. Continued home Farxiga, Toprol, and torsemide throughout her hospitalization and at discharge.  To follow-up with cardiology as outpatient.  #Vitamin D deficiency #Osteoporosis Patient with prior history of osteopenia on DEXA and vitamin D deficiency to 5 this admission. In the setting of ground level fragility fracture has clinical osteoporosis. Began vitamin D 50,000u once weekly during admission. Once vitamin D repleted will need DEXA and discussion of treatment options for osteoporosis in the outpatient setting.   #COPD #Tobacco use disorder Uses trelegy at home. No supplemental oxygen at home. Required supplemental oxygen, however was weaned down to room air.  Satting well on RA.  Treated with Breo Ellipta and Incruse (hospital equivalents) while inpatient.  Given nicotine patch.  Discharged on home Trelegy.  #Leukocytosis Noted in the days following surgery but remained HDS and afebrile. No urinary sxs. No surgical site evidence of infection. No cough or CP to suggest pneumonia.  WBC trended downward appropriately with monitoring.   #Paroxysmal A-flutter Follows cardiology in the outpatient setting. She underwent TEE and DCCV on 02/2022 and converted to NSR.  States that she was taken off of amiodarone. Last office visit note from Dr. Gala Romney on 06/11/2022 states that she was instructed to stop amiodarone due to long Qtc although note also mentions to continue amiodarone  daily. Has been referred to AF clinic to discuss antiarrhythmic drug therapy vs ablation, no visit yet noted on chart review. She is on warfarin for chronic anticoagulation, followed by [name]. Continued troprol-xl during admission.  Held amiodarone at discharge due to prolonged QTc.  Consider restarting in clinic following repeat EKG with normalization of QTc.  #L shoulder pain Patient reported sharp L  shoulder pain that was new without any trauma or known inciting event. Grossly reduced active ROM in the L shoulder, full passive ROM. PTP of the bicep and anterior shoulder. 5/5 strength at elbow joint, shoulder joint strength limited by pain. No deformities or crepitus of the joint. Thought to be biceps tendonitis or rotator cuff tendonitis. No history or evidence of OA on radiograph 06/2018. Improved with monitoring.  Continue to reassess as outpatient.  #CKD IIIA Baseline Cr appears to be around 1-1.2 with GFRs in the 50s. Monitored during admission.  Stable at discharge.   #Hyperglycemia Last A1c 5.3% in 2017. No formal diagnosis of diabetes. CMP with glucose 148 on admission. A1c 5.5%. Likely in the setting of fracture, no follow up needed.   #HLD Lipid panel on 02/2022 with LDL 44. Continued home zocor 20mg  daily throughout her hospitalization and at discharge.   Discharge Exam:   BP (!) 116/98 (BP Location: Right Leg) Comment: RN notified  Pulse 79   Temp 98.7 F (37.1 C) (Oral)   Resp 18   Ht 5\' 5"  (1.651 m)   Wt 67.3 kg   SpO2 93%   BMI 24.69 kg/m  Gen: no acute distress, resting  in bed CV:RRR, no m/r/g, audible mechanical mitral valve, 2+ bilateral radial pulses Pulm: normal wob on RA, no crackles or wheezing Abdomen: soft, non-tender, hypoactive bowel sounds MSK: Post op wound dressings in place on R hip Extr: warm, dry, no LE edema  Pertinent Labs, Studies, and Procedures:     Latest Ref Rng & Units 09/29/2022   10:42 AM 09/27/2022    8:47 AM 09/27/2022    7:16 AM  CBC  WBC 4.0 - 10.5 K/uL 10.8   15.6   Hemoglobin 12.0 - 15.0 g/dL 86.5  9.8  5.5   Hematocrit 36.0 - 46.0 % 31.9  31.0  17.4   Platelets 150 - 400 K/uL 389   420        Latest Ref Rng & Units 09/27/2022   12:06 PM 09/26/2022    2:27 AM 09/25/2022    3:25 AM  BMP  Glucose 70 - 99 mg/dL 93  784  91   BUN 8 - 23 mg/dL 29  17  17    Creatinine 0.44 - 1.00 mg/dL 6.96  2.95  2.84   Sodium 135 - 145 mmol/L 131  132  134   Potassium 3.5 - 5.1 mmol/L 3.6  3.2  3.5   Chloride 98 - 111 mmol/L 92  91  98   CO2 22 - 32 mmol/L 25  27  25    Calcium 8.9 - 10.3 mg/dL 8.9  9.1  9.0     DG Chest Port 1 View  IMPRESSION: 1. No acute radiographic findings. 2. Emphysema and chronic left apical and lingular radiation fibrosis. Background subpleural interstitial fibrosis 3. Cardiomegaly without evidence of acute CHF or pneumonia. 4. Aortic atherosclerosis.   On: 08/27/2022 06:51  DG Femur Min 2 Views Right  IMPRESSION: Acute, intertrochanteric right femur fracture.   On: 09/21/2022 05:09  DG Pelvis Portable  IMPRESSION: 1. Limited nondiagnostic study, as above. Known intertrochanteric fracture of the right hip not well demonstrated.   On: 09/21/2022 05:15  DG Chest Port 1 View  IMPRESSION: 1. Severely limited study due to positioning. No definite acute finding. 2. Chronic cardiomegaly, chronic lung disease, and post treatment left breast/chest.   On: 09/21/2022 05:11  CT HEAD WO CONTRAST  IMPRESSION: 1. No acute intracranial abnormalities. The appearance of  the brain is normal for age.   On:  09/21/2022 07:52  DG FEMUR, MIN 2 VIEWS RIGHT  IMPRESSION: Intraoperative fluoroscopic guidance for cephalomedullary nail fixation of the proximal right femur.   On: 09/23/2022 14:58  DG FEMUR PORT  IMPRESSION: Reduction and internal fixation of intertrochanteric fracture of right femur with intramedullary rod.   On: 09/23/2022 15:48  Discharge Instructions: Discharge Instructions     Call MD for:  redness, tenderness, or signs of infection (pain, swelling, redness, odor or green/yellow discharge around incision site)   Complete by: As directed    Call MD for:  severe uncontrolled pain   Complete by: As directed    Call MD for:  temperature >100.4   Complete by: As directed    Diet - low sodium heart healthy   Complete by: As directed    Discharge instructions   Complete by: As directed    To whom it may concern:  This patient is on warfarin therapy chronically and is currently being bridged with lovenox. She will need to take lovenox 70 mg twice daily in addition to her warfarin 7.5 mg daily.   Please check PT/INR DAILY and call Dr. Hulen Luster (pharmacist at the [facility]) with the results. He manages her INR and will adjust her medications as appropriate. Call [phone] and ask to speak with [name] to manage this.   Once INR >2.5, we will need to discontinue lovenox injections. After discharge from SNF, she will follow up with [name] and her PCP in-person at the internal medicine center, and her daughter [name]) will go back to checking finger stick/point of care INR values and reporting them to [name].   In addition, she can take oxycodone 5 mg every 4 hours, as needed, for acute pain (post-operatively). After one week, she should go back to her home Norco 5-325 mg q4h prn. She should not be taking her home Norco dose while on the oxycodone.   Lastly, we also started her on once weekly vitamin D, for her new diagnosis of osteoporosis.   She  should NOT be taking amiodarone or spironolactone - these will need to be addressed when she follows up with her PCP after discharge from SNF. If you have any questions or concerns, call our clinic at [phone] or after hours call [phone] and ask for the internal medicine resident on call.   Increase activity slowly   Complete by: As directed       Signed: Karoline Caldwell, MD 09/29/2022, 2:58 PM   Pager: [phone]

## 2022-09-29 NOTE — TOC Progression Note (Addendum)
Transition of Care [facility]) - Progression Note    Patient Details  Name: Kim Anthony [name]: [id number] Date of Birth: 19-Nov-1950  [facility]) CM/SW Contact  Lorri Frederick, LCSW Phone Number: 09/29/2022, 10:51 AM  Clinical Narrative:  Pt auth still pending in Navi.   1300: TC Navi.  Need confirmation of medical clearance.  Need new PT note by 1730 today or will go to medical review.  PT notified and will try to see this afternoon.   1400: Auth approved: [id number], [id number], 3 days: 4/9-4/11.  CSW confirmed with [facility] they can accept pt today.  She confirmed, needs DC summary by 330.  MD notified.   Expected Discharge Plan: Skilled Nursing Facility Barriers to Discharge: SNF Pending bed offer  Expected Discharge Plan and Services In-house Referral: Clinical Social Work   Post Acute Care Choice: Skilled Nursing Facility Living arrangements for the past 2 months: Single Family Home                                       Social Determinants of Health (SDOH) Interventions SDOH Screenings   Food Insecurity: No Food Insecurity (09/21/2022)  Housing: Low Risk  (09/21/2022)  Transportation Needs: No Transportation Needs (09/21/2022)  Utilities: Not At Risk (09/21/2022)  Recent Concern: Utilities - At Risk (07/03/2022)  Depression (PHQ2-9): Low Risk  (07/02/2022)  Financial Resource Strain: Medium Risk (02/27/2021)  Social Connections: Socially Isolated (11/28/2019)  Tobacco Use: High Risk (09/25/2022)    Readmission Risk Interventions    02/19/2022    3:36 PM  Readmission Risk Prevention Plan  Transportation Screening Complete  PCP or Specialist Appt within 3-5 Days Complete  HRI or Home Care Consult Complete  Social Work Consult for Recovery Care Planning/Counseling Complete  Palliative Care Screening Not Applicable  Medication Review Oceanographer) Complete

## 2022-09-29 NOTE — Progress Notes (Deleted)
   CC: ***  HPI:Ms.LUNDIN KOPELMAN is a 72 y.o. female who presents for evaluation of ***. Please see individual problem based A/P for details.  71 yof, hx of mechanical MVR on warfarin, recently broke her hip and had it repaired  Hip fracture DEXA?  Mechanical valve Warfarin INR   Depression, PHQ-9: Based on the patients  Flowsheet Row Office Visit from 07/02/2022 in [facility]  PHQ-9 Total Score 1      score we have ***.  Past Medical History:  Diagnosis Date   Arthritis    Asthma    Breast cancer 2001   Left Breast Cancer   CHF (congestive heart failure)    COPD (chronic obstructive pulmonary disease)    Coronary artery disease    GERD (gastroesophageal reflux disease)    History of mitral valve replacement with mechanical valve    HLD (hyperlipidemia)    Hypertension    Pulmonary embolism 2021   Review of Systems:   ROS   Physical Exam: There were no vitals filed for this visit.   General: *** HEENT: Conjunctiva nl , antiicteric sclerae, moist mucous membranes, no exudate or erythema Cardiovascular: Normal rate, regular rhythm.  No murmurs, rubs, or gallops Pulmonary : Equal breath sounds, No wheezes, rales, or rhonchi Abdominal: soft, nontender,  bowel sounds present Ext: No edema in lower extremities, no tenderness to palpation of lower extremities.   Assessment & Plan:   See Encounters Tab for problem based charting.  Patient {GC/GE:[id number]::"discussed with","seen with"} Dr. {YEBXI:[id number]::"O. Hoffman","Guilloud","Mullen","Narendra","Raines","Vincent","Williams"}

## 2022-09-29 NOTE — Progress Notes (Signed)
Patient discharged, report called and given to the nurse Stacy at [facility]. Kim Anthony states no additional questions and or concerns at this time. AVS and Paperwork at bedside for transport. [facility] arranged and stated will arrive within 90 min to transport patient to facility.

## 2022-09-29 NOTE — Progress Notes (Signed)
IV removed with catheter intact. Transport leaving with patient at this time, in stable condition. All belongings sent with patient.

## 2022-09-29 NOTE — TOC Transition Note (Signed)
Transition of Care [facility]) - CM/SW Discharge Note   Patient Details  Name: Kim Anthony [name]: [id number] Date of Birth: 01-08-1951  Transition of Care [facility]) CM/SW Contact:  Lorri Frederick, LCSW Phone Number: 09/29/2022, 3:38 PM   Clinical Narrative:  Pt discharging to [place]. RN call report to [phone].     Final next level of care: Skilled Nursing Facility Barriers to Discharge: Barriers Resolved   Patient Goals and CMS Choice CMS Medicare.gov Compare Post Acute Care list provided to:: Patient Choice offered to / list presented to : Patient  Discharge Placement                Patient [facility] bed at: [facility] Patient to be transferred to facility by: [facility] Name of family member notified: daughter [name] Patient and family notified of of transfer: 09/29/22  Discharge Plan and Services Additional resources added to the After Visit Summary for   In-house Referral: Clinical Social Work   Post Acute Care Choice: Skilled Nursing Facility                               Social Determinants of Health (SDOH) Interventions SDOH Screenings   Food Insecurity: No Food Insecurity (09/21/2022)  Housing: Low Risk  (09/21/2022)  Transportation Needs: No Transportation Needs (09/21/2022)  Utilities: Not At Risk (09/21/2022)  Recent Concern: Utilities - At Risk (07/03/2022)  Depression (PHQ2-9): Low Risk  (07/02/2022)  Financial Resource Strain: Medium Risk (02/27/2021)  Social Connections: Socially Isolated (11/28/2019)  Tobacco Use: High Risk (09/25/2022)     Readmission Risk Interventions    02/19/2022    3:36 PM  Readmission Risk Prevention Plan  Transportation Screening Complete  PCP or Specialist Appt within 3-5 Days Complete  HRI or Home Care Consult Complete  Social Work Consult for Recovery Care Planning/Counseling Complete  Palliative Care Screening Not Applicable  Medication Review Oceanographer) Complete

## 2022-09-29 NOTE — Progress Notes (Signed)
ANTICOAGULATION CONSULT NOTE - Follow Up Consult  Pharmacy Consult for Warfarin and Enoxaparin Indication:  mechanical MVR, atrial fibrillation and hx PE/DVT  Allergies  Allergen Reactions   Lisinopril Swelling   Acetaminophen-Codeine Itching   Propoxyphene Itching    Darvocet   Tape Rash    Plastic   Tramadol Itching, Nausea And Vomiting and Rash    Patient Measurements: Height: 5\' 5"  (165.1 cm) Weight: 67.3 kg (148 lb 5.9 oz) IBW/kg (Calculated) : 57 Enoxaparin Dosing Weight: 67.3 kg  Vital Signs: Temp: 98.3 F (36.8 C) (04/09 0811) Temp Source: Oral (04/09 0811) BP: 101/60 (04/09 0811) Pulse Rate: 84 (04/09 0811)  Labs: Recent Labs    09/27/22 0341 09/27/22 0716 09/27/22 0716 09/27/22 0847 09/27/22 1206 09/28/22 0307 09/29/22 1042  HGB  --  5.5*   < > 9.8*  --   --  10.2*  HCT  --  17.4*  --  31.0*  --   --  31.9*  PLT  --  420*  --   --   --   --  389  LABPROT 15.9*  --   --   --   --  17.3* 16.1*  INR 1.3*  --   --   --   --  1.4* 1.3*  CREATININE  --   --   --   --  1.02*  --   --    < > = values in this interval not displayed.     Estimated Creatinine Clearance: 44.9 mL/min (A) (by C-G formula based on SCr of 1.02 mg/dL (H)).  Assessment: 81 YOF presenting 09/21/22 with R hip fracture 2/2 fall on warfarin PTA with INR on admission 3.3. Pt has a history of PE/DVT, mechanical MVR, and Afib and was taking warfarin 5 mg po daily prior to admission. Last dose of warfarin was taken on 09/20/22 at 17:30. INR was therapeutic at 3.3 on admit date 09/21/22.  Warfarin held for surgery and Vitamin K given to reverse warfarin. Pharmacy consulted to initiate heparin bridging once INR < 2.5.  Heparin held for OR 4/3.  Warfarin resumed post-op 4/3 pm, s/p R IM nail.  Heparin changed to full-dose Enoxaparin on 4/4 pm due to IV issues. Enoxaparin 70 mg SQ every 12 hours until INR =or >2.5.  Today 09/29/22 the INR is 1.3 after Warfarin 7.5 mg x 6 days and after s/p total 15 mg  IV Vitamin K given to reverse warfarin on 4/2. May take some time for INR to trend back up. Hgb 10.2 stable, pltc wnl stable.  No bleeding reported.  Goal INR is 2.5-3.5 . *Noted plan for [name], [name] to follow-up warfarin dosing as outpatient, as previously managing [facility].   PTA Warfarin regimen: 5 mg daily. INR was 3.3 on admit date 09/21/22.   Goal of Therapy:  INR 2.5-3.5 Anti-Xa level 0.6-1 units/ml 4hrs after LMWH dose given Monitor platelets by anticoagulation protocol: Yes   Plan:  Continue Warfarin 7.5 mg daily for now.  Continue Enoxaparin 70 mg SQ q12h until INR at goal Daily PT/INR. Intermittent CBC. Outpatient follow up with [name], PharmD   Thank you for allowing pharmacy to be part of this patients care team.  Kim Anthony, RPh Clinical Pharmacist 09/29/2022,12:12 PM Please check AMION for all [facility] Pharmacy phone numbers After 10:00 PM, call Main Pharmacy [phone]

## 2022-09-30 ENCOUNTER — Telehealth: Payer: Self-pay | Admitting: Pharmacist

## 2022-09-30 NOTE — Telephone Encounter (Signed)
Discussed with Nurse Supervisor at [facility] the patient's FS POC INR for today = 1.5 while on warfarin 7.5 milligrams PO every day. This is post-discharge from hospital day 2 (discharged on 09/29/22). Nursing staff are performing daily FS POC INRs and will be communicating the results to me. Originally per discharge orders/summary to facility, Dr. Peterson Lombard had indicated daily INRs. Given that the patient received 15 milligrams of parenteral vitamin K1 preoperatively, I suspect the INR will  continue to be slow to bump. Given this, I have advised to obtain the next INR on Friday, 12-APR-24 and communicate results directly with me for subsequent warfarin dosing based upon the result. I reiterated to the Supervising Nurse that the patient requires 1mg /kg of enoxaparin. The dispensing pharmacy (which delivers daily at dinner) had sent a 40 milligram PFS of enoxaparin + a 30 milligram PFS of enoxaparin for the 70 milligrams--which would have required "2" syringe sticks TWICE DAILY. I changed the order for the nurse to request of the facility's pharmacy that an 80 milligram PFS be sent (two syringes) for one 80 milligram syringe to be administered twice daily subcutaneously. She indicated she would do so and this evenings dose will be the 80 milligram syringe. I discussed with the supervising nurse that the requirement for the administering nurse to express out of the syringe the 10 mg/0.1 mL volume may be more difficult than simply giving the entire 80 mg/0.8 mL dose from the commercially available 80 mg PFS. She agrees. The patient will continue the 80 mg PFS enoxaparin SQ twice daily + 7.5 milligrams of warfarin once-daily, concomitantly until such time the INR is greater than or equal to 2.5 (target range 2.5 - 3.5).  Hulen Luster, PharmD, CPP Professor of Pharmacy Clinical Asst. Professor of Medicine

## 2022-10-01 ENCOUNTER — Telehealth: Payer: Self-pay | Admitting: Internal Medicine

## 2022-10-01 NOTE — Telephone Encounter (Signed)
Patient follows with Dr. Gala Romney with cardiology and our team contacted him regarding this patient's amiodarone. She had originally been advised to hold this medication, as her QTc was prolonged, and thus, this medication was also held at discharge. According to Dr. Gala Romney, this patient should restart amiodarone 200 mg daily, as QTc is <500.  Called [facility] (SNF that patient is currently residing at) and made RN aware of the medication change. The patient will start taking amiodarone 200 mg daily as of 4/11.   Elza Rafter, DO Internal Medicine Resident, PGY-2

## 2022-10-02 ENCOUNTER — Encounter: Payer: 59 | Attending: Physical Medicine and Rehabilitation | Admitting: Physical Medicine and Rehabilitation

## 2022-10-02 ENCOUNTER — Telehealth: Payer: Self-pay | Admitting: Pharmacist

## 2022-10-02 DIAGNOSIS — M25562 Pain in left knee: Secondary | ICD-10-CM | POA: Insufficient documentation

## 2022-10-02 DIAGNOSIS — G894 Chronic pain syndrome: Secondary | ICD-10-CM | POA: Insufficient documentation

## 2022-10-02 DIAGNOSIS — G8929 Other chronic pain: Secondary | ICD-10-CM | POA: Insufficient documentation

## 2022-10-02 DIAGNOSIS — K219 Gastro-esophageal reflux disease without esophagitis: Secondary | ICD-10-CM | POA: Insufficient documentation

## 2022-10-02 DIAGNOSIS — M25561 Pain in right knee: Secondary | ICD-10-CM | POA: Insufficient documentation

## 2022-10-02 NOTE — Telephone Encounter (Signed)
Nsg Spv Nikki at [facility] was called to learn POC FS INR result for today = 2.6 on 7.5 milligrams of warfarin qd since adm to the SNF w/concomitant enoxaparin. Will CONTINUE same warfarin dose, 7.5 milligrams PO qd until Monday 15APR24. D/C enoxaparin bridge therapy as INR has reached within target range of 2.5 - 3.5. No bleeding per RN assessment, patient is doing well in her rehab efforts.

## 2022-10-05 ENCOUNTER — Telehealth: Payer: Self-pay | Admitting: Pharmacist

## 2022-10-05 NOTE — Telephone Encounter (Signed)
Lowella Bandy, RN from [facility] calls reporting finger-stick point of care INR value for today after three successive doses of 7.5 millgrams of warfarin (last INR on Friday 12APR24 was 2.6 [Target range 2.5 - 3.5]). INR = 5.1   Patient has developed rash over thoracic/abdominal dermatome consistent with "shingles" for which she is being started on Valtrex/valacyclovir. DDI checker indicates on DDI's found.   Will OMIT two consecutive days of warfarin (Monday, [id number] and Tuesday 16APR24) REPEAT INR on Wednesday 17APR24 and I will call for INR and dose accordingly. No bleeding per Nurses assessment.

## 2022-10-06 ENCOUNTER — Ambulatory Visit (INDEPENDENT_AMBULATORY_CARE_PROVIDER_SITE_OTHER): Payer: 59 | Admitting: Physician Assistant

## 2022-10-06 ENCOUNTER — Encounter: Payer: Self-pay | Admitting: Orthopaedic Surgery

## 2022-10-06 ENCOUNTER — Other Ambulatory Visit (INDEPENDENT_AMBULATORY_CARE_PROVIDER_SITE_OTHER): Payer: 59

## 2022-10-06 DIAGNOSIS — S72141A Displaced intertrochanteric fracture of right femur, initial encounter for closed fracture: Secondary | ICD-10-CM

## 2022-10-06 NOTE — Telephone Encounter (Signed)
Discussed the patient with the supervising nurse at [facility]. INR = 5.1 after 3 consecutive daily doses of warfarin 7.5 milligrams. Nurse was advised to OMIT doses of warfarin on Monday, April 15 and Tuesday, April 16. Repeat FS POC INR on Wednesday, October 07, 2022. No bleeding per nurses assessment.

## 2022-10-06 NOTE — Progress Notes (Signed)
Post-Op Visit Note   Patient: Kim Anthony           Date of Birth: December 26, 1950           [name]: [id number] Visit Date: 10/06/2022 PCP: Willette Cluster, MD   Assessment & Plan:  Chief Complaint:  Chief Complaint  Patient presents with   Right Hip - Follow-up    IM nailing of intertroch fracture 09/23/2022   Visit Diagnoses:  1. Closed intertrochanteric fracture of right femur, initial encounter     Plan: Patient is a pleasant 72 year old female who comes in today 2 weeks status post right hip IM nail from an intertrochanteric femur fracture, date of surgery 09/23/2022.  She has been staying at [facility] where she is getting PT/OT 5 times a week.  She is in a fair amount of pain primarily to the lateral hip.  She denies any fevers or chills or any other systemic symptoms.  Examination of the lateral hip reveals well-healed surgical incisions with staples intact.  No evidence of infection or cellulitis.  She does have a large area of induration to the distal incision at the proximal hip.  No drainage, signs of cellulitis or infection.  This is moderately tender, however.  Of note, patient is on Coumadin.  Today, I attempted aspiration to this area but was unsuccessful.  I believe the area is coagulated blood and I recommended heat and ice.  Should she develop any fevers or chills or any other signs of infection she will let us know.  Otherwise, she will follow-up in 4 weeks for repeat evaluation and x-rays of the right femur.  Call with concerns or questions in the meantime.  Follow-Up Instructions: Return in about 4 weeks (around 11/03/2022).   Orders:  Orders Placed This Encounter  Procedures   XR FEMUR, MIN 2 VIEWS RIGHT   No orders of the defined types were placed in this encounter.   Imaging: XR FEMUR, MIN 2 VIEWS RIGHT  Result Date: 10/06/2022 X-rays demonstrate stable alignment of the hardware without interval change   PMFS History: Patient Active Problem List   Diagnosis Date  Noted   Alteration in anticoagulation 09/25/2022   History of mitral valve replacement with mechanical valve 09/24/2022   Anticoagulated 09/24/2022   Closed fracture of right hip 09/23/2022   Closed intertrochanteric fracture of right femur, initial encounter 09/22/2022   H/O mitral valve replacement with mechanical valve 09/22/2022   Chronic HFrEF (heart failure with reduced ejection fraction) 09/22/2022   COVID-19 virus infection 08/19/2022   Endometrial thickening on ultrasound 07/07/2022   Gastroesophageal reflux disease without esophagitis 06/29/2022   Chronic pain of both knees 06/29/2022   Vaginal bleeding 04/01/2022   A-fib 03/13/2022   Facial edema 03/07/2022   Atypical atrial flutter    Combined systolic and diastolic heart failure 02/19/2022   CHF (congestive heart failure) 02/19/2022   Combined systolic and diastolic congestive heart failure 02/18/2022   Acute pulmonary edema    Edema of both lower extremities 01/13/2022   Hypotension 01/13/2022   Subacute cough 11/28/2021   COPD exacerbation 11/05/2021   Conjunctival hemorrhage of left eye 06/25/2021   Osteoarthritis of left knee 04/24/2021   Seronegative inflammatory arthritis 02/17/2021   High risk medication use 02/17/2021   Elevated transaminase level 01/27/2021   Acute idiopathic gout of multiple sites 09/30/2020   Chronic pain syndrome 01/17/2020   Monoarthritis of wrist 10/18/2019   Ganglion cyst 09/21/2019   Hip pain 08/23/2019   Thoracic  ascending aortic aneurysm 07/05/2019   Anemia 02/16/2019   Breast cancer    Acquired absence of left breast 12/20/2018   COPD (chronic obstructive pulmonary disease) 11/25/2017   S/P MVR (mitral valve replacement)    Chronic anticoagulation    Tobacco use disorder 02/25/2017   Lymphadenopathy, mediastinal 02/25/2017   Essential hypertension 02/01/2016   Allergic rhinitis 02/01/2016   Healthcare maintenance 01/30/2016   Chronic low back pain 07/22/2015   Pulmonary  embolism 07/11/2015   Chronic combined systolic and diastolic heart failure 07/11/2015   Headache, common migraine 07/11/2015   HLD (hyperlipidemia) 07/11/2015   Past Medical History:  Diagnosis Date   Arthritis    Asthma    Breast cancer 2001   Left Breast Cancer   CHF (congestive heart failure)    COPD (chronic obstructive pulmonary disease)    Coronary artery disease    GERD (gastroesophageal reflux disease)    History of mitral valve replacement with mechanical valve    HLD (hyperlipidemia)    Hypertension    Pulmonary embolism 2021    Family History  Problem Relation Age of Onset   Hypertension Mother    Cancer Father    Hypertension Father    Breast cancer Maternal Aunt 50   Heart attack Other     Past Surgical History:  Procedure Laterality Date   APPLICATION OF A-CELL OF CHEST/ABDOMEN Left 01/27/2019   Procedure: APPLICATION OF A-CELL OF CHEST;  Surgeon: Peggye Form, DO;  Location: [facility] OR;  Service: Plastics;  Laterality: Left;   APPLICATION OF WOUND VAC N/A 01/27/2019   Procedure: APPLICATION OF WOUND VAC;  Surgeon: Peggye Form, DO;  Location: [facility] OR;  Service: Plastics;  Laterality: N/A;   BREAST SURGERY Left 2001   for CA   CARDIOVERSION N/A 02/26/2022   Procedure: CARDIOVERSION;  Surgeon: Little Ishikawa, MD;  Location: [facility] ENDOSCOPY;  Service: Cardiovascular;  Laterality: N/A;   COLONOSCOPY WITH PROPOFOL N/A 06/07/2018   Procedure: COLONOSCOPY WITH PROPOFOL;  Surgeon: Charlott Rakes, MD;  Location: [facility] ENDOSCOPY;  Service: Endoscopy;  Laterality: N/A;   DEBRIDEMENT AND CLOSURE WOUND Left 12/28/2018   Procedure: Debridement And Closure Wound;  Surgeon: Abigail Miyamoto, MD;  Location: [facility] OR;  Service: General;  Laterality: Left;   INCISION AND DRAINAGE OF WOUND Left 01/27/2019   Procedure: IRRIGATION AND DEBRIDEMENT OF CHEST WALL;  Surgeon: Peggye Form, DO;  Location: [facility] OR;  Service: Plastics;  Laterality: Left;    INTRAMEDULLARY (IM) NAIL INTERTROCHANTERIC N/A 09/23/2022   Procedure: INTRAMEDULLARY (IM) NAIL INTERTROCHANTERIC;  Surgeon: Tarry Kos, MD;  Location: [facility] OR;  Service: Orthopedics;  Laterality: N/A;   KNEE ARTHROSCOPY WITH MEDIAL MENISECTOMY Left 08/06/2021   Procedure: KNEE ARTHROSCOPY WITH PARTIAL MEDIAL MENISECTOMY, DEBRIDEMENT;  Surgeon: Jene Every, MD;  Location: [facility] ORS;  Service: Orthopedics;  Laterality: Left;  1 HR   LATISSIMUS FLAP TO BREAST Left 01/18/2019   Procedure: LEFT LATISSIMUS FLAP TO BREAST;  Surgeon: Peggye Form, DO;  Location: [facility] OR;  Service: Plastics;  Laterality: Left;   MASTECTOMY Left    MITRAL VALVE REPLACEMENT     mechanical   POLYPECTOMY  06/07/2018   Procedure: POLYPECTOMY;  Surgeon: Charlott Rakes, MD;  Location: [facility] ENDOSCOPY;  Service: Endoscopy;;   RIGHT/LEFT HEART CATH AND CORONARY ANGIOGRAPHY N/A 01/09/2021   Procedure: RIGHT/LEFT HEART CATH AND CORONARY ANGIOGRAPHY;  Surgeon: Dolores Patty, MD;  Location: [facility] INVASIVE CV LAB;  Service: Cardiovascular;  Laterality: N/A;   WOUND DEBRIDEMENT Left  12/28/2018   Procedure: EXCISION OF LEFT CHRONIC CHEST WALL WOUND;  Surgeon: Abigail Miyamoto, MD;  Location: [facility] OR;  Service: General;  Laterality: Left;   Social History   Occupational History   Not on file  Tobacco Use   Smoking status: Every Day    Packs/day: 0.10    Years: 30.00    Additional pack years: 0.00    Total pack years: 3.00    Types: Cigarettes   Smokeless tobacco: Never   Tobacco comments:    5 cigs per day  Vaping Use   Vaping Use: Never used  Substance and Sexual Activity   Alcohol use: Yes    Alcohol/week: 3.0 standard drinks of alcohol    Types: 3 Standard drinks or equivalent per week    Comment: beer- ocasional   Drug use: No   Sexual activity: Not Currently    Birth control/protection: None

## 2022-10-06 NOTE — Telephone Encounter (Signed)
Evaluation and management procedures were performed by the Clinical Pharmacy Practitioner under my supervision and collaboration. I have reviewed the Practitioner's note and chart, and I agree with the management and plan as documented above. ° °

## 2022-10-07 ENCOUNTER — Other Ambulatory Visit: Payer: Self-pay

## 2022-10-07 ENCOUNTER — Encounter (HOSPITAL_COMMUNITY): Payer: Self-pay | Admitting: Emergency Medicine

## 2022-10-07 ENCOUNTER — Telehealth: Payer: Self-pay | Admitting: Pharmacist

## 2022-10-07 ENCOUNTER — Observation Stay (HOSPITAL_COMMUNITY)
Admission: EM | Admit: 2022-10-07 | Discharge: 2022-10-08 | Disposition: A | Discharge status: Skilled Nursing Facility | Payer: 59 | Attending: Internal Medicine | Admitting: Internal Medicine

## 2022-10-07 DIAGNOSIS — E872 Acidosis, unspecified: Secondary | ICD-10-CM | POA: Diagnosis not present

## 2022-10-07 DIAGNOSIS — Z86718 Personal history of other venous thrombosis and embolism: Secondary | ICD-10-CM | POA: Insufficient documentation

## 2022-10-07 DIAGNOSIS — N179 Acute kidney failure, unspecified: Secondary | ICD-10-CM | POA: Diagnosis not present

## 2022-10-07 DIAGNOSIS — Z952 Presence of prosthetic heart valve: Secondary | ICD-10-CM

## 2022-10-07 DIAGNOSIS — I4891 Unspecified atrial fibrillation: Secondary | ICD-10-CM | POA: Diagnosis not present

## 2022-10-07 DIAGNOSIS — X58XXXA Exposure to other specified factors, initial encounter: Secondary | ICD-10-CM | POA: Insufficient documentation

## 2022-10-07 DIAGNOSIS — I5022 Chronic systolic (congestive) heart failure: Secondary | ICD-10-CM | POA: Insufficient documentation

## 2022-10-07 DIAGNOSIS — D539 Nutritional anemia, unspecified: Secondary | ICD-10-CM | POA: Diagnosis present

## 2022-10-07 DIAGNOSIS — Z7901 Long term (current) use of anticoagulants: Secondary | ICD-10-CM | POA: Diagnosis not present

## 2022-10-07 DIAGNOSIS — J449 Chronic obstructive pulmonary disease, unspecified: Secondary | ICD-10-CM | POA: Diagnosis present

## 2022-10-07 DIAGNOSIS — S7291XA Unspecified fracture of right femur, initial encounter for closed fracture: Secondary | ICD-10-CM | POA: Insufficient documentation

## 2022-10-07 DIAGNOSIS — I10 Essential (primary) hypertension: Secondary | ICD-10-CM | POA: Diagnosis present

## 2022-10-07 DIAGNOSIS — I2510 Atherosclerotic heart disease of native coronary artery without angina pectoris: Secondary | ICD-10-CM | POA: Diagnosis not present

## 2022-10-07 DIAGNOSIS — B029 Zoster without complications: Secondary | ICD-10-CM | POA: Insufficient documentation

## 2022-10-07 DIAGNOSIS — I110 Hypertensive heart disease with heart failure: Secondary | ICD-10-CM | POA: Diagnosis not present

## 2022-10-07 DIAGNOSIS — R7401 Elevation of levels of liver transaminase levels: Secondary | ICD-10-CM | POA: Diagnosis present

## 2022-10-07 DIAGNOSIS — I5040 Unspecified combined systolic (congestive) and diastolic (congestive) heart failure: Secondary | ICD-10-CM | POA: Diagnosis present

## 2022-10-07 DIAGNOSIS — Z853 Personal history of malignant neoplasm of breast: Secondary | ICD-10-CM | POA: Diagnosis not present

## 2022-10-07 DIAGNOSIS — Z79899 Other long term (current) drug therapy: Secondary | ICD-10-CM | POA: Diagnosis not present

## 2022-10-07 DIAGNOSIS — E875 Hyperkalemia: Secondary | ICD-10-CM

## 2022-10-07 DIAGNOSIS — R799 Abnormal finding of blood chemistry, unspecified: Secondary | ICD-10-CM | POA: Diagnosis present

## 2022-10-07 LAB — CBC WITH DIFFERENTIAL/PLATELET
Abs Immature Granulocytes: 0.21 10*3/uL — ABNORMAL HIGH (ref 0.00–0.07)
Basophils Absolute: 0.1 10*3/uL (ref 0.0–0.1)
Basophils Relative: 1 %
Eosinophils Absolute: 0.5 10*3/uL (ref 0.0–0.5)
Eosinophils Relative: 5 %
HCT: 31.7 % — ABNORMAL LOW (ref 36.0–46.0)
Hemoglobin: 10 g/dL — ABNORMAL LOW (ref 12.0–15.0)
Immature Granulocytes: 2 %
Lymphocytes Relative: 20 %
Lymphs Abs: 2 10*3/uL (ref 0.7–4.0)
MCH: 32.6 pg (ref 26.0–34.0)
MCHC: 31.5 g/dL (ref 30.0–36.0)
MCV: 103.3 fL — ABNORMAL HIGH (ref 80.0–100.0)
Monocytes Absolute: 1.1 10*3/uL — ABNORMAL HIGH (ref 0.1–1.0)
Monocytes Relative: 11 %
Neutro Abs: 6.2 10*3/uL (ref 1.7–7.7)
Neutrophils Relative %: 61 %
Platelets: 736 10*3/uL — ABNORMAL HIGH (ref 150–400)
RBC: 3.07 MIL/uL — ABNORMAL LOW (ref 3.87–5.11)
RDW: 13.9 % (ref 11.5–15.5)
WBC: 10 10*3/uL (ref 4.0–10.5)
nRBC: 0 % (ref 0.0–0.2)

## 2022-10-07 LAB — PROTIME-INR
INR: 3.2 — ABNORMAL HIGH (ref 0.8–1.2)
Prothrombin Time: 32.4 seconds — ABNORMAL HIGH (ref 11.4–15.2)

## 2022-10-07 LAB — BASIC METABOLIC PANEL
Anion gap: 11 (ref 5–15)
BUN: 32 mg/dL — ABNORMAL HIGH (ref 8–23)
CO2: 17 mmol/L — ABNORMAL LOW (ref 22–32)
Calcium: 9.2 mg/dL (ref 8.9–10.3)
Chloride: 102 mmol/L (ref 98–111)
Creatinine, Ser: 1.72 mg/dL — ABNORMAL HIGH (ref 0.44–1.00)
GFR, Estimated: 31 mL/min — ABNORMAL LOW (ref >60)
Glucose, Bld: 94 mg/dL (ref 70–99)
Potassium: 4.9 mmol/L (ref 3.5–5.1)
Sodium: 130 mmol/L — ABNORMAL LOW (ref 135–145)

## 2022-10-07 MED ORDER — SODIUM CHLORIDE 0.9 % IV BOLUS
500.0000 mL | Freq: Once | INTRAVENOUS | Status: AC
  Administered 2022-10-07: 500 mL via INTRAVENOUS

## 2022-10-07 MED ORDER — AMIODARONE HCL 200 MG PO TABS
200.0000 mg | ORAL_TABLET | Freq: Every day | ORAL | Status: DC
  Administered 2022-10-08: 200 mg via ORAL
  Filled 2022-10-07: qty 1

## 2022-10-07 MED ORDER — HYDROCODONE-ACETAMINOPHEN 5-325 MG PO TABS
1.0000 | ORAL_TABLET | ORAL | Status: DC | PRN
  Administered 2022-10-08 (×3): 1 via ORAL
  Filled 2022-10-07 (×3): qty 1

## 2022-10-07 MED ORDER — POLYETHYLENE GLYCOL 3350 17 G PO PACK: 17.0000 g | PACK | Freq: Every day | ORAL | Status: DC | PRN

## 2022-10-07 MED ORDER — GABAPENTIN 300 MG PO CAPS
300.0000 mg | ORAL_CAPSULE | Freq: Three times a day (TID) | ORAL | Status: DC
  Administered 2022-10-08: 300 mg via ORAL
  Filled 2022-10-07: qty 1

## 2022-10-07 MED ORDER — UMECLIDINIUM BROMIDE 62.5 MCG/ACT IN AEPB
1.0000 | INHALATION_SPRAY | Freq: Every day | RESPIRATORY_TRACT | Status: DC
  Administered 2022-10-08: 1 via RESPIRATORY_TRACT
  Filled 2022-10-07 (×2): qty 7

## 2022-10-07 MED ORDER — METOPROLOL SUCCINATE ER 25 MG PO TB24
25.0000 mg | ORAL_TABLET | Freq: Every day | ORAL | Status: DC
  Administered 2022-10-08: 25 mg via ORAL
  Filled 2022-10-07: qty 1

## 2022-10-07 MED ORDER — LACTATED RINGERS IV SOLN: INTRAVENOUS | Status: AC

## 2022-10-07 MED ORDER — SIMVASTATIN 20 MG PO TABS
20.0000 mg | ORAL_TABLET | Freq: Every day | ORAL | Status: DC
  Administered 2022-10-08: 20 mg via ORAL
  Filled 2022-10-07: qty 1

## 2022-10-07 MED ORDER — FLUTICASONE FUROATE-VILANTEROL 100-25 MCG/ACT IN AEPB
1.0000 | INHALATION_SPRAY | Freq: Every day | RESPIRATORY_TRACT | Status: DC
  Administered 2022-10-08: 1 via RESPIRATORY_TRACT
  Filled 2022-10-07: qty 28

## 2022-10-07 MED ORDER — DULOXETINE HCL 20 MG PO CPEP
20.0000 mg | ORAL_CAPSULE | Freq: Every day | ORAL | Status: DC
  Administered 2022-10-08: 20 mg via ORAL
  Filled 2022-10-07: qty 1

## 2022-10-07 MED ORDER — GABAPENTIN 600 MG PO TABS: 900.0000 mg | ORAL_TABLET | Freq: Three times a day (TID) | ORAL | Status: DC

## 2022-10-07 MED ORDER — HYDROCODONE-ACETAMINOPHEN 5-325 MG PO TABS
1.0000 | ORAL_TABLET | Freq: Once | ORAL | Status: AC
  Administered 2022-10-07: 1 via ORAL
  Filled 2022-10-07: qty 1

## 2022-10-07 NOTE — Patient Outreach (Signed)
  Care Coordination   Documentation   Visit Note   10/07/2022 Name: Kim Anthony [name]: [id number] DOB: 01-Feb-1951  Kim Anthony is a 72 y.o. year old female who sees Willette Cluster, MD for primary care.  Patient was not interviewed or contacted during this encounter.      What matters to the patients health and wellness today?  After reviewing the chart the RNCM was notified that the patient has been sent to [facility] for Rehab.    SDOH assessments and interventions completed:  No     Care Coordination Interventions:  No, not indicated   Follow up plan:  RNCM will keep watch on the patient for follow up.   Encounter Outcome:  Pt. Visit Completed   Juanell Fairly RN, BSN, [facility] Care Coordinator [facility]   Phone: [phone]

## 2022-10-07 NOTE — ED Provider Notes (Signed)
EMERGENCY DEPARTMENT AT [facility] Provider Note   CSN: [id number] Arrival date & time: 10/07/22  2028     History  Chief Complaint  Patient presents with   Abnormal Lab    Kim Anthony is a 72 y.o. female.  HPI 72 year old female presents with abnormal lab work.  She is currently in rehabilitation after having had a hip surgery.  She states she has had some pain but otherwise feeling well.  However when they checked lab work tonight her potassium came back at 6.5 and she was sent to the ER for evaluation.  She denies any recent medication changes or acute illness such as vomiting, diarrhea, etc.  She denies any weakness, cough, shortness of breath, etc.  Home Medications Prior to Admission medications   Medication Sig Start Date End Date Taking? Authorizing Provider  albuterol (VENTOLIN HFA) 108 (90 Base) MCG/ACT inhaler Inhale 2 puffs into the lungs every 6 (six) hours as needed for wheezing or shortness of breath. 07/02/22   Marolyn Haller, MD  amiodarone (PACERONE) 200 MG tablet Take 200 mg by mouth daily. 10/01/22   [provider]  diphenhydrAMINE (BENADRYL) 25 MG tablet Take 50-75 mg by mouth daily as needed for allergies.    [provider]  DULoxetine (CYMBALTA) 20 MG capsule Take 20 mg by mouth at bedtime.    [provider]  enoxaparin (LOVENOX) 30 MG/0.3ML injection Inject into the skin. 09/29/22   [provider]  enoxaparin (LOVENOX) 40 MG/0.4ML injection Inject into the skin. 09/29/22   [provider]  enoxaparin (LOVENOX) 80 MG/0.8ML injection Inject 0.7 mLs (70 mg total) into the skin 2 (two) times daily. 09/29/22   Atway, Derwood Kaplan, DO  FARXIGA 10 MG TABS tablet Take 1 tablet by mouth once daily 02/09/22   Gwenevere Abbot, MD  fluticasone [facility]) 50 MCG/ACT nasal spray Place 1 spray into both nostrils daily as needed for allergies. Patient taking differently: Place 2 sprays into both nostrils daily. 03/02/22  03/02/23  Marrianne Mood, MD  Fluticasone-Umeclidin-Vilant (TRELEGY ELLIPTA) 100-62.5-25 MCG/ACT AEPB Inhale 1 Inhalation into the lungs daily. 08/29/22   Adron Bene, MD  gabapentin (NEURONTIN) 600 MG tablet TAKE 1 & 1/2 (ONE & ONE-HALF) TABLETS BY MOUTH THREE TIMES DAILY Patient taking differently: Take 900 mg by mouth 3 (three) times daily. 07/27/22   Willette Cluster, MD  HYDROcodone-acetaminophen (NORCO) 5-325 MG tablet Take 1 tablet by mouth every 4 (four) hours as needed for moderate pain. 10/06/22   Atway, Rayann N, DO  losartan (COZAAR) 25 MG tablet Take 0.5 tablets (12.5 mg total) by mouth daily. 09/29/22 06/26/23  Atway, Derwood Kaplan, DO  methocarbamol (ROBAXIN) 500 MG tablet Take 500 mg by mouth 2 (two) times daily. 09/30/22   [provider]  metoprolol succinate (TOPROL-XL) 25 MG 24 hr tablet Take 1 tablet (25 mg total) by mouth daily. 06/03/22   Willette Cluster, MD  pantoprazole (PROTONIX) 40 MG tablet Take 1 tablet (40 mg total) by mouth daily. 09/20/22   Willette Cluster, MD  potassium chloride SA (KLOR-CON M) 20 MEQ tablet Take 2 tablets (40 mEq total) by mouth 2 (two) times daily. Patient taking differently: Take 20 mEq by mouth 2 (two) times daily. 05/13/22   Milford, Anderson Malta, FNP  simvastatin (ZOCOR) 20 MG tablet Take 1 tablet (20 mg total) by mouth daily. 03/02/22   Marrianne Mood, MD  Spacer/Aero-Holding Chambers (BREATHERITE COLL SPACER ADULT) MISC 1 applicator by Does not apply route daily.  01/11/20   [place]field, Carley D, DO  torsemide (DEMADEX) 20 MG tablet Take 2 tablets (40 mg total) by mouth daily. 05/13/22   Milford, Anderson Malta, FNP  Vitamin D, Ergocalciferol, (DRISDOL) 1.25 MG (50000 UNIT) CAPS capsule Take 1 capsule (50,000 Units total) by mouth every 7 (seven) days. 10/05/22   Atway, Derwood Kaplan, DO  warfarin (COUMADIN) 7.5 MG tablet Take 1 tablet (7.5 mg total) by mouth daily at 4 PM. 09/29/22   Atway, Rayann N, DO      Allergies    Lisinopril, Acetaminophen-codeine,  Propoxyphene, Tape, and Tramadol    Review of Systems   Review of Systems  Constitutional:  Negative for fever.  Respiratory:  Negative for shortness of breath.   Cardiovascular:  Negative for chest pain.  Gastrointestinal:  Negative for abdominal pain, diarrhea and vomiting.  Musculoskeletal:  Positive for arthralgias.  Neurological:  Negative for weakness and headaches.    Physical Exam Updated Vital Signs BP 110/69   Pulse 82   Temp 99.1 F (37.3 C) (Oral)   Resp 17   Ht  (1.651 m)   Wt 67.3 kg   SpO2 99%   BMI 24.69 kg/m  Physical Exam Vitals and nursing note reviewed.  Constitutional:      General: She is not in acute distress.    Appearance: She is well-developed. She is not ill-appearing or diaphoretic.  HENT:     Head: Normocephalic and atraumatic.  Cardiovascular:     Rate and Rhythm: Normal rate and regular rhythm.     Heart sounds: Normal heart sounds.  Pulmonary:     Effort: Pulmonary effort is normal.     Breath sounds: Normal breath sounds.  Abdominal:     Palpations: Abdomen is soft.     Tenderness: There is no abdominal tenderness.  Skin:    General: Skin is warm and dry.  Neurological:     Mental Status: She is alert.     ED Results / Procedures / Treatments   Labs (all labs ordered are listed, but only abnormal results are displayed) Labs Reviewed  BASIC METABOLIC PANEL - Abnormal; Notable for the following components:      Result Value   Sodium 130 (*)    CO2 17 (*)    BUN 32 (*)    Creatinine, Ser 1.72 (*)    GFR, Estimated 31 (*)    All other components within normal limits  CBC WITH DIFFERENTIAL/PLATELET - Abnormal; Notable for the following components:   RBC 3.07 (*)    Hemoglobin 10.0 (*)    HCT 31.7 (*)    MCV 103.3 (*)    Platelets 736 (*)    Monocytes Absolute 1.1 (*)    Abs Immature Granulocytes 0.21 (*)    All other components within normal limits  PROTIME-INR - Abnormal; Notable for the following components:    Prothrombin Time 32.4 (*)    INR 3.2 (*)    All other components within normal limits  URINALYSIS, W/ REFLEX TO CULTURE (INFECTION SUSPECTED)    EKG EKG Interpretation  Date/Time:  Wednesday October 07 2022 20:34:47 EDT Ventricular Rate:  89 PR Interval:  164 QRS Duration: 134 QT Interval:  381 QTC Calculation: 464 R Axis:   23 Text Interpretation: Sinus rhythm LVH with secondary repolarization abnormality no significant change since September 28 2022 Confirmed by Pricilla Loveless [phone]) on 10/07/2022 8:37:09 PM  Radiology XR FEMUR, MIN 2 VIEWS RIGHT  Result Date: 10/06/2022 X-rays demonstrate stable alignment  of the hardware without interval change   Procedures Procedures    Medications Ordered in ED Medications  lactated ringers infusion (has no administration in time range)  HYDROcodone-acetaminophen (NORCO/VICODIN) 5-325 MG per tablet 1 tablet (has no administration in time range)  sodium chloride 0.9 % bolus 500 mL (0 mLs Intravenous Stopped 10/07/22 2302)    ED Course/ Medical Decision Making/ A&P                             Medical Decision Making Amount and/or Complexity of Data Reviewed Labs: ordered.    Details: Acute kidney injury with a creatinine of 1.7.  Potassium is normal. ECG/medicine tests: ordered and independent interpretation performed.    Details: No significant change since earlier in the month.  No signs of hyperkalemia.  Risk Prescription drug management. Decision regarding hospitalization.   Patient presents with high potassium but her potassium today is okay.  However she does have a new AKI, unclear cause.  Will give some gentle fluids given her known history of CHF.  I have given her a small bolus and will put her on a rate.  And she should be admitted to ensure improvement in her AKI.  Discussed with internal medicine teaching service.        Final Clinical Impression(s) / ED Diagnoses Final diagnoses:  Acute kidney injury    Rx / DC  Orders ED Discharge Orders     None         Pricilla Loveless, MD 10/07/22 2307

## 2022-10-07 NOTE — ED Triage Notes (Signed)
Pt BIB EMS from [facility] for potassium of 6.5. Pt currently has shingles.  EMS VS: 106/60 HR 80 98% RA

## 2022-10-07 NOTE — Hospital Course (Signed)
Patient with worsening pain from rash on her back for the past 3 days. Reported leg shaking in the past couple of weeks. There is also some pain in the anterior R thigh into her groin.     Duloxetine was not on our discharge summary.

## 2022-10-07 NOTE — Telephone Encounter (Signed)
SNF provider calls with todays (fingerstick, point of care) = 5.4. Warfarin has been held for PAST TWO DAYS. Will OMIT/HOLD again today (making 3 consecutive days)-->REPEAT INR [id number]. NO bleeding. Cardiology HAS recommenced the amiodarone. When discharged from the hospital, was not on amiodarone (September 29, 2022). Was recommenced by Cardiology on October 01, 2022. This typically requires a 30 - 50% reduction in warfarin dosing. Will re-evaluate INR on Thursday October 08, 2022.

## 2022-10-08 ENCOUNTER — Observation Stay (HOSPITAL_COMMUNITY): Payer: 59

## 2022-10-08 ENCOUNTER — Other Ambulatory Visit: Payer: Self-pay | Admitting: Student

## 2022-10-08 DIAGNOSIS — N179 Acute kidney failure, unspecified: Secondary | ICD-10-CM | POA: Diagnosis not present

## 2022-10-08 DIAGNOSIS — E875 Hyperkalemia: Secondary | ICD-10-CM

## 2022-10-08 DIAGNOSIS — Z7901 Long term (current) use of anticoagulants: Secondary | ICD-10-CM | POA: Diagnosis not present

## 2022-10-08 HISTORY — DX: Hyperkalemia: E87.5

## 2022-10-08 LAB — CBC
HCT: 29.8 % — ABNORMAL LOW (ref 36.0–46.0)
Hemoglobin: 9.6 g/dL — ABNORMAL LOW (ref 12.0–15.0)
MCH: 33 pg (ref 26.0–34.0)
MCHC: 32.2 g/dL (ref 30.0–36.0)
MCV: 102.4 fL — ABNORMAL HIGH (ref 80.0–100.0)
Platelets: 770 10*3/uL — ABNORMAL HIGH (ref 150–400)
RBC: 2.91 MIL/uL — ABNORMAL LOW (ref 3.87–5.11)
RDW: 13.9 % (ref 11.5–15.5)
WBC: 9.8 10*3/uL (ref 4.0–10.5)
nRBC: 0 % (ref 0.0–0.2)

## 2022-10-08 LAB — CBG MONITORING, ED
Glucose-Capillary: 104 mg/dL — ABNORMAL HIGH (ref 70–99)
Glucose-Capillary: 106 mg/dL — ABNORMAL HIGH (ref 70–99)

## 2022-10-08 LAB — BASIC METABOLIC PANEL
Anion gap: 7 (ref 5–15)
BUN: 28 mg/dL — ABNORMAL HIGH (ref 8–23)
CO2: 18 mmol/L — ABNORMAL LOW (ref 22–32)
Calcium: 9 mg/dL (ref 8.9–10.3)
Chloride: 107 mmol/L (ref 98–111)
Creatinine, Ser: 1.32 mg/dL — ABNORMAL HIGH (ref 0.44–1.00)
GFR, Estimated: 43 mL/min — ABNORMAL LOW (ref >60)
Glucose, Bld: 104 mg/dL — ABNORMAL HIGH (ref 70–99)
Potassium: 4.2 mmol/L (ref 3.5–5.1)
Sodium: 132 mmol/L — ABNORMAL LOW (ref 135–145)

## 2022-10-08 LAB — CREATININE, URINE, RANDOM: Creatinine, Urine: 69 mg/dL

## 2022-10-08 LAB — PROTIME-INR
INR: 2.9 — ABNORMAL HIGH (ref 0.8–1.2)
Prothrombin Time: 30.4 seconds — ABNORMAL HIGH (ref 11.4–15.2)

## 2022-10-08 MED ORDER — VALACYCLOVIR HCL 500 MG PO TABS
1000.0000 mg | ORAL_TABLET | Freq: Three times a day (TID) | ORAL | Status: DC
  Administered 2022-10-08: 1000 mg via ORAL
  Filled 2022-10-08 (×3): qty 2

## 2022-10-08 MED ORDER — VALACYCLOVIR HCL 1 G PO TABS: 1000.0000 mg | ORAL_TABLET | Freq: Three times a day (TID) | ORAL | 0 refills | Status: DC

## 2022-10-08 MED ORDER — WARFARIN SODIUM 5 MG PO TABS: 5.0000 mg | ORAL_TABLET | Freq: Every day | ORAL | 0 refills | Status: DC

## 2022-10-08 MED ORDER — GABAPENTIN 300 MG PO CAPS: 300.0000 mg | ORAL_CAPSULE | Freq: Three times a day (TID) | ORAL | 0 refills | Status: DC

## 2022-10-08 NOTE — Discharge Summary (Addendum)
Name: Kim Anthony [name]: [id number] DOB: August 07, 1950 72 y.o. PCP: Willette Cluster, MD  Date of Admission: 10/07/2022  8:28 PM Date of Discharge: 10/08/2022 Attending Physician: Dickie La, MD  Discharge Diagnosis: 1. Principal Problem:   AKI (acute kidney injury) Active Problems:   Essential hypertension   COPD (chronic obstructive pulmonary disease)   Chronic anticoagulation   Macrocytic anemia   Elevated transaminase level   A-fib   Chronic HFrEF (heart failure with reduced ejection fraction)   History of mitral valve replacement with mechanical valve   Hyponatremia   Shingles   CAD  Discharge Medications: Allergies as of 10/08/2022       Reactions   Lisinopril Swelling   Acetaminophen-codeine Itching   Propoxyphene Itching   Darvocet   Tape Rash   Plastic   Tramadol Itching, Nausea And Vomiting, Rash        Medication List     STOP taking these medications    diphenhydrAMINE 25 MG tablet Commonly known as: BENADRYL   enoxaparin 80 MG/0.8ML injection Commonly known as: LOVENOX   gabapentin 600 MG tablet Commonly known as: NEURONTIN Replaced by: gabapentin 300 MG capsule   methocarbamol 500 MG tablet Commonly known as: ROBAXIN   potassium chloride SA 20 MEQ tablet Commonly known as: KLOR-CON M   predniSONE 10 MG tablet Commonly known as: DELTASONE       TAKE these medications    albuterol 108 (90 Base) MCG/ACT inhaler Commonly known as: VENTOLIN HFA Inhale 2 puffs into the lungs every 6 (six) hours as needed for wheezing or shortness of breath.   amiodarone 200 MG tablet Commonly known as: PACERONE Take 200 mg by mouth daily.   BreatheRite Coll Spacer Adult Misc 1 applicator by Does not apply route daily.   DULoxetine 20 MG capsule Commonly known as: CYMBALTA Take 20 mg by mouth at bedtime.   Farxiga 10 MG Tabs tablet Generic drug: dapagliflozin propanediol Take 1 tablet by mouth once daily   fluticasone 50 MCG/ACT nasal  spray Commonly known as: Flonase Place 1 spray into both nostrils daily as needed for allergies. What changed:  how much to take when to take this   gabapentin 300 MG capsule Commonly known as: NEURONTIN Take 1 capsule (300 mg total) by mouth 3 (three) times daily. Replaces: gabapentin 600 MG tablet   HYDROcodone-acetaminophen 5-325 MG tablet Commonly known as: Norco Take 1 tablet by mouth every 4 (four) hours as needed for moderate pain.   losartan 25 MG tablet Commonly known as: COZAAR Take 0.5 tablets (12.5 mg total) by mouth daily.   metoprolol succinate 25 MG 24 hr tablet Commonly known as: TOPROL-XL Take 1 tablet (25 mg total) by mouth daily.   pantoprazole 40 MG tablet Commonly known as: Protonix Take 1 tablet (40 mg total) by mouth daily.   simvastatin 20 MG tablet Commonly known as: ZOCOR Take 1 tablet (20 mg total) by mouth daily.   torsemide 20 MG tablet Commonly known as: DEMADEX Take 2 tablets (40 mg total) by mouth daily.   Trelegy Ellipta 100-62.5-25 MCG/ACT Aepb Generic drug: Fluticasone-Umeclidin-Vilant Inhale 1 Inhalation into the lungs daily.   valACYclovir 1000 MG tablet Commonly known as: VALTREX Take 1 tablet (1,000 mg total) by mouth 3 (three) times daily.   Vitamin D (Ergocalciferol) 1.25 MG (50000 UNIT) Caps capsule Commonly known as: DRISDOL Take 1 capsule (50,000 Units total) by mouth every 7 (seven) days.   warfarin 5 MG tablet Commonly known as: Coumadin Take  as directed. If you are unsure how to take this medication, talk to your nurse or doctor. Original instructions: Take 1 tablet (5 mg total) by mouth daily. What changed:  medication strength how much to take when to take this        Disposition and follow-up:   Kim Anthony was discharged from [facility] in Good condition.  At the hospital follow up visit please address:  1.    A. Hyperkalemia  -Resolved -Likely 2/2 losartan + home potassium  supplementation in the setting of AKI -Discontinued home potassium supplementation -Recommend repeat BMP in 1-3 days as outpatient and consider resuming potassium if needed   B. AKI  - Likely 2/2 dehydration from decreased PO intake -Encourage increased PO intake and have nursing staff reassure patient it is not a burden to get her up to the bathroom   C. Chronic warfarin therapy              - Discharged on warfarin -Therapeutic INR goal 2.5-3.5 -INR followed by [name]: daily INR must be communicated to [name] while at [facility] in order to adjust her dose of warfarin  D. Thrombocytosis -Suspect related to recent viral infection -Recheck CBC in 1-3 days to monitor  E. Herpes zoster infection -Continued oral Valtrex to complete 7-day course  Please check PT/INR DAILY and call Dr. Hulen Luster (pharmacist at the [facility]) with the results. He manages her INR and will adjust her medications as appropriate. Call [phone] and ask to speak with [name] to manage this. If unable to reach at the above number, you may try [phone] or email at [email or web address].   2.  Labs / imaging needed at time of follow-up: BMP, daily PT/INR, CBC  3.  Pending labs/ test needing follow-up: UA  Follow-up Appointments: -Please follow up with your primary care provider at the [facility] (your SNF will schedule your appointment) Phone: [phone] Address: Ground Floor - [facility], [address] [place], [place], [place] [address]   - Please follow up with orthopedic surgery (around 11/03/2022) as discussed at your recent visit. Phone: [phone] Address: [address], [place], [place] [address]  Hospital Course by problem list:  Kim Anthony is a 72 y.o. female with pertinent PMH of HTN, HFrEF, COPD, CAD, mechanical mitral valve on warfarin, paroxysmal A-fib, PE (LUL PE and 07/10/15 in setting of not taking warfarin x 5 days due to running  out after moving to [place]), RUE DVT (~2002 in setting of breast cancer treatment), and macrocytic anemia who presented with abnormal lab results of hyperkalemia and AKI from [facility] and is admitted for AKI.   #AKI, likely prerenal #Hyperkalemia (resolved), multifactorial from AKI and iatrogenic #Nonanion gap metabolic acidosis Was noted to have creatinine of 2.07 at [facility] with potassium of 6.5.  Labs on admission demonstrated creatinine 1.72 and potassium 4.9. Had mild metabolic acidosis.  Renal ultrasound was normal.  Creatinine improved further with IV fluids.  Had good urine output.  Suspect elevated creatinine is likely secondary to decreased PO intake at SNF.  Patient reports not wanting to drink as frequently at the SNF to avoid having the nurses repeatedly get her up to the bathroom. Instructed patient to increase PO intake at SNF and to not feel as though she is a burden for needing to use the bathroom.  Suspect elevated potassium was likely secondary to potassium supplementation in addition to home  losartan in the setting of AKI.  Held home potassium supplementation at discharge.  Continued home losartan.  Recommend repeat BMP in 1-3 days to reassess potassium and creatinine. No fever or leukocytosis. No urinary complaints at this time, however, UA was collected. Still pending UA.   #Hypovolemic hypotonic hyponatremia Mild hyponatremia, sodium 132 on day of discharge.  Suspect secondary to decreased PO intake and diuretic use.  Recommend repeat BMP as outpatient.   #S/p IM rod for right femur fracture on 09/23/2022 Was seen by orthopedic surgery as outpatient prior to admission.  Was noted to have mild swelling underneath her surgical incision site suspected to be coagulated hematomas.  Ortho was unable to drain anything from these areas.  No signs of wound infection at this time.  Recommend continuing PT at [facility] and outpatient follow-up with Ortho.   #Shingles Presented  with dermatomal rash on her left back and left flank w/o active vesicles.  Appears valacyclovir was started at [facility] on 4/15.  Will continue her course of valacyclovir until 4/21. Valacyclovir dose is 1000 mg 3 times daily.  She received her first dose on the day of discharge (4/18), will need second and third doses once transferred to [facility] today.   #History of PE in 2017 and upper extremity DVT in 2002 #Mechanical mitral valve #Chronic warfarin therapy #Paroxysmal A-fib PE (LUL PE and 07/10/15 in setting of not taking warfarin x 5 days due to running out after moving to [place]), RUE DVT (~2002 in setting of breast cancer treatment).  Not currently in A-fib and has good follow-up with cardiology (last seen PA on 09/11/2022, cardiologist is Dr. Gala Romney).  She is on warfarin which is managed in our clinic ([name]). Discharged on warfarin 5 mg daily. Patient to return to [place] [facility]. Therapeutic INR goal 2.5-3.5, followed by [name], daily INR must be communicated to [name] while at [facility] in order to adjust her dose of warfarin. INR 2.9 on day of discharge.   #Macrocytic anemia Potentially secondary to chronic alcohol use.  Hemoglobin stable since admission.  No signs of bleeding.  Iron studies demonstrate iron of 30 with saturation of 10.  Most recent colonoscopy was in 2019 with 1 polyp removed.  No complaints of melena or weight loss.  Recommend repeat colonoscopy as outpatient.  Also consider iron supplementation as outpatient.   #COPD Using Trelegy Ellipta and albuterol inhaler at home.  No signs of acute COPD exacerbation. Discharged on home trelegy ellipta and albuterol inhaler.    #HFrEF #CAD Taking dapagliflozin, losartan, metoprolol, and torsemide at home.  Initially held due to AKI.  No signs of acute heart failure exacerbation. Restarted these medications at discharge.  Discharge Exam:   BP 111/67   Pulse 90   Temp 98.1 F (36.7 C) (Oral)   Resp 16   Ht 5'  5" (1.651 m)   Wt 67.3 kg   SpO2 99%   BMI 24.69 kg/m  Constitutional: resting in bed, not in acute distress, pleasant Cardio: Regular rate and rhythm.  2+ bilateral radial and dorsalis pedis pulses. Pulm: normal work of breathing on room air, no wheezes or crackles Abdomen: Soft, non-tender, non-distended, normal bowel sounds. MSK: Trace bilateral LE pitting edema. Firm masses palpable beneath healing right femur surgical incisions without bleeding, drainage, or surrounding erythema/edema, mildly tender to palpation Skin: Crusted rash on left back/flank in L2/L3 distribution without vesicles. Neuro: AxO x3  Pertinent Labs, Studies, and Procedures:  Latest Ref Rng & Units 10/08/2022    2:59 AM 10/07/2022    9:50 PM 09/29/2022   10:42 AM  CBC  WBC 4.0 - 10.5 K/uL 9.8  10.0  10.8   Hemoglobin 12.0 - 15.0 g/dL 9.6  16.1  09.6   Hematocrit 36.0 - 46.0 % 29.8  31.7  31.9   Platelets 150 - 400 K/uL 770  736  389        Latest Ref Rng & Units 10/08/2022    2:59 AM 10/07/2022    9:50 PM 09/27/2022   12:06 PM  BMP  Glucose 70 - 99 mg/dL 045  94  93   BUN 8 - 23 mg/dL 28  32  29   Creatinine 0.44 - 1.00 mg/dL 4.09  8.11  9.14   Sodium 135 - 145 mmol/L 132  130  131   Potassium 3.5 - 5.1 mmol/L 4.2  4.9  3.6   Chloride 98 - 111 mmol/L 107  102  92   CO2 22 - 32 mmol/L Calcium 8.9 - 10.3 mg/dL 9.0  9.2  8.9     US RENAL  IMPRESSION: Unremarkable renal ultrasound.   On: 10/08/2022 00:31   Discharge Instructions: Discharge Instructions     Call MD for:  difficulty breathing, headache or visual disturbances   Complete by: As directed    Call MD for:  extreme fatigue   Complete by: As directed    Call MD for:  persistant dizziness or light-headedness   Complete by: As directed    Call MD for:  persistant nausea and vomiting   Complete by: As directed    Diet - low sodium heart healthy   Complete by: As directed    Increase activity slowly   Complete by: As  directed       You were hospitalized for elevated potassium and acute kidney injury.   Hospital Course: We suspect your elevated potassium was due to taking potassium supplements that were prescribed in addition to Losartan which can increase potassium. We would like to stop your potassium supplements at this time. We believe your acute kidney injury was due to not drinking enough fluids at your skilled nursing facility in addition to taking medications that increase the amount of urine you make. Please ensure that you are staying hydrated at your skilled nursing facility and requesting that nursing staff get you up to use the bathroom if need be. We decreased your gabapentin dose due to your decreased kidney function. Please discuss changing this dose of medication with your primary care provider. We will continue your course of valacyclovir for your shingles as instructed below.   Medications:   Please start taking: -Valacyclovir 1000 mg three times daily (finish 2nd/3rd doses on 10/08/22, then three times daily for 3 days afterwards) -Gabapentin 300 mg three times daily -Warfarin 5 mg daily   Please stop taking: -Potassium chloride 20 mEq -Methocarbamol 500 mg -Diphenhydramine 25 mg -Gabapentin 900 mg three times daily -Lovenox 80 mg -Warfarin 7.5 mg   Please continue taking: -Trelegy ellipta inhaler -Albuterol inhaler -Fluticasone nasal spray -Duloxetine 20 mg daily -Farxiga 10 mg daily -Losartan 12.5 mg daily -Metoprolol 25 mg daily -Torsemide 40 mg daily -Amiodarone 200 mg -Hydrocodone-acetaminophen 5-325 mg  -Protonix 40 mg daily -Simvastatin 20  mg daily -Vitamin D 78295 units once every 7 days     Physical Therapy/Occupational Therapy: you will return to [facility] skilled nursing facility  to get stronger prior to going home.    Follow-up: -Please follow up with your primary care provider at the [facility] (your SNF will schedule your  appointment) Phone: [phone] Address: Ground Floor - [facility], [address] [place], [place], [place] [address]   - Please follow up with orthopedic surgery (around 11/03/2022) as discussed at your recent visit. Phone: [phone] Address: [address], [place], [place] [address]  Signed: Karoline Caldwell, MD 10/08/2022, 11:15 AM   Pager: [phone]

## 2022-10-08 NOTE — H&P (Addendum)
Date: 10/08/2022               Patient Name:  Kim Anthony [name]: [id number]  DOB: Nov 19, 1950 Age / Sex: 72 y.o., female   PCP: Willette Cluster, MD         Medical Service: Internal Medicine Teaching Service         Attending Physician: Dr. Dickie La, MD      First Contact: Dr. Karoline Caldwell, MD Pager [phone]    Second Contact: Dr. Elza Rafter, DO Pager [phone]         After Hours (After 5p/  First Contact Pager: [phone]  weekends / holidays): Second Contact Pager: [phone]   SUBJECTIVE   Chief Complaint: AKI and Hyperkalemia  History of Present Illness:  Kim Anthony is a 72 year old female with a past medical history of HTN, HFrEF, COPD, CAD, mechanical mitral valve on warfarin, paroxysmal A-fib, PE (LUL PE and 07/10/15 in setting of not taking warfarin x 5 days due to running out after moving to [place]), RUE DVT (~2002 in setting of breast cancer treatment), and macrocytic anemia who presents from [facility] for hyperkalemia and AKI.  Her fluid intake is about 48 ounces of water a day which she states is about half of what she normally drinks at home.  She also notes decreased urine output with darker urine but no dysuria, hematuria, frequency, or urgency, or suprapubic pain.  She also denies any chest pain, shortness of breath, fever, abdominal pain, nausea, vomiting, constipation, diarrhea, melena, or hematochezia.  Patient does currently have shingles on her lumbar back on the left which she stated has been evaluated and is currently on treatment, although it is not reflected in the medication list she brought from [facility].  She has intermittent shakes and neuropathic pain with rash on her left lower back but denies any fevers or other associated symptoms. She was discharged on 09/29/2022 after undergoing hip surgery for her right femur fracture and is at [facility] for rehab.  She followed up with the PA for orthopedic surgery yesterday who removed the staples but found that  there were 2 firm masses underlying her incisions which could not be drained.  She states that they are tender but not getting any worse and otherwise does not have pain in her right leg.  ED Course: Hemodynamically stable upon arrival without any EKG changes.  Labs showed potassium of 4.9, creatinine of 1.72, BUN of 32, bicarb of 17, WBC of 10, hemoglobin of 10.0 with MCV of 103.3, and INR 3.2.  [facility] was paged for admission for AKI.  Past Medical History Hypertension HFrEF COPD CAD Mechanical mitral valve on warfarin Paroxysmal A-fib PE/upper extremity DVT in 2017 and 2002 Macrocytic anemia Hyperlipidemia History of breast cancer treated with mastectomy and chemoradiation in 2002  Meds:  Albuterol Amiodarone 200 mg daily Cymbalta 20 mg daily Dapagliflozin 10 mg daily Trelegy Gabapentin 900 mg 3 times daily Losartan 12.5 mg daily Metoprolol succinate 25 mg daily Pantoprazole 40 mg daily Potassium chloride 20 mEq twice daily Simvastatin 20 mg daily Torsemide 40 mg daily Warfarin 7.5 mg daily  Past Surgical History Past Surgical History:  Procedure Laterality Date   APPLICATION OF A-CELL OF CHEST/ABDOMEN Left 01/27/2019   Procedure: APPLICATION OF A-CELL OF CHEST;  Surgeon: Peggye Form, DO;  Location: [facility] OR;  Service: Plastics;  Laterality: Left;   APPLICATION OF WOUND VAC N/A 01/27/2019   Procedure: APPLICATION OF WOUND VAC;  Surgeon: Peggye Form,  DO;  Location: [facility] OR;  Service: Plastics;  Laterality: N/A;   BREAST SURGERY Left 2001   for CA   CARDIOVERSION N/A 02/26/2022   Procedure: CARDIOVERSION;  Surgeon: Little Ishikawa, MD;  Location: [facility] ENDOSCOPY;  Service: Cardiovascular;  Laterality: N/A;   COLONOSCOPY WITH PROPOFOL N/A 06/07/2018   Procedure: COLONOSCOPY WITH PROPOFOL;  Surgeon: Charlott Rakes, MD;  Location: [facility] ENDOSCOPY;  Service: Endoscopy;  Laterality: N/A;   DEBRIDEMENT AND CLOSURE WOUND Left 12/28/2018   Procedure: Debridement  And Closure Wound;  Surgeon: Abigail Miyamoto, MD;  Location: [facility] OR;  Service: General;  Laterality: Left;   INCISION AND DRAINAGE OF WOUND Left 01/27/2019   Procedure: IRRIGATION AND DEBRIDEMENT OF CHEST WALL;  Surgeon: Peggye Form, DO;  Location: [facility] OR;  Service: Plastics;  Laterality: Left;   INTRAMEDULLARY (IM) NAIL INTERTROCHANTERIC N/A 09/23/2022   Procedure: INTRAMEDULLARY (IM) NAIL INTERTROCHANTERIC;  Surgeon: Tarry Kos, MD;  Location: [facility] OR;  Service: Orthopedics;  Laterality: N/A;   KNEE ARTHROSCOPY WITH MEDIAL MENISECTOMY Left 08/06/2021   Procedure: KNEE ARTHROSCOPY WITH PARTIAL MEDIAL MENISECTOMY, DEBRIDEMENT;  Surgeon: Jene Every, MD;  Location: [facility] ORS;  Service: Orthopedics;  Laterality: Left;  1 HR   LATISSIMUS FLAP TO BREAST Left 01/18/2019   Procedure: LEFT LATISSIMUS FLAP TO BREAST;  Surgeon: Peggye Form, DO;  Location: [facility] OR;  Service: Plastics;  Laterality: Left;   MASTECTOMY Left    MITRAL VALVE REPLACEMENT     mechanical   POLYPECTOMY  06/07/2018   Procedure: POLYPECTOMY;  Surgeon: Charlott Rakes, MD;  Location: [facility] ENDOSCOPY;  Service: Endoscopy;;   RIGHT/LEFT HEART CATH AND CORONARY ANGIOGRAPHY N/A 01/09/2021   Procedure: RIGHT/LEFT HEART CATH AND CORONARY ANGIOGRAPHY;  Surgeon: Dolores Patty, MD;  Location: [facility] INVASIVE CV LAB;  Service: Cardiovascular;  Laterality: N/A;   WOUND DEBRIDEMENT Left 12/28/2018   Procedure: EXCISION OF LEFT CHRONIC CHEST WALL WOUND;  Surgeon: Abigail Miyamoto, MD;  Location: [facility] OR;  Service: General;  Laterality: Left;    Social:  Lives at home with daughter, currently at rehab at [facility] Level of Function: Independent in ADLs and IADLs PCP: Willette Cluster, MD Substances: 40+ years of 1/4 pack/day smoking history, no smoking for the past 3 weeks.  Drinks 6-12 oz beer a day. Denies current or prior drug use.  Family History:  Family History  Problem Relation Age of [place]   Hypertension Mother     Cancer Father    Hypertension Father    Breast cancer Maternal Aunt 50   Heart attack Other      Allergies: Allergies as of 10/07/2022 - Review Complete 10/07/2022  Allergen Reaction Noted   Lisinopril Swelling 01/07/2021   Acetaminophen-codeine Itching 07/10/2015   Propoxyphene Itching 01/18/2019   Tape Rash 03/09/2019   Tramadol Itching, Nausea And Vomiting, and Rash 07/10/2015    Review of Systems: A complete ROS was negative except as per HPI.   OBJECTIVE:   Physical Exam: Blood pressure 110/69, pulse 82, temperature 99.1 F (37.3 C), temperature source Oral, resp. rate 17, height  (1.651 m), weight 67.3 kg, SpO2 99 %.  Constitutional: Elderly female lying in bed appears mildly uncomfortable. In no acute distress. HENT: Normocephalic, atraumatic,  Eyes: Sclera non-icteric, PERRL, EOM intact Cardio:Regular rate and rhythm.  2+ bilateral radial and dorsalis pedis  pulses. Pulm:Clear to auscultation bilaterally. Normal work of breathing on room air. Abdomen: Soft, non-tender, non-distended, positive bowel sounds. MSK: Trace LE pitting edema. Firm masses beneath right  femur surgery incisions without discharge, mildly tender, no erythema or edema surrounding, consistent with coagulated hematoma. Skin: Crusted rash left L2/L3 distribution without vesicles. Neuro:Alert and oriented x3. No focal deficit noted. Psych:Pleasant mood and affect.    Labs: CBC    Component Value Date/Time   WBC 10.0 10/07/2022 2150   RBC 3.07 (L) 10/07/2022 2150   HGB 10.0 (L) 10/07/2022 2150   HGB 12.8 01/13/2022 1038   HCT 31.7 (L) 10/07/2022 2150   HCT 38.8 01/13/2022 1038   PLT 736 (H) 10/07/2022 2150   PLT 427 01/13/2022 1038   MCV 103.3 (H) 10/07/2022 2150   MCV 96 01/13/2022 1038   MCH 32.6 10/07/2022 2150   MCHC 31.5 10/07/2022 2150   RDW 13.9 10/07/2022 2150   RDW 11.4 (L) 01/13/2022 1038   LYMPHSABS 2.0 10/07/2022 2150   LYMPHSABS 3.0 04/21/2018 1624   MONOABS 1.1 (H)  10/07/2022 2150   EOSABS 0.5 10/07/2022 2150   EOSABS 0.2 04/21/2018 1624   BASOSABS 0.1 10/07/2022 2150   BASOSABS 0.1 04/21/2018 1624     CMP     Component Value Date/Time   NA 130 (L) 10/07/2022 2150   NA 132 (L) 03/02/2022 1617   K 4.9 10/07/2022 2150   CL 102 10/07/2022 2150   CO2 17 (L) 10/07/2022 2150   GLUCOSE 94 10/07/2022 2150   BUN 32 (H) 10/07/2022 2150   BUN 35 (H) 03/02/2022 1617   CREATININE 1.72 (H) 10/07/2022 2150   CALCIUM 9.2 10/07/2022 2150   PROT 7.1 09/21/2022 0415   PROT 7.9 01/27/2021 1015   ALBUMIN 3.7 09/21/2022 0415   ALBUMIN 4.6 01/27/2021 1015   AST 39 09/21/2022 0415   ALT 26 09/21/2022 0415   ALKPHOS 108 09/21/2022 0415   BILITOT 0.7 09/21/2022 0415   BILITOT 0.5 01/27/2021 1015   GFRNONAA 31 (L) 10/07/2022 2150   GFRAA 104 02/28/2020 1159    Imaging: US RENAL Result Date: 10/08/2022 IMPRESSION: Unremarkable renal ultrasound.  Electronically Signed   By: Tish Frederickson M.D.   On: 10/08/2022 00:31   XR FEMUR, MIN 2 VIEWS RIGHT Result Date: 10/06/2022 X-rays demonstrate stable alignment of the hardware without interval change    EKG: personally reviewed my interpretation is normal sinus rhythm with LVH. Consistent with prior EKG.  ASSESSMENT & PLAN:   Assessment & Plan by Problem: Principal Problem:   AKI (acute kidney injury) Active Problems:   Essential hypertension   COPD (chronic obstructive pulmonary disease)   Chronic anticoagulation   Macrocytic anemia   Elevated transaminase level   A-fib   Chronic HFrEF (heart failure with reduced ejection fraction)   History of mitral valve replacement with mechanical valve   Kim Anthony is a 72 y.o. female with pertinent PMH of HTN, HFrEF, COPD, CAD, mechanical mitral valve on warfarin, paroxysmal A-fib, PE (LUL PE and 07/10/15 in setting of not taking warfarin x 5 days due to running out after moving to [place]), RUE DVT (~2002 in setting of breast cancer treatment), and  macrocytic anemia who presented with abnormal lab results of hyperkalemia and AKI from [facility] and is admitted for AKI.  AKI, likely prerenal Hyperkalemia (resolved), multifactorial from AKI and iatrogenic Nonanion gap metabolic acidosis Creatinine 2.07 with BUN of 28, potassium of 6.5, bicarb of 16 and anion gap of 21 at 8 AM on 10/07/2022.  Initial labs here show creatinine of 1.72 with a BUN of 32, potassium of 4.9, bicarb of 17, and anion gap of 11.  All this can be explained by her decreased fluid intake and diuretic use causing AKI with potassium supplementation for her loop diuretic.  Renal ultrasound did not have any abnormalities.  Will further evaluate with urine urea and creatinine since she is on diuretics and check urinalysis.  Otherwise we will continue with IV fluid monitor urine output, and recheck her renal function in the morning. - LR 100 mL an hour - Urine urea nitrogen, urine creatinine, UA - BMP in the morning - Monitor urine output  Hypovolemic hypotonic hyponatremia Sodium 127 at [facility] with osmolality of 260.  Due to decreased oral intake and diuretic use.  Improved to 130 here.  We will continue to monitor. - BMP in the morning  S/p IM rod for right femur fracture on 09/23/2022 She is 2 likely hematomas that have coagulated under her surgical incisions.  This was evaluated by orthopedic surgery PA 2 days ago and recommended heat and ice after she is not able to drain anything from the areas.  Otherwise doing well postop and rehab at [facility].  Shingles Patient said that she had been given medication for shingles but this does not appear on the list of medications provided from [facility].  She does not have any active vesicles as all of them have crusted over or some of them pulled off by adhesive bandages so there is no indication for treatment at this time.  History of PE in 2017 and upper extremity DVT in 2002 Mechanical mitral valve Chronic warfarin  therapy Paroxysmal A-fib PE (LUL PE and 07/10/15 in setting of not taking warfarin x 5 days due to running out after moving to [place]), RUE DVT (~2002 in setting of breast cancer treatment).  Not currently in A-fib and has good follow-up with cardiology (last seen PA on 09/11/2022, cardiologist is Dr. Gala Romney).  She is on warfarin which is managed in our clinic.  INR here is 3.2. - Warfarin per pharmacy  Macrocytic anemia Stable macrocytic anemia here with hemoglobin of 10 and MCV of 103.  Could be due to her chronic alcohol use.  Iron studies done yesterday show iron of 30 with a saturation of 10.  Last colonoscopy appears to be in 2019 with 1 polyp removed.  She does not have any melena or weight loss.  Would recommend ensuring colonoscopy follow-up as she is likely due for repeat this year.  May also benefit from supplemental iron however iron studies from 03/2022 were within normal limits.  No signs of bleeding here.  COPD Does not appear to be in an exacerbation and we will continue her home inhalers or equivalents. - Breo Ellipta, Incruse Ellipta  HFrEF CAD As above follows with Dr. Gala Romney for cardiology and is on dapagliflozin, losartan, metoprolol, and torsemide.  With her prerenal AKI we will hold his medications and add back when able.  No signs of heart failure exacerbation.  Diet: Heart Healthy VTE:  warfarin IVF: LR,100cc/hr Code: Full  Dispo: Admit patient to Observation with expected length of stay less than 2 midnights.  Signed: Rocky Morel, DO Internal Medicine Resident PGY-1  10/08/2022, 1:22 AM   Dr. Karoline Caldwell, MD Pager [phone]

## 2022-10-08 NOTE — ED Notes (Signed)
[facility] contacted for transport 

## 2022-10-08 NOTE — Discharge Instructions (Addendum)
You were hospitalized for elevated potassium and acute kidney injury.  Hospital Course: We suspect your elevated potassium was due to taking potassium supplements that were prescribed in addition to Losartan which can increase potassium. We would like to stop your potassium supplements at this time. We believe your acute kidney injury was due to not drinking enough fluids at your skilled nursing facility in addition to taking medications that increase the amount of urine you make. Please ensure that you are staying hydrated at your skilled nursing facility and requesting that nursing staff get you up to use the bathroom if need be. We decreased your gabapentin dose due to your decreased kidney function. Please discuss changing this dose of medication with your primary care provider. We will continue your course of valacyclovir for your shingles as instructed below.  Medications:  Please start taking: -Valacyclovir 1000 mg three times daily (finish 2nd/3rd doses on 10/08/22, then three times daily for 3 days afterwards) -Gabapentin 300 mg three times daily -Warfarin 5 mg daily  Please stop taking: -Potassium chloride 20 mEq -Methocarbamol 500 mg -Diphenhydramine 25 mg -Gabapentin 900 mg three times daily -Lovenox 80 mg -Warfarin 7.5 mg  Please continue taking: -Trelegy ellipta inhaler -Albuterol inhaler -Fluticasone nasal spray -Duloxetine 20 mg daily -Farxiga 10 mg daily -Losartan 12.5 mg daily -Metoprolol 25 mg daily -Torsemide 40 mg daily -Amiodarone 200 mg -Hydrocodone-acetaminophen 5-325 mg  -Protonix 40 mg daily -Simvastatin 20  mg daily -Vitamin D 50354 units once every 7 days   Physical Therapy/Occupational Therapy: you will return to [facility] skilled nursing facility to get stronger prior to going home.   Follow-up: -Please follow up with your primary care provider at the [facility] (your SNF will schedule your appointment) Phone: [phone] Address: Ground Floor - [facility], [address] [place], [place], [place] [address]  - Please follow up with orthopedic surgery (around 11/03/2022) as discussed at your recent visit. Phone: [phone] Address: [address], [place], [place] [address]

## 2022-10-08 NOTE — ED Notes (Signed)
Pt is reporting shingles on low back x3 days.

## 2022-10-08 NOTE — Telephone Encounter (Signed)
Evaluation and management procedures were performed by the Clinical Pharmacy Practitioner under my supervision and collaboration. I have reviewed the Practitioner's note and chart, and I agree with the management and plan as documented above. ° °

## 2022-10-08 NOTE — Progress Notes (Signed)
ANTICOAGULATION CONSULT NOTE - Initial Consult  Pharmacy Consult for Warfarin  Indication: Mechanical MVR, History of VTE  Allergies  Allergen Reactions   Lisinopril Swelling   Acetaminophen-Codeine Itching   Propoxyphene Itching    Darvocet   Tape Rash    Plastic   Tramadol Itching, Nausea And Vomiting and Rash    Patient Measurements: Height:  (165.1 cm) Weight: 67.3 kg (148 lb 5.9 oz) IBW/kg (Calculated) : 57  Vital Signs: Temp: 99.1 F (37.3 C) (04/17 2036) Temp Source: Oral (04/17 2036) BP: 110/69 (04/17 2145) Pulse Rate: 82 (04/17 2145)  Labs: Recent Labs    10/07/22 2150  HGB 10.0*  HCT 31.7*  PLT 736*  LABPROT 32.4*  INR 3.2*  CREATININE 1.72*    Estimated Creatinine Clearance: 26.6 mL/min (A) (by C-G formula based on SCr of 1.72 mg/dL (H)).   Medical History: Past Medical History:  Diagnosis Date   Arthritis    Asthma    Breast cancer 2001   Left Breast Cancer   CHF (congestive heart failure)    COPD (chronic obstructive pulmonary disease)    Coronary artery disease    GERD (gastroesophageal reflux disease)    History of mitral valve replacement with mechanical valve    HLD (hyperlipidemia)    Hypertension    Pulmonary embolism 2021   Assessment: 72 y/o F on warfarin PTA for multiple indications. She was recently discharged to a SNF on 4/9. She was discharged on warfarin 7.5 mg daily. Note that on 4/11, pt was resumed on Amiodarone 200 mg daily. POC INR at the SNF went from 2.6>>5.1>>5.4. No warfarin was given at [facility] on 4/15, 4/16, and 4/17.   INR tonight at [facility] ED is 3.2   Goal of Therapy:  INR 2.5-3.5 Monitor platelets by anticoagulation protocol: Yes   Plan:  -Re-check one more INR with AM labs in a few hours to assess dosing needs -Will likely re-start reduced dosing in setting of Amiodarone interaction later today  Abran Duke, PharmD, BCPS Clinical Pharmacist Phone: [phone]

## 2022-10-08 NOTE — Consult Note (Signed)
Patient known to me from the [facility] where she is telephonically managed using patient self-testing, finger-stick, point of care INR device. I have been managing her warfarin since her admission on 9-APR-24 to [facility]. On 11-APR-24, amiodarone was added back to her medication list, I suspect accounting for her rapid hypoprothrombinemic response. Warfarin has been held for three consecutive days, April 15, 16 and 17. Her INR on admission has been reviewed.  This morning, I was alerted by the SNF/[facility] of the patient's transfer from SNF to [facility] 2/2 panic K. I have reviewed the Anticoagulation Consult  note by Abran Duke, PharmD. I would resume warfarin today, typically DDI of amiodarone + warfarin requires a 30 -50% dose reduction in warfarin dosing. With the observed fall/decline in INR after three doses, down to 3.2 I would suggest only the 30% reduction as I suspect Factors VII, IX, X and II are on the rebound (trending back up) making her potentially more hypercoagulable--especially if her INR were to continue in its downward drift.

## 2022-10-08 NOTE — ED Notes (Signed)
All belongings and paperwork give to [facility].

## 2022-10-08 NOTE — ED Notes (Signed)
Hospitalist team at bedside.

## 2022-10-08 NOTE — H&P (Incomplete)
Date: 10/08/2022               Patient Name:  Kim Anthony [name]: [id number]  DOB: 10/31/1950 Age / Sex: 72 y.o., female   PCP: Willette Cluster, MD         Medical Service: Internal Medicine Teaching Service         Attending Physician: Dr. Dickie La, MD      First Contact: {Intern/Pager23/24:28385}    Second Contact: {Resident/Pager23/24:28386}         After Hours (After 5p/  First Contact Pager: [phone]  weekends / holidays): Second Contact Pager: [phone]   SUBJECTIVE   Chief Complaint: ***  History of Present Illness:  ***  ED Course: ***  Past Medical History *** Past Medical History:  Diagnosis Date  . Arthritis   . Asthma   . Breast cancer 2001   Left Breast Cancer  . CHF (congestive heart failure)   . COPD (chronic obstructive pulmonary disease)   . Coronary artery disease   . GERD (gastroesophageal reflux disease)   . History of mitral valve replacement with mechanical valve   . HLD (hyperlipidemia)   . Hypertension   . Pulmonary embolism 2021     Meds:  *** No outpatient medications have been marked as taking for the 10/07/22 encounter [facility] Encounter).    Past Surgical History *** Past Surgical History:  Procedure Laterality Date  . APPLICATION OF A-CELL OF CHEST/ABDOMEN Left 01/27/2019   Procedure: APPLICATION OF A-CELL OF CHEST;  Surgeon: Peggye Form, DO;  Location: [facility] OR;  Service: Plastics;  Laterality: Left;  . APPLICATION OF WOUND VAC N/A 01/27/2019   Procedure: APPLICATION OF WOUND VAC;  Surgeon: Peggye Form, DO;  Location: [facility] OR;  Service: Plastics;  Laterality: N/A;  . BREAST SURGERY Left 2001   for CA  . CARDIOVERSION N/A 02/26/2022   Procedure: CARDIOVERSION;  Surgeon: Little Ishikawa, MD;  Location: [facility] ENDOSCOPY;  Service: Cardiovascular;  Laterality: N/A;  . COLONOSCOPY WITH PROPOFOL N/A 06/07/2018   Procedure: COLONOSCOPY WITH PROPOFOL;  Surgeon: Charlott Rakes, MD;  Location: [facility] ENDOSCOPY;  Service:  Endoscopy;  Laterality: N/A;  . DEBRIDEMENT AND CLOSURE WOUND Left 12/28/2018   Procedure: Debridement And Closure Wound;  Surgeon: Abigail Miyamoto, MD;  Location: [facility] OR;  Service: General;  Laterality: Left;  . INCISION AND DRAINAGE OF WOUND Left 01/27/2019   Procedure: IRRIGATION AND DEBRIDEMENT OF CHEST WALL;  Surgeon: Peggye Form, DO;  Location: [facility] OR;  Service: Plastics;  Laterality: Left;  . INTRAMEDULLARY (IM) NAIL INTERTROCHANTERIC N/A 09/23/2022   Procedure: INTRAMEDULLARY (IM) NAIL INTERTROCHANTERIC;  Surgeon: Tarry Kos, MD;  Location: [facility] OR;  Service: Orthopedics;  Laterality: N/A;  . KNEE ARTHROSCOPY WITH MEDIAL MENISECTOMY Left 08/06/2021   Procedure: KNEE ARTHROSCOPY WITH PARTIAL MEDIAL MENISECTOMY, DEBRIDEMENT;  Surgeon: Jene Every, MD;  Location: [facility] ORS;  Service: Orthopedics;  Laterality: Left;  1 HR  . LATISSIMUS FLAP TO BREAST Left 01/18/2019   Procedure: LEFT LATISSIMUS FLAP TO BREAST;  Surgeon: Peggye Form, DO;  Location: [facility] OR;  Service: Plastics;  Laterality: Left;  [name] MASTECTOMY Left   . MITRAL VALVE REPLACEMENT     mechanical  . POLYPECTOMY  06/07/2018   Procedure: POLYPECTOMY;  Surgeon: Charlott Rakes, MD;  Location: [facility] ENDOSCOPY;  Service: Endoscopy;;  . RIGHT/LEFT HEART CATH AND CORONARY ANGIOGRAPHY N/A 01/09/2021   Procedure: RIGHT/LEFT HEART CATH AND CORONARY ANGIOGRAPHY;  Surgeon: Dolores Patty, MD;  Location: [facility] INVASIVE CV  LAB;  Service: Cardiovascular;  Laterality: N/A;  . WOUND DEBRIDEMENT Left 12/28/2018   Procedure: EXCISION OF LEFT CHRONIC CHEST WALL WOUND;  Surgeon: Abigail Miyamoto, MD;  Location: [facility] OR;  Service: General;  Laterality: Left;    Social:  Lives *** Occupation: Support: Level of Function: PCP: Willette Cluster, MD Substances:  Family History:  ***  Allergies: Allergies as of 10/07/2022 - Review Complete 10/07/2022  Allergen Reaction Noted  . Lisinopril Swelling 01/07/2021  .  Acetaminophen-codeine Itching 07/10/2015  . Propoxyphene Itching 01/18/2019  . Tape Rash 03/09/2019  . Tramadol Itching, Nausea And Vomiting, and Rash 07/10/2015    Review of Systems: A complete ROS was negative except as per HPI.   OBJECTIVE:   Physical Exam: Blood pressure 110/69, pulse 82, temperature 99.1 F (37.3 C), temperature source Oral, resp. rate 17, height 5\' 5"  (1.651 m), weight 67.3 kg, SpO2 99 %.  Constitutional:***. In no acute distress. HENT: Normocephalic, atraumatic,  Eyes: Sclera non-icteric, PERRL, EOM intact Neck:{Exam; head & neck:17739} Cardio:Regular rate and rhythm. No murmurs, rubs, or gallops. 2+ bilateral {PulseLoc:28294} pulses. Pulm:Clear to auscultation bilaterally. Normal work of breathing on room air. Abdomen: Soft, non-tender, non-distended, positive bowel sounds. QVZ:DGLOVFIE for extremity edema. Skin:Warm and dry. Neuro:Alert and oriented x3. No focal deficit noted. Psych:Pleasant mood and affect.  Labs: CBC    Component Value Date/Time   WBC 10.0 10/07/2022 2150   RBC 3.07 (L) 10/07/2022 2150   HGB 10.0 (L) 10/07/2022 2150   HGB 12.8 01/13/2022 1038   HCT 31.7 (L) 10/07/2022 2150   HCT 38.8 01/13/2022 1038   PLT 736 (H) 10/07/2022 2150   PLT 427 01/13/2022 1038   MCV 103.3 (H) 10/07/2022 2150   MCV 96 01/13/2022 1038   MCH 32.6 10/07/2022 2150   MCHC 31.5 10/07/2022 2150   RDW 13.9 10/07/2022 2150   RDW 11.4 (L) 01/13/2022 1038   LYMPHSABS 2.0 10/07/2022 2150   LYMPHSABS 3.0 04/21/2018 1624   MONOABS 1.1 (H) 10/07/2022 2150   EOSABS 0.5 10/07/2022 2150   EOSABS 0.2 04/21/2018 1624   BASOSABS 0.1 10/07/2022 2150   BASOSABS 0.1 04/21/2018 1624     CMP     Component Value Date/Time   NA 130 (L) 10/07/2022 2150   NA 132 (L) 03/02/2022 1617   K 4.9 10/07/2022 2150   CL 102 10/07/2022 2150   CO2 17 (L) 10/07/2022 2150   GLUCOSE 94 10/07/2022 2150   BUN 32 (H) 10/07/2022 2150   BUN 35 (H) 03/02/2022 1617   CREATININE 1.72  (H) 10/07/2022 2150   CALCIUM 9.2 10/07/2022 2150   PROT 7.1 09/21/2022 0415   PROT 7.9 01/27/2021 1015   ALBUMIN 3.7 09/21/2022 0415   ALBUMIN 4.6 01/27/2021 1015   AST 39 09/21/2022 0415   ALT 26 09/21/2022 0415   ALKPHOS 108 09/21/2022 0415   BILITOT 0.7 09/21/2022 0415   BILITOT 0.5 01/27/2021 1015   GFRNONAA 31 (L) 10/07/2022 2150   GFRAA 104 02/28/2020 1159    Imaging: *** XR FEMUR, MIN 2 VIEWS RIGHT  Result Date: 10/06/2022 X-rays demonstrate stable alignment of the hardware without interval change    EKG: personally reviewed my interpretation is***. Consistent with prior EKG***.  ASSESSMENT & PLAN:   Assessment & Plan by Problem: Principal Problem:   AKI (acute kidney injury)   GURSEERAT STIVES is a 72 y.o. female with pertinent PMH of *** who presented with *** and is admitted for ***.  *** ***  *** ***  *** ***  *** ***  *** ***  *** ***  Diet: {NAMES:3044014::"Normal","Heart Healthy","Carb-Modified","Renal","Carb/Renal","NPO","TPN","Tube Feeds"} VTE: {NAMES:3044014::"Heparin","Enoxaparin","SCDs","DOAC","None"} IVF: {NAMES:3044014::"None","NS","1/2 NS","LR","D5","D10"},{NAMES:3044014::"None","10cc/hr","25cc/hr","50cc/hr","75cc/hr","100cc/hr","110cc/hr","125cc/hr","Bolus"} Code: {NAMES:3044014::"Full","DNR","DNI","DNR/DNI","Comfort Care","Unknown"}  Dispo: Admit patient to {STATUS:3044014::"Observation with expected length of stay less than 2 midnights.","Inpatient with expected length of stay greater than 2 midnights."}  Signed: Rocky Morel, DO Internal Medicine Resident PGY-1  10/08/2022, 12:03 AM   {Intern/Pager:[phone]}

## 2022-10-09 ENCOUNTER — Telehealth: Payer: Self-pay | Admitting: Pharmacist

## 2022-10-09 LAB — UREA NITROGEN, URINE: Urea Nitrogen, Ur: 501 mg/dL

## 2022-10-09 NOTE — Telephone Encounter (Signed)
SNF/[facility] Nurse calls reporting FS POC INR today 1.6 (Target Range 2.5 - 3.5) after 3 doses warfarin were held on 15/16/17 of April. On [id number], INR was 2.9 and warfarin was recommenced with  dose. INR today 1.6.Will bridge with LMWH /kg SQ/12h commencing today (RN confirms that the off-site pharmacy can provide the enoxaparin today) with warfarin to be administered at 7.5 milligrams, once-daily by mouth for today, Friday April 19, Saturday April 20 and Sunday April 21.Repeat FS POC INR to be collected on Monday October 12, 2022 and called to me for dosing recommendations based on the INR.

## 2022-10-12 ENCOUNTER — Telehealth: Payer: Self-pay | Admitting: Pharmacist

## 2022-10-12 NOTE — Telephone Encounter (Signed)
Nurse from SNF/[facility] calls with today's INR 2.1 (Target 2.5 - 3.5) after 3 successive doses of warfarin 7.5mg  + enoxaparin bridge therapy. Will give 7.5mg  warfarin today, Monday 22APR24, then Tuesday through Thursday of this week--only  warfarin with continued enoxaparin bridge therapy until such time INR is in range 2.5 - 3.5  Will re-evaluate INR response on FRIDAY 26APR24.

## 2022-10-15 LAB — BASIC METABOLIC PANEL: EGFR: 71

## 2022-10-16 ENCOUNTER — Encounter: Payer: Self-pay | Admitting: Student

## 2022-10-16 ENCOUNTER — Telehealth: Payer: Self-pay | Admitting: Pharmacist

## 2022-10-16 ENCOUNTER — Other Ambulatory Visit: Payer: Self-pay | Admitting: Student

## 2022-10-16 ENCOUNTER — Telehealth: Payer: Self-pay | Admitting: *Deleted

## 2022-10-16 DIAGNOSIS — D75839 Thrombocytosis, unspecified: Secondary | ICD-10-CM | POA: Insufficient documentation

## 2022-10-16 NOTE — Telephone Encounter (Signed)
Results of CBC and BMP drawn yesterday handed to Dr. Benito Mccreedy today at [phone]

## 2022-10-16 NOTE — Progress Notes (Signed)
Received call from [facility] provider because of thrombocytosis. Platelet count 1,008,000 on CBC. Patient is clinically well per report from provider. Recommended they obtain blood smear and fax those results to our clinic. Wonder if this is reactive thrombocytosis given recent history of hip fracture and bout with shingles. May need a myeloproliferative workup at follow-up. Can start low-dose aspirin over the weekend to lessen thrombotic risk.

## 2022-10-16 NOTE — Telephone Encounter (Signed)
Kim Anthony, she has a history of breast cancer, tobacco use, recent COPD exacerbation, and recent hip fracture. High risk history. I think we need to evaluate her in the clinic. Preferably get a cbc with diff and smear and make sure nothing else is going on to cause this

## 2022-10-16 NOTE — Telephone Encounter (Signed)
SNF [facility] RN Lowella Bandy called reporting FS POC INR of 5.8 resultant from the dosing as shown Su-7.5mg M-7.5mg T-5mg W-5mg Th-5mg . Advised RN to OMIT doses of warfarin today [id number], tomorrow [phone] and on Sunday 28APR24, administer 5mg  warfarin. Additionally, she reported and provided an image of the patient's PLTC showing "CH 1008 K/microLiter." I advised that the onsite/on-call provider for [facility] be apprised of the value and recommended a repeat of both a FS POC INR AND a venous drawn PT/INR tomorrow morning 27-APR-24 to provide [place] the following information: Is the FS POC INR correlating with the venous sample? Typically, the FS POC values will be less than the venous sample results. Additionally we will look to see if the INR will trend down after the omitted dose tonight. A repeat PLCT by the facility provider will provide additional information. Call received to my cell phone at home at 4:44 PM [phone].

## 2022-10-16 NOTE — Telephone Encounter (Signed)
Received call from Nedra Hai, NP at [facility], reporting critical platelets > 1000. Previous platelets were 770 on 10/08/22. Nedra Hai is managing, but she wanted to let [name] know as he is managing her INR. States patient was seen in ED last week for shingles and has completed round of Valacyclovir. Thinks increased platelets could be r/t this. She will fax all the results to [place] now.

## 2022-10-17 ENCOUNTER — Telehealth: Payer: Self-pay | Admitting: Pharmacist

## 2022-10-17 NOTE — Telephone Encounter (Signed)
Nurse calls with POC FS INR today (after omitting yesterdays dose) = 5.5 (down from 5.8 yesterday). No bleeding. No new medications/antibiotics. Will OMIT today's dose, repeat INR tomorrow.

## 2022-10-18 ENCOUNTER — Telehealth: Payer: Self-pay | Admitting: Pharmacist

## 2022-10-18 NOTE — Telephone Encounter (Signed)
SNF RN calls with POC FS INR 4.8 after successive days of holding warfarin. Will recommence today with 5mg  warfarin so as to avoid her being without warfarin so long that she reverts to baseline. Repeat INR tomorrow. No bleeding per his asessment.

## 2022-10-19 ENCOUNTER — Telehealth: Payer: Self-pay | Admitting: Pharmacist

## 2022-10-19 NOTE — Telephone Encounter (Signed)
SNF RN calls reporting results of FS POC INR = 5.00 (up from 4.8 yesterday)--after reinitiating warfarin with only 5 milligrams yesterday to avoid holding too many days and falling subtherapeutic in setting of mechanical prosthetic heart valve indication. RN states there are no "new" medications. Patient remains on amidodarone (commenced by cardiology after discharge from the hospital to the SNF), this typically requires a 30 - 50% reduction in warfarin total weekly dosing. Presently, reacting to daily INR results due to the volatility of her INR response primarily characterized as a hypoprothrombinemic response. Inquired about current nutritional intake, RN states she is taking PO diet. Last inpatient admission, she was slightly hypoalbunemic.  Will OMIT today's dose (Monday, October 19, 2022) Will OMIT tomorrow's dose (Tuesday, October 20, 2022) Will repeat FS POC INR on Wednesday, Oct 21, 2022.

## 2022-10-19 NOTE — Telephone Encounter (Signed)
Evaluation and management procedures were performed by the Clinical Pharmacy Practitioner under my supervision and collaboration. I have reviewed the Practitioner's note and chart, and I agree with the management and plan as documented above. ° °

## 2022-10-21 ENCOUNTER — Telehealth: Payer: Self-pay | Admitting: *Deleted

## 2022-10-21 NOTE — Telephone Encounter (Signed)
She needs an appointment with Kim Anthony- if she' sin a facility- we cnanot refill her pain meds- she can get when she gets out, I would think?- ML

## 2022-10-21 NOTE — Telephone Encounter (Signed)
Suspect >8.0 INR. SNF contacted me to report that 2 successive attempts at [facility] POC were unsuccessful. Attempted venipuncture INR unsuccessful. Will administer 2.5mg  vitamin K1 PO (using the in-house, parenteral fluid dispersed in orange juice.) INR FS POC repeat INR on Thursday, Oct 22, 2022. Will omit doses on Wednesday 1-MAY and Thursday 2-MAY. Dose will have bas been held since Monday (3 doses omitted.)

## 2022-10-21 NOTE — Telephone Encounter (Signed)
Requesting a refill on her hydrocodone. She mentioned she is staying in some kind of facility(rehab?/snf?))

## 2022-10-22 MED ORDER — HYDROCODONE-ACETAMINOPHEN 5-325 MG PO TABS: 1.0000 | ORAL_TABLET | ORAL | 0 refills | Status: DC | PRN

## 2022-10-22 NOTE — Telephone Encounter (Signed)
Patient called and said she is getting out the facility on Saturday and she needs a refill on Hydrocodone/APAP

## 2022-10-22 NOTE — Telephone Encounter (Signed)
I spoke to her daughter [name]. Mannat is currently in [facility] but is being discharged Saturday.

## 2022-10-22 NOTE — Addendum Note (Signed)
Addended by: Genice Rouge on: 10/22/2022 02:09 PM   Modules accepted: Orders

## 2022-10-26 ENCOUNTER — Telehealth: Payer: Self-pay | Admitting: Orthopaedic Surgery

## 2022-10-26 NOTE — Telephone Encounter (Signed)
Received call from Kreg Shropshire (PT) with [facility] needing verbal orders for HHPT 2 Wk 6 and 1 Wk 3.   The number to contact Jae Dire is [phone]

## 2022-10-26 NOTE — Telephone Encounter (Signed)
Called and [facility] for [place] with verbal OK.

## 2022-10-26 NOTE — Telephone Encounter (Signed)
FS, POC, PST INR value today is 1.5 (after discharge from [facility] on Saturday. Received 7.5mg  on Friday 3MAY24, no warfarin on Sat/Sun. On prednisone (DDI w warfarin) will give 5mg  warfarin today, tomorrow and Wednesday. Repeat INR 9MAY24.

## 2022-10-28 ENCOUNTER — Telehealth: Payer: Self-pay | Admitting: Orthopaedic Surgery

## 2022-10-28 NOTE — Telephone Encounter (Signed)
Moira called. She is the OT working with patientg in home. She would like verbal orders for OT 1x wk for 9 wks. Her call back number is [phone]

## 2022-10-28 NOTE — Telephone Encounter (Signed)
Called and gave verbal okay.  

## 2022-11-03 ENCOUNTER — Encounter: Payer: 59 | Admitting: Orthopaedic Surgery

## 2022-11-05 ENCOUNTER — Ambulatory Visit (INDEPENDENT_AMBULATORY_CARE_PROVIDER_SITE_OTHER): Payer: 59 | Admitting: Physician Assistant

## 2022-11-05 ENCOUNTER — Other Ambulatory Visit (INDEPENDENT_AMBULATORY_CARE_PROVIDER_SITE_OTHER): Payer: 59

## 2022-11-05 ENCOUNTER — Encounter: Payer: Self-pay | Admitting: Physician Assistant

## 2022-11-05 ENCOUNTER — Telehealth: Payer: Self-pay | Admitting: *Deleted

## 2022-11-05 DIAGNOSIS — S72141A Displaced intertrochanteric fracture of right femur, initial encounter for closed fracture: Secondary | ICD-10-CM

## 2022-11-05 NOTE — Telephone Encounter (Signed)
Received INR results from [facility].  INR 1.5 - on 10/25/22@7 :26 AM  INR 2.7 - on 11/05/22@ 7:25 AM Thanks

## 2022-11-05 NOTE — Progress Notes (Signed)
Post-Op Visit Note   Patient: Kim Anthony           Date of Birth: 1951-05-06           [name]: [id number] Visit Date: 11/05/2022 PCP: Willette Cluster, MD   Assessment & Plan:  Chief Complaint:  Chief Complaint  Patient presents with   Right Hip - Follow-up    IM nailing 09/23/2022   Visit Diagnoses:  1. Closed intertrochanteric fracture of right femur, initial encounter [facility])     Plan: Patient is a pleasant 72 year old female who comes in today approximately 6 weeks status post right hip IM nail 09/23/2022.  She has been doing well.  Pain has improved quite a bit and is only mild at this point.  She has been getting home health physical therapy and is ambulating with a walker.  She was ambulating unassisted prior to her fall.  Examination of the right hip reveals fully healed surgical scars without complication.  She is able to slightly hip flex.  She is neurovascularly intact distally.  At this point, she will continue with home health physical therapy.  She will follow-up with [place] in 6 weeks for repeat evaluation and right femur x-rays.  Call with concerns or questions in the meantime.  Follow-Up Instructions: Return in about 6 weeks (around 12/17/2022).   Orders:  Orders Placed This Encounter  Procedures   XR FEMUR, MIN 2 VIEWS RIGHT   No orders of the defined types were placed in this encounter.   Imaging: XR FEMUR, MIN 2 VIEWS RIGHT  Result Date: 11/05/2022 X-rays demonstrate stable alignment of the fracture without hardware complication   PMFS History: Patient Active Problem List   Diagnosis Date Noted   Thrombocytosis 10/16/2022   Hyperkalemia 10/08/2022   AKI (acute kidney injury) (HCC) 10/07/2022   Alteration in anticoagulation 09/25/2022   History of mitral valve replacement with mechanical valve 09/24/2022   Anticoagulated 09/24/2022   Closed fracture of right hip (HCC) 09/23/2022   Closed intertrochanteric fracture of right femur, initial encounter (HCC)  09/22/2022   H/O mitral valve replacement with mechanical valve 09/22/2022   Chronic HFrEF (heart failure with reduced ejection fraction) (HCC) 09/22/2022   COVID-19 virus infection 08/19/2022   Endometrial thickening on ultrasound 07/07/2022   Gastroesophageal reflux disease without esophagitis 06/29/2022   Chronic pain of both knees 06/29/2022   Vaginal bleeding 04/01/2022   A-fib (HCC) 03/13/2022   Facial edema 03/07/2022   Atypical atrial flutter (HCC)    Combined systolic and diastolic heart failure (HCC) 02/19/2022   CHF (congestive heart failure) (HCC) 02/19/2022   Combined systolic and diastolic congestive heart failure (HCC) 02/18/2022   Acute pulmonary edema (HCC)    Edema of both lower extremities 01/13/2022   Hypotension 01/13/2022   Subacute cough 11/28/2021   COPD exacerbation (HCC) 11/05/2021   Conjunctival hemorrhage of left eye 06/25/2021   Osteoarthritis of left knee 04/24/2021   Seronegative inflammatory arthritis 02/17/2021   High risk medication use 02/17/2021   Elevated transaminase level 01/27/2021   Acute idiopathic gout of multiple sites 09/30/2020   Chronic pain syndrome 01/17/2020   Monoarthritis of wrist 10/18/2019   Ganglion cyst 09/21/2019   Hip pain 08/23/2019   Thoracic ascending aortic aneurysm (HCC) 07/05/2019   Macrocytic anemia 02/16/2019   Breast cancer (HCC)    Acquired absence of left breast 12/20/2018   COPD (chronic obstructive pulmonary disease) (HCC) 11/25/2017   S/P MVR (mitral valve replacement)    Chronic anticoagulation  Tobacco use disorder 02/25/2017   Lymphadenopathy, mediastinal 02/25/2017   Essential hypertension 02/01/2016   Allergic rhinitis 02/01/2016   Healthcare maintenance 01/30/2016   Chronic low back pain 07/22/2015   Pulmonary embolism (HCC) 07/11/2015   Chronic combined systolic and diastolic heart failure (HCC) 07/11/2015   Headache, common migraine 07/11/2015   HLD (hyperlipidemia) 07/11/2015   Past  Medical History:  Diagnosis Date   Arthritis    Asthma    Breast cancer (HCC) 2001   Left Breast Cancer   CHF (congestive heart failure) (HCC)    COPD (chronic obstructive pulmonary disease) (HCC)    Coronary artery disease    GERD (gastroesophageal reflux disease)    History of mitral valve replacement with mechanical valve    HLD (hyperlipidemia)    Hypertension    Pulmonary embolism (HCC) 2021    Family History  Problem Relation Age of Onset   Hypertension Mother    Cancer Father    Hypertension Father    Breast cancer Maternal Aunt 50   Heart attack Other     Past Surgical History:  Procedure Laterality Date   APPLICATION OF A-CELL OF CHEST/ABDOMEN Left 01/27/2019   Procedure: APPLICATION OF A-CELL OF CHEST;  Surgeon: Peggye Form, DO;  [facility]: [facility] OR;  Service: Plastics;  Laterality: Left;   APPLICATION OF WOUND VAC N/A 01/27/2019   Procedure: APPLICATION OF WOUND VAC;  Surgeon: Peggye Form, DO;  Location: [facility] OR;  Service: Plastics;  Laterality: N/A;   BREAST SURGERY Left 2001   for CA   CARDIOVERSION N/A 02/26/2022   Procedure: CARDIOVERSION;  Surgeon: Little Ishikawa, MD;  Location: [facility] ENDOSCOPY;  Service: Cardiovascular;  Laterality: N/A;   COLONOSCOPY WITH PROPOFOL N/A 06/07/2018   Procedure: COLONOSCOPY WITH PROPOFOL;  Surgeon: Charlott Rakes, MD;  Location: [facility] ENDOSCOPY;  Service: Endoscopy;  Laterality: N/A;   DEBRIDEMENT AND CLOSURE WOUND Left 12/28/2018   Procedure: Debridement And Closure Wound;  Surgeon: Abigail Miyamoto, MD;  Location: [facility] OR;  Service: General;  Laterality: Left;   INCISION AND DRAINAGE OF WOUND Left 01/27/2019   Procedure: IRRIGATION AND DEBRIDEMENT OF CHEST WALL;  Surgeon: Peggye Form, DO;  [facility]: [facility] OR;  Service: Plastics;  Laterality: Left;   INTRAMEDULLARY (IM) NAIL INTERTROCHANTERIC N/A 09/23/2022   Procedure: INTRAMEDULLARY (IM) NAIL INTERTROCHANTERIC;  Surgeon: Tarry Kos, MD;  Location: [facility]  OR;  Service: Orthopedics;  Laterality: N/A;   KNEE ARTHROSCOPY WITH MEDIAL MENISECTOMY Left 08/06/2021   Procedure: KNEE ARTHROSCOPY WITH PARTIAL MEDIAL MENISECTOMY, DEBRIDEMENT;  Surgeon: Jene Every, MD;  Location: [facility] ORS;  Service: Orthopedics;  Laterality: Left;  1 HR   LATISSIMUS FLAP TO BREAST Left 01/18/2019   Procedure: LEFT LATISSIMUS FLAP TO BREAST;  Surgeon: Peggye Form, DO;  Location: [facility] OR;  Service: Plastics;  Laterality: Left;   MASTECTOMY Left    MITRAL VALVE REPLACEMENT     mechanical   POLYPECTOMY  06/07/2018   Procedure: POLYPECTOMY;  Surgeon: Charlott Rakes, MD;  Location: [facility] ENDOSCOPY;  Service: Endoscopy;;   RIGHT/LEFT HEART CATH AND CORONARY ANGIOGRAPHY N/A 01/09/2021   Procedure: RIGHT/LEFT HEART CATH AND CORONARY ANGIOGRAPHY;  Surgeon: Dolores Patty, MD;  [facility] INVASIVE CV LAB;  Service: Cardiovascular;  Laterality: N/A;   WOUND DEBRIDEMENT Left 12/28/2018   Procedure: EXCISION OF LEFT CHRONIC CHEST WALL WOUND;  Surgeon: Abigail Miyamoto, MD;  Location: [facility] OR;  Service: General;  Laterality: Left;   Social History   Occupational History   Not on  file  Tobacco Use   Smoking status: Every Day    Packs/day: 0.10    Years: 30.00    Additional pack years: 0.00    Total pack years: 3.00    Types: Cigarettes   Smokeless tobacco: Never   Tobacco comments:    5 cigs per day  Vaping Use   Vaping Use: Never used  Substance and Sexual Activity   Alcohol use: Yes    Alcohol/week: 3.0 standard drinks of alcohol    Types: 3 Standard drinks or equivalent per week    Comment: beer- ocasional   Drug use: No   Sexual activity: Not Currently    Birth control/protection: None

## 2022-11-10 ENCOUNTER — Ambulatory Visit (INDEPENDENT_AMBULATORY_CARE_PROVIDER_SITE_OTHER): Payer: 59 | Admitting: Physician Assistant

## 2022-11-10 DIAGNOSIS — S72141A Displaced intertrochanteric fracture of right femur, initial encounter for closed fracture: Secondary | ICD-10-CM

## 2022-11-10 NOTE — Progress Notes (Signed)
Post-Op Visit Note   Patient: Kim Anthony           Date of Birth: [birth year]           [name]: [id number] Visit Date: 11/10/2022 PCP: Willette Cluster, MD   Assessment & Plan:  Chief Complaint:  Chief Complaint  Patient presents with   Right Leg - Pain   Visit Diagnoses:  1. Closed intertrochanteric fracture of right femur, initial encounter [facility])     Plan: Patient is a pleasant 72 year old female who comes in today approximately 7 weeks status post right hip IM nail 09/23/2022.  She initially had a large area of swelling to the proximal incision following surgery which significantly improved over time.  Of note, she is on warfarin.  When I saw her last week, she was doing well with very minimal swelling and pain to the lateral hip.  She notes that she had physical therapy that afternoon and woke up Friday and a fair amount of pain.  All of her pain is to the lateral aspect.  She feels as though the swelling has reaccumulated to the proximal incision.  No fevers or chills.  Examination of her right hip reveals pain as well as an area of induration that is also tender to the proximal incision.  The incision itself is fully healed.  No evidence of erythema or cellulitis.  Minimal pain with logroll.  She is neurovascular intact distally.  At this point, the patient will continue to progress with activity.  Continue with PT.  She is currently in pain management and will reach out to them for further narcotic pain medication refills.  Follow-up with [place] in 5 weeks at her regularly scheduled appointment for repeat evaluation and right femur x-rays.  Follow-Up Instructions: Return in about 5 weeks (around 12/15/2022) for for her already scheduled appointment.   Orders:  No orders of the defined types were placed in this encounter.  No orders of the defined types were placed in this encounter.   Imaging: No new imaging  PMFS History: Patient Active Problem List   Diagnosis Date Noted    Thrombocytosis 10/16/2022   Hyperkalemia 10/08/2022   AKI (acute kidney injury) (HCC) 10/07/2022   Alteration in anticoagulation 09/25/2022   History of mitral valve replacement with mechanical valve 09/24/2022   Anticoagulated 09/24/2022   Closed fracture of right hip (HCC) 09/23/2022   Closed intertrochanteric fracture of right femur, initial encounter (HCC) 09/22/2022   H/O mitral valve replacement with mechanical valve 09/22/2022   Chronic HFrEF (heart failure with reduced ejection fraction) (HCC) 09/22/2022   COVID-19 virus infection 08/19/2022   Endometrial thickening on ultrasound 07/07/2022   Gastroesophageal reflux disease without esophagitis 06/29/2022   Chronic pain of both knees 06/29/2022   Vaginal bleeding 04/01/2022   A-fib (HCC) 03/13/2022   Facial edema 03/07/2022   Atypical atrial flutter (HCC)    Combined systolic and diastolic heart failure (HCC) 02/19/2022   CHF (congestive heart failure) (HCC) 02/19/2022   Combined systolic and diastolic congestive heart failure (HCC) 02/18/2022   Acute pulmonary edema (HCC)    Edema of both lower extremities 01/13/2022   Hypotension 01/13/2022   Subacute cough 11/28/2021   COPD exacerbation (HCC) 11/05/2021   Conjunctival hemorrhage of left eye 06/25/2021   Osteoarthritis of left knee 04/24/2021   Seronegative inflammatory arthritis 02/17/2021   High risk medication use 02/17/2021   Elevated transaminase level 01/27/2021   Acute idiopathic gout of multiple sites 09/30/2020   Chronic  pain syndrome 01/17/2020   Monoarthritis of wrist 10/18/2019   Ganglion cyst 09/21/2019   Hip pain 08/23/2019   Thoracic ascending aortic aneurysm (HCC) 07/05/2019   Macrocytic anemia 02/16/2019   Breast cancer (HCC)    Acquired absence of left breast 12/20/2018   COPD (chronic obstructive pulmonary disease) (HCC) 11/25/2017   S/P MVR (mitral valve replacement)    Chronic anticoagulation    Tobacco use disorder 02/25/2017    Lymphadenopathy, mediastinal 02/25/2017   Essential hypertension 02/01/2016   Allergic rhinitis 02/01/2016   Healthcare maintenance 01/30/2016   Chronic low back pain 07/22/2015   Pulmonary embolism (HCC) 07/11/2015   Chronic combined systolic and diastolic heart failure (HCC) 07/11/2015   Headache, common migraine 07/11/2015   HLD (hyperlipidemia) 07/11/2015   Past Medical History:  Diagnosis Date   Arthritis    Asthma    Breast cancer (HCC) 2001   Left Breast Cancer   CHF (congestive heart failure) (HCC)    COPD (chronic obstructive pulmonary disease) (HCC)    Coronary artery disease    GERD (gastroesophageal reflux disease)    History of mitral valve replacement with mechanical valve    HLD (hyperlipidemia)    Hypertension    Pulmonary embolism (HCC) 2021    Family History  Problem Relation Age of Onset   Hypertension Mother    Cancer Father    Hypertension Father    Breast cancer Maternal Aunt 50   Heart attack Other     Past Surgical History:  Procedure Laterality Date   APPLICATION OF A-CELL OF CHEST/ABDOMEN Left 01/27/2019   Procedure: APPLICATION OF A-CELL OF CHEST;  Surgeon: Peggye Form, DO;  Location: [facility] OR;  Service: Plastics;  Laterality: Left;   APPLICATION OF WOUND VAC N/A 01/27/2019   Procedure: APPLICATION OF WOUND VAC;  Surgeon: Peggye Form, DO;  Location: [facility] OR;  Service: Plastics;  Laterality: N/A;   BREAST SURGERY Left 2001   for CA   CARDIOVERSION N/A 02/26/2022   Procedure: CARDIOVERSION;  Surgeon: Little Ishikawa, MD;  Location: [facility] ENDOSCOPY;  Service: Cardiovascular;  Laterality: N/A;   COLONOSCOPY WITH PROPOFOL N/A 06/07/2018   Procedure: COLONOSCOPY WITH PROPOFOL;  Surgeon: Charlott Rakes, MD;  Location: [facility] ENDOSCOPY;  Service: Endoscopy;  Laterality: N/A;   DEBRIDEMENT AND CLOSURE WOUND Left 12/28/2018   Procedure: Debridement And Closure Wound;  Surgeon: Abigail Miyamoto, MD;  Location: [facility] OR;  Service: General;   Laterality: Left;   INCISION AND DRAINAGE OF WOUND Left 01/27/2019   Procedure: IRRIGATION AND DEBRIDEMENT OF CHEST WALL;  Surgeon: Peggye Form, DO;  Location: [facility] OR;  Service: Plastics;  Laterality: Left;   INTRAMEDULLARY (IM) NAIL INTERTROCHANTERIC N/A 09/23/2022   Procedure: INTRAMEDULLARY (IM) NAIL INTERTROCHANTERIC;  Surgeon: Tarry Kos, MD;  Location: [facility] OR;  Service: Orthopedics;  Laterality: N/A;   KNEE ARTHROSCOPY WITH MEDIAL MENISECTOMY Left 08/06/2021   Procedure: KNEE ARTHROSCOPY WITH PARTIAL MEDIAL MENISECTOMY, DEBRIDEMENT;  Surgeon: Jene Every, MD;  Location: [facility] ORS;  Service: Orthopedics;  Laterality: Left;  1 HR   LATISSIMUS FLAP TO BREAST Left 01/18/2019   Procedure: LEFT LATISSIMUS FLAP TO BREAST;  Surgeon: Peggye Form, DO;  Location: [facility] OR;  Service: Plastics;  Laterality: Left;   MASTECTOMY Left    MITRAL VALVE REPLACEMENT     mechanical   POLYPECTOMY  06/07/2018   Procedure: POLYPECTOMY;  Surgeon: Charlott Rakes, MD;  Location: [facility] ENDOSCOPY;  Service: Endoscopy;;   RIGHT/LEFT HEART CATH AND CORONARY ANGIOGRAPHY N/A 01/09/2021  Procedure: RIGHT/LEFT HEART CATH AND CORONARY ANGIOGRAPHY;  Surgeon: Dolores Patty, MD;  Location: [facility] INVASIVE CV LAB;  Service: Cardiovascular;  Laterality: N/A;   WOUND DEBRIDEMENT Left 12/28/2018   Procedure: EXCISION OF LEFT CHRONIC CHEST WALL WOUND;  Surgeon: Abigail Miyamoto, MD;  Location: [facility] OR;  Service: General;  Laterality: Left;   Social History   Occupational History   Not on file  Tobacco Use   Smoking status: Every Day    Packs/day: 0.10    Years: 30.00    Additional pack years: 0.00    Total pack years: 3.00    Types: Cigarettes   Smokeless tobacco: Never   Tobacco comments:    5 cigs per day  Vaping Use   Vaping Use: Never used  Substance and Sexual Activity   Alcohol use: Yes    Alcohol/week: 3.0 standard drinks of alcohol    Types: 3 Standard drinks or equivalent per week     Comment: beer- ocasional   Drug use: No   Sexual activity: Not Currently    Birth control/protection: None

## 2022-11-11 ENCOUNTER — Telehealth: Payer: Self-pay

## 2022-11-11 MED ORDER — HYDROCODONE-ACETAMINOPHEN 10-325 MG PO TABS: 1.0000 | ORAL_TABLET | ORAL | 0 refills | Status: DC | PRN

## 2022-11-11 NOTE — Telephone Encounter (Signed)
Patient informed. 

## 2022-11-11 NOTE — Telephone Encounter (Signed)
Patient stated she about #20 Hydrocodone-Ace 5-325 MG on hand.   She is doubling up on the tabs after right hip surgery 09/23/2022. Patient stated Dr. Roda Shutters advised her to call you about the pain. Maybe you will up the dosage to 10 MG?.   Call back phone [phone].   Filled  Written  ID  Drug  QTY  Days  Prescriber  RX #  Dispenser  Refill  Daily Dose*  Pymt Type  PMP  10/24/2022 10/24/2022 2  Gabapentin 300 Mg Capsule 90.00 30 Kh Bla [id number] [facility] ([id number]) 0/0  Medicare [place] 10/22/2022 10/22/2022 2  Hydrocodone-Acetamin 5-325 Mg 150.00 25 Me Lov [id number] [facility] ([id number]) 0/0 30.00 MME Medicare [place] 09/16/2022 07/03/2022 2  Gabapentin 600 Mg Tablet 90.00 20 Ty Rog [id number] [facility] ([id number]) 0/0 3.02 LME Medicare [place]

## 2022-11-17 ENCOUNTER — Emergency Department (HOSPITAL_COMMUNITY): Payer: 59

## 2022-11-17 ENCOUNTER — Observation Stay (HOSPITAL_COMMUNITY)
Admission: EM | Admit: 2022-11-17 | Discharge: 2022-11-18 | Disposition: A | Discharge status: Home / Self Care | Payer: 59 | Attending: Internal Medicine | Admitting: Internal Medicine

## 2022-11-17 ENCOUNTER — Other Ambulatory Visit: Payer: Self-pay

## 2022-11-17 ENCOUNTER — Encounter (HOSPITAL_COMMUNITY): Payer: Self-pay

## 2022-11-17 DIAGNOSIS — F17210 Nicotine dependence, cigarettes, uncomplicated: Secondary | ICD-10-CM | POA: Insufficient documentation

## 2022-11-17 DIAGNOSIS — I110 Hypertensive heart disease with heart failure: Secondary | ICD-10-CM | POA: Diagnosis not present

## 2022-11-17 DIAGNOSIS — Z853 Personal history of malignant neoplasm of breast: Secondary | ICD-10-CM | POA: Diagnosis not present

## 2022-11-17 DIAGNOSIS — Z86711 Personal history of pulmonary embolism: Secondary | ICD-10-CM | POA: Diagnosis not present

## 2022-11-17 DIAGNOSIS — I4891 Unspecified atrial fibrillation: Secondary | ICD-10-CM | POA: Insufficient documentation

## 2022-11-17 DIAGNOSIS — Z7901 Long term (current) use of anticoagulants: Secondary | ICD-10-CM | POA: Diagnosis not present

## 2022-11-17 DIAGNOSIS — I5040 Unspecified combined systolic (congestive) and diastolic (congestive) heart failure: Secondary | ICD-10-CM | POA: Insufficient documentation

## 2022-11-17 DIAGNOSIS — G894 Chronic pain syndrome: Secondary | ICD-10-CM

## 2022-11-17 DIAGNOSIS — I2510 Atherosclerotic heart disease of native coronary artery without angina pectoris: Secondary | ICD-10-CM | POA: Diagnosis not present

## 2022-11-17 DIAGNOSIS — J45909 Unspecified asthma, uncomplicated: Secondary | ICD-10-CM | POA: Insufficient documentation

## 2022-11-17 DIAGNOSIS — J441 Chronic obstructive pulmonary disease with (acute) exacerbation: Principal | ICD-10-CM | POA: Insufficient documentation

## 2022-11-17 DIAGNOSIS — R0602 Shortness of breath: Secondary | ICD-10-CM | POA: Diagnosis present

## 2022-11-17 DIAGNOSIS — I5022 Chronic systolic (congestive) heart failure: Secondary | ICD-10-CM

## 2022-11-17 DIAGNOSIS — E876 Hypokalemia: Secondary | ICD-10-CM | POA: Diagnosis not present

## 2022-11-17 DIAGNOSIS — J449 Chronic obstructive pulmonary disease, unspecified: Secondary | ICD-10-CM

## 2022-11-17 DIAGNOSIS — Z1152 Encounter for screening for COVID-19: Secondary | ICD-10-CM | POA: Diagnosis not present

## 2022-11-17 DIAGNOSIS — Z79899 Other long term (current) drug therapy: Secondary | ICD-10-CM | POA: Insufficient documentation

## 2022-11-17 DIAGNOSIS — M545 Low back pain, unspecified: Secondary | ICD-10-CM

## 2022-11-17 DIAGNOSIS — Z8616 Personal history of COVID-19: Secondary | ICD-10-CM | POA: Diagnosis not present

## 2022-11-17 DIAGNOSIS — I5042 Chronic combined systolic (congestive) and diastolic (congestive) heart failure: Secondary | ICD-10-CM

## 2022-11-17 LAB — I-STAT VENOUS BLOOD GAS, ED
Acid-Base Excess: 7 mmol/L — ABNORMAL HIGH (ref 0.0–2.0)
Bicarbonate: 31.9 mmol/L — ABNORMAL HIGH (ref 20.0–28.0)
Calcium, Ion: 1.07 mmol/L — ABNORMAL LOW (ref 1.15–1.40)
HCT: 40 % (ref 36.0–46.0)
Hemoglobin: 13.6 g/dL (ref 12.0–15.0)
O2 Saturation: 99 %
Potassium: 5.1 mmol/L (ref 3.5–5.1)
Sodium: 140 mmol/L (ref 135–145)
TCO2: 33 mmol/L — ABNORMAL HIGH (ref 22–32)
pCO2, Ven: 43.2 mmHg — ABNORMAL LOW (ref 44–60)
pH, Ven: 7.477 — ABNORMAL HIGH (ref 7.25–7.43)
pO2, Ven: 128 mmHg — ABNORMAL HIGH (ref 32–45)

## 2022-11-17 LAB — CBC WITH DIFFERENTIAL/PLATELET
Abs Immature Granulocytes: 0.01 10*3/uL (ref 0.00–0.07)
Basophils Absolute: 0.1 10*3/uL (ref 0.0–0.1)
Basophils Relative: 1 %
Eosinophils Absolute: 0.1 10*3/uL (ref 0.0–0.5)
Eosinophils Relative: 1 %
HCT: 38 % (ref 36.0–46.0)
Hemoglobin: 12.3 g/dL (ref 12.0–15.0)
Immature Granulocytes: 0 %
Lymphocytes Relative: 16 %
Lymphs Abs: 1.3 10*3/uL (ref 0.7–4.0)
MCH: 33.2 pg (ref 26.0–34.0)
MCHC: 32.4 g/dL (ref 30.0–36.0)
MCV: 102.4 fL — ABNORMAL HIGH (ref 80.0–100.0)
Monocytes Absolute: 0.7 10*3/uL (ref 0.1–1.0)
Monocytes Relative: 9 %
Neutro Abs: 5.7 10*3/uL (ref 1.7–7.7)
Neutrophils Relative %: 73 %
Platelets: 454 10*3/uL — ABNORMAL HIGH (ref 150–400)
RBC: 3.71 MIL/uL — ABNORMAL LOW (ref 3.87–5.11)
RDW: 18.2 % — ABNORMAL HIGH (ref 11.5–15.5)
WBC: 7.8 10*3/uL (ref 4.0–10.5)
nRBC: 0 % (ref 0.0–0.2)

## 2022-11-17 LAB — COMPREHENSIVE METABOLIC PANEL
ALT: 13 U/L (ref 0–44)
AST: 32 U/L (ref 15–41)
Albumin: 3.1 g/dL — ABNORMAL LOW (ref 3.5–5.0)
Alkaline Phosphatase: 140 U/L — ABNORMAL HIGH (ref 38–126)
Anion gap: 13 (ref 5–15)
BUN: <5 mg/dL — ABNORMAL LOW (ref 8–23)
CO2: 24 mmol/L (ref 22–32)
Calcium: 9.1 mg/dL (ref 8.9–10.3)
Chloride: 101 mmol/L (ref 98–111)
Creatinine, Ser: 0.76 mg/dL (ref 0.44–1.00)
GFR, Estimated: >60 mL/min (ref >60)
Glucose, Bld: 160 mg/dL — ABNORMAL HIGH (ref 70–99)
Potassium: 2.3 mmol/L — CL (ref 3.5–5.1)
Sodium: 138 mmol/L (ref 135–145)
Total Bilirubin: 0.8 mg/dL (ref 0.3–1.2)
Total Protein: 7.2 g/dL (ref 6.5–8.1)

## 2022-11-17 LAB — BASIC METABOLIC PANEL
Anion gap: 15 (ref 5–15)
BUN: <5 mg/dL — ABNORMAL LOW (ref 8–23)
CO2: 25 mmol/L (ref 22–32)
Calcium: 9.6 mg/dL (ref 8.9–10.3)
Chloride: 99 mmol/L (ref 98–111)
Creatinine, Ser: 0.79 mg/dL (ref 0.44–1.00)
GFR, Estimated: >60 mL/min (ref >60)
Glucose, Bld: 136 mg/dL — ABNORMAL HIGH (ref 70–99)
Potassium: 3.8 mmol/L (ref 3.5–5.1)
Sodium: 139 mmol/L (ref 135–145)

## 2022-11-17 LAB — MAGNESIUM: Magnesium: 2.1 mg/dL (ref 1.7–2.4)

## 2022-11-17 LAB — BRAIN NATRIURETIC PEPTIDE: B Natriuretic Peptide: 113.5 pg/mL — ABNORMAL HIGH (ref 0.0–100.0)

## 2022-11-17 LAB — SARS CORONAVIRUS 2 BY RT PCR: SARS Coronavirus 2 by RT PCR: NEGATIVE

## 2022-11-17 LAB — TROPONIN I (HIGH SENSITIVITY): Troponin I (High Sensitivity): 9 ng/L (ref <18)

## 2022-11-17 LAB — PROTIME-INR
INR: 3.3 — ABNORMAL HIGH (ref 0.8–1.2)
Prothrombin Time: 34 seconds — ABNORMAL HIGH (ref 11.4–15.2)

## 2022-11-17 MED ORDER — PREDNISONE 20 MG PO TABS
40.0000 mg | ORAL_TABLET | Freq: Every day | ORAL | Status: DC
  Administered 2022-11-18: 40 mg via ORAL
  Filled 2022-11-17: qty 2

## 2022-11-17 MED ORDER — WARFARIN - PHARMACIST DOSING INPATIENT: Freq: Every day | Status: DC

## 2022-11-17 MED ORDER — RIVAROXABAN 10 MG PO TABS: 10.0000 mg | ORAL_TABLET | Freq: Every day | ORAL | Status: DC

## 2022-11-17 MED ORDER — LOSARTAN POTASSIUM 25 MG PO TABS
12.5000 mg | ORAL_TABLET | Freq: Every day | ORAL | Status: DC
  Administered 2022-11-17 – 2022-11-18 (×2): 12.5 mg via ORAL
  Filled 2022-11-17 (×2): qty 1

## 2022-11-17 MED ORDER — POTASSIUM CHLORIDE 20 MEQ PO PACK: 40.0000 meq | PACK | Freq: Once | ORAL | Status: DC

## 2022-11-17 MED ORDER — FLUTICASONE FUROATE-VILANTEROL 100-25 MCG/ACT IN AEPB
1.0000 | INHALATION_SPRAY | Freq: Every day | RESPIRATORY_TRACT | Status: DC
  Administered 2022-11-18: 1 via RESPIRATORY_TRACT
  Filled 2022-11-17: qty 28

## 2022-11-17 MED ORDER — GABAPENTIN 300 MG PO CAPS
300.0000 mg | ORAL_CAPSULE | Freq: Three times a day (TID) | ORAL | Status: DC
  Administered 2022-11-17 – 2022-11-18 (×2): 300 mg via ORAL
  Filled 2022-11-17 (×2): qty 1

## 2022-11-17 MED ORDER — IPRATROPIUM-ALBUTEROL 0.5-2.5 (3) MG/3ML IN SOLN
3.0000 mL | Freq: Once | RESPIRATORY_TRACT | Status: AC
  Administered 2022-11-17: 3 mL via RESPIRATORY_TRACT
  Filled 2022-11-17: qty 3

## 2022-11-17 MED ORDER — IPRATROPIUM-ALBUTEROL 0.5-2.5 (3) MG/3ML IN SOLN
3.0000 mL | Freq: Four times a day (QID) | RESPIRATORY_TRACT | Status: AC
  Administered 2022-11-17 – 2022-11-18 (×2): 3 mL via RESPIRATORY_TRACT
  Filled 2022-11-17 (×3): qty 3

## 2022-11-17 MED ORDER — HYDROCODONE-ACETAMINOPHEN 10-325 MG PO TABS
1.0000 | ORAL_TABLET | Freq: Four times a day (QID) | ORAL | Status: DC | PRN
  Administered 2022-11-17 – 2022-11-18 (×3): 1 via ORAL
  Filled 2022-11-17 (×3): qty 1

## 2022-11-17 MED ORDER — MAGNESIUM SULFATE 2 GM/50ML IV SOLN
2.0000 g | Freq: Once | INTRAVENOUS | Status: AC
  Administered 2022-11-17: 2 g via INTRAVENOUS
  Filled 2022-11-17: qty 50

## 2022-11-17 MED ORDER — POTASSIUM CHLORIDE 10 MEQ/100ML IV SOLN
10.0000 meq | INTRAVENOUS | Status: DC
  Administered 2022-11-17: 10 meq via INTRAVENOUS
  Filled 2022-11-17 (×2): qty 100

## 2022-11-17 MED ORDER — POTASSIUM CHLORIDE CRYS ER 20 MEQ PO TBCR
20.0000 meq | EXTENDED_RELEASE_TABLET | Freq: Once | ORAL | Status: AC
  Administered 2022-11-17: 20 meq via ORAL
  Filled 2022-11-17: qty 1

## 2022-11-17 MED ORDER — NICOTINE 7 MG/24HR TD PT24
7.0000 mg | MEDICATED_PATCH | Freq: Every day | TRANSDERMAL | Status: DC
  Administered 2022-11-17 – 2022-11-18 (×2): 7 mg via TRANSDERMAL
  Filled 2022-11-17 (×2): qty 1

## 2022-11-17 MED ORDER — DAPAGLIFLOZIN PROPANEDIOL 10 MG PO TABS
10.0000 mg | ORAL_TABLET | Freq: Every day | ORAL | Status: DC
  Administered 2022-11-17 – 2022-11-18 (×2): 10 mg via ORAL
  Filled 2022-11-17 (×2): qty 1

## 2022-11-17 MED ORDER — DULOXETINE HCL 20 MG PO CPEP: 20.0000 mg | ORAL_CAPSULE | Freq: Every day | ORAL | Status: DC

## 2022-11-17 MED ORDER — TORSEMIDE 20 MG PO TABS
40.0000 mg | ORAL_TABLET | Freq: Every day | ORAL | Status: DC
  Administered 2022-11-17 – 2022-11-18 (×2): 40 mg via ORAL
  Filled 2022-11-17 (×2): qty 2

## 2022-11-17 MED ORDER — POTASSIUM CHLORIDE 10 MEQ/100ML IV SOLN: 10.0000 meq | INTRAVENOUS | Status: DC

## 2022-11-17 MED ORDER — PANTOPRAZOLE SODIUM 40 MG PO TBEC
40.0000 mg | DELAYED_RELEASE_TABLET | Freq: Every day | ORAL | Status: DC
  Administered 2022-11-17 – 2022-11-18 (×2): 40 mg via ORAL
  Filled 2022-11-17 (×2): qty 1

## 2022-11-17 MED ORDER — UMECLIDINIUM BROMIDE 62.5 MCG/ACT IN AEPB
1.0000 | INHALATION_SPRAY | Freq: Every day | RESPIRATORY_TRACT | Status: DC
  Administered 2022-11-18: 1 via RESPIRATORY_TRACT
  Filled 2022-11-17: qty 7

## 2022-11-17 MED ORDER — METOPROLOL SUCCINATE ER 25 MG PO TB24
25.0000 mg | ORAL_TABLET | Freq: Every day | ORAL | Status: DC
  Administered 2022-11-17 – 2022-11-18 (×2): 25 mg via ORAL
  Filled 2022-11-17 (×2): qty 1

## 2022-11-17 MED ORDER — POTASSIUM CHLORIDE 20 MEQ PO PACK
60.0000 meq | PACK | Freq: Once | ORAL | Status: AC
  Administered 2022-11-17: 60 meq via ORAL
  Filled 2022-11-17: qty 3

## 2022-11-17 MED ORDER — WARFARIN SODIUM 2.5 MG PO TABS
2.5000 mg | ORAL_TABLET | Freq: Once | ORAL | Status: AC
  Administered 2022-11-17: 2.5 mg via ORAL
  Filled 2022-11-17 (×2): qty 1

## 2022-11-17 MED ORDER — AZITHROMYCIN 250 MG PO TABS
500.0000 mg | ORAL_TABLET | Freq: Every day | ORAL | Status: DC
  Administered 2022-11-17 – 2022-11-18 (×2): 500 mg via ORAL
  Filled 2022-11-17 (×2): qty 2

## 2022-11-17 MED ORDER — POTASSIUM CHLORIDE IN NACL 20-0.9 MEQ/L-% IV SOLN
Freq: Once | INTRAVENOUS | Status: DC
  Filled 2022-11-17: qty 1000

## 2022-11-17 MED ORDER — SIMVASTATIN 20 MG PO TABS
20.0000 mg | ORAL_TABLET | Freq: Every day | ORAL | Status: DC
  Administered 2022-11-17 – 2022-11-18 (×2): 20 mg via ORAL
  Filled 2022-11-17 (×2): qty 1

## 2022-11-17 NOTE — ED Notes (Signed)
MD notified of K+ result

## 2022-11-17 NOTE — Hospital Course (Addendum)
[facility] patient  Presented with SOB  Increased work of breathing for last ???  Started feeling short of breath this morning.   She was outside a lot yesterday Cough started last night Runny nose Productive cough  Has been taking medications, her daughter  She was taken off K at rehab place. She has been out of rehab facility since 5/4. She's used albuterol more frequently.  166lbs She lost weight at rehab place.   Meds: Albuterol Amiodarone 200 mg Trlegy Gabapentin TID Hydrocone-tylenol 10-325 mg takes 4x Losartan 25 Metorolol 25mg  Pantoprazole 40 mg Simvastatin 20 Torsemide 40 mg Warfarin  Farxiga Duloxetine  5/29: Feels much better this morning. No trouble breathing, no chest pain or chest pressure. Cough is better. No fever, chills, other symptoms.

## 2022-11-17 NOTE — ED Notes (Signed)
Pt requesting another breathing treatment. Pt in NAD, respirations even and unlabored. MD aware.

## 2022-11-17 NOTE — Progress Notes (Addendum)
ANTICOAGULATION CONSULT NOTE - Initial Consult  Pharmacy Consult for warfarin Indication:  Mechanical mitral valve, Atrial fibrillation, Hx of PE  Allergies  Allergen Reactions   Lisinopril Swelling   Acetaminophen-Codeine Itching   Propoxyphene Itching    Darvocet   Tape Rash    Plastic   Tramadol Itching, Nausea And Vomiting and Rash    Patient Measurements: Height: 5\' 5"  (165.1 cm) Weight: 75.3 kg (166 lb) IBW/kg (Calculated) : 57  Vital Signs: Temp: 98.6 F (37 C) (05/28 1100) Temp Source: Oral (05/28 0640) BP: 137/75 (05/28 1515) Pulse Rate: 87 (05/28 1515)  Labs: Recent Labs    11/17/22 0646 11/17/22 0709 11/17/22 0722 11/17/22 1135  HGB 12.3 13.6  --   --   HCT 38.0 40.0  --   --   PLT 454*  --   --   --   LABPROT  --   --  34.0*  --   INR  --   --  3.3*  --   CREATININE  --   --   --  0.76  TROPONINIHS  --   --   --  9    Estimated Creatinine Clearance: 63.6 mL/min (by C-G formula based on SCr of 0.76 mg/dL).   Medical History: Past Medical History:  Diagnosis Date   Arthritis    Asthma    Breast cancer (HCC) 2001   Left Breast Cancer   CHF (congestive heart failure) (HCC)    COPD (chronic obstructive pulmonary disease) (HCC)    Coronary artery disease    GERD (gastroesophageal reflux disease)    History of mitral valve replacement with mechanical valve    HLD (hyperlipidemia)    Hypertension    Pulmonary embolism (HCC) 2021    Medications:  (Not in a hospital admission)  Scheduled:   azithromycin  500 mg Oral Daily   [START ON 11/18/2022] predniSONE  40 mg Oral Q breakfast   Infusions:   Assessment: Patient presented with shortness of breath x 2 days. PMH significant for mechanical MVR, atrial fibrillation, and history of PE. Patient reports last dose of warfarin was 11/14/22. INR on admission was therapeutic at 3.3 despite missing 2 doses of warfarin. PTA anticoagulation notes 10/19/22 and 10/21/22 reveal a complicated and labile recent  warfarin history requiring oral vitamin K.   PTA warfarin dose per patient report: 5 mg once daily  Hgb 13.6, HCT 40, and PLTc 454K. Given therapeutic INR despite recent missed doses and recent supratherapeutic INRs outpatient, will start with conservative dosing today. No bleeding. Pt being started on azithromycin.  Goal of Therapy:  INR 2.5-3.5 Monitor platelets by anticoagulation protocol: Yes   Plan:  Give warfarin 2.5 mg x 1 tonight Daily INR and CBC  Thank you for involving pharmacy in this patient's care.  Enos Fling, PharmD PGY2 Pharmacy Resident 11/17/2022 4:39 PM

## 2022-11-17 NOTE — ED Provider Notes (Signed)
[place] EMERGENCY DEPARTMENT AT [facility] Provider Note  CSN: [id number] Arrival date & time: 11/17/22 0630  Chief Complaint(s) Shortness of Breath (Patient to ED via EMS with complaint of shortness of breath x 2 days. EMS reports patient has had a productive cough with yellow sputum and a Hx of COPD. EMS reports giving 125 solumedrol IV. endorses smoking at this tim about 5 cigarettes a day Patient arrives with a duo ned treatment being administered and a 22 gauge IV in right hand.)  HPI Kim Anthony is a 72 y.o. female with past medical history of COPD, CHF, hypertension, hyperlipidemia, pulmonary embolism on warfarin presenting to the emergency department with shortness of breath.  Patient reports shortness of breath for 2 days.  Patient reports increased sputum production and cough.  Continues to smoke cigarettes.  She reports mild chest tightness.  No nausea or vomiting, fevers or chills, leg swelling.  Reports compliance with her home medication including inhalers.   Past Medical History Past Medical History:  Diagnosis Date   Arthritis    Asthma    Breast cancer (HCC) 2001   Left Breast Cancer   CHF (congestive heart failure) (HCC)    COPD (chronic obstructive pulmonary disease) (HCC)    Coronary artery disease    GERD (gastroesophageal reflux disease)    History of mitral valve replacement with mechanical valve    HLD (hyperlipidemia)    Hypertension    Pulmonary embolism (HCC) 2021   Patient Active Problem List   Diagnosis Date Noted   Thrombocytosis 10/16/2022   Hyperkalemia 10/08/2022   AKI (acute kidney injury) (HCC) 10/07/2022   Alteration in anticoagulation 09/25/2022   History of mitral valve replacement with mechanical valve 09/24/2022   Anticoagulated 09/24/2022   Closed fracture of right hip (HCC) 09/23/2022   Closed intertrochanteric fracture of right femur, initial encounter (HCC) 09/22/2022   H/O mitral valve replacement with mechanical valve  09/22/2022   Chronic HFrEF (heart failure with reduced ejection fraction) (HCC) 09/22/2022   COVID-19 virus infection 08/19/2022   Endometrial thickening on ultrasound 07/07/2022   Gastroesophageal reflux disease without esophagitis 06/29/2022   Chronic pain of both knees 06/29/2022   Vaginal bleeding 04/01/2022   A-fib (HCC) 03/13/2022   Facial edema 03/07/2022   Atypical atrial flutter (HCC)    Combined systolic and diastolic heart failure (HCC) 02/19/2022   CHF (congestive heart failure) (HCC) 02/19/2022   Combined systolic and diastolic congestive heart failure (HCC) 02/18/2022   Acute pulmonary edema (HCC)    Edema of both lower extremities 01/13/2022   Hypotension 01/13/2022   Subacute cough 11/28/2021   COPD exacerbation (HCC) 11/05/2021   Conjunctival hemorrhage of left eye 06/25/2021   Osteoarthritis of left knee 04/24/2021   Seronegative inflammatory arthritis 02/17/2021   High risk medication use 02/17/2021   Elevated transaminase level 01/27/2021   Acute idiopathic gout of multiple sites 09/30/2020   Chronic pain syndrome 01/17/2020   Monoarthritis of wrist 10/18/2019   Ganglion cyst 09/21/2019   Hip pain 08/23/2019   Thoracic ascending aortic aneurysm (HCC) 07/05/2019   Macrocytic anemia 02/16/2019   Breast cancer (HCC)    Acquired absence of left breast 12/20/2018   COPD (chronic obstructive pulmonary disease) (HCC) 11/25/2017   S/P MVR (mitral valve replacement)    Chronic anticoagulation    Tobacco use disorder 02/25/2017   Lymphadenopathy, mediastinal 02/25/2017   Essential hypertension 02/01/2016   Allergic rhinitis 02/01/2016   Healthcare maintenance 01/30/2016   Chronic low back  pain 07/22/2015   Pulmonary embolism (HCC) 07/11/2015   Chronic combined systolic and diastolic heart failure (HCC) 07/11/2015   Headache, common migraine 07/11/2015   HLD (hyperlipidemia) 07/11/2015   Home Medication(s) Prior to Admission medications   Medication Sig Start  Date End Date Taking? Authorizing Provider  albuterol (VENTOLIN HFA) 108 (90 Base) MCG/ACT inhaler Inhale 2 puffs into the lungs every 6 (six) hours as needed for wheezing or shortness of breath. 07/02/22  Yes Marolyn Haller, MD  amiodarone (PACERONE) 200 MG tablet Take 200 mg by mouth daily. 10/01/22  Yes [provider]  fluticasone (FLONASE) 50 MCG/ACT nasal spray Place 1 spray into both nostrils daily as needed for allergies. Patient taking differently: Place 2 sprays into both nostrils daily. 03/02/22 03/02/23 Yes Marrianne Mood, MD  Fluticasone-Umeclidin-Vilant (TRELEGY ELLIPTA) 100-62.5-25 MCG/ACT AEPB Inhale 1 Inhalation into the lungs daily. 08/29/22  Yes Adron Bene, MD  gabapentin (NEURONTIN) 300 MG capsule Take 1 capsule (300 mg total) by mouth 3 (three) times daily. 10/08/22  Yes Mapp, Tavien, MD  HYDROcodone-acetaminophen (NORCO) 10-325 MG tablet Take 1 tablet by mouth every 4 (four) hours as needed. Will reduce back to 5/325 mg next month- for pain after hip surgery Patient taking differently: Take 1 tablet by mouth every 4 (four) hours as needed for moderate pain. Will reduce back to 5/325 mg next month- for pain after hip surgery 11/11/22  Yes Lovorn, Aundra Millet, MD  losartan (COZAAR) 25 MG tablet Take 0.5 tablets (12.5 mg total) by mouth daily. 09/29/22 06/26/23 Yes Atway, Rayann N, DO  metoprolol succinate (TOPROL-XL) 25 MG 24 hr tablet Take 1 tablet (25 mg total) by mouth daily. 06/03/22  Yes Willette Cluster, MD  pantoprazole (PROTONIX) 40 MG tablet Take 1 tablet (40 mg total) by mouth daily. 09/20/22  Yes Willette Cluster, MD  simvastatin (ZOCOR) 20 MG tablet Take 1 tablet (20 mg total) by mouth daily. 03/02/22  Yes Marrianne Mood, MD  torsemide (DEMADEX) 20 MG tablet Take 2 tablets (40 mg total) by mouth daily. 05/13/22  Yes Milford, Anderson Malta, FNP  Vitamin D, Ergocalciferol, (DRISDOL) 1.25 MG (50000 UNIT) CAPS capsule Take 1 capsule (50,000 Units total) by mouth every 7 (seven)  days. 10/05/22  Yes Atway, Rayann N, DO  warfarin (COUMADIN) 5 MG tablet Take 1 tablet (5 mg total) by mouth daily. 10/08/22 11/17/22 Yes Mapp, Tavien, MD  DULoxetine (CYMBALTA) 20 MG capsule Take 20 mg by mouth at bedtime. Patient not taking: Reported on 11/17/2022    [provider]  FARXIGA 10 MG TABS tablet Take 1 tablet by mouth once daily Patient not taking: Reported on 11/17/2022 02/09/22   Gwenevere Abbot, MD  HYDROcodone-acetaminophen [facility]) 5-325 MG tablet Take 1 tablet by mouth every 4 (four) hours as needed for moderate pain. Patient not taking: Reported on 11/17/2022 10/22/22   Genice Rouge, MD  Spacer/Aero-Holding Chambers (BREATHERITE COLL SPACER ADULT) MISC 1 applicator by Does not apply route daily. Patient not taking: Reported on 11/17/2022 01/11/20   Lenward Chancellor D, DO  valACYclovir (VALTREX) 1000 MG tablet Take 1 tablet (1,000 mg total) by mouth 3 (three) times daily. Patient not taking: Reported on 11/17/2022 10/08/22   Karoline Caldwell, MD  Past Surgical History Past Surgical History:  Procedure Laterality Date   APPLICATION OF A-CELL OF CHEST/ABDOMEN Left 01/27/2019   Procedure: APPLICATION OF A-CELL OF CHEST;  Surgeon: Peggye Form, DO;  Location: [facility] OR;  Service: Plastics;  Laterality: Left;   APPLICATION OF WOUND VAC N/A 01/27/2019   Procedure: APPLICATION OF WOUND VAC;  Surgeon: Peggye Form, DO;  Location: [facility] OR;  Service: Plastics;  Laterality: N/A;   BREAST SURGERY Left 2001   for CA   CARDIOVERSION N/A 02/26/2022   Procedure: CARDIOVERSION;  Surgeon: Little Ishikawa, MD;  Location: [facility] ENDOSCOPY;  Service: Cardiovascular;  Laterality: N/A;   COLONOSCOPY WITH PROPOFOL N/A 06/07/2018   Procedure: COLONOSCOPY WITH PROPOFOL;  Surgeon: Charlott Rakes, MD;  Location: [facility] ENDOSCOPY;  Service: Endoscopy;  Laterality:  N/A;   DEBRIDEMENT AND CLOSURE WOUND Left 12/28/2018   Procedure: Debridement And Closure Wound;  Surgeon: Abigail Miyamoto, MD;  Location: [facility] OR;  Service: General;  Laterality: Left;   INCISION AND DRAINAGE OF WOUND Left 01/27/2019   Procedure: IRRIGATION AND DEBRIDEMENT OF CHEST WALL;  Surgeon: Peggye Form, DO;  Location: [facility] OR;  Service: Plastics;  Laterality: Left;   INTRAMEDULLARY (IM) NAIL INTERTROCHANTERIC N/A 09/23/2022   Procedure: INTRAMEDULLARY (IM) NAIL INTERTROCHANTERIC;  Surgeon: Tarry Kos, MD;  Location: [facility] OR;  Service: Orthopedics;  Laterality: N/A;   KNEE ARTHROSCOPY WITH MEDIAL MENISECTOMY Left 08/06/2021   Procedure: KNEE ARTHROSCOPY WITH PARTIAL MEDIAL MENISECTOMY, DEBRIDEMENT;  Surgeon: Jene Every, MD;  Location: [facility] ORS;  Service: Orthopedics;  Laterality: Left;  1 HR   LATISSIMUS FLAP TO BREAST Left 01/18/2019   Procedure: LEFT LATISSIMUS FLAP TO BREAST;  Surgeon: Peggye Form, DO;  Location: [facility] OR;  Service: Plastics;  Laterality: Left;   MASTECTOMY Left    MITRAL VALVE REPLACEMENT     mechanical   POLYPECTOMY  06/07/2018   Procedure: POLYPECTOMY;  Surgeon: Charlott Rakes, MD;  Location: [facility] ENDOSCOPY;  Service: Endoscopy;;   RIGHT/LEFT HEART CATH AND CORONARY ANGIOGRAPHY N/A 01/09/2021   Procedure: RIGHT/LEFT HEART CATH AND CORONARY ANGIOGRAPHY;  Surgeon: Dolores Patty, MD;  Location: [facility] INVASIVE CV LAB;  Service: Cardiovascular;  Laterality: N/A;   WOUND DEBRIDEMENT Left 12/28/2018   Procedure: EXCISION OF LEFT CHRONIC CHEST WALL WOUND;  Surgeon: Abigail Miyamoto, MD;  Location: [facility] OR;  Service: General;  Laterality: Left;   Family History Family History  Problem Relation Age of Onset   Hypertension Mother    Cancer Father    Hypertension Father    Breast cancer Maternal Aunt 50   Heart attack Other     Social History Social History   Tobacco Use   Smoking status: Every Day    Packs/day: 0.10    Years: 30.00     Additional pack years: 0.00    Total pack years: 3.00    Types: Cigarettes   Smokeless tobacco: Never   Tobacco comments:    5 cigs per day  Vaping Use   Vaping Use: Never used  Substance Use Topics   Alcohol use: Yes    Alcohol/week: 3.0 standard drinks of alcohol    Types: 3 Standard drinks or equivalent per week    Comment: beer- ocasional   Drug use: No   Allergies Lisinopril, Acetaminophen-codeine, Propoxyphene, Tape, and Tramadol  Review of Systems Review of Systems  All other systems reviewed and are negative.   Physical Exam Vital Signs  I have reviewed the triage vital signs BP 137/75  Pulse 87   Temp 98.6 F (37 C)   Resp (!) 23   Ht 5\' 5"  (1.651 m)   Wt 75.3 kg   SpO2 94%   BMI 27.62 kg/m  Physical Exam Vitals and nursing note reviewed.  Constitutional:      General: She is not in acute distress.    Appearance: She is well-developed.  HENT:     Head: Normocephalic and atraumatic.     Mouth/Throat:     Mouth: Mucous membranes are moist.  Eyes:     Pupils: Pupils are equal, round, and reactive to light.  Neck:     Vascular: No JVD.  Cardiovascular:     Rate and Rhythm: Normal rate and regular rhythm.     Heart sounds: No murmur heard. Pulmonary:     Effort: Pulmonary effort is normal. No respiratory distress.     Breath sounds: Examination of the right-upper field reveals wheezing. Examination of the left-upper field reveals wheezing. Examination of the right-middle field reveals wheezing. Examination of the left-middle field reveals wheezing. Examination of the right-lower field reveals wheezing. Examination of the left-lower field reveals wheezing. Wheezing present.  Abdominal:     General: Abdomen is flat.     Palpations: Abdomen is soft.     Tenderness: There is no abdominal tenderness.  Musculoskeletal:        General: No tenderness.     Right lower leg: Edema present.     Left lower leg: Edema present.     Comments: Trace b/l LE edema   Skin:    General: Skin is warm and dry.  Neurological:     General: No focal deficit present.     Mental Status: She is alert. Mental status is at baseline.  Psychiatric:        Mood and Affect: Mood normal.        Behavior: Behavior normal.     ED Results and Treatments Labs (all labs ordered are listed, but only abnormal results are displayed) Labs Reviewed  CBC WITH DIFFERENTIAL/PLATELET - Abnormal; Notable for the following components:      Result Value   RBC 3.71 (*)    MCV 102.4 (*)    RDW 18.2 (*)    Platelets 454 (*)    All other components within normal limits  BRAIN NATRIURETIC PEPTIDE - Abnormal; Notable for the following components:   B Natriuretic Peptide 113.5 (*)    All other components within normal limits  PROTIME-INR - Abnormal; Notable for the following components:   Prothrombin Time 34.0 (*)    INR 3.3 (*)    All other components within normal limits  COMPREHENSIVE METABOLIC PANEL - Abnormal; Notable for the following components:   Potassium 2.3 (*)    Glucose, Bld 160 (*)    BUN <5 (*)    Albumin 3.1 (*)    Alkaline Phosphatase 140 (*)    All other components within normal limits  I-STAT VENOUS BLOOD GAS, ED - Abnormal; Notable for the following components:   pH, Ven 7.477 (*)    pCO2, Ven 43.2 (*)    pO2, Ven 128 (*)    Bicarbonate 31.9 (*)    TCO2 33 (*)    Acid-Base Excess 7.0 (*)    Calcium, Ion 1.07 (*)    All other components within normal limits  TROPONIN I (HIGH SENSITIVITY)  Radiology DG Chest Port 1 View  Result Date: 11/17/2022 CLINICAL DATA:  Shortness of breath EXAM: PORTABLE CHEST 1 VIEW COMPARISON:  09/21/2022 FINDINGS: Cardiomegaly and vascular pedicle widening. Mediastinal rotation. Hazy opacification of the bilateral chest, greater on the left where there is ventral radiation related scarring based on 2023  chest CT. Diffuse interstitial opacity. No visible effusion or pneumothorax. Prior median sternotomy with mitral valve replacement. IMPRESSION: Cardiomegaly and vascular congestion. Left pulmonary scarring. Electronically Signed   By: Tiburcio Pea M.D.   On: 11/17/2022 07:32    Pertinent labs & imaging results that were available during my care of the patient were reviewed by me and considered in my medical decision making (see MDM for details).  Medications Ordered in ED Medications  0.9 % NaCl with KCl 20 mEq/ L  infusion (has no administration in time range)  ipratropium-albuterol (DUONEB) 0.5-2.5 (3) MG/3ML nebulizer solution 3 mL (3 mLs Nebulization Given 11/17/22 0754)  ipratropium-albuterol (DUONEB) 0.5-2.5 (3) MG/3ML nebulizer solution 3 mL (3 mLs Nebulization Given 11/17/22 0923)  ipratropium-albuterol (DUONEB) 0.5-2.5 (3) MG/3ML nebulizer solution 3 mL (3 mLs Nebulization Given 11/17/22 1226)  magnesium sulfate IVPB 2 g 50 mL (0 g Intravenous Stopped 11/17/22 1352)  potassium chloride (KLOR-CON) packet 60 mEq (60 mEq Oral Given 11/17/22 1355)                                                                                                                                     Procedures .Critical Care  Performed by: Lonell Grandchild, MD Authorized by: Lonell Grandchild, MD   Critical care provider statement:    Critical care time (minutes):  30   Critical care was necessary to treat or prevent imminent or life-threatening deterioration of the following conditions: hypokalemia.   Critical care was time spent personally by me on the following activities:  Development of treatment plan with patient or surrogate, discussions with consultants, evaluation of patient's response to treatment, examination of patient, ordering and review of laboratory studies, ordering and review of radiographic studies, ordering and performing treatments and interventions, pulse oximetry, re-evaluation of  patient's condition and review of old charts   Care discussed with: admitting provider     (including critical care time)  Medical Decision Making / ED Course   MDM:  72 year old female presenting to the emergency department shortness of breath.  Patient well-appearing, physical exam with diffuse wheezing, patient not hypoxic  Suspect COPD exacerbation.  Does report increased sputum exacerbation.  VBG without evidence of CO2 retention.  Patient does report that she feels better after EMS treatment.  Received steroids from EMS already.  She reports mild chest tightness, doubt ACS, will check troponin.  Has trace peripheral edema but less likely CHF exacerbation without crackles or JVD.  Will check chest x-ray to evaluate for pneumonia or pneumothorax.  Will give additional breathing treatment and reassess.  Clinical Course as of 11/17/22  0981  Tue Nov 17, 2022  1347 Discussed with internal medicine teaching service who will admit patient.  [WS]    Clinical Course User Index [WS] Lonell Grandchild, MD     Additional history obtained: -Additional history obtained from ems -External records from outside source obtained and reviewed including: Chart review including previous notes, labs, imaging, consultation notes including previous ER visits   Lab Tests: -I ordered, reviewed, and interpreted labs.   The pertinent results include:   Labs Reviewed  CBC WITH DIFFERENTIAL/PLATELET - Abnormal; Notable for the following components:      Result Value   RBC 3.71 (*)    MCV 102.4 (*)    RDW 18.2 (*)    Platelets 454 (*)    All other components within normal limits  BRAIN NATRIURETIC PEPTIDE - Abnormal; Notable for the following components:   B Natriuretic Peptide 113.5 (*)    All other components within normal limits  PROTIME-INR - Abnormal; Notable for the following components:   Prothrombin Time 34.0 (*)    INR 3.3 (*)    All other components within normal limits   COMPREHENSIVE METABOLIC PANEL - Abnormal; Notable for the following components:   Potassium 2.3 (*)    Glucose, Bld 160 (*)    BUN <5 (*)    Albumin 3.1 (*)    Alkaline Phosphatase 140 (*)    All other components within normal limits  I-STAT VENOUS BLOOD GAS, ED - Abnormal; Notable for the following components:   pH, Ven 7.477 (*)    pCO2, Ven 43.2 (*)    pO2, Ven 128 (*)    Bicarbonate 31.9 (*)    TCO2 33 (*)    Acid-Base Excess 7.0 (*)    Calcium, Ion 1.07 (*)    All other components within normal limits  TROPONIN I (HIGH SENSITIVITY)    Notable for normal troponin. Significant hypokalemia   EKG   EKG Interpretation  Date/Time:  Tuesday Nov 17 2022 06:53:16 EDT Ventricular Rate:  82 PR Interval:  165 QRS Duration: 121 QT Interval:  516 QTC Calculation: 603 R Axis:   -82 Text Interpretation: Sinus rhythm Multiple premature complexes, vent & supraven Nonspecific IVCD with LAD Anteroseptal infarct, old Nonspecific T wave abnormality Compared to previous tracing T wave changes in V2-3 have improved Confirmed by Alvino Blood ([id number]) on 11/17/2022 7:39:44 AM         Imaging Studies ordered: I ordered imaging studies including CXR On my interpretation imaging demonstrates no acute process I independently visualized and interpreted imaging. I agree with the radiologist interpretation   Medicines ordered and prescription drug management: Meds ordered this encounter  Medications   ipratropium-albuterol (DUONEB) 0.5-2.5 (3) MG/3ML nebulizer solution 3 mL   ipratropium-albuterol (DUONEB) 0.5-2.5 (3) MG/3ML nebulizer solution 3 mL   ipratropium-albuterol (DUONEB) 0.5-2.5 (3) MG/3ML nebulizer solution 3 mL   magnesium sulfate IVPB 2 g 50 mL   potassium chloride (KLOR-CON) packet 60 mEq   DISCONTD: potassium chloride 10 mEq in 100 mL IVPB   0.9 % NaCl with KCl 20 mEq/ L  infusion    -I have reviewed the patients home medicines and have made adjustments as  needed   Consultations Obtained: I requested consultation with the IMT[facility],  and discussed lab and imaging findings as well as pertinent plan - they recommend: admission   Cardiac Monitoring: The patient was maintained on a cardiac monitor.  I personally viewed and interpreted the cardiac monitored which showed an underlying rhythm  of: NSR  Social Determinants of Health:  Diagnosis or treatment significantly limited by social determinants of health: current smoker   Reevaluation: After the interventions noted above, I reevaluated the patient and found that their symptoms have improved  Co morbidities that complicate the patient evaluation  Past Medical History:  Diagnosis Date   Arthritis    Asthma    Breast cancer (HCC) 2001   Left Breast Cancer   CHF (congestive heart failure) (HCC)    COPD (chronic obstructive pulmonary disease) (HCC)    Coronary artery disease    GERD (gastroesophageal reflux disease)    History of mitral valve replacement with mechanical valve    HLD (hyperlipidemia)    Hypertension    Pulmonary embolism (HCC) 2021      Dispostion: Disposition decision including need for hospitalization was considered, and patient admitted to the hospital.    Final Clinical Impression(s) / ED Diagnoses Final diagnoses:  COPD exacerbation (HCC)  Hypokalemia     This chart was dictated using voice recognition software.  Despite best efforts to proofread,  errors can occur which can change the documentation meaning.    Lonell Grandchild, MD 11/17/22 [phone]

## 2022-11-17 NOTE — H&P (Signed)
Date: 11/17/2022               Patient Name:  Kim Anthony [name]: [id number]  DOB: 1950-11-22 Age / Sex: 72 y.o., female   PCP: Willette Cluster, MD         Medical Service: Internal Medicine Teaching Service         Attending Physician: Dr. [name], MD    First Contact: Lajuana Ripple, MD      Pager: [phone]      Second Contact: Rudene Christians, DO      Pager: Milinda Pointer [phone]           After Hours (After 5p/  First Contact Pager: [phone]  weekends / holidays): Second Contact Pager: [phone]   SUBJECTIVE   Chief Complaint: breath shortness   History of Present Illness:  Kim Anthony is a 72 year old female with a past medical history of hypertension, hyperlipidemia, tobacco use, A-Fib, HFrEF post MV replacement, PE, COPD, and GERD who presents with breath shortness.   States that she started to experience progressive breath shortness upon awakening this morning. Associated symptoms include rhinorrhea, congestion, and a productive cough that started last night. Denies fever, chills, sweats, nausea, vomiting, diarrhea, dysuria, orthopnea, leg swelling, and chest or abdominal pain. Reports daily adherence to her medications including breathing and oral formulations. She has been using her albuterol inhaler more frequently throughout the past day, which has been modestly helpful in providing symptomatic relief. History is notable for recent weight gain that patient attributes to resuming regular diet after a several-week stay in rehabilitation.     ED Course: Upon arrival to the ED, vitals notable for BP 151/77. Laboratory testing demonstrated BNP 114, pH 7.48, pCO2 43, K 2.3, Gluc 160, BUN <5, and AlkPhos 140. Chest radiograph demonstrated cardiomegaly and vascular congestion. Ipratropium-albuterol, magnesium, potassium, and intravenous fluids were administered in the ED.    Meds:   Albuterol 108ug q6 PRN Amiodarone 200mg  q24 Duloxetine 20mg  q24 Dapagliflozin 10mg   q24 Fluticasone 50ug q24 PRN Fluticasone-umeclidinium-vilanterol 100-62.5-25ug q24 Gabapentin 300mg  q8 Hydrocodone-acetaminophen 10-325mg  q4 PRN Losartan 12.5mg  q24 Metoprolol 25mg  q24 Pantoprazole 40mg  q24 Simvastatin 20mg  q24 Torsemide 20mg  q24 Ergocalciferol 50000U q168 Warfarin 5mg  q24    Past Medical History  Past Surgical History:  Procedure Laterality Date   APPLICATION OF A-CELL OF CHEST/ABDOMEN Left 01/27/2019   Procedure: APPLICATION OF A-CELL OF CHEST;  Surgeon: Peggye Form, DO;  Location: [facility] OR;  Service: Plastics;  Laterality: Left;   APPLICATION OF WOUND VAC N/A 01/27/2019   Procedure: APPLICATION OF WOUND VAC;  Surgeon: Peggye Form, DO;  Location: [facility] OR;  Service: Plastics;  Laterality: N/A;   BREAST SURGERY Left 2001   for CA   CARDIOVERSION N/A 02/26/2022   Procedure: CARDIOVERSION;  Surgeon: Little Ishikawa, MD;  Location: [facility] ENDOSCOPY;  Service: Cardiovascular;  Laterality: N/A;   COLONOSCOPY WITH PROPOFOL N/A 06/07/2018   Procedure: COLONOSCOPY WITH PROPOFOL;  Surgeon: Charlott Rakes, MD;  Location: [facility] ENDOSCOPY;  Service: Endoscopy;  Laterality: N/A;   DEBRIDEMENT AND CLOSURE WOUND Left 12/28/2018   Procedure: Debridement And Closure Wound;  Surgeon: Abigail Miyamoto, MD;  Location: [facility] OR;  Service: General;  Laterality: Left;   INCISION AND DRAINAGE OF WOUND Left 01/27/2019   Procedure: IRRIGATION AND DEBRIDEMENT OF CHEST WALL;  Surgeon: Peggye Form, DO;  Location: [facility] OR;  Service: Plastics;  Laterality: Left;   INTRAMEDULLARY (IM) NAIL INTERTROCHANTERIC N/A 09/23/2022   Procedure: INTRAMEDULLARY (IM) NAIL INTERTROCHANTERIC;  Surgeon: Roda Shutters,  Edwin Cap, MD;  Location: [facility] OR;  Service: Orthopedics;  Laterality: N/A;   KNEE ARTHROSCOPY WITH MEDIAL MENISECTOMY Left 08/06/2021   Procedure: KNEE ARTHROSCOPY WITH PARTIAL MEDIAL MENISECTOMY, DEBRIDEMENT;  Surgeon: Jene Every, MD;  Location: [facility] ORS;  Service: Orthopedics;   Laterality: Left;  1 HR   LATISSIMUS FLAP TO BREAST Left 01/18/2019   Procedure: LEFT LATISSIMUS FLAP TO BREAST;  Surgeon: Peggye Form, DO;  Location: [facility] OR;  Service: Plastics;  Laterality: Left;   MASTECTOMY Left    MITRAL VALVE REPLACEMENT     mechanical   POLYPECTOMY  06/07/2018   Procedure: POLYPECTOMY;  Surgeon: Charlott Rakes, MD;  Location: [facility] ENDOSCOPY;  Service: Endoscopy;;   RIGHT/LEFT HEART CATH AND CORONARY ANGIOGRAPHY N/A 01/09/2021   Procedure: RIGHT/LEFT HEART CATH AND CORONARY ANGIOGRAPHY;  Surgeon: Dolores Patty, MD;  Location: [facility] INVASIVE CV LAB;  Service: Cardiovascular;  Laterality: N/A;   WOUND DEBRIDEMENT Left 12/28/2018   Procedure: EXCISION OF LEFT CHRONIC CHEST WALL WOUND;  Surgeon: Abigail Miyamoto, MD;  Location: [facility] OR;  Service: General;  Laterality: Left;     Social:  Lives With: daughter Support: family Level of Function: independent in ADLs PCP: Willette Cluster MD Substances: about six cigarettes per day previously more for about sixty years, one 12oz beer per day, denies cocaine and marijuana use    Family History:   Family History  Problem Relation Age of Onset   Hypertension Mother    Cancer Father    Hypertension Father    Breast cancer Maternal Aunt 50   Heart attack Other       Allergies:  Allergies as of 11/17/2022 - Review Complete 11/17/2022  Allergen Reaction Noted   Lisinopril Swelling 01/07/2021   Acetaminophen-codeine Itching 07/10/2015   Propoxyphene Itching 01/18/2019   Tape Rash 03/09/2019   Tramadol Itching, Nausea And Vomiting, and Rash 07/10/2015      Review of Systems: A complete ROS was negative except as per HPI.    OBJECTIVE:   Physical Exam: Blood pressure (!) 144/104, pulse 95, temperature 98.6 F (37 C), resp. rate 20, height 5\' 5"  (1.651 m), weight 75.3 kg, SpO2 94 %.   General:      awake and alert, lying in bed, cooperative, not in acute distress Skin:       warm and dry,  slightly decreased skin turgor, no rashes Eyes:      extraocular movements intact, pupils round, no periorbital swelling or scleral icterus Lungs:      increased respiratory effort, breathing slightly labored, symmetrical chest rise, course breath sounds throughout, decreased at bases with some bibasilar crackles, mild end-expiratory wheezing Cardiac:      regular rate and rhythm, prominent S1 and S2, capillary refill 2-3 seconds, no pitting edema Abdomen:      soft and non-distended, normoactive bowel sounds, no tenderness to palpation or guarding Neurologic:      oriented to person-place-time, moving all extremities, no gross focal deficits Psychiatric:      euthymic mood with congruent affect, intelligible speech   Labs: CBC    Component Value Date/Time   WBC 7.8 11/17/2022 0646   RBC 3.71 (L) 11/17/2022 0646   HGB 13.6 11/17/2022 0709   HGB 12.8 01/13/2022 1038   HCT 40.0 11/17/2022 0709   HCT 38.8 01/13/2022 1038   PLT 454 (H) 11/17/2022 0646   PLT 427 01/13/2022 1038   MCV 102.4 (H) 11/17/2022 0646   MCV 96 01/13/2022 1038   MCH 33.2 11/17/2022  0646   MCHC 32.4 11/17/2022 0646   RDW 18.2 (H) 11/17/2022 0646   RDW 11.4 (L) 01/13/2022 1038   LYMPHSABS 1.3 11/17/2022 0646   LYMPHSABS 3.0 04/21/2018 1624   MONOABS 0.7 11/17/2022 0646   EOSABS 0.1 11/17/2022 0646   EOSABS 0.2 04/21/2018 1624   BASOSABS 0.1 11/17/2022 0646   BASOSABS 0.1 04/21/2018 1624     CMP     Component Value Date/Time   NA 138 11/17/2022 1135   NA 132 (L) 03/02/2022 1617   K 2.3 (LL) 11/17/2022 1135   CL 101 11/17/2022 1135   CO2 24 11/17/2022 1135   GLUCOSE 160 (H) 11/17/2022 1135   BUN <5 (L) 11/17/2022 1135   BUN 35 (H) 03/02/2022 1617   CREATININE 0.76 11/17/2022 1135   CALCIUM 9.1 11/17/2022 1135   PROT 7.2 11/17/2022 1135   PROT 7.9 01/27/2021 1015   ALBUMIN 3.1 (L) 11/17/2022 1135   ALBUMIN 4.6 01/27/2021 1015   AST 32 11/17/2022 1135   ALT 13 11/17/2022 1135   ALKPHOS 140 (H)  11/17/2022 1135   BILITOT 0.8 11/17/2022 1135   BILITOT 0.5 01/27/2021 1015   GFRNONAA >60 11/17/2022 1135   GFRAA 104 02/28/2020 1159     Imaging:  DG Chest Port 1 View Result Date: 11/17/2022 IMPRESSION: Cardiomegaly and vascular congestion. Left pulmonary scarring.     ECG: My personal interpretation is widened QRS and prolonged QT interval, which is new from prior ECG on 10-07-2022    ASSESSMENT & PLAN:   Assessment & Plan by Problem: Principal Problem:   COPD exacerbation [facility])   Kim Muhlestein is a 72 year old female with a past medical history of hypertension, hyperlipidemia, tobacco use, A-Fib, HFrEF post MV replacement, PE, COPD, and GERD who presents with breath shortness, now admitted for acute COPD exacerbation.    ---Chronic obstructive pulmonary disease exacerbation Patient has history of COPD managed at home with fluticasone, umeclidinium, vilanterol, and albuterol. Presented on 5-28 with acutely worsening breath shortness and productive cough. Associated symptoms include rhinorrhea and congestion. Denies recent changes in and reports daily adherence to her current medications. Upon arrival, exam notable for course breath sounds and mild expiratory wheezing. Presentation consistent with an acute COPD exacerbation, possibly secondary to viral upper respiratory infection. Breathing regimen of ipratropium-albuterol, fluticasone-vilanterol, and umeclidinium was initiated on admission. Course of prednisone and azithromycin also started.  > Ipratropium-albuterol 0.5-2.5mg  q6  > Fluticasone-vilanterol 100-25ug q24  > Umeclidinium 62.5ug q24  > Prednisone 40mg  q24, last dose 6-2  > Azithromycin 500mg  q24, last dose 5-30  > Check viral respiratory panel  > Trend CBC q24  > Supplemental oxygen as needed to maintain SpO2 90-92%   ---Heart failure reduced ejection fraction ---Hypokalemia Patient has history of heart failure reduced ejection fraction, most recent echocardiogram  performed 04-2022 demonstrated EF 35-40 and mechanical MV. Home medications include metoprolol, losartan, dapagliflozin, and torsemide. Presented on 5-28 with worsening breath shortness and cough. Denies orthopnea, leg swelling, and exertional dyspnea. History notable for recent weight change, which patient attributes to increased caloric intake. Upon arrival, K 2.3 and BNP 114 below apparent baseline. Chest radiograph with evidence of vascular congestion. Exam was largely euvolemic, mild pulmonary crackles appreciated on pulmonary auscultation. Presentation most consistent with COPD exacerbation, though heart failure exacerbation is possibility as well. Plan to resume torsemide at home dose while closely monitoring electrolytes and renal function.  > Metoprolol 25mg  q24  > Losartan 12.5mg  q24  > Dapagliflozin 10mg  q24  > Torsemide 40mg   q24  > Trend BMP q24   ---Atrial fibrillation ---Mechanical mitral valve  Patient has history of atrial fibrillation and mechanical mitral valve. Takes warfarin at home, to which she reports daily adherence. Previously prescribed amiodarone, however, this medication has since been discontinued. Upon arrival, heart rate and rhythm was normal.  > Warfarin, dosing per pharmacy   ---Hypertension Patient has history of hypertension managed at home with losartan, metoprolol, and torsemide. Blood pressure has remained slightly elevated during hospitalization. Home medications were continued on admission.  > Metoprolol 25mg  q24  > Losartan 12.5mg  q24  > Torsemide 40mg  q24   ---Hyperlipidemia Patient has history of hyperlipidemia managed at home with simvastatin, which was continued on admission.  > Simvastatin 20mg  q24   ---Chronic back pain complicated by radiculopathy Patient has longstanding history of chronic back pain complicated by sciatica. Managed with hydrocodone-acetaminophen at home, which was continued on admission.  > Hydrocodone-acetaminophen  10-325mg  q6 PRN   ---Tobacco use Patient has history of tobacco use dating back sixty years and currently smokes about six cigarettes per day. She is aware that smoking negatively impacts her health including lung function. Expressed interest in nicotine patch during hospitalization.  > Nicotine patch 7mg  q24  > Encourage complete cessation     Diet: Normal VTE:  warfarin IVF: None,None Code: Full  Prior to Admission Living Arrangement: Home, living with daughter  Anticipated Discharge Location: Home Barriers to Discharge: medical management, symptomatic improvement  Dispo: Admit patient to Observation with expected length of stay less than 2 midnights.  Signed: Crissie Sickles, MD Internal Medicine Resident PGY-1  11/17/2022, 7:17 PM

## 2022-11-18 ENCOUNTER — Other Ambulatory Visit (HOSPITAL_COMMUNITY): Payer: Self-pay

## 2022-11-18 DIAGNOSIS — J441 Chronic obstructive pulmonary disease with (acute) exacerbation: Secondary | ICD-10-CM

## 2022-11-18 DIAGNOSIS — M5416 Radiculopathy, lumbar region: Secondary | ICD-10-CM

## 2022-11-18 DIAGNOSIS — G8929 Other chronic pain: Secondary | ICD-10-CM

## 2022-11-18 DIAGNOSIS — I110 Hypertensive heart disease with heart failure: Secondary | ICD-10-CM

## 2022-11-18 DIAGNOSIS — I4891 Unspecified atrial fibrillation: Secondary | ICD-10-CM

## 2022-11-18 DIAGNOSIS — I509 Heart failure, unspecified: Secondary | ICD-10-CM

## 2022-11-18 DIAGNOSIS — E785 Hyperlipidemia, unspecified: Secondary | ICD-10-CM

## 2022-11-18 DIAGNOSIS — E876 Hypokalemia: Secondary | ICD-10-CM | POA: Diagnosis not present

## 2022-11-18 DIAGNOSIS — Z7901 Long term (current) use of anticoagulants: Secondary | ICD-10-CM

## 2022-11-18 DIAGNOSIS — F17210 Nicotine dependence, cigarettes, uncomplicated: Secondary | ICD-10-CM

## 2022-11-18 DIAGNOSIS — Z952 Presence of prosthetic heart valve: Secondary | ICD-10-CM

## 2022-11-18 LAB — CBC
HCT: 35.5 % — ABNORMAL LOW (ref 36.0–46.0)
Hemoglobin: 11.3 g/dL — ABNORMAL LOW (ref 12.0–15.0)
MCH: 33 pg (ref 26.0–34.0)
MCHC: 31.8 g/dL (ref 30.0–36.0)
MCV: 103.8 fL — ABNORMAL HIGH (ref 80.0–100.0)
Platelets: 371 10*3/uL (ref 150–400)
RBC: 3.42 MIL/uL — ABNORMAL LOW (ref 3.87–5.11)
RDW: 17.3 % — ABNORMAL HIGH (ref 11.5–15.5)
WBC: 9.6 10*3/uL (ref 4.0–10.5)
nRBC: 0.2 % (ref 0.0–0.2)

## 2022-11-18 LAB — RESPIRATORY PANEL BY PCR

## 2022-11-18 LAB — BASIC METABOLIC PANEL
Anion gap: 14 (ref 5–15)
BUN: 7 mg/dL — ABNORMAL LOW (ref 8–23)
CO2: 26 mmol/L (ref 22–32)
Calcium: 9.1 mg/dL (ref 8.9–10.3)
Chloride: 98 mmol/L (ref 98–111)
Creatinine, Ser: 0.78 mg/dL (ref 0.44–1.00)
GFR, Estimated: >60 mL/min (ref >60)
Glucose, Bld: 112 mg/dL — ABNORMAL HIGH (ref 70–99)
Potassium: 3.4 mmol/L — ABNORMAL LOW (ref 3.5–5.1)
Sodium: 138 mmol/L (ref 135–145)

## 2022-11-18 LAB — PROTIME-INR
INR: 2.6 — ABNORMAL HIGH (ref 0.8–1.2)
Prothrombin Time: 28.3 seconds — ABNORMAL HIGH (ref 11.4–15.2)

## 2022-11-18 MED ORDER — PREDNISONE 20 MG PO TABS: 40.0000 mg | ORAL_TABLET | Freq: Every day | ORAL | 0 refills | Status: AC

## 2022-11-18 MED ORDER — POTASSIUM CHLORIDE CRYS ER 20 MEQ PO TBCR: 40.0000 meq | EXTENDED_RELEASE_TABLET | Freq: Every day | ORAL | 0 refills | Status: DC

## 2022-11-18 MED ORDER — NICOTINE 7 MG/24HR TD PT24: 7.0000 mg | MEDICATED_PATCH | Freq: Every day | TRANSDERMAL | 0 refills | Status: DC

## 2022-11-18 MED ORDER — AZITHROMYCIN 500 MG PO TABS: ORAL_TABLET | ORAL | 0 refills | Status: DC

## 2022-11-18 MED ORDER — POTASSIUM CHLORIDE CRYS ER 20 MEQ PO TBCR
40.0000 meq | EXTENDED_RELEASE_TABLET | Freq: Once | ORAL | Status: AC
  Administered 2022-11-18: 40 meq via ORAL
  Filled 2022-11-18: qty 2

## 2022-11-18 NOTE — TOC Benefit Eligibility Note (Signed)
Patient Product/process development scientist completed.    The patient is currently admitted and upon discharge could be taking varenicline (Chantix) 0.5 mg tablets.  The current 30 day co-pay is $0.00.   The patient is currently admitted and upon discharge could be taking Nicotine 14 mg/24 hour patches.  Product Not Covered  The patient is insured through [employer] Part D   This test claim was processed through [facility]- copay amounts may vary at other pharmacies due to pharmacy/plan contracts, or as the patient moves through the different stages of their insurance plan.  Roland Earl, CPHT Pharmacy Patient Advocate Specialist [facility] Patient Advocate Team Direct Number: [phone]  Fax: [phone]

## 2022-11-18 NOTE — Care Management Obs Status (Signed)
MEDICARE OBSERVATION STATUS NOTIFICATION   Patient Details  Name: Kim Anthony [name]: [id number] Date of Birth: May 17, 1951   Medicare Observation Status Notification Given:  Yes    Leone Haven, RN 11/18/2022, 1:25 PM

## 2022-11-18 NOTE — TOC Initial Note (Addendum)
Transition of Care [facility]) - Initial/Assessment Note    Patient Details  Name: Kim Anthony [name]: [id number] Date of Birth: Nov 15, 1950  Transition of [facility]) CM/SW Contact:    Leone Haven, RN Phone Number: 11/18/2022, 1:33 PM  Clinical Narrative:                 From home with daughter, she has a walker, rollator , cane and 3n1 at home. Daughter takes her to [facility] and she will transport her home today, NCM offered choice, she is active right now with HH for HHPT, HHOT. Daughter said it is with [facility], but [facility] does not have her as active. NCM confirmed with [facility] and they do have her for active patient.  Informed Tresa Endo she is for dc today and they would like to continue.          Patient Goals and CMS Choice            Expected Discharge Plan and Services         Expected Discharge Date: 11/18/22                                    Prior Living Arrangements/Services                       Activities of Daily Living Home Assistive Devices/Equipment: Dan Humphreys (specify type) ADL Screening (condition at time of admission) Patient's cognitive ability adequate to safely complete daily activities?: Yes Is the patient deaf or have difficulty hearing?: Yes Does the patient have difficulty seeing, even when wearing glasses/contacts?: No Does the patient have difficulty concentrating, remembering, or making decisions?: No Patient able to express need for assistance with ADLs?: Yes Does the patient have difficulty dressing or bathing?: No Independently performs ADLs?: Yes (appropriate for developmental age) Communication: Independent Dressing (OT): Independent Grooming: Independent Feeding: Independent Bathing: Independent Toileting: Independent In/Out Bed: Independent Walks in Home: Independent Does the patient have difficulty walking or climbing stairs?: Yes Weakness of Legs: Right Weakness of Arms/Hands: None  Permission  Sought/Granted                  Emotional Assessment              Admission diagnosis:  Hypokalemia [E87.6] COPD exacerbation (HCC) [J44.1] Patient Active Problem List   Diagnosis Date Noted   Hypokalemia 11/18/2022   Thrombocytosis 10/16/2022   Hyperkalemia 10/08/2022   AKI (acute kidney injury) (HCC) 10/07/2022   Alteration in anticoagulation 09/25/2022   History of mitral valve replacement with mechanical valve 09/24/2022   Anticoagulated 09/24/2022   Closed fracture of right hip (HCC) 09/23/2022   Closed intertrochanteric fracture of right femur, initial encounter (HCC) 09/22/2022   H/O mitral valve replacement with mechanical valve 09/22/2022   Chronic HFrEF (heart failure with reduced ejection fraction) (HCC) 09/22/2022   COVID-19 virus infection 08/19/2022   Endometrial thickening on ultrasound 07/07/2022   Gastroesophageal reflux disease without esophagitis 06/29/2022   Chronic pain of both knees 06/29/2022   Vaginal bleeding 04/01/2022   A-fib (HCC) 03/13/2022   Facial edema 03/07/2022   Atypical atrial flutter (HCC)    Combined systolic and diastolic heart failure (HCC) 02/19/2022   CHF (congestive heart failure) (HCC) 02/19/2022   Combined systolic and diastolic congestive heart failure (HCC) 02/18/2022   Acute pulmonary edema (HCC)    Edema of both lower extremities 01/13/2022  Hypotension 01/13/2022   Subacute cough 11/28/2021   COPD exacerbation (HCC) 11/05/2021   Conjunctival hemorrhage of left eye 06/25/2021   Osteoarthritis of left knee 04/24/2021   Seronegative inflammatory arthritis 02/17/2021   High risk medication use 02/17/2021   Elevated transaminase level 01/27/2021   Acute idiopathic gout of multiple sites 09/30/2020   Chronic pain syndrome 01/17/2020   Monoarthritis of wrist 10/18/2019   Ganglion cyst 09/21/2019   Hip pain 08/23/2019   Thoracic ascending aortic aneurysm (HCC) 07/05/2019   Macrocytic anemia 02/16/2019   Breast  cancer (HCC)    Acquired absence of left breast 12/20/2018   COPD (chronic obstructive pulmonary disease) (HCC) 11/25/2017   S/P MVR (mitral valve replacement)    Chronic anticoagulation    Tobacco use disorder 02/25/2017   Lymphadenopathy, mediastinal 02/25/2017   Essential hypertension 02/01/2016   Allergic rhinitis 02/01/2016   Healthcare maintenance 01/30/2016   Chronic low back pain 07/22/2015   Pulmonary embolism (HCC) 07/11/2015   Chronic combined systolic and diastolic heart failure (HCC) 07/11/2015   Headache, common migraine 07/11/2015   HLD (hyperlipidemia) 07/11/2015   PCP:  Willette Cluster, MD Pharmacy:   [facility] [address] [place], [place] - [address] [place] [address] Phone: [phone] Fax: [phone]     Social Determinants of Health (SDOH) Social History: SDOH Screenings   Food Insecurity: No Food Insecurity (11/17/2022)  Housing: Low Risk  (11/17/2022)  Transportation Needs: No Transportation Needs (11/17/2022)  Utilities: Not At Risk (11/17/2022)  Depression (PHQ2-9): Low Risk  (07/02/2022)  Financial Resource Strain: Medium Risk (02/27/2021)  Social Connections: Socially Isolated (11/28/2019)  Tobacco Use: High Risk (11/17/2022)   SDOH Interventions:     Readmission Risk Interventions    02/19/2022    3:36 PM  Readmission Risk Prevention Plan  Transportation Screening Complete  PCP or Specialist Appt within 3-5 Days Complete  HRI or Home Care Consult Complete  Social Work Consult for Recovery Care Planning/Counseling Complete  Palliative Care Screening Not Applicable  Medication Review Oceanographer) Complete

## 2022-11-18 NOTE — Discharge Summary (Addendum)
Name: Kim Anthony [name]: [id number] DOB: 12-04-1950 72 y.o. PCP: Kim Cluster, MD  Date of Admission: 11/17/2022  6:36 AM Date of Discharge: 11-18-2022 Attending Physician: Kim Eon, MD  Discharge Diagnosis: Principal Problem:   COPD exacerbation [facility])     Discharge Medications: Allergies as of 11/18/2022       Reactions   Lisinopril Swelling   Acetaminophen-codeine Itching   Propoxyphene Itching   Darvocet   Tape Rash   Plastic   Tramadol Itching, Nausea And Vomiting, Rash        Medication List     STOP taking these medications    amiodarone 200 MG tablet Commonly known as: PACERONE   valACYclovir 1000 MG tablet Commonly known as: VALTREX       TAKE these medications    albuterol 108 (90 Base) MCG/ACT inhaler Commonly known as: VENTOLIN HFA Inhale 2 puffs into the lungs every 6 (six) hours as needed for wheezing or shortness of breath.   azithromycin 500 MG tablet Commonly known as: ZITHROMAX Please take one tablet (500mg ) tomorrow 5-30 and then you can stop taking this medication Start taking on: Nov 19, 2022   BreatheRite Coll Spacer Adult Misc 1 applicator by Does not apply route daily.   DULoxetine 20 MG capsule Commonly known as: CYMBALTA Take 20 mg by mouth at bedtime.   Farxiga 10 MG Tabs tablet Generic drug: dapagliflozin propanediol Take 1 tablet by mouth once daily   fluticasone 50 MCG/ACT nasal spray Commonly known as: Flonase Place 1 spray into both nostrils daily as needed for allergies. What changed:  how much to take when to take this   gabapentin 300 MG capsule Commonly known as: NEURONTIN Take 1 capsule (300 mg total) by mouth 3 (three) times daily.   HYDROcodone-acetaminophen 10-325 MG tablet Commonly known as: Norco Take 1 tablet by mouth every 4 (four) hours as needed. Will reduce back to 5/325 mg next month- for pain after hip surgery What changed:  reasons to take this Another medication with the same name  was removed. Continue taking this medication, and follow the directions you see here.   losartan 25 MG tablet Commonly known as: COZAAR Take 0.5 tablets (12.5 mg total) by mouth daily.   metoprolol succinate 25 MG 24 hr tablet Commonly known as: TOPROL-XL Take 1 tablet (25 mg total) by mouth daily.   nicotine 7 mg/24hr patch Commonly known as: NICODERM CQ - dosed in mg/24 hr Place 1 patch (7 mg total) onto the skin daily. Start taking on: Nov 19, 2022   pantoprazole 40 MG tablet Commonly known as: Protonix Take 1 tablet (40 mg total) by mouth daily.   potassium chloride SA 20 MEQ tablet Commonly known as: KLOR-CON M Take 2 tablets (40 mEq total) by mouth daily.   predniSONE 20 MG tablet Commonly known as: DELTASONE Take 2 tablets (40 mg total) by mouth daily with breakfast for 3 days. Start taking on: Nov 19, 2022   simvastatin 20 MG tablet Commonly known as: ZOCOR Take 1 tablet (20 mg total) by mouth daily.   torsemide 20 MG tablet Commonly known as: DEMADEX Take 2 tablets (40 mg total) by mouth daily.   Trelegy Ellipta 100-62.5-25 MCG/ACT Aepb Generic drug: Fluticasone-Umeclidin-Vilant Inhale 1 Inhalation into the lungs daily.   Vitamin D (Ergocalciferol) 1.25 MG (50000 UNIT) Caps capsule Commonly known as: DRISDOL Take 1 capsule (50,000 Units total) by mouth every 7 (seven) days.   warfarin 5 MG tablet Commonly known as:  Coumadin Take as directed. If you are unsure how to take this medication, talk to your nurse or doctor. Original instructions: Take 1 tablet (5 mg total) by mouth daily.         Disposition and follow-up:    Kim Anthony was discharged from [facility] in Stable condition.  At the hospital follow up visit please address:   1.  Chronic obstructive pulmonary disease: inquire about symptoms including breath shortness and wheezing, assess respiratory function and oxygenation if indicated, confirm adherence to medication  regimen including fluticasone and umeclidinium, ensure completion of prednisone last dose 6-2 and azithromycin 5-30 courses, offer assistance with tobacco cessation such as varenicline  2.  Systolic heart failure: inquire about symptoms including orthopnea and leg swelling, assess volume status, confirm adherence to medication regimen including metoprolol and torsemide  3.  Hypokalemia: check potassium level, confirm adherence to supplementation of once daily and adjust regimen as needed  4.  Labs / imaging needed at time of follow-up: BMP  5.  Pending labs / tests needing follow-up: none    Follow-up Appointments:  Internal Medicine Clinic, you should receive a call with exact appointment date and time   Hospital Course by problem list:  Kim Anthony is a 72 year old female with a past medical history of hypertension, hyperlipidemia, tobacco use, A-Fib, HFrEF post MV replacement, PE, COPD, and GERD who presents with breath shortness, now admitted for acute COPD exacerbation.      ---Chronic obstructive pulmonary disease exacerbation Patient has history of COPD managed at home with fluticasone, umeclidinium, vilanterol, and albuterol. Presented on 5-28 with acutely worsening breath shortness and productive cough. Associated symptoms include rhinorrhea and congestion. Denies recent changes in and reports daily adherence to her current medications. Upon arrival, exam notable for course breath sounds and mild expiratory wheezing. Presentation consistent with an acute COPD exacerbation, possibly secondary to viral upper respiratory infection. Breathing regimen of ipratropium-albuterol, fluticasone-vilanterol, and umeclidinium initiated on admission. Course of prednisone and azithromycin also started. Respiratory panel was negative. Symptoms including breath shortness and wheezing improved with treatment on hospitalization day two. Oxygen saturation remained above 90% during pre-discharge  physical therapy evaluation, which included climbing stairs. Discharged with home regimen plus courses of prednisone and azithromycin.     ---Heart failure reduced ejection fraction ---Hypokalemia Patient has history of heart failure reduced ejection fraction, most recent echocardiogram performed 04-2022 demonstrated EF 35-40 and mechanical MV. Home medications include metoprolol, losartan, dapagliflozin, and torsemide. Presented on 5-28 with worsening breath shortness and cough. Denies orthopnea, leg swelling, and exertional dyspnea. History notable for recent weight change, which patient attributes to increased caloric intake. Upon arrival, K 2.3 and BNP 114 below apparent baseline. Chest radiograph with evidence of vascular congestion. Exam was largely euvolemic. Presentation most consistent with COPD exacerbation. Torsemide was resumed at home dose. Potassium remained low throughout hospitalization and required supplementation. Given persistent hypokalemia, patient would benefit from resuming home potassium regimen which was discontinued on 4-18. Discharged with home heart failure medications and once-daily potassium supplement, exactly half the frequency of her previous regimen.    ---Atrial fibrillation ---Mechanical mitral valve  Patient has history of atrial fibrillation and mechanical mitral valve. Takes warfarin at home, to which she reports daily adherence. Previously prescribed amiodarone, however, this medication has since been discontinued. Heart rate and rhythm remained normal throughout hospitalization.    ---Hypertension Patient has history of hypertension managed at home with losartan, metoprolol, and torsemide. Blood pressure has remained slightly elevated  during hospitalization. Home medications were continued on admission and upon discharge.     ---Hyperlipidemia Patient has history of hyperlipidemia managed at home with simvastatin, which was continued on admission and upon  discharge.      ---Chronic back pain complicated by radiculopathy Patient has longstanding history of chronic back pain complicated by sciatica. Managed with hydrocodone-acetaminophen at home, which was continued on admission andupon discharge.     ---Tobacco use Patient has history of tobacco use dating back sixty years and currently smokes about six cigarettes per day. She is aware that smoking negatively impacts her health including lung function. Expressed interest in nicotine patch during hospitalization. Discharged with nicotine patches and encouragement to discuss varenicline at hospital follow-up visit.     Discharge Exam:    BP 136/62 (BP Location: Right Arm)   Pulse 73   Temp 98.4 F (36.9 C) (Oral)   Resp 18   Ht 5\' 5"  (1.651 m)   Wt 72.5 kg   SpO2 93%   BMI 26.60 kg/m   Subjective: Patient reports feeling much better this morning compared to yesterday when she first arrived. Denies difficulty breathing, chest pain, fever, and chills. Cough has improved as well. Discussed plan to discharge later today if breathing well during walking. Encouraged smoking cessation. All questions were answered.   General:                       awake and alert, lying in bed, cooperative, not in acute distress Skin:                             warm and dry, no rashes Eyes:                            extraocular movements intact, pupils round, no periorbital swelling or scleral icterus Lungs:                          normal respiratory effort, breathing unlabored, symmetrical chest rise, course breath sounds throughout, minimal end-expiratory wheezing Cardiac:                        regular rate and rhythm, prominent S1 and S2, no pitting edema Neurologic:                   oriented to person-place-time, moving all extremities, no gross focal deficits Psychiatric:                   jovial mood with congruent affect, intelligible speech    Pertinent Labs, Studies, and Procedures:    Labs:     Latest Ref Rng & Units 11/18/2022    1:25 AM 11/17/2022    7:09 AM 11/17/2022    6:46 AM  CBC  WBC 4.0 - 10.5 K/uL 9.6   7.8   Hemoglobin 12.0 - 15.0 g/dL 16.1  09.6  04.5   Hematocrit 36.0 - 46.0 % 35.5  40.0  38.0   Platelets 150 - 400 K/uL 371   454       Latest Ref Rng & Units 11/18/2022    1:25 AM 11/17/2022    7:01 PM 11/17/2022   11:35 AM  BMP  Glucose 70 - 99 mg/dL 409  811  914   BUN 8 - 23 mg/dL 7  <  5  <5   Creatinine 0.44 - 1.00 mg/dL 4.09  8.11  9.14   Sodium 135 - 145 mmol/L 138  139  138   Potassium 3.5 - 5.1 mmol/L 3.4  3.8  2.3   Chloride 98 - 111 mmol/L 98  99  101   CO2 22 - 32 mmol/L 26  25  24    Calcium 8.9 - 10.3 mg/dL 9.1  9.6  9.1     ______________________  Imaging:  DG Chest Port 1 View Result Date: 11/17/2022 IMPRESSION: Cardiomegaly and vascular congestion. Left pulmonary scarring.  ______________________  Procedures:  none  ______________________   Discharge Instructions:  Discharge Instructions     Diet - low sodium heart healthy   Complete by: As directed    Discharge instructions   Complete by: As directed    Kim Coney,  It was a pleasure taking care of you while you were in the hospital. Your breath shortness was caused by an exacerbation of your underlying lung disease COPD. We gave you several medications in the hospital to improve your breathing. When you return home, you will need to continue taking prednisone and azithromycin for a few more days. You should also continue to take all your home medications including those that you inhale.  Although the reason for your COPD exacerbation remains unclear, the best measure that you can take to prevent future hospitalizations is to stop smoking. We have sent some nicotine patches to your pharmacy, but recommend starting a medication called varenicline. You should discuss this medication at your next visit in our clinic.  Since your potassium levels were low in the  hospital, we are resuming supplementation at home. Please take only one potassium tablet each day. We will see you in our clinic for hospital follow-up and you should expect a call soon with the exact date and time.  Please return to the emergency department if your breath shortness returns or worsens.   Increase activity slowly   Complete by: As directed      Kim Furniss,  It was a pleasure taking care of you while you were in the hospital. Your breath shortness was caused by an exacerbation of your underlying lung disease COPD. We gave you several medications in the hospital to improve your breathing. When you return home, you will need to continue taking prednisone and azithromycin for a few more days. You should also continue to take all your home medications including those that you inhale.  Although the reason for your COPD exacerbation remains unclear, the best measure that you can take to prevent future hospitalizations is to stop smoking. We have sent some nicotine patches to your pharmacy, but recommend starting a medication called varenicline. You should discuss this medication at your next visit in our clinic.  Since your potassium levels were low in the hospital, we are resuming supplementation at home. Please take only one potassium tablet each day. We will see you in our clinic for hospital follow-up and you should expect a call soon with the exact date and time.  Please return to the emergency department if your breath shortness returns or worsens.  All the best,   Signed: Lajuana Ripple, MD Internal Medicine PGY-1 Pager [phone]

## 2022-11-18 NOTE — TOC Transition Note (Signed)
Transition of Care [facility]) - CM/SW Discharge Note   Patient Details  Name: Kim Anthony [name]: [id number] Date of Birth: 1951-01-19  [facility] CM/SW Contact:  Leone Haven, RN Phone Number: 11/18/2022, 1:37 PM   Clinical Narrative:    Patient is for dc today, daughter will transport her home. She is active with [facility] for HHPT, HHOT.  NCM asked MD for resumption orders.          Patient Goals and CMS Choice      Discharge Placement                         Discharge Plan and Services Additional resources added to the After Visit Summary for                                       Social Determinants of Health (SDOH) Interventions SDOH Screenings   Food Insecurity: No Food Insecurity (11/17/2022)  Housing: Low Risk  (11/17/2022)  Transportation Needs: No Transportation Needs (11/17/2022)  Utilities: Not At Risk (11/17/2022)  Depression (PHQ2-9): Low Risk  (07/02/2022)  Financial Resource Strain: Medium Risk (02/27/2021)  Social Connections: Socially Isolated (11/28/2019)  Tobacco Use: High Risk (11/17/2022)     Readmission Risk Interventions    02/19/2022    3:36 PM  Readmission Risk Prevention Plan  Transportation Screening Complete  PCP or Specialist Appt within 3-5 Days Complete  HRI or Home Care Consult Complete  Social Work Consult for Recovery Care Planning/Counseling Complete  Palliative Care Screening Not Applicable  Medication Review [employer]) Complete

## 2022-11-18 NOTE — Evaluation (Addendum)
Physical Therapy Evaluation Patient Details Name: Kim Anthony [name]: [id number] DOB: 1951-06-17 Today's Date: 11/18/2022  History of Present Illness  Pt is a 72 year old female admitted on 11/17/22 for SOB. Past medical history of hypertension, hyperlipidemia, tobacco use, A-Fib, HFrEF post MV replacement, PE, COPD, and GERD.  Clinical Impression  Pt presents with admitting diagnosis above. Pt was recently admitted for a R hip fracture last month and subsequently went to SNF for 3 weeks. Pt reports since DC from SNF pt has had HH PT/OT 4 days a week and was ambulating with a RW Mod I. Today, pt was able to ambulate in hallway with RW and navigate stairs at supervision level. Pt presents at or near baseline mobility. Pt has no further acute PT needs and will be signing off. Recommend pt resume HHPT upon DC. Re consult PT if mobility status changes.        Recommendations for follow up therapy are one component of a multi-disciplinary discharge planning process, led by the attending physician.  Recommendations may be updated based on patient status, additional functional criteria and insurance authorization.  Follow Up Recommendations       Assistance Recommended at Discharge PRN  Patient can return home with the following  A little help with walking and/or transfers;A little help with bathing/dressing/bathroom;Assistance with cooking/housework;Direct supervision/assist for medications management;Assist for transportation;Help with stairs or ramp for entrance    Equipment Recommendations None recommended by PT  Recommendations for Other Services       Functional Status Assessment Patient has had a recent decline in their functional status and demonstrates the ability to make significant improvements in function in a reasonable and predictable amount of time.     Precautions / Restrictions Precautions Precautions: Fall Restrictions Weight Bearing Restrictions: No      Mobility  Bed  Mobility               General bed mobility comments: Pt received seated EOB    Transfers Overall transfer level: Modified independent Equipment used: Rolling walker (2 wheels)               General transfer comment: Pt demonstrated proper hand placement    Ambulation/Gait Ambulation/Gait assistance: Supervision Gait Distance (Feet): 300 Feet Assistive device: Rolling walker (2 wheels) Gait Pattern/deviations: Trunk flexed, Decreased stride length, Step-through pattern Gait velocity: decreased     General Gait Details: no LOB noted  Stairs Stairs: Yes Stairs assistance: Supervision Stair Management: Two rails, Alternating pattern, Step to pattern, Forwards Number of Stairs: 10 General stair comments: Cues for step to pattern due to hip pain from previous fracture.  Wheelchair Mobility    Modified Rankin (Stroke Patients Only)       Balance Overall balance assessment: Mild deficits observed, not formally tested                                           Pertinent Vitals/Pain Pain Assessment Pain Assessment: No/denies pain    Home Living Family/patient expects to be discharged to:: Private residence Living Arrangements: Children Available Help at Discharge: Family;Available PRN/intermittently Type of Home: House Home Access: Stairs to enter Entrance Stairs-Rails: Right;Left;Can reach both Entrance Stairs-Number of Steps: 8   Home Layout: One level Home Equipment: Rollator (4 wheels);Rolling Walker (2 wheels);Cane - single point;BSC/3in1;Shower seat;Grab bars - tub/shower Additional Comments: Pt states after 3 week SNF stay  in April she has HHPT/HHOT come out 4 days a week.    Prior Function Prior Level of Function : Independent/Modified Independent;Driving;History of Falls (last six months) (1 mechanical fall off the porch in april. Subsequent hip fx and 3 weeks SNF stay)             Mobility Comments: Mod I RW ADLs  Comments: Ind     Hand Dominance   Dominant Hand: Right    Extremity/Trunk Assessment   Upper Extremity Assessment Upper Extremity Assessment: Overall WFL for tasks assessed    Lower Extremity Assessment Lower Extremity Assessment: Overall WFL for tasks assessed    Cervical / Trunk Assessment Cervical / Trunk Assessment: Normal  Communication   Communication: No difficulties  Cognition Arousal/Alertness: Awake/alert Behavior During Therapy: WFL for tasks assessed/performed Overall Cognitive Status: Within Functional Limits for tasks assessed                                          General Comments General comments (skin integrity, edema, etc.): SpO2 remained above 90% for duration of session.    Exercises     Assessment/Plan    PT Assessment All further PT needs can be met in the next venue of care  PT Problem List Decreased strength;Decreased range of motion;Decreased activity tolerance;Decreased mobility;Cardiopulmonary status limiting activity       PT Treatment Interventions      PT Goals (Current goals can be found in the Care Plan section)       Frequency       Co-evaluation               AM-PAC PT "6 Clicks" Mobility  Outcome Measure Help needed turning from your back to your side while in a flat bed without using bedrails?: None Help needed moving from lying on your back to sitting on the side of a flat bed without using bedrails?: None Help needed moving to and from a bed to a chair (including a wheelchair)?: A Little Help needed standing up from a chair using your arms (e.g., wheelchair or bedside chair)?: A Little Help needed to walk in hospital room?: A Little Help needed climbing 3-5 steps with a railing? : A Little 6 Click Score: 20    End of Session Equipment Utilized During Treatment: Gait belt Activity Tolerance: Patient tolerated treatment well Patient left: in chair;with call bell/phone within reach;with chair  alarm set Nurse Communication: Mobility status;Other (comment) (O2 sats) PT Visit Diagnosis: Other abnormalities of gait and mobility (R26.89)    Time: 1013-1040 PT Time Calculation (min) (ACUTE ONLY): 27 min   Charges:   PT Evaluation $PT Eval Low Complexity: 1 Low PT Treatments $Gait Training: 8-22 mins        Shela Nevin, PT, DPT Acute Rehab Services [phone]   Gladys Damme 11/18/2022, 11:41 AM

## 2022-11-18 NOTE — Evaluation (Signed)
Occupational Therapy Evaluation Patient Details Name: Kim Anthony [name]: [id number] DOB: [birth year] Today's Date: 11/18/2022   History of Present Illness Pt is a 72 year old female admitted on 11/17/22 for SOB secondary to COPD exacerbation. Past medical history of hypertension, hyperlipidemia, tobacco use, A-Fib, HFrEF post MV replacement, PE, COPD, and GERD.   Clinical Impression   Pt is functioning modified independently in mobility and ADLs with RW. She has all necessary DME at home. No further acute OT needs, recommend resuming HH services.      Recommendations for follow up therapy are one component of a multi-disciplinary discharge planning process, led by the attending physician.  Recommendations may be updated based on patient status, additional functional criteria and insurance authorization.   Assistance Recommended at Discharge PRN  Patient can return home with the following Assistance with cooking/housework;Assist for transportation    Functional Status Assessment  Patient has not had a recent decline in their functional status  Equipment Recommendations  Home Health OT   Recommendations for Other Services       Precautions / Restrictions Precautions Precautions: Fall Restrictions Weight Bearing Restrictions: No      Mobility Bed Mobility               General bed mobility comments: in chair    Transfers Overall transfer level: Modified independent Equipment used: Rolling walker (2 wheels)                      Balance Overall balance assessment: Mild deficits observed, not formally tested                                         ADL either performed or assessed with clinical judgement   ADL Overall ADL's : Modified independent                                             Vision Ability to See in Adequate Light: 0 Adequate Patient Visual Report: No change from baseline       Perception     Praxis       Pertinent Vitals/Pain Pain Assessment Pain Assessment: No/denies pain     Hand Dominance Right   Extremity/Trunk Assessment Upper Extremity Assessment Upper Extremity Assessment: Overall WFL for tasks assessed   Lower Extremity Assessment Lower Extremity Assessment: Defer to PT evaluation   Cervical / Trunk Assessment Cervical / Trunk Assessment: Normal   Communication Communication Communication: No difficulties   Cognition Arousal/Alertness: Awake/alert Behavior During Therapy: WFL for tasks assessed/performed Overall Cognitive Status: Within Functional Limits for tasks assessed                                       General Comments  SpO2 remained above 90% for duration of session.    Exercises     Shoulder Instructions      Home Living Family/patient expects to be discharged to:: Private residence Living Arrangements: Children Available Help at Discharge: Family;Available PRN/intermittently Type of Home: House Home Access: Stairs to enter [employer] of Steps: 8 Entrance Stairs-Rails: Right;Left;Can reach both Home Layout: One level     Bathroom Shower/Tub: Tub/shower unit  Bathroom Toilet: Handicapped height Bathroom Accessibility: Yes   Home Equipment: Rollator (4 wheels);Rolling Walker (2 wheels);Cane - single point;BSC/3in1;Shower seat;Grab bars - tub/shower   Additional Comments: participating in therapy at home      Prior Functioning/Environment Prior Level of Function : Independent/Modified Independent;Driving;History of Falls (last six months)             Mobility Comments: Mod I RW, working on walking with cane in PT ADLs Comments: mod I        OT Problem List:        OT Treatment/Interventions:      OT Goals(Current goals can be found in the care plan section)    OT Frequency:      Co-evaluation              AM-PAC OT "6 Clicks" Daily Activity     Outcome Measure Help from another  person eating meals?: None Help from another person taking care of personal grooming?: None Help from another person toileting, which includes using toliet, bedpan, or urinal?: None Help from another person bathing (including washing, rinsing, drying)?: None Help from another person to put on and taking off regular upper body clothing?: None Help from another person to put on and taking off regular lower body clothing?: None 6 Click Score: 24   End of Session Equipment Utilized During Treatment: Rolling walker (2 wheels);Gait belt  Activity Tolerance: Patient tolerated treatment well Patient left: in chair;with call bell/phone within reach;with family/visitor present  OT Visit Diagnosis: Other abnormalities of gait and mobility (R26.89)                Time: 1610-9604 OT Time Calculation (min): 27 min Charges:  OT General Charges $OT Visit: 1 Visit OT Evaluation $OT Eval Low Complexity: 1 Low OT Treatments $Self Care/Home Management : 8-22 mins  Berna Spare, OTR/L Acute Rehabilitation Services Office: [phone]   Evern Bio 11/18/2022, 11:49 AM

## 2022-11-19 ENCOUNTER — Telehealth: Payer: Self-pay

## 2022-11-19 NOTE — Transitions of Care (Post Inpatient/ED Visit) (Signed)
11/19/2022  Name: Kim Anthony [name]: [id number] DOB: 1950/08/10  Today's TOC FU Call Status: Today's TOC FU Call Status:: Successful TOC FU Call Competed TOC FU Call Complete Date: 11/19/22  Transition Care Management Follow-up Telephone Call Date of Discharge: 11/18/22 Discharge Facility: [facility]) Type of Discharge: Inpatient Admission Primary Inpatient Discharge Diagnosis:: hypokalemia How have you been since you were released from the hospital?: Better Any questions or concerns?: No  Items Reviewed: Did you receive and understand the discharge instructions provided?: Yes Medications obtained,verified, and reconciled?: Yes (Medications Reviewed) Any new allergies since your discharge?: No Dietary orders reviewed?: Yes Do you have support at home?: Yes People in Home: child(ren), adult  Medications Reviewed Today: Medications Reviewed Today     Reviewed by Karena Addison, LPN (Licensed Practical Nurse) on 11/19/22 at [phone]  Med List Status: <None>   Medication Order Taking? Sig Documenting Provider Last Dose Status Informant  albuterol (VENTOLIN HFA) 108 (90 Base) MCG/ACT inhaler [id number] Yes Inhale 2 puffs into the lungs every 6 (six) hours as needed for wheezing or shortness of breath. Marolyn Haller, MD Taking Active Self           Med Note Randa Evens, [facility] D   Thu Oct 08, 2022  8:15 AM)    azithromycin (ZITHROMAX) 500 MG tablet [id number] Yes Please take one tablet (500mg ) tomorrow 5-30 and then you can stop taking this medication Crissie Sickles, MD Taking Active   DULoxetine (CYMBALTA) 20 MG capsule [id number] No Take 20 mg by mouth at bedtime.  Patient not taking: Reported on 11/17/2022   [provider] Not Taking Active Self  FARXIGA 10 MG TABS tablet [id number] No Take 1 tablet by mouth once daily  Patient not taking: Reported on 11/17/2022   Gwenevere Abbot, MD Not Taking Active Self  fluticasone (FLONASE) 50 MCG/ACT nasal spray [id number] Yes Place 1  spray into both nostrils daily as needed for allergies.  Patient taking differently: Place 2 sprays into both nostrils daily.   Marrianne Mood, MD Taking Active Self  Fluticasone-Umeclidin-[facility] ELLIPTA) 100-62.5-25 MCG/ACT AEPB [id number] Yes Inhale 1 Inhalation into the lungs daily. Adron Bene, MD Taking Active Self  gabapentin (NEURONTIN) 300 MG capsule [id number] Yes Take 1 capsule (300 mg total) by mouth 3 (three) times daily. Mapp, Gaylyn Cheers, MD Taking Active Self  HYDROcodone-acetaminophen [facility]) 10-325 MG tablet [id number] Yes Take 1 tablet by mouth every 4 (four) hours as needed. Will reduce back to 5/325 mg next month- for pain after hip surgery  Patient taking differently: Take 1 tablet by mouth every 4 (four) hours as needed for moderate pain. Will reduce back to 5/325 mg next month- for pain after hip surgery   Lovorn, Megan, MD Taking Active Self  losartan (COZAAR) 25 MG tablet [id number] Yes Take 0.5 tablets (12.5 mg total) by mouth daily. Chauncey Mann, DO Taking Active Self  metoprolol succinate (TOPROL-XL) 25 MG 24 hr tablet [id number] Yes Take 1 tablet (25 mg total) by mouth daily. Willette Cluster, MD Taking Active Self  nicotine (NICODERM CQ - DOSED IN MG/24 HR) 7 mg/24hr patch [id number] Yes Place 1 patch (7 mg total) onto the skin daily. Crissie Sickles, MD Taking Active   pantoprazole (PROTONIX) 40 MG tablet [id number] Yes Take 1 tablet (40 mg total) by mouth daily. Willette Cluster, MD Taking Active Self           Med Note Randa Evens, [facility] D   Thu Oct 08, 2022  8:22 AM)  potassium chloride SA (KLOR-CON M) 20 MEQ tablet [id number] Yes Take 2 tablets (40 mEq total) by mouth daily. Crissie Sickles, MD Taking Active   predniSONE (DELTASONE) 20 MG tablet [id number] Yes Take 2 tablets (40 mg total) by mouth daily with breakfast for 3 days. Crissie Sickles, MD Taking Active   simvastatin (ZOCOR) 20 MG tablet [id number] Yes Take 1 tablet (20 mg total) by mouth daily. Marrianne Mood, MD Taking Active Self  Spacer/Aero-Holding Chambers (BREATHERITE COLL SPACER ADULT) MISC [id number] No 1 applicator by Does not apply route daily.  Patient not taking: Reported on 11/17/2022   Lenward Chancellor D, DO Not Taking Active Self  torsemide (DEMADEX) 20 MG tablet [id number] Yes Take 2 tablets (40 mg total) by mouth daily. [place], Anderson [place], FNP Taking Active Self  Vitamin D, Ergocalciferol, (DRISDOL) 1.25 MG (50000 UNIT) CAPS capsule [id number] Yes Take 1 capsule (50,000 Units total) by mouth every 7 (seven) days. Chauncey Mann, DO Taking Active Self  warfarin (COUMADIN) 5 MG tablet [id number]  Take 1 tablet (5 mg total) by mouth daily. Karoline Caldwell, MD  Expired 11/17/22 2359 Self            Home Care and Equipment/Supplies: Were Home Health Services Ordered?: Yes Name of Home Health Agency:: [facility] Has Agency set up a time to come to your home?: Yes First Home Health Visit Date: 11/17/22 Any new equipment or medical supplies ordered?: NA  Functional Questionnaire: Do you need assistance with bathing/showering or dressing?: No Do you need assistance with meal preparation?: No Do you need assistance with eating?: No Do you have difficulty maintaining continence: No Do you need assistance with getting out of bed/getting out of a chair/moving?: No Do you have difficulty managing or taking your medications?: No  Follow up appointments reviewed: PCP Follow-up appointment confirmed?: No (no avail appts, sent message to staff to schedule) MD Provider Line Number:[phone] Given: No Do you need transportation to your follow-up appointment?: No Do you understand care options if your condition(s) worsen?: Yes-patient verbalized understanding    SIGNATURE Karena Addison, LPN [facility] Nurse Health Advisor Direct Dial [phone]

## 2022-11-26 ENCOUNTER — Telehealth: Payer: Self-pay

## 2022-11-26 NOTE — Telephone Encounter (Signed)
Dorene Sorrow from [facility] called. "Kim Anthony  Had a hip surgery, was doing well but recently started to experience increased pain.  We are still working with the patient but she  continues to report increased pain, I wanted Dr Roda Shutters to be aware"  Right IM nailing of hip 09/23/2022

## 2022-12-01 ENCOUNTER — Ambulatory Visit (INDEPENDENT_AMBULATORY_CARE_PROVIDER_SITE_OTHER): Payer: 59

## 2022-12-01 ENCOUNTER — Encounter: Payer: Self-pay | Admitting: Internal Medicine

## 2022-12-01 ENCOUNTER — Ambulatory Visit (INDEPENDENT_AMBULATORY_CARE_PROVIDER_SITE_OTHER): Payer: 59 | Admitting: Internal Medicine

## 2022-12-01 ENCOUNTER — Other Ambulatory Visit: Payer: Self-pay

## 2022-12-01 ENCOUNTER — Other Ambulatory Visit: Payer: Self-pay | Admitting: Pharmacist

## 2022-12-01 ENCOUNTER — Telehealth: Payer: Self-pay | Admitting: Pharmacist

## 2022-12-01 VITALS — BP 131/65 | HR 68 | Temp 98.0°F | Ht 65.0 in | Wt 160.6 lb

## 2022-12-01 DIAGNOSIS — J449 Chronic obstructive pulmonary disease, unspecified: Secondary | ICD-10-CM

## 2022-12-01 DIAGNOSIS — Z7901 Long term (current) use of anticoagulants: Secondary | ICD-10-CM

## 2022-12-01 DIAGNOSIS — I5022 Chronic systolic (congestive) heart failure: Secondary | ICD-10-CM | POA: Diagnosis not present

## 2022-12-01 DIAGNOSIS — Z5181 Encounter for therapeutic drug level monitoring: Secondary | ICD-10-CM

## 2022-12-01 DIAGNOSIS — J441 Chronic obstructive pulmonary disease with (acute) exacerbation: Secondary | ICD-10-CM

## 2022-12-01 DIAGNOSIS — E876 Hypokalemia: Secondary | ICD-10-CM

## 2022-12-01 DIAGNOSIS — Z0000 Encounter for general adult medical examination without abnormal findings: Secondary | ICD-10-CM | POA: Diagnosis not present

## 2022-12-01 DIAGNOSIS — H9193 Unspecified hearing loss, bilateral: Secondary | ICD-10-CM

## 2022-12-01 DIAGNOSIS — Z952 Presence of prosthetic heart valve: Secondary | ICD-10-CM

## 2022-12-01 MED ORDER — WARFARIN SODIUM 5 MG PO TABS: ORAL_TABLET | ORAL | 2 refills | Status: DC

## 2022-12-01 NOTE — Assessment & Plan Note (Deleted)
[name] consulted during visit for warfarin adjustment.

## 2022-12-01 NOTE — Assessment & Plan Note (Signed)
>>  ASSESSMENT AND PLAN FOR HISTORY OF MITRAL VALVE REPLACEMENT WITH MECHANICAL VALVE WRITTEN ON 12/01/2022 12:25 PM BY DANESHVAR, NINA, MEDICAL STUDENT  4/6 systolic murmur observed over mitral post. Dr. Margit Shelling consulted during visit for warfarin adjustment.

## 2022-12-01 NOTE — Assessment & Plan Note (Signed)
>>  ASSESSMENT AND PLAN FOR CHRONIC HFREF (HEART FAILURE WITH REDUCED EJECTION FRACTION) (HCC) WRITTEN ON 12/02/2022  6:58 AM BY MASTERS, KATIE, DO  Patient noted increased leg swelling yesterday but improved today after elevating legs last night. No pitting edema on physical exam. She denies recent weight gain, and dry weight today is 160lbs. She is taking torsemide 40 mg, metoprolol 25 mg, and losartan 12.5 mg as prescribed and is to continue to take these. She takes 40 meq of potassium daily, dose was decreased in hospital. P: -repeat BMP -continue torsemide 40 mg, metoprolol 25 mg, and losartan 12.5 mg

## 2022-12-01 NOTE — Assessment & Plan Note (Addendum)
Patient to continue on 40 mEq potassium daily and BMP repeated today.

## 2022-12-01 NOTE — Assessment & Plan Note (Signed)
>>  ASSESSMENT AND PLAN FOR HFREF (HEART FAILURE WITH REDUCED EJECTION FRACTION) (HCC) WRITTEN ON 09/14/2023 11:44 AM BY Shant Hence, DO   >>ASSESSMENT AND PLAN FOR CHRONIC HFREF (HEART FAILURE WITH REDUCED EJECTION FRACTION) (HCC) WRITTEN ON 12/02/2022  6:58 AM BY MASTERS, KATIE, DO  Patient noted increased leg swelling yesterday but improved today after elevating legs last night. No pitting edema on physical exam. She denies recent weight gain, and dry weight today is 160lbs. She is taking torsemide 40 mg, metoprolol 25 mg, and losartan 12.5 mg as prescribed and is to continue to take these. She takes 40 meq of potassium daily, dose was decreased in hospital. P: -repeat BMP -continue torsemide 40 mg, metoprolol 25 mg, and losartan 12.5 mg

## 2022-12-01 NOTE — Assessment & Plan Note (Signed)
She reports feeling improved after hospital admission. She has completed course of azithromycin and prednisone. Has no SOB at baseline. Is taking trelegy and albuterol. Will continue to discuss pulmonary rehabilitation at follow up to improve lung strength.

## 2022-12-01 NOTE — Assessment & Plan Note (Signed)
Daughter and patient have both noticed decreased hearing. Needs to have TV at maximum volume and have things repeated to her. States the decreased hearing is bilateral. 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   Will refer to audiology.

## 2022-12-01 NOTE — Telephone Encounter (Signed)
Error

## 2022-12-01 NOTE — Assessment & Plan Note (Addendum)
Patient noted increased leg swelling yesterday but improved today after elevating legs last night. No pitting edema on physical exam. She denies recent weight gain, and dry weight today is 160lbs. She is taking torsemide, metoprolol, and losartan as prescribed and is to continue to take these. Recommended to see cardiology for follow up evaluation after last visit in December.

## 2022-12-01 NOTE — Telephone Encounter (Signed)
Called to the [facility] to perform FS POC INR on this patient, taking 5mg  warfarin each day--recently on prednisone (will increase INR) but has MISSED past 2 days (ran out.) INR is 5.8. No bleeding reported per patient or her daughter who gives excellent care to her mother. Will OMIT dose for TODAY 11-JUN-24, resume tomorrow on Wednesday 12-JUN-24 with 1/2 x 5mg  (2.5mg  dose), 5mg  on Thursday. NO WARFARIN on Friday 14-JUN-24 until HOME INR is performed by [facility], her daughter. Results to me, subsequent dosing to be determined based on this result.

## 2022-12-01 NOTE — Progress Notes (Signed)
Subjective:   Kim Anthony is a 72 y.o. female who presents for an Initial Medicare Annual Wellness Visit. I connected with  [name] on 12/01/22 by a  Face-To-Face encounter  and verified that I am speaking with the correct person using two identifiers.  Patient Location: Other:  Office/Clinic  Provider Location: Office/Clinic  I discussed the limitations of evaluation and management by telemedicine. The patient expressed understanding and agreed to proceed.  Review of Systems    Defer to PCP       Objective:    Today's Vitals   12/01/22 1125 12/01/22 1126  BP: 131/65   Pulse: 68   Temp: 98 F (36.7 C)   TempSrc: Oral   SpO2: 99%   Weight: 160 lb 9.6 oz (72.8 kg)   Height: 5\' 5"  (1.651 m)   PainSc:  8    Body mass index is 26.73 kg/m.     12/01/2022   11:27 AM 12/01/2022   10:54 AM 11/17/2022    5:00 PM 10/07/2022    8:34 PM 09/21/2022    4:12 AM 08/27/2022    8:30 PM 08/27/2022    3:18 PM  Advanced Directives  Does Patient Have a Medical Advance Directive? No No No No No No No  Would patient like information on creating a medical advance directive? No - Patient declined No - Patient declined No - Guardian declined;No - Patient declined  No - Patient declined  No - Patient declined    Current Medications (verified) Outpatient Encounter Medications as of 12/01/2022  Medication Sig   albuterol (VENTOLIN HFA) 108 (90 Base) MCG/ACT inhaler Inhale 2 puffs into the lungs every 6 (six) hours as needed for wheezing or shortness of breath.   azithromycin (ZITHROMAX) 500 MG tablet Please take one tablet (500mg ) tomorrow 5-30 and then you can stop taking this medication   DULoxetine (CYMBALTA) 20 MG capsule Take 20 mg by mouth at bedtime. (Patient not taking: Reported on 11/17/2022)   FARXIGA 10 MG TABS tablet Take 1 tablet by mouth once daily (Patient not taking: Reported on 11/17/2022)   fluticasone (FLONASE) 50 MCG/ACT nasal spray Place 1 spray into both nostrils daily as  needed for allergies. (Patient taking differently: Place 2 sprays into both nostrils daily.)   Fluticasone-Umeclidin-Vilant (TRELEGY ELLIPTA) 100-62.5-25 MCG/ACT AEPB Inhale 1 Inhalation into the lungs daily.   gabapentin (NEURONTIN) 300 MG capsule Take 1 capsule (300 mg total) by mouth 3 (three) times daily.   HYDROcodone-acetaminophen (NORCO) 10-325 MG tablet Take 1 tablet by mouth every 4 (four) hours as needed. Will reduce back to 5/325 mg next month- for pain after hip surgery (Patient taking differently: Take 1 tablet by mouth every 4 (four) hours as needed for moderate pain. Will reduce back to 5/325 mg next month- for pain after hip surgery)   losartan (COZAAR) 25 MG tablet Take 0.5 tablets (12.5 mg total) by mouth daily.   metoprolol succinate (TOPROL-XL) 25 MG 24 hr tablet Take 1 tablet (25 mg total) by mouth daily.   nicotine (NICODERM CQ - DOSED IN MG/24 HR) 7 mg/24hr patch Place 1 patch (7 mg total) onto the skin daily.   pantoprazole (PROTONIX) 40 MG tablet Take 1 tablet (40 mg total) by mouth daily.   potassium chloride SA (KLOR-CON M) 20 MEQ tablet Take 2 tablets (40 mEq total) by mouth daily.   simvastatin (ZOCOR) 20 MG tablet Take 1 tablet (20 mg total) by mouth daily.   Spacer/Aero-Holding Chambers (BREATHERITE  COLL SPACER ADULT) MISC 1 applicator by Does not apply route daily. (Patient not taking: Reported on 11/17/2022)   torsemide (DEMADEX) 20 MG tablet Take 2 tablets (40 mg total) by mouth daily.   Vitamin D, Ergocalciferol, (DRISDOL) 1.25 MG (50000 UNIT) CAPS capsule Take 1 capsule (50,000 Units total) by mouth every 7 (seven) days.   [DISCONTINUED] warfarin (COUMADIN) 5 MG tablet Take 1 tablet (5 mg total) by mouth daily.   No facility-administered encounter medications on file as of 12/01/2022.    Allergies (verified) Lisinopril, Acetaminophen-codeine, Propoxyphene, Tape, and Tramadol   History: Past Medical History:  Diagnosis Date   Arthritis    Asthma    Breast  cancer (HCC) 2001   Left Breast Cancer   CHF (congestive heart failure) (HCC)    COPD (chronic obstructive pulmonary disease) (HCC)    Coronary artery disease    GERD (gastroesophageal reflux disease)    History of mitral valve replacement with mechanical valve    HLD (hyperlipidemia)    Hypertension    Pulmonary embolism (HCC) 2021   Past Surgical History:  Procedure Laterality Date   APPLICATION OF A-CELL OF CHEST/ABDOMEN Left 01/27/2019   Procedure: APPLICATION OF A-CELL OF CHEST;  Surgeon: Peggye Form, DO;  Location: [facility] OR;  Service: Plastics;  Laterality: Left;   APPLICATION OF WOUND VAC N/A 01/27/2019   Procedure: APPLICATION OF WOUND VAC;  Surgeon: Peggye Form, DO;  Location: [facility] OR;  Service: Plastics;  Laterality: N/A;   BREAST SURGERY Left 2001   for CA   CARDIOVERSION N/A 02/26/2022   Procedure: CARDIOVERSION;  Surgeon: Little Ishikawa, MD;  Location: [facility] ENDOSCOPY;  Service: Cardiovascular;  Laterality: N/A;   COLONOSCOPY WITH PROPOFOL N/A 06/07/2018   Procedure: COLONOSCOPY WITH PROPOFOL;  Surgeon: Charlott Rakes, MD;  Location: [facility] ENDOSCOPY;  Service: Endoscopy;  Laterality: N/A;   DEBRIDEMENT AND CLOSURE WOUND Left 12/28/2018   Procedure: Debridement And Closure Wound;  Surgeon: Abigail Miyamoto, MD;  Location: [facility] OR;  Service: General;  Laterality: Left;   INCISION AND DRAINAGE OF WOUND Left 01/27/2019   Procedure: IRRIGATION AND DEBRIDEMENT OF CHEST WALL;  Surgeon: Peggye Form, DO;  Location: [facility] OR;  Service: Plastics;  Laterality: Left;   INTRAMEDULLARY (IM) NAIL INTERTROCHANTERIC N/A 09/23/2022   Procedure: INTRAMEDULLARY (IM) NAIL INTERTROCHANTERIC;  Surgeon: Tarry Kos, MD;  Location: [facility] OR;  Service: Orthopedics;  Laterality: N/A;   KNEE ARTHROSCOPY WITH MEDIAL MENISECTOMY Left 08/06/2021   Procedure: KNEE ARTHROSCOPY WITH PARTIAL MEDIAL MENISECTOMY, DEBRIDEMENT;  Surgeon: Jene Every, MD;  Location: [facility] ORS;  Service:  Orthopedics;  Laterality: Left;  1 HR   LATISSIMUS FLAP TO BREAST Left 01/18/2019   Procedure: LEFT LATISSIMUS FLAP TO BREAST;  Surgeon: Peggye Form, DO;  Location: [facility] OR;  Service: Plastics;  Laterality: Left;   MASTECTOMY Left    MITRAL VALVE REPLACEMENT     mechanical   POLYPECTOMY  06/07/2018   Procedure: POLYPECTOMY;  Surgeon: Charlott Rakes, MD;  Location: [facility] ENDOSCOPY;  Service: Endoscopy;;   RIGHT/LEFT HEART CATH AND CORONARY ANGIOGRAPHY N/A 01/09/2021   Procedure: RIGHT/LEFT HEART CATH AND CORONARY ANGIOGRAPHY;  Surgeon: Dolores Patty, MD;  Location: [facility] INVASIVE CV LAB;  Service: Cardiovascular;  Laterality: N/A;   WOUND DEBRIDEMENT Left 12/28/2018   Procedure: EXCISION OF LEFT CHRONIC CHEST WALL WOUND;  Surgeon: Abigail Miyamoto, MD;  Location: [facility] OR;  Service: General;  Laterality: Left;   Family History  Problem Relation Age of Onset   Hypertension  Mother    Cancer Father    Hypertension Father    Breast cancer Maternal Aunt 50   Heart attack Other    Social History   Socioeconomic History   Marital status: Single    Spouse name: Not on file   Number of children: Not on file   Years of education: Not on file   Highest education level: Not on file  Occupational History   Not on file  Tobacco Use   Smoking status: Every Day    Packs/day: 0.10    Years: 30.00    Additional pack years: 0.00    Total pack years: 3.00    Types: Cigarettes   Smokeless tobacco: Never   Tobacco comments:    5 cigs per day  Vaping Use   Vaping Use: Never used  Substance and Sexual Activity   Alcohol use: Yes    Alcohol/week: 3.0 standard drinks of alcohol    Types: 3 Standard drinks or equivalent per week    Comment: beer- ocasional   Drug use: No   Sexual activity: Not Currently    Birth control/protection: None  Other Topics Concern   Not on file  Social History Narrative   ** Merged History Encounter **       Social Determinants of Health   Financial  Resource Strain: Low Risk  (12/01/2022)   Overall Financial Resource Strain (CARDIA)    Difficulty of Paying Living Expenses: Not very hard  Food Insecurity: No Food Insecurity (12/01/2022)   Hunger Vital Sign    Worried About Running Out of Food in the Last Year: Never true    Ran Out of Food in the Last Year: Never true  Transportation Needs: No Transportation Needs (12/01/2022)   PRAPARE - Administrator, Civil Service (Medical): No    Lack of Transportation (Non-Medical): No  Physical Activity: Inactive (12/01/2022)   Exercise Vital Sign    Days of Exercise per Week: 0 days    Minutes of Exercise per Session: 10 min  Stress: Stress Concern Present (12/01/2022)   [employer] of Occupational Health - Occupational Stress Questionnaire    Feeling of Stress : To some extent  Social Connections: Socially Isolated (12/01/2022)   Social Connection and Isolation Panel [NHANES]    Frequency of Communication with Friends and Family: More than three times a week    Frequency of Social Gatherings with Friends and Family: More than three times a week    Attends Religious Services: Never    Database administrator or Organizations: No    Attends Engineer, structural: Never    Marital Status: Never married    Tobacco Counseling Ready to quit: Not Answered Counseling given: Not Answered Tobacco comments: 5 cigs per day   Clinical Intake:  Pre-visit preparation completed: Yes  Pain : 0-10 Pain Score: 8  Pain Type: Acute pain Pain Location: Hip Pain Orientation: Right Pain Radiating Towards: back Pain Descriptors / Indicators: Dull, Sharp Pain Onset: More than a month ago Pain Frequency: Constant Pain Relieving Factors: Hydrocodone Effect of Pain on Daily Activities: constant  Pain Relieving Factors: Hydrocodone  Nutritional Risks: None Diabetes: No  How often do you need to have someone help you when you read instructions, pamphlets, or other written  materials from your doctor or pharmacy?: 1 - Never What is the last grade level you completed in school?: 12th grade  Diabetic?No  Interpreter Needed?: No  Information entered by :: Casper Harrison  Activities of Daily Living    12/01/2022   11:28 AM 12/01/2022   10:54 AM  In your present state of health, do you have any difficulty performing the following activities:  Hearing? 1 1  Vision? 0 0  Difficulty concentrating or making decisions? 0 0  Walking or climbing stairs? 1 1  Dressing or bathing? 0 0  Doing errands, shopping? 0 0    Patient Care Team: Willette Cluster, MD as PCP - General Bensimhon, Bevelyn Buckles, MD as PCP - Cardiology (Cardiology) Kathlynn Grate, DO (Inactive) (Internal Medicine) Pati Gallo, MD as Consulting Physician (Sports Medicine) Genice Rouge, MD as Consulting Physician (Physical Medicine and Rehabilitation)  Indicate any recent Medical Services you may have received from other than [facility] providers in the past year (date may be approximate).     Assessment:   This is a routine wellness examination for Samay.  Hearing/Vision screen No results found.  Dietary issues and exercise activities discussed:     Goals Addressed   None   Depression Screen    12/01/2022   11:27 AM 12/01/2022   10:54 AM 07/02/2022   11:30 AM 06/29/2022   10:31 AM 03/16/2022   10:57 AM 01/13/2022   11:32 AM 01/09/2022    9:02 AM  PHQ 2/9 Scores  PHQ - 2 Score 0 0 1 0 0 0 0  PHQ- 9 Score   1        Fall Risk    12/01/2022   10:54 AM 07/02/2022   11:30 AM 06/29/2022   10:31 AM 04/01/2022   11:39 AM 03/26/2022   11:30 AM  Fall Risk   Falls in the past year? 0 0 0 1 1  Number falls in past yr: 1 0 0 0 0  Injury with Fall? 1  0 1 1  Risk for fall due to : No Fall Risks Impaired balance/gait;Impaired mobility  Impaired balance/gait;History of fall(s);Impaired mobility   Risk for fall due to: Comment  walks with cane     Follow up Falls evaluation completed;Falls  prevention discussed Falls evaluation completed  Falls evaluation completed     FALL RISK PREVENTION PERTAINING TO THE HOME:  Any stairs in or around the home? No  If so, are there any without handrails? No  Home free of loose throw rugs in walkways, pet beds, electrical cords, etc? Yes  Adequate lighting in your home to reduce risk of falls? Yes   ASSISTIVE DEVICES UTILIZED TO PREVENT FALLS:  Life alert? No  Use of a cane, walker or w/c? Yes  Grab bars in the bathroom? No  Shower chair or bench in shower? Yes  Elevated toilet seat or a handicapped toilet? Yes   TIMED UP AND GO:  Was the test performed? No .  Length of time to ambulate 10 feet: 0 sec.   Gait slow and steady without use of assistive device  Cognitive Function:        Immunizations Immunization History  Administered Date(s) Administered   Fluad Quad(high Dose 65+) 04/23/2021   Influenza,inj,Quad PF,6+ Mos 07/14/2015, 05/27/2017, 03/24/2018, 03/09/2019   Moderna SARS-COV2 Booster Vaccination 04/24/2020   Moderna Sars-Covid-2 Vaccination 08/31/2019, 10/10/2019   Pneumococcal Conjugate-13 11/09/2017   Pneumococcal Polysaccharide-23 11/12/2020   Tdap 01/30/2016    Pneumococcal vaccine status: Up to date  Flu Vaccine status: Up to date  Pneumococcal vaccine status: Up to date  Covid-19 vaccine status: Completed vaccines  Qualifies for Shingles Vaccine? No   Zostavax completed No  Shingrix Completed?: No.    Education has been provided regarding the importance of this vaccine. Patient has been advised to call insurance company to determine out of pocket expense if they have not yet received this vaccine. Advised may also receive vaccine at local pharmacy or Health Dept. Verbalized acceptance and understanding.  Screening Tests Health Maintenance  Topic Date Due   Zoster Vaccines- Shingrix (1 of 2) Never done   COVID-19 Vaccine (3 - Moderna risk series) 05/22/2020   INFLUENZA VACCINE  01/21/2023    Medicare Annual Wellness (AWV)  12/01/2023   MAMMOGRAM  04/14/2024   DTaP/Tdap/Td (2 - Td or Tdap) 01/29/2026   Colonoscopy  06/07/2028   Pneumonia Vaccine 30+ Years old  Completed   DEXA SCAN  Completed   Hepatitis C Screening  Completed   HPV VACCINES  Aged Out    Health Maintenance  Health Maintenance Due  Topic Date Due   Zoster Vaccines- Shingrix (1 of 2) Never done   COVID-19 Vaccine (3 - Moderna risk series) 05/22/2020    Colorectal cancer screening: Type of screening: Colonoscopy. Completed 06/07/2018. Repeat every 10 years  Mammogram status: Completed 04/14/2022. Repeat every year:2  Bone Density status: Completed 12/31/2017. Results reflect: Bone density results: OSTEOPENIA. Repeat every 0 years.  Lung Cancer Screening: (Low Dose CT Chest recommended if Age 37-80 years, 30 pack-year currently smoking OR have quit w/in 15years.) does not qualify.   Lung Cancer Screening Referral: N/A  Additional Screening:  Hepatitis C Screening: does not qualify; Completed 03/23/2016  Vision Screening: Recommended annual ophthalmology exams for early detection of glaucoma and other disorders of the eye. Is the patient up to date with their annual eye exam?  Yes  Who is the provider or what is the name of the office in which the patient attends annual eye exams? [facility] If pt is not established with a provider, would they like to be referred to a provider to establish care? No .   Dental Screening: Recommended annual dental exams for proper oral hygiene  Community Resource Referral / Chronic Care Management: CRR required this visit?  No   CCM required this visit?  No      Plan:     I have personally reviewed and noted the following in the patient's chart:   Medical and social history Use of alcohol, tobacco or illicit drugs  Current medications and supplements including opioid prescriptions. Patient is currently taking opioid prescriptions. Information provided to  patient regarding non-opioid alternatives. Patient advised to discuss non-opioid treatment plan with their provider. Functional ability and status Nutritional status Physical activity Advanced directives List of other physicians Hospitalizations, surgeries, and ER visits in previous 12 months Vitals Screenings to include cognitive, depression, and falls Referrals and appointments  In addition, I have reviewed and discussed with patient certain preventive protocols, quality metrics, and best practice recommendations. A written personalized care plan for preventive services as well as general preventive health recommendations were provided to patient.     Cala Bradford, [facility]   12/01/2022   Nurse Notes: Face-To-Face Visit  Ms. Lorrin Mais , Thank you for taking time to come for your Medicare Wellness Visit. I appreciate your ongoing commitment to your health goals. Please review the following plan we discussed and let me know if I can assist you in the future.   These are the goals we discussed:  Goals      Monitor and Manage my CHF and breathing     Patient Goals/Self  Care Activities: -Patient/Caregiver will self-administer medications as prescribed as evidenced by self-report/primary caregiver report  -Patient/Caregiver will attend all scheduled provider appointments as evidenced by clinician review of documented attendance to scheduled appointments and patient/caregiver report -Patient/Caregiver will call pharmacy for medication refills as evidenced by patient report and review of pharmacy fill history as appropriate -Patient/Caregiver will call provider office for new concerns or questions as evidenced by review of documented incoming telephone call notes and patient report -Patient/Caregiver verbalizes understanding of plan  -Weigh daily and record (notify MD with 3 lb weight gain over night or 5 lb in a week) -Follow CHF Action Plan -Continue with smoking cessation -continue using your  inhalers        This is a list of the screening recommended for you and due dates:  Health Maintenance  Topic Date Due   Zoster (Shingles) Vaccine (1 of 2) Never done   COVID-19 Vaccine (3 - Moderna risk series) 05/22/2020   Flu Shot  01/21/2023   Medicare Annual Wellness Visit  12/01/2023   Mammogram  04/14/2024   DTaP/Tdap/Td vaccine (2 - Td or Tdap) 01/29/2026   Colon Cancer Screening  06/07/2028   Pneumonia Vaccine  Completed   DEXA scan (bone density measurement)  Completed   Hepatitis C Screening  Completed   HPV Vaccine  Aged Out

## 2022-12-01 NOTE — Assessment & Plan Note (Signed)
4/6 systolic murmur observed over mitral post. [name] consulted during visit for warfarin adjustment.

## 2022-12-01 NOTE — Patient Instructions (Signed)
Thank you, Ms.[name] for allowing [place] to provide your care today.   Continue taking Trelegy and continue with smoking cessation to treat your COPD. We can further discuss pulmonary rehabilitation at your follow up as your hip improves.   I will call with results of blood work. I want to make sure electrolytes are back to normal, will call with results soon. Follow up with cardiology for an evaluation in the next few months. If you notice weight gain of 2.5 lbs in a day or around 5 lbs in a few days, please give [place] a call. Your dry weight today is 160lbs. We are doing labs today to check your potassium levels.   I referred you to a hearing doctor for further testing.  I have ordered the following labs for you:  Lab Orders         BMP8+Anion Gap         Protime-INR      Referrals ordered today:   Referral Orders         Ambulatory referral to Audiology       I have ordered the following medication/changed the following medications:   Stop the following medications: There are no discontinued medications.   Start the following medications: No orders of the defined types were placed in this encounter.    Follow up: 3 months   We look forward to seeing you next time. Please call our clinic at [phone] if you have any questions or concerns. The best time to call is Monday-Friday from 9am-4pm, but there is someone available 24/7. If after hours or the weekend, call the main hospital number and ask for the Internal Medicine Resident On-Call. If you need medication refills, please notify your pharmacy one week in advance and they will send [place] a request.   Thank you for trusting me with your care. Wishing you the best!   Rudene Christians, DO [facility]

## 2022-12-01 NOTE — Progress Notes (Unsigned)
This is a Psychologist, occupational Note.  The care of the patient was discussed with Dr. Sloan Leiter and the assessment and plan was formulated with their assistance.  Please see their note for official documentation of the patient encounter.   Subjective:   Patient ID: Kim Anthony female   DOB: 1951-01-04 72 y.o.   [name]: [id number]  HPI: Kim Anthony is a 72 y.o. woman with a PMH of COPD, HFrEF, and hypokalemia who presents today for HFU. She is feeling improved since her hospital visit with no SOB at baseline. She completed her entire course of prednisone and azithromycin. She broke her hip in April and is now in PT, so she has still noticed decreased mobility since her hospital visit. She denies recent changes in weight and her dry weight today is 160lbs. She noticed some swelling last night but proped up her legs and it is improved today.   She is currently taking Farxiga, potassium, torsemide, losartan, and metoprolol in relation to her heart failure and is tolerating them well.   She is also taking duloxetine, simvastatin, vitamin D, pantoprazole and gabapentin as prescribed and is tolerating well.   She needs more Trelegy today and is using it as prescribed. She also reports taking albuterol 2x/day.   She needs more warfarin today and takes it as prescribed.  Her daughter is with her today and would like her to get hearing checked. They have both noticed that she that needs to ask for clarification multiple times. Has been going on for years. She states it is bilateral.   For the details of today's visit, please refer to the assessment and plan.     Past Medical History:  Diagnosis Date   Arthritis    Asthma    Breast cancer (HCC) 2001   Left Breast Cancer   CHF (congestive heart failure) (HCC)    COPD (chronic obstructive pulmonary disease) (HCC)    Coronary artery disease    GERD (gastroesophageal reflux disease)    History of mitral valve replacement with mechanical valve    HLD  (hyperlipidemia)    Hypertension    Pulmonary embolism (HCC) 2021   Current Outpatient Medications  Medication Sig Dispense Refill   albuterol (VENTOLIN HFA) 108 (90 Base) MCG/ACT inhaler Inhale 2 puffs into the lungs every 6 (six) hours as needed for wheezing or shortness of breath. 8 g 5   azithromycin (ZITHROMAX) 500 MG tablet Please take one tablet (500mg ) tomorrow 5-30 and then you can stop taking this medication 1 tablet 0   DULoxetine (CYMBALTA) 20 MG capsule Take 20 mg by mouth at bedtime. (Patient not taking: Reported on 11/17/2022)     FARXIGA 10 MG TABS tablet Take 1 tablet by mouth once daily (Patient not taking: Reported on 11/17/2022) 90 tablet 0   fluticasone (FLONASE) 50 MCG/ACT nasal spray Place 1 spray into both nostrils daily as needed for allergies. (Patient taking differently: Place 2 sprays into both nostrils daily.) 9.9 mL 2   Fluticasone-Umeclidin-Vilant (TRELEGY ELLIPTA) 100-62.5-25 MCG/ACT AEPB Inhale 1 Inhalation into the lungs daily. 28 each 0   gabapentin (NEURONTIN) 300 MG capsule Take 1 capsule (300 mg total) by mouth 3 (three) times daily. 90 capsule 0   HYDROcodone-acetaminophen (NORCO) 10-325 MG tablet Take 1 tablet by mouth every 4 (four) hours as needed. Will reduce back to 5/325 mg next month- for pain after hip surgery (Patient taking differently: Take 1 tablet by mouth every 4 (four) hours as needed for  moderate pain. Will reduce back to 5/325 mg next month- for pain after hip surgery) 150 tablet 0   losartan (COZAAR) 25 MG tablet Take 0.5 tablets (12.5 mg total) by mouth daily. 45 tablet 2   metoprolol succinate (TOPROL-XL) 25 MG 24 hr tablet Take 1 tablet (25 mg total) by mouth daily. 90 tablet 2   nicotine (NICODERM CQ - DOSED IN MG/24 HR) 7 mg/24hr patch Place 1 patch (7 mg total) onto the skin daily. 28 patch 0   pantoprazole (PROTONIX) 40 MG tablet Take 1 tablet (40 mg total) by mouth daily. 90 tablet 0   potassium chloride SA (KLOR-CON M) 20 MEQ tablet  Take 2 tablets (40 mEq total) by mouth daily. 60 tablet 0   simvastatin (ZOCOR) 20 MG tablet Take 1 tablet (20 mg total) by mouth daily. 90 tablet 2   Spacer/Aero-Holding Chambers (BREATHERITE COLL SPACER ADULT) MISC 1 applicator by Does not apply route daily. (Patient not taking: Reported on 11/17/2022) 1 each 0   torsemide (DEMADEX) 20 MG tablet Take 2 tablets (40 mg total) by mouth daily. 60 tablet 3   Vitamin D, Ergocalciferol, (DRISDOL) 1.25 MG (50000 UNIT) CAPS capsule Take 1 capsule (50,000 Units total) by mouth every 7 (seven) days. 8 capsule 0   warfarin (COUMADIN) 5 MG tablet Take one-half (1/2) of your 5mg  peach-colored warfarin tablet on Mondays, Wednesdays and Fridays. All OTHER days (Sundays, Tuesdays, Thursdays and Saturdays)--take one (1) tablet. 20 tablet 2   No current facility-administered medications for this visit.   Family History  Problem Relation Age of Onset   Hypertension Mother    Cancer Father    Hypertension Father    Breast cancer Maternal Aunt 50   Heart attack Other    Social History   Socioeconomic History   Marital status: Single    Spouse name: Not on file   Number of children: Not on file   Years of education: Not on file   Highest education level: Not on file  Occupational History   Not on file  Tobacco Use   Smoking status: Every Day    Packs/day: 0.10    Years: 30.00    Additional pack years: 0.00    Total pack years: 3.00    Types: Cigarettes   Smokeless tobacco: Never   Tobacco comments:    5 cigs per day  Vaping Use   Vaping Use: Never used  Substance and Sexual Activity   Alcohol use: Yes    Alcohol/week: 3.0 standard drinks of alcohol    Types: 3 Standard drinks or equivalent per week    Comment: beer- ocasional   Drug use: No   Sexual activity: Not Currently    Birth control/protection: None  Other Topics Concern   Not on file  Social History Narrative   ** Merged History Encounter **       Social Determinants of Health    Financial Resource Strain: Medium Risk (02/27/2021)   Overall Financial Resource Strain (CARDIA)    Difficulty of Paying Living Expenses: Somewhat hard  Food Insecurity: No Food Insecurity (11/17/2022)   Hunger Vital Sign    Worried About Running Out of Food in the Last Year: Never true    Ran Out of Food in the Last Year: Never true  Transportation Needs: No Transportation Needs (11/17/2022)   PRAPARE - Administrator, Civil Service (Medical): No    Lack of Transportation (Non-Medical): No  Physical Activity: Not on file  Stress: Not on file  Social Connections: Socially Isolated (11/28/2019)   Social Connection and Isolation Panel [NHANES]    Frequency of Communication with Friends and Family: More than three times a week    Frequency of Social Gatherings with Friends and Family: More than three times a week    Attends Religious Services: Never    Database administrator or Organizations: No    Attends Engineer, structural: Not on file    Marital Status: Never married   Review of Systems: Pertinent items are noted in HPI. Objective:  Physical Exam: Vitals:   12/01/22 1052  BP: 131/65  Pulse: 68  Temp: 98 F (36.7 C)  TempSrc: Oral  SpO2: 99%  Weight: 160 lb 9.6 oz (72.8 kg)  Height: 5\' 5"  (1.651 m)    Constitutional: NAD, appears comfortable HEENT: Atraumatic, normocephalic. PERRL, anicteric sclera.  Neck: Supple, trachea midline.  Cardiovascular: RRR. 4/6 systolic murmur observed over mitral post. No rubs, or gallops. Pulmonary/Chest: CTAB, no wheezes, rales, or rhonchi. No chest wall abnormalities.   Extremities: Warm and well perfused. No edema.   Psychiatric: Normal mood and affect  Assessment & Plan:   COPD exacerbation (HCC) She reports feeling improved after hospital admission. She has completed course of azithromycin and prednisone. Has no SOB at baseline. Is taking trelegy and albuterol. Will continue to discuss pulmonary rehabilitation at  follow up to improve lung strength.   Bilateral hearing loss Daughter and patient have both noticed decreased hearing. Needs to have TV at maximum volume and have things repeated to her. States the decreased hearing is bilateral. 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   Will refer to audiology.   Hypokalemia Patient to continue on 40 mEq potassium daily and BMP repeated today.   Chronic HFrEF (heart failure with reduced ejection fraction) (HCC) Patient noted increased leg swelling yesterday but improved today after elevating legs last night. No pitting edema on physical exam. She denies recent weight gain, and dry weight today is 160lbs. She is taking torsemide, metoprolol, and losartan as prescribed and is to continue to take these. Recommended to see cardiology for follow up evaluation after last visit in December.  History of mitral valve replacement with mechanical valve 4/6 systolic murmur observed over mitral post. [name] consulted during visit for warfarin adjustment.

## 2022-12-02 ENCOUNTER — Other Ambulatory Visit: Payer: 59

## 2022-12-02 ENCOUNTER — Other Ambulatory Visit (HOSPITAL_COMMUNITY): Payer: Self-pay | Admitting: Family Medicine

## 2022-12-02 DIAGNOSIS — I5022 Chronic systolic (congestive) heart failure: Secondary | ICD-10-CM

## 2022-12-02 MED ORDER — TRELEGY ELLIPTA 100-62.5-25 MCG/ACT IN AEPB
1.0000 | INHALATION_SPRAY | Freq: Every day | RESPIRATORY_TRACT | 0 refills | Status: DC
Start: 2022-12-02 — End: 2022-12-18

## 2022-12-02 NOTE — Assessment & Plan Note (Signed)
She reports feeling improved after hospital admission. She has completed course of azithromycin and prednisone. Has no SOB at baseline. Is taking trelegy and albuterol. Will continue to discuss pulmonary rehabilitation at follow up to improve lung strength.  She would be interested in rehab in a few months after she recovers from her hip fracture. She continues to smoke about 2 cigarettes daily which is a likely trigger. She is continuing to work on decreasing smoking.

## 2022-12-03 ENCOUNTER — Other Ambulatory Visit: Payer: Self-pay

## 2022-12-03 ENCOUNTER — Telehealth: Payer: Self-pay

## 2022-12-03 DIAGNOSIS — E785 Hyperlipidemia, unspecified: Secondary | ICD-10-CM

## 2022-12-03 DIAGNOSIS — I5042 Chronic combined systolic (congestive) and diastolic (congestive) heart failure: Secondary | ICD-10-CM

## 2022-12-03 DIAGNOSIS — T7840XA Allergy, unspecified, initial encounter: Secondary | ICD-10-CM

## 2022-12-03 LAB — BMP8+ANION GAP
Anion Gap: 21 mmol/L — ABNORMAL HIGH (ref 10.0–18.0)
BUN/Creatinine Ratio: 16 (ref 12–28)
BUN: 18 mg/dL (ref 8–27)
CO2: 17 mmol/L — ABNORMAL LOW (ref 20–29)
Calcium: 9.4 mg/dL (ref 8.7–10.3)
Chloride: 100 mmol/L (ref 96–106)
Creatinine, Ser: 1.12 mg/dL — ABNORMAL HIGH (ref 0.57–1.00)
Glucose: 76 mg/dL (ref 70–99)
Potassium: 4.5 mmol/L (ref 3.5–5.2)
Sodium: 138 mmol/L (ref 134–144)
eGFR: 52 mL/min/{1.73_m2} — ABNORMAL LOW (ref >59)

## 2022-12-03 MED ORDER — PANTOPRAZOLE SODIUM 40 MG PO TBEC: 40.0000 mg | DELAYED_RELEASE_TABLET | Freq: Every day | ORAL | 0 refills | Status: DC

## 2022-12-03 MED ORDER — SIMVASTATIN 20 MG PO TABS: 20.0000 mg | ORAL_TABLET | Freq: Every day | ORAL | 2 refills | Status: DC

## 2022-12-03 MED ORDER — POTASSIUM CHLORIDE CRYS ER 20 MEQ PO TBCR: 40.0000 meq | EXTENDED_RELEASE_TABLET | Freq: Every day | ORAL | 0 refills | Status: DC

## 2022-12-03 MED ORDER — METOPROLOL SUCCINATE ER 25 MG PO TB24: 25.0000 mg | ORAL_TABLET | Freq: Every day | ORAL | 2 refills | Status: DC

## 2022-12-03 MED ORDER — DAPAGLIFLOZIN PROPANEDIOL 10 MG PO TABS: 10.0000 mg | ORAL_TABLET | Freq: Every day | ORAL | 0 refills | Status: DC

## 2022-12-03 MED ORDER — FLUTICASONE PROPIONATE 50 MCG/ACT NA SUSP
2.0000 | Freq: Every day | NASAL | 3 refills | Status: DC
Start: 2022-12-03 — End: 2023-05-17

## 2022-12-03 MED ORDER — NICOTINE 7 MG/24HR TD PT24: 7.0000 mg | MEDICATED_PATCH | Freq: Every day | TRANSDERMAL | 0 refills | Status: DC

## 2022-12-03 MED ORDER — HYDROCODONE-ACETAMINOPHEN 5-325 MG PO TABS: 1.0000 | ORAL_TABLET | ORAL | 0 refills | Status: DC | PRN

## 2022-12-03 NOTE — Telephone Encounter (Signed)
Need refill on Hydrocodone/APAP, #10 left. Broke hip in April, no appt on the books for f/u with Dr Berline Chough. Can you refill in Dr. Dahlia Client absence? [facility]

## 2022-12-04 ENCOUNTER — Telehealth: Payer: Self-pay | Admitting: Physical Medicine & Rehabilitation

## 2022-12-04 NOTE — Telephone Encounter (Signed)
Sent by  Dr. Benjie Karvonen

## 2022-12-04 NOTE — Telephone Encounter (Signed)
She needs her hydrocodone refilled

## 2022-12-05 NOTE — Progress Notes (Signed)
Internal Medicine Clinic Attending  Case discussed with the resident at the time of the visit.  We reviewed the resident's history and exam and pertinent patient test results.  I agree with the assessment, diagnosis, and plan of care documented in the resident's note.  

## 2022-12-07 ENCOUNTER — Telehealth: Payer: Self-pay | Admitting: Pharmacist

## 2022-12-07 ENCOUNTER — Telehealth: Payer: Self-pay | Admitting: *Deleted

## 2022-12-07 NOTE — Telephone Encounter (Signed)
Patient's daughter provides FS POC INR results after doses omitted this past Saturday & Sunday. INR has drifted down to 2.5 (target range 2.5 - 3.5) for MPV. Will recommence w 5mg  warfarin x 2 days; 2.5mg  on Wed, 5mg  on Th/Fri, 2.5mg  on Sa/Su.[id number].

## 2022-12-07 NOTE — Telephone Encounter (Signed)
Patient's INR reported as 5.8 on 11/23/2022. Information was forwarded to J. Alexandria Lodge.  Call from [place] from [facility].

## 2022-12-07 NOTE — Telephone Encounter (Signed)
Thank you :)

## 2022-12-07 NOTE — Telephone Encounter (Signed)
PST FS POC INR results on Friday 14-JUN-24 = 4.4 Was going to OMIT/HOLD that day's dose, but patient's daugther, [name], reports that patient had already taken that day's dose. Was then instructed to OMIT doses on Saturday, [id number] and Sunday, 16JUN24 and repeat FS POC INR test on Monday, December 07, 2022. Awaiting that result.

## 2022-12-08 ENCOUNTER — Other Ambulatory Visit (HOSPITAL_COMMUNITY): Payer: Self-pay | Admitting: Cardiology

## 2022-12-08 MED ORDER — TORSEMIDE 20 MG PO TABS: 40.0000 mg | ORAL_TABLET | Freq: Every day | ORAL | 0 refills | Status: DC

## 2022-12-08 NOTE — Telephone Encounter (Signed)
Agree, thank you

## 2022-12-08 NOTE — Telephone Encounter (Signed)
From: [name] To: Office of Anderson [place], [place] Sent: 12/08/2022 8:16 AM EDT Subject: Medication Renewal Request  Refills have been requested for the following medications:   torsemide (DEMADEX) 20 MG tablet Shanda Bumps M [place]]  Preferred pharmacy: [id number] - [place], [place] - [address] Delivery method: [employer]

## 2022-12-14 ENCOUNTER — Telehealth: Payer: Self-pay | Admitting: Pharmacist

## 2022-12-14 NOTE — Telephone Encounter (Signed)
Daughter provided PST FS POC INR result 2.7 (Target range 2.5 - 3.5) on 27.5mg  warfarin/wk. CONTINUE SAME regimen. Repeat PST INR on 1JUL24. No bleeding reported.

## 2022-12-16 ENCOUNTER — Encounter (HOSPITAL_COMMUNITY): Payer: Self-pay | Admitting: Internal Medicine

## 2022-12-16 ENCOUNTER — Other Ambulatory Visit: Payer: Self-pay

## 2022-12-16 MED ORDER — GABAPENTIN 300 MG PO CAPS: 300.0000 mg | ORAL_CAPSULE | Freq: Three times a day (TID) | ORAL | 0 refills | Status: DC

## 2022-12-17 ENCOUNTER — Ambulatory Visit (INDEPENDENT_AMBULATORY_CARE_PROVIDER_SITE_OTHER): Payer: 59 | Admitting: Orthopaedic Surgery

## 2022-12-17 ENCOUNTER — Encounter: Payer: Self-pay | Admitting: Orthopaedic Surgery

## 2022-12-17 ENCOUNTER — Other Ambulatory Visit (INDEPENDENT_AMBULATORY_CARE_PROVIDER_SITE_OTHER): Payer: 59

## 2022-12-17 DIAGNOSIS — S72141A Displaced intertrochanteric fracture of right femur, initial encounter for closed fracture: Secondary | ICD-10-CM

## 2022-12-17 NOTE — Progress Notes (Signed)
Internal Medicine Clinic Attending  Case and documentation reviewed.  I reviewed the AWV findings.  I agree with the assessment, diagnosis, and plan of care documented in the AWV note.     

## 2022-12-17 NOTE — Progress Notes (Signed)
Post-Op Visit Note   Patient: Kim Anthony           Date of Birth: 1950/12/10           [name]: [id number] Visit Date: 12/17/2022 PCP: Willette Cluster, MD   Assessment & Plan:  Chief Complaint:  Chief Complaint  Patient presents with   Right Hip - Follow-up    Right hip IM nailing 09/23/2022   Visit Diagnoses:  1. Closed intertrochanteric fracture of right femur, initial encounter [facility])     Plan: Kim Anthony is now 9-month status post right intertrochanteric hip fracture.  She is making steady progress.  She is doing physical therapy once a week at home.  Mainly using a walker and sometimes a cane for short distances at home.  Examination right hip shows fully healed surgical scars.  She is able to ambulate well with a walker without any apparent pain.  Range of motion of the hip is to be expected.  No signs of infection.  I am happy with the amount of fracture healing.  At this point I will release her to physical therapy at home.  Advance activity as tolerated.  Follow-up as needed.  Follow-Up Instructions: No follow-ups on file.   Orders:  Orders Placed This Encounter  Procedures   XR FEMUR, MIN 2 VIEWS RIGHT   No orders of the defined types were placed in this encounter.   Imaging: XR FEMUR, MIN 2 VIEWS RIGHT  Result Date: 12/17/2022 X-rays demonstrate continued fracture consolidation with abundant callus formation.  No hardware complications.   PMFS History: Patient Active Problem List   Diagnosis Date Noted   Bilateral hearing loss 12/01/2022   Hypokalemia 11/18/2022   Thrombocytosis 10/16/2022   Hyperkalemia 10/08/2022   AKI (acute kidney injury) (HCC) 10/07/2022   Alteration in anticoagulation 09/25/2022   History of mitral valve replacement with mechanical valve 09/24/2022   Anticoagulated 09/24/2022   Closed fracture of right hip (HCC) 09/23/2022   Closed intertrochanteric fracture of right femur, initial encounter (HCC) 09/22/2022   H/O mitral valve replacement  with mechanical valve 09/22/2022   Chronic HFrEF (heart failure with reduced ejection fraction) (HCC) 09/22/2022   Endometrial thickening on ultrasound 07/07/2022   Gastroesophageal reflux disease without esophagitis 06/29/2022   Chronic pain of both knees 06/29/2022   Vaginal bleeding 04/01/2022   A-fib (HCC) 03/13/2022   Facial edema 03/07/2022   Atypical atrial flutter (HCC)    Combined systolic and diastolic heart failure (HCC) 02/19/2022   CHF (congestive heart failure) (HCC) 02/19/2022   Combined systolic and diastolic congestive heart failure (HCC) 02/18/2022   Acute pulmonary edema (HCC)    Edema of both lower extremities 01/13/2022   Hypotension 01/13/2022   Subacute cough 11/28/2021   COPD exacerbation (HCC) 11/05/2021   Conjunctival hemorrhage of left eye 06/25/2021   Osteoarthritis of left knee 04/24/2021   Seronegative inflammatory arthritis 02/17/2021   High risk medication use 02/17/2021   Elevated transaminase level 01/27/2021   Acute idiopathic gout of multiple sites 09/30/2020   Chronic pain syndrome 01/17/2020   Monoarthritis of wrist 10/18/2019   Ganglion cyst 09/21/2019   Hip pain 08/23/2019   Thoracic ascending aortic aneurysm (HCC) 07/05/2019   Macrocytic anemia 02/16/2019   Breast cancer (HCC)    Acquired absence of left breast 12/20/2018   COPD (chronic obstructive pulmonary disease) (HCC) 11/25/2017   S/P MVR (mitral valve replacement)    Chronic anticoagulation    Tobacco use disorder 02/25/2017   Lymphadenopathy, mediastinal  02/25/2017   Essential hypertension 02/01/2016   Allergic rhinitis 02/01/2016   Healthcare maintenance 01/30/2016   Chronic low back pain 07/22/2015   Pulmonary embolism (HCC) 07/11/2015   Chronic combined systolic and diastolic heart failure (HCC) 07/11/2015   Headache, common migraine 07/11/2015   HLD (hyperlipidemia) 07/11/2015   Past Medical History:  Diagnosis Date   Arthritis    Asthma    Breast cancer (HCC) 2001    Left Breast Cancer   CHF (congestive heart failure) (HCC)    COPD (chronic obstructive pulmonary disease) (HCC)    Coronary artery disease    GERD (gastroesophageal reflux disease)    History of mitral valve replacement with mechanical valve    HLD (hyperlipidemia)    Hypertension    Pulmonary embolism (HCC) 2021    Family History  Problem Relation Age of Onset   Hypertension Mother    Cancer Father    Hypertension Father    Breast cancer Maternal Aunt 50   Heart attack Other     Past Surgical History:  Procedure Laterality Date   APPLICATION OF A-CELL OF CHEST/ABDOMEN Left 01/27/2019   Procedure: APPLICATION OF A-CELL OF CHEST;  Surgeon: Peggye Form, DO;  Location: [facility] OR;  Service: Plastics;  Laterality: Left;   APPLICATION OF WOUND VAC N/A 01/27/2019   Procedure: APPLICATION OF WOUND VAC;  Surgeon: Peggye Form, DO;  Location: [facility] OR;  Service: Plastics;  Laterality: N/A;   BREAST SURGERY Left 2001   for CA   CARDIOVERSION N/A 02/26/2022   Procedure: CARDIOVERSION;  Surgeon: Little Ishikawa, MD;  Location: [facility] ENDOSCOPY;  Service: Cardiovascular;  Laterality: N/A;   COLONOSCOPY WITH PROPOFOL N/A 06/07/2018   Procedure: COLONOSCOPY WITH PROPOFOL;  Surgeon: Charlott Rakes, MD;  Location: [facility] ENDOSCOPY;  Service: Endoscopy;  Laterality: N/A;   DEBRIDEMENT AND CLOSURE WOUND Left 12/28/2018   Procedure: Debridement And Closure Wound;  Surgeon: Abigail Miyamoto, MD;  Location: [facility] OR;  Service: General;  Laterality: Left;   INCISION AND DRAINAGE OF WOUND Left 01/27/2019   Procedure: IRRIGATION AND DEBRIDEMENT OF CHEST WALL;  Surgeon: Peggye Form, DO;  Location: [facility] OR;  Service: Plastics;  Laterality: Left;   INTRAMEDULLARY (IM) NAIL INTERTROCHANTERIC N/A 09/23/2022   Procedure: INTRAMEDULLARY (IM) NAIL INTERTROCHANTERIC;  Surgeon: Tarry Kos, MD;  Location: [facility] OR;  Service: Orthopedics;  Laterality: N/A;   KNEE ARTHROSCOPY WITH MEDIAL MENISECTOMY  Left 08/06/2021   Procedure: KNEE ARTHROSCOPY WITH PARTIAL MEDIAL MENISECTOMY, DEBRIDEMENT;  Surgeon: Jene Every, MD;  Location: [facility] ORS;  Service: Orthopedics;  Laterality: Left;  1 HR   LATISSIMUS FLAP TO BREAST Left 01/18/2019   Procedure: LEFT LATISSIMUS FLAP TO BREAST;  Surgeon: Peggye Form, DO;  Location: [facility] OR;  Service: Plastics;  Laterality: Left;   MASTECTOMY Left    MITRAL VALVE REPLACEMENT     mechanical   POLYPECTOMY  06/07/2018   Procedure: POLYPECTOMY;  Surgeon: Charlott Rakes, MD;  Location: [facility] ENDOSCOPY;  Service: Endoscopy;;   RIGHT/LEFT HEART CATH AND CORONARY ANGIOGRAPHY N/A 01/09/2021   Procedure: RIGHT/LEFT HEART CATH AND CORONARY ANGIOGRAPHY;  Surgeon: Dolores Patty, MD;  Location: [facility] INVASIVE CV LAB;  Service: Cardiovascular;  Laterality: N/A;   WOUND DEBRIDEMENT Left 12/28/2018   Procedure: EXCISION OF LEFT CHRONIC CHEST WALL WOUND;  Surgeon: Abigail Miyamoto, MD;  Location: [facility] OR;  Service: General;  Laterality: Left;   Social History   Occupational History   Not on file  Tobacco Use   Smoking status:  Every Day    Packs/day: 0.10    Years: 30.00    Additional pack years: 0.00    Total pack years: 3.00    Types: Cigarettes   Smokeless tobacco: Never   Tobacco comments:    5 cigs per day  Vaping Use   Vaping Use: Never used  Substance and Sexual Activity   Alcohol use: Yes    Alcohol/week: 3.0 standard drinks of alcohol    Types: 3 Standard drinks or equivalent per week    Comment: beer- ocasional   Drug use: No   Sexual activity: Not Currently    Birth control/protection: None

## 2022-12-18 ENCOUNTER — Other Ambulatory Visit: Payer: Self-pay | Admitting: Internal Medicine

## 2022-12-18 DIAGNOSIS — J441 Chronic obstructive pulmonary disease with (acute) exacerbation: Secondary | ICD-10-CM

## 2022-12-21 ENCOUNTER — Telehealth: Payer: Self-pay | Admitting: Orthopaedic Surgery

## 2022-12-21 NOTE — Telephone Encounter (Signed)
Kim Anthony (PT) from [facility] called requesting verbal orders for continued pt 1wk 8. Please call Jae Dire on secure line or leave a detailed message at [phone].

## 2022-12-21 NOTE — Telephone Encounter (Signed)
Called and left verbal via voicemail.

## 2022-12-23 ENCOUNTER — Encounter (HOSPITAL_COMMUNITY): Payer: 59

## 2022-12-29 ENCOUNTER — Telehealth: Payer: Self-pay

## 2022-12-29 ENCOUNTER — Telehealth: Payer: Self-pay | Admitting: *Deleted

## 2022-12-29 MED ORDER — HYDROCODONE-ACETAMINOPHEN 5-325 MG PO TABS: 1.0000 | ORAL_TABLET | ORAL | 0 refills | Status: DC | PRN

## 2022-12-29 NOTE — Telephone Encounter (Signed)
Received fax from [facility]. INR 2.0 test date of 12/28/22 @ 1134AM. Dr Alexandria Lodge unavailable until Thursday.

## 2022-12-29 NOTE — Telephone Encounter (Signed)
Filled  Written  ID  Drug  QTY  Days  Prescriber  RX #  Dispenser  Refill  Daily Dose*  Pymt Type  PMP  12/18/2022 12/16/2022 2  Gabapentin 300 Mg Capsule 90.00 30 Ty Rog [id number] [facility] [phone] 0/0  Medicare [place] 12/06/2022 12/03/2022 2  Hydrocodone-Acetamin 5-325 Mg 150.00 25 Corie Chiquito [id number] [facility] [phone] 0/0 30.00 MME Medicare [place] 11/22/2022 10/08/2022 2  Gabapentin 300 Mg Capsule 90.00 30 Ta Map [id number] [facility] ([phone]) 0/0  Medicare [place] 11/11/2022 11/11/2022 2  Hydrocodone-Acetamin 10-325 Mg 150.00 25 Me Lov [id number] [facility] ([id number]) 0/0 60.00 MME Medicare [place] 10/24/2022 10/24/2022 2  Gabapentin 300 Mg Capsule 90.00 30 Kh Bla [phone] [facility] ([id number]) 0/0  Medicare [place]  Hydrocodone refill requested. Please send to [facility] on [address]

## 2022-12-30 ENCOUNTER — Telehealth (HOSPITAL_COMMUNITY): Payer: Self-pay

## 2022-12-30 NOTE — Telephone Encounter (Signed)
Called to confirm/remind patient of their appointment at [facility] on 12/31/22.   Patient reminded to bring all medications and/or complete list.  Confirmed patient has transportation. Gave directions, instructed to utilize valet parking.  Confirmed appointment prior to ending call.   

## 2022-12-31 ENCOUNTER — Encounter (HOSPITAL_COMMUNITY): Payer: Self-pay

## 2022-12-31 ENCOUNTER — Ambulatory Visit: Payer: 59 | Admitting: Audiologist

## 2022-12-31 ENCOUNTER — Ambulatory Visit (HOSPITAL_COMMUNITY)
Admission: RE | Admit: 2022-12-31 | Discharge: 2022-12-31 | Disposition: A | Discharge status: Home / Self Care | Payer: 59 | Source: Ambulatory Visit | Attending: Cardiology | Admitting: Cardiology

## 2022-12-31 VITALS — BP 114/70 | HR 74 | Wt 167.2 lb

## 2022-12-31 DIAGNOSIS — Z79899 Other long term (current) drug therapy: Secondary | ICD-10-CM | POA: Diagnosis not present

## 2022-12-31 DIAGNOSIS — I272 Pulmonary hypertension, unspecified: Secondary | ICD-10-CM | POA: Diagnosis not present

## 2022-12-31 DIAGNOSIS — Z853 Personal history of malignant neoplasm of breast: Secondary | ICD-10-CM | POA: Diagnosis not present

## 2022-12-31 DIAGNOSIS — J449 Chronic obstructive pulmonary disease, unspecified: Secondary | ICD-10-CM | POA: Diagnosis not present

## 2022-12-31 DIAGNOSIS — Z952 Presence of prosthetic heart valve: Secondary | ICD-10-CM | POA: Diagnosis not present

## 2022-12-31 DIAGNOSIS — Z9012 Acquired absence of left breast and nipple: Secondary | ICD-10-CM | POA: Diagnosis not present

## 2022-12-31 DIAGNOSIS — I2510 Atherosclerotic heart disease of native coronary artery without angina pectoris: Secondary | ICD-10-CM | POA: Diagnosis not present

## 2022-12-31 DIAGNOSIS — F17210 Nicotine dependence, cigarettes, uncomplicated: Secondary | ICD-10-CM | POA: Insufficient documentation

## 2022-12-31 DIAGNOSIS — I5042 Chronic combined systolic (congestive) and diastolic (congestive) heart failure: Secondary | ICD-10-CM | POA: Diagnosis not present

## 2022-12-31 DIAGNOSIS — I5023 Acute on chronic systolic (congestive) heart failure: Secondary | ICD-10-CM | POA: Insufficient documentation

## 2022-12-31 DIAGNOSIS — Z8249 Family history of ischemic heart disease and other diseases of the circulatory system: Secondary | ICD-10-CM | POA: Insufficient documentation

## 2022-12-31 DIAGNOSIS — Z86711 Personal history of pulmonary embolism: Secondary | ICD-10-CM | POA: Diagnosis not present

## 2022-12-31 DIAGNOSIS — Z86718 Personal history of other venous thrombosis and embolism: Secondary | ICD-10-CM | POA: Diagnosis not present

## 2022-12-31 DIAGNOSIS — Z7901 Long term (current) use of anticoagulants: Secondary | ICD-10-CM | POA: Diagnosis not present

## 2022-12-31 DIAGNOSIS — I110 Hypertensive heart disease with heart failure: Secondary | ICD-10-CM | POA: Diagnosis not present

## 2022-12-31 LAB — BRAIN NATRIURETIC PEPTIDE: B Natriuretic Peptide: 854.5 pg/mL — ABNORMAL HIGH (ref 0.0–100.0)

## 2022-12-31 LAB — BASIC METABOLIC PANEL
Anion gap: 13 (ref 5–15)
BUN: 34 mg/dL — ABNORMAL HIGH (ref 8–23)
CO2: 20 mmol/L — ABNORMAL LOW (ref 22–32)
Calcium: 8.7 mg/dL — ABNORMAL LOW (ref 8.9–10.3)
Chloride: 101 mmol/L (ref 98–111)
Creatinine, Ser: 2.15 mg/dL — ABNORMAL HIGH (ref 0.44–1.00)
GFR, Estimated: 24 mL/min — ABNORMAL LOW (ref >60)
Glucose, Bld: 79 mg/dL (ref 70–99)
Potassium: 4.4 mmol/L (ref 3.5–5.1)
Sodium: 134 mmol/L — ABNORMAL LOW (ref 135–145)

## 2022-12-31 MED ORDER — TORSEMIDE 20 MG PO TABS: 40.0000 mg | ORAL_TABLET | Freq: Two times a day (BID) | ORAL | 5 refills | Status: DC

## 2022-12-31 MED ORDER — POTASSIUM CHLORIDE CRYS ER 10 MEQ PO TBCR: 60.0000 meq | EXTENDED_RELEASE_TABLET | Freq: Every day | ORAL | 5 refills | Status: DC

## 2022-12-31 NOTE — Progress Notes (Signed)
ReDS Vest / Clip - 12/31/22 1600       ReDS Vest / Clip   Station Marker A    Ruler Value 32    ReDS Value Range Low volume    ReDS Actual Value 33

## 2022-12-31 NOTE — Progress Notes (Signed)
Advanced Heart Failure Clinic Note   PCP: Kathleen Lime, MD HF Cardiologist: Dr. Gala Romney  HPI: Kim Anthony is a 72 y.o. female with a history of mechanical MVR in 2001 in [place], [place], CAD, hx breast cancer s/p left mastectomy and radiation/chemo, tobacco use, COPD, prior PE/DVT, HTN, undergoing workup for possible inflammatory arthritis with Rheumatology, and systolic CHF.   EF (2017) 45-50% (no EF on file prior to this). Had improved to 55-60% on Echo in July 2020.  She was admitted 01/05/21 with A/C CHF exacerbation and likely COPD exacerbation.Echo with EF of 35-40%, previously 55-60% on echo July 2020. No significant MR. Mildly enlarged RV. Etiology for decline in EF was not certain so she was scheduled for [facility] to rule out obstructive CAD. R/LHC showed 1v CAD with occluded mRCA and stable hemodynamics.  Echo 08/01/21 EF 50-55% RV moderately down. MVR ok Mean gradient 2.7  Admitted 8/23 w/ a/c CHF and AFL. Echo showed EF 35-40%, LV global HK, severe RV dysfunction, mean mitral valve gradient 8 mmHg, severe TR. Diuresed with IV lasix and started on oral amiodarone load. Underwent TEE showing LVEF 35-40%, global HK, hypokinetic RV poorly visualized, no thrombus in LAA. Unfortunately patient had increased secretions and laryngospams and test aborted. Later underwent DCCV from AFL to NSR. She was discharged home on amiodarone.  Admitted 9/23 with facial swelling. Resolved and referred to allergy. QTc long on ECG (QTc 410 msec on ECG 03/09/22), and amiodarone then stopped.  Follow up 9/23, NYHA II and volume up. Torsemide 20 mg daily restarted. Amiodarone restarted.  Echo 11/23 EF 35-40% Severe RV dysfunction MVR stable Mean gradient 3. Severe TR  RVSP 48  4/24 had mechanical fall and fractured rt femur, treated w/ IM nailing.   She presents today for f/u. Here w/ her daughter. Endorses increase in LEE. Also w/ wt gain. She is up 13 lb since her last clinic visit here in December and up 7  lb over the last month. Reports full compliance w/ home diuretic regiment and GDMT but admits to dietary indiscretion w/ sodium. Recently went on vacation to [facility] and ate out at restaurants. C/w chronic NYHA Class II symptoms. Denies orthopnea/PND.  ReDs 33%. EKG today shows NSR 74 bpm. BP controlled 114/70. Has home sleep study kit but has not completed yet.     Cardiac Studies:  - Echo 11/23 EF 35-40% Severe RV dysfunction MVR stable Mean gradient 3. Severe TR  RVSP 48  - Echo (8/23): EF 35-40%, LV global HK, severe RV dysfunction, mean mitral valve gradient 8 mmHg, severe TR.   - Echo (2/23): EF 50-55%, RV moderately down, MVR ok  - Echo (7/22): EF 35-40% (Dr. Jacqulyn Cane felt EF 45%)  - R/LHC (7/22): with 1v CAD with occluded mRCA, RA 9, PCW 14, CI 2.9.  - Echo (2020): EF 55-60%    Past Medical History:  Diagnosis Date   Arthritis    Asthma    Breast cancer (HCC) 2001   Left Breast Cancer   CHF (congestive heart failure) (HCC)    COPD (chronic obstructive pulmonary disease) (HCC)    Coronary artery disease    GERD (gastroesophageal reflux disease)    History of mitral valve replacement with mechanical valve    HLD (hyperlipidemia)    Hypertension    Pulmonary embolism (HCC) 2021   Current Outpatient Medications  Medication Sig Dispense Refill   albuterol (VENTOLIN HFA) 108 (90 Base) MCG/ACT inhaler Inhale 2 puffs into the lungs every  6 (six) hours as needed for wheezing or shortness of breath. 8 g 5   dapagliflozin propanediol (FARXIGA) 10 MG TABS tablet Take 1 tablet (10 mg total) by mouth daily. 90 tablet 0   DULoxetine (CYMBALTA) 20 MG capsule Take 20 mg by mouth at bedtime. As needed     fluticasone (FLONASE) 50 MCG/ACT nasal spray Place 2 sprays into both nostrils daily. 11.1 mL 3   Fluticasone-Umeclidin-Vilant (TRELEGY ELLIPTA) 100-62.5-25 MCG/ACT AEPB INHALE 1 PUFF BY MOUTH INTO THE LUNGS ONCE DAILY 60 each 1   gabapentin (NEURONTIN) 300 MG capsule Take 1  capsule (300 mg total) by mouth 3 (three) times daily. 90 capsule 0   HYDROcodone-acetaminophen (NORCO/VICODIN) 5-325 MG tablet Take 1 tablet by mouth every 4 (four) hours as needed for moderate pain. 150 tablet 0   losartan (COZAAR) 25 MG tablet Take 0.5 tablets (12.5 mg total) by mouth daily. 45 tablet 2   metoprolol succinate (TOPROL-XL) 25 MG 24 hr tablet Take 1 tablet (25 mg total) by mouth daily. 90 tablet 2   pantoprazole (PROTONIX) 40 MG tablet Take 1 tablet (40 mg total) by mouth daily. 90 tablet 0   potassium chloride SA (KLOR-CON M) 20 MEQ tablet Take 2 tablets (40 mEq total) by mouth daily. 60 tablet 0   simvastatin (ZOCOR) 20 MG tablet Take 1 tablet (20 mg total) by mouth daily. 90 tablet 2   torsemide (DEMADEX) 20 MG tablet Take 2 tablets (40 mg total) by mouth daily. NEEDS FOLLOW UP FOR MORE REFILLS 60 tablet 0   warfarin (COUMADIN) 5 MG tablet Take one-half (1/2) of your 5mg  peach-colored warfarin tablet on Mondays, Wednesdays and Fridays. All OTHER days (Sundays, Tuesdays, Thursdays and Saturdays)--take one (1) tablet. (Patient taking differently: Patient takes 5 mg daily and take 1/2 tablet on Sundays.) 20 tablet 2   nicotine (NICODERM CQ - DOSED IN MG/24 HR) 7 mg/24hr patch Place 1 patch (7 mg total) onto the skin daily. (Patient not taking: Reported on 12/31/2022) 28 patch 0   No current facility-administered medications for this encounter.   Allergies  Allergen Reactions   Lisinopril Swelling   Acetaminophen-Codeine Itching   Propoxyphene Itching    Darvocet   Tape Rash    Plastic   Tramadol Itching, Nausea And Vomiting and Rash   Social History   Socioeconomic History   Marital status: Single    Spouse name: Not on file   Number of children: Not on file   Years of education: Not on file   Highest education level: Not on file  Occupational History   Not on file  Tobacco Use   Smoking status: Every Day    Current packs/day: 0.10    Average packs/day: 0.1  packs/day for 30.0 years (3.0 ttl pk-yrs)    Types: Cigarettes   Smokeless tobacco: Never   Tobacco comments:    5 cigs per day  Vaping Use   Vaping status: Never Used  Substance and Sexual Activity   Alcohol use: Yes    Alcohol/week: 3.0 standard drinks of alcohol    Types: 3 Standard drinks or equivalent per week    Comment: beer- ocasional   Drug use: No   Sexual activity: Not Currently    Birth control/protection: None  Other Topics Concern   Not on file  Social History Narrative   ** Merged History Encounter **       Social Determinants of Health   Financial Resource Strain: Low Risk  (12/01/2022)  Overall Financial Resource Strain (CARDIA)    Difficulty of Paying Living Expenses: Not very hard  Food Insecurity: No Food Insecurity (12/01/2022)   Hunger Vital Sign    Worried About Running Out of Food in the Last Year: Never true    Ran Out of Food in the Last Year: Never true  Transportation Needs: No Transportation Needs (12/01/2022)   PRAPARE - Administrator, Civil Service (Medical): No    Lack of Transportation (Non-Medical): No  Physical Activity: Inactive (12/01/2022)   Exercise Vital Sign    Days of Exercise per Week: 0 days    Minutes of Exercise per Session: 10 min  Stress: Stress Concern Present (12/01/2022)   [employer] of Occupational Health - Occupational Stress Questionnaire    Feeling of Stress : To some extent  Social Connections: Socially Isolated (12/01/2022)   Social Connection and Isolation Panel [NHANES]    Frequency of Communication with Friends and Family: More than three times a week    Frequency of Social Gatherings with Friends and Family: More than three times a week    Attends Religious Services: Never    Database administrator or Organizations: No    Attends Banker Meetings: Never    Marital Status: Never married  Intimate Partner Violence: Not At Risk (12/01/2022)   Humiliation, Afraid, Rape, and Kick  questionnaire    Fear of Current or Ex-Partner: No    Emotionally Abused: No    Physically Abused: No    Sexually Abused: No    Family History  Problem Relation Age of Onset   Hypertension Mother    Cancer Father    Hypertension Father    Breast cancer Maternal Aunt 50   Heart attack Other    BP 114/70   Pulse 74   Wt 75.8 kg (167 lb 3.2 oz)   SpO2 95%   BMI 27.82 kg/m   Wt Readings from Last 3 Encounters:  12/31/22 75.8 kg (167 lb 3.2 oz)  12/01/22 72.8 kg (160 lb 9.6 oz)  12/01/22 72.8 kg (160 lb 9.6 oz)   PHYSICAL EXAM: ReDs 33%  General:  Well appearing. No respiratory difficulty HEENT: normal Neck: supple. JVD elevated to jaw. Carotids 2+ bilat; no bruits. No lymphadenopathy or thyromegaly appreciated. Cor: PMI nondisplaced. Regular rate & rhythm. No rubs, gallops or murmurs. Lungs: clear Abdomen: soft, nontender, ++distended. No hepatosplenomegaly. No bruits or masses. Good bowel sounds. Extremities: no cyanosis, clubbing, rash, trace b/l LEE edema Neuro: alert & oriented x 3, cranial nerves grossly intact. moves all 4 extremities w/o difficulty. Affect pleasant.    ASSESSMENT & PLAN: Acute on Chronic Systolic Heart Failure w/ Biventricular Dysfunction/ Pulmonary HTN  - Echo (2017): EF 45-50% in setting of PE. Has mild to moderate RV dysfunction. MV prosthesis okay. - Echo (7/22): EF 35-40% this admit (Dr. Jacqulyn Cane felt EF 45%), previously 55-60% in 2020.  - R/LHC (01/09/21): with 1v CAD mRCA chronically occluded and well compensated hemodynamics with mild pulmonary HTN and normal CO - Echo (08/01/21): EF 50-55% RV moderately down. MVR ok Mean gradient 3  - Admitted 8/23 with AFL with RVR and a/c CHF. - Echo (8/23): EF 35-40%, LV global HK, severe RV dysfunction, mean mitral valve gradient 8 mmHg, severe TR. ? Tachy mediated reduction in EF. - Echo 11/23 EF 35-40% Severe RV dysfunction MVR stable Mean gradient 3. Severe TR  RVSP 48 - NYHA II. Wt is up 13 lb. Exam  c/w volume  overload w/ elevated JVD, abdominal distention and mild LEE. R >L HF symptoms. ReDs only 33%. Admits to dietary indiscretion w/ sodium.  - Increase Torsemide to 40 mg bid x 3 days, then back to 40 mg daily. Check BMP and BNP today  - Continue Losartan 12.5 mg daily (h/o angioedema with ACE-I, so no ARNi). - Continue spiro 25 mg daily. - Continue Toprol XL 25 mg daily. - Continue Farxiga 10 mg daily. - Suspect likely underlying OSA contributing to PH/RV failure. Also w/ know COPD. Encouraged to complete home sleep study for further evaluation. Encouraged smoking cessation.    2. AFL, paroxysmal  - s/p TEE and DCCV 9/23 to NSR. - She was instructed to stop amio due to long QTc (QTC 410 msec on recent ECG). Minimize QT pro-longing agents - She does not do well in AFL/AF. - NSR on EKG today  - Continue amiodarone 200 mg daily. Not ideal with her COPD. If she has recurrence, would recommend referral to EP for other AAD vs ablation options.  - Continue warfarin. INR followed by [name]. - She has home sleep study, has not completed. Encouraged to do so   3. CAD  - 1 V CAD with occluded mid RCA on cath (7/22). - No s/s angina - No ASA with warfarin use. - Continue beta blocker + statin.     4. Hx of mechanical mitral valve replacement: - Mechanical MVR in [place] in 2001. - Echo (7/22) mean gradient of 4 mmHg with no significant MR. - Echo 08/01/21 EF 50-55% RV moderately down. MVR ok Mean gradient 3  - Echo 8/23 mitral valve mean gradient 8 mmHg (was in AFL and volume overload) - TEE (9/23) poor images due to laryngospasm/airway issues. - Echo 11/23 EF 35-40% Severe RV dysfunction MVR stable Mean gradient 3. Severe TR  RVSP 48 - Continue Coumadin. Managed by Chancy Milroy PharmD - Continue SBE prophylaxis.   5. Tricuspid valve regurgitation - Mild to moderate by echo 3/23. - Severe on recent echo but tolerating well   6. Hx DVT/PE - On Coumadin. - INRs followed by Dr.  Alexandria Lodge.   7. HTN - controlled on current regimen. No changes today - check BMP   8. COPD with ongoing tobacco use - Continue inhalers. - Stressed need for smoking cessation  F/u w/ APP to reassess volume status in 7-10 days   Robbie Lis, PA-C  3:50 PM

## 2022-12-31 NOTE — Telephone Encounter (Signed)
Kim Anthony/daugter was provided increased dosing on Monday 8JUL24 while I was on vacation at 9:53PM. 5mg  M-F; 2.5mg  Sa/Sun. Repeat PST 15JUL24.

## 2022-12-31 NOTE — Patient Instructions (Signed)
EKG done today.   Labs done today. We will contact you only if your labs are abnormal.  INCREASE Torsemide to 40mg  (2 tablets) by mouth 2 times daily.   INCREASE Potassium to ( 6 tablets) by mouth daily.   No other medication changes were made. Please continue all current medications as prescribed.  Your physician recommends that you schedule a follow-up appointment in: 7-10 days with our NP/PA Clinic here in our office.   If you have any questions or concerns before your next appointment please send [place] a message through [place] or call our office at [phone].    TO LEAVE A MESSAGE FOR THE NURSE SELECT OPTION 2, PLEASE LEAVE A MESSAGE INCLUDING: YOUR NAME DATE OF BIRTH CALL BACK NUMBER REASON FOR CALL**this is important as we prioritize the call backs  YOU WILL RECEIVE A CALL BACK THE SAME DAY AS LONG AS YOU CALL BEFORE 4:00 PM   Do the following things EVERYDAY: Weigh yourself in the morning before breakfast. Write it down and keep it in a log. Take your medicines as prescribed Eat low salt foods--Limit salt (sodium) to 2000 mg per day.  Stay as active as you can everyday Limit all fluids for the day to less than 2 liters   At the [facility], you and your health needs are our priority. As part of our continuing mission to provide you with exceptional heart care, we have created designated Provider Care Teams. These Care Teams include your primary Cardiologist (physician) and Advanced Practice Providers (APPs- Physician Assistants and Nurse Practitioners) who all work together to provide you with the care you need, when you need it.   You may see any of the following providers on your designated Care Team at your next follow up: Dr Arvilla Meres Dr Marca Ancona Dr. Marcos Eke, NP Robbie Lis, [place] [facility] [place], [place] Brynda Peon, NP Karle Plumber, PharmD   Please be sure to bring in all your medications  bottles to every appointment.    Thank you for choosing [facility]

## 2023-01-01 ENCOUNTER — Encounter (HOSPITAL_COMMUNITY): Payer: Self-pay

## 2023-01-01 ENCOUNTER — Telehealth (HOSPITAL_COMMUNITY): Payer: Self-pay | Admitting: Cardiology

## 2023-01-01 DIAGNOSIS — I5042 Chronic combined systolic (congestive) and diastolic (congestive) heart failure: Secondary | ICD-10-CM

## 2023-01-01 NOTE — Telephone Encounter (Signed)
-----   Message from Scott sent at 01/01/2023  8:53 AM EDT ----- BNP fluid marker elevated c/w volume overload. Labs also shows AKI. Plan to increase torsemide to 40 mg bid x 3 days and return for repeat Labs on Monday 7/15. Needs f/u BMP and BNP.

## 2023-01-01 NOTE — Telephone Encounter (Signed)
Patient called.  Patient aware.  

## 2023-01-04 ENCOUNTER — Other Ambulatory Visit: Payer: Self-pay

## 2023-01-04 ENCOUNTER — Other Ambulatory Visit (HOSPITAL_COMMUNITY): Payer: 59

## 2023-01-04 NOTE — Telephone Encounter (Signed)
Patient is requesting Gabapentin 800mg  she stated the 300mg  is not working for her.

## 2023-01-04 NOTE — Telephone Encounter (Signed)
Kim Anthony kidney clearance is now 60mL/min  per her last labs. The recommended dose for her clearance rate is 600mg  daily, meaning her current does is even a bit much for her kidneys to clear. Increasing to 800mg  TID will pose more risk than benefit to her at this time.  I am therefore refusing the prescription request per my medical judgement.   I am however available if patient has any concerns or questions. Thank you

## 2023-01-05 ENCOUNTER — Telehealth: Payer: Self-pay | Admitting: *Deleted

## 2023-01-05 ENCOUNTER — Ambulatory Visit (HOSPITAL_COMMUNITY)
Admission: RE | Admit: 2023-01-05 | Discharge: 2023-01-05 | Disposition: A | Discharge status: Home / Self Care | Payer: 59 | Source: Ambulatory Visit | Attending: Cardiology | Admitting: Cardiology

## 2023-01-05 DIAGNOSIS — I5042 Chronic combined systolic (congestive) and diastolic (congestive) heart failure: Secondary | ICD-10-CM | POA: Diagnosis present

## 2023-01-05 LAB — BASIC METABOLIC PANEL
Anion gap: 12 (ref 5–15)
BUN: 20 mg/dL (ref 8–23)
CO2: 26 mmol/L (ref 22–32)
Calcium: 9.6 mg/dL (ref 8.9–10.3)
Chloride: 98 mmol/L (ref 98–111)
Creatinine, Ser: 1.23 mg/dL — ABNORMAL HIGH (ref 0.44–1.00)
GFR, Estimated: 46 mL/min — ABNORMAL LOW (ref >60)
Glucose, Bld: 91 mg/dL (ref 70–99)
Potassium: 3.5 mmol/L (ref 3.5–5.1)
Sodium: 136 mmol/L (ref 135–145)

## 2023-01-05 LAB — BRAIN NATRIURETIC PEPTIDE: B Natriuretic Peptide: 415.5 pg/mL — ABNORMAL HIGH (ref 0.0–100.0)

## 2023-01-05 NOTE — Telephone Encounter (Signed)
Call from patient asking why her Gabapentin was not filled.  Explained to patient that for now her Kidneys are not able to tolerate the increase. The effects on her Kidney outweigh the benefits of taking the medication.  Patient voiced understanding of.  C/O itching all over.  Patient given an appointment to come in on tomorrow at 10:15 AM to discuss the itching.  Stated is worsening since she had Shingles.

## 2023-01-05 NOTE — Telephone Encounter (Signed)
Contacted last night [id number]  by daugher providing PST FS POC INR 3.4 (Target range 2.5 - 3.5) on 5mg  warfarin for six days of week, 2.5mg  on 1 day. Changed dosing to 5mg  warfarin for 5 days; 2.5mg  on 2 days. Repeat INR 22JUL24.

## 2023-01-06 ENCOUNTER — Encounter: Payer: 59 | Admitting: Student

## 2023-01-06 NOTE — Telephone Encounter (Signed)
Please reschedule pt's appt. Thanks!

## 2023-01-18 ENCOUNTER — Telehealth (HOSPITAL_COMMUNITY): Payer: Self-pay

## 2023-01-18 NOTE — Progress Notes (Signed)
Advanced Heart Failure Clinic Note   PCP: Kathleen Lime, MD HF Cardiologist: Dr. Gala Romney  HPI: Kim Anthony is a 72 y.o. female with a history of mechanical MVR in 2001 in [place], [place], CAD, hx breast cancer s/p left mastectomy and radiation/chemo, tobacco use, COPD, prior PE/DVT, HTN, undergoing workup for possible inflammatory arthritis with Rheumatology, and systolic CHF.   EF (2017) 45-50% (no EF on file prior to this). Had improved to 55-60% on Echo in July 2020.  She was admitted 01/05/21 with A/C CHF exacerbation and likely COPD exacerbation.Echo with EF of 35-40%, previously 55-60% on echo July 2020. No significant MR. Mildly enlarged RV. Etiology for decline in EF was not certain so she was scheduled for [facility] to rule out obstructive CAD. R/LHC showed 1v CAD with occluded mRCA and stable hemodynamics.  Echo 08/01/21 EF 50-55% RV moderately down. MVR ok Mean gradient 2.7  Admitted 8/23 w/ a/c CHF and AFL. Echo showed EF 35-40%, LV global HK, severe RV dysfunction, mean mitral valve gradient 8 mmHg, severe TR. Diuresed with IV lasix and started on oral amiodarone load. Underwent TEE showing LVEF 35-40%, global HK, hypokinetic RV poorly visualized, no thrombus in LAA. Unfortunately patient had increased secretions and laryngospams and test aborted. Later underwent DCCV from AFL to NSR. She was discharged home on amiodarone.  Admitted 9/23 with facial swelling. Resolved and referred to allergy. QTc long on ECG (QTc 410 msec on ECG 03/09/22), and amiodarone then stopped.  Follow up 9/23, NYHA II and volume up. Torsemide 20 mg daily restarted. Amiodarone restarted.  Echo 11/23 EF 35-40%, severe RV dysfunction MVR stable Mean gradient 3. Severe TR  RVSP 48  4/24 had mechanical fall and fractured rt femur, treated w/ IM nailing.   Follow up 7/24, she was volume overloaded and weight up 13 lbs. Torsemide increased to 40 bid x 3 days, then back to 40 daily.   Today she returns for close HF  follow up with her daughter and grand-daughter. Overall feeling much better. Breathing has improved, she can get around the house and works with PT without significant dyspnea. Denies palpitations, CP, dizziness, edema, or PND/Orthopnea. Appetite fair. No fever or chills. She has not weighed at home. Taking all medications. Has home sleep study kit but has not completed yet. Smoking 3 cigs/day.  Cardiac Studies:  - Echo 11/23 EF 35-40% Severe RV dysfunction MVR stable Mean gradient 3. Severe TR  RVSP 48  - Echo (8/23): EF 35-40%, LV global HK, severe RV dysfunction, mean mitral valve gradient 8 mmHg, severe TR.   - Echo (2/23): EF 50-55%, RV moderately down, MVR ok  - Echo (7/22): EF 35-40% (Dr. Jacqulyn Cane felt EF 45%)  - R/LHC (7/22): with 1v CAD with occluded mRCA, RA 9, PCW 14, CI 2.9.  - Echo (2020): EF 55-60%    Past Medical History:  Diagnosis Date   Arthritis    Asthma    Breast cancer (HCC) 2001   Left Breast Cancer   CHF (congestive heart failure) (HCC)    COPD (chronic obstructive pulmonary disease) (HCC)    Coronary artery disease    GERD (gastroesophageal reflux disease)    History of mitral valve replacement with mechanical valve    HLD (hyperlipidemia)    Hypertension    Pulmonary embolism (HCC) 2021   Current Outpatient Medications  Medication Sig Dispense Refill   albuterol (VENTOLIN HFA) 108 (90 Base) MCG/ACT inhaler Inhale 2 puffs into the lungs every 6 (six) hours as  needed for wheezing or shortness of breath. 8 g 5   dapagliflozin propanediol (FARXIGA) 10 MG TABS tablet Take 1 tablet (10 mg total) by mouth daily. 90 tablet 0   DULoxetine (CYMBALTA) 20 MG capsule Take 20 mg by mouth at bedtime. As needed     fluticasone (FLONASE) 50 MCG/ACT nasal spray Place 2 sprays into both nostrils daily. 11.1 mL 3   Fluticasone-Umeclidin-Vilant (TRELEGY ELLIPTA) 100-62.5-25 MCG/ACT AEPB INHALE 1 PUFF BY MOUTH INTO THE LUNGS ONCE DAILY 60 each 1   gabapentin (NEURONTIN)  300 MG capsule Take 1 capsule (300 mg total) by mouth 3 (three) times daily. 90 capsule 0   HYDROcodone-acetaminophen (NORCO/VICODIN) 5-325 MG tablet Take 1 tablet by mouth every 4 (four) hours as needed for moderate pain. 150 tablet 0   losartan (COZAAR) 25 MG tablet Take 0.5 tablets (12.5 mg total) by mouth daily. 45 tablet 2   metoprolol succinate (TOPROL-XL) 25 MG 24 hr tablet Take 1 tablet (25 mg total) by mouth daily. 90 tablet 2   pantoprazole (PROTONIX) 40 MG tablet Take 1 tablet (40 mg total) by mouth daily. 90 tablet 0   potassium chloride SA (KLOR-CON M) 10 MEQ tablet Take 6 tablets (60 mEq total) by mouth daily. 180 tablet 5   simvastatin (ZOCOR) 20 MG tablet Take 1 tablet (20 mg total) by mouth daily. 90 tablet 2   torsemide (DEMADEX) 20 MG tablet Take 2 tablets (40 mg total) by mouth 2 (two) times daily. 120 tablet 5   warfarin (COUMADIN) 5 MG tablet Take one-half (1/2) of your 5mg  peach-colored warfarin tablet on Mondays, Wednesdays and Fridays. All OTHER days (Sundays, Tuesdays, Thursdays and Saturdays)--take one (1) tablet. (Patient taking differently: Patient takes 5 mg daily and take 1/2 tablet on Sundays.) 20 tablet 2   No current facility-administered medications for this encounter.   Allergies  Allergen Reactions   Lisinopril Swelling   Acetaminophen-Codeine Itching   Propoxyphene Itching    Darvocet   Tape Rash    Plastic   Tramadol Itching, Nausea And Vomiting and Rash   Social History   Socioeconomic History   Marital status: Single    Spouse name: Not on file   Number of children: Not on file   Years of education: Not on file   Highest education level: Not on file  Occupational History   Not on file  Tobacco Use   Smoking status: Every Day    Current packs/day: 0.10    Average packs/day: 0.1 packs/day for 30.0 years (3.0 ttl pk-yrs)    Types: Cigarettes   Smokeless tobacco: Never   Tobacco comments:    5 cigs per day  Vaping Use   Vaping status:  Never Used  Substance and Sexual Activity   Alcohol use: Yes    Alcohol/week: 3.0 standard drinks of alcohol    Types: 3 Standard drinks or equivalent per week    Comment: beer- ocasional   Drug use: No   Sexual activity: Not Currently    Birth control/protection: None  Other Topics Concern   Not on file  Social History Narrative   ** Merged History Encounter **       Social Determinants of Health   Financial Resource Strain: Low Risk  (12/01/2022)   Overall Financial Resource Strain (CARDIA)    Difficulty of Paying Living Expenses: Not very hard  Food Insecurity: No Food Insecurity (12/01/2022)   Hunger Vital Sign    Worried About Running Out of Food in the  Last Year: Never true    Ran Out of Food in the Last Year: Never true  Transportation Needs: No Transportation Needs (12/01/2022)   PRAPARE - Administrator, Civil Service (Medical): No    Lack of Transportation (Non-Medical): No  Physical Activity: Inactive (12/01/2022)   Exercise Vital Sign    Days of Exercise per Week: 0 days    Minutes of Exercise per Session: 10 min  Stress: Stress Concern Present (12/01/2022)   [employer] of Occupational Health - Occupational Stress Questionnaire    Feeling of Stress : To some extent  Social Connections: Socially Isolated (12/01/2022)   Social Connection and Isolation Panel [NHANES]    Frequency of Communication with Friends and Family: More than three times a week    Frequency of Social Gatherings with Friends and Family: More than three times a week    Attends Religious Services: Never    Database administrator or Organizations: No    Attends Banker Meetings: Never    Marital Status: Never married  Intimate Partner Violence: Not At Risk (12/01/2022)   Humiliation, Afraid, Rape, and Kick questionnaire    Fear of Current or Ex-Partner: No    Emotionally Abused: No    Physically Abused: No    Sexually Abused: No    Family History  Problem Relation  Age of Onset   Hypertension Mother    Cancer Father    Hypertension Father    Breast cancer Maternal Aunt 50   Heart attack Other    BP (!) 140/80   Pulse 84   Wt 69.4 kg (153 lb)   SpO2 95%   BMI 25.46 kg/m   Wt Readings from Last 3 Encounters:  01/19/23 69.4 kg (153 lb)  12/31/22 75.8 kg (167 lb 3.2 oz)  12/01/22 72.8 kg (160 lb 9.6 oz)   PHYSICAL EXAM: General:  NAD. No resp difficulty, arrived in [facility] HEENT: Normal Neck: Supple. No JVD. Carotids 2+ bilat; no bruits. No lymphadenopathy or thryomegaly appreciated. Cor: PMI nondisplaced. Regular rate & rhythm. No rubs, gallops or murmurs. + mechanical S1 Lungs: Diminished throughout Abdomen: Soft, nontender, nondistended. No hepatosplenomegaly. No bruits or masses. Good bowel sounds. Extremities: No cyanosis, clubbing, rash, edema Neuro: Alert & oriented x 3, cranial nerves grossly intact. Moves all 4 extremities w/o difficulty. Affect pleasant.  ReDs: 27%  ASSESSMENT & PLAN: Chronic Systolic Heart Failure w/ Biventricular Dysfunction/ Pulmonary HTN  - Echo (2017): EF 45-50% in setting of PE. Has mild to moderate RV dysfunction. MV prosthesis okay. - Echo (7/22): EF 35-40% this admit (Dr. Jacqulyn Cane felt EF 45%), previously 55-60% in 2020.  - R/LHC (01/09/21): with 1v CAD mRCA chronically occluded and well compensated hemodynamics with mild pulmonary HTN and normal CO - Echo (08/01/21): EF 50-55% RV moderately down. MVR ok Mean gradient 3  - Admitted 8/23 with AFL with RVR and a/c CHF. - Echo (8/23): EF 35-40%, LV global HK, severe RV dysfunction, mean mitral valve gradient 8 mmHg, severe TR. ? Tachy mediated reduction in EF. - Echo 11/23 EF 35-40% Severe RV dysfunction MVR stable Mean gradient 3. Severe TR  RVSP 48 - NYHA II, confounded by femur fracture. Volume improved, weight down 14 lbs, ReDs 27% today. - Decrease torsemide to 60 mg daily and decrease KCL to 40 daily. - Continue losartan 12.5 mg daily (no ARNi with h/o  angioedema with ACE-I) - Continue spironolactone 25 mg daily. - Continue Toprol XL 25 mg daily. -  Continue Farxiga 10 mg daily. - Suspect likely underlying OSA contributing to PH/RV failure. Also w/ known COPD. - Labs today. - Update echo next visit.  2. AFL, paroxysmal  - s/p TEE and DCCV 9/23 to NSR. - She was instructed to stop amio due to long QTc (QTC 410 msec on recent ECG). Minimize QT pro-longing agents - She does not do well in AFL/AF. - Regular on exam today. - Continue amiodarone 200 mg daily. Not ideal with her COPD. If she has recurrence, would recommend referral to EP for other AAD vs ablation options.  - Continue warfarin. INR followed by [name]. - She has home sleep study, has not completed. She says she will complete next week, encouraged her to do so - Check amio labs next visit  3. CAD  - 1 V CAD with occluded mid RCA on cath (7/22). - No s/s angina. - No ASA with warfarin use. - Continue beta blocker + statin.     4. Hx of mechanical mitral valve replacement: - Mechanical MVR in [place] in 2001. - Echo (7/22) mean gradient of 4 mmHg with no significant MR. - Echo (08/01/21): EF 50-55% RV moderately down. MVR ok Mean gradient 3  - Echo (8/23) mitral valve mean gradient 8 mmHg (was in AFL and volume overload) - TEE (9/23) poor images due to laryngospasm/airway issues. - Echo (11/23): EF 35-40%, severe RV dysfunction, MVR stable Mean gradient 3, Severe TR,  RVSP 48 - Continue Coumadin.  - Continue SBE prophylaxis.   5. Tricuspid valve regurgitation - Mild to moderate by echo 3/23. - Severe on recent echo but tolerating well. - Follow on echo.   6. Hx DVT/PE - On Coumadin. - INRs followed by [name].   7. HTN - BP a little high in clinic, but generally well-controlled at home on current meds  - No changes today.  8. COPD with ongoing tobacco use - Continue inhalers. - Smokes 3 cigs/day. - Discussed smoking cessation.  Follow up in 4 months with  Dr. Gala Romney + echo  Jacklynn Ganong, FNP  3:37 PM

## 2023-01-18 NOTE — Telephone Encounter (Signed)
Called to confirm/remind patient of their appointment at the [facility] Clinic on 01/19/23.   Patient reminded to bring all medications and/or complete list.  Confirmed patient has transportation. Gave directions, instructed to utilize valet parking.  Confirmed appointment prior to ending call.

## 2023-01-19 ENCOUNTER — Ambulatory Visit (HOSPITAL_COMMUNITY)
Admission: RE | Admit: 2023-01-19 | Discharge: 2023-01-19 | Disposition: A | Discharge status: Home / Self Care | Payer: 59 | Source: Ambulatory Visit | Attending: Family Medicine | Admitting: Family Medicine

## 2023-01-19 ENCOUNTER — Encounter (HOSPITAL_COMMUNITY): Payer: Self-pay

## 2023-01-19 VITALS — BP 140/80 | HR 84 | Wt 153.0 lb

## 2023-01-19 DIAGNOSIS — Z7901 Long term (current) use of anticoagulants: Secondary | ICD-10-CM | POA: Diagnosis not present

## 2023-01-19 DIAGNOSIS — Z86718 Personal history of other venous thrombosis and embolism: Secondary | ICD-10-CM | POA: Diagnosis not present

## 2023-01-19 DIAGNOSIS — J4489 Other specified chronic obstructive pulmonary disease: Secondary | ICD-10-CM | POA: Insufficient documentation

## 2023-01-19 DIAGNOSIS — Z79899 Other long term (current) drug therapy: Secondary | ICD-10-CM | POA: Insufficient documentation

## 2023-01-19 DIAGNOSIS — I2510 Atherosclerotic heart disease of native coronary artery without angina pectoris: Secondary | ICD-10-CM | POA: Insufficient documentation

## 2023-01-19 DIAGNOSIS — Z952 Presence of prosthetic heart valve: Secondary | ICD-10-CM | POA: Insufficient documentation

## 2023-01-19 DIAGNOSIS — I081 Rheumatic disorders of both mitral and tricuspid valves: Secondary | ICD-10-CM | POA: Diagnosis not present

## 2023-01-19 DIAGNOSIS — Z720 Tobacco use: Secondary | ICD-10-CM

## 2023-01-19 DIAGNOSIS — I4892 Unspecified atrial flutter: Secondary | ICD-10-CM | POA: Insufficient documentation

## 2023-01-19 DIAGNOSIS — Z716 Tobacco abuse counseling: Secondary | ICD-10-CM | POA: Diagnosis not present

## 2023-01-19 DIAGNOSIS — I110 Hypertensive heart disease with heart failure: Secondary | ICD-10-CM | POA: Diagnosis not present

## 2023-01-19 DIAGNOSIS — I10 Essential (primary) hypertension: Secondary | ICD-10-CM

## 2023-01-19 DIAGNOSIS — F17210 Nicotine dependence, cigarettes, uncomplicated: Secondary | ICD-10-CM | POA: Insufficient documentation

## 2023-01-19 DIAGNOSIS — Z7984 Long term (current) use of oral hypoglycemic drugs: Secondary | ICD-10-CM | POA: Diagnosis not present

## 2023-01-19 DIAGNOSIS — I071 Rheumatic tricuspid insufficiency: Secondary | ICD-10-CM

## 2023-01-19 DIAGNOSIS — J449 Chronic obstructive pulmonary disease, unspecified: Secondary | ICD-10-CM

## 2023-01-19 DIAGNOSIS — Z86711 Personal history of pulmonary embolism: Secondary | ICD-10-CM | POA: Insufficient documentation

## 2023-01-19 DIAGNOSIS — I5022 Chronic systolic (congestive) heart failure: Secondary | ICD-10-CM | POA: Insufficient documentation

## 2023-01-19 DIAGNOSIS — I272 Pulmonary hypertension, unspecified: Secondary | ICD-10-CM | POA: Insufficient documentation

## 2023-01-19 LAB — BASIC METABOLIC PANEL
Anion gap: 12 (ref 5–15)
BUN: 35 mg/dL — ABNORMAL HIGH (ref 8–23)
CO2: 22 mmol/L (ref 22–32)
Calcium: 9.7 mg/dL (ref 8.9–10.3)
Chloride: 97 mmol/L — ABNORMAL LOW (ref 98–111)
Creatinine, Ser: 1.07 mg/dL — ABNORMAL HIGH (ref 0.44–1.00)
GFR, Estimated: 55 mL/min — ABNORMAL LOW (ref >60)
Glucose, Bld: 101 mg/dL — ABNORMAL HIGH (ref 70–99)
Potassium: 3.9 mmol/L (ref 3.5–5.1)
Sodium: 131 mmol/L — ABNORMAL LOW (ref 135–145)

## 2023-01-19 LAB — BRAIN NATRIURETIC PEPTIDE: B Natriuretic Peptide: 264 pg/mL — ABNORMAL HIGH (ref 0.0–100.0)

## 2023-01-19 MED ORDER — TORSEMIDE 20 MG PO TABS: 60.0000 mg | ORAL_TABLET | Freq: Every day | ORAL | 6 refills | Status: DC

## 2023-01-19 MED ORDER — POTASSIUM CHLORIDE CRYS ER 10 MEQ PO TBCR: 40.0000 meq | EXTENDED_RELEASE_TABLET | Freq: Every day | ORAL | 6 refills | Status: DC

## 2023-01-19 NOTE — Patient Instructions (Addendum)
Thank you for coming in today  If you had labs drawn today, any labs that are abnormal the clinic will call you No news is good news  Medications: DECREASE Torsemide 60 mg daily DECREASE Potassium 40 meq daily  Follow up appointments:  Your physician recommends that you schedule a follow-up appointment in:  4 months With Dr. Gala Romney echocardiogram You will receive a reminder letter in the mail a few months in advance. If you don't receive a letter, please call our office to schedule the follow-up appointment.  Your physician has requested that you have an echocardiogram. Echocardiography is a painless test that uses sound waves to create images of your heart. It provides your doctor with information about the size and shape of your heart and how well your heart's chambers and valves are working. This procedure takes approximately one hour. There are no restrictions for this procedure.      Do the following things EVERYDAY: Weigh yourself in the morning before breakfast. Write it down and keep it in a log. Take your medicines as prescribed Eat low salt foods--Limit salt (sodium) to 2000 mg per day.  Stay as active as you can everyday Limit all fluids for the day to less than 2 liters   At the [facility], you and your health needs are our priority. As part of our continuing mission to provide you with exceptional heart care, we have created designated Provider Care Teams. These Care Teams include your primary Cardiologist (physician) and Advanced Practice Providers (APPs- Physician Assistants and Nurse Practitioners) who all work together to provide you with the care you need, when you need it.   You may see any of the following providers on your designated Care Team at your next follow up: Dr Arvilla Meres Dr Marca Ancona Dr. Marcos Eke, NP Robbie Lis, [place] [facility] [place], [place] Brynda Peon, NP Karle Plumber,  PharmD   Please be sure to bring in all your medications bottles to every appointment.    Thank you for choosing [facility]  If you have any questions or concerns before your next appointment please send [place] a message through [employer] or call our office at [phone].    TO LEAVE A MESSAGE FOR THE NURSE SELECT OPTION 2, PLEASE LEAVE A MESSAGE INCLUDING: YOUR NAME DATE OF BIRTH CALL BACK NUMBER REASON FOR CALL**this is important as we prioritize the call backs  YOU WILL RECEIVE A CALL BACK THE SAME DAY AS LONG AS YOU CALL BEFORE 4:00 PM

## 2023-01-19 NOTE — Progress Notes (Signed)
ReDS Vest / Clip - 01/19/23 1540       ReDS Vest / Clip   Station Marker A    Ruler Value 35    ReDS Value Range Low volume    ReDS Actual Value 27    Anatomical Comments sitting

## 2023-01-21 ENCOUNTER — Other Ambulatory Visit: Payer: Self-pay

## 2023-01-21 ENCOUNTER — Other Ambulatory Visit (HOSPITAL_COMMUNITY): Payer: Self-pay

## 2023-01-21 MED ORDER — TORSEMIDE 20 MG PO TABS: 60.0000 mg | ORAL_TABLET | Freq: Every day | ORAL | 6 refills | Status: DC

## 2023-01-22 DIAGNOSIS — Z853 Personal history of malignant neoplasm of breast: Secondary | ICD-10-CM | POA: Diagnosis not present

## 2023-01-22 DIAGNOSIS — N179 Acute kidney failure, unspecified: Secondary | ICD-10-CM | POA: Diagnosis not present

## 2023-01-22 DIAGNOSIS — M80051D Age-related osteoporosis with current pathological fracture, right femur, subsequent encounter for fracture with routine healing: Secondary | ICD-10-CM | POA: Diagnosis not present

## 2023-01-22 DIAGNOSIS — I2510 Atherosclerotic heart disease of native coronary artery without angina pectoris: Secondary | ICD-10-CM | POA: Diagnosis not present

## 2023-01-22 DIAGNOSIS — I484 Atypical atrial flutter: Secondary | ICD-10-CM | POA: Diagnosis not present

## 2023-01-22 DIAGNOSIS — G8929 Other chronic pain: Secondary | ICD-10-CM | POA: Diagnosis not present

## 2023-01-22 DIAGNOSIS — J4489 Other specified chronic obstructive pulmonary disease: Secondary | ICD-10-CM | POA: Diagnosis not present

## 2023-01-22 DIAGNOSIS — D6859 Other primary thrombophilia: Secondary | ICD-10-CM | POA: Diagnosis not present

## 2023-01-22 DIAGNOSIS — N183 Chronic kidney disease, stage 3 unspecified: Secondary | ICD-10-CM | POA: Diagnosis not present

## 2023-01-22 DIAGNOSIS — Z86718 Personal history of other venous thrombosis and embolism: Secondary | ICD-10-CM | POA: Diagnosis not present

## 2023-01-22 DIAGNOSIS — Z952 Presence of prosthetic heart valve: Secondary | ICD-10-CM | POA: Diagnosis not present

## 2023-01-22 DIAGNOSIS — I480 Paroxysmal atrial fibrillation: Secondary | ICD-10-CM | POA: Diagnosis not present

## 2023-01-22 DIAGNOSIS — K219 Gastro-esophageal reflux disease without esophagitis: Secondary | ICD-10-CM | POA: Diagnosis not present

## 2023-01-22 DIAGNOSIS — D631 Anemia in chronic kidney disease: Secondary | ICD-10-CM | POA: Diagnosis not present

## 2023-01-22 DIAGNOSIS — Z7951 Long term (current) use of inhaled steroids: Secondary | ICD-10-CM | POA: Diagnosis not present

## 2023-01-22 DIAGNOSIS — F17210 Nicotine dependence, cigarettes, uncomplicated: Secondary | ICD-10-CM | POA: Diagnosis not present

## 2023-01-22 DIAGNOSIS — Z7982 Long term (current) use of aspirin: Secondary | ICD-10-CM | POA: Diagnosis not present

## 2023-01-22 DIAGNOSIS — M1990 Unspecified osteoarthritis, unspecified site: Secondary | ICD-10-CM | POA: Diagnosis not present

## 2023-01-22 DIAGNOSIS — I5040 Unspecified combined systolic (congestive) and diastolic (congestive) heart failure: Secondary | ICD-10-CM | POA: Diagnosis not present

## 2023-01-22 DIAGNOSIS — Z7901 Long term (current) use of anticoagulants: Secondary | ICD-10-CM | POA: Diagnosis not present

## 2023-01-22 DIAGNOSIS — I130 Hypertensive heart and chronic kidney disease with heart failure and stage 1 through stage 4 chronic kidney disease, or unspecified chronic kidney disease: Secondary | ICD-10-CM | POA: Diagnosis not present

## 2023-01-22 DIAGNOSIS — E785 Hyperlipidemia, unspecified: Secondary | ICD-10-CM | POA: Diagnosis not present

## 2023-01-22 DIAGNOSIS — Z86711 Personal history of pulmonary embolism: Secondary | ICD-10-CM | POA: Diagnosis not present

## 2023-01-26 ENCOUNTER — Telehealth: Payer: Self-pay | Admitting: *Deleted

## 2023-01-26 MED ORDER — HYDROCODONE-ACETAMINOPHEN 5-325 MG PO TABS: 1.0000 | ORAL_TABLET | ORAL | 0 refills | Status: DC | PRN
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
## 2023-01-27 DIAGNOSIS — F17210 Nicotine dependence, cigarettes, uncomplicated: Secondary | ICD-10-CM | POA: Diagnosis not present

## 2023-01-27 DIAGNOSIS — D6859 Other primary thrombophilia: Secondary | ICD-10-CM | POA: Diagnosis not present

## 2023-01-27 DIAGNOSIS — I484 Atypical atrial flutter: Secondary | ICD-10-CM | POA: Diagnosis not present

## 2023-01-27 DIAGNOSIS — E785 Hyperlipidemia, unspecified: Secondary | ICD-10-CM | POA: Diagnosis not present

## 2023-01-27 DIAGNOSIS — I2510 Atherosclerotic heart disease of native coronary artery without angina pectoris: Secondary | ICD-10-CM | POA: Diagnosis not present

## 2023-01-27 DIAGNOSIS — Z7901 Long term (current) use of anticoagulants: Secondary | ICD-10-CM | POA: Diagnosis not present

## 2023-01-27 DIAGNOSIS — M1990 Unspecified osteoarthritis, unspecified site: Secondary | ICD-10-CM | POA: Diagnosis not present

## 2023-01-27 DIAGNOSIS — Z952 Presence of prosthetic heart valve: Secondary | ICD-10-CM | POA: Diagnosis not present

## 2023-01-27 DIAGNOSIS — Z853 Personal history of malignant neoplasm of breast: Secondary | ICD-10-CM | POA: Diagnosis not present

## 2023-01-27 DIAGNOSIS — N179 Acute kidney failure, unspecified: Secondary | ICD-10-CM | POA: Diagnosis not present

## 2023-01-27 DIAGNOSIS — D631 Anemia in chronic kidney disease: Secondary | ICD-10-CM | POA: Diagnosis not present

## 2023-01-27 DIAGNOSIS — Z7982 Long term (current) use of aspirin: Secondary | ICD-10-CM | POA: Diagnosis not present

## 2023-01-27 DIAGNOSIS — I5040 Unspecified combined systolic (congestive) and diastolic (congestive) heart failure: Secondary | ICD-10-CM | POA: Diagnosis not present

## 2023-01-27 DIAGNOSIS — M80051D Age-related osteoporosis with current pathological fracture, right femur, subsequent encounter for fracture with routine healing: Secondary | ICD-10-CM | POA: Diagnosis not present

## 2023-01-27 DIAGNOSIS — Z86718 Personal history of other venous thrombosis and embolism: Secondary | ICD-10-CM | POA: Diagnosis not present

## 2023-01-27 DIAGNOSIS — H903 Sensorineural hearing loss, bilateral: Secondary | ICD-10-CM | POA: Diagnosis not present

## 2023-01-27 DIAGNOSIS — Z86711 Personal history of pulmonary embolism: Secondary | ICD-10-CM | POA: Diagnosis not present

## 2023-01-27 DIAGNOSIS — G8929 Other chronic pain: Secondary | ICD-10-CM | POA: Diagnosis not present

## 2023-01-27 DIAGNOSIS — I480 Paroxysmal atrial fibrillation: Secondary | ICD-10-CM | POA: Diagnosis not present

## 2023-01-27 DIAGNOSIS — N183 Chronic kidney disease, stage 3 unspecified: Secondary | ICD-10-CM | POA: Diagnosis not present

## 2023-01-27 DIAGNOSIS — J4489 Other specified chronic obstructive pulmonary disease: Secondary | ICD-10-CM | POA: Diagnosis not present

## 2023-01-27 DIAGNOSIS — K219 Gastro-esophageal reflux disease without esophagitis: Secondary | ICD-10-CM | POA: Diagnosis not present

## 2023-01-27 DIAGNOSIS — Z7951 Long term (current) use of inhaled steroids: Secondary | ICD-10-CM | POA: Diagnosis not present

## 2023-01-27 DIAGNOSIS — I130 Hypertensive heart and chronic kidney disease with heart failure and stage 1 through stage 4 chronic kidney disease, or unspecified chronic kidney disease: Secondary | ICD-10-CM | POA: Diagnosis not present

## 2023-01-28 MED ORDER — GABAPENTIN 300 MG PO CAPS: 300.0000 mg | ORAL_CAPSULE | Freq: Three times a day (TID) | ORAL | 0 refills | Status: DC

## 2023-02-03 DIAGNOSIS — M170 Bilateral primary osteoarthritis of knee: Secondary | ICD-10-CM | POA: Diagnosis not present

## 2023-02-03 DIAGNOSIS — M25562 Pain in left knee: Secondary | ICD-10-CM | POA: Diagnosis not present

## 2023-02-04 ENCOUNTER — Encounter: Payer: 59 | Admitting: Student

## 2023-02-05 DIAGNOSIS — Z7901 Long term (current) use of anticoagulants: Secondary | ICD-10-CM | POA: Diagnosis not present

## 2023-02-05 DIAGNOSIS — Z853 Personal history of malignant neoplasm of breast: Secondary | ICD-10-CM | POA: Diagnosis not present

## 2023-02-05 DIAGNOSIS — K219 Gastro-esophageal reflux disease without esophagitis: Secondary | ICD-10-CM | POA: Diagnosis not present

## 2023-02-05 DIAGNOSIS — M80051D Age-related osteoporosis with current pathological fracture, right femur, subsequent encounter for fracture with routine healing: Secondary | ICD-10-CM | POA: Diagnosis not present

## 2023-02-05 DIAGNOSIS — Z952 Presence of prosthetic heart valve: Secondary | ICD-10-CM | POA: Diagnosis not present

## 2023-02-05 DIAGNOSIS — I2510 Atherosclerotic heart disease of native coronary artery without angina pectoris: Secondary | ICD-10-CM | POA: Diagnosis not present

## 2023-02-05 DIAGNOSIS — D6859 Other primary thrombophilia: Secondary | ICD-10-CM | POA: Diagnosis not present

## 2023-02-05 DIAGNOSIS — I484 Atypical atrial flutter: Secondary | ICD-10-CM | POA: Diagnosis not present

## 2023-02-05 DIAGNOSIS — G8929 Other chronic pain: Secondary | ICD-10-CM | POA: Diagnosis not present

## 2023-02-05 DIAGNOSIS — I5040 Unspecified combined systolic (congestive) and diastolic (congestive) heart failure: Secondary | ICD-10-CM | POA: Diagnosis not present

## 2023-02-05 DIAGNOSIS — N183 Chronic kidney disease, stage 3 unspecified: Secondary | ICD-10-CM | POA: Diagnosis not present

## 2023-02-05 DIAGNOSIS — I480 Paroxysmal atrial fibrillation: Secondary | ICD-10-CM | POA: Diagnosis not present

## 2023-02-05 DIAGNOSIS — Z86711 Personal history of pulmonary embolism: Secondary | ICD-10-CM | POA: Diagnosis not present

## 2023-02-05 DIAGNOSIS — I130 Hypertensive heart and chronic kidney disease with heart failure and stage 1 through stage 4 chronic kidney disease, or unspecified chronic kidney disease: Secondary | ICD-10-CM | POA: Diagnosis not present

## 2023-02-05 DIAGNOSIS — E785 Hyperlipidemia, unspecified: Secondary | ICD-10-CM | POA: Diagnosis not present

## 2023-02-05 DIAGNOSIS — D631 Anemia in chronic kidney disease: Secondary | ICD-10-CM | POA: Diagnosis not present

## 2023-02-05 DIAGNOSIS — Z7982 Long term (current) use of aspirin: Secondary | ICD-10-CM | POA: Diagnosis not present

## 2023-02-05 DIAGNOSIS — J4489 Other specified chronic obstructive pulmonary disease: Secondary | ICD-10-CM | POA: Diagnosis not present

## 2023-02-05 DIAGNOSIS — F17210 Nicotine dependence, cigarettes, uncomplicated: Secondary | ICD-10-CM | POA: Diagnosis not present

## 2023-02-05 DIAGNOSIS — M1990 Unspecified osteoarthritis, unspecified site: Secondary | ICD-10-CM | POA: Diagnosis not present

## 2023-02-05 DIAGNOSIS — Z7951 Long term (current) use of inhaled steroids: Secondary | ICD-10-CM | POA: Diagnosis not present

## 2023-02-05 DIAGNOSIS — Z86718 Personal history of other venous thrombosis and embolism: Secondary | ICD-10-CM | POA: Diagnosis not present

## 2023-02-05 DIAGNOSIS — N179 Acute kidney failure, unspecified: Secondary | ICD-10-CM | POA: Diagnosis not present

## 2023-02-10 DIAGNOSIS — I5040 Unspecified combined systolic (congestive) and diastolic (congestive) heart failure: Secondary | ICD-10-CM | POA: Diagnosis not present

## 2023-02-10 DIAGNOSIS — G8929 Other chronic pain: Secondary | ICD-10-CM | POA: Diagnosis not present

## 2023-02-10 DIAGNOSIS — I130 Hypertensive heart and chronic kidney disease with heart failure and stage 1 through stage 4 chronic kidney disease, or unspecified chronic kidney disease: Secondary | ICD-10-CM | POA: Diagnosis not present

## 2023-02-10 DIAGNOSIS — K219 Gastro-esophageal reflux disease without esophagitis: Secondary | ICD-10-CM | POA: Diagnosis not present

## 2023-02-10 DIAGNOSIS — Z952 Presence of prosthetic heart valve: Secondary | ICD-10-CM | POA: Diagnosis not present

## 2023-02-10 DIAGNOSIS — I484 Atypical atrial flutter: Secondary | ICD-10-CM | POA: Diagnosis not present

## 2023-02-10 DIAGNOSIS — Z7982 Long term (current) use of aspirin: Secondary | ICD-10-CM | POA: Diagnosis not present

## 2023-02-10 DIAGNOSIS — Z7901 Long term (current) use of anticoagulants: Secondary | ICD-10-CM | POA: Diagnosis not present

## 2023-02-10 DIAGNOSIS — Z86711 Personal history of pulmonary embolism: Secondary | ICD-10-CM | POA: Diagnosis not present

## 2023-02-10 DIAGNOSIS — I480 Paroxysmal atrial fibrillation: Secondary | ICD-10-CM | POA: Diagnosis not present

## 2023-02-10 DIAGNOSIS — Z853 Personal history of malignant neoplasm of breast: Secondary | ICD-10-CM | POA: Diagnosis not present

## 2023-02-10 DIAGNOSIS — M1990 Unspecified osteoarthritis, unspecified site: Secondary | ICD-10-CM | POA: Diagnosis not present

## 2023-02-10 DIAGNOSIS — M80051D Age-related osteoporosis with current pathological fracture, right femur, subsequent encounter for fracture with routine healing: Secondary | ICD-10-CM | POA: Diagnosis not present

## 2023-02-10 DIAGNOSIS — F17210 Nicotine dependence, cigarettes, uncomplicated: Secondary | ICD-10-CM | POA: Diagnosis not present

## 2023-02-10 DIAGNOSIS — D6859 Other primary thrombophilia: Secondary | ICD-10-CM | POA: Diagnosis not present

## 2023-02-10 DIAGNOSIS — Z7951 Long term (current) use of inhaled steroids: Secondary | ICD-10-CM | POA: Diagnosis not present

## 2023-02-10 DIAGNOSIS — I2510 Atherosclerotic heart disease of native coronary artery without angina pectoris: Secondary | ICD-10-CM | POA: Diagnosis not present

## 2023-02-10 DIAGNOSIS — D631 Anemia in chronic kidney disease: Secondary | ICD-10-CM | POA: Diagnosis not present

## 2023-02-10 DIAGNOSIS — E785 Hyperlipidemia, unspecified: Secondary | ICD-10-CM | POA: Diagnosis not present

## 2023-02-10 DIAGNOSIS — N183 Chronic kidney disease, stage 3 unspecified: Secondary | ICD-10-CM | POA: Diagnosis not present

## 2023-02-10 DIAGNOSIS — J4489 Other specified chronic obstructive pulmonary disease: Secondary | ICD-10-CM | POA: Diagnosis not present

## 2023-02-10 DIAGNOSIS — N179 Acute kidney failure, unspecified: Secondary | ICD-10-CM | POA: Diagnosis not present

## 2023-02-10 DIAGNOSIS — Z86718 Personal history of other venous thrombosis and embolism: Secondary | ICD-10-CM | POA: Diagnosis not present

## 2023-02-15 ENCOUNTER — Other Ambulatory Visit: Payer: Self-pay | Admitting: Student

## 2023-02-15 DIAGNOSIS — I480 Paroxysmal atrial fibrillation: Secondary | ICD-10-CM | POA: Diagnosis not present

## 2023-02-15 DIAGNOSIS — Z952 Presence of prosthetic heart valve: Secondary | ICD-10-CM | POA: Diagnosis not present

## 2023-02-15 DIAGNOSIS — Z7901 Long term (current) use of anticoagulants: Secondary | ICD-10-CM | POA: Diagnosis not present

## 2023-02-15 DIAGNOSIS — I484 Atypical atrial flutter: Secondary | ICD-10-CM | POA: Diagnosis not present

## 2023-02-15 DIAGNOSIS — N183 Chronic kidney disease, stage 3 unspecified: Secondary | ICD-10-CM | POA: Diagnosis not present

## 2023-02-15 DIAGNOSIS — E785 Hyperlipidemia, unspecified: Secondary | ICD-10-CM | POA: Diagnosis not present

## 2023-02-15 DIAGNOSIS — G8929 Other chronic pain: Secondary | ICD-10-CM | POA: Diagnosis not present

## 2023-02-15 DIAGNOSIS — F17210 Nicotine dependence, cigarettes, uncomplicated: Secondary | ICD-10-CM | POA: Diagnosis not present

## 2023-02-15 DIAGNOSIS — I5040 Unspecified combined systolic (congestive) and diastolic (congestive) heart failure: Secondary | ICD-10-CM | POA: Diagnosis not present

## 2023-02-15 DIAGNOSIS — D631 Anemia in chronic kidney disease: Secondary | ICD-10-CM | POA: Diagnosis not present

## 2023-02-15 DIAGNOSIS — Z853 Personal history of malignant neoplasm of breast: Secondary | ICD-10-CM | POA: Diagnosis not present

## 2023-02-15 DIAGNOSIS — N179 Acute kidney failure, unspecified: Secondary | ICD-10-CM | POA: Diagnosis not present

## 2023-02-15 DIAGNOSIS — D6859 Other primary thrombophilia: Secondary | ICD-10-CM | POA: Diagnosis not present

## 2023-02-15 DIAGNOSIS — J4489 Other specified chronic obstructive pulmonary disease: Secondary | ICD-10-CM | POA: Diagnosis not present

## 2023-02-15 DIAGNOSIS — I130 Hypertensive heart and chronic kidney disease with heart failure and stage 1 through stage 4 chronic kidney disease, or unspecified chronic kidney disease: Secondary | ICD-10-CM | POA: Diagnosis not present

## 2023-02-15 DIAGNOSIS — Z7982 Long term (current) use of aspirin: Secondary | ICD-10-CM | POA: Diagnosis not present

## 2023-02-15 DIAGNOSIS — M80051D Age-related osteoporosis with current pathological fracture, right femur, subsequent encounter for fracture with routine healing: Secondary | ICD-10-CM | POA: Diagnosis not present

## 2023-02-15 DIAGNOSIS — Z7951 Long term (current) use of inhaled steroids: Secondary | ICD-10-CM | POA: Diagnosis not present

## 2023-02-15 DIAGNOSIS — M1990 Unspecified osteoarthritis, unspecified site: Secondary | ICD-10-CM | POA: Diagnosis not present

## 2023-02-15 DIAGNOSIS — Z86711 Personal history of pulmonary embolism: Secondary | ICD-10-CM | POA: Diagnosis not present

## 2023-02-15 DIAGNOSIS — I2510 Atherosclerotic heart disease of native coronary artery without angina pectoris: Secondary | ICD-10-CM | POA: Diagnosis not present

## 2023-02-15 DIAGNOSIS — Z86718 Personal history of other venous thrombosis and embolism: Secondary | ICD-10-CM | POA: Diagnosis not present

## 2023-02-15 DIAGNOSIS — K219 Gastro-esophageal reflux disease without esophagitis: Secondary | ICD-10-CM | POA: Diagnosis not present

## 2023-02-17 ENCOUNTER — Telehealth: Payer: Self-pay

## 2023-02-17 DIAGNOSIS — M170 Bilateral primary osteoarthritis of knee: Secondary | ICD-10-CM | POA: Diagnosis not present

## 2023-02-18 MED ORDER — HYDROCODONE-ACETAMINOPHEN 5-325 MG PO TABS: 1.0000 | ORAL_TABLET | ORAL | 0 refills | Status: DC | PRN

## 2023-02-18 NOTE — Telephone Encounter (Signed)
Pt says pharmacist states you need to call to okay them to fill because its too early. Is this okay? She's going out of town.

## 2023-02-18 NOTE — Telephone Encounter (Signed)
How early does she want it and where is she going/for how long??? ML

## 2023-02-18 NOTE — Telephone Encounter (Signed)
Patient leaves for [place] tomorrow and will be gone for a week. She is trying to get it today if possible.

## 2023-02-19 ENCOUNTER — Other Ambulatory Visit: Payer: Self-pay | Admitting: Internal Medicine

## 2023-02-19 DIAGNOSIS — J441 Chronic obstructive pulmonary disease with (acute) exacerbation: Secondary | ICD-10-CM

## 2023-02-24 ENCOUNTER — Other Ambulatory Visit: Payer: Self-pay

## 2023-02-24 MED ORDER — GABAPENTIN 300 MG PO CAPS: 300.0000 mg | ORAL_CAPSULE | Freq: Three times a day (TID) | ORAL | 0 refills | Status: DC

## 2023-02-24 NOTE — Telephone Encounter (Signed)
Patient is scheduled to come in 03/08/23 @ 8:45 with Dr.Ghiles.

## 2023-02-26 ENCOUNTER — Other Ambulatory Visit (HOSPITAL_COMMUNITY): Payer: Self-pay | Admitting: Family Medicine

## 2023-02-26 ENCOUNTER — Encounter: Payer: 59 | Admitting: Student

## 2023-02-27 ENCOUNTER — Encounter (HOSPITAL_COMMUNITY): Payer: Self-pay | Admitting: Emergency Medicine

## 2023-02-27 ENCOUNTER — Other Ambulatory Visit: Payer: Self-pay

## 2023-02-27 ENCOUNTER — Ambulatory Visit (HOSPITAL_COMMUNITY)
Admission: EM | Admit: 2023-02-27 | Discharge: 2023-02-27 | Disposition: A | Discharge status: Home / Self Care | Payer: 59 | Attending: Emergency Medicine | Admitting: Emergency Medicine

## 2023-02-27 ENCOUNTER — Ambulatory Visit (INDEPENDENT_AMBULATORY_CARE_PROVIDER_SITE_OTHER): Payer: 59

## 2023-02-27 DIAGNOSIS — M7721 Periarthritis, right wrist: Secondary | ICD-10-CM

## 2023-02-27 DIAGNOSIS — M25531 Pain in right wrist: Secondary | ICD-10-CM

## 2023-02-27 DIAGNOSIS — M7989 Other specified soft tissue disorders: Secondary | ICD-10-CM | POA: Diagnosis not present

## 2023-02-27 MED ORDER — PREDNISONE 20 MG PO TABS: 20.0000 mg | ORAL_TABLET | Freq: Every day | ORAL | 0 refills | Status: DC

## 2023-02-27 NOTE — Discharge Instructions (Addendum)
I will call you if anything results abnormal on your x-ray (may take 1-2 hours)  In the meantime please take prednisone once daily for 3 days. Please follow with [facility] either Monday after your pain clinic visit, or first thing Tuesday  You can continue the wrist splint if it's comfortable  Elevate the wrist and apply ice

## 2023-02-27 NOTE — ED Triage Notes (Signed)
Patient has pain and swelling in right hand and wrist.  History of the same.  Mentions she had seen orthopedic specialist in the past, but it has been a long time.  This episode of hand and wrist pain started 3 days ago.  Has been wearing an old splint on right hand/wrist.  Patient goes to pain management .  Patient is on gabapentin and Vicodin.    Denies a fall

## 2023-02-27 NOTE — ED Provider Notes (Signed)
[facility]    CSN: [id number] Arrival date & time: 02/27/23  1339     History   Chief Complaint Chief Complaint  Patient presents with   Hand Pain    HPI Kim Anthony is a 72 y.o. female.  3 day history of right wrist swelling, pain History of this and she saw hand specialist but was several years ago. Doesn't remember what they told her Denies fall, injury, or trauma. No insect bites or stings. Has been using an old wrist splint that helps  History of arthritis, chronic pain, ganglion cyst, possibly gout  Follows with pain clinic. Next visit in 2 days 30 day hydrocodone refilled last week  She sees [facility] for knee issues  In February she was seen here for same complaint in the left wrist and was prescribed course of prednisone which resolved symptoms   Past Medical History:  Diagnosis Date   Arthritis    Asthma    Breast cancer (HCC) 2001   Left Breast Cancer   CHF (congestive heart failure) (HCC)    COPD (chronic obstructive pulmonary disease) (HCC)    Coronary artery disease    GERD (gastroesophageal reflux disease)    History of mitral valve replacement with mechanical valve    HLD (hyperlipidemia)    Hypertension    Pulmonary embolism (HCC) 2021    Patient Active Problem List   Diagnosis Date Noted   Bilateral hearing loss 12/01/2022   Hypokalemia 11/18/2022   Thrombocytosis 10/16/2022   Hyperkalemia 10/08/2022   AKI (acute kidney injury) (HCC) 10/07/2022   Alteration in anticoagulation 09/25/2022   History of mitral valve replacement with mechanical valve 09/24/2022   Anticoagulated 09/24/2022   Closed fracture of right hip (HCC) 09/23/2022   Closed intertrochanteric fracture of right femur, initial encounter (HCC) 09/22/2022   H/O mitral valve replacement with mechanical valve 09/22/2022   Chronic HFrEF (heart failure with reduced ejection fraction) (HCC) 09/22/2022   Endometrial thickening on ultrasound 07/07/2022    Gastroesophageal reflux disease without esophagitis 06/29/2022   Chronic pain of both knees 06/29/2022   Vaginal bleeding 04/01/2022   A-fib (HCC) 03/13/2022   Facial edema 03/07/2022   Atypical atrial flutter (HCC)    Combined systolic and diastolic heart failure (HCC) 02/19/2022   CHF (congestive heart failure) (HCC) 02/19/2022   Combined systolic and diastolic congestive heart failure (HCC) 02/18/2022   Acute pulmonary edema (HCC)    Edema of both lower extremities 01/13/2022   Hypotension 01/13/2022   Subacute cough 11/28/2021   COPD exacerbation (HCC) 11/05/2021   Conjunctival hemorrhage of left eye 06/25/2021   Osteoarthritis of left knee 04/24/2021   Seronegative inflammatory arthritis 02/17/2021   High risk medication use 02/17/2021   Elevated transaminase level 01/27/2021   Acute idiopathic gout of multiple sites 09/30/2020   Chronic pain syndrome 01/17/2020   Monoarthritis of wrist 10/18/2019   Ganglion cyst 09/21/2019   Hip pain 08/23/2019   Thoracic ascending aortic aneurysm (HCC) 07/05/2019   Macrocytic anemia 02/16/2019   Breast cancer (HCC)    Acquired absence of left breast 12/20/2018   COPD (chronic obstructive pulmonary disease) (HCC) 11/25/2017   S/P MVR (mitral valve replacement)    Chronic anticoagulation    Tobacco use disorder 02/25/2017   Lymphadenopathy, mediastinal 02/25/2017   Essential hypertension 02/01/2016   Allergic rhinitis 02/01/2016   Healthcare maintenance 01/30/2016   Chronic low back pain 07/22/2015   Pulmonary embolism (HCC) 07/11/2015   Chronic combined systolic and diastolic heart  failure (HCC) 07/11/2015   Headache, common migraine 07/11/2015   HLD (hyperlipidemia) 07/11/2015    Past Surgical History:  Procedure Laterality Date   APPLICATION OF A-CELL OF CHEST/ABDOMEN Left 01/27/2019   Procedure: APPLICATION OF A-CELL OF CHEST;  Surgeon: Peggye Form, DO;  Location: [facility] OR;  Service: Plastics;  Laterality: Left;    APPLICATION OF WOUND VAC N/A 01/27/2019   Procedure: APPLICATION OF WOUND VAC;  Surgeon: Peggye Form, DO;  Location: [facility] OR;  Service: Plastics;  Laterality: N/A;   BREAST SURGERY Left 2001   for CA   CARDIOVERSION N/A 02/26/2022   Procedure: CARDIOVERSION;  Surgeon: Little Ishikawa, MD;  Location: [facility] ENDOSCOPY;  Service: Cardiovascular;  Laterality: N/A;   COLONOSCOPY WITH PROPOFOL N/A 06/07/2018   Procedure: COLONOSCOPY WITH PROPOFOL;  Surgeon: Charlott Rakes, MD;  Location: [facility] ENDOSCOPY;  Service: Endoscopy;  Laterality: N/A;   DEBRIDEMENT AND CLOSURE WOUND Left 12/28/2018   Procedure: Debridement And Closure Wound;  Surgeon: Abigail Miyamoto, MD;  Location: [facility] OR;  Service: General;  Laterality: Left;   INCISION AND DRAINAGE OF WOUND Left 01/27/2019   Procedure: IRRIGATION AND DEBRIDEMENT OF CHEST WALL;  Surgeon: Peggye Form, DO;  Location: [facility] OR;  Service: Plastics;  Laterality: Left;   INTRAMEDULLARY (IM) NAIL INTERTROCHANTERIC N/A 09/23/2022   Procedure: INTRAMEDULLARY (IM) NAIL INTERTROCHANTERIC;  Surgeon: Tarry Kos, MD;  Location: [facility] OR;  Service: Orthopedics;  Laterality: N/A;   KNEE ARTHROSCOPY WITH MEDIAL MENISECTOMY Left 08/06/2021   Procedure: KNEE ARTHROSCOPY WITH PARTIAL MEDIAL MENISECTOMY, DEBRIDEMENT;  Surgeon: Jene Every, MD;  Location: [facility] ORS;  Service: Orthopedics;  Laterality: Left;  1 HR   LATISSIMUS FLAP TO BREAST Left 01/18/2019   Procedure: LEFT LATISSIMUS FLAP TO BREAST;  Surgeon: Peggye Form, DO;  Location: [facility] OR;  Service: Plastics;  Laterality: Left;   MASTECTOMY Left    MITRAL VALVE REPLACEMENT     mechanical   POLYPECTOMY  06/07/2018   Procedure: POLYPECTOMY;  Surgeon: Charlott Rakes, MD;  Location: [facility] ENDOSCOPY;  Service: Endoscopy;;   RIGHT/LEFT HEART CATH AND CORONARY ANGIOGRAPHY N/A 01/09/2021   Procedure: RIGHT/LEFT HEART CATH AND CORONARY ANGIOGRAPHY;  Surgeon: Dolores Patty, MD;  Location: [facility] INVASIVE  CV LAB;  Service: Cardiovascular;  Laterality: N/A;   WOUND DEBRIDEMENT Left 12/28/2018   Procedure: EXCISION OF LEFT CHRONIC CHEST WALL WOUND;  Surgeon: Abigail Miyamoto, MD;  Location: [facility] OR;  Service: General;  Laterality: Left;    OB History     Gravida  2   Para  2   Term  2   Preterm      AB      Living         SAB      IAB      Ectopic      Multiple      Live Births  2            Home Medications    Prior to Admission medications   Medication Sig Start Date End Date Taking? Authorizing Provider  predniSONE (DELTASONE) 20 MG tablet Take 1 tablet (20 mg total) by mouth daily with breakfast for 3 days. 02/27/23 03/02/23 Yes Alexis Reber, Lurena Joiner, PA-C  albuterol (VENTOLIN HFA) 108 (90 Base) MCG/ACT inhaler Inhale 2 puffs into the lungs every 6 (six) hours as needed for wheezing or shortness of breath. 07/02/22   Marolyn Haller, MD  dapagliflozin propanediol (FARXIGA) 10 MG TABS tablet Take 1 tablet (10 mg total) by mouth  daily. 12/03/22   Willette Cluster, MD  DULoxetine (CYMBALTA) 20 MG capsule Take 20 mg by mouth at bedtime. As needed    [provider]  fluticasone (FLONASE) 50 MCG/ACT nasal spray Place 2 sprays into both nostrils daily. 12/03/22 12/03/23  Willette Cluster, MD  gabapentin (NEURONTIN) 300 MG capsule Take 1 capsule (300 mg total) by mouth 3 (three) times daily. 02/24/23   Kathleen Lime, MD  HYDROcodone-acetaminophen (NORCO/VICODIN) 5-325 MG tablet Take 1 tablet by mouth every 4 (four) hours as needed for moderate pain. 02/18/23   Lovorn, Aundra Millet, MD  losartan (COZAAR) 25 MG tablet Take 1/2 (one-half) tablet by mouth once daily 02/26/23   Bensimhon, Bevelyn Buckles, MD  metoprolol succinate (TOPROL-XL) 25 MG 24 hr tablet Take 1 tablet (25 mg total) by mouth daily. 12/03/22   Willette Cluster, MD  pantoprazole (PROTONIX) 40 MG tablet Take 1 tablet (40 mg total) by mouth daily. 12/03/22   Willette Cluster, MD  potassium chloride (KLOR-CON M) 10 MEQ tablet Take 4 tablets (40  mEq total) by mouth daily. 01/19/23 08/17/23  Jacklynn Ganong, FNP  simvastatin (ZOCOR) 20 MG tablet Take 1 tablet (20 mg total) by mouth daily. 12/03/22   Willette Cluster, MD  torsemide (DEMADEX) 20 MG tablet Take 3 tablets (60 mg total) by mouth daily. 01/21/23   Milford, Anderson Malta, FNP  TRELEGY ELLIPTA 100-62.5-25 MCG/ACT AEPB INHALE 1 PUFF BY MOUTH INTO THE LUNGS ONCE DAILY 02/22/23   Kathleen Lime, MD  warfarin (COUMADIN) 5 MG tablet Take one-half (1/2) of your 5mg  peach-colored warfarin tablet on Mondays, Wednesdays and Fridays. All OTHER days (Sundays, Tuesdays, Thursdays and Saturdays)--take one (1) tablet. Patient taking differently: Patient takes 5 mg daily and take 1/2 tablet on Sundays. 12/01/22   Elicia Lamp, RPH-CPP    Family History Family History  Problem Relation Age of Onset   Hypertension Mother    Cancer Father    Hypertension Father    Breast cancer Maternal Aunt 50   Heart attack Other     Social History Social History   Tobacco Use   Smoking status: Every Day    Current packs/day: 0.10    Average packs/day: 0.1 packs/day for 30.0 years (3.0 ttl pk-yrs)    Types: Cigarettes   Smokeless tobacco: Never   Tobacco comments:    5 cigs per day  Vaping Use   Vaping status: Never Used  Substance Use Topics   Alcohol use: Yes    Alcohol/week: 3.0 standard drinks of alcohol    Types: 3 Standard drinks or equivalent per week    Comment: beer- ocasional   Drug use: No     Allergies   Lisinopril, Acetaminophen-codeine, Propoxyphene, Tape, and Tramadol   Review of Systems Review of Systems Per HPI  Physical Exam Triage Vital Signs ED Triage Vitals  Encounter Vitals Group     BP      Systolic BP Percentile      Diastolic BP Percentile      Pulse      Resp      Temp      Temp src      SpO2      Weight      Height      Head Circumference      Peak Flow      Pain Score      Pain Loc      Pain Education      Exclude from Growth Chart  No data  found.  Updated Vital Signs BP 103/68 (BP Location: Right Arm)   Pulse 84   Temp 97.7 F (36.5 C) (Oral)   Resp 20   SpO2 92%    Physical Exam Vitals and nursing note reviewed.  Constitutional:      General: She is not in acute distress. Cardiovascular:     Rate and Rhythm: Normal rate and regular rhythm.     Pulses: Normal pulses.     Heart sounds: Normal heart sounds.  Pulmonary:     Effort: Pulmonary effort is normal.     Breath sounds: Normal breath sounds.  Musculoskeletal:     Right hand: Swelling and tenderness present. Decreased strength. Normal sensation. Normal capillary refill. Normal pulse.     Cervical back: Normal range of motion.     Comments: Area of swelling and warmth on dorsal hand, in area of previous ganglion cyst. Tenderness to palpation. Decreased grip strength due to pain. Cap refill < 2 seconds, strong radial pulse, sensation intact distally   Skin:    Capillary Refill: Capillary refill takes less than 2 seconds.  Neurological:     Mental Status: She is alert and oriented to person, place, and time.     UC Treatments / Results  Labs (all labs ordered are listed, but only abnormal results are displayed) Labs Reviewed - No data to display  EKG   Radiology DG Wrist Complete Right  Result Date: 02/27/2023 CLINICAL DATA:  pain and swelling x 3 days. no known injury EXAM: RIGHT WRIST - COMPLETE 3+ VIEW COMPARISON:  10/17/2019 FINDINGS: There is no evidence of fracture or dislocation. There is no evidence of arthropathy or other focal bone abnormality. Soft tissues are unremarkable. IMPRESSION: Negative. Electronically Signed   By: Corlis Leak M.D.   On: 02/27/2023 16:31    Procedures Procedures (including critical care time)  Medications Ordered in UC Medications - No data to display  Initial Impression / Assessment and Plan / UC Course  I have reviewed the triage vital signs and the nursing notes.  Pertinent labs & imaging results that were  available during my care of the patient were reviewed by me and considered in my medical decision making (see chart for details).  Right wrist xray is negative. Images independently reviewed by me, agree with radiology interpretation. Consider gout or other inflammatory condition Low concern for septic arthritis or other joint space infection, defer antibiotics Low dose short course prednisone. 20 mg daily x 3 days.  I would like her to see hand specialist this week. Recommend to ice and elevate, follow closely with orthopedics. She also has her pain management visit in 2 days Patient agreeable to plan  Final Clinical Impressions(s) / UC Diagnoses   Final diagnoses:  Right wrist pain  Periarthritis of right wrist     Discharge Instructions      I will call you if anything results abnormal on your x-ray (may take 1-2 hours)  In the meantime please take prednisone once daily for 3 days. Please follow with [facility] either Monday after your pain clinic visit, or first thing Tuesday  You can continue the wrist splint if it's comfortable  Elevate the wrist and apply ice      ED Prescriptions     Medication Sig Dispense Auth. Provider   predniSONE (DELTASONE) 20 MG tablet Take 1 tablet (20 mg total) by mouth daily with breakfast for 3 days. 3 tablet Frankie Scipio, Lurena Joiner, PA-C  I have reviewed the PDMP during this encounter.   Cinch Ormond, Lurena Joiner, PA-C 02/27/23 1640

## 2023-03-01 ENCOUNTER — Encounter: Payer: 59 | Attending: Physical Medicine and Rehabilitation | Admitting: Physical Medicine and Rehabilitation

## 2023-03-01 ENCOUNTER — Encounter: Payer: Self-pay | Admitting: Physical Medicine and Rehabilitation

## 2023-03-01 VITALS — BP 131/80 | HR 77 | Ht 65.0 in | Wt 153.0 lb

## 2023-03-01 DIAGNOSIS — M10031 Idiopathic gout, right wrist: Secondary | ICD-10-CM | POA: Insufficient documentation

## 2023-03-01 DIAGNOSIS — M25561 Pain in right knee: Secondary | ICD-10-CM | POA: Diagnosis not present

## 2023-03-01 DIAGNOSIS — G8929 Other chronic pain: Secondary | ICD-10-CM | POA: Diagnosis not present

## 2023-03-01 DIAGNOSIS — M545 Low back pain, unspecified: Secondary | ICD-10-CM | POA: Insufficient documentation

## 2023-03-01 DIAGNOSIS — M25562 Pain in left knee: Secondary | ICD-10-CM | POA: Diagnosis not present

## 2023-03-01 DIAGNOSIS — G894 Chronic pain syndrome: Secondary | ICD-10-CM | POA: Diagnosis not present

## 2023-03-01 MED ORDER — HYDROCODONE-ACETAMINOPHEN 5-325 MG PO TABS: 1.0000 | ORAL_TABLET | ORAL | 0 refills | Status: DC | PRN

## 2023-03-01 MED ORDER — PREDNISONE 20 MG PO TABS: 20.0000 mg | ORAL_TABLET | Freq: Every day | ORAL | 0 refills | Status: AC

## 2023-03-01 MED ORDER — GABAPENTIN 800 MG PO TABS: 800.0000 mg | ORAL_TABLET | Freq: Three times a day (TID) | ORAL | 5 refills | Status: DC

## 2023-03-01 NOTE — Progress Notes (Signed)
Subjective:    Patient ID: Kim Anthony, female    DOB: August 30, 1950, 72 y.o.   [name]: [id number]  HPI  Patient is a 72 yr old female with aortic stenosis s/p AVR on Coumadin,  HTN, hx of PE's on chronic coumadin , CHF, and COPD- here for f/u of R side low back  Pain with sciatica.  Last ECHO 11/23- EF 35-40%, severely reduced RV function- stable mech mitral valve and severe tricuspid regurg.    Broke R hip- 09/21/71- and then B/L knees bothering her- drawing fluid out of L knee a lot (last week- and got B/L knee injections)- got fluid removed on L since could barely walk and hurt R wrist-   R wrist swelling but no fracture- no trauma.  Doesn't know if it's gout.    Had been doubling pills-  If stands, to cook, wash dishes, back goes crazy.    Dr Jillyn Hidden- [facility]- "doubled her pain meds".  But no one called me to increase pain meds and haven't heard from anyone else. Kim Anthony to urgent care last Saturday- and was given Prednisone 20 mg daily- and went on steroids for 5-7 days 2 weeks ago for L knee issues.   Has 90 left pain meds.  Someone cut her gabapentin looks like from Cr up to 2.12- spiked- but back to 1.07 late July.   Has shingles. April as well-   Pain Inventory Average Pain 7 Pain Right Now 7 My pain is aching  In the last 24 hours, has pain interfered with the following? General activity 8 Relation with others 8 Enjoyment of life 8 What TIME of day is your pain at its worst? varies Sleep (in general) Fair  Pain is worse with: standing and some activites Pain improves with: medication Relief from Meds: 3  Family History  Problem Relation Age of Onset   Hypertension Mother    Cancer Father    Hypertension Father    Breast cancer Maternal Aunt 50   Heart attack Other    Social History   Socioeconomic History   Marital status: Single    Spouse name: Not on file   Number of children: Not on file   Years of education: Not on file   Highest education level:  Not on file  Occupational History   Not on file  Tobacco Use   Smoking status: Every Day    Current packs/day: 0.10    Average packs/day: 0.1 packs/day for 30.0 years (3.0 ttl pk-yrs)    Types: Cigarettes   Smokeless tobacco: Never   Tobacco comments:    5 cigs per day  Vaping Use   Vaping status: Never Used  Substance and Sexual Activity   Alcohol use: Yes    Alcohol/week: 3.0 standard drinks of alcohol    Types: 3 Standard drinks or equivalent per week    Comment: beer- ocasional   Drug use: No   Sexual activity: Not Currently    Birth control/protection: None  Other Topics Concern   Not on file  Social History Narrative   ** Merged History Encounter **       Social Determinants of Health   Financial Resource Strain: Low Risk  (12/01/2022)   Overall Financial Resource Strain (CARDIA)    Difficulty of Paying Living Expenses: Not very hard  Food Insecurity: No Food Insecurity (12/01/2022)   Hunger Vital Sign    Worried About Running Out of Food in the Last Year: Never  true    Ran Out of Food in the Last Year: Never true  Transportation Needs: No Transportation Needs (12/01/2022)   PRAPARE - Administrator, Civil Service (Medical): No    Lack of Transportation (Non-Medical): No  Physical Activity: Inactive (12/01/2022)   Exercise Vital Sign    Days of Exercise per Week: 0 days    Minutes of Exercise per Session: 10 min  Stress: Stress Concern Present (12/01/2022)   [employer] of Occupational Health - Occupational Stress Questionnaire    Feeling of Stress : To some extent  Social Connections: Socially Isolated (12/01/2022)   Social Connection and Isolation Panel [NHANES]    Frequency of Communication with Friends and Family: More than three times a week    Frequency of Social Gatherings with Friends and Family: More than three times a week    Attends Religious Services: Never    Database administrator or Organizations: No    Attends Banker  Meetings: Never    Marital Status: Never married   Past Surgical History:  Procedure Laterality Date   APPLICATION OF A-CELL OF CHEST/ABDOMEN Left 01/27/2019   Procedure: APPLICATION OF A-CELL OF CHEST;  Surgeon: Peggye Form, DO;  Location: [facility] OR;  Service: Plastics;  Laterality: Left;   APPLICATION OF WOUND VAC N/A 01/27/2019   Procedure: APPLICATION OF WOUND VAC;  Surgeon: Peggye Form, DO;  Location: [facility] OR;  Service: Plastics;  Laterality: N/A;   BREAST SURGERY Left 2001   for CA   CARDIOVERSION N/A 02/26/2022   Procedure: CARDIOVERSION;  Surgeon: Little Ishikawa, MD;  Location: [facility] ENDOSCOPY;  Service: Cardiovascular;  Laterality: N/A;   COLONOSCOPY WITH PROPOFOL N/A 06/07/2018   Procedure: COLONOSCOPY WITH PROPOFOL;  Surgeon: Charlott Rakes, MD;  Location: [facility] ENDOSCOPY;  Service: Endoscopy;  Laterality: N/A;   DEBRIDEMENT AND CLOSURE WOUND Left 12/28/2018   Procedure: Debridement And Closure Wound;  Surgeon: Abigail Miyamoto, MD;  Location: [facility] OR;  Service: General;  Laterality: Left;   INCISION AND DRAINAGE OF WOUND Left 01/27/2019   Procedure: IRRIGATION AND DEBRIDEMENT OF CHEST WALL;  Surgeon: Peggye Form, DO;  Location: [facility] OR;  Service: Plastics;  Laterality: Left;   INTRAMEDULLARY (IM) NAIL INTERTROCHANTERIC N/A 09/23/2022   Procedure: INTRAMEDULLARY (IM) NAIL INTERTROCHANTERIC;  Surgeon: Tarry Kos, MD;  Location: [facility] OR;  Service: Orthopedics;  Laterality: N/A;   KNEE ARTHROSCOPY WITH MEDIAL MENISECTOMY Left 08/06/2021   Procedure: KNEE ARTHROSCOPY WITH PARTIAL MEDIAL MENISECTOMY, DEBRIDEMENT;  Surgeon: Jene Every, MD;  Location: [facility] ORS;  Service: Orthopedics;  Laterality: Left;  1 HR   LATISSIMUS FLAP TO BREAST Left 01/18/2019   Procedure: LEFT LATISSIMUS FLAP TO BREAST;  Surgeon: Peggye Form, DO;  Location: [facility] OR;  Service: Plastics;  Laterality: Left;   MASTECTOMY Left    MITRAL VALVE REPLACEMENT     mechanical   POLYPECTOMY   06/07/2018   Procedure: POLYPECTOMY;  Surgeon: Charlott Rakes, MD;  Location: [facility] ENDOSCOPY;  Service: Endoscopy;;   RIGHT/LEFT HEART CATH AND CORONARY ANGIOGRAPHY N/A 01/09/2021   Procedure: RIGHT/LEFT HEART CATH AND CORONARY ANGIOGRAPHY;  Surgeon: Dolores Patty, MD;  Location: [facility] INVASIVE CV LAB;  Service: Cardiovascular;  Laterality: N/A;   WOUND DEBRIDEMENT Left 12/28/2018   Procedure: EXCISION OF LEFT CHRONIC CHEST WALL WOUND;  Surgeon: Abigail Miyamoto, MD;  Location: [facility] OR;  Service: General;  Laterality: Left;   Past Surgical History:  Procedure Laterality Date   APPLICATION OF A-CELL OF  CHEST/ABDOMEN Left 01/27/2019   Procedure: APPLICATION OF A-CELL OF CHEST;  Surgeon: Peggye Form, DO;  Location: [facility] OR;  Service: Plastics;  Laterality: Left;   APPLICATION OF WOUND VAC N/A 01/27/2019   Procedure: APPLICATION OF WOUND VAC;  Surgeon: Peggye Form, DO;  Location: [facility] OR;  Service: Plastics;  Laterality: N/A;   BREAST SURGERY Left 2001   for CA   CARDIOVERSION N/A 02/26/2022   Procedure: CARDIOVERSION;  Surgeon: Little Ishikawa, MD;  Location: [facility] ENDOSCOPY;  Service: Cardiovascular;  Laterality: N/A;   COLONOSCOPY WITH PROPOFOL N/A 06/07/2018   Procedure: COLONOSCOPY WITH PROPOFOL;  Surgeon: Charlott Rakes, MD;  Location: [facility] ENDOSCOPY;  Service: Endoscopy;  Laterality: N/A;   DEBRIDEMENT AND CLOSURE WOUND Left 12/28/2018   Procedure: Debridement And Closure Wound;  Surgeon: Abigail Miyamoto, MD;  Location: [facility] OR;  Service: General;  Laterality: Left;   INCISION AND DRAINAGE OF WOUND Left 01/27/2019   Procedure: IRRIGATION AND DEBRIDEMENT OF CHEST WALL;  Surgeon: Peggye Form, DO;  Location: [facility] OR;  Service: Plastics;  Laterality: Left;   INTRAMEDULLARY (IM) NAIL INTERTROCHANTERIC N/A 09/23/2022   Procedure: INTRAMEDULLARY (IM) NAIL INTERTROCHANTERIC;  Surgeon: Tarry Kos, MD;  Location: [facility] OR;  Service: Orthopedics;  Laterality: N/A;   KNEE  ARTHROSCOPY WITH MEDIAL MENISECTOMY Left 08/06/2021   Procedure: KNEE ARTHROSCOPY WITH PARTIAL MEDIAL MENISECTOMY, DEBRIDEMENT;  Surgeon: Jene Every, MD;  Location: [facility] ORS;  Service: Orthopedics;  Laterality: Left;  1 HR   LATISSIMUS FLAP TO BREAST Left 01/18/2019   Procedure: LEFT LATISSIMUS FLAP TO BREAST;  Surgeon: Peggye Form, DO;  Location: [facility] OR;  Service: Plastics;  Laterality: Left;   MASTECTOMY Left    MITRAL VALVE REPLACEMENT     mechanical   POLYPECTOMY  06/07/2018   Procedure: POLYPECTOMY;  Surgeon: Charlott Rakes, MD;  Location: [facility] ENDOSCOPY;  Service: Endoscopy;;   RIGHT/LEFT HEART CATH AND CORONARY ANGIOGRAPHY N/A 01/09/2021   Procedure: RIGHT/LEFT HEART CATH AND CORONARY ANGIOGRAPHY;  Surgeon: Dolores Patty, MD;  [facility]: [facility] INVAS[facility] CV LAB;  Service: Cardiovascular;  Laterality: N/A;   WOUND DEBRIDEMENT Left 12/28/2018   Procedure: EXCISION OF LEFT CHRONIC CHEST WALL WOUND;  Surgeon: Abigail Miyamoto, MD;  Location: [facility] OR;  Service: General;  Laterality: Left;   Past Medical History:  Diagnosis Date   Arthritis    Asthma    Breast cancer (HCC) 2001   Left Breast Cancer   CHF (congestive heart failure) (HCC)    COPD (chronic obstructive pulmonary disease) (HCC)    Coronary artery disease    GERD (gastroesophageal reflux disease)    History of mitral valve replacement with mechanical valve    HLD (hyperlipidemia)    Hypertension    Pulmonary embolism (HCC) 2021   BP 131/80   Pulse 77   Ht 5\' 5"  (1.651 m)   Wt 153 lb (69.4 kg)   SpO2 96%   BMI 25.46 kg/m   Opioid Risk Score:   Fall Risk Score:  `1  Depression screen [facility] 2/9     12/01/2022   11:27 AM 12/01/2022   10:54 AM 07/02/2022   11:30 AM 06/29/2022   10:31 AM 03/16/2022   10:57 AM 01/13/2022   11:32 AM 01/09/2022    9:02 AM  Depression screen PHQ 2/9  Decreased Interest 0 0 1 0 0 0 0  Down, Depressed, Hopeless 0 0 0 0 0 0 0  PHQ - 2 Score 0 0 1 0 0 0 0  Altered sleeping   0       Tired, decreased energy   0      Change in appetite   0      Feeling bad or failure about yourself    0      Trouble concentrating   0      Moving slowly or fidgety/restless   0      Suicidal thoughts   0      PHQ-9 Score   1      Difficult doing work/chores   Not difficult at all          Review of Systems  Constitutional:  Negative for appetite change.  Musculoskeletal:  Positive for back pain.       Pain in right hip, wrist and bilateral knee   All other systems reviewed and are negative.     Objective:   Physical Exam  R hand swelling running into  L medial aspect and fingers very swollen- looks a little erythematous-  Looks like gout No increased heat.   L knee swelling/effusion L knee- mild to moderate effusion.  Walking with cane to get around and R wrist splint.     Assessment & Plan:   Patient is a 72 yr old female with aortic stenosis s/p AVR on Coumadin,  HTN, hx of PE's on chronic coumadin , CHF, and COPD- here for f/u of R side low back  Pain with sciatica.  Last ECHO 11/23- EF 35-40%, severely reduced RV function- stable mech mitral valve and severe tricuspid regurg. R wrist gout- acute-    Dr Jillyn Hidden hasn't called- so explained to pt cannot write for increased pain meds until I hear from [facility] OR or can print out note and have it faxed- there are may ways to let me know- OR they can take over the pain meds themselves until the immediate pain issues are over.   2.  Will increase gabapentin to regular dose 800 mg 3x/day-  #90 with 5 refills.   3. Will con't Norco #150- for now. But needs a note from Dr Jillyn Hidden to continue.  Sent in- so next refill can be done on time.   4.  Will give 3 more days of Prednisone 20 mg daily- for gout attack in R wrist   5. F/U in 3 months- continue regimen    I spent a total of 33   minutes on total care today- >50% coordination of care- due to treating gout, chronic pain; nerve pain as well as d/w pt about pain meds.

## 2023-03-01 NOTE — Patient Instructions (Signed)
Patient is a 72 yr old female with aortic stenosis s/p AVR on Coumadin,  HTN, hx of PE's on chronic coumadin , CHF, and COPD- here for f/u of R side low back  Pain with sciatica.  Last ECHO 11/23- EF 35-40%, severely reduced RV function- stable mech mitral valve and severe tricuspid regurg. R wrist gout- acute-    Dr Jillyn Hidden hasn't called- so explained to pt cannot write for increased pain meds until I hear from [facility] OR or can print out note and have it faxed- there are may ways to let me know- OR they can take over the pain meds themselves until the immediate pain issues are over.   2.  Will increase gabapentin to regular dose 800 mg 3x/day-  #90 with 5 refills.   3. Will con't Norco #150- for now. But needs a note from Dr Jillyn Hidden to continue.  Sent in- so next refill can be done on time.   4.  Will give 3 more days of Prednisone 20 mg daily- for gout attack in R wrist   5. F/U in 3months- continue regimen

## 2023-03-08 ENCOUNTER — Encounter: Payer: 59 | Admitting: Student

## 2023-03-08 ENCOUNTER — Other Ambulatory Visit: Payer: Self-pay

## 2023-03-08 DIAGNOSIS — I5042 Chronic combined systolic (congestive) and diastolic (congestive) heart failure: Secondary | ICD-10-CM

## 2023-03-10 DIAGNOSIS — M79641 Pain in right hand: Secondary | ICD-10-CM | POA: Diagnosis not present

## 2023-03-10 DIAGNOSIS — M25532 Pain in left wrist: Secondary | ICD-10-CM | POA: Diagnosis not present

## 2023-03-10 DIAGNOSIS — M25531 Pain in right wrist: Secondary | ICD-10-CM | POA: Diagnosis not present

## 2023-03-10 MED ORDER — DAPAGLIFLOZIN PROPANEDIOL 10 MG PO TABS
10.0000 mg | ORAL_TABLET | Freq: Every day | ORAL | 0 refills | Status: DC
Start: 2023-03-10 — End: 2023-06-03

## 2023-03-24 ENCOUNTER — Other Ambulatory Visit: Payer: Self-pay | Admitting: Student

## 2023-03-24 DIAGNOSIS — J449 Chronic obstructive pulmonary disease, unspecified: Secondary | ICD-10-CM

## 2023-03-24 DIAGNOSIS — J441 Chronic obstructive pulmonary disease with (acute) exacerbation: Secondary | ICD-10-CM

## 2023-03-24 MED ORDER — TRELEGY ELLIPTA 100-62.5-25 MCG/ACT IN AEPB
1.0000 | INHALATION_SPRAY | Freq: Every day | RESPIRATORY_TRACT | 0 refills | Status: DC
Start: 2023-03-24 — End: 2023-04-13

## 2023-03-24 MED ORDER — WARFARIN SODIUM 5 MG PO TABS: ORAL_TABLET | ORAL | 2 refills | Status: DC

## 2023-03-25 ENCOUNTER — Ambulatory Visit: Payer: 59 | Admitting: Pharmacist

## 2023-03-25 DIAGNOSIS — Z7901 Long term (current) use of anticoagulants: Secondary | ICD-10-CM | POA: Diagnosis not present

## 2023-03-25 DIAGNOSIS — Z952 Presence of prosthetic heart valve: Secondary | ICD-10-CM

## 2023-03-25 DIAGNOSIS — I2699 Other pulmonary embolism without acute cor pulmonale: Secondary | ICD-10-CM

## 2023-03-25 LAB — POCT INR: INR: 1.1 — AB (ref 2.0–3.0)

## 2023-03-25 NOTE — Patient Instructions (Signed)
Patient instructed to take medications as defined in the Anti-coagulation Track section of this encounter.  Patient instructed to take today's dose.  Patient instructed to take one (1) of your 5 mg peach-colored warfarin tablets by mouth, once-daily at 4:00 PM.  Patient verbalized understanding of these instructions.

## 2023-03-25 NOTE — Progress Notes (Signed)
Anticoagulation Management Kim Anthony is a 72 y.o. female who reports to the clinic for monitoring of warfarin treatment.    Indication:  History of mitral valve repair requiring INR target range of 2.5 - 3.5. Remote history of PE.    Duration: indefinite Supervising physician:  Reymundo Poll, MD  Anticoagulation Clinic Visit History: Patient does not report signs/symptoms of bleeding or thromboembolism  Other recent changes: No diet, medications, lifestyle changes.  Anticoagulation Episode Summary     Current INR goal:  2.5-3.5  TTR:  29.5% (7 y)  Next INR check:  04/05/2023  INR from last check:  1.1 (03/25/2023)  Weekly max warfarin dose:  --  Target end date:  --  INR check location:  Anticoagulation Clinic  Preferred lab:  --  Send INR reminders to:  --   Indications   Pulmonary embolism (HCC) [I26.99]        Comments:  --         Allergies  Allergen Reactions   Lisinopril Swelling   Acetaminophen-Codeine Itching   Propoxyphene Itching    Darvocet   Tape Rash    Plastic   Tramadol Itching, Nausea And Vomiting and Rash    Current Outpatient Medications:    albuterol (VENTOLIN HFA) 108 (90 Base) MCG/ACT inhaler, INHALE 2 PUFFS BY MOUTH EVERY 6 HOURS AS NEEDED FOR WHEEZING FOR SHORTNESS OF BREATH, Disp: 9 g, Rfl: 0   dapagliflozin propanediol (FARXIGA) 10 MG TABS tablet, Take 1 tablet (10 mg total) by mouth daily., Disp: 90 tablet, Rfl: 0   DULoxetine (CYMBALTA) 20 MG capsule, Take 20 mg by mouth at bedtime. As needed, Disp: , Rfl:    fluticasone (FLONASE) 50 MCG/ACT nasal spray, Place 2 sprays into both nostrils daily., Disp: 11.1 mL, Rfl: 3   Fluticasone-Umeclidin-Vilant (TRELEGY ELLIPTA) 100-62.5-25 MCG/ACT AEPB, Inhale 1 puff into the lungs daily., Disp: 60 each, Rfl: 0   gabapentin (NEURONTIN) 300 MG capsule, Take 1 capsule (300 mg total) by mouth 3 (three) times daily., Disp: 90 capsule, Rfl: 0   gabapentin (NEURONTIN) 800 MG tablet, Take 1 tablet (800  mg total) by mouth 3 (three) times daily., Disp: 90 tablet, Rfl: 5   HYDROcodone-acetaminophen (NORCO/VICODIN) 5-325 MG tablet, Take 1 tablet by mouth every 4 (four) hours as needed for moderate pain., Disp: 150 tablet, Rfl: 0   losartan (COZAAR) 25 MG tablet, Take 1/2 (one-half) tablet by mouth once daily, Disp: 45 tablet, Rfl: 6   metoprolol succinate (TOPROL-XL) 25 MG 24 hr tablet, Take 1 tablet (25 mg total) by mouth daily., Disp: 90 tablet, Rfl: 2   pantoprazole (PROTONIX) 40 MG tablet, Take 1 tablet (40 mg total) by mouth daily., Disp: 90 tablet, Rfl: 0   potassium chloride (KLOR-CON M) 10 MEQ tablet, Take 4 tablets (40 mEq total) by mouth daily., Disp: 120 tablet, Rfl: 6   simvastatin (ZOCOR) 20 MG tablet, Take 1 tablet (20 mg total) by mouth daily., Disp: 90 tablet, Rfl: 2   torsemide (DEMADEX) 20 MG tablet, Take 3 tablets (60 mg total) by mouth daily., Disp: 90 tablet, Rfl: 6   warfarin (COUMADIN) 5 MG tablet, Take one-half (1/2) of your 5mg  peach-colored warfarin tablet on Mondays, Wednesdays and Fridays. All OTHER days (Sundays, Tuesdays, Thursdays and Saturdays)--take one (1) tablet., Disp: 20 tablet, Rfl: 2 Past Medical History:  Diagnosis Date   Arthritis    Asthma    Breast cancer (HCC) 2001   Left Breast Cancer   CHF (congestive heart failure) (HCC)  COPD (chronic obstructive pulmonary disease) (HCC)    Coronary artery disease    GERD (gastroesophageal reflux disease)    History of mitral valve replacement with mechanical valve    HLD (hyperlipidemia)    Hypertension    Pulmonary embolism (HCC) 2021   Social History   Socioeconomic History   Marital status: Single    Spouse name: Not on file   Number of children: Not on file   Years of education: Not on file   Highest education level: Not on file  Occupational History   Not on file  Tobacco Use   Smoking status: Every Day    Current packs/day: 0.10    Average packs/day: 0.1 packs/day for 30.0 years (3.0 ttl  pk-yrs)    Types: Cigarettes   Smokeless tobacco: Never   Tobacco comments:    5 cigs per day  Vaping Use   Vaping status: Never Used  Substance and Sexual Activity   Alcohol use: Yes    Alcohol/week: 3.0 standard drinks of alcohol    Types: 3 Standard drinks or equivalent per week    Comment: beer- ocasional   Drug use: No   Sexual activity: Not Currently    Birth control/protection: None  Other Topics Concern   Not on file  Social History Narrative   ** Merged History Encounter **       Social Determinants of Health   Financial Resource Strain: Low Risk  (12/01/2022)   Overall Financial Resource Strain (CARDIA)    Difficulty of Paying Living Expenses: Not very hard  Food Insecurity: No Food Insecurity (12/01/2022)   Hunger Vital Sign    Worried About Running Out of Food in the Last Year: Never true    Ran Out of Food in the Last Year: Never true  Transportation Needs: No Transportation Needs (12/01/2022)   PRAPARE - Administrator, Civil Service (Medical): No    Lack of Transportation (Non-Medical): No  Physical Activity: Inactive (12/01/2022)   Exercise Vital Sign    Days of Exercise per Week: 0 days    Minutes of Exercise per Session: 10 min  Stress: Stress Concern Present (12/01/2022)   [employer] of Occupational Health - Occupational Stress Questionnaire    Feeling of Stress : To some extent  Social Connections: Socially Isolated (12/01/2022)   Social Connection and Isolation Panel [NHANES]    Frequency of Communication with Friends and Family: More than three times a week    Frequency of Social Gatherings with Friends and Family: More than three times a week    Attends Religious Services: Never    Database administrator or Organizations: No    Attends Engineer, structural: Never    Marital Status: Never married   Family History  Problem Relation Age of Onset   Hypertension Mother    Cancer Father    Hypertension Father    Breast  cancer Maternal Aunt 50   Heart attack Other     ASSESSMENT Recent Results: The most recent result is correlated with 30 mg per week: Lab Results  Component Value Date   INR 1.1 (A) 03/25/2023   INR 2.6 (H) 11/18/2022   INR 3.3 (H) 11/17/2022    Anticoagulation Dosing: Description   Take one (1) of your 5 mg peach-colored warfarin tablets by mouth, once-daily at 4:00 PM.      INR today: Therapeutic  PLAN Weekly dose was increased by 15% to 35 mg per week  Patient  Instructions  Patient instructed to take medications as defined in the Anti-coagulation Track section of this encounter.  Patient instructed to take today's dose.  Patient instructed to take one (1) of your 5 mg peach-colored warfarin tablets by mouth, once-daily at 4:00 PM.  Patient verbalized understanding of these instructions.  Patient advised to contact clinic or seek medical attention if signs/symptoms of bleeding or thromboembolism occur.  Patient verbalized understanding by repeating back information and was advised to contact me if further medication-related questions arise. Patient was also provided an information handout.  Follow-up Return in 11 days (on 04/05/2023) for Follow up INR. A fingerstick, point of care INR by PATIENT SELF TESTING will be performed in the HOME SETTING by patient or her daughter, [name] in 11 days.  Elicia Lamp, PharmD, CPP  15 minutes spent face-to-face with the patient during the encounter. 50% of time spent on education, including signs/sx bleeding and clotting, as well as food and drug interactions with warfarin. 50% of time was spent on fingerprick POC INR sample collection,processing, results determination, and documentation in [email or web address].

## 2023-04-01 DIAGNOSIS — M170 Bilateral primary osteoarthritis of knee: Secondary | ICD-10-CM | POA: Diagnosis not present

## 2023-04-06 ENCOUNTER — Telehealth: Payer: Self-pay | Admitting: Pharmacist

## 2023-04-06 NOTE — Telephone Encounter (Signed)
Patient's daughter provided me results of PST FS POC INR = 1.2 on 35 mg warfarin/wk (5 mg qd). No missed doses. Will increase to 42.5 mg/wk (18% increase per protocol, given this INR). 1&1/2 x5mg  (7.5mg ) on TuThSa; 5mg  all other days. Repeat INR 28OCT24. Patient IS on prednisone her daughter states--which can increase INR responsiveness.Advised daughter to carefully monitor for signs/symptoms suggestive of INR increase (rapid), I.e. blood in stools, nose bleeds, throwing up/coughing up blood, increased bruising, etc.

## 2023-04-12 ENCOUNTER — Telehealth: Payer: Self-pay | Admitting: Specialist

## 2023-04-12 MED ORDER — HYDROCODONE-ACETAMINOPHEN 5-325 MG PO TABS: 1.0000 | ORAL_TABLET | ORAL | 0 refills | Status: DC | PRN

## 2023-04-12 NOTE — Telephone Encounter (Signed)
Patient needs refill of hydrocodone called in to [facility] on [address] Shirlean Mylar, MHA, OT/L [phone]

## 2023-04-13 ENCOUNTER — Other Ambulatory Visit: Payer: Self-pay | Admitting: Internal Medicine

## 2023-04-13 DIAGNOSIS — J441 Chronic obstructive pulmonary disease with (acute) exacerbation: Secondary | ICD-10-CM

## 2023-04-16 ENCOUNTER — Ambulatory Visit (INDEPENDENT_AMBULATORY_CARE_PROVIDER_SITE_OTHER): Payer: 59 | Admitting: Student

## 2023-04-16 ENCOUNTER — Other Ambulatory Visit: Payer: Self-pay

## 2023-04-16 ENCOUNTER — Encounter: Payer: Self-pay | Admitting: Student

## 2023-04-16 VITALS — BP 111/63 | HR 82 | Temp 98.4°F | Ht 65.0 in | Wt 168.2 lb

## 2023-04-16 DIAGNOSIS — H9193 Unspecified hearing loss, bilateral: Secondary | ICD-10-CM | POA: Diagnosis not present

## 2023-04-16 DIAGNOSIS — J449 Chronic obstructive pulmonary disease, unspecified: Secondary | ICD-10-CM

## 2023-04-16 DIAGNOSIS — I5022 Chronic systolic (congestive) heart failure: Secondary | ICD-10-CM

## 2023-04-16 DIAGNOSIS — J441 Chronic obstructive pulmonary disease with (acute) exacerbation: Secondary | ICD-10-CM

## 2023-04-16 DIAGNOSIS — E876 Hypokalemia: Secondary | ICD-10-CM | POA: Diagnosis not present

## 2023-04-16 MED ORDER — PANTOPRAZOLE SODIUM 40 MG PO TBEC: 40.0000 mg | DELAYED_RELEASE_TABLET | Freq: Every day | ORAL | 0 refills | Status: DC

## 2023-04-16 MED ORDER — TRELEGY ELLIPTA 100-62.5-25 MCG/ACT IN AEPB
1.0000 | INHALATION_SPRAY | Freq: Every day | RESPIRATORY_TRACT | 0 refills | Status: DC
Start: 2023-04-16 — End: 2023-06-14

## 2023-04-16 MED ORDER — ALBUTEROL SULFATE HFA 108 (90 BASE) MCG/ACT IN AERS
2.0000 | INHALATION_SPRAY | Freq: Four times a day (QID) | RESPIRATORY_TRACT | 0 refills | Status: DC | PRN
Start: 2023-04-16 — End: 2023-05-10

## 2023-04-16 NOTE — Patient Instructions (Addendum)
Thank you, Ms.[name] for allowing [place] to provide your care today. Today we discussed her COPD, tobacco use, heart failure, hearing loss, and hypokalemia.  I have ordered the following labs for you:  Lab Orders         BMP8+Anion Gap      Tests ordered today:  - none  Referrals ordered today:   Referral Orders  No referral(s) requested today     I have ordered the following medication/changed the following medications:   Stop the following medications: Medications Discontinued During This Encounter  Medication Reason   pantoprazole (PROTONIX) 40 MG tablet Reorder   gabapentin (NEURONTIN) 300 MG capsule Patient has not taken in last 30 days     Start the following medications: Meds ordered this encounter  Medications   pantoprazole (PROTONIX) 40 MG tablet    Sig: Take 1 tablet (40 mg total) by mouth daily.    Dispense:  90 tablet    Refill:  0     Follow up: 2-3 months for smoking cessation    Remember:   - You got refills for your two inhalers and your pantoprazole.   - I will call you with the results of your basic metabolic panel blood test from today.   - You will come back to see [place] in 2-3 months to discuss smoking cessation.   - Please continue to take your other medications as instructed, if you have any questions or concerns about them please give [place] a call.   Should you have any questions or concerns please call the internal medicine clinic at [phone].     Minh Roanhorse Colbert Coyer, MD PGY-1 Internal Medicine Teaching [facility]

## 2023-04-18 LAB — BMP8+ANION GAP
Anion Gap: 16 mmol/L (ref 10.0–18.0)
BUN/Creatinine Ratio: 19 (ref 12–28)
BUN: 20 mg/dL (ref 8–27)
CO2: 19 mmol/L — ABNORMAL LOW (ref 20–29)
Calcium: 9.3 mg/dL (ref 8.7–10.3)
Chloride: 99 mmol/L (ref 96–106)
Creatinine, Ser: 1.06 mg/dL — ABNORMAL HIGH (ref 0.57–1.00)
Glucose: 96 mg/dL (ref 70–99)
Potassium: 3.6 mmol/L (ref 3.5–5.2)
Sodium: 134 mmol/L (ref 134–144)
eGFR: 55 mL/min/{1.73_m2} — ABNORMAL LOW (ref >59)

## 2023-04-18 NOTE — Progress Notes (Signed)
Established Patient Office Visit  Subjective   Patient ID: Kim Anthony, female    DOB: 1950/11/27  Age: 72 y.o. [name]: [id number]  Chief Complaint  Patient presents with   Follow-up    Patient is a 72 yo with a past medical history stated below who presents today for follow-up for COPD, bilateral hearing loss, chronic HFrEF and hypokalemia. Last [facility] visit 12/01/2022.   Patient follows with pain management clinic for norco/vicodin prescription. Seeing Dr. Frazier Butt, a hand specialist for hand and wrist pain. Would like a refill for her pantoprazole 40 mg daily today.   Please see problem based assessment and plan for additional details.     Past Medical History:  Diagnosis Date   Arthritis    Asthma    Breast cancer (HCC) 2001   Left Breast Cancer   CHF (congestive heart failure) (HCC)    COPD (chronic obstructive pulmonary disease) (HCC)    Coronary artery disease    GERD (gastroesophageal reflux disease)    History of mitral valve replacement with mechanical valve    HLD (hyperlipidemia)    Hypertension    Pulmonary embolism (HCC) 2021      Review of Systems  Respiratory:  Positive for cough. Negative for shortness of breath.   Musculoskeletal:  Positive for joint pain.     Objective:     BP 111/63 (BP Location: Right Arm, Patient Position: Sitting, Cuff Size: Normal)   Pulse 82   Temp 98.4 F (36.9 C) (Oral)   Ht 5\' 5"  (1.651 m)   Wt 168 lb 3.2 oz (76.3 kg)   SpO2 97%   BMI 27.99 kg/m  BP Readings from Last 3 Encounters:  04/16/23 111/63  03/01/23 131/80  02/27/23 103/68   Wt Readings from Last 3 Encounters:  04/16/23 168 lb 3.2 oz (76.3 kg)  03/01/23 153 lb (69.4 kg)  01/19/23 153 lb (69.4 kg)      Physical Exam HENT:     Head: Normocephalic and atraumatic.     Mouth/Throat:     Mouth: Mucous membranes are moist.  Cardiovascular:     Rate and Rhythm: Normal rate and regular rhythm.     Pulses: Normal pulses.     Heart sounds: Normal heart  sounds.  Pulmonary:     Effort: Pulmonary effort is normal.     Breath sounds: Normal breath sounds.  Abdominal:     General: Bowel sounds are normal.     Palpations: Abdomen is soft.  Musculoskeletal:     Comments: Patient with bilateral inflammation on wrist joints, left hand in brace  Neurological:     General: No focal deficit present.     Mental Status: She is alert.  Psychiatric:        Mood and Affect: Mood normal.        Behavior: Behavior normal.    Results for orders placed or performed in visit on 04/16/23  BMP8+Anion Gap  Result Value Ref Range   Glucose 96 70 - 99 mg/dL   BUN 20 8 - 27 mg/dL   Creatinine, Ser 8.41 (H) 0.57 - 1.00 mg/dL   eGFR 55 (L) >32 GM/WNU/2.72   BUN/Creatinine Ratio 19 12 - 28   Sodium 134 134 - 144 mmol/L   Potassium 3.6 3.5 - 5.2 mmol/L   Chloride 99 96 - 106 mmol/L   CO2 19 (L) 20 - 29 mmol/L   Anion Gap 16.0 10.0 - 18.0 mmol/L   Calcium 9.3 8.7 -  10.3 mg/dL    The ASCVD Risk score (Arnett DK, et al., 2019) failed to calculate for the following reasons:   The valid total cholesterol range is 130 to 320 mg/dL    Assessment & Plan:   Problem List Items Addressed This Visit       Cardiovascular and Mediastinum   Chronic HFrEF (heart failure with reduced ejection fraction) (HCC) - Primary    Patient is taking farxiga 10 mg daily, losartan 25 mg daily, metoprolol 25 mg daily, and torsemide 60 mg daily. Tolerating medications well, takes consistently. Denies any weight gain, CP, or SOB. No swelling on physical exam today.  - Continue farxiga 10 mg daily, losartan 25 mg daily, metoprolol 25 mg daily, and torsemide 60 mg daily      Relevant Orders   BMP8+Anion Gap (Completed)     Respiratory   COPD exacerbation (HCC)    Patient is using trelegy ellipta and albuterol inhalers. Has a nebulizing machine at home that helps with breathing treatments. Denies any SOB, endorses coughing every now and then. Still smoking 3-4 cigarettes a day.  Has been given chantix in the past but has no current quit date in mind. Discussed smoking as a trigger for COPD exacerbations. Will consider a quit date after the holidays.  - Continue trelegy ellipta and albuterol inhalers - 2-3 month follow up for smoking cessation counseling        Nervous and Auditory   Bilateral hearing loss    Patient previously referred to audiologist. [name] to the appointment, she was told her left ear hearing is worse than the right side. Has been trying to get bilateral hearing aids but not covered by insurance. Patient's daughter is helping patient to find affordable options. Will monitor.         Other   Hypokalemia    Patient with history of hypokalemia. Patient on diuretic therapy for HF. Takes potassium chloride 10 mEq tablet daily. Will get BMP today.  - Continue daily potassium chloride 10 mEq tablet daily       Return 2-3 months, for Smoking cessation.  Patient seen with Dr. Sol Blazing.   [name], MD

## 2023-04-18 NOTE — Assessment & Plan Note (Signed)
Patient is taking farxiga 10 mg daily, losartan 25 mg daily, metoprolol 25 mg daily, and torsemide 60 mg daily. Tolerating medications well, takes consistently. Denies any weight gain, CP, or SOB. No swelling on physical exam today.  - Continue farxiga 10 mg daily, losartan 25 mg daily, metoprolol 25 mg daily, and torsemide 60 mg daily

## 2023-04-18 NOTE — Assessment & Plan Note (Signed)
Patient is using trelegy ellipta and albuterol inhalers. Has a nebulizing machine at home that helps with breathing treatments. Denies any SOB, endorses coughing every now and then. Still smoking 3-4 cigarettes a day. Has been given chantix in the past but has no current quit date in mind. Discussed smoking as a trigger for COPD exacerbations. Will consider a quit date after the holidays.  - Continue trelegy ellipta and albuterol inhalers - 2-3 month follow up for smoking cessation counseling

## 2023-04-18 NOTE — Assessment & Plan Note (Signed)
Patient previously referred to audiologist. Kim Anthony to the appointment, she was told her left ear hearing is worse than the right side. Has been trying to get bilateral hearing aids but not covered by insurance. Patient's daughter is helping patient to find affordable options. Will monitor.

## 2023-04-18 NOTE — Assessment & Plan Note (Signed)
>>  ASSESSMENT AND PLAN FOR HFREF (HEART FAILURE WITH REDUCED EJECTION FRACTION) (HCC) WRITTEN ON 09/14/2023 11:44 AM BY Alesi Zachery, DO   >>ASSESSMENT AND PLAN FOR CHRONIC HFREF (HEART FAILURE WITH REDUCED EJECTION FRACTION) (HCC) WRITTEN ON 04/18/2023  4:25 PM BY Colbert Coyer, PRISCILA, MD  Patient is taking farxiga 10 mg daily, losartan 25 mg daily, metoprolol 25 mg daily, and torsemide 60 mg daily. Tolerating medications well, takes consistently. Denies any weight gain, CP, or SOB. No swelling on physical exam today.  - Continue farxiga 10 mg daily, losartan 25 mg daily, metoprolol 25 mg daily, and torsemide 60 mg daily

## 2023-04-18 NOTE — Assessment & Plan Note (Signed)
Patient with history of hypokalemia. Patient on diuretic therapy for HF. Takes potassium chloride 10 mEq tablet daily. Will get BMP today.  - Continue daily potassium chloride 10 mEq tablet daily

## 2023-04-18 NOTE — Assessment & Plan Note (Signed)
>>  ASSESSMENT AND PLAN FOR CHRONIC HFREF (HEART FAILURE WITH REDUCED EJECTION FRACTION) (HCC) WRITTEN ON 04/18/2023  4:25 PM BY Colbert Coyer, PRISCILA, MD  Patient is taking farxiga 10 mg daily, losartan 25 mg daily, metoprolol 25 mg daily, and torsemide 60 mg daily. Tolerating medications well, takes consistently. Denies any weight gain, CP, or SOB. No swelling on physical exam today.  - Continue farxiga 10 mg daily, losartan 25 mg daily, metoprolol 25 mg daily, and torsemide 60 mg daily

## 2023-04-20 NOTE — Telephone Encounter (Signed)
Texted by patient's daughter at 110h today reporting PST FS POC INR = 7.3 (Target Range 2.5 - 3.5) was supposed to have been on 42.5 mg warfarin/wk--but she has been taking 52.5 mg warfarin/wk. OMIT/HOLD x 2 days, Repeat INR 31OCT24 and report value to me before resuming warfarin. Called patient and advised above instructions. She reports NO bleeding, no new medications. States she has had some weight loss and is not eating as well as she normally does. Will evaluate her INR on  31OCT24 and dose accordingly.

## 2023-04-23 NOTE — Progress Notes (Signed)
Internal Medicine Clinic Attending  I was physically present during the key portions of the resident provided service and participated in the medical decision making of patient's management care. I reviewed pertinent patient test results.  The assessment, diagnosis, and plan were formulated together and I agree with the documentation in the resident's note.  Lau, Grace, MD  

## 2023-04-23 NOTE — Addendum Note (Signed)
Addended by: Dickie La on: 04/23/2023 03:24 PM   Modules accepted: Level of Service

## 2023-04-26 ENCOUNTER — Ambulatory Visit: Payer: 59 | Admitting: Pharmacist

## 2023-04-26 DIAGNOSIS — I2699 Other pulmonary embolism without acute cor pulmonale: Secondary | ICD-10-CM

## 2023-04-26 DIAGNOSIS — Z7901 Long term (current) use of anticoagulants: Secondary | ICD-10-CM | POA: Diagnosis not present

## 2023-04-26 DIAGNOSIS — Z952 Presence of prosthetic heart valve: Secondary | ICD-10-CM

## 2023-04-26 DIAGNOSIS — M170 Bilateral primary osteoarthritis of knee: Secondary | ICD-10-CM | POA: Diagnosis not present

## 2023-04-26 LAB — POCT INR: INR: 1.4 — AB (ref 2.0–3.0)

## 2023-04-26 MED ORDER — WARFARIN SODIUM 5 MG PO TABS: ORAL_TABLET | ORAL | 2 refills | Status: DC

## 2023-04-26 NOTE — Patient Instructions (Signed)
Patient instructed to take medications as defined in the Anti-coagulation Track section of this encounter.  Patient instructed to take today's dose.  Patient instructed to take one-and-one-half (1 & 1/2) of your 5mg  peach-colored warfarin tablets on Mondays and Thursdays. All other days, take only one (1) tablet.  Patient verbalized understanding of these instructions.

## 2023-04-26 NOTE — Progress Notes (Signed)
Anticoagulation Management Kim Anthony is a 72 y.o. female who reports to the clinic for monitoring of warfarin treatment.    Indication:  History of mitral valve replacement surgery; current long term use of oral anticoagulation with warfarin with target INR 2.5 - 3.5.   Duration: indefinite Supervising physician: Debe Coder  Anticoagulation Clinic Visit History: Patient does not report signs/symptoms of bleeding or thromboembolism  Other recent changes: No diet, medications, lifestyle changes except as noted in patient findings, I.e. has missed about 5-6 days of warfarin she states.  Anticoagulation Episode Summary     Current INR goal:  2.5-3.5  TTR:  29.2% (7.1 y)  Next INR check:  05/17/2023  INR from last check:  1.4 (04/26/2023)  Weekly max warfarin dose:  --  Target end date:  --  INR check location:  Anticoagulation Clinic  Preferred lab:  --  Send INR reminders to:  --   Indications   Pulmonary embolism (HCC) [I26.99]        Comments:  --         Allergies  Allergen Reactions   Lisinopril Swelling   Acetaminophen-Codeine Itching   Propoxyphene Itching    Darvocet   Tape Rash    Plastic   Tramadol Itching, Nausea And Vomiting and Rash    Current Outpatient Medications:    albuterol (VENTOLIN HFA) 108 (90 Base) MCG/ACT inhaler, Inhale 2 puffs into the lungs every 6 (six) hours as needed for wheezing or shortness of breath., Disp: 9 g, Rfl: 0   dapagliflozin propanediol (FARXIGA) 10 MG TABS tablet, Take 1 tablet (10 mg total) by mouth daily., Disp: 90 tablet, Rfl: 0   DULoxetine (CYMBALTA) 20 MG capsule, Take 20 mg by mouth at bedtime. As needed, Disp: , Rfl:    fluticasone (FLONASE) 50 MCG/ACT nasal spray, Place 2 sprays into both nostrils daily., Disp: 11.1 mL, Rfl: 3   Fluticasone-Umeclidin-Vilant (TRELEGY ELLIPTA) 100-62.5-25 MCG/ACT AEPB, Inhale 1 puff into the lungs daily., Disp: 60 each, Rfl: 0   gabapentin (NEURONTIN) 800 MG tablet, Take 1 tablet  (800 mg total) by mouth 3 (three) times daily., Disp: 90 tablet, Rfl: 5   HYDROcodone-acetaminophen (NORCO/VICODIN) 5-325 MG tablet, Take 1 tablet by mouth every 4 (four) hours as needed for moderate pain (pain score 4-6)., Disp: 150 tablet, Rfl: 0   losartan (COZAAR) 25 MG tablet, Take 1/2 (one-half) tablet by mouth once daily, Disp: 45 tablet, Rfl: 6   metoprolol succinate (TOPROL-XL) 25 MG 24 hr tablet, Take 1 tablet (25 mg total) by mouth daily., Disp: 90 tablet, Rfl: 2   pantoprazole (PROTONIX) 40 MG tablet, Take 1 tablet (40 mg total) by mouth daily., Disp: 90 tablet, Rfl: 0   potassium chloride (KLOR-CON M) 10 MEQ tablet, Take 4 tablets (40 mEq total) by mouth daily., Disp: 120 tablet, Rfl: 6   simvastatin (ZOCOR) 20 MG tablet, Take 1 tablet (20 mg total) by mouth daily., Disp: 90 tablet, Rfl: 2   torsemide (DEMADEX) 20 MG tablet, Take 3 tablets (60 mg total) by mouth daily., Disp: 90 tablet, Rfl: 6   warfarin (COUMADIN) 5 MG tablet, Take one-and-one-half (1 & 1/2) of your 5 mg peach colored warfarin tablets on Mondays and Thursdays. All other days, take only one (1) tablet., Disp: 32 tablet, Rfl: 2 Past Medical History:  Diagnosis Date   Arthritis    Asthma    Breast cancer (HCC) 2001   Left Breast Cancer   CHF (congestive heart failure) (HCC)  COPD (chronic obstructive pulmonary disease) (HCC)    Coronary artery disease    GERD (gastroesophageal reflux disease)    History of mitral valve replacement with mechanical valve    HLD (hyperlipidemia)    Hypertension    Pulmonary embolism (HCC) 2021   Social History   Socioeconomic History   Marital status: Single    Spouse name: Not on file   Number of children: Not on file   Years of education: Not on file   Highest education level: Not on file  Occupational History   Not on file  Tobacco Use   Smoking status: Every Day    Current packs/day: 0.10    Average packs/day: 0.1 packs/day for 30.0 years (3.0 ttl pk-yrs)    Types:  Cigarettes   Smokeless tobacco: Never   Tobacco comments:    5 cigs per day  Vaping Use   Vaping status: Never Used  Substance and Sexual Activity   Alcohol use: Yes    Alcohol/week: 3.0 standard drinks of alcohol    Types: 3 Standard drinks or equivalent per week    Comment: beer- ocasional   Drug use: No   Sexual activity: Not Currently    Birth control/protection: None  Other Topics Concern   Not on file  Social History Narrative   ** Merged History Encounter **       Social Determinants of Health   Financial Resource Strain: Low Risk  (12/01/2022)   Overall Financial Resource Strain (CARDIA)    Difficulty of Paying Living Expenses: Not very hard  Food Insecurity: No Food Insecurity (12/01/2022)   Hunger Vital Sign    Worried About Running Out of Food in the Last Year: Never true    Ran Out of Food in the Last Year: Never true  Transportation Needs: No Transportation Needs (12/01/2022)   PRAPARE - Administrator, Civil Service (Medical): No    Lack of Transportation (Non-Medical): No  Physical Activity: Inactive (12/01/2022)   Exercise Vital Sign    Days of Exercise per Week: 0 days    Minutes of Exercise per Session: 10 min  Stress: Stress Concern Present (12/01/2022)   [employer] of Occupational Health - Occupational Stress Questionnaire    Feeling of Stress : To some extent  Social Connections: Socially Isolated (12/01/2022)   Social Connection and Isolation Panel [NHANES]    Frequency of Communication with Friends and Family: More than three times a week    Frequency of Social Gatherings with Friends and Family: More than three times a week    Attends Religious Services: Never    Database administrator or Organizations: No    Attends Engineer, structural: Never    Marital Status: Never married   Family History  Problem Relation Age of Onset   Hypertension Mother    Cancer Father    Hypertension Father    Breast cancer Maternal Aunt 50    Heart attack Other     ASSESSMENT Recent Results: The most recent result is correlated with not having taken warfarin for 5-6 days.  Lab Results  Component Value Date   INR 1.4 (A) 04/26/2023   INR 1.1 (A) 03/25/2023   INR 2.6 (H) 11/18/2022    Anticoagulation Dosing: Description   Take one-and-one-half (1 & 1/2) of your 5mg  peach-colored warfarin tablets on Mondays and Thursdays. All other days, take only one (1) tablet.      INR today: Subtherapeutic  PLAN Weekly dose  was increased by 20+% to 40 mg per week  Patient Instructions  Patient instructed to take medications as defined in the Anti-coagulation Track section of this encounter.  Patient instructed to take today's dose.  Patient instructed to take one-and-one-half (1 & 1/2) of your 5mg  peach-colored warfarin tablets on Mondays and Thursdays. All other days, take only one (1) tablet.  Patient verbalized understanding of these instructions.  Patient advised to contact clinic or seek medical attention if signs/symptoms of bleeding or thromboembolism occur.  Patient verbalized understanding by repeating back information and was advised to contact me if further medication-related questions arise. Patient was also provided an information handout.  Follow-up Return in 3 weeks (on 05/17/2023) for Follow up INR.  Elicia Lamp, PharmD, CPP  15 minutes spent face-to-face with the patient during the encounter. 50% of time spent on education, including signs/sx bleeding and clotting, as well as food and drug interactions with warfarin. 50% of time was spent on fingerprick POC INR sample collection,processing, results determination, and documentation in [email or web address].

## 2023-05-05 DIAGNOSIS — M170 Bilateral primary osteoarthritis of knee: Secondary | ICD-10-CM | POA: Diagnosis not present

## 2023-05-10 ENCOUNTER — Other Ambulatory Visit: Payer: Self-pay | Admitting: Internal Medicine

## 2023-05-10 ENCOUNTER — Telehealth: Payer: Self-pay | Admitting: *Deleted

## 2023-05-10 DIAGNOSIS — J449 Chronic obstructive pulmonary disease, unspecified: Secondary | ICD-10-CM

## 2023-05-10 MED ORDER — HYDROCODONE-ACETAMINOPHEN 5-325 MG PO TABS: 1.0000 | ORAL_TABLET | ORAL | 0 refills | Status: DC | PRN

## 2023-05-10 NOTE — Telephone Encounter (Signed)
Kim Anthony called for a refill on her hydrocodone.

## 2023-05-13 ENCOUNTER — Telehealth: Payer: Self-pay

## 2023-05-13 NOTE — Telephone Encounter (Signed)
Patients daughter left voicemail on Triage phone stating patient was in a lot of pain in the hip she had surgery on in April and wanted to get her in to see Roda Shutters as soon as she could.

## 2023-05-13 NOTE — Telephone Encounter (Signed)
Spoke with patient. Dr.Xu does not have anything until Dec 4th. She has had no injury, and been hurting for 3 days. I offered her an appointment tomorrow with Jean Rosenthal. She said that her daughter will call back for scheduling.

## 2023-05-17 ENCOUNTER — Other Ambulatory Visit: Payer: Self-pay

## 2023-05-17 ENCOUNTER — Ambulatory Visit: Payer: 59

## 2023-05-17 DIAGNOSIS — T7840XA Allergy, unspecified, initial encounter: Secondary | ICD-10-CM

## 2023-05-17 MED ORDER — FLUTICASONE PROPIONATE 50 MCG/ACT NA SUSP
2.0000 | Freq: Every day | NASAL | 3 refills | Status: DC
Start: 2023-05-17 — End: 2023-10-31

## 2023-05-17 NOTE — Telephone Encounter (Signed)
Medication sent to pharmacy  

## 2023-05-31 ENCOUNTER — Encounter: Payer: 59 | Admitting: Physical Medicine and Rehabilitation

## 2023-06-03 ENCOUNTER — Other Ambulatory Visit: Payer: Self-pay | Admitting: Student

## 2023-06-03 DIAGNOSIS — I5042 Chronic combined systolic (congestive) and diastolic (congestive) heart failure: Secondary | ICD-10-CM

## 2023-06-07 ENCOUNTER — Encounter (HOSPITAL_COMMUNITY): Payer: Self-pay

## 2023-06-07 ENCOUNTER — Telehealth: Payer: Self-pay

## 2023-06-07 ENCOUNTER — Ambulatory Visit (HOSPITAL_COMMUNITY)
Admission: EM | Admit: 2023-06-07 | Discharge: 2023-06-07 | Disposition: A | Discharge status: Home / Self Care | Payer: 59 | Attending: Family Medicine | Admitting: Family Medicine

## 2023-06-07 DIAGNOSIS — J441 Chronic obstructive pulmonary disease with (acute) exacerbation: Secondary | ICD-10-CM | POA: Insufficient documentation

## 2023-06-07 DIAGNOSIS — R062 Wheezing: Secondary | ICD-10-CM | POA: Insufficient documentation

## 2023-06-07 DIAGNOSIS — J449 Chronic obstructive pulmonary disease, unspecified: Secondary | ICD-10-CM | POA: Diagnosis present

## 2023-06-07 LAB — RESPIRATORY PANEL BY PCR

## 2023-06-07 MED ORDER — IPRATROPIUM-ALBUTEROL 0.5-2.5 (3) MG/3ML IN SOLN
3.0000 mL | Freq: Once | RESPIRATORY_TRACT | Status: AC
  Administered 2023-06-07: 3 mL via RESPIRATORY_TRACT

## 2023-06-07 MED ORDER — PREDNISONE 20 MG PO TABS: 40.0000 mg | ORAL_TABLET | Freq: Every day | ORAL | 0 refills | Status: AC

## 2023-06-07 MED ORDER — IPRATROPIUM-ALBUTEROL 0.5-2.5 (3) MG/3ML IN SOLN
RESPIRATORY_TRACT | Status: AC
  Filled 2023-06-07: qty 3

## 2023-06-07 NOTE — Discharge Instructions (Signed)
Please take your prednisone 40mg  daily for the next 5 days  Please follow up with [place] if you do not have any improvement

## 2023-06-07 NOTE — Telephone Encounter (Signed)
Pmp report:   Filled  Written  ID  Drug  QTY  Days  Prescriber  RX #  Dispenser  Refill  Daily Dose*  Pymt Type  PMP  05/14/2023 03/01/2023 1  Gabapentin 800 Mg Tablet 90.00 30 Me Lov [id number] [facility] ([id number]) 3/5  Medicare [place] 05/10/2023 05/10/2023 1  Hydrocodone-Acetamin 5-325 Mg 150.00 25 Me Lov [id number] [facility] ([id number]) 0/0 30.00 MME Medicare [place] 04/21/2023 03/01/2023 1  Gabapentin 800 Mg Tablet 90.00 30 Me Lov [id number] [facility] ([id number]) 2/5  Medicare N

## 2023-06-07 NOTE — ED Triage Notes (Signed)
Pt c/o productive cough with yellow sputum, wheezing, and SOB on exertion at times x4 days. States using her inhaler with relief. Hx of asthma and COPD. Pt in NAD, speaking in complete sentences.

## 2023-06-07 NOTE — ED Provider Notes (Signed)
[facility]    CSN: [id number] Arrival date & time: 06/07/23  1103      History   Chief Complaint Chief Complaint  Patient presents with   Shortness of Breath   Wheezing    HPI Kim Anthony is a 72 y.o. female.   Patient presenting with cough since the weekend.  Patient states that she noticed that she was having some wheezing last night and was told by her daughter to come in.  Patient has a history of COPD and does take Trelegy daily, patient states that she is having to use her albuterol more.  Patient notes a cough that is little bit dry.  Patient denies any fevers or chills.  Patient has no other concerns at this time.   Shortness of Breath Associated symptoms: wheezing   Wheezing Associated symptoms: shortness of breath     Past Medical History:  Diagnosis Date   Arthritis    Asthma    Breast cancer (HCC) 2001   Left Breast Cancer   CHF (congestive heart failure) (HCC)    COPD (chronic obstructive pulmonary disease) (HCC)    Coronary artery disease    GERD (gastroesophageal reflux disease)    History of mitral valve replacement with mechanical valve    HLD (hyperlipidemia)    Hypertension    Pulmonary embolism (HCC) 2021    Patient Active Problem List   Diagnosis Date Noted   Bilateral hearing loss 12/01/2022   Hypokalemia 11/18/2022   Thrombocytosis 10/16/2022   Hyperkalemia 10/08/2022   AKI (acute kidney injury) (HCC) 10/07/2022   Alteration in anticoagulation 09/25/2022   History of mitral valve replacement with mechanical valve 09/24/2022   Anticoagulated 09/24/2022   Closed fracture of right hip (HCC) 09/23/2022   Closed intertrochanteric fracture of right femur, initial encounter (HCC) 09/22/2022   H/O mitral valve replacement with mechanical valve 09/22/2022   Chronic HFrEF (heart failure with reduced ejection fraction) (HCC) 09/22/2022   Endometrial thickening on ultrasound 07/07/2022   Gastroesophageal reflux disease without  esophagitis 06/29/2022   Chronic pain of both knees 06/29/2022   Vaginal bleeding 04/01/2022   A-fib (HCC) 03/13/2022   Facial edema 03/07/2022   Atypical atrial flutter (HCC)    Combined systolic and diastolic heart failure (HCC) 02/19/2022   CHF (congestive heart failure) (HCC) 02/19/2022   Combined systolic and diastolic congestive heart failure (HCC) 02/18/2022   Acute pulmonary edema (HCC)    Edema of both lower extremities 01/13/2022   Hypotension 01/13/2022   Subacute cough 11/28/2021   COPD exacerbation (HCC) 11/05/2021   Conjunctival hemorrhage of left eye 06/25/2021   Osteoarthritis of left knee 04/24/2021   Seronegative inflammatory arthritis 02/17/2021   High risk medication use 02/17/2021   Elevated transaminase level 01/27/2021   Acute idiopathic gout of multiple sites 09/30/2020   Chronic pain syndrome 01/17/2020   Monoarthritis of wrist 10/18/2019   Ganglion cyst 09/21/2019   Hip pain 08/23/2019   Thoracic ascending aortic aneurysm (HCC) 07/05/2019   Macrocytic anemia 02/16/2019   Breast cancer (HCC)    Acquired absence of left breast 12/20/2018   COPD (chronic obstructive pulmonary disease) (HCC) 11/25/2017   S/P MVR (mitral valve replacement)    Chronic anticoagulation    Tobacco use disorder 02/25/2017   Lymphadenopathy, mediastinal 02/25/2017   Essential hypertension 02/01/2016   Allergic rhinitis 02/01/2016   Healthcare maintenance 01/30/2016   Chronic low back pain 07/22/2015   Pulmonary embolism (HCC) 07/11/2015   Chronic combined systolic and diastolic  heart failure (HCC) 07/11/2015   Headache, common migraine 07/11/2015   HLD (hyperlipidemia) 07/11/2015    Past Surgical History:  Procedure Laterality Date   APPLICATION OF A-CELL OF CHEST/ABDOMEN Left 01/27/2019   Procedure: APPLICATION OF A-CELL OF CHEST;  Surgeon: Peggye Form, DO;  Location: [facility] OR;  Service: Plastics;  Laterality: Left;   APPLICATION OF WOUND VAC N/A 01/27/2019    Procedure: APPLICATION OF WOUND VAC;  Surgeon: Peggye Form, DO;  Location: [facility] OR;  Service: Plastics;  Laterality: N/A;   BREAST SURGERY Left 2001   for CA   CARDIOVERSION N/A 02/26/2022   Procedure: CARDIOVERSION;  Surgeon: Little Ishikawa, MD;  Location: [facility] ENDOSCOPY;  Service: Cardiovascular;  Laterality: N/A;   COLONOSCOPY WITH PROPOFOL N/A 06/07/2018   Procedure: COLONOSCOPY WITH PROPOFOL;  Surgeon: Charlott Rakes, MD;  Location: [facility] ENDOSCOPY;  Service: Endoscopy;  Laterality: N/A;   DEBRIDEMENT AND CLOSURE WOUND Left 12/28/2018   Procedure: Debridement And Closure Wound;  Surgeon: Abigail Miyamoto, MD;  Location: [facility] OR;  Service: General;  Laterality: Left;   INCISION AND DRAINAGE OF WOUND Left 01/27/2019   Procedure: IRRIGATION AND DEBRIDEMENT OF CHEST WALL;  Surgeon: Peggye Form, DO;  Location: [facility] OR;  Service: Plastics;  Laterality: Left;   INTRAMEDULLARY (IM) NAIL INTERTROCHANTERIC N/A 09/23/2022   Procedure: INTRAMEDULLARY (IM) NAIL INTERTROCHANTERIC;  Surgeon: Tarry Kos, MD;  Location: [facility] OR;  Service: Orthopedics;  Laterality: N/A;   KNEE ARTHROSCOPY WITH MEDIAL MENISECTOMY Left 08/06/2021   Procedure: KNEE ARTHROSCOPY WITH PARTIAL MEDIAL MENISECTOMY, DEBRIDEMENT;  Surgeon: Jene Every, MD;  Location: [facility] ORS;  Service: Orthopedics;  Laterality: Left;  1 HR   LATISSIMUS FLAP TO BREAST Left 01/18/2019   Procedure: LEFT LATISSIMUS FLAP TO BREAST;  Surgeon: Peggye Form, DO;  Location: [facility];  Service: Plastics;  Laterality: Left;   MASTECTOMY Left    MITRAL VALVE REPLACEMENT     mechanical   POLYPECTOMY  06/07/2018   Procedure: POLYPECTOMY;  Surgeon: Charlott Rakes, MD;  Location: [facility] ENDOSCOPY;  Service: Endoscopy;;   RIGHT/LEFT HEART CATH AND CORONARY ANGIOGRAPHY N/A 01/09/2021   Procedure: RIGHT/LEFT HEART CATH AND CORONARY ANGIOGRAPHY;  Surgeon: Dolores Patty, MD;  Location: [facility];  Service: Cardiovascular;   Laterality: N/A;   WOUND DEBRIDEMENT Left 12/28/2018   Procedure: EXCISION OF LEFT CHRONIC CHEST WALL WOUND;  Surgeon: Abigail Miyamoto, MD;  Location: [facility] OR;  Service: General;  Laterality: Left;    OB History     Gravida  2   Para  2   Term  2   Preterm      AB      Living         SAB      IAB      Ectopic      Multiple      Live Births  2            Home Medications    Prior to Admission medications   Medication Sig Start Date End Date Taking? Authorizing Provider  predniSONE (DELTASONE) 20 MG tablet Take 2 tablets (40 mg total) by mouth daily for 5 days. 06/07/23 06/12/23 Yes Brenton Grills, MD  albuterol (VENTOLIN HFA) 108 (90 Base) MCG/ACT inhaler INHALE 2 PUFFS BY MOUTH EVERY 6 HOURS AS NEEDED FOR WHEEZING FOR SHORTNESS OF BREATH 05/10/23   Kathleen Lime, MD  DULoxetine (CYMBALTA) 20 MG capsule Take 20 mg by mouth at bedtime. As needed    [provider]  FARXIGA 10 MG TABS tablet Take 1 tablet by mouth once daily 06/03/23   Kathleen Lime, MD  fluticasone [facility]) 50 MCG/ACT nasal spray Place 2 sprays into both nostrils daily. 05/17/23 05/16/24  Kathleen Lime, MD  Fluticasone-Umeclidin-Vilant (TRELEGY ELLIPTA) 100-62.5-25 MCG/ACT AEPB Inhale 1 puff into the lungs daily. 04/16/23   Philomena Doheny, MD  gabapentin (NEURONTIN) 800 MG tablet Take 1 tablet (800 mg total) by mouth 3 (three) times daily. 03/01/23   Lovorn, Aundra Millet, MD  HYDROcodone-acetaminophen (NORCO/VICODIN) 5-325 MG tablet Take 1 tablet by mouth every 4 (four) hours as needed for moderate pain (pain score 4-6). 05/10/23   Lovorn, Aundra Millet, MD  losartan (COZAAR) 25 MG tablet Take 1/2 (one-half) tablet by mouth once daily 02/26/23   Bensimhon, Bevelyn Buckles, MD  metoprolol succinate (TOPROL-XL) 25 MG 24 hr tablet Take 1 tablet (25 mg total) by mouth daily. 12/03/22   Willette Cluster, MD  pantoprazole (PROTONIX) 40 MG tablet Take 1 tablet (40 mg total) by mouth daily. 04/16/23   Philomena Doheny, MD  potassium chloride (KLOR-CON M) 10 MEQ tablet Take 4 tablets (40 mEq total) by mouth daily. 01/19/23 08/17/23  Jacklynn Ganong, FNP  simvastatin (ZOCOR) 20 MG tablet Take 1 tablet (20 mg total) by mouth daily. 12/03/22   Willette Cluster, MD  torsemide (DEMADEX) 20 MG tablet Take 3 tablets (60 mg total) by mouth daily. 01/21/23   Jacklynn Ganong, FNP  warfarin (COUMADIN) 5 MG tablet Take one-and-one-half (1 & 1/2) of your 5 mg peach colored warfarin tablets on Mondays and Thursdays. All other days, take only one (1) tablet. 04/26/23   Elicia Lamp, RPH-CPP    Family History Family History  Problem Relation Age of Onset   Hypertension Mother    Cancer Father    Hypertension Father    Breast cancer Maternal Aunt 50   Heart attack Other     Social History Social History   Tobacco Use   Smoking status: Every Day    Current packs/day: 0.10    Average packs/day: 0.1 packs/day for 30.0 years (3.0 ttl pk-yrs)    Types: Cigarettes   Smokeless tobacco: Never   Tobacco comments:    5 cigs per day  Vaping Use   Vaping status: Never Used  Substance Use Topics   Alcohol use: Yes    Alcohol/week: 3.0 standard drinks of alcohol    Types: 3 Standard drinks or equivalent per week    Comment: beer- ocasional   Drug use: No     Allergies   Lisinopril, Acetaminophen-codeine, Propoxyphene, Tape, and Tramadol   Review of Systems Review of Systems  Respiratory:  Positive for shortness of breath and wheezing.      Physical Exam Triage Vital Signs ED Triage Vitals  Encounter Vitals Group     BP 06/07/23 1112 (!) 157/92     Systolic BP Percentile --      Diastolic BP Percentile --      Pulse Rate 06/07/23 1112 82     Resp 06/07/23 1112 18     Temp 06/07/23 1112 98.9 F (37.2 C)     Temp src --      SpO2 06/07/23 1112 98 %     Weight --      Height --      Head Circumference --      Peak Flow --      Pain Score 06/07/23 1114 0     Pain Loc --  Pain Education  --      Exclude from Growth Chart --    No data found.  Updated Vital Signs BP (!) 157/92 (BP Location: Right Arm)   Pulse 82   Temp 98.9 F (37.2 C)   Resp 18   SpO2 98%   Visual Acuity Right Eye Distance:   Left Eye Distance:   Bilateral Distance:    Right Eye Near:   Left Eye Near:    Bilateral Near:     Physical Exam Constitutional:      Appearance: She is well-developed.  Pulmonary:     Effort: Pulmonary effort is normal. No tachypnea, bradypnea or respiratory distress.     Breath sounds: Wheezing (Mild expiratory wheeze) present. No decreased breath sounds.  Neurological:     Mental Status: She is alert.      UC Treatments / Results  Labs (all labs ordered are listed, but only abnormal results are displayed) Labs Reviewed  RESPIRATORY PANEL BY PCR    EKG   Radiology No results found.  Procedures Procedures (including critical care time)  Medications Ordered in UC Medications  ipratropium-albuterol (DUONEB) 0.5-2.5 (3) MG/3ML nebulizer solution 3 mL (3 mLs Nebulization Given 06/07/23 1127)    Initial Impression / Assessment and Plan / UC Course  I have reviewed the triage vital signs and the nursing notes.  Pertinent labs & imaging results that were available during my care of the patient were reviewed by me and considered in my medical decision making (see chart for details).     Patient with history of COPD dealing with possible mild exacerbation.  Gave patient a DuoNeb treatment and patient notes improvement after DuoNeb treatment while in clinic.  Will also do a respiratory panel to evaluate for any specific etiology of patient's viral illness.  Will also go ahead and give patient a prednisone pack.  Patient to do 40 mg for the next 5 days, patient advised to follow-up if no improvement.  Sputum production is minimal and there is no concern for any underlying infection along with a COPD at this time.  Patient is with daughter and will follow-up  if needed.  Patient is understanding and agreeable with plan. Final Clinical Impressions(s) / UC Diagnoses   Final diagnoses:  Wheezing  COPD exacerbation [facility])     Discharge Instructions      Please take your prednisone 40mg  daily for the next 5 days  Please follow up with [place] if you do not have any improvement      ED Prescriptions     Medication Sig Dispense Auth. Provider   predniSONE (DELTASONE) 20 MG tablet Take 2 tablets (40 mg total) by mouth daily for 5 days. 10 tablet Brenton Grills, MD      PDMP not reviewed this encounter.   Brenton Grills, MD 06/07/23 [phone]

## 2023-06-08 MED ORDER — HYDROCODONE-ACETAMINOPHEN 5-325 MG PO TABS: 1.0000 | ORAL_TABLET | ORAL | 0 refills | Status: DC | PRN

## 2023-06-09 ENCOUNTER — Other Ambulatory Visit: Payer: Self-pay | Admitting: Internal Medicine

## 2023-06-09 DIAGNOSIS — Z1231 Encounter for screening mammogram for malignant neoplasm of breast: Secondary | ICD-10-CM

## 2023-06-14 ENCOUNTER — Other Ambulatory Visit: Payer: Self-pay

## 2023-06-14 DIAGNOSIS — J441 Chronic obstructive pulmonary disease with (acute) exacerbation: Secondary | ICD-10-CM

## 2023-06-14 MED ORDER — TRELEGY ELLIPTA 100-62.5-25 MCG/ACT IN AEPB
1.0000 | INHALATION_SPRAY | Freq: Every day | RESPIRATORY_TRACT | 0 refills | Status: DC
Start: 2023-06-14 — End: 2023-07-15

## 2023-07-06 ENCOUNTER — Ambulatory Visit: Payer: 59

## 2023-07-07 ENCOUNTER — Telehealth: Payer: Self-pay

## 2023-07-07 ENCOUNTER — Encounter: Payer: 59 | Admitting: Physical Medicine and Rehabilitation

## 2023-07-07 NOTE — Telephone Encounter (Signed)
 Refill being requested for Hydrocodone  per pmp last fil  06/08/2023 06/08/2023 1  Hydrocodone -Acetamin 5-325 Mg 150.00 25 Me Lov [id number] [facility] ([id number]) 0/0 30.00 MME Medicare [place]

## 2023-07-08 ENCOUNTER — Encounter (HOSPITAL_COMMUNITY): Payer: Self-pay

## 2023-07-08 ENCOUNTER — Ambulatory Visit (HOSPITAL_COMMUNITY)
Admission: EM | Admit: 2023-07-08 | Discharge: 2023-07-08 | Disposition: A | Discharge status: Home / Self Care | Payer: 59 | Attending: Internal Medicine | Admitting: Internal Medicine

## 2023-07-08 DIAGNOSIS — M79641 Pain in right hand: Secondary | ICD-10-CM | POA: Insufficient documentation

## 2023-07-08 DIAGNOSIS — M79672 Pain in left foot: Secondary | ICD-10-CM

## 2023-07-08 DIAGNOSIS — M79642 Pain in left hand: Secondary | ICD-10-CM | POA: Diagnosis not present

## 2023-07-08 LAB — URIC ACID: Uric Acid, Serum: 11 mg/dL — ABNORMAL HIGH (ref 2.5–7.1)

## 2023-07-08 MED ORDER — HYDROCODONE-ACETAMINOPHEN 5-325 MG PO TABS: 1.0000 | ORAL_TABLET | ORAL | 0 refills | Status: DC | PRN

## 2023-07-08 MED ORDER — COLCHICINE 0.6 MG PO TABS: 0.6000 mg | ORAL_TABLET | Freq: Every day | ORAL | 0 refills | Status: DC

## 2023-07-08 MED ORDER — METHYLPREDNISOLONE 4 MG PO TBPK: ORAL_TABLET | ORAL | 0 refills | Status: DC

## 2023-07-08 NOTE — ED Provider Notes (Signed)
[facility]    CSN: [id number] Arrival date & time: 07/08/23  1337      History   Chief Complaint Chief Complaint  Patient presents with   Foot Pain   Hand Pain    HPI Kim Anthony is a 73 y.o. female who presents with her daughter due to developing  L foot pain and bilateral hand pain x 4 days. Has hx of gout. States she did eat a lot of meat last week, and eats sweets all the time.     Past Medical History:  Diagnosis Date   Arthritis    Asthma    Breast cancer (HCC) 2001   Left Breast Cancer   CHF (congestive heart failure) (HCC)    COPD (chronic obstructive pulmonary disease) (HCC)    Coronary artery disease    GERD (gastroesophageal reflux disease)    History of mitral valve replacement with mechanical valve    HLD (hyperlipidemia)    Hypertension    Pulmonary embolism (HCC) 2021    Patient Active Problem List   Diagnosis Date Noted   Bilateral hearing loss 12/01/2022   Hypokalemia 11/18/2022   Thrombocytosis 10/16/2022   Hyperkalemia 10/08/2022   AKI (acute kidney injury) (HCC) 10/07/2022   Alteration in anticoagulation 09/25/2022   History of mitral valve replacement with mechanical valve 09/24/2022   Anticoagulated 09/24/2022   Closed fracture of right hip (HCC) 09/23/2022   Closed intertrochanteric fracture of right femur, initial encounter (HCC) 09/22/2022   H/O mitral valve replacement with mechanical valve 09/22/2022   Chronic HFrEF (heart failure with reduced ejection fraction) (HCC) 09/22/2022   Endometrial thickening on ultrasound 07/07/2022   Gastroesophageal reflux disease without esophagitis 06/29/2022   Chronic pain of both knees 06/29/2022   Vaginal bleeding 04/01/2022   A-fib (HCC) 03/13/2022   Facial edema 03/07/2022   Atypical atrial flutter (HCC)    Combined systolic and diastolic heart failure (HCC) 02/19/2022   CHF (congestive heart failure) (HCC) 02/19/2022   Combined systolic and diastolic congestive heart failure  (HCC) 02/18/2022   Acute pulmonary edema (HCC)    Edema of both lower extremities 01/13/2022   Hypotension 01/13/2022   Subacute cough 11/28/2021   COPD exacerbation (HCC) 11/05/2021   Conjunctival hemorrhage of left eye 06/25/2021   Osteoarthritis of left knee 04/24/2021   Seronegative inflammatory arthritis 02/17/2021   High risk medication use 02/17/2021   Elevated transaminase level 01/27/2021   Acute idiopathic gout of multiple sites 09/30/2020   Chronic pain syndrome 01/17/2020   Monoarthritis of wrist 10/18/2019   Ganglion cyst 09/21/2019   Hip pain 08/23/2019   Thoracic ascending aortic aneurysm (HCC) 07/05/2019   Macrocytic anemia 02/16/2019   Breast cancer (HCC)    Acquired absence of left breast 12/20/2018   COPD (chronic obstructive pulmonary disease) (HCC) 11/25/2017   S/P MVR (mitral valve replacement)    Chronic anticoagulation    Tobacco use disorder 02/25/2017   Lymphadenopathy, mediastinal 02/25/2017   Essential hypertension 02/01/2016   Allergic rhinitis 02/01/2016   Healthcare maintenance 01/30/2016   Chronic low back pain 07/22/2015   Pulmonary embolism (HCC) 07/11/2015   Chronic combined systolic and diastolic heart failure (HCC) 07/11/2015   Headache, common migraine 07/11/2015   HLD (hyperlipidemia) 07/11/2015    Past Surgical History:  Procedure Laterality Date   APPLICATION OF A-CELL OF CHEST/ABDOMEN Left 01/27/2019   Procedure: APPLICATION OF A-CELL OF CHEST;  Surgeon: Peggye Form, DO;  Location: [facility] OR;  Service: Plastics;  Laterality: Left;  APPLICATION OF WOUND VAC N/A 01/27/2019   Procedure: APPLICATION OF WOUND VAC;  Surgeon: Peggye Form, DO;  Location: [facility] OR;  Service: Plastics;  Laterality: N/A;   BREAST SURGERY Left 2001   for CA   CARDIOVERSION N/A 02/26/2022   Procedure: CARDIOVERSION;  Surgeon: Little Ishikawa, MD;  Location: [facility] ENDOSCOPY;  Service: Cardiovascular;  Laterality: N/A;   COLONOSCOPY WITH  PROPOFOL N/A 06/07/2018   Procedure: COLONOSCOPY WITH PROPOFOL;  Surgeon: Charlott Rakes, MD;  Location: [facility] ENDOSCOPY;  Service: Endoscopy;  Laterality: N/A;   DEBRIDEMENT AND CLOSURE WOUND Left 12/28/2018   Procedure: Debridement And Closure Wound;  Surgeon: Abigail Miyamoto, MD;  Location: [facility] OR;  Service: General;  Laterality: Left;   INCISION AND DRAINAGE OF WOUND Left 01/27/2019   Procedure: IRRIGATION AND DEBRIDEMENT OF CHEST WALL;  Surgeon: Peggye Form, DO;  Location: [facility] OR;  Service: Plastics;  Laterality: Left;   INTRAMEDULLARY (IM) NAIL INTERTROCHANTERIC N/A 09/23/2022   Procedure: INTRAMEDULLARY (IM) NAIL INTERTROCHANTERIC;  Surgeon: Tarry Kos, MD;  Location: [facility] OR;  Service: Orthopedics;  Laterality: N/A;   KNEE ARTHROSCOPY WITH MEDIAL MENISECTOMY Left 08/06/2021   Procedure: KNEE ARTHROSCOPY WITH PARTIAL MEDIAL MENISECTOMY, DEBRIDEMENT;  Surgeon: Jene Every, MD;  Location: [facility] ORS;  Service: Orthopedics;  Laterality: Left;  1 HR   LATISSIMUS FLAP TO BREAST Left 01/18/2019   Procedure: LEFT LATISSIMUS FLAP TO BREAST;  Surgeon: Peggye Form, DO;  Location: [facility] OR;  Service: Plastics;  Laterality: Left;   MASTECTOMY Left    MITRAL VALVE REPLACEMENT     mechanical   POLYPECTOMY  06/07/2018   Procedure: POLYPECTOMY;  Surgeon: Charlott Rakes, MD;  Location: [facility] ENDOSCOPY;  Service: Endoscopy;;   RIGHT/LEFT HEART CATH AND CORONARY ANGIOGRAPHY N/A 01/09/2021   Procedure: RIGHT/LEFT HEART CATH AND CORONARY ANGIOGRAPHY;  Surgeon: Dolores Patty, MD;  Location: [facility];  Service: Cardiovascular;  Laterality: N/A;   WOUND DEBRIDEMENT Left 12/28/2018   Procedure: EXCISION OF LEFT CHRONIC CHEST WALL WOUND;  Surgeon: Abigail Miyamoto, MD;  Location: [facility] OR;  Service: General;  Laterality: Left;    OB History     Gravida  2   Para  2   Term  2   Preterm      AB      Living         SAB      IAB      Ectopic      Multiple      Live  Births  2            Home Medications    Prior to Admission medications   Medication Sig Start Date End Date Taking? Authorizing Provider  colchicine 0.6 MG tablet Take 1 tablet (0.6 mg total) by mouth daily. 07/08/23  Yes Rodriguez-Southworth, Nettie Elm, PA-C  methylPREDNISolone (MEDROL DOSEPAK) 4 MG TBPK tablet Take as directed 07/08/23  Yes Rodriguez-Southworth, Nettie Elm, PA-C  albuterol (VENTOLIN HFA) 108 (90 Base) MCG/ACT inhaler INHALE 2 PUFFS BY MOUTH EVERY 6 HOURS AS NEEDED FOR WHEEZING FOR SHORTNESS OF BREATH 05/10/23   Kathleen Lime, MD  DULoxetine (CYMBALTA) 20 MG capsule Take 20 mg by mouth at bedtime. As needed    [provider]  FARXIGA 10 MG TABS tablet Take 1 tablet by mouth once daily 06/03/23   Kathleen Lime, MD  fluticasone [facility]) 50 MCG/ACT nasal spray Place 2 sprays into both nostrils daily. 05/17/23 05/16/24  Kathleen Lime, MD  Fluticasone-Umeclidin-Vilant (TRELEGY ELLIPTA) 100-62.5-25 MCG/ACT  AEPB Inhale 1 puff into the lungs daily. 06/14/23   Kathleen Lime, MD  gabapentin (NEURONTIN) 800 MG tablet Take 1 tablet (800 mg total) by mouth 3 (three) times daily. 03/01/23   Lovorn, Aundra Millet, MD  HYDROcodone-acetaminophen (NORCO/VICODIN) 5-325 MG tablet Take 1 tablet by mouth every 4 (four) hours as needed for moderate pain (pain score 4-6). 07/08/23   Lovorn, Aundra Millet, MD  losartan (COZAAR) 25 MG tablet Take 1/2 (one-half) tablet by mouth once daily 02/26/23   Bensimhon, Bevelyn Buckles, MD  metoprolol succinate (TOPROL-XL) 25 MG 24 hr tablet Take 1 tablet (25 mg total) by mouth daily. 12/03/22   Willette Cluster, MD  pantoprazole (PROTONIX) 40 MG tablet Take 1 tablet (40 mg total) by mouth daily. 04/16/23   Philomena Doheny, MD  potassium chloride (KLOR-CON M) 10 MEQ tablet Take 4 tablets (40 mEq total) by mouth daily. 01/19/23 08/17/23  Jacklynn Ganong, FNP  simvastatin (ZOCOR) 20 MG tablet Take 1 tablet (20 mg total) by mouth daily. 12/03/22   Willette Cluster, MD  torsemide  (DEMADEX) 20 MG tablet Take 3 tablets (60 mg total) by mouth daily. 01/21/23   Jacklynn Ganong, FNP  warfarin (COUMADIN) 5 MG tablet Take one-and-one-half (1 & 1/2) of your 5 mg peach colored warfarin tablets on Mondays and Thursdays. All other days, take only one (1) tablet. 04/26/23   Elicia Lamp, RPH-CPP    Family History Family History  Problem Relation Age of Onset   Hypertension Mother    Cancer Father    Hypertension Father    Breast cancer Maternal Aunt 50   Heart attack Other     Social History Social History   Tobacco Use   Smoking status: Every Day    Current packs/day: 0.10    Average packs/day: 0.1 packs/day for 30.0 years (3.0 ttl pk-yrs)    Types: Cigarettes   Smokeless tobacco: Never   Tobacco comments:    5 cigs per day  Vaping Use   Vaping status: Never Used  Substance Use Topics   Alcohol use: Yes    Alcohol/week: 3.0 standard drinks of alcohol    Types: 3 Standard drinks or equivalent per week    Comment: beer- ocasional   Drug use: No     Allergies   Lisinopril, Acetaminophen-codeine, Propoxyphene, Tape, and Tramadol   Review of Systems Review of Systems As noted in HPI  Physical Exam Triage Vital Signs ED Triage Vitals  Encounter Vitals Group     BP 07/08/23 1525 135/80     Systolic BP Percentile --      Diastolic BP Percentile --      Pulse Rate 07/08/23 1525 87     Resp 07/08/23 1525 16     Temp 07/08/23 1525 98.2 F (36.8 C)     Temp Source 07/08/23 1525 Oral     SpO2 07/08/23 1525 92 %     Weight 07/08/23 1523 160 lb (72.6 kg)     Height 07/08/23 1523 5\' 5"  (1.651 m)     Head Circumference --      Peak Flow --      Pain Score 07/08/23 1523 9     Pain Loc --      Pain Education --      Exclude from Growth Chart --    No data found.  Updated Vital Signs BP 135/80 (BP Location: Right Arm)   Pulse 87   Temp 98.2 F (36.8 C) (Oral)   Resp  16   Ht 5\' 5"  (1.651 m)   Wt 160 lb (72.6 kg)   SpO2 92%   BMI 26.63 kg/m    Visual Acuity Right Eye Distance:   Left Eye Distance:   Bilateral Distance:    Right Eye Near:   Left Eye Near:    Bilateral Near:     Physical Exam Vitals and nursing note reviewed.  Constitutional:      General: She is not in acute distress.    Appearance: She is normal weight. She is not toxic-appearing.  Eyes:     General: No scleral icterus.    Conjunctiva/sclera: Conjunctivae normal.  Pulmonary:     Effort: Pulmonary effort is normal.  Musculoskeletal:     Cervical back: Neck supple.     Comments: L ANKLE- has local tenderness on lateral malleolus which is is slightly swollen, but not  red or hot. ROM was decreased due to pain. HANDS- both have moderate swelling  on dorsal area at the 2nd metacarpal region and R knuckles with mild warmth and erythema. Thera are no wounds. ROM is limited due to pain  Skin:    General: Skin is warm and dry.     Findings: No bruising, lesion or rash.  Neurological:     Mental Status: She is alert and oriented to person, place, and time.     Gait: Gait normal.  Psychiatric:        Mood and Affect: Mood normal.        Behavior: Behavior normal.        Thought Content: Thought content normal.        Judgment: Judgment normal.      UC Treatments / Results  Labs (all labs ordered are listed, but only abnormal results are displayed) Labs Reviewed  URIC ACID    EKG   Radiology No results found.  Procedures Procedures (including critical care time)  Medications Ordered in UC Medications - No data to display  Initial Impression / Assessment and Plan / UC Course  I have reviewed the triage vital signs and the nursing notes. Since she has never had a uric acid test done when she has this flairs I ordered one and we will inform her daughter via mychart of results. In the mean time I placed her on Colcrys and Medrol dose pack as noted Advised to start the Medrol tomorrow.    Final Clinical Impressions(s) / UC Diagnoses    Final diagnoses:  Foot pain, left  Bilateral hand pain   Discharge Instructions   None    ED Prescriptions     Medication Sig Dispense Auth. Provider   colchicine 0.6 MG tablet Take 1 tablet (0.6 mg total) by mouth daily. 3 tablet Rodriguez-Southworth, Genova Kiner, PA-C   methylPREDNISolone (MEDROL DOSEPAK) 4 MG TBPK tablet Take as directed 21 tablet Rodriguez-Southworth, Nettie Elm, PA-C      PDMP not reviewed this encounter.   Garey Ham, [place] 07/08/23 [phone]

## 2023-07-08 NOTE — Telephone Encounter (Signed)
Kim Anthony called back today for the Hydrocodone refill. Patient stated has none on hand. She had to take it for her bad flare up of Gout.   Call back phone [phone]. (Patient has been informed that Dr. Berline Chough is not in the office today).

## 2023-07-08 NOTE — Telephone Encounter (Signed)
Notified patient.

## 2023-07-08 NOTE — ED Triage Notes (Signed)
Patient here today with c/o left foot pain and bilat hand pain that has been worsening since Monday. Patient states that she has a h/o gout.

## 2023-07-15 ENCOUNTER — Other Ambulatory Visit: Payer: Self-pay | Admitting: Student

## 2023-07-15 DIAGNOSIS — J441 Chronic obstructive pulmonary disease with (acute) exacerbation: Secondary | ICD-10-CM

## 2023-07-15 DIAGNOSIS — J449 Chronic obstructive pulmonary disease, unspecified: Secondary | ICD-10-CM

## 2023-07-23 ENCOUNTER — Other Ambulatory Visit: Payer: Self-pay | Admitting: Family Medicine

## 2023-07-23 DIAGNOSIS — M13832 Other specified arthritis, left wrist: Secondary | ICD-10-CM | POA: Diagnosis not present

## 2023-07-23 DIAGNOSIS — M19032 Primary osteoarthritis, left wrist: Secondary | ICD-10-CM | POA: Diagnosis not present

## 2023-07-23 DIAGNOSIS — M109 Gout, unspecified: Secondary | ICD-10-CM | POA: Insufficient documentation

## 2023-07-23 DIAGNOSIS — M13831 Other specified arthritis, right wrist: Secondary | ICD-10-CM | POA: Diagnosis not present

## 2023-07-23 DIAGNOSIS — M19039 Primary osteoarthritis, unspecified wrist: Secondary | ICD-10-CM | POA: Insufficient documentation

## 2023-07-23 DIAGNOSIS — M10031 Idiopathic gout, right wrist: Secondary | ICD-10-CM | POA: Diagnosis not present

## 2023-07-23 HISTORY — DX: Primary osteoarthritis, unspecified wrist: M19.039

## 2023-07-29 ENCOUNTER — Emergency Department (HOSPITAL_COMMUNITY): Payer: 59

## 2023-07-29 ENCOUNTER — Encounter (HOSPITAL_COMMUNITY): Payer: Self-pay | Admitting: Emergency Medicine

## 2023-07-29 ENCOUNTER — Encounter (HOSPITAL_COMMUNITY): Payer: Self-pay

## 2023-07-29 ENCOUNTER — Ambulatory Visit (HOSPITAL_COMMUNITY)
Admission: RE | Admit: 2023-07-29 | Discharge: 2023-07-29 | Disposition: A | Discharge status: Home / Self Care | Payer: 59 | Source: Ambulatory Visit | Attending: Physician Assistant | Admitting: Physician Assistant

## 2023-07-29 ENCOUNTER — Other Ambulatory Visit: Payer: Self-pay

## 2023-07-29 ENCOUNTER — Inpatient Hospital Stay (HOSPITAL_COMMUNITY)
Admission: EM | Admit: 2023-07-29 | Discharge: 2023-08-01 | DRG: 291 | Disposition: A | Discharge status: Home / Self Care | Payer: 59 | Attending: Internal Medicine | Admitting: Internal Medicine

## 2023-07-29 VITALS — BP 126/83 | HR 127 | Temp 97.6°F | Resp 16

## 2023-07-29 DIAGNOSIS — I484 Atypical atrial flutter: Secondary | ICD-10-CM

## 2023-07-29 DIAGNOSIS — I50814 Right heart failure due to left heart failure: Secondary | ICD-10-CM | POA: Diagnosis present

## 2023-07-29 DIAGNOSIS — Z853 Personal history of malignant neoplasm of breast: Secondary | ICD-10-CM

## 2023-07-29 DIAGNOSIS — M109 Gout, unspecified: Secondary | ICD-10-CM

## 2023-07-29 DIAGNOSIS — I509 Heart failure, unspecified: Secondary | ICD-10-CM

## 2023-07-29 DIAGNOSIS — Z952 Presence of prosthetic heart valve: Secondary | ICD-10-CM | POA: Diagnosis not present

## 2023-07-29 DIAGNOSIS — Z801 Family history of malignant neoplasm of trachea, bronchus and lung: Secondary | ICD-10-CM

## 2023-07-29 DIAGNOSIS — I5082 Biventricular heart failure: Secondary | ICD-10-CM | POA: Diagnosis not present

## 2023-07-29 DIAGNOSIS — J9621 Acute and chronic respiratory failure with hypoxia: Secondary | ICD-10-CM | POA: Diagnosis present

## 2023-07-29 DIAGNOSIS — Z7901 Long term (current) use of anticoagulants: Secondary | ICD-10-CM

## 2023-07-29 DIAGNOSIS — B0229 Other postherpetic nervous system involvement: Secondary | ICD-10-CM | POA: Diagnosis not present

## 2023-07-29 DIAGNOSIS — Z888 Allergy status to other drugs, medicaments and biological substances status: Secondary | ICD-10-CM

## 2023-07-29 DIAGNOSIS — Z9012 Acquired absence of left breast and nipple: Secondary | ICD-10-CM

## 2023-07-29 DIAGNOSIS — E876 Hypokalemia: Secondary | ICD-10-CM | POA: Diagnosis present

## 2023-07-29 DIAGNOSIS — R059 Cough, unspecified: Secondary | ICD-10-CM | POA: Diagnosis not present

## 2023-07-29 DIAGNOSIS — Z885 Allergy status to narcotic agent status: Secondary | ICD-10-CM

## 2023-07-29 DIAGNOSIS — N179 Acute kidney failure, unspecified: Secondary | ICD-10-CM | POA: Diagnosis present

## 2023-07-29 DIAGNOSIS — R000 Tachycardia, unspecified: Secondary | ICD-10-CM | POA: Diagnosis not present

## 2023-07-29 DIAGNOSIS — I110 Hypertensive heart disease with heart failure: Secondary | ICD-10-CM | POA: Diagnosis not present

## 2023-07-29 DIAGNOSIS — N183 Chronic kidney disease, stage 3 unspecified: Secondary | ICD-10-CM | POA: Insufficient documentation

## 2023-07-29 DIAGNOSIS — R0902 Hypoxemia: Secondary | ICD-10-CM

## 2023-07-29 DIAGNOSIS — R9431 Abnormal electrocardiogram [ECG] [EKG]: Secondary | ICD-10-CM | POA: Diagnosis not present

## 2023-07-29 DIAGNOSIS — G8929 Other chronic pain: Secondary | ICD-10-CM | POA: Diagnosis not present

## 2023-07-29 DIAGNOSIS — I2510 Atherosclerotic heart disease of native coronary artery without angina pectoris: Secondary | ICD-10-CM | POA: Diagnosis present

## 2023-07-29 DIAGNOSIS — I5043 Acute on chronic combined systolic (congestive) and diastolic (congestive) heart failure: Secondary | ICD-10-CM | POA: Diagnosis present

## 2023-07-29 DIAGNOSIS — N1831 Chronic kidney disease, stage 3a: Secondary | ICD-10-CM | POA: Diagnosis not present

## 2023-07-29 DIAGNOSIS — I4891 Unspecified atrial fibrillation: Secondary | ICD-10-CM | POA: Diagnosis not present

## 2023-07-29 DIAGNOSIS — Z8249 Family history of ischemic heart disease and other diseases of the circulatory system: Secondary | ICD-10-CM | POA: Diagnosis not present

## 2023-07-29 DIAGNOSIS — I130 Hypertensive heart and chronic kidney disease with heart failure and stage 1 through stage 4 chronic kidney disease, or unspecified chronic kidney disease: Principal | ICD-10-CM | POA: Diagnosis present

## 2023-07-29 DIAGNOSIS — Z86718 Personal history of other venous thrombosis and embolism: Secondary | ICD-10-CM

## 2023-07-29 DIAGNOSIS — J9601 Acute respiratory failure with hypoxia: Secondary | ICD-10-CM | POA: Diagnosis present

## 2023-07-29 DIAGNOSIS — F17210 Nicotine dependence, cigarettes, uncomplicated: Secondary | ICD-10-CM | POA: Diagnosis not present

## 2023-07-29 DIAGNOSIS — J449 Chronic obstructive pulmonary disease, unspecified: Secondary | ICD-10-CM | POA: Diagnosis present

## 2023-07-29 DIAGNOSIS — I4892 Unspecified atrial flutter: Secondary | ICD-10-CM | POA: Diagnosis not present

## 2023-07-29 DIAGNOSIS — I480 Paroxysmal atrial fibrillation: Secondary | ICD-10-CM | POA: Diagnosis not present

## 2023-07-29 DIAGNOSIS — J439 Emphysema, unspecified: Secondary | ICD-10-CM | POA: Diagnosis present

## 2023-07-29 DIAGNOSIS — E785 Hyperlipidemia, unspecified: Secondary | ICD-10-CM | POA: Diagnosis present

## 2023-07-29 DIAGNOSIS — Z79899 Other long term (current) drug therapy: Secondary | ICD-10-CM | POA: Diagnosis not present

## 2023-07-29 DIAGNOSIS — R0602 Shortness of breath: Secondary | ICD-10-CM

## 2023-07-29 DIAGNOSIS — D75839 Thrombocytosis, unspecified: Secondary | ICD-10-CM | POA: Diagnosis present

## 2023-07-29 DIAGNOSIS — K219 Gastro-esophageal reflux disease without esophagitis: Secondary | ICD-10-CM | POA: Diagnosis not present

## 2023-07-29 DIAGNOSIS — I5042 Chronic combined systolic (congestive) and diastolic (congestive) heart failure: Secondary | ICD-10-CM

## 2023-07-29 DIAGNOSIS — I50810 Right heart failure, unspecified: Secondary | ICD-10-CM | POA: Diagnosis not present

## 2023-07-29 DIAGNOSIS — J9610 Chronic respiratory failure, unspecified whether with hypoxia or hypercapnia: Secondary | ICD-10-CM | POA: Diagnosis present

## 2023-07-29 DIAGNOSIS — Z86711 Personal history of pulmonary embolism: Secondary | ICD-10-CM

## 2023-07-29 DIAGNOSIS — I517 Cardiomegaly: Secondary | ICD-10-CM | POA: Diagnosis not present

## 2023-07-29 DIAGNOSIS — Z91048 Other nonmedicinal substance allergy status: Secondary | ICD-10-CM

## 2023-07-29 DIAGNOSIS — Z803 Family history of malignant neoplasm of breast: Secondary | ICD-10-CM

## 2023-07-29 DIAGNOSIS — I5040 Unspecified combined systolic (congestive) and diastolic (congestive) heart failure: Secondary | ICD-10-CM

## 2023-07-29 LAB — CBC
HCT: 36.1 % (ref 36.0–46.0)
Hemoglobin: 11.1 g/dL — ABNORMAL LOW (ref 12.0–15.0)
MCH: 28.8 pg (ref 26.0–34.0)
MCHC: 30.7 g/dL (ref 30.0–36.0)
MCV: 93.8 fL (ref 80.0–100.0)
Platelets: 479 10*3/uL — ABNORMAL HIGH (ref 150–400)
RBC: 3.85 MIL/uL — ABNORMAL LOW (ref 3.87–5.11)
RDW: 15 % (ref 11.5–15.5)
WBC: 11.1 10*3/uL — ABNORMAL HIGH (ref 4.0–10.5)
nRBC: 0.5 % — ABNORMAL HIGH (ref 0.0–0.2)

## 2023-07-29 LAB — PROTIME-INR
INR: 2.9 — ABNORMAL HIGH (ref 0.8–1.2)
Prothrombin Time: 30.8 s — ABNORMAL HIGH (ref 11.4–15.2)

## 2023-07-29 LAB — BRAIN NATRIURETIC PEPTIDE: B Natriuretic Peptide: 374.8 pg/mL — ABNORMAL HIGH (ref 0.0–100.0)

## 2023-07-29 LAB — BASIC METABOLIC PANEL
Anion gap: 13 (ref 5–15)
BUN: 38 mg/dL — ABNORMAL HIGH (ref 8–23)
CO2: 20 mmol/L — ABNORMAL LOW (ref 22–32)
Calcium: 8.1 mg/dL — ABNORMAL LOW (ref 8.9–10.3)
Chloride: 103 mmol/L (ref 98–111)
Creatinine, Ser: 1.41 mg/dL — ABNORMAL HIGH (ref 0.44–1.00)
GFR, Estimated: 39 mL/min — ABNORMAL LOW (ref >60)
Glucose, Bld: 78 mg/dL (ref 70–99)
Potassium: 3.4 mmol/L — ABNORMAL LOW (ref 3.5–5.1)
Sodium: 136 mmol/L (ref 135–145)

## 2023-07-29 LAB — MAGNESIUM: Magnesium: 1.8 mg/dL (ref 1.7–2.4)

## 2023-07-29 MED ORDER — FLUTICASONE FUROATE-VILANTEROL 100-25 MCG/ACT IN AEPB
1.0000 | INHALATION_SPRAY | Freq: Every day | RESPIRATORY_TRACT | Status: DC
  Administered 2023-07-30 – 2023-08-01 (×3): 1 via RESPIRATORY_TRACT
  Filled 2023-07-29: qty 28

## 2023-07-29 MED ORDER — HYDROCODONE-ACETAMINOPHEN 5-325 MG PO TABS
1.0000 | ORAL_TABLET | Freq: Four times a day (QID) | ORAL | Status: DC | PRN
  Administered 2023-07-29 – 2023-08-01 (×7): 1 via ORAL
  Filled 2023-07-29 (×7): qty 1

## 2023-07-29 MED ORDER — DAPAGLIFLOZIN PROPANEDIOL 10 MG PO TABS: 10.0000 mg | ORAL_TABLET | Freq: Every day | ORAL | Status: DC

## 2023-07-29 MED ORDER — DILTIAZEM LOAD VIA INFUSION
10.0000 mg | Freq: Once | INTRAVENOUS | Status: AC
  Administered 2023-07-29: 10 mg via INTRAVENOUS
  Filled 2023-07-29: qty 10

## 2023-07-29 MED ORDER — FUROSEMIDE 10 MG/ML IJ SOLN: 40.0000 mg | Freq: Once | INTRAMUSCULAR | Status: DC

## 2023-07-29 MED ORDER — FUROSEMIDE 10 MG/ML IJ SOLN
40.0000 mg | Freq: Once | INTRAMUSCULAR | Status: AC
  Administered 2023-07-29: 40 mg via INTRAVENOUS
  Filled 2023-07-29: qty 4

## 2023-07-29 MED ORDER — NICOTINE 14 MG/24HR TD PT24
14.0000 mg | MEDICATED_PATCH | Freq: Every day | TRANSDERMAL | Status: DC
  Administered 2023-07-29 – 2023-08-01 (×4): 14 mg via TRANSDERMAL
  Filled 2023-07-29 (×4): qty 1

## 2023-07-29 MED ORDER — PANTOPRAZOLE SODIUM 40 MG PO TBEC
40.0000 mg | DELAYED_RELEASE_TABLET | Freq: Every day | ORAL | Status: DC
  Administered 2023-07-30 – 2023-08-01 (×3): 40 mg via ORAL
  Filled 2023-07-29 (×3): qty 1

## 2023-07-29 MED ORDER — LOSARTAN POTASSIUM 50 MG PO TABS: 25.0000 mg | ORAL_TABLET | Freq: Every day | ORAL | Status: DC

## 2023-07-29 MED ORDER — GABAPENTIN 400 MG PO CAPS
800.0000 mg | ORAL_CAPSULE | Freq: Three times a day (TID) | ORAL | Status: DC
  Administered 2023-07-29: 800 mg via ORAL
  Filled 2023-07-29: qty 8

## 2023-07-29 MED ORDER — DILTIAZEM HCL-DEXTROSE 125-5 MG/125ML-% IV SOLN (PREMIX)
5.0000 mg/h | INTRAVENOUS | Status: DC
  Administered 2023-07-29: 5 mg/h via INTRAVENOUS
  Filled 2023-07-29 (×3): qty 125

## 2023-07-29 MED ORDER — SIMVASTATIN 20 MG PO TABS
20.0000 mg | ORAL_TABLET | Freq: Every day | ORAL | Status: DC
  Administered 2023-07-30 – 2023-08-01 (×3): 20 mg via ORAL
  Filled 2023-07-29 (×3): qty 1

## 2023-07-29 MED ORDER — POTASSIUM CHLORIDE CRYS ER 20 MEQ PO TBCR
40.0000 meq | EXTENDED_RELEASE_TABLET | Freq: Once | ORAL | Status: AC
  Administered 2023-07-29: 40 meq via ORAL
  Filled 2023-07-29: qty 2

## 2023-07-29 MED ORDER — WARFARIN SODIUM 5 MG PO TABS: 5.0000 mg | ORAL_TABLET | Freq: Every day | ORAL | Status: DC

## 2023-07-29 MED ORDER — FLUTICASONE PROPIONATE 50 MCG/ACT NA SUSP
2.0000 | Freq: Every day | NASAL | Status: DC
  Administered 2023-07-30 – 2023-08-01 (×3): 2 via NASAL
  Filled 2023-07-29: qty 16

## 2023-07-29 MED ORDER — WARFARIN - PHARMACIST DOSING INPATIENT: Freq: Every day | Status: DC

## 2023-07-29 MED ORDER — METOPROLOL SUCCINATE ER 25 MG PO TB24: 25.0000 mg | ORAL_TABLET | Freq: Every day | ORAL | Status: DC

## 2023-07-29 MED ORDER — UMECLIDINIUM BROMIDE 62.5 MCG/ACT IN AEPB
1.0000 | INHALATION_SPRAY | Freq: Every day | RESPIRATORY_TRACT | Status: DC
  Administered 2023-07-30 – 2023-08-01 (×3): 1 via RESPIRATORY_TRACT
  Filled 2023-07-29: qty 7

## 2023-07-29 NOTE — ED Notes (Signed)
 Phlebotomy to collect CBC.

## 2023-07-29 NOTE — ED Notes (Addendum)
 Awaiting cardizem  from main pharmacy as ED completely out.  Pharmacy in ED notified and aware.  Savannah Salam to bring to this RN when available.

## 2023-07-29 NOTE — ED Provider Notes (Signed)
 [facility]    CSN: [id number] Arrival date & time: 07/29/23  1054      History   Chief Complaint Chief Complaint  Patient presents with   Facial Swelling   Ear Fullness    HPI MARSHAWN NINNEMAN is a 73 y.o. female.   Patient presents today with a weeklong history of pressure and swelling around her left eye.  She reports pain and pressure in her left ear as well.  The discomfort is rated 4/5 on a 0-10 pain scale, described as pressure, no aggravating leaving factors identified.  She does report associated cough and congestion.  Denies any fever, ocular pain, visual disturbance, pain with extraocular movements.  She has not tried any over-the-counter medication for symptom management. She does have a history of COPD and has noticed an increase in her cough but denies any recent steroids or antibiotic use.  She reports compliance with her maintenance medication (Trelegy Ellipta ).  She is chronically anticoagulated due to history of mechanical heart valve replacement with warfarin.  She does have a history of seasonal allergies but has not been taking any allergy medication because this previously been ineffective.    Past Medical History:  Diagnosis Date   Arthritis    Asthma    Breast cancer (HCC) 2001   Left Breast Cancer   CHF (congestive heart failure) (HCC)    COPD (chronic obstructive pulmonary disease) (HCC)    Coronary artery disease    GERD (gastroesophageal reflux disease)    History of mitral valve replacement with mechanical valve    HLD (hyperlipidemia)    Hypertension    Pulmonary embolism (HCC) 2021    Patient Active Problem List   Diagnosis Date Noted   Bilateral hearing loss 12/01/2022   Hypokalemia 11/18/2022   Thrombocytosis 10/16/2022   Hyperkalemia 10/08/2022   AKI (acute kidney injury) (HCC) 10/07/2022   Alteration in anticoagulation 09/25/2022   History of mitral valve replacement with mechanical valve 09/24/2022   Anticoagulated 09/24/2022    Closed fracture of right hip (HCC) 09/23/2022   Closed intertrochanteric fracture of right femur, initial encounter (HCC) 09/22/2022   H/O mitral valve replacement with mechanical valve 09/22/2022   Chronic HFrEF (heart failure with reduced ejection fraction) (HCC) 09/22/2022   Endometrial thickening on ultrasound 07/07/2022   Gastroesophageal reflux disease without esophagitis 06/29/2022   Chronic pain of both knees 06/29/2022   Vaginal bleeding 04/01/2022   A-fib (HCC) 03/13/2022   Facial edema 03/07/2022   Atypical atrial flutter (HCC)    Combined systolic and diastolic heart failure (HCC) 02/19/2022   CHF (congestive heart failure) (HCC) 02/19/2022   Combined systolic and diastolic congestive heart failure (HCC) 02/18/2022   Acute pulmonary edema (HCC)    Edema of both lower extremities 01/13/2022   Hypotension 01/13/2022   Subacute cough 11/28/2021   COPD exacerbation (HCC) 11/05/2021   Conjunctival hemorrhage of left eye 06/25/2021   Osteoarthritis of left knee 04/24/2021   Seronegative inflammatory arthritis 02/17/2021   High risk medication use 02/17/2021   Elevated transaminase level 01/27/2021   Acute idiopathic gout of multiple sites 09/30/2020   Chronic pain syndrome 01/17/2020   Monoarthritis of wrist 10/18/2019   Ganglion cyst 09/21/2019   Hip pain 08/23/2019   Thoracic ascending aortic aneurysm (HCC) 07/05/2019   Macrocytic anemia 02/16/2019   Breast cancer (HCC)    Acquired absence of left breast 12/20/2018   COPD (chronic obstructive pulmonary disease) (HCC) 11/25/2017   S/P MVR (mitral valve replacement)  Chronic anticoagulation    Tobacco use disorder 02/25/2017   Lymphadenopathy, mediastinal 02/25/2017   Essential hypertension 02/01/2016   Allergic rhinitis 02/01/2016   Healthcare maintenance 01/30/2016   Chronic low back pain 07/22/2015   Pulmonary embolism (HCC) 07/11/2015   Chronic combined systolic and diastolic heart failure (HCC) 07/11/2015    Headache, common migraine 07/11/2015   HLD (hyperlipidemia) 07/11/2015    Past Surgical History:  Procedure Laterality Date   APPLICATION OF A-CELL OF CHEST/ABDOMEN Left 01/27/2019   Procedure: APPLICATION OF A-CELL OF CHEST;  Surgeon: Lowery Estefana RAMAN, DO;  Location: [facility] OR;  Service: Plastics;  Laterality: Left;   APPLICATION OF WOUND VAC N/A 01/27/2019   Procedure: APPLICATION OF WOUND VAC;  Surgeon: Lowery Estefana RAMAN, DO;  Location: [facility] OR;  Service: Plastics;  Laterality: N/A;   BREAST SURGERY Left 2001   for CA   CARDIOVERSION N/A 02/26/2022   Procedure: CARDIOVERSION;  Surgeon: Kate Lonni CROME, MD;  Location: [facility] ENDOSCOPY;  Service: Cardiovascular;  Laterality: N/A;   COLONOSCOPY WITH PROPOFOL  N/A 06/07/2018   Procedure: COLONOSCOPY WITH PROPOFOL ;  Surgeon: Dianna Specking, MD;  Location: [facility] ENDOSCOPY;  Service: Endoscopy;  Laterality: N/A;   DEBRIDEMENT AND CLOSURE WOUND Left 12/28/2018   Procedure: Debridement And Closure Wound;  Surgeon: Vernetta Berg, MD;  Location: [facility] OR;  Service: General;  Laterality: Left;   INCISION AND DRAINAGE OF WOUND Left 01/27/2019   Procedure: IRRIGATION AND DEBRIDEMENT OF CHEST WALL;  Surgeon: Lowery Estefana RAMAN, DO;  Location: [facility] OR;  Service: Plastics;  Laterality: Left;   INTRAMEDULLARY (IM) NAIL INTERTROCHANTERIC N/A 09/23/2022   Procedure: INTRAMEDULLARY (IM) NAIL INTERTROCHANTERIC;  Surgeon: Jerri Kay HERO, MD;  Location: [facility] OR;  Service: Orthopedics;  Laterality: N/A;   KNEE ARTHROSCOPY WITH MEDIAL MENISECTOMY Left 08/06/2021   Procedure: KNEE ARTHROSCOPY WITH PARTIAL MEDIAL MENISECTOMY, DEBRIDEMENT;  Surgeon: Duwayne Purchase, MD;  Location: [facility] ORS;  Service: Orthopedics;  Laterality: Left;  1 HR   LATISSIMUS FLAP TO BREAST Left 01/18/2019   Procedure: LEFT LATISSIMUS FLAP TO BREAST;  Surgeon: Lowery Estefana RAMAN, DO;  Location: [facility] OR;  Service: Plastics;  Laterality: Left;   MASTECTOMY Left    MITRAL VALVE REPLACEMENT      mechanical   POLYPECTOMY  06/07/2018   Procedure: POLYPECTOMY;  Surgeon: Dianna Specking, MD;  Location: [facility] ENDOSCOPY;  Service: Endoscopy;;   RIGHT/LEFT HEART CATH AND CORONARY ANGIOGRAPHY N/A 01/09/2021   Procedure: RIGHT/LEFT HEART CATH AND CORONARY ANGIOGRAPHY;  Surgeon: Cherrie Toribio SAUNDERS, MD;  Location: [facility];  Service: Cardiovascular;  Laterality: N/A;   WOUND DEBRIDEMENT Left 12/28/2018   Procedure: EXCISION OF LEFT CHRONIC CHEST WALL WOUND;  Surgeon: Vernetta Berg, MD;  Location: [facility] OR;  Service: General;  Laterality: Left;    OB History     Gravida  2   Para  2   Term  2   Preterm      AB      Living         SAB      IAB      Ectopic      Multiple      Live Births  2            Home Medications    Prior to Admission medications   Medication Sig Start Date End Date Taking? Authorizing Provider  albuterol  (VENTOLIN  HFA) 108 (90 Base) MCG/ACT inhaler INHALE 2 PUFFS BY MOUTH EVERY 6 HOURS AS NEEDED FOR WHEEZING FOR SHORTNESS OF BREATH  07/15/23   Amoako, Prince, MD  colchicine  0.6 MG tablet Take 1 tablet (0.6 mg total) by mouth daily. 07/08/23   Rodriguez-Southworth, Sylvia, PA-C  DULoxetine  (CYMBALTA ) 20 MG capsule Take 20 mg by mouth at bedtime. As needed    [provider]  FARXIGA  10 MG TABS tablet Take 1 tablet by mouth once daily 06/03/23   Amoako, Prince, MD  fluticasone  (FLONASE ) 50 MCG/ACT nasal spray Place 2 sprays into both nostrils daily. 05/17/23 05/16/24  Renne Homans, MD  gabapentin  (NEURONTIN ) 800 MG tablet Take 1 tablet (800 mg total) by mouth 3 (three) times daily. 03/01/23   Lovorn, Megan, MD  HYDROcodone -acetaminophen  (NORCO/VICODIN) 5-325 MG tablet Take 1 tablet by mouth every 4 (four) hours as needed for moderate pain (pain score 4-6). 07/08/23   Lovorn, Megan, MD  losartan  (COZAAR ) 25 MG tablet Take 1/2 (one-half) tablet by mouth once daily 02/26/23   Bensimhon, Toribio SAUNDERS, MD  methylPREDNISolone  (MEDROL  DOSEPAK) 4  MG TBPK tablet Take as directed 07/08/23   Rodriguez-Southworth, Kyra, PA-C  metoprolol  succinate (TOPROL -XL) 25 MG 24 hr tablet Take 1 tablet (25 mg total) by mouth daily. 12/03/22   Sharl Gee, MD  pantoprazole  (PROTONIX ) 40 MG tablet Take 1 tablet (40 mg total) by mouth daily. 04/16/23   Arellano Zameza, Priscila, MD  potassium chloride  (KLOR-CON  M) 10 MEQ tablet Take 4 tablets (40 mEq total) by mouth daily. 01/19/23 08/17/23  Glena Harlene HERO, FNP  simvastatin  (ZOCOR ) 20 MG tablet Take 1 tablet (20 mg total) by mouth daily. 12/03/22   Sharl Gee, MD  torsemide  (DEMADEX ) 20 MG tablet Take 3 tablets (60 mg total) by mouth daily. 01/21/23   Glena Harlene HERO, FNP  TRELEGY ELLIPTA  100-62.5-25 MCG/ACT AEPB INHALE 1 PUFF ONCE DAILY INTO  THE  LUNGS 07/15/23   Amoako, Prince, MD  warfarin (COUMADIN ) 5 MG tablet Take one-and-one-half (1 & 1/2) of your 5 mg peach colored warfarin tablets on Mondays and Thursdays. All other days, take only one (1) tablet. 04/26/23   Ruthell Lynwood NOVAK, RPH-CPP    Family History Family History  Problem Relation Age of Onset   Hypertension Mother    Cancer Father    Hypertension Father    Breast cancer Maternal Aunt 50   Heart attack Other     Social History Social History   Tobacco Use   Smoking status: Every Day    Current packs/day: 0.10    Average packs/day: 0.1 packs/day for 30.0 years (3.0 ttl pk-yrs)    Types: Cigarettes   Smokeless tobacco: Never   Tobacco comments:    5 cigs per day  Vaping Use   Vaping status: Never Used  Substance Use Topics   Alcohol  use: Yes    Alcohol /week: 3.0 standard drinks of alcohol     Types: 3 Standard drinks or equivalent per week    Comment: beer- ocasional   Drug use: No     Allergies   Lisinopril , Acetaminophen -codeine, Propoxyphene, Tape, and Tramadol    Review of Systems Review of Systems  Constitutional:  Positive for activity change. Negative for appetite change, fatigue and fever.  HENT:  Positive  for congestion, sinus pressure and sinus pain. Negative for sneezing and sore throat.   Respiratory:  Positive for cough and shortness of breath.   Cardiovascular:  Positive for leg swelling. Negative for chest pain and palpitations.  Gastrointestinal:  Negative for abdominal pain, diarrhea, nausea and vomiting.  Neurological:  Positive for headaches. Negative for dizziness and light-headedness.  Physical Exam Triage Vital Signs ED Triage Vitals  Encounter Vitals Group     BP 07/29/23 1111 126/83     Systolic BP Percentile --      Diastolic BP Percentile --      Pulse Rate 07/29/23 1111 (!) 127     Resp 07/29/23 1111 16     Temp 07/29/23 1111 97.6 F (36.4 C)     Temp Source 07/29/23 1111 Oral     SpO2 07/29/23 1111 95 %     Weight --      Height --      Head Circumference --      Peak Flow --      Pain Score 07/29/23 1110 0     Pain Loc --      Pain Education --      Exclude from Growth Chart --    No data found.  Updated Vital Signs BP 126/83 (BP Location: Right Arm)   Pulse (!) 127   Temp 97.6 F (36.4 C) (Oral)   Resp 16   SpO2 95%   Visual Acuity Right Eye Distance:   Left Eye Distance:   Bilateral Distance:    Right Eye Near:   Left Eye Near:    Bilateral Near:     Physical Exam Vitals reviewed.  Constitutional:      General: She is awake. She is not in acute distress.    Appearance: Normal appearance. She is well-developed. She is not ill-appearing.     Comments: Very pleasant female appears stated age in no acute distress sitting comfortably in exam room  HENT:     Head: Normocephalic and atraumatic.     Right Ear: Tympanic membrane, ear canal and external ear normal. Tympanic membrane is not erythematous or bulging.     Left Ear: Tympanic membrane, ear canal and external ear normal. Tympanic membrane is not erythematous or bulging.     Nose:     Right Sinus: No maxillary sinus tenderness or frontal sinus tenderness.     Left Sinus: Maxillary  sinus tenderness and frontal sinus tenderness present.     Mouth/Throat:     Pharynx: Uvula midline. No oropharyngeal exudate or posterior oropharyngeal erythema.  Eyes:     Extraocular Movements: Extraocular movements intact.     Conjunctiva/sclera: Conjunctivae normal.     Comments: No pain with extraocular movement.  Cardiovascular:     Rate and Rhythm: Regular rhythm. Tachycardia present.     Heart sounds: Normal heart sounds, S1 normal and S2 normal. No murmur heard.    Comments: Click of mechanical valve heard on auscultation. Pulmonary:     Effort: Pulmonary effort is normal.     Breath sounds: Normal breath sounds. No wheezing, rhonchi or rales.  Psychiatric:        Behavior: Behavior is cooperative.      UC Treatments / Results  Labs (all labs ordered are listed, but only abnormal results are displayed) Labs Reviewed - No data to display  EKG   Radiology No results found.  Procedures Procedures (including critical care time)  Medications Ordered in UC Medications - No data to display  Initial Impression / Assessment and Plan / UC Course  I have reviewed the triage vital signs and the nursing notes.  Pertinent labs & imaging results that were available during my care of the patient were reviewed by me and considered in my medical decision making (see chart for details).     Patient was  noted to be tachycardic on intake but is otherwise well-appearing, nontoxic, afebrile.  EKG was obtained that showed prolonged QT with QTc of 603 ms which is increased from her previous tracing on 12/31/2022 with a QTc of 495.  She is not currently in torsades based on EKG but does not have appreciable P waves concerning for A-fib with RVR.  She was noted to be tachycardic and we discussed that given her tachycardia and abnormal EKG results I recommend she go to the emergency room for stat labs and monitoring that we do not have access to an urgent care.  She was agreeable.  She drove  herself to urgent care and did not have anyone who can take her so [facility] was contacted to transport her.  She was stable at the time of discharge.  Final Clinical Impressions(s) / UC Diagnoses   Final diagnoses:  Prolonged Q-T interval on ECG  Tachycardia  SOB (shortness of breath)   Discharge Instructions   None    ED Prescriptions   None    PDMP not reviewed this encounter.   Sherrell Rocky POUR, PA-C 07/29/23 1220

## 2023-07-29 NOTE — ED Provider Notes (Signed)
  EMERGENCY DEPARTMENT AT [facility] Provider Note   CSN: [id number] Arrival date & time: 07/29/23  1243     History  Chief Complaint  Patient presents with   Tachycardia    Kim Anthony is a 73 y.o. female.  HPI   Pt states she has noticed swelling about both of her eyes as well as her feet.  Sx ongoing for a week.  Pt has been a little short of breath.  No fevers or chills.  SOme cough.  Pt has history of irregular heart rhythm in the past.  She said she had to have her heart shocked.  She is on anticoagulation.  She didn't take her dose today.  Home Medications Prior to Admission medications   Medication Sig Start Date End Date Taking? Authorizing Provider  albuterol  (VENTOLIN  HFA) 108 (90 Base) MCG/ACT inhaler INHALE 2 PUFFS BY MOUTH EVERY 6 HOURS AS NEEDED FOR WHEEZING FOR SHORTNESS OF BREATH 07/15/23   Amoako, Prince, MD  colchicine  0.6 MG tablet Take 1 tablet (0.6 mg total) by mouth daily. 07/08/23   Rodriguez-Southworth, Sylvia, PA-C  DULoxetine  (CYMBALTA ) 20 MG capsule Take 20 mg by mouth at bedtime. As needed    [provider]  FARXIGA  10 MG TABS tablet Take 1 tablet by mouth once daily 06/03/23   Amoako, Prince, MD  fluticasone  (FLONASE ) 50 MCG/ACT nasal spray Place 2 sprays into both nostrils daily. 05/17/23 05/16/24  Amoako, Prince, MD  gabapentin  (NEURONTIN ) 800 MG tablet Take 1 tablet (800 mg total) by mouth 3 (three) times daily. 03/01/23   Lovorn, Megan, MD  HYDROcodone -acetaminophen  (NORCO/VICODIN) 5-325 MG tablet Take 1 tablet by mouth every 4 (four) hours as needed for moderate pain (pain score 4-6). 07/08/23   Lovorn, Megan, MD  losartan  (COZAAR ) 25 MG tablet Take 1/2 (one-half) tablet by mouth once daily 02/26/23   Bensimhon, Toribio SAUNDERS, MD  methylPREDNISolone  (MEDROL  DOSEPAK) 4 MG TBPK tablet Take as directed 07/08/23   Rodriguez-Southworth, Kyra, PA-C  metoprolol  succinate (TOPROL -XL) 25 MG 24 hr tablet Take 1 tablet (25 mg total) by  mouth daily. 12/03/22   Sharl Gee, MD  pantoprazole  (PROTONIX ) 40 MG tablet Take 1 tablet (40 mg total) by mouth daily. 04/16/23   Arellano Zameza, Priscila, MD  potassium chloride  (KLOR-CON  M) 10 MEQ tablet Take 4 tablets (40 mEq total) by mouth daily. 01/19/23 08/17/23  Glena Harlene HERO, FNP  simvastatin  (ZOCOR ) 20 MG tablet Take 1 tablet (20 mg total) by mouth daily. 12/03/22   Sharl Gee, MD  torsemide  (DEMADEX ) 20 MG tablet Take 3 tablets (60 mg total) by mouth daily. 01/21/23   Glena Harlene HERO, FNP  TRELEGY ELLIPTA  100-62.5-25 MCG/ACT AEPB INHALE 1 PUFF ONCE DAILY INTO  THE  LUNGS 07/15/23   Amoako, Prince, MD  warfarin (COUMADIN ) 5 MG tablet Take one-and-one-half (1 & 1/2) of your 5 mg peach colored warfarin tablets on Mondays and Thursdays. All other days, take only one (1) tablet. 04/26/23   Ruthell Lynwood KATHEE, RPH-CPP      Allergies    Lisinopril , Acetaminophen -codeine, Propoxyphene, Tape, and Tramadol     Review of Systems   Review of Systems  Physical Exam Updated Vital Signs BP 109/67 (BP Location: Right Arm)   Pulse (!) 117   Temp 98.2 F (36.8 C) (Oral)   Resp (!) 22   Ht 1.651 m (5' 5)   Wt 68 kg   SpO2 98%   BMI 24.96 kg/m  Physical Exam Vitals and nursing  note reviewed.  Constitutional:      General: She is not in acute distress.    Appearance: She is well-developed.  HENT:     Head: Normocephalic and atraumatic.     Right Ear: External ear normal.     Left Ear: External ear normal.  Eyes:     General: No scleral icterus.       Right eye: No discharge.        Left eye: No discharge.     Conjunctiva/sclera: Conjunctivae normal.     Comments: Periorbital edema  Neck:     Trachea: No tracheal deviation.  Cardiovascular:     Rate and Rhythm: Regular rhythm. Tachycardia present.  Pulmonary:     Effort: Pulmonary effort is normal. No respiratory distress.     Breath sounds: No stridor. Rales present. No wheezing.  Abdominal:     General: Bowel sounds are  normal. There is no distension.     Palpations: Abdomen is soft.     Tenderness: There is no abdominal tenderness. There is no guarding or rebound.  Musculoskeletal:        General: No tenderness or deformity.     Cervical back: Neck supple.     Right lower leg: Edema present.     Left lower leg: Edema present.  Skin:    General: Skin is warm and dry.     Findings: No rash.  Neurological:     General: No focal deficit present.     Mental Status: She is alert.     Cranial Nerves: No cranial nerve deficit, dysarthria or facial asymmetry.     Sensory: No sensory deficit.     Motor: No abnormal muscle tone or seizure activity.     Coordination: Coordination normal.  Psychiatric:        Mood and Affect: Mood normal.     ED Results / Procedures / Treatments   Labs (all labs ordered are listed, but only abnormal results are displayed) Labs Reviewed - No data to display  EKG EKG Interpretation Date/Time:  Thursday July 29 2023 12:49:02 EST Ventricular Rate:  125 PR Interval:  79 QRS Duration:  135 QT Interval:  399 QTC Calculation: 576 R Axis:   -25  Text Interpretation: Ectopic atrial tachycardia, unifocal Right bundle branch block Left ventricular hypertrophy Borderline ST elevation, lateral leads Prolonged QT interval No significant change since last tracing earlier today Confirmed by Randol Simmonds [phone]) on 07/29/2023 1:05:15 PM  Radiology DG HIP PORT UNILAT WITH PELVIS 1V LEFT Result Date: 07/30/2023 CLINICAL DATA:  Left hip pain EXAM: DG HIP (WITH OR WITHOUT PELVIS) 1V PORT LEFT COMPARISON:  None Available. FINDINGS: Moderate left hip osteoarthrosis with narrowing of the joint space. No fracture or dislocation. Chronic displacement of the right lesser trochanter. Right femoral intramedullary nail. IMPRESSION: Moderate left hip osteoarthrosis. Electronically Signed   By: Franky Stanford M.D.   On: 07/30/2023 04:03   DG Chest Portable 1 View Result Date: 07/29/2023 CLINICAL DATA:   Shortness of breath and cough. EXAM: PORTABLE CHEST 1 VIEW COMPARISON:  Chest radiograph dated 11/17/2022. FINDINGS: There is shallow inspiration. Background of emphysema and interstitial coarsening. An area of airspace opacity in the left mid lung field likely corresponding to the subpleural scarring seen on CT. No new consolidation. There is no pleural effusion or pneumothorax. There is cardiomegaly. Median sternotomy wires. No acute osseous pathology. IMPRESSION: 1. No acute cardiopulmonary process. 2. Cardiomegaly. Electronically Signed   By: Vanetta Shelia HERO.D.  On: 07/29/2023 14:34    Procedures .Critical Care  Performed by: Randol Simmonds, MD Authorized by: Randol Simmonds, MD   Critical care provider statement:    Critical care time (minutes):  30   Critical care was time spent personally by me on the following activities:  Development of treatment plan with patient or surrogate, discussions with consultants, evaluation of patient's response to treatment, examination of patient, ordering and review of laboratory studies, ordering and review of radiographic studies, ordering and performing treatments and interventions, pulse oximetry, re-evaluation of patient's condition and review of old charts     Medications Ordered in ED Medications - No data to display  ED Course/ Medical Decision Making/ A&P Clinical Course as of 07/30/23 0635  Thu Jul 29, 2023  1441 Chest x-ray does not show any acute abnormality.  Cardiomegaly noted [JK]  1501 INR in the therapeutic range at 2.9 [JK]  2054 Admitted to IM [MT]    Clinical Course User Index [JK] Randol Simmonds, MD [MT] Cottie Donnice PARAS, MD                                 Medical Decision Making Problems Addressed: Atrial fibrillation with RVR [facility]): acute illness or injury that poses a threat to life or bodily functions Congestive heart failure, unspecified HF chronicity, unspecified heart failure type [facility]): acute illness or injury that poses a  threat to life or bodily functions  Amount and/or Complexity of Data Reviewed Labs: ordered. Decision-making details documented in ED Course. Radiology: ordered and independent interpretation performed.  Risk Prescription drug management. Decision regarding hospitalization.   Pt presented with complaints of facial swelling.  Noted to be tachycardic at urgent care.  Mild facial edema noted.  Pt with also lower extremity edema.  Doubt infection, allergic reaction.  Suspect volume overload.  Tachycardia likely related to paroxysmal atrial flutter.  History of same.  Pt appropriately anticoagulated.  Low suspicion for PE.    Pt started on rate control cardizem .  IV lasix .  Labs pending at shift change.  Plan on admission to hospital for further treatment.        Final Clinical Impression(s) / ED Diagnoses Final diagnoses:  Atrial fibrillation with RVR (HCC)  Hypoxia  Congestive heart failure, unspecified HF chronicity, unspecified heart failure type [facility])    Rx / DC Orders ED Discharge Orders     None         Randol Simmonds, MD 07/30/23 [phone]

## 2023-07-29 NOTE — ED Triage Notes (Signed)
 Pt presents with c/o lt eye swelling X 1 wk. Pt states she cannot ear well from the lt ear.

## 2023-07-29 NOTE — ED Notes (Signed)
 Second PT/INR sent down to main lab.  Kim in main lab notified and aware.

## 2023-07-29 NOTE — Hospital Course (Addendum)
 Afib w/ RVR + CHF Exacerbation. Moderate level of CHF EF of 40%. Came into ED with worsening tachycardia and dyspnea on exertion. Found to be in AFIB with RVR, HR in 120, but now less than 100 with Dilt drip. Coumadin  for mechanical valve and PE   Eyes were swollen for a week, went to her pharmacist, and they told her to come to the ED> Left side of the eye, kept rubbing it, some draining come out of it yellow color. Top part of the face was swollen. Swelling got worse, then got better on it's own. Had a lot of congestion, and had her glands were swollen, went to urgent care today. Got some eye drops over the counter which seemed to have helepd.   Coughing as well, sometimes it's clear it's about the same. Denies subjective fevers. Some shortness of breath when walking. Sleeps with a lot of pillows. No sob at night. No sick contacts. No nausea, vomiting, no stomach pain, appetite is pretty baseline. Urinating ok. No weakness of legs.   Positive for swelling on her legs, started last week and keeps going and coming. Something new. Positive for chest flutters. No pleuritic chest pain or pain in her lower extremity calves.  Left hip pain, feels like shignles pain. Pain has stayed the same for the last 6 months.   Medications:  Albuterol  inhaler  Colchicine  .6mg   Cymbalta  20mg   Farxiga  10mg   Gabapentin  800mg  TID Norco 5-325mg  Losartan  25mg   Metoprolol  25mg   Protonix  40mg   Prednsione 10mg   Simvastatin  20mg   Torsemide  20mg   Trelegy  Warfarin 7.5mg  on Monday and Thursday and then 5mg  - daughter checks her INR at home   Social History  Lives with Daughter in [place]  Independent in all ADLs/IADLs Sometimes uses her cane because she broke her hip :( right hip  4-5 cigarrettes a day. Been smoking for 29 years. Dirnks one to two beers a day. No hx of withdrawal     Father had bone cancer  Aunt had breast cancer and lung cancer   2/9 -on room air  -doing well this morning -ambulated   -no SOB -mild cough still

## 2023-07-29 NOTE — ED Notes (Addendum)
 Patient is being discharged from the Urgent Care and sent to the Emergency Department via [facility] . Per Rocky Gilford PA , patient is in need of higher level of care due to tachycardia/abnormal EKG. Patient is aware and verbalizes understanding of plan of care.  Vitals:   07/29/23 1111  BP: 126/83  Pulse: (!) 127  Resp: 16  Temp: 97.6 F (36.4 C)  SpO2: 95%

## 2023-07-29 NOTE — ED Provider Notes (Signed)
 73 yo female w/ hx of possible paroxysmal A flutter presenting with tachycardia - potential A Flutter pattern.  Started on diltiazem  infusion and clinicall has CHF  Pt is on coumadin , INR 2.9, hx of mechanical heart valve  Plan for rate control, and admission  IV lasix  40 mg ordered, oral K repletion   *  Patient reassessed on the labs have returned and appear to be relatively stable.  BNP mildly elevated.  She remains on 2 L supplemental oxygen  nasal cannula for hypoxia, with leg swelling and shortness of breath and dyspnea consistent with CHF exacerbation.  Heart rate is improved on diltiazem  infusion down to 100 bpm.  Plan to admit to the patient for diuresis, continued rate control.  Pt in agreement with admission plan.  Home pain medication ordered.  .Critical Care  Performed by: Cottie Donnice PARAS, MD Authorized by: Cottie Donnice PARAS, MD   Critical care provider statement:    Critical care time (minutes):  30   Critical care time was exclusive of:  Separately billable procedures and treating other patients   Critical care was necessary to treat or prevent imminent or life-threatening deterioration of the following conditions:  Respiratory failure   Critical care was time spent personally by me on the following activities:  Ordering and performing treatments and interventions, ordering and review of laboratory studies, ordering and review of radiographic studies, pulse oximetry, review of old charts, examination of patient and evaluation of patient's response to treatment   Care discussed with: admitting provider   Comments:     IV diuresis for CHF exacerbation     Cottie Donnice PARAS, MD 07/29/23 1924

## 2023-07-29 NOTE — ED Notes (Signed)
 IV team at bedside

## 2023-07-29 NOTE — ED Notes (Signed)
 Charge Nurse called, report given

## 2023-07-29 NOTE — ED Triage Notes (Signed)
 Pt transferred by [facility] from Urgent Care due to tachycardia found in the 120's.  Pt initially went to urgent care due to left eye swelling and ear fullness for over one week.  At Urgent Care they noticed prolonged QTC and tachycardia.  [facility] noticed patient would go into bigeminy. Hx valve replacement, pulmonary embolism and gout. VS BP 120/80, SpO2 96%

## 2023-07-29 NOTE — ED Notes (Addendum)
 [facility] called/forms printed

## 2023-07-29 NOTE — Progress Notes (Signed)
 PHARMACY - ANTICOAGULATION CONSULT NOTE  Pharmacy Consult for warfarin Indication:  mechanical mitral valve  Allergies  Allergen Reactions   Lisinopril  Swelling   Acetaminophen -Codeine Itching   Propoxyphene Itching    Darvocet   Tape Rash    Plastic   Tramadol  Itching, Nausea And Vomiting and Rash    Patient Measurements: Height: 5' 5 (165.1 cm) Weight: 68 kg (150 lb) IBW/kg (Calculated) : 57  Vital Signs: Temp: 97.4 F (36.3 C) (02/06 2004) Temp Source: Oral (02/06 2004) BP: 100/80 (02/06 2100) Pulse Rate: 33 (02/06 2100)  Labs: Recent Labs    07/29/23 1347 07/29/23 1805  HGB  --  11.1*  HCT  --  36.1  PLT  --  479*  LABPROT 30.8*  --   INR 2.9*  --   CREATININE 1.41*  --     Estimated Creatinine Clearance: 32 mL/min (A) (by C-G formula based on SCr of 1.41 mg/dL (H)).   Medical History: Past Medical History:  Diagnosis Date   Arthritis    Asthma    Breast cancer (HCC) 2001   Left Breast Cancer   CHF (congestive heart failure) (HCC)    COPD (chronic obstructive pulmonary disease) (HCC)    Coronary artery disease    GERD (gastroesophageal reflux disease)    History of mitral valve replacement with mechanical valve    HLD (hyperlipidemia)    Hypertension    Pulmonary embolism (HCC) 2021    Medications:  Scheduled:   [START ON 07/30/2023] dapagliflozin  propanediol  10 mg Oral Daily   fluticasone   2 spray Each Nare Daily   [START ON 07/30/2023] furosemide   40 mg Intravenous Once   gabapentin   800 mg Oral TID   nicotine   14 mg Transdermal Daily   [START ON 07/30/2023] pantoprazole   40 mg Oral Daily   [START ON 07/30/2023] simvastatin   20 mg Oral Daily   [START ON 07/30/2023] warfarin  5 mg Oral Daily   [START ON 07/30/2023] Warfarin - Pharmacist Dosing Inpatient   Does not apply q1600   Infusions:   diltiazem  (CARDIZEM ) infusion 12.5 mg/hr (07/29/23 1618)    Assessment: 73 yo F presenting with tachycardia, found to be in Afib with RVR. PMH of PE,  mechanical mitral valve replacement. On warfarin PTA, last dose on 07/29/23 per med rec. Pharmacy consulted for warfarin management.  Home warfarin regimen: Patient prescribed warfarin 7.5 mg on Mon and Thurs, 5 mg all other days Per patient, she has just been taking 5 mg every day  INR 2.9 is therapeutic. Hgb 11.1. No new DDIs noted. Patient already took dose for today.    Goal of Therapy:  INR 2.5- 3.5 Monitor platelets by anticoagulation protocol: Yes   Plan:  Consider ordering warfarin 5 mg x1 for tomorrow Check daily INR and weekly CBC   Nidia Schaffer, PharmD PGY1 Pharmacy Resident  Please check AMION for all [facility] Pharmacy phone numbers After 10:00 PM, call Main Pharmacy [phone] 07/29/2023,10:14 PM

## 2023-07-29 NOTE — H&P (Addendum)
 Date: 07/30/2023               Patient Name:  Kim Anthony [name]: [id number]  DOB: 09-11-1950 Age / Sex: 73 y.o., female   PCP: Renne Homans, MD         Medical Service: Internal Medicine Teaching Service         Attending Physician: Dr. Forest Coy, MD      First Contact: Dr. Redell Burnet, MD Pager [phone]    Second Contact: Dr. Ozell Nearing, DO Pager [phone]         After Hours (After 5p/  First Contact Pager: [phone]  weekends / holidays): Second Contact Pager: [phone]   SUBJECTIVE   Chief Complaint: worsening R eye swelling and shortness of breath  History of Present Illness:   This is a 73 year old female with past medical hisotry of COPD, tobacco use, HFrEF [LVEF 35-40% 05/11/2022], mechanical mitral valve placement [2001] on chronic warfarin therapy [INR goal 2.5-3.5], paroxymal AFL/AF, CAD, hx of PE/DVT, HTN, HLD, hx breast cancer s/p left mastectomy and radiation/chemo, GERD, bilateral hearing loss L>R, polyarticular gout, osteoarthritis presented with L eye swelling and shortness of breath.   Symptoms onset last week, initially with L eye swelling, yellow drainage, and crusting of eye lid progressed to R eye crusting. Patient endorsed symptoms of minimal blurry vision L eye, nasal congestion, worsening facial swelling, productive cough with intermittent white- sputum production (not worsening), DOE, no acute orthopnea (sleeps with multiple pillows at baseline), new b/l LE edema.   Reported chronic LBP and Left hip radicular pain.    Denied any symptoms of fevers, chills, facial pain, trouble swallowing/breathing, chest pain or pleuritic CP, palpitations, nausea, vomiting, diarrhea, abdominal pain, urinary symptoms, pain in her LE calves.    States that she went to pharmacy on Tuesday/Wednesday for L eye swelling and facial glands swelling, they recommended her to be seen at [facility]. She also picked up OTC eye drops. Reports that ocular symptoms initially improved.    UC 2/6 (Thursday): tachycardiac HR 127 bpm, EKG showed prolonged QT with QTc of 603 ms and no p-waves concerning for Afib with RVR. Recommended to be evaluated ED.   ED Course: - Presented to ED with VS 109/67, HR 117, RR 22, SpO2 98%.  - EDP started patient on diltiazem  infusion.  - Pharmacy doses warfarin.   Past Medical History: As above  Past Medical History:  Diagnosis Date   Arthritis    Asthma    Breast cancer (HCC) 2001   Left Breast Cancer   CHF (congestive heart failure) (HCC)    COPD (chronic obstructive pulmonary disease) (HCC)    Coronary artery disease    GERD (gastroesophageal reflux disease)    History of mitral valve replacement with mechanical valve    HLD (hyperlipidemia)    Hypertension    Pulmonary embolism (HCC) 2021    Meds:  Discrepancy between which medications patient is taking as she told us  vs the pharmacist.   Albuterol  inhaler  Colchicine  0.6mg  daily Cymbalta  20 mg daily, unsure if taking  Farxiga  10 mg daily Gabapentin  800 mg TID Norco/Vicodin 5-325 mg q4 prn  Losartan  25mg , 1/2 a tablet daily, unsure if she is taking Metoprolol  25mg  daily, unsure if she is taking  Protonix  40 mg daily   Prednisone  10 mg Pak- states that she is currently taking. Last prescribed 07/23/2023 for a 6 day course.   Simvastatin  20mg  daily Torsemide  20 mg, three tablets by mouth daily  Trelegy Ellipta  inhaler  Warfarin 5 mg - Take one-and-one-half (1 & 1/2) of your 5 mg tablets on Mondays and Thursdays. All other days, take one tablet. States she is taking as prescribed. [Pharmacist note reported that she is taking one tablet daily]  Procedures: 07/23/2023: Corticosteroid injection into bilateral wrist by Ortho  Past Surgical History:  Procedure Laterality Date   APPLICATION OF A-CELL OF CHEST/ABDOMEN Left 01/27/2019   Procedure: APPLICATION OF A-CELL OF CHEST;  Surgeon: Lowery Estefana RAMAN, DO;  Location: [facility] OR;  Service: Plastics;  Laterality: Left;    APPLICATION OF WOUND VAC N/A 01/27/2019   Procedure: APPLICATION OF WOUND VAC;  Surgeon: Lowery Estefana RAMAN, DO;  Location: [facility] OR;  Service: Plastics;  Laterality: N/A;   BREAST SURGERY Left 2001   for CA   CARDIOVERSION N/A 02/26/2022   Procedure: CARDIOVERSION;  Surgeon: Kate Lonni CROME, MD;  Location: [facility] ENDOSCOPY;  Service: Cardiovascular;  Laterality: N/A;   COLONOSCOPY WITH PROPOFOL  N/A 06/07/2018   Procedure: COLONOSCOPY WITH PROPOFOL ;  Surgeon: Dianna Specking, MD;  Location: [facility] ENDOSCOPY;  Service: Endoscopy;  Laterality: N/A;   DEBRIDEMENT AND CLOSURE WOUND Left 12/28/2018   Procedure: Debridement And Closure Wound;  Surgeon: Vernetta Berg, MD;  Location: [facility] OR;  Service: General;  Laterality: Left;   INCISION AND DRAINAGE OF WOUND Left 01/27/2019   Procedure: IRRIGATION AND DEBRIDEMENT OF CHEST WALL;  Surgeon: Lowery Estefana RAMAN, DO;  Location: [facility] OR;  Service: Plastics;  Laterality: Left;   INTRAMEDULLARY (IM) NAIL INTERTROCHANTERIC N/A 09/23/2022   Procedure: INTRAMEDULLARY (IM) NAIL INTERTROCHANTERIC;  Surgeon: Jerri Kay HERO, MD;  Location: [facility] OR;  Service: Orthopedics;  Laterality: N/A;   KNEE ARTHROSCOPY WITH MEDIAL MENISECTOMY Left 08/06/2021   Procedure: KNEE ARTHROSCOPY WITH PARTIAL MEDIAL MENISECTOMY, DEBRIDEMENT;  Surgeon: Duwayne Purchase, MD;  Location: [facility] ORS;  Service: Orthopedics;  Laterality: Left;  1 HR   LATISSIMUS FLAP TO BREAST Left 01/18/2019   Procedure: LEFT LATISSIMUS FLAP TO BREAST;  Surgeon: Lowery Estefana RAMAN, DO;  Location: [facility] OR;  Service: Plastics;  Laterality: Left;   MASTECTOMY Left    MITRAL VALVE REPLACEMENT     mechanical   POLYPECTOMY  06/07/2018   Procedure: POLYPECTOMY;  Surgeon: Dianna Specking, MD;  Location: [facility] ENDOSCOPY;  Service: Endoscopy;;   RIGHT/LEFT HEART CATH AND CORONARY ANGIOGRAPHY N/A 01/09/2021   Procedure: RIGHT/LEFT HEART CATH AND CORONARY ANGIOGRAPHY;  Surgeon: Cherrie Toribio SAUNDERS, MD;  Location: [facility] INVASIVE  CV LAB;  Service: Cardiovascular;  Laterality: N/A;   WOUND DEBRIDEMENT Left 12/28/2018   Procedure: EXCISION OF LEFT CHRONIC CHEST WALL WOUND;  Surgeon: Vernetta Berg, MD;  Location: [facility] OR;  Service: General;  Laterality: Left;    Social:  Living Situation: Lives with her daughter in [place] Functionality: Independent of her ADLs/iADLS. Sometimes uses cane for mobility.  PCP: [place], Prince, MD Tobacco: smokes 4-5 cigarrettes a day. Has been smoking for 29 years. Alcohol : drinks 1-2 beers daily, last drink was on Wednesday night. Has gone without alcohol  for a day without withdrawal symptoms. No previous history of withdrawal seizures.  Drugs: None   Family History: Father had bone cancer  Aunt had breast cancer and lung cancer   Allergies: Allergies as of 07/29/2023 - Review Complete 07/29/2023  Allergen Reaction Noted   Lisinopril  Swelling 01/07/2021   Acetaminophen -codeine Itching 07/10/2015   Propoxyphene Itching 01/18/2019   Tape Rash 03/09/2019   Tramadol  Itching, Nausea And Vomiting, and Rash 07/10/2015    Review of Systems: A complete ROS was  negative except as per HPI.   OBJECTIVE:   Physical Exam: Blood pressure 98/62, pulse 93, temperature (!) 97.4 F (36.3 C), temperature source Oral, resp. rate 18, height 5' 5 (1.651 m), weight 68 kg, SpO2 99%.   Constitutional: Appears ill, laying with head of bed elevated, on 2-3 L oxygen  [place] HENT: normocephalic atraumatic,  Eyes: periorbital swelling L>R, no conjunctival injection or drainage. +Minimal swelling and erythema L upper face  Neck: +b/l tonsillar lymph node swelling  Cardiovascular: +mechanical mitral valve click. 2+ pitting edema LLE and 1+ pitting edema RLE.  Pulmonary/Chest: limited due to pain with movement. +minimal crackles RLL. No wheezing or rales.  Abdominal: soft, non-tender, non-distended, bowel sounds present  MSK: +L Straight Leg raise test and pain reproduction with dorsiflexion of L foot. TTP Left  hip.  Neurological: alert & oriented x 3. Skin: warm.   Labs: CBC    Component Value Date/Time   WBC 10.2 07/30/2023 0350   RBC 4.07 07/30/2023 0350   HGB 11.8 (L) 07/30/2023 0350   HGB 12.8 01/13/2022 1038   HCT 39.5 07/30/2023 0350   HCT 38.8 01/13/2022 1038   PLT 380 07/30/2023 0350   PLT 427 01/13/2022 1038   MCV 97.1 07/30/2023 0350   MCV 96 01/13/2022 1038   MCH 29.0 07/30/2023 0350   MCHC 29.9 (L) 07/30/2023 0350   RDW 15.1 07/30/2023 0350   RDW 11.4 (L) 01/13/2022 1038   LYMPHSABS 1.3 11/17/2022 0646   LYMPHSABS 3.0 04/21/2018 1624   MONOABS 0.7 11/17/2022 0646   EOSABS 0.1 11/17/2022 0646   EOSABS 0.2 04/21/2018 1624   BASOSABS 0.1 11/17/2022 0646   BASOSABS 0.1 04/21/2018 1624     CMP     Component Value Date/Time   NA 135 07/30/2023 0350   NA 134 04/16/2023 1036   K 3.8 07/30/2023 0350   CL 101 07/30/2023 0350   CO2 19 (L) 07/30/2023 0350   GLUCOSE 103 (H) 07/30/2023 0350   BUN 39 (H) 07/30/2023 0350   BUN 20 04/16/2023 1036   CREATININE 1.33 (H) 07/30/2023 0350   CALCIUM  8.3 (L) 07/30/2023 0350   PROT 7.2 11/17/2022 1135   PROT 7.9 01/27/2021 1015   ALBUMIN 3.1 (L) 11/17/2022 1135   ALBUMIN 4.6 01/27/2021 1015   AST 32 11/17/2022 1135   ALT 13 11/17/2022 1135   ALKPHOS 140 (H) 11/17/2022 1135   BILITOT 0.8 11/17/2022 1135   BILITOT 0.5 01/27/2021 1015   GFRNONAA 42 (L) 07/30/2023 0350   GFRAA 104 02/28/2020 1159    Imaging: DG HIP PORT UNILAT WITH PELVIS 1V LEFT Result Date: 07/30/2023 CLINICAL DATA:  Left hip pain EXAM: DG HIP (WITH OR WITHOUT PELVIS) 1V PORT LEFT COMPARISON:  None Available. FINDINGS: Moderate left hip osteoarthrosis with narrowing of the joint space. No fracture or dislocation. Chronic displacement of the right lesser trochanter. Right femoral intramedullary nail. IMPRESSION: Moderate left hip osteoarthrosis. Electronically Signed   By: Franky Stanford M.D.   On: 07/30/2023 04:03   DG Chest Portable 1 View Result Date:  07/29/2023 CLINICAL DATA:  Shortness of breath and cough. EXAM: PORTABLE CHEST 1 VIEW COMPARISON:  Chest radiograph dated 11/17/2022. FINDINGS: There is shallow inspiration. Background of emphysema and interstitial coarsening. An area of airspace opacity in the left mid lung field likely corresponding to the subpleural scarring seen on CT. No new consolidation. There is no pleural effusion or pneumothorax. There is cardiomegaly. Median sternotomy wires. No acute osseous pathology. IMPRESSION: 1. No acute cardiopulmonary process.  2. Cardiomegaly. Electronically Signed   By: Vanetta Chou M.D.   On: 07/29/2023 14:34   DG Chest Port 1 View IMPRESSION: 1. No acute cardiopulmonary process. 2. Cardiomegaly.  EKG: personally reviewed my interpretation is tachycardia with ventricular rate 125 and prolonged QTC 576 and QRS 150 ms. RBBB. Previous EKG findings of incomplete RBBB.   ASSESSMENT & PLAN:   Assessment & Plan by Problem: Active Problems:   Acute hypoxic respiratory failure (HCC)   JEANAE WHITMILL is a 73 y.o. with significant past medical history of COPD, tobacco use, HFrEF [LVEF 35-40% 05/11/2022], mechanical mitral valve placement [2001] on chronic warfarin therapy [INR goal 2.5-3.5], paroxymal AFL/AF, CAD, hx of PE/DVT, HTN, HLD, hx breast cancer s/p left mastectomy and radiation/chemo, GERD, bilateral hearing loss L>R, polyarticular gout, osteoarthritis presented with R eye swelling and shortness of breath.   Paroxysmal Aflutter Prolonged QTC Hx of Afib/AFL Mechanical Mitral Valve replacement Chronic Coumadin  Therapy [INR goal 2.5-3.5} MVR performed in [place] in 2001, currently on chronic coumadin  therapy. Presented to UC with tachycardia HR 127 bpm, EKG showed prolonged QT and concerns for Afib with RVR. Sent to ED and started on diltiazem  infusion. Upon thorough review of the serial EKGs, initial EKG (12:49 PM) showed sinus artrial tachycardia with inverted p-waves (I lead), RBBB  (previous EKG incomplete RBBB 12/31/22) and prolonged QT 576. Repeat EKG (15:50) showed sinus or ectopic artrial tachycardia with biphasic p waves, ventricular rate 122 bpm. Not consistent with Afib RVR. Repeat EKG again (00:17 07/29/22) showed Aflutter with irregular rhythm, no discernable p-waves, RBBB, and unclear if LAFD, QTC 516 . Per last cardiology note 01/19/2023, reported to continue amio 200 mg daily despite concerns for prolonging QTC and underlying COPD; however she is currently not taking amiodarone . Last dispense history is 10/24/2022. In addition, looking at previous notes, she does have history of missing warfarin doses. Initial INR 2.9. Informally consulted cardiology, appreciate their assistance and recommendation of transitioning from IV Diltiazem  to IV Amiodarone  in the setting of low EF (HF exacerbation), history of afib/aflutter and mechanical mitral valve. Cardiology recommended Amio drip with 1 mg/minute for 6 hours, then 0.5 mg/minute for 18 hours, then transitioning to PO load.  Patient was started on Amio drip however blood pressures drop with SBP 90s-70s. Held Amio drip. Officially consulted cardiology.   - Follow up cardiology recommendation   - Continue pharmacy dosed Warfarin with therapeutic INR goal 2.5-3.5   - Follow up on PT/INR  - Follow up BMP/K - Follow up EKG AM for QTC   AHRF HFrEF [35-40%], Exacerbation  Hypokalemia  Hypotension  Presented with 1 week symptoms of DOE, productive cough with intermittent white-sputum production, bilateral LE swelling, facial swelling. No acute orthopnea. CXR negative for vascular congestion, consolidation or pleural effusion. Labs showed K 3.4, Mg 1.8, BNP 374.8. Per evaluation, evidence of 2+LLE pitting edema and 1+ RLE pitting edema, with minimal RLL crackles, no wheezing. Etiology currently multifactorial with considering viral URI vs arrhythmia vs acute and chronic steroids use vs medication non-compliance. Last ECHO 05/11/22 showed  Echo 11/23 LVEF 35-40%, Severe RV dysfunction, MVR stable Mean gradient 3. Severe TR RVSP 48  Per last cardiology note 01/19/2023, GDMT regimen torsemide  60 mg daily, losartan  12.5 mg daily, spironolactone  25 mg daily, Toprol  XL 25 mg daily and Farxiga  10 mg daily. Unclear if patient is taking her ARB and BB. Not taking spironolactone  despite cardiology note. In ED, patient received IV lasix  40 mg, currently on 2 L nasal  cannula saturating above 92%.      Plan: - Continue IV Lasix  40 mg daily - HOLD GDMT in the setting of hypotension, SBP ~90s-110s and AKI - Ordered ECHO, follow up  - Replete with Goals K > 4.0 and Mg > 2.0  Suspected Viral URI Tonsillar lymphadenopathy  Leukocytosis  Thrombocytosis Reported symptoms of nasal congestion, Left eye swelling, yellowish discharge, crusting of eye lids, and lymphadenopathy. Extended RVP negative. Labs showed WBC 11.1 and PLT 479. Per evaluation, minimal periorbital swelling and obvious b/l lymphadenopathy located at the sub-angle of mandible, more consistent with tonsillar lymph nodes. Etiology more likely viral. No airway obstruction. Breathing appropriately. No trouble swallowing. No current vision changes. No facial tenderness at this time. Will continue to monitor, no current indications for Abx.    AKI Presented with Cr 1.41 (baseline ~1.06), GFR 39. Denied any symptoms of external GI losses. Etiology unclear at this time, but can be from above. Will continue to trend Cr and hold home anti-hypertensive medications at this time. - Trend Cr - Hold Losartan  and Farxiga .   Hx of COPD Tobacco Use Last PFTs performed on 03/11/20 with post- FEV1/FVC ratio of 75%. Home medication consist of Trelegy Ellipta  inhaler. CXR findings of emphysema and interstitial coarsening. An area of airspace opacity in the left mid lung field likely corresponding to the subpleural scarring noted per previous CT. No acute findings. Per evaluation, no wheezing noted. Patient  is currently smoking 4-5 cigs a day, a long term smoker.  - Continue formulary Breo Ellipta  and Incruse Ellipta   - Nicotine  patch 14 mg   Chronic Low back pain  Reports radicular pain starting at low back pain and radiating down left LE. Reports chronic, on Norco/Vicodin 5-325 mg tablet q6h at home. Per evaluation, pain is reproduced with straight leg raise test and dorsiflexion of the LLE.  - follow up L hip x ray  - Continue home medication Norco/Vicodin 5-325 mg tablet q6h - Topical ointments and lidocaine  patches   Chronic conditions: HTN: Hold in the setting AKI and softer blood pressures with SBP ~90s-110s HLD: Continue home medication simvastatin  20 mg daily  Hx of polyarticular gout: Continue home medication colchicine  0.3 mg daily  GERD: Continue home medication Protonix  EC 40 mg daily    Diet: Heart Healthy VTE:  warfarin  IVF: None,None Code: Full  Prior to Admission Living Arrangement: Home, living daughter Anticipated Discharge Location: Home Barriers to Discharge: Medical Management   Dispo: Admit patient to Inpatient with expected length of stay greater than 2 midnights.  Signed: Heddy Barren, DO Internal Medicine Resident PGY-1  07/30/2023, 6:23 AM

## 2023-07-29 NOTE — ED Notes (Signed)
 Pt taken off bedpan. Pt cleaned and readjusted in bed.

## 2023-07-30 ENCOUNTER — Observation Stay (HOSPITAL_COMMUNITY): Payer: 59

## 2023-07-30 DIAGNOSIS — Z79899 Other long term (current) drug therapy: Secondary | ICD-10-CM | POA: Diagnosis not present

## 2023-07-30 DIAGNOSIS — Z888 Allergy status to other drugs, medicaments and biological substances status: Secondary | ICD-10-CM | POA: Diagnosis not present

## 2023-07-30 DIAGNOSIS — B0229 Other postherpetic nervous system involvement: Secondary | ICD-10-CM | POA: Diagnosis present

## 2023-07-30 DIAGNOSIS — R0902 Hypoxemia: Secondary | ICD-10-CM | POA: Diagnosis present

## 2023-07-30 DIAGNOSIS — E785 Hyperlipidemia, unspecified: Secondary | ICD-10-CM | POA: Diagnosis present

## 2023-07-30 DIAGNOSIS — I5043 Acute on chronic combined systolic (congestive) and diastolic (congestive) heart failure: Secondary | ICD-10-CM

## 2023-07-30 DIAGNOSIS — I130 Hypertensive heart and chronic kidney disease with heart failure and stage 1 through stage 4 chronic kidney disease, or unspecified chronic kidney disease: Secondary | ICD-10-CM | POA: Diagnosis present

## 2023-07-30 DIAGNOSIS — I484 Atypical atrial flutter: Secondary | ICD-10-CM | POA: Diagnosis present

## 2023-07-30 DIAGNOSIS — N183 Chronic kidney disease, stage 3 unspecified: Secondary | ICD-10-CM | POA: Insufficient documentation

## 2023-07-30 DIAGNOSIS — J439 Emphysema, unspecified: Secondary | ICD-10-CM | POA: Diagnosis present

## 2023-07-30 DIAGNOSIS — J9621 Acute and chronic respiratory failure with hypoxia: Secondary | ICD-10-CM

## 2023-07-30 DIAGNOSIS — J9601 Acute respiratory failure with hypoxia: Secondary | ICD-10-CM | POA: Diagnosis present

## 2023-07-30 DIAGNOSIS — G8929 Other chronic pain: Secondary | ICD-10-CM | POA: Diagnosis present

## 2023-07-30 DIAGNOSIS — Z885 Allergy status to narcotic agent status: Secondary | ICD-10-CM | POA: Diagnosis not present

## 2023-07-30 DIAGNOSIS — I509 Heart failure, unspecified: Secondary | ICD-10-CM

## 2023-07-30 DIAGNOSIS — Z952 Presence of prosthetic heart valve: Secondary | ICD-10-CM | POA: Diagnosis not present

## 2023-07-30 DIAGNOSIS — I50810 Right heart failure, unspecified: Secondary | ICD-10-CM | POA: Diagnosis not present

## 2023-07-30 DIAGNOSIS — I2510 Atherosclerotic heart disease of native coronary artery without angina pectoris: Secondary | ICD-10-CM | POA: Diagnosis present

## 2023-07-30 DIAGNOSIS — M25552 Pain in left hip: Secondary | ICD-10-CM | POA: Diagnosis not present

## 2023-07-30 DIAGNOSIS — Z7901 Long term (current) use of anticoagulants: Secondary | ICD-10-CM | POA: Diagnosis not present

## 2023-07-30 DIAGNOSIS — F17210 Nicotine dependence, cigarettes, uncomplicated: Secondary | ICD-10-CM | POA: Diagnosis present

## 2023-07-30 DIAGNOSIS — Z9012 Acquired absence of left breast and nipple: Secondary | ICD-10-CM | POA: Diagnosis not present

## 2023-07-30 DIAGNOSIS — Z8249 Family history of ischemic heart disease and other diseases of the circulatory system: Secondary | ICD-10-CM | POA: Diagnosis not present

## 2023-07-30 DIAGNOSIS — M109 Gout, unspecified: Secondary | ICD-10-CM | POA: Diagnosis present

## 2023-07-30 DIAGNOSIS — M1612 Unilateral primary osteoarthritis, left hip: Secondary | ICD-10-CM | POA: Diagnosis not present

## 2023-07-30 DIAGNOSIS — K219 Gastro-esophageal reflux disease without esophagitis: Secondary | ICD-10-CM | POA: Diagnosis present

## 2023-07-30 DIAGNOSIS — I4891 Unspecified atrial fibrillation: Secondary | ICD-10-CM

## 2023-07-30 DIAGNOSIS — N179 Acute kidney failure, unspecified: Secondary | ICD-10-CM | POA: Diagnosis present

## 2023-07-30 DIAGNOSIS — I480 Paroxysmal atrial fibrillation: Secondary | ICD-10-CM | POA: Diagnosis present

## 2023-07-30 DIAGNOSIS — N1831 Chronic kidney disease, stage 3a: Secondary | ICD-10-CM | POA: Insufficient documentation

## 2023-07-30 DIAGNOSIS — J9610 Chronic respiratory failure, unspecified whether with hypoxia or hypercapnia: Secondary | ICD-10-CM | POA: Diagnosis present

## 2023-07-30 DIAGNOSIS — I4892 Unspecified atrial flutter: Secondary | ICD-10-CM

## 2023-07-30 DIAGNOSIS — E876 Hypokalemia: Secondary | ICD-10-CM | POA: Diagnosis present

## 2023-07-30 DIAGNOSIS — I5082 Biventricular heart failure: Secondary | ICD-10-CM | POA: Diagnosis present

## 2023-07-30 DIAGNOSIS — I5023 Acute on chronic systolic (congestive) heart failure: Secondary | ICD-10-CM | POA: Diagnosis not present

## 2023-07-30 HISTORY — DX: Acute and chronic respiratory failure with hypoxia: J96.21

## 2023-07-30 LAB — RESPIRATORY PANEL BY PCR

## 2023-07-30 LAB — MRSA NEXT GEN BY PCR, NASAL: MRSA by PCR Next Gen: NOT DETECTED

## 2023-07-30 LAB — PROTIME-INR
INR: 2.8 — ABNORMAL HIGH (ref 0.8–1.2)
Prothrombin Time: 29.9 s — ABNORMAL HIGH (ref 11.4–15.2)

## 2023-07-30 LAB — ECHOCARDIOGRAM COMPLETE
Est EF: 50
Height: 65 in
S' Lateral: 2.9 cm
Weight: 2400 [oz_av]

## 2023-07-30 LAB — BASIC METABOLIC PANEL
Anion gap: 15 (ref 5–15)
BUN: 39 mg/dL — ABNORMAL HIGH (ref 8–23)
CO2: 19 mmol/L — ABNORMAL LOW (ref 22–32)
Calcium: 8.3 mg/dL — ABNORMAL LOW (ref 8.9–10.3)
Chloride: 101 mmol/L (ref 98–111)
Creatinine, Ser: 1.33 mg/dL — ABNORMAL HIGH (ref 0.44–1.00)
GFR, Estimated: 42 mL/min — ABNORMAL LOW (ref >60)
Glucose, Bld: 103 mg/dL — ABNORMAL HIGH (ref 70–99)
Potassium: 3.8 mmol/L (ref 3.5–5.1)
Sodium: 135 mmol/L (ref 135–145)

## 2023-07-30 LAB — CBC
HCT: 39.5 % (ref 36.0–46.0)
Hemoglobin: 11.8 g/dL — ABNORMAL LOW (ref 12.0–15.0)
MCH: 29 pg (ref 26.0–34.0)
MCHC: 29.9 g/dL — ABNORMAL LOW (ref 30.0–36.0)
MCV: 97.1 fL (ref 80.0–100.0)
Platelets: 380 10*3/uL (ref 150–400)
RBC: 4.07 MIL/uL (ref 3.87–5.11)
RDW: 15.1 % (ref 11.5–15.5)
WBC: 10.2 10*3/uL (ref 4.0–10.5)
nRBC: 1.1 % — ABNORMAL HIGH (ref 0.0–0.2)

## 2023-07-30 LAB — MAGNESIUM: Magnesium: 1.8 mg/dL (ref 1.7–2.4)

## 2023-07-30 MED ORDER — GABAPENTIN 300 MG PO CAPS
300.0000 mg | ORAL_CAPSULE | Freq: Three times a day (TID) | ORAL | Status: DC
  Administered 2023-07-30 – 2023-08-01 (×7): 300 mg via ORAL
  Filled 2023-07-30 (×7): qty 1

## 2023-07-30 MED ORDER — AMIODARONE HCL IN DEXTROSE 360-4.14 MG/200ML-% IV SOLN
60.0000 mg/h | INTRAVENOUS | Status: AC
  Administered 2023-07-30: 60 mg/h via INTRAVENOUS
  Filled 2023-07-30: qty 200

## 2023-07-30 MED ORDER — POTASSIUM CHLORIDE CRYS ER 20 MEQ PO TBCR
20.0000 meq | EXTENDED_RELEASE_TABLET | Freq: Once | ORAL | Status: AC
  Administered 2023-07-30: 20 meq via ORAL
  Filled 2023-07-30: qty 1

## 2023-07-30 MED ORDER — HYDROMORPHONE HCL 1 MG/ML IJ SOLN
0.5000 mg | Freq: Once | INTRAMUSCULAR | Status: AC
  Administered 2023-07-30: 0.5 mg via INTRAVENOUS
  Filled 2023-07-30: qty 1

## 2023-07-30 MED ORDER — FUROSEMIDE 10 MG/ML IJ SOLN
60.0000 mg | Freq: Once | INTRAMUSCULAR | Status: AC
  Administered 2023-07-30: 60 mg via INTRAVENOUS
  Filled 2023-07-30: qty 6

## 2023-07-30 MED ORDER — AMIODARONE HCL IN DEXTROSE 360-4.14 MG/200ML-% IV SOLN: 30.0000 mg/h | INTRAVENOUS | Status: DC

## 2023-07-30 MED ORDER — COLCHICINE 0.3 MG HALF TABLET
0.3000 mg | ORAL_TABLET | Freq: Every day | ORAL | Status: DC
  Administered 2023-07-30 – 2023-08-01 (×4): 0.3 mg via ORAL
  Filled 2023-07-30 (×5): qty 1

## 2023-07-30 MED ORDER — MAGNESIUM SULFATE 2 GM/50ML IV SOLN
2.0000 g | Freq: Once | INTRAVENOUS | Status: AC
  Administered 2023-07-30: 2 g via INTRAVENOUS
  Filled 2023-07-30: qty 50

## 2023-07-30 MED ORDER — LIDOCAINE 5 % EX PTCH
1.0000 | MEDICATED_PATCH | Freq: Every day | CUTANEOUS | Status: DC
  Administered 2023-07-30: 1 via TRANSDERMAL
  Filled 2023-07-30 (×3): qty 1

## 2023-07-30 MED ORDER — WARFARIN SODIUM 5 MG PO TABS
5.0000 mg | ORAL_TABLET | Freq: Once | ORAL | Status: AC
  Administered 2023-07-30: 5 mg via ORAL
  Filled 2023-07-30 (×2): qty 1

## 2023-07-30 MED ORDER — DICLOFENAC SODIUM 1 % EX GEL
2.0000 g | Freq: Four times a day (QID) | CUTANEOUS | Status: DC | PRN
  Filled 2023-07-30: qty 100

## 2023-07-30 MED ORDER — AMIODARONE HCL 200 MG PO TABS
200.0000 mg | ORAL_TABLET | Freq: Every day | ORAL | Status: DC
  Administered 2023-07-30 – 2023-07-31 (×2): 200 mg via ORAL
  Filled 2023-07-30 (×2): qty 1

## 2023-07-30 NOTE — Progress Notes (Addendum)
                  Subjective:   Summary: 73 yo F, history of COPD, HFrEF EF 35-40%, mitral valve replacement on warfarin, paroxysmal Afib/Aflutter presenting with shortness of breath, admitted for tachycardia and HFrEF exacerbation  Patient feeling better today, main complaint is myalgias and chronic hip and back pain. Denies shortness of breath, chest pain, dizziness.   Objective:  Vital signs in last 24 hours: Vitals:   07/30/23 0600 07/30/23 0630 07/30/23 0830 07/30/23 1027  BP: 115/76 95/64 116/80   Pulse: 76 72 75   Resp: 18 16 16    Temp:    98.3 F (36.8 C)  TempSrc:    Oral  SpO2: 97% 94% 96%   Weight:      Height:       Supplemental O2: Nasal Cannula SpO2: 96 % O2 Flow Rate (L/min): 2 L/min  Physical Exam:  Constitutional: in no acute distress, nontoxic appearing HEENT: some mild L periorbital edema, no discharge or crusting Cardiovascular: RRR, no murmurs, rubs or gallops Pulmonary/Chest: no respiratory distress, some mild crackles in lung bases Abdominal: soft, non-tender, non-distended Neuro: 5/5 strength, no focal deficit noted  Assessment/Plan:   Paroxysmal atrial flutter Atrial fibrillation Appeared to be in Afib with RVR upon arrival to the ED, started on diltiazem  drip. Switched to amiodarone  drip, currently in NSR. Amiodarone  has been discontinued in the past due to prolonged QT.  - Per cardiology, restart amiodarone  200mg  daily  Acute on chronic biventricular heart failure HFrEF 35-40% Acute hypoxic respiratory failure, resolved Patient presented with dyspnea and bilateral LEE, BNP 375, CXR without signs of acute cardiopulmonary process. Currently on room air. Does not appear overtly volume overloaded on exam.  - S/p Lasix  60mg  this am - Repeat echo pending - holding losartan , metoprolol , Farxiga   Hx mitral valve replacement On chronic warfarin therapy.  - Resume warfarin per pharmacy  Viral conjunctivitis, resolved Small R>L periorbital  edema, no discharge or crusting. Patient feels like her symptoms have improved and are not concerning to her. No interventions required, will continue to monitor.   AKI Creatinine 1.33 from 1.41 yesterday. Likely cardiorenal 2/2 acute on chronic systolic heart failure. Continue to monitor with daily labs, will likely improve with diuresis and returning to euvolemia  COPD Continue home Breo Ellipta  and Incruse Ellipta  - Wean oxygen  as tolerated for O2 sat goal 88-92%  Post-herpetic neuralgia Patient reports recently recovering from shingles with continued residual pain in her left groin. Hyperesthesia but no active rashes.  - Continue gabapentin  300mg  TID, decreased dosage for renal dosing  Chronic low back pain: continue home Norco/Vicodin 5-325mg  q6h HTN: holding home antihypertensives HLD: home simvastatin  20mg  Gout: home colchicine  0.3mg  daily GERD: home protonix  40mg  daily  Diet: Heart healthy VTE: Warfarin Code: Full  Dispo: Anticipated discharge to Home pending medical stability.   Redell Burnet, MD PGY-1 Internal Medicine Resident Pager Number [phone] Please contact the on call pager after 5 pm and on weekends at [phone].

## 2023-07-30 NOTE — Progress Notes (Signed)
 PHARMACY - ANTICOAGULATION CONSULT NOTE  Pharmacy Consult for warfarin Indication:  mechanical mitral valve  Allergies  Allergen Reactions   Lisinopril  Swelling   Acetaminophen -Codeine Itching   Propoxyphene Itching    Darvocet   Tape Rash    Plastic   Tramadol  Itching, Nausea And Vomiting and Rash    Patient Measurements: Height: 5' 5 (165.1 cm) Weight: 68 kg (150 lb) IBW/kg (Calculated) : 57  Vital Signs: Temp: 98.4 F (36.9 C) (02/07 0315) Temp Source: Oral (02/07 0315) BP: 115/76 (02/07 0600) Pulse Rate: 76 (02/07 0600)  Labs: Recent Labs    07/29/23 1347 07/29/23 1805 07/30/23 0350  HGB  --  11.1* 11.8*  HCT  --  36.1 39.5  PLT  --  479* 380  LABPROT 30.8*  --  29.9*  INR 2.9*  --  2.8*  CREATININE 1.41*  --  1.33*    Estimated Creatinine Clearance: 33.9 mL/min (A) (by C-G formula based on SCr of 1.33 mg/dL (H)).   Medical History: Past Medical History:  Diagnosis Date   Arthritis    Asthma    Breast cancer (HCC) 2001   Left Breast Cancer   CHF (congestive heart failure) (HCC)    COPD (chronic obstructive pulmonary disease) (HCC)    Coronary artery disease    GERD (gastroesophageal reflux disease)    History of mitral valve replacement with mechanical valve    HLD (hyperlipidemia)    Hypertension    Pulmonary embolism (HCC) 2021    Medications:  Scheduled:   colchicine   0.3 mg Oral Daily   fluticasone   2 spray Each Nare Daily   fluticasone  furoate-vilanterol  1 puff Inhalation Daily   And   umeclidinium bromide   1 puff Inhalation Daily   furosemide   40 mg Intravenous Once   gabapentin   800 mg Oral TID   lidocaine   1 patch Transdermal QHS   nicotine   14 mg Transdermal Daily   pantoprazole   40 mg Oral Daily   simvastatin   20 mg Oral Daily   Warfarin - Pharmacist Dosing Inpatient   Does not apply q1600   Infusions:   amiodarone  Stopped (07/30/23 0223)   amiodarone       Assessment: 73 yo F presenting with tachycardia, found to be in  Afib with RVR. PMH of PE, mechanical mitral valve replacement. On warfarin PTA, last dose on 07/29/23 per med rec. Pharmacy consulted for warfarin management.  Home warfarin regimen: Patient prescribed warfarin 7.5 mg on Mon and Thurs, 5 mg all other days Per patient, she has just been taking 5 mg every day  2/7 AM update: INR 2.8 is therapeutic. Hgb 11.8   Amiodarone  PTA but patient has not filled any prescriptions that we are able to locate since 10/24/2022. Re-initiated amio as a gtt.   Goal of Therapy:  INR 2.5- 3.5 Monitor platelets by anticoagulation protocol: Yes   Plan:  Initiate PTA dosing- Warfarin 7.5 mg on M/TH and 5 mg all other days Warfarin 5 mg 2/7 Check daily INR and weekly CBC   Vincent Streater BS, PharmD, BCPS Clinical Pharmacist 07/30/2023 6:17 AM  Contact: [phone] after 3 PM  Be curious, not judgmental... -Davina Sprinkles

## 2023-07-30 NOTE — Progress Notes (Signed)
 Heart Failure Navigator Progress Note  Assessed for Heart & Vascular TOC clinic readiness.  Patient does not meet criteria due to Advanced Heart Failure Team patient of Dr. Gala Romney. .   Navigator will sign off at this time.   Rhae Hammock, BSN, Scientist, clinical (histocompatibility and immunogenetics) Only

## 2023-07-30 NOTE — Consult Note (Signed)
 Cardiology Consultation   Patient ID: Kim Anthony [name]: [id number]; DOB: [birth year]  Admit date: 07/29/2023 Date of Consult: 07/30/2023  PCP:  Kim Homans, MD   [facility] Providers Cardiologist:  Toribio Fuel, MD        Patient Profile:  Kim Anthony is a 73 y.o. female with a hx of COPD, tobacco use, HFrEF [LVEF 35-40% 05/11/2022], mechanical mitral valve placement (2001; on warfarin, INR goal 2.5-3.5), paroxymal AFL/AF, CAD, hx of PE/DVT, HTN, HLD, hx breast cancer s/p left mastectomy and radiation/chemo, GERD, bilateral hearing loss L>R, polyarticular gout, osteoarthritis  who is being seen 07/30/2023 for the evaluation of AFL at the request of Hospital medicine.Kim Anthony  History of Present Illness:  Patient initially presented after 1 week of left eye and facial swelling. Around 3 days ago she reported developing worsening bilateral LE swelling and DOE. She does also report a cough for the past 2 weeks. She reports normally taking torsemide  60mg  daily, though often does not take this if she is going out of the house and often forgets to take it when she gets home. She recall missing 2 doses of torsemide  this past week. She does also note that she occasionally mises doses of her warfarin. She denies any PND. Sleeps on 6 pillows. Dry weight reportedly ~160lbs. She presented presented to urgent care with c/f AF with RVR and was sent to the ER.   In the ER, patient was found to be tachycardic with HR 110-120, HR 117, RR 22, sat 98%on RA. EKG consistent with AFL with variable block. Labs notable for: Cr 1.41, BUN 38, K 3.4, Mag 1.8, CO2 20, BNP 374.8. CXR unremarkable. The patient was started on a diltiazem  gtt and given lasix  40mg  IV. After discussion, patient was switched from diltiazem  gtt given her reduced EF to IV amiodarone . The patient spontaneously converted to NSR, but was subsequently hypotensive with SBP in 70s and IV amiodarone  was stopped by the primary team.  Upon my  evaluation, patient denies any chest pain, reports improvement in sob with diuresis.   Past Medical History:  Diagnosis Date   Arthritis    Asthma    Breast cancer (HCC) 2001   Left Breast Cancer   CHF (congestive heart failure) (HCC)    COPD (chronic obstructive pulmonary disease) (HCC)    Coronary artery disease    GERD (gastroesophageal reflux disease)    History of mitral valve replacement with mechanical valve    HLD (hyperlipidemia)    Hypertension    Pulmonary embolism (HCC) 2021    Past Surgical History:  Procedure Laterality Date   APPLICATION OF A-CELL OF CHEST/ABDOMEN Left 01/27/2019   Procedure: APPLICATION OF A-CELL OF CHEST;  Surgeon: Lowery Estefana RAMAN, DO;  Location: [facility] OR;  Service: Plastics;  Laterality: Left;   APPLICATION OF WOUND VAC N/A 01/27/2019   Procedure: APPLICATION OF WOUND VAC;  Surgeon: Lowery Estefana RAMAN, DO;  Location: [facility] OR;  Service: Plastics;  Laterality: N/A;   BREAST SURGERY Left 2001   for CA   CARDIOVERSION N/A 02/26/2022   Procedure: CARDIOVERSION;  Surgeon: Kate Lonni CROME, MD;  Location: [facility] ENDOSCOPY;  Service: Cardiovascular;  Laterality: N/A;   COLONOSCOPY WITH PROPOFOL  N/A 06/07/2018   Procedure: COLONOSCOPY WITH PROPOFOL ;  Surgeon: Dianna Specking, MD;  Location: [facility] ENDOSCOPY;  Service: Endoscopy;  Laterality: N/A;   DEBRIDEMENT AND CLOSURE WOUND Left 12/28/2018   Procedure: Debridement And Closure Wound;  Surgeon: Vernetta Berg, MD;  Location: [facility] OR;  Service: General;  Laterality: Left;   INCISION AND DRAINAGE OF WOUND Left 01/27/2019   Procedure: IRRIGATION AND DEBRIDEMENT OF CHEST WALL;  Surgeon: Lowery Estefana RAMAN, DO;  Location: [facility] OR;  Service: Plastics;  Laterality: Left;   INTRAMEDULLARY (IM) NAIL INTERTROCHANTERIC N/A 09/23/2022   Procedure: INTRAMEDULLARY (IM) NAIL INTERTROCHANTERIC;  Surgeon: Jerri Kay HERO, MD;  Location: [facility] OR;  Service: Orthopedics;  Laterality: N/A;   KNEE ARTHROSCOPY WITH MEDIAL  MENISECTOMY Left 08/06/2021   Procedure: KNEE ARTHROSCOPY WITH PARTIAL MEDIAL MENISECTOMY, DEBRIDEMENT;  Surgeon: Duwayne Purchase, MD;  Location: [facility] ORS;  Service: Orthopedics;  Laterality: Left;  1 HR   LATISSIMUS FLAP TO BREAST Left 01/18/2019   Procedure: LEFT LATISSIMUS FLAP TO BREAST;  Surgeon: Lowery Estefana RAMAN, DO;  Location: [facility] OR;  Service: Plastics;  Laterality: Left;   MASTECTOMY Left    MITRAL VALVE REPLACEMENT     mechanical   POLYPECTOMY  06/07/2018   Procedure: POLYPECTOMY;  Surgeon: Dianna Specking, MD;  Location: [facility] ENDOSCOPY;  Service: Endoscopy;;   RIGHT/LEFT HEART CATH AND CORONARY ANGIOGRAPHY N/A 01/09/2021   Procedure: RIGHT/LEFT HEART CATH AND CORONARY ANGIOGRAPHY;  Surgeon: Cherrie Toribio SAUNDERS, MD;  Location: [facility] INVASIVE CV LAB;  Service: Cardiovascular;  Laterality: N/A;   WOUND DEBRIDEMENT Left 12/28/2018   Procedure: EXCISION OF LEFT CHRONIC CHEST WALL WOUND;  Surgeon: Vernetta Berg, MD;  Location: [facility] OR;  Service: General;  Laterality: Left;     Home Medications:  Prior to Admission medications   Medication Sig Start Date End Date Taking? Authorizing Provider  albuterol  (VENTOLIN  HFA) 108 (90 Base) MCG/ACT inhaler INHALE 2 PUFFS BY MOUTH EVERY 6 HOURS AS NEEDED FOR WHEEZING FOR SHORTNESS OF BREATH 07/15/23  Yes Amoako, Prince, MD  colchicine  0.6 MG tablet Take 1 tablet (0.6 mg total) by mouth daily. 07/08/23  Yes Rodriguez-Southworth, Kyra, PA-C  FARXIGA  10 MG TABS tablet Take 1 tablet by mouth once daily 06/03/23  Yes Amoako, Prince, MD  fluticasone  (FLONASE ) 50 MCG/ACT nasal spray Place 2 sprays into both nostrils daily. 05/17/23 05/16/24 Yes Amoako, Prince, MD  gabapentin  (NEURONTIN ) 800 MG tablet Take 1 tablet (800 mg total) by mouth 3 (three) times daily. 03/01/23  Yes Lovorn, Megan, MD  HYDROcodone -acetaminophen  (NORCO/VICODIN) 5-325 MG tablet Take 1 tablet by mouth every 4 (four) hours as needed for moderate pain (pain score 4-6). 07/08/23  Yes Lovorn,  Megan, MD  metoprolol  succinate (TOPROL -XL) 25 MG 24 hr tablet Take 1 tablet (25 mg total) by mouth daily. 12/03/22  Yes Sharl Gee, MD  pantoprazole  (PROTONIX ) 40 MG tablet Take 1 tablet (40 mg total) by mouth daily. 04/16/23  Yes Arellano Zameza, Priscila, MD  potassium chloride  (KLOR-CON  M) 10 MEQ tablet Take 4 tablets (40 mEq total) by mouth daily. 01/19/23 08/17/23 Yes Milford, Harlene HERO, FNP  simvastatin  (ZOCOR ) 20 MG tablet Take 1 tablet (20 mg total) by mouth daily. 12/03/22  Yes Sharl Gee, MD  torsemide  (DEMADEX ) 20 MG tablet Take 3 tablets (60 mg total) by mouth daily. 01/21/23  Yes Glena Harlene HERO, FNP  TRELEGY ELLIPTA  100-62.5-25 MCG/ACT AEPB INHALE 1 PUFF ONCE DAILY INTO  THE  LUNGS 07/15/23  Yes Amoako, Prince, MD  warfarin (COUMADIN ) 5 MG tablet Take one-and-one-half (1 & 1/2) of your 5 mg peach colored warfarin tablets on Mondays and Thursdays. All other days, take only one (1) tablet. 04/26/23  Yes Ruthell Lynwood NOVAK, RPH-CPP  DULoxetine  (CYMBALTA ) 20 MG capsule Take 20 mg by mouth at bedtime. As needed Patient not  taking: Reported on 07/29/2023    [provider]  losartan  (COZAAR ) 25 MG tablet Take 1/2 (one-half) tablet by mouth once daily Patient not taking: Reported on 07/29/2023 02/26/23   Bensimhon, Toribio SAUNDERS, MD  predniSONE  (DELTASONE ) 10 MG tablet Take 10 mg by mouth See admin instructions. Take 6 tablets by mouth once daily on day one, then 5 tabs on day two, then 4 tabs on day three, then 3 tabs on day four, then 2 tabs on day five, then 1 tab on day six, then stop. Take all doses with food. Patient not taking: Reported on 07/29/2023 09/10/22   [provider]    Inpatient Medications: Scheduled Meds:  colchicine   0.3 mg Oral Daily   fluticasone   2 spray Each Nare Daily   fluticasone  furoate-vilanterol  1 puff Inhalation Daily   And   umeclidinium bromide   1 puff Inhalation Daily   furosemide   40 mg Intravenous Once   gabapentin   800 mg Oral TID   lidocaine    1 patch Transdermal QHS   nicotine   14 mg Transdermal Daily   pantoprazole   40 mg Oral Daily   simvastatin   20 mg Oral Daily   Warfarin - Pharmacist Dosing Inpatient   Does not apply q1600   Continuous Infusions:  amiodarone  Stopped (07/30/23 0223)   amiodarone      PRN Meds: diclofenac  Sodium, HYDROcodone -acetaminophen   Allergies:    Allergies  Allergen Reactions   Lisinopril  Swelling   Acetaminophen -Codeine Itching   Propoxyphene Itching    Darvocet   Tape Rash    Plastic   Tramadol  Itching, Nausea And Vomiting and Rash    Social History:   Social History   Socioeconomic History   Marital status: Single    Spouse name: Not on file   Number of children: Not on file   Years of education: Not on file   Highest education level: Not on file  Occupational History   Not on file  Tobacco Use   Smoking status: Every Day    Current packs/day: 0.10    Average packs/day: 0.1 packs/day for 30.0 years (3.0 ttl pk-yrs)    Types: Cigarettes   Smokeless tobacco: Never   Tobacco comments:    5 cigs per day  Vaping Use   Vaping status: Never Used  Substance and Sexual Activity   Alcohol  use: Yes    Alcohol /week: 3.0 standard drinks of alcohol     Types: 3 Standard drinks or equivalent per week    Comment: beer- ocasional   Drug use: No   Sexual activity: Not Currently    Birth control/protection: None  Other Topics Concern   Not on file  Social History Narrative   ** Merged History Encounter **       Social Drivers of Health   Financial Resource Strain: Low Risk  (12/01/2022)   Overall Financial Resource Strain (CARDIA)    Difficulty of Paying Living Expenses: Not very hard  Food Insecurity: No Food Insecurity (12/01/2022)   Hunger Vital Sign    Worried About Running Out of Food in the Last Year: Never true    Ran Out of Food in the Last Year: Never true  Transportation Needs: No Transportation Needs (12/01/2022)   PRAPARE - Administrator, Civil Service  (Medical): No    Lack of Transportation (Non-Medical): No  Physical Activity: Inactive (12/01/2022)   Exercise Vital Sign    Days of Exercise per Week: 0 days    Minutes of Exercise  per Session: 10 min  Stress: Stress Concern Present (12/01/2022)   [employer] of Occupational Health - Occupational Stress Questionnaire    Feeling of Stress : To some extent  Social Connections: Socially Isolated (12/01/2022)   Social Connection and Isolation Panel [NHANES]    Frequency of Communication with Friends and Family: More than three times a week    Frequency of Social Gatherings with Friends and Family: More than three times a week    Attends Religious Services: Never    Database Administrator or Organizations: No    Attends Banker Meetings: Never    Marital Status: Never married  Intimate Partner Violence: Not At Risk (12/01/2022)   Humiliation, Afraid, Rape, and Kick questionnaire    Fear of Current or Ex-Partner: No    Emotionally Abused: No    Physically Abused: No    Sexually Abused: No    Family History:    Family History  Problem Relation Age of Onset   Hypertension Mother    Cancer Father    Hypertension Father    Breast cancer Maternal Aunt 50   Heart attack Other      ROS:  Please see the history of present illness.  All other ROS reviewed and negative.     Physical Exam/Data:   Vitals:   07/30/23 0232 07/30/23 0234 07/30/23 0238 07/30/23 0245  BP:  95/79  105/88  Pulse: 71 77 70 63  Resp: 19 (!) 35 17 19  Temp:      TempSrc:      SpO2: 98% 96% 97% 95%  Weight:      Height:        Intake/Output Summary (Last 24 hours) at 07/30/2023 0256 Last data filed at 07/30/2023 0129 Gross per 24 hour  Intake 125 ml  Output --  Net 125 ml      07/29/2023   12:52 PM 07/08/2023    3:23 PM 04/16/2023    9:20 AM  Last 3 Weights  Weight (lbs) 150 lb 160 lb 168 lb 3.2 oz  Weight (kg) 68.04 kg 72.576 kg 76.295 kg     Body mass index is 24.96 kg/m.   General:  Well nourished, well developed, in no acute distress HEENT: normal Vascular: No carotid bruits; Distal pulses 2+ bilaterally Cardiac:  normal S1, S2; RRR; loud S1, 2/6 SEM heard best at the LUSB. Lungs:  clear to auscultation bilaterally, no wheezing, rhonchi or rales  Abd: soft, nontender, no hepatomegaly  Ext: 1+ bilateral LE edema,  Musculoskeletal:  No deformities, BUE and BLE strength normal and equal Skin: warm and dry  Neuro:  no focal abnormalities noted Psych:  Normal affect   Relevant CV Studies: EKG 07/30/23 0219   EKG  07/30/23 0133 - afl with variable block    EKG 07/29/23 1550 - read a atrial tachycardia, though appears to be an atypical 2:1 AFL    07/29/23 1249  Laboratory Data:  High Sensitivity Troponin:  No results for input(s): TROPONINIHS in the last 720 hours.   Chemistry Recent Labs  Lab 07/29/23 1347  NA 136  K 3.4*  CL 103  CO2 20*  GLUCOSE 78  BUN 38*  CREATININE 1.41*  CALCIUM  8.1*  MG 1.8  GFRNONAA 39*  ANIONGAP 13   No results for input(s): PROT, ALBUMIN, AST, ALT, ALKPHOS, BILITOT in the last 168 hours. Lipids No results for input(s): CHOL, TRIG, HDL, LABVLDL, LDLCALC, CHOLHDL in the last 168 hours.  Hematology Recent  Labs  Lab 07/29/23 1805  WBC 11.1*  RBC 3.85*  HGB 11.1*  HCT 36.1  MCV 93.8  MCH 28.8  MCHC 30.7  RDW 15.0  PLT 479*   Thyroid  No results for input(s): TSH, FREET4 in the last 168 hours.  BNP Recent Labs  Lab 07/29/23 1347  BNP 374.8*    DDimer No results for input(s): DDIMER in the last 168 hours.   Radiology/Studies:  DG Chest Portable 1 View Result Date: 07/29/2023 CLINICAL DATA:  Shortness of breath and cough. EXAM: PORTABLE CHEST 1 VIEW COMPARISON:  Chest radiograph dated 11/17/2022. FINDINGS: There is shallow inspiration. Background of emphysema and interstitial coarsening. An area of airspace opacity in the left mid lung field likely corresponding to the  subpleural scarring seen on CT. No new consolidation. There is no pleural effusion or pneumothorax. There is cardiomegaly. Median sternotomy wires. No acute osseous pathology. IMPRESSION: 1. No acute cardiopulmonary process. 2. Cardiomegaly. Electronically Signed   By: Vanetta Chou M.D.   On: 07/29/2023 14:34     Assessment and Plan:  #Paroxysmal AFL/AF #Hx mechanical mitral valve replacement Patient with a history of paroxysmal AFL/AF presenting after left eye/facial swelling and subsequently developed a likely HF exacerbation iso missed doses of medications as well. She was initially treated with a diltazem drip that was discontinued iso her low EF and switched to amiodarone  with subsequent conversion into NSR. Have a lower suspicion that the amiodarone  caused her hypertension and rather think this may have been associated with the diltiazem  she had previously received; patient is now stable in NSR with HR in 60s. She had previously been on amiodarone  200mg  every day in the past, but this was discontinued due to concerns for prolonged Qtc based on prior documentation. Will discuss restarting PO amiodarone , possibly at a lower dose vs ablation. --will discuss restarting amiodarone  vs consideration of ablation in AM  --continue warfarin (INR goal 2.5-3.5); takes 7.5mg  Mon/thurs, 5mg  other days   #Acute on chronic HFrEF Exacerbation --obtain repeat TTE to reassess EF Diuresis --continue IV diuresis (hold home torsemide  60mg  qd --goal K~4, Mag~2 GDMT --hold home losartan  12.5mg  --hold home metoprolol  succinate 25mg  every day  --hold home farxiga  10mg  every day  --not on MRA at home    Risk Assessment/Risk Scores:  [place]  Heart Association (NYHA) Functional Class NYHA Class II         For questions or updates, please contact [facility] Please consult [email or web address] for contact info under    Signed, Earlene CHRISTELLA Cluster, MD  07/30/2023 2:56 AM

## 2023-07-30 NOTE — Progress Notes (Signed)
   Patient Name: Kim Anthony Date of Encounter: 07/30/2023 [facility] Cardiologist: Toribio Fuel, MD   Interval Summary  .    Wants to get more fluid off  Vital Signs .    Vitals:   07/30/23 0600 07/30/23 0630 07/30/23 0830 07/30/23 1027  BP: 115/76 95/64 116/80   Pulse: 76 72 75   Resp: 18 16 16    Temp:    98.3 F (36.8 C)  TempSrc:    Oral  SpO2: 97% 94% 96%   Weight:      Height:        Intake/Output Summary (Last 24 hours) at 07/30/2023 1134 Last data filed at 07/30/2023 0420 Gross per 24 hour  Intake 125 ml  Output 300 ml  Net -175 ml      07/29/2023   12:52 PM 07/08/2023    3:23 PM 04/16/2023    9:20 AM  Last 3 Weights  Weight (lbs) 150 lb 160 lb 168 lb 3.2 oz  Weight (kg) 68.04 kg 72.576 kg 76.295 kg      Telemetry/ECG    NSR now.  - Personally Reviewed  Physical Exam .   GEN: No acute distress.   Neck: No JVD Cardiac: S1 click. RRR, no murmurs, rubs, or gallops.  Respiratory: Clear to auscultation bilaterally. GI: Soft, nontender, non-distended  MS: No edema  Assessment & Plan .     73 year old with mechanical mitral valve with paroxysmal atrial flutterEF 35 to 40% chronic systolic heart failure on warfarin prior prolonged QT  -IV amiodarone  was administered.  She converted -P.o. amiodarone  had been discontinued in the past due to prolonged QT. -I think it would make sense for her to get back on the p.o. amiodarone  200 mg once a day.  She has not had any adverse arrhythmias as it relates to prolonged QT.  She does not tolerate atrial flutter well.  Acute on chronic systolic heart failure - Repeat echocardiogram pending - Continue with IV Lasix  diuresis.  Protuberant abdominal wall. -Holding losartan  12.5, metoprolol  25 Farxiga  10.   For questions or updates, please contact [facility] Please consult [email or web address] for contact info under        Signed, Oneil Parchment, MD

## 2023-07-30 NOTE — Progress Notes (Signed)
  Echocardiogram 2D Echocardiogram has been performed.  Fain Home RDCS 07/30/2023, 1:27 PM

## 2023-07-31 DIAGNOSIS — I4892 Unspecified atrial flutter: Secondary | ICD-10-CM | POA: Diagnosis not present

## 2023-07-31 DIAGNOSIS — Z952 Presence of prosthetic heart valve: Secondary | ICD-10-CM

## 2023-07-31 DIAGNOSIS — I50810 Right heart failure, unspecified: Secondary | ICD-10-CM

## 2023-07-31 DIAGNOSIS — I5043 Acute on chronic combined systolic (congestive) and diastolic (congestive) heart failure: Secondary | ICD-10-CM | POA: Diagnosis not present

## 2023-07-31 DIAGNOSIS — I484 Atypical atrial flutter: Secondary | ICD-10-CM

## 2023-07-31 HISTORY — DX: Right heart failure, unspecified: I50.810

## 2023-07-31 HISTORY — DX: Presence of prosthetic heart valve: Z95.2

## 2023-07-31 LAB — PROTIME-INR
INR: 2.7 — ABNORMAL HIGH (ref 0.8–1.2)
Prothrombin Time: 29.1 s — ABNORMAL HIGH (ref 11.4–15.2)

## 2023-07-31 LAB — BASIC METABOLIC PANEL
Anion gap: 14 (ref 5–15)
BUN: 38 mg/dL — ABNORMAL HIGH (ref 8–23)
CO2: 15 mmol/L — ABNORMAL LOW (ref 22–32)
Calcium: 8 mg/dL — ABNORMAL LOW (ref 8.9–10.3)
Chloride: 103 mmol/L (ref 98–111)
Creatinine, Ser: 1.14 mg/dL — ABNORMAL HIGH (ref 0.44–1.00)
GFR, Estimated: 51 mL/min — ABNORMAL LOW (ref >60)
Glucose, Bld: 122 mg/dL — ABNORMAL HIGH (ref 70–99)
Potassium: 4.8 mmol/L (ref 3.5–5.1)
Sodium: 132 mmol/L — ABNORMAL LOW (ref 135–145)

## 2023-07-31 LAB — MAGNESIUM: Magnesium: 2.2 mg/dL (ref 1.7–2.4)

## 2023-07-31 MED ORDER — POLYETHYLENE GLYCOL 3350 17 G PO PACK
17.0000 g | PACK | Freq: Every day | ORAL | Status: DC
  Administered 2023-07-31 – 2023-08-01 (×2): 17 g via ORAL
  Filled 2023-07-31 (×2): qty 1

## 2023-07-31 MED ORDER — ACETAMINOPHEN 325 MG PO TABS: 650.0000 mg | ORAL_TABLET | Freq: Four times a day (QID) | ORAL | Status: DC | PRN

## 2023-07-31 MED ORDER — HYDROMORPHONE HCL 1 MG/ML IJ SOLN
0.5000 mg | Freq: Once | INTRAMUSCULAR | Status: AC
  Administered 2023-07-31: 0.5 mg via INTRAVENOUS
  Filled 2023-07-31: qty 0.5

## 2023-07-31 MED ORDER — CAPSAICIN 0.025 % EX CREA: TOPICAL_CREAM | Freq: Two times a day (BID) | CUTANEOUS | Status: DC

## 2023-07-31 MED ORDER — CAPSAICIN 0.025 % EX CREA: TOPICAL_CREAM | Freq: Two times a day (BID) | CUTANEOUS | Status: DC | PRN

## 2023-07-31 MED ORDER — FUROSEMIDE 10 MG/ML IJ SOLN
80.0000 mg | Freq: Once | INTRAMUSCULAR | Status: AC
  Administered 2023-07-31: 80 mg via INTRAVENOUS
  Filled 2023-07-31: qty 8

## 2023-07-31 MED ORDER — WARFARIN SODIUM 5 MG PO TABS
5.0000 mg | ORAL_TABLET | Freq: Once | ORAL | Status: AC
  Administered 2023-07-31: 5 mg via ORAL
  Filled 2023-07-31: qty 1

## 2023-07-31 MED ORDER — METOPROLOL TARTRATE 25 MG PO TABS
25.0000 mg | ORAL_TABLET | Freq: Two times a day (BID) | ORAL | Status: DC
  Administered 2023-07-31 – 2023-08-01 (×3): 25 mg via ORAL
  Filled 2023-07-31 (×3): qty 1

## 2023-07-31 MED ORDER — CAPSAICIN 0.025 % EX CREA
TOPICAL_CREAM | Freq: Two times a day (BID) | CUTANEOUS | Status: DC
  Filled 2023-07-31: qty 60

## 2023-07-31 MED ORDER — HYDROMORPHONE HCL 1 MG/ML IJ SOLN
0.5000 mg | Freq: Once | INTRAMUSCULAR | Status: AC | PRN
  Administered 2023-07-31: 0.5 mg via INTRAVENOUS
  Filled 2023-07-31: qty 0.5

## 2023-07-31 NOTE — Plan of Care (Signed)

## 2023-07-31 NOTE — Evaluation (Signed)
 Physical Therapy Evaluation Patient Details Name: Kim Anthony [name]: [id number] DOB: [birth year] Today's Date: 07/31/2023  History of Present Illness  Pt is 73 yo presenting [facility] ED on 2/6 due to swelling of bil eyes and feet. Pt found to be tachycardic. PMH: COPD, EFrEF, mitral valve replacement, paroxysmal afib/Aflutter.  Clinical Impression  Pt is presenting below baseline level of functioning. Currently pt was limited by fatigue and weakness to 5 ft of gait at Min A. Min A for sit to stand and was Mod I for bed mobility. Pt daughter is available to assist PRN. Due to pt current functional status, home set up and available assistance at home recommending skilled physical therapy services 3x/week in order to address strength, balance and functional mobility to decrease risk for falls, injury and re-hospitalization.           If plan is discharge home, recommend the following: A little help with walking and/or transfers;Assistance with cooking/housework;Assist for transportation;Help with stairs or ramp for entrance     Equipment Recommendations None recommended by PT     Functional Status Assessment Patient has had a recent decline in their functional status and demonstrates the ability to make significant improvements in function in a reasonable and predictable amount of time.     Precautions / Restrictions Precautions Precautions: Fall      Mobility  Bed Mobility Overal bed mobility: Modified Independent      Transfers Overall transfer level: Needs assistance Equipment used: Rolling walker (2 wheels) Transfers: Sit to/from Stand Sit to Stand: Min assist, Contact guard assist           General transfer comment: Min A to get momentum to get to standing, then steadying assist briefly with RW. Improved to CGA for second attempt. Verbal cues for safety with hand placement.    Ambulation/Gait Ambulation/Gait assistance: Min assist Gait Distance (Feet): 5 Feet Assistive  device: Rolling walker (2 wheels) Gait Pattern/deviations: Step-through pattern, Decreased step length - right, Decreased step length - left Gait velocity: decreased Gait velocity interpretation: <1.31 ft/sec, indicative of household ambulator   General Gait Details: pt took a couple of steps and then had to sit due to fatigue and shortness of breathe. Unable to progress further. Pt on 2 L o2 via [place]. Safety cues to prevent quick sitting and verbal cues for proximity to RW.     Balance Overall balance assessment: Needs assistance Sitting-balance support: No upper extremity supported, Feet supported Sitting balance-Leahy Scale: Good     Standing balance support: Bilateral upper extremity supported, During functional activity, Reliant on assistive device for balance Standing balance-Leahy Scale: Fair Standing balance comment: no overt LOB         Pertinent Vitals/Pain Pain Assessment Pain Assessment: No/denies pain    Home Living Family/patient expects to be discharged to:: Private residence Living Arrangements: Children (lives with daughter) Available Help at Discharge: Family;Available PRN/intermittently Type of Home: House Home Access: Stairs to enter Entrance Stairs-Rails: Right;Left;Can reach both Entrance Stairs-Number of Steps: 8   Home Layout: One level Home Equipment: Rollator (4 wheels);Rolling Walker (2 wheels);Cane - single point;BSC/3in1;Shower seat;Grab bars - tub/shower      Prior Function Prior Level of Function : Independent/Modified Independent;Driving     Mobility Comments: mod I with occasional use of cane ADLs Comments: mod I for ADLs and IADL's.     Extremity/Trunk Assessment   Upper Extremity Assessment Upper Extremity Assessment: Generalized weakness    Lower Extremity Assessment Lower Extremity Assessment: Generalized weakness  Cervical / Trunk Assessment Cervical / Trunk Assessment: Normal  Communication    Communication Communication: No apparent difficulties  Cognition Arousal: Alert Behavior During Therapy: WFL for tasks assessed/performed Overall Cognitive Status: Within Functional Limits for tasks assessed        General Comments General comments (skin integrity, edema, etc.): no noted skin issues outside of gown.        Assessment/Plan    PT Assessment Patient needs continued PT services  PT Problem List Decreased balance;Cardiopulmonary status limiting activity;Decreased activity tolerance;Decreased mobility;Decreased safety awareness       PT Treatment Interventions DME instruction;Therapeutic activities;Gait training;Balance training;Stair training;Functional mobility training;Therapeutic exercise;Patient/family education    PT Goals (Current goals can be found in the Care Plan section)  Acute Rehab PT Goals Patient Stated Goal: to return home PT Goal Formulation: With patient Time For Goal Achievement: 08/14/23 Potential to Achieve Goals: Good    Frequency Min 1X/week        AM-PAC PT 6 Clicks Mobility  Outcome Measure Help needed turning from your back to your side while in a flat bed without using bedrails?: None Help needed moving from lying on your back to sitting on the side of a flat bed without using bedrails?: None Help needed moving to and from a bed to a chair (including a wheelchair)?: A Little Help needed standing up from a chair using your arms (e.g., wheelchair or bedside chair)?: A Little   Help needed climbing 3-5 steps with a railing? : A Lot 6 Click Score: 16    End of Session Equipment Utilized During Treatment: Gait belt Activity Tolerance: Patient limited by fatigue Patient left: in bed;with call bell/phone within reach;with bed alarm set Nurse Communication: Mobility status PT Visit Diagnosis: Other abnormalities of gait and mobility (R26.89);Muscle weakness (generalized) (M62.81)    Time: 9058-9041 PT Time Calculation (min) (ACUTE  ONLY): 17 min   Charges:   PT Evaluation $PT Eval Low Complexity: 1 Low   PT General Charges $$ ACUTE PT VISIT: 1 Visit         Dorothyann Maier, DPT, CLT  Acute Rehabilitation Services Office: [phone] (Secure chat preferred)   Dorothyann VEAR Maier 07/31/2023, 3:48 PM

## 2023-07-31 NOTE — Progress Notes (Addendum)
 Progress Note  Patient Name: Kim Anthony Date of Encounter: 07/31/2023  Primary Cardiologist: Toribio Fuel, MD  Subjective   No acute events overnight.  Inpatient Medications    Scheduled Meds:  amiodarone   200 mg Oral Daily   colchicine   0.3 mg Oral Daily   fluticasone   2 spray Each Nare Daily   fluticasone  furoate-vilanterol  1 puff Inhalation Daily   And   umeclidinium bromide   1 puff Inhalation Daily   gabapentin   300 mg Oral TID   lidocaine   1 patch Transdermal QHS   nicotine   14 mg Transdermal Daily   pantoprazole   40 mg Oral Daily   polyethylene glycol  17 g Oral Daily   simvastatin   20 mg Oral Daily   warfarin  5 mg Oral ONCE-1600   Warfarin - Pharmacist Dosing Inpatient   Does not apply q1600   Continuous Infusions:  PRN Meds: acetaminophen , capsaicin , diclofenac  Sodium, HYDROcodone -acetaminophen    Vital Signs    Vitals:   07/31/23 0230 07/31/23 0518 07/31/23 0717 07/31/23 1042  BP: (!) 143/83  99/68 (!) 113/99  Pulse: 91     Resp: 18  18 18   Temp: 98 F (36.7 C)  98.2 F (36.8 C) 98.2 F (36.8 C)  TempSrc: Oral  Oral Oral  SpO2: 92%  93% 93%  Weight:  79.2 kg    Height:        Intake/Output Summary (Last 24 hours) at 07/31/2023 1259 Last data filed at 07/31/2023 0400 Gross per 24 hour  Intake 507.06 ml  Output 550 ml  Net -42.94 ml   Filed Weights   07/29/23 1252 07/30/23 1724 07/31/23 0518  Weight: 68 kg 80.2 kg 79.2 kg    Telemetry     Personally reviewed.  Normal sinus rhythm  ECG    EKG showed NSR, prolonged QTc interval 529 ms  Physical Exam   GEN: No acute distress.   Neck: JVD unable to examine Cardiac: RRR, no murmur, rub, or gallop.  Respiratory: Nonlabored. Clear to auscultation bilaterally. GI: Abdomen distention present MS: No edema; No deformity. Neuro:  Nonfocal. Psych: Alert and oriented x 3. Normal affect.  Labs    Chemistry Recent Labs  Lab 07/29/23 1347 07/30/23 0350 07/31/23 0224  NA 136 135  132*  K 3.4* 3.8 4.8  CL 103 101 103  CO2 20* 19* 15*  GLUCOSE 78 103* 122*  BUN 38* 39* 38*  CREATININE 1.41* 1.33* 1.14*  CALCIUM  8.1* 8.3* 8.0*  GFRNONAA 39* 42* 51*  ANIONGAP 13 15 14      Hematology Recent Labs  Lab 07/29/23 1805 07/30/23 0350  WBC 11.1* 10.2  RBC 3.85* 4.07  HGB 11.1* 11.8*  HCT 36.1 39.5  MCV 93.8 97.1  MCH 28.8 29.0  MCHC 30.7 29.9*  RDW 15.0 15.1  PLT 479* 380    Cardiac EnzymesNo results for input(s): TROPONINIHS in the last 720 hours.  BNP Recent Labs  Lab 07/29/23 1347  BNP 374.8*     DDimerNo results for input(s): DDIMER in the last 168 hours.   Radiology    ECHOCARDIOGRAM COMPLETE Result Date: 07/30/2023    ECHOCARDIOGRAM REPORT   Patient Name:   Kim Anthony Date of Exam: 07/30/2023 Medical Rec #:  [id number]     Height:       65.0 in Accession #:    [id number]    Weight:       150.0 lb Date of Birth:  October 16, 1950  BSA:          1.750 m Patient Age:    73 years      BP:           109/74 mmHg Patient Gender: F             HR:           81 bpm. Exam Location:  Inpatient Procedure: 2D Echo, Cardiac Doppler and Color Doppler Indications:    CHF  History:        Patient has prior history of Echocardiogram examinations, most                 recent 02/19/2023.  Sonographer:    Tinnie Gosling RDCS Referring Phys: [id number] JEFFREY C HATCHER IMPRESSIONS  1. The LV endocardium is not seen well. Suspect EF ~50% . Left ventricular ejection fraction, by estimation, is 50%. The left ventricle has low normal function. Left ventricular endocardial border not optimally defined to evaluate regional wall motion. There is mild concentric left ventricular hypertrophy. Left ventricular diastolic parameters are indeterminate.  2. Right ventricular systolic function is severely reduced. The right ventricular size is severely enlarged. There is normal pulmonary artery systolic pressure. The estimated right ventricular systolic pressure is 27.4 mmHg.  3. Left atrial size  was severely dilated.  4. Right atrial size was severely dilated.  5. Mitral gradients not assessed well. No obvious mitral stenosis based on available images/data. The mitral valve has been repaired/replaced. Trivial mitral valve regurgitation. No evidence of mitral stenosis. Echo findings are consistent with normal structure and function of the mitral valve prosthesis.  6. Tricuspid valve regurgitation is moderate to severe.  7. The aortic valve is tricuspid. There is mild calcification of the aortic valve. Aortic valve regurgitation is not visualized. No aortic stenosis is present.  8. The inferior vena cava is dilated in size with <50% respiratory variability, suggesting right atrial pressure of 15 mmHg. FINDINGS  Left Ventricle: The LV endocardium is not seen well. Suspect EF ~50%. Left ventricular ejection fraction, by estimation, is 50%. The left ventricle has low normal function. Left ventricular endocardial border not optimally defined to evaluate regional wall motion. The left ventricular internal cavity size was normal in size. There is mild concentric left ventricular hypertrophy. Left ventricular diastolic parameters are indeterminate. Right Ventricle: The right ventricular size is severely enlarged. No increase in right ventricular wall thickness. Right ventricular systolic function is severely reduced. There is normal pulmonary artery systolic pressure. The tricuspid regurgitant velocity is 1.76 m/s, and with an assumed right atrial pressure of 15 mmHg, the estimated right ventricular systolic pressure is 27.4 mmHg. Left Atrium: Left atrial size was severely dilated. Right Atrium: Right atrial size was severely dilated. Pericardium: There is no evidence of pericardial effusion. Mitral Valve: Mitral gradients not assessed well. No obvious mitral stenosis based on available images/data. The mitral valve has been repaired/replaced. Trivial mitral valve regurgitation. There is a mechanical valve present  in the mitral position. Echo  findings are consistent with normal structure and function of the mitral valve prosthesis. No evidence of mitral valve stenosis. Tricuspid Valve: The tricuspid valve is normal in structure. Tricuspid valve regurgitation is moderate to severe. No evidence of tricuspid stenosis. Aortic Valve: The aortic valve is tricuspid. There is mild calcification of the aortic valve. Aortic valve regurgitation is not visualized. No aortic stenosis is present. Pulmonic Valve: The pulmonic valve was normal in structure. Pulmonic valve regurgitation is not visualized. No evidence of  pulmonic stenosis. Aorta: The aortic root is normal in size and structure. Venous: The inferior vena cava is dilated in size with less than 50% respiratory variability, suggesting right atrial pressure of 15 mmHg. IAS/Shunts: No atrial level shunt detected by color flow Doppler.  LEFT VENTRICLE PLAX 2D LVIDd:         3.80 cm   Diastology LVIDs:         2.90 cm   LV e' medial: 7.07 cm/s LV PW:         1.20 cm LV IVS:        1.10 cm LVOT diam:     2.20 cm LVOT Area:     3.80 cm  IVC IVC diam: 3.80 cm LEFT ATRIUM         Index LA diam:    4.80 cm 2.74 cm/m   AORTA Ao Root diam: 3.60 cm Ao Asc diam:  3.60 cm TRICUSPID VALVE TR Peak grad:   12.4 mmHg TR Vmax:        176.00 cm/s  SHUNTS Systemic Diam: 2.20 cm Toribio Fuel MD Electronically signed by Toribio Fuel MD Signature Date/Time: 07/30/2023/1:34:02 PM    Final    DG HIP PORT UNILAT WITH PELVIS 1V LEFT Result Date: 07/30/2023 CLINICAL DATA:  Left hip pain EXAM: DG HIP (WITH OR WITHOUT PELVIS) 1V PORT LEFT COMPARISON:  None Available. FINDINGS: Moderate left hip osteoarthrosis with narrowing of the joint space. No fracture or dislocation. Chronic displacement of the right lesser trochanter. Right femoral intramedullary nail. IMPRESSION: Moderate left hip osteoarthrosis. Electronically Signed   By: Franky Stanford M.D.   On: 07/30/2023 04:03   DG Chest Portable 1  View Result Date: 07/29/2023 CLINICAL DATA:  Shortness of breath and cough. EXAM: PORTABLE CHEST 1 VIEW COMPARISON:  Chest radiograph dated 11/17/2022. FINDINGS: There is shallow inspiration. Background of emphysema and interstitial coarsening. An area of airspace opacity in the left mid lung field likely corresponding to the subpleural scarring seen on CT. No new consolidation. There is no pleural effusion or pneumothorax. There is cardiomegaly. Median sternotomy wires. No acute osseous pathology. IMPRESSION: 1. No acute cardiopulmonary process. 2. Cardiomegaly. Electronically Signed   By: Vanetta Chou M.D.   On: 07/29/2023 14:34    Assessment & Plan    Severe RV failure Paroxysmal atrial flutter S/p mechanical mitral valve replacement History prolonged QTc interval   -Repeat EKGs on amiodarone  showed prolonged QTc interval, 510 to 529 msec and NSR. Will stop amiodarone .  Not a candidate for Tikosyn or flecainide either.  Start metoprolol  tartrate 25 mg twice daily.  Need EP input for sotalol.  Continue systemic AC with Coumadin .  Otherwise, continue IV diuresis.  Serum creatinine improved.   Signed, Diannah SHAUNNA Maywood, MD  07/31/2023, 12:59 PM

## 2023-07-31 NOTE — Progress Notes (Signed)
 PHARMACY - ANTICOAGULATION CONSULT NOTE  Pharmacy Consult for warfarin Indication:  mechanical mitral valve  Allergies  Allergen Reactions   Lisinopril  Swelling   Acetaminophen -Codeine Itching   Propoxyphene Itching    Darvocet   Tape Rash    Plastic   Tramadol  Itching, Nausea And Vomiting and Rash    Patient Measurements: Height: 5' 5 (165.1 cm) Weight: 79.2 kg (174 lb 9.7 oz) IBW/kg (Calculated) : 57  Vital Signs: Temp: 98.2 F (36.8 C) (02/08 0717) Temp Source: Oral (02/08 0717) BP: 99/68 (02/08 0717) Pulse Rate: 91 (02/08 0230)  Labs: Recent Labs    07/29/23 1347 07/29/23 1805 07/30/23 0350 07/31/23 0224 07/31/23 0759  HGB  --  11.1* 11.8*  --   --   HCT  --  36.1 39.5  --   --   PLT  --  479* 380  --   --   LABPROT 30.8*  --  29.9*  --  29.1*  INR 2.9*  --  2.8*  --  2.7*  CREATININE 1.41*  --  1.33* 1.14*  --     Estimated Creatinine Clearance: 45.7 mL/min (A) (by C-G formula based on SCr of 1.14 mg/dL (H)).   Medical History: Past Medical History:  Diagnosis Date   Arthritis    Asthma    Breast cancer (HCC) 2001   Left Breast Cancer   CHF (congestive heart failure) (HCC)    COPD (chronic obstructive pulmonary disease) (HCC)    Coronary artery disease    GERD (gastroesophageal reflux disease)    History of mitral valve replacement with mechanical valve    HLD (hyperlipidemia)    Hypertension    Pulmonary embolism (HCC) 2021    Medications:  Scheduled:   amiodarone   200 mg Oral Daily   colchicine   0.3 mg Oral Daily   fluticasone   2 spray Each Nare Daily   fluticasone  furoate-vilanterol  1 puff Inhalation Daily   And   umeclidinium bromide   1 puff Inhalation Daily   gabapentin   300 mg Oral TID   lidocaine   1 patch Transdermal QHS   nicotine   14 mg Transdermal Daily   pantoprazole   40 mg Oral Daily   polyethylene glycol  17 g Oral Daily   simvastatin   20 mg Oral Daily   warfarin  5 mg Oral ONCE-1600   Warfarin - Pharmacist Dosing  Inpatient   Does not apply q1600   Infusions:     Assessment: 73 yo F presenting with tachycardia, found to be in Afib with RVR. PMH of PE, mechanical mitral valve replacement. On warfarin PTA, last dose on 07/29/23 per med rec. Pharmacy consulted for warfarin management.  Home warfarin regimen: Patient prescribed warfarin 7.5 mg on Mon and Thurs, 5 mg all other days Per patient, she has just been taking 5 mg every day  2/7 AM update: INR 2.7 is therapeutic. CBC stable  Amiodarone  PTA but patient has not filled any prescriptions that we are able to locate since 10/24/2022. Amio gtt started, switched to PO 2/8.   Goal of Therapy:  INR 2.5- 3.5 Monitor platelets by anticoagulation protocol: Yes   Plan:  Initiate PTA dosing- Warfarin 7.5 mg on M/TH and 5 mg all other days Warfarin 5 mg 2/8 Check daily INR and weekly CBC   Signe Dawn, PharmD PGY2 Cardiology Pharmacy Resident 07/31/2023 9:06 AM  Contact: [phone] after 3 PM  Be curious, not judgmental... -Davina Sprinkles

## 2023-07-31 NOTE — Progress Notes (Signed)
 Subjective:   Summary: 73 yo F, history of COPD, HFrEF EF 35-40%, mitral valve replacement on warfarin, paroxysmal Afib/Aflutter presenting with shortness of breath, admitted for tachycardia and HFrEF exacerbation  Reports SOB improved. Still has pain of left inner thigh where she had shingles previously. Otherwise no new concerns.   Objective:  Vital signs in last 24 hours: Vitals:   07/30/23 2042 07/30/23 2300 07/31/23 0230 07/31/23 0518  BP:  113/73 (!) 143/83   Pulse: 83 73 91   Resp: 17 20 18    Temp:  98.3 F (36.8 C) 98 F (36.7 C)   TempSrc:  Oral Oral   SpO2: 93% 91% 92%   Weight:    79.2 kg  Height:       Supplemental O2: Nasal Cannula SpO2: 92 % O2 Flow Rate (L/min): 2 L/min  Physical Exam:  Constitutional: alert, laying in bed comfortably, in no acute distress  HEENT: resolving mild L periorbital edema, no discharge or crusting Cardiovascular: RRR Pulmonary/Chest: normal work of breathing on 2L [place], mild basilar crackles  Neuro: alert and oriented x 3 Skin: no acute rashes or lesions of left hip/thigh   Assessment/Plan:  73 yo F, history of COPD, HFrEF EF 35-40%, mitral valve replacement on warfarin, paroxysmal Afib/Aflutter presenting with shortness of breath, admitted for atrial flutter and HFrEF exacerbation  Paroxysmal atrial flutter Hx of Atrial fibrillation Stable, remains in NSR. HR 80s. Restarted on amiodarone  by cardiology (was d/c in past due to prolonged QT).  - Continue amiodarone  200mg  daily - Appreciate cardiology assistance   Acute on chronic biventricular heart failure HFrEF 35-40% Acute hypoxic respiratory failure, resolved Dyspnea and LE edema improving. On 2L [place] now. Repeat echo showed EF 50%, RV enlargement, replaced MV, mod-severe tricuspid regurgitation and dilated IVC. AKI resolved with diuresis. Will continue with IV diuresis.  - Increase IV lasix  to 80 mg today  - Hold losartan  and metoprolol  in setting of  low-normal BP, restart once BP tolerates - Strict I&Os and daily weights - Replete electrolytes, Goal K >4 and Mag >2 - Wean supplemental O2 as tolerated for SpO2 goal 88-92%  Hx mechanical mitral valve replacement On chronic warfarin therapy.  - Continue warfarin per pharmacy  Viral conjunctivitis, resolved Improved. HDS. Small R>L periorbital edema, no discharge or crusting. Patient feels like her symptoms have improved and are not concerning to her. No interventions required, will continue to monitor.   AKI, resolved  Creatinine improved to 1.14 after diuresis from admission 1.41, back to baseline of 1-1.1. Likely cardiorenal 2/2 acute on chronic systolic heart failure.  -Trend BMP  COPD Continue home Breo Ellipta  and Incruse Ellipta . No wheezing on exam.  - Wean oxygen  as tolerated for O2 sat goal 88-92%  Post-herpetic neuralgia Patient reports recently recovering from shingles with continued residual pain in her left groin. Hyperesthesia but no active rashes on exam still.  - Continue gabapentin  300mg  TID, titrate up to home dose once renal function improves  - Add capsaicin  cream PRN   Chronic Conditions:  Chronic low back pain: continue home Norco/Vicodin 5-325mg  q6h PRN HTN: holding home antihypertensives HLD: home simvastatin  20mg  Gout: home colchicine  0.3mg  daily GERD: home protonix  40mg  daily  Diet: Heart healthy VTE: Warfarin Code: Full  Dispo: Anticipated discharge to Home pending medical stability.   Ned Kakar, DO Internal Medicine Resident PGY-2 Pager: [phone] Please contact the on-call pager after 5 pm  and on weekends at [phone].

## 2023-08-01 ENCOUNTER — Other Ambulatory Visit: Payer: Self-pay | Admitting: Internal Medicine

## 2023-08-01 DIAGNOSIS — I4892 Unspecified atrial flutter: Secondary | ICD-10-CM | POA: Diagnosis not present

## 2023-08-01 DIAGNOSIS — I50810 Right heart failure, unspecified: Secondary | ICD-10-CM | POA: Diagnosis not present

## 2023-08-01 DIAGNOSIS — I5022 Chronic systolic (congestive) heart failure: Secondary | ICD-10-CM

## 2023-08-01 DIAGNOSIS — Z952 Presence of prosthetic heart valve: Secondary | ICD-10-CM | POA: Diagnosis not present

## 2023-08-01 LAB — CBC
HCT: 37.9 % (ref 36.0–46.0)
Hemoglobin: 11.9 g/dL — ABNORMAL LOW (ref 12.0–15.0)
MCH: 29.8 pg (ref 26.0–34.0)
MCHC: 31.4 g/dL (ref 30.0–36.0)
MCV: 94.8 fL (ref 80.0–100.0)
Platelets: 378 10*3/uL (ref 150–400)
RBC: 4 MIL/uL (ref 3.87–5.11)
RDW: 15.2 % (ref 11.5–15.5)
WBC: 10.3 10*3/uL (ref 4.0–10.5)
nRBC: 0.4 % — ABNORMAL HIGH (ref 0.0–0.2)

## 2023-08-01 LAB — PROTIME-INR
INR: 3.1 — ABNORMAL HIGH (ref 0.8–1.2)
Prothrombin Time: 32 s — ABNORMAL HIGH (ref 11.4–15.2)

## 2023-08-01 LAB — BASIC METABOLIC PANEL
Anion gap: 12 (ref 5–15)
BUN: 34 mg/dL — ABNORMAL HIGH (ref 8–23)
CO2: 20 mmol/L — ABNORMAL LOW (ref 22–32)
Calcium: 8.5 mg/dL — ABNORMAL LOW (ref 8.9–10.3)
Chloride: 101 mmol/L (ref 98–111)
Creatinine, Ser: 1 mg/dL (ref 0.44–1.00)
GFR, Estimated: 59 mL/min — ABNORMAL LOW (ref >60)
Glucose, Bld: 166 mg/dL — ABNORMAL HIGH (ref 70–99)
Potassium: 4.1 mmol/L (ref 3.5–5.1)
Sodium: 133 mmol/L — ABNORMAL LOW (ref 135–145)

## 2023-08-01 LAB — MAGNESIUM: Magnesium: 2.1 mg/dL (ref 1.7–2.4)

## 2023-08-01 MED ORDER — WARFARIN SODIUM 2.5 MG PO TABS
2.5000 mg | ORAL_TABLET | Freq: Once | ORAL | Status: DC
  Filled 2023-08-01: qty 1

## 2023-08-01 MED ORDER — METOPROLOL SUCCINATE ER 25 MG PO TB24: 25.0000 mg | ORAL_TABLET | Freq: Two times a day (BID) | ORAL | 2 refills | Status: DC

## 2023-08-01 MED ORDER — TORSEMIDE 20 MG PO TABS
60.0000 mg | ORAL_TABLET | Freq: Every day | ORAL | Status: DC
  Administered 2023-08-01: 60 mg via ORAL
  Filled 2023-08-01: qty 3

## 2023-08-01 MED ORDER — MAGNESIUM SULFATE 2 GM/50ML IV SOLN
2.0000 g | Freq: Once | INTRAVENOUS | Status: DC
  Administered 2023-08-01: 2 g via INTRAVENOUS
  Filled 2023-08-01: qty 50

## 2023-08-01 NOTE — Progress Notes (Signed)
 PHARMACY - ANTICOAGULATION CONSULT NOTE  Pharmacy Consult for warfarin Indication:  mechanical mitral valve  Allergies  Allergen Reactions   Lisinopril  Swelling   Acetaminophen -Codeine Itching   Propoxyphene Itching    Darvocet   Tape Rash    Plastic   Tramadol  Itching, Nausea And Vomiting and Rash    Patient Measurements: Height: 5' 5 (165.1 cm) Weight: 79.3 kg (174 lb 13.2 oz) IBW/kg (Calculated) : 57  Vital Signs: Temp: 97.8 F (36.6 C) (02/09 0508) Temp Source: Oral (02/09 0508) BP: 105/79 (02/09 0508) Pulse Rate: 69 (02/09 0508)  Labs: Recent Labs    07/29/23 1347 07/29/23 1805 07/30/23 0350 07/31/23 0224 07/31/23 0759  HGB  --  11.1* 11.8*  --   --   HCT  --  36.1 39.5  --   --   PLT  --  479* 380  --   --   LABPROT 30.8*  --  29.9*  --  29.1*  INR 2.9*  --  2.8*  --  2.7*  CREATININE 1.41*  --  1.33* 1.14*  --     Estimated Creatinine Clearance: 45.7 mL/min (A) (by C-G formula based on SCr of 1.14 mg/dL (H)).   Medical History: Past Medical History:  Diagnosis Date   Arthritis    Asthma    Breast cancer (HCC) 2001   Left Breast Cancer   CHF (congestive heart failure) (HCC)    COPD (chronic obstructive pulmonary disease) (HCC)    Coronary artery disease    GERD (gastroesophageal reflux disease)    History of mitral valve replacement with mechanical valve    HLD (hyperlipidemia)    Hypertension    Pulmonary embolism (HCC) 2021    Medications:  Scheduled:   capsaicin    Topical BID   colchicine   0.3 mg Oral Daily   fluticasone   2 spray Each Nare Daily   fluticasone  furoate-vilanterol  1 puff Inhalation Daily   And   umeclidinium bromide   1 puff Inhalation Daily   gabapentin   300 mg Oral TID   lidocaine   1 patch Transdermal QHS   metoprolol  tartrate  25 mg Oral BID   nicotine   14 mg Transdermal Daily   pantoprazole   40 mg Oral Daily   polyethylene glycol  17 g Oral Daily   simvastatin   20 mg Oral Daily   torsemide   60 mg Oral Daily    Warfarin - Pharmacist Dosing Inpatient   Does not apply q1600   Infusions:     Assessment: 73 yo F presenting with tachycardia, found to be in Afib with RVR. PMH of PE, mechanical mitral valve replacement. On warfarin PTA, last dose on 07/29/23 per med rec. Pharmacy consulted for warfarin management.  Home warfarin regimen: Patient prescribed warfarin 7.5 mg on Mon and Thurs, 5 mg all other days Per patient, she has just been taking 5 mg every day   INR 3.1 (+0.4) is therapeutic. CBC stable. Amiodarone  PTA but patient has not filled any prescriptions that we are able to locate since 10/24/2022. Amio gtt started, switched to PO 2/8. Patient appetite is poor with only 50% of meals eaten.  With large increase in INR and poor appetite, will reduce dose of warfarin today.  Goal of Therapy:  INR 2.5- 3.5 Monitor platelets by anticoagulation protocol: Yes   Plan:  Warfarin 2.5 mg x1 Check daily INR and CBC  Signe Dawn, PharmD PGY2 Cardiology Pharmacy Resident 08/01/2023 6:53 AM

## 2023-08-01 NOTE — Discharge Summary (Signed)
 Name: Kim Anthony [name]: [id number] DOB: [birth year] 73 y.o. PCP: Kim Homans, MD  Date of Admission: 07/29/2023 12:43 PM Date of Discharge: 08/01/23 Attending Physician: Dr. CHARLENA Eastern  Discharge Diagnosis: Principal Problem:   Acute on chronic combined systolic and diastolic heart failure (HCC) Active Problems:   Biventricular heart failure with reduced left ventricular function (HCC)   COPD (chronic obstructive pulmonary disease) (HCC)   Chronic anticoagulation   Atypical atrial flutter (HCC)   A-fib (HCC)   H/O mitral valve replacement with mechanical valve   AKI (acute kidney injury) (HCC)   Atrial fibrillation with RVR (HCC)   Acute hypoxic respiratory failure (HCC)   CKD (chronic kidney disease) stage 3, GFR 30-59 ml/min (HCC)   Gout   Post herpetic neuralgia   History of mitral valve prosthesis   Atrial flutter (HCC)   Right ventricular failure (HCC)    Discharge Medications: Allergies as of 08/01/2023       Reactions   Lisinopril  Swelling   Acetaminophen -codeine Itching   Propoxyphene Itching   Darvocet   Tape Rash   Plastic   Tramadol  Itching, Nausea And Vomiting, Rash        Medication List     PAUSE taking these medications    losartan  25 MG tablet Wait to take this until your doctor or other care provider tells you to start again. Commonly known as: COZAAR  Take 1/2 (one-half) tablet by mouth once daily       STOP taking these medications    predniSONE  10 MG tablet Commonly known as: DELTASONE        TAKE these medications    albuterol  108 (90 Base) MCG/ACT inhaler Commonly known as: VENTOLIN  HFA INHALE 2 PUFFS BY MOUTH EVERY 6 HOURS AS NEEDED FOR WHEEZING FOR SHORTNESS OF BREATH   colchicine  0.6 MG tablet Take 1 tablet (0.6 mg total) by mouth daily.   DULoxetine  20 MG capsule Commonly known as: CYMBALTA  Take 20 mg by mouth at bedtime. As needed   Farxiga  10 MG Tabs tablet Generic drug: dapagliflozin  propanediol Take 1 tablet by  mouth once daily   fluticasone  50 MCG/ACT nasal spray Commonly known as: Flonase  Place 2 sprays into both nostrils daily.   gabapentin  800 MG tablet Commonly known as: Neurontin  Take 1 tablet (800 mg total) by mouth 3 (three) times daily.   HYDROcodone -acetaminophen  5-325 MG tablet Commonly known as: NORCO/VICODIN Take 1 tablet by mouth every 4 (four) hours as needed for moderate pain (pain score 4-6).   metoprolol  succinate 25 MG 24 hr tablet Commonly known as: TOPROL -XL Take 1 tablet (25 mg total) by mouth 2 (two) times daily. What changed: when to take this   pantoprazole  40 MG tablet Commonly known as: Protonix  Take 1 tablet (40 mg total) by mouth daily.   potassium chloride  10 MEQ tablet Commonly known as: KLOR-CON  M Take 4 tablets (40 mEq total) by mouth daily.   simvastatin  20 MG tablet Commonly known as: ZOCOR  Take 1 tablet (20 mg total) by mouth daily.   torsemide  20 MG tablet Commonly known as: DEMADEX  Take 3 tablets (60 mg total) by mouth daily.   Trelegy Ellipta  100-62.5-25 MCG/ACT Aepb Generic drug: Fluticasone -Umeclidin-Vilant INHALE 1 PUFF ONCE DAILY INTO  THE  LUNGS   warfarin 5 MG tablet Commonly known as: Coumadin  Take as directed. If you are unsure how to take this medication, talk to your nurse or doctor. Original instructions: Take one-and-one-half (1 & 1/2) of your 5 mg peach colored warfarin  tablets on Mondays and Thursdays. All other days, take only one (1) tablet.        Disposition and follow-up:   [name] was discharged from [facility] in Stable condition.  At the hospital follow up visit please address:  1.  Follow-up:  HFrEF: ensure patient is euvolemic on home oral torsemide     HTN: losartan  held due to soft BP, restart if indicated   Paroxysmal Afib: metoprolol  increased to 25mg  BID at discharge. Check EKG if HR is tachycardic   2.  Labs / imaging needed at time of follow-up: none  3.  Pending labs/  test needing follow-up: none  4.  Medication Changes  Started: losartan  due to soft BP   Stopped:  Changed: metoprolol  increased to 25mg  BID  Abx -   End Date:  Follow-up Appointments:  Follow-up Information     Kim Homans, MD. Schedule an appointment as soon as possible for a visit.   Specialty: Internal Medicine Why: Please call our office to schedule hospital follow-up appt as soon as possible. Contact information: [address] [place] [address] [phone]         Bensimhon, Toribio SAUNDERS, MD. Schedule an appointment as soon as possible for a visit.   Specialty: Cardiology Why: Please schedule appt with your cardiologist (heart doctor) within 1 month. Contact information: [address] [place] [address] [phone]                 Hospital Course by problem list:    Paroxysmal atrial flutter Hx atrial fibrillation Patient presented tachycardic, found to be in atrial flutter with RVR. Treated with IV amiodarone  with good response. Cardiology was consulted who started oral amiodarone , however due to concerns of QT prolongation this was discontinued and instead home metoprolol  25mg  was increased to 25mg  BID. Patient's HR remained in normal sinus rhythm. Patient will follow up with cardiology to evaluate for recurrent atrial flutter and refer to EP to see if she will benefit from ablation. Patient was discharged home on hospital day 3.   Acute on chronic biventricular heart failure HFrEF EF 50% Acute hypoxic respiratory failure Patient presented with dyspnea and bilateral LEE, BNP 375, CXR without signs of acute cardiopulmonary process. Required 2L [place]. Echo without significant changes from previous. Patient was diuresed with IV Lasix  with good urinary output. On hospital day 3 patient was transitioned to oral Lasix . Cardiology signed off and patient was discharged home.   AKI Creatinine increased to 1.41 from baseline ~1. Likely  cardiorenal etiology 2/2 acute on chronic systolic heart failure. Creatinine improved to baseline by time of discharge.   Post-herpetic neuralgia Home gabapentin  continued at reduced dose due to decreased renal function  Hx mitral valve replacement: continued home warfarin per pharmacy COPD: Home Breo Ellipta  and Incruse Ellipta  continued Chronic low back pain: continued home Norco/Vicodin 5-325mg  q6h PRN HTN: held home antihypertensives HLD: continued home simvastatin  20mg  Gout: home colchicine  0.3mg  daily GERD: home protonix  40mg  daily  Discharge Subjective: Patient feeling well today. Denies dyspnea or chest pain. She would like to home if able.   Discharge Exam:   BP 108/87 (BP Location: Right Arm)   Pulse 65   Temp 97.9 F (36.6 C) (Oral)   Resp 18   Ht 5' 5 (1.651 m)   Wt 79.3 kg   SpO2 94%   BMI 29.09 kg/m  Constitutional: well-appearing, in no acute distress Cardiovascular: regular rate and rhythm, no m/r/g  Pulmonary/Chest: normal work of breathing on room air, lungs clear to auscultation bilaterally Abdominal: soft, non-tender, non-distended Neurological: alert & oriented x 3, 5/5 strength in bilateral upper and lower extremities Skin: warm and dry  Pertinent Labs, Studies, and Procedures:     Latest Ref Rng & Units 08/01/2023    7:59 AM 07/30/2023    3:50 AM 07/29/2023    6:05 PM  CBC  WBC 4.0 - 10.5 K/uL 10.3  10.2  11.1   Hemoglobin 12.0 - 15.0 g/dL 88.0  88.1  88.8   Hematocrit 36.0 - 46.0 % 37.9  39.5  36.1   Platelets 150 - 400 K/uL 378  380  479        Latest Ref Rng & Units 08/01/2023    9:14 AM 07/31/2023    2:24 AM 07/30/2023    3:50 AM  CMP  Glucose 70 - 99 mg/dL 833  877  896   BUN 8 - 23 mg/dL 34  38  39   Creatinine 0.44 - 1.00 mg/dL 8.99  8.85  8.66   Sodium 135 - 145 mmol/L 133  132  135   Potassium 3.5 - 5.1 mmol/L 4.1  4.8  3.8   Chloride 98 - 111 mmol/L 101  103  101   CO2 22 - 32 mmol/L 20  15  19    Calcium  8.9 - 10.3 mg/dL 8.5  8.0  8.3      ECHOCARDIOGRAM COMPLETE Result Date: 07/30/2023    ECHOCARDIOGRAM REPORT   Patient Name:   Kim Anthony Date of Exam: 07/30/2023 Medical Rec #:  [id number]     Height:       65.0 in Accession #:    [id number]    Weight:       150.0 lb Date of Birth:  [birth year]      BSA:          1.750 m Patient Age:    72 years      BP:           109/74 mmHg Patient Gender: F             HR:           81 bpm. Exam Location:  Inpatient Procedure: 2D Echo, Cardiac Doppler and Color Doppler Indications:    CHF  History:        Patient has prior history of Echocardiogram examinations, most                 recent 02/19/2023.  Sonographer:    Tinnie Gosling RDCS Referring Phys: [id number] JEFFREY C HATCHER IMPRESSIONS  1. The LV endocardium is not seen well. Suspect EF ~50% . Left ventricular ejection fraction, by estimation, is 50%. The left ventricle has low normal function. Left ventricular endocardial border not optimally defined to evaluate regional wall motion. There is mild concentric left ventricular hypertrophy. Left ventricular diastolic parameters are indeterminate.  2. Right ventricular systolic function is severely reduced. The right ventricular size is severely enlarged. There is normal pulmonary artery systolic pressure. The estimated right ventricular systolic pressure is 27.4 mmHg.  3. Left atrial size was severely dilated.  4. Right atrial size was severely dilated.  5. Mitral gradients not assessed well. No obvious mitral stenosis based on available images/data. The mitral valve has been repaired/replaced. Trivial mitral valve regurgitation. No evidence of mitral stenosis. Echo findings are consistent with normal structure and function of the mitral valve prosthesis.  6. Tricuspid valve regurgitation  is moderate to severe.  7. The aortic valve is tricuspid. There is mild calcification of the aortic valve. Aortic valve regurgitation is not visualized. No aortic stenosis is present.  8. The inferior vena cava is dilated in  size with <50% respiratory variability, suggesting right atrial pressure of 15 mmHg. FINDINGS  Left Ventricle: The LV endocardium is not seen well. Suspect EF ~50%. Left ventricular ejection fraction, by estimation, is 50%. The left ventricle has low normal function. Left ventricular endocardial border not optimally defined to evaluate regional wall motion. The left ventricular internal cavity size was normal in size. There is mild concentric left ventricular hypertrophy. Left ventricular diastolic parameters are indeterminate. Right Ventricle: The right ventricular size is severely enlarged. No increase in right ventricular wall thickness. Right ventricular systolic function is severely reduced. There is normal pulmonary artery systolic pressure. The tricuspid regurgitant velocity is 1.76 m/s, and with an assumed right atrial pressure of 15 mmHg, the estimated right ventricular systolic pressure is 27.4 mmHg. Left Atrium: Left atrial size was severely dilated. Right Atrium: Right atrial size was severely dilated. Pericardium: There is no evidence of pericardial effusion. Mitral Valve: Mitral gradients not assessed well. No obvious mitral stenosis based on available images/data. The mitral valve has been repaired/replaced. Trivial mitral valve regurgitation. There is a mechanical valve present in the mitral position. Echo  findings are consistent with normal structure and function of the mitral valve prosthesis. No evidence of mitral valve stenosis. Tricuspid Valve: The tricuspid valve is normal in structure. Tricuspid valve regurgitation is moderate to severe. No evidence of tricuspid stenosis. Aortic Valve: The aortic valve is tricuspid. There is mild calcification of the aortic valve. Aortic valve regurgitation is not visualized. No aortic stenosis is present. Pulmonic Valve: The pulmonic valve was normal in structure. Pulmonic valve regurgitation is not visualized. No evidence of pulmonic stenosis. Aorta: The  aortic root is normal in size and structure. Venous: The inferior vena cava is dilated in size with less than 50% respiratory variability, suggesting right atrial pressure of 15 mmHg. IAS/Shunts: No atrial level shunt detected by color flow Doppler.  LEFT VENTRICLE PLAX 2D LVIDd:         3.80 cm   Diastology LVIDs:         2.90 cm   LV e' medial: 7.07 cm/s LV PW:         1.20 cm LV IVS:        1.10 cm LVOT diam:     2.20 cm LVOT Area:     3.80 cm  IVC IVC diam: 3.80 cm LEFT ATRIUM         Index LA diam:    4.80 cm 2.74 cm/m   AORTA Ao Root diam: 3.60 cm Ao Asc diam:  3.60 cm TRICUSPID VALVE TR Peak grad:   12.4 mmHg TR Vmax:        176.00 cm/s  SHUNTS Systemic Diam: 2.20 cm Toribio Fuel MD Electronically signed by Toribio Fuel MD Signature Date/Time: 07/30/2023/1:34:02 PM    Final    DG HIP PORT UNILAT WITH PELVIS 1V LEFT Result Date: 07/30/2023 CLINICAL DATA:  Left hip pain EXAM: DG HIP (WITH OR WITHOUT PELVIS) 1V PORT LEFT COMPARISON:  None Available. FINDINGS: Moderate left hip osteoarthrosis with narrowing of the joint space. No fracture or dislocation. Chronic displacement of the right lesser trochanter. Right femoral intramedullary nail. IMPRESSION: Moderate left hip osteoarthrosis. Electronically Signed   By: Franky Stanford M.D.   On: 07/30/2023 04:03  DG Chest Portable 1 View Result Date: 07/29/2023 CLINICAL DATA:  Shortness of breath and cough. EXAM: PORTABLE CHEST 1 VIEW COMPARISON:  Chest radiograph dated 11/17/2022. FINDINGS: There is shallow inspiration. Background of emphysema and interstitial coarsening. An area of airspace opacity in the left mid lung field likely corresponding to the subpleural scarring seen on CT. No new consolidation. There is no pleural effusion or pneumothorax. There is cardiomegaly. Median sternotomy wires. No acute osseous pathology. IMPRESSION: 1. No acute cardiopulmonary process. 2. Cardiomegaly. Electronically Signed   By: Vanetta Chou M.D.   On: 07/29/2023  14:34     Signed: Redell Burnet, MD PGY-1 08/01/2023, 2:40 PM   Pager: [phone]

## 2023-08-01 NOTE — Progress Notes (Signed)
 Physical Therapy Treatment Patient Details Name: Kim Anthony [name]: [id number] DOB: [birth year] Today's Date: 08/01/2023   History of Present Illness Pt is 73 yo presenting to [facility] ED on 2/6 due to swelling of bil eyes and feet. Pt found to be tachycardic. PMH: COPD, EFrEF, mitral valve replacement, paroxysmal afib/Aflutter.    PT Comments  Pt is progressing towards goals. Able to increase gait distance this session. Pt was CGA for sit to stand and gait this session with RW. Pt O2 sats remained above 90% on room air. Due to pt current functional status, home set up and available assistance at home recommending skilled physical therapy services 3x/week in order to address strength, balance and functional mobility to decrease risk for falls, injury and re-hospitalization.       If plan is discharge home, recommend the following: A little help with walking and/or transfers;Assistance with cooking/housework;Assist for transportation;Help with stairs or ramp for entrance     Equipment Recommendations  None recommended by PT       Precautions / Restrictions Precautions Precautions: Fall Restrictions Weight Bearing Restrictions Per Provider Order: No     Mobility  Bed Mobility Overal bed mobility: Modified Independent        Transfers Overall transfer level: Needs assistance Equipment used: Rolling walker (2 wheels) Transfers: Sit to/from Stand Sit to Stand: Contact guard assist   General transfer comment: CGA for sit to stand using RW from EOB    Ambulation/Gait Ambulation/Gait assistance: Contact guard assist Gait Distance (Feet): 80 Feet Assistive device: Rolling walker (2 wheels) Gait Pattern/deviations: Step-through pattern, Decreased step length - right, Decreased step length - left Gait velocity: decreased Gait velocity interpretation: <1.31 ft/sec, indicative of household ambulator   General Gait Details: Pt was able to ambulate with reciprocal gait pattern with short steps  bil, slow steady pace. O2 sats remained 92-96% on room air   Stairs Stairs:  (demonstrates adequate strength with sit to stand and giat to perform stairs per home set up with minimal assistance from family)            Balance Overall balance assessment: Mild deficits observed, not formally tested Sitting-balance support: No upper extremity supported, Feet supported Sitting balance-Leahy Scale: Good     Standing balance support: Bilateral upper extremity supported, During functional activity, Reliant on assistive device for balance Standing balance-Leahy Scale: Fair Standing balance comment: no overt LOB          Cognition Arousal: Alert Behavior During Therapy: WFL for tasks assessed/performed Overall Cognitive Status: Within Functional Limits for tasks assessed                 General Comments General comments (skin integrity, edema, etc.): No noted skin issues outside of gown. Pt on room air with O2 sats 92-96%      Pertinent Vitals/Pain Pain Assessment Pain Assessment: No/denies pain Breathing: normal           PT Goals (current goals can now be found in the care plan section) Acute Rehab PT Goals Patient Stated Goal: to return home PT Goal Formulation: With patient Time For Goal Achievement: 08/14/23 Potential to Achieve Goals: Good Progress towards PT goals: Progressing toward goals    Frequency    Min 1X/week      PT Plan  Continue with current POC        AM-PAC PT 6 Clicks Mobility   Outcome Measure  Help needed turning from your back to your side while in a flat  bed without using bedrails?: None Help needed moving from lying on your back to sitting on the side of a flat bed without using bedrails?: None Help needed moving to and from a bed to a chair (including a wheelchair)?: A Little Help needed standing up from a chair using your arms (e.g., wheelchair or bedside chair)?: A Little Help needed to walk in hospital room?: A  Little Help needed climbing 3-5 steps with a railing? : A Lot 6 Click Score: 19    End of Session Equipment Utilized During Treatment: Gait belt Activity Tolerance: Patient tolerated treatment well Patient left: in bed;with call bell/phone within reach Nurse Communication: Mobility status PT Visit Diagnosis: Other abnormalities of gait and mobility (R26.89);Muscle weakness (generalized) (M62.81)     Time: 8654-8590 PT Time Calculation (min) (ACUTE ONLY): 24 min  Charges:    $Therapeutic Activity: 23-37 mins PT General Charges $$ ACUTE PT VISIT: 1 Visit                     Dorothyann Maier, DPT, CLT  Acute Rehabilitation Services Office: [phone] (Secure chat preferred)    Dorothyann VEAR Maier 08/01/2023, 4:11 PM

## 2023-08-01 NOTE — Progress Notes (Signed)
                  Subjective:   Summary: 73 yo F, history of COPD, HFrEF EF 35-40%, mitral valve replacement on warfarin, paroxysmal Afib/Aflutter presenting with shortness of breath, admitted for tachycardia and HFrEF exacerbation  Feels her dyspnea is improving, on room air now. She has been walking around her room without significant shortness of breath. Feels ready to go home whenever possible.   Objective:  Vital signs in last 24 hours: Vitals:   07/31/23 2100 07/31/23 2300 08/01/23 0508 08/01/23 0729  BP: 105/84 117/80 105/79 114/83  Pulse: 70 65 69 69  Resp: 11 18 20 20   Temp:  97.7 F (36.5 C) 97.8 F (36.6 C) 98.2 F (36.8 C)  TempSrc:  Oral Oral Oral  SpO2: 92% 92% 93% 93%  Weight:   79.3 kg   Height:       Supplemental O2: Nasal Cannula SpO2: 93 % O2 Flow Rate (L/min): 2 L/min  Physical Exam:  Constitutional: alert, laying in bed comfortably, in no acute distress  HEENT: resolving mild L periorbital edema, no discharge or crusting Cardiovascular: RRR Pulmonary/Chest: normal work of breathing on room air, mild basilar crackles  Neuro: alert and oriented x 3 Skin: no acute rashes or lesions of left hip/thigh   Assessment/Plan:  73 yo F, history of COPD, HFrEF EF 35-40%, mitral valve replacement on warfarin, paroxysmal Afib/Aflutter presenting with shortness of breath, admitted for atrial flutter and HFrEF exacerbation  Paroxysmal atrial flutter Hx of Atrial fibrillation Stable, remains in NSR. HR 80s. Amiodarone  held by cardiology due to prolonged QT. Per cardiology, will need input from EP whether sotalol will be appropriate, otherwise using beta block for rate control. Appreciate cardiology recs.  - Metoprolol  25mg  BID  Acute on chronic biventricular heart failure HFrEF 35-40% Acute hypoxic respiratory failure, resolved Dyspnea and LE edema improving. On room air now. Repeat echo showed EF 50%, RV enlargement, replaced MV, mod-severe tricuspid regurgitation  and dilated IVC. No output charted yesterday, as patient appears euvolemic today with no supplemental oxygen  requirement we will restart home diuretic dose - oral Torsemide  60mg  daily - Hold losartan  in setting of low-normal BP, restart once BP tolerates - Strict I&Os and daily weights - Wean supplemental O2 as tolerated for SpO2 goal 88-92%  Hx mechanical mitral valve replacement On chronic warfarin therapy.  - Continue warfarin per pharmacy  Post-herpetic neuralgia Patient reports recently recovering from shingles with continued residual pain in her left groin. Hyperesthesia but no active rashes on exam still.  - Continue gabapentin  300mg  TID, titrate up to home dose once renal function improves  - Add capsaicin  cream PRN   Viral conjunctivitis, resolved: small L>4 periorbital edema, improving. No acute concerns.  AKI, resolved: Cr at baseline. Likely cardiorenal etiology  Chronic Conditions:  COPD: Home Breo Ellipta  and Incruse Ellipta  continued Chronic low back pain: continue home Norco/Vicodin 5-325mg  q6h PRN HTN: holding home antihypertensives HLD: home simvastatin  20mg  Gout: home colchicine  0.3mg  daily GERD: home protonix  40mg  daily  Diet: Heart healthy VTE: Warfarin Code: Full  Dispo: Anticipated discharge to Home pending medical stability.   Redell Burnet, MD Internal Medicine Resident PGY-1 Pager: [phone] Please contact the on-call pager after 5 pm and on weekends at [phone].

## 2023-08-01 NOTE — Plan of Care (Signed)
  Problem: Education: Goal: Knowledge of General Education information will improve Description: Including pain rating scale, medication(s)/side effects and non-pharmacologic comfort measures Outcome: Progressing   Problem: Clinical Measurements: Goal: Ability to maintain clinical measurements within normal limits will improve Outcome: Progressing Goal: Will remain free from infection Outcome: Progressing Goal: Diagnostic test results will improve Outcome: Progressing Goal: Respiratory complications will improve Outcome: Progressing Goal: Cardiovascular complication will be avoided Outcome: Progressing   Problem: Activity: Goal: Risk for activity intolerance will decrease Outcome: Progressing   Problem: Pain Managment: Goal: General experience of comfort will improve and/or be controlled Outcome: Progressing   Problem: Skin Integrity: Goal: Risk for impaired skin integrity will decrease Outcome: Progressing   Problem: Safety: Goal: Ability to remain free from injury will improve Outcome: Progressing

## 2023-08-01 NOTE — Progress Notes (Signed)
 Progress Note  Patient Name: Kim Anthony Date of Encounter: 08/01/2023  Primary Cardiologist: Toribio Fuel, MD  Subjective   No acute events overnight.  No symptoms.  Inpatient Medications    Scheduled Meds:  capsaicin    Topical BID   colchicine   0.3 mg Oral Daily   fluticasone   2 spray Each Nare Daily   fluticasone  furoate-vilanterol  1 puff Inhalation Daily   And   umeclidinium bromide   1 puff Inhalation Daily   gabapentin   300 mg Oral TID   lidocaine   1 patch Transdermal QHS   metoprolol  tartrate  25 mg Oral BID   nicotine   14 mg Transdermal Daily   pantoprazole   40 mg Oral Daily   polyethylene glycol  17 g Oral Daily   simvastatin   20 mg Oral Daily   torsemide   60 mg Oral Daily   warfarin  2.5 mg Oral ONCE-1600   Warfarin - Pharmacist Dosing Inpatient   Does not apply q1600   Continuous Infusions:  PRN Meds: acetaminophen , diclofenac  Sodium, HYDROcodone -acetaminophen    Vital Signs    Vitals:   08/01/23 0508 08/01/23 0729 08/01/23 0756 08/01/23 1127  BP: 105/79 114/83  108/87  Pulse: 69 69  65  Resp: 20 20  18   Temp: 97.8 F (36.6 C) 98.2 F (36.8 C)  97.9 F (36.6 C)  TempSrc: Oral Oral  Oral  SpO2: 93% 93% 92% 94%  Weight: 79.3 kg     Height:        Intake/Output Summary (Last 24 hours) at 08/01/2023 1151 Last data filed at 07/31/2023 1900 Gross per 24 hour  Intake 480 ml  Output --  Net 480 ml   Filed Weights   07/30/23 1724 07/31/23 0518 08/01/23 0508  Weight: 80.2 kg 79.2 kg 79.3 kg    Telemetry     Personally reviewed.  Normal sinus rhythm  ECG    EKG showed NSR, prolonged QTc interval 510 ms  Physical Exam   GEN: No acute distress.   Neck: JVD unable to examine Cardiac: RRR, no murmur, rub, or gallop.  Respiratory: Nonlabored. Clear to auscultation bilaterally. GI: Abdomen distention present MS: No edema; No deformity. Neuro:  Nonfocal. Psych: Alert and oriented x 3. Normal affect.  Labs    Chemistry Recent Labs   Lab 07/30/23 0350 07/31/23 0224 08/01/23 0914  NA 135 132* 133*  K 3.8 4.8 4.1  CL 101 103 101  CO2 19* 15* 20*  GLUCOSE 103* 122* 166*  BUN 39* 38* 34*  CREATININE 1.33* 1.14* 1.00  CALCIUM  8.3* 8.0* 8.5*  GFRNONAA 42* 51* 59*  ANIONGAP 15 14 12      Hematology Recent Labs  Lab 07/29/23 1805 07/30/23 0350 08/01/23 0759  WBC 11.1* 10.2 10.3  RBC 3.85* 4.07 4.00  HGB 11.1* 11.8* 11.9*  HCT 36.1 39.5 37.9  MCV 93.8 97.1 94.8  MCH 28.8 29.0 29.8  MCHC 30.7 29.9* 31.4  RDW 15.0 15.1 15.2  PLT 479* 380 378    Cardiac EnzymesNo results for input(s): TROPONINIHS in the last 720 hours.  BNP Recent Labs  Lab 07/29/23 1347  BNP 374.8*     DDimerNo results for input(s): DDIMER in the last 168 hours.   Radiology    ECHOCARDIOGRAM COMPLETE Result Date: 07/30/2023    ECHOCARDIOGRAM REPORT   Patient Name:   Kim Anthony Date of Exam: 07/30/2023 Medical Rec #:  [id number]     Height:       65.0 in Accession #:  [id number]    Weight:       150.0 lb Date of Birth:  Jul 05, 1950      BSA:          1.750 m Patient Age:    72 years      BP:           109/74 mmHg Patient Gender: F             HR:           81 bpm. Exam Location:  Inpatient Procedure: 2D Echo, Cardiac Doppler and Color Doppler Indications:    CHF  History:        Patient has prior history of Echocardiogram examinations, most                 recent 02/19/2023.  Sonographer:    Tinnie Gosling RDCS Referring Phys: [id number] JEFFREY C HATCHER IMPRESSIONS  1. The LV endocardium is not seen well. Suspect EF ~50% . Left ventricular ejection fraction, by estimation, is 50%. The left ventricle has low normal function. Left ventricular endocardial border not optimally defined to evaluate regional wall motion. There is mild concentric left ventricular hypertrophy. Left ventricular diastolic parameters are indeterminate.  2. Right ventricular systolic function is severely reduced. The right ventricular size is severely enlarged. There is  normal pulmonary artery systolic pressure. The estimated right ventricular systolic pressure is 27.4 mmHg.  3. Left atrial size was severely dilated.  4. Right atrial size was severely dilated.  5. Mitral gradients not assessed well. No obvious mitral stenosis based on available images/data. The mitral valve has been repaired/replaced. Trivial mitral valve regurgitation. No evidence of mitral stenosis. Echo findings are consistent with normal structure and function of the mitral valve prosthesis.  6. Tricuspid valve regurgitation is moderate to severe.  7. The aortic valve is tricuspid. There is mild calcification of the aortic valve. Aortic valve regurgitation is not visualized. No aortic stenosis is present.  8. The inferior vena cava is dilated in size with <50% respiratory variability, suggesting right atrial pressure of 15 mmHg. FINDINGS  Left Ventricle: The LV endocardium is not seen well. Suspect EF ~50%. Left ventricular ejection fraction, by estimation, is 50%. The left ventricle has low normal function. Left ventricular endocardial border not optimally defined to evaluate regional wall motion. The left ventricular internal cavity size was normal in size. There is mild concentric left ventricular hypertrophy. Left ventricular diastolic parameters are indeterminate. Right Ventricle: The right ventricular size is severely enlarged. No increase in right ventricular wall thickness. Right ventricular systolic function is severely reduced. There is normal pulmonary artery systolic pressure. The tricuspid regurgitant velocity is 1.76 m/s, and with an assumed right atrial pressure of 15 mmHg, the estimated right ventricular systolic pressure is 27.4 mmHg. Left Atrium: Left atrial size was severely dilated. Right Atrium: Right atrial size was severely dilated. Pericardium: There is no evidence of pericardial effusion. Mitral Valve: Mitral gradients not assessed well. No obvious mitral stenosis based on available  images/data. The mitral valve has been repaired/replaced. Trivial mitral valve regurgitation. There is a mechanical valve present in the mitral position. Echo  findings are consistent with normal structure and function of the mitral valve prosthesis. No evidence of mitral valve stenosis. Tricuspid Valve: The tricuspid valve is normal in structure. Tricuspid valve regurgitation is moderate to severe. No evidence of tricuspid stenosis. Aortic Valve: The aortic valve is tricuspid. There is mild calcification of the aortic valve. Aortic valve regurgitation is not visualized.  No aortic stenosis is present. Pulmonic Valve: The pulmonic valve was normal in structure. Pulmonic valve regurgitation is not visualized. No evidence of pulmonic stenosis. Aorta: The aortic root is normal in size and structure. Venous: The inferior vena cava is dilated in size with less than 50% respiratory variability, suggesting right atrial pressure of 15 mmHg. IAS/Shunts: No atrial level shunt detected by color flow Doppler.  LEFT VENTRICLE PLAX 2D LVIDd:         3.80 cm   Diastology LVIDs:         2.90 cm   LV e' medial: 7.07 cm/s LV PW:         1.20 cm LV IVS:        1.10 cm LVOT diam:     2.20 cm LVOT Area:     3.80 cm  IVC IVC diam: 3.80 cm LEFT ATRIUM         Index LA diam:    4.80 cm 2.74 cm/m   AORTA Ao Root diam: 3.60 cm Ao Asc diam:  3.60 cm TRICUSPID VALVE TR Peak grad:   12.4 mmHg TR Vmax:        176.00 cm/s  SHUNTS Systemic Diam: 2.20 cm Toribio Fuel MD Electronically signed by Toribio Fuel MD Signature Date/Time: 07/30/2023/1:34:02 PM    Final     Assessment & Plan    Severe RV failure Paroxysmal atrial flutter S/p mechanical mitral valve replacement History prolonged QTc interval   -Appears to be compensated, agree with switching to p.o. torsemide  60 mg once daily. Serum creatinine significantly improved. Repeat EKGs on amiodarone  showed prolonged QTc interval, 510 to 529 msec.  Discontinued amiodarone   yesterday due to concern for prolonged QTc and resultant arrhythmias when she is discharged.  KG today showed NSR, QTc interval 510 msec. her prior QTc interval was 610 msec in 2023 and hence amiodarone  was discontinued. Curb sided with EP. Will evaluate for any recurrent atrial flutter in the outpatient setting off Amiodarone  and refer to EP to see if she will benefit from ablation.  I do not think if she is a candidate for Tikosyn, sotalol due to concern for QTc prolongation, not a candidate for flecainide due to CAD.  Will continue metoprolol  tartrate 25 mg twice daily and systemic AC with Coumadin .     [facility] will sign off.   Medication Recommendations: Continue above medications Other recommendations (labs, testing, etc): None Follow up as an outpatient: Outpatient follow-up in 1 month    Signed, Tasia Liz P Tedd Cottrill, MD  08/01/2023, 11:51 AM

## 2023-08-01 NOTE — Discharge Instructions (Signed)
 [name], Kim Anthony were hospitalized for an exacerbation of your heart failure, and a very fast heart rate. Both of these have improved and I feel comfortable discharging you home. Thank you for allowing us  to be part of your care.   Please make an appointment with the internal medicine clinic within 1-2 weeks.   Please note these changes made to your medications:   *Please START taking:  Metoprolol  25mg  twice per day  *Please STOP taking:  Losartan  until your primary care doctor restarts it  Please make sure to return to the hospital if you have worsening shortness of breath, fever, or dizziness.   Please call our clinic if you have any questions or concerns, we may be able to help and keep you from a long and expensive emergency room wait. Our clinic and after hours phone number is [phone], the best time to call is Monday through Friday 9 am to 4 pm but there is always someone available 24/7 if you have an emergency. If you need medication refills please notify your pharmacy one week in advance and they will send us  a request.

## 2023-08-02 ENCOUNTER — Telehealth: Payer: Self-pay

## 2023-08-02 NOTE — Transitions of Care (Post Inpatient/ED Visit) (Signed)
 08/02/2023  Name: Kim Anthony [name]: [id number] DOB: 1951-06-10  Today's TOC FU Call Status: Today's TOC FU Call Status:: Successful TOC FU Call Completed TOC FU Call Complete Date: 08/02/23 Patient's Name and Date of Birth confirmed.  Transition Care Management Follow-up Telephone Call Date of Discharge: 08/01/23 Discharge Facility: [facility] Type of Discharge: Inpatient Admission Primary Inpatient Discharge Diagnosis:: Acute on Chronic Systolic and Diastolic Heeart Failure How have you been since you were released from the hospital?: Better (Patient notes she is feeling pretty good) Any questions or concerns?: No  Items Reviewed: Did you receive and understand the discharge instructions provided?: Yes Medications obtained,verified, and reconciled?: Yes (Medications Reviewed) Any new allergies since your discharge?: No Dietary orders reviewed?: No Do you have support at home?: Yes People in Home: child(ren), adult Name of Support/Comfort Primary Source: Monica  Medications Reviewed Today: Medications Reviewed Today     Reviewed by Irineo Manns, RN (Case Manager) on 08/02/23 at 1515  Med List Status: <None>   Medication Order Taking? Sig Documenting Provider Last Dose Status Informant  albuterol  (VENTOLIN  HFA) 108 (90 Base) MCG/ACT inhaler [id number] Yes INHALE 2 PUFFS BY MOUTH EVERY 6 HOURS AS NEEDED FOR WHEEZING FOR SHORTNESS OF BREATH Amoako, Prince, MD Taking Active Self, Pharmacy Records  colchicine  0.6 MG tablet [id number] Yes Take 1 tablet (0.6 mg total) by mouth daily. Rodriguez-Southworth, Lamond Pilot, PA-C Taking Active Self, Pharmacy Records  DULoxetine  (CYMBALTA ) 20 MG capsule [id number] No Take 20 mg by mouth at bedtime. As needed  Patient not taking: Reported on 07/29/2023   [provider] Not Taking Active Self, Pharmacy Records           Med Note Cornelia Dieter, SEBASTIAN   Thu Jul 29, 2023  8:58 PM) Patient unsure of why she no longer takes this medication   FARXIGA  10 MG TABS tablet [id number] Yes Take 1 tablet by mouth once daily Amoako, Prince, MD Taking Active Self, Pharmacy Records  fluticasone  (FLONASE ) 50 MCG/ACT nasal spray [id number] Yes Place 2 sprays into both nostrils daily. [name], MD Taking Active Self, Pharmacy Records  gabapentin  (NEURONTIN ) 800 MG tablet [id number] Yes Take 1 tablet (800 mg total) by mouth 3 (three) times daily. Lovorn, Megan, MD Taking Active Self, Pharmacy Records  HYDROcodone -acetaminophen  (NORCO/VICODIN) 5-325 MG tablet [id number] Yes Take 1 tablet by mouth every 4 (four) hours as needed for moderate pain (pain score 4-6). Lovorn, Megan, MD Taking Active Self, Pharmacy Records  losartan  (COZAAR ) 25 MG tablet [id number] No Take 1/2 (one-half) tablet by mouth once daily  Patient not taking: No sig reported   Bensimhon, Rheta Celestine, MD Not Taking Active Self, Pharmacy Records           Med Note Cornelia Dieter, SEBASTIAN   Thu Jul 29, 2023  9:07 PM) Patient unsure of why she no longer takes this medication   metoprolol  succinate (TOPROL -XL) 25 MG 24 hr tablet [id number] Yes Take 1 tablet (25 mg total) by mouth 2 (two) times daily. Jayson Michael, MD Taking Active   pantoprazole  (PROTONIX ) 40 MG tablet [id number] Yes Take 1 tablet (40 mg total) by mouth daily. Arellano Zameza, Priscila, MD Taking Active Self, Pharmacy Records  potassium chloride  (KLOR-CON  M) 10 MEQ tablet [id number] Yes Take 4 tablets (40 mEq total) by mouth daily. Milford, Arlice Bene, FNP Taking Active Self, Pharmacy Records  simvastatin  (ZOCOR ) 20 MG tablet [id number] Yes Take 1 tablet (20 mg total) by mouth daily. Allana Arabia, MD Taking Active Self, Pharmacy Records  torsemide  (  DEMADEX ) 20 MG tablet [id number] Yes Take 3 tablets (60 mg total) by mouth daily. Milford, Arlice Bene, FNP Taking Active Self, Pharmacy Records  TRELEGY ELLIPTA  100-62.5-25 MCG/ACT AEPB [id number] Yes INHALE 1 PUFF ONCE DAILY INTO  THE  LUNGS Amoako, Prince, MD Taking Active Self, Pharmacy  Records  warfarin (COUMADIN ) 5 MG tablet [id number] Yes Take one-and-one-half (1 & 1/2) of your 5 mg peach colored warfarin tablets on Mondays and Thursdays. All other days, take only one (1) tablet. Kenda Paula, RPH-CPP Taking Active Self, Pharmacy Records           Med Note Cornelia Dieter, SEBASTIAN   Thu Jul 29, 2023  9:12 PM) NOTE: Patient reports she has not been taking her warfarin as prescribed. She has been taking one 5mg  tablet of warfarin once daily.             Home Care and Equipment/Supplies: Were Home Health Services Ordered?: Yes Any new equipment or medical supplies ordered?: No  Functional Questionnaire: Do you need assistance with bathing/showering or dressing?: No Do you need assistance with meal preparation?: No Do you need assistance with eating?: No Do you have difficulty maintaining continence: No Do you need assistance with getting out of bed/getting out of a chair/moving?: No Do you have difficulty managing or taking your medications?: No  Follow up appointments reviewed: PCP Follow-up appointment confirmed?: No MD Provider Line Number:[phone] Given: No (Message sent to [facility] front desk staff requesting HFU) Specialist Hospital Follow-up appointment confirmed?: NA Do you need transportation to your follow-up appointment?: No Do you understand care options if your condition(s) worsen?: Yes-patient verbalized understanding  SDOH Interventions Today    Flowsheet Row Most Recent Value  SDOH Interventions   Food Insecurity Interventions Intervention Not Indicated  Housing Interventions Intervention Not Indicated  Transportation Interventions Intervention Not Indicated  Utilities Interventions Intervention Not Indicated      TOC Interventions Today    Flowsheet Row Most Recent Value  TOC Interventions   TOC Interventions Discussed/Reviewed TOC Interventions Discussed, TOC Interventions Reviewed, Contacted provider for patient needs  [Contacted office for  patient needing HFU]       Irineo Manns RN, BSN, CCM [place]  [facility] Manager Population Health Direct Dial: [phone]  Fax: [phone]

## 2023-08-05 ENCOUNTER — Telehealth: Payer: Self-pay

## 2023-08-05 MED ORDER — HYDROCODONE-ACETAMINOPHEN 5-325 MG PO TABS: 1.0000 | ORAL_TABLET | ORAL | 0 refills | Status: DC | PRN

## 2023-08-05 NOTE — Telephone Encounter (Signed)
Patient requesting refill on Hydrocodone to be sent to neighborhood [facility] on [address]

## 2023-08-09 ENCOUNTER — Telehealth (HOSPITAL_COMMUNITY): Payer: Self-pay

## 2023-08-09 NOTE — Telephone Encounter (Signed)
Called to confirm/remind patient of their appointment at the [facility] on 08/10/23.   Patient reminded to bring all medications and/or complete list.  Confirmed patient has transportation. Gave directions, instructed to utilize valet parking.  Confirmed appointment prior to ending call.

## 2023-08-10 ENCOUNTER — Ambulatory Visit (HOSPITAL_COMMUNITY)
Admission: RE | Admit: 2023-08-10 | Discharge: 2023-08-10 | Disposition: A | Discharge status: Home / Self Care | Payer: 59 | Source: Ambulatory Visit | Attending: Family Medicine | Admitting: Family Medicine

## 2023-08-10 ENCOUNTER — Encounter (HOSPITAL_COMMUNITY): Payer: Self-pay

## 2023-08-10 ENCOUNTER — Telehealth (HOSPITAL_COMMUNITY): Payer: Self-pay

## 2023-08-10 VITALS — BP 120/68 | HR 81 | Wt 174.6 lb

## 2023-08-10 DIAGNOSIS — Z86718 Personal history of other venous thrombosis and embolism: Secondary | ICD-10-CM | POA: Insufficient documentation

## 2023-08-10 DIAGNOSIS — Z86711 Personal history of pulmonary embolism: Secondary | ICD-10-CM | POA: Diagnosis not present

## 2023-08-10 DIAGNOSIS — F17210 Nicotine dependence, cigarettes, uncomplicated: Secondary | ICD-10-CM | POA: Diagnosis not present

## 2023-08-10 DIAGNOSIS — Z79899 Other long term (current) drug therapy: Secondary | ICD-10-CM | POA: Diagnosis not present

## 2023-08-10 DIAGNOSIS — I071 Rheumatic tricuspid insufficiency: Secondary | ICD-10-CM | POA: Diagnosis not present

## 2023-08-10 DIAGNOSIS — Z9012 Acquired absence of left breast and nipple: Secondary | ICD-10-CM | POA: Insufficient documentation

## 2023-08-10 DIAGNOSIS — I10 Essential (primary) hypertension: Secondary | ICD-10-CM | POA: Diagnosis not present

## 2023-08-10 DIAGNOSIS — I110 Hypertensive heart disease with heart failure: Secondary | ICD-10-CM | POA: Insufficient documentation

## 2023-08-10 DIAGNOSIS — Z952 Presence of prosthetic heart valve: Secondary | ICD-10-CM

## 2023-08-10 DIAGNOSIS — I2510 Atherosclerotic heart disease of native coronary artery without angina pectoris: Secondary | ICD-10-CM | POA: Diagnosis not present

## 2023-08-10 DIAGNOSIS — I081 Rheumatic disorders of both mitral and tricuspid valves: Secondary | ICD-10-CM | POA: Insufficient documentation

## 2023-08-10 DIAGNOSIS — J4489 Other specified chronic obstructive pulmonary disease: Secondary | ICD-10-CM | POA: Insufficient documentation

## 2023-08-10 DIAGNOSIS — I5022 Chronic systolic (congestive) heart failure: Secondary | ICD-10-CM | POA: Diagnosis not present

## 2023-08-10 DIAGNOSIS — J449 Chronic obstructive pulmonary disease, unspecified: Secondary | ICD-10-CM | POA: Diagnosis not present

## 2023-08-10 DIAGNOSIS — M109 Gout, unspecified: Secondary | ICD-10-CM | POA: Insufficient documentation

## 2023-08-10 DIAGNOSIS — M1A9XX0 Chronic gout, unspecified, without tophus (tophi): Secondary | ICD-10-CM | POA: Diagnosis not present

## 2023-08-10 DIAGNOSIS — Z7901 Long term (current) use of anticoagulants: Secondary | ICD-10-CM | POA: Diagnosis not present

## 2023-08-10 DIAGNOSIS — I4892 Unspecified atrial flutter: Secondary | ICD-10-CM | POA: Diagnosis not present

## 2023-08-10 DIAGNOSIS — I272 Pulmonary hypertension, unspecified: Secondary | ICD-10-CM | POA: Insufficient documentation

## 2023-08-10 DIAGNOSIS — Z853 Personal history of malignant neoplasm of breast: Secondary | ICD-10-CM | POA: Diagnosis not present

## 2023-08-10 DIAGNOSIS — Z720 Tobacco use: Secondary | ICD-10-CM

## 2023-08-10 DIAGNOSIS — R000 Tachycardia, unspecified: Secondary | ICD-10-CM | POA: Diagnosis not present

## 2023-08-10 DIAGNOSIS — Z923 Personal history of irradiation: Secondary | ICD-10-CM | POA: Diagnosis not present

## 2023-08-10 DIAGNOSIS — J385 Laryngeal spasm: Secondary | ICD-10-CM | POA: Diagnosis not present

## 2023-08-10 LAB — BASIC METABOLIC PANEL
Anion gap: 16 — ABNORMAL HIGH (ref 5–15)
BUN: 38 mg/dL — ABNORMAL HIGH (ref 8–23)
CO2: 20 mmol/L — ABNORMAL LOW (ref 22–32)
Calcium: 8.3 mg/dL — ABNORMAL LOW (ref 8.9–10.3)
Chloride: 101 mmol/L (ref 98–111)
Creatinine, Ser: 2.1 mg/dL — ABNORMAL HIGH (ref 0.44–1.00)
GFR, Estimated: 24 mL/min — ABNORMAL LOW (ref >60)
Glucose, Bld: 96 mg/dL (ref 70–99)
Potassium: 2.7 mmol/L — CL (ref 3.5–5.1)
Sodium: 137 mmol/L (ref 135–145)

## 2023-08-10 LAB — URIC ACID: Uric Acid, Serum: 17.3 mg/dL — ABNORMAL HIGH (ref 2.5–7.1)

## 2023-08-10 LAB — MAGNESIUM: Magnesium: 1.9 mg/dL (ref 1.7–2.4)

## 2023-08-10 MED ORDER — POTASSIUM CHLORIDE CRYS ER 10 MEQ PO TBCR: 60.0000 meq | EXTENDED_RELEASE_TABLET | Freq: Every day | ORAL | 6 refills | Status: DC

## 2023-08-10 MED ORDER — SPIRONOLACTONE 25 MG PO TABS: 12.5000 mg | ORAL_TABLET | Freq: Every day | ORAL | Status: DC

## 2023-08-10 MED ORDER — TORSEMIDE 100 MG PO TABS: 100.0000 mg | ORAL_TABLET | Freq: Every day | ORAL | 3 refills | Status: DC

## 2023-08-10 MED ORDER — LOSARTAN POTASSIUM 25 MG PO TABS: 12.5000 mg | ORAL_TABLET | Freq: Every day | ORAL | 3 refills | Status: DC

## 2023-08-10 NOTE — Telephone Encounter (Signed)
Patient's B-met and BNP labs has been ordered and appointment scheduled. Pt aware, agreeable, and verbalized understanding.

## 2023-08-10 NOTE — Patient Instructions (Addendum)
RESTART Potassium 60 mEq ( 6 tabs ) daily.  START Losartan 12.5 mg ( 1/2 Tab) daily.  Labs done today, your results will be available in MyChart, we will contact you for abnormal readings.  PLEASE DO YOUR SLEEP STUDY.  Wear your compression Hose.  Your physician recommends that you schedule a follow-up appointment in: 2 weeks  If you have any questions or concerns before your next appointment please send [place] a message through [place] or call our office at [phone].    TO LEAVE A MESSAGE FOR THE NURSE SELECT OPTION 2, PLEASE LEAVE A MESSAGE INCLUDING: YOUR NAME DATE OF BIRTH CALL BACK NUMBER REASON FOR CALL**this is important as we prioritize the call backs  YOU WILL RECEIVE A CALL BACK THE SAME DAY AS LONG AS YOU CALL BEFORE 4:00 PM  At the [facility], you and your health needs are our priority. As part of our continuing mission to provide you with exceptional heart care, we have created designated Provider Care Teams. These Care Teams include your primary Cardiologist (physician) and Advanced Practice Providers (APPs- Physician Assistants and Nurse Practitioners) who all work together to provide you with the care you need, when you need it.   You may see any of the following providers on your designated Care Team at your next follow up: Dr Arvilla Meres Dr Marca Ancona Dr. Dorthula Nettles Dr. Clearnce Hasten Amy Filbert Schilder, NP Robbie Lis, [place] [facility] [place], [place] Brynda Peon, NP [place] Lee, NP Karle Plumber, PharmD   Please be sure to bring in all your medications bottles to every appointment.    Thank you for choosing [facility]

## 2023-08-10 NOTE — Progress Notes (Signed)
Advanced Heart Failure Clinic Note   PCP: Kathleen Lime, MD HF Cardiologist: Dr. Gala Romney  HPI: Kim Anthony is a 73 y.o. female with a history of mechanical MVR in 2001 in [place], [place], CAD, hx breast cancer s/p left mastectomy and radiation/chemo, tobacco use, COPD, prior PE/DVT, HTN, undergoing workup for possible inflammatory arthritis with Rheumatology, and systolic CHF.   EF (2017) 45-50% (no EF on file prior to this). Had improved to 55-60% on Echo in July 2020.  She was admitted 01/05/21 with A/C CHF exacerbation and likely COPD exacerbation.Echo with EF of 35-40%, previously 55-60% on echo July 2020. No significant MR. Mildly enlarged RV. Etiology for decline in EF was not certain so she was scheduled for [facility] to rule out obstructive CAD. R/LHC showed 1v CAD with occluded mRCA and stable hemodynamics.  Echo 08/01/21 EF 50-55% RV moderately down. MVR ok Mean gradient 2.7  Admitted 8/23 w/ a/c CHF and AFL. Echo showed EF 35-40%, LV global HK, severe RV dysfunction, mean mitral valve gradient 8 mmHg, severe TR. Diuresed with IV lasix and started on oral amiodarone load. Underwent TEE showing LVEF 35-40%, global HK, hypokinetic RV poorly visualized, no thrombus in LAA. Unfortunately patient had increased secretions and laryngospams and test aborted. Later underwent DCCV from AFL to NSR. She was discharged home on amiodarone.  Admitted 9/23 with facial swelling. Resolved and referred to allergy. QTc long on ECG (QTc 410 msec on ECG 03/09/22), and amiodarone then stopped.  Follow up 9/23, NYHA II and volume up. Torsemide 20 mg daily restarted. Amiodarone restarted.  Echo 11/23 EF 35-40%, severe RV dysfunction MVR stable mean gradient 3, severe TR  RVSP 48  4/24 had mechanical fall and fractured rt femur, treated w/ IM nailing.   Admitted 2/25 with AFL with RVR. Started on dilt gtts, switched to amio gtts. She converted to NSR. Echo showed EF 50%, mild LVH, RV severely reduced, normal MV  prosthesis, moderate to severe TR. QTc lengthened (510-529 msec) and amiodarone stopped. She was discharged home, weight 174 lbs.  Today she returns for HF follow up. Overall feeling poorly. Having gout flare in wrist and knee. Face and legs are swelling. She has SOB walking short distances in the house. Denies palpitations, abnormal bleeding, CP, dizziness, or PND/Orthopnea. Appetite ok. No fever or chills. Not weighing at home. She does not know what meds she is taking. Smokes 1 cigs/day. Not urinating much on torsemide 60 daily. Daughter helps with medications.  Cardiac Studies: - Echo 2/25: EF 50%, mild LVH, RV severely reduced, normal MV prosthesis, moderate to severe TR.  - Echo 11/23: EF 35-40% Severe RV dysfunction MVR stable Mean gradient 3. Severe TR  RVSP 48  - Echo (8/23): EF 35-40%, LV global HK, severe RV dysfunction, mean mitral valve gradient 8 mmHg, severe TR.   - Echo (2/23): EF 50-55%, RV moderately down, MVR ok  - Echo (7/22): EF 35-40% (Dr. Jacqulyn Cane felt EF 45%)  - R/LHC (7/22): with 1v CAD with occluded mRCA, RA 9, PCW 14, CI 2.9.  - Echo (2020): EF 55-60%   Past Medical History:  Diagnosis Date   Arthritis    Asthma    Breast cancer (HCC) 2001   Left Breast Cancer   CHF (congestive heart failure) (HCC)    COPD (chronic obstructive pulmonary disease) (HCC)    Coronary artery disease    GERD (gastroesophageal reflux disease)    History of mitral valve replacement with mechanical valve    HLD (hyperlipidemia)  Hypertension    Pulmonary embolism (HCC) 2021   Current Outpatient Medications  Medication Sig Dispense Refill   albuterol (VENTOLIN HFA) 108 (90 Base) MCG/ACT inhaler INHALE 2 PUFFS BY MOUTH EVERY 6 HOURS AS NEEDED FOR WHEEZING FOR SHORTNESS OF BREATH 9 g 0   FARXIGA 10 MG TABS tablet Take 1 tablet by mouth once daily 90 tablet 0   fluticasone (FLONASE) 50 MCG/ACT nasal spray Place 2 sprays into both nostrils daily. 11.1 mL 3   gabapentin  (NEURONTIN) 800 MG tablet Take 1 tablet (800 mg total) by mouth 3 (three) times daily. 90 tablet 5   HYDROcodone-acetaminophen (NORCO/VICODIN) 5-325 MG tablet Take 1 tablet by mouth every 4 (four) hours as needed for moderate pain (pain score 4-6). 150 tablet 0   losartan (COZAAR) 25 MG tablet Take 0.5 tablets (12.5 mg total) by mouth daily. 45 tablet 3   metoprolol succinate (TOPROL-XL) 25 MG 24 hr tablet Take 1 tablet (25 mg total) by mouth 2 (two) times daily. 60 tablet 2   simvastatin (ZOCOR) 20 MG tablet Take 1 tablet (20 mg total) by mouth daily. 90 tablet 2   spironolactone (ALDACTONE) 25 MG tablet Take 0.5 tablets (12.5 mg total) by mouth daily.     TRELEGY ELLIPTA 100-62.5-25 MCG/ACT AEPB INHALE 1 PUFF ONCE DAILY INTO  THE  LUNGS 60 each 0   warfarin (COUMADIN) 5 MG tablet Take one-and-one-half (1 & 1/2) of your 5 mg peach colored warfarin tablets on Mondays and Thursdays. All other days, take only one (1) tablet. (Patient taking differently: Take 5 mg by mouth daily.) 32 tablet 2   colchicine 0.6 MG tablet Take 1 tablet (0.6 mg total) by mouth daily. (Patient not taking: Reported on 08/10/2023) 3 tablet 0   potassium chloride (KLOR-CON M) 10 MEQ tablet Take 6 tablets (60 mEq total) by mouth daily. 120 tablet 6   torsemide (DEMADEX) 100 MG tablet Take 1 tablet (100 mg total) by mouth daily. 90 tablet 3   No current facility-administered medications for this encounter.   Allergies  Allergen Reactions   Lisinopril Swelling   Acetaminophen-Codeine Itching   Propoxyphene Itching    Darvocet   Tape Rash    Plastic   Tramadol Itching, Nausea And Vomiting and Rash   Social History   Socioeconomic History   Marital status: Single    Spouse name: Not on file   Number of children: Not on file   Years of education: Not on file   Highest education level: Not on file  Occupational History   Not on file  Tobacco Use   Smoking status: Every Day    Current packs/day: 0.10    Average  packs/day: 0.1 packs/day for 30.0 years (3.0 ttl pk-yrs)    Types: Cigarettes   Smokeless tobacco: Never   Tobacco comments:    5 cigs per day  Vaping Use   Vaping status: Never Used  Substance and Sexual Activity   Alcohol use: Yes    Alcohol/week: 3.0 standard drinks of alcohol    Types: 3 Standard drinks or equivalent per week    Comment: beer- ocasional   Drug use: No   Sexual activity: Not Currently    Birth control/protection: None  Other Topics Concern   Not on file  Social History Narrative   ** Merged History Encounter **       Social Drivers of Health   Financial Resource Strain: Low Risk  (12/01/2022)   Overall Financial Resource Strain (CARDIA)  Difficulty of Paying Living Expenses: Not very hard  Food Insecurity: No Food Insecurity (08/02/2023)   Hunger Vital Sign    Worried About Running Out of Food in the Last Year: Never true    Ran Out of Food in the Last Year: Never true  Transportation Needs: No Transportation Needs (08/02/2023)   PRAPARE - Administrator, Civil Service (Medical): No    Lack of Transportation (Non-Medical): No  Physical Activity: Inactive (12/01/2022)   Exercise Vital Sign    Days of Exercise per Week: 0 days    Minutes of Exercise per Session: 10 min  Stress: Stress Concern Present (12/01/2022)   [employer] of Occupational Health - Occupational Stress Questionnaire    Feeling of Stress : To some extent  Social Connections: Moderately Integrated (07/30/2023)   Social Connection and Isolation Panel [NHANES]    Frequency of Communication with Friends and Family: More than three times a week    Frequency of Social Gatherings with Friends and Family: Twice a week    Attends Religious Services: 1 to 4 times per year    Active Member of [employer] or Organizations: No    Attends Banker Meetings: 1 to 4 times per year    Marital Status: Never married  Intimate Partner Violence: Not At Risk (08/02/2023)   Humiliation,  Afraid, Rape, and Kick questionnaire    Fear of Current or Ex-Partner: No    Emotionally Abused: No    Physically Abused: No    Sexually Abused: No    Family History  Problem Relation Age of Onset   Hypertension Mother    Cancer Father    Hypertension Father    Breast cancer Maternal Aunt 50   Heart attack Other    BP 120/68   Pulse 81   Wt 79.2 kg (174 lb 9.6 oz)   SpO2 93%   BMI 29.05 kg/m   Wt Readings from Last 3 Encounters:  08/10/23 79.2 kg (174 lb 9.6 oz)  08/01/23 79.3 kg (174 lb 13.2 oz)  07/08/23 72.6 kg (160 lb)   PHYSICAL EXAM: General:  NAD. No resp difficulty, arrived in [facility] HEENT: + peri orbital swelling Neck: Supple. No JVD. Cor: Regular rate & rhythm. No rubs, gallops or murmurs. + mechanical S1 Lungs: diminished in bases Abdomen: Soft, nontender, nondistended.  Extremities: No cyanosis, clubbing, rash, 2-3+ BLE pre-tibial edema Neuro: Alert & oriented x 3, moves all 4 extremities w/o difficulty. Affect pleasant.  ECG (personally reviewed): NSR 82 bpm, QTC 572 msec  ReDs reading: 42%, normal  ASSESSMENT & PLAN: Chronic Systolic Heart Failure w/ Biventricular Dysfunction/ Pulmonary HTN  - Echo (2017): EF 45-50% in setting of PE. Has mild to moderate RV dysfunction. MV prosthesis okay. - Echo (7/22): EF 35-40% this admit (Dr. Jacqulyn Cane felt EF 45%), previously 55-60% in 2020.  - R/LHC (01/09/21): with 1v CAD mRCA chronically occluded and well compensated hemodynamics with mild pulmonary HTN and normal CO - Echo (08/01/21): EF 50-55% RV moderately down. MVR ok Mean gradient 3  - Admitted 8/23 with AFL with RVR and a/c CHF. - Echo (8/23): EF 35-40%, LV global HK, severe RV dysfunction, mean mitral valve gradient 8 mmHg, severe TR. ? Tachy mediated reduction in EF. - Echo 11/23 EF 35-40% Severe RV dysfunction MVR stable Mean gradient 3. Severe TR  RVSP 48 - Echo 2/25: EF 50%, mild LVH, RV severely reduced, normal MV prosthesis, moderate to severe TR. - NYHA  III, volume overloaded  today, ReDs up at 42%  - Increase torsemide to 100 mg daily. - Restart KCL to 60 mg daily. - Restart losartan 12.5 mg daily (no ARNi with h/o angioedema with ACE-I) - Continue spironolactone 25 mg daily. - Continue Toprol XL 25 mg bid - Continue Farxiga 10 mg daily. - Suspect likely underlying OSA contributing to PH/RV failure. Also w/ known COPD. - Labs today. Repeat BMET at follow up in 2 weeks.  2. AFL, paroxysmal  - s/p TEE and DCCV 9/23 to NSR. - She was instructed to stop amio due to long QTc. Minimize QT pro-longing agents - She does not do well in AFL/AF. - Recent admit 2/25 with AFL, converted to NSR on amio, but then stopped with prolonged QTc of 510 msec - NSR on ECG today, QT/QTc 490/572 - No QTc prolonging meds on list today, reviewed personally with PharmD - Refer to EP for other AAD options vs ablation (no Tikosyn or sotalol 2/2 QTc prolongation, no flecainide with CAD). - Continue warfarin. INR followed by [name]. - She has home sleep study, has not completed. She says she will complete, I encouraged her to do so - Check BMET and Mag today  3. CAD  - 1 V CAD with occluded mid RCA on cath (7/22). - No s/s angina. - No ASA with warfarin use. - Continue beta blocker + statin.     4. Hx of mechanical mitral valve replacement: - Mechanical MVR in [place] in 2001. - Echo (7/22) mean gradient of 4 mmHg with no significant MR. - Echo (08/01/21): EF 50-55% RV moderately down. MVR ok Mean gradient 3  - Echo (8/23) mitral valve mean gradient 8 mmHg (was in AFL and volume overload) - TEE (9/23) poor images due to laryngospasm/airway issues. - Echo (11/23): EF 35-40%, severe RV dysfunction, MVR stable Mean gradient 3, Severe TR, RVSP 48 - stable on echo 2/25. - Continue Coumadin.  - Continue SBE prophylaxis.   5. Tricuspid valve regurgitation - Mild to moderate by echo 3/23. - Moderate to severe on echo 2/25 - Follow    6. Hx DVT/PE - On  Coumadin. - INRs followed by [name].   7. HTN - BP ok - Med changes as above  8. COPD with ongoing tobacco use - Continue inhalers. - Smokes 1 cigs/day. - Discussed smoking cessation.  9. Gout - check uric acid  Follow up in 2 weeks with APP. She is high risk for re-admission. I called her daughter during today's visit to confirm her medications.  Jacklynn Ganong, FNP  12:12 PM

## 2023-08-10 NOTE — Telephone Encounter (Signed)
-----   Message from Jacklynn Ganong sent at 08/10/2023  3:32 PM EST ----- K is too low. She has not been taking her KCL, we restarted this today. Please call and have her come back on Monday for a BMET and will need a  BNP as well  Her uric acid is elevated, consistent with gout flare. I will forward to her PCP for further recs

## 2023-08-10 NOTE — Progress Notes (Signed)
ReDS Vest / Clip - 08/10/23 1200       ReDS Vest / Clip   Station Marker A    Ruler Value 29.5    ReDS Value Range High volume overload    ReDS Actual Value 42

## 2023-08-14 ENCOUNTER — Emergency Department (HOSPITAL_COMMUNITY)
Admission: EM | Admit: 2023-08-14 | Discharge: 2023-08-14 | Disposition: A | Discharge status: Against Medical Advice | Payer: 59 | Attending: Emergency Medicine | Admitting: Emergency Medicine

## 2023-08-14 ENCOUNTER — Other Ambulatory Visit: Payer: Self-pay

## 2023-08-14 ENCOUNTER — Encounter (HOSPITAL_COMMUNITY): Payer: Self-pay | Admitting: *Deleted

## 2023-08-14 DIAGNOSIS — R109 Unspecified abdominal pain: Secondary | ICD-10-CM | POA: Diagnosis not present

## 2023-08-14 DIAGNOSIS — R110 Nausea: Secondary | ICD-10-CM | POA: Diagnosis not present

## 2023-08-14 DIAGNOSIS — Z5321 Procedure and treatment not carried out due to patient leaving prior to being seen by health care provider: Secondary | ICD-10-CM | POA: Insufficient documentation

## 2023-08-14 DIAGNOSIS — R059 Cough, unspecified: Secondary | ICD-10-CM | POA: Insufficient documentation

## 2023-08-14 DIAGNOSIS — R0981 Nasal congestion: Secondary | ICD-10-CM | POA: Diagnosis not present

## 2023-08-14 DIAGNOSIS — M79606 Pain in leg, unspecified: Secondary | ICD-10-CM | POA: Diagnosis not present

## 2023-08-14 DIAGNOSIS — R1030 Lower abdominal pain, unspecified: Secondary | ICD-10-CM | POA: Diagnosis not present

## 2023-08-14 LAB — RESP PANEL BY RT-PCR (RSV, FLU A&B, COVID)  RVPGX2
Influenza A by PCR: NEGATIVE
Influenza B by PCR: NEGATIVE
Resp Syncytial Virus by PCR: NEGATIVE
SARS Coronavirus 2 by RT PCR: NEGATIVE

## 2023-08-14 MED ORDER — ONDANSETRON 4 MG PO TBDP
4.0000 mg | ORAL_TABLET | Freq: Once | ORAL | Status: AC
  Administered 2023-08-14: 4 mg via ORAL
  Filled 2023-08-14: qty 1

## 2023-08-14 NOTE — ED Provider Triage Note (Cosign Needed Addendum)
 Emergency Medicine Provider Triage Evaluation Note  Kim Anthony , a 73 y.o. female  was evaluated in triage.  Pt complains of nausea, cough, congestion, leg pain.  Review of Systems  Positive:  Negative:   Physical Exam  BP (!) 143/81 (BP Location: Right Arm)   Pulse 96   Temp 98.7 F (37.1 C) (Oral)   Resp (!) 24   Ht 5\' 5"  (1.651 m)   Wt 79.2 kg   SpO2 95%   BMI 29.06 kg/m  Gen:   Awake, no distress   Resp:  Normal effort  MSK:   Moves extremities without difficulty  Other:    Medical Decision Making  Medically screening exam initiated at 5:41 PM.  Appropriate orders placed.  Kim Anthony was informed that the remainder of the evaluation will be completed by another provider, this initial triage assessment does not replace that evaluation, and the importance of remaining in the ED until their evaluation is complete.  Nausea, rhinorrhea, cough, congestion x2-3 days. Also states that her upper legs hurt BL.    Dorthy Cooler, [place] 08/14/23 1742  Patient eventually coming back to triage asking to leave. I went over patients lab results from 4 days ago and she was hypokalemic.  I educated patient and family member on the risks of not getting her potassium supplemented and the risks of not obtaining complete medical workup today to include disability and even death.  Patient verbalized understanding - but still wants to go home. She states that she is tired of "getting stuck" by needles.  Patient demonstrates capacity to make her own medical decisions at this time.  AAOx3.    Dorthy Cooler, [place] 08/14/23 [phone]

## 2023-08-14 NOTE — ED Triage Notes (Signed)
 The pt became upset today shen people around her were fighting  the police came there

## 2023-08-14 NOTE — ED Notes (Signed)
 The pt does not wish to wait for the iv to be placed she is stating the needle hurts too much  and she's ready to go

## 2023-08-14 NOTE — ED Triage Notes (Signed)
 The pt is c/o abd pain with nausea  no vomiting for 2-3 days

## 2023-08-16 ENCOUNTER — Other Ambulatory Visit (HOSPITAL_COMMUNITY): Payer: 59

## 2023-08-17 ENCOUNTER — Ambulatory Visit
Admission: EM | Admit: 2023-08-17 | Discharge: 2023-08-17 | Disposition: A | Discharge status: Other Acute Inpatient Hospital | Payer: 59 | Attending: Family Medicine | Admitting: Family Medicine

## 2023-08-17 ENCOUNTER — Inpatient Hospital Stay (HOSPITAL_COMMUNITY)
Admission: EM | Admit: 2023-08-17 | Discharge: 2023-09-07 | DRG: 917 | Disposition: A | Discharge status: Home Health | Payer: 59 | Source: Ambulatory Visit | Attending: Internal Medicine | Admitting: Internal Medicine

## 2023-08-17 ENCOUNTER — Telehealth: Payer: Self-pay

## 2023-08-17 DIAGNOSIS — R000 Tachycardia, unspecified: Secondary | ICD-10-CM | POA: Diagnosis not present

## 2023-08-17 DIAGNOSIS — I4891 Unspecified atrial fibrillation: Secondary | ICD-10-CM | POA: Diagnosis not present

## 2023-08-17 DIAGNOSIS — N950 Postmenopausal bleeding: Secondary | ICD-10-CM | POA: Diagnosis not present

## 2023-08-17 DIAGNOSIS — R6521 Severe sepsis with septic shock: Secondary | ICD-10-CM | POA: Diagnosis not present

## 2023-08-17 DIAGNOSIS — E871 Hypo-osmolality and hyponatremia: Secondary | ICD-10-CM | POA: Diagnosis not present

## 2023-08-17 DIAGNOSIS — M1A9XX0 Chronic gout, unspecified, without tophus (tophi): Secondary | ICD-10-CM | POA: Diagnosis present

## 2023-08-17 DIAGNOSIS — F05 Delirium due to known physiological condition: Secondary | ICD-10-CM | POA: Diagnosis not present

## 2023-08-17 DIAGNOSIS — K3189 Other diseases of stomach and duodenum: Secondary | ICD-10-CM | POA: Diagnosis present

## 2023-08-17 DIAGNOSIS — Z803 Family history of malignant neoplasm of breast: Secondary | ICD-10-CM

## 2023-08-17 DIAGNOSIS — R6889 Other general symptoms and signs: Secondary | ICD-10-CM | POA: Diagnosis not present

## 2023-08-17 DIAGNOSIS — I2489 Other forms of acute ischemic heart disease: Secondary | ICD-10-CM | POA: Diagnosis present

## 2023-08-17 DIAGNOSIS — J690 Pneumonitis due to inhalation of food and vomit: Secondary | ICD-10-CM | POA: Diagnosis not present

## 2023-08-17 DIAGNOSIS — N179 Acute kidney failure, unspecified: Secondary | ICD-10-CM

## 2023-08-17 DIAGNOSIS — Z9012 Acquired absence of left breast and nipple: Secondary | ICD-10-CM

## 2023-08-17 DIAGNOSIS — Z888 Allergy status to other drugs, medicaments and biological substances status: Secondary | ICD-10-CM

## 2023-08-17 DIAGNOSIS — E119 Type 2 diabetes mellitus without complications: Secondary | ICD-10-CM | POA: Diagnosis not present

## 2023-08-17 DIAGNOSIS — I480 Paroxysmal atrial fibrillation: Secondary | ICD-10-CM | POA: Diagnosis present

## 2023-08-17 DIAGNOSIS — J449 Chronic obstructive pulmonary disease, unspecified: Secondary | ICD-10-CM | POA: Diagnosis not present

## 2023-08-17 DIAGNOSIS — I2510 Atherosclerotic heart disease of native coronary artery without angina pectoris: Secondary | ICD-10-CM | POA: Diagnosis present

## 2023-08-17 DIAGNOSIS — I82C11 Acute embolism and thrombosis of right internal jugular vein: Secondary | ICD-10-CM | POA: Diagnosis not present

## 2023-08-17 DIAGNOSIS — I5082 Biventricular heart failure: Secondary | ICD-10-CM | POA: Diagnosis present

## 2023-08-17 DIAGNOSIS — I2722 Pulmonary hypertension due to left heart disease: Secondary | ICD-10-CM | POA: Diagnosis not present

## 2023-08-17 DIAGNOSIS — K7200 Acute and subacute hepatic failure without coma: Secondary | ICD-10-CM | POA: Diagnosis not present

## 2023-08-17 DIAGNOSIS — M792 Neuralgia and neuritis, unspecified: Secondary | ICD-10-CM | POA: Diagnosis present

## 2023-08-17 DIAGNOSIS — D62 Acute posthemorrhagic anemia: Secondary | ICD-10-CM | POA: Diagnosis not present

## 2023-08-17 DIAGNOSIS — I5020 Unspecified systolic (congestive) heart failure: Secondary | ICD-10-CM | POA: Diagnosis present

## 2023-08-17 DIAGNOSIS — Z952 Presence of prosthetic heart valve: Secondary | ICD-10-CM | POA: Diagnosis not present

## 2023-08-17 DIAGNOSIS — K2901 Acute gastritis with bleeding: Secondary | ICD-10-CM | POA: Diagnosis present

## 2023-08-17 DIAGNOSIS — B0229 Other postherpetic nervous system involvement: Secondary | ICD-10-CM | POA: Diagnosis not present

## 2023-08-17 DIAGNOSIS — M10031 Idiopathic gout, right wrist: Secondary | ICD-10-CM | POA: Diagnosis not present

## 2023-08-17 DIAGNOSIS — E861 Hypovolemia: Secondary | ICD-10-CM | POA: Diagnosis present

## 2023-08-17 DIAGNOSIS — D6832 Hemorrhagic disorder due to extrinsic circulating anticoagulants: Secondary | ICD-10-CM | POA: Diagnosis not present

## 2023-08-17 DIAGNOSIS — E785 Hyperlipidemia, unspecified: Secondary | ICD-10-CM | POA: Diagnosis present

## 2023-08-17 DIAGNOSIS — R578 Other shock: Secondary | ICD-10-CM | POA: Diagnosis not present

## 2023-08-17 DIAGNOSIS — D649 Anemia, unspecified: Secondary | ICD-10-CM | POA: Diagnosis present

## 2023-08-17 DIAGNOSIS — R570 Cardiogenic shock: Secondary | ICD-10-CM | POA: Diagnosis present

## 2023-08-17 DIAGNOSIS — T45515A Adverse effect of anticoagulants, initial encounter: Secondary | ICD-10-CM | POA: Diagnosis present

## 2023-08-17 DIAGNOSIS — K219 Gastro-esophageal reflux disease without esophagitis: Secondary | ICD-10-CM | POA: Diagnosis present

## 2023-08-17 DIAGNOSIS — A419 Sepsis, unspecified organism: Secondary | ICD-10-CM | POA: Diagnosis not present

## 2023-08-17 DIAGNOSIS — E876 Hypokalemia: Secondary | ICD-10-CM | POA: Diagnosis not present

## 2023-08-17 DIAGNOSIS — K5900 Constipation, unspecified: Secondary | ICD-10-CM | POA: Diagnosis present

## 2023-08-17 DIAGNOSIS — D72829 Elevated white blood cell count, unspecified: Secondary | ICD-10-CM | POA: Diagnosis not present

## 2023-08-17 DIAGNOSIS — K625 Hemorrhage of anus and rectum: Secondary | ICD-10-CM | POA: Diagnosis not present

## 2023-08-17 DIAGNOSIS — I132 Hypertensive heart and chronic kidney disease with heart failure and with stage 5 chronic kidney disease, or end stage renal disease: Secondary | ICD-10-CM | POA: Diagnosis present

## 2023-08-17 DIAGNOSIS — D72828 Other elevated white blood cell count: Secondary | ICD-10-CM | POA: Diagnosis not present

## 2023-08-17 DIAGNOSIS — M109 Gout, unspecified: Secondary | ICD-10-CM | POA: Diagnosis not present

## 2023-08-17 DIAGNOSIS — I50814 Right heart failure due to left heart failure: Secondary | ICD-10-CM | POA: Diagnosis not present

## 2023-08-17 DIAGNOSIS — K922 Gastrointestinal hemorrhage, unspecified: Secondary | ICD-10-CM | POA: Diagnosis not present

## 2023-08-17 DIAGNOSIS — I071 Rheumatic tricuspid insufficiency: Secondary | ICD-10-CM | POA: Diagnosis present

## 2023-08-17 DIAGNOSIS — I959 Hypotension, unspecified: Secondary | ICD-10-CM | POA: Diagnosis present

## 2023-08-17 DIAGNOSIS — N1831 Chronic kidney disease, stage 3a: Secondary | ICD-10-CM | POA: Diagnosis not present

## 2023-08-17 DIAGNOSIS — R1032 Left lower quadrant pain: Secondary | ICD-10-CM | POA: Diagnosis not present

## 2023-08-17 DIAGNOSIS — M545 Low back pain, unspecified: Secondary | ICD-10-CM | POA: Diagnosis present

## 2023-08-17 DIAGNOSIS — Z79899 Other long term (current) drug therapy: Secondary | ICD-10-CM

## 2023-08-17 DIAGNOSIS — E872 Acidosis, unspecified: Secondary | ICD-10-CM | POA: Diagnosis present

## 2023-08-17 DIAGNOSIS — E162 Hypoglycemia, unspecified: Secondary | ICD-10-CM | POA: Diagnosis present

## 2023-08-17 DIAGNOSIS — Z01810 Encounter for preprocedural cardiovascular examination: Secondary | ICD-10-CM | POA: Diagnosis not present

## 2023-08-17 DIAGNOSIS — I5021 Acute systolic (congestive) heart failure: Secondary | ICD-10-CM

## 2023-08-17 DIAGNOSIS — J440 Chronic obstructive pulmonary disease with acute lower respiratory infection: Secondary | ICD-10-CM | POA: Diagnosis not present

## 2023-08-17 DIAGNOSIS — Z79891 Long term (current) use of opiate analgesic: Secondary | ICD-10-CM

## 2023-08-17 DIAGNOSIS — Z86711 Personal history of pulmonary embolism: Secondary | ICD-10-CM

## 2023-08-17 DIAGNOSIS — I5022 Chronic systolic (congestive) heart failure: Secondary | ICD-10-CM | POA: Diagnosis not present

## 2023-08-17 DIAGNOSIS — Z86718 Personal history of other venous thrombosis and embolism: Secondary | ICD-10-CM

## 2023-08-17 DIAGNOSIS — G4733 Obstructive sleep apnea (adult) (pediatric): Secondary | ICD-10-CM | POA: Diagnosis present

## 2023-08-17 DIAGNOSIS — T504X5A Adverse effect of drugs affecting uric acid metabolism, initial encounter: Secondary | ICD-10-CM | POA: Diagnosis not present

## 2023-08-17 DIAGNOSIS — Z743 Need for continuous supervision: Secondary | ICD-10-CM | POA: Diagnosis not present

## 2023-08-17 DIAGNOSIS — I272 Pulmonary hypertension, unspecified: Secondary | ICD-10-CM

## 2023-08-17 DIAGNOSIS — I110 Hypertensive heart disease with heart failure: Secondary | ICD-10-CM | POA: Diagnosis not present

## 2023-08-17 DIAGNOSIS — T39311A Poisoning by propionic acid derivatives, accidental (unintentional), initial encounter: Secondary | ICD-10-CM | POA: Diagnosis not present

## 2023-08-17 DIAGNOSIS — N1832 Chronic kidney disease, stage 3b: Secondary | ICD-10-CM

## 2023-08-17 DIAGNOSIS — R197 Diarrhea, unspecified: Secondary | ICD-10-CM | POA: Diagnosis not present

## 2023-08-17 DIAGNOSIS — Z91048 Other nonmedicinal substance allergy status: Secondary | ICD-10-CM

## 2023-08-17 DIAGNOSIS — K921 Melena: Principal | ICD-10-CM

## 2023-08-17 DIAGNOSIS — I10 Essential (primary) hypertension: Secondary | ICD-10-CM | POA: Diagnosis not present

## 2023-08-17 DIAGNOSIS — E8721 Acute metabolic acidosis: Secondary | ICD-10-CM | POA: Diagnosis not present

## 2023-08-17 DIAGNOSIS — Z7951 Long term (current) use of inhaled steroids: Secondary | ICD-10-CM

## 2023-08-17 DIAGNOSIS — T380X5A Adverse effect of glucocorticoids and synthetic analogues, initial encounter: Secondary | ICD-10-CM | POA: Diagnosis not present

## 2023-08-17 DIAGNOSIS — R410 Disorientation, unspecified: Secondary | ICD-10-CM

## 2023-08-17 DIAGNOSIS — I5040 Unspecified combined systolic (congestive) and diastolic (congestive) heart failure: Secondary | ICD-10-CM

## 2023-08-17 DIAGNOSIS — R58 Hemorrhage, not elsewhere classified: Secondary | ICD-10-CM | POA: Diagnosis not present

## 2023-08-17 DIAGNOSIS — I50810 Right heart failure, unspecified: Secondary | ICD-10-CM

## 2023-08-17 DIAGNOSIS — R9431 Abnormal electrocardiogram [ECG] [EKG]: Secondary | ICD-10-CM | POA: Insufficient documentation

## 2023-08-17 DIAGNOSIS — Z8249 Family history of ischemic heart disease and other diseases of the circulatory system: Secondary | ICD-10-CM

## 2023-08-17 DIAGNOSIS — K2970 Gastritis, unspecified, without bleeding: Secondary | ICD-10-CM | POA: Diagnosis not present

## 2023-08-17 DIAGNOSIS — I82409 Acute embolism and thrombosis of unspecified deep veins of unspecified lower extremity: Secondary | ICD-10-CM | POA: Insufficient documentation

## 2023-08-17 DIAGNOSIS — Z853 Personal history of malignant neoplasm of breast: Secondary | ICD-10-CM

## 2023-08-17 DIAGNOSIS — Z7901 Long term (current) use of anticoagulants: Secondary | ICD-10-CM

## 2023-08-17 DIAGNOSIS — I4892 Unspecified atrial flutter: Secondary | ICD-10-CM | POA: Diagnosis present

## 2023-08-17 DIAGNOSIS — I509 Heart failure, unspecified: Secondary | ICD-10-CM | POA: Diagnosis not present

## 2023-08-17 DIAGNOSIS — G8929 Other chronic pain: Secondary | ICD-10-CM | POA: Diagnosis present

## 2023-08-17 DIAGNOSIS — I2582 Chronic total occlusion of coronary artery: Secondary | ICD-10-CM | POA: Diagnosis present

## 2023-08-17 DIAGNOSIS — I5043 Acute on chronic combined systolic (congestive) and diastolic (congestive) heart failure: Secondary | ICD-10-CM | POA: Diagnosis not present

## 2023-08-17 DIAGNOSIS — R42 Dizziness and giddiness: Secondary | ICD-10-CM | POA: Diagnosis not present

## 2023-08-17 DIAGNOSIS — K8020 Calculus of gallbladder without cholecystitis without obstruction: Secondary | ICD-10-CM | POA: Diagnosis present

## 2023-08-17 DIAGNOSIS — J9601 Acute respiratory failure with hypoxia: Secondary | ICD-10-CM | POA: Diagnosis not present

## 2023-08-17 DIAGNOSIS — F17210 Nicotine dependence, cigarettes, uncomplicated: Secondary | ICD-10-CM | POA: Diagnosis present

## 2023-08-17 DIAGNOSIS — I499 Cardiac arrhythmia, unspecified: Secondary | ICD-10-CM | POA: Diagnosis not present

## 2023-08-17 DIAGNOSIS — F419 Anxiety disorder, unspecified: Secondary | ICD-10-CM | POA: Diagnosis present

## 2023-08-17 DIAGNOSIS — Z885 Allergy status to narcotic agent status: Secondary | ICD-10-CM

## 2023-08-17 DIAGNOSIS — E860 Dehydration: Secondary | ICD-10-CM | POA: Diagnosis present

## 2023-08-17 DIAGNOSIS — R1030 Lower abdominal pain, unspecified: Secondary | ICD-10-CM | POA: Diagnosis not present

## 2023-08-17 LAB — CBC WITH DIFFERENTIAL/PLATELET
Abs Immature Granulocytes: 0.14 10*3/uL — ABNORMAL HIGH (ref 0.00–0.07)
Basophils Absolute: 0.1 10*3/uL (ref 0.0–0.1)
Basophils Relative: 1 %
Eosinophils Absolute: 0.3 10*3/uL (ref 0.0–0.5)
Eosinophils Relative: 4 %
HCT: 31.2 % — ABNORMAL LOW (ref 36.0–46.0)
Hemoglobin: 9.4 g/dL — ABNORMAL LOW (ref 12.0–15.0)
Immature Granulocytes: 2 %
Lymphocytes Relative: 26 %
Lymphs Abs: 2.5 10*3/uL (ref 0.7–4.0)
MCH: 28 pg (ref 26.0–34.0)
MCHC: 30.1 g/dL (ref 30.0–36.0)
MCV: 92.9 fL (ref 80.0–100.0)
Monocytes Absolute: 1.3 10*3/uL — ABNORMAL HIGH (ref 0.1–1.0)
Monocytes Relative: 13 %
Neutro Abs: 5.1 10*3/uL (ref 1.7–7.7)
Neutrophils Relative %: 54 %
Platelets: 419 10*3/uL — ABNORMAL HIGH (ref 150–400)
RBC: 3.36 MIL/uL — ABNORMAL LOW (ref 3.87–5.11)
RDW: 15.9 % — ABNORMAL HIGH (ref 11.5–15.5)
WBC: 9.4 10*3/uL (ref 4.0–10.5)
nRBC: 0.2 % (ref 0.0–0.2)

## 2023-08-17 LAB — COMPREHENSIVE METABOLIC PANEL
ALT: 18 U/L (ref 0–44)
AST: 56 U/L — ABNORMAL HIGH (ref 15–41)
Albumin: 2.4 g/dL — ABNORMAL LOW (ref 3.5–5.0)
Alkaline Phosphatase: 140 U/L — ABNORMAL HIGH (ref 38–126)
Anion gap: 17 — ABNORMAL HIGH (ref 5–15)
BUN: 52 mg/dL — ABNORMAL HIGH (ref 8–23)
CO2: 19 mmol/L — ABNORMAL LOW (ref 22–32)
Calcium: 7 mg/dL — ABNORMAL LOW (ref 8.9–10.3)
Chloride: 101 mmol/L (ref 98–111)
Creatinine, Ser: 4.14 mg/dL — ABNORMAL HIGH (ref 0.44–1.00)
GFR, Estimated: 11 mL/min — ABNORMAL LOW (ref >60)
Glucose, Bld: 61 mg/dL — ABNORMAL LOW (ref 70–99)
Potassium: 4.1 mmol/L (ref 3.5–5.1)
Sodium: 137 mmol/L (ref 135–145)
Total Bilirubin: 1.5 mg/dL — ABNORMAL HIGH (ref 0.0–1.2)
Total Protein: 6.7 g/dL (ref 6.5–8.1)

## 2023-08-17 LAB — TROPONIN I (HIGH SENSITIVITY): Troponin I (High Sensitivity): 113 ng/L (ref <18)

## 2023-08-17 MED ORDER — FENTANYL CITRATE PF 50 MCG/ML IJ SOSY
50.0000 ug | PREFILLED_SYRINGE | INTRAMUSCULAR | Status: AC | PRN
  Administered 2023-08-17 – 2023-08-18 (×3): 50 ug via INTRAVENOUS
  Filled 2023-08-17 (×3): qty 1

## 2023-08-17 MED ORDER — PANTOPRAZOLE SODIUM 40 MG IV SOLR
80.0000 mg | Freq: Once | INTRAVENOUS | Status: AC
  Administered 2023-08-17: 80 mg via INTRAVENOUS
  Filled 2023-08-17: qty 20

## 2023-08-17 NOTE — ED Triage Notes (Signed)
"  I went to the bathroom this morning and had blood clots coming out of my rectum". "Recent ED visit".

## 2023-08-17 NOTE — ED Provider Notes (Signed)
 [place] EMERGENCY DEPARTMENT AT [facility] Provider Note   CSN: [id number] Arrival date & time: 08/17/23  1955     History Chief Complaint  Patient presents with   Nausea   Back Pain   Dizziness    HPI Kim Anthony is a 73 y.o. female presenting for a myriad of complaints.  73 year old female presenting primarily with right leg radiating up her hip pain.  States she was diagnosed with shingles and is having uncontrollable pain secondary to this despite taking multiple narcotics in the outpatient setting.  However she was noted to be transferred from urgent care where she had checked in for bright red blood per rectum and sent over for hypotension.  Patient did not report any of this to me I had to go back in after I read the transfer documentation.  Then she recalls that this morning she had 5 episodes of dark blood per rectum. She does feel slightly lightheaded.  States she has a history of similar States that for her diagnosed shingles she has been taking Advil 800 mg 3 times a day in addition to all the above pain medications. Developed nausea and dizziness throughout the day today. Patient's recorded medical, surgical, social, medication list and allergies were reviewed in the Snapshot window as part of the initial history.   Review of Systems   Review of Systems  Constitutional:  Negative for chills and fever.  HENT:  Negative for ear pain and sore throat.   Eyes:  Negative for pain and visual disturbance.  Respiratory:  Negative for cough and shortness of breath.   Cardiovascular:  Negative for chest pain and palpitations.  Gastrointestinal:  Positive for abdominal pain. Negative for vomiting.  Genitourinary:  Negative for dysuria and hematuria.  Musculoskeletal:  Negative for arthralgias and back pain.  Skin:  Negative for color change and rash.  Neurological:  Negative for seizures and syncope.  All other systems reviewed and are negative.   Physical  Exam Updated Vital Signs BP 106/61   Pulse 78   Temp 98.2 F (36.8 C) (Oral)   Resp (!) 23   Ht 5\' 5"  (1.651 m)   Wt 74.4 kg   SpO2 95%   BMI 27.29 kg/m  Physical Exam Vitals and nursing note reviewed.  Constitutional:      General: She is not in acute distress.    Appearance: She is well-developed.  HENT:     Head: Normocephalic and atraumatic.  Eyes:     Conjunctiva/sclera: Conjunctivae normal.  Cardiovascular:     Rate and Rhythm: Normal rate and regular rhythm.     Heart sounds: No murmur heard. Pulmonary:     Effort: Pulmonary effort is normal. No respiratory distress.     Breath sounds: Normal breath sounds.  Abdominal:     General: There is no distension.     Palpations: Abdomen is soft.     Tenderness: There is no abdominal tenderness. There is no right CVA tenderness or left CVA tenderness.  Musculoskeletal:        General: No swelling or tenderness. Normal range of motion.     Cervical back: Neck supple.  Skin:    General: Skin is warm and dry.  Neurological:     General: No focal deficit present.     Mental Status: She is alert and oriented to person, place, and time. Mental status is at baseline.     Cranial Nerves: No cranial nerve deficit.  ED Course/ Medical Decision Making/ A&P Clinical Course as of 08/17/23 2302  Tue Aug 17, 2023  2154 Hello Dr. Marca Ancona: I have a stable melena. She follows with [facility] (seen by Schooler) HGB 11-9 in 2 weeks. Dx with shingles and has been taking 600 ibuprofen TID x1 week. Admitting to medicine with protonix for AM consultation. Thanks! CRC   [CC]    Clinical Course User Index [CC] Glyn Ade, MD    Procedures Procedures   Medications Ordered in ED Medications  fentaNYL (SUBLIMAZE) injection 50 mcg (50 mcg Intravenous Given 08/17/23 2042)  pantoprazole (PROTONIX) injection 80 mg (has no administration in time range)    Medical Decision Making:   73 year old female presenting with a chief complaint  of melena. Hemoglobin drop from 11-9 on exam today. AKI detected, has a elevated troponin though EKG shows no acute pathology does not have any chest pain at this time.  Serial troponin pending. Blood pressure responsive to IV fluids and improving. Asynchronous consultation with gastroenterology.  Message sent as per ED course. Will arrange for admission to medicine for further definitive care and management stabilization overnight. Disposition:   Based on the above findings, I believe this patient is stable for admission.    Patient/family educated about specific findings on our evaluation and explained exact reasons for admission.  Patient/family educated about clinical situation and time was allowed to answer questions.   Admission team communicated with and agreed with need for admission. Patient admitted. Patient ready to move at this time.     Emergency Department Medication Summary:   Medications  fentaNYL (SUBLIMAZE) injection 50 mcg (50 mcg Intravenous Given 08/17/23 2042)  pantoprazole (PROTONIX) injection 80 mg (has no administration in time range)        Clinical Impression: No diagnosis found.   Data Unavailable   Final Clinical Impression(s) / ED Diagnoses Final diagnoses:  None    Rx / DC Orders ED Discharge Orders     None

## 2023-08-17 NOTE — ED Notes (Signed)
 Patient is being discharged from the Urgent Care and sent to the Emergency Department via EMS, Called at 1911. [name] Per Dr. Marlinda Mike MD, patient is in need of higher level of care due to Low BP. Patient is aware and verbalizes understanding of plan of care.  Vitals:   08/17/23 1902 08/17/23 1906  BP: (!) 67/44 (!) 71/56  Pulse: 82 77  Resp: (!) 24 (!) 24  Temp: 98.2 F (36.8 C)   SpO2: 95% 98%

## 2023-08-17 NOTE — Transitions of Care (Post Inpatient/ED Visit) (Signed)
 08/17/2023  Name: Kim Anthony [name]: [id number] DOB: Apr 13, 1951  Today's TOC FU Call Status: Today's TOC FU Call Status:: Successful TOC FU Call Completed TOC FU Call Complete Date: 08/17/23 Patient's Name and Date of Birth confirmed.  Transition Care Management Follow-up Telephone Call Date of Discharge: 08/14/23 Discharge Facility: [facility]) Type of Discharge: Emergency Department Reason for ED Visit: Other: (body aches) How have you been since you were released from the hospital?: Same Any questions or concerns?: No  Items Reviewed: Did you receive and understand the discharge instructions provided?: Yes Medications obtained,verified, and reconciled?: Yes (Medications Reviewed) Any new allergies since your discharge?: No Dietary orders reviewed?: Yes Do you have support at home?: No  Medications Reviewed Today: Medications Reviewed Today     Reviewed by Karena Addison, LPN (Licensed Practical Nurse) on 08/17/23 at 1446  Med List Status: <None>   Medication Order Taking? Sig Documenting Provider Last Dose Status Informant  albuterol (VENTOLIN HFA) 108 (90 Base) MCG/ACT inhaler [id number] No INHALE 2 PUFFS BY MOUTH EVERY 6 HOURS AS NEEDED FOR WHEEZING FOR SHORTNESS OF Britt Bolognese, MD Taking Active Self, Pharmacy Records  colchicine 0.6 MG tablet [id number] No Take 1 tablet (0.6 mg total) by mouth daily.  Patient not taking: Reported on 08/10/2023   Garey Ham, PA-C Not Taking Active Self, Pharmacy Records  FARXIGA 10 MG TABS tablet [id number] No Take 1 tablet by mouth once daily Kathleen Lime, MD Taking Active Self, Pharmacy Records  fluticasone [facility]) 50 MCG/ACT nasal spray [id number] No Place 2 sprays into both nostrils daily. Kathleen Lime, MD Taking Active Self, Pharmacy Records  gabapentin (NEURONTIN) 800 MG tablet [id number] No Take 1 tablet (800 mg total) by mouth 3 (three) times daily. Lovorn, Aundra Millet, MD Taking Active Self, Pharmacy  Records  HYDROcodone-acetaminophen (NORCO/VICODIN) 5-325 MG tablet [id number] No Take 1 tablet by mouth every 4 (four) hours as needed for moderate pain (pain score 4-6). Lovorn, Megan, MD Taking Active   losartan (COZAAR) 25 MG tablet [id number]  Take 0.5 tablets (12.5 mg total) by mouth daily. [place], [place], [place]  Active   metoprolol succinate (TOPROL-XL) 25 MG 24 hr tablet [id number] No Take 1 tablet (25 mg total) by mouth 2 (two) times daily. Monna Fam, MD Taking Active   potassium chloride (KLOR-CON M) 10 MEQ tablet [id number]  Take 6 tablets (60 mEq total) by mouth daily. [place], [place], [place]  Active   simvastatin (ZOCOR) 20 MG tablet [id number] No Take 1 tablet (20 mg total) by mouth daily. Willette Cluster, MD Taking Active Self, Pharmacy Records  spironolactone (ALDACTONE) 25 MG tablet [id number]  Take 0.5 tablets (12.5 mg total) by mouth daily. [place], [place], [place]  Active   spironolactone (ALDACTONE) 25 MG tablet [id number] No Take 25 mg by mouth daily. [provider] Taking Active   torsemide (DEMADEX) 100 MG tablet [id number]  Take 1 tablet (100 mg total) by mouth daily. Napoleon, Jennings, FNP  Active   TRELEGY ELLIPTA 100-62.5-25 MCG/ACT AEPB [id number] No INHALE 1 PUFF ONCE DAILY INTO  THE  LUNGS Kathleen Lime, MD Taking Active Self, Pharmacy Records  warfarin (COUMADIN) 5 MG tablet [id number] No Take one-and-one-half (1 & 1/2) of your 5 mg peach colored warfarin tablets on Mondays and Thursdays. All other days, take only one (1) tablet.  Patient taking differently: Take 5 mg by mouth daily.   Elicia Lamp, RPH-CPP Taking Active Self, Pharmacy Records  Med Note Neil Crouch, SEBASTIAN   Thu Jul 29, 2023  9:12 PM) NOTE: Patient reports she has not been taking her warfarin as prescribed. She has been taking one 5mg  tablet of warfarin once daily.             Home Care and Equipment/Supplies: Were Home Health Services Ordered?: NA Any new equipment or medical  supplies ordered?: NA  Functional Questionnaire: Do you need assistance with bathing/showering or dressing?: No Do you need assistance with meal preparation?: No Do you need assistance with eating?: No Do you have difficulty maintaining continence: No Do you need assistance with getting out of bed/getting out of a chair/moving?: No Do you have difficulty managing or taking your medications?: No  Follow up appointments reviewed: PCP Follow-up appointment confirmed?: Yes Date of PCP follow-up appointment?: 08/18/23 Follow-up Provider: [facility] Follow-up appointment confirmed?: NA Do you need transportation to your follow-up appointment?: No Do you understand care options if your condition(s) worsen?: Yes-patient verbalized understanding    SIGNATURE Karena Addison, LPN [facility] Nurse Health Advisor Direct Dial [phone]

## 2023-08-17 NOTE — ED Triage Notes (Signed)
 PT presents to ED via EMS from Urgent Care c/o x1 day general malaise and rectal bleeding. Reports x4 episodes of large clots in toilet following BM since yesterday. PT also c/o dizziness since onset. PT also c/o bilat knee pain, and sciatic pain. PMH COPD, CHF, open heart 2001, Breast CA.

## 2023-08-17 NOTE — H&P (Incomplete)
 Date: 08/18/2023         Patient Name:  Kim Anthony [name]: [id number]  DOB: [birth year] Age / Sex: 73 y.o., female   PCP: Kathleen Lime, MD         Medical Service: Internal Medicine Teaching Service         Attending Physician: Dr. Madaline Brilliant, DO     First Contact: Dr. Lovie Macadamia MD Pager [phone]  Pager: [phone]  Second Contact: Dr. Champ Mungo, DO Pager [phone] Pager: [phone]       After Hours (After 5p/  First Contact Pager: [phone]  weekends / holidays): Second Contact Pager: [phone]   Chief Concern: bloody stools, dizziness, nausea   History of Present Illness: Kim Anthony is a 73 y.o.female with significant past medical history of  HFrEF [LVEF 35-40% 05/11/2022], mechanical mitral valve placement [2001] on chronic warfarin therapy [INR goal 2.5-3.5], paroxymal A-fib, CAD, HTN, HLD, hx breast cancer s/p left mastectomy and radiation/chemo, who presented to ED due to bloody stools and dizziness.   Notably, patient was last hospitalized on 2/6, and discharged on 2/9 for A-fib with RVR, and AKI, and found to have acute on chronic biventricular heart failure.  After discharge, she saw advanced heart failure specialist, Dr. Gala Romney on 2/18. Was noted pt was overall feeling poorly. Having gout flare in wrist and knee. Face and legs were swollen  She had SOB walking short distances in the house.  her dose of torsemide was increased from 60 mg to 100mg .  At that visit, her creatinine increased to 2.1.She presented to urgent care today with bloody stools, she was found to be hypotensive, was directed to present to the ED.   She says she had 5 episodes of dark brown stool on 2/24, was painless and associated with abdominal pain.  Today while at the UC she passed large clots in the urgent care.  Here in the ED, she had a large bright red bloody bowel movement. No nausea, vomiting, diarrhea or hematemesis. Denies prior history of upper/lower GI bleed.  She has had right  nasal bleeds, from prior hospitalization, where she has to use oxygen. No history of GI cancer in the family, or PUD.  Last colonoscopy was in 2019, which showed 20 mm polyp that was removed and internal hemorrhoids. No  Prior EGDs.  For the past 2 weeks, she has been taking NSAIDs consistently for postherpetic neuralgia and gout flares.  She takes warfarin for valvular A-fib, follows with [name], daughter helps with managing home INRs, however, this has not been checked for the past few weeks.  Last dose was on on 2/24, patient is out of refills.    She has a history of COPD, denies worsening dyspnea or increasing sputum volume or purulence.  Denies chest pain, PND or orthopnea.  She endorses adequate diuresis with increased dose of torsemide 100 mg, endorses compliance.  Daughter says she has not peeing for the last two days. Denies dysuria.  Allergies: Allergies  Allergen Reactions   Lisinopril Swelling   Acetaminophen-Codeine Itching   Propoxyphene Itching    Darvocet   Tape Rash    Plastic   Tramadol Itching, Nausea And Vomiting and Rash   Past Medical History:  Past Medical History:  Diagnosis Date   Arthritis    Asthma    Breast cancer (HCC) 2001   Left Breast Cancer   CHF (congestive heart failure) (HCC)    COPD (chronic obstructive pulmonary disease) (HCC)  Coronary artery disease    GERD (gastroesophageal reflux disease)    History of mitral valve replacement with mechanical valve    HLD (hyperlipidemia)    Hypertension    Pulmonary embolism (HCC) 2021   Medications: Current Meds  Medication Sig   albuterol (VENTOLIN HFA) 108 (90 Base) MCG/ACT inhaler INHALE 2 PUFFS BY MOUTH EVERY 6 HOURS AS NEEDED FOR WHEEZING FOR SHORTNESS OF BREATH   FARXIGA 10 MG TABS tablet Take 1 tablet by mouth once daily   fluticasone (FLONASE) 50 MCG/ACT nasal spray Place 2 sprays into both nostrils daily. (Patient taking differently: Place 1 spray into both nostrils daily.)    gabapentin (NEURONTIN) 800 MG tablet Take 1 tablet (800 mg total) by mouth 3 (three) times daily.   HYDROcodone-acetaminophen (NORCO/VICODIN) 5-325 MG tablet Take 1 tablet by mouth every 4 (four) hours as needed for moderate pain (pain score 4-6).   losartan (COZAAR) 25 MG tablet Take 0.5 tablets (12.5 mg total) by mouth daily.   metoprolol succinate (TOPROL-XL) 25 MG 24 hr tablet Take 1 tablet (25 mg total) by mouth 2 (two) times daily.   potassium chloride (KLOR-CON M) 10 MEQ tablet Take 6 tablets (60 mEq total) by mouth daily. (Patient taking differently: Take 60 mEq by mouth 2 (two) times daily. Take 3 tablets in the morning and 3 tablets in the afternoon)   simvastatin (ZOCOR) 20 MG tablet Take 1 tablet (20 mg total) by mouth daily.   tetrahydrozoline 0.05 % ophthalmic solution Place 1 drop into both eyes 3 (three) times daily.   torsemide (DEMADEX) 100 MG tablet Take 1 tablet (100 mg total) by mouth daily.   TRELEGY ELLIPTA 100-62.5-25 MCG/ACT AEPB INHALE 1 PUFF ONCE DAILY INTO  THE  LUNGS   warfarin (COUMADIN) 5 MG tablet Take one-and-one-half (1 & 1/2) of your 5 mg peach colored warfarin tablets on Mondays and Thursdays. All other days, take only one (1) tablet. (Patient taking differently: Take 5 mg by mouth daily.)   * Takes warfarin 7.5 mg on M/Th, last dose 02/24  Surgical History: Past Surgical History:  Procedure Laterality Date   APPLICATION OF A-CELL OF CHEST/ABDOMEN Left 01/27/2019   Procedure: APPLICATION OF A-CELL OF CHEST;  Surgeon: Peggye Form, DO;  Location: [facility] OR;  Service: Plastics;  Laterality: Left;   APPLICATION OF WOUND VAC N/A 01/27/2019   Procedure: APPLICATION OF WOUND VAC;  Surgeon: Peggye Form, DO;  Location: [facility] OR;  Service: Plastics;  Laterality: N/A;   BREAST SURGERY Left 2001   for CA   CARDIOVERSION N/A 02/26/2022   Procedure: CARDIOVERSION;  Surgeon: Little Ishikawa, MD;  Location: [facility] ENDOSCOPY;  Service: Cardiovascular;   Laterality: N/A;   COLONOSCOPY WITH PROPOFOL N/A 06/07/2018   Procedure: COLONOSCOPY WITH PROPOFOL;  Surgeon: Charlott Rakes, MD;  Location: [facility] ENDOSCOPY;  Service: Endoscopy;  Laterality: N/A;   DEBRIDEMENT AND CLOSURE WOUND Left 12/28/2018   Procedure: Debridement And Closure Wound;  Surgeon: Abigail Miyamoto, MD;  Location[facility] OR;  Service: General;  Laterality: Left;   INCISION AND DRAINAGE OF WOUND Left 01/27/2019   Procedure: IRRIGATION AND DEBRIDEMENT OF CHEST WALL;  Surgeon: Peggye Form, DO;  Location: [facility] OR;  Service: Plastics;  Laterality: Left;   INTRAMEDULLARY (IM) NAIL INTERTROCHANTERIC N/A 09/23/2022   Procedure: INTRAMEDULLARY (IM) NAIL INTERTROCHANTERIC;  Surgeon: Tarry Kos, MD;  Location: [facility] OR;  Service: Orthopedics;  Laterality: N/A;   KNEE ARTHROSCOPY WITH MEDIAL MENISECTOMY Left 08/06/2021   Procedure: KNEE ARTHROSCOPY  WITH PARTIAL MEDIAL MENISECTOMY, DEBRIDEMENT;  Surgeon: Jene Every, MD;  Location: [facility] ORS;  Service: Orthopedics;  Laterality: Left;  1 HR   LATISSIMUS FLAP TO BREAST Left 01/18/2019   Procedure: LEFT LATISSIMUS FLAP TO BREAST;  Surgeon: Peggye Form, DO;  Location: [facility] OR;  Service: Plastics;  Laterality: Left;   MASTECTOMY Left    MITRAL VALVE REPLACEMENT     mechanical   POLYPECTOMY  06/07/2018   Procedure: POLYPECTOMY;  Surgeon: Charlott Rakes, MD;  Location: [facility] ENDOSCOPY;  Service: Endoscopy;;   RIGHT/LEFT HEART CATH AND CORONARY ANGIOGRAPHY N/A 01/09/2021   Procedure: RIGHT/LEFT HEART CATH AND CORONARY ANGIOGRAPHY;  Surgeon: Dolores Patty, MD;  Location: [facility] INVASIVE CV LAB;  Service: Cardiovascular;  Laterality: N/A;   WOUND DEBRIDEMENT Left 12/28/2018   Procedure: EXCISION OF LEFT CHRONIC CHEST WALL WOUND;  Surgeon: Abigail Miyamoto, MD;  Location: [facility] OR;  Service: General;  Laterality: Left;    Family History:  Family History  Problem Relation Age of Onset   Hypertension Mother    Cancer Father     Hypertension Father    Breast cancer Maternal Aunt 50   Heart attack Other    Social History:  Lives with her daughter in [place]  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 Level of Function:Independent of her ADLs/iADLS. Sometimes uses cane for mobility.  PCP: Kathleen Lime, MD Substances: Smoke 1 cigarette/day, drinks 1-2 beers daily, no signs of withdrawal.  Denies cocaine or other recreational drug use.  Physical Exam: Blood pressure 106/61, pulse 78, temperature 98.2 F (36.8 C), temperature source Oral, resp. rate (!) 23, height 5\' 5"  (1.651 m), weight 74.4 kg, SpO2 95%.  Constitutional: In mild distress.  HENT: normocephalic atraumatic, mucous membranes moist Cardiovascular: regular rate and rhythm, no m/r/g, no JVD.  Pulmonary/Chest: normal work of breathing on room air, lungs clear to auscultation bilaterally.  No crackles  Abdominal: soft, lower abdominal tenderness, positive bowel sounds. + DRE. Neurological: alert & oriented x 3 MSK: no gross abnormalities.  No pitting edema. Skin: Dry, non palpable pedal pulse, with cool peripheral extremities.  Pulse appreciated with bedside doppler. Psych: Normal mood and affect.  EKG:  Normal sinus rhythm. Np ST elevation or  T wave inversion.  Prolonged Qtc 565  Labs: CBC    Latest Ref Rng & Units 08/18/2023    2:34 AM 08/18/2023    1:37 AM 08/17/2023    8:35 PM  CBC  WBC 4.0 - 10.5 K/uL 9.8  9.9  9.4   Hemoglobin 12.0 - 15.0 g/dL 8.4  8.7  9.4   Hematocrit 36.0 - 46.0 % 27.6  29.3  31.2   Platelets 150 - 400 K/uL 409  434  419        Latest Ref Rng & Units 08/18/2023    2:34 AM 08/18/2023    1:37 AM 08/17/2023    8:35 PM  CMP  Glucose 70 - 99 mg/dL 64  66  61   BUN 8 - 23 mg/dL 58  58  52   Creatinine 0.44 - 1.00 mg/dL 4.09  8.11  9.14   Sodium 135 - 145 mmol/L 139  139  137   Potassium 3.5 - 5.1 mmol/L 4.2  4.6  4.1   Chloride 98 - 111 mmol/L 105  106  101   CO2 22 - 32 mmol/L 17  19  19    Calcium 8.9 - 10.3 mg/dL 6.8  6.6  7.0    Total Protein 6.5 - 8.1 g/dL  6.0  6.7   Total Bilirubin 0.0 - 1.2 mg/dL  1.3  1.5   Alkaline Phos 38 - 126 U/L  132  140   AST 15 - 41 U/L  55  56   ALT 0 - 44 U/L  17  18    *last Scr 2/18 2.10  BNP (last 3 results) Recent Labs    01/19/23 1601 07/29/23 1347 08/17/23 2035  BNP 264.0* 374.8* 547.9*    Images and other studies: Abdominal x-ray  - Normal.  Assessment & Plan: Kim Anthony is a 73 y.o.female with significant past medical history of  biventricular HF (LVEF 50% 07/30/23) mechanical mitral valve placement [2001] on chronic warfarin therapy [INR goal 2.5-3.5], paroxymal AFL/AF, CAD,  HTN, HLD, hx breast cancer s/p left mastectomy and radiation/chemo, who presented to ED due to melena and dizziness and admitted for GI bleed and AKI  Principal Problem:   GI bleed Active Problems:   Biventricular heart failure with reduced left ventricular function (HCC)   S/P mitral valve replacement   Hypotension   Warfarin anticoagulation   Acute renal failure (HCC)   Anemia   QT prolongation   # GI bleed # Melena  # Symptomatic anemia  # Chronic Coumadin use Presenting with dark brown stools, hypotensive,  MAPS >65, Hb 9.8 (was Hb 11 2 weeks ago). Differential diagnoses that were considered includes PUD vs. NSAIDS/aspirin induced vs esophageal varices.  I have high suspicion for NSAID induced upper GI bleed, due to increased NSAID use. No history of ulcer, not endorsing worsening abdominal pain after eating, makes PUD less likely.  On my exam, DRE positive for blood. I query if there is also a component of her lower GI bleed, she does have a history of hemorrhoids.  No evidence of diverticulosis on last colonoscopy. Given her hx of constipation, I wonder if she had developed diverticulosis.  She follows with [facility], no EGD in the past, colonoscopy was in 2019 with removal or 20 cm polyp, and evidence of internal hemorrhoids.  ED physician consulted GI, recommended starting  Protonix.  Will benefit from EGD and colonoscopy to rule out upper and lower GI bleed respectively.  Update: On our next evaluation, she had a MAP < 65, unable to get access, PICC line team also not available after 11 PM.  PCCM has been consulted, to help with PICC line placement.  - Follow-up GI recs - N.p.o. - Continue IV Protonix - Monitor CBC, INR - May need warfarin referral, pending INR. - Anemia workup with ferritin, TIBC.  # Low/normal blood pressure. # Anion gap metabolic acidosis, with elevated lactic acid. Softer blood pressure, MAP> 65,  lactic acid> 2.0 concerning for impending shock.  Softer blood pressure multifactorial in the setting of overdiuresis, and acute GI bleed.  Alternatively also considered low perfusion due to biventricular heart failure, but that is less likely as patient is dry on exams, with no evidence of vascular congestion.  - Will give gentle IV fluids - Hold torsemide. - Trend lactic acid.  Update: MAP of 49, and 64 at 1:50 AM, and 2: 10 AM respectively.  She needed IV fluids, blood transfusion, and high concern for supratherapeutic INR given her hx, there was also the concern she would need FFP and vit K for reversal. Thus,  we consulted PCCM to help with PICC line placement due to concern for impending shock, as stated above.  # AKI on CKD 3a Elevated SCr 4.14,  baseline Scr  1-1.2.  I think patient's AKI is multifactorial; prerenal in the setting of dehydration from increased dose of torsemide, and intrarenal from softer blood pressures and the use of NSAIDs. No evidence of urinary retention on bladder scan.   I also considered cardiorenal syndrome, giving history of heart failure, but that is less likely as there is no evidence of congestion exam.    - BNP. - Monitor BMP. - Hold nephrotoxic medicines - Hold torsemide.  #Troponin elevation Troponin mildly elevated to 113.  Not reporting chest pain.  Likely in the setting of demand ischemia.   Normal EKG.  Will trend troponin to peak.  -Monitor troponin.   # Chronic biventricular heart failure. # Hypertension Hypovolemic on exams, 2/2 to dose increase of  torsemide.  TTE 07/30/23 LVEF 50% and biventricular HF. Patient home regimen includes losartan, Farxiga, spironolactone, and torsemide, and metoprolol.  Will hold antihypertensive and torsemide.  - Limited TTE. - Hold losartan 12.5 mg - Hold Farxiga - Hold spirolactone 25mg  - Hold torsemide as above. - Hold metoprolol 25 mg.  # A-fib, Valvular # Prolonged QT # Mechanical mitral valve replacement. EKG with normal sinus rhythm.  Home regimen includes warfarin, and metoprolol 25 mg.   - INR as above - Hold metoprolol as above.  # Chronic therapeutic opiate use. Has not had opioids for the last 24 hours, given  chronic use, she is at high risk for withdrawal. - Continue Norco/Vicodin 5-3 25mg .(Takes 6 tablets/day)  Chronic medical conditions: # COPD - Continue Breo Ellipta and Incruse.  # Postherpetic neuralgia -Continue gabapentin 300 mg 3 times daily.  Diet: NPO VTE:  SCD IVF: 500 cc bolus LR  Code: Full  Signed: Laretta Bolster, MD 08/18/2023, 4:11 AM

## 2023-08-17 NOTE — ED Provider Notes (Signed)
 [facility]    CSN: [id number] Arrival date & time: 08/17/23  1841      History   Chief Complaint Chief Complaint  Patient presents with   Dizziness   Weakness   Pain   Blood In Stools   Eye Problem    HPI Kim Anthony is a 73 y.o. female.    Dizziness Associated symptoms: weakness   Weakness Associated symptoms: dizziness   Eye Problem Associated symptoms: weakness   Here for dizziness and rectal bleeding and weakness.    Past Medical History:  Diagnosis Date   Arthritis    Asthma    Breast cancer (HCC) 2001   Left Breast Cancer   CHF (congestive heart failure) (HCC)    COPD (chronic obstructive pulmonary disease) (HCC)    Coronary artery disease    GERD (gastroesophageal reflux disease)    History of mitral valve replacement with mechanical valve    HLD (hyperlipidemia)    Hypertension    Pulmonary embolism (HCC) 2021    Patient Active Problem List   Diagnosis Date Noted   History of mitral valve prosthesis 07/31/2023   Atrial flutter (HCC) 07/31/2023   Right ventricular failure (HCC) 07/31/2023   Acute hypoxic respiratory failure (HCC) 07/30/2023   Acute on chronic combined systolic and diastolic heart failure (HCC) 07/30/2023   CKD (chronic kidney disease) stage 3, GFR 30-59 ml/min (HCC) 07/30/2023   Gout 07/30/2023   Post herpetic neuralgia 07/30/2023   Atrial fibrillation with RVR (HCC) 07/29/2023   Arthritis of right wrist due to gout 07/23/2023   Wrist arthritis 07/23/2023   Asymmetrical sensorineural hearing loss 01/27/2023   Bilateral hearing loss 12/01/2022   Hypokalemia 11/18/2022   Thrombocytosis 10/16/2022   Hyperkalemia 10/08/2022   AKI (acute kidney injury) (HCC) 10/07/2022   Alteration in anticoagulation 09/25/2022   History of mitral valve replacement with mechanical valve 09/24/2022   Anticoagulated 09/24/2022   Closed fracture of right hip (HCC) 09/23/2022   Closed intertrochanteric fracture of right femur,  initial encounter (HCC) 09/22/2022   H/O mitral valve replacement with mechanical valve 09/22/2022   Chronic HFrEF (heart failure with reduced ejection fraction) (HCC) 09/22/2022   Endometrial thickening on ultrasound 07/07/2022   Gastroesophageal reflux disease without esophagitis 06/29/2022   Chronic pain of both knees 06/29/2022   Vaginal bleeding 04/01/2022   A-fib (HCC) 03/13/2022   Facial edema 03/07/2022   Atypical atrial flutter (HCC)    Combined systolic and diastolic heart failure (HCC) 02/19/2022   CHF (congestive heart failure) (HCC) 02/19/2022   Combined systolic and diastolic congestive heart failure (HCC) 02/18/2022   Acute pulmonary edema (HCC)    Edema of both lower extremities 01/13/2022   Hypotension 01/13/2022   Subacute cough 11/28/2021   COPD exacerbation (HCC) 11/05/2021   Conjunctival hemorrhage of left eye 06/25/2021   Cellulitis of right knee 05/12/2021   Osteoarthritis of knees, bilateral 05/12/2021   Osteoarthritis of left knee 04/24/2021   Bunion 03/18/2021   Inflammatory polyarthropathy (HCC) 03/04/2021   Seronegative inflammatory arthritis 02/17/2021   High risk medication use 02/17/2021   Elevated transaminase level 01/27/2021   Pain in right hand 01/16/2021   Acute idiopathic gout of multiple sites 09/30/2020   Pain in joint of left knee 09/27/2020   Right wrist pain 05/29/2020   Ganglion of right wrist 05/29/2020   Chronic pain syndrome 01/17/2020   Monoarthritis of wrist 10/18/2019   Ganglion cyst 09/21/2019   Hip pain 08/23/2019   Thoracic ascending aortic  aneurysm (HCC) 07/05/2019   Macrocytic anemia 02/16/2019   Breast cancer (HCC)    Acquired absence of left breast 12/20/2018   COPD (chronic obstructive pulmonary disease) (HCC) 11/25/2017   S/P MVR (mitral valve replacement)    Chronic anticoagulation    Tobacco use disorder 02/25/2017   Lymphadenopathy, mediastinal 02/25/2017   Essential hypertension 02/01/2016   Allergic rhinitis  02/01/2016   Healthcare maintenance 01/30/2016   Chronic low back pain 07/22/2015   Pulmonary embolism (HCC) 07/11/2015   Biventricular heart failure with reduced left ventricular function (HCC) 07/11/2015   Headache, common migraine 07/11/2015   HLD (hyperlipidemia) 07/11/2015    Past Surgical History:  Procedure Laterality Date   APPLICATION OF A-CELL OF CHEST/ABDOMEN Left 01/27/2019   Procedure: APPLICATION OF A-CELL OF CHEST;  Surgeon: Peggye Form, DO;  Location: [facility] OR;  Service: Plastics;  Laterality: Left;   APPLICATION OF WOUND VAC N/A 01/27/2019   Procedure: APPLICATION OF WOUND VAC;  Surgeon: Peggye Form, DO;  Location: [facility] OR;  Service: Plastics;  Laterality: N/A;   BREAST SURGERY Left 2001   for CA   CARDIOVERSION N/A 02/26/2022   Procedure: CARDIOVERSION;  Surgeon: Little Ishikawa, MD;  Location: [facility] ENDOSCOPY;  Service: Cardiovascular;  Laterality: N/A;   COLONOSCOPY WITH PROPOFOL N/A 06/07/2018   Procedure: COLONOSCOPY WITH PROPOFOL;  Surgeon: Charlott Rakes, MD;  Location: [facility] ENDOSCOPY;  Service: Endoscopy;  Laterality: N/A;   DEBRIDEMENT AND CLOSURE WOUND Left 12/28/2018   Procedure: Debridement And Closure Wound;  Surgeon: Abigail Miyamoto, MD;  Location: [facility] OR;  Service: General;  Laterality: Left;   INCISION AND DRAINAGE OF WOUND Left 01/27/2019   Procedure: IRRIGATION AND DEBRIDEMENT OF CHEST WALL;  Surgeon: Peggye Form, DO;  Location: [facility] OR;  Service: Plastics;  Laterality: Left;   INTRAMEDULLARY (IM) NAIL INTERTROCHANTERIC N/A 09/23/2022   Procedure: INTRAMEDULLARY (IM) NAIL INTERTROCHANTERIC;  Surgeon: Tarry Kos, MD;  Location: [facility] OR;  Service: Orthopedics;  Laterality: N/A;   KNEE ARTHROSCOPY WITH MEDIAL MENISECTOMY Left 08/06/2021   Procedure: KNEE ARTHROSCOPY WITH PARTIAL MEDIAL MENISECTOMY, DEBRIDEMENT;  Surgeon: Jene Every, MD;  Location: [facility] ORS;  Service: Orthopedics;  Laterality: Left;  1 HR   LATISSIMUS FLAP TO  BREAST Left 01/18/2019   Procedure: LEFT LATISSIMUS FLAP TO BREAST;  Surgeon: Peggye Form, DO;  Location: [facility] OR;  Service: Plastics;  Laterality: Left;   MASTECTOMY Left    MITRAL VALVE REPLACEMENT     mechanical   POLYPECTOMY  06/07/2018   Procedure: POLYPECTOMY;  Surgeon: Charlott Rakes, MD;  Location: [facility] ENDOSCOPY;  Service: Endoscopy;;   RIGHT/LEFT HEART CATH AND CORONARY ANGIOGRAPHY N/A 01/09/2021   Procedure: RIGHT/LEFT HEART CATH AND CORONARY ANGIOGRAPHY;  Surgeon: Dolores Patty, MD;  Location: [facility] INVASIVE CV LAB;  Service: Cardiovascular;  Laterality: N/A;   WOUND DEBRIDEMENT Left 12/28/2018   Procedure: EXCISION OF LEFT CHRONIC CHEST WALL WOUND;  Surgeon: Abigail Miyamoto, MD;  Location: [facility] OR;  Service: General;  Laterality: Left;    OB History     Gravida  2   Para  2   Term  2   Preterm      AB      Living         SAB      IAB      Ectopic      Multiple      Live Births  2            Home Medications  Prior to Admission medications   Medication Sig Start Date End Date Taking? Authorizing Provider  albuterol (VENTOLIN HFA) 108 (90 Base) MCG/ACT inhaler INHALE 2 PUFFS BY MOUTH EVERY 6 HOURS AS NEEDED FOR WHEEZING FOR SHORTNESS OF BREATH 07/15/23   Kathleen Lime, MD  colchicine 0.6 MG tablet Take 1 tablet (0.6 mg total) by mouth daily. Patient not taking: Reported on 08/10/2023 07/08/23   Rodriguez-Southworth, Nettie Elm, PA-C  FARXIGA 10 MG TABS tablet Take 1 tablet by mouth once daily 06/03/23   Kathleen Lime, MD  fluticasone [facility]) 50 MCG/ACT nasal spray Place 2 sprays into both nostrils daily. 05/17/23 05/16/24  Kathleen Lime, MD  gabapentin (NEURONTIN) 800 MG tablet Take 1 tablet (800 mg total) by mouth 3 (three) times daily. 03/01/23   Lovorn, Aundra Millet, MD  HYDROcodone-acetaminophen (NORCO/VICODIN) 5-325 MG tablet Take 1 tablet by mouth every 4 (four) hours as needed for moderate pain (pain score 4-6). 08/05/23   Lovorn, Aundra Millet, MD   losartan (COZAAR) 25 MG tablet Take 0.5 tablets (12.5 mg total) by mouth daily. 08/10/23 11/08/23  Jacklynn Ganong, FNP  metoprolol succinate (TOPROL-XL) 25 MG 24 hr tablet Take 1 tablet (25 mg total) by mouth 2 (two) times daily. 08/01/23   Monna Fam, MD  potassium chloride (KLOR-CON M) 10 MEQ tablet Take 6 tablets (60 mEq total) by mouth daily. 08/10/23 03/07/24  Jacklynn Ganong, FNP  potassium chloride (KLOR-CON) 10 MEQ tablet Take 10 mEq by mouth daily. 08/10/23   [provider]  simvastatin (ZOCOR) 20 MG tablet Take 1 tablet (20 mg total) by mouth daily. 12/03/22   Willette Cluster, MD  spironolactone (ALDACTONE) 25 MG tablet Take 0.5 tablets (12.5 mg total) by mouth daily. 08/10/23   Jacklynn Ganong, FNP  spironolactone (ALDACTONE) 25 MG tablet Take 25 mg by mouth daily.    [provider]  torsemide (DEMADEX) 100 MG tablet Take 1 tablet (100 mg total) by mouth daily. 08/10/23   Jacklynn Ganong, FNP  TRELEGY ELLIPTA 100-62.5-25 MCG/ACT AEPB INHALE 1 PUFF ONCE DAILY INTO  THE  LUNGS 07/15/23   Kathleen Lime, MD  warfarin (COUMADIN) 5 MG tablet Take one-and-one-half (1 & 1/2) of your 5 mg peach colored warfarin tablets on Mondays and Thursdays. All other days, take only one (1) tablet. Patient taking differently: Take 5 mg by mouth daily. 04/26/23   Elicia Lamp, RPH-CPP    Family History Family History  Problem Relation Age of Onset   Hypertension Mother    Cancer Father    Hypertension Father    Breast cancer Maternal Aunt 50   Heart attack Other     Social History Social History   Tobacco Use   Smoking status: Every Day    Current packs/day: 0.10    Average packs/day: 0.1 packs/day for 30.0 years (3.0 ttl pk-yrs)    Types: Cigarettes   Smokeless tobacco: Never   Tobacco comments:    5 cigs per day  Vaping Use   Vaping status: Never Used  Substance Use Topics   Alcohol use: Yes    Alcohol/week: 3.0 standard drinks of alcohol    Types: 3 Standard  drinks or equivalent per week    Comment: beer- ocasional   Drug use: No     Allergies   Lisinopril, Acetaminophen-codeine, Propoxyphene, Tape, and Tramadol   Review of Systems Review of Systems  Neurological:  Positive for dizziness and weakness.     Physical Exam Triage Vital Signs ED Triage Vitals  Encounter  Vitals Group     BP 08/17/23 1902 (!) 67/44     Systolic BP Percentile --      Diastolic BP Percentile --      Pulse Rate 08/17/23 1902 82     Resp 08/17/23 1902 (!) 24     Temp 08/17/23 1902 98.2 F (36.8 C)     Temp Source 08/17/23 1902 Oral     SpO2 08/17/23 1902 95 %     Weight 08/17/23 1909 174 lb 9.7 oz (79.2 kg)     Height 08/17/23 1909 5\' 5"  (1.651 m)     Head Circumference --      Peak Flow --      Pain Score 08/17/23 1909 7     Pain Loc --      Pain Education --      Exclude from Growth Chart --    No data found.  Updated Vital Signs BP (!) 71/56 (BP Location: Right Arm)   Pulse 77   Temp 98.2 F (36.8 C) (Oral)   Resp (!) 24   Ht 5\' 5"  (1.651 m)   Wt 79.2 kg   SpO2 98%   BMI 29.06 kg/m   Visual Acuity Right Eye Distance:   Left Eye Distance:   Bilateral Distance:    Right Eye Near:   Left Eye Near:    Bilateral Near:     Physical Exam Vitals reviewed. Nursing note reviewed: Blood pressure 71/56.. Constitutional:      General: She is not in acute distress.    Appearance: She is not toxic-appearing.  HENT:     Mouth/Throat:     Mouth: Mucous membranes are moist.  Cardiovascular:     Rate and Rhythm: Normal rate and regular rhythm.  Pulmonary:     Effort: Pulmonary effort is normal.     Breath sounds: Normal breath sounds.  Abdominal:     Palpations: Abdomen is soft.  Skin:    Coloration: Skin is not jaundiced or pale.  Neurological:     Mental Status: She is alert and oriented to person, place, and time.  Psychiatric:        Behavior: Behavior normal.      UC Treatments / Results  Labs (all labs ordered are  listed, but only abnormal results are displayed) Labs Reviewed - No data to display  EKG   Radiology No results found.  Procedures Procedures (including critical care time)  Medications Ordered in UC Medications - No data to display  Initial Impression / Assessment and Plan / UC Course  I have reviewed the triage vital signs and the nursing notes.  Pertinent labs & imaging results that were available during my care of the patient were reviewed by me and considered in my medical decision making (see chart for details).     With her hypotension and rectal bleeding she has to go to the emergency room.  With her low blood pressure and weakness it would be very difficult for her family to safely transfer her to the emergency room.  EMS was called for them to transfer her to the ER for further evaluation and treatment. Final Clinical Impressions(s) / UC Diagnoses   Final diagnoses:  Hypotension, unspecified hypotension type  Rectal bleeding     Discharge Instructions      To the ER by ambulance     ED Prescriptions   None    PDMP not reviewed this encounter.   Zenia Resides, MD 08/17/23 [phone]

## 2023-08-17 NOTE — ED Triage Notes (Signed)
"  Left eye is blurry, that is new". No chest pain. ? SOB.

## 2023-08-17 NOTE — Discharge Instructions (Signed)
 To the ER by ambulance

## 2023-08-18 ENCOUNTER — Other Ambulatory Visit: Payer: Self-pay

## 2023-08-18 ENCOUNTER — Encounter: Payer: 59 | Admitting: Student

## 2023-08-18 ENCOUNTER — Observation Stay (HOSPITAL_COMMUNITY): Payer: 59

## 2023-08-18 ENCOUNTER — Inpatient Hospital Stay (HOSPITAL_COMMUNITY): Payer: 59

## 2023-08-18 ENCOUNTER — Encounter (HOSPITAL_COMMUNITY): Payer: Self-pay | Admitting: Internal Medicine

## 2023-08-18 DIAGNOSIS — I071 Rheumatic tricuspid insufficiency: Secondary | ICD-10-CM | POA: Diagnosis present

## 2023-08-18 DIAGNOSIS — I4891 Unspecified atrial fibrillation: Secondary | ICD-10-CM | POA: Diagnosis not present

## 2023-08-18 DIAGNOSIS — Z01810 Encounter for preprocedural cardiovascular examination: Secondary | ICD-10-CM | POA: Diagnosis not present

## 2023-08-18 DIAGNOSIS — I110 Hypertensive heart disease with heart failure: Secondary | ICD-10-CM | POA: Diagnosis not present

## 2023-08-18 DIAGNOSIS — N179 Acute kidney failure, unspecified: Secondary | ICD-10-CM

## 2023-08-18 DIAGNOSIS — D72829 Elevated white blood cell count, unspecified: Secondary | ICD-10-CM | POA: Diagnosis not present

## 2023-08-18 DIAGNOSIS — R9431 Abnormal electrocardiogram [ECG] [EKG]: Secondary | ICD-10-CM

## 2023-08-18 DIAGNOSIS — I50814 Right heart failure due to left heart failure: Secondary | ICD-10-CM

## 2023-08-18 DIAGNOSIS — I5021 Acute systolic (congestive) heart failure: Secondary | ICD-10-CM | POA: Diagnosis not present

## 2023-08-18 DIAGNOSIS — I130 Hypertensive heart and chronic kidney disease with heart failure and stage 1 through stage 4 chronic kidney disease, or unspecified chronic kidney disease: Secondary | ICD-10-CM | POA: Diagnosis not present

## 2023-08-18 DIAGNOSIS — I10 Essential (primary) hypertension: Secondary | ICD-10-CM | POA: Diagnosis not present

## 2023-08-18 DIAGNOSIS — E785 Hyperlipidemia, unspecified: Secondary | ICD-10-CM

## 2023-08-18 DIAGNOSIS — J9600 Acute respiratory failure, unspecified whether with hypoxia or hypercapnia: Secondary | ICD-10-CM | POA: Diagnosis not present

## 2023-08-18 DIAGNOSIS — E876 Hypokalemia: Secondary | ICD-10-CM | POA: Diagnosis not present

## 2023-08-18 DIAGNOSIS — J811 Chronic pulmonary edema: Secondary | ICD-10-CM | POA: Diagnosis not present

## 2023-08-18 DIAGNOSIS — D62 Acute posthemorrhagic anemia: Secondary | ICD-10-CM

## 2023-08-18 DIAGNOSIS — K2901 Acute gastritis with bleeding: Secondary | ICD-10-CM | POA: Diagnosis present

## 2023-08-18 DIAGNOSIS — E872 Acidosis, unspecified: Secondary | ICD-10-CM | POA: Diagnosis present

## 2023-08-18 DIAGNOSIS — E871 Hypo-osmolality and hyponatremia: Secondary | ICD-10-CM | POA: Diagnosis not present

## 2023-08-18 DIAGNOSIS — I517 Cardiomegaly: Secondary | ICD-10-CM | POA: Diagnosis not present

## 2023-08-18 DIAGNOSIS — I5022 Chronic systolic (congestive) heart failure: Secondary | ICD-10-CM | POA: Diagnosis not present

## 2023-08-18 DIAGNOSIS — K3189 Other diseases of stomach and duodenum: Secondary | ICD-10-CM | POA: Diagnosis not present

## 2023-08-18 DIAGNOSIS — I2722 Pulmonary hypertension due to left heart disease: Secondary | ICD-10-CM | POA: Diagnosis present

## 2023-08-18 DIAGNOSIS — D649 Anemia, unspecified: Secondary | ICD-10-CM | POA: Diagnosis not present

## 2023-08-18 DIAGNOSIS — I82C11 Acute embolism and thrombosis of right internal jugular vein: Secondary | ICD-10-CM | POA: Diagnosis present

## 2023-08-18 DIAGNOSIS — R570 Cardiogenic shock: Secondary | ICD-10-CM | POA: Diagnosis present

## 2023-08-18 DIAGNOSIS — M7989 Other specified soft tissue disorders: Secondary | ICD-10-CM | POA: Diagnosis not present

## 2023-08-18 DIAGNOSIS — I509 Heart failure, unspecified: Secondary | ICD-10-CM | POA: Diagnosis not present

## 2023-08-18 DIAGNOSIS — K838 Other specified diseases of biliary tract: Secondary | ICD-10-CM | POA: Diagnosis not present

## 2023-08-18 DIAGNOSIS — T39311A Poisoning by propionic acid derivatives, accidental (unintentional), initial encounter: Secondary | ICD-10-CM | POA: Diagnosis present

## 2023-08-18 DIAGNOSIS — R109 Unspecified abdominal pain: Secondary | ICD-10-CM | POA: Diagnosis not present

## 2023-08-18 DIAGNOSIS — E8721 Acute metabolic acidosis: Secondary | ICD-10-CM | POA: Diagnosis not present

## 2023-08-18 DIAGNOSIS — E119 Type 2 diabetes mellitus without complications: Secondary | ICD-10-CM

## 2023-08-18 DIAGNOSIS — M1612 Unilateral primary osteoarthritis, left hip: Secondary | ICD-10-CM | POA: Diagnosis not present

## 2023-08-18 DIAGNOSIS — J440 Chronic obstructive pulmonary disease with acute lower respiratory infection: Secondary | ICD-10-CM | POA: Diagnosis present

## 2023-08-18 DIAGNOSIS — Z952 Presence of prosthetic heart valve: Secondary | ICD-10-CM | POA: Diagnosis not present

## 2023-08-18 DIAGNOSIS — M8588 Other specified disorders of bone density and structure, other site: Secondary | ICD-10-CM | POA: Diagnosis not present

## 2023-08-18 DIAGNOSIS — N1831 Chronic kidney disease, stage 3a: Secondary | ICD-10-CM | POA: Diagnosis not present

## 2023-08-18 DIAGNOSIS — R918 Other nonspecific abnormal finding of lung field: Secondary | ICD-10-CM | POA: Diagnosis not present

## 2023-08-18 DIAGNOSIS — I2489 Other forms of acute ischemic heart disease: Secondary | ICD-10-CM | POA: Diagnosis present

## 2023-08-18 DIAGNOSIS — I5043 Acute on chronic combined systolic (congestive) and diastolic (congestive) heart failure: Secondary | ICD-10-CM | POA: Diagnosis not present

## 2023-08-18 DIAGNOSIS — M109 Gout, unspecified: Secondary | ICD-10-CM | POA: Diagnosis not present

## 2023-08-18 DIAGNOSIS — N183 Chronic kidney disease, stage 3 unspecified: Secondary | ICD-10-CM | POA: Diagnosis not present

## 2023-08-18 DIAGNOSIS — I4892 Unspecified atrial flutter: Secondary | ICD-10-CM | POA: Diagnosis present

## 2023-08-18 DIAGNOSIS — R578 Other shock: Secondary | ICD-10-CM | POA: Diagnosis present

## 2023-08-18 DIAGNOSIS — R6521 Severe sepsis with septic shock: Secondary | ICD-10-CM | POA: Diagnosis not present

## 2023-08-18 DIAGNOSIS — I272 Pulmonary hypertension, unspecified: Secondary | ICD-10-CM | POA: Diagnosis not present

## 2023-08-18 DIAGNOSIS — I132 Hypertensive heart and chronic kidney disease with heart failure and with stage 5 chronic kidney disease, or end stage renal disease: Secondary | ICD-10-CM | POA: Diagnosis present

## 2023-08-18 DIAGNOSIS — K921 Melena: Secondary | ICD-10-CM | POA: Diagnosis present

## 2023-08-18 DIAGNOSIS — F05 Delirium due to known physiological condition: Secondary | ICD-10-CM | POA: Diagnosis not present

## 2023-08-18 DIAGNOSIS — K922 Gastrointestinal hemorrhage, unspecified: Secondary | ICD-10-CM | POA: Diagnosis present

## 2023-08-18 DIAGNOSIS — R1030 Lower abdominal pain, unspecified: Secondary | ICD-10-CM

## 2023-08-18 DIAGNOSIS — D6832 Hemorrhagic disorder due to extrinsic circulating anticoagulants: Secondary | ICD-10-CM | POA: Diagnosis present

## 2023-08-18 DIAGNOSIS — I50810 Right heart failure, unspecified: Secondary | ICD-10-CM | POA: Diagnosis not present

## 2023-08-18 DIAGNOSIS — K7200 Acute and subacute hepatic failure without coma: Secondary | ICD-10-CM | POA: Diagnosis not present

## 2023-08-18 DIAGNOSIS — Z7901 Long term (current) use of anticoagulants: Secondary | ICD-10-CM | POA: Diagnosis not present

## 2023-08-18 DIAGNOSIS — K2970 Gastritis, unspecified, without bleeding: Secondary | ICD-10-CM | POA: Diagnosis not present

## 2023-08-18 DIAGNOSIS — M10031 Idiopathic gout, right wrist: Secondary | ICD-10-CM | POA: Diagnosis not present

## 2023-08-18 DIAGNOSIS — M25551 Pain in right hip: Secondary | ICD-10-CM | POA: Diagnosis not present

## 2023-08-18 DIAGNOSIS — J9601 Acute respiratory failure with hypoxia: Secondary | ICD-10-CM | POA: Diagnosis not present

## 2023-08-18 DIAGNOSIS — Z452 Encounter for adjustment and management of vascular access device: Secondary | ICD-10-CM | POA: Diagnosis not present

## 2023-08-18 DIAGNOSIS — M25552 Pain in left hip: Secondary | ICD-10-CM | POA: Diagnosis not present

## 2023-08-18 DIAGNOSIS — I480 Paroxysmal atrial fibrillation: Secondary | ICD-10-CM | POA: Diagnosis not present

## 2023-08-18 DIAGNOSIS — B0229 Other postherpetic nervous system involvement: Secondary | ICD-10-CM | POA: Diagnosis present

## 2023-08-18 DIAGNOSIS — J690 Pneumonitis due to inhalation of food and vomit: Secondary | ICD-10-CM | POA: Diagnosis not present

## 2023-08-18 DIAGNOSIS — I771 Stricture of artery: Secondary | ICD-10-CM | POA: Diagnosis not present

## 2023-08-18 DIAGNOSIS — A419 Sepsis, unspecified organism: Secondary | ICD-10-CM | POA: Diagnosis not present

## 2023-08-18 DIAGNOSIS — J449 Chronic obstructive pulmonary disease, unspecified: Secondary | ICD-10-CM | POA: Diagnosis not present

## 2023-08-18 HISTORY — DX: Gastrointestinal hemorrhage, unspecified: K92.2

## 2023-08-18 HISTORY — DX: Abnormal electrocardiogram (ECG) (EKG): R94.31

## 2023-08-18 LAB — COMPREHENSIVE METABOLIC PANEL
ALT: 17 U/L (ref 0–44)
AST: 55 U/L — ABNORMAL HIGH (ref 15–41)
Albumin: 2.2 g/dL — ABNORMAL LOW (ref 3.5–5.0)
Alkaline Phosphatase: 132 U/L — ABNORMAL HIGH (ref 38–126)
Anion gap: 14 (ref 5–15)
BUN: 58 mg/dL — ABNORMAL HIGH (ref 8–23)
CO2: 19 mmol/L — ABNORMAL LOW (ref 22–32)
Calcium: 6.6 mg/dL — ABNORMAL LOW (ref 8.9–10.3)
Chloride: 106 mmol/L (ref 98–111)
Creatinine, Ser: 4.34 mg/dL — ABNORMAL HIGH (ref 0.44–1.00)
GFR, Estimated: 10 mL/min — ABNORMAL LOW (ref >60)
Glucose, Bld: 66 mg/dL — ABNORMAL LOW (ref 70–99)
Potassium: 4.6 mmol/L (ref 3.5–5.1)
Sodium: 139 mmol/L (ref 135–145)
Total Bilirubin: 1.3 mg/dL — ABNORMAL HIGH (ref 0.0–1.2)
Total Protein: 6 g/dL — ABNORMAL LOW (ref 6.5–8.1)

## 2023-08-18 LAB — BASIC METABOLIC PANEL
Anion gap: 17 — ABNORMAL HIGH (ref 5–15)
BUN: 58 mg/dL — ABNORMAL HIGH (ref 8–23)
CO2: 17 mmol/L — ABNORMAL LOW (ref 22–32)
Calcium: 6.8 mg/dL — ABNORMAL LOW (ref 8.9–10.3)
Chloride: 105 mmol/L (ref 98–111)
Creatinine, Ser: 4.27 mg/dL — ABNORMAL HIGH (ref 0.44–1.00)
GFR, Estimated: 10 mL/min — ABNORMAL LOW (ref >60)
Glucose, Bld: 64 mg/dL — ABNORMAL LOW (ref 70–99)
Potassium: 4.2 mmol/L (ref 3.5–5.1)
Sodium: 139 mmol/L (ref 135–145)

## 2023-08-18 LAB — IRON AND TIBC
Iron: 54 ug/dL (ref 28–170)
Saturation Ratios: 20 % (ref 10.4–31.8)
TIBC: 265 ug/dL (ref 250–450)
UIBC: 211 ug/dL

## 2023-08-18 LAB — BLOOD GAS, VENOUS
Acid-base deficit: 5.1 mmol/L — ABNORMAL HIGH (ref 0.0–2.0)
Bicarbonate: 21.2 mmol/L (ref 20.0–28.0)
O2 Saturation: 26.5 %
Patient temperature: 36.8
pCO2, Ven: 43 mm[Hg] — ABNORMAL LOW (ref 44–60)
pH, Ven: 7.3 (ref 7.25–7.43)
pO2, Ven: <31 mm[Hg] — CL (ref 32–45)

## 2023-08-18 LAB — CBC
HCT: 27.6 % — ABNORMAL LOW (ref 36.0–46.0)
HCT: 26.9 % — ABNORMAL LOW (ref 36.0–46.0)
HCT: 25.9 % — ABNORMAL LOW (ref 36.0–46.0)
Hemoglobin: 8.4 g/dL — ABNORMAL LOW (ref 12.0–15.0)
Hemoglobin: 8.4 g/dL — ABNORMAL LOW (ref 12.0–15.0)
Hemoglobin: 8.1 g/dL — ABNORMAL LOW (ref 12.0–15.0)
MCH: 28.3 pg (ref 26.0–34.0)
MCH: 28.4 pg (ref 26.0–34.0)
MCH: 28.7 pg (ref 26.0–34.0)
MCHC: 30.4 g/dL (ref 30.0–36.0)
MCHC: 31.2 g/dL (ref 30.0–36.0)
MCHC: 31.3 g/dL (ref 30.0–36.0)
MCV: 92.9 fL (ref 80.0–100.0)
MCV: 90.9 fL (ref 80.0–100.0)
MCV: 91.8 fL (ref 80.0–100.0)
Platelets: 409 10*3/uL — ABNORMAL HIGH (ref 150–400)
Platelets: 430 10*3/uL — ABNORMAL HIGH (ref 150–400)
Platelets: 406 10*3/uL — ABNORMAL HIGH (ref 150–400)
RBC: 2.97 MIL/uL — ABNORMAL LOW (ref 3.87–5.11)
RBC: 2.96 MIL/uL — ABNORMAL LOW (ref 3.87–5.11)
RBC: 2.82 MIL/uL — ABNORMAL LOW (ref 3.87–5.11)
RDW: 15.9 % — ABNORMAL HIGH (ref 11.5–15.5)
RDW: 16 % — ABNORMAL HIGH (ref 11.5–15.5)
RDW: 16 % — ABNORMAL HIGH (ref 11.5–15.5)
WBC: 9.8 10*3/uL (ref 4.0–10.5)
WBC: 10.5 10*3/uL (ref 4.0–10.5)
WBC: 11.6 10*3/uL — ABNORMAL HIGH (ref 4.0–10.5)
nRBC: 0 % (ref 0.0–0.2)
nRBC: 0.5 % — ABNORMAL HIGH (ref 0.0–0.2)
nRBC: 0.5 % — ABNORMAL HIGH (ref 0.0–0.2)

## 2023-08-18 LAB — FERRITIN: Ferritin: 127 ng/mL (ref 11–307)

## 2023-08-18 LAB — I-STAT CG4 LACTIC ACID, ED
Lactic Acid, Venous: 2 mmol/L (ref 0.5–1.9)
Lactic Acid, Venous: 2.4 mmol/L (ref 0.5–1.9)

## 2023-08-18 LAB — CBC WITH DIFFERENTIAL/PLATELET
Abs Immature Granulocytes: 0.19 10*3/uL — ABNORMAL HIGH (ref 0.00–0.07)
Basophils Absolute: 0.1 10*3/uL (ref 0.0–0.1)
Basophils Relative: 1 %
Eosinophils Absolute: 0.3 10*3/uL (ref 0.0–0.5)
Eosinophils Relative: 3 %
HCT: 29.3 % — ABNORMAL LOW (ref 36.0–46.0)
Hemoglobin: 8.7 g/dL — ABNORMAL LOW (ref 12.0–15.0)
Immature Granulocytes: 2 %
Lymphocytes Relative: 24 %
Lymphs Abs: 2.4 10*3/uL (ref 0.7–4.0)
MCH: 27.7 pg (ref 26.0–34.0)
MCHC: 29.7 g/dL — ABNORMAL LOW (ref 30.0–36.0)
MCV: 93.3 fL (ref 80.0–100.0)
Monocytes Absolute: 1.2 10*3/uL — ABNORMAL HIGH (ref 0.1–1.0)
Monocytes Relative: 12 %
Neutro Abs: 5.6 10*3/uL (ref 1.7–7.7)
Neutrophils Relative %: 58 %
Platelets: 434 10*3/uL — ABNORMAL HIGH (ref 150–400)
RBC: 3.14 MIL/uL — ABNORMAL LOW (ref 3.87–5.11)
RDW: 16 % — ABNORMAL HIGH (ref 11.5–15.5)
WBC: 9.9 10*3/uL (ref 4.0–10.5)
nRBC: 0.2 % (ref 0.0–0.2)

## 2023-08-18 LAB — GLUCOSE, CAPILLARY
Glucose-Capillary: 29 mg/dL — CL (ref 70–99)
Glucose-Capillary: 32 mg/dL — CL (ref 70–99)
Glucose-Capillary: >600 mg/dL (ref 70–99)
Glucose-Capillary: 88 mg/dL (ref 70–99)
Glucose-Capillary: 36 mg/dL — CL (ref 70–99)
Glucose-Capillary: 41 mg/dL — CL (ref 70–99)
Glucose-Capillary: 104 mg/dL — ABNORMAL HIGH (ref 70–99)
Glucose-Capillary: 88 mg/dL (ref 70–99)
Glucose-Capillary: 113 mg/dL — ABNORMAL HIGH (ref 70–99)
Glucose-Capillary: 110 mg/dL — ABNORMAL HIGH (ref 70–99)

## 2023-08-18 LAB — AMYLASE: Amylase: 73 U/L (ref 28–100)

## 2023-08-18 LAB — ECHOCARDIOGRAM LIMITED
Est EF: 55
Height: 65 in
MV VTI: 1.21 cm2
Single Plane A4C EF: 64 %
Weight: 1806.01 [oz_av]

## 2023-08-18 LAB — PROTIME-INR
INR: >10 (ref 0.8–1.2)
INR: 1.6 — ABNORMAL HIGH (ref 0.8–1.2)
Prothrombin Time: >90 s — ABNORMAL HIGH (ref 11.4–15.2)
Prothrombin Time: 19.1 s — ABNORMAL HIGH (ref 11.4–15.2)

## 2023-08-18 LAB — TECHNOLOGIST SMEAR REVIEW

## 2023-08-18 LAB — PHOSPHORUS: Phosphorus: 3.9 mg/dL (ref 2.5–4.6)

## 2023-08-18 LAB — APTT
aPTT: 170 s (ref 24–36)
aPTT: 66 s — ABNORMAL HIGH (ref 24–36)

## 2023-08-18 LAB — HEMOGLOBIN AND HEMATOCRIT, BLOOD
HCT: 29.4 % — ABNORMAL LOW (ref 36.0–46.0)
Hemoglobin: 9 g/dL — ABNORMAL LOW (ref 12.0–15.0)

## 2023-08-18 LAB — LIPASE, BLOOD: Lipase: 19 U/L (ref 11–51)

## 2023-08-18 LAB — MAGNESIUM
Magnesium: 1.5 mg/dL — ABNORMAL LOW (ref 1.7–2.4)
Magnesium: 1.4 mg/dL — ABNORMAL LOW (ref 1.7–2.4)

## 2023-08-18 LAB — MRSA NEXT GEN BY PCR, NASAL: MRSA by PCR Next Gen: NOT DETECTED

## 2023-08-18 LAB — BRAIN NATRIURETIC PEPTIDE: B Natriuretic Peptide: 547.9 pg/mL — ABNORMAL HIGH (ref 0.0–100.0)

## 2023-08-18 LAB — HEPARIN LEVEL (UNFRACTIONATED): Heparin Unfractionated: <0.1 [IU]/mL — ABNORMAL LOW (ref 0.30–0.70)

## 2023-08-18 LAB — TROPONIN I (HIGH SENSITIVITY)
Troponin I (High Sensitivity): 121 ng/L (ref <18)
Troponin I (High Sensitivity): 198 ng/L (ref <18)

## 2023-08-18 LAB — HEMOGLOBIN A1C
Hgb A1c MFr Bld: 6 % — ABNORMAL HIGH (ref 4.8–5.6)
Mean Plasma Glucose: 125.5 mg/dL

## 2023-08-18 LAB — PREPARE RBC (CROSSMATCH)

## 2023-08-18 MED ORDER — SODIUM CHLORIDE 0.9 % IV SOLN
2.0000 g | INTRAVENOUS | Status: DC
  Administered 2023-08-18: 2 g via INTRAVENOUS
  Filled 2023-08-18: qty 20

## 2023-08-18 MED ORDER — NOREPINEPHRINE 4 MG/250ML-% IV SOLN: 2.0000 ug/min | INTRAVENOUS | Status: DC

## 2023-08-18 MED ORDER — HEPARIN (PORCINE) 25000 UT/250ML-% IV SOLN
900.0000 [IU]/h | INTRAVENOUS | Status: AC
  Administered 2023-08-18: 800 [IU]/h via INTRAVENOUS
  Filled 2023-08-18: qty 250

## 2023-08-18 MED ORDER — SODIUM CHLORIDE 0.9% FLUSH: 10.0000 mL | INTRAVENOUS | Status: DC | PRN

## 2023-08-18 MED ORDER — DEXTROSE 50 % IV SOLN
INTRAVENOUS | Status: AC
  Administered 2023-08-18: 12.5 g via INTRAVENOUS
  Filled 2023-08-18: qty 50

## 2023-08-18 MED ORDER — ORAL CARE MOUTH RINSE: 15.0000 mL | OROMUCOSAL | Status: DC | PRN

## 2023-08-18 MED ORDER — DEXTROSE 50 % IV SOLN: 25.0000 g | INTRAVENOUS | Status: AC

## 2023-08-18 MED ORDER — LACTATED RINGERS IV BOLUS
500.0000 mL | Freq: Once | INTRAVENOUS | Status: AC
  Administered 2023-08-18: 500 mL via INTRAVENOUS

## 2023-08-18 MED ORDER — FLUTICASONE FUROATE-VILANTEROL 100-25 MCG/ACT IN AEPB
1.0000 | INHALATION_SPRAY | Freq: Every day | RESPIRATORY_TRACT | Status: DC
  Administered 2023-08-19 – 2023-09-07 (×17): 1 via RESPIRATORY_TRACT
  Filled 2023-08-18 (×2): qty 28

## 2023-08-18 MED ORDER — SODIUM CHLORIDE 0.9% FLUSH
10.0000 mL | Freq: Two times a day (BID) | INTRAVENOUS | Status: DC
  Administered 2023-08-18 – 2023-08-19 (×3): 10 mL
  Administered 2023-08-20: 40 mL
  Administered 2023-08-21 (×2): 10 mL

## 2023-08-18 MED ORDER — SODIUM CHLORIDE 0.9 % IV SOLN
250.0000 mL | INTRAVENOUS | Status: AC
Start: 2023-08-18 — End: 2023-08-19

## 2023-08-18 MED ORDER — SODIUM CHLORIDE 0.9% IV SOLUTION: Freq: Once | INTRAVENOUS | Status: DC

## 2023-08-18 MED ORDER — PROTHROMBIN COMPLEX CONC HUMAN 500 UNITS IV KIT
2160.0000 [IU] | PACK | Status: AC
  Administered 2023-08-18: 2160 [IU] via INTRAVENOUS
  Filled 2023-08-18: qty 2160

## 2023-08-18 MED ORDER — HYDROMORPHONE HCL 1 MG/ML PO LIQD
0.5000 mg | Freq: Once | ORAL | Status: DC
  Filled 2023-08-18: qty 1

## 2023-08-18 MED ORDER — PANTOPRAZOLE SODIUM 40 MG IV SOLR
40.0000 mg | Freq: Two times a day (BID) | INTRAVENOUS | Status: DC
  Administered 2023-08-18 – 2023-08-23 (×11): 40 mg via INTRAVENOUS
  Filled 2023-08-18 (×11): qty 10

## 2023-08-18 MED ORDER — UMECLIDINIUM BROMIDE 62.5 MCG/ACT IN AEPB
1.0000 | INHALATION_SPRAY | Freq: Every day | RESPIRATORY_TRACT | Status: DC
  Administered 2023-08-19 – 2023-09-07 (×17): 1 via RESPIRATORY_TRACT
  Filled 2023-08-18 (×3): qty 7

## 2023-08-18 MED ORDER — SODIUM CHLORIDE 0.9 % IV SOLN: INTRAVENOUS | Status: DC

## 2023-08-18 MED ORDER — DEXTROSE 50 % IV SOLN
INTRAVENOUS | Status: AC
  Administered 2023-08-18: 25 g via INTRAVENOUS
  Filled 2023-08-18: qty 50

## 2023-08-18 MED ORDER — HYDROMORPHONE HCL 1 MG/ML IJ SOLN
0.5000 mg | INTRAMUSCULAR | Status: DC | PRN
  Administered 2023-08-18 – 2023-08-21 (×15): 0.5 mg via INTRAVENOUS
  Filled 2023-08-18 (×15): qty 0.5

## 2023-08-18 MED ORDER — ONDANSETRON HCL 4 MG/2ML IJ SOLN
4.0000 mg | Freq: Four times a day (QID) | INTRAMUSCULAR | Status: DC | PRN
  Administered 2023-08-18 – 2023-08-28 (×2): 4 mg via INTRAVENOUS
  Filled 2023-08-18 (×2): qty 2

## 2023-08-18 MED ORDER — MAGNESIUM SULFATE 4 GM/100ML IV SOLN
4.0000 g | Freq: Once | INTRAVENOUS | Status: AC
  Administered 2023-08-18: 4 g via INTRAVENOUS
  Filled 2023-08-18: qty 100

## 2023-08-18 MED ORDER — CHLORHEXIDINE GLUCONATE CLOTH 2 % EX PADS
6.0000 | MEDICATED_PAD | Freq: Every day | CUTANEOUS | Status: DC
  Administered 2023-08-18 – 2023-09-07 (×22): 6 via TOPICAL

## 2023-08-18 MED ORDER — DEXTROSE IN LACTATED RINGERS 5 % IV SOLN
INTRAVENOUS | Status: DC
Start: 2023-08-18 — End: 2023-08-19

## 2023-08-18 MED ORDER — POLYETHYLENE GLYCOL 3350 17 G PO PACK: 17.0000 g | PACK | Freq: Every day | ORAL | Status: DC | PRN

## 2023-08-18 MED ORDER — HYDROMORPHONE HCL 1 MG/ML PO LIQD: 0.5000 mg | Freq: Once | ORAL | Status: DC

## 2023-08-18 MED ORDER — INSULIN ASPART 100 UNIT/ML IJ SOLN
0.0000 [IU] | INTRAMUSCULAR | Status: DC
  Administered 2023-08-19: 3 [IU] via SUBCUTANEOUS
  Administered 2023-08-20 – 2023-08-21 (×4): 2 [IU] via SUBCUTANEOUS
  Administered 2023-08-21: 3 [IU] via SUBCUTANEOUS
  Administered 2023-08-25 (×2): 2 [IU] via SUBCUTANEOUS
  Administered 2023-08-26 (×3): 3 [IU] via SUBCUTANEOUS
  Administered 2023-08-26 – 2023-08-27 (×2): 2 [IU] via SUBCUTANEOUS
  Administered 2023-08-27: 3 [IU] via SUBCUTANEOUS
  Administered 2023-08-27: 2 [IU] via SUBCUTANEOUS
  Administered 2023-08-28: 3 [IU] via SUBCUTANEOUS
  Administered 2023-08-29 – 2023-08-31 (×5): 2 [IU] via SUBCUTANEOUS
  Administered 2023-09-01 (×2): 3 [IU] via SUBCUTANEOUS
  Administered 2023-09-01: 2 [IU] via SUBCUTANEOUS
  Administered 2023-09-01: 3 [IU] via SUBCUTANEOUS
  Administered 2023-09-01: 2 [IU] via SUBCUTANEOUS
  Administered 2023-09-02: 8 [IU] via SUBCUTANEOUS
  Administered 2023-09-02 (×2): 2 [IU] via SUBCUTANEOUS
  Administered 2023-09-02: 3 [IU] via SUBCUTANEOUS
  Administered 2023-09-03: 2 [IU] via SUBCUTANEOUS
  Administered 2023-09-03 (×2): 3 [IU] via SUBCUTANEOUS
  Administered 2023-09-04: 2 [IU] via SUBCUTANEOUS
  Administered 2023-09-04: 5 [IU] via SUBCUTANEOUS
  Administered 2023-09-04: 8 [IU] via SUBCUTANEOUS
  Administered 2023-09-05 (×2): 2 [IU] via SUBCUTANEOUS
  Administered 2023-09-05: 3 [IU] via SUBCUTANEOUS
  Administered 2023-09-06: 2 [IU] via SUBCUTANEOUS
  Administered 2023-09-06 (×2): 3 [IU] via SUBCUTANEOUS
  Administered 2023-09-06 – 2023-09-07 (×3): 2 [IU] via SUBCUTANEOUS
  Administered 2023-09-07: 3 [IU] via SUBCUTANEOUS

## 2023-08-18 MED ORDER — VITAMIN K1 10 MG/ML IJ SOLN
10.0000 mg | Freq: Once | INTRAMUSCULAR | Status: AC
  Administered 2023-08-18: 10 mg via INTRAVENOUS
  Filled 2023-08-18 (×2): qty 1

## 2023-08-18 MED ORDER — DOCUSATE SODIUM 100 MG PO CAPS: 100.0000 mg | ORAL_CAPSULE | Freq: Two times a day (BID) | ORAL | Status: DC | PRN

## 2023-08-18 MED ORDER — HYDROMORPHONE HCL 1 MG/ML IJ SOLN: 0.5000 mg | INTRAMUSCULAR | Status: DC | PRN

## 2023-08-18 NOTE — TOC Initial Note (Signed)
 Transition of Care [facility]) - Initial/Assessment Note    Patient Details  Name: Kim Anthony [name]: [id number] Date of Birth: [birth year]  Transition of Care [facility]) CM/SW Contact:    Marliss Coots, LCSW Phone Number: 08/18/2023, 1:20 PM  Clinical Narrative:                  1:20 PM Per chart review, patient resides at private residence with adult children but is from Urgent Care via [facility] for rectal bleeding and general malaise. Patient has SNF history Psychiatric nurse).    Barriers to Discharge: Continued Medical Work up   Patient Goals and CMS Choice            Expected Discharge Plan and Services       Living arrangements for the past 2 months: Single Family Home                                      Prior Living Arrangements/Services Living arrangements for the past 2 months: Single Family Home Lives with:: Adult Children Patient language and need for interpreter reviewed:: Yes        Need for Family Participation in Patient Care: Yes (Comment) Care giver support system in place?: Yes (comment)   Criminal Activity/Legal Involvement Pertinent to Current Situation/Hospitalization: No - Comment as needed  Activities of Daily Living   ADL Screening (condition at time of admission) Independently performs ADLs?: Yes (appropriate for developmental age) Is the patient deaf or have difficulty hearing?: No Does the patient have difficulty seeing, even when wearing glasses/contacts?: No Does the patient have difficulty concentrating, remembering, or making decisions?: No  Permission Sought/Granted Permission sought to share information with : Family Supports Permission granted to share information with : No (Contact information on chart)  Share Information with NAME: [place] Brasington     Permission granted to share info w Relationship: Daughter  Permission granted to share info w Contact Information: [phone]  Emotional Assessment       Orientation: : Oriented  to Self, Oriented to Place, Oriented to  Time, Oriented to Situation Alcohol / Substance Use: Not Applicable Psych Involvement: No (comment)  Admission diagnosis:  Melena [K92.1] GI bleed [K92.2] Symptomatic anemia [D64.9] Patient Active Problem List   Diagnosis Date Noted   GI bleed 08/18/2023   QT prolongation 08/18/2023   ABLA (acute blood loss anemia) 08/18/2023   Anemia 08/17/2023   History of mitral valve prosthesis 07/31/2023   Atrial flutter (HCC) 07/31/2023   Right ventricular failure (HCC) 07/31/2023   Acute hypoxic respiratory failure (HCC) 07/30/2023   Acute on chronic combined systolic and diastolic heart failure (HCC) 07/30/2023   CKD (chronic kidney disease) stage 3, GFR 30-59 ml/min (HCC) 07/30/2023   Gout 07/30/2023   Post herpetic neuralgia 07/30/2023   Atrial fibrillation with RVR (HCC) 07/29/2023   Arthritis of right wrist due to gout 07/23/2023   Wrist arthritis 07/23/2023   Asymmetrical sensorineural hearing loss 01/27/2023   Bilateral hearing loss 12/01/2022   Hypokalemia 11/18/2022   Thrombocytosis 10/16/2022   Hyperkalemia 10/08/2022   Acute renal failure (HCC) 10/07/2022   Alteration in anticoagulation 09/25/2022   History of mitral valve replacement with mechanical valve 09/24/2022   Warfarin anticoagulation 09/24/2022   Closed fracture of right hip (HCC) 09/23/2022   Closed intertrochanteric fracture of right femur, initial encounter (HCC) 09/22/2022   H/O mitral valve replacement with mechanical valve 09/22/2022  Chronic HFrEF (heart failure with reduced ejection fraction) (HCC) 09/22/2022   Endometrial thickening on ultrasound 07/07/2022   Gastroesophageal reflux disease without esophagitis 06/29/2022   Chronic pain of both knees 06/29/2022   Vaginal bleeding 04/01/2022   A-fib (HCC) 03/13/2022   Facial edema 03/07/2022   Atypical atrial flutter (HCC)    Combined systolic and diastolic heart failure (HCC) 02/19/2022   CHF (congestive heart  failure) (HCC) 02/19/2022   Combined systolic and diastolic congestive heart failure (HCC) 02/18/2022   Acute pulmonary edema (HCC)    Edema of both lower extremities 01/13/2022   Hypotension 01/13/2022   Subacute cough 11/28/2021   COPD exacerbation (HCC) 11/05/2021   Conjunctival hemorrhage of left eye 06/25/2021   Cellulitis of right knee 05/12/2021   Osteoarthritis of knees, bilateral 05/12/2021   Osteoarthritis of left knee 04/24/2021   Bunion 03/18/2021   Inflammatory polyarthropathy (HCC) 03/04/2021   Seronegative inflammatory arthritis 02/17/2021   High risk medication use 02/17/2021   Elevated transaminase level 01/27/2021   Pain in right hand 01/16/2021   Acute idiopathic gout of multiple sites 09/30/2020   Pain in joint of left knee 09/27/2020   Right wrist pain 05/29/2020   Ganglion of right wrist 05/29/2020   Chronic pain syndrome 01/17/2020   Monoarthritis of wrist 10/18/2019   Ganglion cyst 09/21/2019   Hip pain 08/23/2019   Thoracic ascending aortic aneurysm (HCC) 07/05/2019   Macrocytic anemia 02/16/2019   Breast cancer (HCC)    Acquired absence of left breast 12/20/2018   COPD (chronic obstructive pulmonary disease) (HCC) 11/25/2017   S/P mitral valve replacement    Chronic anticoagulation    Tobacco use disorder 02/25/2017   Lymphadenopathy, mediastinal 02/25/2017   Essential hypertension 02/01/2016   Allergic rhinitis 02/01/2016   Healthcare maintenance 01/30/2016   Chronic low back pain 07/22/2015   Pulmonary embolism (HCC) 07/11/2015   Biventricular heart failure with reduced left ventricular function (HCC) 07/11/2015   Headache, common migraine 07/11/2015   HLD (hyperlipidemia) 07/11/2015   PCP:  Kathleen Lime, MD Pharmacy:   [facility] [address] [place], [place] - [address] [place] [address] Phone: [phone] Fax: [phone]     Social Drivers of Health (SDOH) Social History: SDOH  Screenings   Food Insecurity: No Food Insecurity (08/18/2023)  Housing: Low Risk  (08/18/2023)  Transportation Needs: No Transportation Needs (08/18/2023)  Utilities: Not At Risk (08/18/2023)  Alcohol Screen: Low Risk  (12/01/2022)  Depression (PHQ2-9): Low Risk  (12/01/2022)  Financial Resource Strain: Low Risk  (12/01/2022)  Physical Activity: Inactive (12/01/2022)  Social Connections: Patient Declined (08/18/2023)  Stress: Stress Concern Present (12/01/2022)  Tobacco Use: High Risk (08/18/2023)   SDOH Interventions:     Readmission Risk Interventions    02/19/2022    3:36 PM  Readmission Risk Prevention Plan  Transportation Screening Complete  PCP or Specialist Appt within 3-5 Days Complete  HRI or Home Care Consult Complete  Social Work Consult for Recovery Care Planning/Counseling Complete  Palliative Care Screening Not Applicable  Medication Review Oceanographer) Complete

## 2023-08-18 NOTE — Progress Notes (Signed)
 eLink Physician-Brief Progress Note Patient Name: Kim Anthony DOB: 1951/04/14 [name]: [id number]   Date of Service  08/18/2023  HPI/Events of Note  72/F with mechanical mitral valve on coumadin, atrial fibrillation, presenting with bloody bowel movements.    H&H 9.4/31.2 INR >10  BP 97/57, MAP 67, HR 85, RR 21, O2 sats 91%  eICU Interventions  Continue protonix 40mg  IV BID.  Trend H&H q6hrs and transfuse as needed.  Pt given vitamin K. Discussed with Dr. Allena Katz.  Will give prothrombin complex concentrate.  SCDs for DVT prophylaxis.      Intervention Category Evaluation Type: New Patient Evaluation  Larinda Buttery 08/18/2023, 4:04 AM

## 2023-08-18 NOTE — Progress Notes (Addendum)
 NAME:  Kim Anthony, [name]:  [id number], DOB:  Mar 18, 1951, LOS: 0 ADMISSION DATE:  08/17/2023, CONSULTATION DATE:  08/18/23 REFERRING MD:  Dr. Lily Kocher, CHIEF COMPLAINT:  GI bleed   History of Present Illness:  73 year old female patient with multiple comorbidities as below, most prominently with mechanical mitral valve on chronic Coumadin therapy, A-fib present with lower GI bleed, bloody BMs, dizziness and weakness.   Of note patient has had previous episodes of melena, had colonoscopy in 2019 showing 20 mm polyp in internal hemorrhoids. Patient admits to recent NSAID use for past few weeks BID, has chronic pain, multiple complaints, critical care asked to see this patient due to symptomatic anemia and concern for worsening GI bleed.   Case is discussed with the hospitalist over the phone, is concerned about her IV access she currently has 2 peripheral IVs, we can continue to use this for blood products and IV fluids.  RN notified bedside by myself that if patient's IV access worsens and we will place a central line bedside.  Otherwise, patient can be admitted to the ICU for closer observation.  Pertinent  Medical History   has a past medical history of Arthritis, Asthma, Breast cancer (HCC) (2001), CHF (congestive heart failure) (HCC), COPD (chronic obstructive pulmonary disease) (HCC), Coronary artery disease, GERD (gastroesophageal reflux disease), History of mitral valve replacement with mechanical valve, HLD (hyperlipidemia), Hypertension, and Pulmonary embolism (HCC) (2021).   Significant Hospital Events: Including procedures, antibiotic start and stop dates in addition to other pertinent events   2/25 admitted to IMTS for hypotension and melena likely acute GI bleed, transfer to ICU due to limited IV access and close monitoring    Interim History / Subjective:  Alert this morning. Endorses chronic pain of left groin/hip area with hx of shingles. No rashes. No dysuria or urinary symptoms.    Objective   Blood pressure 93/61, pulse 87, temperature 98.4 F (36.9 C), temperature source Oral, resp. rate 16, height 5\' 5"  (1.651 m), weight 51.2 kg, SpO2 90%.        Intake/Output Summary (Last 24 hours) at 08/18/2023 1128 Last data filed at 08/18/2023 1000 Gross per 24 hour  Intake 2244.69 ml  Output --  Net 2244.69 ml   Filed Weights   08/17/23 2012 08/18/23 0338 08/18/23 0356  Weight: 74.4 kg 51.2 kg 51.2 kg    Examination: General: alert, in no acute distress HENT: AT/[place] Lungs: normal effort on room air, clear bilaterally  Cardiovascular: RRR, mechanical valve click heard  Abdomen: soft, diffuse tenderness, non-distended, no guarding or rebound Extremities: no LE edema Neuro: alert and oriented  Skin: no obvious rashes or new lesions of abdomen or left hip   Resolved Hospital Problem list     Assessment & Plan:  Hypotension  Acute blood loss anemia on chronic anemia  Acute upper GI bleed Melena Recent NSAID use Chronic warfarin use  -Still having melanotic stools  -Hgb 9.4>>8.4>>9.0 s/p PRBC (BL 10-11) -has 2 x PIV, will need additional large bore IV today -PPI IV 40 mg BID -trend CBC Q6H  -transfuse if Hgb <7 -was not on levophed  -avoid NSAIDs  -start D5 LR 73ml/hr for hypoglycemia and currently NPO    AKI on CKD 3a AGMA Lactic acidosis -admit Scr 4.14 (BL 1-1.3), suspect in hypovolemia and recent daily NSAID use  -lactic trend down s/p IVF -trend BMP -getting IVF -pending UA -avoid nephrotoxic agents and renally dose meds  Elevated troponin  -troponin 113>>121  -trend trop  to peak  -no ischemic changes seen on EKG -denies chest pain, suspect demand ischemia   Chronic biventricular heart failure HTN -pending repeat echo ordered on admission -hold home antihypertensives due to hypotension -hold home diuretic, not volume overloaded on exam    Hx of Afib  Hx of mechanical mitral valve on Warfarin  Prolonged QT -EKG showed NSR -INR on  admit >10, s/p vitamin K and PCC -hold home metoprolol and warfarin -repeat INR subtherapeutic, will start heparin drip per pharmacy     COPD -continue hospital formulary Trelegy inhaler   Chronic back pain Postherpetic neuralgia Chronic opiate use -hold home Norco/Vicodin 5-325mg  (Takes 6 tablets/day) given NPO -hold home gabapentin given AKI  -IV dialudid 0.5 mg Q4H PRN    Best Practice (right click and "Reselect all SmartList Selections" daily)   Diet/type: NPO DVT prophylaxis systemic heparin Pressure ulcer(s): N/A GI prophylaxis: PPI Lines: N/A Foley:  N/A Code Status:  full code Last date of multidisciplinary goals of care discussion [ updated daughter ]  Labs   CBC: Recent Labs  Lab 08/17/23 2035 08/18/23 0137 08/18/23 0234 08/18/23 0758  WBC 9.4 9.9 9.8  --   NEUTROABS 5.1 5.6  --   --   HGB 9.4* 8.7* 8.4* 9.0*  HCT 31.2* 29.3* 27.6* 29.4*  MCV 92.9 93.3 92.9  --   PLT 419* 434* 409*  --     Basic Metabolic Panel: Recent Labs  Lab 08/17/23 2035 08/18/23 0137 08/18/23 0234  NA 137 139 139  K 4.1 4.6 4.2  CL 101 106 105  CO2 19* 19* 17*  GLUCOSE 61* 66* 64*  BUN 52* 58* 58*  CREATININE 4.14* 4.34* 4.27*  CALCIUM 7.0* 6.6* 6.8*  MG  --  1.5* 1.4*  PHOS  --   --  3.9   GFR: Estimated Creatinine Clearance: 9.5 mL/min (A) (by C-G formula based on SCr of 4.27 mg/dL (H)). Recent Labs  Lab 08/17/23 2035 08/18/23 0026 08/18/23 0137 08/18/23 0234 08/18/23 0251  WBC 9.4  --  9.9 9.8  --   LATICACIDVEN  --  2.4*  --   --  2.0*    Liver Function Tests: Recent Labs  Lab 08/17/23 2035 08/18/23 0137  AST 56* 55*  ALT 18 17  ALKPHOS 140* 132*  BILITOT 1.5* 1.3*  PROT 6.7 6.0*  ALBUMIN 2.4* 2.2*   Recent Labs  Lab 08/18/23 0212  LIPASE 19  AMYLASE 73   No results for input(s): "AMMONIA" in the last 168 hours.  ABG    Component Value Date/Time   PHART 7.345 (L) 01/09/2021 1341   PCO2ART 44.2 01/09/2021 1341   PO2ART 78 (L)  01/09/2021 1341   HCO3 21.2 08/18/2023 0758   TCO2 33 (H) 11/17/2022 0709   ACIDBASEDEF 5.1 (H) 08/18/2023 0758   O2SAT 26.5 08/18/2023 0758     Coagulation Profile: Recent Labs  Lab 08/18/23 0137 08/18/23 0758  INR >10.0* 1.6*    Cardiac Enzymes: No results for input(s): "CKTOTAL", "CKMB", "CKMBINDEX", "TROPONINI" in the last 168 hours.  HbA1C: Hgb A1c MFr Bld  Date/Time Value Ref Range Status  08/18/2023 02:12 AM 6.0 (H) 4.8 - 5.6 % Final    Comment:    (NOTE) Pre diabetes:          5.7%-6.4%  Diabetes:              >6.4%  Glycemic control for   <7.0% adults with diabetes   09/21/2022 10:24 AM 5.5 4.8 -  5.6 % Final    Comment:    (NOTE)         Prediabetes: 5.7 - 6.4         Diabetes: >6.4         Glycemic control for adults with diabetes: <7.0     CBG: Recent Labs  Lab 08/18/23 0557 08/18/23 0807 08/18/23 0813 08/18/23 0841 08/18/23 1107  GLUCAP 88 36* 41* 104* 88    Review of Systems:   Endorses melena, abdominal pain, chronic hip and back pain  Denies chest pain, n/v   Past Medical History:  She,  has a past medical history of Arthritis, Asthma, Breast cancer (HCC) (2001), CHF (congestive heart failure) (HCC), COPD (chronic obstructive pulmonary disease) (HCC), Coronary artery disease, GERD (gastroesophageal reflux disease), History of mitral valve replacement with mechanical valve, HLD (hyperlipidemia), Hypertension, and Pulmonary embolism (HCC) (2021).   Surgical History:   Past Surgical History:  Procedure Laterality Date   APPLICATION OF A-CELL OF CHEST/ABDOMEN Left 01/27/2019   Procedure: APPLICATION OF A-CELL OF CHEST;  Surgeon: Peggye Form, DO;  Location: [facility] OR;  Service: Plastics;  Laterality: Left;   APPLICATION OF WOUND VAC N/A 01/27/2019   Procedure: APPLICATION OF WOUND VAC;  Surgeon: Peggye Form, DO;  Location: [facility] OR;  Service: Plastics;  Laterality: N/A;   BREAST SURGERY Left 2001   for CA   CARDIOVERSION N/A  02/26/2022   Procedure: CARDIOVERSION;  Surgeon: Little Ishikawa, MD;  Location: [facility] ENDOSCOPY;  Service: Cardiovascular;  Laterality: N/A;   COLONOSCOPY WITH PROPOFOL N/A 06/07/2018   Procedure: COLONOSCOPY WITH PROPOFOL;  Surgeon: Charlott Rakes, MD;  Location: [facility] ENDOSCOPY;  Service: Endoscopy;  Laterality: N/A;   DEBRIDEMENT AND CLOSURE WOUND Left 12/28/2018   Procedure: Debridement And Closure Wound;  Surgeon: Abigail Miyamoto, MD;  Location: [facility] OR;  Service: General;  Laterality: Left;   INCISION AND DRAINAGE OF WOUND Left 01/27/2019   Procedure: IRRIGATION AND DEBRIDEMENT OF CHEST WALL;  Surgeon: Peggye Form, DO;  Location: [facility] OR;  Service: Plastics;  Laterality: Left;   INTRAMEDULLARY (IM) NAIL INTERTROCHANTERIC N/A 09/23/2022   Procedure: INTRAMEDULLARY (IM) NAIL INTERTROCHANTERIC;  Surgeon: Tarry Kos, MD;  Location: [facility] OR;  Service: Orthopedics;  Laterality: N/A;   KNEE ARTHROSCOPY WITH MEDIAL MENISECTOMY Left 08/06/2021   Procedure: KNEE ARTHROSCOPY WITH PARTIAL MEDIAL MENISECTOMY, DEBRIDEMENT;  Surgeon: Jene Every, MD;  Location: [facility] ORS;  Service: Orthopedics;  Laterality: Left;  1 HR   LATISSIMUS FLAP TO BREAST Left 01/18/2019   Procedure: LEFT LATISSIMUS FLAP TO BREAST;  Surgeon: Peggye Form, DO;  Location: [facility] OR;  Service: Plastics;  Laterality: Left;   MASTECTOMY Left    MITRAL VALVE REPLACEMENT     mechanical   POLYPECTOMY  06/07/2018   Procedure: POLYPECTOMY;  Surgeon: Charlott Rakes, MD;  Location: [facility] ENDOSCOPY;  Service: Endoscopy;;   RIGHT/LEFT HEART CATH AND CORONARY ANGIOGRAPHY N/A 01/09/2021   Procedure: RIGHT/LEFT HEART CATH AND CORONARY ANGIOGRAPHY;  Surgeon: Dolores Patty, MD;  Location: [facility] INVASIVE CV LAB;  Service: Cardiovascular;  Laterality: N/A;   WOUND DEBRIDEMENT Left 12/28/2018   Procedure: EXCISION OF LEFT CHRONIC CHEST WALL WOUND;  Surgeon: Abigail Miyamoto, MD;  Location: [facility] OR;  Service: General;  Laterality:  Left;     Social History:   reports that she has been smoking cigarettes. She has a 3 pack-year smoking history. She has never used smokeless tobacco. She reports current alcohol use of about 3.0 standard drinks of alcohol  per week. She reports that she does not use drugs.   Family History:  Her family history includes Breast cancer (age of onset: 73) in her maternal aunt; Cancer in her father; Heart attack in an other family member; Hypertension in her father and mother.   Allergies Allergies  Allergen Reactions   Lisinopril Swelling   Acetaminophen-Codeine Itching   Propoxyphene Itching    Darvocet   Tape Rash    Plastic   Tramadol Itching, Nausea And Vomiting and Rash     Home Medications  Prior to Admission medications   Medication Sig Start Date End Date Taking? Authorizing Provider  albuterol (VENTOLIN HFA) 108 (90 Base) MCG/ACT inhaler INHALE 2 PUFFS BY MOUTH EVERY 6 HOURS AS NEEDED FOR WHEEZING FOR SHORTNESS OF BREATH 07/15/23  Yes Kathleen Lime, MD  FARXIGA 10 MG TABS tablet Take 1 tablet by mouth once daily 06/03/23  Yes Kathleen Lime, MD  fluticasone (FLONASE) 50 MCG/ACT nasal spray Place 2 sprays into both nostrils daily. Patient taking differently: Place 1 spray into both nostrils daily. 05/17/23 05/16/24 Yes Kathleen Lime, MD  gabapentin (NEURONTIN) 800 MG tablet Take 1 tablet (800 mg total) by mouth 3 (three) times daily. 03/01/23  Yes Lovorn, Aundra Millet, MD  HYDROcodone-acetaminophen (NORCO/VICODIN) 5-325 MG tablet Take 1 tablet by mouth every 4 (four) hours as needed for moderate pain (pain score 4-6). 08/05/23  Yes Lovorn, Aundra Millet, MD  losartan (COZAAR) 25 MG tablet Take 0.5 tablets (12.5 mg total) by mouth daily. 08/10/23 11/08/23 Yes Milford, Anderson Malta, FNP  metoprolol succinate (TOPROL-XL) 25 MG 24 hr tablet Take 1 tablet (25 mg total) by mouth 2 (two) times daily. 08/01/23  Yes Monna Fam, MD  potassium chloride (KLOR-CON M) 10 MEQ tablet Take 6 tablets (60 mEq total) by  mouth daily. Patient taking differently: Take 60 mEq by mouth 2 (two) times daily. Take 3 tablets in the morning and 3 tablets in the afternoon 08/10/23 03/07/24 Yes Milford, Anderson Malta, FNP  simvastatin (ZOCOR) 20 MG tablet Take 1 tablet (20 mg total) by mouth daily. 12/03/22  Yes Willette Cluster, MD  tetrahydrozoline 0.05 % ophthalmic solution Place 1 drop into both eyes 3 (three) times daily.   Yes [provider]  torsemide (DEMADEX) 100 MG tablet Take 1 tablet (100 mg total) by mouth daily. 08/10/23  Yes Milford, [place], FNP  TRELEGY ELLIPTA 100-62.5-25 MCG/ACT AEPB INHALE 1 PUFF ONCE DAILY INTO  THE  LUNGS 07/15/23  Yes Kathleen Lime, MD  warfarin (COUMADIN) 5 MG tablet Take one-and-one-half (1 & 1/2) of your 5 mg peach colored warfarin tablets on Mondays and Thursdays. All other days, take only one (1) tablet. Patient taking differently: Take 5 mg by mouth daily. 04/26/23  Yes Elicia Lamp, RPH-CPP     Critical care time:

## 2023-08-18 NOTE — Inpatient Diabetes Management (Signed)
 Inpatient Diabetes Program Recommendations  AACE/ADA: New Consensus Statement on Inpatient Glycemic Control (2015)  Target Ranges:  Prepandial:   less than 140 mg/dL      Peak postprandial:   less than 180 mg/dL (1-2 hours)      Critically ill patients:  140 - 180 mg/dL   Lab Results  Component Value Date   GLUCAP 104 (H) 08/18/2023   HGBA1C 6.0 (H) 08/18/2023    Latest Reference Range & Units 08/18/23 03:42 08/18/23 04:01 08/18/23 04:21 08/18/23 05:57 08/18/23 08:07 08/18/23 08:13 08/18/23 08:41  Glucose-Capillary 70 - 99 mg/dL 32 (LL) 29 (LL) >725 (HH) ?error 88 36 (LL) 41 (LL) 104 (H)  (LL): Data is critically low (HH): Data is critically high (H): Data is abnormally high  Outpatient Diabetes medications: Farxiga 10 mg daily Current orders for Inpatient glycemic control: Novolog 0-15 units q 4 hrs.  Inpatient Diabetes Program Recommendations:   Noted patient taking Marcelline Deist @ home since December and ? Impact CO2 17 and anion gap 17. Please consider: -Decrease Novolog correction to 0-6 units q 4 hrs.  Will follow during hospitalization. Thank you, Billy Fischer. Cheyeanne Roadcap, RN, MSN, CDCES  Diabetes Coordinator Inpatient Glycemic Control Team Team Pager [phone] (8am-5pm) 08/18/2023 10:07 AM

## 2023-08-18 NOTE — Consult Note (Signed)
 [facility] Consultation Note  Referring Provider: [facility] Primary Care Physician:  Kathleen Lime, MD Primary Gastroenterologist:  Dr. Bosie Clos  Reason for Consultation:  melena, anemia  HPI: Kim Anthony is a 73 y.o. female admitted melena and anemia.  On warfarin for prosthetic metal valve.  INR > 10 upon arrival.  Has been taking NSAIDs/predisone lately for gout.  Does not appear to have been taking PPI at time of admission.   Past Medical History:  Diagnosis Date   Arthritis    Asthma    Breast cancer (HCC) 2001   Left Breast Cancer   CHF (congestive heart failure) (HCC)    COPD (chronic obstructive pulmonary disease) (HCC)    Coronary artery disease    GERD (gastroesophageal reflux disease)    History of mitral valve replacement with mechanical valve    HLD (hyperlipidemia)    Hypertension    Pulmonary embolism (HCC) 2021    Past Surgical History:  Procedure Laterality Date   APPLICATION OF A-CELL OF CHEST/ABDOMEN Left 01/27/2019   Procedure: APPLICATION OF A-CELL OF CHEST;  Surgeon: Peggye Form, DO;  Location: [facility] OR;  Service: Plastics;  Laterality: Left;   APPLICATION OF WOUND VAC N/A 01/27/2019   Procedure: APPLICATION OF WOUND VAC;  Surgeon: Peggye Form, DO;  Location: [facility] OR;  Service: Plastics;  Laterality: N/A;   BREAST SURGERY Left 2001   for CA   CARDIOVERSION N/A 02/26/2022   Procedure: CARDIOVERSION;  Surgeon: Little Ishikawa, MD;  Location: [facility] ENDOSCOPY;  Service: Cardiovascular;  Laterality: N/A;   COLONOSCOPY WITH PROPOFOL N/A 06/07/2018   Procedure: COLONOSCOPY WITH PROPOFOL;  Surgeon: Charlott Rakes, MD;  Location: [facility] ENDOSCOPY;  Service: Endoscopy;  Laterality: N/A;   DEBRIDEMENT AND CLOSURE WOUND Left 12/28/2018   Procedure: Debridement And Closure Wound;  Surgeon: Abigail Miyamoto, MD;  Location: [facility] OR;  Service: General;  Laterality: Left;   INCISION AND DRAINAGE OF WOUND Left 01/27/2019    Procedure: IRRIGATION AND DEBRIDEMENT OF CHEST WALL;  Surgeon: Peggye Form, DO;  Location: [facility] OR;  Service: Plastics;  Laterality: Left;   INTRAMEDULLARY (IM) NAIL INTERTROCHANTERIC N/A 09/23/2022   Procedure: INTRAMEDULLARY (IM) NAIL INTERTROCHANTERIC;  Surgeon: Tarry Kos, MD;  Location: [facility] OR;  Service: Orthopedics;  Laterality: N/A;   KNEE ARTHROSCOPY WITH MEDIAL MENISECTOMY Left 08/06/2021   Procedure: KNEE ARTHROSCOPY WITH PARTIAL MEDIAL MENISECTOMY, DEBRIDEMENT;  Surgeon: Jene Every, MD;  Location: [facility] ORS;  Service: Orthopedics;  Laterality: Left;  1 HR   LATISSIMUS FLAP TO BREAST Left 01/18/2019   Procedure: LEFT LATISSIMUS FLAP TO BREAST;  Surgeon: Peggye Form, DO;  Location: [facility] OR;  Service: Plastics;  Laterality: Left;   MASTECTOMY Left    MITRAL VALVE REPLACEMENT     mechanical   POLYPECTOMY  06/07/2018   Procedure: POLYPECTOMY;  Surgeon: Charlott Rakes, MD;  Location: [facility] ENDOSCOPY;  Service: Endoscopy;;   RIGHT/LEFT HEART CATH AND CORONARY ANGIOGRAPHY N/A 01/09/2021   Procedure: RIGHT/LEFT HEART CATH AND CORONARY ANGIOGRAPHY;  Surgeon: Dolores Patty, MD;  Location: [facility] INVASIVE CV LAB;  Service: Cardiovascular;  Laterality: N/A;   WOUND DEBRIDEMENT Left 12/28/2018   Procedure: EXCISION OF LEFT CHRONIC CHEST WALL WOUND;  Surgeon: Abigail Miyamoto, MD;  Location: [facility] OR;  Service: General;  Laterality: Left;    Prior to Admission medications   Medication Sig Start Date End Date Taking? Authorizing Provider  albuterol (VENTOLIN HFA) 108 (90 Base) MCG/ACT inhaler INHALE 2 PUFFS BY MOUTH EVERY 6 HOURS  AS NEEDED FOR WHEEZING FOR SHORTNESS OF BREATH 07/15/23  Yes Kathleen Lime, MD  FARXIGA 10 MG TABS tablet Take 1 tablet by mouth once daily 06/03/23  Yes Kathleen Lime, MD  fluticasone [facility]) 50 MCG/ACT nasal spray Place 2 sprays into both nostrils daily. Patient taking differently: Place 1 spray into both nostrils daily. 05/17/23 05/16/24 Yes Kathleen Lime, MD  gabapentin (NEURONTIN) 800 MG tablet Take 1 tablet (800 mg total) by mouth 3 (three) times daily. 03/01/23  Yes Lovorn, Aundra Millet, MD  HYDROcodone-acetaminophen (NORCO/VICODIN) 5-325 MG tablet Take 1 tablet by mouth every 4 (four) hours as needed for moderate pain (pain score 4-6). 08/05/23  Yes Lovorn, Aundra Millet, MD  losartan (COZAAR) 25 MG tablet Take 0.5 tablets (12.5 mg total) by mouth daily. 08/10/23 11/08/23 Yes Milford, Anderson Malta, FNP  metoprolol succinate (TOPROL-XL) 25 MG 24 hr tablet Take 1 tablet (25 mg total) by mouth 2 (two) times daily. 08/01/23  Yes Monna Fam, MD  potassium chloride (KLOR-CON M) 10 MEQ tablet Take 6 tablets (60 mEq total) by mouth daily. Patient taking differently: Take 60 mEq by mouth 2 (two) times daily. Take 3 tablets in the morning and 3 tablets in the afternoon 08/10/23 03/07/24 Yes Milford, Anderson Malta, FNP  simvastatin (ZOCOR) 20 MG tablet Take 1 tablet (20 mg total) by mouth daily. 12/03/22  Yes Willette Cluster, MD  tetrahydrozoline 0.05 % ophthalmic solution Place 1 drop into both eyes 3 (three) times daily.   Yes [provider]  torsemide (DEMADEX) 100 MG tablet Take 1 tablet (100 mg total) by mouth daily. 08/10/23  Yes Milford, Chene[place], FNP  TRELEGY ELLIPTA 100-62.5-25 MCG/ACT AEPB INHALE 1 PUFF ONCE DAILY INTO  THE  LUNGS 07/15/23  Yes Kathleen Lime, MD  warfarin (COUMADIN) 5 MG tablet Take one-and-one-half (1 & 1/2) of your 5 mg peach colored warfarin tablets on Mondays and Thursdays. All other days, take only one (1) tablet. Patient taking differently: Take 5 mg by mouth daily. 04/26/23  Yes Elicia Lamp, RPH-CPP    Current Facility-Administered Medications  Medication Dose Route Frequency Provider Last Rate Last Admin   0.9 %  sodium chloride infusion  250 mL Intravenous Continuous Patel, Hamish, DO       Chlorhexidine Gluconate Cloth 2 % PADS 6 each  6 each Topical Daily Patel, Hamish, DO   6 each at 08/18/23 0940   dextrose 5 % in lactated  ringers infusion   Intravenous Continuous Tawkaliyar, Roya, DO 50 mL/hr at 08/18/23 1200 Infusion Verify at 08/18/23 1200   docusate sodium (COLACE) capsule 100 mg  100 mg Oral BID PRN Patel, Hamish, DO       fluticasone furoate-vilanterol (BREO ELLIPTA) 100-25 MCG/ACT 1 puff  1 puff Inhalation Daily Rana Snare, DO       And   umeclidinium bromide (INCRUSE ELLIPTA) 62.5 MCG/ACT 1 puff  1 puff Inhalation Daily Rana Snare, DO       heparin ADULT infusion 100 units/mL (25000 units/294mL)  800 Units/hr Intravenous Continuous Tawkaliyar, Roya, DO 8 mL/hr at 08/18/23 1200 800 Units/hr at 08/18/23 1200   HYDROmorphone (DILAUDID) injection 0.5 mg  0.5 mg Intravenous Q4H PRN Tawkaliyar, Roya, DO   0.5 mg at 08/18/23 0943   insulin aspart (novoLOG) injection 0-15 Units  0-15 Units Subcutaneous Q4H Patel, Hamish, DO       norepinephrine (LEVOPHED) 4mg  in (0.016 mg/mL) premix infusion  2-10 mcg/min Intravenous Titrated Patel, Hamish, DO       ondansetron (  ZOFRAN) injection 4 mg  4 mg Intravenous Q6H PRN Allena Katz, Hamish, DO   4 mg at 08/18/23 9528   Oral care mouth rinse  15 mL Mouth Rinse PRN Patel, Hamish, DO       pantoprazole (PROTONIX) injection 40 mg  40 mg Intravenous Q12H Morene Crocker, MD   40 mg at 08/18/23 1010   polyethylene glycol (MIRALAX / GLYCOLAX) packet 17 g  17 g Oral Daily PRN Madaline Brilliant, DO        Allergies as of 08/17/2023 - Review Complete 08/17/2023  Allergen Reaction Noted   Lisinopril Swelling 01/07/2021   Acetaminophen-codeine Itching 07/10/2015   Propoxyphene Itching 01/18/2019   Tape Rash 03/09/2019   Tramadol Itching, Nausea And Vomiting, and Rash 07/10/2015    Family History  Problem Relation Age of Onset   Hypertension Mother    Cancer Father    Hypertension Father    Breast cancer Maternal Aunt 50   Heart attack Other     Social History   Socioeconomic History   Marital status: Single    Spouse name: Not on file   Number of children:  Not on file   Years of education: Not on file   Highest education level: Not on file  Occupational History   Not on file  Tobacco Use   Smoking status: Every Day    Current packs/day: 0.10    Average packs/day: 0.1 packs/day for 30.0 years (3.0 ttl pk-yrs)    Types: Cigarettes   Smokeless tobacco: Never   Tobacco comments:    5 cigs per day  Vaping Use   Vaping status: Never Used  Substance and Sexual Activity   Alcohol use: Yes    Alcohol/week: 3.0 standard drinks of alcohol    Types: 3 Standard drinks or equivalent per week    Comment: beer- ocasional   Drug use: No   Sexual activity: Not Currently    Birth control/protection: None  Other Topics Concern   Not on file  Social History Narrative   ** Merged History Encounter **       Social Drivers of Health   Financial Resource Strain: Low Risk  (12/01/2022)   Overall Financial Resource Strain (CARDIA)    Difficulty of Paying Living Expenses: Not very hard  Food Insecurity: No Food Insecurity (08/18/2023)   Hunger Vital Sign    Worried About Running Out of Food in the Last Year: Never true    Ran Out of Food in the Last Year: Never true  Transportation Needs: No Transportation Needs (08/18/2023)   PRAPARE - Administrator, Civil Service (Medical): No    Lack of Transportation (Non-Medical): No  Physical Activity: Inactive (12/01/2022)   Exercise Vital Sign    Days of Exercise per Week: 0 days    Minutes of Exercise per Session: 10 min  Stress: Stress Concern Present (12/01/2022)   [employer] of Occupational Health - Occupational Stress Questionnaire    Feeling of Stress : To some extent  Social Connections: Patient Declined (08/18/2023)   Social Connection and Isolation Panel [NHANES]    Frequency of Communication with Friends and Family: Patient declined    Frequency of Social Gatherings with Friends and Family: Patient declined    Attends Religious Services: Patient declined    Active Member of  Clubs or Organizations: Patient declined    Attends Banker Meetings: Patient declined    Marital Status: Patient declined  Intimate Partner Violence: Not At Risk (08/18/2023)  Humiliation, Afraid, Rape, and Kick questionnaire    Fear of Current or Ex-Partner: No    Emotionally Abused: No    Physically Abused: No    Sexually Abused: No    Review of Systems: As per HPI, all others negative  Physical Exam: Vital signs in last 24 hours: Temp:  [98.1 F (36.7 C)-99 F (37.2 C)] 98.4 F (36.9 C) (02/26 1109) Pulse Rate:  [74-87] 86 (02/26 1200) Resp:  [15-27] 18 (02/26 1200) BP: (55-150)/(36-128) 102/66 (02/26 1200) SpO2:  [90 %-100 %] 92 % (02/26 1200) Weight:  [51.2 kg-79.2 kg] 51.2 kg (02/26 0356) Last BM Date : 08/18/23 General:   Alert, overweight, chronically ill-appearing Head:  Normocephalic and atraumatic. Eyes:  Sclera clear, no icterus.   Conjunctiva pale Ears:  Normal auditory acuity. Nose:  No deformity, discharge,  or lesions. Mouth:  No deformity or lesions.  Oropharynx pale and dry Neck:  Supple; no masses or thyromegaly. Lungs: No visible distress Abdomen:  Soft protuberant, mild epigastric tenderness, No masses, hepatosplenomegaly or hernias noted.   Msk:  Symmetrical without gross deformities. Normal posture. Pulses:  Normal pulses noted. Extremities:  Without clubbing or edema. Neurologic:  Alert and  oriented x4; diffusely weak, otherwise grossly normal neurologically. Skin:  Intact without significant lesions or rashes. Psych:  Alert and cooperative. Normal mood and affect.   Lab Results: Recent Labs    08/17/23 2035 08/18/23 0137 08/18/23 0234 08/18/23 0758  WBC 9.4 9.9 9.8  --   HGB 9.4* 8.7* 8.4* 9.0*  HCT 31.2* 29.3* 27.6* 29.4*  PLT 419* 434* 409*  --    BMET Recent Labs    08/17/23 2035 08/18/23 0137 08/18/23 0234  NA 137 139 139  K 4.1 4.6 4.2  CL 101 106 105  CO2 19* 19* 17*  GLUCOSE 61* 66* 64*  BUN 52* 58* 58*   CREATININE 4.14* 4.34* 4.27*  CALCIUM 7.0* 6.6* 6.8*   LFT Recent Labs    08/18/23 0137  PROT 6.0*  ALBUMIN 2.2*  AST 55*  ALT 17  ALKPHOS 132*  BILITOT 1.3*   PT/INR Recent Labs    08/18/23 0137 08/18/23 0758  LABPROT >90.0* 19.1*  INR >10.0* 1.6*    Studies/Results: ECHOCARDIOGRAM LIMITED Result Date: 08/18/2023    ECHOCARDIOGRAM LIMITED REPORT   Patient Name:   CHARMAINE PLACIDO Date of Exam: 08/18/2023 Medical Rec #:  [id number]     Height:       65.0 in Accession #:    [id number]    Weight:       112.9 lb Date of Birth:  07-29-1950      BSA:          1.551 m Patient Age:    72 years      BP:           96/53 mmHg Patient Gender: F             HR:           82 bpm. Exam Location:  Inpatient Procedure: Limited Echo, Limited Color Doppler and Cardiac Doppler (Both            Spectral and Color Flow Doppler were utilized during procedure). Indications:    CHF  History:        Patient has prior history of Echocardiogram examinations, most                 recent 07/30/2023. CHF, COPD, Mitral Valve Disease,  Arrythmias:Atrial Fibrillation and Atrial Flutter; Risk                 Factors:Current Smoker.                  Mitral Valve: St. Jude mechanical valve valve is present in the                 mitral position.  Sonographer:    Amy Chionchio Referring Phys: [id number] CAROLYN GUILLOUD IMPRESSIONS  1. Left ventricular ejection fraction, by estimation, is 55%. The left ventricle has no regional wall motion abnormalities. Left ventricular diastolic parameters are indeterminate.  2. D-shaped interventricular septum suggestive of RV pressure/volume overload. Right ventricular systolic function is mildly reduced. The right ventricular size is severely enlarged.  3. Peak RV-RA gradient 25 mmHg. The IVC was not visualized.  4. Left atrial size was mildly dilated.  5. Right atrial size was severely dilated.  6. St Jude mechanical mitral valve. Mean gradient 4 mmHg, not significantly elevated. No  significant mitral regurgitation noted.  7. The tricuspid valve leaflets do not fully coapt. The tricuspid valve is abnormal. Tricuspid valve regurgitation is severe.  8. The aortic valve is tricuspid. There is mild calcification of the aortic valve. No stenosis or regurgitation noted.  9. Limited echo. FINDINGS  Left Ventricle: Left ventricular ejection fraction, by estimation, is 55%. The left ventricle has no regional wall motion abnormalities. Left ventricular diastolic parameters are indeterminate. Right Ventricle: D-shaped interventricular septum suggestive of RV pressure/volume overload. The right ventricular size is severely enlarged. Right ventricular systolic function is mildly reduced. Left Atrium: Left atrial size was mildly dilated. Right Atrium: Right atrial size was severely dilated. Mitral Valve: St Jude mechanical mitral valve. Mean gradient 4 mmHg, not significantly elevated. No significant mitral regurgitation noted. There is a St. Jude mechanical valve present in the mitral position. MV peak gradient, 8.0 mmHg. The mean mitral valve gradient is 4.0 mmHg. Tricuspid Valve: The tricuspid valve leaflets do not fully coapt. The tricuspid valve is abnormal. Tricuspid valve regurgitation is severe. Aortic Valve: The aortic valve is tricuspid. There is mild calcification of the aortic valve. Aortic valve regurgitation is not visualized. No aortic stenosis is present. Pulmonic Valve: The pulmonic valve was normal in structure. Pulmonic valve regurgitation is trivial. Aorta: The aortic root is normal in size and structure. LEFT VENTRICLE PLAX 2D LVOT diam:     2.10 cm LV SV:         37 LV SV Index:   24 LVOT Area:     3.46 cm  LV Volumes (MOD) LV vol d, MOD A4C: 49.7 ml LV vol s, MOD A4C: 17.9 ml LV SV MOD A4C:     49.7 ml RIGHT VENTRICLE RV Basal diam:  4.80 cm LEFT ATRIUM           Index        RIGHT ATRIUM           Index LA Vol (A4C): 53.3 ml 34.36 ml/m  RA Area:     41.10 cm                                     RA Volume:   181.00 ml 116.68 ml/m  AORTIC VALVE             PULMONIC VALVE LVOT Vmax:   82.00 cm/s  PV Vmax:  0.81 m/s LVOT Vmean:  50.200 cm/s PV Peak grad:     2.7 mmHg LVOT VTI:    0.108 m     PR End Diast Vel: 4.24 msec  AORTA Ao Asc diam: 3.50 cm MITRAL VALVE MV Area VTI:  1.21 cm   SHUNTS MV Peak grad: 8.0 mmHg   Systemic VTI:  0.11 m MV Mean grad: 4.0 mmHg   Systemic Diam: 2.10 cm MV Vmax:      1.41 m/s MV Vmean:     99.6 cm/s Dalton McleanMD Electronically signed by Wilfred Lacy Signature Date/Time: 08/18/2023/9:55:28 AM    Final    DG Abd 1 View Result Date: 08/18/2023 CLINICAL DATA:  Abdominal discomfort EXAM: ABDOMEN - 1 VIEW COMPARISON:  None available FINDINGS: The bowel gas pattern is normal. No radio-opaque calculi or other significant radiographic abnormality are seen. IMPRESSION: Negative. Electronically Signed   By: Charlett Nose M.D.   On: 08/18/2023 01:26   [place] EKG SITE RITE Result Date: 08/18/2023 If [facility] image not attached, placement could not be confirmed due to current cardiac rhythm.   Impression:   Melena with acute blood loss anemia. Chronic anticoagulation in setting of prosthetic mitral valve.  Supratherapeutic INR upon admission. NSAIDs.  Plan:   PPI.  Clear liquid diet. NPO after midnight. Follow CBC and PT/INR, transfuse/blood products as needed. Anticoagulation on hold. Endoscopy tomorrow. Risks (bleeding, infection, bowel perforation that could require surgery, sedation-related changes in cardiopulmonary systems), benefits (identification and possible treatment of source of symptoms, exclusion of certain causes of symptoms), and alternatives (watchful waiting, radiographic imaging studies, empiric medical treatment) of upper endoscopy (EGD) were explained to patient/family in detail and patient wishes to proceed.  [facility] will follow.   LOS: 0 days   Elder Davidian M  08/18/2023, 1:12 PM  Cell [phone] If no answer or  after 5 PM call [phone]

## 2023-08-18 NOTE — Hospital Course (Addendum)
 Upper GI bleed 2/2 gastritis Acute blood loss anemia [name] initially presented to our ED due to concerns for multiple episodes of dark brown stool and abdominal pain, dizziness and found to be in shock, upgraded to ICU and later downgraded to the cardiac unit floor after she became stable. GI was consulted who scoped her and found no active bleeding but did see evidence of gastritis. She was then stared on PPI and trended her CBC throughout her hospitalization. At discharge, her Hgb was stabilized at 9.1 and she had no signs of bleeding.  Paroxysmal atrial flutter Hx atrial fibrillation Patient presented with tachycardia, found to be in atrial flutter with RVR. Treated with IV amiodarone with good response. Cardiology was consulted who started oral amiodarone, however due to concerns of QT prolongation this was discontinued and instead home metoprolol 25mg  was increased to 25mg  BID. Patient's HR remained in normal sinus rhythm. Patient will follow up with cardiology to evaluate for recurrent atrial flutter and refer to EP to see if she will benefit from ablation. Discharged on warfarin 5 g daily with recommendation to follow up with Dr Alexandria Lodge for INR recheck   Acute on chronic biventricular heart failure HFrEF EF 50% Acute hypoxic respiratory failure Patient presented with dyspnea and bilateral LEE, BNP 375, CXR without signs of acute cardiopulmonary process. Required 2L [place]. Echo without significant changes from previous. Patient was given IV Lasix with good urinary output and later transitioned to oral Lasix. Her hospital was complicated with acute and chronic RV failure with low Co-ox. Cardiology assisted and initiated her on milrinone. This helped improve perfusion and was stopped at the appropriate time. She was later switched to torsemide 100mg  which she responded appropriately with good urine output. [name] volume status improved significantly and was stabilized for discharge. Her torsemide  was decreased to 60 mg daily and Spironolactone was added to her home regimen per cardiology.  AKI CKD stage 4 Creatinine increased to 1.41 from baseline ~1. Likely cardiorenal etiology 2/2 acute on chronic systolic heart failure. Creatinine improved 1.46 on discharge. Repeat BMP recommended at F/U    Post-herpetic neuralgia Home gabapentin continued at reduced dose due to decreased renal function   Hx mitral valve replacement: continued home warfarin. Discharged on Warfarin 5 mg  COPD: Home Breo Ellipta and Incruse Ellipta continued Chronic low back pain: continued home Norco/Vicodin 5-325mg  q6h PRN HTN: held home antihypertensives HLD: continued home simvastatin 20mg  Gout: day 2 of prednisone taper with 5 days 40mg , 5 days 30mg , 5 days 20mg , 5 days 10mg . Total of 20 days. GERD: Continued home protonix 40mg  daily

## 2023-08-18 NOTE — Plan of Care (Signed)

## 2023-08-18 NOTE — Progress Notes (Addendum)
 PHARMACY - ANTICOAGULATION CONSULT NOTE  Pharmacy Consult for heparin Indication:  Mechanical mitral valve replacement  Allergies  Allergen Reactions   Lisinopril Swelling   Acetaminophen-Codeine Itching   Propoxyphene Itching    Darvocet   Tape Rash    Plastic   Tramadol Itching, Nausea And Vomiting and Rash    Patient Measurements: Height: 5\' 5"  (165.1 cm) Weight: 51.2 kg (112 lb 14 oz) IBW/kg (Calculated) : 57 Heparin Dosing Weight: TBW  Vital Signs: Temp: 98.2 F (36.8 C) (02/26 0739) Temp Source: Oral (02/26 0739) BP: 102/90 (02/26 1000) Pulse Rate: 83 (02/26 1000)  Labs: Recent Labs    08/17/23 2035 08/18/23 0022 08/18/23 0137 08/18/23 0234 08/18/23 0758  HGB 9.4*  --  8.7* 8.4* 9.0*  HCT 31.2*  --  29.3* 27.6* 29.4*  PLT 419*  --  434* 409*  --   APTT  --   --  170*  --   --   LABPROT  --   --  >90.0*  --  19.1*  INR  --   --  >10.0*  --  1.6*  CREATININE 4.14*  --  4.34* 4.27*  --   TROPONINIHS 113* 121*  --   --   --     Estimated Creatinine Clearance: 9.5 mL/min (A) (by C-G formula based on SCr of 4.27 mg/dL (H)).   Medical History: Past Medical History:  Diagnosis Date   Arthritis    Asthma    Breast cancer (HCC) 2001   Left Breast Cancer   CHF (congestive heart failure) (HCC)    COPD (chronic obstructive pulmonary disease) (HCC)    Coronary artery disease    GERD (gastroesophageal reflux disease)    History of mitral valve replacement with mechanical valve    HLD (hyperlipidemia)    Hypertension    Pulmonary embolism (HCC) 2021    Medications:  Medications Prior to Admission  Medication Sig Dispense Refill Last Dose/Taking   albuterol (VENTOLIN HFA) 108 (90 Base) MCG/ACT inhaler INHALE 2 PUFFS BY MOUTH EVERY 6 HOURS AS NEEDED FOR WHEEZING FOR SHORTNESS OF BREATH 9 g 0 08/17/2023 Noon   FARXIGA 10 MG TABS tablet Take 1 tablet by mouth once daily 90 tablet 0 08/17/2023 Morning   fluticasone (FLONASE) 50 MCG/ACT nasal spray Place 2 sprays  into both nostrils daily. (Patient taking differently: Place 1 spray into both nostrils daily.) 11.1 mL 3 08/17/2023 Morning   gabapentin (NEURONTIN) 800 MG tablet Take 1 tablet (800 mg total) by mouth 3 (three) times daily. 90 tablet 5 08/17/2023   HYDROcodone-acetaminophen (NORCO/VICODIN) 5-325 MG tablet Take 1 tablet by mouth every 4 (four) hours as needed for moderate pain (pain score 4-6). 150 tablet 0 08/17/2023 Morning   losartan (COZAAR) 25 MG tablet Take 0.5 tablets (12.5 mg total) by mouth daily. 45 tablet 3 08/17/2023 Morning   metoprolol succinate (TOPROL-XL) 25 MG 24 hr tablet Take 1 tablet (25 mg total) by mouth 2 (two) times daily. 60 tablet 2 08/17/2023 Morning   potassium chloride (KLOR-CON M) 10 MEQ tablet Take 6 tablets (60 mEq total) by mouth daily. (Patient taking differently: Take 60 mEq by mouth 2 (two) times daily. Take 3 tablets in the morning and 3 tablets in the afternoon) 120 tablet 6 08/17/2023 Noon   simvastatin (ZOCOR) 20 MG tablet Take 1 tablet (20 mg total) by mouth daily. 90 tablet 2 08/17/2023 Morning   tetrahydrozoline 0.05 % ophthalmic solution Place 1 drop into both eyes 3 (three) times daily.  08/17/2023   torsemide (DEMADEX) 100 MG tablet Take 1 tablet (100 mg total) by mouth daily. 90 tablet 3 08/17/2023 Morning   TRELEGY ELLIPTA 100-62.5-25 MCG/ACT AEPB INHALE 1 PUFF ONCE DAILY INTO  THE  LUNGS 60 each 0 08/17/2023 Morning   warfarin (COUMADIN) 5 MG tablet Take one-and-one-half (1 & 1/2) of your 5 mg peach colored warfarin tablets on Mondays and Thursdays. All other days, take only one (1) tablet. (Patient taking differently: Take 5 mg by mouth daily.) 32 tablet 2 08/15/2023   Scheduled:   Chlorhexidine Gluconate Cloth  6 each Topical Daily   insulin aspart  0-15 Units Subcutaneous Q4H   pantoprazole (PROTONIX) IV  40 mg Intravenous Q12H    Assessment: Kim Anthony is a 73 y.o.female with significant past medical history of  HFrEF [LVEF 35-40% 05/11/2022],  mechanical mitral valve placement [2001] on chronic warfarin therapy [INR goal 2.5-3.5], paroxymal A-fib, CAD, HTN, HLD, hx breast cancer s/p left mastectomy and radiation/chemo, who presented to ED due to bloody stools and dizziness.   Pharmacy has been consulted to cautiously initiate heparin in setting of mechanical mitral valve replacement. Reportedly still having melenic stools. Discussed with CCM provider, Dr. Celine Mans and Dr. Sherrilee Gilles. Will proceed with IV heparin without bolus, and cautiously titrate to therapeutic level. Patient is hemodynamically stable, status post 2 u PRBC, with Hgb roughly stable at 9.4. Patient also with AKI, CrCl of 9.5 mL/min today.   Goal of Therapy:  Heparin level 0.3-0.7 units/ml aPTT 66-102 seconds Monitor platelets by anticoagulation protocol: Yes   Plan:  Start heparin infusion at 800 units/hr Check anti-Xa level in 8 hours and daily while on heparin Will also trend aPTT in setting of warfarin use Monitor closely for signs of worsening bleeding or clot  Kim Anthony 08/18/2023,10:57 AM

## 2023-08-18 NOTE — Consult Note (Signed)
 NAME:  Kim Anthony, [name]:  [id number], DOB:  [birth year], LOS: 0 ADMISSION DATE:  08/17/2023, CONSULTATION DATE:  08/18/23 REFERRING MD:  Dr. Lily Kocher, CHIEF COMPLAINT:  GI bleed   History of Present Illness:   73 year old female patient with multiple comorbidities as below, most prominently with mechanical mitral valve on chronic Coumadin therapy, A-fib present with lower GI bleed, bloody BMs, dizziness and weakness.  Of note patient has had previous episodes of melena, had colonoscopy in 2019 showing 20 mm polyp in internal hemorrhoids. Patient admits to recent NSAID use, has chronic pain, multiple complaints, critical care asked to see this patient due to symptomatic anemia and concern for worsening GI bleed.  Case is discussed with the hospitalist over the phone, is concerned about her IV access she currently has 2 peripheral IVs, we can continue to use this for blood products and IV fluids.  RN notified bedside by myself that if patient's IV access worsens and we will place a central line bedside.  Otherwise, patient can be admitted to the ICU for closer observation.  Pertinent  Medical History   has a past medical history of Arthritis, Asthma, Breast cancer (HCC) (2001), CHF (congestive heart failure) (HCC), COPD (chronic obstructive pulmonary disease) (HCC), Coronary artery disease, GERD (gastroesophageal reflux disease), History of mitral valve replacement with mechanical valve, HLD (hyperlipidemia), Hypertension, and Pulmonary embolism (HCC) (2021).   Significant Hospital Events: Including procedures, antibiotic start and stop dates in addition to other pertinent events     Interim History / Subjective:    Objective   Blood pressure (!) 86/58, pulse 81, temperature 98.2 F (36.8 C), temperature source Oral, resp. rate 15, height 5\' 5"  (1.651 m), weight 74.4 kg, SpO2 94%.        Intake/Output Summary (Last 24 hours) at 08/18/2023 0241 Last data filed at 08/17/2023 1959 Gross per 24  hour  Intake 400 ml  Output --  Net 400 ml   Filed Weights   08/17/23 2012  Weight: 74.4 kg    Examination: Physical Exam Constitutional:      Appearance: Normal appearance.  HENT:     Head: Normocephalic and atraumatic.     Right Ear: External ear normal.     Left Ear: External ear normal.     Nose: Nose normal.     Mouth/Throat:     Mouth: Mucous membranes are dry.  Eyes:     Extraocular Movements: Extraocular movements intact.     Pupils: Pupils are equal, round, and reactive to light.  Cardiovascular:     Rate and Rhythm: Tachycardia present.  Abdominal:     General: Abdomen is flat.     Palpations: Abdomen is soft.  Musculoskeletal:     Cervical back: Normal range of motion and neck supple.  Skin:    General: Skin is warm and dry.  Neurological:     Mental Status: She is alert.  Psychiatric:        Mood and Affect: Mood normal.        Behavior: Behavior normal.      Resolved Hospital Problem list     Assessment & Plan:   Acute lower GI bleed -Resume IV NS, IV Protonix 40 mg twice daily Patient initially admitted to the hospitalist service/GI already notified, recommending continuing Protonix. Will monitor H&H every 6 hours. Undergoing anemia workup as well.  Blood products as indicated. INR recommending to be below 2 thus may receive vitamin K and FFP.  Acute blood loss anemia -  H&H currently stable, monitor and provide blood products as indicated.  Patient has multiple comorbidities, including heart failure reduced EF, CKD stage V, hypertension, mechanical mitral valve. Labs are indicative of underlying chronic disease. Monitor and replace electrolytes as indicated  Further recommendations pending clinical course Discussed with Hospitalist.  Best Practice (right click and "Reselect all SmartList Selections" daily)   Diet/type: NPO DVT prophylaxis SCD Pressure ulcer(s): N/A GI prophylaxis: PPI Lines: N/A Foley:  N/A Code Status:  full  code Last date of multidisciplinary goals of care discussion [08/18/23]  Labs   CBC: Recent Labs  Lab 08/17/23 2035 08/18/23 0137  WBC 9.4 9.9  NEUTROABS 5.1 5.6  HGB 9.4* 8.7*  HCT 31.2* 29.3*  MCV 92.9 93.3  PLT 419* 434*    Basic Metabolic Panel: Recent Labs  Lab 08/17/23 2035  NA 137  K 4.1  CL 101  CO2 19*  GLUCOSE 61*  BUN 52*  CREATININE 4.14*  CALCIUM 7.0*   GFR: Estimated Creatinine Clearance: 12.2 mL/min (A) (by C-G formula based on SCr of 4.14 mg/dL (H)). Recent Labs  Lab 08/17/23 2035 08/18/23 0026 08/18/23 0137  WBC 9.4  --  9.9  LATICACIDVEN  --  2.4*  --     Liver Function Tests: Recent Labs  Lab 08/17/23 2035  AST 56*  ALT 18  ALKPHOS 140*  BILITOT 1.5*  PROT 6.7  ALBUMIN 2.4*   No results for input(s): "LIPASE", "AMYLASE" in the last 168 hours. No results for input(s): "AMMONIA" in the last 168 hours.  ABG    Component Value Date/Time   PHART 7.345 (L) 01/09/2021 1341   PCO2ART 44.2 01/09/2021 1341   PO2ART 78 (L) 01/09/2021 1341   HCO3 31.9 (H) 11/17/2022 0709   TCO2 33 (H) 11/17/2022 0709   ACIDBASEDEF 2.0 01/09/2021 1341   O2SAT 99 11/17/2022 0709     Coagulation Profile: No results for input(s): "INR", "PROTIME" in the last 168 hours.  Cardiac Enzymes: No results for input(s): "CKTOTAL", "CKMB", "CKMBINDEX", "TROPONINI" in the last 168 hours.  HbA1C: Hgb A1c MFr Bld  Date/Time Value Ref Range Status  09/21/2022 10:24 AM 5.5 4.8 - 5.6 % Final    Comment:    (NOTE)         Prediabetes: 5.7 - 6.4         Diabetes: >6.4         Glycemic control for adults with diabetes: <7.0   07/13/2015 02:44 AM 5.3 4.8 - 5.6 % Final    Comment:    (NOTE)         Pre-diabetes: 5.7 - 6.4         Diabetes: >6.4         Glycemic control for adults with diabetes: <7.0     CBG: No results for input(s): "GLUCAP" in the last 168 hours.  Review of Systems:   10 point review of systems completed and negative except what is stated  in the HPI.  Past Medical History:  She,  has a past medical history of Arthritis, Asthma, Breast cancer (HCC) (2001), CHF (congestive heart failure) (HCC), COPD (chronic obstructive pulmonary disease) (HCC), Coronary artery disease, GERD (gastroesophageal reflux disease), History of mitral valve replacement with mechanical valve, HLD (hyperlipidemia), Hypertension, and Pulmonary embolism (HCC) (2021).   Surgical History:   Past Surgical History:  Procedure Laterality Date   APPLICATION OF A-CELL OF CHEST/ABDOMEN Left 01/27/2019   Procedure: APPLICATION OF A-CELL OF CHEST;  Surgeon: Peggye Form, DO;  Location: [facility] OR;  Service: Plastics;  Laterality: Left;   APPLICATION OF WOUND VAC N/A 01/27/2019   Procedure: APPLICATION OF WOUND VAC;  Surgeon: Peggye Form, DO;  Location: [facility] OR;  Service: Plastics;  Laterality: N/A;   BREAST SURGERY Left 2001   for CA   CARDIOVERSION N/A 02/26/2022   Procedure: CARDIOVERSION;  Surgeon: Little Ishikawa, MD;  Location: [facility] ENDOSCOPY;  Service: Cardiovascular;  Laterality: N/A;   COLONOSCOPY WITH PROPOFOL N/A 06/07/2018   Procedure: COLONOSCOPY WITH PROPOFOL;  Surgeon: Charlott Rakes, MD;  Location: [facility] ENDOSCOPY;  Service: Endoscopy;  Laterality: N/A;   DEBRIDEMENT AND CLOSURE WOUND Left 12/28/2018   Procedure: Debridement And Closure Wound;  Surgeon: Abigail Miyamoto, MD;  Location: [facility] OR;  Service: General;  Laterality: Left;   INCISION AND DRAINAGE OF WOUND Left 01/27/2019   Procedure: IRRIGATION AND DEBRIDEMENT OF CHEST WALL;  Surgeon: Peggye Form, DO;  Location: [facility] OR;  Service: Plastics;  Laterality: Left;   INTRAMEDULLARY (IM) NAIL INTERTROCHANTERIC N/A 09/23/2022   Procedure: INTRAMEDULLARY (IM) NAIL INTERTROCHANTERIC;  Surgeon: Tarry Kos, MD;  Location: [facility] OR;  Service: Orthopedics;  Laterality: N/A;   KNEE ARTHROSCOPY WITH MEDIAL MENISECTOMY Left 08/06/2021   Procedure: KNEE ARTHROSCOPY WITH PARTIAL MEDIAL  MENISECTOMY, DEBRIDEMENT;  Surgeon: Jene Every, MD;  Location: [facility] ORS;  Service: Orthopedics;  Laterality: Left;  1 HR   LATISSIMUS FLAP TO BREAST Left 01/18/2019   Procedure: LEFT LATISSIMUS FLAP TO BREAST;  Surgeon: Peggye Form, DO;  Location: [facility] OR;  Service: Plastics;  Laterality: Left;   MASTECTOMY Left    MITRAL VALVE REPLACEMENT     mechanical   POLYPECTOMY  06/07/2018   Procedure: POLYPECTOMY;  Surgeon: Charlott Rakes, MD;  Location: [facility] ENDOSCOPY;  Service: Endoscopy;;   RIGHT/LEFT HEART CATH AND CORONARY ANGIOGRAPHY N/A 01/09/2021   Procedure: RIGHT/LEFT HEART CATH AND CORONARY ANGIOGRAPHY;  Surgeon: Dolores Patty, MD;  Location: [facility] INVASIVE CV LAB;  Service: Cardiovascular;  Laterality: N/A;   WOUND DEBRIDEMENT Left 12/28/2018   Procedure: EXCISION OF LEFT CHRONIC CHEST WALL WOUND;  Surgeon: Abigail Miyamoto, MD;  Location: [facility] OR;  Service: General;  Laterality: Left;     Social History:   reports that she has been smoking cigarettes. She has a 3 pack-year smoking history. She has never used smokeless tobacco. She reports current alcohol use of about 3.0 standard drinks of alcohol per week. She reports that she does not use drugs.   Family History:  Her family history includes Breast cancer (age of onset: 42) in her maternal aunt; Cancer in her father; Heart attack in an other family member; Hypertension in her father and mother.   Allergies Allergies  Allergen Reactions   Lisinopril Swelling   Acetaminophen-Codeine Itching   Propoxyphene Itching    Darvocet   Tape Rash    Plastic   Tramadol Itching, Nausea And Vomiting and Rash     Home Medications  Prior to Admission medications   Medication Sig Start Date End Date Taking? Authorizing Provider  albuterol (VENTOLIN HFA) 108 (90 Base) MCG/ACT inhaler INHALE 2 PUFFS BY MOUTH EVERY 6 HOURS AS NEEDED FOR WHEEZING FOR SHORTNESS OF BREATH 07/15/23  Yes Kathleen Lime, MD  FARXIGA 10 MG TABS tablet Take 1  tablet by mouth once daily 06/03/23  Yes Kathleen Lime, MD  fluticasone (FLONASE) 50 MCG/ACT nasal spray Place 2 sprays into both nostrils daily. Patient taking differently: Place 1 spray into both nostrils daily. 05/17/23 05/16/24 Yes Kathleen Lime,  MD  gabapentin (NEURONTIN) 800 MG tablet Take 1 tablet (800 mg total) by mouth 3 (three) times daily. 03/01/23  Yes Lovorn, Aundra Millet, MD  HYDROcodone-acetaminophen (NORCO/VICODIN) 5-325 MG tablet Take 1 tablet by mouth every 4 (four) hours as needed for moderate pain (pain score 4-6). 08/05/23  Yes Lovorn, Aundra Millet, MD  losartan (COZAAR) 25 MG tablet Take 0.5 tablets (12.5 mg total) by mouth daily. 08/10/23 11/08/23 Yes Milford, Anderson Malta, FNP  metoprolol succinate (TOPROL-XL) 25 MG 24 hr tablet Take 1 tablet (25 mg total) by mouth 2 (two) times daily. 08/01/23  Yes Monna Fam, MD  potassium chloride (KLOR-CON M) 10 MEQ tablet Take 6 tablets (60 mEq total) by mouth daily. Patient taking differently: Take 60 mEq by mouth 2 (two) times daily. Take 3 tablets in the morning and 3 tablets in the afternoon 08/10/23 03/07/24 Yes Milford, Anderson Malta, FNP  simvastatin (ZOCOR) 20 MG tablet Take 1 tablet (20 mg total) by mouth daily. 12/03/22  Yes Willette Cluster, MD  tetrahydrozoline 0.05 % ophthalmic solution Place 1 drop into both eyes 3 (three) times daily.   Yes [provider]  torsemide (DEMADEX) 100 MG tablet Take 1 tablet (100 mg total) by mouth daily. 08/10/23  Yes Milford, Flora, FNP  TRELEGY ELLIPTA 100-62.5-25 MCG/ACT AEPB INHALE 1 PUFF ONCE DAILY INTO  THE  LUNGS 07/15/23  Yes Kathleen Lime, MD  warfarin (COUMADIN) 5 MG tablet Take one-and-one-half (1 & 1/2) of your 5 mg peach colored warfarin tablets on Mondays and Thursdays. All other days, take only one (1) tablet. Patient taking differently: Take 5 mg by mouth daily. 04/26/23  Yes Elicia Lamp, RPH-CPP     Critical care time:   CRITICAL CARE Performed by: Madaline Brilliant DO PulmCCM   Total  critical care time: 45  minutes  Critical care time was exclusive of separately billable procedures and treating other patients.  Critical care was necessary to treat or prevent imminent or life-threatening deterioration.  Critical care was time spent personally by me on the following activities: development of treatment plan with patient and/or surrogate as well as nursing, discussions with consultants, evaluation of patient's response to treatment, examination of patient, obtaining history from patient or surrogate, ordering and performing treatments and interventions, ordering and review of laboratory studies, ordering and review of radiographic studies, pulse oximetry and re-evaluation of patient's condition.

## 2023-08-18 NOTE — Progress Notes (Signed)
 PHARMACY - ANTICOAGULATION CONSULT NOTE  Pharmacy Consult for heparin Indication:  Mechanical mitral valve replacement  Allergies  Allergen Reactions   Lisinopril Swelling   Acetaminophen-Codeine Itching   Propoxyphene Itching    Darvocet   Tape Rash    Plastic   Tramadol Itching, Nausea And Vomiting and Rash    Patient Measurements: Height: 5\' 5"  (165.1 cm) Weight: 51.2 kg (112 lb 14 oz) IBW/kg (Calculated) : 57 Heparin Dosing Weight: TBW  Vital Signs: Temp: 98.3 F (36.8 C) (02/26 1953) Temp Source: Oral (02/26 1953) BP: 133/107 (02/26 2111) Pulse Rate: 82 (02/26 2111)  Labs: Recent Labs    08/17/23 2035 08/18/23 0022 08/18/23 0137 08/18/23 0234 08/18/23 0758 08/18/23 1624 08/18/23 2132  HGB 9.4*  --  8.7* 8.4* 9.0* 8.4* 8.1*  HCT 31.2*  --  29.3* 27.6* 29.4* 26.9* 25.9*  PLT 419*  --  434* 409*  --  430* 406*  APTT  --   --  170*  --   --   --  66*  LABPROT  --   --  >90.0*  --  19.1*  --   --   INR  --   --  >10.0*  --  1.6*  --   --   HEPARINUNFRC  --   --   --   --   --   --  <0.10*  CREATININE 4.14*  --  4.34* 4.27*  --   --   --   TROPONINIHS 113* 121*  --   --   --  198*  --     Estimated Creatinine Clearance: 9.5 mL/min (A) (by C-G formula based on SCr of 4.27 mg/dL (H)).   Medical History: Past Medical History:  Diagnosis Date   Arthritis    Asthma    Breast cancer (HCC) 2001   Left Breast Cancer   CHF (congestive heart failure) (HCC)    COPD (chronic obstructive pulmonary disease) (HCC)    Coronary artery disease    GERD (gastroesophageal reflux disease)    History of mitral valve replacement with mechanical valve    HLD (hyperlipidemia)    Hypertension    Pulmonary embolism (HCC) 2021    Medications:  Medications Prior to Admission  Medication Sig Dispense Refill Last Dose/Taking   albuterol (VENTOLIN HFA) 108 (90 Base) MCG/ACT inhaler INHALE 2 PUFFS BY MOUTH EVERY 6 HOURS AS NEEDED FOR WHEEZING FOR SHORTNESS OF BREATH 9 g 0  08/17/2023 Noon   FARXIGA 10 MG TABS tablet Take 1 tablet by mouth once daily 90 tablet 0 08/17/2023 Morning   fluticasone (FLONASE) 50 MCG/ACT nasal spray Place 2 sprays into both nostrils daily. (Patient taking differently: Place 1 spray into both nostrils daily.) 11.1 mL 3 08/17/2023 Morning   gabapentin (NEURONTIN) 800 MG tablet Take 1 tablet (800 mg total) by mouth 3 (three) times daily. 90 tablet 5 08/17/2023   HYDROcodone-acetaminophen (NORCO/VICODIN) 5-325 MG tablet Take 1 tablet by mouth every 4 (four) hours as needed for moderate pain (pain score 4-6). 150 tablet 0 08/17/2023 Morning   losartan (COZAAR) 25 MG tablet Take 0.5 tablets (12.5 mg total) by mouth daily. 45 tablet 3 08/17/2023 Morning   metoprolol succinate (TOPROL-XL) 25 MG 24 hr tablet Take 1 tablet (25 mg total) by mouth 2 (two) times daily. 60 tablet 2 08/17/2023 Morning   potassium chloride (KLOR-CON M) 10 MEQ tablet Take 6 tablets (60 mEq total) by mouth daily. (Patient taking differently: Take 60 mEq by mouth 2 (two)  times daily. Take 3 tablets in the morning and 3 tablets in the afternoon) 120 tablet 6 08/17/2023 Noon   simvastatin (ZOCOR) 20 MG tablet Take 1 tablet (20 mg total) by mouth daily. 90 tablet 2 08/17/2023 Morning   tetrahydrozoline 0.05 % ophthalmic solution Place 1 drop into both eyes 3 (three) times daily.   08/17/2023   torsemide (DEMADEX) 100 MG tablet Take 1 tablet (100 mg total) by mouth daily. 90 tablet 3 08/17/2023 Morning   TRELEGY ELLIPTA 100-62.5-25 MCG/ACT AEPB INHALE 1 PUFF ONCE DAILY INTO  THE  LUNGS 60 each 0 08/17/2023 Morning   warfarin (COUMADIN) 5 MG tablet Take one-and-one-half (1 & 1/2) of your 5 mg peach colored warfarin tablets on Mondays and Thursdays. All other days, take only one (1) tablet. (Patient taking differently: Take 5 mg by mouth daily.) 32 tablet 2 08/15/2023   Scheduled:   Chlorhexidine Gluconate Cloth  6 each Topical Daily   fluticasone furoate-vilanterol  1 puff Inhalation Daily    And   umeclidinium bromide  1 puff Inhalation Daily   insulin aspart  0-15 Units Subcutaneous Q4H   pantoprazole (PROTONIX) IV  40 mg Intravenous Q12H   sodium chloride flush  10-40 mL Intracatheter Q12H    Assessment: Kim Anthony is a 73 y.o.female with significant past medical history of  HFrEF [LVEF 35-40% 05/11/2022], mechanical mitral valve placement [2001] on chronic warfarin therapy [INR goal 2.5-3.5], paroxymal A-fib, CAD, HTN, HLD, hx breast cancer s/p left mastectomy and radiation/chemo, who presented to ED due to bloody stools and dizziness.   Pharmacy has been consulted to cautiously initiate heparin in setting of mechanical mitral valve replacement. Reportedly still having melenic stools. Discussed with CCM provider, Dr. Celine Mans and Dr. Sherrilee Gilles. Will proceed with IV heparin without bolus, and cautiously titrate to therapeutic level. Patient is hemodynamically stable, status post 2 u PRBC, with Hgb roughly stable at 9.4. Patient also with AKI, CrCl of 9.5 mL/min today.  HL <0.10 - subtherapeutic aPTT 66s - therapeutic    Goal of Therapy:  Heparin level 0.3-0.7 units/ml aPTT 66-102 seconds Monitor platelets by anticoagulation protocol: Yes   Plan:  Increase heparin infusion to 900 units/hr to obtain therapeutic anti-Xa level -heparin infusion to stop at 2/27 @0600  in preparation for endoscopy No further timed levels d/t heparin to stop in AM  Monitor closely for signs of worsening bleeding or clot  Calton Dach, PharmD, BCCCP Clinical Pharmacist 08/18/2023 10:14 PM

## 2023-08-19 ENCOUNTER — Other Ambulatory Visit: Payer: Self-pay | Admitting: Student

## 2023-08-19 ENCOUNTER — Inpatient Hospital Stay (HOSPITAL_COMMUNITY): Payer: 59

## 2023-08-19 ENCOUNTER — Encounter (HOSPITAL_COMMUNITY): Payer: 59

## 2023-08-19 DIAGNOSIS — I5021 Acute systolic (congestive) heart failure: Secondary | ICD-10-CM

## 2023-08-19 DIAGNOSIS — D62 Acute posthemorrhagic anemia: Secondary | ICD-10-CM | POA: Diagnosis not present

## 2023-08-19 DIAGNOSIS — K922 Gastrointestinal hemorrhage, unspecified: Secondary | ICD-10-CM | POA: Diagnosis not present

## 2023-08-19 DIAGNOSIS — Z01810 Encounter for preprocedural cardiovascular examination: Secondary | ICD-10-CM | POA: Diagnosis not present

## 2023-08-19 DIAGNOSIS — I480 Paroxysmal atrial fibrillation: Secondary | ICD-10-CM | POA: Diagnosis not present

## 2023-08-19 DIAGNOSIS — Z952 Presence of prosthetic heart valve: Secondary | ICD-10-CM

## 2023-08-19 DIAGNOSIS — I50810 Right heart failure, unspecified: Secondary | ICD-10-CM

## 2023-08-19 DIAGNOSIS — N179 Acute kidney failure, unspecified: Secondary | ICD-10-CM | POA: Diagnosis not present

## 2023-08-19 DIAGNOSIS — I4891 Unspecified atrial fibrillation: Secondary | ICD-10-CM

## 2023-08-19 LAB — TYPE AND SCREEN
ABO/RH(D): O NEG
Antibody Screen: NEGATIVE
Unit division: 0

## 2023-08-19 LAB — CBC
HCT: 23.3 % — ABNORMAL LOW (ref 36.0–46.0)
HCT: 25.7 % — ABNORMAL LOW (ref 36.0–46.0)
HCT: 25.8 % — ABNORMAL LOW (ref 36.0–46.0)
Hemoglobin: 7.3 g/dL — ABNORMAL LOW (ref 12.0–15.0)
Hemoglobin: 8.1 g/dL — ABNORMAL LOW (ref 12.0–15.0)
Hemoglobin: 8.2 g/dL — ABNORMAL LOW (ref 12.0–15.0)
MCH: 28.5 pg (ref 26.0–34.0)
MCH: 28.6 pg (ref 26.0–34.0)
MCH: 28.8 pg (ref 26.0–34.0)
MCHC: 31.3 g/dL (ref 30.0–36.0)
MCHC: 31.5 g/dL (ref 30.0–36.0)
MCHC: 31.8 g/dL (ref 30.0–36.0)
MCV: 90.5 fL (ref 80.0–100.0)
MCV: 90.8 fL (ref 80.0–100.0)
MCV: 91 fL (ref 80.0–100.0)
Platelets: 379 10*3/uL (ref 150–400)
Platelets: 432 10*3/uL — ABNORMAL HIGH (ref 150–400)
Platelets: 443 10*3/uL — ABNORMAL HIGH (ref 150–400)
RBC: 2.56 MIL/uL — ABNORMAL LOW (ref 3.87–5.11)
RBC: 2.83 MIL/uL — ABNORMAL LOW (ref 3.87–5.11)
RBC: 2.85 MIL/uL — ABNORMAL LOW (ref 3.87–5.11)
RDW: 16.1 % — ABNORMAL HIGH (ref 11.5–15.5)
RDW: 16.2 % — ABNORMAL HIGH (ref 11.5–15.5)
RDW: 16.4 % — ABNORMAL HIGH (ref 11.5–15.5)
WBC: 10.8 10*3/uL — ABNORMAL HIGH (ref 4.0–10.5)
WBC: 11.8 10*3/uL — ABNORMAL HIGH (ref 4.0–10.5)
WBC: 11.9 10*3/uL — ABNORMAL HIGH (ref 4.0–10.5)
nRBC: 0.7 % — ABNORMAL HIGH (ref 0.0–0.2)
nRBC: 0.8 % — ABNORMAL HIGH (ref 0.0–0.2)
nRBC: 1.2 % — ABNORMAL HIGH (ref 0.0–0.2)

## 2023-08-19 LAB — BILIRUBIN, FRACTIONATED(TOT/DIR/INDIR)
Bilirubin, Direct: 0.8 mg/dL — ABNORMAL HIGH (ref 0.0–0.2)
Indirect Bilirubin: 0.8 mg/dL (ref 0.3–0.9)
Total Bilirubin: 1.6 mg/dL — ABNORMAL HIGH (ref 0.0–1.2)

## 2023-08-19 LAB — GLUCOSE, CAPILLARY
Glucose-Capillary: 108 mg/dL — ABNORMAL HIGH (ref 70–99)
Glucose-Capillary: 152 mg/dL — ABNORMAL HIGH (ref 70–99)
Glucose-Capillary: 71 mg/dL (ref 70–99)
Glucose-Capillary: 82 mg/dL (ref 70–99)
Glucose-Capillary: 86 mg/dL (ref 70–99)
Glucose-Capillary: 88 mg/dL (ref 70–99)

## 2023-08-19 LAB — HEPARIN LEVEL (UNFRACTIONATED)
Heparin Unfractionated: <0.1 [IU]/mL — ABNORMAL LOW (ref 0.30–0.70)
Heparin Unfractionated: 0.52 [IU]/mL (ref 0.30–0.70)

## 2023-08-19 LAB — COMPREHENSIVE METABOLIC PANEL
ALT: 18 U/L (ref 0–44)
AST: 47 U/L — ABNORMAL HIGH (ref 15–41)
Albumin: 2.1 g/dL — ABNORMAL LOW (ref 3.5–5.0)
Alkaline Phosphatase: 120 U/L (ref 38–126)
Anion gap: 18 — ABNORMAL HIGH (ref 5–15)
BUN: 59 mg/dL — ABNORMAL HIGH (ref 8–23)
CO2: 19 mmol/L — ABNORMAL LOW (ref 22–32)
Calcium: 7.5 mg/dL — ABNORMAL LOW (ref 8.9–10.3)
Chloride: 102 mmol/L (ref 98–111)
Creatinine, Ser: 3.12 mg/dL — ABNORMAL HIGH (ref 0.44–1.00)
GFR, Estimated: 15 mL/min — ABNORMAL LOW (ref >60)
Glucose, Bld: 92 mg/dL (ref 70–99)
Potassium: 3.3 mmol/L — ABNORMAL LOW (ref 3.5–5.1)
Sodium: 139 mmol/L (ref 135–145)
Total Bilirubin: 2.1 mg/dL — ABNORMAL HIGH (ref 0.0–1.2)
Total Protein: 6 g/dL — ABNORMAL LOW (ref 6.5–8.1)

## 2023-08-19 LAB — PROTIME-INR
INR: 1.4 — ABNORMAL HIGH (ref 0.8–1.2)
Prothrombin Time: 17.3 s — ABNORMAL HIGH (ref 11.4–15.2)

## 2023-08-19 LAB — BASIC METABOLIC PANEL
Anion gap: 12 (ref 5–15)
BUN: 57 mg/dL — ABNORMAL HIGH (ref 8–23)
CO2: 21 mmol/L — ABNORMAL LOW (ref 22–32)
Calcium: 7.3 mg/dL — ABNORMAL LOW (ref 8.9–10.3)
Chloride: 106 mmol/L (ref 98–111)
Creatinine, Ser: 2.81 mg/dL — ABNORMAL HIGH (ref 0.44–1.00)
GFR, Estimated: 17 mL/min — ABNORMAL LOW (ref >60)
Glucose, Bld: 98 mg/dL (ref 70–99)
Potassium: 3.3 mmol/L — ABNORMAL LOW (ref 3.5–5.1)
Sodium: 139 mmol/L (ref 135–145)

## 2023-08-19 LAB — BPAM RBC
Blood Product Expiration Date: 202503062359
ISSUE DATE / TIME: 202502260412
Unit Type and Rh: 9500

## 2023-08-19 LAB — COOXEMETRY PANEL
Carboxyhemoglobin: 3.4 % — ABNORMAL HIGH (ref 0.5–1.5)
Carboxyhemoglobin: 2.6 % — ABNORMAL HIGH (ref 0.5–1.5)
Methemoglobin: 3.9 % — ABNORMAL HIGH (ref 0.0–1.5)
Methemoglobin: <0.7 % (ref 0.0–1.5)
O2 Saturation: 59.2 %
O2 Saturation: 48.7 %
Total hemoglobin: 8.9 g/dL — ABNORMAL LOW (ref 12.0–16.0)
Total hemoglobin: 8.2 g/dL — ABNORMAL LOW (ref 12.0–16.0)

## 2023-08-19 LAB — MAGNESIUM: Magnesium: 2.1 mg/dL (ref 1.7–2.4)

## 2023-08-19 LAB — TROPONIN I (HIGH SENSITIVITY): Troponin I (High Sensitivity): 121 ng/L (ref <18)

## 2023-08-19 MED ORDER — POTASSIUM CHLORIDE CRYS ER 20 MEQ PO TBCR
40.0000 meq | EXTENDED_RELEASE_TABLET | ORAL | Status: DC
  Filled 2023-08-19: qty 2

## 2023-08-19 MED ORDER — FUROSEMIDE 10 MG/ML IJ SOLN
80.0000 mg | Freq: Two times a day (BID) | INTRAMUSCULAR | Status: DC
  Administered 2023-08-19: 80 mg via INTRAVENOUS
  Filled 2023-08-19: qty 8

## 2023-08-19 MED ORDER — DEXTROSE 20 % IV SOLN
INTRAVENOUS | Status: AC
Start: 2023-08-19 — End: 2023-08-20
  Filled 2023-08-19: qty 500

## 2023-08-19 MED ORDER — DEXMEDETOMIDINE HCL IN NACL 400 MCG/100ML IV SOLN: 0.0000 ug/kg/h | INTRAVENOUS | Status: DC

## 2023-08-19 MED ORDER — FUROSEMIDE 10 MG/ML IJ SOLN: 80.0000 mg | Freq: Two times a day (BID) | INTRAMUSCULAR | Status: DC

## 2023-08-19 MED ORDER — DEXTROSE 20 % IV SOLN
INTRAVENOUS | Status: DC
  Filled 2023-08-19 (×2): qty 500

## 2023-08-19 MED ORDER — DEXTROSE 50 % IV SOLN
INTRAVENOUS | Status: DC
  Filled 2023-08-19: qty 750

## 2023-08-19 MED ORDER — AMIODARONE HCL IN DEXTROSE 360-4.14 MG/200ML-% IV SOLN
30.0000 mg/h | INTRAVENOUS | Status: DC
  Administered 2023-08-20 – 2023-09-03 (×27): 30 mg/h via INTRAVENOUS
  Filled 2023-08-19 (×28): qty 200

## 2023-08-19 MED ORDER — HEPARIN (PORCINE) 25000 UT/250ML-% IV SOLN
900.0000 [IU]/h | INTRAVENOUS | Status: DC
  Administered 2023-08-19: 900 [IU]/h via INTRAVENOUS
  Filled 2023-08-19: qty 250

## 2023-08-19 MED ORDER — AMIODARONE IV BOLUS ONLY 150 MG/100ML
150.0000 mg | Freq: Once | INTRAVENOUS | Status: AC
  Administered 2023-08-19: 150 mg via INTRAVENOUS
  Filled 2023-08-19: qty 100

## 2023-08-19 MED ORDER — POTASSIUM CHLORIDE 10 MEQ/50ML IV SOLN
10.0000 meq | INTRAVENOUS | Status: AC
  Administered 2023-08-19 (×2): 10 meq via INTRAVENOUS
  Filled 2023-08-19 (×2): qty 50

## 2023-08-19 MED ORDER — AMIODARONE HCL IN DEXTROSE 360-4.14 MG/200ML-% IV SOLN
60.0000 mg/h | INTRAVENOUS | Status: AC
  Administered 2023-08-19 – 2023-08-20 (×2): 60 mg/h via INTRAVENOUS
  Filled 2023-08-19: qty 200

## 2023-08-19 MED ORDER — POTASSIUM CHLORIDE CRYS ER 20 MEQ PO TBCR: 40.0000 meq | EXTENDED_RELEASE_TABLET | Freq: Two times a day (BID) | ORAL | Status: DC

## 2023-08-19 MED ORDER — POTASSIUM CHLORIDE 20 MEQ PO PACK
40.0000 meq | PACK | ORAL | Status: AC
  Administered 2023-08-19 (×2): 40 meq via ORAL
  Filled 2023-08-19 (×2): qty 2

## 2023-08-19 NOTE — Plan of Care (Signed)
 Contacted by primary team.  Patient with drop oxygen saturations.  Also poor biventricular function.  No further bleeding.  Hgb stable.  We will cancel today's procedure; resume heparin gtt; cardiology/heart failure consult.  Clear liquids, NPO after midnight.  Consider EGD once cleared by cardiology and cardiac status optimized.  [facility] will revisit tomorrow.

## 2023-08-19 NOTE — TOC Initial Note (Signed)
 Transition of Care [facility]) - Initial/Assessment Note    Patient Details  Name: Kim Anthony [name]: [id number] Date of Birth: 05-24-1951  Transition of Care [facility]) CM/SW Contact:    Nicanor Bake Phone Number: [phone] 08/19/2023, 3:45 PM  Clinical Narrative: HF CSW met with pt at bedside. Pt stated that she lives with her adult daughter. Pt stated that she has a history of HH services from 2024, but could not remember the name of the agency. Pt stated that she uses a walker. Pt stated that she has a scale at home. Pt stated that she has a PCP. CSW explained that a hospital follow up appt. Would be scheduled closer towards dc. Pt agrees.   Pt stated that she has a history with [facility], and that she does not want to go back there if recommended. Pt stated that she is not interested in SNF and would like to go home with [facility] services if appropriate.   TOC will continue following.                   Barriers to Discharge: Continued Medical Work up   Patient Goals and CMS Choice            Expected Discharge Plan and Services       Living arrangements for the past 2 months: Single Family Home                                      Prior Living Arrangements/Services Living arrangements for the past 2 months: Single Family Home Lives with:: Adult Children Patient language and need for interpreter reviewed:: Yes        Need for Family Participation in Patient Care: Yes (Comment) Care giver support system in place?: Yes (comment)   Criminal Activity/Legal Involvement Pertinent to Current Situation/Hospitalization: No - Comment as needed  Activities of Daily Living   ADL Screening (condition at time of admission) Independently performs ADLs?: Yes (appropriate for developmental age) Is the patient deaf or have difficulty hearing?: No Does the patient have difficulty seeing, even when wearing glasses/contacts?: No Does the patient have difficulty concentrating,  remembering, or making decisions?: No  Permission Sought/Granted Permission sought to share information with : Family Supports Permission granted to share information with : No (Contact information on chart)  Share Information with NAME: Carrington Mullenax     Permission granted to share info w Relationship: Daughter  Permission granted to share info w Contact Information: [phone]  Emotional Assessment       Orientation: : Oriented to Self, Oriented to Place, Oriented to  Time, Oriented to Situation Alcohol / Substance Use: Not Applicable Psych Involvement: No (comment)  Admission diagnosis:  Melena [K92.1] GI bleed [K92.2] Symptomatic anemia [D64.9] Patient Active Problem List   Diagnosis Date Noted   GI bleed 08/18/2023   QT prolongation 08/18/2023   ABLA (acute blood loss anemia) 08/18/2023   Anemia 08/17/2023   History of mitral valve prosthesis 07/31/2023   Atrial flutter (HCC) 07/31/2023   Right ventricular failure (HCC) 07/31/2023   Acute hypoxic respiratory failure (HCC) 07/30/2023   Acute on chronic combined systolic and diastolic heart failure (HCC) 07/30/2023   CKD (chronic kidney disease) stage 3, GFR 30-59 ml/min (HCC) 07/30/2023   Gout 07/30/2023   Post herpetic neuralgia 07/30/2023   Atrial fibrillation with RVR (HCC) 07/29/2023   Arthritis of right wrist due  to gout 07/23/2023   Wrist arthritis 07/23/2023   Asymmetrical sensorineural hearing loss 01/27/2023   Bilateral hearing loss 12/01/2022   Hypokalemia 11/18/2022   Thrombocytosis 10/16/2022   Hyperkalemia 10/08/2022   Acute renal failure (HCC) 10/07/2022   Alteration in anticoagulation 09/25/2022   History of mitral valve replacement with mechanical valve 09/24/2022   Warfarin anticoagulation 09/24/2022   Closed fracture of right hip (HCC) 09/23/2022   Closed intertrochanteric fracture of right femur, initial encounter (HCC) 09/22/2022   H/O mitral valve replacement with mechanical valve 09/22/2022    Chronic HFrEF (heart failure with reduced ejection fraction) (HCC) 09/22/2022   Endometrial thickening on ultrasound 07/07/2022   Gastroesophageal reflux disease without esophagitis 06/29/2022   Chronic pain of both knees 06/29/2022   Vaginal bleeding 04/01/2022   A-fib (HCC) 03/13/2022   Facial edema 03/07/2022   Atypical atrial flutter (HCC)    Combined systolic and diastolic heart failure (HCC) 02/19/2022   CHF (congestive heart failure) (HCC) 02/19/2022   Combined systolic and diastolic congestive heart failure (HCC) 02/18/2022   Acute pulmonary edema (HCC)    Edema of both lower extremities 01/13/2022   Hypotension 01/13/2022   Subacute cough 11/28/2021   COPD exacerbation (HCC) 11/05/2021   Conjunctival hemorrhage of left eye 06/25/2021   Cellulitis of right knee 05/12/2021   Osteoarthritis of knees, bilateral 05/12/2021   Osteoarthritis of left knee 04/24/2021   Bunion 03/18/2021   Inflammatory polyarthropathy (HCC) 03/04/2021   Seronegative inflammatory arthritis 02/17/2021   High risk medication use 02/17/2021   Elevated transaminase level 01/27/2021   Pain in right hand 01/16/2021   Acute idiopathic gout of multiple sites 09/30/2020   Pain in joint of left knee 09/27/2020   Right wrist pain 05/29/2020   Ganglion of right wrist 05/29/2020   Chronic pain syndrome 01/17/2020   Monoarthritis of wrist 10/18/2019   Ganglion cyst 09/21/2019   Hip pain 08/23/2019   Thoracic ascending aortic aneurysm (HCC) 07/05/2019   Macrocytic anemia 02/16/2019   Breast cancer (HCC)    Acquired absence of left breast 12/20/2018   COPD (chronic obstructive pulmonary disease) (HCC) 11/25/2017   S/P mitral valve replacement    Chronic anticoagulation    Tobacco use disorder 02/25/2017   Lymphadenopathy, mediastinal 02/25/2017   Essential hypertension 02/01/2016   Allergic rhinitis 02/01/2016   Healthcare maintenance 01/30/2016   Chronic low back pain 07/22/2015   Pulmonary embolism  (HCC) 07/11/2015   Biventricular heart failure with reduced left ventricular function (HCC) 07/11/2015   Headache, common migraine 07/11/2015   HLD (hyperlipidemia) 07/11/2015   PCP:  Kathleen Lime, MD Pharmacy:   [facility] [address], [place] - [address] [place] [address] Phone: [phone] Fax: [phone]     Social Drivers of Health (SDOH) Social History: SDOH Screenings   Food Insecurity: No Food Insecurity (08/18/2023)  Housing: Low Risk  (08/18/2023)  Transportation Needs: No Transportation Needs (08/18/2023)  Utilities: Not At Risk (08/18/2023)  Alcohol Screen: Low Risk  (12/01/2022)  Depression (PHQ2-9): Low Risk  (12/01/2022)  Financial Resource Strain: Low Risk  (12/01/2022)  Physical Activity: Inactive (12/01/2022)  Social Connections: Patient Declined (08/18/2023)  Stress: Stress Concern Present (12/01/2022)  Tobacco Use: High Risk (08/18/2023)   SDOH Interventions:     Readmission Risk Interventions    02/19/2022    3:36 PM  Readmission Risk Prevention Plan  Transportation Screening Complete  PCP or Specialist Appt within 3-5 Days Complete  HRI or Home Care  Consult Complete  Social Work Consult for Recovery Care Planning/Counseling Complete  Palliative Care Screening Not Applicable  Medication Review Oceanographer) Complete

## 2023-08-19 NOTE — Plan of Care (Signed)
  Problem: Education: Goal: Ability to describe self-care measures that may prevent or decrease complications (Diabetes Survival Skills Education) will improve Outcome: Progressing Goal: Individualized Educational Video(s) Outcome: Progressing   Problem: Coping: Goal: Ability to adjust to condition or change in health will improve Outcome: Progressing   Problem: Fluid Volume: Goal: Ability to maintain a balanced intake and output will improve Outcome: Progressing   Problem: Metabolic: Goal: Ability to maintain appropriate glucose levels will improve Outcome: Progressing   Problem: Skin Integrity: Goal: Risk for impaired skin integrity will decrease Outcome: Progressing   Problem: Tissue Perfusion: Goal: Adequacy of tissue perfusion will improve Outcome: Progressing   Problem: Clinical Measurements: Goal: Ability to maintain clinical measurements within normal limits will improve Outcome: Progressing Goal: Will remain free from infection Outcome: Progressing Goal: Diagnostic test results will improve Outcome: Progressing Goal: Respiratory complications will improve Outcome: Progressing Goal: Cardiovascular complication will be avoided Outcome: Progressing   Problem: Activity: Goal: Risk for activity intolerance will decrease Outcome: Progressing   Problem: Coping: Goal: Level of anxiety will decrease Outcome: Progressing   Problem: Elimination: Goal: Will not experience complications related to bowel motility Outcome: Progressing Goal: Will not experience complications related to urinary retention Outcome: Progressing   Problem: Pain Managment: Goal: General experience of comfort will improve and/or be controlled Outcome: Progressing   Problem: Safety: Goal: Ability to remain free from injury will improve Outcome: Progressing   Problem: Skin Integrity: Goal: Risk for impaired skin integrity will decrease Outcome: Progressing

## 2023-08-19 NOTE — Progress Notes (Signed)
 PHARMACY - ANTICOAGULATION CONSULT NOTE  Pharmacy Consult for heparin Indication:  Mechanical mitral valve replacement  Allergies  Allergen Reactions   Lisinopril Swelling   Acetaminophen-Codeine Itching   Propoxyphene Itching    Darvocet   Tape Rash    Plastic   Tramadol Itching, Nausea And Vomiting and Rash    Patient Measurements: Height: 5\' 5"  (165.1 cm) Weight: 51.2 kg (112 lb 14 oz) IBW/kg (Calculated) : 57 Heparin Dosing Weight: TBW  Vital Signs: Temp: 98.4 F (36.9 C) (02/27 1654) Temp Source: Oral (02/27 1654) BP: 99/51 (02/27 1400) Pulse Rate: 82 (02/27 1400)  Labs: Recent Labs    08/18/23 0022 08/18/23 0137 08/18/23 0234 08/18/23 0758 08/18/23 1624 08/18/23 2132 08/19/23 0457 08/19/23 1216 08/19/23 1300 08/19/23 1526 08/19/23 1636  HGB  --  8.7* 8.4* 9.0* 8.4* 8.1* 8.2*  --   --  8.1*  --   HCT  --  29.3* 27.6* 29.4* 26.9* 25.9* 25.8*  --   --  25.7*  --   PLT  --  434* 409*  --  430* 406* 432*  --   --  443*  --   APTT  --  170*  --   --   --  66*  --   --   --   --   --   LABPROT  --  >90.0*  --  19.1*  --   --  17.3*  --   --   --   --   INR  --  >10.0*  --  1.6*  --   --  1.4*  --   --   --   --   HEPARINUNFRC  --   --   --   --   --  <0.10* <0.10*  --   --   --  0.52  CREATININE  --  4.34* 4.27*  --   --   --  3.12* 2.81*  --   --   --   TROPONINIHS 121*  --   --   --  198*  --   --   --  121*  --   --     Estimated Creatinine Clearance: 14.4 mL/min (A) (by C-G formula based on SCr of 2.81 mg/dL (H)).   Medical History: Past Medical History:  Diagnosis Date   Arthritis    Asthma    Breast cancer (HCC) 2001   Left Breast Cancer   CHF (congestive heart failure) (HCC)    COPD (chronic obstructive pulmonary disease) (HCC)    Coronary artery disease    GERD (gastroesophageal reflux disease)    History of mitral valve replacement with mechanical valve    HLD (hyperlipidemia)    Hypertension    Pulmonary embolism (HCC) 2021     Medications:  Medications Prior to Admission  Medication Sig Dispense Refill Last Dose/Taking   albuterol (VENTOLIN HFA) 108 (90 Base) MCG/ACT inhaler INHALE 2 PUFFS BY MOUTH EVERY 6 HOURS AS NEEDED FOR WHEEZING FOR SHORTNESS OF BREATH 9 g 0 08/17/2023 Noon   FARXIGA 10 MG TABS tablet Take 1 tablet by mouth once daily 90 tablet 0 08/17/2023 Morning   fluticasone (FLONASE) 50 MCG/ACT nasal spray Place 2 sprays into both nostrils daily. (Patient taking differently: Place 1 spray into both nostrils daily.) 11.1 mL 3 08/17/2023 Morning   gabapentin (NEURONTIN) 800 MG tablet Take 1 tablet (800 mg total) by mouth 3 (three) times daily. 90 tablet 5 08/17/2023   HYDROcodone-acetaminophen (NORCO/VICODIN) 5-325  MG tablet Take 1 tablet by mouth every 4 (four) hours as needed for moderate pain (pain score 4-6). 150 tablet 0 08/17/2023 Morning   losartan (COZAAR) 25 MG tablet Take 0.5 tablets (12.5 mg total) by mouth daily. 45 tablet 3 08/17/2023 Morning   metoprolol succinate (TOPROL-XL) 25 MG 24 hr tablet Take 1 tablet (25 mg total) by mouth 2 (two) times daily. 60 tablet 2 08/17/2023 Morning   potassium chloride (KLOR-CON M) 10 MEQ tablet Take 6 tablets (60 mEq total) by mouth daily. (Patient taking differently: Take 60 mEq by mouth 2 (two) times daily. Take 3 tablets in the morning and 3 tablets in the afternoon) 120 tablet 6 08/17/2023 Noon   simvastatin (ZOCOR) 20 MG tablet Take 1 tablet (20 mg total) by mouth daily. 90 tablet 2 08/17/2023 Morning   tetrahydrozoline 0.05 % ophthalmic solution Place 1 drop into both eyes 3 (three) times daily.   08/17/2023   torsemide (DEMADEX) 100 MG tablet Take 1 tablet (100 mg total) by mouth daily. 90 tablet 3 08/17/2023 Morning   TRELEGY ELLIPTA 100-62.5-25 MCG/ACT AEPB INHALE 1 PUFF ONCE DAILY INTO  THE  LUNGS 60 each 0 08/17/2023 Morning   warfarin (COUMADIN) 5 MG tablet Take one-and-one-half (1 & 1/2) of your 5 mg peach colored warfarin tablets on Mondays and Thursdays. All  other days, take only one (1) tablet. (Patient taking differently: Take 5 mg by mouth daily.) 32 tablet 2 08/15/2023   Scheduled:   Chlorhexidine Gluconate Cloth  6 each Topical Daily   fluticasone furoate-vilanterol  1 puff Inhalation Daily   And   umeclidinium bromide  1 puff Inhalation Daily   insulin aspart  0-15 Units Subcutaneous Q4H   pantoprazole (PROTONIX) IV  40 mg Intravenous Q12H   sodium chloride flush  10-40 mL Intracatheter Q12H    Assessment: Kim Anthony is a 73 y.o.female with significant past medical history of  HFrEF [LVEF 35-40% 05/11/2022], mechanical mitral valve placement [2001] on chronic warfarin therapy [INR goal 2.5-3.5], paroxymal A-fib, CAD, HTN, HLD, hx breast cancer s/p left mastectomy and radiation/chemo, who presented to ED due to bloody stools and dizziness.   Pharmacy has been consulted to cautiously initiate heparin in setting of mechanical mitral valve replacement. Reportedly still having melenic stools. Discussed with CCM provider, Dr. Celine Mans and Dr. Sherrilee Gilles. Will proceed with IV heparin without bolus, and cautiously titrate to therapeutic level. Patient is hemodynamically stable, status post 2 u PRBC, with Hgb roughly stable at 9.4. Patient also with AKI, CrCl of 9.5 mL/min today.  Procedure delayed today. Restart UFH per CCM and Gastroenterology.  2/27 PM update: HL 0.52 No signs or symptoms of bleed or issues with the heparin infusion per nursing  Goal of Therapy:  Heparin level 0.3-0.7 units/ml Monitor platelets by anticoagulation protocol: Yes   Plan:  Continue heparin infusion to 900 units/hr 8 hour Xa level (AM labs 2/28) Follow up plans for EGD, pending reschedule Monitor closely for signs of worsening bleeding or clot   Nevin Kozuch BS, PharmD, BCPS Clinical Pharmacist 08/19/2023 5:46 PM  Contact: [phone] after 3 PM  "Be curious, not judgmental..." -Debbora Dus

## 2023-08-19 NOTE — Anesthesia Preprocedure Evaluation (Signed)
 Anesthesia Evaluation    Reviewed: Allergy & Precautions, Patient's Chart, lab work & pertinent test results, Unable to perform ROS - Chart review only  History of Anesthesia Complications Negative for: history of anesthetic complications  Airway        Dental  (+) Edentulous Upper, Edentulous Lower   Pulmonary asthma , COPD,  COPD inhaler, Current Smoker and Patient abstained from smoking., PE          Cardiovascular hypertension, Pt. on medications and Pt. on home beta blockers (-) angina + CAD (single vessel RCA disease) and +CHF  + dysrhythmias Atrial Fibrillation + Valvular Problems/Murmurs (h/o mechanical MVR, severe TR)   Echo (Limited) 08/17/2023  1. Left ventricular ejection fraction, by estimation, is 55%. The left ventricle has no regional wall motion abnormalities. Left ventricular diastolic parameters are indeterminate.   2. D-shaped interventricular septum suggestive of RV pressure/volume overload. Right ventricular systolic function is mildly reduced. The right ventricular size is severely enlarged.   3. Peak RV-RA gradient 25 mmHg. The IVC was not visualized.   4. Left atrial size was mildly dilated.   5. Right atrial size was severely dilated.   6. St Jude mechanical mitral valve. Mean gradient 4 mmHg, not significantly elevated. No significant mitral regurgitation noted.   7. The tricuspid valve leaflets do not fully coapt. The tricuspid valve is abnormal. Tricuspid valve regurgitation is severe.   8. The aortic valve is tricuspid. There is mild calcification of the aortic valve. No stenosis or regurgitation noted.   9. Limited echo.    Echo 07/30/2023  1. The LV endocardium is not seen well. Suspect EF ~50% . Left ventricular ejection fraction, by estimation, is 50%. The left ventricle has low normal function. Left ventricular endocardial border not optimally defined to evaluate regional wall motion. There is mild  concentric left ventricular hypertrophy. Left ventricular diastolic parameters are indeterminate.   2. Right ventricular systolic function is severely reduced. The right ventricular size is severely enlarged. There is normal pulmonary artery systolic pressure. The estimated right ventricular systolic pressure is 27.4 mmHg.   3. Left atrial size was severely dilated.   4. Right atrial size was severely dilated.   5. Mitral gradients not assessed well. No obvious mitral stenosis based on available images/data. The mitral valve has been repaired/replaced. Trivial mitral valve regurgitation. No evidence of mitral stenosis. Echo findings are consistent with normal structure and function of the mitral valve prosthesis.   6. Tricuspid valve regurgitation is moderate to severe.   7. The aortic valve is tricuspid. There is mild calcification of the aortic valve. Aortic valve regurgitation is not visualized. No aortic stenosis is present.   8. The inferior vena cava is dilated in size with <50% respiratory variability, suggesting right atrial pressure of 15 mmHg.     04/2022 ECHO: EF 35-40%. 1. The LV has moderately decreased function, global hypokinesis.  2. RVF is severely reduced. The right ventricular size is severely enlarged. There is moderately elevated pulmonary artery systolic pressure, 48.4 mmHg.  3. Left atrial size was moderately dilated.  4. Right atrial size was severely dilated.  5. The mitral valve has been repaired/replaced. No evidence of mitral  valve regurgitation. The mean mitral valve gradient is 3.0 mmHg. There is  a mechanical valve present in the mitral position. Procedure Date: 2001.  6. Tricuspid valve regurgitation is severe.  7. The aortic valve is grossly normal. No AI, no AS.    Cath 2022   Mid RCA  lesion is 100% stenosed.   Ost Cx to Prox Cx lesion is 30% stenosed.   Dist Cx lesion is 30% stenosed.   Dist LAD lesion is 40% stenosed.    Neuro/Psych  Headaches     GI/Hepatic Neg liver ROS,GERD  Medicated and Controlled,,  Endo/Other  negative endocrine ROS    Renal/GU Renal disease     Musculoskeletal  (+) Arthritis ,    Abdominal   Peds  Hematology  (+) Blood dyscrasia, anemia Coumadin:  INR 1.2   Anesthesia Other Findings H/o breast cancer  Reproductive/Obstetrics                             Anesthesia Physical Anesthesia Plan  ASA: 3  Anesthesia Plan: General   Post-op Pain Management: Minimal or no pain anticipated   Induction: Intravenous  PONV Risk Score and Plan: 2 and Propofol infusion, TIVA and Treatment may vary due to age or medical condition  Airway Management Planned:   Additional Equipment: None  Intra-op Plan:   Post-operative Plan:   Informed Consent:   Plan Discussed with:   Anesthesia Plan Comments:         Anesthesia Quick Evaluation

## 2023-08-19 NOTE — Plan of Care (Signed)

## 2023-08-19 NOTE — Progress Notes (Addendum)
 NAME:  Kim Anthony, [name]:  [id number], DOB:  April 07, 1951, LOS: 1 ADMISSION DATE:  08/17/2023, CONSULTATION DATE:  08/18/23 REFERRING MD:  Dr. Lily Kocher, CHIEF COMPLAINT:  GI bleed   History of Present Illness:  73 year old female patient with multiple comorbidities as below, most prominently with mechanical mitral valve on chronic Coumadin therapy, A-fib present with lower GI bleed, bloody BMs, dizziness and weakness.   Of note patient has had previous episodes of melena, had colonoscopy in 2019 showing 20 mm polyp in internal hemorrhoids. Patient admits to recent NSAID use for past few weeks BID, has chronic pain, multiple complaints, critical care asked to see this patient due to symptomatic anemia and concern for worsening GI bleed.   Case is discussed with the hospitalist over the phone, is concerned about her IV access she currently has 2 peripheral IVs, we can continue to use this for blood products and IV fluids.  RN notified bedside by myself that if patient's IV access worsens and we will place a central line bedside.  Otherwise, patient can be admitted to the ICU for closer observation.  Pertinent  Medical History   has a past medical history of Arthritis, Asthma, Breast cancer (HCC) (2001), CHF (congestive heart failure) (HCC), COPD (chronic obstructive pulmonary disease) (HCC), Coronary artery disease, GERD (gastroesophageal reflux disease), History of mitral valve replacement with mechanical valve, HLD (hyperlipidemia), Hypertension, and Pulmonary embolism (HCC) (2021).   Significant Hospital Events: Including procedures, antibiotic start and stop dates in addition to other pertinent events   2/25 admitted to IMTS for hypotension and melena likely acute GI bleed, transfer to ICU due to limited IV access and close monitoring s/p 1u PRBC 2/26 PICC placed   Interim History / Subjective:  Alert this morning. Denies chest pain or abdominal pain. No BM since yesterday morning.   Objective    Blood pressure (!) 92/56, pulse 84, temperature 98.6 F (37 C), temperature source Oral, resp. rate 20, height 5\' 5"  (1.651 m), weight 51.2 kg, SpO2 100%.        Intake/Output Summary (Last 24 hours) at 08/19/2023 0618 Last data filed at 08/19/2023 0505 Gross per 24 hour  Intake 1896.26 ml  Output 650 ml  Net 1246.26 ml   Filed Weights   08/17/23 2012 08/18/23 0338 08/18/23 0356  Weight: 74.4 kg 51.2 kg 51.2 kg    Examination:  General: alert, in no acute distress HENT: AT/[place] Lungs: normal effort on 2L [place], clear bilaterally  Cardiovascular: RRR, mechanical valve click heard  Abdomen: soft, mild diffuse tenderness, non-distended, no guarding or rebound Extremities: trace LE edema  Neuro: alert and oriented  Skin: no obvious rashes or new lesions of abdomen or left hip   Resolved Hospital Problem list     Assessment & Plan:  Hypotension  Acute blood loss anemia on chronic anemia  Acute upper GI bleed Melena Recent NSAID use Chronic warfarin use  -had melanotic stool yesterday, no further episodes charted -Admit Hgb 9.4>>8.4>>8.2 s/p 1u PRBC (BL 10-11) -2 x PIV and PICC placed -PPI IV 40 mg BID -trend CBC Q8H   -transfuse if Hgb <7  -not on any pressors  -avoid NSAIDs  -change to D20 at 71ml/hr x 1 day for intermittent hypoglycemia  -GI following, hold and plan for EGD once cardiology evaluates -CLD today, NPO @ [facility]   Chronic biventricular heart failure (EF 55%) HTN -co-ox panel was O2 59%, checking CVP and diuresis -repeat echo showed RV overload, noted in previous of RV  dysfunction  -consulted cardiology/advanced HF team for evaluation for procedure clearance, appreciate recs -strict I&O -hold home antihypertensives due to hypotension   AKI on CKD 3a AGMA Lactic acidosis -admit Scr 4.14 (BL 1-1.3), suspect in hypovolemia and recent daily NSAID use  -lactic trend down s/p IVF  -trend BMP  -getting IVF, Scr trending down -pending UA  -avoid nephrotoxic  agents and renally dose meds  Elevated troponin  Demand ischemia Prolonged QT -troponin 113>>121>>198  -trend trop to peak  -no ischemic changes seen on EKG -continues to deny chest pain, cardiology c/s for CHF  Hx of Afib  Hx of mechanical mitral valve on Warfarin  Prolonged QT -Initial EKG showed NSR  -INR on admit >10, s/p vitamin K and PCC -hold home metoprolol and warfarin -on heparin drip per pharmacy  -hold heparin drip prior to EGD  Elevated bilirubin and LFTs LFT trending down from admission. Total bili 2.1. Noted prior elevations before.  -F/u U/S abdomen complete -pending fractionated bilirubin    COPD -continue hospital formulary Trelegy inhaler   Chronic back pain Postherpetic neuralgia Chronic opiate use -hold home Norco/Vicodin 5-325mg  (Takes 6 tablets/day) given NPO -hold home gabapentin given AKI  -IV dialudid 0.5 mg Q4H PRN    Best Practice (right click and "Reselect all SmartList Selections" daily)   Diet/type: NPO DVT prophylaxis systemic heparin Pressure ulcer(s): N/A GI prophylaxis: PPI Lines: N/A Foley:  N/A Code Status:  full code Last date of multidisciplinary goals of care discussion [ updated daughter 2/27 ]  Labs   CBC: Recent Labs  Lab 08/17/23 2035 08/18/23 0137 08/18/23 0234 08/18/23 0758 08/18/23 1624 08/18/23 2132 08/19/23 0457  WBC 9.4 9.9 9.8  --  10.5 11.6* 11.9*  NEUTROABS 5.1 5.6  --   --   --   --   --   HGB 9.4* 8.7* 8.4* 9.0* 8.4* 8.1* 8.2*  HCT 31.2* 29.3* 27.6* 29.4* 26.9* 25.9* 25.8*  MCV 92.9 93.3 92.9  --  90.9 91.8 90.5  PLT 419* 434* 409*  --  430* 406* 432*    Basic Metabolic Panel: Recent Labs  Lab 08/17/23 2035 08/18/23 0137 08/18/23 0234 08/19/23 0457  NA 137 139 139 139  K 4.1 4.6 4.2 3.3*  CL 101 106 105 102  CO2 19* 19* 17* 19*  GLUCOSE 61* 66* 64* 92  BUN 52* 58* 58* 59*  CREATININE 4.14* 4.34* 4.27* 3.12*  CALCIUM 7.0* 6.6* 6.8* 7.5*  MG  --  1.5* 1.4*  --   PHOS  --   --  3.9  --     GFR: Estimated Creatinine Clearance: 13 mL/min (A) (by C-G formula based on SCr of 3.12 mg/dL (H)). Recent Labs  Lab 08/18/23 0026 08/18/23 0137 08/18/23 0234 08/18/23 0251 08/18/23 1624 08/18/23 2132 08/19/23 0457  WBC  --    < > 9.8  --  10.5 11.6* 11.9*  LATICACIDVEN 2.4*  --   --  2.0*  --   --   --    < > = values in this interval not displayed.    Liver Function Tests: Recent Labs  Lab 08/17/23 2035 08/18/23 0137 08/19/23 0457  AST 56* 55* 47*  ALT 18 17 18   ALKPHOS 140* 132* 120  BILITOT 1.5* 1.3* 2.1*  PROT 6.7 6.0* 6.0*  ALBUMIN 2.4* 2.2* 2.1*   Recent Labs  Lab 08/18/23 0212  LIPASE 19  AMYLASE 73   No results for input(s): "AMMONIA" in the last 168 hours.  ABG  Component Value Date/Time   PHART 7.345 (L) 01/09/2021 1341   PCO2ART 44.2 01/09/2021 1341   PO2ART 78 (L) 01/09/2021 1341   HCO3 21.2 08/18/2023 0758   TCO2 33 (H) 11/17/2022 0709   ACIDBASEDEF 5.1 (H) 08/18/2023 0758   O2SAT 26.5 08/18/2023 0758     Coagulation Profile: Recent Labs  Lab 08/18/23 0137 08/18/23 0758 08/19/23 0457  INR >10.0* 1.6* 1.4*    Cardiac Enzymes: No results for input(s): "CKTOTAL", "CKMB", "CKMBINDEX", "TROPONINI" in the last 168 hours.  HbA1C: Hgb A1c MFr Bld  Date/Time Value Ref Range Status  08/18/2023 02:12 AM 6.0 (H) 4.8 - 5.6 % Final    Comment:    (NOTE) Pre diabetes:          5.7%-6.4%  Diabetes:              >6.4%  Glycemic control for   <7.0% adults with diabetes   09/21/2022 10:24 AM 5.5 4.8 - 5.6 % Final    Comment:    (NOTE)         Prediabetes: 5.7 - 6.4         Diabetes: >6.4         Glycemic control for adults with diabetes: <7.0     CBG: Recent Labs  Lab 08/18/23 1107 08/18/23 1628 08/18/23 1952 08/19/23 0019 08/19/23 0408  GLUCAP 88 113* 110* 71 82    Review of Systems:   Endorses melena, abdominal pain, chronic hip and back pain  Denies chest pain, n/v   Past Medical History:  She,  has a past medical  history of Arthritis, Asthma, Breast cancer (HCC) (2001), CHF (congestive heart failure) (HCC), COPD (chronic obstructive pulmonary disease) (HCC), Coronary artery disease, GERD (gastroesophageal reflux disease), History of mitral valve replacement with mechanical valve, HLD (hyperlipidemia), Hypertension, and Pulmonary embolism (HCC) (2021).   Surgical History:   Past Surgical History:  Procedure Laterality Date   APPLICATION OF A-CELL OF CHEST/ABDOMEN Left 01/27/2019   Procedure: APPLICATION OF A-CELL OF CHEST;  Surgeon: Peggye Form, DO;  Location: [facility] OR;  Service: Plastics;  Laterality: Left;   APPLICATION OF WOUND VAC N/A 01/27/2019   Procedure: APPLICATION OF WOUND VAC;  Surgeon: Peggye Form, DO;  Location: [facility] OR;  Service: Plastics;  Laterality: N/A;   BREAST SURGERY Left 2001   for CA   CARDIOVERSION N/A 02/26/2022   Procedure: CARDIOVERSION;  Surgeon: Little Ishikawa, MD;  [facility] ENDOSCOPY;  Service: Cardiovascular;  Laterality: N/A;   COLONOSCOPY WITH PROPOFOL N/A 06/07/2018   Procedure: COLONOSCOPY WITH PROPOFOL;  Surgeon: Charlott Rakes, MD;  Location: [facility] ENDOSCOPY;  Service: Endoscopy;  Laterality: N/A;   DEBRIDEMENT AND CLOSURE WOUND Left 12/28/2018   Procedure: Debridement And Closure Wound;  Surgeon: Abigail Miyamoto, MD;  Location: [facility] OR;  Service: General;  Laterality: Left;   INCISION AND DRAINAGE OF WOUND Left 01/27/2019   Procedure: IRRIGATION AND DEBRIDEMENT OF CHEST WALL;  Surgeon: Peggye Form, DO;  Location: [facility] OR;  Service: Plastics;  Laterality: Left;   INTRAMEDULLARY (IM) NAIL INTERTROCHANTERIC N/A 09/23/2022   Procedure: INTRAMEDULLARY (IM) NAIL INTERTROCHANTERIC;  Surgeon: Tarry Kos, MD;  Location: [facility] OR;  Service: Orthopedics;  Laterality: N/A;   KNEE ARTHROSCOPY WITH MEDIAL MENISECTOMY Left 08/06/2021   Procedure: KNEE ARTHROSCOPY WITH PARTIAL MEDIAL MENISECTOMY, DEBRIDEMENT;  Surgeon: Jene Every, MD;  Location:  [facility] ORS;  Service: Orthopedics;  Laterality: Left;  1 HR   LATISSIMUS FLAP TO BREAST Left 01/18/2019   Procedure:  LEFT LATISSIMUS FLAP TO BREAST;  Surgeon: Peggye Form, DO;  Location: [facility] OR;  Service: Plastics;  Laterality: Left;   MASTECTOMY Left    MITRAL VALVE REPLACEMENT     mechanical   POLYPECTOMY  06/07/2018   Procedure: POLYPECTOMY;  Surgeon: Charlott Rakes, MD;  Location: [facility] ENDOSCOPY;  Service: Endoscopy;;   RIGHT/LEFT HEART CATH AND CORONARY ANGIOGRAPHY N/A 01/09/2021   Procedure: RIGHT/LEFT HEART CATH AND CORONARY ANGIOGRAPHY;  Surgeon: Dolores Patty, MD;  Location: [facility] INVASIVE CV LAB;  Service: Cardiovascular;  Laterality: N/A;   WOUND DEBRIDEMENT Left 12/28/2018   Procedure: EXCISION OF LEFT CHRONIC CHEST WALL WOUND;  Surgeon: Abigail Miyamoto, MD;  Location: [facility] OR;  Service: General;  Laterality: Left;     Social History:   reports that she has been smoking cigarettes. She has a 3 pack-year smoking history. She has never used smokeless tobacco. She reports current alcohol use of about 3.0 standard drinks of alcohol per week. She reports that she does not use drugs.   Family History:  Her family history includes Breast cancer (age of onset: 2) in her maternal aunt; Cancer in her father; Heart attack in an other family member; Hypertension in her father and mother.   Allergies Allergies  Allergen Reactions   Lisinopril Swelling   Acetaminophen-Codeine Itching   Propoxyphene Itching    Darvocet   Tape Rash    Plastic   Tramadol Itching, Nausea And Vomiting and Rash     Home Medications  Prior to Admission medications   Medication Sig Start Date End Date Taking? Authorizing Provider  albuterol (VENTOLIN HFA) 108 (90 Base) MCG/ACT inhaler INHALE 2 PUFFS BY MOUTH EVERY 6 HOURS AS NEEDED FOR WHEEZING FOR SHORTNESS OF BREATH 07/15/23  Yes Kathleen Lime, MD  FARXIGA 10 MG TABS tablet Take 1 tablet by mouth once daily 06/03/23  Yes Kathleen Lime, MD   fluticasone (FLONASE) 50 MCG/ACT nasal spray Place 2 sprays into both nostrils daily. Patient taking differently: Place 1 spray into both nostrils daily. 05/17/23 05/16/24 Yes Kathleen Lime, MD  gabapentin (NEURONTIN) 800 MG tablet Take 1 tablet (800 mg total) by mouth 3 (three) times daily. 03/01/23  Yes Lovorn, Aundra Millet, MD  HYDROcodone-acetaminophen (NORCO/VICODIN) 5-325 MG tablet Take 1 tablet by mouth every 4 (four) hours as needed for moderate pain (pain score 4-6). 08/05/23  Yes Lovorn, Aundra Millet, MD  losartan (COZAAR) 25 MG tablet Take 0.5 tablets (12.5 mg total) by mouth daily. 08/10/23 11/08/23 Yes Milford, Anderson Malta, FNP  metoprolol succinate (TOPROL-XL) 25 MG 24 hr tablet Take 1 tablet (25 mg total) by mouth 2 (two) times daily. 08/01/23  Yes Monna Fam, MD  potassium chloride (KLOR-CON M) 10 MEQ tablet Take 6 tablets (60 mEq total) by mouth daily. Patient taking differently: Take 60 mEq by mouth 2 (two) times daily. Take 3 tablets in the morning and 3 tablets in the afternoon 08/10/23 03/07/24 Yes Milford, Anderson Malta, FNP  simvastatin (ZOCOR) 20 MG tablet Take 1 tablet (20 mg total) by mouth daily. 12/03/22  Yes Willette Cluster, MD  tetrahydrozoline 0.05 % ophthalmic solution Place 1 drop into both eyes 3 (three) times daily.   Yes [provider]  torsemide (DEMADEX) 100 MG tablet Take 1 tablet (100 mg total) by mouth daily. 08/10/23  Yes Milford, [place], FNP  TRELEGY ELLIPTA 100-62.5-25 MCG/ACT AEPB INHALE 1 PUFF ONCE DAILY INTO  THE  LUNGS 07/15/23  Yes Kathleen Lime, MD  warfarin (COUMADIN) 5 MG tablet Take one-and-one-half (1 &  1/2) of your 5 mg peach colored warfarin tablets on Mondays and Thursdays. All other days, take only one (1) tablet. Patient taking differently: Take 5 mg by mouth daily. 04/26/23  Yes Elicia Lamp, RPH-CPP     Critical care time:      Signature: Rana Snare, DO Internal Medicine Resident PGY-2

## 2023-08-19 NOTE — Progress Notes (Addendum)
 PHARMACY - ANTICOAGULATION CONSULT NOTE  Pharmacy Consult for heparin Indication:  Mechanical mitral valve replacement  Allergies  Allergen Reactions   Lisinopril Swelling   Acetaminophen-Codeine Itching   Propoxyphene Itching    Darvocet   Tape Rash    Plastic   Tramadol Itching, Nausea And Vomiting and Rash    Patient Measurements: Height: 5\' 5"  (165.1 cm) Weight: 51.2 kg (112 lb 14 oz) IBW/kg (Calculated) : 57 Heparin Dosing Weight: TBW  Vital Signs: Temp: 98.6 F (37 C) (02/27 0409) Temp Source: Oral (02/27 0409) BP: 110/89 (02/27 0800) Pulse Rate: 87 (02/27 0800)  Labs: Recent Labs    08/17/23 2035 08/18/23 0022 08/18/23 0137 08/18/23 0234 08/18/23 0758 08/18/23 1624 08/18/23 2132 08/19/23 0457  HGB 9.4*  --  8.7* 8.4* 9.0* 8.4* 8.1* 8.2*  HCT 31.2*  --  29.3* 27.6* 29.4* 26.9* 25.9* 25.8*  PLT 419*  --  434* 409*  --  430* 406* 432*  APTT  --   --  170*  --   --   --  66*  --   LABPROT  --   --  >90.0*  --  19.1*  --   --  17.3*  INR  --   --  >10.0*  --  1.6*  --   --  1.4*  HEPARINUNFRC  --   --   --   --   --   --  <0.10* <0.10*  CREATININE 4.14*  --  4.34* 4.27*  --   --   --  3.12*  TROPONINIHS 113* 121*  --   --   --  198*  --   --     Estimated Creatinine Clearance: 13 mL/min (A) (by C-G formula based on SCr of 3.12 mg/dL (H)).   Medical History: Past Medical History:  Diagnosis Date   Arthritis    Asthma    Breast cancer (HCC) 2001   Left Breast Cancer   CHF (congestive heart failure) (HCC)    COPD (chronic obstructive pulmonary disease) (HCC)    Coronary artery disease    GERD (gastroesophageal reflux disease)    History of mitral valve replacement with mechanical valve    HLD (hyperlipidemia)    Hypertension    Pulmonary embolism (HCC) 2021    Medications:  Medications Prior to Admission  Medication Sig Dispense Refill Last Dose/Taking   albuterol (VENTOLIN HFA) 108 (90 Base) MCG/ACT inhaler INHALE 2 PUFFS BY MOUTH EVERY 6  HOURS AS NEEDED FOR WHEEZING FOR SHORTNESS OF BREATH 9 g 0 08/17/2023 Noon   FARXIGA 10 MG TABS tablet Take 1 tablet by mouth once daily 90 tablet 0 08/17/2023 Morning   fluticasone (FLONASE) 50 MCG/ACT nasal spray Place 2 sprays into both nostrils daily. (Patient taking differently: Place 1 spray into both nostrils daily.) 11.1 mL 3 08/17/2023 Morning   gabapentin (NEURONTIN) 800 MG tablet Take 1 tablet (800 mg total) by mouth 3 (three) times daily. 90 tablet 5 08/17/2023   HYDROcodone-acetaminophen (NORCO/VICODIN) 5-325 MG tablet Take 1 tablet by mouth every 4 (four) hours as needed for moderate pain (pain score 4-6). 150 tablet 0 08/17/2023 Morning   losartan (COZAAR) 25 MG tablet Take 0.5 tablets (12.5 mg total) by mouth daily. 45 tablet 3 08/17/2023 Morning   metoprolol succinate (TOPROL-XL) 25 MG 24 hr tablet Take 1 tablet (25 mg total) by mouth 2 (two) times daily. 60 tablet 2 08/17/2023 Morning   potassium chloride (KLOR-CON M) 10 MEQ tablet Take 6 tablets (60  mEq total) by mouth daily. (Patient taking differently: Take 60 mEq by mouth 2 (two) times daily. Take 3 tablets in the morning and 3 tablets in the afternoon) 120 tablet 6 08/17/2023 Noon   simvastatin (ZOCOR) 20 MG tablet Take 1 tablet (20 mg total) by mouth daily. 90 tablet 2 08/17/2023 Morning   tetrahydrozoline 0.05 % ophthalmic solution Place 1 drop into both eyes 3 (three) times daily.   08/17/2023   torsemide (DEMADEX) 100 MG tablet Take 1 tablet (100 mg total) by mouth daily. 90 tablet 3 08/17/2023 Morning   TRELEGY ELLIPTA 100-62.5-25 MCG/ACT AEPB INHALE 1 PUFF ONCE DAILY INTO  THE  LUNGS 60 each 0 08/17/2023 Morning   warfarin (COUMADIN) 5 MG tablet Take one-and-one-half (1 & 1/2) of your 5 mg peach colored warfarin tablets on Mondays and Thursdays. All other days, take only one (1) tablet. (Patient taking differently: Take 5 mg by mouth daily.) 32 tablet 2 08/15/2023   Scheduled:   Chlorhexidine Gluconate Cloth  6 each Topical Daily    fluticasone furoate-vilanterol  1 puff Inhalation Daily   And   umeclidinium bromide  1 puff Inhalation Daily   insulin aspart  0-15 Units Subcutaneous Q4H   pantoprazole (PROTONIX) IV  40 mg Intravenous Q12H   sodium chloride flush  10-40 mL Intracatheter Q12H    Assessment: Kim Anthony is a 73 y.o.female with significant past medical history of  HFrEF [LVEF 35-40% 05/11/2022], mechanical mitral valve placement [2001] on chronic warfarin therapy [INR goal 2.5-3.5], paroxymal A-fib, CAD, HTN, HLD, hx breast cancer s/p left mastectomy and radiation/chemo, who presented to ED due to bloody stools and dizziness.   Pharmacy has been consulted to cautiously initiate heparin in setting of mechanical mitral valve replacement. Reportedly still having melenic stools. Discussed with CCM provider, Dr. Celine Mans and Dr. Sherrilee Gilles. Will proceed with IV heparin without bolus, and cautiously titrate to therapeutic level. Patient is hemodynamically stable, status post 2 u PRBC, with Hgb roughly stable at 9.4. Patient also with AKI, CrCl of 9.5 mL/min today.  Procedure delayed today. Restart UFH per CCM and Gastroenterology.   Goal of Therapy:  Heparin level 0.3-0.7 units/ml Monitor platelets by anticoagulation protocol: Yes   Plan:  Restart heparin infusion to 900 units/hr 8 hour Xa level Follow up plans for EGD, pending reschedule Monitor closely for signs of worsening bleeding or clot  Lora Paula, PharmD PGY-2 Infectious Diseases Pharmacy Resident [facility] 08/19/2023 10:33 AM

## 2023-08-19 NOTE — Consult Note (Addendum)
 Advanced Heart Failure Team Consult Note   Primary Physician: Kathleen Lime, MD Cardiologist:  Arvilla Meres, MD  Reason for Consultation: Surgical Clearance/ RV Failure  HPI:    Kim Anthony is seen today for evaluation of surgical clearance in the setting of RV failure at the request of Dr. Dulce Sellar.   Kim Anthony is a 73 y.o. female with a history of mechanical MVR in 2001 in [place], [place], CAD, hx breast cancer s/p left mastectomy and radiation/chemo, tobacco use, COPD, prior PE/DVT, HTN, undergoing workup for possible inflammatory arthritis with Rheumatology, and systolic CHF.    EF (2017) 45-50% (no EF on file prior to this). Had improved to 55-60% on Echo in July 2020.   She was admitted 01/05/21 with A/C CHF exacerbation and likely COPD exacerbation. Echo with EF of 35-40%. No significant MR. Mildly enlarged RV.  R/LHC showed 1v CAD with occluded mRCA and stable hemodynamics.   Echo 08/01/21 EF 50-55% RV moderately down.   S/p DCCV from AFL to NSR in 2023. Started on amiodarone. (Intermittently stopped due to prolonged Qtc)     4/24 had mechanical fall and fractured rt femur, treated w/ IM nailing.   Echo 07/30/23: EF 50%, mild LVH, RV severely reduced, normal MV prosthesis, moderate to severe TR.   Last seen in [facility] 08/10/23 feeling poor. NYHA III, due to fatigue and shortness of breath. Reports had not been responding to PO Torsemide as usual. Torsemide increased to 100 mg daily. Dry weight ~174lbs.   Presented to the [facility] 08/17/23 with hypotension and melena. Vitals were 67/44 with HR 82, RR 24. Labs notable for K 4.1, Na 137, BUN/Cr 52/4.14, Trop 113>121, BNP 547, Hgb 9.4, lactic acid 2.4, and INR >10. She was admitted to the ICU for further management. Warfarin was held for supratherapeutic INR, vitamin K and Kcentra were given with resolution of INR to 1.6. Heparin gtt was started for mechanical valve. She was placed on PPI and given 1u RBCs, hgb remains 9.0. GI was  consulted for GIB and plans to perform EGD. Of note, has had hypoglycemia as low as 29 that is now resolved, diabetes management following.   Repeat echo performed showing D shaped septum and mildly reduced RV function and RV-RA gradient of 25 mmHg, EF 55%. Given significantly weak RV, Advanced Heart Failure was consulted for surgical clearance.    Home Medications Prior to Admission medications   Medication Sig Start Date End Date Taking? Authorizing Provider  albuterol (VENTOLIN HFA) 108 (90 Base) MCG/ACT inhaler INHALE 2 PUFFS BY MOUTH EVERY 6 HOURS AS NEEDED FOR WHEEZING FOR SHORTNESS OF BREATH 07/15/23  Yes Kathleen Lime, MD  FARXIGA 10 MG TABS tablet Take 1 tablet by mouth once daily 06/03/23  Yes Kathleen Lime, MD  fluticasone (FLONASE) 50 MCG/ACT nasal spray Place 2 sprays into both nostrils daily. Patient taking differently: Place 1 spray into both nostrils daily. 05/17/23 05/16/24 Yes Kathleen Lime, MD  gabapentin (NEURONTIN) 800 MG tablet Take 1 tablet (800 mg total) by mouth 3 (three) times daily. 03/01/23  Yes Lovorn, Aundra Millet, MD  HYDROcodone-acetaminophen (NORCO/VICODIN) 5-325 MG tablet Take 1 tablet by mouth every 4 (four) hours as needed for moderate pain (pain score 4-6). 08/05/23  Yes Lovorn, Aundra Millet, MD  losartan (COZAAR) 25 MG tablet Take 0.5 tablets (12.5 mg total) by mouth daily. 08/10/23 11/08/23 Yes Milford, Anderson Malta, FNP  metoprolol succinate (TOPROL-XL) 25 MG 24 hr tablet Take 1 tablet (25 mg total) by mouth  2 (two) times daily. 08/01/23  Yes Monna Fam, MD  potassium chloride (KLOR-CON M) 10 MEQ tablet Take 6 tablets (60 mEq total) by mouth daily. Patient taking differently: Take 60 mEq by mouth 2 (two) times daily. Take 3 tablets in the morning and 3 tablets in the afternoon 08/10/23 03/07/24 Yes Milford, Anderson Malta, FNP  simvastatin (ZOCOR) 20 MG tablet Take 1 tablet (20 mg total) by mouth daily. 12/03/22  Yes Willette Cluster, MD  tetrahydrozoline 0.05 % ophthalmic solution Place 1  drop into both eyes 3 (three) times daily.   Yes [provider]  torsemide (DEMADEX) 100 MG tablet Take 1 tablet (100 mg total) by mouth daily. 08/10/23  Yes Milford, McCarr, FNP  TRELEGY ELLIPTA 100-62.5-25 MCG/ACT AEPB INHALE 1 PUFF ONCE DAILY INTO  THE  LUNGS 07/15/23  Yes Kathleen Lime, MD  warfarin (COUMADIN) 5 MG tablet Take one-and-one-half (1 & 1/2) of your 5 mg peach colored warfarin tablets on Mondays and Thursdays. All other days, take only one (1) tablet. Patient taking differently: Take 5 mg by mouth daily. 04/26/23  Yes Elicia Lamp, RPH-CPP    Past Medical History: Past Medical History:  Diagnosis Date   Arthritis    Asthma    Breast cancer (HCC) 2001   Left Breast Cancer   CHF (congestive heart failure) (HCC)    COPD (chronic obstructive pulmonary disease) (HCC)    Coronary artery disease    GERD (gastroesophageal reflux disease)    History of mitral valve replacement with mechanical valve    HLD (hyperlipidemia)    Hypertension    Pulmonary embolism (HCC) 2021    Past Surgical History: Past Surgical History:  Procedure Laterality Date   APPLICATION OF A-CELL OF CHEST/ABDOMEN Left 01/27/2019   Procedure: APPLICATION OF A-CELL OF CHEST;  Surgeon: Peggye Form, DO;  Location: [facility] OR;  Service: Plastics;  Laterality: Left;   APPLICATION OF WOUND VAC N/A 01/27/2019   Procedure: APPLICATION OF WOUND VAC;  Surgeon: Peggye Form, DO;  Location: [facility] OR;  Service: Plastics;  Laterality: N/A;   BREAST SURGERY Left 2001   for CA   CARDIOVERSION N/A 02/26/2022   Procedure: CARDIOVERSION;  Surgeon: Little Ishikawa, MD;  Location: [facility] ENDOSCOPY;  Service: Cardiovascular;  Laterality: N/A;   COLONOSCOPY WITH PROPOFOL N/A 06/07/2018   Procedure: COLONOSCOPY WITH PROPOFOL;  Surgeon: Charlott Rakes, MD;  Location: [facility] ENDOSCOPY;  Service: Endoscopy;  Laterality: N/A;   DEBRIDEMENT AND CLOSURE WOUND Left 12/28/2018   Procedure: Debridement And  Closure Wound;  Surgeon: Abigail Miyamoto, MD;  Location: [facility] OR;  Service: General;  Laterality: Left;   INCISION AND DRAINAGE OF WOUND Left 01/27/2019   Procedure: IRRIGATION AND DEBRIDEMENT OF CHEST WALL;  Surgeon: Peggye Form, DO;  Location: [facility] OR;  Service: Plastics;  Laterality: Left;   INTRAMEDULLARY (IM) NAIL INTERTROCHANTERIC N/A 09/23/2022   Procedure: INTRAMEDULLARY (IM) NAIL INTERTROCHANTERIC;  Surgeon: Tarry Kos, MD;  Location: [facility] OR;  Service: Orthopedics;  Laterality: N/A;   KNEE ARTHROSCOPY WITH MEDIAL MENISECTOMY Left 08/06/2021   Procedure: KNEE ARTHROSCOPY WITH PARTIAL MEDIAL MENISECTOMY, DEBRIDEMENT;  Surgeon: Jene Every, MD;  Location: [facility] ORS;  Service: Orthopedics;  Laterality: Left;  1 HR   LATISSIMUS FLAP TO BREAST Left 01/18/2019   Procedure: LEFT LATISSIMUS FLAP TO BREAST;  Surgeon: Peggye Form, DO;  Location: [facility] OR;  Service: Plastics;  Laterality: Left;   MASTECTOMY Left    MITRAL VALVE REPLACEMENT     mechanical  POLYPECTOMY  06/07/2018   Procedure: POLYPECTOMY;  Surgeon: Charlott Rakes, MD;  Location: [facility] ENDOSCOPY;  Service: Endoscopy;;   RIGHT/LEFT HEART CATH AND CORONARY ANGIOGRAPHY N/A 01/09/2021   Procedure: RIGHT/LEFT HEART CATH AND CORONARY ANGIOGRAPHY;  Surgeon: Dolores Patty, MD;  Location: [facility] INVASIVE CV LAB;  Service: Cardiovascular;  Laterality: N/A;   WOUND DEBRIDEMENT Left 12/28/2018   Procedure: EXCISION OF LEFT CHRONIC CHEST WALL WOUND;  Surgeon: Abigail Miyamoto, MD;  Location: [facility] OR;  Service: General;  Laterality: Left;    Family History: Family History  Problem Relation Age of Onset   Hypertension Mother    Cancer Father    Hypertension Father    Breast cancer Maternal Aunt 50   Heart attack Other     Social History: Social History   Socioeconomic History   Marital status: Single    Spouse name: Not on file   Number of children: Not on file   Years of education: Not on file   Highest education  level: Not on file  Occupational History   Not on file  Tobacco Use   Smoking status: Every Day    Current packs/day: 0.10    Average packs/day: 0.1 packs/day for 30.0 years (3.0 ttl pk-yrs)    Types: Cigarettes   Smokeless tobacco: Never   Tobacco comments:    5 cigs per day  Vaping Use   Vaping status: Never Used  Substance and Sexual Activity   Alcohol use: Yes    Alcohol/week: 3.0 standard drinks of alcohol    Types: 3 Standard drinks or equivalent per week    Comment: beer- ocasional   Drug use: No   Sexual activity: Not Currently    Birth control/protection: None  Other Topics Concern   Not on file  Social History Narrative   ** Merged History Encounter **       Social Drivers of Health   Financial Resource Strain: Low Risk  (12/01/2022)   Overall Financial Resource Strain (CARDIA)    Difficulty of Paying Living Expenses: Not very hard  Food Insecurity: No Food Insecurity (08/18/2023)   Hunger Vital Sign    Worried About Running Out of Food in the Last Year: Never true    Ran Out of Food in the Last Year: Never true  Transportation Needs: No Transportation Needs (08/18/2023)   PRAPARE - Administrator, Civil Service (Medical): No    Lack of Transportation (Non-Medical): No  Physical Activity: Inactive (12/01/2022)   Exercise Vital Sign    Days of Exercise per Week: 0 days    Minutes of Exercise per Session: 10 min  Stress: Stress Concern Present (12/01/2022)   [employer] of Occupational Health - Occupational Stress Questionnaire    Feeling of Stress : To some extent  Social Connections: Patient Declined (08/18/2023)   Social Connection and Isolation Panel [NHANES]    Frequency of Communication with Friends and Family: Patient declined    Frequency of Social Gatherings with Friends and Family: Patient declined    Attends Religious Services: Patient declined    Database administrator or Organizations: Patient declined    Attends Tax inspector Meetings: Patient declined    Marital Status: Patient declined    Allergies:  Allergies  Allergen Reactions   Lisinopril Swelling   Acetaminophen-Codeine Itching   Propoxyphene Itching    Darvocet   Tape Rash    Plastic   Tramadol Itching, Nausea And Vomiting and Rash    Objective:  Vital Signs:   Temp:  [98.3 F (36.8 C)-98.9 F (37.2 C)] 98.6 F (37 C) (02/27 0409) Pulse Rate:  [82-92] 87 (02/27 0800) Resp:  [13-27] 13 (02/27 0800) BP: (71-168)/(56-132) 110/89 (02/27 0800) SpO2:  [84 %-100 %] 97 % (02/27 0830) Last BM Date : 08/18/23  Weight change: Filed Weights   08/17/23 2012 08/18/23 0338 08/18/23 0356  Weight: 74.4 kg 51.2 kg 51.2 kg    Intake/Output:   Intake/Output Summary (Last 24 hours) at 08/19/2023 1036 Last data filed at 08/19/2023 0800 Gross per 24 hour  Intake 1562.91 ml  Output 1000 ml  Net 562.91 ml    CVP: 15  Physical Exam    General: Elderly appearing. No distress on [place]  Cardiac: S1 and S2 present. No murmurs or rub. Mechanical click Extremities: Warm and dry. No rash, cyanosis. No peripheral edema.  Neuro: Alert and oriented x3. Affect pleasant. Moves all extremities without difficulty. Lines/Devices:  RUE PICC  Telemetry   SR 80s (personally reviewed)  EKG    N/A  Labs   Basic Metabolic Panel: Recent Labs  Lab 08/17/23 2035 08/18/23 0137 08/18/23 0234 08/19/23 0457  NA 137 139 139 139  K 4.1 4.6 4.2 3.3*  CL 101 106 105 102  CO2 19* 19* 17* 19*  GLUCOSE 61* 66* 64* 92  BUN 52* 58* 58* 59*  CREATININE 4.14* 4.34* 4.27* 3.12*  CALCIUM 7.0* 6.6* 6.8* 7.5*  MG  --  1.5* 1.4*  --   PHOS  --   --  3.9  --    Liver Function Tests: Recent Labs  Lab 08/17/23 2035 08/18/23 0137 08/19/23 0457  AST 56* 55* 47*  ALT 18 17 18   ALKPHOS 140* 132* 120  BILITOT 1.5* 1.3* 2.1*  PROT 6.7 6.0* 6.0*  ALBUMIN 2.4* 2.2* 2.1*   Recent Labs  Lab 08/18/23 0212  LIPASE 19  AMYLASE 73   No results for  input(s): "AMMONIA" in the last 168 hours.  CBC: Recent Labs  Lab 08/17/23 2035 08/18/23 0137 08/18/23 0234 08/18/23 0758 08/18/23 1624 08/18/23 2132 08/19/23 0457  WBC 9.4 9.9 9.8  --  10.5 11.6* 11.9*  NEUTROABS 5.1 5.6  --   --   --   --   --   HGB 9.4* 8.7* 8.4* 9.0* 8.4* 8.1* 8.2*  HCT 31.2* 29.3* 27.6* 29.4* 26.9* 25.9* 25.8*  MCV 92.9 93.3 92.9  --  90.9 91.8 90.5  PLT 419* 434* 409*  --  430* 406* 432*   Cardiac Enzymes: No results for input(s): "CKTOTAL", "CKMB", "CKMBINDEX", "TROPONINI" in the last 168 hours.  BNP: BNP (last 3 results) Recent Labs    01/19/23 1601 07/29/23 1347 08/17/23 2035  BNP 264.0* 374.8* 547.9*    ProBNP (last 3 results) No results for input(s): "PROBNP" in the last 8760 hours.   CBG: Recent Labs  Lab 08/18/23 1628 08/18/23 1952 08/19/23 0019 08/19/23 0408 08/19/23 0722  GLUCAP 113* 110* 71 82 88   Coagulation Studies: Recent Labs    08/18/23 0137 08/18/23 0758 08/19/23 0457  LABPROT >90.0* 19.1* 17.3*  INR >10.0* 1.6* 1.4*   Imaging   No results found.  Medications:    Current Medications:  Chlorhexidine Gluconate Cloth  6 each Topical Daily   fluticasone furoate-vilanterol  1 puff Inhalation Daily   And   umeclidinium bromide  1 puff Inhalation Daily   insulin aspart  0-15 Units Subcutaneous Q4H   pantoprazole (PROTONIX) IV  40 mg Intravenous Q12H  sodium chloride flush  10-40 mL Intracatheter Q12H    Infusions:  dextrose 20 mL/hr at 08/19/23 1013   heparin 900 Units/hr (08/19/23 0900)   Patient Profile   Kim Anthony is a 73 y.o. female with a history of mechanical MVR in 2001 in [place], [place], CAD, hx breast cancer s/p left mastectomy and radiation/chemo, tobacco use, COPD, prior PE/DVT, HTN, undergoing workup for possible inflammatory arthritis with Rheumatology, and systolic CHF.   Assessment/Plan   GIB Blood loss anemia - hypotension with melena requiring RBCs - echo with evidence of RV strain  and overload, would diurese to goal CVP 10 prior to EGD.  - will given Lasix 80 mg IV bid today and follow response - bleeding management per CCM  2. Acute on chronic RV Failure HFimpEF Pulmonary HTN  - Echo (2017): EF 45-50% in setting of PE. Has mild to moderate RV dysfunction. MV prosthesis okay.  - Over the years has had EF recovery with RV dysfunction with relapses to 35-40% with RV dysfunction in the setting of AF RVR. - R/LHC (7/22): EF 35-40% with 1v CAD mRCA chronically occluded and well compensated hemodynamics with mild pulmonary HTN and normal CO - Suspect likely underlying OSA contributing to PH/RV failure. Also w/ known COPD. - Echo 2/25: EF 50%, mild LVH, RV severely reduced, normal MV prosthesis, moderate to severe TR. - Repeat yesterday with EF 55% and D-shaped septum, RV-RA gradient 25 mmHg and severe TR (does not coapt) - NYHA III-IV on admit - Co-ox 59%. CVP 15, with RV overload by echo. Start Lasix 80 mg bid. Follow response. Goal <=10.  - Hold GDMT with hypotension and bleeding  3. AKI - in the setting of RV congestion - Cr very fluctuant over last year, baseline appears 1.1-2.1 - Cr 4.14 on admit, down to 3.12 - Diuresis as above - Avoid hypotension  4. PAF - She does not do well in AFL/AF. - s/p TEE and DCCV 9/23 to NSR. - On/Off amio for prolonged QTc - Recent admit 2/25 with AFL, converted to NSR on amio, but then stopped QTc 510 msec - In NSR on tele - Continue heparin gtt.   5. CAD  Elevated Troponin - 1 V CAD with occluded mid RCA on cath (7/22). - hsTrop [id number]. Likely demand ischemia, decongest and follow - EKG without ST changes - No s/s angina. - on heparin - continue statin    6. Hx of mechanical mitral valve replacement: - Mechanical MVR in [place] in 2001. - Stable on echo 2/25 MV gradient 3 - On heparin with bleed - Continue SBE prophylaxis.   7. Tricuspid valve regurgitation - severe on echo 2/25 - Follow     8. Gout -  has been taking NSAIDs and Prednisone at home.  Length of Stay: 1  CRITICAL CARE Performed by: [place] Lee  Total critical care time: 18 minutes  Critical care time was exclusive of separately billable procedures and treating other patients.  Critical care was necessary to treat or prevent imminent or life-threatening deterioration.  Critical care was time spent personally by me on the following activities: development of treatment plan with patient and/or surrogate as well as nursing, discussions with consultants, evaluation of patient's response to treatment, examination of patient, obtaining history from patient or surrogate, ordering and performing treatments and interventions, ordering and review of laboratory studies, ordering and review of radiographic studies, pulse oximetry and re-evaluation of patient's condition.  [place] Lee, NP  08/19/2023, 10:36 AM  Advanced Heart Failure Team Pager [phone] (M-F; 7a - 5p)  Please contact [facility] for night-coverage after hours (4p -7a ) and weekends on [email or web address]  Agree with above  15 y/o woman with h/o MV disease s/p mechanical MVR in 2001. Subsequently developed significant RV failure with severe TR which has been managed medically. Also has 1v CAD  At baseline NYHA III but stable.   Admitted with acute GIB.   Echo 2/25: EF 50%, mild LVH, RV severely reduced, normal MV prosthesis, moderate to severe TR.  We are asked to provide assistance with management and pre-op CV clearance  She denies any current CP or SOB. No recent syncope SBP stable  Co-ox 59% CVP 15  General:  Elderly . No resp difficulty HEENT: normal Neck: supple. no JVD. Carotids 2+ bilat; no bruits. No lymphadenopathy or thryomegaly appreciated. Cor: PMI nondisplaced. Regular rate & rhythm. Mechanical s1 3/6 TR Lungs: clear Abdomen: soft, nontender, nondistended. No hepatosplenomegaly. No bruits or masses. Good bowel sounds. Extremities: no cyanosis,  clubbing, rash, edema Neuro: alert & orientedx3, cranial nerves grossly intact. moves all 4 extremities w/o difficulty. Affect pleasant  Overall moderate to high-risk for sedation but given need for ongoing anticoagulation for MVR I think she will need EGD to further evaluate source of bleed. With co-ox 59% I think she can proceed. If developed hypotension would treat with NE.   Would start IV amio to prevent recurrent AF which she does not tolerate well.   We will follow.   CRITICAL CARE Performed by: Arvilla Meres  Total critical care time: 50 minutes  Critical care time was exclusive of separately billable procedures and treating other patients.  Critical care was necessary to treat or prevent imminent or life-threatening deterioration.  Critical care was time spent personally by me (independent of midlevel providers or residents) on the following activities: development of treatment plan with patient and/or surrogate as well as nursing, discussions with consultants, evaluation of patient's response to treatment, examination of patient, obtaining history from patient or surrogate, ordering and performing treatments and interventions, ordering and review of laboratory studies, ordering and review of radiographic studies, pulse oximetry and re-evaluation of patient's condition.  Arvilla Meres, MD  5:38 PM

## 2023-08-19 NOTE — Plan of Care (Signed)
  Problem: Nutritional: Goal: Maintenance of adequate nutrition will improve Outcome: Progressing   Problem: Skin Integrity: Goal: Risk for impaired skin integrity will decrease Outcome: Progressing   Problem: Tissue Perfusion: Goal: Adequacy of tissue perfusion will improve Outcome: Progressing   Problem: Clinical Measurements: Goal: Will remain free from infection Outcome: Progressing Goal: Diagnostic test results will improve Outcome: Progressing Goal: Respiratory complications will improve Outcome: Progressing Goal: Cardiovascular complication will be avoided Outcome: Progressing   Problem: Coping: Goal: Level of anxiety will decrease Outcome: Progressing

## 2023-08-20 ENCOUNTER — Encounter (HOSPITAL_COMMUNITY)
Admission: EM | Disposition: A | Discharge status: Home Health | Payer: Self-pay | Source: Ambulatory Visit | Attending: Internal Medicine

## 2023-08-20 ENCOUNTER — Encounter (HOSPITAL_COMMUNITY): Payer: Self-pay | Admitting: Certified Registered Nurse Anesthetist

## 2023-08-20 ENCOUNTER — Encounter (HOSPITAL_COMMUNITY): Payer: Self-pay | Admitting: Internal Medicine

## 2023-08-20 ENCOUNTER — Inpatient Hospital Stay (HOSPITAL_COMMUNITY): Payer: 59

## 2023-08-20 DIAGNOSIS — K922 Gastrointestinal hemorrhage, unspecified: Secondary | ICD-10-CM | POA: Diagnosis not present

## 2023-08-20 DIAGNOSIS — I50810 Right heart failure, unspecified: Secondary | ICD-10-CM | POA: Diagnosis not present

## 2023-08-20 DIAGNOSIS — I480 Paroxysmal atrial fibrillation: Secondary | ICD-10-CM | POA: Diagnosis not present

## 2023-08-20 DIAGNOSIS — I272 Pulmonary hypertension, unspecified: Secondary | ICD-10-CM

## 2023-08-20 DIAGNOSIS — I4891 Unspecified atrial fibrillation: Secondary | ICD-10-CM | POA: Diagnosis not present

## 2023-08-20 DIAGNOSIS — N179 Acute kidney failure, unspecified: Secondary | ICD-10-CM | POA: Diagnosis not present

## 2023-08-20 HISTORY — PX: RIGHT HEART CATH: CATH118263

## 2023-08-20 HISTORY — DX: Pulmonary hypertension, unspecified: I27.20

## 2023-08-20 LAB — CBC
HCT: 25.6 % — ABNORMAL LOW (ref 36.0–46.0)
HCT: 24.5 % — ABNORMAL LOW (ref 36.0–46.0)
Hemoglobin: 7.9 g/dL — ABNORMAL LOW (ref 12.0–15.0)
Hemoglobin: 7.7 g/dL — ABNORMAL LOW (ref 12.0–15.0)
MCH: 28 pg (ref 26.0–34.0)
MCH: 29.1 pg (ref 26.0–34.0)
MCHC: 30.9 g/dL (ref 30.0–36.0)
MCHC: 31.4 g/dL (ref 30.0–36.0)
MCV: 90.8 fL (ref 80.0–100.0)
MCV: 92.5 fL (ref 80.0–100.0)
Platelets: 433 10*3/uL — ABNORMAL HIGH (ref 150–400)
Platelets: 440 10*3/uL — ABNORMAL HIGH (ref 150–400)
RBC: 2.82 MIL/uL — ABNORMAL LOW (ref 3.87–5.11)
RBC: 2.65 MIL/uL — ABNORMAL LOW (ref 3.87–5.11)
RDW: 16.3 % — ABNORMAL HIGH (ref 11.5–15.5)
RDW: 16.7 % — ABNORMAL HIGH (ref 11.5–15.5)
WBC: 11.6 10*3/uL — ABNORMAL HIGH (ref 4.0–10.5)
WBC: 11.3 10*3/uL — ABNORMAL HIGH (ref 4.0–10.5)
nRBC: 0.9 % — ABNORMAL HIGH (ref 0.0–0.2)
nRBC: 0.9 % — ABNORMAL HIGH (ref 0.0–0.2)

## 2023-08-20 LAB — GLUCOSE, CAPILLARY
Glucose-Capillary: 102 mg/dL — ABNORMAL HIGH (ref 70–99)
Glucose-Capillary: 127 mg/dL — ABNORMAL HIGH (ref 70–99)
Glucose-Capillary: 128 mg/dL — ABNORMAL HIGH (ref 70–99)
Glucose-Capillary: 136 mg/dL — ABNORMAL HIGH (ref 70–99)
Glucose-Capillary: 87 mg/dL (ref 70–99)
Glucose-Capillary: 89 mg/dL (ref 70–99)
Glucose-Capillary: 96 mg/dL (ref 70–99)

## 2023-08-20 LAB — POCT I-STAT EG7
Acid-base deficit: 5 mmol/L — ABNORMAL HIGH (ref 0.0–2.0)
Acid-base deficit: 4 mmol/L — ABNORMAL HIGH (ref 0.0–2.0)
Bicarbonate: 20.7 mmol/L (ref 20.0–28.0)
Bicarbonate: 21.2 mmol/L (ref 20.0–28.0)
Calcium, Ion: 1.12 mmol/L — ABNORMAL LOW (ref 1.15–1.40)
Calcium, Ion: 1.18 mmol/L (ref 1.15–1.40)
HCT: 27 % — ABNORMAL LOW (ref 36.0–46.0)
HCT: 28 % — ABNORMAL LOW (ref 36.0–46.0)
Hemoglobin: 9.2 g/dL — ABNORMAL LOW (ref 12.0–15.0)
Hemoglobin: 9.5 g/dL — ABNORMAL LOW (ref 12.0–15.0)
O2 Saturation: 57 %
O2 Saturation: 53 %
Potassium: 4 mmol/L (ref 3.5–5.1)
Potassium: 4.1 mmol/L (ref 3.5–5.1)
Sodium: 139 mmol/L (ref 135–145)
Sodium: 138 mmol/L (ref 135–145)
TCO2: 22 mmol/L (ref 22–32)
TCO2: 22 mmol/L (ref 22–32)
pCO2, Ven: 39.2 mm[Hg] — ABNORMAL LOW (ref 44–60)
pCO2, Ven: 40.6 mm[Hg] — ABNORMAL LOW (ref 44–60)
pH, Ven: 7.33 (ref 7.25–7.43)
pH, Ven: 7.327 (ref 7.25–7.43)
pO2, Ven: 32 mm[Hg] (ref 32–45)
pO2, Ven: 30 mm[Hg] — CL (ref 32–45)

## 2023-08-20 LAB — COOXEMETRY PANEL
Carboxyhemoglobin: 2.4 % — ABNORMAL HIGH (ref 0.5–1.5)
Carboxyhemoglobin: 2.5 % — ABNORMAL HIGH (ref 0.5–1.5)
Methemoglobin: <0.7 % (ref 0.0–1.5)
Methemoglobin: <0.7 % (ref 0.0–1.5)
O2 Saturation: 54.4 %
O2 Saturation: 58.9 %
Total hemoglobin: 8.2 g/dL — ABNORMAL LOW (ref 12.0–16.0)
Total hemoglobin: 8.2 g/dL — ABNORMAL LOW (ref 12.0–16.0)

## 2023-08-20 LAB — COMPREHENSIVE METABOLIC PANEL
ALT: 17 U/L (ref 0–44)
AST: 43 U/L — ABNORMAL HIGH (ref 15–41)
Albumin: 2.1 g/dL — ABNORMAL LOW (ref 3.5–5.0)
Alkaline Phosphatase: 124 U/L (ref 38–126)
Anion gap: 12 (ref 5–15)
BUN: 51 mg/dL — ABNORMAL HIGH (ref 8–23)
CO2: 21 mmol/L — ABNORMAL LOW (ref 22–32)
Calcium: 8.4 mg/dL — ABNORMAL LOW (ref 8.9–10.3)
Chloride: 104 mmol/L (ref 98–111)
Creatinine, Ser: 2.17 mg/dL — ABNORMAL HIGH (ref 0.44–1.00)
GFR, Estimated: 23 mL/min — ABNORMAL LOW (ref >60)
Glucose, Bld: 115 mg/dL — ABNORMAL HIGH (ref 70–99)
Potassium: 3.7 mmol/L (ref 3.5–5.1)
Sodium: 137 mmol/L (ref 135–145)
Total Bilirubin: 1.4 mg/dL — ABNORMAL HIGH (ref 0.0–1.2)
Total Protein: 6.2 g/dL — ABNORMAL LOW (ref 6.5–8.1)

## 2023-08-20 LAB — HEPARIN LEVEL (UNFRACTIONATED): Heparin Unfractionated: <0.1 [IU]/mL — ABNORMAL LOW (ref 0.30–0.70)

## 2023-08-20 SURGERY — RIGHT HEART CATH
Anesthesia: LOCAL

## 2023-08-20 SURGERY — ESOPHAGOGASTRODUODENOSCOPY (EGD) WITH PROPOFOL
Anesthesia: Monitor Anesthesia Care | Laterality: Left

## 2023-08-20 MED ORDER — LIDOCAINE HCL (PF) 1 % IJ SOLN
INTRAMUSCULAR | Status: AC
  Filled 2023-08-20: qty 30

## 2023-08-20 MED ORDER — FUROSEMIDE 10 MG/ML IJ SOLN
120.0000 mg | Freq: Two times a day (BID) | INTRAVENOUS | Status: AC
  Administered 2023-08-20 (×2): 120 mg via INTRAVENOUS
  Filled 2023-08-20 (×2): qty 12
  Filled 2023-08-20: qty 10

## 2023-08-20 MED ORDER — HEPARIN (PORCINE) 25000 UT/250ML-% IV SOLN
1700.0000 [IU]/h | INTRAVENOUS | Status: DC
  Administered 2023-08-20: 900 [IU]/h via INTRAVENOUS
  Administered 2023-08-21: 1100 [IU]/h via INTRAVENOUS
  Administered 2023-08-22: 1400 [IU]/h via INTRAVENOUS
  Filled 2023-08-20 (×5): qty 250

## 2023-08-20 MED ORDER — HEPARIN (PORCINE) IN NACL 1000-0.9 UT/500ML-% IV SOLN
INTRAVENOUS | Status: DC | PRN
  Administered 2023-08-20: 1000 mL

## 2023-08-20 MED ORDER — SODIUM CHLORIDE 0.9 % IV SOLN: INTRAVENOUS | Status: AC

## 2023-08-20 MED ORDER — SODIUM CHLORIDE 0.9% IV SOLUTION: INTRAVENOUS | Status: DC

## 2023-08-20 MED ORDER — ONDANSETRON HCL 4 MG/2ML IJ SOLN: 4.0000 mg | Freq: Four times a day (QID) | INTRAMUSCULAR | Status: DC | PRN

## 2023-08-20 MED ORDER — POTASSIUM CHLORIDE 20 MEQ PO PACK
40.0000 meq | PACK | ORAL | Status: AC
  Administered 2023-08-20 (×2): 40 meq via ORAL
  Filled 2023-08-20 (×2): qty 2

## 2023-08-20 MED ORDER — HYDRALAZINE HCL 20 MG/ML IJ SOLN: 10.0000 mg | INTRAMUSCULAR | Status: AC | PRN

## 2023-08-20 MED ORDER — SODIUM CHLORIDE 0.9% IV SOLUTION: INTRAVENOUS | Status: DC | PRN

## 2023-08-20 MED ORDER — SODIUM CHLORIDE 0.9% FLUSH: 3.0000 mL | INTRAVENOUS | Status: DC | PRN

## 2023-08-20 MED ORDER — ACETAMINOPHEN 325 MG PO TABS
650.0000 mg | ORAL_TABLET | ORAL | Status: DC | PRN
  Administered 2023-08-20 – 2023-09-07 (×12): 650 mg via ORAL
  Filled 2023-08-20 (×14): qty 2

## 2023-08-20 MED ORDER — MILRINONE LACTATE IN DEXTROSE 20-5 MG/100ML-% IV SOLN
0.1250 ug/kg/min | INTRAVENOUS | Status: DC
  Administered 2023-08-20 – 2023-08-26 (×5): 0.125 ug/kg/min via INTRAVENOUS
  Administered 2023-08-28: 0.25 ug/kg/min via INTRAVENOUS
  Administered 2023-08-30: 0.125 ug/kg/min via INTRAVENOUS
  Filled 2023-08-20 (×8): qty 100

## 2023-08-20 MED ORDER — LIDOCAINE HCL (PF) 1 % IJ SOLN
INTRAMUSCULAR | Status: DC | PRN
  Administered 2023-08-20: 5 mL

## 2023-08-20 MED ORDER — FENTANYL CITRATE (PF) 100 MCG/2ML IJ SOLN
INTRAMUSCULAR | Status: DC | PRN
  Administered 2023-08-20: 25 ug via INTRAVENOUS

## 2023-08-20 MED ORDER — LABETALOL HCL 5 MG/ML IV SOLN: 10.0000 mg | INTRAVENOUS | Status: DC | PRN

## 2023-08-20 MED ORDER — FENTANYL CITRATE (PF) 100 MCG/2ML IJ SOLN
INTRAMUSCULAR | Status: AC
  Filled 2023-08-20: qty 2

## 2023-08-20 MED ORDER — CLONAZEPAM 0.5 MG PO TABS
0.5000 mg | ORAL_TABLET | Freq: Once | ORAL | Status: AC
  Administered 2023-08-20: 0.5 mg via ORAL
  Filled 2023-08-20: qty 1

## 2023-08-20 MED ORDER — SODIUM CHLORIDE 0.9 % IV SOLN: 250.0000 mL | INTRAVENOUS | Status: AC | PRN

## 2023-08-20 MED ORDER — NOREPINEPHRINE 4 MG/250ML-% IV SOLN
0.0000 ug/min | INTRAVENOUS | Status: DC
  Administered 2023-08-20: 4 ug/min via INTRAVENOUS
  Administered 2023-08-20 – 2023-08-21 (×3): 5 ug/min via INTRAVENOUS
  Administered 2023-08-22: 4 ug/min via INTRAVENOUS
  Administered 2023-08-23: 2 ug/min via INTRAVENOUS
  Administered 2023-08-25: 3 ug/min via INTRAVENOUS
  Administered 2023-08-26 – 2023-08-27 (×3): 7 ug/min via INTRAVENOUS
  Administered 2023-08-27: 5 ug/min via INTRAVENOUS
  Filled 2023-08-20 (×13): qty 250

## 2023-08-20 MED ORDER — SODIUM CHLORIDE 0.9% FLUSH
3.0000 mL | Freq: Two times a day (BID) | INTRAVENOUS | Status: DC
  Administered 2023-08-20 – 2023-08-21 (×2): 3 mL via INTRAVENOUS

## 2023-08-20 SURGICAL SUPPLY — 9 items
CATH SWAN GANZ 7F STRAIGHT (CATHETERS) IMPLANT
GLIDESHEATH SLENDER 7FR .021G (SHEATH) IMPLANT
PACK CARDIAC CATHETERIZATION (CUSTOM PROCEDURE TRAY) ×1 IMPLANT
SHEATH PINNACLE 7F 10CM (SHEATH) IMPLANT
SHEATH PROBE COVER 6X72 (BAG) IMPLANT
SHIELD CATH-GARD CONTAMINATION (MISCELLANEOUS) IMPLANT
TRANSDUCER W/STOPCOCK (MISCELLANEOUS) IMPLANT
TUBING ART PRESS 72 MALE/FEM (TUBING) IMPLANT
WIRE MICRO SET SILHO 5FR 7 (SHEATH) IMPLANT

## 2023-08-20 NOTE — H&P (View-Only) (Signed)
 Advanced Heart Failure Rounding Note  Cardiologist: Arvilla Meres, MD  Chief Complaint: RV Failure Subjective:    1.6L UOP. Overall net positive from yesterday. Received Lasix IV 80 mg once. Evening dose held for hypotension.  CVP 21 (personally obtained) SBP 70-80 overnight.  Feeling okay this morning. Tired. Having some pain in her shoulder. + Productive, cough. Mild SOB.   Objective:   Weight Range: 51.4 kg Body mass index is 18.86 kg/m.   Vital Signs:   Temp:  [98.1 F (36.7 C)-98.4 F (36.9 C)] 98.1 F (36.7 C) (02/28 0425) Pulse Rate:  [74-92] 76 (02/28 0602) Resp:  [13-31] 18 (02/28 0602) BP: (73-139)/(41-98) 93/45 (02/28 0602) SpO2:  [64 %-100 %] 98 % (02/28 0602) Weight:  [51.4 kg] 51.4 kg (02/28 0500) Last BM Date : 08/18/23  Weight change: Filed Weights   08/18/23 0338 08/18/23 0356 08/20/23 0500  Weight: 51.2 kg 51.2 kg 51.4 kg   Intake/Output:   Intake/Output Summary (Last 24 hours) at 08/20/2023 0714 Last data filed at 08/20/2023 0600 Gross per 24 hour  Intake 1576.34 ml  Output 1650 ml  Net -73.66 ml    CVP: 21  Physical Exam    General: Weak appearing. No distress on [place] Cardiac: JVP to jaw. S1 and S2 present. Mechanical click Abdomen: Taut, mildly distended.  Extremities: Warm and dry. 2+ BLE edema.  Neuro: Alert and oriented x3. Affect pleasant. Moves all extremities without difficulty. Lines/Devices:  RUE PICC  Telemetry   SR in 70s (personally reviewed)  EKG    N/A  Labs    CBC Recent Labs    08/17/23 2035 08/18/23 0137 08/18/23 0234 08/19/23 2338 08/20/23 0521  WBC 9.4 9.9   < > 10.8* 11.6*  NEUTROABS 5.1 5.6  --   --   --   HGB 9.4* 8.7*   < > 7.3* 7.9*  HCT 31.2* 29.3*   < > 23.3* 25.6*  MCV 92.9 93.3   < > 91.0 90.8  PLT 419* 434*   < > 379 433*   < > = values in this interval not displayed.   Basic Metabolic Panel Recent Labs    40/98/11 0234 08/19/23 0457 08/19/23 1216 08/19/23 1300 08/20/23 0521   NA 139   < > 139  --  137  K 4.2   < > 3.3*  --  3.7  CL 105   < > 106  --  104  CO2 17*   < > 21*  --  21*  GLUCOSE 64*   < > 98  --  115*  BUN 58*   < > 57*  --  51*  CREATININE 4.27*   < > 2.81*  --  2.17*  CALCIUM 6.8*   < > 7.3*  --  8.4*  MG 1.4*  --   --  2.1  --   PHOS 3.9  --   --   --   --    < > = values in this interval not displayed.   Liver Function Tests Recent Labs    08/19/23 0457 08/19/23 1300 08/20/23 0521  AST 47*  --  43*  ALT 18  --  17  ALKPHOS 120  --  124  BILITOT 2.1* 1.6* 1.4*  PROT 6.0*  --  6.2*  ALBUMIN 2.1*  --  2.1*   Recent Labs    08/18/23 0212  LIPASE 19  AMYLASE 73   Cardiac Enzymes No results for input(s): "CKTOTAL", "CKMB", "  CKMBINDEX", "TROPONINI" in the last 72 hours.  BNP: BNP (last 3 results) Recent Labs    01/19/23 1601 07/29/23 1347 08/17/23 2035  BNP 264.0* 374.8* 547.9*   ProBNP (last 3 results) No results for input(s): "PROBNP" in the last 8760 hours.  D-Dimer No results for input(s): "DDIMER" in the last 72 hours. Hemoglobin A1C Recent Labs    08/18/23 0212  HGBA1C 6.0*   Fasting Lipid Panel No results for input(s): "CHOL", "HDL", "LDLCALC", "TRIG", "CHOLHDL", "LDLDIRECT" in the last 72 hours. Thyroid Function Tests No results for input(s): "TSH", "T4TOTAL", "T3FREE", "THYROIDAB" in the last 72 hours.  Invalid input(s): "FREET3"  Other results:   Imaging   US Abdomen Complete Result Date: 08/19/2023 CLINICAL DATA:  Acute kidney injury.  Elevated bilirubin level. EXAM: ABDOMEN ULTRASOUND COMPLETE COMPARISON:  10/08/2022 renal ultrasound FINDINGS: Gallbladder: Suspect at least 1 dependent gallstone of 4 mm. No wall thickening or pericholecystic fluid. Sonographic Murphy's sign was not elicited. Common bile duct: Diameter: Mildly dilated for age at between 9 and 12 mm. No intrahepatic duct dilatation. Liver: No focal lesion identified. Within normal limits in parenchymal echogenicity. Portal vein is  patent on color Doppler imaging with normal direction of blood flow towards the liver. IVC: No abnormality visualized. Pancreas: Visualized portion unremarkable. Spleen: Size and appearance within normal limits. Right Kidney: Length: 11.6 cm. Echogenicity within normal limits. No mass or hydronephrosis visualized. Left Kidney: Length: 10.9 cm. Echogenicity within normal limits. No mass or hydronephrosis visualized. Abdominal aorta: No aneurysm visualized. Other findings: No ascites. Portions of exam are degraded by bowel gas. IMPRESSION: No explanation for acute renal insufficiency. Probable cholelithiasis without acute cholecystitis. Mild common duct dilatation for age. Given elevated bilirubin, consider MRCP to exclude distal obstruction or stone. Electronically Signed   By: Jeronimo Greaves M.D.   On: 08/19/2023 12:21    Medications:    Scheduled Medications:  Chlorhexidine Gluconate Cloth  6 each Topical Daily   fluticasone furoate-vilanterol  1 puff Inhalation Daily   And   umeclidinium bromide  1 puff Inhalation Daily   insulin aspart  0-15 Units Subcutaneous Q4H   pantoprazole (PROTONIX) IV  40 mg Intravenous Q12H   sodium chloride flush  10-40 mL Intracatheter Q12H    Infusions:  amiodarone 30 mg/hr (08/20/23 0600)   dextrose 20 mL/hr at 08/20/23 0600   furosemide     norepinephrine (LEVOPHED) Adult infusion      PRN Medications: docusate sodium, HYDROmorphone (DILAUDID) injection, ondansetron (ZOFRAN) IV, mouth rinse, polyethylene glycol, sodium chloride flush  Patient Profile   Kim Anthony is a 73 y.o. female with a history of mechanical MVR in 2001 in [place], [place], CAD, hx breast cancer s/p left mastectomy and radiation/chemo, tobacco use, COPD, prior PE/DVT, HTN, undergoing workup for possible inflammatory arthritis with Rheumatology, and systolic CHF.   Assessment/Plan   GIB - EGD clearance Hypotension Blood loss anemia - hypotension with melena requiring RBCs - echo with  evidence of RV strain and overload, would diurese to goal CVP 10 prior to EGD.  - No BM or melena overnight. - hgb 8.1>7.3>7.9  - weight up from yesterday. CVP 21 (personally obtained) will given Lasix 120 mg IV bid today. Evening lasix held for hypotension. - Start NE to assist with BP and CO - bleeding management per CCM   2. Acute on chronic RV Failure HFimpEF Pulmonary HTN  - Echo (2017): EF 45-50% in setting of PE. Has mild to moderate RV dysfunction. MV prosthesis okay.  -  Over the years has had EF recovery with RV dysfunction with relapses to 35-40% with RV dysfunction in the setting of AF RVR. - R/LHC (7/22): EF 35-40% with 1v CAD mRCA chronically occluded and well compensated hemodynamics with mild pulmonary HTN and normal CO - Suspect likely underlying OSA contributing to PH/RV failure. Also w/ known COPD. - Echo 2/25: EF 50%, mild LVH, RV severely reduced, normal MV prosthesis, moderate to severe TR. - Repeat yesterday with EF 55% and D-shaped septum, RV-RA gradient 25 mmHg and severe TR (does not coapt) - NYHA III-IV on admit - Co-ox 49>54%. Fick CO/CI 4.2/2.7.  - CVP 21 (goal <=10). Net positive from yesterday. Weight up. Evening dose Lasix held for hypotension. - Give Lasix 120 mg IV bid, would not hold - Start NE for BP support. - Hold GDMT with hypotension and bleeding - Will plan for RHC with leave-in [place] cath placement   3. AKI - in the setting of RV congestion - Cr very fluctuant over last year, baseline appears 1.1-2.1 - Cr 4.14 on admit, improving - 2.17 today - Diuresis as above - Avoid hypotension as above   4. PAF - She does not do well in AFL/AF. - s/p TEE and DCCV 9/23 to NSR. - On/Off PO amio for prolonged QTc - Recent admit 2/25 with AFL, converted to NSR on amio, but then stopped QTc 510 msec - In NSR on tele - Continue heparin gtt. - continue amio gtt   5. CAD  Elevated Troponin - 1 V CAD with occluded mid RCA on cath (7/22). - hsTrop  [id number]. Likely demand ischemia, decongest and follow - EKG without ST changes - No s/s angina. - on heparin - continue statin    6. Hx of mechanical mitral valve replacement: - Mechanical MVR in [place] in 2001. - Stable on echo 2/25 MV gradient 3 - On heparin with bleed - Continue SBE prophylaxis.   7. Tricuspid valve regurgitation - severe on echo 2/25 - Follow    8. Gout - has been taking NSAIDs and Prednisone at home.   Length of Stay: 2  CRITICAL CARE Performed by: [place] Lee  Total critical care time: 20 minutes  Critical care time was exclusive of separately billable procedures and treating other patients.  Critical care was necessary to treat or prevent imminent or life-threatening deterioration.  Critical care was time spent personally by me on the following activities: development of treatment plan with patient and/or surrogate as well as nursing, discussions with consultants, evaluation of patient's response to treatment, examination of patient, obtaining history from patient or surrogate, ordering and performing treatments and interventions, ordering and review of laboratory studies, ordering and review of radiographic studies, pulse oximetry and re-evaluation of patient's condition.  [place] Lee, NP  08/20/2023, 7:14 AM  Advanced Heart Failure Team Pager [phone] (M-F; 7a - 5p)  Please contact [facility] for night-coverage after hours (5p -7a ) and weekends on [email or web address]   Agree with above.   Remains very tenuous. BP soft. Co-ox down and CVP up. Mildly SOB. No further bleeding. Scr improving   General:  Mildly dyspneic HEENT: normal Neck: supple. JVP to ear .  Cor: Reg mech s1 3/6 TR Lungs: crackles at bases Abdomen: soft, nontender, nondistended. No hepatosplenomegaly. No bruits or masses. Good bowel sounds. Extremities: no cyanosis, clubbing, rash, 1+ edema Neuro: alert & orientedx3, cranial nerves grossly intact. moves all 4 extremities w/o  difficulty. Affect pleasant  She is very tenuous with R>L  heart failure. I have d/w GI and CCM teams. I do not think she is ready for EGD due to decompensated R>L HF. Will place PA catheter and move to 2H to optimize further. Hopefully we can have her ready for EGD next week.   I would favor proceeding with EGD at some point given need for longstanding warfarin with mechanical MVR  Continue amio while on inotropes to help maintain SR as she does not tolerate AF well.   CRITICAL CARE Performed by: Arvilla Meres  Total critical care time: 40 minutes  Critical care time was exclusive of separately billable procedures and treating other patients.  Critical care was necessary to treat or prevent imminent or life-threatening deterioration.  Critical care was time spent personally by me (independent of midlevel providers or residents) on the following activities: development of treatment plan with patient and/or surrogate as well as nursing, discussions with consultants, evaluation of patient's response to treatment, examination of patient, obtaining history from patient or surrogate, ordering and performing treatments and interventions, ordering and review of laboratory studies, ordering and review of radiographic studies, pulse oximetry and re-evaluation of patient's condition.  Arvilla Meres, MD  9:35 AM

## 2023-08-20 NOTE — Interval H&P Note (Signed)
 History and Physical Interval Note:  08/20/2023 9:59 AM  Kim Anthony  has presented today for surgery, with the diagnosis of Heart Failure.  The various methods of treatment have been discussed with the patient and family. After consideration of risks, benefits and other options for treatment, the patient has consented to  Procedure(s): RIGHT HEART CATH (N/A) as a surgical intervention.  The patient's history has been reviewed, patient examined, no change in status, stable for surgery.  I have reviewed the patient's chart and labs.  Questions were answered to the patient's satisfaction.     Mariapaula Krist

## 2023-08-20 NOTE — Progress Notes (Signed)
 PHARMACY - ANTICOAGULATION CONSULT NOTE  Pharmacy Consult for heparin Indication:  Mechanical mitral valve replacement  Allergies  Allergen Reactions   Lisinopril Swelling   Acetaminophen-Codeine Itching   Propoxyphene Itching    Darvocet   Tape Rash    Plastic   Tramadol Itching, Nausea And Vomiting and Rash    Patient Measurements: Height: 5\' 5"  (165.1 cm) Weight: 51.2 kg (112 lb 14 oz) IBW/kg (Calculated) : 57 Heparin Dosing Weight: TBW  Vital Signs: Temp: 98.1 F (36.7 C) (02/28 0425) Temp Source: Oral (02/28 0425) BP: 102/66 (02/28 0212) Pulse Rate: 81 (02/28 0212)  Labs: Recent Labs    08/18/23 0022 08/18/23 0137 08/18/23 0234 08/18/23 0758 08/18/23 1624 08/18/23 1624 08/18/23 2132 08/19/23 0457 08/19/23 1216 08/19/23 1300 08/19/23 1526 08/19/23 1636 08/19/23 2338 08/20/23 0521  HGB  --  8.7* 8.4* 9.0* 8.4*  --  8.1* 8.2*  --   --  8.1*  --  7.3* 7.9*  HCT  --  29.3* 27.6* 29.4* 26.9*  --  25.9* 25.8*  --   --  25.7*  --  23.3* 25.6*  PLT  --  434* 409*  --  430*  --  406* 432*  --   --  443*  --  379 433*  APTT  --  170*  --   --   --   --  66*  --   --   --   --   --   --   --   LABPROT  --  >90.0*  --  19.1*  --   --   --  17.3*  --   --   --   --   --   --   INR  --  >10.0*  --  1.6*  --   --   --  1.4*  --   --   --   --   --   --   HEPARINUNFRC  --   --   --   --   --    < > <0.10* <0.10*  --   --   --  0.52  --  <0.10*  CREATININE  --  4.34* 4.27*  --   --   --   --  3.12* 2.81*  --   --   --   --   --   TROPONINIHS 121*  --   --   --  198*  --   --   --   --  121*  --   --   --   --    < > = values in this interval not displayed.    Estimated Creatinine Clearance: 14.4 mL/min (A) (by C-G formula based on SCr of 2.81 mg/dL (H)).   Medical History: Past Medical History:  Diagnosis Date   Arthritis    Asthma    Breast cancer (HCC) 2001   Left Breast Cancer   CHF (congestive heart failure) (HCC)    COPD (chronic obstructive pulmonary  disease) (HCC)    Coronary artery disease    GERD (gastroesophageal reflux disease)    History of mitral valve replacement with mechanical valve    HLD (hyperlipidemia)    Hypertension    Pulmonary embolism (HCC) 2021    Medications:  Medications Prior to Admission  Medication Sig Dispense Refill Last Dose/Taking   albuterol (VENTOLIN HFA) 108 (90 Base) MCG/ACT inhaler INHALE 2 PUFFS BY MOUTH EVERY 6 HOURS AS NEEDED FOR WHEEZING FOR  SHORTNESS OF BREATH 9 g 0 08/17/2023 Noon   FARXIGA 10 MG TABS tablet Take 1 tablet by mouth once daily 90 tablet 0 08/17/2023 Morning   fluticasone (FLONASE) 50 MCG/ACT nasal spray Place 2 sprays into both nostrils daily. (Patient taking differently: Place 1 spray into both nostrils daily.) 11.1 mL 3 08/17/2023 Morning   gabapentin (NEURONTIN) 800 MG tablet Take 1 tablet (800 mg total) by mouth 3 (three) times daily. 90 tablet 5 08/17/2023   HYDROcodone-acetaminophen (NORCO/VICODIN) 5-325 MG tablet Take 1 tablet by mouth every 4 (four) hours as needed for moderate pain (pain score 4-6). 150 tablet 0 08/17/2023 Morning   losartan (COZAAR) 25 MG tablet Take 0.5 tablets (12.5 mg total) by mouth daily. 45 tablet 3 08/17/2023 Morning   metoprolol succinate (TOPROL-XL) 25 MG 24 hr tablet Take 1 tablet (25 mg total) by mouth 2 (two) times daily. 60 tablet 2 08/17/2023 Morning   potassium chloride (KLOR-CON M) 10 MEQ tablet Take 6 tablets (60 mEq total) by mouth daily. (Patient taking differently: Take 60 mEq by mouth 2 (two) times daily. Take 3 tablets in the morning and 3 tablets in the afternoon) 120 tablet 6 08/17/2023 Noon   simvastatin (ZOCOR) 20 MG tablet Take 1 tablet (20 mg total) by mouth daily. 90 tablet 2 08/17/2023 Morning   tetrahydrozoline 0.05 % ophthalmic solution Place 1 drop into both eyes 3 (three) times daily.   08/17/2023   torsemide (DEMADEX) 100 MG tablet Take 1 tablet (100 mg total) by mouth daily. 90 tablet 3 08/17/2023 Morning   TRELEGY ELLIPTA 100-62.5-25  MCG/ACT AEPB INHALE 1 PUFF ONCE DAILY INTO  THE  LUNGS 60 each 0 08/17/2023 Morning   warfarin (COUMADIN) 5 MG tablet Take one-and-one-half (1 & 1/2) of your 5 mg peach colored warfarin tablets on Mondays and Thursdays. All other days, take only one (1) tablet. (Patient taking differently: Take 5 mg by mouth daily.) 32 tablet 2 08/15/2023   Scheduled:   Chlorhexidine Gluconate Cloth  6 each Topical Daily   fluticasone furoate-vilanterol  1 puff Inhalation Daily   And   umeclidinium bromide  1 puff Inhalation Daily   insulin aspart  0-15 Units Subcutaneous Q4H   pantoprazole (PROTONIX) IV  40 mg Intravenous Q12H   sodium chloride flush  10-40 mL Intracatheter Q12H    Assessment: RAKHI ROMAGNOLI is a 73 y.o.female with significant past medical history of  HFrEF [LVEF 35-40% 05/11/2022], mechanical mitral valve placement [2001] on chronic warfarin therapy [INR goal 2.5-3.5], paroxymal A-fib, CAD, HTN, HLD, hx breast cancer s/p left mastectomy and radiation/chemo, who presented to ED due to bloody stools and dizziness.   Pharmacy has been consulted to cautiously initiate heparin in setting of mechanical mitral valve replacement. Reportedly still having melenic stools. Discussed with CCM provider, Dr. Celine Mans and Dr. Sherrilee Gilles. Will proceed with IV heparin without bolus, and cautiously titrate to therapeutic level. Patient is hemodynamically stable, status post 2 u PRBC, with Hgb roughly stable at 9.4. Patient also with AKI, CrCl of 9.5 mL/min today.  EGD rescheduled to this morning @ ~9 am. Heparin held today at 0555 per RN. Discussed with Dr. Sherrilee Gilles. Heparin level undetectable this morning, collected at 0521, undetectable.   Goal of Therapy:  Heparin level 0.3-0.7 units/ml Monitor platelets by anticoagulation protocol: Yes   Plan:  Hold heparin pending EGD Follow up restart heparin and home warfarin Monitor closely for signs of worsening bleeding or clot  Lora Paula, PharmD PGY-2 Infectious Diseases  Pharmacy  [facility] 08/20/2023 6:14 AM  Contact: [phone] after 3 PM

## 2023-08-20 NOTE — Progress Notes (Addendum)
 Advanced Heart Failure Rounding Note  Cardiologist: Arvilla Meres, MD  Chief Complaint: RV Failure Subjective:    1.6L UOP. Overall net positive from yesterday. Received Lasix IV 80 mg once. Evening dose held for hypotension.  CVP 21 (personally obtained) SBP 70-80 overnight.  Feeling okay this morning. Tired. Having some pain in her shoulder. + Productive, cough. Mild SOB.   Objective:   Weight Range: 51.4 kg Body mass index is 18.86 kg/m.   Vital Signs:   Temp:  [98.1 F (36.7 C)-98.4 F (36.9 C)] 98.1 F (36.7 C) (02/28 0425) Pulse Rate:  [74-92] 76 (02/28 0602) Resp:  [13-31] 18 (02/28 0602) BP: (73-139)/(41-98) 93/45 (02/28 0602) SpO2:  [64 %-100 %] 98 % (02/28 0602) Weight:  [51.4 kg] 51.4 kg (02/28 0500) Last BM Date : 08/18/23  Weight change: Filed Weights   08/18/23 0338 08/18/23 0356 08/20/23 0500  Weight: 51.2 kg 51.2 kg 51.4 kg   Intake/Output:   Intake/Output Summary (Last 24 hours) at 08/20/2023 0714 Last data filed at 08/20/2023 0600 Gross per 24 hour  Intake 1576.34 ml  Output 1650 ml  Net -73.66 ml    CVP: 21  Physical Exam    General: Weak appearing. No distress on [place] Cardiac: JVP to jaw. S1 and S2 present. Mechanical click Abdomen: Taut, mildly distended.  Extremities: Warm and dry. 2+ BLE edema.  Neuro: Alert and oriented x3. Affect pleasant. Moves all extremities without difficulty. Lines/Devices:  RUE PICC  Telemetry   SR in 70s (personally reviewed)  EKG    N/A  Labs    CBC Recent Labs    08/17/23 2035 08/18/23 0137 08/18/23 0234 08/19/23 2338 08/20/23 0521  WBC 9.4 9.9   < > 10.8* 11.6*  NEUTROABS 5.1 5.6  --   --   --   HGB 9.4* 8.7*   < > 7.3* 7.9*  HCT 31.2* 29.3*   < > 23.3* 25.6*  MCV 92.9 93.3   < > 91.0 90.8  PLT 419* 434*   < > 379 433*   < > = values in this interval not displayed.   Basic Metabolic Panel Recent Labs    40/98/11 0234 08/19/23 0457 08/19/23 1216 08/19/23 1300 08/20/23 0521   NA 139   < > 139  --  137  K 4.2   < > 3.3*  --  3.7  CL 105   < > 106  --  104  CO2 17*   < > 21*  --  21*  GLUCOSE 64*   < > 98  --  115*  BUN 58*   < > 57*  --  51*  CREATININE 4.27*   < > 2.81*  --  2.17*  CALCIUM 6.8*   < > 7.3*  --  8.4*  MG 1.4*  --   --  2.1  --   PHOS 3.9  --   --   --   --    < > = values in this interval not displayed.   Liver Function Tests Recent Labs    08/19/23 0457 08/19/23 1300 08/20/23 0521  AST 47*  --  43*  ALT 18  --  17  ALKPHOS 120  --  124  BILITOT 2.1* 1.6* 1.4*  PROT 6.0*  --  6.2*  ALBUMIN 2.1*  --  2.1*   Recent Labs    08/18/23 0212  LIPASE 19  AMYLASE 73   Cardiac Enzymes No results for input(s): "CKTOTAL", "CKMB", "  CKMBINDEX", "TROPONINI" in the last 72 hours.  BNP: BNP (last 3 results) Recent Labs    01/19/23 1601 07/29/23 1347 08/17/23 2035  BNP 264.0* 374.8* 547.9*   ProBNP (last 3 results) No results for input(s): "PROBNP" in the last 8760 hours.  D-Dimer No results for input(s): "DDIMER" in the last 72 hours. Hemoglobin A1C Recent Labs    08/18/23 0212  HGBA1C 6.0*   Fasting Lipid Panel No results for input(s): "CHOL", "HDL", "LDLCALC", "TRIG", "CHOLHDL", "LDLDIRECT" in the last 72 hours. Thyroid Function Tests No results for input(s): "TSH", "T4TOTAL", "T3FREE", "THYROIDAB" in the last 72 hours.  Invalid input(s): "FREET3"  Other results:   Imaging   US Abdomen Complete Result Date: 08/19/2023 CLINICAL DATA:  Acute kidney injury.  Elevated bilirubin level. EXAM: ABDOMEN ULTRASOUND COMPLETE COMPARISON:  10/08/2022 renal ultrasound FINDINGS: Gallbladder: Suspect at least 1 dependent gallstone of 4 mm. No wall thickening or pericholecystic fluid. Sonographic Murphy's sign was not elicited. Common bile duct: Diameter: Mildly dilated for age at between 9 and 12 mm. No intrahepatic duct dilatation. Liver: No focal lesion identified. Within normal limits in parenchymal echogenicity. Portal vein is  patent on color Doppler imaging with normal direction of blood flow towards the liver. IVC: No abnormality visualized. Pancreas: Visualized portion unremarkable. Spleen: Size and appearance within normal limits. Right Kidney: Length: 11.6 cm. Echogenicity within normal limits. No mass or hydronephrosis visualized. Left Kidney: Length: 10.9 cm. Echogenicity within normal limits. No mass or hydronephrosis visualized. Abdominal aorta: No aneurysm visualized. Other findings: No ascites. Portions of exam are degraded by bowel gas. IMPRESSION: No explanation for acute renal insufficiency. Probable cholelithiasis without acute cholecystitis. Mild common duct dilatation for age. Given elevated bilirubin, consider MRCP to exclude distal obstruction or stone. Electronically Signed   By: Jeronimo Greaves M.D.   On: 08/19/2023 12:21    Medications:    Scheduled Medications:  Chlorhexidine Gluconate Cloth  6 each Topical Daily   fluticasone furoate-vilanterol  1 puff Inhalation Daily   And   umeclidinium bromide  1 puff Inhalation Daily   insulin aspart  0-15 Units Subcutaneous Q4H   pantoprazole (PROTONIX) IV  40 mg Intravenous Q12H   sodium chloride flush  10-40 mL Intracatheter Q12H    Infusions:  amiodarone 30 mg/hr (08/20/23 0600)   dextrose 20 mL/hr at 08/20/23 0600   furosemide     norepinephrine (LEVOPHED) Adult infusion      PRN Medications: docusate sodium, HYDROmorphone (DILAUDID) injection, ondansetron (ZOFRAN) IV, mouth rinse, polyethylene glycol, sodium chloride flush  Patient Profile   Kim Anthony is a 73 y.o. female with a history of mechanical MVR in 2001 in [place], [place], CAD, hx breast cancer s/p left mastectomy and radiation/chemo, tobacco use, COPD, prior PE/DVT, HTN, undergoing workup for possible inflammatory arthritis with Rheumatology, and systolic CHF.   Assessment/Plan   GIB - EGD clearance Hypotension Blood loss anemia - hypotension with melena requiring RBCs - echo with  evidence of RV strain and overload, would diurese to goal CVP 10 prior to EGD.  - No BM or melena overnight. - hgb 8.1>7.3>7.9  - weight up from yesterday. CVP 21 (personally obtained) will given Lasix 120 mg IV bid today. Evening lasix held for hypotension. - Start NE to assist with BP and CO - bleeding management per CCM   2. Acute on chronic RV Failure HFimpEF Pulmonary HTN  - Echo (2017): EF 45-50% in setting of PE. Has mild to moderate RV dysfunction. MV prosthesis okay.  -  Over the years has had EF recovery with RV dysfunction with relapses to 35-40% with RV dysfunction in the setting of AF RVR. - R/LHC (7/22): EF 35-40% with 1v CAD mRCA chronically occluded and well compensated hemodynamics with mild pulmonary HTN and normal CO - Suspect likely underlying OSA contributing to PH/RV failure. Also w/ known COPD. - Echo 2/25: EF 50%, mild LVH, RV severely reduced, normal MV prosthesis, moderate to severe TR. - Repeat yesterday with EF 55% and D-shaped septum, RV-RA gradient 25 mmHg and severe TR (does not coapt) - NYHA III-IV on admit - Co-ox 49>54%. Fick CO/CI 4.2/2.7.  - CVP 21 (goal <=10). Net positive from yesterday. Weight up. Evening dose Lasix held for hypotension. - Give Lasix 120 mg IV bid, would not hold - Start NE for BP support. - Hold GDMT with hypotension and bleeding - Will plan for RHC with leave-in [place] cath placement   3. AKI - in the setting of RV congestion - Cr very fluctuant over last year, baseline appears 1.1-2.1 - Cr 4.14 on admit, improving - 2.17 today - Diuresis as above - Avoid hypotension as above   4. PAF - She does not do well in AFL/AF. - s/p TEE and DCCV 9/23 to NSR. - On/Off PO amio for prolonged QTc - Recent admit 2/25 with AFL, converted to NSR on amio, but then stopped QTc 510 msec - In NSR on tele - Continue heparin gtt. - continue amio gtt   5. CAD  Elevated Troponin - 1 V CAD with occluded mid RCA on cath (7/22). - hsTrop  [id number]. Likely demand ischemia, decongest and follow - EKG without ST changes - No s/s angina. - on heparin - continue statin    6. Hx of mechanical mitral valve replacement: - Mechanical MVR in [place] in 2001. - Stable on echo 2/25 MV gradient 3 - On heparin with bleed - Continue SBE prophylaxis.   7. Tricuspid valve regurgitation - severe on echo 2/25 - Follow    8. Gout - has been taking NSAIDs and Prednisone at home.   Length of Stay: 2  CRITICAL CARE Performed by: [place] Lee  Total critical care time: 20 minutes  Critical care time was exclusive of separately billable procedures and treating other patients.  Critical care was necessary to treat or prevent imminent or life-threatening deterioration.  Critical care was time spent personally by me on the following activities: development of treatment plan with patient and/or surrogate as well as nursing, discussions with consultants, evaluation of patient's response to treatment, examination of patient, obtaining history from patient or surrogate, ordering and performing treatments and interventions, ordering and review of laboratory studies, ordering and review of radiographic studies, pulse oximetry and re-evaluation of patient's condition.  [place] Lee, NP  08/20/2023, 7:14 AM  Advanced Heart Failure Team Pager [phone] (M-F; 7a - 5p)  Please contact [facility] for night-coverage after hours (5p -7a ) and weekends on [email or web address]   Agree with above.   Remains very tenuous. BP soft. Co-ox down and CVP up. Mildly SOB. No further bleeding. Scr improving   General:  Mildly dyspneic HEENT: normal Neck: supple. JVP to ear .  Cor: Reg mech s1 3/6 TR Lungs: crackles at bases Abdomen: soft, nontender, nondistended. No hepatosplenomegaly. No bruits or masses. Good bowel sounds. Extremities: no cyanosis, clubbing, rash, 1+ edema Neuro: alert & orientedx3, cranial nerves grossly intact. moves all 4 extremities w/o  difficulty. Affect pleasant  She is very tenuous with R>L  heart failure. I have d/w GI and CCM teams. I do not think she is ready for EGD due to decompensated R>L HF. Will place PA catheter and move to 2H to optimize further. Hopefully we can have her ready for EGD next week.   I would favor proceeding with EGD at some point given need for longstanding warfarin with mechanical MVR  Continue amio while on inotropes to help maintain SR as she does not tolerate AF well.   CRITICAL CARE Performed by: Arvilla Meres  Total critical care time: 40 minutes  Critical care time was exclusive of separately billable procedures and treating other patients.  Critical care was necessary to treat or prevent imminent or life-threatening deterioration.  Critical care was time spent personally by me (independent of midlevel providers or residents) on the following activities: development of treatment plan with patient and/or surrogate as well as nursing, discussions with consultants, evaluation of patient's response to treatment, examination of patient, obtaining history from patient or surrogate, ordering and performing treatments and interventions, ordering and review of laboratory studies, ordering and review of radiographic studies, pulse oximetry and re-evaluation of patient's condition.  Arvilla Meres, MD  9:35 AM

## 2023-08-20 NOTE — Progress Notes (Addendum)
 NAME:  Kim Anthony, [name]:  [id number], DOB:  March 22, 1951, LOS: 2 ADMISSION DATE:  08/17/2023, CONSULTATION DATE:  08/18/23 REFERRING MD:  Dr. Lily Kocher, CHIEF COMPLAINT:  GI bleed   History of Present Illness:  73 year old female patient with multiple comorbidities as below, most prominently with mechanical mitral valve on chronic Coumadin therapy, A-fib present with lower GI bleed, bloody BMs, dizziness and weakness.   Of note patient has had previous episodes of melena, had colonoscopy in 2019 showing 20 mm polyp in internal hemorrhoids. Patient admits to recent NSAID use for past few weeks BID, has chronic pain, multiple complaints, critical care asked to see this patient due to symptomatic anemia and concern for worsening GI bleed.   Case is discussed with the hospitalist over the phone, is concerned about her IV access she currently has 2 peripheral IVs, we can continue to use this for blood products and IV fluids.  RN notified bedside by myself that if patient's IV access worsens and we will place a central line bedside.  Otherwise, patient can be admitted to the ICU for closer observation.  Pertinent  Medical History   has a past medical history of Arthritis, Asthma, Breast cancer (HCC) (2001), CHF (congestive heart failure) (HCC), COPD (chronic obstructive pulmonary disease) (HCC), Coronary artery disease, GERD (gastroesophageal reflux disease), History of mitral valve replacement with mechanical valve, HLD (hyperlipidemia), Hypertension, and Pulmonary embolism (HCC) (2021).   Significant Hospital Events: Including procedures, antibiotic start and stop dates in addition to other pertinent events   2/25 admitted to IMTS for hypotension and melena likely acute GI bleed, transfer to ICU due to limited IV access and close monitoring s/p 1u PRBC, GI consulted 2/26 PICC placed  2/27 cardiology consulted, coox 59% and CVP elevated    Interim History / Subjective:  Awake and alert. Denies any chest  pain or worsening SOB. Still has chronic back and left hip pain. No BM last night.   Objective   Blood pressure 102/66, pulse 81, temperature 98.1 F (36.7 C), temperature source Oral, resp. rate 19, height 5\' 5"  (1.651 m), weight 51.2 kg, SpO2 94%. CVP:  [15 mmHg-20 mmHg] 15 mmHg      Intake/Output Summary (Last 24 hours) at 08/20/2023 0612 Last data filed at 08/20/2023 0200 Gross per 24 hour  Intake 1489.72 ml  Output 1400 ml  Net 89.72 ml   Filed Weights   08/17/23 2012 08/18/23 0338 08/18/23 0356  Weight: 74.4 kg 51.2 kg 51.2 kg    Examination:  General: alert, laying in bed, in no acute distress HENT: AT/[place] Lungs: normal effort on 3L [place], clear to auscultation bilaterally  Cardiovascular: RRR, mechanical valve click heard  Abdomen: soft, mild diffuse tenderness, non-distended, no guarding or rebound Extremities: trace LE edema  Neuro: alert and oriented  Skin: no obvious rashes or new lesions of abdomen or left hip   Resolved Hospital Problem list     Assessment & Plan:  Shock c/f cardiogenic vs GIB Acute blood loss anemia on chronic anemia  Melena c/f acute UGIB Recent NSAID use Chronic warfarin use  -had melanotic stool 2/26, no further episodes charted  -Admit Hgb 9.4 s/p 1 u PRBC, steady around 7.9  (BL 10-11) -2 x PIV and PICC placed -PPI IV 40 mg BID -trend CBC Q12H -transfuse if Hgb <7  -started levophed, wean as tolerated -avoid NSAIDs  -on D20 at 42ml/hr x 1 day for intermittent hypoglycemia  -GI following, holding on EGD pending cardiology clearance  -  NPO since midnight, place to place PA catheter today per cards  Acute on chronic RV failure  HFimpEF (EF 55%) HTN -co-ox panel was O2 54%, CVP >10  -repeat echo showed RV overload, noted in previous of RV dysfunction  -consulted cards/advanced HF team, plan for transfer to 2H for PA catheter  -hold home antihypertensives due to hypotension   AKI on CKD 3a AGMA Lactic acidosis  -admit Scr 4.14 (BL  1-1.3 but has been labile in past), suspect recent daily NSAID use and shock  -lactic acid trend down s/p IVF  -Scr trending down  -trend BMP  -pending UA  -avoid nephrotoxic agents and renally dose meds  Elevated troponin  Demand ischemia Prolonged QT -troponin 113>>121>>198>>121, peaked/trend down -no ischemic changes seen on EKG -continues to deny chest pain, cardiology followimg  Hx of Afib  Hx of mechanical mitral valve on Warfarin  Prolonged QT -Initial EKG showed NSR  -INR on admit >10, s/p vitamin K and PCC -hold home metoprolol and warfarin -on heparin drip per pharmacy  -hold heparin drip prior to procedure today  Elevated bilirubin and LFTs LFT trending down from admission. Total bili 2.1. Noted prior elevations before. U/S abdomen showed no renal changes but noted cholelithiasis without cholecystitis. Mild dilation of CBD but no intrahepatic dilatation.  -suspect elevation in setting of biventricular HF  -direct bili mildly elevated to 0.8 -total bili trended down to 1.4 and LFTs close to normal  -trend CMP     COPD -continue hospital formulary Trelegy inhaler   Chronic back pain Postherpetic neuralgia Chronic opiate use Gout -was taking BID NSAID for past few weeks  -hold home Norco/Vicodin 5-325mg  (Takes 6 tablets/day) given NPO -hold home gabapentin given AKI, restart as appropriate  -IV dialudid 0.5 mg Q4H PRN    Best Practice (right click and "Reselect all SmartList Selections" daily)   Diet/type: NPO DVT prophylaxis systemic heparin Pressure ulcer(s): N/A GI prophylaxis: PPI Lines: PICC Foley:  N/A Code Status:  full code Last date of multidisciplinary goals of care discussion [ updated daughter at bedside 2/28 ]  Labs   CBC: Recent Labs  Lab 08/17/23 2035 08/18/23 0137 08/18/23 0234 08/18/23 2132 08/19/23 0457 08/19/23 1526 08/19/23 2338 08/20/23 0521  WBC 9.4 9.9   < > 11.6* 11.9* 11.8* 10.8* 11.6*  NEUTROABS 5.1 5.6  --   --   --    --   --   --   HGB 9.4* 8.7*   < > 8.1* 8.2* 8.1* 7.3* 7.9*  HCT 31.2* 29.3*   < > 25.9* 25.8* 25.7* 23.3* 25.6*  MCV 92.9 93.3   < > 91.8 90.5 90.8 91.0 90.8  PLT 419* 434*   < > 406* 432* 443* 379 433*   < > = values in this interval not displayed.    Basic Metabolic Panel: Recent Labs  Lab 08/17/23 2035 08/18/23 0137 08/18/23 0234 08/19/23 0457 08/19/23 1216 08/19/23 1300  NA 137 139 139 139 139  --   K 4.1 4.6 4.2 3.3* 3.3*  --   CL 101 106 105 102 106  --   CO2 19* 19* 17* 19* 21*  --   GLUCOSE 61* 66* 64* 92 98  --   BUN 52* 58* 58* 59* 57*  --   CREATININE 4.14* 4.34* 4.27* 3.12* 2.81*  --   CALCIUM 7.0* 6.6* 6.8* 7.5* 7.3*  --   MG  --  1.5* 1.4*  --   --  2.1  PHOS  --   --  3.9  --   --   --    GFR: Estimated Creatinine Clearance: 14.4 mL/min (A) (by C-G formula based on SCr of 2.81 mg/dL (H)). Recent Labs  Lab 08/18/23 0026 08/18/23 0137 08/18/23 0251 08/18/23 1624 08/19/23 0457 08/19/23 1526 08/19/23 2338 08/20/23 0521  WBC  --    < >  --    < > 11.9* 11.8* 10.8* 11.6*  LATICACIDVEN 2.4*  --  2.0*  --   --   --   --   --    < > = values in this interval not displayed.    Liver Function Tests: Recent Labs  Lab 08/17/23 2035 08/18/23 0137 08/19/23 0457 08/19/23 1300  AST 56* 55* 47*  --   ALT 18 17 18   --   ALKPHOS 140* 132* 120  --   BILITOT 1.5* 1.3* 2.1* 1.6*  PROT 6.7 6.0* 6.0*  --   ALBUMIN 2.4* 2.2* 2.1*  --    Recent Labs  Lab 08/18/23 0212  LIPASE 19  AMYLASE 73   No results for input(s): "AMMONIA" in the last 168 hours.  ABG    Component Value Date/Time   PHART 7.345 (L) 01/09/2021 1341   PCO2ART 44.2 01/09/2021 1341   PO2ART 78 (L) 01/09/2021 1341   HCO3 21.2 08/18/2023 0758   TCO2 33 (H) 11/17/2022 0709   ACIDBASEDEF 5.1 (H) 08/18/2023 0758   O2SAT 54.4 08/20/2023 0521     Coagulation Profile: Recent Labs  Lab 08/18/23 0137 08/18/23 0758 08/19/23 0457  INR >10.0* 1.6* 1.4*    Cardiac Enzymes: No results  for input(s): "CKTOTAL", "CKMB", "CKMBINDEX", "TROPONINI" in the last 168 hours.  HbA1C: Hgb A1c MFr Bld  Date/Time Value Ref Range Status  08/18/2023 02:12 AM 6.0 (H) 4.8 - 5.6 % Final    Comment:    (NOTE) Pre diabetes:          5.7%-6.4%  Diabetes:              >6.4%  Glycemic control for   <7.0% adults with diabetes   09/21/2022 10:24 AM 5.5 4.8 - 5.6 % Final    Comment:    (NOTE)         Prediabetes: 5.7 - 6.4         Diabetes: >6.4         Glycemic control for adults with diabetes: <7.0     CBG: Recent Labs  Lab 08/19/23 1214 08/19/23 1634 08/19/23 1952 08/20/23 0015 08/20/23 0422  GLUCAP 86 108* 152* 89 102*    Review of Systems:   Endorses melena, abdominal pain, chronic hip and back pain  Denies chest pain, n/v   Past Medical History:  She,  has a past medical history of Arthritis, Asthma, Breast cancer (HCC) (2001), CHF (congestive heart failure) (HCC), COPD (chronic obstructive pulmonary disease) (HCC), Coronary artery disease, GERD (gastroesophageal reflux disease), History of mitral valve replacement with mechanical valve, HLD (hyperlipidemia), Hypertension, and Pulmonary embolism (HCC) (2021).   Surgical History:   Past Surgical History:  Procedure Laterality Date   APPLICATION OF A-CELL OF CHEST/ABDOMEN Left 01/27/2019   Procedure: APPLICATION OF A-CELL OF CHEST;  Surgeon: Peggye Form, DO;  Location: [facility] OR;  Service: Plastics;  Laterality: Left;   APPLICATION OF WOUND VAC N/A 01/27/2019   Procedure: APPLICATION OF WOUND VAC;  Surgeon: Peggye Form, DO;  Location: [facility] OR;  Service: Plastics;  Laterality: N/A;   BREAST SURGERY Left 2001   for CA  CARDIOVERSION N/A 02/26/2022   Procedure: CARDIOVERSION;  Surgeon: Little Ishikawa, MD;  Location: [facility] ENDOSCOPY;  Service: Cardiovascular;  Laterality: N/A;   COLONOSCOPY WITH PROPOFOL N/A 06/07/2018   Procedure: COLONOSCOPY WITH PROPOFOL;  Surgeon: Charlott Rakes, MD;  Location:  [facility] ENDOSCOPY;  Service: Endoscopy;  Laterality: N/A;   DEBRIDEMENT AND CLOSURE WOUND Left 12/28/2018   Procedure: Debridement And Closure Wound;  Surgeon: Abigail Miyamoto, MD;  Location: [facility] OR;  Service: General;  Laterality: Left;   INCISION AND DRAINAGE OF WOUND Left 01/27/2019   Procedure: IRRIGATION AND DEBRIDEMENT OF CHEST WALL;  Surgeon: Peggye Form, DO;  Location: [facility] OR;  Service: Plastics;  Laterality: Left;   INTRAMEDULLARY (IM) NAIL INTERTROCHANTERIC N/A 09/23/2022   Procedure: INTRAMEDULLARY (IM) NAIL INTERTROCHANTERIC;  Surgeon: Tarry Kos, MD;  Location: [facility] OR;  Service: Orthopedics;  Laterality: N/A;   KNEE ARTHROSCOPY WITH MEDIAL MENISECTOMY Left 08/06/2021   Procedure: KNEE ARTHROSCOPY WITH PARTIAL MEDIAL MENISECTOMY, DEBRIDEMENT;  Surgeon: Jene Every, MD;  Location: [facility] ORS;  Service: Orthopedics;  Laterality: Left;  1 HR   LATISSIMUS FLAP TO BREAST Left 01/18/2019   Procedure: LEFT LATISSIMUS FLAP TO BREAST;  Surgeon: Peggye Form, DO;  Location: [facility] OR;  Service: Plastics;  Laterality: Left;   MASTECTOMY Left    MITRAL VALVE REPLACEMENT     mechanical   POLYPECTOMY  06/07/2018   Procedure: POLYPECTOMY;  Surgeon: Charlott Rakes, MD;  Location: [facility] ENDOSCOPY;  Service: Endoscopy;;   RIGHT/LEFT HEART CATH AND CORONARY ANGIOGRAPHY N/A 01/09/2021   Procedure: RIGHT/LEFT HEART CATH AND CORONARY ANGIOGRAPHY;  Surgeon: Dolores Patty, MD;  Location: [facility] INVASIVE CV LAB;  Service: Cardiovascular;  Laterality: N/A;   WOUND DEBRIDEMENT Left 12/28/2018   Procedure: EXCISION OF LEFT CHRONIC CHEST WALL WOUND;  Surgeon: Abigail Miyamoto, MD;  Location: [facility] OR;  Service: General;  Laterality: Left;     Social History:   reports that she has been smoking cigarettes. She has a 3 pack-year smoking history. She has never used smokeless tobacco. She reports current alcohol use of about 3.0 standard drinks of alcohol per week. She reports that she does not use drugs.    Family History:  Her family history includes Breast cancer (age of onset: 53) in her maternal aunt; Cancer in her father; Heart attack in an other family member; Hypertension in her father and mother.   Allergies Allergies  Allergen Reactions   Lisinopril Swelling   Acetaminophen-Codeine Itching   Propoxyphene Itching    Darvocet   Tape Rash    Plastic   Tramadol Itching, Nausea And Vomiting and Rash     Home Medications  Prior to Admission medications   Medication Sig Start Date End Date Taking? Authorizing Provider  albuterol (VENTOLIN HFA) 108 (90 Base) MCG/ACT inhaler INHALE 2 PUFFS BY MOUTH EVERY 6 HOURS AS NEEDED FOR WHEEZING FOR SHORTNESS OF BREATH 07/15/23  Yes Kathleen Lime, MD  FARXIGA 10 MG TABS tablet Take 1 tablet by mouth once daily 06/03/23  Yes Kathleen Lime, MD  fluticasone (FLONASE) 50 MCG/ACT nasal spray Place 2 sprays into both nostrils daily. Patient taking differently: Place 1 spray into both nostrils daily. 05/17/23 05/16/24 Yes Kathleen Lime, MD  gabapentin (NEURONTIN) 800 MG tablet Take 1 tablet (800 mg total) by mouth 3 (three) times daily. 03/01/23  Yes Lovorn, Aundra Millet, MD  HYDROcodone-acetaminophen (NORCO/VICODIN) 5-325 MG tablet Take 1 tablet by mouth every 4 (four) hours as needed for moderate pain (pain score 4-6). 08/05/23  Yes Lovorn,  Aundra Millet, MD  losartan (COZAAR) 25 MG tablet Take 0.5 tablets (12.5 mg total) by mouth daily. 08/10/23 11/08/23 Yes Milford, Anderson Malta, FNP  metoprolol succinate (TOPROL-XL) 25 MG 24 hr tablet Take 1 tablet (25 mg total) by mouth 2 (two) times daily. 08/01/23  Yes Monna Fam, MD  potassium chloride (KLOR-CON M) 10 MEQ tablet Take 6 tablets (60 mEq total) by mouth daily. Patient taking differently: Take 60 mEq by mouth 2 (two) times daily. Take 3 tablets in the morning and 3 tablets in the afternoon 08/10/23 03/07/24 Yes Milford, Anderson Malta, FNP  simvastatin (ZOCOR) 20 MG tablet Take 1 tablet (20 mg total) by mouth daily. 12/03/22   Yes Willette Cluster, MD  tetrahydrozoline 0.05 % ophthalmic solution Place 1 drop into both eyes 3 (three) times daily.   Yes [provider]  torsemide (DEMADEX) 100 MG tablet Take 1 tablet (100 mg total) by mouth daily. 08/10/23  Yes Milford, Prichard, FNP  TRELEGY ELLIPTA 100-62.5-25 MCG/ACT AEPB INHALE 1 PUFF ONCE DAILY INTO  THE  LUNGS 07/15/23  Yes Kathleen Lime, MD  warfarin (COUMADIN) 5 MG tablet Take one-and-one-half (1 & 1/2) of your 5 mg peach colored warfarin tablets on Mondays and Thursdays. All other days, take only one (1) tablet. Patient taking differently: Take 5 mg by mouth daily. 04/26/23  Yes Elicia Lamp, RPH-CPP     Critical care time:      Signature: Rana Snare, DO Internal Medicine Resident PGY-2

## 2023-08-20 NOTE — Progress Notes (Signed)
 eLink Physician-Brief Progress Note Patient Name: Kim Anthony DOB: 10/17/1950 [name]: [id number]   Date of Service  08/20/2023  HPI/Events of Note    eICU Interventions     Anxiety Will try low dose Klonopin     Detron Carras 08/20/2023, 8:15 PM

## 2023-08-20 NOTE — Progress Notes (Signed)
 PHARMACY - ANTICOAGULATION CONSULT NOTE  Pharmacy Consult for heparin Indication:  Mechanical mitral valve replacement  Allergies  Allergen Reactions   Lisinopril Swelling   Acetaminophen-Codeine Itching   Propoxyphene Itching    Darvocet   Tape Rash    Plastic   Tramadol Itching, Nausea And Vomiting and Rash    Patient Measurements: Height: 5\' 5"  (165.1 cm) Weight: 51.4 kg (113 lb 5.1 oz) IBW/kg (Calculated) : 57 Heparin Dosing Weight: TBW  Vital Signs: Temp: 98.2 F (36.8 C) (02/28 1500) BP: 87/65 (02/28 1500) Pulse Rate: 76 (02/28 1500)  Labs: Recent Labs    08/18/23 0022 08/18/23 0137 08/18/23 0234 08/18/23 0758 08/18/23 1624 08/18/23 1624 08/18/23 2132 08/19/23 0457 08/19/23 1216 08/19/23 1300 08/19/23 1526 08/19/23 1636 08/19/23 2338 08/20/23 0521 08/20/23 1017  HGB  --  8.7*   < > 9.0* 8.4*  --  8.1* 8.2*  --   --  8.1*  --  7.3* 7.9* 9.2*  HCT  --  29.3*   < > 29.4* 26.9*  --  25.9* 25.8*  --   --  25.7*  --  23.3* 25.6* 27.0*  PLT  --  434*   < >  --  430*  --  406* 432*  --   --  443*  --  379 433*  --   APTT  --  170*  --   --   --   --  66*  --   --   --   --   --   --   --   --   LABPROT  --  >90.0*  --  19.1*  --   --   --  17.3*  --   --   --   --   --   --   --   INR  --  >10.0*  --  1.6*  --   --   --  1.4*  --   --   --   --   --   --   --   HEPARINUNFRC  --   --   --   --   --    < > <0.10* <0.10*  --   --   --  0.52  --  <0.10*  --   CREATININE  --  4.34*   < >  --   --   --   --  3.12* 2.81*  --   --   --   --  2.17*  --   TROPONINIHS 121*  --   --   --  198*  --   --   --   --  121*  --   --   --   --   --    < > = values in this interval not displayed.    Estimated Creatinine Clearance: 18.7 mL/min (A) (by C-G formula based on SCr of 2.17 mg/dL (H)).   Medical History: Past Medical History:  Diagnosis Date   Arthritis    Asthma    Breast cancer (HCC) 2001   Left Breast Cancer   CHF (congestive heart failure) (HCC)    COPD  (chronic obstructive pulmonary disease) (HCC)    Coronary artery disease    GERD (gastroesophageal reflux disease)    History of mitral valve replacement with mechanical valve    HLD (hyperlipidemia)    Hypertension    Pulmonary embolism (HCC) 2021    Medications:  Medications Prior to Admission  Medication Sig Dispense Refill Last Dose/Taking   albuterol (VENTOLIN HFA) 108 (90 Base) MCG/ACT inhaler INHALE 2 PUFFS BY MOUTH EVERY 6 HOURS AS NEEDED FOR WHEEZING FOR SHORTNESS OF BREATH 9 g 0 08/17/2023 Noon   FARXIGA 10 MG TABS tablet Take 1 tablet by mouth once daily 90 tablet 0 08/17/2023 Morning   fluticasone (FLONASE) 50 MCG/ACT nasal spray Place 2 sprays into both nostrils daily. (Patient taking differently: Place 1 spray into both nostrils daily.) 11.1 mL 3 08/17/2023 Morning   gabapentin (NEURONTIN) 800 MG tablet Take 1 tablet (800 mg total) by mouth 3 (three) times daily. 90 tablet 5 08/17/2023   HYDROcodone-acetaminophen (NORCO/VICODIN) 5-325 MG tablet Take 1 tablet by mouth every 4 (four) hours as needed for moderate pain (pain score 4-6). 150 tablet 0 08/17/2023 Morning   losartan (COZAAR) 25 MG tablet Take 0.5 tablets (12.5 mg total) by mouth daily. 45 tablet 3 08/17/2023 Morning   metoprolol succinate (TOPROL-XL) 25 MG 24 hr tablet Take 1 tablet (25 mg total) by mouth 2 (two) times daily. 60 tablet 2 08/17/2023 Morning   potassium chloride (KLOR-CON M) 10 MEQ tablet Take 6 tablets (60 mEq total) by mouth daily. (Patient taking differently: Take 60 mEq by mouth 2 (two) times daily. Take 3 tablets in the morning and 3 tablets in the afternoon) 120 tablet 6 08/17/2023 Noon   simvastatin (ZOCOR) 20 MG tablet Take 1 tablet (20 mg total) by mouth daily. 90 tablet 2 08/17/2023 Morning   tetrahydrozoline 0.05 % ophthalmic solution Place 1 drop into both eyes 3 (three) times daily.   08/17/2023   torsemide (DEMADEX) 100 MG tablet Take 1 tablet (100 mg total) by mouth daily. 90 tablet 3 08/17/2023 Morning    TRELEGY ELLIPTA 100-62.5-25 MCG/ACT AEPB INHALE 1 PUFF ONCE DAILY INTO  THE  LUNGS 60 each 0 08/17/2023 Morning   warfarin (COUMADIN) 5 MG tablet Take one-and-one-half (1 & 1/2) of your 5 mg peach colored warfarin tablets on Mondays and Thursdays. All other days, take only one (1) tablet. (Patient taking differently: Take 5 mg by mouth daily.) 32 tablet 2 08/15/2023   Scheduled:   Chlorhexidine Gluconate Cloth  6 each Topical Daily   fluticasone furoate-vilanterol  1 puff Inhalation Daily   And   umeclidinium bromide  1 puff Inhalation Daily   insulin aspart  0-15 Units Subcutaneous Q4H   pantoprazole (PROTONIX) IV  40 mg Intravenous Q12H   sodium chloride flush  10-40 mL Intracatheter Q12H   sodium chloride flush  3 mL Intravenous Q12H    Assessment: Kim Anthony is a 73 y.o.female with significant past medical history of  HFrEF [LVEF 35-40% 05/11/2022], mechanical mitral valve placement [2001] on chronic warfarin therapy [INR goal 2.5-3.5], paroxymal A-fib, CAD, HTN, HLD, hx breast cancer s/p left mastectomy and radiation/chemo, who presented to ED due to bloody stools and dizziness.   Pharmacy has been consulted to cautiously initiate heparin in setting of mechanical mitral valve replacement. Reportedly still having melenic stools. Discussed with CCM provider, Dr. Celine Mans and Dr. Sherrilee Gilles. Will proceed with IV heparin without bolus, and cautiously titrate to therapeutic level. Patient is hemodynamically stable, status post 2 u PRBC, with Hgb roughly stable at 9.4. Patient also with AKI, CrCl of 9.5 mL/min today.  S/p RHC 2/28  - EGD on hold  Resume heparin drip 900 uts/hr   Goal of Therapy:  Heparin level 0.3-0.5 - try to keep on lower end of goal Monitor platelets by anticoagulation protocol: Yes  Plan:  Heparin drip 900 uts/hr  Draw heparin level 6hr after restart Daily heparin level and CBC   Leota Sauers Pharm.D. CPP, BCPS Clinical Pharmacist [phone] 08/20/2023 4:27  PM    Contact: [phone] after 3 PM

## 2023-08-20 NOTE — Progress Notes (Signed)
 IVT consult placed for PIV access to infuse heparin. This RN to bedside, pt with R TL PICC line and LUE restriction d/t mastectomy on that side. Primary RN OFT at the moment, discussed restrictions with covering RN.

## 2023-08-20 NOTE — Progress Notes (Signed)
 Case discussed with Dr. Gala Romney.    While we would like to pursue endoscopy to evaluate her melena, especially given her mechanical MVR with need for anticoagulation, she is not yet stable for her endoscopy yet from cardiac/heart failure perspective.  No reported further bleeding.    For now, unless recurrent bleeding, ok to advance diet as tolerated and stay on PPI.  [facility] will revisit Sunday, at which time we can better assess optimal timing for inpatient endoscopy.

## 2023-08-21 DIAGNOSIS — N1831 Chronic kidney disease, stage 3a: Secondary | ICD-10-CM

## 2023-08-21 DIAGNOSIS — E876 Hypokalemia: Secondary | ICD-10-CM

## 2023-08-21 DIAGNOSIS — R570 Cardiogenic shock: Secondary | ICD-10-CM | POA: Diagnosis not present

## 2023-08-21 DIAGNOSIS — K922 Gastrointestinal hemorrhage, unspecified: Secondary | ICD-10-CM | POA: Diagnosis not present

## 2023-08-21 DIAGNOSIS — N179 Acute kidney failure, unspecified: Secondary | ICD-10-CM | POA: Diagnosis not present

## 2023-08-21 DIAGNOSIS — E871 Hypo-osmolality and hyponatremia: Secondary | ICD-10-CM

## 2023-08-21 DIAGNOSIS — I50814 Right heart failure due to left heart failure: Secondary | ICD-10-CM

## 2023-08-21 DIAGNOSIS — I272 Pulmonary hypertension, unspecified: Secondary | ICD-10-CM | POA: Diagnosis not present

## 2023-08-21 DIAGNOSIS — J449 Chronic obstructive pulmonary disease, unspecified: Secondary | ICD-10-CM

## 2023-08-21 LAB — HEPARIN LEVEL (UNFRACTIONATED)
Heparin Unfractionated: 0.13 [IU]/mL — ABNORMAL LOW (ref 0.30–0.70)
Heparin Unfractionated: <0.1 [IU]/mL — ABNORMAL LOW (ref 0.30–0.70)
Heparin Unfractionated: <0.1 [IU]/mL — ABNORMAL LOW (ref 0.30–0.70)

## 2023-08-21 LAB — GLUCOSE, CAPILLARY
Glucose-Capillary: 108 mg/dL — ABNORMAL HIGH (ref 70–99)
Glucose-Capillary: 119 mg/dL — ABNORMAL HIGH (ref 70–99)
Glucose-Capillary: 120 mg/dL — ABNORMAL HIGH (ref 70–99)
Glucose-Capillary: 125 mg/dL — ABNORMAL HIGH (ref 70–99)
Glucose-Capillary: 133 mg/dL — ABNORMAL HIGH (ref 70–99)
Glucose-Capillary: 154 mg/dL — ABNORMAL HIGH (ref 70–99)
Glucose-Capillary: 98 mg/dL (ref 70–99)

## 2023-08-21 LAB — COMPREHENSIVE METABOLIC PANEL
ALT: 15 U/L (ref 0–44)
AST: 32 U/L (ref 15–41)
Albumin: 2 g/dL — ABNORMAL LOW (ref 3.5–5.0)
Alkaline Phosphatase: 123 U/L (ref 38–126)
Anion gap: 10 (ref 5–15)
BUN: 37 mg/dL — ABNORMAL HIGH (ref 8–23)
CO2: 22 mmol/L (ref 22–32)
Calcium: 8.1 mg/dL — ABNORMAL LOW (ref 8.9–10.3)
Chloride: 98 mmol/L (ref 98–111)
Creatinine, Ser: 1.47 mg/dL — ABNORMAL HIGH (ref 0.44–1.00)
GFR, Estimated: 37 mL/min — ABNORMAL LOW (ref >60)
Glucose, Bld: 185 mg/dL — ABNORMAL HIGH (ref 70–99)
Potassium: 2.8 mmol/L — ABNORMAL LOW (ref 3.5–5.1)
Sodium: 130 mmol/L — ABNORMAL LOW (ref 135–145)
Total Bilirubin: 1.4 mg/dL — ABNORMAL HIGH (ref 0.0–1.2)
Total Protein: 5.7 g/dL — ABNORMAL LOW (ref 6.5–8.1)

## 2023-08-21 LAB — CBC
HCT: 24.3 % — ABNORMAL LOW (ref 36.0–46.0)
HCT: 25.4 % — ABNORMAL LOW (ref 36.0–46.0)
Hemoglobin: 7.8 g/dL — ABNORMAL LOW (ref 12.0–15.0)
Hemoglobin: 7.9 g/dL — ABNORMAL LOW (ref 12.0–15.0)
MCH: 29.7 pg (ref 26.0–34.0)
MCH: 28.4 pg (ref 26.0–34.0)
MCHC: 32.1 g/dL (ref 30.0–36.0)
MCHC: 31.1 g/dL (ref 30.0–36.0)
MCV: 92.4 fL (ref 80.0–100.0)
MCV: 91.4 fL (ref 80.0–100.0)
Platelets: 430 10*3/uL — ABNORMAL HIGH (ref 150–400)
Platelets: 433 10*3/uL — ABNORMAL HIGH (ref 150–400)
RBC: 2.63 MIL/uL — ABNORMAL LOW (ref 3.87–5.11)
RBC: 2.78 MIL/uL — ABNORMAL LOW (ref 3.87–5.11)
RDW: 17.1 % — ABNORMAL HIGH (ref 11.5–15.5)
RDW: 16.7 % — ABNORMAL HIGH (ref 11.5–15.5)
WBC: 11.5 10*3/uL — ABNORMAL HIGH (ref 4.0–10.5)
WBC: 10.7 10*3/uL — ABNORMAL HIGH (ref 4.0–10.5)
nRBC: 0.9 % — ABNORMAL HIGH (ref 0.0–0.2)
nRBC: 0.7 % — ABNORMAL HIGH (ref 0.0–0.2)

## 2023-08-21 LAB — CBC WITH DIFFERENTIAL/PLATELET
Abs Immature Granulocytes: 0.17 10*3/uL — ABNORMAL HIGH (ref 0.00–0.07)
Basophils Absolute: 0.1 10*3/uL (ref 0.0–0.1)
Basophils Relative: 1 %
Eosinophils Absolute: 0.4 10*3/uL (ref 0.0–0.5)
Eosinophils Relative: 4 %
HCT: 25.6 % — ABNORMAL LOW (ref 36.0–46.0)
Hemoglobin: 8.1 g/dL — ABNORMAL LOW (ref 12.0–15.0)
Immature Granulocytes: 2 %
Lymphocytes Relative: 18 %
Lymphs Abs: 1.9 10*3/uL (ref 0.7–4.0)
MCH: 28.4 pg (ref 26.0–34.0)
MCHC: 31.6 g/dL (ref 30.0–36.0)
MCV: 89.8 fL (ref 80.0–100.0)
Monocytes Absolute: 1.5 10*3/uL — ABNORMAL HIGH (ref 0.1–1.0)
Monocytes Relative: 14 %
Neutro Abs: 6.5 10*3/uL (ref 1.7–7.7)
Neutrophils Relative %: 61 %
Platelets: 454 10*3/uL — ABNORMAL HIGH (ref 150–400)
RBC: 2.85 MIL/uL — ABNORMAL LOW (ref 3.87–5.11)
RDW: 16.7 % — ABNORMAL HIGH (ref 11.5–15.5)
WBC: 10.5 10*3/uL (ref 4.0–10.5)
nRBC: 1 % — ABNORMAL HIGH (ref 0.0–0.2)

## 2023-08-21 LAB — BASIC METABOLIC PANEL
Anion gap: 13 (ref 5–15)
BUN: 31 mg/dL — ABNORMAL HIGH (ref 8–23)
CO2: 23 mmol/L (ref 22–32)
Calcium: 8.7 mg/dL — ABNORMAL LOW (ref 8.9–10.3)
Chloride: 98 mmol/L (ref 98–111)
Creatinine, Ser: 1.18 mg/dL — ABNORMAL HIGH (ref 0.44–1.00)
GFR, Estimated: 49 mL/min — ABNORMAL LOW (ref >60)
Glucose, Bld: 153 mg/dL — ABNORMAL HIGH (ref 70–99)
Potassium: 3.3 mmol/L — ABNORMAL LOW (ref 3.5–5.1)
Sodium: 134 mmol/L — ABNORMAL LOW (ref 135–145)

## 2023-08-21 LAB — COOXEMETRY PANEL
Carboxyhemoglobin: 2.8 % — ABNORMAL HIGH (ref 0.5–1.5)
Methemoglobin: <0.7 % (ref 0.0–1.5)
O2 Saturation: 55.6 %
Total hemoglobin: 8 g/dL — ABNORMAL LOW (ref 12.0–16.0)

## 2023-08-21 LAB — MAGNESIUM
Magnesium: 1.3 mg/dL — ABNORMAL LOW (ref 1.7–2.4)
Magnesium: 2 mg/dL (ref 1.7–2.4)

## 2023-08-21 MED ORDER — MAGNESIUM SULFATE 2 GM/50ML IV SOLN
2.0000 g | Freq: Once | INTRAVENOUS | Status: AC
  Administered 2023-08-21: 2 g via INTRAVENOUS

## 2023-08-21 MED ORDER — POTASSIUM CHLORIDE 10 MEQ/50ML IV SOLN
10.0000 meq | INTRAVENOUS | Status: AC
  Administered 2023-08-21 (×2): 10 meq via INTRAVENOUS
  Filled 2023-08-21 (×2): qty 50

## 2023-08-21 MED ORDER — POTASSIUM CHLORIDE 20 MEQ PO PACK
40.0000 meq | PACK | ORAL | Status: AC
  Administered 2023-08-21: 40 meq via ORAL
  Filled 2023-08-21: qty 2

## 2023-08-21 MED ORDER — POTASSIUM CHLORIDE CRYS ER 20 MEQ PO TBCR
40.0000 meq | EXTENDED_RELEASE_TABLET | Freq: Once | ORAL | Status: AC
  Administered 2023-08-21: 40 meq via ORAL
  Filled 2023-08-21: qty 2

## 2023-08-21 MED ORDER — CLONAZEPAM 0.5 MG PO TABS
0.5000 mg | ORAL_TABLET | Freq: Once | ORAL | Status: AC
  Administered 2023-08-21: 0.5 mg via ORAL
  Filled 2023-08-21: qty 1

## 2023-08-21 MED ORDER — FUROSEMIDE 10 MG/ML IJ SOLN
120.0000 mg | Freq: Two times a day (BID) | INTRAVENOUS | Status: AC
  Administered 2023-08-21 (×2): 120 mg via INTRAVENOUS
  Filled 2023-08-21 (×2): qty 10

## 2023-08-21 MED ORDER — POTASSIUM CHLORIDE 20 MEQ PO PACK
40.0000 meq | PACK | ORAL | Status: DC
  Administered 2023-08-21: 40 meq
  Filled 2023-08-21: qty 2

## 2023-08-21 MED ORDER — HYDROMORPHONE HCL 2 MG PO TABS
1.0000 mg | ORAL_TABLET | ORAL | Status: DC | PRN
  Administered 2023-08-21 – 2023-08-28 (×27): 1 mg via ORAL
  Filled 2023-08-21 (×28): qty 1

## 2023-08-21 MED ORDER — POTASSIUM CHLORIDE 10 MEQ/50ML IV SOLN
10.0000 meq | INTRAVENOUS | Status: AC
  Administered 2023-08-21 (×4): 10 meq via INTRAVENOUS
  Filled 2023-08-21 (×3): qty 50

## 2023-08-21 MED ORDER — MAGNESIUM SULFATE 4 GM/100ML IV SOLN
4.0000 g | Freq: Once | INTRAVENOUS | Status: AC
  Administered 2023-08-21: 4 g via INTRAVENOUS
  Filled 2023-08-21: qty 100

## 2023-08-21 MED ORDER — SPIRONOLACTONE 25 MG PO TABS
25.0000 mg | ORAL_TABLET | Freq: Every day | ORAL | Status: DC
  Administered 2023-08-21 – 2023-08-23 (×3): 25 mg via ORAL
  Filled 2023-08-21 (×3): qty 1

## 2023-08-21 MED ORDER — MAGNESIUM SULFATE 2 GM/50ML IV SOLN
2.0000 g | Freq: Once | INTRAVENOUS | Status: DC
  Filled 2023-08-21: qty 50

## 2023-08-21 NOTE — Progress Notes (Signed)
 eLink Physician-Brief Progress Note Patient Name: Kim Anthony DOB: March 04, 1951 [name]: [id number]   Date of Service  08/21/2023  HPI/Events of Note  Patient anxious and did well with 0.5 mg Clonazepam last night. Had pulled out [facility] and cardiology had already been called and RN removed it per their recommendation. NO distress  eICU Interventions  Order placed and dw RN      Intervention Category Major Interventions: Delirium, psychosis, severe agitation - evaluation and management  Oretha Milch 08/21/2023, 9:47 PM

## 2023-08-21 NOTE — Progress Notes (Signed)
 PHARMACY - ANTICOAGULATION CONSULT NOTE  Pharmacy Consult for heparin Indication:  Mechanical mitral valve replacement  Allergies  Allergen Reactions   Lisinopril Swelling   Acetaminophen-Codeine Itching   Propoxyphene Itching    Darvocet   Tape Rash    Plastic   Tramadol Itching, Nausea And Vomiting and Rash    Patient Measurements: Height: 5\' 5"  (165.1 cm) Weight: 77.9 kg (171 lb 11.8 oz) IBW/kg (Calculated) : 57 Heparin Dosing Weight: TBW  Vital Signs: Temp: 97 F (36.1 C) (03/01 0900) BP: 91/78 (03/01 0900) Pulse Rate: 77 (03/01 0900)  Labs: Recent Labs    08/18/23 1624 08/18/23 1624 08/18/23 2132 08/18/23 2132 08/19/23 0457 08/19/23 1216 08/19/23 1300 08/19/23 1526 08/20/23 0521 08/20/23 1017 08/20/23 1700 08/21/23 0031 08/21/23 0500 08/21/23 0515 08/21/23 1216  HGB 8.4*  --  8.1*  --  8.2*  --   --    < > 7.9*   < > 7.7*  --   --  7.8* 8.1*  HCT 26.9*  --  25.9*  --  25.8*  --   --    < > 25.6*   < > 24.5*  --   --  24.3* 25.6*  PLT 430*  --  406*  --  432*  --   --    < > 433*  --  440*  --   --  430* 454*  APTT  --   --  66*  --   --   --   --   --   --   --   --   --   --   --   --   LABPROT  --   --   --   --  17.3*  --   --   --   --   --   --   --   --   --   --   INR  --   --   --   --  1.4*  --   --   --   --   --   --   --   --   --   --   HEPARINUNFRC  --    < > <0.10*  --  <0.10*  --   --    < > <0.10*  --   --  0.13*  --   --  <0.10*  CREATININE  --   --   --    < > 3.12* 2.81*  --   --  2.17*  --   --   --  1.47*  --   --   TROPONINIHS 198*  --   --   --   --   --  121*  --   --   --   --   --   --   --   --    < > = values in this interval not displayed.    Estimated Creatinine Clearance: 35.2 mL/min (A) (by C-G formula based on SCr of 1.47 mg/dL (H)).   Medical History: Past Medical History:  Diagnosis Date   Arthritis    Asthma    Breast cancer (HCC) 2001   Left Breast Cancer   CHF (congestive heart failure) (HCC)    COPD  (chronic obstructive pulmonary disease) (HCC)    Coronary artery disease    GERD (gastroesophageal reflux disease)    History of mitral valve replacement with mechanical valve    HLD (  hyperlipidemia)    Hypertension    Pulmonary embolism (HCC) 2021    Medications:  Medications Prior to Admission  Medication Sig Dispense Refill Last Dose/Taking   albuterol (VENTOLIN HFA) 108 (90 Base) MCG/ACT inhaler INHALE 2 PUFFS BY MOUTH EVERY 6 HOURS AS NEEDED FOR WHEEZING FOR SHORTNESS OF BREATH 9 g 0 08/17/2023 Noon   FARXIGA 10 MG TABS tablet Take 1 tablet by mouth once daily 90 tablet 0 08/17/2023 Morning   fluticasone (FLONASE) 50 MCG/ACT nasal spray Place 2 sprays into both nostrils daily. (Patient taking differently: Place 1 spray into both nostrils daily.) 11.1 mL 3 08/17/2023 Morning   gabapentin (NEURONTIN) 800 MG tablet Take 1 tablet (800 mg total) by mouth 3 (three) times daily. 90 tablet 5 08/17/2023   HYDROcodone-acetaminophen (NORCO/VICODIN) 5-325 MG tablet Take 1 tablet by mouth every 4 (four) hours as needed for moderate pain (pain score 4-6). 150 tablet 0 08/17/2023 Morning   losartan (COZAAR) 25 MG tablet Take 0.5 tablets (12.5 mg total) by mouth daily. 45 tablet 3 08/17/2023 Morning   metoprolol succinate (TOPROL-XL) 25 MG 24 hr tablet Take 1 tablet (25 mg total) by mouth 2 (two) times daily. 60 tablet 2 08/17/2023 Morning   potassium chloride (KLOR-CON M) 10 MEQ tablet Take 6 tablets (60 mEq total) by mouth daily. (Patient taking differently: Take 60 mEq by mouth 2 (two) times daily. Take 3 tablets in the morning and 3 tablets in the afternoon) 120 tablet 6 08/17/2023 Noon   simvastatin (ZOCOR) 20 MG tablet Take 1 tablet (20 mg total) by mouth daily. 90 tablet 2 08/17/2023 Morning   tetrahydrozoline 0.05 % ophthalmic solution Place 1 drop into both eyes 3 (three) times daily.   08/17/2023   torsemide (DEMADEX) 100 MG tablet Take 1 tablet (100 mg total) by mouth daily. 90 tablet 3 08/17/2023 Morning    TRELEGY ELLIPTA 100-62.5-25 MCG/ACT AEPB INHALE 1 PUFF ONCE DAILY INTO  THE  LUNGS 60 each 0 08/17/2023 Morning   warfarin (COUMADIN) 5 MG tablet Take one-and-one-half (1 & 1/2) of your 5 mg peach colored warfarin tablets on Mondays and Thursdays. All other days, take only one (1) tablet. (Patient taking differently: Take 5 mg by mouth daily.) 32 tablet 2 08/15/2023   Scheduled:   Chlorhexidine Gluconate Cloth  6 each Topical Daily   fluticasone furoate-vilanterol  1 puff Inhalation Daily   And   umeclidinium bromide  1 puff Inhalation Daily   insulin aspart  0-15 Units Subcutaneous Q4H   pantoprazole (PROTONIX) IV  40 mg Intravenous Q12H   sodium chloride flush  10-40 mL Intracatheter Q12H   sodium chloride flush  3 mL Intravenous Q12H   spironolactone  25 mg Oral Daily    Assessment: Kim Anthony is a 73 y.o.female with significant past medical history of  HFrEF [LVEF 35-40% 05/11/2022], mechanical mitral valve placement [2001] on chronic warfarin therapy [INR goal 2.5-3.5], paroxymal A-fib, CAD, HTN, HLD, hx breast cancer s/p left mastectomy and radiation/chemo, who presented to ED due to bloody stools and dizziness.   Pharmacy has been consulted to cautiously initiate heparin in setting of mechanical mitral valve replacement. Reportedly still having melenic stools. Discussed with CCM provider, Dr. Celine Mans and Dr. Sherrilee Gilles. Will proceed with IV heparin without bolus, and cautiously titrate to therapeutic level.   S/p RHC 2/28  - EGD on hold for now. Heparin level this morning came back undetectable at <0.1, on 1000 units/hr. Hgb 8.1, plt 454. No s/sx of bleeding or  infusion issues. Drawn peripherally on R arm and heparin running in R PICC line.   Goal of Therapy:  Heparin level 0.3-0.5 - try to keep on lower end of goal Monitor platelets by anticoagulation protocol: Yes   Plan:  Increase heparin infusion to 1100 uts/hr  Order heparin level in 8 hours Daily heparin level and CBC  Thank  you for allowing pharmacy to participate in this patient's care,  Sherron Monday, PharmD, BCCCP Clinical Pharmacist  Phone: [phone] 08/21/2023 1:09 PM  Please check AMION for all [facility] Pharmacy phone numbers After 10:00 PM, call Main Pharmacy [phone]

## 2023-08-21 NOTE — Progress Notes (Signed)
 [facility] ADULT ICU REPLACEMENT PROTOCOL   The patient does apply for the [facility] Adult ICU Electrolyte Replacment Protocol based on the criteria listed below:   1.Exclusion criteria: TCTS, ECMO, Dialysis, and Myasthenia Gravis patients 2. Is GFR >/= 30 ml/min? Yes.    Patient's GFR today is 37 3. Is SCr </= 2? Yes.   Patient's SCr is 1.47 mg/dL 4. Did SCr increase >/= 0.5 in 24 hours? No. 5.Pt's weight >40kg  Yes.   6. Abnormal electrolyte(s): K 2.8, mag 1.3  7. Electrolytes replaced per protocol 8.  Call MD STAT for K+ </= 2.5, Phos </= 1, or Mag </= 1 Physician:  Toney Rakes, Kenijah Benningfield A 08/21/2023 6:34 AM

## 2023-08-21 NOTE — Progress Notes (Signed)
 PHARMACY - ANTICOAGULATION CONSULT NOTE  Pharmacy Consult for heparin Indication:  MVR  Labs: Recent Labs    08/18/23 0758 08/18/23 1624 08/18/23 1624 08/18/23 2132 08/19/23 0457 08/19/23 1216 08/19/23 1300 08/19/23 1526 08/19/23 1636 08/19/23 2338 08/20/23 0521 08/20/23 1017 08/20/23 1700 08/21/23 0031  HGB 9.0* 8.4*  --  8.1* 8.2*  --   --    < >  --  7.3* 7.9* 9.5*  9.2* 7.7*  --   HCT 29.4* 26.9*  --  25.9* 25.8*  --   --    < >  --  23.3* 25.6* 28.0*  27.0* 24.5*  --   PLT  --  430*  --  406* 432*  --   --    < >  --  379 433*  --  440*  --   APTT  --   --   --  66*  --   --   --   --   --   --   --   --   --   --   LABPROT 19.1*  --   --   --  17.3*  --   --   --   --   --   --   --   --   --   INR 1.6*  --   --   --  1.4*  --   --   --   --   --   --   --   --   --   HEPARINUNFRC  --   --    < > <0.10* <0.10*  --   --   --  0.52  --  <0.10*  --   --  0.13*  CREATININE  --   --   --   --  3.12* 2.81*  --   --   --   --  2.17*  --   --   --   TROPONINIHS  --  198*  --   --   --   --  121*  --   --   --   --   --   --   --    < > = values in this interval not displayed.   Assessment: 72yo female subtherapeutic on heparin after resumed s/p RHC; no infusion issues or signs of bleeding per RN, Hgb fairly stable (disregarding 2/28 istat results), no melenic stools on this shift per RN.  Goal of Therapy:  Heparin level 0.3-0.5 units/ml   Plan:  Increase heparin infusion cautiously to 1000 units/hr. Check level in 8 hours.   Vernard Gambles, PharmD, BCPS 08/21/2023 2:07 AM

## 2023-08-21 NOTE — Progress Notes (Signed)
 NAME:  Kim Anthony, [name]:  [id number], DOB:  Nov 20, 1950, LOS: 3 ADMISSION DATE:  08/17/2023, CONSULTATION DATE:  08/18/23 REFERRING MD:  Dr. Lily Kocher, CHIEF COMPLAINT:  GI bleed   History of Present Illness:  73 year old female patient with multiple comorbidities as below, most prominently with mechanical mitral valve on chronic Coumadin therapy, A-fib present with lower GI bleed, bloody BMs, dizziness and weakness.   Of note patient has had previous episodes of melena, had colonoscopy in 2019 showing 20 mm polyp in internal hemorrhoids. Patient admits to recent NSAID use for past few weeks BID, has chronic pain, multiple complaints, critical care asked to see this patient due to symptomatic anemia and concern for worsening GI bleed.   Case is discussed with the hospitalist over the phone, is concerned about her IV access she currently has 2 peripheral IVs, we can continue to use this for blood products and IV fluids.  RN notified bedside by myself that if patient's IV access worsens and we will place a central line bedside.  Otherwise, patient can be admitted to the ICU for closer observation.  Pertinent  Medical History   has a past medical history of Arthritis, Asthma, Breast cancer (HCC) (2001), CHF (congestive heart failure) (HCC), COPD (chronic obstructive pulmonary disease) (HCC), Coronary artery disease, GERD (gastroesophageal reflux disease), History of mitral valve replacement with mechanical valve, HLD (hyperlipidemia), Hypertension, and Pulmonary embolism (HCC) (2021).   Significant Hospital Events: Including procedures, antibiotic start and stop dates in addition to other pertinent events   2/25 admitted to IMTS for hypotension and melena likely acute GI bleed, transfer to ICU due to limited IV access and close monitoring s/p 1u PRBC, GI consulted 2/26 PICC placed  2/27 cardiology consulted, coox 59% and CVP elevated    Interim History / Subjective:  Remain afebrile CVP still  elevated Making good amount of urine, net -2 L in last 24 hours On milrinone and Levophed  Objective   Blood pressure 91/78, pulse 77, temperature (!) 97 F (36.1 C), resp. rate 18, height 5\' 5"  (1.651 m), weight 77.9 kg, SpO2 100%. PAP: (23-119)/(7-85) 47/25 CVP:  [11 mmHg-59 mmHg] 19 mmHg CO:  [6.1 L/min-7.1 L/min] 7.1 L/min CI:  [3.96 L/min/m2-4.6 L/min/m2] 4.6 L/min/m2      Intake/Output Summary (Last 24 hours) at 08/21/2023 1059 Last data filed at 08/21/2023 0200 Gross per 24 hour  Intake 1209.44 ml  Output 3300 ml  Net -2090.56 ml   Filed Weights   08/18/23 0356 08/20/23 0500 08/21/23 0600  Weight: 51.2 kg 51.4 kg 77.9 kg    Examination:  General: Chronically ill-appearing female, lying on the bed HEENT: [place]/AT, eyes anicteric.  moist mucus membranes Neuro: Alert, awake following commands Chest: Bilateral faint basal crackles, no wheezes or rhonchi Heart: Regular rate and rhythm, mechanical valve click heard  Abdomen: Soft, nontender, nondistended, bowel sounds present Skin: No rash Extremities: Bilateral pitting edema noted right more than left  Labs and images reviewed  Coox 55%  Resolved Hospital Problem list   Lactic acidosis  Assessment & Plan:  Acute upper GI bleeding Acute blood loss anemia on chronic anemia  Patient presented with melena No further episodes of bleeding noted Received 1 unit PRBCs during this admission Appreciate GI follow-up Continue Protonix 40 mg twice daily Monitor H&H and transfuse if less than 8 Avoid NSAIDs  Acute on chronic RV failure with cardiogenic shock HFimpEF (EF 55%) Pulmonary hypertension Advanced heart failure team is following Continue milrinone and Levophed Continue aggressive  diuresis Monitor intake and output Remain net -2 L in last 24 hours CVP is elevated Continue spironolactone Not ready for GDMT considering shock   AKI on CKD 3a due to cardiorenal syndrome AGMA,  improved Hypokalemia/hypomagnesemia Hypervolemic hyponatremia Serum creatinine continue to improve with aggressive diuresis Monitor intake and output Avoid nephrotoxic agent Lactate has cleared Continue aggressive electrolyte replacement Closely monitor electrolytes  Demand cardiac ischemia Prolonged QT EKG showed no ST or T wave changes No chest pain Continue statin  Paroxysmal A-fib status post cardioversion 9/23 Mechanical mitral valve replacement on Coumadin Remain in sinus rhythm Continue telemetry monitoring Continue amiodarone infusion Continue IV heparin infusion Holding Coumadin for now  Shock liver In the setting of cardiogenic shock Monitor LFTs    COPD, not in exacerbation Continue inhalers   Chronic back pain Postherpetic neuralgia Chronic opiate use Gout Patient was taking BID NSAID for past few weeks  Continue as needed pain meds Avoid NSAIDs  Prediabetes A1c is 6 Continue sliding scale insulin with CBG goal 140-180  Best Practice (right click and "Reselect all SmartList Selections" daily)   Diet/type: Regular consistency DVT prophylaxis systemic heparin Pressure ulcer(s): N/A GI prophylaxis: PPI Lines: PICC, yes still needed Foley:  N/A Code Status:  full code Last date of multidisciplinary goals of care discussion [ updated daughter at bedside 2/28 ]  Labs   CBC: Recent Labs  Lab 08/17/23 2035 08/18/23 0137 08/18/23 0234 08/19/23 1526 08/19/23 2338 08/20/23 0521 08/20/23 1017 08/20/23 1700 08/21/23 0515  WBC 9.4 9.9   < > 11.8* 10.8* 11.6*  --  11.3* 11.5*  NEUTROABS 5.1 5.6  --   --   --   --   --   --   --   HGB 9.4* 8.7*   < > 8.1* 7.3* 7.9* 9.5*  9.2* 7.7* 7.8*  HCT 31.2* 29.3*   < > 25.7* 23.3* 25.6* 28.0*  27.0* 24.5* 24.3*  MCV 92.9 93.3   < > 90.8 91.0 90.8  --  92.5 92.4  PLT 419* 434*   < > 443* 379 433*  --  440* 430*   < > = values in this interval not displayed.    Basic Metabolic Panel: Recent Labs  Lab  08/18/23 0137 08/18/23 0234 08/19/23 0457 08/19/23 1216 08/19/23 1300 08/20/23 0521 08/20/23 1017 08/21/23 0500 08/21/23 0515  NA 139 139 139 139  --  137 138  139 130*  --   K 4.6 4.2 3.3* 3.3*  --  3.7 4.1  4.0 2.8*  --   CL 106 105 102 106  --  104  --  98  --   CO2 19* 17* 19* 21*  --  21*  --  22  --   GLUCOSE 66* 64* 92 98  --  115*  --  185*  --   BUN 58* 58* 59* 57*  --  51*  --  37*  --   CREATININE 4.34* 4.27* 3.12* 2.81*  --  2.17*  --  1.47*  --   CALCIUM 6.6* 6.8* 7.5* 7.3*  --  8.4*  --  8.1*  --   MG 1.5* 1.4*  --   --  2.1  --   --   --  1.3*  PHOS  --  3.9  --   --   --   --   --   --   --    GFR: Estimated Creatinine Clearance: 35.2 mL/min (A) (by C-G formula based on  SCr of 1.47 mg/dL (H)). Recent Labs  Lab 08/18/23 0026 08/18/23 0137 08/18/23 0251 08/18/23 1624 08/19/23 2338 08/20/23 0521 08/20/23 1700 08/21/23 0515  WBC  --    < >  --    < > 10.8* 11.6* 11.3* 11.5*  LATICACIDVEN 2.4*  --  2.0*  --   --   --   --   --    < > = values in this interval not displayed.    Liver Function Tests: Recent Labs  Lab 08/17/23 2035 08/18/23 0137 08/19/23 0457 08/19/23 1300 08/20/23 0521 08/21/23 0500  AST 56* 55* 47*  --  43* 32  ALT 18 17 18   --  17 15  ALKPHOS 140* 132* 120  --  124 123  BILITOT 1.5* 1.3* 2.1* 1.6* 1.4* 1.4*  PROT 6.7 6.0* 6.0*  --  6.2* 5.7*  ALBUMIN 2.4* 2.2* 2.1*  --  2.1* 2.0*   Recent Labs  Lab 08/18/23 0212  LIPASE 19  AMYLASE 73   No results for input(s): "AMMONIA" in the last 168 hours.  ABG    Component Value Date/Time   PHART 7.345 (L) 01/09/2021 1341   PCO2ART 44.2 01/09/2021 1341   PO2ART 78 (L) 01/09/2021 1341   HCO3 20.7 08/20/2023 1017   HCO3 21.2 08/20/2023 1017   TCO2 22 08/20/2023 1017   TCO2 22 08/20/2023 1017   ACIDBASEDEF 5.0 (H) 08/20/2023 1017   ACIDBASEDEF 4.0 (H) 08/20/2023 1017   O2SAT 55.6 08/21/2023 0459     Coagulation Profile: Recent Labs  Lab 08/18/23 0137 08/18/23 0758  08/19/23 0457  INR >10.0* 1.6* 1.4*    Cardiac Enzymes: No results for input(s): "CKTOTAL", "CKMB", "CKMBINDEX", "TROPONINI" in the last 168 hours.  HbA1C: Hgb A1c MFr Bld  Date/Time Value Ref Range Status  08/18/2023 02:12 AM 6.0 (H) 4.8 - 5.6 % Final    Comment:    (NOTE) Pre diabetes:          5.7%-6.4%  Diabetes:              >6.4%  Glycemic control for   <7.0% adults with diabetes   09/21/2022 10:24 AM 5.5 4.8 - 5.6 % Final    Comment:    (NOTE)         Prediabetes: 5.7 - 6.4         Diabetes: >6.4         Glycemic control for adults with diabetes: <7.0     CBG: Recent Labs  Lab 08/20/23 1902 08/20/23 2005 08/21/23 0003 08/21/23 0349 08/21/23 0827  GLUCAP 127* 128* 119* 108* 154*    Critical care time:     The patient is critically ill due to acute on chronic RV failure with cardiogenic shock/acute GI bleeding.  Critical care was necessary to treat or prevent imminent or life-threatening deterioration.  Critical care was time spent personally by me on the following activities: development of treatment plan with patient and/or surrogate as well as nursing, discussions with consultants, evaluation of patient's response to treatment, examination of patient, obtaining history from patient or surrogate, ordering and performing treatments and interventions, ordering and review of laboratory studies, ordering and review of radiographic studies, pulse oximetry, re-evaluation of patient's condition and participation in multidisciplinary rounds.   During this encounter critical care time was devoted to patient care services described in this note for 39 minutes.     Cheri Fowler, MD [facility] See Amion for pager If no response to pager,  please call [phone] until 7pm After 7pm, Please call E-link [phone]

## 2023-08-21 NOTE — Progress Notes (Signed)
 Advanced Heart Failure Rounding Note  Cardiologist: Arvilla Meres, MD  Chief Complaint: RV Failure Subjective:    3.3L UOP. Net negative almost 2L, CVP now 19, PA pressures 45/21. CVP port not hooked up to pressure overnight so blood has backed up and clotted the port, unable to draw back or flush.   SBP 80s-90s this morning on NE 5, milrinone 0.125.  Some shoulder pain and pain at swan site. Otherwise doing well.   Objective:   Weight Range: 77.9 kg Body mass index is 28.58 kg/m.   Vital Signs:   Temp:  [97 F (36.1 C)-99 F (37.2 C)] 97 F (36.1 C) (03/01 0900) Pulse Rate:  [67-101] 77 (03/01 0900) Resp:  [15-29] 18 (03/01 0900) BP: (64-189)/(39-152) 91/78 (03/01 0900) SpO2:  [73 %-100 %] 100 % (03/01 0900) Weight:  [77.9 kg] 77.9 kg (03/01 0600) Last BM Date : 08/20/23  Weight change: Filed Weights   08/18/23 0356 08/20/23 0500 08/21/23 0600  Weight: 51.2 kg 51.4 kg 77.9 kg   Intake/Output:   Intake/Output Summary (Last 24 hours) at 08/21/2023 1154 Last data filed at 08/21/2023 0200 Gross per 24 hour  Intake 1209.44 ml  Output 3300 ml  Net -2090.56 ml    CVP: 21  Physical Exam    General: Weak appearing. No distress on [place] Cardiac: JVP to jaw. S1 and S2 present. Mechanical click Abdomen: Taut, mildly distended.  Extremities: Warm and dry. 2+ BLE edema.  Neuro: Alert and oriented x3. Affect pleasant. Moves all extremities without difficulty. Lines/Devices:  RUE PICC  Telemetry   SR in 70s (personally reviewed)  EKG    N/A  Labs    CBC Recent Labs    08/20/23 1700 08/21/23 0515  WBC 11.3* 11.5*  HGB 7.7* 7.8*  HCT 24.5* 24.3*  MCV 92.5 92.4  PLT 440* 430*   Basic Metabolic Panel Recent Labs    66/44/03 1300 08/20/23 0521 08/20/23 1017 08/21/23 0500 08/21/23 0515  NA  --  137 138  139 130*  --   K  --  3.7 4.1  4.0 2.8*  --   CL  --  104  --  98  --   CO2  --  21*  --  22  --   GLUCOSE  --  115*  --  185*  --   BUN  --   51*  --  37*  --   CREATININE  --  2.17*  --  1.47*  --   CALCIUM  --  8.4*  --  8.1*  --   MG 2.1  --   --   --  1.3*   Liver Function Tests Recent Labs    08/20/23 0521 08/21/23 0500  AST 43* 32  ALT 17 15  ALKPHOS 124 123  BILITOT 1.4* 1.4*  PROT 6.2* 5.7*  ALBUMIN 2.1* 2.0*   No results for input(s): "LIPASE", "AMYLASE" in the last 72 hours.  Cardiac Enzymes No results for input(s): "CKTOTAL", "CKMB", "CKMBINDEX", "TROPONINI" in the last 72 hours.  BNP: BNP (last 3 results) Recent Labs    01/19/23 1601 07/29/23 1347 08/17/23 2035  BNP 264.0* 374.8* 547.9*   ProBNP (last 3 results) No results for input(s): "PROBNP" in the last 8760 hours.  D-Dimer No results for input(s): "DDIMER" in the last 72 hours. Hemoglobin A1C No results for input(s): "HGBA1C" in the last 72 hours.  Fasting Lipid Panel No results for input(s): "CHOL", "HDL", "LDLCALC", "TRIG", "CHOLHDL", "LDLDIRECT" in  the last 72 hours. Thyroid Function Tests No results for input(s): "TSH", "T4TOTAL", "T3FREE", "THYROIDAB" in the last 72 hours.  Invalid input(s): "FREET3"  Other results:    Medications:    Scheduled Medications:  Chlorhexidine Gluconate Cloth  6 each Topical Daily   fluticasone furoate-vilanterol  1 puff Inhalation Daily   And   umeclidinium bromide  1 puff Inhalation Daily   insulin aspart  0-15 Units Subcutaneous Q4H   pantoprazole (PROTONIX) IV  40 mg Intravenous Q12H   sodium chloride flush  10-40 mL Intracatheter Q12H   sodium chloride flush  3 mL Intravenous Q12H   spironolactone  25 mg Oral Daily    Infusions:  sodium chloride Stopped (08/20/23 1159)   sodium chloride     amiodarone 30 mg/hr (08/21/23 1106)   furosemide 120 mg (08/21/23 1004)   heparin 1,000 Units/hr (08/21/23 0224)   milrinone 0.125 mcg/kg/min (08/21/23 0200)   norepinephrine (LEVOPHED) Adult infusion 5 mcg/min (08/21/23 1002)   potassium chloride 10 mEq (08/21/23 1105)    PRN  Medications: sodium chloride, acetaminophen, docusate sodium, HYDROmorphone, ondansetron (ZOFRAN) IV, mouth rinse, polyethylene glycol, sodium chloride flush, sodium chloride flush  Patient Profile   Kim Anthony is a 73 y.o. female with a history of mechanical MVR in 2001 in [place], [place], CAD, hx breast cancer s/p left mastectomy and radiation/chemo, tobacco use, COPD, prior PE/DVT, HTN, undergoing workup for possible inflammatory arthritis with Rheumatology, and systolic CHF.   Assessment/Plan   GIB - EGD clearance Hypotension Blood loss anemia - hypotension with melena requiring RBCs - echo with evidence of RV strain and overload, would diurese to goal CVP 10 prior to EGD.  - No BM or melena overnight. - hgb 7.8 - Repeat lasix as below - Continue NE - bleeding management per CCM   2. Acute on chronic RV Failure HFimpEF Pulmonary HTN  - Echo (2017): EF 45-50% in setting of PE. Has mild to moderate RV dysfunction. MV prosthesis okay.  - Over the years has had EF recovery with RV dysfunction with relapses to 35-40% with RV dysfunction in the setting of AF RVR. - R/LHC (7/22): EF 35-40% with 1v CAD mRCA chronically occluded and well compensated hemodynamics with mild pulmonary HTN and normal CO - Suspect likely underlying OSA contributing to PH/RV failure. Also w/ known COPD. - Echo 2/25: EF 50%, mild LVH, RV severely reduced, normal MV prosthesis, moderate to severe TR. - Repeat with EF 55% and D-shaped septum, RV-RA gradient 25 mmHg and severe TR (does not coapt) - NYHA III-IV on admit - Coox 55 this morning, right sided filling pressures still high but urine output improving. Continue milrinone/norepinephrine assisted diuresis today with 120mg  IV lasix BID   3. AKI/hypokalemia - in the setting of RV congestion - Improving with IV diuresis, now 1.47 - K 2.8, aggressive repletion with PM recheck   4. PAF - She does not do well in AFL/AF. - s/p TEE and DCCV 9/23 to NSR. - On/Off  PO amio for prolonged QTc - Recent admit 2/25 with AFL, converted to NSR on amio, but then stopped QTc 510 msec - In NSR on tele - Continue heparin gtt. - continue amio gtt   5. CAD  Elevated Troponin - 1 V CAD with occluded mid RCA on cath (7/22). - hsTrop [id number]. Likely demand ischemia, decongest and follow - EKG without ST changes - No s/s angina. - on heparin - continue statin    6. Hx of mechanical mitral valve  replacement: - Mechanical MVR in [place] in 2001. - Stable on echo 2/25 MV gradient 3 - On heparin with bleed - Continue SBE prophylaxis.   7. Tricuspid valve regurgitation - severe on echo 2/25 - Follow    8. Gout - has been taking NSAIDs and Prednisone at home.   Length of Stay: 3  CRITICAL CARE Performed by: Romie Minus  Total critical care time: 20 minutes  Critical care time was exclusive of separately billable procedures and treating other patients.  Critical care was necessary to treat or prevent imminent or life-threatening deterioration.  Critical care was time spent personally by me on the following activities: development of treatment plan with patient and/or surrogate as well as nursing, discussions with consultants, evaluation of patient's response to treatment, examination of patient, obtaining history from patient or surrogate, ordering and performing treatments and interventions, ordering and review of laboratory studies, ordering and review of radiographic studies, pulse oximetry and re-evaluation of patient's condition.  Romie Minus, MD  08/21/2023, 11:54 AM  Advanced Heart Failure Team Pager [phone] (M-F; 7a - 5p)  Please contact [facility] for night-coverage after hours (5p -7a ) and weekends on [email or web address]   CRITICAL CARE Performed by: Romie Minus   Total critical care time: 42 minutes  Critical care time was exclusive of separately billable procedures and treating other patients.  Critical care was  necessary to treat or prevent imminent or life-threatening deterioration.  Critical care was time spent personally by me on the following activities: development of treatment plan with patient and/or surrogate as well as nursing, discussions with consultants, evaluation of patient's response to treatment, examination of patient, obtaining history from patient or surrogate, ordering and performing treatments and interventions, ordering and review of laboratory studies, ordering and review of radiographic studies, pulse oximetry and re-evaluation of patient's condition. [name]  Romie Minus, MD  11:54 AM

## 2023-08-21 NOTE — Progress Notes (Signed)
 PHARMACY - ANTICOAGULATION CONSULT NOTE  Pharmacy Consult for heparin Indication:  Mechanical mitral valve replacement  Allergies  Allergen Reactions   Lisinopril Swelling   Acetaminophen-Codeine Itching   Propoxyphene Itching    Darvocet   Tape Rash    Plastic   Tramadol Itching, Nausea And Vomiting and Rash    Patient Measurements: Height: 5\' 5"  (165.1 cm) Weight: 77.9 kg (171 lb 11.8 oz) IBW/kg (Calculated) : 57 Heparin Dosing Weight: TBW  Vital Signs: Temp: 98.2 F (36.8 C) (03/01 2045) BP: 75/63 (03/01 2100) Pulse Rate: 77 (03/01 2100)  Labs: Recent Labs    08/19/23 0457 08/19/23 1216 08/19/23 1300 08/19/23 1526 08/20/23 0521 08/20/23 1017 08/21/23 0031 08/21/23 0500 08/21/23 0515 08/21/23 1216 08/21/23 1541 08/21/23 2032  HGB 8.2*  --   --    < > 7.9*   < >  --   --  7.8* 8.1* 7.9*  --   HCT 25.8*  --   --    < > 25.6*   < >  --   --  24.3* 25.6* 25.4*  --   PLT 432*  --   --    < > 433*   < >  --   --  430* 454* 433*  --   LABPROT 17.3*  --   --   --   --   --   --   --   --   --   --   --   INR 1.4*  --   --   --   --   --   --   --   --   --   --   --   HEPARINUNFRC <0.10*  --   --    < > <0.10*  --  0.13*  --   --  <0.10*  --  <0.10*  CREATININE 3.12*   < >  --   --  2.17*  --   --  1.47*  --   --  1.18*  --   TROPONINIHS  --   --  121*  --   --   --   --   --   --   --   --   --    < > = values in this interval not displayed.    Estimated Creatinine Clearance: 43.8 mL/min (A) (by C-G formula based on SCr of 1.18 mg/dL (H)).   Medical History: Past Medical History:  Diagnosis Date   Arthritis    Asthma    Breast cancer (HCC) 2001   Left Breast Cancer   CHF (congestive heart failure) (HCC)    COPD (chronic obstructive pulmonary disease) (HCC)    Coronary artery disease    GERD (gastroesophageal reflux disease)    History of mitral valve replacement with mechanical valve    HLD (hyperlipidemia)    Hypertension    Pulmonary embolism (HCC)  2021    Medications:  Medications Prior to Admission  Medication Sig Dispense Refill Last Dose/Taking   albuterol (VENTOLIN HFA) 108 (90 Base) MCG/ACT inhaler INHALE 2 PUFFS BY MOUTH EVERY 6 HOURS AS NEEDED FOR WHEEZING FOR SHORTNESS OF BREATH 9 g 0 08/17/2023 Noon   FARXIGA 10 MG TABS tablet Take 1 tablet by mouth once daily 90 tablet 0 08/17/2023 Morning   fluticasone (FLONASE) 50 MCG/ACT nasal spray Place 2 sprays into both nostrils daily. (Patient taking differently: Place 1 spray into both nostrils daily.) 11.1 mL 3 08/17/2023 Morning  gabapentin (NEURONTIN) 800 MG tablet Take 1 tablet (800 mg total) by mouth 3 (three) times daily. 90 tablet 5 08/17/2023   HYDROcodone-acetaminophen (NORCO/VICODIN) 5-325 MG tablet Take 1 tablet by mouth every 4 (four) hours as needed for moderate pain (pain score 4-6). 150 tablet 0 08/17/2023 Morning   losartan (COZAAR) 25 MG tablet Take 0.5 tablets (12.5 mg total) by mouth daily. 45 tablet 3 08/17/2023 Morning   metoprolol succinate (TOPROL-XL) 25 MG 24 hr tablet Take 1 tablet (25 mg total) by mouth 2 (two) times daily. 60 tablet 2 08/17/2023 Morning   potassium chloride (KLOR-CON M) 10 MEQ tablet Take 6 tablets (60 mEq total) by mouth daily. (Patient taking differently: Take 60 mEq by mouth 2 (two) times daily. Take 3 tablets in the morning and 3 tablets in the afternoon) 120 tablet 6 08/17/2023 Noon   simvastatin (ZOCOR) 20 MG tablet Take 1 tablet (20 mg total) by mouth daily. 90 tablet 2 08/17/2023 Morning   tetrahydrozoline 0.05 % ophthalmic solution Place 1 drop into both eyes 3 (three) times daily.   08/17/2023   torsemide (DEMADEX) 100 MG tablet Take 1 tablet (100 mg total) by mouth daily. 90 tablet 3 08/17/2023 Morning   TRELEGY ELLIPTA 100-62.5-25 MCG/ACT AEPB INHALE 1 PUFF ONCE DAILY INTO  THE  LUNGS 60 each 0 08/17/2023 Morning   warfarin (COUMADIN) 5 MG tablet Take one-and-one-half (1 & 1/2) of your 5 mg peach colored warfarin tablets on Mondays and  Thursdays. All other days, take only one (1) tablet. (Patient taking differently: Take 5 mg by mouth daily.) 32 tablet 2 08/15/2023   Scheduled:   Chlorhexidine Gluconate Cloth  6 each Topical Daily   fluticasone furoate-vilanterol  1 puff Inhalation Daily   And   umeclidinium bromide  1 puff Inhalation Daily   insulin aspart  0-15 Units Subcutaneous Q4H   pantoprazole (PROTONIX) IV  40 mg Intravenous Q12H   sodium chloride flush  10-40 mL Intracatheter Q12H   sodium chloride flush  3 mL Intravenous Q12H   spironolactone  25 mg Oral Daily    Assessment: Kim Anthony is a 73 y.o.female with significant past medical history of  HFrEF [LVEF 35-40% 05/11/2022], mechanical mitral valve placement [2001] on chronic warfarin therapy [INR goal 2.5-3.5], paroxymal A-fib, CAD, HTN, HLD, hx breast cancer s/p left mastectomy and radiation/chemo, who presented to ED due to bloody stools and dizziness.   Pharmacy has been consulted to cautiously initiate heparin in setting of mechanical mitral valve replacement. Reportedly still having melenic stools. Discussed with CCM provider, Dr. Celine Mans and Dr. Sherrilee Gilles. Will proceed with IV heparin without bolus, and cautiously titrate to therapeutic level.   S/p RHC 2/28  - EGD on hold for now. Heparin level this morning came back undetectable at <0.1, on 1000 units/hr. Hgb 8.1, plt 454. No s/sx of bleeding or infusion issues. Drawn peripherally on R arm and heparin running in R PICC line.   PM F/u - heparin level still undetectable.  No overt bleeding or complications noted.  Goal of Therapy:  Heparin level 0.3-0.5 - try to keep on lower end of goal Monitor platelets by anticoagulation protocol: Yes   Plan:  Increase heparin infusion to 1300 uts/hr  Order heparin level in 8 hours Daily heparin level and CBC  Thank you for allowing pharmacy to participate in this patient's care,  Reece Leader, Colon Flattery, [facility] Clinical Pharmacist  08/21/2023 10:11 PM   [facility]  pharmacy phone numbers are listed on  [email or web address]

## 2023-08-22 DIAGNOSIS — R570 Cardiogenic shock: Secondary | ICD-10-CM | POA: Diagnosis not present

## 2023-08-22 DIAGNOSIS — K922 Gastrointestinal hemorrhage, unspecified: Secondary | ICD-10-CM | POA: Diagnosis not present

## 2023-08-22 DIAGNOSIS — I272 Pulmonary hypertension, unspecified: Secondary | ICD-10-CM | POA: Diagnosis not present

## 2023-08-22 DIAGNOSIS — N179 Acute kidney failure, unspecified: Secondary | ICD-10-CM | POA: Diagnosis not present

## 2023-08-22 LAB — COMPREHENSIVE METABOLIC PANEL
ALT: 15 U/L (ref 0–44)
ALT: 14 U/L (ref 0–44)
AST: 30 U/L (ref 15–41)
AST: 31 U/L (ref 15–41)
Albumin: 2 g/dL — ABNORMAL LOW (ref 3.5–5.0)
Albumin: 2.2 g/dL — ABNORMAL LOW (ref 3.5–5.0)
Alkaline Phosphatase: 127 U/L — ABNORMAL HIGH (ref 38–126)
Alkaline Phosphatase: 133 U/L — ABNORMAL HIGH (ref 38–126)
Anion gap: 10 (ref 5–15)
Anion gap: 9 (ref 5–15)
BUN: 25 mg/dL — ABNORMAL HIGH (ref 8–23)
BUN: 18 mg/dL (ref 8–23)
CO2: 24 mmol/L (ref 22–32)
CO2: 29 mmol/L (ref 22–32)
Calcium: 8.4 mg/dL — ABNORMAL LOW (ref 8.9–10.3)
Calcium: 8.8 mg/dL — ABNORMAL LOW (ref 8.9–10.3)
Chloride: 96 mmol/L — ABNORMAL LOW (ref 98–111)
Chloride: 93 mmol/L — ABNORMAL LOW (ref 98–111)
Creatinine, Ser: 1.02 mg/dL — ABNORMAL HIGH (ref 0.44–1.00)
Creatinine, Ser: 0.97 mg/dL (ref 0.44–1.00)
GFR, Estimated: 58 mL/min — ABNORMAL LOW (ref >60)
GFR, Estimated: >60 mL/min (ref >60)
Glucose, Bld: 126 mg/dL — ABNORMAL HIGH (ref 70–99)
Glucose, Bld: 114 mg/dL — ABNORMAL HIGH (ref 70–99)
Potassium: 3.5 mmol/L (ref 3.5–5.1)
Potassium: 3.4 mmol/L — ABNORMAL LOW (ref 3.5–5.1)
Sodium: 130 mmol/L — ABNORMAL LOW (ref 135–145)
Sodium: 131 mmol/L — ABNORMAL LOW (ref 135–145)
Total Bilirubin: 1.5 mg/dL — ABNORMAL HIGH (ref 0.0–1.2)
Total Bilirubin: 1.5 mg/dL — ABNORMAL HIGH (ref 0.0–1.2)
Total Protein: 6 g/dL — ABNORMAL LOW (ref 6.5–8.1)
Total Protein: 6.5 g/dL (ref 6.5–8.1)

## 2023-08-22 LAB — COOXEMETRY PANEL
Carboxyhemoglobin: 2 % — ABNORMAL HIGH (ref 0.5–1.5)
Methemoglobin: 0.9 % (ref 0.0–1.5)
O2 Saturation: 64.1 %
Total hemoglobin: 8.2 g/dL — ABNORMAL LOW (ref 12.0–16.0)

## 2023-08-22 LAB — CBC
HCT: 24.4 % — ABNORMAL LOW (ref 36.0–46.0)
HCT: 25.8 % — ABNORMAL LOW (ref 36.0–46.0)
Hemoglobin: 7.7 g/dL — ABNORMAL LOW (ref 12.0–15.0)
Hemoglobin: 8.3 g/dL — ABNORMAL LOW (ref 12.0–15.0)
MCH: 28.4 pg (ref 26.0–34.0)
MCH: 28.7 pg (ref 26.0–34.0)
MCHC: 31.6 g/dL (ref 30.0–36.0)
MCHC: 32.2 g/dL (ref 30.0–36.0)
MCV: 90 fL (ref 80.0–100.0)
MCV: 89.3 fL (ref 80.0–100.0)
Platelets: 412 10*3/uL — ABNORMAL HIGH (ref 150–400)
Platelets: 428 10*3/uL — ABNORMAL HIGH (ref 150–400)
RBC: 2.71 MIL/uL — ABNORMAL LOW (ref 3.87–5.11)
RBC: 2.89 MIL/uL — ABNORMAL LOW (ref 3.87–5.11)
RDW: 17.6 % — ABNORMAL HIGH (ref 11.5–15.5)
RDW: 18 % — ABNORMAL HIGH (ref 11.5–15.5)
WBC: 11.7 10*3/uL — ABNORMAL HIGH (ref 4.0–10.5)
WBC: 12.5 10*3/uL — ABNORMAL HIGH (ref 4.0–10.5)
nRBC: 0.7 % — ABNORMAL HIGH (ref 0.0–0.2)
nRBC: 0.5 % — ABNORMAL HIGH (ref 0.0–0.2)

## 2023-08-22 LAB — BASIC METABOLIC PANEL
Anion gap: 10 (ref 5–15)
BUN: 21 mg/dL (ref 8–23)
CO2: 26 mmol/L (ref 22–32)
Calcium: 8.9 mg/dL (ref 8.9–10.3)
Chloride: 94 mmol/L — ABNORMAL LOW (ref 98–111)
Creatinine, Ser: 0.93 mg/dL (ref 0.44–1.00)
GFR, Estimated: >60 mL/min (ref >60)
Glucose, Bld: 151 mg/dL — ABNORMAL HIGH (ref 70–99)
Potassium: 3.3 mmol/L — ABNORMAL LOW (ref 3.5–5.1)
Sodium: 130 mmol/L — ABNORMAL LOW (ref 135–145)

## 2023-08-22 LAB — HEPARIN LEVEL (UNFRACTIONATED)
Heparin Unfractionated: 0.67 [IU]/mL (ref 0.30–0.70)
Heparin Unfractionated: 0.12 [IU]/mL — ABNORMAL LOW (ref 0.30–0.70)
Heparin Unfractionated: 0.19 [IU]/mL — ABNORMAL LOW (ref 0.30–0.70)

## 2023-08-22 LAB — MAGNESIUM
Magnesium: 1.9 mg/dL (ref 1.7–2.4)
Magnesium: 2.2 mg/dL (ref 1.7–2.4)
Magnesium: 1.7 mg/dL (ref 1.7–2.4)

## 2023-08-22 LAB — GLUCOSE, CAPILLARY
Glucose-Capillary: 112 mg/dL — ABNORMAL HIGH (ref 70–99)
Glucose-Capillary: 105 mg/dL — ABNORMAL HIGH (ref 70–99)
Glucose-Capillary: 112 mg/dL — ABNORMAL HIGH (ref 70–99)
Glucose-Capillary: 91 mg/dL (ref 70–99)
Glucose-Capillary: 94 mg/dL (ref 70–99)
Glucose-Capillary: 98 mg/dL (ref 70–99)

## 2023-08-22 MED ORDER — POTASSIUM CHLORIDE CRYS ER 20 MEQ PO TBCR
40.0000 meq | EXTENDED_RELEASE_TABLET | Freq: Once | ORAL | Status: AC
  Administered 2023-08-22: 40 meq via ORAL
  Filled 2023-08-22: qty 2

## 2023-08-22 MED ORDER — CLONAZEPAM 0.5 MG PO TABS
0.5000 mg | ORAL_TABLET | Freq: Two times a day (BID) | ORAL | Status: DC | PRN
  Administered 2023-08-22 – 2023-08-23 (×3): 0.5 mg via ORAL
  Filled 2023-08-22 (×3): qty 1

## 2023-08-22 MED ORDER — POLYETHYLENE GLYCOL 3350 17 G PO PACK: 17.0000 g | PACK | Freq: Every day | ORAL | Status: DC

## 2023-08-22 MED ORDER — POLYETHYLENE GLYCOL 3350 17 G PO PACK
17.0000 g | PACK | Freq: Every day | ORAL | Status: DC
  Administered 2023-08-22 – 2023-08-23 (×2): 17 g via ORAL
  Filled 2023-08-22 (×2): qty 1

## 2023-08-22 MED ORDER — SENNOSIDES 8.8 MG/5ML PO SYRP
5.0000 mL | ORAL_SOLUTION | Freq: Two times a day (BID) | ORAL | Status: DC
  Administered 2023-08-22 (×2): 5 mL
  Filled 2023-08-22 (×2): qty 5

## 2023-08-22 MED ORDER — MAGNESIUM SULFATE 2 GM/50ML IV SOLN
2.0000 g | Freq: Once | INTRAVENOUS | Status: AC
  Administered 2023-08-22: 2 g via INTRAVENOUS
  Filled 2023-08-22: qty 50

## 2023-08-22 MED ORDER — POTASSIUM CHLORIDE 10 MEQ/50ML IV SOLN
10.0000 meq | INTRAVENOUS | Status: AC
  Administered 2023-08-22 – 2023-08-23 (×4): 10 meq via INTRAVENOUS
  Filled 2023-08-22: qty 50

## 2023-08-22 MED ORDER — OLANZAPINE 2.5 MG PO TABS
2.5000 mg | ORAL_TABLET | Freq: Once | ORAL | Status: AC
  Administered 2023-08-22: 2.5 mg via ORAL
  Filled 2023-08-22: qty 1

## 2023-08-22 MED ORDER — FUROSEMIDE 10 MG/ML IJ SOLN
120.0000 mg | Freq: Two times a day (BID) | INTRAVENOUS | Status: AC
  Administered 2023-08-22 (×2): 120 mg via INTRAVENOUS
  Filled 2023-08-22 (×2): qty 10

## 2023-08-22 NOTE — Progress Notes (Signed)
 [facility] Gastroenterology Progress Note  Kim Anthony 73 y.o. October 17, 1950   Subjective: Denies abdominal pain. No melena or rectal bleeding overnight.  Objective: Vital signs: Vitals:   08/22/23 1200 08/22/23 1300  BP: (!) 132/56 (!) 109/55  Pulse: 72 76  Resp: 12 18  Temp:    SpO2: 98% 97%  T 97.4  Physical Exam: Gen: chronically ill-appearing, elderly, well-nourished, no acute distress  HEENT: anicteric sclera CV: RRR Chest: CTA B Abd: soft, nontender, nondistended, +BS Ext: no edema  Lab Results: Recent Labs    08/22/23 0453 08/22/23 1217  NA 130* 130*  K 3.5 3.3*  CL 96* 94*  CO2 24 26  GLUCOSE 126* 151*  BUN 25* 21  CREATININE 1.02* 0.93  CALCIUM 8.4* 8.9  MG 1.9 2.2   Recent Labs    08/21/23 0500 08/22/23 0453  AST 32 30  ALT 15 15  ALKPHOS 123 127*  BILITOT 1.4* 1.5*  PROT 5.7* 6.0*  ALBUMIN 2.0* 2.0*   Recent Labs    08/21/23 1216 08/21/23 1541 08/22/23 0453  WBC 10.5 10.7* 11.7*  NEUTROABS 6.5  --   --   HGB 8.1* 7.9* 7.7*  HCT 25.6* 25.4* 24.4*  MCV 89.8 91.4 90.0  PLT 454* 433* 412*   INR 1.4 (08/19/23)   Assessment/Plan: Melenic stools in the setting of CHF and supratherapeutic INR over 10 on 2/26. No bleeding since 2/26. Coumadin on hold. CVP improving and close to recommended level of 10 prior to EGD. Patient on low dose norepinephrine and milrinone. NPO p MN. EGD by Dr. Lorenso Quarry scheduled for tomorrow morning at bedside. Dr. Elwyn Lade aware.   [name] 08/22/2023, 3:04 PM  Questions please call [phone] ID: Kim Anthony, female   DOB: 12-07-1950, 73 y.o.   [name]: [id number]

## 2023-08-22 NOTE — Progress Notes (Signed)
 PHARMACY - ANTICOAGULATION CONSULT NOTE  Pharmacy Consult for heparin Indication:  Mechanical mitral valve replacement  Allergies  Allergen Reactions   Lisinopril Swelling   Acetaminophen-Codeine Itching   Propoxyphene Itching    Darvocet   Tape Rash    Plastic   Tramadol Itching, Nausea And Vomiting and Rash    Patient Measurements: Height: 5\' 5"  (165.1 cm) Weight: 77.9 kg (171 lb 11.8 oz) IBW/kg (Calculated) : 57 Heparin Dosing Weight: TBW  Vital Signs: Temp: 97.6 F (36.4 C) (03/02 0300) Temp Source: Oral (03/02 0300) BP: 140/56 (03/02 0645) Pulse Rate: 76 (03/02 0645)  Labs: Recent Labs    08/19/23 1300 08/19/23 1526 08/21/23 0500 08/21/23 0515 08/21/23 1216 08/21/23 1541 08/21/23 2032 08/22/23 0453  HGB  --    < >  --    < > 8.1* 7.9*  --  7.7*  HCT  --    < >  --    < > 25.6* 25.4*  --  24.4*  PLT  --    < >  --    < > 454* 433*  --  412*  HEPARINUNFRC  --    < >  --   --  <0.10*  --  <0.10* 0.67  CREATININE  --    < > 1.47*  --   --  1.18*  --  1.02*  TROPONINIHS 121*  --   --   --   --   --   --   --    < > = values in this interval not displayed.    Estimated Creatinine Clearance: 50.7 mL/min (A) (by C-G formula based on SCr of 1.02 mg/dL (H)).   Medical History: Past Medical History:  Diagnosis Date   Arthritis    Asthma    Breast cancer (HCC) 2001   Left Breast Cancer   CHF (congestive heart failure) (HCC)    COPD (chronic obstructive pulmonary disease) (HCC)    Coronary artery disease    GERD (gastroesophageal reflux disease)    History of mitral valve replacement with mechanical valve    HLD (hyperlipidemia)    Hypertension    Pulmonary embolism (HCC) 2021    Medications:  Medications Prior to Admission  Medication Sig Dispense Refill Last Dose/Taking   albuterol (VENTOLIN HFA) 108 (90 Base) MCG/ACT inhaler INHALE 2 PUFFS BY MOUTH EVERY 6 HOURS AS NEEDED FOR WHEEZING FOR SHORTNESS OF BREATH 9 g 0 08/17/2023 Noon   FARXIGA 10 MG TABS  tablet Take 1 tablet by mouth once daily 90 tablet 0 08/17/2023 Morning   fluticasone (FLONASE) 50 MCG/ACT nasal spray Place 2 sprays into both nostrils daily. (Patient taking differently: Place 1 spray into both nostrils daily.) 11.1 mL 3 08/17/2023 Morning   gabapentin (NEURONTIN) 800 MG tablet Take 1 tablet (800 mg total) by mouth 3 (three) times daily. 90 tablet 5 08/17/2023   HYDROcodone-acetaminophen (NORCO/VICODIN) 5-325 MG tablet Take 1 tablet by mouth every 4 (four) hours as needed for moderate pain (pain score 4-6). 150 tablet 0 08/17/2023 Morning   losartan (COZAAR) 25 MG tablet Take 0.5 tablets (12.5 mg total) by mouth daily. 45 tablet 3 08/17/2023 Morning   metoprolol succinate (TOPROL-XL) 25 MG 24 hr tablet Take 1 tablet (25 mg total) by mouth 2 (two) times daily. 60 tablet 2 08/17/2023 Morning   potassium chloride (KLOR-CON M) 10 MEQ tablet Take 6 tablets (60 mEq total) by mouth daily. (Patient taking differently: Take 60 mEq by mouth 2 (two) times daily.  Take 3 tablets in the morning and 3 tablets in the afternoon) 120 tablet 6 08/17/2023 Noon   simvastatin (ZOCOR) 20 MG tablet Take 1 tablet (20 mg total) by mouth daily. 90 tablet 2 08/17/2023 Morning   tetrahydrozoline 0.05 % ophthalmic solution Place 1 drop into both eyes 3 (three) times daily.   08/17/2023   torsemide (DEMADEX) 100 MG tablet Take 1 tablet (100 mg total) by mouth daily. 90 tablet 3 08/17/2023 Morning   TRELEGY ELLIPTA 100-62.5-25 MCG/ACT AEPB INHALE 1 PUFF ONCE DAILY INTO  THE  LUNGS 60 each 0 08/17/2023 Morning   warfarin (COUMADIN) 5 MG tablet Take one-and-one-half (1 & 1/2) of your 5 mg peach colored warfarin tablets on Mondays and Thursdays. All other days, take only one (1) tablet. (Patient taking differently: Take 5 mg by mouth daily.) 32 tablet 2 08/15/2023   Scheduled:   Chlorhexidine Gluconate Cloth  6 each Topical Daily   fluticasone furoate-vilanterol  1 puff Inhalation Daily   And   umeclidinium bromide  1 puff  Inhalation Daily   insulin aspart  0-15 Units Subcutaneous Q4H   pantoprazole (PROTONIX) IV  40 mg Intravenous Q12H   sodium chloride flush  10-40 mL Intracatheter Q12H   sodium chloride flush  3 mL Intravenous Q12H   spironolactone  25 mg Oral Daily    Assessment: Kim Anthony is a 73 y.o.female with significant past medical history of  HFrEF [LVEF 35-40% 05/11/2022], mechanical mitral valve placement [2001] on chronic warfarin therapy [INR goal 2.5-3.5], paroxymal A-fib, CAD, HTN, HLD, hx breast cancer s/p left mastectomy and radiation/chemo, who presented to ED due to bloody stools and dizziness.   Pharmacy has been consulted to cautiously initiate heparin in setting of mechanical mitral valve replacement. Reportedly still having melenic stools. Discussed with CCM provider, Dr. Celine Mans and Dr. Sherrilee Gilles. Will proceed with IV heparin without bolus, and cautiously titrate to therapeutic level.   S/p RHC 2/28  - EGD on hold for now. Heparin level this morning came back slightly elevated at 0.67, on heparin infusion at 1300 units/hr. Drawn from PICC line where heparin is running - RN did appropriately paused/flush for result. Ordered peripheral stick to confirm given variable results in past - peripheral stick came back subtherapeutic at 0.12. No s/sx of bleeding or infusion issues.   Goal of Therapy:  Heparin level 0.3-0.5 - try to keep on lower end of goal Monitor platelets by anticoagulation protocol: Yes   Plan:  Increase heparin infusion to 1400 units/hr  Order heparin level in 8 hours  Daily heparin level and CBC  Thank you for allowing pharmacy to participate in this patient's care,  Sherron Monday, PharmD, BCCCP Clinical Pharmacist  Phone: [phone] 08/22/2023 7:14 AM  Please check AMION for all [facility] Pharmacy phone numbers After 10:00 PM, call Main Pharmacy [phone]

## 2023-08-22 NOTE — Progress Notes (Signed)
 eLink Physician-Brief Progress Note Patient Name: Kim Anthony DOB: 09-Aug-1950 [name]: [id number]   Date of Service  08/22/2023  HPI/Events of Note  BSRN asking for zyprexa to help pt settle down for the evening, never slept last night  Camera: Confused. On nasal o2. VS stable. Qtc: 0.3, per RN.  On low levo, amiodarone. On heparin at 15.  Agitation, anxiety, confusion. PE, COPD. Anemia.   eICU Interventions  Zyprexa ordered. Asp precautions     Intervention Category Intermediate Interventions: Other:  Ranee Gosselin 08/22/2023, 10:09 PM   01:13 zyprexa has not helped as of yet, RN asking for consideration of repeating dose please, remains so restless. -discussed with RN.  - low dose xanax for anxiety and insomnia.

## 2023-08-22 NOTE — Progress Notes (Signed)
 PHARMACY - ANTICOAGULATION CONSULT NOTE  Pharmacy Consult for heparin Indication:  Mechanical mitral valve replacement  Allergies  Allergen Reactions   Lisinopril Swelling   Acetaminophen-Codeine Itching   Propoxyphene Itching    Darvocet   Tape Rash    Plastic   Tramadol Itching, Nausea And Vomiting and Rash    Patient Measurements: Height: 5\' 5"  (165.1 cm) Weight: 77.9 kg (171 lb 11.8 oz) IBW/kg (Calculated) : 57 Heparin Dosing Weight: TBW  Vital Signs: Temp: 97.8 F (36.6 C) (03/02 1947) Temp Source: Oral (03/02 1947) BP: 126/108 (03/02 1900) Pulse Rate: 85 (03/02 1900)  Labs: Recent Labs    08/21/23 1541 08/21/23 2032 08/22/23 0453 08/22/23 0802 08/22/23 1217 08/22/23 1746 08/22/23 1936  HGB 7.9*  --  7.7*  --   --  8.3*  --   HCT 25.4*  --  24.4*  --   --  25.8*  --   PLT 433*  --  412*  --   --  428*  --   HEPARINUNFRC  --    < > 0.67 0.12*  --   --  0.19*  CREATININE 1.18*  --  1.02*  --  0.93  --  0.97   < > = values in this interval not displayed.    Estimated Creatinine Clearance: 53.3 mL/min (by C-G formula based on SCr of 0.97 mg/dL).   Medical History: Past Medical History:  Diagnosis Date   Arthritis    Asthma    Breast cancer (HCC) 2001   Left Breast Cancer   CHF (congestive heart failure) (HCC)    COPD (chronic obstructive pulmonary disease) (HCC)    Coronary artery disease    GERD (gastroesophageal reflux disease)    History of mitral valve replacement with mechanical valve    HLD (hyperlipidemia)    Hypertension    Pulmonary embolism (HCC) 2021    Medications:  Medications Prior to Admission  Medication Sig Dispense Refill Last Dose/Taking   albuterol (VENTOLIN HFA) 108 (90 Base) MCG/ACT inhaler INHALE 2 PUFFS BY MOUTH EVERY 6 HOURS AS NEEDED FOR WHEEZING FOR SHORTNESS OF BREATH 9 g 0 08/17/2023 Noon   FARXIGA 10 MG TABS tablet Take 1 tablet by mouth once daily 90 tablet 0 08/17/2023 Morning   fluticasone (FLONASE) 50 MCG/ACT  nasal spray Place 2 sprays into both nostrils daily. (Patient taking differently: Place 1 spray into both nostrils daily.) 11.1 mL 3 08/17/2023 Morning   gabapentin (NEURONTIN) 800 MG tablet Take 1 tablet (800 mg total) by mouth 3 (three) times daily. 90 tablet 5 08/17/2023   HYDROcodone-acetaminophen (NORCO/VICODIN) 5-325 MG tablet Take 1 tablet by mouth every 4 (four) hours as needed for moderate pain (pain score 4-6). 150 tablet 0 08/17/2023 Morning   losartan (COZAAR) 25 MG tablet Take 0.5 tablets (12.5 mg total) by mouth daily. 45 tablet 3 08/17/2023 Morning   metoprolol succinate (TOPROL-XL) 25 MG 24 hr tablet Take 1 tablet (25 mg total) by mouth 2 (two) times daily. 60 tablet 2 08/17/2023 Morning   potassium chloride (KLOR-CON M) 10 MEQ tablet Take 6 tablets (60 mEq total) by mouth daily. (Patient taking differently: Take 60 mEq by mouth 2 (two) times daily. Take 3 tablets in the morning and 3 tablets in the afternoon) 120 tablet 6 08/17/2023 Noon   simvastatin (ZOCOR) 20 MG tablet Take 1 tablet (20 mg total) by mouth daily. 90 tablet 2 08/17/2023 Morning   tetrahydrozoline 0.05 % ophthalmic solution Place 1 drop into both eyes 3 (  three) times daily.   08/17/2023   torsemide (DEMADEX) 100 MG tablet Take 1 tablet (100 mg total) by mouth daily. 90 tablet 3 08/17/2023 Morning   TRELEGY ELLIPTA 100-62.5-25 MCG/ACT AEPB INHALE 1 PUFF ONCE DAILY INTO  THE  LUNGS 60 each 0 08/17/2023 Morning   warfarin (COUMADIN) 5 MG tablet Take one-and-one-half (1 & 1/2) of your 5 mg peach colored warfarin tablets on Mondays and Thursdays. All other days, take only one (1) tablet. (Patient taking differently: Take 5 mg by mouth daily.) 32 tablet 2 08/15/2023   Scheduled:   Chlorhexidine Gluconate Cloth  6 each Topical Daily   fluticasone furoate-vilanterol  1 puff Inhalation Daily   And   umeclidinium bromide  1 puff Inhalation Daily   insulin aspart  0-15 Units Subcutaneous Q4H   pantoprazole (PROTONIX) IV  40 mg  Intravenous Q12H   polyethylene glycol  17 g Oral Daily   sennosides  5 mL Per Tube BID   spironolactone  25 mg Oral Daily    Assessment: Kim Anthony is a 73 y.o.female with significant past medical history of  HFrEF [LVEF 35-40% 05/11/2022], mechanical mitral valve placement [2001] on chronic warfarin therapy [INR goal 2.5-3.5], paroxymal A-fib, CAD, HTN, HLD, hx breast cancer s/p left mastectomy and radiation/chemo, who presented to ED due to bloody stools and dizziness.   Pharmacy has been consulted to cautiously initiate heparin in setting of mechanical mitral valve replacement. Reportedly still having melenic stools. Discussed with CCM provider, Dr. Celine Mans and Dr. Sherrilee Gilles. Will proceed with IV heparin without bolus, and cautiously titrate to therapeutic level.   Heparin level this evening below goal at 0.19.  Was drawn from PICC line where heparin running (heparin paused for 15 min prior to draw).  No overt bleeding or complications noted.  Goal of Therapy:  Heparin level 0.3-0.5 - try to keep on lower end of goal Monitor platelets by anticoagulation protocol: Yes   Plan:  Increase heparin infusion to 1500 units/hr  Order heparin level in 8 hours  Daily heparin level and CBC  Thank you for allowing pharmacy to participate in this patient's care,  Reece Leader, Colon Flattery, [facility] Clinical Pharmacist  08/22/2023 9:02 PM   [facility] pharmacy phone numbers are listed on [email or web address]

## 2023-08-22 NOTE — Progress Notes (Signed)
 [facility] ADULT ICU REPLACEMENT PROTOCOL   The patient does apply for the [facility] Adult ICU Electrolyte Replacment Protocol based on the criteria listed below:   1.Exclusion criteria: TCTS, ECMO, Dialysis, and Myasthenia Gravis patients 2. Is GFR >/= 30 ml/min? Yes.    Patient's GFR today is 58 3. Is SCr </= 2? Yes.   Patient's SCr is 1.02 mg/dL 4. Did SCr increase >/= 0.5 in 24 hours? No. 5.Pt's weight >40kg  Yes.   6. Abnormal electrolyte(s): K+ 3.5, Mag 1.9  7. Electrolytes replaced per protocol 8.  Call MD STAT for K+ </= 2.5, Phos </= 1, or Mag </= 1 Physician:  Dr. Stark Bray, Lilia Argue 08/22/2023 6:28 AM

## 2023-08-22 NOTE — Progress Notes (Signed)
 Advanced Heart Failure Rounding Note  Cardiologist: Arvilla Meres, MD  Chief Complaint: RV Failure Subjective:    Swan pulled overnight, 4L urine output with continued improvement in creatinine, pull introducer today.  Remains on low dose NE as well as 0.125 milrinone.  Feeling better, CVP now 12-14. Will continue IV diuresis today, NPO at midnight and will discuss with GI early about procedure tomorrow.    Objective:   Weight Range: 77.9 kg Body mass index is 28.58 kg/m.   Vital Signs:   Temp:  [97.4 F (36.3 C)-98.6 F (37 C)] 97.4 F (36.3 C) (03/02 1131) Pulse Rate:  [72-84] 76 (03/02 1300) Resp:  [12-33] 18 (03/02 1300) BP: (68-140)/(47-93) 109/55 (03/02 1300) SpO2:  [86 %-100 %] 97 % (03/02 1300) Last BM Date : 08/20/23  Weight change: Filed Weights   08/18/23 0356 08/20/23 0500 08/21/23 0600  Weight: 51.2 kg 51.4 kg 77.9 kg   Intake/Output:   Intake/Output Summary (Last 24 hours) at 08/22/2023 1332 Last data filed at 08/22/2023 1300 Gross per 24 hour  Intake 1502.42 ml  Output 5050 ml  Net -3547.58 ml    CVP: 21  Physical Exam    General: Weak appearing. No distress on [name] Cardiac: JVP to jaw. S1 and S2 present. Mechanical click Abdomen: Taut, mildly distended.  Extremities: Warm and dry. 2+ BLE edema.  Neuro: Alert and oriented x3. Affect pleasant. Moves all extremities without difficulty. Lines/Devices:  RUE PICC  Telemetry   SR in 70s (personally reviewed)  EKG    N/A  Labs    CBC Recent Labs    08/21/23 1216 08/21/23 1541 08/22/23 0453  WBC 10.5 10.7* 11.7*  NEUTROABS 6.5  --   --   HGB 8.1* 7.9* 7.7*  HCT 25.6* 25.4* 24.4*  MCV 89.8 91.4 90.0  PLT 454* 433* 412*   Basic Metabolic Panel Recent Labs    78/46/96 0453 08/22/23 1217  NA 130* 130*  K 3.5 3.3*  CL 96* 94*  CO2 24 26  GLUCOSE 126* 151*  BUN 25* 21  CREATININE 1.02* 0.93  CALCIUM 8.4* 8.9  MG 1.9 2.2   Liver Function Tests Recent Labs     08/21/23 0500 08/22/23 0453  AST 32 30  ALT 15 15  ALKPHOS 123 127*  BILITOT 1.4* 1.5*  PROT 5.7* 6.0*  ALBUMIN 2.0* 2.0*   No results for input(s): "LIPASE", "AMYLASE" in the last 72 hours.  Cardiac Enzymes No results for input(s): "CKTOTAL", "CKMB", "CKMBINDEX", "TROPONINI" in the last 72 hours.  BNP: BNP (last 3 results) Recent Labs    01/19/23 1601 07/29/23 1347 08/17/23 2035  BNP 264.0* 374.8* 547.9*   ProBNP (last 3 results) No results for input(s): "PROBNP" in the last 8760 hours.  D-Dimer No results for input(s): "DDIMER" in the last 72 hours. Hemoglobin A1C No results for input(s): "HGBA1C" in the last 72 hours.  Fasting Lipid Panel No results for input(s): "CHOL", "HDL", "LDLCALC", "TRIG", "CHOLHDL", "LDLDIRECT" in the last 72 hours. Thyroid Function Tests No results for input(s): "TSH", "T4TOTAL", "T3FREE", "THYROIDAB" in the last 72 hours.  Invalid input(s): "FREET3"  Other results:    Medications:    Scheduled Medications:  Chlorhexidine Gluconate Cloth  6 each Topical Daily   fluticasone furoate-vilanterol  1 puff Inhalation Daily   And   umeclidinium bromide  1 puff Inhalation Daily   insulin aspart  0-15 Units Subcutaneous Q4H   pantoprazole (PROTONIX) IV  40 mg Intravenous Q12H   polyethylene  glycol  17 g Oral Daily   sennosides  5 mL Per Tube BID   spironolactone  25 mg Oral Daily    Infusions:  sodium chloride Stopped (08/20/23 1159)   amiodarone 30 mg/hr (08/22/23 1300)   furosemide Stopped (08/22/23 1042)   heparin 1,400 Units/hr (08/22/23 1300)   milrinone 0.125 mcg/kg/min (08/22/23 1300)   norepinephrine (LEVOPHED) Adult infusion 4 mcg/min (08/22/23 1300)    PRN Medications: sodium chloride, acetaminophen, clonazePAM, docusate sodium, HYDROmorphone, ondansetron (ZOFRAN) IV, mouth rinse, polyethylene glycol  Patient Profile   Kim Anthony is a 73 y.o. female with a history of mechanical MVR in 2001 in [place], [place], CAD, hx  breast cancer s/p left mastectomy and radiation/chemo, tobacco use, COPD, prior PE/DVT, HTN, undergoing workup for possible inflammatory arthritis with Rheumatology, and systolic CHF.   Assessment/Plan   GIB - EGD clearance Hypotension Blood loss anemia - hypotension with melena requiring RBCs - echo with evidence of RV strain and overload, would diurese to goal CVP 10 prior to EGD  - No melena overnight. - hgb 7.7 - Repeat lasix as below - Continue NE - bleeding management per CCM   2. Acute on chronic RV Failure HFimpEF Pulmonary HTN  - Echo (2017): EF 45-50% in setting of PE. Has mild to moderate RV dysfunction. MV prosthesis okay.  - Over the years has had EF recovery with RV dysfunction with relapses to 35-40% with RV dysfunction in the setting of AF RVR. - R/LHC (7/22): EF 35-40% with 1v CAD mRCA chronically occluded and well compensated hemodynamics with mild pulmonary HTN and normal CO - Suspect likely underlying OSA contributing to PH/RV failure. Also w/ known COPD. - Echo 2/25: EF 50%, mild LVH, RV severely reduced, normal MV prosthesis, moderate to severe TR. - Repeat with EF 55% and D-shaped septum, RV-RA gradient 25 mmHg and severe TR (does not coapt) - NYHA III-IV on admit - Coox 64 this morning,. Continue milrinone/norepinephrine, anticipate will be ready for procedure tomorrow - Remove swan and introducer   3. AKI/hypokalemia - in the setting of RV congestion - Improving with IV diuresis, now normalized at 0.93. - K 3.3, aggressive repletion   4. PAF - She does not do well in AFL/AF. - s/p TEE and DCCV 9/23 to NSR. - On/Off PO amio for prolonged QTc - Recent admit 2/25 with AFL, converted to NSR on amio, but then stopped QTc 510 msec - In NSR on tele - Continue heparin gtt. - continue amio gtt   5. CAD  Elevated Troponin - 1 V CAD with occluded mid RCA on cath (7/22). - hsTrop [id number]. Likely demand ischemia, decongest and follow - EKG without ST  changes - No s/s angina. - on heparin - continue statin    6. Hx of mechanical mitral valve replacement: - Mechanical MVR in [place] in 2001. - Stable on echo 2/25 MV gradient 3 - On heparin with bleed - Continue SBE prophylaxis.   7. Tricuspid valve regurgitation - severe on echo 2/25 - Follow    8. Gout - has been taking NSAIDs and Prednisone at home.   9. Hyponatremia - Hypervolemic hyponatremia, continue diuresis  Length of Stay: 4  CRITICAL CARE Performed by: Romie Minus  Total critical care time: 35 minutes  Critical care time was exclusive of separately billable procedures and treating other patients.  Critical care was necessary to treat or prevent imminent or life-threatening deterioration.  Critical care was time spent personally by me on  the following activities: development of treatment plan with patient and/or surrogate as well as nursing, discussions with consultants, evaluation of patient's response to treatment, examination of patient, obtaining history from patient or surrogate, ordering and performing treatments and interventions, ordering and review of laboratory studies, ordering and review of radiographic studies, pulse oximetry and re-evaluation of patient's condition.  Romie Minus, MD  08/22/2023, 1:32 PM  Advanced Heart Failure Team Pager [phone] (M-F; 7a - 5p)  Please contact [facility] for night-coverage after hours (5p -7a ) and weekends on [email or web address]   CRITICAL CARE Performed by: Romie Minus   Total critical care time: 31 minutes  Critical care time was exclusive of separately billable procedures and treating other patients.  Critical care was necessary to treat or prevent imminent or life-threatening deterioration.  Critical care was time spent personally by me on the following activities: development of treatment plan with patient and/or surrogate as well as nursing, discussions with consultants, evaluation of  patient's response to treatment, examination of patient, obtaining history from patient or surrogate, ordering and performing treatments and interventions, ordering and review of laboratory studies, ordering and review of radiographic studies, pulse oximetry and re-evaluation of patient's condition. [name]  Romie Minus, MD  1:32 PM

## 2023-08-22 NOTE — Progress Notes (Signed)
 NAME:  Kim Anthony, [name]:  [id number], DOB:  March 04, 1951, LOS: 4 ADMISSION DATE:  08/17/2023, CONSULTATION DATE:  08/18/23 REFERRING MD:  Dr. Lily Kocher, CHIEF COMPLAINT:  GI bleed   History of Present Illness:  73 year old female patient with multiple comorbidities as below, most prominently with mechanical mitral valve on chronic Coumadin therapy, A-fib present with lower GI bleed, bloody BMs, dizziness and weakness.   Of note patient has had previous episodes of melena, had colonoscopy in 2019 showing 20 mm polyp in internal hemorrhoids. Patient admits to recent NSAID use for past few weeks BID, has chronic pain, multiple complaints, critical care asked to see this patient due to symptomatic anemia and concern for worsening GI bleed.   Case is discussed with the hospitalist over the phone, is concerned about her IV access she currently has 2 peripheral IVs, we can continue to use this for blood products and IV fluids.  RN notified bedside by myself that if patient's IV access worsens and we will place a central line bedside.  Otherwise, patient can be admitted to the ICU for closer observation.  Pertinent  Medical History   has a past medical history of Arthritis, Asthma, Breast cancer (HCC) (2001), CHF (congestive heart failure) (HCC), COPD (chronic obstructive pulmonary disease) (HCC), Coronary artery disease, GERD (gastroesophageal reflux disease), History of mitral valve replacement with mechanical valve, HLD (hyperlipidemia), Hypertension, and Pulmonary embolism (HCC) (2021).   Significant Hospital Events: Including procedures, antibiotic start and stop dates in addition to other pertinent events   2/25 admitted to IMTS for hypotension and melena likely acute GI bleed, transfer to ICU due to limited IV access and close monitoring s/p 1u PRBC, GI consulted 2/26 PICC placed  2/27 cardiology consulted, coox 59% and CVP elevated    Interim History / Subjective:  Remain afebrile CVP still  elevated Making good amount of urine, net -2 L in last 24 hours On milrinone and Levophed  Objective   Blood pressure (!) 140/56, pulse 75, temperature 97.9 F (36.6 C), temperature source Oral, resp. rate 18, height 5\' 5"  (1.651 m), weight 77.9 kg, SpO2 97%. PAP: (47-62)/(20-30) 61/25 CVP:  [11 mmHg-36 mmHg] 17 mmHg      Intake/Output Summary (Last 24 hours) at 08/22/2023 0920 Last data filed at 08/22/2023 0600 Gross per 24 hour  Intake 1955.36 ml  Output 3500 ml  Net -1544.64 ml   Filed Weights   08/18/23 0356 08/20/23 0500 08/21/23 0600  Weight: 51.2 kg 51.4 kg 77.9 kg    Examination:  General: Chronically ill-appearing female, lying on the bed HEENT: [place]/AT, eyes anicteric.  moist mucus membranes Neuro: Alert, awake following commands Chest: Bilateral faint basal crackles, no wheezes or rhonchi Heart: Regular rate and rhythm, mechanical valve click heard  Abdomen: Soft, nontender, nondistended, bowel sounds present Skin: No rash Extremities: Bilateral pitting edema noted right more than left  Labs and images reviewed  Coox 64% Made 4 L of urine, net -2 L in last 24 hours Serum creatinine trended down  Resolved Hospital Problem list   Lactic acidosis  Assessment & Plan:  Acute upper GI bleeding Acute blood loss anemia on chronic anemia  Patient presented with melena No further episodes of bleeding noted Received 1 unit PRBCs during this admission GI to see her again today Continue Protonix 40 mg twice daily H&H has been stable, currently at 7.7 Avoid NSAIDs  Acute on chronic RV failure with cardiogenic shock HFimpEF (EF 55%) Pulmonary hypertension Advanced heart failure team is  following Continue milrinone at 0.125 and Levophed at 4 mics Continue aggressive diuresis, currently on Lasix 120 mg twice daily Monitor intake and output Remain net -2 L in last 24 hours Made 4 L of urine CVP is 9 today Continue spironolactone Not ready for GDMT considering  shock   AKI on CKD 3a due to cardiorenal syndrome AGMA, improved Hypokalemia/hypomagnesemia Hypervolemic hyponatremia Serum creatinine continue to improve with aggressive diuresis, down to 1.0 Monitor intake and output Avoid nephrotoxic agent Lactate has cleared Continue aggressive electrolyte replacement Serum sodium at 130 Closely monitor electrolytes  Demand cardiac ischemia Prolonged QT EKG showed no ST or T wave changes No chest pain Continue statin Holding antiplatelets agent in the setting of GI bleeding  Paroxysmal A-fib status post cardioversion 9/23 Mechanical mitral valve replacement on Coumadin Remain in sinus rhythm Continue telemetry monitoring Continue amiodarone infusion Continue IV heparin infusion Holding Coumadin for now, in anticipation of EGD for GI bleeding  Shock liver In the setting of cardiogenic shock LFTs have improved Avoid hepatotoxic agent    COPD, not in exacerbation Continue inhalers   Chronic back pain Postherpetic neuralgia Chronic opiate use Anxiety disorder Gout Patient was taking BID NSAID for past few weeks  Continue as needed pain meds Avoid NSAIDs Continue as needed Klonopin, required 1 dose overnight  Prediabetes A1c is 6 Blood sugars are better controlled Continue sliding scale insulin with CBG goal 140-180  Best Practice (right click and "Reselect all SmartList Selections" daily)   Diet/type: Regular consistency DVT prophylaxis systemic heparin Pressure ulcer(s): N/A GI prophylaxis: PPI Lines: PICC, yes still needed Foley:  N/A Code Status:  full code Last date of multidisciplinary goals of care discussion [ updated daughter at bedside 2/28 ]  Labs   CBC: Recent Labs  Lab 08/17/23 2035 08/18/23 0137 08/18/23 0234 08/20/23 1700 08/21/23 0515 08/21/23 1216 08/21/23 1541 08/22/23 0453  WBC 9.4 9.9   < > 11.3* 11.5* 10.5 10.7* 11.7*  NEUTROABS 5.1 5.6  --   --   --  6.5  --   --   HGB 9.4* 8.7*   < >  7.7* 7.8* 8.1* 7.9* 7.7*  HCT 31.2* 29.3*   < > 24.5* 24.3* 25.6* 25.4* 24.4*  MCV 92.9 93.3   < > 92.5 92.4 89.8 91.4 90.0  PLT 419* 434*   < > 440* 430* 454* 433* 412*   < > = values in this interval not displayed.    Basic Metabolic Panel: Recent Labs  Lab 08/18/23 0234 08/19/23 0457 08/19/23 1216 08/19/23 1300 08/20/23 0521 08/20/23 1017 08/21/23 0500 08/21/23 0515 08/21/23 1541 08/22/23 0453  NA 139   < > 139  --  137 138  139 130*  --  134* 130*  K 4.2   < > 3.3*  --  3.7 4.1  4.0 2.8*  --  3.3* 3.5  CL 105   < > 106  --  104  --  98  --  98 96*  CO2 17*   < > 21*  --  21*  --  22  --  23 24  GLUCOSE 64*   < > 98  --  115*  --  185*  --  153* 126*  BUN 58*   < > 57*  --  51*  --  37*  --  31* 25*  CREATININE 4.27*   < > 2.81*  --  2.17*  --  1.47*  --  1.18* 1.02*  CALCIUM 6.8*   < >  7.3*  --  8.4*  --  8.1*  --  8.7* 8.4*  MG 1.4*  --   --  2.1  --   --   --  1.3* 2.0 1.9  PHOS 3.9  --   --   --   --   --   --   --   --   --    < > = values in this interval not displayed.   GFR: Estimated Creatinine Clearance: 50.7 mL/min (A) (by C-G formula based on SCr of 1.02 mg/dL (H)). Recent Labs  Lab 08/18/23 0026 08/18/23 0137 08/18/23 0251 08/18/23 1624 08/21/23 0515 08/21/23 1216 08/21/23 1541 08/22/23 0453  WBC  --    < >  --    < > 11.5* 10.5 10.7* 11.7*  LATICACIDVEN 2.4*  --  2.0*  --   --   --   --   --    < > = values in this interval not displayed.    Liver Function Tests: Recent Labs  Lab 08/18/23 0137 08/19/23 0457 08/19/23 1300 08/20/23 0521 08/21/23 0500 08/22/23 0453  AST 55* 47*  --  43* 32 30  ALT 17 18  --  17 15 15   ALKPHOS 132* 120  --  124 123 127*  BILITOT 1.3* 2.1* 1.6* 1.4* 1.4* 1.5*  PROT 6.0* 6.0*  --  6.2* 5.7* 6.0*  ALBUMIN 2.2* 2.1*  --  2.1* 2.0* 2.0*   Recent Labs  Lab 08/18/23 0212  LIPASE 19  AMYLASE 73   No results for input(s): "AMMONIA" in the last 168 hours.  ABG    Component Value Date/Time   PHART 7.345  (L) 01/09/2021 1341   PCO2ART 44.2 01/09/2021 1341   PO2ART 78 (L) 01/09/2021 1341   HCO3 20.7 08/20/2023 1017   HCO3 21.2 08/20/2023 1017   TCO2 22 08/20/2023 1017   TCO2 22 08/20/2023 1017   ACIDBASEDEF 5.0 (H) 08/20/2023 1017   ACIDBASEDEF 4.0 (H) 08/20/2023 1017   O2SAT 64.1 08/22/2023 0453     Coagulation Profile: Recent Labs  Lab 08/18/23 0137 08/18/23 0758 08/19/23 0457  INR >10.0* 1.6* 1.4*    Cardiac Enzymes: No results for input(s): "CKTOTAL", "CKMB", "CKMBINDEX", "TROPONINI" in the last 168 hours.  HbA1C: Hgb A1c MFr Bld  Date/Time Value Ref Range Status  08/18/2023 02:12 AM 6.0 (H) 4.8 - 5.6 % Final    Comment:    (NOTE) Pre diabetes:          5.7%-6.4%  Diabetes:              >6.4%  Glycemic control for   <7.0% adults with diabetes   09/21/2022 10:24 AM 5.5 4.8 - 5.6 % Final    Comment:    (NOTE)         Prediabetes: 5.7 - 6.4         Diabetes: >6.4         Glycemic control for adults with diabetes: <7.0     CBG: Recent Labs  Lab 08/21/23 1707 08/21/23 2022 08/21/23 2337 08/22/23 0413 08/22/23 0737  GLUCAP 98 133* 125* 112* 105*    Critical care time:     The patient is critically ill due to acute on chronic RV failure with cardiogenic shock/acute GI bleeding.  Critical care was necessary to treat or prevent imminent or life-threatening deterioration.  Critical care was time spent personally by me on the following activities: development of treatment plan with patient and/or surrogate as  well as nursing, discussions with consultants, evaluation of patient's response to treatment, examination of patient, obtaining history from patient or surrogate, ordering and performing treatments and interventions, ordering and review of laboratory studies, ordering and review of radiographic studies, pulse oximetry, re-evaluation of patient's condition and participation in multidisciplinary rounds.   During this encounter critical care time was devoted to  patient care services described in this note for 36 minutes.     Cheri Fowler, MD [facility] See Amion for pager If no response to pager, please call [phone] until 7pm After 7pm, Please call E-link [phone]

## 2023-08-23 ENCOUNTER — Encounter: Payer: 59 | Attending: Physical Medicine and Rehabilitation | Admitting: Physical Medicine and Rehabilitation

## 2023-08-23 ENCOUNTER — Inpatient Hospital Stay (HOSPITAL_COMMUNITY)

## 2023-08-23 DIAGNOSIS — G894 Chronic pain syndrome: Secondary | ICD-10-CM | POA: Insufficient documentation

## 2023-08-23 DIAGNOSIS — K922 Gastrointestinal hemorrhage, unspecified: Secondary | ICD-10-CM | POA: Diagnosis not present

## 2023-08-23 DIAGNOSIS — Z79891 Long term (current) use of opiate analgesic: Secondary | ICD-10-CM | POA: Insufficient documentation

## 2023-08-23 DIAGNOSIS — R570 Cardiogenic shock: Secondary | ICD-10-CM | POA: Diagnosis not present

## 2023-08-23 DIAGNOSIS — I50814 Right heart failure due to left heart failure: Secondary | ICD-10-CM | POA: Diagnosis not present

## 2023-08-23 DIAGNOSIS — Z5181 Encounter for therapeutic drug level monitoring: Secondary | ICD-10-CM | POA: Insufficient documentation

## 2023-08-23 DIAGNOSIS — D62 Acute posthemorrhagic anemia: Secondary | ICD-10-CM | POA: Diagnosis not present

## 2023-08-23 DIAGNOSIS — M5416 Radiculopathy, lumbar region: Secondary | ICD-10-CM | POA: Insufficient documentation

## 2023-08-23 DIAGNOSIS — M25571 Pain in right ankle and joints of right foot: Secondary | ICD-10-CM | POA: Insufficient documentation

## 2023-08-23 DIAGNOSIS — I272 Pulmonary hypertension, unspecified: Secondary | ICD-10-CM | POA: Diagnosis not present

## 2023-08-23 DIAGNOSIS — E8721 Acute metabolic acidosis: Secondary | ICD-10-CM

## 2023-08-23 LAB — URINALYSIS, ROUTINE W REFLEX MICROSCOPIC
Bilirubin Urine: NEGATIVE
Glucose, UA: NEGATIVE mg/dL
Hgb urine dipstick: NEGATIVE
Ketones, ur: NEGATIVE mg/dL
Leukocytes,Ua: NEGATIVE
Nitrite: NEGATIVE
Protein, ur: NEGATIVE mg/dL
Specific Gravity, Urine: 1.012 (ref 1.005–1.030)
pH: 5 (ref 5.0–8.0)

## 2023-08-23 LAB — COMPREHENSIVE METABOLIC PANEL
ALT: 14 U/L (ref 0–44)
ALT: 14 U/L (ref 0–44)
AST: 31 U/L (ref 15–41)
AST: 28 U/L (ref 15–41)
Albumin: 2.1 g/dL — ABNORMAL LOW (ref 3.5–5.0)
Albumin: 2.1 g/dL — ABNORMAL LOW (ref 3.5–5.0)
Alkaline Phosphatase: 127 U/L — ABNORMAL HIGH (ref 38–126)
Alkaline Phosphatase: 127 U/L — ABNORMAL HIGH (ref 38–126)
Anion gap: 12 (ref 5–15)
Anion gap: 13 (ref 5–15)
BUN: 14 mg/dL (ref 8–23)
BUN: 13 mg/dL (ref 8–23)
CO2: 27 mmol/L (ref 22–32)
CO2: 26 mmol/L (ref 22–32)
Calcium: 9.1 mg/dL (ref 8.9–10.3)
Calcium: 8.7 mg/dL — ABNORMAL LOW (ref 8.9–10.3)
Chloride: 92 mmol/L — ABNORMAL LOW (ref 98–111)
Chloride: 92 mmol/L — ABNORMAL LOW (ref 98–111)
Creatinine, Ser: 0.85 mg/dL (ref 0.44–1.00)
Creatinine, Ser: 0.87 mg/dL (ref 0.44–1.00)
GFR, Estimated: >60 mL/min (ref >60)
GFR, Estimated: >60 mL/min (ref >60)
Glucose, Bld: 112 mg/dL — ABNORMAL HIGH (ref 70–99)
Glucose, Bld: 112 mg/dL — ABNORMAL HIGH (ref 70–99)
Potassium: 3.7 mmol/L (ref 3.5–5.1)
Potassium: 3.6 mmol/L (ref 3.5–5.1)
Sodium: 131 mmol/L — ABNORMAL LOW (ref 135–145)
Sodium: 131 mmol/L — ABNORMAL LOW (ref 135–145)
Total Bilirubin: 2 mg/dL — ABNORMAL HIGH (ref 0.0–1.2)
Total Bilirubin: 2.1 mg/dL — ABNORMAL HIGH (ref 0.0–1.2)
Total Protein: 6.5 g/dL (ref 6.5–8.1)
Total Protein: 6.3 g/dL — ABNORMAL LOW (ref 6.5–8.1)

## 2023-08-23 LAB — CBC WITH DIFFERENTIAL/PLATELET
Abs Immature Granulocytes: 0.44 10*3/uL — ABNORMAL HIGH (ref 0.00–0.07)
Basophils Absolute: 0.1 10*3/uL (ref 0.0–0.1)
Basophils Relative: 1 %
Eosinophils Absolute: 0.4 10*3/uL (ref 0.0–0.5)
Eosinophils Relative: 3 %
HCT: 25.9 % — ABNORMAL LOW (ref 36.0–46.0)
Hemoglobin: 8.4 g/dL — ABNORMAL LOW (ref 12.0–15.0)
Immature Granulocytes: 3 %
Lymphocytes Relative: 12 %
Lymphs Abs: 1.9 10*3/uL (ref 0.7–4.0)
MCH: 28.7 pg (ref 26.0–34.0)
MCHC: 32.4 g/dL (ref 30.0–36.0)
MCV: 88.4 fL (ref 80.0–100.0)
Monocytes Absolute: 2.7 10*3/uL — ABNORMAL HIGH (ref 0.1–1.0)
Monocytes Relative: 17 %
Neutro Abs: 10.2 10*3/uL — ABNORMAL HIGH (ref 1.7–7.7)
Neutrophils Relative %: 64 %
Platelets: 434 10*3/uL — ABNORMAL HIGH (ref 150–400)
RBC: 2.93 MIL/uL — ABNORMAL LOW (ref 3.87–5.11)
RDW: 18.1 % — ABNORMAL HIGH (ref 11.5–15.5)
WBC: 15.8 10*3/uL — ABNORMAL HIGH (ref 4.0–10.5)
nRBC: 0.3 % — ABNORMAL HIGH (ref 0.0–0.2)

## 2023-08-23 LAB — COOXEMETRY PANEL
Carboxyhemoglobin: 3 % — ABNORMAL HIGH (ref 0.5–1.5)
Methemoglobin: <0.7 % (ref 0.0–1.5)
O2 Saturation: 66.5 %
Total hemoglobin: 8.7 g/dL — ABNORMAL LOW (ref 12.0–16.0)

## 2023-08-23 LAB — LIPOPROTEIN A (LPA): Lipoprotein (a): 62.6 nmol/L — ABNORMAL HIGH (ref <75.0)

## 2023-08-23 LAB — HEPARIN LEVEL (UNFRACTIONATED)
Heparin Unfractionated: 0.21 [IU]/mL — ABNORMAL LOW (ref 0.30–0.70)
Heparin Unfractionated: 0.15 [IU]/mL — ABNORMAL LOW (ref 0.30–0.70)

## 2023-08-23 LAB — GLUCOSE, CAPILLARY
Glucose-Capillary: 109 mg/dL — ABNORMAL HIGH (ref 70–99)
Glucose-Capillary: 87 mg/dL (ref 70–99)
Glucose-Capillary: 101 mg/dL — ABNORMAL HIGH (ref 70–99)
Glucose-Capillary: 78 mg/dL (ref 70–99)
Glucose-Capillary: 87 mg/dL (ref 70–99)
Glucose-Capillary: 105 mg/dL — ABNORMAL HIGH (ref 70–99)

## 2023-08-23 MED ORDER — QUETIAPINE FUMARATE 25 MG PO TABS
25.0000 mg | ORAL_TABLET | Freq: Two times a day (BID) | ORAL | Status: DC
  Administered 2023-08-23 – 2023-08-26 (×4): 25 mg via ORAL
  Filled 2023-08-23 (×5): qty 1

## 2023-08-23 MED ORDER — OLANZAPINE 5 MG PO TABS
5.0000 mg | ORAL_TABLET | Freq: Once | ORAL | Status: AC
  Administered 2023-08-23: 5 mg via ORAL
  Filled 2023-08-23: qty 1

## 2023-08-23 MED ORDER — SUCRALFATE 1 GM/10ML PO SUSP
1.0000 g | Freq: Three times a day (TID) | ORAL | Status: DC
  Administered 2023-08-23 (×2): 1 g via ORAL
  Filled 2023-08-23 (×4): qty 10

## 2023-08-23 MED ORDER — QUETIAPINE FUMARATE 25 MG PO TABS
25.0000 mg | ORAL_TABLET | Freq: Every day | ORAL | Status: DC
  Filled 2023-08-23: qty 1

## 2023-08-23 MED ORDER — SENNOSIDES 8.8 MG/5ML PO SYRP
5.0000 mL | ORAL_SOLUTION | Freq: Two times a day (BID) | ORAL | Status: DC
  Administered 2023-08-23 – 2023-08-29 (×11): 5 mL via ORAL
  Filled 2023-08-23 (×21): qty 5

## 2023-08-23 MED ORDER — ALPRAZOLAM 0.25 MG PO TABS
0.2500 mg | ORAL_TABLET | Freq: Once | ORAL | Status: AC
  Administered 2023-08-23: 0.25 mg via ORAL
  Filled 2023-08-23: qty 1

## 2023-08-23 MED ORDER — POTASSIUM CHLORIDE 20 MEQ PO PACK
40.0000 meq | PACK | Freq: Once | ORAL | Status: AC
  Administered 2023-08-23: 40 meq via ORAL
  Filled 2023-08-23: qty 2

## 2023-08-23 MED ORDER — POTASSIUM CHLORIDE CRYS ER 20 MEQ PO TBCR: 40.0000 meq | EXTENDED_RELEASE_TABLET | Freq: Once | ORAL | Status: DC

## 2023-08-23 MED ORDER — POTASSIUM CHLORIDE CRYS ER 20 MEQ PO TBCR
40.0000 meq | EXTENDED_RELEASE_TABLET | Freq: Once | ORAL | Status: DC
  Filled 2023-08-23: qty 2

## 2023-08-23 NOTE — Progress Notes (Signed)
 NAME:  Kim Anthony, [name]:  [id number], DOB:  03-05-1951, LOS: 5 ADMISSION DATE:  08/17/2023, CONSULTATION DATE:  08/18/23 REFERRING MD:  Dr. Lily Kocher, CHIEF COMPLAINT:  GI bleed   History of Present Illness:  73 year old female patient with multiple comorbidities as below, most prominently with mechanical mitral valve on chronic Coumadin therapy, A-fib present with lower GI bleed, bloody BMs, dizziness and weakness.   Of note patient has had previous episodes of melena, had colonoscopy in 2019 showing 20 mm polyp in internal hemorrhoids. Patient admits to recent NSAID use for past few weeks BID, has chronic pain, multiple complaints, critical care asked to see this patient due to symptomatic anemia and concern for worsening GI bleed.   Case is discussed with the hospitalist over the phone, is concerned about her IV access she currently has 2 peripheral IVs, we can continue to use this for blood products and IV fluids.  RN notified bedside by myself that if patient's IV access worsens and we will place a central line bedside.  Otherwise, patient can be admitted to the ICU for closer observation.  Pertinent  Medical History   has a past medical history of Arthritis, Asthma, Breast cancer (HCC) (2001), CHF (congestive heart failure) (HCC), COPD (chronic obstructive pulmonary disease) (HCC), Coronary artery disease, GERD (gastroesophageal reflux disease), History of mitral valve replacement with mechanical valve, HLD (hyperlipidemia), Hypertension, and Pulmonary embolism (HCC) (2021).   Significant Hospital Events: Including procedures, antibiotic start and stop dates in addition to other pertinent events   2/25 admitted to IMTS for hypotension and melena likely acute GI bleed, transfer to ICU due to limited IV access and close monitoring s/p 1u PRBC, GI consulted 2/26 PICC placed  2/27 cardiology consulted, coox 59% and CVP elevated 3/3 EGD planned tomorrow on 3/4    Interim History / Subjective:   Anxious, following commands On levophed, milrinone, amio, and heparin   Objective   Blood pressure 104/87, pulse 91, temperature 98 F (36.7 C), temperature source Axillary, resp. rate (!) 31, height 5\' 5"  (1.651 m), weight 73 kg, SpO2 100%. CVP:  [7 mmHg-16 mmHg] 7 mmHg      Intake/Output Summary (Last 24 hours) at 08/23/2023 1204 Last data filed at 08/23/2023 1000 Gross per 24 hour  Intake 1257.36 ml  Output 4050 ml  Net -2792.64 ml   Filed Weights   08/20/23 0500 08/21/23 0600 08/23/23 0400  Weight: 51.4 kg 77.9 kg 73 kg    Examination:  General: acute on chronically ill older adult female, lying in ICU bed, anxious  HEENT: normocephalic, pink moist MM, missing teeth CV: s1,s2, RRR, no JVD, no MRG Pulm: clear, diminished throughout, no distress Abdomen: BS active, soft, intermittent lower quadrant pain  Skin: no rash, lesions Extremities: no generalized edema, no deformity, moves extremities to commands, right knee slightly larger than left- has hx of gout, no redness or pain at this time.  Neuro: alert and oriented x4, anxious, PERRLA intact  GU: intact, purewick    Resolved Hospital Problem list   Lactic acidosis  Assessment & Plan:  Acute upper GI bleeding Acute blood loss anemia on chronic anemia  Patient presented with melena No further episodes of bleeding noted Received 1 unit PRBCs during this admission P: Continue protonix 40mg  BID  Continue to trend h/h, transfuse for hgb < 7  Monitor for signs of bleeding Plan for EGD tomorrow morning on 3/4   Acute on chronic RV failure with cardiogenic shock HFimpEF (EF 55%) Pulmonary  hypertension 4.8 L urine output, negative 3/7 L within 3/2-3/3 Coox 66.6 3/3 P: [facility] team following, appreciate assistance -Continue milrinone at 0.125, and levophed at  -Continue amio and heparin gtt -Hold off diuresis via lasix -Continue spirolactone  Continue monitoring CVP    AKI on CKD 3a due to cardiorenal  syndrome AGMA, improved Hypokalemia/hypomagnesemia Hypervolemic hyponatremia Serum creatinine continuing decrease  Electrolyte derangements slowly improving P: Continue to trend renal function daily  Continue to monitor and optimize electrolytes daily Continue to monitor urine output Continue strict I/Os Continue Adequate renal perfusion  Avoid nephrotoxic agents  Hold diuresis per [facility] team   Demand cardiac ischemia Prolonged QT EKG showed no ST or T wave changes No chest pain P: Continue to hold antiplatelets  Continue to monitor QTC on cardiac monitor   Paroxysmal A-fib status post cardioversion 9/23 Mechanical mitral valve replacement on Coumadin Remain in sinus rhythm Continue telemetry monitoring P: Continue amio gtt Continue heparin gtt  Continue to hold coumadin for now Plan for EGD tomorrow   Shock liver In the setting of cardiogenic shock LFTs have improved P: Continue to trend LFTs intermittently  Continue to avoid hepatotoxic agents     COPD, not in exacerbation P: Continue inhalers: continue breo ellipta, and incruse    Chronic back pain Postherpetic neuralgia Chronic opiate use Anxiety disorder Gout Patient was taking BID NSAID for past few weeks  Received zyprexa and klonopin overnight 3/2  P: Hold off NSAIDs in setting of GI bleed Continue current pain regiment PRN  Add seroquel 25mg  at bedtime  Initiate delirium precautions  PT/OT to evaluate  Up to chair daily   Prediabetes A1c is 6 Blood sugars are better controlled P: Continue SSI, CBG goal 140 to 180    Best Practice (right click and "Reselect all SmartList Selections" daily)   Diet/type: Regular consistency DVT prophylaxis systemic heparin Pressure ulcer(s): N/A GI prophylaxis: PPI Lines: PICC, yes still needed Foley:  N/A Code Status:  full code Last date of multidisciplinary goals of care discussion [ updated daughter at bedside 3/2 ]  Critical care time:     Hazel Sams AGACNP-BC   [facility] 08/23/2023, 1:10 PM  Please see [email or web address] for pager details.  From 7A-7P if no response, please call [phone]. After hours, please call ELink [phone].

## 2023-08-23 NOTE — Progress Notes (Signed)
 eLink Physician-Brief Progress Note Patient Name: Kim Anthony DOB: 1951-04-19 [name]: [id number]   Date of Service  08/23/2023  HPI/Events of Note  Pt agitated, anxious.  Had received seroquel earlier this evening.  Pt reported to have received zyprexa last night with good effect for delirium.   eICU Interventions  One time order for zyprexa 5mg  PO ordered.  Will follow response.         Lizzeth Meder M DELA CRUZ 08/23/2023, 8:32 PM  5:09 AM Notified of K 3.5 Placed order for 40 meq potassium chloride IV

## 2023-08-23 NOTE — Progress Notes (Addendum)
 PHARMACY - ANTICOAGULATION CONSULT NOTE  Pharmacy Consult for heparin Indication:  Mechanical mitral valve replacement  Allergies  Allergen Reactions   Lisinopril Swelling   Acetaminophen-Codeine Itching   Propoxyphene Itching    Darvocet   Tape Rash    Plastic   Tramadol Itching, Nausea And Vomiting and Rash    Patient Measurements: Height: 5\' 5"  (165.1 cm) Weight: 73 kg (160 lb 15 oz) IBW/kg (Calculated) : 57 Heparin Dosing Weight: TBW  Vital Signs: Temp: 97.5 F (36.4 C) (03/03 1130) Temp Source: Axillary (03/03 1130) BP: 101/89 (03/03 1420) Pulse Rate: 89 (03/03 1420)  Labs: Recent Labs    08/22/23 0453 08/22/23 0802 08/22/23 1217 08/22/23 1746 08/22/23 1936 08/23/23 0454 08/23/23 1416  HGB 7.7*  --   --  8.3*  --  8.4*  --   HCT 24.4*  --   --  25.8*  --  25.9*  --   PLT 412*  --   --  428*  --  434*  --   HEPARINUNFRC 0.67   < >  --   --  0.19* 0.21* 0.15*  CREATININE 1.02*  --  0.93  --  0.97 0.85  --    < > = values in this interval not displayed.    Estimated Creatinine Clearance: 59 mL/min (by C-G formula based on SCr of 0.85 mg/dL).   Medical History: Past Medical History:  Diagnosis Date   Arthritis    Asthma    Breast cancer (HCC) 2001   Left Breast Cancer   CHF (congestive heart failure) (HCC)    COPD (chronic obstructive pulmonary disease) (HCC)    Coronary artery disease    GERD (gastroesophageal reflux disease)    History of mitral valve replacement with mechanical valve    HLD (hyperlipidemia)    Hypertension    Pulmonary embolism (HCC) 2021     Assessment: KEYARRA RENDALL is a 73 y.o.female with significant past medical history of  HFrEF [LVEF 35-40% 05/11/2022], mechanical mitral valve placement [2001] on chronic warfarin therapy [INR goal 2.5-3.5], paroxymal A-fib, CAD, HTN, HLD, hx breast cancer s/p left mastectomy and radiation/chemo, who presented to ED due to bloody stools and dizziness.   Pharmacy has been consulted to  cautiously initiate heparin in setting of mechanical mitral valve replacement. Reportedly still having melenic stools. Discussed with CCM provider, Dr. Celine Mans and Dr. Sherrilee Gilles. Will proceed with IV heparin without bolus, and cautiously titrate to therapeutic level.   Heparin level still low, no bleeding issues. Heparin to stop at 0300 prior to EGD tomorrow so will defer recheck.  Goal of Therapy:  Heparin level 0.3-0.5 - try to keep on lower end of goal Monitor platelets by anticoagulation protocol: Yes   Plan:  Increase heparin infusion to 1700 units/hr  Heparin to stop at 0300 on 3/4  Fredonia Highland, PharmD, [place], [facility] Clinical Pharmacist [phone] Please check AMION for all [facility] Pharmacy numbers 08/23/2023

## 2023-08-23 NOTE — Evaluation (Signed)
 Occupational Therapy Evaluation Patient Details Name: Kim Anthony [name]: [id number] DOB: [birth year] Today's Date: 08/23/2023   History of Present Illness   Pt is 73 yo presenting to ED 2/25 with melena and symptomatic anemia in setting of GI bleed and chronic coumadin use. Underwent R heart cath on 2/28 in setting of acute on chronic RV failure and pulmonary hypertension. Plan for EGD 3/4. PMH: gout, CAD, HFrEF, a fib, HTN, GERD, HLD, COPD, hx of breast cancer s/p L mastectomy and radiation/chemo.     Clinical Impressions PTA, pt lives with daughter and reports typically being Modified Independent with ADLs, IADLs and mobility with use of cane vs walker. Pt presents now with deficits in strength, standing balance and endurance. Pt also limited by anxiety today. Overall, pt required Mod A for bed mobility and Mod A x 2 for brief standing with RW at bedside. Pt requires Mod A for UB ADL and up to Total A for LB ADLs d/t deficits. Based on current presentation and decreased caregiver assist at DC, recommend consideration of patient follow up therapy, <3 hours/day at DC.     If plan is discharge home, recommend the following:   A lot of help with walking and/or transfers;Two people to help with walking and/or transfers;A lot of help with bathing/dressing/bathroom;Two people to help with bathing/dressing/bathroom     Functional Status Assessment   Patient has had a recent decline in their functional status and demonstrates the ability to make significant improvements in function in a reasonable and predictable amount of time.     Equipment Recommendations   Other (comment) (TBD pending progress)     Recommendations for Other Services         Precautions/Restrictions   Precautions Precautions: Fall Recall of Precautions/Restrictions: Impaired Restrictions Weight Bearing Restrictions Per Provider Order: No     Mobility Bed Mobility Overal bed mobility: Needs Assistance Bed  Mobility: Sit to Supine, Supine to Sit     Supine to sit: Mod assist, Used rails, HOB elevated Sit to supine: Mod assist   General bed mobility comments: Pt required assist to initiate LE to EOB and lift trunk. use of bed pad to scoot hips to EOB. Assist for BLE back to bed at end of session w/ pt controlling trunk    Transfers Overall transfer level: Needs assistance Equipment used: Rolling walker (2 wheels) Transfers: Sit to/from Stand Sit to Stand: Mod assist, +2 physical assistance, +2 safety/equipment           General transfer comment: difficulty following cues to hold RW to stand w/ Mod A x 2 needed to power up into standing though pt quickly sat back down on bedside before attempting side steps along bedside as planned      Balance Overall balance assessment: Needs assistance Sitting-balance support: No upper extremity supported, Feet supported Sitting balance-Leahy Scale: Fair     Standing balance support: Bilateral upper extremity supported, During functional activity Standing balance-Leahy Scale: Poor                             ADL either performed or assessed with clinical judgement   ADL Overall ADL's : Needs assistance/impaired Eating/Feeding: Set up;Sitting   Grooming: Sitting;Minimal assistance   Upper Body Bathing: Moderate assistance;Sitting   Lower Body Bathing: Maximal assistance;Sit to/from stand;Sitting/lateral leans   Upper Body Dressing : Moderate assistance;Sitting   Lower Body Dressing: Total assistance;Sit to/from stand;Bed level   Toilet Transfer:  Moderate assistance;+2 for physical assistance;+2 for safety/equipment;Stand-pivot;BSC/3in1;Rolling walker (2 wheels)   Toileting- Clothing Manipulation and Hygiene: Total assistance;Bed level               Vision Ability to See in Adequate Light: 0 Adequate Patient Visual Report: No change from baseline Vision Assessment?: No apparent visual deficits     Perception          Praxis         Pertinent Vitals/Pain Pain Assessment Pain Assessment: Faces Faces Pain Scale: Hurts little more Pain Location: legs, back Pain Descriptors / Indicators: Grimacing, Guarding Pain Intervention(s): Monitored during session, Limited activity within patient's tolerance     Extremity/Trunk Assessment Upper Extremity Assessment Upper Extremity Assessment: Generalized weakness;Right hand dominant   Lower Extremity Assessment Lower Extremity Assessment: Defer to PT evaluation   Cervical / Trunk Assessment Cervical / Trunk Assessment: Normal   Communication Communication Communication: No apparent difficulties   Cognition Arousal: Alert Behavior During Therapy: Anxious Cognition: Cognition impaired   Orientation impairments: Situation Awareness: Online awareness impaired Memory impairment (select all impairments): Declarative long-term memory, Working memory Attention impairment (select first level of impairment): Sustained attention Executive functioning impairment (select all impairments): Organization, Sequencing, Reasoning, Problem solving OT - Cognition Comments: pleasant, limited by anxiety with mobility (has been an ongoing issue today and overnight per nursing). able to follow commands, does need redirection cues to attend to tasks, asking if she can lay back down now.                 Following commands: Impaired Following commands impaired: Follows one step commands with increased time, Follows multi-step commands inconsistently     Cueing  General Comments   Cueing Techniques: Verbal cues;Gestural cues  RN present during session and NT entering at end to assist with bed level bath   Exercises     Shoulder Instructions      Home Living Family/patient expects to be discharged to:: Private residence Living Arrangements: Children Available Help at Discharge: Family;Available PRN/intermittently Type of Home: House Home Access: Stairs  to enter [employer] of Steps: 8 Entrance Stairs-Rails: Right;Left;Can reach both Home Layout: One level     Bathroom Shower/Tub: Chief Strategy Officer: Handicapped height Bathroom Accessibility: Yes   Home Equipment: Rollator (4 wheels);Rolling Walker (2 wheels);Cane - single point;BSC/3in1;Shower seat;Grab bars - tub/shower   Additional Comments: daughter works      Prior Functioning/Environment Prior Level of Function : Independent/Modified Independent;Driving             Mobility Comments: Mod I with use of cane vs walker ADLs Comments: Mod I for ADLs and IADLs    OT Problem List: Decreased strength;Decreased activity tolerance;Impaired balance (sitting and/or standing);Decreased cognition   OT Treatment/Interventions: Self-care/ADL training;Therapeutic exercise;Energy conservation;DME and/or AE instruction;Therapeutic activities;Patient/family education;Balance training      OT Goals(Current goals can be found in the care plan section)   Acute Rehab OT Goals Patient Stated Goal: lay back down OT Goal Formulation: With patient Time For Goal Achievement: 09/06/23 Potential to Achieve Goals: Good ADL Goals Pt Will Perform Lower Body Bathing: sit to/from stand;sitting/lateral leans;with min assist Pt Will Perform Lower Body Dressing: with min assist;sitting/lateral leans;sit to/from stand Pt Will Transfer to Toilet: with min assist;ambulating Pt Will Perform Toileting - Clothing Manipulation and hygiene: with min assist;sitting/lateral leans;sit to/from stand   OT Frequency:  Min 1X/week    Co-evaluation  AM-PAC OT "6 Clicks" Daily Activity     Outcome Measure Help from another person eating meals?: A Little Help from another person taking care of personal grooming?: A Little Help from another person toileting, which includes using toliet, bedpan, or urinal?: Total Help from another person bathing (including washing,  rinsing, drying)?: A Lot Help from another person to put on and taking off regular upper body clothing?: A Lot Help from another person to put on and taking off regular lower body clothing?: Total 6 Click Score: 12   End of Session Equipment Utilized During Treatment: Rolling walker (2 wheels) Nurse Communication: Mobility status  Activity Tolerance: Patient limited by fatigue Patient left: in bed;with call bell/phone within reach;with nursing/sitter in room  OT Visit Diagnosis: Unsteadiness on feet (R26.81);Other abnormalities of gait and mobility (R26.89);Muscle weakness (generalized) (M62.81)                Time: 1331-1350 OT Time Calculation (min): 19 min Charges:  OT General Charges $OT Visit: 1 Visit OT Evaluation $OT Eval Moderate Complexity: 1 Mod  Bradd Canary, OTR/L Acute Rehab Services Office: [phone]   Kim Anthony 08/23/2023, 2:07 PM

## 2023-08-23 NOTE — Progress Notes (Signed)
 PHARMACY - ANTICOAGULATION CONSULT NOTE  Pharmacy Consult for heparin Indication:  Mechanical mitral valve replacement  Allergies  Allergen Reactions   Lisinopril Swelling   Acetaminophen-Codeine Itching   Propoxyphene Itching    Darvocet   Tape Rash    Plastic   Tramadol Itching, Nausea And Vomiting and Rash    Patient Measurements: Height: 5\' 5"  (165.1 cm) Weight: 73 kg (160 lb 15 oz) IBW/kg (Calculated) : 57 Heparin Dosing Weight: TBW  Vital Signs: Temp: 98.2 F (36.8 C) (03/02 2320) Temp Source: Axillary (03/02 2320) BP: 87/61 (03/03 0445) Pulse Rate: 95 (03/03 0445)  Labs: Recent Labs    08/22/23 0453 08/22/23 0802 08/22/23 1217 08/22/23 1746 08/22/23 1936 08/23/23 0454  HGB 7.7*  --   --  8.3*  --  8.4*  HCT 24.4*  --   --  25.8*  --  25.9*  PLT 412*  --   --  428*  --  434*  HEPARINUNFRC 0.67 0.12*  --   --  0.19* 0.21*  CREATININE 1.02*  --  0.93  --  0.97  --     Estimated Creatinine Clearance: 51.7 mL/min (by C-G formula based on SCr of 0.97 mg/dL).   Medical History: Past Medical History:  Diagnosis Date   Arthritis    Asthma    Breast cancer (HCC) 2001   Left Breast Cancer   CHF (congestive heart failure) (HCC)    COPD (chronic obstructive pulmonary disease) (HCC)    Coronary artery disease    GERD (gastroesophageal reflux disease)    History of mitral valve replacement with mechanical valve    HLD (hyperlipidemia)    Hypertension    Pulmonary embolism (HCC) 2021     Assessment: Kim Anthony is a 73 y.o.female with significant past medical history of  HFrEF [LVEF 35-40% 05/11/2022], mechanical mitral valve placement [2001] on chronic warfarin therapy [INR goal 2.5-3.5], paroxymal A-fib, CAD, HTN, HLD, hx breast cancer s/p left mastectomy and radiation/chemo, who presented to ED due to bloody stools and dizziness.   Pharmacy has been consulted to cautiously initiate heparin in setting of mechanical mitral valve replacement. Reportedly  still having melenic stools. Discussed with CCM provider, Dr. Celine Mans and Dr. Sherrilee Gilles. Will proceed with IV heparin without bolus, and cautiously titrate to therapeutic level.   Heparin level at 0.21. Was drawn from PICC line where heparin running (heparin paused for 5 min prior to draw).  No overt bleeding or complications per RN  Goal of Therapy:  Heparin level 0.3-0.5 - try to keep on lower end of goal Monitor platelets by anticoagulation protocol: Yes   Plan:  Increase heparin infusion to 1600 units/hr  Order heparin level in 8 hours  Daily heparin level and CBC  Thank you for allowing pharmacy to participate in this patient's care,  [name], PharmD. Clinical Pharmacist 08/23/2023 5:22 AM

## 2023-08-23 NOTE — Progress Notes (Signed)
 [facility] Progress Note  SUBJECTIVE:   Interval history: Kim Anthony was seen and evaluated today at bedside. Resting in bed with daughter, [name], at bedside. Agreeable to EGD tomorrow as heparin not held 6 hours before procedure for today. No nausea or vomiting. Had chest pain this AM. No shortness of breath. Having lower abdominal pain with radiation to her back.   Past Medical History:  Diagnosis Date   Arthritis    Asthma    Breast cancer (HCC) 2001   Left Breast Cancer   CHF (congestive heart failure) (HCC)    COPD (chronic obstructive pulmonary disease) (HCC)    Coronary artery disease    GERD (gastroesophageal reflux disease)    History of mitral valve replacement with mechanical valve    HLD (hyperlipidemia)    Hypertension    Pulmonary embolism (HCC) 2021   Past Surgical History:  Procedure Laterality Date   APPLICATION OF A-CELL OF CHEST/ABDOMEN Left 01/27/2019   Procedure: APPLICATION OF A-CELL OF CHEST;  Surgeon: Peggye Form, DO;  Location: [facility] OR;  Service: Plastics;  Laterality: Left;   APPLICATION OF WOUND VAC N/A 01/27/2019   Procedure: APPLICATION OF WOUND VAC;  Surgeon: Peggye Form, DO;  Location: [facility] OR;  Service: Plastics;  Laterality: N/A;   BREAST SURGERY Left 2001   for CA   CARDIOVERSION N/A 02/26/2022   Procedure: CARDIOVERSION;  Surgeon: Little Ishikawa, MD;  Location: [facility] ENDOSCOPY;  Service: Cardiovascular;  Laterality: N/A;   COLONOSCOPY WITH PROPOFOL N/A 06/07/2018   Procedure: COLONOSCOPY WITH PROPOFOL;  Surgeon: Charlott Rakes, MD;  Location: [facility] ENDOSCOPY;  Service: Endoscopy;  Laterality: N/A;   DEBRIDEMENT AND CLOSURE WOUND Left 12/28/2018   Procedure: Debridement And Closure Wound;  Surgeon: Abigail Miyamoto, MD;  Location: [facility] OR;  Service: General;  Laterality: Left;   INCISION AND DRAINAGE OF WOUND Left 01/27/2019   Procedure: IRRIGATION AND DEBRIDEMENT OF CHEST WALL;  Surgeon: Peggye Form,  DO;  Location: [facility] OR;  Service: Plastics;  Laterality: Left;   INTRAMEDULLARY (IM) NAIL INTERTROCHANTERIC N/A 09/23/2022   Procedure: INTRAMEDULLARY (IM) NAIL INTERTROCHANTERIC;  Surgeon: Tarry Kos, MD;  Location: [facility] OR;  Service: Orthopedics;  Laterality: N/A;   KNEE ARTHROSCOPY WITH MEDIAL MENISECTOMY Left 08/06/2021   Procedure: KNEE ARTHROSCOPY WITH PARTIAL MEDIAL MENISECTOMY, DEBRIDEMENT;  Surgeon: Jene Every, MD;  Location: [facility] ORS;  Service: Orthopedics;  Laterality: Left;  1 HR   LATISSIMUS FLAP TO BREAST Left 01/18/2019   Procedure: LEFT LATISSIMUS FLAP TO BREAST;  Surgeon: Peggye Form, DO;  Location: [facility] OR;  Service: Plastics;  Laterality: Left;   MASTECTOMY Left    MITRAL VALVE REPLACEMENT     mechanical   POLYPECTOMY  06/07/2018   Procedure: POLYPECTOMY;  Surgeon: Charlott Rakes, MD;  Location: [facility] ENDOSCOPY;  Service: Endoscopy;;   RIGHT HEART CATH N/A 08/20/2023   Procedure: RIGHT HEART CATH;  Surgeon: Dolores Patty, MD;  Location: [facility] INVASIVE CV LAB;  Service: Cardiovascular;  Laterality: N/A;   RIGHT/LEFT HEART CATH AND CORONARY ANGIOGRAPHY N/A 01/09/2021   Procedure: RIGHT/LEFT HEART CATH AND CORONARY ANGIOGRAPHY;  Surgeon: Dolores Patty, MD;  Location: [facility] INVASIVE CV LAB;  Service: Cardiovascular;  Laterality: N/A;   WOUND DEBRIDEMENT Left 12/28/2018   Procedure: EXCISION OF LEFT CHRONIC CHEST WALL WOUND;  Surgeon: Abigail Miyamoto, MD;  Location: [facility] OR;  Service: General;  Laterality: Left;   Current Facility-Administered Medications  Medication Dose Route Frequency Provider Last Rate Last Admin  0.9 %  sodium chloride infusion (Manually program via Guardrails IV Fluids)   Intravenous Continuous Bensimhon, Bevelyn Buckles, MD   Held at 08/20/23 1159   0.9 %  sodium chloride infusion (Manually program via Guardrails IV Fluids)   Intravenous PRN Bensimhon, Bevelyn Buckles, MD       acetaminophen (TYLENOL) tablet 650 mg  650 mg Oral Q4H PRN Bensimhon, Bevelyn Buckles,  MD   650 mg at 08/21/23 1933   amiodarone (NEXTERONE PREMIX) 360-4.14 MG/200ML-% (1.8 mg/mL) IV infusion  30 mg/hr Intravenous Continuous Bensimhon, Bevelyn Buckles, MD 16.67 mL/hr at 08/23/23 0800 30 mg/hr at 08/23/23 0800   Chlorhexidine Gluconate Cloth 2 % PADS 6 each  6 each Topical Daily Bensimhon, Bevelyn Buckles, MD   6 each at 08/22/23 0944   clonazePAM (KLONOPIN) tablet 0.5 mg  0.5 mg Oral BID PRN Cheri Fowler, MD   0.5 mg at 08/22/23 1932   docusate sodium (COLACE) capsule 100 mg  100 mg Oral BID PRN Bensimhon, Bevelyn Buckles, MD       fluticasone furoate-vilanterol (BREO ELLIPTA) 100-25 MCG/ACT 1 puff  1 puff Inhalation Daily Bensimhon, Bevelyn Buckles, MD   1 puff at 08/23/23 0758   And   umeclidinium bromide (INCRUSE ELLIPTA) 62.5 MCG/ACT 1 puff  1 puff Inhalation Daily Bensimhon, Bevelyn Buckles, MD   1 puff at 08/23/23 0758   heparin ADULT infusion 100 units/mL (25000 units/273mL)  1,600 Units/hr Intravenous Continuous [name], RPH 16 mL/hr at 08/23/23 0800 1,600 Units/hr at 08/23/23 0800   HYDROmorphone (DILAUDID) tablet 1 mg  1 mg Oral Q4H PRN Romie Minus, MD   1 mg at 08/22/23 1932   insulin aspart (novoLOG) injection 0-15 Units  0-15 Units Subcutaneous Q4H Bensimhon, Bevelyn Buckles, MD   2 Units at 08/21/23 2345   milrinone (PRIMACOR) 20 MG/100 ML (0.2 mg/mL) infusion  0.125 mcg/kg/min Intravenous Continuous Bensimhon, Bevelyn Buckles, MD 1.93 mL/hr at 08/23/23 0800 0.125 mcg/kg/min at 08/23/23 0800   norepinephrine (LEVOPHED) 4mg  in (0.016 mg/mL) premix infusion  2-20 mcg/min Intravenous Titrated Andrey Farmer, PA-C 7.5 mL/hr at 08/23/23 0800 2 mcg/min at 08/23/23 0800   ondansetron (ZOFRAN) injection 4 mg  4 mg Intravenous Q6H PRN Bensimhon, Bevelyn Buckles, MD   4 mg at 08/18/23 5409   Oral care mouth rinse  15 mL Mouth Rinse PRN Bensimhon, Bevelyn Buckles, MD       pantoprazole (PROTONIX) injection 40 mg  40 mg Intravenous Q12H Bensimhon, Bevelyn Buckles, MD   40 mg at 08/22/23 2111   polyethylene glycol (MIRALAX /  GLYCOLAX) packet 17 g  17 g Oral Daily PRN Bensimhon, Bevelyn Buckles, MD       polyethylene glycol (MIRALAX / GLYCOLAX) packet 17 g  17 g Oral Daily Cheri Fowler, MD   17 g at 08/22/23 1153   potassium chloride SA (KLOR-CON M) CR tablet 40 mEq  40 mEq Oral Once Andrey Farmer, PA-C       sennosides (SENOKOT) 8.8 MG/5ML syrup 5 mL  5 mL Per Tube BID Romie Minus, MD   5 mL at 08/22/23 2111   spironolactone (ALDACTONE) tablet 25 mg  25 mg Oral Daily Romie Minus, MD   25 mg at 08/22/23 0912   Allergies as of 08/17/2023 - Review Complete 08/17/2023  Allergen Reaction Noted   Lisinopril Swelling 01/07/2021   Acetaminophen-codeine Itching 07/10/2015   Propoxyphene Itching 01/18/2019   Tape Rash 03/09/2019   Tramadol Itching, Nausea And Vomiting, and Rash  07/10/2015   Review of Systems:  Review of Systems  Respiratory:  Negative for shortness of breath.   Cardiovascular:  Positive for chest pain.  Gastrointestinal:  Positive for abdominal pain. Negative for nausea and vomiting.    OBJECTIVE:   Temp:  [97.4 F (36.3 C)-98.2 F (36.8 C)] 98 F (36.7 C) (03/03 0730) Pulse Rate:  [72-100] 86 (03/03 0815) Resp:  [12-40] 18 (03/03 0815) BP: (81-134)/(42-114) 106/61 (03/03 0800) SpO2:  [85 %-100 %] 100 % (03/03 0815) Weight:  [73 kg] 73 kg (03/03 0400) Last BM Date : 08/20/23 Physical Exam Constitutional:      General: She is not in acute distress.    Appearance: She is not ill-appearing, toxic-appearing or diaphoretic.  Cardiovascular:     Rate and Rhythm: Normal rate and regular rhythm.     Heart sounds: Murmur heard.  Pulmonary:     Effort: No respiratory distress.     Breath sounds: Normal breath sounds.  Abdominal:     General: Bowel sounds are normal. There is no distension.     Palpations: Abdomen is soft.     Tenderness: There is abdominal tenderness. There is no guarding.  Skin:    General: Skin is warm and dry.  Neurological:     Mental Status: She is  alert.     Labs: Recent Labs    08/22/23 0453 08/22/23 1746 08/23/23 0454  WBC 11.7* 12.5* 15.8*  HGB 7.7* 8.3* 8.4*  HCT 24.4* 25.8* 25.9*  PLT 412* 428* 434*   BMET Recent Labs    08/22/23 1217 08/22/23 1936 08/23/23 0454  NA 130* 131* 131*  K 3.3* 3.4* 3.7  CL 94* 93* 92*  CO2 26 29 27   GLUCOSE 151* 114* 112*  BUN 21 18 14   CREATININE 0.93 0.97 0.85  CALCIUM 8.9 8.8* 9.1   LFT Recent Labs    08/23/23 0454  PROT 6.5  ALBUMIN 2.1*  AST 31  ALT 14  ALKPHOS 127*  BILITOT 2.0*   PT/INR No results for input(s): "LABPROT", "INR" in the last 72 hours. Diagnostic imaging: No results found.  IMPRESSION: Melena Anemia Supratherapeutic INR NSAID use Congestive heart failure Pulmonary hypertension, mild COPD Coronary artery disease Mitral valve replacement   PLAN: -Recommend EGD to evaluate melena, anemia, NSAID use -Discussed procedure benefits, alternatives, risks of bleeding/infection/perforation/missed lesion/anesthesia with patient and her daughter at bedside, agreeable to proceed with EGD -NPO at midnight for EGD 08/24/23 at 0900 in endoscopy suite -Plan to hold heparin 6 hours before EGD (roughly 0300) -Continue IV PPI Q12Hr -Start sucralfate suspension 1 gm PO QID for pain relief -Avoid all NSAIDs -Further recommendations to follow pending procedure   LOS: 5 days   Liliane Shi, DO [facility]

## 2023-08-23 NOTE — Progress Notes (Addendum)
 Advanced Heart Failure Rounding Note  Cardiologist: Arvilla Meres, MD   Chief Complaint: RV Failure  Subjective:    -Complaining of LLQ / adnexal pain -Intermittently delirious -4.8L urine output q24h, negative 3.7L  Objective:   Weight Range: 73 kg Body mass index is 26.78 kg/m.   Vital Signs:   Temp:  [97.4 F (36.3 C)-98.2 F (36.8 C)] 98.2 F (36.8 C) (03/02 2320) Pulse Rate:  [72-100] 87 (03/03 0645) Resp:  [12-40] 22 (03/03 0645) BP: (81-134)/(42-114) 96/58 (03/03 0645) SpO2:  [85 %-100 %] 100 % (03/03 0645) Weight:  [73 kg] 73 kg (03/03 0400) Last BM Date : 08/20/23  Weight change: Filed Weights   08/20/23 0500 08/21/23 0600 08/23/23 0400  Weight: 51.4 kg 77.9 kg 73 kg   Intake/Output:   Intake/Output Summary (Last 24 hours) at 08/23/2023 0739 Last data filed at 08/23/2023 0600 Gross per 24 hour  Intake 1088.99 ml  Output 4850 ml  Net -3761.01 ml    CVP: 7-8  Physical Exam    General: chronically ill Cardiac: JVP 8 Abdomen: soft; tender at LLQ Extremities: Warm and dry. Trace pitting edema Neuro: Alert and oriented x3. Affect pleasant. Moves all extremities without difficulty. Lines/Devices:  RUE PICC  Telemetry   Sinus 80s  EKG    N/A  Labs    CBC Recent Labs    08/21/23 1216 08/21/23 1541 08/22/23 1746 08/23/23 0454  WBC 10.5   < > 12.5* 15.8*  NEUTROABS 6.5  --   --  10.2*  HGB 8.1*   < > 8.3* 8.4*  HCT 25.6*   < > 25.8* 25.9*  MCV 89.8   < > 89.3 88.4  PLT 454*   < > 428* 434*   < > = values in this interval not displayed.   Basic Metabolic Panel Recent Labs    14/78/29 1217 08/22/23 1936 08/23/23 0454  NA 130* 131* 131*  K 3.3* 3.4* 3.7  CL 94* 93* 92*  CO2 26 29 27   GLUCOSE 151* 114* 112*  BUN 21 18 14   CREATININE 0.93 0.97 0.85  CALCIUM 8.9 8.8* 9.1  MG 2.2 1.7  --    Liver Function Tests Recent Labs    08/22/23 1936 08/23/23 0454  AST 31 31  ALT 14 14  ALKPHOS 133* 127*  BILITOT 1.5* 2.0*   PROT 6.5 6.5  ALBUMIN 2.2* 2.1*   No results for input(s): "LIPASE", "AMYLASE" in the last 72 hours.  Cardiac Enzymes No results for input(s): "CKTOTAL", "CKMB", "CKMBINDEX", "TROPONINI" in the last 72 hours.  BNP: BNP (last 3 results) Recent Labs    01/19/23 1601 07/29/23 1347 08/17/23 2035  BNP 264.0* 374.8* 547.9*   Other results:    Medications:    Scheduled Medications:  Chlorhexidine Gluconate Cloth  6 each Topical Daily   fluticasone furoate-vilanterol  1 puff Inhalation Daily   And   umeclidinium bromide  1 puff Inhalation Daily   insulin aspart  0-15 Units Subcutaneous Q4H   pantoprazole (PROTONIX) IV  40 mg Intravenous Q12H   polyethylene glycol  17 g Oral Daily   sennosides  5 mL Per Tube BID   spironolactone  25 mg Oral Daily    Infusions:  sodium chloride Stopped (08/20/23 1159)   amiodarone 30 mg/hr (08/23/23 0658)   heparin 1,600 Units/hr (08/23/23 0600)   milrinone 0.125 mcg/kg/min (08/23/23 0600)   norepinephrine (LEVOPHED) Adult infusion 2 mcg/min (08/23/23 0600)    PRN Medications: sodium chloride, acetaminophen,  clonazePAM, docusate sodium, HYDROmorphone, ondansetron (ZOFRAN) IV, mouth rinse, polyethylene glycol  Patient Profile   Kim Anthony is a 73 y.o. female with a history of mechanical MVR in 2001 in [place], [place], CAD, hx breast cancer s/p left mastectomy and radiation/chemo, tobacco use, COPD, prior PE/DVT, HTN, undergoing workup for possible inflammatory arthritis with Rheumatology, and systolic CHF.   Assessment/Plan   GIB - EGD clearance Hypotension Blood loss anemia - hypotension with melena requiring RBCs - Hgb 8.4; stable.  - EGD deferred to tomorrow due to heparin gtt.  - Continue NE & milrinone; will D/C post-procedure. - bleeding management per CCM   2. Acute on chronic RV Failure HFimpEF Pulmonary HTN  - Suspect likely underlying OSA contributing to PH/RV failure. Also w/ known COPD. - Repeat with EF 55% and  D-shaped septum, RV-RA gradient 25 mmHg and severe TR (does not coapt) - NYHA III-IV on admit - Mixed venous 67% today on milrinone 0.156mcg/kg/min & levophed ; will plan to D/C after EGD tomorrow.  - CVP 7 today; significant improvement in lower extremity edema; hold PM diuretics. Start PO tomorrow.    3. AKI/hypokalemia - in the setting of RV congestion - resolved.    4. PAF - She does not do well in AFL/AF. - s/p TEE and DCCV 9/23 to NSR. - On/Off PO amio for prolonged QTc - Recent admit 2/25 with AFL, converted to NSR on amio, but then stopped QTc 510 msec - In NSR on tele - Continue heparin gtt. - continue amio gtt   5. CAD  Elevated Troponin - 1 V CAD with occluded mid RCA on cath (7/22). - hsTrop [id number]. Likely demand ischemia, decongest and follow - EKG without ST changes - No s/s angina. - on heparin - continue statin    6. Hx of mechanical mitral valve replacement: - Mechanical MVR in [place] in 2001. - Stable on echo 2/25 MV gradient 3 - On heparin with bleed - Continue SBE prophylaxis.   7. Tricuspid valve regurgitation - severe on echo 2/25 - Follow    8. Gout - has been taking NSAIDs and Prednisone at home.   9. Hyponatremia - Hypervolemic hyponatremia, continue diuresis  10. LLQ pain - KUB - urinalysis - ?diverticulitis - Rising WBC ct; low threshold to start abx  Length of Stay: 5  [name] 10:09 AM  CRITICAL CARE Performed by: Dorthula Nettles   Total critical care time: 35 minutes  Critical care time was exclusive of separately billable procedures and treating other patients.  Critical care was necessary to treat or prevent imminent or life-threatening deterioration.  Critical care was time spent personally by me on the following activities: development of treatment plan with patient and/or surrogate as well as nursing, discussions with consultants, evaluation of patient's response to treatment, examination of  patient, obtaining history from patient or surrogate, ordering and performing treatments and interventions, ordering and review of laboratory studies, ordering and review of radiographic studies, pulse oximetry and re-evaluation of patient's condition.   Advanced Heart Failure Team Pager [phone] (M-F; 7a - 5p)  Please contact [facility] for night-coverage after hours (5p -7a ) and weekends on [email or web address]

## 2023-08-23 NOTE — Progress Notes (Deleted)
 Subjective:    Patient ID: Kim Anthony, female    DOB: Nov 08, 1950, 73 y.o.   [name]: [id number]  HPI   Pain Inventory Average Pain {NUMBERS; 0-10:5044} Pain Right Now {NUMBERS; 0-10:5044} My pain is {PAIN DESCRIPTION:21022940}  In the last 24 hours, has pain interfered with the following? General activity {NUMBERS; 0-10:5044} Relation with others {NUMBERS; 0-10:5044} Enjoyment of life {NUMBERS; 0-10:5044} What TIME of day is your pain at its worst? {time of day:24191} Sleep (in general) {BHH GOOD/FAIR/POOR:22877}  Pain is worse with: {ACTIVITIES:21022942} Pain improves with: {PAIN IMPROVES NWGN:56213086} Relief from Meds: {NUMBERS; 0-10:5044}  Family History  Problem Relation Age of Onset   Hypertension Mother    Cancer Father    Hypertension Father    Breast cancer Maternal Aunt 50   Heart attack Other    Social History   Socioeconomic History   Marital status: Single    Spouse name: Not on file   Number of children: Not on file   Years of education: Not on file   Highest education level: Not on file  Occupational History   Not on file  Tobacco Use   Smoking status: Every Day    Current packs/day: 0.10    Average packs/day: 0.1 packs/day for 30.0 years (3.0 ttl pk-yrs)    Types: Cigarettes   Smokeless tobacco: Never   Tobacco comments:    5 cigs per day  Vaping Use   Vaping status: Never Used  Substance and Sexual Activity   Alcohol use: Yes    Alcohol/week: 3.0 standard drinks of alcohol    Types: 3 Standard drinks or equivalent per week    Comment: beer- ocasional   Drug use: No   Sexual activity: Not Currently    Birth control/protection: None  Other Topics Concern   Not on file  Social History Narrative   ** Merged History Encounter **       Social Drivers of Health   Financial Resource Strain: Low Risk  (12/01/2022)   Overall Financial Resource Strain (CARDIA)    Difficulty of Paying Living Expenses: Not very hard  Food Insecurity: No Food  Insecurity (08/18/2023)   Hunger Vital Sign    Worried About Running Out of Food in the Last Year: Never true    Ran Out of Food in the Last Year: Never true  Transportation Needs: No Transportation Needs (08/18/2023)   PRAPARE - Administrator, Civil Service (Medical): No    Lack of Transportation (Non-Medical): No  Physical Activity: Inactive (12/01/2022)   Exercise Vital Sign    Days of Exercise per Week: 0 days    Minutes of Exercise per Session: 10 min  Stress: Stress Concern Present (12/01/2022)   [employer] of Occupational Health - Occupational Stress Questionnaire    Feeling of Stress : To some extent  Social Connections: Patient Declined (08/18/2023)   Social Connection and Isolation Panel [NHANES]    Frequency of Communication with Friends and Family: Patient declined    Frequency of Social Gatherings with Friends and Family: Patient declined    Attends Religious Services: Patient declined    Database administrator or Organizations: Patient declined    Attends Banker Meetings: Patient declined    Marital Status: Patient declined   Past Surgical History:  Procedure Laterality Date   APPLICATION OF A-CELL OF CHEST/ABDOMEN Left 01/27/2019   Procedure: APPLICATION OF A-CELL OF CHEST;  Surgeon: Peggye Form, DO;  Location: [facility] OR;  Service:  Plastics;  Laterality: Left;   APPLICATION OF WOUND VAC N/A 01/27/2019   Procedure: APPLICATION OF WOUND VAC;  Surgeon: Peggye Form, DO;  Location: [facility] OR;  Service: Plastics;  Laterality: N/A;   BREAST SURGERY Left 2001   for CA   CARDIOVERSION N/A 02/26/2022   Procedure: CARDIOVERSION;  Surgeon: Little Ishikawa, MD;  Location: [facility] ENDOSCOPY;  Service: Cardiovascular;  Laterality: N/A;   COLONOSCOPY WITH PROPOFOL N/A 06/07/2018   Procedure: COLONOSCOPY WITH PROPOFOL;  Surgeon: Charlott Rakes, MD;  Location: [facility] ENDOSCOPY;  Service: Endoscopy;  Laterality: N/A;   DEBRIDEMENT AND CLOSURE  WOUND Left 12/28/2018   Procedure: Debridement And Closure Wound;  Surgeon: Abigail Miyamoto, MD;  Location: [facility] OR;  Service: General;  Laterality: Left;   INCISION AND DRAINAGE OF WOUND Left 01/27/2019   Procedure: IRRIGATION AND DEBRIDEMENT OF CHEST WALL;  Surgeon: Peggye Form, DO;  Location: [facility] OR;  Service: Plastics;  Laterality: Left;   INTRAMEDULLARY (IM) NAIL INTERTROCHANTERIC N/A 09/23/2022   Procedure: INTRAMEDULLARY (IM) NAIL INTERTROCHANTERIC;  Surgeon: Tarry Kos, MD;  [facility]: [facility] OR;  Service: Orthopedics;  Laterality: N/A;   KNEE ARTHROSCOPY WITH MEDIAL MENISECTOMY Left 08/06/2021   Procedure: KNEE ARTHROSCOPY WITH PARTIAL MEDIAL MENISECTOMY, DEBRIDEMENT;  Surgeon: Jene Every, MD;  Location: [facility] ORS;  Service: Orthopedics;  Laterality: Left;  1 HR   LATISSIMUS FLAP TO BREAST Left 01/18/2019   Procedure: LEFT LATISSIMUS FLAP TO BREAST;  Surgeon: Peggye Form, DO;  [facility]: [facility] OR;  Service: Plastics;  Laterality: Left;   MASTECTOMY Left    MITRAL VALVE REPLACEMENT     mechanical   POLYPECTOMY  06/07/2018   Procedure: POLYPECTOMY;  Surgeon: Charlott Rakes, MD;  Location: [facility] ENDOSCOPY;  Service: Endoscopy;;   RIGHT HEART CATH N/A 08/20/2023   Procedure: RIGHT HEART CATH;  Surgeon: Dolores Patty, MD;  [facility] INVASIVE CV LAB;  Service: Cardiovascular;  Laterality: N/A;   RIGHT/LEFT HEART CATH AND CORONARY ANGIOGRAPHY N/A 01/09/2021   Procedure: RIGHT/LEFT HEART CATH AND CORONARY ANGIOGRAPHY;  Surgeon: Dolores Patty, MD;  [facility];  Service: Cardiovascular;  Laterality: N/A;   WOUND DEBRIDEMENT Left 12/28/2018   Procedure: EXCISION OF LEFT CHRONIC CHEST WALL WOUND;  Surgeon: Abigail Miyamoto, MD;  [facility];  Service: General;  Laterality: Left;   Past Surgical History:  Procedure Laterality Date   APPLICATION OF A-CELL OF CHEST/ABDOMEN Left 01/27/2019   Procedure: APPLICATION OF A-CELL OF CHEST;  Surgeon:  Peggye Form, DO;  [facility];  Service: Plastics;  Laterality: Left;   APPLICATION OF WOUND VAC N/A 01/27/2019   Procedure: APPLICATION OF WOUND VAC;  Surgeon: Peggye Form, DO;  [facility];  Service: Plastics;  Laterality: N/A;   BREAST SURGERY Left 2001   for CA   CARDIOVERSION N/A 02/26/2022   Procedure: CARDIOVERSION;  Surgeon: Little Ishikawa, MD;  Location: [facility] ENDOSCOPY;  Service: Cardiovascular;  Laterality: N/A;   COLONOSCOPY WITH PROPOFOL N/A 06/07/2018   Procedure: COLONOSCOPY WITH PROPOFOL;  Surgeon: Charlott Rakes, MD;  [facility] ENDOSCOPY;  Service: Endoscopy;  Laterality: N/A;   DEBRIDEMENT AND CLOSURE WOUND Left 12/28/2018   Procedure: Debridement And Closure Wound;  Surgeon: Abigail Miyamoto, MD;  Location: [facility] OR;  Service: General;  Laterality: Left;   INCISION AND DRAINAGE OF WOUND Left 01/27/2019   Procedure: IRRIGATION AND DEBRIDEMENT OF CHEST WALL;  Surgeon: Peggye Form, DO;  [facility];  Service: Plastics;  Laterality: Left;  INTRAMEDULLARY (IM) NAIL INTERTROCHANTERIC N/A 09/23/2022   Procedure: INTRAMEDULLARY (IM) NAIL INTERTROCHANTERIC;  Surgeon: Tarry Kos, MD;  [facility];  Service: Orthopedics;  Laterality: N/A;   KNEE ARTHROSCOPY WITH MEDIAL MENISECTOMY Left 08/06/2021   Procedure: KNEE ARTHROSCOPY WITH PARTIAL MEDIAL MENISECTOMY, DEBRIDEMENT;  Surgeon: Jene Every, MD;  [facility]: [facility] ORS;  Service: Orthopedics;  Laterality: Left;  1 HR   LATISSIMUS FLAP TO BREAST Left 01/18/2019   Procedure: LEFT LATISSIMUS FLAP TO BREAST;  Surgeon: Peggye Form, DO;  [facility];  Service: Plastics;  Laterality: Left;   MASTECTOMY Left    MITRAL VALVE REPLACEMENT     mechanical   POLYPECTOMY  06/07/2018   Procedure: POLYPECTOMY;  Surgeon: Charlott Rakes, MD;  Location: [facility] ENDOSCOPY;  Service: Endoscopy;;   RIGHT HEART CATH N/A 08/20/2023   Procedure: RIGHT HEART CATH;  Surgeon: Dolores Patty,  MD;  [facility];  Service: Cardiovascular;  Laterality: N/A;   RIGHT/LEFT HEART CATH AND CORONARY ANGIOGRAPHY N/A 01/09/2021   Procedure: RIGHT/LEFT HEART CATH AND CORONARY ANGIOGRAPHY;  Surgeon: Dolores Patty, MD;  [facility];  Service: Cardiovascular;  Laterality: N/A;   WOUND DEBRIDEMENT Left 12/28/2018   Procedure: EXCISION OF LEFT CHRONIC CHEST WALL WOUND;  Surgeon: Abigail Miyamoto, MD;  [facility];  Service: General;  Laterality: Left;   Past Medical History:  Diagnosis Date   Arthritis    Asthma    Breast cancer (HCC) 2001   Left Breast Cancer   CHF (congestive heart failure) (HCC)    COPD (chronic obstructive pulmonary disease) (HCC)    Coronary artery disease    GERD (gastroesophageal reflux disease)    History of mitral valve replacement with mechanical valve    HLD (hyperlipidemia)    Hypertension    Pulmonary embolism (HCC) 2021   There were no vitals taken for this visit.  Opioid Risk Score:   Fall Risk Score:  `1  Depression screen [facility] 2/9     12/01/2022   11:27 AM 12/01/2022   10:54 AM 07/02/2022   11:30 AM 06/29/2022   10:31 AM 03/16/2022   10:57 AM 01/13/2022   11:32 AM 01/09/2022    9:02 AM  Depression screen PHQ 2/9  Decreased Interest 0 0 1 0 0 0 0  Down, Depressed, Hopeless 0 0 0 0 0 0 0  PHQ - 2 Score 0 0 1 0 0 0 0  Altered sleeping   0      Tired, decreased energy   0      Change in appetite   0      Feeling bad or failure about yourself    0      Trouble concentrating   0      Moving slowly or fidgety/restless   0      Suicidal thoughts   0      PHQ-9 Score   1      Difficult doing work/chores   Not difficult at all        Review of Systems     Objective:   Physical Exam        Assessment & Plan:

## 2023-08-23 NOTE — Anesthesia Preprocedure Evaluation (Signed)
 Anesthesia Evaluation  Patient identified by [name], date of birth, ID band Patient awake    Reviewed: Allergy & Precautions, H&P , NPO status , Patient's Chart, lab work & pertinent test results  Airway Mallampati: II   Neck ROM: full    Dental   Pulmonary asthma , COPD, Current Smoker and Patient abstained from smoking.   breath sounds clear to auscultation       Cardiovascular hypertension, + CAD and +CHF  + Valvular Problems/Murmurs  Rhythm:regular Rate:Normal  S/p mechanical mitral valve replacement.  Pt is on low dose levo and milrinone   Neuro/Psych  Headaches    GI/Hepatic ,GERD  ,,GI bleeding in setting of supratherapeutic INR. (>10 on admission)   Endo/Other    Renal/GU      Musculoskeletal  (+) Arthritis ,    Abdominal   Peds  Hematology  (+) Blood dyscrasia, anemia   Anesthesia Other Findings   Reproductive/Obstetrics                             Anesthesia Physical Anesthesia Plan  ASA: 4  Anesthesia Plan: MAC   Post-op Pain Management:    Induction: Intravenous  PONV Risk Score and Plan: 1 and Propofol infusion and Treatment may vary due to age or medical condition  Airway Management Planned: Nasal Cannula  Additional Equipment:   Intra-op Plan:   Post-operative Plan:   Informed Consent: I have reviewed the patients History and Physical, chart, labs and discussed the procedure including the risks, benefits and alternatives for the proposed anesthesia with the patient or authorized representative who has indicated his/her understanding and acceptance.     Dental advisory given  Plan Discussed with: CRNA, Anesthesiologist and Surgeon  Anesthesia Plan Comments:        Anesthesia Quick Evaluation

## 2023-08-23 NOTE — H&P (View-Only) (Signed)
 [facility] Progress Note  SUBJECTIVE:   Interval history: Kim Anthony was seen and evaluated today at bedside. Resting in bed with daughter, Kim Anthony, at bedside. Agreeable to EGD tomorrow as heparin not held 6 hours before procedure for today. No nausea or vomiting. Had chest pain this AM. No shortness of breath. Having lower abdominal pain with radiation to her back.   Past Medical History:  Diagnosis Date   Arthritis    Asthma    Breast cancer (HCC) 2001   Left Breast Cancer   CHF (congestive heart failure) (HCC)    COPD (chronic obstructive pulmonary disease) (HCC)    Coronary artery disease    GERD (gastroesophageal reflux disease)    History of mitral valve replacement with mechanical valve    HLD (hyperlipidemia)    Hypertension    Pulmonary embolism (HCC) 2021   Past Surgical History:  Procedure Laterality Date   APPLICATION OF A-CELL OF CHEST/ABDOMEN Left 01/27/2019   Procedure: APPLICATION OF A-CELL OF CHEST;  Surgeon: Peggye Form, DO;  Location: [facility] OR;  Service: Plastics;  Laterality: Left;   APPLICATION OF WOUND VAC N/A 01/27/2019   Procedure: APPLICATION OF WOUND VAC;  Surgeon: Peggye Form, DO;  Location: [facility];  Service: Plastics;  Laterality: N/A;   BREAST SURGERY Left 2001   for CA   CARDIOVERSION N/A 02/26/2022   Procedure: CARDIOVERSION;  Surgeon: Little Ishikawa, MD;  Location: [facility] ENDOSCOPY;  Service: Cardiovascular;  Laterality: N/A;   COLONOSCOPY WITH PROPOFOL N/A 06/07/2018   Procedure: COLONOSCOPY WITH PROPOFOL;  Surgeon: Charlott Rakes, MD;  Location: [facility] ENDOSCOPY;  Service: Endoscopy;  Laterality: N/A;   DEBRIDEMENT AND CLOSURE WOUND Left 12/28/2018   Procedure: Debridement And Closure Wound;  Surgeon: Abigail Miyamoto, MD;  Location: [facility] OR;  Service: General;  Laterality: Left;   INCISION AND DRAINAGE OF WOUND Left 01/27/2019   Procedure: IRRIGATION AND DEBRIDEMENT OF CHEST WALL;  Surgeon: Peggye Form,  DO;  Location: [facility] OR;  Service: Plastics;  Laterality: Left;   INTRAMEDULLARY (IM) NAIL INTERTROCHANTERIC N/A 09/23/2022   Procedure: INTRAMEDULLARY (IM) NAIL INTERTROCHANTERIC;  Surgeon: Tarry Kos, MD;  [facility];  Service: Orthopedics;  Laterality: N/A;   KNEE ARTHROSCOPY WITH MEDIAL MENISECTOMY Left 08/06/2021   Procedure: KNEE ARTHROSCOPY WITH PARTIAL MEDIAL MENISECTOMY, DEBRIDEMENT;  Surgeon: Jene Every, MD;  Location: [facility] ORS;  Service: Orthopedics;  Laterality: Left;  1 HR   LATISSIMUS FLAP TO BREAST Left 01/18/2019   Procedure: LEFT LATISSIMUS FLAP TO BREAST;  Surgeon: Peggye Form, DO;  [facility];  Service: Plastics;  Laterality: Left;   MASTECTOMY Left    MITRAL VALVE REPLACEMENT     mechanical   POLYPECTOMY  06/07/2018   Procedure: POLYPECTOMY;  Surgeon: Charlott Rakes, MD;  Location: [facility] ENDOSCOPY;  Service: Endoscopy;;   RIGHT HEART CATH N/A 08/20/2023   Procedure: RIGHT HEART CATH;  Surgeon: Dolores Patty, MD;  [facility] INVASIVE CV LAB;  Service: Cardiovascular;  Laterality: N/A;   RIGHT/LEFT HEART CATH AND CORONARY ANGIOGRAPHY N/A 01/09/2021   Procedure: RIGHT/LEFT HEART CATH AND CORONARY ANGIOGRAPHY;  Surgeon: Dolores Patty, MD;  [facility] INVASIVE CV LAB;  Service: Cardiovascular;  Laterality: N/A;   WOUND DEBRIDEMENT Left 12/28/2018   Procedure: EXCISION OF LEFT CHRONIC CHEST WALL WOUND;  Surgeon: Abigail Miyamoto, MD;  Location: [facility] OR;  Service: General;  Laterality: Left;   Current Facility-Administered Medications  Medication Dose Route Frequency Provider Last Rate Last Admin  0.9 %  sodium chloride infusion (Manually program via Guardrails IV Fluids)   Intravenous Continuous Bensimhon, Bevelyn Buckles, MD   Held at 08/20/23 1159   0.9 %  sodium chloride infusion (Manually program via Guardrails IV Fluids)   Intravenous PRN Bensimhon, Bevelyn Buckles, MD       acetaminophen (TYLENOL) tablet 650 mg  650 mg Oral Q4H PRN Bensimhon, Bevelyn Buckles,  MD   650 mg at 08/21/23 1933   amiodarone (NEXTERONE PREMIX) 360-4.14 MG/200ML-% (1.8 mg/mL) IV infusion  30 mg/hr Intravenous Continuous Bensimhon, Bevelyn Buckles, MD 16.67 mL/hr at 08/23/23 0800 30 mg/hr at 08/23/23 0800   Chlorhexidine Gluconate Cloth 2 % PADS 6 each  6 each Topical Daily Bensimhon, Bevelyn Buckles, MD   6 each at 08/22/23 0944   clonazePAM (KLONOPIN) tablet 0.5 mg  0.5 mg Oral BID PRN Cheri Fowler, MD   0.5 mg at 08/22/23 1932   docusate sodium (COLACE) capsule 100 mg  100 mg Oral BID PRN Bensimhon, Bevelyn Buckles, MD       fluticasone furoate-vilanterol (BREO ELLIPTA) 100-25 MCG/ACT 1 puff  1 puff Inhalation Daily Bensimhon, Bevelyn Buckles, MD   1 puff at 08/23/23 0758   And   umeclidinium bromide (INCRUSE ELLIPTA) 62.5 MCG/ACT 1 puff  1 puff Inhalation Daily Bensimhon, Bevelyn Buckles, MD   1 puff at 08/23/23 0758   heparin ADULT infusion 100 units/mL (25000 units/273mL)  1,600 Units/hr Intravenous Continuous [name], RPH 16 mL/hr at 08/23/23 0800 1,600 Units/hr at 08/23/23 0800   HYDROmorphone (DILAUDID) tablet 1 mg  1 mg Oral Q4H PRN Romie Minus, MD   1 mg at 08/22/23 1932   insulin aspart (novoLOG) injection 0-15 Units  0-15 Units Subcutaneous Q4H Bensimhon, Bevelyn Buckles, MD   2 Units at 08/21/23 2345   milrinone (PRIMACOR) 20 MG/100 ML (0.2 mg/mL) infusion  0.125 mcg/kg/min Intravenous Continuous Bensimhon, Bevelyn Buckles, MD 1.93 mL/hr at 08/23/23 0800 0.125 mcg/kg/min at 08/23/23 0800   norepinephrine (LEVOPHED) 4mg  in (0.016 mg/mL) premix infusion  2-20 mcg/min Intravenous Titrated Andrey Farmer, PA-C 7.5 mL/hr at 08/23/23 0800 2 mcg/min at 08/23/23 0800   ondansetron (ZOFRAN) injection 4 mg  4 mg Intravenous Q6H PRN Bensimhon, Bevelyn Buckles, MD   4 mg at 08/18/23 5409   Oral care mouth rinse  15 mL Mouth Rinse PRN Bensimhon, Bevelyn Buckles, MD       pantoprazole (PROTONIX) injection 40 mg  40 mg Intravenous Q12H Bensimhon, Bevelyn Buckles, MD   40 mg at 08/22/23 2111   polyethylene glycol (MIRALAX /  GLYCOLAX) packet 17 g  17 g Oral Daily PRN Bensimhon, Bevelyn Buckles, MD       polyethylene glycol (MIRALAX / GLYCOLAX) packet 17 g  17 g Oral Daily Cheri Fowler, MD   17 g at 08/22/23 1153   potassium chloride SA (KLOR-CON M) CR tablet 40 mEq  40 mEq Oral Once Andrey Farmer, PA-C       sennosides (SENOKOT) 8.8 MG/5ML syrup 5 mL  5 mL Per Tube BID Romie Minus, MD   5 mL at 08/22/23 2111   spironolactone (ALDACTONE) tablet 25 mg  25 mg Oral Daily Romie Minus, MD   25 mg at 08/22/23 0912   Allergies as of 08/17/2023 - Review Complete 08/17/2023  Allergen Reaction Noted   Lisinopril Swelling 01/07/2021   Acetaminophen-codeine Itching 07/10/2015   Propoxyphene Itching 01/18/2019   Tape Rash 03/09/2019   Tramadol Itching, Nausea And Vomiting, and Rash  07/10/2015   Review of Systems:  Review of Systems  Respiratory:  Negative for shortness of breath.   Cardiovascular:  Positive for chest pain.  Gastrointestinal:  Positive for abdominal pain. Negative for nausea and vomiting.    OBJECTIVE:   Temp:  [97.4 F (36.3 C)-98.2 F (36.8 C)] 98 F (36.7 C) (03/03 0730) Pulse Rate:  [72-100] 86 (03/03 0815) Resp:  [12-40] 18 (03/03 0815) BP: (81-134)/(42-114) 106/61 (03/03 0800) SpO2:  [85 %-100 %] 100 % (03/03 0815) Weight:  [73 kg] 73 kg (03/03 0400) Last BM Date : 08/20/23 Physical Exam Constitutional:      General: Kim Anthony is not in acute distress.    Appearance: Kim Anthony is not ill-appearing, toxic-appearing or diaphoretic.  Cardiovascular:     Rate and Rhythm: Normal rate and regular rhythm.     Heart sounds: Murmur heard.  Pulmonary:     Effort: No respiratory distress.     Breath sounds: Normal breath sounds.  Abdominal:     General: Bowel sounds are normal. There is no distension.     Palpations: Abdomen is soft.     Tenderness: There is abdominal tenderness. There is no guarding.  Skin:    General: Skin is warm and dry.  Neurological:     Mental Status: Kim Anthony is  alert.     Labs: Recent Labs    08/22/23 0453 08/22/23 1746 08/23/23 0454  WBC 11.7* 12.5* 15.8*  HGB 7.7* 8.3* 8.4*  HCT 24.4* 25.8* 25.9*  PLT 412* 428* 434*   BMET Recent Labs    08/22/23 1217 08/22/23 1936 08/23/23 0454  NA 130* 131* 131*  K 3.3* 3.4* 3.7  CL 94* 93* 92*  CO2 26 29 27   GLUCOSE 151* 114* 112*  BUN 21 18 14   CREATININE 0.93 0.97 0.85  CALCIUM 8.9 8.8* 9.1   LFT Recent Labs    08/23/23 0454  PROT 6.5  ALBUMIN 2.1*  AST 31  ALT 14  ALKPHOS 127*  BILITOT 2.0*   PT/INR No results for input(s): "LABPROT", "INR" in the last 72 hours. Diagnostic imaging: No results found.  IMPRESSION: Melena Anemia Supratherapeutic INR NSAID use Congestive heart failure Pulmonary hypertension, mild COPD Coronary artery disease Mitral valve replacement   PLAN: -Recommend EGD to evaluate melena, anemia, NSAID use -Discussed procedure benefits, alternatives, risks of bleeding/infection/perforation/missed lesion/anesthesia with patient and her daughter at bedside, agreeable to proceed with EGD -NPO at midnight for EGD 08/24/23 at 0900 in endoscopy suite -Plan to hold heparin 6 hours before EGD (roughly 0300) -Continue IV PPI Q12Hr -Start sucralfate suspension 1 gm PO QID for pain relief -Avoid all NSAIDs -Further recommendations to follow pending procedure   LOS: 5 days   Liliane Shi, DO [facility]

## 2023-08-24 ENCOUNTER — Encounter (HOSPITAL_COMMUNITY): Payer: Self-pay

## 2023-08-24 ENCOUNTER — Inpatient Hospital Stay (HOSPITAL_COMMUNITY): Admitting: Anesthesiology

## 2023-08-24 ENCOUNTER — Encounter (HOSPITAL_COMMUNITY)
Admission: EM | Disposition: A | Discharge status: Home Health | Payer: Self-pay | Source: Ambulatory Visit | Attending: Internal Medicine

## 2023-08-24 ENCOUNTER — Inpatient Hospital Stay (HOSPITAL_COMMUNITY)

## 2023-08-24 DIAGNOSIS — K2970 Gastritis, unspecified, without bleeding: Secondary | ICD-10-CM | POA: Diagnosis not present

## 2023-08-24 DIAGNOSIS — K3189 Other diseases of stomach and duodenum: Secondary | ICD-10-CM

## 2023-08-24 DIAGNOSIS — I110 Hypertensive heart disease with heart failure: Secondary | ICD-10-CM | POA: Diagnosis not present

## 2023-08-24 DIAGNOSIS — I509 Heart failure, unspecified: Secondary | ICD-10-CM | POA: Diagnosis not present

## 2023-08-24 DIAGNOSIS — R570 Cardiogenic shock: Secondary | ICD-10-CM | POA: Diagnosis not present

## 2023-08-24 DIAGNOSIS — I272 Pulmonary hypertension, unspecified: Secondary | ICD-10-CM | POA: Diagnosis not present

## 2023-08-24 DIAGNOSIS — I50814 Right heart failure due to left heart failure: Secondary | ICD-10-CM | POA: Diagnosis not present

## 2023-08-24 DIAGNOSIS — K922 Gastrointestinal hemorrhage, unspecified: Secondary | ICD-10-CM | POA: Diagnosis not present

## 2023-08-24 DIAGNOSIS — D62 Acute posthemorrhagic anemia: Secondary | ICD-10-CM | POA: Diagnosis not present

## 2023-08-24 HISTORY — PX: ESOPHAGOGASTRODUODENOSCOPY (EGD) WITH PROPOFOL: SHX5813

## 2023-08-24 LAB — COOXEMETRY PANEL
Carboxyhemoglobin: 4.2 % — ABNORMAL HIGH (ref 0.5–1.5)
Methemoglobin: <0.7 % (ref 0.0–1.5)
O2 Saturation: 61.9 %
Total hemoglobin: 8.7 g/dL — ABNORMAL LOW (ref 12.0–16.0)

## 2023-08-24 LAB — BASIC METABOLIC PANEL
Anion gap: 12 (ref 5–15)
BUN: 14 mg/dL (ref 8–23)
CO2: 24 mmol/L (ref 22–32)
Calcium: 8.2 mg/dL — ABNORMAL LOW (ref 8.9–10.3)
Chloride: 93 mmol/L — ABNORMAL LOW (ref 98–111)
Creatinine, Ser: 1.05 mg/dL — ABNORMAL HIGH (ref 0.44–1.00)
GFR, Estimated: 56 mL/min — ABNORMAL LOW (ref >60)
Glucose, Bld: 137 mg/dL — ABNORMAL HIGH (ref 70–99)
Potassium: 3.5 mmol/L (ref 3.5–5.1)
Sodium: 129 mmol/L — ABNORMAL LOW (ref 135–145)

## 2023-08-24 LAB — CBC
HCT: 26.5 % — ABNORMAL LOW (ref 36.0–46.0)
Hemoglobin: 8.7 g/dL — ABNORMAL LOW (ref 12.0–15.0)
MCH: 28.5 pg (ref 26.0–34.0)
MCHC: 32.8 g/dL (ref 30.0–36.0)
MCV: 86.9 fL (ref 80.0–100.0)
Platelets: 428 10*3/uL — ABNORMAL HIGH (ref 150–400)
RBC: 3.05 MIL/uL — ABNORMAL LOW (ref 3.87–5.11)
RDW: 18.4 % — ABNORMAL HIGH (ref 11.5–15.5)
WBC: 19.9 10*3/uL — ABNORMAL HIGH (ref 4.0–10.5)
nRBC: 0.2 % (ref 0.0–0.2)

## 2023-08-24 LAB — PROTIME-INR
INR: 1.3 — ABNORMAL HIGH (ref 0.8–1.2)
Prothrombin Time: 16.4 s — ABNORMAL HIGH (ref 11.4–15.2)

## 2023-08-24 LAB — PHOSPHORUS: Phosphorus: 3 mg/dL (ref 2.5–4.6)

## 2023-08-24 LAB — GLUCOSE, CAPILLARY
Glucose-Capillary: 102 mg/dL — ABNORMAL HIGH (ref 70–99)
Glucose-Capillary: 122 mg/dL — ABNORMAL HIGH (ref 70–99)
Glucose-Capillary: 92 mg/dL (ref 70–99)
Glucose-Capillary: 95 mg/dL (ref 70–99)
Glucose-Capillary: 96 mg/dL (ref 70–99)
Glucose-Capillary: 98 mg/dL (ref 70–99)

## 2023-08-24 LAB — HEPARIN LEVEL (UNFRACTIONATED): Heparin Unfractionated: 0.14 [IU]/mL — ABNORMAL LOW (ref 0.30–0.70)

## 2023-08-24 LAB — MAGNESIUM: Magnesium: 1.6 mg/dL — ABNORMAL LOW (ref 1.7–2.4)

## 2023-08-24 SURGERY — ESOPHAGOGASTRODUODENOSCOPY (EGD) WITH PROPOFOL
Anesthesia: Monitor Anesthesia Care

## 2023-08-24 MED ORDER — POTASSIUM CHLORIDE 20 MEQ PO PACK
40.0000 meq | PACK | Freq: Once | ORAL | Status: AC
  Administered 2023-08-24: 40 meq via ORAL
  Filled 2023-08-24: qty 2

## 2023-08-24 MED ORDER — FUROSEMIDE 10 MG/ML IJ SOLN
80.0000 mg | Freq: Two times a day (BID) | INTRAMUSCULAR | Status: AC
  Administered 2023-08-24 (×2): 80 mg via INTRAVENOUS
  Filled 2023-08-24 (×2): qty 8

## 2023-08-24 MED ORDER — LACTULOSE 10 GM/15ML PO SOLN
10.0000 g | Freq: Two times a day (BID) | ORAL | Status: DC
  Administered 2023-08-24: 10 g
  Filled 2023-08-24: qty 15

## 2023-08-24 MED ORDER — KETAMINE HCL 10 MG/ML IJ SOLN
INTRAMUSCULAR | Status: DC | PRN
  Administered 2023-08-24: 5 mg via INTRAVENOUS
  Administered 2023-08-24: 20 mg via INTRAVENOUS

## 2023-08-24 MED ORDER — SODIUM CHLORIDE 0.9 % IV SOLN: INTRAVENOUS | Status: DC

## 2023-08-24 MED ORDER — PANTOPRAZOLE SODIUM 40 MG PO TBEC
40.0000 mg | DELAYED_RELEASE_TABLET | Freq: Every day | ORAL | Status: DC
  Administered 2023-08-24 – 2023-09-07 (×15): 40 mg via ORAL
  Filled 2023-08-24 (×15): qty 1

## 2023-08-24 MED ORDER — METRONIDAZOLE 500 MG/100ML IV SOLN
500.0000 mg | Freq: Two times a day (BID) | INTRAVENOUS | Status: DC
  Administered 2023-08-24 – 2023-08-25 (×3): 500 mg via INTRAVENOUS
  Filled 2023-08-24 (×3): qty 100

## 2023-08-24 MED ORDER — LACTULOSE 10 GM/15ML PO SOLN
10.0000 g | Freq: Two times a day (BID) | ORAL | Status: AC
  Administered 2023-08-24: 10 g via ORAL
  Filled 2023-08-24: qty 15

## 2023-08-24 MED ORDER — PROPOFOL 500 MG/50ML IV EMUL
INTRAVENOUS | Status: DC | PRN
  Administered 2023-08-24: 25 ug/kg/min via INTRAVENOUS

## 2023-08-24 MED ORDER — DOCUSATE SODIUM 100 MG PO CAPS
100.0000 mg | ORAL_CAPSULE | Freq: Two times a day (BID) | ORAL | Status: DC
  Administered 2023-08-24 – 2023-08-31 (×12): 100 mg via ORAL
  Filled 2023-08-24 (×17): qty 1

## 2023-08-24 MED ORDER — SODIUM CHLORIDE 0.9 % IV SOLN
2.0000 g | INTRAVENOUS | Status: DC
  Administered 2023-08-24 – 2023-08-25 (×2): 2 g via INTRAVENOUS
  Filled 2023-08-24 (×2): qty 20

## 2023-08-24 MED ORDER — KETAMINE HCL 50 MG/5ML IJ SOSY
PREFILLED_SYRINGE | INTRAMUSCULAR | Status: AC
  Filled 2023-08-24: qty 5

## 2023-08-24 MED ORDER — OXYCODONE HCL 5 MG PO TABS: 5.0000 mg | ORAL_TABLET | Freq: Once | ORAL | Status: DC | PRN

## 2023-08-24 MED ORDER — LIDOCAINE HCL 4 % EX SOLN
CUTANEOUS | Status: DC | PRN
  Administered 2023-08-24: 2.5 mL via TOPICAL

## 2023-08-24 MED ORDER — WARFARIN SODIUM 2.5 MG PO TABS: 2.5000 mg | ORAL_TABLET | Freq: Once | ORAL | Status: DC

## 2023-08-24 MED ORDER — SODIUM CHLORIDE 0.9 % IV SOLN: INTRAVENOUS | Status: DC | PRN

## 2023-08-24 MED ORDER — FENTANYL CITRATE (PF) 100 MCG/2ML IJ SOLN: 25.0000 ug | INTRAMUSCULAR | Status: DC | PRN

## 2023-08-24 MED ORDER — WARFARIN SODIUM 5 MG PO TABS
5.0000 mg | ORAL_TABLET | Freq: Once | ORAL | Status: AC
  Administered 2023-08-24: 5 mg via ORAL
  Filled 2023-08-24: qty 1

## 2023-08-24 MED ORDER — MAGNESIUM SULFATE 4 GM/100ML IV SOLN
4.0000 g | Freq: Once | INTRAVENOUS | Status: AC
  Administered 2023-08-24: 4 g via INTRAVENOUS
  Filled 2023-08-24: qty 100

## 2023-08-24 MED ORDER — POTASSIUM CHLORIDE 20 MEQ PO PACK
40.0000 meq | PACK | Freq: Once | ORAL | Status: AC
  Administered 2023-08-24: 40 meq
  Filled 2023-08-24: qty 2

## 2023-08-24 MED ORDER — OXYCODONE HCL 5 MG/5ML PO SOLN: 5.0000 mg | Freq: Once | ORAL | Status: DC | PRN

## 2023-08-24 MED ORDER — POTASSIUM CHLORIDE 10 MEQ/50ML IV SOLN
10.0000 meq | INTRAVENOUS | Status: AC
Start: 2023-08-24 — End: 2023-08-24
  Administered 2023-08-24 (×4): 10 meq via INTRAVENOUS
  Filled 2023-08-24 (×4): qty 50

## 2023-08-24 MED ORDER — VASOPRESSIN 20 UNIT/ML IV SOLN
INTRAVENOUS | Status: DC | PRN
Start: 2023-08-24 — End: 2023-08-24
  Administered 2023-08-24 (×2): 1 [IU] via INTRAVENOUS

## 2023-08-24 MED ORDER — PROPOFOL 10 MG/ML IV BOLUS
INTRAVENOUS | Status: DC | PRN
  Administered 2023-08-24: 20 mg via INTRAVENOUS

## 2023-08-24 MED ORDER — HEPARIN (PORCINE) 25000 UT/250ML-% IV SOLN
2000.0000 [IU]/h | INTRAVENOUS | Status: DC
  Administered 2023-08-24: 1700 [IU]/h via INTRAVENOUS
  Administered 2023-08-25: 1900 [IU]/h via INTRAVENOUS
  Administered 2023-08-25: 1800 [IU]/h via INTRAVENOUS
  Administered 2023-08-26: 2400 [IU]/h via INTRAVENOUS
  Administered 2023-08-26: 2000 [IU]/h via INTRAVENOUS
  Administered 2023-08-27 – 2023-08-28 (×3): 2400 [IU]/h via INTRAVENOUS
  Administered 2023-08-28 – 2023-08-29 (×2): 2000 [IU]/h via INTRAVENOUS
  Filled 2023-08-24 (×11): qty 250

## 2023-08-24 MED ORDER — POLYETHYLENE GLYCOL 3350 17 G PO PACK
17.0000 g | PACK | Freq: Two times a day (BID) | ORAL | Status: DC
  Administered 2023-08-24 – 2023-08-31 (×9): 17 g via ORAL
  Filled 2023-08-24 (×15): qty 1

## 2023-08-24 MED ORDER — LIDOCAINE 2% (20 MG/ML) 5 ML SYRINGE
INTRAMUSCULAR | Status: DC | PRN
  Administered 2023-08-24: 20 mg via INTRAVENOUS

## 2023-08-24 MED ORDER — WARFARIN - PHARMACIST DOSING INPATIENT: Freq: Every day | Status: DC

## 2023-08-24 MED ORDER — GLYCOPYRROLATE PF 0.2 MG/ML IJ SOSY
PREFILLED_SYRINGE | INTRAMUSCULAR | Status: DC | PRN
  Administered 2023-08-24: .1 mg via INTRAVENOUS

## 2023-08-24 SURGICAL SUPPLY — 14 items

## 2023-08-24 NOTE — Progress Notes (Signed)
 [facility] ADULT ICU REPLACEMENT PROTOCOL   The patient does apply for the [facility] Adult ICU Electrolyte Replacment Protocol based on the criteria listed below:   1.Exclusion criteria: TCTS, ECMO, Dialysis, and Myasthenia Gravis patients 2. Is GFR >/= 30 ml/min? Yes.    Patient's GFR today is 56 3. Is SCr </= 2? Yes.   Patient's SCr is 1.05 mg/dL 4. Did SCr increase >/= 0.5 in 24 hours? No. 5.Pt's weight >40kg  Yes.   6. Abnormal electrolyte(s): Mag 1.6  7. Electrolytes replaced per protocol 8.  Call MD STAT for K+ </= 2.5, Phos </= 1, or Mag </= 1 Physician:  Dr. Vladimir Faster  Lolita Lenz 08/24/2023 4:51 AM

## 2023-08-24 NOTE — Transfer of Care (Signed)
 Immediate Anesthesia Transfer of Care Note  Patient: Kim Anthony  Procedure(s) Performed: ESOPHAGOGASTRODUODENOSCOPY (EGD) WITH PROPOFOL  Patient Location: PACU  Anesthesia Type:MAC  Level of Consciousness: awake, alert , and patient cooperative  Airway & Oxygen Therapy: Patient Spontanous Breathing and Patient connected to nasal cannula oxygen  Post-op Assessment: Report given to RN, Post -op Vital signs reviewed and stable, and Patient moving all extremities X 4  Post vital signs: Reviewed and stable  Last Vitals:  Vitals Value Taken Time  BP 111/39 08/24/23 1005  Temp 36.6 C 08/24/23 1005  Pulse 110 08/24/23 1005  Resp 35 08/24/23 1005  SpO2      Last Pain:  Vitals:   08/24/23 1005  TempSrc: Temporal  PainSc:       Patients Stated Pain Goal: 0 (08/24/23 0400)  Complications: No notable events documented.

## 2023-08-24 NOTE — Progress Notes (Signed)
 PHARMACY - ANTICOAGULATION CONSULT NOTE  Pharmacy Consult for heparin Indication:  Mechanical mitral valve replacement  Allergies  Allergen Reactions   Lisinopril Swelling   Acetaminophen-Codeine Itching   Propoxyphene Itching    Darvocet   Tape Rash    Plastic   Tramadol Itching, Nausea And Vomiting and Rash    Patient Measurements: Height: 5\' 5"  (165.1 cm) Weight: 73 kg (160 lb 15 oz) IBW/kg (Calculated) : 57 Heparin Dosing Weight: TBW  Vital Signs: Temp: 98.6 F (37 C) (03/04 1956) Temp Source: Axillary (03/04 1956) BP: 98/73 (03/04 1815) Pulse Rate: 96 (03/04 1900)  Labs: Recent Labs    08/22/23 1746 08/22/23 1936 08/23/23 0454 08/23/23 1416 08/23/23 1417 08/24/23 0249 08/24/23 2135  HGB 8.3*  --  8.4*  --   --  8.7*  --   HCT 25.8*  --  25.9*  --   --  26.5*  --   PLT 428*  --  434*  --   --  428*  --   LABPROT  --   --   --   --   --  16.4*  --   INR  --   --   --   --   --  1.3*  --   HEPARINUNFRC  --    < > 0.21* 0.15*  --   --  0.14*  CREATININE  --    < > 0.85  --  0.87 1.05*  --    < > = values in this interval not displayed.    Estimated Creatinine Clearance: 47.8 mL/min (A) (by C-G formula based on SCr of 1.05 mg/dL (H)).   Medical History: Past Medical History:  Diagnosis Date   Arthritis    Asthma    Breast cancer (HCC) 2001   Left Breast Cancer   CHF (congestive heart failure) (HCC)    COPD (chronic obstructive pulmonary disease) (HCC)    Coronary artery disease    GERD (gastroesophageal reflux disease)    History of mitral valve replacement with mechanical valve    HLD (hyperlipidemia)    Hypertension    Pulmonary embolism (HCC) 2021     Assessment: 47 yoM with hx AFib and mMVR on warfarin (goal INR 2.5-3.5). Warfarin held on admit with melenic stools, heparin started for anticoagulation.  Pt s/p EGD today, no bleeding identified, will resume IV heparin. Will continue lower goal range for today. Ok to resume warfarin per  CCM.  Home dose = 5mg  daily except 7.5mg  Mon/Thurs (5mg  daily per pt report)  Heparin level 0.14 (subtherapeutic) after resuming s/p EGD. Hb 8.7, plt 428.   Goal of Therapy:  Heparin level 0.3-0.5 - try to keep on lower end of goal Monitor platelets by anticoagulation protocol: Yes   Plan:  Increase heparin no bolus to 1800 units/h Check heparin level in 8h Warfarin 5mg  x1 tonight Daily INR   Cedric Fishman, PharmD, BCPS, BCCCP Clinical Pharmacist

## 2023-08-24 NOTE — Progress Notes (Addendum)
 PHARMACY - ANTICOAGULATION CONSULT NOTE  Pharmacy Consult for heparin Indication:  Mechanical mitral valve replacement  Allergies  Allergen Reactions   Lisinopril Swelling   Acetaminophen-Codeine Itching   Propoxyphene Itching    Darvocet   Tape Rash    Plastic   Tramadol Itching, Nausea And Vomiting and Rash    Patient Measurements: Height: 5\' 5"  (165.1 cm) Weight: 73 kg (160 lb 15 oz) IBW/kg (Calculated) : 57 Heparin Dosing Weight: TBW  Vital Signs: Temp: 97.8 F (36.6 C) (03/04 1005) Temp Source: Temporal (03/04 1005) BP: 101/67 (03/04 1020) Pulse Rate: 109 (03/04 1020)  Labs: Recent Labs    08/22/23 1746 08/22/23 1936 08/23/23 0454 08/23/23 1416 08/23/23 1417 08/24/23 0249  HGB 8.3*  --  8.4*  --   --  8.7*  HCT 25.8*  --  25.9*  --   --  26.5*  PLT 428*  --  434*  --   --  428*  LABPROT  --   --   --   --   --  16.4*  INR  --   --   --   --   --  1.3*  HEPARINUNFRC  --  0.19* 0.21* 0.15*  --   --   CREATININE  --  0.97 0.85  --  0.87 1.05*    Estimated Creatinine Clearance: 47.8 mL/min (A) (by C-G formula based on SCr of 1.05 mg/dL (H)).   Medical History: Past Medical History:  Diagnosis Date   Arthritis    Asthma    Breast cancer (HCC) 2001   Left Breast Cancer   CHF (congestive heart failure) (HCC)    COPD (chronic obstructive pulmonary disease) (HCC)    Coronary artery disease    GERD (gastroesophageal reflux disease)    History of mitral valve replacement with mechanical valve    HLD (hyperlipidemia)    Hypertension    Pulmonary embolism (HCC) 2021     Assessment: 77 yoM with hx AFib and mMVR on warfarin (goal INR 2.5-3.5). Warfarin held on admit with melenic stools, heparin started for anticoagulation.  Pt s/p EGD today, no bleeding identified, will resume IV heparin. Will continue lower goal range for today. Ok to resume warfarin per CCM.  Home dose = 5mg  daily except 7.5mg  Mon/Thurs (5mg  daily per pt report)  Goal of Therapy:   Heparin level 0.3-0.5 - try to keep on lower end of goal Monitor platelets by anticoagulation protocol: Yes   Plan:  Resume heparin no bolus at 1700 units/h Check heparin level in 8h Warfarin 5mg  x1 tonight Daily INR   Fredonia Highland, PharmD, BCPS, [facility] Clinical Pharmacist [phone] Please check AMION for all [facility] numbers 08/24/2023

## 2023-08-24 NOTE — Op Note (Signed)
 [facility] Patient Name: Kim Anthony Procedure Date : 08/24/2023 [name]: [id number] Attending MD: Liliane Shi DO, DO, [id number] Date of Birth: 1950/11/27 CSN: [id number] Age: 73 Admit Type: Inpatient Procedure:                Upper GI endoscopy Indications:              Acute post hemorrhagic anemia, Melena, INR > 10 on                            presentation, corrected at time of EGD procedure. Providers:                Liliane Shi DO, DO, Fransisca Connors, Kandice Robinsons, Technician Referring MD:              Medicines:                See the Anesthesia note for documentation of the                            administered medications Complications:            No immediate complications. Estimated Blood Loss:     Estimated blood loss: none. Procedure:                Pre-Anesthesia Assessment:                           - ASA Grade Assessment: IV - A patient with severe                            systemic disease that is a constant threat to life.                           - The risks and benefits of the procedure and the                            sedation options and risks were discussed with the                            patient. All questions were answered and informed                            consent was obtained.                           After obtaining informed consent, the endoscope was                            passed under direct vision. Throughout the                            procedure, the patient's blood pressure, pulse, and  oxygen saturations were monitored continuously. The                            [id number]-[id number] ([id number]) Olympus endoscope was introduced                            through the mouth, and advanced to the second part                            of duodenum. The upper GI endoscopy was                            accomplished without difficulty. The patient                             tolerated the procedure well. Scope In: Scope Out: Findings:      The examined esophagus was normal.      The Z-line was regular and was found 43 cm from the incisors.      Diffuse granular mucosa was found in the entire examined stomach.      Localized mild inflammation characterized by congestion (edema) was       found in the gastric antrum.      There is no endoscopic evidence of bleeding or ulceration in the entire       examined stomach.      The examined duodenum was normal. Impression:               - Normal esophagus.                           - Z-line regular, 43 cm from the incisors.                           - Granular gastric mucosa.                           - Gastritis.                           - Normal examined duodenum.                           - No specimens collected. Recommendation:           - Return patient to hospital ward for ongoing care.                           - Cardiac diet.                           - Change from IV PPI therapy to once daily oral PPI.                           - Discontinue sucralfate.                           - Optimize bowel regimen for LLQ abdominal pain as  per review of flow sheets, patient has not had BM                            in > 5 days.                           - Monitor bowel movements.                           - Above discussed with patient's daughter, Kim Anthony,                            over telephone.                           [facility] will sign off and be available as needed                            should questions arise. Procedure Code(s):        --- Professional ---                           [id number], Esophagogastroduodenoscopy, flexible,                            transoral; diagnostic, including collection of                            specimen(s) by brushing or washing, when performed                            (separate procedure) Diagnosis Code(s):        --- Professional ---                            K31.89, Other diseases of stomach and duodenum                           K29.70, Gastritis, unspecified, without bleeding                           D62, Acute posthemorrhagic anemia                           K92.1, Melena (includes Hematochezia) CPT copyright 2022 American Medical Association. All rights reserved. The codes documented in this report are preliminary and upon coder review may  be revised to meet current compliance requirements. Dr Liliane Shi, DO Liliane Shi DO, DO 08/24/2023 10:07:07 AM Number of Addenda: 0

## 2023-08-24 NOTE — Progress Notes (Signed)
 Advanced Heart Failure Rounding Note  Cardiologist: Arvilla Meres, MD   Chief Complaint: RV Failure  Subjective:    Intermittent delirium. Received seroquel and zyprexa again last night.   WBCs continue to trend up, up to 20K this am. Remains AF.  LLQ tender with palpation.  Now requiring supplemental O2. Denies dyspnea but appears tachypneic.  CO-OX 62% on 0.125 milrinone + 2 NE. CVP 14.     Objective:   Weight Range: 73 kg Body mass index is 26.78 kg/m.   Vital Signs:   Temp:  [97.5 F (36.4 C)-98.6 F (37 C)] 98.1 F (36.7 C) (03/04 0300) Pulse Rate:  [84-127] 107 (03/04 0700) Resp:  [14-39] 30 (03/04 0700) BP: (69-145)/(42-110) 97/57 (03/04 0700) SpO2:  [78 %-100 %] 91 % (03/04 0700) Last BM Date : 08/20/23  Weight change: Filed Weights   08/20/23 0500 08/21/23 0600 08/23/23 0400  Weight: 51.4 kg 77.9 kg 73 kg   Intake/Output:   Intake/Output Summary (Last 24 hours) at 08/24/2023 0736 Last data filed at 08/24/2023 0400 Gross per 24 hour  Intake 1128.43 ml  Output 700 ml  Net 428.43 ml     Physical Exam    General:  Ill appearing Neck: JVP to jaw Cor: Regular rate & rhythm, tachy.  Lungs: clear Abdomen: LLQ tender with palpation  Extremities: no edema Neuro: Alert and oriented X 3. Affect anxious.   Telemetry   ST 100s, 5 PVCs/min  EKG    N/A  Labs    CBC Recent Labs    08/21/23 1216 08/21/23 1541 08/23/23 0454 08/24/23 0249  WBC 10.5   < > 15.8* 19.9*  NEUTROABS 6.5  --  10.2*  --   HGB 8.1*   < > 8.4* 8.7*  HCT 25.6*   < > 25.9* 26.5*  MCV 89.8   < > 88.4 86.9  PLT 454*   < > 434* 428*   < > = values in this interval not displayed.   Basic Metabolic Panel Recent Labs    69/62/95 1936 08/23/23 0454 08/23/23 1417 08/24/23 0249  NA 131*   < > 131* 129*  K 3.4*   < > 3.6 3.5  CL 93*   < > 92* 93*  CO2 29   < > 26 24  GLUCOSE 114*   < > 112* 137*  BUN 18   < > 13 14  CREATININE 0.97   < > 0.87 1.05*  CALCIUM 8.8*    < > 8.7* 8.2*  MG 1.7  --   --  1.6*  PHOS  --   --   --  3.0   < > = values in this interval not displayed.   Liver Function Tests Recent Labs    08/23/23 0454 08/23/23 1417  AST 31 28  ALT 14 14  ALKPHOS 127* 127*  BILITOT 2.0* 2.1*  PROT 6.5 6.3*  ALBUMIN 2.1* 2.1*   No results for input(s): "LIPASE", "AMYLASE" in the last 72 hours.  Cardiac Enzymes No results for input(s): "CKTOTAL", "CKMB", "CKMBINDEX", "TROPONINI" in the last 72 hours.  BNP: BNP (last 3 results) Recent Labs    01/19/23 1601 07/29/23 1347 08/17/23 2035  BNP 264.0* 374.8* 547.9*   Other results:    Medications:    Scheduled Medications:  Chlorhexidine Gluconate Cloth  6 each Topical Daily   fluticasone furoate-vilanterol  1 puff Inhalation Daily   And   umeclidinium bromide  1 puff Inhalation Daily   insulin  aspart  0-15 Units Subcutaneous Q4H   pantoprazole (PROTONIX) IV  40 mg Intravenous Q12H   polyethylene glycol  17 g Oral Daily   potassium chloride  40 mEq Oral Once   QUEtiapine  25 mg Oral BID   sennosides  5 mL Oral BID   spironolactone  25 mg Oral Daily   sucralfate  1 g Oral Q8H    Infusions:  sodium chloride Stopped (08/20/23 1159)   amiodarone 30 mg/hr (08/24/23 0300)   milrinone 0.125 mcg/kg/min (08/24/23 0300)   norepinephrine (LEVOPHED) Adult infusion 2 mcg/min (08/24/23 0300)   potassium chloride 10 mEq (08/24/23 0722)    PRN Medications: sodium chloride, acetaminophen, docusate sodium, HYDROmorphone, ondansetron (ZOFRAN) IV, mouth rinse, polyethylene glycol  Patient Profile   Kim Anthony is a 73 y.o. female with a history of mechanical MVR in 2001 in [place], [place], CAD, hx breast cancer s/p left mastectomy and radiation/chemo, tobacco use, COPD, prior PE/DVT, HTN, undergoing workup for possible inflammatory arthritis with Rheumatology, and systolic CHF.   Assessment/Plan   GIB - EGD clearance Hypotension Blood loss anemia - hypotension with melena requiring  RBCs - Hgb 8.7 today; stable.  - EGD planned today - Continue NE and milrinone at least through procedure today - bleeding management per CCM   2. Acute on chronic RV Failure HFimpEF Pulmonary HTN  - Suspect likely underlying OSA contributing to PH/RV failure. Also w/ known COPD. - Repeat echo with EF 55% and D-shaped septum, RV-RA gradient 25 mmHg and severe TR (does not coapt) - NYHA III-IV on admit - CO-OX 62% on milrinone 0.125 + NE 2.  - CVP up to 14 today. Restart IV lasix 80 BID. - Hold spiro with soft BP   3. AKI/hypokalemia - in the setting of RV congestion - resolved.    4. PAF - She does not do well in AFL/AF. - s/p TEE and DCCV 9/23 to NSR. - On/Off PO amio for prolonged QTc - Recent admit 2/25 with AFL, converted to NSR on amio, but then stopped d/t long QT - NSR today - Holding heparin for today's EGD. Restart when felt to be safe post EGD - continue amio gtt while on inotropes   5. CAD  Elevated Troponin - 1 V CAD with occluded mid RCA on cath (7/22). - hsTrop [id number]. Likely demand ischemia, decongest and follow - EKG without ST changes - No s/s angina. - on heparin - continue statin    6. Hx of mechanical mitral valve replacement: - Mechanical MVR in [place] in 2001. - Stable on echo 2/25 MV gradient 3 - On heparin with bleed - Continue SBE prophylaxis.   7. Tricuspid valve regurgitation - severe on echo 2/25 - Follow    8. Gout - has been taking NSAIDs and Prednisone at home.   9. Hyponatremia - Hypervolemic hyponatremia - Na 129 today, trending low 130s last few days - Continue diuresis  10. Lower abdominal pain - Etiology not certain - KUB and UA okay - GI added sucralfate - With rising WBCs will treat with empiric abx. May need imaging. See below  11. ID - WBCs up to 20K today. No fevers.  - LLQ abdominal pain as above - Starting empiric ceftriaxone and metronidazole. Discussed with CCM.  - Check CXR with O2 requirement but  may be d/t pulmonary edema   Length of Stay: 6  Anna Genre, PA-C 7:36 AM  CRITICAL CARE Performed by: Maximino Sarin   Total critical  care time: 20 minutes  Critical care time was exclusive of separately billable procedures and treating other patients.  Critical care was necessary to treat or prevent imminent or life-threatening deterioration.  Critical care was time spent personally by me on the following activities: development of treatment plan with patient and/or surrogate as well as nursing, discussions with consultants, evaluation of patient's response to treatment, examination of patient, obtaining history from patient or surrogate, ordering and performing treatments and interventions, ordering and review of laboratory studies, ordering and review of radiographic studies, pulse oximetry and re-evaluation of patient's condition.    Advanced Heart Failure Team Pager [phone] (M-F; 7a - 5p)  Please contact [facility] for night-coverage after hours (5p -7a ) and weekends on [email or web address]

## 2023-08-24 NOTE — Interval H&P Note (Signed)
 History and Physical Interval Note:  08/24/2023 9:11 AM  Kim Anthony  has presented today for surgery, with the diagnosis of Melena.  The various methods of treatment have been discussed with the patient and family. After consideration of risks, benefits and other options for treatment, the patient has consented to  Procedure(s): ESOPHAGOGASTRODUODENOSCOPY (EGD) WITH PROPOFOL (N/A) as a surgical intervention.  The patient's history has been reviewed, patient examined, no change in status, stable for surgery.  I have reviewed the patient's chart and labs.  Questions were answered to the patient's satisfaction.     Lynann Bologna

## 2023-08-24 NOTE — Progress Notes (Signed)
 PT Cancellation Note  Patient Details Name: AVERI KILTY [name]: [id number] DOB: 09-20-1950   Cancelled Treatment:    Reason Eval/Treat Not Completed: Patient at procedure or test/unavailable. Pt off the floor at EGD. Acute PT to return as able to complete PT eval.  Lewis Shock, PT, DPT Acute Rehabilitation Services Secure chat preferred Office #: [phone]    Iona Hansen 08/24/2023, 8:29 AM

## 2023-08-24 NOTE — Progress Notes (Signed)
 NAME:  Kim Anthony, [name]:  [id number], DOB:  [birth year], LOS: 6 ADMISSION DATE:  08/17/2023, CONSULTATION DATE:  08/18/23 REFERRING MD:  Dr. Lily Kocher, CHIEF COMPLAINT:  GI bleed   History of Present Illness:  73 year old female patient with multiple comorbidities as below, most prominently with mechanical mitral valve on chronic Coumadin therapy, A-fib present with lower GI bleed, bloody BMs, dizziness and weakness.   Of note patient has had previous episodes of melena, had colonoscopy in 2019 showing 20 mm polyp in internal hemorrhoids. Patient admits to recent NSAID use for past few weeks BID, has chronic pain, multiple complaints, critical care asked to see this patient due to symptomatic anemia and concern for worsening GI bleed.   Case is discussed with the hospitalist over the phone, is concerned about her IV access she currently has 2 peripheral IVs, we can continue to use this for blood products and IV fluids.  RN notified bedside by myself that if patient's IV access worsens and we will place a central line bedside.  Otherwise, patient can be admitted to the ICU for closer observation.  Pertinent  Medical History   has a past medical history of Arthritis, Asthma, Breast cancer (HCC) (2001), CHF (congestive heart failure) (HCC), COPD (chronic obstructive pulmonary disease) (HCC), Coronary artery disease, GERD (gastroesophageal reflux disease), History of mitral valve replacement with mechanical valve, HLD (hyperlipidemia), Hypertension, and Pulmonary embolism (HCC) (2021).   Significant Hospital Events: Including procedures, antibiotic start and stop dates in addition to other pertinent events   2/25 admitted to IMTS for hypotension and melena likely acute GI bleed, transfer to ICU due to limited IV access and close monitoring s/p 1u PRBC, GI consulted 2/26 PICC placed  2/27 cardiology consulted, coox 59% and CVP elevated 3/3 EGD planned tomorrow on 3/4  3/4 Anxious and delirious overnight  received zyprexa, plan for EGD, complaining of lower quadrant abdominal    Interim History / Subjective:  Alert, Oriented x3 to 4, able to follow commands, still anxious  Currently on levophed, milrinone, heparin gtt paused overnight for EGD scheduled at 0900   Objective   Blood pressure 119/81, pulse (!) 105, temperature 97.9 F (36.6 C), temperature source Temporal, resp. rate (!) 23, height 5\' 5"  (1.651 m), weight 73 kg, SpO2 91%. CVP:  [8 mmHg-21 mmHg] 8 mmHg      Intake/Output Summary (Last 24 hours) at 08/24/2023 0850 Last data filed at 08/24/2023 0400 Gross per 24 hour  Intake 1044.18 ml  Output 700 ml  Net 344.18 ml   Filed Weights   08/21/23 0600 08/23/23 0400 08/24/23 0827  Weight: 77.9 kg 73 kg 73 kg    Examination:  General: acute on chronically ill older adult female, lying in ICU bed, anxious  HEENT: normocephalic, pink moist MM, poor dentition, missing teeth CV: s1,s2, RRR, no JVD, no MRG  Pulm: clear, diminished, no distress Abdomen: BS active, soft, lower quadrant abdominal pain reported, but also noted on palpation  No signs of bruising or swelling on abdominal exam Extremities: non pitting edema generalized, no deformity, moves extremities to commands, right knee slightly larger than left- hx of gout but no increase in pain, redness at this time  Neuro: AOx3 to 4, delirious overnight, anxious, PERRLA intact GU: amber to tea colored urine, purewick   Assessment & Plan:  Acute upper GI bleeding Acute blood loss anemia on chronic anemia  Patient presented with melena No further episodes of bleeding noted Received 1 unit PRBCs during this admission  Hgb stable, complaining more consistently of lower quadrant abdominal pain  Hgb 8.7 on 3/4  P: EGD scheduled for today 3/4, f/u with GI and results  Notified MD  Continue protonix 40mg  BID [place] in regards to patient's complaints of LLQ abdominal pain and increasing WBC.  Heparin gtt on hold, restart when  appropriate per GI  Continue to trend h/h, transfuse hgb < 7   Leukocytosis  WBC increasing UA negative on 3/3, complaining of abdominal pain lower quadrant, No fever trend at this time. Abdominal X-ray on 3/3 negative for acute processes, normal gas pattern  Increase WBC Having tachypnea, on 4LNC O2 sats 93%-suspect this due to venous congestion vs infectious process  P:  Discussed with GI MD Lorenso Quarry, will obtain EGD before further abdominal imaging at this time.  Will add empiric coverage-ceftriaxone and flagyl  Continue to trend WBC daily per CBC Continue to trend fever curve  Obtain Chest X-ray, continue diuresis   Acute on chronic RV failure with cardiogenic shock HFimpEF (EF 55%) Pulmonary hypertension 4.8 L urine output, negative 3/7 L within 3/2-3/3 Coox 62 % 3/4 Increase in CVP to 14, on 4 Liters of O2 P: Advance HF team following, appreciate assitance  Continue milrinone at 0.125, and levophed at Continue amio gtt, restart heparin gtt when appropriate-on hold for EGD  Per ADHF team will give lasix, hold spirolactone  Continue monitor CVP and Coox    AKI on CKD 3a due to cardiorenal syndrome AGMA, improved Hypokalemia/hypomagnesemia Hypervolemic hyponatremia Serum creatinine 1.05 from .87, increase venous congestion  Still hyponatremic, hypokalemic and hypomagnesemic in setting of diuresis  P: Continue to trend renal function daily  Continue to monitor and optimize electrolytes daily Continue to monitor urine output Continue strict I/Os Continue Adequate renal perfusion  Avoid nephrotoxic agents  Magnesium and Potassium replaced per elink   Demand cardiac ischemia Prolonged QT EKG showed no ST or T wave changes No chest pain P: Continue to hold antiplatelets in setting of GI bleed Continue to monitor qTC on cardiac monitor   Paroxysmal A-fib status post cardioversion 9/23 Mechanical mitral valve replacement on Coumadin Remain in sinus  rhythm Continue telemetry monitoring P: Continue amio gtt, resume heparin gtt when medically appropriate Continue to hold off coumadin for now   Shock liver In the setting of cardiogenic shock P: Repeat CMET tomorrow 3/5 to evaluate LFTs Continue to avoid hepatotoxic agents     COPD, not in exacerbation P: Continue inhalers: continue breo ellipta, and incruse  Chronic back pain Postherpetic neuralgia Chronic opiate use Anxiety disorder Gout Patient was taking BID NSAID for past few weeks  Received zyprexa again overnight 3/3  Patient's daughter verbalized that patient has become delirious on previous hospitalizations P: Continue to hold off NSAIDs in setting of GI bleed Continue current pain regimen PRN Seroquel added yesterday, 25mg  BID Continue delirium precautions  Continue PT/OT eval Continue up to chair daily   Prediabetes A1c is 6 Blood sugars are better controlled P: Continue SSI Continue CBG goal 140 to 180   Continue CBG q4 NPO at this time due to EGD    Best Practice (right click and "Reselect all SmartList Selections" daily)   Diet/type: Regular consistency DVT prophylaxis systemic heparin Pressure ulcer(s): N/A GI prophylaxis: PPI Lines: PICC, yes still needed Foley:  N/A Code Status:  full code Last date of multidisciplinary goals of care discussion [ updated daughter at bedside 3/2 ]  Critical care time:    Hazel Sams AGACNP-BC  [facility] 08/24/2023, 8:50 AM  Please see [email or web address] for pager details.  From 7A-7P if no response, please call [phone]. After hours, please call ELink [phone].

## 2023-08-24 NOTE — Anesthesia Postprocedure Evaluation (Signed)
 Anesthesia Post Note  Patient: Kim Anthony  Procedure(s) Performed: ESOPHAGOGASTRODUODENOSCOPY (EGD) WITH PROPOFOL     Patient location during evaluation: Endoscopy Anesthesia Type: MAC Level of consciousness: awake and alert Pain management: pain level controlled Vital Signs Assessment: post-procedure vital signs reviewed and stable Respiratory status: spontaneous breathing, nonlabored ventilation, respiratory function stable and patient connected to nasal cannula oxygen Cardiovascular status: stable and blood pressure returned to baseline Postop Assessment: no apparent nausea or vomiting Anesthetic complications: no   No notable events documented.  Last Vitals:  Vitals:   08/24/23 1020 08/24/23 1100  BP: 101/67   Pulse: (!) 109   Resp: 14   Temp:  36.4 C  SpO2: 93%     Last Pain:  Vitals:   08/24/23 1158  TempSrc:   PainSc: 8                  Tome Wilson S

## 2023-08-25 ENCOUNTER — Inpatient Hospital Stay (HOSPITAL_COMMUNITY)

## 2023-08-25 DIAGNOSIS — A419 Sepsis, unspecified organism: Secondary | ICD-10-CM

## 2023-08-25 DIAGNOSIS — K922 Gastrointestinal hemorrhage, unspecified: Secondary | ICD-10-CM | POA: Diagnosis not present

## 2023-08-25 DIAGNOSIS — D72829 Elevated white blood cell count, unspecified: Secondary | ICD-10-CM | POA: Diagnosis not present

## 2023-08-25 DIAGNOSIS — R570 Cardiogenic shock: Secondary | ICD-10-CM | POA: Diagnosis not present

## 2023-08-25 LAB — CBC
HCT: 24.9 % — ABNORMAL LOW (ref 36.0–46.0)
Hemoglobin: 8.1 g/dL — ABNORMAL LOW (ref 12.0–15.0)
MCH: 28.5 pg (ref 26.0–34.0)
MCHC: 32.5 g/dL (ref 30.0–36.0)
MCV: 87.7 fL (ref 80.0–100.0)
Platelets: 441 10*3/uL — ABNORMAL HIGH (ref 150–400)
RBC: 2.84 MIL/uL — ABNORMAL LOW (ref 3.87–5.11)
RDW: 18.3 % — ABNORMAL HIGH (ref 11.5–15.5)
WBC: 24.3 10*3/uL — ABNORMAL HIGH (ref 4.0–10.5)
nRBC: 0 % (ref 0.0–0.2)

## 2023-08-25 LAB — COMPREHENSIVE METABOLIC PANEL
ALT: 11 U/L (ref 0–44)
AST: 23 U/L (ref 15–41)
Albumin: 2 g/dL — ABNORMAL LOW (ref 3.5–5.0)
Alkaline Phosphatase: 108 U/L (ref 38–126)
Anion gap: 15 (ref 5–15)
BUN: 20 mg/dL (ref 8–23)
CO2: 22 mmol/L (ref 22–32)
Calcium: 8.4 mg/dL — ABNORMAL LOW (ref 8.9–10.3)
Chloride: 95 mmol/L — ABNORMAL LOW (ref 98–111)
Creatinine, Ser: 1.3 mg/dL — ABNORMAL HIGH (ref 0.44–1.00)
GFR, Estimated: 43 mL/min — ABNORMAL LOW (ref >60)
Glucose, Bld: 94 mg/dL (ref 70–99)
Potassium: 3.9 mmol/L (ref 3.5–5.1)
Sodium: 132 mmol/L — ABNORMAL LOW (ref 135–145)
Total Bilirubin: 2.7 mg/dL — ABNORMAL HIGH (ref 0.0–1.2)
Total Protein: 6.4 g/dL — ABNORMAL LOW (ref 6.5–8.1)

## 2023-08-25 LAB — COOXEMETRY PANEL
Carboxyhemoglobin: 3.1 % — ABNORMAL HIGH (ref 0.5–1.5)
Carboxyhemoglobin: 2.8 % — ABNORMAL HIGH (ref 0.5–1.5)
Methemoglobin: <0.7 % (ref 0.0–1.5)
Methemoglobin: <0.7 % (ref 0.0–1.5)
O2 Saturation: 80.4 %
O2 Saturation: 63.1 %
Total hemoglobin: 8.4 g/dL — ABNORMAL LOW (ref 12.0–16.0)
Total hemoglobin: 8.7 g/dL — ABNORMAL LOW (ref 12.0–16.0)

## 2023-08-25 LAB — GLUCOSE, CAPILLARY
Glucose-Capillary: 103 mg/dL — ABNORMAL HIGH (ref 70–99)
Glucose-Capillary: 104 mg/dL — ABNORMAL HIGH (ref 70–99)
Glucose-Capillary: 110 mg/dL — ABNORMAL HIGH (ref 70–99)
Glucose-Capillary: 124 mg/dL — ABNORMAL HIGH (ref 70–99)
Glucose-Capillary: 133 mg/dL — ABNORMAL HIGH (ref 70–99)
Glucose-Capillary: 144 mg/dL — ABNORMAL HIGH (ref 70–99)

## 2023-08-25 LAB — MRSA NEXT GEN BY PCR, NASAL: MRSA by PCR Next Gen: NOT DETECTED

## 2023-08-25 LAB — BRAIN NATRIURETIC PEPTIDE: B Natriuretic Peptide: 506.9 pg/mL — ABNORMAL HIGH (ref 0.0–100.0)

## 2023-08-25 LAB — HEPARIN LEVEL (UNFRACTIONATED)
Heparin Unfractionated: 0.1 [IU]/mL — ABNORMAL LOW (ref 0.30–0.70)
Heparin Unfractionated: 0.24 [IU]/mL — ABNORMAL LOW (ref 0.30–0.70)

## 2023-08-25 LAB — MAGNESIUM: Magnesium: 2.2 mg/dL (ref 1.7–2.4)

## 2023-08-25 LAB — PHOSPHORUS: Phosphorus: 4.3 mg/dL (ref 2.5–4.6)

## 2023-08-25 LAB — PROTIME-INR
INR: 1.4 — ABNORMAL HIGH (ref 0.8–1.2)
Prothrombin Time: 17.4 s — ABNORMAL HIGH (ref 11.4–15.2)

## 2023-08-25 LAB — PROCALCITONIN: Procalcitonin: 5.14 ng/mL

## 2023-08-25 MED ORDER — ENSURE ENLIVE PO LIQD
237.0000 mL | Freq: Two times a day (BID) | ORAL | Status: DC
  Administered 2023-08-25 – 2023-09-07 (×16): 237 mL via ORAL

## 2023-08-25 MED ORDER — POTASSIUM CHLORIDE 20 MEQ PO PACK
40.0000 meq | PACK | Freq: Once | ORAL | Status: AC
  Administered 2023-08-25: 40 meq via ORAL
  Filled 2023-08-25: qty 2

## 2023-08-25 MED ORDER — PIPERACILLIN-TAZOBACTAM 3.375 G IVPB
3.3750 g | Freq: Three times a day (TID) | INTRAVENOUS | Status: AC
  Administered 2023-08-25 – 2023-08-31 (×20): 3.375 g via INTRAVENOUS
  Filled 2023-08-25 (×23): qty 50

## 2023-08-25 MED ORDER — FUROSEMIDE 10 MG/ML IJ SOLN
80.0000 mg | Freq: Once | INTRAMUSCULAR | Status: AC
  Administered 2023-08-25: 80 mg via INTRAVENOUS
  Filled 2023-08-25: qty 8

## 2023-08-25 MED ORDER — FUROSEMIDE 10 MG/ML IJ SOLN
80.0000 mg | Freq: Once | INTRAMUSCULAR | Status: AC
  Administered 2023-08-25: 80 mg via INTRAVENOUS

## 2023-08-25 MED ORDER — WARFARIN SODIUM 5 MG PO TABS
5.0000 mg | ORAL_TABLET | Freq: Once | ORAL | Status: AC
  Administered 2023-08-25: 5 mg via ORAL

## 2023-08-25 NOTE — Progress Notes (Signed)
 Pharmacy Antibiotic Note  Kim Anthony is a 73 y.o. female admitted on 08/17/2023 with CHF and GIB. WBC continues to rise and pressor requirements up slightly. Pharmacy has been consulted for Zosyn dosing.  Plan: Stop ceftriaxone/metronidazole Zosyn 3.375g IV EI q8h Follow Cx, LOT  Height: 5\' 5"  (165.1 cm) Weight: 73 kg (160 lb 15 oz) IBW/kg (Calculated) : 57  Temp (24hrs), Avg:98.1 F (36.7 C), Min:97.5 F (36.4 C), Max:98.7 F (37.1 C)  Recent Labs  Lab 08/22/23 0453 08/22/23 1217 08/22/23 1746 08/22/23 1936 08/23/23 0454 08/23/23 1417 08/24/23 0249 08/25/23 0542  WBC 11.7*  --  12.5*  --  15.8*  --  19.9* 24.3*  CREATININE 1.02*   < >  --  0.97 0.85 0.87 1.05* 1.30*   < > = values in this interval not displayed.    Estimated Creatinine Clearance: 38.6 mL/min (A) (by C-G formula based on SCr of 1.3 mg/dL (H)).    Allergies  Allergen Reactions   Lisinopril Swelling   Acetaminophen-Codeine Itching   Propoxyphene Itching    Darvocet   Tape Rash    Plastic   Tramadol Itching, Nausea And Vomiting and Rash    Fredonia Highland, PharmD, BCPS, [facility] Clinical Pharmacist [phone] Please check AMION for all [facility] Pharmacy numbers 08/25/2023

## 2023-08-25 NOTE — Evaluation (Signed)
 Physical Therapy Evaluation Patient Details Name: Kim Anthony [name]: [id number] DOB: [birth year] Today's Date: 08/25/2023  History of Present Illness  Pt is 73 yo presenting to ED 2/25 with melena and symptomatic anemia in setting of GI bleed and chronic coumadin use. Underwent R heart cath on 2/28 in setting of acute on chronic RV failure and pulmonary hypertension. EGD 3/4. PMH: gout, CAD, HFrEF, a fib, HTN, GERD, HLD, COPD, hx of breast cancer s/p L mastectomy and radiation/chemo.   Clinical Impression  Pt admitted with above. PTA pt living with daughter, indep without AD, home alone during the day, mod I with ADLs, and drove. Pt currently presenting with generalized weakness and deconditioning requiring maxA for OOB mobility. Pt with delayed processing but able to follow commands and participate in therapy. Recommending inpatient rehab program < 3 hrs/day to allow for increased time to achieve safe mod I level of function prior to return home with dtr. Acute PT to cont to follow.      If plan is discharge home, recommend the following: Assistance with cooking/housework;Assist for transportation;Help with stairs or ramp for entrance;A lot of help with walking and/or transfers;A lot of help with bathing/dressing/bathroom   Can travel by private vehicle   No    Equipment Recommendations None recommended by PT  Recommendations for Other Services       Functional Status Assessment Patient has had a recent decline in their functional status and demonstrates the ability to make significant improvements in function in a reasonable and predictable amount of time.     Precautions / Restrictions Precautions Precautions: Fall Recall of Precautions/Restrictions: Impaired Restrictions Weight Bearing Restrictions Per Provider Order: No      Mobility  Bed Mobility Overal bed mobility: Needs Assistance Bed Mobility: Supine to Sit     Supine to sit: Used rails, HOB elevated, Max assist      General bed mobility comments: Pt required assist to initiate LE to EOB and lift trunk. use of bed pad to scoot hips to EOB    Transfers Overall transfer level: Needs assistance Equipment used: 2 person hand held assist (2 person face to face transfer with use of bed pad) Transfers: Sit to/from Stand, Bed to chair/wheelchair/BSC Sit to Stand: Mod assist, +2 physical assistance, +2 safety/equipment     Squat pivot transfers: Max assist, +2 physical assistance     General transfer comment: max directional verbal cues, pt with good effort with LEs to power up however significant difficulty trying to step to chair resulting in more of a squat pvt with use of bed pad with maxA to chair form EOB    Ambulation/Gait               General Gait Details: unable this date  Stairs            Wheelchair Mobility     Tilt Bed    Modified Rankin (Stroke Patients Only)       Balance Overall balance assessment: Needs assistance Sitting-balance support: No upper extremity supported, Feet supported Sitting balance-Leahy Scale: Fair Sitting balance - Comments: trunk and cervical flexion, difficulty holding head up however overall maintained balance with close supervision   Standing balance support: Bilateral upper extremity supported, During functional activity Standing balance-Leahy Scale: Poor Standing balance comment: dependent on external support                             Pertinent Vitals/Pain Pain  Assessment Pain Assessment: Faces Faces Pain Scale: Hurts even more Pain Location: R wrist/hand stating "ooooo I have gout in that hand" Pain Descriptors / Indicators: Grimacing, Guarding Pain Intervention(s): Limited activity within patient's tolerance (kept mitten on as pt reports some relief)    Home Living Family/patient expects to be discharged to:: Private residence Living Arrangements: Children Available Help at Discharge: Family;Available  PRN/intermittently Type of Home: House Home Access: Stairs to enter Entrance Stairs-Rails: Right;Left;Can reach both Entrance Stairs-Number of Steps: 8   Home Layout: One level Home Equipment: Rollator (4 wheels);Rolling Walker (2 wheels);Cane - single point;BSC/3in1;Shower seat;Grab bars - tub/shower Additional Comments: daughter works    Prior Function Prior Level of Function : Independent/Modified Independent;Driving             Mobility Comments: Mod I with use of cane vs walker ADLs Comments: Mod I for ADLs and IADLs, sponge bathes typically, only showers if someone is home     Extremity/Trunk Assessment   Upper Extremity Assessment Upper Extremity Assessment: Generalized weakness    Lower Extremity Assessment Lower Extremity Assessment: Generalized weakness    Cervical / Trunk Assessment Cervical / Trunk Assessment: Normal  Communication   Communication Communication: No apparent difficulties (soft spoken)    Cognition Arousal: Alert Behavior During Therapy: WFL for tasks assessed/performed   PT - Cognitive impairments: Sequencing                       PT - Cognition Comments: pt A&Ox4, stated hospital in [place], no recall of [place] after told to pt, pt able to follow commands with increased time. Following commands: Impaired Following commands impaired: Follows one step commands with increased time, Follows multi-step commands inconsistently     Cueing Cueing Techniques: Verbal cues, Gestural cues     General Comments General comments (skin integrity, edema, etc.): VSS, RN present during eval to assist with transfers and manage lines    Exercises     Assessment/Plan    PT Assessment Patient needs continued PT services  PT Problem List Decreased balance;Cardiopulmonary status limiting activity;Decreased activity tolerance;Decreased mobility;Decreased safety awareness       PT Treatment Interventions DME instruction;Therapeutic  activities;Gait training;Balance training;Stair training;Functional mobility training;Therapeutic exercise;Patient/family education    PT Goals (Current goals can be found in the Care Plan section)  Acute Rehab PT Goals PT Goal Formulation: With patient Time For Goal Achievement: 09/08/23 Potential to Achieve Goals: Good    Frequency Min 1X/week     Co-evaluation               AM-PAC PT "6 Clicks" Mobility  Outcome Measure Help needed turning from your back to your side while in a flat bed without using bedrails?: A Lot Help needed moving from lying on your back to sitting on the side of a flat bed without using bedrails?: A Lot Help needed moving to and from a bed to a chair (including a wheelchair)?: A Lot Help needed standing up from a chair using your arms (e.g., wheelchair or bedside chair)?: A Lot Help needed to walk in hospital room?: Total Help needed climbing 3-5 steps with a railing? : Total 6 Click Score: 10    End of Session Equipment Utilized During Treatment: Gait belt Activity Tolerance: Patient tolerated treatment well Patient left: with call bell/phone within reach;in chair;with chair alarm set;with nursing/sitter in room Nurse Communication: Mobility status PT Visit Diagnosis: Other abnormalities of gait and mobility (R26.89);Muscle weakness (generalized) (M62.81)  Time: 0820-0852 PT Time Calculation (min) (ACUTE ONLY): 32 min   Charges:   PT Evaluation $PT Eval Moderate Complexity: 1 Mod PT Treatments $Therapeutic Activity: 8-22 mins PT General Charges $$ ACUTE PT VISIT: 1 Visit         Lewis Shock, PT, DPT Acute Rehabilitation Services Secure chat preferred Office #: [phone]   Kim Anthony 08/25/2023, 10:30 AM

## 2023-08-25 NOTE — Progress Notes (Signed)
 NAME:  Kim Anthony, [name]:  [id number], DOB:  05/01/1951, LOS: 7 ADMISSION DATE:  08/17/2023, CONSULTATION DATE:  08/18/23 REFERRING MD:  Dr. Lily Kocher, CHIEF COMPLAINT:  GI bleed   History of Present Illness:  73 year old female patient with multiple comorbidities as below, most prominently with mechanical mitral valve on chronic Coumadin therapy, A-fib present with lower GI bleed, bloody BMs, dizziness and weakness.   Of note patient has had previous episodes of melena, had colonoscopy in 2019 showing 20 mm polyp in internal hemorrhoids. Patient admits to recent NSAID use for past few weeks BID, has chronic pain, multiple complaints, critical care asked to see this patient due to symptomatic anemia and concern for worsening GI bleed.   Case is discussed with the hospitalist over the phone, is concerned about her IV access she currently has 2 peripheral IVs, we can continue to use this for blood products and IV fluids.  RN notified bedside by myself that if patient's IV access worsens and we will place a central line bedside.  Otherwise, patient can be admitted to the ICU for closer observation.  Pertinent  Medical History   has a past medical history of Arthritis, Asthma, Breast cancer (HCC) (2001), CHF (congestive heart failure) (HCC), COPD (chronic obstructive pulmonary disease) (HCC), Coronary artery disease, GERD (gastroesophageal reflux disease), History of mitral valve replacement with mechanical valve, HLD (hyperlipidemia), Hypertension, and Pulmonary embolism (HCC) (2021).   Significant Hospital Events: Including procedures, antibiotic start and stop dates in addition to other pertinent events   2/25 admitted to IMTS for hypotension and melena likely acute GI bleed, transfer to ICU due to limited IV access and close monitoring s/p 1u PRBC, GI consulted 2/26 PICC placed  2/27 cardiology consulted, coox 59% and CVP elevated 3/3 EGD planned tomorrow on 3/4  3/4 On levophed, milrinone, amio, and  heparin.  EGD completed: Diffuse granular mucosa found at Entire stomach localized mild inflammation in the gastric antrum no ulceration in the stomach the duodenum was normal impression was gastritis.  Started on Flagyl and ceftriaxone, ? aspiration 3/5: Delirium persists.  Changed ceftriaxone and Flagyl to Zosyn   Interim History / Subjective:  Denies acute distress, however does have some increased work of breathing, got Lasix earlier today   Objective   Blood pressure (!) 86/56, pulse (!) 101, temperature 98.2 F (36.8 C), temperature source Axillary, resp. rate (!) 35, height 5\' 5"  (1.651 m), weight 73 kg, SpO2 94%. CVP:  [6 mmHg-47 mmHg] 12 mmHg      Intake/Output Summary (Last 24 hours) at 08/25/2023 0819 Last data filed at 08/25/2023 0700 Gross per 24 hour  Intake 1508.3 ml  Output 1110 ml  Net 398.3 ml   Filed Weights   08/21/23 0600 08/23/23 0400 08/24/23 0827  Weight: 77.9 kg 73 kg 73 kg    Examination:  General This is a 73 year old female patient she is currently sitting up in a chair she is in no acute distress however does exhibit some mild exertional dyspnea HEENT normocephalic atraumatic mucous membranes are moist she has symmetrical smile Pulmonary currently on 4 L nasal cannula, she has diffuse bilateral rales best heard posteriorly no accessory use at rest.  Portable chest x-ray personally reviewed Cardiomegaly, diffuse bilateral patchy airspace disease Cardiac: Irregular irregular with atrial fibrillation on telemetry she has a audible click consistent with her known valve Abdomen soft not tender Extremities are warm and dry with brisk capillary refill and really minimal edema Neuro awake oriented x 3 but  affect a little off slightly encephalopathic does require intermittent reminding and reorientation GU clear yellow urine  Resolved Hospital Problem list   Lactic acidosis Anion gap metabolic acidosis Shock liver  Assessment & Plan:  Acute upper GI bleeding  secondary to gastritis per EGD on 3/2 with associated Acute blood loss anemia on chronic anemia  Received 1 unit PRBCs during this admission, CBC has been fairly stable since admission Plan Daily PPI per GI Carafate stopped  stopped A.m. CBC  Acute on chronic RV failure with cardiogenic shock HFimpEF (EF 55%) Pulmonary hypertension Advance HF team following, appreciate assistance, Co. oximetry remains in the 60s.  Vasoactive drip requirements worse, leukocytosis rising Plan Continue to monitor CVP Continuing milrinone, defer management to heart failure Norepinephrine for mean arterial pressure greater than 65 Telemetry monitoring She got a dose of Lasix today Will need to follow her clinically  Leukocytosis, concern for evolving sepsis.  Her white blood cell count has continued to climb during her hospitalization Plan Send procalcitonin Blood cultures Repeating chest x-ray today I am going to change her ceftriaxone to Zosyn for possible aspiration, also will repeat MRSA PCR, if positive start vanc  AKI on CKD 3a due to cardiorenal syndrome Serum creatinine continuing a little worse today.  She has had increased BP requirements Plan Strict intake output Renal dose medications Ensure optimized cardiac output Mean arterial pressure goal greater than 65 Serial chemistries  Demand cardiac ischemia Prolonged QT EKG showed no ST or T wave changes No chest pain Plan Continue to hold antiplatelets  Continue to monitor QTC on cardiac monitor   Paroxysmal A-fib status post cardioversion 9/23 Mechanical mitral valve replacement on Coumadin Remain in sinus rhythm Continue telemetry monitoring Plan Continue amio gtt Continue heparin gtt starting warfarin tonight    COPD, not in exacerbation plan Continue inhalers: continue breo ellipta, and incruse    Delirium Plan Supportive care. She is currently off her Neurontin, I do not think withdrawal is contributing Continuing  twice daily Seroquel  Chronic back pain Postherpetic neuralgia Chronic opiate use Anxiety disorder Gout Patient was taking BID NSAID for past few weeks  Received zyprexa and klonopin overnight 3/2  plan Hold off NSAIDs in setting of GI bleed, I do not think we should resume these given gastritis Continue current pain regiment PRN  Added seroquel 25mg  at bedtime (continue)  delirium precautions  PT/OT to evaluate  Up to chair daily   Prediabetes A1c is 6 Blood sugars are better controlled Plan Continue SSI, CBG goal 140 to 180    Best Practice (right click and "Reselect all SmartList Selections" daily)   Diet/type: Regular consistency DVT prophylaxis systemic heparin adding coumadin Pressure ulcer(s): N/A GI prophylaxis: PPI Lines: PICC, yes still needed Foley:  N/A Code Status:  full code Last date of multidisciplinary goals of care discussion [ updated daughter at bedside 3/2 ]  Critical care time: 32 min

## 2023-08-25 NOTE — Progress Notes (Addendum)
 PHARMACY - ANTICOAGULATION CONSULT NOTE  Pharmacy Consult for heparin Indication:  Mechanical mitral valve replacement  Allergies  Allergen Reactions   Lisinopril Swelling   Acetaminophen-Codeine Itching   Propoxyphene Itching    Darvocet   Tape Rash    Plastic   Tramadol Itching, Nausea And Vomiting and Rash    Patient Measurements: Height: 5\' 5"  (165.1 cm) Weight: 73 kg (160 lb 15 oz) IBW/kg (Calculated) : 57 Heparin Dosing Weight: TBW  Vital Signs: Temp: 98.3 F (36.8 C) (03/05 0322) Temp Source: Axillary (03/05 0322) BP: 94/52 (03/05 0646) Pulse Rate: 98 (03/05 0700)  Labs: Recent Labs    08/23/23 0454 08/23/23 1416 08/23/23 1417 08/24/23 0249 08/24/23 2135 08/25/23 0542  HGB 8.4*  --   --  8.7*  --  8.1*  HCT 25.9*  --   --  26.5*  --  24.9*  PLT 434*  --   --  428*  --  441*  LABPROT  --   --   --  16.4*  --  17.4*  INR  --   --   --  1.3*  --  1.4*  HEPARINUNFRC 0.21* 0.15*  --   --  0.14* 0.10*  CREATININE 0.85  --  0.87 1.05*  --  1.30*    Estimated Creatinine Clearance: 38.6 mL/min (A) (by C-G formula based on SCr of 1.3 mg/dL (H)).   Medical History: Past Medical History:  Diagnosis Date   Arthritis    Asthma    Breast cancer (HCC) 2001   Left Breast Cancer   CHF (congestive heart failure) (HCC)    COPD (chronic obstructive pulmonary disease) (HCC)    Coronary artery disease    GERD (gastroesophageal reflux disease)    History of mitral valve replacement with mechanical valve    HLD (hyperlipidemia)    Hypertension    Pulmonary embolism (HCC) 2021     Assessment: 48 yoM with hx AFib and mMVR on warfarin (goal INR 2.5-3.5). Warfarin held on admit with melenic stools, heparin started for anticoagulation. EGD 3/4 with no bleeding, heparin and warfarin resumed postop.  INR today is 1.4, heparin level remains low at 0.1, CBC stable. No bleeding concerns overnight.  Home dose = 5mg  daily except 7.5mg  Mon/Thurs (5mg  daily per pt  report)  Goal of Therapy:  Heparin level 0.3-0.5 - try to keep on lower end of goal Monitor platelets by anticoagulation protocol: Yes  Plan:  Increase heparin to 1900 Check heparin level in 8h - will try to get peripheral stick Warfarin 5mg  x1 tonight Daily INR   Fredonia Highland, PharmD, BCPS, [facility] Clinical Pharmacist [phone] Please check AMION for all [facility] Pharmacy numbers 08/25/2023

## 2023-08-25 NOTE — Progress Notes (Signed)
 PHARMACY - ANTICOAGULATION CONSULT NOTE  Pharmacy Consult for heparin > warfarin Indication:  Mechanical mitral valve replacement  Allergies  Allergen Reactions   Lisinopril Swelling   Acetaminophen-Codeine Itching   Propoxyphene Itching    Darvocet   Tape Rash    Plastic   Tramadol Itching, Nausea And Vomiting and Rash    Patient Measurements: Height: 5\' 5"  (165.1 cm) Weight: 73 kg (160 lb 15 oz) IBW/kg (Calculated) : 57 Heparin Dosing Weight: TBW  Vital Signs: Temp: 97.8 F (36.6 C) (03/05 2020) Temp Source: Oral (03/05 2020) BP: 83/56 (03/05 1845) Pulse Rate: 83 (03/05 1845)  Labs: Recent Labs    08/23/23 0454 08/23/23 1416 08/23/23 1417 08/24/23 0249 08/24/23 2135 08/25/23 0542 08/25/23 2027  HGB 8.4*  --   --  8.7*  --  8.1*  --   HCT 25.9*  --   --  26.5*  --  24.9*  --   PLT 434*  --   --  428*  --  441*  --   LABPROT  --   --   --  16.4*  --  17.4*  --   INR  --   --   --  1.3*  --  1.4*  --   HEPARINUNFRC 0.21*   < >  --   --  0.14* 0.10* 0.24*  CREATININE 0.85  --  0.87 1.05*  --  1.30*  --    < > = values in this interval not displayed.    Estimated Creatinine Clearance: 38.6 mL/min (A) (by C-G formula based on SCr of 1.3 mg/dL (H)).   Medical History: Past Medical History:  Diagnosis Date   Arthritis    Asthma    Breast cancer (HCC) 2001   Left Breast Cancer   CHF (congestive heart failure) (HCC)    COPD (chronic obstructive pulmonary disease) (HCC)    Coronary artery disease    GERD (gastroesophageal reflux disease)    History of mitral valve replacement with mechanical valve    HLD (hyperlipidemia)    Hypertension    Pulmonary embolism (HCC) 2021     Assessment: 11 yoM with hx AFib and mMVR on warfarin (goal INR 2.5-3.5). Warfarin held on admit with melenic stools, heparin started for anticoagulation. EGD 3/4 with no bleeding, heparin and warfarin resumed postop.  INR today is 1.4, Heparin drip 1900 uts/hr with  heparin level   0.24 slightly < goal. CBC stable. Drawn by phlebotomy when drawing blood cultures. No bleeding concerns overnight.  Home dose = 5mg  daily except 7.5mg  Mon/Thurs (5mg  daily per pt report)  Goal of Therapy:  Heparin level 0.3-0.5 - try to keep on lower end of goal Monitor platelets by anticoagulation protocol: Yes  Plan:  Increase heparin to 2000 uts/hr   will try to get peripheral stick Daily INR, heparin level and CBC     Leota Sauers Pharm.D. CPP, BCPS Clinical Pharmacist [phone] 08/25/2023 9:12 PM

## 2023-08-25 NOTE — Progress Notes (Addendum)
 Advanced Heart Failure Rounding Note  Cardiologist: Arvilla Meres, MD   Chief Complaint: RV Failure  Subjective:    CO-OX up to 63% on 0.125 milrinone +3 NE.  WBCs up to 24K. No fever. Currently on ceftriaxone + metronidazole.  More alert but still dealing with delirium. Sitting up in chair. Less abdominal pain.   Objective:   Weight Range: 73 kg Body mass index is 26.78 kg/m.   Vital Signs:   Temp:  [97.5 F (36.4 C)-98.7 F (37.1 C)] 98.3 F (36.8 C) (03/05 0322) Pulse Rate:  [94-114] 98 (03/05 0700) Resp:  [14-45] 43 (03/05 0700) BP: (70-126)/(39-94) 94/52 (03/05 0646) SpO2:  [91 %-98 %] 94 % (03/05 0700) Weight:  [73 kg] 73 kg (03/04 0827) Last BM Date : 08/20/23  Weight change: Filed Weights   08/21/23 0600 08/23/23 0400 08/24/23 0827  Weight: 77.9 kg 73 kg 73 kg   Intake/Output:   Intake/Output Summary (Last 24 hours) at 08/25/2023 0720 Last data filed at 08/25/2023 0700 Gross per 24 hour  Intake 1508.3 ml  Output 1460 ml  Net 48.3 ml     Physical Exam    General:  Chronically ill appearing elderly female Neck: JVP to midneck Cor: Regular rate & rhythm. No rubs, gallops, + systolic murmur Lungs: clear Abdomen: soft, nontender, nondistended.  Extremities: no edema Neuro: alert & orientedx3. Affect pleasant    Telemetry   ST 100s, PVCs  EKG    N/A  Labs    CBC Recent Labs    08/23/23 0454 08/24/23 0249 08/25/23 0542  WBC 15.8* 19.9* 24.3*  NEUTROABS 10.2*  --   --   HGB 8.4* 8.7* 8.1*  HCT 25.9* 26.5* 24.9*  MCV 88.4 86.9 87.7  PLT 434* 428* 441*   Basic Metabolic Panel Recent Labs    95/28/41 0249 08/25/23 0542  NA 129* 132*  K 3.5 3.9  CL 93* 95*  CO2 24 22  GLUCOSE 137* 94  BUN 14 20  CREATININE 1.05* 1.30*  CALCIUM 8.2* 8.4*  MG 1.6* 2.2  PHOS 3.0 4.3   Liver Function Tests Recent Labs    08/23/23 1417 08/25/23 0542  AST 28 23  ALT 14 11  ALKPHOS 127* 108  BILITOT 2.1* 2.7*  PROT 6.3* 6.4*   ALBUMIN 2.1* 2.0*   No results for input(s): "LIPASE", "AMYLASE" in the last 72 hours.  Cardiac Enzymes No results for input(s): "CKTOTAL", "CKMB", "CKMBINDEX", "TROPONINI" in the last 72 hours.  BNP: BNP (last 3 results) Recent Labs    01/19/23 1601 07/29/23 1347 08/17/23 2035  BNP 264.0* 374.8* 547.9*   Other results:    Medications:    Scheduled Medications:  Chlorhexidine Gluconate Cloth  6 each Topical Daily   docusate sodium  100 mg Oral BID   feeding supplement  237 mL Oral BID BM   fluticasone furoate-vilanterol  1 puff Inhalation Daily   And   umeclidinium bromide  1 puff Inhalation Daily   insulin aspart  0-15 Units Subcutaneous Q4H   pantoprazole  40 mg Oral Daily   polyethylene glycol  17 g Oral BID   QUEtiapine  25 mg Oral BID   sennosides  5 mL Oral BID   Warfarin - Pharmacist Dosing Inpatient   Does not apply q1600    Infusions:  sodium chloride Stopped (08/20/23 1159)   amiodarone 30 mg/hr (08/25/23 0700)   cefTRIAXone (ROCEPHIN)  IV Stopped (08/24/23 1135)   heparin 1,800 Units/hr (08/25/23 0700)  metronidazole Stopped (08/24/23 2305)   milrinone 0.125 mcg/kg/min (08/25/23 0700)   norepinephrine (LEVOPHED) Adult infusion 3 mcg/min (08/25/23 0700)    PRN Medications: sodium chloride, acetaminophen, HYDROmorphone, ondansetron (ZOFRAN) IV, mouth rinse  Patient Profile   Kim Anthony is a 73 y.o. female with a history of mechanical MVR in 2001 in [place], [place], CAD, hx breast cancer s/p left mastectomy and radiation/chemo, tobacco use, COPD, prior PE/DVT, HTN, undergoing workup for possible inflammatory arthritis with Rheumatology, and systolic CHF.   Assessment/Plan   1. Acute on chronic RV Failure HFimpEF Pulmonary HTN  - Suspect likely underlying OSA contributing to PH/RV failure. Also w/ known COPD. - Echo this admit EF 55% and D-shaped septum, RV-RA gradient 25 mmHg and severe TR (does not coapt) - NYHA III-IV on admit - CO-OX 63%.  Remains on 0.125 milrinone + 3 NE. Difficulty titrating NE off d/t hypotension. ? Developing sepsis. See below. - CVP 10 today. Give 80 mg lasix IV. - Holding GDMT with hypotension   2. AKI/hypokalemia - in the setting of RV congestion - resolved.    3. PAF - She does not do well in AFL/AF. - s/p TEE and DCCV 9/23 to NSR. - On/Off PO amio for prolonged QTc - Recent admit 2/25 with AFL, converted to NSR on amio, but then stopped d/t long QT - NSR today - Warfarin has been restarted, INR 1.4. Continue heparin gtt - IV amiodarone while on inotropes/pressors   4. CAD  Elevated Troponin - 1 V CAD with occluded mid RCA on cath (7/22). - hsTrop [id number]. Likely demand ischemia, decongest and follow - EKG without ST changes - No s/s angina. - on heparin - continue statin  5. GI bleed Anemia  - hypotension with melena requiring RBCs - Hgb stable 8.1 today - EGD 03/04 with gastritis, no other significant findings    6. Hx of mechanical mitral valve replacement: - Mechanical MVR in [place] in 2001. - Stable on echo 2/25 MV gradient 3 - Continue warfarin and heparin. Pharm D dosing warfarin. - Continue SBE prophylaxis.   7. Tricuspid valve regurgitation - severe on echo 2/25 - Follow    8. Gout - has been taking NSAIDs and Prednisone at home.  - Avoid recurrent NSAID use  9. Hyponatremia - Hypervolemic hyponatremia - Improving - Continue diuresis  10. ID - WBCs up to 24K today. No fevers. Concern for developing sepsis. - Ceftriaxone and Flagyl switched to Zosyn per CCM - BC X 2 and procalcitonin pending   Length of Stay: 7  Anna Genre, PA-C 7:20 AM  CRITICAL CARE Performed by: Maximino Sarin   Total critical care time: 20 minutes  Critical care time was exclusive of separately billable procedures and treating other patients.  Critical care was necessary to treat or prevent imminent or life-threatening deterioration.  Critical care was time spent  personally by me on the following activities: development of treatment plan with patient and/or surrogate as well as nursing, discussions with consultants, evaluation of patient's response to treatment, examination of patient, obtaining history from patient or surrogate, ordering and performing treatments and interventions, ordering and review of laboratory studies, ordering and review of radiographic studies, pulse oximetry and re-evaluation of patient's condition.    Advanced Heart Failure Team Pager [phone] (M-F; 7a - 5p)  Please contact [facility] for night-coverage after hours (5p -7a ) and weekends on [email or web address]

## 2023-08-26 ENCOUNTER — Encounter (HOSPITAL_COMMUNITY): Payer: Self-pay | Admitting: Internal Medicine

## 2023-08-26 DIAGNOSIS — R570 Cardiogenic shock: Secondary | ICD-10-CM | POA: Diagnosis not present

## 2023-08-26 DIAGNOSIS — R6521 Severe sepsis with septic shock: Secondary | ICD-10-CM

## 2023-08-26 DIAGNOSIS — M109 Gout, unspecified: Secondary | ICD-10-CM

## 2023-08-26 DIAGNOSIS — A419 Sepsis, unspecified organism: Secondary | ICD-10-CM | POA: Diagnosis not present

## 2023-08-26 DIAGNOSIS — K922 Gastrointestinal hemorrhage, unspecified: Secondary | ICD-10-CM | POA: Diagnosis not present

## 2023-08-26 LAB — CBC
HCT: 24.2 % — ABNORMAL LOW (ref 36.0–46.0)
Hemoglobin: 7.9 g/dL — ABNORMAL LOW (ref 12.0–15.0)
MCH: 27.8 pg (ref 26.0–34.0)
MCHC: 32.6 g/dL (ref 30.0–36.0)
MCV: 85.2 fL (ref 80.0–100.0)
Platelets: 466 10*3/uL — ABNORMAL HIGH (ref 150–400)
RBC: 2.84 MIL/uL — ABNORMAL LOW (ref 3.87–5.11)
RDW: 18.4 % — ABNORMAL HIGH (ref 11.5–15.5)
WBC: 20.2 10*3/uL — ABNORMAL HIGH (ref 4.0–10.5)
nRBC: 0 % (ref 0.0–0.2)

## 2023-08-26 LAB — BASIC METABOLIC PANEL
Anion gap: 13 (ref 5–15)
Anion gap: 17 — ABNORMAL HIGH (ref 5–15)
BUN: 22 mg/dL (ref 8–23)
BUN: 18 mg/dL (ref 8–23)
CO2: 23 mmol/L (ref 22–32)
CO2: 21 mmol/L — ABNORMAL LOW (ref 22–32)
Calcium: 8.1 mg/dL — ABNORMAL LOW (ref 8.9–10.3)
Calcium: 8 mg/dL — ABNORMAL LOW (ref 8.9–10.3)
Chloride: 92 mmol/L — ABNORMAL LOW (ref 98–111)
Chloride: 92 mmol/L — ABNORMAL LOW (ref 98–111)
Creatinine, Ser: 1.32 mg/dL — ABNORMAL HIGH (ref 0.44–1.00)
Creatinine, Ser: 1.4 mg/dL — ABNORMAL HIGH (ref 0.44–1.00)
GFR, Estimated: 43 mL/min — ABNORMAL LOW (ref >60)
GFR, Estimated: 40 mL/min — ABNORMAL LOW (ref >60)
Glucose, Bld: 78 mg/dL (ref 70–99)
Glucose, Bld: 168 mg/dL — ABNORMAL HIGH (ref 70–99)
Potassium: 3.4 mmol/L — ABNORMAL LOW (ref 3.5–5.1)
Potassium: 3.6 mmol/L (ref 3.5–5.1)
Sodium: 128 mmol/L — ABNORMAL LOW (ref 135–145)
Sodium: 130 mmol/L — ABNORMAL LOW (ref 135–145)

## 2023-08-26 LAB — COOXEMETRY PANEL
Carboxyhemoglobin: 2 % — ABNORMAL HIGH (ref 0.5–1.5)
Methemoglobin: <0.7 % (ref 0.0–1.5)
O2 Saturation: 60.2 %
Total hemoglobin: 9.1 g/dL — ABNORMAL LOW (ref 12.0–16.0)

## 2023-08-26 LAB — PROTIME-INR
INR: 1.5 — ABNORMAL HIGH (ref 0.8–1.2)
Prothrombin Time: 18.7 s — ABNORMAL HIGH (ref 11.4–15.2)

## 2023-08-26 LAB — GLUCOSE, CAPILLARY
Glucose-Capillary: 118 mg/dL — ABNORMAL HIGH (ref 70–99)
Glucose-Capillary: 136 mg/dL — ABNORMAL HIGH (ref 70–99)
Glucose-Capillary: 153 mg/dL — ABNORMAL HIGH (ref 70–99)
Glucose-Capillary: 160 mg/dL — ABNORMAL HIGH (ref 70–99)
Glucose-Capillary: 163 mg/dL — ABNORMAL HIGH (ref 70–99)
Glucose-Capillary: 82 mg/dL (ref 70–99)

## 2023-08-26 LAB — HEPARIN LEVEL (UNFRACTIONATED)
Heparin Unfractionated: 0.16 [IU]/mL — ABNORMAL LOW (ref 0.30–0.70)
Heparin Unfractionated: >1.1 [IU]/mL — ABNORMAL HIGH (ref 0.30–0.70)
Heparin Unfractionated: 0.26 [IU]/mL — ABNORMAL LOW (ref 0.30–0.70)

## 2023-08-26 LAB — BRAIN NATRIURETIC PEPTIDE: B Natriuretic Peptide: 375.3 pg/mL — ABNORMAL HIGH (ref 0.0–100.0)

## 2023-08-26 LAB — PROCALCITONIN: Procalcitonin: 4.88 ng/mL

## 2023-08-26 LAB — URIC ACID: Uric Acid, Serum: 7.7 mg/dL — ABNORMAL HIGH (ref 2.5–7.1)

## 2023-08-26 MED ORDER — FUROSEMIDE 10 MG/ML IJ SOLN
80.0000 mg | Freq: Two times a day (BID) | INTRAMUSCULAR | Status: AC
  Administered 2023-08-26 (×2): 80 mg via INTRAVENOUS
  Filled 2023-08-26 (×2): qty 8

## 2023-08-26 MED ORDER — WARFARIN SODIUM 5 MG PO TABS
5.0000 mg | ORAL_TABLET | Freq: Once | ORAL | Status: AC
  Administered 2023-08-26: 5 mg via ORAL
  Filled 2023-08-26: qty 1

## 2023-08-26 MED ORDER — SORBITOL 70 % SOLN
45.0000 mL | Freq: Once | Status: AC
  Administered 2023-08-26: 45 mL via ORAL
  Filled 2023-08-26: qty 60

## 2023-08-26 MED ORDER — METOLAZONE 2.5 MG PO TABS
2.5000 mg | ORAL_TABLET | Freq: Once | ORAL | Status: AC
  Administered 2023-08-26: 2.5 mg via ORAL
  Filled 2023-08-26: qty 1

## 2023-08-26 MED ORDER — PREDNISONE 20 MG PO TABS
40.0000 mg | ORAL_TABLET | Freq: Every day | ORAL | Status: AC
  Administered 2023-08-26 – 2023-08-28 (×3): 40 mg via ORAL
  Filled 2023-08-26 (×3): qty 2

## 2023-08-26 MED ORDER — POTASSIUM CHLORIDE 20 MEQ PO PACK
40.0000 meq | PACK | ORAL | Status: AC
Start: 2023-08-26 — End: 2023-08-26
  Administered 2023-08-26 (×3): 40 meq via ORAL
  Filled 2023-08-26 (×2): qty 2

## 2023-08-26 MED ORDER — POTASSIUM CHLORIDE CRYS ER 20 MEQ PO TBCR
40.0000 meq | EXTENDED_RELEASE_TABLET | Freq: Once | ORAL | Status: AC
  Administered 2023-08-26: 40 meq via ORAL
  Filled 2023-08-26: qty 2

## 2023-08-26 MED ORDER — POTASSIUM CHLORIDE 20 MEQ PO PACK: 40.0000 meq | PACK | ORAL | Status: DC

## 2023-08-26 MED ORDER — POTASSIUM CHLORIDE CRYS ER 20 MEQ PO TBCR: 40.0000 meq | EXTENDED_RELEASE_TABLET | Freq: Once | ORAL | Status: DC

## 2023-08-26 NOTE — TOC Progression Note (Addendum)
 Transition of Care [facility]) - Progression Note    Patient Details  Name: Kim Anthony [name]: [id number] Date of Birth: January 17, 1951  Transition of Care [facility]) CM/SW Contact  Nicanor Bake Phone Number: [phone] 08/26/2023, 3:00 PM  Clinical Narrative:   HF CSW met with pt at bedside. CSW stated that PT recommended SNF for patient. Pt stated that she is open to going to a SNF, but to call her daughter and discuss with her. Patient stated that she is not going back to [place]. Patient attempted to call daughter at bedside but she did not answer. Patient gave CSW permission for daughter to be contacted.   3:00 PM- HF CSW called and left a VM for pts daughter.   3:05 PM- CSW received a call back from pts daughter. Pt and daughter are both agreeable to SNF. CSW explained the SNF process. Pts daughter would like to review the accepted offer list.  The patient has been faxed out and SNF offers pending.   TOC will continue following.       Barriers to Discharge: Continued Medical Work up  Expected Discharge Plan and Services       Living arrangements for the past 2 months: Single Family Home                                       Social Determinants of Health (SDOH) Interventions SDOH Screenings   Food Insecurity: No Food Insecurity (08/18/2023)  Housing: Low Risk  (08/18/2023)  Transportation Needs: No Transportation Needs (08/18/2023)  Utilities: Not At Risk (08/18/2023)  Alcohol Screen: Low Risk  (12/01/2022)  Depression (PHQ2-9): Low Risk  (12/01/2022)  Financial Resource Strain: Low Risk  (12/01/2022)  Physical Activity: Inactive (12/01/2022)  Social Connections: Patient Declined (08/18/2023)  Stress: Stress Concern Present (12/01/2022)  Tobacco Use: High Risk (08/24/2023)    Readmission Risk Interventions    02/19/2022    3:36 PM  Readmission Risk Prevention Plan  Transportation Screening Complete  PCP or Specialist Appt within 3-5 Days Complete  HRI or Home Care  Consult Complete  Social Work Consult for Recovery Care Planning/Counseling Complete  Palliative Care Screening Not Applicable  Medication Review Oceanographer) Complete

## 2023-08-26 NOTE — Progress Notes (Signed)
 Occupational Therapy Treatment Patient Details Name: Kim Anthony [name]: [id number] DOB: March 02, 1951 Today's Date: 08/26/2023   History of present illness Pt is 73 yo presenting to ED 2/25 with melena and symptomatic anemia in setting of GI bleed and chronic coumadin use. Underwent R heart cath on 2/28 in setting of acute on chronic RV failure and pulmonary hypertension. EGD 3/4. PMH: gout, CAD, HFrEF, a fib, HTN, GERD, HLD, COPD, hx of breast cancer s/p L mastectomy and radiation/chemo.   OT comments  Pt with complaints of pain in R hand/wrist and B feet with touch and movement. Assisting very minimally with rolling and supine to sit. Fair sitting balance with flexed posture and forward head at EOB. UB dressing completed with max assist, total assist for LB dressing and pericare. Pt unaware of failed purewick. Patient will benefit from continued inpatient follow up therapy, <3 hours/day.      If plan is discharge home, recommend the following:  Two people to help with walking and/or transfers;A lot of help with bathing/dressing/bathroom;Two people to help with bathing/dressing/bathroom;Direct supervision/assist for medications management;Direct supervision/assist for financial management;Assist for transportation;Help with stairs or ramp for entrance   Equipment Recommendations  Other (comment) (defer to next venue)    Recommendations for Other Services      Precautions / Restrictions Precautions Precautions: Fall Restrictions Weight Bearing Restrictions Per Provider Order: No       Mobility Bed Mobility Overal bed mobility: Needs Assistance Bed Mobility: Supine to Sit, Sit to Supine, Rolling Rolling: Max assist   Supine to sit: Max assist Sit to supine: Max assist   General bed mobility comments: pt reluctant to move due to pain, decreased initiation, assist for all aspects    Transfers                   General transfer comment: deferred due to increased pain in  feet/legs     Balance Overall balance assessment: Needs assistance   Sitting balance-Leahy Scale: Fair Sitting balance - Comments: close supervision, flexed posture                                   ADL either performed or assessed with clinical judgement   ADL Overall ADL's : Needs assistance/impaired                 Upper Body Dressing : Maximal assistance;Sitting   Lower Body Dressing: Total assistance;Bed level       Toileting- Clothing Manipulation and Hygiene: Total assistance;Bed level         General ADL Comments: pt unaware purewick failed, assisted to remove mesh panties, clean periarea, change gown and linens    Extremity/Trunk Assessment              Vision       Perception     Praxis     Communication Communication Communication: No apparent difficulties   Cognition Arousal: Alert Behavior During Therapy: WFL for tasks assessed/performed Cognition: Cognition impaired     Awareness: Online awareness impaired Memory impairment (select all impairments): Working Civil [employer] fast streamer, Armed forces training and education officer functioning impairment (select all impairments): Sequencing, Initiation                   Following commands: Impaired Following commands impaired: Follows one step commands with increased time      Cueing   Cueing Techniques: Verbal cues, Gestural cues  Exercises      Shoulder Instructions       General Comments      Pertinent Vitals/ Pain       Pain Assessment Pain Assessment: Faces Faces Pain Scale: Hurts even more Pain Location: R hand/wrist, feet, generalized with movement Pain Descriptors / Indicators: Grimacing, Guarding, Moaning Pain Intervention(s): Repositioned, Monitored during session, Patient requesting pain meds-RN notified  Home Living                                          Prior Functioning/Environment              Frequency  Min 2X/week         Progress Toward Goals  OT Goals(current goals can now be found in the care plan section)  Progress towards OT goals: Progressing toward goals  Acute Rehab OT Goals OT Goal Formulation: With patient Time For Goal Achievement: 09/06/23 Potential to Achieve Goals: Good  Plan      Co-evaluation                 AM-PAC OT "6 Clicks" Daily Activity     Outcome Measure   Help from another person eating meals?: A Little Help from another person taking care of personal grooming?: A Little Help from another person toileting, which includes using toliet, bedpan, or urinal?: Total Help from another person bathing (including washing, rinsing, drying)?: A Lot Help from another person to put on and taking off regular upper body clothing?: A Lot Help from another person to put on and taking off regular lower body clothing?: Total 6 Click Score: 12    End of Session    OT Visit Diagnosis: Unsteadiness on feet (R26.81);Other abnormalities of gait and mobility (R26.89);Muscle weakness (generalized) (M62.81)   Activity Tolerance Patient limited by pain;Patient limited by fatigue   Patient Left in bed;with call bell/phone within reach;with nursing/sitter in room   Nurse Communication Patient requests pain meds;Mobility status        Time: 2952-8413 OT Time Calculation (min): 36 min  Charges: OT General Charges $OT Visit: 1 Visit OT Treatments $Self Care/Home Management : 23-37 mins  Berna Spare, OTR/L Acute Rehabilitation Services Office: [phone]   Evern Bio 08/26/2023, 12:54 PM

## 2023-08-26 NOTE — Progress Notes (Addendum)
 NAME:  Kim Anthony, [name]:  [id number], DOB:  1950-12-03, LOS: 8 ADMISSION DATE:  08/17/2023, CONSULTATION DATE:  08/18/23 REFERRING MD:  Dr. Lily Kocher, CHIEF COMPLAINT:  GI bleed   History of Present Illness:  73 year old female patient with multiple comorbidities as below, most prominently with mechanical mitral valve on chronic Coumadin therapy, A-fib present with lower GI bleed, bloody BMs, dizziness and weakness.   Of note patient has had previous episodes of melena, had colonoscopy in 2019 showing 20 mm polyp in internal hemorrhoids. Patient admits to recent NSAID use for past few weeks BID, has chronic pain, multiple complaints, critical care asked to see this patient due to symptomatic anemia and concern for worsening GI bleed.   Case is discussed with the hospitalist over the phone, is concerned about her IV access she currently has 2 peripheral IVs, we can continue to use this for blood products and IV fluids.  RN notified bedside by myself that if patient's IV access worsens and we will place a central line bedside.  Otherwise, patient can be admitted to the ICU for closer observation.  Pertinent  Medical History   has a past medical history of Arthritis, Asthma, Breast cancer (HCC) (2001), CHF (congestive heart failure) (HCC), COPD (chronic obstructive pulmonary disease) (HCC), Coronary artery disease, GERD (gastroesophageal reflux disease), History of mitral valve replacement with mechanical valve, HLD (hyperlipidemia), Hypertension, and Pulmonary embolism (HCC) (2021).   Significant Hospital Events: Including procedures, antibiotic start and stop dates in addition to other pertinent events   2/25 admitted to IMTS for hypotension and melena likely acute GI bleed, transfer to ICU due to limited IV access and close monitoring s/p 1u PRBC, GI consulted 2/26 PICC placed  2/27 cardiology consulted, coox 59% and CVP elevated 3/3 EGD planned tomorrow on 3/4  3/4 On levophed, milrinone, amio, and  heparin.  EGD completed: Diffuse granular mucosa found at Entire stomach localized mild inflammation in the gastric antrum no ulceration in the stomach the duodenum was normal impression was gastritis.  Started on Flagyl and ceftriaxone, ? aspiration 3/5: Delirium persists.  Changed ceftriaxone and Flagyl to Zosyn 3/6 no distress more awake and alert reporting some right hand swelling and pain being treated for gout flare   Interim History / Subjective:  She is having a gout flare today, Right hand and wrist pain and swelling Heart failure team has added steroids  Objective   Blood pressure 102/64, pulse 90, temperature 97.6 F (36.4 C), temperature source Axillary, resp. rate (!) 23, height 5\' 5"  (1.651 m), weight 73 kg, SpO2 96%. CVP:  [4 mmHg-13 mmHg] 12 mmHg      Intake/Output Summary (Last 24 hours) at 08/26/2023 0749 Last data filed at 08/26/2023 0700 Gross per 24 hour  Intake 1805.29 ml  Output 1200 ml  Net 605.29 ml   Filed Weights   08/21/23 0600 08/23/23 0400 08/24/23 0827  Weight: 77.9 kg 73 kg 73 kg    Examination:   General pleasant 73 year old female patient sitting up in bed no acute distress eating breakfast HEENT normocephalic atraumatic no jugular venous distention is appreciated Pulmonary: Diminished both bases, currently on nasal cannula but weaning, no acute distress or accessory use Cardiac: Regular irregular, positive click from mechanical valve and audible heart murmur Abdomen soft nontender Extremities: Warm dry brisk cap refill the right hand and wrist is swollen with some discomfort and limited mobility GU clear yellow Neuro awake oriented today appropriate speech much more clear  Resolved Hospital Problem list  Lactic acidosis Anion gap metabolic acidosis Shock liver  Assessment & Plan:  Acute upper GI bleeding secondary to gastritis per EGD on 3/2 with associated Acute blood loss anemia on chronic anemia  Received 1 unit PRBCs during this  admission, CBC has been fairly stable since admission Plan Daily PPI per GI Awaiting her CBC  Acute on chronic RV failure with cardiogenic shock, complicated by septic shock HFimpEF (EF 55%) Pulmonary hypertension Advance HF team following, appreciate assistance, Co. oximetry remains in the 60s.  Vasoactive drip requirements worse, leukocytosis rising Plan Continue to trend central venous pressure, and Co. oximetry Continuing milrinone, defer management to heart failure Norepinephrine for mean arterial pressure greater than 65 Telemetry monitoring Will need to follow her clinically Continue to trend procalcitonin Day #2 Zosyn, tentatively planning for 5-day course for aspiration, versus transient gut ischemia (had abdominal discomfort on 3/4)  AKI on CKD 3a due to cardiorenal syndrome, hyponatremia and hypokalemia Serum creatinine stable Plan Strict intake output Renal dose medications Ensure optimized cardiac output Mean arterial pressure goal greater than 65 Serial chemistries Heart failure adjusting diuretic regimen, serum sodium would suggest degree of volume overload Potassium replacement  Demand cardiac ischemia Prolonged QT EKG showed no ST or T wave changes No chest pain Plan Continue to hold antiplatelets  Continue to monitor QTC on cardiac monitor   Paroxysmal A-fib status post cardioversion 9/23 Mechanical mitral valve replacement on Coumadin Remain in sinus rhythm Continue telemetry monitoring Plan Continue amio gtt Continuing IV heparin for bridge well warfarin therapy is being resumed    COPD, not in exacerbation plan Continue inhalers: continue breo ellipta, and incruse    Delirium Wonder if infection was contributing, seems a little better plan Continuing twice daily Seroquel  Chronic back pain Postherpetic neuralgia Chronic opiate use Anxiety disorder Gout with flare on 3/6 Patient was taking BID NSAID for past few weeks  Received zyprexa  and klonopin overnight 3/2  plan Hold off NSAIDs in setting of GI bleed, I do not think we should resume these given gastritis Continue current pain regiment PRN  delirium precautions  PT/OT to evaluate  Up to chair daily  Steroids started 3/6 for gout flare  Prediabetes A1c is 6 Blood sugars are better controlled Plan Continue SSI, CBG goal 140 to 180    Best Practice (right click and "Reselect all SmartList Selections" daily)   Diet/type: Regular consistency DVT prophylaxis systemic heparin adding coumadin Pressure ulcer(s): N/A GI prophylaxis: PPI Lines: PICC, yes still needed Foley:  N/A Code Status:  full code Last date of multidisciplinary goals of care discussion [ updated daughter at bedside 3/2 ]  Critical care time:  31 min

## 2023-08-26 NOTE — Progress Notes (Signed)
 Advanced Heart Failure Rounding Note  Cardiologist: Arvilla Meres, MD   Chief Complaint: RV Failure  Subjective:    Delirium improving. Much more alert this morning.   No dyspnea at rest. Reports right hand/wrist pain and swelling. Notes history of recurrent gout.  Objective:   CVP 12  Weight Range: 73 kg Body mass index is 26.78 kg/m.   Vital Signs:   Temp:  [96.2 F (35.7 C)-98.2 F (36.8 C)] 97.9 F (36.6 C) (03/06 0325) Pulse Rate:  [78-103] 85 (03/06 0645) Resp:  [13-41] 19 (03/06 0645) BP: (80-119)/(53-109) 91/66 (03/06 0645) SpO2:  [88 %-100 %] 96 % (03/06 0645) Last BM Date : 08/20/23  Weight change: Filed Weights   08/21/23 0600 08/23/23 0400 08/24/23 0827  Weight: 77.9 kg 73 kg 73 kg   Intake/Output:   Intake/Output Summary (Last 24 hours) at 08/26/2023 0706 Last data filed at 08/26/2023 0600 Gross per 24 hour  Intake 1730.58 ml  Output 1200 ml  Net 530.58 ml     Physical Exam    General:  Chronically ill appearing elderly female Neck: JVP 12 Cor: Regular rate & rhythm. Mechanical S1. + systolic murmur Lungs: CTA Abdomen: soft, nontender, + distended.  Extremities: R hand swollen and tender to palpation with decreased ROM at wrist, trace LE edema Neuro: alert & orientedx3. Affect pleasant    Telemetry   ST 100s, PVCs  EKG    N/A  Labs    CBC Recent Labs    08/24/23 0249 08/25/23 0542  WBC 19.9* 24.3*  HGB 8.7* 8.1*  HCT 26.5* 24.9*  MCV 86.9 87.7  PLT 428* 441*   Basic Metabolic Panel Recent Labs    91/47/82 0249 08/25/23 0542 08/26/23 0404  NA 129* 132* 128*  K 3.5 3.9 3.4*  CL 93* 95* 92*  CO2 24 22 23   GLUCOSE 137* 94 78  BUN 14 20 22   CREATININE 1.05* 1.30* 1.32*  CALCIUM 8.2* 8.4* 8.1*  MG 1.6* 2.2  --   PHOS 3.0 4.3  --    Liver Function Tests Recent Labs    08/23/23 1417 08/25/23 0542  AST 28 23  ALT 14 11  ALKPHOS 127* 108  BILITOT 2.1* 2.7*  PROT 6.3* 6.4*  ALBUMIN 2.1* 2.0*   No  results for input(s): "LIPASE", "AMYLASE" in the last 72 hours.  Cardiac Enzymes No results for input(s): "CKTOTAL", "CKMB", "CKMBINDEX", "TROPONINI" in the last 72 hours.  BNP: BNP (last 3 results) Recent Labs    08/17/23 2035 08/25/23 1009 08/26/23 0404  BNP 547.9* 506.9* 375.3*   Other results:    Medications:    Scheduled Medications:  Chlorhexidine Gluconate Cloth  6 each Topical Daily   docusate sodium  100 mg Oral BID   feeding supplement  237 mL Oral BID BM   fluticasone furoate-vilanterol  1 puff Inhalation Daily   And   umeclidinium bromide  1 puff Inhalation Daily   insulin aspart  0-15 Units Subcutaneous Q4H   pantoprazole  40 mg Oral Daily   polyethylene glycol  17 g Oral BID   potassium chloride  40 mEq Oral Once   QUEtiapine  25 mg Oral BID   sennosides  5 mL Oral BID   Warfarin - Pharmacist Dosing Inpatient   Does not apply q1600    Infusions:  sodium chloride Stopped (08/20/23 1159)   amiodarone 30 mg/hr (08/26/23 0600)   heparin 2,200 Units/hr (08/26/23 0600)   milrinone 0.125 mcg/kg/min (08/26/23 0600)  norepinephrine (LEVOPHED) Adult infusion 7 mcg/min (08/26/23 0600)   piperacillin-tazobactam (ZOSYN)  IV 12.5 mL/hr at 08/26/23 0600    PRN Medications: sodium chloride, acetaminophen, HYDROmorphone, ondansetron (ZOFRAN) IV, mouth rinse  Patient Profile   Kim Anthony is a 73 y.o. female with a history of mechanical MVR in 2001 in [place], [place], CAD, hx breast cancer s/p left mastectomy and radiation/chemo, tobacco use, COPD, prior PE/DVT, HTN, undergoing workup for possible inflammatory arthritis with Rheumatology, and systolic CHF.   Assessment/Plan   1. Acute on chronic RV Failure HFimpEF Pulmonary HTN  - Suspect likely underlying OSA contributing to PH/RV failure. Also w/ known COPD. - Echo this admit EF 55% and D-shaped septum, RV-RA gradient 25 mmHg and severe TR (does not coapt) - NYHA III-IV on admit - CO-OX 60% on 0.125 milrinone +  7 NE. Wean NE today, target MAP > 65 or SBP > 90. Discussed with RN at bedside. - CVP 12. Diuresis not very brisk yesterday. Give 80 mg lasix IV + 2.5 mg metolazone.   2. AKI/hypokalemia - in the setting of RV congestion - Scr up slightly last couple of days, around 1.3. Baseline < 1.  - Needs further diuresis   3. PAF - She does not do well in AFL/AF. - s/p TEE and DCCV 9/23 to NSR. - On/Off PO amio for prolonged QTc - Recent admit 2/25 with AFL, converted to NSR on amio, but then stopped d/t long QT - Warfarin has been restarted, INR 1.5. Continue heparin gtt. - NSR on telemetry. Continue IV amiodarone while on milrinone.    4. CAD  Elevated Troponin - 1 V CAD with occluded mid RCA on cath (7/22). - hsTrop [id number]. Likely demand ischemia, decongest and follow - EKG without ST changes - No s/s angina. - on heparin - continue statin  5. GI bleed Anemia  - hypotension with melena requiring RBCs - EGD 03/04 with gastritis, no other significant findings - AM CBC pending    6. Hx of mechanical mitral valve replacement: - Mechanical MVR in [place] in 2001. - Stable on echo 2/25 MV gradient 3 - Continue Warfarin and heparin. PharmD dosing warfarin.  - Continue SBE prophylaxis.   7. Tricuspid valve regurgitation - severe on echo 2/25 - Follow    8. Right hand/wrist pain Hx gout -Takes prednisone and NSAIDs at home -Avoid NSAIDs -Suspect acute flare. Give 40 mg prednisone X 3 days.  -Check uric acid  9. Hyponatremia - Hypervolemic hyponatremia - Na 132>128, diurese today  10. ID - Ceftriaxone and Flagyl switched to Zosyn 03/05 per CCM - ? Aspiration PNA - PCT 5.14>4.88 - BC X 2 pending - WBCs up to 24K on 03/05, AM CBC pending. Afebrile.   11. Hypokalemia - K 3.4 - Supp with diuresis   Length of Stay: 8  Anna Genre, PA-C 7:06 AM  CRITICAL CARE Performed by: Maximino Sarin   Total critical care time: 20 minutes  Critical care time was  exclusive of separately billable procedures and treating other patients.  Critical care was necessary to treat or prevent imminent or life-threatening deterioration.  Critical care was time spent personally by me on the following activities: development of treatment plan with patient and/or surrogate as well as nursing, discussions with consultants, evaluation of patient's response to treatment, examination of patient, obtaining history from patient or surrogate, ordering and performing treatments and interventions, ordering and review of laboratory studies, ordering and review of radiographic studies, pulse oximetry and  re-evaluation of patient's condition.    Advanced Heart Failure Team Pager [phone] (M-F; 7a - 5p)  Please contact [facility] for night-coverage after hours (5p -7a ) and weekends on [email or web address]

## 2023-08-26 NOTE — Progress Notes (Addendum)
 PHARMACY - ANTICOAGULATION CONSULT NOTE  Pharmacy Consult for heparin Indication:  Mechanical mitral valve replacement  Allergies  Allergen Reactions   Lisinopril Swelling   Acetaminophen-Codeine Itching   Propoxyphene Itching    Darvocet   Tape Rash    Plastic   Tramadol Itching, Nausea And Vomiting and Rash    Patient Measurements: Height: 5\' 5"  (165.1 cm) Weight: 73 kg (160 lb 15 oz) IBW/kg (Calculated) : 57 Heparin Dosing Weight: TBW  Vital Signs: Temp: 97.9 F (36.6 C) (03/06 0325) Temp Source: Axillary (03/06 0325) BP: 91/66 (03/06 0645) Pulse Rate: 85 (03/06 0645)  Labs: Recent Labs    08/24/23 0249 08/24/23 2135 08/25/23 0542 08/25/23 2027 08/26/23 0404  HGB 8.7*  --  8.1*  --   --   HCT 26.5*  --  24.9*  --   --   PLT 428*  --  441*  --   --   LABPROT 16.4*  --  17.4*  --  18.7*  INR 1.3*  --  1.4*  --  1.5*  HEPARINUNFRC  --    < > 0.10* 0.24* 0.16*  CREATININE 1.05*  --  1.30*  --  1.32*   < > = values in this interval not displayed.    Estimated Creatinine Clearance: 38 mL/min (A) (by C-G formula based on SCr of 1.32 mg/dL (H)).   Medical History: Past Medical History:  Diagnosis Date   Arthritis    Asthma    Breast cancer (HCC) 2001   Left Breast Cancer   CHF (congestive heart failure) (HCC)    COPD (chronic obstructive pulmonary disease) (HCC)    Coronary artery disease    GERD (gastroesophageal reflux disease)    History of mitral valve replacement with mechanical valve    HLD (hyperlipidemia)    Hypertension    Pulmonary embolism (HCC) 2021     Assessment: 29 yoM with hx AFib and mMVR on warfarin (goal INR 2.5-3.5). Warfarin held on admit with melenic stools, heparin started for anticoagulation. EGD 3/4 with no bleeding, heparin and warfarin resumed postop.  INR 1.5, heparin level remains low and adjusted earlier this morning. Dosing warfarin cautiously while acutely ill. Will adjust heparin level goal to 0.3-0.7.  Home dose =  5mg  daily except 7.5mg  Mon/Thurs (5mg  daily per pt report)  Goal of Therapy:  Heparin level 0.3-0.7 Monitor platelets by anticoagulation protocol: Yes  Plan:  Heparin 2200 units/h Warfarin 5mg  x1 tonight Daily INR, heparin level, CBC   Fredonia Highland, PharmD, [place], [facility] Clinical Pharmacist [phone] Please check AMION for all [facility] numbers 08/26/2023

## 2023-08-26 NOTE — Progress Notes (Signed)
 [facility] ADULT ICU REPLACEMENT PROTOCOL   The patient does apply for the [facility] Adult ICU Electrolyte Replacment Protocol based on the criteria listed below:   1.Exclusion criteria: TCTS, ECMO, Dialysis, and Myasthenia Gravis patients 2. Is GFR >/= 30 ml/min? Yes.    Patient's GFR today is 43 3. Is SCr </= 2? Yes.   Patient's SCr is 1.32 mg/dL 4. Did SCr increase >/= 0.5 in 24 hours? No. 5.Pt's weight >40kg  Yes.   6. Abnormal electrolyte(s): K 3.4  7. Electrolytes replaced per protocol 8.  Call MD STAT for K+ </= 2.5, Phos </= 1, or Mag </= 1 Physician:  Jennette Dubin, Quindon Denker A 08/26/2023 6:03 AM

## 2023-08-26 NOTE — [place] FL2 (Signed)
 [place] MEDICAID FL2 LEVEL OF CARE FORM     IDENTIFICATION  Patient Name: Kim Anthony Birthdate: [birth year] Sex: female Admission Date (Current Location): 08/17/2023  [facility] and [place] Number:  Producer, television/film/video and Address:  [facility] [place], [address], [place], [place] [address]      Provider Number: [id number]  Attending Physician Name and Address:  Cheri Fowler, MD  Relative Name and Phone Number:  Altagracia, Rone Daughter [phone]    Current Level of Care: Hospital Recommended Level of Care: Skilled Nursing Facility Prior Approval Number:    Date Approved/Denied:   PASRR Number: [id number]  Discharge Plan: SNF    Current Diagnoses: Patient Active Problem List   Diagnosis Date Noted   Pulmonary hypertension, low resistance (HCC) 08/20/2023   Acute GI bleeding 08/18/2023   QT prolongation 08/18/2023   ABLA (acute blood loss anemia) 08/18/2023   Anemia 08/17/2023   History of mitral valve prosthesis 07/31/2023   Atrial flutter (HCC) 07/31/2023   Right ventricular failure (HCC) 07/31/2023   Acute hypoxic respiratory failure (HCC) 07/30/2023   Acute on chronic combined systolic and diastolic heart failure (HCC) 07/30/2023   CKD (chronic kidney disease) stage 3, GFR 30-59 ml/min (HCC) 07/30/2023   Gout 07/30/2023   Post herpetic neuralgia 07/30/2023   Atrial fibrillation with RVR (HCC) 07/29/2023   Arthritis of right wrist due to gout 07/23/2023   Wrist arthritis 07/23/2023   Asymmetrical sensorineural hearing loss 01/27/2023   Bilateral hearing loss 12/01/2022   Hypokalemia 11/18/2022   Thrombocytosis 10/16/2022   Hyperkalemia 10/08/2022   Acute renal failure (HCC) 10/07/2022   Alteration in anticoagulation 09/25/2022   History of mitral valve replacement with mechanical valve 09/24/2022   Warfarin anticoagulation 09/24/2022   Closed fracture of right hip (HCC) 09/23/2022   Closed intertrochanteric fracture of right femur,  initial encounter (HCC) 09/22/2022   H/O mitral valve replacement with mechanical valve 09/22/2022   Chronic HFrEF (heart failure with reduced ejection fraction) (HCC) 09/22/2022   Endometrial thickening on ultrasound 07/07/2022   Gastroesophageal reflux disease without esophagitis 06/29/2022   Chronic pain of both knees 06/29/2022   Vaginal bleeding 04/01/2022   A-fib (HCC) 03/13/2022   Facial edema 03/07/2022   Atypical atrial flutter (HCC)    Combined systolic and diastolic heart failure (HCC) 02/19/2022   CHF (congestive heart failure) (HCC) 02/19/2022   Combined systolic and diastolic congestive heart failure (HCC) 02/18/2022   Acute pulmonary edema (HCC)    Edema of both lower extremities 01/13/2022   Hypotension 01/13/2022   Subacute cough 11/28/2021   COPD exacerbation (HCC) 11/05/2021   Conjunctival hemorrhage of left eye 06/25/2021   Cellulitis of right knee 05/12/2021   Osteoarthritis of knees, bilateral 05/12/2021   Osteoarthritis of left knee 04/24/2021   Bunion 03/18/2021   Inflammatory polyarthropathy (HCC) 03/04/2021   Seronegative inflammatory arthritis 02/17/2021   High risk medication use 02/17/2021   Elevated transaminase level 01/27/2021   Pain in right hand 01/16/2021   Acute idiopathic gout of multiple sites 09/30/2020   Pain in joint of left knee 09/27/2020   Right wrist pain 05/29/2020   Ganglion of right wrist 05/29/2020   Chronic pain syndrome 01/17/2020   Monoarthritis of wrist 10/18/2019   Ganglion cyst 09/21/2019   Hip pain 08/23/2019   Thoracic ascending aortic aneurysm (HCC) 07/05/2019   Macrocytic anemia 02/16/2019   Breast cancer (HCC)    Acquired absence of left breast 12/20/2018   COPD (chronic obstructive pulmonary  disease) (HCC) 11/25/2017   S/P mitral valve replacement    Chronic anticoagulation    Tobacco use disorder 02/25/2017   Lymphadenopathy, mediastinal 02/25/2017   Essential hypertension 02/01/2016   Allergic rhinitis  02/01/2016   Healthcare maintenance 01/30/2016   Chronic low back pain 07/22/2015   Pulmonary embolism (HCC) 07/11/2015   Biventricular heart failure with reduced left ventricular function (HCC) 07/11/2015   Headache, common migraine 07/11/2015   HLD (hyperlipidemia) 07/11/2015    Orientation RESPIRATION BLADDER Height & Weight     Self, Situation, Time, Place  O2 Incontinent Weight: 160 lb 15 oz (73 kg) Height:  5\' 5"  (165.1 cm)  BEHAVIORAL SYMPTOMS/MOOD NEUROLOGICAL BOWEL NUTRITION STATUS      Continent Diet (See dc summary)  AMBULATORY STATUS COMMUNICATION OF NEEDS Skin   Limited Assist Verbally PU Stage and Appropriate Care (Wound / Incision (Open or Dehisced) 01/06/21 Breast Left Masectomy Site)                       Personal Care Assistance Level of Assistance  Bathing, Feeding, Dressing, Total care Bathing Assistance: Limited assistance Feeding assistance: Limited assistance Dressing Assistance: Limited assistance Total Care Assistance: Limited assistance   Functional Limitations Info  Sight, Hearing, Speech Sight Info: Impaired Hearing Info: Adequate Speech Info: Adequate    SPECIAL CARE FACTORS FREQUENCY  PT (By licensed PT), OT (By licensed OT)     PT Frequency: 5x weekly OT Frequency: 5x weekly            Contractures      Additional Factors Info  Code Status, Allergies Code Status Info: Full Allergies Info: Lisinopril; Acetaminophen-codeine;Propoxyphene;Tape; Tramadol           Current Medications (08/26/2023):  This is the current hospital active medication list Current Facility-Administered Medications  Medication Dose Route Frequency Provider Last Rate Last Admin   0.9 %  sodium chloride infusion (Manually program via Guardrails IV Fluids)   Intravenous Continuous Lynann Bologna, DO   Held at 08/20/23 1159   0.9 %  sodium chloride infusion (Manually program via Guardrails IV Fluids)   Intravenous PRN Lynann Bologna, DO        acetaminophen (TYLENOL) tablet 650 mg  650 mg Oral Q4H PRN Lynann Bologna, DO   650 mg at 08/21/23 1933   amiodarone (NEXTERONE PREMIX) 360-4.14 MG/200ML-% (1.8 mg/mL) IV infusion  30 mg/hr Intravenous Continuous Lynann Bologna, DO 16.67 mL/hr at 08/26/23 1127 30 mg/hr at 08/26/23 1127   Chlorhexidine Gluconate Cloth 2 % PADS 6 each  6 each Topical Daily Liliane Shi H, DO   6 each at 08/26/23 1000   docusate sodium (COLACE) capsule 100 mg  100 mg Oral BID Liliane Shi H, DO   100 mg at 08/26/23 1100   feeding supplement (ENSURE ENLIVE / ENSURE PLUS) liquid 237 mL  237 mL Oral BID BM Chand, Sudham, MD   237 mL at 08/26/23 1000   fluticasone furoate-vilanterol (BREO ELLIPTA) 100-25 MCG/ACT 1 puff  1 puff Inhalation Daily Liliane Shi H, DO   1 puff at 08/26/23 1610   And   umeclidinium bromide (INCRUSE ELLIPTA) 62.5 MCG/ACT 1 puff  1 puff Inhalation Daily Liliane Shi H, DO   1 puff at 08/26/23 0824   furosemide (LASIX) injection 80 mg  80 mg Intravenous BID Andrey Farmer, PA-C   80 mg at 08/26/23 0756   heparin ADULT infusion 100 units/mL (25000 units/253mL)  2,200 Units/hr Intravenous Continuous  Juliette Mangle, RPH 22 mL/hr at 08/26/23 0800 2,200 Units/hr at 08/26/23 0800   HYDROmorphone (DILAUDID) tablet 1 mg  1 mg Oral Q4H PRN Liliane Shi H, DO   1 mg at 08/26/23 1124   insulin aspart (novoLOG) injection 0-15 Units  0-15 Units Subcutaneous Q4H Lynann Bologna, DO   3 Units at 08/26/23 1220   milrinone (PRIMACOR) 20 MG/100 ML (0.2 mg/mL) infusion  0.125 mcg/kg/min Intravenous Continuous Lynann Bologna, DO 1.93 mL/hr at 08/26/23 0800 0.125 mcg/kg/min at 08/26/23 0800   norepinephrine (LEVOPHED) 4mg  in (0.016 mg/mL) premix infusion  0-40 mcg/min Intravenous Titrated Andrey Farmer, PA-C 18.75 mL/hr at 08/26/23 0800 5 mcg/min at 08/26/23 0800   ondansetron (ZOFRAN) injection 4 mg  4 mg Intravenous Q6H PRN Liliane Shi H, DO   4 mg at 08/18/23  1610   Oral care mouth rinse  15 mL Mouth Rinse PRN Liliane Shi H, DO       pantoprazole (PROTONIX) EC tablet 40 mg  40 mg Oral Daily Liliane Shi H, DO   40 mg at 08/26/23 1100   piperacillin-tazobactam (ZOSYN) IVPB 3.375 g  3.375 g Intravenous Q8H Chand, Garnet Sierras, MD 12.5 mL/hr at 08/26/23 1126 3.375 g at 08/26/23 1126   polyethylene glycol (MIRALAX / GLYCOLAX) packet 17 g  17 g Oral BID Liliane Shi H, DO   17 g at 08/26/23 1100   potassium chloride (KLOR-CON) packet 40 mEq  40 mEq Oral Q4H Andrey Farmer, [place]   40 mEq at 08/26/23 1220   predniSONE (DELTASONE) tablet 40 mg  40 mg Oral Q breakfast Andrey Farmer, [place]   40 mg at 08/26/23 0756   QUEtiapine (SEROQUEL) tablet 25 mg  25 mg Oral BID Lynann Bologna, DO   25 mg at 08/24/23 2159   sennosides (SENOKOT) 8.8 MG/5ML syrup 5 mL  5 mL Oral BID Liliane Shi H, DO   5 mL at 08/26/23 1100   warfarin (COUMADIN) tablet 5 mg  5 mg Oral ONCE-1600 Mosetta Anis, [facility]       Warfarin - Pharmacist Dosing Inpatient   Does not apply [id number] Mosetta Anis, [facility]         Discharge Medications: Please see discharge summary for a list of discharge medications.  Relevant Imaging Results:  Relevant Lab Results:   Additional Information SSN: [id number]  Reva Bores, LCSWA

## 2023-08-26 NOTE — Progress Notes (Signed)
 PHARMACY - ANTICOAGULATION CONSULT NOTE  Pharmacy Consult for heparin Indication:  Mechanical mitral valve replacement  Allergies  Allergen Reactions   Lisinopril Swelling   Acetaminophen-Codeine Itching   Propoxyphene Itching    Darvocet   Tape Rash    Plastic   Tramadol Itching, Nausea And Vomiting and Rash    Patient Measurements: Height: 5\' 5"  (165.1 cm) Weight: 73 kg (160 lb 15 oz) IBW/kg (Calculated) : 57 Heparin Dosing Weight: TBW  Vital Signs: Temp: 98.1 F (36.7 C) (03/06 1100) Temp Source: Oral (03/06 1100) BP: 98/59 (03/06 1500) Pulse Rate: 87 (03/06 1500)  Labs: Recent Labs    08/24/23 0249 08/24/23 2135 08/25/23 0542 08/25/23 2027 08/26/23 0404 08/26/23 0936 08/26/23 1417  HGB 8.7*  --  8.1*  --   --  7.9*  --   HCT 26.5*  --  24.9*  --   --  24.2*  --   PLT 428*  --  441*  --   --  466*  --   LABPROT 16.4*  --  17.4*  --  18.7*  --   --   INR 1.3*  --  1.4*  --  1.5*  --   --   HEPARINUNFRC  --    < > 0.10* 0.24* 0.16*  --  >1.10*  CREATININE 1.05*  --  1.30*  --  1.32*  --   --    < > = values in this interval not displayed.    Estimated Creatinine Clearance: 38 mL/min (A) (by C-G formula based on SCr of 1.32 mg/dL (H)).   Medical History: Past Medical History:  Diagnosis Date   Arthritis    Asthma    Breast cancer (HCC) 2001   Left Breast Cancer   CHF (congestive heart failure) (HCC)    COPD (chronic obstructive pulmonary disease) (HCC)    Coronary artery disease    GERD (gastroesophageal reflux disease)    History of mitral valve replacement with mechanical valve    HLD (hyperlipidemia)    Hypertension    Pulmonary embolism (HCC) 2021     Assessment: 50 yoM with hx AFib and mMVR on warfarin (goal INR 2.5-3.5). Warfarin held on admit with melenic stools, heparin started for anticoagulation. EGD 3/4 with no bleeding, heparin and warfarin resumed postop.  INR 1.5. dosing warfarin cautiously while acutely ill.  Heparin level >1.1  was drawn from PICC line where heparin is running and likely contaminated. Redraw 0.26 is subtherapeutic on 2200 units/hr.  No issues with infusion or bleeding per RN.  Home dose = 5mg  daily except 7.5mg  Mon/Thurs (5mg  daily per pt report)  Goal of Therapy:  Heparin level 0.3-0.7 Monitor platelets by anticoagulation protocol: Yes  Plan:  Increase Heparin 2400 units/h (= 32.9 units/kg/h) F/u 8hr heparin level  Warfarin 5mg  x1 tonight Daily INR, heparin level, CBC  Alphia Moh, PharmD, BCPS, BCCP Clinical Pharmacist  Please check AMION for all [facility] Pharmacy phone numbers After 10:00 PM, call Main Pharmacy [phone]

## 2023-08-26 NOTE — Progress Notes (Signed)
 PHARMACY - ANTICOAGULATION CONSULT NOTE  Pharmacy Consult for heparin Indication:  MVR  Labs: Recent Labs    08/24/23 0249 08/24/23 2135 08/25/23 0542 08/25/23 2027 08/26/23 0404  HGB 8.7*  --  8.1*  --   --   HCT 26.5*  --  24.9*  --   --   PLT 428*  --  441*  --   --   LABPROT 16.4*  --  17.4*  --  18.7*  INR 1.3*  --  1.4*  --  1.5*  HEPARINUNFRC  --    < > 0.10* 0.24* 0.16*  CREATININE 1.05*  --  1.30*  --  1.32*   < > = values in this interval not displayed.   Assessment: 72yo female subtherapeutic on heparin with lower heparin level despite increased rate; no infusion issues or signs of bleeding per RN.  Goal of Therapy:  Heparin level 0.3-0.5 units/ml   Plan:  Increase heparin infusion by 10% to 2200 units/hr. Check level in 8 hours.   Vernard Gambles, PharmD, BCPS 08/26/2023 5:54 AM

## 2023-08-27 ENCOUNTER — Inpatient Hospital Stay (HOSPITAL_COMMUNITY)

## 2023-08-27 DIAGNOSIS — D62 Acute posthemorrhagic anemia: Secondary | ICD-10-CM | POA: Diagnosis not present

## 2023-08-27 DIAGNOSIS — N179 Acute kidney failure, unspecified: Secondary | ICD-10-CM | POA: Diagnosis not present

## 2023-08-27 DIAGNOSIS — K922 Gastrointestinal hemorrhage, unspecified: Secondary | ICD-10-CM | POA: Diagnosis not present

## 2023-08-27 DIAGNOSIS — I50814 Right heart failure due to left heart failure: Secondary | ICD-10-CM | POA: Diagnosis not present

## 2023-08-27 DIAGNOSIS — J9601 Acute respiratory failure with hypoxia: Secondary | ICD-10-CM

## 2023-08-27 LAB — CBC
HCT: 22.8 % — ABNORMAL LOW (ref 36.0–46.0)
Hemoglobin: 7.6 g/dL — ABNORMAL LOW (ref 12.0–15.0)
MCH: 28.3 pg (ref 26.0–34.0)
MCHC: 33.3 g/dL (ref 30.0–36.0)
MCV: 84.8 fL (ref 80.0–100.0)
Platelets: 439 10*3/uL — ABNORMAL HIGH (ref 150–400)
RBC: 2.69 MIL/uL — ABNORMAL LOW (ref 3.87–5.11)
RDW: 18.4 % — ABNORMAL HIGH (ref 11.5–15.5)
WBC: 23.6 10*3/uL — ABNORMAL HIGH (ref 4.0–10.5)
nRBC: 0 % (ref 0.0–0.2)

## 2023-08-27 LAB — BASIC METABOLIC PANEL
Anion gap: 14 (ref 5–15)
Anion gap: 15 (ref 5–15)
BUN: 18 mg/dL (ref 8–23)
BUN: 18 mg/dL (ref 8–23)
CO2: 24 mmol/L (ref 22–32)
CO2: 21 mmol/L — ABNORMAL LOW (ref 22–32)
Calcium: 8.2 mg/dL — ABNORMAL LOW (ref 8.9–10.3)
Calcium: 7.9 mg/dL — ABNORMAL LOW (ref 8.9–10.3)
Chloride: 91 mmol/L — ABNORMAL LOW (ref 98–111)
Chloride: 90 mmol/L — ABNORMAL LOW (ref 98–111)
Creatinine, Ser: 1.23 mg/dL — ABNORMAL HIGH (ref 0.44–1.00)
Creatinine, Ser: 1.39 mg/dL — ABNORMAL HIGH (ref 0.44–1.00)
GFR, Estimated: 46 mL/min — ABNORMAL LOW (ref >60)
GFR, Estimated: 40 mL/min — ABNORMAL LOW (ref >60)
Glucose, Bld: 108 mg/dL — ABNORMAL HIGH (ref 70–99)
Glucose, Bld: 216 mg/dL — ABNORMAL HIGH (ref 70–99)
Potassium: 3.7 mmol/L (ref 3.5–5.1)
Potassium: 3.1 mmol/L — ABNORMAL LOW (ref 3.5–5.1)
Sodium: 129 mmol/L — ABNORMAL LOW (ref 135–145)
Sodium: 126 mmol/L — ABNORMAL LOW (ref 135–145)

## 2023-08-27 LAB — COOXEMETRY PANEL
Carboxyhemoglobin: 1.9 % — ABNORMAL HIGH (ref 0.5–1.5)
Methemoglobin: 0.8 % (ref 0.0–1.5)
O2 Saturation: 60.5 %
Total hemoglobin: 8.7 g/dL — ABNORMAL LOW (ref 12.0–16.0)

## 2023-08-27 LAB — GLUCOSE, CAPILLARY
Glucose-Capillary: 113 mg/dL — ABNORMAL HIGH (ref 70–99)
Glucose-Capillary: 94 mg/dL (ref 70–99)
Glucose-Capillary: 117 mg/dL — ABNORMAL HIGH (ref 70–99)
Glucose-Capillary: 139 mg/dL — ABNORMAL HIGH (ref 70–99)
Glucose-Capillary: 173 mg/dL — ABNORMAL HIGH (ref 70–99)

## 2023-08-27 LAB — PROTIME-INR
INR: 2 — ABNORMAL HIGH (ref 0.8–1.2)
Prothrombin Time: 22.9 s — ABNORMAL HIGH (ref 11.4–15.2)

## 2023-08-27 LAB — PROCALCITONIN: Procalcitonin: 3 ng/mL

## 2023-08-27 LAB — HEPARIN LEVEL (UNFRACTIONATED)
Heparin Unfractionated: 0.48 [IU]/mL (ref 0.30–0.70)
Heparin Unfractionated: 0.57 [IU]/mL (ref 0.30–0.70)

## 2023-08-27 LAB — BRAIN NATRIURETIC PEPTIDE: B Natriuretic Peptide: 598.1 pg/mL — ABNORMAL HIGH (ref 0.0–100.0)

## 2023-08-27 MED ORDER — QUETIAPINE FUMARATE 50 MG PO TABS
25.0000 mg | ORAL_TABLET | Freq: Every day | ORAL | Status: DC
  Administered 2023-08-28 – 2023-09-06 (×10): 25 mg via ORAL
  Filled 2023-08-27 (×10): qty 1

## 2023-08-27 MED ORDER — POTASSIUM CHLORIDE CRYS ER 20 MEQ PO TBCR
40.0000 meq | EXTENDED_RELEASE_TABLET | Freq: Once | ORAL | Status: AC
  Administered 2023-08-27: 40 meq via ORAL
  Filled 2023-08-27: qty 2

## 2023-08-27 MED ORDER — WARFARIN SODIUM 2.5 MG PO TABS
2.5000 mg | ORAL_TABLET | Freq: Once | ORAL | Status: AC
  Administered 2023-08-27: 2.5 mg via ORAL
  Filled 2023-08-27: qty 1

## 2023-08-27 MED ORDER — FUROSEMIDE 10 MG/ML IJ SOLN
120.0000 mg | Freq: Once | INTRAVENOUS | Status: AC
  Administered 2023-08-27: 120 mg via INTRAVENOUS
  Filled 2023-08-27: qty 10

## 2023-08-27 MED ORDER — METOLAZONE 2.5 MG PO TABS
2.5000 mg | ORAL_TABLET | Freq: Once | ORAL | Status: AC
  Administered 2023-08-27: 2.5 mg via ORAL
  Filled 2023-08-27: qty 1

## 2023-08-27 MED ORDER — FUROSEMIDE 10 MG/ML IJ SOLN
80.0000 mg | Freq: Two times a day (BID) | INTRAMUSCULAR | Status: DC
  Administered 2023-08-27: 80 mg via INTRAVENOUS
  Filled 2023-08-27: qty 8

## 2023-08-27 MED ORDER — POTASSIUM CHLORIDE CRYS ER 20 MEQ PO TBCR
40.0000 meq | EXTENDED_RELEASE_TABLET | ORAL | Status: AC
Start: 2023-08-27 — End: 2023-08-27
  Administered 2023-08-27 (×2): 40 meq via ORAL
  Filled 2023-08-27 (×2): qty 2

## 2023-08-27 MED ORDER — POTASSIUM CHLORIDE CRYS ER 20 MEQ PO TBCR: 40.0000 meq | EXTENDED_RELEASE_TABLET | Freq: Once | ORAL | Status: DC

## 2023-08-27 NOTE — Progress Notes (Signed)
 NAME:  Kim Anthony, [name]:  [id number], DOB:  Oct 21, 1950, LOS: 9 ADMISSION DATE:  08/17/2023, CONSULTATION DATE:  08/18/23 REFERRING MD:  Dr. Lily Kocher, CHIEF COMPLAINT:  GI bleed   History of Present Illness:  73 year old female patient with multiple comorbidities as below, most prominently with mechanical mitral valve on chronic Coumadin therapy, A-fib present with lower GI bleed, bloody BMs, dizziness and weakness.   Of note patient has had previous episodes of melena, had colonoscopy in 2019 showing 20 mm polyp in internal hemorrhoids. Patient admits to recent NSAID use for past few weeks BID, has chronic pain, multiple complaints, critical care asked to see this patient due to symptomatic anemia and concern for worsening GI bleed.   Case is discussed with the hospitalist over the phone, is concerned about her IV access she currently has 2 peripheral IVs, we can continue to use this for blood products and IV fluids.  RN notified bedside by myself that if patient's IV access worsens and we will place a central line bedside.  Otherwise, patient can be admitted to the ICU for closer observation.  Pertinent  Medical History   has a past medical history of Arthritis, Asthma, Breast cancer (HCC) (2001), CHF (congestive heart failure) (HCC), COPD (chronic obstructive pulmonary disease) (HCC), Coronary artery disease, GERD (gastroesophageal reflux disease), History of mitral valve replacement with mechanical valve, HLD (hyperlipidemia), Hypertension, and Pulmonary embolism (HCC) (2021).   Significant Hospital Events: Including procedures, antibiotic start and stop dates in addition to other pertinent events   2/25 admitted to IMTS for hypotension and melena likely acute GI bleed, transfer to ICU due to limited IV access and close monitoring s/p 1u PRBC, GI consulted 2/26 PICC placed  2/27 cardiology consulted, coox 59% and CVP elevated 3/3 EGD planned tomorrow on 3/4  3/4 On levophed, milrinone, amio, and  heparin.  EGD completed: Diffuse granular mucosa found at Entire stomach localized mild inflammation in the gastric antrum no ulceration in the stomach the duodenum was normal impression was gastritis.  Started on Flagyl and ceftriaxone, ? aspiration 3/5: Delirium persists.  Changed ceftriaxone and Flagyl to Zosyn 3/6 no distress more awake and alert reporting some right hand swelling and pain being treated for gout flare   Interim History / Subjective:  Feels better today  Objective   Blood pressure (!) 105/58, pulse 74, temperature (!) 97.3 F (36.3 C), temperature source Oral, resp. rate (!) 25, height 5\' 5"  (1.651 m), weight 73 kg, SpO2 96%. CVP:  [10 mmHg-16 mmHg] 13 mmHg      Intake/Output Summary (Last 24 hours) at 08/27/2023 0758 Last data filed at 08/27/2023 0600 Gross per 24 hour  Intake 1617.86 ml  Output 3735 ml  Net -2117.14 ml   Filed Weights   08/21/23 0600 08/23/23 0400 08/24/23 0827  Weight: 77.9 kg 73 kg 73 kg    Examination:   General this a pleasant 73 year old female patient sitting up eating her breakfast she is in no acute distress HEENT normocephalic atraumatic no jugular venous distention is appreciated Pulmonary: Diminished bases some faint crackles noted posteriorly she is on 1 L nasal cannula no accessory use appreciated.  Last x-ray on the fourth showed bilateral pulmonary edema/airspace disease awaiting follow-up Cardiac: Regular irregular rhythm, atrial fibrillation, systolic murmur audible, positive click from her heart valve Abdomen soft nontender no organomegaly Extremities: Warm dry no significant lower extremity edema, the left hand/wrist is in a brace, swellings improved, the pain is better. Neuro: Awake oriented no focal  deficits this morning. GU clear yellow.  Resolved Hospital Problem list   Lactic acidosis Anion gap metabolic acidosis Shock liver  Assessment & Plan:  Acute upper GI bleeding secondary to gastritis per EGD on 3/2 with  associated Acute blood loss anemia on chronic anemia  Received 1 unit PRBCs during this admission, CBC has been fairly stable since admission Plan Daily PPI per GI Trend CBC  Acute on chronic RV failure with cardiogenic shock, complicated by septic shock HFimpEF (EF 55%) Pulmonary hypertension Advance HF team following, appreciate assistance, Co. oximetry remains in the 60s.  Vasoactive drip requirements worse, leukocytosis rising Plan Continue to trend CVP and Co. Oximetry's Milrinone as directed by heart failure Norepinephrine for mean arterial pressure greater than 65 Telemetry monitoring Continue to trend procalcitonin, this is improving Day #3 Zosyn, stop date placed for aspiration versus transient gut ischemia (had abdominal discomfort on 3/4).  Note I think her rising white blood cell count reflects steroids not worsening infectious process  AKI on CKD 3a due to cardiorenal syndrome with hypervolemic hyponatremia  Serum creatinine stable, continues to improve Plan Strict intake output Renal dose medications Ensure optimized cardiac output Mean arterial pressure goal greater than 65 Serial chemistries Agree with IV diuresis and addition of metolazone  Demand cardiac ischemia Prolonged QT EKG showed no ST or T wave changes No chest pain Plan Continue to hold antiplatelets  Continue to monitor QTC on cardiac monitor   Paroxysmal A-fib status post cardioversion 9/23 Mechanical mitral valve replacement on Coumadin Remain in sinus rhythm Continue telemetry monitoring Plan Continue amio gtt, deferring to heart failure Continuing IV heparin for bridge well warfarin therapy, therapeutic today with dose reduction noted for tonight    COPD, not in exacerbation plan Continue inhalers: continue breo ellipta, and incruse    Delirium Wonder if infection was contributing, seems a little better plan We can change the Seroquel to nightly  Chronic back pain Postherpetic  neuralgia Chronic opiate use Anxiety disorder Gout with flare on 3/6 Patient was taking BID NSAID for past few weeks  plan Hold off NSAIDs in setting of GI bleed, I do not think we should resume these given gastritis Continue current pain regiment PRN  delirium precautions  PT/OT to evaluate  Up to chair daily  Steroids started 3/6 for gout flare, continue for total of 3 doses  Prediabetes A1c is 6 Blood sugars are better controlled Plan Continue SSI, CBG goal 140 to 180    Best Practice (right click and "Reselect all SmartList Selections" daily)   Diet/type: Regular consistency DVT prophylaxis systemic heparin adding coumadin Pressure ulcer(s): N/A GI prophylaxis: PPI Lines: PICC, yes still needed Foley:  N/A Code Status:  full code Last date of multidisciplinary goals of care discussion [ updated daughter at bedside 3/2 ]  Critical care time NA my time 25 min

## 2023-08-27 NOTE — Progress Notes (Addendum)
 PHARMACY - ANTICOAGULATION CONSULT NOTE  Pharmacy Consult for heparin Indication:  Mechanical mitral valve replacement  Allergies  Allergen Reactions   Lisinopril Swelling   Acetaminophen-Codeine Itching   Propoxyphene Itching    Darvocet   Tape Rash    Plastic   Tramadol Itching, Nausea And Vomiting and Rash    Patient Measurements: Height: 5\' 5"  (165.1 cm) Weight: 73 kg (160 lb 15 oz) IBW/kg (Calculated) : 57 Heparin Dosing Weight: TBW  Vital Signs: Temp: 97.3 F (36.3 C) (03/07 0300) Temp Source: Oral (03/07 0300) BP: 105/58 (03/07 0600) Pulse Rate: 74 (03/07 0600)  Labs: Recent Labs    08/25/23 0542 08/25/23 2027 08/26/23 0404 08/26/23 0936 08/26/23 1417 08/26/23 1559 08/27/23 0027 08/27/23 0417  HGB 8.1*  --   --  7.9*  --   --   --  7.6*  HCT 24.9*  --   --  24.2*  --   --   --  22.8*  PLT 441*  --   --  466*  --   --   --  439*  LABPROT 17.4*  --  18.7*  --   --   --   --  22.9*  INR 1.4*  --  1.5*  --   --   --   --  2.0*  HEPARINUNFRC 0.10*   < > 0.16*  --  >1.10* 0.26* 0.48 0.57  CREATININE 1.30*  --  1.32*  --  1.40*  --   --  1.23*   < > = values in this interval not displayed.    Estimated Creatinine Clearance: 40.8 mL/min (A) (by C-G formula based on SCr of 1.23 mg/dL (H)).   Medical History: Past Medical History:  Diagnosis Date   Arthritis    Asthma    Breast cancer (HCC) 2001   Left Breast Cancer   CHF (congestive heart failure) (HCC)    COPD (chronic obstructive pulmonary disease) (HCC)    Coronary artery disease    GERD (gastroesophageal reflux disease)    History of mitral valve replacement with mechanical valve    HLD (hyperlipidemia)    Hypertension    Pulmonary embolism (HCC) 2021     Assessment: 76 yoM with hx AFib and mMVR on warfarin (goal INR 2.5-3.5). Warfarin held on admit with melenic stools, heparin started for anticoagulation. EGD 3/4 with no bleeding, heparin and warfarin resumed postop.  INR today is up to  2.0, heparin level therapeutic, CBC stable. Will need to reduce warfarin dosing given recent shock, GIB, and new amio.  Home dose = 5mg  daily except 7.5mg  Mon/Thurs (5mg  daily per pt report)  Goal of Therapy:  Heparin level 0.3-0.7 Monitor platelets by anticoagulation protocol: Yes  Plan:  Heparin 2400 units/h Warfarin 2.5mg  x1 tonight Daily INR, heparin level, CBC   Fredonia Highland, PharmD, [place], [facility] Clinical Pharmacist [phone] Please check AMION for all [facility] Pharmacy numbers 08/27/2023

## 2023-08-27 NOTE — Progress Notes (Addendum)
 Physical Therapy Treatment Patient Details Name: Kim Anthony [name]: [id number] DOB: 12/23/1950 Today's Date: 08/27/2023   History of Present Illness Pt is 73 yo presenting to ED 2/25 with melena and symptomatic anemia in setting of GI bleed and chronic coumadin use. Underwent R heart cath on 2/28 in setting of acute on chronic RV failure and pulmonary hypertension. EGD 3/4. PMH: gout, CAD, HFrEF, a fib, HTN, GERD, HLD, COPD, hx of breast cancer s/p L mastectomy and radiation/chemo.    PT Comments  Pt in bed upon arrival with family present and agreeable to PT session with encouragement. Pt improved in today's session by needing less assistance for bed mobility and transfers. Pt required ModA for supine/sit transfer for trunk elevation. Pt was then able to stand and perform step pivot transfer with ModAx2 and 2HH. Pt is progressing towards goals. Pt would continue to benefit from <3hrs post acute rehab. Acute PT to follow.   BP 112/72; 75 BPM 97% SpO2 on 1.5 L     If plan is discharge home, recommend the following: Assistance with cooking/housework;Assist for transportation;Help with stairs or ramp for entrance;A lot of help with walking and/or transfers;A lot of help with bathing/dressing/bathroom   Can travel by private vehicle     No  Equipment Recommendations  None recommended by PT       Precautions / Restrictions Precautions Precautions: Fall Restrictions Weight Bearing Restrictions Per Provider Order: No     Mobility  Bed Mobility Overal bed mobility: Needs Assistance Bed Mobility: Supine to Sit     Supine to sit: Mod assist     General bed mobility comments: pt able to move LE's off EOB, ModA for trunk elevation    Transfers Overall transfer level: Needs assistance Equipment used: 2 person hand held assist Transfers: Sit to/from Stand, Bed to chair/wheelchair/BSC Sit to Stand: Mod assist, +2 physical assistance, +2 safety/equipment   Step pivot transfers: Mod  assist, +2 physical assistance, +2 safety/equipment     General transfer comment: ModAx2 for boost up and steadying from EOB. Pt able to perform step pivot to recliner with ModAx2. Poor eccentric lowering into recliner    Ambulation/Gait    General Gait Details: unable this date       Balance Overall balance assessment: Needs assistance Sitting-balance support: No upper extremity supported, Feet supported Sitting balance-Leahy Scale: Fair     Standing balance support: Bilateral upper extremity supported, During functional activity, Reliant on assistive device for balance Standing balance-Leahy Scale: Poor Standing balance comment: reliant on external support       Communication Communication Communication: No apparent difficulties  Cognition Arousal: Alert Behavior During Therapy: WFL for tasks assessed/performed   PT - Cognitive impairments: No apparent impairments    Following commands: Impaired Following commands impaired: Follows one step commands with increased time    Cueing Cueing Techniques: Verbal cues  Exercises General Exercises - Lower Extremity Long Arc Quad: AROM, Both, 10 reps, Seated Hip Flexion/Marching: AROM, Both, 10 reps, Seated (limited ROM)        Pertinent Vitals/Pain Pain Assessment Pain Assessment: Faces Faces Pain Scale: Hurts even more Pain Location: R hand/wrist Pain Descriptors / Indicators: Grimacing, Guarding, Moaning Pain Intervention(s): Monitored during session, Limited activity within patient's tolerance, Repositioned     PT Goals (current goals can now be found in the care plan section) Acute Rehab PT Goals PT Goal Formulation: With patient Time For Goal Achievement: 09/08/23 Potential to Achieve Goals: Good Progress towards PT goals: Progressing toward  goals    Frequency    Min 3X/week       AM-PAC PT "6 Clicks" Mobility   Outcome Measure  Help needed turning from your back to your side while in a flat bed  without using bedrails?: A Lot Help needed moving from lying on your back to sitting on the side of a flat bed without using bedrails?: A Lot Help needed moving to and from a bed to a chair (including a wheelchair)?: A Lot Help needed standing up from a chair using your arms (e.g., wheelchair or bedside chair)?: A Lot Help needed to walk in hospital room?: Total Help needed climbing 3-5 steps with a railing? : Total 6 Click Score: 10    End of Session Equipment Utilized During Treatment: Gait belt Activity Tolerance: Patient tolerated treatment well Patient left: with call bell/phone within reach;in chair;with chair alarm set;with family/visitor present Nurse Communication: Mobility status PT Visit Diagnosis: Other abnormalities of gait and mobility (R26.89);Muscle weakness (generalized) (M62.81)     Time: 1610-9604 PT Time Calculation (min) (ACUTE ONLY): 25 min  Charges:    $Therapeutic Exercise: 8-22 mins $Therapeutic Activity: 8-22 mins PT General Charges $$ ACUTE PT VISIT: 1 Visit                     Hilton Cork, PT, DPT Secure Chat Preferred  Rehab Office [phone]   Arturo Morton Brion Aliment 08/27/2023, 2:20 PM

## 2023-08-27 NOTE — Progress Notes (Signed)
 PHARMACY - ANTICOAGULATION CONSULT NOTE  Pharmacy Consult for heparin Indication:  MVR  Labs: Recent Labs    08/24/23 0249 08/24/23 2135 08/25/23 0542 08/25/23 2027 08/26/23 0404 08/26/23 0936 08/26/23 1417 08/26/23 1559 08/27/23 0027  HGB 8.7*  --  8.1*  --   --  7.9*  --   --   --   HCT 26.5*  --  24.9*  --   --  24.2*  --   --   --   PLT 428*  --  441*  --   --  466*  --   --   --   LABPROT 16.4*  --  17.4*  --  18.7*  --   --   --   --   INR 1.3*  --  1.4*  --  1.5*  --   --   --   --   HEPARINUNFRC  --    < > 0.10*   < > 0.16*  --  >1.10* 0.26* 0.48  CREATININE 1.05*  --  1.30*  --  1.32*  --  1.40*  --   --    < > = values in this interval not displayed.   Assessment/Plan:  73yo female therapeutic on heparin after rate change. Will continue infusion at current rate of 2400 units/hr and confirm stable with additional level.  Vernard Gambles, PharmD, BCPS 08/27/2023 1:33 AM

## 2023-08-27 NOTE — Progress Notes (Addendum)
 Advanced Heart Failure Rounding Note  Cardiologist: Arvilla Meres, MD   Chief Complaint: RV Failure  Subjective:    Coox 60%. CVP 16-17. Net negative 2L yesterday with Lasix 80 mg bid + Metolazone 2.5 mg   Feeling well this morning. Pretty tired.  Breathing feels better. R wrist pain improving, brace in place.  Objective:    Weight Range: 73 kg Body mass index is 26.78 kg/m.   Vital Signs:   Temp:  [96 F (35.6 C)-98.1 F (36.7 C)] 97.3 F (36.3 C) (03/07 0300) Pulse Rate:  [69-94] 74 (03/07 0600) Resp:  [17-39] 25 (03/07 0600) BP: (70-122)/(37-92) 105/58 (03/07 0600) SpO2:  [88 %-100 %] 96 % (03/07 0600) Last BM Date : 08/26/23  Weight change: Filed Weights   08/21/23 0600 08/23/23 0400 08/24/23 0827  Weight: 77.9 kg 73 kg 73 kg   Intake/Output:   Intake/Output Summary (Last 24 hours) at 08/27/2023 0710 Last data filed at 08/27/2023 0600 Gross per 24 hour  Intake 1617.86 ml  Output 3735 ml  Net -2117.14 ml    CVP: 16-17  Physical Exam    General: Elderly appearing. No distress on [place] Cardiac: JVP ~10cm. S1 and S2 present. Mechanical click Resp: Lung sounds clear and equal B/L Extremities: Warm and dry. Trace peripheral edema. R hand - swollen/brace Neuro: Alert and oriented x3. Affect pleasant. Generalized weakness.  Telemetry   SR in 70-80s, occasional PVCs (personally reviewed)  EKG    N/A  Labs    CBC Recent Labs    08/26/23 0936 08/27/23 0417  WBC 20.2* 23.6*  HGB 7.9* 7.6*  HCT 24.2* 22.8*  MCV 85.2 84.8  PLT 466* 439*   Basic Metabolic Panel Recent Labs    16/10/96 0542 08/26/23 0404 08/26/23 1417 08/27/23 0417  NA 132*   < > 130* 129*  K 3.9   < > 3.6 3.7  CL 95*   < > 92* 91*  CO2 22   < > 21* 24  GLUCOSE 94   < > 168* 108*  BUN 20   < > 18 18  CREATININE 1.30*   < > 1.40* 1.23*  CALCIUM 8.4*   < > 8.0* 8.2*  MG 2.2  --   --   --   PHOS 4.3  --   --   --    < > = values in this interval not displayed.   Liver  Function Tests Recent Labs    08/25/23 0542  AST 23  ALT 11  ALKPHOS 108  BILITOT 2.7*  PROT 6.4*  ALBUMIN 2.0*   No results for input(s): "LIPASE", "AMYLASE" in the last 72 hours.  Cardiac Enzymes No results for input(s): "CKTOTAL", "CKMB", "CKMBINDEX", "TROPONINI" in the last 72 hours.  BNP: BNP (last 3 results) Recent Labs    08/25/23 1009 08/26/23 0404 08/27/23 0458  BNP 506.9* 375.3* 598.1*   Other results:  Medications:    Scheduled Medications:  Chlorhexidine Gluconate Cloth  6 each Topical Daily   docusate sodium  100 mg Oral BID   feeding supplement  237 mL Oral BID BM   fluticasone furoate-vilanterol  1 puff Inhalation Daily   And   umeclidinium bromide  1 puff Inhalation Daily   insulin aspart  0-15 Units Subcutaneous Q4H   pantoprazole  40 mg Oral Daily   polyethylene glycol  17 g Oral BID   predniSONE  40 mg Oral Q breakfast   QUEtiapine  25 mg Oral BID  sennosides  5 mL Oral BID   Warfarin - Pharmacist Dosing Inpatient   Does not apply q1600    Infusions:  sodium chloride Stopped (08/20/23 1159)   amiodarone 30 mg/hr (08/27/23 0600)   heparin 2,400 Units/hr (08/27/23 0629)   milrinone 0.125 mcg/kg/min (08/27/23 0600)   norepinephrine (LEVOPHED) Adult infusion 7 mcg/min (08/27/23 0600)   piperacillin-tazobactam (ZOSYN)  IV 12.5 mL/hr at 08/27/23 0600    PRN Medications: sodium chloride, acetaminophen, HYDROmorphone, ondansetron (ZOFRAN) IV, mouth rinse  Patient Profile   Kim Anthony is a 73 y.o. female with a history of mechanical MVR in 2001 in [place], [place], CAD, hx breast cancer s/p left mastectomy and radiation/chemo, tobacco use, COPD, prior PE/DVT, HTN, undergoing workup for possible inflammatory arthritis with Rheumatology, and systolic CHF.   Assessment/Plan   1. Acute on chronic RV Failure HFimpEF Pulmonary HTN  - Suspect likely underlying OSA contributing to PH/RV failure. Also w/ known COPD. - Echo this admit EF 55% and  D-shaped septum, RV-RA gradient 25 mmHg and severe TR (does not coapt) - NYHA III-IV on admit - Co-ox 61% (w low hgb) on 0.125 milrinone + 1 NE. Wean NE as able, target MAP > 65 or SBP > 90. Would wean off Milrinone, however concerned about CVP increase with diuresis.  - CVP 16-17, up from 12 yesterday. Net negative 2L (3.7L UOP). Re-dose with 80 mg lasix IV + 2.5 mg metolazone.   2. AKI/hypokalemia - in the setting of RV congestion - Scr down from yesterday with diuresis. Cr 1.23 today. Baseline < 1.  - Needs further diuresis   3. PAF - She does not do well in AFL/AF. - Recent admit 2/25 with AFL, converted to NSR on amio, but then stopped d/t long QT - Warfarin has been restarted, INR 2.0. Continue heparin gtt. - NSR on telemetry. Continue IV amiodarone while on milrinone.    4. CAD  Elevated Troponin - 1 V CAD with occluded mid RCA on cath (7/22) - hsTrop 113>121>198. Likely demand ischemia, decongest and follow - No s/s angina. - on heparin - continue statin  5. GI bleed Anemia  - hypotension with melena requiring RBCs - EGD 3/04 with gastritis, no other significant findings - hgb slowly trending down 7.9>7.6 today.    6. Hx of mechanical mitral valve replacement: - Mechanical MVR in [place] in 2001. - Stable on echo 2/25 MV gradient 3 - Continue Warfarin and heparin. PharmD dosing warfarin.  - Continue SBE prophylaxis.   7. Tricuspid valve regurgitation - severe on echo 2/25 - Follow    8. Right hand/wrist pain Hx gout - Takes prednisone and NSAIDs at home - Avoid NSAIDs - Suspect acute flare. Give 40 mg prednisone X 3 days.  - uric acid 7.7  9. Hyponatremia - Hypervolemic hyponatremia - Na 132>128>129, diurese again today  10. ID - Ceftriaxone and Flagyl switched to Zosyn 3/05 per CCM - ? Aspiration PNA. Repeat CXR - PCT 5.14>4.88>3.0 - BC X 2 - NGTD. UA negative. - WBCs trending back up 20>24. Source still unidentified. In the setting of  prednisone.  11. Hypokalemia - K 3.7 - Supp with diuresis  Length of Stay: 9  CRITICAL CARE Performed by: [place] Devann Cribb  Total critical care time: 18 minutes  Critical care time was exclusive of separately billable procedures and treating other patients.  Critical care was necessary to treat or prevent imminent or life-threatening deterioration.  Critical care was time spent personally by me on  the following activities: development of treatment plan with patient and/or surrogate as well as nursing, discussions with consultants, evaluation of patient's response to treatment, examination of patient, obtaining history from patient or surrogate, ordering and performing treatments and interventions, ordering and review of laboratory studies, ordering and review of radiographic studies, pulse oximetry and re-evaluation of patient's condition.  [place] Seeley Hissong, NP 7:10 AM

## 2023-08-28 ENCOUNTER — Other Ambulatory Visit: Payer: Self-pay | Admitting: Student

## 2023-08-28 DIAGNOSIS — R570 Cardiogenic shock: Secondary | ICD-10-CM | POA: Diagnosis not present

## 2023-08-28 DIAGNOSIS — K922 Gastrointestinal hemorrhage, unspecified: Secondary | ICD-10-CM | POA: Diagnosis not present

## 2023-08-28 DIAGNOSIS — A419 Sepsis, unspecified organism: Secondary | ICD-10-CM | POA: Diagnosis not present

## 2023-08-28 DIAGNOSIS — I5042 Chronic combined systolic (congestive) and diastolic (congestive) heart failure: Secondary | ICD-10-CM

## 2023-08-28 DIAGNOSIS — D62 Acute posthemorrhagic anemia: Secondary | ICD-10-CM | POA: Diagnosis not present

## 2023-08-28 LAB — BASIC METABOLIC PANEL
Anion gap: 13 (ref 5–15)
BUN: 23 mg/dL (ref 8–23)
CO2: 23 mmol/L (ref 22–32)
Calcium: 8.4 mg/dL — ABNORMAL LOW (ref 8.9–10.3)
Chloride: 92 mmol/L — ABNORMAL LOW (ref 98–111)
Creatinine, Ser: 1.32 mg/dL — ABNORMAL HIGH (ref 0.44–1.00)
GFR, Estimated: 43 mL/min — ABNORMAL LOW (ref >60)
Glucose, Bld: 80 mg/dL (ref 70–99)
Potassium: 4.1 mmol/L (ref 3.5–5.1)
Sodium: 128 mmol/L — ABNORMAL LOW (ref 135–145)

## 2023-08-28 LAB — COOXEMETRY PANEL
Carboxyhemoglobin: 1.7 % — ABNORMAL HIGH (ref 0.5–1.5)
Carboxyhemoglobin: 1.5 % (ref 0.5–1.5)
Methemoglobin: <0.7 % (ref 0.0–1.5)
Methemoglobin: <0.7 % (ref 0.0–1.5)
O2 Saturation: 47.8 %
O2 Saturation: 40.7 %
Total hemoglobin: 8.3 g/dL — ABNORMAL LOW (ref 12.0–16.0)
Total hemoglobin: 8.9 g/dL — ABNORMAL LOW (ref 12.0–16.0)

## 2023-08-28 LAB — CBC
HCT: 24 % — ABNORMAL LOW (ref 36.0–46.0)
Hemoglobin: 7.9 g/dL — ABNORMAL LOW (ref 12.0–15.0)
MCH: 27.5 pg (ref 26.0–34.0)
MCHC: 32.9 g/dL (ref 30.0–36.0)
MCV: 83.6 fL (ref 80.0–100.0)
Platelets: 491 10*3/uL — ABNORMAL HIGH (ref 150–400)
RBC: 2.87 MIL/uL — ABNORMAL LOW (ref 3.87–5.11)
RDW: 18.7 % — ABNORMAL HIGH (ref 11.5–15.5)
WBC: 18.5 10*3/uL — ABNORMAL HIGH (ref 4.0–10.5)
nRBC: 0 % (ref 0.0–0.2)

## 2023-08-28 LAB — BRAIN NATRIURETIC PEPTIDE: B Natriuretic Peptide: 589 pg/mL — ABNORMAL HIGH (ref 0.0–100.0)

## 2023-08-28 LAB — GLUCOSE, CAPILLARY
Glucose-Capillary: 105 mg/dL — ABNORMAL HIGH (ref 70–99)
Glucose-Capillary: 108 mg/dL — ABNORMAL HIGH (ref 70–99)
Glucose-Capillary: 108 mg/dL — ABNORMAL HIGH (ref 70–99)
Glucose-Capillary: 118 mg/dL — ABNORMAL HIGH (ref 70–99)
Glucose-Capillary: 156 mg/dL — ABNORMAL HIGH (ref 70–99)
Glucose-Capillary: 70 mg/dL (ref 70–99)
Glucose-Capillary: 89 mg/dL (ref 70–99)

## 2023-08-28 LAB — HEPARIN LEVEL (UNFRACTIONATED)
Heparin Unfractionated: 0.83 [IU]/mL — ABNORMAL HIGH (ref 0.30–0.70)
Heparin Unfractionated: 1 [IU]/mL — ABNORMAL HIGH (ref 0.30–0.70)

## 2023-08-28 LAB — PROTIME-INR
INR: 2.2 — ABNORMAL HIGH (ref 0.8–1.2)
Prothrombin Time: 24.4 s — ABNORMAL HIGH (ref 11.4–15.2)

## 2023-08-28 LAB — PROCALCITONIN: Procalcitonin: 1.84 ng/mL

## 2023-08-28 MED ORDER — HYDROMORPHONE HCL 1 MG/ML IJ SOLN
0.5000 mg | INTRAMUSCULAR | Status: AC | PRN
  Administered 2023-08-28 – 2023-08-29 (×6): 0.5 mg via INTRAVENOUS
  Filled 2023-08-28 (×6): qty 0.5

## 2023-08-28 MED ORDER — WARFARIN SODIUM 2.5 MG PO TABS
2.5000 mg | ORAL_TABLET | Freq: Once | ORAL | Status: AC
  Administered 2023-08-28: 2.5 mg via ORAL
  Filled 2023-08-28: qty 1

## 2023-08-28 MED ORDER — GABAPENTIN 100 MG PO CAPS
100.0000 mg | ORAL_CAPSULE | Freq: Three times a day (TID) | ORAL | Status: DC
  Administered 2023-08-28 – 2023-09-07 (×30): 100 mg via ORAL
  Filled 2023-08-28 (×30): qty 1

## 2023-08-28 NOTE — Progress Notes (Signed)
 PHARMACY - ANTICOAGULATION CONSULT NOTE  Pharmacy Consult for heparin > warfarin  Indication:  Mechanical mitral valve replacement  Allergies  Allergen Reactions   Lisinopril Swelling   Acetaminophen-Codeine Itching   Propoxyphene Itching    Darvocet   Tape Rash    Plastic   Tramadol Itching, Nausea And Vomiting and Rash    Patient Measurements: Height: 5\' 5"  (165.1 cm) Weight: 67.9 kg (149 lb 11.1 oz) IBW/kg (Calculated) : 57 Heparin Dosing Weight: TBW  Vital Signs: Temp: 97.5 F (36.4 C) (03/08 1130) Temp Source: Oral (03/08 1130) BP: 139/77 (03/08 1400) Pulse Rate: 80 (03/08 1045)  Labs: Recent Labs     0000 08/26/23 0404 08/26/23 0936 08/26/23 1417 08/27/23 0417 08/27/23 1410 08/28/23 0414 08/28/23 0727  HGB   < >  --  7.9*  --  7.6*  --  7.9*  --   HCT  --   --  24.2*  --  22.8*  --  24.0*  --   PLT  --   --  466*  --  439*  --  491*  --   LABPROT  --  18.7*  --   --  22.9*  --  24.4*  --   INR  --  1.5*  --   --  2.0*  --  2.2*  --   HEPARINUNFRC  --  0.16*  --    < > 0.57  --  0.83* 1.00*  CREATININE  --  1.32*  --    < > 1.23* 1.39* 1.32*  --    < > = values in this interval not displayed.    Estimated Creatinine Clearance: 34.2 mL/min (A) (by C-G formula based on SCr of 1.32 mg/dL (H)).   Medical History: Past Medical History:  Diagnosis Date   Arthritis    Asthma    Breast cancer (HCC) 2001   Left Breast Cancer   CHF (congestive heart failure) (HCC)    COPD (chronic obstructive pulmonary disease) (HCC)    Coronary artery disease    GERD (gastroesophageal reflux disease)    History of mitral valve replacement with mechanical valve    HLD (hyperlipidemia)    Hypertension    Pulmonary embolism (HCC) 2021     Assessment: 87 yoM with hx AFib and mMVR on warfarin (goal INR 2.5-3.5). Warfarin held on admit with melenic stools, heparin started for anticoagulation. EGD 3/4 with no bleeding, heparin and warfarin resumed postop.   heparin  level supratherapeutic on heparin drip 2400 uts/hr - running through PICC line and heparin leve drawn from peripheral stick, CBC stable.  INR today is up to 2.2 Will need to be cautious with warfarin dosing given recent shock, GIB, and new amio.  Home dose = 5mg  daily except 7.5mg  Mon/Thurs (5mg  daily per pt report)  Goal of Therapy:  Heparin level 0.3-0.7 INR goal 2.5-3.5 Monitor platelets by anticoagulation protocol: Yes  Plan:  Decrease heparin 2000 uts/hr  Warfarin 2.5mg  x1 tonight Daily INR, heparin level, CBC    Leota Sauers Pharm.D. CPP, BCPS Clinical Pharmacist [phone] 08/28/2023 3:04 PM   Please check AMION for all [facility] Pharmacy numbers 08/28/2023

## 2023-08-28 NOTE — Progress Notes (Signed)
 NAME:  MARITZA HOSTERMAN, [name]:  [id number], DOB:  [birth year], LOS: 10 ADMISSION DATE:  08/17/2023, CONSULTATION DATE:  08/18/23 REFERRING MD:  Dr. Lily Kocher, CHIEF COMPLAINT:  GI bleed   History of Present Illness:  73 year old female patient with multiple comorbidities as below, most prominently with mechanical mitral valve on chronic Coumadin therapy, A-fib present with lower GI bleed, bloody BMs, dizziness and weakness.   Of note patient has had previous episodes of melena, had colonoscopy in 2019 showing 20 mm polyp in internal hemorrhoids. Patient admits to recent NSAID use for past few weeks BID, has chronic pain, multiple complaints, critical care asked to see this patient due to symptomatic anemia and concern for worsening GI bleed.   Case is discussed with the hospitalist over the phone, is concerned about her IV access she currently has 2 peripheral IVs, we can continue to use this for blood products and IV fluids.  RN notified bedside by myself that if patient's IV access worsens and we will place a central line bedside.  Otherwise, patient can be admitted to the ICU for closer observation.  Pertinent  Medical History   has a past medical history of Arthritis, Asthma, Breast cancer (HCC) (2001), CHF (congestive heart failure) (HCC), COPD (chronic obstructive pulmonary disease) (HCC), Coronary artery disease, GERD (gastroesophageal reflux disease), History of mitral valve replacement with mechanical valve, HLD (hyperlipidemia), Hypertension, and Pulmonary embolism (HCC) (2021).   Significant Hospital Events: Including procedures, antibiotic start and stop dates in addition to other pertinent events   2/25 admitted to IMTS for hypotension and melena likely acute GI bleed, transfer to ICU due to limited IV access and close monitoring s/p 1u PRBC, GI consulted 2/26 PICC placed  2/27 cardiology consulted, coox 59% and CVP elevated 3/3 EGD planned tomorrow on 3/4  3/4 On levophed, milrinone, amio,  and heparin.  EGD completed: Diffuse granular mucosa found at Entire stomach localized mild inflammation in the gastric antrum no ulceration in the stomach the duodenum was normal impression was gastritis.  Started on Flagyl and ceftriaxone, ? aspiration 3/5: Delirium persists.  Changed ceftriaxone and Flagyl to Zosyn 3/6 no distress more awake and alert reporting some right hand swelling and pain being treated for gout flare   Interim History / Subjective:  No events. Pressor needs resolved. Milrinone being adjusted by AHF.  Objective   Blood pressure 139/77, pulse 80, temperature (!) 97.5 F (36.4 C), temperature source Oral, resp. rate 20, height 5\' 5"  (1.651 m), weight 67.9 kg, SpO2 95%. CVP:  [10 mmHg-25 mmHg] 10 mmHg      Intake/Output Summary (Last 24 hours) at 08/28/2023 1508 Last data filed at 08/28/2023 1200 Gross per 24 hour  Intake 1110.25 ml  Output 1675 ml  Net -564.75 ml   Filed Weights   08/23/23 0400 08/24/23 0827 08/28/23 0600  Weight: 73 kg 73 kg 67.9 kg    Examination:  No distress Moves to command Aox3 Ext warm Abd soft Minimal edema  SvO2 40% on 0.125 milrinone Cr stable Stable hyponatremia WBC improved Plts stable elevated Stable anemia  Resolved Hospital Problem list   Lactic acidosis Anion gap metabolic acidosis Shock liver  Assessment & Plan:  Acute upper GI bleeding secondary to gastritis per EGD on 3/2 with associated Acute blood loss anemia on chronic anemia  Received 1 unit PRBCs during this admission, CBC has been fairly stable since admission Plan Daily PPI per GI Trend CBC, stable  Acute on chronic RV failure with cardiogenic shock,  complicated by septic shock HFimpEF (EF 55%) Pulmonary hypertension Advance HF team following, appreciate assistance, Co. oximetry remains in the 60s.  Vasoactive drip requirements worse, leukocytosis rising Plan Per AHF  AKI on CKD 3a due to cardiorenal syndrome with hypervolemic hyponatremia   Serum creatinine stable, continues to improve Plan Per AHF  Demand cardiac ischemia Prolonged QT Paroxysmal A-fib status post cardioversion 9/23 Mechanical mitral valve replacement on Coumadin EKG showed no ST or T wave changes No chest pain Plan - Warfarin per AHF    COPD, not in exacerbation plan - Continue inhalers: continue breo ellipta, and incruse; steroids off   Delirium - at bedtime seroquel, re-orient PRN  Chronic back pain Postherpetic neuralgia Chronic opiate use Anxiety disorder Gout with flare on 3/6- improved with steroids Patient was taking BID NSAID for past few weeks  plan Hold NSAIDs indefinitely Continue current pain regiment  Resume PTA gabapentin lower dose delirium precautions  PT/OT help appreciated Up to chair daily   Prediabetes A1c is 6 Blood sugars are better controlled Plan Continue SSI, CBG goal 140 to 180   Stable for PCU, appreciate TRH taking over starting 08/28/23 Remaining issues: PT/OT eval, GDMT (AHF), warfarin tolerance, milrinone wean, continued improvement in delirium  Myrla Halsted MD PCCM

## 2023-08-28 NOTE — Progress Notes (Signed)
 Advanced Heart Failure Rounding Note  Cardiologist: Arvilla Meres, MD   Chief Complaint: RV Failure  Subjective:   - No complaints; no acute events overnight. Doing very well today. Sitting up at bedside.  - Mixed venous 47% - Procal 1.84 Objective:    Weight Range: 67.9 kg Body mass index is 24.91 kg/m.   Vital Signs:   Temp:  [97.3 F (36.3 C)-97.5 F (36.4 C)] 97.5 F (36.4 C) (03/08 1130) Pulse Rate:  [74-85] 80 (03/08 1045) Resp:  [13-29] 20 (03/08 0815) BP: (94-141)/(52-112) 129/79 (03/08 1200) SpO2:  [88 %-98 %] 95 % (03/08 1130) Weight:  [67.9 kg] 67.9 kg (03/08 0600) Last BM Date : 08/27/23  Weight change: Filed Weights   08/23/23 0400 08/24/23 0827 08/28/23 0600  Weight: 73 kg 73 kg 67.9 kg   Intake/Output:   Intake/Output Summary (Last 24 hours) at 08/28/2023 1211 Last data filed at 08/28/2023 1200 Gross per 24 hour  Intake 1614.58 ml  Output 1675 ml  Net -60.42 ml    CVP 10-13 with large V waves  Physical Exam    General: awake/alert, sitting at bedside chair.  Cardiac: JVP 9 with large V waves Resp: Lung sounds clear and equal B/L Extremities: Warm and dry. Trace peripheral edema. R hand swelling resolved Neuro: Alert and oriented x3. Affect pleasant. Generalized weakness.  Telemetry   SR 80s.   EKG    N/A  Labs    CBC Recent Labs    08/27/23 0417 08/28/23 0414  WBC 23.6* 18.5*  HGB 7.6* 7.9*  HCT 22.8* 24.0*  MCV 84.8 83.6  PLT 439* 491*   Basic Metabolic Panel Recent Labs    16/10/96 1410 08/28/23 0414  NA 126* 128*  K 3.1* 4.1  CL 90* 92*  CO2 21* 23  GLUCOSE 216* 80  BUN 18 23  CREATININE 1.39* 1.32*  CALCIUM 7.9* 8.4*    BNP (last 3 results) Recent Labs    08/26/23 0404 08/27/23 0458 08/28/23 0414  BNP 375.3* 598.1* 589.0*   Other results:  Medications:    Scheduled Medications:  Chlorhexidine Gluconate Cloth  6 each Topical Daily   docusate sodium  100 mg Oral BID   feeding supplement  237  mL Oral BID BM   fluticasone furoate-vilanterol  1 puff Inhalation Daily   And   umeclidinium bromide  1 puff Inhalation Daily   insulin aspart  0-15 Units Subcutaneous Q4H   pantoprazole  40 mg Oral Daily   polyethylene glycol  17 g Oral BID   QUEtiapine  25 mg Oral QHS   sennosides  5 mL Oral BID   warfarin  2.5 mg Oral ONCE-1600   Warfarin - Pharmacist Dosing Inpatient   Does not apply q1600    Infusions:  sodium chloride Stopped (08/20/23 1159)   amiodarone 30 mg/hr (08/28/23 1200)   heparin 2,000 Units/hr (08/28/23 1200)   milrinone 0.125 mcg/kg/min (08/28/23 1200)   norepinephrine (LEVOPHED) Adult infusion Stopped (08/28/23 0800)   piperacillin-tazobactam (ZOSYN)  IV 12.5 mL/hr at 08/28/23 1200    PRN Medications: sodium chloride, acetaminophen, HYDROmorphone (DILAUDID) injection, ondansetron (ZOFRAN) IV, mouth rinse  Patient Profile   Lakesa Coste is a 73 y.o. female with a history of mechanical MVR in 2001 in [place], [place], CAD, hx breast cancer s/p left mastectomy and radiation/chemo, tobacco use, COPD, prior PE/DVT, HTN, undergoing workup for possible inflammatory arthritis with Rheumatology, and systolic CHF.   Assessment/Plan   1. Acute on chronic RV  Failure HFimpEF Pulmonary HTN  - Suspect likely underlying OSA contributing to PH/RV failure. Also w/ known COPD. - Echo this admit EF 55% and D-shaped septum, RV-RA gradient 25 mmHg and severe TR (does not coapt) - NYHA III-IV on admit - Mixed venous of 48 with hgb 7.9; repeated mixed venous with coox of 40%. Increase milrinone to 0.110mcg/kg/min - Transfer to floor.    2. AKI/hypokalemia - in the setting of RV congestion - sCr of 1.32 today - Hold diuretics today.    3. PAF - She does not do well in AFL/AF. - Recent admit 2/25 with AFL, converted to NSR on amio, but then stopped d/t long QT - Warfarin has been restarted, INR 2.0. Continue heparin gtt. - NSR on telemetry. Continue IV amiodarone while on  milrinone.    4. CAD  Elevated Troponin - 1 V CAD with occluded mid RCA on cath (7/22) - hsTrop 113>121>198. Likely demand ischemia, decongest and follow - No s/s angina. - on heparin - continue statin  5. GI bleed Anemia  - hypotension with melena requiring RBCs - EGD 3/04 with gastritis, no other significant findings - Hgb stable.     6. Hx of mechanical mitral valve replacement: - Mechanical MVR in [place] in 2001. - Stable on echo 2/25 MV gradient 3 - Continue Warfarin and heparin. PharmD dosing warfarin.  - Continue SBE prophylaxis.   7. Tricuspid valve regurgitation - severe on echo 2/25 - Follow    8. Right hand/wrist pain Hx gout - Takes prednisone and NSAIDs at home - Avoid NSAIDs - Suspect acute flare. Give 40 mg prednisone X 3 days.  - uric acid 7.7  9. Hyponatremia - Hypervolemic hyponatremia - NA 128; stable.   10. ID - Ceftriaxone and Flagyl switched to Zosyn 3/05 per CCM - ? Aspiration PNA. Repeat CXR - WBC ct improving.   11. Hypokalemia -discussed with pharmD  Length of Stay: 10  CRITICAL CARE Performed by: Dorthula Nettles   Total critical care time: 35 minutes  Critical care time was exclusive of separately billable procedures and treating other patients.  Critical care was necessary to treat or prevent imminent or life-threatening deterioration.  Critical care was time spent personally by me on the following activities: development of treatment plan with patient and/or surrogate as well as nursing, discussions with consultants, evaluation of patient's response to treatment, examination of patient, obtaining history from patient or surrogate, ordering and performing treatments and interventions, ordering and review of laboratory studies, ordering and review of radiographic studies, pulse oximetry and re-evaluation of patient's condition.   Kohner Orlick, DO 12:11 PM

## 2023-08-28 NOTE — Progress Notes (Deleted)
 08/28/2023 Off pressors, to PCU with AHF primary. Available PRN.  Myrla Halsted MD PCCM

## 2023-08-29 ENCOUNTER — Inpatient Hospital Stay (HOSPITAL_COMMUNITY)

## 2023-08-29 DIAGNOSIS — K922 Gastrointestinal hemorrhage, unspecified: Secondary | ICD-10-CM | POA: Diagnosis not present

## 2023-08-29 LAB — CBC
HCT: 26 % — ABNORMAL LOW (ref 36.0–46.0)
Hemoglobin: 8.5 g/dL — ABNORMAL LOW (ref 12.0–15.0)
MCH: 27.9 pg (ref 26.0–34.0)
MCHC: 32.7 g/dL (ref 30.0–36.0)
MCV: 85.2 fL (ref 80.0–100.0)
Platelets: 569 10*3/uL — ABNORMAL HIGH (ref 150–400)
RBC: 3.05 MIL/uL — ABNORMAL LOW (ref 3.87–5.11)
RDW: 19 % — ABNORMAL HIGH (ref 11.5–15.5)
WBC: 15.2 10*3/uL — ABNORMAL HIGH (ref 4.0–10.5)
nRBC: 0.3 % — ABNORMAL HIGH (ref 0.0–0.2)

## 2023-08-29 LAB — PROTIME-INR
INR: 2.3 — ABNORMAL HIGH (ref 0.8–1.2)
Prothrombin Time: 25.4 s — ABNORMAL HIGH (ref 11.4–15.2)

## 2023-08-29 LAB — GLUCOSE, CAPILLARY
Glucose-Capillary: 119 mg/dL — ABNORMAL HIGH (ref 70–99)
Glucose-Capillary: 126 mg/dL — ABNORMAL HIGH (ref 70–99)
Glucose-Capillary: 147 mg/dL — ABNORMAL HIGH (ref 70–99)
Glucose-Capillary: 76 mg/dL (ref 70–99)
Glucose-Capillary: 85 mg/dL (ref 70–99)
Glucose-Capillary: 91 mg/dL (ref 70–99)
Glucose-Capillary: 92 mg/dL (ref 70–99)

## 2023-08-29 LAB — HEPARIN LEVEL (UNFRACTIONATED): Heparin Unfractionated: >1.1 [IU]/mL — ABNORMAL HIGH (ref 0.30–0.70)

## 2023-08-29 LAB — BASIC METABOLIC PANEL
Anion gap: 14 (ref 5–15)
BUN: 27 mg/dL — ABNORMAL HIGH (ref 8–23)
CO2: 23 mmol/L (ref 22–32)
Calcium: 8.6 mg/dL — ABNORMAL LOW (ref 8.9–10.3)
Chloride: 90 mmol/L — ABNORMAL LOW (ref 98–111)
Creatinine, Ser: 1.45 mg/dL — ABNORMAL HIGH (ref 0.44–1.00)
GFR, Estimated: 38 mL/min — ABNORMAL LOW (ref >60)
Glucose, Bld: 96 mg/dL (ref 70–99)
Potassium: 3.4 mmol/L — ABNORMAL LOW (ref 3.5–5.1)
Sodium: 127 mmol/L — ABNORMAL LOW (ref 135–145)

## 2023-08-29 LAB — COOXEMETRY PANEL
Carboxyhemoglobin: 2.4 % — ABNORMAL HIGH (ref 0.5–1.5)
Methemoglobin: <0.7 % (ref 0.0–1.5)
O2 Saturation: 69 %
Total hemoglobin: 8.5 g/dL — ABNORMAL LOW (ref 12.0–16.0)

## 2023-08-29 MED ORDER — WARFARIN SODIUM 5 MG PO TABS
5.0000 mg | ORAL_TABLET | Freq: Once | ORAL | Status: AC
  Administered 2023-08-29: 5 mg via ORAL
  Filled 2023-08-29: qty 1

## 2023-08-29 MED ORDER — OXYCODONE HCL 5 MG PO TABS
5.0000 mg | ORAL_TABLET | Freq: Once | ORAL | Status: AC
  Administered 2023-08-29: 5 mg via ORAL
  Filled 2023-08-29: qty 1

## 2023-08-29 MED ORDER — ENOXAPARIN SODIUM 60 MG/0.6ML IJ SOSY
60.0000 mg | PREFILLED_SYRINGE | Freq: Once | INTRAMUSCULAR | Status: AC
  Administered 2023-08-29: 60 mg via SUBCUTANEOUS
  Filled 2023-08-29: qty 0.6

## 2023-08-29 MED ORDER — COLCHICINE 0.6 MG PO TABS
0.6000 mg | ORAL_TABLET | Freq: Every day | ORAL | Status: DC
  Administered 2023-08-29 – 2023-09-02 (×5): 0.6 mg via ORAL
  Filled 2023-08-29 (×5): qty 1

## 2023-08-29 MED ORDER — OXYCODONE HCL 5 MG PO TABS
5.0000 mg | ORAL_TABLET | Freq: Four times a day (QID) | ORAL | Status: DC | PRN
  Administered 2023-08-29 – 2023-09-07 (×32): 5 mg via ORAL
  Filled 2023-08-29 (×32): qty 1

## 2023-08-29 NOTE — TOC Progression Note (Signed)
 Transition of Care [facility]) - Progression Note    Patient Details  Name: Kim Anthony [name]: [id number] Date of Birth: 1950-09-14  [facility] of [facility] CM/SW Contact  Inis Sizer, LCSW Phone Number: 08/29/2023, 8:39 AM  Clinical Narrative:    CSW spoke with patient's daughter [name] to present her with bed offers - Monica requested bed offers be sent to her via text which was done. Monica to review and will notify CSW with decision so that insurance authorization can be started.     Barriers to Discharge: Continued Medical Work up  Expected Discharge Plan and Services       Living arrangements for the past 2 months: Single Family Home                                       Social Determinants of Health (SDOH) Interventions SDOH Screenings   Food Insecurity: No Food Insecurity (08/18/2023)  Housing: Low Risk  (08/18/2023)  Transportation Needs: No Transportation Needs (08/18/2023)  Utilities: Not At Risk (08/18/2023)  Alcohol Screen: Low Risk  (12/01/2022)  Depression (PHQ2-9): Low Risk  (12/01/2022)  Financial Resource Strain: Low Risk  (12/01/2022)  Physical Activity: Inactive (12/01/2022)  Social Connections: Patient Declined (08/18/2023)  Stress: Stress Concern Present (12/01/2022)  Tobacco Use: High Risk (08/24/2023)    Readmission Risk Interventions    02/19/2022    3:36 PM  Readmission Risk Prevention Plan  Transportation Screening Complete  PCP or Specialist Appt within 3-5 Days Complete  HRI or Home Care Consult Complete  Social Work Consult for Recovery Care Planning/Counseling Complete  Palliative Care Screening Not Applicable  Medication Review Oceanographer) Complete

## 2023-08-29 NOTE — Progress Notes (Signed)
 Advanced Heart Failure Rounding Note  Cardiologist: Arvilla Meres, MD   Chief Complaint: RV Failure  Subjective:   - Doing well today; only complaints are right wrist pain from gout.  - mixed venous 69% on milrinone 0.2mcg/kg/min Objective:    Weight Range: 67.9 kg Body mass index is 24.91 kg/m.   Vital Signs:   Temp:  [97.6 F (36.4 C)-98.1 F (36.7 C)] 97.9 F (36.6 C) (03/09 0916) Pulse Rate:  [79-83] 79 (03/09 0923) Resp:  [14-25] 21 (03/09 0923) BP: (102-160)/(66-90) 103/71 (03/09 0916) SpO2:  [94 %-99 %] 96 % (03/09 0916) Last BM Date : 08/28/23  Weight change: Filed Weights   08/23/23 0400 08/24/23 0827 08/28/23 0600  Weight: 73 kg 73 kg 67.9 kg   Intake/Output:   Intake/Output Summary (Last 24 hours) at 08/29/2023 1151 Last data filed at 08/29/2023 0919 Gross per 24 hour  Intake 53.62 ml  Output 1000 ml  Net -946.38 ml    CVP 10-12  Physical Exam    General: awake/alert, sitting at bedside chair.  Cardiac: JVP 10 with large V waves Resp: Lung sounds clear and equal B/L Extremities: Warm and dry. Trace peripheral edema. R hand swelling resolved Neuro: Alert and oriented x3. Affect pleasant. Generalized weakness.  Telemetry   SR 80s.   EKG    N/A  Labs    CBC Recent Labs    08/27/23 0417 08/28/23 0414  WBC 23.6* 18.5*  HGB 7.6* 7.9*  HCT 22.8* 24.0*  MCV 84.8 83.6  PLT 439* 491*   Basic Metabolic Panel Recent Labs    40/98/11 1410 08/28/23 0414  NA 126* 128*  K 3.1* 4.1  CL 90* 92*  CO2 21* 23  GLUCOSE 216* 80  BUN 18 23  CREATININE 1.39* 1.32*  CALCIUM 7.9* 8.4*    BNP (last 3 results) Recent Labs    08/26/23 0404 08/27/23 0458 08/28/23 0414  BNP 375.3* 598.1* 589.0*   Other results:  Medications:    Scheduled Medications:  Chlorhexidine Gluconate Cloth  6 each Topical Daily   colchicine  0.6 mg Oral Daily   docusate sodium  100 mg Oral BID   feeding supplement  237 mL Oral BID BM   fluticasone  furoate-vilanterol  1 puff Inhalation Daily   And   umeclidinium bromide  1 puff Inhalation Daily   gabapentin  100 mg Oral TID   insulin aspart  0-15 Units Subcutaneous Q4H   oxyCODONE  5 mg Oral Once   pantoprazole  40 mg Oral Daily   polyethylene glycol  17 g Oral BID   QUEtiapine  25 mg Oral QHS   sennosides  5 mL Oral BID   Warfarin - Pharmacist Dosing Inpatient   Does not apply q1600    Infusions:  sodium chloride Stopped (08/20/23 1159)   amiodarone 30 mg/hr (08/29/23 0447)   heparin 2,000 Units/hr (08/29/23 0517)   milrinone 0.25 mcg/kg/min (08/28/23 1539)   piperacillin-tazobactam (ZOSYN)  IV 3.375 g (08/29/23 1030)    PRN Medications: sodium chloride, acetaminophen, ondansetron (ZOFRAN) IV, mouth rinse  Patient Profile   Mirren Gest is a 73 y.o. female with a history of mechanical MVR in 2001 in [place], [place], CAD, hx breast cancer s/p left mastectomy and radiation/chemo, tobacco use, COPD, prior PE/DVT, HTN, undergoing workup for possible inflammatory arthritis with Rheumatology, and systolic CHF.   Assessment/Plan   1. Acute on chronic RV Failure HFimpEF Pulmonary HTN  - Suspect likely underlying OSA contributing to  PH/RV failure. Also w/ known COPD. - Echo this admit EF 55% and D-shaped septum, RV-RA gradient 25 mmHg and severe TR (does not coapt) - NYHA III-IV on admit - Mixed venous 69%; wean milrinone to 0.198mcg/kg/min; plan to transition to PO amiodarone once off milrinone.    2. AKI/hypokalemia - in the setting of RV congestion - labs pending; restart diuretics tomorrow.    3. PAF - She does not do well in AFL/AF. - Recent admit 2/25 with AFL, converted to NSR on amio, but then stopped d/t long QT - Warfarin has been restarted, INR 2.0. Continue heparin gtt. - NSR on telemetry. Continue IV amiodarone while on milrinone.    4. CAD  Elevated Troponin - 1 V CAD with occluded mid RCA on cath (7/22) - hsTrop 113>121>198. Likely demand ischemia,  decongest and follow - No s/s angina. - on heparin - continue statin  5. GI bleed Anemia  - hypotension with melena requiring RBCs - EGD 3/04 with gastritis, no other significant findings - Hgb stable.     6. Hx of mechanical mitral valve replacement: - Mechanical MVR in [place] in 2001. - Stable on echo 2/25 MV gradient 3 - Continue Warfarin and heparin. PharmD dosing warfarin.  - Continue SBE prophylaxis.   7. Tricuspid valve regurgitation - severe on echo 2/25 - Follow    8. Right hand/wrist pain Hx gout - Takes prednisone and NSAIDs at home - Avoid NSAIDs - Suspect acute flare. Completed x3 days of steroids.  - continuing to have mild pain although improving; colchicine 0.6mg  daily  - if pain continues plan on CT vs MRI to rule out osteomyelitis or abscess.  - uric acid 7.7  9. Hyponatremia - Hypervolemic hyponatremia - las pending  10. ID - Ceftriaxone and Flagyl switched to Zosyn 3/05 per CCM - ? Aspiration PNA. Repeat CXR -  labs pending  11. Hypokalemia -discussed with pharmD  Length of Stay: 11  Mcadoo Muzquiz, DO 11:51 AM

## 2023-08-29 NOTE — Progress Notes (Signed)
 PHARMACY - ANTICOAGULATION CONSULT NOTE  Pharmacy Consult for heparin> enoxaparin  > warfarin  Indication:  Mechanical mitral valve replacement  Allergies  Allergen Reactions   Lisinopril Swelling   Acetaminophen-Codeine Itching   Propoxyphene Itching    Darvocet   Tape Rash    Plastic   Tramadol Itching, Nausea And Vomiting and Rash    Patient Measurements: Height: 5\' 5"  (165.1 cm) Weight: 67.9 kg (149 lb 11.1 oz) IBW/kg (Calculated) : 57 Heparin Dosing Weight: TBW  Vital Signs: Temp: 97.9 F (36.6 C) (03/09 1200) Temp Source: Oral (03/09 1200) BP: 104/60 (03/09 1200) Pulse Rate: 77 (03/09 1200)  Labs: Recent Labs    08/27/23 0417 08/27/23 1410 08/28/23 0414 08/28/23 0727 08/29/23 1418  HGB 7.6*  --  7.9*  --  8.5*  HCT 22.8*  --  24.0*  --  26.0*  PLT 439*  --  491*  --  569*  LABPROT 22.9*  --  24.4*  --  25.4*  INR 2.0*  --  2.2*  --  2.3*  HEPARINUNFRC 0.57  --  0.83* 1.00* >1.10*  CREATININE 1.23* 1.39* 1.32*  --  1.45*    Estimated Creatinine Clearance: 31.1 mL/min (A) (by C-G formula based on SCr of 1.45 mg/dL (H)).   Medical History: Past Medical History:  Diagnosis Date   Arthritis    Asthma    Breast cancer (HCC) 2001   Left Breast Cancer   CHF (congestive heart failure) (HCC)    COPD (chronic obstructive pulmonary disease) (HCC)    Coronary artery disease    GERD (gastroesophageal reflux disease)    History of mitral valve replacement with mechanical valve    HLD (hyperlipidemia)    Hypertension    Pulmonary embolism (HCC) 2021     Assessment: 49 yoM with hx AFib and mMVR on warfarin (goal INR 2.5-3.5). Warfarin held on admit with melenic stools, heparin started for anticoagulation. EGD 3/4 with no bleeding, heparin and warfarin resumed postop.   heparin level supratherapeutic 1.1 on heparin drip 2000 uts/hr - running through PICC line and heparin level drawn from peripheral stick -unsure of accuracy  CBC stable.  Stop heparin and  give 1 dose enoxaparin 1mg /kg tonight > can stop when INR > 2.5  INR today is up to 2.3  Will need to be cautious with warfarin dosing given recent shock, GIB, and new amio.  Home dose = 5mg  daily except 7.5mg  Mon/Thurs (5mg  daily per pt report)  Goal of Therapy:   INR goal 2.5-3.5 Monitor platelets by anticoagulation protocol: Yes  Plan:  Stop heparin  Enoxaparin 60mg  x1 tonight  Warfarin 5mg  x1 tonight Daily INR, CBC    Leota Sauers Pharm.D. CPP, BCPS Clinical Pharmacist [phone] 08/29/2023 3:32 PM   Please check AMION for all [facility] Pharmacy numbers 08/29/2023

## 2023-08-29 NOTE — Progress Notes (Cosign Needed Addendum)
 HD#11 SUBJECTIVE:  Patient Summary: Kim Anthony is a 73 y.o. with a pertinent PMH of biventricular heart failure, MV replacement c/b valvular afib on coumadin, CAD, and gout who presented with hypotension and melena and admitted for suspected GI bleed.   Overnight Events: NAEO  Interim History: Patient was evaluated at bedside. No acute concerns. Denies any nausea, vomiting, fevers, chest pain. Continues to endorse productive cough and some troubles breathing but overall feels like she is improving.   OBJECTIVE:  Vital Signs: Vitals:   08/28/23 1532 08/28/23 2016 08/28/23 2300 08/29/23 0410  BP: (!) 113/90 111/78 128/81 112/66  Pulse:  83 79 80  Resp: 14 20 20  (!) 25  Temp: 97.9 F (36.6 C) 98.1 F (36.7 C) 97.6 F (36.4 C) 98 F (36.7 C)  TempSrc: Oral Oral Oral Oral  SpO2: 94% 94% 94% 99%  Weight:      Height:       Supplemental O2: Room Air SpO2: 99 % O2 Flow Rate (L/min): 2 L/min  Filed Weights   08/23/23 0400 08/24/23 0827 08/28/23 0600  Weight: 73 kg 73 kg 67.9 kg     Intake/Output Summary (Last 24 hours) at 08/29/2023 0814 Last data filed at 08/28/2023 1200 Gross per 24 hour  Intake 191.22 ml  Output 200 ml  Net -8.78 ml   Net IO Since Admission: -6,216.44 mL [08/29/23 0814]  Physical Exam: Physical Exam Constitutional:      Appearance: Normal appearance.  HENT:     Head: Normocephalic and atraumatic.  Cardiovascular:     Rate and Rhythm: Normal rate and regular rhythm.  Pulmonary:     Effort: Pulmonary effort is normal.     Breath sounds: Normal breath sounds.  Abdominal:     General: Abdomen is flat. Bowel sounds are normal.  Neurological:     General: No focal deficit present.     Mental Status: She is alert.    CBC    Component Value Date/Time   WBC 18.5 (H) 08/28/2023 0414   RBC 2.87 (L) 08/28/2023 0414   HGB 7.9 (L) 08/28/2023 0414   HGB 12.8 01/13/2022 1038   HCT 24.0 (L) 08/28/2023 0414   HCT 38.8 01/13/2022 1038   PLT 491 (H)  08/28/2023 0414   PLT 427 01/13/2022 1038   MCV 83.6 08/28/2023 0414   MCV 96 01/13/2022 1038   MCH 27.5 08/28/2023 0414   MCHC 32.9 08/28/2023 0414   RDW 18.7 (H) 08/28/2023 0414   RDW 11.4 (L) 01/13/2022 1038   LYMPHSABS 1.9 08/23/2023 0454   LYMPHSABS 3.0 04/21/2018 1624   MONOABS 2.7 (H) 08/23/2023 0454   EOSABS 0.4 08/23/2023 0454   EOSABS 0.2 04/21/2018 1624   BASOSABS 0.1 08/23/2023 0454   BASOSABS 0.1 04/21/2018 1624    ASSESSMENT/PLAN:  Assessment: Principal Problem:   Acute GI bleeding Active Problems:   Biventricular heart failure with reduced left ventricular function (HCC)   S/P mitral valve replacement   Hypotension   Warfarin anticoagulation   Acute renal failure (HCC)   Anemia   QT prolongation   ABLA (acute blood loss anemia)   Pulmonary hypertension, low resistance (HCC)   Plan: #Acute Upper GI Bleeding 2/2 to Gastritis per EGD  #Acute on Chronic Anemia  Transitioned from ICU to floor.  Hemoglobin stable at 8.5 on pulse ox.  Will continue daily PPI and monitor for signs/symptoms of bleeding. AM CBC pending so will follow-up.  - Continue Protonix 40 mg daily - F/U CBC  #  Acute on chronic RV failure with cardiogenic shock, complicated by septic shock #HFimpEF (EF 55%) #Pulmonary hypertension Stable and denies any signs/symptoms this morning. Followed by heart failure. Likely 2/2 to overlying OSA w/ known COPD. - Follow-up cardiology recs - Continue milrinone  #PAF Followed by heart failure. Remains in NSR on tele. Will continue IV amio while on milrinone. - Follow-up cardiology recs - Continue IV amiodarone  #Aspiration Pneumonia  Continues to endorse productive cough with mild shortness of breath.  No fever or leukocytosis. On day 4 of abx and will complete final dose tomorrow.  - Continue Zosyn  #AKI on CKD 3a 2/2 cardiorenal syndrome w/ hypervolemic hyponatremia Stable. AM BMP pending so will follow-up.  - F/U BMP  #Demand cardiac  ischemia #Prolonged QT #Paroxysmal A-fib status post cardioversion 9/23 #Mechanical mitral valve replacement on Coumadin Stable. Continues to deny any chest pain.  Will continue to monitor INR for transition from heparin to warfarin with goal INR 2.5-3.5. - F/U INR - Continue warfarin   #Delirium  Continue bedtime Seroquel and delirium precautions.  Chronic Conditions #COPD: Continue breo ellipta and incruse  #Chronic Back Pain: Continue to hold NSAIDs indefinitely #Postherpetic Neuralgia: Continue lower dose gabapentin  Best Practice: Diet: Cardiac diet IVF: Fluids: None, Rate: None VTE: Heparin  Code: Full AB: Zosyn Therapy Recs: SNF, DME: none DISPO: Anticipated discharge  TBD  to Skilled nursing facility pending  medication management, INR .  Signature: Morrie Sheldon, MD Internal Medicine Resident, PGY-1 [facility]  Pager: [phone]  Please contact the on call pager after 5 pm and on weekends at [phone].

## 2023-08-29 NOTE — Plan of Care (Signed)

## 2023-08-29 NOTE — Plan of Care (Signed)

## 2023-08-30 ENCOUNTER — Telehealth (HOSPITAL_COMMUNITY): Payer: Self-pay

## 2023-08-30 DIAGNOSIS — K922 Gastrointestinal hemorrhage, unspecified: Secondary | ICD-10-CM | POA: Diagnosis not present

## 2023-08-30 DIAGNOSIS — R410 Disorientation, unspecified: Secondary | ICD-10-CM

## 2023-08-30 DIAGNOSIS — J690 Pneumonitis due to inhalation of food and vomit: Secondary | ICD-10-CM

## 2023-08-30 HISTORY — DX: Disorientation, unspecified: R41.0

## 2023-08-30 LAB — BASIC METABOLIC PANEL
Anion gap: 15 (ref 5–15)
BUN: 27 mg/dL — ABNORMAL HIGH (ref 8–23)
CO2: 22 mmol/L (ref 22–32)
Calcium: 8.4 mg/dL — ABNORMAL LOW (ref 8.9–10.3)
Chloride: 92 mmol/L — ABNORMAL LOW (ref 98–111)
Creatinine, Ser: 1.46 mg/dL — ABNORMAL HIGH (ref 0.44–1.00)
GFR, Estimated: 38 mL/min — ABNORMAL LOW (ref >60)
Glucose, Bld: 126 mg/dL — ABNORMAL HIGH (ref 70–99)
Potassium: 3.1 mmol/L — ABNORMAL LOW (ref 3.5–5.1)
Sodium: 129 mmol/L — ABNORMAL LOW (ref 135–145)

## 2023-08-30 LAB — CULTURE, BLOOD (ROUTINE X 2)
Culture: NO GROWTH
Culture: NO GROWTH
Special Requests: ADEQUATE
Special Requests: ADEQUATE

## 2023-08-30 LAB — CBC
HCT: 25.2 % — ABNORMAL LOW (ref 36.0–46.0)
Hemoglobin: 8 g/dL — ABNORMAL LOW (ref 12.0–15.0)
MCH: 27.6 pg (ref 26.0–34.0)
MCHC: 31.7 g/dL (ref 30.0–36.0)
MCV: 86.9 fL (ref 80.0–100.0)
Platelets: 624 10*3/uL — ABNORMAL HIGH (ref 150–400)
RBC: 2.9 MIL/uL — ABNORMAL LOW (ref 3.87–5.11)
RDW: 19.5 % — ABNORMAL HIGH (ref 11.5–15.5)
WBC: 15.3 10*3/uL — ABNORMAL HIGH (ref 4.0–10.5)
nRBC: 0.3 % — ABNORMAL HIGH (ref 0.0–0.2)

## 2023-08-30 LAB — PROTIME-INR
INR: 2.7 — ABNORMAL HIGH (ref 0.8–1.2)
Prothrombin Time: 28.8 s — ABNORMAL HIGH (ref 11.4–15.2)

## 2023-08-30 LAB — COOXEMETRY PANEL
Carboxyhemoglobin: 1.7 % — ABNORMAL HIGH (ref 0.5–1.5)
Carboxyhemoglobin: 1.4 % (ref 0.5–1.5)
Methemoglobin: <0.7 % (ref 0.0–1.5)
Methemoglobin: 0.8 % (ref 0.0–1.5)
O2 Saturation: 53.6 %
O2 Saturation: 54.1 %
Total hemoglobin: 8.5 g/dL — ABNORMAL LOW (ref 12.0–16.0)
Total hemoglobin: 10.9 g/dL — ABNORMAL LOW (ref 12.0–16.0)

## 2023-08-30 LAB — GLUCOSE, CAPILLARY
Glucose-Capillary: 114 mg/dL — ABNORMAL HIGH (ref 70–99)
Glucose-Capillary: 148 mg/dL — ABNORMAL HIGH (ref 70–99)
Glucose-Capillary: 86 mg/dL (ref 70–99)
Glucose-Capillary: 126 mg/dL — ABNORMAL HIGH (ref 70–99)
Glucose-Capillary: 97 mg/dL (ref 70–99)

## 2023-08-30 MED ORDER — FUROSEMIDE 10 MG/ML IJ SOLN
80.0000 mg | Freq: Two times a day (BID) | INTRAMUSCULAR | Status: DC
  Administered 2023-08-30 – 2023-08-31 (×2): 80 mg via INTRAVENOUS
  Filled 2023-08-30 (×2): qty 8

## 2023-08-30 MED ORDER — MILRINONE LACTATE IN DEXTROSE 20-5 MG/100ML-% IV SOLN
0.1250 ug/kg/min | INTRAVENOUS | Status: DC
  Administered 2023-08-30 – 2023-08-31 (×3): 0.25 ug/kg/min via INTRAVENOUS
  Administered 2023-09-01: 0.125 ug/kg/min via INTRAVENOUS
  Filled 2023-08-30 (×4): qty 100

## 2023-08-30 MED ORDER — FUROSEMIDE 10 MG/ML IJ SOLN: 80.0000 mg | Freq: Two times a day (BID) | INTRAMUSCULAR | Status: DC

## 2023-08-30 MED ORDER — POTASSIUM CHLORIDE CRYS ER 20 MEQ PO TBCR
40.0000 meq | EXTENDED_RELEASE_TABLET | Freq: Once | ORAL | Status: AC
  Administered 2023-08-30: 40 meq via ORAL
  Filled 2023-08-30: qty 2

## 2023-08-30 MED ORDER — WARFARIN SODIUM 5 MG PO TABS
5.0000 mg | ORAL_TABLET | Freq: Once | ORAL | Status: AC
  Administered 2023-08-30: 5 mg via ORAL
  Filled 2023-08-30: qty 1

## 2023-08-30 MED ORDER — METOLAZONE 2.5 MG PO TABS
2.5000 mg | ORAL_TABLET | Freq: Once | ORAL | Status: AC
  Administered 2023-08-30: 2.5 mg via ORAL
  Filled 2023-08-30: qty 1

## 2023-08-30 MED ORDER — POTASSIUM CHLORIDE CRYS ER 20 MEQ PO TBCR
60.0000 meq | EXTENDED_RELEASE_TABLET | Freq: Once | ORAL | Status: AC
  Administered 2023-08-30: 60 meq via ORAL
  Filled 2023-08-30: qty 3

## 2023-08-30 NOTE — Progress Notes (Addendum)
 Advanced Heart Failure Rounding Note  Cardiologist: Arvilla Meres, MD   Chief Complaint: RV Failure  Subjective:    Remains on milrinone 0.125, early AM Co-ox 54%. Repeat pending.   Scr remains elevated from baseline but relatively stable at 1.5. Currently off diuretics. CVP 15 today. Denies significant resting dyspnea but c/w O2 requirements, 2L [place].   On Amio gtt at 30/hr, in NSR.   Main complaint is feeling tired and weak. C/w gout pain in rt wrist.    Objective:    Weight Range: 67.9 kg Body mass index is 24.91 kg/m.   Vital Signs:   Temp:  [97.9 F (36.6 C)-98.6 F (37 C)] 98.6 F (37 C) (03/10 0700) Pulse Rate:  [76-86] 80 (03/10 0700) Resp:  [16-21] 21 (03/10 0700) BP: (104-119)/(57-71) 110/69 (03/10 0700) SpO2:  [91 %-97 %] 93 % (03/10 0700) Last BM Date : 08/28/23  Weight change: Filed Weights   08/23/23 0400 08/24/23 0827 08/28/23 0600  Weight: 73 kg 73 kg 67.9 kg   Intake/Output:   Intake/Output Summary (Last 24 hours) at 08/30/2023 1006 Last data filed at 08/30/2023 0454 Gross per 24 hour  Intake 1183.25 ml  Output 1050 ml  Net 133.25 ml      Physical Exam    General:  fatigued appearing. No respiratory difficulty HEENT: normal Neck: supple. JVD elevated to ear. Carotids 2+ bilat; no bruits. No lymphadenopathy or thyromegaly appreciated. Cor: PMI nondisplaced. Regular rate & rhythm. No rubs, gallops or murmurs. Lungs: decreased BS at the bases w/ bibasilar crackles  Abdomen: soft, nontender, +distended.  Extremities: no cyanosis, clubbing, rash, trace b/l ankle edema + RUE PICC  Neuro: alert & oriented x 3, cranial nerves grossly intact. moves all 4 extremities w/o difficulty. Affect pleasant.   Telemetry   NSR 80s, personally reviewed   EKG    N/A  Labs    CBC Recent Labs    08/29/23 1418 08/30/23 0300  WBC 15.2* 15.3*  HGB 8.5* 8.0*  HCT 26.0* 25.2*  MCV 85.2 86.9  PLT 569* 624*   Basic Metabolic Panel Recent  Labs    08/29/23 1418 08/30/23 0300  NA 127* 129*  K 3.4* 3.1*  CL 90* 92*  CO2 23 22  GLUCOSE 96 126*  BUN 27* 27*  CREATININE 1.45* 1.46*  CALCIUM 8.6* 8.4*    BNP (last 3 results) Recent Labs    08/26/23 0404 08/27/23 0458 08/28/23 0414  BNP 375.3* 598.1* 589.0*   Other results:  Medications:    Scheduled Medications:  Chlorhexidine Gluconate Cloth  6 each Topical Daily   colchicine  0.6 mg Oral Daily   docusate sodium  100 mg Oral BID   feeding supplement  237 mL Oral BID BM   fluticasone furoate-vilanterol  1 puff Inhalation Daily   And   umeclidinium bromide  1 puff Inhalation Daily   gabapentin  100 mg Oral TID   insulin aspart  0-15 Units Subcutaneous Q4H   pantoprazole  40 mg Oral Daily   polyethylene glycol  17 g Oral BID   QUEtiapine  25 mg Oral QHS   sennosides  5 mL Oral BID   warfarin  5 mg Oral ONCE-1600   Warfarin - Pharmacist Dosing Inpatient   Does not apply q1600    Infusions:  sodium chloride Stopped (08/20/23 1159)   amiodarone 30 mg/hr (08/30/23 0454)   milrinone 0.125 mcg/kg/min (08/30/23 0454)   piperacillin-tazobactam (ZOSYN)  IV 3.375 g (08/30/23 1003)  PRN Medications: sodium chloride, acetaminophen, ondansetron (ZOFRAN) IV, mouth rinse, oxyCODONE  Patient Profile   Kim Anthony is a 73 y.o. female with a history of mechanical MVR in 2001 in [place], [place], CAD, hx breast cancer s/p left mastectomy and radiation/chemo, tobacco use, COPD, prior PE/DVT, HTN, undergoing workup for possible inflammatory arthritis with Rheumatology, and systolic CHF.   Assessment/Plan   1. Acute on chronic RV Failure HFimpEF Pulmonary HTN  - Suspect likely underlying OSA contributing to PH/RV failure. Also w/ known COPD. - Echo this admit EF 55% and D-shaped septum, RV-RA gradient 25 mmHg and severe TR (does not coapt) - NYHA III-IV on admit - on Milrinone 0.125, early AM co-ox 54%. Repeat pending.   - CVP 15, JVD to ear. Restart IV Lasix 80 mg  bid and supp K    2. AKI/hypokalemia - in the setting of RV congestion/low output  - SCr 1.5 today, b/l 0.8 - early AM Co-ox low, repeat pending. May need to go back up on milrinone - CVP 15, restart IV Lasix - follow SCr   3. PAF - She does not do well in AFL/AF. - Recent admit 2/25 with AFL, converted to NSR on amio, but then stopped d/t long QT - Warfarin has been restarted, INR 2.7. No off heparin gtt  - NSR on telemetry. Continue IV amiodarone while on milrinone.    4. CAD  Elevated Troponin - 1 V CAD with occluded mid RCA on cath (7/22) - hsTrop 113>121>198. Likely demand ischemia, decongest and follow - No s/s angina. - on heparin - continue statin  5. GI bleed Anemia  - hypotension with melena requiring RBCs - EGD 3/04 with gastritis, no other significant findings - Hgb stable.     6. Hx of mechanical mitral valve replacement: - Mechanical MVR in [place] in 2001. - Stable on echo 2/25 MV gradient 3 - Continue Warfarin and heparin. PharmD dosing warfarin.  - Continue SBE prophylaxis.   7. Tricuspid valve regurgitation - severe on echo 2/25 - Follow    8. Right hand/wrist pain Hx gout - Takes prednisone and NSAIDs at home - Suspect acute flare. Uric acid was elevated at 7.7. Completed x3 days of steroids.  - continuing to have moderate pain despite colchicine 0.6mg  daily  - ? CT vs MRI to rule out osteomyelitis or abscess, defer to IM   9. Hyponatremia - Hypervolemic hyponatremia - Na 129 today  - restart diuretics   10. ID - ? Aspiration PNA.  - Ceftriaxone and Flagyl switched to Zosyn 3/05 per CCM - PCT improving down from 5/14>>1.84   11. Hypokalemia - K 3.1 - give aggressive K supp w/ diuresis   Length of Stay: 12  Kim Simmons, PA-C 10:06 AM  Patient seen with PA, I formulated the plan and agree with the above note.   She did not get Lasix over the weekend.  CVP 18 on my read today. She remains on milrinone 0.125 with co-ox 54%.   Creatinine stable at 1.46.   General: NAD Neck: JVP 16+, no thyromegaly or thyroid nodule.  Lungs: Clear to auscultation bilaterally with normal respiratory effort. CV: Nondisplaced PMI.  Heart regular S1/S2, no S3/S4, no murmur.  1+ ankle edema.  Abdomen: Soft, nontender, no hepatosplenomegaly, no distention.  Skin: Intact without lesions or rashes.  Neurologic: Alert and oriented x 3.  Psych: Normal affect. Extremities: No clubbing or cyanosis.  HEENT: Normal.   Patient is volume overloaded with primarily RV  failure likely from cor pulmonale (has h/o OHS/OSA and COPD).   Co-ox 54%.  - Increase milrinone to 0.25 with low co-ox for RV support.  - Lasix 80 mg IV bid with dose of metolazone 2.5 x 1 now.  Replace K.   INR 2.7 with mechanical mitral valve.  Continue coumadin for goal INR 2.5-3.5.   Continue amiodarone gtt while on milrinone given PAF.   Kim Anthony 08/30/2023 1:00 PM

## 2023-08-30 NOTE — Care Management Important Message (Signed)
 Important Message  Patient Details  Name: Kim Anthony [name]: [id number] Date of Birth: Oct 28, 1950   Important Message Given:  Yes - Medicare IM     Dorena Bodo 08/30/2023, 3:19 PM

## 2023-08-30 NOTE — Progress Notes (Signed)
 HD#12 SUBJECTIVE:  Patient Summary: Kim Anthony is a 73 y.o. with a pertinent PMH of biventricular heart failure, mitral valve replacement c/b valvular A-fib on warfarin, CAD and gout, who presented with pretension and melena and admitted for upper GI bleed.   Overnight Events: No overnight event  Interim History: Patient was sitting in the chair during my encounter this morning.  She was in no acute distress and had no concerns overnight.  She endorses feeling better overall compared to when she first came in.  OBJECTIVE:  Vital Signs: Vitals:   08/30/23 0700 08/30/23 1529 08/30/23 1531 08/30/23 1708  BP: 110/69 (!) 92/59 118/80 99/86  Pulse: 80 76 76 77  Resp: (!) 21 19 19 20   Temp: 98.6 F (37 C) 98.7 F (37.1 C)  98.7 F (37.1 C)  TempSrc: Oral Oral  Oral  SpO2: 93% 94% 92% 99%  Weight:      Height:       Supplemental O2: Nasal Cannula SpO2: 99 % O2 Flow Rate (L/min): 2 L/min  Filed Weights   08/23/23 0400 08/24/23 0827 08/28/23 0600  Weight: 73 kg 73 kg 67.9 kg     Intake/Output Summary (Last 24 hours) at 08/30/2023 1737 Last data filed at 08/30/2023 1510 Gross per 24 hour  Intake 1444.02 ml  Output 600 ml  Net 844.02 ml   Net IO Since Admission: -6,622.42 mL [08/30/23 1737]  Physical Exam: Physical Exam Constitutional:      Appearance: Normal appearance.     Comments: Sitting in chair   Neck:     Comments: Mild swelling noted on left neck at the site where central line was placed . Fluctuates on compression  Cardiovascular:     Comments: S3 sound noted on ascultation . No other murmurs appreciated  Pulmonary:     Effort: Pulmonary effort is normal.     Comments: Bilateral crackles appreciated . Skin:    General: Skin is warm and dry.  Neurological:     Mental Status: She is alert.     Patient Lines/Drains/Airways Status     Active Line/Drains/Airways     Name Placement date Placement time Site Days   PICC Triple Lumen 08/18/23 Right  Brachial 40 cm 2 cm 08/18/23  1606  -- 12   External Urinary Catheter 08/19/23  1600  --  11   Wound / Incision (Open or Dehisced) 01/06/21  Breast Left Masectomy Site 01/06/21  0800  Breast  966             ASSESSMENT/PLAN:  Assessment: Principal Problem:   Acute GI bleeding Active Problems:   Biventricular heart failure with reduced left ventricular function (HCC)   Chronic low back pain   COPD (chronic obstructive pulmonary disease) (HCC)   S/P mitral valve replacement   HFrEF (heart failure with reduced ejection fraction) (HCC)   Warfarin anticoagulation   Atrial fibrillation with RVR (HCC)   CKD stage 3a, GFR 45-59 ml/min (HCC)   Postherpetic neuralgia   QT prolongation   Pulmonary hypertension, low resistance (HCC)   Acute delirium   Aspiration pneumonia (HCC)   Plan: #Acute GI bleed  Most likely due to gastritis , found on EGD during admission.  Hgb stabilized. No signs of active bleeding. Will continue Protonix.    # Acute on chronic right ventricular failure,complicated by septic shock #Heart Failure with improved EF #Pulmonary Hypertension  Low co-ox of 54%. Increasing milrinone to 0.25 < 0.125 per cards due to low co-ox  for extra right ventricular support. Mildly volume overload on exam and may need further diuresing. Giving Lasix 80 mg IV twice daily with metolazone 2.5 today.  #Concerns for CVC site hematoma vs Infection Patient had a CVC placed at her right neck. On exam this morning, there seem to be a swelling at the site of the removed cath.  In the setting of her persistent leukocytosis, I worry if this is an infection versus hematoma.  I think it is reasonable to further assess this.  Will order a vascular ultrasound upper extremity venous duplex to further evaluate this swelling. - F/U on [place]  # Acute kidney injury # Hypokalemia Serum creatinine of 1.46< 1.32 < 1.23 , most likely in the setting of low output secondary to right ventricular overload. CVP  of 15 which is concerning given the fact that the patient is not mechanically ventilated.  K of  3.1 < 3.4 < 4.1.  Will replete potassium and repeat co-ox in the morning and trend Cr.  #Paroxysmal Atrial Fibrillation Normal sinus rhythm on telemetry .Cards following .Will continue IV amiodarone.   #Prolonged QT #Demand cardiac ischemia #Paroxysmal A-fib status post cardioversion 2023 #Mechanical atrial valve replacement on Coumadin Patient is chronically stable on coumadin.  INR of 2.7   #Aspiration Pneumonia  On Zosyn, day 5/7.  WBC fairly stable at 15.3 < 15.3 <<<24.3.  Remains on 2 L of nasal cannula satting 91%.  Afebrile.  Will continue on Zosyn until 09/01/2023.  #Delirium Continue bedtime Seroquel and Delirium precaution  #Chronic conditions COPD :Continue Breo Ellipta and Incruse Postherpetic neuralgia : Continue gabapentin  Best Practice: Diet: Regular diet IVF: IV Lasix Code: Full AB: Zosyn Family Contact: Renaud,Monica , to be notified. DISPO: Anticipated discharge tomorrow to Home pending  further diuresing and resolution of shock per cardiology   .  Signature: Kathleen Lime , MD Internal Medicine Resident, PGY-1 [facility]  Pager: [phone] 5:37 PM, 08/30/2023   Please contact the on call pager after 5 pm and on weekends at [phone].

## 2023-08-30 NOTE — TOC Progression Note (Addendum)
 Transition of Care [facility]) - Progression Note    Patient Details  Name: Kim Anthony [name]: [id number] Date of Birth: 02/16/1951  Transition of Care [facility]) CM/SW Contact  Reva Bores, LCSWA Phone Number: 08/30/2023, 10:21 AM  Clinical Narrative:   HF CSW submitted patients  auth: [id number] which is currently pending for [facility]. Per MD patient is not medically ready for dc and should be ready closer towards the end of the week.   HF CSW received a message via secure chat with RN and Unit CSW who stated that the pt was declining SNF.   10:59 AM- HF CSW attempted to meet with pt at bedside. Pt was being seen by nursing staff. CSW will follow up with pt at a more appropriate time.   11:14 AM- CSW attempted to meet with pt at bedside. Pt was being seen by medical team. CSW will follow up with pt at a more appropriate time.   HF CSW spoke with RN and PT, they both stated that the pt has improved significantly and may not need to go to a SNF. Daughter requested a call from CSW.   11:30 AM- HF CSW called and spoke with the pts daughter. Pts daughter requested dc plan updates. CSW stated that she will update as we learn of changes.   TOC will continue following.       Barriers to Discharge: Continued Medical Work up  Expected Discharge Plan and Services       Living arrangements for the past 2 months: Single Family Home                                       Social Determinants of Health (SDOH) Interventions SDOH Screenings   Food Insecurity: No Food Insecurity (08/18/2023)  Housing: Low Risk  (08/18/2023)  Transportation Needs: No Transportation Needs (08/18/2023)  Utilities: Not At Risk (08/18/2023)  Alcohol Screen: Low Risk  (12/01/2022)  Depression (PHQ2-9): Low Risk  (12/01/2022)  Financial Resource Strain: Low Risk  (12/01/2022)  Physical Activity: Inactive (12/01/2022)  Social Connections: Patient Declined (08/18/2023)  Stress: Stress Concern Present (12/01/2022)   Tobacco Use: High Risk (08/24/2023)    Readmission Risk Interventions    02/19/2022    3:36 PM  Readmission Risk Prevention Plan  Transportation Screening Complete  PCP or Specialist Appt within 3-5 Days Complete  HRI or Home Care Consult Complete  Social Work Consult for Recovery Care Planning/Counseling Complete  Palliative Care Screening Not Applicable  Medication Review Oceanographer) Complete

## 2023-08-30 NOTE — Plan of Care (Signed)

## 2023-08-30 NOTE — Telephone Encounter (Signed)
 Error

## 2023-08-30 NOTE — Progress Notes (Signed)
 Physical Therapy Treatment Patient Details Name: Kim Anthony [name]: [id number] DOB: 20-Jul-1950 Today's Date: 08/30/2023   History of Present Illness Pt is 73 yo presenting to ED 2/25 with melena and symptomatic anemia in setting of GI bleed and chronic coumadin use. Underwent R heart cath on 2/28 in setting of acute on chronic RV failure and pulmonary hypertension. EGD 3/4. PMH: gout, CAD, HFrEF, a fib, HTN, GERD, HLD, COPD, hx of breast cancer s/p L mastectomy and radiation/chemo.    PT Comments  Patient doing much better and requiring CGA for step-pivot (with RW) bed to chair and for standing marching. Pt reports she told the nurse she could not walk because the doctor told her she could not walk! Will need to practice steps next visit as pt has 8 steps to enter home.    If plan is discharge home, recommend the following: Assistance with cooking/housework;Assist for transportation;Help with stairs or ramp for entrance;A little help with walking and/or transfers   Can travel by private vehicle     Yes  Equipment Recommendations  None recommended by PT    Recommendations for Other Services       Precautions / Restrictions Precautions Precautions: Fall Recall of Precautions/Restrictions: Impaired Restrictions Weight Bearing Restrictions Per Provider Order: No     Mobility  Bed Mobility Overal bed mobility: Needs Assistance Bed Mobility: Supine to Sit Rolling: Supervision, Used rails   Supine to sit: Contact guard     General bed mobility comments: pt required no physical assist; CGA for lines and safety    Transfers Overall transfer level: Needs assistance Equipment used: Rolling walker (2 wheels) Transfers: Sit to/from Stand, Bed to chair/wheelchair/BSC Sit to Stand: +2 safety/equipment, Contact guard assist, Min assist   Step pivot transfers: +2 safety/equipment, Contact guard assist       General transfer comment: initial stand from bed with min assist; CGA to step  with RW ~3 ft to chair; stood from recliner CGA x 4 reps    Ambulation/Gait             Pre-gait activities: marching in place with RW and CGA x 10 reps     Stairs             Wheelchair Mobility     Tilt Bed    Modified Rankin (Stroke Patients Only)       Balance Overall balance assessment: Needs assistance Sitting-balance support: No upper extremity supported, Feet supported Sitting balance-Leahy Scale: Fair     Standing balance support: During functional activity, No upper extremity supported Standing balance-Leahy Scale: Fair Standing balance comment: able to stand statically without UE support                            Communication Communication Communication: No apparent difficulties  Cognition Arousal: Alert Behavior During Therapy: WFL for tasks assessed/performed   PT - Cognitive impairments: No apparent impairments                         Following commands: Impaired Following commands impaired: Follows one step commands with increased time    Cueing Cueing Techniques: Verbal cues  Exercises General Exercises - Lower Extremity Heel Raises: AROM, Both, 5 reps, Seated (limited AROM)    General Comments General comments (skin integrity, edema, etc.): on 2L with sats 95% throughout      Pertinent Vitals/Pain Pain Assessment Pain Assessment: Faces Faces Pain Scale:  Hurts even more Pain Location: R hand/wrist Pain Descriptors / Indicators: Grimacing, Guarding Pain Intervention(s): Limited activity within patient's tolerance, Other (comment) (pt wearing wrist splint)    Home Living                          Prior Function            PT Goals (current goals can now be found in the care plan section) Acute Rehab PT Goals Patient Stated Goal: to return home PT Goal Formulation: With patient Time For Goal Achievement: 09/08/23 Potential to Achieve Goals: Good Progress towards PT goals: Progressing  toward goals    Frequency    Min 3X/week      PT Plan      Co-evaluation              AM-PAC PT "6 Clicks" Mobility   Outcome Measure  Help needed turning from your back to your side while in a flat bed without using bedrails?: None Help needed moving from lying on your back to sitting on the side of a flat bed without using bedrails?: A Little Help needed moving to and from a bed to a chair (including a wheelchair)?: A Little Help needed standing up from a chair using your arms (e.g., wheelchair or bedside chair)?: A Little Help needed to walk in hospital room?: A Little Help needed climbing 3-5 steps with a railing? : A Little 6 Click Score: 19    End of Session Equipment Utilized During Treatment: Gait belt Activity Tolerance: Patient tolerated treatment well Patient left: with call bell/phone within reach;in chair;with chair alarm set Nurse Communication: Mobility status PT Visit Diagnosis: Other abnormalities of gait and mobility (R26.89);Muscle weakness (generalized) (M62.81)     Time: 1610-9604 PT Time Calculation (min) (ACUTE ONLY): 20 min  Charges:    $Gait Training: 8-22 mins PT General Charges $$ ACUTE PT VISIT: 1 Visit                      Jerolyn Center, PT Acute Rehabilitation Services  Office [phone]    [name] 08/30/2023, 11:00 AM

## 2023-08-30 NOTE — Telephone Encounter (Signed)
 Called to confirm/remind patient of their appointment at the [facility] on 04/01/24. However, patient is currently admitted in the hospital and will need to reschedule.

## 2023-08-30 NOTE — Progress Notes (Signed)
 PHARMACY - ANTICOAGULATION CONSULT NOTE  Pharmacy Consult for heparin> enoxaparin  > warfarin  Indication:  Mechanical mitral valve replacement  Allergies  Allergen Reactions   Lisinopril Swelling   Acetaminophen-Codeine Itching   Propoxyphene Itching    Darvocet   Tape Rash    Plastic   Tramadol Itching, Nausea And Vomiting and Rash    Patient Measurements: Height: 5\' 5"  (165.1 cm) Weight: 67.9 kg (149 lb 11.1 oz) IBW/kg (Calculated) : 57 Heparin Dosing Weight: TBW  Vital Signs: Temp: 98.6 F (37 C) (03/10 0700) Temp Source: Oral (03/10 0700) BP: 110/69 (03/10 0700) Pulse Rate: 80 (03/10 0700)  Labs: Recent Labs    08/28/23 0414 08/28/23 0727 08/29/23 1418 08/30/23 0300  HGB 7.9*  --  8.5* 8.0*  HCT 24.0*  --  26.0* 25.2*  PLT 491*  --  569* 624*  LABPROT 24.4*  --  25.4* 28.8*  INR 2.2*  --  2.3* 2.7*  HEPARINUNFRC 0.83* 1.00* >1.10*  --   CREATININE 1.32*  --  1.45* 1.46*    Estimated Creatinine Clearance: 30.9 mL/min (A) (by C-G formula based on SCr of 1.46 mg/dL (H)).   Medical History: Past Medical History:  Diagnosis Date   Arthritis    Asthma    Breast cancer (HCC) 2001   Left Breast Cancer   CHF (congestive heart failure) (HCC)    COPD (chronic obstructive pulmonary disease) (HCC)    Coronary artery disease    GERD (gastroesophageal reflux disease)    History of mitral valve replacement with mechanical valve    HLD (hyperlipidemia)    Hypertension    Pulmonary embolism (HCC) 2021     Assessment: 78 yoM with hx AFib and mMVR on warfarin (goal INR 2.5-3.5). Warfarin held on admit with melenic stools, heparin started for anticoagulation. EGD 3/4 with no bleeding, heparin and warfarin resumed postop.  Heparin off, INR up to 2.7, CBC ok. Will need to be cautious with warfarin dosing given recent shock, GIB, and new amio.  Home dose = 5mg  daily except 7.5mg  Mon/Thurs (5mg  daily per pt report)  Goal of Therapy:   INR goal 2.5-3.5 Monitor  platelets by anticoagulation protocol: Yes  Plan:  Warfarin 5mg  x1 tonight to ensure remains therapeutic Daily INR, CBC    Fredonia Highland, PharmD, BCPS, [facility] Clinical Pharmacist [phone] Please check AMION for all [facility] Pharmacy numbers 08/30/2023

## 2023-08-31 ENCOUNTER — Encounter (HOSPITAL_COMMUNITY): Payer: Self-pay

## 2023-08-31 ENCOUNTER — Inpatient Hospital Stay (HOSPITAL_COMMUNITY)

## 2023-08-31 ENCOUNTER — Encounter (HOSPITAL_COMMUNITY): Payer: 59

## 2023-08-31 DIAGNOSIS — K922 Gastrointestinal hemorrhage, unspecified: Secondary | ICD-10-CM | POA: Diagnosis not present

## 2023-08-31 DIAGNOSIS — M7989 Other specified soft tissue disorders: Secondary | ICD-10-CM | POA: Diagnosis not present

## 2023-08-31 DIAGNOSIS — I5021 Acute systolic (congestive) heart failure: Secondary | ICD-10-CM

## 2023-08-31 LAB — BASIC METABOLIC PANEL
Anion gap: 13 (ref 5–15)
BUN: 24 mg/dL — ABNORMAL HIGH (ref 8–23)
CO2: 23 mmol/L (ref 22–32)
Calcium: 8.6 mg/dL — ABNORMAL LOW (ref 8.9–10.3)
Chloride: 98 mmol/L (ref 98–111)
Creatinine, Ser: 1.41 mg/dL — ABNORMAL HIGH (ref 0.44–1.00)
GFR, Estimated: 39 mL/min — ABNORMAL LOW (ref >60)
Glucose, Bld: 130 mg/dL — ABNORMAL HIGH (ref 70–99)
Potassium: 4.1 mmol/L (ref 3.5–5.1)
Sodium: 134 mmol/L — ABNORMAL LOW (ref 135–145)

## 2023-08-31 LAB — CBC
HCT: 25.2 % — ABNORMAL LOW (ref 36.0–46.0)
Hemoglobin: 8 g/dL — ABNORMAL LOW (ref 12.0–15.0)
MCH: 28.3 pg (ref 26.0–34.0)
MCHC: 31.7 g/dL (ref 30.0–36.0)
MCV: 89 fL (ref 80.0–100.0)
Platelets: 617 10*3/uL — ABNORMAL HIGH (ref 150–400)
RBC: 2.83 MIL/uL — ABNORMAL LOW (ref 3.87–5.11)
RDW: 19.9 % — ABNORMAL HIGH (ref 11.5–15.5)
WBC: 15.1 10*3/uL — ABNORMAL HIGH (ref 4.0–10.5)
nRBC: 0.1 % (ref 0.0–0.2)

## 2023-08-31 LAB — COOXEMETRY PANEL
Carboxyhemoglobin: 2.3 % — ABNORMAL HIGH (ref 0.5–1.5)
Methemoglobin: 1.4 % (ref 0.0–1.5)
O2 Saturation: 59.8 %
Total hemoglobin: 8.2 g/dL — ABNORMAL LOW (ref 12.0–16.0)

## 2023-08-31 LAB — GLUCOSE, CAPILLARY
Glucose-Capillary: 99 mg/dL (ref 70–99)
Glucose-Capillary: 112 mg/dL — ABNORMAL HIGH (ref 70–99)
Glucose-Capillary: 121 mg/dL — ABNORMAL HIGH (ref 70–99)
Glucose-Capillary: 121 mg/dL — ABNORMAL HIGH (ref 70–99)
Glucose-Capillary: 93 mg/dL (ref 70–99)
Glucose-Capillary: 122 mg/dL — ABNORMAL HIGH (ref 70–99)

## 2023-08-31 LAB — PROTIME-INR
INR: 3.6 — ABNORMAL HIGH (ref 0.8–1.2)
Prothrombin Time: 36 s — ABNORMAL HIGH (ref 11.4–15.2)

## 2023-08-31 MED ORDER — FUROSEMIDE 10 MG/ML IJ SOLN
12.0000 mg/h | INTRAVENOUS | Status: DC
  Administered 2023-08-31 – 2023-09-01 (×2): 12 mg/h via INTRAVENOUS
  Filled 2023-08-31 (×3): qty 20

## 2023-08-31 MED ORDER — PREDNISONE 20 MG PO TABS
40.0000 mg | ORAL_TABLET | Freq: Every day | ORAL | Status: AC
Start: 2023-08-31 — End: 2023-09-02
  Administered 2023-08-31 – 2023-09-01 (×2): 40 mg via ORAL
  Filled 2023-08-31 (×2): qty 2

## 2023-08-31 MED ORDER — METOLAZONE 2.5 MG PO TABS
2.5000 mg | ORAL_TABLET | Freq: Once | ORAL | Status: AC
  Administered 2023-08-31: 2.5 mg via ORAL
  Filled 2023-08-31: qty 1

## 2023-08-31 MED ORDER — WARFARIN SODIUM 1 MG PO TABS
1.0000 mg | ORAL_TABLET | Freq: Once | ORAL | Status: AC
  Administered 2023-08-31: 1 mg via ORAL
  Filled 2023-08-31: qty 1

## 2023-08-31 MED ORDER — POTASSIUM CHLORIDE CRYS ER 20 MEQ PO TBCR
60.0000 meq | EXTENDED_RELEASE_TABLET | Freq: Once | ORAL | Status: AC
  Administered 2023-08-31: 60 meq via ORAL
  Filled 2023-08-31: qty 3

## 2023-08-31 NOTE — Plan of Care (Signed)

## 2023-08-31 NOTE — Progress Notes (Addendum)
 PHARMACY - ANTICOAGULATION CONSULT NOTE  Pharmacy Consult for heparin> enoxaparin  > warfarin  Indication:  Mechanical mitral valve replacement  Allergies  Allergen Reactions   Lisinopril Swelling   Acetaminophen-Codeine Itching   Propoxyphene Itching    Darvocet   Tape Rash    Plastic   Tramadol Itching, Nausea And Vomiting and Rash    Patient Measurements: Height: 5\' 5"  (165.1 cm) Weight: 67.9 kg (149 lb 11.1 oz) IBW/kg (Calculated) : 57 Heparin Dosing Weight: TBW  Vital Signs: Temp: 98.7 F (37.1 C) (03/11 0353) Temp Source: Oral (03/11 0353) BP: 100/66 (03/11 0353) Pulse Rate: 85 (03/11 0353)  Labs: Recent Labs    08/28/23 0727 08/29/23 1418 08/29/23 1418 08/30/23 0300 08/31/23 0417  HGB  --  8.5*   < > 8.0* 8.0*  HCT  --  26.0*  --  25.2* 25.2*  PLT  --  569*  --  624* 617*  LABPROT  --  25.4*  --  28.8* 36.0*  INR  --  2.3*  --  2.7* 3.6*  HEPARINUNFRC 1.00* >1.10*  --   --   --   CREATININE  --  1.45*  --  1.46* 1.41*   < > = values in this interval not displayed.    Estimated Creatinine Clearance: 32 mL/min (A) (by C-G formula based on SCr of 1.41 mg/dL (H)).   Medical History: Past Medical History:  Diagnosis Date   Arthritis    Asthma    Breast cancer (HCC) 2001   Left Breast Cancer   CHF (congestive heart failure) (HCC)    COPD (chronic obstructive pulmonary disease) (HCC)    Coronary artery disease    GERD (gastroesophageal reflux disease)    History of mitral valve replacement with mechanical valve    HLD (hyperlipidemia)    Hypertension    Pulmonary embolism (HCC) 2021     Assessment: 72 yoM with hx AFib and mMVR on warfarin (goal INR 2.5-3.5). Warfarin held on admit with melenic stools, heparin started for anticoagulation. EGD 3/4 with no bleeding, heparin and warfarin resumed postop.  INR today up to 3.6, slightly above goal - will give reduced dose. Pt likely will need reduced dosing going forward with new amio and ongoing  congestion.  Home dose = 5mg  daily except 7.5mg  Mon/Thurs (5mg  daily per pt report)  Goal of Therapy:  INR goal 2.5-3.5 Monitor platelets by anticoagulation protocol: Yes  Plan:  Warfarin 1mg  x1 tonight  Daily INR, CBC    Fredonia Highland, PharmD, BCPS, [facility] Clinical Pharmacist [phone] Please check AMION for all [facility] Pharmacy numbers 08/31/2023

## 2023-08-31 NOTE — Progress Notes (Signed)
 Physical Therapy Treatment Patient Details Name: Kim Anthony [name]: [id number] DOB: [birth year] Today's Date: 08/31/2023   History of Present Illness Pt is 73 yo presenting to ED 2/25 with melena and symptomatic anemia in setting of GI bleed and chronic coumadin use. Underwent R heart cath on 2/28 in setting of acute on chronic RV failure and pulmonary hypertension. EGD 3/4. PMH: gout, CAD, HFrEF, a fib, HTN, GERD, HLD, COPD, hx of breast cancer s/p L mastectomy and radiation/chemo.    PT Comments  Patient sleepy on arrival and reported recent pain medication making her groggy. Agreed to ambulation despite this. Transfers with cues for hand placement with RW and BSC. Progressed to ambulation x 40 ft on RA with sats 95%. Very slow, shuffling gait. Pt agreed she was not up for attempting up/down single step brought to her room. Will continue efforts to prepare for ultimate discharge home.     If plan is discharge home, recommend the following: Assistance with cooking/housework;Assist for transportation;Help with stairs or ramp for entrance;A little help with walking and/or transfers   Can travel by private vehicle     Yes  Equipment Recommendations  None recommended by PT    Recommendations for Other Services       Precautions / Restrictions Precautions Precautions: Fall Recall of Precautions/Restrictions: Impaired Precaution/Restrictions Comments: watch O2 Restrictions Weight Bearing Restrictions Per Provider Order: No     Mobility  Bed Mobility Overal bed mobility: Needs Assistance Bed Mobility: Supine to Sit, Sit to Supine Rolling: Supervision   Supine to sit: Supervision Sit to supine: Supervision        Transfers Overall transfer level: Needs assistance Equipment used: Rolling walker (2 wheels) Transfers: Sit to/from Stand Sit to Stand: Contact guard assist           General transfer comment: From EOB and BSC (3 transfers total)     Ambulation/Gait Ambulation/Gait assistance: Contact guard assist Gait Distance (Feet): 40 Feet Assistive device: Rolling walker (2 wheels) Gait Pattern/deviations: Step-through pattern, Decreased step length - right, Decreased step length - left, Shuffle, Trunk flexed Gait velocity: decreased     General Gait Details: pt feeling sleepy from meds, but wanted to try to walk; short shuffling steps with flexed posture and cues to keep RW closer to her   Stairs Stairs:  (deferred due to sleepiness, poor foot clearance with gait)           Wheelchair Mobility     Tilt Bed    Modified Rankin (Stroke Patients Only)       Balance Overall balance assessment: Needs assistance Sitting-balance support: No upper extremity supported, Feet supported Sitting balance-Leahy Scale: Good Sitting balance - Comments: close supervision, flexed posture   Standing balance support: Bilateral upper extremity supported, During functional activity Standing balance-Leahy Scale: Poor Standing balance comment: reliant on BUE support in standing, reported fearful of letting go of RW to bathe peri region                            Communication Communication Communication: No apparent difficulties  Cognition Arousal: Lethargic, Suspect due to medications (reported recent pain medication) Behavior During Therapy: WFL for tasks assessed/performed   PT - Cognitive impairments: No apparent impairments                         Following commands: Intact Following commands impaired: Follows one step commands with increased time  Cueing Cueing Techniques: Verbal cues  Exercises      General Comments General comments (skin integrity, edema, etc.): on RA with sats 95% throughout; RN notified (pt on 1L at end of session as at beginning)      Pertinent Vitals/Pain Pain Assessment Pain Assessment: Faces Faces Pain Scale: Hurts even more Pain Location: R hand/wrist Pain  Descriptors / Indicators: Grimacing, Guarding Pain Intervention(s): Limited activity within patient's tolerance, Monitored during session    Home Living                          Prior Function            PT Goals (current goals can now be found in the care plan section) Acute Rehab PT Goals Patient Stated Goal: to return home Time For Goal Achievement: 09/08/23 Potential to Achieve Goals: Good Progress towards PT goals: Progressing toward goals    Frequency    Min 3X/week      PT Plan      Co-evaluation              AM-PAC PT "6 Clicks" Mobility   Outcome Measure  Help needed turning from your back to your side while in a flat bed without using bedrails?: None Help needed moving from lying on your back to sitting on the side of a flat bed without using bedrails?: A Little Help needed moving to and from a bed to a chair (including a wheelchair)?: A Little Help needed standing up from a chair using your arms (e.g., wheelchair or bedside chair)?: A Little Help needed to walk in hospital room?: A Little Help needed climbing 3-5 steps with a railing? : A Little 6 Click Score: 19    End of Session Equipment Utilized During Treatment: Gait belt Activity Tolerance: Patient limited by lethargy Patient left: with call bell/phone within reach;in bed;with bed alarm set;with nursing/sitter in room Nurse Communication: Mobility status PT Visit Diagnosis: Other abnormalities of gait and mobility (R26.89);Muscle weakness (generalized) (M62.81)     Time: 3244-0102 PT Time Calculation (min) (ACUTE ONLY): 30 min  Charges:    $Gait Training: 23-37 mins PT General Charges $$ ACUTE PT VISIT: 1 Visit                      Jerolyn Center, PT Acute Rehabilitation Services  Office [phone]    [name] 08/31/2023, 12:20 PM

## 2023-08-31 NOTE — Consult Note (Signed)
 [facility] Liaison Consult Note   08/31/2023  Kim Anthony 1950/10/07 [id number]  Insurance: [employer] Dual Complete  Primary Care Provider: Kathleen Lime, MD, [facility] Internal Medicine, this provider is listed for the transition of care follow up appointments  and [facility] calls   [facility] Liaison came to unit, patient is currently off the floor in a procedure.   The patient was screened for LLOS 16 days and less than  30 day readmission hospitalization with noted extreme risk score for unplanned readmission risk 2 hospital admissions in 6 months.  The patient was assessed for potential [facility] Coordination service needs for post hospital transition for care coordination. Review of patient's electronic medical record reveals patient is followed by the Advanced HF team. .   Plan: [facility] Liaison will continue to follow progress and disposition to asess for post hospital community care coordination/management needs.  Referral request for community care coordination: Anticipate post hospital community [facility] calls for out reach.   [facility] Community Care, Population Health does not replace or interfere with any arrangements made by the Inpatient Transition of Care team.   For questions contact:   Charlesetta Shanks, RN, BSN, CCM [place]  [facility], [facility] Liaison Direct Dial: [phone] or secure chat Email: [email or web address]

## 2023-08-31 NOTE — Progress Notes (Signed)
 Occupational Therapy Treatment Patient Details Name: Kim Anthony [name]: [id number] DOB: 1951-05-06 Today's Date: 08/31/2023   History of present illness Pt is 73 yo presenting to ED 2/25 with melena and symptomatic anemia in setting of GI bleed and chronic coumadin use. Underwent R heart cath on 2/28 in setting of acute on chronic RV failure and pulmonary hypertension. EGD 3/4. PMH: gout, CAD, HFrEF, a fib, HTN, GERD, HLD, COPD, hx of breast cancer s/p L mastectomy and radiation/chemo.   OT comments  Pt making excellent progress towards OT goals. Session focused on full bathing/dressing EOB. Pt limited by continued R wrist pain (with personal wrist brace donned) and endurance deficits. Pt required Min A for UB ADLs, up to Mod A for LB ADLs due to pt unable to manage balance without BUE support in standing. Discussed potential for DC home w/ pt reporting family can assist with ADLs - will need to confirm. Initiated education on safety precautions and ADL modifications as needed at home such as use of BSC at night, sponge bathing rather than attempting showering tasks if family not home.   SpO2 88-91% on RA, placed on 1 L O2 at end of session.Bradd Canary, OTR/L Acute Rehab Services Office: [phone]       If plan is discharge home, recommend the following:  A little help with walking and/or transfers;A little help with bathing/dressing/bathroom;Assistance with cooking/housework   Equipment Recommendations  None recommended by OT (reports having all needed DME)    Recommendations for Other Services      Precautions / Restrictions Precautions Precautions: Fall Precaution/Restrictions Comments: watch O2 Restrictions Weight Bearing Restrictions Per Provider Order: No       Mobility Bed Mobility Overal bed mobility: Needs Assistance Bed Mobility: Supine to Sit, Sit to Supine     Supine to sit: Supervision, Used rails, HOB elevated Sit to supine: Min assist   General bed mobility  comments: assist for BLE back into bed    Transfers Overall transfer level: Needs assistance Equipment used: Rolling walker (2 wheels) Transfers: Sit to/from Stand Sit to Stand: Supervision           General transfer comment: at bedside for standing ADLs     Balance Overall balance assessment: Needs assistance Sitting-balance support: No upper extremity supported, Feet supported Sitting balance-Leahy Scale: Good     Standing balance support: Bilateral upper extremity supported, During functional activity Standing balance-Leahy Scale: Poor Standing balance comment: reliant on BUE support in standing, reported fearful of letting go of RW to bathe peri region                           ADL either performed or assessed with clinical judgement   ADL Overall ADL's : Needs assistance/impaired     Grooming: Set up;Sitting;Wash/dry face   Upper Body Bathing: Minimal assistance;Sitting Upper Body Bathing Details (indicate cue type and reason): to wash back Lower Body Bathing: Minimal assistance;Sit to/from stand;Sitting/lateral leans Lower Body Bathing Details (indicate cue type and reason): pt able to bathe most of LE sitting EOB w/ OT assist to wash feet. In standing, pt reported fearful of letting go of RW to bathe peri region so OT assisted with hygiene Upper Body Dressing : Minimal assistance;Sitting   Lower Body Dressing: Moderate assistance;Sitting/lateral leans;Sit to/from stand                 General ADL Comments: Limited by R wrist pain, endurance deficits and balance  deficits requiring UE support for standing tasks    Extremity/Trunk Assessment Upper Extremity Assessment Upper Extremity Assessment: Right hand dominant;RUE deficits/detail RUE Deficits / Details: in prefab wrist cock up brace from home d/t gout flare per pt. wrist pain limiting ability to hold items, wring out washcloth, etc. RUE Coordination: decreased fine motor   Lower Extremity  Assessment Lower Extremity Assessment: Defer to PT evaluation        Vision   Vision Assessment?: No apparent visual deficits   Perception     Praxis     Communication Communication Communication: No apparent difficulties   Cognition Arousal: Alert Behavior During Therapy: WFL for tasks assessed/performed Cognition: Cognition impaired     Awareness: Intellectual awareness intact   Attention impairment (select first level of impairment): Selective attention Executive functioning impairment (select all impairments): Sequencing OT - Cognition Comments: cognition improving well. able to express needs, show insight into deficits and safety at home (with some cues for problem solving balance/pain issues). humorous during session                 Following commands: Intact        Cueing   Cueing Techniques: Verbal cues  Exercises      Shoulder Instructions       General Comments on RA with ADLs, SpO2 variable 88-91%. reapplied 1 LO2 at end of session w/ SpO2 92%    Pertinent Vitals/ Pain       Pain Assessment Pain Assessment: Faces Faces Pain Scale: Hurts even more Pain Location: R hand/wrist Pain Descriptors / Indicators: Grimacing, Guarding Pain Intervention(s): Monitored during session, Patient requesting pain meds-RN notified  Home Living                                          Prior Functioning/Environment              Frequency  Min 1X/week        Progress Toward Goals  OT Goals(current goals can now be found in the care plan section)  Progress towards OT goals: Progressing toward goals  Acute Rehab OT Goals Patient Stated Goal: go home, not need O2 OT Goal Formulation: With patient Time For Goal Achievement: 09/06/23 Potential to Achieve Goals: Good ADL Goals Pt Will Perform Lower Body Bathing: sit to/from stand;sitting/lateral leans;with min assist Pt Will Perform Lower Body Dressing: with min  assist;sitting/lateral leans;sit to/from stand Pt Will Transfer to Toilet: with min assist;ambulating Pt Will Perform Toileting - Clothing Manipulation and hygiene: with min assist;sitting/lateral leans;sit to/from stand  Plan      Co-evaluation                 AM-PAC OT "6 Clicks" Daily Activity     Outcome Measure   Help from another person eating meals?: None Help from another person taking care of personal grooming?: A Little Help from another person toileting, which includes using toliet, bedpan, or urinal?: A Little Help from another person bathing (including washing, rinsing, drying)?: A Lot Help from another person to put on and taking off regular upper body clothing?: A Little Help from another person to put on and taking off regular lower body clothing?: A Lot 6 Click Score: 17    End of Session Equipment Utilized During Treatment: Rolling walker (2 wheels);Oxygen  OT Visit Diagnosis: Unsteadiness on feet (R26.81);Other abnormalities of gait and mobility (R26.89);Muscle  weakness (generalized) (M62.81)   Activity Tolerance Patient tolerated treatment well   Patient Left in bed;with call bell/phone within reach;with bed alarm set   Nurse Communication Mobility status;Other (comment) (reported peri region rash/discomfort)        Time: 0732-0805 OT Time Calculation (min): 33 min  Charges: OT General Charges $OT Visit: 1 Visit OT Treatments $Self Care/Home Management : 23-37 mins  Bradd Canary, OTR/L Acute Rehab Services Office: [phone]   Lorre Munroe 08/31/2023, 8:14 AM

## 2023-08-31 NOTE — Progress Notes (Signed)
 RUE venous duplex has been completed.  Preliminary results given to Dr. Garey Ham.   Results can be found under chart review under CV PROC. 08/31/2023 5:06 PM Lynell Greenhouse RVT, RDMS

## 2023-08-31 NOTE — Plan of Care (Signed)
  Problem: Education: Goal: Ability to describe self-care measures that may prevent or decrease complications (Diabetes Survival Skills Education) will improve Outcome: Completed/Met Goal: Individualized Educational Video(s) Outcome: Completed/Met   Problem: Coping: Goal: Ability to adjust to condition or change in health will improve Outcome: Completed/Met   Problem: Tissue Perfusion: Goal: Adequacy of tissue perfusion will improve Outcome: Completed/Met   Problem: Clinical Measurements: Goal: Respiratory complications will improve Outcome: Completed/Met Goal: Cardiovascular complication will be avoided Outcome: Completed/Met   Problem: Education: Goal: Understanding of CV disease, CV risk reduction, and recovery process will improve Outcome: Completed/Met Goal: Individualized Educational Video(s) Outcome: Completed/Met   Problem: Activity: Goal: Ability to return to baseline activity level will improve Outcome: Completed/Met   Problem: Cardiovascular: Goal: Ability to achieve and maintain adequate cardiovascular perfusion will improve Outcome: Completed/Met Goal: Vascular access site(s) Level 0-1 will be maintained Outcome: Completed/Met   Problem: Health Behavior/Discharge Planning: Goal: Ability to safely manage health-related needs after discharge will improve Outcome: Completed/Met

## 2023-08-31 NOTE — Progress Notes (Addendum)
 Advanced Heart Failure Rounding Note  Cardiologist: Arvilla Meres, MD   Chief Complaint: RV Failure  Subjective:    Milrinone increased yesterday to 0.25 for low co-ox and IV Lasix resumed.   Co-ox improved from 54>>60% today. 1.6L in UOP yesterday. CVP remains elevated at 14.   Scr stable at 1.4. K 4.1  She denies any current resting dyspnea. Continues w/ right wrist pain.   Objective:    Weight Range: 67.9 kg Body mass index is 24.91 kg/m.   Vital Signs:   Temp:  [97.7 F (36.5 C)-98.7 F (37.1 C)] 98.7 F (37.1 C) (03/11 0353) Pulse Rate:  [76-85] 85 (03/11 0353) Resp:  [19-20] 20 (03/11 0353) BP: (92-118)/(59-86) 100/66 (03/11 0353) SpO2:  [92 %-99 %] 93 % (03/11 0353) Last BM Date : 08/30/23  Weight change: Filed Weights   08/23/23 0400 08/24/23 0827 08/28/23 0600  Weight: 73 kg 73 kg 67.9 kg   Intake/Output:   Intake/Output Summary (Last 24 hours) at 08/31/2023 0817 Last data filed at 08/31/2023 0354 Gross per 24 hour  Intake 829.14 ml  Output 500 ml  Net 329.14 ml      Physical Exam   CVP 14  General:  fatigued appearing. No respiratory difficulty HEENT: normal Neck: supple. JVD elevated to ear. Carotids 2+ bilat; no bruits. No lymphadenopathy or thyromegaly appreciated. Cor: PMI nondisplaced. Regular rate & rhythm. + mechanical valve sounds  Lungs: clear Abdomen: soft, nontender, +distended.  Extremities: no cyanosis, clubbing, rash, edema + RUE PICC  Neuro: alert & oriented x 3, cranial nerves grossly intact. moves all 4 extremities w/o difficulty. Affect pleasant.   Telemetry   NSR 80s, personally reviewed   EKG    N/A  Labs    CBC Recent Labs    08/30/23 0300 08/31/23 0417  WBC 15.3* 15.1*  HGB 8.0* 8.0*  HCT 25.2* 25.2*  MCV 86.9 89.0  PLT 624* 617*   Basic Metabolic Panel Recent Labs    78/46/96 0300 08/31/23 0417  NA 129* 134*  K 3.1* 4.1  CL 92* 98  CO2 22 23  GLUCOSE 126* 130*  BUN 27* 24*   CREATININE 1.46* 1.41*  CALCIUM 8.4* 8.6*    BNP (last 3 results) Recent Labs    08/26/23 0404 08/27/23 0458 08/28/23 0414  BNP 375.3* 598.1* 589.0*   Other results:  Medications:    Scheduled Medications:  Chlorhexidine Gluconate Cloth  6 each Topical Daily   colchicine  0.6 mg Oral Daily   docusate sodium  100 mg Oral BID   feeding supplement  237 mL Oral BID BM   fluticasone furoate-vilanterol  1 puff Inhalation Daily   And   umeclidinium bromide  1 puff Inhalation Daily   furosemide  80 mg Intravenous BID   gabapentin  100 mg Oral TID   insulin aspart  0-15 Units Subcutaneous Q4H   pantoprazole  40 mg Oral Daily   polyethylene glycol  17 g Oral BID   QUEtiapine  25 mg Oral QHS   sennosides  5 mL Oral BID   warfarin  1 mg Oral ONCE-1600   Warfarin - Pharmacist Dosing Inpatient   Does not apply q1600    Infusions:  sodium chloride Stopped (08/20/23 1159)   amiodarone 30 mg/hr (08/31/23 0355)   milrinone 0.25 mcg/kg/min (08/31/23 0353)   piperacillin-tazobactam (ZOSYN)  IV 3.375 g (08/31/23 0346)    PRN Medications: sodium chloride, acetaminophen, ondansetron (ZOFRAN) IV, mouth rinse, oxyCODONE  Patient Profile  Kim Anthony is a 73 y.o. female with a history of mechanical MVR in 2001 in [place], [place], CAD, hx breast cancer s/p left mastectomy and radiation/chemo, tobacco use, COPD, prior PE/DVT, HTN, undergoing workup for possible inflammatory arthritis with Rheumatology, and systolic CHF.   Assessment/Plan   1. Acute on chronic RV Failure HFimpEF Pulmonary HTN  - Suspect likely underlying OSA contributing to PH/RV failure. Also w/ known COPD. - Echo this admit EF 55% and D-shaped septum, RV-RA gradient 25 mmHg and severe TR (does not coapt) - NYHA III-IV on admit - on Milrinone 0.25, Co-ox 60%   - remains volume overloaded, continue IV Lasix 80 mg bid + 2.5 of metolazone and 250 of diamox    2. AKI/hypokalemia - in the setting of RV congestion/low  output  - SCr 1.4 today, b/l 0.8 - continue milrinone support + diuretics per above  - follow SCr   3. PAF - She does not do well in AFL/AF. - Recent admit 2/25 with AFL, converted to NSR on amio, but then stopped d/t long QT - Warfarin has been restarted, INR 3.6.  - NSR on telemetry. Continue IV amiodarone while on milrinone.    4. CAD  Elevated Troponin - 1 V CAD with occluded mid RCA on cath (7/22) - hsTrop 113>121>198. Likely demand ischemia, decongest and follow - No s/s angina. - on heparin - continue statin  5. GI bleed Anemia  - hypotension with melena requiring RBCs - EGD 3/04 with gastritis, no other significant findings - Hgb stable.     6. Hx of mechanical mitral valve replacement: - Mechanical MVR in [place] in 2001. - Stable on echo 2/25 MV gradient 3 - INR 3.6 today. Continue Warfarin. PharmD dosing warfarin.  - Continue SBE prophylaxis.   7. Tricuspid valve regurgitation - severe on echo 2/25 - Follow    8. Right hand/wrist pain Hx gout - Takes prednisone and NSAIDs at home - Suspect acute flare. Uric acid was elevated at 7.7. Completed x3 days of steroids.  - continuing to have moderate pain despite colchicine 0.6mg  daily  - ? CT vs MRI to rule out osteomyelitis or abscess, defer to IM   9. Hyponatremia - Hypervolemic hyponatremia - improving, Na 134 today   10. ID - ? Aspiration PNA.  - Ceftriaxone and Flagyl switched to Zosyn 3/05 per CCM - PCT improving down from 5/14>>1.84   11. Hypokalemia - resolved, K 4.1 today   Length of Stay: 13  Brittainy Simmons, PA-C 8:17 AM  Patient seen with PA, I formulated the plan and agree with the above note.   I/Os net negative 771 cc.  Creatinine stable 1.46 => 1.41.  CVP remains about 16.  Co-ox 60% today on milrinone 0.25.    She remains on Zosyn for possible aspiration PNA.    Still with right wrist pain.   General: NAD Neck: JVP 16 cm, no thyromegaly or thyroid nodule.  Lungs: Occasional  rhonchi CV: Nondisplaced PMI.  Heart regular S1/S2, no S3/S4, no murmur.  No peripheral edema.   Abdomen: Soft, nontender, no hepatosplenomegaly, no distention.  Skin: Intact without lesions or rashes.  Neurologic: Alert and oriented x 3.  Psych: Normal affect. Extremities: No clubbing or cyanosis. Right wrist in splint.  HEENT: Normal.   Patient is volume overloaded with primarily RV failure likely from cor pulmonale (has h/o OHS/OSA and COPD).   Co-ox 60% on milrinone 0.25 for RV support. CVP 16 today.  - Continue  milrinone 0.25 for RV support while we are diuresing.  - Lasix 80 mg IV this morning and will start gtt at 12 mg/hr.  Metolazone 2.5 x 1.  Replace K.     INR 2.7 with mechanical mitral valve.  Continue coumadin for goal INR 2.5-3.5. Elevated 3.6 today, adjust per pharmacy.    Continue amiodarone gtt while on milrinone given PAF.   Zosyn for suspected aspiration PNA.   Right wrist pain, possibly gout.  Per primary service.   Marca Ancona 08/31/2023 10:38 AM

## 2023-08-31 NOTE — Progress Notes (Incomplete)
 Subjective  Patient summary:  Kim Anthony is a 73 y.o. with a pertinent PMH of biventricular heart failure, mitral valve replacement c/b valvular A-fib on warfarin, CAD and gout, who presented with pretension and melena and admitted for upper GI bleed.   Overnight events: No overnight events.  Interval history Patient was sitting in bed and was in no acute distress with no concerns overnight. She reports her cough and SOB is improving. She continues to report pain in her right wrist c/w past gout flares which has previously improved with steroid injections. Notes recent painful flare up of postherpatic neuralgia in the left groin region.  Patient was seen by cardiologist who measured her CVP while the team was present.    Objective  Blood pressure 111/68, pulse 85, temperature 98.4 F (36.9 C), temperature source Oral, resp. rate 20, height 5\' 5"  (1.651 m), weight 70.2 kg, SpO2 93%.   Weight change:    Intake/Output Summary (Last 24 hours) at 08/31/2023 1200 Last data filed at 08/31/2023 1115 Gross per 24 hour  Intake 829.14 ml  Output 2550 ml  Net -1720.86 ml   Net IO Since Admission: -8,604.05 mL [08/31/23 1200]  Labs, images, and other studies Hg 8 CVP 16 Team was present in the room when her Cardiologist Dr. Shirlee Latch took measurement Coox 59.8% Na 134 K 4.1 Cr stable 1.41  UPPER VENOUS STUDY 3/11  Summary:    Right:  Findings consistent with acute deep vein thrombosis involving the right  internal  jugular vein. However, unable to visualize the cephalic and basilic veins.    Left:  No evidence of thrombosis in the subclavian.   Physical exam  General Appearance:     Alert, cooperative, no distress, appears stated age, appears fatigued  Neck:   Positive for bilateral elevated JVD. Swelling notable on R neck near insertion site of PICC       Lungs:     Positive for bilateral crackles at lung bases         Heart:    Midsystolic click, baseline               Extremities:   Extremities normal, atraumatic, no cyanosis or leg edema. B/L LE warm. Mono arthropathy noted on right MCP.  Pulses:   2+ and symmetric all extremities  Skin:   Skin color, texture, turgor normal, no rashes or lesions. Left groin has no evidence of vesicles or rash.    Assessment and plan Hospital day 13  Kim Anthony is a 73 y.o. female with a PMH of gout, HFimpEF, COPD, ABLA, Acute GI bleed 2/2 gastritis, postherpatic neuralgia, PAF, Pulm HTN who was upper GI bleed, melena, and hypotension.  Active Problems: #1 Biventricular heart failure with reduced left ventricular function (HCC)  Pulmonary hypertension, low resistance (HCC) Stable. Likely 2/2 to overlying OSA w/ known COPD. Saw cardiologist Dr. Shirlee Latch yestrday who Restarted IV Lasix 80 mg bid and supp K. CVP improved. Na improved and Potassium now in normal range. Cr stable.   Plan - Continue Milrinone 0.125, early AM co-ox. Repeat pending.   - Continue Lasix  #2 R wrist pain  evidence of monoarthryopathy on R MCP Plan - Start prednisone for gout flares  #3 S/P mitral valve replacement Warfarin anticoagulation Plan Patient is chronically stable on coumadin.  INR of 3.6   #4 Atrial fibrillation with RVR (HCC)  QT prolongation  Stable. Remains in NSR on  tele.    Plan  - Continue IV amiodarone while on milrinone (cardio agrees)  #5 CKD stage 3a, GFR 45-59 ml/min (HCC) Improved. AM BMP showed evidence of Na 134. Plan - F/U BMP  #6 R Neck DVT. Swelling over site of previous IJ CVC.   Plan - Continue warfarin and monitor   Chronic conditions: #COPD: Continue breo ellipta and incruse  #Chronic Back Pain: Continue to hold NSAIDs indefinitely #Postherpetic Neuralgia: Increase dose of gabapentin    Resolved Problems:   Acute GI bleeding: Continue Protonix 40 mg daily, F/U daily CBC   Hypotension   Acute renal failure (HCC)   Anemia   ABLA (acute blood loss anemia)   Acute delirium    Aspiration pneumonia (HCC)  VTE prophylaxis:  Diet: Regular diet IVF: IV Lasix Code: Full AB: Zosyn PT/OT recommendations: N/A Family Contact: Puccini,Monica , to be notified. Discharge plan: ***   This is a Psychologist, occupational Note.  Rosana Fret, Medical Student discussed the care of the patient with Dr. [name] and the assessment and plan formulated with their assistance.  Please see their attached note for official documentation of the daily encounter.

## 2023-09-01 DIAGNOSIS — K922 Gastrointestinal hemorrhage, unspecified: Secondary | ICD-10-CM | POA: Diagnosis not present

## 2023-09-01 DIAGNOSIS — N179 Acute kidney failure, unspecified: Secondary | ICD-10-CM

## 2023-09-01 DIAGNOSIS — I50810 Right heart failure, unspecified: Secondary | ICD-10-CM

## 2023-09-01 DIAGNOSIS — N1832 Chronic kidney disease, stage 3b: Secondary | ICD-10-CM

## 2023-09-01 LAB — COOXEMETRY PANEL
Carboxyhemoglobin: 2 % — ABNORMAL HIGH (ref 0.5–1.5)
Carboxyhemoglobin: 1.6 % — ABNORMAL HIGH (ref 0.5–1.5)
Methemoglobin: <0.7 % (ref 0.0–1.5)
Methemoglobin: <0.7 % (ref 0.0–1.5)
O2 Saturation: 55.4 %
O2 Saturation: 51.8 %
Total hemoglobin: 8.8 g/dL — ABNORMAL LOW (ref 12.0–16.0)
Total hemoglobin: 10.1 g/dL — ABNORMAL LOW (ref 12.0–16.0)

## 2023-09-01 LAB — CBC
HCT: 26.9 % — ABNORMAL LOW (ref 36.0–46.0)
Hemoglobin: 8.7 g/dL — ABNORMAL LOW (ref 12.0–15.0)
MCH: 28.2 pg (ref 26.0–34.0)
MCHC: 32.3 g/dL (ref 30.0–36.0)
MCV: 87.3 fL (ref 80.0–100.0)
Platelets: 740 10*3/uL — ABNORMAL HIGH (ref 150–400)
RBC: 3.08 MIL/uL — ABNORMAL LOW (ref 3.87–5.11)
RDW: 19.9 % — ABNORMAL HIGH (ref 11.5–15.5)
WBC: 17.7 10*3/uL — ABNORMAL HIGH (ref 4.0–10.5)
nRBC: 0 % (ref 0.0–0.2)

## 2023-09-01 LAB — GLUCOSE, CAPILLARY
Glucose-Capillary: 134 mg/dL — ABNORMAL HIGH (ref 70–99)
Glucose-Capillary: 134 mg/dL — ABNORMAL HIGH (ref 70–99)
Glucose-Capillary: 134 mg/dL — ABNORMAL HIGH (ref 70–99)
Glucose-Capillary: 152 mg/dL — ABNORMAL HIGH (ref 70–99)
Glucose-Capillary: 171 mg/dL — ABNORMAL HIGH (ref 70–99)
Glucose-Capillary: 172 mg/dL — ABNORMAL HIGH (ref 70–99)
Glucose-Capillary: 182 mg/dL — ABNORMAL HIGH (ref 70–99)

## 2023-09-01 LAB — PROTIME-INR
INR: 2.3 — ABNORMAL HIGH (ref 0.8–1.2)
Prothrombin Time: 25.8 s — ABNORMAL HIGH (ref 11.4–15.2)

## 2023-09-01 LAB — BASIC METABOLIC PANEL
Anion gap: 11 (ref 5–15)
BUN: 37 mg/dL — ABNORMAL HIGH (ref 8–23)
CO2: 28 mmol/L (ref 22–32)
Calcium: 9 mg/dL (ref 8.9–10.3)
Chloride: 90 mmol/L — ABNORMAL LOW (ref 98–111)
Creatinine, Ser: 1.88 mg/dL — ABNORMAL HIGH (ref 0.44–1.00)
GFR, Estimated: 28 mL/min — ABNORMAL LOW (ref >60)
Glucose, Bld: 171 mg/dL — ABNORMAL HIGH (ref 70–99)
Potassium: 3.3 mmol/L — ABNORMAL LOW (ref 3.5–5.1)
Sodium: 129 mmol/L — ABNORMAL LOW (ref 135–145)

## 2023-09-01 MED ORDER — POTASSIUM CHLORIDE CRYS ER 20 MEQ PO TBCR
40.0000 meq | EXTENDED_RELEASE_TABLET | ORAL | Status: AC
  Administered 2023-09-01 (×2): 40 meq via ORAL
  Filled 2023-09-01 (×2): qty 2

## 2023-09-01 MED ORDER — TORSEMIDE 20 MG PO TABS: 100.0000 mg | ORAL_TABLET | Freq: Every day | ORAL | Status: DC

## 2023-09-01 MED ORDER — WARFARIN SODIUM 4 MG PO TABS
4.0000 mg | ORAL_TABLET | Freq: Once | ORAL | Status: AC
  Administered 2023-09-01: 4 mg via ORAL
  Filled 2023-09-01: qty 1

## 2023-09-01 NOTE — Progress Notes (Addendum)
 Advanced Heart Failure Rounding Note  Cardiologist: Arvilla Meres, MD   Chief Complaint: RV Failure  Subjective:    Remains on milrinone 0.25 mcg. CO-OX 55%. Diuresing with lasix drip. Negative > 3 liters.   Denies SOB. Wants to get oob.    Objective:    Weight Range: 68.3 kg Body mass index is 25.06 kg/m.   Vital Signs:   Temp:  [98.2 F (36.8 C)-98.8 F (37.1 C)] 98.6 F (37 C) (03/12 0319) Pulse Rate:  [79-88] 86 (03/12 0600) Resp:  [15-20] 15 (03/12 0600) BP: (107-111)/(60-93) 110/60 (03/12 0319) SpO2:  [93 %-97 %] 95 % (03/12 0600) Weight:  [68.3 kg-70.2 kg] 68.3 kg (03/12 0519) Last BM Date : 08/30/23  Weight change: Filed Weights   08/28/23 0600 08/31/23 0929 09/01/23 0519  Weight: 67.9 kg 70.2 kg 68.3 kg   Intake/Output:   Intake/Output Summary (Last 24 hours) at 09/01/2023 0705 Last data filed at 09/01/2023 0700 Gross per 24 hour  Intake 1586.45 ml  Output 4900 ml  Net -3313.55 ml     CVP -9  Physical Exam   General:   No resp difficulty Neck: supple. JVP 8-9  Cor: PMI nondisplaced. Regular rate & rhythm. No rubs, gallops or murmurs. Lungs: clear Abdomen: soft, nontender, nondistended.  Extremities: no cyanosis, clubbing, rash, edema Neuro: alert & oriented x3    Telemetry   SR 80s   EKG    N/A  Labs    CBC Recent Labs    08/31/23 0417 09/01/23 0523  WBC 15.1* 17.7*  HGB 8.0* 8.7*  HCT 25.2* 26.9*  MCV 89.0 87.3  PLT 617* 740*   Basic Metabolic Panel Recent Labs    40/98/11 0300 08/31/23 0417  NA 129* 134*  K 3.1* 4.1  CL 92* 98  CO2 22 23  GLUCOSE 126* 130*  BUN 27* 24*  CREATININE 1.46* 1.41*  CALCIUM 8.4* 8.6*    BNP (last 3 results) Recent Labs    08/26/23 0404 08/27/23 0458 08/28/23 0414  BNP 375.3* 598.1* 589.0*   Other results:  Medications:    Scheduled Medications:  Chlorhexidine Gluconate Cloth  6 each Topical Daily   colchicine  0.6 mg Oral Daily   docusate sodium  100 mg Oral BID    feeding supplement  237 mL Oral BID BM   fluticasone furoate-vilanterol  1 puff Inhalation Daily   And   umeclidinium bromide  1 puff Inhalation Daily   gabapentin  100 mg Oral TID   insulin aspart  0-15 Units Subcutaneous Q4H   pantoprazole  40 mg Oral Daily   polyethylene glycol  17 g Oral BID   predniSONE  40 mg Oral Daily   QUEtiapine  25 mg Oral QHS   sennosides  5 mL Oral BID   Warfarin - Pharmacist Dosing Inpatient   Does not apply q1600    Infusions:  amiodarone 30 mg/hr (09/01/23 0700)   furosemide (LASIX) 200 mg in dextrose 5 % 100 mL (2 mg/mL) infusion 12 mg/hr (09/01/23 0700)   milrinone 0.25 mcg/kg/min (09/01/23 0700)    PRN Medications: acetaminophen, ondansetron (ZOFRAN) IV, mouth rinse, oxyCODONE  Patient Profile   Kim Anthony is a 73 y.o. female with a history of mechanical MVR in 2001 in [place], [place], CAD, hx breast cancer s/p left mastectomy and radiation/chemo, tobacco use, COPD, prior PE/DVT, HTN, undergoing workup for possible inflammatory arthritis with Rheumatology, and systolic CHF.   Assessment/Plan   1. Acute on chronic RV  Failure HFimpEF Pulmonary HTN  - Suspect likely underlying OSA contributing to PH/RV failure. Also w/ known COPD. - Echo this admit EF 55% and D-shaped septum, RV-RA gradient 25 mmHg and severe TR (does not coapt) - NYHA III-IV on admit - on Milrinone 0.25, Co-ox 55%. Cut back milrinone 0.125 mcg. Check CO-OX noon.  - CVP down 8-9. With RV failure will stop lasix drip. Possibly  torsemide 100 mg daily tomorrow.  - Creatinine trending up. May need to hold    2. AKI/hypokalemia - in the setting of RV congestion/low output  - SCr baseline  0.8. ---> 1.8 today  - continue milrinone support + diuretics per above  - follow SCr   3. PAF - She does not do well in AFL/AF. - Recent admit 2/25 with AFL, converted to NSR on amio, but then stopped d/t long QT - Warfarin has been restarted, INR 2.3  - NSR on telemetry. Continue IV  amiodarone while on milrinone.    4. CAD  Elevated Troponin - 1 V CAD with occluded mid RCA on cath (7/22) - hsTrop 113>121>198. Likely demand ischemia, decongest and follow - No chest pain.  - continue statin  5. GI bleed Anemia  - hypotension with melena requiring RBCs - EGD 3/04 with gastritis, no other significant findings -Hgb stable.     6. Hx of mechanical mitral valve replacement: - Mechanical MVR in [place] in 2001. - Stable on echo 2/25 MV gradient 3 - INR 2.3 today. Continue Warfarin. Discussed with Pharm D. .  - Continue SBE prophylaxis.   7. Tricuspid valve regurgitation - severe on echo 2/25 - Follow    8. Right hand/wrist pain Hx gout - Takes prednisone and NSAIDs at home - Suspect acute flare. Uric acid was elevated at 7.7. Completed x3 days of steroids.  - continuing to have moderate pain despite colchicine 0.6mg  daily  - ? CT vs MRI to rule out osteomyelitis or abscess, defer to IM   9. Hyponatremia - Hypervolemic hyponatremia BMET pending.   10. ID - ? Aspiration PNA.  - Completed antibiotics.   11. Hypokalemia -Supp K.   Discussed with Dr Shirlee Latch. CO-OX 51%. For now continue milrinone 0.125 mcg. No increase. BMET in am.    Length of Stay: 14  Tonye Becket, NP 7:05 AM  Patient seen with NP, I formulated the plan and agree with the above note.   Co-ox 55% on milrinone 0.25 this morning, we decreased to 0.125 and repeat co-ox 51%.  CVP 8-9 with creatinine up to 1.88, excellent diuresis yesterday with weight down and I/Os net negative 3314.  No dyspnea this morning, main complaint is still right wrist pain.   General: NAD Neck: JVP 8-9 cm, no thyromegaly or thyroid nodule.  Lungs: Distant BS.  CV: Nondisplaced PMI.  Heart regular S1/S2, no S3/S4, no murmur.  No peripheral edema.   Abdomen: Soft, nontender, no hepatosplenomegaly, no distention.  Skin: Intact without lesions or rashes.  Neurologic: Alert and oriented x 3.  Psych: Normal  affect. Extremities: No clubbing or cyanosis.  HEENT: Normal.   Patient is volume overloaded with primarily RV failure likely from cor pulmonale (has h/o OHS/OSA and COPD).   Co-ox 51% on milrinone 0.125 for RV support. CVP 8-9 today, improved.  This is probably as good as we will get with RV failure.  - Continue milrinone 0.125 for now, will aim to stop tomorrow.  - Stop diuretics today, probably back to home torsemide 100 mg  daily tomorrow if creatinine stabilizes.    INR 2.3 with mechanical mitral valve.  Continue coumadin for goal INR 2.5-3.5.    Continue amiodarone gtt while on milrinone given PAF.    Antibiotics completed for possible aspiration PNA.    Right wrist pain, possibly gout.  Per primary service, on prednisone.  Marca Ancona 09/01/2023 10:26 AM

## 2023-09-01 NOTE — Progress Notes (Signed)
 Physical Therapy Treatment Patient Details Name: Kim Anthony [name]: [id number] DOB: September 30, 1950 Today's Date: 09/01/2023   History of Present Illness Pt is 73 yo presenting to ED 2/25 with melena and symptomatic anemia in setting of GI bleed and chronic coumadin use. 2/28 R heart cath in setting of acute on chronic RV failure and pulmonary hypertension. 3/11 R internal jugular DVT. PMH: gout, CAD, HFrEF, a fib, HTN, GERD, HLD, COPD, hx of breast cancer s/p L mastectomy and radiation/chemo.    PT Comments  Pt in bed upon arrival and agreeable to PT session. Worked on gait training and stair negotiation in today's session. Pt was able to perform 3 sets of ascending/descending 2 steps at a time. Pt required standing rest breaks in between sets due to fatigue. Pt had increased steadiness during stair negotiation when using sideways step-to pattern and 1 hand rail, requiring CGA for safety. Pt continues to fatigue easily and was only able to ambulate ~30 ft before needing to have a seated rest break. Pt is progressing well towards goals. Pt states she will have 24/7 physical assist available at home from daughter, however, unclear if information is accurate. If pt continues to progress well and has 24/7 physical assist available, plan for d/c home with HHPT. Acute PT to follow.      If plan is discharge home, recommend the following: Assistance with cooking/housework;Assist for transportation;Help with stairs or ramp for entrance;A little help with walking and/or transfers   Can travel by private vehicle     Yes  Equipment Recommendations  None recommended by PT       Precautions / Restrictions Precautions Precautions: Fall Precaution/Restrictions Comments: watch O2 Restrictions Weight Bearing Restrictions Per Provider Order: No     Mobility  Bed Mobility    General bed mobility comments: in recliner upon arrival    Transfers Overall transfer level: Needs assistance Equipment used:  Rolling walker (2 wheels) Transfers: Sit to/from Stand Sit to Stand: Contact guard assist    General transfer comment: CGA for safety with cues for hand placement with RW. Pt forget to reach back towards recliner for transition to sitting    Ambulation/Gait Ambulation/Gait assistance: Contact guard assist Gait Distance (Feet): 30 Feet Assistive device: Rolling walker (2 wheels) Gait Pattern/deviations: Step-through pattern, Decreased step length - right, Decreased step length - left, Shuffle, Trunk flexed Gait velocity: decreased    General Gait Details: Short and steady steps. With increased distance, pt would take shorter steps and have increased trunk flexion. Cues for RW proximity   Stairs Stairs: Yes Stairs assistance: Min assist, Contact guard assist Stair Management: One rail Right, One rail Left, Two rails, Step to pattern, Sideways, Forwards Number of Stairs: 6 (x2, x2, x2) General stair comments: Able to ascend/descend 2 steps at a time due to fatigue and lines. For initial 2 steps, pt held onto L and R rails with decreased balance and MinA required. For the next two sets, pt able to use sideways step to pattern with BUE on rail, CGA for safety.      Balance Overall balance assessment: Needs assistance Sitting-balance support: No upper extremity supported, Feet supported Sitting balance-Leahy Scale: Good     Standing balance support: Bilateral upper extremity supported, During functional activity Standing balance-Leahy Scale: Poor Standing balance comment: reliant on RW       Communication Communication Communication: No apparent difficulties  Cognition Arousal: Alert Behavior During Therapy: WFL for tasks assessed/performed   PT - Cognitive impairments: No apparent  impairments    Following commands: Intact Following commands impaired: Follows one step commands with increased time, Only follows one step commands consistently    Cueing Cueing Techniques:  Verbal cues    09/01/23 1612  Other Exercises  Other Exercises use B LE to push recliner while seated, x80 ft       General Comments General comments (skin integrity, edema, etc.): SpO2 >92% on 2L      Pertinent Vitals/Pain Pain Assessment Pain Assessment: No/denies pain     PT Goals (current goals can now be found in the care plan section) Acute Rehab PT Goals Patient Stated Goal: to return home PT Goal Formulation: With patient Time For Goal Achievement: 09/08/23 Potential to Achieve Goals: Good Progress towards PT goals: Progressing toward goals    Frequency    Min 3X/week       AM-PAC PT "6 Clicks" Mobility   Outcome Measure  Help needed turning from your back to your side while in a flat bed without using bedrails?: None Help needed moving from lying on your back to sitting on the side of a flat bed without using bedrails?: A Little Help needed moving to and from a bed to a chair (including a wheelchair)?: A Little Help needed standing up from a chair using your arms (e.g., wheelchair or bedside chair)?: A Little Help needed to walk in hospital room?: A Little Help needed climbing 3-5 steps with a railing? : A Little 6 Click Score: 19    End of Session Equipment Utilized During Treatment: Gait belt;Oxygen Activity Tolerance: Patient tolerated treatment well Patient left: in chair;with call bell/phone within reach;with chair alarm set Nurse Communication: Mobility status PT Visit Diagnosis: Other abnormalities of gait and mobility (R26.89);Muscle weakness (generalized) (M62.81)     Time: 1610-9604 PT Time Calculation (min) (ACUTE ONLY): 28 min  Charges:    $Gait Training: 8-22 mins $Therapeutic Activity: 8-22 mins PT General Charges $$ ACUTE PT VISIT: 1 Visit                     Hilton Cork, PT, DPT Secure Chat Preferred  Rehab Office [phone]   Arturo Morton Brion Aliment 09/01/2023, 4:23 PM

## 2023-09-01 NOTE — Progress Notes (Signed)
 PHARMACY - ANTICOAGULATION CONSULT NOTE  Pharmacy Consult for heparin> enoxaparin  > warfarin  Indication:  Mechanical mitral valve replacement  Allergies  Allergen Reactions   Lisinopril Swelling   Acetaminophen-Codeine Itching   Propoxyphene Itching    Darvocet   Tape Rash    Plastic   Tramadol Itching, Nausea And Vomiting and Rash    Patient Measurements: Height: 5\' 5"  (165.1 cm) Weight: 68.3 kg (150 lb 9.2 oz) IBW/kg (Calculated) : 57 Heparin Dosing Weight: TBW  Vital Signs: Temp: 98.6 F (37 C) (03/12 0319) Temp Source: Oral (03/12 0319) BP: 110/60 (03/12 0319) Pulse Rate: 86 (03/12 0600)  Labs: Recent Labs    08/29/23 1418 08/30/23 0300 08/31/23 0417 09/01/23 0523  HGB 8.5* 8.0* 8.0* 8.7*  HCT 26.0* 25.2* 25.2* 26.9*  PLT 569* 624* 617* 740*  LABPROT 25.4* 28.8* 36.0* 25.8*  INR 2.3* 2.7* 3.6* 2.3*  HEPARINUNFRC >1.10*  --   --   --   CREATININE 1.45* 1.46* 1.41*  --     Estimated Creatinine Clearance: 32 mL/min (A) (by C-G formula based on SCr of 1.41 mg/dL (H)).   Medical History: Past Medical History:  Diagnosis Date   Acute delirium 08/30/2023   Acute GI bleeding 08/18/2023   Arthritis    Asthma    Breast cancer (HCC) 2001   Left Breast Cancer   CHF (congestive heart failure) (HCC)    COPD (chronic obstructive pulmonary disease) (HCC)    Coronary artery disease    GERD (gastroesophageal reflux disease)    History of mitral valve replacement with mechanical valve    HLD (hyperlipidemia)    Hypertension    Pulmonary embolism (HCC) 2021     Assessment: 63 yoM with hx AFib and mMVR on warfarin (goal INR 2.5-3.5). Warfarin held on admit with melenic stools, heparin started for anticoagulation. EGD 3/4 with no bleeding, heparin and warfarin resumed postop.  INR back down today 3.6 > 2.3. Pt likely will need reduced dosing going forward with new amio and ongoing congestion.  Home dose = 5mg  daily except 7.5mg  Mon/Thurs (5mg  daily per pt  report)  Goal of Therapy:  INR goal 2.5-3.5 Monitor platelets by anticoagulation protocol: Yes  Plan:  Warfarin 4 mg x1 tonight  Daily INR, CBC   Reece Leader, Colon Flattery, [facility] Clinical Pharmacist  09/01/2023 7:43 AM   [facility] pharmacy phone numbers are listed on [email or web address]

## 2023-09-01 NOTE — Plan of Care (Signed)
  Problem: Fluid Volume: Goal: Ability to maintain a balanced intake and output will improve Outcome: Progressing   Problem: Health Behavior/Discharge Planning: Goal: Ability to identify and utilize available resources and services will improve Outcome: Progressing Goal: Ability to manage health-related needs will improve Outcome: Progressing   Problem: Metabolic: Goal: Ability to maintain appropriate glucose levels will improve Outcome: Progressing   Problem: Nutritional: Goal: Maintenance of adequate nutrition will improve Outcome: Progressing Goal: Progress toward achieving an optimal weight will improve Outcome: Progressing   Problem: Skin Integrity: Goal: Risk for impaired skin integrity will decrease Outcome: Progressing   Problem: Education: Goal: Knowledge of General Education information will improve Description: Including pain rating scale, medication(s)/side effects and non-pharmacologic comfort measures Outcome: Progressing   Problem: Health Behavior/Discharge Planning: Goal: Ability to manage health-related needs will improve Outcome: Progressing   Problem: Clinical Measurements: Goal: Ability to maintain clinical measurements within normal limits will improve Outcome: Progressing Goal: Will remain free from infection Outcome: Progressing Goal: Diagnostic test results will improve Outcome: Progressing   Problem: Activity: Goal: Risk for activity intolerance will decrease Outcome: Progressing   Problem: Nutrition: Goal: Adequate nutrition will be maintained Outcome: Progressing   Problem: Coping: Goal: Level of anxiety will decrease Outcome: Progressing   Problem: Elimination: Goal: Will not experience complications related to bowel motility Outcome: Progressing Goal: Will not experience complications related to urinary retention Outcome: Progressing   Problem: Pain Managment: Goal: General experience of comfort will improve and/or be  controlled Outcome: Progressing   Problem: Safety: Goal: Ability to remain free from injury will improve Outcome: Progressing   Problem: Skin Integrity: Goal: Risk for impaired skin integrity will decrease Outcome: Progressing

## 2023-09-01 NOTE — Procedures (Deleted)
 Extubation Procedure Note  Patient Details:   Name: Kim Anthony DOB: 05-17-1951 [name]: [id number]   Airway Documentation:    Vent end date: (not recorded) Vent end time: (not recorded)   Evaluation  O2 sats: stable throughout Complications: No apparent complications Patient did tolerate procedure well. Bilateral Breath Sounds: Clear, Diminished   Yes Pt extubated to 2L [place]. Positive cuff leak noted.  Allen Norris 09/01/2023, 12:09 PM

## 2023-09-01 NOTE — Progress Notes (Cosign Needed Addendum)
 Subjective: Kim Anthony a 73 year old with biventricular HF, COPD, PAF, gout, who was admitted for acute upper GI bleed with hypotension and melena.  Interval history Pt reports cough and dyspnea is improving. She notes her R wrist pain not improving currently and has some dryness under her brace that is itchy. We informed her to remove the brace to see if this improves. Denies chest pain.  Overnight events:  Ultrasound venous duplex show acute DVT at R internal jugular vein. Increased gabapentin for her postherpatic neuralgia 100mg . D/c colchicine, started on prednisone 40 mg for gout flares. HF team has decreased her milrinone from 0.25 to 0.125mg . Her Warfarin dose was decreased to 1mg  in the setting of a decreased INR.   Objective: Physical exam Blood pressure (!) 130/111, pulse 82, temperature 98.7 F (37.1 C), temperature source Oral, resp. rate 18, height 5\' 5"  (1.651 m), weight 68.3 kg, SpO2 90%.  Constitutional:      Appearance: Normal appearance.     Comments: Sitting in chair   Cardiovascular:     Comments: S3 sound noted on auscultation. No other murmurs appreciated  Pulmonary:     Effort: Pulmonary effort is normal.     Comments: Bilateral crackles appreciated at lung bases. Skin:    General: Skin is warm and dry. Dry skin underneath the brace on the right wrist. Neurological:     Mental Status: She is alert.  MSK: No effusion noted at the right wrist.     Intake/Output Summary (Last 24 hours) at 09/01/2023 1416 Last data filed at 09/01/2023 0900 Gross per 24 hour  Intake 1826.45 ml  Output 3500 ml  Net -1673.55 ml   Net IO Since Admission: -11,327.6 mL [09/01/23 1416] I/O:  +1586 ml in total out in urine.  Net - today  Labs, images, and other studies Hg 8.7 now, 8 CVP 17 stable  Coox 55.4 Na 129 K 3.3 Cr 1.81  GFR 28  UPPER VENOUS STUDY 3/11  Summary:    Right:  Findings consistent with acute deep vein thrombosis  involving the right  internal  jugular vein. However, unable to visualize the cephalic and basilic veins.    Left:  No evidence of thrombosis in the subclavian.   Assessment and plan Hospital day 14  Kim Anthony is a 73 y.o. with a PMH of biventricular HF, pulm HTN, gout, COPD, paroxysmal afib, CKD who was admitted for acute upper GI bleed 2/2 gastritis with melena and hypotension.    Active Problems:   Biventricular heart failure with reduced left ventricular function (HCC)   Chronic low back pain   COPD (chronic obstructive pulmonary disease) (HCC)   S/P mitral valve replacement   HFrEF (heart failure with reduced ejection fraction) (HCC)   Warfarin anticoagulation   Atrial fibrillation with RVR (HCC)   CKD stage 3a, GFR 45-59 ml/min (HCC)   Postherpetic neuralgia   QT prolongation   Pulmonary hypertension, low resistance (HCC)   Acute heart failure with reduced ejection fraction (HFrEF, <= 40%) (HCC)  #1 Biventricular heart failure with reduced left ventricular function (HCC)  Pulmonary hypertension, low resistance (HCC) Stable. Likely 2/2 to overlying OSA w/ known COPD. Pt saw heart failure today who discontinued Lasix drip due to RV failure. They are considering starting torsemide 100mg  daily. CVP improved but co-ox dropped, will continue to monitor.   Plan - Continue Milrinone 0.125  -  Repeat co-ox.  - Possibly start torsemide 100mg  daily, But will monitor Cr - F/U BMP   #2 Atrial fibrillation with RVR (HCC)  QT prolongation  Stable. Remains in NSR on tele.    Plan  - Decrease dose of IV amiodarone while on new dose of 0.125 milrinone   #3 S/P mitral valve replacement Warfarin anticoagulation Patient is chronically stable on coumadin.  INR now 2.3.  Plan Continue new Warfarin dose 1mg  F/U INR  #4 R Jugular venous DVT on [place].  Low risk of PE at this time while on warfarin. Will continue to monitor for signs of PE. Plan: stable on Warfarin  anticoagulation.  #5 R monoarthopathy 2/2 gout   With no evidence of effusion at the wrist or signs of infection will not order any imaging and continue to monitor for pain while on prednisone.  Plan - Continue prednisone 40mg  daily PRN   #6 CKD stage 4, GFR 28 (HCC) Stable. AM BMP showed evidence of Na 134. Cr increased to 1.81, will monitor if it improves after d/c Lasix. GFR decreased from Plan - F/U BMP  #7 Resolved: Acute GI bleeding. Hg improved. Continue Protonix 40 mg daily, F/U daily CBC     Chronic conditions: #COPD: Continue breo ellipta and incruse  #Chronic Back Pain: Continue to hold NSAIDs indefinitely #Postherpetic Neuralgia: Increase dose of gabapentin     Resolved Problems:   Hypotension   Acute renal failure (HCC)   Anemia   ABLA (acute blood loss anemia)   Acute delirium   Aspiration pneumonia (HCC)   VTE prophylaxis:  Diet: Regular diet IVF: IV Lasix Code: Full AB: Zosyn PT/OT recommendations: N/A Family Contact: Pidgeon,Monica , to be notified.    This is a Psychologist, occupational Note.  Rosana Fret, Medical Student discussed the care of the patient with Dr. Lafonda Mosses  and the assessment and plan formulated with their assistance.  Please see their attached note for official documentation of the daily encounter.

## 2023-09-01 NOTE — TOC Progression Note (Signed)
 Transition of Care [facility]) - Progression Note    Patient Details  Name: Kim Anthony [name]: [id number] Date of Birth: 06/27/1950  Transition of Care [facility]) CM/SW Contact  Reva Bores, LCSWA Phone Number: 09/01/2023, 9:21 AM  Clinical Narrative:   HF CSW followed up on patients insurance auth and she was approved.   Auth id: [id number], good for 09/01/22 - 09/03/23   CSW spoke with Bjorn Loser at [facility] who confirmed bed availability at [facility]. Someone will come see pt today.   TOC will continue following.        Barriers to Discharge: Continued Medical Work up  Expected Discharge Plan and Services       Living arrangements for the past 2 months: Single Family Home                                       Social Determinants of Health (SDOH) Interventions SDOH Screenings   Food Insecurity: No Food Insecurity (08/18/2023)  Housing: Low Risk  (08/18/2023)  Transportation Needs: No Transportation Needs (08/18/2023)  Utilities: Not At Risk (08/18/2023)  Alcohol Screen: Low Risk  (12/01/2022)  Depression (PHQ2-9): Low Risk  (12/01/2022)  Financial Resource Strain: Low Risk  (12/01/2022)  Physical Activity: Inactive (12/01/2022)  Social Connections: Patient Declined (08/18/2023)  Stress: Stress Concern Present (12/01/2022)  Tobacco Use: High Risk (08/24/2023)    Readmission Risk Interventions    02/19/2022    3:36 PM  Readmission Risk Prevention Plan  Transportation Screening Complete  PCP or Specialist Appt within 3-5 Days Complete  HRI or Home Care Consult Complete  Social Work Consult for Recovery Care Planning/Counseling Complete  Palliative Care Screening Not Applicable  Medication Review Oceanographer) Complete

## 2023-09-02 DIAGNOSIS — I82409 Acute embolism and thrombosis of unspecified deep veins of unspecified lower extremity: Secondary | ICD-10-CM | POA: Insufficient documentation

## 2023-09-02 DIAGNOSIS — M10031 Idiopathic gout, right wrist: Secondary | ICD-10-CM

## 2023-09-02 HISTORY — DX: Acute embolism and thrombosis of unspecified deep veins of unspecified lower extremity: I82.409

## 2023-09-02 LAB — PROTIME-INR
INR: 2.3 — ABNORMAL HIGH (ref 0.8–1.2)
Prothrombin Time: 25.2 s — ABNORMAL HIGH (ref 11.4–15.2)

## 2023-09-02 LAB — CBC
HCT: 27.6 % — ABNORMAL LOW (ref 36.0–46.0)
Hemoglobin: 8.8 g/dL — ABNORMAL LOW (ref 12.0–15.0)
MCH: 28 pg (ref 26.0–34.0)
MCHC: 31.9 g/dL (ref 30.0–36.0)
MCV: 87.9 fL (ref 80.0–100.0)
Platelets: 826 10*3/uL — ABNORMAL HIGH (ref 150–400)
RBC: 3.14 MIL/uL — ABNORMAL LOW (ref 3.87–5.11)
RDW: 19.6 % — ABNORMAL HIGH (ref 11.5–15.5)
WBC: 18.7 10*3/uL — ABNORMAL HIGH (ref 4.0–10.5)
nRBC: 0 % (ref 0.0–0.2)

## 2023-09-02 LAB — GLUCOSE, CAPILLARY
Glucose-Capillary: 113 mg/dL — ABNORMAL HIGH (ref 70–99)
Glucose-Capillary: 147 mg/dL — ABNORMAL HIGH (ref 70–99)
Glucose-Capillary: 159 mg/dL — ABNORMAL HIGH (ref 70–99)
Glucose-Capillary: 276 mg/dL — ABNORMAL HIGH (ref 70–99)
Glucose-Capillary: 89 mg/dL (ref 70–99)
Glucose-Capillary: 93 mg/dL (ref 70–99)

## 2023-09-02 LAB — BASIC METABOLIC PANEL
Anion gap: 13 (ref 5–15)
BUN: 51 mg/dL — ABNORMAL HIGH (ref 8–23)
CO2: 25 mmol/L (ref 22–32)
Calcium: 8.6 mg/dL — ABNORMAL LOW (ref 8.9–10.3)
Chloride: 91 mmol/L — ABNORMAL LOW (ref 98–111)
Creatinine, Ser: 1.92 mg/dL — ABNORMAL HIGH (ref 0.44–1.00)
GFR, Estimated: 27 mL/min — ABNORMAL LOW (ref >60)
Glucose, Bld: 87 mg/dL (ref 70–99)
Potassium: 3.8 mmol/L (ref 3.5–5.1)
Sodium: 129 mmol/L — ABNORMAL LOW (ref 135–145)

## 2023-09-02 LAB — COOXEMETRY PANEL
Carboxyhemoglobin: 2 % — ABNORMAL HIGH (ref 0.5–1.5)
Carboxyhemoglobin: 1.4 % (ref 0.5–1.5)
Methemoglobin: <0.7 % (ref 0.0–1.5)
Methemoglobin: <0.7 % (ref 0.0–1.5)
O2 Saturation: 67.7 %
O2 Saturation: 59.9 %
Total hemoglobin: 9.1 g/dL — ABNORMAL LOW (ref 12.0–16.0)
Total hemoglobin: 9.4 g/dL — ABNORMAL LOW (ref 12.0–16.0)

## 2023-09-02 MED ORDER — POTASSIUM CHLORIDE CRYS ER 20 MEQ PO TBCR
20.0000 meq | EXTENDED_RELEASE_TABLET | Freq: Once | ORAL | Status: AC
  Administered 2023-09-02: 20 meq via ORAL
  Filled 2023-09-02: qty 1

## 2023-09-02 MED ORDER — TORSEMIDE 20 MG PO TABS: 60.0000 mg | ORAL_TABLET | Freq: Once | ORAL | Status: DC

## 2023-09-02 MED ORDER — WARFARIN SODIUM 5 MG PO TABS
5.0000 mg | ORAL_TABLET | Freq: Once | ORAL | Status: AC
  Administered 2023-09-02: 5 mg via ORAL
  Filled 2023-09-02: qty 1

## 2023-09-02 MED ORDER — POTASSIUM CHLORIDE CRYS ER 20 MEQ PO TBCR
40.0000 meq | EXTENDED_RELEASE_TABLET | Freq: Once | ORAL | Status: AC
  Administered 2023-09-02: 20 meq via ORAL
  Filled 2023-09-02: qty 2

## 2023-09-02 MED ORDER — TORSEMIDE 20 MG PO TABS
80.0000 mg | ORAL_TABLET | Freq: Once | ORAL | Status: AC
  Administered 2023-09-02: 80 mg via ORAL
  Filled 2023-09-02: qty 4

## 2023-09-02 MED ORDER — PREDNISONE 20 MG PO TABS
40.0000 mg | ORAL_TABLET | Freq: Every day | ORAL | Status: AC
  Administered 2023-09-02 – 2023-09-04 (×3): 40 mg via ORAL
  Filled 2023-09-02 (×3): qty 2

## 2023-09-02 NOTE — TOC Progression Note (Signed)
 Transition of Care [facility]) - Progression Note    Patient Details  Name: Kim Anthony [name]: [id number] Date of Birth: [birth year]  Transition of Care [facility]) CM/SW Contact  Nicanor Bake Phone Number: [phone] 09/02/2023, 10:14 AM  Clinical Narrative:   10:03 AM- HF CSW called and spoke with pts daughter, [name] over the phone. CSW inquired with pts daughter about dc plan options. Pts daughter stated that after speaking to the pt they were wanting to lean amore about the [facility] and HHPT cost and process. CSW explained that the HF NCM would be most appropriate to speak with about that option, but explained that PT is saying that she needs care 24hrs a day. Pts daughter stated that with work and school schedules that is not something that can be provided at this time. CSW explained that PT stated that is seems that SNF appears most appropriate so the pt can focus on rehab and getting stronger. Pts daughter stated that they are still wanting [facility] SNF.   TOC will continue following.       Barriers to Discharge: Continued Medical Work up  Expected Discharge Plan and Services       Living arrangements for the past 2 months: Single Family Home                                       Social Determinants of Health (SDOH) Interventions SDOH Screenings   Food Insecurity: No Food Insecurity (08/18/2023)  Housing: Low Risk  (08/18/2023)  Transportation Needs: No Transportation Needs (08/18/2023)  Utilities: Not At Risk (08/18/2023)  Alcohol Screen: Low Risk  (12/01/2022)  Depression (PHQ2-9): Low Risk  (12/01/2022)  Financial Resource Strain: Low Risk  (12/01/2022)  Physical Activity: Inactive (12/01/2022)  Social Connections: Patient Declined (08/18/2023)  Stress: Stress Concern Present (12/01/2022)  Tobacco Use: High Risk (08/24/2023)    Readmission Risk Interventions    02/19/2022    3:36 PM  Readmission Risk Prevention Plan  Transportation Screening Complete  PCP or  Specialist Appt within 3-5 Days Complete  HRI or Home Care Consult Complete  Social Work Consult for Recovery Care Planning/Counseling Complete  Palliative Care Screening Not Applicable  Medication Review Oceanographer) Complete

## 2023-09-02 NOTE — Progress Notes (Signed)
 Mobility Specialist Progress Note;   09/02/23 1022  Mobility  Activity Transferred from bed to chair;Transferred to/from [facility]  Level of Assistance Standby assist, set-up cues, supervision of patient - no hands on  Assistive Device Front wheel walker  Distance Ambulated (ft) 7 ft  Activity Response Tolerated well  Mobility Referral Yes  Mobility visit 1 Mobility  Mobility Specialist Start Time (ACUTE ONLY) 1022  Mobility Specialist Stop Time (ACUTE ONLY) 1051  Mobility Specialist Time Calculation (min) (ACUTE ONLY) 29 min   Pt agreeable to mobility. Pt stated she had a BM while in bed requiring pericare from MS. Requested assistance to [facility], BM successful. Required no physical assistance to transfer pt from bed to [facility] to chair, SV. VSS throughout. Pt left in chair with all needs met, alarm on.   Caesar Bookman Mobility Specialist Please contact via SecureChat or [employer] [phone]

## 2023-09-02 NOTE — Progress Notes (Signed)
 Interval history Subjective: Kim Anthony complains of new onset diarrhea. Her cough is baseline. She would not like to be placed in a home health facility and instead requests nurse aid at home. She lives with her daughter [name].   Overnight events: In the setting of her diarrhea, colchicine was discontinued. Cr has increased to 1.92. Her prednisone was restarted for the next 3 days to control her gout flare. CVP is stable. Co-ox is stable. Pt was seen by a mobility specialist today who stated the Pt is agreeable to mobility. PT stated she had a BM while in bed requiring pericare from MS.    Objective:  Blood pressure 102/85, pulse 79, temperature 98 F (36.7 C), temperature source Oral, resp. rate 19, height 5\' 5"  (1.651 m), weight 69.2 kg, SpO2 94%. Physical exam Constitutional:      Appearance: Normal appearance.    Comments: Laying in bed Cardiovascular:     Comments: S3 sound noted on auscultation. No other murmurs appreciated. No LE edema Pulmonary:     Effort: Pulmonary effort is normal.     Comments: Bilateral crackles appreciated at lung bases. Skin:    General: Skin is warm and dry.  Neurological:     Mental Status: She is alert.  MSK: Not braced. No effusion noted at the right wrist. Monoarthopathy noted on R wrist.   Weight change: -1 kg   Intake/Output Summary (Last 24 hours) at 09/02/2023 1423 Last data filed at 09/01/2023 1543 Gross per 24 hour  Intake 154.96 ml  Output --  Net 154.96 ml   Net IO Since Admission: -11,172.64 mL [09/02/23 1423]   Labs, images, and other studies CVP 13  Co-ox 60% Cr 1.92 Na 129 K+ 3.8 GFR 27 INR 2.3 Hg 8.8  Assessment and plan Hospital day 15  Kim Anthony is a 73 y.o.  with a PMH of biventricular HF, pulm HTN, gout, COPD, paroxysmal afib,AKI, CKD who was admitted for acute upper GI bleed 2/2 gastritis with melena and hypotension.     Active Problems: Biventricular heart failure with reduced left  ventricular function (HCC) RV failure  HFimpEF Pulmonary hypertension, low resistance (HCC) Stable. Will consider starting oral torsemide 100mg  after monitoring Cr and GFR. Cardiology is considering dropping milrinone based on her improving Coox and will wait for their recommendations. CVP is stable.  Plan: Continue Milrinone F/U BMP Repeat Co-ox studies   CKD stage 4, GFR 45-59 ml/min (HCC) AKI (acute kidney injury) (HCC) Her Cr and GFR are concerning for worsening AKI in the setting of diarrhea likely 2/2 colchicine. As she did not look volume overloaded on exam and her LE were warm, will hold on the torsemide until her labs are more stable as above.   Plan: F/U BMP   S/P mitral valve replacement Warfarin anticoagulation DVT (deep venous thrombosis) (HCC) Patient is chronically stable on Warfarin. INR stable at 2.3. Pharmacy continuing to monitor as well.   Plan:  Continue Warfarin 4mg  daily F/U INR   F/U CBC  Atrial fibrillation with RVR (HCC) QT prolongation Stable. Remains in NSR on tele.   Plan: Continue IV amiodarone with milrinone  R wrist pain 2/2 to gout  Her acute gout flare is not improving on colchicine, will d/c and need to restart prednisone to treat her gout flares for the time being.  Plan  Start Prednisone 40mg  D/C colchicine  COPD (chronic obstructive pulmonary disease) (  HCC) Her O2 sat remains in above 90, Will maintain that it does not rise above 98%. Since she is not in acute respiratory failure will not need to worry about keeping O2 88-92%.  Plan:  Continue breo ellipta and incruse   Chronic conditions: #Chronic Back Pain: Continue to hold NSAIDs indefinitely #Postherpetic Neuralgia: Continue gabapentin   Resolved Problems:   Hypotension   Acute renal failure (HCC)   Anemia   Acute GI bleeding   ABLA (acute blood loss anemia)   Acute delirium   Aspiration pneumonia (HCC)   AKI (acute kidney injury) (HCC)   VTE prophylaxis:  Warfarin 4mg  Diet: Regular diet IVF: None Code: Full AB: None PT/OT recommendations:  Fall precautions and O2 restrictions If plan is discharge home, recommend the following: Assistance with cooking/housework;Assist for transportation;Help with stairs or ramp for entrance;A little help with walking and/or transfers   Family Contact: Pitcher,Monica , to be notified.   Attestation for Student Documentation:  I personally was present and re-performed the history, physical exam and medical decision-making activities of this service and have verified that the service and findings are accurately documented in the student's note.  Kim Anthony is a 73 y.o. with a pertinent PMH of biventricular heart failure, mitral valve replacement c/b valvular A-fib on warfarin, CAD and gout, who presented with hypotension and melena and admitted for upper GI bleed.It is unfortunate she has poor reserve at this time and we are trying to get here more stabilized with diuresing and inotrope. Her CVP and Co-ox is slowing improving and I agree with cardiology starting torsemide today while keeping an eye on her Scr. PT/OT will continue to work with patient to assess home needs as she wishes to return home vs SNF. Cardiology recs appreciated   Kathleen Lime, MD 09/02/2023, 3:54 PM

## 2023-09-02 NOTE — Progress Notes (Signed)
 PHARMACY - ANTICOAGULATION CONSULT NOTE  Pharmacy Consult for heparin> enoxaparin  > warfarin  Indication:  Mechanical mitral valve replacement  Allergies  Allergen Reactions   Lisinopril Swelling   Acetaminophen-Codeine Itching   Propoxyphene Itching    Darvocet   Tape Rash    Plastic   Tramadol Itching, Nausea And Vomiting and Rash    Patient Measurements: Height: 5\' 5"  (165.1 cm) Weight: 69.2 kg (152 lb 8.9 oz) IBW/kg (Calculated) : 57 Heparin Dosing Weight: TBW  Vital Signs: Temp: 98 F (36.7 C) (03/13 1125) Temp Source: Oral (03/13 1125) BP: 102/85 (03/13 1125) Pulse Rate: 79 (03/13 1125)  Labs: Recent Labs    08/31/23 0417 09/01/23 0523 09/01/23 0757 09/02/23 0643  HGB 8.0* 8.7*  --  8.8*  HCT 25.2* 26.9*  --  27.6*  PLT 617* 740*  --  826*  LABPROT 36.0* 25.8*  --  25.2*  INR 3.6* 2.3*  --  2.3*  CREATININE 1.41*  --  1.88* 1.92*    Estimated Creatinine Clearance: 25.5 mL/min (A) (by C-G formula based on SCr of 1.92 mg/dL (H)).   Medical History: Past Medical History:  Diagnosis Date   Acute delirium 08/30/2023   Acute GI bleeding 08/18/2023   Arthritis    Asthma    Breast cancer (HCC) 2001   Left Breast Cancer   CHF (congestive heart failure) (HCC)    COPD (chronic obstructive pulmonary disease) (HCC)    Coronary artery disease    GERD (gastroesophageal reflux disease)    History of mitral valve replacement with mechanical valve    HLD (hyperlipidemia)    Hypertension    Pulmonary embolism (HCC) 2021     Assessment: 2 yoM with hx AFib and mMVR on warfarin (goal INR 2.5-3.5). Warfarin held on admit with melenic stools, heparin started for anticoagulation. EGD 3/4 with no bleeding, heparin and warfarin resumed postop.  INR back down today 3.6 > 2.3>2.3. Pt likely will need reduced dosing going forward with new amio and ongoing congestion.  Also drinking ensures, although pt says she's stopping due to diarrhea.  Home dose = 5mg  daily  except 7.5mg  Mon/Thurs (5mg  daily per pt report)  Goal of Therapy:  INR goal 2.5-3.5 Monitor platelets by anticoagulation protocol: Yes  Plan:  Warfarin 5 mg x1 tonight  Daily INR, CBC   Reece Leader, Colon Flattery, [facility] Clinical Pharmacist  09/02/2023 12:28 PM   [facility] pharmacy phone numbers are listed on [email or web address]

## 2023-09-02 NOTE — Progress Notes (Addendum)
 Advanced Heart Failure Rounding Note  Cardiologist: Arvilla Meres, MD   Chief Complaint: RV Failure  Subjective:    Remains on milrinone 0.125 mcg. Co-ox 60%.   Diuretics remain on hold. Lasix gtt discontinued yesterday. SCr continues to trend up, 1.41>>1.88>>1.92 today. BUN 37>>51. Wt up 2 lb from yesterday, CVP 9-10.   Also w/ diarrhea x 2 days, she thinks 2/2 drinking Ensure. Also getting scheduled bowl regimen.   Na 129 K 3.8   WBC also elevated at 18K but in setting of recent prednisone for gout. Afebrile.   Still w/ rt wrist pain. Denies dyspnea.    Objective:    Weight Range: 69.2 kg Body mass index is 25.39 kg/m.   Vital Signs:   Temp:  [97.7 F (36.5 C)-98.7 F (37.1 C)] 98.3 F (36.8 C) (03/12 2320) Pulse Rate:  [79-85] 85 (03/12 2320) Resp:  [18-20] 20 (03/12 2320) BP: (113-130)/(65-111) 120/65 (03/12 2320) SpO2:  [90 %-95 %] 94 % (03/12 2320) FiO2 (%):  [28 %] 28 % (03/12 0813) Weight:  [69.2 kg] 69.2 kg (03/13 0430) Last BM Date : 09/01/23  Weight change: Filed Weights   08/31/23 0929 09/01/23 0519 09/02/23 0430  Weight: 70.2 kg 68.3 kg 69.2 kg   Intake/Output:   Intake/Output Summary (Last 24 hours) at 09/02/2023 0723 Last data filed at 09/01/2023 1543 Gross per 24 hour  Intake 394.96 ml  Output 600 ml  Net -205.04 ml      Physical Exam   CVP 9-10 General:  fatigued appearing. No respiratory difficulty HEENT: normal Neck: supple. JVD 10 cm, CV waves to jaw. Carotids 2+ bilat; no bruits. No lymphadenopathy or thyromegaly appreciated. Cor: PMI nondisplaced. Regular rate & rhythm. 2/6 TR murmur  Lungs: clear Abdomen: soft, nontender, nondistended. No hepatosplenomegaly. No bruits or masses. Good bowel sounds. Extremities: no cyanosis, clubbing, rash, edema + RUE PICC  Neuro: alert & oriented x 3, cranial nerves grossly intact. moves all 4 extremities w/o difficulty. Affect pleasant.  Telemetry   NSR 80s, personally reviewed    EKG    N/A  Labs    CBC Recent Labs    09/01/23 0523 09/02/23 0643  WBC 17.7* 18.7*  HGB 8.7* 8.8*  HCT 26.9* 27.6*  MCV 87.3 87.9  PLT 740* 826*   Basic Metabolic Panel Recent Labs    98/11/91 0417 09/01/23 0757  NA 134* 129*  K 4.1 3.3*  CL 98 90*  CO2 23 28  GLUCOSE 130* 171*  BUN 24* 37*  CREATININE 1.41* 1.88*  CALCIUM 8.6* 9.0    BNP (last 3 results) Recent Labs    08/26/23 0404 08/27/23 0458 08/28/23 0414  BNP 375.3* 598.1* 589.0*   Other results:  Medications:    Scheduled Medications:  Chlorhexidine Gluconate Cloth  6 each Topical Daily   colchicine  0.6 mg Oral Daily   docusate sodium  100 mg Oral BID   feeding supplement  237 mL Oral BID BM   fluticasone furoate-vilanterol  1 puff Inhalation Daily   And   umeclidinium bromide  1 puff Inhalation Daily   gabapentin  100 mg Oral TID   insulin aspart  0-15 Units Subcutaneous Q4H   pantoprazole  40 mg Oral Daily   polyethylene glycol  17 g Oral BID   QUEtiapine  25 mg Oral QHS   sennosides  5 mL Oral BID   Warfarin - Pharmacist Dosing Inpatient   Does not apply q1600    Infusions:  amiodarone 30  mg/hr (09/02/23 0506)   milrinone 0.125 mcg/kg/min (09/01/23 2231)    PRN Medications: acetaminophen, ondansetron (ZOFRAN) IV, mouth rinse, oxyCODONE  Patient Profile   Gloriajean Okun is a 73 y.o. female with a history of mechanical MVR in 2001 in [place], [place], CAD, hx breast cancer s/p left mastectomy and radiation/chemo, tobacco use, COPD, prior PE/DVT, HTN, undergoing workup for possible inflammatory arthritis with Rheumatology, and systolic CHF.   Assessment/Plan   1. Acute on chronic RV Failure HFimpEF Pulmonary HTN  - Suspect likely underlying OSA contributing to PH/RV failure. Also w/ known COPD. - Echo this admit EF 55% and D-shaped septum, RV-RA gradient 25 mmHg and severe TR (does not coapt) - NYHA III-IV on admit - on Milrinone 0.125, Co-ox 60% - CVP down 9-10. With RV failure  + AKI w/ continued rising SCr in the setting of ongoing diarrhea, will continue to hold diuretics today  - follow BMP    2. AKI - in the setting of RV congestion/low output, now confounded by ongoing diarrhea  - SCr baseline  0.8. ---> 1.9 today  - continue milrinone support, hold diuretics today  - follow SCr   3. PAF - She does not do well in AFL/AF. - Recent admit 2/25 with AFL, converted to NSR on amio, but then stopped d/t long QT - Warfarin has been restarted, INR 2.3  - NSR on telemetry. Continue IV amiodarone while on milrinone.    4. CAD  Elevated Troponin - 1 V CAD with occluded mid RCA on cath (7/22) - hsTrop 113>121>198. Likely demand ischemia, decongest and follow - No chest pain.  - continue statin  5. GI bleed Anemia  - hypotension with melena requiring RBCs - EGD 3/04 with gastritis, no other significant findings - Hgb stable.     6. Hx of mechanical mitral valve replacement: - Mechanical MVR in [place] in 2001. - Stable on echo 2/25 MV gradient 3 - INR 2.3 today. Continue Warfarin. Discussed with Pharm D. Will increase dose today  - Continue SBE prophylaxis.   7. Tricuspid valve regurgitation - severe on echo 2/25 - Follow    8. Right hand/wrist pain Hx gout - Takes prednisone and NSAIDs at home - Suspect acute flare. Uric acid was elevated at 7.7. Completed 2 rounds of prednisone  - continuing to have moderate pain despite colchicine 0.6mg  daily  - ? CT vs MRI to rule out osteomyelitis or abscess, defer to IM   9. Hyponatremia - Hypervolemic hyponatremia - 129 today, follow BMP   10. ID - ? Aspiration PNA.  - Completed antibiotics.   11. Hypokalemia - resolved, 3.8 today - monitor w/ diarrhea   12. Diarrhea - stop Ensure and stop scheduled bowl regimen - continue to monitor  - if continues, ? Colchicine. Also recently completed abx and has elevated WBC, though in the setting of recent steroids. Will defer c-diff screening to IM.     Length of Stay: [address] [place] 09/02/2023   Patient seen with PA, I formulated the plan and agree with the above note.   Weight up 2 lbs.  CVP about 13 this morning. Co-ox 60%.  Main complaint is still right wrist pain. Creatinine and BUN mildly higher today, 1.92 and 51.   General: NAD Neck: JVP 12-14 cm, no thyromegaly or thyroid nodule.  Lungs: Clear to auscultation bilaterally with normal respiratory effort. CV: Nondisplaced PMI.  Heart regular S1/S2, no S3/S4, no murmur.  1+ ankle edema.  Abdomen: Soft, nontender, no hepatosplenomegaly, no distention.  Skin: Intact without lesions or rashes.  Neurologic: Alert and oriented x 3.  Psych: Normal affect. Extremities: No clubbing or cyanosis.  HEENT: Normal.   Patient is volume overloaded with primarily RV failure likely from cor pulmonale (has h/o OHS/OSA and COPD).   Co-ox 60% on milrinone 0.125 for RV support. CVP 13 today, starting to rise.  Creatinine and BUN are mildly higher.  - Continue milrinone 0.125 for now, will aim to stop maybe tomorrow.  - I think she is going to need to start po diuretic at least with rising weight and CVP, will use a dose of torsemide 80 mg today and follow response.    INR 2.3 with mechanical mitral valve.  Continue coumadin for goal INR 2.5-3.5.    Continue amiodarone gtt while on milrinone given PAF.    Antibiotics completed for possible aspiration PNA.   Tenuous RV failure, continue to watch renal function carefully as we try to come up with a stable outpatient regimen.   Marca Ancona 09/02/2023 2:31 PM

## 2023-09-03 LAB — PROTIME-INR
INR: 2.6 — ABNORMAL HIGH (ref 0.8–1.2)
Prothrombin Time: 28.3 s — ABNORMAL HIGH (ref 11.4–15.2)

## 2023-09-03 LAB — BASIC METABOLIC PANEL
Anion gap: 12 (ref 5–15)
BUN: 64 mg/dL — ABNORMAL HIGH (ref 8–23)
CO2: 25 mmol/L (ref 22–32)
Calcium: 9.2 mg/dL (ref 8.9–10.3)
Chloride: 92 mmol/L — ABNORMAL LOW (ref 98–111)
Creatinine, Ser: 1.76 mg/dL — ABNORMAL HIGH (ref 0.44–1.00)
GFR, Estimated: 30 mL/min — ABNORMAL LOW (ref >60)
Glucose, Bld: 107 mg/dL — ABNORMAL HIGH (ref 70–99)
Potassium: 3.9 mmol/L (ref 3.5–5.1)
Sodium: 129 mmol/L — ABNORMAL LOW (ref 135–145)

## 2023-09-03 LAB — CBC
HCT: 27.2 % — ABNORMAL LOW (ref 36.0–46.0)
Hemoglobin: 8.5 g/dL — ABNORMAL LOW (ref 12.0–15.0)
MCH: 27.7 pg (ref 26.0–34.0)
MCHC: 31.3 g/dL (ref 30.0–36.0)
MCV: 88.6 fL (ref 80.0–100.0)
Platelets: 805 10*3/uL — ABNORMAL HIGH (ref 150–400)
RBC: 3.07 MIL/uL — ABNORMAL LOW (ref 3.87–5.11)
RDW: 19.2 % — ABNORMAL HIGH (ref 11.5–15.5)
WBC: 14.5 10*3/uL — ABNORMAL HIGH (ref 4.0–10.5)
nRBC: 0 % (ref 0.0–0.2)

## 2023-09-03 LAB — COOXEMETRY PANEL
Carboxyhemoglobin: 1.5 % (ref 0.5–1.5)
Methemoglobin: <0.7 % (ref 0.0–1.5)
O2 Saturation: 55.9 %
Total hemoglobin: 9.1 g/dL — ABNORMAL LOW (ref 12.0–16.0)

## 2023-09-03 LAB — GLUCOSE, CAPILLARY
Glucose-Capillary: 124 mg/dL — ABNORMAL HIGH (ref 70–99)
Glucose-Capillary: 77 mg/dL (ref 70–99)
Glucose-Capillary: 104 mg/dL — ABNORMAL HIGH (ref 70–99)
Glucose-Capillary: 156 mg/dL — ABNORMAL HIGH (ref 70–99)
Glucose-Capillary: 168 mg/dL — ABNORMAL HIGH (ref 70–99)
Glucose-Capillary: 93 mg/dL (ref 70–99)

## 2023-09-03 MED ORDER — POTASSIUM CHLORIDE CRYS ER 20 MEQ PO TBCR
40.0000 meq | EXTENDED_RELEASE_TABLET | Freq: Two times a day (BID) | ORAL | Status: DC
  Administered 2023-09-03 – 2023-09-05 (×6): 40 meq via ORAL
  Filled 2023-09-03 (×6): qty 2

## 2023-09-03 MED ORDER — DAPAGLIFLOZIN PROPANEDIOL 10 MG PO TABS
10.0000 mg | ORAL_TABLET | Freq: Every day | ORAL | Status: DC
  Administered 2023-09-03 – 2023-09-07 (×5): 10 mg via ORAL
  Filled 2023-09-03 (×5): qty 1

## 2023-09-03 MED ORDER — TORSEMIDE 20 MG PO TABS
100.0000 mg | ORAL_TABLET | Freq: Every day | ORAL | Status: DC
  Administered 2023-09-03 – 2023-09-05 (×3): 100 mg via ORAL
  Filled 2023-09-03 (×3): qty 5

## 2023-09-03 MED ORDER — WARFARIN SODIUM 4 MG PO TABS
4.0000 mg | ORAL_TABLET | Freq: Once | ORAL | Status: AC
  Administered 2023-09-03: 4 mg via ORAL
  Filled 2023-09-03: qty 1

## 2023-09-03 NOTE — Progress Notes (Signed)
 Physical Therapy Treatment Patient Details Name: Kim Anthony [name]: [id number] DOB: 1951-05-05 Today's Date: 09/03/2023   History of Present Illness Pt is 73 yo presenting to ED 2/25 with melena and symptomatic anemia in setting of GI bleed and chronic coumadin use. 2/28 R heart cath in setting of acute on chronic RV failure and pulmonary hypertension. 3/11 R internal jugular DVT. PMH: gout, CAD, HFrEF, a fib, HTN, GERD, HLD, COPD, hx of breast cancer s/p L mastectomy and radiation/chemo.    PT Comments  Pt up in chair on arrival and agreeable to session with good progress towards acute goals. Pt able to progress gait distance significantly this session with RW for support and CGA for safety. Pt O2 variable on Ra with activity dropping to mid 80s but rebounding quickly as high as 99% during activity with cues for PLB. Replaced 2L on return to room at end of session. Pt continues to be limited in safe mobility by decreased activity tolerance and cardiopulmonary endurance, impaired balance/postural reactions and general weakness. Pt continues to benefit from skilled PT services to progress toward functional mobility goals.     If plan is discharge home, recommend the following: Assistance with cooking/housework;Assist for transportation;Help with stairs or ramp for entrance;A little help with walking and/or transfers   Can travel by private vehicle     Yes  Equipment Recommendations  None recommended by PT    Recommendations for Other Services       Precautions / Restrictions Precautions Precautions: Fall Restrictions Weight Bearing Restrictions Per Provider Order: No     Mobility  Bed Mobility Overal bed mobility: Needs Assistance             General bed mobility comments: pt up in chair on arrival    Transfers Overall transfer level: Needs assistance Equipment used: Rolling walker (2 wheels) Transfers: Sit to/from Stand Sit to Stand: Contact guard assist            General transfer comment: CGA for safety, good recall for hand placement    Ambulation/Gait Ambulation/Gait assistance: Contact guard assist Gait Distance (Feet): 304 Feet Assistive device: Rolling walker (2 wheels) Gait Pattern/deviations: Step-through pattern, Decreased step length - right, Decreased step length - left, Shuffle, Trunk flexed Gait velocity: decreased     General Gait Details: short and steady steps, light cues for upright posture and closer RW proximity   Stairs             Wheelchair Mobility     Tilt Bed    Modified Rankin (Stroke Patients Only)       Balance Overall balance assessment: Needs assistance Sitting-balance support: Feet supported, No upper extremity supported Sitting balance-Leahy Scale: Good     Standing balance support: Single extremity supported, During functional activity, Reliant on assistive device for balance Standing balance-Leahy Scale: Poor Standing balance comment: reliant on RW support                            Communication Communication Communication: No apparent difficulties  Cognition Arousal: Alert Behavior During Therapy: WFL for tasks assessed/performed                             Following commands: Intact      Cueing Cueing Techniques: Verbal cues  Exercises      General Comments General comments (skin integrity, edema, etc.): VSS on supplemental O2 at rest,  desatting to 85% on RA with mobility, able to recover quickly throughout mobility to >95%      Pertinent Vitals/Pain Pain Assessment Pain Assessment: Faces Faces Pain Scale: Hurts little more Pain Location: B Feet Pain Descriptors / Indicators: Aching, Discomfort Pain Intervention(s): Monitored during session, Limited activity within patient's tolerance    Home Living Family/patient expects to be discharged to:: Private residence Living Arrangements: Children Available Help at Discharge: Family;Available  PRN/intermittently                    Prior Function            PT Goals (current goals can now be found in the care plan section) Acute Rehab PT Goals PT Goal Formulation: With patient Time For Goal Achievement: 09/08/23 Progress towards PT goals: Progressing toward goals    Frequency    Min 3X/week      PT Plan      Co-evaluation              AM-PAC PT "6 Clicks" Mobility   Outcome Measure  Help needed turning from your back to your side while in a flat bed without using bedrails?: None Help needed moving from lying on your back to sitting on the side of a flat bed without using bedrails?: A Little Help needed moving to and from a bed to a chair (including a wheelchair)?: A Little Help needed standing up from a chair using your arms (e.g., wheelchair or bedside chair)?: A Little Help needed to walk in hospital room?: A Little Help needed climbing 3-5 steps with a railing? : A Little 6 Click Score: 19    End of Session   Activity Tolerance: Patient tolerated treatment well Patient left: in chair;with call bell/phone within reach;with chair alarm set Nurse Communication: Mobility status PT Visit Diagnosis: Other abnormalities of gait and mobility (R26.89);Muscle weakness (generalized) (M62.81)     Time: 1012-1028 PT Time Calculation (min) (ACUTE ONLY): 16 min  Charges:    $Gait Training: 8-22 mins PT General Charges $$ ACUTE PT VISIT: 1 Visit                     Rosy Estabrook R. PTA Acute Rehabilitation Services Office: [phone]   Catalina Antigua 09/03/2023, 1:59 PM

## 2023-09-03 NOTE — Progress Notes (Signed)
 Hospital day 16 for her. PMH of biventricular HF, pulm HTN, gout, paroxysmal afib, AKI, CKD who was admitted for upper GI bleed.  Overnight events: Warfarin increased with 2.3 INR. Cardiology 80 mg torsemide was started for increased CVP and weight up 2 pounds.  Med changes: Pharmacy increased Warfarin increased from 4mg  to 5 mg with 2.3 INR.  BP:  107/57 Tmax: 98.4 P: 82 Resp, 15 SpO2 94 2L [place]  I/O:  240, output 400 Net - Weight up (162?)  Labs: CVP 11 Co-ox 55.9 Na 129  K+ 3.9 Cr 1.76, 1.92 GFR  30, 27 INR 2.6 Hg 8.5, 8.8  A/P  Summary statement:  73 y.o., PMH of biventricular HF, pulm HTN, gout, COPD, paroxysmal afib, AKI, CKD who was admitted for an acute upper GI bleed 2/2 gastritis with melena.   1. Biv HF. P: mil, BMP, Coox, increased torsemide 100mg   2. CKD/AKI P: BMP  3. MV replacement P: warfarin 5mg , INR, CBC  4. PAF P: IV amio w/ mil  5. R wrist pain P: prednisone 40  Q's for her:  HF: chest pain, breathing, cough Diarrhea qs, eating? improved, blood in stool, drinking ensure? Wrist, improved? Home health? PE: heart, lung, neck, wrist, leg, abd

## 2023-09-03 NOTE — Progress Notes (Addendum)
 Advanced Heart Failure Rounding Note  Cardiologist: Arvilla Meres, MD   Chief Complaint: RV Failure  Subjective:    Remains on milrinone 0.125 mcg. Co-ox 56%.   Torsemide resumed yesterday, w/ 1L in UOP. Doubt today's wt is accurate (charted as 10 lb gain). RN to recheck. CVP 14-15  SCr improving, 1.92>>1.76 today but BUN continues to rise, 37>>51>>64.  Na 129 K 3.9   INR 2.6   Diarrhea resolved after discontinuation of colchicine. Back on prednisone per IM. Pt reports wrist pain is finally starting to improve. WBCs downtrending.   Objective:    Weight Range: 73.6 kg Body mass index is 27 kg/m.   Vital Signs:   Temp:  [97.6 F (36.4 C)-98.4 F (36.9 C)] 97.9 F (36.6 C) (03/14 0342) Pulse Rate:  [79-89] 82 (03/13 1637) Resp:  [15-19] 15 (03/14 0342) BP: (93-132)/(60-86) 93/60 (03/14 0342) SpO2:  [94 %-96 %] 94 % (03/14 0342) Weight:  [73.6 kg] 73.6 kg (03/14 0500) Last BM Date : 09/02/23  Weight change: Filed Weights   09/01/23 0519 09/02/23 0430 09/03/23 0500  Weight: 68.3 kg 69.2 kg 73.6 kg   Intake/Output:   Intake/Output Summary (Last 24 hours) at 09/03/2023 0713 Last data filed at 09/02/2023 1638 Gross per 24 hour  Intake 240 ml  Output 400 ml  Net -160 ml      Physical Exam   CVP 14 PHYSICAL EXAM: General:  fatigued appearing. No respiratory difficulty HEENT: normal Neck: supple. JVD elevated to jaw. Carotids 2+ bilat; no bruits. No lymphadenopathy or thyromegaly appreciated. Cor: PMI nondisplaced. Regular rate & rhythm. 2/6 TR murmur Lungs: clear Abdomen: soft, nontender, nondistended. No hepatosplenomegaly. No bruits or masses. Good bowel sounds. Extremities: no cyanosis, clubbing, rash, edema + LUE PICC  Neuro: alert & oriented x 3, cranial nerves grossly intact. moves all 4 extremities w/o difficulty. Affect pleasant.   Telemetry   NSR 80s, personally reviewed   EKG    N/A  Labs    CBC Recent Labs    09/02/23 0643  09/03/23 0500  WBC 18.7* 14.5*  HGB 8.8* 8.5*  HCT 27.6* 27.2*  MCV 87.9 88.6  PLT 826* 805*   Basic Metabolic Panel Recent Labs    60/45/40 0643 09/03/23 0500  NA 129* 129*  K 3.8 3.9  CL 91* 92*  CO2 25 25  GLUCOSE 87 107*  BUN 51* 64*  CREATININE 1.92* 1.76*  CALCIUM 8.6* 9.2    BNP (last 3 results) Recent Labs    08/26/23 0404 08/27/23 0458 08/28/23 0414  BNP 375.3* 598.1* 589.0*   Other results:  Medications:    Scheduled Medications:  Chlorhexidine Gluconate Cloth  6 each Topical Daily   feeding supplement  237 mL Oral BID BM   fluticasone furoate-vilanterol  1 puff Inhalation Daily   And   umeclidinium bromide  1 puff Inhalation Daily   gabapentin  100 mg Oral TID   insulin aspart  0-15 Units Subcutaneous Q4H   pantoprazole  40 mg Oral Daily   predniSONE  40 mg Oral Daily   QUEtiapine  25 mg Oral QHS   Warfarin - Pharmacist Dosing Inpatient   Does not apply q1600    Infusions:  amiodarone 30 mg/hr (09/03/23 0454)   milrinone 0.125 mcg/kg/min (09/01/23 2231)    PRN Medications: acetaminophen, ondansetron (ZOFRAN) IV, mouth rinse, oxyCODONE  Patient Profile   Kim Anthony is a 73 y.o. female with a history of mechanical MVR in 2001 in [place],  [place], CAD, hx breast cancer s/p left mastectomy and radiation/chemo, tobacco use, COPD, prior PE/DVT, HTN, undergoing workup for possible inflammatory arthritis with Rheumatology, and systolic CHF.   Assessment/Plan   1. Acute on chronic RV Failure HFimpEF Pulmonary HTN  - Suspect likely underlying OSA contributing to PH/RV failure. Also w/ known COPD. - Echo this admit EF 55% and D-shaped septum, RV-RA gradient 25 mmHg and severe TR (does not coapt) - NYHA III-IV on admit - on Milrinone 0.125, Co-ox 56% - CVP 14-15 today, back on torsemide. Received 80 mg x 1 yesterday w/ 1L in UOP. SCr trending back down. Will increase torsemide back to home dose of 100 mg daily. May need once weekly metolazone going  forward.  - follow BMP    2. AKI - in the setting of RV congestion/low output - SCr baseline  0.8 - Scr starting to improve, down from 1.92>>1.76 today  - continue milrinone support + diuretics per above   - follow SCr   3. PAF - She does not do well in AFL/AF. - Recent admit 2/25 with AFL, converted to NSR on amio, but then stopped d/t long QT - Warfarin has been restarted, INR 2.6 - NSR on telemetry. Continue IV amiodarone while on milrinone.    4. CAD  Elevated Troponin - 1 V CAD with occluded mid RCA on cath (7/22) - hsTrop 113>121>198. Likely demand ischemia, decongest and follow - No chest pain.  - continue statin  5. GI bleed Anemia  - hypotension with melena requiring RBCs - EGD 3/04 with gastritis, no other significant findings - Hgb stable.     6. Hx of mechanical mitral valve replacement: - Mechanical MVR in [place] in 2001. - Stable on echo 2/25 MV gradient 3 - INR 2.6 today. Continue Warfarin. Discussed with Pharm D.  - Continue SBE prophylaxis.   7. Tricuspid valve regurgitation - severe on echo 2/25 - Follow    8. Right hand/wrist pain Hx gout - Takes prednisone and NSAIDs at home - Suspect acute flare. Uric acid was elevated at 7.7.  - now on 3rd round of prednisone, pain starting to improve - colchicine discontinued due to diarrhea   9. Hyponatremia - Hypervolemic hyponatremia - 129 today - continue diuretics per above   10. ID - ? Aspiration PNA.  - Completed antibiotics.   11. Hypokalemia - resolved,3.9 today  - give K supp w/ diuretics   12. Diarrhea - resolved w/ discontinuation of colchicine   Length of Stay: [address] Sharol Harness, PA-C 09/03/2023   Patient seen with PA, I formulated the plan and agree with the above note.   BUN higher today but creatinine down to 1.76.  I measured CVP around 12.  Co-ox 56%, she remains on milrinone 0.125.   No dyspnea.   General: NAD Neck: JVP 10 cm, no thyromegaly or thyroid nodule.   Lungs: Clear to auscultation bilaterally with normal respiratory effort. CV: Nondisplaced PMI.  Heart regular S1/S2, no S3/S4, no murmur.  Trace ankle edema.   Abdomen: Soft, nontender, no hepatosplenomegaly, no distention.  Skin: Intact without lesions or rashes.  Neurologic: Alert and oriented x 3.  Psych: Normal affect. Extremities: No clubbing or cyanosis.  HEENT: Normal.   Creatinine better though BUN still high.  She remains volume overloaded with CVP 12.  With RV failure, we will have to accept a mildly elevated CVP.  - Stop milrinone today.  - Increase torsemide back to 100 mg daily.  -  Restart Farxiga 10 mg daily.  - She will likely need once weekly metolazone at home.   INR 2.6 with mechanical mitral valve.  Continue coumadin for goal INR 2.5-3.5.    Stop amiodarone since we are stopping milrinone.  She has not been on amiodarone at home.  She is in NSR.   Marca Ancona 09/03/2023 1:03 PM

## 2023-09-03 NOTE — Progress Notes (Signed)
 Occupational Therapy Treatment Patient Details Name: Kim Anthony [name]: [id number] DOB: 1951-01-03 Today's Date: 09/03/2023   History of present illness Pt is 73 yo presenting to ED 2/25 with melena and symptomatic anemia in setting of GI bleed and chronic coumadin use. 2/28 R heart cath in setting of acute on chronic RV failure and pulmonary hypertension. 3/11 R internal jugular DVT. PMH: gout, CAD, HFrEF, a fib, HTN, GERD, HLD, COPD, hx of breast cancer s/p L mastectomy and radiation/chemo.   OT comments  Pt is making continued progress towards acute OT goals. Pt continues to be limited by decreased activity tolerance, pain, impaired balance, and generalized weakness. Overall, pt required up to [facility] for functional mobility using RW in room with verbal cues for B hand placement, sequencing, and safe management of RW during functional transfers. Up to CGA was also provided for clothing management and peri care while standing supported unilaterally at RW. Pt O2 decreased to ~87% on 2L O2 with activity but was able to quickly recover with RB and PLB. OT to continue following pt acutely to address functional needs with discharge recommendations of HHOT to maximize functional independence and to ensure safe discharge to home environment.       If plan is discharge home, recommend the following:  A little help with walking and/or transfers;A little help with bathing/dressing/bathroom;Assistance with cooking/housework   Equipment Recommendations  None recommended by OT    Recommendations for Other Services      Precautions / Restrictions Precautions Precautions: Fall Restrictions Weight Bearing Restrictions Per Provider Order: No       Mobility Bed Mobility Overal bed mobility: Needs Assistance             General bed mobility comments: NT. Pt received seated in recliner upon arrival    Transfers Overall transfer level: Needs assistance Equipment used: Rolling walker (2  wheels) Transfers: Sit to/from Stand Sit to Stand: Contact guard assist           General transfer comment: Pt provided with CGA for safety. Pt required verbal cues for B hand placement.     Balance Overall balance assessment: Needs assistance Sitting-balance support: Feet supported, No upper extremity supported Sitting balance-Leahy Scale: Good Sitting balance - Comments: Pt able to weight shift and perform functional bending/reaching to thread BLE through clothing. No LOB observed   Standing balance support: Single extremity supported, During functional activity, Reliant on assistive device for balance Standing balance-Leahy Scale: Poor Standing balance comment: Pt required support of atleast 1 UE to maintain balance while performing peri area bathing and clothing management                           ADL either performed or assessed with clinical judgement   ADL Overall ADL's : Needs assistance/impaired             Lower Body Bathing: Contact guard assist;Sit to/from stand;Cueing for safety       Lower Body Dressing: Minimal assistance;Sit to/from stand;Sitting/lateral leans   Toilet Transfer: Contact guard assist;Ambulation;Regular Toilet;Cueing for sequencing;Cueing for safety;Rolling walker (2 wheels)   Toileting- Clothing Manipulation and Hygiene: Contact guard assist;Sitting/lateral lean;Sit to/from stand       Functional mobility during ADLs: Contact guard assist;Rolling walker (2 wheels);Cueing for sequencing;Cueing for safety General ADL Comments: Pt provided with CGA for safety during functional mobility tasks. Verbal cues were provided for sequencing and B hand placement when performing STS and functional transfers  to commode, bed, and chair. Pt O2 decreased to ~87% on 2L O2 with activity but was able to quickly recover with RB and PLB.    Extremity/Trunk Assessment Upper Extremity Assessment Upper Extremity Assessment: Generalized weakness;RUE  deficits/detail RUE Deficits / Details: in prefab wrist cock up brace from home d/t gout flare per pt. RUE Coordination: decreased fine motor   Lower Extremity Assessment Lower Extremity Assessment: Defer to PT evaluation        Vision   Vision Assessment?: No apparent visual deficits   Perception Perception Perception: Not tested   Praxis Praxis Praxis: Not tested   Communication Communication Communication: No apparent difficulties   Cognition Arousal: Alert Behavior During Therapy: WFL for tasks assessed/performed Cognition: No apparent impairments             OT - Cognition Comments: Pt able to express/wants needs, communicated appropriately, followed verbal commands                 Following commands: Intact        Cueing   Cueing Techniques: Verbal cues  Exercises      Shoulder Instructions       General Comments VSS on supplemental O2 at rest, desatting to 85% on RA with mobility, able to recover quickly throughout mobility to >95%    Pertinent Vitals/ Pain       Pain Assessment Pain Assessment: Faces Faces Pain Scale: Hurts little more Pain Location: B Feet Pain Descriptors / Indicators: Aching, Discomfort Pain Intervention(s): Limited activity within patient's tolerance  Home Living Family/patient expects to be discharged to:: Private residence Living Arrangements: Children Available Help at Discharge: Family;Available PRN/intermittently                                    Prior Functioning/Environment              Frequency  Min 1X/week        Progress Toward Goals  OT Goals(current goals can now be found in the care plan section)  Progress towards OT goals: Progressing toward goals  Acute Rehab OT Goals Patient Stated Goal: use the restroom OT Goal Formulation: With patient Time For Goal Achievement: 09/06/23 Potential to Achieve Goals: Good ADL Goals Pt Will Perform Lower Body Bathing: sit to/from  stand;sitting/lateral leans;with min assist Pt Will Perform Lower Body Dressing: with min assist;sitting/lateral leans;sit to/from stand Pt Will Transfer to Toilet: with min assist;ambulating Pt Will Perform Toileting - Clothing Manipulation and hygiene: with min assist;sitting/lateral leans;sit to/from stand  Plan      Co-evaluation                 AM-PAC OT "6 Clicks" Daily Activity     Outcome Measure   Help from another person eating meals?: None Help from another person taking care of personal grooming?: A Little Help from another person toileting, which includes using toliet, bedpan, or urinal?: A Little Help from another person bathing (including washing, rinsing, drying)?: A Little Help from another person to put on and taking off regular upper body clothing?: A Little Help from another person to put on and taking off regular lower body clothing?: A Little 6 Click Score: 19    End of Session Equipment Utilized During Treatment: Gait belt;Rolling walker (2 wheels);Oxygen  OT Visit Diagnosis: Unsteadiness on feet (R26.81);Other abnormalities of gait and mobility (R26.89);Muscle weakness (generalized) (M62.81)   Activity Tolerance Patient  tolerated treatment well   Patient Left in chair;with call bell/phone within reach;with chair alarm set   Nurse Communication Mobility status        Time: 3664-4034 OT Time Calculation (min): 32 min  Charges: OT General Charges $OT Visit: 1 Visit OT Treatments $Self Care/Home Management : 23-37 mins  Lynnda Shields 09/03/2023, 2:11 PM

## 2023-09-03 NOTE — Progress Notes (Addendum)
 Interval history Subjective: Kim Anthony reports her diarrhea has resolved and she has been eating again, though she noticed some "spotting" coming from her vagina, which she noticed last night and this morning. Last menstural period was 50 or so and she notes previous episodes of vaginal bleeding in the past. She denies vaginal burning or itching.  She reports her right wrist pain is improving with increased mobility. She is more open to being in [facility] after speaking with her daughter.   Overnight events: Torsemide increased to 100mg  daily. Warfarin increased to 5mg . Cr: 1.92 => 1.76. CVP is 11. Co-ox 60% => 55.9.  Objective:  Blood pressure (!) 107/57, pulse 79, temperature 98 F (36.7 C), temperature source Oral, resp. rate 19, height 5\' 5"  (1.651 m), weight 69.9 kg, SpO2 94%.  Physical exam Constitutional:      Appearance: Normal appearance.    Comments: Sitting up in chair. JVD is elevated.  Cardiovascular:     Comments: S3 sound noted on auscultation. No other murmurs appreciated. No LE edema.cap refill >3. Strong pedal pulses and radial pulses. Pulmonary:     Effort: Pulmonary effort is normal.     Comments: Bilateral crackles appreciated at lung bases. Skin:    General: Skin is warm and dry. B/l LE warm. Neurological:     Mental Status: She is alert.  MSK: Not braced. No effusion noted at the right wrist. Arthropathy noted on R wrist.   Weight change: 4.4 kg    Intake/Output Summary (Last 24 hours) at 09/03/2023 1104 Last data filed at 09/03/2023 0800 Gross per 24 hour  Intake 480 ml  Output 1600 ml  Net -1120 ml   Net IO Since Admission: -12,292.64 mL [09/03/23 1104]   Labs, images, and other studies CVP 11 Co-ox 55.9 Na 129  K+ 3.9 Cr 1.76 GFR  30 INR 2.6 Hg 8     Assessment and plan Hospital day 16  Kim Anthony is a 73 y.o.  with a PMH of biventricular HF, pulm HTN, gout, COPD, paroxysmal afib,AKI, CKD who was admitted for acute  upper GI bleed 2/2 gastritis with melena and hypotension.     Active Problems: Biventricular heart failure with reduced left ventricular function (HCC) RV failure  HFimpEF Pulmonary hypertension, low resistance (HCC) Stable. With resolving diarrhea, elevated JVD, weight increase of 2lbs, decreased I/O's yesterday, and Cr and GFR improving we agree with increasing torsemide to 100mg  daily. Cardiology is considering dropping milrinone tomorrow based on her overall findings tomorrow. Co-ox and will wait for their recommendations. CVP is stable. Plan: Continue Milrinone F/U BMP Torsemide 100mg   Repeat Co-ox studies   CKD stage 4, GFR 45-59 ml/min (HCC) AKI (acute kidney injury) (HCC) Cr 1.76, GFR  30. This is improving. Will continue to monitor trends.  Plan: F/U BMP   S/P mitral valve replacement Warfarin anticoagulation DVT (deep venous thrombosis) (HCC) Patient is chronically stable on Warfarin. INR 2.6. Will continue to monitor with Pharmacy.  Plan:  Continue Warfarin 5mg  daily F/U INR   F/U CBC  Atrial fibrillation with RVR (HCC) QT prolongation Stable. Remains in NSR on tele.   Plan: Continue IV amiodarone 30 mg/hr  with milrinone 0.125 mcg/kg/min   R wrist pain 2/2 to gout  Her acute gout flare is not improving on colchicine, will d/c and need to restart prednisone to treat her gout flares for the time being. Plan  Continue Prednisone 40mg  daily   AUB The new spotting [name] mentioned today is concerning while she is on warfarin, though since that patient has reported no further incidents since this morning, mild spotting, and previous episodes of AUB, would like to continue to monitor this outpatient if it does not worsen. Plan: Continue to monitor for worsening.   COPD (chronic obstructive pulmonary disease) (HCC) Stable.  Plan:  Continue breo ellipta and incruse   Chronic conditions: #Chronic Back Pain: Continue to hold NSAIDs  indefinitely #Postherpetic Neuralgia: Continue gabapentin   Resolved Problems:   Hypotension   Acute renal failure (HCC)   Anemia   Acute GI bleeding   ABLA (acute blood loss anemia)   Acute delirium   Aspiration pneumonia (HCC)   AKI (acute kidney injury) (HCC)   VTE prophylaxis: Warfarin 5mg  Diet: Regular diet IVF: Torsemide 100mg  Code: Full AB: None  PT/OT recommendations:   Pt is progressing well towards goals. Pt states she will have 24/7 physical assist available at home from daughter, however, unclear if information is accurate.   If plan is discharge home, recommend the following: Assistance with cooking/housework;Assist for transportation;Help with stairs or ramp for entrance;A little help with walking and/or transfers   Family Contact: Hawes,Monica , to be notified.   Attestation for Student Documentation:  I personally was present and re-performed the history, physical exam and medical decision-making activities of this service and have verified that the service and findings are accurately documented in the student's note.  Kim Anthony is a 73 y.o. with a pertinent PMH of biventricular heart failure, mitral valve replacement c/b valvular A-fib on warfarin, CAD and gout, who presented with hypotension and melena and admitted for upper GI bleed. PT/OT will continue to work wit patient to assess discharge needs. She is more amenable to going to a rehab today. For her Chronic RV failure, Increasing her torsemide to 100 mg daily and starting Farxiga 10 mg daily.   Kim Lime, MD 09/03/2023, 2:41 PM    Kim Anthony, Medical Student 09/03/2023, 11:04 AM

## 2023-09-03 NOTE — Progress Notes (Signed)
 PHARMACY - ANTICOAGULATION CONSULT NOTE  Pharmacy Consult for heparin> enoxaparin  > warfarin  Indication:  Mechanical mitral valve replacement  Allergies  Allergen Reactions   Lisinopril Swelling   Acetaminophen-Codeine Itching   Propoxyphene Itching    Darvocet   Tape Rash    Plastic   Tramadol Itching, Nausea And Vomiting and Rash    Patient Measurements: Height: 5\' 5"  (165.1 cm) Weight: 73.6 kg (162 lb 4.1 oz) IBW/kg (Calculated) : 57 Heparin Dosing Weight: TBW  Vital Signs: Temp: 98 F (36.7 C) (03/14 0717) Temp Source: Oral (03/14 0717) BP: 107/57 (03/14 0717) Pulse Rate: 79 (03/14 0717)  Labs: Recent Labs    09/01/23 0523 09/01/23 0757 09/02/23 0643 09/03/23 0500  HGB 8.7*  --  8.8* 8.5*  HCT 26.9*  --  27.6* 27.2*  PLT 740*  --  826* 805*  LABPROT 25.8*  --  25.2* 28.3*  INR 2.3*  --  2.3* 2.6*  CREATININE  --  1.88* 1.92* 1.76*    Estimated Creatinine Clearance: 28.6 mL/min (A) (by C-G formula based on SCr of 1.76 mg/dL (H)).   Medical History: Past Medical History:  Diagnosis Date   Acute delirium 08/30/2023   Acute GI bleeding 08/18/2023   Arthritis    Asthma    Breast cancer (HCC) 2001   Left Breast Cancer   CHF (congestive heart failure) (HCC)    COPD (chronic obstructive pulmonary disease) (HCC)    Coronary artery disease    GERD (gastroesophageal reflux disease)    History of mitral valve replacement with mechanical valve    HLD (hyperlipidemia)    Hypertension    Pulmonary embolism (HCC) 2021     Assessment: 60 yoM with hx AFib and mMVR on warfarin (goal INR 2.5-3.5). Warfarin held on admit with melenic stools, heparin started for anticoagulation. EGD 3/4 with no bleeding, heparin and warfarin resumed postop.  INR within goal range today at 2.6.  Pt may need reduced dosing going forward with new amio and ongoing congestion.  Also drinking ensures, although pt says she's stopping due to diarrhea.  Home dose = 5mg  daily except  7.5mg  Mon/Thurs (5mg  daily per pt report)  Goal of Therapy:  INR goal 2.5-3.5 Monitor platelets by anticoagulation protocol: Yes  Plan:  Warfarin 4 mg x1 tonight  Daily INR, CBC   Reece Leader, Colon Flattery, [facility] Clinical Pharmacist  09/03/2023 7:48 AM   [facility] pharmacy phone numbers are listed on [email or web address]

## 2023-09-03 NOTE — Progress Notes (Signed)
 Mobility Specialist Progress Note;   09/03/23 0912  Mobility  Activity Transferred from bed to chair  Level of Assistance Standby assist, set-up cues, supervision of patient - no hands on  Assistive Device Front wheel walker  Distance Ambulated (ft) 3 ft  Activity Response Tolerated well  Mobility Referral Yes  Mobility visit 1 Mobility  Mobility Specialist Start Time (ACUTE ONLY) 0912  Mobility Specialist Stop Time (ACUTE ONLY) [id number]  Mobility Specialist Time Calculation (min) (ACUTE ONLY) 12 min   Pt agreeable to mobility. Required no physical assistance during ambulation, SV. VSS throughout. Pt left in chair with all needs met, alarm on.   Caesar Bookman Mobility Specialist Please contact via SecureChat or [employer] [phone]

## 2023-09-04 LAB — CBC
HCT: 27.4 % — ABNORMAL LOW (ref 36.0–46.0)
Hemoglobin: 8.5 g/dL — ABNORMAL LOW (ref 12.0–15.0)
MCH: 27.5 pg (ref 26.0–34.0)
MCHC: 31 g/dL (ref 30.0–36.0)
MCV: 88.7 fL (ref 80.0–100.0)
Platelets: 794 10*3/uL — ABNORMAL HIGH (ref 150–400)
RBC: 3.09 MIL/uL — ABNORMAL LOW (ref 3.87–5.11)
RDW: 18.7 % — ABNORMAL HIGH (ref 11.5–15.5)
WBC: 16.9 10*3/uL — ABNORMAL HIGH (ref 4.0–10.5)
nRBC: 0 % (ref 0.0–0.2)

## 2023-09-04 LAB — BASIC METABOLIC PANEL
Anion gap: 9 (ref 5–15)
BUN: 77 mg/dL — ABNORMAL HIGH (ref 8–23)
CO2: 26 mmol/L (ref 22–32)
Calcium: 8.6 mg/dL — ABNORMAL LOW (ref 8.9–10.3)
Chloride: 92 mmol/L — ABNORMAL LOW (ref 98–111)
Creatinine, Ser: 1.92 mg/dL — ABNORMAL HIGH (ref 0.44–1.00)
GFR, Estimated: 27 mL/min — ABNORMAL LOW (ref >60)
Glucose, Bld: 89 mg/dL (ref 70–99)
Potassium: 3.9 mmol/L (ref 3.5–5.1)
Sodium: 127 mmol/L — ABNORMAL LOW (ref 135–145)

## 2023-09-04 LAB — GLUCOSE, CAPILLARY
Glucose-Capillary: 100 mg/dL — ABNORMAL HIGH (ref 70–99)
Glucose-Capillary: 102 mg/dL — ABNORMAL HIGH (ref 70–99)
Glucose-Capillary: 106 mg/dL — ABNORMAL HIGH (ref 70–99)
Glucose-Capillary: 141 mg/dL — ABNORMAL HIGH (ref 70–99)
Glucose-Capillary: 202 mg/dL — ABNORMAL HIGH (ref 70–99)
Glucose-Capillary: 253 mg/dL — ABNORMAL HIGH (ref 70–99)
Glucose-Capillary: 93 mg/dL (ref 70–99)

## 2023-09-04 LAB — PROTIME-INR
INR: 3.1 — ABNORMAL HIGH (ref 0.8–1.2)
Prothrombin Time: 32.1 s — ABNORMAL HIGH (ref 11.4–15.2)

## 2023-09-04 LAB — COOXEMETRY PANEL
Carboxyhemoglobin: 1.4 % (ref 0.5–1.5)
Methemoglobin: <0.7 % (ref 0.0–1.5)
O2 Saturation: 50.8 %
Total hemoglobin: 9.1 g/dL — ABNORMAL LOW (ref 12.0–16.0)

## 2023-09-04 MED ORDER — DIPHENHYDRAMINE-ZINC ACETATE 2-0.1 % EX CREA
TOPICAL_CREAM | Freq: Every day | CUTANEOUS | Status: DC | PRN
  Administered 2023-09-04: 1 via TOPICAL
  Filled 2023-09-04: qty 28

## 2023-09-04 MED ORDER — WARFARIN SODIUM 4 MG PO TABS
4.0000 mg | ORAL_TABLET | Freq: Once | ORAL | Status: AC
  Administered 2023-09-04: 4 mg via ORAL
  Filled 2023-09-04: qty 1

## 2023-09-04 MED ORDER — FLUOCINONIDE EMULSIFIED BASE 0.05 % EX CREA
TOPICAL_CREAM | Freq: Two times a day (BID) | CUTANEOUS | Status: DC
  Filled 2023-09-04: qty 15

## 2023-09-04 NOTE — Progress Notes (Signed)
 Progress Note  Patient Name: Kim Anthony Date of Encounter: 09/04/2023  Primary Cardiologist: Arvilla Meres, MD   Subjective   Patient seen examined at her bedside.  She was currently on arrival.  No complaints at this time.  Inpatient Medications    Scheduled Meds:  Chlorhexidine Gluconate Cloth  6 each Topical Daily   dapagliflozin propanediol  10 mg Oral Daily   feeding supplement  237 mL Oral BID BM   fluocinonide-emollient   Topical BID   fluticasone furoate-vilanterol  1 puff Inhalation Daily   And   umeclidinium bromide  1 puff Inhalation Daily   gabapentin  100 mg Oral TID   insulin aspart  0-15 Units Subcutaneous Q4H   pantoprazole  40 mg Oral Daily   potassium chloride  40 mEq Oral BID   predniSONE  40 mg Oral Daily   QUEtiapine  25 mg Oral QHS   torsemide  100 mg Oral Daily   warfarin  4 mg Oral ONCE-1600   Warfarin - Pharmacist Dosing Inpatient   Does not apply q1600   Continuous Infusions:  PRN Meds: acetaminophen, mouth rinse, oxyCODONE   Vital Signs    Vitals:   09/03/23 1955 09/03/23 2330 09/04/23 0339 09/04/23 0723  BP: (!) 138/99 118/72 112/66 124/83  Pulse: 84 84 84   Resp: 16 18 16 18   Temp: 98 F (36.7 C) 98.6 F (37 C) 97.8 F (36.6 C) 98.4 F (36.9 C)  TempSrc: Oral Oral Oral Oral  SpO2: 95% 97% 93%   Weight:   68.9 kg   Height:        Intake/Output Summary (Last 24 hours) at 09/04/2023 0842 Last data filed at 09/03/2023 2239 Gross per 24 hour  Intake 720 ml  Output 1950 ml  Net -1230 ml   Filed Weights   09/03/23 0500 09/03/23 0750 09/04/23 0339  Weight: 73.6 kg 69.9 kg 68.9 kg    Telemetry    Sinus rhythm- Personally Reviewed  ECG    None today- Personally Reviewed  Physical Exam    General: Comfortable Head: Atraumatic, normal size  Eyes: PEERLA, EOMI  Neck: Supple, normal JVD Cardiac: Normal S1, S2; RRR; no murmurs, rubs, or gallops Lungs: Clear to auscultation bilaterally Abd: Soft, nontender, no  hepatomegaly  Ext: warm, no edema Musculoskeletal: No deformities, BUE and BLE strength normal and equal Skin: Warm and dry, no rashes   Neuro: Alert and oriented to person, place, time, and situation, CNII-XII grossly intact, no focal deficits  Psych: Normal mood and affect   Labs    Chemistry Recent Labs  Lab 09/02/23 0643 09/03/23 0500 09/04/23 0646  NA 129* 129* 127*  K 3.8 3.9 3.9  CL 91* 92* 92*  CO2 25 25 26   GLUCOSE 87 107* 89  BUN 51* 64* 77*  CREATININE 1.92* 1.76* 1.92*  CALCIUM 8.6* 9.2 8.6*  GFRNONAA 27* 30* 27*  ANIONGAP 13 12 9      Hematology Recent Labs  Lab 09/02/23 0643 09/03/23 0500 09/04/23 0646  WBC 18.7* 14.5* 16.9*  RBC 3.14* 3.07* 3.09*  HGB 8.8* 8.5* 8.5*  HCT 27.6* 27.2* 27.4*  MCV 87.9 88.6 88.7  MCH 28.0 27.7 27.5  MCHC 31.9 31.3 31.0  RDW 19.6* 19.2* 18.7*  PLT 826* 805* 794*    Cardiac EnzymesNo results for input(s): "TROPONINI" in the last 168 hours. No results for input(s): "TROPIPOC" in the last 168 hours.   BNPNo results for input(s): "BNP", "PROBNP" in the last 168 hours.  DDimer No results for input(s): "DDIMER" in the last 168 hours.   Radiology    No results found.  Cardiac Studies   Reviewed echo   Patient Profile     73 y.o. female with a history of mechanical MVR in 2001 in [place], [place], CAD, hx breast cancer s/p left mastectomy and radiation/chemo, tobacco use, COPD, prior PE/DVT, HTN, being followed for acute on chronic heart failure with systolic ejection fraction.   Assessment & Plan    Acute on chronic heart failure with RV failure Heart failure with improved ejection fraction Pulmonary hypertension History of mechanical mitral valve placed in 2001 Acute on chronic kidney disease Paroxysmal atrial fibrillation Coronary artery disease with elevated troponin suspect demand ischemia Recent GI bleeding With severe tricuspid regurgitation on recent echo in February 2025 Electrolyte  abnormality   Clinically does not appear to be fluid overloaded.  She appears to be tolerating her p.o. diuretics torsemide 100 mg daily.  Milrinone was stopped yesterday.  At this time we will not adjust her diuretics total output 1230 mL. She is in sinus rhythm, with previously on amiodarone.  If continuing to remain clinically stable we will initiate her exception low-dose beta-blocker.  Hemoglobin stable continue her warfarin in the setting of her atrial fibrillation as well as mitral valve replacement. INR today 3.1-within target of 2.5-3.5.  Has tolerated the Farxiga  Creatinine and BUN increased slightly compared to yesterday's report.  Will continue to monitor closely.  Still hyponatremic     For questions or updates, please contact [facility] Please consult [email or web address] for contact info under Cardiology/STEMI.      SignedThomasene Ripple, DO  09/04/2023, 8:42 AM

## 2023-09-04 NOTE — Progress Notes (Incomplete)
 Interval history Subjective: Kim Anthony reports her wrist pain as improved. Denies any vaginal bleeding today.   Overnight events: PT saw her and continue to endorse difficulty with ambulation on RA. Day 3/3 of prednisone.  Objective:  Blood pressure 132/67, pulse 84, temperature 98.5 F (36.9 C), temperature source Oral, resp. rate 18, height 5\' 5"  (1.651 m), weight 68.9 kg, SpO2 93%.  Physical exam Constitutional:      Appearance: Normal appearance.    Comments: Sitting up in chair.  Cardiovascular:     Comments: S3 sound noted on auscultation. No other murmurs appreciated. No LE edema. Strong pedal pulses. Pulmonary:     Effort: Pulmonary effort is normal.     Comments: Bilateral crackles appreciated at lung bases. Skin:    General: Skin is warm and dry. B/l LE warm. Neurological:     Mental Status: She is alert.  MSK: Not braced. ROM improved.  Weight change: -3.7 kg    Intake/Output Summary (Last 24 hours) at 09/04/2023 1152 Last data filed at 09/04/2023 1031 Gross per 24 hour  Intake 960 ml  Output 2750 ml  Net -1790 ml   Net IO Since Admission: -14,082.64 mL [09/04/23 1152]   Labs, images, and other studies CVP 15 Co-ox 50.8 Na 127 K+ 3.9 Cr 1.92 GFR  27 INR 3.1 Hg 8.5     Assessment and plan Hospital day 17  Kim Anthony is a 73 y.o.  with a PMH of biventricular HF, pulm HTN, gout, COPD, paroxysmal afib,AKI, CKD who was admitted for acute upper GI bleed 2/2 gastritis with melena and hypotension.     Active Problems: Biventricular heart failure with reduced left ventricular function (HCC) RV failure  HFimpEF Pulmonary hypertension, low resistance (HCC) Stable. Concern about her Cr elevation would like to see that back at baseline will need to continue to monitor. Continue to monitor I/Os while on additional diuretic and see if her volume status is improving. Her CVP is decreasing while on milrinone. Cardiology agrees she is not  looking fluid overloaded on exam today and is tolerating po diuretics well. Will not adjust at this moment. Coox at 50.9 is decreased 1 day after stopping milrinone will continue to monitor.  Plan: F/U BMP Torsemide 100mg  daily Farxiga 10 mg daily Repeat Co-ox studies   CKD stage 4, GFR 45-59 ml/min (HCC) AKI (acute kidney injury) (HCC) Cr 1.92, GFR  27. This is improving. Will continue to monitor trends.  Plan: F/U BMP   S/P mitral valve replacement Warfarin anticoagulation DVT (deep venous thrombosis) (HCC) Patient is chronically stable on Warfarin. INR 3.1.   Plan:  Continue Warfarin daily F/U INR   F/U CBC  Atrial fibrillation with RVR (HCC) QT prolongation Stable. Remains in NSR on tele. If continuing to remain stable Cardiology thinks its best to start her low dose beta blocker.    R wrist pain 2/2 to gout  Improving. Day 3/3 of prednisone. She does complain of a rash on her back since yesterday. Will continue to monitor for side effects.    Chronic conditions: COPD (chronic obstructive pulmonary disease) (HCC): continue breo ellipta and incruse  #Chronic Back Pain: Continue to hold NSAIDs indefinitely #Postherpetic Neuralgia: Continue gabapentin   Resolved Problems:   Hypotension   Acute renal failure (HCC)   Anemia   Acute GI bleeding   ABLA (acute blood loss anemia)   Acute delirium  Aspiration pneumonia (HCC)   AKI (acute kidney injury) (HCC)   VTE prophylaxis: Warfarin 5mg  Diet: Regular diet IVF: Torsemide 100mg  Code: Full AB: None  PT/OT recommendations:   Pt is progressing well towards goals. Pt states she will have 24/7 physical assist available at home from daughter, however, unclear if information is accurate.   If plan is discharge home, recommend the following: Assistance with cooking/housework;Assist for transportation;Help with stairs or ramp for entrance;A little help with walking and/or transfers   Family Contact: Kenner,Monica , to  be notified.   Attestation for Student Documentation:  I personally was present and re-performed the history, physical exam and medical decision-making activities of this service and have verified that the service and findings are accurately documented in the student's note.  Rosana Fret, Medical Student 09/04/2023, 11:52 AM    Rosana Fret, Medical Student 09/04/2023, 11:52 AM

## 2023-09-04 NOTE — Plan of Care (Signed)
   Problem: Fluid Volume: Goal: Ability to maintain a balanced intake and output will improve Outcome: Progressing   Problem: Health Behavior/Discharge Planning: Goal: Ability to identify and utilize available resources and services will improve Outcome: Progressing

## 2023-09-04 NOTE — Progress Notes (Signed)
 PHARMACY - ANTICOAGULATION CONSULT NOTE  Pharmacy Consult for warfarin  Indication:  Mechanical mitral valve replacement  Allergies  Allergen Reactions   Lisinopril Swelling   Acetaminophen-Codeine Itching   Propoxyphene Itching    Darvocet   Tape Rash    Plastic   Tramadol Itching, Nausea And Vomiting and Rash    Patient Measurements: Height: 5\' 5"  (165.1 cm) Weight: 68.9 kg (151 lb 14.4 oz) IBW/kg (Calculated) : 57 Heparin Dosing Weight: TBW  Vital Signs: Temp: 97.8 F (36.6 C) (03/15 0339) Temp Source: Oral (03/15 0339) BP: 112/66 (03/15 0339) Pulse Rate: 84 (03/15 0339)  Labs: Recent Labs    09/01/23 0757 09/02/23 0643 09/03/23 0500  HGB  --  8.8* 8.5*  HCT  --  27.6* 27.2*  PLT  --  826* 805*  LABPROT  --  25.2* 28.3*  INR  --  2.3* 2.6*  CREATININE 1.88* 1.92* 1.76*    Estimated Creatinine Clearance: 27.8 mL/min (A) (by C-G formula based on SCr of 1.76 mg/dL (H)).   Medical History: Past Medical History:  Diagnosis Date   Acute delirium 08/30/2023   Acute GI bleeding 08/18/2023   Arthritis    Asthma    Breast cancer (HCC) 2001   Left Breast Cancer   CHF (congestive heart failure) (HCC)    COPD (chronic obstructive pulmonary disease) (HCC)    Coronary artery disease    GERD (gastroesophageal reflux disease)    History of mitral valve replacement with mechanical valve    HLD (hyperlipidemia)    Hypertension    Pulmonary embolism (HCC) 2021     Assessment: 8 YOF with hx AFib and mechanical MVR on warfarin (goal INR 2.5-3.5). Warfarin held on admit with melenic stools, heparin started for anticoagulation. EGD 3/4 with no bleeding, heparin and warfarin resumed postop.  INR is therapeutic at 3.1 (+0.4) after warfarin 4 mg x1. CBC is stable. No signs of bleeding. Diet is close to baseline and drinking 1x Ensure a day.  Home dose = 5mg  daily except 7.5mg  Mon/Thurs (5mg  daily per pt report)  Goal of Therapy:  INR goal 2.5-3.5 Monitor platelets  by anticoagulation protocol: Yes  Plan:  Warfarin 4 mg x1 tonight  Daily INR, CBC   Wilmer Floor, PharmD PGY2 Cardiology Pharmacy Resident  09/04/2023 6:47 AM   [facility] pharmacy phone numbers are listed on [email or web address]

## 2023-09-05 LAB — COOXEMETRY PANEL
Carboxyhemoglobin: 1.6 % — ABNORMAL HIGH (ref 0.5–1.5)
Methemoglobin: 1.1 % (ref 0.0–1.5)
O2 Saturation: 41.6 %
Total hemoglobin: 9.5 g/dL — ABNORMAL LOW (ref 12.0–16.0)

## 2023-09-05 LAB — PROTIME-INR
INR: 3.6 — ABNORMAL HIGH (ref 0.8–1.2)
Prothrombin Time: 35.9 s — ABNORMAL HIGH (ref 11.4–15.2)

## 2023-09-05 LAB — CBC
HCT: 28.6 % — ABNORMAL LOW (ref 36.0–46.0)
Hemoglobin: 8.9 g/dL — ABNORMAL LOW (ref 12.0–15.0)
MCH: 27.7 pg (ref 26.0–34.0)
MCHC: 31.1 g/dL (ref 30.0–36.0)
MCV: 89.1 fL (ref 80.0–100.0)
Platelets: 823 10*3/uL — ABNORMAL HIGH (ref 150–400)
RBC: 3.21 MIL/uL — ABNORMAL LOW (ref 3.87–5.11)
RDW: 18.7 % — ABNORMAL HIGH (ref 11.5–15.5)
WBC: 17.9 10*3/uL — ABNORMAL HIGH (ref 4.0–10.5)
nRBC: 0 % (ref 0.0–0.2)

## 2023-09-05 LAB — GLUCOSE, CAPILLARY
Glucose-Capillary: 140 mg/dL — ABNORMAL HIGH (ref 70–99)
Glucose-Capillary: 147 mg/dL — ABNORMAL HIGH (ref 70–99)
Glucose-Capillary: 157 mg/dL — ABNORMAL HIGH (ref 70–99)
Glucose-Capillary: 96 mg/dL (ref 70–99)
Glucose-Capillary: 99 mg/dL (ref 70–99)

## 2023-09-05 LAB — BASIC METABOLIC PANEL
Anion gap: 12 (ref 5–15)
BUN: 79 mg/dL — ABNORMAL HIGH (ref 8–23)
CO2: 26 mmol/L (ref 22–32)
Calcium: 8.8 mg/dL — ABNORMAL LOW (ref 8.9–10.3)
Chloride: 93 mmol/L — ABNORMAL LOW (ref 98–111)
Creatinine, Ser: 1.66 mg/dL — ABNORMAL HIGH (ref 0.44–1.00)
GFR, Estimated: 32 mL/min — ABNORMAL LOW (ref >60)
Glucose, Bld: 97 mg/dL (ref 70–99)
Potassium: 3.6 mmol/L (ref 3.5–5.1)
Sodium: 131 mmol/L — ABNORMAL LOW (ref 135–145)

## 2023-09-05 MED ORDER — WARFARIN SODIUM 2.5 MG PO TABS
2.5000 mg | ORAL_TABLET | Freq: Once | ORAL | Status: AC
  Administered 2023-09-05: 2.5 mg via ORAL
  Filled 2023-09-05: qty 1

## 2023-09-05 NOTE — Progress Notes (Signed)
 Progress Note  Patient Name: Kim Anthony Date of Encounter: 09/05/2023  Primary Cardiologist: Arvilla Meres, MD   Subjective   Patient seen examined at her bedside. She was sitting in bed awake when I arrived.   Inpatient Medications    Scheduled Meds:  Chlorhexidine Gluconate Cloth  6 each Topical Daily   dapagliflozin propanediol  10 mg Oral Daily   feeding supplement  237 mL Oral BID BM   fluticasone furoate-vilanterol  1 puff Inhalation Daily   And   umeclidinium bromide  1 puff Inhalation Daily   gabapentin  100 mg Oral TID   insulin aspart  0-15 Units Subcutaneous Q4H   pantoprazole  40 mg Oral Daily   potassium chloride  40 mEq Oral BID   QUEtiapine  25 mg Oral QHS   torsemide  100 mg Oral Daily   warfarin  2.5 mg Oral ONCE-1600   Warfarin - Pharmacist Dosing Inpatient   Does not apply q1600   Continuous Infusions:  PRN Meds: acetaminophen, diphenhydrAMINE-zinc acetate, mouth rinse, oxyCODONE   Vital Signs    Vitals:   09/04/23 1934 09/04/23 2336 09/05/23 0338 09/05/23 0744  BP: (!) 124/96 113/74 121/79 108/76  Pulse: 84 82 85   Resp: 18 16 18 18   Temp: 98.8 F (37.1 C) 98 F (36.7 C) 97.8 F (36.6 C) 97.7 F (36.5 C)  TempSrc: Oral Oral Oral Oral  SpO2: 96% 99% 96%   Weight:      Height:        Intake/Output Summary (Last 24 hours) at 09/05/2023 0905 Last data filed at 09/05/2023 0744 Gross per 24 hour  Intake 240 ml  Output 4250 ml  Net -4010 ml   Filed Weights   09/03/23 0500 09/03/23 0750 09/04/23 0339  Weight: 73.6 kg 69.9 kg 68.9 kg    Telemetry    Sinus rhythm- Personally Reviewed  ECG    None today- Personally Reviewed  Physical Exam    General: Comfortable Head: Atraumatic, normal size  Eyes: PEERLA, EOMI  Neck: Supple, normal JVD Cardiac: Normal S1, S2; RRR; no murmurs, rubs, or gallops Lungs: Clear to auscultation bilaterally Abd: Soft, nontender, no hepatomegaly  Ext: warm, no edema Musculoskeletal: No  deformities, BUE and BLE strength normal and equal Skin: Warm and dry, no rashes   Neuro: Alert and oriented to person, place, time, and situation, CNII-XII grossly intact, no focal deficits  Psych: Normal mood and affect   Labs    Chemistry Recent Labs  Lab 09/03/23 0500 09/04/23 0646 09/05/23 0445  NA 129* 127* 131*  K 3.9 3.9 3.6  CL 92* 92* 93*  CO2 25 26 26   GLUCOSE 107* 89 97  BUN 64* 77* 79*  CREATININE 1.76* 1.92* 1.66*  CALCIUM 9.2 8.6* 8.8*  GFRNONAA 30* 27* 32*  ANIONGAP 12 9 12      Hematology Recent Labs  Lab 09/03/23 0500 09/04/23 0646 09/05/23 0445  WBC 14.5* 16.9* 17.9*  RBC 3.07* 3.09* 3.21*  HGB 8.5* 8.5* 8.9*  HCT 27.2* 27.4* 28.6*  MCV 88.6 88.7 89.1  MCH 27.7 27.5 27.7  MCHC 31.3 31.0 31.1  RDW 19.2* 18.7* 18.7*  PLT 805* 794* 823*    Cardiac EnzymesNo results for input(s): "TROPONINI" in the last 168 hours. No results for input(s): "TROPIPOC" in the last 168 hours.   BNPNo results for input(s): "BNP", "PROBNP" in the last 168 hours.   DDimer No results for input(s): "DDIMER" in the last 168 hours.   Radiology  No results found.  Cardiac Studies   Reviewed echo   Patient Profile     73 y.o. female with a history of mechanical MVR in 2001 in [place], [place], CAD, hx breast cancer s/p left mastectomy and radiation/chemo, tobacco use, COPD, prior PE/DVT, HTN, being followed for acute on chronic heart failure with systolic ejection fraction.   Assessment & Plan    Acute on chronic heart failure with RV failure Heart failure with improved ejection fraction Pulmonary hypertension History of mechanical mitral valve placed in 2001 Acute on chronic kidney disease Paroxysmal atrial fibrillation Coronary artery disease with elevated troponin suspect demand ischemia Recent GI bleeding With severe tricuspid regurgitation on recent echo in February 2025 Electrolyte abnormality   Clinically does not appear to be fluid overloaded.  She  appears to be tolerating her p.o. diuretics torsemide 100 mg daily.   At this time we will not adjust her diuretics total output -4010 mL.  She is in sinus rhythm, with previously on amiodarone.  If continuing to remain clinically stable we will initiate her exception low-dose beta-blocker.  Hemoglobin stable continue her warfarin in the setting of her atrial fibrillation as well as mitral valve replacement. INR today 3.6-slightly elevated, target of 2.5-3.5.  Dosing per pharmacy team.    Creatinine has improved today 1.66 down from yesterday  Na is 131 today       For questions or updates, please contact [facility] Please consult [email or web address] for contact info under Cardiology/STEMI.      Signed, Heidy Mccubbin, DO  09/05/2023, 9:05 AM

## 2023-09-05 NOTE — Progress Notes (Signed)
 PHARMACY - ANTICOAGULATION CONSULT NOTE  Pharmacy Consult for warfarin  Indication:  Mechanical mitral valve replacement  Allergies  Allergen Reactions   Lisinopril Swelling   Acetaminophen-Codeine Itching   Propoxyphene Itching    Darvocet   Tape Rash    Plastic   Tramadol Itching, Nausea And Vomiting and Rash    Patient Measurements: Height: 5\' 5"  (165.1 cm) Weight: 68.9 kg (151 lb 14.4 oz) IBW/kg (Calculated) : 57 Heparin Dosing Weight: TBW  Vital Signs: Temp: 97.8 F (36.6 C) (03/16 0338) Temp Source: Oral (03/16 0338) BP: 121/79 (03/16 0338) Pulse Rate: 85 (03/16 0338)  Labs: Recent Labs    09/03/23 0500 09/04/23 0646 09/05/23 0445  HGB 8.5* 8.5* 8.9*  HCT 27.2* 27.4* 28.6*  PLT 805* 794* 823*  LABPROT 28.3* 32.1* 35.9*  INR 2.6* 3.1* 3.6*  CREATININE 1.76* 1.92* 1.66*    Estimated Creatinine Clearance: 29.4 mL/min (A) (by C-G formula based on SCr of 1.66 mg/dL (H)).   Medical History: Past Medical History:  Diagnosis Date   Acute delirium 08/30/2023   Acute GI bleeding 08/18/2023   Arthritis    Asthma    Breast cancer (HCC) 2001   Left Breast Cancer   CHF (congestive heart failure) (HCC)    COPD (chronic obstructive pulmonary disease) (HCC)    Coronary artery disease    GERD (gastroesophageal reflux disease)    History of mitral valve replacement with mechanical valve    HLD (hyperlipidemia)    Hypertension    Pulmonary embolism (HCC) 2021     Assessment: 73 YOF with hx AFib and mechanical MVR on warfarin (goal INR 2.5-3.5). Warfarin held on admit with melenic stools, heparin started for anticoagulation. EGD 3/4 with no bleeding, heparin and warfarin resumed postop.  INR is supratherapeutic at 3.6 (+0.5) after warfarin 4 mg x1. CBC is stable. No signs of bleeding. Diet is close to baseline and drinking 1x Ensure a day.  Home dose = 5mg  daily except 7.5mg  Mon/Thurs (5mg  daily per pt report)  Goal of Therapy:  INR goal 2.5-3.5 Monitor  platelets by anticoagulation protocol: Yes  Plan:  Warfarin 2.5 mg x1 tonight  Monitor daily INR, CBC   Wilmer Floor, PharmD PGY2 Cardiology Pharmacy Resident  09/05/2023 6:56 AM   [facility] pharmacy phone numbers are listed on [email or web address]

## 2023-09-05 NOTE — Plan of Care (Signed)
  Problem: Health Behavior/Discharge Planning: Goal: Ability to identify and utilize available resources and services will improve Outcome: Progressing Goal: Ability to manage health-related needs will improve Outcome: Progressing   Problem: Metabolic: Goal: Ability to maintain appropriate glucose levels will improve Outcome: Progressing

## 2023-09-05 NOTE — Progress Notes (Signed)
 HD#18 SUBJECTIVE:  Patient Summary: Kim Anthony is a 73 y.o. with a pertinent PMH of biventricular heart failure, mitral valve replacement c/b valvular A-fib on warfarin, CAD and gout, who presented with pretension and melena and admitted for upper GI bleed.   Overnight Events: No overnight event  Interim History: Patient was laying in bed  during my encounter this morning.  She was in no acute distress and had no concerns overnight.  Her wrist pain is gotten better she says. OBJECTIVE:  Vital Signs: Vitals:   09/04/23 1934 09/04/23 2336 09/05/23 0338 09/05/23 0744  BP: (!) 124/96 113/74 121/79 108/76  Pulse: 84 82 85   Resp: 18 16 18 18   Temp: 98.8 F (37.1 C) 98 F (36.7 C) 97.8 F (36.6 C) 97.7 F (36.5 C)  TempSrc: Oral Oral Oral Oral  SpO2: 96% 99% 96%   Weight:      Height:       Supplemental O2: Nasal Cannula SpO2: 96 % O2 Flow Rate (L/min): 2 L/min FiO2 (%): 28 %  Filed Weights   09/03/23 0500 09/03/23 0750 09/04/23 0339  Weight: 73.6 kg 69.9 kg 68.9 kg     Intake/Output Summary (Last 24 hours) at 09/05/2023 1029 Last data filed at 09/05/2023 0744 Gross per 24 hour  Intake 240 ml  Output 4250 ml  Net -4010 ml   Net IO Since Admission: -17,292.64 mL [09/05/23 1029]  Physical Exam: Physical Exam Constitutional:      Appearance: Normal appearance.  Cardiovascular:     Comments: S3 sound noted on ascultation . No other murmurs appreciated  Pulmonary:     Effort: Pulmonary effort is normal.     Comments: Bilateral crackles appreciated,improved from yesterday  Skin:    General: Skin is warm and dry.  Neurological:     Mental Status: She is alert.     Patient Lines/Drains/Airways Status     Active Line/Drains/Airways     Name Placement date Placement time Site Days   PICC Triple Lumen 08/18/23 Right Brachial 40 cm 2 cm 08/18/23  1606  -- 12   External Urinary Catheter 08/19/23  1600  --  11   Wound / Incision (Open or Dehisced) 01/06/21  Breast  Left Masectomy Site 01/06/21  0800  Breast  966             ASSESSMENT/PLAN:  Assessment: Principal Problem:   Biventricular heart failure with reduced left ventricular function (HCC) Active Problems:   Chronic low back pain   COPD (chronic obstructive pulmonary disease) (HCC)   S/P mitral valve replacement   HFrEF (heart failure with reduced ejection fraction) (HCC)   Warfarin anticoagulation   Atrial fibrillation with RVR (HCC)   CKD stage 3a, GFR 45-59 ml/min (HCC)   Postherpetic neuralgia   QT prolongation   Pulmonary hypertension, low resistance (HCC)   Acute heart failure with reduced ejection fraction (HFrEF, <= 40%) (HCC)   RVF (right ventricular failure) (HCC)   DVT (deep venous thrombosis) (HCC)   Plan: #Acute GI bleed  Continuing Protonix.    # Acute on chronic right ventricular failure,complicated by septic shock #Heart Failure with improved EF #Pulmonary Hypertension  Cox down to 41.6 from 50.8 off milrinone. Cardiology following. Continuing Torsemide 100 mg daily for further diuresing and monitoring Scr .   # Acute kidney injury # Hypokalemia - RESOLVED  Cr 1.66 < 1.92. Making urine . Trend Cr while on Torsemide   #Demand cardiac ischemia #Paroxysmal A-fib status post  cardioversion 2023 #Mechanical atrial valve replacement on Coumadin Patient is chronically stable. In sinus rhythm this morning. Continuing warfarin   #Delirium Continue bedtime Seroquel and Delirium precaution  #Chronic conditions COPD :Continue Breo Ellipta and Incruse Postherpetic neuralgia : Continue gabapentin  Best Practice: Diet: Regular diet IVF: None Code: Full AB: None  Family Contact: Malecki,Monica , to be notified. DISPO: Anticipated discharge tomorrow to Home pending  further diuresing and resolution of shock per cardiology   .  Signature: Kathleen Lime , MD Internal Medicine Resident, PGY-1 [facility]  Pager: [phone] 10:29 AM,  09/05/2023   Please contact the on call pager after 5 pm and on weekends at [phone].

## 2023-09-05 NOTE — Progress Notes (Signed)
 Mobility Specialist Progress Note:   09/05/23 0920  Mobility  Activity Ambulated with assistance in room  Level of Assistance Contact guard assist, steadying assist  Assistive Device Front wheel walker  Distance Ambulated (ft) 30 ft  Activity Response Tolerated fair (endorsed lethargy d/t pain meds)  Mobility Referral Yes  Mobility visit 1 Mobility  Mobility Specialist Start Time (ACUTE ONLY) 0920  Mobility Specialist Stop Time (ACUTE ONLY) 0932  Mobility Specialist Time Calculation (min) (ACUTE ONLY) 12 min   Pt agreeable to limited mobility session, c/o feeling "sleepy" from pain medicine. Able to ambulate in room with minG assist and RW before asking to return to chair. SpO2 89% on 2LO2 with exertion. Pt back in chair with all needs met.   Addison Lank Mobility Specialist Please contact via SecureChat or  Rehab office at [phone]

## 2023-09-06 LAB — CBC
HCT: 29.2 % — ABNORMAL LOW (ref 36.0–46.0)
Hemoglobin: 9.1 g/dL — ABNORMAL LOW (ref 12.0–15.0)
MCH: 27.9 pg (ref 26.0–34.0)
MCHC: 31.2 g/dL (ref 30.0–36.0)
MCV: 89.6 fL (ref 80.0–100.0)
Platelets: 733 10*3/uL — ABNORMAL HIGH (ref 150–400)
RBC: 3.26 MIL/uL — ABNORMAL LOW (ref 3.87–5.11)
RDW: 18.5 % — ABNORMAL HIGH (ref 11.5–15.5)
WBC: 16.7 10*3/uL — ABNORMAL HIGH (ref 4.0–10.5)
nRBC: 0.3 % — ABNORMAL HIGH (ref 0.0–0.2)

## 2023-09-06 LAB — IRON AND TIBC
Iron: 26 ug/dL — ABNORMAL LOW (ref 28–170)
Saturation Ratios: 6 % — ABNORMAL LOW (ref 10.4–31.8)
TIBC: 441 ug/dL (ref 250–450)
UIBC: 415 ug/dL

## 2023-09-06 LAB — BASIC METABOLIC PANEL
Anion gap: 10 (ref 5–15)
BUN: 90 mg/dL — ABNORMAL HIGH (ref 8–23)
CO2: 24 mmol/L (ref 22–32)
Calcium: 8.7 mg/dL — ABNORMAL LOW (ref 8.9–10.3)
Chloride: 96 mmol/L — ABNORMAL LOW (ref 98–111)
Creatinine, Ser: 1.85 mg/dL — ABNORMAL HIGH (ref 0.44–1.00)
GFR, Estimated: 28 mL/min — ABNORMAL LOW (ref >60)
Glucose, Bld: 121 mg/dL — ABNORMAL HIGH (ref 70–99)
Potassium: 3.8 mmol/L (ref 3.5–5.1)
Sodium: 130 mmol/L — ABNORMAL LOW (ref 135–145)

## 2023-09-06 LAB — GLUCOSE, CAPILLARY
Glucose-Capillary: 110 mg/dL — ABNORMAL HIGH (ref 70–99)
Glucose-Capillary: 125 mg/dL — ABNORMAL HIGH (ref 70–99)
Glucose-Capillary: 126 mg/dL — ABNORMAL HIGH (ref 70–99)
Glucose-Capillary: 183 mg/dL — ABNORMAL HIGH (ref 70–99)
Glucose-Capillary: 161 mg/dL — ABNORMAL HIGH (ref 70–99)

## 2023-09-06 LAB — COOXEMETRY PANEL
Carboxyhemoglobin: 2.2 % — ABNORMAL HIGH (ref 0.5–1.5)
Methemoglobin: 0.8 % (ref 0.0–1.5)
O2 Saturation: 61.5 %
Total hemoglobin: 9.3 g/dL — ABNORMAL LOW (ref 12.0–16.0)

## 2023-09-06 LAB — PROTIME-INR
INR: 3.3 — ABNORMAL HIGH (ref 0.8–1.2)
Prothrombin Time: 33.4 s — ABNORMAL HIGH (ref 11.4–15.2)

## 2023-09-06 LAB — FERRITIN: Ferritin: 27 ng/mL (ref 11–307)

## 2023-09-06 MED ORDER — IRON SUCROSE 500 MG IVPB - SIMPLE MED
500.0000 mg | Freq: Once | INTRAVENOUS | Status: DC
  Filled 2023-09-06: qty 275

## 2023-09-06 MED ORDER — PREDNISONE 20 MG PO TABS
40.0000 mg | ORAL_TABLET | Freq: Every day | ORAL | Status: DC
  Administered 2023-09-06 – 2023-09-07 (×2): 40 mg via ORAL
  Filled 2023-09-06 (×2): qty 2

## 2023-09-06 MED ORDER — SODIUM CHLORIDE 0.9 % IV SOLN
500.0000 mg | Freq: Once | INTRAVENOUS | Status: AC
  Administered 2023-09-06: 500 mg via INTRAVENOUS
  Filled 2023-09-06: qty 25

## 2023-09-06 MED ORDER — WARFARIN SODIUM 4 MG PO TABS
4.0000 mg | ORAL_TABLET | Freq: Once | ORAL | Status: AC
  Administered 2023-09-06: 4 mg via ORAL
  Filled 2023-09-06: qty 1

## 2023-09-06 NOTE — TOC Progression Note (Addendum)
 Transition of Care [facility]) - Progression Note    Patient Details  Name: Kim Anthony [name]: [id number] Date of Birth: 07/10/1950  [facility] of Care [facility]) CM/SW Contact  Nicanor Bake Phone Number: [phone] 09/06/2023, 12:13 PM  Clinical Narrative:   HF CSW resubmitted pts insurance authorization. Berkley Harvey is pending [id number]. Will update once the pts status changes.   1:20 PM- HF CSW called the pts daughter to share updates about the pts insurance auth. Pts daughter stated that she wants to change SNF to [facility]. HF CSW contacted Tammy at [facility] to confirm bed availability. At this time there is a bed available. CSW secure chat CMA for assistance with changing insurance auth. Will follow up.   2:17 PM- HF CSW called pts daughter to share updates about insurance auth. Per Hospital doctor pt is out of network.    2:52 PM- HF CSW received call back from pts daughter. CSW stated that the pts insurance was OON for [facility]. Pts daughter stated that she wanted to look into alternate SNFs and will call back shortly with updates.   TOC will continue following.       Barriers to Discharge: Continued Medical Work up  Expected Discharge Plan and Services       Living arrangements for the past 2 months: Single Family Home                                       Social Determinants of Health (SDOH) Interventions SDOH Screenings   Food Insecurity: No Food Insecurity (08/18/2023)  Housing: Low Risk  (08/18/2023)  Transportation Needs: No Transportation Needs (08/18/2023)  Utilities: Not At Risk (08/18/2023)  Alcohol Screen: Low Risk  (12/01/2022)  Depression (PHQ2-9): Low Risk  (12/01/2022)  Financial Resource Strain: Low Risk  (12/01/2022)  Physical Activity: Inactive (12/01/2022)  Social Connections: Patient Declined (08/18/2023)  Stress: Stress Concern Present (12/01/2022)  Tobacco Use: High Risk (08/24/2023)    Readmission Risk Interventions     02/19/2022    3:36 PM  Readmission Risk Prevention Plan  Transportation Screening Complete  PCP or Specialist Appt within 3-5 Days Complete  HRI or Home Care Consult Complete  Social Work Consult for Recovery Care Planning/Counseling Complete  Palliative Care Screening Not Applicable  Medication Review Oceanographer) Complete

## 2023-09-06 NOTE — Progress Notes (Signed)
 HD#19 SUBJECTIVE:  Patient Summary: Kim Anthony is a 73 y.o. with a pertinent PMH of biventricular heart failure, mitral valve replacement c/b valvular A-fib on warfarin, CAD and gout, who presented with pretension and melena and admitted for upper GI bleed.   Overnight Events: She reports worsening of her wrist pain one day after finished prednisone course 40mg  for 3 days. She denies any further back itching. She is being transferred to med-tele.  Interim History: Patient was laying in bed  during my encounter this morning.  She was in no acute distress and had no concerns overnight. Her wrist pain on diphenhydramine cream.  OBJECTIVE:  Vital Signs: Vitals:   09/06/23 0452 09/06/23 0610 09/06/23 0752 09/06/23 1145  BP: 94/66  121/72 104/64  Pulse:   87 85  Resp: 17  18 18   Temp: 98.9 F (37.2 C)  98.7 F (37.1 C) 98.1 F (36.7 C)  TempSrc: Oral  Oral Oral  SpO2: 98%     Weight:  69.6 kg    Height:       Supplemental O2: Nasal Cannula SpO2: 98 % O2 Flow Rate (L/min): 2 L/min FiO2 (%): 28 %  Filed Weights   09/04/23 0339 09/05/23 0714 09/06/23 0610  Weight: 68.9 kg 68.8 kg 69.6 kg   Labs CVP 14 Coox 61.5 Hg 9.1, stable Na 130 K+ 3.8 Cr 1.85 GFR 28 Ferritin, 27 Iron 26,  TIBC 441   Intake/Output Summary (Last 24 hours) at 09/06/2023 1525 Last data filed at 09/06/2023 0920 Gross per 24 hour  Intake 1000 ml  Output 425 ml  Net 575 ml   Net IO Since Admission: -16,477.64 mL [09/06/23 1525]  Physical Exam: Physical Exam Constitutional:      Appearance: Normal appearance.     Comments: Not volume overloaded compared to yesterday  Cardiovascular:     Comments: Warm and Wet  Pulmonary:     Effort: Pulmonary effort is normal.  Skin:    General: Skin is warm and dry.  Neurological:     Mental Status: She is alert.     Patient Lines/Drains/Airways Status     Active Line/Drains/Airways     Name Placement date Placement time Site Days   PICC Triple  Lumen 08/18/23 Right Brachial 40 cm 2 cm 08/18/23  1606  -- 12   External Urinary Catheter 08/19/23  1600  --  11   Wound / Incision (Open or Dehisced) 01/06/21  Breast Left Masectomy Site 01/06/21  0800  Breast  966             ASSESSMENT/PLAN:  Assessment: Principal Problem:   Biventricular heart failure with reduced left ventricular function (HCC) Active Problems:   Chronic low back pain   COPD (chronic obstructive pulmonary disease) (HCC)   S/P mitral valve replacement   HFrEF (heart failure with reduced ejection fraction) (HCC)   Warfarin anticoagulation   Atrial fibrillation with RVR (HCC)   CKD stage 3a, GFR 45-59 ml/min (HCC)   Postherpetic neuralgia   QT prolongation   Pulmonary hypertension, low resistance (HCC)   Acute heart failure with reduced ejection fraction (HFrEF, <= 40%) (HCC)   RVF (right ventricular failure) (HCC)   DVT (deep venous thrombosis) (HCC)   Plan: Acute on chronic right ventricular failure,complicated by septic shock Heart Failure with improved EF Pulmonary Hypertension  Coox 61.5. CVP 14. Holding Torsemide 100 mg daily with concerns of overdiuerisis. Will consider getting her back on  home medications per GDMT guidelines she  is currently taking [place]. Her Cr today was 1.85, up from 1.66 yesterday. Cardiology has Torsemide being held for the time being. Will monitor her volume status in I/Os and exam and get standing weights daily. On exam her crackles sound improved.  Plan: F/U BMP Holding Torsemide 100mg  daily Farxiga 10 mg daily  Acute kidney injury CKD stage 4 Trending Cr after holding Torsemide 100mg  daily to monitor.  Plan: F/U BMP  Demand cardiac ischemia Paroxysmal A-fib status post cardioversion 2023 Mechanical atrial valve replacement on Coumadin Patient is chronically stable. NS. Continuing warfarin but needs counseling on this per discharge.  May need to be started on low dose metoprolol home meds again for chronic  management.  Plan:  Continue Warfarin daily F/U INR     R wrist pain 2/2 to gout  Patient continues to report pain returning. It is best to start her on a prednisone again 5 days 40mg  and then taper for 2 weeks.  Plan:  Prednisone 5 days of 40mg , outpatient taper 2 wks  Acute GI bleed  Stable. Continuing Protonix.  Her iron studies show evidence of low iron, with low normal ferritin stores and high normal TIBC. Worsened compared to baseline 2 weeks ago. She may benefit from starting iron.   Plan: IV iron  Continue Protonix  #Chronic conditions COPD :Continue Breo Ellipta and Incruse Postherpetic neuralgia : Continue gabapentin  Best Practice: Diet: Regular diet IVF: None Code: Full AB: None  Family Contact: Wessells,Monica , to be notified. DISPO: Anticipated discharge tomorrow to Home pending SNF insurance. TOC will continue following.    Attestation for Student Documentation:  I personally was present and re-performed the history, physical exam and medical decision-making activities of this service and have verified that the service and findings are accurately documented in the student's note.  Kathleen Lime, MD 09/06/2023, 3:25 PM   Signature: Kathleen Lime , MD Internal Medicine Resident, PGY-1 [facility]  Pager: [phone] 3:25 PM, 09/06/2023   Please contact the on call pager after 5 pm and on weekends at [phone].

## 2023-09-06 NOTE — Plan of Care (Signed)
   Problem: Fluid Volume: Goal: Ability to maintain a balanced intake and output will improve Outcome: Progressing   Problem: Health Behavior/Discharge Planning: Goal: Ability to identify and utilize available resources and services will improve Outcome: Progressing Goal: Ability to manage health-related needs will improve Outcome: Progressing   Problem: Metabolic: Goal: Ability to maintain appropriate glucose levels will improve Outcome: Progressing   Problem: Nutritional: Goal: Maintenance of adequate nutrition will improve Outcome: Progressing Goal: Progress toward achieving an optimal weight will improve Outcome: Progressing

## 2023-09-06 NOTE — Progress Notes (Signed)
 Physical Therapy Treatment Patient Details Name: Kim Anthony [name]: [id number] DOB: 09/09/1950 Today's Date: 09/06/2023   History of Present Illness Pt is 73 yo presenting to ED 2/25 with melena and symptomatic anemia in setting of GI bleed and chronic coumadin use. 2/28 R heart cath in setting of acute on chronic RV failure and pulmonary hypertension. 3/11 R internal jugular DVT. PMH: gout, CAD, HFrEF, a fib, HTN, GERD, HLD, COPD, hx of breast cancer s/p L mastectomy and radiation/chemo.    PT Comments  Pt up in chair on arrival and agreeable to session however somewhat limited this date due to increased L knee and R wrist pain, despite pre-medication. Pt requiring grossly CGA-supervision for bed mobility, transfers and gait with RW for support with gait distance limited by knee pain. Pt contineus to demonstrate fair insight into deficits and safety throughout mobility. Pt continues to benefit from skilled PT services to progress toward functional mobility goals.     If plan is discharge home, recommend the following: Assistance with cooking/housework;Assist for transportation;Help with stairs or ramp for entrance;A little help with walking and/or transfers   Can travel by private vehicle     Yes  Equipment Recommendations  None recommended by PT    Recommendations for Other Services       Precautions / Restrictions Precautions Precautions: Fall Precaution/Restrictions Comments: monitor O2 Restrictions Weight Bearing Restrictions Per Provider Order: No     Mobility  Bed Mobility Overal bed mobility: Needs Assistance Bed Mobility: Supine to Sit Rolling: Supervision     Sit to supine: Supervision   General bed mobility comments: increased time    Transfers Overall transfer level: Needs assistance Equipment used: Rolling walker (2 wheels) Transfers: Sit to/from Stand Sit to Stand: Contact guard assist           General transfer comment: CGA for safety from recliner and  BSC in room, able to complete per-care in standing    Ambulation/Gait Ambulation/Gait assistance: Contact guard assist Gait Distance (Feet): 25 Feet Assistive device: Rolling walker (2 wheels) Gait Pattern/deviations: Step-through pattern, Decreased step length - right, Decreased step length - left, Shuffle, Trunk flexed Gait velocity: decr     General Gait Details: short steady steps with flexed trunk and reliance on RW support, cues to keep RW in contact with floor, distanec limited by pain despite pre-medication   Stairs             Wheelchair Mobility     Tilt Bed    Modified Rankin (Stroke Patients Only)       Balance Overall balance assessment: Needs assistance Sitting-balance support: Feet supported, No upper extremity supported Sitting balance-Leahy Scale: Good     Standing balance support: No upper extremity supported Standing balance-Leahy Scale: Poor Standing balance comment: able to maintain standing without UE support for peri-care                            Communication Communication Communication: No apparent difficulties  Cognition Arousal: Alert Behavior During Therapy: WFL for tasks assessed/performed                             Following commands: Intact      Cueing Cueing Techniques: Verbal cues  Exercises      General Comments General comments (skin integrity, edema, etc.): VSS on supplemental O2      Pertinent Vitals/Pain Pain Assessment Pain  Assessment: Faces Faces Pain Scale: Hurts little more Pain Location: L knee and R wrist Pain Descriptors / Indicators: Sore, Aching Pain Intervention(s): Premedicated before session, Monitored during session, Limited activity within patient's tolerance    Home Living                          Prior Function            PT Goals (current goals can now be found in the care plan section) Acute Rehab PT Goals PT Goal Formulation: With patient Time For  Goal Achievement: 09/08/23 Progress towards PT goals: Progressing toward goals    Frequency    Min 3X/week      PT Plan      Co-evaluation              AM-PAC PT "6 Clicks" Mobility   Outcome Measure  Help needed turning from your back to your side while in a flat bed without using bedrails?: None Help needed moving from lying on your back to sitting on the side of a flat bed without using bedrails?: A Little Help needed moving to and from a bed to a chair (including a wheelchair)?: A Little Help needed standing up from a chair using your arms (e.g., wheelchair or bedside chair)?: A Little Help needed to walk in hospital room?: A Little Help needed climbing 3-5 steps with a railing? : A Little 6 Click Score: 19    End of Session Equipment Utilized During Treatment: Gait belt;Oxygen Activity Tolerance: Patient tolerated treatment well Patient left: with call bell/phone within reach;in bed Nurse Communication: Mobility status PT Visit Diagnosis: Other abnormalities of gait and mobility (R26.89);Muscle weakness (generalized) (M62.81)     Time: 1610-9604 PT Time Calculation (min) (ACUTE ONLY): 18 min  Charges:    $Therapeutic Activity: 8-22 mins PT General Charges $$ ACUTE PT VISIT: 1 Visit                     Dewayne Severe R. PTA Acute Rehabilitation Services Office: [phone]   Catalina Antigua 09/06/2023, 3:55 PM

## 2023-09-06 NOTE — Progress Notes (Addendum)
 Advanced Heart Failure Rounding Note  Cardiologist: Arvilla Meres, MD   Chief Complaint: RV Failure Gout Pain   Subjective:   3/14: Milrinone and amio stopped. Switched to torsemide 100 mg daily  CO-OX 62%   Denies SOB. Complaining of pain in wrist from gout flare   Objective:    Weight Range: 69.6 kg Body mass index is 25.53 kg/m.   Vital Signs:   Temp:  [97.6 F (36.4 C)-98.9 F (37.2 C)] 98.7 F (37.1 C) (03/17 0752) Pulse Rate:  [83-87] 87 (03/17 0752) Resp:  [17-19] 18 (03/17 0752) BP: (94-130)/(62-78) 121/72 (03/17 0752) SpO2:  [92 %-98 %] 98 % (03/17 0452) Weight:  [69.6 kg] 69.6 kg (03/17 0610) Last BM Date : 09/02/23  Weight change: Filed Weights   09/04/23 0339 09/05/23 0714 09/06/23 0610  Weight: 68.9 kg 68.8 kg 69.6 kg   Intake/Output:   Intake/Output Summary (Last 24 hours) at 09/06/2023 0835 Last data filed at 09/06/2023 0612 Gross per 24 hour  Intake 960 ml  Output 425 ml  Net 535 ml     CVP 9-10  Physical Exam  General:   No resp difficulty Neck: supple. JVP 9-10   Cor: PMI nondisplaced. Regular rate & rhythm. No rubs, gallops  + TR. Lungs: clear Abdomen: soft, nontender, nondistended.  Extremities: no cyanosis, clubbing, rash, edema RUE PICC  Neuro: alert & oriented x3  Telemetry   SR 80s   EKG    N/A  Labs    CBC Recent Labs    09/05/23 0445 09/06/23 0550  WBC 17.9* 16.7*  HGB 8.9* 9.1*  HCT 28.6* 29.2*  MCV 89.1 89.6  PLT 823* 733*   Basic Metabolic Panel Recent Labs    78/29/56 0445 09/06/23 0550  NA 131* 130*  K 3.6 3.8  CL 93* 96*  CO2 26 24  GLUCOSE 97 121*  BUN 79* 90*  CREATININE 1.66* 1.85*  CALCIUM 8.8* 8.7*    BNP (last 3 results) Recent Labs    08/26/23 0404 08/27/23 0458 08/28/23 0414  BNP 375.3* 598.1* 589.0*   Other results:  Medications:    Scheduled Medications:  Chlorhexidine Gluconate Cloth  6 each Topical Daily   dapagliflozin propanediol  10 mg Oral Daily   feeding  supplement  237 mL Oral BID BM   fluticasone furoate-vilanterol  1 puff Inhalation Daily   And   umeclidinium bromide  1 puff Inhalation Daily   gabapentin  100 mg Oral TID   insulin aspart  0-15 Units Subcutaneous Q4H   pantoprazole  40 mg Oral Daily   potassium chloride  40 mEq Oral BID   QUEtiapine  25 mg Oral QHS   torsemide  100 mg Oral Daily   Warfarin - Pharmacist Dosing Inpatient   Does not apply q1600    Infusions:    PRN Medications: acetaminophen, diphenhydrAMINE-zinc acetate, mouth rinse, oxyCODONE  Patient Profile   Kim Anthony is a 73 y.o. female with a history of mechanical MVR in 2001 in [place], [place], CAD, hx breast cancer s/p left mastectomy and radiation/chemo, tobacco use, COPD, prior PE/DVT, HTN, undergoing workup for possible inflammatory arthritis with Rheumatology, and systolic CHF.   Assessment/Plan   1. Acute on chronic RV Failure HFimpEF Pulmonary HTN  - Suspect likely underlying OSA contributing to PH/RV failure. Also w/ known COPD. - Echo this admit EF 55% and D-shaped septum, RV-RA gradient 25 mmHg and severe TR (does not coapt) - NYHA III-IV on admit - Off  Milrinone > 48 hours. CO-OX stable 62%.  - CVP 9-10. Creatinine bump noted. Given CO-OX stable suspect over diuresed. Hold Torsemide today and potassium .  - Continue farxiga 10 mg daily  - BMET in am.    2. AKI - in the setting of RV congestion/low output - SCr baseline  0.8 - Scr Trending up 1.6>1.88.  - Continue Farxiga 10 mg daily. Given 1st dose 3/14 - Hold torsemide today.   3. PAF - She does not do well in AFL/AF. - Recent admit 2/25 with AFL, converted to NSR on amio, but then stopped d/t long QT - Warfarin has been restarted, INR 2.6 - Maintaining SR. Now off amiodarone.    4. CAD  Elevated Troponin - 1 V CAD with occluded mid RCA on cath (7/22) - hsTrop 113>121>198. Likely demand ischemia, decongest and follow - No chest pain.  - continue statin  5. GI bleed Anemia   - hypotension with melena requiring RBCs - EGD 3/04 with gastritis, no other significant findings - Hg stable.     6. Hx of mechanical mitral valve replacement: - Mechanical MVR in [place] in 2001. - Stable on echo 2/25 MV gradient 3 - INR 3.3 today. Continue Warfarin. Discussed with Pharm D.  - Continue SBE prophylaxis.   7. Tricuspid valve regurgitation - severe on echo 2/25 - Follow    8. Right hand/wrist pain Hx gout - Takes prednisone and NSAIDs at home - Suspect acute flare. Uric acid was elevated at 7.7.  -  colchicine discontinued due to diarrhea  - Having ongoing pain.   9. Hyponatremia - Hypervolemic hyponatremia - 130  today - Hold diuretics.   10. ID - ? Aspiration PNA.  - Completed antibiotics.  - WBC elevated but has got a few days of prednisone.   11. Hypokalemia - K 3.8.  - Stopping torsemide as noted above. Hold K Supplement.   12. Diarrhea - resolved w/ discontinuation of colchicine   Daily BMET ordered.   TOC working on rehab placement. Will follow up SW.   Length of Stay: 63  Amy Clegg NP-C  09/06/2023  Patient seen and examined with the above-signed Advanced Practice Provider and/or Housestaff. I personally reviewed laboratory data, imaging studies and relevant notes. I independently examined the patient and formulated the important aspects of the plan. I have edited the note to reflect any of my changes or salient points. I have personally discussed the plan with the patient and/or family.  Off milrinone. Co-ox ok. Denies CP or SOB. More alert. No further bleeding. INR 3.3  General:  Elderly woman. Sitting in chair. No resp difficulty HEENT: normal Neck: supple.JVP 6-7 Carotids 2+ bilat; no bruits. No lymphadenopathy or thryomegaly appreciated. Cor: PMI nondisplaced. Regular rate & rhythm. Mechanical s1 Lungs: clear Abdomen: soft, nontender, nondistended. No hepatosplenomegaly. No bruits or masses. Good bowel sounds. Extremities: no  cyanosis, clubbing, rash, edema Neuro: alert & orientedx3, cranial nerves grossly intact. moves all 4 extremities w/o difficulty. Affect pleasant  She is doing well. Co-ox stable off milrinone. INR therapeutic without recurrent bleeding. She is stable for d/c to SNF on meds as above.   Discussed warfarin dosing with PharmD personally.   Arvilla Meres, MD  4:47 PM

## 2023-09-06 NOTE — Progress Notes (Signed)
 Occupational Therapy Treatment Patient Details Name: Kim Anthony [name]: [id number] DOB: 1950-09-20 Today's Date: 09/06/2023   History of present illness Pt is 73 yo presenting to ED 2/25 with melena and symptomatic anemia in setting of GI bleed and chronic coumadin use. 2/28 R heart cath in setting of acute on chronic RV failure and pulmonary hypertension. 3/11 R internal jugular DVT. PMH: gout, CAD, HFrEF, a fib, HTN, GERD, HLD, COPD, hx of breast cancer s/p L mastectomy and radiation/chemo.   OT comments  Pt limited by drowsiness citing recent pain med administration so session somewhat limited. Overall, pt shows good insight into deficits, active in education on ADL modifications to decrease fall risk and maximize safety during the day while daughter at work. Pt requires limited assist for LB ADLs and feel pt would manage well in her own environment. SpO2 94% on RA.      If plan is discharge home, recommend the following:  A little help with bathing/dressing/bathroom;Assistance with cooking/housework   Equipment Recommendations  None recommended by OT    Recommendations for Other Services      Precautions / Restrictions Precautions Precautions: Fall Precaution/Restrictions Comments: monitor O2 Restrictions Weight Bearing Restrictions Per Provider Order: No       Mobility Bed Mobility               General bed mobility comments: in recliner on entry    Transfers                         Balance Overall balance assessment: Needs assistance Sitting-balance support: Feet supported, No upper extremity supported Sitting balance-Leahy Scale: Good                                     ADL either performed or assessed with clinical judgement   ADL Overall ADL's : Needs assistance/impaired Eating/Feeding: Set up;Sitting Eating/Feeding Details (indicate cue type and reason): assist to open containers                 Lower Body Dressing:  Minimal assistance;Sitting/lateral leans Lower Body Dressing Details (indicate cue type and reason): difficulty reaching to feet for sock mgmt. reports having a lower chair at home she uses for sock/shoe mgmt but also wears slides/slippers at home during the day. typically only wears socks/shoes outside of the home and can have family assist with this               General ADL Comments: Limited by sleepiness s/p oxy med administration per pt. Educated on ADL modifications if family not home or pt not feeling confident/stable (using BSC) or having assist with bathing/showering with setup prior to daughter leaving for work    Extremity/Trunk Assessment Upper Extremity Assessment Upper Extremity Assessment: Right hand dominant;RUE deficits/detail RUE Deficits / Details: has prefab wrist cock up splint for gout flare. reports some improvements in wrist/digit motion/pain but discomfort still present RUE Coordination: decreased fine motor   Lower Extremity Assessment Lower Extremity Assessment: Defer to PT evaluation        Vision   Vision Assessment?: No apparent visual deficits   Perception     Praxis     Communication Communication Communication: No apparent difficulties   Cognition Arousal: Alert Behavior During Therapy: WFL for tasks assessed/performed Cognition: No apparent impairments  Following commands: Intact        Cueing   Cueing Techniques: Verbal cues  Exercises      Shoulder Instructions       General Comments SpO2 94% on RA. blood noted after pt blew nose with tissue - nursing aware    Pertinent Vitals/ Pain       Pain Assessment Pain Assessment: No/denies pain  Home Living                                          Prior Functioning/Environment              Frequency  Min 1X/week        Progress Toward Goals  OT Goals(current goals can now be found in the care plan  section)  Progress towards OT goals: OT to reassess next treatment  Acute Rehab OT Goals Patient Stated Goal: not feel so sleepy OT Goal Formulation: With patient Time For Goal Achievement: 09/20/23 Potential to Achieve Goals: Good  Plan      Co-evaluation                 AM-PAC OT "6 Clicks" Daily Activity     Outcome Measure   Help from another person eating meals?: None Help from another person taking care of personal grooming?: A Little Help from another person toileting, which includes using toliet, bedpan, or urinal?: A Little Help from another person bathing (including washing, rinsing, drying)?: A Little Help from another person to put on and taking off regular upper body clothing?: A Little Help from another person to put on and taking off regular lower body clothing?: A Little 6 Click Score: 19    End of Session    OT Visit Diagnosis: Unsteadiness on feet (R26.81);Other abnormalities of gait and mobility (R26.89);Muscle weakness (generalized) (M62.81)   Activity Tolerance Patient limited by lethargy   Patient Left in chair;with call bell/phone within reach   Nurse Communication Mobility status;Other (comment) (nose bleed)        Time: 2951-8841 OT Time Calculation (min): 19 min  Charges: OT General Charges $OT Visit: 1 Visit OT Treatments $Self Care/Home Management : 8-22 mins  Bradd Canary, OTR/L Acute Rehab Services Office: [phone]   Lorre Munroe 09/06/2023, 12:42 PM

## 2023-09-06 NOTE — Progress Notes (Signed)
 PHARMACY - ANTICOAGULATION CONSULT NOTE  Pharmacy Consult for warfarin  Indication:  Mechanical mitral valve replacement  Allergies  Allergen Reactions   Lisinopril Swelling   Acetaminophen-Codeine Itching   Propoxyphene Itching    Darvocet   Tape Rash    Plastic   Tramadol Itching, Nausea And Vomiting and Rash    Patient Measurements: Height: 5\' 5"  (165.1 cm) Weight: 69.6 kg (153 lb 7 oz) IBW/kg (Calculated) : 57 Heparin Dosing Weight: TBW  Vital Signs: Temp: 98.1 F (36.7 C) (03/17 1145) Temp Source: Oral (03/17 1145) BP: 104/64 (03/17 1145) Pulse Rate: 85 (03/17 1145)  Labs: Recent Labs    09/04/23 0646 09/05/23 0445 09/06/23 0550  HGB 8.5* 8.9* 9.1*  HCT 27.4* 28.6* 29.2*  PLT 794* 823* 733*  LABPROT 32.1* 35.9* 33.4*  INR 3.1* 3.6* 3.3*  CREATININE 1.92* 1.66* 1.85*    Estimated Creatinine Clearance: 26.5 mL/min (A) (by C-G formula based on SCr of 1.85 mg/dL (H)).   Medical History: Past Medical History:  Diagnosis Date   Acute delirium 08/30/2023   Acute GI bleeding 08/18/2023   Arthritis    Asthma    Breast cancer (HCC) 2001   Left Breast Cancer   CHF (congestive heart failure) (HCC)    COPD (chronic obstructive pulmonary disease) (HCC)    Coronary artery disease    GERD (gastroesophageal reflux disease)    History of mitral valve replacement with mechanical valve    HLD (hyperlipidemia)    Hypertension    Pulmonary embolism (HCC) 2021     Assessment: 71 YOF with hx AFib and mechanical MVR on warfarin (goal INR 2.5-3.5). Warfarin held on admit with melenic stools, heparin started for anticoagulation. EGD 3/4 with no bleeding, heparin and warfarin resumed postop.  INR 3.3 at goal after some weekend dosing adjustments. CBC is stable. No signs of bleeding. Diet is close to baseline and drinking 1x Ensure a day.  Home dose = 5mg  daily except 7.5mg  Mon/Thurs (5mg  daily per pt report)  Goal of Therapy:  INR goal 2.5-3.5 Monitor platelets by  anticoagulation protocol: Yes  Plan:  Warfarin 4mg  x1 tonight  Monitor daily INR, CBC   Leota Sauers Pharm.D. CPP, BCPS Clinical Pharmacist [phone] 09/06/2023 1:10 PM   [facility] pharmacy phone numbers are listed on [email or web address]

## 2023-09-07 DIAGNOSIS — K921 Melena: Secondary | ICD-10-CM

## 2023-09-07 LAB — GLUCOSE, CAPILLARY
Glucose-Capillary: 149 mg/dL — ABNORMAL HIGH (ref 70–99)
Glucose-Capillary: 155 mg/dL — ABNORMAL HIGH (ref 70–99)
Glucose-Capillary: 116 mg/dL — ABNORMAL HIGH (ref 70–99)
Glucose-Capillary: 154 mg/dL — ABNORMAL HIGH (ref 70–99)

## 2023-09-07 LAB — BASIC METABOLIC PANEL
Anion gap: 10 (ref 5–15)
BUN: 75 mg/dL — ABNORMAL HIGH (ref 8–23)
CO2: 25 mmol/L (ref 22–32)
Calcium: 8.6 mg/dL — ABNORMAL LOW (ref 8.9–10.3)
Chloride: 98 mmol/L (ref 98–111)
Creatinine, Ser: 1.46 mg/dL — ABNORMAL HIGH (ref 0.44–1.00)
GFR, Estimated: 38 mL/min — ABNORMAL LOW (ref >60)
Glucose, Bld: 159 mg/dL — ABNORMAL HIGH (ref 70–99)
Potassium: 3.9 mmol/L (ref 3.5–5.1)
Sodium: 133 mmol/L — ABNORMAL LOW (ref 135–145)

## 2023-09-07 LAB — COOXEMETRY PANEL
Carboxyhemoglobin: 1.4 % (ref 0.5–1.5)
Carboxyhemoglobin: 1.7 % — ABNORMAL HIGH (ref 0.5–1.5)
Methemoglobin: <0.7 % (ref 0.0–1.5)
Methemoglobin: 0.9 % (ref 0.0–1.5)
O2 Saturation: 41.4 %
O2 Saturation: 47.6 %
Total hemoglobin: 8.6 g/dL — ABNORMAL LOW (ref 12.0–16.0)
Total hemoglobin: 9 g/dL — ABNORMAL LOW (ref 12.0–16.0)

## 2023-09-07 LAB — CBC
HCT: 29.5 % — ABNORMAL LOW (ref 36.0–46.0)
Hemoglobin: 9.1 g/dL — ABNORMAL LOW (ref 12.0–15.0)
MCH: 27.8 pg (ref 26.0–34.0)
MCHC: 30.8 g/dL (ref 30.0–36.0)
MCV: 90.2 fL (ref 80.0–100.0)
Platelets: 725 10*3/uL — ABNORMAL HIGH (ref 150–400)
RBC: 3.27 MIL/uL — ABNORMAL LOW (ref 3.87–5.11)
RDW: 18.6 % — ABNORMAL HIGH (ref 11.5–15.5)
WBC: 25.1 10*3/uL — ABNORMAL HIGH (ref 4.0–10.5)
nRBC: 0.2 % (ref 0.0–0.2)

## 2023-09-07 LAB — PROTIME-INR
INR: 2.5 — ABNORMAL HIGH (ref 0.8–1.2)
Prothrombin Time: 27.4 s — ABNORMAL HIGH (ref 11.4–15.2)

## 2023-09-07 MED ORDER — POTASSIUM CHLORIDE CRYS ER 10 MEQ PO TBCR: 20.0000 meq | EXTENDED_RELEASE_TABLET | Freq: Every day | ORAL | 3 refills | Status: DC

## 2023-09-07 MED ORDER — SPIRONOLACTONE 12.5 MG HALF TABLET
12.5000 mg | ORAL_TABLET | Freq: Every day | ORAL | Status: DC
  Administered 2023-09-07: 12.5 mg via ORAL
  Filled 2023-09-07: qty 1

## 2023-09-07 MED ORDER — WARFARIN SODIUM 5 MG PO TABS: 5.0000 mg | ORAL_TABLET | Freq: Once | ORAL | 0 refills | Status: DC

## 2023-09-07 MED ORDER — PREDNISONE 20 MG PO TABS: ORAL_TABLET | ORAL | 0 refills | Status: DC

## 2023-09-07 MED ORDER — TORSEMIDE 20 MG PO TABS: 60.0000 mg | ORAL_TABLET | Freq: Every day | ORAL | Status: DC

## 2023-09-07 MED ORDER — SPIRONOLACTONE 25 MG PO TABS: 12.5000 mg | ORAL_TABLET | Freq: Every day | ORAL | 3 refills | Status: DC

## 2023-09-07 MED ORDER — TORSEMIDE 60 MG PO TABS: 60.0000 mg | ORAL_TABLET | Freq: Every day | ORAL | 0 refills | Status: DC

## 2023-09-07 MED ORDER — WARFARIN SODIUM 5 MG PO TABS: 5.0000 mg | ORAL_TABLET | Freq: Once | ORAL | Status: DC

## 2023-09-07 NOTE — Progress Notes (Signed)
 PHARMACY - ANTICOAGULATION CONSULT NOTE  Pharmacy Consult for warfarin  Indication:  Mechanical mitral valve replacement  Allergies  Allergen Reactions   Lisinopril Swelling   Acetaminophen-Codeine Itching   Propoxyphene Itching    Darvocet   Tape Rash    Plastic   Tramadol Itching, Nausea And Vomiting and Rash    Patient Measurements: Height: 5\' 5"  (165.1 cm) Weight: 70.5 kg (155 lb 6.4 oz) (Scale A) IBW/kg (Calculated) : 57 Heparin Dosing Weight: TBW  Vital Signs: Temp: 98.1 F (36.7 C) (03/18 0722) Temp Source: Oral (03/18 0722) BP: 151/108 (03/18 0722) Pulse Rate: 84 (03/18 0722)  Labs: Recent Labs    09/05/23 0445 09/06/23 0550 09/07/23 0328  HGB 8.9* 9.1* 9.1*  HCT 28.6* 29.2* 29.5*  PLT 823* 733* 725*  LABPROT 35.9* 33.4* 27.4*  INR 3.6* 3.3* 2.5*  CREATININE 1.66* 1.85* 1.46*    Estimated Creatinine Clearance: 33.8 mL/min (A) (by C-G formula based on SCr of 1.46 mg/dL (H)).   Medical History: Past Medical History:  Diagnosis Date   Acute delirium 08/30/2023   Acute GI bleeding 08/18/2023   Arthritis    Asthma    Breast cancer (HCC) 2001   Left Breast Cancer   CHF (congestive heart failure) (HCC)    COPD (chronic obstructive pulmonary disease) (HCC)    Coronary artery disease    GERD (gastroesophageal reflux disease)    History of mitral valve replacement with mechanical valve    HLD (hyperlipidemia)    Hypertension    Pulmonary embolism (HCC) 2021     Assessment: 31 YOF with hx AFib and mechanical MVR on warfarin (goal INR 2.5-3.5). Warfarin held on admit with melenic stools, heparin started for anticoagulation. EGD 3/4 with no bleeding, heparin and warfarin resumed postop.  INR down but still therapeutic at 2.5, amiodarone stopped. Will discharge on 5mg  daily.  Home dose = 5mg  daily except 7.5mg  Mon/Thurs (5mg  daily per pt report)  Goal of Therapy:  INR goal 2.5-3.5 Monitor platelets by anticoagulation protocol: Yes  Plan:   Warfarin 5mg  daily  Fredonia Highland, PharmD, BCPS, [facility] Clinical Pharmacist [phone] Please check AMION for all [facility] Pharmacy numbers 09/07/2023

## 2023-09-07 NOTE — Discharge Summary (Signed)
 Name: Kim Anthony [name]: [id number] DOB: 1951/05/30 73 y.o. PCP: Kim Lime, MD  Date of Admission: 08/17/2023  7:55 PM Date of Discharge: 09/07/2023 Attending Physician: Dr.  Lafonda Anthony  Discharge Diagnosis: Principal Problem:   Biventricular heart failure with reduced left ventricular function (HCC) Active Problems:   Chronic low back pain   COPD (chronic obstructive pulmonary disease) (HCC)   S/P mitral valve replacement   HFrEF (heart failure with reduced ejection fraction) (HCC)   Warfarin anticoagulation   Atrial fibrillation with RVR (HCC)   CKD stage 3a, GFR 45-59 ml/min (HCC)   Postherpetic neuralgia   QT prolongation   Pulmonary hypertension, low resistance (HCC)   Acute heart failure with reduced ejection fraction (HFrEF, <= 40%) (HCC)   RVF (right ventricular failure) (HCC)   DVT (deep venous thrombosis) (HCC)   Gastrointestinal hemorrhage with melena    Discharge Medications: Allergies as of 09/07/2023       Reactions   Lisinopril Swelling   Acetaminophen-codeine Itching   Propoxyphene Itching   Darvocet   Tape Rash   Plastic   Tramadol Itching, Nausea And Vomiting, Rash        Medication List     STOP taking these medications    HYDROcodone-acetaminophen 5-325 MG tablet Commonly known as: NORCO/VICODIN       TAKE these medications    albuterol 108 (90 Base) MCG/ACT inhaler Commonly known as: VENTOLIN HFA INHALE 2 PUFFS BY MOUTH EVERY 6 HOURS AS NEEDED FOR WHEEZING FOR SHORTNESS OF BREATH   Farxiga 10 MG Tabs tablet Generic drug: dapagliflozin propanediol Take 1 tablet by mouth once daily   fluticasone 50 MCG/ACT nasal spray Commonly known as: Flonase Place 2 sprays into both nostrils daily. What changed: how much to take   gabapentin 800 MG tablet Commonly known as: Neurontin Take 1 tablet (800 mg total) by mouth 3 (three) times daily.   losartan 25 MG tablet Commonly known as: COZAAR Take 0.5 tablets (12.5 mg total) by mouth  daily.   metoprolol succinate 25 MG 24 hr tablet Commonly known as: TOPROL-XL Take 1 tablet (25 mg total) by mouth 2 (two) times daily.   potassium chloride 10 MEQ tablet Commonly known as: KLOR-CON M Take 2 tablets (20 mEq total) by mouth daily. What changed: how much to take   predniSONE 20 MG tablet Commonly known as: DELTASONE Take 2 tablets (40 mg total) by mouth daily with breakfast for 5 days, THEN 1.5 tablets (30 mg total) daily with breakfast for 5 days, THEN 1 tablet (20 mg total) daily with breakfast for 5 days, THEN 0.5 tablets (10 mg total) daily with breakfast for 5 days. Start taking on: September 07, 2023   simvastatin 20 MG tablet Commonly known as: ZOCOR Take 1 tablet (20 mg total) by mouth daily.   spironolactone 25 MG tablet Commonly known as: ALDACTONE Take 0.5 tablets (12.5 mg total) by mouth daily. Dont start this medication until you are seen in the clinic on March 25th Start taking on: September 08, 2023   tetrahydrozoline 0.05 % ophthalmic solution Place 1 drop into both eyes 3 (three) times daily.   Torsemide 60 MG Tabs Take 60 mg by mouth daily. Start taking on: September 08, 2023 What changed:  medication strength how much to take   Trelegy Ellipta 100-62.5-25 MCG/ACT Aepb Generic drug: Fluticasone-Umeclidin-Vilant INHALE 1 PUFF ONCE DAILY INTO  THE  LUNGS   warfarin 5 MG tablet Commonly known as: COUMADIN Take as directed. If you  are unsure how to take this medication, talk to your nurse or doctor. Original instructions: Take 1 tablet (5 mg total) by mouth one time only at 4 PM. What changed:  how much to take how to take this when to take this additional instructions               Durable Medical Equipment  (From admission, onward)           Start     Ordered   09/07/23 1210  For home use only DME oxygen  Once       Comments: POC for COPD/CHF  Question Answer Comment  Length of Need Lifetime   Mode or (Route) Nasal cannula    Liters per Minute 2   Frequency Continuous (stationary and portable oxygen unit needed)   Oxygen conserving device Yes   Oxygen delivery system Gas      09/07/23 1213            Disposition and follow-up:   [name] was discharged from [facility] in Good condition.  At the hospital follow up visit please address:  1.  Follow-up: IMPORTANT FOLLOW UPS   a.For her Heart failure, Cardiology added 12.5 mg of spironolactone. She is already on Torsemide 60 mg daily.Patent has been instructed  not start Spironolactone until she sees her PCP. Kindly repeat a BMP to assess kidney function.Consider initiating Spironolactone if Scr is reassuring at that time.    b. For her mechanical valve, her INR was >10 on warfarin on admission. PLEASE ensure she follows up with Dr Kim Anthony and have her INR rechecked. Please use  a teach back technique to elicit compliance to her medications.   C. For her gout, she is on a prednisone taper.   40 mg for 5 days   30 mg for 5 days  20 mg for 5 days  10 mg for 5 days. Please  reiterate this tapering  2.  Labs / imaging needed at time of follow-up: BMP  3.  Pending labs/ test needing follow-up: No labs are pending    Follow-up Appointments:  Contact information for follow-up providers     [facility] Follow up on 09/29/2023.   Specialty: Cardiology Why: 9:30 am Heart and Vascular Tower Entrance C Contact information: [address] [place] [address] [phone]        Kim Christians, DO Follow up on 09/14/2023.   Specialty: Internal Medicine Contact information: [address] [place] [address] [phone]               Hospital Course by problem list: Upper GI bleed 2/2 gastritis Acute blood loss anemia [name] initially presented to our ED due to concerns for multiple episodes of dark brown stool and abdominal pain, dizziness and  found to be in shock, upgraded to ICU and later downgraded to the cardiac unit floor after she became stable. GI was consulted who scoped her and found no active bleeding but did see evidence of gastritis. She was then stared on PPI and trended her CBC throughout her hospitalization. At discharge, her Hgb was stabilized at 9.1 and she had no signs of bleeding.  Paroxysmal atrial flutter Hx atrial fibrillation Patient presented with tachycardia, found to be in atrial flutter with RVR. Treated with IV amiodarone with good response. Cardiology was consulted who started oral amiodarone, however due to concerns of QT prolongation this was discontinued  and instead home metoprolol 25mg  was increased to 25mg  BID. Patient's HR remained in normal sinus rhythm. Patient will follow up with cardiology to evaluate for recurrent atrial flutter and refer to EP to see if she will benefit from ablation. Discharged on warfarin 5 g daily with recommendation to follow up with Dr Kim Anthony for INR recheck   Acute on chronic biventricular heart failure HFrEF EF 50% Acute hypoxic respiratory failure Patient presented with dyspnea and bilateral LEE, BNP 375, CXR without signs of acute cardiopulmonary process. Required 2L [place]. Echo without significant changes from previous. Patient was given IV Lasix with good urinary output and later transitioned to oral Lasix. Her hospital was complicated with acute and chronic RV failure with low Co-ox. Cardiology assisted and initiated her on milrinone. This helped improve perfusion and was stopped at the appropriate time. She was later switched to torsemide 100mg  which she responded appropriately with good urine output. Ms. defilippo volume status improved significantly and was stabilized for discharge. Her torsemide was decreased to 60 mg daily and Spironolactone was added to her home regimen per cardiology.  AKI CKD stage 4 Creatinine increased to 1.41 from baseline ~1. Likely cardiorenal etiology  2/2 acute on chronic systolic heart failure. Creatinine improved 1.46 on discharge. Repeat BMP recommended at F/U    Post-herpetic neuralgia Home gabapentin continued at reduced dose due to decreased renal function   Hx mitral valve replacement: continued home warfarin. Discharged on Warfarin 5 mg  COPD: Home Breo Ellipta and Incruse Ellipta continued Chronic low back pain: continued home Norco/Vicodin 5-325mg  q6h PRN HTN: held home antihypertensives HLD: continued home simvastatin 20mg  Gout: day 2 of prednisone taper with 5 days 40mg , 5 days 30mg , 5 days 20mg , 5 days 10mg . Total of 20 days. GERD: Continued home protonix 40mg  daily     Discharge Subjective: Patient was seen at bedside laying in bed in no acute distress.  Patient says she is doing well.  She however reports mild pain in her knees bilaterally.  Plans to discharge her today discussed with the patient and she agrees to go home with home health.   Discharge Exam:   BP 135/78   Pulse 84   Temp 97.8 F (36.6 C) (Oral)   Resp 18   Ht 5\' 5"  (1.651 m)   Wt 70.5 kg Comment: Scale A  SpO2 96%   BMI 25.86 kg/m  Constitutional: well-appearing woman, sitting in bed, in no acute distress HENT: normocephalic atraumatic, mucous membranes moist Eyes: conjunctiva non-erythematous Neck: supple Cardiovascular: regular rate and rhythm, no m/r/g Pulmonary/Chest: normal work of breathing on room air, lungs clear to auscultation bilaterally Abdominal: soft, non-tender, non-distended MSK: normal bulk and tone. Mildly swollen right knee compared to the left  Neurological: alert & oriented x 3, 5/5 strength in bilateral upper and lower extremities, normal gait Skin: warm and dry Psych: Normal mood and affect   Pertinent Labs, Studies, and Procedures:     Latest Ref Rng & Units 09/07/2023    3:28 AM 09/06/2023    5:50 AM 09/05/2023    4:45 AM  CBC  WBC 4.0 - 10.5 K/uL 25.1  16.7  17.9   Hemoglobin 12.0 - 15.0 g/dL 9.1  9.1  8.9    Hematocrit 36.0 - 46.0 % 29.5  29.2  28.6   Platelets 150 - 400 K/uL 725  733  823        Latest Ref Rng & Units 09/07/2023    3:28 AM 09/06/2023  5:50 AM 09/05/2023    4:45 AM  CMP  Glucose 70 - 99 mg/dL 161  096  97   BUN 8 - 23 mg/dL 75  90  79   Creatinine 0.44 - 1.00 mg/dL 0.45  4.09  8.11   Sodium 135 - 145 mmol/L 133  130  131   Potassium 3.5 - 5.1 mmol/L 3.9  3.8  3.6   Chloride 98 - 111 mmol/L 98  96  93   CO2 22 - 32 mmol/L 25  24  26    Calcium 8.9 - 10.3 mg/dL 8.6  8.7  8.8     ECHOCARDIOGRAM LIMITED Result Date: 08/18/2023    ECHOCARDIOGRAM LIMITED REPORT   Patient Name:   TANETTA FUHRIMAN Date of Exam: 08/18/2023 Medical Rec #:  [id number]     Height:       65.0 in Accession #:    [id number]    Weight:       112.9 lb Date of Birth:  1950-11-25      BSA:          1.551 m Patient Age:    72 years      BP:           96/53 mmHg Patient Gender: F             HR:           82 bpm. Exam Location:  Inpatient Procedure: Limited Echo, Limited Color Doppler and Cardiac Doppler (Both            Spectral and Color Flow Doppler were utilized during procedure). Indications:    CHF  History:        Patient has prior history of Echocardiogram examinations, most                 recent 07/30/2023. CHF, COPD, Mitral Valve Disease,                 Arrythmias:Atrial Fibrillation and Atrial Flutter; Risk                 Factors:Current Smoker.                  Mitral Valve: St. Jude mechanical valve valve is present in the                 mitral position.  Sonographer:    Amy Chionchio Referring Phys: [id number] CAROLYN GUILLOUD IMPRESSIONS  1. Left ventricular ejection fraction, by estimation, is 55%. The left ventricle has no regional wall motion abnormalities. Left ventricular diastolic parameters are indeterminate.  2. D-shaped interventricular septum suggestive of RV pressure/volume overload. Right ventricular systolic function is mildly reduced. The right ventricular size is severely enlarged.  3. Peak RV-RA  gradient 25 mmHg. The IVC was not visualized.  4. Left atrial size was mildly dilated.  5. Right atrial size was severely dilated.  6. St Jude mechanical mitral valve. Mean gradient 4 mmHg, not significantly elevated. No significant mitral regurgitation noted.  7. The tricuspid valve leaflets do not fully coapt. The tricuspid valve is abnormal. Tricuspid valve regurgitation is severe.  8. The aortic valve is tricuspid. There is mild calcification of the aortic valve. No stenosis or regurgitation noted.  9. Limited echo. FINDINGS  Left Ventricle: Left ventricular ejection fraction, by estimation, is 55%. The left ventricle has no regional wall motion abnormalities. Left ventricular diastolic parameters are indeterminate. Right Ventricle: D-shaped interventricular septum suggestive of RV pressure/volume overload.  The right ventricular size is severely enlarged. Right ventricular systolic function is mildly reduced. Left Atrium: Left atrial size was mildly dilated. Right Atrium: Right atrial size was severely dilated. Mitral Valve: St Jude mechanical mitral valve. Mean gradient 4 mmHg, not significantly elevated. No significant mitral regurgitation noted. There is a St. Jude mechanical valve present in the mitral position. MV peak gradient, 8.0 mmHg. The mean mitral valve gradient is 4.0 mmHg. Tricuspid Valve: The tricuspid valve leaflets do not fully coapt. The tricuspid valve is abnormal. Tricuspid valve regurgitation is severe. Aortic Valve: The aortic valve is tricuspid. There is mild calcification of the aortic valve. Aortic valve regurgitation is not visualized. No aortic stenosis is present. Pulmonic Valve: The pulmonic valve was normal in structure. Pulmonic valve regurgitation is trivial. Aorta: The aortic root is normal in size and structure. LEFT VENTRICLE PLAX 2D LVOT diam:     2.10 cm LV SV:         37 LV SV Index:   24 LVOT Area:     3.46 cm  LV Volumes (MOD) LV vol d, MOD A4C: 49.7 ml LV vol s, MOD A4C:  17.9 ml LV SV MOD A4C:     49.7 ml RIGHT VENTRICLE RV Basal diam:  4.80 cm LEFT ATRIUM           Index        RIGHT ATRIUM           Index LA Vol (A4C): 53.3 ml 34.36 ml/m  RA Area:     41.10 cm                                    RA Volume:   181.00 ml 116.68 ml/m  AORTIC VALVE             PULMONIC VALVE LVOT Vmax:   82.00 cm/s  PV Vmax:          0.81 m/s LVOT Vmean:  50.200 cm/s PV Peak grad:     2.7 mmHg LVOT VTI:    0.108 m     PR End Diast Vel: 4.24 msec  AORTA Ao Asc diam: 3.50 cm MITRAL VALVE MV Area VTI:  1.21 cm   SHUNTS MV Peak grad: 8.0 mmHg   Systemic VTI:  0.11 m MV Mean grad: 4.0 mmHg   Systemic Diam: 2.10 cm MV Vmax:      1.41 m/s MV Vmean:     99.6 cm/s Dalton McleanMD Electronically signed by Wilfred Lacy Signature Date/Time: 08/18/2023/9:55:28 AM    Final    DG Abd 1 View Result Date: 08/18/2023 CLINICAL DATA:  Abdominal discomfort EXAM: ABDOMEN - 1 VIEW COMPARISON:  None available FINDINGS: The bowel gas pattern is normal. No radio-opaque calculi or other significant radiographic abnormality are seen. IMPRESSION: Negative. Electronically Signed   By: Charlett Nose M.D.   On: 08/18/2023 01:26   [place] EKG SITE RITE Result Date: 08/18/2023 If [facility] image not attached, placement could not be confirmed due to current cardiac rhythm.    Discharge Instructions: Discharge Instructions     (HEART FAILURE PATIENTS) Call MD:  Anytime you have any of the following symptoms: 1) 3 pound weight gain in 24 hours or 5 pounds in 1 week 2) shortness of breath, with or without a dry hacking cough 3) swelling in the hands, feet or stomach 4) if you have to sleep on extra pillows  at night in order to breathe.   Complete by: As directed    Call MD for:  difficulty breathing, headache or visual disturbances   Complete by: As directed    Diet - low sodium heart healthy   Complete by: As directed    Discharge instructions   Complete by: As directed    Dear Ms Lorrin Mais, It was a pleasure taking care  of you at [facility]. You were admitted for hypotension  and treated for upper GI bleed and acute on chronic Heart failure.We are discharging you home now that you are doing better. Please follow the following instructions.    To Ms. [name] or their caretakers,  They were admitted to [facility] on 08/17/2023 for evaluation and treatment of:  Principal Problem:   Biventricular heart failure with reduced left ventricular function (HCC) Active Problems:   Chronic low back pain   COPD (chronic obstructive pulmonary disease) (HCC)   S/P mitral valve replacement   HFrEF (heart failure with reduced ejection fraction) (HCC)   Warfarin anticoagulation   Atrial fibrillation with RVR (HCC)   CKD stage 3a, GFR 45-59 ml/min (HCC)   Postherpetic neuralgia   QT prolongation   Pulmonary hypertension, low resistance (HCC)   Acute heart failure with reduced ejection fraction (HFrEF, <= 40%) (HCC)   RVF (right ventricular failure) (HCC)   DVT (deep venous thrombosis) (HCC)  Resolved Problems:   Hypotension   Acute renal failure (HCC)   Anemia   Acute GI bleeding   ABLA (acute blood loss anemia)   Acute delirium   Aspiration pneumonia (HCC)   AKI (acute kidney injury) (HCC)   They were discharged from the hospital on 09/07/23. I recommend the following after leaving the hospital:  1. Please follow up with your primary care physician on March 25 th for a hospital follow up  2. Continue taking all your medication as discussed.Please take the time out to read the attached information on bleeding. 3. Please take torsemide 60 mg daily. Do not take the Spironolactone until you follow up with Dr Sloan Leiter in the Clinic.  4. Your Oxygen saturations was quite low so we are sending you home with home oxygen to use if needed.   Principal Problem:   Acute GI bleeding Active Problems:   Biventricular heart failure with reduced left ventricular function (HCC)   Chronic low  back pain   COPD (chronic obstructive pulmonary disease) (HCC)   S/P mitral valve replacement   Warfarin anticoagulation   Atrial fibrillation with RVR (HCC)   CKD stage 3a, GFR 45-59 ml/min (HCC)   Postherpetic neuralgia   QT prolongation   Pulmonary hypertension, low resistance (HCC)   Acute delirium   Aspiration pneumonia (HCC)  Resolved Problems:   Hypotension   Acute renal failure (HCC)   Anemia   ABLA (acute blood loss anemia)    For questions about your care plan, until you are able to see your primary doctor: Call [phone]. Dial [phone] for the operator. Ask for the internal medicine resident on call.   Take care,  Dr. Kathleen Lime, MD   Increase activity slowly   Complete by: As directed        Signed: Kathleen Lime, MD Internal Medicine Teaching Service Pager [phone]

## 2023-09-07 NOTE — Progress Notes (Addendum)
 SATURATION QUALIFICATIONS: (This note is used to comply with regulatory documentation for home oxygen)  Patient Saturations on Room Air at Rest = 93%  Patient Saturations on Room Air while Ambulating = 86-87%  Patient Saturations on 2 Liters of oxygen while Ambulating = 94%  Please briefly explain why patient needs home oxygen: Patient experiencing shortness during exertion.

## 2023-09-07 NOTE — Progress Notes (Signed)
 Occupational Therapy Treatment Patient Details Name: Kim Anthony [name]: [id number] DOB: July 22, 1950 Today's Date: 09/07/2023   History of present illness Pt is 73 yo presenting to ED 2/25 with melena and symptomatic anemia in setting of GI bleed and chronic coumadin use. 2/28 R heart cath in setting of acute on chronic RV failure and pulmonary hypertension. 3/11 R internal jugular DVT. PMH: gout, CAD, HFrEF, a fib, HTN, GERD, HLD, COPD, hx of breast cancer s/p L mastectomy and radiation/chemo.   OT comments  Pt continues to make good progress towards OT goals. Focused session on safe O2 line mgmt with bathroom mobility using RW. Minor cues needed to problem solve O2 tubing but overall no physical assistance needed for ADLs/mobility during session. Educated re: monitoring O2 w/ pulse ox, good SpO2 readings and use of O2 tank dropped off during session. Continue to recommend HHOT at DC w/ pt in agreement.      If plan is discharge home, recommend the following:  A little help with bathing/dressing/bathroom;Assistance with cooking/housework   Equipment Recommendations  None recommended by OT    Recommendations for Other Services      Precautions / Restrictions Precautions Precautions: Fall Precaution/Restrictions Comments: monitor O2 Restrictions Weight Bearing Restrictions Per Provider Order: No       Mobility Bed Mobility               General bed mobility comments: EOB on entry    Transfers Overall transfer level: Modified independent Equipment used: Rolling walker (2 wheels) Transfers: Sit to/from Stand Sit to Stand: Modified independent (Device/Increase time)                 Balance Overall balance assessment: Needs assistance Sitting-balance support: Feet supported, No upper extremity supported Sitting balance-Leahy Scale: Good     Standing balance support: No upper extremity supported, During functional activity, Bilateral upper extremity  supported Standing balance-Leahy Scale: Fair                             ADL either performed or assessed with clinical judgement   ADL Overall ADL's : Needs assistance/impaired                         Toilet Transfer: Supervision/safety;Ambulation;Regular Toilet;Rolling walker (2 wheels) Toilet Transfer Details (indicate cue type and reason): cues for O2 line mgmt using RW with pt initiating problem solving well. no LOB Toileting- Clothing Manipulation and Hygiene: Modified independent;Sitting/lateral lean;Sit to/from stand Toileting - Clothing Manipulation Details (indicate cue type and reason): managing peri care and clothing w/o issue       General ADL Comments: Emphasis on O2 line mgmt with extension tubing- discussion of holding in hand while using RW vs hooking on RW. pt assisting to problem solve w/ hair tie vs twist tie at home. Educated on how to turn on O2 tank, assessing if full or empty and monitoring O2 with pulse ox at home.    Extremity/Trunk Assessment Upper Extremity Assessment Upper Extremity Assessment: Right hand dominant;RUE deficits/detail RUE Deficits / Details: has prefab wrist cock up splint for gout flare. reports some improvements in wrist/digit motion/pain but discomfort still present, able to use this hand functionally with ADL tasks/mobility today   Lower Extremity Assessment Lower Extremity Assessment: Defer to PT evaluation        Vision   Vision Assessment?: No apparent visual deficits   Perception  Praxis     Communication Communication Communication: No apparent difficulties   Cognition Arousal: Alert Behavior During Therapy: WFL for tasks assessed/performed Cognition: No apparent impairments                               Following commands: Intact        Cueing   Cueing Techniques: Verbal cues  Exercises      Shoulder Instructions       General Comments VSS on supplemental O2     Pertinent Vitals/ Pain       Pain Assessment Pain Assessment: Faces Faces Pain Scale: Hurts a little bit Pain Location: R wrist Pain Descriptors / Indicators: Sore Pain Intervention(s): Monitored during session  Home Living                                          Prior Functioning/Environment              Frequency  Min 1X/week        Progress Toward Goals  OT Goals(current goals can now be found in the care plan section)  Progress towards OT goals: Progressing toward goals  Acute Rehab OT Goals Patient Stated Goal: home soon OT Goal Formulation: With patient Time For Goal Achievement: 09/20/23 Potential to Achieve Goals: Good ADL Goals Pt Will Perform Lower Body Bathing: sit to/from stand;sitting/lateral leans;with set-up Pt Will Perform Lower Body Dressing: with set-up;sit to/from stand;sitting/lateral leans Pt Will Transfer to Toilet: with set-up;ambulating Pt Will Perform Toileting - Clothing Manipulation and hygiene: with set-up;sitting/lateral leans;sit to/from stand  Plan      Co-evaluation                 AM-PAC OT "6 Clicks" Daily Activity     Outcome Measure   Help from another person eating meals?: None Help from another person taking care of personal grooming?: A Little Help from another person toileting, which includes using toliet, bedpan, or urinal?: A Little Help from another person bathing (including washing, rinsing, drying)?: A Little Help from another person to put on and taking off regular upper body clothing?: A Little Help from another person to put on and taking off regular lower body clothing?: A Little 6 Click Score: 19    End of Session Equipment Utilized During Treatment: Rolling walker (2 wheels);Oxygen  OT Visit Diagnosis: Unsteadiness on feet (R26.81);Other abnormalities of gait and mobility (R26.89);Muscle weakness (generalized) (M62.81)   Activity Tolerance Patient tolerated treatment well    Patient Left in bed;with call bell/phone within reach   Nurse Communication Mobility status;Other (comment) (pt asking about insulin needs at home)        Time: 1240-1306 OT Time Calculation (min): 26 min  Charges: OT General Charges $OT Visit: 1 Visit OT Treatments $Self Care/Home Management : 23-37 mins  Bradd Canary, OTR/L Acute Rehab Services Office: [phone]   Lorre Munroe 09/07/2023, 1:55 PM

## 2023-09-07 NOTE — Discharge Instructions (Addendum)
 Dear Ms Nappier, It was a pleasure taking care of you at [facility]. You were admitted for hypotension  and treated for upper GI bleed and acute on chronic Heart failure.We are discharging you home now that you are doing better. Please follow the following instructions.    To Ms. [name] or their caretakers,  They were admitted to [facility] on 08/17/2023 for evaluation and treatment of:  Principal Problem:   Biventricular heart failure with reduced left ventricular function (HCC) Active Problems:   Chronic low back pain   COPD (chronic obstructive pulmonary disease) (HCC)   S/P mitral valve replacement   HFrEF (heart failure with reduced ejection fraction) (HCC)   Warfarin anticoagulation   Atrial fibrillation with RVR (HCC)   CKD stage 3a, GFR 45-59 ml/min (HCC)   Postherpetic neuralgia   QT prolongation   Pulmonary hypertension, low resistance (HCC)   Acute heart failure with reduced ejection fraction (HFrEF, <= 40%) (HCC)   RVF (right ventricular failure) (HCC)   DVT (deep venous thrombosis) (HCC)  Resolved Problems:   Hypotension   Acute renal failure (HCC)   Anemia   Acute GI bleeding   ABLA (acute blood loss anemia)   Acute delirium   Aspiration pneumonia (HCC)   AKI (acute kidney injury) (HCC)   They were discharged from the hospital on 09/07/23. I recommend the following after leaving the hospital:  Please follow up with your primary care physician on March 25 th for a hospital follow up  Continue taking all your medication as discussed.Please take the time out to read the attached information on bleeding. Please take torsemide 60 mg daily. Do not take the Spironolactone until you follow up with Dr Sloan Leiter in the Clinic.   Principal Problem:   Acute GI bleeding Active Problems:   Biventricular heart failure with reduced left ventricular function (HCC)   Chronic low back pain   COPD (chronic obstructive pulmonary disease) (HCC)    S/P mitral valve replacement   Warfarin anticoagulation   Atrial fibrillation with RVR (HCC)   CKD stage 3a, GFR 45-59 ml/min (HCC)   Postherpetic neuralgia   QT prolongation   Pulmonary hypertension, low resistance (HCC)   Acute delirium   Aspiration pneumonia (HCC)  Resolved Problems:   Hypotension   Acute renal failure (HCC)   Anemia   ABLA (acute blood loss anemia)    For questions about your care plan, until you are able to see your primary doctor: Call [phone]. Dial [phone] for the operator. Ask for the internal medicine resident on call.  Kathleen Lime ,MD 09/07/2023, 8:06 AM    Take care,  Dr. Kathleen Lime, MD

## 2023-09-07 NOTE — TOC Progression Note (Signed)
 Transition of Care [facility]) - Progression Note    Patient Details  Name: Kim Anthony [name]: [id number] Date of Birth: December 21, 1950  [facility] CM/SW Contact  Nicanor Bake Phone Number: [phone] 09/07/2023, 10:12 AM  Clinical Narrative:  8:27 AM: HF CSW received a call from pts daughter stating that she was ok with the pt dc to [facility]. CSW shared updates that the pts auth was still pending and will updates when  the status changes. Pts daughter requested a room with a shower. CSW explained that she is unsure how the facility admissions and room decision process works.   TOC will continue following.         Barriers to Discharge: Continued Medical Work up  Expected Discharge Plan and Services       Living arrangements for the past 2 months: Single Family Home                                       Social Determinants of Health (SDOH) Interventions SDOH Screenings   Food Insecurity: No Food Insecurity (08/18/2023)  Housing: Low Risk  (08/18/2023)  Transportation Needs: No Transportation Needs (08/18/2023)  Utilities: Not At Risk (08/18/2023)  Alcohol Screen: Low Risk  (12/01/2022)  Depression (PHQ2-9): Low Risk  (12/01/2022)  Financial Resource Strain: Low Risk  (12/01/2022)  Physical Activity: Inactive (12/01/2022)  Social Connections: Patient Declined (08/18/2023)  Stress: Stress Concern Present (12/01/2022)  Tobacco Use: High Risk (08/24/2023)    Readmission Risk Interventions    02/19/2022    3:36 PM  Readmission Risk Prevention Plan  Transportation Screening Complete  PCP or Specialist Appt within 3-5 Days Complete  HRI or Home Care Consult Complete  Social Work Consult for Recovery Care Planning/Counseling Complete  Palliative Care Screening Not Applicable  Medication Review Oceanographer) Complete

## 2023-09-07 NOTE — Progress Notes (Addendum)
 Advanced Heart Failure Rounding Note  Cardiologist: Arvilla Meres, MD   Chief Complaint: Knee pain.  Subjective:   3/14: Milrinone and amio stopped. Switched to torsemide 100 mg daily 3/17: Creatinine bump. Torsemide held. Prednisone restarted.   CO-OX 41%.  Repeat now.  Creatinine trending back down 1.9>1.46  Denies SOB. Complaining of knee pain.     Objective:    Weight Range: 70.5 kg Body mass index is 25.86 kg/m.   Vital Signs:   Temp:  [96.7 F (35.9 C)-98.7 F (37.1 C)] 97.8 F (36.6 C) (03/18 0317) Pulse Rate:  [85-88] 88 (03/17 1647) Resp:  [18] 18 (03/18 0139) BP: (90-137)/(64-99) 133/99 (03/18 0317) SpO2:  [93 %-95 %] 93 % (03/18 0317) Weight:  [70.5 kg] 70.5 kg (03/18 0317) Last BM Date : 09/06/23  Weight change: Filed Weights   09/05/23 0714 09/06/23 0610 09/07/23 0317  Weight: 68.8 kg 69.6 kg 70.5 kg   Intake/Output:   Intake/Output Summary (Last 24 hours) at 09/07/2023 0714 Last data filed at 09/07/2023 4098 Gross per 24 hour  Intake 400 ml  Output 1600 ml  Net -1200 ml     CVP 9-10 unchanged from yesterday.  Physical Exam  General:   No resp difficulty Neck: supple. JVP 8-9 .  Cor: PMI nondisplaced. Regular rate & rhythm. No rubs, gallops. + TR  Lungs: clear Abdomen: soft, nontender, nondistended.  Extremities: no cyanosis, clubbing, rash, edema Neuro: alert & oriented x3  Telemetry   SR 80s   EKG    N/A  Labs    CBC Recent Labs    09/06/23 0550 09/07/23 0328  WBC 16.7* 25.1*  HGB 9.1* 9.1*  HCT 29.2* 29.5*  MCV 89.6 90.2  PLT 733* 725*   Basic Metabolic Panel Recent Labs    11/91/47 0550 09/07/23 0328  NA 130* 133*  K 3.8 3.9  CL 96* 98  CO2 24 25  GLUCOSE 121* 159*  BUN 90* 75*  CREATININE 1.85* 1.46*  CALCIUM 8.7* 8.6*    BNP (last 3 results) Recent Labs    08/26/23 0404 08/27/23 0458 08/28/23 0414  BNP 375.3* 598.1* 589.0*   Other results:  Medications:    Scheduled Medications:   Chlorhexidine Gluconate Cloth  6 each Topical Daily   dapagliflozin propanediol  10 mg Oral Daily   feeding supplement  237 mL Oral BID BM   fluticasone furoate-vilanterol  1 puff Inhalation Daily   And   umeclidinium bromide  1 puff Inhalation Daily   gabapentin  100 mg Oral TID   insulin aspart  0-15 Units Subcutaneous Q4H   pantoprazole  40 mg Oral Daily   predniSONE  40 mg Oral Q breakfast   QUEtiapine  25 mg Oral QHS   Warfarin - Pharmacist Dosing Inpatient   Does not apply q1600    Infusions:    PRN Medications: acetaminophen, diphenhydrAMINE-zinc acetate, mouth rinse, oxyCODONE  Patient Profile   Kim Anthony is a 73 y.o. female with a history of mechanical MVR in 2001 in [place], [place], CAD, hx breast cancer s/p left mastectomy and radiation/chemo, tobacco use, COPD, prior PE/DVT, HTN, undergoing workup for possible inflammatory arthritis with Rheumatology, and systolic CHF.   Assessment/Plan   1. Acute on chronic RV Failure HFimpEF Pulmonary HTN  - Suspect likely underlying OSA contributing to PH/RV failure. Also w/ known COPD. - Echo this admit EF 55% and D-shaped septum, RV-RA gradient 25 mmHg and severe TR (does not coapt) - NYHA III-IV on  admit - Off Milrinone 72 hours. CO-OX 41%. Repeat CO-OX now.   - No bb with low output.  - CVP 9-10. Prior to admit she was taking torsemide 100 mg daily. Tomorrow would start torsemide 60 mg daily.  - Continue farxiga 10 mg daily  - Add spiro 12.5 mg daily    2. AKI - in the setting of RV congestion/low output - SCr baseline  0.8 - Scr Trending back down 1.88>1.5   - Continue Farxiga 10 mg daily. Given 1st dose 3/14  3. PAF - She does not do well in AFL/AF. - Recent admit 2/25 with AFL, converted to NSR on amio, but then stopped d/t long QT - Warfarin has been restarted, INR 2.5 - Maintaining SR.  Now off amiodarone.    4. CAD  Elevated Troponin - 1 V CAD with occluded mid RCA on cath (7/22) - hsTrop 113>121>198.  Likely demand ischemia, decongest and follow - No chest pain.  - continue statin  5. GI bleed Anemia  - hypotension with melena requiring RBCs - EGD 3/04 with gastritis, no other significant findings - Hg stable.     6. Hx of mechanical mitral valve replacement: - Mechanical MVR in [place] in 2001. - Stable on echo 2/25 MV gradient 3 - INR 2.5  today. Continue Warfarin. Discussed with Pharm D.  - Continue SBE prophylaxis.   7. Tricuspid valve regurgitation - severe on echo 2/25 - Follow    8. Right hand/wrist/Knee pain  Hx gout - Takes prednisone and NSAIDs at home - Suspect acute flare. Uric acid was elevated at 7.7.  -  colchicine discontinued due to diarrhea  - 3/17 Back on prednisone per primary team.   9. Hyponatremia - Hypervolemic hyponatremia - 133  today  10. ID - ? Aspiration PNA.  - Completed antibiotics.  - WBC up 25. Prednisone restarted 3/17.    11. Hypokalemia - K3.9   12. Diarrhea - resolved w/ discontinuation of colchicine   Repeat CO-OX now-->47%. Ok for [employer] per Dr Gala Romney   Heart failure team will sign off as of 09/07/23  HF Team Medication Recommendations for Home: 3/19 Torsemide 60 mg daily Faxiga 10 mg daily  Spironolactone 12.5 mg daily. Coumadin 5 mg daily.      Length of Stay: 20  Amy Clegg NP-C  09/07/2023   Patient seen and examined with the above-signed Advanced Practice Provider and/or Housestaff. I personally reviewed laboratory data, imaging studies and relevant notes. I independently examined the patient and formulated the important aspects of the plan. I have edited the note to reflect any of my changes or salient points. I have personally discussed the plan with the patient and/or family.  Feels fine. Eager to go home. Co-ox marginal but asymptomatic. Volume ok.   No CP or SOB  No bleeding  General:  Elderly  No resp difficulty HEENT: normal Neck: supple. no JVD. Carotids 2+ bilat; no bruits. No lymphadenopathy or  thryomegaly appreciated. Cor: PMI nondisplaced. Regular rate & rhythm. Mechanical s1 2/6 TR Lungs: clear Abdomen: soft, nontender, nondistended. No hepatosplenomegaly. No bruits or masses. Good bowel sounds. Extremities: no cyanosis, clubbing, rash, edema Neuro: alert & orientedx3, cranial nerves grossly intact. moves all 4 extremities w/o difficulty. Affect pleasant  This is as probably as good as we can get her. Co-ox marginal but stable.   Ok for d/c today from HF standpoint. Start torsemide 60 daily. Will arrange f/u in Clinic.   INR 2.5 Discussed warfarin dosing  with PharmD personally.  Arvilla Meres, MD  1:12 PM

## 2023-09-07 NOTE — Progress Notes (Signed)
 Physical Therapy Treatment Patient Details Name: Kim Anthony [name]: [id number] DOB: [birth year] Today's Date: 09/07/2023   History of Present Illness Pt is 73 yo presenting to ED 2/25 with melena and symptomatic anemia in setting of GI bleed and chronic coumadin use. 2/28 R heart cath in setting of acute on chronic RV failure and pulmonary hypertension. 3/11 R internal jugular DVT. PMH: gout, CAD, HFrEF, a fib, HTN, GERD, HLD, COPD, hx of breast cancer s/p L mastectomy and radiation/chemo.    PT Comments  Pt resting in bed on arrival and agreeable to session with continued progress towards acute goals. Pt demonstrating bed mobility, transfers and gait with RW for support with grossly supervision assist for safety. Pt demonstrating increased activity tolerance progressing gait distance this session with x1 standing rest break due to fatigue. Pt was educated on continued walker use to maximize functional independence, safety, and decrease risk for falls. Pt continues to benefit from skilled PT services to progress toward functional mobility goals.      If plan is discharge home, recommend the following: Assistance with cooking/housework;Assist for transportation;Help with stairs or ramp for entrance;A little help with walking and/or transfers   Can travel by private vehicle     Yes  Equipment Recommendations  None recommended by PT    Recommendations for Other Services       Precautions / Restrictions Precautions Precautions: Fall Recall of Precautions/Restrictions: Impaired Precaution/Restrictions Comments: monitor O2 Restrictions Weight Bearing Restrictions Per Provider Order: No     Mobility  Bed Mobility Overal bed mobility: Needs Assistance Bed Mobility: Supine to Sit     Supine to sit: Supervision     General bed mobility comments: slightly increased time to complete, use of bed features    Transfers Overall transfer level: Needs assistance Equipment used: Rolling  walker (2 wheels) Transfers: Sit to/from Stand Sit to Stand: Supervision           General transfer comment: for safety    Ambulation/Gait Ambulation/Gait assistance: Supervision Gait Distance (Feet): 340 Feet Assistive device: Rolling walker (2 wheels) Gait Pattern/deviations: Step-through pattern, Decreased step length - right, Decreased step length - left, Shuffle, Trunk flexed Gait velocity: decr     General Gait Details: cues for closer RW proximity and upright trunk, x1 standing rest due to fatigue, no overt LOB   Stairs             Wheelchair Mobility     Tilt Bed    Modified Rankin (Stroke Patients Only)       Balance Overall balance assessment: Needs assistance Sitting-balance support: Feet supported, No upper extremity supported Sitting balance-Leahy Scale: Good Sitting balance - Comments: Pt able to weight shift and perform functional bending/reaching to thread BLE through clothing. No LOB observed   Standing balance support: No upper extremity supported Standing balance-Leahy Scale: Poor Standing balance comment: able to maintain standing without UE support for peri-care                            Communication Communication Communication: No apparent difficulties  Cognition Arousal: Alert Behavior During Therapy: WFL for tasks assessed/performed   PT - Cognitive impairments: No apparent impairments                         Following commands: Intact Following commands impaired: Follows one step commands with increased time, Only follows one step commands consistently  Cueing Cueing Techniques: Verbal cues  Exercises      General Comments General comments (skin integrity, edema, etc.): VSS on supplemental O2      Pertinent Vitals/Pain Pain Assessment Pain Assessment: Faces Faces Pain Scale: Hurts a little bit Pain Location: L knee Pain Descriptors / Indicators: Sore, Aching Pain Intervention(s):  Premedicated before session, Monitored during session, Limited activity within patient's tolerance    Home Living                          Prior Function            PT Goals (current goals can now be found in the care plan section) Acute Rehab PT Goals Patient Stated Goal: to return home PT Goal Formulation: With patient Time For Goal Achievement: 09/08/23 Progress towards PT goals: Progressing toward goals    Frequency    Min 3X/week      PT Plan      Co-evaluation              AM-PAC PT "6 Clicks" Mobility   Outcome Measure  Help needed turning from your back to your side while in a flat bed without using bedrails?: None Help needed moving from lying on your back to sitting on the side of a flat bed without using bedrails?: A Little Help needed moving to and from a bed to a chair (including a wheelchair)?: A Little Help needed standing up from a chair using your arms (e.g., wheelchair or bedside chair)?: A Little Help needed to walk in hospital room?: A Little Help needed climbing 3-5 steps with a railing? : A Little 6 Click Score: 19    End of Session Equipment Utilized During Treatment: Oxygen Activity Tolerance: Patient tolerated treatment well Patient left: with call bell/phone within reach;in chair;with chair alarm set Nurse Communication: Mobility status PT Visit Diagnosis: Other abnormalities of gait and mobility (R26.89);Muscle weakness (generalized) (M62.81)     Time: 4098-1191 PT Time Calculation (min) (ACUTE ONLY): 17 min  Charges:    $Gait Training: 8-22 mins PT General Charges $$ ACUTE PT VISIT: 1 Visit                     Kim Anthony R. PTA Acute Rehabilitation Services Office: [phone]   Kim Anthony 09/07/2023, 11:31 AM

## 2023-09-07 NOTE — Progress Notes (Signed)
Went over discharge paperwork with patient. All questions answered. All belongings at bedside.  ?

## 2023-09-07 NOTE — TOC Transition Note (Signed)
 Transition of Care [facility]) - Discharge Note   Patient Details  Name: Kim Anthony [name]: [id number] Date of Birth: 01-08-1951  Transition of Care [facility]) CM/SW Contact:  Elliot Cousin, RN Phone Number: [phone] 09/07/2023, 2:18 PM   Clinical Narrative:     TOC CM spoke to pt at bedside. Gave permission to speak to dtr. Home Health medicare.gov list with rating placed on chart and given to pt. Placed [place] Medicaid PCS form in dc package for pt's PCP to complete. Contacted [employer] rep, Ashley for oxygen. Contacted [facility] rep, Clifton Custard for [facility]. Pt and dtr has decided to dc home with [facility].   Final next level of care: Home w Home Health Services Barriers to Discharge: No Barriers Identified   Patient Goals and CMS Choice Patient states their goals for this hospitalization and ongoing recovery are:: wants to get better CMS Medicare.gov Compare Post Acute Care list provided to:: Patient Represenative (must comment) Choice offered to / list presented to : Adult Children      Discharge Placement                       Discharge Plan and Services Additional resources added to the After Visit Summary for     Discharge Planning Services: CM Consult Post Acute Care Choice: Home Health          DME Arranged: Oxygen DME Agency: Patsy Lager Date DME Agency Contacted: 09/07/23 Time DME Agency Contacted: [phone] Representative spoke with at DME Agency: Morrie Sheldon HH Arranged: RN, PT, Nurse's Aide HH Agency: [facility] Date [facility] Agency Contacted: 09/07/23 Time HH Agency Contacted: 1418 Representative spoke with at [facility] Agency: Clifton Custard  Social Drivers of Health (SDOH) Interventions SDOH Screenings   Food Insecurity: No Food Insecurity (08/18/2023)  Housing: Low Risk  (08/18/2023)  Transportation Needs: No Transportation Needs (08/18/2023)  Utilities: Not At Risk (08/18/2023)  Alcohol Screen: Low Risk  (12/01/2022)  Depression (PHQ2-9): Low Risk  (12/01/2022)  Financial Resource Strain:  Low Risk  (12/01/2022)  Physical Activity: Inactive (12/01/2022)  Social Connections: Patient Declined (08/18/2023)  Stress: Stress Concern Present (12/01/2022)  Tobacco Use: High Risk (08/24/2023)     Readmission Risk Interventions    02/19/2022    3:36 PM  Readmission Risk Prevention Plan  Transportation Screening Complete  PCP or Specialist Appt within 3-5 Days Complete  HRI or Home Care Consult Complete  Social Work Consult for Recovery Care Planning/Counseling Complete  Palliative Care Screening Not Applicable  Medication Review Oceanographer) Complete

## 2023-09-07 NOTE — TOC Transition Note (Signed)
 Transition of Care [facility]) - Discharge Note   Patient Details  Name: Kim Anthony [name]: [id number] Date of Birth: June 04, 1951  Transition [facility]) CM/SW Contact:  Nicanor Bake Phone Number: 09/07/2023, 11:08 AM   Clinical Narrative:  Per MD and PT patient is now appropriate for [facility] services.   10:54 AM- HF CSW and HF NCM called the pts daughter to discuss updated plan. Pts daughter agreed to [facility] services. Pts daughter will be providing transportation at dc.   Hopsital follow up appointment scheudled for Friday, September 17, 2023 at 10:50 AM.       Barriers to Discharge: Continued Medical Work up   Patient Goals and CMS Choice            Discharge Placement                       Discharge Plan and Services Additional resources added to the After Visit Summary for                                       Social Drivers of Health (SDOH) Interventions SDOH Screenings   Food Insecurity: No Food Insecurity (08/18/2023)  Housing: Low Risk  (08/18/2023)  Transportation Needs: No Transportation Needs (08/18/2023)  Utilities: Not At Risk (08/18/2023)  Alcohol Screen: Low Risk  (12/01/2022)  Depression (PHQ2-9): Low Risk  (12/01/2022)  Financial Resource Strain: Low Risk  (12/01/2022)  Physical Activity: Inactive (12/01/2022)  Social Connections: Patient Declined (08/18/2023)  Stress: Stress Concern Present (12/01/2022)  Tobacco Use: High Risk (08/24/2023)     Readmission Risk Interventions    02/19/2022    3:36 PM  Readmission Risk Prevention Plan  Transportation Screening Complete  PCP or Specialist Appt within 3-5 Days Complete  HRI or Home Care Consult Complete  Social Work Consult for Recovery Care Planning/Counseling Complete  Palliative Care Screening Not Applicable  Medication Review Oceanographer) Complete

## 2023-09-08 ENCOUNTER — Telehealth: Payer: Self-pay | Admitting: *Deleted

## 2023-09-08 NOTE — Transitions of Care (Post Inpatient/ED Visit) (Signed)
 09/08/2023  Name: Kim Anthony [name]: [id number] DOB: 06-29-1950  Today's TOC FU Call Status: Today's TOC FU Call Status:: Successful TOC FU Call Completed TOC FU Call Complete Date: 09/08/23 Patient's Name and Date of Birth confirmed.  Transition Care Management Follow-up Telephone Call Date of Discharge: 09/07/23 Discharge Facility: [facility]) Type of Discharge: Inpatient Admission Primary Inpatient Discharge Diagnosis:: Biventricular heart failure How have you been since you were released from the hospital?: Better Any questions or concerns?: Yes Patient Questions/Concerns:: On the discharge instructions the hospitalist said stop taking hydrocodone-acetaminophen. Per patient she has been on it for years and going to the pain clinic Patient Questions/Concerns Addressed: Other: (Patient will contact her Dr)  Items Reviewed: Did you receive and understand the discharge instructions provided?: Yes Medications obtained,verified, and reconciled?: Yes (Medications Reviewed) Any new allergies since your discharge?: No Dietary orders reviewed?: No Do you have support at home?: Yes People in Home: alone Name of Support/Comfort Primary Source: [facility] daughter  Medications Reviewed Today: Medications Reviewed Today     Reviewed by Luella Cook, RN (Case Manager) on 09/08/23 at 1440  Med List Status: <None>   Medication Order Taking? Sig Documenting Provider Last Dose Status Informant  albuterol (VENTOLIN HFA) 108 (90 Base) MCG/ACT inhaler [id number] Yes INHALE 2 PUFFS BY MOUTH EVERY 6 HOURS AS NEEDED FOR WHEEZING FOR SHORTNESS OF Britt Bolognese, MD Taking Active Self, Pharmacy Records, Child  FARXIGA 10 [place] TABS tablet [id number] Yes Take 1 tablet by mouth once daily Kathleen Lime, MD Taking Active   fluticasone [facility]) 50 MCG/ACT nasal spray [id number] Yes Place 2 sprays into both nostrils daily.  Patient taking differently: Place 1 spray into both nostrils daily.    Kathleen Lime, MD Taking Active Self, Pharmacy Records, Child  gabapentin (NEURONTIN) 800 MG tablet [id number] Yes Take 1 tablet (800 mg total) by mouth 3 (three) times daily. Lovorn, Aundra Millet, MD Taking Active Self, Pharmacy Records, Child           Med Note (LEE, NICOLE   Wed Aug 18, 2023  1:31 AM) Needs a refill  losartan (COZAAR) 25 MG tablet [id number] Yes Take 0.5 tablets (12.5 mg total) by mouth daily. Milford, Anderson Malta, FNP Taking Active Self, Pharmacy Records, Child  metoprolol succinate (TOPROL-XL) 25 MG 24 hr tablet [id number] Yes Take 1 tablet (25 mg total) by mouth 2 (two) times daily. Monna Fam, MD Taking Active Self, Pharmacy Records, Child  potassium chloride [place] Dura M) 10 MEQ tablet [id number] Yes Take 2 tablets (20 mEq total) by mouth daily. Kathleen Lime, MD Taking Active   predniSONE (DELTASONE) 20 MG tablet [id number] Yes Take 2 tablets (40 mg total) by mouth daily with breakfast for 5 days, THEN 1.5 tablets (30 mg total) daily with breakfast for 5 days, THEN 1 tablet (20 mg total) daily with breakfast for 5 days, THEN 0.5 tablets (10 mg total) daily with breakfast for 5 days. Kathleen Lime, MD Taking Active   simvastatin (ZOCOR) 20 MG tablet [id number] Yes Take 1 tablet (20 mg total) by mouth daily. Willette Cluster, MD Taking Active Self, Pharmacy Records, Child  spironolactone (ALDACTONE) 25 MG tablet [id number] Yes Take 0.5 tablets (12.5 mg total) by mouth daily. Dont start this medication until you are seen in the clinic on March 25th Amoako, Prince, MD Taking Active   tetrahydrozoline 0.05 % ophthalmic solution [id number] Yes Place 1 drop into both eyes 3 (three) times daily. [provider] Taking Active Self, Pharmacy Records, Child  torsemide 60 MG TABS [id number] Yes Take 60 mg by mouth daily. Kathleen Lime, MD Taking Active   TRELEGY ELLIPTA 100-62.5-25 MCG/ACT AEPB [id number] Yes INHALE 1 PUFF ONCE DAILY INTO  THE  LUNGS Kathleen Lime, MD Taking Active Self, Pharmacy  Records, Child  warfarin (COUMADIN) 5 MG tablet [id number] Yes Take 1 tablet (5 mg total) by mouth one time only at 4 PM. Kathleen Lime, MD Taking Active             Home Care and Equipment/Supplies: Were Home Health Services Ordered?: Yes Name of Home Health Agency:: [facility] Has Agency set up a time to come to your home?: No EMR reviewed for Home Health Orders: Orders present/patient has not received call (refer to CM for follow-up) (Patient will call [facility] and see when they are coming) Any new equipment or medical supplies ordered?: Yes Name of Medical supply agency?: [facility] Were you able to get the equipment/medical supplies?: Yes Do you have any questions related to the use of the equipment/supplies?: Yes What questions do you have?: Per patient they came yesterday and it stopped working. She has called them and they said they would be there today. She has other oxygen tanks available until they come.  Functional Questionnaire: Do you need assistance with bathing/showering or dressing?: No Do you need assistance with meal preparation?: Yes Do you need assistance with eating?: No Do you have difficulty maintaining continence: No Do you need assistance with getting out of bed/getting out of a chair/moving?: No Do you have difficulty managing or taking your medications?: No  Follow up appointments reviewed: PCP Follow-up appointment confirmed?: Yes Date of PCP follow-up appointment?: 09/14/23 Follow-up Provider: [id number] Katie Masters/ [id number] [facility] Follow-up appointment confirmed?: Yes Date of Specialist follow-up appointment?: 09/29/23 Follow-Up Specialty Provider:: [id number] Cardiology Do you need transportation to your follow-up appointment?: No Do you understand care options if your condition(s) worsen?: Yes-patient verbalized understanding  SDOH Interventions Today    Flowsheet Row Most Recent Value  SDOH Interventions   Food  Insecurity Interventions Intervention Not Indicated  Housing Interventions Intervention Not Indicated  Transportation Interventions Patient Resources (Friends/Family), Intervention Not Indicated  Utilities Interventions Intervention Not Indicated       Goals Addressed             This Visit's Progress    TOC Care Plan       Current Barriers:  Knowledge Deficits related to plan of care for management of CHF   RNCM Clinical Goal(s):  Patient will work with the Care Management team over the next 30 days to address Transition of Care Barriers: Medication Management take all medications exactly as prescribed and will call provider for medication related questions as evidenced by Electronic Medical Record attend all scheduled medical appointments: PCP and Specialist as evidenced by Electronic Medical Records  through collaboration with RN Care manager, provider, and care team.   Interventions: Evaluation of current treatment plan related to  self management and patient's adherence to plan as established by provider   Heart Failure Interventions:  (Status:  New goal.) Short Term Goal Assessed need for readable accurate scales in home Provided education about placing scale on hard, flat surface Advised patient to weigh each morning after emptying bladder Discussed importance of daily weight and advised patient to weigh and record daily Reviewed role of diuretics in prevention of fluid overload and management of heart failure; Discussed the importance of keeping all appointments with provider  Patient Goals/Self-Care Activities: Participate in  Transition of Care Program/Attend TOC scheduled calls Notify RN Care Manager of TOC call rescheduling needs Take all medications as prescribed Attend all scheduled provider appointments Call pharmacy for medication refills 3-7 days in advance of running out of medications Perform all self care activities independently  Call provider office for  new concerns or questions  call office if I gain more than 2 pounds in one day or 5 pounds in one week track weight in diary watch for swelling in feet, ankles and legs every day weigh myself daily track symptoms and what helps feel better or worse  Follow Up Plan:  Telephone follow up appointment with care management team member scheduled for:  09/16/2023 1:00 Hilbert Odor The patient has been provided with contact information for the care management team and has been advised to call with any health related questions or concerns.  Next PCP appointment scheduled for:  Rudene Christians [id number]          Interventions Today    Flowsheet Row Most Recent Value  Chronic Disease   Chronic disease during today's visit Congestive Heart Failure (CHF)  [Biventricular heart failure]  General Interventions   General Interventions Discussed/Reviewed Doctor Visits, Referral to Nurse, Communication with  Doctor Visits Discussed/Reviewed Doctor Visits Discussed, Doctor Visits Reviewed  Communication with RN  Hilbert Odor RN Case Manager]  Education Interventions   Education Provided Provided Education  Provided Verbal Education On Insurance Plans, Other  [RN discussed the importance of weighing each morning and documenting. RN discussed taking  diuretics as per ordered]  Pharmacy Interventions   Pharmacy Dicussed/Reviewed Pharmacy Topics Discussed, Pharmacy Topics Reviewed      Patient consented to follow up with Hilbert Odor Case Manager RN discussed holding the spirolactone until see PCP 03/25   Gean Maidens BSN RN [facility] Care Management Coordinator [email or web address] Direct Dial: [phone]  Fax: [phone] Website: [email or web address]

## 2023-09-08 NOTE — Progress Notes (Signed)
 Ibuprofen and warfarin contributed to the gastritis with bleeding

## 2023-09-10 ENCOUNTER — Other Ambulatory Visit: Payer: Self-pay | Admitting: Student

## 2023-09-10 DIAGNOSIS — J449 Chronic obstructive pulmonary disease, unspecified: Secondary | ICD-10-CM

## 2023-09-10 DIAGNOSIS — J441 Chronic obstructive pulmonary disease with (acute) exacerbation: Secondary | ICD-10-CM

## 2023-09-10 NOTE — Telephone Encounter (Signed)
 Copied from CRM [id number]. Topic: Clinical - Medication Refill >> Sep 10, 2023 10:16 AM Thomes Dinning wrote: Most Recent Primary Care Visit:  Provider: Hulen Luster B  Department: [facility]  Visit Type: COUMADIN CLINIC  Date: 04/26/2023  Medication:  albuterol (VENTOLIN HFA) 108 (90 Base) MCG/ACT inhaler gabapentin (NEURONTIN) 800 MG tablet TRELEGY ELLIPTA 100-62.5-25 MCG/ACT AEPB  Has the patient contacted their pharmacy? No (Agent: If no, request that the patient contact the pharmacy for the refill. If patient does not wish to contact the pharmacy document the reason why and proceed with request.) (Agent: If yes, when and what did the pharmacy advise?)  Is this the correct pharmacy for this prescription? Yes If no, delete pharmacy and type the correct one.  This is the patient's preferred pharmacy:  [facility] [address] [place], [place] - [address] [place] [address] Phone: [phone] Fax: [phone]   Has the prescription been filled recently? No  Is the patient out of the medication? Yes  Has the patient been seen for an appointment in the last year OR does the patient have an upcoming appointment? Yes  Can we respond through MyChart? Yes  Agent: Please be advised that Rx refills may take up to 3 business days. We ask that you follow-up with your pharmacy.

## 2023-09-11 ENCOUNTER — Other Ambulatory Visit: Payer: Self-pay | Admitting: Physical Medicine and Rehabilitation

## 2023-09-11 DIAGNOSIS — Z7901 Long term (current) use of anticoagulants: Secondary | ICD-10-CM | POA: Diagnosis not present

## 2023-09-11 DIAGNOSIS — G8929 Other chronic pain: Secondary | ICD-10-CM | POA: Diagnosis not present

## 2023-09-11 DIAGNOSIS — Z7984 Long term (current) use of oral hypoglycemic drugs: Secondary | ICD-10-CM | POA: Diagnosis not present

## 2023-09-11 DIAGNOSIS — Z7951 Long term (current) use of inhaled steroids: Secondary | ICD-10-CM | POA: Diagnosis not present

## 2023-09-11 DIAGNOSIS — Z952 Presence of prosthetic heart valve: Secondary | ICD-10-CM | POA: Diagnosis not present

## 2023-09-11 DIAGNOSIS — K922 Gastrointestinal hemorrhage, unspecified: Secondary | ICD-10-CM | POA: Diagnosis not present

## 2023-09-11 DIAGNOSIS — I480 Paroxysmal atrial fibrillation: Secondary | ICD-10-CM | POA: Diagnosis not present

## 2023-09-11 DIAGNOSIS — E785 Hyperlipidemia, unspecified: Secondary | ICD-10-CM | POA: Diagnosis not present

## 2023-09-11 DIAGNOSIS — J4489 Other specified chronic obstructive pulmonary disease: Secondary | ICD-10-CM | POA: Diagnosis not present

## 2023-09-11 DIAGNOSIS — I130 Hypertensive heart and chronic kidney disease with heart failure and stage 1 through stage 4 chronic kidney disease, or unspecified chronic kidney disease: Secondary | ICD-10-CM | POA: Diagnosis not present

## 2023-09-11 DIAGNOSIS — Z7952 Long term (current) use of systemic steroids: Secondary | ICD-10-CM | POA: Diagnosis not present

## 2023-09-11 DIAGNOSIS — Z9981 Dependence on supplemental oxygen: Secondary | ICD-10-CM | POA: Diagnosis not present

## 2023-09-11 DIAGNOSIS — K219 Gastro-esophageal reflux disease without esophagitis: Secondary | ICD-10-CM | POA: Diagnosis not present

## 2023-09-11 DIAGNOSIS — F17210 Nicotine dependence, cigarettes, uncomplicated: Secondary | ICD-10-CM | POA: Diagnosis not present

## 2023-09-11 DIAGNOSIS — K921 Melena: Secondary | ICD-10-CM | POA: Diagnosis not present

## 2023-09-11 DIAGNOSIS — D539 Nutritional anemia, unspecified: Secondary | ICD-10-CM | POA: Diagnosis not present

## 2023-09-11 DIAGNOSIS — I5020 Unspecified systolic (congestive) heart failure: Secondary | ICD-10-CM | POA: Diagnosis not present

## 2023-09-11 DIAGNOSIS — Z79899 Other long term (current) drug therapy: Secondary | ICD-10-CM | POA: Diagnosis not present

## 2023-09-11 DIAGNOSIS — M1712 Unilateral primary osteoarthritis, left knee: Secondary | ICD-10-CM | POA: Diagnosis not present

## 2023-09-11 DIAGNOSIS — M25561 Pain in right knee: Secondary | ICD-10-CM | POA: Diagnosis not present

## 2023-09-11 DIAGNOSIS — I712 Thoracic aortic aneurysm, without rupture, unspecified: Secondary | ICD-10-CM | POA: Diagnosis not present

## 2023-09-11 DIAGNOSIS — N1831 Chronic kidney disease, stage 3a: Secondary | ICD-10-CM | POA: Diagnosis not present

## 2023-09-11 DIAGNOSIS — N179 Acute kidney failure, unspecified: Secondary | ICD-10-CM | POA: Diagnosis not present

## 2023-09-11 DIAGNOSIS — I2510 Atherosclerotic heart disease of native coronary artery without angina pectoris: Secondary | ICD-10-CM | POA: Diagnosis not present

## 2023-09-13 ENCOUNTER — Telehealth: Payer: Self-pay | Admitting: *Deleted

## 2023-09-13 ENCOUNTER — Telehealth: Payer: Self-pay | Admitting: Registered Nurse

## 2023-09-13 DIAGNOSIS — I5020 Unspecified systolic (congestive) heart failure: Secondary | ICD-10-CM | POA: Diagnosis not present

## 2023-09-13 DIAGNOSIS — F17210 Nicotine dependence, cigarettes, uncomplicated: Secondary | ICD-10-CM | POA: Diagnosis not present

## 2023-09-13 DIAGNOSIS — N179 Acute kidney failure, unspecified: Secondary | ICD-10-CM | POA: Diagnosis not present

## 2023-09-13 DIAGNOSIS — N1831 Chronic kidney disease, stage 3a: Secondary | ICD-10-CM | POA: Diagnosis not present

## 2023-09-13 DIAGNOSIS — G8929 Other chronic pain: Secondary | ICD-10-CM | POA: Diagnosis not present

## 2023-09-13 DIAGNOSIS — Z7952 Long term (current) use of systemic steroids: Secondary | ICD-10-CM | POA: Diagnosis not present

## 2023-09-13 DIAGNOSIS — D539 Nutritional anemia, unspecified: Secondary | ICD-10-CM | POA: Diagnosis not present

## 2023-09-13 DIAGNOSIS — M25561 Pain in right knee: Secondary | ICD-10-CM | POA: Diagnosis not present

## 2023-09-13 DIAGNOSIS — Z7951 Long term (current) use of inhaled steroids: Secondary | ICD-10-CM | POA: Diagnosis not present

## 2023-09-13 DIAGNOSIS — I480 Paroxysmal atrial fibrillation: Secondary | ICD-10-CM | POA: Diagnosis not present

## 2023-09-13 DIAGNOSIS — K922 Gastrointestinal hemorrhage, unspecified: Secondary | ICD-10-CM | POA: Diagnosis not present

## 2023-09-13 DIAGNOSIS — Z9981 Dependence on supplemental oxygen: Secondary | ICD-10-CM | POA: Diagnosis not present

## 2023-09-13 DIAGNOSIS — I712 Thoracic aortic aneurysm, without rupture, unspecified: Secondary | ICD-10-CM | POA: Diagnosis not present

## 2023-09-13 DIAGNOSIS — Z79899 Other long term (current) drug therapy: Secondary | ICD-10-CM | POA: Diagnosis not present

## 2023-09-13 DIAGNOSIS — I2510 Atherosclerotic heart disease of native coronary artery without angina pectoris: Secondary | ICD-10-CM | POA: Diagnosis not present

## 2023-09-13 DIAGNOSIS — J4489 Other specified chronic obstructive pulmonary disease: Secondary | ICD-10-CM | POA: Diagnosis not present

## 2023-09-13 DIAGNOSIS — Z952 Presence of prosthetic heart valve: Secondary | ICD-10-CM | POA: Diagnosis not present

## 2023-09-13 DIAGNOSIS — K219 Gastro-esophageal reflux disease without esophagitis: Secondary | ICD-10-CM | POA: Diagnosis not present

## 2023-09-13 DIAGNOSIS — E785 Hyperlipidemia, unspecified: Secondary | ICD-10-CM | POA: Diagnosis not present

## 2023-09-13 DIAGNOSIS — I130 Hypertensive heart and chronic kidney disease with heart failure and stage 1 through stage 4 chronic kidney disease, or unspecified chronic kidney disease: Secondary | ICD-10-CM | POA: Diagnosis not present

## 2023-09-13 DIAGNOSIS — Z7984 Long term (current) use of oral hypoglycemic drugs: Secondary | ICD-10-CM | POA: Diagnosis not present

## 2023-09-13 DIAGNOSIS — Z7901 Long term (current) use of anticoagulants: Secondary | ICD-10-CM | POA: Diagnosis not present

## 2023-09-13 DIAGNOSIS — K921 Melena: Secondary | ICD-10-CM | POA: Diagnosis not present

## 2023-09-13 DIAGNOSIS — M1712 Unilateral primary osteoarthritis, left knee: Secondary | ICD-10-CM | POA: Diagnosis not present

## 2023-09-13 NOTE — Telephone Encounter (Signed)
 RTC to Marylene Land with [facility] requsring Verbal Orders for Skilled Nursing for patient.  Needs orders for visits 1 time a week for 3 weeks then every other week for 6 weeks . Need to work on Medications for patient , assistance with using Oxygen for CHF.  Verbal approval was given.  Will forward to PCP for approval or denial.

## 2023-09-13 NOTE — Telephone Encounter (Signed)
 Pt left vm to get a refill of oxycodone   [facility] [address]

## 2023-09-13 NOTE — Telephone Encounter (Signed)
 Copied from CRM [id number]. Topic: Clinical - Prescription Issue >> Sep 13, 2023  8:48 AM Hector Shade B wrote: Reason for CRM: Spoke with nurse, Ms. Aniceto Boss, she stated that she needed  an update on the Gabapentin which was called in for patient, she stated the patients pain was an 8/10 and stated that if the pain didn't settle over the weekend she was going to the hospital. I advised the nurse that medications were requested for a refill on 09/10/23, but it does take anywhere from 24-48 hours or up to 3 business days. For the refill

## 2023-09-13 NOTE — Telephone Encounter (Signed)
 [facility] Verbal orders for Home PT.  1 time a week for 1 week. 2 times a week for 5 weeks and 1 time a week for 3 weeks.  Recent discharge from hospital.  Will work on Print production planner, Development worker, international aid and Activity.

## 2023-09-13 NOTE — Telephone Encounter (Signed)
 Will forward message to patient's PCP for FYI.

## 2023-09-13 NOTE — Telephone Encounter (Signed)
 Copied from CRM [id number]. Topic: Clinical - Home Health Verbal Orders >> Sep 13, 2023  1:18 PM Hamdi H wrote: Caller/Agency: Kreg Shropshire from [place] [facility]  Callback Number: [phone] Service Requested: Physical Therapy Frequency: Once a week for 1 week, then twice a week for 5 weeks, then once a week for 3 weeks  Any new concerns about the patient? No

## 2023-09-13 NOTE — Telephone Encounter (Signed)
 Copied from CRM [id number]. Topic: Clinical - Home Health Verbal Orders >> Sep 13, 2023  8:45 AM Hector Shade B wrote: Caller/Agency: [facility] Aniceto Boss) [phone] Callback Number: [phone] Service Requested: Skilled Nursing (extended) Frequency: 1x a week for 3wks & every other week for 6 wks  Any new concerns about the patient? Yes: Patient is needing the care for instructional set up for oxygen and CHF, she will be new to using the oxygen and is needing assistance.

## 2023-09-14 ENCOUNTER — Ambulatory Visit (INDEPENDENT_AMBULATORY_CARE_PROVIDER_SITE_OTHER): Admitting: Student

## 2023-09-14 ENCOUNTER — Other Ambulatory Visit: Payer: Self-pay

## 2023-09-14 ENCOUNTER — Encounter: Payer: Self-pay | Admitting: Student

## 2023-09-14 VITALS — BP 111/73 | HR 65 | Temp 98.1°F | Ht 65.0 in | Wt 168.6 lb

## 2023-09-14 DIAGNOSIS — R791 Abnormal coagulation profile: Secondary | ICD-10-CM

## 2023-09-14 DIAGNOSIS — J441 Chronic obstructive pulmonary disease with (acute) exacerbation: Secondary | ICD-10-CM

## 2023-09-14 DIAGNOSIS — M1009 Idiopathic gout, multiple sites: Secondary | ICD-10-CM | POA: Diagnosis not present

## 2023-09-14 DIAGNOSIS — I5042 Chronic combined systolic (congestive) and diastolic (congestive) heart failure: Secondary | ICD-10-CM

## 2023-09-14 DIAGNOSIS — I5020 Unspecified systolic (congestive) heart failure: Secondary | ICD-10-CM

## 2023-09-14 DIAGNOSIS — J449 Chronic obstructive pulmonary disease, unspecified: Secondary | ICD-10-CM

## 2023-09-14 DIAGNOSIS — I5040 Unspecified combined systolic (congestive) and diastolic (congestive) heart failure: Secondary | ICD-10-CM

## 2023-09-14 DIAGNOSIS — K921 Melena: Secondary | ICD-10-CM | POA: Diagnosis not present

## 2023-09-14 DIAGNOSIS — J9601 Acute respiratory failure with hypoxia: Secondary | ICD-10-CM | POA: Diagnosis not present

## 2023-09-14 DIAGNOSIS — I509 Heart failure, unspecified: Secondary | ICD-10-CM

## 2023-09-14 DIAGNOSIS — K922 Gastrointestinal hemorrhage, unspecified: Secondary | ICD-10-CM | POA: Diagnosis not present

## 2023-09-14 LAB — CBC
HCT: 34.5 % — ABNORMAL LOW (ref 36.0–46.0)
Hemoglobin: 10.3 g/dL — ABNORMAL LOW (ref 12.0–15.0)
MCH: 28.5 pg (ref 26.0–34.0)
MCHC: 29.9 g/dL — ABNORMAL LOW (ref 30.0–36.0)
MCV: 95.6 fL (ref 80.0–100.0)
Platelets: 413 10*3/uL — ABNORMAL HIGH (ref 150–400)
RBC: 3.61 MIL/uL — ABNORMAL LOW (ref 3.87–5.11)
RDW: 22.9 % — ABNORMAL HIGH (ref 11.5–15.5)
WBC: 13 10*3/uL — ABNORMAL HIGH (ref 4.0–10.5)
nRBC: 0.2 % (ref 0.0–0.2)

## 2023-09-14 LAB — PROTIME-INR
INR: 5.6 (ref 0.8–1.2)
Prothrombin Time: 50.9 s — ABNORMAL HIGH (ref 11.4–15.2)

## 2023-09-14 LAB — BASIC METABOLIC PANEL
Anion gap: 10 (ref 5–15)
BUN: 72 mg/dL — ABNORMAL HIGH (ref 8–23)
CO2: 25 mmol/L (ref 22–32)
Calcium: 8.8 mg/dL — ABNORMAL LOW (ref 8.9–10.3)
Chloride: 101 mmol/L (ref 98–111)
Creatinine, Ser: 2.23 mg/dL — ABNORMAL HIGH (ref 0.44–1.00)
GFR, Estimated: 23 mL/min — ABNORMAL LOW (ref >60)
Glucose, Bld: 78 mg/dL (ref 70–99)
Potassium: 3.7 mmol/L (ref 3.5–5.1)
Sodium: 136 mmol/L (ref 135–145)

## 2023-09-14 MED ORDER — ALBUTEROL SULFATE HFA 108 (90 BASE) MCG/ACT IN AERS: 1.0000 | INHALATION_SPRAY | RESPIRATORY_TRACT | 0 refills | Status: DC | PRN

## 2023-09-14 MED ORDER — TORSEMIDE 40 MG PO TABS: 80.0000 mg | ORAL_TABLET | Freq: Every day | ORAL | 3 refills | Status: DC

## 2023-09-14 MED ORDER — GABAPENTIN 800 MG PO TABS: 800.0000 mg | ORAL_TABLET | Freq: Three times a day (TID) | ORAL | 0 refills | Status: DC

## 2023-09-14 MED ORDER — TRELEGY ELLIPTA 100-62.5-25 MCG/ACT IN AEPB
1.0000 | INHALATION_SPRAY | Freq: Every day | RESPIRATORY_TRACT | 3 refills | Status: DC
Start: 2023-09-14 — End: 2023-10-31

## 2023-09-14 NOTE — Assessment & Plan Note (Signed)
 Current regimen is farxiga 10 daily, toprol 25 BID, losartan 12.5 daily,  torsemide 60 daily (reduced after hospital stay from 100 daily). Spironolactone 12.5 is also to be started shortly. Today she feels well and lungs are clear. However she has increased edema from discharge and her with is up by 6kg in 1 week (some but not all of the mass is likely her clothing). - Volume is re-accumulating. Increase torsemide to 80 daily. - Continue farxiga 10 daily, toprol 25 BID, losartan 12.5 daily - Anticipate starting spiro 12.5 daily, pending today's BMP - BMP today - She will collect daily am weights - RTC 1 week

## 2023-09-14 NOTE — Progress Notes (Signed)
   CC: HFU for heart failure with shcok and GI bleed  HPI:  [name] is a 73 y.o. female with a PMH stated below who presents today for HFU. She reports that she is well. She is still weak from her hospitalization. She is wearing the 2L of oxygen that she left the hospital on. She has home PT and nurse set up. She notes some increase in her LE edema. She has no dyspnea.  Please see problem based assessment and plan for additional details.  Past Medical History:  Diagnosis Date   Acute delirium 08/30/2023   Acute GI bleeding 08/18/2023   Arthritis    Asthma    Breast cancer (HCC) 2001   Left Breast Cancer   CHF (congestive heart failure) (HCC)    COPD (chronic obstructive pulmonary disease) (HCC)    Coronary artery disease    GERD (gastroesophageal reflux disease)    History of mitral valve replacement with mechanical valve    HLD (hyperlipidemia)    Hypertension    Pulmonary embolism (HCC) 2021    Review of Systems: ROS negative except for what is noted on the assessment and plan.  Vitals:   09/14/23 1101  BP: 111/73  Pulse: 65  Temp: 98.1 F (36.7 C)  TempSrc: Oral  SpO2: 100%  Weight: 168 lb 9.6 oz (76.5 kg)  Height: 5\' 5"  (1.651 m)    Physical Exam: Constitutional: chronically ill-appearing woman in no acute distress in wheelchair and wearing 2L oxygen [place] HENT: normocephalic atraumatic, mucous membranes moist. No JVD. Eyes: conjunctiva non-erythematous Cardiovascular: regular rate and rhythm, no m/r/g Pulmonary/Chest: normal work of breathing on 2L oxygen, lungs clear to auscultation bilaterally Abdominal: soft, non-tender, non-distended MSK: normal bulk and tone. 2+ LE edema bilaterally Neurological: alert & oriented x 3, no focal deficit Skin: warm and dry Psych: normal mood and behavior  Assessment & Plan:   Patient seen with Dr. Lafonda Mosses  Combined systolic and diastolic congestive heart failure (HCC) Current regimen is farxiga 10 daily, toprol  25 BID, losartan 12.5 daily,  torsemide 60 daily (reduced after hospital stay from 100 daily). Spironolactone 12.5 is also to be started shortly. Today she feels well and lungs are clear. However she has increased edema from discharge and her with is up by 6kg in 1 week (some but not all of the mass is likely her clothing). - Volume is re-accumulating. Increase torsemide to 80 daily. - Continue farxiga 10 daily, toprol 25 BID, losartan 12.5 daily - Anticipate starting spiro 12.5 daily, pending today's BMP - BMP today - She will collect daily am weights - RTC 1 week  Acute hypoxic respiratory failure (HCC) Presently on 2L oxygen, her discharge level. Not hypoxic today. Will continue to monitor.  Gastrointestinal hemorrhage with melena HFU for this issue. Etiology supratherapeutic INR + NSAIDs. No more melena since. She knows to avoid NSAIDs, ibuprofen, goody powder, advil. No dizziness/lightheadedness but not on her feet much since discharge. - CBC and INR today  Acute idiopathic gout of multiple sites Gout flare in hospital initially treated but recurred after steroids and so discharged on extended taper. Today is day 6 of a 20 day taper (40 predisone daily x 5 days, then 30 x 5, then 20 x 5, then 10 x 5). No current pain. Continue taper.  RTC 1 week for volume exam, med titration and rec.  Katheran James, D.O. [facility] Internal Medicine, PGY-1 Phone: [phone] Date 09/14/2023 Time 11:52 AM

## 2023-09-14 NOTE — Telephone Encounter (Signed)
 Daughter was contacted and provided instructions for warfarin dosing. I have reviewed her recent hospitalization note and read the note today by Dr. Katheran Kristi Norment with whom I have discussed the case. Will OMIT/HOLD today's dose.IF the patient has already TAKEN today's dose, daughter has been instructed to OMIT TOMORROW'S dose. Recommence warfarin the following day in the following manner:  Commence with one (1) of your 5 mg peach-colored warfarin tablets; the following day--take only one-half (1/2) tablet. ALTERNATE ONE (1) 5 MG TABLET WITH ONE-HALF (1/2) TABLET (2.5 MG DOSE) EVERY OTHER DAY. Return to clinic on Monday 31-MAR-25 at 0845 to see me, and will see the physician (Dr. Ninfa Meeker) at 0900h. This information has been conveyed to the daughter, [name].

## 2023-09-14 NOTE — Patient Instructions (Addendum)
 Increase torsemide to 80 mg daily.  Please weigh yourself every morning before getting dressed for the day. Please write these down and bring to next visit.  I will call once we have lab results.  Return in 1 week.

## 2023-09-14 NOTE — Assessment & Plan Note (Signed)
 Gout flare in hospital initially treated but recurred after steroids and so discharged on extended taper. Today is day 6 of a 20 day taper (40 predisone daily x 5 days, then 30 x 5, then 20 x 5, then 10 x 5). No current pain. Continue taper.

## 2023-09-14 NOTE — Assessment & Plan Note (Signed)
 HFU for this issue. Etiology supratherapeutic INR + NSAIDs. No more melena since. She knows to avoid NSAIDs, ibuprofen, goody powder, advil. No dizziness/lightheadedness but not on her feet much since discharge. - CBC and INR today

## 2023-09-14 NOTE — Assessment & Plan Note (Signed)
 Presently on 2L oxygen, her discharge level. Not hypoxic today. Will continue to monitor.

## 2023-09-15 ENCOUNTER — Encounter (HOSPITAL_BASED_OUTPATIENT_CLINIC_OR_DEPARTMENT_OTHER): Admitting: Registered Nurse

## 2023-09-15 ENCOUNTER — Encounter: Payer: Self-pay | Admitting: Registered Nurse

## 2023-09-15 VITALS — BP 136/83 | HR 70 | Temp 98.5°F | Ht 65.0 in | Wt 170.0 lb

## 2023-09-15 DIAGNOSIS — M5416 Radiculopathy, lumbar region: Secondary | ICD-10-CM | POA: Diagnosis not present

## 2023-09-15 DIAGNOSIS — G894 Chronic pain syndrome: Secondary | ICD-10-CM | POA: Diagnosis not present

## 2023-09-15 DIAGNOSIS — Z5181 Encounter for therapeutic drug level monitoring: Secondary | ICD-10-CM | POA: Diagnosis not present

## 2023-09-15 DIAGNOSIS — M25571 Pain in right ankle and joints of right foot: Secondary | ICD-10-CM

## 2023-09-15 DIAGNOSIS — Z79891 Long term (current) use of opiate analgesic: Secondary | ICD-10-CM | POA: Diagnosis not present

## 2023-09-15 MED ORDER — HYDROCODONE-ACETAMINOPHEN 5-325 MG PO TABS: 1.0000 | ORAL_TABLET | ORAL | 0 refills | Status: DC | PRN

## 2023-09-15 MED ORDER — GABAPENTIN 800 MG PO TABS: 800.0000 mg | ORAL_TABLET | Freq: Three times a day (TID) | ORAL | 3 refills | Status: DC

## 2023-09-15 MED ORDER — GABAPENTIN 800 MG PO TABS: 800.0000 mg | ORAL_TABLET | Freq: Three times a day (TID) | ORAL | 5 refills | Status: DC

## 2023-09-15 NOTE — Progress Notes (Signed)
 Subjective:    Patient ID: Kim Anthony, female    DOB: 1951/05/13, 73 y.o.   [name]: [id number]  HPI: Kim Anthony is a 73 y.o. female who returns for follow up appointment for chronic pain and medication refill. She states her pain is located in her lower back radiating into her right lower extremity and right ankle pain. She reports her ankle pain began 4- 5 days ago, she denies falling. Her current exercise regime is walking with cane short distances.    Kim Anthony last office visit was 03/01/2023, Dr Berline Chough note was reviewed.   Kim Anthony was admitted to [facility] 07/29/2023 and discharged on 08/01/2023. She was admitted with Combined Systolic and Diastolic congestive heart  failure.  She was admitted to [facility] 08/17/2023 and discharged 09/07/2023. She was admitted with Biventicular Heart Failure with reduced left ventricular function.   The above was discussed with Dr Berline Chough, Oral swab was Performed today. Her last Hydrocodone was filled on 08/07/2023, she will receive 14 day prescription, per Dr Berline Chough recommendation. Awaiting oral Swab results. The above was discussed with Kim Anthony she verbalizes understanding.    Kim Anthony Morphine equivalent is 30.00 MME.   Oral Swab was Perormed today.   Pain Inventory Average Pain 8 Pain Right Now 9 My pain is dull and aching  In the last 24 hours, has pain interfered with the following? General activity 7 Relation with others 5 Enjoyment of life 8 What TIME of day is your pain at its worst? morning  Sleep (in general) Poor  Pain is worse with: walking and standing Pain improves with: rest and medication Relief from Meds: 7  Family History  Problem Relation Age of Onset   Hypertension Mother    Cancer Father    Hypertension Father    Breast cancer Maternal Aunt 50   Heart attack Other    Social History   Socioeconomic History   Marital status: Single    Spouse name: Not on file   Number of children: Not on file   Years of  education: Not on file   Highest education level: Not on file  Occupational History   Not on file  Tobacco Use   Smoking status: Every Day    Current packs/day: 0.10    Average packs/day: 0.1 packs/day for 30.0 years (3.0 ttl pk-yrs)    Types: Cigarettes   Smokeless tobacco: Never   Tobacco comments:    5 cigs per day  Vaping Use   Vaping status: Never Used  Substance and Sexual Activity   Alcohol use: Yes    Alcohol/week: 3.0 standard drinks of alcohol    Types: 3 Standard drinks or equivalent per week    Comment: beer- ocasional   Drug use: No   Sexual activity: Not Currently    Birth control/protection: None  Other Topics Concern   Not on file  Social History Narrative   ** Merged History Encounter **       Social Drivers of Health   Financial Resource Strain: Low Risk  (12/01/2022)   Overall Financial Resource Strain (CARDIA)    Difficulty of Paying Living Expenses: Not very hard  Food Insecurity: No Food Insecurity (09/08/2023)   Hunger Vital Sign    Worried About Running Out of Food in the Last Year: Never true    Ran Out of Food in the Last Year: Never true  Transportation Needs: No Transportation Needs (09/08/2023)   PRAPARE - Transportation  Lack of Transportation (Medical): No    Lack of Transportation (Non-Medical): No  Physical Activity: Inactive (12/01/2022)   Exercise Vital Sign    Days of Exercise per Week: 0 days    Minutes of Exercise per Session: 10 min  Stress: Stress Concern Present (12/01/2022)   [employer] of Occupational Health - Occupational Stress Questionnaire    Feeling of Stress : To some extent  Social Connections: Patient Declined (08/18/2023)   Social Connection and Isolation Panel [NHANES]    Frequency of Communication with Friends and Family: Patient declined    Frequency of Social Gatherings with Friends and Family: Patient declined    Attends Religious Services: Patient declined    Database administrator or Organizations:  Patient declined    Attends Banker Meetings: Patient declined    Marital Status: Patient declined   Past Surgical History:  Procedure Laterality Date   APPLICATION OF A-CELL OF CHEST/ABDOMEN Left 01/27/2019   Procedure: APPLICATION OF A-CELL OF CHEST;  Surgeon: Peggye Form, DO;  [facility]: [facility] OR;  Service: Plastics;  Laterality: Left;   APPLICATION OF WOUND VAC N/A 01/27/2019   Procedure: APPLICATION OF WOUND VAC;  Surgeon: Peggye Form, DO;  Location: [facility] OR;  Service: Plastics;  Laterality: N/A;   BREAST SURGERY Left 2001   for CA   CARDIOVERSION N/A 02/26/2022   Procedure: CARDIOVERSION;  Surgeon: Little Ishikawa, MD;  Location: [facility] ENDOSCOPY;  Service: Cardiovascular;  Laterality: N/A;   COLONOSCOPY WITH PROPOFOL N/A 06/07/2018   Procedure: COLONOSCOPY WITH PROPOFOL;  Surgeon: Charlott Rakes, MD;  Location: [facility] ENDOSCOPY;  Service: Endoscopy;  Laterality: N/A;   DEBRIDEMENT AND CLOSURE WOUND Left 12/28/2018   Procedure: Debridement And Closure Wound;  Surgeon: Abigail Miyamoto, MD;  Location: [facility] OR;  Service: General;  Laterality: Left;   ESOPHAGOGASTRODUODENOSCOPY (EGD) WITH PROPOFOL N/A 08/24/2023   Procedure: ESOPHAGOGASTRODUODENOSCOPY (EGD) WITH PROPOFOL;  Surgeon: Lynann Bologna, DO;  Location: [facility] ENDOSCOPY;  Service: Gastroenterology;  Laterality: N/A;   INCISION AND DRAINAGE OF WOUND Left 01/27/2019   Procedure: IRRIGATION AND DEBRIDEMENT OF CHEST WALL;  Surgeon: Peggye Form, DO;  Location: [facility] OR;  Service: Plastics;  Laterality: Left;   INTRAMEDULLARY (IM) NAIL INTERTROCHANTERIC N/A 09/23/2022   Procedure: INTRAMEDULLARY (IM) NAIL INTERTROCHANTERIC;  Surgeon: Tarry Kos, MD;  [facility]: [facility] OR;  Service: Orthopedics;  Laterality: N/A;   KNEE ARTHROSCOPY WITH MEDIAL MENISECTOMY Left 08/06/2021   Procedure: KNEE ARTHROSCOPY WITH PARTIAL MEDIAL MENISECTOMY, DEBRIDEMENT;  Surgeon: Jene Every, MD;  Location: [facility] ORS;  Service:  Orthopedics;  Laterality: Left;  1 HR   LATISSIMUS FLAP TO BREAST Left 01/18/2019   Procedure: LEFT LATISSIMUS FLAP TO BREAST;  Surgeon: Peggye Form, DO;  [facility] OR;  Service: Plastics;  Laterality: Left;   MASTECTOMY Left    MITRAL VALVE REPLACEMENT     mechanical   POLYPECTOMY  06/07/2018   Procedure: POLYPECTOMY;  Surgeon: Charlott Rakes, MD;  Location: [facility] ENDOSCOPY;  Service: Endoscopy;;   RIGHT HEART CATH N/A 08/20/2023   Procedure: RIGHT HEART CATH;  Surgeon: Dolores Patty, MD;  [facility] INVASIVE CV LAB;  Service: Cardiovascular;  Laterality: N/A;   RIGHT/LEFT HEART CATH AND CORONARY ANGIOGRAPHY N/A 01/09/2021   Procedure: RIGHT/LEFT HEART CATH AND CORONARY ANGIOGRAPHY;  Surgeon: Dolores Patty, MD;  [facility] INVAS[facility] CV LAB;  Service: Cardiovascular;  Laterality: N/A;   WOUND DEBRIDEMENT Left 12/28/2018   Procedure: EXCISION OF LEFT CHRONIC CHEST WALL WOUND;  Surgeon:  Abigail Miyamoto, MD;  Location: [facility] OR;  Service: General;  Laterality: Left;   Past Surgical History:  Procedure Laterality Date   APPLICATION OF A-CELL OF CHEST/ABDOMEN Left 01/27/2019   Procedure: APPLICATION OF A-CELL OF CHEST;  Surgeon: Peggye Form, DO;  [facility] OR;  Service: Plastics;  Laterality: Left;   APPLICATION OF WOUND VAC N/A 01/27/2019   Procedure: APPLICATION OF WOUND VAC;  Surgeon: Peggye Form, DO;  [facility] OR;  Service: Plastics;  Laterality: N/A;   BREAST SURGERY Left 2001   for CA   CARDIOVERSION N/A 02/26/2022   Procedure: CARDIOVERSION;  Surgeon: Little Ishikawa, MD;  Location: [facility] ENDOSCOPY;  Service: Cardiovascular;  Laterality: N/A;   COLONOSCOPY WITH PROPOFOL N/A 06/07/2018   Procedure: COLONOSCOPY WITH PROPOFOL;  Surgeon: Charlott Rakes, MD;  Location: [facility] ENDOSCOPY;  Service: Endoscopy;  Laterality: N/A;   DEBRIDEMENT AND CLOSURE WOUND Left 12/28/2018   Procedure: Debridement And Closure Wound;  Surgeon: Abigail Miyamoto, MD;  Location: [facility] OR;  Service: General;  Laterality: Left;   ESOPHAGOGASTRODUODENOSCOPY (EGD) WITH PROPOFOL N/A 08/24/2023   Procedure: ESOPHAGOGASTRODUODENOSCOPY (EGD) WITH PROPOFOL;  Surgeon: Lynann Bologna, DO;  Location: [facility] ENDOSCOPY;  Service: Gastroenterology;  Laterality: N/A;   INCISION AND DRAINAGE OF WOUND Left 01/27/2019   Procedure: IRRIGATION AND DEBRIDEMENT OF CHEST WALL;  Surgeon: Peggye Form, DO;  [facility];  Service: Plastics;  Laterality: Left;   INTRAMEDULLARY (IM) NAIL INTERTROCHANTERIC N/A 09/23/2022   Procedure: INTRAMEDULLARY (IM) NAIL INTERTROCHANTERIC;  Surgeon: Tarry Kos, MD;  [facility] OR;  Service: Orthopedics;  Laterality: N/A;   KNEE ARTHROSCOPY WITH MEDIAL MENISECTOMY Left 08/06/2021   Procedure: KNEE ARTHROSCOPY WITH PARTIAL MEDIAL MENISECTOMY, DEBRIDEMENT;  Surgeon: Jene Every, MD;  Location: [facility] ORS;  Service: Orthopedics;  Laterality: Left;  1 HR   LATISSIMUS FLAP TO BREAST Left 01/18/2019   Procedure: LEFT LATISSIMUS FLAP TO BREAST;  Surgeon: Peggye Form, DO;  [facility] OR;  Service: Plastics;  Laterality: Left;   MASTECTOMY Left    MITRAL VALVE REPLACEMENT     mechanical   POLYPECTOMY  06/07/2018   Procedure: POLYPECTOMY;  Surgeon: Charlott Rakes, MD;  Location: [facility] ENDOSCOPY;  Service: Endoscopy;;   RIGHT HEART CATH N/A 08/20/2023   Procedure: RIGHT HEART CATH;  Surgeon: Dolores Patty, MD;  [facility] CV LAB;  Service: Cardiovascular;  Laterality: N/A;   RIGHT/LEFT HEART CATH AND CORONARY ANGIOGRAPHY N/A 01/09/2021   Procedure: RIGHT/LEFT HEART CATH AND CORONARY ANGIOGRAPHY;  Surgeon: Dolores Patty, MD;  [facility] CV LAB;  Service: Cardiovascular;  Laterality: N/A;   WOUND DEBRIDEMENT Left 12/28/2018   Procedure: EXCISION OF LEFT CHRONIC CHEST WALL WOUND;  Surgeon: Abigail Miyamoto, MD;  Location: [facility] OR;  Service: General;  Laterality: Left;   Past Medical History:   Diagnosis Date   Acute delirium 08/30/2023   Acute GI bleeding 08/18/2023   Arthritis    Asthma    Breast cancer (HCC) 2001   Left Breast Cancer   CHF (congestive heart failure) (HCC)    COPD (chronic obstructive pulmonary disease) (HCC)    Coronary artery disease    GERD (gastroesophageal reflux disease)    History of mitral valve replacement with mechanical valve    HLD (hyperlipidemia)    Hypertension    Pulmonary embolism (HCC) 2021   There were no vitals taken for this visit.  Opioid Risk Score:   Fall Risk Score:  `1  Depression screen PHQ  2/9     12/01/2022   11:27 AM 12/01/2022   10:54 AM 07/02/2022   11:30 AM 06/29/2022   10:31 AM 03/16/2022   10:57 AM 01/13/2022   11:32 AM 01/09/2022    9:02 AM  Depression screen PHQ 2/9  Decreased Interest 0 0 1 0 0 0 0  Down, Depressed, Hopeless 0 0 0 0 0 0 0  PHQ - 2 Score 0 0 1 0 0 0 0  Altered sleeping   0      Tired, decreased energy   0      Change in appetite   0      Feeling bad or failure about yourself    0      Trouble concentrating   0      Moving slowly or fidgety/restless   0      Suicidal thoughts   0      PHQ-9 Score   1      Difficult doing work/chores   Not difficult at all        Review of Systems  Musculoskeletal:  Positive for back pain.       R hip pain, swelling of R ankle  Neurological:  Positive for weakness.       Objective:   Physical Exam Vitals and nursing note reviewed.  Constitutional:      Appearance: Normal appearance.  Cardiovascular:     Rate and Rhythm: Normal rate and regular rhythm.     Pulses: Normal pulses.     Heart sounds: Normal heart sounds.  Pulmonary:     Effort: Pulmonary effort is normal.     Breath sounds: Normal breath sounds.     Comments: Continuous Oxygen @ 2 liters nasal cannula Musculoskeletal:     Comments: Normal Muscle Bulk and Muscle Testing Reveals:  Upper Extremities: Full ROM and Muscle Strength 5/5  Lumbar Paraspinal Tenderness: L-3-L-5 Right  Greater Trochanter Tenderness Lower Extremities: Right: Decreased ROM and Muscle Strength 5/5 Right Lower Extremity Flexion Produces Pain into her Right Ankle Let Lower Extremity: Full ROM and Muscle Strength 5/5 Arises rom Table slowly using cane for support Antalgic  Gait     Skin:    General: Skin is warm and dry.  Neurological:     Mental Status: She is alert and oriented to person, place, and time.  Psychiatric:        Mood and Affect: Mood normal.        Behavior: Behavior normal.         Assessment & Plan:  Right Lumbar Radiculitis: Continue Gabapentin. Kim Anthony reports she will begin physical therapy next week, two days a week. Continue to monitor.  Acute Right Ankle Pain: She denies falling. She will F/U with her Orthopedist. Continue to Monitor.  Chronic Pain Syndrome: Refilled: Hydrocodone 5-325 mg one tablet every 4 hours as needed for pain #84, two week supply, awaiting Oral Swab results. Per Dr Berline Chough Recommendation. Continue to Monitor.  F/U in 2 months

## 2023-09-16 ENCOUNTER — Ambulatory Visit: Admitting: Registered Nurse

## 2023-09-16 ENCOUNTER — Other Ambulatory Visit

## 2023-09-16 ENCOUNTER — Telehealth: Payer: Self-pay

## 2023-09-16 MED ORDER — HYDROCODONE-ACETAMINOPHEN 5-325 MG PO TABS: 1.0000 | ORAL_TABLET | ORAL | 0 refills | Status: DC | PRN

## 2023-09-16 NOTE — Patient Instructions (Signed)
 Visit Information  Thank you for taking time to visit with me today. Please don't hesitate to contact me if I can be of assistance to you before our next scheduled telephone appointment.  Our next appointment is by telephone on 09/23/23 at 3pm  Following is a copy of your care plan:   Goals Addressed             This Visit's Progress    TOC Care Plan       Current Barriers:  Knowledge Deficits related to plan of care for management of CHF - 09/16/23 Educated provided on going TOC Follow up calls needed   RNCM Clinical Goal(s):  Patient will work with the Care Management team over the next 30 days to address Transition of Care Barriers: Medication Management take all medications exactly as prescribed and will call provider for medication related questions as evidenced by Electronic Medical Record attend all scheduled medical appointments: PCP and Specialist as evidenced by Electronic Medical Records through collaboration with RN Care manager, provider, and care team.   Interventions: Evaluation of current treatment plan related to  self management and patient's adherence to plan as established by provider   Heart Failure Interventions:  (Status:  Goal on Track.) Short Term Goal Assessed need for readable accurate scales in home Provided education about placing scale on hard, flat surface Advised patient to weigh each morning after emptying bladder Discussed importance of daily weight and advised patient to weigh and record daily Reviewed role of diuretics in prevention of fluid overload and management of heart failure; Discussed the importance of keeping all appointments with provider  Patient Goals/Self-Care Activities: Participate in Transition of Care Program/Attend [facility] scheduled calls Notify RN Care Manager of TOC call rescheduling needs Take all medications as prescribed Attend all scheduled provider appointments Call pharmacy for medication refills 3-7 days in advance of  running out of medications Perform all self care activities independently  Call provider office for new concerns or questions  call office if I gain more than 2 pounds in one day or 5 pounds in one week track weight in diary watch for swelling in feet, ankles and legs every day weigh myself daily track symptoms and what helps feel better or worse  Follow Up Plan:  Telephone follow up appointment with care management team member scheduled for:  09/23/2023 3:00pm Hilbert Odor The patient has been provided with contact information for the care management team and has been advised to call with any health related questions or concerns.  Next PCP appointment scheduled for:  Rudene Christians [phone]        Patient verbalizes understanding of instructions and care plan provided today and agrees to view in MyChart. Active MyChart status and patient understanding of how to access instructions and care plan via MyChart confirmed with patient.     Telephone follow up appointment with care management team member scheduled for: 09/23/23 3pm The patient has been provided with contact information for the care management team and has been advised to call with any health related questions or concerns.   Please call the care guide team at [phone] if you need to cancel or reschedule your appointment.   Please call the Suicide and Crisis Lifeline: 988 call the [place] National Suicide Prevention Lifeline: 804-874-0344 or TTY: 8035011060 TTY 845-345-4586) to talk to a trained counselor call 1-800-273-TALK (toll free, 24 hour hotline) call 911 if you are experiencing a Mental Health or Behavioral Health Crisis or need someone to talk  to.  Hilbert Odor RN, CCM [place]  VBCI-Population Health RN Care Manager [phone]

## 2023-09-16 NOTE — Progress Notes (Signed)
 Internal Medicine Clinic Attending  I was physically present during the key portions of the resident provided service and participated in the medical decision making of patient's management care. I reviewed pertinent patient test results.  The assessment, diagnosis, and plan were formulated together and I agree with the documentation in the resident's note.  Kim Eon, MD   I know [name] well from her recent hospitalization.  Her weight is up today - she is wearing a jacket, shoes, etc, but I suspect her weight truly is increased from hospital discharge. Her lower extremities are warm but she has bilateral pitting edema that is increased since hospital discharge.   Cr increased to 2 - I think this is cardiorenal in setting of volume overload from under-diuresis  Will increase torsemide to 80mg  daily.  Patient has a scale at home and will start checking daily standing weights every morning and will bring this log to clinic on Monday at her quick follow up appointment.  INR elevated at 5 - I agree with Dr Saralyn Pilar plan to hold warfarin dose and then decrease further doses. No bleeding.  This sick patient is at high risk of readmission, but I'm hoping with warfarin adjustment and increased oral diuretics we can keep her out of the hospital

## 2023-09-16 NOTE — Patient Outreach (Signed)
 Care Management  Transitions of Care Program Transitions of Care Post-discharge week 2   09/16/2023 Name: Kim Anthony [name]: [id number] DOB: 31-Aug-1950  Subjective: Kim Anthony is a 73 y.o. year old female who is a primary care patient of Kathleen Lime, MD. The Care Management team Engaged with patient Engaged with patient by telephone to assess and address transitions of care needs.   Consent to Services:  Patient was given information about care management services, agreed to services, and gave verbal consent to participate.   Assessment:     Patient reports she is doing well and had her provider appointments 3/25 and 09/15/23. Patient confirms next appointment with Dr Ninfa Meeker is 09/20/23 and next coumadin clinic appointment is 09/20/23. Reviewed CHF and importance of daily weights and calling MD with weight gain, shortness of breath, or increased swelling - patient agrees to ongoing TOC follow up. Call scheduled.       SDOH Interventions    Flowsheet Row Telephone from 09/08/2023 in [place] POPULATION HEALTH DEPARTMENT Telephone from 08/02/2023 in [place] POPULATION HEALTH DEPARTMENT Telephone from 09/03/2022 in [facility] Community Care Coordination ED to Hosp-Admission (Discharged) from 08/27/2022 in [facility] GENERAL MED/SURG UNIT Care Coordination from 07/03/2022 in [facility] Community Care Coordination Care Coordination from 03/27/2022 in [facility] Community Care Coordination  SDOH Interventions        Food Insecurity Interventions Intervention Not Indicated Intervention Not Indicated Intervention Not Indicated -- -- Intervention Not Indicated  Housing Interventions Intervention Not Indicated Intervention Not Indicated Intervention Not Indicated -- -- --  Transportation Interventions Patient Resources (Friends/Family), Intervention Not Indicated Intervention Not Indicated -- -- -- Intervention Not Indicated  Utilities Interventions  Intervention Not Indicated Intervention Not Indicated -- Inpatient TOC  [Resources provided in the AVS for social services and financial assistance through social services including utility assiance.] Other (Comment)  [Care guides] --        Goals Addressed             This Visit's Progress    TOC Care Plan       Current Barriers:  Knowledge Deficits related to plan of care for management of CHF - 09/16/23 Educated provided on going TOC Follow up calls needed   RNCM Clinical Goal(s):  Patient will work with the Care Management team over the next 30 days to address Transition of Care Barriers: Medication Management take all medications exactly as prescribed and will call provider for medication related questions as evidenced by Electronic Medical Record attend all scheduled medical appointments: PCP and Specialist as evidenced by Electronic Medical Records through collaboration with RN Care manager, provider, and care team.   Interventions: Evaluation of current treatment plan related to  self management and patient's adherence to plan as established by provider   Heart Failure Interventions:  (Status:  Goal on Track.) Short Term Goal Assessed need for readable accurate scales in home Provided education about placing scale on hard, flat surface Advised patient to weigh each morning after emptying bladder Discussed importance of daily weight and advised patient to weigh and record daily Reviewed role of diuretics in prevention of fluid overload and management of heart failure; Discussed the importance of keeping all appointments with provider  Patient Goals/Self-Care Activities: Participate in Transition of Care Program/Attend [facility] scheduled calls Notify RN Care Manager of [facility] call rescheduling needs Take all medications as prescribed Attend all scheduled provider appointments Call pharmacy for medication refills 3-7 days in advance of  running out of medications Perform all self  care activities independently  Call provider office for new concerns or questions  call office if I gain more than 2 pounds in one day or 5 pounds in one week track weight in diary watch for swelling in feet, ankles and legs every day weigh myself daily track symptoms and what helps feel better or worse  Follow Up Plan:  Telephone follow up appointment with care management team member scheduled for:  09/23/2023 3:00pm Hilbert Odor The patient has been provided with contact information for the care management team and has been advised to call with any health related questions or concerns.  Next PCP appointment scheduled for:  Rudene Christians [phone]        Plan: Telephone follow up appointment with care management team member scheduled for: 09/23/23 3pm The patient has been provided with contact information for the care management team and has been advised to call with any health related questions or concerns.   Hilbert Odor RN, CCM [place]  VBCI-Population Health RN Care Manager [phone]

## 2023-09-16 NOTE — Addendum Note (Signed)
 Addended by: Jones Bales on: 09/16/2023 10:09 AM   Modules accepted: Orders

## 2023-09-17 ENCOUNTER — Other Ambulatory Visit: Payer: Self-pay | Admitting: Physical Medicine and Rehabilitation

## 2023-09-17 ENCOUNTER — Inpatient Hospital Stay: Admitting: Nurse Practitioner

## 2023-09-17 MED ORDER — HYDROCODONE-ACETAMINOPHEN 5-325 MG PO TABS: 1.0000 | ORAL_TABLET | ORAL | 0 refills | Status: DC | PRN

## 2023-09-18 LAB — DRUG TOX MONITOR 1 W/CONF, ORAL FLD
Amphetamines: NEGATIVE ng/mL (ref <10)
Barbiturates: NEGATIVE ng/mL (ref <10)
Benzodiazepines: NEGATIVE ng/mL (ref <0.50)
Buprenorphine: NEGATIVE ng/mL (ref <0.10)
Cocaine: NEGATIVE ng/mL (ref <5.0)
Codeine: NEGATIVE ng/mL (ref <2.5)
Dihydrocodeine: 3.2 ng/mL — ABNORMAL HIGH (ref <2.5)
Fentanyl: NEGATIVE ng/mL (ref <0.10)
Heroin Metabolite: NEGATIVE ng/mL (ref <1.0)
Hydrocodone: 42.4 ng/mL — ABNORMAL HIGH (ref <2.5)
Hydromorphone: NEGATIVE ng/mL (ref <2.5)
MARIJUANA: NEGATIVE ng/mL (ref <2.5)
MDMA: NEGATIVE ng/mL (ref <10)
Meprobamate: NEGATIVE ng/mL (ref <2.5)
Methadone: NEGATIVE ng/mL (ref <5.0)
Morphine: NEGATIVE ng/mL (ref <2.5)
Nicotine Metabolite: NEGATIVE ng/mL (ref <5.0)
Norhydrocodone: NEGATIVE ng/mL (ref <2.5)
Noroxycodone: NEGATIVE ng/mL (ref <2.5)
Opiates: POSITIVE ng/mL — AB (ref <2.5)
Oxycodone: NEGATIVE ng/mL (ref <2.5)
Oxymorphone: NEGATIVE ng/mL (ref <2.5)
Phencyclidine: NEGATIVE ng/mL (ref <10)
Tapentadol: NEGATIVE ng/mL (ref <5.0)
Tramadol: NEGATIVE ng/mL (ref <5.0)
Zolpidem: NEGATIVE ng/mL (ref <5.0)

## 2023-09-18 LAB — DRUG TOX ALC METAB W/CON, ORAL FLD: Alcohol Metabolite: NEGATIVE ng/mL (ref <25)

## 2023-09-19 ENCOUNTER — Inpatient Hospital Stay (HOSPITAL_COMMUNITY)
Admission: EM | Admit: 2023-09-19 | Discharge: 2023-09-22 | DRG: 280 | Disposition: A | Discharge status: Home Health | Attending: Infectious Diseases | Admitting: Infectious Diseases

## 2023-09-19 ENCOUNTER — Encounter (HOSPITAL_COMMUNITY): Payer: Self-pay | Admitting: Emergency Medicine

## 2023-09-19 ENCOUNTER — Emergency Department (HOSPITAL_COMMUNITY)

## 2023-09-19 ENCOUNTER — Other Ambulatory Visit: Payer: Self-pay

## 2023-09-19 DIAGNOSIS — F17210 Nicotine dependence, cigarettes, uncomplicated: Secondary | ICD-10-CM | POA: Diagnosis present

## 2023-09-19 DIAGNOSIS — I5082 Biventricular heart failure: Secondary | ICD-10-CM | POA: Diagnosis present

## 2023-09-19 DIAGNOSIS — B0229 Other postherpetic nervous system involvement: Secondary | ICD-10-CM | POA: Diagnosis present

## 2023-09-19 DIAGNOSIS — I130 Hypertensive heart and chronic kidney disease with heart failure and stage 1 through stage 4 chronic kidney disease, or unspecified chronic kidney disease: Secondary | ICD-10-CM | POA: Diagnosis present

## 2023-09-19 DIAGNOSIS — G8929 Other chronic pain: Secondary | ICD-10-CM | POA: Diagnosis present

## 2023-09-19 DIAGNOSIS — Z923 Personal history of irradiation: Secondary | ICD-10-CM

## 2023-09-19 DIAGNOSIS — Z952 Presence of prosthetic heart valve: Secondary | ICD-10-CM | POA: Diagnosis not present

## 2023-09-19 DIAGNOSIS — N179 Acute kidney failure, unspecified: Secondary | ICD-10-CM | POA: Diagnosis present

## 2023-09-19 DIAGNOSIS — I5041 Acute combined systolic (congestive) and diastolic (congestive) heart failure: Secondary | ICD-10-CM | POA: Diagnosis present

## 2023-09-19 DIAGNOSIS — I21A1 Myocardial infarction type 2: Secondary | ICD-10-CM | POA: Diagnosis present

## 2023-09-19 DIAGNOSIS — J9621 Acute and chronic respiratory failure with hypoxia: Principal | ICD-10-CM

## 2023-09-19 DIAGNOSIS — R791 Abnormal coagulation profile: Secondary | ICD-10-CM

## 2023-09-19 DIAGNOSIS — N1832 Chronic kidney disease, stage 3b: Secondary | ICD-10-CM | POA: Diagnosis present

## 2023-09-19 DIAGNOSIS — Z803 Family history of malignant neoplasm of breast: Secondary | ICD-10-CM | POA: Diagnosis not present

## 2023-09-19 DIAGNOSIS — I4510 Unspecified right bundle-branch block: Secondary | ICD-10-CM | POA: Diagnosis present

## 2023-09-19 DIAGNOSIS — F419 Anxiety disorder, unspecified: Secondary | ICD-10-CM | POA: Diagnosis present

## 2023-09-19 DIAGNOSIS — Z7901 Long term (current) use of anticoagulants: Secondary | ICD-10-CM

## 2023-09-19 DIAGNOSIS — Z9012 Acquired absence of left breast and nipple: Secondary | ICD-10-CM | POA: Diagnosis not present

## 2023-09-19 DIAGNOSIS — Z885 Allergy status to narcotic agent status: Secondary | ICD-10-CM

## 2023-09-19 DIAGNOSIS — E785 Hyperlipidemia, unspecified: Secondary | ICD-10-CM | POA: Diagnosis present

## 2023-09-19 DIAGNOSIS — Z9221 Personal history of antineoplastic chemotherapy: Secondary | ICD-10-CM

## 2023-09-19 DIAGNOSIS — I509 Heart failure, unspecified: Secondary | ICD-10-CM

## 2023-09-19 DIAGNOSIS — Z8249 Family history of ischemic heart disease and other diseases of the circulatory system: Secondary | ICD-10-CM

## 2023-09-19 DIAGNOSIS — Z79899 Other long term (current) drug therapy: Secondary | ICD-10-CM | POA: Diagnosis not present

## 2023-09-19 DIAGNOSIS — M109 Gout, unspecified: Secondary | ICD-10-CM | POA: Diagnosis present

## 2023-09-19 DIAGNOSIS — Z853 Personal history of malignant neoplasm of breast: Secondary | ICD-10-CM

## 2023-09-19 DIAGNOSIS — Z86711 Personal history of pulmonary embolism: Secondary | ICD-10-CM

## 2023-09-19 DIAGNOSIS — I5031 Acute diastolic (congestive) heart failure: Secondary | ICD-10-CM | POA: Diagnosis not present

## 2023-09-19 DIAGNOSIS — Z886 Allergy status to analgesic agent status: Secondary | ICD-10-CM

## 2023-09-19 DIAGNOSIS — R0602 Shortness of breath: Secondary | ICD-10-CM | POA: Diagnosis not present

## 2023-09-19 DIAGNOSIS — Z7951 Long term (current) use of inhaled steroids: Secondary | ICD-10-CM

## 2023-09-19 DIAGNOSIS — I480 Paroxysmal atrial fibrillation: Secondary | ICD-10-CM | POA: Diagnosis present

## 2023-09-19 DIAGNOSIS — J449 Chronic obstructive pulmonary disease, unspecified: Secondary | ICD-10-CM | POA: Diagnosis present

## 2023-09-19 DIAGNOSIS — Z91048 Other nonmedicinal substance allergy status: Secondary | ICD-10-CM

## 2023-09-19 DIAGNOSIS — I517 Cardiomegaly: Secondary | ICD-10-CM | POA: Diagnosis not present

## 2023-09-19 DIAGNOSIS — D649 Anemia, unspecified: Secondary | ICD-10-CM | POA: Diagnosis present

## 2023-09-19 DIAGNOSIS — I2510 Atherosclerotic heart disease of native coronary artery without angina pectoris: Secondary | ICD-10-CM | POA: Diagnosis present

## 2023-09-19 DIAGNOSIS — Z888 Allergy status to other drugs, medicaments and biological substances status: Secondary | ICD-10-CM

## 2023-09-19 DIAGNOSIS — Z9981 Dependence on supplemental oxygen: Secondary | ICD-10-CM

## 2023-09-19 DIAGNOSIS — R918 Other nonspecific abnormal finding of lung field: Secondary | ICD-10-CM | POA: Diagnosis not present

## 2023-09-19 DIAGNOSIS — K219 Gastro-esophageal reflux disease without esophagitis: Secondary | ICD-10-CM | POA: Diagnosis present

## 2023-09-19 LAB — BASIC METABOLIC PANEL WITH GFR
Anion gap: 15 (ref 5–15)
BUN: 70 mg/dL — ABNORMAL HIGH (ref 8–23)
CO2: 28 mmol/L (ref 22–32)
Calcium: 8.5 mg/dL — ABNORMAL LOW (ref 8.9–10.3)
Chloride: 92 mmol/L — ABNORMAL LOW (ref 98–111)
Creatinine, Ser: 2.67 mg/dL — ABNORMAL HIGH (ref 0.44–1.00)
GFR, Estimated: 18 mL/min — ABNORMAL LOW (ref >60)
Glucose, Bld: 120 mg/dL — ABNORMAL HIGH (ref 70–99)
Potassium: 4.8 mmol/L (ref 3.5–5.1)
Sodium: 135 mmol/L (ref 135–145)

## 2023-09-19 LAB — CBC
HCT: 32.9 % — ABNORMAL LOW (ref 36.0–46.0)
Hemoglobin: 9.6 g/dL — ABNORMAL LOW (ref 12.0–15.0)
MCH: 28.7 pg (ref 26.0–34.0)
MCHC: 29.2 g/dL — ABNORMAL LOW (ref 30.0–36.0)
MCV: 98.5 fL (ref 80.0–100.0)
Platelets: 322 10*3/uL (ref 150–400)
RBC: 3.34 MIL/uL — ABNORMAL LOW (ref 3.87–5.11)
RDW: 22.8 % — ABNORMAL HIGH (ref 11.5–15.5)
WBC: 9.1 10*3/uL (ref 4.0–10.5)
nRBC: 0.4 % — ABNORMAL HIGH (ref 0.0–0.2)

## 2023-09-19 LAB — BRAIN NATRIURETIC PEPTIDE: B Natriuretic Peptide: 900.4 pg/mL — ABNORMAL HIGH (ref 0.0–100.0)

## 2023-09-19 LAB — TROPONIN I (HIGH SENSITIVITY): Troponin I (High Sensitivity): 13 ng/L (ref <18)

## 2023-09-19 MED ORDER — FUROSEMIDE 10 MG/ML IJ SOLN
80.0000 mg | Freq: Once | INTRAMUSCULAR | Status: AC
  Administered 2023-09-20: 80 mg via INTRAVENOUS
  Filled 2023-09-19: qty 8

## 2023-09-19 MED ORDER — SENNOSIDES-DOCUSATE SODIUM 8.6-50 MG PO TABS: 1.0000 | ORAL_TABLET | Freq: Every evening | ORAL | Status: DC | PRN

## 2023-09-19 MED ORDER — ACETAMINOPHEN 325 MG PO TABS
650.0000 mg | ORAL_TABLET | Freq: Four times a day (QID) | ORAL | Status: DC | PRN
  Administered 2023-09-20 – 2023-09-21 (×2): 650 mg via ORAL
  Filled 2023-09-19 (×2): qty 2

## 2023-09-19 MED ORDER — ACETAMINOPHEN 650 MG RE SUPP: 650.0000 mg | Freq: Four times a day (QID) | RECTAL | Status: DC | PRN

## 2023-09-19 NOTE — ED Notes (Signed)
Pt given gingerale and sandwich

## 2023-09-19 NOTE — Hospital Course (Addendum)
 She went to a recital with her daughter to day and [employer] and she noted worsening shortness of breath. Her son also noticed more swelling  She has had non-productive cough. Symptoms started about 2 days ago. Last night she had to prop herself up more to sleep comfortably.  No fevers or chills.  She has not been peeing a lot. Denies dysuria. She did start taking higher doe of diuretic. She does feel like she is wheezing she has been using them the last 2 days.  No blood in stool since last hospital admission.  She's been taking torsemide 80 mg since office visit 3/25.   Home medications:

## 2023-09-19 NOTE — ED Triage Notes (Signed)
 Pt here from home with c/o sob , pt recently just released from hospital and was sent home on 2 liters 02 otc , pt up to 3 liters today, hx of chf

## 2023-09-19 NOTE — ED Provider Notes (Signed)
 [place] EMERGENCY DEPARTMENT AT [facility] Provider Note   CSN: [id number] Arrival date & time: 09/19/23  1737     History  Chief Complaint  Patient presents with   Shortness of Breath    Kim Anthony is a 73 y.o. female.  66 yoF with a chief complaint of difficulty breathing.  Patient was recently hospitalized.  She feels like her legs are more swollen than they have been and she has had more difficulty breathing.  She is normally on 2 L of oxygen since this admission and had to turn up to 3.  She denies cough or congestion.  Denies chest pain or pressure.   Shortness of Breath      Home Medications Prior to Admission medications   Medication Sig Start Date End Date Taking? Authorizing Provider  albuterol (VENTOLIN HFA) 108 (90 Base) MCG/ACT inhaler Inhale 1 puff into the lungs every 4 (four) hours as needed for wheezing or shortness of breath. 09/14/23   Katheran James, DO  FARXIGA 10 MG TABS tablet Take 1 tablet by mouth once daily 08/31/23   Kathleen Lime, MD  fluticasone (FLONASE) 50 MCG/ACT nasal spray Place 2 sprays into both nostrils daily. Patient taking differently: Place 1 spray into both nostrils daily. 05/17/23 05/16/24  Kathleen Lime, MD  Fluticasone-Umeclidin-Vilant (TRELEGY ELLIPTA) 100-62.5-25 MCG/ACT AEPB Inhale 1 puff into the lungs daily. 09/14/23   Katheran James, DO  gabapentin (NEURONTIN) 800 MG tablet Take 1 tablet (800 mg total) by mouth 3 (three) times daily. 09/15/23   Kathleen Lime, MD  gabapentin (NEURONTIN) 800 MG tablet Take 1 tablet (800 mg total) by mouth 3 (three) times daily. 09/15/23   Kathleen Lime, MD  HYDROcodone-acetaminophen (NORCO/VICODIN) 5-325 MG tablet Take 1 tablet by mouth every 4 (four) hours as needed for moderate pain (pain score 4-6). Diagnosis- chronic low back pain 09/17/23   Lovorn, Aundra Millet, MD  losartan (COZAAR) 25 MG tablet Take 0.5 tablets (12.5 mg total) by mouth daily. 08/10/23 11/08/23  Jacklynn Ganong, FNP  metoprolol succinate (TOPROL-XL) 25 MG 24 hr tablet Take 1 tablet (25 mg total) by mouth 2 (two) times daily. 08/01/23   Monna Fam, MD  potassium chloride (KLOR-CON M) 10 MEQ tablet Take 2 tablets (20 mEq total) by mouth daily. 09/07/23 04/04/24  Kathleen Lime, MD  predniSONE (DELTASONE) 20 MG tablet Take 2 tablets (40 mg total) by mouth daily with breakfast for 5 days, THEN 1.5 tablets (30 mg total) daily with breakfast for 5 days, THEN 1 tablet (20 mg total) daily with breakfast for 5 days, THEN 0.5 tablets (10 mg total) daily with breakfast for 5 days. 09/07/23 09/27/23  Kathleen Lime, MD  simvastatin (ZOCOR) 20 MG tablet Take 1 tablet (20 mg total) by mouth daily. 12/03/22   Willette Cluster, MD  spironolactone (ALDACTONE) 25 MG tablet Take 0.5 tablets (12.5 mg total) by mouth daily. Dont start this medication until you are seen in the clinic on March 25th 09/08/23   Kathleen Lime, MD  tetrahydrozoline 0.05 % ophthalmic solution Place 1 drop into both eyes 3 (three) times daily.    [provider]  Torsemide 40 MG TABS Take 80 mg by mouth daily. 09/14/23 10/14/23  Katheran James, DO  warfarin (COUMADIN) 5 MG tablet Take 1 tablet (5 mg total) by mouth one time only at 4 PM. 09/07/23   Kathleen Lime, MD      Allergies    Lisinopril, Acetaminophen-codeine, Propoxyphene, Tape, and Tramadol  Review of Systems   Review of Systems  Respiratory:  Positive for shortness of breath.     Physical Exam Updated Vital Signs BP 106/75 (BP Location: Right Arm)   Pulse 60   Temp 98.2 F (36.8 C) (Oral)   Resp 20   SpO2 100%  Physical Exam Vitals and nursing note reviewed.  Constitutional:      General: She is not in acute distress.    Appearance: She is well-developed. She is not diaphoretic.  HENT:     Head: Normocephalic and atraumatic.  Eyes:     Pupils: Pupils are equal, round, and reactive to light.  Cardiovascular:     Rate and Rhythm: Normal rate and regular rhythm.      Heart sounds: No murmur heard.    No friction rub. No gallop.     Comments: JVD to mid neck Pulmonary:     Effort: Pulmonary effort is normal.     Breath sounds: No wheezing or rales.  Abdominal:     General: There is no distension.     Palpations: Abdomen is soft.     Tenderness: There is no abdominal tenderness.  Musculoskeletal:        General: No tenderness.     Cervical back: Normal range of motion and neck supple.     Right lower leg: Edema present.     Left lower leg: Edema present.     Comments: Bilateral lower extremity edema.  2+ to the shins.  Skin:    General: Skin is warm and dry.  Neurological:     Mental Status: She is alert and oriented to person, place, and time.  Psychiatric:        Behavior: Behavior normal.     ED Results / Procedures / Treatments   Labs (all labs ordered are listed, but only abnormal results are displayed) Labs Reviewed  BASIC METABOLIC PANEL WITH GFR - Abnormal; Notable for the following components:      Result Value   Chloride 92 (*)    Glucose, Bld 120 (*)    BUN 70 (*)    Creatinine, Ser 2.67 (*)    Calcium 8.5 (*)    GFR, Estimated 18 (*)    All other components within normal limits  CBC - Abnormal; Notable for the following components:   RBC 3.34 (*)    Hemoglobin 9.6 (*)    HCT 32.9 (*)    MCHC 29.2 (*)    RDW 22.8 (*)    nRBC 0.4 (*)    All other components within normal limits  BRAIN NATRIURETIC PEPTIDE - Abnormal; Notable for the following components:   B Natriuretic Peptide 900.4 (*)    All other components within normal limits  PROTIME-INR  TROPONIN I (HIGH SENSITIVITY)  TROPONIN I (HIGH SENSITIVITY)    EKG None  Radiology DG Chest 2 View Result Date: 09/19/2023 CLINICAL DATA:  Shortness of breath. EXAM: CHEST - 2 VIEW COMPARISON:  August 27, 2023. FINDINGS: Stable cardiomegaly. Status post cardiac valve repair. Right lung is clear. New left basilar opacity is noted concerning for atelectasis or infiltrate  with possible small pleural effusion. Bony thorax is unremarkable. IMPRESSION: New left basilar opacity as noted above. Electronically Signed   By: Lupita Raider M.D.   On: 09/19/2023 19:19    Procedures .Critical Care  Performed by: Melene Plan, DO Authorized by: Melene Plan, DO   Critical care provider statement:    Critical care time (minutes):  35  Critical care time was exclusive of:  Separately billable procedures and treating other patients   Critical care was time spent personally by me on the following activities:  Development of treatment plan with patient or surrogate, discussions with consultants, evaluation of patient's response to treatment, examination of patient, ordering and review of laboratory studies, ordering and review of radiographic studies, ordering and performing treatments and interventions, pulse oximetry, re-evaluation of patient's condition and review of old charts   Care discussed with: admitting provider       Medications Ordered in ED Medications  furosemide (LASIX) injection 80 mg (has no administration in time range)    ED Course/ Medical Decision Making/ A&P                                 Medical Decision Making Amount and/or Complexity of Data Reviewed Labs: ordered. Radiology: ordered.  Risk Prescription drug management.   73 yo F with a chief complaints of bilateral lower extremity edema and difficulty breathing.  The patient tells me that she was recently hospitalized and feels like she had significant swelling since.  Will obtain a laboratory evaluation chest x-ray.  Chest x-ray independently interpreted by me with left basilar opacity.  Patient with no significant edema.  BNP elevated to 500.  No significant electrolyte abnormalities.  Radiology read is opacities possible infiltrate.  I think more likely this is effusion.  Discussed with medicine for admission.  The patients results and plan were reviewed and discussed.   Any  x-rays performed were independently reviewed by myself.   Differential diagnosis were considered with the presenting HPI.  Medications  furosemide (LASIX) injection 80 mg (has no administration in time range)    Vitals:   09/19/23 1759 09/19/23 2130  BP: (!) 123/107 106/75  Pulse: 62 60  Resp: (!) 26 20  Temp: 98.5 F (36.9 C) 98.2 F (36.8 C)  TempSrc:  Oral  SpO2: 97% 100%    Final diagnoses:  Acute on chronic respiratory failure with hypoxia (HCC)    Admission/ observation were discussed with the admitting physician, patient and/or family and they are comfortable with the plan.          Final Clinical Impression(s) / ED Diagnoses Final diagnoses:  Acute on chronic respiratory failure with hypoxia [facility])    Rx / DC Orders ED Discharge Orders     None         Melene Plan, DO 09/19/23 2318

## 2023-09-19 NOTE — H&P (Incomplete)
 Date: 09/19/2023               Patient Name:  Kim Anthony [name]: [id number]  DOB: July 15, 1950 Age / Sex: 73 y.o., female   PCP: Kathleen Lime, MD         Medical Service: Internal Medicine Teaching Service         Attending Physician: Dr. Melene Plan, DO      First Contact: Dr. Annett Fabian, MD     Second Contact: Dr. Modena Slater, DO          After Hours (After 5p/  First Contact Pager: [phone]  weekends / holidays): Second Contact Pager: [phone]   SUBJECTIVE   Chief Complaint: Shortness of breath  History of Present Illness: Kim Anthony is a 73 y.o. female with PMH of pAfib and mitral valve replacement on wafarin, biventricular HF, COPD, gout, CKD4.   Presents with worsening shortness of breath and leg swelling for past 2-3 days. She went to a recital with her daughter today in [place] and she noted worsening shortness of breath. She has had a non-productive cough with some wheezing. Last night she had to prop herself up more to sleep comfortably. Had to increase her home O2 from 2L to 3L. Denies fever, chills, n/v. No recent sick contacts. Denies chest pain or abdominal pain. She has not been peeing a lot despite the increased dose of torsemide. Denies dysuria. Denies any blood in stools since last admission.   Recently hospitalized for GIB and complicated picture of hemorrhagic and cardiogenic shock with her biventricular failure requiring ICU stay on milrinone. Discharged on 3/19. Last [facility] visit was 3/25 and torsemide was increased to 80 mg daily due to weight increase. Prednisone taper for gout flare.   ED Course: Vitals were hemodynamically stable, saturating well on 3L [place], afebrile.  Labs significant for Scr 2.67, stable anemia of 9.6, BNP elevated to 900, troponin 13.  Imaging CXR showed possible left basilar opacity and small pleural effusion.  Received IV lasix 80 mg in ED.  Consulted IMTS for admission.   Meds:  -albuterol -Farxiga -Trelegy   -gabapentin -losartan -metoprolol -Norco -potassium -prednisone *** -simvastatin -spironolactone -torsemide -warfarin   No outpatient medications have been marked as taking for the 09/19/23 encounter [facility] Encounter).    Past Medical History Past Medical History:  Diagnosis Date  . Acute delirium 08/30/2023  . Acute GI bleeding 08/18/2023  . Arthritis   . Asthma   . Breast cancer (HCC) 2001   Left Breast Cancer  . CHF (congestive heart failure) (HCC)   . COPD (chronic obstructive pulmonary disease) (HCC)   . Coronary artery disease   . GERD (gastroesophageal reflux disease)   . History of mitral valve replacement with mechanical valve   . HLD (hyperlipidemia)   . Hypertension   . Pulmonary embolism (HCC) 2021    Past Surgical History Past Surgical History:  Procedure Laterality Date  . APPLICATION OF A-CELL OF CHEST/ABDOMEN Left 01/27/2019   Procedure: APPLICATION OF A-CELL OF CHEST;  Surgeon: Peggye Form, DO;  Location: [facility] OR;  Service: Plastics;  Laterality: Left;  . APPLICATION OF WOUND VAC N/A 01/27/2019   Procedure: APPLICATION OF WOUND VAC;  Surgeon: Peggye Form, DO;  Location: [facility] OR;  Service: Plastics;  Laterality: N/A;  . BREAST SURGERY Left 2001   for CA  . CARDIOVERSION N/A 02/26/2022   Procedure: CARDIOVERSION;  Surgeon: Little Ishikawa, MD;  Location: [facility] ENDOSCOPY;  Service: Cardiovascular;  Laterality: N/A;  . COLONOSCOPY WITH PROPOFOL N/A 06/07/2018   Procedure: COLONOSCOPY WITH PROPOFOL;  Surgeon: Charlott Rakes, MD;  Location: [facility] ENDOSCOPY;  Service: Endoscopy;  Laterality: N/A;  . DEBRIDEMENT AND CLOSURE WOUND Left 12/28/2018   Procedure: Debridement And Closure Wound;  Surgeon: Abigail Miyamoto, MD;  [facility] OR;  Service: General;  Laterality: Left;  . ESOPHAGOGASTRODUODENOSCOPY (EGD) WITH PROPOFOL N/A 08/24/2023   Procedure: ESOPHAGOGASTRODUODENOSCOPY (EGD) WITH PROPOFOL;  Surgeon: Lynann Bologna, DO;   Location: [facility] ENDOSCOPY;  Service: Gastroenterology;  Laterality: N/A;  . INCISION AND DRAINAGE OF WOUND Left 01/27/2019   Procedure: IRRIGATION AND DEBRIDEMENT OF CHEST WALL;  Surgeon: Peggye Form, DO;  Location: [facility] OR;  Service: Plastics;  Laterality: Left;  . INTRAMEDULLARY (IM) NAIL INTERTROCHANTERIC N/A 09/23/2022   Procedure: INTRAMEDULLARY (IM) NAIL INTERTROCHANTERIC;  Surgeon: Tarry Kos, MD;  Location: [facility] OR;  Service: Orthopedics;  Laterality: N/A;  . KNEE ARTHROSCOPY WITH MEDIAL MENISECTOMY Left 08/06/2021   Procedure: KNEE ARTHROSCOPY WITH PARTIAL MEDIAL MENISECTOMY, DEBRIDEMENT;  Surgeon: Jene Every, MD;  Location: [facility] ORS;  Service: Orthopedics;  Laterality: Left;  1 HR  . LATISSIMUS FLAP TO BREAST Left 01/18/2019   Procedure: LEFT LATISSIMUS FLAP TO BREAST;  Surgeon: Peggye Form, DO;  Location: [facility] OR;  Service: Plastics;  Laterality: Left;  [name] MASTECTOMY Left   . MITRAL VALVE REPLACEMENT     mechanical  . POLYPECTOMY  06/07/2018   Procedure: POLYPECTOMY;  Surgeon: Charlott Rakes, MD;  Location: [facility] ENDOSCOPY;  Service: Endoscopy;;  . RIGHT HEART CATH N/A 08/20/2023   Procedure: RIGHT HEART CATH;  Surgeon: Dolores Patty, MD;  Location[facility] INVASIVE CV LAB;  Service: Cardiovascular;  Laterality: N/A;  . RIGHT/LEFT HEART CATH AND CORONARY ANGIOGRAPHY N/A 01/09/2021   Procedure: RIGHT/LEFT HEART CATH AND CORONARY ANGIOGRAPHY;  Surgeon: Dolores Patty, MD;  Location: [facility] INVASIVE CV LAB;  Service: Cardiovascular;  Laterality: N/A;  . WOUND DEBRIDEMENT Left 12/28/2018   Procedure: EXCISION OF LEFT CHRONIC CHEST WALL WOUND;  Surgeon: Abigail Miyamoto, MD;  Location: [facility] OR;  Service: General;  Laterality: Left;    Social:  Lives With: *** Occupation: *** Support: *** Level of Function: *** PCP: *** Substances: -Tobacco: *** -Alcohol: *** -Recreational Drug: ***  Family History: *** Family History  Problem Relation Age of Onset  .  Hypertension Mother   . Cancer Father   . Hypertension Father   . Breast cancer Maternal Aunt 50  . Heart attack Other     Allergies: Allergies as of 09/19/2023 - Review Complete 09/19/2023  Allergen Reaction Noted  . Lisinopril Swelling 01/07/2021  . Acetaminophen-codeine Itching 07/10/2015  . Propoxyphene Itching 01/18/2019  . Tape Rash 03/09/2019  . Tramadol Itching, Nausea And Vomiting, and Rash 07/10/2015    Review of Systems: A complete ROS was negative except as per HPI.   OBJECTIVE:   Physical Exam: Blood pressure 106/75, pulse 60, temperature 98.2 F (36.8 C), temperature source Oral, resp. rate 20, SpO2 100%.  Constitutional: well-appearing *** sitting in ***, in no acute distress HENT: normocephalic atraumatic, mucous membranes moist Eyes: conjunctiva non-erythematous Neck: supple Cardiovascular: regular rate and rhythm, no m/r/g Pulmonary/Chest: normal work of breathing on room air, lungs clear to auscultation bilaterally Abdominal: soft, non-tender, non-distended MSK: normal bulk and tone Neurological: alert & oriented x 3, 5/5 strength in bilateral upper and lower extremities, normal gait Skin: warm and dry Psych: ***  Labs: CBC    Component Value Date/Time   WBC 9.1  09/19/2023 2152   RBC 3.34 (L) 09/19/2023 2152   HGB 9.6 (L) 09/19/2023 2152   HGB 12.8 01/13/2022 1038   HCT 32.9 (L) 09/19/2023 2152   HCT 38.8 01/13/2022 1038   PLT 322 09/19/2023 2152   PLT 427 01/13/2022 1038   MCV 98.5 09/19/2023 2152   MCV 96 01/13/2022 1038   MCH 28.7 09/19/2023 2152   MCHC 29.2 (L) 09/19/2023 2152   RDW 22.8 (H) 09/19/2023 2152   RDW 11.4 (L) 01/13/2022 1038   LYMPHSABS 1.9 08/23/2023 0454   LYMPHSABS 3.0 04/21/2018 1624   MONOABS 2.7 (H) 08/23/2023 0454   EOSABS 0.4 08/23/2023 0454   EOSABS 0.2 04/21/2018 1624   BASOSABS 0.1 08/23/2023 0454   BASOSABS 0.1 04/21/2018 1624     CMP     Component Value Date/Time   NA 135 09/19/2023 2152   NA 134  04/16/2023 1036   K 4.8 09/19/2023 2152   CL 92 (L) 09/19/2023 2152   CO2 28 09/19/2023 2152   GLUCOSE 120 (H) 09/19/2023 2152   BUN 70 (H) 09/19/2023 2152   BUN 20 04/16/2023 1036   CREATININE 2.67 (H) 09/19/2023 2152   CALCIUM 8.5 (L) 09/19/2023 2152   PROT 6.4 (L) 08/25/2023 0542   PROT 7.9 01/27/2021 1015   ALBUMIN 2.0 (L) 08/25/2023 0542   ALBUMIN 4.6 01/27/2021 1015   AST 23 08/25/2023 0542   ALT 11 08/25/2023 0542   ALKPHOS 108 08/25/2023 0542   BILITOT 2.7 (H) 08/25/2023 0542   BILITOT 0.5 01/27/2021 1015   GFRNONAA 18 (L) 09/19/2023 2152   GFRAA 104 02/28/2020 1159    ***  Imaging:  DG Chest 2 View CLINICAL DATA:  Shortness of breath.  EXAM: CHEST - 2 VIEW  COMPARISON:  August 27, 2023.  FINDINGS: Stable cardiomegaly. Status post cardiac valve repair. Right lung is clear. New left basilar opacity is noted concerning for atelectasis or infiltrate with possible small pleural effusion. Bony thorax is unremarkable.  IMPRESSION: New left basilar opacity as noted above.  Electronically Signed   By: Lupita Raider M.D.   On: 09/19/2023 19:19   EKG: personally reviewed my interpretation is***. Prior EKG***  ASSESSMENT & PLAN:   Assessment & Plan by Problem: Active Problems:   * No active hospital problems. *   Kim Anthony is a 73 y.o. person living with a history of *** who presented with *** and admitted for *** on hospital day 0  *** ***  *** ***  *** ***  Diet: {NAMES:3044014::"Normal","Heart Healthy","Carb-Modified","Renal","Carb/Renal","NPO","TPN","Tube Feeds"} VTE: {NAMES:3044014::"Heparin","Enoxaparin","SCDs","DOAC","None"} IVF: {NAMES:3044014::"None","NS","1/2 NS","LR","D5","D10"},{NAMES:3044014::"None","10cc/hr","25cc/hr","50cc/hr","75cc/hr","100cc/hr","110cc/hr","125cc/hr","Bolus"} Code: {NAMES:3044014::"Full","DNR","DNI","DNR/DNI","Comfort Care","Unknown"} Surrogate Decision Maker: ***  Prior to Admission Living Arrangement:  {NAMES:3044014::"Home, living ***","SNF, ***","Homeless","***"} Anticipated Discharge Location: {NAMES:3044014::"Home","SNF","CIR","***"} Barriers to Discharge: ***  Dispo: Admit patient to {STATUS:3044014::"Observation with expected length of stay less than 2 midnights.","Inpatient with expected length of stay greater than 2 midnights."}  Signed: Rana Snare, DO Internal Medicine Resident PGY-2 Pager: [phone] 09/19/2023, 10:33 PM   Please contact on-call pager, weekdays after 5pm and weekends after 1pm, at [phone].

## 2023-09-19 NOTE — H&P (Incomplete)
 Date: 09/20/2023               Patient Name:  Kim Anthony [name]: [id number]  DOB: 1950/12/30 Age / Sex: 73 y.o., female   PCP: Kathleen Lime, MD         Medical Service: Internal Medicine Teaching Service         Attending Physician: Dr. Ginnie Smart, MD      First Contact: Dr. Kathleen Lime, MD  Pager:  [phone]  Second Contact: Dr. Marrianne Mood, MD  Pager:  [phone]       After Hours (After 5p/  First Contact Pager: [phone]  weekends / holidays): Second Contact Pager: [phone]   SUBJECTIVE   Chief Complaint: Shortness of breath  History of Present Illness: Kim Anthony is a 73 y.o. female with PMH of pAfib and mechanical mitral valve replacement on wafarin, biventricular HF, COPD, gout, CKD3B and hx of L breast cancer s/p mastectomy and radiation/chemo, tobacco use, chronic pain.   Presents with worsening shortness of breath and leg swelling for past 2-3 days. She went to a recital with her daughter today in [place] and she noted worsening shortness of breath. She has had a non-productive cough with some wheezing. Last night she had to prop herself up more to sleep comfortably. Had to increase her home O2 from 2L to 3L. Denies fever, chills, n/v. No recent sick contacts. Denies chest pain or abdominal pain. She has not been peeing a lot despite the increased dose of torsemide. Denies dysuria. Denies any blood in stools since last admission.   Recently hospitalized for GIB and complicated picture of hemorrhagic and cardiogenic shock with her biventricular failure requiring ICU stay on milrinone. Discharged on 3/19. Last [facility] visit was 3/25 and torsemide was increased to 80 mg daily due to weight increase. Prednisone taper for gout flare.   ED Course: Vitals were hemodynamically stable, saturating well on 3L [place], afebrile.  Labs significant for Scr 2.67, stable anemia of 9.6, BNP elevated to 900, troponin 13.  Imaging CXR showed possible left basilar opacity and small pleural  effusion.  Received IV lasix 80 mg in ED.  Consulted IMTS for admission.   Meds:  -albuterol PRN -Farxiga 10 mg daily  -Flonase  -Trelegy daily  -gabapentin 800 mg 3 times daily -losartan 12.5 mg daily -metoprolol 25 mg twice daily -Norco 5-325 mg every 4 as needed -potassium 20 mEq -prednisone taper -simvastatin 20 mg -spironolactone 12.5 mg -torsemide 80 mg daily -warfarin 5 mg daily   No outpatient medications have been marked as taking for the 09/19/23 encounter [facility] Encounter).    Past Medical History Past Medical History:  Diagnosis Date   Acute delirium 08/30/2023   Acute GI bleeding 08/18/2023   Arthritis    Asthma    Breast cancer (HCC) 2001   Left Breast Cancer   CHF (congestive heart failure) (HCC)    COPD (chronic obstructive pulmonary disease) (HCC)    Coronary artery disease    GERD (gastroesophageal reflux disease)    History of mitral valve replacement with mechanical valve    HLD (hyperlipidemia)    Hypertension    Pulmonary embolism (HCC) 2021    Past Surgical History Past Surgical History:  Procedure Laterality Date   APPLICATION OF A-CELL OF CHEST/ABDOMEN Left 01/27/2019   Procedure: APPLICATION OF A-CELL OF CHEST;  Surgeon: Peggye Form, DO;  Location: [facility] OR;  Service: Plastics;  Laterality: Left;   APPLICATION OF WOUND VAC N/A 01/27/2019  Procedure: APPLICATION OF WOUND VAC;  Surgeon: Peggye Form, DO;  Location: [facility] OR;  Service: Plastics;  Laterality: N/A;   BREAST SURGERY Left 2001   for CA   CARDIOVERSION N/A 02/26/2022   Procedure: CARDIOVERSION;  Surgeon: Little Ishikawa, MD;  Location: [facility] ENDOSCOPY;  Service: Cardiovascular;  Laterality: N/A;   COLONOSCOPY WITH PROPOFOL N/A 06/07/2018   Procedure: COLONOSCOPY WITH PROPOFOL;  Surgeon: Charlott Rakes, MD;  Location: [facility] ENDOSCOPY;  Service: Endoscopy;  Laterality: N/A;   DEBRIDEMENT AND CLOSURE WOUND Left 12/28/2018   Procedure: Debridement And Closure  Wound;  Surgeon: Abigail Miyamoto, MD;  Location: [facility] OR;  Service: General;  Laterality: Left;   ESOPHAGOGASTRODUODENOSCOPY (EGD) WITH PROPOFOL N/A 08/24/2023   Procedure: ESOPHAGOGASTRODUODENOSCOPY (EGD) WITH PROPOFOL;  Surgeon: Lynann Bologna, DO;  Location: [facility] ENDOSCOPY;  Service: Gastroenterology;  Laterality: N/A;   INCISION AND DRAINAGE OF WOUND Left 01/27/2019   Procedure: IRRIGATION AND DEBRIDEMENT OF CHEST WALL;  Surgeon: Peggye Form, DO;  Location: [facility] OR;  Service: Plastics;  Laterality: Left;   INTRAMEDULLARY (IM) NAIL INTERTROCHANTERIC N/A 09/23/2022   Procedure: INTRAMEDULLARY (IM) NAIL INTERTROCHANTERIC;  Surgeon: Tarry Kos, MD;  Location: [facility] OR;  Service: Orthopedics;  Laterality: N/A;   KNEE ARTHROSCOPY WITH MEDIAL MENISECTOMY Left 08/06/2021   Procedure: KNEE ARTHROSCOPY WITH PARTIAL MEDIAL MENISECTOMY, DEBRIDEMENT;  Surgeon: Jene Every, MD;  Location: [facility] ORS;  Service: Orthopedics;  Laterality: Left;  1 HR   LATISSIMUS FLAP TO BREAST Left 01/18/2019   Procedure: LEFT LATISSIMUS FLAP TO BREAST;  Surgeon: Peggye Form, DO;  Location: [facility] OR;  Service: Plastics;  Laterality: Left;   MASTECTOMY Left    MITRAL VALVE REPLACEMENT     mechanical   POLYPECTOMY  06/07/2018   Procedure: POLYPECTOMY;  Surgeon: Charlott Rakes, MD;  Location: [facility] ENDOSCOPY;  Service: Endoscopy;;   RIGHT HEART CATH N/A 08/20/2023   Procedure: RIGHT HEART CATH;  Surgeon: Dolores Patty, MD;  Location: [facility] INVASIVE CV LAB;  Service: Cardiovascular;  Laterality: N/A;   RIGHT/LEFT HEART CATH AND CORONARY ANGIOGRAPHY N/A 01/09/2021   Procedure: RIGHT/LEFT HEART CATH AND CORONARY ANGIOGRAPHY;  Surgeon: Dolores Patty, MD;  Location: [facility] INVASIVE CV LAB;  Service: Cardiovascular;  Laterality: N/A;   WOUND DEBRIDEMENT Left 12/28/2018   Procedure: EXCISION OF LEFT CHRONIC CHEST WALL WOUND;  Surgeon: Abigail Miyamoto, MD;  Location: [facility] OR;  Service: General;  Laterality: Left;     Social:  Lives With: daughter Support: family Level of Function: independent with ADLs and uses cane and walker at home PCP: Kathleen Lime, MD Substances: -Tobacco: 1/4 ppd for past 30 years -Alcohol: denies -Recreational Drug: denies  Family History:  Family History  Problem Relation Age of Onset   Hypertension Mother    Cancer Father    Hypertension Father    Breast cancer Maternal Aunt 50   Heart attack Other     Allergies: Allergies as of 09/19/2023 - Review Complete 09/19/2023  Allergen Reaction Noted   Lisinopril Swelling 01/07/2021   Acetaminophen-codeine Itching 07/10/2015   Propoxyphene Itching 01/18/2019   Tape Rash 03/09/2019   Tramadol Itching, Nausea And Vomiting, and Rash 07/10/2015    Review of Systems: A complete ROS was negative except as per HPI.   OBJECTIVE:   Physical Exam: Blood pressure 106/75, pulse 60, temperature 98.2 F (36.8 C), temperature source Oral, resp. rate 20, SpO2 100%.  Constitutional: alert, sitting up in bed, in no acute distress HENT: normocephalic atraumatic Neck: supple, JVD noted  to mandible  Cardiovascular: RRR, mechanical click auscultated, 2+ DP pulses bilaterally  Pulmonary/Chest: normal work of breathing on 3L [place], basilar crackles, no wheezing Abdominal: bowel sounds present, soft, non-tender, non-distended MSK: bilateral 1/2+ pitting LE edema with warm lower extremities  Neurological: alert & oriented x 3 Skin: warm LE bilaterally   Labs: CBC    Component Value Date/Time   WBC 9.1 09/19/2023 2152   RBC 3.34 (L) 09/19/2023 2152   HGB 9.6 (L) 09/19/2023 2152   HGB 12.8 01/13/2022 1038   HCT 32.9 (L) 09/19/2023 2152   HCT 38.8 01/13/2022 1038   PLT 322 09/19/2023 2152   PLT 427 01/13/2022 1038   MCV 98.5 09/19/2023 2152   MCV 96 01/13/2022 1038   MCH 28.7 09/19/2023 2152   MCHC 29.2 (L) 09/19/2023 2152   RDW 22.8 (H) 09/19/2023 2152   RDW 11.4 (L) 01/13/2022 1038   LYMPHSABS 1.9 08/23/2023 0454    LYMPHSABS 3.0 04/21/2018 1624   MONOABS 2.7 (H) 08/23/2023 0454   EOSABS 0.4 08/23/2023 0454   EOSABS 0.2 04/21/2018 1624   BASOSABS 0.1 08/23/2023 0454   BASOSABS 0.1 04/21/2018 1624     CMP     Component Value Date/Time   NA 135 09/19/2023 2152   NA 134 04/16/2023 1036   K 4.8 09/19/2023 2152   CL 92 (L) 09/19/2023 2152   CO2 28 09/19/2023 2152   GLUCOSE 120 (H) 09/19/2023 2152   BUN 70 (H) 09/19/2023 2152   BUN 20 04/16/2023 1036   CREATININE 2.67 (H) 09/19/2023 2152   CALCIUM 8.5 (L) 09/19/2023 2152   PROT 6.4 (L) 08/25/2023 0542   PROT 7.9 01/27/2021 1015   ALBUMIN 2.0 (L) 08/25/2023 0542   ALBUMIN 4.6 01/27/2021 1015   AST 23 08/25/2023 0542   ALT 11 08/25/2023 0542   ALKPHOS 108 08/25/2023 0542   BILITOT 2.7 (H) 08/25/2023 0542   BILITOT 0.5 01/27/2021 1015   GFRNONAA 18 (L) 09/19/2023 2152   GFRAA 104 02/28/2020 1159    Imaging:  DG Chest 2 View CLINICAL DATA:  Shortness of breath.  EXAM: CHEST - 2 VIEW  COMPARISON:  August 27, 2023.  FINDINGS: Stable cardiomegaly. Status post cardiac valve repair. Right lung is clear. New left basilar opacity is noted concerning for atelectasis or infiltrate with possible small pleural effusion. Bony thorax is unremarkable.  IMPRESSION: New left basilar opacity as noted above.  Electronically Signed   By: Lupita Raider M.D.   On: 09/19/2023 19:19   EKG: personally reviewed my interpretation issinus rhythm, RBBB previously seen on prior EKG.   ASSESSMENT & PLAN:   Assessment & Plan by Problem: Principal Problem:   Acute combined systolic and diastolic heart failure (HCC)   Kim Anthony is a 73 y.o. person living with a history of pAfib and mitral valve replacement on wafarin, biventricular HF, COPD, gout, HTN, HLD, CKD 3B, hx of L breast cancer s/p mastectomy and radiation/chemo, tobacco use, chronic pain who presented with SOB and leg swelling and admitted for acute on chronic biventricular HF on hospital  day 1  Acute on chronic biventricular heart failure (EF 55%) Presented with shortness of breath and LE edema. PTA on torsemide 80 mg since [facility] visit on 3/25. BNP elevated to 900.  Troponin 13. Chest x-ray with low lung volumes.  Clinically appears volume overloaded with increase O2 demand from home 2L. Lower extremities appear perfused well at this time with 2+ DP and warm skin. Start IV diuresis  and monitor closely given recently admitted for cardiogenic shock 2/2 biventricular HF. Recent echo on 2/26 with EF 55% with RV dysfunction. Heart cath on 2/28 showed mild/mod PAH with cor pulmonale and normal left pressure.  -S/p IV Lasix 80 mg in ED -Reassess in AM for diuresis, likely will need to increase dose or frequency  -Repeat echo  -Trend troponin  -Hold home losartan, Farxiga, metoprolol and spironolactone for now, restart as appropriate -Strict I&Os, daily weights, fluid restriction  -Trend BMP, replete electrolytes as needed, maintain goal K >4 and Mag >2  AKI on CKD 3B Creatinine on admission increased to 2.67 with GFR 18 from baseline of 1.4-1.9. Despite outpatient increased torsemide, patient reports unchanged UOP. No other urinary symptoms. Worry for progression of her renal disease and suspect component of cardiorenal at this time.  -Trend renal function -Strict I&Os, monitor UOP -Avoid nephrotoxic agents and renally dose medications -Continue diuresis, if further worsening renal status consider consult to nephrology   Elevated troponin Trops 13>>265. Patient denies any chest pain or pressure. EKG on arrival with NSR, previously seen RBBB and abnormal lateral T but appears to be on prior EKG as well.  -Monitor for any new cardiac symptoms -Trend troponin to peak, if troponin exponentially rising, repeat EKG and consult cardiology   pAfib on [facility] Mechanical mitral valve  NSR on EKG. PTA on metoprolol and warfarin. Recently check INR elevated to 5 with plans to hold and decrease dose  on 3/25. No signs of acute bleed.  -Warfarin per pharmacy, daily PT-INR -Hold metoprolol in setting of acute exacerbation, restart when appropriate  COPD on home 2L [place]  No wheezing on exam. Non-productive cough. Increased to 3L on arrival. Appears more volume overload than COPD exacerbation.  -Continue hospital formulary for home Trelegy inhaler -Maintain SpO2 of 88-92%, wean back to home 2L [place] as tolerated   Chronic anemia, normocytic Hx of GI bleed Hgb stable at 9.6 which is close to baseline of 10-11. No overt signs of bleeding, denies symptoms of GIB. Iron studies done earlier this month with ferritin 27, iron 26 with saturation 6%.  -Trend CBC -Consider giving IV iron in hospital setting or outpatient infusion  -Continue home Protonix 40 mg daily   Hx of gout  Recently treated for acute gout flare of multiple joints of UE.  Reports finishing prednisone taper.  Symptoms have improved.  -Hold prednisone given gout flare improved and currently acute CHF   Chronic low back pain  Hx of postherpetic neuralgia  PTA on gabapentin 800 mg TID. Follows with PMR outpatient for home Norco 5-325 Q4H PRN.  -renally dose gabapentin given renal function  -tylenol PRN and oxycodone 5 mg Q4H PRN   HTN: holding home antihypertensives given normotensive but also AKI on admission, restart as appropriate  HLD: continue home simvastatin  Tobacco use disorder: provided nicotine patch   Diet:  2g Na with fluid restriction  VTE:  warfarin IVF: None, Code: Full Surrogate Decision Maker: daughter [name])  Prior to Admission Living Arrangement: Home, living with daughter Anticipated Discharge Location: Home Barriers to Discharge: medical stability, diuresis   Dispo: Admit patient to Inpatient with expected length of stay greater than 2 midnights.  Signed: Rana Snare, DO Internal Medicine Resident PGY-2 Pager: [phone] 09/20/2023, 12:06 AM   Please contact on-call pager, weekdays after 5pm  and weekends after 1pm, at [phone].

## 2023-09-20 ENCOUNTER — Encounter: Admitting: Student

## 2023-09-20 ENCOUNTER — Other Ambulatory Visit: Payer: Self-pay

## 2023-09-20 ENCOUNTER — Inpatient Hospital Stay (HOSPITAL_COMMUNITY)

## 2023-09-20 ENCOUNTER — Ambulatory Visit

## 2023-09-20 DIAGNOSIS — R791 Abnormal coagulation profile: Secondary | ICD-10-CM | POA: Diagnosis not present

## 2023-09-20 DIAGNOSIS — I5041 Acute combined systolic (congestive) and diastolic (congestive) heart failure: Secondary | ICD-10-CM | POA: Diagnosis not present

## 2023-09-20 DIAGNOSIS — Z952 Presence of prosthetic heart valve: Secondary | ICD-10-CM

## 2023-09-20 DIAGNOSIS — N179 Acute kidney failure, unspecified: Secondary | ICD-10-CM | POA: Diagnosis not present

## 2023-09-20 DIAGNOSIS — N1832 Chronic kidney disease, stage 3b: Secondary | ICD-10-CM

## 2023-09-20 DIAGNOSIS — I5031 Acute diastolic (congestive) heart failure: Secondary | ICD-10-CM | POA: Diagnosis not present

## 2023-09-20 LAB — ECHOCARDIOGRAM LIMITED
Area-P 1/2: 2.17 cm2
Height: 65 in
S' Lateral: 3.1 cm
Weight: 2560 [oz_av]

## 2023-09-20 LAB — RESP PANEL BY RT-PCR (RSV, FLU A&B, COVID)  RVPGX2
Influenza A by PCR: NEGATIVE
Influenza B by PCR: NEGATIVE
Resp Syncytial Virus by PCR: NEGATIVE
SARS Coronavirus 2 by RT PCR: NEGATIVE

## 2023-09-20 LAB — BASIC METABOLIC PANEL WITH GFR
Anion gap: 16 — ABNORMAL HIGH (ref 5–15)
BUN: 69 mg/dL — ABNORMAL HIGH (ref 8–23)
CO2: 28 mmol/L (ref 22–32)
Calcium: 8.1 mg/dL — ABNORMAL LOW (ref 8.9–10.3)
Chloride: 91 mmol/L — ABNORMAL LOW (ref 98–111)
Creatinine, Ser: 2.7 mg/dL — ABNORMAL HIGH (ref 0.44–1.00)
GFR, Estimated: 18 mL/min — ABNORMAL LOW (ref >60)
Glucose, Bld: 153 mg/dL — ABNORMAL HIGH (ref 70–99)
Potassium: 4.3 mmol/L (ref 3.5–5.1)
Sodium: 135 mmol/L (ref 135–145)

## 2023-09-20 LAB — TROPONIN I (HIGH SENSITIVITY)
Troponin I (High Sensitivity): 265 ng/L (ref <18)
Troponin I (High Sensitivity): 14 ng/L (ref <18)
Troponin I (High Sensitivity): 12 ng/L (ref <18)

## 2023-09-20 LAB — CBC
HCT: 32 % — ABNORMAL LOW (ref 36.0–46.0)
Hemoglobin: 9.3 g/dL — ABNORMAL LOW (ref 12.0–15.0)
MCH: 28.4 pg (ref 26.0–34.0)
MCHC: 29.1 g/dL — ABNORMAL LOW (ref 30.0–36.0)
MCV: 97.9 fL (ref 80.0–100.0)
Platelets: 337 10*3/uL (ref 150–400)
RBC: 3.27 MIL/uL — ABNORMAL LOW (ref 3.87–5.11)
RDW: 22.5 % — ABNORMAL HIGH (ref 11.5–15.5)
WBC: 7.1 10*3/uL (ref 4.0–10.5)
nRBC: 0.6 % — ABNORMAL HIGH (ref 0.0–0.2)

## 2023-09-20 LAB — PROTIME-INR
INR: 7.2 (ref 0.8–1.2)
Prothrombin Time: 61.7 s — ABNORMAL HIGH (ref 11.4–15.2)

## 2023-09-20 LAB — MAGNESIUM: Magnesium: 2.2 mg/dL (ref 1.7–2.4)

## 2023-09-20 MED ORDER — SIMVASTATIN 20 MG PO TABS
20.0000 mg | ORAL_TABLET | Freq: Every day | ORAL | Status: DC
  Administered 2023-09-20 – 2023-09-21 (×2): 20 mg via ORAL
  Filled 2023-09-20 (×2): qty 1

## 2023-09-20 MED ORDER — PANTOPRAZOLE SODIUM 40 MG PO TBEC
40.0000 mg | DELAYED_RELEASE_TABLET | Freq: Every day | ORAL | Status: DC
  Administered 2023-09-20 – 2023-09-22 (×3): 40 mg via ORAL
  Filled 2023-09-20 (×3): qty 1

## 2023-09-20 MED ORDER — DICLOFENAC SODIUM 1 % EX GEL
2.0000 g | Freq: Four times a day (QID) | CUTANEOUS | Status: DC
  Administered 2023-09-20 – 2023-09-21 (×2): 2 g via TOPICAL
  Filled 2023-09-20 (×2): qty 100

## 2023-09-20 MED ORDER — OXYCODONE HCL 5 MG PO TABS
5.0000 mg | ORAL_TABLET | Freq: Three times a day (TID) | ORAL | Status: DC | PRN
  Administered 2023-09-20 – 2023-09-22 (×7): 5 mg via ORAL
  Filled 2023-09-20 (×7): qty 1

## 2023-09-20 MED ORDER — UMECLIDINIUM BROMIDE 62.5 MCG/ACT IN AEPB
1.0000 | INHALATION_SPRAY | Freq: Every day | RESPIRATORY_TRACT | Status: DC
  Administered 2023-09-20 – 2023-09-21 (×2): 1 via RESPIRATORY_TRACT
  Filled 2023-09-20: qty 7

## 2023-09-20 MED ORDER — WARFARIN - PHARMACIST DOSING INPATIENT: Freq: Every day | Status: DC

## 2023-09-20 MED ORDER — FUROSEMIDE 10 MG/ML IJ SOLN
120.0000 mg | Freq: Once | INTRAVENOUS | Status: AC
  Administered 2023-09-20: 120 mg via INTRAVENOUS
  Filled 2023-09-20: qty 2

## 2023-09-20 MED ORDER — GABAPENTIN 300 MG PO CAPS
300.0000 mg | ORAL_CAPSULE | Freq: Two times a day (BID) | ORAL | Status: DC
Start: 2023-09-20 — End: 2023-09-22
  Administered 2023-09-20 – 2023-09-22 (×6): 300 mg via ORAL
  Filled 2023-09-20 (×6): qty 1

## 2023-09-20 MED ORDER — NICOTINE 14 MG/24HR TD PT24
14.0000 mg | MEDICATED_PATCH | Freq: Every day | TRANSDERMAL | Status: DC
  Administered 2023-09-20 – 2023-09-22 (×3): 14 mg via TRANSDERMAL
  Filled 2023-09-20 (×3): qty 1

## 2023-09-20 MED ORDER — FLUTICASONE FUROATE-VILANTEROL 100-25 MCG/ACT IN AEPB
1.0000 | INHALATION_SPRAY | Freq: Every day | RESPIRATORY_TRACT | Status: DC
  Administered 2023-09-20 – 2023-09-21 (×2): 1 via RESPIRATORY_TRACT
  Filled 2023-09-20: qty 28

## 2023-09-20 NOTE — Progress Notes (Signed)
 Mobility Specialist Progress Note:    09/20/23 1045  Mobility  Activity Transferred from bed to chair  Level of Assistance Minimal assist, patient does 75% or more  Assistive Device Front wheel walker  Distance Ambulated (ft) 3 ft  Activity Response Tolerated well  Mobility Referral Yes  Mobility visit 1 Mobility  Mobility Specialist Start Time (ACUTE ONLY) 1023  Mobility Specialist Stop Time (ACUTE ONLY) 1032  Mobility Specialist Time Calculation (min) (ACUTE ONLY) 9 min   Pt received in bed, daughter in room. Agreeable to mobility session. Transferred B>C via RW and MinA. Tolerated well, c/o "shakiness." Pt sitting up in chair with all needs met, call bell and phone in reach.    Feliciana Rossetti Mobility Specialist Please contact via Special educational needs teacher or  Rehab office at [phone]

## 2023-09-20 NOTE — Progress Notes (Signed)
 Interval history Admitted Given Lasix last night Remained volume overloaded post IV lasix   Physical exam Blood pressure 108/80, pulse 64, temperature 97.8 F (36.6 C), resp. rate 18, height 5\' 5"  (1.651 m), weight 72.6 kg, SpO2 99%.  Laying in Bed , NAD Normal  findings on neck exam, no masses palpated Heart sounds clear ,RRR Crackles noted don lungs exam . On 2L of [place] sating 93% Lower extremities showed +pretibial pitting edema     Weight change:   No intake or output data in the 24 hours ending 09/20/23 1133 Net IO Since Admission: No IO data has been entered for this period [09/20/23 1133]  Labs, images, and other studies    Latest Ref Rng & Units 09/20/2023    4:09 AM 09/19/2023    9:52 PM 09/14/2023   11:55 AM  CBC  WBC 4.0 - 10.5 K/uL 7.1  9.1  13.0   Hemoglobin 12.0 - 15.0 g/dL 9.3  9.6  13.2   Hematocrit 36.0 - 46.0 % 32.0  32.9  34.5   Platelets 150 - 400 K/uL 337  322  413        Latest Ref Rng & Units 09/20/2023    2:01 AM 09/19/2023    9:52 PM 09/14/2023   11:55 AM  CMP  Glucose 70 - 99 mg/dL 440  102  78   BUN 8 - 23 mg/dL 69  70  72   Creatinine 0.44 - 1.00 mg/dL 7.25  3.66  4.40   Sodium 135 - 145 mmol/L 135  135  136   Potassium 3.5 - 5.1 mmol/L 4.3  4.8  3.7   Chloride 98 - 111 mmol/L 91  92  101   CO2 22 - 32 mmol/L 28  28  25    Calcium 8.9 - 10.3 mg/dL 8.1  8.5  8.8     Assessment and plan Hospital day 1  Kim Anthony is a 73 y.o. person with a history of A-fib and mitral valve replacement on warfarin, COPD, biventricular heart failure, hypertension, hyperlipidemia, CKD stage IIIb, and left breast cancer status postmastectomy and radiation/chemotherapy presented with shortness of breath and lower extremity edema admitted for acute on chronic biventricular heart failure.  Principal Problem:   Acute combined systolic and diastolic heart failure (HCC) Active Problems:   COPD (chronic obstructive pulmonary disease) (HCC)   S/P  mitral valve replacement   Chronic anticoagulation   AKI (acute kidney injury) (HCC)   Supratherapeutic INR  Resolved Problems:   * No resolved hospital problems. *  #Acute on chronic biventricular heart failure #Elevated Trops Presented with shortness of breath and lower extremity edema in the setting of acute exacerbation of chronic biventricular heart failure after recently being discharged from the hospital.  Patient was sent home with 100 mg of torsemide daily for which she endorses complete adherence.  This patient's recent bounce back make me worry if she is taking her medications as prescribed vs inadequate dosing vs dietary choices that could be causing fluid retention in this patient.  I agree with Dr. Sherrilee Gilles on repeat ECHO even though I doubt it will change much. No signs of infection at this time. Trops peaked.  She looks volume overloaded on exam.  Will continue diuresing at this time. - IV lasix 120 mg once today - Daily weights - Strict In/Out  - Follow up on ECHO - Will resume  home GDMT after adequate diuresis  #AKI on CKD 3B Scr of 2.67 < 2.23 on presentation,likely cardio-renal syndrome. Will diurese given her cardiac function and findings on my exam. Will keep an eye on her kidney function while on Lasix  and adjust appropriately.  - Trend serum Cr - Monitor Urine output - Will consult Nephrology if Kidney function worsens   #Afib on [facility] #Mechanical mitral Valve On Warfarin. INR 7.2.  Will hold Warfarin  at this time until INR is therapeutic. No signs of active bleeding . Will not reverse INR at this time. Will consider that if patient start bleeding.   Chronic low back pain  Hx of postherpetic neuralgia  PTA on gabapentin 800 mg TID. Follows with PMR outpatient for home Norco 5-325 Q4H PRN.  -renally dose gabapentin given renal function . Appreciate Dr Chelsea Aus assistance  -tylenol PRN and oxycodone 5 mg Q4H PRN  HTN - Hold antihypertensive  HLD - Home  simvastatin Gout - Holding prednisone    VTE prophylaxis: Hold warfarin Diet: Sodium restriction diet IVF: None, IV Lasix Code: FULL  PT/OT recommendations: None  TOC recommendations: None  Family Update: Daughter to be updated   Discharge plan:  1-2 days after adequate diuresing    Kathleen Lime  MD 09/20/2023, 11:33 AM  Pager: [phone] After 5pm or weekend: [phone]

## 2023-09-20 NOTE — Progress Notes (Signed)
 PHARMACY - ANTICOAGULATION CONSULT NOTE  Pharmacy Consult for PTA warfarin Indication: mechanical MVR, afib  Allergies  Allergen Reactions   Lisinopril Swelling   Acetaminophen-Codeine Itching   Propoxyphene Itching    Darvocet   Tape Rash    Plastic   Tramadol Itching, Nausea And Vomiting and Rash    Patient Measurements:    Vital Signs: Temp: 98.2 F (36.8 C) (03/30 2130) Temp Source: Oral (03/30 2130) BP: 106/75 (03/30 2130) Pulse Rate: 60 (03/30 2130)  Labs: Recent Labs    09/19/23 2152  HGB 9.6*  HCT 32.9*  PLT 322  CREATININE 2.67*  TROPONINIHS 13    Estimated Creatinine Clearance: 19.3 mL/min (A) (by C-G formula based on SCr of 2.67 mg/dL (H)).   Medical History: Past Medical History:  Diagnosis Date   Acute delirium 08/30/2023   Acute GI bleeding 08/18/2023   Arthritis    Asthma    Breast cancer (HCC) 2001   Left Breast Cancer   CHF (congestive heart failure) (HCC)    COPD (chronic obstructive pulmonary disease) (HCC)    Coronary artery disease    GERD (gastroesophageal reflux disease)    History of mitral valve replacement with mechanical valve    HLD (hyperlipidemia)    Hypertension    Pulmonary embolism (HCC) 2021    Medications:   -Warfarin 5mg : Alternate between 5mg  PO and 2.5mg  PO every other day . Last dose 3/30 @0800  and 2.5mg  per patient  Assessment: 34 yoF presented to ED with worsening shortness of breath and leg swelling. PMH includes recent hospitalization for GIB > hemorrhagic/cardiogenic shock requiring ICU stay and discharged on 3/19. Pharmacy consulted to manage warfarin  Goal of Therapy:  INR 2.5-3.5 Monitor platelets by anticoagulation protocol: Yes   Plan:  -Follow up daily INR to base next dose of warfarin -Can continue warfarin as scheduled: alternating 5mg  and 2.5mg  every other day -CBC daily  [name], PharmD. Clinical Pharmacist 09/20/2023 2:23 AM

## 2023-09-20 NOTE — Progress Notes (Signed)
 Heart Failure Navigator Progress Note  Assessed for Heart & Vascular TOC clinic readiness.  Patient does not meet criteria due to Advanced Heart Failure Team patient of Dr. Gala Romney. .   Navigator will sign off at this time.   Rhae Hammock, BSN, Scientist, clinical (histocompatibility and immunogenetics) Only

## 2023-09-20 NOTE — Progress Notes (Signed)
 PHARMACY - ANTICOAGULATION CONSULT NOTE  Pharmacy Consult for PTA warfarin Indication: mechanical MVR, afib  Allergies  Allergen Reactions   Lisinopril Swelling   Acetaminophen-Codeine Itching   Propoxyphene Itching    Darvocet   Tape Rash    Plastic   Tramadol Itching, Nausea And Vomiting and Rash    Patient Measurements: Height: 5\' 5"  (165.1 cm) Weight: 72.6 kg (160 lb) IBW/kg (Calculated) : 57 HEPARIN DW (KG): 71.6  Vital Signs: Temp: 98.4 F (36.9 C) (03/31 0641) Temp Source: Oral (03/31 0641) BP: 121/71 (03/31 0641) Pulse Rate: 69 (03/31 0641)  Labs: Recent Labs    09/19/23 2152 09/20/23 0116 09/20/23 0201 09/20/23 0409  HGB 9.6*  --   --  9.3*  HCT 32.9*  --   --  32.0*  PLT 322  --   --  337  LABPROT  --   --   --  61.7*  INR  --   --   --  7.2*  CREATININE 2.67*  --  2.70*  --   TROPONINIHS 13 265* 14 12    Estimated Creatinine Clearance: 18.5 mL/min (A) (by C-G formula based on SCr of 2.7 mg/dL (H)).   Medical History: Past Medical History:  Diagnosis Date   Acute delirium 08/30/2023   Acute GI bleeding 08/18/2023   Arthritis    Asthma    Breast cancer (HCC) 2001   Left Breast Cancer   CHF (congestive heart failure) (HCC)    COPD (chronic obstructive pulmonary disease) (HCC)    Coronary artery disease    GERD (gastroesophageal reflux disease)    History of mitral valve replacement with mechanical valve    HLD (hyperlipidemia)    Hypertension    Pulmonary embolism (HCC) 2021    Medications:   -Warfarin 5mg : Alternate between 5mg  PO and 2.5mg  PO every other day . Last dose 3/30 @0800  and 2.5mg  per patient  Assessment: 37 yoF presented to ED with worsening shortness of breath and leg swelling. PMH includes recent hospitalization for GIB > hemorrhagic/cardiogenic shock requiring ICU stay and discharged on 3/19. Pharmacy consulted to manage warfarin  Goal of Therapy:  INR 2.5-3.5 Monitor platelets by anticoagulation protocol: Yes    Plan:  - no coumadin on 3/31 (INR 7.2) -CBC/INR daily Monitor for signs of bleeding   Siddiq Kaluzny BS, PharmD, BCPS Clinical Pharmacist 09/20/2023 7:02 AM  Contact: [phone] after 3 PM  "Be curious, not judgmental..." -Debbora Dus

## 2023-09-21 DIAGNOSIS — N1832 Chronic kidney disease, stage 3b: Secondary | ICD-10-CM | POA: Diagnosis not present

## 2023-09-21 DIAGNOSIS — R791 Abnormal coagulation profile: Secondary | ICD-10-CM | POA: Diagnosis not present

## 2023-09-21 DIAGNOSIS — I5041 Acute combined systolic (congestive) and diastolic (congestive) heart failure: Secondary | ICD-10-CM | POA: Diagnosis not present

## 2023-09-21 LAB — CBC
HCT: 33.3 % — ABNORMAL LOW (ref 36.0–46.0)
Hemoglobin: 9.8 g/dL — ABNORMAL LOW (ref 12.0–15.0)
MCH: 27.9 pg (ref 26.0–34.0)
MCHC: 29.4 g/dL — ABNORMAL LOW (ref 30.0–36.0)
MCV: 94.9 fL (ref 80.0–100.0)
Platelets: 323 10*3/uL (ref 150–400)
RBC: 3.51 MIL/uL — ABNORMAL LOW (ref 3.87–5.11)
RDW: 22.2 % — ABNORMAL HIGH (ref 11.5–15.5)
WBC: 8.9 10*3/uL (ref 4.0–10.5)
nRBC: 0.7 % — ABNORMAL HIGH (ref 0.0–0.2)

## 2023-09-21 LAB — PROTIME-INR
INR: 5.4 (ref 0.8–1.2)
Prothrombin Time: 49.5 s — ABNORMAL HIGH (ref 11.4–15.2)

## 2023-09-21 LAB — BASIC METABOLIC PANEL WITH GFR
Anion gap: 16 — ABNORMAL HIGH (ref 5–15)
BUN: 71 mg/dL — ABNORMAL HIGH (ref 8–23)
CO2: 27 mmol/L (ref 22–32)
Calcium: 8.1 mg/dL — ABNORMAL LOW (ref 8.9–10.3)
Chloride: 91 mmol/L — ABNORMAL LOW (ref 98–111)
Creatinine, Ser: 2.64 mg/dL — ABNORMAL HIGH (ref 0.44–1.00)
GFR, Estimated: 19 mL/min — ABNORMAL LOW (ref >60)
Glucose, Bld: 107 mg/dL — ABNORMAL HIGH (ref 70–99)
Potassium: 4 mmol/L (ref 3.5–5.1)
Sodium: 134 mmol/L — ABNORMAL LOW (ref 135–145)

## 2023-09-21 MED ORDER — FUROSEMIDE 10 MG/ML IJ SOLN
120.0000 mg | Freq: Once | INTRAVENOUS | Status: AC
  Administered 2023-09-21: 120 mg via INTRAVENOUS
  Filled 2023-09-21: qty 10

## 2023-09-21 MED ORDER — POTASSIUM CHLORIDE CRYS ER 20 MEQ PO TBCR
40.0000 meq | EXTENDED_RELEASE_TABLET | Freq: Once | ORAL | Status: AC
  Administered 2023-09-21: 40 meq via ORAL
  Filled 2023-09-21: qty 2

## 2023-09-21 MED ORDER — HYDROXYZINE HCL 10 MG PO TABS
10.0000 mg | ORAL_TABLET | Freq: Three times a day (TID) | ORAL | Status: AC | PRN
  Administered 2023-09-21 – 2023-09-22 (×3): 10 mg via ORAL
  Filled 2023-09-21 (×3): qty 1

## 2023-09-21 MED ORDER — COLCHICINE 0.3 MG HALF TABLET
0.3000 mg | ORAL_TABLET | Freq: Every day | ORAL | Status: DC
  Administered 2023-09-21: 0.3 mg via ORAL
  Filled 2023-09-21: qty 1

## 2023-09-21 NOTE — Progress Notes (Signed)
 Interval history Today looks less volume overloaded on exam, though she reports her SOB, wheezing, cough has not improved, she feels stable. She reports new left sided neck pain since last night that worsens when she moves her neck. She also has new anxiety about her life potentially ending. She denies blood in her stool. She denies any gout flare.  Physical exam Blood pressure 90/78, pulse 63, temperature 98.7 F (37.1 C), temperature source Oral, resp. rate 18, height 5\' 5"  (1.651 m), weight 72.6 kg, SpO2 96%.  O2 sat dropped to 84-88 in the room and the oxygen was not on  General appearance: laying down, alert, cooperative, anxious Neck: Mass on the L side of her neck from previous DVT is noted. No rashes on R side of the neck. Resp: Mild crackles in b/l L lung base  Cardio: Regular rate and rhythm, mechanical mitral valve click  GI: No tenderness Extremities: No LE edema  Weight change: 0.024 kg   Intake/Output Summary (Last 24 hours) at 09/21/2023 1044 Last data filed at 09/21/2023 0900 Gross per 24 hour  Intake 600 ml  Output 1100 ml  Net -500 ml   Net IO Since Admission: -500 mL [09/21/23 1044]  Labs, images, and other studies    Latest Ref Rng & Units 09/21/2023    4:53 AM 09/20/2023    4:09 AM 09/19/2023    9:52 PM  CBC  WBC 4.0 - 10.5 K/uL 8.9  7.1  9.1   Hemoglobin 12.0 - 15.0 g/dL 9.8  9.3  9.6   Hematocrit 36.0 - 46.0 % 33.3  32.0  32.9   Platelets 150 - 400 K/uL 323  337  322        Latest Ref Rng & Units 09/21/2023    4:53 AM 09/20/2023    2:01 AM 09/19/2023    9:52 PM  CMP  Glucose 70 - 99 mg/dL 782  956  213   BUN 8 - 23 mg/dL 71  69  70   Creatinine 0.44 - 1.00 mg/dL 0.86  5.78  4.69   Sodium 135 - 145 mmol/L 134  135  135   Potassium 3.5 - 5.1 mmol/L 4.0  4.3  4.8   Chloride 98 - 111 mmol/L 91  91  92   CO2 22 - 32 mmol/L 27  28  28    Calcium 8.9 - 10.3 mg/dL 8.1  8.1  8.5    Echo from 09/20/2023 was Stable Echocardiogram from 08/18/2023,  EF 55-60%    Assessment and plan Hospital day 2  Kim Anthony is a 73 y.o. person with a biventricular heart failure, history of A-fib and mitral valve replacement on warfarin, COPD, HTN, HLD, CKD stage IIIb, and left breast cancer status postmastectomy and radiation/chemotherapy presented with shortness of breath and lower extremity edema admitted for acute on chronic biventricular heart failure.  Principal Problem:   Acute combined systolic and diastolic heart failure (HCC) Active Problems:   COPD (chronic obstructive pulmonary disease) (HCC)   S/P mitral valve replacement   Chronic anticoagulation   AKI (acute kidney injury) (HCC)   Supratherapeutic INR   Stage 3b chronic kidney disease (HCC)  Resolved Problems:   * No resolved hospital problems. *  #Acute on chronic biventricular heart failure #Elevated Trops Echo EF 55-60, stable. On exam she looked improved volume wise compared to yesterday, though she reports no improvement in her  sx today. Weight stable from yesterday and I/Os only net -500 so likely she is still retaining excess volume. In the room her [place] was not fully connected to her O2 and her oxygen dropped to the high 80s while we were in the room. Will continue diuresing at this time until she feels better subjectively. - Continue IV lasix 120 mg once today - Daily weights - Strict In/Out  - Will resume home GDMT after adequate diuresis  #AKI on CKD 3B Scr stable. Likely cardio-renal syndrome. Will continue to diurese given her cardiac function and findings on my exam. Will keep an eye on her kidney function while on Lasix  and adjust appropriately.  - Trend serum Cr - F/U BMP - Will consult Nephrology if Kidney function worsens   #Afib on [facility] #Mechanical mitral Valve On Warfarin. INR improved today was 5.4 -> 7.2.  Will continue to hold Warfarin at this time until INR is therapeutic. No signs of active bleeding. Will not reverse INR at this time. Will consider  that if patient starts bleeding.  -Holding Warfarin: Per pharmacy: Warfarin schedule is alternating 5mg  and 2.5mg  every other day  -F/U PT/INR  -F/U CBC   #Anemia, Hx of Upper GI bleed INR improving, H/H baseline, no signs of bleeding as above.  - Continue home Protonix  #Anxiety  Her new onset anxiety is 2/2 to her feelings towards her health overall. - Hydroxyzine 10mg   PRN  #Gout  Holding prednisone, she is not in an acute flare.   #Chronic conditions COPD: Continue Breo Ellipta and Incruse Postherpetic neuralgia: Continue gabapentin 300mg  HTN: Hold antihypertensives for now  HLD: Continue Home simvastatin Tobacco use disorder: NRT   VTE prophylaxis: Hold warfarin Diet: Sodium restriction diet IVF: None, IV Lasix Code: FULL  PT/OT recommendations: Will wait for recommendations  TOC recommendations: None  Family Update: Daughter [name] to be updated   Discharge plan:  1-2 days after adequate diuresing   This is a Psychologist, occupational Note.  The care of the patient was discussed with Dr. Mickie Bail and the assessment and plan formulated with their assistance.  Please see their attached note for official documentation of the daily encounter.  Kim Lime  MD 09/21/2023, 10:44 AM  Pager: [phone] After 5pm or weekend: [phone]

## 2023-09-21 NOTE — Progress Notes (Deleted)
  Internal Medicine Attending:   I personally saw and examined the patient. Discussed with the resident and agree with the resident's findings and plan of care as documented in the resident's note. Cr is unchanged INR has dropped, appreciate pharm f/u.  Will work with her on her anxiety.  Ginnie Smart, MD

## 2023-09-21 NOTE — Progress Notes (Signed)
 Mobility Specialist Progress Note:   09/21/23 1030  Mobility  Activity Transferred from bed to chair  Level of Assistance Modified independent, requires aide device or extra time  Distance Ambulated (ft) 5 ft  Activity Response Tolerated well  Mobility Referral Yes  Mobility visit 1 Mobility  Mobility Specialist Start Time (ACUTE ONLY) 1025  Mobility Specialist Stop Time (ACUTE ONLY) 1034  Mobility Specialist Time Calculation (min) (ACUTE ONLY) 9 min   Pt received in bed, agreeable to mobility session. ModI required to stand and pivot to chair. Tolerated well, c/o wheezing and L shoulder pain. Left pt in chair with call bell and all needs met. RN notified.    Feliciana Rossetti Mobility Specialist Please contact via Special educational needs teacher or  Rehab office at [phone]

## 2023-09-21 NOTE — Progress Notes (Signed)
 PHARMACY - ANTICOAGULATION CONSULT NOTE  Pharmacy Consult for PTA warfarin Indication: mechanical MVR, afib  Allergies  Allergen Reactions   Lisinopril Swelling   Acetaminophen-Codeine Itching   Propoxyphene Itching    Darvocet   Tape Rash    Plastic   Tramadol Itching, Nausea And Vomiting and Rash    Patient Measurements: Height: 5\' 5"  (165.1 cm) Weight: 72.6 kg (160 lb 0.9 oz) IBW/kg (Calculated) : 57 HEPARIN DW (KG): 71.6  Vital Signs: Temp: 97.8 F (36.6 C) (04/01 0428) Temp Source: Oral (04/01 0428) BP: 113/75 (04/01 0428) Pulse Rate: 57 (04/01 0428)  Labs: Recent Labs    09/19/23 2152 09/20/23 0116 09/20/23 0201 09/20/23 0409 09/21/23 0453  HGB 9.6*  --   --  9.3* 9.8*  HCT 32.9*  --   --  32.0* 33.3*  PLT 322  --   --  337 323  LABPROT  --   --   --  61.7* 49.5*  INR  --   --   --  7.2* 5.4*  CREATININE 2.67*  --  2.70*  --  2.64*  TROPONINIHS 13 265* 14 12  --     Estimated Creatinine Clearance: 18.9 mL/min (A) (by C-G formula based on SCr of 2.64 mg/dL (H)).   Medical History: Past Medical History:  Diagnosis Date   Acute delirium 08/30/2023   Acute GI bleeding 08/18/2023   Arthritis    Asthma    Breast cancer (HCC) 2001   Left Breast Cancer   CHF (congestive heart failure) (HCC)    COPD (chronic obstructive pulmonary disease) (HCC)    Coronary artery disease    GERD (gastroesophageal reflux disease)    History of mitral valve replacement with mechanical valve    HLD (hyperlipidemia)    Hypertension    Pulmonary embolism (HCC) 2021    Medications:   -Warfarin 5mg : Alternate between 5mg  PO and 2.5mg  PO every other day . Last dose 3/30 @0800  and 2.5mg  per patient  Assessment: 27 yoF presented to ED with worsening shortness of breath and leg swelling found to have HF exacerbation. PMH includes recent hospitalization for GIB > hemorrhagic/cardiogenic shock requiring ICU stay and discharged on 3/19. INR on 3/25 was 5.4 and patient  instructed to hold 1 dose then restart at 5mg  alternating with 2.5mg  every other day. Admit INR 7.2, no active bleeding. Pharmacy consulted to manage warfarin.  Goal of Therapy:  INR 2.5-3.5 Monitor platelets by anticoagulation protocol: Yes   Plan:  Hold warfarin until INR ~3.5 Monitor daily INR, CBC, signs/symptoms of bleeding    Alphia Moh, PharmD, BCPS, BCCP Clinical Pharmacist  Please check AMION for all [facility] Pharmacy phone numbers After 10:00 PM, call Main Pharmacy [phone]

## 2023-09-22 ENCOUNTER — Other Ambulatory Visit: Payer: Self-pay | Admitting: Student

## 2023-09-22 DIAGNOSIS — I5041 Acute combined systolic (congestive) and diastolic (congestive) heart failure: Secondary | ICD-10-CM | POA: Diagnosis not present

## 2023-09-22 DIAGNOSIS — N179 Acute kidney failure, unspecified: Secondary | ICD-10-CM | POA: Diagnosis not present

## 2023-09-22 LAB — BASIC METABOLIC PANEL WITH GFR
Anion gap: 13 (ref 5–15)
BUN: 65 mg/dL — ABNORMAL HIGH (ref 8–23)
CO2: 27 mmol/L (ref 22–32)
Calcium: 8 mg/dL — ABNORMAL LOW (ref 8.9–10.3)
Chloride: 93 mmol/L — ABNORMAL LOW (ref 98–111)
Creatinine, Ser: 1.94 mg/dL — ABNORMAL HIGH (ref 0.44–1.00)
GFR, Estimated: 27 mL/min — ABNORMAL LOW (ref >60)
Glucose, Bld: 96 mg/dL (ref 70–99)
Potassium: 4.1 mmol/L (ref 3.5–5.1)
Sodium: 133 mmol/L — ABNORMAL LOW (ref 135–145)

## 2023-09-22 LAB — PROTIME-INR
INR: 3.6 — ABNORMAL HIGH (ref 0.8–1.2)
Prothrombin Time: 36.3 s — ABNORMAL HIGH (ref 11.4–15.2)

## 2023-09-22 LAB — CBC
HCT: 34.2 % — ABNORMAL LOW (ref 36.0–46.0)
Hemoglobin: 10.2 g/dL — ABNORMAL LOW (ref 12.0–15.0)
MCH: 28.3 pg (ref 26.0–34.0)
MCHC: 29.8 g/dL — ABNORMAL LOW (ref 30.0–36.0)
MCV: 94.7 fL (ref 80.0–100.0)
Platelets: 332 10*3/uL (ref 150–400)
RBC: 3.61 MIL/uL — ABNORMAL LOW (ref 3.87–5.11)
RDW: 21.6 % — ABNORMAL HIGH (ref 11.5–15.5)
WBC: 7.4 10*3/uL (ref 4.0–10.5)
nRBC: 0.5 % — ABNORMAL HIGH (ref 0.0–0.2)

## 2023-09-22 MED ORDER — WARFARIN SODIUM 2 MG PO TABS: 2.0000 mg | ORAL_TABLET | Freq: Once | ORAL | Status: DC

## 2023-09-22 MED ORDER — TORSEMIDE 20 MG PO TABS
120.0000 mg | ORAL_TABLET | Freq: Every day | ORAL | 3 refills | Status: DC
Start: 2023-09-22 — End: 2023-10-08

## 2023-09-22 MED ORDER — HYDROXYZINE HCL 10 MG PO TABS: 10.0000 mg | ORAL_TABLET | Freq: Three times a day (TID) | ORAL | 0 refills | Status: DC | PRN

## 2023-09-22 MED ORDER — WARFARIN SODIUM 5 MG PO TABS: ORAL_TABLET | ORAL | Status: DC

## 2023-09-22 MED ORDER — HYDROXYZINE HCL 10 MG PO TABS: 10.0000 mg | ORAL_TABLET | Freq: Three times a day (TID) | ORAL | Status: DC | PRN

## 2023-09-22 NOTE — Evaluation (Signed)
 Occupational Therapy Evaluation Patient Details Name: Kim Anthony [name]: [id number] DOB: [birth year] Today's Date: 09/22/2023   History of Present Illness   Pt is 73 yo female presenting 09/19/23 with 3 day history of SOB, orthopnea, and a non-productive cough. Admitted with systolic and diastolic CHF. Of note, pt was recently admitted 2/25-3/19/25 for a GI bleed and cardiogenic shock. PMH: gout, CAD, HFrEF, a fib, HTN, GERD, HLD, COPD, hx of breast cancer s/p L mastectomy, AKI, and radiation/chemo.     Clinical Impressions Patient seen for OT evaluation prior to discharge. Patient found to be lethargic, with O2 off. Patient's O2 assessed at 83% on RA, and patient wears 2L oxygen at baseline. RN notified and placed on 2L O2 with saturations as high as 93%. Patient also confused and minimally disoriented, often repeating herself. Patient stating that she is highly sensitive to pain medication. MD and care team notified after session. Patient is currently mod A for ADL management. Given current level, patient is unsafe to return home unless she has 24/7 assist. If 24/7 assist can be provided, patient can return home with the recommendation made for home heath OT. If unable, patient would benefit from short term stint of rehab < 3 hours prior to return home.      If plan is discharge home, recommend the following:   A little help with walking and/or transfers;A lot of help with bathing/dressing/bathroom;Assistance with cooking/housework;Direct supervision/assist for medications management;Direct supervision/assist for financial management;Assist for transportation;Help with stairs or ramp for entrance;Supervision due to cognitive status     Functional Status Assessment   Patient has had a recent decline in their functional status and demonstrates the ability to make significant improvements in function in a reasonable and predictable amount of time.     Equipment Recommendations   None  recommended by OT     Recommendations for Other Services         Precautions/Restrictions   Precautions Precautions: Fall Recall of Precautions/Restrictions: Impaired Precaution/Restrictions Comments: monitor O2 Restrictions Weight Bearing Restrictions Per Provider Order: No     Mobility Bed Mobility Overal bed mobility: Needs Assistance Bed Mobility: Supine to Sit     Supine to sit: Supervision, HOB elevated, Used rails     General bed mobility comments: up in recliner upon OT entry    Transfers Overall transfer level: Needs assistance Equipment used: Rolling walker (2 wheels) Transfers: Sit to/from Stand Sit to Stand: Contact guard assist           General transfer comment: not assessed due to other acute needs present, please refer to PT note      Balance Overall balance assessment: Needs assistance Sitting-balance support: Feet supported, No upper extremity supported Sitting balance-Leahy Scale: Good Sitting balance - Comments: pt able to sit EOB and semi-long sit to put her R sock on halfway, unable to reach and put on L sock in the similar fashion. No LOB.   Standing balance support: Bilateral upper extremity supported, During functional activity Standing balance-Leahy Scale: Poor Standing balance comment: pt stands with UEs on RW and increased trunk flexion.                           ADL either performed or assessed with clinical judgement   ADL Overall ADL's : Needs assistance/impaired Eating/Feeding: Set up;Sitting   Grooming: Set up;Sitting   Upper Body Bathing: Contact guard assist;Sitting   Lower Body Bathing: Maximal assistance;Sit to/from stand;Sitting/lateral leans  Upper Body Dressing : Contact guard assist;Sitting   Lower Body Dressing: Maximal assistance;Sitting/lateral leans;Sit to/from stand Lower Body Dressing Details (indicate cue type and reason): unable to don socks, reports she is more stiff than normal    Toilet Transfer Details (indicate cue type and reason): did not assess due to confusion and oxygen needs, please refer to PT note Toileting- Clothing Manipulation and Hygiene: Contact guard assist;Sitting/lateral lean;Sit to/from stand       Functional mobility during ADLs: Moderate assistance;Cueing for sequencing;Cueing for safety General ADL Comments: Patient seen for OT evaluation prior to discharge. Patient found to be lethargic, with O2 off. Patient's O2 assessed at 83% on RA, and patient wears 2L oxygen at baseline. RN notified and placed on 2L O2 with saturations as high as 93%. Patient also confused and minimally disoriented, often repeating herself. Patient stating that she is highly sensitive to pain medication. MD and care team notified after session. Patient is currently mod A for ADL management. Given current level, patient is unsafe to return home unless she has 24/7 assist. If 24/7 assist can be provided, patient can return home with the recommendation made for home heath OT. If unable, patient would benefit from short term stint of rehab < 3 hours prior to return home.     Vision Baseline Vision/History: 1 Wears glasses Ability to See in Adequate Light: 0 Adequate Patient Visual Report: No change from baseline       Perception Perception: Not tested       Praxis Praxis: Not tested       Pertinent Vitals/Pain Pain Assessment Pain Assessment: No/denies pain Pain Intervention(s): Monitored during session, Limited activity within patient's tolerance, Repositioned     Extremity/Trunk Assessment Upper Extremity Assessment Upper Extremity Assessment: Generalized weakness;Right hand dominant;RUE deficits/detail;LUE deficits/detail RUE Deficits / Details: discomfort at R shoulder, AROM to 110 degrees, states this is her baseline RUE: Shoulder pain with ROM;Unable to fully assess due to pain RUE Sensation: WNL RUE Coordination: decreased gross motor;decreased fine  motor LUE Deficits / Details: decreased AROM at shoulder in flexion and external rotation, pain noted, less than 80 degrees shoulder flexion, 40 degrees external rotation LUE: Shoulder pain with ROM;Unable to fully assess due to pain LUE Sensation: WNL LUE Coordination: decreased fine motor;decreased gross motor   Lower Extremity Assessment Lower Extremity Assessment: Defer to PT evaluation   Cervical / Trunk Assessment Cervical / Trunk Assessment: Kyphotic   Communication Communication Communication: Impaired Factors Affecting Communication: Reduced clarity of speech   Cognition Arousal: Lethargic Behavior During Therapy: WFL for tasks assessed/performed Cognition: Cognition impaired   Orientation impairments: Time Awareness: Intellectual awareness impaired Memory impairment (select all impairments): Short-term memory, Working memory Attention impairment (select first level of impairment): Focused attention Executive functioning impairment (select all impairments): Initiation, Organization, Sequencing, Reasoning, Problem solving OT - Cognition Comments: Patient lethargic upon arrival, no O2 on and satting at 83%, placed on 2L O2 and RN notified, patient also admitting that some pain medications make her loopy and drowsy, admits that she forgets her medications 2x/week but her daughter manages medications at home, minimally tangential, and requiring cues to maintain attention to task                 Following commands: Impaired Following commands impaired: Follows one step commands inconsistently, Follows one step commands with increased time     Cueing  General Comments   Cueing Techniques: Verbal cues;Tactile cues      Exercises     Shoulder  Instructions      Home Living Family/patient expects to be discharged to:: Private residence Living Arrangements: Children Available Help at Discharge: Family;Available PRN/intermittently Type of Home: House Home Access:  Stairs to enter [employer] of Steps: 8 Entrance Stairs-Rails: Right;Left;Can reach both Home Layout: One level     Bathroom Shower/Tub: Chief Strategy Officer: Handicapped height Bathroom Accessibility: Yes   Home Equipment: Rollator (4 wheels);Rolling Walker (2 wheels);Cane - single point;BSC/3in1;Shower seat;Grab bars - tub/shower   Additional Comments: Pt reports no changes in her home living situation since her last admission. Although she does claim that she lives with her nephew, daughter, and another family member and changes who she lives with perdiocally. Daughter works full time      Prior Functioning/Environment Prior Level of Function : Needs assist       Physical Assist : Mobility (physical);ADLs (physical) Mobility (physical): Gait;Stairs ADLs (physical): IADLs Mobility Comments: Mod I with RW or SPC ADLs Comments: daughter manages medications, admits she forgets to take her medications 2x/week    OT Problem List: Decreased strength;Decreased range of motion;Decreased activity tolerance;Decreased coordination;Decreased cognition;Decreased safety awareness;Decreased knowledge of precautions;Cardiopulmonary status limiting activity;Pain;Impaired UE functional use   OT Treatment/Interventions: Therapeutic exercise;Self-care/ADL training;Energy conservation;DME and/or AE instruction;Manual therapy;Therapeutic activities;Cognitive remediation/compensation;Patient/family education;Balance training      OT Goals(Current goals can be found in the care plan section)   Acute Rehab OT Goals Patient Stated Goal: to get better OT Goal Formulation: Patient unable to participate in goal setting Time For Goal Achievement: 10/06/23 Potential to Achieve Goals: Good   OT Frequency:  Min 2X/week    Co-evaluation              AM-PAC OT "6 Clicks" Daily Activity     Outcome Measure Help from another person eating meals?: A Little Help from another  person taking care of personal grooming?: A Little Help from another person toileting, which includes using toliet, bedpan, or urinal?: A Lot Help from another person bathing (including washing, rinsing, drying)?: A Lot Help from another person to put on and taking off regular upper body clothing?: A Little Help from another person to put on and taking off regular lower body clothing?: A Lot 6 Click Score: 15   End of Session Equipment Utilized During Treatment: Oxygen Nurse Communication: Mobility status;Other (comment) (Oxygen and confusion)  Activity Tolerance: Other (comment) (Confusion) Patient left: in chair;with call bell/phone within reach;with chair alarm set  OT Visit Diagnosis: Unsteadiness on feet (R26.81);Other abnormalities of gait and mobility (R26.89);Muscle weakness (generalized) (M62.81);Other symptoms and signs involving cognitive function;Pain Pain - Right/Left: Left Pain - part of body: Shoulder                Time: 7829-5621 OT Time Calculation (min): 23 min Charges:  OT General Charges $OT Visit: 1 Visit OT Evaluation $OT Eval Moderate Complexity: 1 Mod OT Treatments $Self Care/Home Management : 8-22 mins  Pollyann Glen E. Brittnae Aschenbrenner, OTR/L Acute Rehabilitation Services [phone]   Cherlyn Cushing 09/22/2023, 2:04 PM

## 2023-09-22 NOTE — Discharge Summary (Signed)
 Name: Kim Anthony [name]: [id number] DOB: Dec 10, 1950 73 y.o. PCP: Kathleen Lime, MD  Date of Admission: 09/19/2023  5:44 PM Date of Discharge: 09/22/2023 10:34 AM Attending Physician: Dr.  Ninetta Lights  Discharge Diagnosis: Principal Problem:   Acute combined systolic and diastolic heart failure (HCC) Active Problems:   COPD (chronic obstructive pulmonary disease) (HCC)   S/P mitral valve replacement   Chronic anticoagulation   AKI (acute kidney injury) (HCC)   Supratherapeutic INR   Stage 3b chronic kidney disease (HCC)    Discharge Medications: Allergies as of 09/22/2023       Reactions   Lisinopril Swelling   Acetaminophen-codeine Itching   Propoxyphene Itching   Darvocet   Tape Rash   Plastic   Tramadol Itching, Nausea And Vomiting, Rash        Medication List     STOP taking these medications    predniSONE 20 MG tablet Commonly known as: DELTASONE       TAKE these medications    albuterol 108 (90 Base) MCG/ACT inhaler Commonly known as: VENTOLIN HFA Inhale 1 puff into the lungs every 4 (four) hours as needed for wheezing or shortness of breath.   Farxiga 10 MG Tabs tablet Generic drug: dapagliflozin propanediol Take 1 tablet by mouth once daily   fluticasone 50 MCG/ACT nasal spray Commonly known as: Flonase Place 2 sprays into both nostrils daily. What changed: how much to take   gabapentin 800 MG tablet Commonly known as: Neurontin Take 1 tablet (800 mg total) by mouth 3 (three) times daily.   HYDROcodone-acetaminophen 5-325 MG tablet Commonly known as: NORCO/VICODIN Take 1 tablet by mouth every 4 (four) hours as needed for moderate pain (pain score 4-6). Diagnosis- chronic low back pain   losartan 25 MG tablet Commonly known as: COZAAR Take 0.5 tablets (12.5 mg total) by mouth daily.   metoprolol succinate 25 MG 24 hr tablet Commonly known as: TOPROL-XL Take 1 tablet (25 mg total) by mouth 2 (two) times daily.   potassium chloride 10 MEQ  tablet Commonly known as: KLOR-CON M Take 2 tablets (20 mEq total) by mouth daily.   simvastatin 20 MG tablet Commonly known as: ZOCOR Take 1 tablet (20 mg total) by mouth daily.   tetrahydrozoline 0.05 % ophthalmic solution Place 1 drop into both eyes 3 (three) times daily.   torsemide 20 MG tablet Commonly known as: DEMADEX Take 6 tablets (120 mg total) by mouth daily. What changed:  medication strength how much to take   Trelegy Ellipta 100-62.5-25 MCG/ACT Aepb Generic drug: Fluticasone-Umeclidin-Vilant Inhale 1 puff into the lungs daily.   warfarin 5 MG tablet Commonly known as: COUMADIN Take as directed. If you are unsure how to take this medication, talk to your nurse or doctor. Original instructions: Take 2.5mg  (half tablet) daily except on Saturday, take 5mg  (1 tablet). Get an INR check by Monday 09/27/23 What changed:  how much to take how to take this when to take this additional instructions        Disposition and follow-up:   Kim Anthony was discharged from [facility] in Good condition.  At the hospital follow up visit please address:  1.  Follow-up:  For her acute on chronic heart failure, she was discharged home on  120 mg of torsemide daily.  Kindly assess her medication adherence and her volume status.  Repeat BMP and adjust torsemide as needed.  For her A-fib and mechanical mitral valve on Coumadin, please recheck INR  and follow-up with Dr. Saralyn Pilar recommendation   2.  Labs / imaging needed at time of follow-up: INR  3.  Pending labs/ test needing follow-up: None  4.  Medication Changes   MODIFIED  - Torsemide increased from 100 mg to 120 mg daily    Follow-up Appointments:  Follow-up Information     Kathleen Lime, MD Follow up.   Specialty: Internal Medicine Contact information: [address] [place] [address] [phone]                 Hospital Course by problem list: Acute on chronic  biventricular heart failure Kim. Grabel presented to [place] with worsening shortness of breath on exertion and lower extremity edema.  On presentation she was volume overloaded on exam and requiring 2 to 3 L of oxygen through nasal cannula.  She was diuresed with IV Lasix until her breathing and volume status improved.  Her home torsemide was increased from 100-120 prior to discharge. Patient was discharged home with home health,PT/OT/RN  #AKI on CKD 3B Creatinine on admission of 2.67 with GFR 18 from baseline of 1.4-1.9. Her Scr improved to 1.94 on discharge.She was making urine and didn't endorse any urinary symptoms.    #Afib on [facility] #Mechanical mitral Valve INR on presentation was 7.2.  Her warfarin was held until her INR went down to 3.6 at discharge.Her warfarin was resumed at 2.5 mg and plan to follow up with Dr Alexandria Lodge   Discharge Subjective: Kim Anthony was seen at bedside this morning. She was siting in chair. Reports her anxiety had gotten better with the hydroxyzine and her breathing has improved as well.   Discharge Exam:   Blood pressure 133/79, pulse 68, temperature 98.1 F (36.7 C), temperature source Oral, resp. rate 20, height 5\' 5"  (1.651 m), weight 72.6 kg, SpO2 95%.  Constitutional:well-appearing woman, sitting in chair , in no acute distress HENT: normocephalic atraumatic, mucous membranes moist Cardiovascular: regular rate and rhythm, no m/r/g, no JVD Pulmonary/Chest: normal work of breathing on room air, lungs clear to auscultation bilaterally. Mild crackles  Abdominal: soft, non-tender, non-distended.  Neurological: alert & oriented x 3 MSK: no gross abnormalities. No pitting edema Skin: warm and dry Psych: Normal mood and affect  Pertinent Labs, Studies, and Procedures:     Latest Ref Rng & Units 09/22/2023    3:34 AM 09/21/2023    4:53 AM 09/20/2023    4:09 AM  CBC  WBC 4.0 - 10.5 K/uL 7.4  8.9  7.1   Hemoglobin 12.0 - 15.0 g/dL 91.4  9.8  9.3   Hematocrit 36.0 - 46.0 %  34.2  33.3  32.0   Platelets 150 - 400 K/uL 332  323  337        Latest Ref Rng & Units 09/22/2023    6:40 AM 09/21/2023    4:53 AM 09/20/2023    2:01 AM  CMP  Glucose 70 - 99 mg/dL 96  782  956   BUN 8 - 23 mg/dL 65  71  69   Creatinine 0.44 - 1.00 mg/dL 2.13  0.86  5.78   Sodium 135 - 145 mmol/L 133  134  135   Potassium 3.5 - 5.1 mmol/L 4.1  4.0  4.3   Chloride 98 - 111 mmol/L 93  91  91   CO2 22 - 32 mmol/L 27  27  28    Calcium 8.9 - 10.3 mg/dL 8.0  8.1  8.1     ECHOCARDIOGRAM LIMITED  Result Date: 09/20/2023    ECHOCARDIOGRAM LIMITED REPORT   Patient Name:   ADAHLIA STEMBRIDGE Date of Exam: 09/20/2023 Medical Rec #:  [id number]     Height:       65.0 in Accession #:    [id number]    Weight:       160.0 lb Date of Birth:  1950/07/28      BSA:          1.799 m Patient Age:    72 years      BP:           102/74 mmHg Patient Gender: F             HR:           57 bpm. Exam Location:  Inpatient Procedure: Limited Echo, Cardiac Doppler and Color Doppler (Both Spectral and            Color Flow Doppler were utilized during procedure). Indications:    CHF-Acute Diastolic I50.31  History:        Patient has prior history of Echocardiogram examinations, most                 recent 08/18/2023. CHF, CAD, COPD; Risk Factors:Hypertension.  Sonographer:    Darlys Gales Referring Phys: [id number] ERIK C HOFFMAN IMPRESSIONS  1. Left ventricular ejection fraction, by estimation, is 55 to 60%. The left ventricle has normal function. The left ventricle has no regional wall motion abnormalities. There is the interventricular septum is flattened in diastole ('D' shaped left ventricle), consistent with right ventricular volume overload.  2. Right ventricular systolic function is severely reduced. The right ventricular size is severely enlarged.  3. The mitral valve has been repaired/replaced. The mean mitral valve gradient is 3.0 mmHg with average heart rate of 56 bpm.  4. There is severe annular dilation and tricuspid leaflet  malcoaptation. The tricuspid valve is abnormal. Tricuspid valve regurgitation is severe.  5. The aortic valve is tricuspid. Comparison(s): No significant change from prior study. Prior images reviewed side by side. FINDINGS  Left Ventricle: Left ventricular ejection fraction, by estimation, is 55 to 60%. The left ventricle has normal function. The left ventricle has no regional wall motion abnormalities. The left ventricular internal cavity size was normal in size. There is  no left ventricular hypertrophy. The interventricular septum is flattened in diastole ('D' shaped left ventricle), consistent with right ventricular volume overload. Right Ventricle: The right ventricular size is severely enlarged. Right ventricular systolic function is severely reduced. Mitral Valve: The mitral valve has been repaired/replaced. There is a mechanical valve present in the mitral position. MV peak gradient, 8.5 mmHg. The mean mitral valve gradient is 3.0 mmHg with average heart rate of 56 bpm. Tricuspid Valve: There is severe annular dilation and tricuspid leaflet malcoaptation. The tricuspid valve is abnormal. Tricuspid valve regurgitation is severe. Aortic Valve: The aortic valve is tricuspid. Pulmonic Valve: The pulmonic valve was grossly normal. Aorta: The aortic root is normal in size and structure. Additional Comments: Spectral Doppler performed. Color Doppler performed.  LEFT VENTRICLE PLAX 2D LVIDd:         4.30 cm LVIDs:         3.10 cm LV PW:         0.70 cm LV IVS:        1.10 cm  RIGHT VENTRICLE RV Basal diam:  6.70 cm RV Mid diam:    5.90 cm MITRAL VALVE  TRICUSPID VALVE MV Area (PHT): 2.17 cm   TR Peak grad:   7.3 mmHg MV Peak grad:  8.5 mmHg   TR Vmax:        135.00 cm/s MV Mean grad:  3.0 mmHg MV Vmax:       1.46 m/s MV Vmean:      74.3 cm/s MV Decel Time: 349 msec Mihai Croitoru MD Electronically signed by Thurmon Fair MD Signature Date/Time: 09/20/2023/4:07:02 PM    Final    DG Chest 2  View Result Date: 09/19/2023 CLINICAL DATA:  Shortness of breath. EXAM: CHEST - 2 VIEW COMPARISON:  August 27, 2023. FINDINGS: Stable cardiomegaly. Status post cardiac valve repair. Right lung is clear. New left basilar opacity is noted concerning for atelectasis or infiltrate with possible small pleural effusion. Bony thorax is unremarkable. IMPRESSION: New left basilar opacity as noted above. Electronically Signed   By: Lupita Raider M.D.   On: 09/19/2023 19:19     Discharge Instructions: Dear Kim Morello,   It was a pleasure taking care of you at [facility]. You were admitted for shortness of breath and lower extremity edema and treated for acute on chronic heart failure. We are discharging you home now that you are doing better. Please follow the following instructions.  1) I am increasing your torsemide to 120 mg daily 2) Please be sure to follow-up at the Coumadin clinic on Monday 09/29/23 for repeat INR  3) Please reduce your salt intake   Take care,  Dr. Kathleen Lime, MD     Signed: Kathleen Lime, MD [facility] - PGY1 Pager: [phone] 09/22/2023, 10:34 AM    Please contact the on call pager after 5 pm and on weekends at [phone].

## 2023-09-22 NOTE — Discharge Instructions (Addendum)
 Dear Ms Jalbert,   It was a pleasure taking care of you at [facility]. You were admitted for shortness of breath and lower extremity edema and treated for acute on chronic heart failure. We are discharging you home now that you are doing better. Please follow the following instructions.  1) I am increasing your torsemide to 120 mg daily 2) Please be sure to follow-up at the Coumadin clinic on Monday 09/29/23 for repeat INR  3) Please reduce your salt intake   Take care,  Dr. Kathleen Lime, MD

## 2023-09-22 NOTE — Care Management Important Message (Signed)
 Important Message  Patient Details  Name: Kim Anthony [name]: [id number] Date of Birth: 30-Nov-1950   Important Message Given:  Yes - Medicare IM     Renie Ora 09/22/2023, 10:32 AM

## 2023-09-22 NOTE — Progress Notes (Addendum)
 PHARMACY - ANTICOAGULATION CONSULT NOTE  Pharmacy Consult for PTA warfarin Indication: mechanical MVR, afib  Allergies  Allergen Reactions   Lisinopril Swelling   Acetaminophen-Codeine Itching   Propoxyphene Itching    Darvocet   Tape Rash    Plastic   Tramadol Itching, Nausea And Vomiting and Rash    Patient Measurements: Height: 5\' 5"  (165.1 cm) Weight: 72.6 kg (160 lb 0.9 oz) IBW/kg (Calculated) : 57 HEPARIN DW (KG): 71.6  Vital Signs: Temp: 98.2 F (36.8 C) (04/02 0447) Temp Source: Oral (04/02 0447) BP: 111/78 (04/02 0447) Pulse Rate: 66 (04/02 0010)  Labs: Recent Labs    09/19/23 2152 09/20/23 0116 09/20/23 0201 09/20/23 0409 09/21/23 0453 09/22/23 0334  HGB 9.6*  --   --  9.3* 9.8* 10.2*  HCT 32.9*  --   --  32.0* 33.3* 34.2*  PLT 322  --   --  337 323 332  LABPROT  --   --   --  61.7* 49.5* 36.3*  INR  --   --   --  7.2* 5.4* 3.6*  CREATININE 2.67*  --  2.70*  --  2.64*  --   TROPONINIHS 13 265* 14 12  --   --     Estimated Creatinine Clearance: 18.9 mL/min (A) (by C-G formula based on SCr of 2.64 mg/dL (H)).   Medical History: Past Medical History:  Diagnosis Date   Acute delirium 08/30/2023   Acute GI bleeding 08/18/2023   Arthritis    Asthma    Breast cancer (HCC) 2001   Left Breast Cancer   CHF (congestive heart failure) (HCC)    COPD (chronic obstructive pulmonary disease) (HCC)    Coronary artery disease    GERD (gastroesophageal reflux disease)    History of mitral valve replacement with mechanical valve    HLD (hyperlipidemia)    Hypertension    Pulmonary embolism (HCC) 2021    Assessment: 36 yoF presented to ED with worsening shortness of breath and leg swelling found to have HF exacerbation. PMH includes recent hospitalization for GIB > hemorrhagic/cardiogenic shock requiring ICU stay and discharged on 3/19 with INR 2.5. At clinic, INR on 3/25 was 5.4 and patient instructed to hold 1 dose then restart at 5mg  alternating with  2.5mg  every other day. Admit INR 7.2, no active bleeding. Pharmacy consulted to manage warfarin.  INR down to 3.6 after holding x2 days. CBC stable. Eating 950-100% of meals. Restart warfarin.   Goal of Therapy:  INR 2.5-3.5 Monitor platelets by anticoagulation protocol: Yes   Plan:  Warfarin 2mg  x1  Monitor daily INR, CBC, signs/symptoms of bleeding  For discharge, recommend 2.5mg  daily with INR check by Friday 4/4. If unable to get INR on Friday, then recommend 2.5mg  daily except 5mg  on Saturday with INR check on Monday 4/7.   Alphia Moh, PharmD, BCPS, BCCP Clinical Pharmacist  Please check AMION for all [facility] Pharmacy phone numbers After 10:00 PM, call Main Pharmacy [phone]

## 2023-09-22 NOTE — TOC Transition Note (Signed)
 Transition of Care (TOC) - Discharge Note Donn Pierini RN, BSN Transitions of Care Unit 4E- RN Case Manager See Treatment Team for direct phone #   Patient Details  Name: Kim Anthony [name]: [id number] Date of Birth: [birth year]  Transition of Care [facility]) CM/SW Contact:  Darrold Span, RN Phone Number: 09/22/2023, 2:21 PM   Clinical Narrative:    Pt stable for transition home today, Orders placed for HHRN/PT/OT,  Pt active with [facility] services- liaison notified for resumption of care.   Pt also has needed DME at home, including home 02 w/ [employer].  Daughter to transport home and will bring portable 02 for transport.   No further TOC intervention needs noted.    Final next level of care: Home w Home Health Services Barriers to Discharge: No Barriers Identified   Patient Goals and CMS Choice Patient states their goals for this hospitalization and ongoing recovery are:: return home CMS Medicare.gov Compare Post Acute Care list provided to:: Patient Represenative (must comment) Choice offered to / list presented to : Adult Children      Discharge Placement               Home w/ [facility]        Discharge Plan and Services Additional resources added to the After Visit Summary for   In-house Referral: NA Discharge Planning Services: CM Consult Post Acute Care Choice: Home Health, Resumption of Svcs/PTA Provider          DME Arranged: N/A DME Agency: NA       HH Arranged: RN, PT, OT HH Agency: [facility] Date HH Agency Contacted: 09/22/23 Time HH Agency Contacted: 1300 Representative spoke with at [facility] Agency: Brandi  Social Drivers of Health (SDOH) Interventions SDOH Screenings   Food Insecurity: No Food Insecurity (09/20/2023)  Housing: Low Risk  (09/20/2023)  Transportation Needs: No Transportation Needs (09/20/2023)  Utilities: Not At Risk (09/20/2023)  Alcohol Screen: Low Risk  (12/01/2022)  Depression (PHQ2-9): Low Risk  (12/01/2022)   Financial Resource Strain: Low Risk  (12/01/2022)  Physical Activity: Inactive (12/01/2022)  Social Connections: Patient Declined (09/20/2023)  Stress: Stress Concern Present (12/01/2022)  Tobacco Use: High Risk (09/19/2023)     Readmission Risk Interventions    09/22/2023    2:20 PM 02/19/2022    3:36 PM  Readmission Risk Prevention Plan  Transportation Screening Complete Complete  PCP or Specialist Appt within 3-5 Days  Complete  HRI or Home Care Consult  Complete  Social Work Consult for Recovery Care Planning/Counseling  Complete  Palliative Care Screening  Not Applicable  Medication Review Oceanographer) Complete Complete  HRI or Home Care Consult Complete   Palliative Care Screening Not Applicable   Skilled Nursing Facility Not Applicable

## 2023-09-22 NOTE — Evaluation (Signed)
 Physical Therapy Evaluation Patient Details Name: Kim Anthony [name]: [id number] DOB: Jun 16, 1951 Today's Date: 09/22/2023  History of Present Illness  Pt is 73 yo female presenting 09/19/23 with 3 day history of SOB, orthopnea, and a non-productive cough. Admitted with systolic and diastolic CHF. Of note, pt was recently admitted 2/25-3/19/25 for a GI bleed and cardiogenic shock. PMH: gout, CAD, HFrEF, a fib, HTN, GERD, HLD, COPD, hx of breast cancer s/p L mastectomy, AKI, and radiation/chemo.  Clinical Impression  Pt is a 73 y.o. female presenting on 09/19/23 with above conditions and deficits below, see PT Problem List. PTA pt lived in an one level home with her children, used a RW or SPC for ambulation, and had 8 STE. Currently the pt is supervision for bed mobility, CGA for transfers, and min A for ambulation with RW. Pt displays deficits in general weakness, activity tolerance, cognition, awareness, safety/judgement, functional mobility, balance, and gait and would benefit from continued acute PT. Given the pt's current cognition, current condition, potential family assistance and living situation, pt would benefit from intense rehab < 3 hours upon d/c. Pt would benefit from further rehab to promote return to mod I level of function.  If the pt's cognition improves and has the available family support, potential other rehab options such as HHPT should be explored. Pt would benefit from continued mobility with nurses and mobility specialists until d/c. Will continue to follow acutely.         If plan is discharge home, recommend the following: Assistance with cooking/housework;Assist for transportation;Help with stairs or ramp for entrance;A little help with walking and/or transfers;A little help with bathing/dressing/bathroom   Can travel by private vehicle   Yes    Equipment Recommendations None recommended by PT  Recommendations for Other Services       Functional Status Assessment Patient has  had a recent decline in their functional status and demonstrates the ability to make significant improvements in function in a reasonable and predictable amount of time.     Precautions / Restrictions        Mobility  Bed Mobility Overal bed mobility: Needs Assistance Bed Mobility: Supine to Sit     Supine to sit: Supervision, HOB elevated, Used rails     General bed mobility comments: pt uses rails, HOB elevation, and increased time to get to EOB. supervision for safety. Needs cueing to square hips with EOB and assist to put on socks    Transfers Overall transfer level: Needs assistance Equipment used: Rolling walker (2 wheels) Transfers: Sit to/from Stand Sit to Stand: Contact guard assist           General transfer comment: pt stands with increased trunk flexion and requires extra time to fully stand. CGA for safety. stands from lowest bed height    Ambulation/Gait Ambulation/Gait assistance: Min assist Gait Distance (Feet): 180 Feet Assistive device: Rolling walker (2 wheels) Gait Pattern/deviations: Step-through pattern, Decreased step length - right, Decreased step length - left, Shuffle, Trunk flexed Gait velocity: decr Gait velocity interpretation: 1.31 - 2.62 ft/sec, indicative of limited community ambulator   General Gait Details: requires cues for maintaining closer proximity to RW, limiting trunk flexion, and reducing initial gait speed. As she fatigued, pt reported increased SOB and increased her trunk flexion.  Stairs            Wheelchair Mobility     Tilt Bed    Modified Rankin (Stroke Patients Only)       Balance Overall  balance assessment: Needs assistance Sitting-balance support: Feet supported, No upper extremity supported Sitting balance-Leahy Scale: Good Sitting balance - Comments: pt able to sit EOB and semi-long sit to put her R sock on halfway, unable to reach and put on L sock in the similar fashion. No LOB.   Standing balance  support: Bilateral upper extremity supported, During functional activity Standing balance-Leahy Scale: Poor Standing balance comment: pt stands with UEs on RW and increased trunk flexion.                             Pertinent Vitals/Pain Pain Assessment Pain Assessment: No/denies pain Pain Intervention(s): Monitored during session    Home Living Family/patient expects to be discharged to:: Private residence Living Arrangements: Children Available Help at Discharge: Family;Available PRN/intermittently Type of Home: House Home Access: Stairs to enter Entrance Stairs-Rails: Right;Left;Can reach both Entrance Stairs-Number of Steps: 8   Home Layout: One level Home Equipment: Rollator (4 wheels);Rolling Walker (2 wheels);Cane - single point;BSC/3in1;Shower seat;Grab bars - tub/shower Additional Comments: Pt reports no changes in her home living situation since her last admission. Although she does claim that she lives with her nephew, daughter, and another family member and changes who she lives with perdiocally. Daughter works full time    Prior Function Prior Level of Function : Needs assist       Physical Assist : Mobility (physical);ADLs (physical) Mobility (physical): Gait;Stairs ADLs (physical): IADLs Mobility Comments: Mod I with RW or SPC ADLs Comments: daughter manages medications, admits she forgets to take her medications 2x/week     Extremity/Trunk Assessment             Cervical / Trunk Assessment Cervical / Trunk Assessment: Kyphotic  Communication   Communication Communication: Impaired Factors Affecting Communication: Reduced clarity of speech    Cognition Arousal: Alert Behavior During Therapy: WFL for tasks assessed/performed   PT - Cognitive impairments: Difficult to assess, Orientation, Awareness, Memory, Attention, Safety/Judgement Difficult to assess due to: Impaired communication Orientation impairments: Place, Situation, Time                    PT - Cognition Comments: pt required cueing to answer time, place, and situation orientation questions, was slightly impulsive and required consistent safety and attention cueing. Pt reports she's currently in a rental property to later state that she's been in the hospital for 2 weeks. Following commands: Impaired Following commands impaired: Follows one step commands inconsistently, Follows one step commands with increased time     Cueing Cueing Techniques: Verbal cues, Tactile cues     General Comments      Exercises     Assessment/Plan    PT Assessment Patient needs continued PT services  PT Problem List Decreased strength;Decreased range of motion;Decreased activity tolerance;Decreased balance;Decreased mobility;Decreased coordination;Decreased cognition;Decreased knowledge of use of DME;Decreased safety awareness;Decreased knowledge of precautions;Cardiopulmonary status limiting activity       PT Treatment Interventions DME instruction;Gait training;Stair training;Functional mobility training;Therapeutic activities;Therapeutic exercise;Balance training;Neuromuscular re-education;Cognitive remediation;Patient/family education    PT Goals (Current goals can be found in the Care Plan section)  Acute Rehab PT Goals Patient Stated Goal: improve her breathing PT Goal Formulation: With patient Time For Goal Achievement: 10/06/23 Potential to Achieve Goals: Good    Frequency Min 2X/week     Co-evaluation               AM-PAC PT "6 Clicks" Mobility  Outcome Measure Help needed turning  from your back to your side while in a flat bed without using bedrails?: A Little Help needed moving from lying on your back to sitting on the side of a flat bed without using bedrails?: A Little Help needed moving to and from a bed to a chair (including a wheelchair)?: A Little Help needed standing up from a chair using your arms (e.g., wheelchair or bedside chair)?: A  Little Help needed to walk in hospital room?: A Little Help needed climbing 3-5 steps with a railing? : A Little 6 Click Score: 18    End of Session Equipment Utilized During Treatment: Gait belt;Oxygen Activity Tolerance: Patient limited by fatigue Patient left: in chair;with call bell/phone within reach;with chair alarm set Nurse Communication: Mobility status;Other (comment) (pt's cognitive status) PT Visit Diagnosis: Unsteadiness on feet (R26.81);Other abnormalities of gait and mobility (R26.89);Muscle weakness (generalized) (M62.81)    Time: 1610-9604 PT Time Calculation (min) (ACUTE ONLY): 23 min   Charges:   PT Evaluation $PT Eval Moderate Complexity: 1 Mod PT Treatments $Gait Training: 8-22 mins PT General Charges $$ ACUTE PT VISIT: 1 Visit         [address], SPT   [place] 09/22/2023, 1:07 PM

## 2023-09-23 ENCOUNTER — Telehealth: Payer: Self-pay

## 2023-09-23 ENCOUNTER — Other Ambulatory Visit: Payer: Self-pay

## 2023-09-23 NOTE — Patient Outreach (Signed)
 Care Management  Transitions of Care Program Transitions of Care Post-discharge Closure Note - Patient readmitted  09/23/2023 Name: Kim Anthony [name]: [id number] DOB: [birth year]  Subjective: Kim Anthony is a 73 y.o. year old female who is a primary care patient of Kathleen Lime, MD. Per health record, patient admitted 09/19/23  Plan: [facility] RN will reach out post discharge   Goals Addressed             This Visit's Progress    TOC Care Plan       Current Barriers: Patient readmitted 09/19/23 Goals Not Met Knowledge Deficits related to plan of care for management of CHF - 09/16/23 Educated provided on going TOC Follow up calls needed   RNCM Clinical Goal(s): Patient readmitted 09/19/23 Goals Not Met Patient will work with the Care Management team over the next 30 days to address Transition of Care Barriers: Medication Management take all medications exactly as prescribed and will call provider for medication related questions as evidenced by Electronic Medical Record attend all scheduled medical appointments: PCP and Specialist as evidenced by Electronic Medical Records through collaboration with RN Care manager, provider, and care team.   Interventions:Patient readmitted 09/19/23 Goals Not Met Evaluation of current treatment plan related to  self management and patient's adherence to plan as established by provider   Heart Failure Interventions:  (Status:  Goal not met.) Short Term Goal Patient readmitted 09/19/23 Goals Not Met Assessed need for readable accurate scales in home Provided education about placing scale on hard, flat surface Advised patient to weigh each morning after emptying bladder Discussed importance of daily weight and advised patient to weigh and record daily Reviewed role of diuretics in prevention of fluid overload and management of heart failure; Discussed the importance of keeping all appointments with provider  Patient Goals/Self-Care Activities:Patient  readmitted 09/19/23 Goals Not Met Participate in Transition of Care Program/Attend [facility] scheduled calls Notify RN Care Manager of [facility] call rescheduling needs Take all medications as prescribed Attend all scheduled provider appointments Call pharmacy for medication refills 3-7 days in advance of running out of medications Perform all self care activities independently  Call provider office for new concerns or questions  call office if I gain more than 2 pounds in one day or 5 pounds in one week track weight in diary watch for swelling in feet, ankles and legs every day weigh myself daily track symptoms and what helps feel better or worse  Follow Up Plan: Patient readmitted 09/19/23 Goals Not Met The patient has been provided with contact information for the care management team and has been advised to call with any health related questions or concerns.           Hilbert Odor RN, CCM [place]  VBCI-Population Health RN Care Manager [phone]

## 2023-09-23 NOTE — Transitions of Care (Post Inpatient/ED Visit) (Signed)
 09/23/2023  Name: Kim Anthony [name]: [id number] DOB: 03/29/1951  Today's TOC FU Call Status: Today's TOC FU Call Status:: Successful TOC FU Call Completed TOC FU Call Complete Date: 09/23/23 Patient's Name and Date of Birth confirmed.  Transition Care Management Follow-up Telephone Call Date of Discharge: 09/22/23 Discharge Facility: [facility]) Type of Discharge: Inpatient Admission Primary Inpatient Discharge Diagnosis:: Acute combined systolic and diastolic heart failure How have you been since you were released from the hospital?: Better Any questions or concerns?: No (patient denies)  Items Reviewed: Did you receive and understand the discharge instructions provided?: Yes (Patient states her daughter, Kim Anthony has the d/c papers) Any new allergies since your discharge?: No Dietary orders reviewed?: Yes Do you have support at home?: Yes (daughter, Kim Anthony) People in Home: child(ren), adult Name of Support/Comfort Primary Source: Daughter, Kim Anthony  Medications Reviewed Today: Medications Reviewed Today   Medications were not reviewed in this encounter     Home Care and Equipment/Supplies: Were Home Health Services Ordered?: Yes Name of Home Health Agency:: [facility] Has Agency set up a time to come to your home?: Yes First Home Health Visit Date: 09/24/23 [facility] RN called [facility] - [phone]) Any new equipment or medical supplies ordered?: No Name of Medical supply agency?: Patient states she is using O2 2liters as as needed Were you able to get the equipment/medical supplies?: Yes Do you have any questions related to the use of the equipment/supplies?: No  Functional Questionnaire: Do you need assistance with bathing/showering or dressing?: Yes (daughter helps) Do you need assistance with meal preparation?: No Do you need assistance with eating?: No Do you have difficulty maintaining continence: No Do you need assistance with getting out of bed/getting out of a  chair/moving?: No Do you have difficulty managing or taking your medications?: Yes (daughter helps)  Follow up appointments reviewed: PCP Follow-up appointment confirmed?: Yes Date of PCP follow-up appointment?: 09/29/23 Follow-up Provider: hospital follow up Specialist Hospital Follow-up appointment confirmed?: NA Do you need transportation to your follow-up appointment?: No Do you understand care options if your condition(s) worsen?: Yes-patient verbalized understanding  SDOH Interventions Today    Flowsheet Row Most Recent Value  SDOH Interventions   Food Insecurity Interventions Intervention Not Indicated  Housing Interventions Intervention Not Indicated  Transportation Interventions Intervention Not Indicated  Utilities Interventions Intervention Not Indicated       Goals Addressed             This Visit's Progress    TOC Care Plan - Patient will report no readmissions in the next 30 days       Current Barriers:  Medication management TOC RN unable to review medications - attempted but patient stated incorrect dose of Torsemide and then said her daughter manages her medications and is not at home  Chronic Disease Management support and education needs related to CHF   RNCM Clinical Goal(s):  Patient will work with the Care Management team over the next 30 days to address Transition of Care Barriers: Medication Management Patient will verbalize understanding of plan for management of CHF as evidenced by patient reporting weighing self daily and calling with weight gain of 2lbs overnight or 5lbs in a week Patient will take all medications exactly as prescribed and will call provider for medication related questions as evidenced by patient/daughter report Patient will attend all scheduled medical appointments:   Interventions: Evaluation of current treatment plan related to  self management and patient's adherence to plan as established by provider  TOC RN placed call to  [facility] and was advised SN will see patient tomorrow 09/24/23 - TOC RN requested SN to a very thorough medication review because patient reported incorrect dose of Torsemide TOC RN reviewed oxygen with patient and educated on oxygen safety  Transitions of Care:  New goal. Doctor Visits  - discussed the importance of doctor visits: [facility] RN discussed importance of PCP follow up and patient states daughter will schedule - TOC RN discussed importance of follow up with Dr Alexandria Lodge related to INR on presentation to hospital per record was 7.2.  Her warfarin was held until her INR went down to 3.6 at discharge.Her warfarin was resumed at 2.5 mg   Heart Failure Interventions:  (Status:  New goal.) Short Term Goal Basic overview and discussion of pathophysiology of Heart Failure reviewed Assessed need for readable accurate scales in home Provided education about placing scale on hard, flat surface Advised patient to weigh each morning after emptying bladder Discussed importance of daily weight and advised patient to weigh and record daily  Patient Goals/Self-Care Activities: Participate in Transition of Care Program/Attend [facility] scheduled calls Notify RN Care Manager of TOC call rescheduling needs Take all medications as prescribed Attend all scheduled provider appointments  Follow Up Plan:  Telephone follow up appointment with care management team member scheduled for:  09/30/23 2pm The patient has been provided with contact information for the care management team and has been advised to call with any health related questions or concerns.          Hilbert Odor RN, CCM [place]  VBCI-Population Health RN Care Manager [phone]

## 2023-09-24 DIAGNOSIS — Z79899 Other long term (current) drug therapy: Secondary | ICD-10-CM | POA: Diagnosis not present

## 2023-09-24 DIAGNOSIS — F17210 Nicotine dependence, cigarettes, uncomplicated: Secondary | ICD-10-CM | POA: Diagnosis not present

## 2023-09-24 DIAGNOSIS — Z7984 Long term (current) use of oral hypoglycemic drugs: Secondary | ICD-10-CM | POA: Diagnosis not present

## 2023-09-24 DIAGNOSIS — M1712 Unilateral primary osteoarthritis, left knee: Secondary | ICD-10-CM | POA: Diagnosis not present

## 2023-09-24 DIAGNOSIS — E785 Hyperlipidemia, unspecified: Secondary | ICD-10-CM | POA: Diagnosis not present

## 2023-09-24 DIAGNOSIS — I130 Hypertensive heart and chronic kidney disease with heart failure and stage 1 through stage 4 chronic kidney disease, or unspecified chronic kidney disease: Secondary | ICD-10-CM | POA: Diagnosis not present

## 2023-09-24 DIAGNOSIS — M25561 Pain in right knee: Secondary | ICD-10-CM | POA: Diagnosis not present

## 2023-09-24 DIAGNOSIS — D539 Nutritional anemia, unspecified: Secondary | ICD-10-CM | POA: Diagnosis not present

## 2023-09-24 DIAGNOSIS — I480 Paroxysmal atrial fibrillation: Secondary | ICD-10-CM | POA: Diagnosis not present

## 2023-09-24 DIAGNOSIS — I5020 Unspecified systolic (congestive) heart failure: Secondary | ICD-10-CM | POA: Diagnosis not present

## 2023-09-24 DIAGNOSIS — Z7952 Long term (current) use of systemic steroids: Secondary | ICD-10-CM | POA: Diagnosis not present

## 2023-09-24 DIAGNOSIS — I2510 Atherosclerotic heart disease of native coronary artery without angina pectoris: Secondary | ICD-10-CM | POA: Diagnosis not present

## 2023-09-24 DIAGNOSIS — G8929 Other chronic pain: Secondary | ICD-10-CM | POA: Diagnosis not present

## 2023-09-24 DIAGNOSIS — Z7951 Long term (current) use of inhaled steroids: Secondary | ICD-10-CM | POA: Diagnosis not present

## 2023-09-24 DIAGNOSIS — Z7901 Long term (current) use of anticoagulants: Secondary | ICD-10-CM | POA: Diagnosis not present

## 2023-09-24 DIAGNOSIS — K921 Melena: Secondary | ICD-10-CM | POA: Diagnosis not present

## 2023-09-24 DIAGNOSIS — N179 Acute kidney failure, unspecified: Secondary | ICD-10-CM | POA: Diagnosis not present

## 2023-09-24 DIAGNOSIS — K219 Gastro-esophageal reflux disease without esophagitis: Secondary | ICD-10-CM | POA: Diagnosis not present

## 2023-09-24 DIAGNOSIS — K922 Gastrointestinal hemorrhage, unspecified: Secondary | ICD-10-CM | POA: Diagnosis not present

## 2023-09-24 DIAGNOSIS — N1831 Chronic kidney disease, stage 3a: Secondary | ICD-10-CM | POA: Diagnosis not present

## 2023-09-24 DIAGNOSIS — Z952 Presence of prosthetic heart valve: Secondary | ICD-10-CM | POA: Diagnosis not present

## 2023-09-24 DIAGNOSIS — I712 Thoracic aortic aneurysm, without rupture, unspecified: Secondary | ICD-10-CM | POA: Diagnosis not present

## 2023-09-24 DIAGNOSIS — J4489 Other specified chronic obstructive pulmonary disease: Secondary | ICD-10-CM | POA: Diagnosis not present

## 2023-09-24 DIAGNOSIS — Z9981 Dependence on supplemental oxygen: Secondary | ICD-10-CM | POA: Diagnosis not present

## 2023-09-27 ENCOUNTER — Telehealth: Payer: Self-pay | Admitting: Pharmacist

## 2023-09-27 ENCOUNTER — Telehealth: Payer: Self-pay | Admitting: *Deleted

## 2023-09-27 NOTE — Telephone Encounter (Signed)
 Texted patient self-testing results by the patient's daughter, [name], reporting her mom's INR is 1.60 on 1/2 x 5 mg strength warfarin tablet, all days of the week EXCEPT on [facility], she has been taking one (1) 5mg  strength warfarin tablet on Thursdays. Was instructed to INCREASE dose to 5mg  on Tuesdays/Thursdays; take 1/2 x 5 mg [2.5 mg] dose all other days. Repeat PST FS POC INR on Monday 14-APR-25 and report value to me. There was a 2.5 mg dose MISSED on SUNDAY 6-APR-25. No bleeding reported, no new medications.

## 2023-09-27 NOTE — Telephone Encounter (Signed)
 Copied from CRM [id number]. Topic: Clinical - Home Health Verbal Orders >> Sep 27, 2023 10:30 AM Dennison Nancy wrote: Caller/Agency: Caron Presume - [facility]  Callback Number: [phone] Service Requested: like to follow care for congestive heart failure  Frequency: 1 week 4 , 2 month 1 , and 2 PRN as needed , saw patient ROC for homecare on friday Any new concerns about the patient? Yes  Patient has a lot of needs , need to eat right , and take medication

## 2023-09-27 NOTE — Telephone Encounter (Signed)
 RTC to Kim Anthony.  Will RTC later.

## 2023-09-27 NOTE — Telephone Encounter (Signed)
 RTC to Rinaldo Cloud, RN with [facility]  [facility].  States patient is in CHF.  Continues to smoke and drinks beer.  Swelling was noted in her legs and feet. Swelling is worse on left side  Patient has to sleep on 3 pillows/Sitting up at night.  Has COPD.  Is not eating correctly.  Using salt which is restricted and eating processed meats.  Has been taken care of by her daughter who has children and patient has to go with daughter at times to do things with the children.  Question as to weather patient is taking her medications as prescribed.  Rinaldo Cloud is asking for Skilled Nursing visits 1 time a week for 4 weeks.  2 times a month for 1 month and then 2 prn visits.  Approval was given for visits .  Will work on medication compliance and CHF disease process.  Patient has already been instructed to daily weights and to notify Nurse of increases.

## 2023-09-28 ENCOUNTER — Telehealth (HOSPITAL_COMMUNITY): Payer: Self-pay

## 2023-09-28 NOTE — Telephone Encounter (Signed)
 Called to confirm/remind patient of their [facility] on [facility].   Appointment:   [x] Confirmed  [] Left mess   [] No answer/No voice mail  [] Phone not in service  Patient reminded to bring all medications and/or complete list.  Confirmed patient has transportation. Gave directions, instructed to utilize valet parking.

## 2023-09-29 ENCOUNTER — Encounter: Admitting: Student

## 2023-09-29 ENCOUNTER — Telehealth: Payer: Self-pay | Admitting: *Deleted

## 2023-09-29 ENCOUNTER — Emergency Department (HOSPITAL_COMMUNITY)

## 2023-09-29 ENCOUNTER — Other Ambulatory Visit: Payer: Self-pay

## 2023-09-29 ENCOUNTER — Encounter (HOSPITAL_COMMUNITY)

## 2023-09-29 ENCOUNTER — Encounter (HOSPITAL_COMMUNITY): Payer: Self-pay | Admitting: *Deleted

## 2023-09-29 ENCOUNTER — Inpatient Hospital Stay (HOSPITAL_COMMUNITY)
Admission: EM | Admit: 2023-09-29 | Discharge: 2023-10-08 | DRG: 291 | Disposition: A | Discharge status: Home Health | Attending: Student in an Organized Health Care Education/Training Program | Admitting: Student in an Organized Health Care Education/Training Program

## 2023-09-29 DIAGNOSIS — R0989 Other specified symptoms and signs involving the circulatory and respiratory systems: Secondary | ICD-10-CM | POA: Diagnosis not present

## 2023-09-29 DIAGNOSIS — I2729 Other secondary pulmonary hypertension: Secondary | ICD-10-CM | POA: Diagnosis present

## 2023-09-29 DIAGNOSIS — I4892 Unspecified atrial flutter: Secondary | ICD-10-CM | POA: Diagnosis present

## 2023-09-29 DIAGNOSIS — Z888 Allergy status to other drugs, medicaments and biological substances status: Secondary | ICD-10-CM

## 2023-09-29 DIAGNOSIS — K922 Gastrointestinal hemorrhage, unspecified: Secondary | ICD-10-CM | POA: Diagnosis not present

## 2023-09-29 DIAGNOSIS — R251 Tremor, unspecified: Secondary | ICD-10-CM | POA: Diagnosis not present

## 2023-09-29 DIAGNOSIS — R0602 Shortness of breath: Secondary | ICD-10-CM | POA: Diagnosis not present

## 2023-09-29 DIAGNOSIS — Z952 Presence of prosthetic heart valve: Secondary | ICD-10-CM

## 2023-09-29 DIAGNOSIS — I3489 Other nonrheumatic mitral valve disorders: Secondary | ICD-10-CM | POA: Diagnosis not present

## 2023-09-29 DIAGNOSIS — K219 Gastro-esophageal reflux disease without esophagitis: Secondary | ICD-10-CM | POA: Diagnosis present

## 2023-09-29 DIAGNOSIS — Z9012 Acquired absence of left breast and nipple: Secondary | ICD-10-CM

## 2023-09-29 DIAGNOSIS — N1831 Chronic kidney disease, stage 3a: Secondary | ICD-10-CM | POA: Diagnosis not present

## 2023-09-29 DIAGNOSIS — E876 Hypokalemia: Secondary | ICD-10-CM | POA: Diagnosis present

## 2023-09-29 DIAGNOSIS — M25561 Pain in right knee: Secondary | ICD-10-CM | POA: Diagnosis not present

## 2023-09-29 DIAGNOSIS — I50810 Right heart failure, unspecified: Secondary | ICD-10-CM | POA: Diagnosis not present

## 2023-09-29 DIAGNOSIS — Z853 Personal history of malignant neoplasm of breast: Secondary | ICD-10-CM

## 2023-09-29 DIAGNOSIS — F17210 Nicotine dependence, cigarettes, uncomplicated: Secondary | ICD-10-CM | POA: Diagnosis present

## 2023-09-29 DIAGNOSIS — M1712 Unilateral primary osteoarthritis, left knee: Secondary | ICD-10-CM | POA: Diagnosis not present

## 2023-09-29 DIAGNOSIS — I5082 Biventricular heart failure: Secondary | ICD-10-CM | POA: Diagnosis not present

## 2023-09-29 DIAGNOSIS — Z803 Family history of malignant neoplasm of breast: Secondary | ICD-10-CM

## 2023-09-29 DIAGNOSIS — I4891 Unspecified atrial fibrillation: Secondary | ICD-10-CM | POA: Diagnosis not present

## 2023-09-29 DIAGNOSIS — I5033 Acute on chronic diastolic (congestive) heart failure: Secondary | ICD-10-CM | POA: Diagnosis present

## 2023-09-29 DIAGNOSIS — R791 Abnormal coagulation profile: Secondary | ICD-10-CM | POA: Diagnosis present

## 2023-09-29 DIAGNOSIS — I517 Cardiomegaly: Secondary | ICD-10-CM | POA: Diagnosis not present

## 2023-09-29 DIAGNOSIS — I130 Hypertensive heart and chronic kidney disease with heart failure and stage 1 through stage 4 chronic kidney disease, or unspecified chronic kidney disease: Secondary | ICD-10-CM | POA: Diagnosis not present

## 2023-09-29 DIAGNOSIS — I509 Heart failure, unspecified: Principal | ICD-10-CM

## 2023-09-29 DIAGNOSIS — Z7901 Long term (current) use of anticoagulants: Secondary | ICD-10-CM | POA: Diagnosis not present

## 2023-09-29 DIAGNOSIS — K5903 Drug induced constipation: Secondary | ICD-10-CM | POA: Diagnosis present

## 2023-09-29 DIAGNOSIS — Z7984 Long term (current) use of oral hypoglycemic drugs: Secondary | ICD-10-CM | POA: Diagnosis not present

## 2023-09-29 DIAGNOSIS — I5020 Unspecified systolic (congestive) heart failure: Secondary | ICD-10-CM | POA: Diagnosis not present

## 2023-09-29 DIAGNOSIS — H5713 Ocular pain, bilateral: Secondary | ICD-10-CM | POA: Insufficient documentation

## 2023-09-29 DIAGNOSIS — E860 Dehydration: Secondary | ICD-10-CM | POA: Diagnosis not present

## 2023-09-29 DIAGNOSIS — E785 Hyperlipidemia, unspecified: Secondary | ICD-10-CM | POA: Diagnosis not present

## 2023-09-29 DIAGNOSIS — Z8249 Family history of ischemic heart disease and other diseases of the circulatory system: Secondary | ICD-10-CM

## 2023-09-29 DIAGNOSIS — I2781 Cor pulmonale (chronic): Secondary | ICD-10-CM | POA: Diagnosis not present

## 2023-09-29 DIAGNOSIS — Z86711 Personal history of pulmonary embolism: Secondary | ICD-10-CM

## 2023-09-29 DIAGNOSIS — R600 Localized edema: Secondary | ICD-10-CM | POA: Diagnosis not present

## 2023-09-29 DIAGNOSIS — I82612 Acute embolism and thrombosis of superficial veins of left upper extremity: Secondary | ICD-10-CM | POA: Diagnosis present

## 2023-09-29 DIAGNOSIS — N950 Postmenopausal bleeding: Secondary | ICD-10-CM | POA: Diagnosis present

## 2023-09-29 DIAGNOSIS — N179 Acute kidney failure, unspecified: Secondary | ICD-10-CM | POA: Diagnosis not present

## 2023-09-29 DIAGNOSIS — E559 Vitamin D deficiency, unspecified: Secondary | ICD-10-CM | POA: Diagnosis present

## 2023-09-29 DIAGNOSIS — J449 Chronic obstructive pulmonary disease, unspecified: Secondary | ICD-10-CM | POA: Diagnosis present

## 2023-09-29 DIAGNOSIS — Z7952 Long term (current) use of systemic steroids: Secondary | ICD-10-CM | POA: Diagnosis not present

## 2023-09-29 DIAGNOSIS — M109 Gout, unspecified: Secondary | ICD-10-CM | POA: Diagnosis not present

## 2023-09-29 DIAGNOSIS — R946 Abnormal results of thyroid function studies: Secondary | ICD-10-CM | POA: Insufficient documentation

## 2023-09-29 DIAGNOSIS — Z7951 Long term (current) use of inhaled steroids: Secondary | ICD-10-CM | POA: Diagnosis not present

## 2023-09-29 DIAGNOSIS — Z885 Allergy status to narcotic agent status: Secondary | ICD-10-CM

## 2023-09-29 DIAGNOSIS — T402X5A Adverse effect of other opioids, initial encounter: Secondary | ICD-10-CM | POA: Diagnosis present

## 2023-09-29 DIAGNOSIS — F40240 Claustrophobia: Secondary | ICD-10-CM | POA: Diagnosis present

## 2023-09-29 DIAGNOSIS — J811 Chronic pulmonary edema: Secondary | ICD-10-CM | POA: Diagnosis not present

## 2023-09-29 DIAGNOSIS — Z79899 Other long term (current) drug therapy: Secondary | ICD-10-CM | POA: Diagnosis not present

## 2023-09-29 DIAGNOSIS — N1832 Chronic kidney disease, stage 3b: Secondary | ICD-10-CM | POA: Diagnosis present

## 2023-09-29 DIAGNOSIS — Z9981 Dependence on supplemental oxygen: Secondary | ICD-10-CM | POA: Diagnosis not present

## 2023-09-29 DIAGNOSIS — Z86718 Personal history of other venous thrombosis and embolism: Secondary | ICD-10-CM

## 2023-09-29 DIAGNOSIS — E0500 Thyrotoxicosis with diffuse goiter without thyrotoxic crisis or storm: Secondary | ICD-10-CM | POA: Diagnosis not present

## 2023-09-29 DIAGNOSIS — I2510 Atherosclerotic heart disease of native coronary artery without angina pectoris: Secondary | ICD-10-CM | POA: Diagnosis present

## 2023-09-29 DIAGNOSIS — D649 Anemia, unspecified: Secondary | ICD-10-CM | POA: Diagnosis not present

## 2023-09-29 DIAGNOSIS — Z91048 Other nonmedicinal substance allergy status: Secondary | ICD-10-CM

## 2023-09-29 DIAGNOSIS — I890 Lymphedema, not elsewhere classified: Secondary | ICD-10-CM | POA: Diagnosis present

## 2023-09-29 DIAGNOSIS — I50813 Acute on chronic right heart failure: Secondary | ICD-10-CM | POA: Diagnosis not present

## 2023-09-29 DIAGNOSIS — I480 Paroxysmal atrial fibrillation: Secondary | ICD-10-CM | POA: Diagnosis present

## 2023-09-29 DIAGNOSIS — I110 Hypertensive heart disease with heart failure: Secondary | ICD-10-CM | POA: Diagnosis not present

## 2023-09-29 DIAGNOSIS — G8929 Other chronic pain: Secondary | ICD-10-CM | POA: Diagnosis present

## 2023-09-29 DIAGNOSIS — J441 Chronic obstructive pulmonary disease with (acute) exacerbation: Secondary | ICD-10-CM | POA: Diagnosis present

## 2023-09-29 DIAGNOSIS — E662 Morbid (severe) obesity with alveolar hypoventilation: Secondary | ICD-10-CM | POA: Diagnosis present

## 2023-09-29 DIAGNOSIS — M542 Cervicalgia: Secondary | ICD-10-CM | POA: Diagnosis not present

## 2023-09-29 DIAGNOSIS — I071 Rheumatic tricuspid insufficiency: Secondary | ICD-10-CM | POA: Diagnosis present

## 2023-09-29 DIAGNOSIS — M1009 Idiopathic gout, multiple sites: Secondary | ICD-10-CM | POA: Diagnosis present

## 2023-09-29 DIAGNOSIS — M810 Age-related osteoporosis without current pathological fracture: Secondary | ICD-10-CM | POA: Diagnosis present

## 2023-09-29 DIAGNOSIS — H579 Unspecified disorder of eye and adnexa: Secondary | ICD-10-CM

## 2023-09-29 DIAGNOSIS — K5909 Other constipation: Secondary | ICD-10-CM | POA: Insufficient documentation

## 2023-09-29 DIAGNOSIS — I712 Thoracic aortic aneurysm, without rupture, unspecified: Secondary | ICD-10-CM | POA: Diagnosis not present

## 2023-09-29 DIAGNOSIS — N183 Chronic kidney disease, stage 3 unspecified: Secondary | ICD-10-CM | POA: Diagnosis not present

## 2023-09-29 DIAGNOSIS — J4489 Other specified chronic obstructive pulmonary disease: Secondary | ICD-10-CM | POA: Diagnosis not present

## 2023-09-29 DIAGNOSIS — J9610 Chronic respiratory failure, unspecified whether with hypoxia or hypercapnia: Secondary | ICD-10-CM | POA: Diagnosis present

## 2023-09-29 DIAGNOSIS — I361 Nonrheumatic tricuspid (valve) insufficiency: Secondary | ICD-10-CM | POA: Diagnosis not present

## 2023-09-29 DIAGNOSIS — J9621 Acute and chronic respiratory failure with hypoxia: Secondary | ICD-10-CM | POA: Diagnosis present

## 2023-09-29 DIAGNOSIS — K921 Melena: Secondary | ICD-10-CM | POA: Diagnosis not present

## 2023-09-29 DIAGNOSIS — I5023 Acute on chronic systolic (congestive) heart failure: Secondary | ICD-10-CM | POA: Diagnosis not present

## 2023-09-29 DIAGNOSIS — D539 Nutritional anemia, unspecified: Secondary | ICD-10-CM | POA: Diagnosis not present

## 2023-09-29 MED ORDER — IPRATROPIUM-ALBUTEROL 0.5-2.5 (3) MG/3ML IN SOLN
3.0000 mL | RESPIRATORY_TRACT | Status: AC
  Administered 2023-09-30 (×3): 3 mL via RESPIRATORY_TRACT
  Filled 2023-09-29: qty 3

## 2023-09-29 MED ORDER — FUROSEMIDE 10 MG/ML IJ SOLN
80.0000 mg | Freq: Once | INTRAMUSCULAR | Status: AC
  Administered 2023-09-30: 80 mg via INTRAVENOUS
  Filled 2023-09-29: qty 8

## 2023-09-29 NOTE — ED Notes (Signed)
 Unable to collect labs she's a very hard stick.

## 2023-09-29 NOTE — ED Provider Triage Note (Signed)
 Emergency Medicine Provider Triage Evaluation Note  TYREKA HENNEKE , a 73 y.o. female  was evaluated in triage.  Pt complains of shortness of breath. She reports that she has been feeling SOB at all times with associated leg swelling and 5lb weight gain in the last week. On furosemide and taking as prescribed. Denies any chest pain.  Review of Systems  Positive: As above Negative: As above  Physical Exam  BP 105/70   Pulse 75   Temp 98.9 F (37.2 C) (Oral)   Resp (!) 22   SpO2 100%  Gen:   Awake, no distress   Resp:  Increased effort, mild rales heard towards left chest wall. MSK:   Moves extremities without difficulty  Other:  Bilateral lower leg pitting edema +3. Mild edema on face with identation of nasal cannula seen.  Medical Decision Making  Medically screening exam initiated at 6:34 PM.  Appropriate orders placed.  KHRISTY KALAN was informed that the remainder of the evaluation will be completed by another provider, this initial triage assessment does not replace that evaluation, and the importance of remaining in the ED until their evaluation is complete.    Smitty Knudsen, PA-C 09/30/23 [phone]

## 2023-09-29 NOTE — ED Provider Notes (Signed)
 [place] EMERGENCY DEPARTMENT AT [facility] Provider Note   CSN: [id number] Arrival date & time: 09/29/23  1745     History {Add pertinent medical, surgical, social history, OB history to HPI:1} Chief Complaint  Patient presents with   Shortness of Breath    Kim Anthony is a 73 y.o. female.  73 year old female who presents ER today secondary to worsening lower extremity edema, weight gain and mild shortness of breath.  As difficulty sleeping flat.  Recently discharged in the hospital about a week ago and is gained 5 pounds since that time.  Home health nurse said that she needed to come the ER secondary to swelling.  She is stable on her oxygen that she went home from the hospital with.  She states she quit drinking and smoking about 30 days ago.  She has been trying to watch her salt intake but it is difficult with recent travels and her daughter cooking for her.  Denies any fever.  No productive cough.  No trauma.   Shortness of Breath      Home Medications Prior to Admission medications   Medication Sig Start Date End Date Taking? Authorizing Provider  albuterol (VENTOLIN HFA) 108 (90 Base) MCG/ACT inhaler Inhale 1 puff into the lungs every 4 (four) hours as needed for wheezing or shortness of breath. 09/14/23   Katheran James, DO  FARXIGA 10 MG TABS tablet Take 1 tablet by mouth once daily 08/31/23   Kathleen Lime, MD  fluticasone (FLONASE) 50 MCG/ACT nasal spray Place 2 sprays into both nostrils daily. Patient taking differently: Place 1 spray into both nostrils daily. 05/17/23 05/16/24  Kathleen Lime, MD  Fluticasone-Umeclidin-Vilant (TRELEGY ELLIPTA) 100-62.5-25 MCG/ACT AEPB Inhale 1 puff into the lungs daily. 09/14/23   Katheran James, DO  gabapentin (NEURONTIN) 800 MG tablet Take 1 tablet (800 mg total) by mouth 3 (three) times daily. 09/15/23   Kathleen Lime, MD  HYDROcodone-acetaminophen (NORCO/VICODIN) 5-325 MG tablet Take 1 tablet by mouth every 4  (four) hours as needed for moderate pain (pain score 4-6). Diagnosis- chronic low back pain 09/17/23   Lovorn, Aundra Millet, MD  hydrOXYzine (ATARAX) 10 MG tablet Take 1 tablet (10 mg total) by mouth 3 (three) times daily as needed for anxiety. 09/22/23   Kathleen Lime, MD  losartan (COZAAR) 25 MG tablet Take 0.5 tablets (12.5 mg total) by mouth daily. 08/10/23 11/08/23  Jacklynn Ganong, FNP  metoprolol succinate (TOPROL-XL) 25 MG 24 hr tablet Take 1 tablet (25 mg total) by mouth 2 (two) times daily. 08/01/23   Monna Fam, MD  potassium chloride (KLOR-CON M) 10 MEQ tablet Take 2 tablets (20 mEq total) by mouth daily. 09/07/23 04/04/24  Kathleen Lime, MD  simvastatin (ZOCOR) 20 MG tablet Take 1 tablet (20 mg total) by mouth daily. 12/03/22   Willette Cluster, MD  tetrahydrozoline 0.05 % ophthalmic solution Place 1 drop into both eyes 3 (three) times daily. Patient not taking: Reported on 09/20/2023    [provider]  torsemide (DEMADEX) 20 MG tablet Take 6 tablets (120 mg total) by mouth daily. 09/22/23   Kathleen Lime, MD  warfarin (COUMADIN) 5 MG tablet Take 2.5mg  (half tablet) daily except on Saturday, take 5mg  (1 tablet). Get an INR check by Monday 09/27/23 09/22/23   Ginnie Smart, MD      Allergies    Lisinopril, Acetaminophen-codeine, Propoxyphene, Tape, and Tramadol    Review of Systems   Review of Systems  Respiratory:  Positive for shortness of  breath.     Physical Exam Updated Vital Signs BP 101/71 (BP Location: Right Arm)   Pulse 67   Temp 98.3 F (36.8 C) (Oral)   Resp 16   SpO2 100%  Physical Exam Vitals and nursing note reviewed.  Constitutional:      Appearance: She is well-developed.  HENT:     Head: Normocephalic and atraumatic.  Eyes:     Comments: Mild periorbital edema, mild edema in left fingers vs right  Cardiovascular:     Rate and Rhythm: Normal rate and regular rhythm.  Pulmonary:     Effort: No respiratory distress.     Breath sounds: No stridor.  Decreased breath sounds and wheezing present.  Abdominal:     General: There is no distension.  Musculoskeletal:     Cervical back: Normal range of motion.     Right lower leg: Edema (L>R) present.     Left lower leg: Edema present.  Neurological:     Mental Status: She is alert.     ED Results / Procedures / Treatments   Labs (all labs ordered are listed, but only abnormal results are displayed) Labs Reviewed  CBC  PROTIME-INR  BRAIN NATRIURETIC PEPTIDE  COMPREHENSIVE METABOLIC PANEL WITH GFR    EKG None  Radiology DG Chest 2 View Result Date: 09/29/2023 CLINICAL DATA:  Edema, short of breath EXAM: CHEST - 2 VIEW COMPARISON:  None Available. FINDINGS: Sternotomy wires overlies stable enlarged cardiac silhouette. There is mild patchy airspace disease. There is pleural calcifications in the LEFT upper lobe. Low lung volumes. IMPRESSION: Cardiomegaly and mild pulmonary edema pattern. Pleural calcifications. Electronically Signed   By: Genevive Bi M.D.   On: 09/29/2023 21:32    Procedures Procedures    Medications Ordered in ED Medications - No data to display  ED Course/ Medical Decision Making/ A&P                                 Medical Decision Making Amount and/or Complexity of Data Reviewed Labs: ordered. Radiology: ordered.  Some symptoms of chf but also some of copd. Will treat for both pending labs. Xr without consolidation.  ***  {Document critical care time when appropriate:1} {Document review of labs and clinical decision tools ie heart score, Chads2Vasc2 etc:1}  {Document your independent review of radiology images, and any outside records:1} {Document your discussion with family members, caretakers, and with consultants:1} {Document social determinants of health affecting pt's care:1} {Document your decision making why or why not admission, treatments were needed:1} Final Clinical Impression(s) / ED Diagnoses Final diagnoses:  None    Rx / DC  Orders ED Discharge Orders     None

## 2023-09-29 NOTE — ED Triage Notes (Signed)
 Pt arrives from home with her daughter due to increasing sob and swelling.  Pt states that the swelling has been increasing since yesterday.  Pt is on 2L [place].  No CP with this.

## 2023-09-29 NOTE — Telephone Encounter (Signed)
 Call from [facility] who transferred call to pt's daughter and Jae Dire PT with [facility]. Jae Dire stated when she arrived, pt was walking and short of breath. And when she weighed her, pt was shaking and trembling; difficulty to weigh pt. But daughter stated when she weighed her, pt had gained  more than 5 lbs. Jae Dire stated pt has pitting edema LUE and BLE's. O2 sat 92- 95%. Stated pt has been taking Torsemide 120 mg but has missed taking Metoprolol b/c pt was unable to see the pill since the pill and pill pak are white. Pt missed her appt this morning - pt stated she was feeling bad. No available appts this week at this time. I asked pt's daught and pt to go to UC to be evaluated.

## 2023-09-30 ENCOUNTER — Other Ambulatory Visit

## 2023-09-30 ENCOUNTER — Observation Stay (HOSPITAL_COMMUNITY)

## 2023-09-30 ENCOUNTER — Other Ambulatory Visit: Payer: Self-pay

## 2023-09-30 DIAGNOSIS — M542 Cervicalgia: Secondary | ICD-10-CM | POA: Diagnosis not present

## 2023-09-30 DIAGNOSIS — N179 Acute kidney failure, unspecified: Secondary | ICD-10-CM

## 2023-09-30 DIAGNOSIS — I2510 Atherosclerotic heart disease of native coronary artery without angina pectoris: Secondary | ICD-10-CM | POA: Diagnosis not present

## 2023-09-30 DIAGNOSIS — Z7901 Long term (current) use of anticoagulants: Secondary | ICD-10-CM | POA: Diagnosis not present

## 2023-09-30 DIAGNOSIS — N183 Chronic kidney disease, stage 3 unspecified: Secondary | ICD-10-CM

## 2023-09-30 DIAGNOSIS — Z79899 Other long term (current) drug therapy: Secondary | ICD-10-CM | POA: Diagnosis not present

## 2023-09-30 DIAGNOSIS — M7989 Other specified soft tissue disorders: Secondary | ICD-10-CM | POA: Diagnosis not present

## 2023-09-30 DIAGNOSIS — I361 Nonrheumatic tricuspid (valve) insufficiency: Secondary | ICD-10-CM

## 2023-09-30 DIAGNOSIS — D649 Anemia, unspecified: Secondary | ICD-10-CM | POA: Diagnosis not present

## 2023-09-30 DIAGNOSIS — I4892 Unspecified atrial flutter: Secondary | ICD-10-CM | POA: Diagnosis present

## 2023-09-30 DIAGNOSIS — R600 Localized edema: Secondary | ICD-10-CM | POA: Diagnosis not present

## 2023-09-30 DIAGNOSIS — I4891 Unspecified atrial fibrillation: Secondary | ICD-10-CM | POA: Diagnosis not present

## 2023-09-30 DIAGNOSIS — J449 Chronic obstructive pulmonary disease, unspecified: Secondary | ICD-10-CM

## 2023-09-30 DIAGNOSIS — I2729 Other secondary pulmonary hypertension: Secondary | ICD-10-CM | POA: Diagnosis present

## 2023-09-30 DIAGNOSIS — G8929 Other chronic pain: Secondary | ICD-10-CM | POA: Diagnosis present

## 2023-09-30 DIAGNOSIS — M79662 Pain in left lower leg: Secondary | ICD-10-CM | POA: Diagnosis not present

## 2023-09-30 DIAGNOSIS — R221 Localized swelling, mass and lump, neck: Secondary | ICD-10-CM | POA: Diagnosis not present

## 2023-09-30 DIAGNOSIS — I82612 Acute embolism and thrombosis of superficial veins of left upper extremity: Secondary | ICD-10-CM | POA: Diagnosis present

## 2023-09-30 DIAGNOSIS — I509 Heart failure, unspecified: Secondary | ICD-10-CM | POA: Diagnosis present

## 2023-09-30 DIAGNOSIS — I50810 Right heart failure, unspecified: Secondary | ICD-10-CM

## 2023-09-30 DIAGNOSIS — Z888 Allergy status to other drugs, medicaments and biological substances status: Secondary | ICD-10-CM | POA: Diagnosis not present

## 2023-09-30 DIAGNOSIS — I110 Hypertensive heart disease with heart failure: Secondary | ICD-10-CM | POA: Diagnosis not present

## 2023-09-30 DIAGNOSIS — E785 Hyperlipidemia, unspecified: Secondary | ICD-10-CM | POA: Diagnosis present

## 2023-09-30 DIAGNOSIS — I5033 Acute on chronic diastolic (congestive) heart failure: Secondary | ICD-10-CM | POA: Diagnosis present

## 2023-09-30 DIAGNOSIS — Z452 Encounter for adjustment and management of vascular access device: Secondary | ICD-10-CM | POA: Diagnosis not present

## 2023-09-30 DIAGNOSIS — E0500 Thyrotoxicosis with diffuse goiter without thyrotoxic crisis or storm: Secondary | ICD-10-CM | POA: Diagnosis present

## 2023-09-30 DIAGNOSIS — E876 Hypokalemia: Secondary | ICD-10-CM | POA: Diagnosis present

## 2023-09-30 DIAGNOSIS — I130 Hypertensive heart and chronic kidney disease with heart failure and stage 1 through stage 4 chronic kidney disease, or unspecified chronic kidney disease: Secondary | ICD-10-CM | POA: Diagnosis present

## 2023-09-30 DIAGNOSIS — Z952 Presence of prosthetic heart valve: Secondary | ICD-10-CM

## 2023-09-30 DIAGNOSIS — N1831 Chronic kidney disease, stage 3a: Secondary | ICD-10-CM | POA: Diagnosis present

## 2023-09-30 DIAGNOSIS — R251 Tremor, unspecified: Secondary | ICD-10-CM | POA: Diagnosis not present

## 2023-09-30 DIAGNOSIS — E860 Dehydration: Secondary | ICD-10-CM | POA: Diagnosis present

## 2023-09-30 DIAGNOSIS — F17210 Nicotine dependence, cigarettes, uncomplicated: Secondary | ICD-10-CM | POA: Diagnosis present

## 2023-09-30 DIAGNOSIS — J441 Chronic obstructive pulmonary disease with (acute) exacerbation: Secondary | ICD-10-CM | POA: Diagnosis present

## 2023-09-30 DIAGNOSIS — I480 Paroxysmal atrial fibrillation: Secondary | ICD-10-CM

## 2023-09-30 DIAGNOSIS — I50813 Acute on chronic right heart failure: Secondary | ICD-10-CM | POA: Diagnosis not present

## 2023-09-30 DIAGNOSIS — M109 Gout, unspecified: Secondary | ICD-10-CM

## 2023-09-30 DIAGNOSIS — Z9012 Acquired absence of left breast and nipple: Secondary | ICD-10-CM | POA: Diagnosis not present

## 2023-09-30 DIAGNOSIS — E662 Morbid (severe) obesity with alveolar hypoventilation: Secondary | ICD-10-CM | POA: Diagnosis present

## 2023-09-30 DIAGNOSIS — I5082 Biventricular heart failure: Secondary | ICD-10-CM | POA: Diagnosis present

## 2023-09-30 DIAGNOSIS — I5023 Acute on chronic systolic (congestive) heart failure: Secondary | ICD-10-CM | POA: Diagnosis not present

## 2023-09-30 DIAGNOSIS — I2781 Cor pulmonale (chronic): Secondary | ICD-10-CM | POA: Diagnosis present

## 2023-09-30 DIAGNOSIS — I3489 Other nonrheumatic mitral valve disorders: Secondary | ICD-10-CM | POA: Diagnosis not present

## 2023-09-30 DIAGNOSIS — Z9981 Dependence on supplemental oxygen: Secondary | ICD-10-CM | POA: Diagnosis not present

## 2023-09-30 LAB — BRAIN NATRIURETIC PEPTIDE: B Natriuretic Peptide: 723.7 pg/mL — ABNORMAL HIGH (ref 0.0–100.0)

## 2023-09-30 LAB — URIC ACID: Uric Acid, Serum: 16.1 mg/dL — ABNORMAL HIGH (ref 2.5–7.1)

## 2023-09-30 LAB — COMPREHENSIVE METABOLIC PANEL WITH GFR
ALT: 13 U/L (ref 0–44)
AST: 17 U/L (ref 15–41)
Albumin: 2.9 g/dL — ABNORMAL LOW (ref 3.5–5.0)
Alkaline Phosphatase: 129 U/L — ABNORMAL HIGH (ref 38–126)
Anion gap: 10 (ref 5–15)
BUN: 25 mg/dL — ABNORMAL HIGH (ref 8–23)
CO2: 25 mmol/L (ref 22–32)
Calcium: 8.9 mg/dL (ref 8.9–10.3)
Chloride: 100 mmol/L (ref 98–111)
Creatinine, Ser: 1.91 mg/dL — ABNORMAL HIGH (ref 0.44–1.00)
GFR, Estimated: 27 mL/min — ABNORMAL LOW (ref >60)
Glucose, Bld: 77 mg/dL (ref 70–99)
Potassium: 3.5 mmol/L (ref 3.5–5.1)
Sodium: 135 mmol/L (ref 135–145)
Total Bilirubin: 1.3 mg/dL — ABNORMAL HIGH (ref 0.0–1.2)
Total Protein: 6.8 g/dL (ref 6.5–8.1)

## 2023-09-30 LAB — T4, FREE: Free T4: 1.32 ng/dL — ABNORMAL HIGH (ref 0.61–1.12)

## 2023-09-30 LAB — SEDIMENTATION RATE: Sed Rate: 55 mm/h — ABNORMAL HIGH (ref 0–22)

## 2023-09-30 LAB — BLOOD GAS, VENOUS
Acid-Base Excess: 4.9 mmol/L — ABNORMAL HIGH (ref 0.0–2.0)
Bicarbonate: 31.4 mmol/L — ABNORMAL HIGH (ref 20.0–28.0)
O2 Saturation: 48.5 %
Patient temperature: 36.8
pCO2, Ven: 52 mmHg (ref 44–60)
pH, Ven: 7.38 (ref 7.25–7.43)
pO2, Ven: <31 mmHg — CL (ref 32–45)

## 2023-09-30 LAB — LIPID PANEL
Cholesterol: 100 mg/dL (ref 0–200)
HDL: 35 mg/dL — ABNORMAL LOW (ref >40)
LDL Cholesterol: 48 mg/dL (ref 0–99)
Total CHOL/HDL Ratio: 2.9 ratio
Triglycerides: 83 mg/dL (ref <150)
VLDL: 17 mg/dL (ref 0–40)

## 2023-09-30 LAB — CBC
HCT: 31.9 % — ABNORMAL LOW (ref 36.0–46.0)
Hemoglobin: 9.5 g/dL — ABNORMAL LOW (ref 12.0–15.0)
MCH: 27.9 pg (ref 26.0–34.0)
MCHC: 29.8 g/dL — ABNORMAL LOW (ref 30.0–36.0)
MCV: 93.5 fL (ref 80.0–100.0)
Platelets: 337 10*3/uL (ref 150–400)
RBC: 3.41 MIL/uL — ABNORMAL LOW (ref 3.87–5.11)
RDW: 20 % — ABNORMAL HIGH (ref 11.5–15.5)
WBC: 7.6 10*3/uL (ref 4.0–10.5)
nRBC: 0 % (ref 0.0–0.2)

## 2023-09-30 LAB — PROTIME-INR
INR: 1.4 — ABNORMAL HIGH (ref 0.8–1.2)
Prothrombin Time: 17.6 s — ABNORMAL HIGH (ref 11.4–15.2)

## 2023-09-30 LAB — HEPARIN LEVEL (UNFRACTIONATED): Heparin Unfractionated: 0.16 [IU]/mL — ABNORMAL LOW (ref 0.30–0.70)

## 2023-09-30 LAB — TSH: TSH: 6.433 u[IU]/mL — ABNORMAL HIGH (ref 0.350–4.500)

## 2023-09-30 LAB — MAGNESIUM: Magnesium: 2 mg/dL (ref 1.7–2.4)

## 2023-09-30 MED ORDER — COLCHICINE 0.3 MG HALF TABLET
0.3000 mg | ORAL_TABLET | Freq: Once | ORAL | Status: AC
  Administered 2023-09-30: 0.3 mg via ORAL
  Filled 2023-09-30: qty 1

## 2023-09-30 MED ORDER — WARFARIN SODIUM 2.5 MG PO TABS: 2.5000 mg | ORAL_TABLET | ORAL | Status: DC

## 2023-09-30 MED ORDER — DAPAGLIFLOZIN PROPANEDIOL 10 MG PO TABS
10.0000 mg | ORAL_TABLET | Freq: Every day | ORAL | Status: DC
  Administered 2023-09-30: 10 mg via ORAL
  Filled 2023-09-30: qty 1

## 2023-09-30 MED ORDER — COLCHICINE 0.6 MG PO TABS
0.6000 mg | ORAL_TABLET | Freq: Once | ORAL | Status: AC
  Administered 2023-09-30: 0.6 mg via ORAL
  Filled 2023-09-30: qty 1

## 2023-09-30 MED ORDER — WARFARIN SODIUM 5 MG PO TABS
5.0000 mg | ORAL_TABLET | Freq: Once | ORAL | Status: AC
  Administered 2023-09-30: 5 mg via ORAL
  Filled 2023-09-30 (×2): qty 1

## 2023-09-30 MED ORDER — MILRINONE LACTATE IN DEXTROSE 20-5 MG/100ML-% IV SOLN
0.2500 ug/kg/min | INTRAVENOUS | Status: DC
  Administered 2023-09-30 – 2023-10-05 (×7): 0.25 ug/kg/min via INTRAVENOUS
  Filled 2023-09-30 (×9): qty 100

## 2023-09-30 MED ORDER — FUROSEMIDE 10 MG/ML IJ SOLN: 80.0000 mg | Freq: Two times a day (BID) | INTRAMUSCULAR | Status: DC

## 2023-09-30 MED ORDER — ALLOPURINOL 100 MG PO TABS: 50.0000 mg | ORAL_TABLET | ORAL | Status: DC

## 2023-09-30 MED ORDER — FUROSEMIDE 10 MG/ML IJ SOLN
160.0000 mg | Freq: Two times a day (BID) | INTRAVENOUS | Status: AC
  Administered 2023-09-30 (×2): 160 mg via INTRAVENOUS
  Filled 2023-09-30 (×3): qty 16

## 2023-09-30 MED ORDER — NICOTINE 7 MG/24HR TD PT24
7.0000 mg | MEDICATED_PATCH | Freq: Every day | TRANSDERMAL | Status: DC
  Administered 2023-09-30 – 2023-10-08 (×7): 7 mg via TRANSDERMAL
  Filled 2023-09-30 (×9): qty 1

## 2023-09-30 MED ORDER — ACETAMINOPHEN 325 MG PO TABS
650.0000 mg | ORAL_TABLET | Freq: Four times a day (QID) | ORAL | Status: DC | PRN
  Administered 2023-10-01 – 2023-10-02 (×2): 650 mg via ORAL
  Filled 2023-09-30 (×2): qty 2

## 2023-09-30 MED ORDER — IPRATROPIUM-ALBUTEROL 0.5-2.5 (3) MG/3ML IN SOLN: 3.0000 mL | Freq: Four times a day (QID) | RESPIRATORY_TRACT | Status: DC | PRN

## 2023-09-30 MED ORDER — BUDESON-GLYCOPYRROL-FORMOTEROL 160-9-4.8 MCG/ACT IN AERO
2.0000 | INHALATION_SPRAY | Freq: Two times a day (BID) | RESPIRATORY_TRACT | Status: DC
  Administered 2023-10-01 – 2023-10-08 (×14): 2 via RESPIRATORY_TRACT
  Filled 2023-09-30 (×2): qty 5.9

## 2023-09-30 MED ORDER — GABAPENTIN 100 MG PO CAPS
200.0000 mg | ORAL_CAPSULE | Freq: Three times a day (TID) | ORAL | Status: DC
  Administered 2023-09-30 – 2023-10-08 (×25): 200 mg via ORAL
  Filled 2023-09-30 (×25): qty 2

## 2023-09-30 MED ORDER — HEPARIN (PORCINE) 25000 UT/250ML-% IV SOLN
1650.0000 [IU]/h | INTRAVENOUS | Status: DC
  Administered 2023-09-30: 1400 [IU]/h via INTRAVENOUS
  Administered 2023-10-01 – 2023-10-04 (×6): 1650 [IU]/h via INTRAVENOUS
  Filled 2023-09-30 (×7): qty 250

## 2023-09-30 MED ORDER — ACETAMINOPHEN 650 MG RE SUPP: 650.0000 mg | Freq: Four times a day (QID) | RECTAL | Status: DC | PRN

## 2023-09-30 MED ORDER — SIMVASTATIN 20 MG PO TABS
20.0000 mg | ORAL_TABLET | Freq: Every day | ORAL | Status: DC
  Administered 2023-09-30 – 2023-10-07 (×8): 20 mg via ORAL
  Filled 2023-09-30 (×8): qty 1

## 2023-09-30 MED ORDER — WARFARIN - PHARMACIST DOSING INPATIENT: Freq: Every day | Status: DC

## 2023-09-30 MED ORDER — ALBUTEROL SULFATE (2.5 MG/3ML) 0.083% IN NEBU
3.0000 mL | INHALATION_SOLUTION | RESPIRATORY_TRACT | Status: DC | PRN
Start: 2023-09-30 — End: 2023-10-08

## 2023-09-30 MED ORDER — HYDROCODONE-ACETAMINOPHEN 5-325 MG PO TABS
1.0000 | ORAL_TABLET | ORAL | Status: DC | PRN
  Administered 2023-09-30 – 2023-10-02 (×10): 1 via ORAL
  Filled 2023-09-30 (×10): qty 1

## 2023-09-30 MED ORDER — HYDROXYZINE HCL 10 MG PO TABS
10.0000 mg | ORAL_TABLET | Freq: Three times a day (TID) | ORAL | Status: DC | PRN
Start: 2023-09-30 — End: 2023-10-08
  Administered 2023-09-30 – 2023-10-03 (×5): 10 mg via ORAL
  Filled 2023-09-30 (×5): qty 1

## 2023-09-30 MED ORDER — ENOXAPARIN SODIUM 30 MG/0.3ML IJ SOSY: 30.0000 mg | PREFILLED_SYRINGE | INTRAMUSCULAR | Status: DC

## 2023-09-30 MED ORDER — PREDNISONE 20 MG PO TABS
20.0000 mg | ORAL_TABLET | Freq: Every day | ORAL | Status: AC
  Administered 2023-09-30 – 2023-10-02 (×3): 20 mg via ORAL
  Filled 2023-09-30: qty 2
  Filled 2023-09-30 (×2): qty 1

## 2023-09-30 MED ORDER — SENNOSIDES-DOCUSATE SODIUM 8.6-50 MG PO TABS
1.0000 | ORAL_TABLET | Freq: Every evening | ORAL | Status: DC | PRN
  Administered 2023-10-03 – 2023-10-07 (×2): 1 via ORAL
  Filled 2023-09-30 (×2): qty 1

## 2023-09-30 MED ORDER — POTASSIUM CHLORIDE 20 MEQ PO PACK
40.0000 meq | PACK | Freq: Two times a day (BID) | ORAL | Status: AC
Start: 2023-09-30 — End: 2023-09-30
  Administered 2023-09-30 (×2): 40 meq via ORAL
  Filled 2023-09-30 (×2): qty 2

## 2023-09-30 NOTE — Plan of Care (Signed)

## 2023-09-30 NOTE — Transitions of Care (Post Inpatient/ED Visit) (Signed)
 Care Management  Transitions of Care Program Transitions of Care Post-discharge week 4  09/30/2023 Name: Kim Anthony [name]: [id number] DOB: 1951-06-09  Subjective: Kim Anthony is a 73 y.o. year old female who is a primary care patient of Kathleen Lime, MD. The Care Management team reviewed record and noted patient admitted to hospital 09/29/23. TOC 30 day program is being closed.   Plan: [facility] RN will monitor for discharge and will begin new outreaches.   Hilbert Odor RN, CCM [place]  VBCI-Population Health RN Care Manager [phone]

## 2023-09-30 NOTE — Progress Notes (Addendum)
 Subjective SOB has improved since admission. Endorses diffuse joint pain, specifically left knee, bilateral hands.  Physical exam Blood pressure (!) 104/58, pulse 80, temperature 98 F (36.7 C), resp. rate 16, height 5\' 5"  (1.651 m), weight 77.4 kg, SpO2 95%.  Constitutional: lying in bed, no acute distress Cardiovascular: regular rate and rhythm, mechanical valve click, no appreciable JVD, L>R LEE Pulmonary/Chest: Bibasilar rales MSK: Bilateral MCP joint with TTP, no clear erythema/swelling, left knee with swelling and TTP, no overt effusion/ecchymosis, left calf with tense swelling/TTP   Weight change:   No intake or output data in the 24 hours ending 09/30/23 1038 Net IO Since Admission: No IO data has been entered for this period [09/30/23 1038]  Labs, images, and other studies    Latest Ref Rng & Units 09/30/2023   12:05 AM 09/22/2023    3:34 AM 09/21/2023    4:53 AM  CBC  WBC 4.0 - 10.5 K/uL 7.6  7.4  8.9   Hemoglobin 12.0 - 15.0 g/dL 9.5  16.1  9.8   Hematocrit 36.0 - 46.0 % 31.9  34.2  33.3   Platelets 150 - 400 K/uL 337  332  323        Latest Ref Rng & Units 09/30/2023   12:05 AM 09/22/2023    6:40 AM 09/21/2023    4:53 AM  BMP  Glucose 70 - 99 mg/dL 77  96  096   BUN 8 - 23 mg/dL 25  65  71   Creatinine 0.44 - 1.00 mg/dL 0.45  4.09  8.11   Sodium 135 - 145 mmol/L 135  133  134   Potassium 3.5 - 5.1 mmol/L 3.5  4.1  4.0   Chloride 98 - 111 mmol/L 100  93  91   CO2 22 - 32 mmol/L 25  27  27    Calcium 8.9 - 10.3 mg/dL 8.9  8.0  8.1      Assessment and plan Hospital day 0  Kim Anthony is a 73 y.o. person living with a history of A-fib, mitral valve replacement on warfarin, COPD, CKD 3B, congestive heart failure with severely reduced right ventricular ejection fraction, and history of breast cancer with who presented with weight gain, shortness of breath, malaise and admitted for volume overload on hospital day 0   Dyspnea Heart failure with  severely reduced right ventricular ejection fraction COPD HFT following, appreciate recommendations. Currently saturating within goal on home 2 L O2. Symptoms have improved. Will tentatively plan for continued diuresis today with IV Lasix 160 once (potentially BID based on urine output). Just need to be cautious with RV dysfunction. Though will defer to our HF experts. -Hold home BP regimen given soft BP -Strict I's and O's -Trend BMP -Replete K and Mg as needed -Standing weights -Continue home douneb prn   Mechanical mitral valve A-fib INR subtherapeutic on admission. Order Heparin per pharmacy for bridging coumadin.   Left greater than right lower extremity edema On warfarin, however INR subtherapeutic on admission.  She does have tenderness to palpation of the left calf, left worse than right lower extremity edema  She has history of DVT.  Will obtain Doppler studies to rule out DVT.   Tremor Improved since admission but still bothersome. Per chart review, seems to have been noted in recent past.  Unclear etiology at this time. TSH is elevated so will check free T4.  Gout Seems to be in an acute flare but somewhat mixed presentation. Uric acid elevated. Will check ESR. Will start renally dosed Colchicine and plan on renally dosed ULT with Allopurinol in the future.   Normocytic anemia Stable. -Trend CBC   Chronic neck lower back pain On Norco 10/23/2023 every 4 hours as needed at home. Will initiate renally dose gabapentin, 200 mg 3 times daily.    Hypertension Hold antihypertensives given soft BP   Hyperlipidemia Continue simvastatin 20 mg daily. LDL is [address] DO 09/30/2023, 10:38 AM  Pager: [phone] After 5pm or weekend: [phone]

## 2023-09-30 NOTE — Progress Notes (Signed)
 PHARMACY - ANTICOAGULATION CONSULT NOTE  Pharmacy Consult for Coumadin Indication:  MVR  Allergies  Allergen Reactions   Lisinopril Swelling   Acetaminophen-Codeine Itching   Propoxyphene Itching    Darvocet   Tape Rash    Plastic   Tramadol Itching, Nausea And Vomiting and Rash    Patient Measurements: Height: 5\' 5"  (165.1 cm) Weight: 77.4 kg (170 lb 10.2 oz) IBW/kg (Calculated) : 57 HEPARIN DW (KG): 73.1  Vital Signs: Temp: 98.2 F (36.8 C) (04/10 0630) Temp Source: Oral (04/10 0630) BP: 115/76 (04/10 0630) Pulse Rate: 71 (04/10 0630)  Labs: Recent Labs    09/30/23 0005  HGB 9.5*  HCT 31.9*  PLT 337  LABPROT 17.6*  INR 1.4*  CREATININE 1.91*    Estimated Creatinine Clearance: 27 mL/min (A) (by C-G formula based on SCr of 1.91 mg/dL (H)).   Medical History: Past Medical History:  Diagnosis Date   Acute delirium 08/30/2023   Acute GI bleeding 08/18/2023   Arthritis    Asthma    Breast cancer (HCC) 2001   Left Breast Cancer   CHF (congestive heart failure) (HCC)    COPD (chronic obstructive pulmonary disease) (HCC)    Coronary artery disease    GERD (gastroesophageal reflux disease)    History of mitral valve replacement with mechanical valve    HLD (hyperlipidemia)    Hypertension    Pulmonary embolism (HCC) 2021    Medications:  Scheduled:   budeson-glycopyrrolate-formoterol  2 puff Inhalation BID   gabapentin  200 mg Oral TID   potassium chloride  40 mEq Oral BID   simvastatin  20 mg Oral q1800   warfarin  5 mg Oral ONCE-1600   Warfarin - Pharmacist Dosing Inpatient   Does not apply q1600   Infusions:   furosemide     heparin     PRN: acetaminophen **OR** acetaminophen, albuterol, HYDROcodone-acetaminophen, hydrOXYzine, ipratropium-albuterol, senna-docusate  Assessment: Pt with MVR who was readmitted for CHF exacerbation. INR 1.4 on admission so we will bridge with heparin.   Hgb 9.5, plt wnl  Goal of Therapy:  INR 2.5-3.5 Monitor  platelets by anticoagulation protocol: Yes   Plan:  Heparin 1400 units/hr 8 hr HL Coumadin 5mg  PO x1 Daily INR, HL  Ulyses Southward, PharmD, [place], AAHIVP, CPP Infectious Disease Pharmacist 09/30/2023 7:36 AM

## 2023-09-30 NOTE — Progress Notes (Signed)
 Heart Failure Navigator Progress Note  Assessed for Heart & Vascular TOC clinic readiness.  Patient does not meet criteria due to Advanced Heart Failure Team patient of Dr. Gala Romney. .   Navigator will sign off at this time.   Rhae Hammock, BSN, Scientist, clinical (histocompatibility and immunogenetics) Only

## 2023-09-30 NOTE — Progress Notes (Signed)
 Orthopedic Tech Progress Note Patient Details:  Kim Anthony 07-May-1951 [id number]  Ortho Devices Type of Ortho Device: Ace wrap, Unna boot Ortho Device/Splint Location: BLE Ortho Device/Splint Interventions: Ordered, Application, Adjustment   Post Interventions Patient Tolerated: Well Instructions Provided: Care of device  [name] 09/30/2023, 5:17 PM

## 2023-09-30 NOTE — H&P (Addendum)
 Date: 09/30/2023               Patient Name:  Kim Anthony [name]: [id number]  DOB: 22-Jul-1950 Age / Sex: 73 y.o., female   PCP: Kathleen Lime, MD         Medical Service: Internal Medicine Teaching Service         Attending Physician: Dr. Miguel Aschoff, MD      First Contact: Dr. Carmina Miller, DO Pager [phone]    Second Contact: Dr. Rocky Morel, DO          After Hours (After 5p/  First Contact Pager: [phone]  weekends / holidays): Second Contact Pager: [phone]   SUBJECTIVE   Chief Complaint: Dyspnea  History of Present Illness:   JESSICE Anthony is a 73 y.o. person living with a history of A-fib, mitral valve replacement on warfarin, COPD, CKD 3B, congestive heart failure with severely reduced right ventricular ejection fraction, prediabetes and history of breast cancer with who presented with weight gain, shortness of breath.  The patient states that yesterday she began having little bit of increased shortness of breath.  She states that her home health aide has been weighing her, and she has gained weight which prompted her to come in for evaluation.  She also endorses ongoing tremor for the last 2 to 3 months, she says her tremor was very bad yesterday and caused her to miss her doctor's appointment.  She does endorse some orthopnea, wheezing, swelling in her legs, hands, and face.  She does have asymmetric left greater than right lower extremity edema, says this is not normal for her.  At first she states that has been taking all her medications including her diuretic regimen which includes 120 of torsemide daily.  Upon further questioning she does admit that she misses up to half of her doses.  She does have history of gout and says that she has had some ongoing pain in her wrists.  She otherwise denies fevers, chills, chest pain, back pain, syncope, presyncope, nausea, vomiting, melena, hematochezia, dysuria.  ED Course: Vitals within normal limits, on 2 L nasal cannula  at home Labs: CBC with redemonstration of normocytic anemia.  PT/INR slightly elevated, below goal.  INR 1.4.  BNP 723, chronically elevated.  CMP demonstrating greatly improved BUN, stable creatinine.  Albumin low at 2.9, alk phos elevated to 129, T. bili 1.3. Chest x-ray demonstrating cardiomegaly and mild pulmonary edema Patient was given 80 IV Lasix, DuoNebs  Meds:  Current Meds  Medication Sig   albuterol (VENTOLIN HFA) 108 (90 Base) MCG/ACT inhaler Inhale 1 puff into the lungs every 4 (four) hours as needed for wheezing or shortness of breath.   FARXIGA 10 MG TABS tablet Take 1 tablet by mouth once daily   fluticasone (FLONASE) 50 MCG/ACT nasal spray Place 2 sprays into both nostrils daily. (Patient taking differently: Place 1 spray into both nostrils daily.)   Fluticasone-Umeclidin-Vilant (TRELEGY ELLIPTA) 100-62.5-25 MCG/ACT AEPB Inhale 1 puff into the lungs daily.   gabapentin (NEURONTIN) 800 MG tablet Take 1 tablet (800 mg total) by mouth 3 (three) times daily.   HYDROcodone-acetaminophen (NORCO/VICODIN) 5-325 MG tablet Take 1 tablet by mouth every 4 (four) hours as needed for moderate pain (pain score 4-6). Diagnosis- chronic low back pain   hydrOXYzine (ATARAX) 10 MG tablet Take 1 tablet (10 mg total) by mouth 3 (three) times daily as needed for anxiety.   losartan (COZAAR) 25 MG tablet Take 0.5 tablets (12.5 mg  total) by mouth daily.   metoprolol succinate (TOPROL-XL) 25 MG 24 hr tablet Take 1 tablet (25 mg total) by mouth 2 (two) times daily.   potassium chloride (KLOR-CON M) 10 MEQ tablet Take 2 tablets (20 mEq total) by mouth daily.   simvastatin (ZOCOR) 20 MG tablet Take 1 tablet (20 mg total) by mouth daily.   torsemide (DEMADEX) 20 MG tablet Take 6 tablets (120 mg total) by mouth daily.   warfarin (COUMADIN) 5 MG tablet Take 2.5mg  (half tablet) daily except on Saturday, take 5mg  (1 tablet). Get an INR check by Monday 09/27/23    Past Medical History  Past Surgical History:   Procedure Laterality Date   APPLICATION OF A-CELL OF CHEST/ABDOMEN Left 01/27/2019   Procedure: APPLICATION OF A-CELL OF CHEST;  Surgeon: Peggye Form, DO;  Location: [facility] OR;  Service: Plastics;  Laterality: Left;   APPLICATION OF WOUND VAC N/A 01/27/2019   Procedure: APPLICATION OF WOUND VAC;  Surgeon: Peggye Form, DO;  Location: [facility] OR;  Service: Plastics;  Laterality: N/A;   BREAST SURGERY Left 2001   for CA   CARDIOVERSION N/A 02/26/2022   Procedure: CARDIOVERSION;  Surgeon: Little Ishikawa, MD;  Location: [facility] ENDOSCOPY;  Service: Cardiovascular;  Laterality: N/A;   COLONOSCOPY WITH PROPOFOL N/A 06/07/2018   Procedure: COLONOSCOPY WITH PROPOFOL;  Surgeon: Charlott Rakes, MD;  Location: [facility] ENDOSCOPY;  Service: Endoscopy;  Laterality: N/A;   DEBRIDEMENT AND CLOSURE WOUND Left 12/28/2018   Procedure: Debridement And Closure Wound;  Surgeon: Abigail Miyamoto, MD;  Location: [facility] OR;  Service: General;  Laterality: Left;   ESOPHAGOGASTRODUODENOSCOPY (EGD) WITH PROPOFOL N/A 08/24/2023   Procedure: ESOPHAGOGASTRODUODENOSCOPY (EGD) WITH PROPOFOL;  Surgeon: Lynann Bologna, DO;  Location[facility] ENDOSCOPY;  Service: Gastroenterology;  Laterality: N/A;   INCISION AND DRAINAGE OF WOUND Left 01/27/2019   Procedure: IRRIGATION AND DEBRIDEMENT OF CHEST WALL;  Surgeon: Peggye Form, DO;  Location: [facility] OR;  Service: Plastics;  Laterality: Left;   INTRAMEDULLARY (IM) NAIL INTERTROCHANTERIC N/A 09/23/2022   Procedure: INTRAMEDULLARY (IM) NAIL INTERTROCHANTERIC;  Surgeon: Tarry Kos, MD;  Location: [facility] OR;  Service: Orthopedics;  Laterality: N/A;   KNEE ARTHROSCOPY WITH MEDIAL MENISECTOMY Left 08/06/2021   Procedure: KNEE ARTHROSCOPY WITH PARTIAL MEDIAL MENISECTOMY, DEBRIDEMENT;  Surgeon: Jene Every, MD;  Location: [facility] ORS;  Service: Orthopedics;  Laterality: Left;  1 HR   LATISSIMUS FLAP TO BREAST Left 01/18/2019   Procedure: LEFT LATISSIMUS FLAP TO BREAST;  Surgeon:  Peggye Form, DO;  Location: [facility] OR;  Service: Plastics;  Laterality: Left;   MASTECTOMY Left    MITRAL VALVE REPLACEMENT     mechanical   POLYPECTOMY  06/07/2018   Procedure: POLYPECTOMY;  Surgeon: Charlott Rakes, MD;  Location: [facility] ENDOSCOPY;  Service: Endoscopy;;   RIGHT HEART CATH N/A 08/20/2023   Procedure: RIGHT HEART CATH;  Surgeon: Dolores Patty, MD;  Location: [facility] INVASIVE CV LAB;  Service: Cardiovascular;  Laterality: N/A;   RIGHT/LEFT HEART CATH AND CORONARY ANGIOGRAPHY N/A 01/09/2021   Procedure: RIGHT/LEFT HEART CATH AND CORONARY ANGIOGRAPHY;  Surgeon: Dolores Patty, MD;  Location: [facility] INVASIVE CV LAB;  Service: Cardiovascular;  Laterality: N/A;   WOUND DEBRIDEMENT Left 12/28/2018   Procedure: EXCISION OF LEFT CHRONIC CHEST WALL WOUND;  Surgeon: Abigail Miyamoto, MD;  Location: [facility] OR;  Service: General;  Laterality: Left;    Social:  Lives With: Home with daughter  Occupation: Retired, grocery store  Support: Daughter and Engineer, civil (consulting). Comes once a week Level  of Function: Independent with ADLS and iADLS.  Uses cane/walker at home PCP: Kathleen Lime MD Substances: Smoker - 5-6 cigarettes daily, quit 30 days ago 30 years pack a day Doesn't drink Used to drink 1-2 beers a day  No other substance  No history of IVDU    Allergies: Allergies as of 09/29/2023 - Review Complete 09/29/2023  Allergen Reaction Noted   Lisinopril Swelling 01/07/2021   Acetaminophen-codeine Itching 07/10/2015   Propoxyphene Itching 01/18/2019   Tape Rash 03/09/2019   Tramadol Itching, Nausea And Vomiting, and Rash 07/10/2015    Review of Systems: Please see HPI and A&P for pertinent positives and negatives.   OBJECTIVE:   Physical Exam: Blood pressure 100/72, pulse 68, temperature 98.3 F (36.8 C), temperature source Oral, resp. rate 20, SpO2 91%.  Constitutional: Chronically ill-appearing, no acute distress Neck: Difficult to assess JVD secondary to habitus   Cardiovascular: regular rate and rhythm, sharp mechanical valve click  Pulmonary/Chest: Bibasilar rales, mild expiratory wheezing Abdominal: soft, non-tender, non-distended MSK: 2+ pitting edema of the lower extremities bilaterally to the knees.  Left greater than right edema, tenderness to palpation of the left posteromedial calf.  Positive Homans' sign.  Swelling with tenderness palpation appreciated of the wrist bilaterally. Psych: Pleasant affect, linear speech  Labs: CBC    Component Value Date/Time   WBC 7.6 09/30/2023 0005   RBC 3.41 (L) 09/30/2023 0005   HGB 9.5 (L) 09/30/2023 0005   HGB 12.8 01/13/2022 1038   HCT 31.9 (L) 09/30/2023 0005   HCT 38.8 01/13/2022 1038   PLT 337 09/30/2023 0005   PLT 427 01/13/2022 1038   MCV 93.5 09/30/2023 0005   MCV 96 01/13/2022 1038   MCH 27.9 09/30/2023 0005   MCHC 29.8 (L) 09/30/2023 0005   RDW 20.0 (H) 09/30/2023 0005   RDW 11.4 (L) 01/13/2022 1038   LYMPHSABS 1.9 08/23/2023 0454   LYMPHSABS 3.0 04/21/2018 1624   MONOABS 2.7 (H) 08/23/2023 0454   EOSABS 0.4 08/23/2023 0454   EOSABS 0.2 04/21/2018 1624   BASOSABS 0.1 08/23/2023 0454   BASOSABS 0.1 04/21/2018 1624     CMP     Component Value Date/Time   NA 135 09/30/2023 0005   NA 134 04/16/2023 1036   K 3.5 09/30/2023 0005   CL 100 09/30/2023 0005   CO2 25 09/30/2023 0005   GLUCOSE 77 09/30/2023 0005   BUN 25 (H) 09/30/2023 0005   BUN 20 04/16/2023 1036   CREATININE 1.91 (H) 09/30/2023 0005   CALCIUM 8.9 09/30/2023 0005   PROT 6.8 09/30/2023 0005   PROT 7.9 01/27/2021 1015   ALBUMIN 2.9 (L) 09/30/2023 0005   ALBUMIN 4.6 01/27/2021 1015   AST 17 09/30/2023 0005   ALT 13 09/30/2023 0005   ALKPHOS 129 (H) 09/30/2023 0005   BILITOT 1.3 (H) 09/30/2023 0005   BILITOT 0.5 01/27/2021 1015   GFRNONAA 27 (L) 09/30/2023 0005   GFRAA 104 02/28/2020 1159    ASSESSMENT & PLAN:   Assessment & Plan by Problem: Principal Problem:   Acute exacerbation of CHF (congestive heart  failure) (HCC)   ARITZEL KRUSEMARK is a 72 y.o. person living with a history of A-fib, mitral valve replacement on warfarin, COPD, CKD 3B, congestive heart failure with severely reduced right ventricular ejection fraction, and history of breast cancer with who presented with weight gain, shortness of breath, malaise and admitted for volume overload on hospital day 0  Dyspnea CKD 3B Heart failure with severely reduced right ventricular ejection  fraction COPD Patient presenting with signs and symptoms of volume overload.she has been compliant with her outpatient diuretic regimen, but it seems like her compliance with diet may have been suboptimal.  She has been given 80 mg IV Lasix with limited urine output.  She does take 120 of torsemide outpatient daily, she may require additional IV diuresis. BUN actually improved from prior, which is reassuring.  She is saturating 92% on room air for me during exam.  She does have some wheezing on exam, otherwise denies fever, chills, cough or increased sputum production. Plan: - Careful diuresis in setting of severely reduced right ventricular ventricular function. -Status post IV Lasix 80 mg ED, may require additional dosing given she is on 120 torsemide daily. -Hold home BP regimen -Strict I's and O's -Trend BMP -Standing weights -Magnesium level - Given Duonebs in ED - continue home regimen, douneb prn  Mechanical mitral valve A-fib PT 17.6, INR 1.4 on admit.  Subtherapeutic at this time.  She follows with Dr. Michaell Cowing in the Coumadin clinic.  She has had history of supratherapeutic INR in the past, as high as 7.210 days ago.  It does seem that she has difficulty with medication adherence at times, she states that her daughter helps her with her medications. - Continue Warfarin, pharmacy order placed. Will likely need to be bridged.   Left greater than right lower extremity edema On warfarin, however INR subtherapeutic on admission.  She does have  tenderness to palpation of the left calf, left worse than right lower extremity edema, positive Homans' sign, recent hospitalization.  She has history of DVT.  Will obtain Doppler studies to rule out DVT.  Tremor Per chart review seems to have been noted in recent past.  Unclear etiology at this time, though likely essential. I do see a TSH previously elevated about 1 year ago.  -TSH  Gout Recently treated with prednisone taper for gout flare.  Endorses some ongoing joint pain, though she does also have chronic joint pain from arthritis.   Creatinine clearance less than 30, complicating potential treatment with colchicine or NSAIDs. Consider treatment with prednisone. Not currently on chronic gout therapy.  May benefit from outpatient allopurinol.  - Continue home pain regimen, unclear if pain is 2-2 gout versus known degenerative joint disease  Normocytic anemia Recent labs on 09/06/2023 demonstrating decreased iron, decreased saturation ratios, low normal ferritin. Likely iron deficiency anemia.  She is normocytic with increased RDW.  Would likely benefit from iron therapy.  Chronic lower back pain On Norco 10/23/2023 every 4 hours as needed at home.  Prescribed gabapentin 800 mg 3 times daily, creatinine clearance 26.2.  Will initiate renally dose gabapentin, 200 mg 3 times daily.  Endorses ongoing chronic back and neck pain.  Hypertension Hold antihypertensives given heart failure exacerbation  Hyperlipidemia Continue simvastatin 20 mg daily Repeat lipid panel  Diet: Heart Healthy VTE: Enoxaparin IVF: None,None Code: Full  Prior to Admission Living Arrangement: Home, living with daughter Anticipated Discharge Location: Home Barriers to Discharge: Clinical improvement  Dispo: Admit patient to Observation with expected length of stay less than 2 midnights.  Signed: Lovie Macadamia, MD Internal Medicine Resident PGY-1  09/30/2023, 5:08 AM

## 2023-09-30 NOTE — Progress Notes (Signed)
 Consent obtained for bedside PICC placement after discussion with patient. Able to cannulate R brachial veins easily, but unable to thread guidewire past axilla before meeting resistance. Unable to use L arm due to L mastectomy with Lymph node removal. Recommend IR placement if PICC line still desired. Primary RN notified. PIV consult placed by RN.

## 2023-09-30 NOTE — Consult Note (Addendum)
 Advanced Heart Failure Team Consult Note   Primary Physician: Kathleen Lime, MD Cardiologist:  Arvilla Meres, MD  Reason for Consultation: Acute RV Failure HPI:    Kim Anthony is seen today for evaluation of acute RV failure at the request of Dr. Annie Paras.   Kim Anthony is a 73 y.o. female with a history of mechanical MVR in 2001 in [place], [place], CAD, hx breast cancer s/p left mastectomy and radiation/chemo, tobacco use, COPD, prior PE/DVT, HTN, chronic hypoxic respiratory failure (on home O2) and systolic RV failure.    EF in 2017: EF 45-50%. EF improved to 55-60% in July 2020.   She was admitted 7/22 with A/C CHF and COPD exacerbation. Echo with EF of 35-40%. Mildly enlarged RV.   R/LHC showed 1v CAD with occluded mRCA and stable hemodynamics.   Echo 2/23: EF 50-55%, RV moderately down.   S/p DCCV from AFL to NSR in 2023. Started on amiodarone. (Intermittently stopped due to prolonged Qtc)     4/24 had mechanical fall and fractured rt femur, treated w/ IM nailing.    Echo 2/25: EF 50%, mild LVH, RV severely reduced, normal MV prosthesis, moderate to severe TR. RHC 2/25 (on NE 5): RA 22, PA 50/25 (35), PCWP 15, CO/IC (TD) 4.2/2.7, PVR 4.8, PAPi 1.1   Two recent admissions: On 08/17/23 for melena with hypotension 60/40s. She had supra therapeutic INR s/p Kcentra. Required week of Milrinone for RV support. EDG showed gastritis without active bleed. Discharged on 3/19 and resumed warfarin. Re-admitted 09/19/23 for RV failure and volume overload (of note, INR was 7.2) and was diuresed, discharged on 09/22/23 with Torsemide 120 mg daily.   Presented again 09/29/23 with RV failure and volume overload. Labs notable for K 3.5, BUN/Cr 25/1.91, ALP 129, uric acid 16.1, BNP 723, INR 1.4., TSH 6.4, T4 1.32. EKG in NSR. CXR with pulm congestion and distended loops of bowel noted. Has been started on IV diuresis with poor response. Advanced Heart Failure consulted for further management of RV  failure.   Home Medications Prior to Admission medications   Medication Sig Start Date End Date Taking? Authorizing Provider  albuterol (VENTOLIN HFA) 108 (90 Base) MCG/ACT inhaler Inhale 1 puff into the lungs every 4 (four) hours as needed for wheezing or shortness of breath. 09/14/23  Yes Juberg, Christopher, DO  FARXIGA 10 MG TABS tablet Take 1 tablet by mouth once daily 08/31/23  Yes Kathleen Lime, MD  fluticasone (FLONASE) 50 MCG/ACT nasal spray Place 2 sprays into both nostrils daily. Patient taking differently: Place 1 spray into both nostrils daily. 05/17/23 05/16/24 Yes Kathleen Lime, MD  Fluticasone-Umeclidin-Vilant (TRELEGY ELLIPTA) 100-62.5-25 MCG/ACT AEPB Inhale 1 puff into the lungs daily. 09/14/23  Yes Juberg, Cristal Deer, DO  gabapentin (NEURONTIN) 800 MG tablet Take 1 tablet (800 mg total) by mouth 3 (three) times daily. 09/15/23  Yes Kathleen Lime, MD  HYDROcodone-acetaminophen (NORCO/VICODIN) 5-325 MG tablet Take 1 tablet by mouth every 4 (four) hours as needed for moderate pain (pain score 4-6). Diagnosis- chronic low back pain 09/17/23  Yes Lovorn, Aundra Millet, MD  hydrOXYzine (ATARAX) 10 MG tablet Take 1 tablet (10 mg total) by mouth 3 (three) times daily as needed for anxiety. 09/22/23  Yes Kathleen Lime, MD  losartan (COZAAR) 25 MG tablet Take 0.5 tablets (12.5 mg total) by mouth daily. 08/10/23 11/08/23 Yes Milford, Anderson Malta, FNP  metoprolol succinate (TOPROL-XL) 25 MG 24 hr tablet Take 1 tablet (25 mg total) by mouth 2 (two) times  daily. 08/01/23  Yes Monna Fam, MD  potassium chloride (KLOR-CON M) 10 MEQ tablet Take 2 tablets (20 mEq total) by mouth daily. 09/07/23 04/04/24 Yes Kathleen Lime, MD  simvastatin (ZOCOR) 20 MG tablet Take 1 tablet (20 mg total) by mouth daily. 12/03/22  Yes Willette Cluster, MD  torsemide (DEMADEX) 20 MG tablet Take 6 tablets (120 mg total) by mouth daily. 09/22/23  Yes Kathleen Lime, MD  warfarin (COUMADIN) 5 MG tablet Take 2.5mg  (half tablet) daily except  on Saturday, take 5mg  (1 tablet). Get an INR check by Monday 09/27/23 09/22/23  Yes Ginnie Smart, MD  tetrahydrozoline 0.05 % ophthalmic solution Place 1 drop into both eyes 3 (three) times daily. Patient not taking: Reported on 09/20/2023    [provider]    Past Medical History: Past Medical History:  Diagnosis Date   Acute delirium 08/30/2023   Acute GI bleeding 08/18/2023   Arthritis    Asthma    Breast cancer (HCC) 2001   Left Breast Cancer   CHF (congestive heart failure) (HCC)    COPD (chronic obstructive pulmonary disease) (HCC)    Coronary artery disease    GERD (gastroesophageal reflux disease)    History of mitral valve replacement with mechanical valve    HLD (hyperlipidemia)    Hypertension    Pulmonary embolism (HCC) 2021   Past Surgical History: Past Surgical History:  Procedure Laterality Date   APPLICATION OF A-CELL OF CHEST/ABDOMEN Left 01/27/2019   Procedure: APPLICATION OF A-CELL OF CHEST;  Surgeon: Peggye Form, DO;  Location: [facility] OR;  Service: Plastics;  Laterality: Left;   APPLICATION OF WOUND VAC N/A 01/27/2019   Procedure: APPLICATION OF WOUND VAC;  Surgeon: Peggye Form, DO;  Location: [facility] OR;  Service: Plastics;  Laterality: N/A;   BREAST SURGERY Left 2001   for CA   CARDIOVERSION N/A 02/26/2022   Procedure: CARDIOVERSION;  Surgeon: Little Ishikawa, MD;  Location[facility] ENDOSCOPY;  Service: Cardiovascular;  Laterality: N/A;   COLONOSCOPY WITH PROPOFOL N/A 06/07/2018   Procedure: COLONOSCOPY WITH PROPOFOL;  Surgeon: Charlott Rakes, MD;  Location: [facility] ENDOSCOPY;  Service: Endoscopy;  Laterality: N/A;   DEBRIDEMENT AND CLOSURE WOUND Left 12/28/2018   Procedure: Debridement And Closure Wound;  Surgeon: Abigail Miyamoto, MD;  Location: [facility] OR;  Service: General;  Laterality: Left;   ESOPHAGOGASTRODUODENOSCOPY (EGD) WITH PROPOFOL N/A 08/24/2023   Procedure: ESOPHAGOGASTRODUODENOSCOPY (EGD) WITH PROPOFOL;  Surgeon: Lynann Bologna, DO;  Location: [facility] ENDOSCOPY;  Service: Gastroenterology;  Laterality: N/A;   INCISION AND DRAINAGE OF WOUND Left 01/27/2019   Procedure: IRRIGATION AND DEBRIDEMENT OF CHEST WALL;  Surgeon: Peggye Form, DO;  Location: [facility] OR;  Service: Plastics;  Laterality: Left;   INTRAMEDULLARY (IM) NAIL INTERTROCHANTERIC N/A 09/23/2022   Procedure: INTRAMEDULLARY (IM) NAIL INTERTROCHANTERIC;  Surgeon: Tarry Kos, MD;  Location: [facility] OR;  Service: Orthopedics;  Laterality: N/A;   KNEE ARTHROSCOPY WITH MEDIAL MENISECTOMY Left 08/06/2021   Procedure: KNEE ARTHROSCOPY WITH PARTIAL MEDIAL MENISECTOMY, DEBRIDEMENT;  Surgeon: Jene Every, MD;  Location: [facility] ORS;  Service: Orthopedics;  Laterality: Left;  1 HR   LATISSIMUS FLAP TO BREAST Left 01/18/2019   Procedure: LEFT LATISSIMUS FLAP TO BREAST;  Surgeon: Peggye Form, DO;  Location: [facility] OR;  Service: Plastics;  Laterality: Left;   MASTECTOMY Left    MITRAL VALVE REPLACEMENT     mechanical   POLYPECTOMY  06/07/2018   Procedure: POLYPECTOMY;  Surgeon: Charlott Rakes, MD;  Location: [facility] ENDOSCOPY;  Service: Endoscopy;;   RIGHT HEART CATH N/A 08/20/2023   Procedure: RIGHT HEART CATH;  Surgeon: Dolores Patty, MD;  Location: [facility] INVASIVE CV LAB;  Service: Cardiovascular;  Laterality: N/A;   RIGHT/LEFT HEART CATH AND CORONARY ANGIOGRAPHY N/A 01/09/2021   Procedure: RIGHT/LEFT HEART CATH AND CORONARY ANGIOGRAPHY;  Surgeon: Dolores Patty, MD;  Location: [facility] INVASIVE CV LAB;  Service: Cardiovascular;  Laterality: N/A;   WOUND DEBRIDEMENT Left 12/28/2018   Procedure: EXCISION OF LEFT CHRONIC CHEST WALL WOUND;  Surgeon: Abigail Miyamoto, MD;  Location: [facility] OR;  Service: General;  Laterality: Left;   Family History: Family History  Problem Relation Age of Onset   Hypertension Mother    Cancer Father    Hypertension Father    Breast cancer Maternal Aunt 50   Heart attack Other    Social History: Social History   Socioeconomic  History   Marital status: Single    Spouse name: Not on file   Number of children: Not on file   Years of education: Not on file   Highest education level: Not on file  Occupational History   Not on file  Tobacco Use   Smoking status: Every Day    Current packs/day: 0.10    Average packs/day: 0.1 packs/day for 30.0 years (3.0 ttl pk-yrs)    Types: Cigarettes   Smokeless tobacco: Never   Tobacco comments:    5 cigs per day  Vaping Use   Vaping status: Never Used  Substance and Sexual Activity   Alcohol use: Yes    Alcohol/week: 3.0 standard drinks of alcohol    Types: 3 Standard drinks or equivalent per week    Comment: beer- ocasional   Drug use: No   Sexual activity: Not Currently    Birth control/protection: None  Other Topics Concern   Not on file  Social History Narrative   ** Merged History Encounter **       Social Drivers of Health   Financial Resource Strain: Low Risk  (12/01/2022)   Overall Financial Resource Strain (CARDIA)    Difficulty of Paying Living Expenses: Not very hard  Food Insecurity: No Food Insecurity (09/30/2023)   Hunger Vital Sign    Worried About Running Out of Food in the Last Year: Never true    Ran Out of Food in the Last Year: Never true  Transportation Needs: No Transportation Needs (09/30/2023)   PRAPARE - Administrator, Civil Service (Medical): No    Lack of Transportation (Non-Medical): No  Physical Activity: Inactive (12/01/2022)   Exercise Vital Sign    Days of Exercise per Week: 0 days    Minutes of Exercise per Session: 10 min  Stress: Stress Concern Present (12/01/2022)   [employer] of Occupational Health - Occupational Stress Questionnaire    Feeling of Stress : To some extent  Social Connections: Unknown (09/30/2023)   Social Connection and Isolation Panel [NHANES]    Frequency of Communication with Friends and Family: More than three times a week    Frequency of Social Gatherings with Friends and Family:  More than three times a week    Attends Religious Services: Patient declined    Database administrator or Organizations: Patient declined    Attends Banker Meetings: Patient declined    Marital Status: Patient declined   Allergies:  Allergies  Allergen Reactions   Lisinopril Swelling   Acetaminophen-Codeine Itching   Propoxyphene Itching    Darvocet   Tape  Rash    Plastic   Tramadol Itching, Nausea And Vomiting and Rash   Objective:    Vital Signs:   Temp:  [98 F (36.7 C)-98.9 F (37.2 C)] 98 F (36.7 C) (04/10 0817) Pulse Rate:  [65-80] 80 (04/10 0817) Resp:  [13-28] 16 (04/10 0817) BP: (83-115)/(58-92) 104/58 (04/10 0817) SpO2:  [91 %-100 %] 95 % (04/10 0817) Weight:  [77.4 kg-77.6 kg] 77.6 kg (04/10 0817) Last BM Date : 09/29/23  Weight change: Filed Weights   09/30/23 0621 09/30/23 0817  Weight: 77.4 kg 77.6 kg    Intake/Output:  No intake or output data in the 24 hours ending 09/30/23 1147   Physical Exam    General: Chronically ill appearing. No distress on [place] [facility]: Difficult to assess with R goiter. S1 and S2 present. No murmurs or rub. Extremities: Warm and dry.  2+ BLE edema.  Neuro: Alert and oriented x3. Affect pleasant. Moves all extremities without difficulty.  Telemetry   N/A  EKG    NSR 75 bpm  Labs   Basic Metabolic Panel: Recent Labs  Lab 09/30/23 0005  NA 135  K 3.5  CL 100  CO2 25  GLUCOSE 77  BUN 25*  CREATININE 1.91*  CALCIUM 8.9  MG 2.0   Liver Function Tests: Recent Labs  Lab 09/30/23 0005  AST 17  ALT 13  ALKPHOS 129*  BILITOT 1.3*  PROT 6.8  ALBUMIN 2.9*   CBC: Recent Labs  Lab 09/30/23 0005  WBC 7.6  HGB 9.5*  HCT 31.9*  MCV 93.5  PLT 337   BNP: BNP (last 3 results) Recent Labs    08/28/23 0414 09/19/23 2151 09/30/23 0005  BNP 589.0* 900.4* 723.7*   Coagulation Studies: Recent Labs    09/30/23 0005  LABPROT 17.6*  INR 1.4*    Imaging   DG Chest 2 View Result  Date: 09/29/2023 CLINICAL DATA:  Edema, short of breath EXAM: CHEST - 2 VIEW COMPARISON:  None Available. FINDINGS: Sternotomy wires overlies stable enlarged cardiac silhouette. There is mild patchy airspace disease. There is pleural calcifications in the LEFT upper lobe. Low lung volumes. IMPRESSION: Cardiomegaly and mild pulmonary edema pattern. Pleural calcifications. Electronically Signed   By: Genevive Bi M.D.   On: 09/29/2023 21:32   Medications:    Current Medications:  budeson-glycopyrrolate-formoterol  2 puff Inhalation BID   colchicine  0.3 mg Oral Once   gabapentin  200 mg Oral TID   nicotine  7 mg Transdermal Daily   potassium chloride  40 mEq Oral BID   simvastatin  20 mg Oral q1800   warfarin  5 mg Oral ONCE-1600   Warfarin - Pharmacist Dosing Inpatient   Does not apply q1600    Infusions:  furosemide 160 mg (09/30/23 1024)   heparin 1,400 Units/hr (09/30/23 0944)    Patient Profile   Kim Anthony is a 73 y.o. female with a history of mechanical MVR in 2001 in [place], [place], CAD, hx breast cancer s/p left mastectomy and radiation/chemo, tobacco use, COPD, prior PE/DVT, HTN, chronic hypoxic respiratory failure (on home O2) and systolic RV failure.   Assessment/Plan   1. Acute on chronic RV Failure Pulmonary HTN  - Suspect likely underlying OSA contributing to PH/RV failure. Also w/ known COPD. - Echo this admit EF 55% and D-shaped septum, RV-RA gradient 25 mmHg and severe TR (does not coapt) - NYHA III-IV on admit - On Lasix 160 mg IV. Minimal UOP per patient. No I/Os. Likely  needs RV support. - Place PICC. Start Milrinone 0.25 mcg/kg/min - UNNA boots - Resume Farxiga 10 mg daily.   - GDMT limited by AKI and soft BP   2. AKI: baseline sCr 0.8 - in the setting of RV congestion/low output - on admit 1.91, down from last admission 2.64. - on SGLT2i   3. PAF - She does not do well in AFL/AF. - Recent admit 2/25 with AFL, converted to NSR on amio, but then  stopped d/t long QT - On Warfarin. INR 1.4 today. Dosing discussed with PharmD - Not on telemetry. Start continuous monitoring.   4. CAD  - 1 V CAD with occluded mid RCA on cath (7/22) - No chest pain.  - continue zocor 20 mg daily   5. Anemia  - Hgb 9.5 today. Received IV Fe last admission.     6. Hx of mechanical mitral valve replacement: - Mechanical MVR in [place] in 2001. - Stable on echo 2/25 MV gradient 3 - INR 1.4 today. Warfarin dosing d/w PharmD   7. Tricuspid valve regurgitation - echo 3/25 with severe TR and tricuspid leaflet malcoaptation   8. Acute Gout - Uric acid 16 today. - pred 20 mg x3 days.  - colchicine prev discontinued due to diarrhea, likely can add back  9. Mild Hyperthyroid with Goiter - TSH 6, T4 1.32 - consider endocrine consult   Will transfer patient to a cardiac progressive unit for further management.    Length of Stay: 0  [place] Lee, NP  09/30/2023, 11:47 AM  Advanced Heart Failure Team Pager [phone] (M-F; 7a - 5p)  Please contact [facility] for night-coverage after hours (4p -7a ) and weekends on [email or web address]  Patient seen with NP, I formulated the plan and agree with the above note.   Patient has extensive history as noted above.  She has prominent RV dysfunction. Recent admission 3/25 for volume overload, discharged on torsemide 120 mg daily.   Most recent echo in 3/25 showed EF 55-60% with D-shaped septum, severe RV dilation, severe RV dysfunction, mechanical MV with mean gradient 3 mmHg, severe TR.   Patient readmitted with volume overload/RV failure. Poor response so far to diuretics.   General: NAD Neck: JVP 14-16 cm, no thyromegaly or thyroid nodule.  Lungs: Clear to auscultation bilaterally with normal respiratory effort. CV: Nondisplaced PMI.  Heart regular S1/S2 with mechanical S1, no S3/S4, no murmur.  1+ edema to knees.  No carotid bruit.  Normal pedal pulses.  Abdomen: Soft, nontender, no hepatosplenomegaly, no  distention.  Skin: Intact without lesions or rashes.  Neurologic: Alert and oriented x 3.  Psych: Normal affect. Extremities: No clubbing or cyanosis.  HEENT: Normal.   1. Acute on chronic diastolic CHF with prominent RV dysfunction: COPD, OHS/OSA, and diastolic CHF from valvular disease likely play a role in the RV failure.  Last echo with EF 55-60% with D-shaped septum, severe RV dilation, severe RV dysfunction, mechanical MV with mean gradient 3 mmHg, severe TR. She has had multiple recent admissions.  She is markedly volume overloaded on exam, has required milrinone in the past for RV support while diuresing.  Suspect end stage RV failure.  - Place PICC to follow CVP and co-ox, move to Progressive unit.  - Start milrinone 0.25 mcg/kg/min.  - Lasix gtt 20 mg/hr, will give metolazone 2.5 x 1.  - Unna boots.  - Restart Farxiga 10 mg daily.  2. Mechanical mitral valve: Stable on 3/25 echo.  INR 1.4.  -  Needs heparin gtt while INR subtherapeutic.  - Warfarin for goal INR 2.5-3.5.  3. Gout: Uric acid 16.  - Prednisone burst 20 mg daily x 3 days.  - Can try colchicine again.  4. CAD: CTO RCA on 7/22 cath.  - Continue statin.  5. Atrial fibrillation/flutter: Paroxysmal.  Has been in NSR here.  - heparin gtt.  6. CKD stage 3: Creatinine has been trending down.  1.9 today.  - Farxiga.  - Follow closely.  7. Tricuspid regurgitation: Severe in setting of RV failure.   Marca Ancona 09/30/2023 2:30 PM

## 2023-09-30 NOTE — Evaluation (Signed)
 Physical Therapy Evaluation Patient Details Name: Kim Anthony [name]: [id number] DOB: 25-Sep-1950 Today's Date: 09/30/2023  History of Present Illness  Pt is a 73 y/o F presenting to ED on 4/9 with weight gain and SOB, LE edema. Admitted for CHF exacerbation. Doppler negative for DVT. PMH includes A fib, MVR of warfarin, COPD, CKD IIIB, CHF with severely reduced RV EF, prediabetes, breast CA  Clinical Impression  Pt admitted with above. Pt presents with decreased functional mobility secondary to pain, decreased cardiopulmonary endurance, impaired standing balance. Pt requiring min assist for transfers and ambulating 30 ft with a Rollator. Suspect steady progress. Recommend HHPT to address deficits.  Pre mobility: 73 HR, SpO2 97% on 2L O2, BP 104/74 (84) Post mobility: 80 HR, SpO2 96% on 2L O2, BP 108/83 (92)      If plan is discharge home, recommend the following: Assistance with cooking/housework;Assist for transportation;Help with stairs or ramp for entrance   Can travel by private vehicle   Yes    Equipment Recommendations None recommended by PT  Recommendations for Other Services       Functional Status Assessment Patient has had a recent decline in their functional status and demonstrates the ability to make significant improvements in function in a reasonable and predictable amount of time.     Precautions / Restrictions Precautions Precautions: Fall Precaution/Restrictions Comments: watch O2 Restrictions Weight Bearing Restrictions Per Provider Order: No      Mobility  Bed Mobility               General bed mobility comments: OOB in chair    Transfers Overall transfer level: Needs assistance Equipment used: Rollator (4 wheels) Transfers: Sit to/from Stand Sit to Stand: Min assist           General transfer comment: Cues for locking Rollator prior to transferring, minA to stand    Ambulation/Gait Ambulation/Gait assistance: Contact guard assist Gait  Distance (Feet): 30 Feet Assistive device: Rollator (4 wheels) Gait Pattern/deviations: Step-through pattern, Decreased stride length Gait velocity: decreased Gait velocity interpretation: <1.8 ft/sec, indicate of risk for recurrent falls   General Gait Details: slow and steady pace  Careers information officer     Tilt Bed    Modified Rankin (Stroke Patients Only)       Balance Overall balance assessment: Needs assistance Sitting-balance support: Feet supported, No upper extremity supported Sitting balance-Leahy Scale: Good     Standing balance support: During functional activity Standing balance-Leahy Scale: Fair                               Pertinent Vitals/Pain Pain Assessment Pain Assessment: Faces Faces Pain Scale: Hurts little more Pain Location: bilateral hands Pain Descriptors / Indicators: Discomfort Pain Intervention(s): Monitored during session    Home Living Family/patient expects to be discharged to:: Private residence Living Arrangements: Children (daughter) Available Help at Discharge: Family;Available PRN/intermittently Type of Home: House Home Access: Stairs to enter Entrance Stairs-Rails: Right;Left;Can reach both Entrance Stairs-Number of Steps: 8   Home Layout: One level Home Equipment: Rollator (4 wheels);Rolling Walker (2 wheels);Cane - single point;BSC/3in1;Shower seat;Grab bars - tub/shower Additional Comments: does not wear O2 at home    Prior Function Prior Level of Function : Needs assist             Mobility Comments: ind; furniture walks ADLs Comments: daughter assists with  meds  Extremity/Trunk Assessment   Upper Extremity Assessment Upper Extremity Assessment: Defer to OT evaluation    Lower Extremity Assessment Lower Extremity Assessment: Overall WFL for tasks assessed    Cervical / Trunk Assessment Cervical / Trunk Assessment: Kyphotic  Communication    Communication Communication: No apparent difficulties    Cognition Arousal: Alert Behavior During Therapy: WFL for tasks assessed/performed   PT - Cognitive impairments: Memory, Safety/Judgement                         Following commands: Intact       Cueing Cueing Techniques: Verbal cues, Tactile cues     General Comments General comments (skin integrity, edema, etc.): SpO2 down to high 80s on RA, remained in 90s on 1L O2 with wave pleth varying. reading well at end of session on 2L O2    Exercises     Assessment/Plan    PT Assessment Patient needs continued PT services  PT Problem List Decreased strength;Decreased range of motion;Decreased activity tolerance;Decreased balance;Decreased mobility;Decreased coordination;Decreased cognition;Decreased knowledge of use of DME;Decreased safety awareness;Decreased knowledge of precautions;Cardiopulmonary status limiting activity       PT Treatment Interventions DME instruction;Gait training;Stair training;Functional mobility training;Therapeutic activities;Therapeutic exercise;Balance training;Neuromuscular re-education;Cognitive remediation;Patient/family education    PT Goals (Current goals can be found in the Care Plan section)  Acute Rehab PT Goals Patient Stated Goal: less pain PT Goal Formulation: With patient Time For Goal Achievement: 10/14/23 Potential to Achieve Goals: Good    Frequency Min 2X/week     Co-evaluation               AM-PAC PT "6 Clicks" Mobility  Outcome Measure Help needed turning from your back to your side while in a flat bed without using bedrails?: A Little Help needed moving from lying on your back to sitting on the side of a flat bed without using bedrails?: A Little Help needed moving to and from a bed to a chair (including a wheelchair)?: A Little Help needed standing up from a chair using your arms (e.g., wheelchair or bedside chair)?: A Little Help needed to walk in  hospital room?: A Little Help needed climbing 3-5 steps with a railing? : A Little 6 Click Score: 18    End of Session Equipment Utilized During Treatment: Oxygen;Gait belt Activity Tolerance: Patient tolerated treatment well Patient left: in chair;with call bell/phone within reach   PT Visit Diagnosis: Unsteadiness on feet (R26.81);Other abnormalities of gait and mobility (R26.89);Muscle weakness (generalized) (M62.81)    Time: 3086-5784 PT Time Calculation (min) (ACUTE ONLY): 18 min   Charges:   PT Evaluation $PT Eval Low Complexity: 1 Low   PT General Charges $$ ACUTE PT VISIT: 1 Visit         Lillia Pauls, PT, DPT Acute Rehabilitation Services Office [phone]   Norval Morton 09/30/2023, 4:58 PM

## 2023-09-30 NOTE — Progress Notes (Signed)
 PHARMACY - ANTICOAGULATION CONSULT NOTE  Pharmacy Consult for Heparin and Coumadin Indication:  MVR  Allergies  Allergen Reactions   Lisinopril Swelling   Acetaminophen-Codeine Itching   Propoxyphene Itching    Darvocet   Tape Rash    Plastic   Tramadol Itching, Nausea And Vomiting and Rash    Patient Measurements: Height: 5\' 5"  (165.1 cm) Weight: 79 kg (174 lb 3.2 oz) IBW/kg (Calculated) : 57 HEPARIN DW (KG): 73.1  Vital Signs: Temp: 98.2 F (36.8 C) (04/10 2108) Temp Source: Oral (04/10 2108) BP: 123/88 (04/10 1930) Pulse Rate: 103 (04/10 1930)  Labs: Recent Labs    09/30/23 0005 09/30/23 2109  HGB 9.5*  --   HCT 31.9*  --   PLT 337  --   LABPROT 17.6*  --   INR 1.4*  --   HEPARINUNFRC  --  0.16*  CREATININE 1.91*  --     Estimated Creatinine Clearance: 27.2 mL/min (A) (by C-G formula based on SCr of 1.91 mg/dL (H)).  Assessment: Pt with MVR who was readmitted for CHF exacerbation. INR 1.4 on admission so we will bridge with heparin. Coumadin continued. Hemoglobin 9.5, platelet count 337.  PTA Coumadin regimen: 2.5 mg daily except 5 mg on Saturdays.  Initial heparin level is subtherapeutic (0.16) on 1400 units/hr. RN reports no known infusion issues.   Goal of Therapy:  Heparin level 0.3-0.7 units/ml INR 2.5-3.5 Monitor platelets by anticoagulation protocol: Yes   Plan:  Increase heparin drip to 1650 units/hr Coumadin 5 mg x 1 given today. Next heparin level in am, with PT/INR and CBC.  Dennie Fetters, RPh 09/30/2023,10:10 PM

## 2023-09-30 NOTE — Progress Notes (Signed)
 Lower extremity venous duplex completed. Please see CV Procedures for preliminary results.  Shona Simpson, RVT 09/30/23 2:16 PM

## 2023-09-30 NOTE — TOC Initial Note (Signed)
 Transition of Care [facility]) - Initial/Assessment Note    Patient Details  Name: Kim Anthony [name]: [id number] Date of Birth: 10/11/1950  [facility] CM/SW Contact:    Harriet Masson, RN Phone Number: 09/30/2023, 10:15 AM  Clinical Narrative:                 Spoke to patient regarding transition needs.  Patient lives with daughter and is active with [facility] for PT, OT.  Will need resumption home health orders. Address, Phone number and PCP verified. Patient has walker, cane, BSC. TOC will continue to follow for needs.  Expected Discharge Plan: Home w Home Health Services Barriers to Discharge: Continued Medical Work up   Patient Goals and CMS Choice Patient states their goals for this hospitalization and ongoing recovery are:: retirn home CMS Medicare.gov Compare Post Acute Care list provided to:: Patient Choice offered to / list presented to : Patient      Expected Discharge Plan and Services       Living arrangements for the past 2 months: Skilled Nursing Facility                           HH Arranged: PT, OT [facility] Agency: [facility] Date [facility] Agency Contacted: 09/30/23 Time HH Agency Contacted: 1015 Representative spoke with at [facility] Agency: Brandi  Prior Living Arrangements/Services Living arrangements for the past 2 months: Skilled Nursing Facility Lives with:: Adult Children   Do you feel safe going back to the place where you live?: Yes      Need for Family Participation in Patient Care: Yes (Comment) Care giver support system in place?: Yes (comment)   Criminal Activity/Legal Involvement Pertinent to Current Situation/Hospitalization: No - Comment as needed  Activities of Daily Living   ADL Screening (condition at time of admission) Independently performs ADLs?: Yes (appropriate for developmental age) Is the patient deaf or have difficulty hearing?: No Does the patient have difficulty seeing, even when wearing glasses/contacts?:  No Does the patient have difficulty concentrating, remembering, or making decisions?: No  Permission Sought/Granted                  Emotional Assessment Appearance:: Appears stated age Attitude/Demeanor/Rapport: Gracious Affect (typically observed): Accepting Orientation: : Oriented to Place, Oriented to Situation, Oriented to  Time, Oriented to Self Alcohol / Substance Use: Not Applicable Psych Involvement: No (comment)  Admission diagnosis:  Acute exacerbation of CHF (congestive heart failure) (HCC) [I50.9] Acute on chronic congestive heart failure, unspecified heart failure type [facility]) [I50.9] Patient Active Problem List   Diagnosis Date Noted   Acute exacerbation of CHF (congestive heart failure) (HCC) 09/30/2023   Stage 3b chronic kidney disease (HCC) 09/21/2023   Supratherapeutic INR 09/20/2023   Acute combined systolic and diastolic heart failure (HCC) 09/19/2023   Gastrointestinal hemorrhage with melena 09/07/2023   DVT (deep venous thrombosis) (HCC) 09/02/2023   AKI (acute kidney injury) (HCC) 09/01/2023   RVF (right ventricular failure) (HCC) 09/01/2023   Pulmonary hypertension, low resistance (HCC) 08/20/2023   QT prolongation 08/18/2023   History of mitral valve prosthesis 07/31/2023   Atrial flutter (HCC) 07/31/2023   Right ventricular failure (HCC) 07/31/2023   Acute hypoxic respiratory failure (HCC) 07/30/2023   CKD stage 3a, GFR 45-59 ml/min (HCC) 07/30/2023   Gout 07/30/2023   Postherpetic neuralgia 07/30/2023   Atrial fibrillation with RVR (HCC) 07/29/2023   Arthritis of right wrist due to gout 07/23/2023  Wrist arthritis 07/23/2023   Asymmetrical sensorineural hearing loss 01/27/2023   Bilateral hearing loss 12/01/2022   Hypokalemia 11/18/2022   Thrombocytosis 10/16/2022   Hyperkalemia 10/08/2022   Alteration in anticoagulation 09/25/2022   History of mitral valve replacement with mechanical valve 09/24/2022   Warfarin anticoagulation 09/24/2022    Closed fracture of right hip (HCC) 09/23/2022   Closed intertrochanteric fracture of right femur, initial encounter (HCC) 09/22/2022   H/O mitral valve replacement with mechanical valve 09/22/2022   Endometrial thickening on ultrasound 07/07/2022   Gastroesophageal reflux disease without esophagitis 06/29/2022   Chronic pain of both knees 06/29/2022   Vaginal bleeding 04/01/2022   A-fib (HCC) 03/13/2022   Facial edema 03/07/2022   Atypical atrial flutter (HCC)    Acute pulmonary edema (HCC)    Edema of both lower extremities 01/13/2022   Subacute cough 11/28/2021   COPD exacerbation (HCC) 11/05/2021   Conjunctival hemorrhage of left eye 06/25/2021   Cellulitis of right knee 05/12/2021   Osteoarthritis of knees, bilateral 05/12/2021   Osteoarthritis of left knee 04/24/2021   Bunion 03/18/2021   Inflammatory polyarthropathy (HCC) 03/04/2021   Seronegative inflammatory arthritis 02/17/2021   High risk medication use 02/17/2021   Elevated transaminase level 01/27/2021   Pain in right hand 01/16/2021   Acute idiopathic gout of multiple sites 09/30/2020   Pain in joint of left knee 09/27/2020   Right wrist pain 05/29/2020   Ganglion of right wrist 05/29/2020   Chronic pain syndrome 01/17/2020   Monoarthritis of wrist 10/18/2019   Ganglion cyst 09/21/2019   Hip pain 08/23/2019   Thoracic ascending aortic aneurysm (HCC) 07/05/2019   Macrocytic anemia 02/16/2019   Breast cancer (HCC)    Acquired absence of left breast 12/20/2018   COPD (chronic obstructive pulmonary disease) (HCC) 11/25/2017   S/P mitral valve replacement    Chronic anticoagulation    Tobacco use disorder 02/25/2017   Lymphadenopathy, mediastinal 02/25/2017   Essential hypertension 02/01/2016   Allergic rhinitis 02/01/2016   Healthcare maintenance 01/30/2016   Chronic low back pain 07/22/2015   Pulmonary embolism (HCC) 07/11/2015   Headache, common migraine 07/11/2015   HLD (hyperlipidemia) 07/11/2015    Combined systolic and diastolic congestive heart failure (HCC) 07/11/2015   PCP:  Kathleen Lime, MD Pharmacy:   [facility] [address] [place], [place] - [address] [place] [address] Phone: [phone] Fax: [phone]     Social Drivers of Health (SDOH) Social History: SDOH Screenings   Food Insecurity: No Food Insecurity (09/30/2023)  Housing: Low Risk  (09/30/2023)  Transportation Needs: No Transportation Needs (09/30/2023)  Utilities: Not At Risk (09/30/2023)  Alcohol Screen: Low Risk  (12/01/2022)  Depression (PHQ2-9): Low Risk  (12/01/2022)  Financial Resource Strain: Low Risk  (12/01/2022)  Physical Activity: Inactive (12/01/2022)  Social Connections: Unknown (09/30/2023)  Stress: Stress Concern Present (12/01/2022)  Tobacco Use: High Risk (09/29/2023)   SDOH Interventions:     Readmission Risk Interventions    09/22/2023    2:20 PM 02/19/2022    3:36 PM  Readmission Risk Prevention Plan  Transportation Screening Complete Complete  PCP or Specialist Appt within 3-5 Days  Complete  HRI or Home Care Consult  Complete  Social Work Consult for Recovery Care Planning/Counseling  Complete  Palliative Care Screening  Not Applicable  Medication Review Oceanographer) Complete Complete  HRI or Home Care Consult Complete   Palliative Care Screening Not Applicable   Skilled Nursing Facility Not Applicable

## 2023-09-30 NOTE — Evaluation (Signed)
 Occupational Therapy Evaluation Patient Details Name: Kim Anthony [name]: [id number] DOB: 02-16-1951 Today's Date: 09/30/2023   History of Present Illness   Pt is a 73 y/o F presenting to ED on 4/9 with weight gain and SOB, LE edema. Admitted for CHF exacerbation. Doppler negative for DVT. PMH includes A fib, MVR of warfarin, COPD, CKD IIIB, CHF with severely reduced RV EF, prediabetes, breast CA     Clinical Impressions Pt reports ind at baseline with mobility (occasionally furniture walks), daughter assists with meds. Pt currently needing up to mod A for ADLs, supervision for bed mobility and min A for transfers with 1 person HHA. SpO2 down to mid 80s on RA, incr to 90s on 1-2L O2. Pt presenting with impairments listed below, will follow acutely. Recommend HHOT at d/c.      If plan is discharge home, recommend the following:   A little help with walking and/or transfers;A lot of help with bathing/dressing/bathroom;Assistance with cooking/housework;Direct supervision/assist for medications management;Direct supervision/assist for financial management;Assist for transportation;Help with stairs or ramp for entrance;Supervision due to cognitive status     Functional Status Assessment   Patient has had a recent decline in their functional status and demonstrates the ability to make significant improvements in function in a reasonable and predictable amount of time.     Equipment Recommendations   None recommended by OT     Recommendations for Other Services   PT consult     Precautions/Restrictions   Precautions Precautions: Fall Precaution/Restrictions Comments: watch O2 Restrictions Weight Bearing Restrictions Per Provider Order: No     Mobility Bed Mobility Overal bed mobility: Needs Assistance Bed Mobility: Supine to Sit     Supine to sit: Supervision          Transfers Overall transfer level: Needs assistance Equipment used: 1 person hand held  assist Transfers: Sit to/from Stand Sit to Stand: Min assist                  Balance Overall balance assessment: Needs assistance Sitting-balance support: Feet supported, No upper extremity supported Sitting balance-Leahy Scale: Good Sitting balance - Comments: reaches outside BOS without LOB   Standing balance support: During functional activity Standing balance-Leahy Scale: Fair Standing balance comment: 1 person HHA, incr external asssit needed with fatigue                           ADL either performed or assessed with clinical judgement   ADL Overall ADL's : Needs assistance/impaired Eating/Feeding: Set up;Sitting   Grooming: Set up;Sitting   Upper Body Bathing: Contact guard assist;Sitting   Lower Body Bathing: Moderate assistance;Sitting/lateral leans   Upper Body Dressing : Contact guard assist;Sitting   Lower Body Dressing: Moderate assistance;Sitting/lateral leans   Toilet Transfer: Minimal assistance;Ambulation;Regular Toilet           Functional mobility during ADLs: Minimal assistance       Vision   Vision Assessment?: No apparent visual deficits     Perception Perception: Not tested       Praxis Praxis: Not tested       Pertinent Vitals/Pain Pain Assessment Pain Assessment: Faces Pain Score: 4  Faces Pain Scale: Hurts little more Pain Location: R UE arm/neck Pain Descriptors / Indicators: Discomfort Pain Intervention(s): Limited activity within patient's tolerance, Monitored during session, Repositioned     Extremity/Trunk Assessment Upper Extremity Assessment Upper Extremity Assessment: Generalized weakness   Lower Extremity Assessment Lower Extremity Assessment: Defer  to PT evaluation   Cervical / Trunk Assessment Cervical / Trunk Assessment: Kyphotic   Communication Communication Communication: Impaired   Cognition Arousal: Alert Behavior During Therapy: WFL for tasks assessed/performed Cognition: No  family/caregiver present to determine baseline             OT - Cognition Comments: pt providing home set up info and following commands appropriately during session, recalls fluid restrictions and aware of why she is at the hospital                 Following commands: Impaired Following commands impaired: Follows one step commands inconsistently, Follows one step commands with increased time     Cueing  General Comments   Cueing Techniques: Verbal cues;Tactile cues  SpO2 down to high 80s on RA, remained in 90s on 1L O2 with wave pleth varying. reading well at end of session on 2L O2   Exercises     Shoulder Instructions      Home Living Family/patient expects to be discharged to:: Private residence Living Arrangements: Children (daughter) Available Help at Discharge: Family;Available PRN/intermittently Type of Home: House Home Access: Stairs to enter [employer] of Steps: 8 Entrance Stairs-Rails: Right;Left;Can reach both Home Layout: One level     Bathroom Shower/Tub: Chief Strategy Officer: Handicapped height Bathroom Accessibility: Yes   Home Equipment: Rollator (4 wheels);Rolling Walker (2 wheels);Cane - single point;BSC/3in1;Shower seat;Grab bars - tub/shower   Additional Comments: does not wear O2 at home      Prior Functioning/Environment Prior Level of Function : Needs assist             Mobility Comments: ind; furniture walks ADLs Comments: daughter assists with  meds    OT Problem List: Decreased strength;Decreased range of motion;Decreased activity tolerance;Decreased coordination;Decreased cognition;Decreased safety awareness;Decreased knowledge of precautions;Cardiopulmonary status limiting activity;Impaired balance (sitting and/or standing)   OT Treatment/Interventions: Self-care/ADL training;Therapeutic exercise;Energy conservation;DME and/or AE instruction;Therapeutic activities;Patient/family education;Balance  training      OT Goals(Current goals can be found in the care plan section)   Acute Rehab OT Goals Patient Stated Goal: none stated OT Goal Formulation: With patient Time For Goal Achievement: 10/14/23 Potential to Achieve Goals: Good ADL Goals Pt Will Perform Lower Body Dressing: Independently;sitting/lateral leans;sit to/from stand Pt Will Transfer to Toilet: Independently;ambulating;regular height toilet Pt Will Perform Tub/Shower Transfer: Tub transfer;Shower transfer;Independently;ambulating;shower seat Additional ADL Goal #1: pt will tolerate OOB activity x10 min with SpO2 above 90% in order to improve activity tolerance for ADLs Additional ADL Goal #2: Pt will verbalize x3 energy conservation strategies in prep for ADLs.   OT Frequency:  Min 2X/week    Co-evaluation              AM-PAC OT "6 Clicks" Daily Activity     Outcome Measure Help from another person eating meals?: None Help from another person taking care of personal grooming?: A Little Help from another person toileting, which includes using toliet, bedpan, or urinal?: A Little Help from another person bathing (including washing, rinsing, drying)?: A Lot Help from another person to put on and taking off regular upper body clothing?: A Little Help from another person to put on and taking off regular lower body clothing?: A Lot 6 Click Score: 17   End of Session Equipment Utilized During Treatment: Gait belt;Oxygen (1-2L) Nurse Communication: Mobility status  Activity Tolerance: Patient tolerated treatment well Patient left: in chair;with call bell/phone within reach  OT Visit Diagnosis: Unsteadiness on feet (R26.81);Other  abnormalities of gait and mobility (R26.89);Muscle weakness (generalized) (M62.81);Other symptoms and signs involving cognitive function                Time: 4782-9562 OT Time Calculation (min): 25 min Charges:  OT General Charges $OT Visit: 1 Visit OT Evaluation $OT Eval Moderate  Complexity: 1 Mod OT Treatments $Self Care/Home Management : 8-22 mins  Carver Fila, OTD, OTR/L SecureChat Preferred Acute Rehab [phone]    Dalphine Handing 09/30/2023, 4:33 PM

## 2023-10-01 ENCOUNTER — Inpatient Hospital Stay (HOSPITAL_COMMUNITY)

## 2023-10-01 DIAGNOSIS — I509 Heart failure, unspecified: Secondary | ICD-10-CM | POA: Diagnosis not present

## 2023-10-01 DIAGNOSIS — I5023 Acute on chronic systolic (congestive) heart failure: Secondary | ICD-10-CM

## 2023-10-01 DIAGNOSIS — J449 Chronic obstructive pulmonary disease, unspecified: Secondary | ICD-10-CM | POA: Diagnosis not present

## 2023-10-01 DIAGNOSIS — Z952 Presence of prosthetic heart valve: Secondary | ICD-10-CM | POA: Diagnosis not present

## 2023-10-01 DIAGNOSIS — I110 Hypertensive heart disease with heart failure: Secondary | ICD-10-CM | POA: Diagnosis not present

## 2023-10-01 DIAGNOSIS — I50810 Right heart failure, unspecified: Secondary | ICD-10-CM | POA: Diagnosis not present

## 2023-10-01 HISTORY — PX: IR FLUORO GUIDE CV LINE RIGHT: IMG2283

## 2023-10-01 HISTORY — PX: IR US GUIDE VASC ACCESS RIGHT: IMG2390

## 2023-10-01 LAB — BASIC METABOLIC PANEL WITH GFR
Anion gap: 12 (ref 5–15)
BUN: 31 mg/dL — ABNORMAL HIGH (ref 8–23)
CO2: 23 mmol/L (ref 22–32)
Calcium: 8.2 mg/dL — ABNORMAL LOW (ref 8.9–10.3)
Chloride: 96 mmol/L — ABNORMAL LOW (ref 98–111)
Creatinine, Ser: 2.04 mg/dL — ABNORMAL HIGH (ref 0.44–1.00)
GFR, Estimated: 25 mL/min — ABNORMAL LOW (ref >60)
Glucose, Bld: 155 mg/dL — ABNORMAL HIGH (ref 70–99)
Potassium: 3.7 mmol/L (ref 3.5–5.1)
Sodium: 131 mmol/L — ABNORMAL LOW (ref 135–145)

## 2023-10-01 LAB — CBC
HCT: 29.4 % — ABNORMAL LOW (ref 36.0–46.0)
Hemoglobin: 8.9 g/dL — ABNORMAL LOW (ref 12.0–15.0)
MCH: 27.2 pg (ref 26.0–34.0)
MCHC: 30.3 g/dL (ref 30.0–36.0)
MCV: 89.9 fL (ref 80.0–100.0)
Platelets: 316 10*3/uL (ref 150–400)
RBC: 3.27 MIL/uL — ABNORMAL LOW (ref 3.87–5.11)
RDW: 19.7 % — ABNORMAL HIGH (ref 11.5–15.5)
WBC: 6.1 10*3/uL (ref 4.0–10.5)
nRBC: 0 % (ref 0.0–0.2)

## 2023-10-01 LAB — COOXEMETRY PANEL
Carboxyhemoglobin: 3.1 % — ABNORMAL HIGH (ref 0.5–1.5)
Methemoglobin: <0.7 % (ref 0.0–1.5)
O2 Saturation: 80.7 %
Total hemoglobin: 9.4 g/dL — ABNORMAL LOW (ref 12.0–16.0)

## 2023-10-01 LAB — PROTIME-INR
INR: 1.5 — ABNORMAL HIGH (ref 0.8–1.2)
Prothrombin Time: 18 s — ABNORMAL HIGH (ref 11.4–15.2)

## 2023-10-01 LAB — MAGNESIUM: Magnesium: 2 mg/dL (ref 1.7–2.4)

## 2023-10-01 LAB — HEPARIN LEVEL (UNFRACTIONATED): Heparin Unfractionated: 0.49 [IU]/mL (ref 0.30–0.70)

## 2023-10-01 MED ORDER — LIDOCAINE HCL 1 % IJ SOLN
INTRAMUSCULAR | Status: AC
  Filled 2023-10-01: qty 20

## 2023-10-01 MED ORDER — COLCHICINE 0.6 MG PO TABS
0.6000 mg | ORAL_TABLET | Freq: Once | ORAL | Status: AC
  Administered 2023-10-01: 0.6 mg via ORAL
  Filled 2023-10-01: qty 1

## 2023-10-01 MED ORDER — LIDOCAINE HCL 1 % IJ SOLN
20.0000 mL | Freq: Once | INTRAMUSCULAR | Status: AC
  Administered 2023-10-01: 5 mL

## 2023-10-01 MED ORDER — WARFARIN SODIUM 5 MG PO TABS
5.0000 mg | ORAL_TABLET | Freq: Once | ORAL | Status: AC
  Administered 2023-10-01: 5 mg via ORAL
  Filled 2023-10-01: qty 1

## 2023-10-01 MED ORDER — IOHEXOL 300 MG/ML  SOLN
50.0000 mL | Freq: Once | INTRAMUSCULAR | Status: AC | PRN
  Administered 2023-10-01: 10 mL

## 2023-10-01 MED ORDER — FUROSEMIDE 10 MG/ML IJ SOLN
160.0000 mg | Freq: Two times a day (BID) | INTRAVENOUS | Status: DC
  Administered 2023-10-01 – 2023-10-04 (×8): 160 mg via INTRAVENOUS
  Filled 2023-10-01: qty 10
  Filled 2023-10-01 (×2): qty 16
  Filled 2023-10-01: qty 10
  Filled 2023-10-01: qty 16
  Filled 2023-10-01: qty 10
  Filled 2023-10-01 (×3): qty 16
  Filled 2023-10-01 (×2): qty 10
  Filled 2023-10-01: qty 4

## 2023-10-01 NOTE — Progress Notes (Signed)
 Occupational Therapy Treatment Patient Details Name: Kim Anthony [name]: [id number] DOB: 26-Nov-1950 Today's Date: 10/01/2023   History of present illness Pt is a 73 y/o F presenting to ED on 4/9 with weight gain and SOB, LE edema. Admitted for CHF exacerbation. Doppler negative for DVT. PMH includes A fib, MVR of warfarin, COPD, CKD IIIB, CHF with severely reduced RV EF, prediabetes, breast CA   OT comments  This 73 yo female seen today to go over energy conservation with her and provide handout. She did not want to get OOB to chair to eat her supper due to having a busy 2ish hours before I came to see. So energy conservation was gone over with her at bed level. She will continue to benefit from acute OT with follow up HHOT and A prn.      If plan is discharge home, recommend the following:  A little help with walking and/or transfers;A lot of help with bathing/dressing/bathroom;Assistance with cooking/housework;Direct supervision/assist for medications management;Direct supervision/assist for financial management;Assist for transportation;Help with stairs or ramp for entrance;Supervision due to cognitive status   Equipment Recommendations  None recommended by OT       Precautions / Restrictions Precautions Precautions: Fall Recall of Precautions/Restrictions: Impaired Precaution/Restrictions Comments: watch O2 Restrictions Weight Bearing Restrictions Per Provider Order: No       Mobility Bed Mobility Overal bed mobility: Needs Assistance             General bed mobility comments: +2 total A to scoot up in bed             ADL either performed or assessed with clinical judgement   ADL                                         General ADL Comments: Pt did not want to get OOB to eat supper that had just arrived due to having to have an IV started and ultrasound. Went over 5 P's of energy conservation handout with her and started discussion about a program  called Aging Gracefully-- will provide handout next session.    Extremity/Trunk Assessment Upper Extremity Assessment Upper Extremity Assessment: Overall WFL for tasks assessed;Right hand dominant RUE Deficits / Details: discomfort at R shoulder, AROM to 110 degrees, states this is her baseline LUE Deficits / Details: decreased AROM at shoulder in flexion and external rotation, pain noted, less than 80 degrees shoulder flexion, 40 degrees external rotation            Vision Baseline Vision/History: 1 Wears glasses Ability to See in Adequate Light: 0 Adequate Patient Visual Report: No change from baseline           Communication Communication Communication: No apparent difficulties   Cognition Arousal: Alert Behavior During Therapy: WFL for tasks assessed/performed Cognition: No family/caregiver present to determine baseline     Awareness: Online awareness impaired   Attention impairment (select first level of impairment): Selective attention Executive functioning impairment (select all impairments): Problem solving, Reasoning                   Following commands: Intact Following commands impaired: Only follows one step commands consistently      Cueing   Cueing Techniques: Verbal cues, Tactile cues, Visual cues             Pertinent Vitals/ Pain       Pain  Assessment Pain Assessment: No/denies pain         Frequency  Min 2X/week        Progress Toward Goals  OT Goals(current goals can now be found in the care plan section)  Progress towards OT goals: Progressing toward goals  Acute Rehab OT Goals Patient Stated Goal: to eat her supper in bed OT Goal Formulation: With patient Time For Goal Achievement: 10/14/23 Potential to Achieve Goals: Good         AM-PAC OT "6 Clicks" Daily Activity     Outcome Measure   Help from another person eating meals?: None Help from another person taking care of personal grooming?: A Little Help from  another person toileting, which includes using toliet, bedpan, or urinal?: A Little Help from another person bathing (including washing, rinsing, drying)?: A Lot Help from another person to put on and taking off regular upper body clothing?: A Little Help from another person to put on and taking off regular lower body clothing?: A Lot 6 Click Score: 17    End of Session    OT Visit Diagnosis: Muscle weakness (generalized) (M62.81)   Activity Tolerance Patient tolerated treatment well   Patient Left in bed;with call bell/phone within reach;with bed alarm set   Nurse Communication  (O2 sats via secure chat)        Time: 0454-0981 OT Time Calculation (min): 13 min  Charges: OT General Charges $OT Visit: 1 Visit OT Treatments $Self Care/Home Management : 8-22 mins Lindon Romp OT Acute Rehabilitation Services Office [phone]    Evette Georges 10/01/2023, 6:56 PM

## 2023-10-01 NOTE — Plan of Care (Signed)

## 2023-10-01 NOTE — Progress Notes (Signed)
 PHARMACY - ANTICOAGULATION CONSULT NOTE  Pharmacy Consult for Heparin and Coumadin Indication:  MVR  Allergies  Allergen Reactions   Lisinopril Swelling   Acetaminophen-Codeine Itching   Propoxyphene Itching    Darvocet   Tape Rash    Plastic   Tramadol Itching, Nausea And Vomiting and Rash    Patient Measurements: Height: 5\' 5"  (165.1 cm) Weight: 80.9 kg (178 lb 5.6 oz) IBW/kg (Calculated) : 57 HEPARIN DW (KG): 73.1  Vital Signs: Temp: 97.4 F (36.3 C) (04/11 0800) Temp Source: Oral (04/11 0800) BP: 110/55 (04/11 0800) Pulse Rate: 86 (04/11 0800)  Labs: Recent Labs    09/30/23 0005 09/30/23 2109 10/01/23 0905  HGB 9.5*  --  8.9*  HCT 31.9*  --  29.4*  PLT 337  --  316  LABPROT 17.6*  --  18.0*  INR 1.4*  --  1.5*  HEPARINUNFRC  --  0.16* 0.49  CREATININE 1.91*  --  2.04*    Estimated Creatinine Clearance: 25.8 mL/min (A) (by C-G formula based on SCr of 2.04 mg/dL (H)).  Assessment: Pt with MVR who was readmitted for CHF exacerbation. INR 1.4 on admission so we will bridge with heparin. Coumadin continued. Hemoglobin 9.5, platelet count 337.  PTA Coumadin regimen: 2.5 mg daily except 5 mg on Saturdays.  INR came back 1.5 and heparin level is therapeutic.   Hgb 8.9, plt wnl  Goal of Therapy:  Heparin level 0.3-0.7 units/ml INR 2.5-3.5 Monitor platelets by anticoagulation protocol: Yes   Plan:  Continue heparin 1650 units/hr Coumadin 5 mg x 1 Daily heparin level, PT/INR and CBC.  Ulyses Southward, PharmD, BCIDP, AAHIVP, CPP Infectious Disease Pharmacist 10/01/2023 10:40 AM

## 2023-10-01 NOTE — Progress Notes (Signed)
 Mobility Specialist Progress Note:   10/01/23 1216  Mobility  Activity Ambulated with assistance in hallway  Level of Assistance Contact guard assist, steadying assist  Assistive Device Four wheel walker  Distance Ambulated (ft) 80 ft  Activity Response Tolerated well  Mobility Referral Yes  Mobility visit 1 Mobility  Mobility Specialist Start Time (ACUTE ONLY) 0955  Mobility Specialist Stop Time (ACUTE ONLY) 1010  Mobility Specialist Time Calculation (min) (ACUTE ONLY) 15 min   Pt received in bed agreeable to mobility. MinA required for bed mobility, contact guard throughout rest of session. Towards EOS pt c/o SOB. Ambulated on 2L/min throughout, VSS. Returned to supine in bed w/ call bell and personal belongings in reach. All needs met.   Thompson Grayer Mobility Specialist  Please contact vis Secure Chat or  Rehab Office [phone]

## 2023-10-01 NOTE — Progress Notes (Signed)
 OT Cancellation Note  Patient Details Name: Kim Anthony [name]: [id number] DOB: [birth year]   Cancelled Treatment:    Reason Eval/Treat Not Completed: Patient at procedure or test/ unavailable. Pt currently out of room at ultrasound for thyroid scan. Will check back as schedule allows.  Lindon Romp OT Acute Rehabilitation Services Office [phone]    Evette Georges 10/01/2023, 3:30 PM

## 2023-10-01 NOTE — Procedures (Signed)
 Interventional Radiology Procedure:   Indications: Acute on chronic diastolic CHF with prominent RV dysfunction  and poor venous access  Procedure: Failed placement of right arm PICC.  Right jugular central line placed.  Findings: Could not advance a PICC centrally from right arm.  Right internal jugular triple lumen central venous catheter placed, tip at SVC/RA junction.   Complications: None     EBL: Minimal  Plan: Central line is ready to use.   Rashan Patient R. Lowella Dandy, MD  Pager: [phone]

## 2023-10-01 NOTE — Progress Notes (Signed)
 Subjective SOB improved. Feels as if joint pain is a little better. Endorses neck pain from central line.  Physical exam Blood pressure 113/64, pulse 82, temperature (!) 97.4 F (36.3 C), temperature source Oral, resp. rate 17, height 5\' 5"  (1.651 m), weight 80.9 kg, SpO2 94%.  Constitutional: lying in bed, no acute distress Neck: central line in place, without surround swelling, mild TTP Cardiovascular: regular rate and rhythm, mechanical valve click, no JVD appreciated Pulmonary/Chest: Bibasilar rales MSK: left knee with swelling and TTP, diffuse joint tenderness without erythema/warmth  Weight change: 0.2 kg   Intake/Output Summary (Last 24 hours) at 10/01/2023 1343 Last data filed at 10/01/2023 1000 Gross per 24 hour  Intake 726.19 ml  Output 1500 ml  Net -773.81 ml   Net IO Since Admission: -1,073.81 mL [10/01/23 1343]  Labs, images, and other studies    Latest Ref Rng & Units 10/01/2023    9:05 AM 09/30/2023   12:05 AM 09/22/2023    3:34 AM  CBC  WBC 4.0 - 10.5 K/uL 6.1  7.6  7.4   Hemoglobin 12.0 - 15.0 g/dL 8.9  9.5  16.1   Hematocrit 36.0 - 46.0 % 29.4  31.9  34.2   Platelets 150 - 400 K/uL 316  337  332        Latest Ref Rng & Units 10/01/2023    9:05 AM 09/30/2023   12:05 AM 09/22/2023    6:40 AM  BMP  Glucose 70 - 99 mg/dL 096  77  96   BUN 8 - 23 mg/dL 31  25  65   Creatinine 0.44 - 1.00 mg/dL 0.45  4.09  8.11   Sodium 135 - 145 mmol/L 131  135  133   Potassium 3.5 - 5.1 mmol/L 3.7  3.5  4.1   Chloride 98 - 111 mmol/L 96  100  93   CO2 22 - 32 mmol/L 23  25  27    Calcium 8.9 - 10.3 mg/dL 8.2  8.9  8.0      Assessment and plan Hospital day 2  Kim Anthony is a 73 y.o. person living with a history of A-fib, mitral valve replacement on warfarin, COPD, CKD 3B, congestive heart failure with severely reduced right ventricular ejection fraction, and history of breast cancer with who presented with weight gain, shortness of breath, malaise and  admitted for volume overload   Heart failure with severely reduced right ventricular ejection fraction HFT following, appreciate recommendations. Currently saturating within goal on home 2 L O2. Unsuccessful PICC placement so IR placed right-sided central line -Continue Milrinone 0.25 mcg/kg/min, Lasix 160 BID  -Monitor Co-ox -Strict I's and O's -Trend BMP -Replete K and Mg as needed -Standing weights -Continue home douneb prn   Mechanical mitral valve A-fib INR remains subtherapeutic at 1.5. Continue Heparin per pharmacy and Coumadin.  COPD TUD O2 at baseline. Continue Breztri and NRT   Elevated TSH/T4 R>L Cheek Swelling  Unclear etiology. Will order US Neck to evaluate thyroid and parotid glands specifically.  Polyarticular Gout Uric acid elevated. Mixed picture but this seems more like chronic rather than an acute flare. Received Colchicine and continues on course of prednisone. Consider renally dosed Allopurinol upon discharge.   Normocytic anemia Stable. -Trend CBC   Chronic neck lower back pain On Norco 10/23/2023 every 4 hours as needed at home. Will initiate renally dose gabapentin, 200 mg 3 times  daily.    Hypertension Hold antihypertensives given soft BP   Hyperlipidemia Continue simvastatin 20 mg daily. LDL is [address] DO 10/01/2023, 1:43 PM  Pager: [phone] After 5pm or weekend: [phone]

## 2023-10-01 NOTE — Progress Notes (Signed)
 Advanced Heart Failure Rounding Note  Cardiologist: Arvilla Meres, MD  Chief Complaint: A/c RV failure  Subjective:    PICC team unable to place PICC yesterday, difficulty getting access.   On Milrinone 0.25 through PIV.   Received 160 mg IV Lasix q12 yesterday. I/Os incomplete from yesterday but Pt reports brisk urinary response and improvement in edema. Overall feeling better but c/w gout pain in rt hand.   Labs not collected yet.   She now has PureWick to help w/ urine collection.    Objective:   Weight Range: 80.9 kg Body mass index is 29.68 kg/m.   Vital Signs:   Temp:  [97.4 F (36.3 C)-98.2 F (36.8 C)] 97.4 F (36.3 C) (04/11 0800) Pulse Rate:  [73-103] 86 (04/11 0800) Resp:  [17-26] 20 (04/11 0800) BP: (103-147)/(55-126) 110/55 (04/11 0800) SpO2:  [94 %-99 %] 94 % (04/11 0800) Weight:  [79 kg-80.9 kg] 80.9 kg (04/11 0446) Last BM Date : 09/30/23  Weight change: Filed Weights   09/30/23 0817 09/30/23 1152 10/01/23 0446  Weight: 77.6 kg 79 kg 80.9 kg    Intake/Output:   Intake/Output Summary (Last 24 hours) at 10/01/2023 0819 Last data filed at 10/01/2023 0721 Gross per 24 hour  Intake 726.19 ml  Output 1450 ml  Net -723.81 ml      Physical Exam    General:  chronically ill/fatigued. No resp difficulty HEENT: Normal Neck: Supple. JVP elevated to ear . Carotids 2+ bilat; no bruits. No lymphadenopathy or thyromegaly appreciated. Cor: PMI nondisplaced. Regular rate & rhythm. No rubs, gallops or murmurs. Lungs: decreased BS at the base bilaterally + faint bibasilar crackles  Abdomen: Soft, nontender, nondistended. No hepatosplenomegaly. No bruits or masses. Good bowel sounds. Extremities: No cyanosis, clubbing, rash, 1+ thigh edema, + unna boots  Neuro: Alert & orientedx3, cranial nerves grossly intact. moves all 4 extremities w/o difficulty. Affect pleasant   Telemetry   NSR 80s, personally reviewed   EKG    N/A   Labs     CBC Recent Labs    09/30/23 0005  WBC 7.6  HGB 9.5*  HCT 31.9*  MCV 93.5  PLT 337   Basic Metabolic Panel Recent Labs    40/98/11 0005  NA 135  K 3.5  CL 100  CO2 25  GLUCOSE 77  BUN 25*  CREATININE 1.91*  CALCIUM 8.9  MG 2.0   Liver Function Tests Recent Labs    09/30/23 0005  AST 17  ALT 13  ALKPHOS 129*  BILITOT 1.3*  PROT 6.8  ALBUMIN 2.9*   No results for input(s): "LIPASE", "AMYLASE" in the last 72 hours. Cardiac Enzymes No results for input(s): "CKTOTAL", "CKMB", "CKMBINDEX", "TROPONINI" in the last 72 hours.  BNP: BNP (last 3 results) Recent Labs    08/28/23 0414 09/19/23 2151 09/30/23 0005  BNP 589.0* 900.4* 723.7*    ProBNP (last 3 results) No results for input(s): "PROBNP" in the last 8760 hours.   D-Dimer No results for input(s): "DDIMER" in the last 72 hours. Hemoglobin A1C No results for input(s): "HGBA1C" in the last 72 hours. Fasting Lipid Panel Recent Labs    09/30/23 0005  CHOL 100  HDL 35*  LDLCALC 48  TRIG 83  CHOLHDL 2.9   Thyroid Function Tests Recent Labs    09/30/23 0005  TSH 6.433*    Other results:   Imaging    [place] EKG SITE RITE Result Date: 09/30/2023 If Site Rite image not attached, placement could not  be confirmed due to current cardiac rhythm.  VAS [place] LOWER EXTREMITY VENOUS (DVT) Result Date: 09/30/2023  Lower Venous DVT Study Patient Name:  Kim Anthony  Date of Exam:   09/30/2023 Medical Rec #: [id number]      Accession #:    [id number] Date of Birth: 02-02-1951       Patient Gender: F Patient Age:   73 years Exam Location:  [facility] Procedure:      VAS [place] LOWER EXTREMITY VENOUS (DVT) Referring Phys: Charissa Bash --------------------------------------------------------------------------------  Indications: Pain.  Risk Factors: Cancer Breast DVT Hx Surgery EGD 08/24/23. Anticoagulation: Warfarin. Limitations: Poor ultrasound/tissue interface. Comparison Study: No significant changes see since  previous exam 02/05/22. Performing Technologist: Shona Simpson  Examination Guidelines: A complete evaluation includes B-mode imaging, spectral Doppler, color Doppler, and power Doppler as needed of all accessible portions of each vessel. Bilateral testing is considered an integral part of a complete examination. Limited examinations for reoccurring indications may be performed as noted. The reflux portion of the exam is performed with the patient in reverse Trendelenburg.  +-----+---------------+---------+-----------+----------+--------------+ RIGHTCompressibilityPhasicitySpontaneityPropertiesThrombus Aging +-----+---------------+---------+-----------+----------+--------------+ CFV  Full           Yes      Yes                                 +-----+---------------+---------+-----------+----------+--------------+   +---------+---------------+---------+-----------+----------+---------------+ LEFT     CompressibilityPhasicitySpontaneityPropertiesThrombus Aging  +---------+---------------+---------+-----------+----------+---------------+ CFV      Full           Yes      Yes                                  +---------+---------------+---------+-----------+----------+---------------+ SFJ      Full                                                         +---------+---------------+---------+-----------+----------+---------------+ FV Prox  Full                                                         +---------+---------------+---------+-----------+----------+---------------+ FV Mid   Full                    Yes                                  +---------+---------------+---------+-----------+----------+---------------+ FV Distal                        Yes                  Patent by color +---------+---------------+---------+-----------+----------+---------------+ PFV                              Yes                  Patent by color  +---------+---------------+---------+-----------+----------+---------------+ POP  Full           Yes      Yes                                  +---------+---------------+---------+-----------+----------+---------------+ PTV      Full                    Yes                                  +---------+---------------+---------+-----------+----------+---------------+ PERO     Full                    Yes                                  +---------+---------------+---------+-----------+----------+---------------+     Summary: RIGHT: - No evidence of common femoral vein obstruction.   LEFT: - There is no evidence of deep vein thrombosis in the lower extremity.  - No cystic structure found in the popliteal fossa.  *See table(s) above for measurements and observations. Electronically signed by Carolynn Sayers on 09/30/2023 at 3:05:48 PM.    Final      Medications:     Scheduled Medications:  budeson-glycopyrrolate-formoterol  2 puff Inhalation BID   dapagliflozin propanediol  10 mg Oral Daily   gabapentin  200 mg Oral TID   nicotine  7 mg Transdermal Daily   predniSONE  20 mg Oral Q breakfast   simvastatin  20 mg Oral q1800   Warfarin - Pharmacist Dosing Inpatient   Does not apply q1600    Infusions:  heparin 1,650 Units/hr (10/01/23 0511)   milrinone 0.25 mcg/kg/min (10/01/23 0511)    PRN Medications: acetaminophen **OR** acetaminophen, albuterol, HYDROcodone-acetaminophen, hydrOXYzine, ipratropium-albuterol, senna-docusate    Patient Profile   Kim Anthony is a 73 y.o. female with a history of mechanical MVR in 2001 in [place], [place], CAD, hx breast cancer s/p left mastectomy and radiation/chemo, tobacco use, COPD, prior PE/DVT, HTN, chronic hypoxic respiratory failure (on home O2) and systolic RV failure   Assessment/Plan   1. Acute on chronic diastolic CHF with prominent RV dysfunction: COPD, OHS/OSA, and diastolic CHF from valvular disease likely play a role in the RV  failure.  Last echo with EF 55-60% with D-shaped septum, severe RV dilation, severe RV dysfunction, mechanical MV with mean gradient 3 mmHg, severe TR. She has had multiple recent admissions.  She is markedly volume overloaded on exam, has required milrinone in the past for RV support while diuresing.  Suspect end stage RV failure. She is now on empiric Milrinone 0.25 through PIV. PICC team unable to get access for PICC yesterday. Will need IR to place PICC  - continue milrinone 0.25 mcg/kg/min for RV support  - once PICC placed, will follow CVPs  - continue IV Lasix 160 mg bid. If poor response, will transition to Lasix gtt at 20/hr  - check BMP  - Unna boots.  - Hold Marcelline Deist for now given purewick and risk for GU infection   2. Mechanical mitral valve: Stable on 3/25 echo.  INR 1.4 yesterday.  - Needs heparin gtt while INR subtherapeutic.  - Warfarin for goal INR 2.5-3.5.  - Check INR today   3. Gout: Uric acid 16.  -  Prednisone burst 20 mg daily x 3 days.  - Continue colchicine    4. CAD: CTO RCA on 7/22 cath.  - Continue statin.   5. Atrial fibrillation/flutter: Paroxysmal. Maintaining NSR.  - Monitor while on milrinone  - heparin gtt.   6. CKD stage 3:  - SCr 2.7 on admission. Creatinine has been trending down (1.9 yesterday), Today's BMP not resulted yet.  - will follow BMP closely     7. Tricuspid regurgitation: Severe in setting of RV failure.      Length of Stay: 1  Robbie Lis, PA-C  10/01/2023, 8:19 AM  Advanced Heart Failure Team Pager [phone] (M-F; 7a - 5p)  Please contact [facility] for night-coverage after hours (5p -7a ) and weekends on [email or web address]

## 2023-10-02 DIAGNOSIS — I50813 Acute on chronic right heart failure: Secondary | ICD-10-CM | POA: Diagnosis not present

## 2023-10-02 LAB — BASIC METABOLIC PANEL WITH GFR
Anion gap: 11 (ref 5–15)
BUN: 31 mg/dL — ABNORMAL HIGH (ref 8–23)
CO2: 25 mmol/L (ref 22–32)
Calcium: 8.4 mg/dL — ABNORMAL LOW (ref 8.9–10.3)
Chloride: 96 mmol/L — ABNORMAL LOW (ref 98–111)
Creatinine, Ser: 1.97 mg/dL — ABNORMAL HIGH (ref 0.44–1.00)
GFR, Estimated: 26 mL/min — ABNORMAL LOW (ref >60)
Glucose, Bld: 124 mg/dL — ABNORMAL HIGH (ref 70–99)
Potassium: 3.3 mmol/L — ABNORMAL LOW (ref 3.5–5.1)
Sodium: 132 mmol/L — ABNORMAL LOW (ref 135–145)

## 2023-10-02 LAB — CBC
HCT: 27.7 % — ABNORMAL LOW (ref 36.0–46.0)
Hemoglobin: 8.4 g/dL — ABNORMAL LOW (ref 12.0–15.0)
MCH: 26.9 pg (ref 26.0–34.0)
MCHC: 30.3 g/dL (ref 30.0–36.0)
MCV: 88.8 fL (ref 80.0–100.0)
Platelets: 315 10*3/uL (ref 150–400)
RBC: 3.12 MIL/uL — ABNORMAL LOW (ref 3.87–5.11)
RDW: 19.6 % — ABNORMAL HIGH (ref 11.5–15.5)
WBC: 8.3 10*3/uL (ref 4.0–10.5)
nRBC: 0 % (ref 0.0–0.2)

## 2023-10-02 LAB — HEPARIN LEVEL (UNFRACTIONATED): Heparin Unfractionated: 0.52 [IU]/mL (ref 0.30–0.70)

## 2023-10-02 LAB — APTT: aPTT: 152 s — ABNORMAL HIGH (ref 24–36)

## 2023-10-02 LAB — MAGNESIUM: Magnesium: 2 mg/dL (ref 1.7–2.4)

## 2023-10-02 LAB — COOXEMETRY PANEL
Carboxyhemoglobin: 2.5 % — ABNORMAL HIGH (ref 0.5–1.5)
Methemoglobin: 1 % (ref 0.0–1.5)
O2 Saturation: 66.5 %
Total hemoglobin: 9 g/dL — ABNORMAL LOW (ref 12.0–16.0)

## 2023-10-02 LAB — PROTIME-INR
INR: 1.6 — ABNORMAL HIGH (ref 0.8–1.2)
Prothrombin Time: 19 s — ABNORMAL HIGH (ref 11.4–15.2)

## 2023-10-02 MED ORDER — HYDROCODONE-ACETAMINOPHEN 5-325 MG PO TABS
1.0000 | ORAL_TABLET | ORAL | Status: DC | PRN
  Administered 2023-10-02 – 2023-10-08 (×37): 1 via ORAL
  Filled 2023-10-02 (×37): qty 1

## 2023-10-02 MED ORDER — POTASSIUM CHLORIDE CRYS ER 20 MEQ PO TBCR
40.0000 meq | EXTENDED_RELEASE_TABLET | Freq: Four times a day (QID) | ORAL | Status: AC
  Administered 2023-10-02 (×2): 40 meq via ORAL
  Filled 2023-10-02 (×2): qty 2

## 2023-10-02 MED ORDER — PREDNISONE 20 MG PO TABS
20.0000 mg | ORAL_TABLET | Freq: Every day | ORAL | Status: AC
  Administered 2023-10-03 – 2023-10-04 (×2): 20 mg via ORAL
  Filled 2023-10-02 (×2): qty 1

## 2023-10-02 MED ORDER — COLCHICINE 0.3 MG HALF TABLET
0.3000 mg | ORAL_TABLET | Freq: Every day | ORAL | Status: DC
  Administered 2023-10-02 – 2023-10-08 (×7): 0.3 mg via ORAL
  Filled 2023-10-02 (×7): qty 1

## 2023-10-02 MED ORDER — WARFARIN SODIUM 5 MG PO TABS
5.0000 mg | ORAL_TABLET | Freq: Once | ORAL | Status: AC
  Administered 2023-10-02: 5 mg via ORAL
  Filled 2023-10-02: qty 1

## 2023-10-02 NOTE — Plan of Care (Signed)

## 2023-10-02 NOTE — Progress Notes (Addendum)
 Subjective Patient doing well this morning with improved dyspnea and lower extremity edema.  She also has had improvement in her gout pain although it is still present.  Physical exam Blood pressure 118/85, pulse 99, temperature (!) 97.4 F (36.3 C), temperature source Oral, resp. rate 20, height 5\' 5"  (1.651 m), weight 76.6 kg, SpO2 93%.  Constitutional: lying in bed, no acute distress Neck: central line in place, clean dry and well-dressed Cardiovascular: regular rate and rhythm Pulmonary/Chest: Normal work of breathing MSK: Improved left knee with swelling and minimal TTP  Weight change: -1.033 kg   Intake/Output Summary (Last 24 hours) at 10/02/2023 0838 Last data filed at 10/02/2023 0446 Gross per 24 hour  Intake 793.41 ml  Output 4025 ml  Net -3231.59 ml   Net IO Since Admission: -3,955.4 mL [10/02/23 0838]  Labs, images, and other studies    Latest Ref Rng & Units 10/02/2023    5:21 AM 10/01/2023    9:05 AM 09/30/2023   12:05 AM  CBC  WBC 4.0 - 10.5 K/uL 8.3  6.1  7.6   Hemoglobin 12.0 - 15.0 g/dL 8.4  8.9  9.5   Hematocrit 36.0 - 46.0 % 27.7  29.4  31.9   Platelets 150 - 400 K/uL 315  316  337        Latest Ref Rng & Units 10/02/2023    5:21 AM 10/01/2023    9:05 AM 09/30/2023   12:05 AM  BMP  Glucose 70 - 99 mg/dL 161  096  77   BUN 8 - 23 mg/dL 31  31  25    Creatinine 0.44 - 1.00 mg/dL 0.45  4.09  8.11   Sodium 135 - 145 mmol/L 132  131  135   Potassium 3.5 - 5.1 mmol/L 3.3  3.7  3.5   Chloride 98 - 111 mmol/L 96  96  100   CO2 22 - 32 mmol/L 25  23  25    Calcium 8.9 - 10.3 mg/dL 8.4  8.2  8.9      Assessment and plan Hospital day 2  HALIE GASS is a 73 y.o. person living with a history of A-fib, mitral valve replacement on warfarin, COPD, CKD 3B, congestive heart failure with severely reduced right ventricular ejection fraction, and history of breast cancer with who presented with weight gain, shortness of breath, malaise and admitted  for volume overload   Heart failure with severely reduced right ventricular ejection fraction Low output heart failure Advanced heart failure following, appreciate recommendations. Currently saturating within goal on home 2 L O2.  Central line in place, measuring co-ox, and CVP. Co-ox this morning 66.  4 L urine output in the last 24 hours with stable creatinine at 1.97.  Swelling is much better in her lower extremities and we could consider transitioning from Unna boots to compression stockings. -Continue Milrinone 0.25 mcg/kg/min, Lasix 160 BID, per heart failure -Monitor Co-ox -Strict I's and O's -Trend BMP -Replete K and Mg as needed -Standing weights   Mechanical mitral valve A-fib INR remains subtherapeutic at 1.6. Continue Heparin per pharmacy and Coumadin.  COPD TUD O2 at baseline. Continue Breztri and NRT   Elevated TSH/T4 R>L Cheek Swelling  Thyroid ultrasound showed 1 left superior lobe nodule that was 1-1.4 cm and multiple subcentimeter nodules bilaterally.  Recommendations do not require biopsy or follow-up.  Parotid ultrasound without abnormalities. TSH/T4 elevation could be  in the setting of acute illness but also pituitary issues. I will add a prolactin to AM labs and if normal would recommend repeating TSH/T4 when out of acute illness. If abnormal we will consider pituitary MRI and further hormone levels during admission.  Polyarticular Gout Uric acid elevated. Mixed picture but this seems more like chronic rather than an acute flare. Received Colchicine and continues on course of prednisone. Consider renally dosed Allopurinol upon discharge. - extend prednisone to 5 day course and continue colchicine    Normocytic anemia Stable. -Trend CBC   Chronic neck lower back pain On Norco 10/23/2023 every 4 hours as needed at home and gabapentin 200 mg 3 times daily.  The Norco is working but wearing off a little too early so we will change this to every 3 hours.    Hypertension Hold antihypertensives given soft BP and need for inotropic support.   Hyperlipidemia Continue simvastatin 20 mg daily. LDL is 48   Hypokalemia Repleted today.  LUE swelling Recent Right internal jugular DVT Asymmetric LUE swelling w/ recent DVT c/b history of LN dissection during breast cancer diagnosis and treatment in the LUE. We will get a LUE doppler here for further eval.  Cleven Dallas, DO Internal Medicine Resident, PGY-2 Please contact the on call pager at [phone] for any urgent or emergent needs. 8:38 AM 10/02/2023

## 2023-10-02 NOTE — Progress Notes (Signed)
  Progress Note   Date: 10/01/2023  Patient Name: Kim Anthony        [name]#: [id number]  Clarification of diagnosis: I think we're going to go with acute on CKD 3a.

## 2023-10-02 NOTE — Progress Notes (Signed)
 Rounding Note    Patient Name: Kim Anthony Date of Encounter: 10/02/2023  [facility] Cardiologist: Jules Oar, MD   Subjective   73 year old female with a history of acute on chronic diastolic congestive heart failure with significant RV dysfunction, COPD, obesity hypoventilation/obstructive sleep apnea  She has been seen by the CHF team.  She has been on IV milrinone  She has been somewhat resistant to IV Lasix.  She has been on high-dose Lasix-160 mg twice a day.  She has mechanical mitral valve.  Her INR is subtherapeutic. She has chronic kidney disease.  Creatinine for the past several days has been around 2.  She has diuresed 4.6 L so far during this admission.  Inpatient Medications    Scheduled Meds:  budeson-glycopyrrolate-formoterol  2 puff Inhalation BID   colchicine  0.3 mg Oral Daily   gabapentin  200 mg Oral TID   nicotine  7 mg Transdermal Daily   [START ON 10/03/2023] predniSONE  20 mg Oral Q breakfast   simvastatin  20 mg Oral q1800   warfarin  5 mg Oral ONCE-1600   Warfarin - Pharmacist Dosing Inpatient   Does not apply q1600   Continuous Infusions:  furosemide 160 mg (10/02/23 0834)   heparin 1,650 Units/hr (10/02/23 1343)   milrinone 0.25 mcg/kg/min (10/02/23 0607)   PRN Meds: acetaminophen **OR** acetaminophen, albuterol, HYDROcodone-acetaminophen, hydrOXYzine, ipratropium-albuterol, senna-docusate   Vital Signs    Vitals:   10/02/23 0445 10/02/23 0620 10/02/23 0811 10/02/23 1417  BP: 130/72  118/85 (!) 146/89  Pulse: 95  99 93  Resp: 16  20 18   Temp: 97.6 F (36.4 C)  (!) 97.4 F (36.3 C) 98 F (36.7 C)  TempSrc: Oral  Oral Oral  SpO2: 95%  93% 91%  Weight:  76.6 kg    Height:        Intake/Output Summary (Last 24 hours) at 10/02/2023 1425 Last data filed at 10/02/2023 1421 Gross per 24 hour  Intake 1513.41 ml  Output 5075 ml  Net -3561.59 ml      10/02/2023    6:20 AM 10/01/2023    4:46 AM 09/30/2023   11:52  AM  Last 3 Weights  Weight (lbs) 168 lb 12.8 oz 178 lb 5.6 oz 174 lb 3.2 oz  Weight (kg) 76.567 kg 80.9 kg 79.017 kg      Telemetry    Sinus rhythm  - Personally Reviewed  ECG     - Personally Reviewed  Physical Exam   GEN: elderly female ,  no acute distress  Neck: No JVD Cardiac: RRR, , mechanical S1   Respiratory: few scattered wheezes  GI: Soft, nontender, non-distended  MS: trace edema  No deformity. Neuro:  Nonfocal  Psych: Normal affect   Labs    High Sensitivity Troponin:   Recent Labs  Lab 09/19/23 2152 09/20/23 0116 09/20/23 0201 09/20/23 0409  TROPONINIHS 13 265* 14 12     Chemistry Recent Labs  Lab 09/30/23 0005 10/01/23 0905 10/02/23 0521  NA 135 131* 132*  K 3.5 3.7 3.3*  CL 100 96* 96*  CO2 25 23 25   GLUCOSE 77 155* 124*  BUN 25* 31* 31*  CREATININE 1.91* 2.04* 1.97*  CALCIUM 8.9 8.2* 8.4*  MG 2.0 2.0 2.0  PROT 6.8  --   --   ALBUMIN 2.9*  --   --   AST 17  --   --   ALT 13  --   --   ALKPHOS  129*  --   --   BILITOT 1.3*  --   --   GFRNONAA 27* 25* 26*  ANIONGAP 10 12 11     Lipids  Recent Labs  Lab 09/30/23 0005  CHOL 100  TRIG 83  HDL 35*  LDLCALC 48  CHOLHDL 2.9    Hematology Recent Labs  Lab 09/30/23 0005 10/01/23 0905 10/02/23 0521  WBC 7.6 6.1 8.3  RBC 3.41* 3.27* 3.12*  HGB 9.5* 8.9* 8.4*  HCT 31.9* 29.4* 27.7*  MCV 93.5 89.9 88.8  MCH 27.9 27.2 26.9  MCHC 29.8* 30.3 30.3  RDW 20.0* 19.7* 19.6*  PLT 337 316 315   Thyroid  Recent Labs  Lab 09/30/23 0005  TSH 6.433*  FREET4 1.32*    BNP Recent Labs  Lab 09/30/23 0005  BNP 723.7*    DDimer No results for input(s): "DDIMER" in the last 168 hours.   Radiology    IR Fluoro Guide CV Line Right Result Date: 10/01/2023 INDICATION: Heart failure and poor venous access. Patient needs central venous access while in the hospital. EXAM: 1. Right upper extremity venogram. 2. Failed placement of right upper extremity PICC line. 3. Placement of non tunneled  central venous catheter using ultrasound and fluoroscopic guidance Physician: Olive Better. Julietta Ogren, MD FLUOROSCOPY TIME:  Radiation Exposure Index (as provided by the fluoroscopic device): 39.8 mGy Kerma MEDICATIONS: 1% lidocaine for local anesthetic ANESTHESIA/SEDATION: None PROCEDURE: PICC line placement was initially requested. The procedure was explained to the patient. The risks and benefits of the procedure were discussed and the patient's questions were addressed. Informed consent was obtained from the patient. Left upper extremity was not evaluated due to history of left mastectomy. Patent right brachial veins were identified with ultrasound and ultrasound images were saved for documentation. Right upper arm was prepped and draped in sterile fashion. Maximal barrier sterile technique was utilized including caps, mask, sterile gowns, sterile gloves, sterile drape, hand hygiene and skin antiseptic. Skin was anesthetized with 1% lidocaine. Using ultrasound guidance, a 21 gauge needle was directed into a brachial vein and wire was advanced into the right axillary region. Peel-away sheath was placed. Initially, wire would not advance centrally. Venogram images demonstrated complex venous drainage in the right upper extremity. A wire was advanced centrally. However, PICC catheter could not be advanced centrally. Eventually, this access was completely removed. Informed consent was obtained for placement of a non tunneled central venous catheter. The patient was placed supine on the interventional table. Ultrasound confirmed a patent right internal jugularvein. Ultrasound images were obtained for documentation. The right neck was prepped and draped in a sterile fashion. Maximal barrier sterile technique was utilized including caps, mask, sterile gowns, sterile gloves, sterile drape, hand hygiene and skin antiseptic. The right neck was anesthetized with 1% lidocaine. A small incision was made with #11 blade scalpel. Using  ultrasound guidance, needle was directed into the right internal jugular vein. Wire was advanced centrally. The tract was dilated to accommodate a 7 French triple-lumen central venous catheter. Catheter tip was placed at the junction of the superior vena cava and right atrium. All 3 lumens aspirated and flushed well. Lumens were flushed with saline. Central line was sutured to skin. Fluoroscopic and ultrasound images were taken and saved for documentation. FINDINGS: 1. Complex venous drainage in the right upper extremity. Tortuous veins. PICC line could not be placed in the right upper extremity. 2. Right jugular central venous catheter was placed. Catheter tip at the superior cavoatrial junction. COMPLICATIONS: None IMPRESSION:  1. Failed attempt to place a right upper extremity PICC line. 2. Successful placement of a right jugular non tunneled central venous catheter using ultrasound and fluoroscopic guidance. Electronically Signed   By: Elene Griffes M.D.   On: 10/01/2023 17:44   IR US  Guide Vasc Access Right Result Date: 10/01/2023 INDICATION: Heart failure and poor venous access. Patient needs central venous access while in the hospital. EXAM: 1. Right upper extremity venogram. 2. Failed placement of right upper extremity PICC line. 3. Placement of non tunneled central venous catheter using ultrasound and fluoroscopic guidance Physician: Olive Better. Julietta Ogren, MD FLUOROSCOPY TIME:  Radiation Exposure Index (as provided by the fluoroscopic device): 39.8 mGy Kerma MEDICATIONS: 1% lidocaine for local anesthetic ANESTHESIA/SEDATION: None PROCEDURE: PICC line placement was initially requested. The procedure was explained to the patient. The risks and benefits of the procedure were discussed and the patient's questions were addressed. Informed consent was obtained from the patient. Left upper extremity was not evaluated due to history of left mastectomy. Patent right brachial veins were identified with ultrasound and  ultrasound images were saved for documentation. Right upper arm was prepped and draped in sterile fashion. Maximal barrier sterile technique was utilized including caps, mask, sterile gowns, sterile gloves, sterile drape, hand hygiene and skin antiseptic. Skin was anesthetized with 1% lidocaine. Using ultrasound guidance, a 21 gauge needle was directed into a brachial vein and wire was advanced into the right axillary region. Peel-away sheath was placed. Initially, wire would not advance centrally. Venogram images demonstrated complex venous drainage in the right upper extremity. A wire was advanced centrally. However, PICC catheter could not be advanced centrally. Eventually, this access was completely removed. Informed consent was obtained for placement of a non tunneled central venous catheter. The patient was placed supine on the interventional table. Ultrasound confirmed a patent right internal jugularvein. Ultrasound images were obtained for documentation. The right neck was prepped and draped in a sterile fashion. Maximal barrier sterile technique was utilized including caps, mask, sterile gowns, sterile gloves, sterile drape, hand hygiene and skin antiseptic. The right neck was anesthetized with 1% lidocaine. A small incision was made with #11 blade scalpel. Using ultrasound guidance, needle was directed into the right internal jugular vein. Wire was advanced centrally. The tract was dilated to accommodate a 7 French triple-lumen central venous catheter. Catheter tip was placed at the junction of the superior vena cava and right atrium. All 3 lumens aspirated and flushed well. Lumens were flushed with saline. Central line was sutured to skin. Fluoroscopic and ultrasound images were taken and saved for documentation. FINDINGS: 1. Complex venous drainage in the right upper extremity. Tortuous veins. PICC line could not be placed in the right upper extremity. 2. Right jugular central venous catheter was placed.  Catheter tip at the superior cavoatrial junction. COMPLICATIONS: None IMPRESSION: 1. Failed attempt to place a right upper extremity PICC line. 2. Successful placement of a right jugular non tunneled central venous catheter using ultrasound and fluoroscopic guidance. Electronically Signed   By: Elene Griffes M.D.   On: 10/01/2023 17:44   IR PICC PLACEMENT RIGHT >5 YRS INC IMG GUIDE Result Date: 10/01/2023 INDICATION: Heart failure and poor venous access. Patient needs central venous access while in the hospital. EXAM: 1. Right upper extremity venogram. 2. Failed placement of right upper extremity PICC line. 3. Placement of non tunneled central venous catheter using ultrasound and fluoroscopic guidance Physician: Olive Better. Julietta Ogren, MD FLUOROSCOPY TIME:  Radiation Exposure Index (as provided by the fluoroscopic  device): 39.8 mGy Kerma MEDICATIONS: 1% lidocaine for local anesthetic ANESTHESIA/SEDATION: None PROCEDURE: PICC line placement was initially requested. The procedure was explained to the patient. The risks and benefits of the procedure were discussed and the patient's questions were addressed. Informed consent was obtained from the patient. Left upper extremity was not evaluated due to history of left mastectomy. Patent right brachial veins were identified with ultrasound and ultrasound images were saved for documentation. Right upper arm was prepped and draped in sterile fashion. Maximal barrier sterile technique was utilized including caps, mask, sterile gowns, sterile gloves, sterile drape, hand hygiene and skin antiseptic. Skin was anesthetized with 1% lidocaine. Using ultrasound guidance, a 21 gauge needle was directed into a brachial vein and wire was advanced into the right axillary region. Peel-away sheath was placed. Initially, wire would not advance centrally. Venogram images demonstrated complex venous drainage in the right upper extremity. A wire was advanced centrally. However, PICC catheter could not  be advanced centrally. Eventually, this access was completely removed. Informed consent was obtained for placement of a non tunneled central venous catheter. The patient was placed supine on the interventional table. Ultrasound confirmed a patent right internal jugularvein. Ultrasound images were obtained for documentation. The right neck was prepped and draped in a sterile fashion. Maximal barrier sterile technique was utilized including caps, mask, sterile gowns, sterile gloves, sterile drape, hand hygiene and skin antiseptic. The right neck was anesthetized with 1% lidocaine. A small incision was made with #11 blade scalpel. Using ultrasound guidance, needle was directed into the right internal jugular vein. Wire was advanced centrally. The tract was dilated to accommodate a 7 French triple-lumen central venous catheter. Catheter tip was placed at the junction of the superior vena cava and right atrium. All 3 lumens aspirated and flushed well. Lumens were flushed with saline. Central line was sutured to skin. Fluoroscopic and ultrasound images were taken and saved for documentation. FINDINGS: 1. Complex venous drainage in the right upper extremity. Tortuous veins. PICC line could not be placed in the right upper extremity. 2. Right jugular central venous catheter was placed. Catheter tip at the superior cavoatrial junction. COMPLICATIONS: None IMPRESSION: 1. Failed attempt to place a right upper extremity PICC line. 2. Successful placement of a right jugular non tunneled central venous catheter using ultrasound and fluoroscopic guidance. Electronically Signed   By: Elene Griffes M.D.   On: 10/01/2023 17:44   US  SOFT TISSUE HEAD & NECK (NON-THYROID) Result Date: 10/01/2023 CLINICAL DATA:  Neck swelling EXAM: ULTRASOUND OF HEAD/NECK SOFT TISSUES TECHNIQUE: Ultrasound examination of the head and neck soft tissues was performed in the area of clinical concern. COMPARISON:  None available FINDINGS: Sonographic  evaluation of the parotid glands demonstrate no significant abnormality. No enlarged neck lymph nodes are identified. IMPRESSION: No significant sonographic abnormalities of the parotid glands. Electronically Signed   By: Elester Grim M.D.   On: 10/01/2023 17:08   US  THYROID Result Date: 10/01/2023 CLINICAL DATA:  Neck swelling.  Elevated TSH. EXAM: THYROID ULTRASOUND TECHNIQUE: Ultrasound examination of the thyroid gland and adjacent soft tissues was performed. COMPARISON:  None available. FINDINGS: Parenchymal Echotexture: Mildly heterogenous Isthmus: 0.6 cm Right lobe: 3.5 x 2.5 x 1.7 cm Left lobe: 4.5 x 2.2 x 1.8 cm _________________________________________________________ Estimated total number of nodules >/= 1 cm: 1 Number of spongiform nodules >/=  2 cm not described below (TR1): 0 Number of mixed cystic and solid nodules >/= 1.5 cm not described below (TR2): 0 _________________________________________________________ Nodule # 1:  Location: Left; superior Maximum size: 1.4 cm; Other 2 dimensions: 1.2 x 1.2 cm Composition: solid/almost completely solid (2) Echogenicity: isoechoic (1) Shape: not taller-than-wide (0) Margins: ill-defined (0) Echogenic foci: none (0) ACR TI-RADS total points: 3. ACR TI-RADS risk category: TR3 (3 points). ACR TI-RADS recommendations: Given size (<1.4 cm) and appearance, this nodule does NOT meet TI-RADS criteria for biopsy or dedicated follow-up. _________________________________________________________ Additional subcentimeter thyroid nodules do not meet criteria for imaging follow-up. IMPRESSION: Bilateral thyroid nodules measuring up to 1.4 cm diameter criteria for FNA or imaging follow-up. The above is in keeping with the ACR TI-RADS recommendations - J Am Coll Radiol 2017;14:587-595. Electronically Signed   By: Elester Grim M.D.   On: 10/01/2023 17:07   US  EKG SITE RITE Result Date: 09/30/2023 If Site Rite image not attached, placement could not be confirmed due to  current cardiac rhythm.   Cardiac Studies      Patient Profile     73 y.o. female with BI-V CHF   Assessment & Plan     1.  Acute on chronic diastolic CHF with significant RV dysfunction: The patient has severe COPD.  She stopped smoking 1 month ago.  I suspect a lot of her right heart failure is due to severe COPD and subsequent pulmonary hypertension.  She continues to diurese.  Her baseline creatinine is now around 2. Continue current medications for now.  2.  Mechanical mitral valve.  Her INR is 1.6 today.  She is currently on heparin drip as well as warfarin since she is subtherapeutic.   3.  Coronary artery disease: She has a CTO of her right coronary artery.  She is not having any episodes of angina.         For questions or updates, please contact [facility] Please consult [email or web address] for contact info under        Signed, Ahmad Alert, MD  10/02/2023, 2:25 PM

## 2023-10-02 NOTE — Progress Notes (Addendum)
 PHARMACY - ANTICOAGULATION CONSULT NOTE  Pharmacy Consult for Heparin and Coumadin Indication:  MVR  Allergies  Allergen Reactions   Lisinopril Swelling   Acetaminophen-Codeine Itching   Propoxyphene Itching    Darvocet   Tape Rash    Plastic   Tramadol Itching, Nausea And Vomiting and Rash    Patient Measurements: Height: 5\' 5"  (165.1 cm) Weight: 76.6 kg (168 lb 12.8 oz) IBW/kg (Calculated) : 57 HEPARIN DW (KG): 73.1  Vital Signs: Temp: 97.6 F (36.4 C) (04/12 0445) Temp Source: Oral (04/12 0445) BP: 130/72 (04/12 0445) Pulse Rate: 95 (04/12 0445)  Labs: Recent Labs    09/30/23 0005 09/30/23 2109 10/01/23 0905 10/02/23 0521  HGB 9.5*  --  8.9* 8.4*  HCT 31.9*  --  29.4* 27.7*  PLT 337  --  316 315  LABPROT 17.6*  --  18.0* 19.0*  INR 1.4*  --  1.5* 1.6*  HEPARINUNFRC  --  0.16* 0.49 0.52  CREATININE 1.91*  --  2.04* 1.97*    Estimated Creatinine Clearance: 26 mL/min (A) (by C-G formula based on SCr of 1.97 mg/dL (H)).  Assessment: Pt with MVR who was readmitted for CHF exacerbation. Pharmacy consulted to dose warfarin. INR 1.4 on admission so we will bridge with heparin. Coumadin continued. Hemoglobin 9.5, platelet count 337.  PTA Coumadin regimen: 2.5 mg daily except 5 mg on Saturdays.  INR 1.6, subtherapeutic. Patient eating 75-100% meals - was finishing breakfast this morning when entered room to lay eyes on heparin gtt. Heparin level is 0.52, therapeutic with infusion at 1650 units/hour. Hgb 8.4 low stable, plt 315 wnl. No signs or symptoms of bleeding or issues with the infusion.  Of note, patient taking PTA simvastatin which can interact with warfarin by increasing the INR and the risk of bleeding. Will continue monitor.  Goal of Therapy:  Heparin level 0.3-0.7 units/ml INR 2.5-3.5 Monitor platelets by anticoagulation protocol: Yes   Plan:  Continue heparin infusion at 1650 units/hr Warfarin 5 mg x 1 PO tonight Daily heparin level, PT/INR and  CBC. Monitor for signs and symptoms of bleeding Monitor appetite and for potential drug interactions  Jerrel Mor, PharmD PGY1 Pharmacy Resident 10/02/2023 7:04 AM

## 2023-10-02 NOTE — Progress Notes (Signed)
 Mobility Specialist Progress Note:    10/02/23 1136  Mobility  Activity Ambulated with assistance in room;Transferred from bed to chair  Level of Assistance Contact guard assist, steadying assist  Assistive Device Other (Comment) (furniture, HHA)  Distance Ambulated (ft) 3 ft  Activity Response Tolerated well  Mobility Referral Yes  Mobility visit 1 Mobility  Mobility Specialist Start Time (ACUTE ONLY) 1136  Mobility Specialist Stop Time (ACUTE ONLY) 1146  Mobility Specialist Time Calculation (min) (ACUTE ONLY) 10 min   Pt received in bed, agreeable to transfer B>C. Required CGA for safety, no AD required but relied on chair arms and HHA for additional support. Tolerated well, VSS throughout. Pt declined ambulation in room/ hallway d/t fatigue. Left pt in chair with all needs met, call bell and phones in reach.    Kim Anthony Mobility Specialist Please contact via Special educational needs teacher or  Rehab office at [phone]

## 2023-10-03 ENCOUNTER — Encounter (HOSPITAL_COMMUNITY)

## 2023-10-03 DIAGNOSIS — I50810 Right heart failure, unspecified: Secondary | ICD-10-CM | POA: Diagnosis not present

## 2023-10-03 DIAGNOSIS — I50813 Acute on chronic right heart failure: Secondary | ICD-10-CM | POA: Diagnosis not present

## 2023-10-03 DIAGNOSIS — H5713 Ocular pain, bilateral: Secondary | ICD-10-CM | POA: Insufficient documentation

## 2023-10-03 DIAGNOSIS — I890 Lymphedema, not elsewhere classified: Secondary | ICD-10-CM

## 2023-10-03 DIAGNOSIS — R946 Abnormal results of thyroid function studies: Secondary | ICD-10-CM | POA: Insufficient documentation

## 2023-10-03 DIAGNOSIS — I110 Hypertensive heart disease with heart failure: Secondary | ICD-10-CM | POA: Diagnosis not present

## 2023-10-03 DIAGNOSIS — E559 Vitamin D deficiency, unspecified: Secondary | ICD-10-CM | POA: Insufficient documentation

## 2023-10-03 DIAGNOSIS — H579 Unspecified disorder of eye and adnexa: Secondary | ICD-10-CM

## 2023-10-03 DIAGNOSIS — D649 Anemia, unspecified: Secondary | ICD-10-CM | POA: Insufficient documentation

## 2023-10-03 DIAGNOSIS — Z952 Presence of prosthetic heart valve: Secondary | ICD-10-CM | POA: Diagnosis not present

## 2023-10-03 DIAGNOSIS — M810 Age-related osteoporosis without current pathological fracture: Secondary | ICD-10-CM | POA: Insufficient documentation

## 2023-10-03 DIAGNOSIS — K5909 Other constipation: Secondary | ICD-10-CM | POA: Insufficient documentation

## 2023-10-03 DIAGNOSIS — R600 Localized edema: Secondary | ICD-10-CM | POA: Insufficient documentation

## 2023-10-03 DIAGNOSIS — J449 Chronic obstructive pulmonary disease, unspecified: Secondary | ICD-10-CM | POA: Diagnosis not present

## 2023-10-03 HISTORY — DX: Ocular pain, bilateral: H57.13

## 2023-10-03 LAB — BASIC METABOLIC PANEL WITH GFR
Anion gap: 9 (ref 5–15)
BUN: 26 mg/dL — ABNORMAL HIGH (ref 8–23)
CO2: 25 mmol/L (ref 22–32)
Calcium: 9.3 mg/dL (ref 8.9–10.3)
Chloride: 99 mmol/L (ref 98–111)
Creatinine, Ser: 1.39 mg/dL — ABNORMAL HIGH (ref 0.44–1.00)
GFR, Estimated: 40 mL/min — ABNORMAL LOW (ref >60)
Glucose, Bld: 98 mg/dL (ref 70–99)
Potassium: 3.6 mmol/L (ref 3.5–5.1)
Sodium: 133 mmol/L — ABNORMAL LOW (ref 135–145)

## 2023-10-03 LAB — CBC
HCT: 29.6 % — ABNORMAL LOW (ref 36.0–46.0)
Hemoglobin: 9 g/dL — ABNORMAL LOW (ref 12.0–15.0)
MCH: 27 pg (ref 26.0–34.0)
MCHC: 30.4 g/dL (ref 30.0–36.0)
MCV: 88.9 fL (ref 80.0–100.0)
Platelets: 344 10*3/uL (ref 150–400)
RBC: 3.33 MIL/uL — ABNORMAL LOW (ref 3.87–5.11)
RDW: 19.4 % — ABNORMAL HIGH (ref 11.5–15.5)
WBC: 7.8 10*3/uL (ref 4.0–10.5)
nRBC: 0 % (ref 0.0–0.2)

## 2023-10-03 LAB — COOXEMETRY PANEL
Carboxyhemoglobin: 2.2 % — ABNORMAL HIGH (ref 0.5–1.5)
Methemoglobin: 0.7 % (ref 0.0–1.5)
O2 Saturation: 67.5 %
Total hemoglobin: 9.6 g/dL — ABNORMAL LOW (ref 12.0–16.0)

## 2023-10-03 LAB — HEPARIN LEVEL (UNFRACTIONATED): Heparin Unfractionated: 0.56 [IU]/mL (ref 0.30–0.70)

## 2023-10-03 LAB — PROTIME-INR
INR: 1.5 — ABNORMAL HIGH (ref 0.8–1.2)
Prothrombin Time: 18.4 s — ABNORMAL HIGH (ref 11.4–15.2)

## 2023-10-03 LAB — MAGNESIUM: Magnesium: 1.9 mg/dL (ref 1.7–2.4)

## 2023-10-03 MED ORDER — WARFARIN SODIUM 7.5 MG PO TABS
7.5000 mg | ORAL_TABLET | Freq: Once | ORAL | Status: AC
  Administered 2023-10-03: 7.5 mg via ORAL
  Filled 2023-10-03: qty 1

## 2023-10-03 MED ORDER — FLUOCINONIDE EMULSIFIED BASE 0.05 % EX CREA
TOPICAL_CREAM | Freq: Two times a day (BID) | CUTANEOUS | Status: AC
Start: 2023-10-03 — End: 2023-10-03
  Filled 2023-10-03: qty 15

## 2023-10-03 NOTE — Progress Notes (Signed)
 Subjective Patient reports dyspnea has improved since yesterday She reports headaches that are not new for her but have become bothersome, they are located periorbitally and she notes it feels like there is "pressure" behind her eye and sinus pain. She also reports blurred vision. She also notes that yesterday when she tried standing up she felt dizzy. She denies dehydration. She has not ambulated today. She reports her R arm still feels sore as is the R side of her neck at the internal jugular site. She feels Norco is not lasting long enough to treat her pain. She Denies L arm pain. She denies R wrist pain.   Physical exam Blood pressure 131/88, pulse 93, temperature 98.1 F (36.7 C), temperature source Oral, resp. rate 20, height 5\' 5"  (1.651 m), weight 76.6 kg, SpO2 93%.  Well appearing woman lying in bed in no acute distress. She appears comfortable lying in bed but seems as if her eyes are irritated - B/l periorbital edema L > R, tenderness to palpation of eye lids b/l and decreased lateral peripheral vision b/l, no conjuctival injection - Central line in place, edema at the R side of neck consistent with previous neck DVT  - Left JVD appreciated  - Regular rate and rhythm, mitral valve click noted - Normal work of breathing, no crackles on auscultation or wheezes - RUE swelling with hematoma stable, soft to touch and warm, tender to palpation  - LUE swelling improved from yesterday, warm, not tender - B/l legs are wrapped in Umma boot, feet are warm, good capillary refill - Her back skin is flaky and dry, her R hand digits are more swollen than L in the setting of gout flare.    Weight change:    Intake/Output Summary (Last 24 hours) at 10/03/2023 0621 Last data filed at 10/03/2023 0519 Gross per 24 hour  Intake 1706.95 ml  Output 3200 ml  Net -1493.05 ml   Net IO Since Admission: -5,448.45 mL [10/03/23 0621]  Labs, images, and other studies    Latest Ref Rng  & Units 10/03/2023    5:05 AM 10/02/2023    5:21 AM 10/01/2023    9:05 AM  CBC  WBC 4.0 - 10.5 K/uL 7.8  8.3  6.1   Hemoglobin 12.0 - 15.0 g/dL 9.0  8.4  8.9   Hematocrit 36.0 - 46.0 % 29.6  27.7  29.4   Platelets 150 - 400 K/uL 344  315  316        Latest Ref Rng & Units 10/03/2023    5:05 AM 10/02/2023    5:21 AM 10/01/2023    9:05 AM  BMP  Glucose 70 - 99 mg/dL 98  161  096   BUN 8 - 23 mg/dL 26  31  31    Creatinine 0.44 - 1.00 mg/dL 0.45  4.09  8.11   Sodium 135 - 145 mmol/L 133  132  131   Potassium 3.5 - 5.1 mmol/L 3.6  3.3  3.7   Chloride 98 - 111 mmol/L 99  96  96   CO2 22 - 32 mmol/L 25  25  23    Calcium 8.9 - 10.3 mg/dL 9.3  8.4  8.2     CVP 22 Coox: 67.5 Magnesium: 1.8  Assessment and plan Hospital day 3  PEIGHTYN ROBERSON is a 73 y.o. person living with a history of A-fib, mitral valve replacement on warfarin,  COPD, CKD 3B, congestive heart failure with severely reduced right ventricular ejection fraction, and history of breast cancer with who presented with weight gain, shortness of breath, malaise and admitted for volume overload.   Heart failure with severely reduced right ventricular ejection fraction Low output heart failure HF following. Currently saturating within goal on home 2 L O2. Central line in place, measuring Co-ox 66--> 67.5, though CVP remained elevated at 22.  1.4L urine output in the last 24 hours with creatinine improving 1.97--> 1.39.  Overall she seems to be diuresing well, I have concern about dizziness which sounds like orthostatic hypotension, her BP is stable at 137/84, but is it possible she is dehydrated as she has lost a lot of weight recently. No LEE. Appreciate HF recommendations. - Continue Milrinone 0.25 mcg/kg/min, Lasix 160 BID, per heart failure - BP standing and laying down  - Co-ox, I/O, weights - CVP - BMP - Replete K and Mg PRN - Standing weights - Should now be using compression stockings - orthostatics    Mechanical mitral  valve A-fib INR remains subtherapeutic at 1.6. Continue Heparin per pharmacy and Coumadin. She will be abel to discontinue heparin once she is above 2.5 for a day or so.  B/l Eye pressure Headaches  Elevated TSH/T4 Pt reports a hx of headaches and had an episodes yesterday, but since yesterday had new periorbital edema and now reports pressure behind her eyes and blurred vision. On exam she has decreased temporal vision bilaterally, unsure what is baseline for her. She denies diplopia. At this time we have concern for a pituitary mass based mass effect symptoms and elevated TSH/T4.  Thyroid US on 4/11 showed 1 left superior lobe nodule that was 1-1.4 cm and multiple subcentimeter nodules bilaterally which did not meet TI RADS criteria for biopsy of dedicated F/U. This is less likely glaucoma due to lack of conjunctival injection and acute symptoms. Considering she is volume overloaded I also have concern for elevated fluid behind her eyes. She has also has R>L Cheek Swelling, though Parotid [place] on 4/11 was without abnormalities. At this time MRI to evaluate for potential pituitary mass is our recommendation.  - MRI brain wo contrast  - Prolactin pending  Polyarticular Gout Improving. She denies current symptoms but her RUE digits remain swollen. Consider renally dosed Allopurinol upon discharge. - Prednisone (day 3/5) - Continue Colchicine    Chronic neck, lower back pain Stable. She continues to feel Norco is not working long enough, she received 4 doses yesterday. On Norco 10/23/2023 every 3 hours as needed at home and gabapentin 200 mg 3 times daily.    LUE swelling Recent Right internal jugular DVT Asymmetric LUE swelling w/ recent DVT c/b history of LN dissection during breast cancer diagnosis and treatment in the LUE.  - LUE doppler pending  Back dryness -Started fluocinonide-emollient Topical BID    Chronic problems: Hyperlipidemia- Continue simvastatin 20 mg daily. LDL is 48.   Normocytic anemia- Stable. -Trend CBC Hypertension- Hold antihypertensives given soft BP and need for inotropic support. COPD- O2 at baseline 2L. Continue Breztri and NRT.    This is a Psychologist, occupational Note.  The care of the patient was discussed with Dr. Geraldo Pitter and the assessment and plan was formulated with their assistance.  Please see their note for official documentation of the patient encounter.   Signed: Rosana Fret, Medical Student 10/03/2023, 6:33 AM   Attestation for Student Documentation:  I personally was present and re-performed the history,  physical exam and medical decision-making activities of this service and have verified that the service and findings are accurately documented in the student's note.  Cleven [place], DO 10/04/2023, 6:19 AM

## 2023-10-03 NOTE — Hospital Course (Addendum)
 Acute on chronic R heart failure Pulmonary HTN, cor pulmonale Kim Anthony is a 73 year old with a history of A-fib, mitral valve replacement on warfarin, COPD, CKD 3B, congestive heart failure with severely reduced right ventricular ejection fraction, and history of breast cancer s/p L mastectomy who presented to us  with worsening dyspnea and volume overload 2/2 to acute right heart failure excerbation.  On presentation she was volume overloaded on exam and requiring 2 to 3 L of oxygen  through nasal cannula.  She was diuresed with IV Lasix  until her breathing and volume status improved.  She was only taking half of her torsemide  120 mg and there were concerns about her diet compliance.  The day of her discharge she was able to ambulate without oxygen  with saturation at 90%.  She thus not meet the requirement for oxygen  at home.  Patient was discharged home with home health, PT/OT/RN.  She will be on the following regimen per heart failure : -Take torsemide  80 mg daily -Take Farxiga  10 mg daily - Holding off spironolactone , losartan , in the setting of increased creatinine. - Holding off metoprolol  until follow-up. - Please restart this medications during follow-up, she does not have a follow-up with heart failure until May 2.   #AKI on CKD 3B Creatinine on admission of 2.67 with GFR 18 from baseline of 1.4-1.9. Her Scr improved to 1.92 on discharge.She was making urine and didn't endorse any urinary symptoms.  Please consider follow-up with a BMP.   Afib on [facility] Mechanical mitral Valve INR on presentation was 1.4, PT 17.6, subtherapeutic.  She is being discharged on 5 mg of warfarin a day.  She follows with Dr. Margit Shelling outpatient.   Polyarticular Gout 2/2 to CKD 3 She has a history of gout and recurrent flares, at this visit namely in her R wrist and L knee was started on dual colchicine  and prednisone .  Discharged with a prednisone  taper.  She should start allopurinol  at her follow-up appointment. -  Start on Allopurinol  (renally dosed)  Elevated TSH/T4, resolved periorbital headaches Kim Anthony complained of periorbital headaches and had hemianopsia on exam, however due to her reported kidney function we did not do an MRI.  ACTH  was within normal limits, prolactin was mildly elevated (38.4) but she does not have any signs of galactorrhea, and somatomedin c tests were also conducted and unremarkable.  Her TSH/T4 labs were increased.  TSH was 6.4, T4 was 1.32.  Please follow-up on this in clinic. - Repeat TSH/T4 labs outpatient if abnormal will order an MRI  Discharge Subjective: Kim Anthony was seen at bedside this morning. She was siting in chair. Reports her anxiety had gotten better with the hydroxyzine  and her breathing has improved as well.

## 2023-10-03 NOTE — Progress Notes (Signed)
 Rounding Note    Patient Name: Kim Anthony Date of Encounter: 10/03/2023  [facility] Cardiologist: Jules Oar, MD   Subjective   73 year old female with a history of acute on chronic diastolic congestive heart failure with significant RV dysfunction, COPD, obesity hypoventilation/obstructive sleep apnea  She has been seen by the CHF team.  She has been on IV milrinone  She has been somewhat resistant to IV Lasix.  She has been on high-dose Lasix-160 mg twice a day.  She has mechanical mitral valve.  Her INR is subtherapeutic ( 1.5 )  She has chronic kidney disease.  Creatinine has improved slightly to 1.39 today   She has diuresed 6.7 L so far during this hospitalization.  Her weight today is 73.9 kg  Inpatient Medications    Scheduled Meds:  budeson-glycopyrrolate-formoterol  2 puff Inhalation BID   colchicine  0.3 mg Oral Daily   gabapentin  200 mg Oral TID   nicotine  7 mg Transdermal Daily   predniSONE  20 mg Oral Q breakfast   simvastatin  20 mg Oral q1800   warfarin  7.5 mg Oral ONCE-1600   Warfarin - Pharmacist Dosing Inpatient   Does not apply q1600   Continuous Infusions:  furosemide 160 mg (10/03/23 0930)   heparin 1,650 Units/hr (10/03/23 0535)   milrinone 0.25 mcg/kg/min (10/03/23 0519)   PRN Meds: acetaminophen **OR** acetaminophen, albuterol, HYDROcodone-acetaminophen, hydrOXYzine, ipratropium-albuterol, senna-docusate   Vital Signs    Vitals:   10/03/23 0055 10/03/23 0500 10/03/23 0510 10/03/23 0757  BP: (!) 145/91  131/88 137/84  Pulse:   93 92  Resp: 15  20 17   Temp: 98 F (36.7 C)  98.1 F (36.7 C) (!) 97.5 F (36.4 C)  TempSrc: Oral  Oral Oral  SpO2: 93%  93% 97%  Weight:  73.9 kg    Height:        Intake/Output Summary (Last 24 hours) at 10/03/2023 1101 Last data filed at 10/03/2023 1034 Gross per 24 hour  Intake 1946.95 ml  Output 5200 ml  Net -3253.05 ml      10/03/2023    5:00 AM 10/02/2023    6:20 AM  10/01/2023    4:46 AM  Last 3 Weights  Weight (lbs) 163 lb 168 lb 12.8 oz 178 lb 5.6 oz  Weight (kg) 73.936 kg 76.567 kg 80.9 kg      Telemetry    Sinus rhythm   - Personally Reviewed  ECG     - Personally Reviewed  Physical Exam   Physical Exam: Blood pressure 137/84, pulse 92, temperature (!) 97.5 F (36.4 C), temperature source Oral, resp. rate 17, height 5\' 5"  (1.651 m), weight 73.9 kg, SpO2 97%.       GEN:  Well nourished, well developed in no acute distress HEENT: Normal NECK: No JVD; No carotid bruits LYMPHATICS: No lymphadenopathy CARDIAC: RRR , mechanical S1 RESPIRATORY:  Clear to auscultation without rales, wheezing or rhonchi  ABDOMEN: Soft, non-tender, non-distended MUSCULOSKELETAL:  No edema; No deformity  SKIN: Warm and dry NEUROLOGIC:  Alert and oriented x 3  Labs    High Sensitivity Troponin:   Recent Labs  Lab 09/19/23 2152 09/20/23 0116 09/20/23 0201 09/20/23 0409  TROPONINIHS 13 265* 14 12     Chemistry Recent Labs  Lab 09/30/23 0005 10/01/23 0905 10/02/23 0521 10/03/23 0505  NA 135 131* 132* 133*  K 3.5 3.7 3.3* 3.6  CL 100 96* 96* 99  CO2 25 23 25  25  GLUCOSE 77 155* 124* 98  BUN 25* 31* 31* 26*  CREATININE 1.91* 2.04* 1.97* 1.39*  CALCIUM 8.9 8.2* 8.4* 9.3  MG 2.0 2.0 2.0 1.9  PROT 6.8  --   --   --   ALBUMIN 2.9*  --   --   --   AST 17  --   --   --   ALT 13  --   --   --   ALKPHOS 129*  --   --   --   BILITOT 1.3*  --   --   --   GFRNONAA 27* 25* 26* 40*  ANIONGAP 10 12 11 9     Lipids  Recent Labs  Lab 09/30/23 0005  CHOL 100  TRIG 83  HDL 35*  LDLCALC 48  CHOLHDL 2.9    Hematology Recent Labs  Lab 10/01/23 0905 10/02/23 0521 10/03/23 0505  WBC 6.1 8.3 7.8  RBC 3.27* 3.12* 3.33*  HGB 8.9* 8.4* 9.0*  HCT 29.4* 27.7* 29.6*  MCV 89.9 88.8 88.9  MCH 27.2 26.9 27.0  MCHC 30.3 30.3 30.4  RDW 19.7* 19.6* 19.4*  PLT 316 315 344   Thyroid  Recent Labs  Lab 09/30/23 0005  TSH 6.433*  FREET4 1.32*     BNP Recent Labs  Lab 09/30/23 0005  BNP 723.7*    DDimer No results for input(s): "DDIMER" in the last 168 hours.   Radiology    IR Fluoro Guide CV Line Right Result Date: 10/01/2023 INDICATION: Heart failure and poor venous access. Patient needs central venous access while in the hospital. EXAM: 1. Right upper extremity venogram. 2. Failed placement of right upper extremity PICC line. 3. Placement of non tunneled central venous catheter using ultrasound and fluoroscopic guidance Physician: Olive Better. Julietta Ogren, MD FLUOROSCOPY TIME:  Radiation Exposure Index (as provided by the fluoroscopic device): 39.8 mGy Kerma MEDICATIONS: 1% lidocaine for local anesthetic ANESTHESIA/SEDATION: None PROCEDURE: PICC line placement was initially requested. The procedure was explained to the patient. The risks and benefits of the procedure were discussed and the patient's questions were addressed. Informed consent was obtained from the patient. Left upper extremity was not evaluated due to history of left mastectomy. Patent right brachial veins were identified with ultrasound and ultrasound images were saved for documentation. Right upper arm was prepped and draped in sterile fashion. Maximal barrier sterile technique was utilized including caps, mask, sterile gowns, sterile gloves, sterile drape, hand hygiene and skin antiseptic. Skin was anesthetized with 1% lidocaine. Using ultrasound guidance, a 21 gauge needle was directed into a brachial vein and wire was advanced into the right axillary region. Peel-away sheath was placed. Initially, wire would not advance centrally. Venogram images demonstrated complex venous drainage in the right upper extremity. A wire was advanced centrally. However, PICC catheter could not be advanced centrally. Eventually, this access was completely removed. Informed consent was obtained for placement of a non tunneled central venous catheter. The patient was placed supine on the interventional  table. Ultrasound confirmed a patent right internal jugularvein. Ultrasound images were obtained for documentation. The right neck was prepped and draped in a sterile fashion. Maximal barrier sterile technique was utilized including caps, mask, sterile gowns, sterile gloves, sterile drape, hand hygiene and skin antiseptic. The right neck was anesthetized with 1% lidocaine. A small incision was made with #11 blade scalpel. Using ultrasound guidance, needle was directed into the right internal jugular vein. Wire was advanced centrally. The tract was dilated to accommodate a 7 [place] triple-lumen  central venous catheter. Catheter tip was placed at the junction of the superior vena cava and right atrium. All 3 lumens aspirated and flushed well. Lumens were flushed with saline. Central line was sutured to skin. Fluoroscopic and ultrasound images were taken and saved for documentation. FINDINGS: 1. Complex venous drainage in the right upper extremity. Tortuous veins. PICC line could not be placed in the right upper extremity. 2. Right jugular central venous catheter was placed. Catheter tip at the superior cavoatrial junction. COMPLICATIONS: None IMPRESSION: 1. Failed attempt to place a right upper extremity PICC line. 2. Successful placement of a right jugular non tunneled central venous catheter using ultrasound and fluoroscopic guidance. Electronically Signed   By: Elene Griffes M.D.   On: 10/01/2023 17:44   IR US  Guide Vasc Access Right Result Date: 10/01/2023 INDICATION: Heart failure and poor venous access. Patient needs central venous access while in the hospital. EXAM: 1. Right upper extremity venogram. 2. Failed placement of right upper extremity PICC line. 3. Placement of non tunneled central venous catheter using ultrasound and fluoroscopic guidance Physician: Olive Better. Julietta Ogren, MD FLUOROSCOPY TIME:  Radiation Exposure Index (as provided by the fluoroscopic device): 39.8 mGy Kerma MEDICATIONS: 1% lidocaine for local  anesthetic ANESTHESIA/SEDATION: None PROCEDURE: PICC line placement was initially requested. The procedure was explained to the patient. The risks and benefits of the procedure were discussed and the patient's questions were addressed. Informed consent was obtained from the patient. Left upper extremity was not evaluated due to history of left mastectomy. Patent right brachial veins were identified with ultrasound and ultrasound images were saved for documentation. Right upper arm was prepped and draped in sterile fashion. Maximal barrier sterile technique was utilized including caps, mask, sterile gowns, sterile gloves, sterile drape, hand hygiene and skin antiseptic. Skin was anesthetized with 1% lidocaine. Using ultrasound guidance, a 21 gauge needle was directed into a brachial vein and wire was advanced into the right axillary region. Peel-away sheath was placed. Initially, wire would not advance centrally. Venogram images demonstrated complex venous drainage in the right upper extremity. A wire was advanced centrally. However, PICC catheter could not be advanced centrally. Eventually, this access was completely removed. Informed consent was obtained for placement of a non tunneled central venous catheter. The patient was placed supine on the interventional table. Ultrasound confirmed a patent right internal jugularvein. Ultrasound images were obtained for documentation. The right neck was prepped and draped in a sterile fashion. Maximal barrier sterile technique was utilized including caps, mask, sterile gowns, sterile gloves, sterile drape, hand hygiene and skin antiseptic. The right neck was anesthetized with 1% lidocaine. A small incision was made with #11 blade scalpel. Using ultrasound guidance, needle was directed into the right internal jugular vein. Wire was advanced centrally. The tract was dilated to accommodate a 7 French triple-lumen central venous catheter. Catheter tip was placed at the junction  of the superior vena cava and right atrium. All 3 lumens aspirated and flushed well. Lumens were flushed with saline. Central line was sutured to skin. Fluoroscopic and ultrasound images were taken and saved for documentation. FINDINGS: 1. Complex venous drainage in the right upper extremity. Tortuous veins. PICC line could not be placed in the right upper extremity. 2. Right jugular central venous catheter was placed. Catheter tip at the superior cavoatrial junction. COMPLICATIONS: None IMPRESSION: 1. Failed attempt to place a right upper extremity PICC line. 2. Successful placement of a right jugular non tunneled central venous catheter using ultrasound and fluoroscopic guidance. Electronically  Signed   By: Richarda Overlie M.D.   On: 10/01/2023 17:44   IR PICC PLACEMENT RIGHT >5 YRS INC IMG GUIDE Result Date: 10/01/2023 INDICATION: Heart failure and poor venous access. Patient needs central venous access while in the hospital. EXAM: 1. Right upper extremity venogram. 2. Failed placement of right upper extremity PICC line. 3. Placement of non tunneled central venous catheter using ultrasound and fluoroscopic guidance Physician: Rachelle Hora. Lowella Dandy, MD FLUOROSCOPY TIME:  Radiation Exposure Index (as provided by the fluoroscopic device): 39.8 mGy Kerma MEDICATIONS: 1% lidocaine for local anesthetic ANESTHESIA/SEDATION: None PROCEDURE: PICC line placement was initially requested. The procedure was explained to the patient. The risks and benefits of the procedure were discussed and the patient's questions were addressed. Informed consent was obtained from the patient. Left upper extremity was not evaluated due to history of left mastectomy. Patent right brachial veins were identified with ultrasound and ultrasound images were saved for documentation. Right upper arm was prepped and draped in sterile fashion. Maximal barrier sterile technique was utilized including caps, mask, sterile gowns, sterile gloves, sterile drape, hand  hygiene and skin antiseptic. Skin was anesthetized with 1% lidocaine. Using ultrasound guidance, a 21 gauge needle was directed into a brachial vein and wire was advanced into the right axillary region. Peel-away sheath was placed. Initially, wire would not advance centrally. Venogram images demonstrated complex venous drainage in the right upper extremity. A wire was advanced centrally. However, PICC catheter could not be advanced centrally. Eventually, this access was completely removed. Informed consent was obtained for placement of a non tunneled central venous catheter. The patient was placed supine on the interventional table. Ultrasound confirmed a patent right internal jugularvein. Ultrasound images were obtained for documentation. The right neck was prepped and draped in a sterile fashion. Maximal barrier sterile technique was utilized including caps, mask, sterile gowns, sterile gloves, sterile drape, hand hygiene and skin antiseptic. The right neck was anesthetized with 1% lidocaine. A small incision was made with #11 blade scalpel. Using ultrasound guidance, needle was directed into the right internal jugular vein. Wire was advanced centrally. The tract was dilated to accommodate a 7 French triple-lumen central venous catheter. Catheter tip was placed at the junction of the superior vena cava and right atrium. All 3 lumens aspirated and flushed well. Lumens were flushed with saline. Central line was sutured to skin. Fluoroscopic and ultrasound images were taken and saved for documentation. FINDINGS: 1. Complex venous drainage in the right upper extremity. Tortuous veins. PICC line could not be placed in the right upper extremity. 2. Right jugular central venous catheter was placed. Catheter tip at the superior cavoatrial junction. COMPLICATIONS: None IMPRESSION: 1. Failed attempt to place a right upper extremity PICC line. 2. Successful placement of a right jugular non tunneled central venous catheter  using ultrasound and fluoroscopic guidance. Electronically Signed   By: Richarda Overlie M.D.   On: 10/01/2023 17:44   US SOFT TISSUE HEAD & NECK (NON-THYROID) Result Date: 10/01/2023 CLINICAL DATA:  Neck swelling EXAM: ULTRASOUND OF HEAD/NECK SOFT TISSUES TECHNIQUE: Ultrasound examination of the head and neck soft tissues was performed in the area of clinical concern. COMPARISON:  None available FINDINGS: Sonographic evaluation of the parotid glands demonstrate no significant abnormality. No enlarged neck lymph nodes are identified. IMPRESSION: No significant sonographic abnormalities of the parotid glands. Electronically Signed   By: Acquanetta Belling M.D.   On: 10/01/2023 17:08   US THYROID Result Date: 10/01/2023 CLINICAL DATA:  Neck swelling.  Elevated  TSH. EXAM: THYROID ULTRASOUND TECHNIQUE: Ultrasound examination of the thyroid gland and adjacent soft tissues was performed. COMPARISON:  None available. FINDINGS: Parenchymal Echotexture: Mildly heterogenous Isthmus: 0.6 cm Right lobe: 3.5 x 2.5 x 1.7 cm Left lobe: 4.5 x 2.2 x 1.8 cm _________________________________________________________ Estimated total number of nodules >/= 1 cm: 1 Number of spongiform nodules >/=  2 cm not described below (TR1): 0 Number of mixed cystic and solid nodules >/= 1.5 cm not described below (TR2): 0 _________________________________________________________ Nodule # 1: Location: Left; superior Maximum size: 1.4 cm; Other 2 dimensions: 1.2 x 1.2 cm Composition: solid/almost completely solid (2) Echogenicity: isoechoic (1) Shape: not taller-than-wide (0) Margins: ill-defined (0) Echogenic foci: none (0) ACR TI-RADS total points: 3. ACR TI-RADS risk category: TR3 (3 points). ACR TI-RADS recommendations: Given size (<1.4 cm) and appearance, this nodule does NOT meet TI-RADS criteria for biopsy or dedicated follow-up. _________________________________________________________ Additional subcentimeter thyroid nodules do not meet criteria for  imaging follow-up. IMPRESSION: Bilateral thyroid nodules measuring up to 1.4 cm diameter criteria for FNA or imaging follow-up. The above is in keeping with the ACR TI-RADS recommendations - J Am Coll Radiol 2017;14:587-595. Electronically Signed   By: Elester Grim M.D.   On: 10/01/2023 17:07    Cardiac Studies      Patient Profile     73 y.o. female with BI-V CHF   Assessment & Plan     1.  Acute on chronic diastolic CHF with significant RV dysfunction: The patient has severe COPD.  She stopped smoking 1 month ago.  I suspect a lot of her right heart failure is due to severe COPD and subsequent pulmonary hypertension.  She continues to diurese.  Her baseline creatinine is now around 2. Continue current medications for now.  2.  Mechanical mitral valve.  Her INR is 1.6 today.  She is currently on heparin drip as well as warfarin since she is subtherapeutic.   3.  Coronary artery disease: She has a CTO of her right coronary artery.  She is not having any episodes of angina.         For questions or updates, please contact [facility] Please consult [email or web address] for contact info under        Signed, Ahmad Alert, MD  10/03/2023, 11:01 AM

## 2023-10-03 NOTE — Plan of Care (Signed)
  Problem: Education: Goal: Knowledge of General Education information will improve Description: Including pain rating scale, medication(s)/side effects and non-pharmacologic comfort measures 10/03/2023 2002 by Arlester Bence, RN Outcome: Progressing 10/03/2023 2002 by Arlester Bence, RN Outcome: Progressing

## 2023-10-03 NOTE — Plan of Care (Signed)

## 2023-10-03 NOTE — Plan of Care (Signed)
   Problem: Education: Goal: Knowledge of General Education information will improve Description Including pain rating scale, medication(s)/side effects and non-pharmacologic comfort measures Outcome: Progressing

## 2023-10-03 NOTE — Progress Notes (Signed)
 PHARMACY - ANTICOAGULATION CONSULT NOTE  Pharmacy Consult for Heparin and Coumadin Indication:  MVR  Allergies  Allergen Reactions   Lisinopril Swelling   Acetaminophen-Codeine Itching   Propoxyphene Itching    Darvocet   Tape Rash    Plastic   Tramadol Itching, Nausea And Vomiting and Rash    Patient Measurements: Height: 5\' 5"  (165.1 cm) Weight: 73.9 kg (163 lb) IBW/kg (Calculated) : 57 HEPARIN DW (KG): 73.1  Vital Signs: Temp: 98.1 F (36.7 C) (04/13 0510) Temp Source: Oral (04/13 0510) BP: 131/88 (04/13 0510) Pulse Rate: 93 (04/13 0510)  Labs: Recent Labs    10/01/23 0905 10/02/23 0521 10/02/23 1349 10/03/23 0505  HGB 8.9* 8.4*  --  9.0*  HCT 29.4* 27.7*  --  29.6*  PLT 316 315  --  344  APTT  --   --  152*  --   LABPROT 18.0* 19.0*  --  18.4*  INR 1.5* 1.6*  --  1.5*  HEPARINUNFRC 0.49 0.52  --  0.56  CREATININE 2.04* 1.97*  --  1.39*    Estimated Creatinine Clearance: 36.3 mL/min (A) (by C-G formula based on SCr of 1.39 mg/dL (H)).  Assessment: Pt with MVR who was readmitted for CHF exacerbation. Pharmacy consulted to dose warfarin. INR 1.4 on admission so we will bridge with heparin. Coumadin continued. Hemoglobin 9.5, platelet count 337.  PTA Coumadin regimen: 2.5 mg daily except 5 mg on Saturdays.  INR 1.5, subtherapeutic. Patient eating 75-100% meals. Heparin level is 0.56, therapeutic with infusion at 1650 units/hour. Hgb 9.0 low stable, plt 344 wnl. No signs or symptoms of bleeding or issues with the infusion.  Of note, patient taking PTA simvastatin which can interact with warfarin by increasing the INR and the risk of bleeding. Will continue monitor.  Goal of Therapy:  Heparin level 0.3-0.7 units/ml INR 2.5-3.5 Monitor platelets by anticoagulation protocol: Yes   Plan:  Continue heparin infusion at 1650 units/hr Warfarin 7.5 mg x 1 PO tonight Daily heparin level, PT/INR and CBC. Monitor for signs and symptoms of bleeding Monitor  appetite and for potential drug interactions  Jerrel Mor, PharmD PGY1 Pharmacy Resident 10/03/2023 7:04 AM

## 2023-10-04 ENCOUNTER — Inpatient Hospital Stay (HOSPITAL_COMMUNITY)

## 2023-10-04 DIAGNOSIS — Z952 Presence of prosthetic heart valve: Secondary | ICD-10-CM | POA: Diagnosis not present

## 2023-10-04 DIAGNOSIS — M7989 Other specified soft tissue disorders: Secondary | ICD-10-CM

## 2023-10-04 DIAGNOSIS — I110 Hypertensive heart disease with heart failure: Secondary | ICD-10-CM | POA: Diagnosis not present

## 2023-10-04 DIAGNOSIS — J449 Chronic obstructive pulmonary disease, unspecified: Secondary | ICD-10-CM | POA: Diagnosis not present

## 2023-10-04 DIAGNOSIS — I50810 Right heart failure, unspecified: Secondary | ICD-10-CM | POA: Diagnosis not present

## 2023-10-04 DIAGNOSIS — I50813 Acute on chronic right heart failure: Secondary | ICD-10-CM | POA: Diagnosis not present

## 2023-10-04 LAB — BASIC METABOLIC PANEL WITH GFR
Anion gap: 10 (ref 5–15)
BUN: 30 mg/dL — ABNORMAL HIGH (ref 8–23)
CO2: 27 mmol/L (ref 22–32)
Calcium: 9.4 mg/dL (ref 8.9–10.3)
Chloride: 96 mmol/L — ABNORMAL LOW (ref 98–111)
Creatinine, Ser: 1.43 mg/dL — ABNORMAL HIGH (ref 0.44–1.00)
GFR, Estimated: 39 mL/min — ABNORMAL LOW (ref >60)
Glucose, Bld: 98 mg/dL (ref 70–99)
Potassium: 3.5 mmol/L (ref 3.5–5.1)
Sodium: 133 mmol/L — ABNORMAL LOW (ref 135–145)

## 2023-10-04 LAB — CBC
HCT: 31.9 % — ABNORMAL LOW (ref 36.0–46.0)
Hemoglobin: 9.7 g/dL — ABNORMAL LOW (ref 12.0–15.0)
MCH: 26.9 pg (ref 26.0–34.0)
MCHC: 30.4 g/dL (ref 30.0–36.0)
MCV: 88.4 fL (ref 80.0–100.0)
Platelets: 377 10*3/uL (ref 150–400)
RBC: 3.61 MIL/uL — ABNORMAL LOW (ref 3.87–5.11)
RDW: 19.2 % — ABNORMAL HIGH (ref 11.5–15.5)
WBC: 9 10*3/uL (ref 4.0–10.5)
nRBC: 0 % (ref 0.0–0.2)

## 2023-10-04 LAB — COOXEMETRY PANEL
Carboxyhemoglobin: 2.1 % — ABNORMAL HIGH (ref 0.5–1.5)
Methemoglobin: 0.8 % (ref 0.0–1.5)
O2 Saturation: 63.1 %
Total hemoglobin: 10.3 g/dL — ABNORMAL LOW (ref 12.0–16.0)

## 2023-10-04 LAB — PROTIME-INR
INR: 1.8 — ABNORMAL HIGH (ref 0.8–1.2)
Prothrombin Time: 21.2 s — ABNORMAL HIGH (ref 11.4–15.2)

## 2023-10-04 LAB — PROLACTIN: Prolactin: 38.4 ng/mL — ABNORMAL HIGH (ref 3.6–25.2)

## 2023-10-04 LAB — HEPARIN LEVEL (UNFRACTIONATED): Heparin Unfractionated: 0.64 [IU]/mL (ref 0.30–0.70)

## 2023-10-04 LAB — MAGNESIUM: Magnesium: 1.9 mg/dL (ref 1.7–2.4)

## 2023-10-04 MED ORDER — VITAMIN D (ERGOCALCIFEROL) 1.25 MG (50000 UNIT) PO CAPS
50000.0000 [IU] | ORAL_CAPSULE | ORAL | Status: DC
  Administered 2023-10-04: 50000 [IU] via ORAL
  Filled 2023-10-04: qty 1

## 2023-10-04 MED ORDER — WARFARIN SODIUM 7.5 MG PO TABS
7.5000 mg | ORAL_TABLET | Freq: Once | ORAL | Status: AC
  Administered 2023-10-04: 7.5 mg via ORAL
  Filled 2023-10-04: qty 1

## 2023-10-04 MED ORDER — POTASSIUM CHLORIDE CRYS ER 20 MEQ PO TBCR
40.0000 meq | EXTENDED_RELEASE_TABLET | Freq: Four times a day (QID) | ORAL | Status: AC
Start: 2023-10-04 — End: 2023-10-04
  Administered 2023-10-04 (×2): 40 meq via ORAL
  Filled 2023-10-04: qty 4
  Filled 2023-10-04: qty 2

## 2023-10-04 MED ORDER — SPIRONOLACTONE 25 MG PO TABS
25.0000 mg | ORAL_TABLET | Freq: Every day | ORAL | Status: DC
  Administered 2023-10-04 – 2023-10-06 (×3): 25 mg via ORAL
  Filled 2023-10-04 (×3): qty 1

## 2023-10-04 NOTE — Progress Notes (Signed)
 Mobility Specialist Progress Note:   10/04/23 0940  Mobility  Activity Ambulated with assistance in hallway  Level of Assistance Contact guard assist, steadying assist  Assistive Device Four wheel walker  Distance Ambulated (ft) 150 ft  Activity Response Tolerated well  Mobility Referral Yes  Mobility visit 1 Mobility  Mobility Specialist Start Time (ACUTE ONLY) 0940  Mobility Specialist Stop Time (ACUTE ONLY) 1000  Mobility Specialist Time Calculation (min) (ACUTE ONLY) 20 min   Pt agreeable to mobility session. Required only minG assist throughout ambulation with rollator. No unsteadiness noted. VSS on RA. Back in chair with all needs met.   Kim Anthony Mobility Specialist Please contact via SecureChat or  Rehab office at [phone]

## 2023-10-04 NOTE — Plan of Care (Signed)
   Problem: Health Behavior/Discharge Planning: Goal: Ability to manage health-related needs will improve Outcome: Progressing

## 2023-10-04 NOTE — Progress Notes (Signed)
 Requested UNNA boots to be roved verified with MD that it was ok to remove.

## 2023-10-04 NOTE — Progress Notes (Signed)
 Physical Therapy Treatment Patient Details Name: Kim Anthony [name]: [id number] DOB: 1950-11-04 Today's Date: 10/04/2023   History of Present Illness Pt is a 73 y/o F presenting to ED on 4/9 with weight gain and SOB, LE edema. Admitted for CHF exacerbation. Doppler negative for DVT. PMH includes A fib, MVR of warfarin, COPD, CKD IIIB, CHF with severely reduced RV EF, prediabetes, breast CA    PT Comments  Patient seen for stair training. Placed on RA and able to tolerate up/down 4 steps with sats 94%. See vitals flowsheet for orthostatic BPs--pt denied dizziness with changes in position. Patient appropriate for discharge home from PT perspective.    If plan is discharge home, recommend the following: Assistance with cooking/housework;Assist for transportation;Help with stairs or ramp for entrance   Can travel by private vehicle     Yes  Equipment Recommendations  None recommended by PT    Recommendations for Other Services       Precautions / Restrictions Precautions Precautions: Fall Recall of Precautions/Restrictions: Impaired Precaution/Restrictions Comments: watch O2 Restrictions Weight Bearing Restrictions Per Provider Order: No     Mobility  Bed Mobility Overal bed mobility: Modified Independent Bed Mobility: Supine to Sit     Supine to sit: Modified independent (Device/Increase time)          Transfers Overall transfer level: Needs assistance Equipment used: Rollator (4 wheels) Transfers: Sit to/from Stand Sit to Stand: Contact guard assist           General transfer comment: no physical assist; vc for locking rollator prior to sitting    Ambulation/Gait                   Stairs Stairs: Yes Stairs assistance: Min assist Stair Management: One rail Right, Forwards (HHA on the left) Number of Stairs: 4 General stair comments: pt able to tolerate on room air; has bil rails at home, so HHA on left to simulate   Wheelchair Mobility     Tilt  Bed    Modified Rankin (Stroke Patients Only)       Balance Overall balance assessment: Needs assistance Sitting-balance support: Feet supported, No upper extremity supported Sitting balance-Leahy Scale: Good Sitting balance - Comments: reaches outside BOS without LOB   Standing balance support: During functional activity Standing balance-Leahy Scale: Fair                              Hotel manager: No apparent difficulties  Cognition Arousal: Alert Behavior During Therapy: WFL for tasks assessed/performed   PT - Cognitive impairments: Memory                         Following commands: Intact Following commands impaired: Only follows one step commands consistently    Cueing Cueing Techniques: Verbal cues, Tactile cues, Visual cues  Exercises      General Comments General comments (skin integrity, edema, etc.): on 2L O2 initially with sats 95%; on RA remained 90-95% throughout session; remained on RA at end of session with RN notified; see vitals flowsheet for orthostatic BPs      Pertinent Vitals/Pain Pain Assessment Pain Assessment: No/denies pain    Home Living                          Prior Function            PT  Goals (current goals can now be found in the care plan section) Acute Rehab PT Goals Patient Stated Goal: less pain Time For Goal Achievement: 10/14/23 Potential to Achieve Goals: Good Progress towards PT goals: Progressing toward goals    Frequency    Min 2X/week      PT Plan      Co-evaluation              AM-PAC PT "6 Clicks" Mobility   Outcome Measure  Help needed turning from your back to your side while in a flat bed without using bedrails?: A Little Help needed moving from lying on your back to sitting on the side of a flat bed without using bedrails?: A Little Help needed moving to and from a bed to a chair (including a wheelchair)?: A Little Help needed  standing up from a chair using your arms (e.g., wheelchair or bedside chair)?: A Little Help needed to walk in hospital room?: A Little Help needed climbing 3-5 steps with a railing? : A Little 6 Click Score: 18    End of Session Equipment Utilized During Treatment: Gait belt Activity Tolerance: Patient tolerated treatment well Patient left: in chair;with call bell/phone within reach Nurse Communication: Mobility status;Other (comment) (on RA) PT Visit Diagnosis: Unsteadiness on feet (R26.81);Other abnormalities of gait and mobility (R26.89);Muscle weakness (generalized) (M62.81)     Time: 1610-9604 PT Time Calculation (min) (ACUTE ONLY): 39 min  Charges:    $Gait Training: 38-52 mins PT General Charges $$ ACUTE PT VISIT: 1 Visit                      Gayle Kava, PT Acute Rehabilitation Services  Office [phone]    Guilford Leep 10/04/2023, 9:23 AM

## 2023-10-04 NOTE — Progress Notes (Cosign Needed Addendum)
 Subjective Today the patient reports her headaches and back pain are stable. She is low 90s on RA in the room. She feels overall the same as yesterday. PT was in the room at the time. They continue to recommend home health. Denies further blurred vision today.   Physical exam Blood pressure 128/77, pulse 95, temperature 97.8 F (36.6 C), resp. rate 20, height 5\' 5"  (1.651 m), weight 72.7 kg, SpO2 93%.   Well appearing woman lying in bed in no acute distress. She appears comfortable lying in bed but seems as if her eyes are irritated - Regular rate and rhythm, mitral valve click noted - Normal work of breathing, inspiratory wheezes at L lung base. No wheezes elsewhere  - RUE swelling and bruising improved since yesterday. R hand digits still swollen.  - LUE swelling stable - B/l legs are wrapped in Umma boot, feet are warm     Weight change: -1.236 kg   Intake/Output Summary (Last 24 hours) at 10/04/2023 1517 Last data filed at 10/04/2023 1515 Gross per 24 hour  Intake 1704.51 ml  Output 5550 ml  Net -3845.49 ml   Net IO Since Admission: -10,573.94 mL [10/04/23 1517]  Labs, images, and other studies    Latest Ref Rng & Units 10/04/2023    5:27 AM 10/03/2023    5:05 AM 10/02/2023    5:21 AM  CBC  WBC 4.0 - 10.5 K/uL 9.0  7.8  8.3   Hemoglobin 12.0 - 15.0 g/dL 9.7  9.0  8.4   Hematocrit 36.0 - 46.0 % 31.9  29.6  27.7   Platelets 150 - 400 K/uL 377  344  315        Latest Ref Rng & Units 10/04/2023    5:27 AM 10/03/2023    5:05 AM 10/02/2023    5:21 AM  BMP  Glucose 70 - 99 mg/dL 98  98  409   BUN 8 - 23 mg/dL 30  26  31    Creatinine 0.44 - 1.00 mg/dL 8.11  9.14  7.82   Sodium 135 - 145 mmol/L 133  133  132   Potassium 3.5 - 5.1 mmol/L 3.5  3.6  3.3   Chloride 98 - 111 mmol/L 96  99  96   CO2 22 - 32 mmol/L 27  25  25    Calcium 8.9 - 10.3 mg/dL 9.4  9.3  8.4     BP 956/21 lying HR: 92 lying, BP 120/78, HR 94 standing.   CVP 18 Coox: 63.1 Magnesium:  1.9  Assessment and plan Hospital day 4  Kim Anthony is a 73 y.o. person living with a history of A-fib, mitral valve replacement on warfarin, COPD, CKD 3B, congestive heart failure with severely reduced right ventricular ejection fraction, and history of breast cancer with who presented with weight gain, shortness of breath, malaise and admitted for volume overload.    Heart failure with severely reduced right ventricular ejection fraction Low output heart failure Currently saturating within goal on home 2 L O2. Improving. HF team following: they suspect right heart failure is due to severe COPD and subsequent pulmonary hypertension. Weight 163-->160 today. She has lost around 10 lbs since admission. BP stable. Overall she seems to be diuresing well, I have concern about dizziness which sounded like orthostatic hypotension, but orthostatics came back normal. No LEE. Appreciate HF recommendations. - Continue Milrinone 0.25 mcg/kg/min, Lasix 160  BID, per heart failure - BP standing and laying down  - Co-ox,  - strict I/O's - Standing weights - CVP - BMP - Replete K and Mg PRN - Should now be using compression stockings   B/l Eye pressure Headaches  Elevated TSH/T4 Pt reports a hx of headaches and had an episode 4/13, she reported pressure behind her eyes and blurred vision. She denied diplopia.Today she denies any further blurred vision. On 4/13 exam she has decreased temporal vision bilaterally, unsure what is baseline for her.  At this time we have concern for a pituitary mass based mass effect symptoms and elevated TSH/T4.  Thyroid US  on 4/11 showed 1 left superior lobe nodule that was 1-1.4 cm and multiple subcentimeter nodules bilaterally which did not meet TI RADS criteria for biopsy of dedicated F/U. This is less likely glaucoma due to lack of conjunctival injection and acute symptoms. Considering she is volume overloaded I also have concern for fluid behind her eyes. She has also has  R>L cheek swelling, though parotid US  on 4/11 was without abnormalities. At this time MRI cannot be performed as the patient is claustrophobic so we will order pituitary adenoma panel. - FSH - LH - ACTH - ILG-F - Prolactin pending   Mechanical mitral valve A-fib Hx of PE  INR improving but remains subtherapeutic at 1.5-->1.8. Continue Heparin per pharmacy and Coumadin. She will be abel to discontinue heparin once she is above 2.5 for a day or so.   Polyarticular Gout Improving. She denies current symptoms but her RUE digits remain swollen. Consider renally dosed Allopurinol upon discharge. - Prednisone (day 4/5) - Continue Colchicine add allopurinol for 3-6 months before discharge    Chronic neck, lower back pain Stable. She received 6 doses yesterday. On Norco 10/23/2023 every 3 hours as needed now. Gabapentin 200 mg 3 times daily.     LUE swelling Recent Right internal jugular DVT Asymmetric LUE swelling w/ recent DVT c/b history of LN dissection during breast cancer diagnosis and treatment in the LUE.  - LUE doppler pending   Osteoporosis  Vit D 2024 5.2 and had a R hip fracture 2024.  - high dose vit D 50k oral weekly for 8-12 weeks, Start on Bisphophonate after that   Normocytic Anemia Chronic. Hg improved 9.0-->9.7.  - B12 ordered   Back dryness -Started fluocinonide-emollient Topical BID, PRN   Chronic problems: Hyperlipidemia- Continue simvastatin 20 mg daily. LDL is 48.  Hypertension- Hold antihypertensives given soft BP and need for inotropic support. COPD- O2 at baseline 2L. Continue Breztri and NRT.    This is a Psychologist, occupational Note.  The care of the patient was discussed with Dr. Hayes Lipps and the assessment and plan was formulated with their assistance.  Please see their note for official documentation of the patient encounter.   Signed: Jere Monaco, Medical Student  Attestation for Student Documentation:  I personally was present and re-performed the  history, physical exam and medical decision-making activities of this service and have verified that the service and findings are accurately documented in the student's note.  Elih Mooney Alexander-Savino,MD  PGY-1 10/04/2023, 3:17 PM

## 2023-10-04 NOTE — Progress Notes (Signed)
 Advanced Heart Failure Rounding Note  Cardiologist: Arvilla Meres, MD  Chief Complaint: A/c RV failure  Subjective:    -Co-ox 63%. CVP >17. On Milrinone 0.25 -6.5L UOP (net negative ~5L although I/Os appear incomplete). Down 3lbs, 11lbs total.  -sCr improving 2.04>1.39>1.43  Feeling much better this morning, a little anxious. SOB improving. No CP, palpitations.   Objective:    Weight Range: 72.7 kg Body mass index is 26.67 kg/m.   Vital Signs:   Temp:  [97.5 F (36.4 C)-98.5 F (36.9 C)] 97.9 F (36.6 C) (04/14 0450) Pulse Rate:  [92-100] 99 (04/14 0450) Resp:  [15-21] 19 (04/14 0450) BP: (116-139)/(69-85) 134/81 (04/14 0450) SpO2:  [93 %-100 %] 99 % (04/14 0450) Weight:  [72.7 kg] 72.7 kg (04/14 0450) Last BM Date : 10/03/23  Weight change: Filed Weights   10/02/23 0620 10/03/23 0500 10/04/23 0450  Weight: 76.6 kg 73.9 kg 72.7 kg   Intake/Output:  Intake/Output Summary (Last 24 hours) at 10/04/2023 0716 Last data filed at 10/04/2023 0700 Gross per 24 hour  Intake 1431.45 ml  Output 6450 ml  Net -5018.55 ml    Physical Exam    CVP >17 General: Well appearing. No distress on [place] Cardiac: JVP to mid neck. S1 and S2 present. No murmurs or rub. Extremities: Warm and dry.  No peripheral edema. + UNNA Neuro: Alert and oriented x3. Affect pleasant. Moves all extremities without difficulty. Lines/Devices:  RIJ CVL     Telemetry   SR in 90s, 5PVC/min (personally reviewed)  EKG    N/A   Labs    CBC Recent Labs    10/03/23 0505 10/04/23 0527  WBC 7.8 9.0  HGB 9.0* 9.7*  HCT 29.6* 31.9*  MCV 88.9 88.4  PLT 344 377   Basic Metabolic Panel Recent Labs    16/10/96 0505 10/04/23 0527  NA 133* 133*  K 3.6 3.5  CL 99 96*  CO2 25 27  GLUCOSE 98 98  BUN 26* 30*  CREATININE 1.39* 1.43*  CALCIUM 9.3 9.4  MG 1.9 1.9   BNP: BNP (last 3 results) Recent Labs    08/28/23 0414 09/19/23 2151 09/30/23 0005  BNP 589.0* 900.4* 723.7*   Imaging    No results found.  Medications:    Scheduled Medications:  budeson-glycopyrrolate-formoterol  2 puff Inhalation BID   colchicine  0.3 mg Oral Daily   gabapentin  200 mg Oral TID   nicotine  7 mg Transdermal Daily   simvastatin  20 mg Oral q1800   Warfarin - Pharmacist Dosing Inpatient   Does not apply q1600   Infusions:  furosemide Stopped (10/03/23 2030)   heparin 1,650 Units/hr (10/04/23 0547)   milrinone 0.25 mcg/kg/min (10/04/23 0547)   PRN Medications: acetaminophen **OR** acetaminophen, albuterol, HYDROcodone-acetaminophen, hydrOXYzine, ipratropium-albuterol, senna-docusate  Patient Profile   Kim Anthony is a 73 y.o. female with a history of mechanical MVR in 2001 in [place], [place], CAD, hx breast cancer s/p left mastectomy and radiation/chemo, tobacco use, COPD, prior PE/DVT, HTN, chronic hypoxic respiratory failure (on home O2) and systolic RV failure   Assessment/Plan   1. Acute on chronic diastolic CHF with prominent RV dysfunction: COPD, OHS/OSA, and diastolic CHF from valvular disease likely play a role in the RV failure.  Last echo with EF 55-60% with D-shaped septum, severe RV dilation, severe RV dysfunction, mechanical MV with mean gradient 3 mmHg, severe TR. She has had multiple recent admissions.  She is markedly volume overloaded on exam, has required  milrinone in the past for RV support while diuresing. Suspect end stage RV failure. On Milrinone 0.25 mcg/kg/min. Unable to place PICC, RIJ CVL.  - Co-ox 63%. CVP >17. Continue milrinone 0.25 mcg/kg/min for RV support  - Continue IV Lasix 160 mg bid today - Unna boots.  - Hold Farxiga for now given purewick and risk for GU infection   2. Mechanical mitral valve: Stable on 3/25 echo.  INR 1.8. - Heparin gtt with INR <2 - Warfarin for goal INR 2.5-3.5.   3. Gout: Uric acid 16.  - Prednisone burst 20 mg daily x 3 days.  - Continue colchicine    4. CAD: CTO RCA on 7/22 cath.  - Continue statin.   5. Atrial  fibrillation/flutter: Paroxysmal. Maintaining NSR.  - Monitor while on milrinone  - heparin gtt  6. CKD stage 3:  - SCr 2.7 on admission. Creatinine has been trending down. 1.43 today. - will follow BMP closely    7. Tricuspid regurgitation: Severe in setting of RV failure.   Length of Stay: 4  [place] Secilia Apps, NP  10/04/2023, 7:16 AM  Advanced Heart Failure Team Pager [phone] (M-F; 7a - 5p)  Please contact [facility] for night-coverage after hours (5p -7a ) and weekends on [email or web address]

## 2023-10-04 NOTE — Progress Notes (Signed)
 PHARMACY - ANTICOAGULATION CONSULT NOTE  Pharmacy Consult for Heparin and Coumadin Indication:  MVR  Allergies  Allergen Reactions   Lisinopril Swelling   Acetaminophen-Codeine Itching   Propoxyphene Itching    Darvocet   Tape Rash    Plastic   Tramadol Itching, Nausea And Vomiting and Rash    Patient Measurements: Height: 5\' 5"  (165.1 cm) Weight: 72.7 kg (160 lb 4.4 oz) IBW/kg (Calculated) : 57 HEPARIN DW (KG): 73.1  Vital Signs: Temp: 97.5 F (36.4 C) (04/14 0732) Temp Source: Oral (04/14 0732) BP: 131/78 (04/14 0732) Pulse Rate: 89 (04/14 0732)  Labs: Recent Labs    10/02/23 0521 10/02/23 1349 10/03/23 0505 10/04/23 0527  HGB 8.4*  --  9.0* 9.7*  HCT 27.7*  --  29.6* 31.9*  PLT 315  --  344 377  APTT  --  152*  --   --   LABPROT 19.0*  --  18.4* 21.2*  INR 1.6*  --  1.5* 1.8*  HEPARINUNFRC 0.52  --  0.56 0.64  CREATININE 1.97*  --  1.39* 1.43*    Estimated Creatinine Clearance: 35 mL/min (A) (by C-G formula based on SCr of 1.43 mg/dL (H)).  Assessment: Pt with MVR who was readmitted for CHF exacerbation. Pharmacy consulted to dose warfarin. INR 1.4 on admission, bridged with IV heparin. Coumadin continued. Hemoglobin 9.5, platelet count 337.  PTA Coumadin regimen: 2.5 mg daily except 5 mg on Saturdays.  INR 1.8, subtherapeutic.  Heparin level is 0.64, therapeutic with infusion at 1650 units/hour. Hgb 9.7 low stable, plt 377. No signs or symptoms of bleeding or issues with the infusion.  Of note, patient taking PTA simvastatin which can interact with warfarin by increasing the INR and the risk of bleeding. Will continue monitor.  Goal of Therapy:  Heparin level 0.3-0.7 units/ml INR 2.5-3.5 Monitor platelets by anticoagulation protocol: Yes   Plan:  Continue heparin infusion at 1650 units/hr Warfarin 7.5 mg x 1 again tonight Daily heparin level, PT/INR and CBC. Monitor for signs and symptoms of bleeding  Joanell Mowers, Davey Erp,  [facility] Clinical Pharmacist  10/04/2023 8:16 AM   [facility] pharmacy phone numbers are listed on [email or web address]

## 2023-10-04 NOTE — Progress Notes (Signed)
 VASCULAR LAB    Left upper extremity venous duplex has been performed.  See CV proc for preliminary results.   Jhamari Markowicz, RVT 10/04/2023, 12:28 PM

## 2023-10-04 NOTE — Progress Notes (Signed)
 Occupational Therapy Treatment Patient Details Name: Kim Anthony [name]: [id number] DOB: 01/17/1951 Today's Date: 10/04/2023   History of present illness Pt is a 73 y/o F presenting to ED on 4/9 with weight gain and SOB, LE edema. Admitted for CHF exacerbation. Doppler negative for DVT. PMH includes A fib, MVR of warfarin, COPD, CKD IIIB, CHF with severely reduced RV EF, prediabetes, breast CA   OT comments  Pt wanting to put on panties and perform pericare. Required min assist. Ambulated with RW and CGA to sink to wash hands and returned to chair. VSS on RA. Pt admits to not adhering to low sodium diet and daily weights at home. Continue to recommend HHOT.      If plan is discharge home, recommend the following:  A little help with walking and/or transfers;A lot of help with bathing/dressing/bathroom;Assistance with cooking/housework;Direct supervision/assist for medications management;Direct supervision/assist for financial management;Assist for transportation;Help with stairs or ramp for entrance;Supervision due to cognitive status   Equipment Recommendations  None recommended by OT    Recommendations for Other Services      Precautions / Restrictions Precautions Precautions: Fall Recall of Precautions/Restrictions: Impaired Precaution/Restrictions Comments: watch O2 Restrictions Weight Bearing Restrictions Per Provider Order: No       Mobility Bed Mobility               General bed mobility comments: in chair    Transfers Overall transfer level: Needs assistance Equipment used: Rolling walker (2 wheels), None Transfers: Sit to/from Stand Sit to Stand: Contact guard assist                 Balance Overall balance assessment: Needs assistance Sitting-balance support: Feet supported, No upper extremity supported Sitting balance-Leahy Scale: Good     Standing balance support: During functional activity Standing balance-Leahy Scale: Fair Standing balance  comment: RW for ambulation, can stand statically during ADLs without LOB                           ADL either performed or assessed with clinical judgement   ADL Overall ADL's : Needs assistance/impaired     Grooming: Wash/dry hands;Standing               Lower Body Dressing: Minimal assistance;Sit to/from stand Lower Body Dressing Details (indicate cue type and reason): panties Toilet Transfer: Contact guard assist;Ambulation;Rolling walker (2 wheels)   Toileting- Clothing Manipulation and Hygiene: Minimal assistance;Sit to/from stand Toileting - Clothing Manipulation Details (indicate cue type and reason): pt completed pericare with assist to manage gown and panties     Functional mobility during ADLs: Contact guard assist;Rolling walker (2 wheels)      Extremity/Trunk Assessment              Vision       Perception     Praxis     Communication Communication Communication: No apparent difficulties   Cognition Arousal: Alert Behavior During Therapy: WFL for tasks assessed/performed Cognition: No family/caregiver present to determine baseline     Awareness: Online awareness impaired, Intellectual awareness intact   Attention impairment (select first level of impairment): Alternating attention Executive functioning impairment (select all impairments): Reasoning, Problem solving OT - Cognition Comments: Pt admits to not adhering to diet and daily weights.                 Following commands: Intact        Cueing      Exercises  Shoulder Instructions       General Comments     Pertinent Vitals/ Pain       Pain Assessment Pain Assessment: No/denies pain  Home Living                                          Prior Functioning/Environment              Frequency  Min 2X/week        Progress Toward Goals  OT Goals(current goals can now be found in the care plan section)  Progress towards OT  goals: Progressing toward goals  Acute Rehab OT Goals OT Goal Formulation: With patient Time For Goal Achievement: 10/14/23 Potential to Achieve Goals: Good  Plan      Co-evaluation                 AM-PAC OT "6 Clicks" Daily Activity     Outcome Measure   Help from another person eating meals?: None Help from another person taking care of personal grooming?: A Little Help from another person toileting, which includes using toliet, bedpan, or urinal?: A Little Help from another person bathing (including washing, rinsing, drying)?: A Lot Help from another person to put on and taking off regular upper body clothing?: A Little Help from another person to put on and taking off regular lower body clothing?: A Lot 6 Click Score: 17    End of Session Equipment Utilized During Treatment: Rolling walker (2 wheels)  OT Visit Diagnosis: Muscle weakness (generalized) (M62.81)   Activity Tolerance Patient tolerated treatment well   Patient Left in chair;with call bell/phone within reach (pharmacy in room)   Nurse Communication          Time: 4010-2725 OT Time Calculation (min): 28 min  Charges: OT General Charges $OT Visit: 1 Visit OT Treatments $Self Care/Home Management : 23-37 mins  Avanell Leigh, OTR/L Acute Rehabilitation Services Office: [phone]   Jonette Nestle 10/04/2023, 11:51 AM

## 2023-10-05 ENCOUNTER — Encounter (HOSPITAL_COMMUNITY): Payer: Self-pay

## 2023-10-05 DIAGNOSIS — I50813 Acute on chronic right heart failure: Secondary | ICD-10-CM | POA: Diagnosis not present

## 2023-10-05 HISTORY — PX: IR VENO/EXT/UNI RIGHT: IMG676

## 2023-10-05 LAB — BASIC METABOLIC PANEL WITH GFR
Anion gap: 11 (ref 5–15)
BUN: 32 mg/dL — ABNORMAL HIGH (ref 8–23)
CO2: 25 mmol/L (ref 22–32)
Calcium: 10.2 mg/dL (ref 8.9–10.3)
Chloride: 98 mmol/L (ref 98–111)
Creatinine, Ser: 1.47 mg/dL — ABNORMAL HIGH (ref 0.44–1.00)
GFR, Estimated: 37 mL/min — ABNORMAL LOW (ref >60)
Glucose, Bld: 92 mg/dL (ref 70–99)
Potassium: 3.7 mmol/L (ref 3.5–5.1)
Sodium: 134 mmol/L — ABNORMAL LOW (ref 135–145)

## 2023-10-05 LAB — HEPATIC FUNCTION PANEL
ALT: 21 U/L (ref 0–44)
AST: 39 U/L (ref 15–41)
Albumin: 3.3 g/dL — ABNORMAL LOW (ref 3.5–5.0)
Alkaline Phosphatase: 123 U/L (ref 38–126)
Bilirubin, Direct: 0.3 mg/dL — ABNORMAL HIGH (ref 0.0–0.2)
Indirect Bilirubin: 0.6 mg/dL (ref 0.3–0.9)
Total Bilirubin: 0.9 mg/dL (ref 0.0–1.2)
Total Protein: 7.6 g/dL (ref 6.5–8.1)

## 2023-10-05 LAB — CBC
HCT: 34.8 % — ABNORMAL LOW (ref 36.0–46.0)
Hemoglobin: 10.8 g/dL — ABNORMAL LOW (ref 12.0–15.0)
MCH: 27 pg (ref 26.0–34.0)
MCHC: 31 g/dL (ref 30.0–36.0)
MCV: 87 fL (ref 80.0–100.0)
Platelets: 431 10*3/uL — ABNORMAL HIGH (ref 150–400)
RBC: 4 MIL/uL (ref 3.87–5.11)
RDW: 19 % — ABNORMAL HIGH (ref 11.5–15.5)
WBC: 9.5 10*3/uL (ref 4.0–10.5)
nRBC: 0 % (ref 0.0–0.2)

## 2023-10-05 LAB — COOXEMETRY PANEL
Carboxyhemoglobin: 2 % — ABNORMAL HIGH (ref 0.5–1.5)
Carboxyhemoglobin: 1.8 % — ABNORMAL HIGH (ref 0.5–1.5)
Methemoglobin: <0.7 % (ref 0.0–1.5)
Methemoglobin: <0.7 % (ref 0.0–1.5)
O2 Saturation: 56.1 %
O2 Saturation: 56.2 %
Total hemoglobin: 11.4 g/dL — ABNORMAL LOW (ref 12.0–16.0)
Total hemoglobin: 11.2 g/dL — ABNORMAL LOW (ref 12.0–16.0)

## 2023-10-05 LAB — VITAMIN B12: Vitamin B-12: 373 pg/mL (ref 180–914)

## 2023-10-05 LAB — PROTIME-INR
INR: 2.3 — ABNORMAL HIGH (ref 0.8–1.2)
Prothrombin Time: 25.7 s — ABNORMAL HIGH (ref 11.4–15.2)

## 2023-10-05 LAB — HEPARIN LEVEL (UNFRACTIONATED): Heparin Unfractionated: 0.55 [IU]/mL (ref 0.30–0.70)

## 2023-10-05 LAB — MAGNESIUM: Magnesium: 2 mg/dL (ref 1.7–2.4)

## 2023-10-05 MED ORDER — POTASSIUM CHLORIDE CRYS ER 20 MEQ PO TBCR
40.0000 meq | EXTENDED_RELEASE_TABLET | Freq: Once | ORAL | Status: AC
  Administered 2023-10-05: 40 meq via ORAL
  Filled 2023-10-05: qty 2

## 2023-10-05 MED ORDER — HEPARIN (PORCINE) 25000 UT/250ML-% IV SOLN
1650.0000 [IU]/h | INTRAVENOUS | Status: DC
  Administered 2023-10-05 (×2): 1650 [IU]/h via INTRAVENOUS
  Filled 2023-10-05 (×2): qty 250

## 2023-10-05 MED ORDER — SODIUM CHLORIDE 0.9% FLUSH: 10.0000 mL | INTRAVENOUS | Status: DC | PRN

## 2023-10-05 MED ORDER — DAPAGLIFLOZIN PROPANEDIOL 10 MG PO TABS
10.0000 mg | ORAL_TABLET | Freq: Every day | ORAL | Status: DC
  Administered 2023-10-05 – 2023-10-08 (×4): 10 mg via ORAL
  Filled 2023-10-05 (×4): qty 1

## 2023-10-05 MED ORDER — WARFARIN SODIUM 5 MG PO TABS
5.0000 mg | ORAL_TABLET | Freq: Once | ORAL | Status: AC
  Administered 2023-10-05: 5 mg via ORAL
  Filled 2023-10-05: qty 1

## 2023-10-05 MED ORDER — LOSARTAN POTASSIUM 25 MG PO TABS
25.0000 mg | ORAL_TABLET | Freq: Every day | ORAL | Status: DC
  Administered 2023-10-05 – 2023-10-06 (×2): 25 mg via ORAL
  Filled 2023-10-05 (×2): qty 1

## 2023-10-05 MED ORDER — CHLORHEXIDINE GLUCONATE CLOTH 2 % EX PADS
6.0000 | MEDICATED_PAD | Freq: Every day | CUTANEOUS | Status: DC
  Administered 2023-10-05 – 2023-10-08 (×4): 6 via TOPICAL

## 2023-10-05 MED ORDER — TORSEMIDE 20 MG PO TABS
80.0000 mg | ORAL_TABLET | Freq: Every day | ORAL | Status: DC
  Administered 2023-10-05 – 2023-10-06 (×2): 80 mg via ORAL
  Filled 2023-10-05 (×2): qty 4

## 2023-10-05 MED ORDER — TORSEMIDE 20 MG PO TABS: 40.0000 mg | ORAL_TABLET | Freq: Two times a day (BID) | ORAL | Status: DC

## 2023-10-05 MED ORDER — SODIUM CHLORIDE 0.9% FLUSH
10.0000 mL | Freq: Two times a day (BID) | INTRAVENOUS | Status: DC
  Administered 2023-10-05 – 2023-10-08 (×7): 10 mL

## 2023-10-05 NOTE — Progress Notes (Cosign Needed Addendum)
 Subjective Today the patient reports her headaches and back pain are stable. She is low 90s on RA in the room. She feels "fantastic "she is amazed that she lost 20 pounds of fluid.  She feels like her legs are back to baseline.  She denies needing oxygen at home, and that she needed oxygen just because of this episode.  She is now on room air and is happy.   Physical exam Blood pressure 128/77, pulse 95, temperature 97.8 F (36.6 C), resp. rate 20, height 5\' 5"  (1.651 m), weight 72.7 kg, SpO2 93%.   Well appearing woman lying in bed in no acute distress. She appears comfortable sitting up in bed.  - Regular rate and rhythm, mitral valve click noted, no rubs or gallops.  No JVD, negative hepatojugular reflux. - Normal work of breathing, No wheezes, no crackles, no ronchi -No pitting edema on the bilateral lower extremities - RUE swelling and bruising improved since yesterday. R hand digits still swollen.  - LUE swelling stable - Abdomen is not distended, nontender to palpation even in the right upper quadrant.   Weight change: -3.8 kg   Intake/Output Summary (Last 24 hours) at 10/05/2023 1803 Last data filed at 10/05/2023 1650 Gross per 24 hour  Intake 659.08 ml  Output 1000 ml  Net -340.92 ml   Net IO Since Admission: -10,914.86 mL [10/05/23 1803]  Labs, images, and other studies    Latest Ref Rng & Units 10/05/2023    6:00 AM 10/04/2023    5:27 AM 10/03/2023    5:05 AM  CBC  WBC 4.0 - 10.5 K/uL 9.5  9.0  7.8   Hemoglobin 12.0 - 15.0 g/dL 16.1  9.7  9.0   Hematocrit 36.0 - 46.0 % 34.8  31.9  29.6   Platelets 150 - 400 K/uL 431  377  344        Latest Ref Rng & Units 10/05/2023    6:00 AM 10/04/2023    5:27 AM 10/03/2023    5:05 AM  BMP  Glucose 70 - 99 mg/dL 92  98  98   BUN 8 - 23 mg/dL 32  30  26   Creatinine 0.44 - 1.00 mg/dL 0.96  0.45  4.09   Sodium 135 - 145 mmol/L 134  133  133   Potassium 3.5 - 5.1 mmol/L 3.7  3.5  3.6   Chloride 98 - 111 mmol/L  98  96  99   CO2 22 - 32 mmol/L 25  27  25    Calcium 8.9 - 10.3 mg/dL 81.1  9.4  9.3     Assessment and plan Hospital day 5  Kim Anthony is a 73 y.o. person living with a history of A-fib, mitral valve replacement on warfarin, COPD, CKD 3B, congestive heart failure with severely reduced right ventricular ejection fraction, and history of breast cancer with who presented with weight gain, shortness of breath, malaise and admitted for volume overload.    Heart failure with severely reduced right ventricular ejection fraction Low output heart failure Patient appears euvolemic at this time.  Lungs are clear to auscultation, no JVD, no bilateral lower extremity edema.  She is saturating on room air.  She has lost about 20 pounds since admission.  She is feeling back at baseline.  Bicarb has been stable and not increasing.  Hepatic function test added on today to make sure there  is no hepatic congestion.  Appreciate heart failure recommendations. - Continue Milrinone 0.25 mcg/kg/min,  - Torsemide 80mg  daily per heart failure - BP standing and laying down  - Co-ox repeat in afternoon - strict I/O's - Standing weights - CVP - BMP - Replete K and Mg PRN - Should now be using compression stockings if LE edema  -re-start losartan and dapagliflozin   B/l Eye pressure Headaches  Elevated TSH/T4 She noticed improvement in the tenderness behind her eyes however these are still ongoing and are worse at night.  She says that maybe it is because she is watching TV.  It seems like it has improved as her edema has also improved.  Her hemianopia has also resolved. -Continue to monitor   Mechanical mitral valve A-fib Hx of PE  INR improving but remains subtherapeutic at 1.8--> 2.3 nearing therapeutic range. Continue Heparin per pharmacy and Coumadin. She will be abel to discontinue heparin once she is above 2.5 for a day or so.   Polyarticular Gout Improving. She denies current symptoms but her RUE  digits remain swollen. Consider renally dosed Allopurinol upon discharge. - Prednisone (day 4/5) - Continue Colchicine add allopurinol for 3-6 months before discharge    Chronic neck, lower back pain Stable.On Norco 10/23/2023 every 3 hours as needed now. Gabapentin 200 mg 3 times daily.     LUE swelling Recent Right internal jugular DVT Doppler showed evidence of DVT in LUE. Initially I thought she had swelling due to poor drainage, but she has acute superficial vein thrombosis involving the left cephalic vein, subtherapeutic INR.  Low likelihood of a thrombus with a superficial DVT.  Patient is currently undergoing anticoagulation.  Osteoporosis  Vit D 2024 5.2 and had a R hip fracture 2024.  - high dose vit D 50k oral weekly for 8 weeks, Start on Bisphophonate after that   Normocytic Anemia Chronic. - B12 pending   Back dryness -Started fluocinonide-emollient Topical BID, PRN   Chronic problems: Hyperlipidemia- Continue simvastatin 20 mg daily. LDL is 48.  Hypertension-can restart losartan and dapagliflozin and per heart failure. COPD-currently at baseline, room air. Continue Breztri and NRT.   This is a Psychologist, occupational Note.  The care of the patient was discussed with Dr. Alexander-Savino and the assessment and plan was formulated with their assistance.  Please see their note for official documentation of the patient encounter.   Signed: [name], Medical Student  Attestation for Student Documentation:  I personally was present and re-performed the history, physical exam and medical decision-making activities of this service and have verified that the service and findings are accurately documented in the student's note.  Kim Witts Alexander-Savino,MD  PGY-1 10/05/2023, 6:03 PM

## 2023-10-05 NOTE — Progress Notes (Signed)
 Advanced Heart Failure Rounding Note  Cardiologist: Arvilla Meres, MD  Chief Complaint: A/c RV failure  Subjective:    Coox 56%. CVP 8 2.1L + 2x occurrences yesterday with Lasix IV 160 bid. Weight down 8lbs (20 lbs total).   Sitting up in bed, eating breakfast. Feeling much better this morning. Has been ambulating easier and breathing better.   Objective:    Weight Range: 68.9 kg Body mass index is 25.28 kg/m.   Vital Signs:   Temp:  [97.5 F (36.4 C)-98.7 F (37.1 C)] 98.2 F (36.8 C) (04/15 0518) Pulse Rate:  [89-99] 92 (04/15 0518) Resp:  [16-22] 20 (04/15 0518) BP: (113-144)/(72-93) 128/74 (04/15 0518) SpO2:  [90 %-97 %] 90 % (04/15 0518) Weight:  [68.9 kg] 68.9 kg (04/15 0518) Last BM Date : 10/03/23  Weight change: Filed Weights   10/03/23 0500 10/04/23 0450 10/05/23 0518  Weight: 73.9 kg 72.7 kg 68.9 kg   Intake/Output:  Intake/Output Summary (Last 24 hours) at 10/05/2023 0714 Last data filed at 10/05/2023 0200 Gross per 24 hour  Intake 873.06 ml  Output 2100 ml  Net -1226.94 ml    Physical Exam    CVP 8 General: Elderly appearing. No distress on RA Cardiac: JVP ~8cm. S1 and S2 present. No murmurs or rub. Extremities: Warm and dry.  No peripheral edema.  Neuro: Alert and oriented x3. Affect pleasant. Moves all extremities without difficulty. Lines/Devices:  RIJ CVL     Telemetry   SR in 90s (personally reviewed)  EKG    N/A   Labs    CBC Recent Labs    10/04/23 0527 10/05/23 0600  WBC 9.0 9.5  HGB 9.7* 10.8*  HCT 31.9* 34.8*  MCV 88.4 87.0  PLT 377 431*   Basic Metabolic Panel Recent Labs    16/10/96 0505 10/04/23 0527  NA 133* 133*  K 3.6 3.5  CL 99 96*  CO2 25 27  GLUCOSE 98 98  BUN 26* 30*  CREATININE 1.39* 1.43*  CALCIUM 9.3 9.4  MG 1.9 1.9   BNP: BNP (last 3 results) Recent Labs    08/28/23 0414 09/19/23 2151 09/30/23 0005  BNP 589.0* 900.4* 723.7*   Imaging   VAS [place] UPPER EXTREMITY VENOUS  DUPLEX Result Date: 10/04/2023 UPPER VENOUS STUDY  Patient Name:  IBETH FAHMY  Date of Exam:   10/04/2023 Medical Rec #: [id number]      Accession #:    [id number] Date of Birth: 08-Oct-1950       Patient Gender: F Patient Age:   73 years Exam Location:  [facility] Procedure:      VAS [place] UPPER EXTREMITY VENOUS DUPLEX Referring Phys: Charissa Bash --------------------------------------------------------------------------------  Indications: Swelling, and History of breast cancer with dissection of left lymph node. Other Indications: Recent right IJV DVT found 08/31/2023. Comparison Study: No prior LUEV study on file. Performing Technologist: Sherren Kerns RVS  Examination Guidelines: A complete evaluation includes B-mode imaging, spectral Doppler, color Doppler, and power Doppler as needed of all accessible portions of each vessel. Bilateral testing is considered an integral part of a complete examination. Limited examinations for reoccurring indications may be performed as noted.  Right Findings: +----------+------------+---------+-----------+----------+-------+ RIGHT     CompressiblePhasicitySpontaneousPropertiesSummary +----------+------------+---------+-----------+----------+-------+ Subclavian               Yes       Yes                      +----------+------------+---------+-----------+----------+-------+  Left Findings: +----------+------------+---------+-----------+----------+-------+ LEFT      CompressiblePhasicitySpontaneousPropertiesSummary +----------+------------+---------+-----------+----------+-------+ IJV           Full       Yes       Yes                      +----------+------------+---------+-----------+----------+-------+ Subclavian               Yes       Yes                      +----------+------------+---------+-----------+----------+-------+ Axillary                 Yes       Yes                       +----------+------------+---------+-----------+----------+-------+ Brachial      Full                                          +----------+------------+---------+-----------+----------+-------+ Radial        Full                                          +----------+------------+---------+-----------+----------+-------+ Ulnar         Full                                          +----------+------------+---------+-----------+----------+-------+ Cephalic      None                                          +----------+------------+---------+-----------+----------+-------+ Basilic       Full                                          +----------+------------+---------+-----------+----------+-------+  Summary:  Right: No evidence of superficial vein thrombosis in the upper extremity.  Left: No evidence of deep vein thrombosis in the upper extremity. Findings consistent with acute superficial vein thrombosis involving the left cephalic vein.  *See table(s) above for measurements and observations.  Diagnosing physician: Jimmye Moulds MD Electronically signed by Jimmye Moulds MD on 10/04/2023 at 4:49:29 PM.    Final     Medications:    Scheduled Medications:  budeson-glycopyrrolate-formoterol  2 puff Inhalation BID   colchicine  0.3 mg Oral Daily   gabapentin  200 mg Oral TID   nicotine  7 mg Transdermal Daily   simvastatin  20 mg Oral q1800   spironolactone  25 mg Oral Daily   Vitamin D (Ergocalciferol)  50,000 Units Oral Q7 days   Warfarin - Pharmacist Dosing Inpatient   Does not apply q1600   Infusions:  furosemide 160 mg (10/04/23 1842)   heparin 1,650 Units/hr (10/04/23 1515)   milrinone 0.25 mcg/kg/min (10/04/23 1515)   PRN Medications: acetaminophen **OR** acetaminophen, albuterol, HYDROcodone-acetaminophen, hydrOXYzine, ipratropium-albuterol, senna-docusate  Patient Profile   Kim Anthony is a 73 y.o. female with a  history of mechanical MVR in 2001 in  [place], [place], CAD, hx breast cancer s/p left mastectomy and radiation/chemo, tobacco use, COPD, prior PE/DVT, HTN, chronic hypoxic respiratory failure (on home O2) and systolic RV failure   Assessment/Plan   1. Acute on chronic diastolic CHF with prominent RV dysfunction: COPD, OHS/OSA, and diastolic CHF from valvular disease likely play a role in the RV failure.  Last echo with EF 55-60% with D-shaped septum, severe RV dilation, severe RV dysfunction, mechanical MV with mean gradient 3 mmHg, severe TR. She has had multiple recent admissions.  She is markedly volume overloaded on exam, has required milrinone in the past for RV support while diuresing. Suspect end stage RV failure. On Milrinone 0.25 mcg/kg/min. Unable to place PICC, RIJ CVL.  - Co-ox 56%. CVP 8. Continue milrinone 0.25 mcg/kg/min for RV support. Repeat coox this morning, attempt to wean this afternoon.  - Stop IV Lasix. Start Torsemide 40 mg bid, follow response.  - Continue Spiro 25 mg daily  - Hold ? blocker with milrinone.  - Resume Losartan 25 mg daily  - Resume Farxiga 10 mg daily, now getting OOB  2. Mechanical mitral valve: Stable on 3/25 echo.  INR 2.3 - Likely can stop heparin tomorrow - Warfarin for goal INR 2.5-3.5. Dosing s/w PharmD  3. Gout: Uric acid 16. Pain resolved - Prednisone burst complete. - Continue colchicine    4. CAD: CTO RCA on 7/22 cath.  - Continue statin.   5. Atrial fibrillation/flutter: Paroxysmal. Maintaining NSR.  - Monitor while on milrinone   6. CKD stage 3:  - SCr 2.7 on admission. Creatinine has been trending down. Stable ~1.4. - will follow BMP closely    7. Tricuspid regurgitation: Severe in setting of RV failure.   Length of Stay: 5  [place] Jujhar Everett, NP  10/05/2023, 7:14 AM  Advanced Heart Failure Team Pager [phone] (M-F; 7a - 5p)  Please contact [facility] for night-coverage after hours (5p -7a ) and weekends on [email or web address]

## 2023-10-05 NOTE — Plan of Care (Signed)

## 2023-10-05 NOTE — Progress Notes (Signed)
 Mobility Specialist Progress Note:   10/05/23 0910  Mobility  Activity Ambulated with assistance in hallway  Level of Assistance Contact guard assist, steadying assist  Assistive Device Four wheel walker  Distance Ambulated (ft) 150 ft  Activity Response Tolerated well  Mobility Referral Yes  Mobility visit 1 Mobility  Mobility Specialist Start Time (ACUTE ONLY) 0910  Mobility Specialist Stop Time (ACUTE ONLY) 0925  Mobility Specialist Time Calculation (min) (ACUTE ONLY) 15 min   Pt agreeable to mobility session. Required only minG assist during ambulation with rollator. No c/o throughout. Pt left at sink to wash up. NT aware.   Oneda Big Mobility Specialist Please contact via SecureChat or  Rehab office at [phone]

## 2023-10-05 NOTE — Progress Notes (Signed)
 PHARMACY - ANTICOAGULATION CONSULT NOTE  Pharmacy Consult for Heparin and Coumadin Indication:  MVR  Allergies  Allergen Reactions   Lisinopril Swelling   Acetaminophen-Codeine Itching   Propoxyphene Itching    Darvocet   Tape Rash    Plastic   Tramadol Itching, Nausea And Vomiting and Rash    Patient Measurements: Height: 5\' 5"  (165.1 cm) Weight: 68.9 kg (151 lb 14.4 oz) IBW/kg (Calculated) : 57 HEPARIN DW (KG): 73.1  Vital Signs: Temp: 98.1 F (36.7 C) (04/15 0758) Temp Source: Oral (04/15 0758) BP: 113/69 (04/15 0758) Pulse Rate: 94 (04/15 0758)  Labs: Recent Labs    10/02/23 1349 10/03/23 0505 10/03/23 0505 10/04/23 0527 10/05/23 0600 10/05/23 0856  HGB  --  9.0*   < > 9.7* 10.8*  --   HCT  --  29.6*  --  31.9* 34.8*  --   PLT  --  344  --  377 431*  --   APTT 152*  --   --   --   --   --   LABPROT  --  18.4*  --  21.2* 25.7*  --   INR  --  1.5*  --  1.8* 2.3*  --   HEPARINUNFRC  --  0.56  --  0.64  --  0.55  CREATININE  --  1.39*  --  1.43* 1.47*  --    < > = values in this interval not displayed.    Estimated Creatinine Clearance: 33.3 mL/min (A) (by C-G formula based on SCr of 1.47 mg/dL (H)).  Assessment: Pt with MVR who was readmitted for CHF exacerbation. Pharmacy consulted to dose warfarin. INR 1.4 on admission, bridged with IV heparin. Coumadin continued. Hemoglobin 9.5, platelet count 337.  PTA Coumadin regimen: 2.5 mg daily except 5 mg on Saturdays.  INR 2.3, still slightly subtherapeutic since goal is 2.5-3.5  Heparin level is 0.55, therapeutic with infusion at 1650 units/hour. Hgb 9.7 low stable, plt 377. No signs or symptoms of bleeding or issues with the infusion.  Of note, patient taking PTA simvastatin which can interact with warfarin by increasing the INR and the risk of bleeding. Will continue monitor.  Goal of Therapy:  Heparin level 0.3-0.7 units/ml INR 2.5-3.5 Monitor platelets by anticoagulation protocol: Yes   Plan:   Continue heparin infusion at 1650 units/hr Warfarin 5 mg x 1 tonight Daily heparin level, PT/INR and CBC. Monitor for signs and symptoms of bleeding  Joanell Mowers, Davey Erp, [facility] Clinical Pharmacist  10/05/2023 11:30 AM   [facility] pharmacy phone numbers are listed on [email or web address]

## 2023-10-06 DIAGNOSIS — I480 Paroxysmal atrial fibrillation: Secondary | ICD-10-CM | POA: Diagnosis not present

## 2023-10-06 DIAGNOSIS — I3489 Other nonrheumatic mitral valve disorders: Secondary | ICD-10-CM

## 2023-10-06 DIAGNOSIS — I2510 Atherosclerotic heart disease of native coronary artery without angina pectoris: Secondary | ICD-10-CM | POA: Diagnosis not present

## 2023-10-06 DIAGNOSIS — I110 Hypertensive heart disease with heart failure: Secondary | ICD-10-CM | POA: Diagnosis not present

## 2023-10-06 DIAGNOSIS — I5033 Acute on chronic diastolic (congestive) heart failure: Secondary | ICD-10-CM | POA: Diagnosis not present

## 2023-10-06 DIAGNOSIS — J449 Chronic obstructive pulmonary disease, unspecified: Secondary | ICD-10-CM | POA: Diagnosis not present

## 2023-10-06 DIAGNOSIS — I50810 Right heart failure, unspecified: Secondary | ICD-10-CM | POA: Diagnosis not present

## 2023-10-06 DIAGNOSIS — Z952 Presence of prosthetic heart valve: Secondary | ICD-10-CM | POA: Diagnosis not present

## 2023-10-06 LAB — LUTEINIZING HORMONE: LH: 6.2 m[IU]/mL — ABNORMAL LOW (ref 7.7–58.5)

## 2023-10-06 LAB — CBC
HCT: 37 % (ref 36.0–46.0)
Hemoglobin: 11.5 g/dL — ABNORMAL LOW (ref 12.0–15.0)
MCH: 27.3 pg (ref 26.0–34.0)
MCHC: 31.1 g/dL (ref 30.0–36.0)
MCV: 87.7 fL (ref 80.0–100.0)
Platelets: 453 10*3/uL — ABNORMAL HIGH (ref 150–400)
RBC: 4.22 MIL/uL (ref 3.87–5.11)
RDW: 19.4 % — ABNORMAL HIGH (ref 11.5–15.5)
WBC: 9.5 10*3/uL (ref 4.0–10.5)
nRBC: 0 % (ref 0.0–0.2)

## 2023-10-06 LAB — COMPREHENSIVE METABOLIC PANEL WITH GFR
ALT: 24 U/L (ref 0–44)
AST: 36 U/L (ref 15–41)
Albumin: 3.3 g/dL — ABNORMAL LOW (ref 3.5–5.0)
Alkaline Phosphatase: 129 U/L — ABNORMAL HIGH (ref 38–126)
Anion gap: 9 (ref 5–15)
BUN: 42 mg/dL — ABNORMAL HIGH (ref 8–23)
CO2: 24 mmol/L (ref 22–32)
Calcium: 9.7 mg/dL (ref 8.9–10.3)
Chloride: 98 mmol/L (ref 98–111)
Creatinine, Ser: 1.73 mg/dL — ABNORMAL HIGH (ref 0.44–1.00)
GFR, Estimated: 31 mL/min — ABNORMAL LOW (ref >60)
Glucose, Bld: 115 mg/dL — ABNORMAL HIGH (ref 70–99)
Potassium: 4.2 mmol/L (ref 3.5–5.1)
Sodium: 131 mmol/L — ABNORMAL LOW (ref 135–145)
Total Bilirubin: 0.7 mg/dL (ref 0.0–1.2)
Total Protein: 7.4 g/dL (ref 6.5–8.1)

## 2023-10-06 LAB — COOXEMETRY PANEL
Carboxyhemoglobin: 2.1 % — ABNORMAL HIGH (ref 0.5–1.5)
Carboxyhemoglobin: 2.1 % — ABNORMAL HIGH (ref 0.5–1.5)
Methemoglobin: <0.7 % (ref 0.0–1.5)
Methemoglobin: <0.7 % (ref 0.0–1.5)
O2 Saturation: 55.5 %
O2 Saturation: 59.2 %
Total hemoglobin: 12.3 g/dL (ref 12.0–16.0)
Total hemoglobin: 11 g/dL — ABNORMAL LOW (ref 12.0–16.0)

## 2023-10-06 LAB — FOLLICLE STIMULATING HORMONE: FSH: 10 m[IU]/mL — ABNORMAL LOW (ref 25.8–134.8)

## 2023-10-06 LAB — PROTIME-INR
INR: 2.9 — ABNORMAL HIGH (ref 0.8–1.2)
Prothrombin Time: 30.9 s — ABNORMAL HIGH (ref 11.4–15.2)

## 2023-10-06 LAB — INSULIN-LIKE GROWTH FACTOR: Somatomedin C: 146 ng/mL (ref 48–191)

## 2023-10-06 LAB — MAGNESIUM: Magnesium: 2.1 mg/dL (ref 1.7–2.4)

## 2023-10-06 LAB — ACTH: C206 ACTH: 8.7 pg/mL (ref 7.2–63.3)

## 2023-10-06 LAB — HEPARIN LEVEL (UNFRACTIONATED): Heparin Unfractionated: 0.8 [IU]/mL — ABNORMAL HIGH (ref 0.30–0.70)

## 2023-10-06 MED ORDER — PREDNISONE 20 MG PO TABS
20.0000 mg | ORAL_TABLET | Freq: Every day | ORAL | Status: DC
  Administered 2023-10-06 – 2023-10-08 (×3): 20 mg via ORAL
  Filled 2023-10-06 (×3): qty 1

## 2023-10-06 MED ORDER — WARFARIN SODIUM 5 MG PO TABS
5.0000 mg | ORAL_TABLET | Freq: Once | ORAL | Status: AC
  Administered 2023-10-06: 5 mg via ORAL
  Filled 2023-10-06: qty 1

## 2023-10-06 MED ORDER — MILRINONE LACTATE IN DEXTROSE 20-5 MG/100ML-% IV SOLN
0.1250 ug/kg/min | INTRAVENOUS | Status: AC
  Administered 2023-10-06: 0.125 ug/kg/min via INTRAVENOUS
  Filled 2023-10-06: qty 100

## 2023-10-06 NOTE — Progress Notes (Signed)
 Physical Therapy Treatment Patient Details Name: Kim Anthony [name]: [id number] DOB: 1951/02/15 Today's Date: 10/06/2023   History of Present Illness Pt is a 73 y/o F presenting to ED on 4/9 with weight gain and SOB, LE edema. Admitted for CHF exacerbation. Doppler negative for DVT. PMH includes A fib, MVR of warfarin, COPD, CKD IIIB, CHF with severely reduced RV EF, prediabetes, breast CA    PT Comments  Pt greeted seated EOB, pleasant and agreeable to PT session. She is making steady progress towards her acute PT goals. Pt increased gait distance, ambulating ~161ft using IV pole with supervision and one seated rest break. She ascended/descended 3 steps with BHR and CGA three times to simulate entering her home. Will continue to follow acutely and advance appropriately.    If plan is discharge home, recommend the following: Assistance with cooking/housework;Assist for transportation;Help with stairs or ramp for entrance;A little help with walking and/or transfers   Can travel by private vehicle     Yes  Equipment Recommendations  None recommended by PT    Recommendations for Other Services       Precautions / Restrictions Precautions Precautions: Fall Recall of Precautions/Restrictions: Intact Restrictions Weight Bearing Restrictions Per Provider Order: No     Mobility  Bed Mobility               General bed mobility comments: Not assessed. Pt greeted seated EOB and returned there at end of session.    Transfers Overall transfer level: Needs assistance Equipment used: None Transfers: Sit to/from Stand Sit to Stand: Supervision           General transfer comment: Pt stood from lowest bed height by pushing up with BUE support. She achieve fully erect posture. Good eccentric control with sitting.    Ambulation/Gait Ambulation/Gait assistance: Supervision Gait Distance (Feet): 150 Feet (1x150, seated rest, 1x150) Assistive device: IV Pole Gait Pattern/deviations:  Step-through pattern, Decreased stride length Gait velocity: decreased Gait velocity interpretation: <1.8 ft/sec, indicate of risk for recurrent falls   General Gait Details: Pt ambulated with a slow and steady reciprocal gait pattern. She maintained RUE support on IV pole. Intermittent VC for navigating obstacles in room/hallway with IV pole. She displayed even weight shift and good foot clearence. Pt required a seated rest break d/t fatigue. No LOB.   Stairs Stairs: Yes Stairs assistance: Contact guard assist Stair Management: Two rails, Forwards, Step to pattern Number of Stairs: 3 (x3) General stair comments: Number of steps limited by IV line/pole. Pt has 8 STE at home with BHR. She ascended leading with RLE and descended with LLE. No LOB when turning around on the 3rd step.   Wheelchair Mobility     Tilt Bed    Modified Rankin (Stroke Patients Only)       Balance Overall balance assessment: Mild deficits observed, not formally tested                                          Communication Communication Communication: No apparent difficulties  Cognition Arousal: Alert Behavior During Therapy: WFL for tasks assessed/performed   PT - Cognitive impairments: No apparent impairments                         Following commands: Intact      Cueing Cueing Techniques: Verbal cues  Exercises  General Comments General comments (skin integrity, edema, etc.): VSS on RA      Pertinent Vitals/Pain Pain Assessment Pain Assessment: No/denies pain    Home Living                          Prior Function            PT Goals (current goals can now be found in the care plan section) Acute Rehab PT Goals Patient Stated Goal: Go Home Progress towards PT goals: Progressing toward goals    Frequency    Min 2X/week      PT Plan      Co-evaluation              AM-PAC PT "6 Clicks" Mobility   Outcome Measure  Help  needed turning from your back to your side while in a flat bed without using bedrails?: A Little Help needed moving from lying on your back to sitting on the side of a flat bed without using bedrails?: A Little Help needed moving to and from a bed to a chair (including a wheelchair)?: A Little Help needed standing up from a chair using your arms (e.g., wheelchair or bedside chair)?: A Little Help needed to walk in hospital room?: A Little Help needed climbing 3-5 steps with a railing? : A Little 6 Click Score: 18    End of Session Equipment Utilized During Treatment: Gait belt Activity Tolerance: Patient tolerated treatment well Patient left: in bed;with call bell/phone within reach Nurse Communication: Mobility status PT Visit Diagnosis: Unsteadiness on feet (R26.81);Other abnormalities of gait and mobility (R26.89);Muscle weakness (generalized) (M62.81)     Time: 1308-6578 PT Time Calculation (min) (ACUTE ONLY): 21 min  Charges:    $Gait Training: 8-22 mins PT General Charges $$ ACUTE PT VISIT: 1 Visit                     Glenford Lanes, PT, DPT Acute Rehabilitation Services Office: [phone] Secure Chat Preferred  Riva Chester 10/06/2023, 1:26 PM

## 2023-10-06 NOTE — Progress Notes (Addendum)
 Subjective She continues to do well. She is stable other than her wrist that she says are beginning to feel "stiff" again after she d/c prednisone yesterday. In the room she was saturating well on RA. She says she was able to walk with PT yesterday and felt good during it, she needs minimal assistance with walking. Denies any current headaches, periorbital edema, or galactorrhea. She denies blurred vision.   HF is weaning milrinone down to 0.125  today due to improvement in her heart and possibly planning to discontinue it later this evening. They believe she is ready to discharge tomorrow.    Physical exam Blood pressure 128/77, pulse 95, temperature 97.8 F (36.6 C), resp. rate 20, height 5\' 5"  (1.651 m), weight 72.7 kg, SpO2 93%.   General: She is laying down comfortably on the bed, a little sleepy when we walked in but quickly became alert. Her periorbital edema and facial swelling have improved, she appears near baseline. Cardio: No JVD on exam, negative hepatojugular reflex regular rate and rhythm with mitral valve click noted, no LE, b/l legs are warm and dorsalis pedis pulses palpable bilaterally Abdomen: soft and nontender  Pulm: Lungs are clear to auscultation in all lung fields, she is breathing comfortably, no wheezing or crackles Extremities: R wrist swelling has improved, ROM worse in the R fingers. Her fingers appear equal in size bilaterally.  She is moving her L knee comfortable and the swelling has gone down. Sweling in LUE is stable, RUE is stable, no new brusiing   Weight change: 0.4 kg   Intake/Output Summary (Last 24 hours) at 10/06/2023 1841 Last data filed at 10/06/2023 1200 Gross per 24 hour  Intake 960 ml  Output 2650 ml  Net -1690 ml   Net IO Since Admission: -12,604.86 mL [10/06/23 1841]  Labs, images, and other studies    Latest Ref Rng & Units 10/06/2023    5:00 AM 10/05/2023    6:00 AM 10/04/2023    5:27 AM  CBC  WBC 4.0 - 10.5 K/uL 9.5   9.5  9.0   Hemoglobin 12.0 - 15.0 g/dL 16.1  09.6  9.7   Hematocrit 36.0 - 46.0 % 37.0  34.8  31.9   Platelets 150 - 400 K/uL 453  431  377        Latest Ref Rng & Units 10/06/2023    5:00 AM 10/05/2023    6:00 AM 10/04/2023    5:27 AM  CMP  Glucose 70 - 99 mg/dL 045  92  98   BUN 8 - 23 mg/dL 42  32  30   Creatinine 0.44 - 1.00 mg/dL 4.09  8.11  9.14   Sodium 135 - 145 mmol/L 131  134  133   Potassium 3.5 - 5.1 mmol/L 4.2  3.7  3.5   Chloride 98 - 111 mmol/L 98  98  96   CO2 22 - 32 mmol/L 24  25  27    Calcium 8.9 - 10.3 mg/dL 9.7  78.2  9.4   Total Protein 6.5 - 8.1 g/dL 7.4  7.6    Total Bilirubin 0.0 - 1.2 mg/dL 0.7  0.9    Alkaline Phos 38 - 126 U/L 129  123    AST 15 - 41 U/L 36  39    ALT 0 - 44 U/L 24  21      Magnesium 2.1 Coox 55.5-->59.2 CVP  7-->9  Assessment and plan Hospital day 6  Kim Anthony is a 73 y.o. person living with a history of A-fib, mitral valve replacement on warfarin, COPD, CKD 3B, congestive heart failure with severely reduced right ventricular ejection fraction, and history of breast cancer with who presented with weight gain, shortness of breath, malaise and admitted for acute on chronic right heart failure.    Heart failure with severely reduced right ventricular ejection fraction Low output heart failure Stable. She is breathing well and ambulating with minimal assistance on RA. She continues to appear euvolemic due to exam today, lungs remain clear, no JVD, no LEE, and weight is stable. Her CVP is 9, coinciding with clinical signs of decreased preload. Her bicarbonate remains wnl from CMP yesterday She has continues to diurese well. However, after switching from IV lasix 160mg  to torsemide 80mg  her Cr has worsened 1.47-->1.73, will continue to monitor as we will need to cut back on diuretics.  Appreciate HF recommendations. HF has weaned milrinone and might discontinue it today. - Milrinone down to 0.125, may be discontinued by HF this  afternoon - Torsemide 80mg  daily  - Co-ox repeat  - Strict I/O's - Standing weights - CVP - BMP - Losartan 25mg  and dapagliflozin 10mg   B/l Eye pressure, resolved Headaches, resolved Elevated TSH/T4 No current headaches or vision is stable since yesterday without hemianopsia. Her prolactin was elevated at 38, but she does not have signs of hyperprolactinemia at this time. Prolactin may also be elevated in people with CKD. Her ILG-F was normal, more reassuring that she does not have a pituitary adenoma, At this time we think its best for her to repeat TSH/T4 labs outpatient. Unsure what was cause her acute headaches and pressure behind her eyes with blurred vision, but more likely were due to volume overload.  - Repeat TSH/T4 outpatient, consider MRI based on results   Mechanical mitral valve A-fib Hx of PE  Acute cephalic vein thrombosis, left arm INR improving now in therapeutic range at 2.9 and heparin was discontinued. She is now in good range for her chronic conditions.  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 The stiffness in her hands has returned, likely due to d/c prednisone yesterday. Her RUE digits looks less swollen, no signs of tophi. Her Cr has increased, possibly and could be contributing to acute build up of uric acid causing symptoms but less likely in the setting of recent medication changes. May consider restarting prednisone taper. Still likely to consider renally dosed Allopurinol upon discharge for prophylaxis considering her gout flares continue to reaccur. - Extend Prednisone 20mg , will do taper outpatient - Continue Colchicine and will add allopurinol upon discharge for 3-6 months followed by allopurinol alone if no interim acute flares.   Chronic neck, lower back pain Stable. On Norco and Gabapentin 200 mg 3 times daily.     Osteoporosis  Vit D 2024 5.2 and had a R hip fracture 2024.  - high dose vit D 50k oral weekly for 8  weeks, then recheck. Start on Bisphophonate at end of 8 weeks.   Normocytic Anemia Chronic. Her Hg is improving 10.8--> 11.5. She may benefit from iron supplementation outpatient though this can cause constipation. - CBC trending   Back dryness -cw fluocinonide-emollient Topical BID, PRN  Facial edema Her periorbital edema has resolved and her facial swelling has improved likely because she is now euvolemic.   Chronic problems: Hyperlipidemia- Continue simvastatin 20 mg daily. Hypertension-  losartan and dapagliflozin per heart failure. COPD- Currently at baseline on room air. Continue Breztri and NRT.   Code: Full PTOT rec: Home Health  Discharge: Possibly tomorrow   This is a Psychologist, occupational Note.  The care of the patient was discussed with Dr. Alexander-Savino and the assessment and plan was formulated with their assistance.  Please see their note for official documentation of the patient encounter.   Signed: Jere Monaco, Medical Student  Attestation for Student Documentation:  I personally was present and re-performed the history, physical exam and medical decision-making activities of this service and have verified that the service and findings are accurately documented in the student's note.  Dwan Fennel Alexander-Savino,MD  PGY-1 10/06/2023, 6:41 PM

## 2023-10-06 NOTE — Progress Notes (Signed)
 PHARMACY - ANTICOAGULATION CONSULT NOTE  Pharmacy Consult for warfarin management Indication:  Mitral valve INR target range 2.5 - 3.5  Allergies  Allergen Reactions   Lisinopril Swelling   Acetaminophen-Codeine Itching   Propoxyphene Itching    Darvocet   Tape Rash    Plastic   Tramadol Itching, Nausea And Vomiting and Rash    Patient Measurements: Height: 5\' 5"  (165.1 cm) Weight: 69.3 kg (152 lb 12.5 oz) IBW/kg (Calculated) : 57 HEPARIN DW (KG): 73.1  Vital Signs: Temp: 98 F (36.7 C) (04/16 0417) Temp Source: Oral (04/16 0417) BP: 116/67 (04/16 0417) Pulse Rate: 92 (04/16 0417)  Labs: Recent Labs    10/04/23 0527 10/05/23 0600 10/05/23 0856 10/06/23 0500  HGB 9.7* 10.8*  --  11.5*  HCT 31.9* 34.8*  --  37.0  PLT 377 431*  --  453*  LABPROT 21.2* 25.7*  --  30.9*  INR 1.8* 2.3*  --  2.9*  HEPARINUNFRC 0.64  --  0.55 0.80*  CREATININE 1.43* 1.47*  --  1.73*    Estimated Creatinine Clearance: 28.3 mL/min (A) (by C-G formula based on SCr of 1.73 mg/dL (H)).   Medical History: Past Medical History:  Diagnosis Date   Acute delirium 08/30/2023   Acute GI bleeding 08/18/2023   Arthritis    Asthma    Breast cancer (HCC) 2001   Left Breast Cancer   CHF (congestive heart failure) (HCC)    COPD (chronic obstructive pulmonary disease) (HCC)    Coronary artery disease    GERD (gastroesophageal reflux disease)    History of mitral valve replacement with mechanical valve    HLD (hyperlipidemia)    Hypertension    Pulmonary embolism (HCC) 2021    Medications:  Scheduled:   budeson-glycopyrrolate-formoterol  2 puff Inhalation BID   Chlorhexidine Gluconate Cloth  6 each Topical Daily   colchicine  0.3 mg Oral Daily   dapagliflozin propanediol  10 mg Oral Daily   gabapentin  200 mg Oral TID   losartan  25 mg Oral Daily   nicotine  7 mg Transdermal Daily   simvastatin  20 mg Oral q1800   sodium chloride flush  10-40 mL Intracatheter Q12H   spironolactone   25 mg Oral Daily   torsemide  80 mg Oral Daily   Vitamin D (Ergocalciferol)  50,000 Units Oral Q7 days   warfarin  5 mg Oral ONCE-1600   Warfarin - Pharmacist Dosing Inpatient   Does not apply q1600    Assessment: Pt with MVR who was readmitted for CHF exacerbation. Pharmacy consulted to dose warfarin. INR 1.4 on admission, bridged with IV heparin. Coumadin continued. Hemoglobin 11.5, platelet count 453. Heparin infusion has been discontinued, having achieved target INR of >/= 2.5 Today, INR = 2.9 Goal of Therapy:  INR 2.5 - 3.5 Monitor platelets by anticoagulation protocol: Yes   Plan:  Warfarin 5 mg PO at 1600 today. Continue daily dosing to adequate response. Monitor for symptoms, signs of bleeding. Heparin has been discontinued.  Kenda Paula, PharmD, CPP Clinical Pharmacist Practitioner  10/06/2023,9:30 AM

## 2023-10-06 NOTE — Progress Notes (Signed)
 Advanced Heart Failure Rounding Note  Cardiologist: Arvilla Meres, MD   Chief Complaint: A/c RV failure   Subjective:    Remains on 0.25 milrinone with CO-OX 55.5% and Fick CI of 2.2 (Hgb 11.5, O2 sat 91%).  CVP 8.   Feeling much better. No dyspnea. Legs are "skinny".  Objective:    Weight Range: 69.3 kg Body mass index is 25.42 kg/m.   Vital Signs:   Temp:  [98 F (36.7 C)-98.2 F (36.8 C)] 98 F (36.7 C) (04/16 0417) Pulse Rate:  [92-100] 92 (04/16 0417) Resp:  [15-23] 20 (04/16 0417) BP: (102-116)/(67-88) 116/67 (04/16 0417) SpO2:  [91 %-98 %] 91 % (04/16 0417) Weight:  [69.3 kg] 69.3 kg (04/16 0417) Last BM Date : 10/04/23  Weight change: Filed Weights   10/04/23 0450 10/05/23 0518 10/06/23 0417  Weight: 72.7 kg 68.9 kg 69.3 kg   Intake/Output:  Intake/Output Summary (Last 24 hours) at 10/06/2023 0657 Last data filed at 10/06/2023 0630 Gross per 24 hour  Intake 779.08 ml  Output 2650 ml  Net -1870.92 ml    Physical Exam    General:  Well appearing elderly female Neck: JVP 7-8 Cor: Regular rate & rhythm. No rubs, gallops or murmurs. Lungs: clear Abdomen: soft, nontender, nondistended.  Extremities: no edema Neuro: alert & orientedx3. Affect pleasant Lines/devices: R internal jugular CVC  Telemetry    SR/ST 90s-100s  EKG    N/A   Labs    CBC Recent Labs    10/05/23 0600 10/06/23 0500  WBC 9.5 9.5  HGB 10.8* 11.5*  HCT 34.8* 37.0  MCV 87.0 87.7  PLT 431* 453*   Basic Metabolic Panel Recent Labs    16/10/96 0527 10/05/23 0600 10/06/23 0500  NA 133* 134* 131*  K 3.5 3.7 4.2  CL 96* 98 98  CO2 27 25 24   GLUCOSE 98 92 115*  BUN 30* 32* 42*  CREATININE 1.43* 1.47* 1.73*  CALCIUM 9.4 10.2 9.7  MG 1.9 2.0  --    BNP: BNP (last 3 results) Recent Labs    08/28/23 0414 09/19/23 2151 09/30/23 0005  BNP 589.0* 900.4* 723.7*   Imaging   No results found.   Medications:    Scheduled Medications:   budeson-glycopyrrolate-formoterol  2 puff Inhalation BID   Chlorhexidine Gluconate Cloth  6 each Topical Daily   colchicine  0.3 mg Oral Daily   dapagliflozin propanediol  10 mg Oral Daily   gabapentin  200 mg Oral TID   losartan  25 mg Oral Daily   nicotine  7 mg Transdermal Daily   simvastatin  20 mg Oral q1800   sodium chloride flush  10-40 mL Intracatheter Q12H   spironolactone  25 mg Oral Daily   torsemide  80 mg Oral Daily   Vitamin D (Ergocalciferol)  50,000 Units Oral Q7 days   Warfarin - Pharmacist Dosing Inpatient   Does not apply q1600   Infusions:  milrinone 0.25 mcg/kg/min (10/05/23 1927)   PRN Medications: acetaminophen **OR** acetaminophen, albuterol, HYDROcodone-acetaminophen, hydrOXYzine, ipratropium-albuterol, senna-docusate, sodium chloride flush  Patient Profile   Kim Anthony is a 73 y.o. female with a history of mechanical MVR in 2001 in [place], [place], CAD, hx breast cancer s/p left mastectomy and radiation/chemo, tobacco use, COPD, prior PE/DVT, HTN, chronic hypoxic respiratory failure (on home O2) and systolic RV failure   Assessment/Plan   1. Acute on chronic diastolic CHF with prominent RV dysfunction: COPD, OHS/OSA, and diastolic CHF from valvular disease likely  play a role in the RV failure.  Last echo with EF 55-60% with D-shaped septum, severe RV dilation, severe RV dysfunction, mechanical MV with mean gradient 3 mmHg, severe TR. She has had multiple recent admissions.  She is markedly volume overloaded on exam, has required milrinone in the past for RV support while diuresing. Suspect end stage RV failure. On Milrinone 0.25 mcg/kg/min. Unable to place PICC, RIJ CVL.  - CO-OX 55% with estimated Fick CI 2.2.  Wean milrinone to 0.125 and recheck CO-OX at 11 am - Continue Torsemide 80 mg daily - Continue Spiro 25 mg daily  - Hold ? blocker with milrinone.  - Continue Losartan 25 mg daily  - Continue Farxiga 10 mg daily - Scr up slightly today, 1.4>>1.7 and  BUN up. Watch closely. Hesitant to cut back diuretics d/t recurrent issues with volume overload.  2. Mechanical mitral valve: Stable on 3/25 echo.  INR 2.9 - Stop heparin gtt - Warfarin for goal INR 2.5-3.5. Discussed dosing with PharmD  3. Gout: Uric acid 16. Pain resolved - Prednisone burst complete. - Continue colchicine    4. CAD: CTO RCA on 7/22 cath.  - Continue statin.   5. Atrial fibrillation/flutter: Paroxysmal. Maintaining NSR.  - Monitor while on milrinone   6. CKD stage 3:  - SCr 2.7 on admission. Creatinine trended down to 1.4. UP slightly to 1.7 today with diuresis and restarting GDMT. - will follow BMP closely    7. Tricuspid regurgitation: Severe in setting of RV failure.   Length of Stay: 6  [name], PA-C  10/06/2023, 6:57 AM  Advanced Heart Failure Team Pager [phone] (M-F; 7a - 5p)  Please contact [facility] for night-coverage after hours (5p -7a ) and weekends on [email or web address]

## 2023-10-06 NOTE — Plan of Care (Signed)
  Problem: Education: Goal: Knowledge of General Education information will improve Description: Including pain rating scale, medication(s)/side effects and non-pharmacologic comfort measures Outcome: Progressing   Problem: Health Behavior/Discharge Planning: Goal: Ability to manage health-related needs will improve Outcome: Progressing   Problem: Clinical Measurements: Goal: Will remain free from infection Outcome: Progressing   Problem: Coping: Goal: Level of anxiety will decrease Outcome: Progressing   Problem: Skin Integrity: Goal: Risk for impaired skin integrity will decrease Outcome: Progressing   Problem: Pain Managment: Goal: General experience of comfort will improve and/or be controlled Outcome: Not Progressing

## 2023-10-06 NOTE — Progress Notes (Signed)
 Mobility Specialist Progress Note:   10/06/23 0915  Mobility  Activity Ambulated with assistance in hallway  Level of Assistance Contact guard assist, steadying assist  Assistive Device Four wheel walker  Distance Ambulated (ft) 150 ft  Activity Response Tolerated well  Mobility Referral Yes  Mobility visit 1 Mobility  Mobility Specialist Start Time (ACUTE ONLY) 0915  Mobility Specialist Stop Time (ACUTE ONLY) 0930  Mobility Specialist Time Calculation (min) (ACUTE ONLY) 15 min   Pt agreeable to mobility session. Required only minG assist during ambulation. No unsteadiness noted with rollator. Pt left sitting EOB with all needs met.   Oneda Big Mobility Specialist Please contact via SecureChat or  Rehab office at [phone]

## 2023-10-07 DIAGNOSIS — I3489 Other nonrheumatic mitral valve disorders: Secondary | ICD-10-CM | POA: Diagnosis not present

## 2023-10-07 DIAGNOSIS — I480 Paroxysmal atrial fibrillation: Secondary | ICD-10-CM | POA: Diagnosis not present

## 2023-10-07 DIAGNOSIS — Z952 Presence of prosthetic heart valve: Secondary | ICD-10-CM | POA: Diagnosis not present

## 2023-10-07 DIAGNOSIS — I50810 Right heart failure, unspecified: Secondary | ICD-10-CM | POA: Diagnosis not present

## 2023-10-07 DIAGNOSIS — I5033 Acute on chronic diastolic (congestive) heart failure: Secondary | ICD-10-CM | POA: Diagnosis not present

## 2023-10-07 DIAGNOSIS — J449 Chronic obstructive pulmonary disease, unspecified: Secondary | ICD-10-CM | POA: Diagnosis not present

## 2023-10-07 DIAGNOSIS — I110 Hypertensive heart disease with heart failure: Secondary | ICD-10-CM | POA: Diagnosis not present

## 2023-10-07 DIAGNOSIS — I2510 Atherosclerotic heart disease of native coronary artery without angina pectoris: Secondary | ICD-10-CM | POA: Diagnosis not present

## 2023-10-07 LAB — CBC
HCT: 38.5 % (ref 36.0–46.0)
Hemoglobin: 11.5 g/dL — ABNORMAL LOW (ref 12.0–15.0)
MCH: 26.3 pg (ref 26.0–34.0)
MCHC: 29.9 g/dL — ABNORMAL LOW (ref 30.0–36.0)
MCV: 87.9 fL (ref 80.0–100.0)
Platelets: 480 10*3/uL — ABNORMAL HIGH (ref 150–400)
RBC: 4.38 MIL/uL (ref 3.87–5.11)
RDW: 19.2 % — ABNORMAL HIGH (ref 11.5–15.5)
WBC: 6.2 10*3/uL (ref 4.0–10.5)
nRBC: 0 % (ref 0.0–0.2)

## 2023-10-07 LAB — BASIC METABOLIC PANEL WITH GFR
Anion gap: 11 (ref 5–15)
Anion gap: 10 (ref 5–15)
BUN: 60 mg/dL — ABNORMAL HIGH (ref 8–23)
BUN: 63 mg/dL — ABNORMAL HIGH (ref 8–23)
CO2: 25 mmol/L (ref 22–32)
CO2: 24 mmol/L (ref 22–32)
Calcium: 9.5 mg/dL (ref 8.9–10.3)
Calcium: 9.2 mg/dL (ref 8.9–10.3)
Chloride: 94 mmol/L — ABNORMAL LOW (ref 98–111)
Chloride: 95 mmol/L — ABNORMAL LOW (ref 98–111)
Creatinine, Ser: 2.33 mg/dL — ABNORMAL HIGH (ref 0.44–1.00)
Creatinine, Ser: 2.13 mg/dL — ABNORMAL HIGH (ref 0.44–1.00)
GFR, Estimated: 22 mL/min — ABNORMAL LOW (ref >60)
GFR, Estimated: 24 mL/min — ABNORMAL LOW (ref >60)
Glucose, Bld: 139 mg/dL — ABNORMAL HIGH (ref 70–99)
Glucose, Bld: 169 mg/dL — ABNORMAL HIGH (ref 70–99)
Potassium: 5.5 mmol/L — ABNORMAL HIGH (ref 3.5–5.1)
Potassium: 4.6 mmol/L (ref 3.5–5.1)
Sodium: 130 mmol/L — ABNORMAL LOW (ref 135–145)
Sodium: 129 mmol/L — ABNORMAL LOW (ref 135–145)

## 2023-10-07 LAB — COOXEMETRY PANEL
Carboxyhemoglobin: 1.9 % — ABNORMAL HIGH (ref 0.5–1.5)
Methemoglobin: <0.7 % (ref 0.0–1.5)
O2 Saturation: 58.9 %
Total hemoglobin: 12 g/dL (ref 12.0–16.0)

## 2023-10-07 LAB — PROTIME-INR
INR: 3 — ABNORMAL HIGH (ref 0.8–1.2)
Prothrombin Time: 31.5 s — ABNORMAL HIGH (ref 11.4–15.2)

## 2023-10-07 MED ORDER — WARFARIN SODIUM 5 MG PO TABS
5.0000 mg | ORAL_TABLET | Freq: Once | ORAL | Status: AC
  Administered 2023-10-07: 5 mg via ORAL
  Filled 2023-10-07: qty 1

## 2023-10-07 NOTE — Progress Notes (Signed)
 Mobility Specialist Progress Note:   10/07/23 1400  Orthostatic Lying   BP- Lying 103/72  Pulse- Lying 95  Orthostatic Sitting  BP- Sitting 113/67  Pulse- Sitting 94  Orthostatic Standing at 0 minutes  BP- Standing at 0 minutes 114/67  Pulse- Standing at 0 minutes 96  Orthostatic Standing at 3 minutes  BP- Standing at 3 minutes 119/77  Pulse- Standing at 3 minutes 98  Mobility  Activity Stood at bedside (check orthostatics)  Level of Assistance Standby assist, set-up cues, supervision of patient - no hands on  Assistive Device Four wheel walker  Distance Ambulated (ft)  (declined)  Activity Response Tolerated well  Mobility Referral Yes  Mobility visit 1 Mobility  Mobility Specialist Start Time (ACUTE ONLY) 1400  Mobility Specialist Stop Time (ACUTE ONLY) 1408  Mobility Specialist Time Calculation (min) (ACUTE ONLY) 8 min   Pt agreeable to mobility session. Required no physical assistance throughout. Negative orthostatics. Back in bed with all needs met.   Kim Anthony Mobility Specialist Please contact via SecureChat or  Rehab office at [phone]

## 2023-10-07 NOTE — Plan of Care (Signed)
   Problem: Coping: Goal: Level of anxiety will decrease Outcome: Progressing

## 2023-10-07 NOTE — Progress Notes (Signed)
 PHARMACY - ANTICOAGULATION CONSULT NOTE  Pharmacy Consult for warfarin management Indication:  Mitral valve INR target range 2.5 - 3.5  Allergies  Allergen Reactions   Lisinopril Swelling   Acetaminophen-Codeine Itching   Propoxyphene Itching    Darvocet   Tape Rash    Plastic   Tramadol Itching, Nausea And Vomiting and Rash    Patient Measurements: Height: 5\' 5"  (165.1 cm) Weight: 71.8 kg (158 lb 4.6 oz) IBW/kg (Calculated) : 57 HEPARIN DW (KG): 73.1  Vital Signs: Temp: 98 F (36.7 C) (04/17 0737) Temp Source: Oral (04/17 0737) BP: 114/78 (04/17 0737) Pulse Rate: 102 (04/17 0737)  Labs: Recent Labs    10/05/23 0600 10/05/23 0856 10/06/23 0500 10/07/23 0325  HGB 10.8*  --  11.5* 11.5*  HCT 34.8*  --  37.0 38.5  PLT 431*  --  453* 480*  LABPROT 25.7*  --  30.9* 31.5*  INR 2.3*  --  2.9* 3.0*  HEPARINUNFRC  --  0.55 0.80*  --   CREATININE 1.47*  --  1.73* 2.33*    Estimated Creatinine Clearance: 21.4 mL/min (A) (by C-G formula based on SCr of 2.33 mg/dL (H)).   Medical History: Past Medical History:  Diagnosis Date   Acute delirium 08/30/2023   Acute GI bleeding 08/18/2023   Arthritis    Asthma    Breast cancer (HCC) 2001   Left Breast Cancer   CHF (congestive heart failure) (HCC)    COPD (chronic obstructive pulmonary disease) (HCC)    Coronary artery disease    GERD (gastroesophageal reflux disease)    History of mitral valve replacement with mechanical valve    HLD (hyperlipidemia)    Hypertension    Pulmonary embolism (HCC) 2021     Assessment: Pt with MVR who was readmitted for CHF exacerbation. Pharmacy consulted to dose warfarin. INR 1.4 on admission, bridged with IV heparin. Coumadin continued. Hemoglobin 11.5, platelet count 480. No issues with bleeding reported. Heparin infusion has been discontinued, having achieved target INR of >/= 2.5 Today, INR 32.9 > 3.0 stabilizing.   Goal of Therapy:  INR 2.5 - 3.5 Monitor platelets by  anticoagulation protocol: Yes   Plan:  Give warfarin 5 mg PO x1 dose Check INR daily while on warfarin Continue to monitor H&H and platelets   Thank you for allowing pharmacy to be a part of this patient's care.  Claudia Cuff, PharmD, BCPS Clinical Pharmacist

## 2023-10-07 NOTE — Progress Notes (Signed)
 Assumed care of patient. Resting in bed with family at bedside. No needs or concerns expressed at this time. Bed low and locked, call light and belongings in reach.

## 2023-10-07 NOTE — Care Management Important Message (Signed)
 Important Message  Patient Details  Name: Kim Anthony [name]: [id number] Date of Birth: Aug 22, 1950   Important Message Given:  Yes - Medicare IM     [name] 10/07/2023, 10:35 AM

## 2023-10-07 NOTE — Plan of Care (Signed)
  Problem: Education: Goal: Knowledge of General Education information will improve Description: Including pain rating scale, medication(s)/side effects and non-pharmacologic comfort measures Outcome: Progressing   Problem: Health Behavior/Discharge Planning: Goal: Ability to manage health-related needs will improve Outcome: Progressing   Problem: Clinical Measurements: Goal: Ability to maintain clinical measurements within normal limits will improve Outcome: Progressing Goal: Respiratory complications will improve Outcome: Progressing   Problem: Activity: Goal: Risk for activity intolerance will decrease Outcome: Progressing   Problem: Nutrition: Goal: Adequate nutrition will be maintained Outcome: Progressing   Problem: Coping: Goal: Level of anxiety will decrease Outcome: Progressing   Problem: Pain Managment: Goal: General experience of comfort will improve and/or be controlled Outcome: Progressing

## 2023-10-07 NOTE — Progress Notes (Signed)
 Occupational Therapy Treatment Patient Details Name: Kim Anthony [name]: [id number] DOB: Dec 17, 1950 Today's Date: 10/07/2023   History of present illness Pt is a 73 y/o F presenting to ED on 4/9 with weight gain and SOB, LE edema. Admitted for CHF exacerbation. Doppler negative for DVT. PMH includes A fib, MVR of warfarin, COPD, CKD IIIB, CHF with severely reduced RV EF, prediabetes, breast CA   OT comments  Pt completed ADLs sit<>stand with up to min assist. Pt did not require rest breaks. Cues to sit for donning and doffing pants/panties to decrease risk of falls.       If plan is discharge home, recommend the following:  A little help with walking and/or transfers;A lot of help with bathing/dressing/bathroom;Assistance with cooking/housework;Direct supervision/assist for medications management;Direct supervision/assist for financial management;Assist for transportation;Help with stairs or ramp for entrance;Supervision due to cognitive status   Equipment Recommendations  None recommended by OT    Recommendations for Other Services      Precautions / Restrictions Precautions Precautions: Fall Recall of Precautions/Restrictions: Intact Restrictions Weight Bearing Restrictions Per Provider Order: No       Mobility Bed Mobility                    Transfers Overall transfer level: Needs assistance Equipment used: Rollator (4 wheels) Transfers: Sit to/from Stand Sit to Stand: Supervision           General transfer comment: supervision for lines, no physical assist     Balance   Sitting-balance support: Feet supported, No upper extremity supported Sitting balance-Leahy Scale: Good     Standing balance support: During functional activity Standing balance-Leahy Scale: Fair Standing balance comment: stabilizes one hand on rollator with pericare, can release to manage pants                           ADL either performed or assessed with clinical  judgement   ADL Overall ADL's : Needs assistance/impaired     Grooming: Wash/dry hands;Wash/dry face;Sitting;Set up   Upper Body Bathing: Set up;Sitting   Lower Body Bathing: Minimal assistance;Sit to/from stand   Upper Body Dressing : Set up;Sitting   Lower Body Dressing: Supervision/safety;Sit to/from stand       Toileting- Architect and Hygiene: Supervision/safety;Sit to/from stand              Extremity/Trunk Assessment              Vision       Perception     Praxis     Communication Communication Communication: No apparent difficulties   Cognition Arousal: Alert Behavior During Therapy: WFL for tasks assessed/performed Cognition: No family/caregiver present to determine baseline       Memory impairment (select all impairments): Short-term memory   Executive functioning impairment (select all impairments): Reasoning, Problem solving                   Following commands: Intact        Cueing      Exercises      Shoulder Instructions       General Comments      Pertinent Vitals/ Pain       Pain Assessment Pain Assessment: No/denies pain  Home Living  Prior Functioning/Environment              Frequency  Min 2X/week        Progress Toward Goals  OT Goals(current goals can now be found in the care plan section)  Progress towards OT goals: Progressing toward goals  Acute Rehab OT Goals OT Goal Formulation: With patient Time For Goal Achievement: 10/14/23 Potential to Achieve Goals: Good  Plan      Co-evaluation                 AM-PAC OT "6 Clicks" Daily Activity     Outcome Measure   Help from another person eating meals?: None Help from another person taking care of personal grooming?: A Little Help from another person toileting, which includes using toliet, bedpan, or urinal?: A Little Help from another person bathing  (including washing, rinsing, drying)?: A Little Help from another person to put on and taking off regular upper body clothing?: A Little Help from another person to put on and taking off regular lower body clothing?: A Little 6 Click Score: 19    End of Session Equipment Utilized During Treatment: Rolling walker (2 wheels)  OT Visit Diagnosis: Unsteadiness on feet (R26.81);Muscle weakness (generalized) (M62.81)   Activity Tolerance Patient tolerated treatment well   Patient Left in bed;with call bell/phone within reach (EOB)   Nurse Communication          Time: 8657-8469 OT Time Calculation (min): 39 min  Charges: OT General Charges $OT Visit: 1 Visit OT Treatments $Self Care/Home Management : 38-52 mins  Avanell Leigh, OTR/L Acute Rehabilitation Services Office: [phone]   Jonette Nestle 10/07/2023, 10:31 AM

## 2023-10-07 NOTE — Progress Notes (Signed)
 Advanced Heart Failure Rounding Note  Cardiologist: Arvilla Meres, MD   Chief Complaint: A/c RV failure   Subjective:    Feeling well this morning off milrinone. Notes recurrent gout pain involving right hand.  CO-OX 59%. CVP 7.  Scr up further to 2.3.   Objective:    Weight Range: 71.8 kg Body mass index is 26.34 kg/m.   Vital Signs:   Temp:  [97.8 F (36.6 C)-98.4 F (36.9 C)] 98.4 F (36.9 C) (04/17 0515) Pulse Rate:  [93-100] 100 (04/17 0515) Resp:  [14-17] 16 (04/17 0515) BP: (97-122)/(52-78) 100/62 (04/17 0515) SpO2:  [92 %-97 %] 92 % (04/17 0515) Weight:  [71.8 kg] 71.8 kg (04/17 0515) Last BM Date : 10/04/23  Weight change: Filed Weights   10/05/23 0518 10/06/23 0417 10/07/23 0515  Weight: 68.9 kg 69.3 kg 71.8 kg   Intake/Output:  Intake/Output Summary (Last 24 hours) at 10/07/2023 0740 Last data filed at 10/07/2023 0700 Gross per 24 hour  Intake 770 ml  Output --  Net 770 ml    Physical Exam    General: Chronically ill appearing elderly female Neck: JVP 7-8  Cor: Regular rate & rhythm. No rubs, gallops or murmurs. Lungs: clear Abdomen: soft, nontender, nondistended.  Extremities: no edema Neuro: alert & orientedx3. Affect pleasant   Telemetry    SR 80s-90s  EKG    N/A   Labs    CBC Recent Labs    10/06/23 0500 10/07/23 0325  WBC 9.5 6.2  HGB 11.5* 11.5*  HCT 37.0 38.5  MCV 87.7 87.9  PLT 453* 480*   Basic Metabolic Panel Recent Labs    16/10/96 0600 10/06/23 0500 10/07/23 0325  NA 134* 131* 130*  K 3.7 4.2 5.5*  CL 98 98 94*  CO2 25 24 25   GLUCOSE 92 115* 139*  BUN 32* 42* 60*  CREATININE 1.47* 1.73* 2.33*  CALCIUM 10.2 9.7 9.5  MG 2.0 2.1  --    BNP: BNP (last 3 results) Recent Labs    08/28/23 0414 09/19/23 2151 09/30/23 0005  BNP 589.0* 900.4* 723.7*   Imaging   No results found.   Medications:    Scheduled Medications:  budeson-glycopyrrolate-formoterol  2 puff Inhalation BID    Chlorhexidine Gluconate Cloth  6 each Topical Daily   colchicine  0.3 mg Oral Daily   dapagliflozin propanediol  10 mg Oral Daily   gabapentin  200 mg Oral TID   nicotine  7 mg Transdermal Daily   predniSONE  20 mg Oral Q breakfast   simvastatin  20 mg Oral q1800   sodium chloride flush  10-40 mL Intracatheter Q12H   Vitamin D (Ergocalciferol)  50,000 Units Oral Q7 days   Warfarin - Pharmacist Dosing Inpatient   Does not apply q1600   Infusions:   PRN Medications: acetaminophen **OR** acetaminophen, albuterol, HYDROcodone-acetaminophen, hydrOXYzine, ipratropium-albuterol, senna-docusate, sodium chloride flush  Patient Profile   Kim Anthony is a 74 y.o. female with a history of mechanical MVR in 2001 in [place], [place], CAD, hx breast cancer s/p left mastectomy and radiation/chemo, tobacco use, COPD, prior PE/DVT, HTN, chronic hypoxic respiratory failure (on home O2) and systolic RV failure   Assessment/Plan   1. Acute on chronic diastolic CHF with prominent RV dysfunction: COPD, OHS/OSA, and diastolic CHF from valvular disease likely play a role in the RV failure.  Last echo with EF 55-60% with D-shaped septum, severe RV dilation, severe RV dysfunction, mechanical MV with mean gradient 3 mmHg, severe  TR. She has had multiple recent admissions.  She is markedly volume overloaded on exam, has required milrinone in the past for RV support while diuresing. Suspect end stage RV failure.  - CO-OX 59% off milrinone.  - Scr has bumped to 2.3 but suspect 2/2 overdiuresis. - Hold Torsemide, spiro and losartan - Continue Farxiga - Hold beta blocker with recent inotrope support - Recheck labs this afternoon. If Scr starting to improve may be able to discontinue R internal jugular central line  2. Mechanical mitral valve: Stable on 3/25 echo.  INR 3.0 - Warfarin for goal INR 2.5-3.5. Discussed dosing with PharmD  3. Gout: Uric acid 16.  - Already completed several days of prednisone. IM restarted  20 mg prednisone yesterday for recurrent pain. Planning outpatient taper. Would use caution with longer duration of prednisone given recurrent volume overload. Consider another burst w/ 40 mg prednisone alternatively. - Continue colchicine    4. CAD: CTO RCA on 7/22 cath.  - Continue statin.   5. Atrial fibrillation/flutter: Paroxysmal. Maintaining NSR.  - Monitor while on milrinone   6. CKD stage 3:  - SCr 2.7 on admission. Creatinine trended down to 1.4. Up to 2.3 today in setting of overdiuresis and restarting GDMT. - will follow BMP closely    7. Tricuspid regurgitation: Severe in setting of RV failure.   Length of Stay: 7  Kim Anthony N, PA-C  10/07/2023, 7:40 AM  Advanced Heart Failure Team Pager [phone] (M-F; 7a - 5p)  Please contact [facility] for night-coverage after hours (5p -7a ) and weekends on [email or web address]

## 2023-10-07 NOTE — Progress Notes (Addendum)
 Interval History  HF d/c milrinone last night. No changes to torsemide 80mg  Restarted prednisone 20mg  for gout.  She still feels "great," the same as yesterday and was able to make a normal amount of urine this morning, she does not feel any more SOB while walking than before. In the room she was saturating well in the 90s on RA. She continues to feel pain in her R hand, but notes it is better than yesterday after restarting prednisone last night. She denies any vision changes. She wants to go home but is understanding that that might not be possible today.  Physical exam Blood pressure 110/78, pulse 95, temperature 98 F (36.7 C), temperature source Oral, resp. rate 16, height 5\' 5"  (1.651 m), weight 71.8 kg, SpO2 94%.  General: She is well appearing, able to move around without difficulty, looks a bit dry  Pulm: Normal work of breathing, lungs are clear to auscultation in all lung fields, no crackles or wheezes Cardio: Normal rate and rhythm, no JVD, negative hepatojugular reflex, no LEE, her feet are warm bilaterally with strong dorsalis pedis pulses Abdominal: nondistended Extremities: Her R fingers appear more swollen than yesterday, they continue to be stiff, they are tender to palpation at R DIPs. Her knees are at baseline.   Weight change: 2.5 kg   Intake/Output Summary (Last 24 hours) at 10/07/2023 1200 Last data filed at 10/07/2023 0900 Gross per 24 hour  Intake 891.51 ml  Output --  Net 891.51 ml   Net IO Since Admission: -11,713.35 mL [10/07/23 1200]  Output of I/Os were not accurately measured, pt reports making urine this morning.  Labs, images, and other studies    Latest Ref Rng & Units 10/07/2023    3:25 AM 10/06/2023    5:00 AM 10/05/2023    6:00 AM  CBC  WBC 4.0 - 10.5 K/uL 6.2  9.5  9.5   Hemoglobin 12.0 - 15.0 g/dL 44.0  10.2  72.5   Hematocrit 36.0 - 46.0 % 38.5  37.0  34.8   Platelets 150 - 400 K/uL 480  453  431        Latest Ref Rng &  Units 10/07/2023    3:25 AM 10/06/2023    5:00 AM 10/05/2023    6:00 AM  CMP  Glucose 70 - 99 mg/dL 366  440  92   BUN 8 - 23 mg/dL 60  42  32   Creatinine 0.44 - 1.00 mg/dL 3.47  4.25  9.56   Sodium 135 - 145 mmol/L 130  131  134   Potassium 3.5 - 5.1 mmol/L 5.5  4.2  3.7   Chloride 98 - 111 mmol/L 94  98  98   CO2 22 - 32 mmol/L 25  24  25    Calcium 8.9 - 10.3 mg/dL 9.5  9.7  38.7   Total Protein 6.5 - 8.1 g/dL  7.4  7.6   Total Bilirubin 0.0 - 1.2 mg/dL  0.7  0.9   Alkaline Phos 38 - 126 U/L  129  123   AST 15 - 41 U/L  36  39   ALT 0 - 44 U/L  24  21     Coox 59.2-->58.9   Assessment and plan Hospital day 7  Kim Anthony is a 73 y.o. person living with a history of A-fib, mitral valve replacement on warfarin, COPD, CKD 3B, congestive heart  failure with severely reduced right ventricular ejection fraction, and history of breast cancer s/p L mastectomy who presented with dyspnea and volume overload and was admitted for acute on chronic right heart failure complicated by acute on chronic kidney injury 2/2 to diuresis.    Heart failure with severely reduced right ventricular ejection fraction Low output heart failure She has no SOB today and feels good after stopping milrinone. She looks a little dry on exam today, her standing weight was up 6lbs from 152lbs-->158lbs, unsure what to attribute this increase to. Her lungs remain clear, no JVD, no LEE and she continues to saturate well on RA. We do not have an accurate measurement of I/Os but the patient reports that she is making urine. Concerns of over diuresis remain. Torsemide is being held per HF, appreciate further recommendations.   - Heart failure recommendations - Orthostatics  - Holding Torsemide 80mg   - Co-ox  - Strict I/O's - Standing weights - CVP - BMP - Dapagliflozin 10mg   Acute on chronic Kidney injury  CKD 3 Cr has increased 1.73-->2.33 and her K+ is 4.2-->5.5. Her bicarbonate is wnl, no acute concerns for  acidosis. At this time likely due to too much diuresis. Appreciate heart failure recommendations. - Holding losartan 25 mg daily -  F/U BMP  B/l Eye pressure, resolved Headaches, resolved Elevated TSH/T4 No current vision changes. Her ACTH came back wnl, since this is not elevated there is even less concern for a pituitary adenoma with normal ILG-F, mildly elevated prolactin. Currently the plan is to check TSH/T4 outpatient for further monitoring in case her headaches were 2/2 mass effect.  - Repeat TSH/T4 outpatient, consider MRI based on results   Polyarticular Gout 2/2 CKD 3 Her R hand digits look more swollen today with TTP. The stiffness in her hands is mildly better today after restarting prednisone 20mg , it will be difficult to control her gout flares in the setting of dehydration with her history of CKD and very high uric acid. Will taper prednisone for now, as a 5 day course was not efficient. Will do renally dosed Allopurinol 50 mg, upon discharge for prophylaxis considering her gout flares continue to reoccur. - Prednisone 20mg  day 1  - Continue Colchicine for 3-6 months outpatient together with allopurinol, then allopurinol alone   Mechanical mitral valve A-fib Hx of PE and DVT  Acute cephalic vein thrombosis, left arm, hx of L breast cancer s/p L mastectomy  INR stable in therapeutic range for her chronic conditions. Continue Warfarin per pharmacy.  Osteoporosis  Vit D 2024 5.2 and had a R hip fracture 2024.  - High dose vit D 50k oral weekly for 8 weeks, then recheck. Start on Bisphophonate at the end of 8 weeks.   Normocytic Anemia Chronic. Her Hg is stable. She may benefit from iron supplementation outpatient though this can cause constipation. - CBC trending  Facial edema Her periorbital edema has resolved and her facial swelling has improved likely because she is now euvolemic.   Chronic problems: Hyperlipidemia- Continue simvastatin 20 mg daily. Hypertension-  holding losartan and spironolactone. Continue dapagliflozin per heart failure.  COPD- Currently at baseline on room air. Continue Breztri and NRT. Chronic neck, lower back pain- On Norco and Gabapentin 200 mg 3 times daily.   Tobacco use disorder: NRT  Code: Full PT/OT rec: Home Health    This is a Psychologist, occupational Note.  The care of the patient was discussed with Dr. Hassan Rowan and Dr. Geraldo Pitter and the assessment  and plan was formulated with their assistance.  Please see their note for official documentation of the patient encounter.   Signed: Jere Monaco, Medical Student  Attestation for Student Documentation:  I personally was present and re-performed the history, physical exam and medical decision-making activities of this service and have verified that the service and findings are accurately documented in the student's note.  Cleven [place], DO 10/07/2023, 4:38 PM

## 2023-10-07 NOTE — Progress Notes (Signed)
 Nutrition Education Note  RD consulted for nutrition education regarding new onset CHF.  RD provided "Low Sodium Nutrition Therapy" handout from the Academy of Nutrition and Dietetics. Reviewed patient's dietary recall. Provided examples on ways to decrease sodium intake in diet. Discouraged intake of processed foods and use of salt shaker. Encouraged fresh fruits and vegetables as well as whole grain sources of carbohydrates to maximize fiber intake.   RD discussed why it is important for patient to adhere to diet recommendations, and emphasized the role of fluids, foods to avoid, and importance of weighing self daily. Teach back method used.  Pt reported daughter does most of grocery shopping and looks at food labels. Discussed foods to eat in moderation and healthy substitutions for processed/high sodium foods.   Body mass index is 26.34 kg/m.   Current diet order is Heart healthy, patient is consuming approximately 100% of meals at this time. Labs and medications reviewed. No further nutrition interventions warranted at this time. RD contact information provided. If additional nutrition issues arise, please re-consult RD.   Kim Anthony, MPH, RD, LDN Clinical Dietitian Contact information can be found at [facility].

## 2023-10-08 ENCOUNTER — Other Ambulatory Visit (HOSPITAL_COMMUNITY): Payer: Self-pay

## 2023-10-08 DIAGNOSIS — I2510 Atherosclerotic heart disease of native coronary artery without angina pectoris: Secondary | ICD-10-CM | POA: Diagnosis not present

## 2023-10-08 DIAGNOSIS — I5033 Acute on chronic diastolic (congestive) heart failure: Secondary | ICD-10-CM | POA: Diagnosis not present

## 2023-10-08 DIAGNOSIS — I3489 Other nonrheumatic mitral valve disorders: Secondary | ICD-10-CM | POA: Diagnosis not present

## 2023-10-08 DIAGNOSIS — I480 Paroxysmal atrial fibrillation: Secondary | ICD-10-CM | POA: Diagnosis not present

## 2023-10-08 LAB — CBC
HCT: 35.1 % — ABNORMAL LOW (ref 36.0–46.0)
Hemoglobin: 10.6 g/dL — ABNORMAL LOW (ref 12.0–15.0)
MCH: 26.6 pg (ref 26.0–34.0)
MCHC: 30.2 g/dL (ref 30.0–36.0)
MCV: 88.2 fL (ref 80.0–100.0)
Platelets: 484 10*3/uL — ABNORMAL HIGH (ref 150–400)
RBC: 3.98 MIL/uL (ref 3.87–5.11)
RDW: 19 % — ABNORMAL HIGH (ref 11.5–15.5)
WBC: 9.7 10*3/uL (ref 4.0–10.5)
nRBC: 0 % (ref 0.0–0.2)

## 2023-10-08 LAB — BASIC METABOLIC PANEL WITH GFR
Anion gap: 10 (ref 5–15)
BUN: 67 mg/dL — ABNORMAL HIGH (ref 8–23)
CO2: 25 mmol/L (ref 22–32)
Calcium: 9.4 mg/dL (ref 8.9–10.3)
Chloride: 97 mmol/L — ABNORMAL LOW (ref 98–111)
Creatinine, Ser: 1.92 mg/dL — ABNORMAL HIGH (ref 0.44–1.00)
GFR, Estimated: 27 mL/min — ABNORMAL LOW (ref >60)
Glucose, Bld: 104 mg/dL — ABNORMAL HIGH (ref 70–99)
Potassium: 4.2 mmol/L (ref 3.5–5.1)
Sodium: 132 mmol/L — ABNORMAL LOW (ref 135–145)

## 2023-10-08 LAB — PROTIME-INR
INR: 3.1 — ABNORMAL HIGH (ref 0.8–1.2)
Prothrombin Time: 31.8 s — ABNORMAL HIGH (ref 11.4–15.2)

## 2023-10-08 LAB — COOXEMETRY PANEL
Carboxyhemoglobin: 2.2 % — ABNORMAL HIGH (ref 0.5–1.5)
Methemoglobin: 0.7 % (ref 0.0–1.5)
O2 Saturation: 70.5 %
Total hemoglobin: 11 g/dL — ABNORMAL LOW (ref 12.0–16.0)

## 2023-10-08 MED ORDER — COLCHICINE 0.6 MG PO TABS
0.3000 mg | ORAL_TABLET | Freq: Every day | ORAL | 1 refills | Status: DC
  Filled 2023-10-08: qty 30, 60d supply, fill #0

## 2023-10-08 MED ORDER — WARFARIN SODIUM 5 MG PO TABS
5.0000 mg | ORAL_TABLET | Freq: Every day | ORAL | 0 refills | Status: DC
  Filled 2023-10-08: qty 30, 30d supply, fill #0

## 2023-10-08 MED ORDER — WARFARIN SODIUM 5 MG PO TABS: 5.0000 mg | ORAL_TABLET | Freq: Every day | ORAL | Status: DC

## 2023-10-08 MED ORDER — GABAPENTIN 100 MG PO CAPS
200.0000 mg | ORAL_CAPSULE | Freq: Three times a day (TID) | ORAL | 0 refills | Status: DC
  Filled 2023-10-08: qty 30, 5d supply, fill #0

## 2023-10-08 MED ORDER — PREDNISONE 10 MG PO TABS
ORAL_TABLET | ORAL | 0 refills | Status: AC
  Filled 2023-10-08: qty 17, 8d supply, fill #0

## 2023-10-08 MED ORDER — PREDNISONE 20 MG PO TABS: 40.0000 mg | ORAL_TABLET | Freq: Every day | ORAL | Status: DC

## 2023-10-08 MED ORDER — PREDNISONE 20 MG PO TABS
ORAL_TABLET | ORAL | 0 refills | Status: DC
  Filled 2023-10-08: qty 7, 5d supply, fill #0

## 2023-10-08 MED ORDER — TORSEMIDE 20 MG PO TABS
80.0000 mg | ORAL_TABLET | Freq: Every day | ORAL | 0 refills | Status: DC
Start: 2023-10-08 — End: 2023-10-26
  Filled 2023-10-08: qty 120, 30d supply, fill #0

## 2023-10-08 MED ORDER — PREDNISONE 20 MG PO TABS
20.0000 mg | ORAL_TABLET | Freq: Once | ORAL | Status: AC
  Administered 2023-10-08: 20 mg via ORAL
  Filled 2023-10-08: qty 1

## 2023-10-08 MED ORDER — SENNOSIDES-DOCUSATE SODIUM 8.6-50 MG PO TABS
1.0000 | ORAL_TABLET | Freq: Every evening | ORAL | 0 refills | Status: DC | PRN
  Filled 2023-10-08: qty 30, 30d supply, fill #0

## 2023-10-08 MED ORDER — VITAMIN D (ERGOCALCIFEROL) 1.25 MG (50000 UNIT) PO CAPS
50000.0000 [IU] | ORAL_CAPSULE | ORAL | 0 refills | Status: DC
  Filled 2023-10-08: qty 5, 35d supply, fill #0

## 2023-10-08 NOTE — TOC Transition Note (Signed)
 Transition of Care [facility]) - Discharge Note   Patient Details  Name: Kim Anthony [name]: [id number] Date of Birth: Jun 19, 1951  [facility] CM/SW Contact:  Benjiman Bras, RN Phone Number: [phone] 10/08/2023, 2:22 PM   Clinical Narrative:     TOC CM contacted [facility] rep, Thurston Flow to make aware of dc home today with HH.   Final next level of care: Home w Home Health Services Barriers to Discharge: No Barriers Identified   Patient Goals and CMS Choice Patient states their goals for this hospitalization and ongoing recovery are:: retirn home CMS Medicare.gov Compare Post Acute Care list provided to:: Patient Choice offered to / list presented to : Patient      Discharge Placement                       Discharge Plan and Services Additional resources added to the After Visit Summary for                            [facility] Arranged: PT, OT [facility] Agency: [facility] Date [facility] Agency Contacted: 10/08/23 Time HH Agency Contacted: 1421 Representative spoke with at [facility] Agency: Thurston Flow  Social Drivers of Health (SDOH) Interventions SDOH Screenings   Food Insecurity: No Food Insecurity (09/30/2023)  Housing: Low Risk  (09/30/2023)  Transportation Needs: No Transportation Needs (09/30/2023)  Utilities: Not At Risk (09/30/2023)  Alcohol Screen: Low Risk  (12/01/2022)  Depression (PHQ2-9): Low Risk  (12/01/2022)  Financial Resource Strain: Low Risk  (12/01/2022)  Physical Activity: Inactive (12/01/2022)  Social Connections: Unknown (09/30/2023)  Stress: Stress Concern Present (12/01/2022)  Tobacco Use: High Risk (09/29/2023)     Readmission Risk Interventions    09/22/2023    2:20 PM 02/19/2022    3:36 PM  Readmission Risk Prevention Plan  Transportation Screening Complete Complete  PCP or Specialist Appt within 3-5 Days  Complete  HRI or Home Care Consult  Complete  Social Work Consult for Recovery Care Planning/Counseling  Complete  Palliative Care  Screening  Not Applicable  Medication Review [employer]) Complete Complete  HRI or Home Care Consult Complete   Palliative Care Screening Not Applicable   Skilled Nursing Facility Not Applicable

## 2023-10-08 NOTE — Plan of Care (Signed)

## 2023-10-08 NOTE — Progress Notes (Signed)
 DISCHARGE NOTE HOME Kim Anthony to be discharged HomeDISCHARGE NOTE HOME Kim Anthony to be discharged Home per MD order. Discussed prescriptions and follow up appointments with the patient. Prescriptions given to patient; medication list explained in detail. Patient verbalized understanding.  Skin clean, dry and intact without evidence of skin break down, no evidence of skin tears noted. IV catheter discontinued intact. Site without signs and symptoms of complications. Dressing and pressure applied. Pt denies pain at the site currently. No complaints noted.  Patient free of lines, drains, and wounds.   An After Visit Summary (AVS) was printed and given to the patient. Patient escorted via wheelchair, and discharged home via private auto.  Tonda Francisco, RN per MD order. Discussed prescriptions and follow up appointments with the patient. Prescriptions given to patient; medication list explained in detail. Patient verbalized understanding.  Skin clean, dry and intact without evidence of skin break down, no evidence of skin tears noted. IV catheter discontinued intact. Site without signs and symptoms of complications. Dressing and pressure applied. Pt denies pain at the site currently. No complaints noted.  Patient free of lines, drains, and wounds.   An After Visit Summary (AVS) was printed and given to the patient. Patient escorted via wheelchair, and discharged home via private auto.  Tonda Francisco, RN

## 2023-10-08 NOTE — Discharge Instructions (Addendum)
 [name],  Thank you for allowing us  to take care of you while you are in the hospital.  You were admitted because you presented with weight gain, shortness of breath, malaise and found to have a heart failure exacerbation.  You received diuretics intravenously and also required support with milrinone .  You were also given spironolactone .  You had about 10 pounds of fluid retention when you came in.  When you get home: Weigh yourself today using your scale.  Write this weight down. Weigh yourself daily, if you increase 3 pounds in 24 hours or 5 pounds in 1 week please give the IM clinic a call. Use 80 mg of torsemide  daily Use 10 mg of Farxiga  daily We are holding off of your losartan , your spironolactone , as well as your metoproplol.  You do have an appointment with us  in clinic on 23 April with Dr. Arrellano at 1:45 PM.  Please discussed with Dr. Marti Slates restarting these medications during your appointment.  You will also need to follow with heart failure.  You have an appointment with the heart failure clinic on May 2.  For your gout: We are discharging you on prednisone .  You will need to take: 40 mg of prednisone  daily for 2 more days. Then take 20 mg daily for 3 more days. Then take 10 mg prednisone  daily for 3 more days.  Then stop. Continue taking your colchicine  daily, follow-up with the [place]  clinic with regards to starting your allopurinol .  You have a history of a right hip fracture in 2024 and should be on vitamin D  as well as bisphosphonate.  Started you on high-dose vitamin D  which you should take weekly for 8 weeks.  Please discuss with the [facility] clinic starting bisphosphonate at the end of this week.  For your atrial fibrillation: Your INR is now at therapeutic range.  Make sure to follow-up on your scheduled blood draws and visits to continue having a therapeutic range of your INR.  During your stay you also complained of headaches around your eyes, we did check your thyroid   and this was elevated.  He will need to follow-up with us  in clinic about this.  Should you have any questions please do not hesitate to call the IM clinic at [phone]  Sincerely, Your primary care team

## 2023-10-08 NOTE — Progress Notes (Addendum)
 Advanced Heart Failure Rounding Note  Cardiologist: Toribio Fuel, MD   Chief Complaint: A/C RV Failure   Subjective:     Denies SOB. Wants  to go home. Denies pain.   Objective:    Weight Range: 71.8 kg Body mass index is 26.34 kg/m.   Vital Signs:   Temp:  [97.7 F (36.5 C)-98.7 F (37.1 C)] 97.7 F (36.5 C) (04/18 0720) Pulse Rate:  [93-100] 95 (04/18 0720) Resp:  [16-23] 20 (04/18 0545) BP: (110-130)/(67-80) 118/72 (04/18 0545) SpO2:  [93 %-98 %] 97 % (04/18 0545) Weight:  [71.8 kg] 71.8 kg (04/18 0545) Last BM Date : 10/07/23  Weight change: Filed Weights   10/06/23 0417 10/07/23 0515 10/08/23 0545  Weight: 69.3 kg 71.8 kg 71.8 kg   Intake/Output:  Intake/Output Summary (Last 24 hours) at 10/08/2023 0803 Last data filed at 10/07/2023 1300 Gross per 24 hour  Intake 713 ml  Output --  Net 713 ml   CVP 8-9  Physical Exam    General:   No resp difficulty HEENT: normal Neck: supple. JVP 7-8 . Carotids 2+ bilat; no bruits. No lymphadenopathy or thryomegaly appreciated. Cor: PMI nondisplaced. Regular rate & rhythm. No rubs, gallops or murmurs. Lungs: clear Abdomen: soft, nontender, nondistended. No hepatosplenomegaly. No bruits or masses. Good bowel sounds. Extremities: no cyanosis, clubbing, rash, edema Neuro: alert & orientedx3, cranial nerves grossly intact. moves all 4 extremities w/o difficulty. Affect pleasant   Telemetry    SR 80-90s   EKG    N/A   Labs    CBC Recent Labs    10/07/23 0325 10/08/23 0500  WBC 6.2 9.7  HGB 11.5* 10.6*  HCT 38.5 35.1*  MCV 87.9 88.2  PLT 480* 484*   Basic Metabolic Panel Recent Labs    95/83/74 0500 10/07/23 0325 10/07/23 1330 10/08/23 0500  NA 131*   < > 129* 132*  K 4.2   < > 4.6 4.2  CL 98   < > 95* 97*  CO2 24   < > 24 25  GLUCOSE 115*   < > 169* 104*  BUN 42*   < > 63* 67*  CREATININE 1.73*   < > 2.13* 1.92*  CALCIUM  9.7   < > 9.2 9.4  MG 2.1  --   --   --    < > = values in this  interval not displayed.   BNP: BNP (last 3 results) Recent Labs    08/28/23 0414 09/19/23 2151 09/30/23 0005  BNP 589.0* 900.4* 723.7*   Imaging   No results found.   Medications:    Scheduled Medications:  budeson-glycopyrrolate -formoterol   2 puff Inhalation BID   Chlorhexidine  Gluconate Cloth  6 each Topical Daily   colchicine   0.3 mg Oral Daily   dapagliflozin  propanediol  10 mg Oral Daily   gabapentin   200 mg Oral TID   nicotine   7 mg Transdermal Daily   predniSONE   20 mg Oral Once   [START ON 10/09/2023] predniSONE   40 mg Oral Q breakfast   simvastatin   20 mg Oral q1800   sodium chloride  flush  10-40 mL Intracatheter Q12H   Vitamin D  (Ergocalciferol )  50,000 Units Oral Q7 days   Warfarin - Pharmacist Dosing Inpatient   Does not apply q1600   Infusions:   PRN Medications: acetaminophen  **OR** acetaminophen , albuterol , HYDROcodone -acetaminophen , hydrOXYzine , ipratropium-albuterol , senna-docusate, sodium chloride  flush  Patient Profile   Kim Anthony is a 73 y.o. female with a history of mechanical MVR  in 2001 in [place], [place], CAD, hx breast cancer s/p left mastectomy and radiation/chemo, tobacco use, COPD, prior PE/DVT, HTN, chronic hypoxic respiratory failure (on home O2) and systolic RV failure   Assessment/Plan   1. Acute on chronic diastolic CHF with prominent RV dysfunction: COPD, OHS/OSA, and diastolic CHF from valvular disease likely play a role in the RV failure.  Last echo with EF 55-60% with D-shaped septum, severe RV dilation, severe RV dysfunction, mechanical MV with mean gradient 3 mmHg, severe TR. She has had multiple recent admissions.  Admitted with  marked volume overloaded and has required milrinone  in the past for RV support while diuresing. Suspect end stage RV failure.  - CO-OX stable off milrinone .  -  CVP 8-9. Overall diuresed 20 pounds. She should ben able to start torsemide  tomorrow. Restart torsemide  80 mg daily 4/19 -Long discussion about  high sodium foods.  - Continue hold spiro and losartan . Can add at outpatient follow up.  - Continue Farxiga  - Hold beta blocker with recent inotrope support -HF follow up has been set up.   2. Mechanical mitral valve: Stable on 3/25 echo.  INR 3.1 - Warfarin for goal INR 2.5-3.5. Discussed dosing with PharmD  3. Gout: Uric acid 16.  - Already completed several days of prednisone . IM restarted 20 mg prednisone  yesterday for recurrent pain. Planning outpatient taper. Would use caution with longer duration of prednisone  given recurrent volume overload.  Per primary  - Continue colchicine     4. CAD: CTO RCA on 7/22 cath.  - No chest pain.  - Continue statin.   5. Atrial fibrillation/flutter: Paroxysmal. Maintaining SR. On coumadin  as noted above.   6. CKD stage 3:  - SCr 2.7 on admission. Creatinine had gone down to 1.4 but went back up with diuresis, ARB, MRA. Held yesterday. Creatinine coming down.   Tomorrow would start torsemide  120 mg daily.   7. Tricuspid regurgitation: Severe in setting of RV failure.   Length of Stay: 8  Deep Bonawitz, NP  10/08/2023, 8:03 AM  Advanced Heart Failure Team Pager [phone] (M-F; 7a - 5p)  Please contact [facility] for night-coverage after hours (5p -7a ) and weekends on [email or web address]

## 2023-10-08 NOTE — Discharge Summary (Signed)
 Name: Kim Anthony [name]: [id number] DOB: October 19, 1950 73 y.o. PCP: Renne Homans, MD  Date of Admission: 09/29/2023  5:53 PM Date of Discharge:  10/08/2023 Attending Physician: Dr. Jerrell  DISCHARGE DIAGNOSIS:  Primary Problem: Acute on chronic systolic right heart failure [facility])   Hospital Problems: Principal Problem:   Acute on chronic systolic right heart failure (HCC) Active Problems:   COPD (chronic obstructive pulmonary disease) (HCC)   Acute kidney injury (HCC)   S/P mitral valve replacement   Postmenopausal bleeding   Acute idiopathic gout of multiple sites   A-fib [facility])   H/O mitral valve replacement with mechanical valve   Warfarin anticoagulation   Acute on chronic hypoxic respiratory failure (HCC)   Acute kidney injury superimposed on stage 3a chronic kidney disease (HCC)   Chronic acquired lymphedema due to axillary node resection LUE   Periorbital edema of both eyes   Pain around eye, bilateral   Osteoporosis hx of R hip fracture   Severe Vitamin D  deficiency   Normocytic anemia   Abnormal thyroid  function test   Chronic constipation due to chronic opioids    DISCHARGE MEDICATIONS:   Allergies as of 10/08/2023       Reactions   Lisinopril  Swelling   Acetaminophen -codeine Itching   Propoxyphene Itching   Darvocet   Tape Rash   Plastic   Tramadol  Itching, Nausea And Vomiting, Rash        Medication List     PAUSE taking these medications    losartan  25 MG tablet Wait to take this until your doctor or other care provider tells you to start again. Commonly known as: COZAAR  Take 0.5 tablets (12.5 mg total) by mouth daily.   metoprolol  succinate 25 MG 24 hr tablet Wait to take this until your doctor or other care provider tells you to start again. Commonly known as: TOPROL -XL Take 1 tablet (25 mg total) by mouth 2 (two) times daily.       STOP taking these medications    gabapentin  800 MG tablet Commonly known as: Neurontin  Replaced by:  gabapentin  100 MG capsule   tetrahydrozoline 0.05 % ophthalmic solution       TAKE these medications    albuterol  108 (90 Base) MCG/ACT inhaler Commonly known as: VENTOLIN  HFA Inhale 1 puff into the lungs every 4 (four) hours as needed for wheezing or shortness of breath.   colchicine  0.6 MG tablet Take 0.5 tablets (0.3 mg total) by mouth daily.   Farxiga  10 MG Tabs tablet Generic drug: dapagliflozin  propanediol Take 1 tablet by mouth once daily   fluticasone  50 MCG/ACT nasal spray Commonly known as: Flonase  Place 2 sprays into both nostrils daily. What changed: how much to take   gabapentin  100 MG capsule Commonly known as: NEURONTIN  Take 2 capsules (200 mg total) by mouth 3 (three) times daily. Replaces: gabapentin  800 MG tablet   HYDROcodone -acetaminophen  5-325 MG tablet Commonly known as: NORCO/VICODIN Take 1 tablet by mouth every 4 (four) hours as needed for moderate pain (pain score 4-6). Diagnosis- chronic low back pain   hydrOXYzine  10 MG tablet Commonly known as: ATARAX  Take 1 tablet (10 mg total) by mouth 3 (three) times daily as needed for anxiety.   potassium chloride  10 MEQ tablet Commonly known as: KLOR-CON  M Take 2 tablets (20 mEq total) by mouth daily.   predniSONE  10 MG tablet Commonly known as: DELTASONE  Take 1 tablet (10 mg) for 3 days   predniSONE  20 MG tablet Commonly known as: DELTASONE   Take 2 tablets (40 mg) once daily for 2 days, then take 1 tablet (20 mg) once daily for 3 days Start taking on: October 09, 2023   senna-docusate 8.6-50 MG tablet Commonly known as: Senokot-S Take 1 tablet by mouth at bedtime as needed for mild constipation.   simvastatin  20 MG tablet Commonly known as: ZOCOR  Take 1 tablet (20 mg total) by mouth daily.   Torsemide  40 MG Tabs Take 80 mg by mouth daily. What changed:  medication strength how much to take   Trelegy Ellipta  100-62.5-25 MCG/ACT Aepb Generic drug: Fluticasone -Umeclidin-Vilant Inhale 1  puff into the lungs daily.   Vitamin D  (Ergocalciferol ) 1.25 MG (50000 UNIT) Caps capsule Commonly known as: DRISDOL  Take 1 capsule (50,000 Units total) by mouth every 7 (seven) days. Start taking on: October 11, 2023   warfarin 5 MG tablet Commonly known as: COUMADIN  Take as directed. If you are unsure how to take this medication, talk to your nurse or doctor. Original instructions: Take 1 tablet (5 mg total) by mouth daily at 4 PM. What changed:  how much to take how to take this when to take this additional instructions               Durable Medical Equipment  (From admission, onward)           Start     Ordered   10/05/23 1057  Heart failure home health orders  (Heart failure home health orders / Face to face)  Once       Comments: Heart Failure Follow-up Care:  Verify follow-up appointments per Patient Discharge Instructions. Confirm transportation arranged. Reconcile home medications with discharge medication list. Remove discontinued medications from use. Assist patient/caregiver to manage medications using pill box. Reinforce low sodium food selection Assessments: Vital signs and oxygen  saturation at each visit. Assess home environment for safety concerns, caregiver support and availability of low-sodium foods. Consult Child Psychotherapist, PT/OT, Dietitian, and CNA based on assessments. Perform comprehensive cardiopulmonary assessment. Notify MD for any change in condition or weight gain of 3 pounds in one day or 5 pounds in one week with symptoms. Daily Weights and Symptom Monitoring: Ensure patient has access to scales. Teach patient/caregiver to weigh daily before breakfast and after voiding using same scale and record.    Teach patient/caregiver to track weight and symptoms and when to notify Provider. Activity: Develop individualized activity plan with patient/caregiver.   Question Answer Comment  Heart Failure Follow-up Care Advanced Heart Failure (AHF) Clinic  at [phone]   Lab frequency Other see comments   Fax lab results to Other see comments   Diet Low Sodium Heart Healthy   Fluid restrictions: 1800 mL Fluid   Skilled Nurse to notify MD of weight trends weekly for first 2 weeks. May fax or call: AHF Clinic at [phone] (fax) or [phone]      10/05/23 1058            DISPOSITION AND FOLLOW-UP:  Kim Anthony was discharged from [facility] in Stable condition. At the hospital follow up visit please address:  Follow-up Recommendations:  Labs: Basic Metabolic Profile and CBC Medications: Losartan , spironolactone , metoprolol , and allopurinol  should be restarted.  Follow-up Appointments:  Follow-up Information     [facility] Follow up on 10/22/2023.   Specialty: Cardiology Why: Advanced Heart Failure Clinic 9:30 AM Entrance C, Free Valet Parking Contact information: [address] [place]  [phone]  Renne Homans, MD. Go on 10/13/2023.   Specialty: Internal Medicine Why: @1 :45pm and please arrive 15 minutes early andbring photo ID and a list of medications. Contact information: [address] [place] [address] [phone]                 HOSPITAL COURSE:  Patient Summary:  Acute on chronic R heart failure Pulmonary HTN, cor pulmonale Kim Anthony is a 73 year old with a history of A-fib, mitral valve replacement on warfarin, COPD, CKD 3B, congestive heart failure with severely reduced right ventricular ejection fraction, and history of breast cancer s/p L mastectomy who presented to us  with worsening dyspnea and volume overload 2/2 to acute right heart failure excerbation.  On presentation she was volume overloaded on exam and requiring 2 to 3 L of oxygen  through nasal cannula.  She was diuresed with IV Lasix  until her breathing and volume status improved.  She was only taking half of her torsemide  120 mg and  there were concerns about her diet compliance.  The day of her discharge she was able to ambulate without oxygen  with saturation at 90%.  She thus not meet the requirement for oxygen  at home.  Patient was discharged home with home health, PT/OT/RN.  She will be on the following regimen per heart failure : -Take torsemide  80 mg daily -Take Farxiga  10 mg daily - Holding off spironolactone , losartan , in the setting of increased creatinine. - Holding off metoprolol  until follow-up. - Please restart this medications during follow-up, she does not have a follow-up with heart failure until May 2.   #AKI on CKD 3B Creatinine on admission of 2.67 with GFR 18 from baseline of 1.4-1.9. Her Scr improved to 1.92 on discharge.She was making urine and didn't endorse any urinary symptoms.  Please consider follow-up with a BMP.   Afib on [facility] Mechanical mitral Valve INR on presentation was 1.4, PT 17.6, subtherapeutic.  She is being discharged on 5 mg of warfarin a day.  She follows with Dr. Ruthell outpatient.   Polyarticular Gout 2/2 to CKD 3 She has a history of gout and recurrent flares, at this visit namely in her R wrist and L knee was started on dual colchicine  and prednisone .  Discharged with a prednisone  taper.  She should start allopurinol  at her follow-up appointment. - Start on Allopurinol  (renally dosed)  Elevated TSH/T4, resolved periorbital headaches Kim Anthony complained of periorbital headaches and had hemianopsia on exam, however due to her reported kidney function we did not do an MRI.  ACTH  was within normal limits, prolactin was mildly elevated (38.4) but she does not have any signs of galactorrhea, and somatomedin c tests were also conducted and unremarkable.  Her TSH/T4 labs were increased.  TSH was 6.4, T4 was 1.32.  Please follow-up on this in clinic. - Repeat TSH/T4 labs outpatient if abnormal will order an MRI  Discharge Subjective: Kim Anthony was seen at bedside this morning. She was  siting in chair. Reports her anxiety had gotten better with the hydroxyzine  and her breathing has improved as well.      DISCHARGE INSTRUCTIONS:   Discharge Instructions     No wound care   Complete by: As directed        SUBJECTIVE:   Feeling well today.  She is worried that she lost her jacket and had brought her oxygen  machine.  Discharge Vitals:   BP 118/72 (BP Location: Right Arm)   Pulse 83   Temp 97.7 F (  36.5 C) (Oral)   Resp 20   Ht 5' 5 (1.651 m)   Wt 71.8 kg   SpO2 94%   BMI 26.34 kg/m   OBJECTIVE:  Physical Exam Constitutional:      General: She is not in acute distress.    Appearance: She is well-developed. She is not ill-appearing, toxic-appearing or diaphoretic.  Cardiovascular:     Rate and Rhythm: Normal rate.     Comments: Mechanical click heard Pulmonary:     Effort: No tachypnea, accessory muscle usage or respiratory distress.     Breath sounds: Examination of the left-lower field reveals rales. Rales present. No wheezing or rhonchi.  Abdominal:     Palpations: Abdomen is soft.  Musculoskeletal:     Right lower leg: No edema.     Left lower leg: No edema.  Skin:    General: Skin is warm and dry.     Capillary Refill: Capillary refill takes less than 2 seconds.     Coloration: Skin is not cyanotic or pale.     Findings: No rash.  Neurological:     General: No focal deficit present.     Mental Status: She is alert.      Pertinent Labs, Studies, and Procedures:     Latest Ref Rng & Units 10/08/2023    5:00 AM 10/07/2023    3:25 AM 10/06/2023    5:00 AM  CBC  WBC 4.0 - 10.5 K/uL 9.7  6.2  9.5   Hemoglobin 12.0 - 15.0 g/dL 89.3  88.4  88.4   Hematocrit 36.0 - 46.0 % 35.1  38.5  37.0   Platelets 150 - 400 K/uL 484  480  453        Latest Ref Rng & Units 10/08/2023    5:00 AM 10/07/2023    1:30 PM 10/07/2023    3:25 AM  CMP  Glucose 70 - 99 mg/dL 895  830  860   BUN 8 - 23 mg/dL 67  63  60   Creatinine 0.44 - 1.00 mg/dL 8.07  7.86   7.66   Sodium 135 - 145 mmol/L 132  129  130   Potassium 3.5 - 5.1 mmol/L 4.2  4.6  5.5   Chloride 98 - 111 mmol/L 97  95  94   CO2 22 - 32 mmol/L 25  24  25    Calcium  8.9 - 10.3 mg/dL 9.4  9.2  9.5     US  EKG SITE RITE Result Date: 09/30/2023 If Site Rite image not attached, placement could not be confirmed due to current cardiac rhythm.  VAS US  LOWER EXTREMITY VENOUS (DVT) Result Date: 09/30/2023  Lower Venous DVT Study Patient Name:  HAJA CREGO  Date of Exam:   09/30/2023 Medical Rec #: [id number]      Accession #:    [id number] Date of Birth: 1951/03/17       Patient Gender: F Patient Age:   60 years Exam Location:  [facility] Procedure:      VAS US  LOWER EXTREMITY VENOUS (DVT) Referring Phys: JULIE WILLIAMS --------------------------------------------------------------------------------  Indications: Pain.  Risk Factors: Cancer Breast DVT Hx Surgery EGD 08/24/23. Anticoagulation: Warfarin. Limitations: Poor ultrasound/tissue interface. Comparison Study: No significant changes see since previous exam 02/05/22. Performing Technologist: Garnette Rockers  Examination Guidelines: A complete evaluation includes B-mode imaging, spectral Doppler, color Doppler, and power Doppler as needed of all accessible portions of each vessel. Bilateral testing is considered an integral part of a complete examination.  Limited examinations for reoccurring indications may be performed as noted. The reflux portion of the exam is performed with the patient in reverse Trendelenburg.  +-----+---------------+---------+-----------+----------+--------------+ RIGHTCompressibilityPhasicitySpontaneityPropertiesThrombus Aging +-----+---------------+---------+-----------+----------+--------------+ CFV  Full           Yes      Yes                                 +-----+---------------+---------+-----------+----------+--------------+   +---------+---------------+---------+-----------+----------+---------------+ LEFT      CompressibilityPhasicitySpontaneityPropertiesThrombus Aging  +---------+---------------+---------+-----------+----------+---------------+ CFV      Full           Yes      Yes                                  +---------+---------------+---------+-----------+----------+---------------+ SFJ      Full                                                         +---------+---------------+---------+-----------+----------+---------------+ FV Prox  Full                                                         +---------+---------------+---------+-----------+----------+---------------+ FV Mid   Full                    Yes                                  +---------+---------------+---------+-----------+----------+---------------+ FV Distal                        Yes                  Patent by color +---------+---------------+---------+-----------+----------+---------------+ PFV                              Yes                  Patent by color +---------+---------------+---------+-----------+----------+---------------+ POP      Full           Yes      Yes                                  +---------+---------------+---------+-----------+----------+---------------+ PTV      Full                    Yes                                  +---------+---------------+---------+-----------+----------+---------------+ PERO     Full                    Yes                                  +---------+---------------+---------+-----------+----------+---------------+  Summary: RIGHT: - No evidence of common femoral vein obstruction.   LEFT: - There is no evidence of deep vein thrombosis in the lower extremity.  - No cystic structure found in the popliteal fossa.  *See table(s) above for measurements and observations. Electronically signed by Norman Serve on 09/30/2023 at 3:05:48 PM.    Final    DG Chest 2 View Result Date: 09/29/2023 CLINICAL DATA:  Edema, short of breath  EXAM: CHEST - 2 VIEW COMPARISON:  None Available. FINDINGS: Sternotomy wires overlies stable enlarged cardiac silhouette. There is mild patchy airspace disease. There is pleural calcifications in the LEFT upper lobe. Low lung volumes. IMPRESSION: Cardiomegaly and mild pulmonary edema pattern. Pleural calcifications. Electronically Signed   By: Jackquline Boxer M.D.   On: 09/29/2023 21:32     Signed: Cheron Mallard, MD Internal Medicine Resident, PGY-1 [facility]  Pager: [phone] 12:56 PM, 10/08/2023

## 2023-10-08 NOTE — Progress Notes (Signed)
 PHARMACY - ANTICOAGULATION CONSULT NOTE  Pharmacy Consult for warfarin management Indication:  Mitral valve INR target range 2.5 - 3.5  Allergies  Allergen Reactions   Lisinopril  Swelling   Acetaminophen -Codeine Itching   Propoxyphene Itching    Darvocet   Tape Rash    Plastic   Tramadol  Itching, Nausea And Vomiting and Rash    Patient Measurements: Height: 5\' 5"  (165.1 cm) Weight: 71.8 kg (158 lb 4.6 oz) IBW/kg (Calculated) : 57 HEPARIN  DW (KG): 73.1  Vital Signs: Temp: 97.7 F (36.5 C) (04/18 0720) Temp Source: Oral (04/18 0720) BP: 118/72 (04/18 0545) Pulse Rate: 83 (04/18 0801)  Labs: Recent Labs    10/06/23 0500 10/07/23 0325 10/07/23 1330 10/08/23 0500  HGB 11.5* 11.5*  --  10.6*  HCT 37.0 38.5  --  35.1*  PLT 453* 480*  --  484*  LABPROT 30.9* 31.5*  --  31.8*  INR 2.9* 3.0*  --  3.1*  HEPARINUNFRC 0.80*  --   --   --   CREATININE 1.73* 2.33* 2.13* 1.92*    Estimated Creatinine Clearance: 25.9 mL/min (A) (by C-G formula based on SCr of 1.92 mg/dL (H)).   Medical History: Past Medical History:  Diagnosis Date   Acute delirium 08/30/2023   Acute GI bleeding 08/18/2023   Arthritis    Asthma    Breast cancer (HCC) 2001   Left Breast Cancer   CHF (congestive heart failure) (HCC)    COPD (chronic obstructive pulmonary disease) (HCC)    Coronary artery disease    GERD (gastroesophageal reflux disease)    History of mitral valve replacement with mechanical valve    HLD (hyperlipidemia)    Hypertension    Pulmonary embolism (HCC) 2021   Assessment: Pt with MVR who was readmitted for CHF exacerbation. Pharmacy consulted to dose warfarin. INR 1.4 on admission, bridged with IV heparin . Coumadin  continued. Hemoglobin 11.5, platelet count 480. No issues with bleeding reported. Heparin  infusion has been discontinued, having achieved target INR of >/= 2.5 Today, INR  3.1 stabilizing.   Goal of Therapy:  INR 2.5 - 3.5 Monitor platelets by anticoagulation  protocol: Yes   Plan:  Give warfarin 5 mg PO x1 dose Check INR daily while on warfarin Continue to monitor H&H and platelets  For discharge planning, can consider increasing to warfarin 5mg  daily until follow-up anticoag clinic appt sometime next week.   Thank you for allowing pharmacy to be a part of this patient's care.  Claudia Cuff, PharmD, BCPS Clinical Pharmacist

## 2023-10-08 NOTE — Progress Notes (Signed)
 Nurse requested Mobility Specialist to perform oxygen saturation test with pt which includes removing pt from oxygen both at rest and while ambulating.  Below are the results from that testing.     Patient Saturations on Room Air at Rest = spO2 93%  Pa

## 2023-10-08 NOTE — Progress Notes (Signed)
 Central line Removal Note: CVC removed from RIJ. CVC catheter tip visualized and intact. Pressure held until hemostasis achieved. Pressure dressing applied. No redness, ecchymosis, edema, swelling, or drainage noted at site. Instructions provided on post CVC discharge care, including followup notification instructions. Bedrest until 1230

## 2023-10-08 NOTE — Progress Notes (Signed)
 Mobility Specialist Progress Note:   10/08/23 0930  Mobility  Activity Ambulated with assistance in hallway  Level of Assistance Contact guard assist, steadying assist  Assistive Device Four wheel walker  Distance Ambulated (ft) 150 ft  Activity Response Tolerated well  Mobility Referral Yes  Mobility visit 1 Mobility  Mobility Specialist Start Time (ACUTE ONLY) 0930  Mobility Specialist Stop Time (ACUTE ONLY) 0945  Mobility Specialist Time Calculation (min) (ACUTE ONLY) 15 min   Pt agreeable to mobility session. Required only minG assist with ambulation with rollator. VSS on RA, pt back sitting EOB with all needs met.   Oneda Big Mobility Specialist Please contact via SecureChat or  Rehab office at [phone]

## 2023-10-11 ENCOUNTER — Telehealth: Payer: Self-pay | Admitting: *Deleted

## 2023-10-11 ENCOUNTER — Telehealth: Payer: Self-pay

## 2023-10-11 NOTE — Telephone Encounter (Signed)
 Communication  Caller/Agency: maria from [employer] Number: [phone]    Service Requested: Physical Therapy evaluation ,for 10/10/23 , want to reschedule for 10/11/23    Frequency: will call again when evalute to give frequency    Any new concerns about the patient? No

## 2023-10-11 NOTE — Transitions of Care (Post Inpatient/ED Visit) (Signed)
   10/11/2023  Name: Kim Anthony [name]: [id number] DOB: 1950-09-12  Today's TOC FU Call Status: Today's TOC FU Call Status:: Unsuccessful Call (1st Attempt) Unsuccessful Call (1st Attempt) Date: 10/11/23 (patient states not a good day-requests call tomorrow)  Attempted to reach the patient regarding the most recent Inpatient/ED visit.  Follow Up Plan: Additional outreach attempts will be made to reach the patient to complete the Transitions of Care (Post Inpatient/ED visit) call.   Tonia Frankel RN, CCM [place]  VBCI-Population Health RN Care Manager [phone]

## 2023-10-12 ENCOUNTER — Telehealth: Payer: Self-pay

## 2023-10-12 ENCOUNTER — Telehealth: Payer: Self-pay | Admitting: *Deleted

## 2023-10-12 DIAGNOSIS — Z7951 Long term (current) use of inhaled steroids: Secondary | ICD-10-CM | POA: Diagnosis not present

## 2023-10-12 DIAGNOSIS — G8929 Other chronic pain: Secondary | ICD-10-CM | POA: Diagnosis not present

## 2023-10-12 DIAGNOSIS — I2510 Atherosclerotic heart disease of native coronary artery without angina pectoris: Secondary | ICD-10-CM | POA: Diagnosis not present

## 2023-10-12 DIAGNOSIS — K219 Gastro-esophageal reflux disease without esophagitis: Secondary | ICD-10-CM | POA: Diagnosis not present

## 2023-10-12 DIAGNOSIS — Z952 Presence of prosthetic heart valve: Secondary | ICD-10-CM | POA: Diagnosis not present

## 2023-10-12 DIAGNOSIS — J4489 Other specified chronic obstructive pulmonary disease: Secondary | ICD-10-CM | POA: Diagnosis not present

## 2023-10-12 DIAGNOSIS — M1712 Unilateral primary osteoarthritis, left knee: Secondary | ICD-10-CM | POA: Diagnosis not present

## 2023-10-12 DIAGNOSIS — I480 Paroxysmal atrial fibrillation: Secondary | ICD-10-CM | POA: Diagnosis not present

## 2023-10-12 DIAGNOSIS — Z7984 Long term (current) use of oral hypoglycemic drugs: Secondary | ICD-10-CM | POA: Diagnosis not present

## 2023-10-12 DIAGNOSIS — N179 Acute kidney failure, unspecified: Secondary | ICD-10-CM | POA: Diagnosis not present

## 2023-10-12 DIAGNOSIS — D539 Nutritional anemia, unspecified: Secondary | ICD-10-CM | POA: Diagnosis not present

## 2023-10-12 DIAGNOSIS — Z9981 Dependence on supplemental oxygen: Secondary | ICD-10-CM | POA: Diagnosis not present

## 2023-10-12 DIAGNOSIS — I5020 Unspecified systolic (congestive) heart failure: Secondary | ICD-10-CM | POA: Diagnosis not present

## 2023-10-12 DIAGNOSIS — Z7901 Long term (current) use of anticoagulants: Secondary | ICD-10-CM | POA: Diagnosis not present

## 2023-10-12 DIAGNOSIS — I712 Thoracic aortic aneurysm, without rupture, unspecified: Secondary | ICD-10-CM | POA: Diagnosis not present

## 2023-10-12 DIAGNOSIS — I130 Hypertensive heart and chronic kidney disease with heart failure and stage 1 through stage 4 chronic kidney disease, or unspecified chronic kidney disease: Secondary | ICD-10-CM | POA: Diagnosis not present

## 2023-10-12 DIAGNOSIS — N1831 Chronic kidney disease, stage 3a: Secondary | ICD-10-CM | POA: Diagnosis not present

## 2023-10-12 DIAGNOSIS — K922 Gastrointestinal hemorrhage, unspecified: Secondary | ICD-10-CM | POA: Diagnosis not present

## 2023-10-12 DIAGNOSIS — E785 Hyperlipidemia, unspecified: Secondary | ICD-10-CM | POA: Diagnosis not present

## 2023-10-12 DIAGNOSIS — F17210 Nicotine dependence, cigarettes, uncomplicated: Secondary | ICD-10-CM | POA: Diagnosis not present

## 2023-10-12 DIAGNOSIS — M25561 Pain in right knee: Secondary | ICD-10-CM | POA: Diagnosis not present

## 2023-10-12 DIAGNOSIS — K921 Melena: Secondary | ICD-10-CM | POA: Diagnosis not present

## 2023-10-12 DIAGNOSIS — Z7952 Long term (current) use of systemic steroids: Secondary | ICD-10-CM | POA: Diagnosis not present

## 2023-10-12 DIAGNOSIS — Z79899 Other long term (current) drug therapy: Secondary | ICD-10-CM | POA: Diagnosis not present

## 2023-10-12 NOTE — Telephone Encounter (Signed)
 RTC to [facility] < [facility].  Patient has been discharged and needs to retart her PT.  1 time a week for 5 weeks to work on Electrical engineer. Also request for Skilled Nursing to assist patient with new medications. Verbal Approval was given.  Will forward to PCP for agreement or denial.

## 2023-10-12 NOTE — Transitions of Care (Post Inpatient/ED Visit) (Signed)
   10/12/2023  Name: CLEMENTINE SOULLIERE [name]: [id number] DOB: 08-26-1950  Today's TOC FU Call Status:Patient declined review stating home health nurse was already in her home today and "reviewed everything" and declined participation in [facility] 30 day program    Patient's Name and Date of Birth confirmed.   Tonia Frankel RN, CCM [place]  VBCI-Population Health RN Care Manager [phone]

## 2023-10-12 NOTE — Telephone Encounter (Signed)
 Copied from CRM [id number]. Topic: Clinical - Home Health Verbal Orders >> Oct 12, 2023  1:07 PM Hamdi H wrote: Caller/Agency: Marita Sidle [facility] Callback Number: [phone] Service Requested: Physical Therapy, and a nursing evaluation for medication management.  Frequency: 1x5 weeks  Any new concerns about the patient? No

## 2023-10-13 ENCOUNTER — Inpatient Hospital Stay: Payer: Self-pay | Admitting: Student

## 2023-10-13 ENCOUNTER — Ambulatory Visit: Admitting: Internal Medicine

## 2023-10-13 ENCOUNTER — Inpatient Hospital Stay: Admitting: Student

## 2023-10-13 VITALS — BP 127/88 | HR 92 | Temp 99.8°F | Ht 65.0 in | Wt 151.4 lb

## 2023-10-13 DIAGNOSIS — N179 Acute kidney failure, unspecified: Secondary | ICD-10-CM

## 2023-10-13 DIAGNOSIS — L602 Onychogryphosis: Secondary | ICD-10-CM | POA: Diagnosis not present

## 2023-10-13 DIAGNOSIS — L299 Pruritus, unspecified: Secondary | ICD-10-CM | POA: Diagnosis not present

## 2023-10-13 DIAGNOSIS — Z952 Presence of prosthetic heart valve: Secondary | ICD-10-CM | POA: Diagnosis not present

## 2023-10-13 DIAGNOSIS — Z7952 Long term (current) use of systemic steroids: Secondary | ICD-10-CM | POA: Diagnosis not present

## 2023-10-13 DIAGNOSIS — I50813 Acute on chronic right heart failure: Secondary | ICD-10-CM

## 2023-10-13 DIAGNOSIS — K219 Gastro-esophageal reflux disease without esophagitis: Secondary | ICD-10-CM | POA: Diagnosis not present

## 2023-10-13 DIAGNOSIS — I480 Paroxysmal atrial fibrillation: Secondary | ICD-10-CM | POA: Diagnosis not present

## 2023-10-13 DIAGNOSIS — K921 Melena: Secondary | ICD-10-CM | POA: Diagnosis not present

## 2023-10-13 DIAGNOSIS — Z7984 Long term (current) use of oral hypoglycemic drugs: Secondary | ICD-10-CM | POA: Diagnosis not present

## 2023-10-13 DIAGNOSIS — M109 Gout, unspecified: Secondary | ICD-10-CM

## 2023-10-13 DIAGNOSIS — I2510 Atherosclerotic heart disease of native coronary artery without angina pectoris: Secondary | ICD-10-CM | POA: Diagnosis not present

## 2023-10-13 DIAGNOSIS — N1831 Chronic kidney disease, stage 3a: Secondary | ICD-10-CM | POA: Diagnosis not present

## 2023-10-13 DIAGNOSIS — Z79899 Other long term (current) drug therapy: Secondary | ICD-10-CM | POA: Diagnosis not present

## 2023-10-13 DIAGNOSIS — R946 Abnormal results of thyroid function studies: Secondary | ICD-10-CM

## 2023-10-13 DIAGNOSIS — D539 Nutritional anemia, unspecified: Secondary | ICD-10-CM | POA: Diagnosis not present

## 2023-10-13 DIAGNOSIS — Z9981 Dependence on supplemental oxygen: Secondary | ICD-10-CM | POA: Diagnosis not present

## 2023-10-13 DIAGNOSIS — I130 Hypertensive heart and chronic kidney disease with heart failure and stage 1 through stage 4 chronic kidney disease, or unspecified chronic kidney disease: Secondary | ICD-10-CM | POA: Diagnosis not present

## 2023-10-13 DIAGNOSIS — E785 Hyperlipidemia, unspecified: Secondary | ICD-10-CM | POA: Diagnosis not present

## 2023-10-13 DIAGNOSIS — M25561 Pain in right knee: Secondary | ICD-10-CM | POA: Diagnosis not present

## 2023-10-13 DIAGNOSIS — Z7951 Long term (current) use of inhaled steroids: Secondary | ICD-10-CM | POA: Diagnosis not present

## 2023-10-13 DIAGNOSIS — I5042 Chronic combined systolic (congestive) and diastolic (congestive) heart failure: Secondary | ICD-10-CM

## 2023-10-13 DIAGNOSIS — F17210 Nicotine dependence, cigarettes, uncomplicated: Secondary | ICD-10-CM | POA: Diagnosis not present

## 2023-10-13 DIAGNOSIS — G8929 Other chronic pain: Secondary | ICD-10-CM | POA: Diagnosis not present

## 2023-10-13 DIAGNOSIS — I5020 Unspecified systolic (congestive) heart failure: Secondary | ICD-10-CM | POA: Diagnosis not present

## 2023-10-13 DIAGNOSIS — K922 Gastrointestinal hemorrhage, unspecified: Secondary | ICD-10-CM | POA: Diagnosis not present

## 2023-10-13 DIAGNOSIS — I5041 Acute combined systolic (congestive) and diastolic (congestive) heart failure: Secondary | ICD-10-CM | POA: Diagnosis not present

## 2023-10-13 DIAGNOSIS — Z7901 Long term (current) use of anticoagulants: Secondary | ICD-10-CM | POA: Diagnosis not present

## 2023-10-13 DIAGNOSIS — M1712 Unilateral primary osteoarthritis, left knee: Secondary | ICD-10-CM | POA: Diagnosis not present

## 2023-10-13 DIAGNOSIS — J4489 Other specified chronic obstructive pulmonary disease: Secondary | ICD-10-CM | POA: Diagnosis not present

## 2023-10-13 DIAGNOSIS — I712 Thoracic aortic aneurysm, without rupture, unspecified: Secondary | ICD-10-CM | POA: Diagnosis not present

## 2023-10-13 MED ORDER — ALLOPURINOL 100 MG PO TABS: 50.0000 mg | ORAL_TABLET | ORAL | 2 refills | Status: DC

## 2023-10-13 NOTE — Progress Notes (Unsigned)
   CC: HFU  HPI:  [name] is a 73 y.o. female who presents for hospital follow up. Please see problem based charting for detailed assessment and plan.  Past Medical History:  Diagnosis Date   Acute delirium 08/30/2023   Acute GI bleeding 08/18/2023   Arthritis    Asthma    Breast cancer (HCC) 2001   Left Breast Cancer   CHF (congestive heart failure) (HCC)    Closed fracture of right hip (HCC) 09/23/2022   Closed intertrochanteric fracture of right femur, initial encounter (HCC) 09/22/2022   COPD (chronic obstructive pulmonary disease) (HCC)    Coronary artery disease    GERD (gastroesophageal reflux disease)    History of mitral valve prosthesis 07/31/2023   History of mitral valve replacement with mechanical valve    HLD (hyperlipidemia)    Hypertension    Personal history of breast cancer    Left Breast Cancer     Pulmonary embolism (HCC) 2021   Review of Systems:  Negative unless otherwise stated.  Physical Exam:  Vitals:   10/13/23 1508  BP: 127/88  Pulse: 92  Temp: 99.8 F (37.7 C)  TempSrc: Oral  SpO2: 96%  Weight: 151 lb 6.4 oz (68.7 kg)  Height: 5\' 5"  (1.651 m)   Constitutional:Appears stated age, generally well. In no acute distress. Cardio:Regular rate and rhythm.  Pulm:Clear to auscultation bilaterally. Normal work of breathing on room air. Abdomen:Soft, nontender, nondistended. EAV:[id number] for extremity edema. Skin:Warm and dry. Neuro:Alert and oriented x3. No focal deficit noted. Psych:Pleasant mood and affect.  Assessment & Plan:   See Encounters Tab for problem based charting.  Arthritis of right wrist due to gout  >>ASSESSMENT AND PLAN FOR GOUT WRITTEN ON 10/14/2023  9:51 AM BY Lenoard Helbert, DO  Treated for acute flare during recent hospitalization. Flare symptoms have resolved. She will finish prednisone  taper in 3 days. Plan: Start allopurinol  50 mg every other day.  Combined systolic and diastolic congestive heart failure  (HCC) Patient recently hospitalized with acute heart failure exacerbation complicated by pulmonary hypertension and cor pulmonale. She underwent appropriate diuresis and was discharged on part of her GDMT--torsemide , farxiga --however losartan  and metoprolol  were held due to concern for AKI. On exam today she is euvolemic. VSS, saturating well on room air.  Plan: Continue torsemide  80 mg daily, farxiga  10 mg daily. Resume metoprolol  25 mg BID, losartan  12.5 mg daily. Follow up in 2 weeks for nursing BP recheck.  Acute kidney injury superimposed on stage 3a chronic kidney disease (HCC) Plan: BMP today.  Itch of skin Patient voices concern of itchy skin diffusely. She has no rashes. She says daily she uses a vitamin E cream and alcohol on her skin. She uses heavily scented perfumes as well. Plan: Counseled patient to stop using rubbing alcohol on skin. I have advised that she start using a thick, unscented lotion that comes from a jar or tube and not a bottle with a pump.  Thickened nails Plan: Referral to podiatry placed.  Abnormal thyroid  function test During recent hospitalization. Will need repeat labs in 4-6 weeks.   Patient discussed with Dr. Adriane Albe

## 2023-10-13 NOTE — Patient Instructions (Signed)
 Kim Anthony,  It was a pleasure to care for you today!  Please restart your metoprolol  and losartan . Continue your torsemide  and farxiga  as well.  I am starting you on a medicine for your gout called allopurinol . You will take 50 mg every other day. You can go ahead and start this medicine now.  Remember: use a thick moisturizing lotion over your skin. No alcohol on the skin!  You will receive a phone call from podiatry to schedule your appointment.  Please follow up for a nursing visit in 2 weeks to check your blood pressure.  I will let you know what your labs show.  My best, Dr. Rozelle Corning

## 2023-10-14 ENCOUNTER — Encounter: Payer: Self-pay | Admitting: Internal Medicine

## 2023-10-14 ENCOUNTER — Telehealth: Payer: Self-pay | Admitting: *Deleted

## 2023-10-14 ENCOUNTER — Other Ambulatory Visit: Payer: Self-pay | Admitting: Student

## 2023-10-14 DIAGNOSIS — M25561 Pain in right knee: Secondary | ICD-10-CM | POA: Diagnosis not present

## 2023-10-14 DIAGNOSIS — L299 Pruritus, unspecified: Secondary | ICD-10-CM | POA: Insufficient documentation

## 2023-10-14 DIAGNOSIS — K921 Melena: Secondary | ICD-10-CM | POA: Diagnosis not present

## 2023-10-14 DIAGNOSIS — Z7951 Long term (current) use of inhaled steroids: Secondary | ICD-10-CM | POA: Diagnosis not present

## 2023-10-14 DIAGNOSIS — E785 Hyperlipidemia, unspecified: Secondary | ICD-10-CM | POA: Diagnosis not present

## 2023-10-14 DIAGNOSIS — Z9981 Dependence on supplemental oxygen: Secondary | ICD-10-CM | POA: Diagnosis not present

## 2023-10-14 DIAGNOSIS — Z952 Presence of prosthetic heart valve: Secondary | ICD-10-CM | POA: Diagnosis not present

## 2023-10-14 DIAGNOSIS — I130 Hypertensive heart and chronic kidney disease with heart failure and stage 1 through stage 4 chronic kidney disease, or unspecified chronic kidney disease: Secondary | ICD-10-CM | POA: Diagnosis not present

## 2023-10-14 DIAGNOSIS — G8929 Other chronic pain: Secondary | ICD-10-CM | POA: Diagnosis not present

## 2023-10-14 DIAGNOSIS — I480 Paroxysmal atrial fibrillation: Secondary | ICD-10-CM | POA: Diagnosis not present

## 2023-10-14 DIAGNOSIS — Z7952 Long term (current) use of systemic steroids: Secondary | ICD-10-CM | POA: Diagnosis not present

## 2023-10-14 DIAGNOSIS — K922 Gastrointestinal hemorrhage, unspecified: Secondary | ICD-10-CM | POA: Diagnosis not present

## 2023-10-14 DIAGNOSIS — I2510 Atherosclerotic heart disease of native coronary artery without angina pectoris: Secondary | ICD-10-CM | POA: Diagnosis not present

## 2023-10-14 DIAGNOSIS — F17210 Nicotine dependence, cigarettes, uncomplicated: Secondary | ICD-10-CM | POA: Diagnosis not present

## 2023-10-14 DIAGNOSIS — Z7984 Long term (current) use of oral hypoglycemic drugs: Secondary | ICD-10-CM | POA: Diagnosis not present

## 2023-10-14 DIAGNOSIS — J4489 Other specified chronic obstructive pulmonary disease: Secondary | ICD-10-CM | POA: Diagnosis not present

## 2023-10-14 DIAGNOSIS — M1712 Unilateral primary osteoarthritis, left knee: Secondary | ICD-10-CM | POA: Diagnosis not present

## 2023-10-14 DIAGNOSIS — Z79899 Other long term (current) drug therapy: Secondary | ICD-10-CM | POA: Diagnosis not present

## 2023-10-14 DIAGNOSIS — D539 Nutritional anemia, unspecified: Secondary | ICD-10-CM | POA: Diagnosis not present

## 2023-10-14 DIAGNOSIS — I5020 Unspecified systolic (congestive) heart failure: Secondary | ICD-10-CM | POA: Diagnosis not present

## 2023-10-14 DIAGNOSIS — K219 Gastro-esophageal reflux disease without esophagitis: Secondary | ICD-10-CM | POA: Diagnosis not present

## 2023-10-14 DIAGNOSIS — L602 Onychogryphosis: Secondary | ICD-10-CM | POA: Insufficient documentation

## 2023-10-14 DIAGNOSIS — N1831 Chronic kidney disease, stage 3a: Secondary | ICD-10-CM | POA: Diagnosis not present

## 2023-10-14 DIAGNOSIS — M109 Gout, unspecified: Secondary | ICD-10-CM | POA: Insufficient documentation

## 2023-10-14 DIAGNOSIS — I712 Thoracic aortic aneurysm, without rupture, unspecified: Secondary | ICD-10-CM | POA: Diagnosis not present

## 2023-10-14 DIAGNOSIS — N179 Acute kidney failure, unspecified: Secondary | ICD-10-CM | POA: Diagnosis not present

## 2023-10-14 DIAGNOSIS — Z7901 Long term (current) use of anticoagulants: Secondary | ICD-10-CM | POA: Diagnosis not present

## 2023-10-14 LAB — BMP8+ANION GAP
Anion Gap: 19 mmol/L — ABNORMAL HIGH (ref 10.0–18.0)
BUN/Creatinine Ratio: 38 — ABNORMAL HIGH (ref 12–28)
BUN: 70 mg/dL — ABNORMAL HIGH (ref 8–27)
CO2: 18 mmol/L — ABNORMAL LOW (ref 20–29)
Calcium: 10 mg/dL (ref 8.7–10.3)
Chloride: 97 mmol/L (ref 96–106)
Creatinine, Ser: 1.85 mg/dL — ABNORMAL HIGH (ref 0.57–1.00)
Glucose: 94 mg/dL (ref 70–99)
Potassium: 5 mmol/L (ref 3.5–5.2)
Sodium: 134 mmol/L (ref 134–144)
eGFR: 28 mL/min/{1.73_m2} — ABNORMAL LOW (ref >59)

## 2023-10-14 NOTE — Telephone Encounter (Signed)
 Copied from CRM [id number]. Topic: Clinical - Home Health Verbal Orders >> Oct 14, 2023 12:21 PM Blair Bumpers wrote: Caller/Agency: Dina Francisco, Nurse with [facility] Callback Number: [phone] Service Requested: Skilled Nursing add on  Frequency: 1 week five & 2 PRNs Any new concerns about the patient? No

## 2023-10-14 NOTE — Telephone Encounter (Signed)
 RTC to [facility].  Requesting Verbal Orders fr Skilled nursing for patient  1 time a week for 5 weeks and then 2 PRN visits.  Will work on Medication and Disease Management.  Verbal approval was given will forward to PCP for agreement or Denial.

## 2023-10-14 NOTE — Assessment & Plan Note (Signed)
Plan BMP today

## 2023-10-14 NOTE — Assessment & Plan Note (Signed)
 Patient recently hospitalized with acute heart failure exacerbation complicated by pulmonary hypertension and cor pulmonale. She underwent appropriate diuresis and was discharged on part of her GDMT--torsemide , farxiga --however losartan  and metoprolol  were held due to concern for AKI. On exam today she is euvolemic. VSS, saturating well on room air.  Plan: Continue torsemide  80 mg daily, farxiga  10 mg daily. Resume metoprolol  25 mg BID, losartan  12.5 mg daily. Follow up in 2 weeks for nursing BP recheck.

## 2023-10-14 NOTE — Assessment & Plan Note (Addendum)
 During recent hospitalization. Will need repeat labs in 4-6 weeks.

## 2023-10-14 NOTE — Assessment & Plan Note (Signed)
 Plan: Referral to podiatry placed.

## 2023-10-14 NOTE — Progress Notes (Signed)
 Opened in error

## 2023-10-14 NOTE — Assessment & Plan Note (Signed)
>>  ASSESSMENT AND PLAN FOR GOUT WRITTEN ON 10/14/2023  9:51 AM BY Tamitha Norell, DO  Treated for acute flare during recent hospitalization. Flare symptoms have resolved. She will finish prednisone  taper in 3 days. Plan: Start allopurinol  50 mg every other day.

## 2023-10-14 NOTE — Assessment & Plan Note (Signed)
 Treated for acute flare during recent hospitalization. Flare symptoms have resolved. She will finish prednisone  taper in 3 days. Plan: Start allopurinol  50 mg every other day.

## 2023-10-14 NOTE — Assessment & Plan Note (Signed)
 Patient voices concern of itchy skin diffusely. She has no rashes. She says daily she uses a vitamin E cream and alcohol on her skin. She uses heavily scented perfumes as well. Plan: Counseled patient to stop using rubbing alcohol on skin. I have advised that she start using a thick, unscented lotion that comes from a jar or tube and not a bottle with a pump.

## 2023-10-15 ENCOUNTER — Other Ambulatory Visit: Payer: Self-pay | Admitting: Student

## 2023-10-15 NOTE — Telephone Encounter (Signed)
 Copied from CRM [id number]. Topic: Clinical - Medication Refill >> Oct 15, 2023 11:08 AM Retta Caster wrote: Most Recent Primary Care Visit:  Provider: Carleen Chary  Department: [facility]  Visit Type: OPEN ESTABLISHED  Date: 09/14/2023  Medication: hydrOXYzine  (ATARAX ) 10 MG tablet   Has the patient contacted their pharmacy? Yes (Agent: If no, request that the patient contact the pharmacy for the refill. If patient does not wish to contact the pharmacy document the reason why and proceed with request.) (Agent: If yes, when and what did the pharmacy advise?)  Is this the correct pharmacy for this prescription? Yes If no, delete pharmacy and type the correct one.  This is the patient's preferred pharmacy:  [facility] [address] [place], [place] - [address] [place] [address] Phone: [phone] Fax: [phone]   Has the prescription been filled recently? Yes  Is the patient out of the medication? Yes  Has the patient been seen for an appointment in the last year OR does the patient have an upcoming appointment? Yes  Can we respond through MyChart? Yes  Agent: Please be advised that Rx refills may take up to 3 business days. We ask that you follow-up with your pharmacy.

## 2023-10-18 ENCOUNTER — Other Ambulatory Visit: Payer: Self-pay

## 2023-10-18 ENCOUNTER — Emergency Department (HOSPITAL_COMMUNITY)

## 2023-10-18 ENCOUNTER — Inpatient Hospital Stay (HOSPITAL_COMMUNITY)
Admission: EM | Admit: 2023-10-18 | Discharge: 2023-10-26 | DRG: 871 | Disposition: A | Discharge status: Home Health | Attending: Infectious Diseases | Admitting: Infectious Diseases

## 2023-10-18 DIAGNOSIS — Z79891 Long term (current) use of opiate analgesic: Secondary | ICD-10-CM

## 2023-10-18 DIAGNOSIS — I2721 Secondary pulmonary arterial hypertension: Secondary | ICD-10-CM | POA: Diagnosis not present

## 2023-10-18 DIAGNOSIS — I5040 Unspecified combined systolic (congestive) and diastolic (congestive) heart failure: Secondary | ICD-10-CM | POA: Diagnosis present

## 2023-10-18 DIAGNOSIS — K219 Gastro-esophageal reflux disease without esophagitis: Secondary | ICD-10-CM | POA: Diagnosis present

## 2023-10-18 DIAGNOSIS — R0602 Shortness of breath: Secondary | ICD-10-CM | POA: Diagnosis not present

## 2023-10-18 DIAGNOSIS — N1831 Chronic kidney disease, stage 3a: Secondary | ICD-10-CM | POA: Diagnosis present

## 2023-10-18 DIAGNOSIS — Z853 Personal history of malignant neoplasm of breast: Secondary | ICD-10-CM

## 2023-10-18 DIAGNOSIS — F17210 Nicotine dependence, cigarettes, uncomplicated: Secondary | ICD-10-CM | POA: Diagnosis present

## 2023-10-18 DIAGNOSIS — R5381 Other malaise: Secondary | ICD-10-CM

## 2023-10-18 DIAGNOSIS — Z8249 Family history of ischemic heart disease and other diseases of the circulatory system: Secondary | ICD-10-CM

## 2023-10-18 DIAGNOSIS — M109 Gout, unspecified: Secondary | ICD-10-CM | POA: Diagnosis present

## 2023-10-18 DIAGNOSIS — Z7984 Long term (current) use of oral hypoglycemic drugs: Secondary | ICD-10-CM

## 2023-10-18 DIAGNOSIS — E1122 Type 2 diabetes mellitus with diabetic chronic kidney disease: Secondary | ICD-10-CM | POA: Diagnosis not present

## 2023-10-18 DIAGNOSIS — E871 Hypo-osmolality and hyponatremia: Secondary | ICD-10-CM | POA: Diagnosis present

## 2023-10-18 DIAGNOSIS — D72829 Elevated white blood cell count, unspecified: Secondary | ICD-10-CM | POA: Diagnosis not present

## 2023-10-18 DIAGNOSIS — I4891 Unspecified atrial fibrillation: Secondary | ICD-10-CM | POA: Diagnosis present

## 2023-10-18 DIAGNOSIS — A419 Sepsis, unspecified organism: Secondary | ICD-10-CM | POA: Diagnosis not present

## 2023-10-18 DIAGNOSIS — N1832 Chronic kidney disease, stage 3b: Secondary | ICD-10-CM | POA: Diagnosis not present

## 2023-10-18 DIAGNOSIS — Z79899 Other long term (current) drug therapy: Secondary | ICD-10-CM

## 2023-10-18 DIAGNOSIS — Z9981 Dependence on supplemental oxygen: Secondary | ICD-10-CM

## 2023-10-18 DIAGNOSIS — J449 Chronic obstructive pulmonary disease, unspecified: Secondary | ICD-10-CM | POA: Diagnosis not present

## 2023-10-18 DIAGNOSIS — I5033 Acute on chronic diastolic (congestive) heart failure: Secondary | ICD-10-CM | POA: Diagnosis not present

## 2023-10-18 DIAGNOSIS — E875 Hyperkalemia: Secondary | ICD-10-CM | POA: Diagnosis present

## 2023-10-18 DIAGNOSIS — Z86711 Personal history of pulmonary embolism: Secondary | ICD-10-CM

## 2023-10-18 DIAGNOSIS — N179 Acute kidney failure, unspecified: Secondary | ICD-10-CM | POA: Diagnosis not present

## 2023-10-18 DIAGNOSIS — N19 Unspecified kidney failure: Secondary | ICD-10-CM | POA: Insufficient documentation

## 2023-10-18 DIAGNOSIS — R6521 Severe sepsis with septic shock: Secondary | ICD-10-CM | POA: Diagnosis present

## 2023-10-18 DIAGNOSIS — J9610 Chronic respiratory failure, unspecified whether with hypoxia or hypercapnia: Secondary | ICD-10-CM | POA: Diagnosis present

## 2023-10-18 DIAGNOSIS — I130 Hypertensive heart and chronic kidney disease with heart failure and stage 1 through stage 4 chronic kidney disease, or unspecified chronic kidney disease: Secondary | ICD-10-CM | POA: Diagnosis present

## 2023-10-18 DIAGNOSIS — K1120 Sialoadenitis, unspecified: Secondary | ICD-10-CM | POA: Diagnosis present

## 2023-10-18 DIAGNOSIS — R791 Abnormal coagulation profile: Secondary | ICD-10-CM | POA: Diagnosis present

## 2023-10-18 DIAGNOSIS — Z9012 Acquired absence of left breast and nipple: Secondary | ICD-10-CM

## 2023-10-18 DIAGNOSIS — Z91048 Other nonmedicinal substance allergy status: Secondary | ICD-10-CM

## 2023-10-18 DIAGNOSIS — Z923 Personal history of irradiation: Secondary | ICD-10-CM

## 2023-10-18 DIAGNOSIS — Z1152 Encounter for screening for COVID-19: Secondary | ICD-10-CM

## 2023-10-18 DIAGNOSIS — D631 Anemia in chronic kidney disease: Secondary | ICD-10-CM | POA: Diagnosis not present

## 2023-10-18 DIAGNOSIS — I10 Essential (primary) hypertension: Secondary | ICD-10-CM | POA: Diagnosis not present

## 2023-10-18 DIAGNOSIS — I5082 Biventricular heart failure: Secondary | ICD-10-CM | POA: Diagnosis not present

## 2023-10-18 DIAGNOSIS — F05 Delirium due to known physiological condition: Secondary | ICD-10-CM | POA: Diagnosis not present

## 2023-10-18 DIAGNOSIS — K5909 Other constipation: Secondary | ICD-10-CM | POA: Diagnosis present

## 2023-10-18 DIAGNOSIS — J4489 Other specified chronic obstructive pulmonary disease: Secondary | ICD-10-CM | POA: Diagnosis not present

## 2023-10-18 DIAGNOSIS — M542 Cervicalgia: Secondary | ICD-10-CM | POA: Diagnosis not present

## 2023-10-18 DIAGNOSIS — Z952 Presence of prosthetic heart valve: Secondary | ICD-10-CM

## 2023-10-18 DIAGNOSIS — I959 Hypotension, unspecified: Secondary | ICD-10-CM | POA: Diagnosis not present

## 2023-10-18 DIAGNOSIS — Z809 Family history of malignant neoplasm, unspecified: Secondary | ICD-10-CM

## 2023-10-18 DIAGNOSIS — R6520 Severe sepsis without septic shock: Secondary | ICD-10-CM | POA: Diagnosis not present

## 2023-10-18 DIAGNOSIS — J9621 Acute and chronic respiratory failure with hypoxia: Secondary | ICD-10-CM | POA: Diagnosis present

## 2023-10-18 DIAGNOSIS — I2781 Cor pulmonale (chronic): Secondary | ICD-10-CM | POA: Diagnosis not present

## 2023-10-18 DIAGNOSIS — Z888 Allergy status to other drugs, medicaments and biological substances status: Secondary | ICD-10-CM

## 2023-10-18 DIAGNOSIS — E119 Type 2 diabetes mellitus without complications: Secondary | ICD-10-CM | POA: Diagnosis not present

## 2023-10-18 DIAGNOSIS — E872 Acidosis, unspecified: Secondary | ICD-10-CM | POA: Diagnosis present

## 2023-10-18 DIAGNOSIS — Z885 Allergy status to narcotic agent status: Secondary | ICD-10-CM

## 2023-10-18 DIAGNOSIS — Z803 Family history of malignant neoplasm of breast: Secondary | ICD-10-CM

## 2023-10-18 DIAGNOSIS — Z515 Encounter for palliative care: Secondary | ICD-10-CM | POA: Diagnosis not present

## 2023-10-18 DIAGNOSIS — I5042 Chronic combined systolic (congestive) and diastolic (congestive) heart failure: Secondary | ICD-10-CM | POA: Diagnosis not present

## 2023-10-18 DIAGNOSIS — Z7901 Long term (current) use of anticoagulants: Secondary | ICD-10-CM | POA: Diagnosis not present

## 2023-10-18 DIAGNOSIS — G9341 Metabolic encephalopathy: Secondary | ICD-10-CM | POA: Diagnosis present

## 2023-10-18 DIAGNOSIS — I5032 Chronic diastolic (congestive) heart failure: Secondary | ICD-10-CM | POA: Diagnosis not present

## 2023-10-18 DIAGNOSIS — G8929 Other chronic pain: Secondary | ICD-10-CM | POA: Diagnosis present

## 2023-10-18 DIAGNOSIS — I50810 Right heart failure, unspecified: Secondary | ICD-10-CM | POA: Diagnosis not present

## 2023-10-18 DIAGNOSIS — M549 Dorsalgia, unspecified: Secondary | ICD-10-CM | POA: Diagnosis present

## 2023-10-18 DIAGNOSIS — E876 Hypokalemia: Secondary | ICD-10-CM | POA: Diagnosis not present

## 2023-10-18 DIAGNOSIS — G4700 Insomnia, unspecified: Secondary | ICD-10-CM | POA: Diagnosis present

## 2023-10-18 DIAGNOSIS — R4182 Altered mental status, unspecified: Secondary | ICD-10-CM | POA: Diagnosis present

## 2023-10-18 DIAGNOSIS — G9340 Encephalopathy, unspecified: Secondary | ICD-10-CM | POA: Diagnosis not present

## 2023-10-18 DIAGNOSIS — J949 Pleural condition, unspecified: Secondary | ICD-10-CM | POA: Diagnosis not present

## 2023-10-18 DIAGNOSIS — I340 Nonrheumatic mitral (valve) insufficiency: Secondary | ICD-10-CM | POA: Diagnosis not present

## 2023-10-18 DIAGNOSIS — I890 Lymphedema, not elsewhere classified: Secondary | ICD-10-CM | POA: Diagnosis present

## 2023-10-18 DIAGNOSIS — I5022 Chronic systolic (congestive) heart failure: Secondary | ICD-10-CM | POA: Diagnosis not present

## 2023-10-18 DIAGNOSIS — E785 Hyperlipidemia, unspecified: Secondary | ICD-10-CM | POA: Diagnosis not present

## 2023-10-18 DIAGNOSIS — Z91199 Patient's noncompliance with other medical treatment and regimen due to unspecified reason: Secondary | ICD-10-CM

## 2023-10-18 DIAGNOSIS — Z7189 Other specified counseling: Secondary | ICD-10-CM | POA: Diagnosis not present

## 2023-10-18 DIAGNOSIS — Z7951 Long term (current) use of inhaled steroids: Secondary | ICD-10-CM

## 2023-10-18 DIAGNOSIS — J9611 Chronic respiratory failure with hypoxia: Secondary | ICD-10-CM | POA: Diagnosis not present

## 2023-10-18 DIAGNOSIS — I2510 Atherosclerotic heart disease of native coronary artery without angina pectoris: Secondary | ICD-10-CM | POA: Diagnosis present

## 2023-10-18 NOTE — ED Notes (Signed)
 Pt to xray

## 2023-10-18 NOTE — ED Triage Notes (Signed)
 Pt arrives with daughter due to SOB, neck pain denies injury. Pt uses O2 at home as needed but forgot to charge machine today and ran out of O2. Sats 89% on arrival. Pt placed on 2l [place]

## 2023-10-19 ENCOUNTER — Inpatient Hospital Stay (HOSPITAL_COMMUNITY)

## 2023-10-19 ENCOUNTER — Encounter (HOSPITAL_COMMUNITY): Payer: Self-pay | Admitting: Internal Medicine

## 2023-10-19 ENCOUNTER — Other Ambulatory Visit: Payer: Self-pay

## 2023-10-19 DIAGNOSIS — Z1152 Encounter for screening for COVID-19: Secondary | ICD-10-CM | POA: Diagnosis not present

## 2023-10-19 DIAGNOSIS — I130 Hypertensive heart and chronic kidney disease with heart failure and stage 1 through stage 4 chronic kidney disease, or unspecified chronic kidney disease: Secondary | ICD-10-CM | POA: Diagnosis present

## 2023-10-19 DIAGNOSIS — Z952 Presence of prosthetic heart valve: Secondary | ICD-10-CM | POA: Diagnosis not present

## 2023-10-19 DIAGNOSIS — J449 Chronic obstructive pulmonary disease, unspecified: Secondary | ICD-10-CM | POA: Diagnosis not present

## 2023-10-19 DIAGNOSIS — N179 Acute kidney failure, unspecified: Principal | ICD-10-CM

## 2023-10-19 DIAGNOSIS — I2721 Secondary pulmonary arterial hypertension: Secondary | ICD-10-CM | POA: Diagnosis present

## 2023-10-19 DIAGNOSIS — G9340 Encephalopathy, unspecified: Secondary | ICD-10-CM | POA: Diagnosis not present

## 2023-10-19 DIAGNOSIS — I4891 Unspecified atrial fibrillation: Secondary | ICD-10-CM

## 2023-10-19 DIAGNOSIS — I50810 Right heart failure, unspecified: Secondary | ICD-10-CM

## 2023-10-19 DIAGNOSIS — Z9981 Dependence on supplemental oxygen: Secondary | ICD-10-CM | POA: Diagnosis not present

## 2023-10-19 DIAGNOSIS — J9611 Chronic respiratory failure with hypoxia: Secondary | ICD-10-CM | POA: Diagnosis not present

## 2023-10-19 DIAGNOSIS — G9341 Metabolic encephalopathy: Secondary | ICD-10-CM | POA: Diagnosis present

## 2023-10-19 DIAGNOSIS — I5022 Chronic systolic (congestive) heart failure: Secondary | ICD-10-CM | POA: Diagnosis not present

## 2023-10-19 DIAGNOSIS — D72829 Elevated white blood cell count, unspecified: Secondary | ICD-10-CM | POA: Diagnosis not present

## 2023-10-19 DIAGNOSIS — A419 Sepsis, unspecified organism: Secondary | ICD-10-CM | POA: Diagnosis present

## 2023-10-19 DIAGNOSIS — I959 Hypotension, unspecified: Secondary | ICD-10-CM

## 2023-10-19 DIAGNOSIS — D631 Anemia in chronic kidney disease: Secondary | ICD-10-CM | POA: Diagnosis present

## 2023-10-19 DIAGNOSIS — D649 Anemia, unspecified: Secondary | ICD-10-CM | POA: Diagnosis not present

## 2023-10-19 DIAGNOSIS — E871 Hypo-osmolality and hyponatremia: Secondary | ICD-10-CM | POA: Diagnosis present

## 2023-10-19 DIAGNOSIS — J9621 Acute and chronic respiratory failure with hypoxia: Secondary | ICD-10-CM

## 2023-10-19 DIAGNOSIS — E872 Acidosis, unspecified: Secondary | ICD-10-CM | POA: Diagnosis present

## 2023-10-19 DIAGNOSIS — R6521 Severe sepsis with septic shock: Secondary | ICD-10-CM | POA: Diagnosis present

## 2023-10-19 DIAGNOSIS — I10 Essential (primary) hypertension: Secondary | ICD-10-CM

## 2023-10-19 DIAGNOSIS — N1832 Chronic kidney disease, stage 3b: Secondary | ICD-10-CM | POA: Diagnosis present

## 2023-10-19 DIAGNOSIS — E1122 Type 2 diabetes mellitus with diabetic chronic kidney disease: Secondary | ICD-10-CM | POA: Diagnosis present

## 2023-10-19 DIAGNOSIS — E785 Hyperlipidemia, unspecified: Secondary | ICD-10-CM | POA: Diagnosis present

## 2023-10-19 DIAGNOSIS — I5033 Acute on chronic diastolic (congestive) heart failure: Secondary | ICD-10-CM

## 2023-10-19 DIAGNOSIS — I5032 Chronic diastolic (congestive) heart failure: Secondary | ICD-10-CM | POA: Diagnosis not present

## 2023-10-19 DIAGNOSIS — E875 Hyperkalemia: Secondary | ICD-10-CM | POA: Diagnosis present

## 2023-10-19 DIAGNOSIS — I2781 Cor pulmonale (chronic): Secondary | ICD-10-CM | POA: Diagnosis present

## 2023-10-19 DIAGNOSIS — I5040 Unspecified combined systolic (congestive) and diastolic (congestive) heart failure: Secondary | ICD-10-CM | POA: Diagnosis not present

## 2023-10-19 DIAGNOSIS — I5042 Chronic combined systolic (congestive) and diastolic (congestive) heart failure: Secondary | ICD-10-CM | POA: Diagnosis present

## 2023-10-19 DIAGNOSIS — Z515 Encounter for palliative care: Secondary | ICD-10-CM | POA: Diagnosis not present

## 2023-10-19 DIAGNOSIS — N19 Unspecified kidney failure: Secondary | ICD-10-CM | POA: Insufficient documentation

## 2023-10-19 DIAGNOSIS — R6520 Severe sepsis without septic shock: Secondary | ICD-10-CM | POA: Diagnosis not present

## 2023-10-19 DIAGNOSIS — I5082 Biventricular heart failure: Secondary | ICD-10-CM | POA: Diagnosis present

## 2023-10-19 DIAGNOSIS — N1831 Chronic kidney disease, stage 3a: Secondary | ICD-10-CM | POA: Diagnosis not present

## 2023-10-19 DIAGNOSIS — Z7901 Long term (current) use of anticoagulants: Secondary | ICD-10-CM | POA: Diagnosis not present

## 2023-10-19 DIAGNOSIS — R4182 Altered mental status, unspecified: Secondary | ICD-10-CM | POA: Diagnosis present

## 2023-10-19 DIAGNOSIS — F05 Delirium due to known physiological condition: Secondary | ICD-10-CM | POA: Diagnosis not present

## 2023-10-19 DIAGNOSIS — Z7189 Other specified counseling: Secondary | ICD-10-CM | POA: Diagnosis not present

## 2023-10-19 DIAGNOSIS — E119 Type 2 diabetes mellitus without complications: Secondary | ICD-10-CM | POA: Diagnosis not present

## 2023-10-19 DIAGNOSIS — J4489 Other specified chronic obstructive pulmonary disease: Secondary | ICD-10-CM | POA: Diagnosis present

## 2023-10-19 LAB — RENAL FUNCTION PANEL
Albumin: 3.5 g/dL (ref 3.5–5.0)
Albumin: 3 g/dL — ABNORMAL LOW (ref 3.5–5.0)
Anion gap: 11 (ref 5–15)
Anion gap: 12 (ref 5–15)
BUN: 102 mg/dL — ABNORMAL HIGH (ref 8–23)
BUN: 102 mg/dL — ABNORMAL HIGH (ref 8–23)
CO2: 17 mmol/L — ABNORMAL LOW (ref 22–32)
CO2: 19 mmol/L — ABNORMAL LOW (ref 22–32)
Calcium: 8.9 mg/dL (ref 8.9–10.3)
Calcium: 8.8 mg/dL — ABNORMAL LOW (ref 8.9–10.3)
Chloride: 100 mmol/L (ref 98–111)
Chloride: 100 mmol/L (ref 98–111)
Creatinine, Ser: 4.07 mg/dL — ABNORMAL HIGH (ref 0.44–1.00)
Creatinine, Ser: 3.88 mg/dL — ABNORMAL HIGH (ref 0.44–1.00)
GFR, Estimated: 11 mL/min — ABNORMAL LOW (ref >60)
GFR, Estimated: 12 mL/min — ABNORMAL LOW (ref >60)
Glucose, Bld: 90 mg/dL (ref 70–99)
Glucose, Bld: 87 mg/dL (ref 70–99)
Phosphorus: 7.9 mg/dL — ABNORMAL HIGH (ref 2.5–4.6)
Phosphorus: 7.9 mg/dL — ABNORMAL HIGH (ref 2.5–4.6)
Potassium: 6.1 mmol/L — ABNORMAL HIGH (ref 3.5–5.1)
Potassium: 5.2 mmol/L — ABNORMAL HIGH (ref 3.5–5.1)
Sodium: 128 mmol/L — ABNORMAL LOW (ref 135–145)
Sodium: 131 mmol/L — ABNORMAL LOW (ref 135–145)

## 2023-10-19 LAB — I-STAT VENOUS BLOOD GAS, ED
Acid-base deficit: 10 mmol/L — ABNORMAL HIGH (ref 0.0–2.0)
Acid-base deficit: 9 mmol/L — ABNORMAL HIGH (ref 0.0–2.0)
Bicarbonate: 18.8 mmol/L — ABNORMAL LOW (ref 20.0–28.0)
Bicarbonate: 19.4 mmol/L — ABNORMAL LOW (ref 20.0–28.0)
Calcium, Ion: 1.12 mmol/L — ABNORMAL LOW (ref 1.15–1.40)
Calcium, Ion: 1.14 mmol/L — ABNORMAL LOW (ref 1.15–1.40)
HCT: 33 % — ABNORMAL LOW (ref 36.0–46.0)
HCT: 33 % — ABNORMAL LOW (ref 36.0–46.0)
Hemoglobin: 11.2 g/dL — ABNORMAL LOW (ref 12.0–15.0)
Hemoglobin: 11.2 g/dL — ABNORMAL LOW (ref 12.0–15.0)
O2 Saturation: 42 %
O2 Saturation: 72 %
Potassium: 5.7 mmol/L — ABNORMAL HIGH (ref 3.5–5.1)
Potassium: 6.5 mmol/L (ref 3.5–5.1)
Sodium: 129 mmol/L — ABNORMAL LOW (ref 135–145)
Sodium: 130 mmol/L — ABNORMAL LOW (ref 135–145)
TCO2: 20 mmol/L — ABNORMAL LOW (ref 22–32)
TCO2: 21 mmol/L — ABNORMAL LOW (ref 22–32)
pCO2, Ven: 51 mmHg (ref 44–60)
pCO2, Ven: 51.2 mmHg (ref 44–60)
pH, Ven: 7.173 — CL (ref 7.25–7.43)
pH, Ven: 7.188 — CL (ref 7.25–7.43)
pO2, Ven: 30 mmHg — CL (ref 32–45)
pO2, Ven: 47 mmHg — ABNORMAL HIGH (ref 32–45)

## 2023-10-19 LAB — BASIC METABOLIC PANEL WITH GFR
Anion gap: 13 (ref 5–15)
BUN: 104 mg/dL — ABNORMAL HIGH (ref 8–23)
CO2: 18 mmol/L — ABNORMAL LOW (ref 22–32)
Calcium: 9.1 mg/dL (ref 8.9–10.3)
Chloride: 98 mmol/L (ref 98–111)
Creatinine, Ser: 4.04 mg/dL — ABNORMAL HIGH (ref 0.44–1.00)
GFR, Estimated: 11 mL/min — ABNORMAL LOW (ref >60)
Glucose, Bld: 88 mg/dL (ref 70–99)
Potassium: 5.8 mmol/L — ABNORMAL HIGH (ref 3.5–5.1)
Sodium: 129 mmol/L — ABNORMAL LOW (ref 135–145)

## 2023-10-19 LAB — URINALYSIS, ROUTINE W REFLEX MICROSCOPIC
Bilirubin Urine: NEGATIVE
Glucose, UA: NEGATIVE mg/dL
Hgb urine dipstick: NEGATIVE
Ketones, ur: NEGATIVE mg/dL
Leukocytes,Ua: NEGATIVE
Nitrite: NEGATIVE
Protein, ur: 30 mg/dL — AB
Specific Gravity, Urine: 1.017 (ref 1.005–1.030)
pH: 5 (ref 5.0–8.0)

## 2023-10-19 LAB — RAPID URINE DRUG SCREEN, HOSP PERFORMED
Amphetamines: NOT DETECTED
Barbiturates: NOT DETECTED
Benzodiazepines: NOT DETECTED
Cocaine: NOT DETECTED
Opiates: POSITIVE — AB
Tetrahydrocannabinol: NOT DETECTED

## 2023-10-19 LAB — GLUCOSE, CAPILLARY
Glucose-Capillary: 34 mg/dL — CL (ref 70–99)
Glucose-Capillary: 39 mg/dL — CL (ref 70–99)
Glucose-Capillary: 89 mg/dL (ref 70–99)
Glucose-Capillary: 107 mg/dL — ABNORMAL HIGH (ref 70–99)
Glucose-Capillary: 80 mg/dL (ref 70–99)
Glucose-Capillary: 55 mg/dL — ABNORMAL LOW (ref 70–99)
Glucose-Capillary: 95 mg/dL (ref 70–99)
Glucose-Capillary: 105 mg/dL — ABNORMAL HIGH (ref 70–99)

## 2023-10-19 LAB — CBC
HCT: 36.7 % (ref 36.0–46.0)
HCT: 38.3 % (ref 36.0–46.0)
Hemoglobin: 10.3 g/dL — ABNORMAL LOW (ref 12.0–15.0)
Hemoglobin: 10.7 g/dL — ABNORMAL LOW (ref 12.0–15.0)
MCH: 26.7 pg (ref 26.0–34.0)
MCH: 27 pg (ref 26.0–34.0)
MCHC: 26.9 g/dL — ABNORMAL LOW (ref 30.0–36.0)
MCHC: 29.2 g/dL — ABNORMAL LOW (ref 30.0–36.0)
MCV: 92.4 fL (ref 80.0–100.0)
MCV: 99.2 fL (ref 80.0–100.0)
Platelets: 417 10*3/uL — ABNORMAL HIGH (ref 150–400)
Platelets: 443 10*3/uL — ABNORMAL HIGH (ref 150–400)
RBC: 3.86 MIL/uL — ABNORMAL LOW (ref 3.87–5.11)
RBC: 3.97 MIL/uL (ref 3.87–5.11)
RDW: 20.4 % — ABNORMAL HIGH (ref 11.5–15.5)
RDW: 20.8 % — ABNORMAL HIGH (ref 11.5–15.5)
WBC: 17.5 10*3/uL — ABNORMAL HIGH (ref 4.0–10.5)
WBC: 19.7 10*3/uL — ABNORMAL HIGH (ref 4.0–10.5)
nRBC: 0 % (ref 0.0–0.2)
nRBC: 0.2 % (ref 0.0–0.2)

## 2023-10-19 LAB — RESPIRATORY PANEL BY PCR

## 2023-10-19 LAB — HEPATIC FUNCTION PANEL
ALT: 20 U/L (ref 0–44)
AST: 18 U/L (ref 15–41)
Albumin: 3.5 g/dL (ref 3.5–5.0)
Alkaline Phosphatase: 108 U/L (ref 38–126)
Bilirubin, Direct: 0.3 mg/dL — ABNORMAL HIGH (ref 0.0–0.2)
Indirect Bilirubin: 0.5 mg/dL (ref 0.3–0.9)
Total Bilirubin: 0.8 mg/dL (ref 0.0–1.2)
Total Protein: 6.9 g/dL (ref 6.5–8.1)

## 2023-10-19 LAB — CBG MONITORING, ED
Glucose-Capillary: 61 mg/dL — ABNORMAL LOW (ref 70–99)
Glucose-Capillary: 213 mg/dL — ABNORMAL HIGH (ref 70–99)

## 2023-10-19 LAB — LACTIC ACID, PLASMA
Lactic Acid, Venous: 0.9 mmol/L (ref 0.5–1.9)
Lactic Acid, Venous: 1 mmol/L (ref 0.5–1.9)

## 2023-10-19 LAB — PROTIME-INR
INR: 3.3 — ABNORMAL HIGH (ref 0.8–1.2)
Prothrombin Time: 33.4 s — ABNORMAL HIGH (ref 11.4–15.2)

## 2023-10-19 LAB — BRAIN NATRIURETIC PEPTIDE: B Natriuretic Peptide: 911 pg/mL — ABNORMAL HIGH (ref 0.0–100.0)

## 2023-10-19 LAB — I-STAT CHEM 8, ED
BUN: 111 mg/dL — ABNORMAL HIGH (ref 8–23)
Calcium, Ion: 0.93 mmol/L — ABNORMAL LOW (ref 1.15–1.40)
Chloride: 103 mmol/L (ref 98–111)
Creatinine, Ser: 4.1 mg/dL — ABNORMAL HIGH (ref 0.44–1.00)
Glucose, Bld: 86 mg/dL (ref 70–99)
HCT: 38 % (ref 36.0–46.0)
Hemoglobin: 12.9 g/dL (ref 12.0–15.0)
Potassium: 5.6 mmol/L — ABNORMAL HIGH (ref 3.5–5.1)
Sodium: 126 mmol/L — ABNORMAL LOW (ref 135–145)
TCO2: 16 mmol/L — ABNORMAL LOW (ref 22–32)

## 2023-10-19 LAB — CK: Total CK: 78 U/L (ref 38–234)

## 2023-10-19 LAB — TROPONIN I (HIGH SENSITIVITY)
Troponin I (High Sensitivity): 18 ng/L — ABNORMAL HIGH (ref <18)
Troponin I (High Sensitivity): 17 ng/L (ref <18)

## 2023-10-19 LAB — MRSA NEXT GEN BY PCR, NASAL: MRSA by PCR Next Gen: NOT DETECTED

## 2023-10-19 LAB — RESP PANEL BY RT-PCR (RSV, FLU A&B, COVID)  RVPGX2
Influenza A by PCR: NEGATIVE
Influenza B by PCR: NEGATIVE
Resp Syncytial Virus by PCR: NEGATIVE
SARS Coronavirus 2 by RT PCR: NEGATIVE

## 2023-10-19 LAB — MAGNESIUM: Magnesium: 2.8 mg/dL — ABNORMAL HIGH (ref 1.7–2.4)

## 2023-10-19 LAB — HEPATITIS B SURFACE ANTIGEN: Hepatitis B Surface Ag: NONREACTIVE

## 2023-10-19 LAB — TSH: TSH: 2.622 u[IU]/mL (ref 0.350–4.500)

## 2023-10-19 MED ORDER — VANCOMYCIN HCL IN DEXTROSE 1-5 GM/200ML-% IV SOLN
1000.0000 mg | INTRAVENOUS | Status: DC
  Administered 2023-10-20: 1000 mg via INTRAVENOUS
  Filled 2023-10-19: qty 200

## 2023-10-19 MED ORDER — PRISMASOL BGK 2/3.5 32-2-3.5 MEQ/L EC SOLN: Status: DC

## 2023-10-19 MED ORDER — WARFARIN - PHARMACIST DOSING INPATIENT: Freq: Every day | Status: DC

## 2023-10-19 MED ORDER — CALCIUM GLUCONATE-NACL 1-0.675 GM/50ML-% IV SOLN
1.0000 g | Freq: Once | INTRAVENOUS | Status: AC
  Administered 2023-10-19: 1000 mg via INTRAVENOUS
  Filled 2023-10-19: qty 50

## 2023-10-19 MED ORDER — SODIUM CHLORIDE 0.9 % IV SOLN: INTRAVENOUS | Status: DC

## 2023-10-19 MED ORDER — DEXMEDETOMIDINE HCL IN NACL 400 MCG/100ML IV SOLN
0.0000 ug/kg/h | INTRAVENOUS | Status: DC
  Administered 2023-10-19 (×2): 0.4 ug/kg/h via INTRAVENOUS
  Administered 2023-10-20: 1.2 ug/kg/h via INTRAVENOUS
  Administered 2023-10-20: 0.9 ug/kg/h via INTRAVENOUS
  Administered 2023-10-20: 0.4 ug/kg/h via INTRAVENOUS
  Administered 2023-10-21: 0.9 ug/kg/h via INTRAVENOUS
  Administered 2023-10-21: 1 ug/kg/h via INTRAVENOUS
  Administered 2023-10-21: 1.1 ug/kg/h via INTRAVENOUS
  Administered 2023-10-21: 1.2 ug/kg/h via INTRAVENOUS
  Administered 2023-10-22: 0.5 ug/kg/h via INTRAVENOUS
  Administered 2023-10-22: 1.2 ug/kg/h via INTRAVENOUS
  Filled 2023-10-19 (×10): qty 100

## 2023-10-19 MED ORDER — WARFARIN SODIUM 5 MG PO TABS
5.0000 mg | ORAL_TABLET | Freq: Every day | ORAL | Status: DC
Start: 2023-10-19 — End: 2023-10-19

## 2023-10-19 MED ORDER — ACETAMINOPHEN 650 MG RE SUPP: 650.0000 mg | Freq: Four times a day (QID) | RECTAL | Status: DC | PRN

## 2023-10-19 MED ORDER — DEXTROSE 50 % IV SOLN
1.0000 | Freq: Once | INTRAVENOUS | Status: AC
  Administered 2023-10-19: 50 mL via INTRAVENOUS
  Filled 2023-10-19: qty 50

## 2023-10-19 MED ORDER — DEXTROSE 50 % IV SOLN
INTRAVENOUS | Status: AC
  Administered 2023-10-19: 50 mL
  Filled 2023-10-19: qty 50

## 2023-10-19 MED ORDER — ORAL CARE MOUTH RINSE
15.0000 mL | OROMUCOSAL | Status: DC
  Administered 2023-10-19 – 2023-10-25 (×20): 15 mL via OROMUCOSAL

## 2023-10-19 MED ORDER — SODIUM CHLORIDE 0.9 % IV BOLUS
500.0000 mL | Freq: Once | INTRAVENOUS | Status: AC
  Administered 2023-10-19: 500 mL via INTRAVENOUS

## 2023-10-19 MED ORDER — HEPARIN SODIUM (PORCINE) 1000 UNIT/ML DIALYSIS
1000.0000 [IU] | INTRAMUSCULAR | Status: DC | PRN
  Administered 2023-10-21: 3000 [IU] via INTRAVENOUS_CENTRAL
  Filled 2023-10-19: qty 6
  Filled 2023-10-19: qty 4

## 2023-10-19 MED ORDER — PIPERACILLIN-TAZOBACTAM IN DEX 2-0.25 GM/50ML IV SOLN
2.2500 g | Freq: Three times a day (TID) | INTRAVENOUS | Status: DC
  Administered 2023-10-19: 2.25 g via INTRAVENOUS
  Filled 2023-10-19 (×3): qty 50

## 2023-10-19 MED ORDER — SODIUM ZIRCONIUM CYCLOSILICATE 10 G PO PACK
10.0000 g | PACK | Freq: Once | ORAL | Status: AC
  Administered 2023-10-19: 10 g via ORAL
  Filled 2023-10-19: qty 1

## 2023-10-19 MED ORDER — SODIUM CHLORIDE 0.9 % IV SOLN: INTRAVENOUS | Status: AC | PRN

## 2023-10-19 MED ORDER — ORAL CARE MOUTH RINSE: 15.0000 mL | OROMUCOSAL | Status: DC | PRN

## 2023-10-19 MED ORDER — ACETAMINOPHEN 325 MG PO TABS
650.0000 mg | ORAL_TABLET | Freq: Four times a day (QID) | ORAL | Status: DC | PRN
  Administered 2023-10-22 – 2023-10-25 (×5): 650 mg via ORAL
  Filled 2023-10-19 (×6): qty 2

## 2023-10-19 MED ORDER — SODIUM BICARBONATE 8.4 % IV SOLN
50.0000 meq | Freq: Once | INTRAVENOUS | Status: AC
  Administered 2023-10-19: 50 meq via INTRAVENOUS
  Filled 2023-10-19: qty 50

## 2023-10-19 MED ORDER — BUDESON-GLYCOPYRROL-FORMOTEROL 160-9-4.8 MCG/ACT IN AERO
2.0000 | INHALATION_SPRAY | Freq: Two times a day (BID) | RESPIRATORY_TRACT | Status: DC
  Administered 2023-10-19 – 2023-10-20 (×2): 2 via RESPIRATORY_TRACT
  Filled 2023-10-19 (×2): qty 5.9

## 2023-10-19 MED ORDER — PIPERACILLIN-TAZOBACTAM 3.375 G IVPB 30 MIN
3.3750 g | Freq: Four times a day (QID) | INTRAVENOUS | Status: DC
  Administered 2023-10-19 – 2023-10-22 (×12): 3.375 g via INTRAVENOUS
  Filled 2023-10-19 (×12): qty 50

## 2023-10-19 MED ORDER — INSULIN ASPART 100 UNIT/ML IV SOLN
5.0000 [IU] | Freq: Once | INTRAVENOUS | Status: AC
  Administered 2023-10-19: 5 [IU] via INTRAVENOUS

## 2023-10-19 MED ORDER — ALBUTEROL SULFATE (2.5 MG/3ML) 0.083% IN NEBU
2.5000 mg | INHALATION_SOLUTION | RESPIRATORY_TRACT | Status: DC | PRN
  Administered 2023-10-20 – 2023-10-22 (×2): 2.5 mg via RESPIRATORY_TRACT
  Filled 2023-10-19 (×2): qty 3

## 2023-10-19 MED ORDER — SENNOSIDES-DOCUSATE SODIUM 8.6-50 MG PO TABS
1.0000 | ORAL_TABLET | Freq: Every evening | ORAL | Status: DC | PRN
  Administered 2023-10-25: 1 via ORAL
  Filled 2023-10-19: qty 1

## 2023-10-19 MED ORDER — CHLORHEXIDINE GLUCONATE CLOTH 2 % EX PADS
6.0000 | MEDICATED_PAD | Freq: Every day | CUTANEOUS | Status: DC
  Administered 2023-10-19 – 2023-10-25 (×7): 6 via TOPICAL

## 2023-10-19 MED ORDER — SODIUM ZIRCONIUM CYCLOSILICATE 10 G PO PACK: 10.0000 g | PACK | Freq: Two times a day (BID) | ORAL | Status: DC

## 2023-10-19 MED ORDER — VANCOMYCIN HCL 1500 MG/300ML IV SOLN
1500.0000 mg | Freq: Once | INTRAVENOUS | Status: AC
Start: 2023-10-19 — End: 2023-10-19
  Administered 2023-10-19: 1500 mg via INTRAVENOUS
  Filled 2023-10-19: qty 300

## 2023-10-19 MED ORDER — SODIUM CHLORIDE 0.9 % FOR CRRT: INTRAVENOUS_CENTRAL | Status: DC | PRN

## 2023-10-19 MED ORDER — WARFARIN SODIUM 5 MG PO TABS: 5.0000 mg | ORAL_TABLET | Freq: Once | ORAL | Status: DC

## 2023-10-19 MED ORDER — LORAZEPAM 2 MG/ML IJ SOLN
0.5000 mg | Freq: Once | INTRAMUSCULAR | Status: AC
  Administered 2023-10-19: 0.5 mg via INTRAVENOUS
  Filled 2023-10-19: qty 1

## 2023-10-19 MED ORDER — DEXTROSE 50 % IV SOLN
1.0000 | Freq: Once | INTRAVENOUS | Status: AC
  Administered 2023-10-19: 50 mL via INTRAVENOUS

## 2023-10-19 MED ORDER — VANCOMYCIN VARIABLE DOSE PER UNSTABLE RENAL FUNCTION (PHARMACIST DOSING)
Status: DC
Start: 2023-10-19 — End: 2023-10-19

## 2023-10-19 MED ORDER — NOREPINEPHRINE 4 MG/250ML-% IV SOLN
0.0000 ug/min | INTRAVENOUS | Status: DC
  Administered 2023-10-19: 2 ug/min via INTRAVENOUS
  Administered 2023-10-20: 6 ug/min via INTRAVENOUS
  Filled 2023-10-19 (×2): qty 250

## 2023-10-19 NOTE — H&P (Addendum)
 Date: 10/19/2023               Patient Name:  Kim Anthony [name]: [id number]  DOB: [birth year] Age / Sex: 73 y.o., female   PCP: [name], MD         Medical Service: Internal Medicine Teaching Service         Attending Physician: Dr. Kirt Pereyra, MD      First Contact: Dr. Jose Ngo, MD    Second Contact: Dr. Lorelle Roll, DO         After Hours (After 5p/  First Contact Pager: [phone]  weekends / holidays): Second Contact Pager: [phone]   SUBJECTIVE   Chief Complaint: shortness of breath  History of Present Illness:  Kim Anthony is a 73 year old female with a past medical history of atrial fibrillation, mitral valve replacement on Coumadin , COPD, right-sided heart failure, CKD 3B, gout, and history of breast cancer who presented to the emergency department with shortness of breath.  History is severely limited by patient's somnolence and altered mental status. She is unable to answer our questions at the time of exam. She is oriented to self, but seems not oriented to place or time.   Of note, she was recently admitted from 4/9 to 4/18 with a heart failure exacerbation.  She was seen in our clinic on 4/23 and at that time was doing okay. Per ED nursing note, she arrives with her daughter due to shortness of breath and neck pain.  She usually uses oxygen  at home but did not charge her machine today, so she ran out of oxygen  and complained of shortness of breath.   ED Course: Mildly hypotensive, afebrile, satting well on room air Hyponatremia 129 Potassium 5.8 Bicarb 18 BUN 104, creatinine 4.04 Troponin 18 PT 33.4, INR 3.3 BNP 911 Received calcium  gluconate, 500cc bolus NS, 0.5 mg ativan   Meds: unable to confirm due to patient mental status albuterol  PRN Farxiga  10 mg daily  Flonase   Trelegy daily  gabapentin  800 mg 3 times daily losartan  12.5 mg daily metoprolol  25 mg twice daily Norco 5-325 mg every 4 as needed potassium 20 mEq simvastatin  20  mg spironolactone  12.5 mg torsemide  80 mg daily warfarin 5 mg daily   Past Medical History: Past Medical History:  Diagnosis Date   Acute delirium 08/30/2023   Acute GI bleeding 08/18/2023   Arthritis    Asthma    Breast cancer (HCC) 2001   Left Breast Cancer   CHF (congestive heart failure) (HCC)    Closed fracture of right hip (HCC) 09/23/2022   Closed intertrochanteric fracture of right femur, initial encounter (HCC) 09/22/2022   COPD (chronic obstructive pulmonary disease) (HCC)    Coronary artery disease    GERD (gastroesophageal reflux disease)    History of mitral valve prosthesis 07/31/2023   History of mitral valve replacement with mechanical valve    HLD (hyperlipidemia)    Hypertension    Personal history of breast cancer    Left Breast Cancer     Pulmonary embolism (HCC) 2021   Past Surgical History: Past Surgical History:  Procedure Laterality Date   APPLICATION OF A-CELL OF CHEST/ABDOMEN Left 01/27/2019   Procedure: APPLICATION OF A-CELL OF CHEST;  Surgeon: Thornell Flirt, DO;  Location: [facility] OR;  Service: Plastics;  Laterality: Left;   APPLICATION OF WOUND VAC N/A 01/27/2019   Procedure: APPLICATION OF WOUND VAC;  Surgeon: Thornell Flirt, DO;  Location: [facility] OR;  Service: Plastics;  Laterality: N/A;  BREAST SURGERY Left 2001   for CA   CARDIOVERSION N/A 02/26/2022   Procedure: CARDIOVERSION;  Surgeon: Wendie Hamburg, MD;  Location: [facility] ENDOSCOPY;  Service: Cardiovascular;  Laterality: N/A;   COLONOSCOPY WITH PROPOFOL  N/A 06/07/2018   Procedure: COLONOSCOPY WITH PROPOFOL ;  Surgeon: Baldo Bonds, MD;  Location: [facility] ENDOSCOPY;  Service: Endoscopy;  Laterality: N/A;   DEBRIDEMENT AND CLOSURE WOUND Left 12/28/2018   Procedure: Debridement And Closure Wound;  Surgeon: Oza Blumenthal, MD;  Location: [facility] OR;  Service: General;  Laterality: Left;   ESOPHAGOGASTRODUODENOSCOPY (EGD) WITH PROPOFOL  N/A 08/24/2023   Procedure:  ESOPHAGOGASTRODUODENOSCOPY (EGD) WITH PROPOFOL ;  Surgeon: Renaye Carp, DO;  Location: [facility] ENDOSCOPY;  Service: Gastroenterology;  Laterality: N/A;   INCISION AND DRAINAGE OF WOUND Left 01/27/2019   Procedure: IRRIGATION AND DEBRIDEMENT OF CHEST WALL;  Surgeon: Thornell Flirt, DO;  Location: [facility] OR;  Service: Plastics;  Laterality: Left;   INTRAMEDULLARY (IM) NAIL INTERTROCHANTERIC N/A 09/23/2022   Procedure: INTRAMEDULLARY (IM) NAIL INTERTROCHANTERIC;  Surgeon: Wes Hamman, MD;  Location: [facility] OR;  Service: Orthopedics;  Laterality: N/A;   IR FLUORO GUIDE CV LINE RIGHT  10/01/2023   IR US  GUIDE VASC ACCESS RIGHT  10/01/2023   IR VENO/EXT/UNI RIGHT  10/05/2023   KNEE ARTHROSCOPY WITH MEDIAL MENISECTOMY Left 08/06/2021   Procedure: KNEE ARTHROSCOPY WITH PARTIAL MEDIAL MENISECTOMY, DEBRIDEMENT;  Surgeon: Orvan Blanch, MD;  Location: [facility] ORS;  Service: Orthopedics;  Laterality: Left;  1 HR   LATISSIMUS FLAP TO BREAST Left 01/18/2019   Procedure: LEFT LATISSIMUS FLAP TO BREAST;  Surgeon: Thornell Flirt, DO;  Location: [facility] OR;  Service: Plastics;  Laterality: Left;   MASTECTOMY Left    MITRAL VALVE REPLACEMENT     mechanical   POLYPECTOMY  06/07/2018   Procedure: POLYPECTOMY;  Surgeon: Baldo Bonds, MD;  Location: [facility] ENDOSCOPY;  Service: Endoscopy;;   RIGHT HEART CATH N/A 08/20/2023   Procedure: RIGHT HEART CATH;  Surgeon: Mardell Shade, MD;  Location: [facility] INVASIVE CV LAB;  Service: Cardiovascular;  Laterality: N/A;   RIGHT/LEFT HEART CATH AND CORONARY ANGIOGRAPHY N/A 01/09/2021   Procedure: RIGHT/LEFT HEART CATH AND CORONARY ANGIOGRAPHY;  Surgeon: Mardell Shade, MD;  Location: [facility] INVASIVE CV LAB;  Service: Cardiovascular;  Laterality: N/A;   WOUND DEBRIDEMENT Left 12/28/2018   Procedure: EXCISION OF LEFT CHRONIC CHEST WALL WOUND;  Surgeon: Oza Blumenthal, MD;  Location: [facility] OR;  Service: General;  Laterality: Left;   Social:  Lives With: daughter Occupation:  retired, used to work at [employer] Level of Function: Independent ADLs, mobile with a cane or walker at home PCP: [name], MD Substances: Smokes tobacco, 5 or 6 cigarettes a day, over 30 pack years.  No alcohol or other drug use.  Family History:  Family History  Problem Relation Age of Onset   Hypertension Mother    Cancer Father    Hypertension Father    Breast cancer Maternal Aunt 50   Heart attack Other    Allergies: Allergies as of 10/18/2023 - Review Complete 10/18/2023  Allergen Reaction Noted   Lisinopril  Swelling 01/07/2021   Acetaminophen -codeine Itching 07/10/2015   Propoxyphene Itching 01/18/2019   Tape Rash 03/09/2019   Tramadol  Itching, Nausea And Vomiting, and Rash 07/10/2015   Review of Systems: A complete ROS was negative except as per HPI.   OBJECTIVE:   Physical Exam: Blood pressure (!) 117/96, pulse 62, temperature 97.6 F (36.4 C), temperature source Oral, resp. rate 20, weight 68 kg, SpO2  96%.  Constitutional: ill-appearing, laying in bed somnolent HENT: normocephalic atraumatic, mucous membranes dry Eyes: conjunctiva non-erythematous Neck: supple Cardiovascular: bradycardia, sinus rhythm, no m/r/g; no JVD or LE edema appreciated; extremities cool to touch Pulmonary/Chest: normal work of breathing on room air, mild crackles in lung bases, exam limited by patient participation Abdominal: soft, non-tender, mild distension, mild hepatomegaly MSK: normal bulk and tone Neurological: alert & oriented x 1, no facial droop or apparent cranial nerve deficits, able to move all extremities with commands Skin: no rashes, wounds or lesion appreciated  Labs: CBC    Component Value Date/Time   WBC 19.7 (H) 10/18/2023 2342   RBC 3.86 (L) 10/18/2023 2342   HGB 12.9 10/19/2023 0122   HGB 12.8 01/13/2022 1038   HCT 38.0 10/19/2023 0122   HCT 38.8 01/13/2022 1038   PLT 417 (H) 10/18/2023 2342   PLT 427 01/13/2022 1038   MCV 99.2 10/18/2023 2342    MCV 96 01/13/2022 1038   MCH 26.7 10/18/2023 2342   MCHC 26.9 (L) 10/18/2023 2342   RDW 20.8 (H) 10/18/2023 2342   RDW 11.4 (L) 01/13/2022 1038   LYMPHSABS 1.9 08/23/2023 0454   LYMPHSABS 3.0 04/21/2018 1624   MONOABS 2.7 (H) 08/23/2023 0454   EOSABS 0.4 08/23/2023 0454   EOSABS 0.2 04/21/2018 1624   BASOSABS 0.1 08/23/2023 0454   BASOSABS 0.1 04/21/2018 1624     CMP     Component Value Date/Time   NA 126 (L) 10/19/2023 0122   NA 134 10/13/2023 1549   K 5.6 (H) 10/19/2023 0122   CL 103 10/19/2023 0122   CO2 18 (L) 10/19/2023 0115   GLUCOSE 86 10/19/2023 0122   BUN 111 (H) 10/19/2023 0122   BUN 70 (H) 10/13/2023 1549   CREATININE 4.10 (H) 10/19/2023 0122   CALCIUM  9.1 10/19/2023 0115   PROT 7.4 10/06/2023 0500   PROT 7.9 01/27/2021 1015   ALBUMIN 3.3 (L) 10/06/2023 0500   ALBUMIN 4.6 01/27/2021 1015   AST 36 10/06/2023 0500   ALT 24 10/06/2023 0500   ALKPHOS 129 (H) 10/06/2023 0500   BILITOT 0.7 10/06/2023 0500   BILITOT 0.5 01/27/2021 1015   GFRNONAA 11 (L) 10/19/2023 0115   GFRAA 104 02/28/2020 1159    Imaging: DG Chest 2 View CLINICAL DATA:  Shortness of breath and neck pain. Hypoxia on arrival.  EXAM: CHEST - 2 VIEW  COMPARISON:  AP Lat chest series 09/29/23, 09/19/23.  FINDINGS: Moderate to severe cardiomegaly again noted with MVR. Intact sternotomy sutures.  There are no findings of acute CHF. There is chronic pleural-parenchymal disease left apex, left mid field.  No new infiltrate is seen. There is no substantial pleural effusion.  The mediastinum is normally outlined.  Left axillary surgical clips.  There is thoracic kyphosis and degenerative change with no new osseous findings.  IMPRESSION: 1. No evidence of acute chest disease. 2. Cardiomegaly with chronic pleural-parenchymal disease left apex, left mid field.  Electronically Signed   By: Denman Fischer M.D.   On: 10/18/2023 22:17  EKG: personally reviewed my interpretation is sinus  bradycardia, no new ischemic changes  ASSESSMENT & PLAN:  Assessment & Plan by Problem: Active Problems:   Altered Mental Status  RESSA TAPANI is a 73 y.o. person living with a history of atrial fibrillation, mitral valve replacement on Coumadin , COPD, right-sided heart failure, and CKD 3B who presented with shortness of breath and neck pain and is admitted for altered mental status and acute kidney injury on  hospital day 0.  Altered mental status She is A&O x 1, a deviation from her baseline.  There are several possible etiologies for her altered mental status including possible sepsis, uremia, or toxin-induced altered mental status.  She has a leukocytosis and bilateral parotid gland enlargement and tenderness (parotid U/S on 4/11 negative), concerning for sialadenitis. She has borderline blood pressures.  She is uremic with a BUN of 104 and newly elevated creatinine, which may also be contributing.  She also was without her baseline oxygen  at home, so cannot rule out hypercarbia. She received 0.5 mg of ativan  in the ED, which may also be contributing. We will start a broad workup and treatment and narrow as able.  - Blood cultures - Vancomycin /zosyn  - ABG - TSH, Magnesium  - Urinalysis, UDS - Lactic acid - Hepatic function panel - Respiratory panel - Cardiac monitoring - On 3L [place], satting at goal (88-92%)  Acute kidney injury CKD, stage 3b Creatinine acutely elevated to 4.04 from 1.85 one week ago.  BUN 104 from 70.  Unclear etiology, potentially due to sepsis or cardiorenal.  Will hold nephrotoxic medications. - BMP in am - If altered mental status does not improve, may need nephrology consult for dialysis  Combined systolic and diastolic congestive heart failure Mild to moderate pulmonary hypertension with cor pulmonale Mostly euvolemic on exam. May have some abdominal distension. No LE edema or JVD appreciated. BNP elevated at 911.  Previously admitted for exacerbations in the  setting of poor medication adherence and dietary indiscretion.  Last echocardiogram in March 2025 showed an ejection fraction of 55-60% with severely reduced right ventricular function and right-sided volume overload.  Right heart cath around same time showed pulmonary arterial hypertension with cor pulmonale.  Medication regimen includes Farxiga  10 mg daily, metoprolol  25 mg twice daily, losartan  12.5 mg daily, and torsemide  80 mg daily, but it is unclear if she is taking these.  - Consider diuresis with IV Lasix  if sepsis workup negative  Hyperkalemia Potassium elevated at 5.8 in the setting of acute kidney injury.  Received calcium  gluconate and calcium  gluconate in the ED.  She takes 20 mEq of potassium every day.  EKG without electrolyte changes. - BMP in am  Hyponatremia Sodium 129 on presentation, in the setting of acute kidney injury. Will continue to monitor.  Consider serum/urine studies if persistent. - BMP in am  Atrial fibrillation, on warfarin Mechanical mitral valve PTT 33.4, INR 3.3.  Currently on 5 mg of warfarin daily, follows in the [facility] clinic with Dr. Margit Shelling.  - Warfarin per pharmacy  Gout No acute issues.  Recently restarted on allopurinol  50 mg every other day outpatient.  Holding this in the setting of her AKI.  Hypertension Borderline hypotensive on presentation.  Holding antihypertensive medications.  Hyperlipidemia On simvastatin  20 mg daily, held.   COPD No concern for exacerbation. On albuterol , breztri . Continued these.   Diet: Heart Healthy VTE: warfarin IVF: None Code: Full  Prior to Admission Living Arrangement: Home, living with daughter Anticipated Discharge Location: Home Barriers to Discharge: medical workup and treatment  Dispo: Admit patient to Inpatient with expected length of stay greater than 2 midnights.  Signed: [name], MD Internal Medicine Resident, PGY-1 [facility]   10/19/2023, 1:56 AM

## 2023-10-19 NOTE — ED Provider Notes (Signed)
 [place] [facility] Provider Note   CSN: [id number] Arrival date & time: 10/18/23  2113     History  Chief Complaint  Patient presents with   Shortness of Breath   Neck Pain    TEKAYLA WICKSON is a 73 y.o. female.  Patient with history of CHF, COPD, CAD, hyperlipidemia, hypertension, DVT/PE on warfarin presents today with complaints of shortness of breath, neck pain. She states that same began 2 days ago and has been persistent since. She states that she was just discharged from the hospital for CHF exacerbation. She was discharged with home oxygen , however she has not required oxygen  until yesterday. She is unsure what her weights have been running. Notes that her legs and abdomen do not feel swollen. Denies cough. Also notes that the left side of her neck has been hurting since she woke up 3 days ago. Does note that she sleeps with her neck to the left, but this is not new for her. She has been taking vicodin for her chronic back pain for several years and states this is not helping her neck pain. Pain is present with movement, relieved with rest. Denies trouble swallowing or swelling.   The history is provided by the patient. No language interpreter was used.  Shortness of Breath Associated symptoms: neck pain   Neck Pain      Home Medications Prior to Admission medications   Medication Sig Start Date End Date Taking? Authorizing Provider  albuterol  (VENTOLIN  HFA) 108 (90 Base) MCG/ACT inhaler Inhale 1 puff into the lungs every 4 (four) hours as needed for wheezing or shortness of breath. 09/14/23   Carleen Chary, DO  allopurinol  (ZYLOPRIM ) 100 MG tablet Take 0.5 tablets (50 mg total) by mouth every other day. 10/13/23   Malen Scudder, DO  colchicine  0.6 MG tablet Take 0.5 tablets (0.3 mg total) by mouth daily. 10/08/23   Cleven Dallas, DO  FARXIGA  10 MG TABS tablet Take 1 tablet by mouth once daily 08/31/23   Amoako, Prince, MD  fluticasone   (FLONASE ) 50 MCG/ACT nasal spray Place 2 sprays into both nostrils daily. Patient taking differently: Place 1 spray into both nostrils daily. 05/17/23 05/16/24  Amoako, Prince, MD  Fluticasone -Umeclidin-Vilant (TRELEGY ELLIPTA ) 100-62.5-25 MCG/ACT AEPB Inhale 1 puff into the lungs daily. 09/14/23   Carleen Chary, DO  gabapentin  (NEURONTIN ) 100 MG capsule Take 2 capsules (200 mg total) by mouth 3 (three) times daily. 10/08/23   Cleven Dallas, DO  HYDROcodone -acetaminophen  (NORCO/VICODIN) 5-325 MG tablet Take 1 tablet by mouth every 4 (four) hours as needed for moderate pain (pain score 4-6). Diagnosis- chronic low back pain 09/17/23   Lovorn, Megan, MD  hydrOXYzine  (ATARAX ) 10 MG tablet TAKE 1 TABLET BY MOUTH THREE TIMES DAILY AS NEEDED FOR ANXIETY 10/18/23   Amoako, Prince, MD  losartan  (COZAAR ) 25 MG tablet Take 0.5 tablets (12.5 mg total) by mouth daily. 08/10/23 11/08/23  Elmarie Hacking, FNP  metoprolol  succinate (TOPROL -XL) 25 MG 24 hr tablet Take 1 tablet (25 mg total) by mouth 2 (two) times daily. 08/01/23   Jayson Michael, MD  potassium chloride  (KLOR-CON  M) 10 MEQ tablet Take 2 tablets (20 mEq total) by mouth daily. 09/07/23 04/04/24  [name], MD  senna-docusate (SENOKOT-S) 8.6-50 MG tablet Take 1 tablet by mouth at bedtime as needed for mild constipation. 10/08/23   Cleven Dallas, DO  simvastatin  (ZOCOR ) 20 MG tablet Take 1 tablet (20 mg total) by mouth daily. 12/03/22   Hiram Lukes,  Herminia Lope, MD  torsemide  (DEMADEX ) 20 MG tablet Take 4 tablets (80 mg total) by mouth daily. 10/08/23   Cleven Dallas, DO  Vitamin D , Ergocalciferol , (DRISDOL ) 1.25 MG (50000 UNIT) CAPS capsule Take 1 capsule (50,000 Units total) by mouth every 7 (seven) days. 10/11/23   Cleven Dallas, DO  warfarin (COUMADIN ) 5 MG tablet Take 1 tablet (5 mg total) by mouth daily at 4 PM. 10/08/23   Cleven Dallas, DO      Allergies    Lisinopril , Acetaminophen -codeine, Propoxyphene, Tape, and Tramadol     Review of Systems    Review of Systems  Respiratory:  Positive for shortness of breath.   Musculoskeletal:  Positive for neck pain.  All other systems reviewed and are negative.   Physical Exam Updated Vital Signs BP (!) 117/96   Pulse 62   Temp 97.6 F (36.4 C) (Oral)   Resp 20   Wt 68 kg   SpO2 96%   BMI 24.95 kg/m  Physical Exam Vitals and nursing note reviewed.  Constitutional:      General: She is not in acute distress.    Appearance: Normal appearance. She is normal weight. She is not ill-appearing, toxic-appearing or diaphoretic.  HENT:     Head: Normocephalic and atraumatic.  Neck:     Comments: Palpable left sided neck tenderness to palpation over muscular areas. No focal bony tenderness. No midline cervical spine tenderness.  Cardiovascular:     Rate and Rhythm: Normal rate and regular rhythm.     Pulses:          Radial pulses are 2+ on the right side and 2+ on the left side.     Heart sounds: Normal heart sounds.  Pulmonary:     Effort: Pulmonary effort is normal. No respiratory distress.     Breath sounds: Normal breath sounds.  Abdominal:     General: There is no distension.     Palpations: Abdomen is soft.     Tenderness: There is no abdominal tenderness.  Musculoskeletal:        General: Normal range of motion.     Cervical back: Normal range of motion and neck supple.     Right lower leg: No tenderness. No edema.     Left lower leg: No tenderness. No edema.  Skin:    General: Skin is warm and dry.  Neurological:     General: No focal deficit present.     Mental Status: She is alert.  Psychiatric:        Mood and Affect: Mood normal.        Behavior: Behavior normal.     ED Results / Procedures / Treatments   Labs (all labs ordered are listed, but only abnormal results are displayed) Labs Reviewed  CBC - Abnormal; Notable for the following components:      Result Value   WBC 19.7 (*)    RBC 3.86 (*)    Hemoglobin 10.3 (*)    MCHC 26.9 (*)    RDW 20.8 (*)     Platelets 417 (*)    All other components within normal limits  BRAIN NATRIURETIC PEPTIDE - Abnormal; Notable for the following components:   B Natriuretic Peptide 911.0 (*)    All other components within normal limits  BASIC METABOLIC PANEL WITH GFR - Abnormal; Notable for the following components:   Sodium 129 (*)    Potassium 5.8 (*)    CO2 18 (*)    BUN 104 (*)  Creatinine, Ser 4.04 (*)    GFR, Estimated 11 (*)    All other components within normal limits  PROTIME-INR - Abnormal; Notable for the following components:   Prothrombin  Time 33.4 (*)    INR 3.3 (*)    All other components within normal limits  I-STAT CHEM 8, ED - Abnormal; Notable for the following components:   Sodium 126 (*)    Potassium 5.6 (*)    BUN 111 (*)    Creatinine, Ser 4.10 (*)    Calcium , Ion 0.93 (*)    TCO2 16 (*)    All other components within normal limits  TROPONIN I (HIGH SENSITIVITY) - Abnormal; Notable for the following components:   Troponin I (High Sensitivity) 18 (*)    All other components within normal limits  TROPONIN I (HIGH SENSITIVITY)    EKG None  Radiology DG Chest 2 View Result Date: 10/18/2023 CLINICAL DATA:  Shortness of breath and neck pain. Hypoxia on arrival. EXAM: CHEST - 2 VIEW COMPARISON:  AP Lat chest series 09/29/23, 09/19/23. FINDINGS: Moderate to severe cardiomegaly again noted with MVR. Intact sternotomy sutures. There are no findings of acute CHF. There is chronic pleural-parenchymal disease left apex, left mid field. No new infiltrate is seen. There is no substantial pleural effusion. The mediastinum is normally outlined.  Left axillary surgical clips. There is thoracic kyphosis and degenerative change with no new osseous findings. IMPRESSION: 1. No evidence of acute chest disease. 2. Cardiomegaly with chronic pleural-parenchymal disease left apex, left mid field. Electronically Signed   By: Denman Fischer M.D.   On: 10/18/2023 22:17    Procedures Procedures     Medications Ordered in ED Medications  LORazepam  (ATIVAN ) injection 0.5 mg (0.5 mg Intravenous Given 10/19/23 0142)  calcium  gluconate 1 g/ 50 mL sodium chloride  IVPB (0 mg Intravenous Stopped 10/19/23 0221)  sodium zirconium cyclosilicate (LOKELMA) packet 10 g (10 g Oral Given 10/19/23 0212)  sodium chloride  0.9 % bolus 500 mL (500 mLs Intravenous New Bag/Given 10/19/23 0220)    ED Course/ Medical Decision Making/ A&P                                 Medical Decision Making Amount and/or Complexity of Data Reviewed Labs: ordered. Radiology: ordered.  Risk Prescription drug management. Decision regarding hospitalization.   This patient is a 73 y.o. female who presents to the ED for concern of shortness of breath, neck pain, this involves an extensive number of treatment options, and is a complaint that carries with it a high risk of complications and morbidity. The emergent differential diagnosis prior to evaluation includes, but is not limited to,  CHF, pericardial effusion/tamponade, arrhythmias, ACS, COPD, asthma, bronchitis, pneumonia, pneumothorax, PE, anemia   This is not an exhaustive differential.   Past Medical History / Co-morbidities / Social History:  has a past medical history of Acute delirium (08/30/2023), Acute GI bleeding (08/18/2023), Arthritis, Asthma, Breast cancer (HCC) (2001), CHF (congestive heart failure) (HCC), Closed fracture of right hip (HCC) (09/23/2022), Closed intertrochanteric fracture of right femur, initial encounter (HCC) (09/22/2022), COPD (chronic obstructive pulmonary disease) (HCC), Coronary artery disease, GERD (gastroesophageal reflux disease), History of mitral valve prosthesis (07/31/2023), History of mitral valve replacement with mechanical valve, HLD (hyperlipidemia), Hypertension, Personal history of breast cancer, and Pulmonary embolism (HCC) (2021).  Additional history: Chart reviewed. Pertinent results include: recent admission for CHF  exacerbation  Physical Exam: Physical exam performed.  The pertinent findings include: does not appear fluid overloaded on exam  Lab Tests: I ordered, and personally interpreted labs.  The pertinent results include:  WBC 19.7 (has been on steroids for gout, likely cause of leukocytosis), hgb at baseline. BNP 911, troponin 18 --> 17. Na 129, K 5.8, creatinine 4.04 (up from 1.85 6 days ago)   Imaging Studies: I ordered imaging studies including CXR. I independently visualized and interpreted imaging which showed   1. No evidence of acute chest disease. 2. Cardiomegaly with chronic pleural-parenchymal disease left apex, left mid field.  I agree with the radiologist interpretation.   Cardiac Monitoring:  The patient was maintained on a cardiac monitor.  Cardiac monitor showed an underlying rhythm of: sinus rhythm, no STEMI. I agree with this interpretation.   Medications: I ordered medication including lokelma and calcium  gluconate for hyperkalemia, 500 cc fluids for AKI, ativan  for anxiety. Reevaluation of the patient after these medicines showed that the patient stayed the same. I have reviewed the patients home medicines and have made adjustments as needed.   Disposition: After consideration of the diagnostic results and the patients response to treatment, I feel that patient will require admission for AKI. Discussed same with patient who is understanding and in agreement with this.   Discussed patient with hospitalist Dr. Lydia Sams who accepts patient for admission.   I discussed this case with my attending physician Dr. Harless Lien who cosigned this note including patient's presenting symptoms, physical exam, and planned diagnostics and interventions. Attending physician stated agreement with plan or made changes to plan which were implemented.    Final Clinical Impression(s) / ED Diagnoses Final diagnoses:  AKI (acute kidney injury) (HCC)  Hyperkalemia  Hyponatremia    Rx / DC Orders ED  Discharge Orders     None         Fredna Jasper 10/19/23 0429    Edson Graces, MD 10/19/23 [phone]

## 2023-10-19 NOTE — ED Notes (Signed)
 Provider inform, of pt I-stat

## 2023-10-19 NOTE — Consult Note (Signed)
 NAME:  Kim Anthony, [name]:  [id number], DOB:  October 10, 1950, LOS: 0 ADMISSION DATE:  10/18/2023, CONSULTATION DATE: 10/19/2023 REFERRING MD: Internal medicine, CHIEF COMPLAINT: Worsening renal failure hypotension  History of Present Illness:  73 year old female with a history of breast cancer, COPD with continued tobacco, O2 dependent at 2 L daily who presents with worsening renal failure potassium 6.1 white count of 17 hypotension and bradycardia.  She had calcium  gluconate for her elevated potassium sodium bicarbonate.  Her baseline creatinine is 1.85 currently is 4.07 potassium is 6.1.  She currently has been fluid resuscitated for hypotension and bradycardia is not on vasopressor support at this time.  She does have elevated white count therefore she is on empirical antimicrobial therapy she does open her eyes and follow commands at this time and protect her airway therefore does not need intubation at this time.  She has been scheduled for interventional radiology to place hemodialysis catheter we will continue gentle fluid resuscitation with consideration of adding bicarbonate.  She may require ICU admission if she does not respond to current  Pertinent  Medical History   Past Medical History:  Diagnosis Date   Acute delirium 08/30/2023   Acute GI bleeding 08/18/2023   Arthritis    Asthma    Breast cancer (HCC) 2001   Left Breast Cancer   CHF (congestive heart failure) (HCC)    Closed fracture of right hip (HCC) 09/23/2022   Closed intertrochanteric fracture of right femur, initial encounter (HCC) 09/22/2022   COPD (chronic obstructive pulmonary disease) (HCC)    Coronary artery disease    GERD (gastroesophageal reflux disease)    History of mitral valve prosthesis 07/31/2023   History of mitral valve replacement with mechanical valve    HLD (hyperlipidemia)    Hypertension    Personal history of breast cancer    Left Breast Cancer     Pulmonary embolism (HCC) 2021     Significant  Hospital Events: Including procedures, antibiotic start and stop dates in addition to other pertinent events     Interim History / Subjective:  Pulmonary critical care family bedside for hypotension worsening renal failure elevated white count  Objective   Blood pressure (!) 88/63, pulse (!) 37, temperature 97.6 F (36.4 C), temperature source Oral, resp. rate 14, weight 68 kg, SpO2 100%.        Intake/Output Summary (Last 24 hours) at 10/19/2023 0910 Last data filed at 10/19/2023 7829 Gross per 24 hour  Intake 1599 ml  Output --  Net 1599 ml   Filed Weights   10/18/23 2126  Weight: 68 kg    Examination: General: 73 year old female who could answer questions but is somewhat confused HENT: Oropharynx is unremarkable, no JVD is appreciated Lungs: Decreased air movement throughout diminished breath sounds in the bases Cardiovascular: Heart sounds are regular sinus bradycardia 48 systolic blood pressure of 89 Abdomen: Soft nontender positive bowel sounds Extremities: Warm dry without edema Neuro: Somewhat confused but interactive GU: Has not voided but just had ultrasound of abdomen to check for fluid retention and  Resolved Hospital Problem list     Assessment & Plan:  Hypotension in the setting of known heart failure with cardiac index of 3.  Right heart catheterization performed in February 2020 presents with hypertension, bradycardia 48,50, and altered mental status although she did receive Ativan .  She is further complicated by acute renal failure with potassium 6 for which dialysis catheter has been ordered.  Been on Coumadin  her INR is 3.3.  Pulmonary critical care called to the bedside due to worsening status. Continue gentle fluid resuscitation Consideration for Levophed  due to heart rate and blood pressure 2D echo Cardiac evaluation patient been evaluated for coronary failure in the past Questionable transfer to intensive care unit She has been scheduled for HD  catheter per interventional radiology  Acute on chronic renal failure with potassium of 6.1.  Has been evaluated by renal to be scheduled for hemodialysis. Consider bicarb Agree with hemodialysis Stop supplemental potassium  Leukocytosis with white count of 17 Panculture Agree with empirical antimicrobial therapy Narrow antibiotics as culture data comes and Check procalcitonin  Altered mental status, suspect is multifactorial with injection of Ativan  and with through systemic illness. Hold Ativan  Monitor mental status Dialysis may help  Diabetes mellitus Sliding scale insulin  protocol  o2 dependent respiratory failure in the setting of continued tobacco abuse, COPD, history of breast cancer with radiation treatment. O2 as needed Currently protecting airway Chest x-ray shows mostly chronic changes of COPD and congestive heart failure  Chronic anticoagulation with a history of atrial fibrillation that required cardioversion Hold anticoagulation at this time    Best Practice (right click and "Reselect all SmartList Selections" daily)   Diet/type: NPO DVT prophylaxis Coumadin  Pressure ulcer(s): N/A GI prophylaxis: PPI Lines: N/A Foley:  N/A Code Status:  full code Last date of multidisciplinary goals of care discussion [TBD]  Labs   CBC: Recent Labs  Lab 10/18/23 2342 10/19/23 0122 10/19/23 0530 10/19/23 0600 10/19/23 0605  WBC 19.7*  --  17.5*  --   --   HGB 10.3* 12.9 10.7* 11.2* 11.2*  HCT 38.3 38.0 36.7 33.0* 33.0*  MCV 99.2  --  92.4  --   --   PLT 417*  --  443*  --   --     Basic Metabolic Panel: Recent Labs  Lab 10/13/23 1549 10/19/23 0115 10/19/23 0122 10/19/23 0523 10/19/23 0600 10/19/23 0605  NA 134 129* 126* 128* 130* 129*  K 5.0 5.8* 5.6* 6.1* 5.7* 6.5*  CL 97 98 103 100  --   --   CO2 18* 18*  --  17*  --   --   GLUCOSE 94 88 86 90  --   --   BUN 70* 104* 111* 102*  --   --   CREATININE 1.85* 4.04* 4.10* 4.07*  --   --   CALCIUM  10.0  9.1  --  8.9  --   --   MG  --   --   --  2.8*  --   --   PHOS  --   --   --  7.9*  --   --    GFR: Estimated Creatinine Clearance: 11.1 mL/min (A) (by C-G formula based on SCr of 4.07 mg/dL (H)). Recent Labs  Lab 10/18/23 2342 10/19/23 0523 10/19/23 0530  WBC 19.7*  --  17.5*  LATICACIDVEN  --  0.9  --     Liver Function Tests: Recent Labs  Lab 10/19/23 0523  AST 18  ALT 20  ALKPHOS 108  BILITOT 0.8  PROT 6.9  ALBUMIN 3.5  3.5   No results for input(s): "LIPASE", "AMYLASE" in the last 168 hours. No results for input(s): "AMMONIA" in the last 168 hours.  ABG    Component Value Date/Time   PHART 7.345 (L) 01/09/2021 1341   PCO2ART 44.2 01/09/2021 1341   PO2ART 78 (L) 01/09/2021 1341   HCO3 19.4 (L) 10/19/2023 0605   TCO2 21 (L)  10/19/2023 0605   ACIDBASEDEF 9.0 (H) 10/19/2023 0605   O2SAT 72 10/19/2023 0605     Coagulation Profile: Recent Labs  Lab 10/19/23 0115  INR 3.3*    Cardiac Enzymes: Recent Labs  Lab 10/19/23 0632  CKTOTAL 78    HbA1C: Hgb A1c MFr Bld  Date/Time Value Ref Range Status  08/18/2023 02:12 AM 6.0 (H) 4.8 - 5.6 % Final    Comment:    (NOTE) Pre diabetes:          5.7%-6.4%  Diabetes:              >6.4%  Glycemic control for   <7.0% adults with diabetes   09/21/2022 10:24 AM 5.5 4.8 - 5.6 % Final    Comment:    (NOTE)         Prediabetes: 5.7 - 6.4         Diabetes: >6.4         Glycemic control for adults with diabetes: <7.0     CBG: Recent Labs  Lab 10/19/23 0645 10/19/23 0734  GLUCAP 61* 213*    Review of Systems:   na  Past Medical History:  She,  has a past medical history of Acute delirium (08/30/2023), Acute GI bleeding (08/18/2023), Arthritis, Asthma, Breast cancer (HCC) (2001), CHF (congestive heart failure) (HCC), Closed fracture of right hip (HCC) (09/23/2022), Closed intertrochanteric fracture of right femur, initial encounter (HCC) (09/22/2022), COPD (chronic obstructive pulmonary disease) (HCC),  Coronary artery disease, GERD (gastroesophageal reflux disease), History of mitral valve prosthesis (07/31/2023), History of mitral valve replacement with mechanical valve, HLD (hyperlipidemia), Hypertension, Personal history of breast cancer, and Pulmonary embolism (HCC) (2021).   Surgical History:   Past Surgical History:  Procedure Laterality Date   APPLICATION OF A-CELL OF CHEST/ABDOMEN Left 01/27/2019   Procedure: APPLICATION OF A-CELL OF CHEST;  Surgeon: Thornell Flirt, DO;  Location: [facility] OR;  Service: Plastics;  Laterality: Left;   APPLICATION OF WOUND VAC N/A 01/27/2019   Procedure: APPLICATION OF WOUND VAC;  Surgeon: Thornell Flirt, DO;  Location: [facility] OR;  Service: Plastics;  Laterality: N/A;   BREAST SURGERY Left 2001   for CA   CARDIOVERSION N/A 02/26/2022   Procedure: CARDIOVERSION;  Surgeon: Wendie Hamburg, MD;  Location: [facility] ENDOSCOPY;  Service: Cardiovascular;  Laterality: N/A;   COLONOSCOPY WITH PROPOFOL  N/A 06/07/2018   Procedure: COLONOSCOPY WITH PROPOFOL ;  Surgeon: Baldo Bonds, MD;  Location: [facility] ENDOSCOPY;  Service: Endoscopy;  Laterality: N/A;   DEBRIDEMENT AND CLOSURE WOUND Left 12/28/2018   Procedure: Debridement And Closure Wound;  Surgeon: Oza Blumenthal, MD;  Location[facility] OR;  Service: General;  Laterality: Left;   ESOPHAGOGASTRODUODENOSCOPY (EGD) WITH PROPOFOL  N/A 08/24/2023   Procedure: ESOPHAGOGASTRODUODENOSCOPY (EGD) WITH PROPOFOL ;  Surgeon: Renaye Carp, DO;  Location: [facility] ENDOSCOPY;  Service: Gastroenterology;  Laterality: N/A;   INCISION AND DRAINAGE OF WOUND Left 01/27/2019   Procedure: IRRIGATION AND DEBRIDEMENT OF CHEST WALL;  Surgeon: Thornell Flirt, DO;  Location: [facility] OR;  Service: Plastics;  Laterality: Left;   INTRAMEDULLARY (IM) NAIL INTERTROCHANTERIC N/A 09/23/2022   Procedure: INTRAMEDULLARY (IM) NAIL INTERTROCHANTERIC;  Surgeon: Wes Hamman, MD;  Location: [facility] OR;  Service: Orthopedics;  Laterality: N/A;   IR  FLUORO GUIDE CV LINE RIGHT  10/01/2023   IR US  GUIDE VASC ACCESS RIGHT  10/01/2023   IR VENO/EXT/UNI RIGHT  10/05/2023   KNEE ARTHROSCOPY WITH MEDIAL MENISECTOMY Left 08/06/2021   Procedure: KNEE ARTHROSCOPY WITH PARTIAL MEDIAL MENISECTOMY, DEBRIDEMENT;  Surgeon: Orvan Blanch, MD;  Location: [facility] ORS;  Service: Orthopedics;  Laterality: Left;  1 HR   LATISSIMUS FLAP TO BREAST Left 01/18/2019   Procedure: LEFT LATISSIMUS FLAP TO BREAST;  Surgeon: Thornell Flirt, DO;  Location: [facility] OR;  Service: Plastics;  Laterality: Left;   MASTECTOMY Left    MITRAL VALVE REPLACEMENT     mechanical   POLYPECTOMY  06/07/2018   Procedure: POLYPECTOMY;  Surgeon: Baldo Bonds, MD;  Location: [facility] ENDOSCOPY;  Service: Endoscopy;;   RIGHT HEART CATH N/A 08/20/2023   Procedure: RIGHT HEART CATH;  Surgeon: Mardell Shade, MD;  Location: [facility] INVASIVE CV LAB;  Service: Cardiovascular;  Laterality: N/A;   RIGHT/LEFT HEART CATH AND CORONARY ANGIOGRAPHY N/A 01/09/2021   Procedure: RIGHT/LEFT HEART CATH AND CORONARY ANGIOGRAPHY;  Surgeon: Mardell Shade, MD;  Location: [facility] INVASIVE CV LAB;  Service: Cardiovascular;  Laterality: N/A;   WOUND DEBRIDEMENT Left 12/28/2018   Procedure: EXCISION OF LEFT CHRONIC CHEST WALL WOUND;  Surgeon: Oza Blumenthal, MD;  Location: [facility] OR;  Service: General;  Laterality: Left;     Social History:   reports that she has been smoking cigarettes. She has a 3 pack-year smoking history. She has never used smokeless tobacco. She reports current alcohol use of about 3.0 standard drinks of alcohol per week. She reports that she does not use drugs.   Family History:  Her family history includes Breast cancer (age of onset: 54) in her maternal aunt; Cancer in her father; Heart attack in an other family member; Hypertension in her father and mother.   Allergies Allergies  Allergen Reactions   Lisinopril  Swelling   Acetaminophen -Codeine Itching   Propoxyphene Itching    Darvocet    Tape Rash    Plastic   Tramadol  Itching, Nausea And Vomiting and Rash     Home Medications  Prior to Admission medications   Medication Sig Start Date End Date Taking? Authorizing Provider  albuterol  (VENTOLIN  HFA) 108 (90 Base) MCG/ACT inhaler Inhale 1 puff into the lungs every 4 (four) hours as needed for wheezing or shortness of breath. 09/14/23  Yes Juberg, Christopher, DO  colchicine  0.6 MG tablet Take 0.5 tablets (0.3 mg total) by mouth daily. 10/08/23  Yes Cleven Dallas, DO  FARXIGA  10 MG TABS tablet Take 1 tablet by mouth once daily 08/31/23  Yes Amoako, Prince, MD  fluticasone  (FLONASE ) 50 MCG/ACT nasal spray Place 2 sprays into both nostrils daily. Patient taking differently: Place 1 spray into both nostrils daily. 05/17/23 05/16/24 Yes [name], MD  Fluticasone -Umeclidin-Vilant (TRELEGY ELLIPTA ) 100-62.5-25 MCG/ACT AEPB Inhale 1 puff into the lungs daily. 09/14/23  Yes Carleen Chary, DO  gabapentin  (NEURONTIN ) 100 MG capsule Take 2 capsules (200 mg total) by mouth 3 (three) times daily. 10/08/23  Yes Cleven Dallas, DO  HYDROcodone -acetaminophen  (NORCO/VICODIN) 5-325 MG tablet Take 1 tablet by mouth every 4 (four) hours as needed for moderate pain (pain score 4-6). Diagnosis- chronic low back pain 09/17/23  Yes Lovorn, Megan, MD  hydrOXYzine  (ATARAX ) 10 MG tablet TAKE 1 TABLET BY MOUTH THREE TIMES DAILY AS NEEDED FOR ANXIETY 10/18/23  Yes Amoako, Prince, MD  losartan  (COZAAR ) 25 MG tablet Take 0.5 tablets (12.5 mg total) by mouth daily. 08/10/23 11/08/23 Yes Milford, Arlice Bene, FNP  metoprolol  succinate (TOPROL -XL) 25 MG 24 hr tablet Take 1 tablet (25 mg total) by mouth 2 (two) times daily. 08/01/23  Yes Jayson Michael, MD  potassium chloride  (KLOR-CON  M) 10 MEQ tablet Take 2 tablets (20 mEq total)  by mouth daily. 09/07/23 04/04/24 Yes Amoako, Prince, MD  senna-docusate (SENOKOT-S) 8.6-50 MG tablet Take 1 tablet by mouth at bedtime as needed for mild constipation. 10/08/23  Yes  Cleven Dallas, DO  simvastatin  (ZOCOR ) 20 MG tablet Take 1 tablet (20 mg total) by mouth daily. 12/03/22  Yes Allana Arabia, MD  torsemide  (DEMADEX ) 20 MG tablet Take 4 tablets (80 mg total) by mouth daily. 10/08/23  Yes Cleven Dallas, DO  Vitamin D , Ergocalciferol , (DRISDOL ) 1.25 MG (50000 UNIT) CAPS capsule Take 1 capsule (50,000 Units total) by mouth every 7 (seven) days. 10/11/23  Yes Cleven Dallas, DO  warfarin (COUMADIN ) 5 MG tablet Take 1 tablet (5 mg total) by mouth daily at 4 PM. 10/08/23  Yes Cleven Dallas, DO  allopurinol  (ZYLOPRIM ) 100 MG tablet Take 0.5 tablets (50 mg total) by mouth every other day. Patient not taking: Reported on 10/19/2023 10/13/23   Malen Scudder, DO     Critical care time: 45 min     Siegfried Dress Safiyyah Vasconez ACNP Acute Care Nurse Practitioner [facility] Pulmonary/Critical Care Please consult Amion 10/19/2023, 9:10 AM

## 2023-10-19 NOTE — Progress Notes (Signed)
 RT x2 attempted to place aline and were unsuccessful. CCM MD made aware and to place femoral line. RT will continue to monitor and be available as needed.

## 2023-10-19 NOTE — ED Notes (Signed)
 Delay in pt care, pt has had 2 different people try get an IV on her plus 2 lab staff members attempt to get labs. PT is a very hard stick and only had the right arm to work with. IV team consulted.

## 2023-10-19 NOTE — Progress Notes (Addendum)
 Kim Anthony is a 73 year old female with a past medical history of atrial fibrillation, mitral valve replacement on Coumadin , COPD, right-sided heart failure, CKD 3B, gout, and history of breast cancer who presented to the emergency department with shortness of breath and neck pain. She was recently admitted on 09/29/2023-10/08/2023 for CHF exacerbation. During her prior hospitalization she was diuresed with IV furosemide  80 mg twice daily and given milrinone  for cardiac output support, she was then decreased to furosemide  IV 80mg  once daily, weaned off the milrinone  and discharged with torsemide  80 mg p.o. daily.  During her hospitalization, when she was transitioned to p.o. torsemide , her creatinine initially bumped from 1.7 -> 2.3. Her dry weight was 71.8 Kgs. She followed up on 10/13/2023 and she complained of itching at that time. Cr was 1.85 Bicarb 18 and potassium was 5. IMTS was paged last night for admission as she had an AKI (Cr 4.04). However, on evaluation she was found altered, only alert and oriented to self, somnolent, bradycardic, hypotensive, and with warm extremities but cold feet. She also had bilateral parotid enlargement, WBC at 17, Lactic WNL, Cr 4.04 (Baseline ~1.8) BUN 102 K 6.1 s/p calcium  gluconate and insulin , but was having rigors, and itch.  On bedside ultrasound it seemed that she did have IVC variability, and even though she was bradycardic, it did not seem she was volume overloaded.  Her dry weight at admission was 68 kg (decreased form 71.8Kg).  Could be that she has been over diuresed and uremia is causing her symptoms.  However she may also be having an infection with an increased white blood cell count although she remains afebrile.  Given that she is bradycardic, hypotensive, having cold extremities, has an altered mental status, and history of right sided heart failure, PCCM was consulted who agreed on her transfer to ICU as she may benefit from more intensive care.  Nephrology  evaluated this morning who is sending urine studies and following . IMTS will be happy to resume her care once stable to come back to the floor.    Signature: [facility]  Internal Medicine Resident, PGY-1 [facility]  9:49 AM, 10/19/2023  On call pager [phone].

## 2023-10-19 NOTE — Progress Notes (Signed)
 Pharmacy Antibiotic Note  Kim Anthony is a 73 y.o. female admitted on 10/18/2023 with concern for sepsis.  Noted to be in significant AKI - Scr 4.04, baseline ~1.4. Pharmacy has been consulted for vancomycin  dosing.  MRSA swab negative but unsure of sepsis source. Patient started on CRRT and tolerating, will dose vancomycin  accordingly.   Plan: Start vancomycin  1000mg  IV Q24H Levels as indicated Zosyn  3.375mg  over 30 min Q6H F/u cultures, clinical progression, and nephro recs  Weight: 75.7 kg (166 lb 14.2 oz)  Temp (24hrs), Avg:96.2 F (35.7 C), Min:93.9 F (34.4 C), Max:98 F (36.7 C)  Recent Labs  Lab 10/13/23 1549 10/18/23 2342 10/19/23 0115 10/19/23 0122 10/19/23 0523 10/19/23 0530 10/19/23 0842  WBC  --  19.7*  --   --   --  17.5*  --   CREATININE 1.85*  --  4.04* 4.10* 4.07*  --   --   LATICACIDVEN  --   --   --   --  0.9  --  1.0    Estimated Creatinine Clearance: 12.5 mL/min (A) (by C-G formula based on SCr of 4.07 mg/dL (H)).    Allergies  Allergen Reactions   Lisinopril  Swelling   Acetaminophen -Codeine Itching   Propoxyphene Itching    Darvocet   Tape Rash    Plastic   Tramadol  Itching, Nausea And Vomiting and Rash    Antimicrobials this admission: Zosyn  4/29 > Vancomycin  4/29 >   Microbiology results: 4/29 MRSA neg 4/29 bcx:  4/29 resp pcr: negative  4/29 resp (20 pathogen): negative  Thank you for allowing pharmacy to be a part of this patient's care.  [name] 10/19/2023 2:51 PM

## 2023-10-19 NOTE — Progress Notes (Signed)
 Pharmacy Antibiotic Note  Kim Anthony is a 72 y.o. female admitted on 10/18/2023 with concern for sepsis.  Noted to be in significant AKI - Scr 4.04, baseline ~1.4. Pharmacy has been consulted for vancomycin  dosing.  Plan: Vancomycin  1500mg  x 1  Vancomycin  dose per levels with unstable renal function F/u infectious work up, vancomycin  levels as needed and length of therapy  Weight: 68 kg (149 lb 14.6 oz)  Temp (24hrs), Avg:97.8 F (36.6 C), Min:97.6 F (36.4 C), Max:98 F (36.7 C)  Recent Labs  Lab 10/13/23 1549 10/18/23 2342 10/19/23 0115 10/19/23 0122  WBC  --  19.7*  --   --   CREATININE 1.85*  --  4.04* 4.10*    Estimated Creatinine Clearance: 11 mL/min (A) (by C-G formula based on SCr of 4.1 mg/dL (H)).    Allergies  Allergen Reactions   Lisinopril  Swelling   Acetaminophen -Codeine Itching   Propoxyphene Itching    Darvocet   Tape Rash    Plastic   Tramadol  Itching, Nausea And Vomiting and Rash    Antimicrobials this admission: Zosyn  4/29 > Vancomycin  4/29 >   Microbiology results: 4/29 bcx:  4/29 resp pcr: negative  4/29 resp (20 pathogen): negative  Thank you for allowing pharmacy to be a part of this patient's care.  Fonda Hymen 10/19/2023 5:12 AM

## 2023-10-19 NOTE — Progress Notes (Signed)
 PHARMACY - ANTICOAGULATION CONSULT NOTE  Pharmacy Consult for warfarin Indication: MVR  Allergies  Allergen Reactions   Lisinopril  Swelling   Acetaminophen -Codeine Itching   Propoxyphene Itching    Darvocet   Tape Rash    Plastic   Tramadol  Itching, Nausea And Vomiting and Rash    Patient Measurements: Weight: 68 kg (149 lb 14.6 oz)  Vital Signs: Temp: 97.6 F (36.4 C) (04/29 0707) Temp Source: Oral (04/29 0707) BP: 91/56 (04/29 0719) Pulse Rate: 51 (04/29 0707)  Labs: Recent Labs    10/18/23 2342 10/19/23 0115 10/19/23 0122 10/19/23 0325 10/19/23 0523 10/19/23 0530 10/19/23 0600 10/19/23 0605  HGB 10.3*  --  12.9  --   --  10.7* 11.2* 11.2*  HCT 38.3  --  38.0  --   --  36.7 33.0* 33.0*  PLT 417*  --   --   --   --  443*  --   --   LABPROT  --  33.4*  --   --   --   --   --   --   INR  --  3.3*  --   --   --   --   --   --   CREATININE  --  4.04* 4.10*  --  4.07*  --   --   --   TROPONINIHS  --  18*  --  17  --   --   --   --     Estimated Creatinine Clearance: 11.1 mL/min (A) (by C-G formula based on SCr of 4.07 mg/dL (H)).   Medical History: Past Medical History:  Diagnosis Date   Acute delirium 08/30/2023   Acute GI bleeding 08/18/2023   Arthritis    Asthma    Breast cancer (HCC) 2001   Left Breast Cancer   CHF (congestive heart failure) (HCC)    Closed fracture of right hip (HCC) 09/23/2022   Closed intertrochanteric fracture of right femur, initial encounter (HCC) 09/22/2022   COPD (chronic obstructive pulmonary disease) (HCC)    Coronary artery disease    GERD (gastroesophageal reflux disease)    History of mitral valve prosthesis 07/31/2023   History of mitral valve replacement with mechanical valve    HLD (hyperlipidemia)    Hypertension    Personal history of breast cancer    Left Breast Cancer     Pulmonary embolism (HCC) 2021   Assessment: Kim Anthony is a 73 y.o. year old female presented on 10/18/2023 with concern for AMS. On  warfarin prior to admission for MVR and hx PE. Prior to admission regimen, 5 mg PO daily (total weekly dose 35 mg). Last dose prior to admission 10/17/23 PM. Pharmacy consulted for warfarin inpatient management.   Date INR Warfarin Dose  10/19/23 3.3 5 mg   4/29: INR 3.3, therapeutic on warfarin 5 mg PO daily. Noted patient missed her dose yesterday, 4/28, and suspect INR will start to decrease tomorrow. CBC stable (Hgb 11.2, PLT 443)   Goal of Therapy:  INR goal 2.5-3.5  Monitor platelets by anticoagulation protocol: Yes   Plan:  Warfarin 5 mg PO x1 dose Check INR daily while on warfarin Monitor CBC and s/sx of bleeding daily  Thank you for allowing pharmacy to participate in this patient's care.  Volney Grumbles, PharmD PGY-1 Acute Care Pharmacy Resident 10/19/2023 7:25 AM

## 2023-10-19 NOTE — Procedures (Signed)
 Central Venous Catheter Insertion Procedure Note  Kim Anthony  [id number]  1951/04/30  Date:10/19/23  Time:12:55 PM   Provider Performing:[name]   Procedure: Insertion of Non-tunneled Central Venous Catheter(36556)with US  guidance (86578)    Indication(s) Hemodialysis  Consent Risks of the procedure as well as the alternatives and risks of each were explained to the patient and/or caregiver.  Consent for the procedure was obtained and is signed in the bedside chart  Anesthesia Topical only with 1% lidocaine    Timeout Verified patient identification, verified procedure, site/side was marked, verified correct patient position, special equipment/implants available, medications/allergies/relevant history reviewed, required imaging and test results available.  Sterile Technique Maximal sterile technique including full sterile barrier drape, hand hygiene, sterile gown, sterile gloves, mask, hair covering, sterile ultrasound probe cover (if used).  Procedure Description Area of catheter insertion was cleaned with chlorhexidine  and draped in sterile fashion.   With real-time ultrasound guidance a HD catheter was placed into the right internal jugular vein.  Nonpulsatile blood flow and easy flushing noted in all ports.  The catheter was sutured in place and sterile dressing applied.  Complications/Tolerance None; patient tolerated the procedure well. Chest X-ray is ordered to verify placement for internal jugular or subclavian cannulation.  Chest x-ray is not ordered for femoral cannulation.  EBL Minimal  Specimen(s) None

## 2023-10-19 NOTE — Progress Notes (Addendum)
 eLink Physician-Brief Progress Note Patient Name: Kim Anthony DOB: 06/11/1951 [name]: [id number]   Date of Service  10/19/2023  HPI/Events of Note  Patient has been very restless and agitated and RN asking for an order for precedex  to help her with CRRT as well. BP is ok on low dose levophed . HR is 67 so will need to be watched on precedex .   eICU Interventions  Precedex  ordered     Intervention Category Major Interventions: Delirium, psychosis, severe agitation - evaluation and management  Ellaina Schuler G Franki Stemen 10/19/2023, 8:11 PM  Addendum at 10-35 pm - Resp panel, covid and flu are negative. RN asked if isolation order can be removed. Done.

## 2023-10-19 NOTE — ED Notes (Signed)
 BG was taken before insulin  and Dextrose  was given. PT got 50g of dextrose  before getting 5 units of IV insulin 

## 2023-10-19 NOTE — Consult Note (Addendum)
 Reason for Consult: Renal failure Referring Physician: Dr. Gwenetta Lennert  Chief Complaint: Shortness of breath   Assessment/Plan: AKI on CKD3b (BL 1.4-1.9) discharged recently with a cr of 1.92 in the setting previously with severe right sided heart failure. Hyperkalemia treated medically and at time of consultation she had received Lokelma 10gm x1. CXR showed cardiomegaly but no vascular congestion. - will send urine studies but FeNA can be low with prerenal azotemia or CHF exacerbation. Weight listed as 68kg, listed as 71.8kg close to discharge previously.  - Hold losartan , torsemide , farxiga . - check bladder scan for PVR and will order renal ultrasound. - will give another 500mL uf NS -> d/w Dr. Dione Franks and will initiate CRRT. BP has dropped since when I 1st saw her this AM.  D/W daughter [name] and she agrees.  Appreciate RIJ temp cath by CCM; seen on CRRT 2K baths pre/post/dialysate 400/400/1500 UF net even  Likely will need to switch to 4K later today  -Monitor Daily I/Os, Daily weight  -Maintain MAP>65 for optimal renal perfusion.  - Avoid nephrotoxic agents such as IV contrast, NSAIDs, and phosphate containing bowel preps (FLEETS)  AMS possibly from sepsis with leukocytosis but certainly uremia could be contributing as well. Being treated with broad spectrum abx. Combined CHF with elevated BNP and h/o noncompliance and discharged on torsemide  80mg  daily from recent hospitalization.  Atrial fibrillation  on coumadin , subtherapeutic at time of discharge recently but now INR 3.3.  HTN COPD   HPI: Kim Anthony is an 72 y.o. female COPD, MVR on coumadin , atrial fibrillation, chronic constipation on chronic opioids, s/p lt mastectomy w breast cancer, CKD3b (BL 1.4-1.9)recently admitted with heart failure, pulmonary HTN with severely reduced RV function diuresed. She has a history of poor compliance with with diet and diuretics; at time of discharge she was sat'g 90% without O2 and d/c on  torsemide  80mg  daily.  At time of discharge her Cr was 1.92.  She's presenting with shortness of breath, somnolence, AMS, neck pain.   She was noted to be mildly hypotensive in the ED, Na 129, K5.8 BUN/Cr 104/4.0.   ROS Pertinent items are noted in HPI.  Chemistry and CBC: Creatinine, Ser  Date/Time Value Ref Range Status  10/19/2023 05:23 AM 4.07 (H) 0.44 - 1.00 mg/dL Final  16/03/9603 54:09 AM 4.10 (H) 0.44 - 1.00 mg/dL Final  81/19/1478 29:56 AM 4.04 (H) 0.44 - 1.00 mg/dL Final  21/30/8657 84:69 PM 1.85 (H) 0.57 - 1.00 mg/dL Final  62/95/2841 32:44 AM 1.92 (H) 0.44 - 1.00 mg/dL Final  06/24/7251 66:44 PM 2.13 (H) 0.44 - 1.00 mg/dL Final  03/47/4259 56:38 AM 2.33 (H) 0.44 - 1.00 mg/dL Final  75/64/3329 51:88 AM 1.73 (H) 0.44 - 1.00 mg/dL Final  41/66/0630 16:01 AM 1.47 (H) 0.44 - 1.00 mg/dL Final  09/32/3557 32:20 AM 1.43 (H) 0.44 - 1.00 mg/dL Final  25/42/7062 37:62 AM 1.39 (H) 0.44 - 1.00 mg/dL Final  83/15/1761 60:73 AM 1.97 (H) 0.44 - 1.00 mg/dL Final  71/11/2692 85:46 AM 2.04 (H) 0.44 - 1.00 mg/dL Final  27/08/5007 38:18 AM 1.91 (H) 0.44 - 1.00 mg/dL Final  29/93/7169 67:89 AM 1.94 (H) 0.44 - 1.00 mg/dL Final  38/03/1750 02:58 AM 2.64 (H) 0.44 - 1.00 mg/dL Final  52/77/8242 35:36 AM 2.70 (H) 0.44 - 1.00 mg/dL Final  14/43/1540 08:67 PM 2.67 (H) 0.44 - 1.00 mg/dL Final  61/95/0932 67:12 AM 2.23 (H) 0.44 - 1.00 mg/dL Final  45/80/9983 38:25 AM 1.46 (H) 0.44 - 1.00  mg/dL Final  62/13/0865 78:46 AM 1.85 (H) 0.44 - 1.00 mg/dL Final  96/29/5284 13:24 AM 1.66 (H) 0.44 - 1.00 mg/dL Final  40/03/2724 36:64 AM 1.92 (H) 0.44 - 1.00 mg/dL Final  40/34/7425 95:63 AM 1.76 (H) 0.44 - 1.00 mg/dL Final  87/56/4332 95:18 AM 1.92 (H) 0.44 - 1.00 mg/dL Final  84/16/6063 01:60 AM 1.88 (H) 0.44 - 1.00 mg/dL Final  10/93/2355 73:22 AM 1.41 (H) 0.44 - 1.00 mg/dL Final  02/54/2706 23:76 AM 1.46 (H) 0.44 - 1.00 mg/dL Final  28/31/5176 16:07 PM 1.45 (H) 0.44 - 1.00 mg/dL Final  37/03/6268 48:54 AM  1.32 (H) 0.44 - 1.00 mg/dL Final  62/70/3500 93:81 PM 1.39 (H) 0.44 - 1.00 mg/dL Final  82/99/3716 96:78 AM 1.23 (H) 0.44 - 1.00 mg/dL Final  93/81/0175 10:25 PM 1.40 (H) 0.44 - 1.00 mg/dL Final  85/27/7824 23:53 AM 1.32 (H) 0.44 - 1.00 mg/dL Final  61/44/3154 00:86 AM 1.30 (H) 0.44 - 1.00 mg/dL Final  76/19/5093 26:71 AM 1.05 (H) 0.44 - 1.00 mg/dL Final  24/58/0998 33:82 PM 0.87 0.44 - 1.00 mg/dL Final  50/53/9767 34:19 AM 0.85 0.44 - 1.00 mg/dL Final  37/90/2409 73:53 PM 0.97 0.44 - 1.00 mg/dL Final  29/92/4268 34:19 PM 0.93 0.44 - 1.00 mg/dL Final  62/22/9798 92:11 AM 1.02 (H) 0.44 - 1.00 mg/dL Final  94/17/4081 44:81 PM 1.18 (H) 0.44 - 1.00 mg/dL Final  85/63/1497 02:63 AM 1.47 (H) 0.44 - 1.00 mg/dL Final  78/58/8502 77:41 AM 2.17 (H) 0.44 - 1.00 mg/dL Final  28/78/6767 20:94 PM 2.81 (H) 0.44 - 1.00 mg/dL Final  70/96/2836 62:94 AM 3.12 (H) 0.44 - 1.00 mg/dL Final  76/54/6503 54:65 AM 4.27 (H) 0.44 - 1.00 mg/dL Final  68/05/7516 00:17 AM 4.34 (H) 0.44 - 1.00 mg/dL Final  49/44/9675 91:63 PM 4.14 (H) 0.44 - 1.00 mg/dL Final  84/66/5993 57:01 PM 2.10 (H) 0.44 - 1.00 mg/dL Final  77/93/9030 09:23 AM 1.00 0.44 - 1.00 mg/dL Final  30/12/6224 33:35 AM 1.14 (H) 0.44 - 1.00 mg/dL Final   Recent Labs  Lab 10/13/23 1549 10/19/23 0115 10/19/23 0122 10/19/23 0523 10/19/23 0600 10/19/23 0605  NA 134 129* 126* 128* 130* 129*  K 5.0 5.8* 5.6* 6.1* 5.7* 6.5*  CL 97 98 103 100  --   --   CO2 18* 18*  --  17*  --   --   GLUCOSE 94 88 86 90  --   --   BUN 70* 104* 111* 102*  --   --   CREATININE 1.85* 4.04* 4.10* 4.07*  --   --   CALCIUM  10.0 9.1  --  8.9  --   --   PHOS  --   --   --  7.9*  --   --    Recent Labs  Lab 10/18/23 2342 10/19/23 0122 10/19/23 0530 10/19/23 0600 10/19/23 0605  WBC 19.7*  --  17.5*  --   --   HGB 10.3* 12.9 10.7* 11.2* 11.2*  HCT 38.3 38.0 36.7 33.0* 33.0*  MCV 99.2  --  92.4  --   --   PLT 417*  --  443*  --   --    Liver Function Tests: Recent  Labs  Lab 10/19/23 0523  AST 18  ALT 20  ALKPHOS 108  BILITOT 0.8  PROT 6.9  ALBUMIN 3.5  3.5   No results for input(s): "LIPASE", "AMYLASE" in the last 168 hours. No results for input(s): "AMMONIA" in the last  168 hours. Cardiac Enzymes: Recent Labs  Lab 10/19/23 0632  CKTOTAL 78   Iron  Studies: No results for input(s): "IRON ", "TIBC", "TRANSFERRIN", "FERRITIN" in the last 72 hours. PT/INR: @LABRCNTIP (inr:5)  Xrays/Other Studies: ) Results for orders placed or performed during the hospital encounter of 10/18/23 (from the past 48 hours)  CBC     Status: Abnormal   Collection Time: 10/18/23 11:42 PM  Result Value Ref Range   WBC 19.7 (H) 4.0 - 10.5 K/uL   RBC 3.86 (L) 3.87 - 5.11 MIL/uL   Hemoglobin 10.3 (L) 12.0 - 15.0 g/dL   HCT 86.5 78.4 - 69.6 %   MCV 99.2 80.0 - 100.0 fL   MCH 26.7 26.0 - 34.0 pg   MCHC 26.9 (L) 30.0 - 36.0 g/dL   RDW 29.5 (H) 28.4 - 13.2 %   Platelets 417 (H) 150 - 400 K/uL   nRBC 0.0 0.0 - 0.2 %    Comment: Performed at [facility], [address], [place], [place] [address]  Brain natriuretic peptide     Status: Abnormal   Collection Time: 10/19/23  1:15 AM  Result Value Ref Range   B Natriuretic Peptide 911.0 (H) 0.0 - 100.0 pg/mL    Comment: Performed at [facility], [address], [place], [place] [address]  Troponin I (High Sensitivity)     Status: Abnormal   Collection Time: 10/19/23  1:15 AM  Result Value Ref Range   Troponin I (High Sensitivity) 18 (H) <18 ng/L    Comment: (NOTE) Elevated high sensitivity troponin I (hsTnI) values and significant  changes across serial measurements may suggest ACS but many other  chronic and acute conditions are known to elevate hsTnI results.  Refer to the "Links" section for chest pain algorithms and additional  guidance. Performed at [facility], [address] [place], [place] [address]   Basic metabolic panel with GFR     Status: Abnormal   Collection Time: 10/19/23   1:15 AM  Result Value Ref Range   Sodium 129 (L) 135 - 145 mmol/L   Potassium 5.8 (H) 3.5 - 5.1 mmol/L   Chloride 98 98 - 111 mmol/L   CO2 18 (L) 22 - 32 mmol/L   Glucose, Bld 88 70 - 99 mg/dL    Comment: Glucose reference range applies only to samples taken after fasting for at least 8 hours.   BUN 104 (H) 8 - 23 mg/dL   Creatinine, Ser 3.47 (H) 0.44 - 1.00 mg/dL   Calcium  9.1 8.9 - 10.3 mg/dL   GFR, Estimated 11 (L) >60 mL/min    Comment: (NOTE) Calculated using the CKD-EPI Creatinine Equation (2021)    Anion gap 13 5 - 15    Comment: Performed at [facility], [address], [place], [place] [address]  Protime-INR     Status: Abnormal   Collection Time: 10/19/23  1:15 AM  Result Value Ref Range   Prothrombin  Time 33.4 (H) 11.4 - 15.2 seconds   INR 3.3 (H) 0.8 - 1.2    Comment: (NOTE) INR goal varies based on device and disease states. Performed at [facility], [address], [place], [place] [address]   I-stat chem 8, ED (not at [facility], [facility] or [facility])     Status: Abnormal   Collection Time: 10/19/23  1:22 AM  Result Value Ref Range   Sodium 126 (L) 135 - 145 mmol/L   Potassium 5.6 (H) 3.5 -  5.1 mmol/L   Chloride 103 98 - 111 mmol/L   BUN 111 (H) 8 - 23 mg/dL   Creatinine, Ser 5.78 (H) 0.44 - 1.00 mg/dL   Glucose, Bld 86 70 - 99 mg/dL    Comment: Glucose reference range applies only to samples taken after fasting for at least 8 hours.   Calcium , Ion 0.93 (L) 1.15 - 1.40 mmol/L   TCO2 16 (L) 22 - 32 mmol/L   Hemoglobin 12.9 12.0 - 15.0 g/dL   HCT 46.9 62.9 - 52.8 %  Troponin I (High Sensitivity)     Status: None   Collection Time: 10/19/23  3:25 AM  Result Value Ref Range   Troponin I (High Sensitivity) 17 <18 ng/L    Comment: (NOTE) Elevated high sensitivity troponin I (hsTnI) values and significant  changes across serial measurements may suggest ACS but many other  chronic and acute conditions are known to elevate hsTnI results.  Refer to the "Links"  section for chest pain algorithms and additional  guidance. Performed at [facility], [address], [place], [place] [address]   Lactic acid, plasma     Status: None   Collection Time: 10/19/23  5:23 AM  Result Value Ref Range   Lactic Acid, Venous 0.9 0.5 - 1.9 mmol/L    Comment: Performed at [facility], [address] [place], [place] [address]  Renal function panel     Status: Abnormal   Collection Time: 10/19/23  5:23 AM  Result Value Ref Range   Sodium 128 (L) 135 - 145 mmol/L   Potassium 6.1 (H) 3.5 - 5.1 mmol/L   Chloride 100 98 - 111 mmol/L   CO2 17 (L) 22 - 32 mmol/L   Glucose, Bld 90 70 - 99 mg/dL    Comment: Glucose reference range applies only to samples taken after fasting for at least 8 hours.   BUN 102 (H) 8 - 23 mg/dL   Creatinine, Ser 7.25 (H) 0.44 - 1.00 mg/dL   Calcium  8.9 8.9 - 10.3 mg/dL   Phosphorus  7.9 (H) 2.5 - 4.6 mg/dL   Albumin 3.5 3.5 - 5.0 g/dL   GFR, Estimated 11 (L) >60 mL/min    Comment: (NOTE) Calculated using the CKD-EPI Creatinine Equation (2021)    Anion gap 11 5 - 15    Comment: Performed at [facility], [address], [place], [place] [address]  Respiratory (~20 pathogens) panel by PCR     Status: None   Collection Time: 10/19/23  5:23 AM   Specimen: Nasopharyngeal Swab; Respiratory  Result Value Ref Range   Adenovirus NOT DETECTED NOT DETECTED   Coronavirus 229E NOT DETECTED NOT DETECTED    Comment: (NOTE) The Coronavirus on the Respiratory Panel, DOES NOT test for the novel  Coronavirus (2019 nCoV)    Coronavirus HKU1 NOT DETECTED NOT DETECTED   Coronavirus NL63 NOT DETECTED NOT DETECTED   Coronavirus OC43 NOT DETECTED NOT DETECTED   Metapneumovirus NOT DETECTED NOT DETECTED   Rhinovirus / Enterovirus NOT DETECTED NOT DETECTED   Influenza A NOT DETECTED NOT DETECTED   Influenza B NOT DETECTED NOT DETECTED   Parainfluenza Virus 1 NOT DETECTED NOT DETECTED   Parainfluenza Virus 2 NOT DETECTED NOT DETECTED    Parainfluenza Virus 3 NOT DETECTED NOT DETECTED   Parainfluenza Virus 4 NOT DETECTED NOT DETECTED   Respiratory Syncytial Virus NOT DETECTED NOT DETECTED   Bordetella pertussis NOT DETECTED NOT DETECTED   Bordetella Parapertussis NOT  DETECTED NOT DETECTED   Chlamydophila pneumoniae NOT DETECTED NOT DETECTED   Mycoplasma pneumoniae NOT DETECTED NOT DETECTED    Comment: Performed at [facility], [address], [place], [place] [address]  Resp panel by RT-PCR (RSV, Flu A&B, Covid) Nasopharyngeal Swab     Status: None   Collection Time: 10/19/23  5:23 AM   Specimen: Nasopharyngeal Swab; Nasal Swab  Result Value Ref Range   SARS Coronavirus 2 by RT PCR NEGATIVE NEGATIVE   Influenza A by PCR NEGATIVE NEGATIVE   Influenza B by PCR NEGATIVE NEGATIVE    Comment: (NOTE) The Xpert Xpress SARS-CoV-2/FLU/RSV plus assay is intended as an aid in the diagnosis of influenza from Nasopharyngeal swab specimens and should not be used as a sole basis for treatment. Nasal washings and aspirates are unacceptable for Xpert Xpress SARS-CoV-2/FLU/RSV testing.  Fact Sheet for Patients: BloggerCourse.com  Fact Sheet for Healthcare Providers: SeriousBroker.it  This test is not yet approved or cleared by the [place]  FDA and has been authorized for detection and/or diagnosis of SARS-CoV-2 by FDA under an Emergency Use Authorization (EUA). This EUA will remain in effect (meaning this test can be used) for the duration of the COVID-19 declaration under Section 564(b)(1) of the Act, 21 U.S.C. section 360bbb-3(b)(1), unless the authorization is terminated or revoked.     Resp Syncytial Virus by PCR NEGATIVE NEGATIVE    Comment: (NOTE) Fact Sheet for Patients: BloggerCourse.com  Fact Sheet for Healthcare Providers: SeriousBroker.it  This test is not yet approved or cleared by the [place]   FDA and has been authorized for detection and/or diagnosis of SARS-CoV-2 by FDA under an Emergency Use Authorization (EUA). This EUA will remain in effect (meaning this test can be used) for the duration of the COVID-19 declaration under Section 564(b)(1) of the Act, 21 U.S.C. section 360bbb-3(b)(1), unless the authorization is terminated or revoked.  Performed at [facility], [address] [place], [place] [address]   Magnesium      Status: Abnormal   Collection Time: 10/19/23  5:23 AM  Result Value Ref Range   Magnesium  2.8 (H) 1.7 - 2.4 mg/dL    Comment: Performed at [facility], [address] [place], [place] [address]  Hepatic function panel     Status: Abnormal   Collection Time: 10/19/23  5:23 AM  Result Value Ref Range   Total Protein 6.9 6.5 - 8.1 g/dL   Albumin 3.5 3.5 - 5.0 g/dL   AST 18 15 - 41 U/L   ALT 20 0 - 44 U/L   Alkaline Phosphatase 108 38 - 126 U/L   Total Bilirubin 0.8 0.0 - 1.2 mg/dL   Bilirubin, Direct 0.3 (H) 0.0 - 0.2 mg/dL   Indirect Bilirubin 0.5 0.3 - 0.9 mg/dL    Comment: Performed at [facility], [address] [place], [place] [address]  CBC     Status: Abnormal   Collection Time: 10/19/23  5:30 AM  Result Value Ref Range   WBC 17.5 (H) 4.0 - 10.5 K/uL   RBC 3.97 3.87 - 5.11 MIL/uL   Hemoglobin 10.7 (L) 12.0 - 15.0 g/dL   HCT 29.5 62.1 - 30.8 %   MCV 92.4 80.0 - 100.0 fL   MCH 27.0 26.0 - 34.0 pg   MCHC 29.2 (L) 30.0 - 36.0 g/dL   RDW 65.7 (H) 84.6 - 96.2 %   Platelets 443 (H) 150 - 400 K/uL   nRBC 0.2 0.0 -  0.2 %    Comment: Performed at [facility], [address], [place], [place] [address]  I-Stat venous blood gas, ED     Status: Abnormal   Collection Time: 10/19/23  6:00 AM  Result Value Ref Range   pH, Ven 7.173 (LL) 7.25 - 7.43   pCO2, Ven 51.2 44 - 60 mmHg   pO2, Ven 30 (LL) 32 - 45 mmHg   Bicarbonate 18.8 (L) 20.0 - 28.0 mmol/L   TCO2 20 (L) 22 - 32 mmol/L   O2 Saturation 42 %   Acid-base  deficit 10.0 (H) 0.0 - 2.0 mmol/L   Sodium 130 (L) 135 - 145 mmol/L   Potassium 5.7 (H) 3.5 - 5.1 mmol/L   Calcium , Ion 1.14 (L) 1.15 - 1.40 mmol/L   HCT 33.0 (L) 36.0 - 46.0 %   Hemoglobin 11.2 (L) 12.0 - 15.0 g/dL   Sample type VENOUS    Comment NOTIFIED PHYSICIAN   I-Stat venous blood gas, ED     Status: Abnormal   Collection Time: 10/19/23  6:05 AM  Result Value Ref Range   pH, Ven 7.188 (LL) 7.25 - 7.43   pCO2, Ven 51.0 44 - 60 mmHg   pO2, Ven 47 (H) 32 - 45 mmHg   Bicarbonate 19.4 (L) 20.0 - 28.0 mmol/L   TCO2 21 (L) 22 - 32 mmol/L   O2 Saturation 72 %   Acid-base deficit 9.0 (H) 0.0 - 2.0 mmol/L   Sodium 129 (L) 135 - 145 mmol/L   Potassium 6.5 (HH) 3.5 - 5.1 mmol/L   Calcium , Ion 1.12 (L) 1.15 - 1.40 mmol/L   HCT 33.0 (L) 36.0 - 46.0 %   Hemoglobin 11.2 (L) 12.0 - 15.0 g/dL   Sample type VENOUS    Comment NOTIFIED PHYSICIAN   CK     Status: None   Collection Time: 10/19/23  6:32 AM  Result Value Ref Range   Total CK 78 38 - 234 U/L    Comment: Performed at [facility], [address] [place], [place] [address]  CBG monitoring, ED     Status: Abnormal   Collection Time: 10/19/23  6:45 AM  Result Value Ref Range   Glucose-Capillary 61 (L) 70 - 99 mg/dL    Comment: Glucose reference range applies only to samples taken after fasting for at least 8 hours.  CBG monitoring, ED     Status: Abnormal   Collection Time: 10/19/23  7:34 AM  Result Value Ref Range   Glucose-Capillary 213 (H) 70 - 99 mg/dL    Comment: Glucose reference range applies only to samples taken after fasting for at least 8 hours.   *Note: Due to a large number of results and/or encounters for the requested time period, some results have not been displayed. A complete set of results can be found in Results Review.   DG Chest 2 View Result Date: 10/18/2023 CLINICAL DATA:  Shortness of breath and neck pain. Hypoxia on arrival. EXAM: CHEST - 2 VIEW COMPARISON:  AP Lat chest series 09/29/23, 09/19/23.  FINDINGS: Moderate to severe cardiomegaly again noted with MVR. Intact sternotomy sutures. There are no findings of acute CHF. There is chronic pleural-parenchymal disease left apex, left mid field. No new infiltrate is seen. There is no substantial pleural effusion. The mediastinum is normally outlined.  Left axillary surgical clips. There is thoracic kyphosis and degenerative change with no new osseous findings. IMPRESSION: 1. No evidence of acute chest disease. 2. Cardiomegaly  with chronic pleural-parenchymal disease left apex, left mid field. Electronically Signed   By: Denman Fischer M.D.   On: 10/18/2023 22:17    PMH:   Past Medical History:  Diagnosis Date   Acute delirium 08/30/2023   Acute GI bleeding 08/18/2023   Arthritis    Asthma    Breast cancer (HCC) 2001   Left Breast Cancer   CHF (congestive heart failure) (HCC)    Closed fracture of right hip (HCC) 09/23/2022   Closed intertrochanteric fracture of right femur, initial encounter (HCC) 09/22/2022   COPD (chronic obstructive pulmonary disease) (HCC)    Coronary artery disease    GERD (gastroesophageal reflux disease)    History of mitral valve prosthesis 07/31/2023   History of mitral valve replacement with mechanical valve    HLD (hyperlipidemia)    Hypertension    Personal history of breast cancer    Left Breast Cancer     Pulmonary embolism (HCC) 2021    PSH:   Past Surgical History:  Procedure Laterality Date   APPLICATION OF A-CELL OF CHEST/ABDOMEN Left 01/27/2019   Procedure: APPLICATION OF A-CELL OF CHEST;  Surgeon: Thornell Flirt, DO;  Location: [facility] OR;  Service: Plastics;  Laterality: Left;   APPLICATION OF WOUND VAC N/A 01/27/2019   Procedure: APPLICATION OF WOUND VAC;  Surgeon: Thornell Flirt, DO;  Location: [facility] OR;  Service: Plastics;  Laterality: N/A;   BREAST SURGERY Left 2001   for CA   CARDIOVERSION N/A 02/26/2022   Procedure: CARDIOVERSION;  Surgeon: Wendie Hamburg, MD;   Location: [facility] ENDOSCOPY;  Service: Cardiovascular;  Laterality: N/A;   COLONOSCOPY WITH PROPOFOL  N/A 06/07/2018   Procedure: COLONOSCOPY WITH PROPOFOL ;  Surgeon: Baldo Bonds, MD;  Location: [facility] ENDOSCOPY;  Service: Endoscopy;  Laterality: N/A;   DEBRIDEMENT AND CLOSURE WOUND Left 12/28/2018   Procedure: Debridement And Closure Wound;  Surgeon: Oza Blumenthal, MD;  Location: [facility] OR;  Service: General;  Laterality: Left;   ESOPHAGOGASTRODUODENOSCOPY (EGD) WITH PROPOFOL  N/A 08/24/2023   Procedure: ESOPHAGOGASTRODUODENOSCOPY (EGD) WITH PROPOFOL ;  Surgeon: Renaye Carp, DO;  Location: [facility] ENDOSCOPY;  Service: Gastroenterology;  Laterality: N/A;   INCISION AND DRAINAGE OF WOUND Left 01/27/2019   Procedure: IRRIGATION AND DEBRIDEMENT OF CHEST WALL;  Surgeon: Thornell Flirt, DO;  Location: [facility] OR;  Service: Plastics;  Laterality: Left;   INTRAMEDULLARY (IM) NAIL INTERTROCHANTERIC N/A 09/23/2022   Procedure: INTRAMEDULLARY (IM) NAIL INTERTROCHANTERIC;  Surgeon: Wes Hamman, MD;  Location: [facility] OR;  Service: Orthopedics;  Laterality: N/A;   IR FLUORO GUIDE CV LINE RIGHT  10/01/2023   IR US  GUIDE VASC ACCESS RIGHT  10/01/2023   IR VENO/EXT/UNI RIGHT  10/05/2023   KNEE ARTHROSCOPY WITH MEDIAL MENISECTOMY Left 08/06/2021   Procedure: KNEE ARTHROSCOPY WITH PARTIAL MEDIAL MENISECTOMY, DEBRIDEMENT;  Surgeon: Orvan Blanch, MD;  Location: [facility] ORS;  Service: Orthopedics;  Laterality: Left;  1 HR   LATISSIMUS FLAP TO BREAST Left 01/18/2019   Procedure: LEFT LATISSIMUS FLAP TO BREAST;  Surgeon: Thornell Flirt, DO;  Location: [facility] OR;  Service: Plastics;  Laterality: Left;   MASTECTOMY Left    MITRAL VALVE REPLACEMENT     mechanical   POLYPECTOMY  06/07/2018   Procedure: POLYPECTOMY;  Surgeon: Baldo Bonds, MD;  Location: [facility] ENDOSCOPY;  Service: Endoscopy;;   RIGHT HEART CATH N/A 08/20/2023   Procedure: RIGHT HEART CATH;  Surgeon: Mardell Shade, MD;  Location: [facility] INVASIVE CV LAB;   Service: Cardiovascular;  Laterality: N/A;   RIGHT/LEFT HEART  CATH AND CORONARY ANGIOGRAPHY N/A 01/09/2021   Procedure: RIGHT/LEFT HEART CATH AND CORONARY ANGIOGRAPHY;  Surgeon: Mardell Shade, MD;  Location: [facility] INVASIVE CV LAB;  Service: Cardiovascular;  Laterality: N/A;   WOUND DEBRIDEMENT Left 12/28/2018   Procedure: EXCISION OF LEFT CHRONIC CHEST WALL WOUND;  Surgeon: Oza Blumenthal, MD;  Location: [facility] OR;  Service: General;  Laterality: Left;    Allergies:  Allergies  Allergen Reactions   Lisinopril  Swelling   Acetaminophen -Codeine Itching   Propoxyphene Itching    Darvocet   Tape Rash    Plastic   Tramadol  Itching, Nausea And Vomiting and Rash    Medications:   Prior to Admission medications   Medication Sig Start Date End Date Taking? Authorizing Provider  albuterol  (VENTOLIN  HFA) 108 (90 Base) MCG/ACT inhaler Inhale 1 puff into the lungs every 4 (four) hours as needed for wheezing or shortness of breath. 09/14/23   Juberg, Veryl Gottron, DO  allopurinol  (ZYLOPRIM ) 100 MG tablet Take 0.5 tablets (50 mg total) by mouth every other day. 10/13/23   Malen Scudder, DO  colchicine  0.6 MG tablet Take 0.5 tablets (0.3 mg total) by mouth daily. 10/08/23   Cleven Dallas, DO  FARXIGA  10 MG TABS tablet Take 1 tablet by mouth once daily 08/31/23   Amoako, Prince, MD  fluticasone  (FLONASE ) 50 MCG/ACT nasal spray Place 2 sprays into both nostrils daily. Patient taking differently: Place 1 spray into both nostrils daily. 05/17/23 05/16/24  Amoako, Prince, MD  Fluticasone -Umeclidin-Vilant (TRELEGY ELLIPTA ) 100-62.5-25 MCG/ACT AEPB Inhale 1 puff into the lungs daily. 09/14/23   Carleen Chary, DO  gabapentin  (NEURONTIN ) 100 MG capsule Take 2 capsules (200 mg total) by mouth 3 (three) times daily. 10/08/23   Cleven Dallas, DO  HYDROcodone -acetaminophen  (NORCO/VICODIN) 5-325 MG tablet Take 1 tablet by mouth every 4 (four) hours as needed for moderate pain (pain score 4-6). Diagnosis- chronic  low back pain 09/17/23   Lovorn, Megan, MD  hydrOXYzine  (ATARAX ) 10 MG tablet TAKE 1 TABLET BY MOUTH THREE TIMES DAILY AS NEEDED FOR ANXIETY 10/18/23   Amoako, Prince, MD  losartan  (COZAAR ) 25 MG tablet Take 0.5 tablets (12.5 mg total) by mouth daily. 08/10/23 11/08/23  Elmarie Hacking, FNP  metoprolol  succinate (TOPROL -XL) 25 MG 24 hr tablet Take 1 tablet (25 mg total) by mouth 2 (two) times daily. 08/01/23   Jayson Michael, MD  potassium chloride  (KLOR-CON  M) 10 MEQ tablet Take 2 tablets (20 mEq total) by mouth daily. 09/07/23 04/04/24  [name], MD  senna-docusate (SENOKOT-S) 8.6-50 MG tablet Take 1 tablet by mouth at bedtime as needed for mild constipation. 10/08/23   Cleven Dallas, DO  simvastatin  (ZOCOR ) 20 MG tablet Take 1 tablet (20 mg total) by mouth daily. 12/03/22   Allana Arabia, MD  torsemide  (DEMADEX ) 20 MG tablet Take 4 tablets (80 mg total) by mouth daily. 10/08/23   Cleven Dallas, DO  Vitamin D , Ergocalciferol , (DRISDOL ) 1.25 MG (50000 UNIT) CAPS capsule Take 1 capsule (50,000 Units total) by mouth every 7 (seven) days. 10/11/23   Cleven Dallas, DO  warfarin (COUMADIN ) 5 MG tablet Take 1 tablet (5 mg total) by mouth daily at 4 PM. 10/08/23   Cleven Dallas, DO    Discontinued Meds:   Medications Discontinued During This Encounter  Medication Reason   warfarin (COUMADIN ) tablet 5 mg     Social History:  reports that she has been smoking cigarettes. She has a 3 pack-year smoking history. She has never used smokeless tobacco. She reports current alcohol use  of about 3.0 standard drinks of alcohol per week. She reports that she does not use drugs.  Family History:   Family History  Problem Relation Age of Onset   Hypertension Mother    Cancer Father    Hypertension Father    Breast cancer Maternal Aunt 50   Heart attack Other     Blood pressure (!) 83/53, pulse (!) 50, temperature 97.6 F (36.4 C), temperature source Oral, resp. rate 15, weight 68 kg, SpO2 97%. General  appearance: no distress, slowed mentation, and arousable and answers simple questions Head: Normocephalic, without obvious abnormality, atraumatic Eyes: negative Neck: no adenopathy, no carotid bruit, supple, symmetrical, trachea midline, and thyroid  not enlarged, symmetric, no tenderness/mass/nodules Back: symmetric, no curvature. ROM normal. No CVA tenderness. Cardio: bradycardic GI: soft, non-tender; bowel sounds normal; no masses,  no organomegaly Extremities: edema none Pulses: 2+ and symmetric Skin: Skin color, texture, turgor normal. No rashes or lesions       Isacc Turney, Alveda Aures, MD 10/19/2023, 8:09 AM

## 2023-10-19 NOTE — Procedures (Signed)
 Arterial Catheter Insertion Procedure Note  Kim Anthony  [id number]  1951/02/19  Date:10/19/23  Time:6:43 PM    Provider Performing: Armstead Bertrand    Procedure: Insertion of Arterial Line (40981) with US  guidance (19147)   Indication(s) Blood pressure monitoring and/or need for frequent ABGs  Consent Unable to obtain consent due to emergent nature of procedure.  Anesthesia Topical lidocane   Time Out Verified patient identification, verified procedure, site/side was marked, verified correct patient position, special equipment/implants available, medications/allergies/relevant history reviewed, required imaging and test results available.   Sterile Technique Maximal sterile technique including full sterile barrier drape, hand hygiene, sterile gown, sterile gloves, mask, hair covering, sterile ultrasound probe cover (if used).   Procedure Description Area of catheter insertion was cleaned with chlorhexidine  and draped in sterile fashion. With real-time ultrasound guidance an arterial catheter was placed into the right  axillary  artery.  Appropriate arterial tracings confirmed on monitor.     Complications/Tolerancea None; patient tolerated the procedure well.   EBL Minimal   Specimen(s) None

## 2023-10-20 DIAGNOSIS — N1832 Chronic kidney disease, stage 3b: Secondary | ICD-10-CM

## 2023-10-20 DIAGNOSIS — I959 Hypotension, unspecified: Secondary | ICD-10-CM | POA: Diagnosis not present

## 2023-10-20 DIAGNOSIS — G9341 Metabolic encephalopathy: Secondary | ICD-10-CM

## 2023-10-20 DIAGNOSIS — J9611 Chronic respiratory failure with hypoxia: Secondary | ICD-10-CM

## 2023-10-20 DIAGNOSIS — N179 Acute kidney failure, unspecified: Secondary | ICD-10-CM | POA: Diagnosis not present

## 2023-10-20 DIAGNOSIS — Z7901 Long term (current) use of anticoagulants: Secondary | ICD-10-CM

## 2023-10-20 DIAGNOSIS — J449 Chronic obstructive pulmonary disease, unspecified: Secondary | ICD-10-CM

## 2023-10-20 DIAGNOSIS — I4891 Unspecified atrial fibrillation: Secondary | ICD-10-CM | POA: Diagnosis not present

## 2023-10-20 LAB — RENAL FUNCTION PANEL
Albumin: 2.9 g/dL — ABNORMAL LOW (ref 3.5–5.0)
Albumin: 2.6 g/dL — ABNORMAL LOW (ref 3.5–5.0)
Anion gap: 9 (ref 5–15)
Anion gap: 8 (ref 5–15)
BUN: 45 mg/dL — ABNORMAL HIGH (ref 8–23)
BUN: 23 mg/dL (ref 8–23)
CO2: 22 mmol/L (ref 22–32)
CO2: 23 mmol/L (ref 22–32)
Calcium: 9.5 mg/dL (ref 8.9–10.3)
Calcium: 8.7 mg/dL — ABNORMAL LOW (ref 8.9–10.3)
Chloride: 102 mmol/L (ref 98–111)
Chloride: 103 mmol/L (ref 98–111)
Creatinine, Ser: 1.75 mg/dL — ABNORMAL HIGH (ref 0.44–1.00)
Creatinine, Ser: 1.1 mg/dL — ABNORMAL HIGH (ref 0.44–1.00)
GFR, Estimated: 30 mL/min — ABNORMAL LOW (ref >60)
GFR, Estimated: 53 mL/min — ABNORMAL LOW (ref >60)
Glucose, Bld: 107 mg/dL — ABNORMAL HIGH (ref 70–99)
Glucose, Bld: 110 mg/dL — ABNORMAL HIGH (ref 70–99)
Phosphorus: 3.3 mg/dL (ref 2.5–4.6)
Phosphorus: 2.1 mg/dL — ABNORMAL LOW (ref 2.5–4.6)
Potassium: 4.2 mmol/L (ref 3.5–5.1)
Potassium: 3.9 mmol/L (ref 3.5–5.1)
Sodium: 133 mmol/L — ABNORMAL LOW (ref 135–145)
Sodium: 134 mmol/L — ABNORMAL LOW (ref 135–145)

## 2023-10-20 LAB — GLUCOSE, CAPILLARY
Glucose-Capillary: 113 mg/dL — ABNORMAL HIGH (ref 70–99)
Glucose-Capillary: 107 mg/dL — ABNORMAL HIGH (ref 70–99)
Glucose-Capillary: 109 mg/dL — ABNORMAL HIGH (ref 70–99)
Glucose-Capillary: 113 mg/dL — ABNORMAL HIGH (ref 70–99)
Glucose-Capillary: 127 mg/dL — ABNORMAL HIGH (ref 70–99)
Glucose-Capillary: 95 mg/dL (ref 70–99)

## 2023-10-20 LAB — PROTIME-INR
INR: 4.7 (ref 0.8–1.2)
Prothrombin Time: 44.8 s — ABNORMAL HIGH (ref 11.4–15.2)

## 2023-10-20 LAB — MAGNESIUM: Magnesium: 2 mg/dL (ref 1.7–2.4)

## 2023-10-20 LAB — CBC
HCT: 33.7 % — ABNORMAL LOW (ref 36.0–46.0)
Hemoglobin: 10.1 g/dL — ABNORMAL LOW (ref 12.0–15.0)
MCH: 27 pg (ref 26.0–34.0)
MCHC: 30 g/dL (ref 30.0–36.0)
MCV: 90.1 fL (ref 80.0–100.0)
Platelets: 448 10*3/uL — ABNORMAL HIGH (ref 150–400)
RBC: 3.74 MIL/uL — ABNORMAL LOW (ref 3.87–5.11)
RDW: 20.5 % — ABNORMAL HIGH (ref 11.5–15.5)
WBC: 15.1 10*3/uL — ABNORMAL HIGH (ref 4.0–10.5)
nRBC: 0.2 % (ref 0.0–0.2)

## 2023-10-20 LAB — HEPATITIS B SURFACE ANTIBODY, QUANTITATIVE: Hep B S AB Quant (Post): <3.5 m[IU]/mL — ABNORMAL LOW

## 2023-10-20 MED ORDER — RENA-VITE PO TABS
1.0000 | ORAL_TABLET | Freq: Every day | ORAL | Status: DC
  Administered 2023-10-20 – 2023-10-25 (×6): 1 via ORAL
  Filled 2023-10-20 (×6): qty 1

## 2023-10-20 MED ORDER — IPRATROPIUM-ALBUTEROL 0.5-2.5 (3) MG/3ML IN SOLN: 3.0000 mL | Freq: Four times a day (QID) | RESPIRATORY_TRACT | Status: DC

## 2023-10-20 MED ORDER — BUDESONIDE 0.5 MG/2ML IN SUSP
0.5000 mg | Freq: Two times a day (BID) | RESPIRATORY_TRACT | Status: DC
  Administered 2023-10-20 – 2023-10-26 (×11): 0.5 mg via RESPIRATORY_TRACT
  Filled 2023-10-20 (×12): qty 2

## 2023-10-20 MED ORDER — PRISMASOL BGK 4/2.5 32-4-2.5 MEQ/L EC SOLN: Status: DC

## 2023-10-20 MED ORDER — ENSURE ENLIVE PO LIQD
237.0000 mL | Freq: Two times a day (BID) | ORAL | Status: DC
  Administered 2023-10-20 – 2023-10-26 (×8): 237 mL via ORAL

## 2023-10-20 MED ORDER — ARFORMOTEROL TARTRATE 15 MCG/2ML IN NEBU
15.0000 ug | INHALATION_SOLUTION | Freq: Two times a day (BID) | RESPIRATORY_TRACT | Status: DC
  Administered 2023-10-20 – 2023-10-26 (×11): 15 ug via RESPIRATORY_TRACT
  Filled 2023-10-20 (×12): qty 2

## 2023-10-20 MED ORDER — FAMOTIDINE IN NACL 20-0.9 MG/50ML-% IV SOLN
20.0000 mg | INTRAVENOUS | Status: DC
  Administered 2023-10-20: 20 mg via INTRAVENOUS
  Filled 2023-10-20: qty 50

## 2023-10-20 MED ORDER — REVEFENACIN 175 MCG/3ML IN SOLN
175.0000 ug | Freq: Every day | RESPIRATORY_TRACT | Status: DC
  Administered 2023-10-22 – 2023-10-26 (×5): 175 ug via RESPIRATORY_TRACT
  Filled 2023-10-20 (×6): qty 3

## 2023-10-20 MED ORDER — INSULIN ASPART 100 UNIT/ML IJ SOLN
1.0000 [IU] | INTRAMUSCULAR | Status: DC
  Administered 2023-10-20 – 2023-10-24 (×2): 1 [IU] via SUBCUTANEOUS

## 2023-10-20 MED ORDER — MELATONIN 3 MG PO TABS
3.0000 mg | ORAL_TABLET | Freq: Once | ORAL | Status: AC
  Administered 2023-10-20: 3 mg via ORAL
  Filled 2023-10-20 (×2): qty 1

## 2023-10-20 MED ORDER — PROSOURCE PLUS PO LIQD
30.0000 mL | Freq: Three times a day (TID) | ORAL | Status: DC
  Administered 2023-10-20 – 2023-10-26 (×8): 30 mL via ORAL
  Filled 2023-10-20 (×10): qty 30

## 2023-10-20 NOTE — Progress Notes (Signed)
 Kim Anthony is an 73 y.o. female COPD, MVR on coumadin , atrial fibrillation, chronic constipation on chronic opioids, s/p lt mastectomy w breast cancer, CKD3b (BL 1.4-1.9)recently admitted with heart failure, pulmonary HTN with severely reduced RV function diuresed. H/O poor compliance with diet and diuretics, on home torsemide  80mg  daily and at time of discharge her Cr was 1.92. Now p/w SOB, somnolence, AMS, neck pain.   Assessment/Plan:  AKI on CKD3b (BL 1.4-1.9) discharged recently with a cr of 1.92 in the setting previously with severe right sided heart failure. Hyperkalemia treated medically and at time of consultation she had received Lokelma 10gm x1. CXR showed cardiomegaly but no vascular congestion.- Hold losartan , torsemide , farxiga . - Seen on CRRT (started 4/29) Change to 4K bath Appreciate CCM placing the RIJ temp    AMS possibly from sepsis with leukocytosis but certainly uremia could be contributing as well. Being treated with broad spectrum abx. Combined CHF with elevated BNP and h/o noncompliance and discharged on torsemide  80mg  daily from recent hospitalization.  Atrial fibrillation  on coumadin , subtherapeutic at time of discharge recently but now INR 3.3.  HTN COPD  Subjective: Cooperative but at times angry, asking when she's going home. Denies f/c/n/v.    Chemistry and CBC: Creatinine, Ser  Date/Time Value Ref Range Status  10/20/2023 02:29 AM 1.75 (H) 0.44 - 1.00 mg/dL Final    Comment:    DELTA CHECK NOTED  10/19/2023 03:30 PM 3.88 (H) 0.44 - 1.00 mg/dL Final  16/03/9603 54:09 AM 4.07 (H) 0.44 - 1.00 mg/dL Final  81/19/1478 29:56 AM 4.10 (H) 0.44 - 1.00 mg/dL Final  21/30/8657 84:69 AM 4.04 (H) 0.44 - 1.00 mg/dL Final  62/95/2841 32:44 PM 1.85 (H) 0.57 - 1.00 mg/dL Final  06/24/7251 66:44 AM 1.92 (H) 0.44 - 1.00 mg/dL Final  03/47/4259 56:38 PM 2.13 (H) 0.44 - 1.00 mg/dL Final  75/64/3329 51:88 AM 2.33 (H) 0.44 - 1.00 mg/dL Final  41/66/0630 16:01 AM 1.73 (H)  0.44 - 1.00 mg/dL Final  09/32/3557 32:20 AM 1.47 (H) 0.44 - 1.00 mg/dL Final  25/42/7062 37:62 AM 1.43 (H) 0.44 - 1.00 mg/dL Final  83/15/1761 60:73 AM 1.39 (H) 0.44 - 1.00 mg/dL Final  71/11/2692 85:46 AM 1.97 (H) 0.44 - 1.00 mg/dL Final  27/08/5007 38:18 AM 2.04 (H) 0.44 - 1.00 mg/dL Final  29/93/7169 67:89 AM 1.91 (H) 0.44 - 1.00 mg/dL Final  38/03/1750 02:58 AM 1.94 (H) 0.44 - 1.00 mg/dL Final  52/77/8242 35:36 AM 2.64 (H) 0.44 - 1.00 mg/dL Final  14/43/1540 08:67 AM 2.70 (H) 0.44 - 1.00 mg/dL Final  61/95/0932 67:12 PM 2.67 (H) 0.44 - 1.00 mg/dL Final  45/80/9983 38:25 AM 2.23 (H) 0.44 - 1.00 mg/dL Final  05/39/7673 41:93 AM 1.46 (H) 0.44 - 1.00 mg/dL Final  79/07/4095 35:32 AM 1.85 (H) 0.44 - 1.00 mg/dL Final  99/24/2683 41:96 AM 1.66 (H) 0.44 - 1.00 mg/dL Final  22/29/7989 21:19 AM 1.92 (H) 0.44 - 1.00 mg/dL Final  41/74/0814 48:18 AM 1.76 (H) 0.44 - 1.00 mg/dL Final  56/31/4970 26:37 AM 1.92 (H) 0.44 - 1.00 mg/dL Final  85/88/5027 74:12 AM 1.88 (H) 0.44 - 1.00 mg/dL Final  87/86/7672 09:47 AM 1.41 (H) 0.44 - 1.00 mg/dL Final  09/62/8366 29:47 AM 1.46 (H) 0.44 - 1.00 mg/dL Final  65/46/5035 46:56 PM 1.45 (H) 0.44 - 1.00 mg/dL Final  81/27/5170 01:74 AM 1.32 (H) 0.44 - 1.00 mg/dL Final  94/49/6759 16:38 PM 1.39 (H) 0.44 - 1.00 mg/dL Final  46/65/9935 70:17 AM  1.23 (H) 0.44 - 1.00 mg/dL Final  21/30/8657 84:69 PM 1.40 (H) 0.44 - 1.00 mg/dL Final  62/95/2841 32:44 AM 1.32 (H) 0.44 - 1.00 mg/dL Final  06/24/7251 66:44 AM 1.30 (H) 0.44 - 1.00 mg/dL Final  03/47/4259 56:38 AM 1.05 (H) 0.44 - 1.00 mg/dL Final  75/64/3329 51:88 PM 0.87 0.44 - 1.00 mg/dL Final  41/66/0630 16:01 AM 0.85 0.44 - 1.00 mg/dL Final  09/32/3557 32:20 PM 0.97 0.44 - 1.00 mg/dL Final  25/42/7062 37:62 PM 0.93 0.44 - 1.00 mg/dL Final  83/15/1761 60:73 AM 1.02 (H) 0.44 - 1.00 mg/dL Final  71/11/2692 85:46 PM 1.18 (H) 0.44 - 1.00 mg/dL Final  27/08/5007 38:18 AM 1.47 (H) 0.44 - 1.00 mg/dL Final  29/93/7169  67:89 AM 2.17 (H) 0.44 - 1.00 mg/dL Final  38/03/1750 02:58 PM 2.81 (H) 0.44 - 1.00 mg/dL Final  52/77/8242 35:36 AM 3.12 (H) 0.44 - 1.00 mg/dL Final  14/43/1540 08:67 AM 4.27 (H) 0.44 - 1.00 mg/dL Final  61/95/0932 67:12 AM 4.34 (H) 0.44 - 1.00 mg/dL Final  45/80/9983 38:25 PM 4.14 (H) 0.44 - 1.00 mg/dL Final  05/39/7673 41:93 PM 2.10 (H) 0.44 - 1.00 mg/dL Final   Recent Labs  Lab 10/13/23 1549 10/19/23 0115 10/19/23 0122 10/19/23 0523 10/19/23 0600 10/19/23 0605 10/19/23 1530 10/20/23 0229  NA 134 129* 126* 128* 130* 129* 131* 133*  K 5.0 5.8* 5.6* 6.1* 5.7* 6.5* 5.2* 4.2  CL 97 98 103 100  --   --  100 102  CO2 18* 18*  --  17*  --   --  19* 22  GLUCOSE 94 88 86 90  --   --  87 107*  BUN 70* 104* 111* 102*  --   --  102* 45*  CREATININE 1.85* 4.04* 4.10* 4.07*  --   --  3.88* 1.75*  CALCIUM  10.0 9.1  --  8.9  --   --  8.8* 9.5  PHOS  --   --   --  7.9*  --   --  7.9* 3.3   Recent Labs  Lab 10/18/23 2342 10/19/23 0122 10/19/23 0530 10/19/23 0600 10/19/23 0605 10/20/23 0229  WBC 19.7*  --  17.5*  --   --  15.1*  HGB 10.3*   < > 10.7* 11.2* 11.2* 10.1*  HCT 38.3   < > 36.7 33.0* 33.0* 33.7*  MCV 99.2  --  92.4  --   --  90.1  PLT 417*  --  443*  --   --  448*   < > = values in this interval not displayed.   Liver Function Tests: Recent Labs  Lab 10/19/23 0523 10/19/23 1530 10/20/23 0229  AST 18  --   --   ALT 20  --   --   ALKPHOS 108  --   --   BILITOT 0.8  --   --   PROT 6.9  --   --   ALBUMIN 3.5  3.5 3.0* 2.9*   No results for input(s): "LIPASE", "AMYLASE" in the last 168 hours. No results for input(s): "AMMONIA" in the last 168 hours. Cardiac Enzymes: Recent Labs  Lab 10/19/23 0632  CKTOTAL 78   Iron  Studies: No results for input(s): "IRON ", "TIBC", "TRANSFERRIN", "FERRITIN" in the last 72 hours. PT/INR: @LABRCNTIP (inr:5)  Xrays/Other Studies: ) Results for orders placed or performed during the hospital encounter of 10/18/23 (from the past  48 hours)  CBC     Status: Abnormal   Collection Time: 10/18/23  11:42 PM  Result Value Ref Range   WBC 19.7 (H) 4.0 - 10.5 K/uL   RBC 3.86 (L) 3.87 - 5.11 MIL/uL   Hemoglobin 10.3 (L) 12.0 - 15.0 g/dL   HCT 16.1 09.6 - 04.5 %   MCV 99.2 80.0 - 100.0 fL   MCH 26.7 26.0 - 34.0 pg   MCHC 26.9 (L) 30.0 - 36.0 g/dL   RDW 40.9 (H) 81.1 - 91.4 %   Platelets 417 (H) 150 - 400 K/uL   nRBC 0.0 0.0 - 0.2 %    Comment: Performed at [facility], [address], [place], [place] [address]  Brain natriuretic peptide     Status: Abnormal   Collection Time: 10/19/23  1:15 AM  Result Value Ref Range   B Natriuretic Peptide 911.0 (H) 0.0 - 100.0 pg/mL    Comment: Performed at [facility], [address] [place], [place] [address]  Troponin I (High Sensitivity)     Status: Abnormal   Collection Time: 10/19/23  1:15 AM  Result Value Ref Range   Troponin I (High Sensitivity) 18 (H) <18 ng/L    Comment: (NOTE) Elevated high sensitivity troponin I (hsTnI) values and significant  changes across serial measurements may suggest ACS but many other  chronic and acute conditions are known to elevate hsTnI results.  Refer to the "Links" section for chest pain algorithms and additional  guidance. Performed at [facility], [address], [place], [place] [address]   Basic metabolic panel with GFR     Status: Abnormal   Collection Time: 10/19/23  1:15 AM  Result Value Ref Range   Sodium 129 (L) 135 - 145 mmol/L   Potassium 5.8 (H) 3.5 - 5.1 mmol/L   Chloride 98 98 - 111 mmol/L   CO2 18 (L) 22 - 32 mmol/L   Glucose, Bld 88 70 - 99 mg/dL    Comment: Glucose reference range applies only to samples taken after fasting for at least 8 hours.   BUN 104 (H) 8 - 23 mg/dL   Creatinine, Ser 4.69 (H) 0.44 - 1.00 mg/dL   Calcium  9.1 8.9 - 10.3 mg/dL   GFR, Estimated 11 (L) >60 mL/min    Comment: (NOTE) Calculated using the CKD-EPI Creatinine Equation (2021)    Anion gap 13 5 - 15     Comment: Performed at [facility], [address] [place], [place] [address]  Protime-INR     Status: Abnormal   Collection Time: 10/19/23  1:15 AM  Result Value Ref Range   Prothrombin  Time 33.4 (H) 11.4 - 15.2 seconds   INR 3.3 (H) 0.8 - 1.2    Comment: (NOTE) INR goal varies based on device and disease states. Performed at [facility], [address], [place], [place] [address]   I-stat chem 8, ED (not at [facility] or [facility])     Status: Abnormal   Collection Time: 10/19/23  1:22 AM  Result Value Ref Range   Sodium 126 (L) 135 - 145 mmol/L   Potassium 5.6 (H) 3.5 - 5.1 mmol/L   Chloride 103 98 - 111 mmol/L   BUN 111 (H) 8 - 23 mg/dL   Creatinine, Ser 4.40 (H) 0.44 - 1.00 mg/dL   Glucose, Bld 86 70 - 99 mg/dL    Comment: Glucose reference range applies only to samples taken after fasting for at least 8 hours.   Calcium , Ion 0.93 (L) 1.15 - 1.40  mmol/L   TCO2 16 (L) 22 - 32 mmol/L   Hemoglobin 12.9 12.0 - 15.0 g/dL   HCT 13.0 86.5 - 78.4 %  Troponin I (High Sensitivity)     Status: None   Collection Time: 10/19/23  3:25 AM  Result Value Ref Range   Troponin I (High Sensitivity) 17 <18 ng/L    Comment: (NOTE) Elevated high sensitivity troponin I (hsTnI) values and significant  changes across serial measurements may suggest ACS but many other  chronic and acute conditions are known to elevate hsTnI results.  Refer to the "Links" section for chest pain algorithms and additional  guidance. Performed at [facility], [address] [place], [place] [address]   Urinalysis, Routine w reflex microscopic -Urine, Clean Catch     Status: Abnormal   Collection Time: 10/19/23  5:00 AM  Result Value Ref Range   Color, Urine YELLOW YELLOW   APPearance HAZY (A) CLEAR   Specific Gravity, Urine 1.017 1.005 - 1.030   pH 5.0 5.0 - 8.0   Glucose, UA NEGATIVE NEGATIVE mg/dL   Hgb urine dipstick NEGATIVE NEGATIVE   Bilirubin Urine NEGATIVE NEGATIVE   Ketones, ur NEGATIVE  NEGATIVE mg/dL   Protein, ur 30 (A) NEGATIVE mg/dL   Nitrite NEGATIVE NEGATIVE   Leukocytes,Ua NEGATIVE NEGATIVE   RBC / HPF 0-5 0 - 5 RBC/hpf   WBC, UA 0-5 0 - 5 WBC/hpf   Bacteria, UA RARE (A) NONE SEEN   Squamous Epithelial / HPF 0-5 0 - 5 /HPF   Mucus PRESENT    Hyaline Casts, UA PRESENT     Comment: Performed at [facility], [address], [place], [place] [address]  Rapid urine drug screen (hospital performed)     Status: Abnormal   Collection Time: 10/19/23  5:06 AM  Result Value Ref Range   Opiates POSITIVE (A) NONE DETECTED   Cocaine NONE DETECTED NONE DETECTED   Benzodiazepines NONE DETECTED NONE DETECTED   Amphetamines NONE DETECTED NONE DETECTED   Tetrahydrocannabinol NONE DETECTED NONE DETECTED   Barbiturates NONE DETECTED NONE DETECTED    Comment: (NOTE) DRUG SCREEN FOR MEDICAL PURPOSES ONLY.  IF CONFIRMATION IS NEEDED FOR ANY PURPOSE, NOTIFY LAB WITHIN 5 DAYS.  LOWEST DETECTABLE LIMITS FOR URINE DRUG SCREEN Drug Class                     Cutoff (ng/mL) Amphetamine and metabolites    1000 Barbiturate and metabolites    200 Benzodiazepine                 200 Opiates and metabolites        300 Cocaine and metabolites        300 THC                            50 Performed at [facility], [address], [place], [place] [address]   Lactic acid, plasma     Status: None   Collection Time: 10/19/23  5:23 AM  Result Value Ref Range   Lactic Acid, Venous 0.9 0.5 - 1.9 mmol/L    Comment: Performed at [facility], [address] [place], [place] [address]  Renal function panel     Status: Abnormal   Collection Time: 10/19/23  5:23 AM  Result Value Ref Range   Sodium 128 (L) 135 - 145 mmol/L   Potassium 6.1 (H)  3.5 - 5.1 mmol/L   Chloride 100 98 - 111 mmol/L   CO2 17 (L) 22 - 32 mmol/L   Glucose, Bld 90 70 - 99 mg/dL    Comment: Glucose reference range applies only to samples taken after fasting for at least 8 hours.   BUN 102 (H) 8 - 23  mg/dL   Creatinine, Ser 1.47 (H) 0.44 - 1.00 mg/dL   Calcium  8.9 8.9 - 10.3 mg/dL   Phosphorus  7.9 (H) 2.5 - 4.6 mg/dL   Albumin 3.5 3.5 - 5.0 g/dL   GFR, Estimated 11 (L) >60 mL/min    Comment: (NOTE) Calculated using the CKD-EPI Creatinine Equation (2021)    Anion gap 11 5 - 15    Comment: Performed at [facility], [address], [place], [place] [address]  Respiratory (~20 pathogens) panel by PCR     Status: None   Collection Time: 10/19/23  5:23 AM   Specimen: Nasopharyngeal Swab; Respiratory  Result Value Ref Range   Adenovirus NOT DETECTED NOT DETECTED   Coronavirus 229E NOT DETECTED NOT DETECTED    Comment: (NOTE) The Coronavirus on the Respiratory Panel, DOES NOT test for the novel  Coronavirus (2019 nCoV)    Coronavirus HKU1 NOT DETECTED NOT DETECTED   Coronavirus NL63 NOT DETECTED NOT DETECTED   Coronavirus OC43 NOT DETECTED NOT DETECTED   Metapneumovirus NOT DETECTED NOT DETECTED   Rhinovirus / Enterovirus NOT DETECTED NOT DETECTED   Influenza A NOT DETECTED NOT DETECTED   Influenza B NOT DETECTED NOT DETECTED   Parainfluenza Virus 1 NOT DETECTED NOT DETECTED   Parainfluenza Virus 2 NOT DETECTED NOT DETECTED   Parainfluenza Virus 3 NOT DETECTED NOT DETECTED   Parainfluenza Virus 4 NOT DETECTED NOT DETECTED   Respiratory Syncytial Virus NOT DETECTED NOT DETECTED   Bordetella pertussis NOT DETECTED NOT DETECTED   Bordetella Parapertussis NOT DETECTED NOT DETECTED   Chlamydophila pneumoniae NOT DETECTED NOT DETECTED   Mycoplasma pneumoniae NOT DETECTED NOT DETECTED    Comment: Performed at [facility], [address] [place], [place] [address]  Resp panel by RT-PCR (RSV, Flu A&B, Covid) Nasopharyngeal Swab     Status: None   Collection Time: 10/19/23  5:23 AM   Specimen: Nasopharyngeal Swab; Nasal Swab  Result Value Ref Range   SARS Coronavirus 2 by RT PCR NEGATIVE NEGATIVE   Influenza A by PCR NEGATIVE NEGATIVE   Influenza B by PCR NEGATIVE  NEGATIVE    Comment: (NOTE) The Xpert Xpress SARS-CoV-2/FLU/RSV plus assay is intended as an aid in the diagnosis of influenza from Nasopharyngeal swab specimens and should not be used as a sole basis for treatment. Nasal washings and aspirates are unacceptable for Xpert Xpress SARS-CoV-2/FLU/RSV testing.  Fact Sheet for Patients: BloggerCourse.com  Fact Sheet for Healthcare Providers: SeriousBroker.it  This test is not yet approved or cleared by the [place]  FDA and has been authorized for detection and/or diagnosis of SARS-CoV-2 by FDA under an Emergency Use Authorization (EUA). This EUA will remain in effect (meaning this test can be used) for the duration of the COVID-19 declaration under Section 564(b)(1) of the Act, 21 U.S.C. section 360bbb-3(b)(1), unless the authorization is terminated or revoked.     Resp Syncytial Virus by PCR NEGATIVE NEGATIVE    Comment: (NOTE) Fact Sheet for Patients: BloggerCourse.com  Fact Sheet for Healthcare Providers: SeriousBroker.it  This test is not yet approved or cleared by the [place]  FDA and has been authorized for detection and/or  diagnosis of SARS-CoV-2 by FDA under an Emergency Use Authorization (EUA). This EUA will remain in effect (meaning this test can be used) for the duration of the COVID-19 declaration under Section 564(b)(1) of the Act, 21 U.S.C. section 360bbb-3(b)(1), unless the authorization is terminated or revoked.  Performed at [facility], [address], [place], [place] [address]   TSH     Status: None   Collection Time: 10/19/23  5:23 AM  Result Value Ref Range   TSH 2.622 0.350 - 4.500 uIU/mL    Comment: Performed by a 3rd Generation assay with a functional sensitivity of <=0.01 uIU/mL. Performed at [facility], [address] [place], [place] [address]   Magnesium      Status:  Abnormal   Collection Time: 10/19/23  5:23 AM  Result Value Ref Range   Magnesium  2.8 (H) 1.7 - 2.4 mg/dL    Comment: Performed at [facility], [address] [place], [place] [address]  Hepatic function panel     Status: Abnormal   Collection Time: 10/19/23  5:23 AM  Result Value Ref Range   Total Protein 6.9 6.5 - 8.1 g/dL   Albumin 3.5 3.5 - 5.0 g/dL   AST 18 15 - 41 U/L   ALT 20 0 - 44 U/L   Alkaline Phosphatase 108 38 - 126 U/L   Total Bilirubin 0.8 0.0 - 1.2 mg/dL   Bilirubin, Direct 0.3 (H) 0.0 - 0.2 mg/dL   Indirect Bilirubin 0.5 0.3 - 0.9 mg/dL    Comment: Performed at [facility], [address], [place], [place] [address]  CBC     Status: Abnormal   Collection Time: 10/19/23  5:30 AM  Result Value Ref Range   WBC 17.5 (H) 4.0 - 10.5 K/uL   RBC 3.97 3.87 - 5.11 MIL/uL   Hemoglobin 10.7 (L) 12.0 - 15.0 g/dL   HCT 29.5 62.1 - 30.8 %   MCV 92.4 80.0 - 100.0 fL   MCH 27.0 26.0 - 34.0 pg   MCHC 29.2 (L) 30.0 - 36.0 g/dL   RDW 65.7 (H) 84.6 - 96.2 %   Platelets 443 (H) 150 - 400 K/uL   nRBC 0.2 0.0 - 0.2 %    Comment: Performed at [facility], [address] [place], [place] [address]  Culture, blood (Routine X 2) w Reflex to ID Panel     Status: None (Preliminary result)   Collection Time: 10/19/23  5:45 AM   Specimen: BLOOD  Result Value Ref Range   Specimen Description BLOOD RIGHT ANTECUBITAL    Special Requests      BOTTLES DRAWN AEROBIC AND ANAEROBIC Blood Culture results may not be optimal due to an inadequate volume of blood received in culture bottles   Culture      NO GROWTH 1 DAY Performed at [facility], [address], [place], [place] [address]    Report Status PENDING   Culture, blood (Routine X 2) w Reflex to ID Panel     Status: None (Preliminary result)   Collection Time: 10/19/23  5:45 AM   Specimen: BLOOD RIGHT HAND  Result Value Ref Range   Specimen Description BLOOD RIGHT HAND    Special Requests      BOTTLES  DRAWN AEROBIC AND ANAEROBIC Blood Culture results may not be optimal due to an inadequate volume of blood received in culture bottles   Culture      NO GROWTH  1 DAY Performed at [facility], [address] [place], [place] [address]    Report Status PENDING   I-Stat venous blood gas, ED     Status: Abnormal   Collection Time: 10/19/23  6:00 AM  Result Value Ref Range   pH, Ven 7.173 (LL) 7.25 - 7.43   pCO2, Ven 51.2 44 - 60 mmHg   pO2, Ven 30 (LL) 32 - 45 mmHg   Bicarbonate 18.8 (L) 20.0 - 28.0 mmol/L   TCO2 20 (L) 22 - 32 mmol/L   O2 Saturation 42 %   Acid-base deficit 10.0 (H) 0.0 - 2.0 mmol/L   Sodium 130 (L) 135 - 145 mmol/L   Potassium 5.7 (H) 3.5 - 5.1 mmol/L   Calcium , Ion 1.14 (L) 1.15 - 1.40 mmol/L   HCT 33.0 (L) 36.0 - 46.0 %   Hemoglobin 11.2 (L) 12.0 - 15.0 g/dL   Sample type VENOUS    Comment NOTIFIED PHYSICIAN   I-Stat venous blood gas, ED     Status: Abnormal   Collection Time: 10/19/23  6:05 AM  Result Value Ref Range   pH, Ven 7.188 (LL) 7.25 - 7.43   pCO2, Ven 51.0 44 - 60 mmHg   pO2, Ven 47 (H) 32 - 45 mmHg   Bicarbonate 19.4 (L) 20.0 - 28.0 mmol/L   TCO2 21 (L) 22 - 32 mmol/L   O2 Saturation 72 %   Acid-base deficit 9.0 (H) 0.0 - 2.0 mmol/L   Sodium 129 (L) 135 - 145 mmol/L   Potassium 6.5 (HH) 3.5 - 5.1 mmol/L   Calcium , Ion 1.12 (L) 1.15 - 1.40 mmol/L   HCT 33.0 (L) 36.0 - 46.0 %   Hemoglobin 11.2 (L) 12.0 - 15.0 g/dL   Sample type VENOUS    Comment NOTIFIED PHYSICIAN   CK     Status: None   Collection Time: 10/19/23  6:32 AM  Result Value Ref Range   Total CK 78 38 - 234 U/L    Comment: Performed at [facility], [address], [place], [place] [address]  CBG monitoring, ED     Status: Abnormal   Collection Time: 10/19/23  6:45 AM  Result Value Ref Range   Glucose-Capillary 61 (L) 70 - 99 mg/dL    Comment: Glucose reference range applies only to samples taken after fasting for at least 8 hours.  CBG monitoring, ED     Status:  Abnormal   Collection Time: 10/19/23  7:34 AM  Result Value Ref Range   Glucose-Capillary 213 (H) 70 - 99 mg/dL    Comment: Glucose reference range applies only to samples taken after fasting for at least 8 hours.  Hepatitis B surface antigen     Status: None   Collection Time: 10/19/23  8:40 AM  Result Value Ref Range   Hepatitis B Surface Ag NON REACTIVE NON REACTIVE    Comment: Performed at [facility], [address], [place], [place] [address]  Lactic acid, plasma     Status: None   Collection Time: 10/19/23  8:42 AM  Result Value Ref Range   Lactic Acid, Venous 1.0 0.5 - 1.9 mmol/L    Comment: Performed at [facility], [address] [place], [place] [address]  MRSA Next Gen by PCR, Nasal     Status: None   Collection Time: 10/19/23 11:40 AM   Specimen: Nasal Mucosa; Nasal Swab  Result Value Ref Range   MRSA by PCR  Next Gen NOT DETECTED NOT DETECTED    Comment: (NOTE) The GeneXpert MRSA Assay (FDA approved for NASAL specimens only), is one component of a comprehensive MRSA colonization surveillance program. It is not intended to diagnose MRSA infection nor to guide or monitor treatment for MRSA infections. Test performance is not FDA approved in patients less than 49 years old. Performed at [facility], [address] [place], [place] [address]   Glucose, capillary     Status: Abnormal   Collection Time: 10/19/23 11:42 AM  Result Value Ref Range   Glucose-Capillary 34 (LL) 70 - 99 mg/dL    Comment: Glucose reference range applies only to samples taken after fasting for at least 8 hours.   Comment 1 Notify RN   Glucose, capillary     Status: Abnormal   Collection Time: 10/19/23 11:44 AM  Result Value Ref Range   Glucose-Capillary 39 (LL) 70 - 99 mg/dL    Comment: Glucose reference range applies only to samples taken after fasting for at least 8 hours.   Comment 1 Notify RN   Glucose, capillary     Status: None   Collection Time: 10/19/23 12:09 PM   Result Value Ref Range   Glucose-Capillary 89 70 - 99 mg/dL    Comment: Glucose reference range applies only to samples taken after fasting for at least 8 hours.  Glucose, capillary     Status: Abnormal   Collection Time: 10/19/23 12:44 PM  Result Value Ref Range   Glucose-Capillary 107 (H) 70 - 99 mg/dL    Comment: Glucose reference range applies only to samples taken after fasting for at least 8 hours.  Renal function panel (daily at 1600)     Status: Abnormal   Collection Time: 10/19/23  3:30 PM  Result Value Ref Range   Sodium 131 (L) 135 - 145 mmol/L   Potassium 5.2 (H) 3.5 - 5.1 mmol/L   Chloride 100 98 - 111 mmol/L   CO2 19 (L) 22 - 32 mmol/L   Glucose, Bld 87 70 - 99 mg/dL    Comment: Glucose reference range applies only to samples taken after fasting for at least 8 hours.   BUN 102 (H) 8 - 23 mg/dL   Creatinine, Ser 3.08 (H) 0.44 - 1.00 mg/dL   Calcium  8.8 (L) 8.9 - 10.3 mg/dL   Phosphorus  7.9 (H) 2.5 - 4.6 mg/dL   Albumin 3.0 (L) 3.5 - 5.0 g/dL   GFR, Estimated 12 (L) >60 mL/min    Comment: (NOTE) Calculated using the CKD-EPI Creatinine Equation (2021)    Anion gap 12 5 - 15    Comment: Performed at [facility], [address], [place], [place] [address]  Hepatitis B surface antibody,quantitative     Status: Abnormal   Collection Time: 10/19/23  3:30 PM  Result Value Ref Range   Hep B S AB Quant (Post) <3.5 (L) Immunity>10 mIU/mL    Comment: (NOTE)  Status of Immunity                     Anti-HBs Level  ------------------                     -------------- Inconsistent with Immunity                  0.0 - 10.0 Consistent with Immunity                         >  10.0 Performed At: [facility] [address] [place], [place] [id number] Pearlean Botts MD WU:[id number]   Glucose, capillary     Status: None   Collection Time: 10/19/23  3:38 PM  Result Value Ref Range   Glucose-Capillary 80 70 - 99 mg/dL    Comment: Glucose reference range applies only  to samples taken after fasting for at least 8 hours.  Glucose, capillary     Status: Abnormal   Collection Time: 10/19/23  7:20 PM  Result Value Ref Range   Glucose-Capillary 55 (L) 70 - 99 mg/dL    Comment: Glucose reference range applies only to samples taken after fasting for at least 8 hours.  Glucose, capillary     Status: None   Collection Time: 10/19/23  7:22 PM  Result Value Ref Range   Glucose-Capillary 95 70 - 99 mg/dL    Comment: Glucose reference range applies only to samples taken after fasting for at least 8 hours.  Glucose, capillary     Status: Abnormal   Collection Time: 10/19/23 11:29 PM  Result Value Ref Range   Glucose-Capillary 105 (H) 70 - 99 mg/dL    Comment: Glucose reference range applies only to samples taken after fasting for at least 8 hours.  Protime-INR     Status: Abnormal   Collection Time: 10/20/23  2:29 AM  Result Value Ref Range   Prothrombin  Time 44.8 (H) 11.4 - 15.2 seconds   INR 4.7 (HH) 0.8 - 1.2    Comment: REPEATED TO VERIFY CRITICAL RESULT CALLED TO, READ BACK BY AND VERIFIED WITH: Derrick Fling RN @0330  10/20/23 SATRAINR (NOTE) INR goal varies based on device and disease states. Performed at [facility], [address] [place], [place] [address]   Renal function panel (daily at 0500)     Status: Abnormal   Collection Time: 10/20/23  2:29 AM  Result Value Ref Range   Sodium 133 (L) 135 - 145 mmol/L   Potassium 4.2 3.5 - 5.1 mmol/L   Chloride 102 98 - 111 mmol/L   CO2 22 22 - 32 mmol/L   Glucose, Bld 107 (H) 70 - 99 mg/dL    Comment: Glucose reference range applies only to samples taken after fasting for at least 8 hours.   BUN 45 (H) 8 - 23 mg/dL   Creatinine, Ser 3.08 (H) 0.44 - 1.00 mg/dL    Comment: DELTA CHECK NOTED   Calcium  9.5 8.9 - 10.3 mg/dL   Phosphorus  3.3 2.5 - 4.6 mg/dL   Albumin 2.9 (L) 3.5 - 5.0 g/dL   GFR, Estimated 30 (L) >60 mL/min    Comment: (NOTE) Calculated using the CKD-EPI Creatinine Equation  (2021)    Anion gap 9 5 - 15    Comment: Performed at [facility], [address], [place], [place] [address]  Magnesium      Status: None   Collection Time: 10/20/23  2:29 AM  Result Value Ref Range   Magnesium  2.0 1.7 - 2.4 mg/dL    Comment: Performed at [facility], [address] [place], [place] [address]  CBC     Status: Abnormal   Collection Time: 10/20/23  2:29 AM  Result Value Ref Range   WBC 15.1 (H) 4.0 - 10.5 K/uL   RBC 3.74 (L) 3.87 - 5.11 MIL/uL   Hemoglobin 10.1 (L) 12.0 - 15.0 g/dL   HCT 52.8 (L) 41.3 - 24.4 %   MCV 90.1 80.0 - 100.0 fL  MCH 27.0 26.0 - 34.0 pg   MCHC 30.0 30.0 - 36.0 g/dL   RDW 30.8 (H) 65.7 - 84.6 %   Platelets 448 (H) 150 - 400 K/uL   nRBC 0.2 0.0 - 0.2 %    Comment: Performed at [facility], [address], [place], [place] [address]  Glucose, capillary     Status: Abnormal   Collection Time: 10/20/23  3:55 AM  Result Value Ref Range   Glucose-Capillary 113 (H) 70 - 99 mg/dL    Comment: Glucose reference range applies only to samples taken after fasting for at least 8 hours.  Glucose, capillary     Status: Abnormal   Collection Time: 10/20/23  7:23 AM  Result Value Ref Range   Glucose-Capillary 107 (H) 70 - 99 mg/dL    Comment: Glucose reference range applies only to samples taken after fasting for at least 8 hours.   *Note: Due to a large number of results and/or encounters for the requested time period, some results have not been displayed. A complete set of results can be found in Results Review.   DG CHEST PORT 1 VIEW Result Date: 10/19/2023 EXAM: 1 VIEW XRAY OF THE CHEST 10/19/2023 01:17:00 PM COMPARISON: None available. CLINICAL HISTORY: Encounter for central line placement. FINDINGS: LUNGS AND PLEURA: Mildly worsened bibasilar atelectasis. Small right pleural effusion, slightly increased. Mild pulmonary edema, slightly worsened. No pneumothorax. HEART AND MEDIASTINUM: Moderate cardiomegaly, stable. Mitral  annuloplasty being in place. BONES AND SOFT TISSUES: Intact sternotomy wires. Cervical clips overlie the left axilla. No acute osseous abnormality. LINES AND TUBES: Right internal jugular central venous catheter terminates in the lower third of the SVC. IMPRESSION: 1. Right internal jugular central venous catheter terminates in the lower third of the SVC. No pneumothorax. 2. Moderate cardiomegaly, stable. Mild pumonary edema, slightly worsened. 3. Small right pleural effusion, slightly increased. 4. Mildly worsened bibasilar atelectasis. Electronically signed by: [email or web address] 10/19/2023 02:35 PM EDT RP Workstation: [id number]   US  RENAL Result Date: 10/19/2023 CLINICAL DATA:  Acute renal failure EXAM: RENAL / URINARY TRACT ULTRASOUND COMPLETE COMPARISON:  Ultrasound abdomen complete 08/19/2023 FINDINGS: Right Kidney: Renal measurements: 10.5 x 5.2 x 4.7 cm = volume: 134 mL. Mild increased echogenicity of the renal cortex without significant thinning. No mass or hydronephrosis visualized. Left Kidney: Renal measurements: 10.0 x 5.8 x 5.5 cm = volume: 165 mL. Mild increased echogenicity of the renal cortex without significant thinning. No mass or hydronephrosis visualized. Bladder: Appears normal for degree of bladder distention. Other: None. IMPRESSION: Mild increased echogenicity of the renal cortex without significant thinning suggestive of chronic medical renal disease. Electronically Signed   By: Elester Grim M.D.   On: 10/19/2023 09:59   DG Chest 2 View Result Date: 10/18/2023 CLINICAL DATA:  Shortness of breath and neck pain. Hypoxia on arrival. EXAM: CHEST - 2 VIEW COMPARISON:  AP Lat chest series 09/29/23, 09/19/23. FINDINGS: Moderate to severe cardiomegaly again noted with MVR. Intact sternotomy sutures. There are no findings of acute CHF. There is chronic pleural-parenchymal disease left apex, left mid field. No new infiltrate is seen. There is no substantial pleural effusion. The mediastinum is normally  outlined.  Left axillary surgical clips. There is thoracic kyphosis and degenerative change with no new osseous findings. IMPRESSION: 1. No evidence of acute chest disease. 2. Cardiomegaly with chronic pleural-parenchymal disease left apex, left mid field. Electronically Signed   By: Denman Fischer M.D.   On: 10/18/2023 22:17    PMH:  Past Medical History:  Diagnosis Date   Acute delirium 08/30/2023   Acute GI bleeding 08/18/2023   Arthritis    Asthma    Breast cancer (HCC) 2001   Left Breast Cancer   CHF (congestive heart failure) (HCC)    Closed fracture of right hip (HCC) 09/23/2022   Closed intertrochanteric fracture of right femur, initial encounter (HCC) 09/22/2022   COPD (chronic obstructive pulmonary disease) (HCC)    Coronary artery disease    GERD (gastroesophageal reflux disease)    History of mitral valve prosthesis 07/31/2023   History of mitral valve replacement with mechanical valve    HLD (hyperlipidemia)    Hypertension    Personal history of breast cancer    Left Breast Cancer     Pulmonary embolism (HCC) 2021    PSH:   Past Surgical History:  Procedure Laterality Date   APPLICATION OF A-CELL OF CHEST/ABDOMEN Left 01/27/2019   Procedure: APPLICATION OF A-CELL OF CHEST;  Surgeon: Thornell Flirt, DO;  Location: [facility] OR;  Service: Plastics;  Laterality: Left;   APPLICATION OF WOUND VAC N/A 01/27/2019   Procedure: APPLICATION OF WOUND VAC;  Surgeon: Thornell Flirt, DO;  Location: [facility] OR;  Service: Plastics;  Laterality: N/A;   BREAST SURGERY Left 2001   for CA   CARDIOVERSION N/A 02/26/2022   Procedure: CARDIOVERSION;  Surgeon: Wendie Hamburg, MD;  Location: [facility] ENDOSCOPY;  Service: Cardiovascular;  Laterality: N/A;   COLONOSCOPY WITH PROPOFOL  N/A 06/07/2018   Procedure: COLONOSCOPY WITH PROPOFOL ;  Surgeon: Baldo Bonds, MD;  Location: [facility] ENDOSCOPY;  Service: Endoscopy;  Laterality: N/A;   DEBRIDEMENT AND CLOSURE WOUND Left 12/28/2018    Procedure: Debridement And Closure Wound;  Surgeon: Oza Blumenthal, MD;  Location: [facility] OR;  Service: General;  Laterality: Left;   ESOPHAGOGASTRODUODENOSCOPY (EGD) WITH PROPOFOL  N/A 08/24/2023   Procedure: ESOPHAGOGASTRODUODENOSCOPY (EGD) WITH PROPOFOL ;  Surgeon: Renaye Carp, DO;  Location: [facility] ENDOSCOPY;  Service: Gastroenterology;  Laterality: N/A;   INCISION AND DRAINAGE OF WOUND Left 01/27/2019   Procedure: IRRIGATION AND DEBRIDEMENT OF CHEST WALL;  Surgeon: Thornell Flirt, DO;  Location: [facility];  Service: Plastics;  Laterality: Left;   INTRAMEDULLARY (IM) NAIL INTERTROCHANTERIC N/A 09/23/2022   Procedure: INTRAMEDULLARY (IM) NAIL INTERTROCHANTERIC;  Surgeon: Wes Hamman, MD;  Location: [facility] OR;  Service: Orthopedics;  Laterality: N/A;   IR FLUORO GUIDE CV LINE RIGHT  10/01/2023   IR US  GUIDE VASC ACCESS RIGHT  10/01/2023   IR VENO/EXT/UNI RIGHT  10/05/2023   KNEE ARTHROSCOPY WITH MEDIAL MENISECTOMY Left 08/06/2021   Procedure: KNEE ARTHROSCOPY WITH PARTIAL MEDIAL MENISECTOMY, DEBRIDEMENT;  Surgeon: Orvan Blanch, MD;  Location: [facility] ORS;  Service: Orthopedics;  Laterality: Left;  1 HR   LATISSIMUS FLAP TO BREAST Left 01/18/2019   Procedure: LEFT LATISSIMUS FLAP TO BREAST;  Surgeon: Thornell Flirt, DO;  Location: [facility] OR;  Service: Plastics;  Laterality: Left;   MASTECTOMY Left    MITRAL VALVE REPLACEMENT     mechanical   POLYPECTOMY  06/07/2018   Procedure: POLYPECTOMY;  Surgeon: Baldo Bonds, MD;  Location: [facility] ENDOSCOPY;  Service: Endoscopy;;   RIGHT HEART CATH N/A 08/20/2023   Procedure: RIGHT HEART CATH;  Surgeon: Mardell Shade, MD;  [facility] LAB;  Service: Cardiovascular;  Laterality: N/A;   RIGHT/LEFT HEART CATH AND CORONARY ANGIOGRAPHY N/A 01/09/2021   Procedure: RIGHT/LEFT HEART CATH AND CORONARY ANGIOGRAPHY;  Surgeon: Mardell Shade, MD;  [facility];  Service: Cardiovascular;  Laterality: N/A;   WOUND DEBRIDEMENT Left  12/28/2018   Procedure: EXCISION OF LEFT CHRONIC CHEST WALL WOUND;  Surgeon: Oza Blumenthal, MD;  Location: [facility] OR;  Service: General;  Laterality: Left;    Allergies:  Allergies  Allergen Reactions   Lisinopril  Swelling   Acetaminophen -Codeine Itching   Propoxyphene Itching    Darvocet   Tape Rash    Plastic   Tramadol  Itching, Nausea And Vomiting and Rash    Medications:   Prior to Admission medications   Medication Sig Start Date End Date Taking? Authorizing Provider  albuterol  (VENTOLIN  HFA) 108 (90 Base) MCG/ACT inhaler Inhale 1 puff into the lungs every 4 (four) hours as needed for wheezing or shortness of breath. 09/14/23  Yes Juberg, Christopher, DO  colchicine  0.6 MG tablet Take 0.5 tablets (0.3 mg total) by mouth daily. 10/08/23  Yes Cleven Dallas, DO  FARXIGA  10 MG TABS tablet Take 1 tablet by mouth once daily 08/31/23  Yes Amoako, Prince, MD  fluticasone  (FLONASE ) 50 MCG/ACT nasal spray Place 2 sprays into both nostrils daily. Patient taking differently: Place 1 spray into both nostrils daily. 05/17/23 05/16/24 Yes Amoako, Prince, MD  Fluticasone -Umeclidin-Vilant (TRELEGY ELLIPTA ) 100-62.5-25 MCG/ACT AEPB Inhale 1 puff into the lungs daily. 09/14/23  Yes Carleen Chary, DO  gabapentin  (NEURONTIN ) 100 MG capsule Take 2 capsules (200 mg total) by mouth 3 (three) times daily. 10/08/23  Yes Cleven Dallas, DO  HYDROcodone -acetaminophen  (NORCO/VICODIN) 5-325 MG tablet Take 1 tablet by mouth every 4 (four) hours as needed for moderate pain (pain score 4-6). Diagnosis- chronic low back pain 09/17/23  Yes Lovorn, Megan, MD  hydrOXYzine  (ATARAX ) 10 MG tablet TAKE 1 TABLET BY MOUTH THREE TIMES DAILY AS NEEDED FOR ANXIETY 10/18/23  Yes Amoako, Prince, MD  losartan  (COZAAR ) 25 MG tablet Take 0.5 tablets (12.5 mg total) by mouth daily. 08/10/23 11/08/23 Yes Milford, Arlice Bene, FNP  metoprolol  succinate (TOPROL -XL) 25 MG 24 hr tablet Take 1 tablet (25 mg total) by mouth 2 (two) times  daily. 08/01/23  Yes Jayson Michael, MD  potassium chloride  (KLOR-CON  M) 10 MEQ tablet Take 2 tablets (20 mEq total) by mouth daily. 09/07/23 04/04/24 Yes Amoako, Prince, MD  senna-docusate (SENOKOT-S) 8.6-50 MG tablet Take 1 tablet by mouth at bedtime as needed for mild constipation. 10/08/23  Yes Cleven Dallas, DO  simvastatin  (ZOCOR ) 20 MG tablet Take 1 tablet (20 mg total) by mouth daily. 12/03/22  Yes Allana Arabia, MD  torsemide  (DEMADEX ) 20 MG tablet Take 4 tablets (80 mg total) by mouth daily. 10/08/23  Yes Cleven Dallas, DO  Vitamin D , Ergocalciferol , (DRISDOL ) 1.25 MG (50000 UNIT) CAPS capsule Take 1 capsule (50,000 Units total) by mouth every 7 (seven) days. 10/11/23  Yes Cleven Dallas, DO  warfarin (COUMADIN ) 5 MG tablet Take 1 tablet (5 mg total) by mouth daily at 4 PM. 10/08/23  Yes Cleven Dallas, DO  allopurinol  (ZYLOPRIM ) 100 MG tablet Take 0.5 tablets (50 mg total) by mouth every other day. Patient not taking: Reported on 10/19/2023 10/13/23   Malen Scudder, DO    Discontinued Meds:   Medications Discontinued During This Encounter  Medication Reason   warfarin (COUMADIN ) tablet 5 mg    warfarin (COUMADIN ) tablet 5 mg    Warfarin - Pharmacist Dosing Inpatient    piperacillin -tazobactam (ZOSYN ) IVPB 2.25 g    vancomycin  variable dose per unstable renal function (pharmacist dosing)    0.9 %  sodium chloride  infusion    vancomycin  (VANCOCIN ) IVPB 1000 mg/200 mL premix  budeson-glycopyrrolate -formoterol  (BREZTRI ) 160-9-4.8 MCG/ACT inhaler 2 puff    ipratropium-albuterol  (DUONEB) 0.5-2.5 (3) MG/3ML nebulizer solution 3 mL    sodium zirconium cyclosilicate (LOKELMA) packet 10 g     Social History:  reports that she has been smoking cigarettes. She has a 3 pack-year smoking history. She has never used smokeless tobacco. She reports current alcohol use of about 3.0 standard drinks of alcohol per week. She reports that she does not use drugs.  Family History:   Family History   Problem Relation Age of Onset   Hypertension Mother    Cancer Father    Hypertension Father    Breast cancer Maternal Aunt 50   Heart attack Other     Blood pressure 102/60, pulse (!) 55, temperature 98.1 F (36.7 C), resp. rate (!) 22, height 5\' 5"  (1.651 m), weight 75.5 kg, SpO2 96%. Physical Exam: General appearance: NAD, answering questions Head: NCAT Back: No CVA tenderness. Cardio: bradycardic GI: SNDNT+BS Extremities: edema none Pulses: 2+ and symmetric Skin: Skin color, texture, turgor normal. No rashes or lesions Access: RIJ temp    Patrick Boor, MD 10/20/2023, 9:26 AM

## 2023-10-20 NOTE — Evaluation (Signed)
 Clinical/Bedside Swallow Evaluation Patient Details  Name: Kim Anthony [name]: [id number] Date of Birth: Aug 27, 1950  Today's Date: 10/20/2023 Time: SLP Start Time (ACUTE ONLY): 7829 SLP Stop Time (ACUTE ONLY): 0902 SLP Time Calculation (min) (ACUTE ONLY): 10 min  Past Medical History:  Past Medical History:  Diagnosis Date   Acute delirium 08/30/2023   Acute GI bleeding 08/18/2023   Arthritis    Asthma    Breast cancer (HCC) 2001   Left Breast Cancer   CHF (congestive heart failure) (HCC)    Closed fracture of right hip (HCC) 09/23/2022   Closed intertrochanteric fracture of right femur, initial encounter (HCC) 09/22/2022   COPD (chronic obstructive pulmonary disease) (HCC)    Coronary artery disease    GERD (gastroesophageal reflux disease)    History of mitral valve prosthesis 07/31/2023   History of mitral valve replacement with mechanical valve    HLD (hyperlipidemia)    Hypertension    Personal history of breast cancer    Left Breast Cancer     Pulmonary embolism (HCC) 2021   Past Surgical History:  Past Surgical History:  Procedure Laterality Date   APPLICATION OF A-CELL OF CHEST/ABDOMEN Left 01/27/2019   Procedure: APPLICATION OF A-CELL OF CHEST;  Surgeon: Thornell Flirt, DO;  Location: [facility] OR;  Service: Plastics;  Laterality: Left;   APPLICATION OF WOUND VAC N/A 01/27/2019   Procedure: APPLICATION OF WOUND VAC;  Surgeon: Thornell Flirt, DO;  Location: [facility] OR;  Service: Plastics;  Laterality: N/A;   BREAST SURGERY Left 2001   for CA   CARDIOVERSION N/A 02/26/2022   Procedure: CARDIOVERSION;  Surgeon: Wendie Hamburg, MD;  Location: [facility] ENDOSCOPY;  Service: Cardiovascular;  Laterality: N/A;   COLONOSCOPY WITH PROPOFOL  N/A 06/07/2018   Procedure: COLONOSCOPY WITH PROPOFOL ;  Surgeon: Baldo Bonds, MD;  Location: [facility] ENDOSCOPY;  Service: Endoscopy;  Laterality: N/A;   DEBRIDEMENT AND CLOSURE WOUND Left 12/28/2018   Procedure: Debridement And Closure  Wound;  Surgeon: Oza Blumenthal, MD;  Location: [facility] OR;  Service: General;  Laterality: Left;   ESOPHAGOGASTRODUODENOSCOPY (EGD) WITH PROPOFOL  N/A 08/24/2023   Procedure: ESOPHAGOGASTRODUODENOSCOPY (EGD) WITH PROPOFOL ;  Surgeon: Renaye Carp, DO;  Location: [facility] ENDOSCOPY;  Service: Gastroenterology;  Laterality: N/A;   INCISION AND DRAINAGE OF WOUND Left 01/27/2019   Procedure: IRRIGATION AND DEBRIDEMENT OF CHEST WALL;  Surgeon: Thornell Flirt, DO;  Location: [facility];  Service: Plastics;  Laterality: Left;   INTRAMEDULLARY (IM) NAIL INTERTROCHANTERIC N/A 09/23/2022   Procedure: INTRAMEDULLARY (IM) NAIL INTERTROCHANTERIC;  Surgeon: Wes Hamman, MD;  Location: [facility];  Service: Orthopedics;  Laterality: N/A;   IR FLUORO GUIDE CV LINE RIGHT  10/01/2023   IR US  GUIDE VASC ACCESS RIGHT  10/01/2023   IR VENO/EXT/UNI RIGHT  10/05/2023   KNEE ARTHROSCOPY WITH MEDIAL MENISECTOMY Left 08/06/2021   Procedure: KNEE ARTHROSCOPY WITH PARTIAL MEDIAL MENISECTOMY, DEBRIDEMENT;  Surgeon: Orvan Blanch, MD;  Location: [facility] ORS;  Service: Orthopedics;  Laterality: Left;  1 HR   LATISSIMUS FLAP TO BREAST Left 01/18/2019   Procedure: LEFT LATISSIMUS FLAP TO BREAST;  Surgeon: Thornell Flirt, DO;  Location: [facility];  Service: Plastics;  Laterality: Left;   MASTECTOMY Left    MITRAL VALVE REPLACEMENT     mechanical   POLYPECTOMY  06/07/2018   Procedure: POLYPECTOMY;  Surgeon: Baldo Bonds, MD;  Location: [facility] ENDOSCOPY;  Service: Endoscopy;;   RIGHT HEART CATH N/A 08/20/2023   Procedure: RIGHT HEART CATH;  Surgeon: Mardell Shade, MD;  [facility];  Service: Cardiovascular;  Laterality: N/A;   RIGHT/LEFT HEART CATH AND CORONARY ANGIOGRAPHY N/A 01/09/2021   Procedure: RIGHT/LEFT HEART CATH AND CORONARY ANGIOGRAPHY;  Surgeon: Mardell Shade, MD;  [facility];  Service: Cardiovascular;  Laterality: N/A;   WOUND DEBRIDEMENT Left 12/28/2018   Procedure: EXCISION OF  LEFT CHRONIC CHEST WALL WOUND;  Surgeon: Oza Blumenthal, MD;  Location: [facility] OR;  Service: General;  Laterality: Left;   HPI:  Kim Anthony is as 73 year old female who presents with worsening renal failure. CXR 4/29: "Mildly worsened bibasilar atelectasis. Small right pleural effusion, slightly  increased. Mild pulmonary edema, slightly worsened. No pneumothorax." Pt with a history of breast cancer, COPD with continued tobacco, O2 dependent at 2 L, right heart failure, and atrial fibrillation on warfarin.    Assessment / Plan / Recommendation  Clinical Impression  Pt presents with a mild oral dysphagia 2/2 edentulism and clinical s/s of pharyngeal dysphagia.  SLP provided oral care prior to PO trials.  Xerostomia noted.  Pt exhibited a delayed mild throat clear frequently, but not consistently with all PO trials, solids>liquids.  RN reports this has been baseline.  Pt unable to bite through regular solid cracker without dentures, which she says she sometimes wears with POs. She exhibited good oral clearance of soft solid trials.  Chest imaging does not suggest pna at present.  Pt currently on CRRT and cannot transport for MBSS.  She indicated she does not think she could tolerate FEES study.  Will initiate a PO diet with 1:1 assist.  If there a change to baseline cough with POs, we will revisit FEES study v cortrak placement.  SLP will follow for tolerance and advancement should dentures become available.    Recommend mechanical soft diet with thin liquids.   SLP Visit Diagnosis: Dysphagia, unspecified (R13.10)    Aspiration Risk  Mild aspiration risk    Diet Recommendation Dysphagia 3 (Mech soft);Thin liquid    Liquid Administration via: Cup;Straw Medication Administration:  (As tolerated) Supervision: Staff to assist with self feeding;Full supervision/cueing for compensatory strategies Compensations: Slow rate;Small sips/bites Postural Changes: Seated upright at 90 degrees    Other   Recommendations Oral Care Recommendations: Oral care BID    Recommendations for follow up therapy are one component of a multi-disciplinary discharge planning process, led by the attending physician.  Recommendations may be updated based on patient status, additional functional criteria and insurance authorization.  Follow up Recommendations Skilled nursing-short term rehab (<3 hours/day)      Assistance Recommended at Discharge  N/A  Functional Status Assessment Patient has had a recent decline in their functional status and demonstrates the ability to make significant improvements in function in a reasonable and predictable amount of time.  Frequency and Duration min 2x/week  2 weeks       Prognosis Prognosis for improved oropharyngeal function: Good      Swallow Study   General Date of Onset: 10/17/23 HPI: Kim Anthony is as 73 year old female who presents with worsening renal failure. CXR 4/29: "Mildly worsened bibasilar atelectasis. Small right pleural effusion, slightly  increased. Mild pulmonary edema, slightly worsened. No pneumothorax." Pt with a history of breast cancer, COPD with continued tobacco, O2 dependent at 2 L, right heart failure, and atrial fibrillation on warfarin. Type of Study: Bedside Swallow Evaluation Previous Swallow Assessment: None Diet Prior to this Study: NPO Temperature Spikes Noted: No Respiratory Status: Nasal cannula History of Recent Intubation: No Behavior/Cognition: Alert;Cooperative Oral  Cavity Assessment: Lesions Oral Care Completed by SLP: Yes Oral Cavity - Dentition: Edentulous (dentures at home) Self-Feeding Abilities: Needs assist Patient Positioning: Upright in bed Baseline Vocal Quality: Normal Volitional Cough: Weak Volitional Swallow: Unable to elicit    Oral/Motor/Sensory Function Overall Oral Motor/Sensory Function: Mild impairment Facial ROM: Within Functional Limits Facial Symmetry: Within Functional Limits Lingual ROM:   (unable to assess) Lingual Symmetry:  (unable to assess) Lingual Strength:  (unable to assess) Velum: Within Functional Limits Mandible: Within Functional Limits   Ice Chips Ice chips: Not tested   Thin Liquid Thin Liquid: Impaired Presentation: Straw Pharyngeal  Phase Impairments: Throat Clearing - Delayed;Cough - Delayed    Nectar Thick Nectar Thick Liquid: Not tested   Honey Thick Honey Thick Liquid: Not tested   Puree Puree: Impaired Pharyngeal Phase Impairments: Cough - Delayed;Throat Clearing - Delayed   Solid     Solid: Impaired Presentation: Spoon Pharyngeal Phase Impairments: Throat Clearing - Delayed;Cough - Delayed      Kim Grim, MA, CCC-SLP Acute Rehabilitation Services Office: [phone] 10/20/2023,9:31 AM

## 2023-10-20 NOTE — Progress Notes (Addendum)
 Initial Nutrition Assessment  DOCUMENTATION CODES:  Not applicable  INTERVENTION:  Diet advancement per SLP; currently on dysphagia 3, thin liquids Ensure Enlive po BID, each supplement provides 350 kcal and 20 grams of protein. 30 ml ProSource Plus TID, each supplement provides 100 kcals and 15 grams protein.  Renal MVI with minerals daily  NUTRITION DIAGNOSIS:  Increased nutrient needs related to acute illness, catabolic illness as evidenced by estimated needs.  GOAL:  Patient will meet greater than or equal to 90% of their needs  MONITOR:  PO intake, Supplement acceptance, Labs, Weight trends, Diet advancement, I & O's  REASON FOR ASSESSMENT:  Consult Other (Comment) (CRRT protocol)  ASSESSMENT:  Pt admitted with worsening renal failure and hypotension. PMH significant for breast cancer, COPD, tobacco use, right heart failure, afib on warfarin. Recently admitted 4/9-4/18 for CHF exacerbation.   4/29: admitted, CRRT initiated 4/30: bedside swallow w/SLP- dysphagia 3, thin liquids (edentulous)  Checked in with pt at bedside following SLP evaluation. SLP recommended initiating diet with 1:1 assistance. Plan to continue to assess for ability to adjust diet versus need for MBS.   Nutrition history limited as pt having difficult time with coughing fit and clearing secretions at time of visit. Pt also notably with ongoing acute metabolic encephalopathy 2/2 renal failure.   Admit weight: 68 kg (question whether this is stated vs measured) Current weight: 75.5 kg + edema: non-pitting generalized, BUE, BLE  Drains/lines: A-line RIJ triple lumen HD cath UOP: x24 hours  CRRT: 788ml x24 hours  Medications: SSI 1-3 units q4h, lokelma Drips: Pepcid  20mg  once daily Levo @ 4mcg/min  Labs:  Sodium 133 BUN 45 Cr 1.75 Albumin 2.9 GFR 30 CBG's 39-113  NUTRITION - FOCUSED PHYSICAL EXAM: Suspect pt with a degree of malnutrition however with limited history and nutrition  information, unable to confirm at this time. Will continue to assess throughout admission.  Flowsheet Row Most Recent Value  Orbital Region No depletion  Upper Arm Region Mild depletion  Thoracic and Lumbar Region No depletion  Buccal Region No depletion  Temple Region Mild depletion  Clavicle Bone Region Mild depletion  Clavicle and Acromion Bone Region Moderate depletion  Scapular Bone Region Unable to assess  Dorsal Hand Mild depletion  Patellar Region Severe depletion  Anterior Thigh Region Severe depletion  Posterior Calf Region Severe depletion  Edema (RD Assessment) None  Hair Reviewed  Eyes Reviewed  Mouth Reviewed  Skin Reviewed  Nails Reviewed       Diet Order:   Diet Order             DIET DYS 3 Room service appropriate? Yes; Fluid consistency: Thin  Diet effective now                   EDUCATION NEEDS:   Not appropriate for education at this time  Skin:  Skin Assessment: Reviewed RN Assessment  Last BM:  unknown/PTA  Height:   Ht Readings from Last 1 Encounters:  10/19/23 5\' 5"  (1.651 m)    Weight:   Wt Readings from Last 1 Encounters:  10/20/23 75.5 kg    Ideal Body Weight:  56.8 kg  BMI:  Body mass index is 27.7 kg/m.  Estimated Nutritional Needs:   Kcal:  1600-1800  Protein:  90-105g  Fluid:  1L + UOP  Rocklin Chute, RDN, LDN Clinical Nutrition See AMiON for contact information.

## 2023-10-20 NOTE — Progress Notes (Addendum)
 eLink Physician-Brief Progress Note Patient Name: MEGON MURGO DOB: 1951-03-04 [name]: [id number]   Date of Service  10/20/2023  HPI/Events of Note  Patient on precedex  at 1.1 and still very agitated and confused and Bedside RN was asking if we could try melatonin or something else to help with sleep/anxiety for patient?   Camera: On mitts. Confused. VS stable.  On art line MAP 83. On nasal o2. Sats > 94%. HR 60. On CRRT.   7.18/51/47 VBG.   eICU Interventions  Discussed with RN. Just received nebs. Melatonin trial first, if not better might need low dose haldol or Zyprexa . Asp precautions.      Intervention Category Minor Interventions: Agitation / anxiety - evaluation and management  Rexann Catalan 10/20/2023, 8:44 PM  00:14 Bedside RN asking for robitussin for dry non prod cough keeping patient awake. Ordered prn.  02:34 Patient awake and screaming for help and very restess . Melatonin helped but only for short period.  Camera: VS stable. On nasal o2. Qtc borderline and HR 60's. Would avoid haldol, Zyprexa . Low dose ativan  once

## 2023-10-20 NOTE — Consult Note (Signed)
 NAME:  Kim Anthony, [name]:  [id number], DOB:  02/22/1951, LOS: 1 ADMISSION DATE:  10/18/2023, CONSULTATION DATE: 10/19/2023 REFERRING MD: Internal medicine, CHIEF COMPLAINT: Worsening renal failure hypotension  History of Present Illness:  73 year old female with a history of breast cancer, COPD with continued tobacco, O2 dependent at 2 L, right heart failure, and atrial fibrillation on warfarin.  who presents with worsening renal failure potassium 6.1 white count of 17 hypotension and bradycardia.  She had calcium  gluconate for her elevated potassium sodium bicarbonate.  Her baseline creatinine is 1.85 currently is 4.07 potassium is 6.1.  She currently has been fluid resuscitated for hypotension and bradycardia is not on vasopressor support at this time.  She does have elevated white count therefore she is on empirical antimicrobial therapy she does open her eyes and follow commands at this time and protect her airway therefore does not need intubation at this time.  She has been scheduled for interventional radiology to place hemodialysis catheter we will continue gentle fluid resuscitation.  Pertinent  Medical History   Past Medical History:  Diagnosis Date   Acute delirium 08/30/2023   Acute GI bleeding 08/18/2023   Arthritis    Asthma    Breast cancer (HCC) 2001   Left Breast Cancer   CHF (congestive heart failure) (HCC)    Closed fracture of right hip (HCC) 09/23/2022   Closed intertrochanteric fracture of right femur, initial encounter (HCC) 09/22/2022   COPD (chronic obstructive pulmonary disease) (HCC)    Coronary artery disease    GERD (gastroesophageal reflux disease)    History of mitral valve prosthesis 07/31/2023   History of mitral valve replacement with mechanical valve    HLD (hyperlipidemia)    Hypertension    Personal history of breast cancer    Left Breast Cancer     Pulmonary embolism (HCC) 2021     Significant Hospital Events: Including procedures, antibiotic  start and stop dates in addition to other pertinent events   4/29 Admitted with renal failure, hypotension, HyperK. Admitted to ICU, CRRT. Pressors.  4/30 Precedex  started overnight. On 5 mcg NE. CRRT ongoing  Interim History / Subjective:  Some agitation overnight. Precedex  started. Remains on 5mcg NE with some room to wean.  No complaints this morning.   Objective   Blood pressure (!) 122/102, pulse (!) 57, temperature 99 F (37.2 C), resp. rate 12, height 5\' 5"  (1.651 m), weight 75.5 kg, SpO2 97%.        Intake/Output Summary (Last 24 hours) at 10/20/2023 0719 Last data filed at 10/20/2023 0700 Gross per 24 hour  Intake 2396.61 ml  Output 1322.5 ml  Net 1074.11 ml   Filed Weights   10/19/23 1141 10/19/23 1634 10/20/23 0500  Weight: 75.7 kg 75.7 kg 75.5 kg    Examination:  General: elderly appearing female in NAD HENT: [place]/AT, PERRL, no JVD Lungs: Clear, no distress.  Cardiovascular: rate 58, regular Abdomen: Soft, NT, ND hypoactive.  Extremities: No acute deformity or edema. Moves all extremities Neuro: Somnolent, but easily arouses to cooperate with exam.   GU: is making some urine, foley  Labs: K 4.2, BUN 45, Cr 1.75, WBC 15.1, Hgb 10.1, INR 4.7  Resolved Hospital Problem list     Assessment & Plan:   Acute on CKD 3b - CRRT per nephrology - Holding losartan , torsemide , farxiga  - Trend renal indices, I/O  Hypotension - NE for MAP goal 65. Currently on 5 mcg with room to wean.  - 2D echo  Mechanical valve on warfarin gaol INR 2.5-3.5 Atrial fibrillation - INR up to 4.7 - In absence of bleeding, will hold warfarin and allow INR to correct  - Can give FFP if needed for active bleed - Will plan to use heparin  once INR corrects.   Leukocytosis: CXR and UA unrevealing. Parotid glands appear swollen but were recently worked up and were benign.  - Cultures pending - MRSA swab negative - Continue Zosyn  - Narrow/DC as able.   Acute metabolic encephalopathy in  the setting of renal failure - Precedex  infusion, wean as able - Frequent re-orientation - Maintain good sleep/wake cycles  Diabetes mellitus - Sliding scale insulin   COPD without exacerbation Chronic hypoxic respiratory failure with chronic 2L O2 need O2 as needed Currently protecting airway Chest x-ray shows mostly chronic changes of COPD and congestive heart failure Triple neb therapy in place of home Trelegy    Best Practice (right click and "Reselect all SmartList Selections" daily)   Diet/type: NPO DVT prophylaxis Coumadin  - hold. INR 4 Pressure ulcer(s): N/A GI prophylaxis: PPI Lines: Dialysis Catheter and Arterial Line Foley:  Yes, and it is still needed Code Status:  full code Last date of multidisciplinary goals of care discussion [TBD]  Labs   CBC: Recent Labs  Lab 10/18/23 2342 10/19/23 0122 10/19/23 0530 10/19/23 0600 10/19/23 0605 10/20/23 0229  WBC 19.7*  --  17.5*  --   --  15.1*  HGB 10.3* 12.9 10.7* 11.2* 11.2* 10.1*  HCT 38.3 38.0 36.7 33.0* 33.0* 33.7*  MCV 99.2  --  92.4  --   --  90.1  PLT 417*  --  443*  --   --  448*    Basic Metabolic Panel: Recent Labs  Lab 10/13/23 1549 10/13/23 1549 10/19/23 0115 10/19/23 0122 10/19/23 0523 10/19/23 0600 10/19/23 0605 10/19/23 1530 10/20/23 0229  NA 134   < > 129* 126* 128* 130* 129* 131* 133*  K 5.0  --  5.8* 5.6* 6.1* 5.7* 6.5* 5.2* 4.2  CL 97  --  98 103 100  --   --  100 102  CO2 18*  --  18*  --  17*  --   --  19* 22  GLUCOSE 94  --  88 86 90  --   --  87 107*  BUN 70*  --  104* 111* 102*  --   --  102* 45*  CREATININE 1.85*  --  4.04* 4.10* 4.07*  --   --  3.88* 1.75*  CALCIUM  10.0  --  9.1  --  8.9  --   --  8.8* 9.5  MG  --   --   --   --  2.8*  --   --   --  2.0  PHOS  --   --   --   --  7.9*  --   --  7.9* 3.3   < > = values in this interval not displayed.   GFR: Estimated Creatinine Clearance: 29.1 mL/min (A) (by C-G formula based on SCr of 1.75 mg/dL (H)). Recent Labs  Lab  10/18/23 2342 10/19/23 0523 10/19/23 0530 10/19/23 0842 10/20/23 0229  WBC 19.7*  --  17.5*  --  15.1*  LATICACIDVEN  --  0.9  --  1.0  --     Liver Function Tests: Recent Labs  Lab 10/19/23 0523 10/19/23 1530 10/20/23 0229  AST 18  --   --   ALT 20  --   --  ALKPHOS 108  --   --   BILITOT 0.8  --   --   PROT 6.9  --   --   ALBUMIN 3.5  3.5 3.0* 2.9*   No results for input(s): "LIPASE", "AMYLASE" in the last 168 hours. No results for input(s): "AMMONIA" in the last 168 hours.  ABG    Component Value Date/Time   PHART 7.345 (L) 01/09/2021 1341   PCO2ART 44.2 01/09/2021 1341   PO2ART 78 (L) 01/09/2021 1341   HCO3 19.4 (L) 10/19/2023 0605   TCO2 21 (L) 10/19/2023 0605   ACIDBASEDEF 9.0 (H) 10/19/2023 0605   O2SAT 72 10/19/2023 0605     Coagulation Profile: Recent Labs  Lab 10/19/23 0115 10/20/23 0229  INR 3.3* 4.7*    Cardiac Enzymes: Recent Labs  Lab 10/19/23 0632  CKTOTAL 78    HbA1C: Hgb A1c MFr Bld  Date/Time Value Ref Range Status  08/18/2023 02:12 AM 6.0 (H) 4.8 - 5.6 % Final    Comment:    (NOTE) Pre diabetes:          5.7%-6.4%  Diabetes:              >6.4%  Glycemic control for   <7.0% adults with diabetes   09/21/2022 10:24 AM 5.5 4.8 - 5.6 % Final    Comment:    (NOTE)         Prediabetes: 5.7 - 6.4         Diabetes: >6.4         Glycemic control for adults with diabetes: <7.0     CBG: Recent Labs  Lab 10/19/23 1538 10/19/23 1920 10/19/23 1922 10/19/23 2329 10/20/23 0355  GLUCAP 80 55* 95 105* 113*    Review of Systems:   na  Past Medical History:  She,  has a past medical history of Acute delirium (08/30/2023), Acute GI bleeding (08/18/2023), Arthritis, Asthma, Breast cancer (HCC) (2001), CHF (congestive heart failure) (HCC), Closed fracture of right hip (HCC) (09/23/2022), Closed intertrochanteric fracture of right femur, initial encounter (HCC) (09/22/2022), COPD (chronic obstructive pulmonary disease) (HCC),  Coronary artery disease, GERD (gastroesophageal reflux disease), History of mitral valve prosthesis (07/31/2023), History of mitral valve replacement with mechanical valve, HLD (hyperlipidemia), Hypertension, Personal history of breast cancer, and Pulmonary embolism (HCC) (2021).   Surgical History:   Past Surgical History:  Procedure Laterality Date   APPLICATION OF A-CELL OF CHEST/ABDOMEN Left 01/27/2019   Procedure: APPLICATION OF A-CELL OF CHEST;  Surgeon: Thornell Flirt, DO;  Location: [facility] OR;  Service: Plastics;  Laterality: Left;   APPLICATION OF WOUND VAC N/A 01/27/2019   Procedure: APPLICATION OF WOUND VAC;  Surgeon: Thornell Flirt, DO;  Location: [facility] OR;  Service: Plastics;  Laterality: N/A;   BREAST SURGERY Left 2001   for CA   CARDIOVERSION N/A 02/26/2022   Procedure: CARDIOVERSION;  Surgeon: Wendie Hamburg, MD;  Location: [facility] ENDOSCOPY;  Service: Cardiovascular;  Laterality: N/A;   COLONOSCOPY WITH PROPOFOL  N/A 06/07/2018   Procedure: COLONOSCOPY WITH PROPOFOL ;  Surgeon: Baldo Bonds, MD;  Location: [facility] ENDOSCOPY;  Service: Endoscopy;  Laterality: N/A;   DEBRIDEMENT AND CLOSURE WOUND Left 12/28/2018   Procedure: Debridement And Closure Wound;  Surgeon: Oza Blumenthal, MD;  Location: [facility] OR;  Service: General;  Laterality: Left;   ESOPHAGOGASTRODUODENOSCOPY (EGD) WITH PROPOFOL  N/A 08/24/2023   Procedure: ESOPHAGOGASTRODUODENOSCOPY (EGD) WITH PROPOFOL ;  Surgeon: Renaye Carp, DO;  Location: [facility] ENDOSCOPY;  Service: Gastroenterology;  Laterality: N/A;   INCISION AND DRAINAGE  OF WOUND Left 01/27/2019   Procedure: IRRIGATION AND DEBRIDEMENT OF CHEST WALL;  Surgeon: Thornell Flirt, DO;  Location: [facility] OR;  Service: Plastics;  Laterality: Left;   INTRAMEDULLARY (IM) NAIL INTERTROCHANTERIC N/A 09/23/2022   Procedure: INTRAMEDULLARY (IM) NAIL INTERTROCHANTERIC;  Surgeon: Wes Hamman, MD;  Location: [facility] OR;  Service: Orthopedics;  Laterality: N/A;   IR  FLUORO GUIDE CV LINE RIGHT  10/01/2023   IR US  GUIDE VASC ACCESS RIGHT  10/01/2023   IR VENO/EXT/UNI RIGHT  10/05/2023   KNEE ARTHROSCOPY WITH MEDIAL MENISECTOMY Left 08/06/2021   Procedure: KNEE ARTHROSCOPY WITH PARTIAL MEDIAL MENISECTOMY, DEBRIDEMENT;  Surgeon: Orvan Blanch, MD;  Location: [facility] ORS;  Service: Orthopedics;  Laterality: Left;  1 HR   LATISSIMUS FLAP TO BREAST Left 01/18/2019   Procedure: LEFT LATISSIMUS FLAP TO BREAST;  Surgeon: Thornell Flirt, DO;  Location: [facility] OR;  Service: Plastics;  Laterality: Left;   MASTECTOMY Left    MITRAL VALVE REPLACEMENT     mechanical   POLYPECTOMY  06/07/2018   Procedure: POLYPECTOMY;  Surgeon: Baldo Bonds, MD;  Location: [facility] ENDOSCOPY;  Service: Endoscopy;;   RIGHT HEART CATH N/A 08/20/2023   Procedure: RIGHT HEART CATH;  Surgeon: Mardell Shade, MD;  Location: [facility] INVASIVE CV LAB;  Service: Cardiovascular;  Laterality: N/A;   RIGHT/LEFT HEART CATH AND CORONARY ANGIOGRAPHY N/A 01/09/2021   Procedure: RIGHT/LEFT HEART CATH AND CORONARY ANGIOGRAPHY;  Surgeon: Mardell Shade, MD;  Location: [facility] INVASIVE CV LAB;  Service: Cardiovascular;  Laterality: N/A;   WOUND DEBRIDEMENT Left 12/28/2018   Procedure: EXCISION OF LEFT CHRONIC CHEST WALL WOUND;  Surgeon: Oza Blumenthal, MD;  Location: [facility] OR;  Service: General;  Laterality: Left;     Social History:   reports that she has been smoking cigarettes. She has a 3 pack-year smoking history. She has never used smokeless tobacco. She reports current alcohol use of about 3.0 standard drinks of alcohol per week. She reports that she does not use drugs.   Family History:  Her family history includes Breast cancer (age of onset: 30) in her maternal aunt; Cancer in her father; Heart attack in an other family member; Hypertension in her father and mother.   Allergies Allergies  Allergen Reactions   Lisinopril  Swelling   Acetaminophen -Codeine Itching   Propoxyphene Itching    Darvocet    Tape Rash    Plastic   Tramadol  Itching, Nausea And Vomiting and Rash     Home Medications  Prior to Admission medications   Medication Sig Start Date End Date Taking? Authorizing Provider  albuterol  (VENTOLIN  HFA) 108 (90 Base) MCG/ACT inhaler Inhale 1 puff into the lungs every 4 (four) hours as needed for wheezing or shortness of breath. 09/14/23  Yes Juberg, Christopher, DO  colchicine  0.6 MG tablet Take 0.5 tablets (0.3 mg total) by mouth daily. 10/08/23  Yes Cleven Dallas, DO  FARXIGA  10 MG TABS tablet Take 1 tablet by mouth once daily 08/31/23  Yes Amoako, Prince, MD  fluticasone  (FLONASE ) 50 MCG/ACT nasal spray Place 2 sprays into both nostrils daily. Patient taking differently: Place 1 spray into both nostrils daily. 05/17/23 05/16/24 Yes [name], MD  Fluticasone -Umeclidin-Vilant (TRELEGY ELLIPTA ) 100-62.5-25 MCG/ACT AEPB Inhale 1 puff into the lungs daily. 09/14/23  Yes Carleen Chary, DO  gabapentin  (NEURONTIN ) 100 MG capsule Take 2 capsules (200 mg total) by mouth 3 (three) times daily. 10/08/23  Yes Cleven Dallas, DO  HYDROcodone -acetaminophen  (NORCO/VICODIN) 5-325 MG tablet Take 1 tablet by mouth every 4 (  four) hours as needed for moderate pain (pain score 4-6). Diagnosis- chronic low back pain 09/17/23  Yes Lovorn, Megan, MD  hydrOXYzine  (ATARAX ) 10 MG tablet TAKE 1 TABLET BY MOUTH THREE TIMES DAILY AS NEEDED FOR ANXIETY 10/18/23  Yes Amoako, Prince, MD  losartan  (COZAAR ) 25 MG tablet Take 0.5 tablets (12.5 mg total) by mouth daily. 08/10/23 11/08/23 Yes Milford, Arlice Bene, FNP  metoprolol  succinate (TOPROL -XL) 25 MG 24 hr tablet Take 1 tablet (25 mg total) by mouth 2 (two) times daily. 08/01/23  Yes Jayson Michael, MD  potassium chloride  (KLOR-CON  M) 10 MEQ tablet Take 2 tablets (20 mEq total) by mouth daily. 09/07/23 04/04/24 Yes Amoako, Prince, MD  senna-docusate (SENOKOT-S) 8.6-50 MG tablet Take 1 tablet by mouth at bedtime as needed for mild constipation. 10/08/23  Yes  Cleven Dallas, DO  simvastatin  (ZOCOR ) 20 MG tablet Take 1 tablet (20 mg total) by mouth daily. 12/03/22  Yes Allana Arabia, MD  torsemide  (DEMADEX ) 20 MG tablet Take 4 tablets (80 mg total) by mouth daily. 10/08/23  Yes Cleven Dallas, DO  Vitamin D , Ergocalciferol , (DRISDOL ) 1.25 MG (50000 UNIT) CAPS capsule Take 1 capsule (50,000 Units total) by mouth every 7 (seven) days. 10/11/23  Yes Cleven Dallas, DO  warfarin (COUMADIN ) 5 MG tablet Take 1 tablet (5 mg total) by mouth daily at 4 PM. 10/08/23  Yes Cleven Dallas, DO  allopurinol  (ZYLOPRIM ) 100 MG tablet Take 0.5 tablets (50 mg total) by mouth every other day. Patient not taking: Reported on 10/19/2023 10/13/23   Malen Scudder, DO     Critical care time: 42 min      Roz Cornelia, AGACNP-BC [facility]  See Amion for personal pager PCCM on call pager [phone] until 7pm. Please call Elink 7p-7a. [phone]  10/20/2023 7:45 AM

## 2023-10-21 DIAGNOSIS — N1831 Chronic kidney disease, stage 3a: Secondary | ICD-10-CM

## 2023-10-21 DIAGNOSIS — N179 Acute kidney failure, unspecified: Secondary | ICD-10-CM | POA: Diagnosis not present

## 2023-10-21 DIAGNOSIS — Z7189 Other specified counseling: Secondary | ICD-10-CM | POA: Diagnosis not present

## 2023-10-21 DIAGNOSIS — N1832 Chronic kidney disease, stage 3b: Secondary | ICD-10-CM | POA: Diagnosis not present

## 2023-10-21 DIAGNOSIS — R6520 Severe sepsis without septic shock: Secondary | ICD-10-CM

## 2023-10-21 DIAGNOSIS — A419 Sepsis, unspecified organism: Principal | ICD-10-CM

## 2023-10-21 DIAGNOSIS — I4891 Unspecified atrial fibrillation: Secondary | ICD-10-CM | POA: Diagnosis not present

## 2023-10-21 DIAGNOSIS — I5032 Chronic diastolic (congestive) heart failure: Secondary | ICD-10-CM

## 2023-10-21 DIAGNOSIS — E119 Type 2 diabetes mellitus without complications: Secondary | ICD-10-CM

## 2023-10-21 DIAGNOSIS — D72829 Elevated white blood cell count, unspecified: Secondary | ICD-10-CM

## 2023-10-21 DIAGNOSIS — E875 Hyperkalemia: Secondary | ICD-10-CM | POA: Diagnosis not present

## 2023-10-21 LAB — COMPREHENSIVE METABOLIC PANEL WITH GFR
ALT: 17 U/L (ref 0–44)
AST: 19 U/L (ref 15–41)
Albumin: 2.7 g/dL — ABNORMAL LOW (ref 3.5–5.0)
Alkaline Phosphatase: 143 U/L — ABNORMAL HIGH (ref 38–126)
Anion gap: 8 (ref 5–15)
BUN: 15 mg/dL (ref 8–23)
CO2: 23 mmol/L (ref 22–32)
Calcium: 8.6 mg/dL — ABNORMAL LOW (ref 8.9–10.3)
Chloride: 104 mmol/L (ref 98–111)
Creatinine, Ser: 0.87 mg/dL (ref 0.44–1.00)
GFR, Estimated: >60 mL/min (ref >60)
Glucose, Bld: 122 mg/dL — ABNORMAL HIGH (ref 70–99)
Potassium: 3.9 mmol/L (ref 3.5–5.1)
Sodium: 135 mmol/L (ref 135–145)
Total Bilirubin: 1.4 mg/dL — ABNORMAL HIGH (ref 0.0–1.2)
Total Protein: 6.1 g/dL — ABNORMAL LOW (ref 6.5–8.1)

## 2023-10-21 LAB — PHOSPHORUS: Phosphorus: 2 mg/dL — ABNORMAL LOW (ref 2.5–4.6)

## 2023-10-21 LAB — CBC
HCT: 31.5 % — ABNORMAL LOW (ref 36.0–46.0)
Hemoglobin: 9.4 g/dL — ABNORMAL LOW (ref 12.0–15.0)
MCH: 26.5 pg (ref 26.0–34.0)
MCHC: 29.8 g/dL — ABNORMAL LOW (ref 30.0–36.0)
MCV: 88.7 fL (ref 80.0–100.0)
Platelets: 335 10*3/uL (ref 150–400)
RBC: 3.55 MIL/uL — ABNORMAL LOW (ref 3.87–5.11)
RDW: 20.7 % — ABNORMAL HIGH (ref 11.5–15.5)
WBC: 11.6 10*3/uL — ABNORMAL HIGH (ref 4.0–10.5)
nRBC: 0.3 % — ABNORMAL HIGH (ref 0.0–0.2)

## 2023-10-21 LAB — RENAL FUNCTION PANEL
Albumin: 2.6 g/dL — ABNORMAL LOW (ref 3.5–5.0)
Anion gap: 9 (ref 5–15)
BUN: 10 mg/dL (ref 8–23)
CO2: 22 mmol/L (ref 22–32)
Calcium: 8.7 mg/dL — ABNORMAL LOW (ref 8.9–10.3)
Chloride: 106 mmol/L (ref 98–111)
Creatinine, Ser: 0.85 mg/dL (ref 0.44–1.00)
GFR, Estimated: >60 mL/min (ref >60)
Glucose, Bld: 109 mg/dL — ABNORMAL HIGH (ref 70–99)
Phosphorus: 3.6 mg/dL (ref 2.5–4.6)
Potassium: 3.5 mmol/L (ref 3.5–5.1)
Sodium: 137 mmol/L (ref 135–145)

## 2023-10-21 LAB — GLUCOSE, CAPILLARY
Glucose-Capillary: 119 mg/dL — ABNORMAL HIGH (ref 70–99)
Glucose-Capillary: 61 mg/dL — ABNORMAL LOW (ref 70–99)
Glucose-Capillary: 140 mg/dL — ABNORMAL HIGH (ref 70–99)
Glucose-Capillary: 119 mg/dL — ABNORMAL HIGH (ref 70–99)
Glucose-Capillary: 117 mg/dL — ABNORMAL HIGH (ref 70–99)
Glucose-Capillary: 136 mg/dL — ABNORMAL HIGH (ref 70–99)

## 2023-10-21 LAB — PROTIME-INR
INR: 2.6 — ABNORMAL HIGH (ref 0.8–1.2)
Prothrombin Time: 28.3 s — ABNORMAL HIGH (ref 11.4–15.2)

## 2023-10-21 LAB — MAGNESIUM: Magnesium: 2.2 mg/dL (ref 1.7–2.4)

## 2023-10-21 MED ORDER — DEXTROSE 50 % IV SOLN
12.5000 g | INTRAVENOUS | Status: AC
  Administered 2023-10-21: 12.5 g via INTRAVENOUS
  Filled 2023-10-21: qty 50

## 2023-10-21 MED ORDER — HYDRALAZINE HCL 20 MG/ML IJ SOLN: 10.0000 mg | INTRAMUSCULAR | Status: DC | PRN

## 2023-10-21 MED ORDER — SODIUM PHOSPHATES 45 MMOLE/15ML IV SOLN
15.0000 mmol | Freq: Once | INTRAVENOUS | Status: AC
  Administered 2023-10-21: 15 mmol via INTRAVENOUS
  Filled 2023-10-21: qty 5

## 2023-10-21 MED ORDER — QUETIAPINE FUMARATE 25 MG PO TABS
25.0000 mg | ORAL_TABLET | Freq: Every day | ORAL | Status: DC
  Administered 2023-10-21 – 2023-10-22 (×2): 25 mg via ORAL
  Filled 2023-10-21 (×2): qty 1

## 2023-10-21 MED ORDER — GABAPENTIN 100 MG PO CAPS
200.0000 mg | ORAL_CAPSULE | Freq: Three times a day (TID) | ORAL | Status: DC
  Administered 2023-10-21 – 2023-10-26 (×16): 200 mg via ORAL
  Filled 2023-10-21 (×16): qty 2

## 2023-10-21 MED ORDER — HEPARIN (PORCINE) 25000 UT/250ML-% IV SOLN
1300.0000 [IU]/h | INTRAVENOUS | Status: DC
  Administered 2023-10-21 (×2): 1000 [IU]/h via INTRAVENOUS
  Administered 2023-10-22 – 2023-10-25 (×4): 1300 [IU]/h via INTRAVENOUS
  Filled 2023-10-21 (×7): qty 250

## 2023-10-21 MED ORDER — GUAIFENESIN 100 MG/5ML PO LIQD
5.0000 mL | Freq: Four times a day (QID) | ORAL | Status: DC | PRN
  Administered 2023-10-21 – 2023-10-23 (×2): 5 mL via ORAL
  Filled 2023-10-21 (×2): qty 10

## 2023-10-21 MED ORDER — FAMOTIDINE 20 MG PO TABS
10.0000 mg | ORAL_TABLET | Freq: Every day | ORAL | Status: DC
  Administered 2023-10-21: 10 mg via ORAL
  Filled 2023-10-21: qty 1

## 2023-10-21 MED ORDER — LORAZEPAM 2 MG/ML IJ SOLN
0.5000 mg | Freq: Once | INTRAMUSCULAR | Status: AC
  Administered 2023-10-21: 0.5 mg via INTRAVENOUS
  Filled 2023-10-21: qty 1

## 2023-10-21 NOTE — Consult Note (Signed)
 Consultation Note Date: 10/21/2023   Patient Name: Kim Anthony  DOB: 1950/10/03  [name]: [id number]  Age / Sex: 73 y.o., female  PCP: [name], MD Referring Physician: Arlyne Bering, MD  Reason for Consultation: goals of care  HPI/Patient Profile: 73 y.o. female  with past medical history of COPD, remote history breast ca s/p mastectomy/radiation with no further recurrence, chronic respiratory failure on 2L oxygen  at home, CAD, CHF (recent hospitalization for exacerbation), CKD, chronic back pain, admitted on 10/18/2023 with SOB, neck pain. Found to be in septic shock- source unclear with renal failure with Cr 4.07 and K 6.1. Has been started on CRRT. Admission complicated by encephalopathy due to uremia and sepsis. Palliative consulted for goals of care.   Primary Decision Maker NEXT OF KIN - daughter Tona Francis  Discussion: Chart reviewed including labs, progress notes, imaging from this and previous encounters.  Patient has MOST form on chart with following selections:    Cardiopulmonary Resuscitation: Attempt Resuscitation (CPR)  Medical Interventions: Full Scope of Treatment: Use intubation, advanced airway interventions, mechanical ventilation, cardioversion as indicated, medical treatment, IV fluids, etc, also provide comfort measures. Transfer to the hospital if indicated  Antibiotics: Antibiotics if indicated  IV Fluids: IV fluids if indicated  Feeding Tube: Feeding tube for a defined trial period    Discussed with attending MD. There is concern for her overall survival due to her acute and chronic conditions and repeated hospitalizations.  On eval patient appears well nourished, in no distress. Mittens in place. She did not wake to my voice.  I called patient's daughter.  Introduced Palliative medicine. Palliative medicine is specialized medical care for people living with serious illness. It  focuses on providing relief from the symptoms and stress of a serious illness. The goal is to improve quality of life for both the patient and the family. Monica expressed feeling "nervous" about patient needing Palliative consult. I let her know that it is understandable to feel nervous- and her mother is very sick. I shared my role is to provide support to her and her Mom and advocate for their goals of care. I did also reinforce that her mom is critically ill and therefore there is a need to discuss her goals of care and advance care planning issues.  Monica agreed to goals of care discussion, unfortunately she's unable to meet until Saturday.     SUMMARY OF RECOMMENDATIONS  -Continue current interventions- patient has MOST form on chart- see above -Palliative will arrange a meeting for Saturday with patient's daughter is available for further discussion   Code Status/Advance Care Planning:   Code Status: Full Code    Prognosis:   Unable to determine  Discharge Planning: To Be Determined  Primary Diagnoses: Present on Admission:  Altered mental state  Acute kidney injury superimposed on stage 3a chronic kidney disease (HCC)  Combined systolic and diastolic congestive heart failure (HCC)  COPD (chronic obstructive pulmonary disease) (HCC)  Essential hypertension  RVF (right ventricular failure) (HCC)  Hyperkalemia  Acute  on chronic hypoxic respiratory failure (HCC)   Review of Systems  Physical Exam  Vital Signs: BP (!) 150/76   Pulse (!) 53   Temp (!) 97.2 F (36.2 C)   Resp (!) 23   Ht 5\' 5"  (1.651 m)   Wt 75.8 kg   SpO2 98%   BMI 27.81 kg/m  Pain Scale: 0-10   Pain Score: 0-No pain   SpO2: SpO2: 98 % O2 Device:SpO2: 98 % O2 Flow Rate: .O2 Flow Rate (L/min): 2 L/min  IO: Intake/output summary:  Intake/Output Summary (Last 24 hours) at 10/21/2023 1904 Last data filed at 10/21/2023 1820 Gross per 24 hour  Intake 1095.04 ml  Output 967.6 ml  Net 127.44 ml     LBM: Last BM Date : 10/21/23 Baseline Weight: Weight: 68 kg Most recent weight: Weight: 75.8 kg       Thank you for this consult. Palliative medicine will continue to follow and assist as needed.  Time Total: 60 mins Signed by: Micki Alas, AGNP-C Palliative Medicine  Time includes:   Preparing to see the patient (e.g., review of tests) Obtaining and/or reviewing separately obtained history Performing a medically necessary appropriate examination and/or evaluation Counseling and educating the patient/family/caregiver Ordering medications, tests, or procedures Referring and communicating with other health care professionals (when not reported separately) Documenting clinical information in the electronic or other health record Independently interpreting results (not reported separately) and communicating results to the patient/family/caregiver Care coordination (not reported separately) Clinical documentation   Please contact Palliative Medicine Team phone at [phone] for questions and concerns.  For individual provider: See Tilford Foley

## 2023-10-21 NOTE — Progress Notes (Signed)
 PHARMACY - ANTICOAGULATION CONSULT NOTE  Pharmacy Consult for Heparin   Indication: atrial fibrillation with mechanical mitral valve   Allergies  Allergen Reactions   Lisinopril  Swelling   Acetaminophen -Codeine Itching   Propoxyphene Itching    Darvocet   Tape Rash    Plastic   Tramadol  Itching, Nausea And Vomiting and Rash    Patient Measurements: Height: 5\' 5"  (165.1 cm) Weight: 75.8 kg (167 lb 1.7 oz) IBW/kg (Calculated) : 57 HEPARIN  DW (KG): 72.6  Vital Signs: Temp: 97.7 F (36.5 C) (05/01 0800) Temp Source: Bladder (05/01 0800) BP: 125/80 (05/01 0800) Pulse Rate: 54 (05/01 0800)  Labs: Recent Labs    10/19/23 0115 10/19/23 0122 10/19/23 0325 10/19/23 0523 10/19/23 0530 10/19/23 0600 10/19/23 0605 10/19/23 0632 10/19/23 1530 10/20/23 0229 10/20/23 1513 10/21/23 0213  HGB  --    < >  --   --  10.7*   < > 11.2*  --   --  10.1*  --  9.4*  HCT  --    < >  --   --  36.7   < > 33.0*  --   --  33.7*  --  31.5*  PLT  --   --   --   --  443*  --   --   --   --  448*  --  335  LABPROT 33.4*  --   --   --   --   --   --   --   --  44.8*  --  28.3*  INR 3.3*  --   --   --   --   --   --   --   --  4.7*  --  2.6*  CREATININE 4.04*   < >  --    < >  --   --   --   --    < > 1.75* 1.10* 0.87  CKTOTAL  --   --   --   --   --   --   --  78  --   --   --   --   TROPONINIHS 18*  --  17  --   --   --   --   --   --   --   --   --    < > = values in this interval not displayed.    Estimated Creatinine Clearance: 58.6 mL/min (by C-G formula based on SCr of 0.87 mg/dL).   Medical History: Past Medical History:  Diagnosis Date   Acute delirium 08/30/2023   Acute GI bleeding 08/18/2023   Arthritis    Asthma    Breast cancer (HCC) 2001   Left Breast Cancer   CHF (congestive heart failure) (HCC)    Closed fracture of right hip (HCC) 09/23/2022   Closed intertrochanteric fracture of right femur, initial encounter (HCC) 09/22/2022   COPD (chronic obstructive pulmonary  disease) (HCC)    Coronary artery disease    GERD (gastroesophageal reflux disease)    History of mitral valve prosthesis 07/31/2023   History of mitral valve replacement with mechanical valve    HLD (hyperlipidemia)    Hypertension    Personal history of breast cancer    Left Breast Cancer     Pulmonary embolism (HCC) 2021    Medications:  Scheduled:   (feeding supplement) PROSource Plus  30 mL Oral TID BM   arformoterol   15 mcg Nebulization BID   budesonide  (PULMICORT ) nebulizer  solution  0.5 mg Nebulization BID   Chlorhexidine  Gluconate Cloth  6 each Topical Q0600   feeding supplement  237 mL Oral BID BM   gabapentin   200 mg Oral TID   insulin  aspart  1-3 Units Subcutaneous Q4H   multivitamin  1 tablet Oral QHS   mouth rinse  15 mL Mouth Rinse 4 times per day   revefenacin   175 mcg Nebulization Daily   Infusions:   dexmedetomidine  (PRECEDEX ) IV infusion 1 mcg/kg/hr (10/21/23 0800)   famotidine  (PEPCID ) IV Stopped (10/20/23 0921)   norepinephrine  (LEVOPHED ) Adult infusion Stopped (10/20/23 1834)   piperacillin -tazobactam Stopped (10/21/23 0533)   prismasol  BGK 4/2.5 400 mL/hr at 10/20/23 2326   prismasol  BGK 4/2.5 400 mL/hr at 10/20/23 2324   prismasol  BGK 4/2.5 1,500 mL/hr at 10/21/23 0631    Assessment: Kim Anthony is a 73 y.o. year old female presented on 10/18/2023 with concern for AMS. On warfarin prior to admission for MVR and atrial fibrillation. Prior to admission regimen, 5 mg PO daily (total weekly dose 35 mg). Last dose prior to admission 10/17/23 PM. Pharmacy consulted for heparin  / warfarin management.   Transition to heparin  infusion today given likely need for procedures. Okay to start heparin  given trend of INR (4.7 >> 2.6 today) in discussion with CCM.   Goal of Therapy:  Heparin  level 0.3-0.7 units/ml INR 2.5-3.5   >> Warfarin on hold for possible procedures** Monitor platelets by anticoagulation protocol: Yes   Plan:  Start heparin  infusion at 1000  units/hr Check anti-Xa level in 8 hours and daily while on heparin  Continue to monitor H&H and platelets Daily PT/INR   Kim Anthony 10/21/2023,8:08 AM

## 2023-10-21 NOTE — Progress Notes (Signed)
 NAME:  Kim Anthony, [name]:  [id number], DOB:  29-Apr-1951, LOS: 2 ADMISSION DATE:  10/18/2023, CONSULTATION DATE: 10/19/2023 REFERRING MD: Internal medicine, CHIEF COMPLAINT: Worsening renal failure hypotension  History of Present Illness:  73 year old female with a history of breast cancer, COPD with continued tobacco, O2 dependent at 2 L, right heart failure, and atrial fibrillation on warfarin.  who presents with worsening renal failure potassium 6.1 white count of 17 hypotension and bradycardia.  She had calcium  gluconate for her elevated potassium sodium bicarbonate .  Her baseline creatinine is 1.85 currently is 4.07 potassium is 6.1.  She currently has been fluid resuscitated for hypotension and bradycardia is not on vasopressor support at this time.  She does have elevated white count therefore she is on empirical antimicrobial therapy she does open her eyes and follow commands at this time and protect her airway therefore does not need intubation at this time.  She has been scheduled for interventional radiology to place hemodialysis catheter we will continue gentle fluid resuscitation.  Pertinent  Medical History   Past Medical History:  Diagnosis Date   Acute delirium 08/30/2023   Acute GI bleeding 08/18/2023   Arthritis    Asthma    Breast cancer (HCC) 2001   Left Breast Cancer   CHF (congestive heart failure) (HCC)    Closed fracture of right hip (HCC) 09/23/2022   Closed intertrochanteric fracture of right femur, initial encounter (HCC) 09/22/2022   COPD (chronic obstructive pulmonary disease) (HCC)    Coronary artery disease    GERD (gastroesophageal reflux disease)    History of mitral valve prosthesis 07/31/2023   History of mitral valve replacement with mechanical valve    HLD (hyperlipidemia)    Hypertension    Personal history of breast cancer    Left Breast Cancer     Pulmonary embolism (HCC) 2021     Significant Hospital Events: Including procedures, antibiotic  start and stop dates in addition to other pertinent events   4/29 Admitted with renal failure, hypotension, HyperK. Admitted to ICU, CRRT. Pressors.  4/30 Precedex  started overnight. On 5 mcg NE. CRRT ongoing 5/1 - off pressors, but delirium is a bit worse today. Precedex  increasing.   Interim History / Subjective:  No acute events overnight.  Worsening delirium and confusion Off pressors Remains on CRRT - keeping even  Objective   Blood pressure 118/65, pulse (!) 53, temperature 98.1 F (36.7 C), resp. rate (!) 26, height 5\' 5"  (1.651 m), weight 75.8 kg, SpO2 95%.        Intake/Output Summary (Last 24 hours) at 10/21/2023 0710 Last data filed at 10/21/2023 0700 Gross per 24 hour  Intake 880.01 ml  Output 1041 ml  Net -160.99 ml   Filed Weights   10/19/23 1634 10/20/23 0500 10/21/23 0500  Weight: 75.7 kg 75.5 kg 75.8 kg    Examination:  General: elderly appearing female resting comfortably in bed HENT: [place]/AT, PERRL, no JVD Lungs: Clear, unlabored,  Cardiovascular: rate 58, regular Abdomen: Soft, NT, ND hypoactive.  Extremities: No acute deformity or edema. Moves all extremities. Neuro: Somnolent, but easily arouses to cooperate with exam.   GU: is making some urine, foley  Labs: K 3.9, BUN 15, Cr 0.87, WBC 11.6, Hgb 9.4, INR 2.6  Resolved Hospital Problem list     Assessment & Plan:   Acute on CKD 3b - CRRT per nephrology - Holding losartan , torsemide , farxiga  - Trend renal indices, I/O  Hypotension Chronic HFpEF - NE off - continue  to hold home GDMT with borderline pressors - PRN hydralazine   Mechanical valve on warfarin goal INR 2.5-3.5 Atrial fibrillation - INR improving, now down to 2.6 - Goal 2.5-3.5 - Will ask pharmacy to dose heparin    Leukocytosis: CXR and UA unrevealing. Parotid glands appear swollen but were recently worked up and were benign. Improving. MRSA swab negative. - Cultures pending - Continue Zosyn  - Narrow/DC as able.   Acute  metabolic encephalopathy in the setting of renal failure - Precedex  infusion, wean as able - Frequent re-orientation - Maintain good sleep/wake cycles - Resume home gabapentin  in case of withdrawal  Diabetes mellitus - Sliding scale insulin   COPD without exacerbation Chronic hypoxic respiratory failure with chronic 2L O2 need - O2 as needed - Triple neb therapy in place of home Trelegy   Best Practice (right click and "Reselect all SmartList Selections" daily)   Diet/type: dysphagia diet (see orders) and NPO DVT prophylaxis systemic heparin  - mech valve Pressure ulcer(s): N/A GI prophylaxis: PPI Lines: Dialysis Catheter and Arterial Line Foley:  Yes, and it is still needed Code Status:  full code Last date of multidisciplinary goals of care discussion [TBD]  Critical care time: 38 min     Roz Cornelia, AGACNP-BC [facility]  See Amion for personal pager PCCM on call pager [phone] until 7pm. Please call Elink 7p-7a. [phone]  10/21/2023 7:10 AM

## 2023-10-21 NOTE — Progress Notes (Signed)
 Speech Language Pathology Treatment: Dysphagia  Patient Details Name: Kim Anthony [name]: [id number] DOB: July 28, 1950 Today's Date: 10/21/2023 Time: 5621-3086 SLP Time Calculation (min) (ACUTE ONLY): 13 min  Assessment / Plan / Recommendation Clinical Impression  Pt seen for ongoing dysphagia management.  Daughter present for session.  RN reports continued dry, baseline cough, but no coughing with POs.  Pt asleep breathing through mouth, able to rouse with moderate stimulation.  Xerostomia noted.  SLP provided oral care.  Some red-brown coloring observed.  No oral bleeding reported.  On initial sip of water there was oral holding followed by some anterior loss (expectoration v poor bolus awareness), gurgling and immediate wet cough.  This is attributed to confusion after waking. Further trials of both thin liquid by straw and puree resulted in adequate oral transit and clearance of boluses and no further s/s of aspiration.  Coughing, presumed to be baseline, was much reduced today as compared to 4/30. Pt declined trials of more advanced solid textures.  Spoke with daughter who reports no difficulty with POs at baseline, even when pt does not wear dentures to eat. She has had decreased intake.  Will maintain current diet as more modified texture may be less appetizing and daughter agrees that pt would be less likely to eat a chopped/ground diet.  Daughter is in agreement of careful PO intake rather than proceeding with swallow study at this time as pt cannot transport to radiology for MBSS and would likely not tolerate FEES.  Clinical presentation today is reassuring, despite pt's decreased appetite.  Recommend mechanical soft solids with thin liquids.     HPI HPI: Kim Anthony is as 73 year old female who presents with worsening renal failure. CXR 4/29: "Mildly worsened bibasilar atelectasis. Small right pleural effusion, slightly  increased. Mild pulmonary edema, slightly worsened. No pneumothorax." Pt with  a history of breast cancer, COPD with continued tobacco, O2 dependent at 2 L, right heart failure, and atrial fibrillation on warfarin.      SLP Plan  Continue with current plan of care      Recommendations for follow up therapy are one component of a multi-disciplinary discharge planning process, led by the attending physician.  Recommendations may be updated based on patient status, additional functional criteria and insurance authorization.    Recommendations  Diet recommendations: Dysphagia 3 (mechanical soft);Thin liquid Liquids provided via: Cup;Straw Medication Administration: Crushed with puree Supervision: Trained caregiver to feed patient Compensations: Slow rate;Small sips/bites Postural Changes and/or Swallow Maneuvers: Seated upright 90 degrees                  Oral care BID     Dysphagia, unspecified (R13.10)     Continue with current plan of care     Elester Grim, MA, CCC-SLP Acute Rehabilitation Services Office: [phone] 10/21/2023, 9:23 AM

## 2023-10-21 NOTE — Progress Notes (Signed)
 Kim Anthony is an 73 y.o. female COPD, MVR on coumadin , atrial fibrillation, chronic constipation on chronic opioids, s/p lt mastectomy w breast cancer, CKD3b (BL 1.4-1.9)recently admitted with heart failure, pulmonary HTN with severely reduced RV function diuresed. H/O poor compliance with diet and diuretics, on home torsemide  80mg  daily and at time of discharge her Cr was 1.92. Now p/w SOB, somnolence, AMS, neck pain.   Assessment/Plan:  AKI on CKD3b (BL 1.4-1.9) discharged recently with a cr of 1.92 in the setting previously with severe right sided heart failure. Hyperkalemia treated medically and at time of consultation she had received Lokelma  10gm x1. CXR showed cardiomegaly but no vascular congestion.- Hold losartan , torsemide , farxiga . - Seen on CRRT (started 4/29) On 4K bath Appreciate CCM placing the RIJ temp  Plan on stopping CRRT 7AM Fri; already d/w RN and if VP high rinse back. Hopefully UOP picks up once CRRT stopped. D/w and updated daughter [name] who was bedside.   AMS possibly from sepsis with leukocytosis but certainly uremia could be contributing as well. Being treated with broad spectrum abx. Combined CHF with elevated BNP and h/o noncompliance and discharged on torsemide  80mg  daily from recent hospitalization.  Atrial fibrillation  heparin  gtt, subtherapeutic INR prior discharge recently but initial INR 3.3. Now 2.6 HTN COPD  Subjective: Cooperative but at times angry, asking when she's going home. Denies f/c/n/v.    Chemistry and CBC: Creatinine, Ser  Date/Time Value Ref Range Status  10/21/2023 02:13 AM 0.87 0.44 - 1.00 mg/dL Final  57/84/6962 95:28 PM 1.10 (H) 0.44 - 1.00 mg/dL Final  41/32/4401 02:72 AM 1.75 (H) 0.44 - 1.00 mg/dL Final    Comment:    DELTA CHECK NOTED  10/19/2023 03:30 PM 3.88 (H) 0.44 - 1.00 mg/dL Final  53/66/4403 47:42 AM 4.07 (H) 0.44 - 1.00 mg/dL Final  59/56/3875 64:33 AM 4.10 (H) 0.44 - 1.00 mg/dL Final  29/51/8841 66:06 AM 4.04 (H)  0.44 - 1.00 mg/dL Final  30/16/0109 32:35 PM 1.85 (H) 0.57 - 1.00 mg/dL Final  57/32/2025 42:70 AM 1.92 (H) 0.44 - 1.00 mg/dL Final  62/37/6283 15:17 PM 2.13 (H) 0.44 - 1.00 mg/dL Final  61/60/7371 06:26 AM 2.33 (H) 0.44 - 1.00 mg/dL Final  94/85/4627 03:50 AM 1.73 (H) 0.44 - 1.00 mg/dL Final  09/38/1829 93:71 AM 1.47 (H) 0.44 - 1.00 mg/dL Final  69/67/8938 10:17 AM 1.43 (H) 0.44 - 1.00 mg/dL Final  51/07/5850 77:82 AM 1.39 (H) 0.44 - 1.00 mg/dL Final  42/35/3614 43:15 AM 1.97 (H) 0.44 - 1.00 mg/dL Final  40/01/6760 95:09 AM 2.04 (H) 0.44 - 1.00 mg/dL Final  32/67/1245 80:99 AM 1.91 (H) 0.44 - 1.00 mg/dL Final  83/38/2505 39:76 AM 1.94 (H) 0.44 - 1.00 mg/dL Final  73/41/9379 02:40 AM 2.64 (H) 0.44 - 1.00 mg/dL Final  97/35/3299 24:26 AM 2.70 (H) 0.44 - 1.00 mg/dL Final  83/41/9622 29:79 PM 2.67 (H) 0.44 - 1.00 mg/dL Final  89/21/1941 74:08 AM 2.23 (H) 0.44 - 1.00 mg/dL Final  14/48/1856 31:49 AM 1.46 (H) 0.44 - 1.00 mg/dL Final  70/26/3785 88:50 AM 1.85 (H) 0.44 - 1.00 mg/dL Final  27/74/1287 86:76 AM 1.66 (H) 0.44 - 1.00 mg/dL Final  72/02/4708 62:83 AM 1.92 (H) 0.44 - 1.00 mg/dL Final  66/29/4765 46:50 AM 1.76 (H) 0.44 - 1.00 mg/dL Final  35/46/5681 27:51 AM 1.92 (H) 0.44 - 1.00 mg/dL Final  70/06/7492 49:67 AM 1.88 (H) 0.44 - 1.00 mg/dL Final  59/16/3846 65:99 AM 1.41 (H) 0.44 - 1.00  mg/dL Final  16/03/9603 54:09 AM 1.46 (H) 0.44 - 1.00 mg/dL Final  81/19/1478 29:56 PM 1.45 (H) 0.44 - 1.00 mg/dL Final  21/30/8657 84:69 AM 1.32 (H) 0.44 - 1.00 mg/dL Final  62/95/2841 32:44 PM 1.39 (H) 0.44 - 1.00 mg/dL Final  06/24/7251 66:44 AM 1.23 (H) 0.44 - 1.00 mg/dL Final  03/47/4259 56:38 PM 1.40 (H) 0.44 - 1.00 mg/dL Final  75/64/3329 51:88 AM 1.32 (H) 0.44 - 1.00 mg/dL Final  41/66/0630 16:01 AM 1.30 (H) 0.44 - 1.00 mg/dL Final  09/32/3557 32:20 AM 1.05 (H) 0.44 - 1.00 mg/dL Final  25/42/7062 37:62 PM 0.87 0.44 - 1.00 mg/dL Final  83/15/1761 60:73 AM 0.85 0.44 - 1.00 mg/dL Final   71/11/2692 85:46 PM 0.97 0.44 - 1.00 mg/dL Final  27/08/5007 38:18 PM 0.93 0.44 - 1.00 mg/dL Final  29/93/7169 67:89 AM 1.02 (H) 0.44 - 1.00 mg/dL Final  38/03/1750 02:58 PM 1.18 (H) 0.44 - 1.00 mg/dL Final  52/77/8242 35:36 AM 1.47 (H) 0.44 - 1.00 mg/dL Final  14/43/1540 08:67 AM 2.17 (H) 0.44 - 1.00 mg/dL Final  61/95/0932 67:12 PM 2.81 (H) 0.44 - 1.00 mg/dL Final  45/80/9983 38:25 AM 3.12 (H) 0.44 - 1.00 mg/dL Final  05/39/7673 41:93 AM 4.27 (H) 0.44 - 1.00 mg/dL Final  79/07/4095 35:32 AM 4.34 (H) 0.44 - 1.00 mg/dL Final   Recent Labs  Lab 10/19/23 0115 10/19/23 0122 10/19/23 0523 10/19/23 0600 10/19/23 0605 10/19/23 1530 10/20/23 0229 10/20/23 1513 10/21/23 0213  NA 129* 126* 128* 130* 129* 131* 133* 134* 135  K 5.8* 5.6* 6.1* 5.7* 6.5* 5.2* 4.2 3.9 3.9  CL 98 103 100  --   --  100 102 103 104  CO2 18*  --  17*  --   --  19* 22 23 23   GLUCOSE 88 86 90  --   --  87 107* 110* 122*  BUN 104* 111* 102*  --   --  102* 45* 23 15  CREATININE 4.04* 4.10* 4.07*  --   --  3.88* 1.75* 1.10* 0.87  CALCIUM  9.1  --  8.9  --   --  8.8* 9.5 8.7* 8.6*  PHOS  --   --  7.9*  --   --  7.9* 3.3 2.1* 2.0*   Recent Labs  Lab 10/18/23 2342 10/19/23 0122 10/19/23 0530 10/19/23 0600 10/19/23 0605 10/20/23 0229 10/21/23 0213  WBC 19.7*  --  17.5*  --   --  15.1* 11.6*  HGB 10.3*   < > 10.7* 11.2* 11.2* 10.1* 9.4*  HCT 38.3   < > 36.7 33.0* 33.0* 33.7* 31.5*  MCV 99.2  --  92.4  --   --  90.1 88.7  PLT 417*  --  443*  --   --  448* 335   < > = values in this interval not displayed.   Liver Function Tests: Recent Labs  Lab 10/19/23 0523 10/19/23 1530 10/20/23 0229 10/20/23 1513 10/21/23 0213  AST 18  --   --   --  19  ALT 20  --   --   --  17  ALKPHOS 108  --   --   --  143*  BILITOT 0.8  --   --   --  1.4*  PROT 6.9  --   --   --  6.1*  ALBUMIN 3.5  3.5   < > 2.9* 2.6* 2.7*   < > = values in this interval not displayed.   No results  for input(s): "LIPASE", "AMYLASE" in the  last 168 hours. No results for input(s): "AMMONIA" in the last 168 hours. Cardiac Enzymes: Recent Labs  Lab 10/19/23 0632  CKTOTAL 78   Iron  Studies: No results for input(s): "IRON ", "TIBC", "TRANSFERRIN", "FERRITIN" in the last 72 hours. PT/INR: @LABRCNTIP (inr:5)  Xrays/Other Studies: ) Results for orders placed or performed during the hospital encounter of 10/18/23 (from the past 48 hours)  MRSA Next Gen by PCR, Nasal     Status: None   Collection Time: 10/19/23 11:40 AM   Specimen: Nasal Mucosa; Nasal Swab  Result Value Ref Range   MRSA by PCR Next Gen NOT DETECTED NOT DETECTED    Comment: (NOTE) The GeneXpert MRSA Assay (FDA approved for NASAL specimens only), is one component of a comprehensive MRSA colonization surveillance program. It is not intended to diagnose MRSA infection nor to guide or monitor treatment for MRSA infections. Test performance is not FDA approved in patients less than 52 years old. Performed at [facility], [address] [place], [place] [address]   Glucose, capillary     Status: Abnormal   Collection Time: 10/19/23 11:42 AM  Result Value Ref Range   Glucose-Capillary 34 (LL) 70 - 99 mg/dL    Comment: Glucose reference range applies only to samples taken after fasting for at least 8 hours.   Comment 1 Notify RN   Glucose, capillary     Status: Abnormal   Collection Time: 10/19/23 11:44 AM  Result Value Ref Range   Glucose-Capillary 39 (LL) 70 - 99 mg/dL    Comment: Glucose reference range applies only to samples taken after fasting for at least 8 hours.   Comment 1 Notify RN   Glucose, capillary     Status: None   Collection Time: 10/19/23 12:09 PM  Result Value Ref Range   Glucose-Capillary 89 70 - 99 mg/dL    Comment: Glucose reference range applies only to samples taken after fasting for at least 8 hours.  Glucose, capillary     Status: Abnormal   Collection Time: 10/19/23 12:44 PM  Result Value Ref Range   Glucose-Capillary 107  (H) 70 - 99 mg/dL    Comment: Glucose reference range applies only to samples taken after fasting for at least 8 hours.  Renal function panel (daily at 1600)     Status: Abnormal   Collection Time: 10/19/23  3:30 PM  Result Value Ref Range   Sodium 131 (L) 135 - 145 mmol/L   Potassium 5.2 (H) 3.5 - 5.1 mmol/L   Chloride 100 98 - 111 mmol/L   CO2 19 (L) 22 - 32 mmol/L   Glucose, Bld 87 70 - 99 mg/dL    Comment: Glucose reference range applies only to samples taken after fasting for at least 8 hours.   BUN 102 (H) 8 - 23 mg/dL   Creatinine, Ser 6.44 (H) 0.44 - 1.00 mg/dL   Calcium  8.8 (L) 8.9 - 10.3 mg/dL   Phosphorus  7.9 (H) 2.5 - 4.6 mg/dL   Albumin 3.0 (L) 3.5 - 5.0 g/dL   GFR, Estimated 12 (L) >60 mL/min    Comment: (NOTE) Calculated using the CKD-EPI Creatinine Equation (2021)    Anion gap 12 5 - 15    Comment: Performed at [facility], [address] [place], [place] [address]  Hepatitis B surface antibody,quantitative     Status: Abnormal   Collection Time: 10/19/23  3:30 PM  Result Value Ref Range  Hep B S AB Quant (Post) <3.5 (L) Immunity>10 mIU/mL    Comment: (NOTE)  Status of Immunity                     Anti-HBs Level  ------------------                     -------------- Inconsistent with Immunity                  0.0 - 10.0 Consistent with Immunity                         >10.0 Performed At: [facility] [address] [place], [place] [id number] Pearlean Botts MD MW:[phone]   Glucose, capillary     Status: None   Collection Time: 10/19/23  3:38 PM  Result Value Ref Range   Glucose-Capillary 80 70 - 99 mg/dL    Comment: Glucose reference range applies only to samples taken after fasting for at least 8 hours.  Glucose, capillary     Status: Abnormal   Collection Time: 10/19/23  7:20 PM  Result Value Ref Range   Glucose-Capillary 55 (L) 70 - 99 mg/dL    Comment: Glucose reference range applies only to samples taken after fasting for at least 8  hours.  Glucose, capillary     Status: None   Collection Time: 10/19/23  7:22 PM  Result Value Ref Range   Glucose-Capillary 95 70 - 99 mg/dL    Comment: Glucose reference range applies only to samples taken after fasting for at least 8 hours.  Glucose, capillary     Status: Abnormal   Collection Time: 10/19/23 11:29 PM  Result Value Ref Range   Glucose-Capillary 105 (H) 70 - 99 mg/dL    Comment: Glucose reference range applies only to samples taken after fasting for at least 8 hours.  Protime-INR     Status: Abnormal   Collection Time: 10/20/23  2:29 AM  Result Value Ref Range   Prothrombin  Time 44.8 (H) 11.4 - 15.2 seconds   INR 4.7 (HH) 0.8 - 1.2    Comment: REPEATED TO VERIFY CRITICAL RESULT CALLED TO, READ BACK BY AND VERIFIED WITH: Derrick Fling RN @0330  10/20/23 SATRAINR (NOTE) INR goal varies based on device and disease states. Performed at [facility], [address], [place], [place] [address]   Renal function panel (daily at 0500)     Status: Abnormal   Collection Time: 10/20/23  2:29 AM  Result Value Ref Range   Sodium 133 (L) 135 - 145 mmol/L   Potassium 4.2 3.5 - 5.1 mmol/L   Chloride 102 98 - 111 mmol/L   CO2 22 22 - 32 mmol/L   Glucose, Bld 107 (H) 70 - 99 mg/dL    Comment: Glucose reference range applies only to samples taken after fasting for at least 8 hours.   BUN 45 (H) 8 - 23 mg/dL   Creatinine, Ser 6.44 (H) 0.44 - 1.00 mg/dL    Comment: DELTA CHECK NOTED   Calcium  9.5 8.9 - 10.3 mg/dL   Phosphorus  3.3 2.5 - 4.6 mg/dL   Albumin 2.9 (L) 3.5 - 5.0 g/dL   GFR, Estimated 30 (L) >60 mL/min    Comment: (NOTE) Calculated using the CKD-EPI Creatinine Equation (2021)    Anion gap 9 5 - 15    Comment: Performed at [facility], [address], [place], [place] [address]  Magnesium      Status: None   Collection Time: 10/20/23  2:29 AM  Result Value Ref Range   Magnesium  2.0 1.7 - 2.4 mg/dL    Comment: Performed at [facility], [address] [place], [place] [address]  CBC     Status: Abnormal   Collection Time: 10/20/23  2:29 AM  Result Value Ref Range   WBC 15.1 (H) 4.0 - 10.5 K/uL   RBC 3.74 (L) 3.87 - 5.11 MIL/uL   Hemoglobin 10.1 (L) 12.0 - 15.0 g/dL   HCT 65.7 (L) 84.6 - 96.2 %   MCV 90.1 80.0 - 100.0 fL   MCH 27.0 26.0 - 34.0 pg   MCHC 30.0 30.0 - 36.0 g/dL   RDW 95.2 (H) 84.1 - 32.4 %   Platelets 448 (H) 150 - 400 K/uL   nRBC 0.2 0.0 - 0.2 %    Comment: Performed at [facility], [address] [place], [place] [address]  Glucose, capillary     Status: Abnormal   Collection Time: 10/20/23  3:55 AM  Result Value Ref Range   Glucose-Capillary 113 (H) 70 - 99 mg/dL    Comment: Glucose reference range applies only to samples taken after fasting for at least 8 hours.  Glucose, capillary     Status: Abnormal   Collection Time: 10/20/23  7:23 AM  Result Value Ref Range   Glucose-Capillary 107 (H) 70 - 99 mg/dL    Comment: Glucose reference range applies only to samples taken after fasting for at least 8 hours.  Glucose, capillary     Status: Abnormal   Collection Time: 10/20/23 11:15 AM  Result Value Ref Range   Glucose-Capillary 109 (H) 70 - 99 mg/dL    Comment: Glucose reference range applies only to samples taken after fasting for at least 8 hours.  Glucose, capillary     Status: Abnormal   Collection Time: 10/20/23  3:10 PM  Result Value Ref Range   Glucose-Capillary 113 (H) 70 - 99 mg/dL    Comment: Glucose reference range applies only to samples taken after fasting for at least 8 hours.  Renal function panel (daily at 1600)     Status: Abnormal   Collection Time: 10/20/23  3:13 PM  Result Value Ref Range   Sodium 134 (L) 135 - 145 mmol/L   Potassium 3.9 3.5 - 5.1 mmol/L   Chloride 103 98 - 111 mmol/L   CO2 23 22 - 32 mmol/L   Glucose, Bld 110 (H) 70 - 99 mg/dL    Comment: Glucose reference range applies only to samples taken after fasting for at least 8 hours.   BUN 23 8 - 23 mg/dL    Creatinine, Ser 7.25 (H) 0.44 - 1.00 mg/dL   Calcium  8.7 (L) 8.9 - 10.3 mg/dL   Phosphorus  2.1 (L) 2.5 - 4.6 mg/dL   Albumin 2.6 (L) 3.5 - 5.0 g/dL   GFR, Estimated 53 (L) >60 mL/min    Comment: (NOTE) Calculated using the CKD-EPI Creatinine Equation (2021)    Anion gap 8 5 - 15    Comment: Performed at [facility], [address], [place], [place] [address]  Glucose, capillary     Status: Abnormal   Collection Time: 10/20/23  7:30 PM  Result Value Ref Range   Glucose-Capillary 127 (H) 70 - 99 mg/dL    Comment: Glucose reference range applies only to samples taken after fasting for at least 8 hours.  Glucose, capillary     Status: None   Collection Time: 10/20/23 11:18 PM  Result Value Ref Range   Glucose-Capillary 95 70 - 99 mg/dL    Comment: Glucose reference range applies only to samples taken after fasting for at least 8 hours.  Protime-INR     Status: Abnormal   Collection Time: 10/21/23  2:13 AM  Result Value Ref Range   Prothrombin  Time 28.3 (H) 11.4 - 15.2 seconds   INR 2.6 (H) 0.8 - 1.2    Comment: (NOTE) INR goal varies based on device and disease states. Performed at [facility], [address] [place], [place] [address]   Magnesium      Status: None   Collection Time: 10/21/23  2:13 AM  Result Value Ref Range   Magnesium  2.2 1.7 - 2.4 mg/dL    Comment: Performed at [facility], [address], [place], [place] [address]  CBC     Status: Abnormal   Collection Time: 10/21/23  2:13 AM  Result Value Ref Range   WBC 11.6 (H) 4.0 - 10.5 K/uL   RBC 3.55 (L) 3.87 - 5.11 MIL/uL   Hemoglobin 9.4 (L) 12.0 - 15.0 g/dL   HCT 14.7 (L) 82.9 - 56.2 %   MCV 88.7 80.0 - 100.0 fL   MCH 26.5 26.0 - 34.0 pg   MCHC 29.8 (L) 30.0 - 36.0 g/dL   RDW 13.0 (H) 86.5 - 78.4 %   Platelets 335 150 - 400 K/uL   nRBC 0.3 (H) 0.0 - 0.2 %    Comment: Performed at [facility], [address], [place], [place] [address]  Comprehensive metabolic panel with GFR      Status: Abnormal   Collection Time: 10/21/23  2:13 AM  Result Value Ref Range   Sodium 135 135 - 145 mmol/L   Potassium 3.9 3.5 - 5.1 mmol/L   Chloride 104 98 - 111 mmol/L   CO2 23 22 - 32 mmol/L   Glucose, Bld 122 (H) 70 - 99 mg/dL    Comment: Glucose reference range applies only to samples taken after fasting for at least 8 hours.   BUN 15 8 - 23 mg/dL   Creatinine, Ser 5.28 0.44 - 1.00 mg/dL   Calcium  8.6 (L) 8.9 - 10.3 mg/dL   Total Protein 6.1 (L) 6.5 - 8.1 g/dL   Albumin 2.7 (L) 3.5 - 5.0 g/dL   AST 19 15 - 41 U/L   ALT 17 0 - 44 U/L   Alkaline Phosphatase 143 (H) 38 - 126 U/L   Total Bilirubin 1.4 (H) 0.0 - 1.2 mg/dL   GFR, Estimated >41 >32 mL/min    Comment: (NOTE) Calculated using the CKD-EPI Creatinine Equation (2021)    Anion gap 8 5 - 15    Comment: Performed at [facility], [address], [place], [place] [address]  Phosphorus      Status: Abnormal   Collection Time: 10/21/23  2:13 AM  Result Value Ref Range   Phosphorus  2.0 (L) 2.5 - 4.6 mg/dL    Comment: Performed at [facility], [address], [place], [place] [address]  Glucose, capillary     Status: Abnormal   Collection Time: 10/21/23  5:11 AM  Result Value Ref Range   Glucose-Capillary 119 (H) 70 - 99 mg/dL    Comment: Glucose reference range applies only to samples taken after fasting for at least 8 hours.  Glucose, capillary  Status: Abnormal   Collection Time: 10/21/23  7:18 AM  Result Value Ref Range   Glucose-Capillary 61 (L) 70 - 99 mg/dL    Comment: Glucose reference range applies only to samples taken after fasting for at least 8 hours.  Glucose, capillary     Status: Abnormal   Collection Time: 10/21/23  7:48 AM  Result Value Ref Range   Glucose-Capillary 140 (H) 70 - 99 mg/dL    Comment: Glucose reference range applies only to samples taken after fasting for at least 8 hours.   *Note: Due to a large number of results and/or encounters for the requested time period, some  results have not been displayed. A complete set of results can be found in Results Review.   DG CHEST PORT 1 VIEW Result Date: 10/19/2023 EXAM: 1 VIEW XRAY OF THE CHEST 10/19/2023 01:17:00 PM COMPARISON: None available. CLINICAL HISTORY: Encounter for central line placement. FINDINGS: LUNGS AND PLEURA: Mildly worsened bibasilar atelectasis. Small right pleural effusion, slightly increased. Mild pulmonary edema, slightly worsened. No pneumothorax. HEART AND MEDIASTINUM: Moderate cardiomegaly, stable. Mitral annuloplasty being in place. BONES AND SOFT TISSUES: Intact sternotomy wires. Cervical clips overlie the left axilla. No acute osseous abnormality. LINES AND TUBES: Right internal jugular central venous catheter terminates in the lower third of the SVC. IMPRESSION: 1. Right internal jugular central venous catheter terminates in the lower third of the SVC. No pneumothorax. 2. Moderate cardiomegaly, stable. Mild pumonary edema, slightly worsened. 3. Small right pleural effusion, slightly increased. 4. Mildly worsened bibasilar atelectasis. Electronically signed by: [email or web address] 10/19/2023 02:35 PM EDT RP Workstation: [id number]   US  RENAL Result Date: 10/19/2023 CLINICAL DATA:  Acute renal failure EXAM: RENAL / URINARY TRACT ULTRASOUND COMPLETE COMPARISON:  Ultrasound abdomen complete 08/19/2023 FINDINGS: Right Kidney: Renal measurements: 10.5 x 5.2 x 4.7 cm = volume: 134 mL. Mild increased echogenicity of the renal cortex without significant thinning. No mass or hydronephrosis visualized. Left Kidney: Renal measurements: 10.0 x 5.8 x 5.5 cm = volume: 165 mL. Mild increased echogenicity of the renal cortex without significant thinning. No mass or hydronephrosis visualized. Bladder: Appears normal for degree of bladder distention. Other: None. IMPRESSION: Mild increased echogenicity of the renal cortex without significant thinning suggestive of chronic medical renal disease. Electronically Signed   By: Elester Grim  M.D.   On: 10/19/2023 09:59    PMH:   Past Medical History:  Diagnosis Date   Acute delirium 08/30/2023   Acute GI bleeding 08/18/2023   Arthritis    Asthma    Breast cancer (HCC) 2001   Left Breast Cancer   CHF (congestive heart failure) (HCC)    Closed fracture of right hip (HCC) 09/23/2022   Closed intertrochanteric fracture of right femur, initial encounter (HCC) 09/22/2022   COPD (chronic obstructive pulmonary disease) (HCC)    Coronary artery disease    GERD (gastroesophageal reflux disease)    History of mitral valve prosthesis 07/31/2023   History of mitral valve replacement with mechanical valve    HLD (hyperlipidemia)    Hypertension    Personal history of breast cancer    Left Breast Cancer     Pulmonary embolism (HCC) 2021    PSH:   Past Surgical History:  Procedure Laterality Date   APPLICATION OF A-CELL OF CHEST/ABDOMEN Left 01/27/2019   Procedure: APPLICATION OF A-CELL OF CHEST;  Surgeon: Thornell Flirt, DO;  Location: [facility] OR;  Service: Plastics;  Laterality: Left;   APPLICATION OF WOUND VAC N/A 01/27/2019   Procedure:  APPLICATION OF WOUND VAC;  Surgeon: Thornell Flirt, DO;  Location: [facility] OR;  Service: Plastics;  Laterality: N/A;   BREAST SURGERY Left 2001   for CA   CARDIOVERSION N/A 02/26/2022   Procedure: CARDIOVERSION;  Surgeon: Wendie Hamburg, MD;  Location: [facility] ENDOSCOPY;  Service: Cardiovascular;  Laterality: N/A;   COLONOSCOPY WITH PROPOFOL  N/A 06/07/2018   Procedure: COLONOSCOPY WITH PROPOFOL ;  Surgeon: Baldo Bonds, MD;  Location: [facility] ENDOSCOPY;  Service: Endoscopy;  Laterality: N/A;   DEBRIDEMENT AND CLOSURE WOUND Left 12/28/2018   Procedure: Debridement And Closure Wound;  Surgeon: Oza Blumenthal, MD;  Location: [facility] OR;  Service: General;  Laterality: Left;   ESOPHAGOGASTRODUODENOSCOPY (EGD) WITH PROPOFOL  N/A 08/24/2023   Procedure: ESOPHAGOGASTRODUODENOSCOPY (EGD) WITH PROPOFOL ;  Surgeon: Renaye Carp, DO;  Location:  [facility] ENDOSCOPY;  Service: Gastroenterology;  Laterality: N/A;   INCISION AND DRAINAGE OF WOUND Left 01/27/2019   Procedure: IRRIGATION AND DEBRIDEMENT OF CHEST WALL;  Surgeon: Thornell Flirt, DO;  Location: [facility] OR;  Service: Plastics;  Laterality: Left;   INTRAMEDULLARY (IM) NAIL INTERTROCHANTERIC N/A 09/23/2022   Procedure: INTRAMEDULLARY (IM) NAIL INTERTROCHANTERIC;  Surgeon: Wes Hamman, MD;  Location: [facility] OR;  Service: Orthopedics;  Laterality: N/A;   IR FLUORO GUIDE CV LINE RIGHT  10/01/2023   IR US  GUIDE VASC ACCESS RIGHT  10/01/2023   IR VENO/EXT/UNI RIGHT  10/05/2023   KNEE ARTHROSCOPY WITH MEDIAL MENISECTOMY Left 08/06/2021   Procedure: KNEE ARTHROSCOPY WITH PARTIAL MEDIAL MENISECTOMY, DEBRIDEMENT;  Surgeon: Orvan Blanch, MD;  Location: [facility] ORS;  Service: Orthopedics;  Laterality: Left;  1 HR   LATISSIMUS FLAP TO BREAST Left 01/18/2019   Procedure: LEFT LATISSIMUS FLAP TO BREAST;  Surgeon: Thornell Flirt, DO;  Location: [facility] OR;  Service: Plastics;  Laterality: Left;   MASTECTOMY Left    MITRAL VALVE REPLACEMENT     mechanical   POLYPECTOMY  06/07/2018   Procedure: POLYPECTOMY;  Surgeon: Baldo Bonds, MD;  Location: [facility] ENDOSCOPY;  Service: Endoscopy;;   RIGHT HEART CATH N/A 08/20/2023   Procedure: RIGHT HEART CATH;  Surgeon: Mardell Shade, MD;  Location: [facility] INVASIVE CV LAB;  Service: Cardiovascular;  Laterality: N/A;   RIGHT/LEFT HEART CATH AND CORONARY ANGIOGRAPHY N/A 01/09/2021   Procedure: RIGHT/LEFT HEART CATH AND CORONARY ANGIOGRAPHY;  Surgeon: Mardell Shade, MD;  Location: [facility] INVASIVE CV LAB;  Service: Cardiovascular;  Laterality: N/A;   WOUND DEBRIDEMENT Left 12/28/2018   Procedure: EXCISION OF LEFT CHRONIC CHEST WALL WOUND;  Surgeon: Oza Blumenthal, MD;  Location: [facility] OR;  Service: General;  Laterality: Left;    Allergies:  Allergies  Allergen Reactions   Lisinopril  Swelling   Acetaminophen -Codeine Itching   Propoxyphene Itching    Darvocet    Tape Rash    Plastic   Tramadol  Itching, Nausea And Vomiting and Rash    Medications:   Prior to Admission medications   Medication Sig Start Date End Date Taking? Authorizing Provider  albuterol  (VENTOLIN  HFA) 108 (90 Base) MCG/ACT inhaler Inhale 1 puff into the lungs every 4 (four) hours as needed for wheezing or shortness of breath. 09/14/23  Yes Juberg, Christopher, DO  colchicine  0.6 MG tablet Take 0.5 tablets (0.3 mg total) by mouth daily. 10/08/23  Yes Cleven Dallas, DO  FARXIGA  10 MG TABS tablet Take 1 tablet by mouth once daily 08/31/23  Yes Amoako, Prince, MD  fluticasone  (FLONASE ) 50 MCG/ACT nasal spray Place 2 sprays into both nostrils daily. Patient taking differently: Place 1 spray into both nostrils  daily. 05/17/23 05/16/24 Yes [name], MD  Fluticasone -Umeclidin-Vilant (TRELEGY ELLIPTA ) 100-62.5-25 MCG/ACT AEPB Inhale 1 puff into the lungs daily. 09/14/23  Yes Carleen Chary, DO  gabapentin  (NEURONTIN ) 100 MG capsule Take 2 capsules (200 mg total) by mouth 3 (three) times daily. 10/08/23  Yes Cleven Dallas, DO  HYDROcodone -acetaminophen  (NORCO/VICODIN) 5-325 MG tablet Take 1 tablet by mouth every 4 (four) hours as needed for moderate pain (pain score 4-6). Diagnosis- chronic low back pain 09/17/23  Yes Lovorn, Megan, MD  hydrOXYzine  (ATARAX ) 10 MG tablet TAKE 1 TABLET BY MOUTH THREE TIMES DAILY AS NEEDED FOR ANXIETY 10/18/23  Yes Amoako, Prince, MD  losartan  (COZAAR ) 25 MG tablet Take 0.5 tablets (12.5 mg total) by mouth daily. 08/10/23 11/08/23 Yes Milford, Arlice Bene, FNP  metoprolol  succinate (TOPROL -XL) 25 MG 24 hr tablet Take 1 tablet (25 mg total) by mouth 2 (two) times daily. 08/01/23  Yes Jayson Michael, MD  potassium chloride  (KLOR-CON  M) 10 MEQ tablet Take 2 tablets (20 mEq total) by mouth daily. 09/07/23 04/04/24 Yes Amoako, Prince, MD  senna-docusate (SENOKOT-S) 8.6-50 MG tablet Take 1 tablet by mouth at bedtime as needed for mild constipation. 10/08/23  Yes Cleven Dallas, DO  simvastatin  (ZOCOR ) 20 MG tablet Take 1 tablet (20 mg total) by mouth daily. 12/03/22  Yes Allana Arabia, MD  torsemide  (DEMADEX ) 20 MG tablet Take 4 tablets (80 mg total) by mouth daily. 10/08/23  Yes Cleven Dallas, DO  Vitamin D , Ergocalciferol , (DRISDOL ) 1.25 MG (50000 UNIT) CAPS capsule Take 1 capsule (50,000 Units total) by mouth every 7 (seven) days. 10/11/23  Yes Cleven Dallas, DO  warfarin (COUMADIN ) 5 MG tablet Take 1 tablet (5 mg total) by mouth daily at 4 PM. 10/08/23  Yes Cleven Dallas, DO  allopurinol  (ZYLOPRIM ) 100 MG tablet Take 0.5 tablets (50 mg total) by mouth every other day. Patient not taking: Reported on 10/19/2023 10/13/23   Malen Scudder, DO    Discontinued Meds:   Medications Discontinued During This Encounter  Medication Reason   warfarin (COUMADIN ) tablet 5 mg    warfarin (COUMADIN ) tablet 5 mg    Warfarin - Pharmacist Dosing Inpatient    piperacillin -tazobactam (ZOSYN ) IVPB 2.25 g    vancomycin  variable dose per unstable renal function (pharmacist dosing)    0.9 %  sodium chloride  infusion    vancomycin  (VANCOCIN ) IVPB 1000 mg/200 mL premix    budeson-glycopyrrolate -formoterol  (BREZTRI ) 160-9-4.8 MCG/ACT inhaler 2 puff    ipratropium-albuterol  (DUONEB) 0.5-2.5 (3) MG/3ML nebulizer solution 3 mL    sodium zirconium cyclosilicate  (LOKELMA ) packet 10 g    PrismaSol  BGK 2/3.5 infusion    PrismaSol  BGK 2/3.5 infusion    PrismaSol  BGK 2/3.5 infusion    famotidine  (PEPCID ) IVPB 20 mg premix     Social History:  reports that she has been smoking cigarettes. She has a 3 pack-year smoking history. She has never used smokeless tobacco. She reports current alcohol use of about 3.0 standard drinks of alcohol per week. She reports that she does not use drugs.  Family History:   Family History  Problem Relation Age of Onset   Hypertension Mother    Cancer Father    Hypertension Father    Breast cancer Maternal Aunt 50   Heart attack Other     Blood  pressure 125/80, pulse (!) 54, temperature 97.7 F (36.5 C), temperature source Bladder, resp. rate (!) 31, height 5\' 5"  (1.651 m), weight 75.8 kg, SpO2 96%. Physical Exam: General appearance: NAD, answering questions Head: NCAT  Back: No CVA tenderness. Cardio: bradycardic GI: SNDNT+BS Extremities: edema none Pulses: 2+ and symmetric Skin: Skin color, texture, turgor normal. No rashes or lesions Access: RIJ temp    Patrick Boor, MD 10/21/2023, 8:55 AM

## 2023-10-21 NOTE — Progress Notes (Signed)
 PCCM INTERVAL PROGRESS NOTE   Updated Kim Anthony  Kim Anthony still experiencing delirium despite Precedex  infusion. Attempting to climb out of bed and pull at lines. Will try low dose Seroquel  tonight in addition to resuming her gabapentin  this morning. Continue to monitor QTC.     Roz Cornelia, AGACNP-BC [facility]  See Amion for personal pager PCCM on call pager [phone] until 7pm. Please call Elink 7p-7a. [phone]  10/21/2023 6:43 PM

## 2023-10-22 ENCOUNTER — Encounter (HOSPITAL_COMMUNITY)

## 2023-10-22 DIAGNOSIS — N179 Acute kidney failure, unspecified: Secondary | ICD-10-CM | POA: Diagnosis not present

## 2023-10-22 DIAGNOSIS — A419 Sepsis, unspecified organism: Secondary | ICD-10-CM | POA: Diagnosis not present

## 2023-10-22 DIAGNOSIS — N1832 Chronic kidney disease, stage 3b: Secondary | ICD-10-CM | POA: Diagnosis not present

## 2023-10-22 DIAGNOSIS — I5022 Chronic systolic (congestive) heart failure: Secondary | ICD-10-CM

## 2023-10-22 DIAGNOSIS — R6520 Severe sepsis without septic shock: Secondary | ICD-10-CM | POA: Diagnosis not present

## 2023-10-22 LAB — RENAL FUNCTION PANEL
Albumin: 2.6 g/dL — ABNORMAL LOW (ref 3.5–5.0)
Albumin: 2.8 g/dL — ABNORMAL LOW (ref 3.5–5.0)
Anion gap: 10 (ref 5–15)
Anion gap: 12 (ref 5–15)
BUN: 11 mg/dL (ref 8–23)
BUN: 15 mg/dL (ref 8–23)
CO2: 22 mmol/L (ref 22–32)
CO2: 21 mmol/L — ABNORMAL LOW (ref 22–32)
Calcium: 9 mg/dL (ref 8.9–10.3)
Calcium: 9.4 mg/dL (ref 8.9–10.3)
Chloride: 107 mmol/L (ref 98–111)
Chloride: 104 mmol/L (ref 98–111)
Creatinine, Ser: 1.13 mg/dL — ABNORMAL HIGH (ref 0.44–1.00)
Creatinine, Ser: 1.31 mg/dL — ABNORMAL HIGH (ref 0.44–1.00)
GFR, Estimated: 51 mL/min — ABNORMAL LOW (ref >60)
GFR, Estimated: 43 mL/min — ABNORMAL LOW (ref >60)
Glucose, Bld: 102 mg/dL — ABNORMAL HIGH (ref 70–99)
Glucose, Bld: 92 mg/dL (ref 70–99)
Phosphorus: 4.2 mg/dL (ref 2.5–4.6)
Phosphorus: 3.9 mg/dL (ref 2.5–4.6)
Potassium: 3.6 mmol/L (ref 3.5–5.1)
Potassium: 3.2 mmol/L — ABNORMAL LOW (ref 3.5–5.1)
Sodium: 139 mmol/L (ref 135–145)
Sodium: 137 mmol/L (ref 135–145)

## 2023-10-22 LAB — GLUCOSE, CAPILLARY
Glucose-Capillary: 102 mg/dL — ABNORMAL HIGH (ref 70–99)
Glucose-Capillary: 102 mg/dL — ABNORMAL HIGH (ref 70–99)
Glucose-Capillary: 110 mg/dL — ABNORMAL HIGH (ref 70–99)
Glucose-Capillary: 113 mg/dL — ABNORMAL HIGH (ref 70–99)
Glucose-Capillary: 95 mg/dL (ref 70–99)
Glucose-Capillary: 97 mg/dL (ref 70–99)

## 2023-10-22 LAB — HEPARIN LEVEL (UNFRACTIONATED)
Heparin Unfractionated: 0.13 [IU]/mL — ABNORMAL LOW (ref 0.30–0.70)
Heparin Unfractionated: 0.34 [IU]/mL (ref 0.30–0.70)
Heparin Unfractionated: 0.35 [IU]/mL (ref 0.30–0.70)

## 2023-10-22 LAB — PROTIME-INR
INR: 1.8 — ABNORMAL HIGH (ref 0.8–1.2)
Prothrombin Time: 21 s — ABNORMAL HIGH (ref 11.4–15.2)

## 2023-10-22 LAB — MAGNESIUM: Magnesium: 2.3 mg/dL (ref 1.7–2.4)

## 2023-10-22 MED ORDER — WARFARIN - PHARMACIST DOSING INPATIENT: Freq: Every day | Status: DC

## 2023-10-22 MED ORDER — METOPROLOL SUCCINATE ER 25 MG PO TB24
25.0000 mg | ORAL_TABLET | Freq: Two times a day (BID) | ORAL | Status: DC
  Filled 2023-10-22: qty 1

## 2023-10-22 MED ORDER — METOPROLOL TARTRATE 25 MG PO TABS
25.0000 mg | ORAL_TABLET | Freq: Two times a day (BID) | ORAL | Status: DC
  Administered 2023-10-22 – 2023-10-26 (×8): 25 mg via ORAL
  Filled 2023-10-22 (×8): qty 1

## 2023-10-22 MED ORDER — POTASSIUM CHLORIDE 20 MEQ PO PACK
40.0000 meq | PACK | Freq: Once | ORAL | Status: AC
  Administered 2023-10-22: 40 meq via ORAL
  Filled 2023-10-22: qty 2

## 2023-10-22 MED ORDER — POTASSIUM CHLORIDE CRYS ER 20 MEQ PO TBCR: 40.0000 meq | EXTENDED_RELEASE_TABLET | Freq: Once | ORAL | Status: DC

## 2023-10-22 MED ORDER — SALINE SPRAY 0.65 % NA SOLN
1.0000 | NASAL | Status: DC | PRN
Start: 2023-10-22 — End: 2023-10-26
  Administered 2023-10-22 – 2023-10-23 (×3): 1 via NASAL
  Filled 2023-10-22: qty 44

## 2023-10-22 MED ORDER — ALUM & MAG HYDROXIDE-SIMETH 200-200-20 MG/5ML PO SUSP
15.0000 mL | Freq: Four times a day (QID) | ORAL | Status: DC | PRN
  Administered 2023-10-22 – 2023-10-24 (×2): 15 mL via ORAL
  Filled 2023-10-22 (×2): qty 30

## 2023-10-22 MED ORDER — FAMOTIDINE 20 MG PO TABS
20.0000 mg | ORAL_TABLET | Freq: Every day | ORAL | Status: DC
  Administered 2023-10-22 – 2023-10-26 (×5): 20 mg via ORAL
  Filled 2023-10-22 (×5): qty 1

## 2023-10-22 MED ORDER — HYDROCODONE-ACETAMINOPHEN 5-325 MG PO TABS
1.0000 | ORAL_TABLET | Freq: Four times a day (QID) | ORAL | Status: DC | PRN
  Administered 2023-10-22 – 2023-10-26 (×12): 1 via ORAL
  Filled 2023-10-22 (×12): qty 1

## 2023-10-22 MED ORDER — PIPERACILLIN-TAZOBACTAM 3.375 G IVPB
3.3750 g | Freq: Three times a day (TID) | INTRAVENOUS | Status: AC
Start: 2023-10-22 — End: 2023-10-24
  Administered 2023-10-22 – 2023-10-23 (×5): 3.375 g via INTRAVENOUS
  Filled 2023-10-22 (×5): qty 50

## 2023-10-22 MED ORDER — FUROSEMIDE 10 MG/ML IJ SOLN
120.0000 mg | Freq: Once | INTRAVENOUS | Status: AC
  Administered 2023-10-22: 120 mg via INTRAVENOUS
  Filled 2023-10-22: qty 10

## 2023-10-22 MED ORDER — WARFARIN SODIUM 2 MG PO TABS
2.0000 mg | ORAL_TABLET | Freq: Once | ORAL | Status: AC
  Administered 2023-10-22: 2 mg via ORAL
  Filled 2023-10-22: qty 1

## 2023-10-22 NOTE — Progress Notes (Signed)
 eLink Physician-Brief Progress Note Patient Name: Kim Anthony DOB: [birth year] [name]: [id number]   Date of Service  10/22/2023  HPI/Events of Note  Patient has chronic back/neck pain on Norco at home every 4 hours.  Currently 8 out of 10.  eICU Interventions  Resume home Norco every 6 hours as needed  Consider discontinuation of GI prophylaxis   0143 -patient is complaining of sharp pain at the CVC insertion site at the right neck.  No obvious hematoma, erythema, or drainage on visual exam.  Received analgesics roughly 1 hour prior.  Will initiate warm compresses for now.  If there is any warmth, erythema, or drainage from the site, will need to assess for removal.  Intervention Category Intermediate Interventions: Pain - evaluation and management  Reyaan Thoma 10/22/2023, 11:27 PM

## 2023-10-22 NOTE — Progress Notes (Signed)
 PHARMACY - ANTICOAGULATION CONSULT NOTE  Pharmacy Consult for Heparin  and Warfarin  Indication: atrial fibrillation with mechanical mitral valve   Allergies  Allergen Reactions   Lisinopril  Swelling   Acetaminophen -Codeine Itching   Propoxyphene Itching    Darvocet   Tape Rash    Plastic   Tramadol  Itching, Nausea And Vomiting and Rash    Patient Measurements: Height: 5\' 5"  (165.1 cm) Weight: 75.5 kg (166 lb 7.2 oz) IBW/kg (Calculated) : 57 HEPARIN  DW (KG): 72.6  Vital Signs: Temp: 97.7 F (36.5 C) (05/02 1615) Temp Source: Bladder (05/02 1600) BP: 100/66 (05/02 1700) Pulse Rate: 61 (05/02 1815)  Labs: Recent Labs    10/20/23 0229 10/20/23 1513 10/21/23 0213 10/21/23 1603 10/22/23 0048 10/22/23 0544 10/22/23 0958 10/22/23 1801  HGB 10.1*  --  9.4*  --   --   --   --   --   HCT 33.7*  --  31.5*  --   --   --   --   --   PLT 448*  --  335  --   --   --   --   --   LABPROT 44.8*  --  28.3*  --   --  21.0*  --   --   INR 4.7*  --  2.6*  --   --  1.8*  --   --   HEPARINUNFRC  --   --   --   --  0.13*  --  0.34 0.35  CREATININE 1.75*   < > 0.87 0.85  --  1.13*  --   --    < > = values in this interval not displayed.    Estimated Creatinine Clearance: 45.1 mL/min (A) (by C-G formula based on SCr of 1.13 mg/dL (H)).   Medical History: Past Medical History:  Diagnosis Date   Acute delirium 08/30/2023   Acute GI bleeding 08/18/2023   Arthritis    Asthma    Breast cancer (HCC) 2001   Left Breast Cancer   CHF (congestive heart failure) (HCC)    Closed fracture of right hip (HCC) 09/23/2022   Closed intertrochanteric fracture of right femur, initial encounter (HCC) 09/22/2022   COPD (chronic obstructive pulmonary disease) (HCC)    Coronary artery disease    GERD (gastroesophageal reflux disease)    History of mitral valve prosthesis 07/31/2023   History of mitral valve replacement with mechanical valve    HLD (hyperlipidemia)    Hypertension    Personal  history of breast cancer    Left Breast Cancer     Pulmonary embolism (HCC) 2021    Medications:  Scheduled:   (feeding supplement) PROSource Plus  30 mL Oral TID BM   arformoterol   15 mcg Nebulization BID   budesonide  (PULMICORT ) nebulizer solution  0.5 mg Nebulization BID   Chlorhexidine  Gluconate Cloth  6 each Topical Q0600   famotidine   20 mg Oral Daily   feeding supplement  237 mL Oral BID BM   gabapentin   200 mg Oral TID   insulin  aspart  1-3 Units Subcutaneous Q4H   metoprolol  tartrate  25 mg Oral BID   multivitamin  1 tablet Oral QHS   mouth rinse  15 mL Mouth Rinse 4 times per day   QUEtiapine   25 mg Oral QHS   revefenacin   175 mcg Nebulization Daily   Warfarin - Pharmacist Dosing Inpatient   Does not apply q1600   Infusions:   heparin  1,300 Units/hr (10/22/23 1800)  piperacillin -tazobactam (ZOSYN )  IV Stopped (10/22/23 1718)    Assessment: Kim Anthony is a 73 y.o. year old female presented on 10/18/2023 with concern for AMS. On warfarin prior to admission for MVR and atrial fibrillation. Prior to admission regimen, 5 mg PO daily (total weekly dose 35 mg). Last dose prior to admission 10/17/23 PM. Pharmacy consulted for heparin  / warfarin management.   Resuming warfarin 5/2.  INR trend 4/29: 3.3, 4/30: 4.7, 5/1: 2.6, 5/2: 1.8 Warfarin dosing 4/29: 0 mg, 4/30: 0 mg, 5/1: 0 mg, 5/2: 2 mg   INR subtherapeutic today secondary to being held since admission. Poor dietary intake and antimicrobials will increase sensitivity to warfarin.   5/2 PM: Heparin  level therapeutic x2 at 0.35 with heparin  running at 1300 units/hour , no issues noted  Goal of Therapy:  Heparin  level 0.3-0.7 units/ml INR 2.5-3.5    Monitor platelets by anticoagulation protocol: Yes   Plan:  Continue heparin  infusion at 1300 units/hr Daily HL, Daily PT/INR   Continue to monitor H&H and platelets   Chrystie Crass, PharmD Clinical Pharmacist  10/22/2023 6:50 PM

## 2023-10-22 NOTE — Progress Notes (Signed)
 NAME:  Kim Anthony, [name]:  [id number], DOB:  1950-08-31, LOS: 3 ADMISSION DATE:  10/18/2023, CONSULTATION DATE: 10/19/2023 REFERRING MD: Internal medicine, CHIEF COMPLAINT: Worsening renal failure hypotension  History of Present Illness:  73 year old female with a history of breast cancer, COPD with continued tobacco, O2 dependent at 2 L, right heart failure, and atrial fibrillation on warfarin.  who presents with worsening renal failure potassium 6.1 white count of 17 hypotension and bradycardia.  She had calcium  gluconate for her elevated potassium sodium bicarbonate .  Her baseline creatinine is 1.85 currently is 4.07 potassium is 6.1.  She currently has been fluid resuscitated for hypotension and bradycardia is not on vasopressor support at this time.  She does have elevated white count therefore she is on empirical antimicrobial therapy she does open her eyes and follow commands at this time and protect her airway therefore does not need intubation at this time.  She has been scheduled for interventional radiology to place hemodialysis catheter we will continue gentle fluid resuscitation.  Pertinent  Medical History   Past Medical History:  Diagnosis Date   Acute delirium 08/30/2023   Acute GI bleeding 08/18/2023   Arthritis    Asthma    Breast cancer (HCC) 2001   Left Breast Cancer   CHF (congestive heart failure) (HCC)    Closed fracture of right hip (HCC) 09/23/2022   Closed intertrochanteric fracture of right femur, initial encounter (HCC) 09/22/2022   COPD (chronic obstructive pulmonary disease) (HCC)    Coronary artery disease    GERD (gastroesophageal reflux disease)    History of mitral valve prosthesis 07/31/2023   History of mitral valve replacement with mechanical valve    HLD (hyperlipidemia)    Hypertension    Personal history of breast cancer    Left Breast Cancer     Pulmonary embolism (HCC) 2021     Significant Hospital Events: Including procedures, antibiotic  start and stop dates in addition to other pertinent events   4/29 Admitted with renal failure, hypotension, HyperK. Admitted to ICU, CRRT. Pressors.  4/30 Precedex  started overnight. On 5 mcg NE. CRRT ongoing 5/1 - off pressors, but delirium is a bit worse today. Precedex  increasing.   Interim History / Subjective:  Delirious overnight but ok on precedex .  Off crrt and making urine. Cultures negative  Objective   Blood pressure (!) 152/83, pulse (!) 52, temperature 97.9 F (36.6 C), resp. rate (!) 23, height 5\' 5"  (1.651 m), weight 75.5 kg, SpO2 99%.        Intake/Output Summary (Last 24 hours) at 10/22/2023 0820 Last data filed at 10/22/2023 0600 Gross per 24 hour  Intake 1121.83 ml  Output 793.7 ml  Net 328.13 ml   Filed Weights   10/20/23 0500 10/21/23 0500 10/22/23 0500  Weight: 75.5 kg 75.8 kg 75.5 kg    Examination:  Elderly woman, resting comfortably  On 2LNC Breathing non labored, no wheeze Eyes closed, but follows commands and responds to questions Weak but moves all 4 extremities Mild dysarthria Labs:  Cultures ngtd blood K 3.6 Cr 1.13 Na 139 INR 1.8 CBGs ok  Resolved Hospital Problem list     Assessment & Plan:   Acute on CKD 3b - required CRRT, now making urine, doing ok off crrt.  - Holding losartan , torsemide , farxiga  - Trend renal indices, I/O  Severe sepsis Leukocytosis: CXR and UA unrevealing. Parotid glands appear swollen but were recently worked up and were benign. Improving. MRSA swab negative. - Cultures all  negative - Continue Zosyn  for empiric 5 days  Acute encephalopathy secondary to  Renal failure Hospital delirium - off precedex  - needs PT, OOB - - Maintain good sleep/wake cycles - Resume home gabapentin  in case of withdrawal  Chronic HFpEF - blood pressure improved - resume GDMT today with metoprolol , continue to hold losartan  - PRN hydralazine   Mechanical valve on warfarin goal INR 2.5-3.5 Atrial fibrillation - will resume  warfarin today and continue heparin  gtt - Goal 2.5-3.5  Diabetes mellitus - Sliding scale insulin   COPD without exacerbation Chronic hypoxic respiratory failure with chronic 2L O2 need - O2 as needed - Triple neb therapy in place of home Trelegy   Best Practice (right click and "Reselect all SmartList Selections" daily)   Diet/type: dysphagia diet (see orders). DVT prophylaxis systemic heparin  - mech valve Pressure ulcer(s): N/A GI prophylaxis: PPI Lines: Dialysis Catheter and Arterial Line will d/c arterial line. Maintain HD catheter another day or two to make sure she stays orr RRT Foley:  Yes, and it is still needed Code Status:  full code Last date of multidisciplinary goals of care discussion [daughter [name] updated at bedside 5/2]  May be able to transfer out of ICU today pending her delirium and ability to stay off precedex .   Louie Rover, MD Pulmonary and Critical Care Medicine [facility] 10/22/2023 8:23 AM Pager: see AMION  If no response to pager, please call critical care on call (see AMION) until 7pm After 7:00 pm call Elink

## 2023-10-22 NOTE — TOC CM/SW Note (Addendum)
 Transition of Care [facility]) - Inpatient Brief Assessment   Patient Details  Name: Kim Anthony [name]: [id number] Date of Birth: 1950-10-25  Transition [facility]) CM/SW Contact:    Tom-Johnson, Leatha Rohner Daphne, RN Phone Number: 10/22/2023, 1:34 PM   Clinical Narrative:  Patient presented to the ED with Shortness of Breath, found to have elevated Creatinine >4. Nephrology consulted, patient started on CRRT and stopped yesterday, d/t improved Creatinine at 1.3 today. Admitted with AMS 2/2 Sepsis, on IV abx. On Coumadin  for MVR. Patient has hx of A-Fib, COPD, Lt Breast Cancer s/p Mastectomy,  CKD 3b, Heart Failure and Pulmonary HTN.  From home with daughter, modified independent, has a cane, walker, shower seat and BSC at home. On 2L home O2 as needed. Currently active with Home health PT/OT/RN through [facility]. Resumption of care order needed at discharge. Daughter transports to appointments.  Patient not Medically ready for discharge.  CM will continue to follow as patient progresses with care towards discharge.        Transition of Care Asessment: Insurance and Status: Insurance coverage has been reviewed Patient has primary care physician: Yes Home environment has been reviewed: Yes Prior level of function:: Modified Independent Prior/Current Home Services: Current home services ([facility]) Social Drivers of Health Review: SDOH reviewed no interventions necessary Readmission risk has been reviewed: Yes Transition of care needs: transition of care needs identified, TOC will continue to follow

## 2023-10-22 NOTE — Progress Notes (Addendum)
 Nutrition Follow-up  DOCUMENTATION CODES:   Not applicable  INTERVENTION:  Initiate 48 hour calorie count 5/3-5/4 Continue current diet per SLP recommendation; dysphagia 3 Feeding assistance with meals, snacks and supplements Ensure Enlive po BID, each supplement provides 350 kcal and 20 grams of protein. Magic cup TID with meals, each supplement provides 290 kcal and 9 grams of protein Renal MVI with minerals daily Consider Cortrak placement on Monday if intake remains limited  If TF indicated, recommend:  Osmolite 1.5 at 65ml/hr ( per day) 60ml ProSource TF20 once daily Provides 1700 kcal, 88g protein and free water daily  NUTRITION DIAGNOSIS:  Increased nutrient needs related to acute illness, catabolic illness as evidenced by estimated needs. - remains applicable  GOAL:  Patient will meet greater than or equal to 90% of their needs - goal unmet  MONITOR:  PO intake, Supplement acceptance, Labs, Weight trends, Diet advancement, I & O's  REASON FOR ASSESSMENT:  Consult Enteral/tube feeding initiation and management  ASSESSMENT:  Pt admitted with worsening renal failure and hypotension. PMH significant for breast cancer, COPD, tobacco use, right heart failure, afib on warfarin. Recently admitted 4/9-4/18 for CHF exacerbation.  4/29: admitted, CRRT initiated 4/30: bedside swallow w/SLP- dysphagia 3, thin liquids (edentulous) 5/2: CRRT discontinued  Pt has been intermittently delirious and refusing medications.  Per Nephrology, monitoring UOP and necessity for resumption of CRRT.  PMT consulted and working with daughter and pt to establish GOC.  Family meeting scheduled for Saturday.   Consult received for initiation of TF. Pt on Cortrak list.  Called and spoke with pt's daughter via phone call.  She prefers to hold on Cortrak placement for today and re-evaluate on Monday. She states that she feels that pt's PO intake is starting to pick back up but needs  lots of encouragement. She mentions that she feels her intake will improve further once moved out of ICU and with more family coming to visit.   Pt's daughter reports that she has been cooking for her mom. When she is feeling well her appetite is great but says that she had not been feeling well PTA, she was eating less. Recently she switched to a low sodium diet which has been a difficult adjustment for patient.   Admit weight: 68 kg (question whether this is stated vs measured) Current weight: 75.5 kg  Drains/lines: A-line RIJ triple lumen HD cath UOP: x24 hours     Medications: SSI 1-3 units q4h, rena-vit, IV abx   Labs:  Cr 1.13 GFR 51 CBG's 95-136 x24 hours  Diet Order:   Diet Order             DIET DYS 3 Room service appropriate? Yes; Fluid consistency: Thin  Diet effective now                   EDUCATION NEEDS:   Not appropriate for education at this time  Skin:  Skin Assessment: Reviewed RN Assessment  Last BM:  5/1 type 5 small  Height:   Ht Readings from Last 1 Encounters:  10/19/23 5\' 5"  (1.651 m)    Weight:   Wt Readings from Last 1 Encounters:  10/22/23 75.5 kg    Ideal Body Weight:  56.8 kg  BMI:  Body mass index is 27.7 kg/m.  Estimated Nutritional Needs:   Kcal:  1600-1800  Protein:  85-100g  Fluid:  1L + UOP  Rocklin Chute, RDN, LDN Clinical Nutrition See AMiON for contact information.

## 2023-10-22 NOTE — Progress Notes (Signed)
 Internal Medicine Clinic Attending  Case discussed with the resident at the time of the visit.  We reviewed the resident's history and exam and pertinent patient test results.  I agree with the assessment, diagnosis, and plan of care documented in the resident's note.

## 2023-10-22 NOTE — Progress Notes (Signed)
 Pharmacy Electrolyte Replacement  Recent Labs:  Recent Labs    10/22/23 0544  K 3.6  MG 2.3  PHOS 4.2  CREATININE 1.13*    Low Critical Values (K </= 2.5, Phos </= 1, Mg </= 1) Present: None  MD Contacted: n/a  Plan: 40 mEq KCL PO x1   [name], PharmD, BCPS, BCCCP Clinical Pharmacist

## 2023-10-22 NOTE — Progress Notes (Signed)
 Kim Anthony is an 73 y.o. female COPD, MVR on coumadin , atrial fibrillation, chronic constipation on chronic opioids, s/p lt mastectomy w breast cancer, CKD3b (BL 1.4-1.9)recently admitted with heart failure, pulmonary HTN with severely reduced RV function diuresed. H/O poor compliance with diet and diuretics, on home torsemide  80mg  daily and at time of discharge her Cr was 1.92. Now p/w SOB, somnolence, AMS, neck pain.   Assessment/Plan:  AKI on CKD3b (BL 1.4-1.9) discharged recently with a cr of 1.92 in the setting previously with severe right sided heart failure. Hyperkalemia treated medically and at time of consultation she had received Lokelma  10gm x1. CXR showed cardiomegaly but no vascular congestion.- Hold losartan , torsemide , farxiga . CRRT (4/29-5/1).  Daughter [name] was updated Approximately 140 mL in the Foley bag; strict I's and O's.. Will challenge with Lasix  120mg  x1  Hopefully UOP picks up now that CRRT is off; will monitor closely and if needed restart CRRT over the weekend    AMS possibly from sepsis with leukocytosis but certainly uremia could be contributing as well. Being treated with broad spectrum abx. Combined CHF with elevated BNP and h/o noncompliance and discharged on torsemide  80mg  daily from recent hospitalization.  Atrial fibrillation  heparin  gtt, subtherapeutic INR prior discharge recently but initial INR 3.3. Now 2.6 HTN COPD  Subjective: Cooperative but at times angry, asking when she's going home. Denies f/c/n/v.    Chemistry and CBC: Creatinine, Ser  Date/Time Value Ref Range Status  10/22/2023 05:44 AM 1.13 (H) 0.44 - 1.00 mg/dL Final  40/98/1191 47:82 PM 0.85 0.44 - 1.00 mg/dL Final  95/62/1308 65:78 AM 0.87 0.44 - 1.00 mg/dL Final  46/96/2952 84:13 PM 1.10 (H) 0.44 - 1.00 mg/dL Final  24/40/1027 25:36 AM 1.75 (H) 0.44 - 1.00 mg/dL Final    Comment:    DELTA CHECK NOTED  10/19/2023 03:30 PM 3.88 (H) 0.44 - 1.00 mg/dL Final  64/40/3474 25:95 AM  4.07 (H) 0.44 - 1.00 mg/dL Final  63/87/5643 32:95 AM 4.10 (H) 0.44 - 1.00 mg/dL Final  18/84/1660 63:01 AM 4.04 (H) 0.44 - 1.00 mg/dL Final  60/03/9322 55:73 PM 1.85 (H) 0.57 - 1.00 mg/dL Final  22/07/5425 06:23 AM 1.92 (H) 0.44 - 1.00 mg/dL Final  76/28/3151 76:16 PM 2.13 (H) 0.44 - 1.00 mg/dL Final  07/37/1062 69:48 AM 2.33 (H) 0.44 - 1.00 mg/dL Final  54/62/7035 00:93 AM 1.73 (H) 0.44 - 1.00 mg/dL Final  81/82/9937 16:96 AM 1.47 (H) 0.44 - 1.00 mg/dL Final  78/93/8101 75:10 AM 1.43 (H) 0.44 - 1.00 mg/dL Final  25/85/2778 24:23 AM 1.39 (H) 0.44 - 1.00 mg/dL Final  53/61/4431 54:00 AM 1.97 (H) 0.44 - 1.00 mg/dL Final  86/76/1950 93:26 AM 2.04 (H) 0.44 - 1.00 mg/dL Final  71/24/5809 98:33 AM 1.91 (H) 0.44 - 1.00 mg/dL Final  82/50/5397 67:34 AM 1.94 (H) 0.44 - 1.00 mg/dL Final  19/37/9024 09:73 AM 2.64 (H) 0.44 - 1.00 mg/dL Final  53/29/9242 68:34 AM 2.70 (H) 0.44 - 1.00 mg/dL Final  19/62/2297 98:92 PM 2.67 (H) 0.44 - 1.00 mg/dL Final  11/94/1740 81:44 AM 2.23 (H) 0.44 - 1.00 mg/dL Final  81/85/6314 97:02 AM 1.46 (H) 0.44 - 1.00 mg/dL Final  63/78/5885 02:77 AM 1.85 (H) 0.44 - 1.00 mg/dL Final  41/28/7867 67:20 AM 1.66 (H) 0.44 - 1.00 mg/dL Final  94/70/9628 36:62 AM 1.92 (H) 0.44 - 1.00 mg/dL Final  94/76/5465 03:54 AM 1.76 (H) 0.44 - 1.00 mg/dL Final  65/68/1275 17:00 AM 1.92 (H) 0.44 - 1.00 mg/dL  Final  09/01/2023 07:57 AM 1.88 (H) 0.44 - 1.00 mg/dL Final  78/29/5621 30:86 AM 1.41 (H) 0.44 - 1.00 mg/dL Final  57/84/6962 95:28 AM 1.46 (H) 0.44 - 1.00 mg/dL Final  41/32/4401 02:72 PM 1.45 (H) 0.44 - 1.00 mg/dL Final  53/66/4403 47:42 AM 1.32 (H) 0.44 - 1.00 mg/dL Final  59/56/3875 64:33 PM 1.39 (H) 0.44 - 1.00 mg/dL Final  29/51/8841 66:06 AM 1.23 (H) 0.44 - 1.00 mg/dL Final  30/16/0109 32:35 PM 1.40 (H) 0.44 - 1.00 mg/dL Final  57/32/2025 42:70 AM 1.32 (H) 0.44 - 1.00 mg/dL Final  62/37/6283 15:17 AM 1.30 (H) 0.44 - 1.00 mg/dL Final  61/60/7371 06:26 AM 1.05 (H) 0.44 - 1.00  mg/dL Final  94/85/4627 03:50 PM 0.87 0.44 - 1.00 mg/dL Final  09/38/1829 93:71 AM 0.85 0.44 - 1.00 mg/dL Final  69/67/8938 10:17 PM 0.97 0.44 - 1.00 mg/dL Final  51/07/5850 77:82 PM 0.93 0.44 - 1.00 mg/dL Final  42/35/3614 43:15 AM 1.02 (H) 0.44 - 1.00 mg/dL Final  40/01/6760 95:09 PM 1.18 (H) 0.44 - 1.00 mg/dL Final  32/67/1245 80:99 AM 1.47 (H) 0.44 - 1.00 mg/dL Final  83/38/2505 39:76 AM 2.17 (H) 0.44 - 1.00 mg/dL Final  73/41/9379 02:40 PM 2.81 (H) 0.44 - 1.00 mg/dL Final  97/35/3299 24:26 AM 3.12 (H) 0.44 - 1.00 mg/dL Final   Recent Labs  Lab 10/19/23 0523 10/19/23 0600 10/19/23 0605 10/19/23 1530 10/20/23 0229 10/20/23 1513 10/21/23 0213 10/21/23 1603 10/22/23 0544  NA 128*   < > 129* 131* 133* 134* 135 137 139  K 6.1*   < > 6.5* 5.2* 4.2 3.9 3.9 3.5 3.6  CL 100  --   --  100 102 103 104 106 107  CO2 17*  --   --  19* 22 23 23 22 22   GLUCOSE 90  --   --  87 107* 110* 122* 109* 102*  BUN 102*  --   --  102* 45* 23 15 10 11   CREATININE 4.07*  --   --  3.88* 1.75* 1.10* 0.87 0.85 1.13*  CALCIUM  8.9  --   --  8.8* 9.5 8.7* 8.6* 8.7* 9.0  PHOS 7.9*  --   --  7.9* 3.3 2.1* 2.0* 3.6 4.2   < > = values in this interval not displayed.   Recent Labs  Lab 10/18/23 2342 10/19/23 0122 10/19/23 0530 10/19/23 0600 10/19/23 0605 10/20/23 0229 10/21/23 0213  WBC 19.7*  --  17.5*  --   --  15.1* 11.6*  HGB 10.3*   < > 10.7* 11.2* 11.2* 10.1* 9.4*  HCT 38.3   < > 36.7 33.0* 33.0* 33.7* 31.5*  MCV 99.2  --  92.4  --   --  90.1 88.7  PLT 417*  --  443*  --   --  448* 335   < > = values in this interval not displayed.   Liver Function Tests: Recent Labs  Lab 10/19/23 0523 10/19/23 1530 10/21/23 0213 10/21/23 1603 10/22/23 0544  AST 18  --  19  --   --   ALT 20  --  17  --   --   ALKPHOS 108  --  143*  --   --   BILITOT 0.8  --  1.4*  --   --   PROT 6.9  --  6.1*  --   --   ALBUMIN 3.5  3.5   < > 2.7* 2.6* 2.6*   < > = values in  this interval not displayed.   No  results for input(s): "LIPASE", "AMYLASE" in the last 168 hours. No results for input(s): "AMMONIA" in the last 168 hours. Cardiac Enzymes: Recent Labs  Lab 10/19/23 0632  CKTOTAL 78   Iron  Studies: No results for input(s): "IRON ", "TIBC", "TRANSFERRIN", "FERRITIN" in the last 72 hours. PT/INR: @LABRCNTIP (inr:5)  Xrays/Other Studies: ) Results for orders placed or performed during the hospital encounter of 10/18/23 (from the past 48 hours)  Glucose, capillary     Status: Abnormal   Collection Time: 10/20/23 11:15 AM  Result Value Ref Range   Glucose-Capillary 109 (H) 70 - 99 mg/dL    Comment: Glucose reference range applies only to samples taken after fasting for at least 8 hours.  Glucose, capillary     Status: Abnormal   Collection Time: 10/20/23  3:10 PM  Result Value Ref Range   Glucose-Capillary 113 (H) 70 - 99 mg/dL    Comment: Glucose reference range applies only to samples taken after fasting for at least 8 hours.  Renal function panel (daily at 1600)     Status: Abnormal   Collection Time: 10/20/23  3:13 PM  Result Value Ref Range   Sodium 134 (L) 135 - 145 mmol/L   Potassium 3.9 3.5 - 5.1 mmol/L   Chloride 103 98 - 111 mmol/L   CO2 23 22 - 32 mmol/L   Glucose, Bld 110 (H) 70 - 99 mg/dL    Comment: Glucose reference range applies only to samples taken after fasting for at least 8 hours.   BUN 23 8 - 23 mg/dL   Creatinine, Ser 8.65 (H) 0.44 - 1.00 mg/dL   Calcium  8.7 (L) 8.9 - 10.3 mg/dL   Phosphorus  2.1 (L) 2.5 - 4.6 mg/dL   Albumin 2.6 (L) 3.5 - 5.0 g/dL   GFR, Estimated 53 (L) >60 mL/min    Comment: (NOTE) Calculated using the CKD-EPI Creatinine Equation (2021)    Anion gap 8 5 - 15    Comment: Performed at [facility], [address], [place], [place] [address]  Glucose, capillary     Status: Abnormal   Collection Time: 10/20/23  7:30 PM  Result Value Ref Range   Glucose-Capillary 127 (H) 70 - 99 mg/dL    Comment: Glucose reference range applies  only to samples taken after fasting for at least 8 hours.  Glucose, capillary     Status: None   Collection Time: 10/20/23 11:18 PM  Result Value Ref Range   Glucose-Capillary 95 70 - 99 mg/dL    Comment: Glucose reference range applies only to samples taken after fasting for at least 8 hours.  Protime-INR     Status: Abnormal   Collection Time: 10/21/23  2:13 AM  Result Value Ref Range   Prothrombin  Time 28.3 (H) 11.4 - 15.2 seconds   INR 2.6 (H) 0.8 - 1.2    Comment: (NOTE) INR goal varies based on device and disease states. Performed at [facility], [address] [place], [place] [address]   Magnesium      Status: None   Collection Time: 10/21/23  2:13 AM  Result Value Ref Range   Magnesium  2.2 1.7 - 2.4 mg/dL    Comment: Performed at [facility], [address], [place], [place] [address]  CBC     Status: Abnormal   Collection Time: 10/21/23  2:13 AM  Result Value Ref Range   WBC 11.6 (H) 4.0 - 10.5 K/uL  RBC 3.55 (L) 3.87 - 5.11 MIL/uL   Hemoglobin 9.4 (L) 12.0 - 15.0 g/dL   HCT 16.1 (L) 09.6 - 04.5 %   MCV 88.7 80.0 - 100.0 fL   MCH 26.5 26.0 - 34.0 pg   MCHC 29.8 (L) 30.0 - 36.0 g/dL   RDW 40.9 (H) 81.1 - 91.4 %   Platelets 335 150 - 400 K/uL   nRBC 0.3 (H) 0.0 - 0.2 %    Comment: Performed at [facility], [address] [place], [place] [address]  Comprehensive metabolic panel with GFR     Status: Abnormal   Collection Time: 10/21/23  2:13 AM  Result Value Ref Range   Sodium 135 135 - 145 mmol/L   Potassium 3.9 3.5 - 5.1 mmol/L   Chloride 104 98 - 111 mmol/L   CO2 23 22 - 32 mmol/L   Glucose, Bld 122 (H) 70 - 99 mg/dL    Comment: Glucose reference range applies only to samples taken after fasting for at least 8 hours.   BUN 15 8 - 23 mg/dL   Creatinine, Ser 6.21 0.44 - 1.00 mg/dL   Calcium  8.6 (L) 8.9 - 10.3 mg/dL   Total Protein 6.1 (L) 6.5 - 8.1 g/dL   Albumin 2.7 (L) 3.5 - 5.0 g/dL   AST 19 15 - 41 U/L   ALT 17 0 - 44 U/L    Alkaline Phosphatase 143 (H) 38 - 126 U/L   Total Bilirubin 1.4 (H) 0.0 - 1.2 mg/dL   GFR, Estimated >30 >86 mL/min    Comment: (NOTE) Calculated using the CKD-EPI Creatinine Equation (2021)    Anion gap 8 5 - 15    Comment: Performed at [facility], [address], [place], [place] [address]  Phosphorus      Status: Abnormal   Collection Time: 10/21/23  2:13 AM  Result Value Ref Range   Phosphorus  2.0 (L) 2.5 - 4.6 mg/dL    Comment: Performed at [facility], [address] [place], [place] [address]  Glucose, capillary     Status: Abnormal   Collection Time: 10/21/23  5:11 AM  Result Value Ref Range   Glucose-Capillary 119 (H) 70 - 99 mg/dL    Comment: Glucose reference range applies only to samples taken after fasting for at least 8 hours.  Glucose, capillary     Status: Abnormal   Collection Time: 10/21/23  7:18 AM  Result Value Ref Range   Glucose-Capillary 61 (L) 70 - 99 mg/dL    Comment: Glucose reference range applies only to samples taken after fasting for at least 8 hours.  Glucose, capillary     Status: Abnormal   Collection Time: 10/21/23  7:48 AM  Result Value Ref Range   Glucose-Capillary 140 (H) 70 - 99 mg/dL    Comment: Glucose reference range applies only to samples taken after fasting for at least 8 hours.  Glucose, capillary     Status: Abnormal   Collection Time: 10/21/23 11:36 AM  Result Value Ref Range   Glucose-Capillary 119 (H) 70 - 99 mg/dL    Comment: Glucose reference range applies only to samples taken after fasting for at least 8 hours.  Glucose, capillary     Status: Abnormal   Collection Time: 10/21/23  3:06 PM  Result Value Ref Range   Glucose-Capillary 117 (H) 70 - 99 mg/dL    Comment: Glucose reference range applies only to samples taken after fasting for at least  8 hours.  Renal function panel (daily at 1600)     Status: Abnormal   Collection Time: 10/21/23  4:03 PM  Result Value Ref Range   Sodium 137 135 - 145 mmol/L    Potassium 3.5 3.5 - 5.1 mmol/L   Chloride 106 98 - 111 mmol/L   CO2 22 22 - 32 mmol/L   Glucose, Bld 109 (H) 70 - 99 mg/dL    Comment: Glucose reference range applies only to samples taken after fasting for at least 8 hours.   BUN 10 8 - 23 mg/dL   Creatinine, Ser 0.45 0.44 - 1.00 mg/dL   Calcium  8.7 (L) 8.9 - 10.3 mg/dL   Phosphorus  3.6 2.5 - 4.6 mg/dL   Albumin 2.6 (L) 3.5 - 5.0 g/dL   GFR, Estimated >40 >98 mL/min    Comment: (NOTE) Calculated using the CKD-EPI Creatinine Equation (2021)    Anion gap 9 5 - 15    Comment: Performed at [facility], [address], [place], [place] [address]  Glucose, capillary     Status: Abnormal   Collection Time: 10/21/23  7:40 PM  Result Value Ref Range   Glucose-Capillary 136 (H) 70 - 99 mg/dL    Comment: Glucose reference range applies only to samples taken after fasting for at least 8 hours.  Glucose, capillary     Status: Abnormal   Collection Time: 10/22/23 12:08 AM  Result Value Ref Range   Glucose-Capillary 110 (H) 70 - 99 mg/dL    Comment: Glucose reference range applies only to samples taken after fasting for at least 8 hours.  Heparin  level (unfractionated)     Status: Abnormal   Collection Time: 10/22/23 12:48 AM  Result Value Ref Range   Heparin  Unfractionated 0.13 (L) 0.30 - 0.70 IU/mL    Comment: (NOTE) The clinical reportable range upper limit is being lowered to >1.10 to align with the FDA approved guidance for the current laboratory assay.  If heparin  results are below expected values, and patient dosage has  been confirmed, suggest follow up testing of antithrombin III levels. Performed at [facility], [address], [place], [place] [address]   Glucose, capillary     Status: None   Collection Time: 10/22/23  3:27 AM  Result Value Ref Range   Glucose-Capillary 95 70 - 99 mg/dL    Comment: Glucose reference range applies only to samples taken after fasting for at least 8 hours.  Protime-INR     Status:  Abnormal   Collection Time: 10/22/23  5:44 AM  Result Value Ref Range   Prothrombin  Time 21.0 (H) 11.4 - 15.2 seconds   INR 1.8 (H) 0.8 - 1.2    Comment: (NOTE) INR goal varies based on device and disease states. Performed at [facility], [address], [place], [place] [address]   Renal function panel (daily at 0500)     Status: Abnormal   Collection Time: 10/22/23  5:44 AM  Result Value Ref Range   Sodium 139 135 - 145 mmol/L   Potassium 3.6 3.5 - 5.1 mmol/L   Chloride 107 98 - 111 mmol/L   CO2 22 22 - 32 mmol/L   Glucose, Bld 102 (H) 70 - 99 mg/dL    Comment: Glucose reference range applies only to samples taken after fasting for at least 8 hours.   BUN 11 8 - 23 mg/dL   Creatinine, Ser 8.65 (H) 0.44 - 1.00 mg/dL   Calcium  9.0  8.9 - 10.3 mg/dL   Phosphorus  4.2 2.5 - 4.6 mg/dL   Albumin 2.6 (L) 3.5 - 5.0 g/dL   GFR, Estimated 51 (L) >60 mL/min    Comment: (NOTE) Calculated using the CKD-EPI Creatinine Equation (2021)    Anion gap 10 5 - 15    Comment: Performed at [facility], [address] [place], [place] [address]  Magnesium      Status: None   Collection Time: 10/22/23  5:44 AM  Result Value Ref Range   Magnesium  2.3 1.7 - 2.4 mg/dL    Comment: Performed at [facility], [address], [place], [place] [address]  Glucose, capillary     Status: Abnormal   Collection Time: 10/22/23  7:42 AM  Result Value Ref Range   Glucose-Capillary 102 (H) 70 - 99 mg/dL    Comment: Glucose reference range applies only to samples taken after fasting for at least 8 hours.   *Note: Due to a large number of results and/or encounters for the requested time period, some results have not been displayed. A complete set of results can be found in Results Review.   No results found.   PMH:   Past Medical History:  Diagnosis Date   Acute delirium 08/30/2023   Acute GI bleeding 08/18/2023   Arthritis    Asthma    Breast cancer (HCC) 2001   Left Breast Cancer    CHF (congestive heart failure) (HCC)    Closed fracture of right hip (HCC) 09/23/2022   Closed intertrochanteric fracture of right femur, initial encounter (HCC) 09/22/2022   COPD (chronic obstructive pulmonary disease) (HCC)    Coronary artery disease    GERD (gastroesophageal reflux disease)    History of mitral valve prosthesis 07/31/2023   History of mitral valve replacement with mechanical valve    HLD (hyperlipidemia)    Hypertension    Personal history of breast cancer    Left Breast Cancer     Pulmonary embolism (HCC) 2021    PSH:   Past Surgical History:  Procedure Laterality Date   APPLICATION OF A-CELL OF CHEST/ABDOMEN Left 01/27/2019   Procedure: APPLICATION OF A-CELL OF CHEST;  Surgeon: Thornell Flirt, DO;  Location: [facility] OR;  Service: Plastics;  Laterality: Left;   APPLICATION OF WOUND VAC N/A 01/27/2019   Procedure: APPLICATION OF WOUND VAC;  Surgeon: Thornell Flirt, DO;  Location: [facility] OR;  Service: Plastics;  Laterality: N/A;   BREAST SURGERY Left 2001   for CA   CARDIOVERSION N/A 02/26/2022   Procedure: CARDIOVERSION;  Surgeon: Wendie Hamburg, MD;  Location: [facility] ENDOSCOPY;  Service: Cardiovascular;  Laterality: N/A;   COLONOSCOPY WITH PROPOFOL  N/A 06/07/2018   Procedure: COLONOSCOPY WITH PROPOFOL ;  Surgeon: Baldo Bonds, MD;  Location: [facility] ENDOSCOPY;  Service: Endoscopy;  Laterality: N/A;   DEBRIDEMENT AND CLOSURE WOUND Left 12/28/2018   Procedure: Debridement And Closure Wound;  Surgeon: Oza Blumenthal, MD;  Location: [facility] OR;  Service: General;  Laterality: Left;   ESOPHAGOGASTRODUODENOSCOPY (EGD) WITH PROPOFOL  N/A 08/24/2023   Procedure: ESOPHAGOGASTRODUODENOSCOPY (EGD) WITH PROPOFOL ;  Surgeon: Renaye Carp, DO;  Location: [facility] ENDOSCOPY;  Service: Gastroenterology;  Laterality: N/A;   INCISION AND DRAINAGE OF WOUND Left 01/27/2019   Procedure: IRRIGATION AND DEBRIDEMENT OF CHEST WALL;  Surgeon: Thornell Flirt, DO;  Location: [facility] OR;   Service: Plastics;  Laterality: Left;   INTRAMEDULLARY (IM) NAIL INTERTROCHANTERIC N/A 09/23/2022   Procedure: INTRAMEDULLARY (IM) NAIL INTERTROCHANTERIC;  Surgeon: Wes Hamman,  MD;  Location: [facility] OR;  Service: Orthopedics;  Laterality: N/A;   IR FLUORO GUIDE CV LINE RIGHT  10/01/2023   IR US  GUIDE VASC ACCESS RIGHT  10/01/2023   IR VENO/EXT/UNI RIGHT  10/05/2023   KNEE ARTHROSCOPY WITH MEDIAL MENISECTOMY Left 08/06/2021   Procedure: KNEE ARTHROSCOPY WITH PARTIAL MEDIAL MENISECTOMY, DEBRIDEMENT;  Surgeon: Orvan Blanch, MD;  Location: [facility] ORS;  Service: Orthopedics;  Laterality: Left;  1 HR   LATISSIMUS FLAP TO BREAST Left 01/18/2019   Procedure: LEFT LATISSIMUS FLAP TO BREAST;  Surgeon: Thornell Flirt, DO;  Location: [facility] OR;  Service: Plastics;  Laterality: Left;   MASTECTOMY Left    MITRAL VALVE REPLACEMENT     mechanical   POLYPECTOMY  06/07/2018   Procedure: POLYPECTOMY;  Surgeon: Baldo Bonds, MD;  Location: [facility] ENDOSCOPY;  Service: Endoscopy;;   RIGHT HEART CATH N/A 08/20/2023   Procedure: RIGHT HEART CATH;  Surgeon: Mardell Shade, MD;  Location: [facility] INVASIVE CV LAB;  Service: Cardiovascular;  Laterality: N/A;   RIGHT/LEFT HEART CATH AND CORONARY ANGIOGRAPHY N/A 01/09/2021   Procedure: RIGHT/LEFT HEART CATH AND CORONARY ANGIOGRAPHY;  Surgeon: Mardell Shade, MD;  Location: [facility] CV LAB;  Service: Cardiovascular;  Laterality: N/A;   WOUND DEBRIDEMENT Left 12/28/2018   Procedure: EXCISION OF LEFT CHRONIC CHEST WALL WOUND;  Surgeon: Oza Blumenthal, MD;  Location: [facility] OR;  Service: General;  Laterality: Left;    Allergies:  Allergies  Allergen Reactions   Lisinopril  Swelling   Acetaminophen -Codeine Itching   Propoxyphene Itching    Darvocet   Tape Rash    Plastic   Tramadol  Itching, Nausea And Vomiting and Rash    Medications:   Prior to Admission medications   Medication Sig Start Date End Date Taking? Authorizing Provider  albuterol  (VENTOLIN  HFA) 108  (90 Base) MCG/ACT inhaler Inhale 1 puff into the lungs every 4 (four) hours as needed for wheezing or shortness of breath. 09/14/23  Yes Juberg, Christopher, DO  colchicine  0.6 MG tablet Take 0.5 tablets (0.3 mg total) by mouth daily. 10/08/23  Yes Cleven Dallas, DO  FARXIGA  10 MG TABS tablet Take 1 tablet by mouth once daily 08/31/23  Yes Amoako, Prince, MD  fluticasone  (FLONASE ) 50 MCG/ACT nasal spray Place 2 sprays into both nostrils daily. Patient taking differently: Place 1 spray into both nostrils daily. 05/17/23 05/16/24 Yes Amoako, Prince, MD  Fluticasone -Umeclidin-Vilant (TRELEGY ELLIPTA ) 100-62.5-25 MCG/ACT AEPB Inhale 1 puff into the lungs daily. 09/14/23  Yes Carleen Chary, DO  gabapentin  (NEURONTIN ) 100 MG capsule Take 2 capsules (200 mg total) by mouth 3 (three) times daily. 10/08/23  Yes Cleven Dallas, DO  HYDROcodone -acetaminophen  (NORCO/VICODIN) 5-325 MG tablet Take 1 tablet by mouth every 4 (four) hours as needed for moderate pain (pain score 4-6). Diagnosis- chronic low back pain 09/17/23  Yes Lovorn, Megan, MD  hydrOXYzine  (ATARAX ) 10 MG tablet TAKE 1 TABLET BY MOUTH THREE TIMES DAILY AS NEEDED FOR ANXIETY 10/18/23  Yes Amoako, Prince, MD  losartan  (COZAAR ) 25 MG tablet Take 0.5 tablets (12.5 mg total) by mouth daily. 08/10/23 11/08/23 Yes Milford, Arlice Bene, FNP  metoprolol  succinate (TOPROL -XL) 25 MG 24 hr tablet Take 1 tablet (25 mg total) by mouth 2 (two) times daily. 08/01/23  Yes Jayson Michael, MD  potassium chloride  (KLOR-CON  M) 10 MEQ tablet Take 2 tablets (20 mEq total) by mouth daily. 09/07/23 04/04/24 Yes Amoako, Prince, MD  senna-docusate (SENOKOT-S) 8.6-50 MG tablet Take 1 tablet by mouth at bedtime as needed for mild constipation.  10/08/23  Yes Cleven Dallas, DO  simvastatin  (ZOCOR ) 20 MG tablet Take 1 tablet (20 mg total) by mouth daily. 12/03/22  Yes Allana Arabia, MD  torsemide  (DEMADEX ) 20 MG tablet Take 4 tablets (80 mg total) by mouth daily. 10/08/23  Yes Cleven Dallas, DO  Vitamin D , Ergocalciferol , (DRISDOL ) 1.25 MG (50000 UNIT) CAPS capsule Take 1 capsule (50,000 Units total) by mouth every 7 (seven) days. 10/11/23  Yes Cleven Dallas, DO  warfarin (COUMADIN ) 5 MG tablet Take 1 tablet (5 mg total) by mouth daily at 4 PM. 10/08/23  Yes Cleven Dallas, DO  allopurinol  (ZYLOPRIM ) 100 MG tablet Take 0.5 tablets (50 mg total) by mouth every other day. Patient not taking: Reported on 10/19/2023 10/13/23   Malen Scudder, DO    Discontinued Meds:   Medications Discontinued During This Encounter  Medication Reason   warfarin (COUMADIN ) tablet 5 mg    warfarin (COUMADIN ) tablet 5 mg    Warfarin - Pharmacist Dosing Inpatient    piperacillin -tazobactam (ZOSYN ) IVPB 2.25 g    vancomycin  variable dose per unstable renal function (pharmacist dosing)    0.9 %  sodium chloride  infusion    vancomycin  (VANCOCIN ) IVPB 1000 mg/200 mL premix    budeson-glycopyrrolate -formoterol  (BREZTRI ) 160-9-4.8 MCG/ACT inhaler 2 puff    ipratropium-albuterol  (DUONEB) 0.5-2.5 (3) MG/3ML nebulizer solution 3 mL    sodium zirconium cyclosilicate  (LOKELMA ) packet 10 g    PrismaSol  BGK 2/3.5 infusion    PrismaSol  BGK 2/3.5 infusion    PrismaSol  BGK 2/3.5 infusion    famotidine  (PEPCID ) IVPB 20 mg premix    piperacillin -tazobactam (ZOSYN ) IVPB 3.375 g    norepinephrine  (LEVOPHED ) 4mg  in (0.016 mg/mL) premix infusion    famotidine  (PEPCID ) tablet 10 mg    prismasol  BGK 4/2.5 infusion    prismasol  BGK 4/2.5 infusion    prismasol  BGK 4/2.5 infusion    potassium chloride  SA (KLOR-CON  M) CR tablet 40 mEq    metoprolol  succinate (TOPROL -XL) 24 hr tablet 25 mg    dexmedetomidine  (PRECEDEX ) 400 MCG/100ML (4 mcg/mL) infusion Patient Transfer    Social History:  reports that she has been smoking cigarettes. She has a 3 pack-year smoking history. She has never used smokeless tobacco. She reports current alcohol use of about 3.0 standard drinks of alcohol per week. She reports that she  does not use drugs.  Family History:   Family History  Problem Relation Age of Onset   Hypertension Mother    Cancer Father    Hypertension Father    Breast cancer Maternal Aunt 50   Heart attack Other     Blood pressure 132/63, pulse 62, temperature 98.1 F (36.7 C), resp. rate 19, height 5\' 5"  (1.651 m), weight 75.5 kg, SpO2 100%. Physical Exam: General appearance: NAD, answering questions Head: NCAT Back: No CVA tenderness. Cardio: bradycardic GI: SNDNT+BS Extremities: edema none Pulses: 2+ and symmetric Skin: Skin color, texture, turgor normal. No rashes or lesions Access: RIJ temp    Patrick Boor, MD 10/22/2023, 11:00 AM

## 2023-10-22 NOTE — Progress Notes (Signed)
 PHARMACY - ANTICOAGULATION CONSULT NOTE  Pharmacy Consult for Heparin  and Warfarin  Indication: atrial fibrillation with mechanical mitral valve   Allergies  Allergen Reactions   Lisinopril  Swelling   Acetaminophen -Codeine Itching   Propoxyphene Itching    Darvocet   Tape Rash    Plastic   Tramadol  Itching, Nausea And Vomiting and Rash    Patient Measurements: Height: 5\' 5"  (165.1 cm) Weight: 75.5 kg (166 lb 7.2 oz) IBW/kg (Calculated) : 57 HEPARIN  DW (KG): 72.6  Vital Signs: Temp: 98.1 F (36.7 C) (05/02 0815) Temp Source: Oral (05/02 0800) BP: 139/84 (05/02 0800) Pulse Rate: 53 (05/02 0815)  Labs: Recent Labs    10/20/23 0229 10/20/23 1513 10/21/23 0213 10/21/23 1603 10/22/23 0048 10/22/23 0544  HGB 10.1*  --  9.4*  --   --   --   HCT 33.7*  --  31.5*  --   --   --   PLT 448*  --  335  --   --   --   LABPROT 44.8*  --  28.3*  --   --  21.0*  INR 4.7*  --  2.6*  --   --  1.8*  HEPARINUNFRC  --   --   --   --  0.13*  --   CREATININE 1.75*   < > 0.87 0.85  --  1.13*   < > = values in this interval not displayed.    Estimated Creatinine Clearance: 45.1 mL/min (A) (by C-G formula based on SCr of 1.13 mg/dL (H)).   Medical History: Past Medical History:  Diagnosis Date   Acute delirium 08/30/2023   Acute GI bleeding 08/18/2023   Arthritis    Asthma    Breast cancer (HCC) 2001   Left Breast Cancer   CHF (congestive heart failure) (HCC)    Closed fracture of right hip (HCC) 09/23/2022   Closed intertrochanteric fracture of right femur, initial encounter (HCC) 09/22/2022   COPD (chronic obstructive pulmonary disease) (HCC)    Coronary artery disease    GERD (gastroesophageal reflux disease)    History of mitral valve prosthesis 07/31/2023   History of mitral valve replacement with mechanical valve    HLD (hyperlipidemia)    Hypertension    Personal history of breast cancer    Left Breast Cancer     Pulmonary embolism (HCC) 2021    Medications:   Scheduled:   (feeding supplement) PROSource Plus  30 mL Oral TID BM   arformoterol   15 mcg Nebulization BID   budesonide  (PULMICORT ) nebulizer solution  0.5 mg Nebulization BID   Chlorhexidine  Gluconate Cloth  6 each Topical Q0600   famotidine   20 mg Oral Daily   feeding supplement  237 mL Oral BID BM   gabapentin   200 mg Oral TID   insulin  aspart  1-3 Units Subcutaneous Q4H   metoprolol  tartrate  25 mg Oral BID   multivitamin  1 tablet Oral QHS   mouth rinse  15 mL Mouth Rinse 4 times per day   potassium chloride   40 mEq Oral Once   QUEtiapine   25 mg Oral QHS   revefenacin   175 mcg Nebulization Daily   Infusions:   dexmedetomidine  (PRECEDEX ) IV infusion Stopped (10/22/23 0756)   heparin  1,300 Units/hr (10/22/23 0800)   piperacillin -tazobactam (ZOSYN )  IV      Assessment: Kim Anthony is a 73 y.o. year old female presented on 10/18/2023 with concern for AMS. On warfarin prior to admission for MVR and atrial fibrillation.  Prior to admission regimen, 5 mg PO daily (total weekly dose 35 mg). Last dose prior to admission 10/17/23 PM. Pharmacy consulted for heparin  / warfarin management.   Resuming warfarin 5/2.  INR trend 4/29: 3.3, 4/30: 4.7, 5/1: 2.6, 5/2: 1.8 Warfarin dosing 4/29: 0 mg, 4/30: 0 mg, 5/1: 0 mg, 5/2: 2 mg   INR subtherapeutic today secondary to being held since admission. Poor dietary intake and antimicrobials will increase sensitivity to warfarin.   Heparin  level therapeutic at 0.34, no issues reported by RN.   Goal of Therapy:  Heparin  level 0.3-0.7 units/ml INR 2.5-3.5    Monitor platelets by anticoagulation protocol: Yes   Plan:  Warfarin 2 mg x1, dose to be adjusted day by day for now  Continue heparin  infusion at 1300 units/hr Check confirmatory anti-Xa level in 8 hours and daily while on heparin  Continue to monitor H&H and platelets Daily PT/INR   Kim Anthony 10/22/2023,9:39 AM

## 2023-10-22 NOTE — Progress Notes (Signed)
 PHARMACY - ANTICOAGULATION CONSULT NOTE  Pharmacy Consult for heparin  Indication:  Afib/MVR  Labs: Recent Labs    10/19/23 0325 10/19/23 0523 10/19/23 0530 10/19/23 0600 10/19/23 0605 10/19/23 1610 10/19/23 1530 10/20/23 0229 10/20/23 1513 10/21/23 0213 10/21/23 1603 10/22/23 0048  HGB  --   --  10.7*   < > 11.2*  --   --  10.1*  --  9.4*  --   --   HCT  --   --  36.7   < > 33.0*  --   --  33.7*  --  31.5*  --   --   PLT  --   --  443*  --   --   --   --  448*  --  335  --   --   LABPROT  --   --   --   --   --   --   --  44.8*  --  28.3*  --   --   INR  --   --   --   --   --   --   --  4.7*  --  2.6*  --   --   HEPARINUNFRC  --   --   --   --   --   --   --   --   --   --   --  0.13*  CREATININE  --    < >  --   --   --   --    < > 1.75* 1.10* 0.87 0.85  --   CKTOTAL  --   --   --   --   --  78  --   --   --   --   --   --   TROPONINIHS 17  --   --   --   --   --   --   --   --   --   --   --    < > = values in this interval not displayed.   Assessment: 72yo female subtherapeutic on heparin  with initial dosing while warfarin on hold; no infusion issues or signs of bleeding per RN.  Goal of Therapy:  Heparin  level 0.3-0.7 units/ml   Plan:  Increase heparin  infusion by 4 units/kg/hr to 1300 units/hr. Check level in 8 hours.   Lonnie Roberts, PharmD, BCPS 10/22/2023 1:33 AM

## 2023-10-23 DIAGNOSIS — Z515 Encounter for palliative care: Secondary | ICD-10-CM | POA: Diagnosis not present

## 2023-10-23 DIAGNOSIS — R4182 Altered mental status, unspecified: Secondary | ICD-10-CM

## 2023-10-23 DIAGNOSIS — N179 Acute kidney failure, unspecified: Secondary | ICD-10-CM | POA: Diagnosis not present

## 2023-10-23 DIAGNOSIS — A419 Sepsis, unspecified organism: Secondary | ICD-10-CM | POA: Diagnosis not present

## 2023-10-23 DIAGNOSIS — Z7189 Other specified counseling: Secondary | ICD-10-CM | POA: Diagnosis not present

## 2023-10-23 DIAGNOSIS — N1832 Chronic kidney disease, stage 3b: Secondary | ICD-10-CM | POA: Diagnosis not present

## 2023-10-23 DIAGNOSIS — D72829 Elevated white blood cell count, unspecified: Secondary | ICD-10-CM | POA: Diagnosis not present

## 2023-10-23 LAB — RENAL FUNCTION PANEL
Albumin: 2.7 g/dL — ABNORMAL LOW (ref 3.5–5.0)
Albumin: 3.1 g/dL — ABNORMAL LOW (ref 3.5–5.0)
Anion gap: 10 (ref 5–15)
Anion gap: 11 (ref 5–15)
BUN: 16 mg/dL (ref 8–23)
BUN: 18 mg/dL (ref 8–23)
CO2: 25 mmol/L (ref 22–32)
CO2: 23 mmol/L (ref 22–32)
Calcium: 9.1 mg/dL (ref 8.9–10.3)
Calcium: 9 mg/dL (ref 8.9–10.3)
Chloride: 105 mmol/L (ref 98–111)
Chloride: 102 mmol/L (ref 98–111)
Creatinine, Ser: 1.44 mg/dL — ABNORMAL HIGH (ref 0.44–1.00)
Creatinine, Ser: 1.7 mg/dL — ABNORMAL HIGH (ref 0.44–1.00)
GFR, Estimated: 38 mL/min — ABNORMAL LOW (ref >60)
GFR, Estimated: 31 mL/min — ABNORMAL LOW (ref >60)
Glucose, Bld: 103 mg/dL — ABNORMAL HIGH (ref 70–99)
Glucose, Bld: 87 mg/dL (ref 70–99)
Phosphorus: 4.3 mg/dL (ref 2.5–4.6)
Phosphorus: 4.2 mg/dL (ref 2.5–4.6)
Potassium: 3 mmol/L — ABNORMAL LOW (ref 3.5–5.1)
Potassium: 3.6 mmol/L (ref 3.5–5.1)
Sodium: 140 mmol/L (ref 135–145)
Sodium: 136 mmol/L (ref 135–145)

## 2023-10-23 LAB — GLUCOSE, CAPILLARY
Glucose-Capillary: 102 mg/dL — ABNORMAL HIGH (ref 70–99)
Glucose-Capillary: 108 mg/dL — ABNORMAL HIGH (ref 70–99)
Glucose-Capillary: 115 mg/dL — ABNORMAL HIGH (ref 70–99)
Glucose-Capillary: 80 mg/dL (ref 70–99)
Glucose-Capillary: 98 mg/dL (ref 70–99)

## 2023-10-23 LAB — PROTIME-INR
INR: 1.9 — ABNORMAL HIGH (ref 0.8–1.2)
Prothrombin Time: 21.8 s — ABNORMAL HIGH (ref 11.4–15.2)

## 2023-10-23 LAB — CBC
HCT: 29.9 % — ABNORMAL LOW (ref 36.0–46.0)
Hemoglobin: 9.1 g/dL — ABNORMAL LOW (ref 12.0–15.0)
MCH: 27.1 pg (ref 26.0–34.0)
MCHC: 30.4 g/dL (ref 30.0–36.0)
MCV: 89 fL (ref 80.0–100.0)
Platelets: 327 10*3/uL (ref 150–400)
RBC: 3.36 MIL/uL — ABNORMAL LOW (ref 3.87–5.11)
RDW: 21.3 % — ABNORMAL HIGH (ref 11.5–15.5)
WBC: 11.2 10*3/uL — ABNORMAL HIGH (ref 4.0–10.5)
nRBC: 0 % (ref 0.0–0.2)

## 2023-10-23 LAB — MAGNESIUM: Magnesium: 2 mg/dL (ref 1.7–2.4)

## 2023-10-23 LAB — HEPARIN LEVEL (UNFRACTIONATED): Heparin Unfractionated: 0.36 [IU]/mL (ref 0.30–0.70)

## 2023-10-23 MED ORDER — POTASSIUM CHLORIDE CRYS ER 20 MEQ PO TBCR
40.0000 meq | EXTENDED_RELEASE_TABLET | Freq: Once | ORAL | Status: AC
  Administered 2023-10-23: 40 meq via ORAL
  Filled 2023-10-23: qty 2

## 2023-10-23 MED ORDER — WARFARIN SODIUM 4 MG PO TABS
4.0000 mg | ORAL_TABLET | Freq: Once | ORAL | Status: AC
  Administered 2023-10-23: 4 mg via ORAL
  Filled 2023-10-23: qty 1

## 2023-10-23 MED ORDER — POTASSIUM CHLORIDE CRYS ER 20 MEQ PO TBCR
20.0000 meq | EXTENDED_RELEASE_TABLET | Freq: Once | ORAL | Status: AC
  Administered 2023-10-23: 20 meq via ORAL
  Filled 2023-10-23: qty 1

## 2023-10-23 MED ORDER — MELATONIN 5 MG PO TABS
5.0000 mg | ORAL_TABLET | Freq: Every evening | ORAL | Status: DC | PRN
  Administered 2023-10-24: 5 mg via ORAL
  Filled 2023-10-23: qty 1

## 2023-10-23 NOTE — Plan of Care (Signed)

## 2023-10-23 NOTE — Progress Notes (Signed)
 PHARMACY - ANTICOAGULATION CONSULT NOTE  Pharmacy Consult for Heparin  and Warfarin  Indication: atrial fibrillation with mechanical mitral valve   Allergies  Allergen Reactions   Lisinopril  Swelling   Acetaminophen -Codeine Itching   Propoxyphene Itching    Darvocet   Tape Rash    Plastic   Tramadol  Itching, Nausea And Vomiting and Rash    Patient Measurements: Height: 5\' 5"  (165.1 cm) Weight: 75 kg (165 lb 5.5 oz) IBW/kg (Calculated) : 57 HEPARIN  DW (KG): 72.6  Vital Signs: Temp: 97.5 F (36.4 C) (05/03 0800) Temp Source: Bladder (05/03 0800) BP: 112/45 (05/03 0800) Pulse Rate: 71 (05/03 0800)  Labs: Recent Labs    10/21/23 0213 10/21/23 1603 10/22/23 0544 10/22/23 0958 10/22/23 1801 10/23/23 0256  HGB 9.4*  --   --   --   --  9.1*  HCT 31.5*  --   --   --   --  29.9*  PLT 335  --   --   --   --  327  LABPROT 28.3*  --  21.0*  --   --  21.8*  INR 2.6*  --  1.8*  --   --  1.9*  HEPARINUNFRC  --    < >  --  0.34 0.35 0.36  CREATININE 0.87   < > 1.13*  --  1.31* 1.44*   < > = values in this interval not displayed.    Estimated Creatinine Clearance: 35.3 mL/min (A) (by C-G formula based on SCr of 1.44 mg/dL (H)).   Assessment: Kim Anthony is a 73 y.o. year old female presented on 10/18/2023 with concern for AMS. On warfarin prior to admission for MVR and atrial fibrillation. Prior to admission regimen, 5 mg PO daily (total weekly dose 35 mg). Last dose prior to admission 10/17/23 PM. Pharmacy consulted for heparin  / warfarin management.   Heparin  level therapeutic and stable.  INR subtherapeutic at 1.9.  Poor dietary intake and antimicrobials will increase sensitivity to warfarin.  No bleeding reported.  Goal of Therapy:  Heparin  level 0.3-0.7 units/ml INR 2.5-3.5    Monitor platelets by anticoagulation protocol: Yes   Plan:  Continue heparin  infusion at 1300 units/hr Coumadin  4mg  PO today Daily heparin  level, PT/ INR and CBC  Mayela Bullard D. Marikay Show, PharmD, BCPS,  BCCCP 10/23/2023, 8:18 AM

## 2023-10-23 NOTE — Evaluation (Signed)
 Physical Therapy Evaluation Patient Details Name: Kim Anthony [name]: [id number] DOB: Apr 29, 1951 Today's Date: 10/23/2023  History of Present Illness  73 y/o F presenting to ED on 4/28 with SOB, neck pain, found to be in septic shock with renal failure, started on CRRT, 4/29-5/2. Recent admit 4/9-4/18 with heart failure exacerbation. PMH includes CHF, COPD, CAD, HLD, HTN, DVT/PE on warfarin, breast CA.    Clinical Impression  Pt in bed upon arrival of PT, agreeable to evaluation at this time. Prior to admission the pt was largely sedentary but able to complete short distance ambulation in the home as needed with use of furniture walking. The pt now presents with limitations in functional mobility, strength, power, stability, activity tolerance, and endurance due to above dx, and will continue to benefit from skilled PT to address these deficits. She required up to minA to steady with transfers, but was able to complete with improved stability and CGA with use of cane. She was limited to ~75 ft ambulation on RA with reports of minimal SOB (unable to get reliable reading on SpO2 during ambulation). Upon return to room, SpO2 noted to be 87% on RA with decent pleth, pt returned to 2L O2. The pt will continue to benefit from skilled PT to further progress functional strength, stability, and activity tolerance to allow for safest return home.         If plan is discharge home, recommend the following: Assistance with cooking/housework;Assist for transportation;Help with stairs or ramp for entrance;A little help with walking and/or transfers   Can travel by private vehicle   Yes    Equipment Recommendations None recommended by PT  Recommendations for Other Services       Functional Status Assessment Patient has had a recent decline in their functional status and demonstrates the ability to make significant improvements in function in a reasonable and predictable amount of time.     Precautions /  Restrictions Precautions Precautions: Fall Recall of Precautions/Restrictions: Intact Precaution/Restrictions Comments: watch O2 Restrictions Weight Bearing Restrictions Per Provider Order: No      Mobility  Bed Mobility Overal bed mobility: Needs Assistance Bed Mobility: Supine to Sit     Supine to sit: Supervision          Transfers Overall transfer level: Needs assistance Equipment used: Straight cane Transfers: Sit to/from Stand Sit to Stand: Contact guard assist, Min assist           General transfer comment: minA initially to steady, then CGA for line management    Ambulation/Gait Ambulation/Gait assistance: Min assist Gait Distance (Feet): 75 Feet Assistive device: Straight cane Gait Pattern/deviations: Step-through pattern, Decreased stride length Gait velocity: decreased Gait velocity interpretation: <1.31 ft/sec, indicative of household ambulator   General Gait Details: Pt ambulated with a slow and steady gait with even weight shift and good foot clearence. pt self-limiting due to fatigue. unable to get reading on SpO2 during gait, pt reports minimal SOB. SpO2 at 87% with good pleth after gait so pt returned to 2L O2      Balance Overall balance assessment: Mild deficits observed, not formally tested Sitting-balance support: Feet supported, No upper extremity supported Sitting balance-Leahy Scale: Good     Standing balance support: During functional activity Standing balance-Leahy Scale: Fair                               Pertinent Vitals/Pain Pain Assessment Pain Assessment: No/denies pain Pain  Score: 0-No pain    Home Living Family/patient expects to be discharged to:: Private residence Living Arrangements: Children Available Help at Discharge: Family;Available PRN/intermittently Type of Home: House Home Access: Stairs to enter Entrance Stairs-Rails: Right;Left;Can reach both Entrance Stairs-Number of Steps: 8   Home  Layout: One level Home Equipment: Rollator (4 wheels);Rolling Walker (2 wheels);Cane - single point;BSC/3in1;Shower seat;Grab bars - tub/shower Additional Comments: does not wear O2 at home    Prior Function Prior Level of Function : Needs assist             Mobility Comments: ind; furniture walks, reports largely sedentary at home, not on O2 ADLs Comments: daughter assists with  meds     Extremity/Trunk Assessment   Upper Extremity Assessment Upper Extremity Assessment: Defer to OT evaluation    Lower Extremity Assessment Lower Extremity Assessment: Generalized weakness (grossy 4/5 to MMT, limited endurance)    Cervical / Trunk Assessment Cervical / Trunk Assessment: Kyphotic  Communication   Communication Communication: No apparent difficulties    Cognition Arousal: Alert Behavior During Therapy: WFL for tasks assessed/performed   PT - Cognitive impairments: No apparent impairments                       PT - Cognition Comments: not formally assessed, pt Following commands: Intact       Cueing Cueing Techniques: Verbal cues     General Comments General comments (skin integrity, edema, etc.): SpO2 down to ?mid80s with poor pleth on 2L O2, incr to 90s at rest        Assessment/Plan    PT Assessment Patient needs continued PT services  PT Problem List Decreased strength;Decreased range of motion;Decreased activity tolerance;Decreased balance;Decreased mobility;Decreased coordination;Decreased cognition;Decreased knowledge of use of DME;Decreased safety awareness;Decreased knowledge of precautions;Cardiopulmonary status limiting activity       PT Treatment Interventions DME instruction;Gait training;Stair training;Functional mobility training;Therapeutic activities;Therapeutic exercise;Balance training;Neuromuscular re-education;Cognitive remediation;Patient/family education    PT Goals (Current goals can be found in the Care Plan section)  Acute  Rehab PT Goals Patient Stated Goal: Go Home PT Goal Formulation: With patient Time For Goal Achievement: 11/06/23 Potential to Achieve Goals: Good    Frequency Min 2X/week        AM-PAC PT "6 Clicks" Mobility  Outcome Measure Help needed turning from your back to your side while in a flat bed without using bedrails?: A Little Help needed moving from lying on your back to sitting on the side of a flat bed without using bedrails?: A Little Help needed moving to and from a bed to a chair (including a wheelchair)?: A Little Help needed standing up from a chair using your arms (e.g., wheelchair or bedside chair)?: A Little Help needed to walk in hospital room?: A Little Help needed climbing 3-5 steps with a railing? : A Little 6 Click Score: 18    End of Session Equipment Utilized During Treatment: Gait belt;Oxygen  Activity Tolerance: Patient tolerated treatment well Patient left: in bed;with call bell/phone within reach;with family/visitor present Nurse Communication: Mobility status (O2 needs) PT Visit Diagnosis: Unsteadiness on feet (R26.81);Other abnormalities of gait and mobility (R26.89);Muscle weakness (generalized) (M62.81)    Time: 4098-1191 PT Time Calculation (min) (ACUTE ONLY): 30 min   Charges:   PT Evaluation $PT Eval Moderate Complexity: 1 Mod   PT General Charges $$ ACUTE PT VISIT: 1 Visit         Barnabas Booth, PT, DPT   Acute Rehabilitation Department Office [phone]  Secure Chat Communication Preferred  Lona Rist 10/23/2023, 2:07 PM

## 2023-10-23 NOTE — Progress Notes (Signed)
 Internal Medicine Teaching Service to Assume Care on 10/24/2023 at 7AM  Kim Luton, MD [facility] Internal Medicine Resident PGY-2

## 2023-10-23 NOTE — Progress Notes (Signed)
 eLink Physician-Brief Progress Note Patient Name: Kim Anthony DOB: July 31, 1950 [name]: [id number]   Date of Service  10/23/2023  HPI/Events of Note  61F with COPD on supplemental O2, heart failure and atrial fibrillation who is admitted for acute on chronic CKDIIIb, encephalopathy and sepsis due to unknown source.   She came in encephalopathic but there has been improvement in her mental status.  She is requesting anxiolytics and something for insomnia.  eICU Interventions  Melatonin as needed.  Avoid additional deliriogenic meds at this time     Intervention Category Minor Interventions: Agitation / anxiety - evaluation and management  Kim Anthony 10/23/2023, 11:40 PM

## 2023-10-23 NOTE — Progress Notes (Signed)
 Palliative Medicine Progress Note   Patient Name: Kim Anthony       Date: 10/23/2023 DOB: [birth year]  Age: 73 y.o. [name]#: [id number] Attending Physician: Wilfredo Hanly, MD Primary Care Physician: [name], MD Admit Date: 10/18/2023  Reason for Consultation/Follow-up: {Reason for Consult:23484}  HPI/Patient Profile: 73 y.o. female  with past medical history of COPD, remote history breast ca s/p mastectomy/radiation with no further recurrence, chronic respiratory failure on 2L oxygen  at home, CAD, CHF (recent hospitalization for exacerbation), CKD, chronic back pain, admitted on 10/18/2023 with SOB, neck pain. Found to be in septic shock- source unclear with renal failure with Cr 4.07 and K 6.1. Has been started on CRRT. Admission complicated by encephalopathy due to uremia and sepsis. Palliative consulted for goals of care.   Subjective:   Objective:  Physical Exam          Vital Signs: BP 120/62   Pulse 65   Temp 98.1 F (36.7 C)   Resp 18   Ht 5\' 5"  (1.651 m)   Wt 75 kg   SpO2 98%   BMI 27.51 kg/m  SpO2: SpO2: 98 % O2 Device: O2 Device: Nasal Cannula O2 Flow Rate: O2 Flow Rate (L/min): 3 L/min  Intake/output summary:  Intake/Output Summary (Last 24 hours) at 10/23/2023 1705 Last data filed at 10/23/2023 1000 Gross per 24 hour  Intake 348.35 ml  Output 1250 ml  Net -901.65 ml    LBM: Last BM Date : 10/23/23     Palliative Assessment/Data: ***     Palliative Medicine Assessment & Plan   Assessment: Principal Problem:   Altered mental state Active Problems:   Essential hypertension   COPD (chronic obstructive pulmonary disease) (HCC)   Chronic anticoagulation   Combined systolic and diastolic congestive heart failure (HCC)   Hyperkalemia   Acute on chronic  hypoxic respiratory failure (HCC)   Acute kidney injury superimposed on stage 3a chronic kidney disease (HCC)   RVF (right ventricular failure) (HCC)   Uremia   Hyponatremia   AKI (acute kidney injury) (HCC)    Recommendations/Plan: ***  Goals of Care and Additional Recommendations: Limitations on Scope of Treatment: {Recommended Scope and Preferences:21019}  Code Status:   Prognosis:  {Palliative Care Prognosis:23504}  Discharge Planning: {Palliative dispostion:23505}  Care plan was discussed with ***  Thank  you for allowing the Palliative Medicine Team to assist in the care of this patient.   ***   Wynetta Heckle, NP   Please contact Palliative Medicine Team phone at [phone] for questions and concerns.  For individual providers, please see AMION.

## 2023-10-23 NOTE — Progress Notes (Signed)
 NAME:  Kim Anthony, [name]:  [id number], DOB:  1951-01-31, LOS: 4 ADMISSION DATE:  10/18/2023, CONSULTATION DATE: 10/19/2023 REFERRING MD: Internal medicine, CHIEF COMPLAINT: Worsening renal failure hypotension  History of Present Illness:  73 year old female with a history of breast cancer, COPD with continued tobacco, O2 dependent at 2 L, right heart failure, and atrial fibrillation on warfarin.  who presents with worsening renal failure potassium 6.1 white count of 17 hypotension and bradycardia.  She had calcium  gluconate for her elevated potassium sodium bicarbonate .  Her baseline creatinine is 1.85 currently is 4.07 potassium is 6.1.  She currently has been fluid resuscitated for hypotension and bradycardia is not on vasopressor support at this time.  She does have elevated white count therefore she is on empirical antimicrobial therapy she does open her eyes and follow commands at this time and protect her airway therefore does not need intubation at this time.  She has been scheduled for interventional radiology to place hemodialysis catheter we will continue gentle fluid resuscitation.  Pertinent  Medical History   Past Medical History:  Diagnosis Date   Acute delirium 08/30/2023   Acute GI bleeding 08/18/2023   Arthritis    Asthma    Breast cancer (HCC) 2001   Left Breast Cancer   CHF (congestive heart failure) (HCC)    Closed fracture of right hip (HCC) 09/23/2022   Closed intertrochanteric fracture of right femur, initial encounter (HCC) 09/22/2022   COPD (chronic obstructive pulmonary disease) (HCC)    Coronary artery disease    GERD (gastroesophageal reflux disease)    History of mitral valve prosthesis 07/31/2023   History of mitral valve replacement with mechanical valve    HLD (hyperlipidemia)    Hypertension    Personal history of breast cancer    Left Breast Cancer     Pulmonary embolism (HCC) 2021     Significant Hospital Events: Including procedures, antibiotic  start and stop dates in addition to other pertinent events   4/29 Admitted with renal failure, hypotension, HyperK. Admitted to ICU, CRRT. Pressors.  4/30 Precedex  started overnight. On 5 mcg NE. CRRT ongoing 5/1 - off pressors, but delirium is a bit worse today. Precedex  increasing.  5/3 awaiting transfer renal function up a little bit after getting Lasix  the day prior  Interim History / Subjective:  No distress.  Resting comfortably denies pain  Objective   Blood pressure (!) 89/59, pulse 65, temperature 97.9 F (36.6 C), resp. rate (!) 21, height 5\' 5"  (1.651 m), weight 75 kg, SpO2 97%.        Intake/Output Summary (Last 24 hours) at 10/23/2023 0806 Last data filed at 10/23/2023 0600 Gross per 24 hour  Intake 451.2 ml  Output 2800 ml  Net -2348.8 ml   Filed Weights   10/21/23 0500 10/22/23 0500 10/23/23 0500  Weight: 75.8 kg 75.5 kg 75 kg    Examination:  General This is a pleasant 73 year old female patient sitting up in bed she is in no acute distress currently HEENT normocephalic atraumatic no jugular venous distention Pulmonary clear to auscultation Cardiac: Holosystolic heart murmur audible currently regular irregular Abdomen soft nontender Extremities warm dry brisk cap refill Neuro awake oriented x 3 no focal deficits does report discomfort in the right neck she attributes to the triple-lumen catheter GU clear yellow urine.  Resolved Hospital Problem list     Assessment & Plan:   Acute on CKD 3b - required CRRT, now making urine, doing ok off crrt. Had fairly sig  UOP yesterday -2.3 liters. Did get lasix  IV x 1 and scr bumped plan Holding losartan , torsemide , farxiga  Will hold off from diuresis today  Trend renal indices, I/O Additional recs per nephro   Leukocytosis w/Severe sepsis (source not identified): CXR and UA unrevealing. Parotid glands appear swollen but were recently worked up and were benign. Improving. MRSA swab negative. - Cultures all  negative Plan Day 3 of 5 zosyn   Trend fever curve and wbc ct which is improving   Acute encephalopathy secondary to Renal failure and Hospital delirium. Also w/ h/o chronic back pain - off precedex  plan PT, OOB Maintain good sleep/wake cycles Resume homed gabapentin  in case of withdrawal Resumed NORCO from home  Chronic HFpEF plan resumed GDMT today with metoprolol , continue to hold losartan  PRN hydralazine   Mechanical valve on warfarin goal INR 2.5-3.5 Atrial fibrillation INR currently 1.9 Plan We resumed warfarin 5/2 and cont to bridge w/ heparin  gtt Goal 2.5-3.5  Diabetes mellitus Plan Sliding scale insulin   COPD without exacerbation Chronic hypoxic respiratory failure with chronic 2L O2 need Plan Cont O2 as needed Triple neb therapy in place of home Trelegy, can go back on this at [employer] (right click and "Reselect all SmartList Selections" daily)   Diet/type: dysphagia diet (see orders). DVT prophylaxis systemic heparin  - mech valve Pressure ulcer(s): N/A GI prophylaxis: PPI Lines: Dialysis Catheter and Arterial Line will d/c arterial line. Maintain HD catheter another day or two to make sure she stays orr RRT Foley:  Yes, and it is still needed Code Status:  full code Last date of multidisciplinary goals of care discussion [daughter [name] updated at bedside 5/2]  My time 34 min

## 2023-10-23 NOTE — Evaluation (Signed)
 Occupational Therapy Evaluation Patient Details Name: Kim Anthony [name]: [id number] DOB: 05-14-1951 Today's Date: 10/23/2023   History of Present Illness   73 y/o F presenting to ED on 4/28 with SOB, neck pain, found to be in septic shock with renal failure, started on CRRT, 4/29-5/2.  Recent admit 4/9-4/18 with heart failure exacerbation    PMH includes CHF, COPD, CAD, HLD, HTN, DVT/PE on warfarin, breast CA     Clinical Impressions Pt reports ind at baseline with ADLs, uses cane for mobility. Pt lives with family. Pt needing up to min A for ADLs, supervision for bed mobility and supervision for transfers with [facility]. Pt SpO2 down to mid 80s with questionable wave pleth, incr to 90s once seated EOB. Pt presenting with impairments listed below, will follow acutely. Recommend HHOT at d/c.     If plan is discharge home, recommend the following:   A little help with walking and/or transfers;A lot of help with bathing/dressing/bathroom;Assistance with cooking/housework;Direct supervision/assist for medications management;Direct supervision/assist for financial management;Assist for transportation;Help with stairs or ramp for entrance;Supervision due to cognitive status     Functional Status Assessment   Patient has had a recent decline in their functional status and demonstrates the ability to make significant improvements in function in a reasonable and predictable amount of time.     Equipment Recommendations   None recommended by OT     Recommendations for Other Services   PT consult     Precautions/Restrictions   Precautions Precautions: Fall Precaution/Restrictions Comments: watch O2 Restrictions Weight Bearing Restrictions Per Provider Order: No     Mobility Bed Mobility Overal bed mobility: Needs Assistance Bed Mobility: Supine to Sit     Supine to sit: Supervision          Transfers Overall transfer level: Needs assistance Equipment used: Straight  cane Transfers: Sit to/from Stand Sit to Stand: Supervision                  Balance Overall balance assessment: Mild deficits observed, not formally tested Sitting-balance support: Feet supported, No upper extremity supported Sitting balance-Leahy Scale: Good     Standing balance support: During functional activity Standing balance-Leahy Scale: Fair                             ADL either performed or assessed with clinical judgement   ADL Overall ADL's : Needs assistance/impaired Eating/Feeding: Set up;Sitting   Grooming: Set up;Standing   Upper Body Bathing: Minimal assistance;Sitting   Lower Body Bathing: Minimal assistance;Sitting/lateral leans;Sit to/from stand   Upper Body Dressing : Minimal assistance;Sitting   Lower Body Dressing: Minimal assistance;Sit to/from stand;Sitting/lateral leans   Toilet Transfer: Contact guard assist;Ambulation   Toileting- Clothing Manipulation and Hygiene: Contact guard assist       Functional mobility during ADLs: Contact guard assist;Cane       Vision Patient Visual Report: No change from baseline Vision Assessment?: No apparent visual deficits     Perception Perception: Not tested       Praxis Praxis: Not tested       Pertinent Vitals/Pain Pain Assessment Pain Assessment: No/denies pain     Extremity/Trunk Assessment Upper Extremity Assessment Upper Extremity Assessment: Overall WFL for tasks assessed   Lower Extremity Assessment Lower Extremity Assessment: Overall WFL for tasks assessed   Cervical / Trunk Assessment Cervical / Trunk Assessment: Kyphotic   Communication Communication Communication: No apparent difficulties   Cognition Arousal: Alert  Behavior During Therapy: WFL for tasks assessed/performed Cognition: No family/caregiver present to determine baseline                               Following commands: Intact       Cueing  General Comments   Cueing  Techniques: Verbal cues  SpO2 down to ?mid80s with poor pleth on 2L O2, incr to 90s at rest   Exercises     Shoulder Instructions      Home Living Family/patient expects to be discharged to:: Private residence Living Arrangements: Children Available Help at Discharge: Family;Available PRN/intermittently Type of Home: House Home Access: Stairs to enter [employer] of Steps: 8 Entrance Stairs-Rails: Right;Left;Can reach both Home Layout: One level     Bathroom Shower/Tub: Chief Strategy Officer: Handicapped height Bathroom Accessibility: Yes   Home Equipment: Rollator (4 wheels);Rolling Walker (2 wheels);Cane - single point;BSC/3in1;Shower seat;Grab bars - tub/shower   Additional Comments: does not wear O2 at home      Prior Functioning/Environment Prior Level of Function : Needs assist             Mobility Comments: ind; furniture walks ADLs Comments: daughter assists with  meds    OT Problem List: Decreased strength;Decreased range of motion;Decreased activity tolerance;Decreased coordination;Decreased cognition;Decreased safety awareness;Decreased knowledge of precautions;Cardiopulmonary status limiting activity;Impaired balance (sitting and/or standing)   OT Treatment/Interventions: Self-care/ADL training;Therapeutic exercise;Energy conservation;DME and/or AE instruction;Therapeutic activities;Patient/family education;Balance training      OT Goals(Current goals can be found in the care plan section)   Acute Rehab OT Goals Patient Stated Goal: none stated OT Goal Formulation: With patient Time For Goal Achievement: 11/06/23 Potential to Achieve Goals: Good ADL Goals Pt Will Perform Upper Body Dressing: with modified independence;sitting Pt Will Perform Lower Body Dressing: with modified independence;sitting/lateral leans;sit to/from stand Pt Will Transfer to Toilet: with modified independence;ambulating;regular height toilet Pt Will  Perform Tub/Shower Transfer: Tub transfer;Shower transfer;with modified independence;ambulating   OT Frequency:  Min 2X/week    Co-evaluation              AM-PAC OT "6 Clicks" Daily Activity     Outcome Measure Help from another person eating meals?: None Help from another person taking care of personal grooming?: A Little Help from another person toileting, which includes using toliet, bedpan, or urinal?: A Little Help from another person bathing (including washing, rinsing, drying)?: A Little Help from another person to put on and taking off regular upper body clothing?: A Little Help from another person to put on and taking off regular lower body clothing?: A Little 6 Click Score: 19   End of Session Equipment Utilized During Treatment: Gait belt Nurse Communication: Mobility status  Activity Tolerance: Patient tolerated treatment well Patient left: in bed;with call bell/phone within reach;with bed alarm set;with family/visitor present  OT Visit Diagnosis: Unsteadiness on feet (R26.81);Muscle weakness (generalized) (M62.81)                Time: 1610-9604 OT Time Calculation (min): 31 min Charges:  OT General Charges $OT Visit: 1 Visit OT Evaluation $OT Eval Moderate Complexity: 1 Mod  Shareece Bultman K, OTD, OTR/L SecureChat Preferred Acute Rehab [phone]   [name] 10/23/2023, 12:18 PM

## 2023-10-23 NOTE — Progress Notes (Signed)
 Kim Anthony is an 73 y.o. female COPD, MVR on coumadin , atrial fibrillation, chronic constipation on chronic opioids, s/p lt mastectomy w breast cancer, CKD3b (BL 1.4-1.9)recently admitted with heart failure, pulmonary HTN with severely reduced RV function diuresed. H/O poor compliance with diet and diuretics, on home torsemide  80mg  daily and at time of discharge her Cr was 1.92. p/w SOB, somnolence, AMS, neck pain.   Assessment/Plan:  AKI on CKD3b (BL 1.4-1.9) discharged recently with a cr of 1.92 in the setting previously with severe right sided heart failure. Hyperkalemia treated medically and at time of consultation she had received Lokelma  10gm x1. CXR showed cardiomegaly but no vascular congestion.- Hold losartan , torsemide , farxiga . CRRT (4/29-5/1).  Daughter [name] was updated  Great response with Lasix  120mg  x1 on 5/2; hold Lasix  for today and see where she is.  She might have been recovering on her own without the Lasix   If renal function stable trend in next 24 to 48 hours then can pull the dialysis catheter at that time  AMS possibly from sepsis with leukocytosis but certainly uremia could be contributing as well. Being treated with broad spectrum abx. Combined CHF with elevated BNP and h/o noncompliance and discharged on torsemide  80mg  daily from recent hospitalization.  Atrial fibrillation  on coumadin  HTN COPD  Subjective: Cooperative but at times angry, asking when she's going home. Denies f/c/n/v. Eating pudding this AM. Denies SOB   Chemistry and CBC: Creatinine, Ser  Date/Time Value Ref Range Status  10/23/2023 02:56 AM 1.44 (H) 0.44 - 1.00 mg/dL Final  40/98/1191 47:82 PM 1.31 (H) 0.44 - 1.00 mg/dL Final  95/62/1308 65:78 AM 1.13 (H) 0.44 - 1.00 mg/dL Final  46/96/2952 84:13 PM 0.85 0.44 - 1.00 mg/dL Final  24/40/1027 25:36 AM 0.87 0.44 - 1.00 mg/dL Final  64/40/3474 25:95 PM 1.10 (H) 0.44 - 1.00 mg/dL Final  63/87/5643 32:95 AM 1.75 (H) 0.44 - 1.00 mg/dL Final     Comment:    DELTA CHECK NOTED  10/19/2023 03:30 PM 3.88 (H) 0.44 - 1.00 mg/dL Final  18/84/1660 63:01 AM 4.07 (H) 0.44 - 1.00 mg/dL Final  60/03/9322 55:73 AM 4.10 (H) 0.44 - 1.00 mg/dL Final  22/07/5425 06:23 AM 4.04 (H) 0.44 - 1.00 mg/dL Final  76/28/3151 76:16 PM 1.85 (H) 0.57 - 1.00 mg/dL Final  07/37/1062 69:48 AM 1.92 (H) 0.44 - 1.00 mg/dL Final  54/62/7035 00:93 PM 2.13 (H) 0.44 - 1.00 mg/dL Final  81/82/9937 16:96 AM 2.33 (H) 0.44 - 1.00 mg/dL Final  78/93/8101 75:10 AM 1.73 (H) 0.44 - 1.00 mg/dL Final  25/85/2778 24:23 AM 1.47 (H) 0.44 - 1.00 mg/dL Final  53/61/4431 54:00 AM 1.43 (H) 0.44 - 1.00 mg/dL Final  86/76/1950 93:26 AM 1.39 (H) 0.44 - 1.00 mg/dL Final  71/24/5809 98:33 AM 1.97 (H) 0.44 - 1.00 mg/dL Final  82/50/5397 67:34 AM 2.04 (H) 0.44 - 1.00 mg/dL Final  19/37/9024 09:73 AM 1.91 (H) 0.44 - 1.00 mg/dL Final  53/29/9242 68:34 AM 1.94 (H) 0.44 - 1.00 mg/dL Final  19/62/2297 98:92 AM 2.64 (H) 0.44 - 1.00 mg/dL Final  11/94/1740 81:44 AM 2.70 (H) 0.44 - 1.00 mg/dL Final  81/85/6314 97:02 PM 2.67 (H) 0.44 - 1.00 mg/dL Final  63/78/5885 02:77 AM 2.23 (H) 0.44 - 1.00 mg/dL Final  41/28/7867 67:20 AM 1.46 (H) 0.44 - 1.00 mg/dL Final  94/70/9628 36:62 AM 1.85 (H) 0.44 - 1.00 mg/dL Final  94/76/5465 03:54 AM 1.66 (H) 0.44 - 1.00 mg/dL Final  65/68/1275 17:00 AM 1.92 (H)  0.44 - 1.00 mg/dL Final  82/95/6213 08:65 AM 1.76 (H) 0.44 - 1.00 mg/dL Final  78/46/9629 52:84 AM 1.92 (H) 0.44 - 1.00 mg/dL Final  13/24/4010 27:25 AM 1.88 (H) 0.44 - 1.00 mg/dL Final  36/64/4034 74:25 AM 1.41 (H) 0.44 - 1.00 mg/dL Final  95/63/8756 43:32 AM 1.46 (H) 0.44 - 1.00 mg/dL Final  95/18/8416 60:63 PM 1.45 (H) 0.44 - 1.00 mg/dL Final  01/60/1093 23:55 AM 1.32 (H) 0.44 - 1.00 mg/dL Final  73/22/0254 27:06 PM 1.39 (H) 0.44 - 1.00 mg/dL Final  23/76/2831 51:76 AM 1.23 (H) 0.44 - 1.00 mg/dL Final  16/12/3708 62:69 PM 1.40 (H) 0.44 - 1.00 mg/dL Final  48/54/6270 35:00 AM 1.32 (H) 0.44 - 1.00 mg/dL  Final  93/81/8299 37:16 AM 1.30 (H) 0.44 - 1.00 mg/dL Final  96/78/9381 01:75 AM 1.05 (H) 0.44 - 1.00 mg/dL Final  04/15/8526 78:24 PM 0.87 0.44 - 1.00 mg/dL Final  23/53/6144 31:54 AM 0.85 0.44 - 1.00 mg/dL Final  00/86/7619 50:93 PM 0.97 0.44 - 1.00 mg/dL Final  26/71/2458 09:98 PM 0.93 0.44 - 1.00 mg/dL Final  33/82/5053 97:67 AM 1.02 (H) 0.44 - 1.00 mg/dL Final  34/19/3790 24:09 PM 1.18 (H) 0.44 - 1.00 mg/dL Final  73/53/2992 42:68 AM 1.47 (H) 0.44 - 1.00 mg/dL Final  34/19/6222 97:98 AM 2.17 (H) 0.44 - 1.00 mg/dL Final   Recent Labs  Lab 10/20/23 0229 10/20/23 1513 10/21/23 0213 10/21/23 1603 10/22/23 0544 10/22/23 1801 10/23/23 0256  NA 133* 134* 135 137 139 137 140  K 4.2 3.9 3.9 3.5 3.6 3.2* 3.0*  CL 102 103 104 106 107 104 105  CO2 22 23 23 22 22  21* 25  GLUCOSE 107* 110* 122* 109* 102* 92 103*  BUN 45* 23 15 10 11 15 16   CREATININE 1.75* 1.10* 0.87 0.85 1.13* 1.31* 1.44*  CALCIUM  9.5 8.7* 8.6* 8.7* 9.0 9.4 9.1  PHOS 3.3 2.1* 2.0* 3.6 4.2 3.9 4.3   Recent Labs  Lab 10/19/23 0530 10/19/23 0600 10/19/23 0605 10/20/23 0229 10/21/23 0213 10/23/23 0256  WBC 17.5*  --   --  15.1* 11.6* 11.2*  HGB 10.7*   < > 11.2* 10.1* 9.4* 9.1*  HCT 36.7   < > 33.0* 33.7* 31.5* 29.9*  MCV 92.4  --   --  90.1 88.7 89.0  PLT 443*  --   --  448* 335 327   < > = values in this interval not displayed.   Liver Function Tests: Recent Labs  Lab 10/19/23 0523 10/19/23 1530 10/21/23 0213 10/21/23 1603 10/22/23 0544 10/22/23 1801 10/23/23 0256  AST 18  --  19  --   --   --   --   ALT 20  --  17  --   --   --   --   ALKPHOS 108  --  143*  --   --   --   --   BILITOT 0.8  --  1.4*  --   --   --   --   PROT 6.9  --  6.1*  --   --   --   --   ALBUMIN 3.5  3.5   < > 2.7*   < > 2.6* 2.8* 2.7*   < > = values in this interval not displayed.   No results for input(s): "LIPASE", "AMYLASE" in the last 168 hours. No results for input(s): "AMMONIA" in the last 168 hours. Cardiac  Enzymes: Recent Labs  Lab 10/19/23 [phone]  CKTOTAL 78   Iron  Studies: No results for input(s): "IRON ", "TIBC", "TRANSFERRIN", "FERRITIN" in the last 72 hours. PT/INR: @LABRCNTIP (inr:5)  Xrays/Other Studies: ) Results for orders placed or performed during the hospital encounter of 10/18/23 (from the past 48 hours)  Glucose, capillary     Status: Abnormal   Collection Time: 10/21/23 11:36 AM  Result Value Ref Range   Glucose-Capillary 119 (H) 70 - 99 mg/dL    Comment: Glucose reference range applies only to samples taken after fasting for at least 8 hours.  Glucose, capillary     Status: Abnormal   Collection Time: 10/21/23  3:06 PM  Result Value Ref Range   Glucose-Capillary 117 (H) 70 - 99 mg/dL    Comment: Glucose reference range applies only to samples taken after fasting for at least 8 hours.  Renal function panel (daily at 1600)     Status: Abnormal   Collection Time: 10/21/23  4:03 PM  Result Value Ref Range   Sodium 137 135 - 145 mmol/L   Potassium 3.5 3.5 - 5.1 mmol/L   Chloride 106 98 - 111 mmol/L   CO2 22 22 - 32 mmol/L   Glucose, Bld 109 (H) 70 - 99 mg/dL    Comment: Glucose reference range applies only to samples taken after fasting for at least 8 hours.   BUN 10 8 - 23 mg/dL   Creatinine, Ser 1.61 0.44 - 1.00 mg/dL   Calcium  8.7 (L) 8.9 - 10.3 mg/dL   Phosphorus  3.6 2.5 - 4.6 mg/dL   Albumin 2.6 (L) 3.5 - 5.0 g/dL   GFR, Estimated >09 >60 mL/min    Comment: (NOTE) Calculated using the CKD-EPI Creatinine Equation (2021)    Anion gap 9 5 - 15    Comment: Performed at [facility], [address] [place], [place] [address]  Glucose, capillary     Status: Abnormal   Collection Time: 10/21/23  7:40 PM  Result Value Ref Range   Glucose-Capillary 136 (H) 70 - 99 mg/dL    Comment: Glucose reference range applies only to samples taken after fasting for at least 8 hours.  Glucose, capillary     Status: Abnormal   Collection Time: 10/22/23 12:08 AM  Result  Value Ref Range   Glucose-Capillary 110 (H) 70 - 99 mg/dL    Comment: Glucose reference range applies only to samples taken after fasting for at least 8 hours.  Heparin  level (unfractionated)     Status: Abnormal   Collection Time: 10/22/23 12:48 AM  Result Value Ref Range   Heparin  Unfractionated 0.13 (L) 0.30 - 0.70 IU/mL    Comment: (NOTE) The clinical reportable range upper limit is being lowered to >1.10 to align with the FDA approved guidance for the current laboratory assay.  If heparin  results are below expected values, and patient dosage has  been confirmed, suggest follow up testing of antithrombin III levels. Performed at [facility], [address] [place], [place] [address]   Glucose, capillary     Status: None   Collection Time: 10/22/23  3:27 AM  Result Value Ref Range   Glucose-Capillary 95 70 - 99 mg/dL    Comment: Glucose reference range applies only to samples taken after fasting for at least 8 hours.  Protime-INR     Status: Abnormal   Collection Time: 10/22/23  5:44 AM  Result Value Ref Range   Prothrombin  Time 21.0 (H) 11.4 - 15.2 seconds   INR 1.8 (H) 0.8 -  1.2    Comment: (NOTE) INR goal varies based on device and disease states. Performed at [facility], [address], [place], [place] [address]   Renal function panel (daily at 0500)     Status: Abnormal   Collection Time: 10/22/23  5:44 AM  Result Value Ref Range   Sodium 139 135 - 145 mmol/L   Potassium 3.6 3.5 - 5.1 mmol/L   Chloride 107 98 - 111 mmol/L   CO2 22 22 - 32 mmol/L   Glucose, Bld 102 (H) 70 - 99 mg/dL    Comment: Glucose reference range applies only to samples taken after fasting for at least 8 hours.   BUN 11 8 - 23 mg/dL   Creatinine, Ser 8.41 (H) 0.44 - 1.00 mg/dL   Calcium  9.0 8.9 - 10.3 mg/dL   Phosphorus  4.2 2.5 - 4.6 mg/dL   Albumin 2.6 (L) 3.5 - 5.0 g/dL   GFR, Estimated 51 (L) >60 mL/min    Comment: (NOTE) Calculated using the CKD-EPI Creatinine Equation  (2021)    Anion gap 10 5 - 15    Comment: Performed at [facility], [address], [place], [place] [address]  Magnesium      Status: None   Collection Time: 10/22/23  5:44 AM  Result Value Ref Range   Magnesium  2.3 1.7 - 2.4 mg/dL    Comment: Performed at [facility], [address] [place] [address], [place], [place] [address]  Glucose, capillary     Status: Abnormal   Collection Time: 10/22/23  7:42 AM  Result Value Ref Range   Glucose-Capillary 102 (H) 70 - 99 mg/dL    Comment: Glucose reference range applies only to samples taken after fasting for at least 8 hours.  Heparin  level (unfractionated)     Status: None   Collection Time: 10/22/23  9:58 AM  Result Value Ref Range   Heparin  Unfractionated 0.34 0.30 - 0.70 IU/mL    Comment: (NOTE) The clinical reportable range upper limit is being lowered to >1.10 to align with the FDA approved guidance for the current laboratory assay.  If heparin  results are below expected values, and patient dosage has  been confirmed, suggest follow up testing of antithrombin III levels. Performed at [facility], [address], [place], [place] [address]   Glucose, capillary     Status: Abnormal   Collection Time: 10/22/23 11:20 AM  Result Value Ref Range   Glucose-Capillary 113 (H) 70 - 99 mg/dL    Comment: Glucose reference range applies only to samples taken after fasting for at least 8 hours.  Glucose, capillary     Status: Abnormal   Collection Time: 10/22/23  3:08 PM  Result Value Ref Range   Glucose-Capillary 102 (H) 70 - 99 mg/dL    Comment: Glucose reference range applies only to samples taken after fasting for at least 8 hours.  Renal function panel (daily at 1600)     Status: Abnormal   Collection Time: 10/22/23  6:01 PM  Result Value Ref Range   Sodium 137 135 - 145 mmol/L   Potassium 3.2 (L) 3.5 - 5.1 mmol/L   Chloride 104 98 - 111 mmol/L   CO2 21 (L) 22 - 32 mmol/L   Glucose, Bld 92 70 - 99 mg/dL    Comment: Glucose  reference range applies only to samples taken after fasting for at least 8 hours.   BUN 15 8 - 23 mg/dL   Creatinine, Ser 4.03 (  H) 0.44 - 1.00 mg/dL   Calcium  9.4 8.9 - 10.3 mg/dL   Phosphorus  3.9 2.5 - 4.6 mg/dL   Albumin 2.8 (L) 3.5 - 5.0 g/dL   GFR, Estimated 43 (L) >60 mL/min    Comment: (NOTE) Calculated using the CKD-EPI Creatinine Equation (2021)    Anion gap 12 5 - 15    Comment: Performed at [facility], [address] [place], [place] [address]  Heparin  level (unfractionated)     Status: None   Collection Time: 10/22/23  6:01 PM  Result Value Ref Range   Heparin  Unfractionated 0.35 0.30 - 0.70 IU/mL    Comment: (NOTE) The clinical reportable range upper limit is being lowered to >1.10 to align with the FDA approved guidance for the current laboratory assay.  If heparin  results are below expected values, and patient dosage has  been confirmed, suggest follow up testing of antithrombin III levels. Performed at [facility], [address] [place], [place] [address]   Glucose, capillary     Status: None   Collection Time: 10/22/23 11:24 PM  Result Value Ref Range   Glucose-Capillary 97 70 - 99 mg/dL    Comment: Glucose reference range applies only to samples taken after fasting for at least 8 hours.  Renal function panel (daily at 0500)     Status: Abnormal   Collection Time: 10/23/23  2:56 AM  Result Value Ref Range   Sodium 140 135 - 145 mmol/L   Potassium 3.0 (L) 3.5 - 5.1 mmol/L   Chloride 105 98 - 111 mmol/L   CO2 25 22 - 32 mmol/L   Glucose, Bld 103 (H) 70 - 99 mg/dL    Comment: Glucose reference range applies only to samples taken after fasting for at least 8 hours.   BUN 16 8 - 23 mg/dL   Creatinine, Ser 8.65 (H) 0.44 - 1.00 mg/dL   Calcium  9.1 8.9 - 10.3 mg/dL   Phosphorus  4.3 2.5 - 4.6 mg/dL   Albumin 2.7 (L) 3.5 - 5.0 g/dL   GFR, Estimated 38 (L) >60 mL/min    Comment: (NOTE) Calculated using the CKD-EPI Creatinine Equation (2021)     Anion gap 10 5 - 15    Comment: Performed at [facility], [address] [place], [place] [address]  Magnesium      Status: None   Collection Time: 10/23/23  2:56 AM  Result Value Ref Range   Magnesium  2.0 1.7 - 2.4 mg/dL    Comment: Performed at [facility], [address] [place], [place] [address]  Heparin  level (unfractionated)     Status: None   Collection Time: 10/23/23  2:56 AM  Result Value Ref Range   Heparin  Unfractionated 0.36 0.30 - 0.70 IU/mL    Comment: (NOTE) The clinical reportable range upper limit is being lowered to >1.10 to align with the FDA approved guidance for the current laboratory assay.  If heparin  results are below expected values, and patient dosage has  been confirmed, suggest follow up testing of antithrombin III levels. Performed at [facility], [address] [place], [place] [address]   Protime-INR     Status: Abnormal   Collection Time: 10/23/23  2:56 AM  Result Value Ref Range   Prothrombin  Time 21.8 (H) 11.4 - 15.2 seconds   INR 1.9 (H) 0.8 - 1.2    Comment: (NOTE) INR goal varies based on device and disease states. Performed at [facility], [address], [place], [place] [address]   CBC     Status: Abnormal   Collection Time: 10/23/23  2:56 AM  Result Value Ref Range   WBC 11.2 (H) 4.0 - 10.5 K/uL   RBC 3.36 (L) 3.87 - 5.11 MIL/uL   Hemoglobin 9.1 (L) 12.0 - 15.0 g/dL   HCT 84.1 (L) 32.4 - 40.1 %   MCV 89.0 80.0 - 100.0 fL   MCH 27.1 26.0 - 34.0 pg   MCHC 30.4 30.0 - 36.0 g/dL   RDW 02.7 (H) 25.3 - 66.4 %   Platelets 327 150 - 400 K/uL   nRBC 0.0 0.0 - 0.2 %    Comment: Performed at [facility], [address] [place], [place] [address]  Glucose, capillary     Status: Abnormal   Collection Time: 10/23/23  3:48 AM  Result Value Ref Range   Glucose-Capillary 102 (H) 70 - 99 mg/dL    Comment: Glucose reference range applies only to samples taken after fasting for at least 8 hours.  Glucose,  capillary     Status: Abnormal   Collection Time: 10/23/23  7:26 AM  Result Value Ref Range   Glucose-Capillary 115 (H) 70 - 99 mg/dL    Comment: Glucose reference range applies only to samples taken after fasting for at least 8 hours.   *Note: Due to a large number of results and/or encounters for the requested time period, some results have not been displayed. A complete set of results can be found in Results Review.   No results found.   PMH:   Past Medical History:  Diagnosis Date   Acute delirium 08/30/2023   Acute GI bleeding 08/18/2023   Arthritis    Asthma    Breast cancer (HCC) 2001   Left Breast Cancer   CHF (congestive heart failure) (HCC)    Closed fracture of right hip (HCC) 09/23/2022   Closed intertrochanteric fracture of right femur, initial encounter (HCC) 09/22/2022   COPD (chronic obstructive pulmonary disease) (HCC)    Coronary artery disease    GERD (gastroesophageal reflux disease)    History of mitral valve prosthesis 07/31/2023   History of mitral valve replacement with mechanical valve    HLD (hyperlipidemia)    Hypertension    Personal history of breast cancer    Left Breast Cancer     Pulmonary embolism (HCC) 2021    PSH:   Past Surgical History:  Procedure Laterality Date   APPLICATION OF A-CELL OF CHEST/ABDOMEN Left 01/27/2019   Procedure: APPLICATION OF A-CELL OF CHEST;  Surgeon: Thornell Flirt, DO;  Location: [facility] OR;  Service: Plastics;  Laterality: Left;   APPLICATION OF WOUND VAC N/A 01/27/2019   Procedure: APPLICATION OF WOUND VAC;  Surgeon: Thornell Flirt, DO;  Location: [facility] OR;  Service: Plastics;  Laterality: N/A;   BREAST SURGERY Left 2001   for CA   CARDIOVERSION N/A 02/26/2022   Procedure: CARDIOVERSION;  Surgeon: Wendie Hamburg, MD;  Location: [facility] ENDOSCOPY;  Service: Cardiovascular;  Laterality: N/A;   COLONOSCOPY WITH PROPOFOL  N/A 06/07/2018   Procedure: COLONOSCOPY WITH PROPOFOL ;  Surgeon: Baldo Bonds,  MD;  Location: [facility] ENDOSCOPY;  Service: Endoscopy;  Laterality: N/A;   DEBRIDEMENT AND CLOSURE WOUND Left 12/28/2018   Procedure: Debridement And Closure Wound;  Surgeon: Oza Blumenthal, MD;  Location: [facility] OR;  Service: General;  Laterality: Left;   ESOPHAGOGASTRODUODENOSCOPY (EGD) WITH PROPOFOL  N/A 08/24/2023   Procedure: ESOPHAGOGASTRODUODENOSCOPY (EGD) WITH PROPOFOL ;  Surgeon: Renaye Carp, DO;  Location: [facility] ENDOSCOPY;  Service: Gastroenterology;  Laterality: N/A;   INCISION AND DRAINAGE OF WOUND Left 01/27/2019   Procedure: IRRIGATION AND DEBRIDEMENT OF CHEST WALL;  Surgeon: Thornell Flirt, DO;  Location: [facility] OR;  Service: Plastics;  Laterality: Left;   INTRAMEDULLARY (IM) NAIL INTERTROCHANTERIC N/A 09/23/2022   Procedure: INTRAMEDULLARY (IM) NAIL INTERTROCHANTERIC;  Surgeon: Wes Hamman, MD;  Location: [facility] OR;  Service: Orthopedics;  Laterality: N/A;   IR FLUORO GUIDE CV LINE RIGHT  10/01/2023   IR US  GUIDE VASC ACCESS RIGHT  10/01/2023   IR VENO/EXT/UNI RIGHT  10/05/2023   KNEE ARTHROSCOPY WITH MEDIAL MENISECTOMY Left 08/06/2021   Procedure: KNEE ARTHROSCOPY WITH PARTIAL MEDIAL MENISECTOMY, DEBRIDEMENT;  Surgeon: Orvan Blanch, MD;  Location: [facility] ORS;  Service: Orthopedics;  Laterality: Left;  1 HR   LATISSIMUS FLAP TO BREAST Left 01/18/2019   Procedure: LEFT LATISSIMUS FLAP TO BREAST;  Surgeon: Thornell Flirt, DO;  Location: [facility] OR;  Service: Plastics;  Laterality: Left;   MASTECTOMY Left    MITRAL VALVE REPLACEMENT     mechanical   POLYPECTOMY  06/07/2018   Procedure: POLYPECTOMY;  Surgeon: Baldo Bonds, MD;  Location: [facility] ENDOSCOPY;  Service: Endoscopy;;   RIGHT HEART CATH N/A 08/20/2023   Procedure: RIGHT HEART CATH;  Surgeon: Mardell Shade, MD;  Location: [facility] INVASIVE CV LAB;  Service: Cardiovascular;  Laterality: N/A;   RIGHT/LEFT HEART CATH AND CORONARY ANGIOGRAPHY N/A 01/09/2021   Procedure: RIGHT/LEFT HEART CATH AND CORONARY ANGIOGRAPHY;  Surgeon:  Mardell Shade, MD;  Location: [facility] INVASIVE CV LAB;  Service: Cardiovascular;  Laterality: N/A;   WOUND DEBRIDEMENT Left 12/28/2018   Procedure: EXCISION OF LEFT CHRONIC CHEST WALL WOUND;  Surgeon: Oza Blumenthal, MD;  Location: [facility] OR;  Service: General;  Laterality: Left;    Allergies:  Allergies  Allergen Reactions   Lisinopril  Swelling   Acetaminophen -Codeine Itching   Propoxyphene Itching    Darvocet   Tape Rash    Plastic   Tramadol  Itching, Nausea And Vomiting and Rash    Medications:   Prior to Admission medications   Medication Sig Start Date End Date Taking? Authorizing Provider  albuterol  (VENTOLIN  HFA) 108 (90 Base) MCG/ACT inhaler Inhale 1 puff into the lungs every 4 (four) hours as needed for wheezing or shortness of breath. 09/14/23  Yes Juberg, Christopher, DO  colchicine  0.6 MG tablet Take 0.5 tablets (0.3 mg total) by mouth daily. 10/08/23  Yes Cleven Dallas, DO  FARXIGA  10 MG TABS tablet Take 1 tablet by mouth once daily 08/31/23  Yes Amoako, Prince, MD  fluticasone  (FLONASE ) 50 MCG/ACT nasal spray Place 2 sprays into both nostrils daily. Patient taking differently: Place 1 spray into both nostrils daily. 05/17/23 05/16/24 Yes [name], MD  Fluticasone -Umeclidin-Vilant (TRELEGY ELLIPTA ) 100-62.5-25 MCG/ACT AEPB Inhale 1 puff into the lungs daily. 09/14/23  Yes Juberg, Veryl Gottron, DO  gabapentin  (NEURONTIN ) 100 MG capsule Take 2 capsules (200 mg total) by mouth 3 (three) times daily. 10/08/23  Yes Cleven Dallas, DO  HYDROcodone -acetaminophen  (NORCO/VICODIN) 5-325 MG tablet Take 1 tablet by mouth every 4 (four) hours as needed for moderate pain (pain score 4-6). Diagnosis- chronic low back pain 09/17/23  Yes Lovorn, Megan, MD  hydrOXYzine  (ATARAX ) 10 MG tablet TAKE 1 TABLET BY MOUTH THREE TIMES DAILY AS NEEDED FOR ANXIETY 10/18/23  Yes Amoako, Prince, MD  losartan  (COZAAR ) 25 MG tablet Take 0.5 tablets (12.5 mg total) by mouth daily. 08/10/23 11/08/23 Yes  Milford, Camilo Cella  M, FNP  metoprolol  succinate (TOPROL -XL) 25 MG 24 hr tablet Take 1 tablet (25 mg total) by mouth 2 (two) times daily. 08/01/23  Yes Jayson Michael, MD  potassium chloride  (KLOR-CON  M) 10 MEQ tablet Take 2 tablets (20 mEq total) by mouth daily. 09/07/23 04/04/24 Yes Amoako, Prince, MD  senna-docusate (SENOKOT-S) 8.6-50 MG tablet Take 1 tablet by mouth at bedtime as needed for mild constipation. 10/08/23  Yes Cleven Dallas, DO  simvastatin  (ZOCOR ) 20 MG tablet Take 1 tablet (20 mg total) by mouth daily. 12/03/22  Yes Allana Arabia, MD  torsemide  (DEMADEX ) 20 MG tablet Take 4 tablets (80 mg total) by mouth daily. 10/08/23  Yes Cleven Dallas, DO  Vitamin D , Ergocalciferol , (DRISDOL ) 1.25 MG (50000 UNIT) CAPS capsule Take 1 capsule (50,000 Units total) by mouth every 7 (seven) days. 10/11/23  Yes Cleven Dallas, DO  warfarin (COUMADIN ) 5 MG tablet Take 1 tablet (5 mg total) by mouth daily at 4 PM. 10/08/23  Yes Cleven Dallas, DO  allopurinol  (ZYLOPRIM ) 100 MG tablet Take 0.5 tablets (50 mg total) by mouth every other day. Patient not taking: Reported on 10/19/2023 10/13/23   Malen Scudder, DO    Discontinued Meds:   Medications Discontinued During This Encounter  Medication Reason   warfarin (COUMADIN ) tablet 5 mg    warfarin (COUMADIN ) tablet 5 mg    Warfarin - Pharmacist Dosing Inpatient    piperacillin -tazobactam (ZOSYN ) IVPB 2.25 g    vancomycin  variable dose per unstable renal function (pharmacist dosing)    0.9 %  sodium chloride  infusion    vancomycin  (VANCOCIN ) IVPB 1000 mg/200 mL premix    budeson-glycopyrrolate -formoterol  (BREZTRI ) 160-9-4.8 MCG/ACT inhaler 2 puff    ipratropium-albuterol  (DUONEB) 0.5-2.5 (3) MG/3ML nebulizer solution 3 mL    sodium zirconium cyclosilicate  (LOKELMA ) packet 10 g    PrismaSol  BGK 2/3.5 infusion    PrismaSol  BGK 2/3.5 infusion    PrismaSol  BGK 2/3.5 infusion    famotidine  (PEPCID ) IVPB 20 mg premix    piperacillin -tazobactam (ZOSYN ) IVPB  3.375 g    norepinephrine  (LEVOPHED ) 4mg  in (0.016 mg/mL) premix infusion    famotidine  (PEPCID ) tablet 10 mg    prismasol  BGK 4/2.5 infusion    prismasol  BGK 4/2.5 infusion    prismasol  BGK 4/2.5 infusion    potassium chloride  SA (KLOR-CON  M) CR tablet 40 mEq    metoprolol  succinate (TOPROL -XL) 24 hr tablet 25 mg    dexmedetomidine  (PRECEDEX ) 400 MCG/100ML (4 mcg/mL) infusion Patient Transfer    Social History:  reports that she has been smoking cigarettes. She has a 3 pack-year smoking history. She has never used smokeless tobacco. She reports current alcohol use of about 3.0 standard drinks of alcohol per week. She reports that she does not use drugs.  Family History:   Family History  Problem Relation Age of Onset   Hypertension Mother    Cancer Father    Hypertension Father    Breast cancer Maternal Aunt 50   Heart attack Other     Blood pressure (!) 107/54, pulse 67, temperature (!) 97.5 F (36.4 C), temperature source Bladder, resp. rate 17, height 5\' 5"  (1.651 m), weight 75 kg, SpO2 100%. Physical Exam: General appearance: NAD, answering questions Head: NCAT Back: No CVA tenderness. Cardio: bradycardic GI: SNDNT+BS Extremities: edema none Pulses: 2+ and symmetric Skin: Skin color, texture, turgor normal. No rashes or lesions Access: RIJ temp    Patrick Boor, MD 10/23/2023, 10:18 AM

## 2023-10-24 DIAGNOSIS — N179 Acute kidney failure, unspecified: Secondary | ICD-10-CM | POA: Diagnosis not present

## 2023-10-24 DIAGNOSIS — N1831 Chronic kidney disease, stage 3a: Secondary | ICD-10-CM | POA: Diagnosis not present

## 2023-10-24 LAB — CULTURE, BLOOD (ROUTINE X 2)
Culture: NO GROWTH
Culture: NO GROWTH

## 2023-10-24 LAB — HEPARIN LEVEL (UNFRACTIONATED): Heparin Unfractionated: 0.39 [IU]/mL (ref 0.30–0.70)

## 2023-10-24 LAB — RENAL FUNCTION PANEL
Albumin: 2.7 g/dL — ABNORMAL LOW (ref 3.5–5.0)
Albumin: 2.7 g/dL — ABNORMAL LOW (ref 3.5–5.0)
Anion gap: 14 (ref 5–15)
Anion gap: 11 (ref 5–15)
BUN: 20 mg/dL (ref 8–23)
BUN: 20 mg/dL (ref 8–23)
CO2: 21 mmol/L — ABNORMAL LOW (ref 22–32)
CO2: 19 mmol/L — ABNORMAL LOW (ref 22–32)
Calcium: 9.3 mg/dL (ref 8.9–10.3)
Calcium: 9.1 mg/dL (ref 8.9–10.3)
Chloride: 102 mmol/L (ref 98–111)
Chloride: 104 mmol/L (ref 98–111)
Creatinine, Ser: 1.6 mg/dL — ABNORMAL HIGH (ref 0.44–1.00)
Creatinine, Ser: 1.5 mg/dL — ABNORMAL HIGH (ref 0.44–1.00)
GFR, Estimated: 34 mL/min — ABNORMAL LOW (ref >60)
GFR, Estimated: 37 mL/min — ABNORMAL LOW (ref >60)
Glucose, Bld: 84 mg/dL (ref 70–99)
Glucose, Bld: 107 mg/dL — ABNORMAL HIGH (ref 70–99)
Phosphorus: 3.6 mg/dL (ref 2.5–4.6)
Phosphorus: 3.4 mg/dL (ref 2.5–4.6)
Potassium: 3.5 mmol/L (ref 3.5–5.1)
Potassium: 4 mmol/L (ref 3.5–5.1)
Sodium: 137 mmol/L (ref 135–145)
Sodium: 134 mmol/L — ABNORMAL LOW (ref 135–145)

## 2023-10-24 LAB — CBC
HCT: 29 % — ABNORMAL LOW (ref 36.0–46.0)
Hemoglobin: 8.8 g/dL — ABNORMAL LOW (ref 12.0–15.0)
MCH: 27.3 pg (ref 26.0–34.0)
MCHC: 30.3 g/dL (ref 30.0–36.0)
MCV: 90.1 fL (ref 80.0–100.0)
Platelets: 296 10*3/uL (ref 150–400)
RBC: 3.22 MIL/uL — ABNORMAL LOW (ref 3.87–5.11)
RDW: 21.2 % — ABNORMAL HIGH (ref 11.5–15.5)
WBC: 11.4 10*3/uL — ABNORMAL HIGH (ref 4.0–10.5)
nRBC: 0 % (ref 0.0–0.2)

## 2023-10-24 LAB — MAGNESIUM: Magnesium: 1.9 mg/dL (ref 1.7–2.4)

## 2023-10-24 LAB — GLUCOSE, CAPILLARY
Glucose-Capillary: 86 mg/dL (ref 70–99)
Glucose-Capillary: 94 mg/dL (ref 70–99)
Glucose-Capillary: 117 mg/dL — ABNORMAL HIGH (ref 70–99)
Glucose-Capillary: 106 mg/dL — ABNORMAL HIGH (ref 70–99)
Glucose-Capillary: 125 mg/dL — ABNORMAL HIGH (ref 70–99)
Glucose-Capillary: 100 mg/dL — ABNORMAL HIGH (ref 70–99)

## 2023-10-24 LAB — PROTIME-INR
INR: 2.3 — ABNORMAL HIGH (ref 0.8–1.2)
Prothrombin Time: 25.7 s — ABNORMAL HIGH (ref 11.4–15.2)

## 2023-10-24 MED ORDER — WARFARIN SODIUM 3 MG PO TABS
3.0000 mg | ORAL_TABLET | Freq: Once | ORAL | Status: AC
  Administered 2023-10-24: 3 mg via ORAL
  Filled 2023-10-24: qty 1

## 2023-10-24 NOTE — Progress Notes (Signed)
 PHARMACY - ANTICOAGULATION CONSULT NOTE  Pharmacy Consult for Heparin  and Warfarin  Indication: atrial fibrillation with mechanical mitral valve   Allergies  Allergen Reactions   Lisinopril  Swelling   Acetaminophen -Codeine Itching   Propoxyphene Itching    Darvocet   Tape Rash    Plastic   Tramadol  Itching, Nausea And Vomiting and Rash    Patient Measurements: Height: 5\' 5"  (165.1 cm) Weight: 74.7 kg (164 lb 10.9 oz) IBW/kg (Calculated) : 57 HEPARIN  DW (KG): 72.6  Vital Signs: Temp: 98.1 F (36.7 C) (05/04 0800) Temp Source: Bladder (05/04 0800) BP: 123/70 (05/04 0845) Pulse Rate: 68 (05/04 0800)  Labs: Recent Labs    10/22/23 0544 10/22/23 0958 10/22/23 1801 10/23/23 0256 10/23/23 1821 10/24/23 0452  HGB  --   --   --  9.1*  --  8.8*  HCT  --   --   --  29.9*  --  29.0*  PLT  --   --   --  327  --  296  LABPROT 21.0*  --   --  21.8*  --  25.7*  INR 1.8*  --   --  1.9*  --  2.3*  HEPARINUNFRC  --    < > 0.35 0.36  --  0.39  CREATININE 1.13*  --  1.31* 1.44* 1.70* 1.60*   < > = values in this interval not displayed.    Estimated Creatinine Clearance: 31.7 mL/min (A) (by C-G formula based on SCr of 1.6 mg/dL (H)).   Assessment: Kim Anthony is a 73 y.o. year old female presented on 10/18/2023 with concern for AMS. On warfarin prior to admission for MVR and atrial fibrillation. Prior to admission regimen, 5 mg PO daily (total weekly dose 35 mg). Last dose prior to admission 10/17/23 PM. Pharmacy consulted for heparin  / warfarin management.   Heparin  level therapeutic and stable.  INR subtherapeutic but approaching goal.  Poor dietary intake.  No bleeding reported.  Goal of Therapy:  Heparin  level 0.3-0.7 units/ml INR 2.5-3.5    Monitor platelets by anticoagulation protocol: Yes   Plan:  Continue heparin  infusion at 1300 units/hr Coumadin  3mg  PO today Daily heparin  level, PT/ INR and CBC  Kim Anthony, PharmD, BCPS, BCCCP 10/24/2023, 9:27 AM

## 2023-10-24 NOTE — Progress Notes (Addendum)
                  Subjective:   Summary: 73 yo F with COPD on 2L home O2, atrial fibrillation, mechanical mitral valve admitted for acute on chronic CKDIIIb, encephalopathy and sepsis of unknown etiology. Patient was admitted to the ICU on 4/29 where she received CRRT from 4/29 to 5/1 and 5 day course of Zosyn  for sepsis. Patient was readmitted to IMTS on 5/4.   Hospital Day 5  Patient is feeling well today. She participated in PT/OT yesterday and has been tolerating po intake. She feels she is close to going home.   Objective:  Vital signs in last 24 hours: Vitals:   10/24/23 0441 10/24/23 0520 10/24/23 0600 10/24/23 0700  BP:  (!) 109/47 110/67 98/86  Pulse:   64 66  Resp:  20 18 (!) 22  Temp:  97.9 F (36.6 C) 98.2 F (36.8 C) 97.9 F (36.6 C)  TempSrc:      SpO2:   95% 96%  Weight: 74.7 kg     Height:       Supplemental O2: Nasal Cannula SpO2: 96 % O2 Flow Rate (L/min): 3 L/min  Physical Exam:  Constitutional: chronically ill appearing, no acute distress Cardiovascular: RRR Pulmonary/Chest: coarse breath sounds bilaterally, no wheezes Neuro: A&O x 3  Assessment/Plan:   AKI on CKDIIIb Continues to have good UOP with out yesterday without additional diuretics. Cr 1.6 today from baseline 1.4-1.9. Now off CRRT. Per nephrology, if renal function remains stable she may be able to have dialysis catheter pulled soon. Will not add additional diuretics unless recommended by nephrology. Approaching discharge - Nephrology following, appreciate recommendations.  - Cr at baseline  Sepsis, unknown source Completed 5 days of Zosyn  for sepsis and leukocytosis of unknown etiology. Currently afebrile, HDS, with resolved leukocytosis. Continue to monitor for symptoms, otherwise no further antibiotics at this time.   Encephalopathy Resolved, A&O x 3  Chronic HFpEF On home metoprolol , holding losartan  due to AKI on CKD. Clinically appears euvolemic.   Mechanical valve on  warfarin Atrial fibrillation Warfarin has been resumed, bridging with heparin  gtt. Appreciate pharmacy assistance.   T2DM Blood sugar has been well controlled with sensitive SSI  COPD on 2L home O2 Brovana , Pulmicort    Diet: Dys 3 VTE: Heparin  Code: Full  Dispo: Anticipated discharge to Home pending medical stability.   Jayson Michael, MD PGY-1 Internal Medicine Resident Pager Number [phone] Please contact the on call pager after 5 pm and on weekends at [phone].

## 2023-10-24 NOTE — Progress Notes (Signed)
 Kim Anthony is an 73 y.o. female COPD, MVR on coumadin , atrial fibrillation, chronic constipation on chronic opioids, s/p lt mastectomy w breast cancer, CKD3b (BL 1.4-1.9)recently admitted with heart failure, pulmonary HTN with severely reduced RV function diuresed. H/O poor compliance with diet and diuretics, on home torsemide  80mg  daily and at time of discharge her Cr was 1.92. p/w SOB, somnolence, AMS, neck pain.   Assessment/Plan:  AKI on CKD3b (BL 1.4-1.9) discharged recently with a cr of 1.92 in the setting previously with severe right sided heart failure. Hyperkalemia treated medically and at time of consultation she had received Lokelma  10gm x1. CXR showed cardiomegaly but no vascular congestion.- Hold losartan , torsemide , farxiga . CRRT (4/29-5/1).  Daughter Kim Anthony was updated  Great response with Lasix  120mg  x1 on 5/2; held Lasix  5/3. Less UOP (819ml/24h) but appears to be relatively euvolemic.  If renal function stable trend in next 24 to 48 hours then can pull the dialysis catheter at that time  AMS possibly from sepsis with leukocytosis but certainly uremia could be contributing as well. Being treated with broad spectrum abx. Combined CHF with elevated BNP and h/o noncompliance and discharged on torsemide  80mg  daily from recent hospitalization.  Atrial fibrillation  on coumadin  HTN COPD  Subjective: Cooperative. Denies f/c/n/v; good appetite.  Denies SOB   Chemistry and CBC: Creatinine, Ser  Date/Time Value Ref Range Status  10/24/2023 04:52 AM 1.60 (H) 0.44 - 1.00 mg/dL Final  16/03/9603 54:09 PM 1.70 (H) 0.44 - 1.00 mg/dL Final  81/19/1478 29:56 AM 1.44 (H) 0.44 - 1.00 mg/dL Final  21/30/8657 84:69 PM 1.31 (H) 0.44 - 1.00 mg/dL Final  62/95/2841 32:44 AM 1.13 (H) 0.44 - 1.00 mg/dL Final  06/24/7251 66:44 PM 0.85 0.44 - 1.00 mg/dL Final  03/47/4259 56:38 AM 0.87 0.44 - 1.00 mg/dL Final  75/64/3329 51:88 PM 1.10 (H) 0.44 - 1.00 mg/dL Final  41/66/0630 16:01 AM 1.75 (H)  0.44 - 1.00 mg/dL Final    Comment:    DELTA CHECK NOTED  10/19/2023 03:30 PM 3.88 (H) 0.44 - 1.00 mg/dL Final  09/32/3557 32:20 AM 4.07 (H) 0.44 - 1.00 mg/dL Final  25/42/7062 37:62 AM 4.10 (H) 0.44 - 1.00 mg/dL Final  83/15/1761 60:73 AM 4.04 (H) 0.44 - 1.00 mg/dL Final  71/11/2692 85:46 PM 1.85 (H) 0.57 - 1.00 mg/dL Final  27/08/5007 38:18 AM 1.92 (H) 0.44 - 1.00 mg/dL Final  29/93/7169 67:89 PM 2.13 (H) 0.44 - 1.00 mg/dL Final  38/03/1750 02:58 AM 2.33 (H) 0.44 - 1.00 mg/dL Final  52/77/8242 35:36 AM 1.73 (H) 0.44 - 1.00 mg/dL Final  14/43/1540 08:67 AM 1.47 (H) 0.44 - 1.00 mg/dL Final  61/95/0932 67:12 AM 1.43 (H) 0.44 - 1.00 mg/dL Final  45/80/9983 38:25 AM 1.39 (H) 0.44 - 1.00 mg/dL Final  05/39/7673 41:93 AM 1.97 (H) 0.44 - 1.00 mg/dL Final  79/07/4095 35:32 AM 2.04 (H) 0.44 - 1.00 mg/dL Final  99/24/2683 41:96 AM 1.91 (H) 0.44 - 1.00 mg/dL Final  22/29/7989 21:19 AM 1.94 (H) 0.44 - 1.00 mg/dL Final  41/74/0814 48:18 AM 2.64 (H) 0.44 - 1.00 mg/dL Final  56/31/4970 26:37 AM 2.70 (H) 0.44 - 1.00 mg/dL Final  85/88/5027 74:12 PM 2.67 (H) 0.44 - 1.00 mg/dL Final  87/86/7672 09:47 AM 2.23 (H) 0.44 - 1.00 mg/dL Final  09/62/8366 29:47 AM 1.46 (H) 0.44 - 1.00 mg/dL Final  65/46/5035 46:56 AM 1.85 (H) 0.44 - 1.00 mg/dL Final  81/27/5170 01:74 AM 1.66 (H) 0.44 - 1.00 mg/dL Final  94/49/6759 16:38  AM 1.92 (H) 0.44 - 1.00 mg/dL Final  16/03/9603 54:09 AM 1.76 (H) 0.44 - 1.00 mg/dL Final  81/19/1478 29:56 AM 1.92 (H) 0.44 - 1.00 mg/dL Final  21/30/8657 84:69 AM 1.88 (H) 0.44 - 1.00 mg/dL Final  62/95/2841 32:44 AM 1.41 (H) 0.44 - 1.00 mg/dL Final  06/24/7251 66:44 AM 1.46 (H) 0.44 - 1.00 mg/dL Final  03/47/4259 56:38 PM 1.45 (H) 0.44 - 1.00 mg/dL Final  75/64/3329 51:88 AM 1.32 (H) 0.44 - 1.00 mg/dL Final  41/66/0630 16:01 PM 1.39 (H) 0.44 - 1.00 mg/dL Final  09/32/3557 32:20 AM 1.23 (H) 0.44 - 1.00 mg/dL Final  25/42/7062 37:62 PM 1.40 (H) 0.44 - 1.00 mg/dL Final  83/15/1761 60:73  AM 1.32 (H) 0.44 - 1.00 mg/dL Final  71/11/2692 85:46 AM 1.30 (H) 0.44 - 1.00 mg/dL Final  27/08/5007 38:18 AM 1.05 (H) 0.44 - 1.00 mg/dL Final  29/93/7169 67:89 PM 0.87 0.44 - 1.00 mg/dL Final  38/03/1750 02:58 AM 0.85 0.44 - 1.00 mg/dL Final  52/77/8242 35:36 PM 0.97 0.44 - 1.00 mg/dL Final  14/43/1540 08:67 PM 0.93 0.44 - 1.00 mg/dL Final  61/95/0932 67:12 AM 1.02 (H) 0.44 - 1.00 mg/dL Final  45/80/9983 38:25 PM 1.18 (H) 0.44 - 1.00 mg/dL Final   Recent Labs  Lab 10/21/23 0213 10/21/23 1603 10/22/23 0544 10/22/23 1801 10/23/23 0256 10/23/23 1821 10/24/23 0452  NA 135 137 139 137 140 136 137  K 3.9 3.5 3.6 3.2* 3.0* 3.6 3.5  CL 104 106 107 104 105 102 102  CO2 23 22 22  21* 25 23 21*  GLUCOSE 122* 109* 102* 92 103* 87 84  BUN 15 10 11 15 16 18 20   CREATININE 0.87 0.85 1.13* 1.31* 1.44* 1.70* 1.60*  CALCIUM  8.6* 8.7* 9.0 9.4 9.1 9.0 9.3  PHOS 2.0* 3.6 4.2 3.9 4.3 4.2 3.6   Recent Labs  Lab 10/20/23 0229 10/21/23 0213 10/23/23 0256 10/24/23 0452  WBC 15.1* 11.6* 11.2* 11.4*  HGB 10.1* 9.4* 9.1* 8.8*  HCT 33.7* 31.5* 29.9* 29.0*  MCV 90.1 88.7 89.0 90.1  PLT 448* 335 327 296   Liver Function Tests: Recent Labs  Lab 10/19/23 0523 10/19/23 1530 10/21/23 0213 10/21/23 1603 10/23/23 0256 10/23/23 1821 10/24/23 0452  AST 18  --  19  --   --   --   --   ALT 20  --  17  --   --   --   --   ALKPHOS 108  --  143*  --   --   --   --   BILITOT 0.8  --  1.4*  --   --   --   --   PROT 6.9  --  6.1*  --   --   --   --   ALBUMIN 3.5  3.5   < > 2.7*   < > 2.7* 3.1* 2.7*   < > = values in this interval not displayed.   No results for input(s): "LIPASE", "AMYLASE" in the last 168 hours. No results for input(s): "AMMONIA" in the last 168 hours. Cardiac Enzymes: Recent Labs  Lab 10/19/23 0632  CKTOTAL 78   Iron  Studies: No results for input(s): "IRON ", "TIBC", "TRANSFERRIN", "FERRITIN" in the last 72 hours. PT/INR: @LABRCNTIP (inr:5)  Xrays/Other Studies: ) Results  for orders placed or performed during the hospital encounter of 10/18/23 (from the past 48 hours)  Glucose, capillary     Status: Abnormal   Collection Time: 10/22/23 11:20 AM  Result Value Ref  Range   Glucose-Capillary 113 (H) 70 - 99 mg/dL    Comment: Glucose reference range applies only to samples taken after fasting for at least 8 hours.  Glucose, capillary     Status: Abnormal   Collection Time: 10/22/23  3:08 PM  Result Value Ref Range   Glucose-Capillary 102 (H) 70 - 99 mg/dL    Comment: Glucose reference range applies only to samples taken after fasting for at least 8 hours.  Renal function panel (daily at 1600)     Status: Abnormal   Collection Time: 10/22/23  6:01 PM  Result Value Ref Range   Sodium 137 135 - 145 mmol/L   Potassium 3.2 (L) 3.5 - 5.1 mmol/L   Chloride 104 98 - 111 mmol/L   CO2 21 (L) 22 - 32 mmol/L   Glucose, Bld 92 70 - 99 mg/dL    Comment: Glucose reference range applies only to samples taken after fasting for at least 8 hours.   BUN 15 8 - 23 mg/dL   Creatinine, Ser 1.61 (H) 0.44 - 1.00 mg/dL   Calcium  9.4 8.9 - 10.3 mg/dL   Phosphorus  3.9 2.5 - 4.6 mg/dL   Albumin 2.8 (L) 3.5 - 5.0 g/dL   GFR, Estimated 43 (L) >60 mL/min    Comment: (NOTE) Calculated using the CKD-EPI Creatinine Equation (2021)    Anion gap 12 5 - 15    Comment: Performed at [facility], [address] [place], [place] [address]  Heparin  level (unfractionated)     Status: None   Collection Time: 10/22/23  6:01 PM  Result Value Ref Range   Heparin  Unfractionated 0.35 0.30 - 0.70 IU/mL    Comment: (NOTE) The clinical reportable range upper limit is being lowered to >1.10 to align with the FDA approved guidance for the current laboratory assay.  If heparin  results are below expected values, and patient dosage has  been confirmed, suggest follow up testing of antithrombin III levels. Performed at [facility], [address] [place], [place] [address]   Glucose,  capillary     Status: None   Collection Time: 10/22/23 11:24 PM  Result Value Ref Range   Glucose-Capillary 97 70 - 99 mg/dL    Comment: Glucose reference range applies only to samples taken after fasting for at least 8 hours.  Renal function panel (daily at 0500)     Status: Abnormal   Collection Time: 10/23/23  2:56 AM  Result Value Ref Range   Sodium 140 135 - 145 mmol/L   Potassium 3.0 (L) 3.5 - 5.1 mmol/L   Chloride 105 98 - 111 mmol/L   CO2 25 22 - 32 mmol/L   Glucose, Bld 103 (H) 70 - 99 mg/dL    Comment: Glucose reference range applies only to samples taken after fasting for at least 8 hours.   BUN 16 8 - 23 mg/dL   Creatinine, Ser 1.19 (H) 0.44 - 1.00 mg/dL   Calcium  9.1 8.9 - 10.3 mg/dL   Phosphorus  4.3 2.5 - 4.6 mg/dL   Albumin 2.7 (L) 3.5 - 5.0 g/dL   GFR, Estimated 38 (L) >60 mL/min    Comment: (NOTE) Calculated using the CKD-EPI Creatinine Equation (2021)    Anion gap 10 5 - 15    Comment: Performed at [facility], [address] [place], [place] [address]  Magnesium      Status: None   Collection Time: 10/23/23  2:56 AM  Result Value Ref Range  Magnesium  2.0 1.7 - 2.4 mg/dL    Comment: Performed at [facility], [address] [place], [place] [address]  Heparin  level (unfractionated)     Status: None   Collection Time: 10/23/23  2:56 AM  Result Value Ref Range   Heparin  Unfractionated 0.36 0.30 - 0.70 IU/mL    Comment: (NOTE) The clinical reportable range upper limit is being lowered to >1.10 to align with the FDA approved guidance for the current laboratory assay.  If heparin  results are below expected values, and patient dosage has  been confirmed, suggest follow up testing of antithrombin III levels. Performed at [facility], [address], [place], [place] [address]   Protime-INR     Status: Abnormal   Collection Time: 10/23/23  2:56 AM  Result Value Ref Range   Prothrombin  Time 21.8 (H) 11.4 - 15.2 seconds   INR 1.9 (H) 0.8 -  1.2    Comment: (NOTE) INR goal varies based on device and disease states. Performed at [facility], [address], [place], [place] [address]   CBC     Status: Abnormal   Collection Time: 10/23/23  2:56 AM  Result Value Ref Range   WBC 11.2 (H) 4.0 - 10.5 K/uL   RBC 3.36 (L) 3.87 - 5.11 MIL/uL   Hemoglobin 9.1 (L) 12.0 - 15.0 g/dL   HCT 91.4 (L) 78.2 - 95.6 %   MCV 89.0 80.0 - 100.0 fL   MCH 27.1 26.0 - 34.0 pg   MCHC 30.4 30.0 - 36.0 g/dL   RDW 21.3 (H) 08.6 - 57.8 %   Platelets 327 150 - 400 K/uL   nRBC 0.0 0.0 - 0.2 %    Comment: Performed at [facility], [address], [place], [place] [address]  Glucose, capillary     Status: Abnormal   Collection Time: 10/23/23  3:48 AM  Result Value Ref Range   Glucose-Capillary 102 (H) 70 - 99 mg/dL    Comment: Glucose reference range applies only to samples taken after fasting for at least 8 hours.  Glucose, capillary     Status: Abnormal   Collection Time: 10/23/23  7:26 AM  Result Value Ref Range   Glucose-Capillary 115 (H) 70 - 99 mg/dL    Comment: Glucose reference range applies only to samples taken after fasting for at least 8 hours.  Glucose, capillary     Status: None   Collection Time: 10/23/23 11:31 AM  Result Value Ref Range   Glucose-Capillary 98 70 - 99 mg/dL    Comment: Glucose reference range applies only to samples taken after fasting for at least 8 hours.  Renal function panel (daily at 1600)     Status: Abnormal   Collection Time: 10/23/23  6:21 PM  Result Value Ref Range   Sodium 136 135 - 145 mmol/L   Potassium 3.6 3.5 - 5.1 mmol/L   Chloride 102 98 - 111 mmol/L   CO2 23 22 - 32 mmol/L   Glucose, Bld 87 70 - 99 mg/dL    Comment: Glucose reference range applies only to samples taken after fasting for at least 8 hours.   BUN 18 8 - 23 mg/dL   Creatinine, Ser 9.52 (H) 0.44 - 1.00 mg/dL   Calcium  9.0 8.9 - 10.3 mg/dL   Phosphorus  4.2 2.5 - 4.6 mg/dL   Albumin 3.1 (L) 3.5 - 5.0 g/dL   GFR,  Estimated 31 (L) >60 mL/min  Comment: (NOTE) Calculated using the CKD-EPI Creatinine Equation (2021)    Anion gap 11 5 - 15    Comment: Performed at [facility], [address], [place], [place] [address]  Glucose, capillary     Status: Abnormal   Collection Time: 10/23/23  7:45 PM  Result Value Ref Range   Glucose-Capillary 108 (H) 70 - 99 mg/dL    Comment: Glucose reference range applies only to samples taken after fasting for at least 8 hours.  Glucose, capillary     Status: None   Collection Time: 10/23/23 11:48 PM  Result Value Ref Range   Glucose-Capillary 80 70 - 99 mg/dL    Comment: Glucose reference range applies only to samples taken after fasting for at least 8 hours.  Glucose, capillary     Status: None   Collection Time: 10/24/23  3:43 AM  Result Value Ref Range   Glucose-Capillary 86 70 - 99 mg/dL    Comment: Glucose reference range applies only to samples taken after fasting for at least 8 hours.  Renal function panel (daily at 0500)     Status: Abnormal   Collection Time: 10/24/23  4:52 AM  Result Value Ref Range   Sodium 137 135 - 145 mmol/L   Potassium 3.5 3.5 - 5.1 mmol/L   Chloride 102 98 - 111 mmol/L   CO2 21 (L) 22 - 32 mmol/L   Glucose, Bld 84 70 - 99 mg/dL    Comment: Glucose reference range applies only to samples taken after fasting for at least 8 hours.   BUN 20 8 - 23 mg/dL   Creatinine, Ser 2.54 (H) 0.44 - 1.00 mg/dL   Calcium  9.3 8.9 - 10.3 mg/dL   Phosphorus  3.6 2.5 - 4.6 mg/dL   Albumin 2.7 (L) 3.5 - 5.0 g/dL   GFR, Estimated 34 (L) >60 mL/min    Comment: (NOTE) Calculated using the CKD-EPI Creatinine Equation (2021)    Anion gap 14 5 - 15    Comment: Performed at [facility], [address] [place], [place] [address]  Magnesium      Status: None   Collection Time: 10/24/23  4:52 AM  Result Value Ref Range   Magnesium  1.9 1.7 - 2.4 mg/dL    Comment: Performed at [facility], [address], [place], [place] [address]   Heparin  level (unfractionated)     Status: None   Collection Time: 10/24/23  4:52 AM  Result Value Ref Range   Heparin  Unfractionated 0.39 0.30 - 0.70 IU/mL    Comment: (NOTE) The clinical reportable range upper limit is being lowered to >1.10 to align with the FDA approved guidance for the current laboratory assay.  If heparin  results are below expected values, and patient dosage has  been confirmed, suggest follow up testing of antithrombin III levels. Performed at [facility], [address], [place], [place] [address]   Protime-INR     Status: Abnormal   Collection Time: 10/24/23  4:52 AM  Result Value Ref Range   Prothrombin  Time 25.7 (H) 11.4 - 15.2 seconds   INR 2.3 (H) 0.8 - 1.2    Comment: (NOTE) INR goal varies based on device and disease states. Performed at [facility], [address] [place], [place] [address]   CBC     Status: Abnormal   Collection Time: 10/24/23  4:52 AM  Result Value Ref Range   WBC 11.4 (H) 4.0 - 10.5 K/uL  RBC 3.22 (L) 3.87 - 5.11 MIL/uL   Hemoglobin 8.8 (L) 12.0 - 15.0 g/dL   HCT 40.9 (L) 81.1 - 91.4 %   MCV 90.1 80.0 - 100.0 fL   MCH 27.3 26.0 - 34.0 pg   MCHC 30.3 30.0 - 36.0 g/dL   RDW 78.2 (H) 95.6 - 21.3 %   Platelets 296 150 - 400 K/uL   nRBC 0.0 0.0 - 0.2 %    Comment: Performed at [facility], [address], [place], [place] [address]  Glucose, capillary     Status: None   Collection Time: 10/24/23  7:14 AM  Result Value Ref Range   Glucose-Capillary 94 70 - 99 mg/dL    Comment: Glucose reference range applies only to samples taken after fasting for at least 8 hours.   *Note: Due to a large number of results and/or encounters for the requested time period, some results have not been displayed. A complete set of results can be found in Results Review.   No results found.   PMH:   Past Medical History:  Diagnosis Date   Acute delirium 08/30/2023   Acute GI bleeding 08/18/2023   Arthritis     Asthma    Breast cancer (HCC) 2001   Left Breast Cancer   CHF (congestive heart failure) (HCC)    Closed fracture of right hip (HCC) 09/23/2022   Closed intertrochanteric fracture of right femur, initial encounter (HCC) 09/22/2022   COPD (chronic obstructive pulmonary disease) (HCC)    Coronary artery disease    GERD (gastroesophageal reflux disease)    History of mitral valve prosthesis 07/31/2023   History of mitral valve replacement with mechanical valve    HLD (hyperlipidemia)    Hypertension    Personal history of breast cancer    Left Breast Cancer     Pulmonary embolism (HCC) 2021    PSH:   Past Surgical History:  Procedure Laterality Date   APPLICATION OF A-CELL OF CHEST/ABDOMEN Left 01/27/2019   Procedure: APPLICATION OF A-CELL OF CHEST;  Surgeon: Thornell Flirt, DO;  Location: [facility] OR;  Service: Plastics;  Laterality: Left;   APPLICATION OF WOUND VAC N/A 01/27/2019   Procedure: APPLICATION OF WOUND VAC;  Surgeon: Thornell Flirt, DO;  Location: [facility] OR;  Service: Plastics;  Laterality: N/A;   BREAST SURGERY Left 2001   for CA   CARDIOVERSION N/A 02/26/2022   Procedure: CARDIOVERSION;  Surgeon: Wendie Hamburg, MD;  Location: [facility] ENDOSCOPY;  Service: Cardiovascular;  Laterality: N/A;   COLONOSCOPY WITH PROPOFOL  N/A 06/07/2018   Procedure: COLONOSCOPY WITH PROPOFOL ;  Surgeon: Baldo Bonds, MD;  Location: [facility] ENDOSCOPY;  Service: Endoscopy;  Laterality: N/A;   DEBRIDEMENT AND CLOSURE WOUND Left 12/28/2018   Procedure: Debridement And Closure Wound;  Surgeon: Oza Blumenthal, MD;  Location: [facility] OR;  Service: General;  Laterality: Left;   ESOPHAGOGASTRODUODENOSCOPY (EGD) WITH PROPOFOL  N/A 08/24/2023   Procedure: ESOPHAGOGASTRODUODENOSCOPY (EGD) WITH PROPOFOL ;  Surgeon: Renaye Carp, DO;  Location: [facility] ENDOSCOPY;  Service: Gastroenterology;  Laterality: N/A;   INCISION AND DRAINAGE OF WOUND Left 01/27/2019   Procedure: IRRIGATION AND DEBRIDEMENT OF CHEST  WALL;  Surgeon: Thornell Flirt, DO;  Location: [facility] OR;  Service: Plastics;  Laterality: Left;   INTRAMEDULLARY (IM) NAIL INTERTROCHANTERIC N/A 09/23/2022   Procedure: INTRAMEDULLARY (IM) NAIL INTERTROCHANTERIC;  Surgeon: Wes Hamman, MD;  Location: [facility] OR;  Service: Orthopedics;  Laterality: N/A;   IR FLUORO GUIDE CV LINE RIGHT  10/01/2023  IR US  GUIDE VASC ACCESS RIGHT  10/01/2023   IR VENO/EXT/UNI RIGHT  10/05/2023   KNEE ARTHROSCOPY WITH MEDIAL MENISECTOMY Left 08/06/2021   Procedure: KNEE ARTHROSCOPY WITH PARTIAL MEDIAL MENISECTOMY, DEBRIDEMENT;  Surgeon: Orvan Blanch, MD;  Location: [facility] ORS;  Service: Orthopedics;  Laterality: Left;  1 HR   LATISSIMUS FLAP TO BREAST Left 01/18/2019   Procedure: LEFT LATISSIMUS FLAP TO BREAST;  Surgeon: Thornell Flirt, DO;  Location: [facility] OR;  Service: Plastics;  Laterality: Left;   MASTECTOMY Left    MITRAL VALVE REPLACEMENT     mechanical   POLYPECTOMY  06/07/2018   Procedure: POLYPECTOMY;  Surgeon: Baldo Bonds, MD;  Location: [facility] ENDOSCOPY;  Service: Endoscopy;;   RIGHT HEART CATH N/A 08/20/2023   Procedure: RIGHT HEART CATH;  Surgeon: Mardell Shade, MD;  Location: [facility] INVASIVE CV LAB;  Service: Cardiovascular;  Laterality: N/A;   RIGHT/LEFT HEART CATH AND CORONARY ANGIOGRAPHY N/A 01/09/2021   Procedure: RIGHT/LEFT HEART CATH AND CORONARY ANGIOGRAPHY;  Surgeon: Mardell Shade, MD;  Location: [facility] INVAS[facility] CV LAB;  Service: Cardiovascular;  Laterality: N/A;   WOUND DEBRIDEMENT Left 12/28/2018   Procedure: EXCISION OF LEFT CHRONIC CHEST WALL WOUND;  Surgeon: Oza Blumenthal, MD;  Location: [facility] OR;  Service: General;  Laterality: Left;    Allergies:  Allergies  Allergen Reactions   Lisinopril  Swelling   Acetaminophen -Codeine Itching   Propoxyphene Itching    Darvocet   Tape Rash    Plastic   Tramadol  Itching, Nausea And Vomiting and Rash    Medications:   Prior to Admission medications   Medication Sig Start Date End  Date Taking? Authorizing Provider  albuterol  (VENTOLIN  HFA) 108 (90 Base) MCG/ACT inhaler Inhale 1 puff into the lungs every 4 (four) hours as needed for wheezing or shortness of breath. 09/14/23  Yes Juberg, Christopher, DO  colchicine  0.6 MG tablet Take 0.5 tablets (0.3 mg total) by mouth daily. 10/08/23  Yes Cleven Dallas, DO  FARXIGA  10 MG TABS tablet Take 1 tablet by mouth once daily 08/31/23  Yes Amoako, Prince, MD  fluticasone  (FLONASE ) 50 MCG/ACT nasal spray Place 2 sprays into both nostrils daily. Patient taking differently: Place 1 spray into both nostrils daily. 05/17/23 05/16/24 Yes Amoako, Prince, MD  Fluticasone -Umeclidin-Vilant (TRELEGY ELLIPTA ) 100-62.5-25 MCG/ACT AEPB Inhale 1 puff into the lungs daily. 09/14/23  Yes Juberg, Veryl Gottron, DO  gabapentin  (NEURONTIN ) 100 MG capsule Take 2 capsules (200 mg total) by mouth 3 (three) times daily. 10/08/23  Yes Cleven Dallas, DO  HYDROcodone -acetaminophen  (NORCO/VICODIN) 5-325 MG tablet Take 1 tablet by mouth every 4 (four) hours as needed for moderate pain (pain score 4-6). Diagnosis- chronic low back pain 09/17/23  Yes Lovorn, Megan, MD  hydrOXYzine  (ATARAX ) 10 MG tablet TAKE 1 TABLET BY MOUTH THREE TIMES DAILY AS NEEDED FOR ANXIETY 10/18/23  Yes Amoako, Prince, MD  losartan  (COZAAR ) 25 MG tablet Take 0.5 tablets (12.5 mg total) by mouth daily. 08/10/23 11/08/23 Yes Milford, Arlice Bene, FNP  metoprolol  succinate (TOPROL -XL) 25 MG 24 hr tablet Take 1 tablet (25 mg total) by mouth 2 (two) times daily. 08/01/23  Yes Jayson Michael, MD  potassium chloride  (KLOR-CON  M) 10 MEQ tablet Take 2 tablets (20 mEq total) by mouth daily. 09/07/23 04/04/24 Yes Amoako, Prince, MD  senna-docusate (SENOKOT-S) 8.6-50 MG tablet Take 1 tablet by mouth at bedtime as needed for mild constipation. 10/08/23  Yes Cleven Dallas, DO  simvastatin  (ZOCOR ) 20 MG tablet Take 1 tablet (20 mg total) by mouth daily. 12/03/22  Yes Allana Arabia, MD  torsemide  (DEMADEX ) 20 MG tablet Take  4 tablets (80 mg total) by mouth daily. 10/08/23  Yes Cleven Dallas, DO  Vitamin D , Ergocalciferol , (DRISDOL ) 1.25 MG (50000 UNIT) CAPS capsule Take 1 capsule (50,000 Units total) by mouth every 7 (seven) days. 10/11/23  Yes Cleven Dallas, DO  warfarin (COUMADIN ) 5 MG tablet Take 1 tablet (5 mg total) by mouth daily at 4 PM. 10/08/23  Yes Cleven Dallas, DO  allopurinol  (ZYLOPRIM ) 100 MG tablet Take 0.5 tablets (50 mg total) by mouth every other day. Patient not taking: Reported on 10/19/2023 10/13/23   Malen Scudder, DO    Discontinued Meds:   Medications Discontinued During This Encounter  Medication Reason   warfarin (COUMADIN ) tablet 5 mg    warfarin (COUMADIN ) tablet 5 mg    Warfarin - Pharmacist Dosing Inpatient    piperacillin -tazobactam (ZOSYN ) IVPB 2.25 g    vancomycin  variable dose per unstable renal function (pharmacist dosing)    0.9 %  sodium chloride  infusion    vancomycin  (VANCOCIN ) IVPB 1000 mg/200 mL premix    budeson-glycopyrrolate -formoterol  (BREZTRI ) 160-9-4.8 MCG/ACT inhaler 2 puff    ipratropium-albuterol  (DUONEB) 0.5-2.5 (3) MG/3ML nebulizer solution 3 mL    sodium zirconium cyclosilicate  (LOKELMA ) packet 10 g    PrismaSol  BGK 2/3.5 infusion    PrismaSol  BGK 2/3.5 infusion    PrismaSol  BGK 2/3.5 infusion    famotidine  (PEPCID ) IVPB 20 mg premix    piperacillin -tazobactam (ZOSYN ) IVPB 3.375 g    norepinephrine  (LEVOPHED ) 4mg  in (0.016 mg/mL) premix infusion    famotidine  (PEPCID ) tablet 10 mg    prismasol  BGK 4/2.5 infusion    prismasol  BGK 4/2.5 infusion    prismasol  BGK 4/2.5 infusion    potassium chloride  SA (KLOR-CON  M) CR tablet 40 mEq    metoprolol  succinate (TOPROL -XL) 24 hr tablet 25 mg    dexmedetomidine  (PRECEDEX ) 400 MCG/100ML (4 mcg/mL) infusion Patient Transfer   QUEtiapine  (SEROQUEL ) tablet 25 mg    heparin  injection 1,000-6,000 Units Completed Course   sodium chloride  0.9 % primer fluid for CRRT Completed Course   hydrALAZINE  (APRESOLINE )  injection 10-20 mg     Social History:  reports that she has been smoking cigarettes. She has a 3 pack-year smoking history. She has never used smokeless tobacco. She reports current alcohol use of about 3.0 standard drinks of alcohol per week. She reports that she does not use drugs.  Family History:   Family History  Problem Relation Age of Onset   Hypertension Mother    Cancer Father    Hypertension Father    Breast cancer Maternal Aunt 50   Heart attack Other     Blood pressure 123/70, pulse 68, temperature 98.1 F (36.7 C), temperature source Bladder, resp. rate (!) 22, height 5\' 5"  (1.651 m), weight 74.7 kg, SpO2 95%. Physical Exam: General appearance: NAD, answering questions Head: NCAT Back: No CVA tenderness. Cardio: bradycardic GI: SNDNT+BS Extremities: edema none Pulses: 2+ and symmetric Skin: Skin color, texture, turgor normal. No rashes or lesions Access: RIJ temp    Patrick Boor, MD 10/24/2023, 10:19 AM

## 2023-10-24 NOTE — Plan of Care (Signed)

## 2023-10-25 ENCOUNTER — Encounter (HOSPITAL_COMMUNITY): Payer: Self-pay | Admitting: Internal Medicine

## 2023-10-25 ENCOUNTER — Telehealth: Payer: Self-pay | Admitting: *Deleted

## 2023-10-25 DIAGNOSIS — N1831 Chronic kidney disease, stage 3a: Secondary | ICD-10-CM | POA: Diagnosis not present

## 2023-10-25 DIAGNOSIS — N179 Acute kidney failure, unspecified: Secondary | ICD-10-CM | POA: Diagnosis not present

## 2023-10-25 LAB — RENAL FUNCTION PANEL
Albumin: 2.7 g/dL — ABNORMAL LOW (ref 3.5–5.0)
Albumin: 2.8 g/dL — ABNORMAL LOW (ref 3.5–5.0)
Anion gap: 8 (ref 5–15)
Anion gap: 12 (ref 5–15)
BUN: 21 mg/dL (ref 8–23)
BUN: 25 mg/dL — ABNORMAL HIGH (ref 8–23)
CO2: 24 mmol/L (ref 22–32)
CO2: 21 mmol/L — ABNORMAL LOW (ref 22–32)
Calcium: 9 mg/dL (ref 8.9–10.3)
Calcium: 9.2 mg/dL (ref 8.9–10.3)
Chloride: 102 mmol/L (ref 98–111)
Chloride: 101 mmol/L (ref 98–111)
Creatinine, Ser: 1.57 mg/dL — ABNORMAL HIGH (ref 0.44–1.00)
Creatinine, Ser: 1.51 mg/dL — ABNORMAL HIGH (ref 0.44–1.00)
GFR, Estimated: 34 mL/min — ABNORMAL LOW (ref >60)
GFR, Estimated: 36 mL/min — ABNORMAL LOW (ref >60)
Glucose, Bld: 102 mg/dL — ABNORMAL HIGH (ref 70–99)
Glucose, Bld: 116 mg/dL — ABNORMAL HIGH (ref 70–99)
Phosphorus: 3.6 mg/dL (ref 2.5–4.6)
Phosphorus: 3.4 mg/dL (ref 2.5–4.6)
Potassium: 3.6 mmol/L (ref 3.5–5.1)
Potassium: 3.4 mmol/L — ABNORMAL LOW (ref 3.5–5.1)
Sodium: 134 mmol/L — ABNORMAL LOW (ref 135–145)
Sodium: 134 mmol/L — ABNORMAL LOW (ref 135–145)

## 2023-10-25 LAB — CBC
HCT: 28.2 % — ABNORMAL LOW (ref 36.0–46.0)
Hemoglobin: 8.4 g/dL — ABNORMAL LOW (ref 12.0–15.0)
MCH: 27.4 pg (ref 26.0–34.0)
MCHC: 29.8 g/dL — ABNORMAL LOW (ref 30.0–36.0)
MCV: 91.9 fL (ref 80.0–100.0)
Platelets: 295 10*3/uL (ref 150–400)
RBC: 3.07 MIL/uL — ABNORMAL LOW (ref 3.87–5.11)
RDW: 20.7 % — ABNORMAL HIGH (ref 11.5–15.5)
WBC: 9.7 10*3/uL (ref 4.0–10.5)
nRBC: 0 % (ref 0.0–0.2)

## 2023-10-25 LAB — GLUCOSE, CAPILLARY
Glucose-Capillary: 106 mg/dL — ABNORMAL HIGH (ref 70–99)
Glucose-Capillary: 97 mg/dL (ref 70–99)
Glucose-Capillary: 108 mg/dL — ABNORMAL HIGH (ref 70–99)
Glucose-Capillary: 115 mg/dL — ABNORMAL HIGH (ref 70–99)
Glucose-Capillary: 75 mg/dL (ref 70–99)
Glucose-Capillary: 131 mg/dL — ABNORMAL HIGH (ref 70–99)

## 2023-10-25 LAB — PROTIME-INR
INR: 2.9 — ABNORMAL HIGH (ref 0.8–1.2)
Prothrombin Time: 30.7 s — ABNORMAL HIGH (ref 11.4–15.2)

## 2023-10-25 LAB — MAGNESIUM: Magnesium: 2.1 mg/dL (ref 1.7–2.4)

## 2023-10-25 LAB — HEPARIN LEVEL (UNFRACTIONATED): Heparin Unfractionated: 0.45 [IU]/mL (ref 0.30–0.70)

## 2023-10-25 MED ORDER — GUAIFENESIN ER 600 MG PO TB12: 600.0000 mg | ORAL_TABLET | Freq: Two times a day (BID) | ORAL | Status: DC | PRN

## 2023-10-25 MED ORDER — WARFARIN SODIUM 2.5 MG PO TABS
2.5000 mg | ORAL_TABLET | Freq: Once | ORAL | Status: AC
  Administered 2023-10-25: 2.5 mg via ORAL
  Filled 2023-10-25: qty 1

## 2023-10-25 MED ORDER — TORSEMIDE 20 MG PO TABS
40.0000 mg | ORAL_TABLET | Freq: Every day | ORAL | Status: DC
  Administered 2023-10-25 – 2023-10-26 (×2): 40 mg via ORAL
  Filled 2023-10-25 (×2): qty 2

## 2023-10-25 NOTE — Progress Notes (Addendum)
 Patient ID: Kim Anthony, female   DOB: 1950-12-27, 73 y.o.   [name]: [id number] [facility] Progress Note   Assessment/ Plan:   1. Acute kidney Injury on chronic kidney disease stage IIIb: Recent hospitalization for severe right-sided heart failure/pulmonary hypertension treated with diuresis and discharged with creatinine higher than previous baseline.  Readmitted with acute kidney injury that is suspected to be hemodynamically mediated.  Good response to interventions so far with stable renal function and without additional indications for CRRT/dialysis.  I am discouraged by charted urine output with weight pending from this morning- restart torsemide  40mg  daily. 2.  Sepsis/encephalopathy: Unclear etiology/source and status post completion of 5 days of Zosyn . 3.  Chronic diastolic heart failure with pulmonary hypertension: Holding ARB in the setting of acute kidney injury/relative hypotension.  Tolerating diuretics without problems. 4.  Hyponatremia: Continue to limit free water and monitor on diuretics- restart torsemide  40.  Subjective:   Foley catheter discontinued this morning and feels like she needs to void urine.   Objective:   BP 101/65   Pulse 76   Temp 97.7 F (36.5 C)   Resp 19   Ht 5\' 5"  (1.651 m)   Wt 74.7 kg   SpO2 99%   BMI 27.40 kg/m   Intake/Output Summary (Last 24 hours) at 10/25/2023 8657 Last data filed at 10/25/2023 0536 Gross per 24 hour  Intake 330.14 ml  Output 235 ml  Net 95.14 ml   Weight change:   Physical Exam: Gen: Comfortably resting in bed, daughter at bedside CVS: Pulse regular rhythm, normal rate, S1 and S2 normal.  Right IJ temporary dialysis catheter in place Resp: Diminished breath sounds over bases, poor inspiratory effort.  Rales/rhonchi Abd: Soft, flat, nontender, bowel sounds normal Ext: No lower extremity edema.  Imaging: No results found.  Labs: BMET Recent Labs  Lab 10/22/23 0544 10/22/23 1801 10/23/23 0256  10/23/23 1821 10/24/23 0452 10/24/23 1655 10/25/23 0421  NA 139 137 140 136 137 134* 134*  K 3.6 3.2* 3.0* 3.6 3.5 4.0 3.6  CL 107 104 105 102 102 104 102  CO2 22 21* 25 23 21* 19* 24  GLUCOSE 102* 92 103* 87 84 107* 102*  BUN 11 15 16 18 20 20 21   CREATININE 1.13* 1.31* 1.44* 1.70* 1.60* 1.50* 1.57*  CALCIUM  9.0 9.4 9.1 9.0 9.3 9.1 9.0  PHOS 4.2 3.9 4.3 4.2 3.6 3.4 3.6   CBC Recent Labs  Lab 10/21/23 0213 10/23/23 0256 10/24/23 0452 10/25/23 0421  WBC 11.6* 11.2* 11.4* 9.7  HGB 9.4* 9.1* 8.8* 8.4*  HCT 31.5* 29.9* 29.0* 28.2*  MCV 88.7 89.0 90.1 91.9  PLT 335 327 296 295    Medications:     (feeding supplement) PROSource Plus  30 mL Oral TID BM   arformoterol   15 mcg Nebulization BID   budesonide  (PULMICORT ) nebulizer solution  0.5 mg Nebulization BID   Chlorhexidine  Gluconate Cloth  6 each Topical Q0600   famotidine   20 mg Oral Daily   feeding supplement  237 mL Oral BID BM   gabapentin   200 mg Oral TID   insulin  aspart  1-3 Units Subcutaneous Q4H   metoprolol  tartrate  25 mg Oral BID   multivitamin  1 tablet Oral QHS   mouth rinse  15 mL Mouth Rinse 4 times per day   revefenacin   175 mcg Nebulization Daily   warfarin  2.5 mg Oral ONCE-1600   Warfarin - Pharmacist Dosing Inpatient   Does not apply q1600  Clevester Dally, MD 10/25/2023, 8:32 AM

## 2023-10-25 NOTE — Progress Notes (Signed)
 PHARMACY - ANTICOAGULATION CONSULT NOTE  Pharmacy Consult for Heparin  and Warfarin  Indication: atrial fibrillation with mechanical mitral valve   Allergies  Allergen Reactions   Lisinopril  Swelling   Acetaminophen -Codeine Itching   Propoxyphene Itching    Darvocet   Tape Rash    Plastic   Tramadol  Itching, Nausea And Vomiting and Rash    Patient Measurements: Height: 5\' 5"  (165.1 cm) Weight: 74.7 kg (164 lb 10.9 oz) IBW/kg (Calculated) : 57 HEPARIN  DW (KG): 72.6  Vital Signs: Temp: 97.7 F (36.5 C) (05/05 0000) BP: 101/65 (05/05 0535) Pulse Rate: 76 (05/05 0000)  Labs: Recent Labs    10/23/23 0256 10/23/23 1821 10/24/23 0452 10/24/23 1655 10/25/23 0421  HGB 9.1*  --  8.8*  --  8.4*  HCT 29.9*  --  29.0*  --  28.2*  PLT 327  --  296  --  295  LABPROT 21.8*  --  25.7*  --  30.7*  INR 1.9*  --  2.3*  --  2.9*  HEPARINUNFRC 0.36  --  0.39  --  0.45  CREATININE 1.44*   < > 1.60* 1.50* 1.57*   < > = values in this interval not displayed.    Estimated Creatinine Clearance: 31.8 mL/min (A) (by C-G formula based on SCr of 1.57 mg/dL (H)).   Assessment: Kim Anthony is a 73 y.o. year old female presented on 10/18/2023 with concern for AMS. On warfarin prior to admission for MVR and atrial fibrillation. Prior to admission regimen, 5 mg PO daily (total weekly dose 35 mg). Last dose prior to admission 10/17/23 PM. Pharmacy consulted for heparin  / warfarin management.   5/5: INR today therapeutic x 1 at 2.9, following 3 doses (2, 4, 3 mg)- Documented PO intake relatively low at 10-15%, explaining lower doses than PTA regimen contributing to rise in INR. Given rapid rise in INR following a couple of doses, will give slightly lower dose today to avoid supratherapeutic INR over next few days. Given INR only therapeutic x1, will continue heparin  bridge until demonstrated therapeutic INR x 24 hours. Heparin  level therapeutic at 0.45 with heparin  running at 1,300 u/h. No signs of  bleeding or issues with the heparin  infusion noted. CBC stable- Hgb 8s, PLT high 200s.   Goal of Therapy:  Heparin  level 0.3-0.7 units/ml INR 2.5-3.5    Monitor platelets by anticoagulation protocol: Yes   Plan:  Continue heparin  infusion at 1300 units/hr Coumadin  2.5 mg PO today Daily heparin  level, PT/ INR and CBC  Chrystie Crass, PharmD Clinical Pharmacist  10/25/2023 8:15 AM

## 2023-10-25 NOTE — Telephone Encounter (Signed)
 Copied from CRM [id number]. Topic: Clinical - Medical Advice >> Oct 25, 2023  8:05 AM Kim Anthony wrote: Reason for CRM: Patient is in dialysis at the moment and she is needing a doctor or nurse to come help her use the restroom because she is unable to at the moment, called three times but didn't get an answer, patients callback number is [phone].

## 2023-10-25 NOTE — Plan of Care (Signed)

## 2023-10-25 NOTE — Progress Notes (Signed)
 Physical Therapy Treatment Patient Details Name: Kim Anthony [name]: [id number] DOB: 1950/07/26 Today's Date: 10/25/2023   History of Present Illness 73 y/o F presenting to ED on 4/28 with SOB, neck pain, found to be in septic shock with renal failure, started on CRRT, 4/29-5/2. Recent admit 4/9-4/18 with heart failure exacerbation. PMH includes CHF, COPD, CAD, HLD, HTN, DVT/PE on warfarin, breast CA    PT Comments  Pt received in recliner, slouched down in chair, A&O but pt c/o purewick malfunction and agreeable to participate in therapy session to work on Mudlogger. Pt defers hallway distance gait trial or stair training today due to discomfort from purewick leaking, RN notified pt requesting a bath after session. Pt SpO2 WFL on 2L O2 [place] with limited activity in her room, HR also WFL . Pt needing up to minA for sit>stand from chair using cane and CGA for lateral seated scooting toward HOB and bed mobility. Pt continues to benefit from PT services to progress toward functional mobility goals, plan to bring step to her room next session to progress BLE strength and assess whether pt will be able to ascend stairs for home upon DC.   If plan is discharge home, recommend the following: Assistance with cooking/housework;Assist for transportation;Help with stairs or ramp for entrance;A little help with walking and/or transfers   Can travel by private vehicle     Yes  Equipment Recommendations  None recommended by PT    Recommendations for Other Services       Precautions / Restrictions Precautions Precautions: Fall Recall of Precautions/Restrictions: Intact Precaution/Restrictions Comments: watch O2 Restrictions Weight Bearing Restrictions Per Provider Order: No     Mobility  Bed Mobility Overal bed mobility: Needs Assistance Bed Mobility: Sit to Supine       Sit to supine: Contact guard assist, Used rails   General bed mobility comments: No assist needed to lift legs  over EOB but needs cues for improved body positioning in bed once supine, pt performs bridges x3 reps in supine    Transfers Overall transfer level: Needs assistance Equipment used: Straight cane Transfers: Sit to/from Stand, Bed to chair/wheelchair/BSC Sit to Stand: Min assist   Step pivot transfers: Contact guard assist       General transfer comment: minA initially to steady, then CGA for line management to pivot to bed from chair    Ambulation/Gait               General Gait Details: pt defers due to purewick malfunction and wanting a bed bath after session   Stairs             Wheelchair Mobility     Tilt Bed    Modified Rankin (Stroke Patients Only)       Balance Overall balance assessment: Mild deficits observed, not formally tested Sitting-balance support: Feet supported, No upper extremity supported Sitting balance-Leahy Scale: Fair Sitting balance - Comments: EOB   Standing balance support: During functional activity, Single extremity supported Standing balance-Leahy Scale: Poor Standing balance comment: CGA to minA when unsupported                            Communication Communication Communication: No apparent difficulties  Cognition Arousal: Alert Behavior During Therapy: WFL for tasks assessed/performed   PT - Cognitive impairments: No family/caregiver present to determine baseline, Safety/Judgement  PT - Cognition Comments: Pt received very slid down/slouched in recliner, and reporting that her purewick was not functioning properly. Pt agreeable to working with PTA, A&O x4 but mildly decreased safety awareness overall. Following commands: Intact      Cueing Cueing Techniques: Verbal cues  Exercises Other Exercises Other Exercises: bridges x3 reps supine Other Exercises: seated chair push-ups x5 reps Other Exercises: standing hip flexion x5 reps (pt requesting to sit after due to wet  briefs)    General Comments General comments (skin integrity, edema, etc.): BP 106/76 (86) supine (fully reclined in chair) HR 70 bpm and SpO2 94% on 2L O2 [place] at rest; no c/o dizziness with postural changes      Pertinent Vitals/Pain Pain Assessment Pain Assessment: Faces Faces Pain Scale: No hurt Pain Intervention(s): Monitored during session, Repositioned    Home Living                          Prior Function            PT Goals (current goals can now be found in the care plan section) Acute Rehab PT Goals PT Goal Formulation: With patient Time For Goal Achievement: 11/06/23 Progress towards PT goals: Progressing toward goals    Frequency    Min 2X/week      PT Plan      Co-evaluation              AM-PAC PT "6 Clicks" Mobility   Outcome Measure  Help needed turning from your back to your side while in a flat bed without using bedrails?: A Little Help needed moving from lying on your back to sitting on the side of a flat bed without using bedrails?: A Little Help needed moving to and from a bed to a chair (including a wheelchair)?: A Little Help needed standing up from a chair using your arms (e.g., wheelchair or bedside chair)?: A Little Help needed to walk in hospital room?: A Lot (limited assessment, has not mobilized much but needed mod safety cues at times) Help needed climbing 3-5 steps with a railing? : Total 6 Click Score: 15    End of Session Equipment Utilized During Treatment: Gait belt;Oxygen  Activity Tolerance: Patient limited by fatigue;Treatment limited secondary to medical complications (Comment);Other (comment) (pt c/o discomfort from wet linens/briefs, RN notified she wants a bath; also new purewick and gown placed) Patient left: in bed;with call bell/phone within reach;with bed alarm set;Other (comment) (pt heels floated for comfort) Nurse Communication: Mobility status;Other (comment) (pt requests a bath) PT Visit Diagnosis:  Unsteadiness on feet (R26.81);Other abnormalities of gait and mobility (R26.89);Muscle weakness (generalized) (M62.81)     Time: 1610-9604 PT Time Calculation (min) (ACUTE ONLY): 22 min  Charges:    $Therapeutic Activity: 8-22 mins PT General Charges $$ ACUTE PT VISIT: 1 Visit                     Dajia Gunnels P., PTA Acute Rehabilitation Services Secure Chat Preferred 9a-5:30pm Office: [phone]    Mariel Shope [facility] 10/25/2023, 6:23 PM

## 2023-10-25 NOTE — Progress Notes (Signed)
 Speech Language Pathology Treatment: Dysphagia  Patient Details Name: Kim Anthony [name]: [id number] DOB: 1950-10-22 Today's Date: 10/25/2023 Time: 4098-1191 SLP Time Calculation (min) (ACUTE ONLY): 11 min  Assessment / Plan / Recommendation Clinical Impression  Pt seen for ongoing dysphagia management.  Pt much improved overall.  She endorses feeling much better.  Today pt independently fed herself regular solid cracker, and puree.  She tolerated thin liquid by straw and requested cup as needed for liquid wash.  Her dentures are not present, for fear of losing them at hospital, but she does not always wear with PO intake.  She is safe to advance diet texture and has no further ST needs. SLP will sign off.  Recommend regular texture diet with thin liquid.     HPI HPI: Kim Anthony is as 72 year old female who presents with worsening renal failure. CXR 4/29: "Mildly worsened bibasilar atelectasis. Small right pleural effusion, slightly  increased. Mild pulmonary edema, slightly worsened. No pneumothorax." Pt with a history of breast cancer, COPD with continued tobacco, O2 dependent at 2 L, right heart failure, and atrial fibrillation on warfarin.      SLP Plan  All goals met      Recommendations for follow up therapy are one component of a multi-disciplinary discharge planning process, led by the attending physician.  Recommendations may be updated based on patient status, additional functional criteria and insurance authorization.    Recommendations  Diet recommendations: Regular;Thin liquid Liquids provided via: Cup;Straw Medication Administration: Whole meds with liquid (as tolerated) Supervision: Patient able to self feed Compensations: Slow rate;Small sips/bites Postural Changes and/or Swallow Maneuvers: Seated upright 90 degrees                  Oral care BID   None Dysphagia, unspecified (R13.10)     All goals met     Elester Grim, MA, CCC-SLP Acute Rehabilitation  Services Office: [phone] 10/25/2023, 10:37 AM

## 2023-10-25 NOTE — TOC Progression Note (Signed)
 Transition of Care [facility]) - Progression Note    Patient Details  Name: Kim Anthony [name]: [id number] Date of Birth: 10-Jul-1950  [facility] CM/SW Contact  Tom-Johnson, Jeydi Klingel Daphne, RN Phone Number: 10/25/2023, 12:35 PM  Clinical Narrative:     Patient is transitioning from Heparin  gtt to Warfarin, Pharmacy following for dosage. Kidney function improving around baseline. Nephrology following.   CM will continue to follow as patient progresses with care towards discharge.        Expected Discharge Plan and Services                                               Social Determinants of Health (SDOH) Interventions SDOH Screenings   Food Insecurity: No Food Insecurity (10/19/2023)  Housing: Low Risk  (10/19/2023)  Transportation Needs: Unmet Transportation Needs (10/19/2023)  Utilities: Not At Risk (10/19/2023)  Alcohol Screen: Low Risk  (12/01/2022)  Depression (PHQ2-9): Low Risk  (10/13/2023)  Financial Resource Strain: Low Risk  (12/01/2022)  Physical Activity: Inactive (12/01/2022)  Social Connections: Socially Isolated (10/19/2023)  Stress: Stress Concern Present (12/01/2022)  Tobacco Use: High Risk (09/29/2023)    Readmission Risk Interventions    10/08/2023    2:23 PM 09/22/2023    2:20 PM 02/19/2022    3:36 PM  Readmission Risk Prevention Plan  Transportation Screening Complete Complete Complete  PCP or Specialist Appt within 3-5 Days   Complete  HRI or Home Care Consult   Complete  Social Work Consult for Recovery Care Planning/Counseling   Complete  Palliative Care Screening   Not Applicable  Medication Review Oceanographer) Complete Complete Complete  PCP or Specialist appointment within 3-5 days of discharge Complete    HRI or Home Care Consult Complete Complete   SW Recovery Care/Counseling Consult Complete    Palliative Care Screening Not Applicable Not Applicable   Skilled Nursing Facility Not Applicable Not Applicable

## 2023-10-25 NOTE — Plan of Care (Signed)
   Problem: Education: Goal: Knowledge of General Education information will improve Description: Including pain rating scale, medication(s)/side effects and non-pharmacologic comfort measures Outcome: Progressing   Problem: Clinical Measurements: Goal: Will remain free from infection Outcome: Progressing Goal: Diagnostic test results will improve Outcome: Progressing

## 2023-10-25 NOTE — Progress Notes (Signed)
  Overnight events: None  Subjective: Doing well this morning.  Has no new concerns.  She would like to go home, we have to wait on removal of her dialysis catheter and stability of her kidney function.  Objective:  Vital signs in last 24 hours: Vitals:   10/25/23 0535 10/25/23 0720 10/25/23 0723 10/25/23 0724  BP: 101/65     Pulse:      Resp: 19     Temp:      TempSrc:      SpO2:  99% 99% 99%  Weight:      Height:       Physical Exam: Constitutional: Chronically ill-appearing but no acute distress, on baseline 2 L nasal cannula Cardio: Regular rate and rhythm, no murmurs, euvolemic; right IJ catheter in place Pulm: Normal respiratory rate and effort, lungs clear to auscultation Skin: No lesions or skin changes Neuro: Alert and oriented x 3, no focal deficits, back at baseline mental status Psych: Normal mood and affect  Labs: Creatinine 1.57, BUN 21 CBC with normocytic anemia, hemoglobin 8.4, no leukocytosis PT 30.7, INR 2.9  Assessment/Plan:  Principal Problem:   Altered mental state Active Problems:   Essential hypertension   COPD (chronic obstructive pulmonary disease) (HCC)   Chronic anticoagulation   Combined systolic and diastolic congestive heart failure (HCC)   Hyperkalemia   Acute on chronic hypoxic respiratory failure (HCC)   Acute kidney injury superimposed on stage 3a chronic kidney disease (HCC)   RVF (right ventricular failure) (HCC)   Uremia   Hyponatremia   AKI (acute kidney injury) (HCC)  Acute kidney injury superimposed on CKD3b Creatinine down to 1.57 today, which is around baseline.  Stable after CRRT.  Nephrology following.  Foley catheter was discontinued today and patient says she was able to void with a PureWick..  She discharges return of reassuring urine output, stable kidney function, and removal of the dialysis catheter. - Nephrology following, appreciate recommendations - Will continue to follow urine output and kidney  function  Encephalopathy, resolved Alert and oriented x 3.  Back at baseline mental status.  Chronic HFpEF Mechanical valve on warfarin Euvolemic on exam.  Bridging back on warfarin with heparin .  Appreciate pharmacy assistance with this.  Patient Summary: Kim Anthony is a 72 year old female with COPD on 2 L nasal cannula, atrial fibrillation, mechanical mitral valve, CKD 3B, admitted for acute kidney injury, uremia and encephalopathy, which is now resolved.  Submitted to the ICU on 4/29 and received CRRT until 5/1.  She was treated with a 5-day course of Zosyn  for sepsis and was transferred to the internal medicine floor on 5/4.  Diet: Dys 3 IVF: None VTE: Warfarin Code: Full   Dispo: Anticipated discharge to  Home pending stability of kidney function and removal of dialysis catheter.   [name], MD 10/25/2023, 12:16 PM Pager: [phone] After 5pm on weekdays and 1pm on weekends: On Call pager [phone]

## 2023-10-25 NOTE — Progress Notes (Signed)
 Calorie Count Note  48 hour calorie count ordered.  Diet: dysphagia 3 Supplements: Ensure BID, Magic Cup TID  Estimated Nutritional Needs:  Kcal:  1600-1800 Protein:  85-100g Fluid:  1L + UOP  No meal tickets to retrieve from calorie count envelope started initiated over the weekend.   Pt in good spirits this morning. Spoke with SLP who reports plan to advance to regular diet textures. Pt states that she has been eating much better.   Called and spoke with her daughter via phone call. She reports that  on Friday evening she drank half an ensure, 100% magic cup and a fruit cup. She brought her some egg drop soup as well which she consumed  75%. On Saturday she reports that she ate 100% of all 3 meals. This morning she was in pain therefore only consumed her cereal for breakfast.   Given improvement in mentation and constant family encouragement, PO intake appears much more improved that on admission. Will continue with current nutrition interventions and monitor for additional need throughout admission.   Nutrition Dx: Increased nutrient needs related to acute illness, catabolic illness as evidenced by estimated needs.   Goal: Patient will meet greater than or equal to 90% of their needs   Intervention:  Continue diet as recommended per SLP Discontinue regular diet Ensure Enlive po BID, each supplement provides 350 kcal and 20 grams of protein. 30 ml ProSource Plus BID, each supplement provides 100 kcals and 15 grams protein.  Magic cup TID with meals, each supplement provides 290 kcal and 9 grams of protein  Allie Bayley Hurn, RDN, LDN Clinical Nutrition See AMiON for contact information.

## 2023-10-25 NOTE — Progress Notes (Signed)
 This chaplain responded to PMT NP-Julia consult for creating/updating the Pt. Advance Directive. The Pt. is awake and celebrating her birthday with family at the bedside.  Family is talking to the Pt. daughter-Kim Anthony on the phone during the chaplain visit. The chaplain understands from [facility] the Pt. has not filled out AD paperwork. Kim Anthony shares she will bring the AD to the Pt. tonight. The chaplain provided education on how to complete an AD in the hospital. Plans are to review the AD with the Pt. on Tuesday and proceed with the notary.  This chaplain is available for F/U spiritual care as needed.  Chaplain Kim Anthony [phone]

## 2023-10-25 NOTE — Telephone Encounter (Signed)
 This is the doctor's office at [facility] not the dialysis center.

## 2023-10-25 NOTE — Progress Notes (Signed)
 Occupational Therapy Treatment Patient Details Name: Kim Anthony [name]: [id number] DOB: April 17, 1951 Today's Date: 10/25/2023   History of present illness 73 y/o F presenting to ED on 4/28 with SOB, neck pain, found to be in septic shock with renal failure, started on CRRT, 4/29-5/2. Recent admit 4/9-4/18 with heart failure exacerbation. PMH includes CHF, COPD, CAD, HLD, HTN, DVT/PE on warfarin, breast CA   OT comments  Pt making slow progression toward goals, today limited by bladder pain and feeling the "urge" to urinate despite having indwelling catheter. Pt supervision-min A for bed mobility, CGA for transfers with straight cane, and set up -CGA for ADLs. Pt able to stand EOB briefly, but then reports continued bladder pain, needing to return to supine. RN notified. Pt presenting with impairments listed below, will follow acutely. Continue to recommend HHOT at d/c.       If plan is discharge home, recommend the following:  A little help with walking and/or transfers;A lot of help with bathing/dressing/bathroom;Assistance with cooking/housework;Direct supervision/assist for medications management;Direct supervision/assist for financial management;Assist for transportation;Help with stairs or ramp for entrance;Supervision due to cognitive status   Equipment Recommendations  None recommended by OT    Recommendations for Other Services PT consult    Precautions / Restrictions Precautions Precautions: Fall Recall of Precautions/Restrictions: Intact Precaution/Restrictions Comments: watch O2 Restrictions Weight Bearing Restrictions Per Provider Order: No       Mobility Bed Mobility Overal bed mobility: Needs Assistance Bed Mobility: Supine to Sit, Sit to Supine     Supine to sit: Supervision Sit to supine: Min assist        Transfers Overall transfer level: Needs assistance Equipment used: Straight cane Transfers: Sit to/from Stand Sit to Stand: Contact guard assist, Min  assist                 Balance Overall balance assessment: Mild deficits observed, not formally tested             Standing balance comment: stands briefly but reports too much bladder pain, needing to sit                           ADL either performed or assessed with clinical judgement   ADL Overall ADL's : Needs assistance/impaired Eating/Feeding: Set up;Sitting   Grooming: Set up;Standing                   Toilet Transfer: Contact guard Marine scientist Details (indicate cue type and reason): with SPC         Functional mobility during ADLs: Contact guard assist;Cane      Extremity/Trunk Assessment Upper Extremity Assessment Upper Extremity Assessment: Generalized weakness   Lower Extremity Assessment Lower Extremity Assessment: Defer to PT evaluation        Vision   Vision Assessment?: No apparent visual deficits   Perception Perception Perception: Not tested   Praxis Praxis Praxis: Not tested   Communication Communication Communication: No apparent difficulties   Cognition Arousal: Alert Behavior During Therapy: WFL for tasks assessed/performed Cognition: Cognition impaired   Orientation impairments: Situation Awareness: Online awareness impaired       OT - Cognition Comments: pt reports bladder feels very full, reports she needs to go to the emergency room for this, reoriented, told pt that she was in the hospital currenlty                 Following commands: Intact  Cueing   Cueing Techniques: Verbal cues  Exercises      Shoulder Instructions       General Comments VSS    Pertinent Vitals/ Pain       Pain Assessment Pain Assessment: Faces Pain Score: 8  Faces Pain Scale: Hurts whole lot Pain Location: bladder Pain Descriptors / Indicators: Discomfort Pain Intervention(s): Limited activity within patient's tolerance, Monitored during session, Repositioned, Other (comment)  (RN notified)  Home Living                                          Prior Functioning/Environment              Frequency  Min 2X/week        Progress Toward Goals  OT Goals(current goals can now be found in the care plan section)  Progress towards OT goals: Progressing toward goals  Acute Rehab OT Goals Patient Stated Goal: to urinate OT Goal Formulation: With patient Time For Goal Achievement: 11/06/23 Potential to Achieve Goals: Good ADL Goals Pt Will Perform Upper Body Dressing: with modified independence;sitting Pt Will Perform Lower Body Dressing: with modified independence;sitting/lateral leans;sit to/from stand Pt Will Transfer to Toilet: with modified independence;ambulating;regular height toilet Pt Will Perform Tub/Shower Transfer: Tub transfer;Shower transfer;with modified independence;ambulating  Plan      Co-evaluation                 AM-PAC OT "6 Clicks" Daily Activity     Outcome Measure   Help from another person eating meals?: None Help from another person taking care of personal grooming?: A Little Help from another person toileting, which includes using toliet, bedpan, or urinal?: A Little Help from another person bathing (including washing, rinsing, drying)?: A Little Help from another person to put on and taking off regular upper body clothing?: A Little Help from another person to put on and taking off regular lower body clothing?: A Little 6 Click Score: 19    End of Session Equipment Utilized During Treatment: Gait belt;Other (comment) (SPC)  OT Visit Diagnosis: Unsteadiness on feet (R26.81);Muscle weakness (generalized) (M62.81)   Activity Tolerance Treatment limited secondary to medical complications (Comment);Patient limited by pain (RN coming in to flush catheter)   Patient Left in bed;with call bell/phone within reach;with bed alarm set;with family/visitor present   Nurse Communication Mobility status         Time: 5366-4403 OT Time Calculation (min): 19 min  Charges: OT General Charges $OT Visit: 1 Visit OT Treatments $Therapeutic Activity: 8-22 mins  Kim Anthony K, OTD, OTR/L SecureChat Preferred Acute Rehab [phone]   Kim Anthony 10/25/2023, 8:42 AM

## 2023-10-26 ENCOUNTER — Other Ambulatory Visit (HOSPITAL_COMMUNITY): Payer: Self-pay

## 2023-10-26 DIAGNOSIS — N179 Acute kidney failure, unspecified: Secondary | ICD-10-CM | POA: Diagnosis not present

## 2023-10-26 DIAGNOSIS — E871 Hypo-osmolality and hyponatremia: Secondary | ICD-10-CM | POA: Diagnosis not present

## 2023-10-26 HISTORY — DX: Hypomagnesemia: E83.42

## 2023-10-26 LAB — RENAL FUNCTION PANEL
Albumin: 2.7 g/dL — ABNORMAL LOW (ref 3.5–5.0)
Anion gap: 9 (ref 5–15)
BUN: 27 mg/dL — ABNORMAL HIGH (ref 8–23)
CO2: 24 mmol/L (ref 22–32)
Calcium: 9 mg/dL (ref 8.9–10.3)
Chloride: 99 mmol/L (ref 98–111)
Creatinine, Ser: 1.46 mg/dL — ABNORMAL HIGH (ref 0.44–1.00)
GFR, Estimated: 38 mL/min — ABNORMAL LOW (ref >60)
Glucose, Bld: 85 mg/dL (ref 70–99)
Phosphorus: 3.6 mg/dL (ref 2.5–4.6)
Potassium: 3.5 mmol/L (ref 3.5–5.1)
Sodium: 132 mmol/L — ABNORMAL LOW (ref 135–145)

## 2023-10-26 LAB — PROTIME-INR
INR: 3.2 — ABNORMAL HIGH (ref 0.8–1.2)
Prothrombin Time: 33.1 s — ABNORMAL HIGH (ref 11.4–15.2)

## 2023-10-26 LAB — CBC
HCT: 28.2 % — ABNORMAL LOW (ref 36.0–46.0)
Hemoglobin: 8.3 g/dL — ABNORMAL LOW (ref 12.0–15.0)
MCH: 26.9 pg (ref 26.0–34.0)
MCHC: 29.4 g/dL — ABNORMAL LOW (ref 30.0–36.0)
MCV: 91.3 fL (ref 80.0–100.0)
Platelets: 305 10*3/uL (ref 150–400)
RBC: 3.09 MIL/uL — ABNORMAL LOW (ref 3.87–5.11)
RDW: 21 % — ABNORMAL HIGH (ref 11.5–15.5)
WBC: 10.8 10*3/uL — ABNORMAL HIGH (ref 4.0–10.5)
nRBC: 0 % (ref 0.0–0.2)

## 2023-10-26 LAB — MAGNESIUM: Magnesium: 1.7 mg/dL (ref 1.7–2.4)

## 2023-10-26 LAB — GLUCOSE, CAPILLARY
Glucose-Capillary: 84 mg/dL (ref 70–99)
Glucose-Capillary: 120 mg/dL — ABNORMAL HIGH (ref 70–99)
Glucose-Capillary: 88 mg/dL (ref 70–99)

## 2023-10-26 LAB — HEPARIN LEVEL (UNFRACTIONATED): Heparin Unfractionated: 0.32 [IU]/mL (ref 0.30–0.70)

## 2023-10-26 MED ORDER — POTASSIUM CHLORIDE 20 MEQ PO PACK: 40.0000 meq | PACK | Freq: Two times a day (BID) | ORAL | Status: DC

## 2023-10-26 MED ORDER — POTASSIUM CHLORIDE CRYS ER 20 MEQ PO TBCR
20.0000 meq | EXTENDED_RELEASE_TABLET | Freq: Two times a day (BID) | ORAL | Status: DC
Start: 2023-10-26 — End: 2023-10-26
  Administered 2023-10-26: 20 meq via ORAL
  Filled 2023-10-26: qty 1

## 2023-10-26 MED ORDER — METOPROLOL TARTRATE 25 MG PO TABS
25.0000 mg | ORAL_TABLET | Freq: Two times a day (BID) | ORAL | 11 refills | Status: DC
  Filled 2023-10-26: qty 60, 30d supply, fill #0

## 2023-10-26 MED ORDER — CHLORHEXIDINE GLUCONATE CLOTH 2 % EX PADS
6.0000 | MEDICATED_PAD | Freq: Every day | CUTANEOUS | Status: DC
  Administered 2023-10-26: 6 via TOPICAL

## 2023-10-26 MED ORDER — MAGNESIUM SULFATE 2 GM/50ML IV SOLN
2.0000 g | Freq: Once | INTRAVENOUS | Status: AC
  Administered 2023-10-26: 2 g via INTRAVENOUS
  Filled 2023-10-26: qty 50

## 2023-10-26 MED ORDER — WARFARIN SODIUM 3 MG PO TABS
3.0000 mg | ORAL_TABLET | Freq: Every day | ORAL | Status: AC
  Administered 2023-10-26: 3 mg via ORAL
  Filled 2023-10-26: qty 1

## 2023-10-26 MED ORDER — WARFARIN SODIUM 3 MG PO TABS
3.0000 mg | ORAL_TABLET | Freq: Every day | ORAL | 0 refills | Status: DC
  Filled 2023-10-26: qty 30, 30d supply, fill #0

## 2023-10-26 MED ORDER — TORSEMIDE 20 MG PO TABS
40.0000 mg | ORAL_TABLET | Freq: Every day | ORAL | 0 refills | Status: DC
  Filled 2023-10-26: qty 60, 30d supply, fill #0

## 2023-10-26 NOTE — Discharge Summary (Signed)
 Name: Kim Anthony [name]: [id number] DOB: [birth year] 73 y.o. PCP: [name], MD  Date of Admission: 10/18/2023  9:18 PM Date of Discharge:  10/26/2023 Attending Physician: [name]  DISCHARGE DIAGNOSIS:  Primary Problem: Altered mental state   Hospital Problems: Principal Problem:   Altered mental state Active Problems:   Essential hypertension   COPD (chronic obstructive pulmonary disease) (HCC)   Chronic anticoagulation   Combined systolic and diastolic congestive heart failure (HCC)   Hyperkalemia   Acute on chronic hypoxic respiratory failure (HCC)   Acute kidney injury superimposed on stage 3a chronic kidney disease (HCC)   RVF (right ventricular failure) (HCC)   Uremia   Hyponatremia   AKI (acute kidney injury) (HCC)   Hypomagnesemia    DISCHARGE MEDICATIONS:   Allergies as of 10/26/2023       Reactions   Lisinopril  Swelling   Acetaminophen -codeine Itching   Propoxyphene Itching   Darvocet   Tape Rash   Plastic   Tramadol  Itching, Nausea And Vomiting, Rash        Medication List     PAUSE taking these medications    allopurinol  100 MG tablet Wait to take this until your doctor or other care provider tells you to start again. Commonly known as: ZYLOPRIM  Take 0.5 tablets (50 mg total) by mouth every other day.   Farxiga  10 MG Tabs tablet Wait to take this until your doctor or other care provider tells you to start again. Generic drug: dapagliflozin  propanediol Take 1 tablet by mouth once daily   losartan  25 MG tablet Wait to take this until your doctor or other care provider tells you to start again. Commonly known as: COZAAR  Take 0.5 tablets (12.5 mg total) by mouth daily.       TAKE these medications    albuterol  108 (90 Base) MCG/ACT inhaler Commonly known as: VENTOLIN  HFA Inhale 1 puff into the lungs every 4 (four) hours as needed for wheezing or shortness of breath.   colchicine  0.6 MG tablet Take 0.5 tablets (0.3 mg total) by  mouth daily.   fluticasone  50 MCG/ACT nasal spray Commonly known as: Flonase  Place 2 sprays into both nostrils daily. What changed: how much to take   gabapentin  100 MG capsule Commonly known as: NEURONTIN  Take 2 capsules (200 mg total) by mouth 3 (three) times daily.   HYDROcodone -acetaminophen  5-325 MG tablet Commonly known as: NORCO/VICODIN Take 1 tablet by mouth every 4 (four) hours as needed for moderate pain (pain score 4-6). Diagnosis- chronic low back pain   hydrOXYzine  10 MG tablet Commonly known as: ATARAX  TAKE 1 TABLET BY MOUTH THREE TIMES DAILY AS NEEDED FOR ANXIETY   metoprolol  succinate 25 MG 24 hr tablet Commonly known as: TOPROL -XL Take 1 tablet (25 mg total) by mouth 2 (two) times daily.   potassium chloride  10 MEQ tablet Commonly known as: KLOR-CON  M Take 2 tablets (20 mEq total) by mouth daily.   Senna-S 8.6-50 MG tablet Generic drug: senna-docusate Take 1 tablet by mouth at bedtime as needed for mild constipation.   simvastatin  20 MG tablet Commonly known as: ZOCOR  Take 1 tablet (20 mg total) by mouth daily.   torsemide  20 MG tablet Commonly known as: DEMADEX  Take 2 tablets (40 mg total) by mouth daily. Start taking on: Oct 27, 2023 What changed: how much to take   Trelegy Ellipta  100-62.5-25 MCG/ACT Aepb Generic drug: Fluticasone -Umeclidin-Vilant Inhale 1 puff into the lungs daily.   Vitamin D  (Ergocalciferol ) 1.25 MG (50000 UNIT)  Caps capsule Commonly known as: DRISDOL  Take 1 capsule (50,000 Units total) by mouth every 7 (seven) days.   warfarin 3 MG tablet Commonly known as: COUMADIN  Take as directed. If you are unsure how to take this medication, talk to your nurse or doctor. Original instructions: Take 1 tablet (3 mg total) by mouth daily at 12 noon. Follow up with Dr. Margit Shelling What changed:  medication strength how much to take when to take this additional instructions       DISPOSITION AND FOLLOW-UP:  [name] was  discharged from [facility] in Stable condition. At the hospital follow up visit please address:  Acute kidney injury, superimposed on CKD stage IIIb: Thought to be secondary to sepsis and/or cardiorenal syndrome.  Treated with CRRT, now back at baseline creatinine at discharge.  Recommend rechecking a BMP at follow-up. Farxiga  and losartan  were held in the setting of her acute kidney injury.  Consider restarting if creatinine is stable.  Warfarin management: On warfarin given her prosthetic mitral valve.  Previously on 5 mg p.o., but was supratherapeutic.  With pharmacy assistance, titrated down to 3 mg to achieve therapeutic INR levels.  Has a follow-up appointment in the Coumadin  clinic on 5/8.  Ensure she attends this and also monitors her INR at home with her machine (Daughter updated, who helps with this). Allopurinol  held for concerns of possible interactions with warfarin.   Combined systolic and diastolic CHF: Torsemide  decreased to 40 mg daily.  Please assess volume status at follow-up visit. Euvolemic on discharge.   Follow-up Appointments:  Follow-up Information     Jackolyn Masker, MD. Go on 10/28/2023.   Specialty: Internal Medicine Why: Please go to your hospital follow-up appointment on 10/28/2023 at 8:45 AM.  You will have an appointment with Dr. Hershell Lose in the Coumadin  clinic immediately after at 9:30 AM on the same date. Contact information: [address] [place] [address] [phone]         [facility] Follow up.   Specialty: Home Health Services Why: Someone will call you to schedule resumption of care visit. Contact information: [address] [place] [address] [phone]                 HOSPITAL COURSE:  Patient Summary: Altered mental status Acute kidney injury, superimposed on CKD3b She presented with altered mental status and was alert only to self.  She was subsequently found to be uremic with a BUN of  104 and a creatinine acutely elevated to 4.04 from a baseline of 1.85.  There were several possible etiologies for this including sepsis and cardiorenal syndrome.  She was admitted to the ICU for CRRT, which she responded well to.  Her creatinine returned down to her baseline and her mental status improved.  She was transferred to the internal medicine service on 5/4.  Her creatinine remained stable and her urine output returned to normal, so her dialysis catheter was removed and she was deemed stable for discharge.  Farxiga  and losartan  were held in the setting of her acute kidney injury and may be restarted outpatient.  Suspected sepsis She presented with leukocytosis and bilateral parotid gland enlargement and tenderness (parotid U/S on 4/11 negative), concerning for sialadenitis.  She was treated for presumed sepsis with 5 days of Zosyn .  Her leukocytosis and parotid enlargement have resolved.  She remained afebrile and blood cultures were negative.  Atrial fibrillation, on warfarin Mechanical mitral valve PTT 33.4, INR  3.3 on presentation.  Initially on 5 mg of warfarin daily, but this has been titrated during her hospital stay by the pharmacy team to reach stable therapeutic levels.  She is being discharged on 3 mg daily and has close follow-up with with Dr. Margit Shelling in the IMTS clinic.   Combined systolic and diastolic congestive heart failure Mild to moderate pulmonary hypertension with cor pulmonale Known history of heart failure, but presented mostly euvolemic on exam.  BNP was elevated at 911.  She has been previously admitted for HF exacerbations due to poor medication adherence and dietary indiscretion. Last echocardiogram in March 2025 showed an ejection fraction of 55-60% with severely reduced right ventricular function and right-sided volume overload.  Right heart cath around same time showed pulmonary arterial hypertension with cor pulmonale.  Medication regimen includes Farxiga  10 mg daily,  metoprolol  25 mg twice daily, losartan  12.5 mg daily, and torsemide  80 mg daily.  Farxiga  and losartan  were held in the setting of acute kidney injury.  Torsemide  was decreased to 40 mg daily.   Hyperkalemia Potassium elevated at 5.8 in the setting of acute kidney injury.  Received calcium  gluconate and calcium  gluconate in the ED.  She takes 20 mEq of potassium every day.  EKG without electrolyte changes.  Potassium improved following resolution of acute kidney injury.   Gout No acute issues.  Recently prescribed allopurinol  50 mg every other day outpatient, but has not taken it. Holding this as it may interact with warfarin.    Hypertension Normotensive for several days prior to discharge.  Losartan  held in the setting of AKI.  No acute concerns.  Outpatient management.   Hyperlipidemia On simvastatin  20 mg daily, held during hospitalization.    COPD No concern for exacerbation. On albuterol , breztri  at home.     DISCHARGE INSTRUCTIONS:   Discharge Instructions     Call MD for:  difficulty breathing, headache or visual disturbances   Complete by: As directed    Call MD for:  extreme fatigue   Complete by: As directed    Call MD for:  persistant dizziness or light-headedness   Complete by: As directed    Call MD for:  persistant nausea and vomiting   Complete by: As directed    Call MD for:  redness, tenderness, or signs of infection (pain, swelling, redness, odor or green/yellow discharge around incision site)   Complete by: As directed    Call MD for:  severe uncontrolled pain   Complete by: As directed    Call MD for:  temperature >100.4   Complete by: As directed    Diet - low sodium heart healthy   Complete by: As directed    Discharge instructions   Complete by: As directed    You were hospitalized for acute kidney injury and altered mental status.  You were treated with dialysis and are now back to baseline.  We are glad you are feeling better.  Thank you for allowing  us  to be part of your care.   We arranged for you to follow up at: [facility] internal medicine clinic on this Thursday, 10/28/2023 at 8:45 AM with Dr. Meredeth Stallion. You will also see Dr. Margit Shelling during that visit to follow up on your Warfarin and INR levels.    We have placed an order for home health physical therapy and occupational therapy, who should contact you about scheduling sessions.  Please note these changes made to your medications:   We changed the dose of  your torsemide  to 40 mg.  Please take 2 tablets (40 mg total) by mouth daily.  This medication helps your body get rid of excess fluid.  We changed your warfarin to 3 mg tablets.  Please take 1 of these tablets by mouth daily at noon.  Is very important that you follow-up with Dr. Groce about this medication.   Please STOP taking:   Allopurinol  Farxiga  Losartan   Your outpatient doctors can work with you about whether to restart these medications.  We stopped them due to your kidney injury possible interactions they have with warfarin.  Please make sure to check your INR regularly and follow-up at your appointment later this week.  If you notice any increased confusion, heavy bleeding, increased swelling, or decreased urine production, please return to the emergency department for further evaluation.  Otherwise, please follow-up with your outpatient primary care doctor.  Please call our clinic if you have any questions or concerns, we may be able to help and keep you from a long and expensive emergency room wait. Our clinic and after hours phone number is [phone], the best time to call is Monday through Friday 9 am to 4 pm but there is always someone available 24/7 if you have an emergency. If you need medication refills please notify your pharmacy one week in advance and they will send us  a request.   Face-to-face encounter (required for Medicare/Medicaid patients)   Complete by: As directed    I [name] certify that this  patient is under my care and that I, or a nurse practitioner or physician's assistant working with me, had a face-to-face encounter that meets the physician face-to-face encounter requirements with this patient on 10/26/2023. The encounter with the patient was in whole, or in part for the following medical condition(s) which is the primary reason for home health care (List medical condition): Deconditioning   The encounter with the patient was in whole, or in part, for the following medical condition, which is the primary reason for home health care: Deconditioning   I certify that, based on my findings, the following services are medically necessary home health services: Physical therapy   Reason for Medically Necessary Home Health Services: Therapy- Therapeutic Exercises to Increase Strength and Endurance   My clinical findings support the need for the above services: Unsafe ambulation due to balance issues   Further, I certify that my clinical findings support that this patient is homebound due to: Unsafe ambulation due to balance issues   Home Health   Complete by: As directed    To provide the following care/treatments:  PT OT     Increase activity slowly   Complete by: As directed        SUBJECTIVE:   Doing well this morning, feels back to her baseline.  Had good urine output yesterday.  No acute concerns.  Ready for discharge.  Discharge Vitals:   BP 112/77 (BP Location: Right Arm)   Pulse 68   Temp (!) 97.5 F (36.4 C) (Oral)   Resp (!) 24   Ht 5\' 5"  (1.651 m)   Wt 76 kg   SpO2 (!) 89%   BMI 27.88 kg/m   OBJECTIVE:  Physical Exam HENT:     Head: Normocephalic and atraumatic.  Eyes:     Extraocular Movements: Extraocular movements intact.     Pupils: Pupils are equal, round, and reactive to light.  Cardiovascular:     Rate and Rhythm: Normal rate and regular rhythm.  Comments: Right IJ catheter in place Pulmonary:     Effort: Pulmonary effort is normal.     Breath  sounds: Normal breath sounds.  Musculoskeletal:        General: Normal range of motion.     Cervical back: Normal range of motion.  Skin:    General: Skin is warm and dry.  Neurological:     General: No focal deficit present.     Mental Status: She is alert and oriented to person, place, and time.  Psychiatric:        Mood and Affect: Mood normal.        Behavior: Behavior normal.     Pertinent Labs, Studies, and Procedures:     Latest Ref Rng & Units 10/26/2023    6:28 AM 10/25/2023    4:21 AM 10/24/2023    4:52 AM  CBC  WBC 4.0 - 10.5 K/uL 10.8  9.7  11.4   Hemoglobin 12.0 - 15.0 g/dL 8.3  8.4  8.8   Hematocrit 36.0 - 46.0 % 28.2  28.2  29.0   Platelets 150 - 400 K/uL 305  295  296        Latest Ref Rng & Units 10/26/2023    6:28 AM 10/25/2023    3:46 PM 10/25/2023    4:21 AM  CMP  Glucose 70 - 99 mg/dL 85  161  096   BUN 8 - 23 mg/dL 27  25  21    Creatinine 0.44 - 1.00 mg/dL 0.45  4.09  8.11   Sodium 135 - 145 mmol/L 132  134  134   Potassium 3.5 - 5.1 mmol/L 3.5  3.4  3.6   Chloride 98 - 111 mmol/L 99  101  102   CO2 22 - 32 mmol/L 24  21  24    Calcium  8.9 - 10.3 mg/dL 9.0  9.2  9.0     DG CHEST PORT 1 VIEW Result Date: 10/19/2023 IMPRESSION: 1. Right internal jugular central venous catheter terminates in the lower third of the SVC. No pneumothorax. 2. Moderate cardiomegaly, stable. Mild pumonary edema, slightly worsened. 3. Small right pleural effusion, slightly increased. 4. Mildly worsened bibasilar atelectasis. Electronically signed by: Karlyn Overman MD 10/19/2023 02:35 PM EDT RP Workstation: [id number]   US  RENAL Result Date: 10/19/2023 IMPRESSION: Mild increased echogenicity of the renal cortex without significant thinning suggestive of chronic medical renal disease. Electronically Signed   By: Elester Grim M.D.   On: 10/19/2023 09:59   DG Chest 2 View Result Date: 10/18/2023 IMPRESSION: 1. No evidence of acute chest disease. 2. Cardiomegaly with chronic  pleural-parenchymal disease left apex, left mid field. Electronically Signed   By: Denman Fischer M.D.   On: 10/18/2023 22:17    Signed: [name], MD Internal Medicine Resident, PGY-1 [facility]  Pager: [phone] 11:47 AM, 10/26/2023

## 2023-10-26 NOTE — Progress Notes (Signed)
 PHARMACY - ANTICOAGULATION CONSULT NOTE  Pharmacy Consult for Heparin  and Warfarin  Indication: atrial fibrillation with mechanical mitral valve   Allergies  Allergen Reactions   Lisinopril  Swelling   Acetaminophen -Codeine Itching   Propoxyphene Itching    Darvocet   Tape Rash    Plastic   Tramadol  Itching, Nausea And Vomiting and Rash    Patient Measurements: Height: 5\' 5"  (165.1 cm) Weight: 76 kg (167 lb 8.8 oz) IBW/kg (Calculated) : 57 HEPARIN  DW (KG): 72.6  Vital Signs: Temp: 97.5 F (36.4 C) (05/06 0716) Temp Source: Oral (05/06 0716) BP: 111/70 (05/06 0934) Pulse Rate: 73 (05/06 0934)  Labs: Recent Labs    10/24/23 0452 10/24/23 1655 10/25/23 0421 10/25/23 1546 10/26/23 0628  HGB 8.8*  --  8.4*  --  8.3*  HCT 29.0*  --  28.2*  --  28.2*  PLT 296  --  295  --  305  LABPROT 25.7*  --  30.7*  --  33.1*  INR 2.3*  --  2.9*  --  3.2*  HEPARINUNFRC 0.39  --  0.45  --  0.32  CREATININE 1.60*   < > 1.57* 1.51* 1.46*   < > = values in this interval not displayed.    Estimated Creatinine Clearance: 34.5 mL/min (A) (by C-G formula based on SCr of 1.46 mg/dL (H)).   Assessment: Kim Anthony is a 73 y.o. year old female presented on 10/18/2023 with concern for AMS. On warfarin prior to admission for MVR and atrial fibrillation. Prior to admission regimen, 5 mg PO daily (total weekly dose 35 mg). Last dose prior to admission 10/17/23 PM. Pharmacy consulted for heparin  / warfarin management.   5/6: INR today is therapeutic for the second consecutive check, currently at 3.2, following four warfarin doses (2 mg, 4 mg, 3 mg, and 2.5 mg). Documented oral intake has been relatively low (10-15%), which explains the use of lower doses compared to the patient's PTA regimen of 5 mg and likely contributed to the observed INR increase. Given the expected INR response after a few doses, a 3 mg dose will be given today and continued upon discharge. The patient reports having an INR  monitoring device at home and plans to check her INR again in 2 days (5/8), and will follow up with outpatient warfarin clinic. As the INR is now therapeutic after two doses, the heparin  bridge will be discontinued. CBC remains stable: Hgb 8.3, PLT 305.   Goal of Therapy:  Heparin  level 0.3-0.7 units/ml INR 2.5-3.5    Monitor platelets by anticoagulation protocol: Yes   Plan:  Discontinue heparin  infusion  Coumadin  3 mg PO today and continue dose at home Follow up with outpatient warfarin clinic on 5/8  Dominyck Reser, PharmD Candidate  10/26/2023 10:27 AM

## 2023-10-26 NOTE — TOC Transition Note (Signed)
 Transition of Care [facility]) - Discharge Note   Patient Details  Name: Kim Anthony [name]: [id number] Date of Birth: 16-Feb-1951  Transition of Care [facility]) CM/SW Contact:  Tom-Johnson, Daine Gunther Daphne, RN Phone Number: 10/26/2023, 11:18 AM   Clinical Narrative:     Patient is scheduled for discharge today.  Readmission Risk Assessment done. Home health resumption of care info, Outpatient f/u, hospital f/u and discharge instructions on AVS. Prescriptions sent to [facility] pharmacy and patient will receive meds prior discharge. Daughter, [name] to transport at discharge.  No further TOC needs noted.       Final next level of care: Home w Home Health Services (Resumption of care) Barriers to Discharge: Barriers Resolved   Patient Goals and CMS Choice Patient states their goals for this hospitalization and ongoing recovery are:: To return home with daughter CMS Medicare.gov Compare Post Acute Care list provided to:: Patient   [place] ownership interest in [facility].provided to:: Adult Children (Daughter, [facility])    Discharge Placement                Patient to be transferred to facility by: Daughter Name of family member notified: [facility]    Discharge Plan and Services Additional resources added to the After Visit Summary for                  DME Arranged: N/A DME Agency: NA       HH Arranged: OT, RN (Resumption of care) HH Agency: [facility] Date [facility] Agency Contacted: 10/26/23 Time HH Agency Contacted: 1110 Representative spoke with at [facility] Agency: Loetta Ringer  Social Drivers of Health (SDOH) Interventions SDOH Screenings   Food Insecurity: No Food Insecurity (10/19/2023)  Housing: Low Risk  (10/19/2023)  Transportation Needs: Unmet Transportation Needs (10/19/2023)  Utilities: Not At Risk (10/19/2023)  Alcohol Screen: Low Risk  (12/01/2022)  Depression (PHQ2-9): Low Risk  (10/13/2023)  Financial Resource Strain: Low Risk  (12/01/2022)  Physical Activity:  Inactive (12/01/2022)  Social Connections: Socially Isolated (10/19/2023)  Stress: Stress Concern Present (12/01/2022)  Tobacco Use: High Risk (10/25/2023)     Readmission Risk Interventions    10/26/2023   11:15 AM 10/08/2023    2:23 PM 09/22/2023    2:20 PM  Readmission Risk Prevention Plan  Transportation Screening Complete Complete Complete  Medication Review (RN Care Manager) Referral to Pharmacy Complete Complete  PCP or Specialist appointment within 3-5 days of discharge Complete Complete   HRI or Home Care Consult Complete Complete Complete  SW Recovery Care/Counseling Consult Complete Complete   Palliative Care Screening Not Applicable Not Applicable Not Applicable  Skilled Nursing Facility Not Applicable Not Applicable Not Applicable

## 2023-10-26 NOTE — Progress Notes (Signed)
 This chaplain is following up on the creating of the Pt. Advance Directive.   The Pt. daughter-Monica left the filled out AD with the Pt. The chaplain reviewed Advance Directive: HCPOA and LW education with the Pt. At the time of the visit, the Pt. is unable to answer the chaplain's clarifying questions after education. The Pt. shares she is "tired" and agrees with the chaplain phoning [name] to discuss the next steps.  The chaplain phoned [name] and offered AD education on the importance of the Pt. having knowledge of the legal document we are asking her to sign. [name] accepted the possibilities of completing the AD outside the hospital. The unsigned document was returned to the Pt. along with a blank copy. The chaplain circled back with the Pt. and affirmed Monica's acceptance of waiting with the Pt.  This chaplain is available for F/U spiritual care as needed.  Chaplain Kathleene Papas [phone]

## 2023-10-26 NOTE — Progress Notes (Signed)
 Patient ID: Kim Anthony, female   DOB: [birth year], 73 y.o.   [name]: [id number] [facility] Progress Note   Assessment/ Plan:   1. Acute kidney Injury on chronic kidney disease stage IIIb: Recent hospitalization for severe right-sided heart failure/pulmonary hypertension treated with diuresis and discharged with creatinine higher than previous baseline.  Readmitted with acute kidney injury that is suspected to be hemodynamically mediated.  Renal function has been essentially stable overnight with improvement of urine output following restart of torsemide .  Torsemide  was started at a lower dose of 40 mg daily (compared to 80 mg daily that she was taking preadmission) with the intention to ramp up the dose over time. 2.  Sepsis/encephalopathy: Unclear etiology/source and status post completion of 5 days of Zosyn . 3.  Chronic diastolic heart failure with pulmonary hypertension: Holding ARB in the setting of acute kidney injury/relative hypotension.  He appears to be clinically compensated and has been started back on torsemide . 4.  Hyponatremia: Continue to limit free water and monitor on torsemide  40 mg daily. 5.  Hypokalemia: Mild, will replace via oral potassium.  Subjective:   Reports to be feeling better and denies any acute problems overnight.   Objective:   BP 112/86 (BP Location: Right Arm)   Pulse 72   Temp (!) 97.5 F (36.4 C) (Oral)   Resp 20   Ht 5\' 5"  (1.651 m)   Wt 76 kg   SpO2 94%   BMI 27.88 kg/m   Intake/Output Summary (Last 24 hours) at 10/26/2023 0733 Last data filed at 10/26/2023 0600 Gross per 24 hour  Intake 915.33 ml  Output 1500 ml  Net -584.67 ml   Weight change:   Physical Exam: Gen: Resting comfortably in bed, nurse technician at bedside checking blood sugar CVS: Pulse regular rhythm, normal rate, S1 and S2 normal.  Right IJ temporary dialysis catheter in place Resp: Diminished breath sounds over bases, poor inspiratory effort.  Rales/rhonchi Abd:  Soft, flat, nontender, bowel sounds normal Ext: No lower extremity edema.  Imaging: No results found.  Labs: BMET Recent Labs  Lab 10/22/23 1801 10/23/23 0256 10/23/23 1821 10/24/23 0452 10/24/23 1655 10/25/23 0421 10/25/23 1546  NA 137 140 136 137 134* 134* 134*  K 3.2* 3.0* 3.6 3.5 4.0 3.6 3.4*  CL 104 105 102 102 104 102 101  CO2 21* 25 23 21* 19* 24 21*  GLUCOSE 92 103* 87 84 107* 102* 116*  BUN 15 16 18 20 20 21  25*  CREATININE 1.31* 1.44* 1.70* 1.60* 1.50* 1.57* 1.51*  CALCIUM  9.4 9.1 9.0 9.3 9.1 9.0 9.2  PHOS 3.9 4.3 4.2 3.6 3.4 3.6 3.4   CBC Recent Labs  Lab 10/23/23 0256 10/24/23 0452 10/25/23 0421 10/26/23 0628  WBC 11.2* 11.4* 9.7 10.8*  HGB 9.1* 8.8* 8.4* 8.3*  HCT 29.9* 29.0* 28.2* 28.2*  MCV 89.0 90.1 91.9 91.3  PLT 327 296 295 305    Medications:     (feeding supplement) PROSource Plus  30 mL Oral TID BM   arformoterol   15 mcg Nebulization BID   budesonide  (PULMICORT ) nebulizer solution  0.5 mg Nebulization BID   Chlorhexidine  Gluconate Cloth  6 each Topical Q0600   famotidine   20 mg Oral Daily   feeding supplement  237 mL Oral BID BM   gabapentin   200 mg Oral TID   metoprolol  tartrate  25 mg Oral BID   multivitamin  1 tablet Oral QHS   revefenacin   175 mcg Nebulization Daily   torsemide   40 mg Oral Daily   Warfarin - Pharmacist Dosing Inpatient   Does not apply q1600    Clevester Dally, MD 10/26/2023, 7:33 AM

## 2023-10-27 ENCOUNTER — Telehealth: Payer: Self-pay

## 2023-10-27 ENCOUNTER — Telehealth: Payer: Self-pay | Admitting: *Deleted

## 2023-10-27 MED ORDER — HYDROCODONE-ACETAMINOPHEN 5-325 MG PO TABS: 1.0000 | ORAL_TABLET | ORAL | 0 refills | Status: DC | PRN

## 2023-10-27 NOTE — Progress Notes (Unsigned)
 CC: Hospital follow up  HPI:  [name] is a 73 y.o. with medical history of HTN, HLD, DMII, chronic hypoxic respiratory failure 2/2 to COPD, gout, and Mechanical valve replacement presenting to [facility] for hospital follow up. She was admitted from 04/29-05/06 for acute encephalopathy. She was uremic and received CRRT. Also there was concern for parotid gland infection and she received 5 days of Zosyn . Of note patient had 5 admission in 2025.   Please see problem-based list for further details, assessments, and plans.  Past Medical History:  Diagnosis Date   A-fib (HCC) 03/13/2022   Acute delirium 08/30/2023   Acute GI bleeding 08/18/2023   Acute idiopathic gout of multiple sites 09/30/2020   Acute on chronic hypoxic respiratory failure (HCC) 07/30/2023   Arthritis    Asthma    Breast cancer (HCC) 2001   Left Breast Cancer   CHF (congestive heart failure) (HCC)    Closed fracture of right hip (HCC) 09/23/2022   Closed intertrochanteric fracture of right femur, initial encounter (HCC) 09/22/2022   COPD (chronic obstructive pulmonary disease) (HCC)    Coronary artery disease    DVT (deep venous thrombosis) (HCC) 09/02/2023   GERD (gastroesophageal reflux disease)    Headache, common migraine 07/11/2015   History of mitral valve prosthesis 07/31/2023   History of mitral valve replacement with mechanical valve    HLD (hyperlipidemia)    Hyperkalemia 10/08/2022   Hypertension    Hypomagnesemia 10/26/2023   Monoarthritis of wrist 10/18/2019   Osteoarthritis of left knee 04/24/2021   Pain around eye, bilateral 10/03/2023   Pain in right hand 01/16/2021   Personal history of breast cancer    Left Breast Cancer     Postmenopausal bleeding 04/21/2018   Endometrial biopsy negative 2019 no further workup advised at that time.     Pulmonary embolism (HCC) 2021   Pulmonary hypertension, low resistance (HCC) 08/20/2023   QT prolongation 08/18/2023   RVF (right ventricular failure)  (HCC) 07/31/2023   Seronegative inflammatory arthritis 02/17/2021   Vaginal bleeding 04/01/2022   Wrist arthritis 07/23/2023    Current Outpatient Medications (Endocrine & Metabolic):    [Paused] FARXIGA  10 MG TABS tablet, Take 1 tablet by mouth once daily  Current Outpatient Medications (Cardiovascular):    [Paused] losartan  (COZAAR ) 25 MG tablet, Take 0.5 tablets (12.5 mg total) by mouth daily.   metoprolol  tartrate (LOPRESSOR ) 25 MG tablet, Take 1 tablet (25 mg total) by mouth 2 (two) times daily.   simvastatin  (ZOCOR ) 20 MG tablet, Take 1 tablet (20 mg total) by mouth daily.   torsemide  (DEMADEX ) 20 MG tablet, Take 2 tablets (40 mg total) by mouth daily.  Current Outpatient Medications (Respiratory):    albuterol  (VENTOLIN  HFA) 108 (90 Base) MCG/ACT inhaler, Inhale 1 puff into the lungs every 4 (four) hours as needed for wheezing or shortness of breath.   fluticasone  (FLONASE ) 50 MCG/ACT nasal spray, Place 2 sprays into both nostrils daily. (Patient taking differently: Place 1 spray into both nostrils daily.)   Fluticasone -Umeclidin-Vilant (TRELEGY ELLIPTA ) 100-62.5-25 MCG/ACT AEPB, Inhale 1 puff into the lungs daily.  Current Outpatient Medications (Analgesics):    [Paused] allopurinol  (ZYLOPRIM ) 100 MG tablet, Take 0.5 tablets (50 mg total) by mouth every other day. (Patient not taking: Reported on 10/19/2023)   colchicine  0.6 MG tablet, Take 0.5 tablets (0.3 mg total) by mouth daily.   HYDROcodone -acetaminophen  (NORCO/VICODIN) 5-325 MG tablet, Take 1 tablet by mouth every 4 (four) hours as needed for moderate pain (pain  score 4-6). Diagnosis- chronic low back pain  Current Outpatient Medications (Hematological):    warfarin (COUMADIN ) 3 MG tablet, Take 1 tablet (3 mg total) by mouth daily at 12 noon. Follow up with Dr. Margit Shelling  Current Outpatient Medications (Other):    gabapentin  (NEURONTIN ) 100 MG capsule, Take 2 capsules (200 mg total) by mouth 3 (three) times daily.   potassium  chloride (KLOR-CON  M) 10 MEQ tablet, Take 2 tablets (20 mEq total) by mouth daily.   senna-docusate (SENOKOT-S) 8.6-50 MG tablet, Take 1 tablet by mouth at bedtime as needed for mild constipation.   Vitamin D , Ergocalciferol , (DRISDOL ) 1.25 MG (50000 UNIT) CAPS capsule, Take 1 capsule (50,000 Units total) by mouth every 7 (seven) days.  Review of Systems:  Review of system negative unless stated in the problem list or HPI.    Physical Exam:  Vitals:   10/28/23 0927 10/28/23 1027 10/28/23 1032  BP: 91/66  (!) 108/55  Pulse: 61  68  Temp: 97.7 F (36.5 C)    TempSrc: Oral    SpO2:  91%   Weight: 155 lb 3.2 oz (70.4 kg)    Height: 1' (0.305 m)     Physical Exam General: NAD HENT: NCAT Lungs: CTAB, no wheeze, rhonchi or rales.  Cardiovascular: Normal heart sounds, no r/m/g, 2+ pulses in all extremities. No LE edema Abdomen: No TTP, normal bowel sounds MSK: No asymmetry or muscle atrophy.  Skin: no lesions noted on exposed skin Neuro: Alert and oriented x4. CN grossly intact Psych: Normal mood and normal affect  Assessment & Plan:   Chronic anticoagulation INR was elevated at 6.3. Suspect this is secondary to AKI on CKD. CBC was checked that did not show any signs of blood loss. Per Dr. Margit Shelling, hold warfarin until Saturday and repeat INR at that time (see note).   Acute kidney injury superimposed on stage 3b chronic kidney disease [facility]) Patient recently admitted for AKI that required CRRT due to shock. During hospitalization follow up, lab work shows AKI on CKDIIIa. Given her complicated hx, will admit her for gentle IVF. She appears hypovolemic to euvolemic on exam. Initially plan was to hold the diuretic and repeat blood work on Monday but this might not be safe option given her cardiac hx.   Essential hypertension Blood pressure borderline low. Will hold restarting farxiga  and losartan  until AKI resolves and blood pressure.  Failure to thrive  in adult Pt with 5 admission since  start of this year. She had multiple admissions that required milrinone . Did initiate some GOC during our hospital follow up but pt and daughter maybe more receptive if multidisciplinary approach was used. Would continue to discuss this with patient at follow up visit and involve palliative care as well.    See Encounters Tab for problem based charting.  Patient Discussed with Dr. Blythe Sanders, MD Tommas Fragmin. [facility] Internal Medicine Residency, PGY-3

## 2023-10-27 NOTE — Transitions of Care (Post Inpatient/ED Visit) (Signed)
   10/27/2023  Name: SADHANA CINTORA [name]: [id number] DOB: November 27, 1950  Today's TOC FU Call Status: Today's TOC FU Call Status:: Unsuccessful Call (1st Attempt) Unsuccessful Call (1st Attempt) Date: 10/27/23  Attempted to reach the patient regarding the most recent Inpatient/ED visit.  Follow Up Plan: Additional outreach attempts will be made to reach the patient to complete the Transitions of Care (Post Inpatient/ED visit) call.   Tonia Frankel RN, CCM [place]  VBCI-Population Health RN Care Manager [phone]

## 2023-10-27 NOTE — Telephone Encounter (Signed)
 Emil called for a refill on her hydrocodone . Last fill date was 09/30/23 per PMP.

## 2023-10-28 ENCOUNTER — Other Ambulatory Visit: Payer: Self-pay

## 2023-10-28 ENCOUNTER — Ambulatory Visit: Payer: Self-pay

## 2023-10-28 ENCOUNTER — Ambulatory Visit

## 2023-10-28 ENCOUNTER — Encounter (HOSPITAL_COMMUNITY): Payer: Self-pay | Admitting: Emergency Medicine

## 2023-10-28 ENCOUNTER — Ambulatory Visit (INDEPENDENT_AMBULATORY_CARE_PROVIDER_SITE_OTHER): Admitting: Internal Medicine

## 2023-10-28 ENCOUNTER — Emergency Department (HOSPITAL_COMMUNITY)

## 2023-10-28 ENCOUNTER — Encounter (HOSPITAL_COMMUNITY): Payer: Self-pay

## 2023-10-28 ENCOUNTER — Inpatient Hospital Stay (HOSPITAL_COMMUNITY): Admit: 2023-10-28

## 2023-10-28 ENCOUNTER — Telehealth: Payer: Self-pay

## 2023-10-28 ENCOUNTER — Telehealth: Payer: Self-pay | Admitting: *Deleted

## 2023-10-28 ENCOUNTER — Inpatient Hospital Stay (HOSPITAL_COMMUNITY)
Admission: EM | Admit: 2023-10-28 | Discharge: 2023-11-21 | DRG: 291 | Disposition: E | Attending: Cardiology | Admitting: Cardiology

## 2023-10-28 ENCOUNTER — Encounter: Payer: Self-pay | Admitting: Internal Medicine

## 2023-10-28 VITALS — BP 108/55 | HR 68 | Temp 97.7°F | Ht <= 58 in | Wt 155.2 lb

## 2023-10-28 DIAGNOSIS — N179 Acute kidney failure, unspecified: Secondary | ICD-10-CM | POA: Diagnosis present

## 2023-10-28 DIAGNOSIS — E11649 Type 2 diabetes mellitus with hypoglycemia without coma: Secondary | ICD-10-CM | POA: Diagnosis present

## 2023-10-28 DIAGNOSIS — E8729 Other acidosis: Secondary | ICD-10-CM | POA: Insufficient documentation

## 2023-10-28 DIAGNOSIS — Z515 Encounter for palliative care: Secondary | ICD-10-CM

## 2023-10-28 DIAGNOSIS — K921 Melena: Secondary | ICD-10-CM | POA: Diagnosis not present

## 2023-10-28 DIAGNOSIS — J9621 Acute and chronic respiratory failure with hypoxia: Secondary | ICD-10-CM | POA: Diagnosis present

## 2023-10-28 DIAGNOSIS — I2699 Other pulmonary embolism without acute cor pulmonale: Secondary | ICD-10-CM

## 2023-10-28 DIAGNOSIS — M1000 Idiopathic gout, unspecified site: Secondary | ICD-10-CM | POA: Diagnosis present

## 2023-10-28 DIAGNOSIS — I071 Rheumatic tricuspid insufficiency: Secondary | ICD-10-CM | POA: Diagnosis present

## 2023-10-28 DIAGNOSIS — Z952 Presence of prosthetic heart valve: Secondary | ICD-10-CM

## 2023-10-28 DIAGNOSIS — Z888 Allergy status to other drugs, medicaments and biological substances status: Secondary | ICD-10-CM

## 2023-10-28 DIAGNOSIS — I10 Essential (primary) hypertension: Secondary | ICD-10-CM | POA: Diagnosis present

## 2023-10-28 DIAGNOSIS — E1122 Type 2 diabetes mellitus with diabetic chronic kidney disease: Secondary | ICD-10-CM | POA: Diagnosis present

## 2023-10-28 DIAGNOSIS — I129 Hypertensive chronic kidney disease with stage 1 through stage 4 chronic kidney disease, or unspecified chronic kidney disease: Secondary | ICD-10-CM | POA: Diagnosis not present

## 2023-10-28 DIAGNOSIS — J189 Pneumonia, unspecified organism: Secondary | ICD-10-CM | POA: Diagnosis present

## 2023-10-28 DIAGNOSIS — D631 Anemia in chronic kidney disease: Secondary | ICD-10-CM | POA: Diagnosis present

## 2023-10-28 DIAGNOSIS — E871 Hypo-osmolality and hyponatremia: Secondary | ICD-10-CM | POA: Diagnosis present

## 2023-10-28 DIAGNOSIS — I5040 Unspecified combined systolic (congestive) and diastolic (congestive) heart failure: Secondary | ICD-10-CM | POA: Diagnosis present

## 2023-10-28 DIAGNOSIS — R791 Abnormal coagulation profile: Secondary | ICD-10-CM

## 2023-10-28 DIAGNOSIS — I5033 Acute on chronic diastolic (congestive) heart failure: Secondary | ICD-10-CM | POA: Diagnosis not present

## 2023-10-28 DIAGNOSIS — Z7951 Long term (current) use of inhaled steroids: Secondary | ICD-10-CM | POA: Diagnosis not present

## 2023-10-28 DIAGNOSIS — I50813 Acute on chronic right heart failure: Secondary | ICD-10-CM | POA: Diagnosis present

## 2023-10-28 DIAGNOSIS — R918 Other nonspecific abnormal finding of lung field: Secondary | ICD-10-CM | POA: Diagnosis not present

## 2023-10-28 DIAGNOSIS — I2609 Other pulmonary embolism with acute cor pulmonale: Secondary | ICD-10-CM

## 2023-10-28 DIAGNOSIS — I2510 Atherosclerotic heart disease of native coronary artery without angina pectoris: Secondary | ICD-10-CM | POA: Diagnosis not present

## 2023-10-28 DIAGNOSIS — Z91048 Other nonmedicinal substance allergy status: Secondary | ICD-10-CM

## 2023-10-28 DIAGNOSIS — I517 Cardiomegaly: Secondary | ICD-10-CM | POA: Diagnosis not present

## 2023-10-28 DIAGNOSIS — K219 Gastro-esophageal reflux disease without esophagitis: Secondary | ICD-10-CM | POA: Diagnosis not present

## 2023-10-28 DIAGNOSIS — E872 Acidosis, unspecified: Secondary | ICD-10-CM | POA: Diagnosis present

## 2023-10-28 DIAGNOSIS — I2729 Other secondary pulmonary hypertension: Secondary | ICD-10-CM | POA: Diagnosis present

## 2023-10-28 DIAGNOSIS — R4182 Altered mental status, unspecified: Secondary | ICD-10-CM | POA: Insufficient documentation

## 2023-10-28 DIAGNOSIS — Z803 Family history of malignant neoplasm of breast: Secondary | ICD-10-CM

## 2023-10-28 DIAGNOSIS — J4489 Other specified chronic obstructive pulmonary disease: Secondary | ICD-10-CM | POA: Diagnosis present

## 2023-10-28 DIAGNOSIS — I712 Thoracic aortic aneurysm, without rupture, unspecified: Secondary | ICD-10-CM | POA: Diagnosis not present

## 2023-10-28 DIAGNOSIS — E785 Hyperlipidemia, unspecified: Secondary | ICD-10-CM | POA: Diagnosis present

## 2023-10-28 DIAGNOSIS — Z86711 Personal history of pulmonary embolism: Secondary | ICD-10-CM

## 2023-10-28 DIAGNOSIS — Z66 Do not resuscitate: Secondary | ICD-10-CM | POA: Diagnosis present

## 2023-10-28 DIAGNOSIS — Z7952 Long term (current) use of systemic steroids: Secondary | ICD-10-CM | POA: Diagnosis not present

## 2023-10-28 DIAGNOSIS — E8809 Other disorders of plasma-protein metabolism, not elsewhere classified: Secondary | ICD-10-CM | POA: Diagnosis not present

## 2023-10-28 DIAGNOSIS — I480 Paroxysmal atrial fibrillation: Secondary | ICD-10-CM | POA: Diagnosis present

## 2023-10-28 DIAGNOSIS — I5043 Acute on chronic combined systolic (congestive) and diastolic (congestive) heart failure: Secondary | ICD-10-CM | POA: Diagnosis present

## 2023-10-28 DIAGNOSIS — Z9012 Acquired absence of left breast and nipple: Secondary | ICD-10-CM

## 2023-10-28 DIAGNOSIS — E875 Hyperkalemia: Secondary | ICD-10-CM | POA: Diagnosis present

## 2023-10-28 DIAGNOSIS — I130 Hypertensive heart and chronic kidney disease with heart failure and stage 1 through stage 4 chronic kidney disease, or unspecified chronic kidney disease: Secondary | ICD-10-CM | POA: Diagnosis present

## 2023-10-28 DIAGNOSIS — R7989 Other specified abnormal findings of blood chemistry: Secondary | ICD-10-CM

## 2023-10-28 DIAGNOSIS — E46 Unspecified protein-calorie malnutrition: Secondary | ICD-10-CM | POA: Diagnosis present

## 2023-10-28 DIAGNOSIS — J9610 Chronic respiratory failure, unspecified whether with hypoxia or hypercapnia: Secondary | ICD-10-CM | POA: Diagnosis present

## 2023-10-28 DIAGNOSIS — Z5982 Transportation insecurity: Secondary | ICD-10-CM

## 2023-10-28 DIAGNOSIS — T45515A Adverse effect of anticoagulants, initial encounter: Secondary | ICD-10-CM | POA: Diagnosis present

## 2023-10-28 DIAGNOSIS — F17210 Nicotine dependence, cigarettes, uncomplicated: Secondary | ICD-10-CM | POA: Diagnosis not present

## 2023-10-28 DIAGNOSIS — M25561 Pain in right knee: Secondary | ICD-10-CM | POA: Diagnosis not present

## 2023-10-28 DIAGNOSIS — R062 Wheezing: Secondary | ICD-10-CM

## 2023-10-28 DIAGNOSIS — Z9981 Dependence on supplemental oxygen: Secondary | ICD-10-CM | POA: Diagnosis not present

## 2023-10-28 DIAGNOSIS — Z1152 Encounter for screening for COVID-19: Secondary | ICD-10-CM

## 2023-10-28 DIAGNOSIS — K922 Gastrointestinal hemorrhage, unspecified: Secondary | ICD-10-CM | POA: Diagnosis not present

## 2023-10-28 DIAGNOSIS — G9341 Metabolic encephalopathy: Secondary | ICD-10-CM | POA: Diagnosis present

## 2023-10-28 DIAGNOSIS — R34 Anuria and oliguria: Secondary | ICD-10-CM | POA: Diagnosis not present

## 2023-10-28 DIAGNOSIS — R570 Cardiogenic shock: Secondary | ICD-10-CM | POA: Diagnosis present

## 2023-10-28 DIAGNOSIS — D6832 Hemorrhagic disorder due to extrinsic circulating anticoagulants: Secondary | ICD-10-CM

## 2023-10-28 DIAGNOSIS — N1832 Chronic kidney disease, stage 3b: Secondary | ICD-10-CM

## 2023-10-28 DIAGNOSIS — J9611 Chronic respiratory failure with hypoxia: Secondary | ICD-10-CM | POA: Diagnosis not present

## 2023-10-28 DIAGNOSIS — I5020 Unspecified systolic (congestive) heart failure: Secondary | ICD-10-CM | POA: Diagnosis not present

## 2023-10-28 DIAGNOSIS — Z79899 Other long term (current) drug therapy: Secondary | ICD-10-CM | POA: Diagnosis not present

## 2023-10-28 DIAGNOSIS — Z885 Allergy status to narcotic agent status: Secondary | ICD-10-CM

## 2023-10-28 DIAGNOSIS — D539 Nutritional anemia, unspecified: Secondary | ICD-10-CM | POA: Diagnosis not present

## 2023-10-28 DIAGNOSIS — M1712 Unilateral primary osteoarthritis, left knee: Secondary | ICD-10-CM | POA: Diagnosis present

## 2023-10-28 DIAGNOSIS — Z8249 Family history of ischemic heart disease and other diseases of the circulatory system: Secondary | ICD-10-CM

## 2023-10-28 DIAGNOSIS — R0602 Shortness of breath: Principal | ICD-10-CM

## 2023-10-28 DIAGNOSIS — N1831 Chronic kidney disease, stage 3a: Secondary | ICD-10-CM | POA: Diagnosis not present

## 2023-10-28 DIAGNOSIS — Z853 Personal history of malignant neoplasm of breast: Secondary | ICD-10-CM

## 2023-10-28 DIAGNOSIS — Z7901 Long term (current) use of anticoagulants: Secondary | ICD-10-CM

## 2023-10-28 DIAGNOSIS — Z809 Family history of malignant neoplasm, unspecified: Secondary | ICD-10-CM

## 2023-10-28 DIAGNOSIS — Z6825 Body mass index (BMI) 25.0-25.9, adult: Secondary | ICD-10-CM

## 2023-10-28 DIAGNOSIS — J449 Chronic obstructive pulmonary disease, unspecified: Secondary | ICD-10-CM | POA: Diagnosis present

## 2023-10-28 DIAGNOSIS — D649 Anemia, unspecified: Secondary | ICD-10-CM

## 2023-10-28 DIAGNOSIS — Z7984 Long term (current) use of oral hypoglycemic drugs: Secondary | ICD-10-CM | POA: Diagnosis not present

## 2023-10-28 DIAGNOSIS — R627 Adult failure to thrive: Secondary | ICD-10-CM | POA: Insufficient documentation

## 2023-10-28 DIAGNOSIS — R058 Other specified cough: Secondary | ICD-10-CM

## 2023-10-28 DIAGNOSIS — E16A2 Hypoglycemia level 2: Secondary | ICD-10-CM | POA: Diagnosis present

## 2023-10-28 DIAGNOSIS — G8929 Other chronic pain: Secondary | ICD-10-CM | POA: Diagnosis not present

## 2023-10-28 DIAGNOSIS — Z886 Allergy status to analgesic agent status: Secondary | ICD-10-CM

## 2023-10-28 DIAGNOSIS — I509 Heart failure, unspecified: Secondary | ICD-10-CM

## 2023-10-28 LAB — COMPREHENSIVE METABOLIC PANEL WITH GFR
ALT: 23 U/L (ref 0–44)
AST: 33 U/L (ref 15–41)
Albumin: 3.2 g/dL — ABNORMAL LOW (ref 3.5–5.0)
Alkaline Phosphatase: 123 U/L (ref 38–126)
Anion gap: 16 — ABNORMAL HIGH (ref 5–15)
BUN: 39 mg/dL — ABNORMAL HIGH (ref 8–23)
CO2: 20 mmol/L — ABNORMAL LOW (ref 22–32)
Calcium: 9.6 mg/dL (ref 8.9–10.3)
Chloride: 99 mmol/L (ref 98–111)
Creatinine, Ser: 2.97 mg/dL — ABNORMAL HIGH (ref 0.44–1.00)
GFR, Estimated: 16 mL/min — ABNORMAL LOW (ref >60)
Glucose, Bld: 75 mg/dL (ref 70–99)
Potassium: 4.4 mmol/L (ref 3.5–5.1)
Sodium: 135 mmol/L (ref 135–145)
Total Bilirubin: 1.1 mg/dL (ref 0.0–1.2)
Total Protein: 7.3 g/dL (ref 6.5–8.1)

## 2023-10-28 LAB — I-STAT CG4 LACTIC ACID, ED
Lactic Acid, Venous: 7 mmol/L (ref 0.5–1.9)
Lactic Acid, Venous: 8 mmol/L (ref 0.5–1.9)
Lactic Acid, Venous: 8.9 mmol/L (ref 0.5–1.9)

## 2023-10-28 LAB — CBC WITH DIFFERENTIAL/PLATELET
Abs Immature Granulocytes: 0.08 10*3/uL — ABNORMAL HIGH (ref 0.00–0.07)
Basophils Absolute: 0.2 10*3/uL — ABNORMAL HIGH (ref 0.0–0.1)
Basophils Relative: 2 %
Eosinophils Absolute: 0.5 10*3/uL (ref 0.0–0.5)
Eosinophils Relative: 4 %
HCT: 30.5 % — ABNORMAL LOW (ref 36.0–46.0)
Hemoglobin: 8.5 g/dL — ABNORMAL LOW (ref 12.0–15.0)
Immature Granulocytes: 1 %
Lymphocytes Relative: 19 %
Lymphs Abs: 2.2 10*3/uL (ref 0.7–4.0)
MCH: 26.6 pg (ref 26.0–34.0)
MCHC: 27.9 g/dL — ABNORMAL LOW (ref 30.0–36.0)
MCV: 95.6 fL (ref 80.0–100.0)
Monocytes Absolute: 1.7 10*3/uL — ABNORMAL HIGH (ref 0.1–1.0)
Monocytes Relative: 15 %
Neutro Abs: 6.8 10*3/uL (ref 1.7–7.7)
Neutrophils Relative %: 59 %
Platelets: 395 10*3/uL (ref 150–400)
RBC: 3.19 MIL/uL — ABNORMAL LOW (ref 3.87–5.11)
RDW: 21.3 % — ABNORMAL HIGH (ref 11.5–15.5)
Smear Review: NORMAL
WBC: 11.4 10*3/uL — ABNORMAL HIGH (ref 4.0–10.5)
nRBC: 0.4 % — ABNORMAL HIGH (ref 0.0–0.2)

## 2023-10-28 LAB — BASIC METABOLIC PANEL WITH GFR
Anion gap: 19 — ABNORMAL HIGH (ref 5–15)
BUN: 40 mg/dL — ABNORMAL HIGH (ref 8–23)
CO2: 17 mmol/L — ABNORMAL LOW (ref 22–32)
Calcium: 9.4 mg/dL (ref 8.9–10.3)
Chloride: 97 mmol/L — ABNORMAL LOW (ref 98–111)
Creatinine, Ser: 3.34 mg/dL — ABNORMAL HIGH (ref 0.44–1.00)
GFR, Estimated: 14 mL/min — ABNORMAL LOW (ref >60)
Glucose, Bld: 53 mg/dL — ABNORMAL LOW (ref 70–99)
Potassium: 5.6 mmol/L — ABNORMAL HIGH (ref 3.5–5.1)
Sodium: 133 mmol/L — ABNORMAL LOW (ref 135–145)

## 2023-10-28 LAB — POCT INR: INR: 6.3 — AB (ref 2.0–3.0)

## 2023-10-28 LAB — TROPONIN I (HIGH SENSITIVITY): Troponin I (High Sensitivity): 30 ng/L — ABNORMAL HIGH (ref <18)

## 2023-10-28 LAB — MAGNESIUM: Magnesium: 2.3 mg/dL (ref 1.7–2.4)

## 2023-10-28 LAB — CBG MONITORING, ED
Glucose-Capillary: 34 mg/dL — CL (ref 70–99)
Glucose-Capillary: 111 mg/dL — ABNORMAL HIGH (ref 70–99)

## 2023-10-28 LAB — RESP PANEL BY RT-PCR (RSV, FLU A&B, COVID)  RVPGX2
Influenza A by PCR: NEGATIVE
Influenza B by PCR: NEGATIVE
Resp Syncytial Virus by PCR: NEGATIVE
SARS Coronavirus 2 by RT PCR: NEGATIVE

## 2023-10-28 LAB — PROCALCITONIN: Procalcitonin: 1.3 ng/mL

## 2023-10-28 LAB — BRAIN NATRIURETIC PEPTIDE: B Natriuretic Peptide: 1065 pg/mL — ABNORMAL HIGH (ref 0.0–100.0)

## 2023-10-28 MED ORDER — ALBUTEROL SULFATE (2.5 MG/3ML) 0.083% IN NEBU
5.0000 mg | INHALATION_SOLUTION | Freq: Once | RESPIRATORY_TRACT | Status: AC
  Administered 2023-10-28: 5 mg via RESPIRATORY_TRACT
  Filled 2023-10-28: qty 6

## 2023-10-28 MED ORDER — SODIUM CHLORIDE 0.9 % IV SOLN
1.0000 g | Freq: Once | INTRAVENOUS | Status: AC
  Administered 2023-10-28: 1 g via INTRAVENOUS
  Filled 2023-10-28: qty 10

## 2023-10-28 MED ORDER — IPRATROPIUM-ALBUTEROL 0.5-2.5 (3) MG/3ML IN SOLN
3.0000 mL | RESPIRATORY_TRACT | Status: DC | PRN
Start: 2023-10-28 — End: 2023-10-29

## 2023-10-28 MED ORDER — LACTATED RINGERS IV BOLUS
500.0000 mL | Freq: Once | INTRAVENOUS | Status: AC
  Administered 2023-10-28: 500 mL via INTRAVENOUS

## 2023-10-28 MED ORDER — IPRATROPIUM BROMIDE 0.02 % IN SOLN
0.5000 mg | Freq: Once | RESPIRATORY_TRACT | Status: AC
  Administered 2023-10-28: 0.5 mg via RESPIRATORY_TRACT
  Filled 2023-10-28: qty 2.5

## 2023-10-28 MED ORDER — BUDESON-GLYCOPYRROL-FORMOTEROL 160-9-4.8 MCG/ACT IN AERO: 2.0000 | INHALATION_SPRAY | Freq: Two times a day (BID) | RESPIRATORY_TRACT | Status: DC

## 2023-10-28 MED ORDER — DEXTROSE 50 % IV SOLN: 1.0000 | Freq: Once | INTRAVENOUS | Status: AC

## 2023-10-28 MED ORDER — METHYLPREDNISOLONE SODIUM SUCC 125 MG IJ SOLR
125.0000 mg | Freq: Once | INTRAMUSCULAR | Status: AC
  Administered 2023-10-28: 125 mg via INTRAVENOUS
  Filled 2023-10-28: qty 2

## 2023-10-28 MED ORDER — SENNOSIDES-DOCUSATE SODIUM 8.6-50 MG PO TABS
1.0000 | ORAL_TABLET | Freq: Every evening | ORAL | Status: DC
  Administered 2023-10-29: 1 via ORAL
  Filled 2023-10-28: qty 1

## 2023-10-28 MED ORDER — FUROSEMIDE 10 MG/ML IJ SOLN: 80.0000 mg | Freq: Once | INTRAMUSCULAR | Status: DC

## 2023-10-28 MED ORDER — WARFARIN - PHARMACIST DOSING INPATIENT: Freq: Every day | Status: DC

## 2023-10-28 MED ORDER — FUROSEMIDE 10 MG/ML IJ SOLN
120.0000 mg | Freq: Once | INTRAVENOUS | Status: AC
  Administered 2023-10-28: 120 mg via INTRAVENOUS
  Filled 2023-10-28: qty 10

## 2023-10-28 MED ORDER — DEXTROSE 50 % IV SOLN
INTRAVENOUS | Status: AC
  Administered 2023-10-28: 50 mL via INTRAVENOUS
  Filled 2023-10-28: qty 50

## 2023-10-28 MED ORDER — SODIUM CHLORIDE 0.9 % IV SOLN: 1.0000 g | INTRAVENOUS | Status: DC

## 2023-10-28 MED ORDER — PREDNISONE 20 MG PO TABS: 40.0000 mg | ORAL_TABLET | Freq: Every day | ORAL | Status: DC

## 2023-10-28 MED ORDER — AZITHROMYCIN 250 MG PO TABS: 500.0000 mg | ORAL_TABLET | Freq: Every day | ORAL | Status: DC

## 2023-10-28 MED ORDER — HYDROCODONE-ACETAMINOPHEN 5-325 MG PO TABS
1.0000 | ORAL_TABLET | ORAL | Status: DC | PRN
  Administered 2023-10-29: 1 via ORAL
  Filled 2023-10-28: qty 1

## 2023-10-28 MED ORDER — ONDANSETRON HCL 4 MG/2ML IJ SOLN
4.0000 mg | Freq: Once | INTRAMUSCULAR | Status: AC
  Administered 2023-10-28: 4 mg via INTRAVENOUS
  Filled 2023-10-28: qty 2

## 2023-10-28 MED ORDER — SIMVASTATIN 20 MG PO TABS
20.0000 mg | ORAL_TABLET | Freq: Every day | ORAL | Status: DC
  Filled 2023-10-28 (×2): qty 1

## 2023-10-28 NOTE — Assessment & Plan Note (Addendum)
 Pt with 5 admission since start of this year. She had multiple admissions that required milrinone . Did initiate some GOC during our hospital follow up but pt and daughter maybe more receptive if multidisciplinary approach was used. Would continue to discuss this with patient at follow up visit and involve palliative care as well.

## 2023-10-28 NOTE — Progress Notes (Signed)
 Anticoagulation Management Kim Anthony is a 73 y.o. female who reports to the clinic for monitoring of warfarin treatment.    Indication: PE , History of *(remote); recent hospitalization, was told she has atrial fibrillation--during this visit, [facility] and told she is to be seen for her "AFib". History of mechanical prosthetic heart valve requiring INR range 2.5 - 3.5 on chronic anticoagulation with warfarin.  Duration: indefinite Supervising physician: Christain Courser, MD  Anticoagulation Clinic Visit History: Patient does not report signs/symptoms of bleeding or thromboembolism  Other recent changes: See recent hospitalization records. Was observed to be hypoalbunemic during her hospital stay with decreased dietary intake. Taking 3 mg warfarin since discharge (discharged with prescription for new solid oral dosage strength of warfarin--the tan-colored, 3 mg strength warfarin tablets.  Anticoagulation Episode Summary     Current INR goal:  2.5-3.5  TTR:  29.1% (7.4 y)  Next INR check:  05/17/2023  INR from last check:  6.3 (10/28/2023)  Weekly max warfarin dose:  --  Target end date:  --  INR check location:  Anticoagulation Clinic  Preferred lab:  --  Send INR reminders to:  --   Indications   Pulmonary embolism (HCC) [I26.99]        Comments:  --         Allergies  Allergen Reactions   Lisinopril  Swelling   Acetaminophen -Codeine Itching   Propoxyphene Itching    Darvocet   Tape Rash    Plastic   Tramadol  Itching, Nausea And Vomiting and Rash    Current Outpatient Medications:    albuterol  (VENTOLIN  HFA) 108 (90 Base) MCG/ACT inhaler, Inhale 1 puff into the lungs every 4 (four) hours as needed for wheezing or shortness of breath., Disp: 9 g, Rfl: 0   [Paused] allopurinol  (ZYLOPRIM ) 100 MG tablet, Take 0.5 tablets (50 mg total) by mouth every other day. (Patient not taking: Reported on 10/19/2023), Disp: 30 tablet, Rfl: 2    colchicine  0.6 MG tablet, Take 0.5 tablets (0.3 mg total) by mouth daily., Disp: 30 tablet, Rfl: 1   [Paused] FARXIGA  10 MG TABS tablet, Take 1 tablet by mouth once daily, Disp: 90 tablet, Rfl: 0   fluticasone  (FLONASE ) 50 MCG/ACT nasal spray, Place 2 sprays into both nostrils daily. (Patient taking differently: Place 1 spray into both nostrils daily.), Disp: 11.1 mL, Rfl: 3   Fluticasone -Umeclidin-Vilant (TRELEGY ELLIPTA ) 100-62.5-25 MCG/ACT AEPB, Inhale 1 puff into the lungs daily., Disp: 60 each, Rfl: 3   gabapentin  (NEURONTIN ) 100 MG capsule, Take 2 capsules (200 mg total) by mouth 3 (three) times daily., Disp: 30 capsule, Rfl: 0   HYDROcodone -acetaminophen  (NORCO/VICODIN) 5-325 MG tablet, Take 1 tablet by mouth every 4 (four) hours as needed for moderate pain (pain score 4-6). Diagnosis- chronic low back pain, Disp: 150 tablet, Rfl: 0   hydrOXYzine  (ATARAX ) 10 MG tablet, TAKE 1 TABLET BY MOUTH THREE TIMES DAILY AS NEEDED FOR ANXIETY, Disp: 30 tablet, Rfl: 0   [Paused] losartan  (COZAAR ) 25 MG tablet, Take 0.5 tablets (12.5 mg total) by mouth daily., Disp: 45 tablet, Rfl: 3   metoprolol  tartrate (LOPRESSOR ) 25 MG tablet, Take 1 tablet (25 mg total) by mouth 2 (two) times daily., Disp: 60 tablet, Rfl: 11   potassium chloride  (KLOR-CON  M) 10 MEQ tablet, Take 2 tablets (20 mEq total) by mouth daily., Disp: 60 tablet, Rfl: 3   senna-docusate (SENOKOT-S) 8.6-50 MG tablet, Take 1 tablet by mouth at bedtime as needed for mild constipation.,  Disp: 30 tablet, Rfl: 0   simvastatin  (ZOCOR ) 20 MG tablet, Take 1 tablet (20 mg total) by mouth daily., Disp: 90 tablet, Rfl: 2   torsemide  (DEMADEX ) 20 MG tablet, Take 2 tablets (40 mg total) by mouth daily., Disp: 60 tablet, Rfl: 0   Vitamin D , Ergocalciferol , (DRISDOL ) 1.25 MG (50000 UNIT) CAPS capsule, Take 1 capsule (50,000 Units total) by mouth every 7 (seven) days., Disp: 5 capsule, Rfl: 0   warfarin (COUMADIN ) 3 MG tablet, Take 1 tablet (3 mg total) by mouth  daily at 12 noon. Follow up with Dr. Margit Shelling, Disp: 30 tablet, Rfl: 0 Past Medical History:  Diagnosis Date   Acute delirium 08/30/2023   Acute GI bleeding 08/18/2023   Arthritis    Asthma    Breast cancer (HCC) 2001   Left Breast Cancer   CHF (congestive heart failure) (HCC)    Closed fracture of right hip (HCC) 09/23/2022   Closed intertrochanteric fracture of right femur, initial encounter (HCC) 09/22/2022   COPD (chronic obstructive pulmonary disease) (HCC)    Coronary artery disease    GERD (gastroesophageal reflux disease)    Headache, common migraine 07/11/2015   History of mitral valve prosthesis 07/31/2023   History of mitral valve replacement with mechanical valve    HLD (hyperlipidemia)    Hypertension    Personal history of breast cancer    Left Breast Cancer     Pulmonary embolism (HCC) 2021   Pulmonary hypertension, low resistance (HCC) 08/20/2023   RVF (right ventricular failure) (HCC) 07/31/2023   Social History   Socioeconomic History   Marital status: Single    Spouse name: Not on file   Number of children: Not on file   Years of education: Not on file   Highest education level: Not on file  Occupational History   Not on file  Tobacco Use   Smoking status: Every Day    Current packs/day: 0.10    Average packs/day: 0.1 packs/day for 30.0 years (3.0 ttl pk-yrs)    Types: Cigarettes   Smokeless tobacco: Never   Tobacco comments:    5 cigs per day  Vaping Use   Vaping status: Never Used  Substance and Sexual Activity   Alcohol use: Yes    Alcohol/week: 3.0 standard drinks of alcohol    Types: 3 Standard drinks or equivalent per week    Comment: beer- ocasional   Drug use: No   Sexual activity: Not Currently    Birth control/protection: None  Other Topics Concern   Not on file  Social History Narrative   ** Merged History Encounter **       Social Drivers of Health   Financial Resource Strain: Low Risk  (12/01/2022)   Overall Financial Resource  Strain (CARDIA)    Difficulty of Paying Living Expenses: Not very hard  Food Insecurity: No Food Insecurity (10/19/2023)   Hunger Vital Sign    Worried About Running Out of Food in the Last Year: Never true    Ran Out of Food in the Last Year: Never true  Transportation Needs: Unmet Transportation Needs (10/19/2023)   PRAPARE - Administrator, Civil Service (Medical): Yes    Lack of Transportation (Non-Medical): No  Physical Activity: Inactive (12/01/2022)   Exercise Vital Sign    Days of Exercise per Week: 0 days    Minutes of Exercise per Session: 10 min  Stress: Stress Concern Present (12/01/2022)   [employer] of Occupational Health - Occupational Stress  Questionnaire    Feeling of Stress : To some extent  Social Connections: Socially Isolated (10/19/2023)   Social Connection and Isolation Panel [NHANES]    Frequency of Communication with Friends and Family: More than three times a week    Frequency of Social Gatherings with Friends and Family: More than three times a week    Attends Religious Services: Never    Database administrator or Organizations: No    Attends Engineer, structural: Never    Marital Status: Never married   Family History  Problem Relation Age of Onset   Hypertension Mother    Cancer Father    Hypertension Father    Breast cancer Maternal Aunt 50   Heart attack Other     ASSESSMENT Recent Results: The most recent result is correlated with 3 mg warfarin for hospital discharge day and the two days subsequently.l None today. INR DAY OF DISCHARGE WAS 3.2. Lab Results  Component Value Date   INR 6.3 (A) 10/28/2023   INR 3.2 (H) 10/26/2023   INR 2.9 (H) 10/25/2023    Anticoagulation Dosing: Description   OMIT/DO NOT TAKE warfarin/Coumadin  on Thursday May 8 or Friday May 9. Recommence taking warfarin on Saturday May 10. Take one (1) of your tan colored 3 mg strength warfarin tablets on Saturday. Take one-half (1/2) tablet on  Sunday. On Monday, May 12--perform a patient self testing, finger-stick, point of care INR test at home. Text or call Dr. Margit Shelling [phone] with results and await his return call or text for new dosing instructions.      INR today: Supratherapeutic  PLAN Weekly dose was modified as discussed above.   Patient Instructions  Patient instructed to take medications as defined in the Anti-coagulation Track section of this encounter.  Patient instructed to OMIT/DO NOT TAKE today's dose, OR TOMORROWS DOSE. Patient instructed to OMIT/DO NOT TAKE warfarin/Coumadin  on Thursday May 8 or Friday May 9. Recommence taking warfarin on Saturday May 10. Take one (1) of your tan colored 3 mg strength warfarin tablets on Saturday. Take one-half (1/2) tablet on Sunday. On Monday, May 12--perform a patient self testing, finger-stick, point of care INR test at home. Text or call Dr. Margit Shelling [phone] with results and await his return call or text for new dosing instructions.  Patient verbalized understanding of these instructions.  Patient advised to contact clinic or seek medical attention if signs/symptoms of bleeding or thromboembolism occur.  Patient verbalized understanding by repeating back information and was advised to contact me if further medication-related questions arise. Patient was also provided an information handout.  Follow-up Return in 4 days (on 11/01/2023) for Follow up INR.  Kenda Paula, PharmD, CPP Clinical Pharmacist Practitioner  15 minutes spent face-to-face with the patient during the encounter. 50% of time spent on education, including signs/sx bleeding and clotting, as well as food and drug interactions with warfarin. 50% of time was spent on fingerprick POC INR sample collection,processing, results determination, and documentation in [email or web address].

## 2023-10-28 NOTE — Transitions of Care (Post Inpatient/ED Visit) (Signed)
   10/28/2023  Name: Kim Anthony [name]: [id number] DOB: 09-06-1950  Today's TOC FU Call Status: Today's TOC FU Call Status:: Successful TOC FU Call Completed TOC FU Call Complete Date: 10/28/23 (Spoke with patient who declined participation in call program and states she already saw her PCP and coumadin  clinic today - TOC RN asked patient if she'd discussed todays results and she states she has and is adjusting courmadin as directed.)  Patient's Name and Date of Birth confirmed.   Tonia Frankel RN, CCM [facility]  VBCI-Population Health RN Care Manager [phone]

## 2023-10-28 NOTE — Assessment & Plan Note (Signed)
 Patient recently admitted for AKI that required CRRT due to shock. During hospitalization follow up, lab work shows AKI on CKDIIIa. Given her complicated hx, will admit her for gentle IVF. She appears hypovolemic to euvolemic on exam. Initially plan was to hold the diuretic and repeat blood work on Monday but this might not be safe option given her cardiac hx.

## 2023-10-28 NOTE — Patient Instructions (Addendum)
 Ms.Jumanah B Kamer, it was a pleasure seeing you today! You endorsed feeling well today. Below are some of the things we talked about this visit. We look forward to seeing you in the follow up appointment!  Today we discussed: Continue your current medications. We will continue to hold the losartan , farxiga  and allopurinol .   Keep taking the torsemide  at 40 mg daily.   Monitor your weights. Ensure you limit your fluid intake to 2 liter.   Keep holding your warfarin until Saturday as instructed by Dr. Margit Shelling.     I have ordered the following labs today:  Lab Orders  No laboratory test(s) ordered today      Referrals ordered today:   Referral Orders  No referral(s) requested today     I have ordered the following medication/changed the following medications:   Stop the following medications: Medications Discontinued During This Encounter  Medication Reason   hydrOXYzine  (ATARAX ) 10 MG tablet Discontinued by provider     Start the following medications: No orders of the defined types were placed in this encounter.    Follow-up: 1 week follow up with Dr. Meredeth Stallion   Please make sure to arrive 15 minutes prior to your next appointment. If you arrive late, you may be asked to reschedule.   We look forward to seeing you next time. Please call our clinic at [phone] if you have any questions or concerns. The best time to call is Monday-Friday from 9am-4pm, but there is someone available 24/7. If after hours or the weekend, call the main hospital number and ask for the Internal Medicine Resident On-Call. If you need medication refills, please notify your pharmacy one week in advance and they will send us  a request.  Thank you for letting us  take part in your care. Wishing you the best!  Thank you, Jackolyn Masker, MD

## 2023-10-28 NOTE — Telephone Encounter (Signed)
 Pt daughter communicating between RN and pt.   Chief Complaint: SOB Symptoms: dizziness, SOB with exertion,  Frequency: worse since yesterday Pertinent Negatives: Patient denies fever, CP,  Disposition: [x] ED /[] Urgent Care (no appt availability in office) / [] Appointment(In office/virtual)/ []  [facility]/ [] Home Care/ [] Refused Recommended Disposition /[] [facility]/ []  Follow-up with PCP Additional Notes: Pt daughter states that she was seen earlier today for difficulty breathing. Daughter states that PCP wanted to direct admit her but states that there is no beds available and they are waiting for a bed at this time. Daughter states that pt appears to be SOB even at rest, pt states that she is SOB with exertion and has increased her home O2 up to 3 LPM. Pt states that she does not have a fever, but that she is chilled. Coughing up yellow/green phlegm. Pt wants to go to the hosp. Pt does not have a pulse ox at home. BP 118/75 HR 48.   Reason for Disposition . [1] MODERATE difficulty breathing (e.g., speaks in phrases, SOB even at rest, pulse 100-120) AND [2] NEW-onset or WORSE than normal  Answer Assessment - Initial Assessment Questions 1. RESPIRATORY STATUS: "Describe your breathing?" (e.g., wheezing, shortness of breath, unable to speak, severe coughing)      SOB, coughing up yellow/green phlegm 3. PATTERN "Does the difficult breathing come and go, or has it been constant since it started?"      Per daughter today is worse, pt is on home O2, increased spo2 to 3 LPM 4. SEVERITY: "How bad is your breathing?" (e.g., mild, moderate, severe)    - MILD: No SOB at rest, mild SOB with walking, speaks normally in sentences, can lie down, no retractions, pulse < 100.    - MODERATE: SOB at rest, SOB with minimal exertion and prefers to sit, cannot lie down flat, speaks in phrases, mild retractions, audible wheezing, pulse 100-120.    - SEVERE: Very SOB at rest, speaks in single  words, struggling to breathe, sitting hunched forward, retractions, pulse > 120      moderate 5. RECURRENT SYMPTOM: "Have you had difficulty breathing before?" If Yes, ask: "When was the last time?" and "What happened that time?"      Yes has had this in the past, pt daughter states that this was the presentation when she has had CHF issues in the past 6. CARDIAC HISTORY: "Do you have any history of heart disease?" (e.g., heart attack, angina, bypass surgery, angioplasty)      CHF 7. LUNG HISTORY: "Do you have any history of lung disease?"  (e.g., pulmonary embolus, asthma, emphysema)     COPD and CHF 8. CAUSE: "What do you think is causing the breathing problem?"      fluid 9. OTHER SYMPTOMS: "Do you have any other symptoms? (e.g., dizziness, runny nose, cough, chest pain, fever)     Dizziness, 10. O2 SATURATION MONITOR:  "Do you use an oxygen  saturation monitor (pulse oximeter) at home?" If Yes, ask: "What is your reading (oxygen  level) today?" "What is your usual oxygen  saturation reading?" (e.g., 95%)       Denies having equip  Protocols used: Breathing Difficulty-A-AH

## 2023-10-28 NOTE — Telephone Encounter (Signed)
 RTC to [facility] for Verbal orders for skilled Nursing for patient.  2 times a week for 1 week and then 1 time a week for 1 week.  To continue with teaching for Heart Failure. Upon patient's return from visit today she was extremely tired.  Vomiting while Nurse was there but it subsided.  Is on Oxygen  at the 3 liters .  Shortness of breath on exertion.  Coughing up thick green Mucous.  Was unable to get a Pulse-Ox reading on patient today.  Wanted to make doctor aware of what is going on with the patient.  OK was given for the Nurse visits.  Will forward information to Dr. Meredeth Stallion. Copied from CRM [id number]. Topic: Clinical - Home Health Verbal Orders >> Oct 28, 2023 12:36 PM Adrianna P wrote: Caller/Agency: [facility] Callback Number: [phone] Service Requested: Skilled Nursing Frequency: twice a week next week and then once a week the following  Any new concerns about the patient? Yes. Coughing up a lot of Phlegm, patient also dealing with nausea

## 2023-10-28 NOTE — Telephone Encounter (Signed)
 FYI: Pt has been seen for her visit today.  Copied from CRM [id number]. Topic: General - Running Late >> Oct 28, 2023  8:48 AM Kim Anthony wrote: Patient/patient representative is calling because they are running late for an appointment. She said she will be there shortly, she had a hard time this morning due to shortness of breath

## 2023-10-28 NOTE — Assessment & Plan Note (Signed)
 INR was elevated at 6.3. Suspect this is secondary to AKI on CKD. CBC was checked that did not show any signs of blood loss. Per Dr. Margit Shelling, hold warfarin until Saturday and repeat INR at that time (see note).

## 2023-10-28 NOTE — Telephone Encounter (Signed)
 Called bed control [phone]) @ 3:41 pm. Requesting direct admitt bed/ med tele bed / AKI on CKD/ spoke with Dawn, at the moment there are not beds. Called and spoke with daughter to let her know that bed control will call when there is a bed available.  They will call the daughter @ [phone].

## 2023-10-28 NOTE — Transitions of Care (Post Inpatient/ED Visit) (Signed)
   10/28/2023  Name: Kim Anthony [name]: [id number] DOB: September 22, 1950  Today's TOC FU Call Status: Today's TOC FU Call Status:: Unsuccessful Call (2nd Attempt) Unsuccessful Call (2nd Attempt) Date: 10/28/23  Attempted to reach the patient regarding the most recent Inpatient/ED visit.  Follow Up Plan: Additional outreach attempts will be made to reach the patient to complete the Transitions of Care (Post Inpatient/ED visit) call.   Tonia Frankel RN, CCM [facility]  VBCI-Population Health RN Care Manager [phone]

## 2023-10-28 NOTE — Patient Instructions (Signed)
 Patient instructed to take medications as defined in the Anti-coagulation Track section of this encounter.  Patient instructed to OMIT/DO NOT TAKE today's dose, OR TOMORROWS DOSE. Patient instructed to OMIT/DO NOT TAKE warfarin/Coumadin  on Thursday May 8 or Friday May 9. Recommence taking warfarin on Saturday May 10. Take one (1) of your tan colored 3 mg strength warfarin tablets on Saturday. Take one-half (1/2) tablet on Sunday. On Monday, May 12--perform a patient self testing, finger-stick, point of care INR test at home. Text or call Dr. Margit Shelling [phone] with results and await his return call or text for new dosing instructions.  Patient verbalized understanding of these instructions.

## 2023-10-28 NOTE — ED Notes (Signed)
 Patient given sandwich and applesauce. Tolerating PO intake, Drinking a coca-cola soda (no potassium)

## 2023-10-28 NOTE — ED Provider Notes (Addendum)
 [place] EMERGENCY DEPARTMENT AT [facility] Provider Note   CSN: [id number] Arrival date & time: 10/28/23  1649     History  Chief Complaint  Patient presents with   Shortness of Breath    Kim Anthony is a 73 y.o. female.  Pt with hx copd, chf, recent admission with aki, presents with increased sob in past few days. +cough, occasionally w yellowish phlegm. No sore throat. No known ill contacts. No fever or chills. Denies chest pain or discomfort. No abd pain or nv. No extremity pain or swelling. Hx afib, hx mech valve, on coumadin , recent inr 6. No recent blood loss, rectal bleeding or melena. Former smoker, indicates quit last month, uses albuterol  prn, and home o2 2 liters. Pt indicates has urinated today and has felt is making normal amount of urine, denies feeling full/distended - no abd or flank pain.   The history is provided by the patient and medical records.  Shortness of Breath Associated symptoms: cough and wheezing   Associated symptoms: no abdominal pain, no chest pain, no fever, no headaches, no neck pain, no rash, no sore throat and no vomiting        Home Medications Prior to Admission medications   Medication Sig Start Date End Date Taking? Authorizing Provider  albuterol  (VENTOLIN  HFA) 108 (90 Base) MCG/ACT inhaler Inhale 1 puff into the lungs every 4 (four) hours as needed for wheezing or shortness of breath. 09/14/23   Carleen Chary, DO  allopurinol  (ZYLOPRIM ) 100 MG tablet Take 0.5 tablets (50 mg total) by mouth every other day. Patient not taking: Reported on 10/19/2023 10/13/23   Malen Scudder, DO  colchicine  0.6 MG tablet Take 0.5 tablets (0.3 mg total) by mouth daily. 10/08/23   Cleven Dallas, DO  FARXIGA  10 MG TABS tablet Take 1 tablet by mouth once daily 08/31/23   Amoako, Prince, MD  fluticasone  (FLONASE ) 50 MCG/ACT nasal spray Place 2 sprays into both nostrils daily. Patient taking differently: Place 1 spray into both nostrils daily.  05/17/23 05/16/24  Amoako, Prince, MD  Fluticasone -Umeclidin-Vilant (TRELEGY ELLIPTA ) 100-62.5-25 MCG/ACT AEPB Inhale 1 puff into the lungs daily. 09/14/23   Carleen Chary, DO  gabapentin  (NEURONTIN ) 100 MG capsule Take 2 capsules (200 mg total) by mouth 3 (three) times daily. 10/08/23   Cleven Dallas, DO  HYDROcodone -acetaminophen  (NORCO/VICODIN) 5-325 MG tablet Take 1 tablet by mouth every 4 (four) hours as needed for moderate pain (pain score 4-6). Diagnosis- chronic low back pain 10/27/23   Lovorn, Megan, MD  losartan  (COZAAR ) 25 MG tablet Take 0.5 tablets (12.5 mg total) by mouth daily. 08/10/23 11/08/23  Elmarie Hacking, FNP  metoprolol  tartrate (LOPRESSOR ) 25 MG tablet Take 1 tablet (25 mg total) by mouth 2 (two) times daily. 10/26/23   Sandie Cross, MD  potassium chloride  (KLOR-CON  M) 10 MEQ tablet Take 2 tablets (20 mEq total) by mouth daily. 09/07/23 04/04/24  [name], MD  senna-docusate (SENOKOT-S) 8.6-50 MG tablet Take 1 tablet by mouth at bedtime as needed for mild constipation. 10/08/23   Cleven Dallas, DO  simvastatin  (ZOCOR ) 20 MG tablet Take 1 tablet (20 mg total) by mouth daily. 12/03/22   Allana Arabia, MD  torsemide  (DEMADEX ) 20 MG tablet Take 2 tablets (40 mg total) by mouth daily. 10/27/23   [name], MD  Vitamin D , Ergocalciferol , (DRISDOL ) 1.25 MG (50000 UNIT) CAPS capsule Take 1 capsule (50,000 Units total) by mouth every 7 (seven) days. 10/11/23   Cleven Dallas, DO  warfarin (COUMADIN ) 3 MG tablet Take 1 tablet (3 mg total) by mouth daily at 12 noon. Follow up with Dr. Margit Shelling 10/26/23   [name], MD      Allergies    Lisinopril , Acetaminophen -codeine, Propoxyphene, Tape, and Tramadol     Review of Systems   Review of Systems  Constitutional:  Negative for chills and fever.  HENT:  Negative for sore throat.   Eyes:  Negative for redness.  Respiratory:  Positive for cough, shortness of breath and wheezing.   Cardiovascular:  Negative for chest  pain, palpitations and leg swelling.  Gastrointestinal:  Negative for abdominal pain, diarrhea and vomiting.  Genitourinary:  Negative for dysuria and flank pain.  Musculoskeletal:  Negative for back pain and neck pain.  Skin:  Negative for rash.  Neurological:  Negative for headaches.  Psychiatric/Behavioral:  Negative for confusion.     Physical Exam Updated Vital Signs BP (!) 85/61   Pulse 64   Temp 98.4 F (36.9 C) (Oral)   Resp 20   Ht 1.651 m (5\' 5" )   Wt 70.4 kg   SpO2 96% Comment: 3L [place]  BMI 25.83 kg/m  Physical Exam Vitals and nursing note reviewed.  Constitutional:      Appearance: Normal appearance. She is well-developed.  HENT:     Head: Atraumatic.     Nose: Nose normal.     Mouth/Throat:     Mouth: Mucous membranes are moist.     Pharynx: Oropharynx is clear.  Eyes:     General: No scleral icterus.    Conjunctiva/sclera: Conjunctivae normal.     Pupils: Pupils are equal, round, and reactive to light.  Neck:     Trachea: No tracheal deviation.     Comments: No stiffness or rigidity.  Cardiovascular:     Rate and Rhythm: Normal rate and regular rhythm.     Pulses: Normal pulses.     Heart sounds: Normal heart sounds. No murmur heard.    No friction rub. No gallop.  Pulmonary:     Effort: Pulmonary effort is normal. No respiratory distress.     Breath sounds: Normal breath sounds.  Abdominal:     General: Bowel sounds are normal. There is no distension.     Palpations: Abdomen is soft.     Tenderness: There is no abdominal tenderness.  Musculoskeletal:        General: No swelling or tenderness.     Cervical back: Normal range of motion and neck supple. No rigidity. No muscular tenderness.     Right lower leg: No edema.     Left lower leg: No edema.  Skin:    General: Skin is warm and dry.     Findings: No rash.  Neurological:     Mental Status: She is alert.     Comments: Alert, speech normal.   Psychiatric:        Mood and Affect: Mood normal.      ED Results / Procedures / Treatments   Labs (all labs ordered are listed, but only abnormal results are displayed) Results for orders placed or performed during the hospital encounter of 10/28/23  Brain natriuretic peptide   Collection Time: 10/28/23  5:10 PM  Result Value Ref Range   B Natriuretic Peptide 1,065.0 (H) 0.0 - 100.0 pg/mL  Resp panel by RT-PCR (RSV, Flu A&B, Covid) Anterior Nasal Swab   Collection Time: 10/28/23  5:28 PM   Specimen: Anterior Nasal Swab  Result Value Ref Range  SARS Coronavirus 2 by RT PCR NEGATIVE NEGATIVE   Influenza A by PCR NEGATIVE NEGATIVE   Influenza B by PCR NEGATIVE NEGATIVE   Resp Syncytial Virus by PCR NEGATIVE NEGATIVE  I-Stat CG4 Lactic Acid   Collection Time: 10/28/23  6:11 PM  Result Value Ref Range   Lactic Acid, Venous 8.0 (HH) 0.5 - 1.9 mmol/L   Comment NOTIFIED PHYSICIAN   I-Stat CG4 Lactic Acid   Collection Time: 10/28/23  8:13 PM  Result Value Ref Range   Lactic Acid, Venous 7.0 (HH) 0.5 - 1.9 mmol/L   Comment NOTIFIED PHYSICIAN    *Note: Due to a large number of results and/or encounters for the requested time period, some results have not been displayed. A complete set of results can be found in Results Review.   DG Chest Port 1 View Result Date: 10/28/2023 CLINICAL DATA:  Shortness of breath EXAM: PORTABLE CHEST 1 VIEW COMPARISON:  Chest x-ray 10/19/2023 a FINDINGS: The heart is enlarged, unchanged. Sternotomy wires and prosthetic heart valve are again seen. There is some mild patchy opacities in the left lateral lung. There is no pleural effusion or pneumothorax. No acute fractures are seen. IMPRESSION: 1. Mild patchy opacities in the left lateral lung, which may represent atelectasis or infection. 2. Stable cardiomegaly. Electronically Signed   By: Tyron Gallon M.D.   On: 10/28/2023 18:31   DG CHEST PORT 1 VIEW Result Date: 10/19/2023 EXAM: 1 VIEW XRAY OF THE CHEST 10/19/2023 01:17:00 PM COMPARISON: None available.  CLINICAL HISTORY: Encounter for central line placement. FINDINGS: LUNGS AND PLEURA: Mildly worsened bibasilar atelectasis. Small right pleural effusion, slightly increased. Mild pulmonary edema, slightly worsened. No pneumothorax. HEART AND MEDIASTINUM: Moderate cardiomegaly, stable. Mitral annuloplasty being in place. BONES AND SOFT TISSUES: Intact sternotomy wires. Cervical clips overlie the left axilla. No acute osseous abnormality. LINES AND TUBES: Right internal jugular central venous catheter terminates in the lower third of the SVC. IMPRESSION: 1. Right internal jugular central venous catheter terminates in the lower third of the SVC. No pneumothorax. 2. Moderate cardiomegaly, stable. Mild pumonary edema, slightly worsened. 3. Small right pleural effusion, slightly increased. 4. Mildly worsened bibasilar atelectasis. Electronically signed by: Karlyn Overman MD 10/19/2023 02:35 PM EDT RP Workstation: [id number]   US  RENAL Result Date: 10/19/2023 CLINICAL DATA:  Acute renal failure EXAM: RENAL / URINARY TRACT ULTRASOUND COMPLETE COMPARISON:  Ultrasound abdomen complete 08/19/2023 FINDINGS: Right Kidney: Renal measurements: 10.5 x 5.2 x 4.7 cm = volume: 134 mL. Mild increased echogenicity of the renal cortex without significant thinning. No mass or hydronephrosis visualized. Left Kidney: Renal measurements: 10.0 x 5.8 x 5.5 cm = volume: 165 mL. Mild increased echogenicity of the renal cortex without significant thinning. No mass or hydronephrosis visualized. Bladder: Appears normal for degree of bladder distention. Other: None. IMPRESSION: Mild increased echogenicity of the renal cortex without significant thinning suggestive of chronic medical renal disease. Electronically Signed   By: Elester Grim M.D.   On: 10/19/2023 09:59   DG Chest 2 View Result Date: 10/18/2023 CLINICAL DATA:  Shortness of breath and neck pain. Hypoxia on arrival. EXAM: CHEST - 2 VIEW COMPARISON:  AP Lat chest series 09/29/23, 09/19/23.  FINDINGS: Moderate to severe cardiomegaly again noted with MVR. Intact sternotomy sutures. There are no findings of acute CHF. There is chronic pleural-parenchymal disease left apex, left mid field. No new infiltrate is seen. There is no substantial pleural effusion. The mediastinum is normally outlined.  Left axillary surgical clips. There is thoracic kyphosis and  degenerative change with no new osseous findings. IMPRESSION: 1. No evidence of acute chest disease. 2. Cardiomegaly with chronic pleural-parenchymal disease left apex, left mid field. Electronically Signed   By: Denman Fischer M.D.   On: 10/18/2023 22:17   VAS US  UPPER EXTREMITY VENOUS DUPLEX Result Date: 10/04/2023 UPPER VENOUS STUDY  Patient Name:  HAE JEWETT  Date of Exam:   10/04/2023 Medical Rec #: [id number]      Accession #:    [id number] Date of Birth: [birth year]       Patient Gender: F Patient Age:   66 years Exam Location:  [facility] Procedure:      VAS US  UPPER EXTREMITY VENOUS DUPLEX Referring Phys: JULIE WILLIAMS --------------------------------------------------------------------------------  Indications: Swelling, and History of breast cancer with dissection of left lymph node. Other Indications: Recent right IJV DVT found 08/31/2023. Comparison Study: No prior LUEV study on file. Performing Technologist: Carleene Chase RVS  Examination Guidelines: A complete evaluation includes B-mode imaging, spectral Doppler, color Doppler, and power Doppler as needed of all accessible portions of each vessel. Bilateral testing is considered an integral part of a complete examination. Limited examinations for reoccurring indications may be performed as noted.  Right Findings: +----------+------------+---------+-----------+----------+-------+ RIGHT     CompressiblePhasicitySpontaneousPropertiesSummary +----------+------------+---------+-----------+----------+-------+ Subclavian               Yes       Yes                       +----------+------------+---------+-----------+----------+-------+  Left Findings: +----------+------------+---------+-----------+----------+-------+ LEFT      CompressiblePhasicitySpontaneousPropertiesSummary +----------+------------+---------+-----------+----------+-------+ IJV           Full       Yes       Yes                      +----------+------------+---------+-----------+----------+-------+ Subclavian               Yes       Yes                      +----------+------------+---------+-----------+----------+-------+ Axillary                 Yes       Yes                      +----------+------------+---------+-----------+----------+-------+ Brachial      Full                                          +----------+------------+---------+-----------+----------+-------+ Radial        Full                                          +----------+------------+---------+-----------+----------+-------+ Ulnar         Full                                          +----------+------------+---------+-----------+----------+-------+ Cephalic      None                                          +----------+------------+---------+-----------+----------+-------+  Basilic       Full                                          +----------+------------+---------+-----------+----------+-------+  Summary:  Right: No evidence of superficial vein thrombosis in the upper extremity.  Left: No evidence of deep vein thrombosis in the upper extremity. Findings consistent with acute superficial vein thrombosis involving the left cephalic vein.  *See table(s) above for measurements and observations.  Diagnosing physician: Jimmye Moulds MD Electronically signed by Jimmye Moulds MD on 10/04/2023 at 4:49:29 PM.    Final    IR Fluoro Guide CV Line Right Result Date: 10/01/2023 INDICATION: Heart failure and poor venous access. Patient needs central venous access while in the  hospital. EXAM: 1. Right upper extremity venogram. 2. Failed placement of right upper extremity PICC line. 3. Placement of non tunneled central venous catheter using ultrasound and fluoroscopic guidance Physician: Olive Better. Julietta Ogren, MD FLUOROSCOPY TIME:  Radiation Exposure Index (as provided by the fluoroscopic device): 39.8 mGy Kerma MEDICATIONS: 1% lidocaine  for local anesthetic ANESTHESIA/SEDATION: None PROCEDURE: PICC line placement was initially requested. The procedure was explained to the patient. The risks and benefits of the procedure were discussed and the patient's questions were addressed. Informed consent was obtained from the patient. Left upper extremity was not evaluated due to history of left mastectomy. Patent right brachial veins were identified with ultrasound and ultrasound images were saved for documentation. Right upper arm was prepped and draped in sterile fashion. Maximal barrier sterile technique was utilized including caps, mask, sterile gowns, sterile gloves, sterile drape, hand hygiene and skin antiseptic. Skin was anesthetized with 1% lidocaine . Using ultrasound guidance, a 21 gauge needle was directed into a brachial vein and wire was advanced into the right axillary region. Peel-away sheath was placed. Initially, wire would not advance centrally. Venogram images demonstrated complex venous drainage in the right upper extremity. A wire was advanced centrally. However, PICC catheter could not be advanced centrally. Eventually, this access was completely removed. Informed consent was obtained for placement of a non tunneled central venous catheter. The patient was placed supine on the interventional table. Ultrasound confirmed a patent right internal jugularvein. Ultrasound images were obtained for documentation. The right neck was prepped and draped in a sterile fashion. Maximal barrier sterile technique was utilized including caps, mask, sterile gowns, sterile gloves, sterile drape, hand  hygiene and skin antiseptic. The right neck was anesthetized with 1% lidocaine . A small incision was made with #11 blade scalpel. Using ultrasound guidance, needle was directed into the right internal jugular vein. Wire was advanced centrally. The tract was dilated to accommodate a 7 French triple-lumen central venous catheter. Catheter tip was placed at the junction of the superior vena cava and right atrium. All 3 lumens aspirated and flushed well. Lumens were flushed with saline. Central line was sutured to skin. Fluoroscopic and ultrasound images were taken and saved for documentation. FINDINGS: 1. Complex venous drainage in the right upper extremity. Tortuous veins. PICC line could not be placed in the right upper extremity. 2. Right jugular central venous catheter was placed. Catheter tip at the superior cavoatrial junction. COMPLICATIONS: None IMPRESSION: 1. Failed attempt to place a right upper extremity PICC line. 2. Successful placement of a right jugular non tunneled central venous catheter using ultrasound and fluoroscopic guidance. Electronically Signed   By: Elene Griffes M.D.   On:  10/01/2023 17:44   IR US  Guide Vasc Access Right Result Date: 10/01/2023 INDICATION: Heart failure and poor venous access. Patient needs central venous access while in the hospital. EXAM: 1. Right upper extremity venogram. 2. Failed placement of right upper extremity PICC line. 3. Placement of non tunneled central venous catheter using ultrasound and fluoroscopic guidance Physician: Olive Better. Julietta Ogren, MD FLUOROSCOPY TIME:  Radiation Exposure Index (as provided by the fluoroscopic device): 39.8 mGy Kerma MEDICATIONS: 1% lidocaine  for local anesthetic ANESTHESIA/SEDATION: None PROCEDURE: PICC line placement was initially requested. The procedure was explained to the patient. The risks and benefits of the procedure were discussed and the patient's questions were addressed. Informed consent was obtained from the patient. Left upper  extremity was not evaluated due to history of left mastectomy. Patent right brachial veins were identified with ultrasound and ultrasound images were saved for documentation. Right upper arm was prepped and draped in sterile fashion. Maximal barrier sterile technique was utilized including caps, mask, sterile gowns, sterile gloves, sterile drape, hand hygiene and skin antiseptic. Skin was anesthetized with 1% lidocaine . Using ultrasound guidance, a 21 gauge needle was directed into a brachial vein and wire was advanced into the right axillary region. Peel-away sheath was placed. Initially, wire would not advance centrally. Venogram images demonstrated complex venous drainage in the right upper extremity. A wire was advanced centrally. However, PICC catheter could not be advanced centrally. Eventually, this access was completely removed. Informed consent was obtained for placement of a non tunneled central venous catheter. The patient was placed supine on the interventional table. Ultrasound confirmed a patent right internal jugularvein. Ultrasound images were obtained for documentation. The right neck was prepped and draped in a sterile fashion. Maximal barrier sterile technique was utilized including caps, mask, sterile gowns, sterile gloves, sterile drape, hand hygiene and skin antiseptic. The right neck was anesthetized with 1% lidocaine . A small incision was made with #11 blade scalpel. Using ultrasound guidance, needle was directed into the right internal jugular vein. Wire was advanced centrally. The tract was dilated to accommodate a 7 French triple-lumen central venous catheter. Catheter tip was placed at the junction of the superior vena cava and right atrium. All 3 lumens aspirated and flushed well. Lumens were flushed with saline. Central line was sutured to skin. Fluoroscopic and ultrasound images were taken and saved for documentation. FINDINGS: 1. Complex venous drainage in the right upper extremity.  Tortuous veins. PICC line could not be placed in the right upper extremity. 2. Right jugular central venous catheter was placed. Catheter tip at the superior cavoatrial junction. COMPLICATIONS: None IMPRESSION: 1. Failed attempt to place a right upper extremity PICC line. 2. Successful placement of a right jugular non tunneled central venous catheter using ultrasound and fluoroscopic guidance. Electronically Signed   By: Elene Griffes M.D.   On: 10/01/2023 17:44   IR PICC PLACEMENT RIGHT >5 YRS INC IMG GUIDE Result Date: 10/01/2023 INDICATION: Heart failure and poor venous access. Patient needs central venous access while in the hospital. EXAM: 1. Right upper extremity venogram. 2. Failed placement of right upper extremity PICC line. 3. Placement of non tunneled central venous catheter using ultrasound and fluoroscopic guidance Physician: Olive Better. Julietta Ogren, MD FLUOROSCOPY TIME:  Radiation Exposure Index (as provided by the fluoroscopic device): 39.8 mGy Kerma MEDICATIONS: 1% lidocaine  for local anesthetic ANESTHESIA/SEDATION: None PROCEDURE: PICC line placement was initially requested. The procedure was explained to the patient. The risks and benefits of the procedure were discussed and the patient's questions were  addressed. Informed consent was obtained from the patient. Left upper extremity was not evaluated due to history of left mastectomy. Patent right brachial veins were identified with ultrasound and ultrasound images were saved for documentation. Right upper arm was prepped and draped in sterile fashion. Maximal barrier sterile technique was utilized including caps, mask, sterile gowns, sterile gloves, sterile drape, hand hygiene and skin antiseptic. Skin was anesthetized with 1% lidocaine . Using ultrasound guidance, a 21 gauge needle was directed into a brachial vein and wire was advanced into the right axillary region. Peel-away sheath was placed. Initially, wire would not advance centrally. Venogram images  demonstrated complex venous drainage in the right upper extremity. A wire was advanced centrally. However, PICC catheter could not be advanced centrally. Eventually, this access was completely removed. Informed consent was obtained for placement of a non tunneled central venous catheter. The patient was placed supine on the interventional table. Ultrasound confirmed a patent right internal jugularvein. Ultrasound images were obtained for documentation. The right neck was prepped and draped in a sterile fashion. Maximal barrier sterile technique was utilized including caps, mask, sterile gowns, sterile gloves, sterile drape, hand hygiene and skin antiseptic. The right neck was anesthetized with 1% lidocaine . A small incision was made with #11 blade scalpel. Using ultrasound guidance, needle was directed into the right internal jugular vein. Wire was advanced centrally. The tract was dilated to accommodate a 7 French triple-lumen central venous catheter. Catheter tip was placed at the junction of the superior vena cava and right atrium. All 3 lumens aspirated and flushed well. Lumens were flushed with saline. Central line was sutured to skin. Fluoroscopic and ultrasound images were taken and saved for documentation. FINDINGS: 1. Complex venous drainage in the right upper extremity. Tortuous veins. PICC line could not be placed in the right upper extremity. 2. Right jugular central venous catheter was placed. Catheter tip at the superior cavoatrial junction. COMPLICATIONS: None IMPRESSION: 1. Failed attempt to place a right upper extremity PICC line. 2. Successful placement of a right jugular non tunneled central venous catheter using ultrasound and fluoroscopic guidance. Electronically Signed   By: Elene Griffes M.D.   On: 10/01/2023 17:44   US  SOFT TISSUE HEAD & NECK (NON-THYROID ) Result Date: 10/01/2023 CLINICAL DATA:  Neck swelling EXAM: ULTRASOUND OF HEAD/NECK SOFT TISSUES TECHNIQUE: Ultrasound examination of the  head and neck soft tissues was performed in the area of clinical concern. COMPARISON:  None available FINDINGS: Sonographic evaluation of the parotid glands demonstrate no significant abnormality. No enlarged neck lymph nodes are identified. IMPRESSION: No significant sonographic abnormalities of the parotid glands. Electronically Signed   By: Elester Grim M.D.   On: 10/01/2023 17:08   US  THYROID  Result Date: 10/01/2023 CLINICAL DATA:  Neck swelling.  Elevated TSH. EXAM: THYROID  ULTRASOUND TECHNIQUE: Ultrasound examination of the thyroid  gland and adjacent soft tissues was performed. COMPARISON:  None available. FINDINGS: Parenchymal Echotexture: Mildly heterogenous Isthmus: 0.6 cm Right lobe: 3.5 x 2.5 x 1.7 cm Left lobe: 4.5 x 2.2 x 1.8 cm _________________________________________________________ Estimated total number of nodules >/= 1 cm: 1 Number of spongiform nodules >/=  2 cm not described below (TR1): 0 Number of mixed cystic and solid nodules >/= 1.5 cm not described below (TR2): 0 _________________________________________________________ Nodule # 1: Location: Left; superior Maximum size: 1.4 cm; Other 2 dimensions: 1.2 x 1.2 cm Composition: solid/almost completely solid (2) Echogenicity: isoechoic (1) Shape: not taller-than-wide (0) Margins: ill-defined (0) Echogenic foci: none (0) ACR TI-RADS total points: 3. ACR TI-RADS  risk category: TR3 (3 points). ACR TI-RADS recommendations: Given size (<1.4 cm) and appearance, this nodule does NOT meet TI-RADS criteria for biopsy or dedicated follow-up. _________________________________________________________ Additional subcentimeter thyroid  nodules do not meet criteria for imaging follow-up. IMPRESSION: Bilateral thyroid  nodules measuring up to 1.4 cm diameter criteria for FNA or imaging follow-up. The above is in keeping with the ACR TI-RADS recommendations - J Am Coll Radiol 2017;14:587-595. Electronically Signed   By: Elester Grim M.D.   On: 10/01/2023 17:07    US  EKG SITE RITE Result Date: 09/30/2023 If Site Rite image not attached, placement could not be confirmed due to current cardiac rhythm.  VAS US  LOWER EXTREMITY VENOUS (DVT) Result Date: 09/30/2023  Lower Venous DVT Study Patient Name:  AVINA COLDREN  Date of Exam:   09/30/2023 Medical Rec #: [id number]      Accession #:    [id number] Date of Birth: March 24, 1951       Patient Gender: F Patient Age:   61 years Exam Location:  [facility] Procedure:      VAS US  LOWER EXTREMITY VENOUS (DVT) Referring Phys: JULIE WILLIAMS --------------------------------------------------------------------------------  Indications: Pain.  Risk Factors: Cancer Breast DVT Hx Surgery EGD 08/24/23. Anticoagulation: Warfarin. Limitations: Poor ultrasound/tissue interface. Comparison Study: No significant changes see since previous exam 02/05/22. Performing Technologist: Estanislao Heimlich  Examination Guidelines: A complete evaluation includes B-mode imaging, spectral Doppler, color Doppler, and power Doppler as needed of all accessible portions of each vessel. Bilateral testing is considered an integral part of a complete examination. Limited examinations for reoccurring indications may be performed as noted. The reflux portion of the exam is performed with the patient in reverse Trendelenburg.  +-----+---------------+---------+-----------+----------+--------------+ RIGHTCompressibilityPhasicitySpontaneityPropertiesThrombus Aging +-----+---------------+---------+-----------+----------+--------------+ CFV  Full           Yes      Yes                                 +-----+---------------+---------+-----------+----------+--------------+   +---------+---------------+---------+-----------+----------+---------------+ LEFT     CompressibilityPhasicitySpontaneityPropertiesThrombus Aging  +---------+---------------+---------+-----------+----------+---------------+ CFV      Full           Yes      Yes                                   +---------+---------------+---------+-----------+----------+---------------+ SFJ      Full                                                         +---------+---------------+---------+-----------+----------+---------------+ FV Prox  Full                                                         +---------+---------------+---------+-----------+----------+---------------+ FV Mid   Full                    Yes                                  +---------+---------------+---------+-----------+----------+---------------+  FV Distal                        Yes                  Patent by color +---------+---------------+---------+-----------+----------+---------------+ PFV                              Yes                  Patent by color +---------+---------------+---------+-----------+----------+---------------+ POP      Full           Yes      Yes                                  +---------+---------------+---------+-----------+----------+---------------+ PTV      Full                    Yes                                  +---------+---------------+---------+-----------+----------+---------------+ PERO     Full                    Yes                                  +---------+---------------+---------+-----------+----------+---------------+     Summary: RIGHT: - No evidence of common femoral vein obstruction.   LEFT: - There is no evidence of deep vein thrombosis in the lower extremity.  - No cystic structure found in the popliteal fossa.  *See table(s) above for measurements and observations. Electronically signed by Delaney Fearing on 09/30/2023 at 3:05:48 PM.    Final    DG Chest 2 View Result Date: 09/29/2023 CLINICAL DATA:  Edema, short of breath EXAM: CHEST - 2 VIEW COMPARISON:  None Available. FINDINGS: Sternotomy wires overlies stable enlarged cardiac silhouette. There is mild patchy airspace disease. There is pleural calcifications in the LEFT  upper lobe. Low lung volumes. IMPRESSION: Cardiomegaly and mild pulmonary edema pattern. Pleural calcifications. Electronically Signed   By: Deboraha Fallow M.D.   On: 09/29/2023 21:32     EKG None  Radiology DG Chest [facility] 1 View Result Date: 10/28/2023 CLINICAL DATA:  Shortness of breath EXAM: PORTABLE CHEST 1 VIEW COMPARISON:  Chest x-ray 10/19/2023 a FINDINGS: The heart is enlarged, unchanged. Sternotomy wires and prosthetic heart valve are again seen. There is some mild patchy opacities in the left lateral lung. There is no pleural effusion or pneumothorax. No acute fractures are seen. IMPRESSION: 1. Mild patchy opacities in the left lateral lung, which may represent atelectasis or infection. 2. Stable cardiomegaly. Electronically Signed   By: Tyron Gallon M.D.   On: 10/28/2023 18:31    Procedures Procedures    Medications Ordered in ED Medications  HYDROcodone -acetaminophen  (NORCO/VICODIN) 5-325 MG per tablet 1 tablet (has no administration in time range)  Warfarin - Pharmacist Dosing Inpatient (has no administration in time range)  methylPREDNISolone  sodium succinate (SOLU-MEDROL ) 125 mg/2 mL injection 125 mg (125 mg Intravenous Given 10/28/23 1752)  albuterol  (PROVENTIL ) (2.5 MG/3ML) 0.083% nebulizer solution 5 mg (5 mg Nebulization Given 10/28/23 1752)  ipratropium (ATROVENT ) nebulizer solution 0.5 mg (  0.5 mg Nebulization Given 10/28/23 1752)  cefTRIAXone  (ROCEPHIN ) 1 g in sodium chloride  0.9 % 100 mL IVPB (0 g Intravenous Stopped 10/28/23 2006)  ondansetron  (ZOFRAN ) injection 4 mg (4 mg Intravenous Given 10/28/23 1857)    ED Course/ Medical Decision Making/ A&P                                 Medical Decision Making Problems Addressed: Acute and chronic respiratory failure with hypoxia [facility]): acute illness or injury with systemic symptoms that poses a threat to life or bodily functions Acute on chronic combined systolic and diastolic CHF (congestive heart failure) (HCC): acute  illness or injury with systemic symptoms that poses a threat to life or bodily functions AKI (acute kidney injury) (HCC): acute illness or injury with systemic symptoms Chronic anemia: chronic illness or injury Elevated lactic acid level: acute illness or injury that poses a threat to life or bodily functions Productive cough: acute illness or injury Shortness of breath: acute illness or injury with systemic symptoms that poses a threat to life or bodily functions Stage 3b chronic kidney disease (HCC): chronic illness or injury with exacerbation, progression, or side effects of treatment that poses a threat to life or bodily functions Warfarin-induced coagulopathy (HCC): acute illness or injury Wheezing: acute illness or injury with systemic symptoms  Amount and/or Complexity of Data Reviewed External Data Reviewed: labs, radiology and notes. Labs: ordered. Decision-making details documented in ED Course. Radiology: ordered and independent interpretation performed. Decision-making details documented in ED Course. ECG/medicine tests: ordered and independent interpretation performed. Decision-making details documented in ED Course. Discussion of management or test interpretation with external provider(s): medicine  Risk Prescription drug management. Decision regarding hospitalization.   Iv ns. Continuous pulse ox and cardiac monitoring. Labs ordered/sent. Imaging ordered.   Differential diagnosis includes  . Dispo decision including potential need for admission considered - will get labs and imaging and reassess.   Reviewed nursing notes and prior charts for additional history. External reports reviewed. Additional history from:  Cardiac monitor: sinus rhythm, rate 55.  Labs reviewed/interpreted by me - lactate high. From earlier today, aki on ckd, inr 6. Cbc cw prior.   Xrays reviewed/interpreted by me - +cm + infil.   Solumedrol Iv. Albuterol  and atrovent  neb.   Recheck wheezing  improved.   Given v high lactate, prod cough, chest congestion, will give iv abx.   On recheck pt, not clear why lactate so high. Bp is normal. Not tachycardic, afebrile. O2 sats 99%. Pt is mildly tremulous - pt indicates that is how she gets whenever she isnt feeling well. No gtc seizure activity.  Denies pain. No headache. No neck pain/stiffness. No chest pain or discomfort. No abd  pain or tenderness. No extremity pain/swelling.     Medicine consulted for admission. Discussed pt and labs - will admit.  CRITICAL CARE  RE: acute on chronic respiratory failure with hypoxia, copd, acute on chr chf, afib, mech valv w coagulpathy on coumadin , elevated lactate Performed by: Zayra Devito E Kanyla Omeara Total critical care time: 90 minutes Critical care time was exclusive of separately billable procedures and treating other patients. Critical care was necessary to treat or prevent imminent or life-threatening deterioration. Critical care was time spent personally by me on the following activities: development of treatment plan with patient and/or surrogate as well as nursing, discussions with consultants, evaluation of patient's response to treatment, examination of patient, obtaining history from  patient or surrogate, ordering and performing treatments and interventions, ordering and review of laboratory studies, ordering and review of radiographic studies, pulse oximetry and re-evaluation of patient's condition.   Note, no fevers. Lactate is high. ?possible pna vs acute/chronic heart failure. Patient is clear, she wants to do everything possible to avoid intubation/dni - as such, will give small bolus as opposed to larger ivf resus.  Subsequent additional labs reviewed, bnp is high. Medicine/admitting team to follow closely, reassess status.         Final Clinical Impression(s) / ED Diagnoses Final diagnoses:  Shortness of breath  Acute and chronic respiratory failure with hypoxia (HCC)  Acute on chronic  combined systolic and diastolic CHF (congestive heart failure) (HCC)  Wheezing  Productive cough  Elevated lactic acid level  AKI (acute kidney injury) (HCC)  Stage 3b chronic kidney disease (HCC)  Chronic anemia  Warfarin-induced coagulopathy (HCC)    Rx / DC Orders ED Discharge Orders     None         Guadalupe Lee, MD 10/28/23 2029

## 2023-10-28 NOTE — H&P (Signed)
 Date: 10/29/2023               Patient Name:  Kim Anthony [name]: [id number]  DOB: March 04, 1951 Age / Sex: 73 y.o., female   PCP: [name], MD         Medical Service: Internal Medicine Teaching Service         Attending Physician: Dr. Priscella Brooms, DO      First Contact: Dr. [name], MD Pager [phone]    Second Contact: Dr. Jearldine Mina, DO          After Hours (After 5p/  First Contact Pager: [phone]  weekends / holidays): Second Contact Pager: [phone]   SUBJECTIVE   Chief Complaint: Shortness of breath   History of Present Illness: Kim Anthony is a 73 year old female with a past medical history of combined systolic and diastolic heart failure LVEF 55-60%, severe right ventricular systolic dysfunction, mechanical mitral valve replacement on Warfarin, atrial fibrillation, CKD stage 3B, gout, and history of breast cancer who presented to the emergency department due to shortness of breath.   She was discharged from the hospital after being admitted from 04/29-05/06 due to encephalopathy from uremia from an AKI superimposed on CKD stage 3B requiring CRRT. Additionally, there was concern for sialadenitis treated with 5 days of Zosyn .   She presented to the [facility] today for a hospitalization follow up where laboratory results showed an increase in serum creatine from 1.46 on the day of discharge to 2.97 today. Additionally, her blood pressure was at the lower side of normotension, recorded at 91/66. She was originally going to be direct admitted for the AKI superimposed on CKD stage 3B; however, she became more short of breath and went to the emergency department.   Patient stated that she started to feel short of breath on Wednesday and started to cough more with a thick yellow mucous. She denies fevers or chills. Patient does report that the orthopnea has returned. She reports gas pain but denies chest pain. Additionally, she started to feel nauseated today and vomited today.  She reports less urine output today. Patient endorses dizziness and feel lightheaded as well today.   We spoke with her daughter who confirmed the timeline of sympotm onset. She also reports left> R periorbital edema and swelling of her extremities.     Review of Systems: A complete ROS was negative except as per HPI.   ED Course: Patient required oxygen  support with a Non-rebreather and weaned to [facility] , BP flucuated from normotensive to hypotensive (86/61) Lactic acid 8.0-->7-->8.9 Cr 2.97-->3.34 K 4.4-->5.6 CO2 20-->17 Magnesium  2.3 INR 6.3 Glucose 34-->111  Past Medical History: Combined systolic and diastolic heart failure LVEF 55-60% Severe right ventricular systolic dysfunction Mechanical mitral valve replacement on Warfarin Atrial fibrillation AKI requiring CRRT  CKD stage 3B Gout History of breast cancer   Meds:  Albuterol  prn Colchicine  0.3mg  daily  Flonase  daily Gabapentin  takes as prn, prescribed as 200mg  TID  Norco 5/325 mg q4 hours prn  Hydroxyzine : not taking, ran out Metoprolol  25mg  BID Potassium 20meq daily Senakot daily  Simvastatin  20mg  Torsemide  40mg  daily  Trelegy 1 puff daily  Warfarin 3mg   Holding from hospitalization: Farxiga  10 mg Allopurinol  50mg   Losartan  25mg   Allergies: Allergies as of 10/28/2023 - Review Complete 10/28/2023  Allergen Reaction Noted   Lisinopril  Swelling 01/07/2021   Acetaminophen -codeine Itching 07/10/2015   Propoxyphene Itching and Other (See Comments) 01/18/2019   Tape Rash and Other (See Comments) 03/09/2019  Tramadol  Itching, Nausea And Vomiting, and Rash 07/10/2015    Past Surgical History:  Procedure Laterality Date   APPLICATION OF A-CELL OF CHEST/ABDOMEN Left 01/27/2019   Procedure: APPLICATION OF A-CELL OF CHEST;  Surgeon: Thornell Flirt, DO;  Location: [facility] OR;  Service: Plastics;  Laterality: Left;   APPLICATION OF WOUND VAC N/A 01/27/2019   Procedure: APPLICATION OF WOUND VAC;  Surgeon:  Thornell Flirt, DO;  Location: [facility] OR;  Service: Plastics;  Laterality: N/A;   BREAST SURGERY Left 2001   for CA   CARDIOVERSION N/A 02/26/2022   Procedure: CARDIOVERSION;  Surgeon: Wendie Hamburg, MD;  Location: [facility] ENDOSCOPY;  Service: Cardiovascular;  Laterality: N/A;   COLONOSCOPY WITH PROPOFOL  N/A 06/07/2018   Procedure: COLONOSCOPY WITH PROPOFOL ;  Surgeon: Baldo Bonds, MD;  Location: [facility] ENDOSCOPY;  Service: Endoscopy;  Laterality: N/A;   DEBRIDEMENT AND CLOSURE WOUND Left 12/28/2018   Procedure: Debridement And Closure Wound;  Surgeon: Oza Blumenthal, MD;  Location: [facility] OR;  Service: General;  Laterality: Left;   ESOPHAGOGASTRODUODENOSCOPY (EGD) WITH PROPOFOL  N/A 08/24/2023   Procedure: ESOPHAGOGASTRODUODENOSCOPY (EGD) WITH PROPOFOL ;  Surgeon: Renaye Carp, DO;  Location: [facility] ENDOSCOPY;  Service: Gastroenterology;  Laterality: N/A;   INCISION AND DRAINAGE OF WOUND Left 01/27/2019   Procedure: IRRIGATION AND DEBRIDEMENT OF CHEST WALL;  Surgeon: Thornell Flirt, DO;  Location: [facility] OR;  Service: Plastics;  Laterality: Left;   INTRAMEDULLARY (IM) NAIL INTERTROCHANTERIC N/A 09/23/2022   Procedure: INTRAMEDULLARY (IM) NAIL INTERTROCHANTERIC;  Surgeon: Wes Hamman, MD;  Location: [facility] OR;  Service: Orthopedics;  Laterality: N/A;   IR FLUORO GUIDE CV LINE RIGHT  10/01/2023   IR US  GUIDE VASC ACCESS RIGHT  10/01/2023   IR VENO/EXT/UNI RIGHT  10/05/2023   KNEE ARTHROSCOPY WITH MEDIAL MENISECTOMY Left 08/06/2021   Procedure: KNEE ARTHROSCOPY WITH PARTIAL MEDIAL MENISECTOMY, DEBRIDEMENT;  Surgeon: Orvan Blanch, MD;  Location: [facility] ORS;  Service: Orthopedics;  Laterality: Left;  1 HR   LATISSIMUS FLAP TO BREAST Left 01/18/2019   Procedure: LEFT LATISSIMUS FLAP TO BREAST;  Surgeon: Thornell Flirt, DO;  Location: [facility] OR;  Service: Plastics;  Laterality: Left;   MASTECTOMY Left    MITRAL VALVE REPLACEMENT     mechanical   POLYPECTOMY  06/07/2018   Procedure: POLYPECTOMY;   Surgeon: Baldo Bonds, MD;  Location: [facility] ENDOSCOPY;  Service: Endoscopy;;   RIGHT HEART CATH N/A 08/20/2023   Procedure: RIGHT HEART CATH;  Surgeon: Mardell Shade, MD;  Location: [facility] INVASIVE CV LAB;  Service: Cardiovascular;  Laterality: N/A;   RIGHT/LEFT HEART CATH AND CORONARY ANGIOGRAPHY N/A 01/09/2021   Procedure: RIGHT/LEFT HEART CATH AND CORONARY ANGIOGRAPHY;  Surgeon: Mardell Shade, MD;  Location: [facility] INVASIVE CV LAB;  Service: Cardiovascular;  Laterality: N/A;   WOUND DEBRIDEMENT Left 12/28/2018   Procedure: EXCISION OF LEFT CHRONIC CHEST WALL WOUND;  Surgeon: Oza Blumenthal, MD;  Location: [facility] OR;  Service: General;  Laterality: Left;    Social:  Lives With:Daughter  Occupation:Retired  Support: Family  PCP: Dr. Valinda Gault  Substances:Denies tobacco use (quit 1.5 months prior), denies alcohol and illicit drug use   Family History:  Family History  Problem Relation Age of Onset   Hypertension Mother    Cancer Father    Hypertension Father    Breast cancer Maternal Aunt 50   Heart attack Other       OBJECTIVE:   Physical Exam: Blood pressure 110/72, pulse (!) 139, temperature 98.4 F (36.9 C), temperature source Oral, resp.  rate 18, height 5\' 5"  (1.651 m), weight 70.4 kg, SpO2 91%.  Constitutional: Ill appearing, sitting in bed HENT: normocephalic atraumatic Cardiovascular: regular rate and rhythm, mechanical valve auscultated, bilateral fingers and toes were cool to touch: rest of her body was warm and appears well perfused, swollen anterior neck bilaterally: difficult to assess JVD  Pulmonary/Chest: Increased work of breathing on 3LNC, lungs clear to auscultation bilaterally. While speaking in sentences, she did desaturate to the Mid 80's  Abdominal: soft, non-tender, non-distended.  Neurological: alert & oriented x 3, participating in conversation, appears at mentation baseline  MSK: no gross abnormalities. 2+ pitting edema bilaterally to the level of the  mid shins  Skin: warm and dry Psych: Normal mood and affect  Labs:    Latest Ref Rng & Units 10/28/2023   10:58 AM 10/26/2023    6:28 AM 10/25/2023    4:21 AM  CBC  WBC 4.0 - 10.5 K/uL 11.4  10.8  9.7   Hemoglobin 12.0 - 15.0 g/dL 8.5  8.3  8.4   Hematocrit 36.0 - 46.0 % 30.5  28.2  28.2   Platelets 150 - 400 K/uL 395  305  295        Latest Ref Rng & Units 10/28/2023    8:06 PM 10/28/2023   10:58 AM 10/26/2023    6:28 AM  CMP  Glucose 70 - 99 mg/dL 53  75  85   BUN 8 - 23 mg/dL 40  39  27   Creatinine 0.44 - 1.00 mg/dL 1.61  0.96  0.45   Sodium 135 - 145 mmol/L 133  135  132   Potassium 3.5 - 5.1 mmol/L 5.6  4.4  3.5   Chloride 98 - 111 mmol/L 97  99  99   CO2 22 - 32 mmol/L 17  20  24    Calcium  8.9 - 10.3 mg/dL 9.4  9.6  9.0   Total Protein 6.5 - 8.1 g/dL  7.3    Total Bilirubin 0.0 - 1.2 mg/dL  1.1    Alkaline Phos 38 - 126 U/L  123    AST 15 - 41 U/L  33    ALT 0 - 44 U/L  23        Imaging: DG Chest Port 1 View Result Date: 10/28/2023 CLINICAL DATA:  Shortness of breath EXAM: PORTABLE CHEST 1 VIEW COMPARISON:  Chest x-ray 10/19/2023 a FINDINGS: The heart is enlarged, unchanged. Sternotomy wires and prosthetic heart valve are again seen. There is some mild patchy opacities in the left lateral lung. There is no pleural effusion or pneumothorax. No acute fractures are seen. IMPRESSION: 1. Mild patchy opacities in the left lateral lung, which may represent atelectasis or infection. 2. Stable cardiomegaly. Electronically Signed   By: Tyron Gallon M.D.   On: 10/28/2023 18:31      EKG: personally reviewed my interpretation is NSR with a prolonged QTC of 635. Prior EKG   ASSESSMENT & PLAN:   Assessment & Plan by Problem: Active Problems:   Essential hypertension   COPD (chronic obstructive pulmonary disease) (HCC)   Chronic anticoagulation   Combined systolic and diastolic congestive heart failure (HCC)   H/O mitral valve replacement with mechanical valve   Chronic respiratory  failure (HCC)   Supratherapeutic INR   AKI (acute kidney injury) (HCC)   Lactic acidosis   High anion gap metabolic acidosis   Kim Anthony is a 73 y.o. person living with a history of of combined systolic  and diastolic heart failure LVEF 55-60%, severe right ventricular systolic dysfunction, mechanical mitral valve replacement on Warfarin, atrial fibrillation, CKD stage 3B, gout, and history of breast cancer who presented with shortness of breath and admitted for AKI superimposed on CKD stage 3B on hospital day 1   Acute kidney injury superimposed on CKD stage 3B Hyperkalemia  Patient presented with an increase in serum creatinine of 2.97 that increased to 3.34. She was discharged form the hospital 2 days prior from uremia encephalopathy from a severe AKI requiring CRRT. Fortunately, patient is not uremic at this time and has normal mentation. I suspect that her severe AKI superimposed on CKD stage 3B is due to cardiorenal syndrome. The hyperkalemia is stable at 5.6 without EKG changes at this time.  Plan: -Will begin diuresis with IV lasix  120mg  -Cardiology consulted, appreciate their recommendations  -Strict ins and outs -Repeat potassium at midnight ordered  -Will hold off on Lokelma , patient is receiving a large IV lasix  dose at this time which should decrease the potassium level  -Nephrology consulted  -Hold Spironolactone  12.5 mg  Acute on Chronic Diastolic Heart Failure  Severe RV systolic Dysfunction She presented with bilateral 2+ lower extremity edema to the level of the mid shin and signs of poor cardiac output including cold fingers/toes to touch, hypotension, lactic acidosis, and a severe AKI. During a prior admission in March 2025, she required Milrinone  support. During the prior hospitalization, her torsemide  was decreased from 80mg  to 40mg  daily.  Cardiology was consulted who recommends 120mg  of IV lasix  for diuresis. This is her 6th admission this past year, she is aware of  her poor cardiac function and severity of heart disease. We dicussed code status and patient wishes to be DNR/DNI.  Plan: -Appreciate cardiology recommendations -Strict ins and outs  -Daily standing weights  -IV lasix  120mg , will assess volume status/ UOP after dose is administered -Hold metoprolol  25mg  BID, Spironolactone  12.5 mg, Farxiga  10mg   -Trend lactic acid levels -Palliative care consulted   Acute on chronic hypoxic respiratory failure 2/2 COPD Exacerbation  Patient presented with hypoxic respiratory failure requiring Non-rebreather and has since remained stable on 3L  [place]. With the increased cough, productive sputum,  and shortness of breath, suspect COPD exacerbation to be the cause of the acute on chronic hypoxic respiratory failure. She does have physical signs of volume overload and an elevated BNP of 1,065 ; however, her lunge were clear to auscultate. I have lower suspicion for left sided heart failure causing respiratory failure at this time with the lack of crackles/rales present on exam.  Additionally, there is mild left lung patchy opacities present that is likely CAP with the elevated pro-calcitonin of 1.30, productive cough, and leukocytosis. Fortunately, COVID, influenza, and RSV is negative.  Plan: -RVP ordered  -Will avoid azithromycin  with the prolonged Qtc of 635, continue rocephin  1g every 24 hours, day 1/5 -Prednisone  40mg  for 4 days, s/p solu-medrol   -Duonebs every 4 hours prn  -Home Trelegy not available, will use alternative Breztri  2 puffs BID  Lactic Acidosis  High Anion Gap metabolic acidosis  Patient presented with a high anion gap metabolic acidosis with an anion gap of 16 at the [facility] which increased to 19. She presented a lactic acid level of 8.0 -->7.0-->8.9. Anion gap and lactic acidosis is most likely due to a combination of the severe AKI and poor perfusion 2/2 suspected low cardiac output.   Plan: -Trend lactic acid level   -VBG to assess for other  causes  Prolonged Qtc  Patient's Qtc is 635 on EKG today, on 05/01 the Qtc was 530. Electrolytes including potassium, magnesium , and calcium  are not low. -Continue telemetry -EKG q8 hours -AVOID Qtc prolonging medications   Paroxsymal Atrial Fibrillation  Mechanical Mitral Valve  Patient is in NSR on exam which is reflected on the EKG. With the signs of low cardiac output at this time, will hold off on Metoprolol  25mg  BID. Her INR is elevated at 6.3. Plan: -Warfarin consult per pharmacy  -Hold Metoprolol  25mg  BID   Hypoglycemia: Patient's glucose was as low as 34 and required an amp of dextrose  50%. Hypoglycemia likely due to poor oral intake reported by both patient and daughter  -Q2hr CBGs until glucose remains normal, then can transition to daily glucose monitoring with BMPs -Diet ordered   Chronic Low back pain  -Continue Home Norco 5/325mg  Q4PRN  -Hold off on gabapentin  with severe AKI   CAD -Continue Simvastatin  20mg    Gout -Hold colchicine  with severe AKI   ADDENDUM  During the admission of the patient, her lactic acidosis continued to persist with the last lactic acid at 8.9. Cardiology will admit her to the cardiac ICU for milrinone  administration. After she is stable for the floors, we will gladly accept her to our service to continue providing care.   Code Discussion: We discussed code status with patient who wishes to be DNR/DNI. Orders for DNR limited placed.   Diet: Normal VTE: Warfarin Code: DNR/DNI  Prior to Admission Living Arrangement: Home, living with daughter Anticipated Discharge Location: Home Barriers to Discharge: Medical treatment of severe AKI  Dispo: Admit patient to Inpatient with expected length of stay greater than 2 midnights.  Signed:   Aurora Lees, DO Internal Medicine Resident PGY-1 10/29/2023, 1:01 AM  Please contact the on call pager at [phone].

## 2023-10-28 NOTE — ED Notes (Signed)
 Pharmacy messaged to send Lasix  120 mg (SEE MAR)

## 2023-10-28 NOTE — Assessment & Plan Note (Signed)
 Blood pressure borderline low. Will hold restarting farxiga  and losartan  until AKI resolves and blood pressure.

## 2023-10-28 NOTE — ED Notes (Signed)
 Unable to obtain o2 sat nor BP

## 2023-10-28 NOTE — ED Notes (Signed)
 Pt resp distress in lobby. Called nurse for room to by pass triage. Pt shaking and unable to catch breath. New onset kidney failure.

## 2023-10-28 NOTE — ED Triage Notes (Signed)
 Pt arrived by POV w/ c/o SOB. Hx: COPD O2 sats @ 88% on 4L. Pt on 2L baseline. Saw primary care Dr. And was supposed to be admitted to the hospital today. Primary MD saw that pt kidney function was progressively getting worse. SOB started x2 hours ago. Pt is coughing up phlegm. A&O x4

## 2023-10-28 NOTE — Progress Notes (Signed)
 PHARMACY - ANTICOAGULATION CONSULT NOTE  Pharmacy Consult for warfarin  Indication: hx of mechanical mitral valve, remote PE   Allergies  Allergen Reactions   Lisinopril  Swelling   Acetaminophen -Codeine Itching   Propoxyphene Itching    Darvocet   Tape Rash    Plastic   Tramadol  Itching, Nausea And Vomiting and Rash    Patient Measurements: Height: 5\' 5"  (165.1 cm) Weight: 70.4 kg (155 lb 3.3 oz) IBW/kg (Calculated) : 57 HEPARIN  DW (KG): 70.4  Vital Signs: Temp: 98.4 F (36.9 C) (05/08 1918) Temp Source: Oral (05/08 1918) BP: 95/60 (05/08 1945) Pulse Rate: 64 (05/08 1945)  Labs: Recent Labs    10/26/23 0628 10/28/23 0932 10/28/23 1058  HGB 8.3*  --  8.5*  HCT 28.2*  --  30.5*  PLT 305  --  395  LABPROT 33.1*  --   --   INR 3.2* 6.3*  --   HEPARINUNFRC 0.32  --   --   CREATININE 1.46*  --  2.97*    Estimated Creatinine Clearance: 16.4 mL/min (A) (by C-G formula based on SCr of 2.97 mg/dL (H)).   Medical History: Past Medical History:  Diagnosis Date   A-fib (HCC) 03/13/2022   Acute delirium 08/30/2023   Acute GI bleeding 08/18/2023   Acute idiopathic gout of multiple sites 09/30/2020   Acute on chronic hypoxic respiratory failure (HCC) 07/30/2023   Arthritis    Asthma    Breast cancer (HCC) 2001   Left Breast Cancer   CHF (congestive heart failure) (HCC)    Closed fracture of right hip (HCC) 09/23/2022   Closed intertrochanteric fracture of right femur, initial encounter (HCC) 09/22/2022   COPD (chronic obstructive pulmonary disease) (HCC)    Coronary artery disease    DVT (deep venous thrombosis) (HCC) 09/02/2023   GERD (gastroesophageal reflux disease)    Headache, common migraine 07/11/2015   History of mitral valve prosthesis 07/31/2023   History of mitral valve replacement with mechanical valve    HLD (hyperlipidemia)    Hyperkalemia 10/08/2022   Hypertension    Hypomagnesemia 10/26/2023   Monoarthritis of wrist 10/18/2019   Osteoarthritis  of left knee 04/24/2021   Pain around eye, bilateral 10/03/2023   Pain in right hand 01/16/2021   Personal history of breast cancer    Left Breast Cancer     Postmenopausal bleeding 04/21/2018   Endometrial biopsy negative 2019 no further workup advised at that time.     Pulmonary embolism (HCC) 2021   Pulmonary hypertension, low resistance (HCC) 08/20/2023   QT prolongation 08/18/2023   RVF (right ventricular failure) (HCC) 07/31/2023   Seronegative inflammatory arthritis 02/17/2021   Vaginal bleeding 04/01/2022   Wrist arthritis 07/23/2023   Assessment: Patient admitted w/ CC of respiratory distress and unable to catch breath. Hx of mechanical mitral valve, INR check this morning in IM clinic with INR of  6.3. Hgb 8.5 and PLTs 395. Taking warfarin 3mg  daily PTA - was instructed to hold today warfarin's dose while in IM clinic.   Goal of Therapy:  INR 2.5-3.5 Monitor platelets by anticoagulation protocol: Yes   Plan:  INR supra-therapeutic, hold today's warfarin dose.  Recheck INR in AM.   Mamie Searles, PharmD, BCCCP  10/28/2023,8:06 PM

## 2023-10-28 NOTE — H&P (Addendum)
 Cardiology Admission History and Physical   Patient ID: Kim Anthony [name]: [id number]; DOB: 1951-01-31   Admission date: 10/28/2023  PCP:  [name], MD   [facility] Providers Cardiologist:  Jules Oar, MD   {  Chief Complaint:  Shortness of breath  History of Present Illness:  Kim Anthony is a 73 y.o. female with with a hx of right sided heart failure,  s/p mechanical mitral valve replacement (on warfarin), CAD (CTO RCA on cath on 12/2020), atrial fibrillation, pulmonary HTN, CKD3b, HTN, HLD, T2DM,  Chronic hypoxic failure 2/2 COPD, DVT/PE, who was admitted for shortness of breath.   Patient with several recent admissions including: 4/9-4/18 for AHRF secondary to HF exacerbation, and most recently on 4/29 - 5/6 due to AI on CKD requiring CRRT with uremic encephalopathy; she was also treated for sialadenitis. (Otherwise has a total of 5 prior admissions in 2025)  Patient initially presented to clinic for a hospital follow-up. She initially reported shortness of breath that started Wednesday with increasing cough with thick yellow mucous. She denies any fevers and chills. She reported her orthopnea and LE swelling had returned while she was at home; denied any chest pain. Had one episode of emesis and reported decreased UOP. She also reported dizziness/lightheadedness. In clinic, BP 91/66. Labs notable for  AKI on CKD with Cr up to 2.97 from a baseline ~1.5.She was initially planned as a direct admission, but developed worsening shortness of breath prompting transfer to the ER.  ER Course Vitals: T 96.60F -> 98.31F, HR 55, BP 123/93 - 89/51, Initially on NRB mask down to 3L via [place] sat 93%.  Initial Labs: WBC 11.4 (chronic 9.7-11.6), Hgb 8.5, RDW 21.3, plt 395, Na 135, K 4.4, CO2 20, Bun 39, Cr 2.97 (Baseline ~1.5), Alb 3.2, AST 33, ALT 23, ALKP 123, INR 6.3, BNP 1065 (baseline ?600). Flu/RSV/COVID negative. Lactate 8-> 7 -> 8.9. Initial trop 30. CXR: 1. Mild patchy  opacities in the left lateral lung, which may represent atelectasis or infection. 2. Stable cardiomegaly. Interventions: albuterol , atrovent , LR 500cc, Methylpred 125mg  x1, zofran  4mg  IV, ceftraixone 1g, lasix  120mg  IV   Patient was initially triaged to hospital medicine for admission, however, given persistently elevated lactate with elevated   Past Medical History:    Past Medical History:  Diagnosis Date   A-fib (HCC) 03/13/2022   Acute delirium 08/30/2023   Acute GI bleeding 08/18/2023   Acute idiopathic gout of multiple sites 09/30/2020   Acute on chronic hypoxic respiratory failure (HCC) 07/30/2023   Arthritis    Asthma    Breast cancer (HCC) 2001   Left Breast Cancer   CHF (congestive heart failure) (HCC)    Closed fracture of right hip (HCC) 09/23/2022   Closed intertrochanteric fracture of right femur, initial encounter (HCC) 09/22/2022   COPD (chronic obstructive pulmonary disease) (HCC)    Coronary artery disease    DVT (deep venous thrombosis) (HCC) 09/02/2023   GERD (gastroesophageal reflux disease)    Headache, common migraine 07/11/2015   History of mitral valve prosthesis 07/31/2023   History of mitral valve replacement with mechanical valve    HLD (hyperlipidemia)    Hyperkalemia 10/08/2022   Hypertension    Hypomagnesemia 10/26/2023   Monoarthritis of wrist 10/18/2019   Osteoarthritis of left knee 04/24/2021   Pain around eye, bilateral 10/03/2023   Pain in right hand 01/16/2021   Personal history of breast cancer    Left Breast Cancer     Postmenopausal  bleeding 04/21/2018   Endometrial biopsy negative 2019 no further workup advised at that time.     Pulmonary embolism (HCC) 2021   Pulmonary hypertension, low resistance (HCC) 08/20/2023   QT prolongation 08/18/2023   RVF (right ventricular failure) (HCC) 07/31/2023   Seronegative inflammatory arthritis 02/17/2021   Vaginal bleeding 04/01/2022   Wrist arthritis 07/23/2023    Past Surgical History:   Procedure Laterality Date   APPLICATION OF A-CELL OF CHEST/ABDOMEN Left 01/27/2019   Procedure: APPLICATION OF A-CELL OF CHEST;  Surgeon: Thornell Flirt, DO;  Location: [facility] OR;  Service: Plastics;  Laterality: Left;   APPLICATION OF WOUND VAC N/A 01/27/2019   Procedure: APPLICATION OF WOUND VAC;  Surgeon: Thornell Flirt, DO;  Location: [facility] OR;  Service: Plastics;  Laterality: N/A;   BREAST SURGERY Left 2001   for CA   CARDIOVERSION N/A 02/26/2022   Procedure: CARDIOVERSION;  Surgeon: Wendie Hamburg, MD;  Location: [facility] ENDOSCOPY;  Service: Cardiovascular;  Laterality: N/A;   COLONOSCOPY WITH PROPOFOL  N/A 06/07/2018   Procedure: COLONOSCOPY WITH PROPOFOL ;  Surgeon: Baldo Bonds, MD;  Location: [facility] ENDOSCOPY;  Service: Endoscopy;  Laterality: N/A;   DEBRIDEMENT AND CLOSURE WOUND Left 12/28/2018   Procedure: Debridement And Closure Wound;  Surgeon: Oza Blumenthal, MD;  Location: [facility] OR;  Service: General;  Laterality: Left;   ESOPHAGOGASTRODUODENOSCOPY (EGD) WITH PROPOFOL  N/A 08/24/2023   Procedure: ESOPHAGOGASTRODUODENOSCOPY (EGD) WITH PROPOFOL ;  Surgeon: Renaye Carp, DO;  Location: [facility] ENDOSCOPY;  Service: Gastroenterology;  Laterality: N/A;   INCISION AND DRAINAGE OF WOUND Left 01/27/2019   Procedure: IRRIGATION AND DEBRIDEMENT OF CHEST WALL;  Surgeon: Thornell Flirt, DO;  Location: [facility] OR;  Service: Plastics;  Laterality: Left;   INTRAMEDULLARY (IM) NAIL INTERTROCHANTERIC N/A 09/23/2022   Procedure: INTRAMEDULLARY (IM) NAIL INTERTROCHANTERIC;  Surgeon: Wes Hamman, MD;  Location: [facility] OR;  Service: Orthopedics;  Laterality: N/A;   IR FLUORO GUIDE CV LINE RIGHT  10/01/2023   IR US  GUIDE VASC ACCESS RIGHT  10/01/2023   IR VENO/EXT/UNI RIGHT  10/05/2023   KNEE ARTHROSCOPY WITH MEDIAL MENISECTOMY Left 08/06/2021   Procedure: KNEE ARTHROSCOPY WITH PARTIAL MEDIAL MENISECTOMY, DEBRIDEMENT;  Surgeon: Orvan Blanch, MD;  Location: [facility] ORS;  Service: Orthopedics;   Laterality: Left;  1 HR   LATISSIMUS FLAP TO BREAST Left 01/18/2019   Procedure: LEFT LATISSIMUS FLAP TO BREAST;  Surgeon: Thornell Flirt, DO;  Location: [facility] OR;  Service: Plastics;  Laterality: Left;   MASTECTOMY Left    MITRAL VALVE REPLACEMENT     mechanical   POLYPECTOMY  06/07/2018   Procedure: POLYPECTOMY;  Surgeon: Baldo Bonds, MD;  Location: [facility] ENDOSCOPY;  Service: Endoscopy;;   RIGHT HEART CATH N/A 08/20/2023   Procedure: RIGHT HEART CATH;  Surgeon: Mardell Shade, MD;  Location: [facility] INVASIVE CV LAB;  Service: Cardiovascular;  Laterality: N/A;   RIGHT/LEFT HEART CATH AND CORONARY ANGIOGRAPHY N/A 01/09/2021   Procedure: RIGHT/LEFT HEART CATH AND CORONARY ANGIOGRAPHY;  Surgeon: Mardell Shade, MD;  Location: [facility] INVASIVE CV LAB;  Service: Cardiovascular;  Laterality: N/A;   WOUND DEBRIDEMENT Left 12/28/2018   Procedure: EXCISION OF LEFT CHRONIC CHEST WALL WOUND;  Surgeon: Oza Blumenthal, MD;  Location: [facility] OR;  Service: General;  Laterality: Left;     Medications Prior to Admission: Prior to Admission medications   Medication Sig Start Date End Date Taking? Authorizing Provider  albuterol  (VENTOLIN  HFA) 108 (90 Base) MCG/ACT inhaler Inhale 1 puff into the lungs every 4 (four) hours as needed  for wheezing or shortness of breath. 09/14/23  Yes Juberg, Christopher, DO  colchicine  0.6 MG tablet Take 0.5 tablets (0.3 mg total) by mouth daily. 10/08/23  Yes Cleven Dallas, DO  fluticasone  (FLONASE ) 50 MCG/ACT nasal spray Place 2 sprays into both nostrils daily. Patient taking differently: Place 1-2 sprays into both nostrils daily as needed for allergies or rhinitis. 05/17/23 05/16/24 Yes [name], MD  Fluticasone -Umeclidin-Vilant (TRELEGY ELLIPTA ) 100-62.5-25 MCG/ACT AEPB Inhale 1 puff into the lungs daily. 09/14/23  Yes Carleen Chary, DO  gabapentin  (NEURONTIN ) 100 MG capsule Take 2 capsules (200 mg total) by mouth 3 (three) times daily. 10/08/23  Yes Cleven Dallas, DO  HYDROcodone -acetaminophen  (NORCO/VICODIN) 5-325 MG tablet Take 1 tablet by mouth every 4 (four) hours as needed for moderate pain (pain score 4-6). Diagnosis- chronic low back pain 10/27/23  Yes Lovorn, Megan, MD  metoprolol  tartrate (LOPRESSOR ) 25 MG tablet Take 1 tablet (25 mg total) by mouth 2 (two) times daily. 10/26/23  Yes Sandie Cross, MD  potassium chloride  (KLOR-CON  M) 10 MEQ tablet Take 2 tablets (20 mEq total) by mouth daily. 09/07/23 04/04/24 Yes Amoako, Prince, MD  senna-docusate (SENOKOT-S) 8.6-50 MG tablet Take 1 tablet by mouth at bedtime as needed for mild constipation. Patient taking differently: Take 1 tablet by mouth every evening. 10/08/23  Yes Cleven Dallas, DO  simvastatin  (ZOCOR ) 20 MG tablet Take 1 tablet (20 mg total) by mouth daily. 12/03/22  Yes Allana Arabia, MD  spironolactone  (ALDACTONE ) 25 MG tablet Take 12.5 mg by mouth daily.   Yes [provider]  torsemide  (DEMADEX ) 20 MG tablet Take 2 tablets (40 mg total) by mouth daily. 10/27/23  Yes [name], MD  Vitamin D , Ergocalciferol , (DRISDOL ) 1.25 MG (50000 UNIT) CAPS capsule Take 1 capsule (50,000 Units total) by mouth every 7 (seven) days. Patient taking differently: Take 50,000 Units by mouth every Saturday. 10/11/23  Yes Cleven Dallas, DO  warfarin (COUMADIN ) 3 MG tablet Take 1 tablet (3 mg total) by mouth daily at 12 noon. Follow up with Dr. Margit Shelling Patient taking differently: Take 0-3 mg by mouth See admin instructions. Take 1.5 mg by mouth in the evening on Sun, NOTHING on Mon/Tues/Wed/Thurs/Fri, and 3 mg on Sat- INR to be re-checked at the doctor's discretion 10/26/23  Yes [name], MD  allopurinol  (ZYLOPRIM ) 100 MG tablet Take 0.5 tablets (50 mg total) by mouth every other day. Patient not taking: Reported on 10/19/2023 10/13/23   Malen Scudder, DO  FARXIGA  10 MG TABS tablet Take 1 tablet by mouth once daily 08/31/23   Amoako, Prince, MD  losartan  (COZAAR ) 25 MG tablet Take 0.5 tablets  (12.5 mg total) by mouth daily. 08/10/23 11/08/23  Elmarie Hacking, FNP     Allergies:    Allergies  Allergen Reactions   Lisinopril  Swelling   Acetaminophen -Codeine Itching   Propoxyphene Itching and Other (See Comments)    Darvocet   Tape Rash and Other (See Comments)    Plastic tape is not tolerated   Tramadol  Itching, Nausea And Vomiting and Rash    Social History:   Social History   Socioeconomic History   Marital status: Single    Spouse name: Not on file   Number of children: Not on file   Years of education: Not on file   Highest education level: Not on file  Occupational History   Not on file  Tobacco Use   Smoking status: Every Day    Current packs/day: 0.10    Average  packs/day: 0.1 packs/day for 30.0 years (3.0 ttl pk-yrs)    Types: Cigarettes   Smokeless tobacco: Never   Tobacco comments:    5 cigs per day  Vaping Use   Vaping status: Never Used  Substance and Sexual Activity   Alcohol use: Yes    Alcohol/week: 3.0 standard drinks of alcohol    Types: 3 Standard drinks or equivalent per week    Comment: beer- ocasional   Drug use: No   Sexual activity: Not Currently    Birth control/protection: None  Other Topics Concern   Not on file  Social History Narrative   ** Merged History Encounter **       Social Drivers of Health   Financial Resource Strain: Low Risk  (12/01/2022)   Overall Financial Resource Strain (CARDIA)    Difficulty of Paying Living Expenses: Not very hard  Food Insecurity: No Food Insecurity (10/19/2023)   Hunger Vital Sign    Worried About Running Out of Food in the Last Year: Never true    Ran Out of Food in the Last Year: Never true  Transportation Needs: Unmet Transportation Needs (10/19/2023)   PRAPARE - Administrator, Civil Service (Medical): Yes    Lack of Transportation (Non-Medical): No  Physical Activity: Inactive (12/01/2022)   Exercise Vital Sign    Days of Exercise per Week: 0 days    Minutes of  Exercise per Session: 10 min  Stress: Stress Concern Present (12/01/2022)   [employer] of Occupational Health - Occupational Stress Questionnaire    Feeling of Stress : To some extent  Social Connections: Socially Isolated (10/19/2023)   Social Connection and Isolation Panel [NHANES]    Frequency of Communication with Friends and Family: More than three times a week    Frequency of Social Gatherings with Friends and Family: More than three times a week    Attends Religious Services: Never    Database administrator or Organizations: No    Attends Banker Meetings: Never    Marital Status: Never married  Intimate Partner Violence: Not At Risk (10/19/2023)   Humiliation, Afraid, Rape, and Kick questionnaire    Fear of Current or Ex-Partner: No    Emotionally Abused: No    Physically Abused: No    Sexually Abused: No    Family History:   The patient's family history includes Breast cancer (age of onset: 71) in her maternal aunt; Cancer in her father; Heart attack in an other family member; Hypertension in her father and mother.    ROS:  Please see the history of present illness.  All other ROS reviewed and negative.     Physical Exam/Data:   Vitals:   10/29/23 0030 10/29/23 0045 10/29/23 0100 10/29/23 0115  BP: 122/61 (!) 124/91 128/70 (!) 116/90  Pulse: 67 68 70 69  Resp: 18 (!) 21 17 (!) 23  Temp:      TempSrc:      SpO2: 99% 100% 97% 96%  Weight:      Height:        Intake/Output Summary (Last 24 hours) at 10/29/2023 0204 Last data filed at 10/28/2023 2006 Gross per 24 hour  Intake 100 ml  Output --  Net 100 ml      10/28/2023    5:12 PM 10/28/2023    9:27 AM 10/26/2023    5:00 AM  Last 3 Weights  Weight (lbs) 155 lb 3.3 oz 155 lb 3.2 oz 167 lb 8.8  oz  Weight (kg) 70.4 kg 70.398 kg 76 kg     Body mass index is 25.83 kg/m.  General:  Well nourished, well developed, in no acute distress HEENT: normal Neck: JVD 8cm Vascular: No carotid bruits; Distal  pulses 2+ bilaterally   Cardiac:  normal S1, S2; RRR; \ Lungs:  clear to auscultation bilaterally, no wheezing, rhonchi or rales  Abd: soft, nontender, no hepatomegaly  Ext: trace-1+ peripheral edema edema Musculoskeletal:  No deformities, BUE and BLE strength normal and equal Skin: warm and dry  Neuro:  no focal abnormalities noted Psych:  Normal affect   Relevant CV Studies: RHC 08/20/23 On NE 5 RA = 22 RV = 48/25 PA = 50/25 (35) PCW = 15 Fick cardiac output/index = 4.6/3.0 TD CO/CI = 4.2/2.7 PVR =  Fick 4.4 TD 4.8  Ao sat = 97% PA sat = 54%, 56% PAPi = 1.1  Laboratory Data:  High Sensitivity Troponin:   Recent Labs  Lab 10/19/23 0115 10/19/23 0325 10/28/23 2006  TROPONINIHS 18* 17 30*      Chemistry Recent Labs  Lab 10/26/23 0628 10/28/23 1058 10/28/23 2006 10/28/23 2030  NA 132* 135 133*  --   K 3.5 4.4 5.6*  --   CL 99 99 97*  --   CO2 24 20* 17*  --   GLUCOSE 85 75 53*  --   BUN 27* 39* 40*  --   CREATININE 1.46* 2.97* 3.34*  --   CALCIUM  9.0 9.6 9.4  --   MG 1.7  --   --  2.3  GFRNONAA 38* 16* 14*  --   ANIONGAP 9 16* 19*  --     Recent Labs  Lab 10/26/23 0628 10/28/23 1058  PROT  --  7.3  ALBUMIN 2.7* 3.2*  AST  --  33  ALT  --  23  ALKPHOS  --  123  BILITOT  --  1.1   Lipids No results for input(s): "CHOL", "TRIG", "HDL", "LABVLDL", "LDLCALC", "CHOLHDL" in the last 168 hours. Hematology Recent Labs  Lab 10/26/23 0628 10/28/23 1058  WBC 10.8* 11.4*  RBC 3.09* 3.19*  HGB 8.3* 8.5*  HCT 28.2* 30.5*  MCV 91.3 95.6  MCH 26.9 26.6  MCHC 29.4* 27.9*  RDW 21.0* 21.3*  PLT 305 395   Thyroid  No results for input(s): "TSH", "FREET4" in the last 168 hours. BNP Recent Labs  Lab 10/28/23 1710  BNP 1,065.0*    DDimer No results for input(s): "DDIMER" in the last 168 hours.   Radiology/Studies:  DG Chest Port 1 View Result Date: 10/28/2023 CLINICAL DATA:  Shortness of breath EXAM: PORTABLE CHEST 1 VIEW COMPARISON:  Chest x-ray  10/19/2023 a FINDINGS: The heart is enlarged, unchanged. Sternotomy wires and prosthetic heart valve are again seen. There is some mild patchy opacities in the left lateral lung. There is no pleural effusion or pneumothorax. No acute fractures are seen. IMPRESSION: 1. Mild patchy opacities in the left lateral lung, which may represent atelectasis or infection. 2. Stable cardiomegaly. Electronically Signed   By: Tyron Gallon M.D.   On: 10/28/2023 18:31     Assessment and Plan: Kim Anthony is a 73 y.o. female  with with a hx of right sided heart failure,  s/p mechanical mitral valve replacement (on warfarin), CAD (CTO RCA on cath on 12/2020), atrial fibrillation, pulmonary HTN, CKD3b, HTN, HLD, T2DM,  Chronic hypoxic failure 2/2 COPD, DVT/PE, who was admitted for a HF exacerbation with known RV dysfucntion.   #  Acute on chronic diastolic CHF with RV dysfunction:  #Severe TR #AKI on CKD Patient with several recent admissions for decompensated Right sided heart failure, most recently discharged on 5/6 presented with AKI on CKD with orthopnea and shortness of breath. BNP 1000 (unclear baseline), Cr 3 from a baseline of 1.5, noted to be mildly volume overloaded, lactate persistently at 8. Suspect that she has a low RV output state, will trial diuresis and starting milrinone  peripherally. Patient previously had a failed attempt at a PICC due to tortuosity, unable to place a central line overnight due to INR 6.  --obtain TTE --trend lactate/vbg q4h --start milrinone  0.25mcg/kg peripherally  --anticipate getting central line once INR <3, may consider RHC --palliative care consulted Diuresis --lasix  120mg  IV --hold home torsemide  80mg  every day --Goal K~4, Mag~2 GDMT --hold home dapagliflozin  10mg  every day --hold home metoprolol  tartrate 25mg  bid --hold home losartan  12.5mg  at bedtime --hold home spironolactone  12.5mg  every day   #Anion gap metabolic acidosis #AKI on CKD  --Diuresis as  above --Nephrology consulted for consideration of CRRT --bicarb amp x 1 --start bicarb in d5 gtt at 50cc/hr (given worsening acidosis, hyperkalemia, hypoglycemia)  #CAD; hx CTO RCA --continue simvastatin  20mg  every day   #Mechanical mitral valve --INR daily, goal 2.5-3.5 --warfarin currently on hold  #Paroxysmal AF/AFL --continue anticoagulation --continue telemetry   #COPD --continue home LAMA/LABA/ICS --continue O2 (Home req: 3L), titrate to O2 sat 88-92%  #Gout --hold home colchicine  0.3mg  every day --hold home allopurinol  50mg  every day    Risk Assessment/Risk Scores:   [place]  Heart Association (NYHA) Functional Class NYHA Class III   Code Status: Do Not Resuscitate (DNR)  Severity of Illness: The appropriate patient status for this patient is INPATIENT. Inpatient status is judged to be reasonable and necessary in order to provide the required intensity of service to ensure the patient's safety. The patient's presenting symptoms, physical exam findings, and initial radiographic and laboratory data in the context of their chronic comorbidities is felt to place them at high risk for further clinical deterioration. Furthermore, it is not anticipated that the patient will be medically stable for discharge from the hospital within 2 midnights of admission.   * I certify that at the point of admission it is my clinical judgment that the patient will require inpatient hospital care spanning beyond 2 midnights from the point of admission due to high intensity of service, high risk for further deterioration and high frequency of surveillance required.*   For questions or updates, please contact [facility] Please consult [email or web address] for contact info under     Signed, Milbert Alice, MD  10/29/2023 2:04 AM

## 2023-10-29 ENCOUNTER — Other Ambulatory Visit: Payer: Self-pay

## 2023-10-29 DIAGNOSIS — E872 Acidosis, unspecified: Secondary | ICD-10-CM | POA: Diagnosis not present

## 2023-10-29 DIAGNOSIS — N179 Acute kidney failure, unspecified: Secondary | ICD-10-CM

## 2023-10-29 DIAGNOSIS — R570 Cardiogenic shock: Secondary | ICD-10-CM | POA: Diagnosis not present

## 2023-10-29 DIAGNOSIS — J9611 Chronic respiratory failure with hypoxia: Secondary | ICD-10-CM

## 2023-10-29 DIAGNOSIS — I5033 Acute on chronic diastolic (congestive) heart failure: Secondary | ICD-10-CM

## 2023-10-29 LAB — BLOOD GAS, VENOUS
Acid-base deficit: 11 mmol/L — ABNORMAL HIGH (ref 0.0–2.0)
Bicarbonate: 18.6 mmol/L — ABNORMAL LOW (ref 20.0–28.0)
Drawn by: 8194
O2 Saturation: 42.3 %
Patient temperature: 36.2
pCO2, Ven: 54 mmHg (ref 44–60)
pH, Ven: 7.14 — CL (ref 7.25–7.43)
pO2, Ven: 32 mmHg (ref 32–45)

## 2023-10-29 LAB — COMPREHENSIVE METABOLIC PANEL WITH GFR
ALT: 26 U/L (ref 0–44)
AST: 55 U/L — ABNORMAL HIGH (ref 15–41)
Albumin: 3 g/dL — ABNORMAL LOW (ref 3.5–5.0)
Alkaline Phosphatase: 122 U/L (ref 38–126)
Anion gap: 20 — ABNORMAL HIGH (ref 5–15)
BUN: 45 mg/dL — ABNORMAL HIGH (ref 8–23)
CO2: 18 mmol/L — ABNORMAL LOW (ref 22–32)
Calcium: 9.4 mg/dL (ref 8.9–10.3)
Chloride: 96 mmol/L — ABNORMAL LOW (ref 98–111)
Creatinine, Ser: 3.87 mg/dL — ABNORMAL HIGH (ref 0.44–1.00)
GFR, Estimated: 12 mL/min — ABNORMAL LOW (ref >60)
Glucose, Bld: 99 mg/dL (ref 70–99)
Potassium: 5.9 mmol/L — ABNORMAL HIGH (ref 3.5–5.1)
Sodium: 134 mmol/L — ABNORMAL LOW (ref 135–145)
Total Bilirubin: 1.3 mg/dL — ABNORMAL HIGH (ref 0.0–1.2)
Total Protein: 7 g/dL (ref 6.5–8.1)

## 2023-10-29 LAB — CBC
HCT: 29.4 % — ABNORMAL LOW (ref 36.0–46.0)
Hemoglobin: 8.2 g/dL — ABNORMAL LOW (ref 12.0–15.0)
MCH: 27.1 pg (ref 26.0–34.0)
MCHC: 27.9 g/dL — ABNORMAL LOW (ref 30.0–36.0)
MCV: 97 fL (ref 80.0–100.0)
Platelets: 429 10*3/uL — ABNORMAL HIGH (ref 150–400)
RBC: 3.03 MIL/uL — ABNORMAL LOW (ref 3.87–5.11)
RDW: 21.1 % — ABNORMAL HIGH (ref 11.5–15.5)
WBC: 8.9 10*3/uL (ref 4.0–10.5)
nRBC: 0.7 % — ABNORMAL HIGH (ref 0.0–0.2)

## 2023-10-29 LAB — RESPIRATORY PANEL BY PCR

## 2023-10-29 LAB — I-STAT VENOUS BLOOD GAS, ED
Acid-base deficit: 13 mmol/L — ABNORMAL HIGH (ref 0.0–2.0)
Bicarbonate: 16.5 mmol/L — ABNORMAL LOW (ref 20.0–28.0)
Calcium, Ion: 1.15 mmol/L (ref 1.15–1.40)
HCT: 30 % — ABNORMAL LOW (ref 36.0–46.0)
Hemoglobin: 10.2 g/dL — ABNORMAL LOW (ref 12.0–15.0)
O2 Saturation: 97 %
Potassium: 5.9 mmol/L — ABNORMAL HIGH (ref 3.5–5.1)
Sodium: 131 mmol/L — ABNORMAL LOW (ref 135–145)
TCO2: 18 mmol/L — ABNORMAL LOW (ref 22–32)
pCO2, Ven: 53.8 mmHg (ref 44–60)
pH, Ven: 7.095 — CL (ref 7.25–7.43)
pO2, Ven: 127 mmHg — ABNORMAL HIGH (ref 32–45)

## 2023-10-29 LAB — CG4 I-STAT (LACTIC ACID): Lactic Acid, Venous: 9.7 mmol/L (ref 0.5–1.9)

## 2023-10-29 LAB — POCT I-STAT 7, (LYTES, BLD GAS, ICA,H+H)
Acid-base deficit: 7 mmol/L — ABNORMAL HIGH (ref 0.0–2.0)
Bicarbonate: 20.9 mmol/L (ref 20.0–28.0)
Calcium, Ion: 1.16 mmol/L (ref 1.15–1.40)
HCT: 26 % — ABNORMAL LOW (ref 36.0–46.0)
Hemoglobin: 8.8 g/dL — ABNORMAL LOW (ref 12.0–15.0)
O2 Saturation: 89 %
Patient temperature: 98.1
Potassium: 5.4 mmol/L — ABNORMAL HIGH (ref 3.5–5.1)
Sodium: 132 mmol/L — ABNORMAL LOW (ref 135–145)
TCO2: 23 mmol/L (ref 22–32)
pCO2 arterial: 56.6 mmHg — ABNORMAL HIGH (ref 32–48)
pH, Arterial: 7.174 — CL (ref 7.35–7.45)
pO2, Arterial: 71 mmHg — ABNORMAL LOW (ref 83–108)

## 2023-10-29 LAB — PHOSPHORUS: Phosphorus: 8 mg/dL — ABNORMAL HIGH (ref 2.5–4.6)

## 2023-10-29 LAB — LACTIC ACID, PLASMA: Lactic Acid, Venous: 8.3 mmol/L (ref 0.5–1.9)

## 2023-10-29 LAB — COOXEMETRY PANEL
Carboxyhemoglobin: 1.6 % — ABNORMAL HIGH (ref 0.5–1.5)
Methemoglobin: <0.7 % (ref 0.0–1.5)
O2 Saturation: 61 %
Total hemoglobin: 8.5 g/dL — ABNORMAL LOW (ref 12.0–16.0)

## 2023-10-29 LAB — GLUCOSE, CAPILLARY
Glucose-Capillary: 89 mg/dL (ref 70–99)
Glucose-Capillary: 129 mg/dL — ABNORMAL HIGH (ref 70–99)
Glucose-Capillary: 143 mg/dL — ABNORMAL HIGH (ref 70–99)
Glucose-Capillary: 158 mg/dL — ABNORMAL HIGH (ref 70–99)

## 2023-10-29 LAB — MRSA NEXT GEN BY PCR, NASAL: MRSA by PCR Next Gen: NOT DETECTED

## 2023-10-29 LAB — CBG MONITORING, ED
Glucose-Capillary: 138 mg/dL — ABNORMAL HIGH (ref 70–99)
Glucose-Capillary: 87 mg/dL (ref 70–99)

## 2023-10-29 LAB — PROTIME-INR
INR: 8.6 (ref 0.8–1.2)
Prothrombin Time: 71.1 s — ABNORMAL HIGH (ref 11.4–15.2)

## 2023-10-29 LAB — MAGNESIUM: Magnesium: 2.4 mg/dL (ref 1.7–2.4)

## 2023-10-29 MED ORDER — ALBUTEROL SULFATE (2.5 MG/3ML) 0.083% IN NEBU: 2.5000 mg | INHALATION_SOLUTION | RESPIRATORY_TRACT | Status: DC | PRN

## 2023-10-29 MED ORDER — REVEFENACIN 175 MCG/3ML IN SOLN
175.0000 ug | Freq: Every day | RESPIRATORY_TRACT | Status: DC
Start: 2023-10-29 — End: 2023-10-30
  Administered 2023-10-29: 175 ug via RESPIRATORY_TRACT
  Filled 2023-10-29: qty 3

## 2023-10-29 MED ORDER — LORAZEPAM 2 MG/ML IJ SOLN
1.0000 mg | INTRAMUSCULAR | Status: DC | PRN
  Administered 2023-10-30 (×2): 1 mg via INTRAVENOUS
  Filled 2023-10-29 (×2): qty 1

## 2023-10-29 MED ORDER — POLYVINYL ALCOHOL 1.4 % OP SOLN: 1.0000 [drp] | Freq: Four times a day (QID) | OPHTHALMIC | Status: DC | PRN

## 2023-10-29 MED ORDER — MILRINONE LACTATE IN DEXTROSE 20-5 MG/100ML-% IV SOLN
0.2500 ug/kg/min | INTRAVENOUS | Status: DC
  Administered 2023-10-29 – 2023-10-30 (×3): 0.25 ug/kg/min via INTRAVENOUS
  Filled 2023-10-29 (×4): qty 100

## 2023-10-29 MED ORDER — LORAZEPAM 2 MG/ML IJ SOLN
INTRAMUSCULAR | Status: AC
  Administered 2023-10-29: 1 mg via INTRAVENOUS
  Filled 2023-10-29: qty 1

## 2023-10-29 MED ORDER — GLYCOPYRROLATE 0.2 MG/ML IJ SOLN: 0.2000 mg | INTRAMUSCULAR | Status: DC | PRN

## 2023-10-29 MED ORDER — SODIUM CHLORIDE 0.45 % IV SOLN
INTRAVENOUS | Status: DC
  Filled 2023-10-29: qty 75

## 2023-10-29 MED ORDER — ARFORMOTEROL TARTRATE 15 MCG/2ML IN NEBU
15.0000 ug | INHALATION_SOLUTION | Freq: Two times a day (BID) | RESPIRATORY_TRACT | Status: DC
Start: 2023-10-29 — End: 2023-10-30
  Administered 2023-10-29: 15 ug via RESPIRATORY_TRACT
  Filled 2023-10-29 (×3): qty 2

## 2023-10-29 MED ORDER — MORPHINE SULFATE (PF) 2 MG/ML IV SOLN
2.0000 mg | INTRAVENOUS | Status: DC | PRN
  Administered 2023-10-29: 2 mg via INTRAVENOUS
  Filled 2023-10-29: qty 1

## 2023-10-29 MED ORDER — GLYCOPYRROLATE 1 MG PO TABS: 1.0000 mg | ORAL_TABLET | ORAL | Status: DC | PRN

## 2023-10-29 MED ORDER — BUDESONIDE 0.5 MG/2ML IN SUSP
0.5000 mg | Freq: Two times a day (BID) | RESPIRATORY_TRACT | Status: DC
  Administered 2023-10-29: 0.5 mg via RESPIRATORY_TRACT
  Filled 2023-10-29 (×3): qty 2

## 2023-10-29 MED ORDER — SODIUM BICARBONATE 8.4 % IV SOLN
50.0000 meq | Freq: Once | INTRAVENOUS | Status: AC
  Administered 2023-10-29: 50 meq via INTRAVENOUS
  Filled 2023-10-29: qty 50

## 2023-10-29 MED ORDER — CHLORHEXIDINE GLUCONATE CLOTH 2 % EX PADS
6.0000 | MEDICATED_PAD | Freq: Every day | CUTANEOUS | Status: DC
  Administered 2023-10-29 – 2023-10-30 (×2): 6 via TOPICAL

## 2023-10-29 MED ORDER — ORAL CARE MOUTH RINSE: 15.0000 mL | OROMUCOSAL | Status: DC | PRN

## 2023-10-29 MED ORDER — SODIUM BICARBONATE 8.4 % IV SOLN
INTRAVENOUS | Status: DC
  Filled 2023-10-29: qty 150
  Filled 2023-10-29: qty 1000

## 2023-10-29 MED ORDER — LORAZEPAM 2 MG/ML IJ SOLN: 1.0000 mg | Freq: Once | INTRAMUSCULAR | Status: AC

## 2023-10-29 NOTE — Progress Notes (Signed)
 eLink Physician-Brief Progress Note Patient Name: Kim Anthony DOB: Nov 17, 1950 [name]: [id number]   Date of Service  10/29/2023  HPI/Events of Note  INR 8.6, up from 6.3, patient has a mechanical valve and pharmacy is managing INR. No evidence of active bleeding.  eICU Interventions  I checked with pharmacy to confirm that they are trending and managing the INR.        Kim Anthony 10/29/2023, 4:23 AM

## 2023-10-29 NOTE — ED Notes (Signed)
 Patient's significant other at bedside.

## 2023-10-29 NOTE — Progress Notes (Signed)
 Daughter was updated on Kim Anthony change in care to the ICU. She did not have questions at this time.

## 2023-10-29 NOTE — Progress Notes (Signed)
Evaluation and management procedures were performed by the Clinical Pharmacy Practitioner under my supervision and collaboration. I have reviewed the Practitioner's note and chart, and I agree with the management and plan as documented above. ° °

## 2023-10-29 NOTE — IPAL (Addendum)
  Interdisciplinary Goals of Care Family Meeting   Date carried out: 10/29/2023  Location of the meeting: Unit  Member's involved: Nurse Practitioner, Bedside Registered Nurse, and Family Member or next of kin  Durable Power of Attorney or acting medical decision maker: Shared among daughter and son      Discussion: Goals of care again discussed with daughter this afternoon and I confirmed that family has elected to precede with transition to comfort care only. Family has requested to continue the Milrinone  infusion for now as they are awaiting a few more family members to come visit. Currently patient is comfortable with PRN Morphine  and Ativan .   Code status:   Code Status: Do not attempt resuscitation (DNR) - Comfort care   Disposition: In-patient comfort care  Time spent for the meeting: 35 mims  Jenniah Bhavsar D. Harris, NP-C [facility] Personal contact information can be found on Amion  If no contact or response made please call [phone] 10/29/2023, 6:13 PM

## 2023-10-29 NOTE — Progress Notes (Signed)
 PHARMACY - ANTICOAGULATION CONSULT NOTE  Pharmacy Consult for warfarin  Indication: hx of mechanical mitral valve, remote PE   Allergies  Allergen Reactions   Lisinopril  Swelling   Acetaminophen -Codeine Itching   Propoxyphene Itching and Other (See Comments)    Darvocet   Tape Rash and Other (See Comments)    Plastic tape is not tolerated   Tramadol  Itching, Nausea And Vomiting and Rash    Patient Measurements: Height: 5\' 5"  (165.1 cm) Weight: 76.7 kg (169 lb 1.5 oz) IBW/kg (Calculated) : 57 HEPARIN  DW (KG): 70.4  Vital Signs: Temp: 98.4 F (36.9 C) (05/09 0747) Temp Source: Oral (05/09 0747) BP: 103/69 (05/09 1300) Pulse Rate: 72 (05/09 1300)  Labs: Recent Labs    10/28/23 0932 10/28/23 1058 10/28/23 1058 10/28/23 2006 10/29/23 0239 10/29/23 0251 10/29/23 0759  HGB  --  8.5*   < >  --  10.2* 8.2* 8.8*  HCT  --  30.5*   < >  --  30.0* 29.4* 26.0*  PLT  --  395  --   --   --  429*  --   LABPROT  --   --   --   --   --  71.1*  --   INR 6.3*  --   --   --   --  8.6*  --   CREATININE  --  2.97*  --  3.34*  --  3.87*  --   TROPONINIHS  --   --   --  30*  --   --   --    < > = values in this interval not displayed.    Estimated Creatinine Clearance: 13.1 mL/min (A) (by C-G formula based on SCr of 3.87 mg/dL (H)).   Medical History: Past Medical History:  Diagnosis Date   A-fib (HCC) 03/13/2022   Acute delirium 08/30/2023   Acute GI bleeding 08/18/2023   Acute idiopathic gout of multiple sites 09/30/2020   Acute on chronic hypoxic respiratory failure (HCC) 07/30/2023   Arthritis    Asthma    Breast cancer (HCC) 2001   Left Breast Cancer   CHF (congestive heart failure) (HCC)    Closed fracture of right hip (HCC) 09/23/2022   Closed intertrochanteric fracture of right femur, initial encounter (HCC) 09/22/2022   COPD (chronic obstructive pulmonary disease) (HCC)    Coronary artery disease    DVT (deep venous thrombosis) (HCC) 09/02/2023   GERD  (gastroesophageal reflux disease)    Headache, common migraine 07/11/2015   History of mitral valve prosthesis 07/31/2023   History of mitral valve replacement with mechanical valve    HLD (hyperlipidemia)    Hyperkalemia 10/08/2022   Hypertension    Hypomagnesemia 10/26/2023   Monoarthritis of wrist 10/18/2019   Osteoarthritis of left knee 04/24/2021   Pain around eye, bilateral 10/03/2023   Pain in right hand 01/16/2021   Personal history of breast cancer    Left Breast Cancer     Postmenopausal bleeding 04/21/2018   Endometrial biopsy negative 2019 no further workup advised at that time.     Pulmonary embolism (HCC) 2021   Pulmonary hypertension, low resistance (HCC) 08/20/2023   QT prolongation 08/18/2023   RVF (right ventricular failure) (HCC) 07/31/2023   Seronegative inflammatory arthritis 02/17/2021   Vaginal bleeding 04/01/2022   Wrist arthritis 07/23/2023   Assessment: Patient admitted w/ CC of respiratory distress and unable to catch breath. Hx of mechanical mitral valve, INR check this morning in IM clinic with INR of  6.3. Hgb 8.5 and PLTs 395. Taking warfarin 3mg  daily PTA - was instructed to hold today warfarin's dose while in IM clinic.   INR today of 8.6.   Goal of Therapy:  INR 2.5-3.5 Monitor platelets by anticoagulation protocol: Yes   Plan:  INR supra-therapeutic, hold today's warfarin dose.  Recheck INR in AM.   [name], PharmD, BCPS, BCCCP Clinical Pharmacist

## 2023-10-29 NOTE — Progress Notes (Signed)
 eLink Physician-Brief Progress Note Patient Name: Kim Anthony DOB: 03-23-1951 [name]: [id number]   Date of Service  10/29/2023  HPI/Events of Note  Patient with known history of CKD and AKI , heart failure with preserved ejection fraction admitted to the ICU for Milrinone  therapy for right heart failure.  eICU Interventions  New Patient Evaluation.        [name] [name] 10/29/2023, 5:35 AM

## 2023-10-29 NOTE — Consult Note (Signed)
 NAME:  Kim Anthony, [name]:  [id number], DOB:  Oct 11, 1950, LOS: 1 ADMISSION DATE:  10/28/2023, CONSULTATION DATE:  10/29/2023  REFERRING MD:  Jacklyn Mash, Cards, CHIEF COMPLAINT: Shortness of breath  History of Present Illness:  73 year old woman with recurrent admissions for acute diastolic heart failure.  She was recently admitted 4/29 to 5/6 for AKI on CKD stage IIIb and required CRRT for uremic encephalopathy.  She was also treated with 5 days of Zosyn  for parotid gland infection.  Discharge creatinine was 1.46 She presented to [facility] clinic complaining of shortness of breath for 2 days, vomiting x 1 and decreased urine with lightheadedness labs showed elevated INR 6.3, elevated BUN/creatinine 39/2.17 from baseline 1.5, BNP 1065 leukocytosis. She was sent to the ED for further evaluation Repeat labs showed hyperkalemia 5.6, worsening today, lactate of 8  Of note Farxiga  and losartan  allopurinol  have been held since prior hospitalization.  She remains on torsemide  40 mg daily  Seen by cardiology would like to admit to ICU for milrinone  infusion hence PCCM consulted  Pertinent  Medical History  HFpEF with right sided heart failure,  s/p mechanical mitral valve replacement (on warfarin),  CAD (CTO RCA on cath on 12/2020),  atrial fibrillation,  CKD3b, HTN,  T2DM,  Chronic hypoxic failure 2/2 COPD,  DVT/PE,   Significant Hospital Events: Including procedures, antibiotic start and stop dates in addition to other pertinent events     Interim History / Subjective:  Patient states dyspnea has improved on 3 L nasal cannula which is baseline.   Objective    Blood pressure 122/61, pulse 67, temperature 98.5 F (36.9 C), temperature source Oral, resp. rate 18, height 5\' 5"  (1.651 m), weight 70.4 kg, SpO2 99%.        Intake/Output Summary (Last 24 hours) at 10/29/2023 0115 Last data filed at 10/28/2023 2006 Gross per 24 hour  Intake 100 ml  Output --  Net 100 ml   Filed Weights   10/28/23  1712  Weight: 70.4 kg    Examination: General: Elderly woman, lying on her side in ED stretcher, no distress, on 3 L nasal cannula HENT: Mild pallor, no icterus, JVD+ Lungs: Right basilar crackles, no accessory muscle use Cardiovascular: Metallic S1, normal S2, no murmur Abdomen: Soft and nontender, no fluid thrill Extremities: No edema, no deformity Neuro: Awake, interactive, nonfocal   Labs showed mild leukocytosis, elevated BNP, elevated lactate 8/7, troponin 0  Chest x-ray shows cardiomegaly and patchy opacity in left  Resolved Hospital Problem list     Assessment & Plan:  Acute on chronic diastolic heart failure Elevated lactate concerning for cardiogenic shock, no evidence of sepsis, she was recently treated with 5 days of Zosyn  during hospitalisation, cultures were negative, she has mild leukocytosis and left-sided patchy infiltrates  - Cardiology would like to start milrinone , may have to do this via PIV , central access can be attempted once INR decreased, note that  right upper extremity PICC could not be placed on last admission - Empiric ceftriaxone   AKI on CKD -recently required CRRT, and renal recovery with baseline creatinine 1.5 on discharge Losartan  continues to be held Follow renal function and BMET Treat hyperkalemia Lasix  120 mg IV planned  Coagulopathy due to warfarin Mechanical mitral valve with INR goal 2.5-3.5 PAF  - Hold warfarin  Chronic hypoxic respiratory failure -O2 requirements at baseline COPD without exacerbation  - Triple neb therapy in lieu of home Trelegy   Best Practice (right click and "Reselect all SmartList Selections"  daily)   Diet/type: Regular consistency (see orders) DVT prophylaxis Coumadin  Pressure ulcer(s): N/A GI prophylaxis: N/A Lines: N/A Foley:  N/A Code Status:  limited Last date of multidisciplinary goals of care discussion [ this NA]  Labs   CBC: Recent Labs  Lab 10/23/23 0256 10/24/23 0452  10/25/23 0421 10/26/23 0628 10/28/23 1058  WBC 11.2* 11.4* 9.7 10.8* 11.4*  NEUTROABS  --   --   --   --  6.8  HGB 9.1* 8.8* 8.4* 8.3* 8.5*  HCT 29.9* 29.0* 28.2* 28.2* 30.5*  MCV 89.0 90.1 91.9 91.3 95.6  PLT 327 296 295 305 395    Basic Metabolic Panel: Recent Labs  Lab 10/23/23 0256 10/23/23 1821 10/24/23 0452 10/24/23 1655 10/25/23 0421 10/25/23 1546 10/26/23 0628 10/28/23 1058 10/28/23 2006 10/28/23 2030  NA 140   < > 137 134* 134* 134* 132* 135 133*  --   K 3.0*   < > 3.5 4.0 3.6 3.4* 3.5 4.4 5.6*  --   CL 105   < > 102 104 102 101 99 99 97*  --   CO2 25   < > 21* 19* 24 21* 24 20* 17*  --   GLUCOSE 103*   < > 84 107* 102* 116* 85 75 53*  --   BUN 16   < > 20 20 21  25* 27* 39* 40*  --   CREATININE 1.44*   < > 1.60* 1.50* 1.57* 1.51* 1.46* 2.97* 3.34*  --   CALCIUM  9.1   < > 9.3 9.1 9.0 9.2 9.0 9.6 9.4  --   MG 2.0  --  1.9  --  2.1  --  1.7  --   --  2.3  PHOS 4.3   < > 3.6 3.4 3.6 3.4 3.6  --   --   --    < > = values in this interval not displayed.   GFR: Estimated Creatinine Clearance: 14.6 mL/min (A) (by C-G formula based on SCr of 3.34 mg/dL (H)). Recent Labs  Lab 10/24/23 0452 10/25/23 0421 10/26/23 0628 10/28/23 1058 10/28/23 1811 10/28/23 2013 10/28/23 2030 10/28/23 2333  PROCALCITON  --   --   --   --   --   --  1.30  --   WBC 11.4* 9.7 10.8* 11.4*  --   --   --   --   LATICACIDVEN  --   --   --   --  8.0* 7.0*  --  8.9*    Liver Function Tests: Recent Labs  Lab 10/24/23 1655 10/25/23 0421 10/25/23 1546 10/26/23 0628 10/28/23 1058  AST  --   --   --   --  33  ALT  --   --   --   --  23  ALKPHOS  --   --   --   --  123  BILITOT  --   --   --   --  1.1  PROT  --   --   --   --  7.3  ALBUMIN 2.7* 2.7* 2.8* 2.7* 3.2*   No results for input(s): "LIPASE", "AMYLASE" in the last 168 hours. No results for input(s): "AMMONIA" in the last 168 hours.  ABG    Component Value Date/Time   PHART 7.345 (L) 01/09/2021 1341   PCO2ART 44.2  01/09/2021 1341   PO2ART 78 (L) 01/09/2021 1341   HCO3 19.4 (L) 10/19/2023 0605   TCO2 21 (L) 10/19/2023 0605   ACIDBASEDEF 9.0 (  H) 10/19/2023 0605   O2SAT 72 10/19/2023 0605     Coagulation Profile: Recent Labs  Lab 10/23/23 0256 10/24/23 0452 10/25/23 0421 10/26/23 0628 10/28/23 0932  INR 1.9* 2.3* 2.9* 3.2* 6.3*    Cardiac Enzymes: No results for input(s): "CKTOTAL", "CKMB", "CKMBINDEX", "TROPONINI" in the last 168 hours.  HbA1C: Hgb A1c MFr Bld  Date/Time Value Ref Range Status  08/18/2023 02:12 AM 6.0 (H) 4.8 - 5.6 % Final    Comment:    (NOTE) Pre diabetes:          5.7%-6.4%  Diabetes:              >6.4%  Glycemic control for   <7.0% adults with diabetes   09/21/2022 10:24 AM 5.5 4.8 - 5.6 % Final    Comment:    (NOTE)         Prediabetes: 5.7 - 6.4         Diabetes: >6.4         Glycemic control for adults with diabetes: <7.0     CBG: Recent Labs  Lab 10/26/23 0712 10/26/23 1129 10/28/23 2229 10/28/23 2303 10/29/23 0058  GLUCAP 120* 88 34* 111* 138*    Review of Systems:   Constitutional: negative for anorexia, fevers and sweats  Eyes: negative for irritation, redness and visual disturbance  Ears, nose, mouth, throat, and face: negative for earaches, epistaxis, nasal congestion and sore throat  Respiratory: negative for cough,  sputum and wheezing  Cardiovascular: negative for chest pain, orthopnea, palpitations and syncope  Gastrointestinal: negative for abdominal pain, constipation, diarrhea, melena, Genitourinary:negative for dysuria, frequency and hematuria  Hematologic/lymphatic: negative for bleeding, easy bruising and lymphadenopathy  Musculoskeletal:negative for arthralgias, muscle weakness and stiff joints  Neurological: negative for coordination problems, gait problems, headaches and weakness  Endocrine: negative for diabetic symptoms including polydipsia, polyuria and weight loss   Past Medical History:  She,  has a past medical  history of A-fib (HCC) (03/13/2022), Acute delirium (08/30/2023), Acute GI bleeding (08/18/2023), Acute idiopathic gout of multiple sites (09/30/2020), Acute on chronic hypoxic respiratory failure (HCC) (07/30/2023), Arthritis, Asthma, Breast cancer (HCC) (2001), CHF (congestive heart failure) (HCC), Closed fracture of right hip (HCC) (09/23/2022), Closed intertrochanteric fracture of right femur, initial encounter (HCC) (09/22/2022), COPD (chronic obstructive pulmonary disease) (HCC), Coronary artery disease, DVT (deep venous thrombosis) (HCC) (09/02/2023), GERD (gastroesophageal reflux disease), Headache, common migraine (07/11/2015), History of mitral valve prosthesis (07/31/2023), History of mitral valve replacement with mechanical valve, HLD (hyperlipidemia), Hyperkalemia (10/08/2022), Hypertension, Hypomagnesemia (10/26/2023), Monoarthritis of wrist (10/18/2019), Osteoarthritis of left knee (04/24/2021), Pain around eye, bilateral (10/03/2023), Pain in right hand (01/16/2021), Personal history of breast cancer, Postmenopausal bleeding (04/21/2018), Pulmonary embolism (HCC) (2021), Pulmonary hypertension, low resistance (HCC) (08/20/2023), QT prolongation (08/18/2023), RVF (right ventricular failure) (HCC) (07/31/2023), Seronegative inflammatory arthritis (02/17/2021), Vaginal bleeding (04/01/2022), and Wrist arthritis (07/23/2023).   Surgical History:   Past Surgical History:  Procedure Laterality Date   APPLICATION OF A-CELL OF CHEST/ABDOMEN Left 01/27/2019   Procedure: APPLICATION OF A-CELL OF CHEST;  Surgeon: Thornell Flirt, DO;  Location: [facility] OR;  Service: Plastics;  Laterality: Left;   APPLICATION OF WOUND VAC N/A 01/27/2019   Procedure: APPLICATION OF WOUND VAC;  Surgeon: Thornell Flirt, DO;  Location: [facility] OR;  Service: Plastics;  Laterality: N/A;   BREAST SURGERY Left 2001   for CA   CARDIOVERSION N/A 02/26/2022   Procedure: CARDIOVERSION;  Surgeon: Wendie Hamburg, MD;   Location[facility] ENDOSCOPY;  Service: Cardiovascular;  Laterality: N/A;   COLONOSCOPY WITH PROPOFOL  N/A 06/07/2018   Procedure: COLONOSCOPY WITH PROPOFOL ;  Surgeon: Baldo Bonds, MD;  Location: [facility] ENDOSCOPY;  Service: Endoscopy;  Laterality: N/A;   DEBRIDEMENT AND CLOSURE WOUND Left 12/28/2018   Procedure: Debridement And Closure Wound;  Surgeon: Oza Blumenthal, MD;  Location: [facility] OR;  Service: General;  Laterality: Left;   ESOPHAGOGASTRODUODENOSCOPY (EGD) WITH PROPOFOL  N/A 08/24/2023   Procedure: ESOPHAGOGASTRODUODENOSCOPY (EGD) WITH PROPOFOL ;  Surgeon: Renaye Carp, DO;  Location: [facility] ENDOSCOPY;  Service: Gastroenterology;  Laterality: N/A;   INCISION AND DRAINAGE OF WOUND Left 01/27/2019   Procedure: IRRIGATION AND DEBRIDEMENT OF CHEST WALL;  Surgeon: Thornell Flirt, DO;  Location: [facility] OR;  Service: Plastics;  Laterality: Left;   INTRAMEDULLARY (IM) NAIL INTERTROCHANTERIC N/A 09/23/2022   Procedure: INTRAMEDULLARY (IM) NAIL INTERTROCHANTERIC;  Surgeon: Wes Hamman, MD;  Location: [facility] OR;  Service: Orthopedics;  Laterality: N/A;   IR FLUORO GUIDE CV LINE RIGHT  10/01/2023   IR US  GUIDE VASC ACCESS RIGHT  10/01/2023   IR VENO/EXT/UNI RIGHT  10/05/2023   KNEE ARTHROSCOPY WITH MEDIAL MENISECTOMY Left 08/06/2021   Procedure: KNEE ARTHROSCOPY WITH PARTIAL MEDIAL MENISECTOMY, DEBRIDEMENT;  Surgeon: Orvan Blanch, MD;  Location: [facility] ORS;  Service: Orthopedics;  Laterality: Left;  1 HR   LATISSIMUS FLAP TO BREAST Left 01/18/2019   Procedure: LEFT LATISSIMUS FLAP TO BREAST;  Surgeon: Thornell Flirt, DO;  Location: [facility] OR;  Service: Plastics;  Laterality: Left;   MASTECTOMY Left    MITRAL VALVE REPLACEMENT     mechanical   POLYPECTOMY  06/07/2018   Procedure: POLYPECTOMY;  Surgeon: Baldo Bonds, MD;  Location: [facility] ENDOSCOPY;  Service: Endoscopy;;   RIGHT HEART CATH N/A 08/20/2023   Procedure: RIGHT HEART CATH;  Surgeon: Mardell Shade, MD;  Location: [facility] INVASIVE CV LAB;   Service: Cardiovascular;  Laterality: N/A;   RIGHT/LEFT HEART CATH AND CORONARY ANGIOGRAPHY N/A 01/09/2021   Procedure: RIGHT/LEFT HEART CATH AND CORONARY ANGIOGRAPHY;  Surgeon: Mardell Shade, MD;  Location: [facility] INVASIVE CV LAB;  Service: Cardiovascular;  Laterality: N/A;   WOUND DEBRIDEMENT Left 12/28/2018   Procedure: EXCISION OF LEFT CHRONIC CHEST WALL WOUND;  Surgeon: Oza Blumenthal, MD;  Location: [facility] OR;  Service: General;  Laterality: Left;     Social History:   reports that she has been smoking cigarettes. She has a 3 pack-year smoking history. She has never used smokeless tobacco. She reports current alcohol use of about 3.0 standard drinks of alcohol per week. She reports that she does not use drugs.   Family History:  Her family history includes Breast cancer (age of onset: 48) in her maternal aunt; Cancer in her father; Heart attack in an other family member; Hypertension in her father and mother.   Allergies Allergies  Allergen Reactions   Lisinopril  Swelling   Acetaminophen -Codeine Itching   Propoxyphene Itching and Other (See Comments)    Darvocet   Tape Rash and Other (See Comments)    Plastic tape is not tolerated   Tramadol  Itching, Nausea And Vomiting and Rash     Home Medications  Prior to Admission medications   Medication Sig Start Date End Date Taking? Authorizing Provider  albuterol  (VENTOLIN  HFA) 108 (90 Base) MCG/ACT inhaler Inhale 1 puff into the lungs every 4 (four) hours as needed for wheezing or shortness of breath. 09/14/23  Yes Juberg, Christopher, DO  colchicine  0.6 MG tablet Take 0.5 tablets (0.3 mg total) by mouth daily. 10/08/23  Yes Cleven Dallas,  DO  fluticasone  (FLONASE ) 50 MCG/ACT nasal spray Place 2 sprays into both nostrils daily. Patient taking differently: Place 1-2 sprays into both nostrils daily as needed for allergies or rhinitis. 05/17/23 05/16/24 Yes Amoako, Prince, MD  Fluticasone -Umeclidin-Vilant (TRELEGY ELLIPTA ) 100-62.5-25  MCG/ACT AEPB Inhale 1 puff into the lungs daily. 09/14/23  Yes Juberg, Veryl Gottron, DO  gabapentin  (NEURONTIN ) 100 MG capsule Take 2 capsules (200 mg total) by mouth 3 (three) times daily. 10/08/23  Yes Cleven Dallas, DO  HYDROcodone -acetaminophen  (NORCO/VICODIN) 5-325 MG tablet Take 1 tablet by mouth every 4 (four) hours as needed for moderate pain (pain score 4-6). Diagnosis- chronic low back pain 10/27/23  Yes Lovorn, Megan, MD  metoprolol  tartrate (LOPRESSOR ) 25 MG tablet Take 1 tablet (25 mg total) by mouth 2 (two) times daily. 10/26/23  Yes Sandie Cross, MD  potassium chloride  (KLOR-CON  M) 10 MEQ tablet Take 2 tablets (20 mEq total) by mouth daily. 09/07/23 04/04/24 Yes Amoako, Prince, MD  senna-docusate (SENOKOT-S) 8.6-50 MG tablet Take 1 tablet by mouth at bedtime as needed for mild constipation. Patient taking differently: Take 1 tablet by mouth every evening. 10/08/23  Yes Cleven Dallas, DO  simvastatin  (ZOCOR ) 20 MG tablet Take 1 tablet (20 mg total) by mouth daily. 12/03/22  Yes Allana Arabia, MD  spironolactone  (ALDACTONE ) 25 MG tablet Take 12.5 mg by mouth daily.   Yes [provider]  torsemide  (DEMADEX ) 20 MG tablet Take 2 tablets (40 mg total) by mouth daily. 10/27/23  Yes [name], MD  Vitamin D , Ergocalciferol , (DRISDOL ) 1.25 MG (50000 UNIT) CAPS capsule Take 1 capsule (50,000 Units total) by mouth every 7 (seven) days. Patient taking differently: Take 50,000 Units by mouth every Saturday. 10/11/23  Yes Cleven Dallas, DO  warfarin (COUMADIN ) 3 MG tablet Take 1 tablet (3 mg total) by mouth daily at 12 noon. Follow up with Dr. Margit Shelling Patient taking differently: Take 0-3 mg by mouth See admin instructions. Take 1.5 mg by mouth in the evening on Sun, NOTHING on Mon/Tues/Wed/Thurs/Fri, and 3 mg on Sat- INR to be re-checked at the doctor's discretion 10/26/23  Yes [name], MD  allopurinol  (ZYLOPRIM ) 100 MG tablet Take 0.5 tablets (50 mg total) by mouth every other  day. Patient not taking: Reported on 10/19/2023 10/13/23   Malen Scudder, DO  FARXIGA  10 MG TABS tablet Take 1 tablet by mouth once daily 08/31/23   Amoako, Prince, MD  losartan  (COZAAR ) 25 MG tablet Take 0.5 tablets (12.5 mg total) by mouth daily. 08/10/23 11/08/23  Elmarie Hacking, FNP      Celene Coins MD. FCCP. [facility] Pager : [phone]  If no response to pager , please call [phone] until 7 pm After 7:00 pm call Elink  [phone]   10/29/2023

## 2023-10-29 NOTE — Progress Notes (Signed)
 NAME:  Kim Anthony, [name]:  [id number], DOB:  29-Jun-1950, LOS: 1 ADMISSION DATE:  10/28/2023, CONSULTATION DATE:  10/29/2023  REFERRING MD:  Jacklyn Mash, Cards, CHIEF COMPLAINT: Shortness of breath  History of Present Illness:  73 year old woman with recurrent admissions for acute diastolic heart failure.  She was recently admitted 4/29 to 5/6 for AKI on CKD stage IIIb and required CRRT for uremic encephalopathy.  She was also treated with 5 days of Zosyn  for parotid gland infection.  Discharge creatinine was 1.46 She presented to [facility] clinic complaining of shortness of breath for 2 days, vomiting x 1 and decreased urine with lightheadedness labs showed elevated INR 6.3, elevated BUN/creatinine 39/2.17 from baseline 1.5, BNP 1065 leukocytosis. She was sent to the ED for further evaluation Repeat labs showed hyperkalemia 5.6, worsening today, lactate of 8  Of note Farxiga  and losartan  allopurinol  have been held since prior hospitalization.  She remains on torsemide  40 mg daily  Seen by cardiology would like to admit to ICU for milrinone  infusion hence PCCM consulted  Pertinent  Medical History  HFpEF with right sided heart failure,  s/p mechanical mitral valve replacement (on warfarin),  CAD (CTO RCA on cath on 12/2020),  atrial fibrillation,  CKD3b, HTN,  T2DM,  Chronic hypoxic failure 2/2 COPD,  DVT/PE,   Significant Hospital Events: Including procedures, antibiotic start and stop dates in addition to other pertinent events   2/9 Admitted overnight with complaints of shortness of breath and AMS admitted for recurrent decompensated right heart failure. Increased agitation/confusion this a.m.  Interim History / Subjective:  Able to state name but significantly encephalopathic.  Thrashing around in bed  Objective    Blood pressure (!) 144/55, pulse 78, temperature 98.1 F (36.7 C), temperature source Oral, resp. rate (!) 25, height 5\' 5"  (1.651 m), weight 76.7 kg, SpO2 (!) 89%.    FiO2  (%):  [50 %] 50 % PEEP:  [5 cmH20] 5 cmH20 Pressure Support:  [10 cmH20] 10 cmH20   Intake/Output Summary (Last 24 hours) at 10/29/2023 4098 Last data filed at 10/29/2023 0700 Gross per 24 hour  Intake 294.28 ml  Output 0 ml  Net 294.28 ml   Filed Weights   10/28/23 1712 10/29/23 0500  Weight: 70.4 kg 76.7 kg    Examination: General: Acute on chronic ill-appearing deconditioned elderly female lying in no acute distress HEENT: [place]/AT, MM pink/moist, PERRL,  Neuro: Able to state name significantly encephalopathic CV: s1s2 regular rate and rhythm, no murmur, rubs, or gallops,  PULM: Diminished air entry bilaterally, slightly tachypneic, not tolerating BiPAP GI: soft, bowel sounds active in all 4 quadrants, non-tender, non-distended Extremities: warm/dry, generalized nonpitting edema  Skin: no rashes or lesions   Resolved Hospital Problem list     Assessment & Plan:  Cardiogenic shock with acute on chronic diastolic heart failure  Persistent lactic acidosis  Severe TR CAD -Elevated lactate concerning for cardiogenic shock, no evidence of sepsis, she was recently treated with 5 days of Zosyn  during hospitalisation, cultures were negative, she has mild leukocytosis and left-sided patchy infiltrates P: Ongoing goals of care discussion per heart failure Continue Milrinone  per PIV  Continuous telemetry  Strict intake and output  Daily weight to assess volume status Daily assessment for need to diurese Closely monitor renal function and electrolytes  Provide supplemtal oxygen  as needed NIV with BIPAP as tolerated  Ensure hemodynamic control GDMT as able   Chronic hypoxic respiratory failure -O2 requirements at baseline COPD without exacerbation Pulmonary hypertension  Concern for HCAP  -Mild leukocytosis and left-sided patchy infiltrates P: Continue supplemental oxygen  as needed  Head of bed elevated 30 degrees Follow intermittent chest x-ray and ABG  Ensure adequate  pulmonary hygiene  Follow cultures  Continue empiric Ceftriaxone   Triple neb therapy   AKI on CKD with acute concern for cardiorenal syndrome  -recently required CRRT, and renal recovery with baseline creatinine 1.5 on discharge Hyperkalemia  P: Nephrology consulted  Follow renal function  Monitor urine output Trend Bmet Avoid nephrotoxins Ensure adequate renal perfusion  Hold home Losartan    Coagulopathy due to warfarin Mechanical mitral valve with INR goal 2.5-3.5 PAF P: Warfarin on hold  Trend INR  Monitor for signs of bleeding   Protein calorie malnutrition  Hypoalbuminemia P: Supplement nutrition as able Optimize protein  Goals of care Ongoing goals of care discussion with family this a.m., currently awaiting daughters arrival.  Patient appears very uncomfortable likely secondary to  ongoing poor perfusion secondary to cardiogenic shock.   Best Practice (right click and "Reselect all SmartList Selections" daily)   Diet/type: Regular consistency (see orders) DVT prophylaxis Coumadin  Pressure ulcer(s): N/A GI prophylaxis: N/A Lines: N/A Foley:  N/A Code Status:  limited Last date of multidisciplinary goals of care discussion [ this NA]   CRITICAL CARE Performed by: Luverta Korte D. Harris   Total critical care time: 42 minutes  Critical care time was exclusive of separately billable procedures and treating other patients.  Critical care was necessary to treat or prevent imminent or life-threatening deterioration.  Critical care was time spent personally by me on the following activities: development of treatment plan with patient and/or surrogate as well as nursing, discussions with consultants, evaluation of patient's response to treatment, examination of patient, obtaining history from patient or surrogate, ordering and performing treatments and interventions, ordering and review of laboratory studies, ordering and review of radiographic studies, pulse oximetry  and re-evaluation of patient's condition.  Dorlene Footman D. Harris, NP-C [facility] Personal contact information can be found on Amion  If no contact or response made please call [phone] 10/29/2023, 7:57 AM

## 2023-10-29 NOTE — Progress Notes (Addendum)
 Advanced Heart Failure Rounding Note  Cardiologist: Jules Oar, MD  Chief Complaint: Acute on Chronic RV Failure w/ Shock   Patient Profile   Kim Anthony is a 73 y.o. female with with a hx of right sided heart failure,  s/p mechanical mitral valve replacement (on warfarin), CAD (CTO RCA on cath on 12/2020), atrial fibrillation, pulmonary HTN, CKD3b, HTN, HLD, T2DM,  Chronic hypoxic failure 2/2 COPD, DVT/PE and multiple admissions in the last 6 months for a/c CHF requiring milrinone  support, now readmitted w/ recurrent a/c CHF w/ shock/ profound lactic acidosis requiring ICU admission and milrinone .    Subjective:    Critically ill. Delirious/Agitated, fighting BiPAP. DNR/DNI. O2 sats fluctuating mid 80s-low 90s. She is unable to contribute to history.   On Milrinone  0.25 + bicarb gtt w/ persistent lactic acidosis, 8.0>>7.0>>8.9>>8.3. Anuric despite 120 mg IV Lasix . SCr 3.87 (b/1 1.5), BUN 45. NH3 not checked on admit. K 5.6   No family present at bedside but daughter notified overnight. Palliative care team consulted.   Objective:   Weight Range: 76.7 kg Body mass index is 28.14 kg/m.   Vital Signs:   Temp:  [96.7 F (35.9 C)-98.5 F (36.9 C)] 98.1 F (36.7 C) (05/09 0305) Pulse Rate:  [55-139] 79 (05/09 0545) Resp:  [14-24] 19 (05/09 0545) BP: (75-185)/(45-127) 103/55 (05/09 0545) SpO2:  [81 %-100 %] 92 % (05/09 0545) FiO2 (%):  [50 %] 50 % (05/09 0637) Weight:  [70.4 kg-76.7 kg] 76.7 kg (05/09 0500)    Weight change: Filed Weights   10/28/23 1712 10/29/23 0500  Weight: 70.4 kg 76.7 kg    Intake/Output:   Intake/Output Summary (Last 24 hours) at 10/29/2023 0713 Last data filed at 10/29/2023 0500 Gross per 24 hour  Intake 186.26 ml  Output 0 ml  Net 186.26 ml      Physical Exam    General:  Critically ill/agitated, respiratory distress and refusing bipap  HEENT: Normal Neck: JVD to jaw Cor:  Regular rate & rhythm. 3/6 TR murmur  Lungs: decreased BS  bilaterally/anteriorly  Abdomen: Soft, +distended.  Extremities: No cyanosis, clubbing, rash, 1+ b/l LEE edema Neuro: delirious/ agitated    Telemetry   NSR 74 bpm, personally reviewed   EKG    NSr 74 bpm, personally reivewed   Labs    CBC Recent Labs    10/28/23 1058 10/29/23 0239 10/29/23 0251  WBC 11.4*  --  8.9  NEUTROABS 6.8  --   --   HGB 8.5* 10.2* 8.2*  HCT 30.5* 30.0* 29.4*  MCV 95.6  --  97.0  PLT 395  --  429*   Basic Metabolic Panel Recent Labs    16/10/96 2006 10/28/23 2030 10/29/23 0239 10/29/23 0251  NA 133*  --  131* 134*  K 5.6*  --  5.9* 5.9*  CL 97*  --   --  96*  CO2 17*  --   --  18*  GLUCOSE 53*  --   --  99  BUN 40*  --   --  45*  CREATININE 3.34*  --   --  3.87*  CALCIUM  9.4  --   --  9.4  MG  --  2.3  --  2.4  PHOS  --   --   --  8.0*   Liver Function Tests Recent Labs    10/28/23 1058 10/29/23 0251  AST 33 55*  ALT 23 26  ALKPHOS 123 122  BILITOT 1.1 1.3*  PROT  7.3 7.0  ALBUMIN 3.2* 3.0*   No results for input(s): "LIPASE", "AMYLASE" in the last 72 hours. Cardiac Enzymes No results for input(s): "CKTOTAL", "CKMB", "CKMBINDEX", "TROPONINI" in the last 72 hours.  BNP: BNP (last 3 results) Recent Labs    09/30/23 0005 10/19/23 0115 10/28/23 1710  BNP 723.7* 911.0* 1,065.0*    ProBNP (last 3 results) No results for input(s): "PROBNP" in the last 8760 hours.   D-Dimer No results for input(s): "DDIMER" in the last 72 hours. Hemoglobin A1C No results for input(s): "HGBA1C" in the last 72 hours. Fasting Lipid Panel No results for input(s): "CHOL", "HDL", "LDLCALC", "TRIG", "CHOLHDL", "LDLDIRECT" in the last 72 hours. Thyroid  Function Tests No results for input(s): "TSH", "T4TOTAL", "T3FREE", "THYROIDAB" in the last 72 hours.  Invalid input(s): "FREET3"  Other results:   Imaging    DG Chest Port 1 View Result Date: 10/28/2023 CLINICAL DATA:  Shortness of breath EXAM: PORTABLE CHEST 1 VIEW COMPARISON:  Chest  x-ray 10/19/2023 a FINDINGS: The heart is enlarged, unchanged. Sternotomy wires and prosthetic heart valve are again seen. There is some mild patchy opacities in the left lateral lung. There is no pleural effusion or pneumothorax. No acute fractures are seen. IMPRESSION: 1. Mild patchy opacities in the left lateral lung, which may represent atelectasis or infection. 2. Stable cardiomegaly. Electronically Signed   By: Tyron Gallon M.D.   On: 10/28/2023 18:31     Medications:     Scheduled Medications:  arformoterol   15 mcg Nebulization BID   budesonide  (PULMICORT ) nebulizer solution  0.5 mg Nebulization BID   Chlorhexidine  Gluconate Cloth  6 each Topical Daily   revefenacin   175 mcg Nebulization Daily   senna-docusate  1 tablet Oral QPM   simvastatin   20 mg Oral q1800   Warfarin - Pharmacist Dosing Inpatient   Does not apply q1600    Infusions:  milrinone  0.25 mcg/kg/min (10/29/23 0500)   sodium bicarbonate  150 mEq in dextrose  5 % 1,150 mL infusion 50 mL/hr at 10/29/23 0500    PRN Medications: albuterol , HYDROcodone -acetaminophen , mouth rinse    Assessment/Plan   Acute on chronic diastolic CHF with Prominent RV dysfunction (End-Stage), Cardiogenic Shock Persistent Lactic Acidosis  Severe TR AKI on CKDIIIB/Cardiorenal Syndrome now Anuric Acute on Chronic Hypoxic Respiratory Failure/COPD (DNI)  Pulmonary HTN  Mechanical Mitral Valve on Chronic Coumadin  w/ Supra Therapeutic INR in Setting of RV Failure  CAD  PAF    Kim Anthony is a 73 y.o. female with with a hx of right sided heart failure,  s/p mechanical mitral valve replacement (on warfarin), CAD (CTO RCA on cath on 12/2020), atrial fibrillation, pulmonary HTN, CKD3b, HTN, HLD, T2DM,  Chronic hypoxic failure 2/2 COPD, DVT/PE and multiple admissions in the last 6 months for a/c CHF requiring milrinone  support, now readmitted w/ recurrent a/c CHF w/ shock/ profound lactic acidosis requiring ICU admission and milrinone . She c/w  persistent lactic acidosis (8.3) and MSOF. Now anuric/uremic. Refusing BiPAP. She is end-stage RV failure. She is now DNR/DNI.   I spoke w/ daughter over the phone. She is en route to the hospital. Will recommend transition to comfort care. Palliative care team has been consulted. D/w PCCM at bedside, who also agrees.  PCCM team will notify me when patient's daughter arrives.     Length of Stay: 1  Kim Simmons, PA-C  10/29/2023, 7:13 AM  Advanced Heart Failure Team Pager [phone] (M-F; 7a - 5p)  Please contact [facility] for night-coverage after hours (5p -7a )  and weekends on [email or web address]  Agree with above.   Patient well known to me 73 y/o woman s/p MVR with multiple recent admits for end-stage RV failure requirng inotropic support   Now readmitted with cardiogenic shock. Back on milrinone . Persistent lactic acidosis with worsening renal failure  She is encepholpathic and agitated  General: Terminally ill agitated HEENT: normal Neck: supple. JVP to ear  Cor: Reg mech s1 3/6 TR Lungs: clear Abdomen: soft, nontender, + distended. No hepatosplenomegaly. No bruits or masses. Good bowel sounds. Extremities: no cyanosis, clubbing, rash, 1+ edema cool  Neuro:agitated. Encepholpathic  She is terminally ill with end-stage RV failure and MSOF. Unfortunately we do not have any durable therapies for her. Agree with transition to comfort care,  CRITICAL CARE Performed by: Jules Oar  Total critical care time: 55 minutes  Critical care time was exclusive of separately billable procedures and treating other patients.  Critical care was necessary to treat or prevent imminent or life-threatening deterioration.  Critical care was time spent personally by me (independent of midlevel providers or residents) on the following activities: development of treatment plan with patient and/or surrogate as well as nursing, discussions with consultants, evaluation of patient's response to  treatment, examination of patient, obtaining history from patient or surrogate, ordering and performing treatments and interventions, ordering and review of laboratory studies, ordering and review of radiographic studies, pulse oximetry and re-evaluation of patient's condition.  Jules Oar, MD  8:03 PM

## 2023-10-29 NOTE — Progress Notes (Signed)
 Internal Medicine Clinic Attending  Case discussed with the resident at the time of the visit.  We reviewed the resident's history and exam and pertinent patient test results.  I agree with the assessment, diagnosis, and plan of care documented in the resident's note.

## 2023-10-29 NOTE — Progress Notes (Signed)
 eLink Physician-Brief Progress Note Patient Name: Kim Anthony DOB: [birth year] [name]: [id number]   Date of Service  10/29/2023  HPI/Events of Note  PH 7.14, PCO2 54, HCO3 18.6 on VBG, metabolic component being addressed by Bicarb gtt.  eICU Interventions  Will order BIPAP to try to optimize the respiratory component.        Dathan Attia U Pailynn Vahey 10/29/2023, 5:53 AM

## 2023-10-29 NOTE — Plan of Care (Signed)
     Referral received for Kim Anthony: goals of care discussion. Chart reviewed and updates received from CCM NP Deneise Finlay. Patient's children have met with CCM to discuss prognosis and goals of care. Family waiting to discuss goals of care further with HF team. PMT to hold off on goals of care discussion at this time. CCM to reach out to PMT with needs.  Thank you for your referral and allowing PMT to assist in Kim Anthony care.   Joaquim Muir, NP Palliative Medicine Team  Team Phone # [phone]   NO CHARGE

## 2023-11-01 ENCOUNTER — Encounter: Admitting: Internal Medicine

## 2023-11-02 LAB — CULTURE, BLOOD (ROUTINE X 2)
Culture: NO GROWTH
Culture: NO GROWTH

## 2023-11-10 ENCOUNTER — Encounter: Admitting: Physical Medicine and Rehabilitation

## 2023-11-11 ENCOUNTER — Telehealth: Payer: Self-pay | Admitting: *Deleted

## 2023-11-11 NOTE — Telephone Encounter (Signed)
 I called [employer], spoke to Sammuel Crimes, who stated the death certificate was signed yesterday by Dr Joe Murders. Stated the daughter, [name], can go to the Register of Deeds if she's needs them now or wait until they have received them (then they will mail them to the daughter). And Perla Bradford stated she will call [name] to let know.

## 2023-11-11 NOTE — Telephone Encounter (Signed)
 Copied from CRM [id number]. Topic: General - Deceased Patient >> November 22, 2023  2:37 PM Shelby Dessert H wrote: Patient daughter called they need someone to sign off on death certificate. This is her 3rd attempt trying to get this task completed. [employer] [phone]. Needs to be signed asap. Patients daughter [name] callback number [phone]. She states that's its holding up everything going on with the patient as far as burial, insurance and etc.

## 2023-11-21 NOTE — Death Summary Note (Signed)
   Death Summary   Kim Anthony ZOX:[id number] DOB: 01/04/1951 DOA: Nov 11, 2023  PCP: [name], MD  Admit date: 2023/11/11 Date of Death: Nov 13, 2023  Final Diagnoses:  Active Problems:   Essential hypertension   COPD (chronic obstructive pulmonary disease) (HCC)   Chronic anticoagulation   Supratherapeutic INR on Coumadin     H/O mitral valve replacement with mechanical valve   Chronic respiratory failure (HCC)   Supratherapeutic INR   AKI (acute kidney injury) (HCC)   CKD-3b   Cardiorenal syndrome   Lactic acidosis   High anion gap metabolic acidosis   Cardiogenic shock with right heart failure and severe TR,   diastolic heart failure    CAD   Afib   Pulmonary hypertension    Hyperkalemia    HypoNa   Protein calorie malnutrition    DNR/DNI  History of present illness:    Hospital Course:  73 year old woman with recurrent admissions for acute diastolic heart failure. She was recently admitted 4/29 to 5/6 for AKI on CKD stage IIIb and required CRRT for uremic encephalopathy.  She was also treated with 5 days of Zosyn  for parotid gland infection.  Discharge creatinine was 1.46. She presented to [facility] clinic complaining of shortness of breath for 2 days, vomiting x 1 and decreased urine with lightheadedness labs showed elevated INR 6.3, elevated BUN/creatinine 39/2.17 from baseline 1.5, BNP 1065 leukocytosis. She was sent to the ED for further evaluation Repeat labs showed hyperkalemia 5.6, worsening today, lactate of 8 Of note Farxiga  and losartan  allopurinol  have been held since prior hospitalization.  She remains on torsemide  40 mg daily Seen by cardiology would like to admit to ICU for milrinone  infusion hence PCCM consulted.  She was admitted to ICU and started on Milrinone  infusion and seen by HF team. Her shock and metabolic derangements continued to worsen, and daughters were approached for comfort care as a goal. She was placed on comfort care protocol and expired 1431  today.    Signed:  Madelynn Schilder, MD [place] Pager: see AMION  Nov 13, 2023, 6:05 PM

## 2023-11-21 DEATH — deceased

## 2023-12-01 ENCOUNTER — Other Ambulatory Visit: Payer: Self-pay | Admitting: Student

## 2023-12-01 DIAGNOSIS — I5042 Chronic combined systolic (congestive) and diastolic (congestive) heart failure: Secondary | ICD-10-CM

## 2024-02-23 ENCOUNTER — Encounter
